# Patient Record
Sex: Male | Born: 1950 | Race: Black or African American | Hispanic: No | Marital: Married | Smoking: Former smoker
Health system: Southern US, Community
[De-identification: ages and names within clinical notes are randomized; demographics above are authoritative.]

## PROBLEM LIST (undated history)

## (undated) DIAGNOSIS — M869 Osteomyelitis, unspecified: Secondary | ICD-10-CM

## (undated) DIAGNOSIS — E11621 Type 2 diabetes mellitus with foot ulcer: Secondary | ICD-10-CM

## (undated) DIAGNOSIS — L089 Local infection of the skin and subcutaneous tissue, unspecified: Secondary | ICD-10-CM

## (undated) DIAGNOSIS — H409 Unspecified glaucoma: Secondary | ICD-10-CM

## (undated) DIAGNOSIS — I959 Hypotension, unspecified: Secondary | ICD-10-CM

## (undated) DIAGNOSIS — R519 Headache, unspecified: Secondary | ICD-10-CM

## (undated) DIAGNOSIS — E1142 Type 2 diabetes mellitus with diabetic polyneuropathy: Secondary | ICD-10-CM

## (undated) DIAGNOSIS — L97509 Non-pressure chronic ulcer of other part of unspecified foot with unspecified severity: Secondary | ICD-10-CM

## (undated) DIAGNOSIS — H269 Unspecified cataract: Secondary | ICD-10-CM

## (undated) DIAGNOSIS — I10 Essential (primary) hypertension: Secondary | ICD-10-CM

## (undated) DIAGNOSIS — I429 Cardiomyopathy, unspecified: Secondary | ICD-10-CM

## (undated) DIAGNOSIS — R251 Tremor, unspecified: Secondary | ICD-10-CM

## (undated) DIAGNOSIS — R51 Headache: Secondary | ICD-10-CM

## (undated) DIAGNOSIS — N186 End stage renal disease: Secondary | ICD-10-CM

## (undated) DIAGNOSIS — E11628 Type 2 diabetes mellitus with other skin complications: Secondary | ICD-10-CM

## (undated) DIAGNOSIS — I739 Peripheral vascular disease, unspecified: Secondary | ICD-10-CM

## (undated) HISTORY — DX: Peripheral vascular disease, unspecified: I73.9

## (undated) HISTORY — PX: OTHER SURGICAL HISTORY: SHX169

## (undated) HISTORY — PX: CHOLECYSTECTOMY: SHX55

## (undated) HISTORY — PX: EYE SURGERY: SHX253

## (undated) HISTORY — DX: Non-pressure chronic ulcer of other part of unspecified foot with unspecified severity: L97.509

## (undated) HISTORY — DX: Type 2 diabetes mellitus with diabetic polyneuropathy: E11.42

## (undated) HISTORY — DX: Tremor, unspecified: R25.1

## (undated) HISTORY — DX: Type 2 diabetes mellitus with foot ulcer: E11.621

## (undated) HISTORY — DX: End stage renal disease: N18.6

## (undated) HISTORY — DX: Cardiomyopathy, unspecified: I42.9

## (undated) HISTORY — PX: TONSILLECTOMY: SUR1361

---

## 1998-11-27 ENCOUNTER — Encounter: Admission: RE | Admit: 1998-11-27 | Discharge: 1999-02-25 | Payer: Self-pay | Admitting: Family Medicine

## 1999-12-08 ENCOUNTER — Encounter: Admission: RE | Admit: 1999-12-08 | Discharge: 2000-03-07 | Payer: Self-pay | Admitting: Orthopedic Surgery

## 2002-07-31 ENCOUNTER — Encounter (HOSPITAL_BASED_OUTPATIENT_CLINIC_OR_DEPARTMENT_OTHER): Admission: RE | Admit: 2002-07-31 | Discharge: 2002-10-29 | Payer: Self-pay | Admitting: Internal Medicine

## 2002-08-02 ENCOUNTER — Encounter (HOSPITAL_BASED_OUTPATIENT_CLINIC_OR_DEPARTMENT_OTHER): Payer: Self-pay | Admitting: Internal Medicine

## 2002-08-02 ENCOUNTER — Encounter: Admission: RE | Admit: 2002-08-02 | Discharge: 2002-08-02 | Payer: Self-pay | Admitting: Internal Medicine

## 2002-08-10 ENCOUNTER — Ambulatory Visit (HOSPITAL_COMMUNITY): Admission: RE | Admit: 2002-08-10 | Discharge: 2002-08-10 | Payer: Self-pay | Admitting: Internal Medicine

## 2002-08-10 ENCOUNTER — Encounter (HOSPITAL_BASED_OUTPATIENT_CLINIC_OR_DEPARTMENT_OTHER): Payer: Self-pay | Admitting: Internal Medicine

## 2004-05-27 ENCOUNTER — Ambulatory Visit (HOSPITAL_COMMUNITY): Admission: RE | Admit: 2004-05-27 | Discharge: 2004-05-27 | Payer: Self-pay | Admitting: Family Medicine

## 2004-08-05 ENCOUNTER — Encounter (HOSPITAL_BASED_OUTPATIENT_CLINIC_OR_DEPARTMENT_OTHER): Admission: RE | Admit: 2004-08-05 | Discharge: 2004-09-30 | Payer: Self-pay | Admitting: Internal Medicine

## 2004-08-05 ENCOUNTER — Encounter: Admission: RE | Admit: 2004-08-05 | Discharge: 2004-08-05 | Payer: Self-pay | Admitting: Internal Medicine

## 2004-10-28 ENCOUNTER — Encounter (HOSPITAL_BASED_OUTPATIENT_CLINIC_OR_DEPARTMENT_OTHER): Admission: RE | Admit: 2004-10-28 | Discharge: 2004-11-30 | Payer: Self-pay | Admitting: Internal Medicine

## 2005-01-20 ENCOUNTER — Encounter (HOSPITAL_BASED_OUTPATIENT_CLINIC_OR_DEPARTMENT_OTHER): Admission: RE | Admit: 2005-01-20 | Discharge: 2005-03-26 | Payer: Self-pay | Admitting: Internal Medicine

## 2005-11-25 ENCOUNTER — Encounter (HOSPITAL_BASED_OUTPATIENT_CLINIC_OR_DEPARTMENT_OTHER): Admission: RE | Admit: 2005-11-25 | Discharge: 2006-02-21 | Payer: Self-pay | Admitting: Surgery

## 2006-02-23 ENCOUNTER — Encounter (HOSPITAL_BASED_OUTPATIENT_CLINIC_OR_DEPARTMENT_OTHER): Admission: RE | Admit: 2006-02-23 | Discharge: 2006-03-01 | Payer: Self-pay | Admitting: Surgery

## 2008-05-24 ENCOUNTER — Ambulatory Visit (HOSPITAL_COMMUNITY): Admission: RE | Admit: 2008-05-24 | Discharge: 2008-05-24 | Payer: Self-pay | Admitting: Family Medicine

## 2008-06-24 ENCOUNTER — Encounter (INDEPENDENT_AMBULATORY_CARE_PROVIDER_SITE_OTHER): Payer: Self-pay | Admitting: General Surgery

## 2008-06-24 ENCOUNTER — Ambulatory Visit (HOSPITAL_COMMUNITY): Admission: RE | Admit: 2008-06-24 | Discharge: 2008-06-24 | Payer: Self-pay | Admitting: General Surgery

## 2010-08-11 ENCOUNTER — Encounter (HOSPITAL_BASED_OUTPATIENT_CLINIC_OR_DEPARTMENT_OTHER): Admission: RE | Admit: 2010-08-11 | Discharge: 2010-09-08 | Payer: Self-pay | Admitting: General Surgery

## 2010-08-12 ENCOUNTER — Ambulatory Visit (HOSPITAL_COMMUNITY): Admission: RE | Admit: 2010-08-12 | Discharge: 2010-08-12 | Payer: Self-pay | Admitting: General Surgery

## 2011-05-11 NOTE — Consult Note (Signed)
NAME:  [name], [name]               ACCOUNT NO.:  [id number]   MEDICAL RECORD NO.:  [id number]          PATIENT TYPE:  OUT   LOCATION:  XRAY                         FACILITY:  [facility]   PHYSICIAN:  Mare Loan, MD      DATE OF BIRTH:  [birth year]   DATE OF CONSULTATION:  05/24/2008  DATE OF DISCHARGE:                                 CONSULTATION   REASON FOR CONSULTATION:  Cholelithiasis, cholecystitis.   [name] [name] is a 60 year old male whom I am asked to see by [place].  The PA is Westly Pam.  [name] [name] apparently began having  abdominal pain last Saturday.  The pain was intermittent in nature,  mostly was located beneath his right scapula.  The pain did radiate  around to his right upper quadrant and epigastric region.  He believed  this to be a muscle strain and ignored this.  He then continued to have  worsening pain and over the past couple of days the pain intensified to  where he went to his physician for further examination.  He had had  associated nausea and vomiting and chills.  No scleral icterus and no  skin changes.  He has had no diarrhea.  He  has had some constipation  and difficulty passing flatus.  He has a history of gallstones in the  past and had seen Dr. Lennie Hummer.  Apparently had a repeat ultrasound  done today with gallbladder wall thickening, no pericholecystic fluid,  no ductal dilatation.  He had labs done with his primary care physician  but I do not have these available to me.   PAST MEDICAL HISTORY:  Significant for hypertension, diabetes, mild  anemia.   MEDICATIONS:  Include Actos as well as Prinivil, Januvia and Pepcid.   He does smoke tobacco and rarely has alcohol intake.   SURGICAL HISTORY:  None.   REVIEW OF SYSTEMS:  No cardiac disease.  No pulmonary disease.  No renal  disease.  He does have diabetes, for which he has seen an  endocrinologist in the past.   PHYSICAL EXAMINATION:  GENERAL:  He is a pleasant male, appears in  no  acute distress.  HEENT:  Extraocular muscles are intact.  Anicteric.  CHEST:  Clear to auscultation bilaterally.  CARDIOVASCULAR:  Shows a regular rate and rhythm.  ABDOMEN:  He is mildly distended.  He is somewhat tender in the right  upper quadrant but no peritoneal signs.  EXTREMITIES:  No deformity or edema noted.  SKIN:  I notice no rashes, no skin lesions.   ASSESSMENT/PLAN:  Cholelithiasis and likely cholecystitis.  I discussed  with [name] [name] that I could either admit him to the hospital with IV  fluids, IV antibiotics and pain control or we could try outpatient  management with narcotics and antibiotics orally.  He currently is in no  acute distress and would like to try conservative management first.  I  have given him a prescription for Cipro as well as Percocet, instructed  him if he does worsen and does not improve he should come  back to the  hospital.  My goal is to let some of the inflammation calm down.  Since  it has been greater than 72 hours since the onset of abdominal pain, I  think the likelihood of having to convert to an open procedure would be  more likely and I would like to give him a better chance at a  laparoscopic procedure, but he  understands if he does worsen or has more severe symptoms that we may  have to go ahead and perform a cholecystectomy, and therefore he will be  in touch with me if things worsen over the weekend, or he will call my  office to schedule an appointment as an outpatient Monday.      Mare Loan, MD  Electronically Signed     ALA/MEDQ  D:  05/24/2008  T:  05/24/2008  Job:  [id number]   cc:   [facility]

## 2011-05-11 NOTE — Op Note (Signed)
NAME:  [name], [name]               ACCOUNT NO.:  [id number]   MEDICAL RECORD NO.:  [id number]          PATIENT TYPE:  AMB   LOCATION:  DAY                          FACILITY:  [facility]   PHYSICIAN:  Mare Loan, MD      DATE OF BIRTH:  1951-11-07   DATE OF PROCEDURE:  06/24/2008  DATE OF DISCHARGE:                               OPERATIVE REPORT   PREOPERATIVE DIAGNOSES:  Chronic cholecystitis.  Cholelithiasis.   POSTOPERATIVE DIAGNOSES:  Chronic cholecystitis.  Cholelithiasis.   SURGEON:  Mare Loan, M.D.   ASSISTANT:  None.   ANESTHESIA:  General endotracheal anesthesia.   PROCEDURE:  Laparoscopic cholecystectomy.   SPECIMENS:  Gallbladder.   COMPLICATIONS:  None.   DRAINS:  No drains were placed.   INDICATIONS FOR PROCEDURE:  [name] is a 60 year old male whom I had  seen in May due to the acute onset of abdominal pain.  He had  cholecystitis, but I felt at that time he had missed his operative  window.  I placed him on oral antibiotics and scheduled him for a later  date to reduce the chance of an open procedure.  An informed consent was  obtained prior to the procedure.   DESCRIPTION OF PROCEDURE:  [name] was identified in the preoperative  holding area and received 2 grams of Mefoxin and was taken to the  operating room.  Once in the operating room, he was placed in the supine  position.  After the administration of general endotracheal anesthesia,  his abdomen was prepped and draped in the usual sterile fashion.  A time  out in the procedure indicated the patient was to have the procedure  performed and a supraumbilical incision was placed.  A supraumbilical  incision was placed.  The Veress needle was placed into the abdominal  cavity to obtain a pneumoperitoneum.  After establishment of the  pneumoperitoneum, I placed an 11 mm trocar using Opti-View.  There was  no evidence of injury upon placement of the Veress needle or the trocar.  I then placed three  additional 5 mm trocars, one in the epigastric  region, two in the right side of the abdomen under visualization with  the camera.  The gallbladder was visualized in its normal anatomic  position in the gallbladder fossa.  The fundus of the gallbladder was  retracted up towards the head of the patient.  I retracted the  infundibulum away from the liver bed.  Using Maryland forceps and hook  electrocautery in the left lower quadrant, I dissected the peritoneum at  the infundibulum.  I was able to obtain a critical view of the cystic  duct, cystic artery and liver bed.  Three clips were placed proximally  on the cystic duct, one distally.  It was transected with laparoscopic  scissors.  I also clipped and divided the cystic artery.  I then removed  the remaining peritoneal attachments with the help of electrocautery.  A  Ray-Tec was introduced into the abdominal cavity and gently swept away  the mild ooze at the liver bed.  Once the  gallbladder was removed, it  was placed in an Endo-Catch bag and removed from the abdomen.  I  inspected the liver bed.  A minimal amount of oozing was easily  controlled with electrocautery.  The Ray-Tec was then removed from the  abdomen.  In the infraumbilical incision the fascia was closed with #0  Vicryl suture.  The skin was closed with #4-0 Monocryl.  A DermaMend  dressing.   The patient was then extubated and transported to the post-anesthesia  unit in stable condition.  He will likely be discharged home later  today.  He will get Percocet #45 for pain.  He is instructed no heavy  lifting of greater than 20 pounds for approximately five weeks.  He is  to follow up with me in approximately two to three weeks' time.      Mare Loan, MD  Electronically Signed     ALA/MEDQ  D:  06/24/2008  T:  06/24/2008  Job:  [id number]   cc:   [facility]

## 2011-05-14 NOTE — Consult Note (Signed)
NAME:  [name], [name]                         ACCOUNT NO.:  [id number]   MEDICAL RECORD NO.:  [id number]                   PATIENT TYPE:  REC   LOCATION:  FOOT                                 FACILITY:  [facility]   PHYSICIAN:  Orlando Penner. Sevier, M.D.              DATE OF BIRTH:  08-06-1951   DATE OF CONSULTATION:  08/02/2002  DATE OF DISCHARGE:                            ENDOCRINOLOGY CONSULTATION   HISTORY:  This 60 year old white male has a history of approximately 10  years of type 2 diabetes, treated initially with Micronase then Glucophage  (which was poorly tolerated), and eventually at present only Actos.  Apparently his degree of control is dismal, with recent hemoglobin A1C 11.9  on July 20, 2002.   With that background history, the patient has had some callous formation on  his feet for some period of time, but this has not ever been particularly  problematic.  He has noted that his feet have markedly decreased sensation.  In July 2003 he apparently stumped his left great toe on a chair in the  bedroom, and subsequently came to find out that in fact the toe had been  fractured.  There was some apparent dorsal laceration at that time as well;  which became secondary to the fact that he required antibiotic treatment  over a period of time.  Subsequent to that he has had some deformity of the  toe and a plantar callous on that toe has gotten progressively worse.   He is here now for a general evaluation and advice.   PAST MEDICAL HISTORY:  1. Hypertension.  2. History of mild anemia.  3. Diabetes.   ALLERGIES:  PENICILLIN.   MEDICATIONS:  Lisinopril, Actos.   PHYSICAL EXAMINATION:  Examination today is limited to the distal lower  extremities.  His feet are grossly free of edema, and showed bilaterally  some early hammer toe configuration of the second through the fourth digits.  The hallux on the left is somewhat deformed, secondary to the previous  trauma -- being  slightly in a valgus position and lacking mobility in a  phalangeal joint.  The skin temperatures of the feet are normal on the  right, but approximately 3-4 degrees higher at the corresponding position  throughout the left foot; which is thought to be abnormal.  This foot feels  warm as well.  All pulses are palpable and quite adequate.  Monofilament  testing shows an absence of effective sensation over the toes and metatarsal  head bilaterally.   In the plantar aspect of the mid arch of the left foot are some discolored  and somewhat hardened areas, a bit flaky, that would appear to be indicative  of some degree of superficial fungus infection.  There is callous formation  at the tip of the right second toe at the right fifth and first metatarsal  head's plantar aspects.  There is major callous formation  at the tip of the  left hallux and underlying the proximal phalanx of the left hallux.  There  is an addition plantar callous formation at the first and fifth metatarsal  heads on the left.  Finally, on the dorsal lateral aspect at the base of the  fifth toe on the left, overlying the metatarsal phalangeal joint is again  callous formation.   DISPOSITION:  1. The patient is given general instruction regarding foot care, foot wear     and diabetes by video, with nursing potential reinforcement.  2. The patient is particularly advised that it is critical that he gets his     diabetes under better control, both to allow management of his current     foot problems and also to lessen his tendency toward problems in the     future.  3. I additionally explained to the patient that the warmth in his left foot     is a matter of considerable concern, this concern being he may well have     some persistent osteomyelitis from his bout of infection following his     January trauma; and/or that he may have the beginnings of a Charcot     process.  Certainly the osteomyelitis is somewhat more  suspect.  4. The callouses and all of their formation locations are sharply repaired     without incident.  5. The patient is given a recommendation to use Lamisil cream on the area of     fungal infection and the arch of the left foot.  6. The patient is given prescription for custom molded inserts; added with     recommendation that a mild exterior rocker be added in to prevent future     toe tip callouses and association of risks of progressive in her toes.  7. Finally, the patient is sent for x-ray of the left foot today.  If this     is unrevealing, a three-phase bone scan will be done.  8. Return visit is given to the patient for one month.                                               Orlando Penner. London Pepper, M.D.    RES/MEDQ  D:  08/02/2002  T:  08/06/2002  Job:  [id number]   cc:   Azalia Bilis

## 2011-08-10 ENCOUNTER — Inpatient Hospital Stay (HOSPITAL_COMMUNITY): Payer: Self-pay

## 2011-08-10 ENCOUNTER — Inpatient Hospital Stay (HOSPITAL_COMMUNITY)
Admission: AD | Admit: 2011-08-10 | Discharge: 2011-08-16 | DRG: 617 | Disposition: A | Discharge status: Home / Self Care | Payer: Self-pay | Source: Ambulatory Visit | Attending: Internal Medicine | Admitting: Internal Medicine

## 2011-08-10 DIAGNOSIS — I96 Gangrene, not elsewhere classified: Secondary | ICD-10-CM | POA: Diagnosis present

## 2011-08-10 DIAGNOSIS — J4489 Other specified chronic obstructive pulmonary disease: Secondary | ICD-10-CM | POA: Diagnosis present

## 2011-08-10 DIAGNOSIS — J449 Chronic obstructive pulmonary disease, unspecified: Secondary | ICD-10-CM | POA: Diagnosis present

## 2011-08-10 DIAGNOSIS — M869 Osteomyelitis, unspecified: Secondary | ICD-10-CM | POA: Diagnosis present

## 2011-08-10 DIAGNOSIS — E46 Unspecified protein-calorie malnutrition: Secondary | ICD-10-CM | POA: Diagnosis present

## 2011-08-10 DIAGNOSIS — L97509 Non-pressure chronic ulcer of other part of unspecified foot with unspecified severity: Secondary | ICD-10-CM | POA: Diagnosis present

## 2011-08-10 DIAGNOSIS — M7020 Olecranon bursitis, unspecified elbow: Secondary | ICD-10-CM | POA: Diagnosis present

## 2011-08-10 DIAGNOSIS — I10 Essential (primary) hypertension: Secondary | ICD-10-CM | POA: Diagnosis present

## 2011-08-10 DIAGNOSIS — L02619 Cutaneous abscess of unspecified foot: Secondary | ICD-10-CM | POA: Diagnosis present

## 2011-08-10 DIAGNOSIS — E1169 Type 2 diabetes mellitus with other specified complication: Principal | ICD-10-CM | POA: Diagnosis present

## 2011-08-10 DIAGNOSIS — E1142 Type 2 diabetes mellitus with diabetic polyneuropathy: Secondary | ICD-10-CM | POA: Diagnosis present

## 2011-08-10 DIAGNOSIS — IMO0002 Reserved for concepts with insufficient information to code with codable children: Principal | ICD-10-CM | POA: Diagnosis present

## 2011-08-10 DIAGNOSIS — M9080 Osteopathy in diseases classified elsewhere, unspecified site: Secondary | ICD-10-CM | POA: Diagnosis present

## 2011-08-10 DIAGNOSIS — E1149 Type 2 diabetes mellitus with other diabetic neurological complication: Secondary | ICD-10-CM | POA: Diagnosis present

## 2011-08-10 DIAGNOSIS — L03039 Cellulitis of unspecified toe: Secondary | ICD-10-CM | POA: Diagnosis present

## 2011-08-10 DIAGNOSIS — Z794 Long term (current) use of insulin: Secondary | ICD-10-CM

## 2011-08-10 DIAGNOSIS — D638 Anemia in other chronic diseases classified elsewhere: Secondary | ICD-10-CM | POA: Diagnosis present

## 2011-08-10 LAB — CBC
HCT: 31.7 % — ABNORMAL LOW (ref 39.0–52.0)
Hemoglobin: 10.6 g/dL — ABNORMAL LOW (ref 13.0–17.0)
MCH: 28.4 pg (ref 26.0–34.0)
MCHC: 33.4 g/dL (ref 30.0–36.0)
MCV: 85 fL (ref 78.0–100.0)

## 2011-08-10 LAB — RAPID URINE DRUG SCREEN, HOSP PERFORMED
Amphetamines: NOT DETECTED
Barbiturates: NOT DETECTED
Benzodiazepines: NOT DETECTED
Tetrahydrocannabinol: NOT DETECTED

## 2011-08-10 LAB — URINALYSIS, ROUTINE W REFLEX MICROSCOPIC
Ketones, ur: NEGATIVE mg/dL
Leukocytes, UA: NEGATIVE
Nitrite: NEGATIVE
pH: 5.5 (ref 5.0–8.0)

## 2011-08-10 LAB — LIPID PANEL
HDL: 29 mg/dL — ABNORMAL LOW (ref >39)
LDL Cholesterol: 106 mg/dL — ABNORMAL HIGH (ref 0–99)

## 2011-08-10 LAB — COMPREHENSIVE METABOLIC PANEL
ALT: 20 U/L (ref 0–53)
Alkaline Phosphatase: 74 U/L (ref 39–117)
CO2: 27 mEq/L (ref 19–32)
Chloride: 98 mEq/L (ref 96–112)
GFR calc Af Amer: >60 mL/min (ref >60)
GFR calc non Af Amer: 53 mL/min — ABNORMAL LOW (ref >60)
Glucose, Bld: 259 mg/dL — ABNORMAL HIGH (ref 70–99)
Potassium: 3.7 mEq/L (ref 3.5–5.1)
Sodium: 133 mEq/L — ABNORMAL LOW (ref 135–145)
Total Protein: 8.1 g/dL (ref 6.0–8.3)

## 2011-08-10 LAB — URINE MICROSCOPIC-ADD ON

## 2011-08-10 LAB — GLUCOSE, CAPILLARY: Glucose-Capillary: 179 mg/dL — ABNORMAL HIGH (ref 70–99)

## 2011-08-11 DIAGNOSIS — M869 Osteomyelitis, unspecified: Secondary | ICD-10-CM

## 2011-08-11 LAB — GLUCOSE, CAPILLARY
Glucose-Capillary: 203 mg/dL — ABNORMAL HIGH (ref 70–99)
Glucose-Capillary: 178 mg/dL — ABNORMAL HIGH (ref 70–99)

## 2011-08-11 LAB — DIFFERENTIAL
Basophils Relative: 0 % (ref 0–1)
Eosinophils Relative: 9 % — ABNORMAL HIGH (ref 0–5)
Lymphocytes Relative: 23 % (ref 12–46)
Monocytes Relative: 14 % — ABNORMAL HIGH (ref 3–12)
Neutro Abs: 4.8 10*3/uL (ref 1.7–7.7)

## 2011-08-11 LAB — CBC
Hemoglobin: 9.7 g/dL — ABNORMAL LOW (ref 13.0–17.0)
MCH: 28.2 pg (ref 26.0–34.0)
MCV: 84.9 fL (ref 78.0–100.0)
RBC: 3.44 MIL/uL — ABNORMAL LOW (ref 4.22–5.81)
WBC: 9 10*3/uL (ref 4.0–10.5)

## 2011-08-11 LAB — IRON AND TIBC
Iron: 19 ug/dL — ABNORMAL LOW (ref 42–135)
TIBC: 181 ug/dL — ABNORMAL LOW (ref 215–435)

## 2011-08-11 LAB — TSH: TSH: 1.184 u[IU]/mL (ref 0.350–4.500)

## 2011-08-11 LAB — BASIC METABOLIC PANEL
CO2: 27 mEq/L (ref 19–32)
Chloride: 102 mEq/L (ref 96–112)
Glucose, Bld: 146 mg/dL — ABNORMAL HIGH (ref 70–99)
Potassium: 3.6 mEq/L (ref 3.5–5.1)
Sodium: 136 mEq/L (ref 135–145)

## 2011-08-11 LAB — VITAMIN B12: Vitamin B-12: 997 pg/mL — ABNORMAL HIGH (ref 211–911)

## 2011-08-11 LAB — FOLATE: Folate: 16 ng/mL

## 2011-08-11 LAB — HEMOGLOBIN A1C: Mean Plasma Glucose: 260 mg/dL — ABNORMAL HIGH (ref <117)

## 2011-08-11 MED ORDER — GADOBENATE DIMEGLUMINE 529 MG/ML IV SOLN
15.0000 mL | Freq: Once | INTRAVENOUS | Status: AC
  Administered 2011-08-10: 15 mL via INTRAVENOUS

## 2011-08-12 DIAGNOSIS — M869 Osteomyelitis, unspecified: Secondary | ICD-10-CM

## 2011-08-12 DIAGNOSIS — M79609 Pain in unspecified limb: Secondary | ICD-10-CM

## 2011-08-12 DIAGNOSIS — Z01810 Encounter for preprocedural cardiovascular examination: Secondary | ICD-10-CM

## 2011-08-12 LAB — GLUCOSE, CAPILLARY: Glucose-Capillary: 150 mg/dL — ABNORMAL HIGH (ref 70–99)

## 2011-08-12 LAB — CBC
HCT: 30.8 % — ABNORMAL LOW (ref 39.0–52.0)
Hemoglobin: 10.1 g/dL — ABNORMAL LOW (ref 13.0–17.0)
MCH: 28 pg (ref 26.0–34.0)
MCV: 85.3 fL (ref 78.0–100.0)
Platelets: 272 10*3/uL (ref 150–400)
RBC: 3.27 MIL/uL — ABNORMAL LOW (ref 4.22–5.81)
RBC: 3.61 MIL/uL — ABNORMAL LOW (ref 4.22–5.81)
RDW: 13.9 % (ref 11.5–15.5)
WBC: 7.3 10*3/uL (ref 4.0–10.5)

## 2011-08-12 LAB — BASIC METABOLIC PANEL
CO2: 28 mEq/L (ref 19–32)
Calcium: 8.8 mg/dL (ref 8.4–10.5)
Calcium: 9.5 mg/dL (ref 8.4–10.5)
Creatinine, Ser: 1.07 mg/dL (ref 0.50–1.35)
GFR calc Af Amer: >60 mL/min (ref >60)
GFR calc non Af Amer: >60 mL/min (ref >60)
Glucose, Bld: 122 mg/dL — ABNORMAL HIGH (ref 70–99)
Sodium: 140 mEq/L (ref 135–145)

## 2011-08-12 LAB — VANCOMYCIN, TROUGH: Vancomycin Tr: 18.8 ug/mL (ref 10.0–20.0)

## 2011-08-13 ENCOUNTER — Inpatient Hospital Stay (HOSPITAL_COMMUNITY): Payer: Self-pay

## 2011-08-13 LAB — GLUCOSE, CAPILLARY
Glucose-Capillary: 120 mg/dL — ABNORMAL HIGH (ref 70–99)
Glucose-Capillary: 114 mg/dL — ABNORMAL HIGH (ref 70–99)

## 2011-08-13 NOTE — H&P (Signed)
NAMEMOBEEN, KUTZNER NO.:  [id number]  MEDICAL RECORD NO.:  [id number]  LOCATION:  [id number]                         FACILITY:  [facility]  PHYSICIAN:  Oren Binet, MD    DATE OF BIRTH:  [birth year]  DATE OF ADMISSION:  08/10/2011 DATE OF DISCHARGE:                             HISTORY & PHYSICAL   PRIMARY CARE PRACTITIONER:  Azalia Bilis, M.D.  PRIMARY ORTHOPEDIC DOCTOR:  [name], M.D.  CHIEF COMPLAINT:  Sent from Dr. [name]' office for left fourth toe osteomyelitis along with uncontrolled hypertension.  HISTORY OF PRESENT ILLNESS:  Mr. Latter is a very pleasant 60 year old black male with a past medical history of diabetes and hypertension who unfortunately has been noncompliant to his medications because of financial issues presented to the [facility] with the above- noted complaints.  Per the history obtained around 2-3 weeks ago, the patient hit his left foot and sustained a minor trauma.  There, are over the past few weeks, he has noticed that the foot has become more swollen and over the past few days, it has started to smell with a foul-smelling odor along with a blackish discoloration.  He presented to his primary care practitioner's office few days ago and was then referred to Dr. [name]' office for further evaluation.  In Dr. [name]' office the patient was found to have a blood pressure with the systolic in the [id number] and hence referred to the Medical Service for further optimization of his medical issues, so that the patient could undergo eventual amputation.  The patient also does give a history of subjective fever. However, does not complain of headache, nausea, vomiting, diarrhea, or abdominal pain.  ALLERGIES:  The patient is apparently allergic to PENICILLIN for which he gets hives and swells up.  PAST MEDICAL HISTORY:  Hypertension and diabetes.  PAST SURGICAL HISTORY: 1. Cholecystectomy. 2. Hysterectomy. 3. Right leg  grafting.  HOME MEDICATIONS:  The patient has not been taking any of his blood pressure medications or diabetic medications for more than a year now.  FAMILY HISTORY:  He has diabetes both in his mother and father.  SOCIAL HISTORY:  The patient lives with his wife and denies any toxic habits.  He used to smoke, but quit in the 1980s.  REVIEW OF SYSTEMS:  A detailed review of 12 systems was done and these are negative except for the ones noted in the HPI.  PHYSICAL EXAMINATION:  GENERAL:  Lying in bed, does not appear to be in any respiratory distress.  He is awake and alert, talking in full sentences.  Speech is clear. VITAL SIGNS:  Temperature of 99.6, heart rate of 104, respiration of 20, blood pressure of 194/76, and a pulse ox of 100% on room air. HEENT:  Atraumatic, normocephalic.  Pupils are equally reactive to light and accommodation. CHEST:  Bilaterally clear. CARDIOVASCULAR:  Heart sounds are regular.  No murmurs heard. ABDOMEN:  Soft, nontender, nondistended. EXTREMITIES:  No edema in his bilateral lower extremity.  However, his left fourth toe has purulent smell, has blackish discoloration.  It is mildly tender as well. NEUROLOGIC:  The patient is awake and alert and does not appear to  have any focal neurological deficits.  LABORATORY DATA: 1. CBC shows a WBC of 11.7, hemoglobin of 10.6, hematocrit of 31.7,     MCV of 85, and a platelet count of 263. 2. INR is 1.18. 3. Chemistry shows a sodium of 133, potassium of 3.7, chloride of 98,     bicarb of 27, glucose of 259, BUN of 18, creatinine of 1.37, and a     calcium of 9.1. 4. LFT shows a total bilirubin of 0.3, alkaline phosphatase of 74, AST     of 21, ALT of 20, total protein of 8.1, and an albumin of 2.8. 5. ESR is 106.  RADIOLOGICAL STUDIES:  MRI of the left foot is currently pending.  ASSESSMENT: 1. Likely left foot ulceration and early gangrenous changes with     likely osteomyelitis. 2. Uncontrolled  diabetes. 3. Uncontrolled hypertension. 4. Anemia, possibly secondary to chronic disease. 5. Protein-calorie malnutrition given a low albumin levels.  PLAN: 1. The patient will be admitted to a regular medical bed. 2. We will empirically start him on IV vancomycin, ciprofloxacin, and     Flagyl. 3. The patient will currently undergo MRI of his leg as ordered by his     primary orthopedic surgeon. 4. In regards to his hypertension, we will go ahead and start him on     lisinopril and use hydralazine on a p.r.n. basis. 5. We will go ahead and get HbA1c fasting lipid profile as well. 6. Further plan will depend as the patient's clinical course evolves. 7. In regards to his diabetes, we will start him on low-dose of Lantus     and continue his sliding scale insulin as already ordered. 8. We will check his HbA1c and decide whether he needs long term     insulin coverage or could be safely discharged on oral hypoglycemic     agents. 9. Orthopedic followup will probably be obtained tomorrow. 10.Per my conversation with Dr. [name], he tentatively plan to     amputate the toe sometime on Friday. 11.DVT prophylaxis with Lovenox for now.  Total time spent for admission 45 minutes.     Oren Binet, MD     SG/MEDQ  D:  08/10/2011  T:  08/10/2011  Job:  [id number]  cc:   Azalia Bilis, M.D. [name], M.D.  Electronically Signed by Oren Binet  on 08/13/2011 02:33:11 PM

## 2011-08-14 LAB — BASIC METABOLIC PANEL
BUN: 10 mg/dL (ref 6–23)
CO2: 25 mEq/L (ref 19–32)
Calcium: 9.1 mg/dL (ref 8.4–10.5)
Creatinine, Ser: 1.23 mg/dL (ref 0.50–1.35)
Glucose, Bld: 117 mg/dL — ABNORMAL HIGH (ref 70–99)

## 2011-08-14 LAB — GLUCOSE, CAPILLARY: Glucose-Capillary: 117 mg/dL — ABNORMAL HIGH (ref 70–99)

## 2011-08-14 LAB — CBC
Hemoglobin: 9.6 g/dL — ABNORMAL LOW (ref 13.0–17.0)
MCH: 27.8 pg (ref 26.0–34.0)
MCV: 85.2 fL (ref 78.0–100.0)
RBC: 3.45 MIL/uL — ABNORMAL LOW (ref 4.22–5.81)

## 2011-08-15 LAB — GLUCOSE, CAPILLARY
Glucose-Capillary: 173 mg/dL — ABNORMAL HIGH (ref 70–99)
Glucose-Capillary: 190 mg/dL — ABNORMAL HIGH (ref 70–99)

## 2011-08-16 LAB — CBC
HCT: 29 % — ABNORMAL LOW (ref 39.0–52.0)
Hemoglobin: 9.3 g/dL — ABNORMAL LOW (ref 13.0–17.0)
MCH: 27.6 pg (ref 26.0–34.0)
MCHC: 32.1 g/dL (ref 30.0–36.0)
RBC: 3.37 MIL/uL — ABNORMAL LOW (ref 4.22–5.81)

## 2011-08-16 LAB — GLUCOSE, CAPILLARY: Glucose-Capillary: 98 mg/dL (ref 70–99)

## 2011-08-16 LAB — BASIC METABOLIC PANEL
BUN: 18 mg/dL (ref 6–23)
CO2: 27 mEq/L (ref 19–32)
GFR calc non Af Amer: 49 mL/min — ABNORMAL LOW (ref >60)
Glucose, Bld: 129 mg/dL — ABNORMAL HIGH (ref 70–99)
Potassium: 4.1 mEq/L (ref 3.5–5.1)

## 2011-08-18 LAB — TISSUE CULTURE

## 2011-08-18 NOTE — Discharge Summary (Signed)
[name], [name] NO.:  [id number]  MEDICAL RECORD NO.:  [id number]  LOCATION:  [id number]                         FACILITY:  [facility]  PHYSICIAN:  Sherryl Manges, M.D.  DATE OF BIRTH:  [birth year]  DATE OF ADMISSION:  08/10/2011 DATE OF DISCHARGE:  08/16/2011                              DISCHARGE SUMMARY   PRIMARY MD:  Azalia Bilis, MD  PRIMARY ORTHOPEDIC SURGEON:  [name], MD  DISCHARGE DIAGNOSES: 1. Gangrene/osteomyelitis fourth toe of left foot, status post amputation     on August 13, 2011. 2. Uncontrolled hypertension. 3. Type 2 diabetes mellitus, insulin-requiring. 4. Anemia of chronic disease.  DISCHARGE MEDICATIONS: 1. Amlodipine 10 mg p.o. daily. 2. Clonidine 0.2 mg p.o. t.i.d. 3. Ciprofloxacin 500 mg p.o. b.i.d. for 28 days. 4. Hydrocodone/APAP (5/325) one p.o. p.r.n. q.4 hourly for pain, a     total of 6 pills have been dispensed. 5. NovoLog insulin per sliding scale as follows CBG 70-120, no     insulin; CBG 121-150, 2 units; CBG 151-200, 3 units; CBG 201 to     250, 5 units; CBG 251-300, 18 units; CBG 301-350, 11 units; CBG 351-     400, 15 units. 6. Lisinopril 20 mg p.o. daily. 7. Metronidazole 500 mg p.o. t.i.d. for 28 days. 8. NovoLog (70/30) 10 units subcutaneously b.i.d. at breakfast time     and dinner time. 9. Doxycycline 100 mg p.o. b.i.d. for 28 days.  PROCEDURES: 1. Chest x-ray on August 13, 2011, this showed no acute abnormalities. 2. X-ray, left foot on August 13, 2011, this showed bony     demineralization, prior osteonecrosis of the second metatarsal     head, old healed fracture proximal phalanx with grade 2, no     definite acute abnormalities. 3. MRI, left foot on August 11, 2011, this showed diffuse forefoot     cellulitis with suspicion of soft tissue emphysema in the fourth     toe, soft tissue abscess demonstrated there was no edema and     enhancement within the fourth metatarsal head and adjacent  proximal     phalangeal base, left foot osteomyelitis.  No other suspicious     findings. 4. X-ray right hand on August 13, 2011, this showed no acute osseous     findings.  CONSULTATIONS: 1. [name], MD, orthopedic surgeon. [place], MD, Infectious Disease specialist.  ADMISSION HISTORY:  As in H and P notes of August 10, 2011, dictated by Dr. Oren Binet.  However, in brief, this is a 60 year old male, with known history of type 2 diabetes mellitus, hypertension, noncompliant to medication secondary to financial issues, who had sustained minor trauma to his left foot approximately 2-3 weeks ago, followed by progressive swelling, emanation of a foul odor, along with persistent discoloration. He presented to primary MD's office a few days prior to hospitalization, was referred to Dr. [name]' office for further evaluation.  While in Dr. [name]' office, i.e., orthopedic surgeon, he was found to have a systolic blood pressure in the 190s.  He was therefore referred for admission to Medical Service for stabilization, prior to surgical management of his toe and was  admitted for further evaluation, investigation, and management.  CLINICAL COURSE: 1. Gangrene of fourth toe left foot/osteomyelitis.  The patient     presented as described above. Diagnosis was confirmed with imaging     studies, and orthopedic consultation was kindly provided by Dr. [name],     who carried out amputation of the metatarsophalangeal joints fourth     toe left foot, as well as the excision of the metatarsal head,     fourth toe left foot, on August 16, 0000000, in an uncomplicated     procedure.  The patient was commenced on broad-spectrum antibiotic     therapy.  ID consultation was kindly provided by Dr. Rhina Brackett     Dam.  The patient was therefore managed with a combination of     Flagyl, vancomycin, and quinolone.  However, by August 16, 2011,     clinical condition had  significantly improved.  Antibiotic therapy     has therefore been rationalized to monotherapy with doxycycline,     ciprofloxacin, and Flagyl for further 28 days of antibiotic     therapy.  2. Hypertension.  This was severe and uncontrolled at the time of     presentation.  The patient needed multiple antihypertensive     medications, however, we were able to adequately control his blood pressure.      Because of financial issues, we are attempting to use a cost-effective     combination of antihypertensives.  3. Type 2 diabetes mellitus.  This is now insulin-requiring.  The     patient was managed with sliding scale insulin coverage, scheduled     medium acting insulin, as well as appropriate diet.  4. Anemia of chronic disease.  The patient presented with a hemoglobin     of 9.7, as well as MCV of 84.9.  This is deemed to be anemia of     chronic disease and has remained stable. As a matter of fact as of     August 16, 2011, hemoglobin was 9.3.  DISPOSITION:  The patient on August 16, 2011, was deemed clinically stable.  He has been cleared by orthopedic surgeon for discharge and as described above, antibiotic therapy has been rationalized by Infectious Disease specialist, Dr. Roderic Scarce.  The patient was therefore discharged accordingly.  ACTIVITY:  As tolerated.  Recommended to increase activity slowly.  DIET:  Heart-healthy/carbohydrate modified.  FOLLOWUP INSTRUCTIONS:  The patient will follow up with Dr. [name], orthopedic surgeon in 8 days, telephone number [phone], as well as Dr. Rhina Brackett Dam, Infectious Disease specialist, telephone number [phone] in 4 weeks.  He is instructed to call for an appointment and has verbalized understanding.  The patient will likely continue to follow up with his primary MD, Dr. Azalia Bilis, however, he tells me that because of insurance issues, this appointment will only be able to be scheduled in January 2013. Attempts  were therefore being made by the care manager and clinical social worker to schedule an earlier appointment for the patient.     Sherryl Manges, M.D.     CO/MEDQ  D:  08/16/2011  T:  08/17/2011  Job:  [id number]  cc:   Azalia Bilis, M.D. [name], MD [name], M.D.  Electronically Signed by Sherryl Manges M.D. on 08/18/2011 11:14:22 PM

## 2011-09-02 NOTE — Consult Note (Signed)
[name], [name]               ACCOUNT NO.:  [id number]  MEDICAL RECORD NO.:  [id number]  LOCATION:  [id number]                         FACILITY:  [place]  PHYSICIAN:  [name], M.D.   DATE OF BIRTH:  Feb 07, 1951  DATE OF CONSULTATION:  08/10/2011 DATE OF DISCHARGE:                                CONSULTATION   REASON FOR CONSULTATION:  Evaluation of left foot infection.  PRIMARY CARE PHYSICIAN:  Azalia Bilis, MD, [facility]  BRIEF HISTORY:  [name] is a pleasant 60 year old black male who has a past medical history of type 2 diabetes mellitus and hypertension.  He unfortunately was out of work and was noncompliant due to his medications and financial situation and did not take any of his hypertensive or diabetes medication for quite sometime.  The patient was seen by Dr. Maury Dus yesterday in his office and was felt to be in need for orthopedic evaluation due to an obvious infection of his left fourth toe.  He was put on p.o. doxycycline.  The patient came by Dr. [name] office for evaluation and it was felt that he had due to his significant medical issues and infection in his left foot, needed admission to the hospital and medical care related to his diabetes and hypertension.  The patient was admitted to [facility] on August 10, 2011.  We were asked to do an official consultation on him.  The patient states that about 2-3 weeks ago, he hit his left foot and sustained minor trauma to his fourth toe area and over the past few weeks he noticed that the foot became more swollen.  Past few days, it started draining with a blackish discoloration of the fourth toe and a foul-smelling discharge was noted.  He does not have significant pain in his foot but does have some redness and swelling.  He denies headache, nausea, vomiting, diarrhea, or abdominal pain.  ALLERGIES:  The patient is allergic to PENICILLIN which he gets hives and swells past.  PAST  MEDICAL HISTORY:  Positive for hypertension and type 2 diabetes.  PAST SURGICAL HISTORY:  Positive for cholecystectomy and right leg grafting.  CURRENT HOME MEDICATIONS:  He is not taking any medication currently and he has not been taking his hypertensive medicines or diabetes medications for more than a year.  FAMILY HISTORY:  Positive for diabetes in his mother and father.  SOCIAL HISTORY:  He lives with his wife.  Denies drug abuse.  He had a history of smoking but quit in 1980s.  REVIEW OF SYSTEMS:  Negative except as noted in the HPI.  PHYSICAL EXAMINATION:  GENERAL:  This is a well-developed, well- nourished 60 year old male who is in no acute distress.  He is alert and oriented x3.  His speech is clear. VITAL SIGNS:  His temperature is 99.6, his pulse is 104, his respirations 20, blood pressure is 194/76, and pulse ox is 100% on room air. HEAD:  Normocephalic and atraumatic. EYES:  EOMs were intact. LUNGS:  Clear to auscultation in bilateral lung fields. HEART:  Regular rate and rhythm without murmurs, rubs, or gallops. ABDOMEN:  Soft and nontender. EXTREMITIES:  His lower extremities  show no calf swelling or tenderness. His left forefoot has some redness and swelling and his left fourth toe has some blackish discoloration.  It is tender and has obvious draining purulence.  DIAGNOSES: 1. Osteomyelitis, left fourth toe with left foot infection and     cellulitis. 2. Uncontrolled diabetes. 3. Uncontrolled hypertension.  PLAN:  Medical care per [facility].  Empirically go ahead and start him on IV vancomycin, Cipro, and Flagyl.  An MRI has been ordered of the left foot to evaluate him for osteomyelitis and a foot abscess. He most likely will need at least fourth toe amputation, may be more depending upon results of his MRI.  We will follow him carefully during this hospitalization and we appreciate the opportunity to assist in his care.  We also appreciate  the [facility] medical care.     Gary Fleet, P.A.   ______________________________ [name], M.D.    JB/MEDQ  D:  08/10/2011  T:  08/11/2011  Job:  [id number]  cc:   Azalia Bilis, M.D. Robert A. Alyson Ingles, M.D.  Electronically Signed by Gary Fleet P.A. on 08/11/2011 05:00:06 PM Electronically Signed by Dorna Leitz M.D. on 09/02/2011 08:27:47 AM

## 2011-09-02 NOTE — Op Note (Signed)
NAME:  [name], [name] NO.:  [id number]  MEDICAL RECORD NO.:  [id number]  LOCATION:  [id number]                         FACILITY:  [place]  PHYSICIAN:  [name], M.D.   DATE OF BIRTH:  1951-11-12  DATE OF PROCEDURE:  08/13/2011 DATE OF DISCHARGE:                              OPERATIVE REPORT   PREOPERATIVE DIAGNOSES: 1. Osteomyelitis of the fourth toe. 2. Osteomyelitis of the metatarsal head of fourth.  POSTOPERATIVE DIAGNOSES: 1. Osteomyelitis of the fourth toe. 2. Osteomyelitis of the metatarsal head of fourth.  PROCEDURE: 1. Amputation of the metatarsophalangeal joint, fourth toe, left foot. 2. Excision of metatarsal head, fourth toe, left foot.  SURGEON:  [name], MD  ASSISTANT: 1. Gary Fleet, PA 2. Nicholes Calamity, PA student.  ANESTHESIA:  MAC with ankle block.  BRIEF HISTORY:  [name] is a 60 year old male who has a long history of having had diabetes and high blood pressure.  He just stopped taking medicine because he just could not get a hold of it and began having blood sugars out of control, blood pressure is out of control, eventually he was diagnosed with a fourth metatarsal osteomyelitis and infection.  He was admitted to the Hospitalist Service and put on IV antibiotics.  This helped with the cellulitis over his foot, but he had continued persisted infection in the toe and so he was taken to the operating room for amputation and other as needed.  PROCEDURE IN DETAIL:  The patient was taken to the operating room, and after adequate anesthesia was obtained with general anesthetic, the patient was placed supine on the operating table.  The left foot was then prepped and draped in usual sterile fashion.  After the leg was elevated for 3 minutes, blood pressure tourniquet was inflated to 300 mmHg.  Following this, a curved incision was made what we appeared to be the distal extent of the decent skin over the phalanx and then we  kind of tried to go as far as we could on decent skin in the web space.  Once we did this, we dissected back, removed the toe, and sent that to Pathology for culture.  We then took a rongeur out and rongeured out the metatarsal head back to the clear area of normal-appearing bone.  Once that was done, we pulled the flexor and extensor tendons down and cut those and at this point, irrigated the wound thoroughly, let the tourniquet down, all bleeders controlled with electrocautery.  We then closed the wound with interrupted nylon suture.  We did have a little bit of difficulty getting the wound closed on that most medial side of the toe just because there was not enough skin from what was decent skin left in that area.  Once we got it closed, sterile compressive dressing was applied, and the patient was taken to recovery room and was noted to be in satisfactory condition.  Estimated blood loss for this procedure was less 25 mL.     [name], M.D.     Corliss Skains  D:  08/13/2011  T:  08/13/2011  Job:  [id number]  Electronically Signed by Dorna Leitz M.D. on 09/02/2011  08:27:49 AM

## 2011-09-19 NOTE — Consult Note (Signed)
[name], [name] NO.:  [id number]  MEDICAL RECORD NO.:  [id number]  LOCATION:  5035                         FACILITY:  [place]  PHYSICIAN:  [name], MD  DATE OF BIRTH:  1951-05-03  DATE OF CONSULTATION: DATE OF DISCHARGE:                                CONSULTATION   REQUESTING PHYSICIAN:  [name], MD.  REASON FOR INFECTIOUS DISEASE CONSULTATION:  The patient with necrotic gangrenous fourth toe with osteomyelitis.  DISCUSSION:  For details, please see the handwritten note in the paper chart as written by my visiting physician assistant  .  I have examined the electronic and paper medical record including history of present illness, medical history, past surgical history, family history, allergies, medications, 12-point review of systems.  I have examined the patient and I agree with the assessment and plan as noted in his note with the following addendum:  Briefly, this is a 60 year old African American male with history of poorly controlled diabetes and hypertension secondary to his having no insurance, and who sustained injury to his left foot, which he did not fully appreciate probably due to his diabetic neuropathy.  In any case, this progressed and he developed an area of necrosis, which is worsened now and is now foul- smelling.  He also did develop increased edema and swelling, erythema around the foot and go extending into the leg.  He was evaluated by Dr. [name] and admitted to the hospitalist service where he was placed on empiric antibiotics for coverage of his necrotic toe.  He had a MRI performed, which showed soft-tissue emphysema in the fourth toe with edema enhancement of the fourth metatarsal head adjacent to the proximal phalangeal base, suspicious for osteomyelitis.  He has amputation planned for later in the week.  Currently, he is having his blood pressure optimized and he has also been given as mentioned  antibiotics. His labs were also pertinent for high sed rate above 100.  We are asked to assist in management and workup of this patient for osteomyelitis. Of note, the patient also has developed olecranon bursitis in the right side.  This site is not warm, erythematous, or tender, but it is a bit intriguing that this developed in the recent last two weeks, which is around the same time period that he developed the severe infection of his fourth toe.  IMPRESSION AND RECOMMENDATIONS:  This is a 60 year old gentleman with diabetes, hypertension, neuropathy, who has developed a necrotic fourth toe, also with olecranon bursitis. 1. Necrotic toe infection:  Certainly under ideal circumstances, we     would like to have had an amputation of the toe with the toe being     sent for culture, bone culture specifically, so we could see what     pathogens were responsible.  He likely has a polymicrobial     infection, and certainly I would want him to be covering for the     usual suspects including methicillin-resistant Staphylococcus     aureus, methicillin-sensitive Staphylococcus aureus streptococcal     species, as well as anaerobes, and certainly as well as Pseudomonas     given his diabetes and given the presence  of an open gangrenous     wound.  I agree with the Flagyl and ciprofloxacin and these can be     changed to oral antibiotics as they are very highly bioavailable.     I clearly agree to continue the IV vancomycin.  I will place the     patient in contact precautions given the concern for methicillin-     resistant Staphylococcus aureus .  We will follow up his course and     see what happens with his amputation.  I would send the toe bone     for culture still to see if it might give [place] form of therapy,     although it may not yield any organisms on the subsequent culture.     I would treat this patient for at least a month with antibiotics.     The ciprofloxacin and Flagyl are very  bioavailable and certainly     can be taken in an oral form.  If we go with an all oral regimen,     we would need to change the vancomycin to something such as Bactrim     or doxycycline.  Alternatively, we could continue with IV therapy     for the vancomycin, but this would require charity care and     certainly if we could go down that route. The patient currently,     however, voices preference for oral therapy. 2. Olecranon bursitis.  Certainly, this site is not erythematous or     tender.  It does bother me that it occurred in the same timeframe     that the infection emerged.  Certainly, I would worry about     undiagnosed bacteremia and the patient did not have blood cultures     on admission.  For now, I will continue to observe and then watch     this site.  Certainly, if it  becomes more painful or red, I would     have an aspirate performed for cell count and differential most     importantly, as well as culture, although the culture unlikely     yield any organisms on antibiotics he currently is on. 3. Screening.  I would screen him for HIV.  Thank you for this infectious disease consultation.     [name], MD     CV/MEDQ  D:  08/11/2011  T:  08/12/2011  Job:  [id number]  cc:   [name], MD [name], M.D.  Electronically Signed by Rhina Brackett DAM MD on 09/19/2011 09:54:37 PM

## 2011-09-23 LAB — BASIC METABOLIC PANEL
BUN: 17
Calcium: 9.1
Creatinine, Ser: 1.3
GFR calc non Af Amer: 57 — ABNORMAL LOW
Glucose, Bld: 172 — ABNORMAL HIGH
Potassium: 4

## 2011-09-23 LAB — HEMOGLOBIN AND HEMATOCRIT, BLOOD: Hemoglobin: 12 — ABNORMAL LOW

## 2012-01-28 ENCOUNTER — Emergency Department (HOSPITAL_COMMUNITY): Payer: Self-pay

## 2012-01-28 ENCOUNTER — Other Ambulatory Visit: Payer: Self-pay

## 2012-01-28 ENCOUNTER — Encounter (HOSPITAL_COMMUNITY)
Admission: EM | Disposition: A | Discharge status: Home Health | Payer: Self-pay | Source: Home / Self Care | Attending: Internal Medicine

## 2012-01-28 ENCOUNTER — Inpatient Hospital Stay (HOSPITAL_COMMUNITY)
Admission: EM | Admit: 2012-01-28 | Discharge: 2012-02-04 | DRG: 580 | Disposition: A | Discharge status: Home Health | Payer: Self-pay | Attending: Internal Medicine | Admitting: Internal Medicine

## 2012-01-28 ENCOUNTER — Encounter (HOSPITAL_COMMUNITY): Payer: Self-pay | Admitting: Anesthesiology

## 2012-01-28 ENCOUNTER — Emergency Department (HOSPITAL_COMMUNITY): Payer: Self-pay | Admitting: Anesthesiology

## 2012-01-28 ENCOUNTER — Encounter (HOSPITAL_COMMUNITY): Payer: Self-pay | Admitting: Adult Health

## 2012-01-28 DIAGNOSIS — N289 Disorder of kidney and ureter, unspecified: Secondary | ICD-10-CM

## 2012-01-28 DIAGNOSIS — S98139A Complete traumatic amputation of one unspecified lesser toe, initial encounter: Secondary | ICD-10-CM

## 2012-01-28 DIAGNOSIS — E119 Type 2 diabetes mellitus without complications: Secondary | ICD-10-CM | POA: Diagnosis present

## 2012-01-28 DIAGNOSIS — L02519 Cutaneous abscess of unspecified hand: Principal | ICD-10-CM

## 2012-01-28 DIAGNOSIS — J329 Chronic sinusitis, unspecified: Secondary | ICD-10-CM | POA: Diagnosis present

## 2012-01-28 DIAGNOSIS — Z87891 Personal history of nicotine dependence: Secondary | ICD-10-CM

## 2012-01-28 DIAGNOSIS — J320 Chronic maxillary sinusitis: Secondary | ICD-10-CM | POA: Diagnosis present

## 2012-01-28 DIAGNOSIS — I10 Essential (primary) hypertension: Secondary | ICD-10-CM | POA: Diagnosis present

## 2012-01-28 DIAGNOSIS — Z794 Long term (current) use of insulin: Secondary | ICD-10-CM

## 2012-01-28 DIAGNOSIS — D649 Anemia, unspecified: Secondary | ICD-10-CM | POA: Diagnosis present

## 2012-01-28 DIAGNOSIS — D72829 Elevated white blood cell count, unspecified: Secondary | ICD-10-CM | POA: Diagnosis present

## 2012-01-28 DIAGNOSIS — M009 Pyogenic arthritis, unspecified: Secondary | ICD-10-CM | POA: Diagnosis present

## 2012-01-28 DIAGNOSIS — J322 Chronic ethmoidal sinusitis: Secondary | ICD-10-CM | POA: Diagnosis present

## 2012-01-28 DIAGNOSIS — N179 Acute kidney failure, unspecified: Secondary | ICD-10-CM | POA: Diagnosis present

## 2012-01-28 DIAGNOSIS — L03119 Cellulitis of unspecified part of limb: Secondary | ICD-10-CM

## 2012-01-28 DIAGNOSIS — L03019 Cellulitis of unspecified finger: Secondary | ICD-10-CM | POA: Diagnosis present

## 2012-01-28 DIAGNOSIS — Z7982 Long term (current) use of aspirin: Secondary | ICD-10-CM

## 2012-01-28 DIAGNOSIS — Z880 Allergy status to penicillin: Secondary | ICD-10-CM

## 2012-01-28 DIAGNOSIS — M6580 Other synovitis and tenosynovitis, unspecified site: Secondary | ICD-10-CM | POA: Diagnosis present

## 2012-01-28 DIAGNOSIS — E871 Hypo-osmolality and hyponatremia: Secondary | ICD-10-CM | POA: Diagnosis present

## 2012-01-28 HISTORY — DX: Osteomyelitis, unspecified: M86.9

## 2012-01-28 HISTORY — DX: Essential (primary) hypertension: I10

## 2012-01-28 HISTORY — PX: I&D EXTREMITY: SHX5045

## 2012-01-28 LAB — COMPREHENSIVE METABOLIC PANEL
ALT: 66 U/L — ABNORMAL HIGH (ref 0–53)
AST: 73 U/L — ABNORMAL HIGH (ref 0–37)
CO2: 21 mEq/L (ref 19–32)
Calcium: 8.5 mg/dL (ref 8.4–10.5)
Chloride: 96 mEq/L (ref 96–112)
Creatinine, Ser: 2.04 mg/dL — ABNORMAL HIGH (ref 0.50–1.35)
GFR calc Af Amer: 39 mL/min — ABNORMAL LOW (ref >90)
GFR calc non Af Amer: 34 mL/min — ABNORMAL LOW (ref >90)
Glucose, Bld: 158 mg/dL — ABNORMAL HIGH (ref 70–99)
Sodium: 129 mEq/L — ABNORMAL LOW (ref 135–145)
Total Bilirubin: 0.3 mg/dL (ref 0.3–1.2)

## 2012-01-28 LAB — GLUCOSE, CAPILLARY
Glucose-Capillary: 183 mg/dL — ABNORMAL HIGH (ref 70–99)
Glucose-Capillary: 190 mg/dL — ABNORMAL HIGH (ref 70–99)

## 2012-01-28 LAB — BASIC METABOLIC PANEL
BUN: 29 mg/dL — ABNORMAL HIGH (ref 6–23)
CO2: 25 mEq/L (ref 19–32)
Calcium: 9.2 mg/dL (ref 8.4–10.5)
Chloride: 92 mEq/L — ABNORMAL LOW (ref 96–112)
Creatinine, Ser: 2.03 mg/dL — ABNORMAL HIGH (ref 0.50–1.35)
GFR calc Af Amer: 39 mL/min — ABNORMAL LOW (ref >90)
GFR calc non Af Amer: 34 mL/min — ABNORMAL LOW (ref >90)
Glucose, Bld: 177 mg/dL — ABNORMAL HIGH (ref 70–99)
Potassium: 3.5 mEq/L (ref 3.5–5.1)
Sodium: 128 mEq/L — ABNORMAL LOW (ref 135–145)

## 2012-01-28 LAB — CBC
HCT: 31.8 % — ABNORMAL LOW (ref 39.0–52.0)
HCT: 27.8 % — ABNORMAL LOW (ref 39.0–52.0)
Hemoglobin: 10.7 g/dL — ABNORMAL LOW (ref 13.0–17.0)
Hemoglobin: 9.2 g/dL — ABNORMAL LOW (ref 13.0–17.0)
MCH: 27.6 pg (ref 26.0–34.0)
MCHC: 33.6 g/dL (ref 30.0–36.0)
MCHC: 33.1 g/dL (ref 30.0–36.0)
MCV: 82.2 fL (ref 78.0–100.0)
MCV: 83.2 fL (ref 78.0–100.0)
Platelets: 238 10*3/uL (ref 150–400)
RBC: 3.87 MIL/uL — ABNORMAL LOW (ref 4.22–5.81)
RDW: 14.5 % (ref 11.5–15.5)
RDW: 14.8 % (ref 11.5–15.5)
WBC: 17.4 10*3/uL — ABNORMAL HIGH (ref 4.0–10.5)

## 2012-01-28 SURGERY — IRRIGATION AND DEBRIDEMENT EXTREMITY
Anesthesia: General | Site: Hand | Laterality: Left | Wound class: Dirty or Infected

## 2012-01-28 MED ORDER — MIDAZOLAM HCL 5 MG/5ML IJ SOLN
INTRAMUSCULAR | Status: DC | PRN
  Administered 2012-01-28: 1 mg via INTRAVENOUS

## 2012-01-28 MED ORDER — CLINDAMYCIN PHOSPHATE 600 MG/50ML IV SOLN
INTRAVENOUS | Status: DC | PRN
  Administered 2012-01-28: 600 mg via INTRAVENOUS

## 2012-01-28 MED ORDER — FENTANYL CITRATE 0.05 MG/ML IJ SOLN: 25.0000 ug | INTRAMUSCULAR | Status: DC | PRN

## 2012-01-28 MED ORDER — OXYCODONE HCL 5 MG PO TABS
5.0000 mg | ORAL_TABLET | ORAL | Status: DC | PRN
  Administered 2012-02-03 – 2012-02-04 (×4): 10 mg via ORAL
  Filled 2012-01-28 (×4): qty 2

## 2012-01-28 MED ORDER — SODIUM CHLORIDE 0.9 % IR SOLN
Status: DC | PRN
  Administered 2012-01-28: 20:00:00

## 2012-01-28 MED ORDER — INSULIN ASPART 100 UNIT/ML ~~LOC~~ SOLN
0.0000 [IU] | SUBCUTANEOUS | Status: DC
  Administered 2012-01-28 – 2012-01-29 (×4): 3 [IU] via SUBCUTANEOUS
  Administered 2012-01-29: 8 [IU] via SUBCUTANEOUS
  Administered 2012-01-29: 5 [IU] via SUBCUTANEOUS
  Administered 2012-01-30 – 2012-01-31 (×9): 3 [IU] via SUBCUTANEOUS
  Administered 2012-01-31: 5 [IU] via SUBCUTANEOUS
  Administered 2012-01-31: 2 [IU] via SUBCUTANEOUS
  Administered 2012-02-01: 3 [IU] via SUBCUTANEOUS
  Administered 2012-02-01 – 2012-02-02 (×2): 2 [IU] via SUBCUTANEOUS
  Administered 2012-02-02 (×2): 5 [IU] via SUBCUTANEOUS
  Filled 2012-01-28: qty 3
  Filled 2012-01-28: qty 1

## 2012-01-28 MED ORDER — MORPHINE SULFATE 2 MG/ML IJ SOLN
1.0000 mg | INTRAMUSCULAR | Status: DC | PRN
  Administered 2012-01-29 – 2012-01-30 (×2): 1 mg via INTRAVENOUS
  Filled 2012-01-28 (×2): qty 1

## 2012-01-28 MED ORDER — HYDROMORPHONE HCL PF 1 MG/ML IJ SOLN
1.0000 mg | Freq: Once | INTRAMUSCULAR | Status: AC
  Administered 2012-01-28: 1 mg via INTRAVENOUS
  Filled 2012-01-28: qty 1

## 2012-01-28 MED ORDER — DOCUSATE SODIUM 100 MG PO CAPS
100.0000 mg | ORAL_CAPSULE | Freq: Two times a day (BID) | ORAL | Status: DC
  Administered 2012-01-29 – 2012-02-04 (×10): 100 mg via ORAL
  Filled 2012-01-28 (×15): qty 1

## 2012-01-28 MED ORDER — GENTAMICIN SULFATE 40 MG/ML IJ SOLN
120.0000 mg | Freq: Once | INTRAVENOUS | Status: AC
  Administered 2012-01-28: 120 mg via INTRAVENOUS
  Filled 2012-01-28: qty 3

## 2012-01-28 MED ORDER — PROMETHAZINE HCL 25 MG/ML IJ SOLN: 12.5000 mg | Freq: Four times a day (QID) | INTRAMUSCULAR | Status: DC | PRN

## 2012-01-28 MED ORDER — PROPOFOL 10 MG/ML IV EMUL
INTRAVENOUS | Status: DC | PRN
  Administered 2012-01-28: 200 mg via INTRAVENOUS

## 2012-01-28 MED ORDER — LIDOCAINE HCL (CARDIAC) 20 MG/ML IV SOLN
INTRAVENOUS | Status: DC | PRN
  Administered 2012-01-28: 100 mg via INTRAVENOUS

## 2012-01-28 MED ORDER — EPHEDRINE SULFATE 50 MG/ML IJ SOLN
INTRAMUSCULAR | Status: DC | PRN
  Administered 2012-01-28 (×2): 5 mg via INTRAVENOUS

## 2012-01-28 MED ORDER — ONDANSETRON HCL 4 MG PO TABS: 4.0000 mg | ORAL_TABLET | Freq: Four times a day (QID) | ORAL | Status: DC | PRN

## 2012-01-28 MED ORDER — LEVOFLOXACIN IN D5W 500 MG/100ML IV SOLN
500.0000 mg | INTRAVENOUS | Status: DC
  Filled 2012-01-28: qty 100

## 2012-01-28 MED ORDER — VITAMIN C 500 MG PO TABS
1000.0000 mg | ORAL_TABLET | Freq: Every day | ORAL | Status: DC
  Administered 2012-01-29 – 2012-02-04 (×6): 1000 mg via ORAL
  Filled 2012-01-28 (×8): qty 2

## 2012-01-28 MED ORDER — METHOCARBAMOL 100 MG/ML IJ SOLN
500.0000 mg | Freq: Four times a day (QID) | INTRAVENOUS | Status: DC | PRN
  Administered 2012-02-01: 500 mg via INTRAVENOUS
  Filled 2012-01-28 (×2): qty 5

## 2012-01-28 MED ORDER — METHOCARBAMOL 500 MG PO TABS
500.0000 mg | ORAL_TABLET | Freq: Four times a day (QID) | ORAL | Status: DC | PRN
  Administered 2012-02-03: 500 mg via ORAL
  Filled 2012-01-28 (×2): qty 1

## 2012-01-28 MED ORDER — SODIUM CHLORIDE 0.9 % IV BOLUS (SEPSIS)
1000.0000 mL | Freq: Once | INTRAVENOUS | Status: AC
  Administered 2012-01-28: 1000 mL via INTRAVENOUS

## 2012-01-28 MED ORDER — ONDANSETRON HCL 4 MG/2ML IJ SOLN
4.0000 mg | Freq: Once | INTRAMUSCULAR | Status: AC
  Administered 2012-01-28: 4 mg via INTRAVENOUS
  Filled 2012-01-28: qty 2

## 2012-01-28 MED ORDER — CLINDAMYCIN PHOSPHATE 900 MG/50ML IV SOLN
900.0000 mg | Freq: Once | INTRAVENOUS | Status: AC
  Administered 2012-01-28: 900 mg via INTRAVENOUS
  Filled 2012-01-28: qty 50

## 2012-01-28 MED ORDER — LACTATED RINGERS IV SOLN: INTRAVENOUS | Status: DC

## 2012-01-28 MED ORDER — PROMETHAZINE HCL 25 MG/ML IJ SOLN: 6.2500 mg | INTRAMUSCULAR | Status: DC | PRN

## 2012-01-28 MED ORDER — FENTANYL CITRATE 0.05 MG/ML IJ SOLN
INTRAMUSCULAR | Status: DC | PRN
  Administered 2012-01-28: 25 ug via INTRAVENOUS
  Administered 2012-01-28: 50 ug via INTRAVENOUS

## 2012-01-28 MED ORDER — LACTATED RINGERS IV SOLN
INTRAVENOUS | Status: DC
  Administered 2012-01-28: 23:00:00 via INTRAVENOUS

## 2012-01-28 MED ORDER — HYDROMORPHONE HCL PF 1 MG/ML IJ SOLN
INTRAMUSCULAR | Status: AC
  Administered 2012-01-28: 1 mg
  Filled 2012-01-28: qty 1

## 2012-01-28 MED ORDER — ALPRAZOLAM 0.5 MG PO TABS: 0.5000 mg | ORAL_TABLET | Freq: Four times a day (QID) | ORAL | Status: DC | PRN

## 2012-01-28 MED ORDER — BUPIVACAINE HCL (PF) 0.25 % IJ SOLN
INTRAMUSCULAR | Status: AC
  Filled 2012-01-28: qty 30

## 2012-01-28 MED ORDER — ONDANSETRON HCL 4 MG/2ML IJ SOLN: 4.0000 mg | Freq: Four times a day (QID) | INTRAMUSCULAR | Status: DC | PRN

## 2012-01-28 MED ORDER — LACTATED RINGERS IV SOLN
INTRAVENOUS | Status: DC | PRN
  Administered 2012-01-28: 18:00:00 via INTRAVENOUS

## 2012-01-28 MED ORDER — CLINDAMYCIN PHOSPHATE 600 MG/50ML IV SOLN
600.0000 mg | Freq: Three times a day (TID) | INTRAVENOUS | Status: DC
  Filled 2012-01-28 (×3): qty 50

## 2012-01-28 MED ORDER — CLINDAMYCIN PHOSPHATE 600 MG/50ML IV SOLN
INTRAVENOUS | Status: AC
  Filled 2012-01-28: qty 50

## 2012-01-28 MED ORDER — PROMETHAZINE HCL 25 MG RE SUPP: 12.5000 mg | Freq: Four times a day (QID) | RECTAL | Status: DC | PRN

## 2012-01-28 MED ORDER — FAMOTIDINE 20 MG PO TABS
20.0000 mg | ORAL_TABLET | Freq: Two times a day (BID) | ORAL | Status: DC | PRN
  Filled 2012-01-28: qty 1

## 2012-01-28 MED ORDER — DIAZEPAM 5 MG/ML IJ SOLN
5.0000 mg | Freq: Once | INTRAMUSCULAR | Status: AC
  Administered 2012-01-28: 5 mg via INTRAVENOUS
  Filled 2012-01-28: qty 2

## 2012-01-28 SURGICAL SUPPLY — 36 items
BAG ZIPLOCK 12X15 (MISCELLANEOUS) ×2 IMPLANT
BANDAGE ELASTIC 4 VELCRO ST LF (GAUZE/BANDAGES/DRESSINGS) ×2 IMPLANT
BANDAGE GAUZE ELAST BULKY 4 IN (GAUZE/BANDAGES/DRESSINGS) ×2 IMPLANT
BLADE SURG SZ10 CARB STEEL (BLADE) ×2 IMPLANT
CLOTH BEACON ORANGE TIMEOUT ST (SAFETY) ×2 IMPLANT
CUFF TOURN SGL QUICK 18 (TOURNIQUET CUFF) ×2 IMPLANT
DRAIN PENROSE 18X1/2 LTX STRL (DRAIN) ×2 IMPLANT
DRAPE SURG 17X11 SM STRL (DRAPES) ×2 IMPLANT
DRESSING XEROFORM 5X9 (GAUZE/BANDAGES/DRESSINGS) ×2 IMPLANT
DRSG ADAPTIC 3X8 NADH LF (GAUZE/BANDAGES/DRESSINGS) ×2 IMPLANT
DRSG PAD ABDOMINAL 8X10 ST (GAUZE/BANDAGES/DRESSINGS) ×2 IMPLANT
ELECT REM PT RETURN 9FT ADLT (ELECTROSURGICAL) ×2
ELECTRODE REM PT RTRN 9FT ADLT (ELECTROSURGICAL) ×1 IMPLANT
GAUZE KERLIX 2  STERILE LF (GAUZE/BANDAGES/DRESSINGS) ×2 IMPLANT
GAUZE XEROFORM 1X8 LF (GAUZE/BANDAGES/DRESSINGS) ×2 IMPLANT
GLOVE BIO SURGEON STRL SZ8 (GLOVE) IMPLANT
GOWN STRL REIN XL XLG (GOWN DISPOSABLE) ×2 IMPLANT
KIT BASIN OR (CUSTOM PROCEDURE TRAY) ×2 IMPLANT
MANIFOLD NEPTUNE II (INSTRUMENTS) ×2 IMPLANT
PACK LOWER EXTREMITY WL (CUSTOM PROCEDURE TRAY) ×2 IMPLANT
PAD CAST 4YDX4 CTTN HI CHSV (CAST SUPPLIES) ×1 IMPLANT
PADDING CAST COTTON 4X4 STRL (CAST SUPPLIES) ×1
PADDING WEBRIL 4 STERILE (GAUZE/BANDAGES/DRESSINGS) ×2 IMPLANT
POSITIONER SURGICAL ARM (MISCELLANEOUS) ×2 IMPLANT
SOL PREP POV-IOD 16OZ 10% (MISCELLANEOUS) ×2 IMPLANT
SOL PREP PROV IODINE SCRUB 4OZ (MISCELLANEOUS) ×2 IMPLANT
SPLINT PLASTER X FASTSET 4X15 (CAST SUPPLIES) ×2 IMPLANT
SPONGE GAUZE 4X4 12PLY (GAUZE/BANDAGES/DRESSINGS) ×2 IMPLANT
SUT PROLENE 3 0 PS 2 (SUTURE) ×2 IMPLANT
SUT VIC AB 1 CT1 27 (SUTURE) ×1
SUT VIC AB 1 CT1 27XBRD ANTBC (SUTURE) ×1 IMPLANT
SUT VIC AB 2-0 CT1 27 (SUTURE) ×1
SUT VIC AB 2-0 CT1 27XBRD (SUTURE) ×1 IMPLANT
SYR 20CC LL (SYRINGE) ×2 IMPLANT
SYR CONTROL 10ML LL (SYRINGE) ×2 IMPLANT
TOWEL OR 17X26 10 PK STRL BLUE (TOWEL DISPOSABLE) ×2 IMPLANT

## 2012-01-28 NOTE — ED Notes (Signed)
Pt. For surgery with Dr. Amedeo Plenty after obtaining EKG and chest X-RAY

## 2012-01-28 NOTE — ED Provider Notes (Signed)
History    61 year old male with left arm pain. Atraumatic. Gradual onset about a week ago. Began in his hand and has since progressed to include his forearm and elbow. Erythema and swelling. Was seen as an outpatient and started on doxycycline a few days ago. Has been taking but symptoms have progressed. Subjective fever. Patient is diabetic. Denies significant pain anywhere else. No vomiting.  CSN: [id number]  Arrival date & time 01/28/12  1026   First MD Initiated Contact with Patient 01/28/12 1036      Chief Complaint  Patient presents with  . Blood Sugar Problem  . Arm Swelling    (Consider location/radiation/quality/duration/timing/severity/associated sxs/prior treatment) HPI  Past Medical History  Diagnosis Date  . Diabetes mellitus     History reviewed. No pertinent past surgical history.  History reviewed. No pertinent family history.  History  Substance Use Topics  . Smoking status: Never Smoker   . Smokeless tobacco: Not on file  . Alcohol Use: No      Review of Systems   Review of symptoms negative unless otherwise noted in HPI.   Allergies  Penicillins  Home Medications   Current Outpatient Rx  Name Route Sig Dispense Refill  . DOXYCYCLINE HYCLATE 100 MG PO TABS Oral Take 100 mg by mouth 2 (two) times daily. Started 01/25/12; 10 day course of therapy not yet completed.    Marland Kitchen HYDROCODONE-ACETAMINOPHEN 5-500 MG PO TABS Oral Take 1 tablet by mouth every 6 (six) hours as needed. For pain.    . INSULIN ISOPHANE & REGULAR (70-30) 100 UNIT/ML [place] SUSP Subcutaneous Inject 15 Units into the skin 2 (two) times daily with a meal.    . LISINOPRIL 20 MG PO TABS Oral Take 20 mg by mouth daily.    Marland Kitchen VITAMIN C 500 MG PO TABS Oral Take 1,000 mg by mouth daily.      BP 154/74  Pulse 88  Temp(Src) 100.2 F (37.9 C) (Oral)  Resp 16  SpO2 100%  Physical Exam  Nursing note and vitals reviewed. Constitutional: He appears well-developed and well-nourished. No  distress.  HENT:  Head: Normocephalic and atraumatic.  Eyes: Conjunctivae are normal. Right eye exhibits no discharge. Left eye exhibits no discharge.  Neck: Neck supple.  Cardiovascular: Normal rate, regular rhythm and normal heart sounds.  Exam reveals no gallop and no friction rub.   No murmur heard. Pulmonary/Chest: Effort normal and breath sounds normal. No respiratory distress.  Abdominal: Soft. He exhibits no distension. There is no tenderness.  Musculoskeletal: He exhibits no edema and no tenderness.       Left hand and forearm markedly swollen. Very tender to palpation. Pain with range of motion of the hand and wrist. There is no crepitus. No bullae. Increased warmth mild erythema. Patient still has a ring on his ring finger. No evidence of neurovascular injury distal to this.   Neurological: He is alert.  Skin: Skin is warm and dry.  Psychiatric: He has a normal mood and affect. His behavior is normal. Thought content normal.    ED Course  Procedures (including critical care time)  Labs Reviewed  CBC - Abnormal; Notable for the following:    WBC 17.4 (*)    RBC 3.87 (*)    Hemoglobin 10.7 (*)    HCT 31.8 (*)    All other components within normal limits  BASIC METABOLIC PANEL - Abnormal; Notable for the following:    Sodium 128 (*)    Chloride 92 (*)  Glucose, Bld 177 (*)    BUN 29 (*)    Creatinine, Ser 2.03 (*)    GFR calc non Af Amer 34 (*)    GFR calc Af Amer 39 (*)    All other components within normal limits  GLUCOSE, CAPILLARY - Abnormal; Notable for the following:    Glucose-Capillary 183 (*)    All other components within normal limits  CULTURE, BLOOD (ROUTINE X 2)  CULTURE, BLOOD (ROUTINE X 2)   Dg Eye Foreign Body  01/28/2012  *RADIOLOGY REPORT*  Clinical Data: Metal working/exposure; clearance prior to MRI  ORBITS FOR FOREIGN BODY - 2 VIEW  Comparison:  None.  Findings:  There is no evidence of metallic foreign body within the orbits.  No significant  bone abnormality identified.  Opacification of the left maxillary sinus and bilateral ethmoid sinuses is seen, consistent with sinusitis.  IMPRESSION: 1.  No evidence of metallic foreign body within the orbits. 2.  Left maxillary and bilateral ethmoid sinus disease incidentally noted.  Original Report Authenticated By: Marlaine Hind, M.D.   [name]  01/28/2012  *RADIOLOGY REPORT*  Clinical Data: Diffuse hand pain, swelling and erythema.  Possible insect bite 5 days ago.  Question infection.  MRI OF THE LEFT HAND WITHOUT CONTRAST  Technique:  Multiplanar, multisequence MR imaging was performed. No intravenous contrast was administered.  Comparison: Radiographs 01/28/2012.  Findings: There is severe diffuse soft tissue swelling and edema throughout the hand.  Ill-defined fluid is present dorsally, measuring up to 1.8 cm in thickness.  This primarily lies superficial to the extensor tendons.  Proximal extent at the wrist is not imaged.  Distally, this extends into the proximal digits. Contrast was not administered, limiting assessment for focal fluid collections.  There is moderate fluid within the flexor and extensor tendon sheaths.  The wrist is incompletely imaged.  There is prominent fluid within the midcarpal compartment, deep to the extensor tendons. There is diffuse edema throughout the intrinsic hand muscles.  There is no evidence of bone destruction within the metacarpals, phalanges or distal carpal row.  All of the fingers are flexed, somewhat limiting distal phalangeal assessment.  IMPRESSION:  1.  Markedly abnormal study with extensive soft tissue edema and ill-defined fluid throughout the hand, especially dorsally. Findings are highly concerning for cellulitis, myofasciitis and associated tenosynovitis.  No discrete abscess is seen without contrast. 2.  The wrist is incompletely imaged.  Prominent fluid in the mid carpal compartment could reflect a reactive effusion although intra-  articular infection cannot be excluded. 3.  No evidence of osteomyelitis.  These results were called by telephone on 01/28/2012  at  1440 hours to  Dr. Wilson Singer, who verbally acknowledged these results.  Original Report Authenticated By: Vivia Ewing, M.D.   Dg Hand Complete Left  01/28/2012  *RADIOLOGY REPORT*  Clinical Data: Hand pain and swelling.  Insect bite/envenomation.  LEFT HAND - COMPLETE 3+ VIEW  Comparison: None.  Findings: Marked soft tissue swelling is present involving the entire hand.  No destructive osseous lesions are identified. Fingers are flexed.  No radiopaque foreign body is identified. Mild enthesopathy is present involving the dorsal ulnar aspect of the proximal phalanx of the small finger.  IMPRESSION: Marked soft tissue swelling of the hand without acute osseous abnormality.  Original Report Authenticated By: Dereck Ligas, M.D.     1. Abscess of hand   2. Diabetes mellitus   3. Renal insufficiency   4. Cellulitis and abscess of  hand   5. HTN (hypertension)   6. ARF (acute renal failure)   7. Hyponatremia   8. Normocytic anemia   9. Sinusitis       MDM  61 year old male with left hand and wrist pain. Imaging studies concerning for cellulitis, myofasciitis and associated tenosynovitis. Case was discussed with Dr. Phillip Heal in, hand surgery prior to MRI. Will see pt in ED.        Virgel Manifold, MD 02/09/12 [phone]

## 2012-01-28 NOTE — H&P (Signed)
Hospital Admission Note Date: 01/28/2012  Patient name: [name] Medical record number: [id number] Date of birth: 05/02/1951 Age: 61 y.o. Gender: male PCP: Hendricks Limes, MD, MD  Attending physician: Charlynne Cousins, MD Emergency Contact: Avrian Baliles (wife) [phone] Code Status:  Full  Chief Complaint: Left hand swelling and pain  History of Present Illness: [name] is an 61 y.o. male with a PMH of DM who presented to the hospital with a 4 day history of worsening right hand swelling.  The patient saw his PCP 3 days ago and was put on doxycycline and despite the antibiotics, the swelling has worsened and has become increasingly painful.  No obvious injury or bites, but felt a "sting" on the day he began to have symptoms.  He also has had fever and chills.  He has already been evaluated by Dr. Roseanne Kaufman who plans to take him to surgery for an I&D of left hand abscess/cellulitis.  We are asked to admit due to his underlying medical problems including DM, HTN and acute renal insufficiency.  Takes daily ASA.    Past Medical History Past Medical History  Diagnosis Date  . Diabetes mellitus   . HTN (hypertension)   . Osteomyelitis     Past Surgical History Past Surgical History  Procedure Date  . 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 Cholecystectomy   . Tonsillectomy     Meds: Prior to Admission medications   Medication Sig Start Date End Date Taking? Authorizing Provider  doxycycline (VIBRA-TABS) 100 MG tablet Take 100 mg by mouth 2 (two) times daily. Started 01/25/12; 10 day course of therapy not yet completed.   Yes Historical Provider, MD  HYDROcodone-acetaminophen (VICODIN) 5-500 MG per tablet Take 1 tablet by mouth every 6 (six) hours as needed. For pain.   Yes Historical Provider, MD  insulin NPH-insulin regular (NOVOLIN 70/30) (70-30) 100 UNIT/ML injection Inject 15 Units into the skin 2 (two) times daily with a meal.   Yes Historical Provider, MD    lisinopril (PRINIVIL,ZESTRIL) 20 MG tablet Take 20 mg by mouth daily.   Yes Historical Provider, MD  vitamin C (ASCORBIC ACID) 500 MG tablet Take 1,000 mg by mouth daily.   Yes Historical Provider, MD    Allergies: Penicillins  Social History: History   Social History  . Marital Status: Married    Spouse Name: [name]    Number of Children: 3  . Years of Education: 16   Occupational History  . Maintenance Technician    Social History Main Topics  . Smoking status: Former Smoker -- 0.5 packs/day for 4 years    Types: Cigarettes  . Smokeless tobacco: Never Used  . Alcohol Use: No  . Drug Use: No  . Sexually Active: Not on file   Other Topics Concern  . Not on file   Social History Narrative  . No narrative on file    Family History:  Family History  Problem Relation Age of Onset  . Heart disease Mother   . Diabetes Father   . Diabetes Brother   . Diabetes Sister   . Diabetes Brother   . Diabetes Brother     Review of Systems: Constitutional: + fever and chills;  Appetite diminished; No significant weight loss.  HEENT: No blurry vision or diplopia, no pharyngitis or dysphagia, + sinus congestion with recent URI virus CV: No chest pain or h/o arrhythmia, some subjective palpiations.  Resp: + mild SOB, + cough with white/tan sputum.  GI: No current N/V, no diarrhea, no melena or hematochezia.  GU: No dysuria or hematuria.  MSK: no myalgias/arthralgias.  Neuro:  + sinus headache, no focal neurological deficits.  Psych: No depression or anxiety.  Endo: No thyroid disease, +DM.  Skin: No rashes or lesions.  Heme: No anemia or blood dyscrasia   Physical Exam: Blood pressure 148/73, pulse 81, temperature 99 F (37.2 C), temperature source Oral, resp. rate 16, weight 102.059 kg (225 lb), SpO2 94.00%. BP 148/73  Pulse 81  Temp(Src) 99 F (37.2 C) (Oral)  Resp 16  Wt 102.059 kg (225 lb)  SpO2 94%  General Appearance:    Alert, cooperative, no distress, appears stated age   Head:    Normocephalic, without obvious abnormality, atraumatic  Eyes:    PERRL, conjunctiva/corneas clear, EOM's intact  Ears:    Normal TM's and external ear canals, both ears  Nose:   Nares normal, septum midline, mucosa normal, no drainage    or sinus tenderness  Throat:   Lips, mucosa, and tongue normal; teeth and gums normal  Neck:   Supple, symmetrical, trachea midline, no adenopathy;       thyroid:  No enlargement/tenderness/nodules; no carotid   bruit or JVD  Back:     Symmetric, no curvature, ROM normal, no CVA tenderness  Lungs:     Clear to auscultation bilaterally, respirations unlabored  Chest wall:    No tenderness or deformity  Heart:    Regular rate and rhythm, S1 and S2 normal, no murmur, rub   or gallop  Abdomen:     Soft, non-tender, bowel sounds active all four quadrants,    no masses, no organomegaly  Extremities:   Left hand markedly swollen and tender.  Other extremities WNL.  Pulses:   2+ and symmetric all extremities  Skin:   Skin color, texture, turgor normal, no rashes or lesions  Neurologic:   Non-focal   Lab results: Basic Metabolic Panel:  Lab [id number] 1110  NA 128*  K 3.5  CL 92*  CO2 25  GLUCOSE 177*  BUN 29*  CREATININE 2.03*  CALCIUM 9.2  MG --  PHOS --   GFR CrCl is unknown because there is no height on file for the current visit.   CBC:  Lab 01/28/12 1110  WBC 17.4*  NEUTROABS --  HGB 10.7*  HCT 31.8*  MCV 82.2  PLT 238   CBG:  Lab 01/28/12 1442 01/28/12 1051  GLUCAP 190* 183*    Imaging results:  Dg Eye Foreign Body  01/28/2012  *RADIOLOGY REPORT*  Clinical Data: Metal working/exposure; clearance prior to MRI  ORBITS FOR FOREIGN BODY - 2 VIEW  Comparison:  None.  Findings:  There is no evidence of metallic foreign body within the orbits.  No significant bone abnormality identified.  Opacification of the left maxillary sinus and bilateral ethmoid sinuses is seen, consistent with sinusitis.  IMPRESSION: 1.  No evidence of  metallic foreign body within the orbits. 2.  Left maxillary and bilateral ethmoid sinus disease incidentally noted.  Original Report Authenticated By: Marlaine Hind, M.D.   Dg Chest 2 View  01/28/2012  *RADIOLOGY REPORT*  Clinical Data: Preoperative exam for left arm surgery.  Diabetes and hypertension.  CHEST - 2 VIEW  Comparison: 08/12/2010  Findings: The heart size is stable and at the upper limits of normal.  Both lungs are clear.  No evidence of pleural effusion. No mass or lymphadenopathy identified.  Mild thoracic dextroscoliosis remains stable.  IMPRESSION: No active cardiopulmonary disease.  Original Report Authenticated By: Marlaine Hind, M.D.   [name]  01/28/2012  *RADIOLOGY REPORT*  Clinical Data: Diffuse hand pain, swelling and erythema.  Possible insect bite 5 days ago.  Question infection.  MRI OF THE LEFT HAND WITHOUT CONTRAST  Technique:  Multiplanar, multisequence MR imaging was performed. No intravenous contrast was administered.  Comparison: Radiographs 01/28/2012.  Findings: There is severe diffuse soft tissue swelling and edema throughout the hand.  Ill-defined fluid is present dorsally, measuring up to 1.8 cm in thickness.  This primarily lies superficial to the extensor tendons.  Proximal extent at the wrist is not imaged.  Distally, this extends into the proximal digits. Contrast was not administered, limiting assessment for focal fluid collections.  There is moderate fluid within the flexor and extensor tendon sheaths.  The wrist is incompletely imaged.  There is prominent fluid within the midcarpal compartment, deep to the extensor tendons. There is diffuse edema throughout the intrinsic hand muscles.  There is no evidence of bone destruction within the metacarpals, phalanges or distal carpal row.  All of the fingers are flexed, somewhat limiting distal phalangeal assessment.  IMPRESSION:  1.  Markedly abnormal study with extensive soft tissue edema and ill-defined  fluid throughout the hand, especially dorsally. Findings are highly concerning for cellulitis, myofasciitis and associated tenosynovitis.  No discrete abscess is seen without contrast. 2.  The wrist is incompletely imaged.  Prominent fluid in the mid carpal compartment could reflect a reactive effusion although intra- articular infection cannot be excluded. 3.  No evidence of osteomyelitis.  These results were called by telephone on 01/28/2012  at  1440 hours to  Dr. Wilson Singer, who verbally acknowledged these results.  Original Report Authenticated By: Vivia Ewing, M.D.   Dg Hand Complete Left  01/28/2012  *RADIOLOGY REPORT*  Clinical Data: Hand pain and swelling.  Insect bite/envenomation.  LEFT HAND - COMPLETE 3+ VIEW  Comparison: None.  Findings: Marked soft tissue swelling is present involving the entire hand.  No destructive osseous lesions are identified. Fingers are flexed.  No radiopaque foreign body is identified. Mild enthesopathy is present involving the dorsal ulnar aspect of the proximal phalanx of the small finger.  IMPRESSION: Marked soft tissue swelling of the hand without acute osseous abnormality.  Original Report Authenticated By: Dereck Ligas, M.D.    Assessment & Plan: Principal Problem:  *Cellulitis and abscess of hand The patient will be admitted and placed on broad-spectrum antibiotic therapy with vancomycin and Zosyn. The orthopedic surgeon has already evaluated the patient plans to take him for surgical debridement today. Hopefully, cultures will be sent. Antibiotics can be narrowed based on culture and sensitivity data. Active Problems:  Diabetes mellitus Unclear if the patient is well-controlled. We'll check a hemoglobin A1c and, while n.p.o., place him on sliding scale insulin, moderate scale.  HTN (hypertension) We will hold the patient's ACE inhibitor given his acute renal failure gently hydrate him. Monitor the patient's blood pressure closely and resume  antihypertensive therapy when indicated.  ARF (acute renal failure) The patient's baseline creatinine is 1.07-1.47, as measured on several occasions back in August. 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 We will hold his ACE inhibitor until his renal function is back to baseline.  Hyponatremia Likely related to dehydration. We will hydrate him with normal saline.  Normocytic anemia Likely a reflection of chronic kidney disease. Would not work this up further unless indicated.  Sinusitis  Patient has had a recent upper respiratory infection and incidental finding of sinusitis based on imaging done in the emergency department. He is being placed on antibiotics for his cellulitis which will cover any concurrent sinusitis.  Prophylaxis: We'll hold Lovenox since the patient is going for surgery and use PAS hoses for DVT prophylaxis. Lovenox can be initiated on 01/29/2012 If deemed appropriate  Time Spent On Admission: One hour  Aliyyah Riese 01/28/2012, 5:44 PM Pager [phone]

## 2012-01-28 NOTE — ED Notes (Signed)
Patient transported to MRI 

## 2012-01-28 NOTE — ED Notes (Signed)
MD at bedside. 

## 2012-01-28 NOTE — Anesthesia Preprocedure Evaluation (Addendum)
Anesthesia Evaluation  Patient identified by MRN, date of birth, ID band Patient awake    Reviewed: Allergy & Precautions, H&P , NPO status , Patient's Chart, lab work & pertinent test results  Airway Mallampati: II TM Distance: >3 FB Neck ROM: full    Dental  (+) Chipped, Poor Dentition and Missing Many missing teeth, mostly in back.  Most of front teeth chipped.:   Pulmonary neg pulmonary ROS,  clear to auscultation  Pulmonary exam normal       Cardiovascular Exercise Tolerance: Good neg cardio ROS regular Normal    Neuro/Psych Negative Neurological ROS  Negative Psych ROS   GI/Hepatic negative GI ROS, Neg liver ROS,   Endo/Other  Diabetes mellitus-, Well Controlled, Type 2, Insulin Dependent  Renal/GU negative Renal ROS  Genitourinary negative   Musculoskeletal   Abdominal   Peds  Hematology negative hematology ROS (+)   Anesthesia Other Findings   Reproductive/Obstetrics negative OB ROS                          Anesthesia Physical Anesthesia Plan  ASA: III  Anesthesia Plan: General   Post-op Pain Management:    Induction: Intravenous  Airway Management Planned: LMA  Additional Equipment:   Intra-op Plan:   Post-operative Plan:   Informed Consent: I have reviewed the patients History and Physical, chart, labs and discussed the procedure including the risks, benefits and alternatives for the proposed anesthesia with the patient or authorized representative who has indicated his/her understanding and acceptance.   Dental Advisory Given  Plan Discussed with: CRNA and Surgeon  Anesthesia Plan Comments:         Anesthesia Quick Evaluation

## 2012-01-28 NOTE — Brief Op Note (Signed)
01/28/2012  8:33 PM  PATIENT:  [name]  61 y.o. male  PRE-OPERATIVE DIAGNOSIS:  infected left hand  POST-OPERATIVE DIAGNOSIS:  infected left hand  PROCEDURE:  Procedure(s): #1 irrigation and debridement abscess left hand dorsal aspect #2 extensive extensor tendon tenosynovectomy/radical in nature including the second through fifth dorsal compartments #3 arthrotomy/ synovectomy radiocarpal and midcarpal joints left wrist noted abscess #4 posterior interosseous nerve neurectomy #5 open carpal tunnel release #6 radical flexor tenosynovectomy left wrist #7 irrigation and debridement deep abscess mid palm space #8 irrigation and debridement flexor digitorum profundus and superficialis to the small finger #9 irrigation and debridement flexor digitorum profundus and superficialis to the ring finger  SURGEON:  Surgeon(s): Paulene Floor, MD  PHYSICIAN ASSISTANT:   ASSISTANTS: none   ANESTHESIA:   general  EBL:     BLOOD ADMINISTERED:none  DRAINS: Penrose drain in the Dorsal and a volar wrist and hand   LOCAL MEDICATIONS USED:  NONE  SPECIMEN:  Excision  DISPOSITION OF SPECIMEN:  N/A  COUNTS:  YES  TOURNIQUET:   Total Tourniquet Time Documented: Upper Arm (Left) - 56 minutes  DICTATION: .Other Dictation: Dictation Number [id number]  PLAN OF CARE: Admit to inpatient   PATIENT DISPOSITION:  PACU - hemodynamically stable.   Delay start of Pharmacological VTE agent (>24hrs) due to surgical blood loss or risk of bleeding:  {YES/NO/NOT APPLICABLE:20182

## 2012-01-28 NOTE — Addendum Note (Signed)
Addendum  created 01/28/12 2206 by Peyton Najjar, MD   Modules edited:Notes Section

## 2012-01-28 NOTE — Preoperative (Signed)
Beta Blockers   Reason not to administer Beta Blockers:Not Applicable 

## 2012-01-28 NOTE — ED Notes (Signed)
Left arm erythema and edema began Monday. Left arm edematous from elbow to finger tips, able to wiggle digits unable to make fist.

## 2012-01-28 NOTE — ED Provider Notes (Signed)
  Physical Exam  BP 148/73  Pulse 81  Temp(Src) 99 F (37.2 C) (Oral)  Resp 16  Wt 225 lb (102.059 kg)  SpO2 94%  Physical Exam  ED Course  Procedures  MDM Dr. Amedeo Plenty has seen the patient and is planning on taking him to the operating room for drainage of abscess. Spoken to Dr. Rockne Menghini with dry hospitalist who will see the patient and admit for the hyperglycemia, DM management, renal insuff and hyponatremia.  Rashidah [place],[facility]       Saddie Benders. Payden Bonus, MD 01/28/12 1704

## 2012-01-28 NOTE — Anesthesia Postprocedure Evaluation (Signed)
  Anesthesia Post-op Note  Patient: [name]  Procedure(s) Performed:  IRRIGATION AND DEBRIDEMENT EXTREMITY - irrigation and drainage left hand  Patient Location: PACU  Anesthesia Type: General  Level of Consciousness: awake, alert , oriented and patient cooperative  Airway and Oxygen Therapy: Patient Spontanous Breathing and Patient connected to nasal cannula oxygen  Post-op Pain: none  Post-op Assessment: Post-op Vital signs reviewed, Patient's Cardiovascular Status Stable, Respiratory Function Stable, No signs of Nausea or vomiting and Pain level controlled  Post-op Vital Signs: Reviewed and stable  Complications: No apparent anesthesia complications

## 2012-01-28 NOTE — Consult Note (Signed)
Reason for Consult: left hand pain Referring Physician: Emergency room physician  [name] is an 61 y.o. male.  HPI: Patient is 61 years of age. He is an insulin-dependent diabetic. He began having pain Monday in his left hand. He saw Dr. Shirline [name] Tuesday. He was placed on antibiotics in the form of doxycycline. His pain and problems continue to worsen. He presented to the emergency room today with a swollen hand and high white blood cell count as well as ascending cellulitis/lymphangitis. He has an MRI showing a high degree of fluid in the extensor sheath and in the midcarpal joint of his wrist. He has pain with any movement of the wrist or extensor apparatus. He has a history of hypertension and insulin-dependent diabetes. He denies any bites trauma or unusual exposures. He is here today with his wife. His condition has been steadily worsening. At present time he cannot move his hand. I have reviewed this at length. I should note he has lost a toe do 2 complications with diabetes in the past.  Past Medical History  Diagnosis Date  . Diabetes mellitus     History reviewed. No pertinent past surgical history.  History reviewed. No pertinent family history.  Social History:  reports that he has never smoked. He does not have any smokeless tobacco history on file. He reports that he does not drink alcohol or use illicit drugs.  Allergies:  Allergies  Allergen Reactions  . Penicillins Hives and Swelling    Medications: I have reviewed the patient's current medications.  Results for orders placed during the hospital encounter of 01/28/12 (from the past 48 hour(s))  GLUCOSE, CAPILLARY     Status: Abnormal   Collection Time   01/28/12 10:51 AM      Component Value Range Comment   Glucose-Capillary 183 (*) 70 - 99 (mg/dL)    Comment 1 Notify RN      Comment 2 Call MD NNP PA CNM      Comment 3 Documented in Chart     CBC     Status: Abnormal   Collection Time   01/28/12 11:10 AM        Component Value Range Comment   WBC 17.4 (*) 4.0 - 10.5 (K/uL)    RBC 3.87 (*) 4.22 - 5.81 (MIL/uL)    Hemoglobin 10.7 (*) 13.0 - 17.0 (g/dL)    HCT 31.8 (*) 39.0 - 52.0 (%)    MCV 82.2  78.0 - 100.0 (fL)    MCH 27.6  26.0 - 34.0 (pg)    MCHC 33.6  30.0 - 36.0 (g/dL)    RDW 14.5  11.5 - 15.5 (%)    Platelets 238  150 - 400 (K/uL)   BASIC METABOLIC PANEL     Status: Abnormal   Collection Time   01/28/12 11:10 AM      Component Value Range Comment   Sodium 128 (*) 135 - 145 (mEq/L)    Potassium 3.5  3.5 - 5.1 (mEq/L)    Chloride 92 (*) 96 - 112 (mEq/L)    CO2 25  19 - 32 (mEq/L)    Glucose, Bld 177 (*) 70 - 99 (mg/dL)    BUN 29 (*) 6 - 23 (mg/dL)    Creatinine, Ser 2.03 (*) 0.50 - 1.35 (mg/dL)    Calcium 9.2  8.4 - 10.5 (mg/dL)    GFR calc non Af Amer 34 (*) >90 (mL/min)    GFR calc Af Amer 39 (*) >90 (mL/min)  GLUCOSE, CAPILLARY     Status: Abnormal   Collection Time   01/28/12  2:42 PM      Component Value Range Comment   Glucose-Capillary 190 (*) 70 - 99 (mg/dL)    Comment 1 Notify RN      Comment 2 Documented in Chart       Dg Eye Foreign Body  01/28/2012  *RADIOLOGY REPORT*  Clinical Data: Metal working/exposure; clearance prior to MRI  ORBITS FOR FOREIGN BODY - 2 VIEW  Comparison:  None.  Findings:  There is no evidence of metallic foreign body within the orbits.  No significant bone abnormality identified.  Opacification of the left maxillary sinus and bilateral ethmoid sinuses is seen, consistent with sinusitis.  IMPRESSION: 1.  No evidence of metallic foreign body within the orbits. 2.  Left maxillary and bilateral ethmoid sinus disease incidentally noted.  Original Report Authenticated By: Marlaine Hind, M.D.   [name]  01/28/2012  *RADIOLOGY REPORT*  Clinical Data: Diffuse hand pain, swelling and erythema.  Possible insect bite 5 days ago.  Question infection.  MRI OF THE LEFT HAND WITHOUT CONTRAST  Technique:  Multiplanar, multisequence MR imaging was  performed. No intravenous contrast was administered.  Comparison: Radiographs 01/28/2012.  Findings: There is severe diffuse soft tissue swelling and edema throughout the hand.  Ill-defined fluid is present dorsally, measuring up to 1.8 cm in thickness.  This primarily lies superficial to the extensor tendons.  Proximal extent at the wrist is not imaged.  Distally, this extends into the proximal digits. Contrast was not administered, limiting assessment for focal fluid collections.  There is moderate fluid within the flexor and extensor tendon sheaths.  The wrist is incompletely imaged.  There is prominent fluid within the midcarpal compartment, deep to the extensor tendons. There is diffuse edema throughout the intrinsic hand muscles.  There is no evidence of bone destruction within the metacarpals, phalanges or distal carpal row.  All of the fingers are flexed, somewhat limiting distal phalangeal assessment.  IMPRESSION:  1.  Markedly abnormal study with extensive soft tissue edema and ill-defined fluid throughout the hand, especially dorsally. Findings are highly concerning for cellulitis, myofasciitis and associated tenosynovitis.  No discrete abscess is seen without contrast. 2.  The wrist is incompletely imaged.  Prominent fluid in the mid carpal compartment could reflect a reactive effusion although intra- articular infection cannot be excluded. 3.  No evidence of osteomyelitis.  These results were called by telephone on 01/28/2012  at  1440 hours to  Dr. Wilson Singer, who verbally acknowledged these results.  Original Report Authenticated By: Vivia Ewing, M.D.   Dg Hand Complete Left  01/28/2012  *RADIOLOGY REPORT*  Clinical Data: Hand pain and swelling.  Insect bite/envenomation.  LEFT HAND - COMPLETE 3+ VIEW  Comparison: None.  Findings: Marked soft tissue swelling is present involving the entire hand.  No destructive osseous lesions are identified. Fingers are flexed.  No radiopaque foreign body is  identified. Mild enthesopathy is present involving the dorsal ulnar aspect of the proximal phalanx of the small finger.  IMPRESSION: Marked soft tissue swelling of the hand without acute osseous abnormality.  Original Report Authenticated By: Dereck Ligas, M.D.    ROS Blood pressure 148/73, pulse 81, temperature 99 F (37.2 C), temperature source Oral, resp. rate 16, weight 102.059 kg (225 lb), SpO2 94.00%. Physical Exam physical dam reveals a black male alert and oriented in no acute distress. His left hand is swollen and tender  he has a standing saline rest and lymphangitis. He has pain with passive motion about the wrist suggestive of a septic wrist. He has a pain with any extensor movement. His mid palm hyperthenar and thenar space are stable on the palmar aspect of his hand. He does not exhibit any Kanavel's signs. He is quite tender and painful. His elbow and shoulder are nontender on the left upper extremity. His right upper extremity is neurovascularly intact without abnormality. His lower extremity examination is reviewed without obvious abscess or ascending cellulitis. Abdomen is nontender. He is and noted to have a clear chest without wheeze.   Assessment/Plan: Four-day history of pain swelling and worsening infectious parameters left hand. MRI is suggestive of abscess/infectious tenosynovitis about the extensor apparatus and wrist joint. Given his objective exam and the MRI which correlates with a high degree of fluid in the wrist as well as his worsening and high white count I would recommend a surgical irrigation and debridement. I discussed this with him his family at length. Our goal is to decompress the area involved and plan for a washout. I recommend IV antibiotics. I recommend medicine consultation given his diabetes and renal  parameters. Dr. Wilson Singer of the emergency room department has I discussed this with medicine.  We are planning surgery for your upper extremity. The risk and  benefits of surgery include risk of bleeding infection anesthesia damage to normal structures and failure of the surgery to accomplish its intended goals of relieving symptoms and restoring function with this in mind we'll going to proceed. I have specifically discussed with the patient the pre-and postoperative regime and the does and don'ts and risk and benefits in great detail. Risk and benefits of surgery also include risk of dystrophy chronic nerve pain failure of the healing process to go onto completion and other inherent risks of surgery The relavent the pathophysiology of the disease/injury process, as well as the alternatives for treatment and postoperative course of action has been discussed in great detail with the patient who desires to proceed.  We will do everything in our power to help you (the patient) restore function to the upper extremity. Is a pleasure to see this patient today. We'll proceed accordingly.  Paulene Floor 01/28/2012, 4:36 PM

## 2012-01-28 NOTE — Progress Notes (Addendum)
ANTIBIOTIC CONSULT NOTE - INITIAL  Pharmacy Consult for Primaxin & Vancomycin Indication: Left hand cellulitis with abscess  Allergies  Allergen Reactions  . Penicillins Hives and Swelling    Patient Measurements: Height: 6\' 2"  (188 cm) Weight: 225 lb (102.059 kg) IBW/kg (Calculated) : 82.2    Vital Signs: Temp: 100.1 F (37.8 C) (02/01 2154) BP: 135/67 mmHg (02/01 2154) Pulse Rate: 91  (02/01 2154) Intake/Output from previous day:   Intake/Output from this shift: Total I/O In: 1300 [I.V.:1300] Out: 10 [Blood:10]  Labs:  Basename 01/28/12 1110  WBC 17.4*  HGB 10.7*  PLT 238  LABCREA --  CREATININE 2.03*   Estimated Creatinine Clearance: 49.4 ml/min (by C-G formula based on Cr of 2.03). No results found for this basename: VANCOTROUGH:2,VANCOPEAK:2,VANCORANDOM:2,GENTTROUGH:2,GENTPEAK:2,GENTRANDOM:2,TOBRATROUGH:2,TOBRAPEAK:2,TOBRARND:2,AMIKACINPEAK:2,AMIKACINTROU:2,AMIKACIN:2, in the last 72 hours   Microbiology: No results found for this or any previous visit (from the past 720 hour(s)).  Medical History: Past Medical History  Diagnosis Date  . Diabetes mellitus   . HTN (hypertension)   . Osteomyelitis     Medications:  Scheduled:    . clindamycin (CLEOCIN) IV  900 mg Intravenous Once  . diazepam  5 mg Intravenous Once  . docusate sodium  100 mg Oral BID  . gentamicin  120 mg Intravenous Once  . HYDROmorphone      .  HYDROmorphone (DILAUDID) injection  1 mg Intravenous Once  .  HYDROmorphone (DILAUDID) injection  1 mg Intravenous Once  . insulin aspart  0-15 Units Subcutaneous Q4H  . ondansetron (ZOFRAN) IV  4 mg Intravenous Once  . sodium chloride  1,000 mL Intravenous Once  . vitamin C  1,000 mg Oral Daily   Infusions:    . lactated ringers    . DISCONTD: lactated ringers    . DISCONTD: lactated ringers     Assessment: 61 yr male with left hand infection - cellulitis with abscess.  S/P I&D of wound today.  Patient received Clindamycin 900mg  IV  @ 11:46 along with Gentamycin 120mg  @ 12:40. Clindamycin 600mg  IV x 1 given @ 19:30.  Patient also noted to have sinusitis.  CrCl (n) = 40 ml/min.  IV Vancomycin and Primaxin to be initiated post-op for left hand cellulitis with abscess  Goal of Therapy:   Vancomycin trough level 10-15 mcg/ml  Plan:  1.  Primaxin 250mg  IV q6h 2.  Vancomycin 1500mg  IV q24h 3.  Watch renal function and check vancomycin trough level as needed.  Tyleek Smick, Toribio Harbour 01/28/2012,10:52 PM

## 2012-01-28 NOTE — Anesthesia Postprocedure Evaluation (Signed)
  Anesthesia Post-op Note  Patient: [name]  Procedure(s) Performed:  IRRIGATION AND DEBRIDEMENT EXTREMITY - irrigation and drainage left hand  Patient Location: PACU   Anesthesia Type: General  Level of Consciousness: awake and alert   Airway and Oxygen Therapy: Patient Spontanous Breathing  Post-op Pain: mild  Post-op Assessment: Post-op Vital signs reviewed, Patient's Cardiovascular Status Stable, Respiratory Function Stable, Patent Airway and No signs of Nausea or vomiting  Post-op Vital Signs: stable  Complications: No apparent anesthesia complications

## 2012-01-28 NOTE — Transfer of Care (Signed)
Immediate Anesthesia Transfer of Care Note  Patient: [name]  Procedure(s) Performed:  IRRIGATION AND DEBRIDEMENT EXTREMITY - irrigation and drainage left hand  Patient Location: PACU  Anesthesia Type: General  Level of Consciousness: awake, alert , oriented and patient cooperative  Airway & Oxygen Therapy: Patient Spontanous Breathing and Patient connected to face mask oxygen  Post-op Assessment: Report given to PACU RN and Post -op Vital signs reviewed and stable  Post vital signs: Reviewed and stable  Complications: No apparent anesthesia complications

## 2012-01-28 NOTE — ED Notes (Signed)
Consent signed by patient.  Med. Given for pain per MD verbal order.

## 2012-01-29 LAB — RETICULOCYTES: Retic Count, Absolute: 24.8 10*3/uL (ref 19.0–186.0)

## 2012-01-29 LAB — GLUCOSE, CAPILLARY
Glucose-Capillary: 149 mg/dL — ABNORMAL HIGH (ref 70–99)
Glucose-Capillary: 257 mg/dL — ABNORMAL HIGH (ref 70–99)
Glucose-Capillary: 195 mg/dL — ABNORMAL HIGH (ref 70–99)

## 2012-01-29 LAB — FERRITIN: Ferritin: 707 ng/mL — ABNORMAL HIGH (ref 22–322)

## 2012-01-29 LAB — HEMOGLOBIN A1C
Hgb A1c MFr Bld: 7.4 % — ABNORMAL HIGH (ref <5.7)
Mean Plasma Glucose: 166 mg/dL — ABNORMAL HIGH (ref <117)

## 2012-01-29 LAB — CBC
HCT: 28.1 % — ABNORMAL LOW (ref 39.0–52.0)
Hemoglobin: 9.2 g/dL — ABNORMAL LOW (ref 13.0–17.0)
MCH: 27.1 pg (ref 26.0–34.0)
MCV: 82.9 fL (ref 78.0–100.0)
Platelets: 235 10*3/uL (ref 150–400)
RBC: 3.39 MIL/uL — ABNORMAL LOW (ref 4.22–5.81)
WBC: 15.3 10*3/uL — ABNORMAL HIGH (ref 4.0–10.5)

## 2012-01-29 LAB — URINALYSIS, ROUTINE W REFLEX MICROSCOPIC
Glucose, UA: 250 mg/dL — AB
Leukocytes, UA: NEGATIVE
Protein, ur: >300 mg/dL — AB
Specific Gravity, Urine: 1.027 (ref 1.005–1.030)
pH: 6 (ref 5.0–8.0)

## 2012-01-29 LAB — COMPREHENSIVE METABOLIC PANEL
AST: 100 U/L — ABNORMAL HIGH (ref 0–37)
Albumin: 2 g/dL — ABNORMAL LOW (ref 3.5–5.2)
BUN: 27 mg/dL — ABNORMAL HIGH (ref 6–23)
Calcium: 8.3 mg/dL — ABNORMAL LOW (ref 8.4–10.5)
Chloride: 95 mEq/L — ABNORMAL LOW (ref 96–112)
Creatinine, Ser: 1.98 mg/dL — ABNORMAL HIGH (ref 0.50–1.35)
Total Protein: 6.9 g/dL (ref 6.0–8.3)

## 2012-01-29 LAB — DIFFERENTIAL
Basophils Absolute: 0 10*3/uL (ref 0.0–0.1)
Basophils Relative: 0 % (ref 0–1)
Eosinophils Absolute: 0.2 10*3/uL (ref 0.0–0.7)
Lymphs Abs: 1.5 10*3/uL (ref 0.7–4.0)
Neutro Abs: 12.6 10*3/uL — ABNORMAL HIGH (ref 1.7–7.7)

## 2012-01-29 LAB — URINE MICROSCOPIC-ADD ON

## 2012-01-29 LAB — IRON AND TIBC
Iron: <10 ug/dL — ABNORMAL LOW (ref 42–135)
UIBC: 177 ug/dL (ref 125–400)

## 2012-01-29 LAB — VITAMIN B12: Vitamin B-12: 613 pg/mL (ref 211–911)

## 2012-01-29 LAB — FOLATE: Folate: 16.3 ng/mL

## 2012-01-29 MED ORDER — VANCOMYCIN HCL 1000 MG IV SOLR
1500.0000 mg | INTRAVENOUS | Status: DC
  Administered 2012-01-29 – 2012-01-31 (×3): 1500 mg via INTRAVENOUS
  Filled 2012-01-29 (×5): qty 1500

## 2012-01-29 MED ORDER — ACETAMINOPHEN 650 MG RE SUPP: 650.0000 mg | Freq: Four times a day (QID) | RECTAL | Status: DC | PRN

## 2012-01-29 MED ORDER — OXYCODONE HCL 5 MG PO TABS: 5.0000 mg | ORAL_TABLET | ORAL | Status: DC | PRN

## 2012-01-29 MED ORDER — ONDANSETRON HCL 4 MG PO TABS: 4.0000 mg | ORAL_TABLET | Freq: Four times a day (QID) | ORAL | Status: DC | PRN

## 2012-01-29 MED ORDER — POTASSIUM CHLORIDE IN NACL 20-0.9 MEQ/L-% IV SOLN
INTRAVENOUS | Status: DC
  Administered 2012-01-29 – 2012-01-30 (×3): via INTRAVENOUS
  Filled 2012-01-29 (×7): qty 1000

## 2012-01-29 MED ORDER — AMLODIPINE BESYLATE 5 MG PO TABS
5.0000 mg | ORAL_TABLET | Freq: Every day | ORAL | Status: DC
  Administered 2012-01-29 – 2012-01-30 (×2): 5 mg via ORAL
  Filled 2012-01-29 (×2): qty 1

## 2012-01-29 MED ORDER — HYDROMORPHONE HCL PF 1 MG/ML IJ SOLN
0.5000 mg | INTRAMUSCULAR | Status: DC | PRN
  Administered 2012-01-29 – 2012-02-01 (×4): 0.5 mg via INTRAVENOUS
  Administered 2012-02-01: 0.25 mg via INTRAVENOUS
  Administered 2012-02-01 – 2012-02-03 (×9): 0.5 mg via INTRAVENOUS
  Filled 2012-01-29 (×11): qty 1

## 2012-01-29 MED ORDER — INSULIN GLARGINE 100 UNIT/ML ~~LOC~~ SOLN
15.0000 [IU] | Freq: Every day | SUBCUTANEOUS | Status: DC
  Administered 2012-01-29 – 2012-02-03 (×6): 15 [IU] via SUBCUTANEOUS
  Filled 2012-01-29: qty 3

## 2012-01-29 MED ORDER — SODIUM CHLORIDE 0.9 % IV SOLN
250.0000 mg | Freq: Four times a day (QID) | INTRAVENOUS | Status: DC
  Administered 2012-01-29 – 2012-01-31 (×10): 250 mg via INTRAVENOUS
  Filled 2012-01-29 (×14): qty 250

## 2012-01-29 MED ORDER — ONDANSETRON HCL 4 MG/2ML IJ SOLN
4.0000 mg | Freq: Four times a day (QID) | INTRAMUSCULAR | Status: DC | PRN
  Administered 2012-01-29: 4 mg via INTRAVENOUS
  Filled 2012-01-29: qty 2

## 2012-01-29 MED ORDER — POLYETHYLENE GLYCOL 3350 17 G PO PACK
17.0000 g | PACK | Freq: Every day | ORAL | Status: DC | PRN
  Filled 2012-01-29: qty 1

## 2012-01-29 MED ORDER — ACETAMINOPHEN 325 MG PO TABS
650.0000 mg | ORAL_TABLET | Freq: Four times a day (QID) | ORAL | Status: DC | PRN
  Administered 2012-01-29 – 2012-01-30 (×4): 650 mg via ORAL
  Filled 2012-01-29 (×4): qty 2

## 2012-01-29 NOTE — Progress Notes (Signed)
Pt has a temp of 102.7 F, HR 89, R 18 BP 171/78 and O2 sat of 98.  Gave 650 mg of acetaminophen as ordered for fever and notified MD.  Pt resting comfortably and  A & O x3.  [name] [facility] 01/29/2012 6:27 AM

## 2012-01-29 NOTE — Plan of Care (Signed)
Problem: Phase I Progression Outcomes Goal: OOB as tolerated unless otherwise ordered Outcome: Completed/Met Date Met:  01/29/12 OOB with [place], OTR/L [phone] 01/29/2012

## 2012-01-29 NOTE — Progress Notes (Signed)
On call informed of patients temp of 101.6, tylenol given. Also informed of difficulty obtaining CBG, we have attempted 8 times by finger stick with no results. On call suggested warm packs, which we will do, and continue to attempt finger sticks.

## 2012-01-29 NOTE — Evaluation (Signed)
Occupational Therapy Evaluation Patient Details Name: [name] MRN: [id number] DOB: [birth year] Today's Date: 01/29/2012 8:58-9:48  Problem List:  Patient Active Problem List  Diagnoses  . Diabetes mellitus  . Cellulitis and abscess of hand  . HTN (hypertension)  . ARF (acute renal failure)  . Hyponatremia  . Normocytic anemia  . Sinusitis    Past Medical History:  Past Medical History  Diagnosis Date  . Diabetes mellitus   . HTN (hypertension)   . Osteomyelitis    Past Surgical History:  Past Surgical History  Procedure Date  . 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 Cholecystectomy   . Tonsillectomy     OT Assessment/Plan/Recommendation OT Assessment Clinical Impression Statement: This 61 yo s/p sepsis of LUE with extensive I & D as well as compartmental releases presents to acute OT with problems below thus affecting pt's PLOF at I with BADLs/IADLs. WIll continue to benefit from acute OT with follow OT at OP as MD deems necessary. Willl follow. OT Recommendation/Assessment: Patient will need skilled OT in the acute care venue OT Problem List: Decreased range of motion;Impaired balance (sitting and/or standing);Impaired UE functional use;Increased edema;Pain Barriers to Discharge: None OT Therapy Diagnosis : Acute pain OT Plan OT Frequency: Min 3X/week OT Treatment/Interventions: Self-care/ADL training;Manual therapy;Therapeutic activities;Patient/family education;Balance training OT Recommendation Follow Up Recommendations: Outpatient OT (for hand/arm if Dr. Amedeo Plenty orders) Equipment Recommended: None recommended by OT Individuals Consulted Consulted and Agree with Results and Recommendations: Patient OT Goals Acute Rehab OT Goals OT Goal Formulation: With patient Time For Goal Achievement: 2 weeks ADL Goals Pt Will Perform Grooming: with modified independence;Unsupported;Standing at sink ADL Goal: Grooming - Progress: Goal set today Pt Will Perform Upper Body  Bathing: with min assist;Standing at sink;Unsupported ADL Goal: Upper Body Bathing - Progress: Goal set today Pt Will Perform Lower Body Bathing: with set-up;with supervision;Unsupported;Other (comment) (sit to stand at sink) ADL Goal: Lower Body Bathing - Progress: Goal set today Pt Will Perform Upper Body Dressing: with set-up;with supervision;Sit to stand from bed;Unsupported;Sit to stand from chair ADL Goal: Upper Body Dressing - Progress: Goal set today Pt Will Perform Lower Body Dressing: with set-up;with supervision;Sit to stand from bed;Sit to stand from chair;Unsupported ADL Goal: Lower Body Dressing - Progress: Goal set today Pt Will Transfer to Toilet: with modified independence;Ambulation;Regular height toilet ADL Goal: Toilet Transfer - Progress: Goal set today Pt Will Perform Toileting - Clothing Manipulation: with modified independence;Standing ADL Goal: Toileting - Clothing Manipulation - Progress: Goal set today Arm Goals Pt Will Perform AROM: Independently;Left upper extremity;1 set;10 reps;Other (comment) (shoulder, wrist, digits LUE---5 times a day)  OT Evaluation Precautions/Restrictions  Precautions Required Braces or Orthoses: No Restrictions Weight Bearing Restrictions: Yes LUE Weight Bearing: Non weight bearing Other Position/Activity Restrictions: keep elevated above heart on pillows or mission sling.  Pt said he would like to try the mission sling since the pillows tend to shift around quite a bit--RN ([place]) made aware. Prior Functioning Home Living Lives With: Spouse Receives Help From: Family (if needed someone can be there prn) Type of Home: House Home Layout: Two level Alternate Level Stairs-Rails: Left Alternate Level Stairs-Number of Steps: 10 Home Access: Stairs to enter Entrance Stairs-Rails: None Entrance Stairs-Number of Steps: 2 Bathroom Shower/Tub: Child psychotherapist Equipment: None Prior  Function Level of Independence: Independent with basic ADLs;Independent with homemaking with ambulation;Independent with gait;Independent with transfers Driving: Yes Vocation: Full time employment ADL ADL Eating/Feeding: Performed;Set up Eating/Feeding Details (indicate cue type  and reason): due to cannot use LUE functionally Where Assessed - Eating/Feeding: Bed level Grooming: Simulated;Set up Grooming Details (indicate cue type and reason): Due to decreased use of LUE Where Assessed - Grooming: Sitting, bed Upper Body Bathing: Simulated;Minimal assistance Upper Body Bathing Details (indicate cue type and reason): Due to decreased use of LUE Where Assessed - Upper Body Bathing: Unsupported;Sit to stand from bed Lower Body Bathing: Simulated;Minimal assistance Lower Body Bathing Details (indicate cue type and reason): Due to decreased use of LUE Where Assessed - Lower Body Bathing: Sit to stand from bed;Supported (supported for sit to stand and standing) Upper Body Dressing: Simulated;Moderate assistance Upper Body Dressing Details (indicate cue type and reason): Due to decreased use of LUE Where Assessed - Upper Body Dressing: Sitting, bed;Unsupported Lower Body Dressing: Performed;Maximal assistance;Other (comment) (socks) Lower Body Dressing Details (indicate cue type and reason): Due to decreased use of LUE Where Assessed - Lower Body Dressing: Supported;Sit to stand from bed (support for sit to stand and standing) Toilet Transfer: Simulated;Minimal assistance;Other (comment) (Bed around to recliner) Toilet Transfer Method: Ambulating Toileting - Clothing Manipulation: Minimal assistance Toileting - Clothing Manipulation Details (indicate cue type and reason): Due to decreased use of LUE Where Assessed - Toileting Clothing Manipulation: Standing Toileting - Hygiene: Simulated;Independent Where Assessed - Toileting Hygiene: Standing Tub/Shower Transfer: Not assessed Tub/Shower  Transfer Method: Not assessed Equipment Used:  (pushed IV pole) Ambulation Related to ADLs: min A pushing IV pole Vision/Perception  Vision - History Baseline Vision: No visual deficits Patient Visual Report: No change from baseline Cognition Cognition Arousal/Alertness: Awake/alert Overall Cognitive Status: Appears within functional limits for tasks assessed Orientation Level: Oriented X4 Sensation/Coordination Sensation Light Touch: Appears Intact Coordination Gross Motor Movements are Fluid and Coordinated: No Fine Motor Movements are Fluid and Coordinated: No Coordination and Movement Description: LUE gross coordination impaired due to dressing on arm. Limited movement of digits LUE due to dressing and overall decreased AROM---PROM is more, but still not full range within contraints of bandaging without pain Extremity Assessment RUE Assessment RUE Assessment: Within Functional Limits LUE Assessment LUE Assessment: Exceptions to [facility] LUE AROM (degrees) LUE Overall AROM Comments: Pt AAROM for shoulder full;  AAROM elbow full extension only about 90 degrees of flexion; Digit 2-5 minimal AROM, can achieve increased PROM however noted pain with 3rd digit and it is swollen compared to the other digits. Mobility  Bed Mobility Bed Mobility: Yes Supine to Sit: 6: Modified independent (Device/Increase time);With rails;HOB elevated (Comment degrees) (40 degrees) Sitting - Scoot to Edge of Bed: 6: Modified independent (Device/Increase time);With rail Transfers Transfers: Yes Sit to Stand: 4: Min assist;With upper extremity assist;From chair/3-in-1;Other (comment) (Using RUE only) Stand to Sit: 5: Supervision;With upper extremity assist;With armrests;To chair/3-in-1;Other (comment) (cues to reach back with RUE for arm rest of chair) Exercises General Exercises - Upper Extremity Shoulder Flexion: AAROM;10 reps;Left;Other (comment) (encouraged pt to do this throughout the day) Shoulder  Extension: AAROM;Left;10 reps;Other (comment) (encouraged pt to do this throughout the day) Elbow Flexion: AAROM;Left;10 reps;Other (comment) (encouraged pt to do this throughout the day) Elbow Extension: AAROM;Left;10 reps;Other (comment) (encouraged pt to do this throughout the day) Digit Composite Flexion: AROM;Left;10 reps;Other (comment) (encouraged pt to do this throughout the day) Composite Extension: AROM;Left;10 reps;Other (comment) (encouraged pt to do this throughout the day) Other Exercises Other Exercises: Explained to patient the importance of keeping his LUE elevated above the heart when he is in the bed or chair. End of Session OT - End of Session  Activity Tolerance: Patient tolerated treatment well Patient left: in chair;with call bell in reach;Other (comment) (LUE propped above heart) Nurse Communication: Mobility status for transfers;Mobility status for ambulation (let tech know) General Behavior During Session: [facility] for tasks performed Cognition: [facility] for tasks performed   Almon Register [id number]       01/29/2012, 10:16 AM

## 2012-01-29 NOTE — Anesthesia Preprocedure Evaluation (Addendum)
Anesthesia Evaluation  Patient identified by MRN, date of birth, ID band Patient awake    Reviewed: Allergy & Precautions, H&P , NPO status , Patient's Chart, lab work & pertinent test results  Airway Mallampati: II TM Distance: >3 FB Neck ROM: full    Dental  (+) Missing and Chipped All front teeth chipped and many back teeth missing:   Pulmonary neg pulmonary ROS,  clear to auscultation  Pulmonary exam normal       Cardiovascular Exercise Tolerance: Good hypertension, On Medications neg cardio ROS regular Normal    Neuro/Psych Negative Neurological ROS  Negative Psych ROS   GI/Hepatic negative GI ROS, Neg liver ROS,   Endo/Other  Negative Endocrine ROSDiabetes mellitus-, Well Controlled, Type 2, Insulin Dependent  Renal/GU negative Renal ROS  Genitourinary negative   Musculoskeletal   Abdominal   Peds  Hematology negative hematology ROS (+)   Anesthesia Other Findings   Reproductive/Obstetrics negative OB ROS                          Anesthesia Physical Anesthesia Plan  ASA: III  Anesthesia Plan: General   Post-op Pain Management:    Induction: Intravenous  Airway Management Planned: LMA  Additional Equipment:   Intra-op Plan:   Post-operative Plan:   Informed Consent: I have reviewed the patients History and Physical, chart, labs and discussed the procedure including the risks, benefits and alternatives for the proposed anesthesia with the patient or authorized representative who has indicated his/her understanding and acceptance.   Dental Advisory Given  Plan Discussed with: CRNA and Surgeon  Anesthesia Plan Comments:         Anesthesia Quick Evaluation

## 2012-01-29 NOTE — Progress Notes (Signed)
Subjective: 1 Day Post-Op Procedure(s) (LRB): IRRIGATION AND DEBRIDEMENT EXTREMITY (Left) The patient is awake today and doing fairly well he has postoperative pain about the hand as expected but overall is improved compared to his preoperative status. He does state he feels slightly feverish but overall improved from yesterday denies a nozzle vomiting shortness of breath chest pain the    Objective: Vital signs in last 24 hours: Temp:  [99 F (37.2 C)-102.7 F (39.3 C)] 102.7 F (39.3 C) (02/02 0600) Pulse Rate:  [89-108] 89  (02/02 0600) Resp:  [16-24] 18  (02/02 0600) BP: (135-188)/(67-96) 171/78 mmHg (02/02 0600) SpO2:  [93 %-100 %] 93 % (02/02 0938)  Intake/Output from previous day: 02/01 0701 - 02/02 0700 In: 2300 [I.V.:2300] Out: 10 [Blood:10] Intake/Output this shift: Total I/O In: 480 [P.O.:480] Out: 300 [Urine:300]   Basename 01/29/12 0310 01/28/12 2326 01/28/12 1110  HGB 9.2* 9.2* 10.7*    Basename 01/29/12 0310 01/28/12 2326  WBC 15.3* 15.4*  RBC 3.39* 3.34*  HCT 28.1* 27.8*  PLT 235 237    Basename 01/29/12 0310 01/28/12 2326  NA 128* 129*  K 3.6 3.6  CL 95* 96  CO2 22 21  BUN 27* 28*  CREATININE 1.98* 2.04*  GLUCOSE 181* 158*  CALCIUM 8.3* 8.5   No results found for this basename: LABPT:2,INR:2 in the last 72 hours  Patient is alert and oriented x3 in no acute distress  Head is atraumatic normocephalic  Chest has equal bilateral expansions respirations are non-labored  Left upper extremity shows that his splint is clean dry and intact. Digital range of motion is tender we have demonstrated to him active and passive range of motion. He has mild swelling of the digits present. Neurovascular he is intact.  Assessment/Plan: 1 Day Post-Op Procedure(s) (LRB): IRRIGATION AND DEBRIDEMENT EXTREMITY (Left) Diabetes mellitus Cellulitis and abscess of hand HTN (hypertension) ARF (acute renal failure) Hyponatremia Normocytic anemia Sinusitis  We have  discussed with the patient the severity of his infectious findings intraoperatively.  Plan is to proceed to the operating room tomorrow for a repeat irrigation and debridement. We will await his final cultures to determine the appropriate antibiotics however we will continue his current antibiotic regime at this time. Patient needs to diligently work on active and passive range of motion of the digits to prevent stiffening and increased swelling. We discussed this with him at length today, all questions were encouraged and answered. Once his soft tissues allow we will proceed with daily dressing changes and wound care.  Sheenah Dimitroff L 01/29/2012, 2:02 PM

## 2012-01-29 NOTE — Op Note (Signed)
[name], [name] NO.:  [id number]  MEDICAL RECORD NO.:  [id number]  LOCATION:  52                         FACILITY:  [facility]  PHYSICIAN:  [name], M.D.DATE OF BIRTH:  1951/12/25  DATE OF PROCEDURE: DATE OF DISCHARGE:                              OPERATIVE REPORT   PREOPERATIVE DIAGNOSIS:  Sepsis left wrist and hand in multiple areas including the wrist joint, flexor and extensor surfaces/apparatus.  POSTOPERATIVE DIAGNOSES: 1. Significant purulence in the carpal canal, midpalmar space and     along the flexor sheath involving the ring and small finger as well     as the flexor apparatus about the wrist.  Severe septic midcarpal     and radiocarpal wrist joints requiring synovectomy, arthrotomy. 2. Extensor tendon purulent tenosynovitis about the wrist and hand.  SURGICAL PROCEDURES PERFORMED: 1. Irrigation and debridement abscess left dorsal wrist deep in     nature. 2. Extensive extensor tendon tenosynovectomy radical in nature     including the 2nd through 5th dorsal compartments, left wrist. 3. Arthrotomy, synovectomy radiocarpal and midcarpal joints extensive     in nature secondary septic wrist findings. 4. Posterior interosseous nerve neurectomy, left wrist. 5. Open carpal tunnel release volar aspect, left wrist. 6. Radical flexor tenosynovectomy, left wrist. 7. Irrigation and debridement midpalmar space abscess, left wrist. 8. Irrigation and debridement flexor digitorum profundus and     superficialis with extensive tenosynovectomy about the small     finger. 9. Irrigation and debridement flexor digitorum profundus and flexor     digitorum superficialis to the ring finger with extensive     tenosynovectomy.  SURGEON:  [name], M.D.  ASSISTANT:  None.  COMPLICATIONS:  None.  ANESTHESIA:  General.  TOURNIQUET TIME:  Less than an hour.  INDICATIONS FOR THE PROCEDURE:  This patient is a pleasant male who presents with  the above-mentioned diagnoses.  He has had high white count.  He has had progressive infection over the last 5 days and worsening symptoms.  He was seen by [name], placed on antibiotics Tuesday in the form of doxycycline.  He presented to the emergency room with severe pain.  He was evaluated.  He has high white blood cell count, high blood sugars, and early findings of acute renal failure with high BUN and creatinine.  MRI suggests massive sepsis in the wrist according to my read.  I have interpreted the MRI and discussed with the family, the patient, and the hospitalist his findings.  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  I recommended proceeding to the operative arena ASAP.  He was consented and brought to the operative suite understanding and accepting the risks and benefits of surgery including risk of infection, bleeding, anesthesia, damage to normal structures, and failure of surgery to accomplish its intended goals of relieving symptoms and restoring function.  OPERATIVE PROCEDURE:  The patient seen by myself and [name], the anesthesiologist.  Following this, he was taken to the operative arena and underwent a speed induction of general anesthesia.  Permit was signed, arm marked, pre and postop check list complete, and time-out was called.  Once in the operative arena, he was prepped, draped in usual sterile fashion.  Betadine scrub and paint about the left wrist.  Initially, I embarked with an incision about the dorsal aspect of the hand.  Once I performed a 1 inch incision, I dissected down and opened up the 4th and 5th dorsal compartments.  Fluid egressed from this of a purulent nature.  I performed an extensor tenosynovectomy in this region.  Once this was completed, I made a second incision overlying the wrist joint.   The patient had a deep abscess removed from just underneath the extensor apparatus and this was then tracked down to the wrist joint.  At this time, I opened the wrist joint.  The capsule about the midcarpal joint was opened up and the patient had purulence under pressure, which was cultured for aerobic and anaerobic cultures as well as fungal and atypical mycobacterial cultures.  This was rather exuberant and very impressive.  Once this was done, I then made a separate incision in the radiocarpal joint.  I took care to avoid injury to the scapholunate interosseous ligament, but made sure that both the radiocarpal, midcarpal, and the CMC joints of the 2nd and 3rd CMC regions underwent an I and D with arthrotomy, synovectomy.  Once this was done, I performed a posterior interosseous nerve neurectomy.  During the course of this, I performed an extensor tenosynovectomy of the EPL, ECRB, ECRL, EDC to the index through small fingers, and EDM to the small finger.  The patient had an exuberant amount of purulence.  This was incredibly impressive.  Thus the patient underwent I and D of the deep abscess in the hand, followed by I and D of septic wrist joint including radiocarpal, midcarpal, and CMC joints.  The patient underwent radical extensor tenosynovectomy as well.  The patient also underwent posterior interosseous nerve neurectomy.  Once this was completed, I then placed 3 L of saline through this area. This was done with cysto tubing and I made sure he was irrigated copiously.  Following this, I had continued concerns due to his presentation about the volar apparatus and thus made a small incision just proximal to the distal wrist crease.  I opened this area up and immediately encountered purulence in the flexor tendon sheath.  This was unfortunate, but certainly very impressive.  At this time, I performed an extension of the incision and following this due to the purulence under  pressure extended the incision into the palm region and performed an open carpal tunnel release.  The carpal tunnel was released without difficulty.  The transverse carpal ligament was released in its entirety.  This was done with 4.0 loupe magnification and the median nerve was carefully protected and all pressure was removed from this.  This allowed for the egression of significant purulence.  Following this, I then performed a radical tenosynovectomy of left wrist.  FDP, FDS, and the flexor pollicis longus underwent I and D with removal of tenosynovium.  There was a large amount of thick purulence and this was cultured as well from the volar aspect.  Following this, I made a transverse incision in the area about the distal palmar crease, dissected down, and performed the flexor digitorum profundus and flexor digitorum superficialis tenosynovectomy to the ring finger, followed by flexor digitorum superficialis and flexor digitorum profundus tenosynovectomy to the small finger secondary to purulence noted on the  MRI scan.  Following this, I very gently dissected into the midpalmar space, decompressed this as well.  The patient had an extensive operation.  He had 4 separate incisions and open carpal tunnel release, both extensor and flexor tenosynovectomies, which were extensive as well as an open carpal tunnel release and I and D of the small and ring finger, FDP, and FDS.  The radical tenosynovectomy was accomplished about the wrist and forearm and through a separate incision from the ring and small finger, FDP and FDS tenosynovectomy.  This was unfortunately a very pervasive infection for the patient.  I placed Penrose drains to all 4 by operative sites and I placed 3 L of warm saline to the volar wounds.  A total of 7-8 L in total was used to irrigate the wound.  I left the wounds completely open to drain and we will plan to return to the operative arena Sunday.  We will monitor  his white count and his condition very closely.  Given all issues as well as general medical condition, I would give him a guarded prognosis at this point.  We are going to do everything in our power to try to give him the best upper extremity possible; however, this is a rather severe infection with impressive findings both dorsally and volarly, which makes it even more concerning for [place].  These notes have been discussed and all questions have been encouraged and answered.  Should any problems arise, we will be available immediately.     [name], M.D.     [facility]  D:  01/28/2012  T:  01/29/2012  Job:  [id number]  cc:   Marchia Bond, M.D. Fax: [phone]  Vivia Ewing, M.D. Fax: [phone]

## 2012-01-29 NOTE — Progress Notes (Signed)
Subjective:  -Pain controlled.    Antibiotics: Vanc: 2.1.2013>> Imipenem; 2.1.2013>> Objective: Filed Vitals:   01/28/12 2130 01/28/12 2154 01/28/12 2222 01/29/12 0600  BP: 161/74 135/67  171/78  Pulse: 95 91  89  Temp: 99.8 F (37.7 C) 100.1 F (37.8 C)  102.7 F (39.3 C)  TempSrc:    Oral  Resp: 22 20  18   Height:   6\' 2"  (1.88 m)   Weight:      SpO2: 98% 97%  98%   Weight change:   Intake/Output Summary (Last 24 hours) at 01/29/12 0753 Last data filed at 01/28/12 2130  Gross per 24 hour  Intake   2300 ml  Output     10 ml  Net   2290 ml    General: Alert, awake, oriented x3, in no acute distress.  HEENT: No bruits, no goiter.  Heart: Regular rate and rhythm, without murmurs, rubs, gallops.  Lungs: good air movement CTA B/L. Abdomen: Soft, nontender, nondistended, positive bowel sounds.  Neuro: able to move finger of left hand. But there is still pain with passive movement. Good vascular filling.   Lab Results:  Basename 01/29/12 0310 01/28/12 2326 01/28/12 1110  NA 128* 129* 128*  K 3.6 3.6 3.5  CL 95* 96 92*  CO2 22 21 25   GLUCOSE 181* 158* 177*  BUN 27* 28* 29*  CREATININE 1.98* 2.04* 2.03*  CALCIUM 8.3* 8.5 9.2  MG -- -- --  PHOS -- -- --    Basename 01/29/12 0310 01/28/12 2326  AST 100* 73*  ALT 79* 66*  ALKPHOS 95 76  BILITOT 0.3 0.3  PROT 6.9 7.0  ALBUMIN 2.0* 2.0*    Basename 01/29/12 0310 01/28/12 2326  WBC 15.3* 15.4*  NEUTROABS -- 12.6*  HGB 9.2* 9.2*  HCT 28.1* 27.8*  MCV 82.9 83.2  PLT 235 237    Basename 01/28/12 1110  HGBA1C 7.4*   Micro Results: Recent Results (from the past 240 hour(s))  CULTURE, ROUTINE-ABSCESS     Status: Normal (Preliminary result)   Collection Time   01/28/12  7:16 PM      Component Value Range Status Comment   Specimen Description HAND   Final    Special Requests NONE   Final    Gram Stain PENDING   Incomplete    Culture NO GROWTH   Final    Report Status PENDING   Incomplete   CULTURE,  ROUTINE-ABSCESS     Status: Normal (Preliminary result)   Collection Time   01/28/12  7:20 PM      Component Value Range Status Comment   Specimen Description HAND   Final    Special Requests NONE   Final    Gram Stain     Final    Value: RARE WBC PRESENT,BOTH PMN AND MONONUCLEAR     NO SQUAMOUS EPITHELIAL CELLS SEEN     NO ORGANISMS SEEN   Culture NO GROWTH   Final    Report Status PENDING   Incomplete   ANAEROBIC CULTURE     Status: Normal (Preliminary result)   Collection Time   01/28/12  7:20 PM      Component Value Range Status Comment   Specimen Description HAND   Final    Special Requests NONE   Final    Gram Stain     Final    Value: RARE WBC PRESENT,BOTH PMN AND MONONUCLEAR     NO SQUAMOUS EPITHELIAL CELLS SEEN     NO ORGANISMS  SEEN   Culture PENDING   Incomplete    Report Status PENDING   Incomplete   CULTURE, ROUTINE-ABSCESS     Status: Normal (Preliminary result)   Collection Time   01/28/12  7:22 PM      Component Value Range Status Comment   Specimen Description WRIST   Final    Special Requests NONE   Final    Gram Stain PENDING   Incomplete    Culture NO GROWTH   Final    Report Status PENDING   Incomplete     Studies/Results: Dg Eye Foreign Body  01/28/2012  *RADIOLOGY REPORT*  Clinical Data: Metal working/exposure; clearance prior to MRI  ORBITS FOR FOREIGN BODY - 2 VIEW  Comparison:  None.  Findings:  There is no evidence of metallic foreign body within the orbits.  No significant bone abnormality identified.  Opacification of the left maxillary sinus and bilateral ethmoid sinuses is seen, consistent with sinusitis.  IMPRESSION: 1.  No evidence of metallic foreign body within the orbits. 2.  Left maxillary and bilateral ethmoid sinus disease incidentally noted.  Original Report Authenticated By: Marlaine Hind, M.D.   Dg Chest 2 View  01/28/2012  *RADIOLOGY REPORT*  Clinical Data: Preoperative exam for left arm surgery.  Diabetes and hypertension.  CHEST - 2 VIEW   Comparison: 08/12/2010  Findings: The heart size is stable and at the upper limits of normal.  Both lungs are clear.  No evidence of pleural effusion. No mass or lymphadenopathy identified.  Mild thoracic dextroscoliosis remains stable.  IMPRESSION: No active cardiopulmonary disease.  Original Report Authenticated By: Marlaine Hind, M.D.   [name]  01/28/2012  *RADIOLOGY REPORT*  Clinical Data: Diffuse hand pain, swelling and erythema.  Possible insect bite 5 days ago.  Question infection.  MRI OF THE LEFT HAND WITHOUT CONTRAST  Technique:  Multiplanar, multisequence MR imaging was performed. No intravenous contrast was administered.  Comparison: Radiographs 01/28/2012.  Findings: There is severe diffuse soft tissue swelling and edema throughout the hand.  Ill-defined fluid is present dorsally, measuring up to 1.8 cm in thickness.  This primarily lies superficial to the extensor tendons.  Proximal extent at the wrist is not imaged.  Distally, this extends into the proximal digits. Contrast was not administered, limiting assessment for focal fluid collections.  There is moderate fluid within the flexor and extensor tendon sheaths.  The wrist is incompletely imaged.  There is prominent fluid within the midcarpal compartment, deep to the extensor tendons. There is diffuse edema throughout the intrinsic hand muscles.  There is no evidence of bone destruction within the metacarpals, phalanges or distal carpal row.  All of the fingers are flexed, somewhat limiting distal phalangeal assessment.  IMPRESSION:  1.  Markedly abnormal study with extensive soft tissue edema and ill-defined fluid throughout the hand, especially dorsally. Findings are highly concerning for cellulitis, myofasciitis and associated tenosynovitis.  No discrete abscess is seen without contrast. 2.  The wrist is incompletely imaged.  Prominent fluid in the mid carpal compartment could reflect a reactive effusion although intra-  articular infection cannot be excluded. 3.  No evidence of osteomyelitis.  These results were called by telephone on 01/28/2012  at  1440 hours to  Dr. Wilson Singer, who verbally acknowledged these results.  Original Report Authenticated By: Vivia Ewing, M.D.   Dg Hand Complete Left  01/28/2012  *RADIOLOGY REPORT*  Clinical Data: Hand pain and swelling.  Insect bite/envenomation.  LEFT HAND -  COMPLETE 3+ VIEW  Comparison: None.  Findings: Marked soft tissue swelling is present involving the entire hand.  No destructive osseous lesions are identified. Fingers are flexed.  No radiopaque foreign body is identified. Mild enthesopathy is present involving the dorsal ulnar aspect of the proximal phalanx of the small finger.  IMPRESSION: Marked soft tissue swelling of the hand without acute osseous abnormality.  Original Report Authenticated By: Dereck Ligas, M.D.    Medications: I have reviewed the patient's current medications.  Assessment and plan: 1.Cellulitis and abscess of hand: -Patient on Vancomycin and Imipenem. S/p I & D cultures results pending. Patient spiked temperature. WBC at 15 unchange from yesterday.  2. Diabetes mellitus -BG well controlled with SSI. Patient now eating. At home he is on 70/30. Will start Lantus and SSI.  3. HTN (hypertension) -BP high continue to hold ACE. Due to renal function. Start norvasc.  4. ARF (acute renal failure) -Improving with IV fluids, continue to hold ACE.  5. Hyponatremia -Chloride is low most likely pre-renal continue IF fluids check a B-met in am.  6. Normocytic anemia -Hbg stable at 9.2. Will check an anemia panel.   7.Sinusitis  on broad spectrum antibiotics.     LOS: 1 day   Charlynne Cousins M.D. Pager: [phone] [facility] 01/29/2012, 7:53 AM

## 2012-01-30 ENCOUNTER — Encounter (HOSPITAL_COMMUNITY): Payer: Self-pay | Admitting: Anesthesiology

## 2012-01-30 ENCOUNTER — Encounter (HOSPITAL_COMMUNITY)
Admission: EM | Disposition: A | Discharge status: Home Health | Payer: Self-pay | Source: Home / Self Care | Attending: Internal Medicine

## 2012-01-30 ENCOUNTER — Inpatient Hospital Stay (HOSPITAL_COMMUNITY): Payer: Self-pay | Admitting: Anesthesiology

## 2012-01-30 HISTORY — PX: I&D EXTREMITY: SHX5045

## 2012-01-30 LAB — BASIC METABOLIC PANEL
CO2: 22 mEq/L (ref 19–32)
Calcium: 8.2 mg/dL — ABNORMAL LOW (ref 8.4–10.5)
GFR calc non Af Amer: 38 mL/min — ABNORMAL LOW (ref >90)
Glucose, Bld: 153 mg/dL — ABNORMAL HIGH (ref 70–99)
Potassium: 3.7 mEq/L (ref 3.5–5.1)
Sodium: 133 mEq/L — ABNORMAL LOW (ref 135–145)

## 2012-01-30 LAB — GLUCOSE, CAPILLARY
Glucose-Capillary: 231 mg/dL — ABNORMAL HIGH (ref 70–99)
Glucose-Capillary: 152 mg/dL — ABNORMAL HIGH (ref 70–99)
Glucose-Capillary: 177 mg/dL — ABNORMAL HIGH (ref 70–99)
Glucose-Capillary: 190 mg/dL — ABNORMAL HIGH (ref 70–99)

## 2012-01-30 LAB — CBC
MCH: 27.6 pg (ref 26.0–34.0)
MCHC: 33.2 g/dL (ref 30.0–36.0)
MCV: 83.2 fL (ref 78.0–100.0)
Platelets: 267 10*3/uL (ref 150–400)

## 2012-01-30 LAB — DIFFERENTIAL
Eosinophils Absolute: 0.5 10*3/uL (ref 0.0–0.7)
Lymphs Abs: 1.4 10*3/uL (ref 0.7–4.0)
Monocytes Absolute: 1.1 10*3/uL — ABNORMAL HIGH (ref 0.1–1.0)
Neutrophils Relative %: 81 % — ABNORMAL HIGH (ref 43–77)

## 2012-01-30 SURGERY — IRRIGATION AND DEBRIDEMENT EXTREMITY
Anesthesia: General | Site: Hand | Laterality: Left | Wound class: Dirty or Infected

## 2012-01-30 MED ORDER — HYDROMORPHONE HCL PF 1 MG/ML IJ SOLN
INTRAMUSCULAR | Status: AC
  Filled 2012-01-30: qty 1

## 2012-01-30 MED ORDER — AMLODIPINE BESYLATE 10 MG PO TABS
10.0000 mg | ORAL_TABLET | Freq: Every day | ORAL | Status: DC
  Administered 2012-01-31 – 2012-02-04 (×5): 10 mg via ORAL
  Filled 2012-01-30 (×5): qty 1

## 2012-01-30 MED ORDER — SODIUM CHLORIDE 0.9 % IV SOLN
INTRAVENOUS | Status: DC
  Administered 2012-01-31: 06:00:00 via INTRAVENOUS
  Administered 2012-02-01 – 2012-02-02 (×2): 20 mL/h via INTRAVENOUS

## 2012-01-30 MED ORDER — FENTANYL CITRATE 0.05 MG/ML IJ SOLN
25.0000 ug | INTRAMUSCULAR | Status: DC | PRN
  Administered 2012-01-30 (×3): 50 ug via INTRAVENOUS

## 2012-01-30 MED ORDER — LIDOCAINE HCL (CARDIAC) 20 MG/ML IV SOLN
INTRAVENOUS | Status: DC | PRN
  Administered 2012-01-30: 100 mg via INTRAVENOUS

## 2012-01-30 MED ORDER — LACTATED RINGERS IV SOLN
INTRAVENOUS | Status: DC | PRN
  Administered 2012-01-30 (×2): via INTRAVENOUS

## 2012-01-30 MED ORDER — PROMETHAZINE HCL 25 MG/ML IJ SOLN: 6.2500 mg | INTRAMUSCULAR | Status: DC | PRN

## 2012-01-30 MED ORDER — MIDAZOLAM HCL 5 MG/5ML IJ SOLN
INTRAMUSCULAR | Status: DC | PRN
  Administered 2012-01-30: 2 mg via INTRAVENOUS

## 2012-01-30 MED ORDER — EPHEDRINE SULFATE 50 MG/ML IJ SOLN
INTRAMUSCULAR | Status: DC | PRN
  Administered 2012-01-30: 10 mg via INTRAVENOUS

## 2012-01-30 MED ORDER — LACTATED RINGERS IV SOLN
INTRAVENOUS | Status: DC
Start: 2012-01-30 — End: 2012-01-30

## 2012-01-30 MED ORDER — SODIUM CHLORIDE 0.9 % IR SOLN
Status: DC | PRN
  Administered 2012-01-30: 6000 mL

## 2012-01-30 MED ORDER — LACTATED RINGERS IV SOLN: INTRAVENOUS | Status: DC

## 2012-01-30 MED ORDER — FENTANYL CITRATE 0.05 MG/ML IJ SOLN
INTRAMUSCULAR | Status: AC
  Filled 2012-01-30: qty 2

## 2012-01-30 MED ORDER — 0.9 % SODIUM CHLORIDE (POUR BTL) OPTIME
TOPICAL | Status: DC | PRN
  Administered 2012-01-30: 1000 mL

## 2012-01-30 MED ORDER — FENTANYL CITRATE 0.05 MG/ML IJ SOLN
INTRAMUSCULAR | Status: DC | PRN
  Administered 2012-01-30 (×2): 50 ug via INTRAVENOUS

## 2012-01-30 MED ORDER — PROPOFOL 10 MG/ML IV EMUL
INTRAVENOUS | Status: DC | PRN
  Administered 2012-01-30: 200 mg via INTRAVENOUS

## 2012-01-30 SURGICAL SUPPLY — 42 items
BAG ZIPLOCK 12X15 (MISCELLANEOUS) ×2 IMPLANT
BANDAGE ACE 4 STERILE (GAUZE/BANDAGES/DRESSINGS) ×4 IMPLANT
BANDAGE ELASTIC 4 VELCRO ST LF (GAUZE/BANDAGES/DRESSINGS) ×4 IMPLANT
BANDAGE GAUZE ELAST BULKY 4 IN (GAUZE/BANDAGES/DRESSINGS) ×2 IMPLANT
BLADE SURG SZ10 CARB STEEL (BLADE) IMPLANT
CLOTH BEACON ORANGE TIMEOUT ST (SAFETY) ×2 IMPLANT
CUFF TOURN SGL QUICK 18 (TOURNIQUET CUFF) ×2 IMPLANT
DRAIN PENROSE 18X1/2 LTX STRL (DRAIN) IMPLANT
DRAIN PENROSE 18X1/4 LTX STRL (WOUND CARE) ×4 IMPLANT
DRAPE SURG 17X11 SM STRL (DRAPES) ×2 IMPLANT
DRESSING XEROFORM 5X9 (GAUZE/BANDAGES/DRESSINGS) ×2 IMPLANT
DRSG EMULSION OIL 3X16 NADH (GAUZE/BANDAGES/DRESSINGS) ×4 IMPLANT
DRSG PAD ABDOMINAL 8X10 ST (GAUZE/BANDAGES/DRESSINGS) ×2 IMPLANT
ELECT REM PT RETURN 9FT ADLT (ELECTROSURGICAL) ×2
ELECTRODE REM PT RTRN 9FT ADLT (ELECTROSURGICAL) ×1 IMPLANT
GAUZE KERLIX 2  STERILE LF (GAUZE/BANDAGES/DRESSINGS) ×4 IMPLANT
GAUZE SPONGE 4X4 12PLY STRL LF (GAUZE/BANDAGES/DRESSINGS) ×2 IMPLANT
GAUZE XEROFORM 1X8 LF (GAUZE/BANDAGES/DRESSINGS) ×2 IMPLANT
GAUZE XEROFORM 5X9 LF (GAUZE/BANDAGES/DRESSINGS) ×2 IMPLANT
GLOVE BIO SURGEON STRL SZ8 (GLOVE) ×2 IMPLANT
GOWN STRL REIN XL XLG (GOWN DISPOSABLE) ×2 IMPLANT
KIT BASIN OR (CUSTOM PROCEDURE TRAY) ×2 IMPLANT
MANIFOLD NEPTUNE II (INSTRUMENTS) ×2 IMPLANT
PACK LOWER EXTREMITY WL (CUSTOM PROCEDURE TRAY) ×2 IMPLANT
PAD CAST 4YDX4 CTTN HI CHSV (CAST SUPPLIES) ×1 IMPLANT
PADDING CAST COTTON 4X4 STRL (CAST SUPPLIES) ×1
PADDING WEBRIL 4 STERILE (GAUZE/BANDAGES/DRESSINGS) ×4 IMPLANT
POSITIONER SURGICAL ARM (MISCELLANEOUS) ×2 IMPLANT
SET CYSTO W/LG BORE CLAMP LF (SET/KITS/TRAYS/PACK) ×2 IMPLANT
SOL PREP POV-IOD 16OZ 10% (MISCELLANEOUS) ×2 IMPLANT
SOL PREP PROV IODINE SCRUB 4OZ (MISCELLANEOUS) ×2 IMPLANT
SPLINT PLASTER CAST XFAST 5X30 (CAST SUPPLIES) ×1 IMPLANT
SPLINT PLASTER XFAST SET 5X30 (CAST SUPPLIES) ×1
SPONGE GAUZE 4X4 12PLY (GAUZE/BANDAGES/DRESSINGS) ×2 IMPLANT
SUT PROLENE 3 0 PS 2 (SUTURE) IMPLANT
SUT VIC AB 1 CT1 27 (SUTURE)
SUT VIC AB 1 CT1 27XBRD ANTBC (SUTURE) IMPLANT
SUT VIC AB 2-0 CT1 27 (SUTURE)
SUT VIC AB 2-0 CT1 27XBRD (SUTURE) IMPLANT
SYR 20CC LL (SYRINGE) ×2 IMPLANT
SYR CONTROL 10ML LL (SYRINGE) IMPLANT
TOWEL OR 17X26 10 PK STRL BLUE (TOWEL DISPOSABLE) ×2 IMPLANT

## 2012-01-30 NOTE — Progress Notes (Signed)
Subjective: Day of Surgery Procedure(s) (LRB): IRRIGATION AND DEBRIDEMENT EXTREMITY (Left) Patient reports pain as mild.   Patient notes he can move his fingers better. He states his pain is improved but certainly not gone. He notes no lower extremity pain complaints or right upper extremity pain complaints. He notes no nausea vomiting fever chills. He presents for repeat I&D today.  Objective: Vital signs in last 24 hours: Temp:  [99.5 F (37.5 C)-101.6 F (38.7 C)] 100.6 F (38.1 C) (02/03 0545) Pulse Rate:  [90-95] 90  (02/03 0545) Resp:  [18-19] 19  (02/03 0545) BP: (153-162)/(76-79) 162/79 mmHg (02/03 0545) SpO2:  [93 %-100 %] 95 % (02/03 0545)  Intake/Output from previous day: 02/02 0701 - 02/03 0700 In: 4069.6 [P.O.:1040; I.V.:3029.6] Out: 1200 [Urine:1200] Intake/Output this shift:     Basename 01/29/12 0310 01/28/12 2326 01/28/12 1110  HGB 9.2* 9.2* 10.7*    Basename 01/29/12 0310 01/28/12 2326  WBC 15.3* 15.4*  RBC 3.39* 3.34*  HCT 28.1* 27.8*  PLT 235 237    Basename 01/30/12 0400 01/29/12 0310  NA 133* 128*  K 3.7 3.6  CL 102 95*  CO2 22 22  BUN 26* 27*  CREATININE 1.84* 1.98*  GLUCOSE 153* 181*  CALCIUM 8.2* 8.3*   No results found for this basename: LABPT:2,INR:2 in the last 72 hours Physical exam Compartment soft. patient is improved range of motion to the fingers. There is no signs of compartment syndrome at present time. There is no signs of dystrophy. He has much improved range of motion about the index thumb ring and small finger. His middle finger is a little bit stiff but improved from his preoperative status. He has saline rest which is still present. His arm is still major concern in terms of the infection.  Abdomen is nontender chest is clear lower TIMI examination is benign without signs of infection dystrophy or neurovascular compromise.  Assessment/Plan: Day of Surgery Procedure(s) (LRB): IRRIGATION AND DEBRIDEMENT EXTREMITY  (Left) Plan:  We are planning surgery for your upper extremity. The risk and benefits of surgery include risk of bleeding infection anesthesia damage to normal structures and failure of the surgery to accomplish its intended goals of relieving symptoms and restoring function with this in mind we'll going to proceed. I have specifically discussed with the patient the pre-and postoperative regime and the does and don'ts and risk and benefits in great detail. Risk and benefits of surgery also include risk of dystrophy chronic nerve pain failure of the healing process to go onto completion and other inherent risks of surgery The relavent the pathophysiology of the disease/injury process, as well as the alternatives for treatment and postoperative course of action has been discussed in great detail with the patient who desires to proceed.  We will do everything in our power to help you (the patient) restore function to the upper extremity. Is a pleasure to see this patient today. Will obtain labs tomorrow including CBC. We will continue to await his cultures. We'll continue IV antibiotics while awaiting cultures. Patient's family understands the plan at length. Jenisis Harmsen III,Shanon Becvar M 01/30/2012, 8:04 AM

## 2012-01-30 NOTE — Preoperative (Signed)
Beta Blockers   Reason not to administer Beta Blockers:Not Applicable 

## 2012-01-30 NOTE — Transfer of Care (Signed)
Immediate Anesthesia Transfer of Care Note  Patient: [name]  Procedure(s) Performed:  IRRIGATION AND DEBRIDEMENT EXTREMITY - flexor teno synovectomy and extensor tenosynovectomy arthrosynovectomy wristjoint  Patient Location: PACU  Anesthesia Type: General  Level of Consciousness: awake and oriented  Airway & Oxygen Therapy: Patient Spontanous Breathing and Patient connected to face mask oxygen  Post-op Assessment: Report given to PACU RN and Post -op Vital signs reviewed and stable  Post vital signs: Reviewed and stable  Complications: No apparent anesthesia complications

## 2012-01-30 NOTE — Anesthesia Postprocedure Evaluation (Signed)
  Anesthesia Post-op Note  Patient: [name]  Procedure(s) Performed:  IRRIGATION AND DEBRIDEMENT EXTREMITY - flexor teno synovectomy and extensor tenosynovectomy arthrosynovectomy wristjoint  Patient Location: PACU  Anesthesia Type: General  Level of Consciousness: awake and alert   Airway and Oxygen Therapy: Patient Spontanous Breathing  Post-op Pain: mild  Post-op Assessment: Post-op Vital signs reviewed, Patient's Cardiovascular Status Stable, Respiratory Function Stable, Patent Airway and No signs of Nausea or vomiting  Post-op Vital Signs: stable  Complications: No apparent anesthesia complications

## 2012-01-30 NOTE — Progress Notes (Signed)
Occupational Therapy Treatment Patient Details Name: RONIEL MAYTON MRN: [id number] DOB: [birth year] Today's Date: 01/30/2012  OT Assessment/Plan OT Assessment/Plan Comments on Treatment Session: Pt. underwent second I&D this a.m.  PROM and AROM composite and IP blocking performed.  PIP active extension is sluggish.  Pt. instructed in HEP.   OT Plan: Discharge plan remains appropriate OT Frequency: Min 3X/week Follow Up Recommendations: Outpatient OT Equipment Recommended: None recommended by OT OT Goals    OT Treatment Precautions/Restrictions  Restrictions Weight Bearing Restrictions: Yes LUE Weight Bearing: Non weight bearing Other Position/Activity Restrictions: keep elevated above heart on pillows or mission sling.    ADL   Mobility    Exercises General Exercises - Upper Extremity Digit Composite Flexion: AROM;Left;10 reps;Other (comment) Composite Extension: AROM;Left;10 reps;Other (comment) Other Exercises Other Exercises: Performed PROM of hand, all joints within confines of splint.  Pt. with full PROM, but stiffness noted PIP digits 2,3,5.   Pt. instructed to perform PROM hourly, including IP blocking.  Pt. performed 10 - 15 reps Composit flexion and ext within splint, Pt. with difficulty achieiving full PIP extension all digits.  Pt. performed 10 reps IP blocking all joints, all digits - pt. with ~75% PIP extension Digits 2,3, and 5.  Pt. instructed to perform every 1-2 hours.  Need for elevation of Lt. UE reinforced, and pt. placed in mission sling.  End of Session OT - End of Session Equipment Utilized During Treatment: Other (comment) (mission sling) Activity Tolerance: Patient tolerated treatment well Patient left: in bed;with call bell in reach Nurse Communication: Other (comment) (Need for Lt. UE elevation) General Behavior During Session: [facility] for tasks performed Cognition: [facility] for tasks performed  Braelyn Jenson M  01/30/2012, 1:40 PM

## 2012-01-30 NOTE — Brief Op Note (Signed)
01/28/2012 - 01/30/2012  9:07 AM  PATIENT:  [name]  61 y.o. male  PRE-OPERATIVE DIAGNOSIS:  infected left hand  POST-OPERATIVE DIAGNOSIS:  infected left hand  PROCEDURE:  Procedure(s): #1 Irrigation and debridement dorsal left hand deep infection #2 radical tenosynovectomy  extensor apparatus left hand and wrist #3 arthrotomy/ synovectomy and  Irrigation/debridement radiocarpal midcarpal and CMC joints left wrist #4 irrigation and debridement mid palmar space left hand secondary to deep abscess #5 radical tenosynovectomy flexor apparatus wrist and forearm #6 manipulation under anesthesia index middle ring and small finger PIP and MCP joint as well as DIP joint   SURGEON:  Surgeon(s): Paulene Floor, MD  PHYSICIAN ASSISTANT:   ASSISTANTS: none   ANESTHESIA:   general  EBL:  Total I/O In: 1000 [I.V.:1000] Out: 50 [Blood:50]  BLOOD ADMINISTERED:none  DRAINS: Penrose drain in the Arm wrist and hand   LOCAL MEDICATIONS USED:  NONE  SPECIMEN:  No Specimen  DISPOSITION OF SPECIMEN:  N/A  COUNTS:  YES  TOURNIQUET:  * Missing tourniquet times found for documented tourniquets in log:  [id number] *  DICTATIONDP:[id number]  PLAN OF CARE: Admit to inpatient   PATIENT DISPOSITION:  PACU - hemodynamically stable.   Delay start of Pharmacological VTE agent (>24hrs) due to surgical blood loss or risk of bleeding:  {YES/NO/NOT APPLICABLE:20182

## 2012-01-30 NOTE — Anesthesia Procedure Notes (Signed)
Procedure Name: LMA Insertion Performed by: Tinsley Everman L. Patient Re-evaluated:Patient Re-evaluated prior to inductionOxygen Delivery Method: Circle System Utilized Preoxygenation: Pre-oxygenation with 100% oxygen Intubation Type: IV induction Ventilation: Mask ventilation without difficulty LMA: LMA with gastric port inserted LMA Size: 5.0 Number of attempts: 1 Placement Confirmation: breath sounds checked- equal and bilateral and positive ETCO2 Tube secured with: Tape Dental Injury: Teeth and Oropharynx as per pre-operative assessment

## 2012-01-30 NOTE — Progress Notes (Signed)
Subjective:  -Pain controlled.  -s/p debriedment 2.03.2013  Antibiotics: Vanc: 2.1.2013>> Imipenem; 2.1.2013>> Objective: Filed Vitals:   01/30/12 0930 01/30/12 1000 01/30/12 1019 01/30/12 1117  BP:   157/75 163/75  Pulse:    93  Temp: 99.6 F (37.6 C) 99.6 F (37.6 C) 100 F (37.8 C) 99.3 F (37.4 C)  TempSrc:    Oral  Resp:   15 16  Height:      Weight:      SpO2:   100% 98%   Weight change:   Intake/Output Summary (Last 24 hours) at 01/30/12 1233 Last data filed at 01/30/12 1041  Gross per 24 hour  Intake 4379.59 ml  Output    800 ml  Net 3579.59 ml    General: Alert, awake, oriented x3, in no acute distress.  HEENT: No bruits, no goiter.  Heart: Regular rate and rhythm, without murmurs, rubs, gallops.  Lungs: good air movement CTA B/L. Abdomen: Soft, nontender, nondistended, positive bowel sounds.  Neuro: able to move finger of left hand. But there is still pain with passive movement. Good vascular filling.   Lab Results:  Basename 01/30/12 0400 01/29/12 0310 01/28/12 2326 01/28/12 1110  NA 133* 128* 129* 128*  K 3.7 3.6 3.6 3.5  CL 102 95* 96 92*  CO2 22 22 21 25   GLUCOSE 153* 181* 158* 177*  BUN 26* 27* 28* 29*  CREATININE 1.84* 1.98* 2.04* 2.03*  CALCIUM 8.2* 8.3* 8.5 9.2  MG -- -- -- --  PHOS -- -- -- --    Basename 01/29/12 0310 01/28/12 2326  AST 100* 73*  ALT 79* 66*  ALKPHOS 95 76  BILITOT 0.3 0.3  PROT 6.9 7.0  ALBUMIN 2.0* 2.0*    Basename 01/30/12 0956 01/29/12 0310 01/28/12 2326  WBC 16.1* 15.3* --  NEUTROABS 13.1* -- 12.6*  HGB 8.9* 9.2* --  HCT 26.8* 28.1* --  MCV 83.2 82.9 --  PLT 267 235 --    Basename 01/28/12 1110  HGBA1C 7.4*   Micro Results: Recent Results (from the past 240 hour(s))  CULTURE, BLOOD (ROUTINE X 2)     Status: Normal (Preliminary result)   Collection Time   01/28/12 11:10 AM      Component Value Range Status Comment   Specimen Description BLOOD RIGHT FOREARM   Final    Special Requests BOTTLES  DRAWN AEROBIC AND ANAEROBIC 5CC EACH   Final    Culture  Setup Time [id number]   Final    Culture     Final    Value:        BLOOD CULTURE RECEIVED NO GROWTH TO DATE CULTURE WILL BE HELD FOR 5 DAYS BEFORE ISSUING A FINAL NEGATIVE REPORT   Report Status PENDING   Incomplete   CULTURE, BLOOD (ROUTINE X 2)     Status: Normal (Preliminary result)   Collection Time   01/28/12 11:45 AM      Component Value Range Status Comment   Specimen Description BLOOD RIGHT HAND   Final    Special Requests BOTTLES DRAWN AEROBIC AND ANAEROBIC 5CC   Final    Culture  Setup Time [id number]   Final    Culture     Final    Value:        BLOOD CULTURE RECEIVED NO GROWTH TO DATE CULTURE WILL BE HELD FOR 5 DAYS BEFORE ISSUING A FINAL NEGATIVE REPORT   Report Status PENDING   Incomplete   CULTURE, ROUTINE-ABSCESS     Status: Normal (  Preliminary result)   Collection Time   01/28/12  7:16 PM      Component Value Range Status Comment   Specimen Description HAND   Final    Special Requests NONE   Final    Gram Stain PENDING   Incomplete    Culture NO GROWTH 1 DAY   Final    Report Status PENDING   Incomplete   ANAEROBIC CULTURE     Status: Normal (Preliminary result)   Collection Time   01/28/12  7:16 PM      Component Value Range Status Comment   Specimen Description HAND   Final    Special Requests NONE   Final    Gram Stain PENDING   Incomplete    Culture     Final    Value: NO ANAEROBES ISOLATED; CULTURE IN PROGRESS FOR 5 DAYS   Report Status PENDING   Incomplete   FUNGUS CULTURE W SMEAR     Status: Normal (Preliminary result)   Collection Time   01/28/12  7:16 PM      Component Value Range Status Comment   Specimen Description HAND   Final    Special Requests NONE   Final    Fungal Smear NO YEAST OR FUNGAL ELEMENTS SEEN   Final    Culture CULTURE IN PROGRESS FOR FOUR WEEKS   Final    Report Status PENDING   Incomplete   CULTURE, ROUTINE-ABSCESS     Status: Normal (Preliminary result)   Collection Time     01/28/12  7:20 PM      Component Value Range Status Comment   Specimen Description HAND   Final    Special Requests NONE   Final    Gram Stain     Final    Value: RARE WBC PRESENT,BOTH PMN AND MONONUCLEAR     NO SQUAMOUS EPITHELIAL CELLS SEEN     NO ORGANISMS SEEN   Culture NO GROWTH 1 DAY   Final    Report Status PENDING   Incomplete   ANAEROBIC CULTURE     Status: Normal (Preliminary result)   Collection Time   01/28/12  7:20 PM      Component Value Range Status Comment   Specimen Description HAND   Final    Special Requests NONE   Final    Gram Stain     Final    Value: RARE WBC PRESENT,BOTH PMN AND MONONUCLEAR     NO SQUAMOUS EPITHELIAL CELLS SEEN     NO ORGANISMS SEEN   Culture     Final    Value: NO ANAEROBES ISOLATED; CULTURE IN PROGRESS FOR 5 DAYS   Report Status PENDING   Incomplete   FUNGUS CULTURE W SMEAR     Status: Normal (Preliminary result)   Collection Time   01/28/12  7:20 PM      Component Value Range Status Comment   Specimen Description HAND   Final    Special Requests NONE   Final    Fungal Smear NO YEAST OR FUNGAL ELEMENTS SEEN   Final    Culture CULTURE IN PROGRESS FOR FOUR WEEKS   Final    Report Status PENDING   Incomplete   CULTURE, ROUTINE-ABSCESS     Status: Normal (Preliminary result)   Collection Time   01/28/12  7:22 PM      Component Value Range Status Comment   Specimen Description WRIST   Final    Special Requests NONE   Final  Gram Stain PENDING   Incomplete    Culture NO GROWTH 1 DAY   Final    Report Status PENDING   Incomplete   ANAEROBIC CULTURE     Status: Normal (Preliminary result)   Collection Time   01/28/12  7:22 PM      Component Value Range Status Comment   Specimen Description WRIST   Final    Special Requests NONE   Final    Gram Stain PENDING   Incomplete    Culture     Final    Value: NO ANAEROBES ISOLATED; CULTURE IN PROGRESS FOR 5 DAYS   Report Status PENDING   Incomplete   FUNGUS CULTURE W SMEAR     Status: Normal  (Preliminary result)   Collection Time   01/28/12  7:22 PM      Component Value Range Status Comment   Specimen Description WRIST   Final    Special Requests NONE   Final    Fungal Smear NO YEAST OR FUNGAL ELEMENTS SEEN   Final    Culture CULTURE IN PROGRESS FOR FOUR WEEKS   Final    Report Status PENDING   Incomplete     Studies/Results: Dg Eye Foreign Body  01/28/2012  *RADIOLOGY REPORT*  Clinical Data: Metal working/exposure; clearance prior to MRI  ORBITS FOR FOREIGN BODY - 2 VIEW  Comparison:  None.  Findings:  There is no evidence of metallic foreign body within the orbits.  No significant bone abnormality identified.  Opacification of the left maxillary sinus and bilateral ethmoid sinuses is seen, consistent with sinusitis.  IMPRESSION: 1.  No evidence of metallic foreign body within the orbits. 2.  Left maxillary and bilateral ethmoid sinus disease incidentally noted.  Original Report Authenticated By: Marlaine Hind, M.D.   Dg Chest 2 View  01/28/2012  *RADIOLOGY REPORT*  Clinical Data: Preoperative exam for left arm surgery.  Diabetes and hypertension.  CHEST - 2 VIEW  Comparison: 08/12/2010  Findings: The heart size is stable and at the upper limits of normal.  Both lungs are clear.  No evidence of pleural effusion. No mass or lymphadenopathy identified.  Mild thoracic dextroscoliosis remains stable.  IMPRESSION: No active cardiopulmonary disease.  Original Report Authenticated By: Marlaine Hind, M.D.   [name]  01/28/2012  *RADIOLOGY REPORT*  Clinical Data: Diffuse hand pain, swelling and erythema.  Possible insect bite 5 days ago.  Question infection.  MRI OF THE LEFT HAND WITHOUT CONTRAST  Technique:  Multiplanar, multisequence MR imaging was performed. No intravenous contrast was administered.  Comparison: Radiographs 01/28/2012.  Findings: There is severe diffuse soft tissue swelling and edema throughout the hand.  Ill-defined fluid is present dorsally, measuring up to  1.8 cm in thickness.  This primarily lies superficial to the extensor tendons.  Proximal extent at the wrist is not imaged.  Distally, this extends into the proximal digits. Contrast was not administered, limiting assessment for focal fluid collections.  There is moderate fluid within the flexor and extensor tendon sheaths.  The wrist is incompletely imaged.  There is prominent fluid within the midcarpal compartment, deep to the extensor tendons. There is diffuse edema throughout the intrinsic hand muscles.  There is no evidence of bone destruction within the metacarpals, phalanges or distal carpal row.  All of the fingers are flexed, somewhat limiting distal phalangeal assessment.  IMPRESSION:  1.  Markedly abnormal study with extensive soft tissue edema and ill-defined fluid throughout the hand, especially dorsally.  Findings are highly concerning for cellulitis, myofasciitis and associated tenosynovitis.  No discrete abscess is seen without contrast. 2.  The wrist is incompletely imaged.  Prominent fluid in the mid carpal compartment could reflect a reactive effusion although intra- articular infection cannot be excluded. 3.  No evidence of osteomyelitis.  These results were called by telephone on 01/28/2012  at  1440 hours to  Dr. Wilson Singer, who verbally acknowledged these results.  Original Report Authenticated By: Vivia Ewing, M.D.    Medications: I have reviewed the patient's current medications.  Assessment and plan: 1.Cellulitis and abscess of hand: -Patient on Vancomycin and Imipenem. S/p I & D x 2 (2.02.2013 and 2.03.2013)cultures results pending. No further fevers.  2. Diabetes mellitus -BG well controlled with SSI. Patient now eating. At home he is on 70/30. Will start Lantus and SSI.  3. HTN (hypertension) -BP high continue to hold ACE. Due to renal function. Start norvasc.  4. ARF (acute renal failure) -Improving with IV fluids, continue to hold ACE.  5.  Hyponatremia -resolved.  6. Normocytic anemia -Hbg stable at 9.2. Will check an anemia panel.   7.Sinusitis  on broad spectrum antibiotics.     LOS: 2 days   Charlynne Cousins M.D. Pager: [phone] [facility] 01/30/2012, 12:33 PM

## 2012-01-31 LAB — GLUCOSE, CAPILLARY
Glucose-Capillary: 155 mg/dL — ABNORMAL HIGH (ref 70–99)
Glucose-Capillary: 149 mg/dL — ABNORMAL HIGH (ref 70–99)

## 2012-01-31 LAB — COMPREHENSIVE METABOLIC PANEL
Albumin: 1.9 g/dL — ABNORMAL LOW (ref 3.5–5.2)
Alkaline Phosphatase: 92 U/L (ref 39–117)
BUN: 20 mg/dL (ref 6–23)
Chloride: 101 mEq/L (ref 96–112)
Creatinine, Ser: 1.72 mg/dL — ABNORMAL HIGH (ref 0.50–1.35)
GFR calc Af Amer: 48 mL/min — ABNORMAL LOW (ref >90)
Glucose, Bld: 147 mg/dL — ABNORMAL HIGH (ref 70–99)
Potassium: 3.7 mEq/L (ref 3.5–5.1)
Total Bilirubin: 0.3 mg/dL (ref 0.3–1.2)
Total Protein: 7 g/dL (ref 6.0–8.3)

## 2012-01-31 MED ORDER — FERROUS SULFATE 325 (65 FE) MG PO TABS
325.0000 mg | ORAL_TABLET | Freq: Three times a day (TID) | ORAL | Status: DC
  Administered 2012-01-31 – 2012-02-04 (×9): 325 mg via ORAL
  Filled 2012-01-31 (×14): qty 1

## 2012-01-31 MED ORDER — SODIUM CHLORIDE 0.9 % IV SOLN
500.0000 mg | Freq: Three times a day (TID) | INTRAVENOUS | Status: DC
  Administered 2012-01-31 – 2012-02-04 (×12): 500 mg via INTRAVENOUS
  Filled 2012-01-31 (×16): qty 500

## 2012-01-31 NOTE — Progress Notes (Signed)
Patient ID: [name], male   DOB: Jul 12, 1951, 61 y.o.   MRN: [id number]  Subjective: Feeling better today, pain at wrist and hand is significantly improved, denies fevers, chills, nausea, vomiting.   Objective: Vital signs in last 24 hours: Temp:  [98.4 F (36.9 C)-100.9 F (38.3 C)] 100 F (37.8 C) (02/04 0525) Pulse Rate:  [87-97] 89  (02/04 0525) Resp:  [17-19] 19  (02/04 0525) BP: (155-167)/(72-90) 165/90 mmHg (02/04 0525) SpO2:  [93 %-100 %] 99 % (02/04 0525)  Intake/Output from previous day: 02/03 0701 - 02/04 0700 In: 3368 [P.O.:1200; I.V.:2168] Out: 900 [Urine:850; Blood:50] Intake/Output this shift: Total I/O In: 240 [P.O.:240] Out: 225 [Urine:225]   Basename 01/30/12 0956 01/29/12 0310 01/28/12 2326  HGB 8.9* 9.2* 9.2*    Basename 01/30/12 0956 01/29/12 0310  WBC 16.1* 15.3*  RBC 3.22* 3.39*  HCT 26.8* 28.1*  PLT 267 235    Basename 01/31/12 0325 01/30/12 0400  NA 131* 133*  K 3.7 3.7  CL 101 102  CO2 21 22  BUN 20 26*  CREATININE 1.72* 1.84*  GLUCOSE 147* 153*  CALCIUM 8.3* 8.2*   No results found for this basename: LABPT:2,INR:2 in the last 72 hours  A & O x 3 HEENT; atraumatic, normocephalic Chest: equal expansions present Abdomen; Nontender LUE: Splint clean and dry, digital swelling improved, ROM improved  Assessment/Plan: S/P I and D Left hand infection Diabetes mellitus  Cellulitis and abscess of hand  HTN (hypertension)  ARF (acute renal failure)  Hyponatremia  Normocytic anemia  Sinusitis   Cultures are still pending, the wound intraoperatively appeared better, we will plan for repeat I & D tomorrow and attempts at wound closure. Continue aggressive attempts at finger rom.   Long Brimage L 01/31/2012, 1:21 PM

## 2012-01-31 NOTE — Op Note (Signed)
[name], MIKAN NO.:  [id number]  MEDICAL RECORD NO.:  [id number]  LOCATION:  47                         FACILITY:  [facility]  PHYSICIAN:  Satira Anis. Versie Fleener, M.D.DATE OF BIRTH:  [birth year]  DATE OF PROCEDURE: DATE OF DISCHARGE:  10/18/2011                              OPERATIVE REPORT   PREOPERATIVE DIAGNOSES:  Abscess of left wrist, radiocarpal, midcarpal, and carpometacarpal joints with associated  purulent flexor and extensor tenosynovitis, mid palmar space infection, and dorsal hand abscess.  POSTOPERATIVE DIAGNOSES:  Abscess of left wrist, radiocarpal, midcarpal, and carpometacarpal joints with associated purulent flexor and extensor tenosynovitis, mid palmar space infection, and dorsal hand abscess.  PROCEDURES: 1. Irrigation and debridement, left hand dorsal abscess.  This was an     irrigation and debridement of excisional nature, skin and     subcutaneous tissue, tendon, and muscle. 2. Radical tenosynovectomy, extensor apparatus, left hand 2nd through     5th compartments. 3. Arthrotomy, synovectomy, and irrigation and debridement of     radiocarpal, midcarpal, and carpometacarpal joints, left wrist. 4. Irrigation and debridement, mid palmar space, left hand, secondary     to deep abscess. 5. Radical tenosynovectomy, flexor apparatus, wrist and forearm. 6. Manipulation under anesthesia, proximal interphalangeal and     metacarpophalangeal joints; index, middle, ring, and small fingers.  SURGEON:  Satira Anis. Amedeo Plenty, M.D.  ASSISTANTS:  None.  COMPLICATIONS:  None.  ANESTHESIA:  General.  TOURNIQUET TIME:  0.  INDICATIONS FOR THE PROCEDURE:  This is a 61 year old male with tremendous infection.  He has a history of diabetes and what I feel is some degree of immunocompromise to have such a horrid infection.  I have discussed with him the findings.  He has improved since his initial I and D on Friday night.  He presents Sunday morning for  repeat evaluation, debridement etc.  He understands the risks and benefits, and desires to proceed.  OPERATIVE PROCEDURE:  The patient was seen by myself and Anesthesia. Time-out was called, he was taken to the operative suite, underwent a smooth induction of general anesthesia, laid supine, appropriately padded, prepped and draped in a sterile fashion.  Betadine scrub and paint about the left upper extremity.  Body parts were well padded, final time-out called.  Pre and postop check was complete and the patient then underwent irrigation and debridement of skin, subcutaneous tissue, muscle and associated soft tissue about the dorsal hand, where a deep abscess was noted.  The characteristics were improved.  This was an excisional debridement with curette, scissor, and knife blade.  Following this, I then performed a radical tenosynovectomy of the extensor apparatus of the left hand and wrist.  He had significant re- accumulation of tenosynovium.  There is no frank thick pus, but certainly the tenosynovium once again had to be meticulously addressed with a radical tenosynovectomy.  This was done with combination of scissor tip, curette, and associated orthopedic instrument including scissors.  Following this, I then performed arthrotomy, synovectomy of the midcarpal, radiocarpal, and CMC joints.  The patient tolerated this well.  There were no complicating features.  Once this was done, I then placed 3 L of irrigation to the  dorsal apparatus, which was about the hand abscess, tendons, and wrist joint.  Penrose drains were placed through the multiple wounds on the dorsum of the hand and wrist of course.  The patient had multiple incisions of course.  Following this, attention was turned towards the flexor apparatus. Radical tenosynovectomy of the flexor apparatus was accomplished.  FDP, FDS, FPL, and tenosynovium were dressed with the tenolysis, tenosynovectomy.  The median nerve was  identified, carefully protected at all times.  The patient had no excessive reaccumulation of purulence.  He had much improved characteristics, but still had some degree of thickened tenosynovium, which had to be excised.  Following this, I then performed irrigation and debridement of the mid palmar space.  This included the skin, subcutaneous tissue, muscle, and tendon sheath tissue.  Following this, 3 L of saline were placed through the wounds with 2 Penrose drains placed through multiple incisions about the palm, forearm, and wrist.  Once this was done, I then checked refill, it looked excellent.  Following this, I then performed placement of a dressing.  Neosporin, Adaptic, nonadherent gauze, Xeroform, 4 x 4 gauze, Webril, Kerlix, and a volar fiberglass splint were applied.  He tolerated the procedure well at the soft compartments, I did perform a manipulation under anesthesia of the index, middle, ring, and small finger, PIP and MCP joints during the course of the operation, there were no complicating features.  He had some stiffness and certainly we wanted to prevent periarticular arthrofibrosis, that is the reason for manipulation under anesthesia.  Hopefully we will continue to make gains.  We will await cultures.  Plan for continued IV antibiotics and general postoperative observatory care.  The patient and his family understands all issues and all questions have been encouraged and answered.     Satira Anis. Amedeo Plenty, M.D.     [facility]  D:  01/30/2012  T:  01/31/2012  Job:  [id number]

## 2012-01-31 NOTE — Progress Notes (Signed)
Subjective:  -Pain controlled.  -s/p debriedment 2.03.2013 -On going fevers.  Antibiotics: Vanc: 2.1.2013>> Imipenem: 2.1.2013>>  CUltures: -Anaerobic 2.01.2013:  Negative till date. -Fungal culture 2.01.2013: Negative till date. -Routine culture 2.01.2013: Negative till date. Objective: Filed Vitals:   01/30/12 2005 01/30/12 2225 01/31/12 0200 01/31/12 0525  BP: 167/85   165/90  Pulse: 97   89  Temp: 98.8 F (37.1 C) 100.9 F (38.3 C) 98.4 F (36.9 C) 100 F (37.8 C)  TempSrc: Oral   Oral  Resp: 18   19  Height:      Weight:      SpO2: 93%   99%   Weight change:   Intake/Output Summary (Last 24 hours) at 01/31/12 0958 Last data filed at 01/31/12 0525  Gross per 24 hour  Intake   1200 ml  Output    850 ml  Net    350 ml    General: Alert, awake, oriented x3, in no acute distress.  HEENT: No bruits, no goiter.  Heart: Regular rate and rhythm, without murmurs, rubs, gallops.  Lungs: good air movement CTA B/L. Abdomen: Soft, nontender, nondistended, positive bowel sounds.  Neuro: able to move finger of left hand. But there is still pain with passive movement. Good vascular filling. Ext: hand dress and elevated.  Lab Results:  Basename 01/31/12 0325 01/30/12 0400 01/29/12 0310 01/28/12 2326 01/28/12 1110  NA 131* 133* 128* 129* 128*  K 3.7 3.7 3.6 3.6 3.5  CL 101 102 95* 96 92*  CO2 21 22 22 21 25   GLUCOSE 147* 153* 181* 158* 177*  BUN 20 26* 27* 28* 29*  CREATININE 1.72* 1.84* 1.98* 2.04* 2.03*  CALCIUM 8.3* 8.2* 8.3* 8.5 9.2  MG -- -- -- -- --  PHOS -- -- -- -- --    Basename 01/31/12 0325 01/29/12 0310  AST 103* 100*  ALT 119* 79*  ALKPHOS 92 95  BILITOT 0.3 0.3  PROT 7.0 6.9  ALBUMIN 1.9* 2.0*    Basename 01/30/12 0956 01/29/12 0310 01/28/12 2326  WBC 16.1* 15.3* --  NEUTROABS 13.1* -- 12.6*  HGB 8.9* 9.2* --  HCT 26.8* 28.1* --  MCV 83.2 82.9 --  PLT 267 235 --    Basename 01/28/12 1110  HGBA1C 7.4*   Micro Results: Recent Results  (from the past 240 hour(s))  CULTURE, BLOOD (ROUTINE X 2)     Status: Normal (Preliminary result)   Collection Time   01/28/12 11:10 AM      Component Value Range Status Comment   Specimen Description BLOOD RIGHT FOREARM   Final    Special Requests BOTTLES DRAWN AEROBIC AND ANAEROBIC 5CC EACH   Final    Culture  Setup Time [id number]   Final    Culture     Final    Value:        BLOOD CULTURE RECEIVED NO GROWTH TO DATE CULTURE WILL BE HELD FOR 5 DAYS BEFORE ISSUING A FINAL NEGATIVE REPORT   Report Status PENDING   Incomplete   CULTURE, BLOOD (ROUTINE X 2)     Status: Normal (Preliminary result)   Collection Time   01/28/12 11:45 AM      Component Value Range Status Comment   Specimen Description BLOOD RIGHT HAND   Final    Special Requests BOTTLES DRAWN AEROBIC AND ANAEROBIC [facility]   Final    Culture  Setup Time [id number]   Final    Culture     Final    Value:  BLOOD CULTURE RECEIVED NO GROWTH TO DATE CULTURE WILL BE HELD FOR 5 DAYS BEFORE ISSUING A FINAL NEGATIVE REPORT   Report Status PENDING   Incomplete   CULTURE, ROUTINE-ABSCESS     Status: Normal (Preliminary result)   Collection Time   01/28/12  7:16 PM      Component Value Range Status Comment   Specimen Description HAND   Final    Special Requests NONE   Final    Gram Stain PENDING   Incomplete    Culture NO GROWTH 2 DAYS   Final    Report Status PENDING   Incomplete   ANAEROBIC CULTURE     Status: Normal (Preliminary result)   Collection Time   01/28/12  7:16 PM      Component Value Range Status Comment   Specimen Description HAND   Final    Special Requests NONE   Final    Gram Stain PENDING   Incomplete    Culture     Final    Value: NO ANAEROBES ISOLATED; CULTURE IN PROGRESS FOR 5 DAYS   Report Status PENDING   Incomplete   FUNGUS CULTURE W SMEAR     Status: Normal (Preliminary result)   Collection Time   01/28/12  7:16 PM      Component Value Range Status Comment   Specimen Description HAND   Final    Special  Requests NONE   Final    Fungal Smear NO YEAST OR FUNGAL ELEMENTS SEEN   Final    Culture CULTURE IN PROGRESS FOR FOUR WEEKS   Final    Report Status PENDING   Incomplete   CULTURE, ROUTINE-ABSCESS     Status: Normal (Preliminary result)   Collection Time   01/28/12  7:20 PM      Component Value Range Status Comment   Specimen Description HAND   Final    Special Requests NONE   Final    Gram Stain     Final    Value: RARE WBC PRESENT,BOTH PMN AND MONONUCLEAR     NO SQUAMOUS EPITHELIAL CELLS SEEN     NO ORGANISMS SEEN   Culture NO GROWTH 2 DAYS   Final    Report Status PENDING   Incomplete   ANAEROBIC CULTURE     Status: Normal (Preliminary result)   Collection Time   01/28/12  7:20 PM      Component Value Range Status Comment   Specimen Description HAND   Final    Special Requests NONE   Final    Gram Stain     Final    Value: RARE WBC PRESENT,BOTH PMN AND MONONUCLEAR     NO SQUAMOUS EPITHELIAL CELLS SEEN     NO ORGANISMS SEEN   Culture     Final    Value: NO ANAEROBES ISOLATED; CULTURE IN PROGRESS FOR 5 DAYS   Report Status PENDING   Incomplete   FUNGUS CULTURE W SMEAR     Status: Normal (Preliminary result)   Collection Time   01/28/12  7:20 PM      Component Value Range Status Comment   Specimen Description HAND   Final    Special Requests NONE   Final    Fungal Smear NO YEAST OR FUNGAL ELEMENTS SEEN   Final    Culture CULTURE IN PROGRESS FOR FOUR WEEKS   Final    Report Status PENDING   Incomplete   CULTURE, ROUTINE-ABSCESS     Status: Normal (Preliminary result)  Collection Time   01/28/12  7:22 PM      Component Value Range Status Comment   Specimen Description WRIST   Final    Special Requests NONE   Final    Gram Stain PENDING   Incomplete    Culture NO GROWTH 2 DAYS   Final    Report Status PENDING   Incomplete   ANAEROBIC CULTURE     Status: Normal (Preliminary result)   Collection Time   01/28/12  7:22 PM      Component Value Range Status Comment   Specimen  Description WRIST   Final    Special Requests NONE   Final    Gram Stain PENDING   Incomplete    Culture     Final    Value: NO ANAEROBES ISOLATED; CULTURE IN PROGRESS FOR 5 DAYS   Report Status PENDING   Incomplete   FUNGUS CULTURE W SMEAR     Status: Normal (Preliminary result)   Collection Time   01/28/12  7:22 PM      Component Value Range Status Comment   Specimen Description WRIST   Final    Special Requests NONE   Final    Fungal Smear NO YEAST OR FUNGAL ELEMENTS SEEN   Final    Culture CULTURE IN PROGRESS FOR FOUR WEEKS   Final    Report Status PENDING   Incomplete     Studies/Results: No results found.  Medications: I have reviewed the patient's current medications.  Assessment and plan: 1.Cellulitis and abscess of hand: -Patient on Vancomycin and Imipenem. S/p I & D x 2 (2.02.2013 and 2.03.2013)cultures results pending. Mild temperature overnight. But overall fever curve trending down continue current fever. CBC in am.  2. Diabetes mellitus -BG well controlled with SSI. Patient now eating.   3. HTN (hypertension) -BP high continue to hold ACE. Due to renal function. Start norvasc.  4. ARF (acute renal failure) -Improving with IV fluids, continue to hold ACE.  5. Hyponatremia -resolved.  6. Normocytic anemia -Hbg stable at 9.2.  -iron <10, ferritin high as expected. Start Iron sulfate will need evaluation as an outpatient.   7.Sinusitis  on broad spectrum antibiotics.     LOS: 3 days   Charlynne Cousins M.D. Pager: [phone] [facility] 01/31/2012, 9:58 AM

## 2012-01-31 NOTE — Progress Notes (Signed)
ANTIBIOTIC CONSULT NOTE - follow up  Pharmacy Consult for Primaxin & Vancomycin Indication: Left hand cellulitis with abscess  Allergies  Allergen Reactions  . Penicillins Hives and Swelling   Patient Measurements: Height: 6\' 2"  (188 cm) Weight: 225 lb (102.059 kg) IBW/kg (Calculated) : 82.2   Vital Signs: Temp: 100 F (37.8 C) (02/04 0525) Temp src: Oral (02/04 0525) BP: 165/90 mmHg (02/04 0525) Pulse Rate: 89  (02/04 0525) Intake/Output from previous day: 02/03 0701 - 02/04 0700 In: 3368 [P.O.:1200; I.V.:2168] Out: 900 [Urine:850; Blood:50] Intake/Output from this shift: Total I/O In: 240 [P.O.:240] Out: -   Labs:  Basename 01/31/12 0325 01/30/12 0956 01/30/12 0400 01/29/12 0310 01/28/12 2326  WBC -- 16.1* -- 15.3* 15.4*  HGB -- 8.9* -- 9.2* 9.2*  PLT -- 267 -- 235 237  LABCREA -- -- -- -- --  CREATININE 1.72* -- 1.84* 1.98* --   Estimated Creatinine Clearance: 58.3 ml/min (by C-G formula based on Cr of 1.72). No results found for this basename: VANCOTROUGH:2,VANCOPEAK:2,VANCORANDOM:2,GENTTROUGH:2,GENTPEAK:2,GENTRANDOM:2,TOBRATROUGH:2,TOBRAPEAK:2,TOBRARND:2,AMIKACINPEAK:2,AMIKACINTROU:2,AMIKACIN:2, in the last 72 hours   Microbiology: Recent Results (from the past 720 hour(s))  CULTURE, BLOOD (ROUTINE X 2)     Status: Normal (Preliminary result)   Collection Time   01/28/12 11:10 AM      Component Value Range Status Comment   Specimen Description BLOOD RIGHT FOREARM   Final    Special Requests BOTTLES DRAWN AEROBIC AND ANAEROBIC 5CC EACH   Final    Culture  Setup Time [id number]   Final    Culture     Final    Value:        BLOOD CULTURE RECEIVED NO GROWTH TO DATE CULTURE WILL BE HELD FOR 5 DAYS BEFORE ISSUING A FINAL NEGATIVE REPORT   Report Status PENDING   Incomplete   CULTURE, BLOOD (ROUTINE X 2)     Status: Normal (Preliminary result)   Collection Time   01/28/12 11:45 AM      Component Value Range Status Comment   Specimen Description BLOOD RIGHT HAND    Final    Special Requests BOTTLES DRAWN AEROBIC AND ANAEROBIC 5CC   Final    Culture  Setup Time [id number]   Final    Culture     Final    Value:        BLOOD CULTURE RECEIVED NO GROWTH TO DATE CULTURE WILL BE HELD FOR 5 DAYS BEFORE ISSUING A FINAL NEGATIVE REPORT   Report Status PENDING   Incomplete   CULTURE, ROUTINE-ABSCESS     Status: Normal (Preliminary result)   Collection Time   01/28/12  7:16 PM      Component Value Range Status Comment   Specimen Description HAND   Final    Special Requests NONE   Final    Gram Stain PENDING   Incomplete    Culture NO GROWTH 2 DAYS   Final    Report Status PENDING   Incomplete   ANAEROBIC CULTURE     Status: Normal (Preliminary result)   Collection Time   01/28/12  7:16 PM      Component Value Range Status Comment   Specimen Description HAND   Final    Special Requests NONE   Final    Gram Stain PENDING   Incomplete    Culture     Final    Value: NO ANAEROBES ISOLATED; CULTURE IN PROGRESS FOR 5 DAYS   Report Status PENDING   Incomplete   FUNGUS CULTURE W SMEAR  Status: Normal (Preliminary result)   Collection Time   01/28/12  7:16 PM      Component Value Range Status Comment   Specimen Description HAND   Final    Special Requests NONE   Final    Fungal Smear NO YEAST OR FUNGAL ELEMENTS SEEN   Final    Culture CULTURE IN PROGRESS FOR FOUR WEEKS   Final    Report Status PENDING   Incomplete   CULTURE, ROUTINE-ABSCESS     Status: Normal (Preliminary result)   Collection Time   01/28/12  7:20 PM      Component Value Range Status Comment   Specimen Description HAND   Final    Special Requests NONE   Final    Gram Stain     Final    Value: RARE WBC PRESENT,BOTH PMN AND MONONUCLEAR     NO SQUAMOUS EPITHELIAL CELLS SEEN     NO ORGANISMS SEEN   Culture NO GROWTH 2 DAYS   Final    Report Status PENDING   Incomplete   ANAEROBIC CULTURE     Status: Normal (Preliminary result)   Collection Time   01/28/12  7:20 PM      Component Value Range  Status Comment   Specimen Description HAND   Final    Special Requests NONE   Final    Gram Stain     Final    Value: RARE WBC PRESENT,BOTH PMN AND MONONUCLEAR     NO SQUAMOUS EPITHELIAL CELLS SEEN     NO ORGANISMS SEEN   Culture     Final    Value: NO ANAEROBES ISOLATED; CULTURE IN PROGRESS FOR 5 DAYS   Report Status PENDING   Incomplete   FUNGUS CULTURE W SMEAR     Status: Normal (Preliminary result)   Collection Time   01/28/12  7:20 PM      Component Value Range Status Comment   Specimen Description HAND   Final    Special Requests NONE   Final    Fungal Smear NO YEAST OR FUNGAL ELEMENTS SEEN   Final    Culture CULTURE IN PROGRESS FOR FOUR WEEKS   Final    Report Status PENDING   Incomplete   CULTURE, ROUTINE-ABSCESS     Status: Normal (Preliminary result)   Collection Time   01/28/12  7:22 PM      Component Value Range Status Comment   Specimen Description WRIST   Final    Special Requests NONE   Final    Gram Stain PENDING   Incomplete    Culture NO GROWTH 2 DAYS   Final    Report Status PENDING   Incomplete   ANAEROBIC CULTURE     Status: Normal (Preliminary result)   Collection Time   01/28/12  7:22 PM      Component Value Range Status Comment   Specimen Description WRIST   Final    Special Requests NONE   Final    Gram Stain PENDING   Incomplete    Culture     Final    Value: NO ANAEROBES ISOLATED; CULTURE IN PROGRESS FOR 5 DAYS   Report Status PENDING   Incomplete   FUNGUS CULTURE W SMEAR     Status: Normal (Preliminary result)   Collection Time   01/28/12  7:22 PM      Component Value Range Status Comment   Specimen Description WRIST   Final    Special Requests NONE   Final  Fungal Smear NO YEAST OR FUNGAL ELEMENTS SEEN   Final    Culture CULTURE IN PROGRESS FOR FOUR WEEKS   Final    Report Status PENDING   Incomplete     Medical History: Past Medical History  Diagnosis Date  . Diabetes mellitus   . HTN (hypertension)   . Osteomyelitis     Medications:    Scheduled:     . amLODipine  10 mg Oral Daily  . docusate sodium  100 mg Oral BID  . fentaNYL      . ferrous sulfate  325 mg Oral TID WC  . HYDROmorphone      . imipenem-cilastatin  250 mg Intravenous Q6H  . insulin aspart  0-15 Units Subcutaneous Q4H  . insulin glargine  15 Units Subcutaneous QHS  . vancomycin  1,500 mg Intravenous Q24H  . vitamin C  1,000 mg Oral Daily  . DISCONTD: amLODipine  5 mg Oral Daily   Infusions:     . sodium chloride 125 mL/hr at 01/31/12 0627  . DISCONTD: 0.9 % NaCl with KCl 20 mEq / L 125 mL/hr at 01/30/12 0941   Assessment:  61 yr male with left hand infection - cellulitis with abscess, also sinusitis.  S/P I&D of wound 2/2 and 2/3.   Day #3 empiric Vanc & Primaxin.  Fever curve improving.  SCr cont to improve.   All cxs ngtd.  Goal of Therapy:  Vancomycin trough level 15-20 mcg/ml  Plan:   Change Primaxin to 500mg  IV q8h  Cont Vancomycin 1500mg  IV q24h  Check Vanc trough tonight. New goal 15-1mcg/ml since treating abscess.  Lolita Patella 01/31/2012,10:41 AM

## 2012-01-31 NOTE — Progress Notes (Signed)
Occupational Therapy Treatment Patient Details Name: [name] MRN: [id number] DOB: 04-19-1951 Today's Date: 01/31/2012  OT Assessment/Plan OT Assessment/Plan OT Plan: Discharge plan remains appropriate OT Frequency: Min 3X/week Follow Up Recommendations: Outpatient OT Equipment Recommended: None recommended by OT OT Goals ADL Goals Pt Will Perform Grooming: with modified independence;Unsupported;Standing at sink Pt Will Perform Upper Body Bathing: with min assist;Standing at sink;Unsupported Pt Will Perform Lower Body Bathing: with set-up;with supervision;Unsupported;Other (comment) Pt Will Perform Upper Body Dressing: with set-up;with supervision;Sit to stand from bed;Unsupported;Sit to stand from chair Pt Will Perform Lower Body Dressing: with set-up;with supervision;Sit to stand from bed;Sit to stand from chair;Unsupported Pt Will Transfer to Toilet: with modified independence;Ambulation;Regular height toilet Pt Will Perform Toileting - Clothing Manipulation: with modified independence;Standing Arm Goals Pt Will Perform AROM: Independently;Left upper extremity;1 set;10 reps;Other (comment) Arm Goal: AROM - Progress: Progressing toward goal Additional Arm Goal #2: Pt will instruct caregiver in positioning LUE for edema management Arm Goal: Additional Goal #2 - Progress: Goal set today  OT Treatment Precautions/Restrictions  Restrictions Weight Bearing Restrictions: Yes LUE Weight Bearing: Non weight bearing Other Position/Activity Restrictions: keep elevated above heart on pillows or mission sling. Demonstrated and explained positioning with pillows for home.  Explained mission sling being used as sling when walking.  Pt had strap around Waist to secure gown.  Did not want me to convert to sling like he'll wear at home today.     ADL ADL ADL Comments: Pt is R dominant:  doing as much of ADL as he can:  LUE nonfunctional assist secondary to splint.  Pt feels he can get off  commode at home:  sink on right side.  Will have intermittent help through the day Mobility    Exercises General Exercises - Upper Extremity Shoulder Flexion: AAROM Shoulder Extension: AAROM (AAROM flexion and extension) Digit Composite Flexion: AROM/PROM Composite Extension: AROM/PROM Other Exercises Other Exercises:  (PROM all digits within splint confines; wfls but stiffness; did composite and IP blocked ) Other Exercises:  (Pt's fingers did not have edema today)  End of Session OT - End of Session Activity Tolerance: Patient tolerated treatment well Patient left: in chair;with call bell in reach General Behavior During Session: [facility] for tasks performed Cognition: [facility] for tasks performed [facility], OTR/L [id number] 01/31/2012 [name]  01/31/2012, 11:47 AM

## 2012-02-01 ENCOUNTER — Inpatient Hospital Stay (HOSPITAL_COMMUNITY): Payer: Self-pay | Admitting: Anesthesiology

## 2012-02-01 ENCOUNTER — Encounter (HOSPITAL_COMMUNITY): Payer: Self-pay | Admitting: Anesthesiology

## 2012-02-01 ENCOUNTER — Encounter (HOSPITAL_COMMUNITY)
Admission: EM | Disposition: A | Discharge status: Home Health | Payer: Self-pay | Source: Home / Self Care | Attending: Internal Medicine

## 2012-02-01 ENCOUNTER — Encounter (HOSPITAL_COMMUNITY): Payer: Self-pay | Admitting: Orthopedic Surgery

## 2012-02-01 HISTORY — PX: I&D EXTREMITY: SHX5045

## 2012-02-01 LAB — GLUCOSE, CAPILLARY: Glucose-Capillary: 81 mg/dL (ref 70–99)

## 2012-02-01 LAB — CULTURE, ROUTINE-ABSCESS: Culture: NO GROWTH

## 2012-02-01 LAB — VANCOMYCIN, TROUGH: Vancomycin Tr: 12.2 ug/mL (ref 10.0–20.0)

## 2012-02-01 LAB — CBC
HCT: 26.4 % — ABNORMAL LOW (ref 39.0–52.0)
MCHC: 33.3 g/dL (ref 30.0–36.0)
MCV: 82 fL (ref 78.0–100.0)
RDW: 14.9 % (ref 11.5–15.5)

## 2012-02-01 SURGERY — IRRIGATION AND DEBRIDEMENT EXTREMITY
Anesthesia: General | Site: Hand | Laterality: Left | Wound class: Dirty or Infected

## 2012-02-01 MED ORDER — MIDAZOLAM HCL 5 MG/5ML IJ SOLN
INTRAMUSCULAR | Status: DC | PRN
  Administered 2012-02-01: 2 mg via INTRAVENOUS

## 2012-02-01 MED ORDER — SODIUM CHLORIDE 0.9 % IV SOLN
INTRAVENOUS | Status: DC | PRN
  Administered 2012-02-01: 18:00:00 via INTRAVENOUS

## 2012-02-01 MED ORDER — ACETAMINOPHEN 10 MG/ML IV SOLN
INTRAVENOUS | Status: DC | PRN
  Administered 2012-02-01: 1000 mg via INTRAVENOUS

## 2012-02-01 MED ORDER — VANCOMYCIN HCL 1000 MG IV SOLR
1750.0000 mg | INTRAVENOUS | Status: DC
  Administered 2012-02-01 – 2012-02-04 (×4): 1750 mg via INTRAVENOUS
  Filled 2012-02-01 (×5): qty 1750

## 2012-02-01 MED ORDER — HYDRALAZINE HCL 20 MG/ML IJ SOLN
5.0000 mg | Freq: Once | INTRAMUSCULAR | Status: AC
  Administered 2012-02-01: 5 mg via INTRAVENOUS
  Filled 2012-02-01: qty 1

## 2012-02-01 MED ORDER — PROPOFOL 10 MG/ML IV EMUL
INTRAVENOUS | Status: DC | PRN
  Administered 2012-02-01: 200 mg via INTRAVENOUS

## 2012-02-01 MED ORDER — FENTANYL CITRATE 0.05 MG/ML IJ SOLN
INTRAMUSCULAR | Status: DC | PRN
  Administered 2012-02-01 (×4): 50 ug via INTRAVENOUS

## 2012-02-01 MED ORDER — FUROSEMIDE 10 MG/ML IJ SOLN
20.0000 mg | Freq: Once | INTRAMUSCULAR | Status: AC
  Administered 2012-02-01: 20 mg via INTRAVENOUS
  Filled 2012-02-01: qty 2

## 2012-02-01 MED ORDER — PROMETHAZINE HCL 25 MG/ML IJ SOLN: 6.2500 mg | INTRAMUSCULAR | Status: DC | PRN

## 2012-02-01 MED ORDER — LIDOCAINE HCL 1 % IJ SOLN
INTRAMUSCULAR | Status: DC | PRN
  Administered 2012-02-01: 80 mg via INTRADERMAL

## 2012-02-01 MED ORDER — ONDANSETRON HCL 4 MG/2ML IJ SOLN
INTRAMUSCULAR | Status: DC | PRN
  Administered 2012-02-01: 4 mg via INTRAVENOUS

## 2012-02-01 MED ORDER — HYDROMORPHONE HCL PF 1 MG/ML IJ SOLN
0.2500 mg | INTRAMUSCULAR | Status: DC | PRN
  Administered 2012-02-01 (×2): 0.5 mg via INTRAVENOUS

## 2012-02-01 SURGICAL SUPPLY — 40 items
BAG ZIPLOCK 12X15 (MISCELLANEOUS) ×3 IMPLANT
BANDAGE ELASTIC 4 VELCRO ST LF (GAUZE/BANDAGES/DRESSINGS) ×3 IMPLANT
BANDAGE ELASTIC 6 VELCRO ST LF (GAUZE/BANDAGES/DRESSINGS) ×3 IMPLANT
BANDAGE GAUZE ELAST BULKY 4 IN (GAUZE/BANDAGES/DRESSINGS) ×3 IMPLANT
BLADE SURG SZ10 CARB STEEL (BLADE) ×6 IMPLANT
CLOTH BEACON ORANGE TIMEOUT ST (SAFETY) ×3 IMPLANT
CUFF TOURN SGL QUICK 18 (TOURNIQUET CUFF) ×3 IMPLANT
DRAIN PENROSE 18X1/2 LTX STRL (DRAIN) ×3 IMPLANT
DRAPE SURG 17X11 SM STRL (DRAPES) ×3 IMPLANT
DRSG EMULSION OIL 3X16 NADH (GAUZE/BANDAGES/DRESSINGS) ×3 IMPLANT
DRSG PAD ABDOMINAL 8X10 ST (GAUZE/BANDAGES/DRESSINGS) ×3 IMPLANT
ELECT REM PT RETURN 9FT ADLT (ELECTROSURGICAL) ×3
ELECTRODE REM PT RTRN 9FT ADLT (ELECTROSURGICAL) ×2 IMPLANT
GAUZE SPONGE 4X4 12PLY STRL LF (GAUZE/BANDAGES/DRESSINGS) ×6 IMPLANT
GAUZE SPONGE 4X4 16PLY XRAY LF (GAUZE/BANDAGES/DRESSINGS) ×6 IMPLANT
GAUZE XEROFORM 1X8 LF (GAUZE/BANDAGES/DRESSINGS) ×3 IMPLANT
GAUZE XEROFORM 5X9 LF (GAUZE/BANDAGES/DRESSINGS) ×3 IMPLANT
GLOVE BIO SURGEON STRL SZ8 (GLOVE) ×3 IMPLANT
GOWN STRL REIN XL XLG (GOWN DISPOSABLE) ×6 IMPLANT
IV NS IRRIG 3000ML ARTHROMATIC (IV SOLUTION) ×6 IMPLANT
KIT BASIN OR (CUSTOM PROCEDURE TRAY) ×3 IMPLANT
MANIFOLD NEPTUNE II (INSTRUMENTS) ×3 IMPLANT
NS IRRIG 1000ML POUR BTL (IV SOLUTION) IMPLANT
PACK LOWER EXTREMITY WL (CUSTOM PROCEDURE TRAY) ×3 IMPLANT
PAD CAST 4YDX4 CTTN HI CHSV (CAST SUPPLIES) ×2 IMPLANT
PADDING CAST COTTON 4X4 STRL (CAST SUPPLIES) ×1
PADDING WEBRIL 4 STERILE (GAUZE/BANDAGES/DRESSINGS) ×6 IMPLANT
POSITIONER SURGICAL ARM (MISCELLANEOUS) ×3 IMPLANT
SOL PREP POV-IOD 16OZ 10% (MISCELLANEOUS) ×3 IMPLANT
SOL PREP PROV IODINE SCRUB 4OZ (MISCELLANEOUS) ×3 IMPLANT
SPONGE GAUZE 4X4 12PLY (GAUZE/BANDAGES/DRESSINGS) ×6 IMPLANT
SUT PROLENE 3 0 PS 2 (SUTURE) ×3 IMPLANT
SUT VIC AB 1 CT1 27 (SUTURE) ×1
SUT VIC AB 1 CT1 27XBRD ANTBC (SUTURE) ×2 IMPLANT
SUT VIC AB 2-0 CT1 27 (SUTURE) ×1
SUT VIC AB 2-0 CT1 27XBRD (SUTURE) ×2 IMPLANT
SYR 20CC LL (SYRINGE) ×3 IMPLANT
SYR CONTROL 10ML LL (SYRINGE) ×3 IMPLANT
TOWEL OR 17X26 10 PK STRL BLUE (TOWEL DISPOSABLE) ×6 IMPLANT
WATER STERILE IRR 1500ML POUR (IV SOLUTION) IMPLANT

## 2012-02-01 NOTE — Transfer of Care (Signed)
Immediate Anesthesia Transfer of Care Note  Patient: [name]  Procedure(s) Performed:  IRRIGATION AND DEBRIDEMENT EXTREMITY - Left Wrist, Hand and Forearm  Patient Location: PACU  Anesthesia Type: General  Level of Consciousness: awake and alert   Airway & Oxygen Therapy: Patient Spontanous Breathing and Patient connected to face mask oxygen  Post-op Assessment: Report given to PACU RN and Post -op Vital signs reviewed and stable  Post vital signs: Reviewed and stable  Complications: No apparent anesthesia complications

## 2012-02-01 NOTE — Progress Notes (Signed)
ANTIBIOTIC CONSULT NOTE - follow up  Pharmacy Consult for Primaxin & Vancomycin Indication: Left hand cellulitis with abscess  Allergies  Allergen Reactions  . Penicillins Hives and Swelling   Patient Measurements: Height: 6\' 2"  (188 cm) Weight: 225 lb (102.059 kg) IBW/kg (Calculated) : 82.2   Vital Signs: Temp: 100.4 F (38 C) (02/04 2140) Temp src: Oral (02/04 2140) BP: 169/78 mmHg (02/04 2140) Pulse Rate: 91  (02/04 2140) Intake/Output from previous day: 02/04 0701 - 02/05 0700 In: 600 [P.O.:600] Out: 225 [Urine:225] Intake/Output from this shift:    Labs:  Basename 01/31/12 0325 01/30/12 0956 01/30/12 0400 01/29/12 0310  WBC -- 16.1* -- 15.3*  HGB -- 8.9* -- 9.2*  PLT -- 267 -- 235  LABCREA -- -- -- --  CREATININE 1.72* -- 1.84* 1.98*   Estimated Creatinine Clearance: 58.3 ml/min (by C-G formula based on Cr of 1.72).  Basename 01/31/12 2330  VANCOTROUGH 12.2  VANCOPEAK --  VANCORANDOM --  GENTTROUGH --  GENTPEAK --  GENTRANDOM --  TOBRATROUGH --  TOBRAPEAK --  TOBRARND --  AMIKACINPEAK --  AMIKACINTROU --  AMIKACIN --     Microbiology: Recent Results (from the past 720 hour(s))  CULTURE, BLOOD (ROUTINE X 2)     Status: Normal (Preliminary result)   Collection Time   01/28/12 11:10 AM      Component Value Range Status Comment   Specimen Description BLOOD RIGHT FOREARM   Final    Special Requests BOTTLES DRAWN AEROBIC AND ANAEROBIC 5CC EACH   Final    Culture  Setup Time [id number]   Final    Culture     Final    Value:        BLOOD CULTURE RECEIVED NO GROWTH TO DATE CULTURE WILL BE HELD FOR 5 DAYS BEFORE ISSUING A FINAL NEGATIVE REPORT   Report Status PENDING   Incomplete   CULTURE, BLOOD (ROUTINE X 2)     Status: Normal (Preliminary result)   Collection Time   01/28/12 11:45 AM      Component Value Range Status Comment   Specimen Description BLOOD RIGHT HAND   Final    Special Requests BOTTLES DRAWN AEROBIC AND ANAEROBIC 5CC   Final    Culture   Setup Time [id number]   Final    Culture     Final    Value:        BLOOD CULTURE RECEIVED NO GROWTH TO DATE CULTURE WILL BE HELD FOR 5 DAYS BEFORE ISSUING A FINAL NEGATIVE REPORT   Report Status PENDING   Incomplete   CULTURE, ROUTINE-ABSCESS     Status: Normal (Preliminary result)   Collection Time   01/28/12  7:16 PM      Component Value Range Status Comment   Specimen Description HAND   Final    Special Requests NONE   Final    Gram Stain PENDING   Incomplete    Culture NO GROWTH 2 DAYS   Final    Report Status PENDING   Incomplete   ANAEROBIC CULTURE     Status: Normal (Preliminary result)   Collection Time   01/28/12  7:16 PM      Component Value Range Status Comment   Specimen Description HAND   Final    Special Requests NONE   Final    Gram Stain PENDING   Incomplete    Culture     Final    Value: NO ANAEROBES ISOLATED; CULTURE IN PROGRESS FOR 5 DAYS  Report Status PENDING   Incomplete   FUNGUS CULTURE W SMEAR     Status: Normal (Preliminary result)   Collection Time   01/28/12  7:16 PM      Component Value Range Status Comment   Specimen Description HAND   Final    Special Requests NONE   Final    Fungal Smear NO YEAST OR FUNGAL ELEMENTS SEEN   Final    Culture CULTURE IN PROGRESS FOR FOUR WEEKS   Final    Report Status PENDING   Incomplete   CULTURE, ROUTINE-ABSCESS     Status: Normal (Preliminary result)   Collection Time   01/28/12  7:20 PM      Component Value Range Status Comment   Specimen Description HAND   Final    Special Requests NONE   Final    Gram Stain     Final    Value: RARE WBC PRESENT,BOTH PMN AND MONONUCLEAR     NO SQUAMOUS EPITHELIAL CELLS SEEN     NO ORGANISMS SEEN   Culture NO GROWTH 2 DAYS   Final    Report Status PENDING   Incomplete   ANAEROBIC CULTURE     Status: Normal (Preliminary result)   Collection Time   01/28/12  7:20 PM      Component Value Range Status Comment   Specimen Description HAND   Final    Special Requests NONE   Final     Gram Stain     Final    Value: RARE WBC PRESENT,BOTH PMN AND MONONUCLEAR     NO SQUAMOUS EPITHELIAL CELLS SEEN     NO ORGANISMS SEEN   Culture     Final    Value: NO ANAEROBES ISOLATED; CULTURE IN PROGRESS FOR 5 DAYS   Report Status PENDING   Incomplete   FUNGUS CULTURE W SMEAR     Status: Normal (Preliminary result)   Collection Time   01/28/12  7:20 PM      Component Value Range Status Comment   Specimen Description HAND   Final    Special Requests NONE   Final    Fungal Smear NO YEAST OR FUNGAL ELEMENTS SEEN   Final    Culture CULTURE IN PROGRESS FOR FOUR WEEKS   Final    Report Status PENDING   Incomplete   CULTURE, ROUTINE-ABSCESS     Status: Normal (Preliminary result)   Collection Time   01/28/12  7:22 PM      Component Value Range Status Comment   Specimen Description WRIST   Final    Special Requests NONE   Final    Gram Stain PENDING   Incomplete    Culture NO GROWTH 2 DAYS   Final    Report Status PENDING   Incomplete   ANAEROBIC CULTURE     Status: Normal (Preliminary result)   Collection Time   01/28/12  7:22 PM      Component Value Range Status Comment   Specimen Description WRIST   Final    Special Requests NONE   Final    Gram Stain PENDING   Incomplete    Culture     Final    Value: NO ANAEROBES ISOLATED; CULTURE IN PROGRESS FOR 5 DAYS   Report Status PENDING   Incomplete   FUNGUS CULTURE W SMEAR     Status: Normal (Preliminary result)   Collection Time   01/28/12  7:22 PM      Component Value Range Status Comment  Specimen Description WRIST   Final    Special Requests NONE   Final    Fungal Smear NO YEAST OR FUNGAL ELEMENTS SEEN   Final    Culture CULTURE IN PROGRESS FOR FOUR WEEKS   Final    Report Status PENDING   Incomplete     Medical History: Past Medical History  Diagnosis Date  . Diabetes mellitus   . HTN (hypertension)   . Osteomyelitis     Medications:  Scheduled:     . amLODipine  10 mg Oral Daily  . docusate sodium  100 mg Oral BID  .  ferrous sulfate  325 mg Oral TID WC  . imipenem-cilastatin  500 mg Intravenous Q8H  . insulin aspart  0-15 Units Subcutaneous Q4H  . insulin glargine  15 Units Subcutaneous QHS  . vancomycin  1,500 mg Intravenous Q24H  . vitamin C  1,000 mg Oral Daily  . DISCONTD: imipenem-cilastatin  250 mg Intravenous Q6H   Infusions:     . sodium chloride 75 mL/hr at 01/31/12 1132   Assessment:  61 yr male with left hand infection - cellulitis with abscess, also sinusitis.  S/P I&D of wound 2/2 and 2/3.   Day #3 empiric Vanc & Primaxin.  Fever curve improving.  SCr cont to improve.   All cxs ngtd.  VT = 12.2 mg/L after 1500 mg IV q24h @ [facility].  Goal of Therapy:  Vancomycin trough level 15-20 mcg/ml  Plan:   Change Primaxin to 500mg  IV q8h  Increase Vancomycin to 1750mg  IV q24h.  F/U SCr/Levels as needed.   Dorrene German 02/01/2012,12:54 AM

## 2012-02-01 NOTE — Preoperative (Signed)
Beta Blockers   Reason not to administer Beta Blockers:Not Applicable 

## 2012-02-01 NOTE — Brief Op Note (Signed)
01/28/2012 - 02/01/2012  6:56 PM  PATIENT:  [name]  61 y.o. male  PRE-OPERATIVE DIAGNOSIS:  Infection Left Wrist, Arm and Hand  POST-OPERATIVE DIAGNOSIS:  Infection Left Wrist, Arm and Hand  PROCEDURE:  Procedure(s): #1 irrigation and debridement radiocarpal midcarpal and CMC joint left wrist with arthrotomy and synovectomy #2 radical extensor tendon tenosynovectomy left wrist including second through fourth dorsal compartments #3 radical flexor tendon tenosynovectomy wrist and forearm #4 FDP and FDS tenosynovectomy  middle ring and small finger flexors #5 complex closure left dorsal wrist, left volar wrist and forearm. Loose closure mid-palm incision SURGEON:  Surgeon(s): Paulene Floor, MD  PHYSICIAN ASSISTANT:   ASSISTANTS: None   ANESTHESIA:   general  EBL:  Total I/O In: 0  Out: 1400 [Urine:1400]  BLOOD ADMINISTERED:none  DRAINS: Penrose drain in the Wrist and hand   LOCAL MEDICATIONS USED:  NONE  SPECIMEN:  No Specimen  DISPOSITION OF SPECIMEN:  N/A  COUNTS:  YES  TOURNIQUET:  * No tourniquets in log *  DICTATION: .Other Dictation: Dictation Number [id number]  PLAN OF CARE: Admit to inpatient   PATIENT DISPOSITION:  PACU - hemodynamically stable.   Delay start of Pharmacological VTE agent (>24hrs) due to surgical blood loss or risk of bleeding:  {YES/NO/NOT APPLICABLE:20182

## 2012-02-01 NOTE — Anesthesia Preprocedure Evaluation (Addendum)
Anesthesia Evaluation  Patient identified by MRN, date of birth, ID band Patient awake    Reviewed: Allergy & Precautions, H&P , NPO status , Patient's Chart, lab work & pertinent test results, reviewed documented beta blocker date and time   Airway Mallampati: II TM Distance: >3 FB Neck ROM: Full    Dental  (+) Dental Advisory Given   Pulmonary former smoker clear to auscultation        Cardiovascular hypertension, Pt. on medications neg cardio ROS Regular Normal Denies cardiac symptoms   Neuro/Psych Negative Neurological ROS  Negative Psych ROS   GI/Hepatic negative GI ROS, Neg liver ROS,   Endo/Other  Diabetes mellitus-, Type 2, Insulin DependentSSI peri-op  Renal/GU Renal impairment Cr 1.72  Genitourinary negative   Musculoskeletal negative musculoskeletal ROS (+)   Abdominal   Peds negative pediatric ROS (+)  Hematology Anemia , Hgb 8.8   Anesthesia Other Findings Poor dentition  Reproductive/Obstetrics negative OB ROS                           Anesthesia Physical Anesthesia Plan  ASA: III  Anesthesia Plan: General   Post-op Pain Management:    Induction: Intravenous  Airway Management Planned: LMA  Additional Equipment:   Intra-op Plan:   Post-operative Plan: Extubation in OR  Informed Consent: I have reviewed the patients History and Physical, chart, labs and discussed the procedure including the risks, benefits and alternatives for the proposed anesthesia with the patient or authorized representative who has indicated his/her understanding and acceptance.   Dental advisory given  Plan Discussed with: CRNA and Surgeon  Anesthesia Plan Comments:         Anesthesia Quick Evaluation

## 2012-02-01 NOTE — Anesthesia Postprocedure Evaluation (Signed)
  Anesthesia Post-op Note  Patient: [name]  Procedure(s) Performed:  IRRIGATION AND DEBRIDEMENT EXTREMITY - Left Wrist, Hand and Forearm  Patient Location: PACU  Anesthesia Type: General  Level of Consciousness: oriented and sedated  Airway and Oxygen Therapy: Patient Spontanous Breathing and Patient connected to nasal cannula oxygen  Post-op Pain: mild  Post-op Assessment: Post-op Vital signs reviewed, Patient's Cardiovascular Status Stable, Respiratory Function Stable and Patent Airway  Post-op Vital Signs: stable  Complications: No apparent anesthesia complications

## 2012-02-01 NOTE — Progress Notes (Signed)
Patient ID: [name], male   DOB: 21-Jun-1951, 61 y.o.   MRN: [id number] Patient presents for repeat irrigation debridement possible closure. I discussed him the do's and don'ts etc.  He is improving his white count is trending down and he is stable.  No signs of DVT infection or vascular compromise.  We'll continue aggressive approach to try to eradicate Jones infection with no guarantee approach. Full medical problems and understands the plan of attack.  Marland Kitchen.We are planning surgery for your upper extremity. The risk and benefits of surgery include risk of bleeding infection anesthesia damage to normal structures and failure of the surgery to accomplish its intended goals of relieving symptoms and restoring function with this in mind we'll going to proceed. I have specifically discussed with the patient the pre-and postoperative regime and the does and don'ts and risk and benefits in great detail. Risk and benefits of surgery also include risk of dystrophy chronic nerve pain failure of the healing process to go onto completion and other inherent risks of surgery The relavent the pathophysiology of the disease/injury process, as well as the alternatives for treatment and postoperative course of action has been discussed in great detail with the patient who desires to proceed.  We will do everything in our power to help you (the patient) restore function to the upper extremity. Is a pleasure to see this patient today.  Roseanne Kaufman MD

## 2012-02-01 NOTE — Evaluation (Addendum)
Physical Therapy Evaluation Patient Details Name: [name] MRN: [id number] DOB: 1951/10/11 Today's Date: 02/01/2012  [id number] EVI  Problem List:  Patient Active Problem List  Diagnoses  . Diabetes mellitus  . Cellulitis and abscess of hand  . HTN (hypertension)  . ARF (acute renal failure)  . Hyponatremia  . Normocytic anemia  . Sinusitis    Past Medical History:  Past Medical History  Diagnosis Date  . Diabetes mellitus   . HTN (hypertension)   . Osteomyelitis    Past Surgical History:  Past Surgical History  Procedure Date  . 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 Cholecystectomy   . Tonsillectomy   . I&d extremity 01/28/2012    Procedure: IRRIGATION AND DEBRIDEMENT EXTREMITY;  Surgeon: Paulene Floor, MD;  Location: [facility];  Service: Orthopedics;  Laterality: Left;  irrigation and drainage left hand  . I&d extremity 01/30/2012    Procedure: IRRIGATION AND DEBRIDEMENT EXTREMITY;  Surgeon: Paulene Floor, MD;  Location: [facility];  Service: Orthopedics;  Laterality: Left;  flexor teno synovectomy and extensor tenosynovectomy arthrosynovectomy wristjoint    PT Assessment/Plan/Recommendation PT Assessment Clinical Impression Statement: Pt presents s/p cellulitis with abcess of hand and I and D x2 with 3rd scheduled for this afternoon.  Pt ambulates well without assistive device.  Communicated with RN about mobility status and to have assist with IV when ambulating.  Pt requires no further PT/follow up PT.  Pt states that he continues to perform exercises on hand and is able to don sling independently around shoulder.    PT Recommendation/Assessment: Patent does not need any further PT services No Skilled PT: All education completed;Patient is modified independent with all activity/mobility;Patient is supervision for all activity/mobility PT Recommendation Follow Up Recommendations: No PT follow up Equipment Recommended: None recommended by PT PT Goals     PT  Evaluation Precautions/Restrictions  Restrictions Weight Bearing Restrictions: Yes LUE Weight Bearing: Non weight bearing Other Position/Activity Restrictions: keep elevated above heart on pillows or mission sling. Pt said he would like to try the mission sling  Prior [place]din Lives With: Spouse Receives Help From: Family Type of Home: House Home Layout: Multi-level Alternate Level Stairs-Rails: Left Alternate Level Stairs-Number of Steps: 3 flights of stairs Home Access: Stairs to enter Entrance Stairs-Rails: None Entrance Stairs-Number of Steps: 3 Home Adaptive Equipment: None Prior Function Level of Independence: Independent with basic ADLs;Independent with gait;Independent with transfers Driving: Yes Vocation: Full time employment Cognition Cognition Arousal/Alertness: Awake/alert Overall Cognitive Status: Appears within functional limits for tasks assessed Orientation Level: Oriented X4 Sensation/Coordination Sensation Light Touch: Appears Intact Coordination Gross Motor Movements are Fluid and Coordinated: Yes Extremity Assessment RLE Assessment RLE Assessment: Within Functional Limits LLE Assessment LLE Assessment: Within Functional Limits Mobility (including Balance) Bed Mobility Bed Mobility: No Transfers Transfers: Yes Sit to Stand: 6: Modified independent (Device/Increase time);From bed;With upper extremity assist (Requires slightly increased time, can don sling independentl) Stand to Sit: 6: Modified independent (Device/Increase time);With upper extremity assist;To chair/3-in-1 Ambulation/Gait Ambulation/Gait: Yes Ambulation/Gait Assistance: 5: Supervision Ambulation/Gait Assistance Details (indicate cue type and reason): Supervision for safety and assist with IV pole.   Ambulation Distance (Feet): 220 Feet Assistive device: None Gait Pattern: Within Functional Limits Gait velocity: WFL Stairs: No Wheelchair Mobility Wheelchair  Mobility: No    Exercise  End of Session PT - End of Session Activity Tolerance: Patient tolerated treatment well Patient left: in chair;with call bell in reach Nurse Communication: Mobility status for transfers;Mobility  status for ambulation General Behavior During Session: [facility] for tasks performed Cognition: [facility] for tasks performed  Page, Betha Loa 02/01/2012, 11:56 AM

## 2012-02-01 NOTE — Progress Notes (Signed)
Occupational Therapy Treatment Patient Details Name: JEFFRIE DICOLA MRN: [id number] DOB: [birth year] Today's Date: 02/01/2012  OT Assessment/Plan OT Assessment/Plan Comments on Treatment Session: Pt scheduled for another I&D 1700 OT Plan: Discharge plan remains appropriate OT Frequency: Min 3X/week Follow Up Recommendations: Outpatient OT Equipment Recommended: None recommended by OT OT Goals Acute Rehab OT Goals Time For Goal Achievement: 2 weeks ADL Goals Pt Will Perform Grooming: with modified independence;Unsupported;Standing at sink Pt Will Perform Upper Body Bathing: with min assist;Standing at sink;Unsupported Pt Will Perform Lower Body Bathing: with set-up;with supervision;Unsupported;Other (comment) Pt Will Perform Upper Body Dressing: with set-up;with supervision;Sit to stand from bed;Unsupported;Sit to stand from chair Pt Will Perform Lower Body Dressing: with set-up;with supervision;Sit to stand from bed;Sit to stand from chair;Unsupported Pt Will Transfer to Toilet: with modified independence;Ambulation;Regular height toilet Pt Will Perform Toileting - Clothing Manipulation: with modified independence;Standing Arm Goals Pt Will Perform AROM: Independently;Left upper extremity;1 set;10 reps;Other (comment) Arm Goal: AROM - Progress: Progressing toward goal Additional Arm Goal #2: Pt will instruct caregiver in positioning LUE for edema management Arm Goal: Additional Goal #2 - Progress: Progressing toward goals  OT Treatment Precautions/Restrictions   ADL ADL Ambulation Related to ADLs: pt states he is getting on off commode without grab bar ADL Comments: Pt is R dominant:  doing as much of ADL as he can:  LUE nonfunctional assist secondary to splint.  Pt feels he can get off commode at home:  sink on right side.  Will have intermittent help through the day Mobility    Exercises General Exercises - Upper Extremity Digit Composite Flexion: AROM Composite Extension:  AROM Other Exercises Other Exercises: Performed PROM of hand, all joints within confines of splint.  Pt. with full PROM, but stiffness noted PIP digits 2,3,5.   Pt. instructed to perform PROM hourly, including IP blocking.  Pt. performed 2 sets of 5 reps Composit flexion and ext within splint, Pt. with difficulty achieiving full PIP extension all digits, but this is improving. Pt has been performing exercises at least 3x/day:  Encouraged to increase to perform every 1-2 hours, doing at least composite P/AROM.   elevation of Lt. UE reinforced, and pt. placed in mission sling. (PROM all digits within splint confines; wfls but stiffness ) Other Exercises:  (pt performed all exercises:  cued to do 2 sets 5 as muscles fatique with AROM )  End of Session OT - End of Session Activity Tolerance: Patient tolerated treatment well Patient left: in chair;with call bell in reach General Behavior During Session: [facility] for tasks performed Cognition: [facility] for tasks performed [facility], OTR/L [id number] 02/01/2012 Shonn Farruggia  02/01/2012, 11:07 AM

## 2012-02-01 NOTE — Progress Notes (Signed)
Subjective:  -Pain controlled.  -s/p debriedment 2.03.2013 -On going fevers.  Antibiotics: Vanc: 2.1.2013>> Imipenem: 2.1.2013>>  CUltures: -Anaerobic 2.01.2013:  Negative till date. -Fungal culture 2.01.2013: Negative till date. -Routine culture 2.01.2013: Negative till date. Objective: Filed Vitals:   01/31/12 0525 01/31/12 1405 01/31/12 2140 02/01/12 0543  BP: 165/90 160/83 169/78 165/81  Pulse: 89 93 91 86  Temp: 100 F (37.8 C) 100 F (37.8 C) 100.4 F (38 C) 99.5 F (37.5 C)  TempSrc: Oral Oral Oral Oral  Resp: 19 16 18 18   Height:      Weight:      SpO2: 99% 98% 99% 100%   Weight change:   Intake/Output Summary (Last 24 hours) at 02/01/12 1056 Last data filed at 01/31/12 1405  Gross per 24 hour  Intake    360 ml  Output      0 ml  Net    360 ml    General: Alert, awake, oriented x3, in no acute distress.  HEENT: No bruits, no goiter.  Heart: Regular rate and rhythm, without murmurs, rubs, gallops.  Lungs: good air movement CTA B/L. Abdomen: Soft, nontender, nondistended, positive bowel sounds.  Neuro: able to move finger of left hand. But there is still pain with passive movement. Good vascular filling. Ext: hand dress and elevated. Edema +1 lower extremities B/L.  Lab Results:  Basename 01/31/12 0325 01/30/12 0400  NA 131* 133*  K 3.7 3.7  CL 101 102  CO2 21 22  GLUCOSE 147* 153*  BUN 20 26*  CREATININE 1.72* 1.84*  CALCIUM 8.3* 8.2*  MG -- --  PHOS -- --    Basename 01/31/12 0325  AST 103*  ALT 119*  ALKPHOS 92  BILITOT 0.3  PROT 7.0  ALBUMIN 1.9*    Basename 02/01/12 0322 01/30/12 0956  WBC 13.4* 16.1*  NEUTROABS -- 13.1*  HGB 8.8* 8.9*  HCT 26.4* 26.8*  MCV 82.0 83.2  PLT 368 267   No results found for this basename: HGBA1C:2 in the last 72 hours Micro Results: Recent Results (from the past 240 hour(s))  CULTURE, BLOOD (ROUTINE X 2)     Status: Normal (Preliminary result)   Collection Time   01/28/12 11:10 AM   Component Value Range Status Comment   Specimen Description BLOOD RIGHT FOREARM   Final    Special Requests BOTTLES DRAWN AEROBIC AND ANAEROBIC 5CC EACH   Final    Culture  Setup Time [id number]   Final    Culture     Final    Value:        BLOOD CULTURE RECEIVED NO GROWTH TO DATE CULTURE WILL BE HELD FOR 5 DAYS BEFORE ISSUING A FINAL NEGATIVE REPORT   Report Status PENDING   Incomplete   CULTURE, BLOOD (ROUTINE X 2)     Status: Normal (Preliminary result)   Collection Time   01/28/12 11:45 AM      Component Value Range Status Comment   Specimen Description BLOOD RIGHT HAND   Final    Special Requests BOTTLES DRAWN AEROBIC AND ANAEROBIC 5CC   Final    Culture  Setup Time [id number]   Final    Culture     Final    Value:        BLOOD CULTURE RECEIVED NO GROWTH TO DATE CULTURE WILL BE HELD FOR 5 DAYS BEFORE ISSUING A FINAL NEGATIVE REPORT   Report Status PENDING   Incomplete   CULTURE, ROUTINE-ABSCESS     Status: Normal (Preliminary  result)   Collection Time   01/28/12  7:16 PM      Component Value Range Status Comment   Specimen Description HAND   Final    Special Requests NONE   Final    Gram Stain PENDING   Incomplete    Culture NO GROWTH 3 DAYS   Final    Report Status PENDING   Incomplete   ANAEROBIC CULTURE     Status: Normal (Preliminary result)   Collection Time   01/28/12  7:16 PM      Component Value Range Status Comment   Specimen Description HAND   Final    Special Requests NONE   Final    Gram Stain PENDING   Incomplete    Culture     Final    Value: NO ANAEROBES ISOLATED; CULTURE IN PROGRESS FOR 5 DAYS   Report Status PENDING   Incomplete   FUNGUS CULTURE W SMEAR     Status: Normal (Preliminary result)   Collection Time   01/28/12  7:16 PM      Component Value Range Status Comment   Specimen Description HAND   Final    Special Requests NONE   Final    Fungal Smear NO YEAST OR FUNGAL ELEMENTS SEEN   Final    Culture CULTURE IN PROGRESS FOR FOUR WEEKS   Final     Report Status PENDING   Incomplete   CULTURE, ROUTINE-ABSCESS     Status: Normal   Collection Time   01/28/12  7:20 PM      Component Value Range Status Comment   Specimen Description HAND   Final    Special Requests NONE   Final    Gram Stain     Final    Value: RARE WBC PRESENT,BOTH PMN AND MONONUCLEAR     NO SQUAMOUS EPITHELIAL CELLS SEEN     NO ORGANISMS SEEN   Culture NO GROWTH 3 DAYS   Final    Report Status 02/01/2012 FINAL   Final   ANAEROBIC CULTURE     Status: Normal (Preliminary result)   Collection Time   01/28/12  7:20 PM      Component Value Range Status Comment   Specimen Description HAND   Final    Special Requests NONE   Final    Gram Stain     Final    Value: RARE WBC PRESENT,BOTH PMN AND MONONUCLEAR     NO SQUAMOUS EPITHELIAL CELLS SEEN     NO ORGANISMS SEEN   Culture     Final    Value: NO ANAEROBES ISOLATED; CULTURE IN PROGRESS FOR 5 DAYS   Report Status PENDING   Incomplete   FUNGUS CULTURE W SMEAR     Status: Normal (Preliminary result)   Collection Time   01/28/12  7:20 PM      Component Value Range Status Comment   Specimen Description HAND   Final    Special Requests NONE   Final    Fungal Smear NO YEAST OR FUNGAL ELEMENTS SEEN   Final    Culture CULTURE IN PROGRESS FOR FOUR WEEKS   Final    Report Status PENDING   Incomplete   CULTURE, ROUTINE-ABSCESS     Status: Normal (Preliminary result)   Collection Time   01/28/12  7:22 PM      Component Value Range Status Comment   Specimen Description WRIST   Final    Special Requests NONE   Final    Gram  Stain PENDING   Incomplete    Culture NO GROWTH 3 DAYS   Final    Report Status PENDING   Incomplete   ANAEROBIC CULTURE     Status: Normal (Preliminary result)   Collection Time   01/28/12  7:22 PM      Component Value Range Status Comment   Specimen Description WRIST   Final    Special Requests NONE   Final    Gram Stain PENDING   Incomplete    Culture     Final    Value: NO ANAEROBES ISOLATED; CULTURE IN  PROGRESS FOR 5 DAYS   Report Status PENDING   Incomplete   FUNGUS CULTURE W SMEAR     Status: Normal (Preliminary result)   Collection Time   01/28/12  7:22 PM      Component Value Range Status Comment   Specimen Description WRIST   Final    Special Requests NONE   Final    Fungal Smear NO YEAST OR FUNGAL ELEMENTS SEEN   Final    Culture CULTURE IN PROGRESS FOR FOUR WEEKS   Final    Report Status PENDING   Incomplete   MRSA PCR SCREENING     Status: Normal   Collection Time   02/01/12  7:56 AM      Component Value Range Status Comment   MRSA by PCR NEGATIVE  NEGATIVE  Final     Studies/Results: No results found.  Medications: I have reviewed the patient's current medications.  Assessment and plan: 1.Cellulitis and abscess of hand: -Patient on Vancomycin and Imipenem. S/p I & D x 2 (2.02.2013 and 2.03.2013)cultures results pending. Mild temperature overnight. But overall fever curve trending down continue current fever.  -Surgery today.  2. Diabetes mellitus -BG well controlled with SSI. Patient now eating.   3. HTN (hypertension) -BP high continue norvasc. One dose of lasix.  4. ARF (acute renal failure) -Improving with IV fluids, continue to hold ACE.  5. Hyponatremia -resolved.  6. Normocytic anemia -Hbg stable at 9.2.  -iron <10, ferritin high as expected. Start Iron sulfate will need evaluation as an outpatient.   7.Sinusitis  on broad spectrum antibiotics.    LOS: 4 days   Charlynne Cousins M.D. Pager: [phone] [facility] 02/01/2012, 10:56 AM

## 2012-02-02 LAB — GLUCOSE, CAPILLARY
Glucose-Capillary: 100 mg/dL — ABNORMAL HIGH (ref 70–99)
Glucose-Capillary: 227 mg/dL — ABNORMAL HIGH (ref 70–99)
Glucose-Capillary: 102 mg/dL — ABNORMAL HIGH (ref 70–99)
Glucose-Capillary: 120 mg/dL — ABNORMAL HIGH (ref 70–99)

## 2012-02-02 LAB — ANAEROBIC CULTURE

## 2012-02-02 MED ORDER — INSULIN ASPART 100 UNIT/ML ~~LOC~~ SOLN
0.0000 [IU] | Freq: Three times a day (TID) | SUBCUTANEOUS | Status: DC
  Administered 2012-02-03 (×2): 2 [IU] via SUBCUTANEOUS

## 2012-02-02 MED FILL — Neomycin-Bacitracin-Polymyxin Oint: CUTANEOUS | Qty: 30 | Status: AC

## 2012-02-02 NOTE — Progress Notes (Addendum)
Subjective:  -s/p debridement and closure 2.05.2013,Pain controlled. Chart reviewed.   Antibiotics: Vanc: 2.1.2013>> Imipenem: 2.1.2013>>  CUltures: -Anaerobic 2.01.2013:  Negative till date. -Fungal culture 2.01.2013: Negative till date. -Routine culture 2.01.2013: Negative till date. Objective: Filed Vitals:   02/01/12 2250 02/02/12 0001 02/02/12 0235 02/02/12 0631  BP: 160/85 162/82 160/70 154/80  Pulse:  81 87 84  Temp:  99.2 F (37.3 C) 99.6 F (37.6 C) 100 F (37.8 C)  TempSrc:  Oral Oral Oral  Resp:  18 18 18   Height:      Weight:      SpO2:  99% 99% 99%   Weight change:   Intake/Output Summary (Last 24 hours) at 02/02/12 1042 Last data filed at 02/02/12 0700  Gross per 24 hour  Intake    960 ml  Output   2000 ml  Net  -1040 ml    General: Alert, awake, oriented x3, in no acute distress.  HEENT: No bruits, no goiter.  Heart: Regular rate and rhythm, without murmurs, rubs, gallops.  Lungs: good air movement CTA B/L. Abdomen: Soft, nontender, nondistended, positive bowel sounds.  Ext: L. With and dressing clean and dry, elevated.able to move finger of left hand. But there is still pain with passive movement. Good vascular filling. Neuro: Alert and oriented x3 cranial nerves 2-12 grossly intact. Lab Results:  [facility] 01/31/12 0325  NA 131*  K 3.7  CL 101  CO2 21  GLUCOSE 147*  BUN 20  CREATININE 1.72*  CALCIUM 8.3*  MG --  PHOS --    Basename 01/31/12 0325  AST 103*  ALT 119*  ALKPHOS 92  BILITOT 0.3  PROT 7.0  ALBUMIN 1.9*    Basename 02/01/12 0322  WBC 13.4*  NEUTROABS --  HGB 8.8*  HCT 26.4*  MCV 82.0  PLT 368   No results found for this basename: HGBA1C:2 in the last 72 hours Micro Results: Recent Results (from the past 240 hour(s))  CULTURE, BLOOD (ROUTINE X 2)     Status: Normal (Preliminary result)   Collection Time   01/28/12 11:10 AM      Component Value Range Status Comment   Specimen Description BLOOD RIGHT FOREARM    Final    Special Requests BOTTLES DRAWN AEROBIC AND ANAEROBIC 5CC EACH   Final    Culture  Setup Time [id number]   Final    Culture     Final    Value:        BLOOD CULTURE RECEIVED NO GROWTH TO DATE CULTURE WILL BE HELD FOR 5 DAYS BEFORE ISSUING A FINAL NEGATIVE REPORT   Report Status PENDING   Incomplete   CULTURE, BLOOD (ROUTINE X 2)     Status: Normal (Preliminary result)   Collection Time   01/28/12 11:45 AM      Component Value Range Status Comment   Specimen Description BLOOD RIGHT HAND   Final    Special Requests BOTTLES DRAWN AEROBIC AND ANAEROBIC 5CC   Final    Culture  Setup Time [id number]   Final    Culture     Final    Value:        BLOOD CULTURE RECEIVED NO GROWTH TO DATE CULTURE WILL BE HELD FOR 5 DAYS BEFORE ISSUING A FINAL NEGATIVE REPORT   Report Status PENDING   Incomplete   CULTURE, ROUTINE-ABSCESS     Status: Normal   Collection Time   01/28/12  7:16 PM      Component Value Range Status  Comment   Specimen Description HAND   Final    Special Requests NONE   Final    Gram Stain     Final    Value: RARE WBC PRESENT, PREDOMINANTLY PMN     NO SQUAMOUS EPITHELIAL CELLS SEEN     NO ORGANISMS SEEN   Culture NO GROWTH 3 DAYS   Final    Report Status 02/01/2012 FINAL   Final   ANAEROBIC CULTURE     Status: Normal (Preliminary result)   Collection Time   01/28/12  7:16 PM      Component Value Range Status Comment   Specimen Description HAND   Final    Special Requests NONE   Final    Gram Stain PENDING   Incomplete    Culture     Final    Value: NO ANAEROBES ISOLATED; CULTURE IN PROGRESS FOR 5 DAYS   Report Status PENDING   Incomplete   FUNGUS CULTURE W SMEAR     Status: Normal (Preliminary result)   Collection Time   01/28/12  7:16 PM      Component Value Range Status Comment   Specimen Description HAND   Final    Special Requests NONE   Final    Fungal Smear NO YEAST OR FUNGAL ELEMENTS SEEN   Final    Culture CULTURE IN PROGRESS FOR FOUR WEEKS   Final    Report  Status PENDING   Incomplete   CULTURE, ROUTINE-ABSCESS     Status: Normal   Collection Time   01/28/12  7:20 PM      Component Value Range Status Comment   Specimen Description HAND   Final    Special Requests NONE   Final    Gram Stain     Final    Value: RARE WBC PRESENT,BOTH PMN AND MONONUCLEAR     NO SQUAMOUS EPITHELIAL CELLS SEEN     NO ORGANISMS SEEN   Culture NO GROWTH 3 DAYS   Final    Report Status 02/01/2012 FINAL   Final   ANAEROBIC CULTURE     Status: Normal (Preliminary result)   Collection Time   01/28/12  7:20 PM      Component Value Range Status Comment   Specimen Description HAND   Final    Special Requests NONE   Final    Gram Stain     Final    Value: RARE WBC PRESENT,BOTH PMN AND MONONUCLEAR     NO SQUAMOUS EPITHELIAL CELLS SEEN     NO ORGANISMS SEEN   Culture     Final    Value: NO ANAEROBES ISOLATED; CULTURE IN PROGRESS FOR 5 DAYS   Report Status PENDING   Incomplete   FUNGUS CULTURE W SMEAR     Status: Normal (Preliminary result)   Collection Time   01/28/12  7:20 PM      Component Value Range Status Comment   Specimen Description HAND   Final    Special Requests NONE   Final    Fungal Smear NO YEAST OR FUNGAL ELEMENTS SEEN   Final    Culture CULTURE IN PROGRESS FOR FOUR WEEKS   Final    Report Status PENDING   Incomplete   CULTURE, ROUTINE-ABSCESS     Status: Normal   Collection Time   01/28/12  7:22 PM      Component Value Range Status Comment   Specimen Description WRIST   Final    Special Requests NONE   Final  Gram Stain     Final    Value: RARE WBC PRESENT, PREDOMINANTLY PMN     NO SQUAMOUS EPITHELIAL CELLS SEEN     NO ORGANISMS SEEN   Culture NO GROWTH 3 DAYS   Final    Report Status 02/01/2012 FINAL   Final   ANAEROBIC CULTURE     Status: Normal (Preliminary result)   Collection Time   01/28/12  7:22 PM      Component Value Range Status Comment   Specimen Description WRIST   Final    Special Requests NONE   Final    Gram Stain PENDING    Incomplete    Culture     Final    Value: NO ANAEROBES ISOLATED; CULTURE IN PROGRESS FOR 5 DAYS   Report Status PENDING   Incomplete   FUNGUS CULTURE W SMEAR     Status: Normal (Preliminary result)   Collection Time   01/28/12  7:22 PM      Component Value Range Status Comment   Specimen Description WRIST   Final    Special Requests NONE   Final    Fungal Smear NO YEAST OR FUNGAL ELEMENTS SEEN   Final    Culture CULTURE IN PROGRESS FOR FOUR WEEKS   Final    Report Status PENDING   Incomplete   MRSA PCR SCREENING     Status: Normal   Collection Time   02/01/12  7:56 AM      Component Value Range Status Comment   MRSA by PCR NEGATIVE  NEGATIVE  Final   ANAEROBIC CULTURE     Status: Normal (Preliminary result)   Collection Time   02/01/12  5:30 PM      Component Value Range Status Comment   Specimen Description WRIST LEFT   Final    Special Requests NONE   Final    Gram Stain     Final    Value: FEW WBC PRESENT,BOTH PMN AND MONONUCLEAR     NO SQUAMOUS EPITHELIAL CELLS SEEN     NO ORGANISMS SEEN   Culture PENDING   Incomplete    Report Status PENDING   Incomplete   WOUND CULTURE     Status: Normal (Preliminary result)   Collection Time   02/01/12  5:30 PM      Component Value Range Status Comment   Specimen Description WRIST LEFT   Final    Special Requests NONE   Final    Gram Stain     Final    Value: NO WBC SEEN     NO SQUAMOUS EPITHELIAL CELLS SEEN     NO ORGANISMS SEEN   Culture NO GROWTH   Final    Report Status PENDING   Incomplete     Studies/Results: No results found.  Medications: I have reviewed the patient's current medications.  Assessment and plan: 1.Cellulitis and abscess of hand: -Patient on Vancomycin and Imipenem. S/p I & D x 3 (2.02.2013, 2.03.2013, 2.05.2013-with closure)cultures results pending. -Low grade fevers, but temps continuing to trend down, also WBC trending down -Continue pain management. -Appreciate orthopedic assistance.  2. Diabetes  mellitus -BG controlled continue the Lantus with SSI.  3. HTN (hypertension) -continue norvasc.   4. ARF (acute renal failure) -Follow and recheck in a.m., continue to hold ACE.  5. Hyponatremia Follow and recheck in the a.m.  6. Normocytic anemia -Hbg stable at 9.2.  -iron <10, ferritin high as expected. On Iron sulfate will need evaluation as an outpatient.  7.Sinusitis  on broad spectrum antibiotics.    LOS: 5 days   Sheila Oats M.D. Pager: [phone] [facility] 02/02/2012, 10:42 AM

## 2012-02-02 NOTE — Progress Notes (Signed)
Patient ID: [name], male   DOB: 11-Apr-1951, 61 y.o.   MRN: [id number] .Marland Kitchen Subjective: The patient is doing well this afternoon, more sore than yesterday. Denies nausea, vomiting, fevers, chills, sob.    Objective: Vital signs in last 24 hours: Temp:  [98.3 F (36.8 C)-100 F (37.8 C)] 99 F (37.2 C) (02/06 1354) Pulse Rate:  [68-96] 88  (02/06 1354) Resp:  [14-20] 19  (02/06 1354) BP: (152-194)/(70-100) 161/76 mmHg (02/06 1354) SpO2:  [95 %-100 %] 100 % (02/06 1354) FiO2 (%):  [3 %] 3 % (02/05 2033)  Intake/Output from previous day: 02/05 0701 - 02/06 0700 In: 960 [I.V.:960] Out: 2000 [Urine:1975; Blood:25] Intake/Output this shift:     Basename 02/01/12 0322  HGB 8.8*    Basename 02/01/12 0322  WBC 13.4*  RBC 3.22*  HCT 26.4*  PLT 368    Basename 01/31/12 0325  NA 131*  K 3.7  CL 101  CO2 21  BUN 20  CREATININE 1.72*  GLUCOSE 147*  CALCIUM 8.3*   Culture: No growth to date, Final.  ..The patient is alert and oriented in no acute distress the patient complains of pain in the affected upper extremity. The patient is noted to have a normal HEENT exam. Lung fields show equal chest expansion and no shortness of breath abdomen exam is nontender without distention. Lower extremity examination does not show any fracture dislocation or blood clot symptoms. Pelvis is stable neck and back are stable and nontender Splint clean and dry, digital rom limited by pain, no edema, no signs of ascending cellulitis  Assessment/Plan:  Diabetes mellitus  Cellulitis and abscess of hand  HTN (hypertension)  ARF (acute renal failure)  Hyponatremia  Normocytic anemia  Sinusitis   Plan for dressing change and wound check tomorrow, possible discharge tomorrow vs Friday. Plan to transition to PO ABX with broad spectrum coverage given diabetic status.  [name] L 02/02/2012, 7:14 PM

## 2012-02-02 NOTE — Op Note (Signed)
NAMEGEFFREY, KNOBLOCK NO.:  [id number]  MEDICAL RECORD NO.:  [id number]  LOCATION:  66                         FACILITY:  [facility]  PHYSICIAN:  Satira Anis. Klinton Candelas, M.D.DATE OF BIRTH:  [birth year]  DATE OF PROCEDURE: DATE OF DISCHARGE:                              OPERATIVE REPORT   PREOPERATIVE DIAGNOSES: 1. Status post severe sepsis, left wrist joint, left extensor and     flexor tendons about the forearm, wrist, and hand. 2. Deep abscess, wrist and hand, left upper extremity.  POSTOPERATIVE DIAGNOSES: 1. Status post severe sepsis, left wrist joint, left extensor and     flexor tendons about the forearm, wrist, and hand. 2. Deep abscess, wrist and hand, left upper extremity.  PROCEDURES: 1. Irrigation and debridement, radiocarpal, midcarpal, and CMC joint,     left wrist with arthrotomy and synovectomy, this was an excisional     debridement. 2. Radical extensor tendon tenosynovectomy, left wrist, including 2nd     through 4th dorsal compartments. 3. Radical flexor tendon tenosynovectomy, wrist and forearm about the     FDP, FDS, and FPL tendons. 4. Neurolysis median nerve, left wrist, and hand. 5. Flexor digitorum profundus and superficialis tenosynovectomy about     the middle ring and small finger flexors of the left hand. 6. Complex closure, left wrist dorsally and volarly about the wrist     and forearm. 7. Loose closure mid palm incision, left hand.  SURGEON:  Satira Anis. Amedeo Plenty, M.D.  ASSISTANT:  None.  COMPLICATION:  None.  ANESTHESIA:  General.  TOURNIQUET TIME:  Zero.  INDICATIONS FOR PROCEDURE:  This patient presents for his 3rd washout. He understands risks and benefits of surgery and desires to proceed the above-mentioned operative intervention.  I have discussed with him the relevant issues, do's and don'ts, etc.,  and all questions have been encouraged and answered.  OPERATIVE PROCEDURE:  The patient was seen by myself and  Anesthesia, taken to the operative suite, underwent a smooth induction of general anesthesia, he was laid supine, and properly padded, prepped, and draped in a sterile fashion, Betadine scrub and paint.  His Penrose drains were removed.  Following this, I then performed a sterile prep and drape. Once sterile prep and drape were accomplished, we then performed a look at the volar aspect of the arm.  The patient underwent a look the carpal canal and forearm regions first, this incision of course was left wide open previously.  I performed a radical flexor tendon tenosynovectomy. A  Penrose drain was used to gather tendons and remove thickened tenosynovium from the FDP and FDS tendons as well as the FPL, this was a radical tenosynovectomy accomplished with scissor tip, curette, and knife blade without difficulty.  Once this done, I then performed very careful and cautious neurolysis of the median nerve suite, this out of harm's way, of course, and make sure was intact without complicating feature was and all looked quite well, I was pleased with this.  Following this, I then performed a tenosynovectomy about the ring, middle, and small finger flexor digitorum profundus and flexor digitorum superficialis tendons.  This was a FDP, FDS tenosynovectomy about the fingers and palm  through a separate incision.  This was a distinct in separate portion of the procedure and was accomplished without difficulty.  The excisional technique was used with curette, scissor tip, and blade to removing the nonviable tissue or prenecrotic tissue.  Following this, I then turned attention towards the dorsal wrist joint. A dorsal wrist joint underwent arthrotomy and synovectomy of the radiocarpal, midcarpal, and CMC joints.  I performed a distinct and separate removal of devitalized prenecrotic tissue, and a synovectomy arthrotomy was accomplished.  The patient required this and all friable tissue or nonviable  tissue was removed.  Following this, I performed a radical tenosynovectomy of the extensor apparatus involving the ECRB, ECRL, 4th dorsal compartment (the EDC, EIP, and EDM tendons.  Also, I performed tenosynovectomy of the EPL.  At this time, I then placed 6 L of fluid through the wrist joint.  I did all this with tourniquet deflated, wound conditions look stable for a closure.  Following this, I performed a complex closure of the volar forearm and wrist with 3-0 Prolene far-near-near-far and interrupted sutures were used.  Following this, I then performed loose closure of the dorsal wound about the wrist.  The mid palm was closed loosely and the distal dorsal hand was left open.  Two Penrose drains, one on top, one on bottom were placed and the patient's 4 separate incisions had Penrose emanating from all of the incisions.  He tolerated this well, had good refill.  The wound looked stable for moving him on to a regime of closure.  The patient tolerated the procedure quite well without difficulty and there were no complicating features.  We will monitor his condition closely.  I did take cultures during the operative procedure.  His initial cultures have not grown anything today, which was unusual given the fact that this was a very septic wrist, this man's hand has a horrible infection in my estimation.  I have discussed this with the lab, we will await anaerobic cultures as the aerobic cultures have been negative so far.  I did send him mycobacterial cultures and we will wait these as well.  I have discussed with the patient all issues, do's and don'ts etc., and should any problems arise, I will be notified immediately.  We will continue to try to work very aggressively to bring this mentor quiescent state of affairs.     Satira Anis. Amedeo Plenty, M.D.     [facility]  D:  02/01/2012  T:  02/02/2012  Job:  [id number]

## 2012-02-02 NOTE — Progress Notes (Signed)
Pt. Resting. In no apparent distress. [place] Student NCAT/Arlinda Pricilla Handler

## 2012-02-03 LAB — DIFFERENTIAL
Basophils Absolute: 0 10*3/uL (ref 0.0–0.1)
Eosinophils Absolute: 0.3 10*3/uL (ref 0.0–0.7)
Lymphocytes Relative: 13 % (ref 12–46)
Monocytes Relative: 10 % (ref 3–12)
Neutrophils Relative %: 74 % (ref 43–77)

## 2012-02-03 LAB — CBC
MCH: 26.9 pg (ref 26.0–34.0)
Platelets: 424 10*3/uL — ABNORMAL HIGH (ref 150–400)
RBC: 2.94 MIL/uL — ABNORMAL LOW (ref 4.22–5.81)

## 2012-02-03 LAB — CULTURE, BLOOD (ROUTINE X 2)
Culture  Setup Time: 201302011937
Culture  Setup Time: 201302011937
Culture: NO GROWTH
Culture: NO GROWTH

## 2012-02-03 LAB — BASIC METABOLIC PANEL
CO2: 18 mEq/L — ABNORMAL LOW (ref 19–32)
Calcium: 8.2 mg/dL — ABNORMAL LOW (ref 8.4–10.5)
GFR calc non Af Amer: 42 mL/min — ABNORMAL LOW (ref >90)
Potassium: 3.8 mEq/L (ref 3.5–5.1)
Sodium: 135 mEq/L (ref 135–145)

## 2012-02-03 MED ORDER — CLONIDINE HCL 0.2 MG PO TABS
0.2000 mg | ORAL_TABLET | Freq: Once | ORAL | Status: AC
  Administered 2012-02-03: 0.2 mg via ORAL
  Filled 2012-02-03 (×2): qty 1

## 2012-02-03 NOTE — Progress Notes (Signed)
[name] ID: OTHO RODELA, male   DOB: [birth year], 61 y.o.   MRN: [id number] .[name] Subjective: The [name] is doing very well this afternoon. He has had significant improvement of his overall pain about the hand. He is tolerating a regular diet at this juncture. He denies any nausea vomiting fevers or chills. He states that the pain in his hand has dramatically improved.  Objective: Vital signs in last 24 hours: Temp:  [98.1 F (36.7 C)-99.2 F (37.3 C)] 99 F (37.2 C) (02/07 1404) Pulse Rate:  [80-105] 90  (02/07 1404) Resp:  [16-20] 20  (02/07 1404) BP: (150-183)/(70-87) 183/87 mmHg (02/07 1404) SpO2:  [96 %-100 %] 96 % (02/07 1404)  Intake/Output from previous day: 02/06 0701 - 02/07 0700 In: 1632.3 [P.O.:480; I.V.:1052.3; IV Piggyback:100] Out: 650 [Urine:650] Intake/Output this shift:     Basename 02/03/12 0324 02/01/12 0322  HGB 7.9* 8.8*    Basename 02/03/12 0324 02/01/12 0322  WBC 11.5* 13.4*  RBC 2.94* 3.22*  HCT 24.3* 26.4*  PLT 424* 368    Basename 02/03/12 0324  NA 135  K 3.8  CL 107  CO2 18*  BUN 21  CREATININE 1.69*  GLUCOSE 86  CALCIUM 8.2*   No results found for this basename: LABPT:2,INR:2 in the last 72 hours  .[name] KitchenPatient presents for evaluation and treatment of the of their upper extremity predicament.Evaluation of the left upper extremity shows that his splint is clean dry and intact. The dressings are removed. His skin parameters have improved drastically he has mild swelling about the wrist and palmar aspect however there are no signs of deep abscess reaccumulation of the purulence. The drains were pulled without difficulties there is no purulent drainage present. He has mild serous drainage. Neurovascularly he is intact. I have demonstrated to him diligent active range of motion and passive range of motion massage and elevation of the digits. Wet-to-dry dressings were placed on the wounds followed by bulky soft dressings and a volar splint. The  [name] denies neck back chest or of abdominal pain. The [name] notes that they have no lower extremity problems. The [name] primarily complains of the upper extremity pain noted.   Assessment/Plan: Diabetes mellitus Cellulitis and abscess of hand HTN (hypertension) ARF (acute renal failure) Hyponatremia Normocytic anemia Sinusitis Status post multiple I&D's about the left hand both the dorsal and volar aspects include aggressive extensor tenosynovectomy as well as flexor tenosynovectomy and carpal tunnel release  I have discussed with the [name] at length today, greater than 1 hours duration, his upper extremity predicament.  At this juncture given he is significantly improved in terms of his wound status and no obvious reaccumulation of infection or deep abscess is present we will plan for discharge tomorrow if cleared by medicine. I have discussed with him and demonstrated to him at length how to perform daily dressing changes to the wound. He will perform wet-to-dry dressing changes daily, he can take his dressings off for shower, he will then reapply a wet-to-dry dressing followed by a volar splint and Ace wrap as instructed. He will diligently work on active flexion and extension of the digits as well as passive range of motion and massage and improve his motion and swelling. Dressing supplies have been provided for the [name] for the weekend changes. We recommended the [name] be discharged home on Augmentin 875 mg 1 pill twice a day for 14 days, in addition oxycodone for pain and Robaxin for muscle spasm agent. The [name] was followed up at  [name] to see [name] this Monday morning at 8 am( in 4 days). The [name] will call [phone] for any questions or concerns. Should he have problems over the weekend he may call to [phone] or [phone]. [name].We recommend that you to take vitamin C 1000 mg a day to promote healing we also recommend that if you require her pain medicine that  he take a stool softener to prevent constipation as most pain medicines will have constipation side effects. We recommend either Peri-Colace or Senokot and recommend that you also consider adding MiraLAX to prevent the constipation affects from pain medicine if you are required to use them. These medicines are over the counter and maybe purchased at a local pharmacy.     Caralynn Gelber L 02/03/2012, 3:06 PM

## 2012-02-03 NOTE — Progress Notes (Signed)
Subjective:  States left hand pain much better today, asking appropriate questions regarding dressing changes when he goes home. Denies melena, also denies hematochezia.   Antibiotics: Vanc: 2.1.2013>> Imipenem: 2.1.2013>>  CUltures: -Anaerobic 2.01.2013:  Negative till date. -Fungal culture 2.01.2013: Negative till date. -Routine culture 2.01.2013: Negative till date. Objective: Filed Vitals:   02/03/12 0838 02/03/12 1158 02/03/12 1404 02/03/12 1709  BP: 164/73 167/85 183/87 166/76  Pulse: 90 105 90 93  Temp: 98.1 F (36.7 C) 98.7 F (37.1 C) 99 F (37.2 C)   TempSrc: Oral Oral Oral   Resp: 20 18 20 20   Height:      Weight:      SpO2: 97% 99% 96% 99%   Weight change:   Intake/Output Summary (Last 24 hours) at 02/03/12 1743 Last data filed at 02/03/12 1700  Gross per 24 hour  Intake   1642 ml  Output    650 ml  Net    992 ml    General: Alert, awake, oriented x3, in no acute distress.  HEENT: No bruits, no goiter.  Heart: Regular rate and rhythm, without murmurs, rubs, gallops.  Lungs: good air movement CTA B/L. Abdomen: Soft, nontender, nondistended, positive bowel sounds.  Ext: L. With and dressing clean and dry, elevated.able to move finger of left hand. But there is still pain with passive movement. Good vascular filling.  No lower extremity edema Neuro: Alert and oriented x3 cranial nerves 2-12 grossly intact. Lab Results:  [facility] 02/03/12 0324  NA 135  K 3.8  CL 107  CO2 18*  GLUCOSE 86  BUN 21  CREATININE 1.69*  CALCIUM 8.2*  MG --  PHOS --   No results found for this basename: AST:2,ALT:2,ALKPHOS:2,BILITOT:2,PROT:2,ALBUMIN:2 in the last 72 hours  Basename 02/03/12 0324 02/01/12 0322  WBC 11.5* 13.4*  NEUTROABS 8.5* --  HGB 7.9* 8.8*  HCT 24.3* 26.4*  MCV 82.7 82.0  PLT 424* 368   No results found for this basename: HGBA1C:2 in the last 72 hours Micro Results: Recent Results (from the past 240 hour(s))  CULTURE, BLOOD (ROUTINE X 2)      Status: Normal   Collection Time   01/28/12 11:10 AM      Component Value Range Status Comment   Specimen Description BLOOD RIGHT FOREARM   Final    Special Requests BOTTLES DRAWN AEROBIC AND ANAEROBIC [facility]   Final    Culture  Setup Time [id number]   Final    Culture NO GROWTH 5 DAYS   Final    Report Status 02/03/2012 FINAL   Final   CULTURE, BLOOD (ROUTINE X 2)     Status: Normal   Collection Time   01/28/12 11:45 AM      Component Value Range Status Comment   Specimen Description BLOOD RIGHT HAND   Final    Special Requests BOTTLES DRAWN AEROBIC AND ANAEROBIC 5CC   Final    Culture  Setup Time [id number]   Final    Culture NO GROWTH 5 DAYS   Final    Report Status 02/03/2012 FINAL   Final   CULTURE, ROUTINE-ABSCESS     Status: Normal   Collection Time   01/28/12  7:16 PM      Component Value Range Status Comment   Specimen Description HAND   Final    Special Requests NONE   Final    Gram Stain     Final    Value: RARE WBC PRESENT, PREDOMINANTLY PMN  NO SQUAMOUS EPITHELIAL CELLS SEEN     NO ORGANISMS SEEN   Culture NO GROWTH 3 DAYS   Final    Report Status 02/01/2012 FINAL   Final   ANAEROBIC CULTURE     Status: Normal   Collection Time   01/28/12  7:16 PM      Component Value Range Status Comment   Specimen Description HAND   Final    Special Requests NONE   Final    Gram Stain     Final    Value: RARE WBC PRESENT, PREDOMINANTLY PMN     NO SQUAMOUS EPITHELIAL CELLS SEEN     NO ORGANISMS SEEN   Culture NO ANAEROBES ISOLATED   Final    Report Status 02/02/2012 FINAL   Final   FUNGUS CULTURE W SMEAR     Status: Normal (Preliminary result)   Collection Time   01/28/12  7:16 PM      Component Value Range Status Comment   Specimen Description HAND   Final    Special Requests NONE   Final    Fungal Smear NO YEAST OR FUNGAL ELEMENTS SEEN   Final    Culture CULTURE IN PROGRESS FOR FOUR WEEKS   Final    Report Status PENDING   Incomplete   AFB CULTURE WITH SMEAR      Status: Normal (Preliminary result)   Collection Time   01/28/12  7:16 PM      Component Value Range Status Comment   Specimen Description HAND   Final    Special Requests NONE   Final    ACID FAST SMEAR NO ACID FAST BACILLI SEEN   Final    Culture     Final    Value: CULTURE WILL BE EXAMINED FOR 6 WEEKS BEFORE ISSUING A FINAL REPORT   Report Status PENDING   Incomplete   CULTURE, ROUTINE-ABSCESS     Status: Normal   Collection Time   01/28/12  7:20 PM      Component Value Range Status Comment   Specimen Description HAND   Final    Special Requests NONE   Final    Gram Stain     Final    Value: RARE WBC PRESENT,BOTH PMN AND MONONUCLEAR     NO SQUAMOUS EPITHELIAL CELLS SEEN     NO ORGANISMS SEEN   Culture NO GROWTH 3 DAYS   Final    Report Status 02/01/2012 FINAL   Final   ANAEROBIC CULTURE     Status: Normal   Collection Time   01/28/12  7:20 PM      Component Value Range Status Comment   Specimen Description HAND   Final    Special Requests NONE   Final    Gram Stain     Final    Value: RARE WBC PRESENT,BOTH PMN AND MONONUCLEAR     NO SQUAMOUS EPITHELIAL CELLS SEEN     NO ORGANISMS SEEN   Culture NO ANAEROBES ISOLATED   Final    Report Status 02/02/2012 FINAL   Final   FUNGUS CULTURE W SMEAR     Status: Normal (Preliminary result)   Collection Time   01/28/12  7:20 PM      Component Value Range Status Comment   Specimen Description HAND   Final    Special Requests NONE   Final    Fungal Smear NO YEAST OR FUNGAL ELEMENTS SEEN   Final    Culture CULTURE IN PROGRESS FOR FOUR WEEKS  Final    Report Status PENDING   Incomplete   AFB CULTURE WITH SMEAR     Status: Normal (Preliminary result)   Collection Time   01/28/12  7:20 PM      Component Value Range Status Comment   Specimen Description HAND   Final    Special Requests NONE   Final    ACID FAST SMEAR NO ACID FAST BACILLI SEEN   Final    Culture     Final    Value: CULTURE WILL BE EXAMINED FOR 6 WEEKS BEFORE ISSUING A FINAL  REPORT   Report Status PENDING   Incomplete   CULTURE, ROUTINE-ABSCESS     Status: Normal   Collection Time   01/28/12  7:22 PM      Component Value Range Status Comment   Specimen Description WRIST   Final    Special Requests NONE   Final    Gram Stain     Final    Value: RARE WBC PRESENT, PREDOMINANTLY PMN     NO SQUAMOUS EPITHELIAL CELLS SEEN     NO ORGANISMS SEEN   Culture NO GROWTH 3 DAYS   Final    Report Status 02/01/2012 FINAL   Final   ANAEROBIC CULTURE     Status: Normal   Collection Time   01/28/12  7:22 PM      Component Value Range Status Comment   Specimen Description WRIST   Final    Special Requests NONE   Final    Gram Stain     Final    Value: RARE WBC PRESENT, PREDOMINANTLY PMN     NO SQUAMOUS EPITHELIAL CELLS SEEN     NO ORGANISMS SEEN   Culture NO ANAEROBES ISOLATED   Final    Report Status 02/02/2012 FINAL   Final   FUNGUS CULTURE W SMEAR     Status: Normal (Preliminary result)   Collection Time   01/28/12  7:22 PM      Component Value Range Status Comment   Specimen Description WRIST   Final    Special Requests NONE   Final    Fungal Smear NO YEAST OR FUNGAL ELEMENTS SEEN   Final    Culture CULTURE IN PROGRESS FOR FOUR WEEKS   Final    Report Status PENDING   Incomplete   AFB CULTURE WITH SMEAR     Status: Normal (Preliminary result)   Collection Time   01/28/12  7:22 PM      Component Value Range Status Comment   Specimen Description HAND   Final    Special Requests NONE   Final    ACID FAST SMEAR NO ACID FAST BACILLI SEEN   Final    Culture     Final    Value: CULTURE WILL BE EXAMINED FOR 6 WEEKS BEFORE ISSUING A FINAL REPORT   Report Status PENDING   Incomplete   MRSA PCR SCREENING     Status: Normal   Collection Time   02/01/12  7:56 AM      Component Value Range Status Comment   MRSA by PCR NEGATIVE  NEGATIVE  Final   ANAEROBIC CULTURE     Status: Normal (Preliminary result)   Collection Time   02/01/12  5:30 PM      Component Value Range Status  Comment   Specimen Description WRIST LEFT   Final    Special Requests NONE   Final    Gram Stain     Final  Value: FEW WBC PRESENT,BOTH PMN AND MONONUCLEAR     NO SQUAMOUS EPITHELIAL CELLS SEEN     NO ORGANISMS SEEN   Culture     Final    Value: NO ANAEROBES ISOLATED; CULTURE IN PROGRESS FOR 5 DAYS   Report Status PENDING   Incomplete   WOUND CULTURE     Status: Normal (Preliminary result)   Collection Time   02/01/12  5:30 PM      Component Value Range Status Comment   Specimen Description WRIST LEFT   Final    Special Requests NONE   Final    Gram Stain     Final    Value: NO WBC SEEN     NO SQUAMOUS EPITHELIAL CELLS SEEN     NO ORGANISMS SEEN   Culture NO GROWTH 1 DAY   Final    Report Status PENDING   Incomplete     Studies/Results: No results found.  Medications: I have reviewed the patient's current medications.  Assessment and plan: 1.Cellulitis and abscess of hand: -Patient on Vancomycin and Imipenem. S/p I & D x 3 (2.02.2013, 2.03.2013, 2.05.2013-with closure)cultures results pending. -Patient afebrile, also WBC trending down and clinically better. -Continue pain management. -Appreciate orthopedic assistance, discussed patient with Ortho PA and they will see patient this afternoon and and give recommendations on oral antibiotics upon dischargeand pending appearance of his wound timing of his discharge.  2. Diabetes mellitus -BG controlled continue the Lantus with SSI.  3. HTN (hypertension), uncontrolled -continue norvasc, will add when necessary clonidine and follow.   4. ARF (acute renal failure) -Gradually improving, continue to hold ACE.  5. Hyponatremia resolved.  6. Normocytic anemia Hemoglobin drifting down-asymptomatic with no gross bleeding, continue iron supplementation outpatient followup for further eval.   7.Sinusitis  on broad spectrum antibiotics.    LOS: 6 days   Sheila Oats M.D. Pager: [phone] [facility] 02/03/2012, 5:43  PM

## 2012-02-03 NOTE — Progress Notes (Signed)
Occupational Therapy Treatment Patient Details Name: [name] MRN: [id number] DOB: 12-Jun-1951 Today's Date: 02/03/2012  OT Assessment/Plan OT Assessment/Plan OT Plan: Discharge plan remains appropriate OT Frequency: Min 3X/week Follow Up Recommendations: Outpatient OT Equipment Recommended: None recommended by OT OT Goals Acute Rehab OT Goals Time For Goal Achievement: 2 weeks ADL Goals Pt Will Perform Grooming: with modified independence;Unsupported;Standing at sink ADL Goal: Grooming - Progress: Discontinued (comment) (all adl goals discontinued:: pt doing what he can/wife helps) Pt Will Perform Upper Body Bathing: with min assist;Standing at sink;Unsupported ADL Goal: Upper Body Bathing - Progress: Discontinued (comment) Pt Will Perform Lower Body Bathing: with set-up;with supervision;Unsupported;Other (comment) ADL Goal: Lower Body Bathing - Progress: Discontinued (comment) Pt Will Perform Upper Body Dressing: with set-up;with supervision;Sit to stand from bed;Unsupported;Sit to stand from chair ADL Goal: Upper Body Dressing - Progress: Discontinued (comment) Pt Will Perform Lower Body Dressing: with set-up;with supervision;Sit to stand from bed;Sit to stand from chair;Unsupported ADL Goal: Lower Body Dressing - Progress: Discontinued (comment) Pt Will Transfer to Toilet: with modified independence;Ambulation;Regular height toilet ADL Goal: Toilet Transfer - Progress: Discontinued (comment) (Pt states he has been getting to toilet independently) Pt Will Perform Toileting - Clothing Manipulation: with modified independence;Standing ADL Goal: Toileting - Clothing Manipulation - Progress: Discontinued (comment) Arm Goals Pt Will Perform AROM: Independently;Left upper extremity;1 set;10 reps;Other (comment) Arm Goal: AROM - Progress: Progressing toward goal Additional Arm Goal #2: Pt will instruct caregiver in positioning LUE for edema management Arm Goal: Additional Goal #2 -  Progress: Met  OT Treatment Precautions/Restrictions      ADL ADL Ambulation Related to ADLs: pt states he is independent ADL Comments: wife has been helping with ADLs:  states he can use R to open containers, can lift LUE into sleeve: probably will wear loose tshirts so he doesn't ruin his shirts Mobility    Exercises General Exercises - Upper Extremity Digit Composite Flexion: AROM Composite Extension: AROM Other Exercises Other Exercises: PROM to digits, composite and IP blocked.  Extension about 75% ROM; DIPS tight in flexion Other Exercises: Pt had sx Tues, more digits slightly swollen--esp 3, 4:  he was properly positioined  End of Session OT - End of Session Activity Tolerance: Patient tolerated treatment well Patient left: in bed;with call bell in reach General Behavior During Session: [facility] for tasks performed Cognition: [facility] for tasks performed [facility], OTR/L [id number] 02/03/2012 Kais Monje  02/03/2012, 9:46 AM

## 2012-02-03 NOTE — Progress Notes (Addendum)
Physician informed of elevated B/P(164/73) collected at [phone], and informed RN of reassessment data (167/85) one hour after administraton of 1000 administration of Norvasc 10 mg. Endorsement given to primary nurse with follow up monitoring.  Gilman Buttner, SN [employer] SU/Arlinda Pricilla Handler.

## 2012-02-04 LAB — WOUND CULTURE: Culture: NO GROWTH

## 2012-02-04 LAB — GLUCOSE, CAPILLARY: Glucose-Capillary: 141 mg/dL — ABNORMAL HIGH (ref 70–99)

## 2012-02-04 MED ORDER — AMLODIPINE BESYLATE 10 MG PO TABS: 10.0000 mg | ORAL_TABLET | Freq: Every day | ORAL | Status: DC

## 2012-02-04 MED ORDER — ALPRAZOLAM 0.5 MG PO TABS: 0.5000 mg | ORAL_TABLET | Freq: Two times a day (BID) | ORAL | Status: AC | PRN

## 2012-02-04 MED ORDER — OXYCODONE HCL 5 MG PO TABS: 5.0000 mg | ORAL_TABLET | ORAL | Status: AC | PRN

## 2012-02-04 MED ORDER — ASCORBIC ACID 1000 MG PO TABS: 1000.0000 mg | ORAL_TABLET | Freq: Every day | ORAL | Status: AC

## 2012-02-04 MED ORDER — METHOCARBAMOL 500 MG PO TABS: 500.0000 mg | ORAL_TABLET | Freq: Four times a day (QID) | ORAL | Status: AC | PRN

## 2012-02-04 MED ORDER — DSS 100 MG PO CAPS: 100.0000 mg | ORAL_CAPSULE | Freq: Two times a day (BID) | ORAL | Status: AC

## 2012-02-04 MED ORDER — POLYETHYLENE GLYCOL 3350 17 G PO PACK: 17.0000 g | PACK | Freq: Every day | ORAL | Status: AC | PRN

## 2012-02-04 MED ORDER — FERROUS SULFATE 325 (65 FE) MG PO TABS: 325.0000 mg | ORAL_TABLET | Freq: Two times a day (BID) | ORAL | Status: DC

## 2012-02-04 MED ORDER — SULFAMETHOXAZOLE-TRIMETHOPRIM 800-160 MG PO TABS: 1.0000 | ORAL_TABLET | Freq: Two times a day (BID) | ORAL | Status: AC

## 2012-02-04 MED ORDER — CEPHALEXIN 500 MG PO CAPS: 500.0000 mg | ORAL_CAPSULE | Freq: Four times a day (QID) | ORAL | Status: AC

## 2012-02-04 NOTE — Progress Notes (Signed)
Talked to patient about DCP/  HHC needs for dressing change; patient lives with spouse, independent prior to admission. Patient chose [facility] for [facility]. Patient is concerned about the payment for [facility] because he does not have any insurance. Talked to Levora Dredge RN with [facility] - will talk to patient about financial arrangements. Mindi Slicker RN, BSN, MHA

## 2012-02-04 NOTE — Progress Notes (Signed)
Pt d;c home with wife. Instructions given about wound care and materials sent with patient. Pt signed d'c papers.

## 2012-02-04 NOTE — Progress Notes (Signed)
Occupational Therapy Treatment Patient Details Name: [name] MRN: [id number] DOB: November 11, 1951 Today's Date: 02/04/2012  OT Assessment/Plan OT Assessment/Plan OT Plan: Discharge plan remains appropriate OT Frequency: Min 3X/week Follow Up Recommendations: Outpatient OT Equipment Recommended: None recommended by OT OT Goals Arm Goals Pt Will Perform AROM: Independently;Left upper extremity;1 set;10 reps;Other (comment) Arm Goal: AROM - Progress: Progressing toward goal:  Pt mostly independent, wrote out program to help him organize as he was skipping all around and losing track of what he had done  OT Treatment Precautions/Restrictions      ADL ADL Ambulation Related to ADLs:  (Pt shifted between AROM/PROM/massage haphardly:  helped him organize program ) ADL Comments:  (Pt worked through [employer], composite and IP blocked).  Performed retrograde massage as 3rd and 5th digits with edema Mobility    Exercises Performed AROM, PROM, composite and with IPs blocked.  Performed retrograde massage also.  ROM is improving:  IPs tight in flexion and some PIP tightness in extension, especially 3rd digit.    End of Session   Lesle Chris, OTR/L [id number] 02/04/2012 [name]  02/04/2012, 12:30 PM

## 2012-02-04 NOTE — Discharge Summary (Signed)
Discharge Note  Name: [name] MRN: [id number] DOB: 1951/07/17 61 y.o.  Date of Admission: 01/28/2012 10:32 AM Date of Discharge: 02/04/2012 Attending Physician: Sheila Oats, MD  Discharge Diagnosis: Principal Problem:  *Cellulitis and abscess of hand Active Problems:  Diabetes mellitus  HTN (hypertension)  ARF (acute renal failure)  Hyponatremia  Normocytic anemia  Sinusitis   Discharge Medications: Medication List  As of 02/04/2012 11:48 AM   STOP taking these medications         doxycycline 100 MG tablet      HYDROcodone-acetaminophen 5-500 MG per tablet      lisinopril 20 MG tablet         TAKE these medications         ALPRAZolam 0.5 MG tablet   Commonly known as: XANAX   Take 1 tablet (0.5 mg total) by mouth 2 (two) times daily as needed for anxiety.      amLODipine 10 MG tablet   Commonly known as: NORVASC   Take 1 tablet (10 mg total) by mouth daily.      ascorbic acid 1000 MG tablet   Commonly known as: VITAMIN C   Take 1 tablet (1,000 mg total) by mouth daily.      aspirin 81 MG tablet   Take 81 mg by mouth daily.      cephALEXin 500 MG capsule   Commonly known as: KEFLEX   Take 1 capsule (500 mg total) by mouth 4 (four) times daily.      DSS 100 MG Caps   Take 100 mg by mouth 2 (two) times daily.      ferrous sulfate 325 (65 FE) MG tablet   Take 1 tablet (325 mg total) by mouth 2 (two) times daily.      insulin NPH-insulin regular (70-30) 100 UNIT/ML injection   Commonly known as: NOVOLIN 70/30   Inject 15 Units into the skin 2 (two) times daily with a meal.      methocarbamol 500 MG tablet   Commonly known as: ROBAXIN   Take 1 tablet (500 mg total) by mouth every 6 (six) hours as needed.      oxyCODONE 5 MG immediate release tablet   Commonly known as: Oxy IR/ROXICODONE   Take 1-2 tablets (5-10 mg total) by mouth every 4 (four) hours as needed.      polyethylene glycol packet   Commonly known as: MIRALAX / GLYCOLAX   Take 17  g by mouth daily as needed.      sulfamethoxazole-trimethoprim 800-160 MG per tablet   Commonly known as: BACTRIM DS,SEPTRA DS   Take 1 tablet by mouth 2 (two) times daily.            Disposition and follow-up:   [name] was discharged from [facility] in improved/stable condition.    Follow-up Appointments: Discharge Orders    Future Orders Please Complete By Expires   Diet Carb Modified      Increase activity slowly      Discharge wound care:      Comments:   As directed per orthopedics.   Home Health      Comments:   For dressing changes as directed per orthopedics.   Questions: Responses:   To provide the following care/treatments RN      Consultations:    Procedures Performed:  Dg Eye Foreign Body  01/28/2012  *RADIOLOGY REPORT*  Clinical Data: Metal working/exposure; clearance prior to MRI  ORBITS FOR  FOREIGN BODY - 2 VIEW  Comparison:  None.  Findings:  There is no evidence of metallic foreign body within the orbits.  No significant bone abnormality identified.  Opacification of the left maxillary sinus and bilateral ethmoid sinuses is seen, consistent with sinusitis.  IMPRESSION: 1.  No evidence of metallic foreign body within the orbits. 2.  Left maxillary and bilateral ethmoid sinus disease incidentally noted.  Original Report Authenticated By: Marlaine Hind, M.D.   Dg Chest 2 View  01/28/2012  *RADIOLOGY REPORT*  Clinical Data: Preoperative exam for left arm surgery.  Diabetes and hypertension.  CHEST - 2 VIEW  Comparison: 08/12/2010  Findings: The heart size is stable and at the upper limits of normal.  Both lungs are clear.  No evidence of pleural effusion. No mass or lymphadenopathy identified.  Mild thoracic dextroscoliosis remains stable.  IMPRESSION: No active cardiopulmonary disease.  Original Report Authenticated By: Marlaine Hind, M.D.   [name]  01/28/2012  *RADIOLOGY REPORT*  Clinical Data: Diffuse hand pain,  swelling and erythema.  Possible insect bite 5 days ago.  Question infection.  MRI OF THE LEFT HAND WITHOUT CONTRAST  Technique:  Multiplanar, multisequence MR imaging was performed. No intravenous contrast was administered.  Comparison: Radiographs 01/28/2012.  Findings: There is severe diffuse soft tissue swelling and edema throughout the hand.  Ill-defined fluid is present dorsally, measuring up to 1.8 cm in thickness.  This primarily lies superficial to the extensor tendons.  Proximal extent at the wrist is not imaged.  Distally, this extends into the proximal digits. Contrast was not administered, limiting assessment for focal fluid collections.  There is moderate fluid within the flexor and extensor tendon sheaths.  The wrist is incompletely imaged.  There is prominent fluid within the midcarpal compartment, deep to the extensor tendons. There is diffuse edema throughout the intrinsic hand muscles.  There is no evidence of bone destruction within the metacarpals, phalanges or distal carpal row.  All of the fingers are flexed, somewhat limiting distal phalangeal assessment.  IMPRESSION:  1.  Markedly abnormal study with extensive soft tissue edema and ill-defined fluid throughout the hand, especially dorsally. Findings are highly concerning for cellulitis, myofasciitis and associated tenosynovitis.  No discrete abscess is seen without contrast. 2.  The wrist is incompletely imaged.  Prominent fluid in the mid carpal compartment could reflect a reactive effusion although intra- articular infection cannot be excluded. 3.  No evidence of osteomyelitis.  These results were called by telephone on 01/28/2012  at  1440 hours to  Dr. Wilson Singer, who verbally acknowledged these results.  Original Report Authenticated By: Vivia Ewing, M.D.   Dg Hand Complete Left  01/28/2012  *RADIOLOGY REPORT*  Clinical Data: Hand pain and swelling.  Insect bite/envenomation.  LEFT HAND - COMPLETE 3+ VIEW  Comparison: None.   Findings: Marked soft tissue swelling is present involving the entire hand.  No destructive osseous lesions are identified. Fingers are flexed.  No radiopaque foreign body is identified. Mild enthesopathy is present involving the dorsal ulnar aspect of the proximal phalanx of the small finger.  IMPRESSION: Marked soft tissue swelling of the hand without acute osseous abnormality.  Original Report Authenticated By: Dereck Ligas, M.D.    Admission HPI [name] is an 61 y.o. male with a PMH of DM who presented to the hospital with a 4 day history of worsening right hand swelling. The patient saw his PCP 3 days ago and was put on doxycycline  and despite the antibiotics, the swelling has worsened and has become increasingly painful. No obvious injury or bites, but felt a "sting" on the day he began to have symptoms. He also has had fever and chills. He has already been evaluated by Dr. Roseanne Kaufman who plans to take him to surgery for an I&D of left hand abscess/cellulitis. We are asked to admit due to his underlying medical problems including DM, HTN and acute renal insufficiency. Takes daily ASA.   Physical exam General: Alert, awake, oriented x3, in no acute distress.  HEENT: No bruits, no goiter.  Heart: Regular rate and rhythm, without murmurs, rubs, gallops.  Lungs: good air movement CTA B/L.  Abdomen: Soft, nontender, nondistended, positive bowel sounds.  Ext: L. With and dressing clean and dry, elevated.able to move finger of left hand. But there is still pain with passive movement. Good vascular filling.  No lower extremity edema  Neuro: Alert and oriented x3 cranial nerves 2-12 grossly intact  Hospital Course by problem list: Principal Problem:  *Cellulitis and abscess of hand Active Problems:  Diabetes mellitus  HTN (hypertension)  ARF (acute renal failure)  Hyponatremia  Normocytic anemia  Sinusitis  1.Cellulitis and abscess of hand:  Upon admission blood cultures were  obtained and the patient was placed on broad-spectrum antibiotics with vancomycin and imipenem. Orthopedics was consulted and followed patient and they did I&D x3 on 2.02.2013, 2.03.2013, 2.05.2013, and on to 5 and 2013 he had closure of the wound following the I&D. All cultures blood and woundf from the debridements came back negative. The patient's leukocytosis gradually improved and is last white cell count 11.5 prior to discharge. He has been afebrile for greater than 48 hours and remained hemodynamically stable. His pain was managed with narcotics. The Orthopedics team followed up with patient on 2/7 and per their note of 2/7 discussed nd demonstrated to him at length how to perform daily dressing changes to the wound. He will perform wet-to-dry dressing changes daily, he can take his dressings off for shower, he will then reapply a wet-to-dry dressing followed by a volar splint and Ace wrap as instructed.  He will diligently work on active flexion and extension of the digits as well as passive range of motion and massage and improve his motion and swelling. Dressing supplies have been provided for the patient for the weekend changes.  Also recommended arm Augmentin for 14 days and that it was noted that he is allergic to penicillin and so they changed it to Bactrim DS and Keflex a day of for 2 weeks. in addition oxycodone for pain and Robaxin for muscle spasm .  The patient was followed up at [place] to see Dr. Amedeo Plenty this Monday morning at 8 am( in 4 days). The patient will call [phone] for any questions or concerns. Should he have problems over the weekend he may call to [phone] or [phone].  Marland Kitchen they also recommended that he takes vitamin C 1000 mg a day to promote healing, also recommended  Peri-Colace or Senokot and recommended for constipation to consider adding MiraLAX if needed to further prevent the constipation affects from pain medicine if he required them. Home health has also  been set up for per patient's request are to assist initially with his dressing changes.  2. Diabetes mellitus  He was placed on Lantus with sliding scale coverage during this hospital stay. His to continue his  70/30 upon discharge and followup with his primary care physician.  3. HTN (  hypertension), uncontrolled  Patient had been on lisinopril prior to admission, but his creatinine was elevated at 2.03 when he was admitted and so the lisinopril was at [facility] and he was placed on Norvasc for blood pressure control which is to continue upon discharge..  4. ARF (acute renal failure)  As discussed above, his arm creatinine was elevated at 2.03 on admission and his lisinopril was held, he was gently hydrated and his creatinine today prior to discharge is 1.69. His to followup with his primary care physician for continued monitoring of his renal function and referral to nephrology outpatient when clinically appropriate. 5. Hyponatremia   the impression was that it was secondary to volume depletion and resolved with hydration..  6. Normocytic anemia  The patient had no gross evidence of bleeding in the hospital, his last hemoglobin prior to discharge or some 7.9 and he was asymptomatic. He was maintained on supplemental iron and his to followup with his primary care physician for further outpatient evaluation.  7.Sinusitis   treated with the antibiotics as above.  Discharge Vitals:  BP 168/87  Pulse 79  Temp(Src) 99.4 F (37.4 C) (Oral)  Resp 18  Ht 6\' 2"  (1.88 m)  Wt 102.059 kg (225 lb)  BMI 28.89 kg/m2  SpO2 100%  Discharge Labs:  Results for orders placed during the hospital encounter of 01/28/12 (from the past 24 hour(s))  GLUCOSE, CAPILLARY     Status: Abnormal   Collection Time   02/03/12  4:33 PM      Component Value Range   Glucose-Capillary 130 (*) 70 - 99 (mg/dL)   Comment 1 Notify RN    GLUCOSE, CAPILLARY     Status: Abnormal   Collection Time   02/03/12  9:49 PM      Component  Value Range   Glucose-Capillary 141 (*) 70 - 99 (mg/dL)  GLUCOSE, CAPILLARY     Status: Abnormal   Collection Time   02/04/12  8:51 AM      Component Value Range   Glucose-Capillary 114 (*) 70 - 99 (mg/dL)   Comment 1 Notify RN      SignedSheila Oats 02/04/2012, 11:48 AM

## 2012-02-06 LAB — ANAEROBIC CULTURE

## 2012-02-08 ENCOUNTER — Ambulatory Visit: Payer: Self-pay | Attending: Orthopedic Surgery | Admitting: *Deleted

## 2012-02-08 DIAGNOSIS — IMO0001 Reserved for inherently not codable concepts without codable children: Secondary | ICD-10-CM | POA: Insufficient documentation

## 2012-02-08 DIAGNOSIS — R279 Unspecified lack of coordination: Secondary | ICD-10-CM | POA: Insufficient documentation

## 2012-02-08 DIAGNOSIS — M2560 Stiffness of unspecified joint, not elsewhere classified: Secondary | ICD-10-CM | POA: Insufficient documentation

## 2012-02-08 DIAGNOSIS — M2550 Pain in unspecified joint: Secondary | ICD-10-CM | POA: Insufficient documentation

## 2012-02-08 DIAGNOSIS — M25649 Stiffness of unspecified hand, not elsewhere classified: Secondary | ICD-10-CM | POA: Insufficient documentation

## 2012-02-16 ENCOUNTER — Ambulatory Visit: Payer: Self-pay | Admitting: Occupational Therapy

## 2012-02-17 ENCOUNTER — Ambulatory Visit: Payer: Self-pay | Admitting: Occupational Therapy

## 2012-02-18 ENCOUNTER — Encounter: Payer: Self-pay | Admitting: Occupational Therapy

## 2012-02-21 ENCOUNTER — Ambulatory Visit: Payer: Self-pay | Admitting: Occupational Therapy

## 2012-02-23 ENCOUNTER — Encounter: Payer: Self-pay | Admitting: Occupational Therapy

## 2012-02-24 ENCOUNTER — Encounter (HOSPITAL_COMMUNITY): Payer: Self-pay | Admitting: Orthopedic Surgery

## 2012-02-24 ENCOUNTER — Ambulatory Visit: Payer: Self-pay | Admitting: Occupational Therapy

## 2012-02-28 ENCOUNTER — Encounter: Payer: Self-pay | Admitting: Occupational Therapy

## 2012-03-01 ENCOUNTER — Encounter: Payer: Self-pay | Admitting: Occupational Therapy

## 2012-03-03 ENCOUNTER — Encounter: Payer: Self-pay | Admitting: Occupational Therapy

## 2012-03-03 LAB — FUNGUS CULTURE W SMEAR
Fungal Smear: NONE SEEN
Fungal Smear: NONE SEEN

## 2012-03-06 ENCOUNTER — Ambulatory Visit: Payer: Self-pay | Attending: Orthopedic Surgery | Admitting: Occupational Therapy

## 2012-03-06 DIAGNOSIS — IMO0001 Reserved for inherently not codable concepts without codable children: Secondary | ICD-10-CM | POA: Insufficient documentation

## 2012-03-06 DIAGNOSIS — M2560 Stiffness of unspecified joint, not elsewhere classified: Secondary | ICD-10-CM | POA: Insufficient documentation

## 2012-03-06 DIAGNOSIS — R279 Unspecified lack of coordination: Secondary | ICD-10-CM | POA: Insufficient documentation

## 2012-03-06 DIAGNOSIS — M2550 Pain in unspecified joint: Secondary | ICD-10-CM | POA: Insufficient documentation

## 2012-03-06 DIAGNOSIS — M25649 Stiffness of unspecified hand, not elsewhere classified: Secondary | ICD-10-CM | POA: Insufficient documentation

## 2012-03-08 ENCOUNTER — Encounter: Payer: Self-pay | Admitting: Occupational Therapy

## 2012-03-10 ENCOUNTER — Ambulatory Visit: Payer: Self-pay | Admitting: Occupational Therapy

## 2012-03-13 ENCOUNTER — Ambulatory Visit: Payer: Self-pay | Admitting: Occupational Therapy

## 2012-03-15 ENCOUNTER — Encounter: Payer: Self-pay | Admitting: Occupational Therapy

## 2012-03-17 ENCOUNTER — Ambulatory Visit: Payer: Self-pay | Admitting: Occupational Therapy

## 2012-03-17 LAB — AFB CULTURE WITH SMEAR (NOT AT ARMC): Acid Fast Smear: NONE SEEN

## 2012-03-20 ENCOUNTER — Ambulatory Visit: Payer: Self-pay | Admitting: Occupational Therapy

## 2012-03-22 ENCOUNTER — Encounter: Payer: Self-pay | Admitting: Occupational Therapy

## 2012-03-23 ENCOUNTER — Encounter: Payer: Self-pay | Admitting: Occupational Therapy

## 2012-03-27 ENCOUNTER — Ambulatory Visit: Payer: Self-pay | Attending: Orthopedic Surgery | Admitting: Occupational Therapy

## 2012-03-27 DIAGNOSIS — M2560 Stiffness of unspecified joint, not elsewhere classified: Secondary | ICD-10-CM | POA: Insufficient documentation

## 2012-03-27 DIAGNOSIS — R279 Unspecified lack of coordination: Secondary | ICD-10-CM | POA: Insufficient documentation

## 2012-03-27 DIAGNOSIS — IMO0001 Reserved for inherently not codable concepts without codable children: Secondary | ICD-10-CM | POA: Insufficient documentation

## 2012-03-27 DIAGNOSIS — M2550 Pain in unspecified joint: Secondary | ICD-10-CM | POA: Insufficient documentation

## 2012-03-27 DIAGNOSIS — M25649 Stiffness of unspecified hand, not elsewhere classified: Secondary | ICD-10-CM | POA: Insufficient documentation

## 2012-03-29 ENCOUNTER — Encounter: Payer: Self-pay | Admitting: Occupational Therapy

## 2012-03-31 ENCOUNTER — Encounter: Payer: Self-pay | Admitting: Occupational Therapy

## 2012-04-03 ENCOUNTER — Ambulatory Visit: Payer: Self-pay | Admitting: Occupational Therapy

## 2012-04-05 ENCOUNTER — Encounter: Payer: Self-pay | Admitting: Occupational Therapy

## 2012-04-07 ENCOUNTER — Ambulatory Visit: Payer: Self-pay | Admitting: Occupational Therapy

## 2014-04-18 DIAGNOSIS — E11311 Type 2 diabetes mellitus with unspecified diabetic retinopathy with macular edema: Secondary | ICD-10-CM | POA: Diagnosis not present

## 2014-04-18 DIAGNOSIS — E11359 Type 2 diabetes mellitus with proliferative diabetic retinopathy without macular edema: Secondary | ICD-10-CM | POA: Diagnosis not present

## 2014-04-18 DIAGNOSIS — H3340 Traction detachment of retina, unspecified eye: Secondary | ICD-10-CM | POA: Diagnosis not present

## 2014-04-18 DIAGNOSIS — E1139 Type 2 diabetes mellitus with other diabetic ophthalmic complication: Secondary | ICD-10-CM | POA: Diagnosis not present

## 2014-04-18 DIAGNOSIS — E11349 Type 2 diabetes mellitus with severe nonproliferative diabetic retinopathy without macular edema: Secondary | ICD-10-CM | POA: Diagnosis not present

## 2014-04-18 DIAGNOSIS — H4310 Vitreous hemorrhage, unspecified eye: Secondary | ICD-10-CM | POA: Diagnosis not present

## 2014-04-18 DIAGNOSIS — H2510 Age-related nuclear cataract, unspecified eye: Secondary | ICD-10-CM | POA: Diagnosis not present

## 2014-04-29 DIAGNOSIS — E11311 Type 2 diabetes mellitus with unspecified diabetic retinopathy with macular edema: Secondary | ICD-10-CM | POA: Diagnosis not present

## 2014-04-29 DIAGNOSIS — E11349 Type 2 diabetes mellitus with severe nonproliferative diabetic retinopathy without macular edema: Secondary | ICD-10-CM | POA: Diagnosis not present

## 2014-04-29 DIAGNOSIS — E1139 Type 2 diabetes mellitus with other diabetic ophthalmic complication: Secondary | ICD-10-CM | POA: Diagnosis not present

## 2014-04-29 DIAGNOSIS — H4310 Vitreous hemorrhage, unspecified eye: Secondary | ICD-10-CM | POA: Diagnosis not present

## 2014-04-29 DIAGNOSIS — E11359 Type 2 diabetes mellitus with proliferative diabetic retinopathy without macular edema: Secondary | ICD-10-CM | POA: Diagnosis not present

## 2014-04-29 DIAGNOSIS — H3340 Traction detachment of retina, unspecified eye: Secondary | ICD-10-CM | POA: Diagnosis not present

## 2014-04-29 DIAGNOSIS — H2510 Age-related nuclear cataract, unspecified eye: Secondary | ICD-10-CM | POA: Diagnosis not present

## 2014-05-06 DIAGNOSIS — H2510 Age-related nuclear cataract, unspecified eye: Secondary | ICD-10-CM | POA: Diagnosis not present

## 2014-05-06 DIAGNOSIS — E1139 Type 2 diabetes mellitus with other diabetic ophthalmic complication: Secondary | ICD-10-CM | POA: Diagnosis not present

## 2014-05-06 DIAGNOSIS — H4310 Vitreous hemorrhage, unspecified eye: Secondary | ICD-10-CM | POA: Diagnosis not present

## 2014-05-06 DIAGNOSIS — E11359 Type 2 diabetes mellitus with proliferative diabetic retinopathy without macular edema: Secondary | ICD-10-CM | POA: Diagnosis not present

## 2014-05-06 DIAGNOSIS — E11311 Type 2 diabetes mellitus with unspecified diabetic retinopathy with macular edema: Secondary | ICD-10-CM | POA: Diagnosis not present

## 2014-05-06 DIAGNOSIS — E11349 Type 2 diabetes mellitus with severe nonproliferative diabetic retinopathy without macular edema: Secondary | ICD-10-CM | POA: Diagnosis not present

## 2014-05-06 DIAGNOSIS — H3340 Traction detachment of retina, unspecified eye: Secondary | ICD-10-CM | POA: Diagnosis not present

## 2014-05-22 DIAGNOSIS — H3340 Traction detachment of retina, unspecified eye: Secondary | ICD-10-CM | POA: Diagnosis not present

## 2014-05-22 DIAGNOSIS — E11359 Type 2 diabetes mellitus with proliferative diabetic retinopathy without macular edema: Secondary | ICD-10-CM | POA: Diagnosis not present

## 2014-05-22 DIAGNOSIS — H4310 Vitreous hemorrhage, unspecified eye: Secondary | ICD-10-CM | POA: Diagnosis not present

## 2014-07-31 DIAGNOSIS — H2589 Other age-related cataract: Secondary | ICD-10-CM | POA: Diagnosis not present

## 2014-09-26 DIAGNOSIS — H5211 Myopia, right eye: Secondary | ICD-10-CM | POA: Diagnosis not present

## 2014-09-26 DIAGNOSIS — H52222 Regular astigmatism, left eye: Secondary | ICD-10-CM | POA: Diagnosis not present

## 2014-09-26 DIAGNOSIS — E11351 Type 2 diabetes mellitus with proliferative diabetic retinopathy with macular edema: Secondary | ICD-10-CM | POA: Diagnosis not present

## 2014-09-26 DIAGNOSIS — E10341 Type 1 diabetes mellitus with severe nonproliferative diabetic retinopathy with macular edema: Secondary | ICD-10-CM | POA: Diagnosis not present

## 2014-09-26 DIAGNOSIS — H2511 Age-related nuclear cataract, right eye: Secondary | ICD-10-CM | POA: Diagnosis not present

## 2014-11-22 ENCOUNTER — Inpatient Hospital Stay (HOSPITAL_COMMUNITY)
Admission: EM | Admit: 2014-11-22 | Discharge: 2014-11-30 | DRG: 673 | Disposition: A | Discharge status: Home / Self Care | Payer: Medicaid Other | Attending: Internal Medicine | Admitting: Internal Medicine

## 2014-11-22 ENCOUNTER — Emergency Department (HOSPITAL_COMMUNITY): Payer: Medicaid Other

## 2014-11-22 ENCOUNTER — Encounter (HOSPITAL_COMMUNITY): Payer: Self-pay | Admitting: Emergency Medicine

## 2014-11-22 DIAGNOSIS — Z794 Long term (current) use of insulin: Secondary | ICD-10-CM

## 2014-11-22 DIAGNOSIS — Z79899 Other long term (current) drug therapy: Secondary | ICD-10-CM

## 2014-11-22 DIAGNOSIS — I10 Essential (primary) hypertension: Secondary | ICD-10-CM

## 2014-11-22 DIAGNOSIS — D500 Iron deficiency anemia secondary to blood loss (chronic): Secondary | ICD-10-CM | POA: Diagnosis present

## 2014-11-22 DIAGNOSIS — I471 Supraventricular tachycardia: Secondary | ICD-10-CM | POA: Diagnosis present

## 2014-11-22 DIAGNOSIS — N189 Chronic kidney disease, unspecified: Secondary | ICD-10-CM

## 2014-11-22 DIAGNOSIS — I129 Hypertensive chronic kidney disease with stage 1 through stage 4 chronic kidney disease, or unspecified chronic kidney disease: Secondary | ICD-10-CM | POA: Diagnosis present

## 2014-11-22 DIAGNOSIS — I272 Other secondary pulmonary hypertension: Secondary | ICD-10-CM | POA: Diagnosis present

## 2014-11-22 DIAGNOSIS — R319 Hematuria, unspecified: Secondary | ICD-10-CM | POA: Diagnosis present

## 2014-11-22 DIAGNOSIS — N179 Acute kidney failure, unspecified: Secondary | ICD-10-CM | POA: Diagnosis present

## 2014-11-22 DIAGNOSIS — H538 Other visual disturbances: Secondary | ICD-10-CM | POA: Diagnosis not present

## 2014-11-22 DIAGNOSIS — N186 End stage renal disease: Secondary | ICD-10-CM | POA: Diagnosis present

## 2014-11-22 DIAGNOSIS — Z87891 Personal history of nicotine dependence: Secondary | ICD-10-CM

## 2014-11-22 DIAGNOSIS — Z9119 Patient's noncompliance with other medical treatment and regimen: Secondary | ICD-10-CM | POA: Diagnosis present

## 2014-11-22 DIAGNOSIS — E872 Acidosis: Secondary | ICD-10-CM | POA: Diagnosis present

## 2014-11-22 DIAGNOSIS — G250 Essential tremor: Secondary | ICD-10-CM | POA: Diagnosis present

## 2014-11-22 DIAGNOSIS — E871 Hypo-osmolality and hyponatremia: Secondary | ICD-10-CM

## 2014-11-22 DIAGNOSIS — R42 Dizziness and giddiness: Secondary | ICD-10-CM | POA: Diagnosis not present

## 2014-11-22 DIAGNOSIS — Z7982 Long term (current) use of aspirin: Secondary | ICD-10-CM | POA: Diagnosis not present

## 2014-11-22 DIAGNOSIS — K640 First degree hemorrhoids: Secondary | ICD-10-CM | POA: Diagnosis present

## 2014-11-22 DIAGNOSIS — K2970 Gastritis, unspecified, without bleeding: Secondary | ICD-10-CM | POA: Diagnosis present

## 2014-11-22 DIAGNOSIS — I517 Cardiomegaly: Secondary | ICD-10-CM | POA: Diagnosis not present

## 2014-11-22 DIAGNOSIS — R195 Other fecal abnormalities: Secondary | ICD-10-CM | POA: Diagnosis present

## 2014-11-22 DIAGNOSIS — E1122 Type 2 diabetes mellitus with diabetic chronic kidney disease: Secondary | ICD-10-CM

## 2014-11-22 DIAGNOSIS — Z992 Dependence on renal dialysis: Secondary | ICD-10-CM

## 2014-11-22 DIAGNOSIS — I5021 Acute systolic (congestive) heart failure: Secondary | ICD-10-CM | POA: Diagnosis present

## 2014-11-22 DIAGNOSIS — D631 Anemia in chronic kidney disease: Secondary | ICD-10-CM | POA: Diagnosis present

## 2014-11-22 DIAGNOSIS — H539 Unspecified visual disturbance: Secondary | ICD-10-CM

## 2014-11-22 DIAGNOSIS — N200 Calculus of kidney: Secondary | ICD-10-CM | POA: Diagnosis not present

## 2014-11-22 DIAGNOSIS — I43 Cardiomyopathy in diseases classified elsewhere: Secondary | ICD-10-CM | POA: Diagnosis present

## 2014-11-22 DIAGNOSIS — E1121 Type 2 diabetes mellitus with diabetic nephropathy: Secondary | ICD-10-CM | POA: Diagnosis present

## 2014-11-22 DIAGNOSIS — E875 Hyperkalemia: Secondary | ICD-10-CM | POA: Diagnosis present

## 2014-11-22 DIAGNOSIS — Z89422 Acquired absence of other left toe(s): Secondary | ICD-10-CM | POA: Diagnosis not present

## 2014-11-22 DIAGNOSIS — R079 Chest pain, unspecified: Secondary | ICD-10-CM | POA: Diagnosis not present

## 2014-11-22 DIAGNOSIS — D649 Anemia, unspecified: Secondary | ICD-10-CM

## 2014-11-22 DIAGNOSIS — L02519 Cutaneous abscess of unspecified hand: Secondary | ICD-10-CM

## 2014-11-22 DIAGNOSIS — N19 Unspecified kidney failure: Secondary | ICD-10-CM

## 2014-11-22 DIAGNOSIS — L03119 Cellulitis of unspecified part of limb: Secondary | ICD-10-CM

## 2014-11-22 DIAGNOSIS — R0602 Shortness of breath: Secondary | ICD-10-CM | POA: Diagnosis present

## 2014-11-22 DIAGNOSIS — N289 Disorder of kidney and ureter, unspecified: Secondary | ICD-10-CM | POA: Diagnosis not present

## 2014-11-22 DIAGNOSIS — I420 Dilated cardiomyopathy: Secondary | ICD-10-CM

## 2014-11-22 DIAGNOSIS — E119 Type 2 diabetes mellitus without complications: Secondary | ICD-10-CM

## 2014-11-22 LAB — URINALYSIS W MICROSCOPIC (NOT AT ARMC)
Bilirubin Urine: NEGATIVE
Glucose, UA: 100 mg/dL — AB
Ketones, ur: NEGATIVE mg/dL
Leukocytes, UA: NEGATIVE
Nitrite: NEGATIVE
Protein, ur: 100 mg/dL — AB
Specific Gravity, Urine: 1.011 (ref 1.005–1.030)
Urobilinogen, UA: 0.2 mg/dL (ref 0.0–1.0)
pH: 5.5 (ref 5.0–8.0)

## 2014-11-22 LAB — CBG MONITORING, ED
GLUCOSE-CAPILLARY: 129 mg/dL — AB (ref 70–99)
GLUCOSE-CAPILLARY: 94 mg/dL (ref 70–99)

## 2014-11-22 LAB — BASIC METABOLIC PANEL
Anion gap: 22 — ABNORMAL HIGH (ref 5–15)
Anion gap: 21 — ABNORMAL HIGH (ref 5–15)
BUN: 107 mg/dL — ABNORMAL HIGH (ref 6–23)
BUN: 108 mg/dL — ABNORMAL HIGH (ref 6–23)
CALCIUM: 8.1 mg/dL — AB (ref 8.4–10.5)
CO2: 11 mEq/L — ABNORMAL LOW (ref 19–32)
CO2: 12 mEq/L — ABNORMAL LOW (ref 19–32)
CREATININE: 13.72 mg/dL — AB (ref 0.50–1.35)
Calcium: 8.6 mg/dL (ref 8.4–10.5)
Chloride: 103 mEq/L (ref 96–112)
Chloride: 101 mEq/L (ref 96–112)
Creatinine, Ser: 13.66 mg/dL — ABNORMAL HIGH (ref 0.50–1.35)
GFR calc Af Amer: 4 mL/min — ABNORMAL LOW (ref >90)
GFR calc Af Amer: 4 mL/min — ABNORMAL LOW (ref >90)
GFR calc non Af Amer: 3 mL/min — ABNORMAL LOW (ref >90)
GFR calc non Af Amer: 3 mL/min — ABNORMAL LOW (ref >90)
Glucose, Bld: 142 mg/dL — ABNORMAL HIGH (ref 70–99)
Glucose, Bld: 103 mg/dL — ABNORMAL HIGH (ref 70–99)
Potassium: 5.5 mEq/L — ABNORMAL HIGH (ref 3.7–5.3)
Potassium: 5.3 mEq/L (ref 3.7–5.3)
Sodium: 136 mEq/L — ABNORMAL LOW (ref 137–147)
Sodium: 134 mEq/L — ABNORMAL LOW (ref 137–147)

## 2014-11-22 LAB — CBC
HCT: 16.4 % — ABNORMAL LOW (ref 39.0–52.0)
Hemoglobin: 5.3 g/dL — CL (ref 13.0–17.0)
MCH: 25.7 pg — ABNORMAL LOW (ref 26.0–34.0)
MCHC: 32.3 g/dL (ref 30.0–36.0)
MCV: 79.6 fL (ref 78.0–100.0)
Platelets: 373 10*3/uL (ref 150–400)
RBC: 2.06 MIL/uL — ABNORMAL LOW (ref 4.22–5.81)
RDW: 17.6 % — ABNORMAL HIGH (ref 11.5–15.5)
WBC: 9.1 10*3/uL (ref 4.0–10.5)

## 2014-11-22 LAB — SAVE SMEAR

## 2014-11-22 LAB — TROPONIN I
Troponin I: <0.3 ng/mL (ref <0.30)
Troponin I: <0.3 ng/mL (ref <0.30)

## 2014-11-22 LAB — I-STAT TROPONIN, ED: Troponin i, poc: 0.05 ng/mL (ref 0.00–0.08)

## 2014-11-22 LAB — IRON AND TIBC
Iron: 16 ug/dL — ABNORMAL LOW (ref 42–135)
Saturation Ratios: 6 % — ABNORMAL LOW (ref 20–55)
TIBC: 253 ug/dL (ref 215–435)
UIBC: 237 ug/dL (ref 125–400)

## 2014-11-22 LAB — RETICULOCYTES
RBC.: 1.89 MIL/uL — ABNORMAL LOW (ref 4.22–5.81)
Retic Count, Absolute: 47.3 10*3/uL (ref 19.0–186.0)
Retic Ct Pct: 2.5 % (ref 0.4–3.1)

## 2014-11-22 LAB — PRO B NATRIURETIC PEPTIDE: Pro B Natriuretic peptide (BNP): 24651 pg/mL — ABNORMAL HIGH (ref 0–125)

## 2014-11-22 LAB — PREPARE RBC (CROSSMATCH)

## 2014-11-22 LAB — FOLATE: Folate: 12.8 ng/mL

## 2014-11-22 LAB — ABO/RH: ABO/RH(D): O POS

## 2014-11-22 LAB — VITAMIN B12: Vitamin B-12: 713 pg/mL (ref 211–911)

## 2014-11-22 LAB — CREATININE, URINE, RANDOM: Creatinine, Urine: 81 mg/dL

## 2014-11-22 LAB — FERRITIN: Ferritin: 45 ng/mL (ref 22–322)

## 2014-11-22 MED ORDER — SODIUM CHLORIDE 0.9 % IV SOLN
10.0000 mL/h | Freq: Once | INTRAVENOUS | Status: AC
  Administered 2014-11-27: 08:00:00 via INTRAVENOUS

## 2014-11-22 MED ORDER — SODIUM BICARBONATE 8.4 % IV SOLN
50.0000 meq | Freq: Once | INTRAVENOUS | Status: AC
  Administered 2014-11-22: 50 meq via INTRAVENOUS
  Filled 2014-11-22: qty 50

## 2014-11-22 MED ORDER — MORPHINE SULFATE 4 MG/ML IJ SOLN: 4.0000 mg | Freq: Once | INTRAMUSCULAR | Status: DC

## 2014-11-22 MED ORDER — LABETALOL HCL 5 MG/ML IV SOLN
15.0000 mg | Freq: Once | INTRAVENOUS | Status: AC
  Administered 2014-11-22: 15 mg via INTRAVENOUS
  Filled 2014-11-22: qty 4

## 2014-11-22 NOTE — ED Notes (Signed)
Pt reports chest pain and sob for past several days. Also reports blurred vision with episodes of sob. Reports sore throat and chills for over a week. Also nausea, lightheadedness and weakness.

## 2014-11-22 NOTE — ED Notes (Signed)
Report given to Sharrie Rothman, RN  End of assignment

## 2014-11-22 NOTE — ED Notes (Signed)
Patient currently denies c/o CP  Patient states, "My chest only hurts when I get out of breath"

## 2014-11-22 NOTE — H&P (Addendum)
PCP: [name], MD    Chief Complaint:  Shortness of breath and chest pain  HPI: [name] is a 63 y.o. male   has a past medical history of Diabetes mellitus; HTN (hypertension); and Osteomyelitis.   Presented with  Progressive shortness of breath over the past 1 month. Occasional sharp chest pain, he has been having having occasional episode of dimming vision while he is short of breath. Denies any localized weakness. He has occasional tingling. He was found to have Hg of 5.3 and Cr 13 he reports some nausea, occasional dark stool but no blood in stools. He reports that lately he started to require less insulin and started to have low blood sugar.  patient reports having some tingling in one of hs fingers on the right. He also feels that his vision has been a bit dim.  He has had chronic tremor.   Hospitalist was called for admission for  Anemia and acute renal failure   Review of Systems:    Pertinent positives include:   chills, chest pain, shortness of breath at rest. dyspnea on exertion,  Constitutional:  No weight loss, night sweats, Fevers,fatigue, weight loss  HEENT:  No headaches, Difficulty swallowing,Tooth/dental problems,Sore throat,  No sneezing, itching, ear ache, nasal congestion, post nasal drip,  Cardio-vascular:  No Orthopnea, PND, anasarca, dizziness, palpitations.no Bilateral lower extremity swelling  GI:  No heartburn, indigestion, abdominal pain, nausea, vomiting, diarrhea, change in bowel habits, loss of appetite, melena, blood in stool, hematemesis Resp:  No excess mucus, no productive cough, No non-productive cough, No coughing up of blood.No change in color of mucus. No wheezing. Skin:  no rash or lesions. No jaundice GU:  no dysuria, change in color of urine, no urgency or frequency. No straining to urinate.  No flank pain.  Musculoskeletal:  No joint pain or no joint swelling. No decreased range of motion. No back pain.  Psych:  No  change in mood or affect. No depression or anxiety. No memory loss.  Neuro: no localizing neurological complaints, no tingling, no weakness, no double vision, no gait abnormality, no slurred speech, no confusion  Otherwise ROS are negative except for above, 10 systems were reviewed  Past Medical History: Past Medical History  Diagnosis Date  . Diabetes mellitus   . HTN (hypertension)   . Osteomyelitis    Past Surgical History  Procedure Laterality Date  . Left 4th toe amputation    . Cholecystectomy    . Tonsillectomy    . I&d extremity  01/28/2012    Procedure: IRRIGATION AND DEBRIDEMENT EXTREMITY;  Surgeon: Paulene Floor, MD;  Location: [facility];  Service: Orthopedics;  Laterality: Left;  irrigation and drainage left hand  . I&d extremity  01/30/2012    Procedure: IRRIGATION AND DEBRIDEMENT EXTREMITY;  Surgeon: Paulene Floor, MD;  Location: [facility];  Service: Orthopedics;  Laterality: Left;  flexor teno synovectomy and extensor tenosynovectomy arthrosynovectomy wristjoint  . I&d extremity  02/01/2012    Procedure: IRRIGATION AND DEBRIDEMENT EXTREMITY;  Surgeon: Paulene Floor, MD;  Location: [facility];  Service: Orthopedics;  Laterality: Left;  Left Wrist, Hand and Forearm     Medications: Prior to Admission medications   Medication Sig Start Date End Date Taking? Authorizing Provider  insulin NPH-insulin regular (NOVOLIN 70/30) (70-30) 100 UNIT/ML injection Inject 15 Units into the skin 2 (two) times daily with a meal.   Yes Historical Provider, MD  amLODipine (NORVASC) 10 MG tablet Take  1 tablet (10 mg total) by mouth daily. Patient not taking: Reported on 11/22/2014 02/04/12 02/03/13  Sheila Oats, MD  aspirin 81 MG tablet Take 81 mg by mouth daily.    Historical Provider, MD    Allergies:   Allergies  Allergen Reactions  . Penicillins Hives and Swelling    Social History:  Ambulatory   Independently  Lives at home With family     reports that he has quit  smoking. His smoking use included Cigarettes. He has a 2 pack-year smoking history. He has never used smokeless tobacco. He reports that he does not drink alcohol or use illicit drugs.    Family History: family history includes Diabetes in his brother, brother, brother, father, and sister; Heart disease in his mother.    Physical Exam: Patient Vitals for the past 24 hrs:  BP Temp Temp src Pulse Resp SpO2  11/22/14 2000 159/67 mmHg - - 78 - 99 %  11/22/14 1942 163/81 mmHg - - 76 20 95 %  11/22/14 1930 163/81 mmHg - - 77 - 99 %  11/22/14 1914 169/83 mmHg - - 80 - 98 %  11/22/14 1843 182/94 mmHg 98.8 F (37.1 C) Oral 88 20 99 %  11/22/14 1754 198/94 mmHg - - 88 - 99 %  11/22/14 1629 191/92 mmHg - - 87 19 98 %  11/22/14 1622 - - - 87 21 99 %  11/22/14 1520 191/90 mmHg 97.9 F (36.6 C) Oral 93 22 99 %    1. General:  in No Acute distress 2. Psychological: Alert and  Oriented 3. Head/ENT:    Dry Mucous Membranes                          Head Non traumatic, neck supple                          Normal   Dentition 4. SKIN:  decreased Skin turgor,  Skin clean Dry and intact no rash 5. Heart: Regular rate and rhythm no Murmur, Rub or gallop 6. Lungs: Clear to auscultation bilaterally, no wheezes or crackles   7. Abdomen: Soft, non-tender, Non distended 8. Lower extremities: no clubbing, cyanosis, trace edema 9. Neurologically patient has generalized fatigue but no localized strength deficits. Strength appears to be intact throughout. Cranial nerves II through XII intact. Normal sensation. Funduscopic exam performed by ER physician showed no gross abnormality, patient is tremulous which she states is chronic 10. MSK: Normal range of motion  body mass index is unknown because there is no weight on file.   Labs on Admission:   Results for orders placed or performed during the hospital encounter of 11/22/14 (from the past 24 hour(s))  CBG monitoring, ED     Status: Abnormal   Collection  Time: 11/22/14  3:07 PM  Result Value Ref Range   Glucose-Capillary 129 (H) 70 - 99 mg/dL  Basic metabolic panel     Status: Abnormal   Collection Time: 11/22/14  3:27 PM  Result Value Ref Range   Sodium 136 (L) 137 - 147 mEq/L   Potassium 5.5 (H) 3.7 - 5.3 mEq/L   Chloride 103 96 - 112 mEq/L   CO2 11 (L) 19 - 32 mEq/L   Glucose, Bld 142 (H) 70 - 99 mg/dL   BUN 107 (H) 6 - 23 mg/dL   Creatinine, Ser 13.66 (H) 0.50 - 1.35 mg/dL   Calcium  8.6 8.4 - 10.5 mg/dL   GFR calc non Af Amer 3 (L) >90 mL/min   GFR calc Af Amer 4 (L) >90 mL/min   Anion gap 22 (H) 5 - 15  CBC     Status: Abnormal   Collection Time: 11/22/14  3:27 PM  Result Value Ref Range   WBC 9.1 4.0 - 10.5 K/uL   RBC 2.06 (L) 4.22 - 5.81 MIL/uL   Hemoglobin 5.3 (LL) 13.0 - 17.0 g/dL   HCT 16.4 (L) 39.0 - 52.0 %   MCV 79.6 78.0 - 100.0 fL   MCH 25.7 (L) 26.0 - 34.0 pg   MCHC 32.3 30.0 - 36.0 g/dL   RDW 17.6 (H) 11.5 - 15.5 %   Platelets 373 150 - 400 K/uL  I-stat troponin, ED (not at [facility])     Status: None   Collection Time: 11/22/14  3:50 PM  Result Value Ref Range   Troponin i, poc 0.05 0.00 - 0.08 ng/mL   Comment 3          Vitamin B12     Status: None   Collection Time: 11/22/14  5:04 PM  Result Value Ref Range   Vitamin B-12 713 211 - 911 pg/mL  Folate     Status: None   Collection Time: 11/22/14  5:04 PM  Result Value Ref Range   Folate 12.8 ng/mL  Iron and TIBC     Status: Abnormal   Collection Time: 11/22/14  5:04 PM  Result Value Ref Range   Iron 16 (L) 42 - 135 ug/dL   TIBC 253 215 - 435 ug/dL   Saturation Ratios 6 (L) 20 - 55 %   UIBC 237 125 - 400 ug/dL  Ferritin     Status: None   Collection Time: 11/22/14  5:04 PM  Result Value Ref Range   Ferritin 45 22 - 322 ng/mL  Reticulocytes     Status: Abnormal   Collection Time: 11/22/14  5:04 PM  Result Value Ref Range   Retic Ct Pct 2.5 0.4 - 3.1 %   RBC. 1.89 (L) 4.22 - 5.81 MIL/uL   Retic Count, Manual 47.3 19.0 - 186.0 K/uL  Type and screen      Status: None (Preliminary result)   Collection Time: 11/22/14  5:04 PM  Result Value Ref Range   ABO/RH(D) O POS    Antibody Screen NEG    Sample Expiration 11/25/2014    Unit Number [id number]    Blood Component Type RED CELLS,LR    Unit division 00    Status of Unit ISSUED    Transfusion Status OK TO TRANSFUSE    Crossmatch Result Compatible    Unit Number [id number]    Blood Component Type RED CELLS,LR    Unit division 00    Status of Unit ALLOCATED    Transfusion Status OK TO TRANSFUSE    Crossmatch Result Compatible   Save smear     Status: None   Collection Time: 11/22/14  5:04 PM  Result Value Ref Range   Smear Review SMEAR STAINED AND AVAILABLE FOR REVIEW   Troponin I     Status: None   Collection Time: 11/22/14  5:04 PM  Result Value Ref Range   Troponin I <0.30 <0.30 ng/mL  Pro b natriuretic peptide     Status: Abnormal   Collection Time: 11/22/14  5:09 PM  Result Value Ref Range   Pro B Natriuretic peptide (BNP) 24651.0 (H) 0 - 125 pg/mL  Urinalysis with microscopic     Status: Abnormal   Collection Time: 11/22/14  5:40 PM  Result Value Ref Range   Color, Urine YELLOW YELLOW   APPearance CLEAR CLEAR   Specific Gravity, Urine 1.011 1.005 - 1.030   pH 5.5 5.0 - 8.0   Glucose, UA 100 (A) NEGATIVE mg/dL   Hgb urine dipstick MODERATE (A) NEGATIVE   Bilirubin Urine NEGATIVE NEGATIVE   Ketones, ur NEGATIVE NEGATIVE mg/dL   Protein, ur 100 (A) NEGATIVE mg/dL   Urobilinogen, UA 0.2 0.0 - 1.0 mg/dL   Nitrite NEGATIVE NEGATIVE   Leukocytes, UA NEGATIVE NEGATIVE   WBC, UA 0-2 <3 WBC/hpf   RBC / HPF 0-2 <3 RBC/hpf   Squamous Epithelial / LPF RARE RARE  Creatinine, urine, random     Status: None   Collection Time: 11/22/14  5:40 PM  Result Value Ref Range   Creatinine, Urine 81.0 mg/dL  Prepare RBC     Status: None   Collection Time: 11/22/14  6:17 PM  Result Value Ref Range   Order Confirmation ORDER PROCESSED BY BLOOD BANK   ABO/Rh     Status: None    Collection Time: 11/22/14  6:43 PM  Result Value Ref Range   ABO/RH(D) O POS     UA protein uria and glucose in urine  Lab Results  Component Value Date   HGBA1C 7.4* 01/28/2012    CrCl cannot be calculated (Unknown ideal weight.).  BNP (last 3 results)  Recent Labs  11/22/14 1709  PROBNP 24651.0*    Other results:  I have pearsonaly reviewed this: ECG REPORT  Rate: 97  Rhythm: SR ST&T Change: no ischemic changes   There were no vitals filed for this visit.   Cultures:    Component Value Date/Time   SDES WRIST LEFT 02/01/2012 1730   SDES WRIST LEFT 02/01/2012 1730   SPECREQUEST NONE 02/01/2012 1730   SPECREQUEST NONE 02/01/2012 1730   CULT NO ANAEROBES ISOLATED 02/01/2012 1730   CULT NO GROWTH 2 DAYS 02/01/2012 1730   REPTSTATUS 02/06/2012 FINAL 02/01/2012 1730   REPTSTATUS 02/04/2012 FINAL 02/01/2012 1730     Radiological Exams on Admission: Dg Chest 2 View (if Patient Has Fever And/or Copd)  11/22/2014   CLINICAL DATA:  Sternal chest pain and dizziness for 2 days, shortness of breath for a month, sore throat since last week, chills since this summer, personal history of hypertension, diabetes, former smoker  EXAM: CHEST  2 VIEW  COMPARISON:  01/28/2012  FINDINGS: Enlargement of cardiac silhouette with borderline vascular congestion.  Mediastinal contours normal.  Lungs clear.  No infiltrate, pleural effusion or pneumothorax.  Mild biconvex thoracolumbar scoliosis without acute osseous findings.  IMPRESSION: Enlargement of cardiac silhouette.  No acute abnormalities.   Electronically Signed   By: Lavonia Dana M.D.   On: 11/22/2014 16:09    Chart has been reviewed  Assessment/Plan  63 year old gentleman with history of diabetes, hypertension, and chronic kidney disease presents with elevated creatinine after 13 light and hemoglobin of 5.3  Present on Admission:  . Acute renal failure - likely acute on chronic due to history of diabetes and poorly controlled  hypertension due to medical noncompliance. We'll admit to [facility]. Nephrology is aware. Will order urine electrolytes. Patient will receive bicarbonate now and in 6 hours will follow potassium.  Marland Kitchen Anemia - possibly chronic blood loss anemia. Patient had been told in the past that he has severe iron deficiency anemia but has discontinued his iron  supplements. We will transfuse 2 units and follow. Obtain Hemoccult stools.  Marland Kitchen HTN (hypertension) - continue Norvasc dyspnea  - will cycle cardiac enzymes but most likely secondary to anemia Diabetes mellitus - lately has been under better control likely secondary to poor clearance of insulin we'll hold on insulin for now and continue to monitor blood sugar. Tremor - slightly essential tremor Abnormal vision - patient describes fluctuation in brightness and occasionally seeing spots. Denies curtain going down tablet changes.  this has been coming and going funduscopic exam in the ER was unremarkable. We'll need further ophthalmological evaluation, neurologically otherwise intact. Chest x-ray showing cardiomegaly will obtain 2-D echo Prophylaxis: SCD  , Protonix  CODE STATUS:  FULL CODE   Other plan as per orders.  I have spent a total of 55 min on this admission  Francene Mcerlean 11/22/2014, 9:35 PM  [facility]  Pager [phone]   after 2 AM please page floor coverage PA If 7AM-7PM, please contact the day team taking care of the patient  [email or web address]  Password [email or web address]

## 2014-11-22 NOTE — ED Notes (Signed)
Patient and pt's wife reviewing consent for blood transfusion

## 2014-11-22 NOTE — ED Notes (Signed)
Patient initially denied hx of anemia Upon further review of patient record, patient dx with anemia in 2013 was informed that he needed an iron supplement as an outpatient--patient now agrees with this

## 2014-11-22 NOTE — ED Notes (Signed)
Patient and male family member state that they would like to wait until after EDP assessment is completed for nursing staff to place PIV

## 2014-11-22 NOTE — ED Provider Notes (Signed)
CSN: [id number]     Arrival date & time 11/22/14  1448 History   First MD Initiated Contact with Patient 11/22/14 1629     Chief Complaint  Patient presents with  . Chest Pain  . Shortness of Breath     (Consider location/radiation/quality/duration/timing/severity/associated sxs/prior Treatment) HPI   63yM with progressively worsening dyspnea particularly with exertion. Worsening since "this summer." Within the past couple weeks has progressed to point where he cannot walk up a flight of steps without stopping to rest. Cold intolerance. Just cannot get warm. No fever. No cough. Presiously been told anemic. Normocytic anemia noted on previous admission over 2 years ago. Not compliant with Fe supplementation. Denies BRBPR. No melena. No blood thinners. Marked renal impairment. Hx of HTN and DM. Noncompliant with BP meds as well. Occasional LE edema which he says normally resolved with elevation of LE. Within past 3-4 days it has been markedly worse. Reports that he is urinating "a lot." Denies hematuria or dark colored urine. Denies much NSAID use aside from occasional ibuprofen for aches/pains which he estimates is once every few weeks. Sore throat which began about 1.5 weeks ago. Describes stabbing sensation which has since resolved in the past coupe days. Nauseated. No vomiting. No diarrhea. Just generally does not feel well.   Past Medical History  Diagnosis Date  . Diabetes mellitus   . HTN (hypertension)   . Osteomyelitis    Past Surgical History  Procedure Laterality Date  . Left 4th toe amputation    . Cholecystectomy    . Tonsillectomy    . I&d extremity  01/28/2012    Procedure: IRRIGATION AND DEBRIDEMENT EXTREMITY;  Surgeon: Paulene Floor, MD;  Location: [facility];  Service: Orthopedics;  Laterality: Left;  irrigation and drainage left hand  . I&d extremity  01/30/2012    Procedure: IRRIGATION AND DEBRIDEMENT EXTREMITY;  Surgeon: Paulene Floor, MD;  Location: [facility];   Service: Orthopedics;  Laterality: Left;  flexor teno synovectomy and extensor tenosynovectomy arthrosynovectomy wristjoint  . I&d extremity  02/01/2012    Procedure: IRRIGATION AND DEBRIDEMENT EXTREMITY;  Surgeon: Paulene Floor, MD;  Location: [facility];  Service: Orthopedics;  Laterality: Left;  Left Wrist, Hand and Forearm   Family History  Problem Relation Age of Onset  . Heart disease Mother   . Diabetes Father   . Diabetes Brother   . Diabetes Sister   . Diabetes Brother   . Diabetes Brother    History  Substance Use Topics  . Smoking status: Former Smoker -- 0.50 packs/day for 4 years    Types: Cigarettes  . Smokeless tobacco: Never Used  . Alcohol Use: No    Review of Systems  All systems reviewed and negative, other than as noted in HPI.   Allergies  Penicillins  Home Medications   Prior to Admission medications   Medication Sig Start Date End Date Taking? Authorizing Provider  amLODipine (NORVASC) 10 MG tablet Take 1 tablet (10 mg total) by mouth daily. 02/04/12 02/03/13  Sheila Oats, MD  aspirin 81 MG tablet Take 81 mg by mouth daily.    Historical Provider, MD  ferrous sulfate 325 (65 FE) MG tablet Take 1 tablet (325 mg total) by mouth 2 (two) times daily. 02/04/12 02/03/13  Sheila Oats, MD  insulin NPH-insulin regular (NOVOLIN 70/30) (70-30) 100 UNIT/ML injection Inject 15 Units into the skin 2 (two) times daily with a meal.    Historical Provider, MD  BP 191/90 mmHg  Pulse 87  Temp(Src) 97.9 F (36.6 C) (Oral)  Resp 21  SpO2 99% Physical Exam  Constitutional: He is oriented to person, place, and time. He appears well-developed and well-nourished. No distress.  HENT:  Head: Normocephalic and atraumatic.  Eyes: Right eye exhibits no discharge. Left eye exhibits no discharge.  Pale conjunctiva  Neck: Neck supple.  Cardiovascular: Normal rate, regular rhythm and normal heart sounds.  Exam reveals no gallop and no friction rub.   No murmur  heard. Pulmonary/Chest: Effort normal and breath sounds normal. No respiratory distress.  Abdominal: Soft. He exhibits no distension. There is no tenderness.  Musculoskeletal: He exhibits edema. He exhibits no tenderness.  Symmetric pitting LE edema  Neurological: He is alert and oriented to person, place, and time. No cranial nerve deficit. He exhibits normal muscle tone. Coordination normal.  Skin: Skin is warm and dry.  Psychiatric: He has a normal mood and affect. His behavior is normal. Thought content normal.  Nursing note and vitals reviewed.   ED Course  Procedures (including critical care time)  CRITICAL CARE Performed by: Virgel Manifold   Total critical care time: 35 minutes  Critical care time was exclusive of separately billable procedures and treating other patients. Critical care was necessary to treat or prevent imminent or life-threatening deterioration. Critical care was time spent personally by me on the following activities: development of treatment plan with patient and/or surrogate as well as nursing, discussions with consultants, evaluation of patient's response to treatment, examination of patient, obtaining history from patient or surrogate, ordering and performing treatments and interventions, ordering and review of laboratory studies, ordering and review of radiographic studies, pulse oximetry and re-evaluation of patient's condition.  Labs Review Labs Reviewed  BASIC METABOLIC PANEL - Abnormal; Notable for the following:    Sodium 136 (*)    Potassium 5.5 (*)    CO2 11 (*)    Glucose, Bld 142 (*)    BUN 107 (*)    Creatinine, Ser 13.66 (*)    GFR calc non Af Amer 3 (*)    GFR calc Af Amer 4 (*)    Anion gap 22 (*)    All other components within normal limits  CBC - Abnormal; Notable for the following:    RBC 2.06 (*)    Hemoglobin 5.3 (*)    HCT 16.4 (*)    MCH 25.7 (*)    RDW 17.6 (*)    All other components within normal limits  CBG MONITORING,  ED - Abnormal; Notable for the following:    Glucose-Capillary 129 (*)    All other components within normal limits  I-STAT TROPOININ, ED    Imaging Review Dg Chest 2 View (if Patient Has Fever And/or Copd)  11/22/2014   CLINICAL DATA:  Sternal chest pain and dizziness for 2 days, shortness of breath for a month, sore throat since last week, chills since this summer, personal history of hypertension, diabetes, former smoker  EXAM: CHEST  2 VIEW  COMPARISON:  01/28/2012  FINDINGS: Enlargement of cardiac silhouette with borderline vascular congestion.  Mediastinal contours normal.  Lungs clear.  No infiltrate, pleural effusion or pneumothorax.  Mild biconvex thoracolumbar scoliosis without acute osseous findings.  IMPRESSION: Enlargement of cardiac silhouette.  No acute abnormalities.   Electronically Signed   By: Lavonia Dana M.D.   On: 11/22/2014 16:09     EKG Interpretation   Date/Time:  Friday November 22 2014 14:59:06 EST Ventricular Rate:  97  PR Interval:  165 QRS Duration: 134 QT Interval:  395 QTC Calculation: 502 R Axis:   -13 Text Interpretation:  Sinus rhythm Left bundle branch block agree. no sig  change from old. Confirmed by Johnney Killian, MD, Jeannie Done [phone]) on 11/22/2014  3:03:47 PM      MDM   Final diagnoses:  Symptomatic anemia  Acute renal failure, unspecified acute renal failure type    63yM with symptomatic anemia and RF. Hx of normocytic anemia, but not to this degree. Noncompliant with Fe. Renal failure probably contributing to some degree as well. Reports recent sore throat starting about 1.5w and just getting better. Worsening LE edema in past couple days. PSGN? Urine looks fairly clear though. No mention of casts on UA.  Likely has some degree of chronic renal impairment, but not sure to what degree. Last labs from 2.5 years ago. Cr 1.7 then. Hx of HTN and DM. Noncompliant with BP meds. Pretty hypertensive in ED. EKG showing LVH. Cardiomegaly on CXR. Denies  significant NSAID use.   Metabolic acidosis. Mild hyperK w/o acute EKG changes. Some peripheral edema. Pt reports good urinary output though. No respiratory complaints at rest. Exertional dyspnea more than likely from degree of anemia. BNP is elevated, but sounds clear. CXR w/o overt edema. Normal oxygen sats on RA. Not distressed. Will discuss with nephrology. Will need admitted. May be more prudent to transfer to [facility].     Virgel Manifold, MD 11/22/14 2059

## 2014-11-22 NOTE — ED Notes (Signed)
MD at bedside. 

## 2014-11-22 NOTE — ED Notes (Addendum)
Patient medicated with Normodyne, see MAR Patient's wife informed of plan of care to include RBC transfusion and possible transfer to [facility] for HD tx Patient's wife is asking to speak with Dr. Wilson Singer before proceeding with plan of care--timeline for blood transfusion, when renal consult will take place and when patient will be transferred to [place] made aware

## 2014-11-22 NOTE — ED Notes (Signed)
Patient now back in room Will place PIV site

## 2014-11-22 NOTE — ED Notes (Signed)
Orders for transfusion noted BNP noted Will d/w EDP

## 2014-11-22 NOTE — ED Notes (Signed)
Pt given urinal and made aware of need for urine specimen 

## 2014-11-22 NOTE — ED Notes (Signed)
Per EDP, OK to infuse blood per policy

## 2014-11-22 NOTE — ED Notes (Signed)
Patient stated that he wanted to use bathroom prior to placing PIV access Importance of placing access explained, patient stated that he wanted to use bathroom before "being tied down to everything and an IV" Patient ambulatory with standby assistance

## 2014-11-22 NOTE — ED Notes (Signed)
Per lab, blood ready

## 2014-11-23 ENCOUNTER — Inpatient Hospital Stay (HOSPITAL_COMMUNITY): Payer: Medicaid Other

## 2014-11-23 DIAGNOSIS — I517 Cardiomegaly: Secondary | ICD-10-CM | POA: Diagnosis present

## 2014-11-23 DIAGNOSIS — E1121 Type 2 diabetes mellitus with diabetic nephropathy: Secondary | ICD-10-CM

## 2014-11-23 DIAGNOSIS — I10 Essential (primary) hypertension: Secondary | ICD-10-CM

## 2014-11-23 DIAGNOSIS — N179 Acute kidney failure, unspecified: Secondary | ICD-10-CM

## 2014-11-23 DIAGNOSIS — D500 Iron deficiency anemia secondary to blood loss (chronic): Secondary | ICD-10-CM

## 2014-11-23 DIAGNOSIS — I369 Nonrheumatic tricuspid valve disorder, unspecified: Secondary | ICD-10-CM

## 2014-11-23 DIAGNOSIS — N189 Chronic kidney disease, unspecified: Secondary | ICD-10-CM

## 2014-11-23 DIAGNOSIS — H539 Unspecified visual disturbance: Secondary | ICD-10-CM

## 2014-11-23 LAB — GLUCOSE, CAPILLARY
GLUCOSE-CAPILLARY: 90 mg/dL (ref 70–99)
GLUCOSE-CAPILLARY: 157 mg/dL — AB (ref 70–99)
Glucose-Capillary: 115 mg/dL — ABNORMAL HIGH (ref 70–99)

## 2014-11-23 LAB — ABO/RH: ABO/RH(D): O POS

## 2014-11-23 LAB — CBC
HEMATOCRIT: 23.2 % — AB (ref 39.0–52.0)
HEMOGLOBIN: 7.8 g/dL — AB (ref 13.0–17.0)
MCH: 26.5 pg (ref 26.0–34.0)
MCHC: 33.6 g/dL (ref 30.0–36.0)
MCV: 78.9 fL (ref 78.0–100.0)
Platelets: 290 10*3/uL (ref 150–400)
RBC: 2.94 MIL/uL — ABNORMAL LOW (ref 4.22–5.81)
RDW: 15.5 % (ref 11.5–15.5)
WBC: 9 10*3/uL (ref 4.0–10.5)

## 2014-11-23 LAB — ANTISTREPTOLYSIN O TITER: ASO: 75 IU/mL (ref <409)

## 2014-11-23 LAB — RENAL FUNCTION PANEL
Albumin: 3 g/dL — ABNORMAL LOW (ref 3.5–5.2)
Anion gap: 21 — ABNORMAL HIGH (ref 5–15)
BUN: 101 mg/dL — AB (ref 6–23)
CO2: 13 mEq/L — ABNORMAL LOW (ref 19–32)
Calcium: 8 mg/dL — ABNORMAL LOW (ref 8.4–10.5)
Chloride: 103 mEq/L (ref 96–112)
Creatinine, Ser: 13.04 mg/dL — ABNORMAL HIGH (ref 0.50–1.35)
GFR calc Af Amer: 4 mL/min — ABNORMAL LOW (ref >90)
GFR calc non Af Amer: 4 mL/min — ABNORMAL LOW (ref >90)
Glucose, Bld: 111 mg/dL — ABNORMAL HIGH (ref 70–99)
PHOSPHORUS: 7.6 mg/dL — AB (ref 2.3–4.6)
Potassium: 4.9 mEq/L (ref 3.7–5.3)
Sodium: 137 mEq/L (ref 137–147)

## 2014-11-23 LAB — TROPONIN I

## 2014-11-23 LAB — HEMOGLOBIN AND HEMATOCRIT, BLOOD
HCT: 19.2 % — ABNORMAL LOW (ref 39.0–52.0)
Hemoglobin: 6.2 g/dL — CL (ref 13.0–17.0)

## 2014-11-23 LAB — URINE CULTURE
Colony Count: NO GROWTH
Culture: NO GROWTH

## 2014-11-23 LAB — HEPATITIS B SURFACE ANTIBODY,QUALITATIVE: Hep B S Ab: NEGATIVE

## 2014-11-23 LAB — PREPARE RBC (CROSSMATCH)

## 2014-11-23 LAB — HEPATITIS B CORE ANTIBODY, TOTAL: Hep B Core Total Ab: NONREACTIVE

## 2014-11-23 LAB — HEPATITIS B SURFACE ANTIGEN: Hepatitis B Surface Ag: NEGATIVE

## 2014-11-23 MED ORDER — SODIUM CHLORIDE 0.9 % IV SOLN: Freq: Once | INTRAVENOUS | Status: AC

## 2014-11-23 MED ORDER — LIDOCAINE HCL 1 % IJ SOLN
INTRAMUSCULAR | Status: AC
  Filled 2014-11-23: qty 20

## 2014-11-23 MED ORDER — VANCOMYCIN HCL IN DEXTROSE 1-5 GM/200ML-% IV SOLN
1000.0000 mg | Freq: Once | INTRAVENOUS | Status: AC
  Administered 2014-11-23: 1000 mg via INTRAVENOUS
  Filled 2014-11-23: qty 200

## 2014-11-23 MED ORDER — HEPARIN SODIUM (PORCINE) 1000 UNIT/ML IJ SOLN
INTRAMUSCULAR | Status: AC
  Filled 2014-11-23: qty 1

## 2014-11-23 MED ORDER — ONDANSETRON HCL 4 MG PO TABS: 4.0000 mg | ORAL_TABLET | Freq: Four times a day (QID) | ORAL | Status: DC | PRN

## 2014-11-23 MED ORDER — INSULIN ASPART 100 UNIT/ML ~~LOC~~ SOLN
0.0000 [IU] | Freq: Every day | SUBCUTANEOUS | Status: DC
  Administered 2014-11-27: 2 [IU] via SUBCUTANEOUS

## 2014-11-23 MED ORDER — HYDROCODONE-ACETAMINOPHEN 5-325 MG PO TABS
1.0000 | ORAL_TABLET | ORAL | Status: DC | PRN
  Administered 2014-11-23: 1 via ORAL
  Filled 2014-11-23 (×2): qty 1

## 2014-11-23 MED ORDER — ACETAMINOPHEN 650 MG RE SUPP: 650.0000 mg | Freq: Four times a day (QID) | RECTAL | Status: DC | PRN

## 2014-11-23 MED ORDER — MIDAZOLAM HCL 2 MG/2ML IJ SOLN
INTRAMUSCULAR | Status: AC | PRN
  Administered 2014-11-23: 1 mg via INTRAVENOUS

## 2014-11-23 MED ORDER — DOCUSATE SODIUM 100 MG PO CAPS
100.0000 mg | ORAL_CAPSULE | Freq: Two times a day (BID) | ORAL | Status: DC
  Administered 2014-11-23 – 2014-11-30 (×11): 100 mg via ORAL
  Filled 2014-11-23 (×20): qty 1

## 2014-11-23 MED ORDER — DIPHENHYDRAMINE HCL 25 MG PO CAPS
25.0000 mg | ORAL_CAPSULE | Freq: Once | ORAL | Status: AC
  Administered 2014-11-23: 25 mg via ORAL
  Filled 2014-11-23: qty 1

## 2014-11-23 MED ORDER — AMLODIPINE BESYLATE 10 MG PO TABS
10.0000 mg | ORAL_TABLET | Freq: Every day | ORAL | Status: DC
  Administered 2014-11-23: 10 mg via ORAL
  Filled 2014-11-23 (×2): qty 1

## 2014-11-23 MED ORDER — SODIUM CHLORIDE 0.9 % IJ SOLN
3.0000 mL | Freq: Two times a day (BID) | INTRAMUSCULAR | Status: DC
  Administered 2014-11-25 – 2014-11-29 (×3): 3 mL via INTRAVENOUS

## 2014-11-23 MED ORDER — ACETAMINOPHEN 325 MG PO TABS
650.0000 mg | ORAL_TABLET | Freq: Four times a day (QID) | ORAL | Status: DC | PRN
  Administered 2014-11-24 – 2014-11-25 (×2): 650 mg via ORAL
  Filled 2014-11-23 (×2): qty 2

## 2014-11-23 MED ORDER — INSULIN ASPART 100 UNIT/ML ~~LOC~~ SOLN
0.0000 [IU] | Freq: Three times a day (TID) | SUBCUTANEOUS | Status: DC
  Administered 2014-11-24: 1 [IU] via SUBCUTANEOUS
  Administered 2014-11-26: 3 [IU] via SUBCUTANEOUS

## 2014-11-23 MED ORDER — ACETAMINOPHEN 325 MG PO TABS
650.0000 mg | ORAL_TABLET | Freq: Once | ORAL | Status: AC
  Administered 2014-11-23: 650 mg via ORAL
  Filled 2014-11-23: qty 2

## 2014-11-23 MED ORDER — ONDANSETRON HCL 4 MG/2ML IJ SOLN
4.0000 mg | Freq: Four times a day (QID) | INTRAMUSCULAR | Status: DC | PRN
Start: 2014-11-23 — End: 2014-11-30

## 2014-11-23 MED ORDER — SODIUM CHLORIDE 0.9 % IV SOLN
125.0000 mg | Freq: Every day | INTRAVENOUS | Status: AC
  Administered 2014-11-23 – 2014-11-25 (×3): 125 mg via INTRAVENOUS
  Filled 2014-11-23 (×8): qty 10

## 2014-11-23 MED ORDER — SODIUM CHLORIDE 0.9 % IV SOLN: Freq: Once | INTRAVENOUS | Status: DC

## 2014-11-23 MED ORDER — SODIUM CHLORIDE 0.9 % IJ SOLN
3.0000 mL | Freq: Two times a day (BID) | INTRAMUSCULAR | Status: DC
  Administered 2014-11-23 – 2014-11-29 (×10): 3 mL via INTRAVENOUS

## 2014-11-23 MED ORDER — FENTANYL CITRATE 0.05 MG/ML IJ SOLN
INTRAMUSCULAR | Status: AC
  Filled 2014-11-23: qty 4

## 2014-11-23 MED ORDER — SODIUM BICARBONATE 8.4 % IV SOLN
50.0000 meq | Freq: Once | INTRAVENOUS | Status: AC
  Administered 2014-11-23: 50 meq via INTRAVENOUS
  Filled 2014-11-23: qty 50

## 2014-11-23 MED ORDER — SODIUM CHLORIDE 0.9 % IJ SOLN: 3.0000 mL | INTRAMUSCULAR | Status: DC | PRN

## 2014-11-23 MED ORDER — MIDAZOLAM HCL 2 MG/2ML IJ SOLN
INTRAMUSCULAR | Status: AC
  Filled 2014-11-23: qty 4

## 2014-11-23 MED ORDER — SODIUM CHLORIDE 0.9 % IV SOLN: 250.0000 mL | INTRAVENOUS | Status: DC | PRN

## 2014-11-23 MED ORDER — GLUCERNA SHAKE PO LIQD
237.0000 mL | Freq: Two times a day (BID) | ORAL | Status: DC
  Administered 2014-11-24 – 2014-11-25 (×3): 237 mL via ORAL

## 2014-11-23 MED ORDER — SODIUM CHLORIDE 0.9 % IV SOLN
Freq: Once | INTRAVENOUS | Status: AC
  Administered 2014-11-23: 01:00:00 via INTRAVENOUS

## 2014-11-23 MED ORDER — PANTOPRAZOLE SODIUM 40 MG PO TBEC
40.0000 mg | DELAYED_RELEASE_TABLET | Freq: Every day | ORAL | Status: DC
  Administered 2014-11-23 – 2014-11-30 (×7): 40 mg via ORAL
  Filled 2014-11-23 (×8): qty 1

## 2014-11-23 MED ORDER — FENTANYL CITRATE 0.05 MG/ML IJ SOLN
INTRAMUSCULAR | Status: AC | PRN
  Administered 2014-11-23: 50 ug via INTRAVENOUS

## 2014-11-23 NOTE — Consult Note (Signed)
Requesting Physician:  Dr. Roel Cluck Reason for Consult:  Renal failure Primary Care: [facility] (Dr. Kenton Kingfisher) - but has not seen them in over 3 years HPI: The patient is a 63 y.o. year-old AAM with background of DM, HTN and CKD (was seen at [facility] "once in the past but I didn't go back because I didn't have insurance" where he was told at that time he had proteinuria and diabetic nephropathy.  Has not seen a physician in 3 years (other than a hospitalization in 2013 when creatinine was around 2), presented to the ED at [facility] with SOB, fatigue, burning in his chest, nausea.  In the ED at [facility], his creatinine was 13.7, bicarb 11, BUN 107, K 5.5 and anemic with Hb of 5.3 and iron deficient. He was transferred to [facility] for possible need for HD.  He endorses appetite loss and early AM nausea since the summer, sleep disturbance, swelling in his legs.  His breathing had become progressively worse with exertion and he was profoundly fatigued, which prompted his ED visit. He has taken no insulin since the summer, and no BP meds for "a long time". Was not checking blood pressures at home but was checking sugars and said his need for insulin went away.  He has used an occasional NSAID for headaches, but nothing consistent.  Had a brother with ESRD on dialysis in another state who is now deceased (he had sarcoidosis)  CREATININE, SER  Date/Time Value Ref Range Status  11/22/2014 10:05 PM 13.72* 0.50 - 1.35 mg/dL Final  11/22/2014 03:27 PM 13.66* 0.50 - 1.35 mg/dL Final  02/03/2012 03:24 AM 1.69* 0.50 - 1.35 mg/dL Final  01/31/2012 03:25 AM 1.72* 0.50 - 1.35 mg/dL Final  01/30/2012 04:00 AM 1.84* 0.50 - 1.35 mg/dL Final  01/29/2012 03:10 AM 1.98* 0.50 - 1.35 mg/dL Final  01/28/2012 11:26 PM 2.04* 0.50 - 1.35 mg/dL Final  01/28/2012 11:10 AM 2.03* 0.50 - 1.35 mg/dL Final  08/16/2011 06:25 AM 1.47* 0.50 - 1.35 mg/dL Final  08/14/2011 06:45 AM 1.23 0.50 - 1.35 mg/dL Final  08/12/2011 08:08 PM 1.15 0.50 - 1.35  mg/dL Final  08/12/2011 05:00 AM 1.07 0.50 - 1.35 mg/dL Final  08/11/2011 06:20 AM 1.11 0.50 - 1.35 mg/dL Final  08/10/2011 03:42 PM 1.37* 0.50 - 1.35 mg/dL Final  06/19/2008 11:15 AM 1.30  Final    Past Medical History  Diagnosis Date  . Diabetes mellitus   . HTN (hypertension)   . Osteomyelitis     Past Surgical History:  Past Surgical History  Procedure Laterality Date  . Left 4th toe amputation    . Cholecystectomy    . Tonsillectomy    . I&d extremity  01/28/2012    Procedure: IRRIGATION AND DEBRIDEMENT EXTREMITY;  Surgeon: Paulene Floor, MD;  Location: [facility];  Service: Orthopedics;  Laterality: Left;  irrigation and drainage left hand  . I&d extremity  01/30/2012    Procedure: IRRIGATION AND DEBRIDEMENT EXTREMITY;  Surgeon: Paulene Floor, MD;  Location: [facility];  Service: Orthopedics;  Laterality: Left;  flexor teno synovectomy and extensor tenosynovectomy arthrosynovectomy wristjoint  . I&d extremity  02/01/2012    Procedure: IRRIGATION AND DEBRIDEMENT EXTREMITY;  Surgeon: Paulene Floor, MD;  Location: [facility];  Service: Orthopedics;  Laterality: Left;  Left Wrist, Hand and Forearm     Family History  Problem Relation Age of Onset  . Heart disease Mother   . Diabetes Father   . Diabetes Brother   .  Diabetes Sister   . Diabetes Brother   . Diabetes Brother   Brother had ESRD and was on HD prior to his death (in another state - had sarcoid) Multiple sibs with DM.  Social History:  reports that he has quit smoking. His smoking use included Cigarettes. He has a 2 pack-year smoking history. He has never used smokeless tobacco. He reports that he does not drink alcohol or use illicit drugs. Married for 36 years. Supportive wife. 3 children.   Allergies  Allergen Reactions  . Penicillins Hives and Swelling    Home medications: Prior to Admission medications   Medication Sig Start Date End Date Taking? Authorizing Provider  insulin NPH-insulin regular  (NOVOLIN 70/30) (70-30) 100 UNIT/ML injection Inject 15 Units into the skin 2 (two) times daily with a meal.   Yes Historical Provider, MD  amLODipine (NORVASC) 10 MG tablet Take 1 tablet (10 mg total) by mouth daily. Patient not taking: Reported on 11/22/2014 02/04/12 02/03/13  Sheila Oats, MD  aspirin 81 MG tablet Take 81 mg by mouth daily.    Historical Provider, MD    Inpatient medications: . sodium chloride  10 mL/hr Intravenous Once  . amLODipine  10 mg Oral Daily  . docusate sodium  100 mg Oral BID  . insulin aspart  0-5 Units Subcutaneous QHS  . insulin aspart  0-9 Units Subcutaneous TID WC  . pantoprazole  40 mg Oral Q1200  . sodium chloride  3 mL Intravenous Q12H  . sodium chloride  3 mL Intravenous Q12H    Review of Systems Gen:  + headaches, + chills (feels cold when everyone else is warm) + sleep disturbance HEENT:  No visual change, + sore throat for a week Resp:  + DOE, cough, "burning" in his chest Cardiac:  + chest burning as above  + leg edema for a few months GI:   Denies abdominal pain.  + early AM nausea for several months with weight loss, poor po intake GU:  Denies difficulty or change in voiding.  No change in urine color.     MS:  Denies joint pain or swelling.   Derm:  Denies skin rash or itching.  No chronic skin conditions.  Neuro:   Denies cognitive issues but wife says "flatter" and less engaging of lat Psych:  Denies symptoms of depression of anxiety.  No hallucination.     Physical Exam:  BP 153/80 mmHg  Pulse 76  Temp(Src) 99.3 F (37.4 C) (Oral)  Resp 17  Ht 6\' 1"  (1.854 m)  SpO2 100% Currently undergoing second unit PRBC's Gen: Very nice soft spoken AAM NAD Skin: no rash, cyanosis Neck: + JVD but no neck bruits Chest: Crackles at luing bases Heart: regular, S1S2 No S3 1/6 murmur USB No diastolic murmur and no pericardial frictionrub. Abdomen: soft, + BS, Liver edge not felt.  No masses or tenderness Ext: 1+ pretibial edema  bilaterally Neuro: alert, Ox3. Moderate left>R hand tremor and + mild asterixus    Recent Labs Lab 11/22/14 1527 11/22/14 2205  NA 136* 134*  K 5.5* 5.3  CL 103 101  CO2 11* 12*  GLUCOSE 142* 103*  BUN 107* 108*  CREATININE 13.66* 13.72*  CALCIUM 8.6 8.1*     Recent Labs Lab 11/22/14 1527  WBC 9.1  HGB 5.3*  HCT 16.4*  MCV 79.6  PLT 373    Recent Labs Lab 11/22/14 1704 11/22/14 2205  TROPONINI <0.30 <0.30     Recent Labs Lab  11/22/14 1507 11/22/14 2147 11/23/14 0052  GLUCAP 129* 94 115*   :  Recent Labs Lab 11/22/14 1704  IRON 16*  TIBC 253  FERRITIN 45    Xrays/Other Studies: Dg Chest 2 View (if Patient Has Fever And/or Copd)  11/22/2014   CLINICAL DATA:  Sternal chest pain and dizziness for 2 days, shortness of breath for a month, sore throat since last week, chills since this summer, personal history of hypertension, diabetes, former smoker  EXAM: CHEST  2 VIEW  COMPARISON:  01/28/2012  FINDINGS: Enlargement of cardiac silhouette with borderline vascular congestion.  Mediastinal contours normal.  Lungs clear.  No infiltrate, pleural effusion or pneumothorax.  Mild biconvex thoracolumbar scoliosis without acute osseous findings.  IMPRESSION: Enlargement of cardiac silhouette.  No acute abnormalities.   Electronically Signed   By: Lavonia Dana M.D.   On: 11/22/2014 16:09   Ct Head Wo Contrast  11/23/2014   CLINICAL DATA:  63 year old male with intermittent visual changes  EXAM: CT HEAD WITHOUT CONTRAST  TECHNIQUE: Contiguous axial images were obtained from the base of the skull through the vertex without intravenous contrast.  COMPARISON:  None  FINDINGS: Negative for acute intracranial hemorrhage, acute infarction, mass, mass effect, hydrocephalus or midline shift. Gray-white differentiation is preserved throughout. Very mild atrophy. Mild periventricular and subcortical white matter hypoattenuation most consistent with the sequelae of longstanding  microvascular ischemic white matter disease. No focal soft tissue or calvarial abnormality. No focal global abnormality. The orbits are symmetric. Normal aeration of the mastoid air cells and visualized paranasal sinuses. Trace atherosclerotic calcification in the bilateral cavernous carotid arteries.  IMPRESSION: 1. No acute intracranial abnormality. 2. 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 3. Intracranial atherosclerosis.   Electronically Signed   By: Jacqulynn Cadet M.D.   On: 11/23/2014 03:08   Background 63 y.o. year-old AAM with background of DM, HTN and CKD (was seen at [facility] "once in the past but I didn't go back because I didn't have insurance" where he was told at that time he had proteinuria and diabetic nephropathy.  Has not seen a physician in 3 years, presented tot he ED at [facility] with SOB, fatigue, burning in his chest, nausea.  In the ED at [facility], his creatinine was 13.7, bicarb 11, BUN 107, K 5.5 and anemic with Hb of 5.3 and iron deficient. He was transferred to [facility] for possible need for HD. Clinical history, ROS and lab presentation all consistent with ESRD. Dialysis will be initiated.  Impression/Plan  1. Renal failure - known CKD and told in past he had diabetic nephropathy but no followup because no insurance.  Clinical presentation c/w ESRD with profound anemia, metabolic acidosis, hyperkalemia, and classic symptom complex of some duration. Will arrange for [facility] and initiation of HD. Vein mapping. VVS consult. Financial counselor will need to see (uninsured). After transfusion done, will try to have Jessie placed by IR today so patient can get his first dialysis treatment today and then a second treatment tomorrow. 2. Anemia - CKD + Fe deficiency. Currently undergoing transfusion. Replete iron. Start Aranesp. Hemoccult stools 3. CKD-MBD - check PTH and phosphorus 4. HTN - Meds/volume control 5. DM - has not used any insulin or any hypoglycemic meds  for some time. Check A1C. Reduced renal insulin clearance probably accounts for the improvement in BS's he noted in the past 6 months. 6. Dyspnea - likely related to his severe anemia and volume issues 7. H/o toe amputation in the  past   [name],  MD [facility] [phone] pager 11/23/2014, 7:55 AM

## 2014-11-23 NOTE — Progress Notes (Addendum)
PROGRESS NOTE  [name] [id number] DOB: 03/27/1951 DOA: 11/22/2014 PCP: [name], MD  Brief history 63 year old male with history of diabetes mellitus, CKD, iron deficiency anemia, hypertension presented with 1 month history of chest discomfort and shortness of breath. The patient has not seen a physician for nearly 2 years. Upon presentation, he was found to have a serum creatinine of 13.72 and hemoglobin of 5.3. The patient stated that he has only been taking his 70/30 insulin on a when necessary basis when his CBGs are greater than 150. He states that he is only been checking his CBGs once daily. The patient stated that he has been having increasing dyspnea on exertion with intermittent chest discomfort. He denied any syncope, nausea, vomiting, diarrhea. He denies any NSAID use. He denies any hematemesis, hematochezia, melena, hematuria. The patient states that he has not taken any antihypertensive medications for at least 6 months. Initial workup revealed chest x-ray that revealed some chronic interstitial changes. CT of the brain was negative. Assessment/Plan: Acute on chronic renal failure -Appreciate nephrology/Dr. Lorrene Reid -plans noted to initiate dialysis -plans noted for venous mapping -renal ultrasound Symptomatic anemia/history of iron deficiency anemia -The patient has never had endoscopy/colonoscopy -He has not taken iron for at least a year--history of iron deficiency anemia -I have performed a rectal exam--brown stool noted, Hemoccult-positive -I have consulted gastroenterology--spoke with Dr. 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 -Restart  amlodipine -Echocardiogram Dyspnea on exertion  -Certainly, this is likely from the patient's symptomatic anemia  -Echocardiogram  -presently stable on RA  Family Communication:   Wife updated at beside--total time 13min, >50% spent counseling pt and coordinating care Disposition Plan:   Home when medically stable       Procedures/Studies: Dg Chest 2 View (if Patient Has Fever And/or Copd)  11/22/2014   CLINICAL DATA:  Sternal chest pain and dizziness for 2 days, shortness of breath for a month, sore throat since last week, chills since this summer, personal history of hypertension, diabetes, former smoker  EXAM: CHEST  2 VIEW  COMPARISON:  01/28/2012  FINDINGS: Enlargement of cardiac silhouette with borderline vascular congestion.  Mediastinal contours normal.  Lungs clear.  No infiltrate, pleural effusion or pneumothorax.  Mild biconvex thoracolumbar scoliosis without acute osseous findings.  IMPRESSION: Enlargement of cardiac silhouette.  No acute abnormalities.   Electronically Signed   By: Lavonia Dana M.D.   On: 11/22/2014 16:09   Ct Head Wo Contrast  11/23/2014   CLINICAL DATA:  63 year old male with intermittent visual changes  EXAM: CT HEAD WITHOUT CONTRAST  TECHNIQUE: Contiguous axial images were obtained from the base of the skull through the vertex without intravenous contrast.  COMPARISON:  None  FINDINGS: Negative for acute intracranial hemorrhage, acute infarction, mass, mass effect, hydrocephalus or midline shift. Gray-white differentiation is preserved throughout. Very mild atrophy. Mild periventricular and subcortical white matter hypoattenuation most consistent with the sequelae of longstanding microvascular ischemic white matter disease. No focal soft tissue or calvarial abnormality. No focal global abnormality. The orbits are symmetric. Normal aeration of the mastoid air cells and visualized paranasal sinuses. Trace atherosclerotic calcification in the bilateral cavernous  carotid arteries.  IMPRESSION: 1. No acute intracranial abnormality. 2. Mild atrophy and chronic microvascular  ischemic white matter disease. 3. Intracranial atherosclerosis.   Electronically Signed   By: Jacqulynn Cadet M.D.   On: 11/23/2014 03:08         Subjective: Patient denies fevers, chills, headache, chest pain, dyspnea, nausea, vomiting, diarrhea, abdominal pain, dysuria, hematuria   Objective: Filed Vitals:   11/23/14 0111 11/23/14 0407 11/23/14 0555 11/23/14 0630  BP: 174/94 143/75 144/77 153/80  Pulse: 86 78 78 76  Temp: 98.6 F (37 C) 99.5 F (37.5 C) 98 F (36.7 C) 99.3 F (37.4 C)  TempSrc: Oral Oral Oral Oral  Resp: 18 17 17 17   Height: 6\' 1"  (1.854 m)     SpO2: 100% 97% 99% 100%    Intake/Output Summary (Last 24 hours) at 11/23/14 0836 Last data filed at 11/23/14 0555  Gross per 24 hour  Intake  837.5 ml  Output      0 ml  Net  837.5 ml   Weight change:  Exam:   General:  Pt is alert, follows commands appropriately, not in acute distress  HEENT: No icterus, No thrush,  /AT  Cardiovascular: RRR, S1/S2, no rubs, no gallops  Respiratory: Bibasilar crackles. No wheezing. Good air movement   Abdomen: Soft/+BS, non tender, non distended, no guarding  Extremities: trace edema, No lymphangitis, No petechiae, No rashes, no synovitis  Data Reviewed: Basic Metabolic Panel:  Recent Labs Lab 11/22/14 1527 11/22/14 2205  NA 136* 134*  K 5.5* 5.3  CL 103 101  CO2 11* 12*  GLUCOSE 142* 103*  BUN 107* 108*  CREATININE 13.66* 13.72*  CALCIUM 8.6 8.1*   Liver Function Tests: No results for input(s): AST, ALT, ALKPHOS, BILITOT, PROT, ALBUMIN in the last 168 hours. No results for input(s): LIPASE, AMYLASE in the last 168 hours. No results for input(s): AMMONIA in the last 168 hours. CBC:  Recent Labs Lab 11/22/14 1527  WBC 9.1  HGB 5.3*  HCT 16.4*  MCV 79.6  PLT 373   Cardiac Enzymes:  Recent Labs Lab 11/22/14 1704 11/22/14 2205   TROPONINI <0.30 <0.30   BNP: Invalid input(s): POCBNP CBG:  Recent Labs Lab 11/22/14 1507 11/22/14 2147 11/23/14 0052  GLUCAP 129* 94 115*    No results found for this or any previous visit (from the past 240 hour(s)).   Scheduled Meds: . sodium chloride  10 mL/hr Intravenous Once  . amLODipine  10 mg Oral Daily  . docusate sodium  100 mg Oral BID  . insulin aspart  0-5 Units Subcutaneous QHS  . insulin aspart  0-9 Units Subcutaneous TID WC  . pantoprazole  40 mg Oral Q1200  . sodium chloride  3 mL Intravenous Q12H  . sodium chloride  3 mL Intravenous Q12H   Continuous Infusions:    Ashaya Raftery, DO  [facility] Pager [phone]  If 7PM-7AM, please contact night-coverage [email or web address] Password [email or web address] 11/23/2014, 8:36 AM   LOS: 1 day

## 2014-11-23 NOTE — Progress Notes (Signed)
Hemodialysis- Initiated without issue. Placement confirmed prior to cath use. H&H drawn, given order parameters for transfusion per Amalia Hailey PA. Tubes sent, awaiting results. Continue to monitor patient.

## 2014-11-23 NOTE — Progress Notes (Signed)
CRITICAL VALUE ALERT  Critical value received:  hgb 6.2  Date of notification:  11/28  Time of notification:  1705  Critical value read back:yes  Nurse who received alert: Louretta Shorten  MD notified (1st page):  Alric Seton PA  Time of first page:  Order parameters for transfusion transcribed on hemodialysis orders:  Hgb>7.5 no transfusion Hgb 7-7.4 1 unit Hgb <7 2 units  MD notified (2nd page):  Time of second page:  Responding MD:  Reinaldo Meeker  Time MD responded: na  Will transfuse 2 units on HD tonight per order

## 2014-11-23 NOTE — Progress Notes (Signed)
New Admission Note:   Arrival Method: via carelink Mental Orientation:A&Ox4 Telemetry:  Assessment: Completed Skin: healing ulcers on bottom of right foot IV: NS@10  Pain: NONE Tubes: N/A Safety Measures: Safety Fall Prevention Plan has been given, discussed and signed Admission: Completed [address] Orientation: Patient has been orientated to the room, unit and staff.  Family: Wife at bedside  Orders have been reviewed and implemented. Will continue to monitor the patient. Call light has been placed within reach and bed alarm has been activated.   Dudley Major BSN, Consulting civil engineer number: [phone]

## 2014-11-23 NOTE — Consult Note (Signed)
Consult for [facility]  Reason for Consult: Anemia and Heme positive stool Referring Physician: [facility]  [name] HPI: This is a 63 year old male admitted for chest pain and SOB.  He has a history of medical noncompliance with his DM and HTN.  He states that he has had sharp chest pain and worsening SOB over the past month.  As a result of his symptoms he presented to the ER for further evaluation.  He was noted to have an HGB of 5.3 g/dL and a creatinine of 13.  His last blood work was in in 2013 reported a creatinine of 1.6, but it did fluctuate into the 2 range.  Additionally, his HGB averaged in the 8-9 range, but it was as high as 12.0 in 2009.  The urinalysis did reveal hematuria and he was noted to be heme positive with brown stool.  No prior EGD/Colonoscopy and there is no family history of colon cancer.  The patient denies any routine NSAID use.  Past Medical History  Diagnosis Date  . Diabetes mellitus   . HTN (hypertension)   . Osteomyelitis     Past Surgical History  Procedure Laterality Date  . Left 4th toe amputation    . Cholecystectomy    . Tonsillectomy    . I&d extremity  01/28/2012    Procedure: IRRIGATION AND DEBRIDEMENT EXTREMITY;  Surgeon: Paulene Floor, MD;  Location: [facility];  Service: Orthopedics;  Laterality: Left;  irrigation and drainage left hand  . I&d extremity  01/30/2012    Procedure: IRRIGATION AND DEBRIDEMENT EXTREMITY;  Surgeon: Paulene Floor, MD;  Location: [facility];  Service: Orthopedics;  Laterality: Left;  flexor teno synovectomy and extensor tenosynovectomy arthrosynovectomy wristjoint  . I&d extremity  02/01/2012    Procedure: IRRIGATION AND DEBRIDEMENT EXTREMITY;  Surgeon: Paulene Floor, MD;  Location: [facility];  Service: Orthopedics;  Laterality: Left;  Left Wrist, Hand and Forearm    Family History  Problem Relation Age of Onset  . Heart disease Mother   . Diabetes Father   . Diabetes Brother   . Diabetes Sister    . Diabetes Brother   . Diabetes Brother     Social History:  reports that he has quit smoking. His smoking use included Cigarettes. He has a 2 pack-year smoking history. He has never used smokeless tobacco. He reports that he does not drink alcohol or use illicit drugs.  Allergies:  Allergies  Allergen Reactions  . Penicillins Hives and Swelling    Medications:  Scheduled: . sodium chloride  10 mL/hr Intravenous Once  . amLODipine  10 mg Oral Daily  . docusate sodium  100 mg Oral BID  . ferric gluconate (FERRLECIT/NULECIT) IV  125 mg Intravenous Daily  . insulin aspart  0-5 Units Subcutaneous QHS  . insulin aspart  0-9 Units Subcutaneous TID WC  . pantoprazole  40 mg Oral Q1200  . sodium chloride  3 mL Intravenous Q12H  . sodium chloride  3 mL Intravenous Q12H   Continuous:   Results for orders placed or performed during the hospital encounter of 11/22/14 (from the past 24 hour(s))  CBG monitoring, ED     Status: Abnormal   Collection Time: 11/22/14  3:07 PM  Result Value Ref Range   Glucose-Capillary 129 (H) 70 - 99 mg/dL  Basic metabolic panel     Status: Abnormal   Collection Time: 11/22/14  3:27 PM  Result Value Ref Range  Sodium 136 (L) 137 - 147 mEq/L   Potassium 5.5 (H) 3.7 - 5.3 mEq/L   Chloride 103 96 - 112 mEq/L   CO2 11 (L) 19 - 32 mEq/L   Glucose, Bld 142 (H) 70 - 99 mg/dL   BUN 107 (H) 6 - 23 mg/dL   Creatinine, Ser 13.66 (H) 0.50 - 1.35 mg/dL   Calcium 8.6 8.4 - 10.5 mg/dL   GFR calc non Af Amer 3 (L) >90 mL/min   GFR calc Af Amer 4 (L) >90 mL/min   Anion gap 22 (H) 5 - 15  CBC     Status: Abnormal   Collection Time: 11/22/14  3:27 PM  Result Value Ref Range   WBC 9.1 4.0 - 10.5 K/uL   RBC 2.06 (L) 4.22 - 5.81 MIL/uL   Hemoglobin 5.3 (LL) 13.0 - 17.0 g/dL   HCT 16.4 (L) 39.0 - 52.0 %   MCV 79.6 78.0 - 100.0 fL   MCH 25.7 (L) 26.0 - 34.0 pg   MCHC 32.3 30.0 - 36.0 g/dL   RDW 17.6 (H) 11.5 - 15.5 %   Platelets 373 150 - 400 K/uL  I-stat  troponin, ED (not at [facility])     Status: None   Collection Time: 11/22/14  3:50 PM  Result Value Ref Range   Troponin i, poc 0.05 0.00 - 0.08 ng/mL   Comment 3          Antistreptolysin O titer     Status: None   Collection Time: 11/22/14  4:40 PM  Result Value Ref Range   ASO 75 <409 IU/mL  Vitamin B12     Status: None   Collection Time: 11/22/14  5:04 PM  Result Value Ref Range   Vitamin B-12 713 211 - 911 pg/mL  Folate     Status: None   Collection Time: 11/22/14  5:04 PM  Result Value Ref Range   Folate 12.8 ng/mL  Iron and TIBC     Status: Abnormal   Collection Time: 11/22/14  5:04 PM  Result Value Ref Range   Iron 16 (L) 42 - 135 ug/dL   TIBC 253 215 - 435 ug/dL   Saturation Ratios 6 (L) 20 - 55 %   UIBC 237 125 - 400 ug/dL  Ferritin     Status: None   Collection Time: 11/22/14  5:04 PM  Result Value Ref Range   Ferritin 45 22 - 322 ng/mL  Reticulocytes     Status: Abnormal   Collection Time: 11/22/14  5:04 PM  Result Value Ref Range   Retic Ct Pct 2.5 0.4 - 3.1 %   RBC. 1.89 (L) 4.22 - 5.81 MIL/uL   Retic Count, Manual 47.3 19.0 - 186.0 K/uL  Type and screen     Status: None (Preliminary result)   Collection Time: 11/22/14  5:04 PM  Result Value Ref Range   ABO/RH(D) O POS    Antibody Screen NEG    Sample Expiration 11/25/2014    Unit Number [id number]    Blood Component Type RED CELLS,LR    Unit division 00    Status of Unit ISSUED    Transfusion Status OK TO TRANSFUSE    Crossmatch Result Compatible    Unit Number [id number]    Blood Component Type RED CELLS,LR    Unit division 00    Status of Unit ALLOCATED    Transfusion Status OK TO TRANSFUSE    Crossmatch Result Compatible   Save smear  Status: None   Collection Time: 11/22/14  5:04 PM  Result Value Ref Range   Smear Review SMEAR STAINED AND AVAILABLE FOR REVIEW   Troponin I     Status: None   Collection Time: 11/22/14  5:04 PM  Result Value Ref Range   Troponin I <0.30 <0.30 ng/mL  Pro b  natriuretic peptide     Status: Abnormal   Collection Time: 11/22/14  5:09 PM  Result Value Ref Range   Pro B Natriuretic peptide (BNP) 24651.0 (H) 0 - 125 pg/mL  Urinalysis with microscopic     Status: Abnormal   Collection Time: 11/22/14  5:40 PM  Result Value Ref Range   Color, Urine YELLOW YELLOW   APPearance CLEAR CLEAR   Specific Gravity, Urine 1.011 1.005 - 1.030   pH 5.5 5.0 - 8.0   Glucose, UA 100 (A) NEGATIVE mg/dL   Hgb urine dipstick MODERATE (A) NEGATIVE   Bilirubin Urine NEGATIVE NEGATIVE   Ketones, ur NEGATIVE NEGATIVE mg/dL   Protein, ur 100 (A) NEGATIVE mg/dL   Urobilinogen, UA 0.2 0.0 - 1.0 mg/dL   Nitrite NEGATIVE NEGATIVE   Leukocytes, UA NEGATIVE NEGATIVE   WBC, UA 0-2 <3 WBC/hpf   RBC / HPF 0-2 <3 RBC/hpf   Squamous Epithelial / LPF RARE RARE  Creatinine, urine, random     Status: None   Collection Time: 11/22/14  5:40 PM  Result Value Ref Range   Creatinine, Urine 81.0 mg/dL  Prepare RBC     Status: None   Collection Time: 11/22/14  6:17 PM  Result Value Ref Range   Order Confirmation ORDER PROCESSED BY BLOOD BANK   ABO/Rh     Status: None   Collection Time: 11/22/14  6:43 PM  Result Value Ref Range   ABO/RH(D) O POS   CBG monitoring, ED     Status: None   Collection Time: 11/22/14  9:47 PM  Result Value Ref Range   Glucose-Capillary 94 70 - 99 mg/dL  Basic metabolic panel     Status: Abnormal   Collection Time: 11/22/14 10:05 PM  Result Value Ref Range   Sodium 134 (L) 137 - 147 mEq/L   Potassium 5.3 3.7 - 5.3 mEq/L   Chloride 101 96 - 112 mEq/L   CO2 12 (L) 19 - 32 mEq/L   Glucose, Bld 103 (H) 70 - 99 mg/dL   BUN 108 (H) 6 - 23 mg/dL   Creatinine, Ser 13.72 (H) 0.50 - 1.35 mg/dL   Calcium 8.1 (L) 8.4 - 10.5 mg/dL   GFR calc non Af Amer 3 (L) >90 mL/min   GFR calc Af Amer 4 (L) >90 mL/min   Anion gap 21 (H) 5 - 15  Troponin I (q 6hr x 3)     Status: None   Collection Time: 11/22/14 10:05 PM  Result Value Ref Range   Troponin I <0.30 <0.30  ng/mL  Glucose, capillary     Status: Abnormal   Collection Time: 11/23/14 12:52 AM  Result Value Ref Range   Glucose-Capillary 115 (H) 70 - 99 mg/dL  Prepare RBC     Status: None   Collection Time: 11/23/14  2:00 AM  Result Value Ref Range   Order Confirmation ORDER PROCESSED BY BLOOD BANK   Type and screen     Status: None (Preliminary result)   Collection Time: 11/23/14  3:18 AM  Result Value Ref Range   ABO/RH(D) O POS    Antibody Screen NEG  Sample Expiration 11/26/2014    Unit Number [id number]    Blood Component Type RED CELLS,LR    Unit division 00    Status of Unit ISSUED    Transfusion Status OK TO TRANSFUSE    Crossmatch Result Compatible   ABO/Rh     Status: None   Collection Time: 11/23/14  3:18 AM  Result Value Ref Range   ABO/RH(D) O POS   Troponin I (q 6hr x 3) *Canceled*     Status: None ()   Collection Time: 11/23/14  3:56 AM   Narrative   LIS Cancel (ORR/DE = Data Error)  Magnesium *Canceled*     Status: None ()   Collection Time: 11/23/14  5:00 AM   Narrative   LIS Cancel (ORR/DE = Data Error)  Phosphorus *Canceled*     Status: None ()   Collection Time: 11/23/14  5:00 AM   Narrative   LIS Cancel (ORR/DE = Data Error)  TSH *Canceled*     Status: None ()   Collection Time: 11/23/14  5:00 AM   Narrative   LIS Cancel (ORR/DE = Data Error)  Comprehensive metabolic panel *Canceled*     Status: None ()   Collection Time: 11/23/14  5:00 AM   Narrative   LIS Cancel (ORR/DE = Data Error)  CBC *Canceled*     Status: None ()   Collection Time: 11/23/14  5:00 AM   Narrative   LIS Cancel (ORR/DE = Data Error)  Troponin I (q 6hr x 3) *Canceled*     Status: None ()   Collection Time: 11/23/14  9:56 AM   Narrative   LIS Cancel (ORR/DE = Data Error)     Dg Chest 2 View (if Patient Has Fever And/or Copd)  11/22/2014   CLINICAL DATA:  Sternal chest pain and dizziness for 2 days, shortness of breath for a month, sore throat since last week, chills since  this summer, personal history of hypertension, diabetes, former smoker  EXAM: CHEST  2 VIEW  COMPARISON:  01/28/2012  FINDINGS: Enlargement of cardiac silhouette with borderline vascular congestion.  Mediastinal contours normal.  Lungs clear.  No infiltrate, pleural effusion or pneumothorax.  Mild biconvex thoracolumbar scoliosis without acute osseous findings.  IMPRESSION: Enlargement of cardiac silhouette.  No acute abnormalities.   Electronically Signed   By: Lavonia Dana M.D.   On: 11/22/2014 16:09   Ct Head Wo Contrast  11/23/2014   CLINICAL DATA:  63 year old male with intermittent visual changes  EXAM: CT HEAD WITHOUT CONTRAST  TECHNIQUE: Contiguous axial images were obtained from the base of the skull through the vertex without intravenous contrast.  COMPARISON:  None  FINDINGS: Negative for acute intracranial hemorrhage, acute infarction, mass, mass effect, hydrocephalus or midline shift. Gray-white differentiation is preserved throughout. Very mild atrophy. Mild periventricular and subcortical white matter hypoattenuation most consistent with the sequelae of longstanding microvascular ischemic white matter disease. No focal soft tissue or calvarial abnormality. No focal global abnormality. The orbits are symmetric. Normal aeration of the mastoid air cells and visualized paranasal sinuses. Trace atherosclerotic calcification in the bilateral cavernous carotid arteries.  IMPRESSION: 1. No acute intracranial abnormality. 2. 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 3. Intracranial atherosclerosis.   Electronically Signed   By: Jacqulynn Cadet M.D.   On: 11/23/2014 03:08    ROS:  As stated above in the HPI otherwise negative.  Blood pressure 153/80, pulse 76, temperature 99.3 F (37.4 C), temperature source Oral, resp. rate 17,  height 6\' 1"  (1.854 m), SpO2 100 %.    PE: Gen: Somnolent Neck: Supple, no LAD Lungs: CTA Bilaterally CV: RRR without M/G/R ABM: Soft,  NTND, +BS Ext: No C/C/E  Assessment/Plan: 1) Anemia. 2) Heme positive stool. 3) Hematuria. 4) Renal failure.   The patient will require further evaluation with an EGD/Colonoscopy, but his renal failure takes precedence.  The plan is to start him on HD as soon as possible.  Once he is metabolic stabilized an EGD/Colonoscopy can be pursued.  Plan: 1) Dialysis per Renal. 2) Follow HGB.   Nyeemah Jennette D 11/23/2014, 9:09 AM

## 2014-11-23 NOTE — Progress Notes (Signed)
INITIAL NUTRITION ASSESSMENT  DOCUMENTATION CODES Per approved criteria  -Not Applicable   INTERVENTION: Provide Glucerna Shakes BID after meals, each supplement provides 220 kcal and 10 grams of protein. Change supplement to Nepro Shakes when patient starts on HD.  Provide a snack once daily Encourage adequate PO intake  NUTRITION DIAGNOSIS: Inadequate oral intake related to poor appetite as evidenced by pt's report of 12% weight loss in 6-8 months.   Goal: Pt to meet >/= 90% of their estimated nutrition needs   Monitor:  PO intake, weight trend, labs  Reason for Assessment: Malnutrition Screening Tool, score of 3  63 y.o. male  Admitting Dx: <principal problem not specified>  ASSESSMENT: 63 year old male admitted for chest pain and SOB. He has a history of medical noncompliance with his DM and HTN. He states that he has had sharp chest pain and worsening SOB over the past month. As a result of his symptoms he presented to the ER for further evaluation.   Per MD note, the plan is to start him on HD as soon as possible. Patient estimates that he has lost 30 lbs in the past 6-8 months; he is unsure how much he weighed 6 months ago. He reports that he was losing weight despite eating well though for the past month his appetite has been poor and he has been eating 4 snacks daily. Patient ate a small breakfast yesterday PTA and was eating his first meal at time of visit.  Patient commented on his loss of muscle, especially in his arms. Pt does appear to have moderate fat wasting in his arms and moderate muscle wasting of his thighs and patellar region.  Labs: very low hemoglobin, low sodium, low GFR, high BUN and creatinine, low calcium,   Height: Ht Readings from Last 1 Encounters:  11/23/14 6\' 1"  (1.854 m)    Weight: Wt Readings from Last 1 Encounters:  01/28/12 225 lb (102.059 kg)  11/23/14 199 lb (90.5 kg) per bed scale; unsure if accurate  Ideal Body Weight: 184  lbs  % Ideal Body Weight: 108%  Wt Readings from Last 10 Encounters:  01/28/12 225 lb (102.059 kg)    Usual Body Weight: 225 lbs  % Usual Body Weight: 88%  BMI:  Body Mass Index of 26.3 kg/(m^2) (based on today's weight)  Estimated Nutritional Needs: Kcal: 2200-2500 Protein: 70-80 grams Fluid: 1.2 L restriction per MD  Skin: intact; healing scabbed right foot diabetic ulcer  Diet Order: Diet renal W/1232mL fluid restriction  EDUCATION NEEDS: -No education needs identified at this time   Intake/Output Summary (Last 24 hours) at 11/23/14 1419 Last data filed at 11/23/14 1020  Gross per 24 hour  Intake 1172.5 ml  Output      0 ml  Net 1172.5 ml    Last BM: 11/27   Labs:   Recent Labs Lab 11/22/14 1527 11/22/14 2205  NA 136* 134*  K 5.5* 5.3  CL 103 101  CO2 11* 12*  BUN 107* 108*  CREATININE 13.66* 13.72*  CALCIUM 8.6 8.1*  GLUCOSE 142* 103*    CBG (last 3)   Recent Labs  11/22/14 2147 11/23/14 0052 11/23/14 1321  GLUCAP 94 115* 90    Scheduled Meds: . sodium chloride  10 mL/hr Intravenous Once  . amLODipine  10 mg Oral Daily  . docusate sodium  100 mg Oral BID  . fentaNYL      . ferric gluconate (FERRLECIT/NULECIT) IV  125 mg Intravenous Daily  .  heparin      . insulin aspart  0-5 Units Subcutaneous QHS  . insulin aspart  0-9 Units Subcutaneous TID WC  . lidocaine      . midazolam      . pantoprazole  40 mg Oral Q1200  . sodium chloride  3 mL Intravenous Q12H  . sodium chloride  3 mL Intravenous Q12H    Continuous Infusions:   Past Medical History  Diagnosis Date  . Diabetes mellitus   . HTN (hypertension)   . Osteomyelitis     Past Surgical History  Procedure Laterality Date  . Left 4th toe amputation    . Cholecystectomy    . Tonsillectomy    . I&d extremity  01/28/2012    Procedure: IRRIGATION AND DEBRIDEMENT EXTREMITY;  Surgeon: Paulene Floor, MD;  Location: [facility];  Service: Orthopedics;  Laterality: Left;   irrigation and drainage left hand  . I&d extremity  01/30/2012    Procedure: IRRIGATION AND DEBRIDEMENT EXTREMITY;  Surgeon: Paulene Floor, MD;  Location: [facility];  Service: Orthopedics;  Laterality: Left;  flexor teno synovectomy and extensor tenosynovectomy arthrosynovectomy wristjoint  . I&d extremity  02/01/2012    Procedure: IRRIGATION AND DEBRIDEMENT EXTREMITY;  Surgeon: Paulene Floor, MD;  Location: [facility];  Service: Orthopedics;  Laterality: Left;  Left Wrist, Hand and Forearm    Pryor Ochoa RD, LDN Inpatient Clinical Dietitian Pager: [phone] After Hours Pager: [phone]

## 2014-11-23 NOTE — Progress Notes (Signed)
Echo Lab  2D Echocardiogram completed.  [place], RDCS 11/23/2014 3:01 PM

## 2014-11-24 DIAGNOSIS — Z01810 Encounter for preprocedural cardiovascular examination: Secondary | ICD-10-CM

## 2014-11-24 DIAGNOSIS — I420 Dilated cardiomyopathy: Secondary | ICD-10-CM

## 2014-11-24 LAB — TYPE AND SCREEN
ABO/RH(D): O POS
Antibody Screen: NEGATIVE
UNIT DIVISION: 0
Unit division: 0
Unit division: 0

## 2014-11-24 LAB — RENAL FUNCTION PANEL
Albumin: 2.7 g/dL — ABNORMAL LOW (ref 3.5–5.2)
Anion gap: 17 — ABNORMAL HIGH (ref 5–15)
BUN: 67 mg/dL — ABNORMAL HIGH (ref 6–23)
CHLORIDE: 101 meq/L (ref 96–112)
CO2: 18 meq/L — AB (ref 19–32)
CREATININE: 9.76 mg/dL — AB (ref 0.50–1.35)
Calcium: 7.6 mg/dL — ABNORMAL LOW (ref 8.4–10.5)
GFR calc non Af Amer: 5 mL/min — ABNORMAL LOW (ref >90)
GFR, EST AFRICAN AMERICAN: 6 mL/min — AB (ref >90)
Glucose, Bld: 97 mg/dL (ref 70–99)
Phosphorus: 4.8 mg/dL — ABNORMAL HIGH (ref 2.3–4.6)
Potassium: 4.5 mEq/L (ref 3.7–5.3)
SODIUM: 136 meq/L — AB (ref 137–147)

## 2014-11-24 LAB — GLUCOSE, CAPILLARY
GLUCOSE-CAPILLARY: 86 mg/dL (ref 70–99)
GLUCOSE-CAPILLARY: 132 mg/dL — AB (ref 70–99)
Glucose-Capillary: 124 mg/dL — ABNORMAL HIGH (ref 70–99)

## 2014-11-24 LAB — LIPID PANEL
Cholesterol: 75 mg/dL (ref 0–200)
HDL: 22 mg/dL — ABNORMAL LOW (ref >39)
LDL CALC: 43 mg/dL (ref 0–99)
Total CHOL/HDL Ratio: 3.4 RATIO
Triglycerides: 49 mg/dL (ref <150)
VLDL: 10 mg/dL (ref 0–40)

## 2014-11-24 LAB — CBC
HEMATOCRIT: 22.7 % — AB (ref 39.0–52.0)
HEMOGLOBIN: 7.5 g/dL — AB (ref 13.0–17.0)
MCH: 26.1 pg (ref 26.0–34.0)
MCHC: 33 g/dL (ref 30.0–36.0)
MCV: 79.1 fL (ref 78.0–100.0)
Platelets: 286 10*3/uL (ref 150–400)
RBC: 2.87 MIL/uL — AB (ref 4.22–5.81)
RDW: 15.8 % — ABNORMAL HIGH (ref 11.5–15.5)
WBC: 8.4 10*3/uL (ref 4.0–10.5)

## 2014-11-24 LAB — HIV ANTIBODY (ROUTINE TESTING W REFLEX): HIV 1&2 Ab, 4th Generation: NONREACTIVE

## 2014-11-24 LAB — HEMOGLOBIN A1C
Hgb A1c MFr Bld: 5.7 % — ABNORMAL HIGH (ref <5.7)
MEAN PLASMA GLUCOSE: 117 mg/dL — AB (ref <117)

## 2014-11-24 MED ORDER — LISINOPRIL 5 MG PO TABS
5.0000 mg | ORAL_TABLET | Freq: Every day | ORAL | Status: DC
  Administered 2014-11-24 – 2014-11-30 (×5): 5 mg via ORAL
  Filled 2014-11-24 (×8): qty 1

## 2014-11-24 MED ORDER — HYDRALAZINE HCL 25 MG PO TABS
25.0000 mg | ORAL_TABLET | Freq: Three times a day (TID) | ORAL | Status: DC
  Administered 2014-11-24 – 2014-11-26 (×4): 25 mg via ORAL
  Filled 2014-11-24 (×11): qty 1

## 2014-11-24 MED ORDER — KIDNEY FAILURE BOOK
Freq: Once | Status: AC
  Administered 2014-11-24: 17:00:00
  Filled 2014-11-24: qty 1

## 2014-11-24 MED ORDER — CARVEDILOL 3.125 MG PO TABS
3.1250 mg | ORAL_TABLET | Freq: Two times a day (BID) | ORAL | Status: DC
  Filled 2014-11-24 (×2): qty 1

## 2014-11-24 MED ORDER — DARBEPOETIN ALFA 25 MCG/0.42ML IJ SOSY
25.0000 ug | PREFILLED_SYRINGE | INTRAMUSCULAR | Status: DC
  Filled 2014-11-24: qty 0.42

## 2014-11-24 MED ORDER — ISOSORBIDE MONONITRATE ER 30 MG PO TB24: 30.0000 mg | ORAL_TABLET | Freq: Every day | ORAL | Status: DC

## 2014-11-24 MED ORDER — RENA-VITE PO TABS
1.0000 | ORAL_TABLET | Freq: Every day | ORAL | Status: DC
  Administered 2014-11-24 – 2014-11-29 (×6): 1 via ORAL
  Filled 2014-11-24 (×10): qty 1

## 2014-11-24 MED ORDER — CARVEDILOL 6.25 MG PO TABS
6.2500 mg | ORAL_TABLET | Freq: Two times a day (BID) | ORAL | Status: DC
  Administered 2014-11-24 – 2014-11-25 (×2): 6.25 mg via ORAL
  Filled 2014-11-24 (×6): qty 1

## 2014-11-24 MED ORDER — ISOSORBIDE MONONITRATE ER 30 MG PO TB24
30.0000 mg | ORAL_TABLET | Freq: Every day | ORAL | Status: DC
  Administered 2014-11-25 – 2014-11-30 (×4): 30 mg via ORAL
  Filled 2014-11-24 (×7): qty 1

## 2014-11-24 NOTE — Plan of Care (Signed)
Problem: Phase I Progression Outcomes Goal: OOB as tolerated unless otherwise ordered Outcome: Completed/Met Date Met:  11/24/14     

## 2014-11-24 NOTE — Procedures (Signed)
I have personally attended this patient's dialysis session.   HD #2 today Tight heparin TDC at BFR 300 3 hour treatment planned for today UF goal 1 liter - BP high - may increase goal another 0.5 kg   [name], MD [place] Pager 11/24/2014, 10:56 AM

## 2014-11-24 NOTE — Progress Notes (Signed)
Right  Upper Extremity Vein Map    Cephalic  Segment Diameter Depth Comment  1. Axilla 63mm mm   2. Mid upper arm 70mm mm   3. Above AC 5.62mm mm   4. In AC mm mm   5. Below AC 27mm mm branch  6. Mid forearm 3.53mm mm Multiple branches  7. Wrist 3.35mm mm    mm mm    mm mm    mm mm    Basilic  Segment Diameter Depth Comment  1. Axilla 7.90mm 6.62mm   2. Mid upper arm 6.25mm 5.70mm   3. Above AC 6.44mm 5.72mm   4. In [facility] 5.59mm 2.59mm branch  5. Below AC 41mm 2.96mm   6. Mid forearm 2.1mm 2.61mm   7. Wrist 1.59mm 1.70mm    mm mm    mm mm    mm mm   Left Upper Extremity Vein Map    Cephalic  Segment Diameter Depth Comment  1. Axilla 5.12mm mm   2. Mid upper arm 32mm mm branch  3. Above AC 3.16mm mm   4. In AC 4.16mm mm   5. Below AC 5.24mm mm    6. Mid forearm  4.9mm   Thickening, branch.  7. Low forearm   mm SVT noted in cephalic in low forearm. Pt states vein blew during IV infusion  8. Wrist 92mm mm branch   mm mm    mm mm    Basilic  Segment Diameter Depth Comment  1. Axilla 5.4 mm 21 mm   2. Mid upper arm 5.5 mm 23 mm   3. Above AC  mm  mm   4. In AC 6.8 mm 15 mm tortuous  5. Below AC 2.9 mm 3 mm   6. Mid forearm 2.4 mm 2.2 mm   7. Wrist 1.7 mm 1.9 mm    mm mm    mm mm    mm mm

## 2014-11-24 NOTE — Plan of Care (Signed)
Problem: Phase I Progression Outcomes Goal: Initial discharge plan identified Outcome: Completed/Met Date Met:  11/24/14 Return home with spouse, outpt hemodialysis.

## 2014-11-24 NOTE — Plan of Care (Signed)
Problem: Phase I Progression Outcomes Goal: Dyspnea controlled at rest Outcome: Completed/Met Date Met:  11/24/14

## 2014-11-24 NOTE — Progress Notes (Addendum)
PROGRESS NOTE  [name] [id number] DOB: 11-04-1951 DOA: 11/22/2014 PCP: [name], MD  Brief history 63 year old male with history of diabetes mellitus, CKD, iron deficiency anemia, hypertension presented with 1 month history of chest discomfort and shortness of breath. The patient has not seen a physician for nearly 2 years. Upon presentation, he was found to have a serum creatinine of 13.72 and hemoglobin of 5.3. The patient stated that he has only been taking his 70/30 insulin on a when necessary basis when his CBGs are greater than 150. He states that he is only been checking his CBGs once daily. The patient stated that he has been having increasing dyspnea on exertion with intermittent chest discomfort. He denies any NSAID use. He denies any hematemesis, hematochezia, melena, hematuria. The patient states that he has not taken any antihypertensive medications for at least 6 months.  Workup revealed a positive FOBT. Gastroenterology was consulted and continues to follow the patient. Echocardiogram was performed and revealed EF 20-25% with severe pulmonary artery hypertension. As a result, cardiology was consulted and feels that the patient will  ultimately need a right and left heart catheterization. nephrology was consulted, and the temporary dialysis catheter was placed and the patient was initiated on dialysis on 11/23/2014.   Assessment/Plan: Acute on chronic renal failure/New ESRD -Appreciate nephrology/Dr. Lorrene Reid -[facility] placed and HD started on 11/23/14 -plans noted for venous mapping 11/25/14 -renal ultrasound--no hydronephrosis, R-interpole calculus Symptomatic anemia/history of iron deficiency anemia -The patient has never had endoscopy/colonoscopy -He has not taken iron for at least a year--history of iron deficiency anemia -I have performed a rectal exam--brown stool noted, Hemoccult-positive -I have consulted gastroenterology - 4 units PRBC on  11/23/14 -Iron saturation 6%, ferritin 45--will need continued iron supplementation  Diabetes mellitus type 2 -previously taking 70/30, but only on prn basis; noncompliant for some time -Hemoglobin A1c--5.7 -NovoLog sliding scale Hypertension -poorly controlled -Restarted amlodipine -Echocardiogram--EF 20-25% with severe PAP (24mmHg), diffuse HK -start carvedilol and lisinoprol Cardiomyopathy/Dyspnea on exertion  -Certainly, this is partly from from the patient's symptomatic anemia  -Echocardiogram--EF 20-25% with severe PAP (56mmHg), diffuse HK -presently stable on RA -consult cardiology--spoke with Bensimhon  Family Communication: Wife updated at beside Disposition Plan: Home when medically stable      Procedures/Studies: Dg Chest 2 View (if Patient Has Fever And/or Copd)  11/22/2014   CLINICAL DATA:  Sternal chest pain and dizziness for 2 days, shortness of breath for a month, sore throat since last week, chills since this summer, personal history of hypertension, diabetes, former smoker  EXAM: CHEST  2 VIEW  COMPARISON:  01/28/2012  FINDINGS: Enlargement of cardiac silhouette with borderline vascular congestion.  Mediastinal contours normal.  Lungs clear.  No infiltrate, pleural effusion or pneumothorax.  Mild biconvex thoracolumbar scoliosis without acute osseous findings.  IMPRESSION: Enlargement of cardiac silhouette.  No acute abnormalities.   Electronically Signed   By: Lavonia Dana M.D.   On: 11/22/2014 16:09   Ct Head Wo Contrast  11/23/2014   CLINICAL DATA:  64 year old male with intermittent visual changes  EXAM: CT HEAD WITHOUT CONTRAST  TECHNIQUE: Contiguous axial images were obtained from the base of the skull through the vertex without intravenous contrast.  COMPARISON:  None  FINDINGS: Negative for acute intracranial hemorrhage, acute infarction, mass, mass effect, hydrocephalus or midline shift. Gray-white differentiation is preserved throughout. Very mild  atrophy. Mild periventricular and subcortical white matter hypoattenuation most consistent with the sequelae  of longstanding microvascular ischemic white matter disease. No focal soft tissue or calvarial abnormality. No focal global abnormality. The orbits are symmetric. Normal aeration of the mastoid air cells and visualized paranasal sinuses. Trace atherosclerotic calcification in the bilateral cavernous carotid arteries.  IMPRESSION: 1. No acute intracranial abnormality. 2. 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 3. Intracranial atherosclerosis.   Electronically Signed   By: Jacqulynn Cadet M.D.   On: 11/23/2014 03:08   US Renal  11/23/2014   CLINICAL DATA:  Renal failure  EXAM: RENAL/URINARY TRACT ULTRASOUND COMPLETE  COMPARISON:  None.  FINDINGS: Right Kidney:  Length: 10.0 cm.  5 mm interpolar calculus.  No hydronephrosis.  Left Kidney:  Length: 9.7 cm.  No mass or hydronephrosis.  Bladder:  Within normal limits.  IMPRESSION: 5 mm interpolar right renal calculus.  No hydronephrosis.   Electronically Signed   By: Julian Hy M.D.   On: 11/23/2014 12:40   Ir Fluoro Guide Cv Line Right  11/23/2014   CLINICAL DATA:  Progressive renal insufficiency, needs long-term access for hemodialysis  EXAM: TUNNELED HEMODIALYSIS CATHETER PLACEMENT WITH ULTRASOUND AND FLUOROSCOPIC GUIDANCE  TECHNIQUE: The procedure, risks, benefits, and alternatives were explained to the patient. Questions regarding the procedure were encouraged and answered. The patient understands and consents to the procedure. As antibiotic prophylaxis, vancomycin 1 g was ordered pre-procedure and administered intravenously within one hour of incision.Patency of the right IJ vein was confirmed with ultrasound with image documentation. An appropriate skin site was determined. Region was prepped using maximum barrier technique including cap and mask, sterile gown, sterile gloves, large sterile sheet, and  Chlorhexidine as cutaneous antisepsis. The region was infiltrated locally with 1% lidocaine.  Intravenous Fentanyl and Versed were administered as conscious sedation during continuous cardiorespiratory monitoring by the radiology RN, with a total moderate sedation time of 11 minutes.  Under real-time ultrasound guidance, the right IJ vein was accessed with a 21 gauge micropuncture needle; the needle tip within the vein was confirmed with ultrasound image documentation. Needle exchanged over the 018 guidewire for transitional dilator, which allowed advancement of a Benson wire into the IVC. Over this, an MPA catheter was advanced. A Hemosplit 19 hemodialysis catheter was tunneled from the right anterior chest wall approach to the right IJ dermatotomy site. The MPA catheter was exchanged over an Amplatz wire for serial vascular dilators which allow placement of a peel-away sheath, through which the catheter was advanced under intermittent fluoroscopy, positioned with its tips in the proximal and midright atrium. Spot chest radiograph confirms good catheter position. No pneumothorax. Catheter was flushed and primed per protocol. Catheter secured externally with O Prolene sutures. The right IJ dermatotomy site was closed with Dermabond.  COMPLICATIONS: COMPLICATIONS None immediate  FLUOROSCOPY TIME:  12 seconds  COMPARISON:  None  IMPRESSION: 1. Technically successful placement of tunneled right IJ hemodialysis catheter with ultrasound and fluoroscopic guidance. Ready for routine use.  ACCESS: Remains approachable for percutaneous intervention as needed.   Electronically Signed   By: Arne Cleveland M.D.   On: 11/23/2014 14:25   Ir US Guide Vasc Access Right  11/23/2014   CLINICAL DATA:  Progressive renal insufficiency, needs long-term access for hemodialysis  EXAM: TUNNELED HEMODIALYSIS CATHETER PLACEMENT WITH ULTRASOUND AND FLUOROSCOPIC GUIDANCE  TECHNIQUE: The procedure, risks, benefits, and alternatives were  explained to the patient. Questions regarding the procedure were encouraged and answered. The patient understands and consents to the procedure. As antibiotic prophylaxis, vancomycin 1 g was ordered pre-procedure and  administered intravenously within one hour of incision.Patency of the right IJ vein was confirmed with ultrasound with image documentation. An appropriate skin site was determined. Region was prepped using maximum barrier technique including cap and mask, sterile gown, sterile gloves, large sterile sheet, and Chlorhexidine as cutaneous antisepsis. The region was infiltrated locally with 1% lidocaine.  Intravenous Fentanyl and Versed were administered as conscious sedation during continuous cardiorespiratory monitoring by the radiology RN, with a total moderate sedation time of 11 minutes.  Under real-time ultrasound guidance, the right IJ vein was accessed with a 21 gauge micropuncture needle; the needle tip within the vein was confirmed with ultrasound image documentation. Needle exchanged over the 018 guidewire for transitional dilator, which allowed advancement of a Benson wire into the IVC. Over this, an MPA catheter was advanced. A Hemosplit 19 hemodialysis catheter was tunneled from the right anterior chest wall approach to the right IJ dermatotomy site. The MPA catheter was exchanged over an Amplatz wire for serial vascular dilators which allow placement of a peel-away sheath, through which the catheter was advanced under intermittent fluoroscopy, positioned with its tips in the proximal and midright atrium. Spot chest radiograph confirms good catheter position. No pneumothorax. Catheter was flushed and primed per protocol. Catheter secured externally with O Prolene sutures. The right IJ dermatotomy site was closed with Dermabond.  COMPLICATIONS: COMPLICATIONS None immediate  FLUOROSCOPY TIME:  12 seconds  COMPARISON:  None  IMPRESSION: 1. Technically successful placement of tunneled right IJ  hemodialysis catheter with ultrasound and fluoroscopic guidance. Ready for routine use.  ACCESS: Remains approachable for percutaneous intervention as needed.   Electronically Signed   By: Arne Cleveland M.D.   On: 11/23/2014 14:25         Subjective: Pt is feeling better, less nausea today.  Feeling like he has more energy, less sob.  Denies f/c/ cp, n/v/d/abd pain.  No HA or dizziness  Objective: Filed Vitals:   11/24/14 0958 11/24/14 1003 11/24/14 1030 11/24/14 1100  BP: 175/90 175/93 180/93 165/93  Pulse: 80 80 80 74  Temp: 98.1 F (36.7 C)     TempSrc: Oral     Resp: 18     Height:      Weight: 87.4 kg (192 lb 10.9 oz)     SpO2: 99%       Intake/Output Summary (Last 24 hours) at 11/24/14 1114 Last data filed at 11/24/14 0449  Gross per 24 hour  Intake   1270 ml  Output   1799 ml  Net   -529 ml   Weight change:  Exam:   General:  Pt is alert, follows commands appropriately, not in acute distress  HEENT: No icterus, No thrush, , [place]/AT  Cardiovascular: RRR, +S3  Respiratory: CTA bilaterally, no wheezing, no crackles, no rhonchi  Abdomen: Soft/+BS, non tender, non distended, no guarding  Extremities: No edema, No lymphangitis, No petechiae, No rashes, no synovitis  Data Reviewed: Basic Metabolic Panel:  Recent Labs Lab 11/22/14 1527 11/22/14 2205 11/23/14 1355 11/24/14 0407  NA 136* 134* 137 136*  K 5.5* 5.3 4.9 4.5  CL 103 101 103 101  CO2 11* 12* 13* 18*  GLUCOSE 142* 103* 111* 97  BUN 107* 108* 101* 67*  CREATININE 13.66* 13.72* 13.04* 9.76*  CALCIUM 8.6 8.1* 8.0* 7.6*  PHOS  --   --  7.6* 4.8*   Liver Function Tests:  Recent Labs Lab 11/23/14 1355 11/24/14 0407  ALBUMIN 3.0* 2.7*   No results for input(s): LIPASE,  AMYLASE in the last 168 hours. No results for input(s): AMMONIA in the last 168 hours. CBC:  Recent Labs Lab 11/22/14 1527 11/23/14 1634 11/23/14 2120 11/24/14 0407  WBC 9.1  --  9.0 8.4  HGB 5.3* 6.2* 7.8* 7.5*   HCT 16.4* 19.2* 23.2* 22.7*  MCV 79.6  --  78.9 79.1  PLT 373  --  290 286   Cardiac Enzymes:  Recent Labs Lab 11/22/14 1704 11/22/14 2205 11/23/14 1355  TROPONINI <0.30 <0.30 <0.30   BNP: Invalid input(s): POCBNP CBG:  Recent Labs Lab 11/22/14 2147 11/23/14 0052 11/23/14 1321 11/23/14 2122 11/24/14 0745  GLUCAP 94 115* 90 157* 86    Recent Results (from the past 240 hour(s))  Urine culture     Status: None   Collection Time: 11/22/14  5:40 PM  Result Value Ref Range Status   Specimen Description URINE, RANDOM  Final   Special Requests NONE  Final   Culture  Setup Time   Final    11/22/2014 21:38 Performed at [place] Performed at [facility]   Final   Culture NO GROWTH Performed at [facility]   Final   Report Status 11/23/2014 FINAL  Final     Scheduled Meds: . sodium chloride  10 mL/hr Intravenous Once  . sodium chloride   Intravenous Once  . sodium chloride   Intravenous Once  . amLODipine  10 mg Oral Daily  . carvedilol  3.125 mg Oral BID WC  . [START ON 11/25/2014] darbepoetin (ARANESP) injection - DIALYSIS  25 mcg Intravenous Q Mon-HD  . docusate sodium  100 mg Oral BID  . feeding supplement (GLUCERNA SHAKE)  237 mL Oral BID WC  . ferric gluconate (FERRLECIT/NULECIT) IV  125 mg Intravenous Daily  . insulin aspart  0-5 Units Subcutaneous QHS  . insulin aspart  0-9 Units Subcutaneous TID WC  . multivitamin  1 tablet Oral QHS  . pantoprazole  40 mg Oral Q1200  . sodium chloride  3 mL Intravenous Q12H  . sodium chloride  3 mL Intravenous Q12H   Continuous Infusions:    Rigdon Macomber, DO  [facility] Pager [phone]  If 7PM-7AM, please contact night-coverage [email or web address] Password [email or web address] 11/24/2014, 11:14 AM   LOS: 2 days

## 2014-11-24 NOTE — Plan of Care (Signed)
Problem: Phase I Progression Outcomes Goal: Hemodynamically stable Outcome: Completed/Met Date Met:  11/24/14

## 2014-11-24 NOTE — Consult Note (Signed)
CARDIOLOGY  CONSULT NOTE  Patient ID: [name], MRN: [id number], DOB/AGE: Jan 17, 1951 63 y.o. Admit date: 11/22/2014 Date of Consult: 11/24/2014  Primary Physician: [name], MD Primary Cardiologist: NEW  Chief Complaint: ly DYSFUNCTION    HPI [name] is a 63 y.o. male  ADMITTED 11/27 with 1 month of progressive SOB and chest pains, edema ; Hehas had problems for last 3-4 months.  Also with exercise lightheadedness, intermittent tachypalpitations but no syncope  No known history of heart disease  Noted in ER to have Hgb of 5.3 Cr 13; also G+stool and started on dialysis today Echo >>LVEF 20-25%; severe pulm HTN with BAE and RV dysfunction ; E/E" >20  Fhx of CAD  Used to work in IT  Past Medical History  Diagnosis Date  . Diabetes mellitus   . HTN (hypertension)   . Osteomyelitis       Surgical History:  Past Surgical History  Procedure Laterality Date  . Left 4th toe amputation    . Cholecystectomy    . Tonsillectomy    . I&d extremity  01/28/2012    Procedure: IRRIGATION AND DEBRIDEMENT EXTREMITY;  Surgeon: Paulene Floor, MD;  Location: [facility];  Service: Orthopedics;  Laterality: Left;  irrigation and drainage left hand  . I&d extremity  01/30/2012    Procedure: IRRIGATION AND DEBRIDEMENT EXTREMITY;  Surgeon: Paulene Floor, MD;  Location: [facility];  Service: Orthopedics;  Laterality: Left;  flexor teno synovectomy and extensor tenosynovectomy arthrosynovectomy wristjoint  . I&d extremity  02/01/2012    Procedure: IRRIGATION AND DEBRIDEMENT EXTREMITY;  Surgeon: Paulene Floor, MD;  Location: [facility];  Service: Orthopedics;  Laterality: Left;  Left Wrist, Hand and Forearm     Home Meds: Prior to Admission medications   Medication Sig Start Date End Date Taking? Authorizing Provider  insulin NPH-insulin regular (NOVOLIN 70/30) (70-30) 100 UNIT/ML injection Inject 15 Units into the skin 2 (two) times daily with a meal.   Yes Historical  Provider, MD  amLODipine (NORVASC) 10 MG tablet Take 1 tablet (10 mg total) by mouth daily. Patient not taking: Reported on 11/22/2014 02/04/12 02/03/13  Sheila Oats, MD  aspirin 81 MG tablet Take 81 mg by mouth daily.    Historical Provider, MD    Inpatient Medications:  . sodium chloride  10 mL/hr Intravenous Once  . sodium chloride   Intravenous Once  . sodium chloride   Intravenous Once  . amLODipine  10 mg Oral Daily  . carvedilol  3.125 mg Oral BID WC  . [START ON 11/25/2014] darbepoetin (ARANESP) injection - DIALYSIS  25 mcg Intravenous Q Mon-HD  . docusate sodium  100 mg Oral BID  . feeding supplement (GLUCERNA SHAKE)  237 mL Oral BID WC  . ferric gluconate (FERRLECIT/NULECIT) IV  125 mg Intravenous Daily  . insulin aspart  0-5 Units Subcutaneous QHS  . insulin aspart  0-9 Units Subcutaneous TID WC  . lisinopril  5 mg Oral Daily  . multivitamin  1 tablet Oral QHS  . pantoprazole  40 mg Oral Q1200  . sodium chloride  3 mL Intravenous Q12H  . sodium chloride  3 mL Intravenous Q12H    Allergies:  Allergies  Allergen Reactions  . Penicillins Hives and Swelling    History   Social History  . Marital Status: Married    Spouse Name: [name]    Number of Children: 3  . Years of Education: 55  Occupational History  . Maintenance Technician    Social History Main Topics  . Smoking status: Former Smoker -- 0.50 packs/day for 4 years    Types: Cigarettes  . Smokeless tobacco: Never Used  . Alcohol Use: No  . Drug Use: No  . Sexual Activity: Not on file   Other Topics Concern  . Not on file   Social History Narrative     Family History  Problem Relation Age of Onset  . Heart disease Mother   . Diabetes Father   . Diabetes Brother   . Diabetes Sister   . Diabetes Brother   . Diabetes Brother      ROS:  Please see the history of present illness.     All other systems reviewed and negative.    Physical Exam:   Blood pressure 171/85, pulse 76, temperature  98.1 F (36.7 C), temperature source Oral, resp. rate 18, height 6\' 1"  (1.854 m), weight 192 lb 10.9 oz (87.4 kg), SpO2 99 %. General: Well developed, well nourished male in no acute distress. Head: Normocephalic, atraumatic, sclera non-icteric, no xanthomas, nares are without discharge. EENT: normal Lymph Nodes:  none Back: without scoliosis/kyphosis, no CVA tendersness Neck: Negative for carotid bruits. JVD 8-10 +HJR Lungs: Clear bilaterally to auscultation without wheezes, rales, or rhonchi. Breathing is unlabored. Heart: RRR with S1 S2. 2/6 systolic murmur , rubs, or gallops appreciated. Abdomen: Soft, , non-distended with normoactive bowel sounds. RUQ tenderness and fullness. No rebound/guarding. No obvious abdominal masse Msk:  Strength and tone appear normal for age. Extremities: No clubbing or cyanosis. No edema.  Distal pedal pulses are 2+ and equal bilaterally. Skin: Warm and Dry Neuro: Alert and oriented X 3. CN III-XII intact Grossly normal sensory and motor function . Psych:  Responds to questions appropriately with a normal affect.      Labs: Cardiac Enzymes  Recent Labs  11/22/14 1704 11/22/14 2205 11/23/14 1355  TROPONINI <0.30 <0.30 <0.30   CBC Lab Results  Component Value Date   WBC 8.4 11/24/2014   HGB 7.5* 11/24/2014   HCT 22.7* 11/24/2014   MCV 79.1 11/24/2014   PLT 286 11/24/2014   PROTIME: No results for input(s): LABPROT, INR in the last 72 hours. Chemistry  Recent Labs Lab 11/24/14 0407  NA 136*  K 4.5  CL 101  CO2 18*  BUN 67*  CREATININE 9.76*  CALCIUM 7.6*  GLUCOSE 97   Lipids Lab Results  Component Value Date   CHOL 75 11/24/2014   HDL 22* 11/24/2014   LDLCALC 43 11/24/2014   TRIG 49 11/24/2014   BNP PRO B NATRIURETIC PEPTIDE (BNP)  Date/Time Value Ref Range Status  11/22/2014 05:09 PM 24651.0* 0 - 125 pg/mL Final   Miscellaneous No results found for: DDIMER  Radiology/Studies:  Dg Chest 2 View (if Patient Has Fever  And/or Copd)  11/22/2014   CLINICAL DATA:  Sternal chest pain and dizziness for 2 days, shortness of breath for a month, sore throat since last week, chills since this summer, personal history of hypertension, diabetes, former smoker  EXAM: CHEST  2 VIEW  COMPARISON:  01/28/2012  FINDINGS: Enlargement of cardiac silhouette with borderline vascular congestion.  Mediastinal contours normal.  Lungs clear.  No infiltrate, pleural effusion or pneumothorax.  Mild biconvex thoracolumbar scoliosis without acute osseous findings.  IMPRESSION: Enlargement of cardiac silhouette.  No acute abnormalities.   Electronically Signed   By: Lavonia Dana M.D.   On: 11/22/2014 16:09   Ct Head  Wo Contrast  11/23/2014   CLINICAL DATA:  63 year old male with intermittent visual changes  EXAM: CT HEAD WITHOUT CONTRAST  TECHNIQUE: Contiguous axial images were obtained from the base of the skull through the vertex without intravenous contrast.  COMPARISON:  None  FINDINGS: Negative for acute intracranial hemorrhage, acute infarction, mass, mass effect, hydrocephalus or midline shift. Gray-white differentiation is preserved throughout. Very mild atrophy. Mild periventricular and subcortical white matter hypoattenuation most consistent with the sequelae of longstanding microvascular ischemic white matter disease. No focal soft tissue or calvarial abnormality. No focal global abnormality. The orbits are symmetric. Normal aeration of the mastoid air cells and visualized paranasal sinuses. Trace atherosclerotic calcification in the bilateral cavernous carotid arteries.  IMPRESSION: 1. No acute intracranial abnormality. 2. 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 3. Intracranial atherosclerosis.   Electronically Signed   By: Jacqulynn Cadet M.D.   On: 11/23/2014 03:08   US Renal  11/23/2014   CLINICAL DATA:  Renal failure  EXAM: RENAL/URINARY TRACT ULTRASOUND COMPLETE  COMPARISON:  None.  FINDINGS: Right  Kidney:  Length: 10.0 cm.  5 mm interpolar calculus.  No hydronephrosis.  Left Kidney:  Length: 9.7 cm.  No mass or hydronephrosis.  Bladder:  Within normal limits.  IMPRESSION: 5 mm interpolar right renal calculus.  No hydronephrosis.   Electronically Signed   By: Julian Hy M.D.   On: 11/23/2014 12:40   Ir Fluoro Guide Cv Line Right  11/23/2014   CLINICAL DATA:  Progressive renal insufficiency, needs long-term access for hemodialysis  EXAM: TUNNELED HEMODIALYSIS CATHETER PLACEMENT WITH ULTRASOUND AND FLUOROSCOPIC GUIDANCE  TECHNIQUE: The procedure, risks, benefits, and alternatives were explained to the patient. Questions regarding the procedure were encouraged and answered. The patient understands and consents to the procedure. As antibiotic prophylaxis, vancomycin 1 g was ordered pre-procedure and administered intravenously within one hour of incision.Patency of the right IJ vein was confirmed with ultrasound with image documentation. An appropriate skin site was determined. Region was prepped using maximum barrier technique including cap and mask, sterile gown, sterile gloves, large sterile sheet, and Chlorhexidine as cutaneous antisepsis. The region was infiltrated locally with 1% lidocaine.  Intravenous Fentanyl and Versed were administered as conscious sedation during continuous cardiorespiratory monitoring by the radiology RN, with a total moderate sedation time of 11 minutes.  Under real-time ultrasound guidance, the right IJ vein was accessed with a 21 gauge micropuncture needle; the needle tip within the vein was confirmed with ultrasound image documentation. Needle exchanged over the 018 guidewire for transitional dilator, which allowed advancement of a Benson wire into the IVC. Over this, an MPA catheter was advanced. A Hemosplit 19 hemodialysis catheter was tunneled from the right anterior chest wall approach to the right IJ dermatotomy site. The MPA catheter was exchanged over an Amplatz  wire for serial vascular dilators which allow placement of a peel-away sheath, through which the catheter was advanced under intermittent fluoroscopy, positioned with its tips in the proximal and midright atrium. Spot chest radiograph confirms good catheter position. No pneumothorax. Catheter was flushed and primed per protocol. Catheter secured externally with O Prolene sutures. The right IJ dermatotomy site was closed with Dermabond.  COMPLICATIONS: COMPLICATIONS None immediate  FLUOROSCOPY TIME:  12 seconds  COMPARISON:  None  IMPRESSION: 1. Technically successful placement of tunneled right IJ hemodialysis catheter with ultrasound and fluoroscopic guidance. Ready for routine use.  ACCESS: Remains approachable for percutaneous intervention as needed.   Electronically Signed   By: Quillian Quince  Vernard Gambles M.D.   On: 11/23/2014 14:25   Ir US Guide Vasc Access Right  11/23/2014   CLINICAL DATA:  Progressive renal insufficiency, needs long-term access for hemodialysis  EXAM: TUNNELED HEMODIALYSIS CATHETER PLACEMENT WITH ULTRASOUND AND FLUOROSCOPIC GUIDANCE  TECHNIQUE: The procedure, risks, benefits, and alternatives were explained to the patient. Questions regarding the procedure were encouraged and answered. The patient understands and consents to the procedure. As antibiotic prophylaxis, vancomycin 1 g was ordered pre-procedure and administered intravenously within one hour of incision.Patency of the right IJ vein was confirmed with ultrasound with image documentation. An appropriate skin site was determined. Region was prepped using maximum barrier technique including cap and mask, sterile gown, sterile gloves, large sterile sheet, and Chlorhexidine as cutaneous antisepsis. The region was infiltrated locally with 1% lidocaine.  Intravenous Fentanyl and Versed were administered as conscious sedation during continuous cardiorespiratory monitoring by the radiology RN, with a total moderate sedation time of 11 minutes.   Under real-time ultrasound guidance, the right IJ vein was accessed with a 21 gauge micropuncture needle; the needle tip within the vein was confirmed with ultrasound image documentation. Needle exchanged over the 018 guidewire for transitional dilator, which allowed advancement of a Benson wire into the IVC. Over this, an MPA catheter was advanced. A Hemosplit 19 hemodialysis catheter was tunneled from the right anterior chest wall approach to the right IJ dermatotomy site. The MPA catheter was exchanged over an Amplatz wire for serial vascular dilators which allow placement of a peel-away sheath, through which the catheter was advanced under intermittent fluoroscopy, positioned with its tips in the proximal and midright atrium. Spot chest radiograph confirms good catheter position. No pneumothorax. Catheter was flushed and primed per protocol. Catheter secured externally with O Prolene sutures. The right IJ dermatotomy site was closed with Dermabond.  COMPLICATIONS: COMPLICATIONS None immediate  FLUOROSCOPY TIME:  12 seconds  COMPARISON:  None  IMPRESSION: 1. Technically successful placement of tunneled right IJ hemodialysis catheter with ultrasound and fluoroscopic guidance. Ready for routine use.  ACCESS: Remains approachable for percutaneous intervention as needed.   Electronically Signed   By: Arne Cleveland M.D.   On: 11/23/2014 14:25    EKG: NSR 16/14/42 IVCD  Monophasic R wave V6   Assessment and Plan:  Cardiomyopathy  Pulmonary Hypertension  ESRD  New  Anemia  With G+ stool  HTN  DM Palpitations  Pt presents withnew ESRD and need for dialysis  He is found to have G+ stool, but without hx of melena and to be profoundly anemic He is also found to have severe LV dysfunction and enlargement with concomitant Pulm HTN in the setting of volume overload and elevated E/E' consistent with elevated LVEDP  From cardiac point of view would use carvedilol and lisinopril for risk reduction and  also to lower blood pressue  Would use hydral/nitrates in preference to amlodipine He will need catheterization R and L and prob prior to discharge, and prob prior to GI bleeding evaluation unless it is felt to be significant which i think w absence of melena is unlikely  Hopefully his pulm HTN is 2/2 elevated LVEDP and will resolve I aslso worry about his palpitations with LV dysfunction.  If he were to have dignificant VT on monitor, would consider LIFEVEST utilization at the time of discharge       Virl Axe

## 2014-11-24 NOTE — Progress Notes (Signed)
Progress Note for [facility].  Subjective: Feeling much better.  Objective: Vital signs in last 24 hours: Temp:  [98.1 F (36.7 C)-100.4 F (38 C)] 100.4 F (38 C) (11/29 0448) Pulse Rate:  [74-98] 79 (11/29 0448) Resp:  [12-19] 18 (11/29 0448) BP: (147-189)/(76-100) 155/83 mmHg (11/29 0448) SpO2:  [95 %-100 %] 99 % (11/29 0448) Weight:  [86 kg (189 lb 9.5 oz)-87.4 kg (192 lb 10.9 oz)] 86 kg (189 lb 9.5 oz) (11/28 1900) Last BM Date: 11/22/14  Intake/Output from previous day: 11/28 0701 - 11/29 0700 In: 1605 [P.O.:600; Blood:1005] Out: [id number] [Urine:375] Intake/Output this shift:    General appearance: alert and no distress GI: soft, non-tender; bowel sounds normal; no masses,  no organomegaly  Lab Results:  Recent Labs  11/22/14 1527 11/23/14 1634 11/23/14 2120 11/24/14 0407  WBC 9.1  --  9.0 8.4  HGB 5.3* 6.2* 7.8* 7.5*  HCT 16.4* 19.2* 23.2* 22.7*  PLT 373  --  290 286   BMET  Recent Labs  11/22/14 2205 11/23/14 1355 11/24/14 0407  NA 134* 137 136*  K 5.3 4.9 4.5  CL 101 103 101  CO2 12* 13* 18*  GLUCOSE 103* 111* 97  BUN 108* 101* 67*  CREATININE 13.72* 13.04* 9.76*  CALCIUM 8.1* 8.0* 7.6*   LFT  Recent Labs  11/24/14 0407  ALBUMIN 2.7*   PT/INR No results for input(s): LABPROT, INR in the last 72 hours. Hepatitis Panel  Recent Labs  11/23/14 1634  HEPBSAG NEGATIVE   C-Diff No results for input(s): CDIFFTOX in the last 72 hours. Fecal Lactopherrin No results for input(s): FECLLACTOFRN in the last 72 hours.  Studies/Results: Dg Chest 2 View (if Patient Has Fever And/or Copd)  11/22/2014   CLINICAL DATA:  Sternal chest pain and dizziness for 2 days, shortness of breath for a month, sore throat since last week, chills since this summer, personal history of hypertension, diabetes, former smoker  EXAM: CHEST  2 VIEW  COMPARISON:  01/28/2012  FINDINGS: Enlargement of cardiac silhouette with borderline vascular congestion.  Mediastinal  contours normal.  Lungs clear.  No infiltrate, pleural effusion or pneumothorax.  Mild biconvex thoracolumbar scoliosis without acute osseous findings.  IMPRESSION: Enlargement of cardiac silhouette.  No acute abnormalities.   Electronically Signed   By: Lavonia Dana M.D.   On: 11/22/2014 16:09   Ct Head Wo Contrast  11/23/2014   CLINICAL DATA:  64 year old male with intermittent visual changes  EXAM: CT HEAD WITHOUT CONTRAST  TECHNIQUE: Contiguous axial images were obtained from the base of the skull through the vertex without intravenous contrast.  COMPARISON:  None  FINDINGS: Negative for acute intracranial hemorrhage, acute infarction, mass, mass effect, hydrocephalus or midline shift. Gray-white differentiation is preserved throughout. Very mild atrophy. Mild periventricular and subcortical white matter hypoattenuation most consistent with the sequelae of longstanding microvascular ischemic white matter disease. No focal soft tissue or calvarial abnormality. No focal global abnormality. The orbits are symmetric. Normal aeration of the mastoid air cells and visualized paranasal sinuses. Trace atherosclerotic calcification in the bilateral cavernous carotid arteries.  IMPRESSION: 1. No acute intracranial abnormality. 2. 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 3. Intracranial atherosclerosis.   Electronically Signed   By: Jacqulynn Cadet M.D.   On: 11/23/2014 03:08   US Renal  11/23/2014   CLINICAL DATA:  Renal failure  EXAM: RENAL/URINARY TRACT ULTRASOUND COMPLETE  COMPARISON:  None.  FINDINGS: Right Kidney:  Length: 10.0 cm.  5 mm interpolar  calculus.  No hydronephrosis.  Left Kidney:  Length: 9.7 cm.  No mass or hydronephrosis.  Bladder:  Within normal limits.  IMPRESSION: 5 mm interpolar right renal calculus.  No hydronephrosis.   Electronically Signed   By: Julian Hy M.D.   On: 11/23/2014 12:40   Ir Fluoro Guide Cv Line Right  11/23/2014   CLINICAL DATA:   Progressive renal insufficiency, needs long-term access for hemodialysis  EXAM: TUNNELED HEMODIALYSIS CATHETER PLACEMENT WITH ULTRASOUND AND FLUOROSCOPIC GUIDANCE  TECHNIQUE: The procedure, risks, benefits, and alternatives were explained to the patient. Questions regarding the procedure were encouraged and answered. The patient understands and consents to the procedure. As antibiotic prophylaxis, vancomycin 1 g was ordered pre-procedure and administered intravenously within one hour of incision.Patency of the right IJ vein was confirmed with ultrasound with image documentation. An appropriate skin site was determined. Region was prepped using maximum barrier technique including cap and mask, sterile gown, sterile gloves, large sterile sheet, and Chlorhexidine as cutaneous antisepsis. The region was infiltrated locally with 1% lidocaine.  Intravenous Fentanyl and Versed were administered as conscious sedation during continuous cardiorespiratory monitoring by the radiology RN, with a total moderate sedation time of 11 minutes.  Under real-time ultrasound guidance, the right IJ vein was accessed with a 21 gauge micropuncture needle; the needle tip within the vein was confirmed with ultrasound image documentation. Needle exchanged over the 018 guidewire for transitional dilator, which allowed advancement of a Benson wire into the IVC. Over this, an MPA catheter was advanced. A Hemosplit 19 hemodialysis catheter was tunneled from the right anterior chest wall approach to the right IJ dermatotomy site. The MPA catheter was exchanged over an Amplatz wire for serial vascular dilators which allow placement of a peel-away sheath, through which the catheter was advanced under intermittent fluoroscopy, positioned with its tips in the proximal and midright atrium. Spot chest radiograph confirms good catheter position. No pneumothorax. Catheter was flushed and primed per protocol. Catheter secured externally with O Prolene  sutures. The right IJ dermatotomy site was closed with Dermabond.  COMPLICATIONS: COMPLICATIONS None immediate  FLUOROSCOPY TIME:  12 seconds  COMPARISON:  None  IMPRESSION: 1. Technically successful placement of tunneled right IJ hemodialysis catheter with ultrasound and fluoroscopic guidance. Ready for routine use.  ACCESS: Remains approachable for percutaneous intervention as needed.   Electronically Signed   By: Arne Cleveland M.D.   On: 11/23/2014 14:25   Ir US Guide Vasc Access Right  11/23/2014   CLINICAL DATA:  Progressive renal insufficiency, needs long-term access for hemodialysis  EXAM: TUNNELED HEMODIALYSIS CATHETER PLACEMENT WITH ULTRASOUND AND FLUOROSCOPIC GUIDANCE  TECHNIQUE: The procedure, risks, benefits, and alternatives were explained to the patient. Questions regarding the procedure were encouraged and answered. The patient understands and consents to the procedure. As antibiotic prophylaxis, vancomycin 1 g was ordered pre-procedure and administered intravenously within one hour of incision.Patency of the right IJ vein was confirmed with ultrasound with image documentation. An appropriate skin site was determined. Region was prepped using maximum barrier technique including cap and mask, sterile gown, sterile gloves, large sterile sheet, and Chlorhexidine as cutaneous antisepsis. The region was infiltrated locally with 1% lidocaine.  Intravenous Fentanyl and Versed were administered as conscious sedation during continuous cardiorespiratory monitoring by the radiology RN, with a total moderate sedation time of 11 minutes.  Under real-time ultrasound guidance, the right IJ vein was accessed with a 21 gauge micropuncture needle; the needle tip within the vein was confirmed with ultrasound image documentation. Needle  exchanged over the 018 guidewire for transitional dilator, which allowed advancement of a Benson wire into the IVC. Over this, an MPA catheter was advanced. A Hemosplit 19  hemodialysis catheter was tunneled from the right anterior chest wall approach to the right IJ dermatotomy site. The MPA catheter was exchanged over an Amplatz wire for serial vascular dilators which allow placement of a peel-away sheath, through which the catheter was advanced under intermittent fluoroscopy, positioned with its tips in the proximal and midright atrium. Spot chest radiograph confirms good catheter position. No pneumothorax. Catheter was flushed and primed per protocol. Catheter secured externally with O Prolene sutures. The right IJ dermatotomy site was closed with Dermabond.  COMPLICATIONS: COMPLICATIONS None immediate  FLUOROSCOPY TIME:  12 seconds  COMPARISON:  None  IMPRESSION: 1. Technically successful placement of tunneled right IJ hemodialysis catheter with ultrasound and fluoroscopic guidance. Ready for routine use.  ACCESS: Remains approachable for percutaneous intervention as needed.   Electronically Signed   By: Arne Cleveland M.D.   On: 11/23/2014 14:25    Medications:  Scheduled: . sodium chloride  10 mL/hr Intravenous Once  . sodium chloride   Intravenous Once  . sodium chloride   Intravenous Once  . amLODipine  10 mg Oral Daily  . docusate sodium  100 mg Oral BID  . feeding supplement (GLUCERNA SHAKE)  237 mL Oral BID WC  . ferric gluconate (FERRLECIT/NULECIT) IV  125 mg Intravenous Daily  . insulin aspart  0-5 Units Subcutaneous QHS  . insulin aspart  0-9 Units Subcutaneous TID WC  . pantoprazole  40 mg Oral Q1200  . sodium chloride  3 mL Intravenous Q12H  . sodium chloride  3 mL Intravenous Q12H   Continuous:   Assessment/Plan: 1) Renal failure from HTN and DM. 2) Microcytic anemia.   His mentation is much better this AM and he feels better after hemodialysis.  He is supposed to have another treatment today.  Plan: 1) EGD/Colonoscopy in the near future. 2) Continue with dialysis. 3) [facility] to assume care in the AM.   LOS: 2 days   Brylon Brenning  D 11/24/2014, 8:44 AM

## 2014-11-24 NOTE — Plan of Care (Signed)
Problem: Phase I Progression Outcomes Goal: Education officer, museum consult (if SNF or HD pt.) Outcome: Completed/Met Date Met:  11/24/14

## 2014-11-24 NOTE — Plan of Care (Signed)
Problem: Phase I Progression Outcomes Goal: Uremic symptoms managed Outcome: Completed/Met Date Met:  11/24/14

## 2014-11-24 NOTE — Plan of Care (Signed)
Problem: Consults Goal: Nutrition Consult-if indicated Outcome: Completed/Met Date Met:  11/24/14 Dietitian consult requested for pt/family education for renal diet.     

## 2014-11-24 NOTE — Progress Notes (Signed)
[facility] Progress Note  Assessment/Plan: 1. 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 HIV neg, [place] no hydro,kidney sizes 1.7-2 cm smaller than in 2009. Clinical presentation c/w ESRD with profound anemia, metabolic acidosis, hyperkalemia, and classic symptom complex of some duration (at least 6 months). S/p [facility] 11/28 by IR.  Vein mapping ordered. VVS consult Monday AM. Financial counselor will need to see (uninsured).  Needs options education - will enlist nursing help. For second HD today and again on Monday. CLIP. 2. Anemia - Hgb up to 7.5 this am CKD + Fe deficiency.  s/p 3 PRBC Replete iron. Start Aranesp 25. Hemoccult stools 3. CKD-MBD -  PTH pending and phosphorus 4.8 Ca 7.6 4. HTN - Meds/volume control - net UF 1.4 L Saturday; goal 1 L today and Monday - gentle lowering of voluem 5. DM - has not used any insulin or any hypoglycemic meds for some time. A1C 5.7. Reduced renal insulin clearance probably accounts for the improvement in BS's he noted in the past 6 months and anorexia has likely limited intake 6. Dyspnea - likely related to his severe anemia and volume issues 7. 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 Nutrition - alb 2.7 anorexia improved - needs education - gave wife NK[facility] website information and asked nutrition to see again - saw earlier  [name], PA-C [place] [phone] 11/24/2014,8:43 AM  LOS: 2 days   I have seen and examined this patient and agree with plan and assessment in the note of MBergman PA. Pt with new ESRD. Ultrasound shows no hydro, with a 1.7-2 cm reduction in kidney sizes since 2009.   Getting 2nd HD today, and will get 3rd tomorrow. Says already feeling better re nausea and appetite.  S/p 3 unit transfusion for severe anemia. Now getting Aranesp and iron load. PTH is pending.  Start CLIP process on Monday (will be closest to [facility]). Will need  permanent access and emergency Medicaid before can be discharged to outpt HD  [name], MD [place] Pager 11/24/2014, 10:53 AM    Subjective:   Pt and wife aware pt may be ESRD. Have not had any formal education re diet/options. Pt tolerated first HD yesterday, had addition 2 units blood and feels much better. Appetite back.   Objective Filed Vitals:   11/23/14 1900 11/23/14 2125 11/24/14 0013 11/24/14 0448  BP: 165/96 164/77 151/77 155/83  Pulse: 79 81 83 79  Temp:  99.2 F (37.3 C) 99.2 F (37.3 C) 100.4 F (38 C)  TempSrc:  Oral Oral Oral  Resp: 19 18 17 18   Height:      Weight: 86 kg (189 lb 9.5 oz)     SpO2: 100% 95% 98% 99%   Physical Exam General: NAD eating breakfast Heart: RRR no rub Lungs: grossly clear Abdomen: soft NT Extremities: tr LE edema Dialysis Access: right IJ TDC (placed by IR 11/28)  Additional Objective Labs: Basic Metabolic Panel:  Recent Labs Lab 11/22/14 2205 11/23/14 1355 11/24/14 0407  NA 134* 137 136*  K 5.3 4.9 4.5  CL 101 103 101  CO2 12* 13* 18*  GLUCOSE 103* 111* 97  BUN 108* 101* 67*  CREATININE 13.72* 13.04* 9.76*  CALCIUM 8.1* 8.0* 7.6*  PHOS  --  7.6* 4.8*     Recent Labs Lab 11/23/14 1355 11/24/14 0407  ALBUMIN 3.0* 2.7*     Recent  Labs Lab 11/22/14 1527 11/23/14 1634 11/23/14 2120 11/24/14 0407  WBC 9.1  --  9.0 8.4  HGB 5.3* 6.2* 7.8* 7.5*  HCT 16.4* 19.2* 23.2* 22.7*  MCV 79.6  --  78.9 79.1  PLT 373  --  290 286   Blood Culture    Component Value Date/Time   SDES URINE, RANDOM 11/22/2014 1740   SPECREQUEST NONE 11/22/2014 1740   CULT NO GROWTH Performed at [facility]  11/22/2014 1740   REPTSTATUS 11/23/2014 FINAL 11/22/2014 1740      Recent Labs Lab 11/22/14 1704 11/22/14 2205 11/23/14 1355  TROPONINI <0.30 <0.30 <0.30   CBG:  Recent Labs Lab 11/22/14 2147 11/23/14 0052 11/23/14 1321 11/23/14 2122 11/24/14 0745  GLUCAP 94 115* 90  157* 86   Iron Studies:  Recent Labs  11/22/14 1704  IRON 16*  TIBC 253  FERRITIN 45    Studies/Results: Dg Chest 2 View (if Patient Has Fever And/or Copd)  11/22/2014   CLINICAL DATA:  Sternal chest pain and dizziness for 2 days, shortness of breath for a month, sore throat since last week, chills since this summer, personal history of hypertension, diabetes, former smoker  EXAM: CHEST  2 VIEW  COMPARISON:  01/28/2012  FINDINGS: Enlargement of cardiac silhouette with borderline vascular congestion.  Mediastinal contours normal.  Lungs clear.  No infiltrate, pleural effusion or pneumothorax.  Mild biconvex thoracolumbar scoliosis without acute osseous findings.  IMPRESSION: Enlargement of cardiac silhouette.  No acute abnormalities.   Electronically Signed   By: Lavonia Dana M.D.   On: 11/22/2014 16:09   Ct Head Wo Contrast  11/23/2014   CLINICAL DATA:  63 year old male with intermittent visual changes  EXAM: CT HEAD WITHOUT CONTRAST  TECHNIQUE: Contiguous axial images were obtained from the base of the skull through the vertex without intravenous contrast.  COMPARISON:  None  FINDINGS: Negative for acute intracranial hemorrhage, acute infarction, mass, mass effect, hydrocephalus or midline shift. Gray-white differentiation is preserved throughout. Very mild atrophy. Mild periventricular and subcortical white matter hypoattenuation most consistent with the sequelae of longstanding microvascular ischemic white matter disease. No focal soft tissue or calvarial abnormality. No focal global abnormality. The orbits are symmetric. Normal aeration of the mastoid air cells and visualized paranasal sinuses. Trace atherosclerotic calcification in the bilateral cavernous carotid arteries.  IMPRESSION: 1. No acute intracranial abnormality. 2. 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 3. Intracranial atherosclerosis.   Electronically Signed   By: Jacqulynn Cadet M.D.   On:  11/23/2014 03:08   US Renal  11/23/2014   CLINICAL DATA:  Renal failure  EXAM: RENAL/URINARY TRACT ULTRASOUND COMPLETE  COMPARISON:  None.  FINDINGS: Right Kidney:  Length: 10.0 cm.  5 mm interpolar calculus.  No hydronephrosis.  Left Kidney:  Length: 9.7 cm.  No mass or hydronephrosis.  Bladder:  Within normal limits.  IMPRESSION: 5 mm interpolar right renal calculus.  No hydronephrosis.   Electronically Signed   By: Julian Hy M.D.   On: 11/23/2014 12:40   Ir Fluoro Guide Cv Line Right  11/23/2014   CLINICAL DATA:  Progressive renal insufficiency, needs long-term access for hemodialysis  EXAM: TUNNELED HEMODIALYSIS CATHETER PLACEMENT WITH ULTRASOUND AND FLUOROSCOPIC GUIDANCE  TECHNIQUE: The procedure, risks, benefits, and alternatives were explained to the patient. Questions regarding the procedure were encouraged and answered. The patient understands and consents to the procedure. As antibiotic prophylaxis, vancomycin 1 g was ordered pre-procedure and administered intravenously within one hour of incision.Patency of  the right IJ vein was confirmed with ultrasound with image documentation. An appropriate skin site was determined. Region was prepped using maximum barrier technique including cap and mask, sterile gown, sterile gloves, large sterile sheet, and Chlorhexidine as cutaneous antisepsis. The region was infiltrated locally with 1% lidocaine.  Intravenous Fentanyl and Versed were administered as conscious sedation during continuous cardiorespiratory monitoring by the radiology RN, with a total moderate sedation time of 11 minutes.  Under real-time ultrasound guidance, the right IJ vein was accessed with a 21 gauge micropuncture needle; the needle tip within the vein was confirmed with ultrasound image documentation. Needle exchanged over the 018 guidewire for transitional dilator, which allowed advancement of a Benson wire into the IVC. Over this, an MPA catheter was advanced. A Hemosplit 19  hemodialysis catheter was tunneled from the right anterior chest wall approach to the right IJ dermatotomy site. The MPA catheter was exchanged over an Amplatz wire for serial vascular dilators which allow placement of a peel-away sheath, through which the catheter was advanced under intermittent fluoroscopy, positioned with its tips in the proximal and midright atrium. Spot chest radiograph confirms good catheter position. No pneumothorax. Catheter was flushed and primed per protocol. Catheter secured externally with O Prolene sutures. The right IJ dermatotomy site was closed with Dermabond.  COMPLICATIONS: COMPLICATIONS None immediate  FLUOROSCOPY TIME:  12 seconds  COMPARISON:  None  IMPRESSION: 1. Technically successful placement of tunneled right IJ hemodialysis catheter with ultrasound and fluoroscopic guidance. Ready for routine use.  ACCESS: Remains approachable for percutaneous intervention as needed.   Electronically Signed   By: Arne Cleveland M.D.   On: 11/23/2014 14:25   Ir US Guide Vasc Access Right  11/23/2014   CLINICAL DATA:  Progressive renal insufficiency, needs long-term access for hemodialysis  EXAM: TUNNELED HEMODIALYSIS CATHETER PLACEMENT WITH ULTRASOUND AND FLUOROSCOPIC GUIDANCE  TECHNIQUE: The procedure, risks, benefits, and alternatives were explained to the patient. Questions regarding the procedure were encouraged and answered. The patient understands and consents to the procedure. As antibiotic prophylaxis, vancomycin 1 g was ordered pre-procedure and administered intravenously within one hour of incision.Patency of the right IJ vein was confirmed with ultrasound with image documentation. An appropriate skin site was determined. Region was prepped using maximum barrier technique including cap and mask, sterile gown, sterile gloves, large sterile sheet, and Chlorhexidine as cutaneous antisepsis. The region was infiltrated locally with 1% lidocaine.  Intravenous Fentanyl and Versed  were administered as conscious sedation during continuous cardiorespiratory monitoring by the radiology RN, with a total moderate sedation time of 11 minutes.  Under real-time ultrasound guidance, the right IJ vein was accessed with a 21 gauge micropuncture needle; the needle tip within the vein was confirmed with ultrasound image documentation. Needle exchanged over the 018 guidewire for transitional dilator, which allowed advancement of a Benson wire into the IVC. Over this, an MPA catheter was advanced. A Hemosplit 19 hemodialysis catheter was tunneled from the right anterior chest wall approach to the right IJ dermatotomy site. The MPA catheter was exchanged over an Amplatz wire for serial vascular dilators which allow placement of a peel-away sheath, through which the catheter was advanced under intermittent fluoroscopy, positioned with its tips in the proximal and midright atrium. Spot chest radiograph confirms good catheter position. No pneumothorax. Catheter was flushed and primed per protocol. Catheter secured externally with O Prolene sutures. The right IJ dermatotomy site was closed with Dermabond.  COMPLICATIONS: COMPLICATIONS None immediate  FLUOROSCOPY TIME:  12 seconds  COMPARISON:  None  IMPRESSION: 1. Technically successful placement of tunneled right IJ hemodialysis catheter with ultrasound and fluoroscopic guidance. Ready for routine use.  ACCESS: Remains approachable for percutaneous intervention as needed.   Electronically Signed   By: Arne Cleveland M.D.   On: 11/23/2014 14:25   Medications:   . sodium chloride  10 mL/hr Intravenous Once  . sodium chloride   Intravenous Once  . sodium chloride   Intravenous Once  . amLODipine  10 mg Oral Daily  . [START ON 11/25/2014] darbepoetin (ARANESP) injection - DIALYSIS  25 mcg Intravenous Q Mon-HD  . docusate sodium  100 mg Oral BID  . feeding supplement (GLUCERNA SHAKE)  237 mL Oral BID WC  . ferric gluconate (FERRLECIT/NULECIT) IV  125 mg  Intravenous Daily  . insulin aspart  0-5 Units Subcutaneous QHS  . insulin aspart  0-9 Units Subcutaneous TID WC  . multivitamin  1 tablet Oral QHS  . pantoprazole  40 mg Oral Q1200  . sodium chloride  3 mL Intravenous Q12H  . sodium chloride  3 mL Intravenous Q12H

## 2014-11-25 DIAGNOSIS — R195 Other fecal abnormalities: Secondary | ICD-10-CM

## 2014-11-25 DIAGNOSIS — N186 End stage renal disease: Secondary | ICD-10-CM

## 2014-11-25 DIAGNOSIS — N179 Acute kidney failure, unspecified: Secondary | ICD-10-CM

## 2014-11-25 DIAGNOSIS — I5021 Acute systolic (congestive) heart failure: Secondary | ICD-10-CM

## 2014-11-25 DIAGNOSIS — Z992 Dependence on renal dialysis: Secondary | ICD-10-CM

## 2014-11-25 LAB — RENAL FUNCTION PANEL
Albumin: 2.7 g/dL — ABNORMAL LOW (ref 3.5–5.2)
Anion gap: 16 — ABNORMAL HIGH (ref 5–15)
BUN: 52 mg/dL — ABNORMAL HIGH (ref 6–23)
CO2: 21 mEq/L (ref 19–32)
Calcium: 7.5 mg/dL — ABNORMAL LOW (ref 8.4–10.5)
Chloride: 98 mEq/L (ref 96–112)
Creatinine, Ser: 8.08 mg/dL — ABNORMAL HIGH (ref 0.50–1.35)
GFR calc Af Amer: 7 mL/min — ABNORMAL LOW (ref >90)
GFR calc non Af Amer: 6 mL/min — ABNORMAL LOW (ref >90)
Glucose, Bld: 97 mg/dL (ref 70–99)
Phosphorus: 4.8 mg/dL — ABNORMAL HIGH (ref 2.3–4.6)
Potassium: 4.2 mEq/L (ref 3.7–5.3)
Sodium: 135 mEq/L — ABNORMAL LOW (ref 137–147)

## 2014-11-25 LAB — CBC
HCT: 23.8 % — ABNORMAL LOW (ref 39.0–52.0)
Hemoglobin: 7.8 g/dL — ABNORMAL LOW (ref 13.0–17.0)
MCH: 27 pg (ref 26.0–34.0)
MCHC: 32.8 g/dL (ref 30.0–36.0)
MCV: 82.4 fL (ref 78.0–100.0)
Platelets: 232 10*3/uL (ref 150–400)
RBC: 2.89 MIL/uL — ABNORMAL LOW (ref 4.22–5.81)
RDW: 16.3 % — ABNORMAL HIGH (ref 11.5–15.5)
WBC: 8.8 10*3/uL (ref 4.0–10.5)

## 2014-11-25 LAB — GLUCOSE, CAPILLARY
GLUCOSE-CAPILLARY: 110 mg/dL — AB (ref 70–99)
GLUCOSE-CAPILLARY: 135 mg/dL — AB (ref 70–99)
Glucose-Capillary: 86 mg/dL (ref 70–99)

## 2014-11-25 MED ORDER — PEG-KCL-NACL-NASULF-NA ASC-C 100 G PO SOLR
0.5000 | Freq: Once | ORAL | Status: DC
  Filled 2014-11-25: qty 1

## 2014-11-25 MED ORDER — BISACODYL 5 MG PO TBEC: 5.0000 mg | DELAYED_RELEASE_TABLET | Freq: Once | ORAL | Status: DC

## 2014-11-25 MED ORDER — PEG-KCL-NACL-NASULF-NA ASC-C 100 G PO SOLR: 1.0000 | Freq: Once | ORAL | Status: DC

## 2014-11-25 MED ORDER — NEPRO/CARBSTEADY PO LIQD
237.0000 mL | Freq: Two times a day (BID) | ORAL | Status: DC
  Filled 2014-11-25: qty 237

## 2014-11-25 MED ORDER — ASPIRIN 81 MG PO CHEW
81.0000 mg | CHEWABLE_TABLET | ORAL | Status: AC
  Administered 2014-11-26: 81 mg via ORAL
  Filled 2014-11-25: qty 1

## 2014-11-25 MED ORDER — SODIUM CHLORIDE 0.9 % IJ SOLN: 3.0000 mL | Freq: Two times a day (BID) | INTRAMUSCULAR | Status: DC

## 2014-11-25 MED ORDER — SODIUM CHLORIDE 0.9 % IV SOLN: 250.0000 mL | INTRAVENOUS | Status: DC | PRN

## 2014-11-25 MED ORDER — SODIUM CHLORIDE 0.9 % IJ SOLN: 3.0000 mL | INTRAMUSCULAR | Status: DC | PRN

## 2014-11-25 MED ORDER — VANCOMYCIN HCL IN DEXTROSE 1-5 GM/200ML-% IV SOLN
1000.0000 mg | INTRAVENOUS | Status: AC
  Administered 2014-11-27: 1000 mg via INTRAVENOUS
  Filled 2014-11-25: qty 200

## 2014-11-25 MED ORDER — PEG-KCL-NACL-NASULF-NA ASC-C 100 G PO SOLR: 0.5000 | Freq: Once | ORAL | Status: DC

## 2014-11-25 NOTE — Progress Notes (Signed)
SUBJECTIVE: The patient is much improved today.  No chest pain or shortness of breath, appetite improved.   He states that he is having epigastric pain after eating. No nausea, vomiting, diarrhea, BRBPR  CURRENT MEDICATIONS: . sodium chloride  10 mL/hr Intravenous Once  . sodium chloride   Intravenous Once  . sodium chloride   Intravenous Once  . bisacodyl  5 mg Oral Once  . carvedilol  6.25 mg Oral BID WC  . darbepoetin (ARANESP) injection - DIALYSIS  25 mcg Intravenous Q Mon-HD  . docusate sodium  100 mg Oral BID  . feeding supplement (GLUCERNA SHAKE)  237 mL Oral BID WC  . ferric gluconate (FERRLECIT/NULECIT) IV  125 mg Intravenous Daily  . hydrALAZINE  25 mg Oral 3 times per day  . insulin aspart  0-5 Units Subcutaneous QHS  . insulin aspart  0-9 Units Subcutaneous TID WC  . isosorbide mononitrate  30 mg Oral Daily  . lisinopril  5 mg Oral Daily  . multivitamin  1 tablet Oral QHS  . pantoprazole  40 mg Oral Q1200  . sodium chloride  3 mL Intravenous Q12H  . sodium chloride  3 mL Intravenous Q12H      OBJECTIVE: Physical Exam: Filed Vitals:   11/24/14 1733 11/24/14 2056 11/25/14 0445 11/25/14 0900  BP: 167/86 141/68 134/71 129/59  Pulse: 87 76 71 74  Temp: 98.1 F (36.7 C) 99.8 F (37.7 C) 98.7 F (37.1 C) 98.9 F (37.2 C)  TempSrc: Oral  Oral Oral  Resp: 18 17 18 20   Height:      Weight:  187 lb (84.823 kg)    SpO2: 96% 97% 99% 96%    Intake/Output Summary (Last 24 hours) at 11/25/14 1247 Last data filed at 11/25/14 0900  Gross per 24 hour  Intake    683 ml  Output   1400 ml  Net   -717 ml    Telemetry reveals sinus rhythm, occasional PVC's, short run SVT  GEN- The patient is well appearing, alert and oriented x 3 today.   Head- normocephalic, atraumatic Eyes-  Sclera clear, conjunctiva pink Ears- hearing intact Oropharynx- clear Neck- supple  Lymph- no cervical lymphadenopathy Lungs- Clear to ausculation bilaterally, normal work of  breathing Heart- Regular rate and rhythm, 2/6 SEM GI- soft, NT, ND, + BS Extremities- no clubbing, cyanosis, or edema, 2+ DP/PT pulses Skin- no rash or lesion Psych- euthymic mood, full affect Neuro- strength and sensation are intact  LABS: Basic Metabolic Panel:  Recent Labs  11/23/14 1355 11/24/14 0407  NA 137 136*  K 4.9 4.5  CL 103 101  CO2 13* 18*  GLUCOSE 111* 97  BUN 101* 67*  CREATININE 13.04* 9.76*  CALCIUM 8.0* 7.6*  PHOS 7.6* 4.8*   Liver Function Tests:  Recent Labs  11/23/14 1355 11/24/14 0407  ALBUMIN 3.0* 2.7*   CBC:  Recent Labs  11/23/14 2120 11/24/14 0407  WBC 9.0 8.4  HGB 7.8* 7.5*  HCT 23.2* 22.7*  MCV 78.9 79.1  PLT 290 286   Cardiac Enzymes:  Recent Labs  11/22/14 1704 11/22/14 2205 11/23/14 1355  TROPONINI <0.30 <0.30 <0.30   Hemoglobin A1C:  Recent Labs  11/23/14 1355  HGBA1C 5.7*   Fasting Lipid Panel:  Recent Labs  11/24/14 0407  CHOL 75  HDL 22*  LDLCALC 43  TRIG 49  CHOLHDL 3.4   Anemia Panel:  Recent Labs  11/22/14 1704  VITAMINB12 713  FOLATE 12.8  FERRITIN 45  TIBC 253  IRON 16*  RETICCTPCT 2.5    RADIOLOGY: Dg Chest 2 View (if Patient Has Fever And/or Copd) 11/22/2014   CLINICAL DATA:  Sternal chest pain and dizziness for 2 days, shortness of breath for a month, sore throat since last week, chills since this summer, personal history of hypertension, diabetes, former smoker  EXAM: CHEST  2 VIEW  COMPARISON:  01/28/2012  FINDINGS: Enlargement of cardiac silhouette with borderline vascular congestion.  Mediastinal contours normal.  Lungs clear.  No infiltrate, pleural effusion or pneumothorax.  Mild biconvex thoracolumbar scoliosis without acute osseous findings.  IMPRESSION: Enlargement of cardiac silhouette.  No acute abnormalities.   Electronically Signed   By: Lavonia Dana M.D.   On: 11/22/2014 16:09   Ct Head Wo Contrast 11/23/2014   CLINICAL DATA:  63 year old male with intermittent visual  changes  EXAM: CT HEAD WITHOUT CONTRAST  TECHNIQUE: Contiguous axial images were obtained from the base of the skull through the vertex without intravenous contrast.  COMPARISON:  None  FINDINGS: Negative for acute intracranial hemorrhage, acute infarction, mass, mass effect, hydrocephalus or midline shift. Gray-white differentiation is preserved throughout. Very mild atrophy. Mild periventricular and subcortical white matter hypoattenuation most consistent with the sequelae of longstanding microvascular ischemic white matter disease. No focal soft tissue or calvarial abnormality. No focal global abnormality. The orbits are symmetric. Normal aeration of the mastoid air cells and visualized paranasal sinuses. Trace atherosclerotic calcification in the bilateral cavernous carotid arteries.  IMPRESSION: 1. No acute intracranial abnormality. 2. 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 3. Intracranial atherosclerosis.   Electronically Signed   By: Jacqulynn Cadet M.D.   On: 11/23/2014 03:08   US Renal 11/23/2014   CLINICAL DATA:  Renal failure  EXAM: RENAL/URINARY TRACT ULTRASOUND COMPLETE  COMPARISON:  None.  FINDINGS: Right Kidney:  Length: 10.0 cm.  5 mm interpolar calculus.  No hydronephrosis.  Left Kidney:  Length: 9.7 cm.  No mass or hydronephrosis.  Bladder:  Within normal limits.  IMPRESSION: 5 mm interpolar right renal calculus.  No hydronephrosis.   Electronically Signed   By: Julian Hy M.D.   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  Cardiomyopathy Newly diagnosed this admission Continue Coreg,  lisinopril, hydralazine, imdur Will uptitrate Coreg today with non-sustained atrial tach and PVC's Will need right and left heart cath prior to discharge -   2.  SVT Likely cause of palpitations prior to admission Will uptitrate Coreg today  3.  HTN Improved today As above  4.  Anemia/DM/renal failure Per primary team, nephrology, GI  Have spoken with GI and Renal   As not actively bleeding, will anticipate cath as next step to exclude high risk CAD potentially underlying newly identified CAD in context of longstanding risk factors Pulm Pres were exceedingly high, but so was E/E' so suspect secondary  Reviewed the above with patient and family

## 2014-11-25 NOTE — Plan of Care (Signed)
Problem: Phase III Progression Outcomes Goal: Tolerating diet Outcome: Completed/Met Date Met:  11/25/14  Problem: Consults Goal: Skin Care Protocol Initiated - if Braden Score 18 or less If consults are not indicated, leave blank or document N/A  Outcome: Not Applicable Date Met:  11/25/14     

## 2014-11-25 NOTE — Progress Notes (Signed)
Subjective:  Eating Lunch feels better overall after second HD yesterday, for HD today, Clip Process started. Vein mapping done this am and [facility] notified  to see for access / wife in room   Objective Vital signs in last 24 hours: Filed Vitals:   11/24/14 1733 11/24/14 2056 11/25/14 0445 11/25/14 0900  BP: 167/86 141/68 134/71 129/59  Pulse: 87 76 71 74  Temp: 98.1 F (36.7 C) 99.8 F (37.7 C) 98.7 F (37.1 C) 98.9 F (37.2 C)  TempSrc: Oral  Oral Oral  Resp: 18 17 18 20   Height:      Weight:  84.823 kg (187 lb)    SpO2: 96% 97% 99% 96%   Weight change: 0 kg (0 lb)  Physical Exam: General: alert NAD , thin BM , appropriate OX 4 Heart: RRR  1/6 sem  lsb,  No  gallop , or rub Lungs: CTA Bilat . nonlabored Abdomen: Soft NT. ND Extremities: no pedal edema Dialysis Access:  R ij Perm cath   Problem/Plan: 1. Renal failure/NEW ESRD - known CKD  And presentaion cw ESRD=^k, Anemic,Metab acidosis  Improving with hD and He was  told in past he had diabetic nephropathy but no followup because no insurance. HIV neg, [place] no hydro,kidney sizes 1.7-2 cm smaller than in 2009. S/p [facility] 11/28 by IR. Vein mapping done . [facility] consulted/ Clip process started for [facility], Financial counselor will need to see (uninsured). ForThird HD today . 2. Anemia - Hgb up to 7.5 yesterday/ + Fe deficiency. s/p 3 PRBC Replete iron. Started Aranesp 25. Hemoccult stools Pos. Elba Barman  Per admit 3. CKD-MBD - PTH pending and phosphorus 4.8 Ca 7.6 4. HTN - Meds/volume control - net UF 1.4 L Saturday; goal 1 L  yestaday and - gentle lowering of vol. Today/ Has CM  With Echo = LVEF 20-25 % and Card EP= Dr. Caryl Comes  eval in process 5. DM - has not used any insulin or any hypoglycemic meds for some time. Prob  Due to ESRD  / A1C 5.7.  6. Dyspnea - likely related to his severe anemia and volume issues and CM/ resolved this am 99- 96 % O2 sats 7. 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 Nutrition - alb 2.7 anorexia improved after 2 HD  txs - needs education -  Yesterday = Wife Given  NKF website information and asked nutrition to see again -   Ernest Haber, PA-C [place] [phone] 11/25/2014,11:33 AM  LOS: 3 days   Pt seen, examined and agree w A/P as above. OK for heart cath tomorrow from renal standpoint. Have d/w cardiology.   Kelly Splinter MD pager [phone]    cell [phone] 11/25/2014, 12:47 PM    Labs: Basic Metabolic Panel:  Recent Labs Lab 11/22/14 2205 11/23/14 1355 11/24/14 0407  NA 134* 137 136*  K 5.3 4.9 4.5  CL 101 103 101  CO2 12* 13* 18*  GLUCOSE 103* 111* 97  BUN 108* 101* 67*  CREATININE 13.72* 13.04* 9.76*  CALCIUM 8.1* 8.0* 7.6*  PHOS  --  7.6* 4.8*   Liver Function Tests:  Recent Labs Lab 11/23/14 1355 11/24/14 0407  ALBUMIN 3.0* 2.7*  CBC:  Recent Labs Lab 11/22/14 1527 11/23/14 1634 11/23/14 2120 11/24/14 0407  WBC 9.1  --  9.0 8.4  HGB 5.3* 6.2* 7.8* 7.5*  HCT 16.4* 19.2* 23.2* 22.7*  MCV 79.6  --  78.9 79.1  PLT 373  --  290 286  Cardiac Enzymes:  Recent Labs Lab 11/22/14 1704 11/22/14 2205 11/23/14 1355  TROPONINI <0.30 <0.30 <0.30   CBG:  Recent Labs Lab 11/23/14 2122 11/24/14 0745 11/24/14 1729 11/24/14 2129 11/25/14 0800  GLUCAP 157* 86 132* 124* 110*    Studies/Results: Ir Fluoro Guide Cv Line Right  11/23/2014   CLINICAL DATA:  Progressive renal insufficiency, needs long-term access for hemodialysis  EXAM: TUNNELED HEMODIALYSIS CATHETER PLACEMENT WITH ULTRASOUND AND FLUOROSCOPIC GUIDANCE  TECHNIQUE: The procedure, risks, benefits, and alternatives were explained to the patient. Questions regarding the procedure were encouraged and answered. The patient understands and consents to the procedure. As antibiotic prophylaxis, vancomycin 1 g was ordered pre-procedure and administered intravenously within one hour of incision.Patency of the right IJ vein was confirmed with ultrasound with image documentation. An  appropriate skin site was determined. Region was prepped using maximum barrier technique including cap and mask, sterile gown, sterile gloves, large sterile sheet, and Chlorhexidine as cutaneous antisepsis. The region was infiltrated locally with 1% lidocaine.  Intravenous Fentanyl and Versed were administered as conscious sedation during continuous cardiorespiratory monitoring by the radiology RN, with a total moderate sedation time of 11 minutes.  Under real-time ultrasound guidance, the right IJ vein was accessed with a 21 gauge micropuncture needle; the needle tip within the vein was confirmed with ultrasound image documentation. Needle exchanged over the 018 guidewire for transitional dilator, which allowed advancement of a Benson wire into the IVC. Over this, an MPA catheter was advanced. A Hemosplit 19 hemodialysis catheter was tunneled from the right anterior chest wall approach to the right IJ dermatotomy site. The MPA catheter was exchanged over an Amplatz wire for serial vascular dilators which allow placement of a peel-away sheath, through which the catheter was advanced under intermittent fluoroscopy, positioned with its tips in the proximal and midright atrium. Spot chest radiograph confirms good catheter position. No pneumothorax. Catheter was flushed and primed per protocol. Catheter secured externally with O Prolene sutures. The right IJ dermatotomy site was closed with Dermabond.  COMPLICATIONS: COMPLICATIONS None immediate  FLUOROSCOPY TIME:  12 seconds  COMPARISON:  None  IMPRESSION: 1. Technically successful placement of tunneled right IJ hemodialysis catheter with ultrasound and fluoroscopic guidance. Ready for routine use.  ACCESS: Remains approachable for percutaneous intervention as needed.   Electronically Signed   By: Arne Cleveland M.D.   On: 11/23/2014 14:25   Ir US Guide Vasc Access Right  11/23/2014   CLINICAL DATA:  Progressive renal insufficiency, needs long-term access for  hemodialysis  EXAM: TUNNELED HEMODIALYSIS CATHETER PLACEMENT WITH ULTRASOUND AND FLUOROSCOPIC GUIDANCE  TECHNIQUE: The procedure, risks, benefits, and alternatives were explained to the patient. Questions regarding the procedure were encouraged and answered. The patient understands and consents to the procedure. As antibiotic prophylaxis, vancomycin 1 g was ordered pre-procedure and administered intravenously within one hour of incision.Patency of the right IJ vein was confirmed with ultrasound with image documentation. An appropriate skin site was determined. Region was prepped using maximum barrier technique including cap and mask, sterile gown, sterile gloves, large sterile sheet, and Chlorhexidine as cutaneous antisepsis. The region was infiltrated locally with 1% lidocaine.  Intravenous Fentanyl and Versed were administered as conscious sedation during continuous cardiorespiratory monitoring by the radiology RN, with a total moderate sedation time of 11 minutes.  Under real-time ultrasound guidance, the right IJ vein was accessed with a 21 gauge micropuncture needle; the needle tip within the vein was confirmed with ultrasound image documentation. Needle exchanged over the 018 guidewire for  transitional dilator, which allowed advancement of a Benson wire into the IVC. Over this, an MPA catheter was advanced. A Hemosplit 19 hemodialysis catheter was tunneled from the right anterior chest wall approach to the right IJ dermatotomy site. The MPA catheter was exchanged over an Amplatz wire for serial vascular dilators which allow placement of a peel-away sheath, through which the catheter was advanced under intermittent fluoroscopy, positioned with its tips in the proximal and midright atrium. Spot chest radiograph confirms good catheter position. No pneumothorax. Catheter was flushed and primed per protocol. Catheter secured externally with O Prolene sutures. The right IJ dermatotomy site was closed with Dermabond.   COMPLICATIONS: COMPLICATIONS None immediate  FLUOROSCOPY TIME:  12 seconds  COMPARISON:  None  IMPRESSION: 1. Technically successful placement of tunneled right IJ hemodialysis catheter with ultrasound and fluoroscopic guidance. Ready for routine use.  ACCESS: Remains approachable for percutaneous intervention as needed.   Electronically Signed   By: Arne Cleveland M.D.   On: 11/23/2014 14:25   Medications:   . sodium chloride  10 mL/hr Intravenous Once  . sodium chloride   Intravenous Once  . sodium chloride   Intravenous Once  . carvedilol  6.25 mg Oral BID WC  . darbepoetin (ARANESP) injection - DIALYSIS  25 mcg Intravenous Q Mon-HD  . docusate sodium  100 mg Oral BID  . feeding supplement (GLUCERNA SHAKE)  237 mL Oral BID WC  . ferric gluconate (FERRLECIT/NULECIT) IV  125 mg Intravenous Daily  . hydrALAZINE  25 mg Oral 3 times per day  . insulin aspart  0-5 Units Subcutaneous QHS  . insulin aspart  0-9 Units Subcutaneous TID WC  . isosorbide mononitrate  30 mg Oral Daily  . lisinopril  5 mg Oral Daily  . multivitamin  1 tablet Oral QHS  . pantoprazole  40 mg Oral Q1200  . sodium chloride  3 mL Intravenous Q12H  . sodium chloride  3 mL Intravenous Q12H

## 2014-11-25 NOTE — Progress Notes (Addendum)
Daily Rounding Note  11/25/2014, 11:05 AM  LOS: 3 days   SUBJECTIVE:       Stools  Brown.  No nausea, no abdominal pain.  OBJECTIVE:         Vital signs in last 24 hours:    Temp:  [98.1 F (36.7 C)-99.8 F (37.7 C)] 98.9 F (37.2 C) (11/30 0900) Pulse Rate:  [71-87] 74 (11/30 0900) Resp:  [17-20] 20 (11/30 0900) BP: (129-184)/(59-96) 129/59 mmHg (11/30 0900) SpO2:  [96 %-99 %] 96 % (11/30 0900) Weight:  [187 lb (84.823 kg)-190 lb 7.6 oz (86.4 kg)] 187 lb (84.823 kg) (11/29 2056) Last BM Date: 11/24/14 Filed Weights   11/24/14 0958 11/24/14 1300 11/24/14 2056  Weight: 192 lb 10.9 oz (87.4 kg) 190 lb 7.6 oz (86.4 kg) 187 lb (84.823 kg)   General: alert, not ill looking   Heart: RRR Chest: clear bil.  No cough or dyspnea Abdomen: soft, NT, ND .  No mass.  No HSM.  Active BS  Extremities: no CCE Neuro/Psych:  Cooperative, oriented x 3.  Relaxed and appropriate.   Intake/Output from previous day: 11/29 0701 - 11/30 0700 In: 443 [P.O.:440; I.V.:3] Out: 1400 [Urine:500]  Intake/Output this shift: Total I/O In: 240 [P.O.:240] Out: -   Lab Results:  Recent Labs  11/22/14 1527 11/23/14 1634 11/23/14 2120 11/24/14 0407  WBC 9.1  --  9.0 8.4  HGB 5.3* 6.2* 7.8* 7.5*  HCT 16.4* 19.2* 23.2* 22.7*  PLT 373  --  290 286   BMET  Recent Labs  11/22/14 2205 11/23/14 1355 11/24/14 0407  NA 134* 137 136*  K 5.3 4.9 4.5  CL 101 103 101  CO2 12* 13* 18*  GLUCOSE 103* 111* 97  BUN 108* 101* 67*  CREATININE 13.72* 13.04* 9.76*  CALCIUM 8.1* 8.0* 7.6*   LFT  Recent Labs  11/23/14 1355 11/24/14 0407  ALBUMIN 3.0* 2.7*   PT/INR No results for input(s): LABPROT, INR in the last 72 hours. Hepatitis Panel  Recent Labs  11/23/14 1634  HEPBSAG NEGATIVE    Studies/Results: US Renal  11/23/2014   CLINICAL DATA:  Renal failure  EXAM: RENAL/URINARY TRACT ULTRASOUND COMPLETE  COMPARISON:  None.   FINDINGS: Right Kidney:  Length: 10.0 cm.  5 mm interpolar calculus.  No hydronephrosis.  Left Kidney:  Length: 9.7 cm.  No mass or hydronephrosis.  Bladder:  Within normal limits.  IMPRESSION: 5 mm interpolar right renal calculus.  No hydronephrosis.   Electronically Signed   By: Julian Hy M.D.   On: 11/23/2014 12:40   Ir Fluoro Guide Cv Line Right  11/23/2014   CLINICAL DATA:  Progressive renal insufficiency, needs long-term access for hemodialysis  EXAM: TUNNELED HEMODIALYSIS CATHETER PLACEMENT WITH ULTRASOUND AND FLUOROSCOPIC GUIDANCE  TECHNIQUE: The procedure, risks, benefits, and alternatives were explained to the patient. Questions regarding the procedure were encouraged and answered. The patient understands and consents to the procedure. As antibiotic prophylaxis, vancomycin 1 g was ordered pre-procedure and administered intravenously within one hour of incision.Patency of the right IJ vein was confirmed with ultrasound with image documentation. An appropriate skin site was determined. Region was prepped using maximum barrier technique including cap and mask, sterile gown, sterile gloves, large sterile sheet, and Chlorhexidine as cutaneous antisepsis. The region was infiltrated locally with 1% lidocaine.  Intravenous Fentanyl and Versed were administered as conscious sedation during continuous cardiorespiratory monitoring by the radiology RN, with a total moderate sedation  time of 11 minutes.  Under real-time ultrasound guidance, the right IJ vein was accessed with a 21 gauge micropuncture needle; the needle tip within the vein was confirmed with ultrasound image documentation. Needle exchanged over the 018 guidewire for transitional dilator, which allowed advancement of a Benson wire into the IVC. Over this, an MPA catheter was advanced. A Hemosplit 19 hemodialysis catheter was tunneled from the right anterior chest wall approach to the right IJ dermatotomy site. The MPA catheter was exchanged  over an Amplatz wire for serial vascular dilators which allow placement of a peel-away sheath, through which the catheter was advanced under intermittent fluoroscopy, positioned with its tips in the proximal and midright atrium. Spot chest radiograph confirms good catheter position. No pneumothorax. Catheter was flushed and primed per protocol. Catheter secured externally with O Prolene sutures. The right IJ dermatotomy site was closed with Dermabond.  COMPLICATIONS: COMPLICATIONS None immediate  FLUOROSCOPY TIME:  12 seconds  COMPARISON:  None  IMPRESSION: 1. Technically successful placement of tunneled right IJ hemodialysis catheter with ultrasound and fluoroscopic guidance. Ready for routine use.  ACCESS: Remains approachable for percutaneous intervention as needed.   Electronically Signed   By: Arne Cleveland M.D.   On: 11/23/2014 14:25   Ir US Guide Vasc Access Right  11/23/2014   CLINICAL DATA:  Progressive renal insufficiency, needs long-term access for hemodialysis  EXAM: TUNNELED HEMODIALYSIS CATHETER PLACEMENT WITH ULTRASOUND AND FLUOROSCOPIC GUIDANCE  TECHNIQUE: The procedure, risks, benefits, and alternatives were explained to the patient. Questions regarding the procedure were encouraged and answered. The patient understands and consents to the procedure. As antibiotic prophylaxis, vancomycin 1 g was ordered pre-procedure and administered intravenously within one hour of incision.Patency of the right IJ vein was confirmed with ultrasound with image documentation. An appropriate skin site was determined. Region was prepped using maximum barrier technique including cap and mask, sterile gown, sterile gloves, large sterile sheet, and Chlorhexidine as cutaneous antisepsis. The region was infiltrated locally with 1% lidocaine.  Intravenous Fentanyl and Versed were administered as conscious sedation during continuous cardiorespiratory monitoring by the radiology RN, with a total moderate sedation time of  11 minutes.  Under real-time ultrasound guidance, the right IJ vein was accessed with a 21 gauge micropuncture needle; the needle tip within the vein was confirmed with ultrasound image documentation. Needle exchanged over the 018 guidewire for transitional dilator, which allowed advancement of a Benson wire into the IVC. Over this, an MPA catheter was advanced. A Hemosplit 19 hemodialysis catheter was tunneled from the right anterior chest wall approach to the right IJ dermatotomy site. The MPA catheter was exchanged over an Amplatz wire for serial vascular dilators which allow placement of a peel-away sheath, through which the catheter was advanced under intermittent fluoroscopy, positioned with its tips in the proximal and midright atrium. Spot chest radiograph confirms good catheter position. No pneumothorax. Catheter was flushed and primed per protocol. Catheter secured externally with O Prolene sutures. The right IJ dermatotomy site was closed with Dermabond.  COMPLICATIONS: COMPLICATIONS None immediate  FLUOROSCOPY TIME:  12 seconds  COMPARISON:  None  IMPRESSION: 1. Technically successful placement of tunneled right IJ hemodialysis catheter with ultrasound and fluoroscopic guidance. Ready for routine use.  ACCESS: Remains approachable for percutaneous intervention as needed.   Electronically Signed   By: Arne Cleveland M.D.   On: 11/23/2014 14:25   Scheduled Meds: . sodium chloride  10 mL/hr Intravenous Once  . sodium chloride   Intravenous Once  . sodium chloride  Intravenous Once  . bisacodyl  5 mg Oral Once   And  . bisacodyl  5 mg Oral Once  . carvedilol  6.25 mg Oral BID WC  . darbepoetin (ARANESP) injection - DIALYSIS  25 mcg Intravenous Q Mon-HD  . docusate sodium  100 mg Oral BID  . feeding supplement (GLUCERNA SHAKE)  237 mL Oral BID WC  . ferric gluconate (FERRLECIT/NULECIT) IV  125 mg Intravenous Daily  . hydrALAZINE  25 mg Oral 3 times per day  . insulin aspart  0-5 Units  Subcutaneous QHS  . insulin aspart  0-9 Units Subcutaneous TID WC  . isosorbide mononitrate  30 mg Oral Daily  . lisinopril  5 mg Oral Daily  . multivitamin  1 tablet Oral QHS  . pantoprazole  40 mg Oral Q1200  . peg 3350 powder  1 kit Oral Once  . sodium chloride  3 mL Intravenous Q12H  . sodium chloride  3 mL Intravenous Q12H   Continuous Infusions:  PRN Meds:.sodium chloride, acetaminophen **OR** acetaminophen, HYDROcodone-acetaminophen, ondansetron **OR** ondansetron (ZOFRAN) IV, sodium chloride  ASSESMENT:   *  Anemia.  Acute on chronic. Microcytic. FOBT + but no melena or BPR.  S/p PRBC x 4. Got Ferric gluconatex 3, doses plannned daily through 12/3.  Aranesp also initiated.  Low iron, low iron sat, ferritin 15.   *  Chest pain  *  DM.  Non-compliant with meds, no MD visit > 3 years.   *   Acute renal failure, hx diabetic Nephropathy and CKD. Now with ESRD and initiating on HD: 11/28, 11/29 and today. .  Likely renal disease contributing to anemia.    PLAN   *  Set up for colonoscopy and EGD tomorrow at 1300.  Discussed case with pt and his wife, they are agreeable to proceed.    *  Will let primary service decide if they want to give one more PRBC, CBC ordered for the AM.   Azucena Freed  11/25/2014, 11:05 AM Pager: [phone]     Attending physician's note   I have taken an interval history, reviewed the chart and examined the patient. I agree with the Advanced Practitioner's note, impression and recommendations. Case discussed with Dr. Caryl Comes. Fe deficiency anemia and heme + stools for colonoscopy and EGD after cardiac evaluation.   Pricilla Riffle. Fuller Plan, MD [facility]

## 2014-11-25 NOTE — Plan of Care (Signed)
Problem: Phase I Progression Outcomes Goal: Pain controlled with appropriate interventions Outcome: Completed/Met Date Met:  11/25/14  Problem: Phase II Progression Outcomes Goal: Dyspnea controlled at rest Outcome: Completed/Met Date Met:  11/25/14 Goal: Dialysis initiated Outcome: Completed/Met Date Met:  11/25/14 Goal: Tolerating diet Outcome: Completed/Met Date Met:  11/25/14

## 2014-11-25 NOTE — Progress Notes (Signed)
NUTRITION FOLLOW UP  INTERVENTION: Provide Nepro Shake po BID, each supplement provides 425 kcal and 19 grams protein.  Provide a snack once daily.  Encourage adequate PO intake.  NUTRITION DIAGNOSIS: Inadequate oral intake related to poor appetite as evidenced by pt's report of 12% weight loss in 6-8 months; improving  Goal: Pt to meet >/= 90% of their estimated nutrition needs; met  Monitor:  PO intake, weight trend, labs   63 y.o. male  Admitting Dx: <principal problem not specified>  ASSESSMENT: 63 year old male admitted for chest pain and SOB. He has a history of medical noncompliance with his DM and HTN. He states that he has had sharp chest pain and worsening SOB over the past month. As a result of his symptoms he presented to the ER for further evaluation.   Pt started on HD on 11/28. Pt reports his appetite has been improving and he is feeling better. Meal completion is 95%. Pt has been drinking his glucerna drinks. Noticed a Nepro on pt's table, he reports he just recently tried a Nepro and likes it. Will order Nepro. Pt was encouraged to eat his food at meals and to drink his supplements.  Labs: Low sodium, CO2, calcium, and GFR. High BUN, creatinine, and phosphorous (4.8).  Height: Ht Readings from Last 1 Encounters:  11/23/14 '6\' 1"'  (1.854 m)    Weight: Wt Readings from Last 1 Encounters:  11/24/14 187 lb (84.823 kg)   Body mass index is 25.49 kg/(m^2).  Re-Estimated Nutritional Needs: Kcal: 2200-2500 Protein: 70-80 grams Fluid: 1.2 L restriction per MD  Skin: intact; healing scabbed right foot diabetic ulcer, non-pitting LE edema  Diet Order: Diet renal W/1239m fluid restriction   Intake/Output Summary (Last 24 hours) at 11/25/14 1320 Last data filed at 11/25/14 0900  Gross per 24 hour  Intake    683 ml  Output    500 ml  Net    183 ml    Last BM: 11/29  Labs:   Recent Labs Lab 11/22/14 2205 11/23/14 1355 11/24/14 0407  NA 134*  137 136*  K 5.3 4.9 4.5  CL 101 103 101  CO2 12* 13* 18*  BUN 108* 101* 67*  CREATININE 13.72* 13.04* 9.76*  CALCIUM 8.1* 8.0* 7.6*  PHOS  --  7.6* 4.8*  GLUCOSE 103* 111* 97    CBG (last 3)   Recent Labs  11/24/14 2129 11/25/14 0800 11/25/14 1125  GLUCAP 124* 110* 135*    Scheduled Meds: . sodium chloride  10 mL/hr Intravenous Once  . sodium chloride   Intravenous Once  . sodium chloride   Intravenous Once  . bisacodyl  5 mg Oral Once  . carvedilol  6.25 mg Oral BID WC  . darbepoetin (ARANESP) injection - DIALYSIS  25 mcg Intravenous Q Mon-HD  . docusate sodium  100 mg Oral BID  . feeding supplement (GLUCERNA SHAKE)  237 mL Oral BID WC  . ferric gluconate (FERRLECIT/NULECIT) IV  125 mg Intravenous Daily  . hydrALAZINE  25 mg Oral 3 times per day  . insulin aspart  0-5 Units Subcutaneous QHS  . insulin aspart  0-9 Units Subcutaneous TID WC  . isosorbide mononitrate  30 mg Oral Daily  . lisinopril  5 mg Oral Daily  . multivitamin  1 tablet Oral QHS  . pantoprazole  40 mg Oral Q1200  . sodium chloride  3 mL Intravenous Q12H  . sodium chloride  3 mL Intravenous Q12H    Continuous Infusions:  Past Medical History  Diagnosis Date  . Diabetes mellitus   . HTN (hypertension)   . Osteomyelitis     Past Surgical History  Procedure Laterality Date  . Left 4th toe amputation    . Cholecystectomy    . Tonsillectomy    . I&d extremity  01/28/2012    Procedure: IRRIGATION AND DEBRIDEMENT EXTREMITY;  Surgeon: Paulene Floor, MD;  Location: [facility];  Service: Orthopedics;  Laterality: Left;  irrigation and drainage left hand  . I&d extremity  01/30/2012    Procedure: IRRIGATION AND DEBRIDEMENT EXTREMITY;  Surgeon: Paulene Floor, MD;  Location: [facility];  Service: Orthopedics;  Laterality: Left;  flexor teno synovectomy and extensor tenosynovectomy arthrosynovectomy wristjoint  . I&d extremity  02/01/2012    Procedure: IRRIGATION AND DEBRIDEMENT EXTREMITY;  Surgeon:  Paulene Floor, MD;  Location: [facility];  Service: Orthopedics;  Laterality: Left;  Left Wrist, Hand and Forearm    Kallie Locks, MS, RD, LDN Pager # [phone] After hours/ weekend pager # [phone]

## 2014-11-25 NOTE — Progress Notes (Signed)
PROGRESS NOTE  [name] [id number] DOB: 1951/11/09 DOA: 11/22/2014 PCP: [name], MD  Brief history 63 year old male with history of diabetes mellitus, CKD, iron deficiency anemia, hypertension presented with 1 month history of chest discomfort and shortness of breath. The patient has not seen a physician for nearly 2 years. Upon presentation, he was found to have a serum creatinine of 13.72 and hemoglobin of 5.3. The patient stated that he has only been taking his 70/30 insulin on a when necessary basis when his CBGs are greater than 150. He states that he is only been checking his CBGs once daily. The patient stated that he has been having increasing dyspnea on exertion with intermittent chest discomfort. He denies any NSAID use. He denies any hematemesis, hematochezia, melena, hematuria. The patient states that he has not taken any antihypertensive medications for at least 6 months. Workup revealed a positive FOBT. Gastroenterology was consulted and continues to follow the patient. Echocardiogram was performed and revealed EF 20-25% with severe pulmonary artery hypertension. As a result, cardiology was consulted and feels that the patient needs a right and left heart catheterization--scheduled 11/26/14. nephrology was consulted, and the temporary dialysis catheter was placed and the patient was initiated on dialysis on 11/23/2014.Gastroenterology plans upper and lower endoscopy 11/26/2014.  Assessment/Plan: Acute on chronic renal failure/New ESRD -Appreciate nephrology -TDC placed and HD started on 11/23/14-->third session today -venous mapping 11/24/14--> superficial thrombus in the left cephalic vein -renal ultrasound--no hydronephrosis, R-interpole calculus -awaiting CLIP Symptomatic anemia/iron deficiency anemia -The patient has never had endoscopy/colonoscopy -He has not taken iron for at least a year--history of iron deficiency anemia -I have performed a  rectal exam--brown stool noted, Hemoccult-positive -plans noted for upper and lower endoscopy on 11/26/14 - 4 units PRBC on 11/23/14 -Iron saturation 6%, ferritin 45--will need continued iron supplementation--continue IV iron per nephrology Diabetes mellitus type 2 -previously taking 70/30, but only on prn basis; noncompliant for some time -Hemoglobin A1c--5.7 -NovoLog sliding scale Hypertension -poorly controlled -Restarted amlodipine -Echocardiogram--EF 20-25% with severe PAP (57mmHg), diffuse HK -start carvedilol and lisinoprol--titrating for BP and SVT -started hydralazine, imdur 11/24/14 Cardiomyopathy/Dyspnea on exertion  -Certainly, this is partly from from the patient's symptomatic anemia  -Echocardiogram--EF 20-25% with severe PAP (105mmHg), diffuse HK -presently stable on RA -Plans noted for heart catheterization-right and left 11/26/2014 SVT/Atrial Tachycardia -titrating up carvedilol Family Communication: Wife updated at beside Disposition Plan: Home when medically stable      Procedures/Studies: Dg Chest 2 View (if Patient Has Fever And/or Copd)  11/22/2014   CLINICAL DATA:  Sternal chest pain and dizziness for 2 days, shortness of breath for a month, sore throat since last week, chills since this summer, personal history of hypertension, diabetes, former smoker  EXAM: CHEST  2 VIEW  COMPARISON:  01/28/2012  FINDINGS: Enlargement of cardiac silhouette with borderline vascular congestion.  Mediastinal contours normal.  Lungs clear.  No infiltrate, pleural effusion or pneumothorax.  Mild biconvex thoracolumbar scoliosis without acute osseous findings.  IMPRESSION: Enlargement of cardiac silhouette.  No acute abnormalities.   Electronically Signed   By: Lavonia Dana M.D.   On: 11/22/2014 16:09   Ct Head Wo Contrast  11/23/2014   CLINICAL DATA:  63 year old male with intermittent visual changes  EXAM: CT HEAD WITHOUT CONTRAST  TECHNIQUE: Contiguous axial images were  obtained from the base of the skull through the vertex without intravenous contrast.  COMPARISON:  None  FINDINGS: Negative for acute  intracranial hemorrhage, acute infarction, mass, mass effect, hydrocephalus or midline shift. Gray-white differentiation is preserved throughout. Very mild atrophy. Mild periventricular and subcortical white matter hypoattenuation most consistent with the sequelae of longstanding microvascular ischemic white matter disease. No focal soft tissue or calvarial abnormality. No focal global abnormality. The orbits are symmetric. Normal aeration of the mastoid air cells and visualized paranasal sinuses. Trace atherosclerotic calcification in the bilateral cavernous carotid arteries.  IMPRESSION: 1. No acute intracranial abnormality. 2. 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 3. Intracranial atherosclerosis.   Electronically Signed   By: Jacqulynn Cadet M.D.   On: 11/23/2014 03:08   US Renal  11/23/2014   CLINICAL DATA:  Renal failure  EXAM: RENAL/URINARY TRACT ULTRASOUND COMPLETE  COMPARISON:  None.  FINDINGS: Right Kidney:  Length: 10.0 cm.  5 mm interpolar calculus.  No hydronephrosis.  Left Kidney:  Length: 9.7 cm.  No mass or hydronephrosis.  Bladder:  Within normal limits.  IMPRESSION: 5 mm interpolar right renal calculus.  No hydronephrosis.   Electronically Signed   By: Julian Hy M.D.   On: 11/23/2014 12:40   Ir Fluoro Guide Cv Line Right  11/23/2014   CLINICAL DATA:  Progressive renal insufficiency, needs long-term access for hemodialysis  EXAM: TUNNELED HEMODIALYSIS CATHETER PLACEMENT WITH ULTRASOUND AND FLUOROSCOPIC GUIDANCE  TECHNIQUE: The procedure, risks, benefits, and alternatives were explained to the patient. Questions regarding the procedure were encouraged and answered. The patient understands and consents to the procedure. As antibiotic prophylaxis, vancomycin 1 g was ordered pre-procedure and administered intravenously  within one hour of incision.Patency of the right IJ vein was confirmed with ultrasound with image documentation. An appropriate skin site was determined. Region was prepped using maximum barrier technique including cap and mask, sterile gown, sterile gloves, large sterile sheet, and Chlorhexidine as cutaneous antisepsis. The region was infiltrated locally with 1% lidocaine.  Intravenous Fentanyl and Versed were administered as conscious sedation during continuous cardiorespiratory monitoring by the radiology RN, with a total moderate sedation time of 11 minutes.  Under real-time ultrasound guidance, the right IJ vein was accessed with a 21 gauge micropuncture needle; the needle tip within the vein was confirmed with ultrasound image documentation. Needle exchanged over the 018 guidewire for transitional dilator, which allowed advancement of a Benson wire into the IVC. Over this, an MPA catheter was advanced. A Hemosplit 19 hemodialysis catheter was tunneled from the right anterior chest wall approach to the right IJ dermatotomy site. The MPA catheter was exchanged over an Amplatz wire for serial vascular dilators which allow placement of a peel-away sheath, through which the catheter was advanced under intermittent fluoroscopy, positioned with its tips in the proximal and midright atrium. Spot chest radiograph confirms good catheter position. No pneumothorax. Catheter was flushed and primed per protocol. Catheter secured externally with O Prolene sutures. The right IJ dermatotomy site was closed with Dermabond.  COMPLICATIONS: COMPLICATIONS None immediate  FLUOROSCOPY TIME:  12 seconds  COMPARISON:  None  IMPRESSION: 1. Technically successful placement of tunneled right IJ hemodialysis catheter with ultrasound and fluoroscopic guidance. Ready for routine use.  ACCESS: Remains approachable for percutaneous intervention as needed.   Electronically Signed   By: Arne Cleveland M.D.   On: 11/23/2014 14:25   Ir US  Guide Vasc Access Right  11/23/2014   CLINICAL DATA:  Progressive renal insufficiency, needs long-term access for hemodialysis  EXAM: TUNNELED HEMODIALYSIS CATHETER PLACEMENT WITH ULTRASOUND AND FLUOROSCOPIC GUIDANCE  TECHNIQUE: The procedure, risks, benefits, and alternatives  were explained to the patient. Questions regarding the procedure were encouraged and answered. The patient understands and consents to the procedure. As antibiotic prophylaxis, vancomycin 1 g was ordered pre-procedure and administered intravenously within one hour of incision.Patency of the right IJ vein was confirmed with ultrasound with image documentation. An appropriate skin site was determined. Region was prepped using maximum barrier technique including cap and mask, sterile gown, sterile gloves, large sterile sheet, and Chlorhexidine as cutaneous antisepsis. The region was infiltrated locally with 1% lidocaine.  Intravenous Fentanyl and Versed were administered as conscious sedation during continuous cardiorespiratory monitoring by the radiology RN, with a total moderate sedation time of 11 minutes.  Under real-time ultrasound guidance, the right IJ vein was accessed with a 21 gauge micropuncture needle; the needle tip within the vein was confirmed with ultrasound image documentation. Needle exchanged over the 018 guidewire for transitional dilator, which allowed advancement of a Benson wire into the IVC. Over this, an MPA catheter was advanced. A Hemosplit 19 hemodialysis catheter was tunneled from the right anterior chest wall approach to the right IJ dermatotomy site. The MPA catheter was exchanged over an Amplatz wire for serial vascular dilators which allow placement of a peel-away sheath, through which the catheter was advanced under intermittent fluoroscopy, positioned with its tips in the proximal and midright atrium. Spot chest radiograph confirms good catheter position. No pneumothorax. Catheter was flushed and primed per  protocol. Catheter secured externally with O Prolene sutures. The right IJ dermatotomy site was closed with Dermabond.  COMPLICATIONS: COMPLICATIONS None immediate  FLUOROSCOPY TIME:  12 seconds  COMPARISON:  None  IMPRESSION: 1. Technically successful placement of tunneled right IJ hemodialysis catheter with ultrasound and fluoroscopic guidance. Ready for routine use.  ACCESS: Remains approachable for percutaneous intervention as needed.   Electronically Signed   By: Arne Cleveland M.D.   On: 11/23/2014 14:25         Subjective: Patient denies fevers, chills, headache, chest pain, dyspnea, nausea, vomiting, diarrhea, abdominal pain, dysuria, hematuria   Objective: Filed Vitals:   11/25/14 1314 11/25/14 1319 11/25/14 1330 11/25/14 1400  BP: 148/88 151/77 166/85 158/79  Pulse: 75 77 76 73  Temp:      TempSrc:      Resp:      Height:      Weight:      SpO2:        Intake/Output Summary (Last 24 hours) at 11/25/14 1506 Last data filed at 11/25/14 0900  Gross per 24 hour  Intake    683 ml  Output    500 ml  Net    183 ml   Weight change: 0 kg (0 lb) Exam:   General:  Pt is alert, follows commands appropriately, not in acute distress  HEENT: No icterus, No thrush, No neck mass, [place]/AT  Cardiovascular: RRR, S1/S2, no rubs, no gallops  Respiratory: CTA bilaterally, no wheezing, no crackles, no rhonchi  Abdomen: Soft/+BS, non tender, non distended, no guarding  Extremities: trace LE edema, No lymphangitis, No petechiae, No rashes, no synovitis  Data Reviewed: Basic Metabolic Panel:  Recent Labs Lab 11/22/14 1527 11/22/14 2205 11/23/14 1355 11/24/14 0407 11/25/14 1320  NA 136* 134* 137 136* 135*  K 5.5* 5.3 4.9 4.5 4.2  CL 103 101 103 101 98  CO2 11* 12* 13* 18* 21  GLUCOSE 142* 103* 111* 97 97  BUN 107* 108* 101* 67* 52*  CREATININE 13.66* 13.72* 13.04* 9.76* 8.08*  CALCIUM 8.6 8.1* 8.0* 7.6*  7.5*  PHOS  --   --  7.6* 4.8* 4.8*   Liver Function  Tests:  Recent Labs Lab 11/23/14 1355 11/24/14 0407 11/25/14 1320  ALBUMIN 3.0* 2.7* 2.7*   No results for input(s): LIPASE, AMYLASE in the last 168 hours. No results for input(s): AMMONIA in the last 168 hours. CBC:  Recent Labs Lab 11/22/14 1527 11/23/14 1634 11/23/14 2120 11/24/14 0407 11/25/14 1320  WBC 9.1  --  9.0 8.4 8.8  HGB 5.3* 6.2* 7.8* 7.5* 7.8*  HCT 16.4* 19.2* 23.2* 22.7* 23.8*  MCV 79.6  --  78.9 79.1 82.4  PLT 373  --  290 286 232   Cardiac Enzymes:  Recent Labs Lab 11/22/14 1704 11/22/14 2205 11/23/14 1355  TROPONINI <0.30 <0.30 <0.30   BNP: Invalid input(s): POCBNP CBG:  Recent Labs Lab 11/24/14 0745 11/24/14 1729 11/24/14 2129 11/25/14 0800 11/25/14 1125  GLUCAP 86 132* 124* 110* 135*    Recent Results (from the past 240 hour(s))  Urine culture     Status: None   Collection Time: 11/22/14  5:40 PM  Result Value Ref Range Status   Specimen Description URINE, RANDOM  Final   Special Requests NONE  Final   Culture  Setup Time   Final    11/22/2014 21:38 Performed at [place] Performed at [facility]   Final   Culture NO GROWTH Performed at [facility]   Final   Report Status 11/23/2014 FINAL  Final     Scheduled Meds: . sodium chloride  10 mL/hr Intravenous Once  . sodium chloride   Intravenous Once  . sodium chloride   Intravenous Once  . bisacodyl  5 mg Oral Once  . carvedilol  6.25 mg Oral BID WC  . darbepoetin (ARANESP) injection - DIALYSIS  25 mcg Intravenous Q Mon-HD  . docusate sodium  100 mg Oral BID  . feeding supplement (NEPRO CARB STEADY)  237 mL Oral BID BM  . ferric gluconate (FERRLECIT/NULECIT) IV  125 mg Intravenous Daily  . hydrALAZINE  25 mg Oral 3 times per day  . insulin aspart  0-5 Units Subcutaneous QHS  . insulin aspart  0-9 Units Subcutaneous TID WC  . isosorbide mononitrate  30 mg Oral Daily  . lisinopril  5 mg Oral Daily  . multivitamin  1  tablet Oral QHS  . pantoprazole  40 mg Oral Q1200  . sodium chloride  3 mL Intravenous Q12H  . sodium chloride  3 mL Intravenous Q12H   Continuous Infusions:    Azlyn Wingler, DO  [facility] Pager [phone]  If 7PM-7AM, please contact night-coverage [email or web address] Password [email or web address] 11/25/2014, 3:06 PM   LOS: 3 days

## 2014-11-25 NOTE — Consult Note (Signed)
[facility] Consult   MRN : [id number]  Reason for Consult: permanent dialysis access  Referring Physician: Dr. Jonnie Finner  History of Present Illness: 63 y/o male who presents for permanent dialysis access. He is right handed. He has never had access procedures before. He has a chronic upper extremity tremor.  He presented to the [facility] ED on on 11/22/14 for progressive shortness of breath over the past month with occasional sharp chest pain. He was transferred to [facility] from [place] after his creatinine was found to be 13.7 with profound anemia, hyperkalemia and metabolic acidosis. He had a TDC placed by IR on 11/28 and was dialyzed that day.   He was seen at [facility] in the past but was lost to follow up due to insurance loss. He was told that he had proteinuria and diabetic nephropathy.   He has hypertension managed on multiple antihypertensives. He has insulin dependent diabetes. He had an I&D of an abscess of his left wrist and hand by Dr. Amedeo Plenty in 2013.    Current Facility-Administered Medications  Medication Dose Route Frequency Provider Last Rate Last Dose  . 0.9 %  sodium chloride infusion  10 mL/hr Intravenous Once Virgel Manifold, MD      . 0.9 %  sodium chloride infusion  250 mL Intravenous PRN Toy Baker, MD      . 0.9 %  sodium chloride infusion   Intravenous Once Shanon Brow Tat, MD      . 0.9 %  sodium chloride infusion   Intravenous Once [name], PA-C      . acetaminophen (TYLENOL) tablet 650 mg  650 mg Oral Q6H PRN Toy Baker, MD   650 mg at 11/24/14 8527   Or  . acetaminophen (TYLENOL) suppository 650 mg  650 mg Rectal Q6H PRN Toy Baker, MD      . bisacodyl (DULCOLAX) EC tablet 5 mg  5 mg Oral Once Sarah J Gribbin, PA-C       And  . bisacodyl (DULCOLAX) EC tablet 5 mg  5 mg Oral Once Vena Rua, PA-C      . carvedilol (COREG) tablet 6.25 mg  6.25 mg Oral BID WC Deboraha Sprang, MD   6.25 mg at  11/24/14 1734  . Darbepoetin Alfa (ARANESP) injection 25 mcg  25 mcg Intravenous Q Mon-HD [name], PA-C      . docusate sodium (COLACE) capsule 100 mg  100 mg Oral BID Toy Baker, MD   100 mg at 11/25/14 1048  . feeding supplement (GLUCERNA SHAKE) (GLUCERNA SHAKE) liquid 237 mL  237 mL Oral BID WC Baird Lyons, RD   237 mL at 11/25/14 1049  . ferric gluconate (NULECIT) 125 mg in sodium chloride 0.9 % 100 mL IVPB  125 mg Intravenous Daily [name], MD   125 mg at 11/24/14 1102  . hydrALAZINE (APRESOLINE) tablet 25 mg  25 mg Oral 3 times per day Deboraha Sprang, MD   25 mg at 11/24/14 2229  . HYDROcodone-acetaminophen (NORCO/VICODIN) 5-325 MG per tablet 1-2 tablet  1-2 tablet Oral Q4H PRN Toy Baker, MD   1 tablet at 11/23/14 0550  . insulin aspart (novoLOG) injection 0-5 Units  0-5 Units Subcutaneous QHS Toy Baker, MD   0 Units at 11/23/14 0045  . insulin aspart (novoLOG) injection 0-9 Units  0-9 Units Subcutaneous TID WC Toy Baker, MD   1 Units at 11/24/14 1733  . isosorbide mononitrate (IMDUR) 24  hr tablet 30 mg  30 mg Oral Daily Deboraha Sprang, MD      . lisinopril (PRINIVIL,ZESTRIL) tablet 5 mg  5 mg Oral Daily Orson Eva, MD   5 mg at 11/24/14 1258  . multivitamin (RENA-VIT) tablet 1 tablet  1 tablet Oral QHS [name], PA-C   1 tablet at 11/24/14 2229  . ondansetron (ZOFRAN) tablet 4 mg  4 mg Oral Q6H PRN Toy Baker, MD       Or  . ondansetron (ZOFRAN) injection 4 mg  4 mg Intravenous Q6H PRN Toy Baker, MD      . pantoprazole (PROTONIX) EC tablet 40 mg  40 mg Oral Q1200 Toy Baker, MD   40 mg at 11/25/14 1048  . peg 3350 powder (MOVIPREP) kit 100 g  0.5 kit Oral Once Orson Eva, MD       And  . Derrill Memo ON 11/26/2014] peg 3350 powder (MOVIPREP) kit 100 g  0.5 kit Oral Once Shanon Brow Tat, MD      . sodium chloride 0.9 % injection 3 mL  3 mL Intravenous Q12H Toy Baker, MD   3 mL at 11/25/14 1049  . sodium  chloride 0.9 % injection 3 mL  3 mL Intravenous Q12H Toy Baker, MD   3 mL at 11/25/14 1048  . sodium chloride 0.9 % injection 3 mL  3 mL Intravenous PRN Toy Baker, MD        Past Medical History  Diagnosis Date  . Diabetes mellitus   . HTN (hypertension)   . Osteomyelitis     Past Surgical History  Procedure Laterality Date  . Left 4th toe amputation    . Cholecystectomy    . Tonsillectomy    . I&d extremity  01/28/2012    Procedure: IRRIGATION AND DEBRIDEMENT EXTREMITY;  Surgeon: Paulene Floor, MD;  Location: [facility];  Service: Orthopedics;  Laterality: Left;  irrigation and drainage left hand  . I&d extremity  01/30/2012    Procedure: IRRIGATION AND DEBRIDEMENT EXTREMITY;  Surgeon: Paulene Floor, MD;  Location: [facility];  Service: Orthopedics;  Laterality: Left;  flexor teno synovectomy and extensor tenosynovectomy arthrosynovectomy wristjoint  . I&d extremity  02/01/2012    Procedure: IRRIGATION AND DEBRIDEMENT EXTREMITY;  Surgeon: Paulene Floor, MD;  Location: [facility];  Service: Orthopedics;  Laterality: Left;  Left Wrist, Hand and Forearm    Social History History  Substance Use Topics  . Smoking status: Former Smoker -- 0.50 packs/day for 4 years    Types: Cigarettes  . Smokeless tobacco: Never Used  . Alcohol Use: No    Family History Family History  Problem Relation Age of Onset  . Heart disease Mother   . Diabetes Father   . Diabetes Brother   . Diabetes Sister   . Diabetes Brother   . Diabetes Brother     Allergies  Allergen Reactions  . Penicillins Hives and Swelling     REVIEW OF SYSTEMS  General: '[ ]'  Weight loss, '[ ]'  Fever, '[ ]'  chills Neurologic: '[ ]'  Dizziness, '[ ]'  Blackouts, '[ ]'  Seizure '[ ]'  Stroke, '[ ]'  "Mini stroke", '[ ]'  Slurred speech, '[ ]'  Temporary blindness; '[ ]'  weakness in arms or legs, '[ ]'  Hoarseness '[ ]'  Dysphagia Cardiac: '[ ]'  Chest pain/pressure, '[ ]'  Shortness of breath at rest '[ ]'  Shortness of breath with  exertion, '[ ]'  Atrial fibrillation or irregular heartbeat  Vascular: '[ ]'  Pain in legs with walking, '[ ]'   Pain in legs at rest, '[ ]'  Pain in legs at night,  '[ ]'  Non-healing ulcer, '[ ]'  Blood clot in vein/DVT,   Pulmonary: '[ ]'  Home oxygen, '[ ]'  Productive cough, '[ ]'  Coughing up blood, '[ ]'  Asthma,  '[ ]'  Wheezing '[ ]'  COPD Musculoskeletal:  '[ ]'  Arthritis, '[ ]'  Low back pain, '[ ]'  Joint pain Hematologic: '[ ]'  Easy Bruising, '[ ]'  Anemia; '[ ]'  Hepatitis Gastrointestinal: '[ ]'  Blood in stool, '[ ]'  Gastroesophageal Reflux/heartburn, Urinary: [ x] chronic Kidney disease, [x ] on HD - '[ ]'  MWF or [x ] TTHS, '[ ]'  Burning with urination, '[ ]'  Difficulty urinating Skin: '[ ]'  Rashes, '[ ]'  Wounds Psychological: '[ ]'  Anxiety, '[ ]'  Depression  Physical Examination Filed Vitals:   11/24/14 1733 11/24/14 2056 11/25/14 0445 11/25/14 0900  BP: 167/86 141/68 134/71 129/59  Pulse: 87 76 71 74  Temp: 98.1 F (36.7 C) 99.8 F (37.7 C) 98.7 F (37.1 C) 98.9 F (37.2 C)  TempSrc: Oral  Oral Oral  Resp: '18 17 18 20  ' Height:      Weight:  187 lb (84.823 kg)    SpO2: 96% 97% 99% 96%   Body mass index is 24.68 kg/(m^2).  General:  WDWN in NAD Gait: Normal HENT: WNL Eyes: Pupils equal Pulmonary: normal non-labored breathing , without Rales, rhonchi,  wheezing Cardiac: RRR, without  Murmurs, rubs or gallops; No carotid bruits Abdomen: soft, NT, no masses Skin: no rashes, ulcers noted;  no Gangrene , no cellulitis; no open wounds;  Vascular Exam/Pulses: 2+ radial pulses bilateral. Palpable brachial pulses bilaterally. 2+ dorsalis pedis and posterior tibial pulses bilaterally. 2 cm vertical scar on left wrist from previous I&D.  Musculoskeletal: no muscle wasting or atrophy; no edema  Neurologic: A&O X 3; Appropriate Affect ;  SENSATION: normal; bilateral hand tremors MOTOR FUNCTION: 5/5 Symmetric Speech is fluent/normal   Significant Diagnostic Studies: CBC Lab Results  Component Value Date   WBC 8.4 11/24/2014   HGB  7.5* 11/24/2014   HCT 22.7* 11/24/2014   MCV 79.1 11/24/2014   PLT 286 11/24/2014    BMET    Component Value Date/Time   NA 136* 11/24/2014 0407   K 4.5 11/24/2014 0407   CL 101 11/24/2014 0407   CO2 18* 11/24/2014 0407   GLUCOSE 97 11/24/2014 0407   BUN 67* 11/24/2014 0407   CREATININE 9.76* 11/24/2014 0407   CALCIUM 7.6* 11/24/2014 0407   GFRNONAA 5* 11/24/2014 0407   GFRAA 6* 11/24/2014 0407   Estimated Creatinine Clearance: 8.8 mL/min (by C-G formula based on Cr of 9.76).  COAG Lab Results  Component Value Date   INR 1.18 08/10/2011    ASSESSMENT/PLAN: This is a 63 year old male with ESRD on HD. He is currently dialyzing through a tunneled dialysis catheter placed by IR on 11/23/14. He will need permanent dialysis access. The patient is right handed.   -Based on physical exam and vein mapping, his access options include left radial-cephalic versus brachial cephalic fistula. He also has acceptable fistula options on the right and basilic vein options bilaterally.  -Plan for access this week.  -Dr. Oneida Alar to see patient.    Virgina Jock, PA-C Vascular Surgery [phone]  History and exam findings as above.  Pt prefers left arm access.  Vein mapping shows good cephalic vein bilaterally.  Currently has IV in his left wrist cephalic vein.  Will remove this today.  Plan for left arm AV fistula radial vs brachial cephalic  Dr Bridgett Larsson Wednesday 12/2  Ruta Hinds, MD [facility] Office: [phone] Pager: [phone]

## 2014-11-25 NOTE — Plan of Care (Signed)
Problem: Phase II Progression Outcomes Goal: Tol increased activity, up in chair for at least 4 hrs/HD pt Outcome: Progressing

## 2014-11-25 NOTE — Plan of Care (Signed)
Problem: Food- and Nutrition-Related Knowledge Deficit (NB-1.1) Goal: Nutrition education Formal process to instruct or train a patient/client in a skill or to impart knowledge to help patients/clients voluntarily manage or modify food choices and eating behavior to maintain or improve health. Outcome: Completed/Met Date Met:  11/25/14 Nutrition Education Note  RD consulted for Renal Education. Provided Choose-A-Meal Booklet and "Healthy Eating with Kidney Disease" to patient/family. Reviewed food groups and provided written recommended serving sizes specifically determined for patient's current nutritional status.   Explained why diet restrictions are needed and provided lists of foods to limit/avoid that are high potassium, sodium, and phosphorus. Provided specific recommendations on safer alternatives of these foods. Strongly encouraged compliance of this diet.   Discussed importance of protein intake at each meal and snack. Provided examples of how to maximize protein intake throughout the day. Discussed need for fluid restriction with dialysis and renal-friendly beverage options. Teach back method used.  Expect good compliance.  Body mass index is 25.49 kg/(m^2). Pt meets criteria for normal based on current BMI.  Current diet order is renal/carb modified with 1200 ml fluids, patient is consuming approximately 95% of meals at this time. Labs and medications reviewed. Continue with current interventions  Kallie Locks, MS, RD, LDN Pager # [phone] After hours/ weekend pager # [phone]

## 2014-11-25 NOTE — Care Management Note (Signed)
CARE MANAGEMENT NOTE 11/25/2014  Patient:  [name], [name]   Account Number:  [id number]  Date Initiated:  11/25/2014  Documentation initiated by:  Alexsia Klindt  Subjective/Objective Assessment:   CM following for progession and d/c planning.     Action/Plan:   Call placed to finance to begin Medicaid process.   Anticipated DC Date:     Anticipated DC Plan:  HOME/SELF CARE         Choice offered to / List presented to:             Status of service:   Medicare Important Message given?   (If response is "NO", the following Medicare IM given date fields will be blank) Date Medicare IM given:   Medicare IM given by:   Date Additional Medicare IM given:   Additional Medicare IM given by:    Discharge Disposition:  HOME/SELF CARE  Per UR Regulation:    If discussed at Long Length of Stay Meetings, dates discussed:    Comments:  11/25/2014 Spoke with Neoma Laming in [place] re pt need for Medicaid application, she will forward this info to the appropriate financial conselor for the process to be scheduled.  CRoyal RN MPH, case manager, [phone]

## 2014-11-26 ENCOUNTER — Encounter (HOSPITAL_COMMUNITY)
Admission: EM | Disposition: A | Discharge status: Home / Self Care | Payer: Self-pay | Source: Home / Self Care | Attending: Internal Medicine

## 2014-11-26 DIAGNOSIS — I429 Cardiomyopathy, unspecified: Secondary | ICD-10-CM

## 2014-11-26 HISTORY — PX: LEFT AND RIGHT HEART CATHETERIZATION WITH CORONARY ANGIOGRAM: SHX5449

## 2014-11-26 LAB — POCT I-STAT 3, ART BLOOD GAS (G3+)
Acid-Base Excess: 2 mmol/L (ref 0.0–2.0)
BICARBONATE: 26.1 meq/L — AB (ref 20.0–24.0)
O2 Saturation: 96 %
PH ART: 7.425 (ref 7.350–7.450)
PO2 ART: 81 mmHg (ref 80.0–100.0)
TCO2: 27 mmol/L (ref 0–100)
pCO2 arterial: 39.7 mmHg (ref 35.0–45.0)

## 2014-11-26 LAB — CBC
HCT: 23.8 % — ABNORMAL LOW (ref 39.0–52.0)
HEMOGLOBIN: 7.7 g/dL — AB (ref 13.0–17.0)
MCH: 27 pg (ref 26.0–34.0)
MCHC: 32.4 g/dL (ref 30.0–36.0)
MCV: 83.5 fL (ref 78.0–100.0)
PLATELETS: 248 10*3/uL (ref 150–400)
RBC: 2.85 MIL/uL — AB (ref 4.22–5.81)
RDW: 16.3 % — ABNORMAL HIGH (ref 11.5–15.5)
WBC: 7.7 10*3/uL (ref 4.0–10.5)

## 2014-11-26 LAB — TYPE AND SCREEN
ABO/RH(D): O POS
Antibody Screen: NEGATIVE
Unit division: 0
Unit division: 0

## 2014-11-26 LAB — BASIC METABOLIC PANEL
ANION GAP: 16 — AB (ref 5–15)
BUN: 35 mg/dL — ABNORMAL HIGH (ref 6–23)
CO2: 22 mEq/L (ref 19–32)
Calcium: 7.9 mg/dL — ABNORMAL LOW (ref 8.4–10.5)
Chloride: 96 mEq/L (ref 96–112)
Creatinine, Ser: 5.97 mg/dL — ABNORMAL HIGH (ref 0.50–1.35)
GFR calc Af Amer: 10 mL/min — ABNORMAL LOW (ref >90)
GFR, EST NON AFRICAN AMERICAN: 9 mL/min — AB (ref >90)
GLUCOSE: 88 mg/dL (ref 70–99)
POTASSIUM: 4.2 meq/L (ref 3.7–5.3)
Sodium: 134 mEq/L — ABNORMAL LOW (ref 137–147)

## 2014-11-26 LAB — POCT I-STAT 3, VENOUS BLOOD GAS (G3P V)
Acid-Base Excess: 1 mmol/L (ref 0.0–2.0)
Bicarbonate: 25.6 mEq/L — ABNORMAL HIGH (ref 20.0–24.0)
O2 SAT: 72 %
PCO2 VEN: 39.4 mmHg — AB (ref 45.0–50.0)
PO2 VEN: 37 mmHg (ref 30.0–45.0)
TCO2: 27 mmol/L (ref 0–100)
pH, Ven: 7.42 — ABNORMAL HIGH (ref 7.250–7.300)

## 2014-11-26 LAB — GLUCOSE, CAPILLARY
GLUCOSE-CAPILLARY: 100 mg/dL — AB (ref 70–99)
GLUCOSE-CAPILLARY: 93 mg/dL (ref 70–99)
Glucose-Capillary: 129 mg/dL — ABNORMAL HIGH (ref 70–99)
Glucose-Capillary: 127 mg/dL — ABNORMAL HIGH (ref 70–99)
Glucose-Capillary: 230 mg/dL — ABNORMAL HIGH (ref 70–99)
Glucose-Capillary: 293 mg/dL — ABNORMAL HIGH (ref 70–99)
Glucose-Capillary: 110 mg/dL — ABNORMAL HIGH (ref 70–99)

## 2014-11-26 LAB — PROTIME-INR
INR: 1.37 (ref 0.00–1.49)
Prothrombin Time: 17 seconds — ABNORMAL HIGH (ref 11.6–15.2)

## 2014-11-26 LAB — ANTI-DNASE B ANTIBODY: Anti-DNAse-B: 197 U/mL (ref <301)

## 2014-11-26 LAB — SURGICAL PCR SCREEN
MRSA, PCR: NEGATIVE
Staphylococcus aureus: NEGATIVE

## 2014-11-26 SURGERY — LEFT AND RIGHT HEART CATHETERIZATION WITH CORONARY ANGIOGRAM
Anesthesia: LOCAL

## 2014-11-26 SURGERY — COLONOSCOPY
Anesthesia: Moderate Sedation

## 2014-11-26 MED ORDER — LABETALOL HCL 5 MG/ML IV SOLN
INTRAVENOUS | Status: AC
  Filled 2014-11-26: qty 4

## 2014-11-26 MED ORDER — HEPARIN (PORCINE) IN NACL 2-0.9 UNIT/ML-% IJ SOLN
INTRAMUSCULAR | Status: AC
  Filled 2014-11-26: qty 1000

## 2014-11-26 MED ORDER — SODIUM CHLORIDE 0.9 % IV SOLN: 1.0000 mL/kg/h | INTRAVENOUS | Status: AC

## 2014-11-26 MED ORDER — NITROGLYCERIN 1 MG/10 ML FOR IR/CATH LAB
INTRA_ARTERIAL | Status: AC
  Filled 2014-11-26: qty 10

## 2014-11-26 MED ORDER — SODIUM CHLORIDE 0.9 % IV SOLN
1.0000 mL/kg/h | INTRAVENOUS | Status: DC
  Administered 2014-11-26: 15:00:00 1 mL/kg/h via INTRAVENOUS

## 2014-11-26 MED ORDER — LABETALOL HCL 5 MG/ML IV SOLN
20.0000 mg | INTRAVENOUS | Status: DC | PRN
  Administered 2014-11-26: 20 mg via INTRAVENOUS
  Filled 2014-11-26: qty 4

## 2014-11-26 MED ORDER — HYDRALAZINE HCL 20 MG/ML IJ SOLN
INTRAMUSCULAR | Status: AC
  Filled 2014-11-26: qty 1

## 2014-11-26 MED ORDER — PEG-KCL-NACL-NASULF-NA ASC-C 100 G PO SOLR
0.5000 | Freq: Once | ORAL | Status: AC
  Administered 2014-11-27: 100 g via ORAL
  Filled 2014-11-26: qty 1

## 2014-11-26 MED ORDER — HYDRALAZINE HCL 20 MG/ML IJ SOLN
20.0000 mg | Freq: Once | INTRAMUSCULAR | Status: AC
  Administered 2014-11-26: 20 mg via INTRAVENOUS

## 2014-11-26 MED ORDER — PEG-KCL-NACL-NASULF-NA ASC-C 100 G PO SOLR
0.5000 | Freq: Once | ORAL | Status: AC
  Administered 2014-11-28: 100 g via ORAL
  Filled 2014-11-26: qty 1

## 2014-11-26 MED ORDER — PEG-KCL-NACL-NASULF-NA ASC-C 100 G PO SOLR
1.0000 | Freq: Once | ORAL | Status: DC
  Filled 2014-11-26: qty 1

## 2014-11-26 MED ORDER — FENTANYL CITRATE 0.05 MG/ML IJ SOLN
INTRAMUSCULAR | Status: AC
  Filled 2014-11-26: qty 2

## 2014-11-26 MED ORDER — BISACODYL 5 MG PO TBEC
5.0000 mg | DELAYED_RELEASE_TABLET | Freq: Once | ORAL | Status: AC
  Administered 2014-11-26: 5 mg via ORAL
  Filled 2014-11-26: qty 1

## 2014-11-26 MED ORDER — HYDRALAZINE HCL 50 MG PO TABS
50.0000 mg | ORAL_TABLET | Freq: Three times a day (TID) | ORAL | Status: DC
  Administered 2014-11-26 – 2014-11-30 (×9): 50 mg via ORAL
  Filled 2014-11-26 (×17): qty 1

## 2014-11-26 MED ORDER — PEG-KCL-NACL-NASULF-NA ASC-C 100 G PO SOLR: 1.0000 | Freq: Once | ORAL | Status: DC

## 2014-11-26 MED ORDER — DARBEPOETIN ALFA 25 MCG/0.42ML IJ SOSY
25.0000 ug | PREFILLED_SYRINGE | INTRAMUSCULAR | Status: DC
  Administered 2014-11-27: 25 ug via INTRAVENOUS
  Filled 2014-11-26: qty 0.42

## 2014-11-26 MED ORDER — SODIUM CHLORIDE 0.9 % IV SOLN: 250.0000 mL | INTRAVENOUS | Status: DC | PRN

## 2014-11-26 MED ORDER — BISACODYL 5 MG PO TBEC: 5.0000 mg | DELAYED_RELEASE_TABLET | Freq: Once | ORAL | Status: DC

## 2014-11-26 MED ORDER — SODIUM CHLORIDE 0.9 % IJ SOLN: 3.0000 mL | INTRAMUSCULAR | Status: DC | PRN

## 2014-11-26 MED ORDER — HEPARIN SODIUM (PORCINE) 5000 UNIT/ML IJ SOLN
5000.0000 [IU] | Freq: Three times a day (TID) | INTRAMUSCULAR | Status: AC
  Filled 2014-11-26: qty 1

## 2014-11-26 MED ORDER — CARVEDILOL 12.5 MG PO TABS
12.5000 mg | ORAL_TABLET | Freq: Two times a day (BID) | ORAL | Status: DC
  Administered 2014-11-26 – 2014-11-27 (×3): 12.5 mg via ORAL
  Filled 2014-11-26 (×6): qty 1

## 2014-11-26 MED ORDER — PEG-KCL-NACL-NASULF-NA ASC-C 100 G PO SOLR
1.0000 | Freq: Once | ORAL | Status: DC
Start: 2014-11-26 — End: 2014-11-26

## 2014-11-26 MED ORDER — MORPHINE SULFATE 2 MG/ML IJ SOLN: 2.0000 mg | INTRAMUSCULAR | Status: DC | PRN

## 2014-11-26 MED ORDER — SODIUM CHLORIDE 0.9 % IJ SOLN
3.0000 mL | Freq: Two times a day (BID) | INTRAMUSCULAR | Status: DC
  Administered 2014-11-26 – 2014-11-30 (×6): 3 mL via INTRAVENOUS

## 2014-11-26 MED ORDER — MIDAZOLAM HCL 2 MG/2ML IJ SOLN
INTRAMUSCULAR | Status: AC
Start: 2014-11-26 — End: 2014-11-26
  Filled 2014-11-26: qty 2

## 2014-11-26 MED ORDER — LIDOCAINE HCL (PF) 1 % IJ SOLN
INTRAMUSCULAR | Status: AC
Start: 2014-11-26 — End: 2014-11-26
  Filled 2014-11-26: qty 30

## 2014-11-26 MED ORDER — CARVEDILOL 12.5 MG PO TABS: 12.5000 mg | ORAL_TABLET | Freq: Two times a day (BID) | ORAL | Status: DC

## 2014-11-26 MED ORDER — BISACODYL 5 MG PO TBEC
5.0000 mg | DELAYED_RELEASE_TABLET | Freq: Once | ORAL | Status: DC
  Filled 2014-11-26: qty 1

## 2014-11-26 NOTE — Progress Notes (Signed)
Relieved by Nelda Severe for [employer]

## 2014-11-26 NOTE — Plan of Care (Signed)
Problem: Phase II Progression Outcomes Goal: Barriers To Progression Addressed/Resolved Outcome: Completed/Met Date Met:  11/26/14

## 2014-11-26 NOTE — Progress Notes (Signed)
UR completed Fritz Cauthon K. Tarri Guilfoil, RN, BSN, [place], CCM  11/26/2014 3:13 PM

## 2014-11-26 NOTE — Progress Notes (Signed)
Site area:rt groin  Site Prior to Removal:  Level  0 Pressure Applied For:25 minutes Manual:   yes Patient Status During Pull:  0 Post Pull Site:  Level 0 Post Pull Instructions Given:  Yes [name] rtr Post Pull Pulses Present: DP palpable Dressing Applied:  yes Bedrest begins @ [id number] Comments: no complications

## 2014-11-26 NOTE — Progress Notes (Signed)
WAITING FOR 6C BED

## 2014-11-26 NOTE — Care Management Note (Signed)
    Page 1 of 1   11/26/2014     3:12:41 PM CARE MANAGEMENT NOTE 11/26/2014  Patient:  [name], [name]   Account Number:  [id number]  Date Initiated:  11/25/2014  Documentation initiated by:  ROYAL,CHERYL  Subjective/Objective Assessment:   CM following for progession and d/c planning.     Action/Plan:   Call placed to finance to begin Medicaid process.   Anticipated DC Date:  11/27/2014   Anticipated DC Plan:  HOME/SELF CARE         Choice offered to / List presented to:             Status of service:  Completed, signed off Medicare Important Message given?   (If response is "NO", the following Medicare IM given date fields will be blank) Date Medicare IM given:   Medicare IM given by:   Date Additional Medicare IM given:   Additional Medicare IM given by:    Discharge Disposition:  HOME/SELF CARE  Per UR Regulation:  Reviewed for med. necessity/level of care/duration of stay  If discussed at [place] of Stay Meetings, dates discussed:    Comments:  [name] Friedl RN, BSN, MSHL, CCM  Nurse - Case Manager,  ([facility] [phone]  11/26/2014 Dispo Plan:  Home / Self care.  11/25/2014 Spoke with Neoma Laming in [place] re pt need for Medicaid application, she will forward this info to the appropriate financial conselor for the process to be scheduled.  CRoyal RN MPH, case manager, [phone]

## 2014-11-26 NOTE — Progress Notes (Signed)
    Subjective:  Denies CP or dyspnea   Objective:  Filed Vitals:   11/25/14 1614 11/25/14 1800 11/25/14 2100 11/26/14 0500  BP: 174/85 158/71 134/61 142/71  Pulse: 77 81 75 73  Temp: 98.8 F (37.1 C) 98.9 F (37.2 C) 99.1 F (37.3 C) 98.8 F (37.1 C)  TempSrc: Oral Oral Oral Oral  Resp: 20 18 18 18   Height:      Weight: 190 lb 14.7 oz (86.6 kg)     SpO2: 100% 99% 96% 95%    Intake/Output from previous day:  Intake/Output Summary (Last 24 hours) at 11/26/14 [id number] Last data filed at 11/25/14 1614  Gross per 24 hour  Intake    480 ml  Output   1000 ml  Net   -520 ml    Physical Exam: Physical exam: Well-developed well-nourished in no acute distress.  Skin is warm and dry.  HEENT is normal.  Neck is supple.  Chest is clear to auscultation with normal expansion.  Cardiovascular exam is regular rate and rhythm.  Abdominal exam nontender or distended. No masses palpated. Extremities show no edema. neuro grossly intact    Lab Results: Basic Metabolic Panel:  Recent Labs  11/24/14 0407 11/25/14 1320 11/26/14 0424  NA 136* 135* 134*  K 4.5 4.2 4.2  CL 101 98 96  CO2 18* 21 22  GLUCOSE 97 97 88  BUN 67* 52* 35*  CREATININE 9.76* 8.08* 5.97*  CALCIUM 7.6* 7.5* 7.9*  PHOS 4.8* 4.8*  --    CBC:  Recent Labs  11/25/14 1320 11/26/14 0424  WBC 8.8 7.7  HGB 7.8* 7.7*  HCT 23.8* 23.8*  MCV 82.4 83.5  PLT 232 248   Cardiac Enzymes:  Recent Labs  11/23/14 1355  TROPONINI <0.30     Assessment/Plan:  1 cardiomyopathy-patient is much improved following initiation of dialysis and improved blood pressure control. Etiology of cardiomyopathy unclear but may be related to coronary disease. He has been scheduled for a right and left cardiac catheterization today. The risks and benefits were discussed and the patient agrees to proceed. Hypertension may have also contributed. Continue low-dose ACE inhibitor and carvedilol. Continue hydralazine but increase to 50  mg by mouth 3 times a day. Continue nitrates. Further titration of medications as tolerated by blood pressure and pulse. We will be careful not to advance medications too quickly particularly given initiation of dialysis. Continue aspirin and add statin. 2 end-stage renal failure-dialysis has been initiated. Following full titration of medications, cardiac catheterization/revascularization as needed we'll plan repeat echocardiogram in 3 months. Hopefully LV function will have improved with above measures. Given end-stage renal disease not clear he would be a candidate for ICD. 3 hypertension-blood pressure remains mildly increased. Increase hydralazine to 50 mg by mouth 3 times a day and follow. 4 anemia-patient was severely anemic at time of presentation. Improved with transfusion. He is heme positive and EGD/colonoscopy is to be performed following cardiac workup. There also is clearly a contribution from end-stage renal disease and decreased erythropoietin. 5 diabetes mellitus-management per primary care. 6 SVT-continue beta blocker.  [name] 11/26/2014, 8:23 AM

## 2014-11-26 NOTE — Progress Notes (Signed)
Daily Rounding Note  11/26/2014, 10:21 AM  LOS: 4 days   SUBJECTIVE:       Cardiac cath this AM.  See below for results, no CAD. Set for AV fistula surgery tomorrow.  Brown stool yesterday.    OBJECTIVE:         Vital signs in last 24 hours:    Temp:  [98.8 F (37.1 C)-99.1 F (37.3 C)] 98.8 F (37.1 C) (12/01 0500) Pulse Rate:  [6-81] 73 (12/01 0500) Resp:  [18-20] 18 (12/01 0500) BP: (134-174)/(61-88) 142/71 mmHg (12/01 0500) SpO2:  [95 %-100 %] 95 % (12/01 0500) Weight:  [190 lb 14.7 oz (86.6 kg)-193 lb 2 oz (87.6 kg)] 190 lb 14.7 oz (86.6 kg) (11/30 1614) Last BM Date: 11/25/14 Filed Weights   11/24/14 2056 11/25/14 1300 11/25/14 1614  Weight: 187 lb (84.823 kg) 193 lb 2 oz (87.6 kg) 190 lb 14.7 oz (86.6 kg)   General: pleasant, comfortable.  Seen in post cath recovery area.    Heart: RRR Chest: clear bil in front Abdomen: soft, NT, ND.  Active BS Extremities: no CCE Neuro/Psych:  Relaxed, oriented x 3.    Intake/Output from previous day: 11/30 0701 - 12/01 0700 In: 480 [P.O.:480] Out: 1000   Intake/Output this shift:    Lab Results:  Recent Labs  11/24/14 0407 11/25/14 1320 11/26/14 0424  WBC 8.4 8.8 7.7  HGB 7.5* 7.8* 7.7*  HCT 22.7* 23.8* 23.8*  PLT 286 232 248   BMET  Recent Labs  11/24/14 0407 11/25/14 1320 11/26/14 0424  NA 136* 135* 134*  K 4.5 4.2 4.2  CL 101 98 96  CO2 18* 21 22  GLUCOSE 97 97 88  BUN 67* 52* 35*  CREATININE 9.76* 8.08* 5.97*  CALCIUM 7.6* 7.5* 7.9*   LFT  Recent Labs  11/23/14 1355 11/24/14 0407 11/25/14 1320  ALBUMIN 3.0* 2.7* 2.7*   PT/INR  Recent Labs  11/26/14 0810  LABPROT 17.0*  INR 1.37   Hepatitis Panel  Recent Labs  11/23/14 1634  HEPBSAG NEGATIVE     ASSESMENT:   * Anemia. Acute on chronic. Microcytic. FOBT + but no melena or BPR. S/p PRBC x 4. Got Ferric gluconatex 3, doses plannned daily through 12/3. Aranesp also  initiated.  Low iron, low iron sat, ferritin 15.   * Chest pain.  Cardiac Troponins normal.  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 BNP 24,651. Cardiomyopathy, new diagnoses.   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   * DM. Non-compliant with meds, no MD visit > 3 years.   * Acute renal failure, hx diabetic nephropathy and CKD. Now with ESRD and initiating on HD: 11/28, 11/29, 11/30.  Renal function improved. .  Likely renal disease contributing to anemia.     PLAN   *  EGD and colonoscopy set for ~3 PM on 12/3.  Risks, benefits d/w pt and his wife (she has had previous colonoscopy herself) *  Fistula placement planned for 12/2.    [name]  11/26/2014, 10:21 AM Pager: [phone]     Attending physician's note   I have taken an interval history, reviewed  the chart and examined the patient. I agree with the Advanced Practitioner's note, impression and recommendations. Multifactorial anemia with component of Fe deficiency and heme + stool. Plan for colonoscopy/EGD on Thursday as AV fistula surgery has been planned for tomorrow.   Pricilla Riffle. Fuller Plan, MD [facility]

## 2014-11-26 NOTE — Progress Notes (Signed)
PROGRESS NOTE  [name] [id number] DOB: 1951/04/28 DOA: 11/22/2014 PCP: [name], MD   Brief history 63 year old male with history of diabetes mellitus, CKD, iron deficiency anemia, hypertension presented with 1 month history of chest discomfort and shortness of breath. The patient has not seen a physician for nearly 2 years. Upon presentation, he was found to have a serum creatinine of 13.72 and hemoglobin of 5.3. The patient stated that he has only been taking his 70/30 insulin on a when necessary basis when his CBGs are greater than 150. He states that he is only been checking his CBGs once daily. The patient stated that he has been having increasing dyspnea on exertion with intermittent chest discomfort. He denies any NSAID use. He denies any hematemesis, hematochezia, melena, hematuria. The patient states that he has not taken any antihypertensive medications for at least 6 months. Workup revealed a positive FOBT. Gastroenterology was consulted and continues to follow the patient. Echocardiogram was performed and revealed EF 20-25% with severe pulmonary artery hypertension. As a result, cardiology was consulted and feels that the patient needs a right and left heart catheterization--scheduled 11/26/14. nephrology was consulted, and the temporary dialysis catheter was placed and the patient was initiated on dialysis on 11/23/2014.Gastroenterology plans upper and lower endoscopy. Assessment/Plan: Acute on chronic renal failure/New ESRD -Appreciate nephrology -TDC placed and HD started on 11/23/14-->had 3 HD -venous mapping 11/24/14--> superficial thrombus in the left cephalic vein -renal ultrasound--no hydronephrosis, R-interpole calculus -awaiting CLIP -L-arm AVF planned 11/27/14--Dr. Bridgett Larsson Symptomatic anemia/iron deficiency anemia -The patient has never had endoscopy/colonoscopy -He has not taken iron for at least a year--history of iron deficiency anemia -I  have performed a rectal exam--brown stool noted, Hemoccult-positive -plans noted for upper and lower endoscopy on 11/27/14? - 4 units PRBC on 11/23/14-->Hgb stable since then -Iron saturation 6%, ferritin 45--will need continued iron supplementation--continue IV iron per nephrology Cardiomyopathy/Dyspnea on exertion  -Certainly, this is partly from from the patient's symptomatic anemia  -Echocardiogram--EF 20-25% with severe PAP (39mmHg), diffuse HK -presently stable on RA -appreciated cardiology followup -Plans noted for heart catheterization-right and left 11/26/2014 SVT/Atrial Tachycardia -intermitten episodes -titrating up carvedilol Diabetes mellitus type 2 -previously taking 70/30, but only on prn basis; noncompliant for some time -Hemoglobin A1c--5.7 -NovoLog sliding scale Hypertension -poorly controlled -Echocardiogram--EF 20-25% with severe PAP (10mmHg), diffuse HK -started carvedilol and lisinoprol--titrating for BP and SVT -started hydralazine, imdur 11/24/14  Family Communication: Wife updated at beside Disposition Plan: Home when medically stable   Procedures/Studies: Dg Chest 2 View (if Patient Has Fever And/or Copd)  11/22/2014   CLINICAL DATA:  Sternal chest pain and dizziness for 2 days, shortness of breath for a month, sore throat since last week, chills since this summer, personal history of hypertension, diabetes, former smoker  EXAM: CHEST  2 VIEW  COMPARISON:  01/28/2012  FINDINGS: Enlargement of cardiac silhouette with borderline vascular congestion.  Mediastinal contours normal.  Lungs clear.  No infiltrate, pleural effusion or pneumothorax.  Mild biconvex thoracolumbar scoliosis without acute osseous findings.  IMPRESSION: Enlargement of cardiac silhouette.  No acute abnormalities.   Electronically Signed   By: Lavonia Dana M.D.   On: 11/22/2014 16:09   Ct Head Wo Contrast  11/23/2014   CLINICAL DATA:  63 year old male with intermittent visual changes   EXAM: CT HEAD WITHOUT CONTRAST  TECHNIQUE: Contiguous axial images were obtained from the base of the skull through the vertex without intravenous contrast.  COMPARISON:  None  FINDINGS: Negative for acute intracranial hemorrhage, acute infarction, mass, mass effect, hydrocephalus or midline shift. Gray-white differentiation is preserved throughout. Very mild atrophy. Mild periventricular and subcortical white matter hypoattenuation most consistent with the sequelae of longstanding microvascular ischemic white matter disease. No focal soft tissue or calvarial abnormality. No focal global abnormality. The orbits are symmetric. Normal aeration of the mastoid air cells and visualized paranasal sinuses. Trace atherosclerotic calcification in the bilateral cavernous carotid arteries.  IMPRESSION: 1. No acute intracranial abnormality. 2. 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 3. Intracranial atherosclerosis.   Electronically Signed   By: Jacqulynn Cadet M.D.   On: 11/23/2014 03:08   US Renal  11/23/2014   CLINICAL DATA:  Renal failure  EXAM: RENAL/URINARY TRACT ULTRASOUND COMPLETE  COMPARISON:  None.  FINDINGS: Right Kidney:  Length: 10.0 cm.  5 mm interpolar calculus.  No hydronephrosis.  Left Kidney:  Length: 9.7 cm.  No mass or hydronephrosis.  Bladder:  Within normal limits.  IMPRESSION: 5 mm interpolar right renal calculus.  No hydronephrosis.   Electronically Signed   By: Julian Hy M.D.   On: 11/23/2014 12:40   Ir Fluoro Guide Cv Line Right  11/23/2014   CLINICAL DATA:  Progressive renal insufficiency, needs long-term access for hemodialysis  EXAM: TUNNELED HEMODIALYSIS CATHETER PLACEMENT WITH ULTRASOUND AND FLUOROSCOPIC GUIDANCE  TECHNIQUE: The procedure, risks, benefits, and alternatives were explained to the patient. Questions regarding the procedure were encouraged and answered. The patient understands and consents to the procedure. As antibiotic prophylaxis,  vancomycin 1 g was ordered pre-procedure and administered intravenously within one hour of incision.Patency of the right IJ vein was confirmed with ultrasound with image documentation. An appropriate skin site was determined. Region was prepped using maximum barrier technique including cap and mask, sterile gown, sterile gloves, large sterile sheet, and Chlorhexidine as cutaneous antisepsis. The region was infiltrated locally with 1% lidocaine.  Intravenous Fentanyl and Versed were administered as conscious sedation during continuous cardiorespiratory monitoring by the radiology RN, with a total moderate sedation time of 11 minutes.  Under real-time ultrasound guidance, the right IJ vein was accessed with a 21 gauge micropuncture needle; the needle tip within the vein was confirmed with ultrasound image documentation. Needle exchanged over the 018 guidewire for transitional dilator, which allowed advancement of a Benson wire into the IVC. Over this, an MPA catheter was advanced. A Hemosplit 19 hemodialysis catheter was tunneled from the right anterior chest wall approach to the right IJ dermatotomy site. The MPA catheter was exchanged over an Amplatz wire for serial vascular dilators which allow placement of a peel-away sheath, through which the catheter was advanced under intermittent fluoroscopy, positioned with its tips in the proximal and midright atrium. Spot chest radiograph confirms good catheter position. No pneumothorax. Catheter was flushed and primed per protocol. Catheter secured externally with O Prolene sutures. The right IJ dermatotomy site was closed with Dermabond.  COMPLICATIONS: COMPLICATIONS None immediate  FLUOROSCOPY TIME:  12 seconds  COMPARISON:  None  IMPRESSION: 1. Technically successful placement of tunneled right IJ hemodialysis catheter with ultrasound and fluoroscopic guidance. Ready for routine use.  ACCESS: Remains approachable for percutaneous intervention as needed.   Electronically  Signed   By: Arne Cleveland M.D.   On: 11/23/2014 14:25   Ir US Guide Vasc Access Right  11/23/2014   CLINICAL DATA:  Progressive renal insufficiency, needs long-term access for hemodialysis  EXAM: TUNNELED HEMODIALYSIS CATHETER PLACEMENT WITH ULTRASOUND AND FLUOROSCOPIC GUIDANCE  TECHNIQUE: The procedure, risks, benefits, and alternatives were explained to the patient. Questions regarding the procedure were encouraged and answered. The patient understands and consents to the procedure. As antibiotic prophylaxis, vancomycin 1 g was ordered pre-procedure and administered intravenously within one hour of incision.Patency of the right IJ vein was confirmed with ultrasound with image documentation. An appropriate skin site was determined. Region was prepped using maximum barrier technique including cap and mask, sterile gown, sterile gloves, large sterile sheet, and Chlorhexidine as cutaneous antisepsis. The region was infiltrated locally with 1% lidocaine.  Intravenous Fentanyl and Versed were administered as conscious sedation during continuous cardiorespiratory monitoring by the radiology RN, with a total moderate sedation time of 11 minutes.  Under real-time ultrasound guidance, the right IJ vein was accessed with a 21 gauge micropuncture needle; the needle tip within the vein was confirmed with ultrasound image documentation. Needle exchanged over the 018 guidewire for transitional dilator, which allowed advancement of a Benson wire into the IVC. Over this, an MPA catheter was advanced. A Hemosplit 19 hemodialysis catheter was tunneled from the right anterior chest wall approach to the right IJ dermatotomy site. The MPA catheter was exchanged over an Amplatz wire for serial vascular dilators which allow placement of a peel-away sheath, through which the catheter was advanced under intermittent fluoroscopy, positioned with its tips in the proximal and midright atrium. Spot chest radiograph confirms good  catheter position. No pneumothorax. Catheter was flushed and primed per protocol. Catheter secured externally with O Prolene sutures. The right IJ dermatotomy site was closed with Dermabond.  COMPLICATIONS: COMPLICATIONS None immediate  FLUOROSCOPY TIME:  12 seconds  COMPARISON:  None  IMPRESSION: 1. Technically successful placement of tunneled right IJ hemodialysis catheter with ultrasound and fluoroscopic guidance. Ready for routine use.  ACCESS: Remains approachable for percutaneous intervention as needed.   Electronically Signed   By: Arne Cleveland M.D.   On: 11/23/2014 14:25         Subjective:Patient denies fevers, chills, headache, chest pain, dyspnea, nausea, vomiting, diarrhea, abdominal pain, dysuria, hematuria   Objective: Filed Vitals:   11/25/14 1614 11/25/14 1800 11/25/14 2100 11/26/14 0500  BP: 174/85 158/71 134/61 142/71  Pulse: 77 81 75 73  Temp: 98.8 F (37.1 C) 98.9 F (37.2 C) 99.1 F (37.3 C) 98.8 F (37.1 C)  TempSrc: Oral Oral Oral Oral  Resp: 20 18 18 18   Height:      Weight: 86.6 kg (190 lb 14.7 oz)     SpO2: 100% 99% 96% 95%    Intake/Output Summary (Last 24 hours) at 11/26/14 0725 Last data filed at 11/25/14 1614  Gross per 24 hour  Intake    480 ml  Output   1000 ml  Net   -520 ml   Weight change: 0.2 kg (7.1 oz) Exam:   General:  Pt is alert, follows commands appropriately, not in acute distress  HEENT: No icterus, No thrush,  [place]/AT  Cardiovascular: RRR, S1/S2, no rubs, no gallops  Respiratory: CTA bilaterally, no wheezing, no crackles, no rhonchi  Abdomen: Soft/+BS, non tender, non distended, no guarding  Extremities: No edema, No lymphangitis, No petechiae, No rashes, no synovitis  Data Reviewed: Basic Metabolic Panel:  Recent Labs Lab 11/22/14 2205 11/23/14 1355 11/24/14 0407 11/25/14 1320 11/26/14 0424  NA 134* 137 136* 135* 134*  K 5.3 4.9 4.5 4.2 4.2  CL 101 103 101 98 96  CO2 12* 13* 18* 21 22  GLUCOSE 103* 111* 97  97 88  BUN 108* 101* 67* 52* 35*  CREATININE 13.72* 13.04* 9.76* 8.08* 5.97*  CALCIUM 8.1* 8.0* 7.6* 7.5* 7.9*  PHOS  --  7.6* 4.8* 4.8*  --    Liver Function Tests:  Recent Labs Lab 11/23/14 1355 11/24/14 0407 11/25/14 1320  ALBUMIN 3.0* 2.7* 2.7*   No results for input(s): LIPASE, AMYLASE in the last 168 hours. No results for input(s): AMMONIA in the last 168 hours. CBC:  Recent Labs Lab 11/22/14 1527 11/23/14 1634 11/23/14 2120 11/24/14 0407 11/25/14 1320 11/26/14 0424  WBC 9.1  --  9.0 8.4 8.8 7.7  HGB 5.3* 6.2* 7.8* 7.5* 7.8* 7.7*  HCT 16.4* 19.2* 23.2* 22.7* 23.8* 23.8*  MCV 79.6  --  78.9 79.1 82.4 83.5  PLT 373  --  290 286 232 248   Cardiac Enzymes:  Recent Labs Lab 11/22/14 1704 11/22/14 2205 11/23/14 1355  TROPONINI <0.30 <0.30 <0.30   BNP: Invalid input(s): POCBNP CBG:  Recent Labs Lab 11/24/14 2129 11/25/14 0800 11/25/14 1125 11/25/14 1645 11/25/14 2230  GLUCAP 124* 110* 135* 86 129*    Recent Results (from the past 240 hour(s))  Urine culture     Status: None   Collection Time: 11/22/14  5:40 PM  Result Value Ref Range Status   Specimen Description URINE, RANDOM  Final   Special Requests NONE  Final   Culture  Setup Time   Final    11/22/2014 21:38 Performed at [facility] Performed at [facility]   Final   Culture NO GROWTH Performed at [facility]   Final   Report Status 11/23/2014 FINAL  Final     Scheduled Meds: . sodium chloride  10 mL/hr Intravenous Once  . sodium chloride   Intravenous Once  . sodium chloride   Intravenous Once  . bisacodyl  5 mg Oral Once  . carvedilol  6.25 mg Oral BID WC  . darbepoetin (ARANESP) injection - DIALYSIS  25 mcg Intravenous Q Mon-HD  . docusate sodium  100 mg Oral BID  . feeding supplement (NEPRO CARB STEADY)  237 mL Oral BID BM  . ferric gluconate (FERRLECIT/NULECIT) IV  125 mg Intravenous Daily  . hydrALAZINE  25 mg Oral 3  times per day  . insulin aspart  0-5 Units Subcutaneous QHS  . insulin aspart  0-9 Units Subcutaneous TID WC  . isosorbide mononitrate  30 mg Oral Daily  . lisinopril  5 mg Oral Daily  . multivitamin  1 tablet Oral QHS  . pantoprazole  40 mg Oral Q1200  . sodium chloride  3 mL Intravenous Q12H  . sodium chloride  3 mL Intravenous Q12H  . sodium chloride  3 mL Intravenous Q12H  . [START ON 11/27/2014] vancomycin  1,000 mg Intravenous To OR   Continuous Infusions:    Rosary Filosa, DO  [facility] Pager [phone]  If 7PM-7AM, please contact night-coverage [email or web address] Password [email or web address] 11/26/2014, 7:25 AM   LOS: 4 days

## 2014-11-26 NOTE — Plan of Care (Signed)
Problem: Phase II Progression Outcomes Goal: Other Discharge Outcomes/Goals Outcome: Not Applicable Date Met:  45/91/36

## 2014-11-26 NOTE — CV Procedure (Signed)
RIGHT & LEFT HEART CATHETERIZATION REPORT  NAME:  [name]   MRN: [id number] DOB:  Dec 02, 1951   ADMIT DATE: 11/22/2014 Procedure Date: 11/26/2014  INTERVENTIONAL CARDIOLOGIST: Leonie Man, M.D., MS PRIMARY CARE PROVIDER: [name], MD PRIMARY CARDIOLOGIST: New patient to [name]   PATIENT:  [name] is a 63 year old gentleman with hypertension that is poorly controlled as well as diabetes who now is new start hemodialysis. He presented with dyspnea and chest discomfort found to be probably hypertensive with worsening renal insufficiency. Tachycardia graphic evaluation demonstrated an EF of 20-25%. He is now referred for evaluation with right nipple catheterization.   PRE-OPERATIVE DIAGNOSIS:    New diagnosis of Severe Cardiomyopathy  PROCEDURES PERFORMED:    Right & Left Heart Catheterization with Native Coronary Angiography  via Right Common Femoral Artery & Vein Access.  Left Ventriculography  PROCEDURE: The patient was brought to the [facility]nd [facility] in the fasting state and prepped and draped in the usual sterile fashion for Right groin access. Sterile technique was used including antiseptics, cap, gloves, gown, hand hygiene, mask and sheet. Skin prep: Chlorhexidine.   Consent: Risks of procedure as well as the alternatives and risks of each were explained to the (patient/caregiver). Consent for procedure obtained.   Time Out: Verified patient identification, verified procedure, site/side was marked, verified correct patient position, special equipment/implants available, medications/allergies/relevent history reviewed, required imaging and test results available. Performed.  Access:  Right Common Femoral Artery: 5 Fr Sheath - fluoroscopically guided modified Seldinger Technique  Right Common Femoral Vein: 7 Fr Sheath - Seldinger Technique.  Right Heart Catheterization: 7Fr Gordy Councilman catheter advanced under  fluoroscopy with balloon inflated to the RA, RV, then PCWP-PA for hemodynamic measurement.  Simultaneous FA & PA blood gases checked for SaO2% to calculate FICK CO/CI  Thermodilution Injections performed to calculate CO/CI  Simultaneous PCWP/LV & RV/LV pressures monitored with Angled Pigtail in LV.  Catheter removed completely out of the body with balloon deflated.  Left Heart Catheterization: 5 Fr Catheters advanced or exchanged over a J-wire; angled pigtail catheter advanced first.  LV Hemodynamics (LV Gram): Angled pigtail Left Coronary Artery Cineangiography: JL4 Catheter  Right Coronary Artery Cineangiography: JR 4 Catheter   Sheath removed in the holding area with manual pressure for hemostasis.   MEDICATIONS:   ANESTHESIA: Local Lidocaine 18 ml   SEDATION: 1 mg IV Versed, 50 mcg IV fentanyl   Omnipaque contrast 90  mL   FINDINGS:  Hemodynamics:  Findings:   SaO2%  Pressures mmHg  Mean P  mmHg  EDP  mmHg   Right Atrium   post NTG   12/11    10   Right Ventricle    73/4    14   Pulmonary Artery   72   71/26   43    PCWP   pre-NTG Post-NTG  40/48  26/29   38 22    Central Aortic   96   161/79 Post-NTG   110    Left Ventricle   pre-NTG Post-NTG  172/10  160/11    26  25    Mitral gradient    17.9/14.7                  Cardiac Output:   Cardiac Index:    Fick   11.17    5.29    Thermodilution   8.53    4.04     Coronary Anatomy: Right dominant  Left Main: Large-caliber vessel that bifurcates into the LAD and Circumflex. Angiographically normal. LAD & D1: Large-caliber wraparound vessel that bifurcates at the apex. It gives rise to a large caliber major first diagonal branch and large first septal perforator. Angiographically free of disease.  Left Circumflex: Large-caliber vessel that gives rise to a proximal large lateral OM 1 before going into the AV groove where it gives off a large left posterior lateral (LPL 1 with 2 smaller posterior lateral branches on  either side). Angiographically normal.   RCA: Large-caliber, dominant vessel that has several RV marginal branches. Bifurcates distally into a moderate to large caliber PDA because two thirds the way to the apex. There is also smaller posterior lateral branch that gives off the AV nodal artery. Angiographically normal.  PATIENT DISPOSITION:    The patient was transferred to the PACU holding area in a hemodynamicaly stable, chest pain free condition.  The patient tolerated the procedure well, and there were no complications.  The patient was stable before, during, and after the procedure.  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 Will need when necessary medications for severe hypertension.  Continue aggressive management of likely hypertensive cardiomyopathy. I've increased carvedilol dose to 12.5 mm is twice a day  Would anticipate possible partial recovery of function as the LV gram EF is notably improved over the echocardiographic EF.     Leonie Man, M.D., M.S. Interventional Cardiologist   Pager # [phone]  01/17/2013 4:44 PM

## 2014-11-26 NOTE — Interval H&P Note (Signed)
History and Physical Interval Note:  11/26/2014 9:14 AM  [name]  has presented today for surgery, with the diagnosis of NEW ONSET CHF - EF 20-25% with chest pressure.   The various methods of treatment have been discussed with the patient and family. After consideration of risks, benefits and other options for treatment, the patient has consented to  Procedure(s): LEFT AND RIGHT HEART CATHETERIZATION WITH CORONARY ANGIOGRAM (N/A) +/- PCI as a surgical intervention .  The patient's history has been reviewed, patient examined, no change in status, stable for surgery.  I have reviewed the patient's chart and labs.  Questions were answered to the patient's satisfaction.    Cath Lab Visit (complete for each Cath Lab visit)  Clinical Evaluation Leading to the Procedure:   ACS: No.  Non-ACS:    Anginal Classification: CCS III  Anti-ischemic medical therapy: Maximal Therapy (2 or more classes of medications)  Non-Invasive Test Results: High-risk stress test findings: cardiac mortality >3%/year - Echo EF 20-25%  Prior CABG: No previous CABG   Cai Flott W  AUC for R&LHC: Cardiomyopathies (Right and Left Heart Catheterization OR  Right Heart Catheterization Alone With/Without Left Ventriculography and Coronary Angiography)   Patient Information:    Known or suspected cardiomyopathy with or without heart failure  AUC Score:   A (7)   Indication:   93  For PCI: Ischemic Symptoms? CCS III (Marked limitation of ordinary activity) Anti-ischemic Medical Therapy? Maximal Medical Therapy (2 or more classes of medications) Non-invasive Test Results? High-risk stress test findings: cardiac mortality >3%/yr Prior CABG? No Previous CABG   Patient Information:   1-2V CAD, no prox LAD  A (9)  Indication: 19; Score: 9   Patient Information:   CTO of 1 vessel, no other CAD  A (8)  Indication: 29; Score: 8   Patient Information:   1V CAD with prox LAD  A (9)   Indication: 35; Score: 9   Patient Information:   2V-CAD with prox LAD  A (9)  Indication: 41; Score: 9   Patient Information:   3V-CAD without LMCA  A (9)  Indication: 47; Score: 9   Patient Information:   3V-CAD without LMCA With Abnormal LV systolic function  A (9)  Indication: 48; Score: 9   Patient Information:   LMCA-CAD  A (9)  Indication: 49; Score: 9   Patient Information:   2V-CAD with prox LAD PCI  A (7)  Indication: 62; Score: 7   Patient Information:   2V-CAD with prox LAD CABG  A (8)  Indication: 62; Score: 8   Patient Information:   3V-CAD without LMCA With Low CAD burden(i.e., 3 focal stenoses, low SYNTAX score) PCI  A (7)  Indication: 63; Score: 7   Patient Information:   3V-CAD without LMCA With Low CAD burden(i.e., 3 focal stenoses, low SYNTAX score) CABG  A (9)  Indication: 63; Score: 9   Patient Information:   3V-CAD without LMCA E06c - Intermediate-high CAD burden (i.e., multiple diffuse lesions, presence of CTO, or high SYNTAX score) PCI  U (4)  Indication: 64; Score: 4   Patient Information:   3V-CAD without LMCA E06c - Intermediate-high CAD burden (i.e., multiple diffuse lesions, presence of CTO, or high SYNTAX score) CABG  A (9)  Indication: 64; Score: 9   Patient Information:   LMCA-CAD With Isolated LMCA stenosis  PCI  U (6)  Indication: 65; Score: 6   Patient Information:   LMCA-CAD With Isolated LMCA stenosis  CABG  A (9)  Indication: 65; Score: 9   Patient Information:   LMCA-CAD Additional CAD, low CAD burden (i.e., 1- to 2-vessel additional involvement, low SYNTAX score) PCI  U (5)  Indication: 66; Score: 5   Patient Information:   LMCA-CAD Additional CAD, low CAD burden (i.e., 1- to 2-vessel additional involvement, low SYNTAX score) CABG  A (9)  Indication: 66; Score: 9   Patient Information:   LMCA-CAD Additional CAD, intermediate-high CAD burden (i.e.,  3-vessel involvement, presence of CTO, or high SYNTAX score) PCI  I (3)  Indication: 67; Score: 3   Patient Information:   LMCA-CAD Additional CAD, intermediate-high CAD burden (i.e., 3-vessel involvement, presence of CTO, or high SYNTAX score) CABG  A (9)  Indication: 67; Score: 9

## 2014-11-26 NOTE — Progress Notes (Signed)
Subjective:  No cos, eating sp earlier card cath . Tolerated  Hd yesterday #3   Objective Vital signs in last 24 hours: Filed Vitals:   11/26/14 1325 11/26/14 1340 11/26/14 1355 11/26/14 1430  BP: 145/56 139/57 149/56 141/56  Pulse: 77 78 77 76  Temp:    99.6 F (37.6 C)  TempSrc:    Oral  Resp: '19 23 21 22  ' Height:      Weight:      SpO2: 98% 98% 100% 95%   Weight change: 0.2 kg (7.1 oz) Physical Exam: General: alert NAD , thin BM , appropriate OX 4 Heart: RRR 1/6 sem lsb, No gallop , or rub Lungs: CTA Bilat . nonlabored Abdomen: Soft NT. ND Extremities: no pedal edema Dialysis Access: R ij Perm cath   Problem/Plan: 1. 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 S/P [facility] 11/28 /15 by IR.AVF  Placement in am  By [facility]/ Clip process started for [facility] kid [facility], Financial counselor will need to see (uninsured). For fourth  HD tomorrow . 2. Anemia - Hgb 5.3 admit  up to 7.7 / + Fe deficiency. s/p 3 PRBC Replete iron. Started Aranesp 25. Hemoccult stools Pos. Elba Barman Per admit with Gi  Seeing planning colonoscopy/ egd Dr. Fuller Plan 3. MBD - PTH pending and phosphorus 4.8 Ca 9.0 corrected/ no binder 4. HTN -  bp 141/ 56 on Meds/volume control with HD  - gentle lowering of vol. With new hd/ hydralazine 8m tid / lisinopril  539m q day change  to hs/ coreg 12.5 mg bid ,/ Has CM With Echo = LVEF 20-25 % and Card EP= Dr. KlCaryl Comesval in process 5. Card.- sp card cath today= normal coronary arteries,Hypertensive / Non-ischemic Cardiomyopathy with improved LVEF by LV Gram andSevere Secondary Pulmonary Hypertension with suggestion of possible mitral valve disease 6.  DM - no meds / Diet control now / A1C 5.7.  7.  Dyspnea - multifactorial= likely related to his severe anemia and volume issues and CM/ resolved this am 99- 96 % O2 sats 8.  H/o toe amputation in the past 9.   Nutrition - alb 2.7 anorexia improving  after HD txs - needs education  = Wife Given NKF website  information and asked nutrition to see -  DaErnest HaberPA-C [phone]/12/2013,3:37 PM  LOS: 4 days   Pt seen, examined and agree w A/P as above.  RoKelly SplinterD pager [phone]  cell [phone]/01/2014, 8:11 AM    Labs: Basic Metabolic Panel:  Recent Labs Lab 11/23/14 1355 11/24/14 0407 11/25/14 1320 11/26/14 0424  NA 137 136* 135* 134*  K 4.9 4.5 4.2 4.2  CL 103 101 98 96  CO2 13* 18* 21 22  GLUCOSE 111* 97 97 88  BUN 101* 67* 52* 35*  CREATININE 13.04* 9.76* 8.08* 5.97*  CALCIUM 8.0* 7.6* 7.5* 7.9*  PHOS 7.6* 4.8* 4.8*  --    Liver Function Tests:  Recent Labs Lab 11/23/14 1355 11/24/14 0407 11/25/14 1320  ALBUMIN 3.0* 2.7* 2.7*   No results for input(s): LIPASE, AMYLASE in the last 168 hours. No results for input(s): AMMONIA in the last 168 hours. CBC:  Recent Labs Lab 11/22/14 1527  11/23/14 2120 11/24/14 0407 11/25/14 1320 11/26/14 0424  WBC 9.1  --  9.0 8.4 8.8 7.7  HGB 5.3*  < > 7.8* 7.5* 7.8* 7.7*  HCT 16.4*  < > 23.2* 22.7* 23.8* 23.8*  MCV 79.6  --  78.9 79.1 82.4 83.5  PLT 373  --  290 286 232 248  < > = values in this interval not displayed. Cardiac Enzymes:  Recent Labs Lab 11/22/14 1704 11/22/14 2205 11/23/14 1355  TROPONINI <0.30 <0.30 <0.30   CBG:  Recent Labs Lab 11/25/14 1645 11/25/14 2230 11/26/14 0745 11/26/14 1048 11/26/14 1420  GLUCAP 86 129* 100* 93 127*    Studies/Results: No results found. Medications: . sodium chloride    . sodium chloride     . sodium chloride  10 mL/hr Intravenous Once  . sodium chloride   Intravenous Once  . sodium chloride   Intravenous Once  . [MAR Hold] bisacodyl  5 mg Oral Once  . bisacodyl  5 mg Oral Once  . carvedilol  12.5 mg Oral BID WC  . darbepoetin (ARANESP) injection - DIALYSIS  25 mcg Intravenous Q Mon-HD  . docusate sodium  100 mg Oral BID  . ferric gluconate (FERRLECIT/NULECIT) IV  125 mg Intravenous Daily  . heparin   5,000 Units Subcutaneous 3 times per day  . hydrALAZINE  50 mg Oral 3 times per day  . insulin aspart  0-5 Units Subcutaneous QHS  . insulin aspart  0-9 Units Subcutaneous TID WC  . isosorbide mononitrate  30 mg Oral Daily  . lisinopril  5 mg Oral Daily  . multivitamin  1 tablet Oral QHS  . pantoprazole  40 mg Oral Q1200  . peg 3350 powder  1 kit Oral Once  . sodium chloride  3 mL Intravenous Q12H  . sodium chloride  3 mL Intravenous Q12H  . sodium chloride  3 mL Intravenous Q12H  . [START ON 11/27/2014] vancomycin  1,000 mg Intravenous To OR

## 2014-11-26 NOTE — H&P (View-Only) (Signed)
    Subjective:  Denies CP or dyspnea   Objective:  Filed Vitals:   11/25/14 1614 11/25/14 1800 11/25/14 2100 11/26/14 0500  BP: 174/85 158/71 134/61 142/71  Pulse: 77 81 75 73  Temp: 98.8 F (37.1 C) 98.9 F (37.2 C) 99.1 F (37.3 C) 98.8 F (37.1 C)  TempSrc: Oral Oral Oral Oral  Resp: 20 18 18 18   Height:      Weight: 190 lb 14.7 oz (86.6 kg)     SpO2: 100% 99% 96% 95%    Intake/Output from previous day:  Intake/Output Summary (Last 24 hours) at 11/26/14 [id number] Last data filed at 11/25/14 1614  Gross per 24 hour  Intake    480 ml  Output   1000 ml  Net   -520 ml    Physical Exam: Physical exam: Well-developed well-nourished in no acute distress.  Skin is warm and dry.  HEENT is normal.  Neck is supple.  Chest is clear to auscultation with normal expansion.  Cardiovascular exam is regular rate and rhythm.  Abdominal exam nontender or distended. No masses palpated. Extremities show no edema. neuro grossly intact    Lab Results: Basic Metabolic Panel:  Recent Labs  11/24/14 0407 11/25/14 1320 11/26/14 0424  NA 136* 135* 134*  K 4.5 4.2 4.2  CL 101 98 96  CO2 18* 21 22  GLUCOSE 97 97 88  BUN 67* 52* 35*  CREATININE 9.76* 8.08* 5.97*  CALCIUM 7.6* 7.5* 7.9*  PHOS 4.8* 4.8*  --    CBC:  Recent Labs  11/25/14 1320 11/26/14 0424  WBC 8.8 7.7  HGB 7.8* 7.7*  HCT 23.8* 23.8*  MCV 82.4 83.5  PLT 232 248   Cardiac Enzymes:  Recent Labs  11/23/14 1355  TROPONINI <0.30     Assessment/Plan:  1 cardiomyopathy-patient is much improved following initiation of dialysis and improved blood pressure control. Etiology of cardiomyopathy unclear but may be related to coronary disease. He has been scheduled for a right and left cardiac catheterization today. The risks and benefits were discussed and the patient agrees to proceed. Hypertension may have also contributed. Continue low-dose ACE inhibitor and carvedilol. Continue hydralazine but increase to 50  mg by mouth 3 times a day. Continue nitrates. Further titration of medications as tolerated by blood pressure and pulse. We will be careful not to advance medications too quickly particularly given initiation of dialysis. Continue aspirin and add statin. 2 end-stage renal failure-dialysis has been initiated. Following full titration of medications, cardiac catheterization/revascularization as needed we'll plan repeat echocardiogram in 3 months. Hopefully LV function will have improved with above measures. Given end-stage renal disease not clear he would be a candidate for ICD. 3 hypertension-blood pressure remains mildly increased. Increase hydralazine to 50 mg by mouth 3 times a day and follow. 4 anemia-patient was severely anemic at time of presentation. Improved with transfusion. He is heme positive and EGD/colonoscopy is to be performed following cardiac workup. There also is clearly a contribution from end-stage renal disease and decreased erythropoietin. 5 diabetes mellitus-management per primary care. 6 SVT-continue beta blocker.  Kirk Ruths 11/26/2014, 8:23 AM

## 2014-11-26 NOTE — Progress Notes (Signed)
Pt gone down via bed to cath lab accompanied by wife and staff.

## 2014-11-26 NOTE — Progress Notes (Signed)
Pt NPO for cardiac cath left and right groin.

## 2014-11-26 NOTE — Plan of Care (Signed)
Problem: Phase II Progression Outcomes Goal: Pain controlled with appropriate interventions Outcome: Completed/Met Date Met:  11/26/14 Goal: Hemodynamically stable Outcome: Completed/Met Date Met:  11/26/14 Goal: Complications resolved/controlled Outcome: Completed/Met Date Met:  11/26/14 Goal: Tolerates diet Outcome: Completed/Met Date Met:  11/26/14 Goal: Vascular site scale level 0 - I Vascular Site Scale Level 0: No bruising/bleeding/hematoma Level I (Mild): Bruising/Ecchymosis, minimal bleeding/ooozing, palpable hematoma < 3 cm Level II (Moderate): Bleeding not affecting hemodynamic parameters, pseudoaneurysm, palpable hematoma > 3 cm Level III (Severe) Bleeding which affects hemodynamic parameters or retroperitoneal hemorrhage  Outcome: Completed/Met Date Met:  11/26/14     

## 2014-11-27 ENCOUNTER — Inpatient Hospital Stay (HOSPITAL_COMMUNITY): Payer: Medicaid Other | Admitting: Anesthesiology

## 2014-11-27 ENCOUNTER — Encounter (HOSPITAL_COMMUNITY)
Admission: EM | Disposition: A | Discharge status: Home / Self Care | Payer: Self-pay | Source: Home / Self Care | Attending: Internal Medicine

## 2014-11-27 ENCOUNTER — Encounter (HOSPITAL_COMMUNITY): Payer: Self-pay | Admitting: Anesthesiology

## 2014-11-27 DIAGNOSIS — N185 Chronic kidney disease, stage 5: Secondary | ICD-10-CM

## 2014-11-27 HISTORY — PX: AV FISTULA PLACEMENT: SHX1204

## 2014-11-27 LAB — PROTIME-INR
INR: 1.33 (ref 0.00–1.49)
Prothrombin Time: 16.7 seconds — ABNORMAL HIGH (ref 11.6–15.2)

## 2014-11-27 LAB — SODIUM, URINE, RANDOM: Sodium, Ur: 74 mEq/L

## 2014-11-27 LAB — PARATHYROID HORMONE, INTACT (NO CA): PTH: 677 pg/mL — ABNORMAL HIGH (ref 14–64)

## 2014-11-27 LAB — CBC
HCT: 24.2 % — ABNORMAL LOW (ref 39.0–52.0)
HEMATOCRIT: 25 % — AB (ref 39.0–52.0)
Hemoglobin: 7.9 g/dL — ABNORMAL LOW (ref 13.0–17.0)
Hemoglobin: 7.7 g/dL — ABNORMAL LOW (ref 13.0–17.0)
MCH: 25.9 pg — ABNORMAL LOW (ref 26.0–34.0)
MCH: 26.4 pg (ref 26.0–34.0)
MCHC: 31.6 g/dL (ref 30.0–36.0)
MCHC: 31.8 g/dL (ref 30.0–36.0)
MCV: 82 fL (ref 78.0–100.0)
MCV: 82.9 fL (ref 78.0–100.0)
PLATELETS: 256 10*3/uL (ref 150–400)
Platelets: 274 10*3/uL (ref 150–400)
RBC: 3.05 MIL/uL — ABNORMAL LOW (ref 4.22–5.81)
RBC: 2.92 MIL/uL — ABNORMAL LOW (ref 4.22–5.81)
RDW: 17 % — AB (ref 11.5–15.5)
RDW: 16.9 % — ABNORMAL HIGH (ref 11.5–15.5)
WBC: 10.1 10*3/uL (ref 4.0–10.5)
WBC: 9.7 10*3/uL (ref 4.0–10.5)

## 2014-11-27 LAB — RENAL FUNCTION PANEL
Albumin: 2.6 g/dL — ABNORMAL LOW (ref 3.5–5.2)
Anion gap: 17 — ABNORMAL HIGH (ref 5–15)
BUN: 44 mg/dL — ABNORMAL HIGH (ref 6–23)
CHLORIDE: 98 meq/L (ref 96–112)
CO2: 21 mEq/L (ref 19–32)
Calcium: 8.1 mg/dL — ABNORMAL LOW (ref 8.4–10.5)
Creatinine, Ser: 7.87 mg/dL — ABNORMAL HIGH (ref 0.50–1.35)
GFR calc Af Amer: 7 mL/min — ABNORMAL LOW (ref >90)
GFR, EST NON AFRICAN AMERICAN: 6 mL/min — AB (ref >90)
Glucose, Bld: 143 mg/dL — ABNORMAL HIGH (ref 70–99)
PHOSPHORUS: 6.3 mg/dL — AB (ref 2.3–4.6)
POTASSIUM: 4.3 meq/L (ref 3.7–5.3)
Sodium: 136 mEq/L — ABNORMAL LOW (ref 137–147)

## 2014-11-27 LAB — GLUCOSE, CAPILLARY
GLUCOSE-CAPILLARY: 109 mg/dL — AB (ref 70–99)
GLUCOSE-CAPILLARY: 113 mg/dL — AB (ref 70–99)
GLUCOSE-CAPILLARY: 117 mg/dL — AB (ref 70–99)
Glucose-Capillary: 119 mg/dL — ABNORMAL HIGH (ref 70–99)
Glucose-Capillary: 229 mg/dL — ABNORMAL HIGH (ref 70–99)

## 2014-11-27 SURGERY — ARTERIOVENOUS (AV) FISTULA CREATION
Anesthesia: Monitor Anesthesia Care | Site: Arm Upper | Laterality: Left

## 2014-11-27 MED ORDER — HEPARIN SODIUM (PORCINE) 5000 UNIT/ML IJ SOLN: 5000.0000 [IU] | Freq: Three times a day (TID) | INTRAMUSCULAR | Status: DC

## 2014-11-27 MED ORDER — PROPOFOL INFUSION 10 MG/ML OPTIME
INTRAVENOUS | Status: DC | PRN
  Administered 2014-11-27: 50 ug/kg/min via INTRAVENOUS

## 2014-11-27 MED ORDER — FENTANYL CITRATE 0.05 MG/ML IJ SOLN
INTRAMUSCULAR | Status: AC
  Filled 2014-11-27: qty 5

## 2014-11-27 MED ORDER — EPHEDRINE SULFATE 50 MG/ML IJ SOLN
INTRAMUSCULAR | Status: AC
  Filled 2014-11-27: qty 1

## 2014-11-27 MED ORDER — DEXTROSE 5 % IV SOLN
INTRAVENOUS | Status: DC | PRN
  Administered 2014-11-27: 08:00:00 via INTRAVENOUS

## 2014-11-27 MED ORDER — NEPRO/CARBSTEADY PO LIQD: 237.0000 mL | ORAL | Status: DC | PRN

## 2014-11-27 MED ORDER — BUPIVACAINE HCL (PF) 0.5 % IJ SOLN
INTRAMUSCULAR | Status: DC | PRN
  Administered 2014-11-27: 30 mL

## 2014-11-27 MED ORDER — DARBEPOETIN ALFA 25 MCG/0.42ML IJ SOSY
PREFILLED_SYRINGE | INTRAMUSCULAR | Status: AC
  Administered 2014-11-27: 25 ug via INTRAVENOUS
  Filled 2014-11-27: qty 0.42

## 2014-11-27 MED ORDER — LIDOCAINE-PRILOCAINE 2.5-2.5 % EX CREA: 1.0000 "application " | TOPICAL_CREAM | CUTANEOUS | Status: DC | PRN

## 2014-11-27 MED ORDER — LIDOCAINE-EPINEPHRINE (PF) 1 %-1:200000 IJ SOLN
INTRAMUSCULAR | Status: DC | PRN
  Administered 2014-11-27: 30 mL

## 2014-11-27 MED ORDER — PHENYLEPHRINE 40 MCG/ML (10ML) SYRINGE FOR IV PUSH (FOR BLOOD PRESSURE SUPPORT)
PREFILLED_SYRINGE | INTRAVENOUS | Status: AC
  Filled 2014-11-27: qty 10

## 2014-11-27 MED ORDER — ONDANSETRON HCL 4 MG/2ML IJ SOLN
INTRAMUSCULAR | Status: AC
  Filled 2014-11-27: qty 2

## 2014-11-27 MED ORDER — PROPOFOL 10 MG/ML IV BOLUS
INTRAVENOUS | Status: AC
  Filled 2014-11-27: qty 20

## 2014-11-27 MED ORDER — SODIUM CHLORIDE 0.9 % IV SOLN: 100.0000 mL | INTRAVENOUS | Status: DC | PRN

## 2014-11-27 MED ORDER — LIDOCAINE HCL (PF) 1 % IJ SOLN: 5.0000 mL | INTRAMUSCULAR | Status: DC | PRN

## 2014-11-27 MED ORDER — HEPARIN SODIUM (PORCINE) 1000 UNIT/ML DIALYSIS
20.0000 [IU]/kg | INTRAMUSCULAR | Status: DC | PRN
  Filled 2014-11-27: qty 2

## 2014-11-27 MED ORDER — GLYCOPYRROLATE 0.2 MG/ML IJ SOLN
INTRAMUSCULAR | Status: AC
  Filled 2014-11-27: qty 1

## 2014-11-27 MED ORDER — SUCCINYLCHOLINE CHLORIDE 20 MG/ML IJ SOLN
INTRAMUSCULAR | Status: AC
  Filled 2014-11-27: qty 1

## 2014-11-27 MED ORDER — THROMBIN 20000 UNITS EX SOLR
CUTANEOUS | Status: AC
  Filled 2014-11-27: qty 20000

## 2014-11-27 MED ORDER — LIDOCAINE HCL (CARDIAC) 20 MG/ML IV SOLN
INTRAVENOUS | Status: DC | PRN
  Administered 2014-11-27: 50 mg via INTRAVENOUS

## 2014-11-27 MED ORDER — PENTAFLUOROPROP-TETRAFLUOROETH EX AERO: 1.0000 "application " | INHALATION_SPRAY | CUTANEOUS | Status: DC | PRN

## 2014-11-27 MED ORDER — ONDANSETRON HCL 4 MG/2ML IJ SOLN
INTRAMUSCULAR | Status: DC | PRN
  Administered 2014-11-27: 4 mg via INTRAVENOUS

## 2014-11-27 MED ORDER — SURGIFOAM 100 EX MISC
CUTANEOUS | Status: DC | PRN
  Administered 2014-11-27: 10:00:00 via TOPICAL

## 2014-11-27 MED ORDER — SODIUM CHLORIDE 0.9 % IV SOLN: INTRAVENOUS | Status: DC

## 2014-11-27 MED ORDER — MIDAZOLAM HCL 2 MG/2ML IJ SOLN
INTRAMUSCULAR | Status: AC
  Filled 2014-11-27: qty 2

## 2014-11-27 MED ORDER — ALTEPLASE 2 MG IJ SOLR
2.0000 mg | Freq: Once | INTRAMUSCULAR | Status: DC | PRN
  Filled 2014-11-27: qty 2

## 2014-11-27 MED ORDER — SODIUM CHLORIDE 0.9 % IJ SOLN
INTRAMUSCULAR | Status: AC
  Filled 2014-11-27: qty 10

## 2014-11-27 MED ORDER — HEPARIN SODIUM (PORCINE) 1000 UNIT/ML DIALYSIS
1000.0000 [IU] | INTRAMUSCULAR | Status: DC | PRN
  Filled 2014-11-27: qty 1

## 2014-11-27 MED ORDER — 0.9 % SODIUM CHLORIDE (POUR BTL) OPTIME
TOPICAL | Status: DC | PRN
  Administered 2014-11-27: 1000 mL

## 2014-11-27 MED ORDER — LIDOCAINE-EPINEPHRINE (PF) 1 %-1:200000 IJ SOLN
INTRAMUSCULAR | Status: AC
  Filled 2014-11-27: qty 10

## 2014-11-27 MED ORDER — MIDAZOLAM HCL 5 MG/5ML IJ SOLN
INTRAMUSCULAR | Status: DC | PRN
  Administered 2014-11-27: 2 mg via INTRAVENOUS

## 2014-11-27 MED ORDER — HEPARIN SODIUM (PORCINE) 1000 UNIT/ML IJ SOLN
INTRAMUSCULAR | Status: AC
  Filled 2014-11-27: qty 1

## 2014-11-27 MED ORDER — LIDOCAINE HCL (CARDIAC) 20 MG/ML IV SOLN
INTRAVENOUS | Status: AC
  Filled 2014-11-27: qty 5

## 2014-11-27 MED ORDER — SODIUM CHLORIDE 0.9 % IR SOLN
Status: DC | PRN
  Administered 2014-11-27: 09:00:00

## 2014-11-27 MED ORDER — ROCURONIUM BROMIDE 50 MG/5ML IV SOLN
INTRAVENOUS | Status: AC
  Filled 2014-11-27: qty 1

## 2014-11-27 SURGICAL SUPPLY — 35 items
ARMBAND PINK RESTRICT EXTREMIT (MISCELLANEOUS) ×3 IMPLANT
CANISTER SUCTION 2500CC (MISCELLANEOUS) ×3 IMPLANT
CLIP TI MEDIUM 6 (CLIP) ×3 IMPLANT
CLIP TI WIDE RED SMALL 6 (CLIP) ×3 IMPLANT
COVER PROBE W GEL 5X96 (DRAPES) ×3 IMPLANT
COVER SURGICAL LIGHT HANDLE (MISCELLANEOUS) ×3 IMPLANT
DECANTER SPIKE VIAL GLASS SM (MISCELLANEOUS) ×3 IMPLANT
ELECT REM PT RETURN 9FT ADLT (ELECTROSURGICAL) ×3
ELECTRODE REM PT RTRN 9FT ADLT (ELECTROSURGICAL) ×1 IMPLANT
GLOVE BIO SURGEON STRL SZ 6.5 (GLOVE) ×4 IMPLANT
GLOVE BIO SURGEON STRL SZ7 (GLOVE) ×3 IMPLANT
GLOVE BIO SURGEONS STRL SZ 6.5 (GLOVE) ×2
GLOVE BIOGEL PI IND STRL 6.5 (GLOVE) ×2 IMPLANT
GLOVE BIOGEL PI IND STRL 7.0 (GLOVE) ×1 IMPLANT
GLOVE BIOGEL PI IND STRL 7.5 (GLOVE) ×1 IMPLANT
GLOVE BIOGEL PI INDICATOR 6.5 (GLOVE) ×4
GLOVE BIOGEL PI INDICATOR 7.0 (GLOVE) ×2
GLOVE BIOGEL PI INDICATOR 7.5 (GLOVE) ×2
GOWN STRL REUS W/ TWL LRG LVL3 (GOWN DISPOSABLE) ×3 IMPLANT
GOWN STRL REUS W/TWL LRG LVL3 (GOWN DISPOSABLE) ×6
KIT BASIN OR (CUSTOM PROCEDURE TRAY) ×3 IMPLANT
KIT ROOM TURNOVER OR (KITS) ×3 IMPLANT
LIQUID BAND (GAUZE/BANDAGES/DRESSINGS) ×3 IMPLANT
NS IRRIG 1000ML POUR BTL (IV SOLUTION) ×3 IMPLANT
PACK CV ACCESS (CUSTOM PROCEDURE TRAY) ×3 IMPLANT
PAD ARMBOARD 7.5X6 YLW CONV (MISCELLANEOUS) ×6 IMPLANT
PROBE PENCIL 8 MHZ STRL DISP (MISCELLANEOUS) IMPLANT
SPONGE SURGIFOAM ABS GEL 100 (HEMOSTASIS) ×3 IMPLANT
SUT MNCRL AB 4-0 PS2 18 (SUTURE) ×3 IMPLANT
SUT PROLENE 6 0 BV (SUTURE) ×3 IMPLANT
SUT PROLENE 7 0 BV 1 (SUTURE) ×3 IMPLANT
SUT VIC AB 3-0 SH 27 (SUTURE) ×2
SUT VIC AB 3-0 SH 27X BRD (SUTURE) ×1 IMPLANT
UNDERPAD 30X30 INCONTINENT (UNDERPADS AND DIAPERS) ×3 IMPLANT
WATER STERILE IRR 1000ML POUR (IV SOLUTION) ×3 IMPLANT

## 2014-11-27 NOTE — Anesthesia Procedure Notes (Signed)
Procedure Name: MAC Date/Time: 11/27/2014 8:35 AM Performed by: Jenne Campus Pre-anesthesia Checklist: Patient identified, Emergency Drugs available, Suction available, Patient being monitored and Timeout performed Patient Re-evaluated:Patient Re-evaluated prior to inductionOxygen Delivery Method: Simple face mask

## 2014-11-27 NOTE — Interval H&P Note (Signed)
[facility]  History and Physical Update  The patient was interviewed and re-examined.  The patient's previous History and Physical has been reviewed and is unchanged from Dr. Oneida Alar' consult.  There is no change in the plan of care: L RC vs BC AVF.  Risk, benefits, and alternatives to access surgery were discussed.  The patient is aware the risks include but are not limited to: bleeding, infection, steal syndrome, nerve damage, ischemic monomelic neuropathy, failure to mature, need for additional procedures, death and stroke.  The patient agrees to proceed forward with the procedure.   Adele Barthel, MD [facility] [place] Office: [phone] Pager: [phone]  11/27/2014, 8:02 AM

## 2014-11-27 NOTE — Progress Notes (Signed)
Hemodialysis- Pt tolerated tx well. Had 1 episode of cramping in lower extremeties relieved with saline bolus. Dr. Jonnie Finner order to cut treatment time x30 minutes. Tx stopped with 23 minutes remaining. Total uf 1.5L.

## 2014-11-27 NOTE — Op Note (Signed)
OPERATIVE NOTE   PROCEDURE: left brachiocephalic arteriovenous fistula placement  PRE-OPERATIVE DIAGNOSIS: end stage renal disease   POST-OPERATIVE DIAGNOSIS: same as above   SURGEON: Adele Barthel, MD  ASSISTANT(S): Silva Bandy, PAC   ANESTHESIA: local and MAC  ESTIMATED BLOOD LOSS: 50 cc  FINDING(S): 1.  Palpable thrill at end of case 2.  Palpable radial pulse at end ofcase  SPECIMEN(S):  none  INDICATIONS:   [name] is a 63 y.o. male who presents with end stage renal disease.  The patient is scheduled for left brachiocephalic arteriovenous fistula placement.  The patient is aware the risks include but are not limited to: bleeding, infection, steal syndrome, nerve damage, ischemic monomelic neuropathy, failure to mature, and need for additional procedures.  The patient is aware of the risks of the procedure and elects to proceed forward.  DESCRIPTION: After full informed written consent was obtained from the patient, the patient was brought back to the operating room and placed supine upon the operating table.  Prior to induction, the patient received IV antibiotics.   After obtaining adequate anesthesia, the patient was then prepped and draped in the standard fashion for a left arm access procedure.  I turned my attention first to identifying the patient's cephalic vein and brachial artery.  Using SonoSite guidance, the location of these vessels were marked out on the skin.   At this point, I injected local anesthetic to obtain a field block of the antecubitum.  In total, I injected about `5 mL of a 1:1 mixture of 0.5% Marcaine without epinephrine and 1% lidocaine with epinephrine.  I made a transverse incision at the level of the antecubitum and dissected through the subcutaneous tissue and fascia to gain exposure of the brachial artery.  This was noted to be 5 mm in diameter externally.  This was dissected out proximally and distally and controlled with vessel loops .  I  then dissected out the cephalic vein.  This was noted to be 4-5 mm in diameter externally.  The distal segment of the vein was ligated with a  2-0 silk, and the vein was transected.  The proximal segment was iinterrogated with serial dilators.  The vein accepted up to a 5 mm dilator without any difficulty.  I then instilled the heparinized saline into the vein and clamped it.  At this point, I reset my exposure of the brachial artery and placed the artery under tension proximally and distally.  I made an arteriotomy with a #11 blade, and then I extended the arteriotomy with a Potts scissor.  I injected heparinized saline proximal and distal to this arteriotomy.  The vein was then sewn to the artery in an end-to-side configuration with a running stitch of 7-0 Prolene.  Prior to completing this anastomosis, I allowed the vein and artery to backbleed.  There was no evidence of clot from any vessels.  I completed the anastomosis in the usual fashion and then released all vessel loops and clamps.  There was a palpable thrill in the venous outflow, and there was a palpable radial pulse.  At this point, I irrigated out the surgical wound.  There was no further active bleeding.  The subcutaneous tissue was reapproximated with a running stitch of 3-0 Vicryl.  The skin was then reapproximated with a running subcuticular stitch of 4-0 Vicryl.  The skin was then cleaned, dried, and reinforced with Dermabond.  The patient tolerated this procedure well.   COMPLICATIONS: none  CONDITION: stable  Adele Barthel, MD [facility] [place] Office: [phone] Pager: [phone]  11/27/2014, 9:49 AM

## 2014-11-27 NOTE — Procedures (Signed)
I was present at this dialysis session, have reviewed the session itself and made  appropriate changes  Kelly Splinter MD (pgr) [phone]    ([phone] 11/27/2014, 2:17 PM

## 2014-11-27 NOTE — Anesthesia Postprocedure Evaluation (Signed)
  Anesthesia Post-op Note  Patient: [name]  Procedure(s) Performed: Procedure(s): ARTERIOVENOUS (AV) FISTULA CREATION (Left)  Patient Location: PACU  Anesthesia Type:MAC  Level of Consciousness: awake and alert   Airway and Oxygen Therapy: Patient Spontanous Breathing  Post-op Pain: mild  Post-op Assessment: Post-op Vital signs reviewed, Patient's Cardiovascular Status Stable, Respiratory Function Stable, Patent Airway and No signs of Nausea or vomiting  Post-op Vital Signs: Reviewed and stable  Last Vitals:  Filed Vitals:   11/27/14 1030  BP: 136/70  Pulse: 74  Temp:   Resp: 18    Complications: No apparent anesthesia complications

## 2014-11-27 NOTE — Transfer of Care (Signed)
Immediate Anesthesia Transfer of Care Note  Patient: [name]  Procedure(s) Performed: Procedure(s): ARTERIOVENOUS (AV) FISTULA CREATION (Left)  Patient Location: PACU  Anesthesia Type:MAC  Level of Consciousness: awake, oriented and patient cooperative  Airway & Oxygen Therapy: Patient Spontanous Breathing and Patient connected to nasal cannula oxygen  Post-op Assessment: Report given to PACU RN and Post -op Vital signs reviewed and stable  Post vital signs: Reviewed  Complications: No apparent anesthesia complications

## 2014-11-27 NOTE — Progress Notes (Signed)
Subjective:  Back in room from  L UA AVF insert / no cos  Objective Vital signs in last 24 hours: Filed Vitals:   11/27/14 1024 11/27/14 1030 11/27/14 1045 11/27/14 1135  BP:  136/70 152/80 137/69  Pulse:  74 74 84  Temp:   97.3 F (36.3 C) 98.5 F (36.9 C)  TempSrc:    Oral  Resp:  _0 Height:      Weight:      SpO2: 100%  98% 98%   Weight change: -1 kg (-2 lb 3.3 oz)   Physical Exam: General: awoken from sleep NAD , thin BM , appropriate OX 4  Heart: RRR 1/6 sem lsb, No gallop , or rub Lungs: CTA Bilat . nonlabored Abdomen: Soft NT. ND Extremities: no pedal edema Dialysis Access: R ij Perm cath / L UA AVF  Problem/Plan 1. 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 S/P [facility] 11/28 /15 by IR.AVF/ Placement LUA AVF By [facility] this am/ Clip process started for OP kid center, Financial counselor will need to see (uninsured). For fourth HD today  2. Anemia - Hgb 5.3 admit up to 7.7> 7.9  + Fe deficiency. s/p  PRBC Replete iron. Started Aranesp 25. Hemoccult stools Pos. Elba Barman Per admit with Gi Seeing planning colonoscopy/ egd Dr. Fuller Plan 3. MBD - PTH pending and phosphorus 4.8 Ca 9.0 corrected/ no binder 4. HTN - bp 137/69 on Meds/volume control with HD - gentle lowering of vol. With new hd/ hydralazine 850m tid / Isosorbide 327m q d, lisinopril 50m650m day change to hs/ coreg 12.5 mg bid ,/ Has CM With Echo = LVEF 20-25 % and Card EP= Dr. KleTsosie Billing Card.- sp card cath = normal coronary arteries,Hypertensive / Non-ischemic Cardiomyopathy with improved LVEF by LV Gram andSevere Secondary Pulmonary Hypertension with suggestion of possible mitral valve disease 6. DM - no meds / Diet control now / A1C 5.7 7. Dyspnea - multifactorial= likely related to his severe anemia and volume issues and CM/ resolved  8. H/o toe amputation in the past 9. Nutrition - alb 2.7 anorexia improving after HD txs - needs education = Wife Given NKF website information and  asked nutrition to see   DavErnest HaberA-C [phone]/01/2014,12:31 PM  LOS: 5 days   Pt seen, examined and agree w A/P as above.  RobKelly Splinter pager [phone] cell [phone]/01/2014, 2:17 PM    Labs: Basic Metabolic Panel:  Recent Labs Lab 11/23/14 1355 11/24/14 0407 11/25/14 1320 11/26/14 0424  NA 137 136* 135* 134*  K 4.9 4.5 4.2 4.2  CL 103 101 98 96  CO2 13* 18* 21 22  GLUCOSE 111* 97 97 88  BUN 101* 67* 52* 35*  CREATININE 13.04* 9.76* 8.08* 5.97*  CALCIUM 8.0* 7.6* 7.5* 7.9*  PHOS 7.6* 4.8* 4.8*  --    Liver Function Tests:  Recent Labs Lab 11/23/14 1355 11/24/14 0407 11/25/14 1320  ALBUMIN 3.0* 2.7* 2.7*     Recent Labs Lab 11/23/14 2120 11/24/14 0407 11/25/14 1320 11/26/14 0424 11/27/14 0442  WBC 9.0 8.4 8.8 7.7 10.1  HGB 7.8* 7.5* 7.8* 7.7* 7.9*  HCT 23.2* 22.7* 23.8* 23.8* 25.0*  MCV 78.9 79.1 82.4 83.5 82.0  PLT 290 286 232 248 274   Cardiac Enzymes:  Recent Labs Lab 11/22/14 1704 11/22/14 2205 11/23/14 1355  TROPONINI <0.30 <0.30 <0.30   CBG:  Recent Labs Lab  11/26/14 1752 11/26/14 2202 11/27/14 0623 11/27/14 1018 11/27/14 1145  GLUCAP 230* 110* 109* 113* 117*    Studies/Results: No results found. Medications: . sodium chloride 1 mL/kg/hr (11/26/14 1445)  . sodium chloride     . sodium chloride   Intravenous Once  . sodium chloride   Intravenous Once  . bisacodyl  5 mg Oral Once  . carvedilol  12.5 mg Oral BID WC  . darbepoetin (ARANESP) injection - DIALYSIS  25 mcg Intravenous Q Wed-HD  . docusate sodium  100 mg Oral BID  . [MAR Hold] ferric gluconate (FERRLECIT/NULECIT) IV  125 mg Intravenous Daily  . hydrALAZINE  50 mg Oral 3 times per day  . insulin aspart  0-5 Units Subcutaneous QHS  . insulin aspart  0-9 Units Subcutaneous TID WC  . isosorbide mononitrate  30 mg Oral Daily  . lisinopril  5 mg Oral Daily  . multivitamin  1 tablet Oral QHS  . pantoprazole   40 mg Oral Q1200  . peg 3350 powder  0.5 kit Oral Once   And  . [START ON 11/28/2014] peg 3350 powder  0.5 kit Oral Once  . sodium chloride  3 mL Intravenous Q12H  . sodium chloride  3 mL Intravenous Q12H  . sodium chloride  3 mL Intravenous Q12H

## 2014-11-27 NOTE — Progress Notes (Addendum)
11/27/2014 4:19 PM Hemodialysis Outpatient Update; this patient has an appointment tomorrow morning with hospital financial officer to fill out paperwork for [employer]. This is very important so patient can go forward with outpatient dialysis on Friday. Thank you. Gordy Savers 4:28 PM This patient has been accepted at the [place] center on a Monday, Wednesday and Friday 2nd shift schedule. See above note. Gordy Savers

## 2014-11-27 NOTE — H&P (View-Only) (Signed)
[facility] Consult   MRN : [id number]  Reason for Consult: permanent dialysis access  Referring Physician: Dr. Jonnie Finner  History of Present Illness: 63 y/o male who presents for permanent dialysis access. He is right handed. He has never had access procedures before. He has a chronic upper extremity tremor.  He presented to the [facility] ED on on 11/22/14 for progressive shortness of breath over the past month with occasional sharp chest pain. He was transferred to [facility] from [place] after his creatinine was found to be 13.7 with profound anemia, hyperkalemia and metabolic acidosis. He had a TDC placed by IR on 11/28 and was dialyzed that day.   He was seen at [facility] in the past but was lost to follow up due to insurance loss. He was told that he had proteinuria and diabetic nephropathy.   He has hypertension managed on multiple antihypertensives. He has insulin dependent diabetes. He had an I&D of an abscess of his left wrist and hand by Dr. Amedeo Plenty in 2013.    Current Facility-Administered Medications  Medication Dose Route Frequency Provider Last Rate Last Dose  . 0.9 %  sodium chloride infusion  10 mL/hr Intravenous Once Virgel Manifold, MD      . 0.9 %  sodium chloride infusion  250 mL Intravenous PRN Toy Baker, MD      . 0.9 %  sodium chloride infusion   Intravenous Once Shanon Brow Tat, MD      . 0.9 %  sodium chloride infusion   Intravenous Once [name], PA-C      . acetaminophen (TYLENOL) tablet 650 mg  650 mg Oral Q6H PRN Toy Baker, MD   650 mg at 11/24/14 9323   Or  . acetaminophen (TYLENOL) suppository 650 mg  650 mg Rectal Q6H PRN Toy Baker, MD      . bisacodyl (DULCOLAX) EC tablet 5 mg  5 mg Oral Once Sarah J Gribbin, PA-C       And  . bisacodyl (DULCOLAX) EC tablet 5 mg  5 mg Oral Once Vena Rua, PA-C      . carvedilol (COREG) tablet 6.25 mg  6.25 mg Oral BID WC Deboraha Sprang, MD   6.25 mg at  11/24/14 1734  . Darbepoetin Alfa (ARANESP) injection 25 mcg  25 mcg Intravenous Q Mon-HD [name], PA-C      . docusate sodium (COLACE) capsule 100 mg  100 mg Oral BID Toy Baker, MD   100 mg at 11/25/14 1048  . feeding supplement (GLUCERNA SHAKE) (GLUCERNA SHAKE) liquid 237 mL  237 mL Oral BID WC Baird Lyons, RD   237 mL at 11/25/14 1049  . ferric gluconate (NULECIT) 125 mg in sodium chloride 0.9 % 100 mL IVPB  125 mg Intravenous Daily [name], MD   125 mg at 11/24/14 1102  . hydrALAZINE (APRESOLINE) tablet 25 mg  25 mg Oral 3 times per day Deboraha Sprang, MD   25 mg at 11/24/14 2229  . HYDROcodone-acetaminophen (NORCO/VICODIN) 5-325 MG per tablet 1-2 tablet  1-2 tablet Oral Q4H PRN Toy Baker, MD   1 tablet at 11/23/14 0550  . insulin aspart (novoLOG) injection 0-5 Units  0-5 Units Subcutaneous QHS Toy Baker, MD   0 Units at 11/23/14 0045  . insulin aspart (novoLOG) injection 0-9 Units  0-9 Units Subcutaneous TID WC Toy Baker, MD   1 Units at 11/24/14 1733  . isosorbide mononitrate (IMDUR) 24  hr tablet 30 mg  30 mg Oral Daily Deboraha Sprang, MD      . lisinopril (PRINIVIL,ZESTRIL) tablet 5 mg  5 mg Oral Daily Orson Eva, MD   5 mg at 11/24/14 1258  . multivitamin (RENA-VIT) tablet 1 tablet  1 tablet Oral QHS [name], PA-C   1 tablet at 11/24/14 2229  . ondansetron (ZOFRAN) tablet 4 mg  4 mg Oral Q6H PRN Toy Baker, MD       Or  . ondansetron (ZOFRAN) injection 4 mg  4 mg Intravenous Q6H PRN Toy Baker, MD      . pantoprazole (PROTONIX) EC tablet 40 mg  40 mg Oral Q1200 Toy Baker, MD   40 mg at 11/25/14 1048  . peg 3350 powder (MOVIPREP) kit 100 g  0.5 kit Oral Once Orson Eva, MD       And  . Derrill Memo ON 11/26/2014] peg 3350 powder (MOVIPREP) kit 100 g  0.5 kit Oral Once Shanon Brow Tat, MD      . sodium chloride 0.9 % injection 3 mL  3 mL Intravenous Q12H Toy Baker, MD   3 mL at 11/25/14 1049  . sodium  chloride 0.9 % injection 3 mL  3 mL Intravenous Q12H Toy Baker, MD   3 mL at 11/25/14 1048  . sodium chloride 0.9 % injection 3 mL  3 mL Intravenous PRN Toy Baker, MD        Past Medical History  Diagnosis Date  . Diabetes mellitus   . HTN (hypertension)   . Osteomyelitis     Past Surgical History  Procedure Laterality Date  . Left 4th toe amputation    . Cholecystectomy    . Tonsillectomy    . I&d extremity  01/28/2012    Procedure: IRRIGATION AND DEBRIDEMENT EXTREMITY;  Surgeon: Paulene Floor, MD;  Location: [facility];  Service: Orthopedics;  Laterality: Left;  irrigation and drainage left hand  . I&d extremity  01/30/2012    Procedure: IRRIGATION AND DEBRIDEMENT EXTREMITY;  Surgeon: Paulene Floor, MD;  Location: [facility];  Service: Orthopedics;  Laterality: Left;  flexor teno synovectomy and extensor tenosynovectomy arthrosynovectomy wristjoint  . I&d extremity  02/01/2012    Procedure: IRRIGATION AND DEBRIDEMENT EXTREMITY;  Surgeon: Paulene Floor, MD;  Location: [facility];  Service: Orthopedics;  Laterality: Left;  Left Wrist, Hand and Forearm    Social History History  Substance Use Topics  . Smoking status: Former Smoker -- 0.50 packs/day for 4 years    Types: Cigarettes  . Smokeless tobacco: Never Used  . Alcohol Use: No    Family History Family History  Problem Relation Age of Onset  . Heart disease Mother   . Diabetes Father   . Diabetes Brother   . Diabetes Sister   . Diabetes Brother   . Diabetes Brother     Allergies  Allergen Reactions  . Penicillins Hives and Swelling     REVIEW OF SYSTEMS  General: '[ ]'  Weight loss, '[ ]'  Fever, '[ ]'  chills Neurologic: '[ ]'  Dizziness, '[ ]'  Blackouts, '[ ]'  Seizure '[ ]'  Stroke, '[ ]'  "Mini stroke", '[ ]'  Slurred speech, '[ ]'  Temporary blindness; '[ ]'  weakness in arms or legs, '[ ]'  Hoarseness '[ ]'  Dysphagia Cardiac: '[ ]'  Chest pain/pressure, '[ ]'  Shortness of breath at rest '[ ]'  Shortness of breath with  exertion, '[ ]'  Atrial fibrillation or irregular heartbeat  Vascular: '[ ]'  Pain in legs with walking, '[ ]'   Pain in legs at rest, '[ ]'  Pain in legs at night,  '[ ]'  Non-healing ulcer, '[ ]'  Blood clot in vein/DVT,   Pulmonary: '[ ]'  Home oxygen, '[ ]'  Productive cough, '[ ]'  Coughing up blood, '[ ]'  Asthma,  '[ ]'  Wheezing '[ ]'  COPD Musculoskeletal:  '[ ]'  Arthritis, '[ ]'  Low back pain, '[ ]'  Joint pain Hematologic: '[ ]'  Easy Bruising, '[ ]'  Anemia; '[ ]'  Hepatitis Gastrointestinal: '[ ]'  Blood in stool, '[ ]'  Gastroesophageal Reflux/heartburn, Urinary: [ x] chronic Kidney disease, [x ] on HD - '[ ]'  MWF or [x ] TTHS, '[ ]'  Burning with urination, '[ ]'  Difficulty urinating Skin: '[ ]'  Rashes, '[ ]'  Wounds Psychological: '[ ]'  Anxiety, '[ ]'  Depression  Physical Examination Filed Vitals:   11/24/14 1733 11/24/14 2056 11/25/14 0445 11/25/14 0900  BP: 167/86 141/68 134/71 129/59  Pulse: 87 76 71 74  Temp: 98.1 F (36.7 C) 99.8 F (37.7 C) 98.7 F (37.1 C) 98.9 F (37.2 C)  TempSrc: Oral  Oral Oral  Resp: '18 17 18 20  ' Height:      Weight:  187 lb (84.823 kg)    SpO2: 96% 97% 99% 96%   Body mass index is 24.68 kg/(m^2).  General:  WDWN in NAD Gait: Normal HENT: WNL Eyes: Pupils equal Pulmonary: normal non-labored breathing , without Rales, rhonchi,  wheezing Cardiac: RRR, without  Murmurs, rubs or gallops; No carotid bruits Abdomen: soft, NT, no masses Skin: no rashes, ulcers noted;  no Gangrene , no cellulitis; no open wounds;  Vascular Exam/Pulses: 2+ radial pulses bilateral. Palpable brachial pulses bilaterally. 2+ dorsalis pedis and posterior tibial pulses bilaterally. 2 cm vertical scar on left wrist from previous I&D.  Musculoskeletal: no muscle wasting or atrophy; no edema  Neurologic: A&O X 3; Appropriate Affect ;  SENSATION: normal; bilateral hand tremors MOTOR FUNCTION: 5/5 Symmetric Speech is fluent/normal   Significant Diagnostic Studies: CBC Lab Results  Component Value Date   WBC 8.4 11/24/2014   HGB  7.5* 11/24/2014   HCT 22.7* 11/24/2014   MCV 79.1 11/24/2014   PLT 286 11/24/2014    BMET    Component Value Date/Time   NA 136* 11/24/2014 0407   K 4.5 11/24/2014 0407   CL 101 11/24/2014 0407   CO2 18* 11/24/2014 0407   GLUCOSE 97 11/24/2014 0407   BUN 67* 11/24/2014 0407   CREATININE 9.76* 11/24/2014 0407   CALCIUM 7.6* 11/24/2014 0407   GFRNONAA 5* 11/24/2014 0407   GFRAA 6* 11/24/2014 0407   Estimated Creatinine Clearance: 8.8 mL/min (by C-G formula based on Cr of 9.76).  COAG Lab Results  Component Value Date   INR 1.18 08/10/2011    ASSESSMENT/PLAN: This is a 63 year old male with ESRD on HD. He is currently dialyzing through a tunneled dialysis catheter placed by IR on 11/23/14. He will need permanent dialysis access. The patient is right handed.   -Based on physical exam and vein mapping, his access options include left radial-cephalic versus brachial cephalic fistula. He also has acceptable fistula options on the right and basilic vein options bilaterally.  -Plan for access this week.  -Dr. Oneida Alar to see patient.    Virgina Jock, PA-C Vascular Surgery [phone]  History and exam findings as above.  Pt prefers left arm access.  Vein mapping shows good cephalic vein bilaterally.  Currently has IV in his left wrist cephalic vein.  Will remove this today.  Plan for left arm AV fistula radial vs brachial cephalic  Dr Bridgett Larsson Wednesday 12/2  Ruta Hinds, MD [facility] [place] Office: [phone] Pager: [phone]

## 2014-11-27 NOTE — Anesthesia Preprocedure Evaluation (Addendum)
Anesthesia Evaluation  Patient identified by MRN, date of birth, ID band Patient awake    Reviewed: Allergy & Precautions, H&P , NPO status , Patient's Chart, lab work & pertinent test results, reviewed documented beta blocker date and time   History of Anesthesia Complications Negative for: history of anesthetic complications  Airway Mallampati: II  TM Distance: >3 FB Neck ROM: Full    Dental  (+) Dental Advisory Given, Missing, Poor Dentition,    Pulmonary former smoker,  breath sounds clear to auscultation        Cardiovascular hypertension, Pt. on medications and Pt. on home beta blockers +CHF Rhythm:Regular  Cath 11/26/14 Normal coronaries, non ischemic cardiomyopathy, EF markedly improved from ECHO 11/23/14 which showed EF 49f 20-25%.  EKG L BBB   Neuro/Psych negative neurological ROS  negative psych ROS   GI/Hepatic negative GI ROS, Neg liver ROS,   Endo/Other  diabetes, Poorly Controlled, Type 2, Insulin Dependent  Renal/GU Dialysis and ESRFRenal diseaseadmitted in RF New HD patient     Musculoskeletal   Abdominal (+)  Abdomen: soft.    Peds  Hematology  (+) anemia , Improved at 7/23.  Has received PRBC   Anesthesia Other Findings   Reproductive/Obstetrics negative OB ROS                           Anesthesia Physical Anesthesia Plan  ASA: III  Anesthesia Plan: MAC and General   Post-op Pain Management:    Induction: Intravenous  Airway Management Planned: LMA, Oral ETT and Nasal Cannula  Additional Equipment:   Intra-op Plan:   Post-operative Plan:   Informed Consent: I have reviewed the patients History and Physical, chart, labs and discussed the procedure including the risks, benefits and alternatives for the proposed anesthesia with the patient or authorized representative who has indicated his/her understanding and acceptance.     Plan Discussed with:    Anesthesia Plan Comments: (Cardiac cath much better than ECHO.  Would like MAC if possible)        Anesthesia Quick Evaluation

## 2014-11-27 NOTE — Progress Notes (Addendum)
Patient Demographics  [name], is a 63 y.o. male, DOB - May 28, 1951, GKK:[id number]  Admit date - 11/22/2014   Admitting Physician Toy Baker, MD  Outpatient Primary MD for the patient is [name], MD  LOS - 5   Chief Complaint  Patient presents with  . Chest Pain  . Shortness of Breath       Brief history  63 year old male with history of diabetes mellitus, CKD, iron deficiency anemia, hypertension presented with 1 month history of chest discomfort and shortness of breath. The patient has not seen a physician for nearly 2 years. Upon presentation, he was found to have a serum creatinine of 13.72 and hemoglobin of 5.3. The patient stated that he has only been taking his 70/30 insulin on a when necessary basis when his CBGs are greater than 150. He states that he is only been checking his CBGs once daily. The patient stated that he has been having increasing dyspnea on exertion with intermittent chest discomfort. He denies any NSAID use. He denies any hematemesis, hematochezia, melena, hematuria. The patient states that he has not taken any antihypertensive medications for at least 6 months. Workup revealed a positive FOBT. Gastroenterology was consulted and continues to follow the patient. Echocardiogram was performed and revealed EF 20-25% with severe pulmonary artery hypertension. As a result, cardiology was consulted and feels that the patient needs a right and left heart catheterization--scheduled 11/26/14. nephrology was consulted, and the temporary dialysis catheter was placed and the patient was initiated on dialysis on 11/23/2014.Gastroenterology plans upper and lower endoscopy.    Subjective:   [name] today has, No headache, No chest pain, No abdominal pain - No  Nausea, No new weakness tingling or numbness, No Cough - SOB.    Assessment & Plan    1. Acute on chronic renal failure now ESRD. Been dialyzed through a right IJ dialysis catheter placed 11/23/2014, left arm AV fistula placed by vascular surgery on 11/27/2014. Clip process likely will be done today. Likely discharge tomorrow.    2. Symptomatic anemia of chronic disease along with iron deficiency anemia. GI following likely will require upper and lower endoscopy. He status post 4 units of packed RBC transfusion this admission, H&H stable, also getting IV iron per renal.    3. 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 Cardiology following, symptomatically improved after fluid removal through dialysis, status post right and left heart cath shows normal coronaries and confirms severe pulmonary hypertension. Currently on combination of Coreg, hydralazine, Imdur, lisinopril along with dialysis for fluid removal. Continue to monitor. Further workup per cardiology were following the patient.    4. SVT/atrial tachycardia. Symptom-free cardiology following, telemetry, continue Coreg.    5. DM type II. Noncompliant with home regimen which was 7030, A1c 5.7. Currently on sliding scale.    6. Essential hypertension in poor control. Currently stable on a combination of Coreg, lisinopril, hydralazine along with Imdur. Follow closely.     Code Status: Full  Family Communication: none present  Disposition Plan: Home likely 11/28/2014 or 20 04/15/2014   Procedures    L arm AV fistula 11-27-14 Dr Mariane Baumgarten Dialysis catheter IR 11-23-14   EGD and colonoscopy pending likely 11/28/2014  TTE  - Left ventricle: The cavity size was at the upper limits of normal. Wall thickness was increased in a pattern of moderateLVH. Systolic function was severely reduced. The estimatedejection fraction was in the range of 20% to 25%. Diffusehypokinesis.  Doppler parameters are consistent with restrictivephysiology, indicative of decreased left ventricular diastolic compliance and/or increased left atrial pressure. - Left atrium: The atrium was moderately dilated. - Right ventricle: Systolic function was moderately reduced. - Right atrium: The atrium was mildly dilated. - Tricuspid valve: There was moderate-severe regurgitation. - Pulmonary arteries: Systolic pressure was severely increased. PA peak pressure: 98 mm Hg (S).  Mitral valve:  Structurally normal valve.  Leaflet separation was normal. Doppler: Transvalvular velocity was within the normal range. There was no evidence for stenosis. There was trivial regurgitation.  Peak gradient (D): 7 mm Hg.   Left and right heart cath 11/26/2014. 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 sodium chloride   Intravenous Once  . sodium chloride   Intravenous Once  . bisacodyl  5 mg Oral Once  . carvedilol  12.5 mg Oral BID WC  . darbepoetin (ARANESP) injection - DIALYSIS  25 mcg Intravenous Q Wed-HD  . docusate sodium  100 mg Oral BID  . [MAR Hold] ferric gluconate (FERRLECIT/NULECIT) IV  125 mg Intravenous Daily  . hydrALAZINE  50 mg Oral 3 times per day  . insulin aspart  0-5 Units Subcutaneous QHS  . insulin aspart  0-9 Units Subcutaneous TID WC  . isosorbide mononitrate  30 mg Oral Daily  . lisinopril  5 mg Oral Daily  . multivitamin  1 tablet Oral QHS  . pantoprazole  40 mg Oral Q1200  . peg 3350 powder  0.5 kit Oral Once   And  . [START ON 11/28/2014] peg 3350 powder  0.5 kit Oral Once  . sodium chloride  3 mL Intravenous Q12H  . sodium  chloride  3 mL Intravenous Q12H  . sodium chloride  3 mL Intravenous Q12H   Continuous Infusions: . sodium chloride 1 mL/kg/hr (11/26/14 1445)  . sodium chloride     PRN Meds:.sodium chloride, sodium chloride, sodium chloride, sodium chloride, acetaminophen **OR** acetaminophen, alteplase, feeding supplement (NEPRO CARB STEADY), heparin, heparin, HYDROcodone-acetaminophen, labetalol, lidocaine (PF), lidocaine-prilocaine, morphine injection, ondansetron **OR** ondansetron (ZOFRAN) IV, pentafluoroprop-tetrafluoroeth, sodium chloride, sodium chloride  DVT Prophylaxis   SCD due to GI bleed    Lab Results  Component Value Date   PLT 274 11/27/2014    Antibiotics     Anti-infectives    Start     Dose/Rate Route Frequency Ordered Stop   11/27/14 0000  [MAR Hold]  vancomycin (VANCOCIN) IVPB 1000 mg/200 mL premix     (MAR Hold since 11/27/14 0732)   1,000 mg200 mL/hr over 60 Minutes Intravenous To Surgery 11/25/14 1606 11/27/14 0910   11/23/14 1200  vancomycin (VANCOCIN) IVPB 1000 mg/200 mL premix     1,000 mg200 mL/hr over 60 Minutes Intravenous  Once 11/23/14 1148 11/23/14 1300          Objective:   Filed Vitals:   11/27/14 1024 11/27/14 1030 11/27/14 1045 11/27/14 1135  BP:  136/70 152/80 137/69  Pulse:  74  74 84  Temp:   97.3 F (36.3 C) 98.5 F (36.9 C)  TempSrc:    Oral  Resp:  '18 16 20  ' Height:      Weight:      SpO2: 100%  98% 98%    Wt Readings from Last 3 Encounters:  11/27/14 86.6 kg (190 lb 14.7 oz)  01/28/12 102.059 kg (225 lb)     Intake/Output Summary (Last 24 hours) at 11/27/14 1301 Last data filed at 11/27/14 0945  Gross per 24 hour  Intake    300 ml  Output      0 ml  Net    300 ml     Physical Exam  Awake Alert, Oriented X 3, No new F.N deficits, Normal affect [place].AT,PERRAL Supple Neck,No JVD, No cervical lymphadenopathy appriciated.  Symmetrical Chest wall movement, Good air movement bilaterally, CTAB RRR,No Gallops,Rubs or new Murmurs, No  Parasternal Heave +ve B.Sounds, Abd Soft, No tenderness, No organomegaly appriciated, No rebound - guarding or rigidity. No Cyanosis, Clubbing or edema, No new Rash or bruise    Right IJ dialysis catheter in place, left arm dialysis AV fistula site stable  Data Review   Micro Results Recent Results (from the past 240 hour(s))  Urine culture     Status: None   Collection Time: 11/22/14  5:40 PM  Result Value Ref Range Status   Specimen Description URINE, RANDOM  Final   Special Requests NONE  Final   Culture  Setup Time   Final    11/22/2014 21:38 Performed at [place] Performed at [facility]   Final   Culture NO GROWTH Performed at [facility]   Final   Report Status 11/23/2014 FINAL  Final  Surgical pcr screen     Status: None   Collection Time: 11/26/14  6:59 AM  Result Value Ref Range Status   MRSA, PCR NEGATIVE NEGATIVE Final   Staphylococcus aureus NEGATIVE NEGATIVE Final    Comment:        The Xpert SA Assay (FDA approved for NASAL specimens in patients over 14 years of age), is one component of a comprehensive surveillance program.  Test performance has been validated by [employer] for patients greater than or equal to 106 year old. It is not intended to diagnose infection nor to guide or monitor treatment.     Radiology Reports Dg Chest 2 View (if Patient Has Fever And/or Copd)  11/22/2014   CLINICAL DATA:  Sternal chest pain and dizziness for 2 days, shortness of breath for a month, sore throat since last week, chills since this summer, personal history of hypertension, diabetes, former smoker  EXAM: CHEST  2 VIEW  COMPARISON:  01/28/2012  FINDINGS: Enlargement of cardiac silhouette with borderline vascular congestion.  Mediastinal contours normal.  Lungs clear.  No infiltrate, pleural effusion or pneumothorax.  Mild biconvex thoracolumbar scoliosis without acute osseous findings.  IMPRESSION:  Enlargement of cardiac silhouette.  No acute abnormalities.   Electronically Signed   By: Lavonia Dana M.D.   On: 11/22/2014 16:09   Ct Head Wo Contrast  11/23/2014   CLINICAL DATA:  63 year old male with intermittent visual changes  EXAM: CT HEAD WITHOUT CONTRAST  TECHNIQUE: Contiguous axial images were obtained from the base of the skull through the vertex without intravenous contrast.  COMPARISON:  None  FINDINGS: Negative for acute intracranial hemorrhage, acute infarction, mass, mass effect, hydrocephalus or midline shift. Gray-white differentiation  is preserved throughout. Very mild atrophy. Mild periventricular and subcortical white matter hypoattenuation most consistent with the sequelae of longstanding microvascular ischemic white matter disease. No focal soft tissue or calvarial abnormality. No focal global abnormality. The orbits are symmetric. Normal aeration of the mastoid air cells and visualized paranasal sinuses. Trace atherosclerotic calcification in the bilateral cavernous carotid arteries.  IMPRESSION: 1. No acute intracranial abnormality. 2. 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 3. Intracranial atherosclerosis.   Electronically Signed   By: Jacqulynn Cadet M.D.   On: 11/23/2014 03:08   US Renal  11/23/2014   CLINICAL DATA:  Renal failure  EXAM: RENAL/URINARY TRACT ULTRASOUND COMPLETE  COMPARISON:  None.  FINDINGS: Right Kidney:  Length: 10.0 cm.  5 mm interpolar calculus.  No hydronephrosis.  Left Kidney:  Length: 9.7 cm.  No mass or hydronephrosis.  Bladder:  Within normal limits.  IMPRESSION: 5 mm interpolar right renal calculus.  No hydronephrosis.   Electronically Signed   By: Julian Hy M.D.   On: 11/23/2014 12:40      Ir US Guide Vasc Access Right  11/23/2014   CLINICAL DATA:  Progressive renal insufficiency, needs long-term access for hemodialysis  EXAM: TUNNELED HEMODIALYSIS CATHETER PLACEMENT WITH ULTRASOUND AND FLUOROSCOPIC GUIDANCE   TECHNIQUE: The procedure, risks, benefits, and alternatives were explained to the patient. Questions regarding the procedure were encouraged and answered. The patient understands and consents to the procedure. As antibiotic prophylaxis, vancomycin 1 g was ordered pre-procedure and administered intravenously within one hour of incision.Patency of the right IJ vein was confirmed with ultrasound with image documentation. An appropriate skin site was determined. Region was prepped using maximum barrier technique including cap and mask, sterile gown, sterile gloves, large sterile sheet, and Chlorhexidine as cutaneous antisepsis. The region was infiltrated locally with 1% lidocaine.  Intravenous Fentanyl and Versed were administered as conscious sedation during continuous cardiorespiratory monitoring by the radiology RN, with a total moderate sedation time of 11 minutes.  Under real-time ultrasound guidance, the right IJ vein was accessed with a 21 gauge micropuncture needle; the needle tip within the vein was confirmed with ultrasound image documentation. Needle exchanged over the 018 guidewire for transitional dilator, which allowed advancement of a Benson wire into the IVC. Over this, an MPA catheter was advanced. A Hemosplit 19 hemodialysis catheter was tunneled from the right anterior chest wall approach to the right IJ dermatotomy site. The MPA catheter was exchanged over an Amplatz wire for serial vascular dilators which allow placement of a peel-away sheath, through which the catheter was advanced under intermittent fluoroscopy, positioned with its tips in the proximal and midright atrium. Spot chest radiograph confirms good catheter position. No pneumothorax. Catheter was flushed and primed per protocol. Catheter secured externally with O Prolene sutures. The right IJ dermatotomy site was closed with Dermabond.  COMPLICATIONS: COMPLICATIONS None immediate  FLUOROSCOPY TIME:  12 seconds  COMPARISON:  None   IMPRESSION: 1. Technically successful placement of tunneled right IJ hemodialysis catheter with ultrasound and fluoroscopic guidance. Ready for routine use.  ACCESS: Remains approachable for percutaneous intervention as needed.   Electronically Signed   By: Arne Cleveland M.D.   On: 11/23/2014 14:25     CBC  Recent Labs Lab 11/23/14 2120 11/24/14 0407 11/25/14 1320 11/26/14 0424 11/27/14 0442  WBC 9.0 8.4 8.8 7.7 10.1  HGB 7.8* 7.5* 7.8* 7.7* 7.9*  HCT 23.2* 22.7* 23.8* 23.8* 25.0*  PLT 290 286 232 248 274  MCV 78.9 79.1 82.4  83.5 82.0  MCH 26.5 26.1 27.0 27.0 25.9*  MCHC 33.6 33.0 32.8 32.4 31.6  RDW 15.5 15.8* 16.3* 16.3* 17.0*    Chemistries   Recent Labs Lab 11/22/14 2205 11/23/14 1355 11/24/14 0407 11/25/14 1320 11/26/14 0424  NA 134* 137 136* 135* 134*  K 5.3 4.9 4.5 4.2 4.2  CL 101 103 101 98 96  CO2 12* 13* 18* 21 22  GLUCOSE 103* 111* 97 97 88  BUN 108* 101* 67* 52* 35*  CREATININE 13.72* 13.04* 9.76* 8.08* 5.97*  CALCIUM 8.1* 8.0* 7.6* 7.5* 7.9*   ------------------------------------------------------------------------------------------------------------------ estimated creatinine clearance is 14.3 mL/min (by C-G formula based on Cr of 5.97). ------------------------------------------------------------------------------------------------------------------ No results for input(s): HGBA1C in the last 72 hours. ------------------------------------------------------------------------------------------------------------------ No results for input(s): CHOL, HDL, LDLCALC, TRIG, CHOLHDL, LDLDIRECT in the last 72 hours. ------------------------------------------------------------------------------------------------------------------ No results for input(s): TSH, T4TOTAL, T3FREE, THYROIDAB in the last 72 hours.  Invalid input(s): FREET3 ------------------------------------------------------------------------------------------------------------------ No results for  input(s): VITAMINB12, FOLATE, FERRITIN, TIBC, IRON, RETICCTPCT in the last 72 hours.  Coagulation profile  Recent Labs Lab 11/26/14 0810 11/27/14 0442  INR 1.37 1.33    No results for input(s): DDIMER in the last 72 hours.  Cardiac Enzymes  Recent Labs Lab 11/22/14 1704 11/22/14 2205 11/23/14 1355  TROPONINI <0.30 <0.30 <0.30   ------------------------------------------------------------------------------------------------------------------ Invalid input(s): POCBNP     Time Spent in minutes   35   Moise Friday K M.D on 11/27/2014 at 1:01 PM  Between 7am to 7pm - Pager - [phone]  After 7pm go to [email or web address] - password [email or web address]  And look for the night coverage person covering for me after hours  [facility] Office  [phone]

## 2014-11-27 NOTE — Progress Notes (Signed)
Patient Profile: 63 y/o male with h/o poorly controlled HTN, DM, CKD on HD, presented with chest pain and dyspnea. 2D ehco demonstrated an Ef of 20-25%. Referred for LHC. Feels tired and still with occasional dyspnea.   Subjective: Denies chest pain. Also denies groin, low back and flank pain.   Objective: Vital signs in last 24 hours: Temp:  [98.5 F (36.9 C)-99.6 F (37.6 C)] 99 F (37.2 C) (12/02 0500) Pulse Rate:  [71-79] 71 (12/02 0500) Resp:  [0-28] 18 (12/02 0500) BP: (131-164)/(56-73) 138/67 mmHg (12/02 0500) SpO2:  [95 %-100 %] 98 % (12/02 0500) Weight:  [190 lb 14.7 oz (86.6 kg)] 190 lb 14.7 oz (86.6 kg) (12/02 0500) Last BM Date: 11/26/14  Intake/Output from previous day:   Intake/Output this shift:    Medications Current Facility-Administered Medications  Medication Dose Route Frequency Provider Last Rate Last Dose  . 0.9 %  sodium chloride infusion  10 mL/hr Intravenous Once Virgel Manifold, MD      . 0.9 %  sodium chloride infusion  250 mL Intravenous PRN Toy Baker, MD      . 0.9 %  sodium chloride infusion   Intravenous Once Shanon Brow Tat, MD      . 0.9 %  sodium chloride infusion   Intravenous Once [name], PA-C      . 0.9 %  sodium chloride infusion  1 mL/kg/hr Intravenous Continuous Leonie Man, MD 86.6 mL/hr at 11/26/14 1445 1 mL/kg/hr at 11/26/14 1445  . 0.9 %  sodium chloride infusion  250 mL Intravenous PRN Leonie Man, MD      . acetaminophen (TYLENOL) tablet 650 mg  650 mg Oral Q6H PRN Toy Baker, MD   650 mg at 11/25/14 1752   Or  . acetaminophen (TYLENOL) suppository 650 mg  650 mg Rectal Q6H PRN Toy Baker, MD      . bisacodyl (DULCOLAX) EC tablet 5 mg  5 mg Oral Once Sarah J Gribbin, PA-C      . carvedilol (COREG) tablet 12.5 mg  12.5 mg Oral BID WC Orson Eva, MD   12.5 mg at 11/26/14 1800  . Darbepoetin Alfa (ARANESP) injection 25 mcg  25 mcg Intravenous Q Wed-HD Foye Clock, PA-C      . docusate sodium  (COLACE) capsule 100 mg  100 mg Oral BID Toy Baker, MD   100 mg at 11/26/14 2133  . ferric gluconate (NULECIT) 125 mg in sodium chloride 0.9 % 100 mL IVPB  125 mg Intravenous Daily [name], MD   125 mg at 11/25/14 1753  . hydrALAZINE (APRESOLINE) tablet 50 mg  50 mg Oral 3 times per day Lelon Perla, MD   50 mg at 11/27/14 7782  . HYDROcodone-acetaminophen (NORCO/VICODIN) 5-325 MG per tablet 1-2 tablet  1-2 tablet Oral Q4H PRN Toy Baker, MD   1 tablet at 11/23/14 0550  . insulin aspart (novoLOG) injection 0-5 Units  0-5 Units Subcutaneous QHS Toy Baker, MD   0 Units at 11/23/14 0045  . insulin aspart (novoLOG) injection 0-9 Units  0-9 Units Subcutaneous TID WC Toy Baker, MD   3 Units at 11/26/14 1800  . isosorbide mononitrate (IMDUR) 24 hr tablet 30 mg  30 mg Oral Daily Deboraha Sprang, MD   30 mg at 11/25/14 1752  . labetalol (NORMODYNE,TRANDATE) injection 20 mg  20 mg Intravenous Q10 min PRN Leonie Man, MD   20 mg at 11/26/14 1106  . lisinopril (PRINIVIL,ZESTRIL) tablet 5 mg  5 mg Oral Daily Orson Eva, MD   5 mg at 11/25/14 1752  . morphine 2 MG/ML injection 2 mg  2 mg Intravenous Q1H PRN Leonie Man, MD      . multivitamin (RENA-VIT) tablet 1 tablet  1 tablet Oral QHS [name], PA-C   1 tablet at 11/26/14 2200  . ondansetron (ZOFRAN) tablet 4 mg  4 mg Oral Q6H PRN Toy Baker, MD       Or  . ondansetron (ZOFRAN) injection 4 mg  4 mg Intravenous Q6H PRN Toy Baker, MD      . pantoprazole (PROTONIX) EC tablet 40 mg  40 mg Oral Q1200 Toy Baker, MD   40 mg at 11/25/14 1048  . peg 3350 powder (MOVIPREP) kit 100 g  0.5 kit Oral Once Vena Rua, PA-C       And  . Derrill Memo ON 11/28/2014] peg 3350 powder (MOVIPREP) kit 100 g  0.5 kit Oral Once Vena Rua, PA-C      . sodium chloride 0.9 % injection 3 mL  3 mL Intravenous Q12H Toy Baker, MD   3 mL at 11/25/14 1049  . sodium chloride 0.9 % injection 3  mL  3 mL Intravenous Q12H Toy Baker, MD   3 mL at 11/25/14 2241  . sodium chloride 0.9 % injection 3 mL  3 mL Intravenous PRN Toy Baker, MD      . sodium chloride 0.9 % injection 3 mL  3 mL Intravenous Q12H Leonie Man, MD   3 mL at 11/26/14 2133  . sodium chloride 0.9 % injection 3 mL  3 mL Intravenous PRN Leonie Man, MD      . vancomycin (VANCOCIN) IVPB 1000 mg/200 mL premix  1,000 mg Intravenous To OR Ulyses Amor, PA-C        PE: General appearance: alert, cooperative and no distress Lungs: clear to auscultation bilaterally Heart: regular rate and rhythm Extremities: no LEE Pulses: 2+ and symmetric Skin: warm and dry Neurologic: Grossly normal  Lab Results:   Recent Labs  11/25/14 1320 11/26/14 0424 11/27/14 0442  WBC 8.8 7.7 10.1  HGB 7.8* 7.7* 7.9*  HCT 23.8* 23.8* 25.0*  PLT 232 248 274   BMET  Recent Labs  11/25/14 1320 11/26/14 0424  NA 135* 134*  K 4.2 4.2  CL 98 96  CO2 21 22  GLUCOSE 97 88  BUN 52* 35*  CREATININE 8.08* 5.97*  CALCIUM 7.5* 7.9*   PT/INR  Recent Labs  11/26/14 0810 11/27/14 0442  LABPROT 17.0* 16.7*  INR 1.37 1.33   Lipid Panel     Component Value Date/Time   CHOL 75 11/24/2014 0407   TRIG 49 11/24/2014 0407   HDL 22* 11/24/2014 0407   CHOLHDL 3.4 11/24/2014 0407   VLDL 10 11/24/2014 0407   LDLCALC 43 11/24/2014 0407     Studies/Results:  R/LHC 11/26/14 FINDINGS:  Hemodynamics:  Findings:   SaO2%  Pressures mmHg  Mean P  mmHg  EDP  mmHg   Right Atrium  post NTG  12/11   10   Right Ventricle   73/4   14   Pulmonary Artery  72  71/26  43    PCWP  pre-NTG Post-NTG 40/48  26/29  38 22    Central Aortic  96  161/79 Post-NTG  110    Left Ventricle  pre-NTG Post-NTG 172/10  160/_0 Mitral gradient  17.9/14.7                  Cardiac Output:   Cardiac Index:    Fick  11.17   5.29      Thermodilution  8.53   4.04     Coronary Anatomy: Right dominant  Left Main: Large-caliber vessel that bifurcates into the LAD and Circumflex. Angiographically normal. LAD & D1: Large-caliber wraparound vessel that bifurcates at the apex. It gives rise to a large caliber major first diagonal branch and large first septal perforator. Angiographically free of disease.  Left Circumflex: Large-caliber vessel that gives rise to a proximal large lateral OM 1 before going into the AV groove where it gives off a large left posterior lateral (LPL 1 with 2 smaller posterior lateral branches on either side). Angiographically normal.   RCA: Large-caliber, dominant vessel that has several RV marginal branches. Bifurcates distally into a moderate to large caliber PDA because two thirds the way to the apex. There is also smaller posterior lateral branch that gives off the AV nodal artery. Angiographically normal.  PATIENT DISPOSITION:   The patient was transferred to the PACU holding area in a hemodynamicaly stable, chest pain free condition.  The patient tolerated the procedure well, and there were no complications.  The patient was stable before, during, and after the procedure.  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 S/P LHC: Angiographically normal coronary arteries. Femoral access site stable.   2. Hypertensive/Non-ischemic cardiomyopathy: EF 20-25%. RHC suggestive of severe pulmonary HTN. Continue medical therapy. Currently Coreg, lisinopril, hydralazine and Imdur. Volume  status stable. Continue volume control through HD.   3. HTN: better controlled in the 1650Psystolic.   4. CKD: on HD M,W,F. Nephrology following  5. DM: on insulin. Managed by primary team.    LOS: 5 days   Brittainy M. SLadoris Gene12/01/2014 6:56 AM   I have personally seen and examined this patient with BLyda Jester PA-C. I agree with the assessment and plan as outlined above. Cardiac cath 11/26/14 with no angiographic evidence of CAD. His cardiomyopathy is non-ischemic. Continue medical management of his cardiomyopathy. He is going today for AV fistula today.   MCALHANY,CHRISTOPHER 11/27/2014 7:25 AM

## 2014-11-28 ENCOUNTER — Telehealth: Payer: Self-pay | Admitting: Vascular Surgery

## 2014-11-28 ENCOUNTER — Encounter (HOSPITAL_COMMUNITY)
Admission: EM | Disposition: A | Discharge status: Home / Self Care | Payer: Self-pay | Source: Home / Self Care | Attending: Internal Medicine

## 2014-11-28 ENCOUNTER — Encounter (HOSPITAL_COMMUNITY): Payer: Self-pay | Admitting: *Deleted

## 2014-11-28 HISTORY — PX: ESOPHAGOGASTRODUODENOSCOPY: SHX5428

## 2014-11-28 HISTORY — PX: COLONOSCOPY: SHX5424

## 2014-11-28 LAB — GLUCOSE, CAPILLARY
GLUCOSE-CAPILLARY: 193 mg/dL — AB (ref 70–99)
Glucose-Capillary: 117 mg/dL — ABNORMAL HIGH (ref 70–99)
Glucose-Capillary: 85 mg/dL (ref 70–99)
Glucose-Capillary: 97 mg/dL (ref 70–99)

## 2014-11-28 LAB — HEMOGLOBIN AND HEMATOCRIT, BLOOD
HCT: 24.1 % — ABNORMAL LOW (ref 39.0–52.0)
HEMOGLOBIN: 7.5 g/dL — AB (ref 13.0–17.0)

## 2014-11-28 SURGERY — COLONOSCOPY
Anesthesia: Moderate Sedation

## 2014-11-28 MED ORDER — FENTANYL CITRATE 0.05 MG/ML IJ SOLN
INTRAMUSCULAR | Status: AC
  Filled 2014-11-28: qty 2

## 2014-11-28 MED ORDER — CARVEDILOL 25 MG PO TABS
25.0000 mg | ORAL_TABLET | Freq: Two times a day (BID) | ORAL | Status: DC
  Administered 2014-11-28 – 2014-11-30 (×4): 25 mg via ORAL
  Filled 2014-11-28 (×6): qty 1

## 2014-11-28 MED ORDER — MIDAZOLAM HCL 10 MG/2ML IJ SOLN
INTRAMUSCULAR | Status: DC | PRN
Start: 2014-11-28 — End: 2014-11-28
  Administered 2014-11-28: 2 mg via INTRAVENOUS
  Administered 2014-11-28: 1 mg via INTRAVENOUS
  Administered 2014-11-28 (×2): 2 mg via INTRAVENOUS

## 2014-11-28 MED ORDER — SODIUM CHLORIDE 0.9 % IV SOLN
125.0000 mg | Freq: Every day | INTRAVENOUS | Status: AC
  Administered 2014-11-28 – 2014-11-29 (×2): 125 mg via INTRAVENOUS
  Filled 2014-11-28 (×5): qty 10

## 2014-11-28 MED ORDER — CALCIUM ACETATE 667 MG PO CAPS
1334.0000 mg | ORAL_CAPSULE | Freq: Three times a day (TID) | ORAL | Status: DC
  Administered 2014-11-28 – 2014-11-30 (×5): 1334 mg via ORAL
  Filled 2014-11-28 (×9): qty 2

## 2014-11-28 MED ORDER — FENTANYL CITRATE 0.05 MG/ML IJ SOLN
INTRAMUSCULAR | Status: DC | PRN
  Administered 2014-11-28 (×3): 25 ug via INTRAVENOUS

## 2014-11-28 MED ORDER — DARBEPOETIN ALFA 60 MCG/0.3ML IJ SOSY: 60.0000 ug | PREFILLED_SYRINGE | INTRAMUSCULAR | Status: DC

## 2014-11-28 MED ORDER — DIPHENHYDRAMINE HCL 50 MG/ML IJ SOLN
INTRAMUSCULAR | Status: AC
  Filled 2014-11-28: qty 1

## 2014-11-28 MED ORDER — BUTAMBEN-TETRACAINE-BENZOCAINE 2-2-14 % EX AERO
INHALATION_SPRAY | CUTANEOUS | Status: DC | PRN
  Administered 2014-11-28: 1 via TOPICAL

## 2014-11-28 MED ORDER — MIDAZOLAM HCL 5 MG/ML IJ SOLN
INTRAMUSCULAR | Status: AC
  Filled 2014-11-28: qty 2

## 2014-11-28 MED ORDER — DOXERCALCIFEROL 4 MCG/2ML IV SOLN
2.0000 ug | INTRAVENOUS | Status: DC
  Administered 2014-11-29: 2 ug via INTRAVENOUS
  Filled 2014-11-28: qty 2

## 2014-11-28 NOTE — Clinical Social Work Note (Signed)
CSW-Intern spoke with patient and wife [name], concerning transportation plans for dialysis after discharge. [name] plans to transport himself when is he able and feeling well. If he is unable to transport self, he stated that he would like to have family or medicaid transportation as a back up plan. His wife [name] was provided with information about medicaid transportation services. [name] was understanding and pleasant with speaking with CSW-Intern.    Gwenevere Abbot CSW-Intern

## 2014-11-28 NOTE — Progress Notes (Signed)
RN attempted X2 to show pt the hemodialysis videos and pt refused both times. Pt stated he will wait to watch them with his wife.  RN instructed pt on how to access the videos. Will continue to monitor.

## 2014-11-28 NOTE — Progress Notes (Signed)
          Daily Rounding Note  11/28/2014, 11:27 AM  LOS: 6 days   SUBJECTIVE:       Stools clear after uneventful split dose bowel prep.  Fells better  OBJECTIVE:         Vital signs in last 24 hours:    Temp:  [98.4 F (36.9 C)-100.3 F (37.9 C)] 100.3 F (37.9 C) (12/03 0540) Pulse Rate:  [79-89] 83 (12/03 0540) Resp:  [17-20] 19 (12/03 0540) BP: (131-186)/(60-87) 149/60 mmHg (12/03 0540) SpO2:  [97 %-99 %] 98 % (12/03 0540) Weight:  [190 lb 11.2 oz (86.5 kg)-194 lb 3.6 oz (88.1 kg)] 190 lb 11.2 oz (86.5 kg) (12/02 1706) Last BM Date: 11/28/14 Filed Weights   11/27/14 0500 11/27/14 1345 11/27/14 1706  Weight: 190 lb 14.7 oz (86.6 kg) 194 lb 3.6 oz (88.1 kg) 190 lb 11.2 oz (86.5 kg)   General: looks better, still not well appearing   Heart: RRR Chest: clear bil.   Abdomen: soft, NT, ND, active BS Extremities: no CCE Neuro/Psych:  Pleasant, relaxed.  Oriented x 3.   Intake/Output from previous day: 12/02 0701 - 12/03 0700 In: 1540 [P.O.:1240; I.V.:300] Out: 1741 [Urine:200; Stool:5]  Intake/Output this shift:    Lab Results:  Recent Labs  11/26/14 0424 11/27/14 0442 11/27/14 1433 11/28/14 0547  WBC 7.7 10.1 9.7  --   HGB 7.7* 7.9* 7.7* 7.5*  HCT 23.8* 25.0* 24.2* 24.1*  PLT 248 274 256  --    BMET  Recent Labs  11/25/14 1320 11/26/14 0424 11/27/14 1434  NA 135* 134* 136*  K 4.2 4.2 4.3  CL 98 96 98  CO2 21 22 21   GLUCOSE 97 88 143*  BUN 52* 35* 44*  CREATININE 8.08* 5.97* 7.87*  CALCIUM 7.5* 7.9* 8.1*   LFT  Recent Labs  11/25/14 1320 11/27/14 1434  ALBUMIN 2.7* 2.6*   PT/INR  Recent Labs  11/26/14 0810 11/27/14 0442  LABPROT 17.0* 16.7*  INR 1.37 1.33    ASSESMENT:    * Anemia. Acute on chronic. Microcytic. FOBT + but no melena or BPR. S/p PRBC x 4. Got Ferric gluconatex 3, doses plannned daily through 12/3. Aranesp also initiated.  Low iron, low iron sat, ferritin 15.     * Chest pain. Cardiac Troponins normal.  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 BNP 24,651. Cardiomyopathy, new diagnoses. EF 20 to 25%, restrictive left ventricle physiology, moderate-severe tricuspid regurge, pulmonary artery htn on echo.   * DM. Non-compliant with meds, no MD visit > 3 years.   * Acute renal failure, hx diabetic nephropathy and CKD. Now with ESRD and initiating on HD: 11/28, 11/29, 11/30. Renal function improved. .  Likely renal disease contributing to anemia.     PLAN   *  EGD/colonoscopy this afternoon.     [name]  11/28/2014, 11:27 AM Pager: [phone]    Attending physician's note   I have taken an interval history, reviewed the chart and examined the patient. I agree with the Advanced Practitioner's note, impression and recommendations.   Pricilla Riffle. Fuller Plan, MD [facility]

## 2014-11-28 NOTE — Telephone Encounter (Signed)
LM for pt re appt, dpm °

## 2014-11-28 NOTE — Progress Notes (Addendum)
  Postoperative hemodialysis access     Date of Surgery:  11/27/14 Surgeon: Bridgett Larsson  Subjective:  Pt in restroom preparing for colonoscopy-spoke to family member.  He is not complaining of any pain in his left arm or hand.  PHYSICAL EXAMINATION:  Filed Vitals:   11/28/14 0540  BP: 149/60  Pulse: 83  Temp: 100.3 F (37.9 C)  Resp: 19    Unable to assess at this time as pt is in bathroom.   ASSESSMENT/PLAN:  [name] is a 63 y.o. year old male who is s/p left brachiocephalic AVF  -per pt family member, pt is doing well and not reporting any type of steal symptoms -family member also reports the fistula was checked by renal this morning and ws patent. -will check back this afternoon to see pt -f/u with Dr. Bridgett Larsson in 6 weeks to check maturation of fistula.   Leontine Locket, PA-C [facility] [phone]   Addendum:  Returned to see pt this afternoon.  He has a thrill within the fistula.  He denies any pain or numbness in his left hand.  His grip is strong and sensation is in tact.  [name], [name] 11/28/2014 1:48 PM

## 2014-11-28 NOTE — Op Note (Addendum)
[facility] [place], [address]   COLONOSCOPY PROCEDURE REPORT  PATIENT: [name], [name]  MR#: [id number] BIRTHDATE: [birth year] , 63  yrs. old GENDER: male ENDOSCOPIST: Ladene Artist, MD, [facility] REFERRED [id number] Hospitalists PROCEDURE DATE:  11/28/2014 PROCEDURE:   Colonoscopy, diagnostic First Screening Colonoscopy - Avg.  risk and is 50 yrs.  old or older - No.  Prior Negative Screening - Now for repeat screening. N/A  History of Adenoma - Now for follow-up colonoscopy & has been > or = to 3 yrs.  N/A  Polyps Removed Today? No.  Polyps Removed Today? No.  Recommend repeat exam, <10 yrs? Polyps Removed Today? No.  Recommend repeat exam, <10 yrs? No. ASA CLASS:   Class III INDICATIONS:iron deficiency anemia and occult blood . MEDICATIONS: Fentanyl 75 mcg IV and Versed 7 mg IV DESCRIPTION OF PROCEDURE:   After the risks benefits and alternatives of the procedure were thoroughly explained, informed consent was obtained.  The digital rectal exam revealed no abnormalities of the rectum.   The Pentax Ped Colon [id number] endoscope was introduced through the anus and advanced to the cecum, which was identified by both the appendix and ileocecal valve. No adverse events experienced.   The quality of the prep was good, using MoviPrep  The instrument was then slowly withdrawn as the colon was fully examined.  COLON FINDINGS: A normal appearing cecum, ileocecal valve, and appendiceal orifice were identified.  The ascending, transverse, descending, sigmoid colon, and rectum appeared unremarkable. Retroflexed views revealed internal Grade I hemorrhoids. The time to cecum=4 minutes 00 seconds.  Withdrawal time=8 minutes 00 seconds.  The scope was withdrawn and the procedure completed. COMPLICATIONS: There were no immediate complications.  ENDOSCOPIC IMPRESSION: 1.  Normal colonoscopy 2.  Grade I internal hemorrhoids  RECOMMENDATIONS: 1.  Continue  to follow colorectal cancer screening guidelines for "routine risk" patients with a repeat colonoscopy in 10 years with Dr. Deatra Ina.  There is no need for routine, screening for FOBT (stool) testing for at least 5 years. 2.  Upper endoscopy today 3.  GI follow up with Dr. Deatra Ina prn  eSigned:  Ladene Artist, MD, [facility] 11/28/2014 3:26 PM Revised: 11/28/2014 3:26 PM

## 2014-11-28 NOTE — Telephone Encounter (Addendum)
-----   Message from Mena Goes, RN sent at 11/27/2014  9:58 AM EST ----- Regarding: Schedule   ----- Message -----    From: Conrad Leeds, MD    Sent: 11/27/2014   9:51 AM      To: [facility] [address]  DELONTAY MULANAX [id number] 1951/09/17  PROCEDURE: left brachiocephalic arteriovenous fistula placement  Asst: Silva Bandy, [facility]   Follow-up: 6 weeks  11/28/14: lm for pt re appt, dpm

## 2014-11-28 NOTE — Progress Notes (Signed)
Patient Demographics  [name], is a 63 y.o. male, DOB - [birth year], [id number]  Admit date - 11/22/2014   Admitting Physician Toy Baker, MD  Outpatient Primary MD for the patient is [name], MD  LOS - 6   Chief Complaint  Patient presents with  . Chest Pain  . Shortness of Breath       Brief history  63 year old male with history of diabetes mellitus, CKD, iron deficiency anemia, hypertension presented with 1 month history of chest discomfort and shortness of breath. The patient has not seen a physician for nearly 2 years. Upon presentation, he was found to have a serum creatinine of 13.72 and hemoglobin of 5.3. The patient stated that he has only been taking his 70/30 insulin on a when necessary basis when his CBGs are greater than 150. He states that he is only been checking his CBGs once daily. The patient stated that he has been having increasing dyspnea on exertion with intermittent chest discomfort. He denies any NSAID use. He denies any hematemesis, hematochezia, melena, hematuria. The patient states that he has not taken any antihypertensive medications for at least 6 months. Workup revealed a positive FOBT. Gastroenterology was consulted and continues to follow the patient. Echocardiogram was performed and revealed EF 20-25% with severe pulmonary artery hypertension. As a result, cardiology was consulted and feels that the patient needs a right and left heart catheterization--scheduled 11/26/14. nephrology was consulted, and the temporary dialysis catheter was placed and the patient was initiated on dialysis on 11/23/2014.Gastroenterology plans upper and lower endoscopy.    Subjective:   [name] today has, No headache, No chest pain, No abdominal pain - No  Nausea, No new weakness tingling or numbness, No Cough - SOB.    Assessment & Plan    1. Acute on chronic renal failure now ESRD. Been dialyzed through a right IJ dialysis catheter placed 11/23/2014, left arm AV fistula placed by vascular surgery on 11/27/2014. Clip process likely will be done today. Likely discharge 11/29/2014    2. Symptomatic anemia of chronic disease along with iron deficiency anemia. GI following will require upper and lower endoscopy later today. He status post 4 units of packed RBC transfusion this admission, H&H stable, also getting IV iron per renal.    3. 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 Cardiology following, symptomatically improved after fluid removal through dialysis, status post right and left heart cath shows normal coronaries and confirms severe pulmonary hypertension. Currently on combination of Coreg, hydralazine, Imdur, lisinopril along with dialysis for fluid removal. Continue to monitor. Further workup per cardiology were following the patient.    4. SVT/atrial tachycardia. Symptom-free cardiology following, telemetry, continue Coreg.    5. DM type II. Noncompliant with home regimen which was 7030, A1c 5.7. Currently on sliding scale.    6. Essential hypertension in poor control. Currently stable on a combination of Coreg, lisinopril, hydralazine along with Imdur. Follow closely.     Code Status: Full  Family Communication: Wife bedside  Disposition Plan: Home likely 11/29/2014   Procedures    L arm AV fistula 11-27-14 Dr Mariane Baumgarten Dialysis catheter IR 11-23-14   EGD and colonoscopy pending likely 11/28/2014   TTE  -  Left ventricle: The cavity size was at the upper limits of normal. Wall thickness was increased in a pattern of moderateLVH. Systolic function was severely reduced. The estimatedejection fraction was in the range of 20% to 25%. Diffusehypokinesis. Doppler  parameters are consistent with restrictivephysiology, indicative of decreased left ventricular diastolic compliance and/or increased left atrial pressure. - Left atrium: The atrium was moderately dilated. - Right ventricle: Systolic function was moderately reduced. - Right atrium: The atrium was mildly dilated. - Tricuspid valve: There was moderate-severe regurgitation. - Pulmonary arteries: Systolic pressure was severely increased. PA peak pressure: 98 mm Hg (S).  Mitral valve:  Structurally normal valve.  Leaflet separation was normal. Doppler: Transvalvular velocity was within the normal range. There was no evidence for stenosis. There was trivial regurgitation.  Peak gradient (D): 7 mm Hg.   Left and right heart cath 11/26/2014. 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 sodium chloride   Intravenous Once  . sodium chloride   Intravenous Once  . bisacodyl  5 mg Oral Once  . calcium acetate  1,334 mg Oral TID WC  . carvedilol  25 mg Oral BID WC  . [START ON 12/04/2014] darbepoetin (ARANESP) injection - DIALYSIS  60 mcg Intravenous Q Wed-HD  . docusate sodium  100 mg Oral BID  . [START ON 11/29/2014] doxercalciferol  2 mcg Intravenous Q M,W,F-HD  . hydrALAZINE  50 mg Oral 3 times per day  . insulin aspart  0-5 Units Subcutaneous QHS  . insulin aspart  0-9 Units Subcutaneous TID WC  . isosorbide mononitrate  30 mg Oral Daily  . lisinopril  5 mg Oral Daily  . multivitamin  1 tablet Oral QHS  . pantoprazole  40 mg Oral Q1200  . sodium chloride  3 mL Intravenous Q12H  . sodium chloride  3 mL Intravenous Q12H  . sodium chloride  3 mL  Intravenous Q12H   Continuous Infusions: . sodium chloride    . sodium chloride 1 mL/kg/hr (11/26/14 1445)  . sodium chloride     PRN Meds:.sodium chloride, sodium chloride, acetaminophen **OR** acetaminophen, HYDROcodone-acetaminophen, labetalol, morphine injection, ondansetron **OR** ondansetron (ZOFRAN) IV, sodium chloride, sodium chloride  DVT Prophylaxis   SCD due to GI bleed    Lab Results  Component Value Date   PLT 256 11/27/2014    Antibiotics     Anti-infectives    Start     Dose/Rate Route Frequency Ordered Stop   11/27/14 0000  [MAR Hold]  vancomycin (VANCOCIN) IVPB 1000 mg/200 mL premix     (MAR Hold since 11/27/14 0732)   1,000 mg200 mL/hr over 60 Minutes Intravenous To Surgery 11/25/14 1606 11/27/14 0910   11/23/14 1200  vancomycin (VANCOCIN) IVPB 1000 mg/200 mL premix     1,000 mg200 mL/hr over 60 Minutes Intravenous  Once 11/23/14 1148 11/23/14 1300          Objective:   Filed Vitals:   11/27/14 1706 11/27/14 1750 11/27/14 2039 11/28/14 0540  BP:  151/62 151/66 149/60  Pulse:  86 87 83  Temp:  98.7 F (37.1 C) 99.8 F (37.7 C) 100.3 F (37.9 C)  TempSrc:   Oral Oral  Resp:  17  18 19  Height:      Weight: 86.5 kg (190 lb 11.2 oz)     SpO2:  98% 97% 98%    Wt Readings from Last 3 Encounters:  11/27/14 86.5 kg (190 lb 11.2 oz)  01/28/12 102.059 kg (225 lb)     Intake/Output Summary (Last 24 hours) at 11/28/14 1119 Last data filed at 11/28/14 0658  Gross per 24 hour  Intake   1240 ml  Output   1741 ml  Net   -501 ml     Physical Exam  Awake Alert, Oriented X 3, No new F.N deficits, Normal affect [place].AT,PERRAL Supple Neck,No JVD, No cervical lymphadenopathy appriciated.  Symmetrical Chest wall movement, Good air movement bilaterally, CTAB RRR,No Gallops,Rubs or new Murmurs, No Parasternal Heave +ve B.Sounds, Abd Soft, No tenderness, No organomegaly appriciated, No rebound - guarding or rigidity. No Cyanosis, Clubbing or edema, No new  Rash or bruise    Right IJ dialysis catheter in place, left arm dialysis AV fistula site stable  Data Review   Micro Results Recent Results (from the past 240 hour(s))  Urine culture     Status: None   Collection Time: 11/22/14  5:40 PM  Result Value Ref Range Status   Specimen Description URINE, RANDOM  Final   Special Requests NONE  Final   Culture  Setup Time   Final    11/22/2014 21:38 Performed at [place] Performed at [facility]   Final   Culture NO GROWTH Performed at [facility]   Final   Report Status 11/23/2014 FINAL  Final  Surgical pcr screen     Status: None   Collection Time: 11/26/14  6:59 AM  Result Value Ref Range Status   MRSA, PCR NEGATIVE NEGATIVE Final   Staphylococcus aureus NEGATIVE NEGATIVE Final    Comment:        The Xpert SA Assay (FDA approved for NASAL specimens in patients over 34 years of age), is one component of a comprehensive surveillance program.  Test performance has been validated by [employer] for patients greater than or equal to 64 year old. It is not intended to diagnose infection nor to guide or monitor treatment.     Radiology Reports Dg Chest 2 View (if Patient Has Fever And/or Copd)  11/22/2014   CLINICAL DATA:  Sternal chest pain and dizziness for 2 days, shortness of breath for a month, sore throat since last week, chills since this summer, personal history of hypertension, diabetes, former smoker  EXAM: CHEST  2 VIEW  COMPARISON:  01/28/2012  FINDINGS: Enlargement of cardiac silhouette with borderline vascular congestion.  Mediastinal contours normal.  Lungs clear.  No infiltrate, pleural effusion or pneumothorax.  Mild biconvex thoracolumbar scoliosis without acute osseous findings.  IMPRESSION: Enlargement of cardiac silhouette.  No acute abnormalities.   Electronically Signed   By: Lavonia Dana M.D.   On: 11/22/2014 16:09   Ct Head Wo Contrast  11/23/2014    CLINICAL DATA:  63 year old male with intermittent visual changes  EXAM: CT HEAD WITHOUT CONTRAST  TECHNIQUE: Contiguous axial images were obtained from the base of the skull through the vertex without intravenous contrast.  COMPARISON:  None  FINDINGS: Negative for acute intracranial hemorrhage, acute infarction, mass, mass effect, hydrocephalus or midline shift. Gray-white differentiation is preserved throughout. Very mild atrophy. Mild periventricular and subcortical white matter hypoattenuation most consistent with the sequelae of longstanding microvascular ischemic white matter  disease. No focal soft tissue or calvarial abnormality. No focal global abnormality. The orbits are symmetric. Normal aeration of the mastoid air cells and visualized paranasal sinuses. Trace atherosclerotic calcification in the bilateral cavernous carotid arteries.  IMPRESSION: 1. No acute intracranial abnormality. 2. 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 3. Intracranial atherosclerosis.   Electronically Signed   By: Jacqulynn Cadet M.D.   On: 11/23/2014 03:08   US Renal  11/23/2014   CLINICAL DATA:  Renal failure  EXAM: RENAL/URINARY TRACT ULTRASOUND COMPLETE  COMPARISON:  None.  FINDINGS: Right Kidney:  Length: 10.0 cm.  5 mm interpolar calculus.  No hydronephrosis.  Left Kidney:  Length: 9.7 cm.  No mass or hydronephrosis.  Bladder:  Within normal limits.  IMPRESSION: 5 mm interpolar right renal calculus.  No hydronephrosis.   Electronically Signed   By: Julian Hy M.D.   On: 11/23/2014 12:40      Ir US Guide Vasc Access Right  11/23/2014   CLINICAL DATA:  Progressive renal insufficiency, needs long-term access for hemodialysis  EXAM: TUNNELED HEMODIALYSIS CATHETER PLACEMENT WITH ULTRASOUND AND FLUOROSCOPIC GUIDANCE  TECHNIQUE: The procedure, risks, benefits, and alternatives were explained to the patient. Questions regarding the procedure were encouraged and answered. The patient  understands and consents to the procedure. As antibiotic prophylaxis, vancomycin 1 g was ordered pre-procedure and administered intravenously within one hour of incision.Patency of the right IJ vein was confirmed with ultrasound with image documentation. An appropriate skin site was determined. Region was prepped using maximum barrier technique including cap and mask, sterile gown, sterile gloves, large sterile sheet, and Chlorhexidine as cutaneous antisepsis. The region was infiltrated locally with 1% lidocaine.  Intravenous Fentanyl and Versed were administered as conscious sedation during continuous cardiorespiratory monitoring by the radiology RN, with a total moderate sedation time of 11 minutes.  Under real-time ultrasound guidance, the right IJ vein was accessed with a 21 gauge micropuncture needle; the needle tip within the vein was confirmed with ultrasound image documentation. Needle exchanged over the 018 guidewire for transitional dilator, which allowed advancement of a Benson wire into the IVC. Over this, an MPA catheter was advanced. A Hemosplit 19 hemodialysis catheter was tunneled from the right anterior chest wall approach to the right IJ dermatotomy site. The MPA catheter was exchanged over an Amplatz wire for serial vascular dilators which allow placement of a peel-away sheath, through which the catheter was advanced under intermittent fluoroscopy, positioned with its tips in the proximal and midright atrium. Spot chest radiograph confirms good catheter position. No pneumothorax. Catheter was flushed and primed per protocol. Catheter secured externally with O Prolene sutures. The right IJ dermatotomy site was closed with Dermabond.  COMPLICATIONS: COMPLICATIONS None immediate  FLUOROSCOPY TIME:  12 seconds  COMPARISON:  None  IMPRESSION: 1. Technically successful placement of tunneled right IJ hemodialysis catheter with ultrasound and fluoroscopic guidance. Ready for routine use.  ACCESS: Remains  approachable for percutaneous intervention as needed.   Electronically Signed   By: Arne Cleveland M.D.   On: 11/23/2014 14:25     CBC  Recent Labs Lab 11/24/14 0407 11/25/14 1320 11/26/14 0424 11/27/14 0442 11/27/14 1433 11/28/14 0547  WBC 8.4 8.8 7.7 10.1 9.7  --   HGB 7.5* 7.8* 7.7* 7.9* 7.7* 7.5*  HCT 22.7* 23.8* 23.8* 25.0* 24.2* 24.1*  PLT 286 232 248 274 256  --   MCV 79.1 82.4 83.5 82.0 82.9  --   MCH 26.1 27.0 27.0 25.9* 26.4  --  MCHC 33.0 32.8 32.4 31.6 31.8  --   RDW 15.8* 16.3* 16.3* 17.0* 16.9*  --     Chemistries   Recent Labs Lab 11/23/14 1355 11/24/14 0407 11/25/14 1320 11/26/14 0424 11/27/14 1434  NA 137 136* 135* 134* 136*  K 4.9 4.5 4.2 4.2 4.3  CL 103 101 98 96 98  CO2 13* 18* 21 22 21   GLUCOSE 111* 97 97 88 143*  BUN 101* 67* 52* 35* 44*  CREATININE 13.04* 9.76* 8.08* 5.97* 7.87*  CALCIUM 8.0* 7.6* 7.5* 7.9* 8.1*   ------------------------------------------------------------------------------------------------------------------ estimated creatinine clearance is 10.9 mL/min (by C-G formula based on Cr of 7.87). ------------------------------------------------------------------------------------------------------------------ No results for input(s): HGBA1C in the last 72 hours. ------------------------------------------------------------------------------------------------------------------ No results for input(s): CHOL, HDL, LDLCALC, TRIG, CHOLHDL, LDLDIRECT in the last 72 hours. ------------------------------------------------------------------------------------------------------------------ No results for input(s): TSH, T4TOTAL, T3FREE, THYROIDAB in the last 72 hours.  Invalid input(s): FREET3 ------------------------------------------------------------------------------------------------------------------ No results for input(s): VITAMINB12, FOLATE, FERRITIN, TIBC, IRON, RETICCTPCT in the last 72 hours.  Coagulation profile  Recent  Labs Lab 11/26/14 0810 11/27/14 0442  INR 1.37 1.33    No results for input(s): DDIMER in the last 72 hours.  Cardiac Enzymes  Recent Labs Lab 11/22/14 1704 11/22/14 2205 11/23/14 1355  TROPONINI <0.30 <0.30 <0.30   ------------------------------------------------------------------------------------------------------------------ Invalid input(s): POCBNP     Time Spent in minutes   35   Jameek Bruntz K M.D on 11/28/2014 at 11:19 AM  Between 7am to 7pm - Pager - [phone]  After 7pm go to [email or web address] - password [email or web address]  And look for the night coverage person covering for me after hours  [facility] Office  [phone]

## 2014-11-28 NOTE — Progress Notes (Signed)
Subjective:  Npo for GI eval heme pos stool/ HD yest some cramping at end/OP schedule sec shift MWF at [facility].  Objective Vital signs in last 24 hours: Filed Vitals:   11/27/14 1706 11/27/14 1750 11/27/14 2039 11/28/14 0540  BP:  151/62 151/66 149/60  Pulse:  86 87 83  Temp:  98.7 F (37.1 C) 99.8 F (37.7 C) 100.3 F (37.9 C)  TempSrc:   Oral Oral  Resp:  17 18 19   Height:      Weight: 86.5 kg (190 lb 11.2 oz)     SpO2:  98% 97% 98%   Weight change: 1.5 kg (3 lb 4.9 oz) Physical Exam: General:NAD, Alert , appropriate OX 4  Heart: RRR 2/6 sem lsb, No gallop , or rub Lungs: CTA Bilat . nonlabored Abdomen: Soft NT. ND Extremities: no pedal edema Dialysis Access: R ij Perm cath / L UA AVF pos bruit   Problem/Plan 1. CKD 5 / NEW ESRD - improved with HD. New IJ cath and new AVF LUA 12/2 by Dr Bridgett Larsson.  Clipped  To [facility]  Financial counselor to see today for Medicaid application. For fourth HD today  2. Anemia - Hgb 5.3 admit up to 7.7> 7.9.7.5/ + Fe deficiency. s/p PRBC Replete iron. Started Aranesp 25. AND Hemoccult stools Pos.  with Gi Seeing 3. 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 HTN - bp 149/60 on Meds/volume control with HD - cramping end of hd yest appears at edw now and on meds per Card. 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 DM - no meds / Diet control now / A1C 5.7 8.. Dyspnea - multifactorial=  Now resolved.likely related to his severe anemia and volume issues and CM/ 9. H/o toe amputation in the past 10. Nutrition - alb 2.6  /anorexia resolved after HD txs - needs education = Wife Given NKF website info   Ernest Haber, PA-C [facility] Beeper [phone] 11/28/2014,8:12 AM  LOS: 6 days   Pt seen, examined and agree w A/P as above.  Kelly Splinter MD pager [phone]    cell [phone] 11/28/2014, 12:26 PM    Labs: Basic Metabolic Panel:  Recent Labs Lab 11/24/14 0407 11/25/14 1320 11/26/14 0424 11/27/14 1434  NA 136* 135* 134* 136*  K 4.5 4.2 4.2 4.3  CL 101 98 96 98  CO2 18* 21 22 21   GLUCOSE 97 97 88 143*  BUN 67* 52* 35* 44*  CREATININE 9.76* 8.08* 5.97* 7.87*  CALCIUM 7.6* 7.5* 7.9* 8.1*  PHOS 4.8* 4.8*  --  6.3*   Liver Function Tests:  Recent Labs Lab 11/24/14 0407 11/25/14 1320 11/27/14 1434  ALBUMIN 2.7* 2.7* 2.6*  CBC:  Recent Labs Lab 11/24/14 0407 11/25/14 1320 11/26/14 0424 11/27/14 0442 11/27/14 1433 11/28/14 0547  WBC 8.4 8.8 7.7 10.1 9.7  --   HGB 7.5* 7.8* 7.7* 7.9* 7.7* 7.5*  HCT 22.7* 23.8* 23.8* 25.0* 24.2* 24.1*  MCV 79.1 82.4 83.5 82.0 82.9  --   PLT 286 232 248 274 256  --  Cardiac Enzymes:  Recent Labs Lab 11/22/14 1704 11/22/14 2205 11/23/14 1355  TROPONINI <0.30 <0.30 <0.30   CBG:  Recent Labs Lab 11/27/14 0623 11/27/14 1018 11/27/14 1145 11/27/14 1751 11/27/14 2034  GLUCAP 109* 113* 117* 119* 229*    Studies/Results: No results found. Medications: . sodium chloride    . sodium chloride 1 mL/kg/hr (11/26/14 1445)  . sodium chloride     . sodium chloride   Intravenous Once  . sodium chloride   Intravenous Once  . bisacodyl  5 mg Oral Once  . carvedilol  12.5 mg Oral BID WC  . darbepoetin (ARANESP) injection - DIALYSIS  25 mcg Intravenous Q Wed-HD  . docusate sodium  100 mg Oral BID  . [MAR Hold] ferric gluconate (FERRLECIT/NULECIT) IV  125 mg Intravenous Daily  . hydrALAZINE  50 mg Oral 3 times per day  . insulin aspart  0-5 Units Subcutaneous QHS  . insulin aspart  0-9 Units Subcutaneous TID WC  .  isosorbide mononitrate  30 mg Oral Daily  . lisinopril  5 mg Oral Daily  . multivitamin  1 tablet Oral QHS  . pantoprazole  40 mg Oral Q1200  . sodium chloride  3 mL Intravenous Q12H  . sodium chloride  3 mL Intravenous Q12H  . sodium chloride  3 mL Intravenous Q12H

## 2014-11-28 NOTE — Op Note (Addendum)
[facility] [place], [address]   ENDOSCOPY PROCEDURE REPORT  PATIENT: [name], [name]  MR#: [id number] BIRTHDATE: February 15, 1951 , 63  yrs. old GENDER: male ENDOSCOPIST: Ladene Artist, MD, [facility] REFERRED BY:  [facility] PROCEDURE DATE:  11/28/2014 PROCEDURE:  EGD w/ biopsy ASA CLASS:     Class III INDICATIONS:  iron deficiency anemia and occult blood positive. MEDICATIONS: Residual sedation present TOPICAL ANESTHETIC: none DESCRIPTION OF PROCEDURE: After the risks benefits and alternatives of the procedure were thoroughly explained, informed consent was obtained.  The Pentax Gastroscope [id number] endoscope was introduced through the mouth and advanced to the second portion of the duodenum , Without limitations.  The instrument was slowly withdrawn as the mucosa was fully examined.    STOMACH: Congested, friable, non erosive, moderate gastritis was found in the gastric fundus and gastric body.  Multiple biopsies were performed.   The stomach otherwise appeared normal. ESOPHAGUS: The mucosa of the esophagus appeared normal. DUODENUM: The duodenal mucosa showed no abnormalities.  Retroflexed views revealed no abnormalities.     The scope was then withdrawn from the patient and the procedure completed.  COMPLICATIONS: There were no immediate complications.  ENDOSCOPIC IMPRESSION: 1.   Gastritis in the gastric fundus and gastric body; multiple biopsies performed 2.   The EGD otherwise appeared normal  RECOMMENDATIONS: 1.  Await pathology results 2.  Continue PPI daily and Fe replacement 3.  Follow up with Dr. Deatra Ina prn  eSigned:  Ladene Artist, MD, [facility] 11/28/2014 3:27 PM Revised: 11/28/2014 3:27 PM

## 2014-11-28 NOTE — H&P (View-Only) (Signed)
Consult for [facility]  Reason for Consult: Anemia and Heme positive stool Referring Physician: [facility]  [name] HPI: This is a 63 year old male admitted for chest pain and SOB.  He has a history of medical noncompliance with his DM and HTN.  He states that he has had sharp chest pain and worsening SOB over the past month.  As a result of his symptoms he presented to the ER for further evaluation.  He was noted to have an HGB of 5.3 g/dL and a creatinine of 13.  His last blood work was in in 2013 reported a creatinine of 1.6, but it did fluctuate into the 2 range.  Additionally, his HGB averaged in the 8-9 range, but it was as high as 12.0 in 2009.  The urinalysis did reveal hematuria and he was noted to be heme positive with brown stool.  No prior EGD/Colonoscopy and there is no family history of colon cancer.  The patient denies any routine NSAID use.  Past Medical History  Diagnosis Date  . Diabetes mellitus   . HTN (hypertension)   . Osteomyelitis     Past Surgical History  Procedure Laterality Date  . Left 4th toe amputation    . Cholecystectomy    . Tonsillectomy    . I&d extremity  01/28/2012    Procedure: IRRIGATION AND DEBRIDEMENT EXTREMITY;  Surgeon: Paulene Floor, MD;  Location: [facility];  Service: Orthopedics;  Laterality: Left;  irrigation and drainage left hand  . I&d extremity  01/30/2012    Procedure: IRRIGATION AND DEBRIDEMENT EXTREMITY;  Surgeon: Paulene Floor, MD;  Location: [facility];  Service: Orthopedics;  Laterality: Left;  flexor teno synovectomy and extensor tenosynovectomy arthrosynovectomy wristjoint  . I&d extremity  02/01/2012    Procedure: IRRIGATION AND DEBRIDEMENT EXTREMITY;  Surgeon: Paulene Floor, MD;  Location: [facility];  Service: Orthopedics;  Laterality: Left;  Left Wrist, Hand and Forearm    Family History  Problem Relation Age of Onset  . Heart disease Mother   . Diabetes Father   . Diabetes Brother   . Diabetes Sister    . Diabetes Brother   . Diabetes Brother     Social History:  reports that he has quit smoking. His smoking use included Cigarettes. He has a 2 pack-year smoking history. He has never used smokeless tobacco. He reports that he does not drink alcohol or use illicit drugs.  Allergies:  Allergies  Allergen Reactions  . Penicillins Hives and Swelling    Medications:  Scheduled: . sodium chloride  10 mL/hr Intravenous Once  . amLODipine  10 mg Oral Daily  . docusate sodium  100 mg Oral BID  . ferric gluconate (FERRLECIT/NULECIT) IV  125 mg Intravenous Daily  . insulin aspart  0-5 Units Subcutaneous QHS  . insulin aspart  0-9 Units Subcutaneous TID WC  . pantoprazole  40 mg Oral Q1200  . sodium chloride  3 mL Intravenous Q12H  . sodium chloride  3 mL Intravenous Q12H   Continuous:   Results for orders placed or performed during the hospital encounter of 11/22/14 (from the past 24 hour(s))  CBG monitoring, ED     Status: Abnormal   Collection Time: 11/22/14  3:07 PM  Result Value Ref Range   Glucose-Capillary 129 (H) 70 - 99 mg/dL  Basic metabolic panel     Status: Abnormal   Collection Time: 11/22/14  3:27 PM  Result Value Ref Range  Sodium 136 (L) 137 - 147 mEq/L   Potassium 5.5 (H) 3.7 - 5.3 mEq/L   Chloride 103 96 - 112 mEq/L   CO2 11 (L) 19 - 32 mEq/L   Glucose, Bld 142 (H) 70 - 99 mg/dL   BUN 107 (H) 6 - 23 mg/dL   Creatinine, Ser 13.66 (H) 0.50 - 1.35 mg/dL   Calcium 8.6 8.4 - 10.5 mg/dL   GFR calc non Af Amer 3 (L) >90 mL/min   GFR calc Af Amer 4 (L) >90 mL/min   Anion gap 22 (H) 5 - 15  CBC     Status: Abnormal   Collection Time: 11/22/14  3:27 PM  Result Value Ref Range   WBC 9.1 4.0 - 10.5 K/uL   RBC 2.06 (L) 4.22 - 5.81 MIL/uL   Hemoglobin 5.3 (LL) 13.0 - 17.0 g/dL   HCT 16.4 (L) 39.0 - 52.0 %   MCV 79.6 78.0 - 100.0 fL   MCH 25.7 (L) 26.0 - 34.0 pg   MCHC 32.3 30.0 - 36.0 g/dL   RDW 17.6 (H) 11.5 - 15.5 %   Platelets 373 150 - 400 K/uL  I-stat  troponin, ED (not at [facility])     Status: None   Collection Time: 11/22/14  3:50 PM  Result Value Ref Range   Troponin i, poc 0.05 0.00 - 0.08 ng/mL   Comment 3          Antistreptolysin O titer     Status: None   Collection Time: 11/22/14  4:40 PM  Result Value Ref Range   ASO 75 <409 IU/mL  Vitamin B12     Status: None   Collection Time: 11/22/14  5:04 PM  Result Value Ref Range   Vitamin B-12 713 211 - 911 pg/mL  Folate     Status: None   Collection Time: 11/22/14  5:04 PM  Result Value Ref Range   Folate 12.8 ng/mL  Iron and TIBC     Status: Abnormal   Collection Time: 11/22/14  5:04 PM  Result Value Ref Range   Iron 16 (L) 42 - 135 ug/dL   TIBC 253 215 - 435 ug/dL   Saturation Ratios 6 (L) 20 - 55 %   UIBC 237 125 - 400 ug/dL  Ferritin     Status: None   Collection Time: 11/22/14  5:04 PM  Result Value Ref Range   Ferritin 45 22 - 322 ng/mL  Reticulocytes     Status: Abnormal   Collection Time: 11/22/14  5:04 PM  Result Value Ref Range   Retic Ct Pct 2.5 0.4 - 3.1 %   RBC. 1.89 (L) 4.22 - 5.81 MIL/uL   Retic Count, Manual 47.3 19.0 - 186.0 K/uL  Type and screen     Status: None (Preliminary result)   Collection Time: 11/22/14  5:04 PM  Result Value Ref Range   ABO/RH(D) O POS    Antibody Screen NEG    Sample Expiration 11/25/2014    Unit Number [id number]    Blood Component Type RED CELLS,LR    Unit division 00    Status of Unit ISSUED    Transfusion Status OK TO TRANSFUSE    Crossmatch Result Compatible    Unit Number [id number]    Blood Component Type RED CELLS,LR    Unit division 00    Status of Unit ALLOCATED    Transfusion Status OK TO TRANSFUSE    Crossmatch Result Compatible   Save smear  Status: None   Collection Time: 11/22/14  5:04 PM  Result Value Ref Range   Smear Review SMEAR STAINED AND AVAILABLE FOR REVIEW   Troponin I     Status: None   Collection Time: 11/22/14  5:04 PM  Result Value Ref Range   Troponin I <0.30 <0.30 ng/mL  Pro b  natriuretic peptide     Status: Abnormal   Collection Time: 11/22/14  5:09 PM  Result Value Ref Range   Pro B Natriuretic peptide (BNP) 24651.0 (H) 0 - 125 pg/mL  Urinalysis with microscopic     Status: Abnormal   Collection Time: 11/22/14  5:40 PM  Result Value Ref Range   Color, Urine YELLOW YELLOW   APPearance CLEAR CLEAR   Specific Gravity, Urine 1.011 1.005 - 1.030   pH 5.5 5.0 - 8.0   Glucose, UA 100 (A) NEGATIVE mg/dL   Hgb urine dipstick MODERATE (A) NEGATIVE   Bilirubin Urine NEGATIVE NEGATIVE   Ketones, ur NEGATIVE NEGATIVE mg/dL   Protein, ur 100 (A) NEGATIVE mg/dL   Urobilinogen, UA 0.2 0.0 - 1.0 mg/dL   Nitrite NEGATIVE NEGATIVE   Leukocytes, UA NEGATIVE NEGATIVE   WBC, UA 0-2 <3 WBC/hpf   RBC / HPF 0-2 <3 RBC/hpf   Squamous Epithelial / LPF RARE RARE  Creatinine, urine, random     Status: None   Collection Time: 11/22/14  5:40 PM  Result Value Ref Range   Creatinine, Urine 81.0 mg/dL  Prepare RBC     Status: None   Collection Time: 11/22/14  6:17 PM  Result Value Ref Range   Order Confirmation ORDER PROCESSED BY BLOOD BANK   ABO/Rh     Status: None   Collection Time: 11/22/14  6:43 PM  Result Value Ref Range   ABO/RH(D) O POS   CBG monitoring, ED     Status: None   Collection Time: 11/22/14  9:47 PM  Result Value Ref Range   Glucose-Capillary 94 70 - 99 mg/dL  Basic metabolic panel     Status: Abnormal   Collection Time: 11/22/14 10:05 PM  Result Value Ref Range   Sodium 134 (L) 137 - 147 mEq/L   Potassium 5.3 3.7 - 5.3 mEq/L   Chloride 101 96 - 112 mEq/L   CO2 12 (L) 19 - 32 mEq/L   Glucose, Bld 103 (H) 70 - 99 mg/dL   BUN 108 (H) 6 - 23 mg/dL   Creatinine, Ser 13.72 (H) 0.50 - 1.35 mg/dL   Calcium 8.1 (L) 8.4 - 10.5 mg/dL   GFR calc non Af Amer 3 (L) >90 mL/min   GFR calc Af Amer 4 (L) >90 mL/min   Anion gap 21 (H) 5 - 15  Troponin I (q 6hr x 3)     Status: None   Collection Time: 11/22/14 10:05 PM  Result Value Ref Range   Troponin I <0.30 <0.30  ng/mL  Glucose, capillary     Status: Abnormal   Collection Time: 11/23/14 12:52 AM  Result Value Ref Range   Glucose-Capillary 115 (H) 70 - 99 mg/dL  Prepare RBC     Status: None   Collection Time: 11/23/14  2:00 AM  Result Value Ref Range   Order Confirmation ORDER PROCESSED BY BLOOD BANK   Type and screen     Status: None (Preliminary result)   Collection Time: 11/23/14  3:18 AM  Result Value Ref Range   ABO/RH(D) O POS    Antibody Screen NEG  Sample Expiration 11/26/2014    Unit Number [id number]    Blood Component Type RED CELLS,LR    Unit division 00    Status of Unit ISSUED    Transfusion Status OK TO TRANSFUSE    Crossmatch Result Compatible   ABO/Rh     Status: None   Collection Time: 11/23/14  3:18 AM  Result Value Ref Range   ABO/RH(D) O POS   Troponin I (q 6hr x 3) *Canceled*     Status: None ()   Collection Time: 11/23/14  3:56 AM   Narrative   LIS Cancel (ORR/DE = Data Error)  Magnesium *Canceled*     Status: None ()   Collection Time: 11/23/14  5:00 AM   Narrative   LIS Cancel (ORR/DE = Data Error)  Phosphorus *Canceled*     Status: None ()   Collection Time: 11/23/14  5:00 AM   Narrative   LIS Cancel (ORR/DE = Data Error)  TSH *Canceled*     Status: None ()   Collection Time: 11/23/14  5:00 AM   Narrative   LIS Cancel (ORR/DE = Data Error)  Comprehensive metabolic panel *Canceled*     Status: None ()   Collection Time: 11/23/14  5:00 AM   Narrative   LIS Cancel (ORR/DE = Data Error)  CBC *Canceled*     Status: None ()   Collection Time: 11/23/14  5:00 AM   Narrative   LIS Cancel (ORR/DE = Data Error)  Troponin I (q 6hr x 3) *Canceled*     Status: None ()   Collection Time: 11/23/14  9:56 AM   Narrative   LIS Cancel (ORR/DE = Data Error)     Dg Chest 2 View (if Patient Has Fever And/or Copd)  11/22/2014   CLINICAL DATA:  Sternal chest pain and dizziness for 2 days, shortness of breath for a month, sore throat since last week, chills since  this summer, personal history of hypertension, diabetes, former smoker  EXAM: CHEST  2 VIEW  COMPARISON:  01/28/2012  FINDINGS: Enlargement of cardiac silhouette with borderline vascular congestion.  Mediastinal contours normal.  Lungs clear.  No infiltrate, pleural effusion or pneumothorax.  Mild biconvex thoracolumbar scoliosis without acute osseous findings.  IMPRESSION: Enlargement of cardiac silhouette.  No acute abnormalities.   Electronically Signed   By: Lavonia Dana M.D.   On: 11/22/2014 16:09   Ct Head Wo Contrast  11/23/2014   CLINICAL DATA:  63 year old male with intermittent visual changes  EXAM: CT HEAD WITHOUT CONTRAST  TECHNIQUE: Contiguous axial images were obtained from the base of the skull through the vertex without intravenous contrast.  COMPARISON:  None  FINDINGS: Negative for acute intracranial hemorrhage, acute infarction, mass, mass effect, hydrocephalus or midline shift. Gray-white differentiation is preserved throughout. Very mild atrophy. Mild periventricular and subcortical white matter hypoattenuation most consistent with the sequelae of longstanding microvascular ischemic white matter disease. No focal soft tissue or calvarial abnormality. No focal global abnormality. The orbits are symmetric. Normal aeration of the mastoid air cells and visualized paranasal sinuses. Trace atherosclerotic calcification in the bilateral cavernous carotid arteries.  IMPRESSION: 1. No acute intracranial abnormality. 2. 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 3. Intracranial atherosclerosis.   Electronically Signed   By: Jacqulynn Cadet M.D.   On: 11/23/2014 03:08    ROS:  As stated above in the HPI otherwise negative.  Blood pressure 153/80, pulse 76, temperature 99.3 F (37.4 C), temperature source Oral, resp. rate 17,  height 6\' 1"  (1.854 m), SpO2 100 %.    PE: Gen: Somnolent Neck: Supple, no LAD Lungs: CTA Bilaterally CV: RRR without M/G/R ABM: Soft,  NTND, +BS Ext: No C/C/E  Assessment/Plan: 1) Anemia. 2) Heme positive stool. 3) Hematuria. 4) Renal failure.   The patient will require further evaluation with an EGD/Colonoscopy, but his renal failure takes precedence.  The plan is to start him on HD as soon as possible.  Once he is metabolic stabilized an EGD/Colonoscopy can be pursued.  Plan: 1) Dialysis per Renal. 2) Follow HGB.   Loveah Like D 11/23/2014, 9:09 AM

## 2014-11-28 NOTE — Interval H&P Note (Signed)
History and Physical Interval Note:  11/28/2014 2:15 PM  [name]  has presented today for surgery, with the diagnosis of anemia and heme + stool  The various methods of treatment have been discussed with the patient and family. After consideration of risks, benefits and other options for treatment, the patient has consented to  Procedure(s): COLONOSCOPY (N/A) ESOPHAGOGASTRODUODENOSCOPY (EGD) (N/A) as a surgical intervention .  The patient's history has been reviewed, patient examined, no change in status, stable for surgery.  I have reviewed the patient's chart and labs.  Questions were answered to the patient's satisfaction.     Pricilla Riffle. Fuller Plan MD

## 2014-11-28 NOTE — Telephone Encounter (Signed)
-----   Message from Mena Goes, RN sent at 11/28/2014  3:14 PM EST ----- Regarding: Schedule   ----- Message -----    From: Gabriel Earing, PA-C    Sent: 11/28/2014   1:49 PM      To: [facility] Charge Pool  S/p left brachiocephalic AVF [id number].  F/u with Dr. Bridgett Larsson in 6 weeks.  Thanks, Aldona Bar

## 2014-11-28 NOTE — Progress Notes (Signed)
    Subjective:  Denies CP or dyspnea   Objective:  Filed Vitals:   11/27/14 1706 11/27/14 1750 11/27/14 2039 11/28/14 0540  BP:  151/62 151/66 149/60  Pulse:  86 87 83  Temp:  98.7 F (37.1 C) 99.8 F (37.7 C) 100.3 F (37.9 C)  TempSrc:   Oral Oral  Resp:  17 18 19   Height:      Weight: 190 lb 11.2 oz (86.5 kg)     SpO2:  98% 97% 98%    Intake/Output from previous day:  Intake/Output Summary (Last 24 hours) at 11/28/14 0756 Last data filed at 11/28/14 [id number]  Gross per 24 hour  Intake   1540 ml  Output   1741 ml  Net   -201 ml    Physical Exam: Physical exam: Well-developed well-nourished in no acute distress.  Skin is warm and dry.  HEENT is normal.  Neck is supple.  Chest is clear to auscultation with normal expansion.  Cardiovascular exam is regular rate and rhythm.  Abdominal exam nontender or distended. No masses palpated. Extremities show no edema. neuro grossly intact    Lab Results: Basic Metabolic Panel:  Recent Labs  11/25/14 1320 11/26/14 0424 11/27/14 1434  NA 135* 134* 136*  K 4.2 4.2 4.3  CL 98 96 98  CO2 21 22 21   GLUCOSE 97 88 143*  BUN 52* 35* 44*  CREATININE 8.08* 5.97* 7.87*  CALCIUM 7.5* 7.9* 8.1*  PHOS 4.8*  --  6.3*   CBC:  Recent Labs  11/27/14 0442 11/27/14 1433 11/28/14 0547  WBC 10.1 9.7  --   HGB 7.9* 7.7* 7.5*  HCT 25.0* 24.2* 24.1*  MCV 82.0 82.9  --   PLT 274 256  --      Assessment/Plan:  1 cardiomyopathy-patient is much improved following initiation of dialysis and improved blood pressure control. Cath revealed no CAD; most likely hypertensive cardiomyopathy. Continue low-dose ACE inhibitor and increase carvedilol to 25 mg po BID. Continue hydralazine and nitrates. Further titration of medications as tolerated by blood pressure and pulse as outpatient. We will be careful not to advance medications too quickly particularly given initiation of dialysis. Continue aspirin and statin. Following full titration  of medications, plan repeat echocardiogram in 3 months. Hopefully LV function will have improved with above measures. Given end-stage renal disease not clear he would be a candidate for ICD. 2 end-stage renal failure-dialysis has been initiated.  3 hypertension-blood pressure remains mildly increased. Increase coreg to 25 mg po BID 4 anemia-patient was severely anemic at time of presentation. Improved with transfusion. He is heme positive and EGD/colonoscopy is planned. There also is clearly a contribution from end-stage renal disease and decreased erythropoietin. 5 diabetes mellitus-management per primary care. 6 SVT-continue beta blocker.  [name] 11/28/2014, 7:56 AM

## 2014-11-29 ENCOUNTER — Encounter (HOSPITAL_COMMUNITY): Payer: Self-pay | Admitting: Gastroenterology

## 2014-11-29 LAB — GLUCOSE, CAPILLARY
GLUCOSE-CAPILLARY: 160 mg/dL — AB (ref 70–99)
Glucose-Capillary: 106 mg/dL — ABNORMAL HIGH (ref 70–99)
Glucose-Capillary: 179 mg/dL — ABNORMAL HIGH (ref 70–99)

## 2014-11-29 LAB — BASIC METABOLIC PANEL
ANION GAP: 16 — AB (ref 5–15)
BUN: 41 mg/dL — ABNORMAL HIGH (ref 6–23)
CO2: 23 mEq/L (ref 19–32)
CREATININE: 8.15 mg/dL — AB (ref 0.50–1.35)
Calcium: 8.2 mg/dL — ABNORMAL LOW (ref 8.4–10.5)
Chloride: 99 mEq/L (ref 96–112)
GFR, EST AFRICAN AMERICAN: 7 mL/min — AB (ref >90)
GFR, EST NON AFRICAN AMERICAN: 6 mL/min — AB (ref >90)
Glucose, Bld: 119 mg/dL — ABNORMAL HIGH (ref 70–99)
POTASSIUM: 4 meq/L (ref 3.7–5.3)
Sodium: 138 mEq/L (ref 137–147)

## 2014-11-29 LAB — CBC
HEMATOCRIT: 20.9 % — AB (ref 39.0–52.0)
Hemoglobin: 6.7 g/dL — CL (ref 13.0–17.0)
MCH: 26.7 pg (ref 26.0–34.0)
MCHC: 32.1 g/dL (ref 30.0–36.0)
MCV: 83.3 fL (ref 78.0–100.0)
PLATELETS: 210 10*3/uL (ref 150–400)
RBC: 2.51 MIL/uL — ABNORMAL LOW (ref 4.22–5.81)
RDW: 17.5 % — AB (ref 11.5–15.5)
WBC: 7.2 10*3/uL (ref 4.0–10.5)

## 2014-11-29 LAB — HEMOGLOBIN AND HEMATOCRIT, BLOOD
HEMATOCRIT: 26.5 % — AB (ref 39.0–52.0)
Hemoglobin: 8.7 g/dL — ABNORMAL LOW (ref 13.0–17.0)

## 2014-11-29 LAB — PREPARE RBC (CROSSMATCH)

## 2014-11-29 MED ORDER — "INSULIN SYRINGE-NEEDLE U-100 25G X 1"" 1 ML MISC": Status: DC

## 2014-11-29 MED ORDER — AMLODIPINE BESYLATE 10 MG PO TABS: 10.0000 mg | ORAL_TABLET | Freq: Every day | ORAL | Status: DC

## 2014-11-29 MED ORDER — INSULIN ASPART 100 UNIT/ML ~~LOC~~ SOLN: SUBCUTANEOUS | Status: DC

## 2014-11-29 MED ORDER — ISOSORBIDE MONONITRATE ER 30 MG PO TB24: 30.0000 mg | ORAL_TABLET | Freq: Every day | ORAL | Status: DC

## 2014-11-29 MED ORDER — FREESTYLE SYSTEM KIT: 1.0000 | PACK | Freq: Three times a day (TID) | Status: DC

## 2014-11-29 MED ORDER — DOXERCALCIFEROL 4 MCG/2ML IV SOLN
INTRAVENOUS | Status: AC
  Filled 2014-11-29: qty 2

## 2014-11-29 MED ORDER — SODIUM CHLORIDE 0.9 % IV SOLN: Freq: Once | INTRAVENOUS | Status: DC

## 2014-11-29 MED ORDER — CALCIUM ACETATE 667 MG PO CAPS: 1334.0000 mg | ORAL_CAPSULE | Freq: Three times a day (TID) | ORAL | Status: DC

## 2014-11-29 MED ORDER — HYDRALAZINE HCL 50 MG PO TABS: 50.0000 mg | ORAL_TABLET | Freq: Three times a day (TID) | ORAL | Status: DC

## 2014-11-29 MED ORDER — PANTOPRAZOLE SODIUM 40 MG PO TBEC: 40.0000 mg | DELAYED_RELEASE_TABLET | Freq: Every day | ORAL | Status: DC

## 2014-11-29 MED ORDER — HYDROCODONE-ACETAMINOPHEN 5-325 MG PO TABS: 1.0000 | ORAL_TABLET | Freq: Four times a day (QID) | ORAL | Status: DC | PRN

## 2014-11-29 MED ORDER — RENA-VITE PO TABS: 1.0000 | ORAL_TABLET | Freq: Every day | ORAL | Status: DC

## 2014-11-29 MED ORDER — LISINOPRIL 5 MG PO TABS: 5.0000 mg | ORAL_TABLET | Freq: Every day | ORAL | Status: DC

## 2014-11-29 NOTE — Discharge Summary (Signed)
[name], is a 63 y.o. male  DOB Jan 11, 1951  MRN [id number].  Admission date:  11/22/2014  Admitting Physician  Toy Baker, MD  Discharge Date:  11/30/2014   Primary MD  [name], MD  Recommendations for primary care physician for things to follow:   Kindly follow glycemic control, make sure patient follows with GI and cardiology closely.   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 Diabetes mellitus   . HTN (hypertension)   . Osteomyelitis     Past Surgical History  Procedure Laterality Date  . Left 4th toe amputation    . Cholecystectomy    . Tonsillectomy    . I&d extremity  01/28/2012    Procedure: IRRIGATION AND DEBRIDEMENT EXTREMITY;  Surgeon: Paulene Floor, MD;  Location: [facility];  Service: Orthopedics;  Laterality: Left;  irrigation and drainage left hand  . I&d extremity  01/30/2012    Procedure: IRRIGATION AND DEBRIDEMENT EXTREMITY;  Surgeon: Paulene Floor, MD;  Location: [facility];  Service: Orthopedics;  Laterality: Left;  flexor teno synovectomy and extensor tenosynovectomy arthrosynovectomy wristjoint  . I&d extremity  02/01/2012    Procedure: IRRIGATION AND DEBRIDEMENT  EXTREMITY;  Surgeon: Paulene Floor, MD;  Location: [facility];  Service: Orthopedics;  Laterality: Left;  Left Wrist, Hand and Forearm  . Colonoscopy N/A 11/28/2014    Procedure: COLONOSCOPY;  Surgeon: Ladene Artist, MD;  Location: [facility];  Service: Endoscopy;  Laterality: N/A;  . Esophagogastroduodenoscopy N/A 11/28/2014    Procedure: ESOPHAGOGASTRODUODENOSCOPY (EGD);  Surgeon: Ladene Artist, MD;  Location: [facility];  Service: Endoscopy;  Laterality: N/A;  . Av fistula placement Left 11/27/2014    Procedure: ARTERIOVENOUS (AV) FISTULA CREATION;  Surgeon: Conrad , MD;  Location: [facility];  Service: Vascular;  Laterality: Left;       History of present illness and  Hospital Course:     Kindly see H&P for history of present illness and admission details, please review complete Labs, Consult reports and Test reports for all details in brief  HPI 64 year old male with history of diabetes mellitus, CKD, iron deficiency anemia, hypertension presented with 1 month history of chest discomfort and shortness of breath. The patient has not seen a physician for nearly 2 years. Upon presentation, he was found to have a serum creatinine of 13.72  and hemoglobin of 5.3. The patient stated that he has only been taking his 70/30 insulin on a when necessary basis when his CBGs are greater than 150. He states that he is only been checking his CBGs once daily. The patient stated that he has been having increasing dyspnea on exertion with intermittent chest discomfort. He denies any NSAID use. He denies any hematemesis, hematochezia, melena, hematuria. The patient states that he has not taken any antihypertensive medications for at least 6 months. Workup revealed a positive FOBT. Gastroenterology was consulted and continues to follow the patient. Echocardiogram was performed and revealed EF 20-25% with severe pulmonary artery hypertension. As a result, cardiology was consulted and feels that the patient  needs a right and left heart catheterization--scheduled 11/26/14. nephrology was consulted, and the temporary dialysis catheter was placed and the patient was initiated on dialysis on 11/23/2014.Gastroenterology plans upper and lower endoscopy.  Hospital Course    1. Acute on chronic renal failure now ESRD. Been dialyzed through a right IJ dialysis catheter placed 11/23/2014, left arm AV fistula placed by vascular surgery on 11/27/2014. Clip process done.    2. Symptomatic anemia of chronic disease along with iron deficiency anemia.  He status post 6 units of packed RBC transfusion this admission, H&H stable, also getting IV iron per renal. EGD colonoscopy done by GI showing gastritis along with some diverticulosis. Will follow with GI outpatient D/W with Dr. Fuller Plan in the morning of 11/30/2014, cleared for home discharge per GI standpoint. Will be discharged on PPI.    3. 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 Cardiology following, symptomatically improved after fluid removal through dialysis, status post right and left heart cath shows normal coronaries and confirms severe pulmonary hypertension. Currently on combination of Coreg, hydralazine, Imdur, lisinopril along with dialysis for fluid removal. Continue to monitor. Further workup per cardiology the outpatient setting he will follow with them post discharge.    4. SVT/atrial tachycardia. Symptom-free cardiology following, stable on telemetry, continue Coreg.    5. DM type II. Noncompliant with home regimen which was 7030, A1c 5.7. Now on sliding scale.    6. Essential hypertension in poor control. Currently stable on a combination of Coreg, lisinopril, hydralazine along with Imdur. Follow closely.     Code Status: Full  Family Communication: Wife bedside  Disposition Plan: Home likely 11/29/2014       Discharge Condition: Stable   Follow UP  Follow-up Information     Follow up with Hinda Lenis, MD In 6 weeks.   Specialty:  Vascular Surgery   Why:  Our office will call you to arrange an appointment    Contact information:   [place] [address] [phone]       Follow up with [name], MD. Schedule an appointment as soon as possible for a visit in 3 days.   Specialty:  Family Medicine   Contact information:   [place] [address] [phone]       Follow up with Norberto Sorenson T. Fuller Plan, MD. Schedule an appointment as soon as possible for a visit in 1 week.   Specialty:  Gastroenterology   Contact information:   [address]  [address] [phone]       Follow up with DUNHAM,CYNTHIA B, MD. Schedule an appointment as soon as possible for a visit in 1 week.   Specialty:  Nephrology   Contact information:   [place]  [address] [phone]       Follow up with Kirk Ruths, MD. Schedule an appointment as soon as possible for a visit in 1 week.   Specialty:  Cardiology   Contact information:   [address] [place] [address] [phone]         Discharge Instructions  and  Discharge Medications          Discharge Instructions    Discharge instructions    Complete by:  As directed   Follow with Primary MD [name], MD in 3-4 days   Get CBC, CMP, 2 view Chest X ray checked  by Primary MD next visit.    Activity: As tolerated with Full fall precautions use walker/cane & assistance as needed   Disposition Home     Diet: Renal,  Check your Weight same time everyday, if you gain over 2 pounds, or you develop in leg swelling, experience more shortness of breath or chest pain, call your Primary MD immediately. Follow Cardiac Low Salt Diet and 1.8 lit/day fluid restriction.   On your next visit with your primary care physician please Get Medicines reviewed and adjusted.   Please request your Prim.MD to go over all Hospital  Tests and Procedure/Radiological results at the follow up, please get all Hospital records sent to your Prim MD by signing hospital release before you go home.   If you experience worsening of your admission symptoms, develop shortness of breath, life threatening emergency, suicidal or homicidal thoughts you must seek medical attention immediately by calling 911 or calling your MD immediately  if symptoms less severe.  You Must read complete instructions/literature along with all the possible adverse reactions/side effects for all the Medicines you take and that have been prescribed to you. Take any new Medicines after you have completely understood and accpet all the possible adverse reactions/side effects.   Do not drive, operating heavy machinery, perform activities at heights, swimming or participation in water activities or provide baby sitting services if your were admitted for syncope or siezures until you have seen by Primary MD or a Neurologist and advised to do so again.  Do not drive when taking Pain medications.    Do not take more than prescribed Pain, Sleep and Anxiety Medications  Special Instructions: If you have smoked or chewed Tobacco  in the last 2 yrs please stop smoking, stop any regular Alcohol  and or any Recreational drug use.  Wear Seat belts while driving.   Please note  You were cared for by a hospitalist during your hospital stay. If you have any questions about your discharge medications or the care you received while you were in the hospital after you are discharged, you can call the unit and asked to speak with the hospitalist on call if the hospitalist that took care of you is not available. Once you are discharged, your primary care physician will handle any further medical issues. Please note that NO REFILLS for any discharge medications will be authorized once you are discharged, as it is imperative that you return to your primary care physician (or establish  a relationship with a primary care physician if you do not have one) for your aftercare needs so that they can reassess your need for medications and monitor your lab values.     Discharge patient    Complete by:  As directed      Increase activity slowly    Complete by:  As directed  Medication List    STOP taking these medications        aspirin 81 MG tablet     insulin NPH-regular Human (70-30) 100 UNIT/ML injection  Commonly known as:  NOVOLIN 70/30      TAKE these medications        amLODipine 10 MG tablet  Commonly known as:  NORVASC  Take 1 tablet (10 mg total) by mouth daily.     calcium acetate 667 MG capsule  Commonly known as:  PHOSLO  Take 2 capsules (1,334 mg total) by mouth 3 (three) times daily with meals.     glucose monitoring kit monitoring kit  1 each by Does not apply route 4 (four) times daily - after meals and at bedtime. 1 month Diabetic Testing Supplies for QAC-QHS accuchecks.Any brand OK     hydrALAZINE 50 MG tablet  Commonly known as:  APRESOLINE  Take 1 tablet (50 mg total) by mouth every 8 (eight) hours.     HYDROcodone-acetaminophen 5-325 MG per tablet  Commonly known as:  NORCO/VICODIN  Take 1-2 tablets by mouth every 6 (six) hours as needed for moderate pain.     insulin aspart 100 UNIT/ML injection  Commonly known as:  novoLOG  - Before each meal 3 times a day, 140-199 - 2 units, 200-250 - 4 units, 251-299 - 6 units,  300-349 - 8 units,  350 or above 10 units.  - Insulin PEN if approved, provide syringes and needles if needed.     Insulin Syringe-Needle U-100 25G X 1" 1 ML Misc  Any brand, for 4 times a day insulin SQ, 1 month supply.     isosorbide mononitrate 30 MG 24 hr tablet  Commonly known as:  IMDUR  Take 1 tablet (30 mg total) by mouth daily.     lisinopril 5 MG tablet  Commonly known as:  PRINIVIL,ZESTRIL  Take 1 tablet (5 mg total) by mouth daily.     multivitamin Tabs tablet  Take 1 tablet by mouth at  bedtime.     pantoprazole 40 MG tablet  Commonly known as:  PROTONIX  Take 1 tablet (40 mg total) by mouth daily at 12 noon.          Diet and Activity recommendation: See Discharge Instructions above   Consults obtained - GI, Renal, VVS   Major procedures and Radiology Reports - PLEASE review detailed and final reports for all details, in brief -     L arm AV fistula 11-27-14 Dr Mariane Baumgarten Dialysis catheter IR 11-23-14   EGD and colonoscopy   ENDOSCOPIC IMPRESSION: 1. Normal colonoscopy 2. Grade I internal hemorrhoids  RECOMMENDATIONS: 1. Continue to follow colorectal cancer screening guidelines for "routine risk" patients with a repeat colonoscopy in 10 years with Dr. Deatra Ina. There is no need for routine, screening for FOBT (stool) testing for at least 5 years. 2. Upper endoscopy today 3. GI follow up with Dr. Deatra Ina prn  eSigned: Ladene Artist, MD, [facility] 11/28/2014 3:26 PM Revised: 11/28/2014 3:26 PM    ENDOSCOPIC IMPRESSION: 1. Gastritis in the gastric fundus and gastric body; multiple biopsies performed 2. The EGD otherwise appeared normal  RECOMMENDATIONS: 1. Await pathology results 2. Continue PPI daily and Fe replacement 3. Follow up with Dr. Deatra Ina prn  eSigned: Ladene Artist, MD, [facility] 11/28/2014 3:27 PM Revised: 11/28/2014 3:27 PM    TTE  - Left ventricle: The cavity size was at the upper limits of normal. Wall thickness  was increased in a pattern of moderateLVH. Systolic function was severely reduced. The estimatedejection fraction was in the range of 20% to 25%. Diffusehypokinesis. Doppler parameters are consistent with restrictivephysiology, indicative of decreased left ventricular diastolic compliance and/or increased left atrial pressure. - Left atrium: The atrium was moderately dilated. - Right ventricle: Systolic function was moderately reduced. - Right atrium: The atrium was mildly dilated. - Tricuspid  valve: There was moderate-severe regurgitation. - Pulmonary arteries: Systolic pressure was severely increased. PA peak pressure: 98 mm Hg (S).  Mitral valve:  Structurally normal valve.  Leaflet separation was normal. Doppler: Transvalvular velocity was within the normal range. There was no evidence for stenosis. There was trivial regurgitation.  Peak gradient (D): 7 mm Hg.   Left and right heart cath 11/26/2014. Dr Ellyn Hack    Hypertensive / Non-ischemic Cardiomyopathy with improved LVEF by LV Gram  Severe Secondary Pulmonary Hypertension with suggestion of possible mitral valve disease  Likely high output with thermodilution and Fick outputs of 8.5 &11.17 respectively  Angiographically normal coronary arteries    Dg Chest 2 View (if Patient Has Fever And/or Copd)  11/22/2014   CLINICAL DATA:  Sternal chest pain and dizziness for 2 days, shortness of breath for a month, sore throat since last week, chills since this summer, personal history of hypertension, diabetes, former smoker  EXAM: CHEST  2 VIEW  COMPARISON:  01/28/2012  FINDINGS: Enlargement of cardiac silhouette with borderline vascular congestion.  Mediastinal contours normal.  Lungs clear.  No infiltrate, pleural effusion or pneumothorax.  Mild biconvex thoracolumbar scoliosis without acute osseous findings.  IMPRESSION: Enlargement of cardiac silhouette.  No acute abnormalities.   Electronically Signed   By: Lavonia Dana M.D.   On: 11/22/2014 16:09   Ct Head Wo Contrast  11/23/2014   CLINICAL DATA:  63 year old male with intermittent visual changes  EXAM: CT HEAD WITHOUT CONTRAST  TECHNIQUE: Contiguous axial images were obtained from the base of the skull through the vertex without intravenous contrast.  COMPARISON:  None  FINDINGS: Negative for acute intracranial hemorrhage, acute infarction, mass, mass effect, hydrocephalus or midline shift. Gray-white differentiation is preserved throughout. Very mild atrophy. Mild  periventricular and subcortical white matter hypoattenuation most consistent with the sequelae of longstanding microvascular ischemic white matter disease. No focal soft tissue or calvarial abnormality. No focal global abnormality. The orbits are symmetric. Normal aeration of the mastoid air cells and visualized paranasal sinuses. Trace atherosclerotic calcification in the bilateral cavernous carotid arteries.  IMPRESSION: 1. No acute intracranial abnormality. 2. 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 3. Intracranial atherosclerosis.   Electronically Signed   By: Jacqulynn Cadet M.D.   On: 11/23/2014 03:08   US Renal  11/23/2014   CLINICAL DATA:  Renal failure  EXAM: RENAL/URINARY TRACT ULTRASOUND COMPLETE  COMPARISON:  None.  FINDINGS: Right Kidney:  Length: 10.0 cm.  5 mm interpolar calculus.  No hydronephrosis.  Left Kidney:  Length: 9.7 cm.  No mass or hydronephrosis.  Bladder:  Within normal limits.  IMPRESSION: 5 mm interpolar right renal calculus.  No hydronephrosis.   Electronically Signed   By: Julian Hy M.D.   On: 11/23/2014 12:40   Ir Fluoro Guide Cv Line Right  11/23/2014   CLINICAL DATA:  Progressive renal insufficiency, needs long-term access for hemodialysis  EXAM: TUNNELED HEMODIALYSIS CATHETER PLACEMENT WITH ULTRASOUND AND FLUOROSCOPIC GUIDANCE  TECHNIQUE: The procedure, risks, benefits, and alternatives were explained to the patient. Questions regarding the procedure were encouraged and answered.  The patient understands and consents to the procedure. As antibiotic prophylaxis, vancomycin 1 g was ordered pre-procedure and administered intravenously within one hour of incision.Patency of the right IJ vein was confirmed with ultrasound with image documentation. An appropriate skin site was determined. Region was prepped using maximum barrier technique including cap and mask, sterile gown, sterile gloves, large sterile sheet, and Chlorhexidine as  cutaneous antisepsis. The region was infiltrated locally with 1% lidocaine.  Intravenous Fentanyl and Versed were administered as conscious sedation during continuous cardiorespiratory monitoring by the radiology RN, with a total moderate sedation time of 11 minutes.  Under real-time ultrasound guidance, the right IJ vein was accessed with a 21 gauge micropuncture needle; the needle tip within the vein was confirmed with ultrasound image documentation. Needle exchanged over the 018 guidewire for transitional dilator, which allowed advancement of a Benson wire into the IVC. Over this, an MPA catheter was advanced. A Hemosplit 19 hemodialysis catheter was tunneled from the right anterior chest wall approach to the right IJ dermatotomy site. The MPA catheter was exchanged over an Amplatz wire for serial vascular dilators which allow placement of a peel-away sheath, through which the catheter was advanced under intermittent fluoroscopy, positioned with its tips in the proximal and midright atrium. Spot chest radiograph confirms good catheter position. No pneumothorax. Catheter was flushed and primed per protocol. Catheter secured externally with O Prolene sutures. The right IJ dermatotomy site was closed with Dermabond.  COMPLICATIONS: COMPLICATIONS None immediate  FLUOROSCOPY TIME:  12 seconds  COMPARISON:  None  IMPRESSION: 1. Technically successful placement of tunneled right IJ hemodialysis catheter with ultrasound and fluoroscopic guidance. Ready for routine use.  ACCESS: Remains approachable for percutaneous intervention as needed.   Electronically Signed   By: Arne Cleveland M.D.   On: 11/23/2014 14:25   Ir US Guide Vasc Access Right  11/23/2014   CLINICAL DATA:  Progressive renal insufficiency, needs long-term access for hemodialysis  EXAM: TUNNELED HEMODIALYSIS CATHETER PLACEMENT WITH ULTRASOUND AND FLUOROSCOPIC GUIDANCE  TECHNIQUE: The procedure, risks, benefits, and alternatives were explained to the  patient. Questions regarding the procedure were encouraged and answered. The patient understands and consents to the procedure. As antibiotic prophylaxis, vancomycin 1 g was ordered pre-procedure and administered intravenously within one hour of incision.Patency of the right IJ vein was confirmed with ultrasound with image documentation. An appropriate skin site was determined. Region was prepped using maximum barrier technique including cap and mask, sterile gown, sterile gloves, large sterile sheet, and Chlorhexidine as cutaneous antisepsis. The region was infiltrated locally with 1% lidocaine.  Intravenous Fentanyl and Versed were administered as conscious sedation during continuous cardiorespiratory monitoring by the radiology RN, with a total moderate sedation time of 11 minutes.  Under real-time ultrasound guidance, the right IJ vein was accessed with a 21 gauge micropuncture needle; the needle tip within the vein was confirmed with ultrasound image documentation. Needle exchanged over the 018 guidewire for transitional dilator, which allowed advancement of a Benson wire into the IVC. Over this, an MPA catheter was advanced. A Hemosplit 19 hemodialysis catheter was tunneled from the right anterior chest wall approach to the right IJ dermatotomy site. The MPA catheter was exchanged over an Amplatz wire for serial vascular dilators which allow placement of a peel-away sheath, through which the catheter was advanced under intermittent fluoroscopy, positioned with its tips in the proximal and midright atrium. Spot chest radiograph confirms good catheter position. No pneumothorax. Catheter was flushed and primed per protocol. Catheter secured externally with  O Prolene sutures. The right IJ dermatotomy site was closed with Dermabond.  COMPLICATIONS: COMPLICATIONS None immediate  FLUOROSCOPY TIME:  12 seconds  COMPARISON:  None  IMPRESSION: 1. Technically successful placement of tunneled right IJ hemodialysis  catheter with ultrasound and fluoroscopic guidance. Ready for routine use.  ACCESS: Remains approachable for percutaneous intervention as needed.   Electronically Signed   By: Arne Cleveland M.D.   On: 11/23/2014 14:25    Micro Results      Recent Results (from the past 240 hour(s))  Urine culture     Status: None   Collection Time: 11/22/14  5:40 PM  Result Value Ref Range Status   Specimen Description URINE, RANDOM  Final   Special Requests NONE  Final   Culture  Setup Time   Final    11/22/2014 21:38 Performed at [place] Performed at [facility]   Final   Culture NO GROWTH Performed at [facility]   Final   Report Status 11/23/2014 FINAL  Final  Surgical pcr screen     Status: None   Collection Time: 11/26/14  6:59 AM  Result Value Ref Range Status   MRSA, PCR NEGATIVE NEGATIVE Final   Staphylococcus aureus NEGATIVE NEGATIVE Final    Comment:        The Xpert SA Assay (FDA approved for NASAL specimens in patients over 50 years of age), is one component of a comprehensive surveillance program.  Test performance has been validated by [employer] for patients greater than or equal to 52 year old. It is not intended to diagnose infection nor to guide or monitor treatment.        Today   Subjective:   [name] today has no headache,no chest abdominal pain,no new weakness tingling or numbness, feels much better wants to go home today.    Objective:   Blood pressure 122/59, pulse 67, temperature 98.6 F (37 C), temperature source Oral, resp. rate 18, height '6\' 1"'  (1.854 m), weight 89.9 kg (198 lb 3.1 oz), SpO2 99 %.   Intake/Output Summary (Last 24 hours) at 11/30/14 1053 Last data filed at 11/30/14 1001  Gross per 24 hour  Intake   1030 ml  Output    745 ml  Net    285 ml    Exam Awake Alert, Oriented x 3, No new F.N deficits, Normal affect [place].AT,PERRAL Supple Neck,No JVD, No cervical  lymphadenopathy appriciated.  Symmetrical Chest wall movement, Good air movement bilaterally, CTAB RRR,No Gallops,Rubs or new Murmurs, No Parasternal Heave +ve B.Sounds, Abd Soft, Non tender, No organomegaly appriciated, No rebound -guarding or rigidity. No Cyanosis, Clubbing or edema, No new Rash or bruise  Data Review   CBC w Diff:  Lab Results  Component Value Date   WBC 7.2 11/29/2014   HGB 8.2* 11/30/2014   HCT 25.3* 11/30/2014   PLT 210 11/29/2014   LYMPHOPCT 13 02/03/2012   MONOPCT 10 02/03/2012   EOSPCT 3 02/03/2012   BASOPCT 0 02/03/2012    CMP:  Lab Results  Component Value Date   NA 138 11/29/2014   K 4.0 11/29/2014   CL 99 11/29/2014   CO2 23 11/29/2014   BUN 41* 11/29/2014   CREATININE 8.15* 11/29/2014   PROT 7.0 01/31/2012   ALBUMIN 2.6* 11/27/2014   BILITOT 0.3 01/31/2012   ALKPHOS 92 01/31/2012   AST 103* 01/31/2012   ALT 119* 01/31/2012  .   Total Time  in preparing paper work, data evaluation and todays exam - 35 minutes  Thurnell Lose M.D on 11/30/2014 at 10:53 AM  [facility] Office  [phone]

## 2014-11-29 NOTE — Procedures (Signed)
I was present at this dialysis session, have reviewed the session itself and made  appropriate changes  Kelly Splinter MD (pgr) [phone]    ([phone] 11/29/2014, 3:44 PM

## 2014-11-29 NOTE — Care Management Note (Signed)
[place] letter prepared and explained to pt for possible d/c.  CRoyal RN MPH, case manager, [phone]

## 2014-11-29 NOTE — Progress Notes (Signed)
CRITICAL VALUE ALERT  Critical value received:  Hemoglobin 6.7 Date of notification:  11/29/2014   Time of notification: 12:33 PM   Critical value read back:Yes.    Nurse who received alert:  [name]    MD notified (1st page):  Dr. Candiss Norse  Time of first page:  1233  MD notified (2nd page):  Time of second page:  Responding MD:  Dr. Candiss Norse   Time MD responded:  *[phone]

## 2014-11-29 NOTE — Discharge Instructions (Signed)
Follow with Primary MD [name] Frees, MD in 3-4 days   Get CBC, CMP, 2 view Chest X ray checked  by Primary MD next visit.    Activity: As tolerated with Full fall precautions use walker/cane & assistance as needed   Disposition Home     Diet: Renal,  Check your Weight same time everyday, if you gain over 2 pounds, or you develop in leg swelling, experience more shortness of breath or chest pain, call your Primary MD immediately. Follow Cardiac Low Salt Diet and 1.8 lit/day fluid restriction.   On your next visit with your primary care physician please Get Medicines reviewed and adjusted.   Please request your Prim.MD to go over all Hospital Tests and Procedure/Radiological results at the follow up, please get all Hospital records sent to your Prim MD by signing hospital release before you go home.   If you experience worsening of your admission symptoms, develop shortness of breath, life threatening emergency, suicidal or homicidal thoughts you must seek medical attention immediately by calling 911 or calling your MD immediately  if symptoms less severe.  You Must read complete instructions/literature along with all the possible adverse reactions/side effects for all the Medicines you take and that have been prescribed to you. Take any new Medicines after you have completely understood and accpet all the possible adverse reactions/side effects.   Do not drive, operating heavy machinery, perform activities at heights, swimming or participation in water activities or provide baby sitting services if your were admitted for syncope or siezures until you have seen by Primary MD or a Neurologist and advised to do so again.  Do not drive when taking Pain medications.    Do not take more than prescribed Pain, Sleep and Anxiety Medications  Special Instructions: If you have smoked or chewed Tobacco  in the last 2 yrs please stop smoking, stop any regular Alcohol  and or any Recreational drug  use.  Wear Seat belts while driving.   Please note  You were cared for by a hospitalist during your hospital stay. If you have any questions about your discharge medications or the care you received while you were in the hospital after you are discharged, you can call the unit and asked to speak with the hospitalist on call if the hospitalist that took care of you is not available. Once you are discharged, your primary care physician will handle any further medical issues. Please note that NO REFILLS for any discharge medications will be authorized once you are discharged, as it is imperative that you return to your primary care physician (or establish a relationship with a primary care physician if you do not have one) for your aftercare needs so that they can reassess your need for medications and monitor your lab values.

## 2014-11-29 NOTE — Progress Notes (Signed)
[facility] Progress Note  Assessment/Plan: 1.   NEW ESRD - improved with HD. New IJ cath and new AVF LUA 12/2 by Dr Bridgett Larsson. Clipped To [facility] MWF Financial counselor to see today for Medicaid application. HD today 2. Anemia - Hgb 5.3 admit up to 7.7> 7.9.7.5/ + Fe deficiency. s/p PRBC Replete iron. Started Aranesp 25  Hemoccult stools Pos. 3. 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 HTN - bp 149/60 on Meds/volume control with HD - cramping end of recent HD; appears at edw now and on meds per Card. 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 DM - no meds / Diet control now / A1C 5.7 8.. Dyspnea - multifactorial= Now resolved.likely related to his severe anemia and volume issues and CM/ 9. H/o toe amputation in the past 10. Nutrition - alb 2.6 /anorexia resolved after HD txs - needs education = Wife Given NKF website info; continue education at dialysis after d/c 11. Elevated temp 99.6 - follow 12. Disp - anticipate d/c today if he remains stable  [name], PA-C [place] [phone] 11/29/2014,11:02 AM  LOS: 7 days   Pt seen, examined and agree w A/P as above.  Kelly Splinter MD pager [phone]    cell [phone] 11/29/2014, 3:43 PM    Subjective:   Asking how much function his kidneys have and if he is eligible for transplant  Objective Filed Vitals:   11/28/14 1540 11/28/14 1733 11/28/14 2107 11/29/14 0535  BP: 159/49 137/57 110/47 115/50   Pulse: 88 89 77 68  Temp:  98.6 F (37 C) 99.6 F (37.6 C) 99.6 F (37.6 C)  TempSrc:  Oral Oral Oral  Resp: 16 18 18 17   Height:      Weight:      SpO2: 93% 100% 97% 95%   Physical Exam General: NAD Heart: RRR 26 murmur Lungs: no rales Abdomen: soft NT Extremities: no LE edema Dialysis Access: right IJ and left upper AVF + bruit  Additional Objective Labs: Basic Metabolic Panel:  Recent Labs Lab 11/24/14 0407 11/25/14 1320 11/26/14 0424 11/27/14 1434  NA 136* 135* 134* 136*  K 4.5 4.2 4.2 4.3  CL 101 98 96 98  CO2 18* 21 22 21   GLUCOSE 97 97 88 143*  BUN 67* 52* 35* 44*  CREATININE 9.76* 8.08* 5.97* 7.87*  CALCIUM 7.6* 7.5* 7.9* 8.1*  PHOS 4.8* 4.8*  --  6.3*   Liver Function Tests:  Recent Labs Lab 11/24/14 0407 11/25/14 1320 11/27/14 1434  ALBUMIN 2.7* 2.7* 2.6*   CBC:  Recent Labs Lab 11/24/14 0407 11/25/14 1320 11/26/14 0424 11/27/14 0442 11/27/14 1433 11/28/14 0547  WBC 8.4 8.8 7.7 10.1 9.7  --   HGB 7.5* 7.8* 7.7* 7.9* 7.7* 7.5*  HCT 22.7* 23.8* 23.8* 25.0* 24.2* 24.1*  MCV 79.1 82.4 83.5 82.0 82.9  --   PLT 286 232 248 274 256  --    Blood Culture    Component Value Date/Time  SDES URINE, RANDOM 11/22/2014 1740   SPECREQUEST NONE 11/22/2014 1740   CULT NO GROWTH Performed at [facility]  11/22/2014 1740   REPTSTATUS 11/23/2014 FINAL 11/22/2014 1740    Cardiac Enzymes:  Recent Labs Lab 11/22/14 1704 11/22/14 2205 11/23/14 1355  TROPONINI <0.30 <0.30 <0.30   CBG:  Recent Labs Lab 11/28/14 0754 11/28/14 1146 11/28/14 1642 11/28/14 2103 11/29/14 0814  GLUCAP 117* 85 97 193* 106*  Medications: . sodium chloride    . sodium chloride 1 mL/kg/hr (11/26/14 1445)  . sodium chloride     . sodium chloride   Intravenous Once  . sodium chloride   Intravenous Once  . [MAR Hold] bisacodyl  5 mg Oral Once  . calcium acetate  1,334 mg Oral TID WC  . carvedilol  25 mg Oral BID WC  . [START ON 12/04/2014]  darbepoetin (ARANESP) injection - DIALYSIS  60 mcg Intravenous Q Wed-HD  . docusate sodium  100 mg Oral BID  . doxercalciferol  2 mcg Intravenous Q M,W,F-HD  . ferric gluconate (FERRLECIT/NULECIT) IV  125 mg Intravenous Daily  . hydrALAZINE  50 mg Oral 3 times per day  . insulin aspart  0-5 Units Subcutaneous QHS  . insulin aspart  0-9 Units Subcutaneous TID WC  . isosorbide mononitrate  30 mg Oral Daily  . lisinopril  5 mg Oral Daily  . multivitamin  1 tablet Oral QHS  . pantoprazole  40 mg Oral Q1200  . sodium chloride  3 mL Intravenous Q12H  . sodium chloride  3 mL Intravenous Q12H  . sodium chloride  3 mL Intravenous Q12H

## 2014-11-29 NOTE — Progress Notes (Signed)
Report given to [facility], hemodialysis nurse.

## 2014-11-30 LAB — TYPE AND SCREEN
ABO/RH(D): O POS
Antibody Screen: NEGATIVE
Unit division: 0
Unit division: 0

## 2014-11-30 LAB — HEMOGLOBIN AND HEMATOCRIT, BLOOD
HCT: 25.3 % — ABNORMAL LOW (ref 39.0–52.0)
HEMOGLOBIN: 8.2 g/dL — AB (ref 13.0–17.0)

## 2014-11-30 LAB — GLUCOSE, CAPILLARY
Glucose-Capillary: 107 mg/dL — ABNORMAL HIGH (ref 70–99)
Glucose-Capillary: 111 mg/dL — ABNORMAL HIGH (ref 70–99)

## 2014-11-30 NOTE — Plan of Care (Signed)
Problem: Phase I Progression Outcomes Goal: Pt./family verbalizes plan of care Outcome: Completed/Met Date Met:  11/30/14  Problem: Phase II Progression Outcomes Goal: Discharge plan established Outcome: Completed/Met Date Met:  11/30/14 Goal: Hemodynamically stable Outcome: Completed/Met Date Met:  11/30/14

## 2014-11-30 NOTE — Progress Notes (Signed)
AVS discharge instructions were reviewed with patient. Patient was also given prescriptions for norvasc,hydralazine,protonix, lisinopril,novolog,multivitamin, imdur,calcium acetate,hydrocodone, insulin needles, and glucose monitoring kit. Patient was also given his match letter and information for dialysis. Questions that patient had were answered. Staff will assist patient to his transportation when he's ready.

## 2014-11-30 NOTE — Plan of Care (Signed)
Problem: Discharge Progression Outcomes Goal: Discharge plan in place and appropriate Outcome: Completed/Met Date Met:  11/30/14 Goal: Outpatient F/U arrangements in place Outcome: Completed/Met Date Met:  11/30/14 Goal: Medication needs addressed/assessed Outcome: Completed/Met Date Met:  11/30/14 Goal: Pain controlled with appropriate interventions Outcome: Completed/Met Date Met:  11/30/14 Goal: Tolerating diet Outcome: Adequate for Discharge Goal: Activity appropriate for discharge plan Outcome: Adequate for Discharge  Problem: Phase II Progression Outcomes Goal: Discharge plan in place and appropriate Outcome: Completed/Met Date Met:  11/30/14 Goal: Activity appropriate for discharge plan Outcome: Adequate for Discharge

## 2014-12-05 ENCOUNTER — Encounter (HOSPITAL_COMMUNITY): Payer: Self-pay | Admitting: Cardiology

## 2014-12-06 ENCOUNTER — Encounter: Payer: Self-pay | Admitting: Gastroenterology

## 2014-12-24 ENCOUNTER — Telehealth: Payer: Self-pay | Admitting: Cardiology

## 2014-12-24 NOTE — Telephone Encounter (Signed)
Closed encounter °

## 2014-12-24 NOTE — Telephone Encounter (Signed)
Records received from [place] (Dr [name]) for appointment with Dr Stanford Breed on 01/30/15.  Records given to [facility] (medical records) for Dr Jacalyn Lefevre schedule on 01/30/15.  lp

## 2015-01-08 ENCOUNTER — Telehealth: Payer: Self-pay | Admitting: Cardiology

## 2015-01-08 NOTE — Telephone Encounter (Signed)
Closed encounter °

## 2015-01-09 ENCOUNTER — Encounter: Payer: Self-pay | Admitting: Vascular Surgery

## 2015-01-10 ENCOUNTER — Encounter: Payer: Self-pay | Admitting: Vascular Surgery

## 2015-01-10 ENCOUNTER — Ambulatory Visit (INDEPENDENT_AMBULATORY_CARE_PROVIDER_SITE_OTHER): Payer: Self-pay | Admitting: Vascular Surgery

## 2015-01-10 VITALS — BP 130/69 | HR 91 | Ht 73.0 in | Wt 188.0 lb

## 2015-01-10 DIAGNOSIS — Z992 Dependence on renal dialysis: Secondary | ICD-10-CM

## 2015-01-10 DIAGNOSIS — N186 End stage renal disease: Secondary | ICD-10-CM

## 2015-01-10 NOTE — Progress Notes (Signed)
    Postoperative Access Visit   History of Present Illness  [name] is a 64 y.o. year old male who presents for postoperative follow-up for: L BC AVF (Date: 11/27/14).  The patient's wounds are healed.  The patient notes no steal symptoms.  The patient is able to complete their activities of daily living.  The patient's current symptoms are: none.  For VQI Use Only  PRE-ADM LIVING: Home  AMB STATUS: Ambulatory  Physical Examination Filed Vitals:   01/10/15 0844  BP: 130/69  Pulse: 91    RUE: Incision is healed, skin feels warm, hand grip is 5/5, sensation in digits is intact, palpable thrill, bruit can be auscultated   Medical Decision Making  [name] is a 64 y.o. year old male who presents s/p L BC AVF.  The patient's access is ready for use.  The patient's tunneled dialysis catheter can be removed after two successful cannulations and completed dialysis treatments.  Thank you for allowing [place] to participate in this patient's care.  Adele Barthel, MD [facility] Office: [phone] Pager: [phone]  01/10/2015, 9:11 AM

## 2015-01-27 DIAGNOSIS — D631 Anemia in chronic kidney disease: Secondary | ICD-10-CM | POA: Diagnosis not present

## 2015-01-27 DIAGNOSIS — N186 End stage renal disease: Secondary | ICD-10-CM | POA: Diagnosis not present

## 2015-01-27 DIAGNOSIS — N2581 Secondary hyperparathyroidism of renal origin: Secondary | ICD-10-CM | POA: Diagnosis not present

## 2015-01-28 NOTE — Progress Notes (Signed)
HPI: follow-up congestive heart failure. Recently admitted with congestive heart failure. Found to have a hemoglobin of 5.3 and creatinine 13. Initiated on dialysis. Echocardiogram November 2015 showed an ejection fraction of 20-25%, restrictive filling, moderate to severe tricuspid regurgitation, moderately reduced RV function, severe pulmonary hypertension and biatrial enlargement. Cardiac catheterization December 2015 showed no significant coronary disease. 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 Patient was placed on medications and also underwent dialysis. Significant improvement. EGD and colonoscopy showed gastritis. Also with supraventricular tachycardia treated with beta blockade. Since discharge, patient denies dyspnea, chest pain, palpitations or syncope.  Current Outpatient Prescriptions  Medication Sig Dispense Refill  . amLODipine (NORVASC) 10 MG tablet Take 1 tablet (10 mg total) by mouth daily. 30 tablet 0  . calcium acetate (PHOSLO) 667 MG capsule Take 2 capsules (1,334 mg total) by mouth 3 (three) times daily with meals. 90 capsule 0  . glucose monitoring kit (FREESTYLE) monitoring kit 1 each by Does not apply route 4 (four) times daily - after meals and at bedtime. 1 month Diabetic Testing Supplies for QAC-QHS accuchecks.Any brand OK 1 each 1  . hydrALAZINE (APRESOLINE) 50 MG tablet Take 1 tablet (50 mg total) by mouth every 8 (eight) hours. 90 tablet 0  . HYDROcodone-acetaminophen (NORCO/VICODIN) 5-325 MG per tablet Take 1-2 tablets by mouth every 6 (six) hours as needed for moderate pain. 15 tablet 0  . insulin aspart (NOVOLOG) 100 UNIT/ML injection Before each meal 3 times a day, 140-199 - 2 units, 200-250 - 4 units, 251-299 - 6 units,  300-349 - 8 units,  350 or above 10 units. Insulin PEN if approved, provide syringes and needles if needed. 10 mL 11  . Insulin Syringe-Needle U-100 25G X 1" 1 ML MISC Any brand, for 4 times a day insulin SQ, 1 month supply. 30 each 0    . isosorbide mononitrate (IMDUR) 30 MG 24 hr tablet Take 1 tablet (30 mg total) by mouth daily. 30 tablet 0  . lisinopril (PRINIVIL,ZESTRIL) 5 MG tablet Take 1 tablet (5 mg total) by mouth daily. 30 tablet 0  . multivitamin (RENA-VIT) TABS tablet Take 1 tablet by mouth at bedtime. 30 each 0  . pantoprazole (PROTONIX) 40 MG tablet Take 1 tablet (40 mg total) by mouth daily at 12 noon. 30 tablet 2   No current facility-administered medications for this visit.    Allergies  Allergen Reactions  . Penicillins Hives and Swelling     Past Medical History  Diagnosis Date  . Diabetes mellitus   . HTN (hypertension)   . Osteomyelitis   . ESRD (end stage renal disease)   . Cardiomyopathy     Past Surgical History  Procedure Laterality Date  . Left 4th toe amputation    . Cholecystectomy    . Tonsillectomy    . I&d extremity  01/28/2012    Procedure: IRRIGATION AND DEBRIDEMENT EXTREMITY;  Surgeon: Paulene Floor, MD;  Location: [facility];  Service: Orthopedics;  Laterality: Left;  irrigation and drainage left hand  . I&d extremity  01/30/2012    Procedure: IRRIGATION AND DEBRIDEMENT EXTREMITY;  Surgeon: Paulene Floor, MD;  Location: [facility];  Service: Orthopedics;  Laterality: Left;  flexor teno synovectomy and extensor tenosynovectomy arthrosynovectomy wristjoint  . I&d extremity  02/01/2012    Procedure: IRRIGATION AND DEBRIDEMENT EXTREMITY;  Surgeon: Paulene Floor, MD;  Location: [facility];  Service: Orthopedics;  Laterality: Left;  Left Wrist, Hand and Forearm  .  Colonoscopy N/A 11/28/2014    Procedure: COLONOSCOPY;  Surgeon: Ladene Artist, MD;  Location: [facility] ENDOSCOPY;  Service: Endoscopy;  Laterality: N/A;  . Esophagogastroduodenoscopy N/A 11/28/2014    Procedure: ESOPHAGOGASTRODUODENOSCOPY (EGD);  Surgeon: Ladene Artist, MD;  Location: [facility] ENDOSCOPY;  Service: Endoscopy;  Laterality: N/A;  . Av fistula placement Left 11/27/2014    Procedure: ARTERIOVENOUS (AV) FISTULA  CREATION;  Surgeon: Conrad Dyer, MD;  Location: [place];  Service: Vascular;  Laterality: Left;  . Left and right heart catheterization with coronary angiogram N/A 11/26/2014    Procedure: LEFT AND RIGHT HEART CATHETERIZATION WITH CORONARY ANGIOGRAM;  Surgeon: Leonie Man, MD;  Location: [facility] CATH LAB;  Service: Cardiovascular;  Laterality: N/A;    History   Social History  . Marital Status: Married    Spouse Name: [name]    Number of Children: 3  . Years of Education: 16   Occupational History  . Maintenance Technician    Social History Main Topics  . Smoking status: Former Smoker -- 0.50 packs/day for 4 years    Types: Cigarettes  . Smokeless tobacco: Never Used  . Alcohol Use: No  . Drug Use: No  . Sexual Activity: Not on file   Other Topics Concern  . Not on file   Social History Narrative    Family History  Problem Relation Age of Onset  . Heart disease Mother   . Diabetes Father   . Diabetes Brother   . Diabetes Sister   . Diabetes Brother   . Diabetes Brother     ROS: no fevers or chills, productive cough, hemoptysis, dysphasia, odynophagia, melena, hematochezia, dysuria, hematuria, rash, seizure activity, orthopnea, PND, pedal edema, claudication. Remaining systems are negative.  Physical Exam:   Blood pressure 130/60, pulse 70, height '6\' 1"'  (1.854 m), weight 187 lb (84.823 kg).  General:  Well developed/well nourished in NAD Skin warm/dry Patient not depressed No peripheral clubbing Back-normal HEENT-normal/normal eyelids Neck supple/normal carotid upstroke bilaterally; no bruits; no JVD; no thyromegaly chest - CTA/ normal expansion CV - RRR/normal S1 and S2; no murmurs, rubs or gallops;  PMI nondisplaced Abdomen -NT/ND, no HSM, no mass, + bowel sounds, no bruit 2+ femoral pulses, no bruits Ext-no edema, chords, 2+ DP Neuro-grossly nonfocal

## 2015-01-29 DIAGNOSIS — D631 Anemia in chronic kidney disease: Secondary | ICD-10-CM | POA: Diagnosis not present

## 2015-01-29 DIAGNOSIS — N186 End stage renal disease: Secondary | ICD-10-CM | POA: Diagnosis not present

## 2015-01-29 DIAGNOSIS — N2581 Secondary hyperparathyroidism of renal origin: Secondary | ICD-10-CM | POA: Diagnosis not present

## 2015-01-30 ENCOUNTER — Ambulatory Visit (INDEPENDENT_AMBULATORY_CARE_PROVIDER_SITE_OTHER): Payer: Self-pay | Admitting: Cardiology

## 2015-01-30 ENCOUNTER — Telehealth: Payer: Self-pay | Admitting: Cardiology

## 2015-01-30 ENCOUNTER — Encounter: Payer: Self-pay | Admitting: Cardiology

## 2015-01-30 VITALS — BP 130/60 | HR 70 | Ht 73.0 in | Wt 187.0 lb

## 2015-01-30 DIAGNOSIS — I10 Essential (primary) hypertension: Secondary | ICD-10-CM

## 2015-01-30 DIAGNOSIS — Z992 Dependence on renal dialysis: Secondary | ICD-10-CM

## 2015-01-30 DIAGNOSIS — N186 End stage renal disease: Secondary | ICD-10-CM

## 2015-01-30 DIAGNOSIS — I420 Dilated cardiomyopathy: Secondary | ICD-10-CM

## 2015-01-30 DIAGNOSIS — I471 Supraventricular tachycardia: Secondary | ICD-10-CM | POA: Insufficient documentation

## 2015-01-30 MED ORDER — CARVEDILOL 6.25 MG PO TABS: 6.2500 mg | ORAL_TABLET | Freq: Two times a day (BID) | ORAL | Status: DC

## 2015-01-30 NOTE — Assessment & Plan Note (Signed)
Blood pressure has improved. He is not on a beta blocker. Discontinue Norvasc and begin carvedilol 6.25 mg by mouth twice a day. Increase as tolerated by pulse and blood pressure.

## 2015-01-30 NOTE — Assessment & Plan Note (Signed)
History of SVT. Resume carvedilol 6.25 mg by mouth twice a day.

## 2015-01-30 NOTE — Telephone Encounter (Signed)
Close encounter 

## 2015-01-30 NOTE — Assessment & Plan Note (Signed)
Management per nephrology.

## 2015-01-30 NOTE — Assessment & Plan Note (Signed)
Patient has a nonischemic cardiomyopathy felt most likely to be hypertensive mediated. Continue ACE inhibitor, hydralazine/nitrates. He is not on a beta blocker. I will discontinue Norvasc. Add carvedilol 6.25 mg by mouth twice a day. Titrate beta blocker to goal dose of 25 mg twice a day. Once medications are titrated and blood pressure controlled plan repeat echocardiogram 3 months later. Hopefully LV function will have improved. Given end-stage renal disease would be a poor candidate for ICD. Check TSH.

## 2015-01-30 NOTE — Patient Instructions (Signed)
Your physician recommends that you schedule a follow-up appointment in: 4 WEEKS WITH APP TO TITRATE MEDICINE  Your physician recommends that you schedule a follow-up appointment in: 3 MONTHS WITH DR CRENSHAW  STOP AMLODIPINE  START CARVEDILOL 6.25 MG ONE TABLET TWICE DAILY  PLEASE ASK FOR LAB WORK-TSH-BE DONE A DIALYSIS AND SENT TO [place]

## 2015-01-31 DIAGNOSIS — L97519 Non-pressure chronic ulcer of other part of right foot with unspecified severity: Secondary | ICD-10-CM | POA: Diagnosis not present

## 2015-01-31 DIAGNOSIS — N186 End stage renal disease: Secondary | ICD-10-CM | POA: Diagnosis not present

## 2015-01-31 DIAGNOSIS — M79671 Pain in right foot: Secondary | ICD-10-CM | POA: Diagnosis not present

## 2015-01-31 DIAGNOSIS — L97529 Non-pressure chronic ulcer of other part of left foot with unspecified severity: Secondary | ICD-10-CM | POA: Diagnosis not present

## 2015-01-31 DIAGNOSIS — M2042 Other hammer toe(s) (acquired), left foot: Secondary | ICD-10-CM | POA: Diagnosis not present

## 2015-01-31 DIAGNOSIS — N2581 Secondary hyperparathyroidism of renal origin: Secondary | ICD-10-CM | POA: Diagnosis not present

## 2015-01-31 DIAGNOSIS — M2012 Hallux valgus (acquired), left foot: Secondary | ICD-10-CM | POA: Diagnosis not present

## 2015-01-31 DIAGNOSIS — M2011 Hallux valgus (acquired), right foot: Secondary | ICD-10-CM | POA: Diagnosis not present

## 2015-01-31 DIAGNOSIS — M2041 Other hammer toe(s) (acquired), right foot: Secondary | ICD-10-CM | POA: Diagnosis not present

## 2015-01-31 DIAGNOSIS — M79672 Pain in left foot: Secondary | ICD-10-CM | POA: Diagnosis not present

## 2015-01-31 DIAGNOSIS — D631 Anemia in chronic kidney disease: Secondary | ICD-10-CM | POA: Diagnosis not present

## 2015-02-03 DIAGNOSIS — D631 Anemia in chronic kidney disease: Secondary | ICD-10-CM | POA: Diagnosis not present

## 2015-02-03 DIAGNOSIS — N2581 Secondary hyperparathyroidism of renal origin: Secondary | ICD-10-CM | POA: Diagnosis not present

## 2015-02-03 DIAGNOSIS — N186 End stage renal disease: Secondary | ICD-10-CM | POA: Diagnosis not present

## 2015-02-05 DIAGNOSIS — D631 Anemia in chronic kidney disease: Secondary | ICD-10-CM | POA: Diagnosis not present

## 2015-02-05 DIAGNOSIS — N2581 Secondary hyperparathyroidism of renal origin: Secondary | ICD-10-CM | POA: Diagnosis not present

## 2015-02-05 DIAGNOSIS — N186 End stage renal disease: Secondary | ICD-10-CM | POA: Diagnosis not present

## 2015-02-06 DIAGNOSIS — L97529 Non-pressure chronic ulcer of other part of left foot with unspecified severity: Secondary | ICD-10-CM | POA: Diagnosis not present

## 2015-02-07 ENCOUNTER — Telehealth: Payer: Self-pay | Admitting: Physician Assistant

## 2015-02-07 DIAGNOSIS — D631 Anemia in chronic kidney disease: Secondary | ICD-10-CM | POA: Diagnosis not present

## 2015-02-07 DIAGNOSIS — N186 End stage renal disease: Secondary | ICD-10-CM | POA: Diagnosis not present

## 2015-02-07 DIAGNOSIS — N2581 Secondary hyperparathyroidism of renal origin: Secondary | ICD-10-CM | POA: Diagnosis not present

## 2015-02-07 NOTE — Telephone Encounter (Signed)
Received notes from Dr Erskine Emery for appointment with [name] Fuller, PA on 02/28/15.  Records given to [employer] (medical records) for Bryan's schedule on 02/28/15.  lp

## 2015-02-10 DIAGNOSIS — N2581 Secondary hyperparathyroidism of renal origin: Secondary | ICD-10-CM | POA: Diagnosis not present

## 2015-02-10 DIAGNOSIS — N186 End stage renal disease: Secondary | ICD-10-CM | POA: Diagnosis not present

## 2015-02-10 DIAGNOSIS — D631 Anemia in chronic kidney disease: Secondary | ICD-10-CM | POA: Diagnosis not present

## 2015-02-10 NOTE — Telephone Encounter (Signed)
Closed encounter °

## 2015-02-12 DIAGNOSIS — N186 End stage renal disease: Secondary | ICD-10-CM | POA: Diagnosis not present

## 2015-02-12 DIAGNOSIS — N2581 Secondary hyperparathyroidism of renal origin: Secondary | ICD-10-CM | POA: Diagnosis not present

## 2015-02-12 DIAGNOSIS — D631 Anemia in chronic kidney disease: Secondary | ICD-10-CM | POA: Diagnosis not present

## 2015-02-13 DIAGNOSIS — L97529 Non-pressure chronic ulcer of other part of left foot with unspecified severity: Secondary | ICD-10-CM | POA: Diagnosis not present

## 2015-02-13 DIAGNOSIS — L97519 Non-pressure chronic ulcer of other part of right foot with unspecified severity: Secondary | ICD-10-CM | POA: Diagnosis not present

## 2015-02-14 DIAGNOSIS — N2581 Secondary hyperparathyroidism of renal origin: Secondary | ICD-10-CM | POA: Diagnosis not present

## 2015-02-14 DIAGNOSIS — D631 Anemia in chronic kidney disease: Secondary | ICD-10-CM | POA: Diagnosis not present

## 2015-02-14 DIAGNOSIS — N186 End stage renal disease: Secondary | ICD-10-CM | POA: Diagnosis not present

## 2015-02-17 ENCOUNTER — Telehealth: Payer: Self-pay | Admitting: Physician Assistant

## 2015-02-17 DIAGNOSIS — N2581 Secondary hyperparathyroidism of renal origin: Secondary | ICD-10-CM | POA: Diagnosis not present

## 2015-02-17 DIAGNOSIS — D631 Anemia in chronic kidney disease: Secondary | ICD-10-CM | POA: Diagnosis not present

## 2015-02-17 DIAGNOSIS — N186 End stage renal disease: Secondary | ICD-10-CM | POA: Diagnosis not present

## 2015-02-18 NOTE — Telephone Encounter (Signed)
Close encounter 

## 2015-02-19 DIAGNOSIS — N186 End stage renal disease: Secondary | ICD-10-CM | POA: Diagnosis not present

## 2015-02-19 DIAGNOSIS — N2581 Secondary hyperparathyroidism of renal origin: Secondary | ICD-10-CM | POA: Diagnosis not present

## 2015-02-19 DIAGNOSIS — D631 Anemia in chronic kidney disease: Secondary | ICD-10-CM | POA: Diagnosis not present

## 2015-02-21 DIAGNOSIS — N2581 Secondary hyperparathyroidism of renal origin: Secondary | ICD-10-CM | POA: Diagnosis not present

## 2015-02-21 DIAGNOSIS — D631 Anemia in chronic kidney disease: Secondary | ICD-10-CM | POA: Diagnosis not present

## 2015-02-21 DIAGNOSIS — N186 End stage renal disease: Secondary | ICD-10-CM | POA: Diagnosis not present

## 2015-02-24 DIAGNOSIS — N2581 Secondary hyperparathyroidism of renal origin: Secondary | ICD-10-CM | POA: Diagnosis not present

## 2015-02-24 DIAGNOSIS — N186 End stage renal disease: Secondary | ICD-10-CM | POA: Diagnosis not present

## 2015-02-24 DIAGNOSIS — D631 Anemia in chronic kidney disease: Secondary | ICD-10-CM | POA: Diagnosis not present

## 2015-02-24 DIAGNOSIS — Z992 Dependence on renal dialysis: Secondary | ICD-10-CM | POA: Diagnosis not present

## 2015-02-26 DIAGNOSIS — N186 End stage renal disease: Secondary | ICD-10-CM | POA: Diagnosis not present

## 2015-02-26 DIAGNOSIS — N2581 Secondary hyperparathyroidism of renal origin: Secondary | ICD-10-CM | POA: Diagnosis not present

## 2015-02-26 DIAGNOSIS — E1121 Type 2 diabetes mellitus with diabetic nephropathy: Secondary | ICD-10-CM | POA: Diagnosis not present

## 2015-02-26 DIAGNOSIS — Z23 Encounter for immunization: Secondary | ICD-10-CM | POA: Diagnosis not present

## 2015-02-27 DIAGNOSIS — L97529 Non-pressure chronic ulcer of other part of left foot with unspecified severity: Secondary | ICD-10-CM | POA: Diagnosis not present

## 2015-02-27 DIAGNOSIS — L97519 Non-pressure chronic ulcer of other part of right foot with unspecified severity: Secondary | ICD-10-CM | POA: Diagnosis not present

## 2015-02-28 ENCOUNTER — Ambulatory Visit: Payer: Self-pay | Admitting: Physician Assistant

## 2015-02-28 DIAGNOSIS — N2581 Secondary hyperparathyroidism of renal origin: Secondary | ICD-10-CM | POA: Diagnosis not present

## 2015-02-28 DIAGNOSIS — Z23 Encounter for immunization: Secondary | ICD-10-CM | POA: Diagnosis not present

## 2015-02-28 DIAGNOSIS — E1121 Type 2 diabetes mellitus with diabetic nephropathy: Secondary | ICD-10-CM | POA: Diagnosis not present

## 2015-02-28 DIAGNOSIS — N186 End stage renal disease: Secondary | ICD-10-CM | POA: Diagnosis not present

## 2015-03-03 DIAGNOSIS — N2581 Secondary hyperparathyroidism of renal origin: Secondary | ICD-10-CM | POA: Diagnosis not present

## 2015-03-03 DIAGNOSIS — N186 End stage renal disease: Secondary | ICD-10-CM | POA: Diagnosis not present

## 2015-03-03 DIAGNOSIS — Z23 Encounter for immunization: Secondary | ICD-10-CM | POA: Diagnosis not present

## 2015-03-03 DIAGNOSIS — E1121 Type 2 diabetes mellitus with diabetic nephropathy: Secondary | ICD-10-CM | POA: Diagnosis not present

## 2015-03-05 DIAGNOSIS — N186 End stage renal disease: Secondary | ICD-10-CM | POA: Diagnosis not present

## 2015-03-05 DIAGNOSIS — E1121 Type 2 diabetes mellitus with diabetic nephropathy: Secondary | ICD-10-CM | POA: Diagnosis not present

## 2015-03-05 DIAGNOSIS — Z23 Encounter for immunization: Secondary | ICD-10-CM | POA: Diagnosis not present

## 2015-03-05 DIAGNOSIS — N2581 Secondary hyperparathyroidism of renal origin: Secondary | ICD-10-CM | POA: Diagnosis not present

## 2015-03-07 DIAGNOSIS — Z23 Encounter for immunization: Secondary | ICD-10-CM | POA: Diagnosis not present

## 2015-03-07 DIAGNOSIS — N186 End stage renal disease: Secondary | ICD-10-CM | POA: Diagnosis not present

## 2015-03-07 DIAGNOSIS — N2581 Secondary hyperparathyroidism of renal origin: Secondary | ICD-10-CM | POA: Diagnosis not present

## 2015-03-07 DIAGNOSIS — E1121 Type 2 diabetes mellitus with diabetic nephropathy: Secondary | ICD-10-CM | POA: Diagnosis not present

## 2015-03-10 DIAGNOSIS — Z23 Encounter for immunization: Secondary | ICD-10-CM | POA: Diagnosis not present

## 2015-03-10 DIAGNOSIS — N2581 Secondary hyperparathyroidism of renal origin: Secondary | ICD-10-CM | POA: Diagnosis not present

## 2015-03-10 DIAGNOSIS — N186 End stage renal disease: Secondary | ICD-10-CM | POA: Diagnosis not present

## 2015-03-10 DIAGNOSIS — E1121 Type 2 diabetes mellitus with diabetic nephropathy: Secondary | ICD-10-CM | POA: Diagnosis not present

## 2015-03-11 ENCOUNTER — Ambulatory Visit: Payer: Self-pay | Admitting: Gastroenterology

## 2015-03-12 DIAGNOSIS — N2581 Secondary hyperparathyroidism of renal origin: Secondary | ICD-10-CM | POA: Diagnosis not present

## 2015-03-12 DIAGNOSIS — E1121 Type 2 diabetes mellitus with diabetic nephropathy: Secondary | ICD-10-CM | POA: Diagnosis not present

## 2015-03-12 DIAGNOSIS — Z23 Encounter for immunization: Secondary | ICD-10-CM | POA: Diagnosis not present

## 2015-03-12 DIAGNOSIS — N186 End stage renal disease: Secondary | ICD-10-CM | POA: Diagnosis not present

## 2015-03-13 DIAGNOSIS — N185 Chronic kidney disease, stage 5: Secondary | ICD-10-CM | POA: Diagnosis not present

## 2015-03-13 DIAGNOSIS — E1140 Type 2 diabetes mellitus with diabetic neuropathy, unspecified: Secondary | ICD-10-CM | POA: Diagnosis not present

## 2015-03-13 DIAGNOSIS — E780 Pure hypercholesterolemia: Secondary | ICD-10-CM | POA: Diagnosis not present

## 2015-03-13 DIAGNOSIS — I10 Essential (primary) hypertension: Secondary | ICD-10-CM | POA: Diagnosis not present

## 2015-03-14 ENCOUNTER — Ambulatory Visit: Payer: Self-pay | Admitting: Physician Assistant

## 2015-03-14 DIAGNOSIS — E1121 Type 2 diabetes mellitus with diabetic nephropathy: Secondary | ICD-10-CM | POA: Diagnosis not present

## 2015-03-14 DIAGNOSIS — N186 End stage renal disease: Secondary | ICD-10-CM | POA: Diagnosis not present

## 2015-03-14 DIAGNOSIS — N2581 Secondary hyperparathyroidism of renal origin: Secondary | ICD-10-CM | POA: Diagnosis not present

## 2015-03-14 DIAGNOSIS — Z23 Encounter for immunization: Secondary | ICD-10-CM | POA: Diagnosis not present

## 2015-03-17 DIAGNOSIS — Z23 Encounter for immunization: Secondary | ICD-10-CM | POA: Diagnosis not present

## 2015-03-17 DIAGNOSIS — N2581 Secondary hyperparathyroidism of renal origin: Secondary | ICD-10-CM | POA: Diagnosis not present

## 2015-03-17 DIAGNOSIS — E1121 Type 2 diabetes mellitus with diabetic nephropathy: Secondary | ICD-10-CM | POA: Diagnosis not present

## 2015-03-17 DIAGNOSIS — N186 End stage renal disease: Secondary | ICD-10-CM | POA: Diagnosis not present

## 2015-03-19 DIAGNOSIS — E1121 Type 2 diabetes mellitus with diabetic nephropathy: Secondary | ICD-10-CM | POA: Diagnosis not present

## 2015-03-19 DIAGNOSIS — Z23 Encounter for immunization: Secondary | ICD-10-CM | POA: Diagnosis not present

## 2015-03-19 DIAGNOSIS — N186 End stage renal disease: Secondary | ICD-10-CM | POA: Diagnosis not present

## 2015-03-19 DIAGNOSIS — N2581 Secondary hyperparathyroidism of renal origin: Secondary | ICD-10-CM | POA: Diagnosis not present

## 2015-03-21 DIAGNOSIS — N2581 Secondary hyperparathyroidism of renal origin: Secondary | ICD-10-CM | POA: Diagnosis not present

## 2015-03-21 DIAGNOSIS — Z23 Encounter for immunization: Secondary | ICD-10-CM | POA: Diagnosis not present

## 2015-03-21 DIAGNOSIS — N186 End stage renal disease: Secondary | ICD-10-CM | POA: Diagnosis not present

## 2015-03-21 DIAGNOSIS — E1121 Type 2 diabetes mellitus with diabetic nephropathy: Secondary | ICD-10-CM | POA: Diagnosis not present

## 2015-03-24 DIAGNOSIS — N186 End stage renal disease: Secondary | ICD-10-CM | POA: Diagnosis not present

## 2015-03-24 DIAGNOSIS — E1121 Type 2 diabetes mellitus with diabetic nephropathy: Secondary | ICD-10-CM | POA: Diagnosis not present

## 2015-03-24 DIAGNOSIS — N2581 Secondary hyperparathyroidism of renal origin: Secondary | ICD-10-CM | POA: Diagnosis not present

## 2015-03-24 DIAGNOSIS — Z23 Encounter for immunization: Secondary | ICD-10-CM | POA: Diagnosis not present

## 2015-03-26 DIAGNOSIS — N186 End stage renal disease: Secondary | ICD-10-CM | POA: Diagnosis not present

## 2015-03-26 DIAGNOSIS — E1121 Type 2 diabetes mellitus with diabetic nephropathy: Secondary | ICD-10-CM | POA: Diagnosis not present

## 2015-03-26 DIAGNOSIS — N2581 Secondary hyperparathyroidism of renal origin: Secondary | ICD-10-CM | POA: Diagnosis not present

## 2015-03-26 DIAGNOSIS — Z23 Encounter for immunization: Secondary | ICD-10-CM | POA: Diagnosis not present

## 2015-03-27 DIAGNOSIS — N186 End stage renal disease: Secondary | ICD-10-CM | POA: Diagnosis not present

## 2015-03-27 DIAGNOSIS — Z992 Dependence on renal dialysis: Secondary | ICD-10-CM | POA: Diagnosis not present

## 2015-03-27 DIAGNOSIS — Z452 Encounter for adjustment and management of vascular access device: Secondary | ICD-10-CM | POA: Diagnosis not present

## 2015-03-28 DIAGNOSIS — N2581 Secondary hyperparathyroidism of renal origin: Secondary | ICD-10-CM | POA: Diagnosis not present

## 2015-03-28 DIAGNOSIS — Z23 Encounter for immunization: Secondary | ICD-10-CM | POA: Diagnosis not present

## 2015-03-28 DIAGNOSIS — D631 Anemia in chronic kidney disease: Secondary | ICD-10-CM | POA: Diagnosis not present

## 2015-03-28 DIAGNOSIS — N186 End stage renal disease: Secondary | ICD-10-CM | POA: Diagnosis not present

## 2015-03-31 DIAGNOSIS — Z23 Encounter for immunization: Secondary | ICD-10-CM | POA: Diagnosis not present

## 2015-03-31 DIAGNOSIS — D631 Anemia in chronic kidney disease: Secondary | ICD-10-CM | POA: Diagnosis not present

## 2015-03-31 DIAGNOSIS — N2581 Secondary hyperparathyroidism of renal origin: Secondary | ICD-10-CM | POA: Diagnosis not present

## 2015-03-31 DIAGNOSIS — N186 End stage renal disease: Secondary | ICD-10-CM | POA: Diagnosis not present

## 2015-04-02 DIAGNOSIS — N186 End stage renal disease: Secondary | ICD-10-CM | POA: Diagnosis not present

## 2015-04-02 DIAGNOSIS — D631 Anemia in chronic kidney disease: Secondary | ICD-10-CM | POA: Diagnosis not present

## 2015-04-02 DIAGNOSIS — Z23 Encounter for immunization: Secondary | ICD-10-CM | POA: Diagnosis not present

## 2015-04-02 DIAGNOSIS — N2581 Secondary hyperparathyroidism of renal origin: Secondary | ICD-10-CM | POA: Diagnosis not present

## 2015-04-04 DIAGNOSIS — D631 Anemia in chronic kidney disease: Secondary | ICD-10-CM | POA: Diagnosis not present

## 2015-04-04 DIAGNOSIS — N186 End stage renal disease: Secondary | ICD-10-CM | POA: Diagnosis not present

## 2015-04-04 DIAGNOSIS — Z23 Encounter for immunization: Secondary | ICD-10-CM | POA: Diagnosis not present

## 2015-04-04 DIAGNOSIS — N2581 Secondary hyperparathyroidism of renal origin: Secondary | ICD-10-CM | POA: Diagnosis not present

## 2015-04-05 DIAGNOSIS — N186 End stage renal disease: Secondary | ICD-10-CM | POA: Diagnosis not present

## 2015-04-05 DIAGNOSIS — Z23 Encounter for immunization: Secondary | ICD-10-CM | POA: Diagnosis not present

## 2015-04-05 DIAGNOSIS — N2581 Secondary hyperparathyroidism of renal origin: Secondary | ICD-10-CM | POA: Diagnosis not present

## 2015-04-05 DIAGNOSIS — D631 Anemia in chronic kidney disease: Secondary | ICD-10-CM | POA: Diagnosis not present

## 2015-04-07 DIAGNOSIS — Z23 Encounter for immunization: Secondary | ICD-10-CM | POA: Diagnosis not present

## 2015-04-07 DIAGNOSIS — N2581 Secondary hyperparathyroidism of renal origin: Secondary | ICD-10-CM | POA: Diagnosis not present

## 2015-04-07 DIAGNOSIS — D631 Anemia in chronic kidney disease: Secondary | ICD-10-CM | POA: Diagnosis not present

## 2015-04-07 DIAGNOSIS — N186 End stage renal disease: Secondary | ICD-10-CM | POA: Diagnosis not present

## 2015-04-09 DIAGNOSIS — N186 End stage renal disease: Secondary | ICD-10-CM | POA: Diagnosis not present

## 2015-04-09 DIAGNOSIS — Z23 Encounter for immunization: Secondary | ICD-10-CM | POA: Diagnosis not present

## 2015-04-09 DIAGNOSIS — N2581 Secondary hyperparathyroidism of renal origin: Secondary | ICD-10-CM | POA: Diagnosis not present

## 2015-04-09 DIAGNOSIS — D631 Anemia in chronic kidney disease: Secondary | ICD-10-CM | POA: Diagnosis not present

## 2015-04-11 DIAGNOSIS — Z23 Encounter for immunization: Secondary | ICD-10-CM | POA: Diagnosis not present

## 2015-04-11 DIAGNOSIS — D631 Anemia in chronic kidney disease: Secondary | ICD-10-CM | POA: Diagnosis not present

## 2015-04-11 DIAGNOSIS — N2581 Secondary hyperparathyroidism of renal origin: Secondary | ICD-10-CM | POA: Diagnosis not present

## 2015-04-11 DIAGNOSIS — N186 End stage renal disease: Secondary | ICD-10-CM | POA: Diagnosis not present

## 2015-04-14 DIAGNOSIS — N186 End stage renal disease: Secondary | ICD-10-CM | POA: Diagnosis not present

## 2015-04-14 DIAGNOSIS — Z23 Encounter for immunization: Secondary | ICD-10-CM | POA: Diagnosis not present

## 2015-04-14 DIAGNOSIS — D631 Anemia in chronic kidney disease: Secondary | ICD-10-CM | POA: Diagnosis not present

## 2015-04-14 DIAGNOSIS — N2581 Secondary hyperparathyroidism of renal origin: Secondary | ICD-10-CM | POA: Diagnosis not present

## 2015-04-16 DIAGNOSIS — N2581 Secondary hyperparathyroidism of renal origin: Secondary | ICD-10-CM | POA: Diagnosis not present

## 2015-04-16 DIAGNOSIS — Z23 Encounter for immunization: Secondary | ICD-10-CM | POA: Diagnosis not present

## 2015-04-16 DIAGNOSIS — N186 End stage renal disease: Secondary | ICD-10-CM | POA: Diagnosis not present

## 2015-04-16 DIAGNOSIS — D631 Anemia in chronic kidney disease: Secondary | ICD-10-CM | POA: Diagnosis not present

## 2015-04-18 DIAGNOSIS — N186 End stage renal disease: Secondary | ICD-10-CM | POA: Diagnosis not present

## 2015-04-18 DIAGNOSIS — N2581 Secondary hyperparathyroidism of renal origin: Secondary | ICD-10-CM | POA: Diagnosis not present

## 2015-04-18 DIAGNOSIS — D631 Anemia in chronic kidney disease: Secondary | ICD-10-CM | POA: Diagnosis not present

## 2015-04-18 DIAGNOSIS — Z23 Encounter for immunization: Secondary | ICD-10-CM | POA: Diagnosis not present

## 2015-04-21 DIAGNOSIS — Z23 Encounter for immunization: Secondary | ICD-10-CM | POA: Diagnosis not present

## 2015-04-21 DIAGNOSIS — D631 Anemia in chronic kidney disease: Secondary | ICD-10-CM | POA: Diagnosis not present

## 2015-04-21 DIAGNOSIS — N2581 Secondary hyperparathyroidism of renal origin: Secondary | ICD-10-CM | POA: Diagnosis not present

## 2015-04-21 DIAGNOSIS — N186 End stage renal disease: Secondary | ICD-10-CM | POA: Diagnosis not present

## 2015-04-23 DIAGNOSIS — N2581 Secondary hyperparathyroidism of renal origin: Secondary | ICD-10-CM | POA: Diagnosis not present

## 2015-04-23 DIAGNOSIS — E1121 Type 2 diabetes mellitus with diabetic nephropathy: Secondary | ICD-10-CM | POA: Diagnosis not present

## 2015-04-23 DIAGNOSIS — Z23 Encounter for immunization: Secondary | ICD-10-CM | POA: Diagnosis not present

## 2015-04-23 DIAGNOSIS — D631 Anemia in chronic kidney disease: Secondary | ICD-10-CM | POA: Diagnosis not present

## 2015-04-23 DIAGNOSIS — N186 End stage renal disease: Secondary | ICD-10-CM | POA: Diagnosis not present

## 2015-04-25 DIAGNOSIS — D631 Anemia in chronic kidney disease: Secondary | ICD-10-CM | POA: Diagnosis not present

## 2015-04-25 DIAGNOSIS — N186 End stage renal disease: Secondary | ICD-10-CM | POA: Diagnosis not present

## 2015-04-25 DIAGNOSIS — Z23 Encounter for immunization: Secondary | ICD-10-CM | POA: Diagnosis not present

## 2015-04-25 DIAGNOSIS — N2581 Secondary hyperparathyroidism of renal origin: Secondary | ICD-10-CM | POA: Diagnosis not present

## 2015-04-26 DIAGNOSIS — Z992 Dependence on renal dialysis: Secondary | ICD-10-CM | POA: Diagnosis not present

## 2015-04-26 DIAGNOSIS — N186 End stage renal disease: Secondary | ICD-10-CM | POA: Diagnosis not present

## 2015-04-26 DIAGNOSIS — E1122 Type 2 diabetes mellitus with diabetic chronic kidney disease: Secondary | ICD-10-CM | POA: Diagnosis not present

## 2015-04-28 DIAGNOSIS — D631 Anemia in chronic kidney disease: Secondary | ICD-10-CM | POA: Diagnosis not present

## 2015-04-28 DIAGNOSIS — N2581 Secondary hyperparathyroidism of renal origin: Secondary | ICD-10-CM | POA: Diagnosis not present

## 2015-04-28 DIAGNOSIS — D509 Iron deficiency anemia, unspecified: Secondary | ICD-10-CM | POA: Diagnosis not present

## 2015-04-28 DIAGNOSIS — N186 End stage renal disease: Secondary | ICD-10-CM | POA: Diagnosis not present

## 2015-04-30 DIAGNOSIS — D631 Anemia in chronic kidney disease: Secondary | ICD-10-CM | POA: Diagnosis not present

## 2015-04-30 DIAGNOSIS — N2581 Secondary hyperparathyroidism of renal origin: Secondary | ICD-10-CM | POA: Diagnosis not present

## 2015-04-30 DIAGNOSIS — N186 End stage renal disease: Secondary | ICD-10-CM | POA: Diagnosis not present

## 2015-04-30 DIAGNOSIS — D509 Iron deficiency anemia, unspecified: Secondary | ICD-10-CM | POA: Diagnosis not present

## 2015-05-02 DIAGNOSIS — D509 Iron deficiency anemia, unspecified: Secondary | ICD-10-CM | POA: Diagnosis not present

## 2015-05-02 DIAGNOSIS — N186 End stage renal disease: Secondary | ICD-10-CM | POA: Diagnosis not present

## 2015-05-02 DIAGNOSIS — N2581 Secondary hyperparathyroidism of renal origin: Secondary | ICD-10-CM | POA: Diagnosis not present

## 2015-05-02 DIAGNOSIS — D631 Anemia in chronic kidney disease: Secondary | ICD-10-CM | POA: Diagnosis not present

## 2015-05-05 DIAGNOSIS — N186 End stage renal disease: Secondary | ICD-10-CM | POA: Diagnosis not present

## 2015-05-05 DIAGNOSIS — D509 Iron deficiency anemia, unspecified: Secondary | ICD-10-CM | POA: Diagnosis not present

## 2015-05-05 DIAGNOSIS — N2581 Secondary hyperparathyroidism of renal origin: Secondary | ICD-10-CM | POA: Diagnosis not present

## 2015-05-05 DIAGNOSIS — D631 Anemia in chronic kidney disease: Secondary | ICD-10-CM | POA: Diagnosis not present

## 2015-05-07 DIAGNOSIS — N186 End stage renal disease: Secondary | ICD-10-CM | POA: Diagnosis not present

## 2015-05-07 DIAGNOSIS — D509 Iron deficiency anemia, unspecified: Secondary | ICD-10-CM | POA: Diagnosis not present

## 2015-05-07 DIAGNOSIS — N2581 Secondary hyperparathyroidism of renal origin: Secondary | ICD-10-CM | POA: Diagnosis not present

## 2015-05-07 DIAGNOSIS — D631 Anemia in chronic kidney disease: Secondary | ICD-10-CM | POA: Diagnosis not present

## 2015-05-09 DIAGNOSIS — N2581 Secondary hyperparathyroidism of renal origin: Secondary | ICD-10-CM | POA: Diagnosis not present

## 2015-05-09 DIAGNOSIS — N186 End stage renal disease: Secondary | ICD-10-CM | POA: Diagnosis not present

## 2015-05-09 DIAGNOSIS — D509 Iron deficiency anemia, unspecified: Secondary | ICD-10-CM | POA: Diagnosis not present

## 2015-05-09 DIAGNOSIS — D631 Anemia in chronic kidney disease: Secondary | ICD-10-CM | POA: Diagnosis not present

## 2015-05-10 NOTE — Progress Notes (Signed)
HPI:  FU congestive heart failure. Previosly admitted with congestive heart failure. Found to have a hemoglobin of 5.3 and creatinine 13. Initiated on dialysis. Echocardiogram November 2015 showed an ejection fraction of 20-25%, restrictive filling, moderate to severe tricuspid regurgitation, moderately reduced RV function, severe pulmonary hypertension and biatrial enlargement. Cardiac catheterization December 2015 showed no significant coronary disease. 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 Patient was placed on medications and also underwent dialysis. Significant improvement. EGD and colonoscopy showed gastritis. Also with supraventricular tachycardia treated with beta blockade. Since last seen, the patient denies any dyspnea on exertion, orthopnea, PND, pedal edema, palpitations, syncope or chest pain. Note he has had difficulties with hypotension on dialysis days. He has had dizziness with standing and his medications have been reduced. He is also holding medications on the morning of dialysis.   Current Outpatient Prescriptions  Medication Sig Dispense Refill  . calcium acetate (PHOSLO) 667 MG capsule Take 2 capsules (1,334 mg total) by mouth 3 (three) times daily with meals. 90 capsule 0  . carvedilol (COREG) 6.25 MG tablet Take 1 tablet (6.25 mg total) by mouth 2 (two) times daily. 180 tablet 3  . glucose monitoring kit (FREESTYLE) monitoring kit 1 each by Does not apply route 4 (four) times daily - after meals and at bedtime. 1 month Diabetic Testing Supplies for QAC-QHS accuchecks.Any brand OK 1 each 1  . hydrALAZINE (APRESOLINE) 50 MG tablet Take 1 tablet (50 mg total) by mouth every 8 (eight) hours. 90 tablet 0  . HYDROcodone-acetaminophen (NORCO/VICODIN) 5-325 MG per tablet Take 1-2 tablets by mouth every 6 (six) hours as needed for moderate pain. 15 tablet 0  . insulin aspart (NOVOLOG) 100 UNIT/ML injection Before each meal 3 times a day, 140-199 - 2 units, 200-250 - 4  units, 251-299 - 6 units,  300-349 - 8 units,  350 or above 10 units. Insulin PEN if approved, provide syringes and needles if needed. 10 mL 11  . Insulin Syringe-Needle U-100 25G X 1" 1 ML MISC Any brand, for 4 times a day insulin SQ, 1 month supply. 30 each 0  . isosorbide mononitrate (IMDUR) 30 MG 24 hr tablet Take 1 tablet (30 mg total) by mouth daily. 30 tablet 0  . lisinopril (PRINIVIL,ZESTRIL) 5 MG tablet Take 1 tablet (5 mg total) by mouth daily. 30 tablet 0  . multivitamin (RENA-VIT) TABS tablet Take 1 tablet by mouth at bedtime. 30 each 0   No current facility-administered medications for this visit.     Past Medical History  Diagnosis Date  . Diabetes mellitus   . HTN (hypertension)   . Osteomyelitis   . ESRD (end stage renal disease)   . Cardiomyopathy     Past Surgical History  Procedure Laterality Date  . Left 4th toe amputation    . Cholecystectomy    . Tonsillectomy    . I&d extremity  01/28/2012    Procedure: IRRIGATION AND DEBRIDEMENT EXTREMITY;  Surgeon: Paulene Floor, MD;  Location: [facility];  Service: Orthopedics;  Laterality: Left;  irrigation and drainage left hand  . I&d extremity  01/30/2012    Procedure: IRRIGATION AND DEBRIDEMENT EXTREMITY;  Surgeon: Paulene Floor, MD;  Location: [facility];  Service: Orthopedics;  Laterality: Left;  flexor teno synovectomy and extensor tenosynovectomy arthrosynovectomy wristjoint  . I&d extremity  02/01/2012    Procedure: IRRIGATION AND DEBRIDEMENT EXTREMITY;  Surgeon: Paulene Floor, MD;  Location: [facility];  Service: Orthopedics;  Laterality: Left;  Left Wrist, Hand and Forearm  . Colonoscopy N/A 11/28/2014    Procedure: COLONOSCOPY;  Surgeon: Ladene Artist, MD;  Location: [facility] ENDOSCOPY;  Service: Endoscopy;  Laterality: N/A;  . Esophagogastroduodenoscopy N/A 11/28/2014    Procedure: ESOPHAGOGASTRODUODENOSCOPY (EGD);  Surgeon: Ladene Artist, MD;  Location: [facility] ENDOSCOPY;  Service: Endoscopy;  Laterality: N/A;  .  Av fistula placement Left 11/27/2014    Procedure: ARTERIOVENOUS (AV) FISTULA CREATION;  Surgeon: Conrad Cole, MD;  Location: [place];  Service: Vascular;  Laterality: Left;  . Left and right heart catheterization with coronary angiogram N/A 11/26/2014    Procedure: LEFT AND RIGHT HEART CATHETERIZATION WITH CORONARY ANGIOGRAM;  Surgeon: Leonie Man, MD;  Location: [facility] CATH LAB;  Service: Cardiovascular;  Laterality: N/A;    History   Social History  . Marital Status: Married    Spouse Name: [name]  . Number of Children: 3  . Years of Education: 16   Occupational History  . Maintenance Technician    Social History Main Topics  . Smoking status: Former Smoker -- 0.50 packs/day for 4 years    Types: Cigarettes  . Smokeless tobacco: Never Used  . Alcohol Use: No  . Drug Use: No  . Sexual Activity: Not on file   Other Topics Concern  . Not on file   Social History Narrative    ROS: no fevers or chills, productive cough, hemoptysis, dysphasia, odynophagia, melena, hematochezia, dysuria, hematuria, rash, seizure activity, orthopnea, PND, pedal edema, claudication. Remaining systems are negative.  Physical Exam: Well-developed well-nourished in no acute distress.  Skin is warm and dry.  HEENT is normal.  Neck is supple.  Chest is clear to auscultation with normal expansion.  Cardiovascular exam is regular rate and rhythm.  Abdominal exam nontender or distended. No masses palpated. Extremities show no edema. neuro grossly intact  Electrocardiogram shows sinus rhythm, rate 80, IVCD, nonspecific T-wave changes.

## 2015-05-12 ENCOUNTER — Ambulatory Visit (INDEPENDENT_AMBULATORY_CARE_PROVIDER_SITE_OTHER): Payer: Medicare Other | Admitting: Cardiology

## 2015-05-12 ENCOUNTER — Encounter: Payer: Self-pay | Admitting: Cardiology

## 2015-05-12 ENCOUNTER — Encounter: Payer: Self-pay | Admitting: *Deleted

## 2015-05-12 VITALS — BP 180/82 | HR 80 | Ht 73.0 in | Wt 196.5 lb

## 2015-05-12 DIAGNOSIS — I429 Cardiomyopathy, unspecified: Secondary | ICD-10-CM

## 2015-05-12 DIAGNOSIS — D631 Anemia in chronic kidney disease: Secondary | ICD-10-CM | POA: Diagnosis not present

## 2015-05-12 DIAGNOSIS — N2581 Secondary hyperparathyroidism of renal origin: Secondary | ICD-10-CM | POA: Diagnosis not present

## 2015-05-12 DIAGNOSIS — D509 Iron deficiency anemia, unspecified: Secondary | ICD-10-CM | POA: Diagnosis not present

## 2015-05-12 DIAGNOSIS — N186 End stage renal disease: Secondary | ICD-10-CM | POA: Diagnosis not present

## 2015-05-12 DIAGNOSIS — I428 Other cardiomyopathies: Secondary | ICD-10-CM

## 2015-05-12 DIAGNOSIS — I420 Dilated cardiomyopathy: Secondary | ICD-10-CM

## 2015-05-12 MED ORDER — CARVEDILOL 12.5 MG PO TABS: 12.5000 mg | ORAL_TABLET | Freq: Two times a day (BID) | ORAL | Status: DC

## 2015-05-12 NOTE — Assessment & Plan Note (Signed)
Continue beta blocker. 

## 2015-05-12 NOTE — Assessment & Plan Note (Addendum)
Patient's cardiomyopathy previously felt secondary to hypertension. His blood pressure has improved. Continue lisinopril and carvedilol. Repeat echocardiogram to see if LV function has improved now that blood pressure is better. Check TSH.

## 2015-05-12 NOTE — Assessment & Plan Note (Signed)
Patient is having difficulties with hypotension on dialysis days. His blood pressure has decreased since dialysis was initiated. It is mildly elevated today but he does follow this routinely. I have asked him to discontinue his hydralazine, isosorbide and Norvasc. Continue lisinopril. Increase carvedilol to 12.5 mg twice a day. Follow blood pressure and adjust as needed.

## 2015-05-12 NOTE — Patient Instructions (Signed)
Your physician wants you to follow-up in: [place] will receive a reminder letter in the mail two months in advance. If you don't receive a letter, please call our office to schedule the follow-up appointment.   STOP AMLODIPINE  STOP HYDRALAZINE  STOP ISOSORBIDE  INCREASE CARVEDILOL TO 12.5 MG TWICE DAILY= 2 OF THE 6.25 MG TABLETS TWICE DAILY  Your physician has requested that you have an echocardiogram. Echocardiography is a painless test that uses sound waves to create images of your heart. It provides your doctor with information about the size and shape of your heart and how well your heart's chambers and valves are working. This procedure takes approximately one hour. There are no restrictions for this procedure.

## 2015-05-14 DIAGNOSIS — N2581 Secondary hyperparathyroidism of renal origin: Secondary | ICD-10-CM | POA: Diagnosis not present

## 2015-05-14 DIAGNOSIS — N186 End stage renal disease: Secondary | ICD-10-CM | POA: Diagnosis not present

## 2015-05-14 DIAGNOSIS — I471 Supraventricular tachycardia: Secondary | ICD-10-CM | POA: Diagnosis not present

## 2015-05-14 DIAGNOSIS — D509 Iron deficiency anemia, unspecified: Secondary | ICD-10-CM | POA: Diagnosis not present

## 2015-05-14 DIAGNOSIS — D631 Anemia in chronic kidney disease: Secondary | ICD-10-CM | POA: Diagnosis not present

## 2015-05-15 ENCOUNTER — Ambulatory Visit (HOSPITAL_COMMUNITY)
Admission: RE | Admit: 2015-05-15 | Discharge: 2015-05-15 | Disposition: A | Discharge status: Home / Self Care | Payer: Medicare Other | Source: Ambulatory Visit | Attending: Cardiology | Admitting: Cardiology

## 2015-05-15 DIAGNOSIS — I509 Heart failure, unspecified: Secondary | ICD-10-CM | POA: Diagnosis not present

## 2015-05-15 DIAGNOSIS — Z794 Long term (current) use of insulin: Secondary | ICD-10-CM | POA: Diagnosis not present

## 2015-05-15 DIAGNOSIS — Z87891 Personal history of nicotine dependence: Secondary | ICD-10-CM | POA: Diagnosis not present

## 2015-05-15 DIAGNOSIS — I429 Cardiomyopathy, unspecified: Secondary | ICD-10-CM | POA: Insufficient documentation

## 2015-05-15 DIAGNOSIS — I10 Essential (primary) hypertension: Secondary | ICD-10-CM | POA: Insufficient documentation

## 2015-05-15 DIAGNOSIS — I428 Other cardiomyopathies: Secondary | ICD-10-CM

## 2015-05-15 DIAGNOSIS — E119 Type 2 diabetes mellitus without complications: Secondary | ICD-10-CM | POA: Diagnosis not present

## 2015-05-16 ENCOUNTER — Telehealth: Payer: Self-pay | Admitting: *Deleted

## 2015-05-16 DIAGNOSIS — N2581 Secondary hyperparathyroidism of renal origin: Secondary | ICD-10-CM | POA: Diagnosis not present

## 2015-05-16 DIAGNOSIS — N186 End stage renal disease: Secondary | ICD-10-CM | POA: Diagnosis not present

## 2015-05-16 DIAGNOSIS — D631 Anemia in chronic kidney disease: Secondary | ICD-10-CM | POA: Diagnosis not present

## 2015-05-16 DIAGNOSIS — D509 Iron deficiency anemia, unspecified: Secondary | ICD-10-CM | POA: Diagnosis not present

## 2015-05-16 NOTE — Telephone Encounter (Signed)
Spoke with Patient and told him to increase his carvedilol to 18.75 mg (1 1/2 tablets) BID. Pt verbally understand.

## 2015-05-19 DIAGNOSIS — N186 End stage renal disease: Secondary | ICD-10-CM | POA: Diagnosis not present

## 2015-05-19 DIAGNOSIS — D631 Anemia in chronic kidney disease: Secondary | ICD-10-CM | POA: Diagnosis not present

## 2015-05-19 DIAGNOSIS — N2581 Secondary hyperparathyroidism of renal origin: Secondary | ICD-10-CM | POA: Diagnosis not present

## 2015-05-19 DIAGNOSIS — D509 Iron deficiency anemia, unspecified: Secondary | ICD-10-CM | POA: Diagnosis not present

## 2015-05-21 ENCOUNTER — Telehealth: Payer: Self-pay | Admitting: Cardiology

## 2015-05-21 DIAGNOSIS — D631 Anemia in chronic kidney disease: Secondary | ICD-10-CM | POA: Diagnosis not present

## 2015-05-21 DIAGNOSIS — N186 End stage renal disease: Secondary | ICD-10-CM | POA: Diagnosis not present

## 2015-05-21 DIAGNOSIS — D509 Iron deficiency anemia, unspecified: Secondary | ICD-10-CM | POA: Diagnosis not present

## 2015-05-21 DIAGNOSIS — N2581 Secondary hyperparathyroidism of renal origin: Secondary | ICD-10-CM | POA: Diagnosis not present

## 2015-05-21 NOTE — Telephone Encounter (Signed)
FORWARD TO DR CRENSHAW 

## 2015-05-21 NOTE — Telephone Encounter (Signed)
Patient states that Dr. Stanford Breed was going to speak with Dr. Kenton Kingfisher regarding patient being placed on generic Viagra.  Patient is also taking Isosorbide---are there concerns regarding this?

## 2015-05-22 NOTE — Telephone Encounter (Signed)
Spoke to Odin at Dr. Kenton Kingfisher' office, informed her of Dr. Jacalyn Lefevre advice - she verbalized understanding.

## 2015-05-22 NOTE — Telephone Encounter (Signed)
imdur was DCed at last ov; patient can have viagra from cardiac standpoint but cannot take any nitrates with this; needs to get prescription from primary care [facility]

## 2015-05-23 DIAGNOSIS — N2581 Secondary hyperparathyroidism of renal origin: Secondary | ICD-10-CM | POA: Diagnosis not present

## 2015-05-23 DIAGNOSIS — N186 End stage renal disease: Secondary | ICD-10-CM | POA: Diagnosis not present

## 2015-05-23 DIAGNOSIS — D509 Iron deficiency anemia, unspecified: Secondary | ICD-10-CM | POA: Diagnosis not present

## 2015-05-23 DIAGNOSIS — D631 Anemia in chronic kidney disease: Secondary | ICD-10-CM | POA: Diagnosis not present

## 2015-05-27 DIAGNOSIS — N186 End stage renal disease: Secondary | ICD-10-CM | POA: Diagnosis not present

## 2015-05-27 DIAGNOSIS — E1122 Type 2 diabetes mellitus with diabetic chronic kidney disease: Secondary | ICD-10-CM | POA: Diagnosis not present

## 2015-05-27 DIAGNOSIS — Z992 Dependence on renal dialysis: Secondary | ICD-10-CM | POA: Diagnosis not present

## 2015-05-28 DIAGNOSIS — D631 Anemia in chronic kidney disease: Secondary | ICD-10-CM | POA: Diagnosis not present

## 2015-05-28 DIAGNOSIS — D509 Iron deficiency anemia, unspecified: Secondary | ICD-10-CM | POA: Diagnosis not present

## 2015-05-28 DIAGNOSIS — N2581 Secondary hyperparathyroidism of renal origin: Secondary | ICD-10-CM | POA: Diagnosis not present

## 2015-05-28 DIAGNOSIS — N186 End stage renal disease: Secondary | ICD-10-CM | POA: Diagnosis not present

## 2015-05-30 DIAGNOSIS — D509 Iron deficiency anemia, unspecified: Secondary | ICD-10-CM | POA: Diagnosis not present

## 2015-05-30 DIAGNOSIS — N186 End stage renal disease: Secondary | ICD-10-CM | POA: Diagnosis not present

## 2015-05-30 DIAGNOSIS — N2581 Secondary hyperparathyroidism of renal origin: Secondary | ICD-10-CM | POA: Diagnosis not present

## 2015-06-02 DIAGNOSIS — N186 End stage renal disease: Secondary | ICD-10-CM | POA: Diagnosis not present

## 2015-06-02 DIAGNOSIS — N2581 Secondary hyperparathyroidism of renal origin: Secondary | ICD-10-CM | POA: Diagnosis not present

## 2015-06-02 DIAGNOSIS — D509 Iron deficiency anemia, unspecified: Secondary | ICD-10-CM | POA: Diagnosis not present

## 2015-06-04 DIAGNOSIS — D509 Iron deficiency anemia, unspecified: Secondary | ICD-10-CM | POA: Diagnosis not present

## 2015-06-04 DIAGNOSIS — N2581 Secondary hyperparathyroidism of renal origin: Secondary | ICD-10-CM | POA: Diagnosis not present

## 2015-06-04 DIAGNOSIS — N186 End stage renal disease: Secondary | ICD-10-CM | POA: Diagnosis not present

## 2015-06-06 DIAGNOSIS — N186 End stage renal disease: Secondary | ICD-10-CM | POA: Diagnosis not present

## 2015-06-06 DIAGNOSIS — N2581 Secondary hyperparathyroidism of renal origin: Secondary | ICD-10-CM | POA: Diagnosis not present

## 2015-06-06 DIAGNOSIS — D509 Iron deficiency anemia, unspecified: Secondary | ICD-10-CM | POA: Diagnosis not present

## 2015-06-09 DIAGNOSIS — N2581 Secondary hyperparathyroidism of renal origin: Secondary | ICD-10-CM | POA: Diagnosis not present

## 2015-06-09 DIAGNOSIS — N186 End stage renal disease: Secondary | ICD-10-CM | POA: Diagnosis not present

## 2015-06-09 DIAGNOSIS — D509 Iron deficiency anemia, unspecified: Secondary | ICD-10-CM | POA: Diagnosis not present

## 2015-06-11 DIAGNOSIS — D509 Iron deficiency anemia, unspecified: Secondary | ICD-10-CM | POA: Diagnosis not present

## 2015-06-11 DIAGNOSIS — N186 End stage renal disease: Secondary | ICD-10-CM | POA: Diagnosis not present

## 2015-06-11 DIAGNOSIS — N2581 Secondary hyperparathyroidism of renal origin: Secondary | ICD-10-CM | POA: Diagnosis not present

## 2015-06-13 DIAGNOSIS — N186 End stage renal disease: Secondary | ICD-10-CM | POA: Diagnosis not present

## 2015-06-13 DIAGNOSIS — N2581 Secondary hyperparathyroidism of renal origin: Secondary | ICD-10-CM | POA: Diagnosis not present

## 2015-06-13 DIAGNOSIS — D509 Iron deficiency anemia, unspecified: Secondary | ICD-10-CM | POA: Diagnosis not present

## 2015-06-16 DIAGNOSIS — D509 Iron deficiency anemia, unspecified: Secondary | ICD-10-CM | POA: Diagnosis not present

## 2015-06-16 DIAGNOSIS — N2581 Secondary hyperparathyroidism of renal origin: Secondary | ICD-10-CM | POA: Diagnosis not present

## 2015-06-16 DIAGNOSIS — N186 End stage renal disease: Secondary | ICD-10-CM | POA: Diagnosis not present

## 2015-06-18 DIAGNOSIS — N186 End stage renal disease: Secondary | ICD-10-CM | POA: Diagnosis not present

## 2015-06-18 DIAGNOSIS — D509 Iron deficiency anemia, unspecified: Secondary | ICD-10-CM | POA: Diagnosis not present

## 2015-06-18 DIAGNOSIS — N2581 Secondary hyperparathyroidism of renal origin: Secondary | ICD-10-CM | POA: Diagnosis not present

## 2015-06-20 DIAGNOSIS — D509 Iron deficiency anemia, unspecified: Secondary | ICD-10-CM | POA: Diagnosis not present

## 2015-06-20 DIAGNOSIS — N2581 Secondary hyperparathyroidism of renal origin: Secondary | ICD-10-CM | POA: Diagnosis not present

## 2015-06-20 DIAGNOSIS — N186 End stage renal disease: Secondary | ICD-10-CM | POA: Diagnosis not present

## 2015-06-23 DIAGNOSIS — N2581 Secondary hyperparathyroidism of renal origin: Secondary | ICD-10-CM | POA: Diagnosis not present

## 2015-06-23 DIAGNOSIS — N186 End stage renal disease: Secondary | ICD-10-CM | POA: Diagnosis not present

## 2015-06-23 DIAGNOSIS — D509 Iron deficiency anemia, unspecified: Secondary | ICD-10-CM | POA: Diagnosis not present

## 2015-06-25 DIAGNOSIS — D509 Iron deficiency anemia, unspecified: Secondary | ICD-10-CM | POA: Diagnosis not present

## 2015-06-25 DIAGNOSIS — N186 End stage renal disease: Secondary | ICD-10-CM | POA: Diagnosis not present

## 2015-06-25 DIAGNOSIS — N2581 Secondary hyperparathyroidism of renal origin: Secondary | ICD-10-CM | POA: Diagnosis not present

## 2015-06-26 DIAGNOSIS — N186 End stage renal disease: Secondary | ICD-10-CM | POA: Diagnosis not present

## 2015-06-26 DIAGNOSIS — E1122 Type 2 diabetes mellitus with diabetic chronic kidney disease: Secondary | ICD-10-CM | POA: Diagnosis not present

## 2015-06-26 DIAGNOSIS — Z992 Dependence on renal dialysis: Secondary | ICD-10-CM | POA: Diagnosis not present

## 2015-06-27 DIAGNOSIS — D631 Anemia in chronic kidney disease: Secondary | ICD-10-CM | POA: Diagnosis not present

## 2015-06-27 DIAGNOSIS — D509 Iron deficiency anemia, unspecified: Secondary | ICD-10-CM | POA: Diagnosis not present

## 2015-06-27 DIAGNOSIS — E1121 Type 2 diabetes mellitus with diabetic nephropathy: Secondary | ICD-10-CM | POA: Diagnosis not present

## 2015-06-27 DIAGNOSIS — N186 End stage renal disease: Secondary | ICD-10-CM | POA: Diagnosis not present

## 2015-06-27 DIAGNOSIS — N2581 Secondary hyperparathyroidism of renal origin: Secondary | ICD-10-CM | POA: Diagnosis not present

## 2015-07-02 DIAGNOSIS — E1121 Type 2 diabetes mellitus with diabetic nephropathy: Secondary | ICD-10-CM | POA: Diagnosis not present

## 2015-07-02 DIAGNOSIS — N186 End stage renal disease: Secondary | ICD-10-CM | POA: Diagnosis not present

## 2015-07-02 DIAGNOSIS — D631 Anemia in chronic kidney disease: Secondary | ICD-10-CM | POA: Diagnosis not present

## 2015-07-02 DIAGNOSIS — D509 Iron deficiency anemia, unspecified: Secondary | ICD-10-CM | POA: Diagnosis not present

## 2015-07-02 DIAGNOSIS — N2581 Secondary hyperparathyroidism of renal origin: Secondary | ICD-10-CM | POA: Diagnosis not present

## 2015-07-04 DIAGNOSIS — D509 Iron deficiency anemia, unspecified: Secondary | ICD-10-CM | POA: Diagnosis not present

## 2015-07-04 DIAGNOSIS — D631 Anemia in chronic kidney disease: Secondary | ICD-10-CM | POA: Diagnosis not present

## 2015-07-04 DIAGNOSIS — N2581 Secondary hyperparathyroidism of renal origin: Secondary | ICD-10-CM | POA: Diagnosis not present

## 2015-07-04 DIAGNOSIS — N186 End stage renal disease: Secondary | ICD-10-CM | POA: Diagnosis not present

## 2015-07-04 DIAGNOSIS — E1121 Type 2 diabetes mellitus with diabetic nephropathy: Secondary | ICD-10-CM | POA: Diagnosis not present

## 2015-07-07 DIAGNOSIS — N2581 Secondary hyperparathyroidism of renal origin: Secondary | ICD-10-CM | POA: Diagnosis not present

## 2015-07-07 DIAGNOSIS — E1121 Type 2 diabetes mellitus with diabetic nephropathy: Secondary | ICD-10-CM | POA: Diagnosis not present

## 2015-07-07 DIAGNOSIS — D631 Anemia in chronic kidney disease: Secondary | ICD-10-CM | POA: Diagnosis not present

## 2015-07-07 DIAGNOSIS — D509 Iron deficiency anemia, unspecified: Secondary | ICD-10-CM | POA: Diagnosis not present

## 2015-07-07 DIAGNOSIS — N186 End stage renal disease: Secondary | ICD-10-CM | POA: Diagnosis not present

## 2015-07-09 DIAGNOSIS — D509 Iron deficiency anemia, unspecified: Secondary | ICD-10-CM | POA: Diagnosis not present

## 2015-07-09 DIAGNOSIS — N186 End stage renal disease: Secondary | ICD-10-CM | POA: Diagnosis not present

## 2015-07-09 DIAGNOSIS — E1121 Type 2 diabetes mellitus with diabetic nephropathy: Secondary | ICD-10-CM | POA: Diagnosis not present

## 2015-07-09 DIAGNOSIS — D631 Anemia in chronic kidney disease: Secondary | ICD-10-CM | POA: Diagnosis not present

## 2015-07-09 DIAGNOSIS — N2581 Secondary hyperparathyroidism of renal origin: Secondary | ICD-10-CM | POA: Diagnosis not present

## 2015-07-11 ENCOUNTER — Emergency Department (HOSPITAL_COMMUNITY)
Admission: EM | Admit: 2015-07-11 | Discharge: 2015-07-11 | Disposition: A | Discharge status: Home / Self Care | Payer: Medicare Other | Attending: Emergency Medicine | Admitting: Emergency Medicine

## 2015-07-11 ENCOUNTER — Encounter (HOSPITAL_COMMUNITY): Payer: Self-pay

## 2015-07-11 ENCOUNTER — Emergency Department (HOSPITAL_COMMUNITY): Payer: Medicare Other

## 2015-07-11 DIAGNOSIS — Z794 Long term (current) use of insulin: Secondary | ICD-10-CM | POA: Diagnosis not present

## 2015-07-11 DIAGNOSIS — M869 Osteomyelitis, unspecified: Secondary | ICD-10-CM | POA: Insufficient documentation

## 2015-07-11 DIAGNOSIS — S91301A Unspecified open wound, right foot, initial encounter: Secondary | ICD-10-CM | POA: Diagnosis not present

## 2015-07-11 DIAGNOSIS — N2581 Secondary hyperparathyroidism of renal origin: Secondary | ICD-10-CM | POA: Diagnosis not present

## 2015-07-11 DIAGNOSIS — N186 End stage renal disease: Secondary | ICD-10-CM | POA: Insufficient documentation

## 2015-07-11 DIAGNOSIS — E1121 Type 2 diabetes mellitus with diabetic nephropathy: Secondary | ICD-10-CM | POA: Diagnosis not present

## 2015-07-11 DIAGNOSIS — I120 Hypertensive chronic kidney disease with stage 5 chronic kidney disease or end stage renal disease: Secondary | ICD-10-CM | POA: Diagnosis not present

## 2015-07-11 DIAGNOSIS — Z880 Allergy status to penicillin: Secondary | ICD-10-CM | POA: Insufficient documentation

## 2015-07-11 DIAGNOSIS — E11621 Type 2 diabetes mellitus with foot ulcer: Secondary | ICD-10-CM | POA: Insufficient documentation

## 2015-07-11 DIAGNOSIS — E08621 Diabetes mellitus due to underlying condition with foot ulcer: Secondary | ICD-10-CM

## 2015-07-11 DIAGNOSIS — Z79899 Other long term (current) drug therapy: Secondary | ICD-10-CM | POA: Insufficient documentation

## 2015-07-11 DIAGNOSIS — L97419 Non-pressure chronic ulcer of right heel and midfoot with unspecified severity: Secondary | ICD-10-CM | POA: Insufficient documentation

## 2015-07-11 DIAGNOSIS — Z87891 Personal history of nicotine dependence: Secondary | ICD-10-CM | POA: Insufficient documentation

## 2015-07-11 DIAGNOSIS — Z992 Dependence on renal dialysis: Secondary | ICD-10-CM | POA: Insufficient documentation

## 2015-07-11 DIAGNOSIS — D631 Anemia in chronic kidney disease: Secondary | ICD-10-CM | POA: Diagnosis not present

## 2015-07-11 DIAGNOSIS — L97429 Non-pressure chronic ulcer of left heel and midfoot with unspecified severity: Secondary | ICD-10-CM | POA: Diagnosis not present

## 2015-07-11 DIAGNOSIS — S92322A Displaced fracture of second metatarsal bone, left foot, initial encounter for closed fracture: Secondary | ICD-10-CM | POA: Diagnosis not present

## 2015-07-11 DIAGNOSIS — L97529 Non-pressure chronic ulcer of other part of left foot with unspecified severity: Secondary | ICD-10-CM

## 2015-07-11 DIAGNOSIS — D509 Iron deficiency anemia, unspecified: Secondary | ICD-10-CM | POA: Diagnosis not present

## 2015-07-11 DIAGNOSIS — L97509 Non-pressure chronic ulcer of other part of unspecified foot with unspecified severity: Secondary | ICD-10-CM | POA: Diagnosis not present

## 2015-07-11 DIAGNOSIS — L97519 Non-pressure chronic ulcer of other part of right foot with unspecified severity: Secondary | ICD-10-CM

## 2015-07-11 DIAGNOSIS — S92342A Displaced fracture of fourth metatarsal bone, left foot, initial encounter for closed fracture: Secondary | ICD-10-CM | POA: Diagnosis not present

## 2015-07-11 LAB — CBC WITH DIFFERENTIAL/PLATELET
Basophils Absolute: 0 10*3/uL (ref 0.0–0.1)
Basophils Relative: 0 % (ref 0–1)
Eosinophils Absolute: 0.5 10*3/uL (ref 0.0–0.7)
Eosinophils Relative: 8 % — ABNORMAL HIGH (ref 0–5)
HCT: 39.6 % (ref 39.0–52.0)
Hemoglobin: 13.1 g/dL (ref 13.0–17.0)
LYMPHS ABS: 1.9 10*3/uL (ref 0.7–4.0)
LYMPHS PCT: 32 % (ref 12–46)
MCH: 30.9 pg (ref 26.0–34.0)
MCHC: 33.1 g/dL (ref 30.0–36.0)
MCV: 93.4 fL (ref 78.0–100.0)
MONO ABS: 1.1 10*3/uL — AB (ref 0.1–1.0)
Monocytes Relative: 18 % — ABNORMAL HIGH (ref 3–12)
NEUTROS ABS: 2.4 10*3/uL (ref 1.7–7.7)
Neutrophils Relative %: 42 % — ABNORMAL LOW (ref 43–77)
Platelets: 224 10*3/uL (ref 150–400)
RBC: 4.24 MIL/uL (ref 4.22–5.81)
RDW: 15.7 % — ABNORMAL HIGH (ref 11.5–15.5)
WBC: 5.9 10*3/uL (ref 4.0–10.5)

## 2015-07-11 LAB — BASIC METABOLIC PANEL
Anion gap: 11 (ref 5–15)
BUN: 18 mg/dL (ref 6–20)
CALCIUM: 8.7 mg/dL — AB (ref 8.9–10.3)
CHLORIDE: 95 mmol/L — AB (ref 101–111)
CO2: 31 mmol/L (ref 22–32)
Creatinine, Ser: 6.24 mg/dL — ABNORMAL HIGH (ref 0.61–1.24)
GFR calc Af Amer: 10 mL/min — ABNORMAL LOW (ref >60)
GFR calc non Af Amer: 9 mL/min — ABNORMAL LOW (ref >60)
Glucose, Bld: 127 mg/dL — ABNORMAL HIGH (ref 65–99)
POTASSIUM: 3.4 mmol/L — AB (ref 3.5–5.1)
Sodium: 137 mmol/L (ref 135–145)

## 2015-07-11 LAB — CBG MONITORING, ED: Glucose-Capillary: 131 mg/dL — ABNORMAL HIGH (ref 65–99)

## 2015-07-11 MED ORDER — POTASSIUM CHLORIDE CRYS ER 10 MEQ PO TBCR
10.0000 meq | EXTENDED_RELEASE_TABLET | Freq: Once | ORAL | Status: AC
  Administered 2015-07-11: 10 meq via ORAL
  Filled 2015-07-11: qty 1

## 2015-07-11 MED ORDER — CLINDAMYCIN HCL 150 MG PO CAPS: 150.0000 mg | ORAL_CAPSULE | Freq: Four times a day (QID) | ORAL | Status: DC

## 2015-07-11 NOTE — ED Provider Notes (Signed)
CSN: [id number]     Arrival date & time 07/11/15  1711 History   First MD Initiated Contact with Patient 07/11/15 1914     Chief Complaint  Patient presents with  . Wound Check     (Consider location/radiation/quality/duration/timing/severity/associated sxs/prior Treatment) Patient is a 64 y.o. male presenting with wound check. The history is provided by the patient. No language interpreter was used.  Wound Check Pertinent negatives include no arthralgias, chills or fever.   [name] is a 64 year old male with a history of diabetes, hypertension, osteomyelitis, ESRD (last dialyzed today) who presents for bilateral wound check on feet. He states while at dialysis he mentioned that he had not had his feet looked at and they sent him to the ED for evaluation. He states he is ambulatory with no difficulties walking. He has noticed that his feet have been discolored over the past week. He states he has no pain in the feet and cannot feel the distal portion of his feet from the middle of his metatarsal to his toes. He denies any treatment and has not seen his primary care physician in a long time. He denies any fever, chills, chest pain, shortness of breath, cough, abdominal pain, nausea, vomiting, diarrhea, leg swelling, foot pain.  Past Medical History  Diagnosis Date  . Diabetes mellitus   . HTN (hypertension)   . Osteomyelitis   . ESRD (end stage renal disease)   . Cardiomyopathy    Past Surgical History  Procedure Laterality Date  . Left 4th toe amputation    . Cholecystectomy    . Tonsillectomy    . I&d extremity  01/28/2012    Procedure: IRRIGATION AND DEBRIDEMENT EXTREMITY;  Surgeon: Paulene Floor, MD;  Location: [facility];  Service: Orthopedics;  Laterality: Left;  irrigation and drainage left hand  . I&d extremity  01/30/2012    Procedure: IRRIGATION AND DEBRIDEMENT EXTREMITY;  Surgeon: Paulene Floor, MD;  Location: [facility];  Service: Orthopedics;  Laterality: Left;  flexor  teno synovectomy and extensor tenosynovectomy arthrosynovectomy wristjoint  . I&d extremity  02/01/2012    Procedure: IRRIGATION AND DEBRIDEMENT EXTREMITY;  Surgeon: Paulene Floor, MD;  Location: [facility];  Service: Orthopedics;  Laterality: Left;  Left Wrist, Hand and Forearm  . Colonoscopy N/A 11/28/2014    Procedure: COLONOSCOPY;  Surgeon: Ladene Artist, MD;  Location: [facility] ENDOSCOPY;  Service: Endoscopy;  Laterality: N/A;  . Esophagogastroduodenoscopy N/A 11/28/2014    Procedure: ESOPHAGOGASTRODUODENOSCOPY (EGD);  Surgeon: Ladene Artist, MD;  Location: [facility] ENDOSCOPY;  Service: Endoscopy;  Laterality: N/A;  . Av fistula placement Left 11/27/2014    Procedure: ARTERIOVENOUS (AV) FISTULA CREATION;  Surgeon: Conrad Cherokee, MD;  Location: [place];  Service: Vascular;  Laterality: Left;  . Left and right heart catheterization with coronary angiogram N/A 11/26/2014    Procedure: LEFT AND RIGHT HEART CATHETERIZATION WITH CORONARY ANGIOGRAM;  Surgeon: Leonie Man, MD;  Location: [facility] CATH LAB;  Service: Cardiovascular;  Laterality: N/A;   Family History  Problem Relation Age of Onset  . Heart disease Mother   . Diabetes Father   . Diabetes Brother   . Diabetes Sister   . Diabetes Brother   . Diabetes Brother    History  Substance Use Topics  . Smoking status: Former Smoker -- 0.50 packs/day for 4 years    Types: Cigarettes  . Smokeless tobacco: Never Used  . Alcohol Use: No    Review of Systems  Constitutional:  Negative for fever and chills.  Musculoskeletal: Negative for arthralgias and gait problem.  Skin: Positive for wound.  All other systems reviewed and are negative.     Allergies  Penicillins  Home Medications   Prior to Admission medications   Medication Sig Start Date End Date Taking? Authorizing Provider  acetaminophen (TYLENOL) 325 MG tablet Take 650 mg by mouth every 6 (six) hours as needed for moderate pain.   Yes Historical Provider, MD  carvedilol (COREG) 12.5  MG tablet Take 1 tablet (12.5 mg total) by mouth 2 (two) times daily. 05/12/15  Yes Lelon Perla, MD  FOSRENOL 1000 MG chewable tablet CHEW AND SWALLOW 2 TABLETS BY MOUTH 3 TIMES A DAY WITH MEALS 05/21/15  Yes Historical Provider, MD  glucose monitoring kit (FREESTYLE) monitoring kit 1 each by Does not apply route 4 (four) times daily - after meals and at bedtime. 1 month Diabetic Testing Supplies for QAC-QHS accuchecks.Any brand OK 11/29/14  Yes Thurnell Lose, MD  insulin aspart (NOVOLOG) 100 UNIT/ML injection Before each meal 3 times a day, 140-199 - 2 units, 200-250 - 4 units, 251-299 - 6 units,  300-349 - 8 units,  350 or above 10 units. Insulin PEN if approved, provide syringes and needles if needed. 11/29/14  Yes Thurnell Lose, MD  lisinopril (PRINIVIL,ZESTRIL) 5 MG tablet Take 1 tablet (5 mg total) by mouth daily. Patient taking differently: Take 5 mg by mouth every other day.  11/29/14  Yes Thurnell Lose, MD  multivitamin (RENA-VIT) TABS tablet Take 1 tablet by mouth at bedtime. 11/29/14  Yes Thurnell Lose, MD  Vitamin D, Ergocalciferol, (DRISDOL) 50000 UNITS CAPS capsule Take 50,000 Units by mouth every 7 (seven) days. Wednesdays.   Yes Historical Provider, MD  calcium acetate (PHOSLO) 667 MG capsule Take 2 capsules (1,334 mg total) by mouth 3 (three) times daily with meals. Patient not taking: Reported on 07/11/2015 11/29/14   Thurnell Lose, MD  clindamycin (CLEOCIN) 150 MG capsule Take 1 capsule (150 mg total) by mouth every 6 (six) hours. 07/11/15   Colbi Staubs Patel-Mills, PA-C  HYDROcodone-acetaminophen (NORCO/VICODIN) 5-325 MG per tablet Take 1-2 tablets by mouth every 6 (six) hours as needed for moderate pain. Patient not taking: Reported on 07/11/2015 11/29/14   Thurnell Lose, MD  Insulin Syringe-Needle U-100 25G X 1" 1 ML MISC Any brand, for 4 times a day insulin SQ, 1 month supply. 11/29/14   Thurnell Lose, MD   BP 157/74 mmHg  Pulse 73  Temp(Src) 98.5 F (36.9 C) (Oral)   Resp 19  SpO2 98% Physical Exam  Constitutional: He is oriented to person, place, and time. He appears well-developed and well-nourished. No distress.  HENT:  Head: Normocephalic and atraumatic.  Eyes: Conjunctivae are normal.  Neck: Normal range of motion. Neck supple.  Cardiovascular: Normal rate, regular rhythm and normal heart sounds.   Pulmonary/Chest: Effort normal and breath sounds normal. No respiratory distress. He has no wheezes. He has no rales. He exhibits no tenderness.  Abdominal: Soft. There is no tenderness.  Musculoskeletal: Normal range of motion.  Good DP pulses bilaterally. Left fourth toe amputation. Left foot: 3 small ulcerations on the plantar surface of the foot with foul smelling drainage. Right foot: One small ulceration on the plantar surface of the foot without drainage.  No tenderness to palpation. No surrounding erythema or edema.  Neurological: He is alert and oriented to person, place, and time.  Skin: Skin is warm and dry.  He is not diaphoretic.  Psychiatric: He has a normal mood and affect. His behavior is normal.  Nursing note and vitals reviewed.   ED Course  Procedures (including critical care time) Labs Review Labs Reviewed  CBC WITH DIFFERENTIAL/PLATELET - Abnormal; Notable for the following:    RDW 15.7 (*)    Neutrophils Relative % 42 (*)    Monocytes Relative 18 (*)    Eosinophils Relative 8 (*)    Monocytes Absolute 1.1 (*)    All other components within normal limits  BASIC METABOLIC PANEL - Abnormal; Notable for the following:    Potassium 3.4 (*)    Chloride 95 (*)    Glucose, Bld 127 (*)    Creatinine, Ser 6.24 (*)    Calcium 8.7 (*)    GFR calc non Af Amer 9 (*)    GFR calc Af Amer 10 (*)    All other components within normal limits  CBG MONITORING, ED - Abnormal; Notable for the following:    Glucose-Capillary 131 (*)    All other components within normal limits    Imaging Review Dg Foot Complete Left  07/11/2015    CLINICAL DATA:  Wound check, history of diabetes and prior amputation  EXAM: LEFT FOOT - COMPLETE 3+ VIEW  COMPARISON:  08/12/2010  FINDINGS: There are changes consistent with amputation of the fourth digit. Fractures of the second and fourth metatarsal heads are noted of a chronic nature. Chronic changes in the first proximal phalanx are seen as well. No areas of bony erosion to suggest osteomyelitis are noted at this time.  IMPRESSION: Chronic changes without acute abnormality.   Electronically Signed   By: Inez Catalina M.D.   On: 07/11/2015 20:18   Dg Foot Complete Right  07/11/2015   CLINICAL DATA:  Multiple soft tissue wounds, history of diabetes  EXAM: RIGHT FOOT COMPLETE - 3+ VIEW  COMPARISON:  None.  FINDINGS: No acute fracture or dislocation is noted. A soft tissue wound is noted on the inferior aspect of the foot near the heads of the metatarsals. No underlying bony erosion is seen to suggest osteomyelitis. No acute focal abnormality is noted. Diffuse vascular calcifications are seen.  IMPRESSION: Soft tissue wound without bowel bony abnormality.   Electronically Signed   By: Inez Catalina M.D.   On: 07/11/2015 20:19     EKG Interpretation None      MDM   Final diagnoses:  Diabetic ulcer of both feet associated with diabetes mellitus due to underlying condition   Vitals stable. Patient is afebrile. No leukocytosis. X-ray of bilateral feet is negative for osteomyelitis or deep tissue infection. Dressing was applied by nurse. He can change the dressing daily and soak in epsom salt. I gave the patient return precautions such as fever or infection. I discussed that he should keep his wound care appointment on Thursday and he verbally agrees with the plan. I also placed the patient on clindamycin due PCN allergy.   Medications  potassium chloride (K-DUR,KLOR-CON) CR tablet 10 mEq (not administered)      Ottie Glazier, PA-C 07/11/15 2146  Orlie Dakin, MD 07/12/15 1282

## 2015-07-11 NOTE — ED Notes (Signed)
Pt was sent by his MD for wound check. Pt is a diabetic and has wounds on both of his feet that his doctor wanted to have evaluated. Ambulatory to triage room.

## 2015-07-11 NOTE — Discharge Instructions (Signed)
Diabetes and Foot Care Keep your follow-up appointment with wound care on Thursday. Diabetes may cause you to have problems because of poor blood supply (circulation) to your feet and legs. This may cause the skin on your feet to become thinner, break easier, and heal more slowly. Your skin may become dry, and the skin may peel and crack. You may also have nerve damage in your legs and feet causing decreased feeling in them. You may not notice minor injuries to your feet that could lead to infections or more serious problems. Taking care of your feet is one of the most important things you can do for yourself.  HOME CARE INSTRUCTIONS  Wear shoes at all times, even in the house. Do not go barefoot. Bare feet are easily injured.  Check your feet daily for blisters, cuts, and redness. If you cannot see the bottom of your feet, use a mirror or ask someone for help.  Wash your feet with warm water (do not use hot water) and mild soap. Then pat your feet and the areas between your toes until they are completely dry. Do not soak your feet as this can dry your skin.  Apply a moisturizing lotion or petroleum jelly (that does not contain alcohol and is unscented) to the skin on your feet and to dry, brittle toenails. Do not apply lotion between your toes.  Trim your toenails straight across. Do not dig under them or around the cuticle. File the edges of your nails with an emery board or nail file.  Do not cut corns or calluses or try to remove them with medicine.  Wear clean socks or stockings every day. Make sure they are not too tight. Do not wear knee-high stockings since they may decrease blood flow to your legs.  Wear shoes that fit properly and have enough cushioning. To break in new shoes, wear them for just a few hours a day. This prevents you from injuring your feet. Always look in your shoes before you put them on to be sure there are no objects inside.  Do not cross your legs. This may decrease  the blood flow to your feet.  If you find a minor scrape, cut, or break in the skin on your feet, keep it and the skin around it clean and dry. These areas may be cleansed with mild soap and water. Do not cleanse the area with peroxide, alcohol, or iodine.  When you remove an adhesive bandage, be sure not to damage the skin around it.  If you have a wound, look at it several times a day to make sure it is healing.  Do not use heating pads or hot water bottles. They may burn your skin. If you have lost feeling in your feet or legs, you may not know it is happening until it is too late.  Make sure your health care provider performs a complete foot exam at least annually or more often if you have foot problems. Report any cuts, sores, or bruises to your health care provider immediately. SEEK MEDICAL CARE IF:   You have an injury that is not healing.  You have cuts or breaks in the skin.  You have an ingrown nail.  You notice redness on your legs or feet.  You feel burning or tingling in your legs or feet.  You have pain or cramps in your legs and feet.  Your legs or feet are numb.  Your feet always feel cold. SEEK IMMEDIATE  MEDICAL CARE IF:   There is increasing redness, swelling, or pain in or around a wound.  There is a red line that goes up your leg.  Pus is coming from a wound.  You develop a fever or as directed by your health care provider.  You notice a bad smell coming from an ulcer or wound. Document Released: 12/10/2000 Document Revised: 08/15/2013 Document Reviewed: 05/22/2013 [facility] Patient Information 2015 [place], [place]. This information is not intended to replace advice given to you by your health care provider. Make sure you discuss any questions you have with your health care provider.

## 2015-07-11 NOTE — ED Provider Notes (Signed)
Patient with open wounds to both feet for the past several months. He reports minimal drainage from wound on great toe left foot. Denies fever. Denies pain.   Orlie Dakin, MD 07/11/15 2050

## 2015-07-11 NOTE — ED Provider Notes (Deleted)
CSN: [id number]     Arrival date & time 07/11/15  1711 History   First MD Initiated Contact with Patient 07/11/15 1914     Chief Complaint  Patient presents with  . Wound Check     (Consider location/radiation/quality/duration/timing/severity/associated sxs/prior Treatment) HPI  Past Medical History  Diagnosis Date  . Diabetes mellitus   . HTN (hypertension)   . Osteomyelitis   . ESRD (end stage renal disease)   . Cardiomyopathy    Past Surgical History  Procedure Laterality Date  . Left 4th toe amputation    . Cholecystectomy    . Tonsillectomy    . I&d extremity  01/28/2012    Procedure: IRRIGATION AND DEBRIDEMENT EXTREMITY;  Surgeon: Paulene Floor, MD;  Location: [facility];  Service: Orthopedics;  Laterality: Left;  irrigation and drainage left hand  . I&d extremity  01/30/2012    Procedure: IRRIGATION AND DEBRIDEMENT EXTREMITY;  Surgeon: Paulene Floor, MD;  Location: [facility];  Service: Orthopedics;  Laterality: Left;  flexor teno synovectomy and extensor tenosynovectomy arthrosynovectomy wristjoint  . I&d extremity  02/01/2012    Procedure: IRRIGATION AND DEBRIDEMENT EXTREMITY;  Surgeon: Paulene Floor, MD;  Location: [facility];  Service: Orthopedics;  Laterality: Left;  Left Wrist, Hand and Forearm  . Colonoscopy N/A 11/28/2014    Procedure: COLONOSCOPY;  Surgeon: Ladene Artist, MD;  Location: [facility] ENDOSCOPY;  Service: Endoscopy;  Laterality: N/A;  . Esophagogastroduodenoscopy N/A 11/28/2014    Procedure: ESOPHAGOGASTRODUODENOSCOPY (EGD);  Surgeon: Ladene Artist, MD;  Location: [facility] ENDOSCOPY;  Service: Endoscopy;  Laterality: N/A;  . Av fistula placement Left 11/27/2014    Procedure: ARTERIOVENOUS (AV) FISTULA CREATION;  Surgeon: Conrad Montegut, MD;  Location: [facility];  Service: Vascular;  Laterality: Left;  . Left and right heart catheterization with coronary angiogram N/A 11/26/2014    Procedure: LEFT AND RIGHT HEART CATHETERIZATION WITH CORONARY ANGIOGRAM;  Surgeon:  Leonie Man, MD;  Location: [facility] CATH LAB;  Service: Cardiovascular;  Laterality: N/A;   Family History  Problem Relation Age of Onset  . Heart disease Mother   . Diabetes Father   . Diabetes Brother   . Diabetes Sister   . Diabetes Brother   . Diabetes Brother    History  Substance Use Topics  . Smoking status: Former Smoker -- 0.50 packs/day for 4 years    Types: Cigarettes  . Smokeless tobacco: Never Used  . Alcohol Use: No    Review of Systems    Allergies  Penicillins  Home Medications   Prior to Admission medications   Medication Sig Start Date End Date Taking? Authorizing Provider  acetaminophen (TYLENOL) 325 MG tablet Take 650 mg by mouth every 6 (six) hours as needed for moderate pain.   Yes Historical Provider, MD  carvedilol (COREG) 12.5 MG tablet Take 1 tablet (12.5 mg total) by mouth 2 (two) times daily. 05/12/15  Yes Lelon Perla, MD  FOSRENOL 1000 MG chewable tablet CHEW AND SWALLOW 2 TABLETS BY MOUTH 3 TIMES A DAY WITH MEALS 05/21/15  Yes Historical Provider, MD  glucose monitoring kit (FREESTYLE) monitoring kit 1 each by Does not apply route 4 (four) times daily - after meals and at bedtime. 1 month Diabetic Testing Supplies for QAC-QHS accuchecks.Any brand OK 11/29/14  Yes Thurnell Lose, MD  insulin aspart (NOVOLOG) 100 UNIT/ML injection Before each meal 3 times a day, 140-199 - 2 units, 200-250 - 4 units, 251-299 - 6 units,  300-349 -  8 units,  350 or above 10 units. Insulin PEN if approved, provide syringes and needles if needed. 11/29/14  Yes Thurnell Lose, MD  lisinopril (PRINIVIL,ZESTRIL) 5 MG tablet Take 1 tablet (5 mg total) by mouth daily. Patient taking differently: Take 5 mg by mouth every other day.  11/29/14  Yes Thurnell Lose, MD  multivitamin (RENA-VIT) TABS tablet Take 1 tablet by mouth at bedtime. 11/29/14  Yes Thurnell Lose, MD  Vitamin D, Ergocalciferol, (DRISDOL) 50000 UNITS CAPS capsule Take 50,000 Units by mouth every 7 (seven)  days. Wednesdays.   Yes Historical Provider, MD  calcium acetate (PHOSLO) 667 MG capsule Take 2 capsules (1,334 mg total) by mouth 3 (three) times daily with meals. Patient not taking: Reported on 07/11/2015 11/29/14   Thurnell Lose, MD  HYDROcodone-acetaminophen (NORCO/VICODIN) 5-325 MG per tablet Take 1-2 tablets by mouth every 6 (six) hours as needed for moderate pain. Patient not taking: Reported on 07/11/2015 11/29/14   Thurnell Lose, MD  Insulin Syringe-Needle U-100 25G X 1" 1 ML MISC Any brand, for 4 times a day insulin SQ, 1 month supply. 11/29/14   Thurnell Lose, MD   BP 157/74 mmHg  Pulse 73  Temp(Src) 98.5 F (36.9 C) (Oral)  Resp 19  SpO2 98% Physical Exam  ED Course  Procedures (including critical care time) Labs Review Labs Reviewed  CBC WITH DIFFERENTIAL/PLATELET - Abnormal; Notable for the following:    RDW 15.7 (*)    Neutrophils Relative % 42 (*)    Monocytes Relative 18 (*)    Eosinophils Relative 8 (*)    Monocytes Absolute 1.1 (*)    All other components within normal limits  BASIC METABOLIC PANEL - Abnormal; Notable for the following:    Potassium 3.4 (*)    Chloride 95 (*)    Glucose, Bld 127 (*)    Creatinine, Ser 6.24 (*)    Calcium 8.7 (*)    GFR calc non Af Amer 9 (*)    GFR calc Af Amer 10 (*)    All other components within normal limits  CBG MONITORING, ED - Abnormal; Notable for the following:    Glucose-Capillary 131 (*)    All other components within normal limits    Imaging Review Dg Foot Complete Left  07/11/2015   CLINICAL DATA:  Wound check, history of diabetes and prior amputation  EXAM: LEFT FOOT - COMPLETE 3+ VIEW  COMPARISON:  08/12/2010  FINDINGS: There are changes consistent with amputation of the fourth digit. Fractures of the second and fourth metatarsal heads are noted of a chronic nature. Chronic changes in the first proximal phalanx are seen as well. No areas of bony erosion to suggest osteomyelitis are noted at this time.   IMPRESSION: Chronic changes without acute abnormality.   Electronically Signed   By: Inez Catalina M.D.   On: 07/11/2015 20:18   Dg Foot Complete Right  07/11/2015   CLINICAL DATA:  Multiple soft tissue wounds, history of diabetes  EXAM: RIGHT FOOT COMPLETE - 3+ VIEW  COMPARISON:  None.  FINDINGS: No acute fracture or dislocation is noted. A soft tissue wound is noted on the inferior aspect of the foot near the heads of the metatarsals. No underlying bony erosion is seen to suggest osteomyelitis. No acute focal abnormality is noted. Diffuse vascular calcifications are seen.  IMPRESSION: Soft tissue wound without bowel bony abnormality.   Electronically Signed   By: Inez Catalina M.D.   On: 07/11/2015  20:19     EKG Interpretation None      MDM   Final diagnoses:  None    Please delete duplicate note    Orlie Dakin, MD 07/12/15 2091

## 2015-07-11 NOTE — ED Notes (Signed)
Pt in radiology at this time. 

## 2015-07-14 DIAGNOSIS — D631 Anemia in chronic kidney disease: Secondary | ICD-10-CM | POA: Diagnosis not present

## 2015-07-14 DIAGNOSIS — N186 End stage renal disease: Secondary | ICD-10-CM | POA: Diagnosis not present

## 2015-07-14 DIAGNOSIS — E1121 Type 2 diabetes mellitus with diabetic nephropathy: Secondary | ICD-10-CM | POA: Diagnosis not present

## 2015-07-14 DIAGNOSIS — D509 Iron deficiency anemia, unspecified: Secondary | ICD-10-CM | POA: Diagnosis not present

## 2015-07-14 DIAGNOSIS — N2581 Secondary hyperparathyroidism of renal origin: Secondary | ICD-10-CM | POA: Diagnosis not present

## 2015-07-16 DIAGNOSIS — N2581 Secondary hyperparathyroidism of renal origin: Secondary | ICD-10-CM | POA: Diagnosis not present

## 2015-07-16 DIAGNOSIS — D509 Iron deficiency anemia, unspecified: Secondary | ICD-10-CM | POA: Diagnosis not present

## 2015-07-16 DIAGNOSIS — N186 End stage renal disease: Secondary | ICD-10-CM | POA: Diagnosis not present

## 2015-07-16 DIAGNOSIS — D631 Anemia in chronic kidney disease: Secondary | ICD-10-CM | POA: Diagnosis not present

## 2015-07-16 DIAGNOSIS — E1121 Type 2 diabetes mellitus with diabetic nephropathy: Secondary | ICD-10-CM | POA: Diagnosis not present

## 2015-07-17 DIAGNOSIS — L97519 Non-pressure chronic ulcer of other part of right foot with unspecified severity: Secondary | ICD-10-CM | POA: Diagnosis not present

## 2015-07-17 DIAGNOSIS — L97529 Non-pressure chronic ulcer of other part of left foot with unspecified severity: Secondary | ICD-10-CM | POA: Diagnosis not present

## 2015-07-18 DIAGNOSIS — D631 Anemia in chronic kidney disease: Secondary | ICD-10-CM | POA: Diagnosis not present

## 2015-07-18 DIAGNOSIS — E1121 Type 2 diabetes mellitus with diabetic nephropathy: Secondary | ICD-10-CM | POA: Diagnosis not present

## 2015-07-18 DIAGNOSIS — D509 Iron deficiency anemia, unspecified: Secondary | ICD-10-CM | POA: Diagnosis not present

## 2015-07-18 DIAGNOSIS — N186 End stage renal disease: Secondary | ICD-10-CM | POA: Diagnosis not present

## 2015-07-18 DIAGNOSIS — N2581 Secondary hyperparathyroidism of renal origin: Secondary | ICD-10-CM | POA: Diagnosis not present

## 2015-07-21 DIAGNOSIS — D509 Iron deficiency anemia, unspecified: Secondary | ICD-10-CM | POA: Diagnosis not present

## 2015-07-21 DIAGNOSIS — E1121 Type 2 diabetes mellitus with diabetic nephropathy: Secondary | ICD-10-CM | POA: Diagnosis not present

## 2015-07-21 DIAGNOSIS — N2581 Secondary hyperparathyroidism of renal origin: Secondary | ICD-10-CM | POA: Diagnosis not present

## 2015-07-21 DIAGNOSIS — D631 Anemia in chronic kidney disease: Secondary | ICD-10-CM | POA: Diagnosis not present

## 2015-07-21 DIAGNOSIS — N186 End stage renal disease: Secondary | ICD-10-CM | POA: Diagnosis not present

## 2015-07-23 DIAGNOSIS — N186 End stage renal disease: Secondary | ICD-10-CM | POA: Diagnosis not present

## 2015-07-23 DIAGNOSIS — D631 Anemia in chronic kidney disease: Secondary | ICD-10-CM | POA: Diagnosis not present

## 2015-07-23 DIAGNOSIS — E1121 Type 2 diabetes mellitus with diabetic nephropathy: Secondary | ICD-10-CM | POA: Diagnosis not present

## 2015-07-23 DIAGNOSIS — N2581 Secondary hyperparathyroidism of renal origin: Secondary | ICD-10-CM | POA: Diagnosis not present

## 2015-07-23 DIAGNOSIS — D509 Iron deficiency anemia, unspecified: Secondary | ICD-10-CM | POA: Diagnosis not present

## 2015-07-24 DIAGNOSIS — L97529 Non-pressure chronic ulcer of other part of left foot with unspecified severity: Secondary | ICD-10-CM | POA: Diagnosis not present

## 2015-07-24 DIAGNOSIS — L97519 Non-pressure chronic ulcer of other part of right foot with unspecified severity: Secondary | ICD-10-CM | POA: Diagnosis not present

## 2015-07-25 DIAGNOSIS — N2581 Secondary hyperparathyroidism of renal origin: Secondary | ICD-10-CM | POA: Diagnosis not present

## 2015-07-25 DIAGNOSIS — D631 Anemia in chronic kidney disease: Secondary | ICD-10-CM | POA: Diagnosis not present

## 2015-07-25 DIAGNOSIS — N186 End stage renal disease: Secondary | ICD-10-CM | POA: Diagnosis not present

## 2015-07-25 DIAGNOSIS — D509 Iron deficiency anemia, unspecified: Secondary | ICD-10-CM | POA: Diagnosis not present

## 2015-07-25 DIAGNOSIS — E1121 Type 2 diabetes mellitus with diabetic nephropathy: Secondary | ICD-10-CM | POA: Diagnosis not present

## 2015-07-27 DIAGNOSIS — E1122 Type 2 diabetes mellitus with diabetic chronic kidney disease: Secondary | ICD-10-CM | POA: Diagnosis not present

## 2015-07-27 DIAGNOSIS — Z992 Dependence on renal dialysis: Secondary | ICD-10-CM | POA: Diagnosis not present

## 2015-07-27 DIAGNOSIS — N186 End stage renal disease: Secondary | ICD-10-CM | POA: Diagnosis not present

## 2015-07-28 DIAGNOSIS — D509 Iron deficiency anemia, unspecified: Secondary | ICD-10-CM | POA: Diagnosis not present

## 2015-07-28 DIAGNOSIS — N2581 Secondary hyperparathyroidism of renal origin: Secondary | ICD-10-CM | POA: Diagnosis not present

## 2015-07-28 DIAGNOSIS — N186 End stage renal disease: Secondary | ICD-10-CM | POA: Diagnosis not present

## 2015-07-28 DIAGNOSIS — D631 Anemia in chronic kidney disease: Secondary | ICD-10-CM | POA: Diagnosis not present

## 2015-07-30 DIAGNOSIS — N186 End stage renal disease: Secondary | ICD-10-CM | POA: Diagnosis not present

## 2015-07-30 DIAGNOSIS — N2581 Secondary hyperparathyroidism of renal origin: Secondary | ICD-10-CM | POA: Diagnosis not present

## 2015-07-30 DIAGNOSIS — D509 Iron deficiency anemia, unspecified: Secondary | ICD-10-CM | POA: Diagnosis not present

## 2015-07-30 DIAGNOSIS — D631 Anemia in chronic kidney disease: Secondary | ICD-10-CM | POA: Diagnosis not present

## 2015-07-31 ENCOUNTER — Telehealth: Payer: Self-pay | Admitting: Cardiovascular Disease

## 2015-07-31 DIAGNOSIS — L97519 Non-pressure chronic ulcer of other part of right foot with unspecified severity: Secondary | ICD-10-CM | POA: Diagnosis not present

## 2015-07-31 DIAGNOSIS — L97529 Non-pressure chronic ulcer of other part of left foot with unspecified severity: Secondary | ICD-10-CM | POA: Diagnosis not present

## 2015-07-31 NOTE — Telephone Encounter (Signed)
07/30/2015 Received medical records from [facility] for upcoming appointment with Dr. Gwenlyn Found on 08/12/2015.  Records given to [facility].

## 2015-08-01 DIAGNOSIS — D631 Anemia in chronic kidney disease: Secondary | ICD-10-CM | POA: Diagnosis not present

## 2015-08-01 DIAGNOSIS — N2581 Secondary hyperparathyroidism of renal origin: Secondary | ICD-10-CM | POA: Diagnosis not present

## 2015-08-01 DIAGNOSIS — D509 Iron deficiency anemia, unspecified: Secondary | ICD-10-CM | POA: Diagnosis not present

## 2015-08-01 DIAGNOSIS — N186 End stage renal disease: Secondary | ICD-10-CM | POA: Diagnosis not present

## 2015-08-04 DIAGNOSIS — D631 Anemia in chronic kidney disease: Secondary | ICD-10-CM | POA: Diagnosis not present

## 2015-08-04 DIAGNOSIS — N186 End stage renal disease: Secondary | ICD-10-CM | POA: Diagnosis not present

## 2015-08-04 DIAGNOSIS — D509 Iron deficiency anemia, unspecified: Secondary | ICD-10-CM | POA: Diagnosis not present

## 2015-08-04 DIAGNOSIS — N2581 Secondary hyperparathyroidism of renal origin: Secondary | ICD-10-CM | POA: Diagnosis not present

## 2015-08-06 DIAGNOSIS — D509 Iron deficiency anemia, unspecified: Secondary | ICD-10-CM | POA: Diagnosis not present

## 2015-08-06 DIAGNOSIS — N186 End stage renal disease: Secondary | ICD-10-CM | POA: Diagnosis not present

## 2015-08-06 DIAGNOSIS — N2581 Secondary hyperparathyroidism of renal origin: Secondary | ICD-10-CM | POA: Diagnosis not present

## 2015-08-06 DIAGNOSIS — D631 Anemia in chronic kidney disease: Secondary | ICD-10-CM | POA: Diagnosis not present

## 2015-08-08 DIAGNOSIS — D631 Anemia in chronic kidney disease: Secondary | ICD-10-CM | POA: Diagnosis not present

## 2015-08-08 DIAGNOSIS — N186 End stage renal disease: Secondary | ICD-10-CM | POA: Diagnosis not present

## 2015-08-08 DIAGNOSIS — N2581 Secondary hyperparathyroidism of renal origin: Secondary | ICD-10-CM | POA: Diagnosis not present

## 2015-08-08 DIAGNOSIS — D509 Iron deficiency anemia, unspecified: Secondary | ICD-10-CM | POA: Diagnosis not present

## 2015-08-11 DIAGNOSIS — N186 End stage renal disease: Secondary | ICD-10-CM | POA: Diagnosis not present

## 2015-08-11 DIAGNOSIS — D509 Iron deficiency anemia, unspecified: Secondary | ICD-10-CM | POA: Diagnosis not present

## 2015-08-11 DIAGNOSIS — N2581 Secondary hyperparathyroidism of renal origin: Secondary | ICD-10-CM | POA: Diagnosis not present

## 2015-08-11 DIAGNOSIS — D631 Anemia in chronic kidney disease: Secondary | ICD-10-CM | POA: Diagnosis not present

## 2015-08-12 ENCOUNTER — Encounter: Payer: Self-pay | Admitting: Cardiovascular Disease

## 2015-08-12 ENCOUNTER — Ambulatory Visit (INDEPENDENT_AMBULATORY_CARE_PROVIDER_SITE_OTHER): Payer: Medicare Other | Admitting: Cardiovascular Disease

## 2015-08-12 VITALS — BP 94/64 | HR 80 | Ht 73.0 in | Wt 195.3 lb

## 2015-08-12 DIAGNOSIS — I70229 Atherosclerosis of native arteries of extremities with rest pain, unspecified extremity: Secondary | ICD-10-CM

## 2015-08-12 DIAGNOSIS — I998 Other disorder of circulatory system: Secondary | ICD-10-CM

## 2015-08-12 DIAGNOSIS — I739 Peripheral vascular disease, unspecified: Secondary | ICD-10-CM | POA: Diagnosis not present

## 2015-08-12 NOTE — Assessment & Plan Note (Signed)
Mr. Amstutz was referred to me by Dr. Barkley Bruns, his podiatrist, for peripheral vascular evaluation. He is a cardiology patient of Dr. Jacalyn Lefevre. He has risk factors including hypertension and diabetes and family history. He does have chronic renal insufficiency on hemodialysis for the last 8 months. Dr. Bridgett Larsson has placed multiple AV fistulas. He said nonhealing ulcers on the plantar surface of both feet at the metatarsal heads that were probably pressure related with healed over the past with offloading. He does have palpable pedal pulses. I'm going to get lower extremity Doppler studies on him to further evaluate the extent of his PAD. At this point, given his palpable pulses I doubt that his pressure/ischemic ulcers are related to large vessel disease. I'll see him back in 4-6 weeks for follow-up.

## 2015-08-12 NOTE — Patient Instructions (Addendum)
Dr Gwenlyn Found has requested that you have a lower extremity arterial duplex. This test is an ultrasound of the arteries in the legs. It looks at arterial blood flow in the legs. Allow one hour for Lower Arterial scans. There are no restrictions or special instructions.  Your physician recommends that you schedule a follow-up appointment in 4-6 weeks.

## 2015-08-12 NOTE — Progress Notes (Signed)
08/12/2015 [name] Sages Sr.   26-Feb-1951  [id number]  Primary Physician [name], MD Primary Cardiologist: Lorretta Harp MD Renae Gloss   HPI:  Mr. Horsch is a very pleasant 64 year old married African-American male father of 3 children, grandfather of a grandchild referred by Dr. Barkley Bruns, his podiatrist, for peripheral vascular evaluation. His cardiologist is Dr. Stanford Breed. He has a history of hypertension and diabetes as well as family history of heart disease although he has never had a heart attack or stroke. He denies chest pain or shortness of breath. 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 He has had non-healing wounds in both feet off and on for the last 5 or 6 years. Does have diabetic peripheral neuropathy. He is wearing surgical shoes for offloading and apparently sent some of these wounds which are currently plantar surface of his foot at pressure points/metatarsal heads, are slowly healing. He does have palpable pedal pulses.   Current Outpatient Prescriptions  Medication Sig Dispense Refill  . acetaminophen (TYLENOL) 325 MG tablet Take 650 mg by mouth every 6 (six) hours as needed for moderate pain.    . carvedilol (COREG) 12.5 MG tablet Take 1 tablet (12.5 mg total) by mouth 2 (two) times daily. 180 tablet 3  . clindamycin (CLEOCIN) 150 MG capsule Take 1 capsule (150 mg total) by mouth every 6 (six) hours. 28 capsule 0  . FOSRENOL 1000 MG chewable tablet CHEW AND SWALLOW 2 TABLETS BY MOUTH 3 TIMES A DAY WITH MEALS  11  . glucose monitoring kit (FREESTYLE) monitoring kit 1 each by Does not apply route 4 (four) times daily - after meals and at bedtime. 1 month Diabetic Testing Supplies for QAC-QHS accuchecks.Any brand OK 1 each 1  . HYDROcodone-acetaminophen (NORCO/VICODIN) 5-325 MG per tablet Take 1-2 tablets by mouth every 6 (six) hours as needed for moderate pain. 15 tablet 0  . insulin aspart (NOVOLOG)  100 UNIT/ML injection Before each meal 3 times a day, 140-199 - 2 units, 200-250 - 4 units, 251-299 - 6 units,  300-349 - 8 units,  350 or above 10 units. Insulin PEN if approved, provide syringes and needles if needed. 10 mL 11  . Insulin Syringe-Needle U-100 25G X 1" 1 ML MISC Any brand, for 4 times a day insulin SQ, 1 month supply. 30 each 0  . multivitamin (RENA-VIT) TABS tablet Take 1 tablet by mouth at bedtime. 30 each 0  . SANTYL ointment Use daily as prescribed    . SENSIPAR 30 MG tablet Take 30 mg by mouth daily. Take 1 tab daily  3  . Vitamin D, Ergocalciferol, (DRISDOL) 50000 UNITS CAPS capsule Take 50,000 Units by mouth every 7 (seven) days. Wednesdays.     No current facility-administered medications for this visit.    Allergies  Allergen Reactions  . Penicillins Hives and Swelling    Social History   Social History  . Marital Status: Married    Spouse Name: [name]  . Number of Children: 3  . Years of Education: 16   Occupational History  . Maintenance Technician    Social History Main Topics  . Smoking status: Former Smoker -- 0.50 packs/day for 4 years    Types: Cigarettes  . Smokeless tobacco: Never Used  . Alcohol Use: No  . Drug Use: No  . Sexual Activity: Not on file   Other Topics Concern  . Not on file  Social History Narrative     Review of Systems: General: negative for chills, fever, night sweats or weight changes.  Cardiovascular: negative for chest pain, dyspnea on exertion, edema, orthopnea, palpitations, paroxysmal nocturnal dyspnea or shortness of breath Dermatological: negative for rash Respiratory: negative for cough or wheezing Urologic: negative for hematuria Abdominal: negative for nausea, vomiting, diarrhea, bright red blood per rectum, melena, or hematemesis Neurologic: negative for visual changes, syncope, or dizziness All other systems reviewed and are otherwise negative except as noted above.    Blood pressure 94/64, pulse  80, height 6' 1" (1.854 m), weight 195 lb 5 oz (88.593 kg).  General appearance: alert and no distress Neck: no adenopathy, no carotid bruit, no JVD, supple, symmetrical, trachea midline and thyroid not enlarged, symmetric, no tenderness/mass/nodules Lungs: clear to auscultation bilaterally Heart: regular rate and rhythm, S1, S2 normal, no murmur, click, rub or gallop Extremities: 1+ pedal pulses bilaterally  EKG not performed today  ASSESSMENT AND PLAN:   Peripheral arterial disease Mr. Althaus was referred to me by Dr. Barkley Bruns, his podiatrist, for peripheral vascular evaluation. He is a cardiology patient of Dr. Jacalyn Lefevre. He has risk factors including hypertension and diabetes and family history. He does have chronic renal insufficiency on hemodialysis for the last 8 months. Dr. Bridgett Larsson has placed multiple AV fistulas. He said nonhealing ulcers on the plantar surface of both feet at the metatarsal heads that were probably pressure related with healed over the past with offloading. He does have palpable pedal pulses. I'm going to get lower extremity Doppler studies on him to further evaluate the extent of his PAD. At this point, given his palpable pulses I doubt that his pressure/ischemic ulcers are related to large vessel disease. I'll see him back in 4-6 weeks for follow-up.      Lorretta Harp MD FACP,FACC,FAHA, [facility] 08/12/2015 9:07 AM

## 2015-08-13 DIAGNOSIS — D631 Anemia in chronic kidney disease: Secondary | ICD-10-CM | POA: Diagnosis not present

## 2015-08-13 DIAGNOSIS — N186 End stage renal disease: Secondary | ICD-10-CM | POA: Diagnosis not present

## 2015-08-13 DIAGNOSIS — N2581 Secondary hyperparathyroidism of renal origin: Secondary | ICD-10-CM | POA: Diagnosis not present

## 2015-08-13 DIAGNOSIS — D509 Iron deficiency anemia, unspecified: Secondary | ICD-10-CM | POA: Diagnosis not present

## 2015-08-15 DIAGNOSIS — N186 End stage renal disease: Secondary | ICD-10-CM | POA: Diagnosis not present

## 2015-08-15 DIAGNOSIS — N2581 Secondary hyperparathyroidism of renal origin: Secondary | ICD-10-CM | POA: Diagnosis not present

## 2015-08-15 DIAGNOSIS — D631 Anemia in chronic kidney disease: Secondary | ICD-10-CM | POA: Diagnosis not present

## 2015-08-15 DIAGNOSIS — D509 Iron deficiency anemia, unspecified: Secondary | ICD-10-CM | POA: Diagnosis not present

## 2015-08-18 DIAGNOSIS — N2581 Secondary hyperparathyroidism of renal origin: Secondary | ICD-10-CM | POA: Diagnosis not present

## 2015-08-18 DIAGNOSIS — D509 Iron deficiency anemia, unspecified: Secondary | ICD-10-CM | POA: Diagnosis not present

## 2015-08-18 DIAGNOSIS — N186 End stage renal disease: Secondary | ICD-10-CM | POA: Diagnosis not present

## 2015-08-18 DIAGNOSIS — D631 Anemia in chronic kidney disease: Secondary | ICD-10-CM | POA: Diagnosis not present

## 2015-08-20 DIAGNOSIS — D631 Anemia in chronic kidney disease: Secondary | ICD-10-CM | POA: Diagnosis not present

## 2015-08-20 DIAGNOSIS — N186 End stage renal disease: Secondary | ICD-10-CM | POA: Diagnosis not present

## 2015-08-20 DIAGNOSIS — D509 Iron deficiency anemia, unspecified: Secondary | ICD-10-CM | POA: Diagnosis not present

## 2015-08-20 DIAGNOSIS — N2581 Secondary hyperparathyroidism of renal origin: Secondary | ICD-10-CM | POA: Diagnosis not present

## 2015-08-21 ENCOUNTER — Ambulatory Visit (HOSPITAL_COMMUNITY)
Admission: RE | Admit: 2015-08-21 | Discharge: 2015-08-21 | Disposition: A | Discharge status: Home / Self Care | Payer: Medicare Other | Source: Ambulatory Visit | Attending: Cardiology | Admitting: Cardiology

## 2015-08-21 DIAGNOSIS — I739 Peripheral vascular disease, unspecified: Secondary | ICD-10-CM | POA: Insufficient documentation

## 2015-08-21 DIAGNOSIS — I998 Other disorder of circulatory system: Secondary | ICD-10-CM | POA: Diagnosis not present

## 2015-08-21 DIAGNOSIS — I70229 Atherosclerosis of native arteries of extremities with rest pain, unspecified extremity: Secondary | ICD-10-CM

## 2015-08-21 DIAGNOSIS — L97519 Non-pressure chronic ulcer of other part of right foot with unspecified severity: Secondary | ICD-10-CM | POA: Diagnosis not present

## 2015-08-21 DIAGNOSIS — E1122 Type 2 diabetes mellitus with diabetic chronic kidney disease: Secondary | ICD-10-CM | POA: Insufficient documentation

## 2015-08-21 DIAGNOSIS — L97529 Non-pressure chronic ulcer of other part of left foot with unspecified severity: Secondary | ICD-10-CM | POA: Diagnosis not present

## 2015-08-21 DIAGNOSIS — I120 Hypertensive chronic kidney disease with stage 5 chronic kidney disease or end stage renal disease: Secondary | ICD-10-CM | POA: Diagnosis not present

## 2015-08-21 DIAGNOSIS — N186 End stage renal disease: Secondary | ICD-10-CM | POA: Diagnosis not present

## 2015-08-22 DIAGNOSIS — D631 Anemia in chronic kidney disease: Secondary | ICD-10-CM | POA: Diagnosis not present

## 2015-08-22 DIAGNOSIS — D509 Iron deficiency anemia, unspecified: Secondary | ICD-10-CM | POA: Diagnosis not present

## 2015-08-22 DIAGNOSIS — N2581 Secondary hyperparathyroidism of renal origin: Secondary | ICD-10-CM | POA: Diagnosis not present

## 2015-08-22 DIAGNOSIS — N186 End stage renal disease: Secondary | ICD-10-CM | POA: Diagnosis not present

## 2015-08-25 DIAGNOSIS — D509 Iron deficiency anemia, unspecified: Secondary | ICD-10-CM | POA: Diagnosis not present

## 2015-08-25 DIAGNOSIS — N186 End stage renal disease: Secondary | ICD-10-CM | POA: Diagnosis not present

## 2015-08-25 DIAGNOSIS — N2581 Secondary hyperparathyroidism of renal origin: Secondary | ICD-10-CM | POA: Diagnosis not present

## 2015-08-25 DIAGNOSIS — D631 Anemia in chronic kidney disease: Secondary | ICD-10-CM | POA: Diagnosis not present

## 2015-08-27 DIAGNOSIS — N2581 Secondary hyperparathyroidism of renal origin: Secondary | ICD-10-CM | POA: Diagnosis not present

## 2015-08-27 DIAGNOSIS — N186 End stage renal disease: Secondary | ICD-10-CM | POA: Diagnosis not present

## 2015-08-27 DIAGNOSIS — E1122 Type 2 diabetes mellitus with diabetic chronic kidney disease: Secondary | ICD-10-CM | POA: Diagnosis not present

## 2015-08-27 DIAGNOSIS — Z992 Dependence on renal dialysis: Secondary | ICD-10-CM | POA: Diagnosis not present

## 2015-08-27 DIAGNOSIS — D631 Anemia in chronic kidney disease: Secondary | ICD-10-CM | POA: Diagnosis not present

## 2015-08-27 DIAGNOSIS — D509 Iron deficiency anemia, unspecified: Secondary | ICD-10-CM | POA: Diagnosis not present

## 2015-08-29 DIAGNOSIS — Z23 Encounter for immunization: Secondary | ICD-10-CM | POA: Diagnosis not present

## 2015-08-29 DIAGNOSIS — E1121 Type 2 diabetes mellitus with diabetic nephropathy: Secondary | ICD-10-CM | POA: Diagnosis not present

## 2015-08-29 DIAGNOSIS — N2581 Secondary hyperparathyroidism of renal origin: Secondary | ICD-10-CM | POA: Diagnosis not present

## 2015-08-29 DIAGNOSIS — N186 End stage renal disease: Secondary | ICD-10-CM | POA: Diagnosis not present

## 2015-09-01 DIAGNOSIS — Z23 Encounter for immunization: Secondary | ICD-10-CM | POA: Diagnosis not present

## 2015-09-01 DIAGNOSIS — E1121 Type 2 diabetes mellitus with diabetic nephropathy: Secondary | ICD-10-CM | POA: Diagnosis not present

## 2015-09-01 DIAGNOSIS — N2581 Secondary hyperparathyroidism of renal origin: Secondary | ICD-10-CM | POA: Diagnosis not present

## 2015-09-01 DIAGNOSIS — N186 End stage renal disease: Secondary | ICD-10-CM | POA: Diagnosis not present

## 2015-09-03 DIAGNOSIS — Z23 Encounter for immunization: Secondary | ICD-10-CM | POA: Diagnosis not present

## 2015-09-03 DIAGNOSIS — E1121 Type 2 diabetes mellitus with diabetic nephropathy: Secondary | ICD-10-CM | POA: Diagnosis not present

## 2015-09-03 DIAGNOSIS — N2581 Secondary hyperparathyroidism of renal origin: Secondary | ICD-10-CM | POA: Diagnosis not present

## 2015-09-03 DIAGNOSIS — N186 End stage renal disease: Secondary | ICD-10-CM | POA: Diagnosis not present

## 2015-09-04 ENCOUNTER — Encounter: Payer: Self-pay | Admitting: *Deleted

## 2015-09-04 DIAGNOSIS — L97529 Non-pressure chronic ulcer of other part of left foot with unspecified severity: Secondary | ICD-10-CM | POA: Diagnosis not present

## 2015-09-04 DIAGNOSIS — T8189XA Other complications of procedures, not elsewhere classified, initial encounter: Secondary | ICD-10-CM | POA: Diagnosis not present

## 2015-09-05 DIAGNOSIS — N186 End stage renal disease: Secondary | ICD-10-CM | POA: Diagnosis not present

## 2015-09-05 DIAGNOSIS — Z23 Encounter for immunization: Secondary | ICD-10-CM | POA: Diagnosis not present

## 2015-09-05 DIAGNOSIS — E1121 Type 2 diabetes mellitus with diabetic nephropathy: Secondary | ICD-10-CM | POA: Diagnosis not present

## 2015-09-05 DIAGNOSIS — N2581 Secondary hyperparathyroidism of renal origin: Secondary | ICD-10-CM | POA: Diagnosis not present

## 2015-09-08 DIAGNOSIS — Z23 Encounter for immunization: Secondary | ICD-10-CM | POA: Diagnosis not present

## 2015-09-08 DIAGNOSIS — E1121 Type 2 diabetes mellitus with diabetic nephropathy: Secondary | ICD-10-CM | POA: Diagnosis not present

## 2015-09-08 DIAGNOSIS — N186 End stage renal disease: Secondary | ICD-10-CM | POA: Diagnosis not present

## 2015-09-08 DIAGNOSIS — N2581 Secondary hyperparathyroidism of renal origin: Secondary | ICD-10-CM | POA: Diagnosis not present

## 2015-09-10 ENCOUNTER — Other Ambulatory Visit: Payer: Self-pay | Admitting: Cardiovascular Disease

## 2015-09-10 DIAGNOSIS — E1121 Type 2 diabetes mellitus with diabetic nephropathy: Secondary | ICD-10-CM | POA: Diagnosis not present

## 2015-09-10 DIAGNOSIS — Z23 Encounter for immunization: Secondary | ICD-10-CM | POA: Diagnosis not present

## 2015-09-10 DIAGNOSIS — N186 End stage renal disease: Secondary | ICD-10-CM | POA: Diagnosis not present

## 2015-09-10 DIAGNOSIS — N2581 Secondary hyperparathyroidism of renal origin: Secondary | ICD-10-CM | POA: Diagnosis not present

## 2015-09-10 DIAGNOSIS — I739 Peripheral vascular disease, unspecified: Secondary | ICD-10-CM

## 2015-09-11 ENCOUNTER — Ambulatory Visit (HOSPITAL_COMMUNITY)
Admission: RE | Admit: 2015-09-11 | Discharge: 2015-09-11 | Disposition: A | Discharge status: Home / Self Care | Payer: Medicare Other | Source: Ambulatory Visit | Attending: Cardiology | Admitting: Cardiology

## 2015-09-11 DIAGNOSIS — I739 Peripheral vascular disease, unspecified: Secondary | ICD-10-CM

## 2015-09-11 DIAGNOSIS — E119 Type 2 diabetes mellitus without complications: Secondary | ICD-10-CM | POA: Diagnosis not present

## 2015-09-11 DIAGNOSIS — L97519 Non-pressure chronic ulcer of other part of right foot with unspecified severity: Secondary | ICD-10-CM | POA: Diagnosis not present

## 2015-09-11 DIAGNOSIS — Z87891 Personal history of nicotine dependence: Secondary | ICD-10-CM | POA: Diagnosis not present

## 2015-09-11 DIAGNOSIS — L97529 Non-pressure chronic ulcer of other part of left foot with unspecified severity: Secondary | ICD-10-CM | POA: Diagnosis not present

## 2015-09-11 DIAGNOSIS — I10 Essential (primary) hypertension: Secondary | ICD-10-CM | POA: Diagnosis not present

## 2015-09-11 DIAGNOSIS — I70203 Unspecified atherosclerosis of native arteries of extremities, bilateral legs: Secondary | ICD-10-CM | POA: Diagnosis not present

## 2015-09-12 DIAGNOSIS — N186 End stage renal disease: Secondary | ICD-10-CM | POA: Diagnosis not present

## 2015-09-12 DIAGNOSIS — N2581 Secondary hyperparathyroidism of renal origin: Secondary | ICD-10-CM | POA: Diagnosis not present

## 2015-09-12 DIAGNOSIS — Z23 Encounter for immunization: Secondary | ICD-10-CM | POA: Diagnosis not present

## 2015-09-12 DIAGNOSIS — E1121 Type 2 diabetes mellitus with diabetic nephropathy: Secondary | ICD-10-CM | POA: Diagnosis not present

## 2015-09-15 DIAGNOSIS — Z23 Encounter for immunization: Secondary | ICD-10-CM | POA: Diagnosis not present

## 2015-09-15 DIAGNOSIS — E1121 Type 2 diabetes mellitus with diabetic nephropathy: Secondary | ICD-10-CM | POA: Diagnosis not present

## 2015-09-15 DIAGNOSIS — N186 End stage renal disease: Secondary | ICD-10-CM | POA: Diagnosis not present

## 2015-09-15 DIAGNOSIS — N2581 Secondary hyperparathyroidism of renal origin: Secondary | ICD-10-CM | POA: Diagnosis not present

## 2015-09-17 DIAGNOSIS — N2581 Secondary hyperparathyroidism of renal origin: Secondary | ICD-10-CM | POA: Diagnosis not present

## 2015-09-17 DIAGNOSIS — N186 End stage renal disease: Secondary | ICD-10-CM | POA: Diagnosis not present

## 2015-09-17 DIAGNOSIS — E1121 Type 2 diabetes mellitus with diabetic nephropathy: Secondary | ICD-10-CM | POA: Diagnosis not present

## 2015-09-17 DIAGNOSIS — Z23 Encounter for immunization: Secondary | ICD-10-CM | POA: Diagnosis not present

## 2015-09-18 DIAGNOSIS — L97519 Non-pressure chronic ulcer of other part of right foot with unspecified severity: Secondary | ICD-10-CM | POA: Diagnosis not present

## 2015-09-18 DIAGNOSIS — L97529 Non-pressure chronic ulcer of other part of left foot with unspecified severity: Secondary | ICD-10-CM | POA: Diagnosis not present

## 2015-09-19 DIAGNOSIS — N2581 Secondary hyperparathyroidism of renal origin: Secondary | ICD-10-CM | POA: Diagnosis not present

## 2015-09-19 DIAGNOSIS — N186 End stage renal disease: Secondary | ICD-10-CM | POA: Diagnosis not present

## 2015-09-19 DIAGNOSIS — E1121 Type 2 diabetes mellitus with diabetic nephropathy: Secondary | ICD-10-CM | POA: Diagnosis not present

## 2015-09-19 DIAGNOSIS — Z23 Encounter for immunization: Secondary | ICD-10-CM | POA: Diagnosis not present

## 2015-09-22 DIAGNOSIS — Z23 Encounter for immunization: Secondary | ICD-10-CM | POA: Diagnosis not present

## 2015-09-22 DIAGNOSIS — E1121 Type 2 diabetes mellitus with diabetic nephropathy: Secondary | ICD-10-CM | POA: Diagnosis not present

## 2015-09-22 DIAGNOSIS — N186 End stage renal disease: Secondary | ICD-10-CM | POA: Diagnosis not present

## 2015-09-22 DIAGNOSIS — N2581 Secondary hyperparathyroidism of renal origin: Secondary | ICD-10-CM | POA: Diagnosis not present

## 2015-09-24 DIAGNOSIS — Z23 Encounter for immunization: Secondary | ICD-10-CM | POA: Diagnosis not present

## 2015-09-24 DIAGNOSIS — E1121 Type 2 diabetes mellitus with diabetic nephropathy: Secondary | ICD-10-CM | POA: Diagnosis not present

## 2015-09-24 DIAGNOSIS — N2581 Secondary hyperparathyroidism of renal origin: Secondary | ICD-10-CM | POA: Diagnosis not present

## 2015-09-24 DIAGNOSIS — N186 End stage renal disease: Secondary | ICD-10-CM | POA: Diagnosis not present

## 2015-09-26 ENCOUNTER — Telehealth: Payer: Self-pay | Admitting: Cardiovascular Disease

## 2015-09-26 DIAGNOSIS — E1122 Type 2 diabetes mellitus with diabetic chronic kidney disease: Secondary | ICD-10-CM | POA: Diagnosis not present

## 2015-09-26 DIAGNOSIS — E1121 Type 2 diabetes mellitus with diabetic nephropathy: Secondary | ICD-10-CM | POA: Diagnosis not present

## 2015-09-26 DIAGNOSIS — Z992 Dependence on renal dialysis: Secondary | ICD-10-CM | POA: Diagnosis not present

## 2015-09-26 DIAGNOSIS — N2581 Secondary hyperparathyroidism of renal origin: Secondary | ICD-10-CM | POA: Diagnosis not present

## 2015-09-26 DIAGNOSIS — Z23 Encounter for immunization: Secondary | ICD-10-CM | POA: Diagnosis not present

## 2015-09-26 DIAGNOSIS — N186 End stage renal disease: Secondary | ICD-10-CM | POA: Diagnosis not present

## 2015-09-26 NOTE — Telephone Encounter (Signed)
Pt says he received a call Tuesday or Wednesday,does not know who it was.

## 2015-09-29 DIAGNOSIS — N2581 Secondary hyperparathyroidism of renal origin: Secondary | ICD-10-CM | POA: Diagnosis not present

## 2015-09-29 DIAGNOSIS — N186 End stage renal disease: Secondary | ICD-10-CM | POA: Diagnosis not present

## 2015-09-29 DIAGNOSIS — E1121 Type 2 diabetes mellitus with diabetic nephropathy: Secondary | ICD-10-CM | POA: Diagnosis not present

## 2015-09-30 ENCOUNTER — Ambulatory Visit (INDEPENDENT_AMBULATORY_CARE_PROVIDER_SITE_OTHER): Payer: Medicare Other | Admitting: Cardiovascular Disease

## 2015-09-30 ENCOUNTER — Encounter: Payer: Self-pay | Admitting: Cardiovascular Disease

## 2015-09-30 VITALS — BP 136/80 | HR 83 | Ht 73.0 in | Wt 201.0 lb

## 2015-09-30 DIAGNOSIS — I739 Peripheral vascular disease, unspecified: Secondary | ICD-10-CM

## 2015-09-30 NOTE — Progress Notes (Signed)
[name] was referred to me by Dr. Barkley Bruns for diabetic foot ulcers. He is on offloading boots and his ulcers are healing. Does have diabetic peripheral neuropathy. His arterial Doppler studies performed in our office 09/11/15 revealed normal ABIs bilaterally with a lesion in his right dorsalis pedis and occluded left dorsalis pedis. I think he has enough circulation to heal any wounds. I think it is currently ulcers are related to small vessel disease, diabetic peripheral neuropathy and pressure. There is no indication for intervention. I'll see him back when necessary

## 2015-09-30 NOTE — Assessment & Plan Note (Signed)
[name] was referred to me by Dr. Barkley Bruns for diabetic foot ulcers. He is on offloading boots and his ulcers are healing. Does have diabetic peripheral neuropathy. His arterial Doppler studies performed in our office 09/11/15 revealed normal ABIs bilaterally with a lesion in his right dorsalis pedis and occluded left dorsalis pedis. I think he has enough circulation to heal any wounds. I think it is currently ulcers are related to small vessel disease, diabetic peripheral neuropathy and pressure. There is no indication for intervention. I'll see him back when necessary

## 2015-09-30 NOTE — Patient Instructions (Signed)
Medication Instructions:  Your physician recommends that you continue on your current medications as directed. Please refer to the Current Medication list given to you today.   Labwork: none  Testing/Procedures: none  Follow-Up: Follow up with Dr. Gwenlyn Found as needed.   Any Other Special Instructions Will Be Listed Below (If Applicable).

## 2015-10-01 DIAGNOSIS — E1121 Type 2 diabetes mellitus with diabetic nephropathy: Secondary | ICD-10-CM | POA: Diagnosis not present

## 2015-10-01 DIAGNOSIS — N2581 Secondary hyperparathyroidism of renal origin: Secondary | ICD-10-CM | POA: Diagnosis not present

## 2015-10-01 DIAGNOSIS — N186 End stage renal disease: Secondary | ICD-10-CM | POA: Diagnosis not present

## 2015-10-03 DIAGNOSIS — E1121 Type 2 diabetes mellitus with diabetic nephropathy: Secondary | ICD-10-CM | POA: Diagnosis not present

## 2015-10-03 DIAGNOSIS — N186 End stage renal disease: Secondary | ICD-10-CM | POA: Diagnosis not present

## 2015-10-03 DIAGNOSIS — N2581 Secondary hyperparathyroidism of renal origin: Secondary | ICD-10-CM | POA: Diagnosis not present

## 2015-10-06 DIAGNOSIS — L97519 Non-pressure chronic ulcer of other part of right foot with unspecified severity: Secondary | ICD-10-CM | POA: Diagnosis not present

## 2015-10-06 DIAGNOSIS — N2581 Secondary hyperparathyroidism of renal origin: Secondary | ICD-10-CM | POA: Diagnosis not present

## 2015-10-06 DIAGNOSIS — N186 End stage renal disease: Secondary | ICD-10-CM | POA: Diagnosis not present

## 2015-10-06 DIAGNOSIS — L97529 Non-pressure chronic ulcer of other part of left foot with unspecified severity: Secondary | ICD-10-CM | POA: Diagnosis not present

## 2015-10-06 DIAGNOSIS — E1121 Type 2 diabetes mellitus with diabetic nephropathy: Secondary | ICD-10-CM | POA: Diagnosis not present

## 2015-10-08 DIAGNOSIS — E1121 Type 2 diabetes mellitus with diabetic nephropathy: Secondary | ICD-10-CM | POA: Diagnosis not present

## 2015-10-08 DIAGNOSIS — N2581 Secondary hyperparathyroidism of renal origin: Secondary | ICD-10-CM | POA: Diagnosis not present

## 2015-10-08 DIAGNOSIS — N186 End stage renal disease: Secondary | ICD-10-CM | POA: Diagnosis not present

## 2015-10-10 DIAGNOSIS — N186 End stage renal disease: Secondary | ICD-10-CM | POA: Diagnosis not present

## 2015-10-10 DIAGNOSIS — E1121 Type 2 diabetes mellitus with diabetic nephropathy: Secondary | ICD-10-CM | POA: Diagnosis not present

## 2015-10-10 DIAGNOSIS — N2581 Secondary hyperparathyroidism of renal origin: Secondary | ICD-10-CM | POA: Diagnosis not present

## 2015-10-13 DIAGNOSIS — N186 End stage renal disease: Secondary | ICD-10-CM | POA: Diagnosis not present

## 2015-10-13 DIAGNOSIS — E1121 Type 2 diabetes mellitus with diabetic nephropathy: Secondary | ICD-10-CM | POA: Diagnosis not present

## 2015-10-13 DIAGNOSIS — N2581 Secondary hyperparathyroidism of renal origin: Secondary | ICD-10-CM | POA: Diagnosis not present

## 2015-10-15 DIAGNOSIS — N186 End stage renal disease: Secondary | ICD-10-CM | POA: Diagnosis not present

## 2015-10-15 DIAGNOSIS — N2581 Secondary hyperparathyroidism of renal origin: Secondary | ICD-10-CM | POA: Diagnosis not present

## 2015-10-15 DIAGNOSIS — E1121 Type 2 diabetes mellitus with diabetic nephropathy: Secondary | ICD-10-CM | POA: Diagnosis not present

## 2015-10-17 DIAGNOSIS — N2581 Secondary hyperparathyroidism of renal origin: Secondary | ICD-10-CM | POA: Diagnosis not present

## 2015-10-17 DIAGNOSIS — E1121 Type 2 diabetes mellitus with diabetic nephropathy: Secondary | ICD-10-CM | POA: Diagnosis not present

## 2015-10-17 DIAGNOSIS — N186 End stage renal disease: Secondary | ICD-10-CM | POA: Diagnosis not present

## 2015-10-20 DIAGNOSIS — E1121 Type 2 diabetes mellitus with diabetic nephropathy: Secondary | ICD-10-CM | POA: Diagnosis not present

## 2015-10-20 DIAGNOSIS — N186 End stage renal disease: Secondary | ICD-10-CM | POA: Diagnosis not present

## 2015-10-20 DIAGNOSIS — N2581 Secondary hyperparathyroidism of renal origin: Secondary | ICD-10-CM | POA: Diagnosis not present

## 2015-10-22 DIAGNOSIS — N2581 Secondary hyperparathyroidism of renal origin: Secondary | ICD-10-CM | POA: Diagnosis not present

## 2015-10-22 DIAGNOSIS — E1121 Type 2 diabetes mellitus with diabetic nephropathy: Secondary | ICD-10-CM | POA: Diagnosis not present

## 2015-10-22 DIAGNOSIS — N186 End stage renal disease: Secondary | ICD-10-CM | POA: Diagnosis not present

## 2015-10-24 DIAGNOSIS — N2581 Secondary hyperparathyroidism of renal origin: Secondary | ICD-10-CM | POA: Diagnosis not present

## 2015-10-24 DIAGNOSIS — E1121 Type 2 diabetes mellitus with diabetic nephropathy: Secondary | ICD-10-CM | POA: Diagnosis not present

## 2015-10-24 DIAGNOSIS — N186 End stage renal disease: Secondary | ICD-10-CM | POA: Diagnosis not present

## 2015-10-27 ENCOUNTER — Encounter (HOSPITAL_BASED_OUTPATIENT_CLINIC_OR_DEPARTMENT_OTHER): Payer: Medicare Other | Attending: Plastic Surgery

## 2015-10-27 DIAGNOSIS — N186 End stage renal disease: Secondary | ICD-10-CM | POA: Diagnosis not present

## 2015-10-27 DIAGNOSIS — L97422 Non-pressure chronic ulcer of left heel and midfoot with fat layer exposed: Secondary | ICD-10-CM | POA: Diagnosis not present

## 2015-10-27 DIAGNOSIS — I5020 Unspecified systolic (congestive) heart failure: Secondary | ICD-10-CM | POA: Insufficient documentation

## 2015-10-27 DIAGNOSIS — E11621 Type 2 diabetes mellitus with foot ulcer: Secondary | ICD-10-CM | POA: Insufficient documentation

## 2015-10-27 DIAGNOSIS — Z794 Long term (current) use of insulin: Secondary | ICD-10-CM | POA: Insufficient documentation

## 2015-10-27 DIAGNOSIS — Z9842 Cataract extraction status, left eye: Secondary | ICD-10-CM | POA: Diagnosis not present

## 2015-10-27 DIAGNOSIS — I429 Cardiomyopathy, unspecified: Secondary | ICD-10-CM | POA: Diagnosis not present

## 2015-10-27 DIAGNOSIS — Z89422 Acquired absence of other left toe(s): Secondary | ICD-10-CM | POA: Insufficient documentation

## 2015-10-27 DIAGNOSIS — L97412 Non-pressure chronic ulcer of right heel and midfoot with fat layer exposed: Secondary | ICD-10-CM | POA: Diagnosis not present

## 2015-10-27 DIAGNOSIS — E1151 Type 2 diabetes mellitus with diabetic peripheral angiopathy without gangrene: Secondary | ICD-10-CM | POA: Diagnosis not present

## 2015-10-27 DIAGNOSIS — E1122 Type 2 diabetes mellitus with diabetic chronic kidney disease: Secondary | ICD-10-CM | POA: Insufficient documentation

## 2015-10-27 DIAGNOSIS — N2581 Secondary hyperparathyroidism of renal origin: Secondary | ICD-10-CM | POA: Diagnosis not present

## 2015-10-27 DIAGNOSIS — M1990 Unspecified osteoarthritis, unspecified site: Secondary | ICD-10-CM | POA: Diagnosis not present

## 2015-10-27 DIAGNOSIS — I132 Hypertensive heart and chronic kidney disease with heart failure and with stage 5 chronic kidney disease, or end stage renal disease: Secondary | ICD-10-CM | POA: Diagnosis not present

## 2015-10-27 DIAGNOSIS — I471 Supraventricular tachycardia: Secondary | ICD-10-CM | POA: Diagnosis not present

## 2015-10-27 DIAGNOSIS — Z87891 Personal history of nicotine dependence: Secondary | ICD-10-CM | POA: Diagnosis not present

## 2015-10-27 DIAGNOSIS — E1121 Type 2 diabetes mellitus with diabetic nephropathy: Secondary | ICD-10-CM | POA: Diagnosis not present

## 2015-10-27 DIAGNOSIS — E1140 Type 2 diabetes mellitus with diabetic neuropathy, unspecified: Secondary | ICD-10-CM | POA: Diagnosis not present

## 2015-10-27 DIAGNOSIS — Z992 Dependence on renal dialysis: Secondary | ICD-10-CM | POA: Diagnosis not present

## 2015-10-27 LAB — GLUCOSE, CAPILLARY: Glucose-Capillary: 157 mg/dL — ABNORMAL HIGH (ref 65–99)

## 2015-10-29 DIAGNOSIS — N2581 Secondary hyperparathyroidism of renal origin: Secondary | ICD-10-CM | POA: Diagnosis not present

## 2015-10-29 DIAGNOSIS — N186 End stage renal disease: Secondary | ICD-10-CM | POA: Diagnosis not present

## 2015-10-29 DIAGNOSIS — Z23 Encounter for immunization: Secondary | ICD-10-CM | POA: Diagnosis not present

## 2015-10-30 ENCOUNTER — Ambulatory Visit (HOSPITAL_COMMUNITY)
Admission: RE | Admit: 2015-10-30 | Discharge: 2015-10-30 | Disposition: A | Discharge status: Home / Self Care | Payer: Medicare Other | Source: Ambulatory Visit | Attending: Plastic Surgery | Admitting: Plastic Surgery

## 2015-10-30 ENCOUNTER — Other Ambulatory Visit (HOSPITAL_COMMUNITY): Payer: Self-pay | Admitting: Plastic Surgery

## 2015-10-30 DIAGNOSIS — Z89422 Acquired absence of other left toe(s): Secondary | ICD-10-CM | POA: Insufficient documentation

## 2015-10-30 DIAGNOSIS — I739 Peripheral vascular disease, unspecified: Secondary | ICD-10-CM | POA: Diagnosis not present

## 2015-10-30 DIAGNOSIS — M869 Osteomyelitis, unspecified: Secondary | ICD-10-CM | POA: Insufficient documentation

## 2015-10-30 DIAGNOSIS — S91301A Unspecified open wound, right foot, initial encounter: Secondary | ICD-10-CM | POA: Diagnosis not present

## 2015-10-31 DIAGNOSIS — Z23 Encounter for immunization: Secondary | ICD-10-CM | POA: Diagnosis not present

## 2015-10-31 DIAGNOSIS — N186 End stage renal disease: Secondary | ICD-10-CM | POA: Diagnosis not present

## 2015-10-31 DIAGNOSIS — N2581 Secondary hyperparathyroidism of renal origin: Secondary | ICD-10-CM | POA: Diagnosis not present

## 2015-11-03 DIAGNOSIS — N186 End stage renal disease: Secondary | ICD-10-CM | POA: Diagnosis not present

## 2015-11-03 DIAGNOSIS — N2581 Secondary hyperparathyroidism of renal origin: Secondary | ICD-10-CM | POA: Diagnosis not present

## 2015-11-03 DIAGNOSIS — Z23 Encounter for immunization: Secondary | ICD-10-CM | POA: Diagnosis not present

## 2015-11-04 ENCOUNTER — Encounter (HOSPITAL_BASED_OUTPATIENT_CLINIC_OR_DEPARTMENT_OTHER): Payer: Medicare Other | Attending: General Surgery

## 2015-11-04 ENCOUNTER — Other Ambulatory Visit (HOSPITAL_BASED_OUTPATIENT_CLINIC_OR_DEPARTMENT_OTHER): Payer: Self-pay | Admitting: General Surgery

## 2015-11-04 DIAGNOSIS — E1136 Type 2 diabetes mellitus with diabetic cataract: Secondary | ICD-10-CM | POA: Diagnosis not present

## 2015-11-04 DIAGNOSIS — L97412 Non-pressure chronic ulcer of right heel and midfoot with fat layer exposed: Secondary | ICD-10-CM | POA: Insufficient documentation

## 2015-11-04 DIAGNOSIS — I509 Heart failure, unspecified: Secondary | ICD-10-CM | POA: Insufficient documentation

## 2015-11-04 DIAGNOSIS — E1169 Type 2 diabetes mellitus with other specified complication: Secondary | ICD-10-CM | POA: Diagnosis not present

## 2015-11-04 DIAGNOSIS — L97514 Non-pressure chronic ulcer of other part of right foot with necrosis of bone: Secondary | ICD-10-CM | POA: Diagnosis not present

## 2015-11-04 DIAGNOSIS — L97422 Non-pressure chronic ulcer of left heel and midfoot with fat layer exposed: Secondary | ICD-10-CM | POA: Diagnosis not present

## 2015-11-04 DIAGNOSIS — N186 End stage renal disease: Secondary | ICD-10-CM | POA: Diagnosis not present

## 2015-11-04 DIAGNOSIS — I132 Hypertensive heart and chronic kidney disease with heart failure and with stage 5 chronic kidney disease, or end stage renal disease: Secondary | ICD-10-CM | POA: Diagnosis not present

## 2015-11-04 DIAGNOSIS — M869 Osteomyelitis, unspecified: Secondary | ICD-10-CM | POA: Insufficient documentation

## 2015-11-04 DIAGNOSIS — Z992 Dependence on renal dialysis: Secondary | ICD-10-CM | POA: Diagnosis not present

## 2015-11-04 DIAGNOSIS — E1140 Type 2 diabetes mellitus with diabetic neuropathy, unspecified: Secondary | ICD-10-CM | POA: Diagnosis not present

## 2015-11-04 DIAGNOSIS — E1151 Type 2 diabetes mellitus with diabetic peripheral angiopathy without gangrene: Secondary | ICD-10-CM | POA: Insufficient documentation

## 2015-11-04 DIAGNOSIS — M1990 Unspecified osteoarthritis, unspecified site: Secondary | ICD-10-CM | POA: Insufficient documentation

## 2015-11-04 DIAGNOSIS — E1122 Type 2 diabetes mellitus with diabetic chronic kidney disease: Secondary | ICD-10-CM | POA: Diagnosis not present

## 2015-11-04 DIAGNOSIS — E11621 Type 2 diabetes mellitus with foot ulcer: Secondary | ICD-10-CM | POA: Insufficient documentation

## 2015-11-04 DIAGNOSIS — L84 Corns and callosities: Secondary | ICD-10-CM | POA: Diagnosis not present

## 2015-11-05 DIAGNOSIS — N2581 Secondary hyperparathyroidism of renal origin: Secondary | ICD-10-CM | POA: Diagnosis not present

## 2015-11-05 DIAGNOSIS — Z23 Encounter for immunization: Secondary | ICD-10-CM | POA: Diagnosis not present

## 2015-11-05 DIAGNOSIS — N186 End stage renal disease: Secondary | ICD-10-CM | POA: Diagnosis not present

## 2015-11-07 DIAGNOSIS — N2581 Secondary hyperparathyroidism of renal origin: Secondary | ICD-10-CM | POA: Diagnosis not present

## 2015-11-07 DIAGNOSIS — N186 End stage renal disease: Secondary | ICD-10-CM | POA: Diagnosis not present

## 2015-11-07 DIAGNOSIS — Z23 Encounter for immunization: Secondary | ICD-10-CM | POA: Diagnosis not present

## 2015-11-10 DIAGNOSIS — Z23 Encounter for immunization: Secondary | ICD-10-CM | POA: Diagnosis not present

## 2015-11-10 DIAGNOSIS — N186 End stage renal disease: Secondary | ICD-10-CM | POA: Diagnosis not present

## 2015-11-10 DIAGNOSIS — N2581 Secondary hyperparathyroidism of renal origin: Secondary | ICD-10-CM | POA: Diagnosis not present

## 2015-11-11 DIAGNOSIS — E11621 Type 2 diabetes mellitus with foot ulcer: Secondary | ICD-10-CM | POA: Diagnosis not present

## 2015-11-11 DIAGNOSIS — L97412 Non-pressure chronic ulcer of right heel and midfoot with fat layer exposed: Secondary | ICD-10-CM | POA: Diagnosis not present

## 2015-11-11 DIAGNOSIS — E1122 Type 2 diabetes mellitus with diabetic chronic kidney disease: Secondary | ICD-10-CM | POA: Diagnosis not present

## 2015-11-11 DIAGNOSIS — E1136 Type 2 diabetes mellitus with diabetic cataract: Secondary | ICD-10-CM | POA: Diagnosis not present

## 2015-11-11 DIAGNOSIS — Z992 Dependence on renal dialysis: Secondary | ICD-10-CM | POA: Diagnosis not present

## 2015-11-11 DIAGNOSIS — L97422 Non-pressure chronic ulcer of left heel and midfoot with fat layer exposed: Secondary | ICD-10-CM | POA: Diagnosis not present

## 2015-11-12 DIAGNOSIS — Z23 Encounter for immunization: Secondary | ICD-10-CM | POA: Diagnosis not present

## 2015-11-12 DIAGNOSIS — N186 End stage renal disease: Secondary | ICD-10-CM | POA: Diagnosis not present

## 2015-11-12 DIAGNOSIS — N2581 Secondary hyperparathyroidism of renal origin: Secondary | ICD-10-CM | POA: Diagnosis not present

## 2015-11-13 ENCOUNTER — Other Ambulatory Visit (HOSPITAL_COMMUNITY): Payer: Medicare Other

## 2015-11-14 DIAGNOSIS — N186 End stage renal disease: Secondary | ICD-10-CM | POA: Diagnosis not present

## 2015-11-14 DIAGNOSIS — Z23 Encounter for immunization: Secondary | ICD-10-CM | POA: Diagnosis not present

## 2015-11-14 DIAGNOSIS — N2581 Secondary hyperparathyroidism of renal origin: Secondary | ICD-10-CM | POA: Diagnosis not present

## 2015-11-17 DIAGNOSIS — N186 End stage renal disease: Secondary | ICD-10-CM | POA: Diagnosis not present

## 2015-11-17 DIAGNOSIS — N2581 Secondary hyperparathyroidism of renal origin: Secondary | ICD-10-CM | POA: Diagnosis not present

## 2015-11-17 DIAGNOSIS — Z23 Encounter for immunization: Secondary | ICD-10-CM | POA: Diagnosis not present

## 2015-11-18 ENCOUNTER — Other Ambulatory Visit (HOSPITAL_BASED_OUTPATIENT_CLINIC_OR_DEPARTMENT_OTHER): Payer: Self-pay | Admitting: General Surgery

## 2015-11-18 ENCOUNTER — Ambulatory Visit (HOSPITAL_COMMUNITY)
Admission: RE | Admit: 2015-11-18 | Discharge: 2015-11-18 | Disposition: A | Discharge status: Home / Self Care | Payer: Medicare Other | Source: Ambulatory Visit | Attending: General Surgery | Admitting: General Surgery

## 2015-11-18 DIAGNOSIS — M869 Osteomyelitis, unspecified: Secondary | ICD-10-CM

## 2015-11-18 DIAGNOSIS — L97529 Non-pressure chronic ulcer of other part of left foot with unspecified severity: Secondary | ICD-10-CM | POA: Insufficient documentation

## 2015-11-18 DIAGNOSIS — M79672 Pain in left foot: Secondary | ICD-10-CM | POA: Diagnosis not present

## 2015-11-18 DIAGNOSIS — L97412 Non-pressure chronic ulcer of right heel and midfoot with fat layer exposed: Secondary | ICD-10-CM | POA: Diagnosis not present

## 2015-11-18 DIAGNOSIS — E11621 Type 2 diabetes mellitus with foot ulcer: Secondary | ICD-10-CM | POA: Diagnosis not present

## 2015-11-18 DIAGNOSIS — L97422 Non-pressure chronic ulcer of left heel and midfoot with fat layer exposed: Secondary | ICD-10-CM | POA: Diagnosis not present

## 2015-11-18 DIAGNOSIS — E1122 Type 2 diabetes mellitus with diabetic chronic kidney disease: Secondary | ICD-10-CM | POA: Diagnosis not present

## 2015-11-18 DIAGNOSIS — E1136 Type 2 diabetes mellitus with diabetic cataract: Secondary | ICD-10-CM | POA: Diagnosis not present

## 2015-11-18 DIAGNOSIS — Z992 Dependence on renal dialysis: Secondary | ICD-10-CM | POA: Diagnosis not present

## 2015-11-18 DIAGNOSIS — L97509 Non-pressure chronic ulcer of other part of unspecified foot with unspecified severity: Secondary | ICD-10-CM | POA: Diagnosis not present

## 2015-11-18 LAB — POCT I-STAT, CHEM 8
BUN: 41 mg/dL — AB (ref 6–20)
CALCIUM ION: 1.11 mmol/L — AB (ref 1.13–1.30)
CHLORIDE: 99 mmol/L — AB (ref 101–111)
Creatinine, Ser: 9.1 mg/dL — ABNORMAL HIGH (ref 0.61–1.24)
GLUCOSE: 166 mg/dL — AB (ref 65–99)
HCT: 48 % (ref 39.0–52.0)
Hemoglobin: 16.3 g/dL (ref 13.0–17.0)
POTASSIUM: 4.3 mmol/L (ref 3.5–5.1)
Sodium: 139 mmol/L (ref 135–145)
TCO2: 29 mmol/L (ref 0–100)

## 2015-11-19 DIAGNOSIS — N186 End stage renal disease: Secondary | ICD-10-CM | POA: Diagnosis not present

## 2015-11-19 DIAGNOSIS — N2581 Secondary hyperparathyroidism of renal origin: Secondary | ICD-10-CM | POA: Diagnosis not present

## 2015-11-19 DIAGNOSIS — Z23 Encounter for immunization: Secondary | ICD-10-CM | POA: Diagnosis not present

## 2015-11-22 DIAGNOSIS — N186 End stage renal disease: Secondary | ICD-10-CM | POA: Diagnosis not present

## 2015-11-22 DIAGNOSIS — Z23 Encounter for immunization: Secondary | ICD-10-CM | POA: Diagnosis not present

## 2015-11-22 DIAGNOSIS — N2581 Secondary hyperparathyroidism of renal origin: Secondary | ICD-10-CM | POA: Diagnosis not present

## 2015-11-24 DIAGNOSIS — N186 End stage renal disease: Secondary | ICD-10-CM | POA: Diagnosis not present

## 2015-11-24 DIAGNOSIS — Z23 Encounter for immunization: Secondary | ICD-10-CM | POA: Diagnosis not present

## 2015-11-24 DIAGNOSIS — N2581 Secondary hyperparathyroidism of renal origin: Secondary | ICD-10-CM | POA: Diagnosis not present

## 2015-11-25 ENCOUNTER — Other Ambulatory Visit: Payer: Self-pay

## 2015-11-25 DIAGNOSIS — M86671 Other chronic osteomyelitis, right ankle and foot: Secondary | ICD-10-CM | POA: Diagnosis not present

## 2015-11-25 DIAGNOSIS — E1122 Type 2 diabetes mellitus with diabetic chronic kidney disease: Secondary | ICD-10-CM | POA: Diagnosis not present

## 2015-11-25 DIAGNOSIS — L97412 Non-pressure chronic ulcer of right heel and midfoot with fat layer exposed: Secondary | ICD-10-CM | POA: Diagnosis not present

## 2015-11-25 DIAGNOSIS — Z992 Dependence on renal dialysis: Secondary | ICD-10-CM | POA: Diagnosis not present

## 2015-11-25 DIAGNOSIS — E11621 Type 2 diabetes mellitus with foot ulcer: Secondary | ICD-10-CM | POA: Diagnosis not present

## 2015-11-25 DIAGNOSIS — L97514 Non-pressure chronic ulcer of other part of right foot with necrosis of bone: Secondary | ICD-10-CM | POA: Diagnosis not present

## 2015-11-25 DIAGNOSIS — E1136 Type 2 diabetes mellitus with diabetic cataract: Secondary | ICD-10-CM | POA: Diagnosis not present

## 2015-11-25 DIAGNOSIS — L97422 Non-pressure chronic ulcer of left heel and midfoot with fat layer exposed: Secondary | ICD-10-CM | POA: Diagnosis not present

## 2015-11-26 DIAGNOSIS — N186 End stage renal disease: Secondary | ICD-10-CM | POA: Diagnosis not present

## 2015-11-26 DIAGNOSIS — Z992 Dependence on renal dialysis: Secondary | ICD-10-CM | POA: Diagnosis not present

## 2015-11-26 DIAGNOSIS — E1122 Type 2 diabetes mellitus with diabetic chronic kidney disease: Secondary | ICD-10-CM | POA: Diagnosis not present

## 2015-11-26 DIAGNOSIS — Z23 Encounter for immunization: Secondary | ICD-10-CM | POA: Diagnosis not present

## 2015-11-26 DIAGNOSIS — N2581 Secondary hyperparathyroidism of renal origin: Secondary | ICD-10-CM | POA: Diagnosis not present

## 2015-11-28 DIAGNOSIS — Z23 Encounter for immunization: Secondary | ICD-10-CM | POA: Diagnosis not present

## 2015-11-28 DIAGNOSIS — N186 End stage renal disease: Secondary | ICD-10-CM | POA: Diagnosis not present

## 2015-11-28 DIAGNOSIS — N2581 Secondary hyperparathyroidism of renal origin: Secondary | ICD-10-CM | POA: Diagnosis not present

## 2015-11-28 DIAGNOSIS — E1121 Type 2 diabetes mellitus with diabetic nephropathy: Secondary | ICD-10-CM | POA: Diagnosis not present

## 2015-12-01 DIAGNOSIS — E1121 Type 2 diabetes mellitus with diabetic nephropathy: Secondary | ICD-10-CM | POA: Diagnosis not present

## 2015-12-01 DIAGNOSIS — Z23 Encounter for immunization: Secondary | ICD-10-CM | POA: Diagnosis not present

## 2015-12-01 DIAGNOSIS — N186 End stage renal disease: Secondary | ICD-10-CM | POA: Diagnosis not present

## 2015-12-01 DIAGNOSIS — N2581 Secondary hyperparathyroidism of renal origin: Secondary | ICD-10-CM | POA: Diagnosis not present

## 2015-12-02 ENCOUNTER — Encounter (HOSPITAL_BASED_OUTPATIENT_CLINIC_OR_DEPARTMENT_OTHER): Payer: Medicare Other | Attending: General Surgery

## 2015-12-02 DIAGNOSIS — L97414 Non-pressure chronic ulcer of right heel and midfoot with necrosis of bone: Secondary | ICD-10-CM | POA: Insufficient documentation

## 2015-12-02 DIAGNOSIS — Z992 Dependence on renal dialysis: Secondary | ICD-10-CM | POA: Insufficient documentation

## 2015-12-02 DIAGNOSIS — I509 Heart failure, unspecified: Secondary | ICD-10-CM | POA: Insufficient documentation

## 2015-12-02 DIAGNOSIS — M1990 Unspecified osteoarthritis, unspecified site: Secondary | ICD-10-CM | POA: Diagnosis not present

## 2015-12-02 DIAGNOSIS — E1122 Type 2 diabetes mellitus with diabetic chronic kidney disease: Secondary | ICD-10-CM | POA: Insufficient documentation

## 2015-12-02 DIAGNOSIS — L97421 Non-pressure chronic ulcer of left heel and midfoot limited to breakdown of skin: Secondary | ICD-10-CM | POA: Diagnosis not present

## 2015-12-02 DIAGNOSIS — E1136 Type 2 diabetes mellitus with diabetic cataract: Secondary | ICD-10-CM | POA: Diagnosis not present

## 2015-12-02 DIAGNOSIS — I132 Hypertensive heart and chronic kidney disease with heart failure and with stage 5 chronic kidney disease, or end stage renal disease: Secondary | ICD-10-CM | POA: Diagnosis not present

## 2015-12-02 DIAGNOSIS — E11621 Type 2 diabetes mellitus with foot ulcer: Secondary | ICD-10-CM | POA: Diagnosis not present

## 2015-12-02 DIAGNOSIS — E1140 Type 2 diabetes mellitus with diabetic neuropathy, unspecified: Secondary | ICD-10-CM | POA: Diagnosis not present

## 2015-12-02 DIAGNOSIS — N186 End stage renal disease: Secondary | ICD-10-CM | POA: Diagnosis not present

## 2015-12-02 DIAGNOSIS — M79671 Pain in right foot: Secondary | ICD-10-CM | POA: Diagnosis not present

## 2015-12-03 DIAGNOSIS — N186 End stage renal disease: Secondary | ICD-10-CM | POA: Diagnosis not present

## 2015-12-03 DIAGNOSIS — N2581 Secondary hyperparathyroidism of renal origin: Secondary | ICD-10-CM | POA: Diagnosis not present

## 2015-12-03 DIAGNOSIS — E1121 Type 2 diabetes mellitus with diabetic nephropathy: Secondary | ICD-10-CM | POA: Diagnosis not present

## 2015-12-03 DIAGNOSIS — Z23 Encounter for immunization: Secondary | ICD-10-CM | POA: Diagnosis not present

## 2015-12-05 DIAGNOSIS — N186 End stage renal disease: Secondary | ICD-10-CM | POA: Diagnosis not present

## 2015-12-05 DIAGNOSIS — Z23 Encounter for immunization: Secondary | ICD-10-CM | POA: Diagnosis not present

## 2015-12-05 DIAGNOSIS — N2581 Secondary hyperparathyroidism of renal origin: Secondary | ICD-10-CM | POA: Diagnosis not present

## 2015-12-05 DIAGNOSIS — E1121 Type 2 diabetes mellitus with diabetic nephropathy: Secondary | ICD-10-CM | POA: Diagnosis not present

## 2015-12-08 DIAGNOSIS — Z23 Encounter for immunization: Secondary | ICD-10-CM | POA: Diagnosis not present

## 2015-12-08 DIAGNOSIS — N186 End stage renal disease: Secondary | ICD-10-CM | POA: Diagnosis not present

## 2015-12-08 DIAGNOSIS — E1121 Type 2 diabetes mellitus with diabetic nephropathy: Secondary | ICD-10-CM | POA: Diagnosis not present

## 2015-12-08 DIAGNOSIS — N2581 Secondary hyperparathyroidism of renal origin: Secondary | ICD-10-CM | POA: Diagnosis not present

## 2015-12-09 ENCOUNTER — Telehealth: Payer: Self-pay | Admitting: Cardiology

## 2015-12-09 NOTE — Telephone Encounter (Signed)
Patient is needing a cardiac clearance for amputation of 5th toe on right foot, due to bone infection. Will forward to Dr. Stanford Breed, and to Dr. Gwenlyn Found since patient last was seen by him.

## 2015-12-09 NOTE — Telephone Encounter (Signed)
Pt was scheduled for surgery,it was cancelled because they wanted him to have cardiac clearance. Does he need to come in for an appointment? He saw Dr Stanford Breed last in February and Dr Gwenlyn Found in October. He is having the 5th toe on his right foot amputated,due to an infection.

## 2015-12-09 NOTE — Telephone Encounter (Signed)
Ok for procedure [employer]

## 2015-12-09 NOTE — Telephone Encounter (Signed)
Spoke with pt, aware okay for procedure. Note forwarded to dr graves at sports medicine

## 2015-12-10 DIAGNOSIS — N2581 Secondary hyperparathyroidism of renal origin: Secondary | ICD-10-CM | POA: Diagnosis not present

## 2015-12-10 DIAGNOSIS — Z23 Encounter for immunization: Secondary | ICD-10-CM | POA: Diagnosis not present

## 2015-12-10 DIAGNOSIS — N186 End stage renal disease: Secondary | ICD-10-CM | POA: Diagnosis not present

## 2015-12-10 DIAGNOSIS — E1121 Type 2 diabetes mellitus with diabetic nephropathy: Secondary | ICD-10-CM | POA: Diagnosis not present

## 2015-12-12 DIAGNOSIS — E1121 Type 2 diabetes mellitus with diabetic nephropathy: Secondary | ICD-10-CM | POA: Diagnosis not present

## 2015-12-12 DIAGNOSIS — N2581 Secondary hyperparathyroidism of renal origin: Secondary | ICD-10-CM | POA: Diagnosis not present

## 2015-12-12 DIAGNOSIS — Z23 Encounter for immunization: Secondary | ICD-10-CM | POA: Diagnosis not present

## 2015-12-12 DIAGNOSIS — N186 End stage renal disease: Secondary | ICD-10-CM | POA: Diagnosis not present

## 2015-12-15 DIAGNOSIS — M86471 Chronic osteomyelitis with draining sinus, right ankle and foot: Secondary | ICD-10-CM | POA: Diagnosis not present

## 2015-12-15 DIAGNOSIS — L97511 Non-pressure chronic ulcer of other part of right foot limited to breakdown of skin: Secondary | ICD-10-CM | POA: Diagnosis not present

## 2015-12-15 DIAGNOSIS — L98499 Non-pressure chronic ulcer of skin of other sites with unspecified severity: Secondary | ICD-10-CM | POA: Diagnosis not present

## 2015-12-15 DIAGNOSIS — L97521 Non-pressure chronic ulcer of other part of left foot limited to breakdown of skin: Secondary | ICD-10-CM | POA: Diagnosis not present

## 2015-12-15 DIAGNOSIS — M86671 Other chronic osteomyelitis, right ankle and foot: Secondary | ICD-10-CM | POA: Diagnosis not present

## 2015-12-16 DIAGNOSIS — Z23 Encounter for immunization: Secondary | ICD-10-CM | POA: Diagnosis not present

## 2015-12-16 DIAGNOSIS — N186 End stage renal disease: Secondary | ICD-10-CM | POA: Diagnosis not present

## 2015-12-16 DIAGNOSIS — E1121 Type 2 diabetes mellitus with diabetic nephropathy: Secondary | ICD-10-CM | POA: Diagnosis not present

## 2015-12-16 DIAGNOSIS — N2581 Secondary hyperparathyroidism of renal origin: Secondary | ICD-10-CM | POA: Diagnosis not present

## 2015-12-17 DIAGNOSIS — Z23 Encounter for immunization: Secondary | ICD-10-CM | POA: Diagnosis not present

## 2015-12-17 DIAGNOSIS — E1121 Type 2 diabetes mellitus with diabetic nephropathy: Secondary | ICD-10-CM | POA: Diagnosis not present

## 2015-12-17 DIAGNOSIS — N2581 Secondary hyperparathyroidism of renal origin: Secondary | ICD-10-CM | POA: Diagnosis not present

## 2015-12-17 DIAGNOSIS — N186 End stage renal disease: Secondary | ICD-10-CM | POA: Diagnosis not present

## 2015-12-19 DIAGNOSIS — N2581 Secondary hyperparathyroidism of renal origin: Secondary | ICD-10-CM | POA: Diagnosis not present

## 2015-12-19 DIAGNOSIS — N186 End stage renal disease: Secondary | ICD-10-CM | POA: Diagnosis not present

## 2015-12-19 DIAGNOSIS — E1121 Type 2 diabetes mellitus with diabetic nephropathy: Secondary | ICD-10-CM | POA: Diagnosis not present

## 2015-12-19 DIAGNOSIS — Z23 Encounter for immunization: Secondary | ICD-10-CM | POA: Diagnosis not present

## 2015-12-22 DIAGNOSIS — N186 End stage renal disease: Secondary | ICD-10-CM | POA: Diagnosis not present

## 2015-12-22 DIAGNOSIS — Z23 Encounter for immunization: Secondary | ICD-10-CM | POA: Diagnosis not present

## 2015-12-22 DIAGNOSIS — N2581 Secondary hyperparathyroidism of renal origin: Secondary | ICD-10-CM | POA: Diagnosis not present

## 2015-12-22 DIAGNOSIS — E1121 Type 2 diabetes mellitus with diabetic nephropathy: Secondary | ICD-10-CM | POA: Diagnosis not present

## 2015-12-24 DIAGNOSIS — Z23 Encounter for immunization: Secondary | ICD-10-CM | POA: Diagnosis not present

## 2015-12-24 DIAGNOSIS — N2581 Secondary hyperparathyroidism of renal origin: Secondary | ICD-10-CM | POA: Diagnosis not present

## 2015-12-24 DIAGNOSIS — E1121 Type 2 diabetes mellitus with diabetic nephropathy: Secondary | ICD-10-CM | POA: Diagnosis not present

## 2015-12-24 DIAGNOSIS — N186 End stage renal disease: Secondary | ICD-10-CM | POA: Diagnosis not present

## 2015-12-25 DIAGNOSIS — M869 Osteomyelitis, unspecified: Secondary | ICD-10-CM | POA: Diagnosis not present

## 2015-12-25 DIAGNOSIS — L89894 Pressure ulcer of other site, stage 4: Secondary | ICD-10-CM | POA: Diagnosis not present

## 2015-12-26 DIAGNOSIS — E1121 Type 2 diabetes mellitus with diabetic nephropathy: Secondary | ICD-10-CM | POA: Diagnosis not present

## 2015-12-26 DIAGNOSIS — N2581 Secondary hyperparathyroidism of renal origin: Secondary | ICD-10-CM | POA: Diagnosis not present

## 2015-12-26 DIAGNOSIS — Z23 Encounter for immunization: Secondary | ICD-10-CM | POA: Diagnosis not present

## 2015-12-26 DIAGNOSIS — N186 End stage renal disease: Secondary | ICD-10-CM | POA: Diagnosis not present

## 2015-12-27 DIAGNOSIS — Z992 Dependence on renal dialysis: Secondary | ICD-10-CM | POA: Diagnosis not present

## 2015-12-27 DIAGNOSIS — N186 End stage renal disease: Secondary | ICD-10-CM | POA: Diagnosis not present

## 2015-12-27 DIAGNOSIS — E1122 Type 2 diabetes mellitus with diabetic chronic kidney disease: Secondary | ICD-10-CM | POA: Diagnosis not present

## 2015-12-29 DIAGNOSIS — E1121 Type 2 diabetes mellitus with diabetic nephropathy: Secondary | ICD-10-CM | POA: Diagnosis not present

## 2015-12-29 DIAGNOSIS — N2581 Secondary hyperparathyroidism of renal origin: Secondary | ICD-10-CM | POA: Diagnosis not present

## 2015-12-29 DIAGNOSIS — Z23 Encounter for immunization: Secondary | ICD-10-CM | POA: Diagnosis not present

## 2015-12-29 DIAGNOSIS — N186 End stage renal disease: Secondary | ICD-10-CM | POA: Diagnosis not present

## 2015-12-30 ENCOUNTER — Encounter (HOSPITAL_BASED_OUTPATIENT_CLINIC_OR_DEPARTMENT_OTHER): Payer: Medicare Other | Attending: Internal Medicine

## 2015-12-30 DIAGNOSIS — L84 Corns and callosities: Secondary | ICD-10-CM | POA: Insufficient documentation

## 2015-12-30 DIAGNOSIS — Z992 Dependence on renal dialysis: Secondary | ICD-10-CM | POA: Diagnosis not present

## 2015-12-30 DIAGNOSIS — L97424 Non-pressure chronic ulcer of left heel and midfoot with necrosis of bone: Secondary | ICD-10-CM | POA: Diagnosis not present

## 2015-12-30 DIAGNOSIS — N185 Chronic kidney disease, stage 5: Secondary | ICD-10-CM | POA: Insufficient documentation

## 2015-12-30 DIAGNOSIS — L97529 Non-pressure chronic ulcer of other part of left foot with unspecified severity: Secondary | ICD-10-CM | POA: Diagnosis not present

## 2015-12-30 DIAGNOSIS — Z8631 Personal history of diabetic foot ulcer: Secondary | ICD-10-CM | POA: Insufficient documentation

## 2015-12-30 DIAGNOSIS — I132 Hypertensive heart and chronic kidney disease with heart failure and with stage 5 chronic kidney disease, or end stage renal disease: Secondary | ICD-10-CM | POA: Diagnosis not present

## 2015-12-30 DIAGNOSIS — L97519 Non-pressure chronic ulcer of other part of right foot with unspecified severity: Secondary | ICD-10-CM | POA: Insufficient documentation

## 2015-12-30 DIAGNOSIS — L97522 Non-pressure chronic ulcer of other part of left foot with fat layer exposed: Secondary | ICD-10-CM | POA: Insufficient documentation

## 2015-12-30 DIAGNOSIS — L97412 Non-pressure chronic ulcer of right heel and midfoot with fat layer exposed: Secondary | ICD-10-CM | POA: Diagnosis not present

## 2015-12-30 DIAGNOSIS — L97511 Non-pressure chronic ulcer of other part of right foot limited to breakdown of skin: Secondary | ICD-10-CM | POA: Diagnosis not present

## 2015-12-30 DIAGNOSIS — I509 Heart failure, unspecified: Secondary | ICD-10-CM | POA: Diagnosis not present

## 2015-12-30 DIAGNOSIS — E11621 Type 2 diabetes mellitus with foot ulcer: Secondary | ICD-10-CM | POA: Diagnosis not present

## 2015-12-30 DIAGNOSIS — L97422 Non-pressure chronic ulcer of left heel and midfoot with fat layer exposed: Secondary | ICD-10-CM | POA: Diagnosis not present

## 2015-12-31 DIAGNOSIS — N186 End stage renal disease: Secondary | ICD-10-CM | POA: Diagnosis not present

## 2015-12-31 DIAGNOSIS — Z23 Encounter for immunization: Secondary | ICD-10-CM | POA: Diagnosis not present

## 2015-12-31 DIAGNOSIS — N2581 Secondary hyperparathyroidism of renal origin: Secondary | ICD-10-CM | POA: Diagnosis not present

## 2015-12-31 DIAGNOSIS — E1121 Type 2 diabetes mellitus with diabetic nephropathy: Secondary | ICD-10-CM | POA: Diagnosis not present

## 2016-01-01 DIAGNOSIS — Z9889 Other specified postprocedural states: Secondary | ICD-10-CM | POA: Diagnosis not present

## 2016-01-02 DIAGNOSIS — N2581 Secondary hyperparathyroidism of renal origin: Secondary | ICD-10-CM | POA: Diagnosis not present

## 2016-01-02 DIAGNOSIS — E1121 Type 2 diabetes mellitus with diabetic nephropathy: Secondary | ICD-10-CM | POA: Diagnosis not present

## 2016-01-02 DIAGNOSIS — Z23 Encounter for immunization: Secondary | ICD-10-CM | POA: Diagnosis not present

## 2016-01-02 DIAGNOSIS — N186 End stage renal disease: Secondary | ICD-10-CM | POA: Diagnosis not present

## 2016-01-05 DIAGNOSIS — N186 End stage renal disease: Secondary | ICD-10-CM | POA: Diagnosis not present

## 2016-01-05 DIAGNOSIS — E1121 Type 2 diabetes mellitus with diabetic nephropathy: Secondary | ICD-10-CM | POA: Diagnosis not present

## 2016-01-05 DIAGNOSIS — Z23 Encounter for immunization: Secondary | ICD-10-CM | POA: Diagnosis not present

## 2016-01-05 DIAGNOSIS — N2581 Secondary hyperparathyroidism of renal origin: Secondary | ICD-10-CM | POA: Diagnosis not present

## 2016-01-06 DIAGNOSIS — L97522 Non-pressure chronic ulcer of other part of left foot with fat layer exposed: Secondary | ICD-10-CM | POA: Diagnosis not present

## 2016-01-06 DIAGNOSIS — L97422 Non-pressure chronic ulcer of left heel and midfoot with fat layer exposed: Secondary | ICD-10-CM | POA: Diagnosis not present

## 2016-01-06 DIAGNOSIS — I132 Hypertensive heart and chronic kidney disease with heart failure and with stage 5 chronic kidney disease, or end stage renal disease: Secondary | ICD-10-CM | POA: Diagnosis not present

## 2016-01-06 DIAGNOSIS — L97424 Non-pressure chronic ulcer of left heel and midfoot with necrosis of bone: Secondary | ICD-10-CM | POA: Diagnosis not present

## 2016-01-06 DIAGNOSIS — E11621 Type 2 diabetes mellitus with foot ulcer: Secondary | ICD-10-CM | POA: Diagnosis not present

## 2016-01-06 DIAGNOSIS — L97212 Non-pressure chronic ulcer of right calf with fat layer exposed: Secondary | ICD-10-CM | POA: Diagnosis not present

## 2016-01-06 DIAGNOSIS — L97511 Non-pressure chronic ulcer of other part of right foot limited to breakdown of skin: Secondary | ICD-10-CM | POA: Diagnosis not present

## 2016-01-06 DIAGNOSIS — L97421 Non-pressure chronic ulcer of left heel and midfoot limited to breakdown of skin: Secondary | ICD-10-CM | POA: Diagnosis not present

## 2016-01-06 DIAGNOSIS — L97529 Non-pressure chronic ulcer of other part of left foot with unspecified severity: Secondary | ICD-10-CM | POA: Diagnosis not present

## 2016-01-06 DIAGNOSIS — L97519 Non-pressure chronic ulcer of other part of right foot with unspecified severity: Secondary | ICD-10-CM | POA: Diagnosis not present

## 2016-01-07 DIAGNOSIS — N2581 Secondary hyperparathyroidism of renal origin: Secondary | ICD-10-CM | POA: Diagnosis not present

## 2016-01-07 DIAGNOSIS — E1121 Type 2 diabetes mellitus with diabetic nephropathy: Secondary | ICD-10-CM | POA: Diagnosis not present

## 2016-01-07 DIAGNOSIS — N186 End stage renal disease: Secondary | ICD-10-CM | POA: Diagnosis not present

## 2016-01-07 DIAGNOSIS — Z23 Encounter for immunization: Secondary | ICD-10-CM | POA: Diagnosis not present

## 2016-01-09 DIAGNOSIS — N186 End stage renal disease: Secondary | ICD-10-CM | POA: Diagnosis not present

## 2016-01-09 DIAGNOSIS — N2581 Secondary hyperparathyroidism of renal origin: Secondary | ICD-10-CM | POA: Diagnosis not present

## 2016-01-09 DIAGNOSIS — Z23 Encounter for immunization: Secondary | ICD-10-CM | POA: Diagnosis not present

## 2016-01-09 DIAGNOSIS — E1121 Type 2 diabetes mellitus with diabetic nephropathy: Secondary | ICD-10-CM | POA: Diagnosis not present

## 2016-01-12 DIAGNOSIS — N2581 Secondary hyperparathyroidism of renal origin: Secondary | ICD-10-CM | POA: Diagnosis not present

## 2016-01-12 DIAGNOSIS — Z23 Encounter for immunization: Secondary | ICD-10-CM | POA: Diagnosis not present

## 2016-01-12 DIAGNOSIS — N186 End stage renal disease: Secondary | ICD-10-CM | POA: Diagnosis not present

## 2016-01-12 DIAGNOSIS — E1121 Type 2 diabetes mellitus with diabetic nephropathy: Secondary | ICD-10-CM | POA: Diagnosis not present

## 2016-01-13 DIAGNOSIS — L97424 Non-pressure chronic ulcer of left heel and midfoot with necrosis of bone: Secondary | ICD-10-CM | POA: Diagnosis not present

## 2016-01-13 DIAGNOSIS — L97412 Non-pressure chronic ulcer of right heel and midfoot with fat layer exposed: Secondary | ICD-10-CM | POA: Diagnosis not present

## 2016-01-13 DIAGNOSIS — I132 Hypertensive heart and chronic kidney disease with heart failure and with stage 5 chronic kidney disease, or end stage renal disease: Secondary | ICD-10-CM | POA: Diagnosis not present

## 2016-01-13 DIAGNOSIS — L97529 Non-pressure chronic ulcer of other part of left foot with unspecified severity: Secondary | ICD-10-CM | POA: Diagnosis not present

## 2016-01-13 DIAGNOSIS — L97421 Non-pressure chronic ulcer of left heel and midfoot limited to breakdown of skin: Secondary | ICD-10-CM | POA: Diagnosis not present

## 2016-01-13 DIAGNOSIS — L97511 Non-pressure chronic ulcer of other part of right foot limited to breakdown of skin: Secondary | ICD-10-CM | POA: Diagnosis not present

## 2016-01-13 DIAGNOSIS — L97519 Non-pressure chronic ulcer of other part of right foot with unspecified severity: Secondary | ICD-10-CM | POA: Diagnosis not present

## 2016-01-13 DIAGNOSIS — E11621 Type 2 diabetes mellitus with foot ulcer: Secondary | ICD-10-CM | POA: Diagnosis not present

## 2016-01-13 DIAGNOSIS — L97522 Non-pressure chronic ulcer of other part of left foot with fat layer exposed: Secondary | ICD-10-CM | POA: Diagnosis not present

## 2016-01-14 DIAGNOSIS — E1121 Type 2 diabetes mellitus with diabetic nephropathy: Secondary | ICD-10-CM | POA: Diagnosis not present

## 2016-01-14 DIAGNOSIS — Z23 Encounter for immunization: Secondary | ICD-10-CM | POA: Diagnosis not present

## 2016-01-14 DIAGNOSIS — N186 End stage renal disease: Secondary | ICD-10-CM | POA: Diagnosis not present

## 2016-01-14 DIAGNOSIS — N2581 Secondary hyperparathyroidism of renal origin: Secondary | ICD-10-CM | POA: Diagnosis not present

## 2016-01-16 DIAGNOSIS — E1121 Type 2 diabetes mellitus with diabetic nephropathy: Secondary | ICD-10-CM | POA: Diagnosis not present

## 2016-01-16 DIAGNOSIS — Z23 Encounter for immunization: Secondary | ICD-10-CM | POA: Diagnosis not present

## 2016-01-16 DIAGNOSIS — N2581 Secondary hyperparathyroidism of renal origin: Secondary | ICD-10-CM | POA: Diagnosis not present

## 2016-01-16 DIAGNOSIS — N186 End stage renal disease: Secondary | ICD-10-CM | POA: Diagnosis not present

## 2016-01-19 DIAGNOSIS — Z23 Encounter for immunization: Secondary | ICD-10-CM | POA: Diagnosis not present

## 2016-01-19 DIAGNOSIS — N186 End stage renal disease: Secondary | ICD-10-CM | POA: Diagnosis not present

## 2016-01-19 DIAGNOSIS — E1121 Type 2 diabetes mellitus with diabetic nephropathy: Secondary | ICD-10-CM | POA: Diagnosis not present

## 2016-01-19 DIAGNOSIS — N2581 Secondary hyperparathyroidism of renal origin: Secondary | ICD-10-CM | POA: Diagnosis not present

## 2016-01-20 DIAGNOSIS — I132 Hypertensive heart and chronic kidney disease with heart failure and with stage 5 chronic kidney disease, or end stage renal disease: Secondary | ICD-10-CM | POA: Diagnosis not present

## 2016-01-20 DIAGNOSIS — E11621 Type 2 diabetes mellitus with foot ulcer: Secondary | ICD-10-CM | POA: Diagnosis not present

## 2016-01-20 DIAGNOSIS — L97529 Non-pressure chronic ulcer of other part of left foot with unspecified severity: Secondary | ICD-10-CM | POA: Diagnosis not present

## 2016-01-20 DIAGNOSIS — L97424 Non-pressure chronic ulcer of left heel and midfoot with necrosis of bone: Secondary | ICD-10-CM | POA: Diagnosis not present

## 2016-01-20 DIAGNOSIS — L97412 Non-pressure chronic ulcer of right heel and midfoot with fat layer exposed: Secondary | ICD-10-CM | POA: Diagnosis not present

## 2016-01-20 DIAGNOSIS — L97522 Non-pressure chronic ulcer of other part of left foot with fat layer exposed: Secondary | ICD-10-CM | POA: Diagnosis not present

## 2016-01-20 DIAGNOSIS — L97519 Non-pressure chronic ulcer of other part of right foot with unspecified severity: Secondary | ICD-10-CM | POA: Diagnosis not present

## 2016-01-20 DIAGNOSIS — L97511 Non-pressure chronic ulcer of other part of right foot limited to breakdown of skin: Secondary | ICD-10-CM | POA: Diagnosis not present

## 2016-01-20 DIAGNOSIS — L97421 Non-pressure chronic ulcer of left heel and midfoot limited to breakdown of skin: Secondary | ICD-10-CM | POA: Diagnosis not present

## 2016-01-21 DIAGNOSIS — N2581 Secondary hyperparathyroidism of renal origin: Secondary | ICD-10-CM | POA: Diagnosis not present

## 2016-01-21 DIAGNOSIS — E1121 Type 2 diabetes mellitus with diabetic nephropathy: Secondary | ICD-10-CM | POA: Diagnosis not present

## 2016-01-21 DIAGNOSIS — Z23 Encounter for immunization: Secondary | ICD-10-CM | POA: Diagnosis not present

## 2016-01-21 DIAGNOSIS — N186 End stage renal disease: Secondary | ICD-10-CM | POA: Diagnosis not present

## 2016-01-22 DIAGNOSIS — L97421 Non-pressure chronic ulcer of left heel and midfoot limited to breakdown of skin: Secondary | ICD-10-CM | POA: Diagnosis not present

## 2016-01-22 DIAGNOSIS — L97529 Non-pressure chronic ulcer of other part of left foot with unspecified severity: Secondary | ICD-10-CM | POA: Diagnosis not present

## 2016-01-22 DIAGNOSIS — L97522 Non-pressure chronic ulcer of other part of left foot with fat layer exposed: Secondary | ICD-10-CM | POA: Diagnosis not present

## 2016-01-22 DIAGNOSIS — L97411 Non-pressure chronic ulcer of right heel and midfoot limited to breakdown of skin: Secondary | ICD-10-CM | POA: Diagnosis not present

## 2016-01-22 DIAGNOSIS — I132 Hypertensive heart and chronic kidney disease with heart failure and with stage 5 chronic kidney disease, or end stage renal disease: Secondary | ICD-10-CM | POA: Diagnosis not present

## 2016-01-22 DIAGNOSIS — Z9889 Other specified postprocedural states: Secondary | ICD-10-CM | POA: Diagnosis not present

## 2016-01-22 DIAGNOSIS — E11621 Type 2 diabetes mellitus with foot ulcer: Secondary | ICD-10-CM | POA: Diagnosis not present

## 2016-01-22 DIAGNOSIS — L97511 Non-pressure chronic ulcer of other part of right foot limited to breakdown of skin: Secondary | ICD-10-CM | POA: Diagnosis not present

## 2016-01-22 DIAGNOSIS — L97519 Non-pressure chronic ulcer of other part of right foot with unspecified severity: Secondary | ICD-10-CM | POA: Diagnosis not present

## 2016-01-22 DIAGNOSIS — L97424 Non-pressure chronic ulcer of left heel and midfoot with necrosis of bone: Secondary | ICD-10-CM | POA: Diagnosis not present

## 2016-01-23 DIAGNOSIS — Z23 Encounter for immunization: Secondary | ICD-10-CM | POA: Diagnosis not present

## 2016-01-23 DIAGNOSIS — E1121 Type 2 diabetes mellitus with diabetic nephropathy: Secondary | ICD-10-CM | POA: Diagnosis not present

## 2016-01-23 DIAGNOSIS — N2581 Secondary hyperparathyroidism of renal origin: Secondary | ICD-10-CM | POA: Diagnosis not present

## 2016-01-23 DIAGNOSIS — N186 End stage renal disease: Secondary | ICD-10-CM | POA: Diagnosis not present

## 2016-01-26 DIAGNOSIS — E1121 Type 2 diabetes mellitus with diabetic nephropathy: Secondary | ICD-10-CM | POA: Diagnosis not present

## 2016-01-26 DIAGNOSIS — Z23 Encounter for immunization: Secondary | ICD-10-CM | POA: Diagnosis not present

## 2016-01-26 DIAGNOSIS — N186 End stage renal disease: Secondary | ICD-10-CM | POA: Diagnosis not present

## 2016-01-26 DIAGNOSIS — N2581 Secondary hyperparathyroidism of renal origin: Secondary | ICD-10-CM | POA: Diagnosis not present

## 2016-01-27 DIAGNOSIS — Z992 Dependence on renal dialysis: Secondary | ICD-10-CM | POA: Diagnosis not present

## 2016-01-27 DIAGNOSIS — L97411 Non-pressure chronic ulcer of right heel and midfoot limited to breakdown of skin: Secondary | ICD-10-CM | POA: Diagnosis not present

## 2016-01-27 DIAGNOSIS — S91331A Puncture wound without foreign body, right foot, initial encounter: Secondary | ICD-10-CM | POA: Diagnosis not present

## 2016-01-27 DIAGNOSIS — L97511 Non-pressure chronic ulcer of other part of right foot limited to breakdown of skin: Secondary | ICD-10-CM | POA: Diagnosis not present

## 2016-01-27 DIAGNOSIS — E1122 Type 2 diabetes mellitus with diabetic chronic kidney disease: Secondary | ICD-10-CM | POA: Diagnosis not present

## 2016-01-27 DIAGNOSIS — L97421 Non-pressure chronic ulcer of left heel and midfoot limited to breakdown of skin: Secondary | ICD-10-CM | POA: Diagnosis not present

## 2016-01-27 DIAGNOSIS — E11621 Type 2 diabetes mellitus with foot ulcer: Secondary | ICD-10-CM | POA: Diagnosis not present

## 2016-01-27 DIAGNOSIS — L97519 Non-pressure chronic ulcer of other part of right foot with unspecified severity: Secondary | ICD-10-CM | POA: Diagnosis not present

## 2016-01-27 DIAGNOSIS — I132 Hypertensive heart and chronic kidney disease with heart failure and with stage 5 chronic kidney disease, or end stage renal disease: Secondary | ICD-10-CM | POA: Diagnosis not present

## 2016-01-27 DIAGNOSIS — N186 End stage renal disease: Secondary | ICD-10-CM | POA: Diagnosis not present

## 2016-01-27 DIAGNOSIS — L97529 Non-pressure chronic ulcer of other part of left foot with unspecified severity: Secondary | ICD-10-CM | POA: Diagnosis not present

## 2016-01-27 DIAGNOSIS — L97422 Non-pressure chronic ulcer of left heel and midfoot with fat layer exposed: Secondary | ICD-10-CM | POA: Diagnosis not present

## 2016-01-27 DIAGNOSIS — L97522 Non-pressure chronic ulcer of other part of left foot with fat layer exposed: Secondary | ICD-10-CM | POA: Diagnosis not present

## 2016-01-28 DIAGNOSIS — N186 End stage renal disease: Secondary | ICD-10-CM | POA: Diagnosis not present

## 2016-01-28 DIAGNOSIS — E1121 Type 2 diabetes mellitus with diabetic nephropathy: Secondary | ICD-10-CM | POA: Diagnosis not present

## 2016-01-28 DIAGNOSIS — N2581 Secondary hyperparathyroidism of renal origin: Secondary | ICD-10-CM | POA: Diagnosis not present

## 2016-01-30 DIAGNOSIS — E1121 Type 2 diabetes mellitus with diabetic nephropathy: Secondary | ICD-10-CM | POA: Diagnosis not present

## 2016-01-30 DIAGNOSIS — N2581 Secondary hyperparathyroidism of renal origin: Secondary | ICD-10-CM | POA: Diagnosis not present

## 2016-01-30 DIAGNOSIS — N186 End stage renal disease: Secondary | ICD-10-CM | POA: Diagnosis not present

## 2016-02-02 DIAGNOSIS — E1121 Type 2 diabetes mellitus with diabetic nephropathy: Secondary | ICD-10-CM | POA: Diagnosis not present

## 2016-02-02 DIAGNOSIS — N2581 Secondary hyperparathyroidism of renal origin: Secondary | ICD-10-CM | POA: Diagnosis not present

## 2016-02-02 DIAGNOSIS — N186 End stage renal disease: Secondary | ICD-10-CM | POA: Diagnosis not present

## 2016-02-03 ENCOUNTER — Encounter (HOSPITAL_BASED_OUTPATIENT_CLINIC_OR_DEPARTMENT_OTHER): Payer: Medicare Other | Attending: Internal Medicine

## 2016-02-03 DIAGNOSIS — Z992 Dependence on renal dialysis: Secondary | ICD-10-CM | POA: Insufficient documentation

## 2016-02-03 DIAGNOSIS — I132 Hypertensive heart and chronic kidney disease with heart failure and with stage 5 chronic kidney disease, or end stage renal disease: Secondary | ICD-10-CM | POA: Diagnosis not present

## 2016-02-03 DIAGNOSIS — E1140 Type 2 diabetes mellitus with diabetic neuropathy, unspecified: Secondary | ICD-10-CM | POA: Insufficient documentation

## 2016-02-03 DIAGNOSIS — L97411 Non-pressure chronic ulcer of right heel and midfoot limited to breakdown of skin: Secondary | ICD-10-CM | POA: Insufficient documentation

## 2016-02-03 DIAGNOSIS — E1122 Type 2 diabetes mellitus with diabetic chronic kidney disease: Secondary | ICD-10-CM | POA: Insufficient documentation

## 2016-02-03 DIAGNOSIS — N186 End stage renal disease: Secondary | ICD-10-CM | POA: Insufficient documentation

## 2016-02-03 DIAGNOSIS — Z89421 Acquired absence of other right toe(s): Secondary | ICD-10-CM | POA: Diagnosis not present

## 2016-02-03 DIAGNOSIS — M1990 Unspecified osteoarthritis, unspecified site: Secondary | ICD-10-CM | POA: Insufficient documentation

## 2016-02-03 DIAGNOSIS — E11621 Type 2 diabetes mellitus with foot ulcer: Secondary | ICD-10-CM | POA: Diagnosis present

## 2016-02-03 DIAGNOSIS — I509 Heart failure, unspecified: Secondary | ICD-10-CM | POA: Diagnosis not present

## 2016-02-03 DIAGNOSIS — L97514 Non-pressure chronic ulcer of other part of right foot with necrosis of bone: Secondary | ICD-10-CM | POA: Diagnosis not present

## 2016-02-03 DIAGNOSIS — L97511 Non-pressure chronic ulcer of other part of right foot limited to breakdown of skin: Secondary | ICD-10-CM | POA: Diagnosis not present

## 2016-02-03 DIAGNOSIS — L97412 Non-pressure chronic ulcer of right heel and midfoot with fat layer exposed: Secondary | ICD-10-CM | POA: Diagnosis not present

## 2016-02-03 DIAGNOSIS — Z8631 Personal history of diabetic foot ulcer: Secondary | ICD-10-CM | POA: Diagnosis not present

## 2016-02-03 DIAGNOSIS — E1151 Type 2 diabetes mellitus with diabetic peripheral angiopathy without gangrene: Secondary | ICD-10-CM | POA: Insufficient documentation

## 2016-02-04 DIAGNOSIS — N186 End stage renal disease: Secondary | ICD-10-CM | POA: Diagnosis not present

## 2016-02-04 DIAGNOSIS — N2581 Secondary hyperparathyroidism of renal origin: Secondary | ICD-10-CM | POA: Diagnosis not present

## 2016-02-04 DIAGNOSIS — E1121 Type 2 diabetes mellitus with diabetic nephropathy: Secondary | ICD-10-CM | POA: Diagnosis not present

## 2016-02-06 DIAGNOSIS — N2581 Secondary hyperparathyroidism of renal origin: Secondary | ICD-10-CM | POA: Diagnosis not present

## 2016-02-06 DIAGNOSIS — N186 End stage renal disease: Secondary | ICD-10-CM | POA: Diagnosis not present

## 2016-02-06 DIAGNOSIS — E1121 Type 2 diabetes mellitus with diabetic nephropathy: Secondary | ICD-10-CM | POA: Diagnosis not present

## 2016-02-09 DIAGNOSIS — N186 End stage renal disease: Secondary | ICD-10-CM | POA: Diagnosis not present

## 2016-02-09 DIAGNOSIS — N2581 Secondary hyperparathyroidism of renal origin: Secondary | ICD-10-CM | POA: Diagnosis not present

## 2016-02-09 DIAGNOSIS — E1121 Type 2 diabetes mellitus with diabetic nephropathy: Secondary | ICD-10-CM | POA: Diagnosis not present

## 2016-02-10 DIAGNOSIS — L97511 Non-pressure chronic ulcer of other part of right foot limited to breakdown of skin: Secondary | ICD-10-CM | POA: Diagnosis not present

## 2016-02-10 DIAGNOSIS — L97411 Non-pressure chronic ulcer of right heel and midfoot limited to breakdown of skin: Secondary | ICD-10-CM | POA: Diagnosis not present

## 2016-02-10 DIAGNOSIS — Z992 Dependence on renal dialysis: Secondary | ICD-10-CM | POA: Diagnosis not present

## 2016-02-10 DIAGNOSIS — N186 End stage renal disease: Secondary | ICD-10-CM | POA: Diagnosis not present

## 2016-02-10 DIAGNOSIS — L97422 Non-pressure chronic ulcer of left heel and midfoot with fat layer exposed: Secondary | ICD-10-CM | POA: Diagnosis not present

## 2016-02-10 DIAGNOSIS — L97514 Non-pressure chronic ulcer of other part of right foot with necrosis of bone: Secondary | ICD-10-CM | POA: Diagnosis not present

## 2016-02-10 DIAGNOSIS — S91331A Puncture wound without foreign body, right foot, initial encounter: Secondary | ICD-10-CM | POA: Diagnosis not present

## 2016-02-10 DIAGNOSIS — E1122 Type 2 diabetes mellitus with diabetic chronic kidney disease: Secondary | ICD-10-CM | POA: Diagnosis not present

## 2016-02-10 DIAGNOSIS — E11621 Type 2 diabetes mellitus with foot ulcer: Secondary | ICD-10-CM | POA: Diagnosis not present

## 2016-02-10 DIAGNOSIS — L97412 Non-pressure chronic ulcer of right heel and midfoot with fat layer exposed: Secondary | ICD-10-CM | POA: Diagnosis not present

## 2016-02-11 DIAGNOSIS — E1121 Type 2 diabetes mellitus with diabetic nephropathy: Secondary | ICD-10-CM | POA: Diagnosis not present

## 2016-02-11 DIAGNOSIS — N186 End stage renal disease: Secondary | ICD-10-CM | POA: Diagnosis not present

## 2016-02-11 DIAGNOSIS — N2581 Secondary hyperparathyroidism of renal origin: Secondary | ICD-10-CM | POA: Diagnosis not present

## 2016-02-13 DIAGNOSIS — N186 End stage renal disease: Secondary | ICD-10-CM | POA: Diagnosis not present

## 2016-02-13 DIAGNOSIS — N2581 Secondary hyperparathyroidism of renal origin: Secondary | ICD-10-CM | POA: Diagnosis not present

## 2016-02-13 DIAGNOSIS — E1121 Type 2 diabetes mellitus with diabetic nephropathy: Secondary | ICD-10-CM | POA: Diagnosis not present

## 2016-02-16 DIAGNOSIS — E1121 Type 2 diabetes mellitus with diabetic nephropathy: Secondary | ICD-10-CM | POA: Diagnosis not present

## 2016-02-16 DIAGNOSIS — N2581 Secondary hyperparathyroidism of renal origin: Secondary | ICD-10-CM | POA: Diagnosis not present

## 2016-02-16 DIAGNOSIS — N186 End stage renal disease: Secondary | ICD-10-CM | POA: Diagnosis not present

## 2016-02-17 DIAGNOSIS — L97422 Non-pressure chronic ulcer of left heel and midfoot with fat layer exposed: Secondary | ICD-10-CM | POA: Diagnosis not present

## 2016-02-17 DIAGNOSIS — L97511 Non-pressure chronic ulcer of other part of right foot limited to breakdown of skin: Secondary | ICD-10-CM | POA: Diagnosis not present

## 2016-02-17 DIAGNOSIS — E11621 Type 2 diabetes mellitus with foot ulcer: Secondary | ICD-10-CM | POA: Diagnosis not present

## 2016-02-17 DIAGNOSIS — L97411 Non-pressure chronic ulcer of right heel and midfoot limited to breakdown of skin: Secondary | ICD-10-CM | POA: Diagnosis not present

## 2016-02-17 DIAGNOSIS — Z992 Dependence on renal dialysis: Secondary | ICD-10-CM | POA: Diagnosis not present

## 2016-02-17 DIAGNOSIS — S91331A Puncture wound without foreign body, right foot, initial encounter: Secondary | ICD-10-CM | POA: Diagnosis not present

## 2016-02-17 DIAGNOSIS — L97412 Non-pressure chronic ulcer of right heel and midfoot with fat layer exposed: Secondary | ICD-10-CM | POA: Diagnosis not present

## 2016-02-17 DIAGNOSIS — N186 End stage renal disease: Secondary | ICD-10-CM | POA: Diagnosis not present

## 2016-02-17 DIAGNOSIS — L97514 Non-pressure chronic ulcer of other part of right foot with necrosis of bone: Secondary | ICD-10-CM | POA: Diagnosis not present

## 2016-02-17 DIAGNOSIS — E1122 Type 2 diabetes mellitus with diabetic chronic kidney disease: Secondary | ICD-10-CM | POA: Diagnosis not present

## 2016-02-18 DIAGNOSIS — N2581 Secondary hyperparathyroidism of renal origin: Secondary | ICD-10-CM | POA: Diagnosis not present

## 2016-02-18 DIAGNOSIS — E1121 Type 2 diabetes mellitus with diabetic nephropathy: Secondary | ICD-10-CM | POA: Diagnosis not present

## 2016-02-18 DIAGNOSIS — N186 End stage renal disease: Secondary | ICD-10-CM | POA: Diagnosis not present

## 2016-02-20 DIAGNOSIS — N186 End stage renal disease: Secondary | ICD-10-CM | POA: Diagnosis not present

## 2016-02-20 DIAGNOSIS — E1121 Type 2 diabetes mellitus with diabetic nephropathy: Secondary | ICD-10-CM | POA: Diagnosis not present

## 2016-02-20 DIAGNOSIS — N2581 Secondary hyperparathyroidism of renal origin: Secondary | ICD-10-CM | POA: Diagnosis not present

## 2016-02-23 DIAGNOSIS — E1121 Type 2 diabetes mellitus with diabetic nephropathy: Secondary | ICD-10-CM | POA: Diagnosis not present

## 2016-02-23 DIAGNOSIS — N186 End stage renal disease: Secondary | ICD-10-CM | POA: Diagnosis not present

## 2016-02-23 DIAGNOSIS — N2581 Secondary hyperparathyroidism of renal origin: Secondary | ICD-10-CM | POA: Diagnosis not present

## 2016-02-24 DIAGNOSIS — Z992 Dependence on renal dialysis: Secondary | ICD-10-CM | POA: Diagnosis not present

## 2016-02-24 DIAGNOSIS — E1122 Type 2 diabetes mellitus with diabetic chronic kidney disease: Secondary | ICD-10-CM | POA: Diagnosis not present

## 2016-02-24 DIAGNOSIS — L97412 Non-pressure chronic ulcer of right heel and midfoot with fat layer exposed: Secondary | ICD-10-CM | POA: Diagnosis not present

## 2016-02-24 DIAGNOSIS — L97511 Non-pressure chronic ulcer of other part of right foot limited to breakdown of skin: Secondary | ICD-10-CM | POA: Diagnosis not present

## 2016-02-24 DIAGNOSIS — L97411 Non-pressure chronic ulcer of right heel and midfoot limited to breakdown of skin: Secondary | ICD-10-CM | POA: Diagnosis not present

## 2016-02-24 DIAGNOSIS — N186 End stage renal disease: Secondary | ICD-10-CM | POA: Diagnosis not present

## 2016-02-24 DIAGNOSIS — E11621 Type 2 diabetes mellitus with foot ulcer: Secondary | ICD-10-CM | POA: Diagnosis not present

## 2016-02-24 DIAGNOSIS — L97514 Non-pressure chronic ulcer of other part of right foot with necrosis of bone: Secondary | ICD-10-CM | POA: Diagnosis not present

## 2016-02-24 DIAGNOSIS — L97422 Non-pressure chronic ulcer of left heel and midfoot with fat layer exposed: Secondary | ICD-10-CM | POA: Diagnosis not present

## 2016-02-25 DIAGNOSIS — E1121 Type 2 diabetes mellitus with diabetic nephropathy: Secondary | ICD-10-CM | POA: Diagnosis not present

## 2016-02-25 DIAGNOSIS — S91309A Unspecified open wound, unspecified foot, initial encounter: Secondary | ICD-10-CM | POA: Diagnosis not present

## 2016-02-25 DIAGNOSIS — N2581 Secondary hyperparathyroidism of renal origin: Secondary | ICD-10-CM | POA: Diagnosis not present

## 2016-02-25 DIAGNOSIS — N186 End stage renal disease: Secondary | ICD-10-CM | POA: Diagnosis not present

## 2016-02-27 DIAGNOSIS — S91309A Unspecified open wound, unspecified foot, initial encounter: Secondary | ICD-10-CM | POA: Diagnosis not present

## 2016-02-27 DIAGNOSIS — N186 End stage renal disease: Secondary | ICD-10-CM | POA: Diagnosis not present

## 2016-02-27 DIAGNOSIS — E1121 Type 2 diabetes mellitus with diabetic nephropathy: Secondary | ICD-10-CM | POA: Diagnosis not present

## 2016-02-27 DIAGNOSIS — N2581 Secondary hyperparathyroidism of renal origin: Secondary | ICD-10-CM | POA: Diagnosis not present

## 2016-03-01 DIAGNOSIS — S91309A Unspecified open wound, unspecified foot, initial encounter: Secondary | ICD-10-CM | POA: Diagnosis not present

## 2016-03-01 DIAGNOSIS — E1121 Type 2 diabetes mellitus with diabetic nephropathy: Secondary | ICD-10-CM | POA: Diagnosis not present

## 2016-03-01 DIAGNOSIS — N186 End stage renal disease: Secondary | ICD-10-CM | POA: Diagnosis not present

## 2016-03-01 DIAGNOSIS — N2581 Secondary hyperparathyroidism of renal origin: Secondary | ICD-10-CM | POA: Diagnosis not present

## 2016-03-02 ENCOUNTER — Encounter (HOSPITAL_BASED_OUTPATIENT_CLINIC_OR_DEPARTMENT_OTHER): Payer: Medicare Other | Attending: Surgery

## 2016-03-02 DIAGNOSIS — E11621 Type 2 diabetes mellitus with foot ulcer: Secondary | ICD-10-CM | POA: Diagnosis not present

## 2016-03-02 DIAGNOSIS — I509 Heart failure, unspecified: Secondary | ICD-10-CM | POA: Diagnosis not present

## 2016-03-02 DIAGNOSIS — L97411 Non-pressure chronic ulcer of right heel and midfoot limited to breakdown of skin: Secondary | ICD-10-CM | POA: Diagnosis not present

## 2016-03-02 DIAGNOSIS — E1122 Type 2 diabetes mellitus with diabetic chronic kidney disease: Secondary | ICD-10-CM | POA: Insufficient documentation

## 2016-03-02 DIAGNOSIS — E1140 Type 2 diabetes mellitus with diabetic neuropathy, unspecified: Secondary | ICD-10-CM | POA: Insufficient documentation

## 2016-03-02 DIAGNOSIS — L97422 Non-pressure chronic ulcer of left heel and midfoot with fat layer exposed: Secondary | ICD-10-CM | POA: Insufficient documentation

## 2016-03-02 DIAGNOSIS — L97811 Non-pressure chronic ulcer of other part of right lower leg limited to breakdown of skin: Secondary | ICD-10-CM | POA: Diagnosis not present

## 2016-03-02 DIAGNOSIS — L97514 Non-pressure chronic ulcer of other part of right foot with necrosis of bone: Secondary | ICD-10-CM | POA: Diagnosis not present

## 2016-03-02 DIAGNOSIS — Z8631 Personal history of diabetic foot ulcer: Secondary | ICD-10-CM | POA: Insufficient documentation

## 2016-03-02 DIAGNOSIS — N186 End stage renal disease: Secondary | ICD-10-CM | POA: Diagnosis not present

## 2016-03-02 DIAGNOSIS — S91331A Puncture wound without foreign body, right foot, initial encounter: Secondary | ICD-10-CM | POA: Diagnosis not present

## 2016-03-02 DIAGNOSIS — I130 Hypertensive heart and chronic kidney disease with heart failure and stage 1 through stage 4 chronic kidney disease, or unspecified chronic kidney disease: Secondary | ICD-10-CM | POA: Insufficient documentation

## 2016-03-02 DIAGNOSIS — Z992 Dependence on renal dialysis: Secondary | ICD-10-CM | POA: Insufficient documentation

## 2016-03-02 DIAGNOSIS — M1990 Unspecified osteoarthritis, unspecified site: Secondary | ICD-10-CM | POA: Diagnosis not present

## 2016-03-02 DIAGNOSIS — E1136 Type 2 diabetes mellitus with diabetic cataract: Secondary | ICD-10-CM | POA: Diagnosis not present

## 2016-03-02 DIAGNOSIS — L97412 Non-pressure chronic ulcer of right heel and midfoot with fat layer exposed: Secondary | ICD-10-CM | POA: Diagnosis not present

## 2016-03-03 DIAGNOSIS — N186 End stage renal disease: Secondary | ICD-10-CM | POA: Diagnosis not present

## 2016-03-03 DIAGNOSIS — S91309A Unspecified open wound, unspecified foot, initial encounter: Secondary | ICD-10-CM | POA: Diagnosis not present

## 2016-03-03 DIAGNOSIS — N2581 Secondary hyperparathyroidism of renal origin: Secondary | ICD-10-CM | POA: Diagnosis not present

## 2016-03-03 DIAGNOSIS — E1121 Type 2 diabetes mellitus with diabetic nephropathy: Secondary | ICD-10-CM | POA: Diagnosis not present

## 2016-03-05 DIAGNOSIS — N2581 Secondary hyperparathyroidism of renal origin: Secondary | ICD-10-CM | POA: Diagnosis not present

## 2016-03-05 DIAGNOSIS — N186 End stage renal disease: Secondary | ICD-10-CM | POA: Diagnosis not present

## 2016-03-05 DIAGNOSIS — S91309A Unspecified open wound, unspecified foot, initial encounter: Secondary | ICD-10-CM | POA: Diagnosis not present

## 2016-03-05 DIAGNOSIS — E1121 Type 2 diabetes mellitus with diabetic nephropathy: Secondary | ICD-10-CM | POA: Diagnosis not present

## 2016-03-08 DIAGNOSIS — N186 End stage renal disease: Secondary | ICD-10-CM | POA: Diagnosis not present

## 2016-03-08 DIAGNOSIS — N2581 Secondary hyperparathyroidism of renal origin: Secondary | ICD-10-CM | POA: Diagnosis not present

## 2016-03-08 DIAGNOSIS — S91309A Unspecified open wound, unspecified foot, initial encounter: Secondary | ICD-10-CM | POA: Diagnosis not present

## 2016-03-08 DIAGNOSIS — E1121 Type 2 diabetes mellitus with diabetic nephropathy: Secondary | ICD-10-CM | POA: Diagnosis not present

## 2016-03-09 DIAGNOSIS — L97421 Non-pressure chronic ulcer of left heel and midfoot limited to breakdown of skin: Secondary | ICD-10-CM | POA: Diagnosis not present

## 2016-03-09 DIAGNOSIS — Z8631 Personal history of diabetic foot ulcer: Secondary | ICD-10-CM | POA: Diagnosis not present

## 2016-03-09 DIAGNOSIS — E11621 Type 2 diabetes mellitus with foot ulcer: Secondary | ICD-10-CM | POA: Diagnosis not present

## 2016-03-09 DIAGNOSIS — L97811 Non-pressure chronic ulcer of other part of right lower leg limited to breakdown of skin: Secondary | ICD-10-CM | POA: Diagnosis not present

## 2016-03-09 DIAGNOSIS — L97422 Non-pressure chronic ulcer of left heel and midfoot with fat layer exposed: Secondary | ICD-10-CM | POA: Diagnosis not present

## 2016-03-09 DIAGNOSIS — Z9889 Other specified postprocedural states: Secondary | ICD-10-CM | POA: Diagnosis not present

## 2016-03-09 DIAGNOSIS — L97411 Non-pressure chronic ulcer of right heel and midfoot limited to breakdown of skin: Secondary | ICD-10-CM | POA: Diagnosis not present

## 2016-03-09 DIAGNOSIS — E1122 Type 2 diabetes mellitus with diabetic chronic kidney disease: Secondary | ICD-10-CM | POA: Diagnosis not present

## 2016-03-09 DIAGNOSIS — Z992 Dependence on renal dialysis: Secondary | ICD-10-CM | POA: Diagnosis not present

## 2016-03-10 DIAGNOSIS — N2581 Secondary hyperparathyroidism of renal origin: Secondary | ICD-10-CM | POA: Diagnosis not present

## 2016-03-10 DIAGNOSIS — S91309A Unspecified open wound, unspecified foot, initial encounter: Secondary | ICD-10-CM | POA: Diagnosis not present

## 2016-03-10 DIAGNOSIS — N186 End stage renal disease: Secondary | ICD-10-CM | POA: Diagnosis not present

## 2016-03-10 DIAGNOSIS — E1121 Type 2 diabetes mellitus with diabetic nephropathy: Secondary | ICD-10-CM | POA: Diagnosis not present

## 2016-03-12 DIAGNOSIS — E1121 Type 2 diabetes mellitus with diabetic nephropathy: Secondary | ICD-10-CM | POA: Diagnosis not present

## 2016-03-12 DIAGNOSIS — S91309A Unspecified open wound, unspecified foot, initial encounter: Secondary | ICD-10-CM | POA: Diagnosis not present

## 2016-03-12 DIAGNOSIS — N186 End stage renal disease: Secondary | ICD-10-CM | POA: Diagnosis not present

## 2016-03-12 DIAGNOSIS — N2581 Secondary hyperparathyroidism of renal origin: Secondary | ICD-10-CM | POA: Diagnosis not present

## 2016-03-15 DIAGNOSIS — E1121 Type 2 diabetes mellitus with diabetic nephropathy: Secondary | ICD-10-CM | POA: Diagnosis not present

## 2016-03-15 DIAGNOSIS — S91309A Unspecified open wound, unspecified foot, initial encounter: Secondary | ICD-10-CM | POA: Diagnosis not present

## 2016-03-15 DIAGNOSIS — N2581 Secondary hyperparathyroidism of renal origin: Secondary | ICD-10-CM | POA: Diagnosis not present

## 2016-03-15 DIAGNOSIS — N186 End stage renal disease: Secondary | ICD-10-CM | POA: Diagnosis not present

## 2016-03-16 DIAGNOSIS — L97422 Non-pressure chronic ulcer of left heel and midfoot with fat layer exposed: Secondary | ICD-10-CM | POA: Diagnosis not present

## 2016-03-16 DIAGNOSIS — L97811 Non-pressure chronic ulcer of other part of right lower leg limited to breakdown of skin: Secondary | ICD-10-CM | POA: Diagnosis not present

## 2016-03-16 DIAGNOSIS — Z8631 Personal history of diabetic foot ulcer: Secondary | ICD-10-CM | POA: Diagnosis not present

## 2016-03-16 DIAGNOSIS — E11621 Type 2 diabetes mellitus with foot ulcer: Secondary | ICD-10-CM | POA: Diagnosis not present

## 2016-03-16 DIAGNOSIS — L97411 Non-pressure chronic ulcer of right heel and midfoot limited to breakdown of skin: Secondary | ICD-10-CM | POA: Diagnosis not present

## 2016-03-16 DIAGNOSIS — L97514 Non-pressure chronic ulcer of other part of right foot with necrosis of bone: Secondary | ICD-10-CM | POA: Diagnosis not present

## 2016-03-16 DIAGNOSIS — E1122 Type 2 diabetes mellitus with diabetic chronic kidney disease: Secondary | ICD-10-CM | POA: Diagnosis not present

## 2016-03-16 DIAGNOSIS — L97421 Non-pressure chronic ulcer of left heel and midfoot limited to breakdown of skin: Secondary | ICD-10-CM | POA: Diagnosis not present

## 2016-03-17 DIAGNOSIS — N186 End stage renal disease: Secondary | ICD-10-CM | POA: Diagnosis not present

## 2016-03-17 DIAGNOSIS — S91309A Unspecified open wound, unspecified foot, initial encounter: Secondary | ICD-10-CM | POA: Diagnosis not present

## 2016-03-17 DIAGNOSIS — E1121 Type 2 diabetes mellitus with diabetic nephropathy: Secondary | ICD-10-CM | POA: Diagnosis not present

## 2016-03-17 DIAGNOSIS — N2581 Secondary hyperparathyroidism of renal origin: Secondary | ICD-10-CM | POA: Diagnosis not present

## 2016-03-19 DIAGNOSIS — E1121 Type 2 diabetes mellitus with diabetic nephropathy: Secondary | ICD-10-CM | POA: Diagnosis not present

## 2016-03-19 DIAGNOSIS — N186 End stage renal disease: Secondary | ICD-10-CM | POA: Diagnosis not present

## 2016-03-19 DIAGNOSIS — S91309A Unspecified open wound, unspecified foot, initial encounter: Secondary | ICD-10-CM | POA: Diagnosis not present

## 2016-03-19 DIAGNOSIS — N2581 Secondary hyperparathyroidism of renal origin: Secondary | ICD-10-CM | POA: Diagnosis not present

## 2016-03-22 DIAGNOSIS — N2581 Secondary hyperparathyroidism of renal origin: Secondary | ICD-10-CM | POA: Diagnosis not present

## 2016-03-22 DIAGNOSIS — N186 End stage renal disease: Secondary | ICD-10-CM | POA: Diagnosis not present

## 2016-03-22 DIAGNOSIS — S91309A Unspecified open wound, unspecified foot, initial encounter: Secondary | ICD-10-CM | POA: Diagnosis not present

## 2016-03-22 DIAGNOSIS — E1121 Type 2 diabetes mellitus with diabetic nephropathy: Secondary | ICD-10-CM | POA: Diagnosis not present

## 2016-03-23 DIAGNOSIS — L97422 Non-pressure chronic ulcer of left heel and midfoot with fat layer exposed: Secondary | ICD-10-CM | POA: Diagnosis not present

## 2016-03-23 DIAGNOSIS — E1122 Type 2 diabetes mellitus with diabetic chronic kidney disease: Secondary | ICD-10-CM | POA: Diagnosis not present

## 2016-03-23 DIAGNOSIS — L97522 Non-pressure chronic ulcer of other part of left foot with fat layer exposed: Secondary | ICD-10-CM | POA: Diagnosis not present

## 2016-03-23 DIAGNOSIS — N185 Chronic kidney disease, stage 5: Secondary | ICD-10-CM | POA: Diagnosis not present

## 2016-03-23 DIAGNOSIS — E11621 Type 2 diabetes mellitus with foot ulcer: Secondary | ICD-10-CM | POA: Diagnosis not present

## 2016-03-23 DIAGNOSIS — L97512 Non-pressure chronic ulcer of other part of right foot with fat layer exposed: Secondary | ICD-10-CM | POA: Diagnosis not present

## 2016-03-23 DIAGNOSIS — L97411 Non-pressure chronic ulcer of right heel and midfoot limited to breakdown of skin: Secondary | ICD-10-CM | POA: Diagnosis not present

## 2016-03-23 DIAGNOSIS — Z8631 Personal history of diabetic foot ulcer: Secondary | ICD-10-CM | POA: Diagnosis not present

## 2016-03-23 DIAGNOSIS — L97811 Non-pressure chronic ulcer of other part of right lower leg limited to breakdown of skin: Secondary | ICD-10-CM | POA: Diagnosis not present

## 2016-03-23 DIAGNOSIS — L97511 Non-pressure chronic ulcer of other part of right foot limited to breakdown of skin: Secondary | ICD-10-CM | POA: Diagnosis not present

## 2016-03-24 DIAGNOSIS — N2581 Secondary hyperparathyroidism of renal origin: Secondary | ICD-10-CM | POA: Diagnosis not present

## 2016-03-24 DIAGNOSIS — S91309A Unspecified open wound, unspecified foot, initial encounter: Secondary | ICD-10-CM | POA: Diagnosis not present

## 2016-03-24 DIAGNOSIS — E1121 Type 2 diabetes mellitus with diabetic nephropathy: Secondary | ICD-10-CM | POA: Diagnosis not present

## 2016-03-24 DIAGNOSIS — N186 End stage renal disease: Secondary | ICD-10-CM | POA: Diagnosis not present

## 2016-03-25 ENCOUNTER — Ambulatory Visit (HOSPITAL_COMMUNITY)
Admission: RE | Admit: 2016-03-25 | Discharge: 2016-03-25 | Disposition: A | Discharge status: Home / Self Care | Payer: Medicare Other | Source: Ambulatory Visit | Attending: Surgery | Admitting: Surgery

## 2016-03-25 ENCOUNTER — Other Ambulatory Visit: Payer: Self-pay | Admitting: Surgery

## 2016-03-25 DIAGNOSIS — M7989 Other specified soft tissue disorders: Secondary | ICD-10-CM | POA: Diagnosis not present

## 2016-03-25 DIAGNOSIS — E11621 Type 2 diabetes mellitus with foot ulcer: Secondary | ICD-10-CM | POA: Diagnosis not present

## 2016-03-25 DIAGNOSIS — M869 Osteomyelitis, unspecified: Secondary | ICD-10-CM

## 2016-03-25 DIAGNOSIS — I739 Peripheral vascular disease, unspecified: Secondary | ICD-10-CM | POA: Diagnosis not present

## 2016-03-25 DIAGNOSIS — Z89421 Acquired absence of other right toe(s): Secondary | ICD-10-CM | POA: Diagnosis not present

## 2016-03-25 DIAGNOSIS — L97419 Non-pressure chronic ulcer of right heel and midfoot with unspecified severity: Secondary | ICD-10-CM | POA: Diagnosis not present

## 2016-03-25 DIAGNOSIS — L97519 Non-pressure chronic ulcer of other part of right foot with unspecified severity: Secondary | ICD-10-CM | POA: Diagnosis not present

## 2016-03-25 DIAGNOSIS — L97529 Non-pressure chronic ulcer of other part of left foot with unspecified severity: Secondary | ICD-10-CM | POA: Diagnosis not present

## 2016-03-26 DIAGNOSIS — E1121 Type 2 diabetes mellitus with diabetic nephropathy: Secondary | ICD-10-CM | POA: Diagnosis not present

## 2016-03-26 DIAGNOSIS — Z992 Dependence on renal dialysis: Secondary | ICD-10-CM | POA: Diagnosis not present

## 2016-03-26 DIAGNOSIS — E1122 Type 2 diabetes mellitus with diabetic chronic kidney disease: Secondary | ICD-10-CM | POA: Diagnosis not present

## 2016-03-26 DIAGNOSIS — N2581 Secondary hyperparathyroidism of renal origin: Secondary | ICD-10-CM | POA: Diagnosis not present

## 2016-03-26 DIAGNOSIS — N186 End stage renal disease: Secondary | ICD-10-CM | POA: Diagnosis not present

## 2016-03-26 DIAGNOSIS — S91309A Unspecified open wound, unspecified foot, initial encounter: Secondary | ICD-10-CM | POA: Diagnosis not present

## 2016-03-29 DIAGNOSIS — N2581 Secondary hyperparathyroidism of renal origin: Secondary | ICD-10-CM | POA: Diagnosis not present

## 2016-03-29 DIAGNOSIS — E1121 Type 2 diabetes mellitus with diabetic nephropathy: Secondary | ICD-10-CM | POA: Diagnosis not present

## 2016-03-29 DIAGNOSIS — S91309A Unspecified open wound, unspecified foot, initial encounter: Secondary | ICD-10-CM | POA: Diagnosis not present

## 2016-03-29 DIAGNOSIS — N186 End stage renal disease: Secondary | ICD-10-CM | POA: Diagnosis not present

## 2016-03-30 ENCOUNTER — Encounter (HOSPITAL_BASED_OUTPATIENT_CLINIC_OR_DEPARTMENT_OTHER): Payer: Medicare Other | Attending: Surgery

## 2016-03-30 DIAGNOSIS — L97522 Non-pressure chronic ulcer of other part of left foot with fat layer exposed: Secondary | ICD-10-CM | POA: Diagnosis not present

## 2016-03-30 DIAGNOSIS — E1140 Type 2 diabetes mellitus with diabetic neuropathy, unspecified: Secondary | ICD-10-CM | POA: Insufficient documentation

## 2016-03-30 DIAGNOSIS — E11621 Type 2 diabetes mellitus with foot ulcer: Secondary | ICD-10-CM | POA: Insufficient documentation

## 2016-03-30 DIAGNOSIS — I120 Hypertensive chronic kidney disease with stage 5 chronic kidney disease or end stage renal disease: Secondary | ICD-10-CM | POA: Diagnosis not present

## 2016-03-30 DIAGNOSIS — E1151 Type 2 diabetes mellitus with diabetic peripheral angiopathy without gangrene: Secondary | ICD-10-CM | POA: Insufficient documentation

## 2016-03-30 DIAGNOSIS — N186 End stage renal disease: Secondary | ICD-10-CM | POA: Insufficient documentation

## 2016-03-30 DIAGNOSIS — Z89422 Acquired absence of other left toe(s): Secondary | ICD-10-CM | POA: Diagnosis not present

## 2016-03-30 DIAGNOSIS — Z992 Dependence on renal dialysis: Secondary | ICD-10-CM | POA: Diagnosis not present

## 2016-03-30 DIAGNOSIS — A4901 Methicillin susceptible Staphylococcus aureus infection, unspecified site: Secondary | ICD-10-CM | POA: Insufficient documentation

## 2016-03-30 DIAGNOSIS — L97511 Non-pressure chronic ulcer of other part of right foot limited to breakdown of skin: Secondary | ICD-10-CM | POA: Insufficient documentation

## 2016-03-30 DIAGNOSIS — L89899 Pressure ulcer of other site, unspecified stage: Secondary | ICD-10-CM | POA: Insufficient documentation

## 2016-03-30 DIAGNOSIS — E1122 Type 2 diabetes mellitus with diabetic chronic kidney disease: Secondary | ICD-10-CM | POA: Insufficient documentation

## 2016-03-30 DIAGNOSIS — L84 Corns and callosities: Secondary | ICD-10-CM | POA: Diagnosis not present

## 2016-03-31 DIAGNOSIS — E1121 Type 2 diabetes mellitus with diabetic nephropathy: Secondary | ICD-10-CM | POA: Diagnosis not present

## 2016-03-31 DIAGNOSIS — S91309A Unspecified open wound, unspecified foot, initial encounter: Secondary | ICD-10-CM | POA: Diagnosis not present

## 2016-03-31 DIAGNOSIS — N186 End stage renal disease: Secondary | ICD-10-CM | POA: Diagnosis not present

## 2016-03-31 DIAGNOSIS — N2581 Secondary hyperparathyroidism of renal origin: Secondary | ICD-10-CM | POA: Diagnosis not present

## 2016-04-02 DIAGNOSIS — S91309A Unspecified open wound, unspecified foot, initial encounter: Secondary | ICD-10-CM | POA: Diagnosis not present

## 2016-04-02 DIAGNOSIS — N2581 Secondary hyperparathyroidism of renal origin: Secondary | ICD-10-CM | POA: Diagnosis not present

## 2016-04-02 DIAGNOSIS — N186 End stage renal disease: Secondary | ICD-10-CM | POA: Diagnosis not present

## 2016-04-02 DIAGNOSIS — E1121 Type 2 diabetes mellitus with diabetic nephropathy: Secondary | ICD-10-CM | POA: Diagnosis not present

## 2016-04-05 DIAGNOSIS — N2581 Secondary hyperparathyroidism of renal origin: Secondary | ICD-10-CM | POA: Diagnosis not present

## 2016-04-05 DIAGNOSIS — N186 End stage renal disease: Secondary | ICD-10-CM | POA: Diagnosis not present

## 2016-04-05 DIAGNOSIS — E1121 Type 2 diabetes mellitus with diabetic nephropathy: Secondary | ICD-10-CM | POA: Diagnosis not present

## 2016-04-05 DIAGNOSIS — S91309A Unspecified open wound, unspecified foot, initial encounter: Secondary | ICD-10-CM | POA: Diagnosis not present

## 2016-04-06 DIAGNOSIS — E1140 Type 2 diabetes mellitus with diabetic neuropathy, unspecified: Secondary | ICD-10-CM | POA: Diagnosis not present

## 2016-04-06 DIAGNOSIS — L97511 Non-pressure chronic ulcer of other part of right foot limited to breakdown of skin: Secondary | ICD-10-CM | POA: Diagnosis not present

## 2016-04-06 DIAGNOSIS — L89899 Pressure ulcer of other site, unspecified stage: Secondary | ICD-10-CM | POA: Diagnosis not present

## 2016-04-06 DIAGNOSIS — Z8631 Personal history of diabetic foot ulcer: Secondary | ICD-10-CM | POA: Diagnosis not present

## 2016-04-06 DIAGNOSIS — E11621 Type 2 diabetes mellitus with foot ulcer: Secondary | ICD-10-CM | POA: Diagnosis not present

## 2016-04-06 DIAGNOSIS — L97522 Non-pressure chronic ulcer of other part of left foot with fat layer exposed: Secondary | ICD-10-CM | POA: Diagnosis not present

## 2016-04-06 DIAGNOSIS — E1151 Type 2 diabetes mellitus with diabetic peripheral angiopathy without gangrene: Secondary | ICD-10-CM | POA: Diagnosis not present

## 2016-04-07 DIAGNOSIS — E1121 Type 2 diabetes mellitus with diabetic nephropathy: Secondary | ICD-10-CM | POA: Diagnosis not present

## 2016-04-07 DIAGNOSIS — N2581 Secondary hyperparathyroidism of renal origin: Secondary | ICD-10-CM | POA: Diagnosis not present

## 2016-04-07 DIAGNOSIS — N186 End stage renal disease: Secondary | ICD-10-CM | POA: Diagnosis not present

## 2016-04-07 DIAGNOSIS — S91309A Unspecified open wound, unspecified foot, initial encounter: Secondary | ICD-10-CM | POA: Diagnosis not present

## 2016-04-09 DIAGNOSIS — E1121 Type 2 diabetes mellitus with diabetic nephropathy: Secondary | ICD-10-CM | POA: Diagnosis not present

## 2016-04-09 DIAGNOSIS — N2581 Secondary hyperparathyroidism of renal origin: Secondary | ICD-10-CM | POA: Diagnosis not present

## 2016-04-09 DIAGNOSIS — N186 End stage renal disease: Secondary | ICD-10-CM | POA: Diagnosis not present

## 2016-04-09 DIAGNOSIS — S91309A Unspecified open wound, unspecified foot, initial encounter: Secondary | ICD-10-CM | POA: Diagnosis not present

## 2016-04-12 DIAGNOSIS — N2581 Secondary hyperparathyroidism of renal origin: Secondary | ICD-10-CM | POA: Diagnosis not present

## 2016-04-12 DIAGNOSIS — S91309A Unspecified open wound, unspecified foot, initial encounter: Secondary | ICD-10-CM | POA: Diagnosis not present

## 2016-04-12 DIAGNOSIS — E1121 Type 2 diabetes mellitus with diabetic nephropathy: Secondary | ICD-10-CM | POA: Diagnosis not present

## 2016-04-12 DIAGNOSIS — N186 End stage renal disease: Secondary | ICD-10-CM | POA: Diagnosis not present

## 2016-04-13 DIAGNOSIS — L97511 Non-pressure chronic ulcer of other part of right foot limited to breakdown of skin: Secondary | ICD-10-CM | POA: Diagnosis not present

## 2016-04-13 DIAGNOSIS — N185 Chronic kidney disease, stage 5: Secondary | ICD-10-CM | POA: Diagnosis not present

## 2016-04-13 DIAGNOSIS — E1151 Type 2 diabetes mellitus with diabetic peripheral angiopathy without gangrene: Secondary | ICD-10-CM | POA: Diagnosis not present

## 2016-04-13 DIAGNOSIS — E11621 Type 2 diabetes mellitus with foot ulcer: Secondary | ICD-10-CM | POA: Diagnosis not present

## 2016-04-13 DIAGNOSIS — L89899 Pressure ulcer of other site, unspecified stage: Secondary | ICD-10-CM | POA: Diagnosis not present

## 2016-04-13 DIAGNOSIS — E1140 Type 2 diabetes mellitus with diabetic neuropathy, unspecified: Secondary | ICD-10-CM | POA: Diagnosis not present

## 2016-04-13 DIAGNOSIS — L97522 Non-pressure chronic ulcer of other part of left foot with fat layer exposed: Secondary | ICD-10-CM | POA: Diagnosis not present

## 2016-04-14 DIAGNOSIS — N186 End stage renal disease: Secondary | ICD-10-CM | POA: Diagnosis not present

## 2016-04-14 DIAGNOSIS — S91309A Unspecified open wound, unspecified foot, initial encounter: Secondary | ICD-10-CM | POA: Diagnosis not present

## 2016-04-14 DIAGNOSIS — N2581 Secondary hyperparathyroidism of renal origin: Secondary | ICD-10-CM | POA: Diagnosis not present

## 2016-04-14 DIAGNOSIS — E1121 Type 2 diabetes mellitus with diabetic nephropathy: Secondary | ICD-10-CM | POA: Diagnosis not present

## 2016-04-16 DIAGNOSIS — S91309A Unspecified open wound, unspecified foot, initial encounter: Secondary | ICD-10-CM | POA: Diagnosis not present

## 2016-04-16 DIAGNOSIS — E1121 Type 2 diabetes mellitus with diabetic nephropathy: Secondary | ICD-10-CM | POA: Diagnosis not present

## 2016-04-16 DIAGNOSIS — N2581 Secondary hyperparathyroidism of renal origin: Secondary | ICD-10-CM | POA: Diagnosis not present

## 2016-04-16 DIAGNOSIS — N186 End stage renal disease: Secondary | ICD-10-CM | POA: Diagnosis not present

## 2016-04-19 DIAGNOSIS — N2581 Secondary hyperparathyroidism of renal origin: Secondary | ICD-10-CM | POA: Diagnosis not present

## 2016-04-19 DIAGNOSIS — E1121 Type 2 diabetes mellitus with diabetic nephropathy: Secondary | ICD-10-CM | POA: Diagnosis not present

## 2016-04-19 DIAGNOSIS — S91309A Unspecified open wound, unspecified foot, initial encounter: Secondary | ICD-10-CM | POA: Diagnosis not present

## 2016-04-19 DIAGNOSIS — N186 End stage renal disease: Secondary | ICD-10-CM | POA: Diagnosis not present

## 2016-04-20 DIAGNOSIS — E1140 Type 2 diabetes mellitus with diabetic neuropathy, unspecified: Secondary | ICD-10-CM | POA: Diagnosis not present

## 2016-04-20 DIAGNOSIS — L89899 Pressure ulcer of other site, unspecified stage: Secondary | ICD-10-CM | POA: Diagnosis not present

## 2016-04-20 DIAGNOSIS — L97511 Non-pressure chronic ulcer of other part of right foot limited to breakdown of skin: Secondary | ICD-10-CM | POA: Diagnosis not present

## 2016-04-20 DIAGNOSIS — Z992 Dependence on renal dialysis: Secondary | ICD-10-CM | POA: Diagnosis not present

## 2016-04-20 DIAGNOSIS — N185 Chronic kidney disease, stage 5: Secondary | ICD-10-CM | POA: Diagnosis not present

## 2016-04-20 DIAGNOSIS — E11621 Type 2 diabetes mellitus with foot ulcer: Secondary | ICD-10-CM | POA: Diagnosis not present

## 2016-04-20 DIAGNOSIS — L97522 Non-pressure chronic ulcer of other part of left foot with fat layer exposed: Secondary | ICD-10-CM | POA: Diagnosis not present

## 2016-04-20 DIAGNOSIS — E1151 Type 2 diabetes mellitus with diabetic peripheral angiopathy without gangrene: Secondary | ICD-10-CM | POA: Diagnosis not present

## 2016-04-21 DIAGNOSIS — N186 End stage renal disease: Secondary | ICD-10-CM | POA: Diagnosis not present

## 2016-04-21 DIAGNOSIS — N2581 Secondary hyperparathyroidism of renal origin: Secondary | ICD-10-CM | POA: Diagnosis not present

## 2016-04-21 DIAGNOSIS — E1121 Type 2 diabetes mellitus with diabetic nephropathy: Secondary | ICD-10-CM | POA: Diagnosis not present

## 2016-04-21 DIAGNOSIS — S91309A Unspecified open wound, unspecified foot, initial encounter: Secondary | ICD-10-CM | POA: Diagnosis not present

## 2016-04-23 DIAGNOSIS — N2581 Secondary hyperparathyroidism of renal origin: Secondary | ICD-10-CM | POA: Diagnosis not present

## 2016-04-23 DIAGNOSIS — E1121 Type 2 diabetes mellitus with diabetic nephropathy: Secondary | ICD-10-CM | POA: Diagnosis not present

## 2016-04-23 DIAGNOSIS — N186 End stage renal disease: Secondary | ICD-10-CM | POA: Diagnosis not present

## 2016-04-23 DIAGNOSIS — S91309A Unspecified open wound, unspecified foot, initial encounter: Secondary | ICD-10-CM | POA: Diagnosis not present

## 2016-04-25 DIAGNOSIS — Z992 Dependence on renal dialysis: Secondary | ICD-10-CM | POA: Diagnosis not present

## 2016-04-25 DIAGNOSIS — N186 End stage renal disease: Secondary | ICD-10-CM | POA: Diagnosis not present

## 2016-04-25 DIAGNOSIS — E1122 Type 2 diabetes mellitus with diabetic chronic kidney disease: Secondary | ICD-10-CM | POA: Diagnosis not present

## 2016-04-26 DIAGNOSIS — N2581 Secondary hyperparathyroidism of renal origin: Secondary | ICD-10-CM | POA: Diagnosis not present

## 2016-04-26 DIAGNOSIS — E1121 Type 2 diabetes mellitus with diabetic nephropathy: Secondary | ICD-10-CM | POA: Diagnosis not present

## 2016-04-26 DIAGNOSIS — N186 End stage renal disease: Secondary | ICD-10-CM | POA: Diagnosis not present

## 2016-04-27 ENCOUNTER — Encounter (HOSPITAL_BASED_OUTPATIENT_CLINIC_OR_DEPARTMENT_OTHER): Payer: Medicare Other | Attending: Surgery

## 2016-04-27 DIAGNOSIS — L97422 Non-pressure chronic ulcer of left heel and midfoot with fat layer exposed: Secondary | ICD-10-CM | POA: Insufficient documentation

## 2016-04-27 DIAGNOSIS — L97511 Non-pressure chronic ulcer of other part of right foot limited to breakdown of skin: Secondary | ICD-10-CM | POA: Insufficient documentation

## 2016-04-27 DIAGNOSIS — L97412 Non-pressure chronic ulcer of right heel and midfoot with fat layer exposed: Secondary | ICD-10-CM | POA: Insufficient documentation

## 2016-04-27 DIAGNOSIS — I509 Heart failure, unspecified: Secondary | ICD-10-CM | POA: Insufficient documentation

## 2016-04-27 DIAGNOSIS — N186 End stage renal disease: Secondary | ICD-10-CM | POA: Diagnosis not present

## 2016-04-27 DIAGNOSIS — I132 Hypertensive heart and chronic kidney disease with heart failure and with stage 5 chronic kidney disease, or end stage renal disease: Secondary | ICD-10-CM | POA: Insufficient documentation

## 2016-04-27 DIAGNOSIS — L97512 Non-pressure chronic ulcer of other part of right foot with fat layer exposed: Secondary | ICD-10-CM | POA: Diagnosis not present

## 2016-04-27 DIAGNOSIS — E1122 Type 2 diabetes mellitus with diabetic chronic kidney disease: Secondary | ICD-10-CM | POA: Insufficient documentation

## 2016-04-27 DIAGNOSIS — E1140 Type 2 diabetes mellitus with diabetic neuropathy, unspecified: Secondary | ICD-10-CM | POA: Diagnosis not present

## 2016-04-27 DIAGNOSIS — M1990 Unspecified osteoarthritis, unspecified site: Secondary | ICD-10-CM | POA: Diagnosis not present

## 2016-04-27 DIAGNOSIS — E11621 Type 2 diabetes mellitus with foot ulcer: Secondary | ICD-10-CM | POA: Insufficient documentation

## 2016-04-27 DIAGNOSIS — L97522 Non-pressure chronic ulcer of other part of left foot with fat layer exposed: Secondary | ICD-10-CM | POA: Diagnosis not present

## 2016-04-27 DIAGNOSIS — I110 Hypertensive heart disease with heart failure: Secondary | ICD-10-CM | POA: Insufficient documentation

## 2016-04-27 DIAGNOSIS — Z8631 Personal history of diabetic foot ulcer: Secondary | ICD-10-CM | POA: Diagnosis not present

## 2016-04-28 DIAGNOSIS — N2581 Secondary hyperparathyroidism of renal origin: Secondary | ICD-10-CM | POA: Diagnosis not present

## 2016-04-28 DIAGNOSIS — N186 End stage renal disease: Secondary | ICD-10-CM | POA: Diagnosis not present

## 2016-04-28 DIAGNOSIS — E1121 Type 2 diabetes mellitus with diabetic nephropathy: Secondary | ICD-10-CM | POA: Diagnosis not present

## 2016-04-30 DIAGNOSIS — E1121 Type 2 diabetes mellitus with diabetic nephropathy: Secondary | ICD-10-CM | POA: Diagnosis not present

## 2016-04-30 DIAGNOSIS — N2581 Secondary hyperparathyroidism of renal origin: Secondary | ICD-10-CM | POA: Diagnosis not present

## 2016-04-30 DIAGNOSIS — N186 End stage renal disease: Secondary | ICD-10-CM | POA: Diagnosis not present

## 2016-05-03 DIAGNOSIS — N186 End stage renal disease: Secondary | ICD-10-CM | POA: Diagnosis not present

## 2016-05-03 DIAGNOSIS — N2581 Secondary hyperparathyroidism of renal origin: Secondary | ICD-10-CM | POA: Diagnosis not present

## 2016-05-03 DIAGNOSIS — E1121 Type 2 diabetes mellitus with diabetic nephropathy: Secondary | ICD-10-CM | POA: Diagnosis not present

## 2016-05-04 DIAGNOSIS — E11621 Type 2 diabetes mellitus with foot ulcer: Secondary | ICD-10-CM | POA: Diagnosis not present

## 2016-05-04 DIAGNOSIS — I110 Hypertensive heart disease with heart failure: Secondary | ICD-10-CM | POA: Diagnosis not present

## 2016-05-04 DIAGNOSIS — L97512 Non-pressure chronic ulcer of other part of right foot with fat layer exposed: Secondary | ICD-10-CM | POA: Diagnosis not present

## 2016-05-04 DIAGNOSIS — L97412 Non-pressure chronic ulcer of right heel and midfoot with fat layer exposed: Secondary | ICD-10-CM | POA: Diagnosis not present

## 2016-05-04 DIAGNOSIS — N185 Chronic kidney disease, stage 5: Secondary | ICD-10-CM | POA: Diagnosis not present

## 2016-05-04 DIAGNOSIS — L97422 Non-pressure chronic ulcer of left heel and midfoot with fat layer exposed: Secondary | ICD-10-CM | POA: Diagnosis not present

## 2016-05-04 DIAGNOSIS — I509 Heart failure, unspecified: Secondary | ICD-10-CM | POA: Diagnosis not present

## 2016-05-04 DIAGNOSIS — L97511 Non-pressure chronic ulcer of other part of right foot limited to breakdown of skin: Secondary | ICD-10-CM | POA: Diagnosis not present

## 2016-05-04 DIAGNOSIS — L97522 Non-pressure chronic ulcer of other part of left foot with fat layer exposed: Secondary | ICD-10-CM | POA: Diagnosis not present

## 2016-05-05 DIAGNOSIS — N186 End stage renal disease: Secondary | ICD-10-CM | POA: Diagnosis not present

## 2016-05-05 DIAGNOSIS — E1121 Type 2 diabetes mellitus with diabetic nephropathy: Secondary | ICD-10-CM | POA: Diagnosis not present

## 2016-05-05 DIAGNOSIS — N2581 Secondary hyperparathyroidism of renal origin: Secondary | ICD-10-CM | POA: Diagnosis not present

## 2016-05-06 DIAGNOSIS — L97509 Non-pressure chronic ulcer of other part of unspecified foot with unspecified severity: Secondary | ICD-10-CM | POA: Diagnosis not present

## 2016-05-06 DIAGNOSIS — E119 Type 2 diabetes mellitus without complications: Secondary | ICD-10-CM | POA: Diagnosis not present

## 2016-05-06 DIAGNOSIS — I10 Essential (primary) hypertension: Secondary | ICD-10-CM | POA: Diagnosis not present

## 2016-05-06 DIAGNOSIS — N280 Ischemia and infarction of kidney: Secondary | ICD-10-CM | POA: Diagnosis not present

## 2016-05-06 DIAGNOSIS — Z5181 Encounter for therapeutic drug level monitoring: Secondary | ICD-10-CM | POA: Diagnosis not present

## 2016-05-06 DIAGNOSIS — N289 Disorder of kidney and ureter, unspecified: Secondary | ICD-10-CM | POA: Diagnosis not present

## 2016-05-07 DIAGNOSIS — N2581 Secondary hyperparathyroidism of renal origin: Secondary | ICD-10-CM | POA: Diagnosis not present

## 2016-05-07 DIAGNOSIS — E1121 Type 2 diabetes mellitus with diabetic nephropathy: Secondary | ICD-10-CM | POA: Diagnosis not present

## 2016-05-07 DIAGNOSIS — N186 End stage renal disease: Secondary | ICD-10-CM | POA: Diagnosis not present

## 2016-05-10 DIAGNOSIS — E1121 Type 2 diabetes mellitus with diabetic nephropathy: Secondary | ICD-10-CM | POA: Diagnosis not present

## 2016-05-10 DIAGNOSIS — N2581 Secondary hyperparathyroidism of renal origin: Secondary | ICD-10-CM | POA: Diagnosis not present

## 2016-05-10 DIAGNOSIS — N186 End stage renal disease: Secondary | ICD-10-CM | POA: Diagnosis not present

## 2016-05-11 DIAGNOSIS — L84 Corns and callosities: Secondary | ICD-10-CM | POA: Diagnosis not present

## 2016-05-11 DIAGNOSIS — E11621 Type 2 diabetes mellitus with foot ulcer: Secondary | ICD-10-CM | POA: Diagnosis not present

## 2016-05-11 DIAGNOSIS — I509 Heart failure, unspecified: Secondary | ICD-10-CM | POA: Diagnosis not present

## 2016-05-11 DIAGNOSIS — L97422 Non-pressure chronic ulcer of left heel and midfoot with fat layer exposed: Secondary | ICD-10-CM | POA: Diagnosis not present

## 2016-05-11 DIAGNOSIS — I110 Hypertensive heart disease with heart failure: Secondary | ICD-10-CM | POA: Diagnosis not present

## 2016-05-11 DIAGNOSIS — L97522 Non-pressure chronic ulcer of other part of left foot with fat layer exposed: Secondary | ICD-10-CM | POA: Diagnosis not present

## 2016-05-11 DIAGNOSIS — L97412 Non-pressure chronic ulcer of right heel and midfoot with fat layer exposed: Secondary | ICD-10-CM | POA: Diagnosis not present

## 2016-05-11 DIAGNOSIS — Z992 Dependence on renal dialysis: Secondary | ICD-10-CM | POA: Diagnosis not present

## 2016-05-11 DIAGNOSIS — L97512 Non-pressure chronic ulcer of other part of right foot with fat layer exposed: Secondary | ICD-10-CM | POA: Diagnosis not present

## 2016-05-11 DIAGNOSIS — L97511 Non-pressure chronic ulcer of other part of right foot limited to breakdown of skin: Secondary | ICD-10-CM | POA: Diagnosis not present

## 2016-05-12 DIAGNOSIS — N2581 Secondary hyperparathyroidism of renal origin: Secondary | ICD-10-CM | POA: Diagnosis not present

## 2016-05-12 DIAGNOSIS — N186 End stage renal disease: Secondary | ICD-10-CM | POA: Diagnosis not present

## 2016-05-12 DIAGNOSIS — E1121 Type 2 diabetes mellitus with diabetic nephropathy: Secondary | ICD-10-CM | POA: Diagnosis not present

## 2016-05-14 DIAGNOSIS — N186 End stage renal disease: Secondary | ICD-10-CM | POA: Diagnosis not present

## 2016-05-14 DIAGNOSIS — N2581 Secondary hyperparathyroidism of renal origin: Secondary | ICD-10-CM | POA: Diagnosis not present

## 2016-05-14 DIAGNOSIS — E1121 Type 2 diabetes mellitus with diabetic nephropathy: Secondary | ICD-10-CM | POA: Diagnosis not present

## 2016-05-17 DIAGNOSIS — N186 End stage renal disease: Secondary | ICD-10-CM | POA: Diagnosis not present

## 2016-05-17 DIAGNOSIS — E1121 Type 2 diabetes mellitus with diabetic nephropathy: Secondary | ICD-10-CM | POA: Diagnosis not present

## 2016-05-17 DIAGNOSIS — N2581 Secondary hyperparathyroidism of renal origin: Secondary | ICD-10-CM | POA: Diagnosis not present

## 2016-05-18 ENCOUNTER — Ambulatory Visit (HOSPITAL_COMMUNITY)
Admission: RE | Admit: 2016-05-18 | Discharge: 2016-05-18 | Disposition: A | Discharge status: Home / Self Care | Payer: Medicare Other | Source: Ambulatory Visit | Attending: Surgery | Admitting: Surgery

## 2016-05-18 ENCOUNTER — Other Ambulatory Visit: Payer: Self-pay | Admitting: Surgery

## 2016-05-18 DIAGNOSIS — L97521 Non-pressure chronic ulcer of other part of left foot limited to breakdown of skin: Secondary | ICD-10-CM | POA: Insufficient documentation

## 2016-05-18 DIAGNOSIS — M86171 Other acute osteomyelitis, right ankle and foot: Secondary | ICD-10-CM | POA: Diagnosis not present

## 2016-05-18 DIAGNOSIS — L97511 Non-pressure chronic ulcer of other part of right foot limited to breakdown of skin: Secondary | ICD-10-CM | POA: Diagnosis not present

## 2016-05-18 DIAGNOSIS — E11621 Type 2 diabetes mellitus with foot ulcer: Secondary | ICD-10-CM | POA: Insufficient documentation

## 2016-05-18 DIAGNOSIS — Z9889 Other specified postprocedural states: Secondary | ICD-10-CM | POA: Insufficient documentation

## 2016-05-18 DIAGNOSIS — Z89422 Acquired absence of other left toe(s): Secondary | ICD-10-CM | POA: Diagnosis not present

## 2016-05-18 DIAGNOSIS — M869 Osteomyelitis, unspecified: Secondary | ICD-10-CM

## 2016-05-18 DIAGNOSIS — I509 Heart failure, unspecified: Secondary | ICD-10-CM | POA: Diagnosis not present

## 2016-05-18 DIAGNOSIS — L97412 Non-pressure chronic ulcer of right heel and midfoot with fat layer exposed: Secondary | ICD-10-CM | POA: Diagnosis not present

## 2016-05-18 DIAGNOSIS — M86172 Other acute osteomyelitis, left ankle and foot: Secondary | ICD-10-CM | POA: Diagnosis not present

## 2016-05-18 DIAGNOSIS — L97512 Non-pressure chronic ulcer of other part of right foot with fat layer exposed: Secondary | ICD-10-CM | POA: Diagnosis not present

## 2016-05-18 DIAGNOSIS — L97422 Non-pressure chronic ulcer of left heel and midfoot with fat layer exposed: Secondary | ICD-10-CM | POA: Diagnosis not present

## 2016-05-18 DIAGNOSIS — L97522 Non-pressure chronic ulcer of other part of left foot with fat layer exposed: Secondary | ICD-10-CM | POA: Diagnosis not present

## 2016-05-18 DIAGNOSIS — I110 Hypertensive heart disease with heart failure: Secondary | ICD-10-CM | POA: Diagnosis not present

## 2016-05-19 DIAGNOSIS — E1121 Type 2 diabetes mellitus with diabetic nephropathy: Secondary | ICD-10-CM | POA: Diagnosis not present

## 2016-05-19 DIAGNOSIS — N186 End stage renal disease: Secondary | ICD-10-CM | POA: Diagnosis not present

## 2016-05-19 DIAGNOSIS — N2581 Secondary hyperparathyroidism of renal origin: Secondary | ICD-10-CM | POA: Diagnosis not present

## 2016-05-21 DIAGNOSIS — N2581 Secondary hyperparathyroidism of renal origin: Secondary | ICD-10-CM | POA: Diagnosis not present

## 2016-05-21 DIAGNOSIS — E1121 Type 2 diabetes mellitus with diabetic nephropathy: Secondary | ICD-10-CM | POA: Diagnosis not present

## 2016-05-21 DIAGNOSIS — N186 End stage renal disease: Secondary | ICD-10-CM | POA: Diagnosis not present

## 2016-05-25 DIAGNOSIS — L97422 Non-pressure chronic ulcer of left heel and midfoot with fat layer exposed: Secondary | ICD-10-CM | POA: Diagnosis not present

## 2016-05-25 DIAGNOSIS — N185 Chronic kidney disease, stage 5: Secondary | ICD-10-CM | POA: Diagnosis not present

## 2016-05-25 DIAGNOSIS — E11621 Type 2 diabetes mellitus with foot ulcer: Secondary | ICD-10-CM | POA: Diagnosis not present

## 2016-05-25 DIAGNOSIS — L97511 Non-pressure chronic ulcer of other part of right foot limited to breakdown of skin: Secondary | ICD-10-CM | POA: Diagnosis not present

## 2016-05-25 DIAGNOSIS — L97521 Non-pressure chronic ulcer of other part of left foot limited to breakdown of skin: Secondary | ICD-10-CM | POA: Diagnosis not present

## 2016-05-25 DIAGNOSIS — L97412 Non-pressure chronic ulcer of right heel and midfoot with fat layer exposed: Secondary | ICD-10-CM | POA: Diagnosis not present

## 2016-05-25 DIAGNOSIS — I110 Hypertensive heart disease with heart failure: Secondary | ICD-10-CM | POA: Diagnosis not present

## 2016-05-25 DIAGNOSIS — I509 Heart failure, unspecified: Secondary | ICD-10-CM | POA: Diagnosis not present

## 2016-05-25 DIAGNOSIS — L97512 Non-pressure chronic ulcer of other part of right foot with fat layer exposed: Secondary | ICD-10-CM | POA: Diagnosis not present

## 2016-05-26 ENCOUNTER — Other Ambulatory Visit: Payer: Self-pay | Admitting: Surgery

## 2016-05-26 DIAGNOSIS — L97519 Non-pressure chronic ulcer of other part of right foot with unspecified severity: Principal | ICD-10-CM

## 2016-05-26 DIAGNOSIS — N186 End stage renal disease: Secondary | ICD-10-CM | POA: Diagnosis not present

## 2016-05-26 DIAGNOSIS — E13621 Other specified diabetes mellitus with foot ulcer: Secondary | ICD-10-CM

## 2016-05-26 DIAGNOSIS — L97529 Non-pressure chronic ulcer of other part of left foot with unspecified severity: Principal | ICD-10-CM

## 2016-05-26 DIAGNOSIS — Z992 Dependence on renal dialysis: Secondary | ICD-10-CM | POA: Diagnosis not present

## 2016-05-26 DIAGNOSIS — E1121 Type 2 diabetes mellitus with diabetic nephropathy: Secondary | ICD-10-CM | POA: Diagnosis not present

## 2016-05-26 DIAGNOSIS — E1122 Type 2 diabetes mellitus with diabetic chronic kidney disease: Secondary | ICD-10-CM | POA: Diagnosis not present

## 2016-05-26 DIAGNOSIS — N2581 Secondary hyperparathyroidism of renal origin: Secondary | ICD-10-CM | POA: Diagnosis not present

## 2016-05-28 DIAGNOSIS — E1121 Type 2 diabetes mellitus with diabetic nephropathy: Secondary | ICD-10-CM | POA: Diagnosis not present

## 2016-05-28 DIAGNOSIS — N2581 Secondary hyperparathyroidism of renal origin: Secondary | ICD-10-CM | POA: Diagnosis not present

## 2016-05-28 DIAGNOSIS — S91309A Unspecified open wound, unspecified foot, initial encounter: Secondary | ICD-10-CM | POA: Diagnosis not present

## 2016-05-28 DIAGNOSIS — N186 End stage renal disease: Secondary | ICD-10-CM | POA: Diagnosis not present

## 2016-05-31 DIAGNOSIS — E1121 Type 2 diabetes mellitus with diabetic nephropathy: Secondary | ICD-10-CM | POA: Diagnosis not present

## 2016-05-31 DIAGNOSIS — S91309A Unspecified open wound, unspecified foot, initial encounter: Secondary | ICD-10-CM | POA: Diagnosis not present

## 2016-05-31 DIAGNOSIS — N2581 Secondary hyperparathyroidism of renal origin: Secondary | ICD-10-CM | POA: Diagnosis not present

## 2016-05-31 DIAGNOSIS — N186 End stage renal disease: Secondary | ICD-10-CM | POA: Diagnosis not present

## 2016-06-01 ENCOUNTER — Other Ambulatory Visit: Payer: Self-pay | Admitting: Surgery

## 2016-06-01 ENCOUNTER — Ambulatory Visit (HOSPITAL_COMMUNITY)
Admission: RE | Admit: 2016-06-01 | Discharge: 2016-06-01 | Disposition: A | Discharge status: Home / Self Care | Payer: Medicare Other | Source: Ambulatory Visit | Attending: Surgery | Admitting: Surgery

## 2016-06-01 ENCOUNTER — Encounter (HOSPITAL_BASED_OUTPATIENT_CLINIC_OR_DEPARTMENT_OTHER): Payer: Medicare Other | Attending: Surgery

## 2016-06-01 DIAGNOSIS — L97412 Non-pressure chronic ulcer of right heel and midfoot with fat layer exposed: Secondary | ICD-10-CM | POA: Diagnosis not present

## 2016-06-01 DIAGNOSIS — L97519 Non-pressure chronic ulcer of other part of right foot with unspecified severity: Principal | ICD-10-CM

## 2016-06-01 DIAGNOSIS — E1151 Type 2 diabetes mellitus with diabetic peripheral angiopathy without gangrene: Secondary | ICD-10-CM | POA: Diagnosis not present

## 2016-06-01 DIAGNOSIS — I509 Heart failure, unspecified: Secondary | ICD-10-CM | POA: Diagnosis not present

## 2016-06-01 DIAGNOSIS — I130 Hypertensive heart and chronic kidney disease with heart failure and stage 1 through stage 4 chronic kidney disease, or unspecified chronic kidney disease: Secondary | ICD-10-CM | POA: Diagnosis not present

## 2016-06-01 DIAGNOSIS — E1122 Type 2 diabetes mellitus with diabetic chronic kidney disease: Secondary | ICD-10-CM | POA: Insufficient documentation

## 2016-06-01 DIAGNOSIS — E1140 Type 2 diabetes mellitus with diabetic neuropathy, unspecified: Secondary | ICD-10-CM | POA: Insufficient documentation

## 2016-06-01 DIAGNOSIS — Z8631 Personal history of diabetic foot ulcer: Secondary | ICD-10-CM | POA: Diagnosis not present

## 2016-06-01 DIAGNOSIS — E11621 Type 2 diabetes mellitus with foot ulcer: Secondary | ICD-10-CM | POA: Diagnosis not present

## 2016-06-01 DIAGNOSIS — M1990 Unspecified osteoarthritis, unspecified site: Secondary | ICD-10-CM | POA: Diagnosis not present

## 2016-06-01 DIAGNOSIS — L97529 Non-pressure chronic ulcer of other part of left foot with unspecified severity: Principal | ICD-10-CM

## 2016-06-01 DIAGNOSIS — Z992 Dependence on renal dialysis: Secondary | ICD-10-CM | POA: Diagnosis not present

## 2016-06-01 DIAGNOSIS — N186 End stage renal disease: Secondary | ICD-10-CM | POA: Diagnosis not present

## 2016-06-01 DIAGNOSIS — E13621 Other specified diabetes mellitus with foot ulcer: Secondary | ICD-10-CM

## 2016-06-01 DIAGNOSIS — L97512 Non-pressure chronic ulcer of other part of right foot with fat layer exposed: Secondary | ICD-10-CM | POA: Diagnosis not present

## 2016-06-01 DIAGNOSIS — L97521 Non-pressure chronic ulcer of other part of left foot limited to breakdown of skin: Secondary | ICD-10-CM | POA: Insufficient documentation

## 2016-06-02 DIAGNOSIS — S91309A Unspecified open wound, unspecified foot, initial encounter: Secondary | ICD-10-CM | POA: Diagnosis not present

## 2016-06-02 DIAGNOSIS — E1121 Type 2 diabetes mellitus with diabetic nephropathy: Secondary | ICD-10-CM | POA: Diagnosis not present

## 2016-06-02 DIAGNOSIS — N186 End stage renal disease: Secondary | ICD-10-CM | POA: Diagnosis not present

## 2016-06-02 DIAGNOSIS — N2581 Secondary hyperparathyroidism of renal origin: Secondary | ICD-10-CM | POA: Diagnosis not present

## 2016-06-03 ENCOUNTER — Inpatient Hospital Stay (HOSPITAL_COMMUNITY)
Admission: RE | Admit: 2016-06-03 | Discharge: 2016-06-03 | Disposition: A | Discharge status: Home / Self Care | Payer: Medicare Other | Source: Ambulatory Visit | Attending: Surgery | Admitting: Surgery

## 2016-06-03 DIAGNOSIS — E13621 Other specified diabetes mellitus with foot ulcer: Secondary | ICD-10-CM

## 2016-06-03 DIAGNOSIS — L97519 Non-pressure chronic ulcer of other part of right foot with unspecified severity: Principal | ICD-10-CM

## 2016-06-03 DIAGNOSIS — L97529 Non-pressure chronic ulcer of other part of left foot with unspecified severity: Principal | ICD-10-CM

## 2016-06-04 DIAGNOSIS — N186 End stage renal disease: Secondary | ICD-10-CM | POA: Diagnosis not present

## 2016-06-04 DIAGNOSIS — E1121 Type 2 diabetes mellitus with diabetic nephropathy: Secondary | ICD-10-CM | POA: Diagnosis not present

## 2016-06-04 DIAGNOSIS — N2581 Secondary hyperparathyroidism of renal origin: Secondary | ICD-10-CM | POA: Diagnosis not present

## 2016-06-04 DIAGNOSIS — S91309A Unspecified open wound, unspecified foot, initial encounter: Secondary | ICD-10-CM | POA: Diagnosis not present

## 2016-06-07 DIAGNOSIS — E1121 Type 2 diabetes mellitus with diabetic nephropathy: Secondary | ICD-10-CM | POA: Diagnosis not present

## 2016-06-07 DIAGNOSIS — N2581 Secondary hyperparathyroidism of renal origin: Secondary | ICD-10-CM | POA: Diagnosis not present

## 2016-06-07 DIAGNOSIS — N186 End stage renal disease: Secondary | ICD-10-CM | POA: Diagnosis not present

## 2016-06-07 DIAGNOSIS — S91309A Unspecified open wound, unspecified foot, initial encounter: Secondary | ICD-10-CM | POA: Diagnosis not present

## 2016-06-08 DIAGNOSIS — L89892 Pressure ulcer of other site, stage 2: Secondary | ICD-10-CM | POA: Diagnosis not present

## 2016-06-08 DIAGNOSIS — L97412 Non-pressure chronic ulcer of right heel and midfoot with fat layer exposed: Secondary | ICD-10-CM | POA: Diagnosis not present

## 2016-06-08 DIAGNOSIS — Z992 Dependence on renal dialysis: Secondary | ICD-10-CM | POA: Diagnosis not present

## 2016-06-08 DIAGNOSIS — Z8631 Personal history of diabetic foot ulcer: Secondary | ICD-10-CM | POA: Diagnosis not present

## 2016-06-08 DIAGNOSIS — E11621 Type 2 diabetes mellitus with foot ulcer: Secondary | ICD-10-CM | POA: Diagnosis not present

## 2016-06-08 DIAGNOSIS — I130 Hypertensive heart and chronic kidney disease with heart failure and stage 1 through stage 4 chronic kidney disease, or unspecified chronic kidney disease: Secondary | ICD-10-CM | POA: Diagnosis not present

## 2016-06-08 DIAGNOSIS — L97511 Non-pressure chronic ulcer of other part of right foot limited to breakdown of skin: Secondary | ICD-10-CM | POA: Diagnosis not present

## 2016-06-08 DIAGNOSIS — L97521 Non-pressure chronic ulcer of other part of left foot limited to breakdown of skin: Secondary | ICD-10-CM | POA: Diagnosis not present

## 2016-06-08 DIAGNOSIS — I509 Heart failure, unspecified: Secondary | ICD-10-CM | POA: Diagnosis not present

## 2016-06-08 DIAGNOSIS — E1122 Type 2 diabetes mellitus with diabetic chronic kidney disease: Secondary | ICD-10-CM | POA: Diagnosis not present

## 2016-06-09 DIAGNOSIS — N186 End stage renal disease: Secondary | ICD-10-CM | POA: Diagnosis not present

## 2016-06-09 DIAGNOSIS — N2581 Secondary hyperparathyroidism of renal origin: Secondary | ICD-10-CM | POA: Diagnosis not present

## 2016-06-09 DIAGNOSIS — E1121 Type 2 diabetes mellitus with diabetic nephropathy: Secondary | ICD-10-CM | POA: Diagnosis not present

## 2016-06-09 DIAGNOSIS — S91309A Unspecified open wound, unspecified foot, initial encounter: Secondary | ICD-10-CM | POA: Diagnosis not present

## 2016-06-11 DIAGNOSIS — N2581 Secondary hyperparathyroidism of renal origin: Secondary | ICD-10-CM | POA: Diagnosis not present

## 2016-06-11 DIAGNOSIS — E1121 Type 2 diabetes mellitus with diabetic nephropathy: Secondary | ICD-10-CM | POA: Diagnosis not present

## 2016-06-11 DIAGNOSIS — S91309A Unspecified open wound, unspecified foot, initial encounter: Secondary | ICD-10-CM | POA: Diagnosis not present

## 2016-06-11 DIAGNOSIS — N186 End stage renal disease: Secondary | ICD-10-CM | POA: Diagnosis not present

## 2016-06-14 ENCOUNTER — Ambulatory Visit
Admission: RE | Admit: 2016-06-14 | Discharge: 2016-06-14 | Disposition: A | Discharge status: Home / Self Care | Payer: Medicare Other | Source: Ambulatory Visit | Attending: Surgery | Admitting: Surgery

## 2016-06-14 ENCOUNTER — Other Ambulatory Visit: Payer: Self-pay | Admitting: Surgery

## 2016-06-14 ENCOUNTER — Other Ambulatory Visit: Payer: Medicare Other

## 2016-06-14 DIAGNOSIS — L97519 Non-pressure chronic ulcer of other part of right foot with unspecified severity: Principal | ICD-10-CM

## 2016-06-14 DIAGNOSIS — L97529 Non-pressure chronic ulcer of other part of left foot with unspecified severity: Principal | ICD-10-CM

## 2016-06-14 DIAGNOSIS — E1121 Type 2 diabetes mellitus with diabetic nephropathy: Secondary | ICD-10-CM | POA: Diagnosis not present

## 2016-06-14 DIAGNOSIS — E13621 Other specified diabetes mellitus with foot ulcer: Secondary | ICD-10-CM

## 2016-06-14 DIAGNOSIS — N186 End stage renal disease: Secondary | ICD-10-CM | POA: Diagnosis not present

## 2016-06-14 DIAGNOSIS — S91309A Unspecified open wound, unspecified foot, initial encounter: Secondary | ICD-10-CM | POA: Diagnosis not present

## 2016-06-14 DIAGNOSIS — N2581 Secondary hyperparathyroidism of renal origin: Secondary | ICD-10-CM | POA: Diagnosis not present

## 2016-06-14 DIAGNOSIS — S91302A Unspecified open wound, left foot, initial encounter: Secondary | ICD-10-CM | POA: Diagnosis not present

## 2016-06-15 DIAGNOSIS — N185 Chronic kidney disease, stage 5: Secondary | ICD-10-CM | POA: Diagnosis not present

## 2016-06-15 DIAGNOSIS — L97512 Non-pressure chronic ulcer of other part of right foot with fat layer exposed: Secondary | ICD-10-CM | POA: Diagnosis not present

## 2016-06-15 DIAGNOSIS — I130 Hypertensive heart and chronic kidney disease with heart failure and stage 1 through stage 4 chronic kidney disease, or unspecified chronic kidney disease: Secondary | ICD-10-CM | POA: Diagnosis not present

## 2016-06-15 DIAGNOSIS — L97521 Non-pressure chronic ulcer of other part of left foot limited to breakdown of skin: Secondary | ICD-10-CM | POA: Diagnosis not present

## 2016-06-15 DIAGNOSIS — E11621 Type 2 diabetes mellitus with foot ulcer: Secondary | ICD-10-CM | POA: Diagnosis not present

## 2016-06-15 DIAGNOSIS — I509 Heart failure, unspecified: Secondary | ICD-10-CM | POA: Diagnosis not present

## 2016-06-15 DIAGNOSIS — E1122 Type 2 diabetes mellitus with diabetic chronic kidney disease: Secondary | ICD-10-CM | POA: Diagnosis not present

## 2016-06-15 DIAGNOSIS — L97412 Non-pressure chronic ulcer of right heel and midfoot with fat layer exposed: Secondary | ICD-10-CM | POA: Diagnosis not present

## 2016-06-15 DIAGNOSIS — L97522 Non-pressure chronic ulcer of other part of left foot with fat layer exposed: Secondary | ICD-10-CM | POA: Diagnosis not present

## 2016-06-16 DIAGNOSIS — S91309A Unspecified open wound, unspecified foot, initial encounter: Secondary | ICD-10-CM | POA: Diagnosis not present

## 2016-06-16 DIAGNOSIS — N186 End stage renal disease: Secondary | ICD-10-CM | POA: Diagnosis not present

## 2016-06-16 DIAGNOSIS — N2581 Secondary hyperparathyroidism of renal origin: Secondary | ICD-10-CM | POA: Diagnosis not present

## 2016-06-16 DIAGNOSIS — E1121 Type 2 diabetes mellitus with diabetic nephropathy: Secondary | ICD-10-CM | POA: Diagnosis not present

## 2016-06-18 DIAGNOSIS — N2581 Secondary hyperparathyroidism of renal origin: Secondary | ICD-10-CM | POA: Diagnosis not present

## 2016-06-18 DIAGNOSIS — S91309A Unspecified open wound, unspecified foot, initial encounter: Secondary | ICD-10-CM | POA: Diagnosis not present

## 2016-06-18 DIAGNOSIS — E1121 Type 2 diabetes mellitus with diabetic nephropathy: Secondary | ICD-10-CM | POA: Diagnosis not present

## 2016-06-18 DIAGNOSIS — N186 End stage renal disease: Secondary | ICD-10-CM | POA: Diagnosis not present

## 2016-06-21 DIAGNOSIS — N186 End stage renal disease: Secondary | ICD-10-CM | POA: Diagnosis not present

## 2016-06-21 DIAGNOSIS — E1121 Type 2 diabetes mellitus with diabetic nephropathy: Secondary | ICD-10-CM | POA: Diagnosis not present

## 2016-06-21 DIAGNOSIS — S91309A Unspecified open wound, unspecified foot, initial encounter: Secondary | ICD-10-CM | POA: Diagnosis not present

## 2016-06-21 DIAGNOSIS — N2581 Secondary hyperparathyroidism of renal origin: Secondary | ICD-10-CM | POA: Diagnosis not present

## 2016-06-22 DIAGNOSIS — L97412 Non-pressure chronic ulcer of right heel and midfoot with fat layer exposed: Secondary | ICD-10-CM | POA: Diagnosis not present

## 2016-06-22 DIAGNOSIS — L97512 Non-pressure chronic ulcer of other part of right foot with fat layer exposed: Secondary | ICD-10-CM | POA: Diagnosis not present

## 2016-06-22 DIAGNOSIS — L97521 Non-pressure chronic ulcer of other part of left foot limited to breakdown of skin: Secondary | ICD-10-CM | POA: Diagnosis not present

## 2016-06-22 DIAGNOSIS — I509 Heart failure, unspecified: Secondary | ICD-10-CM | POA: Diagnosis not present

## 2016-06-22 DIAGNOSIS — I130 Hypertensive heart and chronic kidney disease with heart failure and stage 1 through stage 4 chronic kidney disease, or unspecified chronic kidney disease: Secondary | ICD-10-CM | POA: Diagnosis not present

## 2016-06-22 DIAGNOSIS — E1122 Type 2 diabetes mellitus with diabetic chronic kidney disease: Secondary | ICD-10-CM | POA: Diagnosis not present

## 2016-06-22 DIAGNOSIS — L97511 Non-pressure chronic ulcer of other part of right foot limited to breakdown of skin: Secondary | ICD-10-CM | POA: Diagnosis not present

## 2016-06-22 DIAGNOSIS — E11621 Type 2 diabetes mellitus with foot ulcer: Secondary | ICD-10-CM | POA: Diagnosis not present

## 2016-06-22 LAB — GLUCOSE, CAPILLARY: Glucose-Capillary: 154 mg/dL — ABNORMAL HIGH (ref 65–99)

## 2016-06-23 DIAGNOSIS — S91309A Unspecified open wound, unspecified foot, initial encounter: Secondary | ICD-10-CM | POA: Diagnosis not present

## 2016-06-23 DIAGNOSIS — E1121 Type 2 diabetes mellitus with diabetic nephropathy: Secondary | ICD-10-CM | POA: Diagnosis not present

## 2016-06-23 DIAGNOSIS — N2581 Secondary hyperparathyroidism of renal origin: Secondary | ICD-10-CM | POA: Diagnosis not present

## 2016-06-23 DIAGNOSIS — N186 End stage renal disease: Secondary | ICD-10-CM | POA: Diagnosis not present

## 2016-06-25 DIAGNOSIS — S91309A Unspecified open wound, unspecified foot, initial encounter: Secondary | ICD-10-CM | POA: Diagnosis not present

## 2016-06-25 DIAGNOSIS — E1121 Type 2 diabetes mellitus with diabetic nephropathy: Secondary | ICD-10-CM | POA: Diagnosis not present

## 2016-06-25 DIAGNOSIS — N2581 Secondary hyperparathyroidism of renal origin: Secondary | ICD-10-CM | POA: Diagnosis not present

## 2016-06-25 DIAGNOSIS — Z992 Dependence on renal dialysis: Secondary | ICD-10-CM | POA: Diagnosis not present

## 2016-06-25 DIAGNOSIS — N186 End stage renal disease: Secondary | ICD-10-CM | POA: Diagnosis not present

## 2016-06-25 DIAGNOSIS — E1122 Type 2 diabetes mellitus with diabetic chronic kidney disease: Secondary | ICD-10-CM | POA: Diagnosis not present

## 2016-06-28 DIAGNOSIS — N2581 Secondary hyperparathyroidism of renal origin: Secondary | ICD-10-CM | POA: Diagnosis not present

## 2016-06-28 DIAGNOSIS — S91309A Unspecified open wound, unspecified foot, initial encounter: Secondary | ICD-10-CM | POA: Diagnosis not present

## 2016-06-28 DIAGNOSIS — N186 End stage renal disease: Secondary | ICD-10-CM | POA: Diagnosis not present

## 2016-06-30 ENCOUNTER — Encounter (HOSPITAL_BASED_OUTPATIENT_CLINIC_OR_DEPARTMENT_OTHER): Payer: Medicare Other | Attending: Surgery

## 2016-06-30 DIAGNOSIS — I509 Heart failure, unspecified: Secondary | ICD-10-CM | POA: Diagnosis not present

## 2016-06-30 DIAGNOSIS — Z992 Dependence on renal dialysis: Secondary | ICD-10-CM | POA: Insufficient documentation

## 2016-06-30 DIAGNOSIS — I132 Hypertensive heart and chronic kidney disease with heart failure and with stage 5 chronic kidney disease, or end stage renal disease: Secondary | ICD-10-CM | POA: Diagnosis not present

## 2016-06-30 DIAGNOSIS — N2581 Secondary hyperparathyroidism of renal origin: Secondary | ICD-10-CM | POA: Diagnosis not present

## 2016-06-30 DIAGNOSIS — L97511 Non-pressure chronic ulcer of other part of right foot limited to breakdown of skin: Secondary | ICD-10-CM | POA: Insufficient documentation

## 2016-06-30 DIAGNOSIS — Z8631 Personal history of diabetic foot ulcer: Secondary | ICD-10-CM | POA: Insufficient documentation

## 2016-06-30 DIAGNOSIS — L97521 Non-pressure chronic ulcer of other part of left foot limited to breakdown of skin: Secondary | ICD-10-CM | POA: Diagnosis not present

## 2016-06-30 DIAGNOSIS — E1151 Type 2 diabetes mellitus with diabetic peripheral angiopathy without gangrene: Secondary | ICD-10-CM | POA: Insufficient documentation

## 2016-06-30 DIAGNOSIS — E11621 Type 2 diabetes mellitus with foot ulcer: Secondary | ICD-10-CM | POA: Insufficient documentation

## 2016-06-30 DIAGNOSIS — E1140 Type 2 diabetes mellitus with diabetic neuropathy, unspecified: Secondary | ICD-10-CM | POA: Insufficient documentation

## 2016-06-30 DIAGNOSIS — N186 End stage renal disease: Secondary | ICD-10-CM | POA: Diagnosis not present

## 2016-06-30 DIAGNOSIS — E1122 Type 2 diabetes mellitus with diabetic chronic kidney disease: Secondary | ICD-10-CM | POA: Insufficient documentation

## 2016-06-30 DIAGNOSIS — S91309A Unspecified open wound, unspecified foot, initial encounter: Secondary | ICD-10-CM | POA: Diagnosis not present

## 2016-07-02 DIAGNOSIS — N186 End stage renal disease: Secondary | ICD-10-CM | POA: Diagnosis not present

## 2016-07-02 DIAGNOSIS — S91309A Unspecified open wound, unspecified foot, initial encounter: Secondary | ICD-10-CM | POA: Diagnosis not present

## 2016-07-02 DIAGNOSIS — N2581 Secondary hyperparathyroidism of renal origin: Secondary | ICD-10-CM | POA: Diagnosis not present

## 2016-07-05 DIAGNOSIS — N186 End stage renal disease: Secondary | ICD-10-CM | POA: Diagnosis not present

## 2016-07-05 DIAGNOSIS — N2581 Secondary hyperparathyroidism of renal origin: Secondary | ICD-10-CM | POA: Diagnosis not present

## 2016-07-05 DIAGNOSIS — S91309A Unspecified open wound, unspecified foot, initial encounter: Secondary | ICD-10-CM | POA: Diagnosis not present

## 2016-07-06 DIAGNOSIS — E1122 Type 2 diabetes mellitus with diabetic chronic kidney disease: Secondary | ICD-10-CM | POA: Diagnosis not present

## 2016-07-06 DIAGNOSIS — L97511 Non-pressure chronic ulcer of other part of right foot limited to breakdown of skin: Secondary | ICD-10-CM | POA: Diagnosis not present

## 2016-07-06 DIAGNOSIS — N186 End stage renal disease: Secondary | ICD-10-CM | POA: Diagnosis not present

## 2016-07-06 DIAGNOSIS — E11621 Type 2 diabetes mellitus with foot ulcer: Secondary | ICD-10-CM | POA: Diagnosis not present

## 2016-07-06 DIAGNOSIS — L97521 Non-pressure chronic ulcer of other part of left foot limited to breakdown of skin: Secondary | ICD-10-CM | POA: Diagnosis not present

## 2016-07-06 DIAGNOSIS — I132 Hypertensive heart and chronic kidney disease with heart failure and with stage 5 chronic kidney disease, or end stage renal disease: Secondary | ICD-10-CM | POA: Diagnosis not present

## 2016-07-07 DIAGNOSIS — N186 End stage renal disease: Secondary | ICD-10-CM | POA: Diagnosis not present

## 2016-07-07 DIAGNOSIS — S91302A Unspecified open wound, left foot, initial encounter: Secondary | ICD-10-CM | POA: Diagnosis not present

## 2016-07-07 DIAGNOSIS — L97519 Non-pressure chronic ulcer of other part of right foot with unspecified severity: Secondary | ICD-10-CM | POA: Diagnosis not present

## 2016-07-07 DIAGNOSIS — N2581 Secondary hyperparathyroidism of renal origin: Secondary | ICD-10-CM | POA: Diagnosis not present

## 2016-07-07 DIAGNOSIS — S91309A Unspecified open wound, unspecified foot, initial encounter: Secondary | ICD-10-CM | POA: Diagnosis not present

## 2016-07-09 DIAGNOSIS — S91309A Unspecified open wound, unspecified foot, initial encounter: Secondary | ICD-10-CM | POA: Diagnosis not present

## 2016-07-09 DIAGNOSIS — N2581 Secondary hyperparathyroidism of renal origin: Secondary | ICD-10-CM | POA: Diagnosis not present

## 2016-07-09 DIAGNOSIS — N186 End stage renal disease: Secondary | ICD-10-CM | POA: Diagnosis not present

## 2016-07-12 DIAGNOSIS — S91309A Unspecified open wound, unspecified foot, initial encounter: Secondary | ICD-10-CM | POA: Diagnosis not present

## 2016-07-12 DIAGNOSIS — N2581 Secondary hyperparathyroidism of renal origin: Secondary | ICD-10-CM | POA: Diagnosis not present

## 2016-07-12 DIAGNOSIS — N186 End stage renal disease: Secondary | ICD-10-CM | POA: Diagnosis not present

## 2016-07-13 DIAGNOSIS — L97522 Non-pressure chronic ulcer of other part of left foot with fat layer exposed: Secondary | ICD-10-CM | POA: Diagnosis not present

## 2016-07-13 DIAGNOSIS — I132 Hypertensive heart and chronic kidney disease with heart failure and with stage 5 chronic kidney disease, or end stage renal disease: Secondary | ICD-10-CM | POA: Diagnosis not present

## 2016-07-13 DIAGNOSIS — E11621 Type 2 diabetes mellitus with foot ulcer: Secondary | ICD-10-CM | POA: Diagnosis not present

## 2016-07-13 DIAGNOSIS — N186 End stage renal disease: Secondary | ICD-10-CM | POA: Diagnosis not present

## 2016-07-13 DIAGNOSIS — L97521 Non-pressure chronic ulcer of other part of left foot limited to breakdown of skin: Secondary | ICD-10-CM | POA: Diagnosis not present

## 2016-07-13 DIAGNOSIS — Z8631 Personal history of diabetic foot ulcer: Secondary | ICD-10-CM | POA: Diagnosis not present

## 2016-07-13 DIAGNOSIS — E1122 Type 2 diabetes mellitus with diabetic chronic kidney disease: Secondary | ICD-10-CM | POA: Diagnosis not present

## 2016-07-13 DIAGNOSIS — L97512 Non-pressure chronic ulcer of other part of right foot with fat layer exposed: Secondary | ICD-10-CM | POA: Diagnosis not present

## 2016-07-13 DIAGNOSIS — L97511 Non-pressure chronic ulcer of other part of right foot limited to breakdown of skin: Secondary | ICD-10-CM | POA: Diagnosis not present

## 2016-07-14 DIAGNOSIS — N186 End stage renal disease: Secondary | ICD-10-CM | POA: Diagnosis not present

## 2016-07-14 DIAGNOSIS — N2581 Secondary hyperparathyroidism of renal origin: Secondary | ICD-10-CM | POA: Diagnosis not present

## 2016-07-14 DIAGNOSIS — S91309A Unspecified open wound, unspecified foot, initial encounter: Secondary | ICD-10-CM | POA: Diagnosis not present

## 2016-07-16 DIAGNOSIS — N186 End stage renal disease: Secondary | ICD-10-CM | POA: Diagnosis not present

## 2016-07-16 DIAGNOSIS — S91309A Unspecified open wound, unspecified foot, initial encounter: Secondary | ICD-10-CM | POA: Diagnosis not present

## 2016-07-16 DIAGNOSIS — N2581 Secondary hyperparathyroidism of renal origin: Secondary | ICD-10-CM | POA: Diagnosis not present

## 2016-07-19 DIAGNOSIS — N186 End stage renal disease: Secondary | ICD-10-CM | POA: Diagnosis not present

## 2016-07-19 DIAGNOSIS — N2581 Secondary hyperparathyroidism of renal origin: Secondary | ICD-10-CM | POA: Diagnosis not present

## 2016-07-19 DIAGNOSIS — S91309A Unspecified open wound, unspecified foot, initial encounter: Secondary | ICD-10-CM | POA: Diagnosis not present

## 2016-07-20 DIAGNOSIS — L97511 Non-pressure chronic ulcer of other part of right foot limited to breakdown of skin: Secondary | ICD-10-CM | POA: Diagnosis not present

## 2016-07-20 DIAGNOSIS — N186 End stage renal disease: Secondary | ICD-10-CM | POA: Diagnosis not present

## 2016-07-20 DIAGNOSIS — I132 Hypertensive heart and chronic kidney disease with heart failure and with stage 5 chronic kidney disease, or end stage renal disease: Secondary | ICD-10-CM | POA: Diagnosis not present

## 2016-07-20 DIAGNOSIS — E11621 Type 2 diabetes mellitus with foot ulcer: Secondary | ICD-10-CM | POA: Diagnosis not present

## 2016-07-20 DIAGNOSIS — L97521 Non-pressure chronic ulcer of other part of left foot limited to breakdown of skin: Secondary | ICD-10-CM | POA: Diagnosis not present

## 2016-07-20 DIAGNOSIS — N185 Chronic kidney disease, stage 5: Secondary | ICD-10-CM | POA: Diagnosis not present

## 2016-07-20 DIAGNOSIS — E1122 Type 2 diabetes mellitus with diabetic chronic kidney disease: Secondary | ICD-10-CM | POA: Diagnosis not present

## 2016-07-20 DIAGNOSIS — Z992 Dependence on renal dialysis: Secondary | ICD-10-CM | POA: Diagnosis not present

## 2016-07-21 DIAGNOSIS — E1121 Type 2 diabetes mellitus with diabetic nephropathy: Secondary | ICD-10-CM | POA: Diagnosis not present

## 2016-07-21 DIAGNOSIS — S91309A Unspecified open wound, unspecified foot, initial encounter: Secondary | ICD-10-CM | POA: Diagnosis not present

## 2016-07-21 DIAGNOSIS — N2581 Secondary hyperparathyroidism of renal origin: Secondary | ICD-10-CM | POA: Diagnosis not present

## 2016-07-21 DIAGNOSIS — N186 End stage renal disease: Secondary | ICD-10-CM | POA: Diagnosis not present

## 2016-07-23 DIAGNOSIS — N186 End stage renal disease: Secondary | ICD-10-CM | POA: Diagnosis not present

## 2016-07-23 DIAGNOSIS — N2581 Secondary hyperparathyroidism of renal origin: Secondary | ICD-10-CM | POA: Diagnosis not present

## 2016-07-23 DIAGNOSIS — S91309A Unspecified open wound, unspecified foot, initial encounter: Secondary | ICD-10-CM | POA: Diagnosis not present

## 2016-07-26 DIAGNOSIS — N2581 Secondary hyperparathyroidism of renal origin: Secondary | ICD-10-CM | POA: Diagnosis not present

## 2016-07-26 DIAGNOSIS — S91309A Unspecified open wound, unspecified foot, initial encounter: Secondary | ICD-10-CM | POA: Diagnosis not present

## 2016-07-26 DIAGNOSIS — N186 End stage renal disease: Secondary | ICD-10-CM | POA: Diagnosis not present

## 2016-07-26 DIAGNOSIS — Z992 Dependence on renal dialysis: Secondary | ICD-10-CM | POA: Diagnosis not present

## 2016-07-26 DIAGNOSIS — E1122 Type 2 diabetes mellitus with diabetic chronic kidney disease: Secondary | ICD-10-CM | POA: Diagnosis not present

## 2016-07-27 ENCOUNTER — Encounter (HOSPITAL_BASED_OUTPATIENT_CLINIC_OR_DEPARTMENT_OTHER): Payer: Medicare Other | Attending: Surgery

## 2016-07-27 DIAGNOSIS — N186 End stage renal disease: Secondary | ICD-10-CM | POA: Diagnosis not present

## 2016-07-27 DIAGNOSIS — I132 Hypertensive heart and chronic kidney disease with heart failure and with stage 5 chronic kidney disease, or end stage renal disease: Secondary | ICD-10-CM | POA: Diagnosis not present

## 2016-07-27 DIAGNOSIS — Z992 Dependence on renal dialysis: Secondary | ICD-10-CM | POA: Insufficient documentation

## 2016-07-27 DIAGNOSIS — L97422 Non-pressure chronic ulcer of left heel and midfoot with fat layer exposed: Secondary | ICD-10-CM | POA: Insufficient documentation

## 2016-07-27 DIAGNOSIS — N185 Chronic kidney disease, stage 5: Secondary | ICD-10-CM | POA: Diagnosis not present

## 2016-07-27 DIAGNOSIS — E1122 Type 2 diabetes mellitus with diabetic chronic kidney disease: Secondary | ICD-10-CM | POA: Insufficient documentation

## 2016-07-27 DIAGNOSIS — E11621 Type 2 diabetes mellitus with foot ulcer: Secondary | ICD-10-CM | POA: Insufficient documentation

## 2016-07-27 DIAGNOSIS — M1990 Unspecified osteoarthritis, unspecified site: Secondary | ICD-10-CM | POA: Diagnosis not present

## 2016-07-27 DIAGNOSIS — E1140 Type 2 diabetes mellitus with diabetic neuropathy, unspecified: Secondary | ICD-10-CM | POA: Insufficient documentation

## 2016-07-27 DIAGNOSIS — I509 Heart failure, unspecified: Secondary | ICD-10-CM | POA: Diagnosis not present

## 2016-07-27 DIAGNOSIS — L97512 Non-pressure chronic ulcer of other part of right foot with fat layer exposed: Secondary | ICD-10-CM | POA: Diagnosis not present

## 2016-07-27 LAB — GLUCOSE, CAPILLARY: GLUCOSE-CAPILLARY: 195 mg/dL — AB (ref 65–99)

## 2016-07-28 DIAGNOSIS — N2581 Secondary hyperparathyroidism of renal origin: Secondary | ICD-10-CM | POA: Diagnosis not present

## 2016-07-28 DIAGNOSIS — E1121 Type 2 diabetes mellitus with diabetic nephropathy: Secondary | ICD-10-CM | POA: Diagnosis not present

## 2016-07-28 DIAGNOSIS — N186 End stage renal disease: Secondary | ICD-10-CM | POA: Diagnosis not present

## 2016-07-30 DIAGNOSIS — N186 End stage renal disease: Secondary | ICD-10-CM | POA: Diagnosis not present

## 2016-07-30 DIAGNOSIS — E1121 Type 2 diabetes mellitus with diabetic nephropathy: Secondary | ICD-10-CM | POA: Diagnosis not present

## 2016-07-30 DIAGNOSIS — N2581 Secondary hyperparathyroidism of renal origin: Secondary | ICD-10-CM | POA: Diagnosis not present

## 2016-08-02 DIAGNOSIS — N2581 Secondary hyperparathyroidism of renal origin: Secondary | ICD-10-CM | POA: Diagnosis not present

## 2016-08-02 DIAGNOSIS — N186 End stage renal disease: Secondary | ICD-10-CM | POA: Diagnosis not present

## 2016-08-02 DIAGNOSIS — E1121 Type 2 diabetes mellitus with diabetic nephropathy: Secondary | ICD-10-CM | POA: Diagnosis not present

## 2016-08-03 DIAGNOSIS — I132 Hypertensive heart and chronic kidney disease with heart failure and with stage 5 chronic kidney disease, or end stage renal disease: Secondary | ICD-10-CM | POA: Diagnosis not present

## 2016-08-03 DIAGNOSIS — N186 End stage renal disease: Secondary | ICD-10-CM | POA: Diagnosis not present

## 2016-08-03 DIAGNOSIS — E11621 Type 2 diabetes mellitus with foot ulcer: Secondary | ICD-10-CM | POA: Diagnosis not present

## 2016-08-03 DIAGNOSIS — L97521 Non-pressure chronic ulcer of other part of left foot limited to breakdown of skin: Secondary | ICD-10-CM | POA: Diagnosis not present

## 2016-08-03 DIAGNOSIS — L97512 Non-pressure chronic ulcer of other part of right foot with fat layer exposed: Secondary | ICD-10-CM | POA: Diagnosis not present

## 2016-08-03 DIAGNOSIS — I509 Heart failure, unspecified: Secondary | ICD-10-CM | POA: Diagnosis not present

## 2016-08-03 DIAGNOSIS — Z992 Dependence on renal dialysis: Secondary | ICD-10-CM | POA: Diagnosis not present

## 2016-08-03 DIAGNOSIS — I871 Compression of vein: Secondary | ICD-10-CM | POA: Diagnosis not present

## 2016-08-03 DIAGNOSIS — L97422 Non-pressure chronic ulcer of left heel and midfoot with fat layer exposed: Secondary | ICD-10-CM | POA: Diagnosis not present

## 2016-08-03 DIAGNOSIS — E1122 Type 2 diabetes mellitus with diabetic chronic kidney disease: Secondary | ICD-10-CM | POA: Diagnosis not present

## 2016-08-03 DIAGNOSIS — L97511 Non-pressure chronic ulcer of other part of right foot limited to breakdown of skin: Secondary | ICD-10-CM | POA: Diagnosis not present

## 2016-08-03 DIAGNOSIS — T82858D Stenosis of vascular prosthetic devices, implants and grafts, subsequent encounter: Secondary | ICD-10-CM | POA: Diagnosis not present

## 2016-08-04 DIAGNOSIS — E1121 Type 2 diabetes mellitus with diabetic nephropathy: Secondary | ICD-10-CM | POA: Diagnosis not present

## 2016-08-04 DIAGNOSIS — N186 End stage renal disease: Secondary | ICD-10-CM | POA: Diagnosis not present

## 2016-08-04 DIAGNOSIS — N2581 Secondary hyperparathyroidism of renal origin: Secondary | ICD-10-CM | POA: Diagnosis not present

## 2016-08-05 DIAGNOSIS — I10 Essential (primary) hypertension: Secondary | ICD-10-CM | POA: Diagnosis not present

## 2016-08-05 DIAGNOSIS — L97509 Non-pressure chronic ulcer of other part of unspecified foot with unspecified severity: Secondary | ICD-10-CM | POA: Diagnosis not present

## 2016-08-05 DIAGNOSIS — E119 Type 2 diabetes mellitus without complications: Secondary | ICD-10-CM | POA: Diagnosis not present

## 2016-08-05 DIAGNOSIS — N289 Disorder of kidney and ureter, unspecified: Secondary | ICD-10-CM | POA: Diagnosis not present

## 2016-08-06 DIAGNOSIS — N186 End stage renal disease: Secondary | ICD-10-CM | POA: Diagnosis not present

## 2016-08-06 DIAGNOSIS — E1121 Type 2 diabetes mellitus with diabetic nephropathy: Secondary | ICD-10-CM | POA: Diagnosis not present

## 2016-08-06 DIAGNOSIS — N2581 Secondary hyperparathyroidism of renal origin: Secondary | ICD-10-CM | POA: Diagnosis not present

## 2016-08-09 DIAGNOSIS — N2581 Secondary hyperparathyroidism of renal origin: Secondary | ICD-10-CM | POA: Diagnosis not present

## 2016-08-09 DIAGNOSIS — N186 End stage renal disease: Secondary | ICD-10-CM | POA: Diagnosis not present

## 2016-08-09 DIAGNOSIS — E1121 Type 2 diabetes mellitus with diabetic nephropathy: Secondary | ICD-10-CM | POA: Diagnosis not present

## 2016-08-10 DIAGNOSIS — E11621 Type 2 diabetes mellitus with foot ulcer: Secondary | ICD-10-CM | POA: Diagnosis not present

## 2016-08-10 DIAGNOSIS — L97512 Non-pressure chronic ulcer of other part of right foot with fat layer exposed: Secondary | ICD-10-CM | POA: Diagnosis not present

## 2016-08-10 DIAGNOSIS — E1122 Type 2 diabetes mellitus with diabetic chronic kidney disease: Secondary | ICD-10-CM | POA: Diagnosis not present

## 2016-08-10 DIAGNOSIS — I132 Hypertensive heart and chronic kidney disease with heart failure and with stage 5 chronic kidney disease, or end stage renal disease: Secondary | ICD-10-CM | POA: Diagnosis not present

## 2016-08-10 DIAGNOSIS — Z992 Dependence on renal dialysis: Secondary | ICD-10-CM | POA: Diagnosis not present

## 2016-08-10 DIAGNOSIS — I509 Heart failure, unspecified: Secondary | ICD-10-CM | POA: Diagnosis not present

## 2016-08-10 DIAGNOSIS — L97422 Non-pressure chronic ulcer of left heel and midfoot with fat layer exposed: Secondary | ICD-10-CM | POA: Diagnosis not present

## 2016-08-10 DIAGNOSIS — N185 Chronic kidney disease, stage 5: Secondary | ICD-10-CM | POA: Diagnosis not present

## 2016-08-10 DIAGNOSIS — N186 End stage renal disease: Secondary | ICD-10-CM | POA: Diagnosis not present

## 2016-08-11 DIAGNOSIS — N186 End stage renal disease: Secondary | ICD-10-CM | POA: Diagnosis not present

## 2016-08-11 DIAGNOSIS — N2581 Secondary hyperparathyroidism of renal origin: Secondary | ICD-10-CM | POA: Diagnosis not present

## 2016-08-11 DIAGNOSIS — E1121 Type 2 diabetes mellitus with diabetic nephropathy: Secondary | ICD-10-CM | POA: Diagnosis not present

## 2016-08-13 DIAGNOSIS — N2581 Secondary hyperparathyroidism of renal origin: Secondary | ICD-10-CM | POA: Diagnosis not present

## 2016-08-13 DIAGNOSIS — N186 End stage renal disease: Secondary | ICD-10-CM | POA: Diagnosis not present

## 2016-08-13 DIAGNOSIS — E1121 Type 2 diabetes mellitus with diabetic nephropathy: Secondary | ICD-10-CM | POA: Diagnosis not present

## 2016-08-16 DIAGNOSIS — E1121 Type 2 diabetes mellitus with diabetic nephropathy: Secondary | ICD-10-CM | POA: Diagnosis not present

## 2016-08-16 DIAGNOSIS — N186 End stage renal disease: Secondary | ICD-10-CM | POA: Diagnosis not present

## 2016-08-16 DIAGNOSIS — N2581 Secondary hyperparathyroidism of renal origin: Secondary | ICD-10-CM | POA: Diagnosis not present

## 2016-08-17 DIAGNOSIS — L97512 Non-pressure chronic ulcer of other part of right foot with fat layer exposed: Secondary | ICD-10-CM | POA: Diagnosis not present

## 2016-08-17 DIAGNOSIS — L97422 Non-pressure chronic ulcer of left heel and midfoot with fat layer exposed: Secondary | ICD-10-CM | POA: Diagnosis not present

## 2016-08-17 DIAGNOSIS — I509 Heart failure, unspecified: Secondary | ICD-10-CM | POA: Diagnosis not present

## 2016-08-17 DIAGNOSIS — E11621 Type 2 diabetes mellitus with foot ulcer: Secondary | ICD-10-CM | POA: Diagnosis not present

## 2016-08-17 DIAGNOSIS — N185 Chronic kidney disease, stage 5: Secondary | ICD-10-CM | POA: Diagnosis not present

## 2016-08-17 DIAGNOSIS — I132 Hypertensive heart and chronic kidney disease with heart failure and with stage 5 chronic kidney disease, or end stage renal disease: Secondary | ICD-10-CM | POA: Diagnosis not present

## 2016-08-17 DIAGNOSIS — Z992 Dependence on renal dialysis: Secondary | ICD-10-CM | POA: Diagnosis not present

## 2016-08-17 DIAGNOSIS — E1122 Type 2 diabetes mellitus with diabetic chronic kidney disease: Secondary | ICD-10-CM | POA: Diagnosis not present

## 2016-08-17 DIAGNOSIS — N186 End stage renal disease: Secondary | ICD-10-CM | POA: Diagnosis not present

## 2016-08-18 DIAGNOSIS — N2581 Secondary hyperparathyroidism of renal origin: Secondary | ICD-10-CM | POA: Diagnosis not present

## 2016-08-18 DIAGNOSIS — N186 End stage renal disease: Secondary | ICD-10-CM | POA: Diagnosis not present

## 2016-08-18 DIAGNOSIS — E1121 Type 2 diabetes mellitus with diabetic nephropathy: Secondary | ICD-10-CM | POA: Diagnosis not present

## 2016-08-20 DIAGNOSIS — N2581 Secondary hyperparathyroidism of renal origin: Secondary | ICD-10-CM | POA: Diagnosis not present

## 2016-08-20 DIAGNOSIS — E1121 Type 2 diabetes mellitus with diabetic nephropathy: Secondary | ICD-10-CM | POA: Diagnosis not present

## 2016-08-20 DIAGNOSIS — N186 End stage renal disease: Secondary | ICD-10-CM | POA: Diagnosis not present

## 2016-08-23 DIAGNOSIS — N2581 Secondary hyperparathyroidism of renal origin: Secondary | ICD-10-CM | POA: Diagnosis not present

## 2016-08-23 DIAGNOSIS — E1121 Type 2 diabetes mellitus with diabetic nephropathy: Secondary | ICD-10-CM | POA: Diagnosis not present

## 2016-08-23 DIAGNOSIS — N186 End stage renal disease: Secondary | ICD-10-CM | POA: Diagnosis not present

## 2016-08-24 DIAGNOSIS — E11621 Type 2 diabetes mellitus with foot ulcer: Secondary | ICD-10-CM | POA: Diagnosis not present

## 2016-08-24 DIAGNOSIS — I132 Hypertensive heart and chronic kidney disease with heart failure and with stage 5 chronic kidney disease, or end stage renal disease: Secondary | ICD-10-CM | POA: Diagnosis not present

## 2016-08-24 DIAGNOSIS — N186 End stage renal disease: Secondary | ICD-10-CM | POA: Diagnosis not present

## 2016-08-24 DIAGNOSIS — L97422 Non-pressure chronic ulcer of left heel and midfoot with fat layer exposed: Secondary | ICD-10-CM | POA: Diagnosis not present

## 2016-08-24 DIAGNOSIS — E1122 Type 2 diabetes mellitus with diabetic chronic kidney disease: Secondary | ICD-10-CM | POA: Diagnosis not present

## 2016-08-24 DIAGNOSIS — I509 Heart failure, unspecified: Secondary | ICD-10-CM | POA: Diagnosis not present

## 2016-08-24 DIAGNOSIS — N185 Chronic kidney disease, stage 5: Secondary | ICD-10-CM | POA: Diagnosis not present

## 2016-08-24 DIAGNOSIS — L97512 Non-pressure chronic ulcer of other part of right foot with fat layer exposed: Secondary | ICD-10-CM | POA: Diagnosis not present

## 2016-08-25 DIAGNOSIS — E1121 Type 2 diabetes mellitus with diabetic nephropathy: Secondary | ICD-10-CM | POA: Diagnosis not present

## 2016-08-25 DIAGNOSIS — N186 End stage renal disease: Secondary | ICD-10-CM | POA: Diagnosis not present

## 2016-08-25 DIAGNOSIS — N2581 Secondary hyperparathyroidism of renal origin: Secondary | ICD-10-CM | POA: Diagnosis not present

## 2016-08-26 DIAGNOSIS — Z992 Dependence on renal dialysis: Secondary | ICD-10-CM | POA: Diagnosis not present

## 2016-08-26 DIAGNOSIS — N186 End stage renal disease: Secondary | ICD-10-CM | POA: Diagnosis not present

## 2016-08-26 DIAGNOSIS — E1122 Type 2 diabetes mellitus with diabetic chronic kidney disease: Secondary | ICD-10-CM | POA: Diagnosis not present

## 2016-08-27 DIAGNOSIS — N2581 Secondary hyperparathyroidism of renal origin: Secondary | ICD-10-CM | POA: Diagnosis not present

## 2016-08-27 DIAGNOSIS — S91309A Unspecified open wound, unspecified foot, initial encounter: Secondary | ICD-10-CM | POA: Diagnosis not present

## 2016-08-27 DIAGNOSIS — Z23 Encounter for immunization: Secondary | ICD-10-CM | POA: Diagnosis not present

## 2016-08-27 DIAGNOSIS — N186 End stage renal disease: Secondary | ICD-10-CM | POA: Diagnosis not present

## 2016-08-27 DIAGNOSIS — E1121 Type 2 diabetes mellitus with diabetic nephropathy: Secondary | ICD-10-CM | POA: Diagnosis not present

## 2016-08-30 DIAGNOSIS — E1121 Type 2 diabetes mellitus with diabetic nephropathy: Secondary | ICD-10-CM | POA: Diagnosis not present

## 2016-08-30 DIAGNOSIS — N2581 Secondary hyperparathyroidism of renal origin: Secondary | ICD-10-CM | POA: Diagnosis not present

## 2016-08-30 DIAGNOSIS — Z23 Encounter for immunization: Secondary | ICD-10-CM | POA: Diagnosis not present

## 2016-08-30 DIAGNOSIS — N186 End stage renal disease: Secondary | ICD-10-CM | POA: Diagnosis not present

## 2016-08-30 DIAGNOSIS — S91309A Unspecified open wound, unspecified foot, initial encounter: Secondary | ICD-10-CM | POA: Diagnosis not present

## 2016-08-31 ENCOUNTER — Encounter (HOSPITAL_BASED_OUTPATIENT_CLINIC_OR_DEPARTMENT_OTHER): Payer: Medicare Other | Attending: Surgery

## 2016-08-31 DIAGNOSIS — M86371 Chronic multifocal osteomyelitis, right ankle and foot: Secondary | ICD-10-CM | POA: Insufficient documentation

## 2016-08-31 DIAGNOSIS — E11621 Type 2 diabetes mellitus with foot ulcer: Secondary | ICD-10-CM | POA: Diagnosis not present

## 2016-08-31 DIAGNOSIS — E1151 Type 2 diabetes mellitus with diabetic peripheral angiopathy without gangrene: Secondary | ICD-10-CM | POA: Insufficient documentation

## 2016-08-31 DIAGNOSIS — L84 Corns and callosities: Secondary | ICD-10-CM | POA: Insufficient documentation

## 2016-08-31 DIAGNOSIS — I509 Heart failure, unspecified: Secondary | ICD-10-CM | POA: Insufficient documentation

## 2016-08-31 DIAGNOSIS — N185 Chronic kidney disease, stage 5: Secondary | ICD-10-CM | POA: Diagnosis not present

## 2016-08-31 DIAGNOSIS — I132 Hypertensive heart and chronic kidney disease with heart failure and with stage 5 chronic kidney disease, or end stage renal disease: Secondary | ICD-10-CM | POA: Insufficient documentation

## 2016-08-31 DIAGNOSIS — E1122 Type 2 diabetes mellitus with diabetic chronic kidney disease: Secondary | ICD-10-CM | POA: Insufficient documentation

## 2016-08-31 DIAGNOSIS — Z992 Dependence on renal dialysis: Secondary | ICD-10-CM | POA: Insufficient documentation

## 2016-08-31 DIAGNOSIS — L97512 Non-pressure chronic ulcer of other part of right foot with fat layer exposed: Secondary | ICD-10-CM | POA: Diagnosis not present

## 2016-08-31 DIAGNOSIS — L97414 Non-pressure chronic ulcer of right heel and midfoot with necrosis of bone: Secondary | ICD-10-CM | POA: Diagnosis not present

## 2016-08-31 DIAGNOSIS — E1140 Type 2 diabetes mellitus with diabetic neuropathy, unspecified: Secondary | ICD-10-CM | POA: Diagnosis not present

## 2016-09-01 DIAGNOSIS — E1121 Type 2 diabetes mellitus with diabetic nephropathy: Secondary | ICD-10-CM | POA: Diagnosis not present

## 2016-09-01 DIAGNOSIS — N186 End stage renal disease: Secondary | ICD-10-CM | POA: Diagnosis not present

## 2016-09-01 DIAGNOSIS — S91309A Unspecified open wound, unspecified foot, initial encounter: Secondary | ICD-10-CM | POA: Diagnosis not present

## 2016-09-01 DIAGNOSIS — N2581 Secondary hyperparathyroidism of renal origin: Secondary | ICD-10-CM | POA: Diagnosis not present

## 2016-09-01 DIAGNOSIS — Z23 Encounter for immunization: Secondary | ICD-10-CM | POA: Diagnosis not present

## 2016-09-03 DIAGNOSIS — N2581 Secondary hyperparathyroidism of renal origin: Secondary | ICD-10-CM | POA: Diagnosis not present

## 2016-09-03 DIAGNOSIS — Z23 Encounter for immunization: Secondary | ICD-10-CM | POA: Diagnosis not present

## 2016-09-03 DIAGNOSIS — E1121 Type 2 diabetes mellitus with diabetic nephropathy: Secondary | ICD-10-CM | POA: Diagnosis not present

## 2016-09-03 DIAGNOSIS — N186 End stage renal disease: Secondary | ICD-10-CM | POA: Diagnosis not present

## 2016-09-03 DIAGNOSIS — S91309A Unspecified open wound, unspecified foot, initial encounter: Secondary | ICD-10-CM | POA: Diagnosis not present

## 2016-09-06 DIAGNOSIS — S91309A Unspecified open wound, unspecified foot, initial encounter: Secondary | ICD-10-CM | POA: Diagnosis not present

## 2016-09-06 DIAGNOSIS — N186 End stage renal disease: Secondary | ICD-10-CM | POA: Diagnosis not present

## 2016-09-06 DIAGNOSIS — Z23 Encounter for immunization: Secondary | ICD-10-CM | POA: Diagnosis not present

## 2016-09-06 DIAGNOSIS — N2581 Secondary hyperparathyroidism of renal origin: Secondary | ICD-10-CM | POA: Diagnosis not present

## 2016-09-06 DIAGNOSIS — E1121 Type 2 diabetes mellitus with diabetic nephropathy: Secondary | ICD-10-CM | POA: Diagnosis not present

## 2016-09-07 ENCOUNTER — Other Ambulatory Visit: Payer: Self-pay | Admitting: Surgery

## 2016-09-07 DIAGNOSIS — I132 Hypertensive heart and chronic kidney disease with heart failure and with stage 5 chronic kidney disease, or end stage renal disease: Secondary | ICD-10-CM | POA: Diagnosis not present

## 2016-09-07 DIAGNOSIS — N185 Chronic kidney disease, stage 5: Secondary | ICD-10-CM | POA: Diagnosis not present

## 2016-09-07 DIAGNOSIS — E1122 Type 2 diabetes mellitus with diabetic chronic kidney disease: Secondary | ICD-10-CM | POA: Diagnosis not present

## 2016-09-07 DIAGNOSIS — E11621 Type 2 diabetes mellitus with foot ulcer: Secondary | ICD-10-CM | POA: Diagnosis not present

## 2016-09-07 DIAGNOSIS — L97512 Non-pressure chronic ulcer of other part of right foot with fat layer exposed: Secondary | ICD-10-CM | POA: Diagnosis not present

## 2016-09-07 DIAGNOSIS — L97514 Non-pressure chronic ulcer of other part of right foot with necrosis of bone: Secondary | ICD-10-CM | POA: Diagnosis not present

## 2016-09-07 DIAGNOSIS — I509 Heart failure, unspecified: Secondary | ICD-10-CM | POA: Diagnosis not present

## 2016-09-07 DIAGNOSIS — Z992 Dependence on renal dialysis: Secondary | ICD-10-CM | POA: Diagnosis not present

## 2016-09-08 DIAGNOSIS — Z23 Encounter for immunization: Secondary | ICD-10-CM | POA: Diagnosis not present

## 2016-09-08 DIAGNOSIS — N2581 Secondary hyperparathyroidism of renal origin: Secondary | ICD-10-CM | POA: Diagnosis not present

## 2016-09-08 DIAGNOSIS — S91309A Unspecified open wound, unspecified foot, initial encounter: Secondary | ICD-10-CM | POA: Diagnosis not present

## 2016-09-08 DIAGNOSIS — N186 End stage renal disease: Secondary | ICD-10-CM | POA: Diagnosis not present

## 2016-09-08 DIAGNOSIS — E1121 Type 2 diabetes mellitus with diabetic nephropathy: Secondary | ICD-10-CM | POA: Diagnosis not present

## 2016-09-11 ENCOUNTER — Encounter (HOSPITAL_COMMUNITY): Payer: Self-pay | Admitting: Emergency Medicine

## 2016-09-11 ENCOUNTER — Emergency Department (HOSPITAL_COMMUNITY): Payer: Medicare Other

## 2016-09-11 ENCOUNTER — Inpatient Hospital Stay (HOSPITAL_COMMUNITY)
Admission: EM | Admit: 2016-09-11 | Discharge: 2016-09-14 | DRG: 638 | Disposition: A | Discharge status: Home / Self Care | Payer: Medicare Other | Attending: Family Medicine | Admitting: Family Medicine

## 2016-09-11 DIAGNOSIS — I5022 Chronic systolic (congestive) heart failure: Secondary | ICD-10-CM | POA: Diagnosis present

## 2016-09-11 DIAGNOSIS — I132 Hypertensive heart and chronic kidney disease with heart failure and with stage 5 chronic kidney disease, or end stage renal disease: Secondary | ICD-10-CM | POA: Diagnosis present

## 2016-09-11 DIAGNOSIS — I739 Peripheral vascular disease, unspecified: Secondary | ICD-10-CM | POA: Diagnosis present

## 2016-09-11 DIAGNOSIS — I248 Other forms of acute ischemic heart disease: Secondary | ICD-10-CM | POA: Diagnosis present

## 2016-09-11 DIAGNOSIS — Z87891 Personal history of nicotine dependence: Secondary | ICD-10-CM | POA: Diagnosis not present

## 2016-09-11 DIAGNOSIS — S91301A Unspecified open wound, right foot, initial encounter: Secondary | ICD-10-CM | POA: Diagnosis not present

## 2016-09-11 DIAGNOSIS — Z992 Dependence on renal dialysis: Secondary | ICD-10-CM | POA: Diagnosis not present

## 2016-09-11 DIAGNOSIS — E11621 Type 2 diabetes mellitus with foot ulcer: Secondary | ICD-10-CM | POA: Diagnosis present

## 2016-09-11 DIAGNOSIS — E1121 Type 2 diabetes mellitus with diabetic nephropathy: Secondary | ICD-10-CM | POA: Diagnosis present

## 2016-09-11 DIAGNOSIS — D72819 Decreased white blood cell count, unspecified: Secondary | ICD-10-CM | POA: Diagnosis not present

## 2016-09-11 DIAGNOSIS — Z79891 Long term (current) use of opiate analgesic: Secondary | ICD-10-CM

## 2016-09-11 DIAGNOSIS — Z79899 Other long term (current) drug therapy: Secondary | ICD-10-CM

## 2016-09-11 DIAGNOSIS — E1151 Type 2 diabetes mellitus with diabetic peripheral angiopathy without gangrene: Secondary | ICD-10-CM | POA: Diagnosis present

## 2016-09-11 DIAGNOSIS — Z8631 Personal history of diabetic foot ulcer: Secondary | ICD-10-CM

## 2016-09-11 DIAGNOSIS — E1169 Type 2 diabetes mellitus with other specified complication: Principal | ICD-10-CM | POA: Diagnosis present

## 2016-09-11 DIAGNOSIS — N2581 Secondary hyperparathyroidism of renal origin: Secondary | ICD-10-CM | POA: Diagnosis not present

## 2016-09-11 DIAGNOSIS — M86171 Other acute osteomyelitis, right ankle and foot: Secondary | ICD-10-CM | POA: Diagnosis present

## 2016-09-11 DIAGNOSIS — N186 End stage renal disease: Secondary | ICD-10-CM | POA: Diagnosis present

## 2016-09-11 DIAGNOSIS — Z89421 Acquired absence of other right toe(s): Secondary | ICD-10-CM

## 2016-09-11 DIAGNOSIS — I420 Dilated cardiomyopathy: Secondary | ICD-10-CM | POA: Diagnosis present

## 2016-09-11 DIAGNOSIS — Z794 Long term (current) use of insulin: Secondary | ICD-10-CM

## 2016-09-11 DIAGNOSIS — E1142 Type 2 diabetes mellitus with diabetic polyneuropathy: Secondary | ICD-10-CM | POA: Diagnosis present

## 2016-09-11 DIAGNOSIS — M869 Osteomyelitis, unspecified: Secondary | ICD-10-CM | POA: Insufficient documentation

## 2016-09-11 DIAGNOSIS — I120 Hypertensive chronic kidney disease with stage 5 chronic kidney disease or end stage renal disease: Secondary | ICD-10-CM | POA: Diagnosis not present

## 2016-09-11 DIAGNOSIS — E1122 Type 2 diabetes mellitus with diabetic chronic kidney disease: Secondary | ICD-10-CM | POA: Diagnosis present

## 2016-09-11 DIAGNOSIS — E1129 Type 2 diabetes mellitus with other diabetic kidney complication: Secondary | ICD-10-CM | POA: Diagnosis not present

## 2016-09-11 DIAGNOSIS — Z833 Family history of diabetes mellitus: Secondary | ICD-10-CM

## 2016-09-11 DIAGNOSIS — D631 Anemia in chronic kidney disease: Secondary | ICD-10-CM | POA: Diagnosis present

## 2016-09-11 DIAGNOSIS — Z8249 Family history of ischemic heart disease and other diseases of the circulatory system: Secondary | ICD-10-CM | POA: Diagnosis not present

## 2016-09-11 DIAGNOSIS — R509 Fever, unspecified: Secondary | ICD-10-CM | POA: Diagnosis present

## 2016-09-11 DIAGNOSIS — R079 Chest pain, unspecified: Secondary | ICD-10-CM | POA: Diagnosis not present

## 2016-09-11 DIAGNOSIS — Z23 Encounter for immunization: Secondary | ICD-10-CM | POA: Diagnosis not present

## 2016-09-11 DIAGNOSIS — Z880 Allergy status to penicillin: Secondary | ICD-10-CM

## 2016-09-11 DIAGNOSIS — L97519 Non-pressure chronic ulcer of other part of right foot with unspecified severity: Secondary | ICD-10-CM | POA: Diagnosis present

## 2016-09-11 DIAGNOSIS — E213 Hyperparathyroidism, unspecified: Secondary | ICD-10-CM | POA: Diagnosis present

## 2016-09-11 DIAGNOSIS — S91309A Unspecified open wound, unspecified foot, initial encounter: Secondary | ICD-10-CM | POA: Diagnosis not present

## 2016-09-11 LAB — URINALYSIS, ROUTINE W REFLEX MICROSCOPIC
Glucose, UA: 100 mg/dL — AB
Hgb urine dipstick: NEGATIVE
KETONES UR: NEGATIVE mg/dL
LEUKOCYTES UA: NEGATIVE
NITRITE: NEGATIVE
PROTEIN: 100 mg/dL — AB
Specific Gravity, Urine: 1.015 (ref 1.005–1.030)
pH: 8.5 — ABNORMAL HIGH (ref 5.0–8.0)

## 2016-09-11 LAB — CBC
HCT: 38.5 % — ABNORMAL LOW (ref 39.0–52.0)
HEMOGLOBIN: 12.1 g/dL — AB (ref 13.0–17.0)
MCH: 30.7 pg (ref 26.0–34.0)
MCHC: 31.4 g/dL (ref 30.0–36.0)
MCV: 97.7 fL (ref 78.0–100.0)
PLATELETS: 261 10*3/uL (ref 150–400)
RBC: 3.94 MIL/uL — AB (ref 4.22–5.81)
RDW: 14.8 % (ref 11.5–15.5)
WBC: 5.6 10*3/uL (ref 4.0–10.5)

## 2016-09-11 LAB — COMPREHENSIVE METABOLIC PANEL
ALK PHOS: 56 U/L (ref 38–126)
ALT: 13 U/L — ABNORMAL LOW (ref 17–63)
ANION GAP: 13 (ref 5–15)
AST: 18 U/L (ref 15–41)
Albumin: 3.6 g/dL (ref 3.5–5.0)
BILIRUBIN TOTAL: 0.5 mg/dL (ref 0.3–1.2)
BUN: 21 mg/dL — ABNORMAL HIGH (ref 6–20)
CALCIUM: 8.7 mg/dL — AB (ref 8.9–10.3)
CO2: 29 mmol/L (ref 22–32)
Chloride: 93 mmol/L — ABNORMAL LOW (ref 101–111)
Creatinine, Ser: 8.24 mg/dL — ABNORMAL HIGH (ref 0.61–1.24)
GFR, EST AFRICAN AMERICAN: 7 mL/min — AB (ref >60)
GFR, EST NON AFRICAN AMERICAN: 6 mL/min — AB (ref >60)
Glucose, Bld: 208 mg/dL — ABNORMAL HIGH (ref 65–99)
Potassium: 4.2 mmol/L (ref 3.5–5.1)
SODIUM: 135 mmol/L (ref 135–145)
TOTAL PROTEIN: 8.6 g/dL — AB (ref 6.5–8.1)

## 2016-09-11 LAB — GLUCOSE, CAPILLARY: GLUCOSE-CAPILLARY: 168 mg/dL — AB (ref 65–99)

## 2016-09-11 LAB — SEDIMENTATION RATE: SED RATE: 70 mm/h — AB (ref 0–16)

## 2016-09-11 LAB — CREATININE, SERUM
CREATININE: 9.49 mg/dL — AB (ref 0.61–1.24)
GFR calc Af Amer: 6 mL/min — ABNORMAL LOW (ref >60)
GFR, EST NON AFRICAN AMERICAN: 5 mL/min — AB (ref >60)

## 2016-09-11 LAB — I-STAT CHEM 8, ED
BUN: 32 mg/dL — ABNORMAL HIGH (ref 6–20)
CALCIUM ION: 0.89 mmol/L — AB (ref 1.15–1.40)
CHLORIDE: 95 mmol/L — AB (ref 101–111)
Creatinine, Ser: 8.5 mg/dL — ABNORMAL HIGH (ref 0.61–1.24)
GLUCOSE: 162 mg/dL — AB (ref 65–99)
HEMATOCRIT: 42 % (ref 39.0–52.0)
HEMOGLOBIN: 14.3 g/dL (ref 13.0–17.0)
Potassium: 6.4 mmol/L (ref 3.5–5.1)
SODIUM: 135 mmol/L (ref 135–145)
TCO2: 31 mmol/L (ref 0–100)

## 2016-09-11 LAB — I-STAT CG4 LACTIC ACID, ED
LACTIC ACID, VENOUS: 1.33 mmol/L (ref 0.5–1.9)
LACTIC ACID, VENOUS: 1.01 mmol/L (ref 0.5–1.9)

## 2016-09-11 LAB — CBC WITH DIFFERENTIAL/PLATELET
BASOS ABS: 0 10*3/uL (ref 0.0–0.1)
Basophils Relative: 0 %
EOS ABS: 0.1 10*3/uL (ref 0.0–0.7)
Eosinophils Relative: 2 %
HCT: 40.8 % (ref 39.0–52.0)
HEMOGLOBIN: 13.3 g/dL (ref 13.0–17.0)
LYMPHS PCT: 21 %
Lymphs Abs: 0.8 10*3/uL (ref 0.7–4.0)
MCH: 31.6 pg (ref 26.0–34.0)
MCHC: 32.6 g/dL (ref 30.0–36.0)
MCV: 96.9 fL (ref 78.0–100.0)
MONO ABS: 0.4 10*3/uL (ref 0.1–1.0)
Monocytes Relative: 10 %
NEUTROS ABS: 2.3 10*3/uL (ref 1.7–7.7)
NEUTROS PCT: 67 %
PLATELETS: 289 10*3/uL (ref 150–400)
RBC: 4.21 MIL/uL — ABNORMAL LOW (ref 4.22–5.81)
RDW: 15 % (ref 11.5–15.5)
WBC: 3.6 10*3/uL — ABNORMAL LOW (ref 4.0–10.5)

## 2016-09-11 LAB — URINE MICROSCOPIC-ADD ON

## 2016-09-11 LAB — I-STAT TROPONIN, ED: TROPONIN I, POC: 0.1 ng/mL — AB (ref 0.00–0.08)

## 2016-09-11 MED ORDER — ACETAMINOPHEN 325 MG PO TABS
650.0000 mg | ORAL_TABLET | Freq: Four times a day (QID) | ORAL | Status: DC | PRN
Start: 2016-09-11 — End: 2016-09-14

## 2016-09-11 MED ORDER — POLYETHYLENE GLYCOL 3350 17 G PO PACK: 17.0000 g | PACK | Freq: Every day | ORAL | Status: DC | PRN

## 2016-09-11 MED ORDER — INSULIN ASPART 100 UNIT/ML ~~LOC~~ SOLN
3.0000 [IU] | Freq: Three times a day (TID) | SUBCUTANEOUS | Status: DC
  Administered 2016-09-12 – 2016-09-14 (×2): 3 [IU] via SUBCUTANEOUS

## 2016-09-11 MED ORDER — CINACALCET HCL 30 MG PO TABS
30.0000 mg | ORAL_TABLET | Freq: Every day | ORAL | Status: DC
Start: 2016-09-12 — End: 2016-09-12
  Administered 2016-09-12: 30 mg via ORAL
  Filled 2016-09-11 (×2): qty 1

## 2016-09-11 MED ORDER — CARVEDILOL 12.5 MG PO TABS
12.5000 mg | ORAL_TABLET | Freq: Two times a day (BID) | ORAL | Status: DC
  Filled 2016-09-11: qty 1

## 2016-09-11 MED ORDER — ACETAMINOPHEN 650 MG RE SUPP: 650.0000 mg | Freq: Four times a day (QID) | RECTAL | Status: DC | PRN

## 2016-09-11 MED ORDER — SODIUM CHLORIDE 0.9 % IV SOLN: 250.0000 mL | INTRAVENOUS | Status: DC | PRN

## 2016-09-11 MED ORDER — SODIUM CHLORIDE 0.9 % IV SOLN
INTRAVENOUS | Status: AC
  Administered 2016-09-11: 23:00:00 via INTRAVENOUS

## 2016-09-11 MED ORDER — CEFEPIME HCL 2 G IJ SOLR
2.0000 g | Freq: Once | INTRAMUSCULAR | Status: AC
  Administered 2016-09-11: 2 g via INTRAVENOUS
  Filled 2016-09-11: qty 2

## 2016-09-11 MED ORDER — ACETAMINOPHEN 325 MG PO TABS: 650.0000 mg | ORAL_TABLET | Freq: Four times a day (QID) | ORAL | Status: DC | PRN

## 2016-09-11 MED ORDER — SODIUM CHLORIDE 0.9% FLUSH: 3.0000 mL | INTRAVENOUS | Status: DC | PRN

## 2016-09-11 MED ORDER — ONDANSETRON HCL 4 MG/2ML IJ SOLN
4.0000 mg | Freq: Four times a day (QID) | INTRAMUSCULAR | Status: DC | PRN
  Administered 2016-09-12: 4 mg via INTRAVENOUS
  Filled 2016-09-11: qty 2

## 2016-09-11 MED ORDER — ONDANSETRON HCL 4 MG PO TABS
4.0000 mg | ORAL_TABLET | Freq: Four times a day (QID) | ORAL | Status: DC | PRN
  Administered 2016-09-11 – 2016-09-12 (×2): 4 mg via ORAL
  Filled 2016-09-11 (×2): qty 1

## 2016-09-11 MED ORDER — ACETAMINOPHEN 500 MG PO TABS
1000.0000 mg | ORAL_TABLET | Freq: Once | ORAL | Status: AC
  Administered 2016-09-11: 1000 mg via ORAL
  Filled 2016-09-11: qty 2

## 2016-09-11 MED ORDER — HYDROCODONE-ACETAMINOPHEN 5-325 MG PO TABS
1.0000 | ORAL_TABLET | Freq: Four times a day (QID) | ORAL | Status: DC | PRN
  Administered 2016-09-11 – 2016-09-12 (×3): 2 via ORAL
  Filled 2016-09-11 (×3): qty 2

## 2016-09-11 MED ORDER — INSULIN ASPART 100 UNIT/ML ~~LOC~~ SOLN: 0.0000 [IU] | Freq: Every day | SUBCUTANEOUS | Status: DC

## 2016-09-11 MED ORDER — SODIUM CHLORIDE 0.9% FLUSH
3.0000 mL | Freq: Two times a day (BID) | INTRAVENOUS | Status: DC
  Administered 2016-09-11 – 2016-09-13 (×4): 3 mL via INTRAVENOUS

## 2016-09-11 MED ORDER — INSULIN ASPART 100 UNIT/ML ~~LOC~~ SOLN
0.0000 [IU] | Freq: Three times a day (TID) | SUBCUTANEOUS | Status: DC
  Administered 2016-09-13 (×2): 2 [IU] via SUBCUTANEOUS
  Administered 2016-09-14 (×2): 1 [IU] via SUBCUTANEOUS

## 2016-09-11 MED ORDER — HEPARIN SODIUM (PORCINE) 5000 UNIT/ML IJ SOLN
5000.0000 [IU] | Freq: Three times a day (TID) | INTRAMUSCULAR | Status: DC
  Administered 2016-09-12 – 2016-09-14 (×6): 5000 [IU] via SUBCUTANEOUS
  Filled 2016-09-11 (×6): qty 1

## 2016-09-11 MED ORDER — LANTHANUM CARBONATE 500 MG PO CHEW
1000.0000 mg | CHEWABLE_TABLET | Freq: Three times a day (TID) | ORAL | Status: DC
  Filled 2016-09-11: qty 2

## 2016-09-11 MED ORDER — VITAMIN D (ERGOCALCIFEROL) 1.25 MG (50000 UNIT) PO CAPS: 50000.0000 [IU] | ORAL_CAPSULE | ORAL | Status: DC

## 2016-09-11 MED ORDER — SODIUM CHLORIDE 0.9 % IV SOLN
1500.0000 mg | Freq: Once | INTRAVENOUS | Status: AC
  Administered 2016-09-11: 1500 mg via INTRAVENOUS
  Filled 2016-09-11: qty 1500

## 2016-09-11 NOTE — H&P (Signed)
History and Physical    [name] GUILE Sr. ZTI:[id number] DOB: 05-10-1951 DOA: 09/11/2016  PCP: [name], MD  Patient coming from: HD center  Chief Complaint: Fever and weakness  HPI: [name] KOVALESKI Sr. is a 65 y.o. male with medical history significant of diabetes mellitus with peripheral neuropathy with a chronic diabetic foot ulcer a hemoglobin A1c of 5.7 back in 2015, with multiple amputations of toes in the last years, chronic kidney disease on dialysis Monday Wednesday and Friday, iron deficiency anemia but misses dialysis on Friday as he was not feeling well, he relates that during dialysis especially during this last one he was becoming hypotensive so he was sent to the ED. He relates that he's been having a low-grade fever that started about 4-5 days prior to admission. He has been feeling generally weak and anorexic. He relates he's had a right foot ulcer for more than 6 weeks has been treated with antibiotic empirically several times lately it has had any increase in drainage he relates is not foul-smelling. He's been having trouble getting out of bed due to his weakness. He also relates some left-sided chest pain with cough that has been going on for 2 weeks.  ED Course: In the ED a basic metabolic panel was done the CBC shows mild leukopenia differential is pending, chest x-ray shows no infiltrates right foot x-ray Did not show osteomyelitis, date the ED started him empirically on IV vancomycin and cefepime.  Review of Systems: As per HPI otherwise 10 point review of systems negative.    Past Medical History:  Diagnosis Date  . Cardiomyopathy ([place])   . Diabetes mellitus   . Diabetic foot ulcer ([place])   . Diabetic peripheral neuropathy ([place])   . ESRD (end stage renal disease) ([place])   . HTN (hypertension)   . Osteomyelitis ([place])   . Peripheral vascular disease ([place])    critical limb ischemia    Past Surgical History:  Procedure Laterality Date  . AV FISTULA  PLACEMENT Left 11/27/2014   Procedure: ARTERIOVENOUS (AV) FISTULA CREATION;  Surgeon: Conrad Salesville, MD;  Location: [facility];  Service: Vascular;  Laterality: Left;  . CHOLECYSTECTOMY    . COLONOSCOPY N/A 11/28/2014   Procedure: COLONOSCOPY;  Surgeon: Ladene Artist, MD;  Location[facility] ENDOSCOPY;  Service: Endoscopy;  Laterality: N/A;  . ESOPHAGOGASTRODUODENOSCOPY N/A 11/28/2014   Procedure: ESOPHAGOGASTRODUODENOSCOPY (EGD);  Surgeon: Ladene Artist, MD;  Location: [facility] ENDOSCOPY;  Service: Endoscopy;  Laterality: N/A;  . I&D EXTREMITY  01/28/2012   Procedure: IRRIGATION AND DEBRIDEMENT EXTREMITY;  Surgeon: Paulene Floor, MD;  Location: [facility] ORS;  Service: Orthopedics;  Laterality: Left;  irrigation and drainage left hand  . I&D EXTREMITY  01/30/2012   Procedure: IRRIGATION AND DEBRIDEMENT EXTREMITY;  Surgeon: Paulene Floor, MD;  Location: [facility] ORS;  Service: Orthopedics;  Laterality: Left;  flexor teno synovectomy and extensor tenosynovectomy arthrosynovectomy wristjoint  . I&D EXTREMITY  02/01/2012   Procedure: IRRIGATION AND DEBRIDEMENT EXTREMITY;  Surgeon: Paulene Floor, MD;  Location: [facility] ORS;  Service: Orthopedics;  Laterality: Left;  Left Wrist, Hand and Forearm  . Left 4th toe amputation    . LEFT AND RIGHT HEART CATHETERIZATION WITH CORONARY ANGIOGRAM N/A 11/26/2014   Procedure: LEFT AND RIGHT HEART CATHETERIZATION WITH CORONARY ANGIOGRAM;  Surgeon: Leonie Man, MD;  Location: [facility] CATH LAB;  Service: Cardiovascular;  Laterality: N/A;  . TONSILLECTOMY       reports that he has quit smoking.  His smoking use included Cigarettes. He has a 2.00 pack-year smoking history. He has never used smokeless tobacco. He reports that he does not drink alcohol or use drugs.  Allergies  Allergen Reactions  . Penicillins Hives and Swelling    Has patient had a PCN reaction causing immediate rash, facial/tongue/throat swelling, SOB or lightheadedness with hypotension: no- ankle swelling Has patient  had a PCN reaction causing severe rash involving mucus membranes or skin necrosis: no Has patient had a PCN reaction that required hospitalization: no Has patient had a PCN reaction occurring within the last 10 years: no If all of the above answers are "NO", then may proceed with Cephalosporin use.    Family History  Problem Relation Age of Onset  . Heart disease Mother   . Diabetes Father   . Diabetes Brother   . Diabetes Sister   . Diabetes Brother   . Diabetes Brother     Prior to Admission medications   Medication Sig Start Date End Date Taking? Authorizing Provider  acetaminophen (TYLENOL) 325 MG tablet Take 650 mg by mouth every 6 (six) hours as needed for moderate pain.    Historical Provider, MD  carvedilol (COREG) 12.5 MG tablet Take 1 tablet (12.5 mg total) by mouth 2 (two) times daily. 05/12/15   Lelon Perla, MD  clindamycin (CLEOCIN) 150 MG capsule Take 1 capsule (150 mg total) by mouth every 6 (six) hours. 07/11/15   Hanna Patel-Mills, PA-C  FOSRENOL 1000 MG chewable tablet CHEW AND SWALLOW 2 TABLETS BY MOUTH 3 TIMES A DAY WITH MEALS 05/21/15   Historical Provider, MD  glucose monitoring kit (FREESTYLE) monitoring kit 1 each by Does not apply route 4 (four) times daily - after meals and at bedtime. 1 month Diabetic Testing Supplies for QAC-QHS accuchecks.Any brand OK 11/29/14   Thurnell Lose, MD  HYDROcodone-acetaminophen (NORCO/VICODIN) 5-325 MG per tablet Take 1-2 tablets by mouth every 6 (six) hours as needed for moderate pain. 11/29/14   Thurnell Lose, MD  insulin aspart (NOVOLOG) 100 UNIT/ML injection Before each meal 3 times a day, 140-199 - 2 units, 200-250 - 4 units, 251-299 - 6 units,  300-349 - 8 units,  350 or above 10 units. Insulin PEN if approved, provide syringes and needles if needed. 11/29/14   Thurnell Lose, MD  Insulin Syringe-Needle U-100 25G X 1" 1 ML MISC Any brand, for 4 times a day insulin SQ, 1 month supply. 11/29/14   Thurnell Lose, MD    multivitamin (RENA-VIT) TABS tablet Take 1 tablet by mouth at bedtime. 11/29/14   Thurnell Lose, MD  SANTYL ointment Use daily as prescribed 07/25/15   Historical Provider, MD  SENSIPAR 30 MG tablet Take 30 mg by mouth daily. Take 1 tab daily 07/15/15   Historical Provider, MD  Vitamin D, Ergocalciferol, (DRISDOL) 50000 UNITS CAPS capsule Take 50,000 Units by mouth every 7 (seven) days. Wednesdays.    Historical Provider, MD    Physical Exam: Vitals:   09/11/16 1404  BP: 100/57  Pulse: 96  Resp: 16  Temp: 100.4 F (38 C)  TempSrc: Oral  SpO2: 97%      Constitutional: NAD, calm, comfortable Vitals:   09/11/16 1404  BP: 100/57  Pulse: 96  Resp: 16  Temp: 100.4 F (38 C)  TempSrc: Oral  SpO2: 97%   Eyes: PERRL, lids and conjunctivae normal ENMT: Mucous membranes are moist. Posterior pharynx clear of any exudate or lesions.Normal dentition.  Neck: normal, supple,  no masses, no thyromegaly Respiratory: clear to auscultation bilaterally, no wheezing, no crackles. Normal respiratory effort. No accessory muscle use.  Cardiovascular: Regular rate and rhythm, no murmurs / rubs / gallops. No extremity edema. 2+ pedal pulses. No carotid bruits.  Abdomen: no tenderness, no masses palpated. No hepatosplenomegaly. Bowel sounds positive.  Musculoskeletal: no clubbing / cyanosis. No joint deformity upper and lower extremities. Good ROM, no contractures. Normal muscle tone.  Skin: He has several toes missing and contracture on his left foot but his right foot shows an ulcer that is draining pale but no fluctuation. Neurologic: CN 2-12 grossly intact. Sensation intact, DTR normal. Strength 5/5 in all 4.  Psychiatric: Normal judgment and insight. Alert and oriented x 3. Normal mood.   Labs on Admission: I have personally reviewed following labs and imaging studies  CBC:  Recent Labs Lab 09/11/16 1610  WBC 3.6*  NEUTROABS PENDING  HGB 13.3  HCT 40.8  MCV 96.9  PLT 419   Basic  Metabolic Panel:  Recent Labs Lab 09/11/16 1449  NA 135  K 4.2  CL 93*  CO2 29  GLUCOSE 208*  BUN 21*  CREATININE 8.24*  CALCIUM 8.7*   GFR: CrCl cannot be calculated (Unknown ideal weight.). Liver Function Tests:  Recent Labs Lab 09/11/16 1449  AST 18  ALT 13*  ALKPHOS 56  BILITOT 0.5  PROT 8.6*  ALBUMIN 3.6   No results for input(s): LIPASE, AMYLASE in the last 168 hours. No results for input(s): AMMONIA in the last 168 hours. Coagulation Profile: No results for input(s): INR, PROTIME in the last 168 hours. Cardiac Enzymes: No results for input(s): CKTOTAL, CKMB, CKMBINDEX, TROPONINI in the last 168 hours. BNP (last 3 results) No results for input(s): PROBNP in the last 8760 hours. HbA1C: No results for input(s): HGBA1C in the last 72 hours. CBG: No results for input(s): GLUCAP in the last 168 hours. Lipid Profile: No results for input(s): CHOL, HDL, LDLCALC, TRIG, CHOLHDL, LDLDIRECT in the last 72 hours. Thyroid Function Tests: No results for input(s): TSH, T4TOTAL, FREET4, T3FREE, THYROIDAB in the last 72 hours. Anemia Panel: No results for input(s): VITAMINB12, FOLATE, FERRITIN, TIBC, IRON, RETICCTPCT in the last 72 hours. Urine analysis:    Component Value Date/Time   COLORURINE YELLOW 11/22/2014 1740   APPEARANCEUR CLEAR 11/22/2014 1740   LABSPEC 1.011 11/22/2014 1740   PHURINE 5.5 11/22/2014 1740   GLUCOSEU 100 (A) 11/22/2014 1740   HGBUR MODERATE (A) 11/22/2014 1740   BILIRUBINUR NEGATIVE 11/22/2014 1740   KETONESUR NEGATIVE 11/22/2014 1740   PROTEINUR 100 (A) 11/22/2014 1740   UROBILINOGEN 0.2 11/22/2014 1740   NITRITE NEGATIVE 11/22/2014 1740   LEUKOCYTESUR NEGATIVE 11/22/2014 1740   Sepsis Labs: !!!!!!!!!!!!!!!!!!!!!!!!!!!!!!!!!!!!!!!!!!!! '@LABRCNTIP' (procalcitonin:4,lacticidven:4) )No results found for this or any previous visit (from the past 240 hour(s)).   Radiological Exams on Admission: Dg Chest 2 View  Result Date:  09/11/2016 CLINICAL DATA:  Left lower anterior chest pain. EXAM: CHEST  2 VIEW COMPARISON:  11/22/2014 FINDINGS: The heart size and mediastinal contours are within normal limits. There is no evidence of pulmonary edema, consolidation, pneumothorax, nodule or pleural fluid. The visualized skeletal structures are unremarkable. IMPRESSION: No active cardiopulmonary disease. Electronically Signed   By: Aletta Edouard M.D.   On: 09/11/2016 14:28   Dg Foot Complete Right  Result Date: 09/11/2016 CLINICAL DATA:  Nonhealing wound at plantar aspect of RIGHT foot near the distal first and second metatarsals, wound present for 10 months weeping clear fluid, history diabetes mellitus,  amputation of little total 1 year ago, hypertension, end-stage renal disease, cardiomyopathy EXAM: RIGHT FOOT COMPLETE - 3+ VIEW COMPARISON:  05/18/2016 FINDINGS: Osseous demineralization. Prior amputation of the fifth toe through the level of the distal fifth metatarsal. Joint spaces preserved. Toes flexed limiting assessment. No definite fracture, dislocation, or bone destruction. Extensive small vessel vascular calcification. No definite soft tissue gas identified. Mild soft tissue swelling of forefoot. IMPRESSION: Osseous demineralization with evidence of prior fifth toe amputation. No definite acute bony abnormalities. If there is persistent clinical concern for osteomyelitis, consider MR. Electronically Signed   By: Lavonia Dana M.D.   On: 09/11/2016 16:36    EKG: Independently reviewed.None  Assessment/Plan Fever of unclear source: I am concerned about a right foot osteomyelitis, his chest x-ray was negative although he has been complaining cough and shortness of breath. His lactic acid is less than 2. He has gotten 1 dose of IV vancomycin and cefepime here in the ED. Blood cultures were ordered prior to IV antibiotics. I will go ahead and get an MRI of the right foot, check an ESR. His blood pressure is borderline 100/57, I  think he is hemoconcentrated see below for further details. Start him on gentle IV fluid hydration. He's Tylenol for fevers.  Anemia due to end stage renal disease ([place]): His hemoglobin usually runs around 8-9 on the last several visits it has been elevated today in the ED is 13 I think there is a degree of hemoconcentration. We'll defer further management per renal. Recheck a CBC in the morning.  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  ESRD on dialysis [facility]) Already talk with the nephrologist, he will see him tomorrow morning. I agree as he doesn't require urgent dialysis.  Leukopenia: Unclear could be due to infection, his leukocytes have always been greater than 5k.   DVT prophylaxis: Heparin Code Status: full Family Communication: none Disposition Plan: inpatinet Consults called: nephrology Dr. Posey Pronto Admission status: inpatient   Charlynne Cousins MD [facility] Pager [phone]  If 7PM-7AM, please contact night-coverage [email or web address] Password [facility]  09/11/2016, 5:21 PM

## 2016-09-11 NOTE — ED Notes (Signed)
Sandwich given. Patient made aware urine sample is needed. Will get after done eating

## 2016-09-11 NOTE — ED Notes (Signed)
Writer notified EDP Tyrone Nine of abnormal I-stat

## 2016-09-11 NOTE — ED Provider Notes (Signed)
[facility] DEPT Provider Note   CSN: [id number] Arrival date & time: 09/11/16  1353     History   Chief Complaint Chief Complaint  Patient presents with  . Abdominal Pain  . Fever  . Fatigue    HPI KLEIN WILLCOX Sr. is a 65 y.o. male.  65 yo M with a chief complaint of fevers cough left-sided chest pain and hypertension. Patient is normally a Monday Wednesday Friday dialysis patient this Friday due to feeling so sick. He's been weak and having trouble getting out of bed. Had a still in dialysis session today for 3 hours. They're concerned about his blood pressure being low and sent into the emergency department for evaluation. Patient has had a chronic diabetic wound to his right foot that has been getting deeper according to him. They have been following this very closely and he has an MRI scheduled on Tuesday to evaluate for osteomyelitis. Patient describes a sharp shooting left-sided chest pain worse with cough. Feels that it's in his abdomen as well.   The history is provided by the patient and the spouse.  Abdominal Pain   Associated symptoms include fever. Pertinent negatives include diarrhea, vomiting, headaches, arthralgias and myalgias.  Fever   Associated symptoms include cough. Pertinent negatives include no chest pain, no diarrhea, no vomiting, no congestion and no headaches.  Illness  This is a new problem. The current episode started more than 2 days ago. The problem occurs constantly. The problem has not changed since onset.Pertinent negatives include no chest pain, no abdominal pain, no headaches and no shortness of breath. Nothing aggravates the symptoms. Nothing relieves the symptoms. He has tried nothing for the symptoms. The treatment provided no relief.    Past Medical History:  Diagnosis Date  . Cardiomyopathy ([place])   . Diabetes mellitus   . Diabetic foot ulcer ([place])   . Diabetic peripheral neuropathy ([place])   . ESRD (end stage renal disease) ([place])     . HTN (hypertension)   . Osteomyelitis ([place])   . Peripheral vascular disease ([place])    critical limb ischemia    Patient Active Problem List   Diagnosis Date Noted  . Osteomyelitis ([place]) 09/11/2016  . Fever 09/11/2016  . Peripheral arterial disease ([place]) 08/12/2015  . Paroxysmal supraventricular tachycardia ([place]) 01/30/2015  . Occult blood in stools 11/25/2014  . Acute systolic heart failure ([place]) 11/25/2014  . ESRD on dialysis ([place]) 11/25/2014  . Congestive dilated cardiomyopathy ([place]) 11/24/2014  . Visual changes 11/23/2014  . Cardiomegaly 11/23/2014  . Benign essential HTN 11/23/2014  . 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 Anemia due to chronic blood loss 11/23/2014  . Anemia 11/22/2014  . Symptomatic anemia   . Cellulitis and abscess of hand 01/28/2012  . HTN (hypertension) 01/28/2012  . Hyponatremia 01/28/2012  . Normocytic anemia 01/28/2012  . Sinusitis 01/28/2012    Past Surgical History:  Procedure Laterality Date  . AV FISTULA PLACEMENT Left 11/27/2014   Procedure: ARTERIOVENOUS (AV) FISTULA CREATION;  Surgeon: Conrad San Simeon, MD;  Location: [facility];  Service: Vascular;  Laterality: Left;  . CHOLECYSTECTOMY    . COLONOSCOPY N/A 11/28/2014   Procedure: COLONOSCOPY;  Surgeon: Ladene Artist, MD;  Location: [facility] ENDOSCOPY;  Service: Endoscopy;  Laterality: N/A;  . ESOPHAGOGASTRODUODENOSCOPY N/A 11/28/2014   Procedure: ESOPHAGOGASTRODUODENOSCOPY (EGD);  Surgeon: Ladene Artist, MD;  Location: [facility] ENDOSCOPY;  Service: Endoscopy;  Laterality: N/A;  . I&D EXTREMITY  01/28/2012   Procedure:  IRRIGATION AND DEBRIDEMENT EXTREMITY;  Surgeon: Paulene Floor, MD;  Location: [facility] ORS;  Service: Orthopedics;  Laterality: Left;  irrigation and drainage left hand  . I&D EXTREMITY  01/30/2012   Procedure: IRRIGATION AND DEBRIDEMENT EXTREMITY;  Surgeon: Paulene Floor, MD;  Location: [facility] ORS;  Service: Orthopedics;  Laterality: Left;  flexor  teno synovectomy and extensor tenosynovectomy arthrosynovectomy wristjoint  . I&D EXTREMITY  02/01/2012   Procedure: IRRIGATION AND DEBRIDEMENT EXTREMITY;  Surgeon: Paulene Floor, MD;  Location: [facility] ORS;  Service: Orthopedics;  Laterality: Left;  Left Wrist, Hand and Forearm  . Left 4th toe amputation    . LEFT AND RIGHT HEART CATHETERIZATION WITH CORONARY ANGIOGRAM N/A 11/26/2014   Procedure: LEFT AND RIGHT HEART CATHETERIZATION WITH CORONARY ANGIOGRAM;  Surgeon: Leonie Man, MD;  Location: [facility] CATH LAB;  Service: Cardiovascular;  Laterality: N/A;  . TONSILLECTOMY         Home Medications    Prior to Admission medications   Medication Sig Start Date End Date Taking? Authorizing Provider  acetaminophen (TYLENOL) 325 MG tablet Take 650 mg by mouth every 6 (six) hours as needed for moderate pain.    Historical Provider, MD  carvedilol (COREG) 12.5 MG tablet Take 1 tablet (12.5 mg total) by mouth 2 (two) times daily. 05/12/15   Lelon Perla, MD  clindamycin (CLEOCIN) 150 MG capsule Take 1 capsule (150 mg total) by mouth every 6 (six) hours. 07/11/15   Hanna Patel-Mills, PA-C  FOSRENOL 1000 MG chewable tablet CHEW AND SWALLOW 2 TABLETS BY MOUTH 3 TIMES A DAY WITH MEALS 05/21/15   Historical Provider, MD  glucose monitoring kit (FREESTYLE) monitoring kit 1 each by Does not apply route 4 (four) times daily - after meals and at bedtime. 1 month Diabetic Testing Supplies for QAC-QHS accuchecks.Any brand OK 11/29/14   Thurnell Lose, MD  HYDROcodone-acetaminophen (NORCO/VICODIN) 5-325 MG per tablet Take 1-2 tablets by mouth every 6 (six) hours as needed for moderate pain. 11/29/14   Thurnell Lose, MD  insulin aspart (NOVOLOG) 100 UNIT/ML injection Before each meal 3 times a day, 140-199 - 2 units, 200-250 - 4 units, 251-299 - 6 units,  300-349 - 8 units,  350 or above 10 units. Insulin PEN if approved, provide syringes and needles if needed. 11/29/14   Thurnell Lose, MD  Insulin  Syringe-Needle U-100 25G X 1" 1 ML MISC Any brand, for 4 times a day insulin SQ, 1 month supply. 11/29/14   Thurnell Lose, MD  multivitamin (RENA-VIT) TABS tablet Take 1 tablet by mouth at bedtime. 11/29/14   Thurnell Lose, MD  SANTYL ointment Use daily as prescribed 07/25/15   Historical Provider, MD  SENSIPAR 30 MG tablet Take 30 mg by mouth daily. Take 1 tab daily 07/15/15   Historical Provider, MD  Vitamin D, Ergocalciferol, (DRISDOL) 50000 UNITS CAPS capsule Take 50,000 Units by mouth every 7 (seven) days. Wednesdays.    Historical Provider, MD    Family History Family History  Problem Relation Age of Onset  . Heart disease Mother   . Diabetes Father   . Diabetes Brother   . Diabetes Sister   . Diabetes Brother   . Diabetes Brother     Social History Social History  Substance Use Topics  . Smoking status: Former Smoker    Packs/day: 0.50    Years: 4.00    Types: Cigarettes  . Smokeless tobacco: Never Used  . Alcohol use No  Allergies   Penicillins   Review of Systems Review of Systems  Constitutional: Positive for chills and fever.  HENT: Negative for congestion and facial swelling.   Eyes: Negative for discharge and visual disturbance.  Respiratory: Positive for cough. Negative for shortness of breath.   Cardiovascular: Negative for chest pain and palpitations.  Gastrointestinal: Negative for abdominal pain, diarrhea and vomiting.  Musculoskeletal: Negative for arthralgias and myalgias.  Skin: Negative for color change and rash.  Neurological: Positive for weakness. Negative for tremors, syncope and headaches.  Psychiatric/Behavioral: Negative for confusion and dysphoric mood.     Physical Exam Updated Vital Signs BP 100/57 (BP Location: Right Arm)   Pulse 96   Temp 100.4 F (38 C) (Oral) Comment: Took tylenol this morning.  Resp 16   SpO2 97%   Physical Exam  Constitutional: He is oriented to person, place, and time. He appears well-developed and  well-nourished.  HENT:  Head: Normocephalic and atraumatic.  Eyes: EOM are normal. Pupils are equal, round, and reactive to light.  Neck: Normal range of motion. Neck supple. No JVD present.  Cardiovascular: Normal rate and regular rhythm.  Exam reveals no gallop and no friction rub.   No murmur heard. Pulmonary/Chest: Breath sounds normal. No respiratory distress. He has no wheezes.  Abdominal: He exhibits no distension and no mass. There is no tenderness. There is no rebound and no guarding.  Musculoskeletal: Normal range of motion.  Deep wound to the plantar aspect of the right MTP. Able to palpate bone with my finger. Serous drainage. No surrounding erythema.  Neurological: He is alert and oriented to person, place, and time.  Skin: No rash noted. No pallor.  Psychiatric: He has a normal mood and affect. His behavior is normal.  Nursing note and vitals reviewed.    ED Treatments / Results  Labs (all labs ordered are listed, but only abnormal results are displayed) Labs Reviewed  COMPREHENSIVE METABOLIC PANEL - Abnormal; Notable for the following:       Result Value   Chloride 93 (*)    Glucose, Bld 208 (*)    BUN 21 (*)    Creatinine, Ser 8.24 (*)    Calcium 8.7 (*)    Total Protein 8.6 (*)    ALT 13 (*)    GFR calc non Af Amer 6 (*)    GFR calc Af Amer 7 (*)    All other components within normal limits  CBC WITH DIFFERENTIAL/PLATELET - Abnormal; Notable for the following:    WBC 3.6 (*)    RBC 4.21 (*)    All other components within normal limits  I-STAT TROPOININ, ED - Abnormal; Notable for the following:    Troponin i, poc 0.10 (*)    All other components within normal limits  URINE CULTURE  CULTURE, BLOOD (ROUTINE X 2)  CULTURE, BLOOD (ROUTINE X 2)  URINALYSIS, ROUTINE W REFLEX MICROSCOPIC (NOT AT [facility])  I-STAT CG4 LACTIC ACID, ED  I-STAT CG4 LACTIC ACID, ED  I-STAT CHEM 8, ED    EKG  EKG Interpretation None       Radiology Dg Chest 2 View  Result  Date: 09/11/2016 CLINICAL DATA:  Left lower anterior chest pain. EXAM: CHEST  2 VIEW COMPARISON:  11/22/2014 FINDINGS: The heart size and mediastinal contours are within normal limits. There is no evidence of pulmonary edema, consolidation, pneumothorax, nodule or pleural fluid. The visualized skeletal structures are unremarkable. IMPRESSION: No active cardiopulmonary disease. Electronically Signed   By: Eulas Post  Kathlene Cote M.D.   On: 09/11/2016 14:28   Dg Foot Complete Right  Result Date: 09/11/2016 CLINICAL DATA:  Nonhealing wound at plantar aspect of RIGHT foot near the distal first and second metatarsals, wound present for 10 months weeping clear fluid, history diabetes mellitus, amputation of little total 1 year ago, hypertension, end-stage renal disease, cardiomyopathy EXAM: RIGHT FOOT COMPLETE - 3+ VIEW COMPARISON:  05/18/2016 FINDINGS: Osseous demineralization. Prior amputation of the fifth toe through the level of the distal fifth metatarsal. Joint spaces preserved. Toes flexed limiting assessment. No definite fracture, dislocation, or bone destruction. Extensive small vessel vascular calcification. No definite soft tissue gas identified. Mild soft tissue swelling of forefoot. IMPRESSION: Osseous demineralization with evidence of prior fifth toe amputation. No definite acute bony abnormalities. If there is persistent clinical concern for osteomyelitis, consider MR. Electronically Signed   By: Lavonia Dana M.D.   On: 09/11/2016 16:36    Procedures Procedures (including critical care time)  Medications Ordered in ED Medications  vancomycin (VANCOCIN) 1,500 mg in sodium chloride 0.9 % 500 mL IVPB (1,500 mg Intravenous New Bag/Given 09/11/16 1639)  ceFEPIme (MAXIPIME) 2 g in dextrose 5 % 50 mL IVPB (0 g Intravenous Stopped 09/11/16 1637)     Initial Impression / Assessment and Plan / ED Course  I have reviewed the triage vital signs and the nursing notes.  Pertinent labs & imaging results that were  available during my care of the patient were reviewed by me and considered in my medical decision making (see chart for details).  Clinical Course    65 yo M with a cc of fevers, chills, and left sided chest pain.  Story initially concerning for pna, patient is on dialysis making him at high risk for HCAP.  Not hypoxic, BP soft in the 825'K systolic.  Also has worsening wound to foot that there is already concern for osteomyelitis.  Will cover with vanc, zosyn.  Trop ordered in triage is + but only mildly so, suspect demand ischemia.  Discussed with hospitalist, will admit.   The patients results and plan were reviewed and discussed.   Any x-rays performed were independently reviewed by myself.   Differential diagnosis were considered with the presenting HPI.  Medications  vancomycin (VANCOCIN) 1,500 mg in sodium chloride 0.9 % 500 mL IVPB (1,500 mg Intravenous New Bag/Given 09/11/16 1639)  ceFEPIme (MAXIPIME) 2 g in dextrose 5 % 50 mL IVPB (0 g Intravenous Stopped 09/11/16 1637)    Vitals:   09/11/16 1404  BP: 100/57  Pulse: 96  Resp: 16  Temp: 100.4 F (38 C)  TempSrc: Oral  SpO2: 97%    Final diagnoses:  Acute osteomyelitis of right foot ([place])    Admission/ observation were discussed with the admitting physician, patient and/or family and they are comfortable with the plan.    Final Clinical Impressions(s) / ED Diagnoses   Final diagnoses:  Acute osteomyelitis of right foot [facility])    New Prescriptions New Prescriptions   No medications on file     Deno Etienne, DO 09/11/16 1714

## 2016-09-11 NOTE — ED Notes (Signed)
Unable to collect labs at this time patient in xray

## 2016-09-11 NOTE — Progress Notes (Signed)
Pharmacy Antibiotic Note  [name]. is a 65 y.o. male with ESRD on HD MWF admitted on 09/11/2016 with chest and abdominal pain, fevers, and fatigue. Skipped Friday HD and had abbreviated session today d/t feeling poorly. Noted worsening DM foot wound, undergoing workup for osteomyelitis.  Pharmacy has been consulted for cefepime dosing for possible OM. Vancomycin x 1 per EDP.  Plan:  Cefepime 2g IV x 1; if receiving HD can give 2g qHD.  Otherwise recommend 1g q24  Vancomycin load given; f/u dosing once HD plans determined.  If no HD soon would check a random level in 48 hr.     Temp (24hrs), Avg:100.4 F (38 C), Min:100.4 F (38 C), Max:100.4 F (38 C)   Recent Labs Lab 09/11/16 1449 09/11/16 1500  CREATININE 8.24*  --   LATICACIDVEN  --  1.33    CrCl cannot be calculated (Unknown ideal weight.).    Allergies  Allergen Reactions  . Penicillins Hives and Swelling    Has patient had a PCN reaction causing immediate rash, facial/tongue/throat swelling, SOB or lightheadedness with hypotension: no- ankle swelling Has patient had a PCN reaction causing severe rash involving mucus membranes or skin necrosis: no Has patient had a PCN reaction that required hospitalization: no Has patient had a PCN reaction occurring within the last 10 years: no If all of the above answers are "NO", then may proceed with Cephalosporin use.    Antimicrobials this admission: 9/16 Cefepime x 1 9/16 Vancomycin x 1  Dose adjustments this admission: ---  Microbiology results: 9/16 BCx: sent  Thank you for allowing pharmacy to be a part of this patient's care.  Reuel Boom, PharmD, BCPS Pager: [phone] 09/11/2016, 4:40 PM

## 2016-09-11 NOTE — ED Triage Notes (Signed)
Pt sent here from dialysis, received dialysis today. C/o chills onset Monday, hypotension onset Wednesday, fever up to 99.6 F, nausea, sudden sharp pain to left epigastric area onset Wednesday, SOB.

## 2016-09-11 NOTE — ED Notes (Signed)
Bed: WA03 Expected date:  Expected time:  Means of arrival:  Comments: Hold for triage 2 

## 2016-09-12 LAB — RENAL FUNCTION PANEL
ALBUMIN: 2.6 g/dL — AB (ref 3.5–5.0)
Anion gap: 11 (ref 5–15)
BUN: 24 mg/dL — ABNORMAL HIGH (ref 6–20)
CALCIUM: 8 mg/dL — AB (ref 8.9–10.3)
CO2: 30 mmol/L (ref 22–32)
CREATININE: 10.11 mg/dL — AB (ref 0.61–1.24)
Chloride: 96 mmol/L — ABNORMAL LOW (ref 101–111)
GFR, EST AFRICAN AMERICAN: 5 mL/min — AB (ref >60)
GFR, EST NON AFRICAN AMERICAN: 5 mL/min — AB (ref >60)
Glucose, Bld: 148 mg/dL — ABNORMAL HIGH (ref 65–99)
Phosphorus: 5 mg/dL — ABNORMAL HIGH (ref 2.5–4.6)
Potassium: 4.5 mmol/L (ref 3.5–5.1)
SODIUM: 137 mmol/L (ref 135–145)

## 2016-09-12 LAB — CBC
HCT: 38.1 % — ABNORMAL LOW (ref 39.0–52.0)
Hemoglobin: 11.8 g/dL — ABNORMAL LOW (ref 13.0–17.0)
MCH: 30.8 pg (ref 26.0–34.0)
MCHC: 31 g/dL (ref 30.0–36.0)
MCV: 99.5 fL (ref 78.0–100.0)
PLATELETS: 287 10*3/uL (ref 150–400)
RBC: 3.83 MIL/uL — AB (ref 4.22–5.81)
RDW: 15.1 % (ref 11.5–15.5)
WBC: 5 10*3/uL (ref 4.0–10.5)

## 2016-09-12 LAB — GLUCOSE, CAPILLARY
GLUCOSE-CAPILLARY: 127 mg/dL — AB (ref 65–99)
Glucose-Capillary: 121 mg/dL — ABNORMAL HIGH (ref 65–99)
Glucose-Capillary: 137 mg/dL — ABNORMAL HIGH (ref 65–99)
Glucose-Capillary: 154 mg/dL — ABNORMAL HIGH (ref 65–99)

## 2016-09-12 LAB — HEMOGLOBIN A1C
HEMOGLOBIN A1C: 7.7 % — AB (ref 4.8–5.6)
MEAN PLASMA GLUCOSE: 174 mg/dL

## 2016-09-12 LAB — MRSA PCR SCREENING: MRSA by PCR: NEGATIVE

## 2016-09-12 MED ORDER — INFLUENZA VAC SPLIT QUAD 0.5 ML IM SUSY: 0.5000 mL | PREFILLED_SYRINGE | INTRAMUSCULAR | Status: DC | PRN

## 2016-09-12 MED ORDER — CINACALCET HCL 30 MG PO TABS: 90.0000 mg | ORAL_TABLET | Freq: Every day | ORAL | Status: DC

## 2016-09-12 MED ORDER — SUCROFERRIC OXYHYDROXIDE 500 MG PO CHEW
500.0000 mg | CHEWABLE_TABLET | Freq: Three times a day (TID) | ORAL | Status: DC
  Administered 2016-09-12 – 2016-09-14 (×6): 500 mg via ORAL
  Filled 2016-09-12 (×8): qty 1

## 2016-09-12 MED ORDER — DEXTROSE 5 % IV SOLN
2.0000 g | INTRAVENOUS | Status: DC
  Filled 2016-09-12 (×2): qty 2

## 2016-09-12 MED ORDER — CARVEDILOL 12.5 MG PO TABS: 12.5000 mg | ORAL_TABLET | Freq: Two times a day (BID) | ORAL | Status: DC

## 2016-09-12 MED ORDER — VANCOMYCIN HCL IN DEXTROSE 1-5 GM/200ML-% IV SOLN
1000.0000 mg | INTRAVENOUS | Status: DC
  Administered 2016-09-13: 1000 mg via INTRAVENOUS
  Filled 2016-09-12: qty 200

## 2016-09-12 NOTE — Consult Note (Signed)
[name] Consultation [facility]    Indication for Consultation:  Management of ESRD/hemodialysis; anemia, hypertension/volume and secondary hyperparathyroidism PCP:  HPI: [name] Sr. is a 65 y.o. 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 He has [facility] M[name]. PMH significant for HTN, DM, PVD, diabetic foot ulcers, osteomyelitis, anemia of chronic disease, SHPT.   I was called from HD center yesterday about patient presenting with low grade temperature (99.3), malaise, increased drainage from R foot and hypotension for about 3-4 days. Blood cultures were done and asked that patient come to hospital post HD for evaluation of possible sepsis. Upon arrival to ED, T-100.4 BP 100/57 HF 96 RR 16 O2 sat 97% on RA. WBC 5.6 HGB 12.1 Na 137 K+ 4.5 Co2 30 Lactic acid 1.01. Xray R foot negative for osteo, recommended MRI if concerned for osteo, CXR unremarkable.  He was started on vanc/cefepime per primary, admitted with fever/possible osteo R. Foot.   Currently patient is awake, alert, C/O nausea, no emesis, has had low grade temperature at home, chills, malaise, diarrhea, abdominal pain RUQ/LUQ, no rebound tenderness, drainage from R foot past 2 weeks. Also says he had brief episode of chest pain this AM after eating but has passed now. Denies SOB, HA, syncope, dizziness, flank pain, tarry/bloody stools, hematemesis. Has had ongoing issues with nonhealing wound on halux R foot, has been following with [facility] wound care. History of amputations of toes, has had wounds on L foot which are now healed.   Patient is very well known to me, very pleasant, usually compliant with HD Rx. Rarely has high IDWG. He has had issues with hypotension, EDW has been titrated up. Per patient report,no longer on antihypertensive meds and he's been doing better on HD.   Past Medical History:  Diagnosis Date  . Cardiomyopathy ([place])   . Diabetes mellitus   . Diabetic foot  ulcer ([name])   . Diabetic peripheral neuropathy ([name])   . ESRD (end stage renal disease) ([place])   . HTN (hypertension)   . Osteomyelitis ([name])   . Peripheral vascular disease ([name])    critical limb ischemia   Past Surgical History:  Procedure Laterality Date  . AV FISTULA PLACEMENT Left 11/27/2014   Procedure: ARTERIOVENOUS (AV) FISTULA CREATION;  Surgeon: Conrad Freedom, MD;  Location: [facility];  Service: Vascular;  Laterality: Left;  . CHOLECYSTECTOMY    . COLONOSCOPY N/A 11/28/2014   Procedure: COLONOSCOPY;  Surgeon: Ladene Artist, MD;  Location: [facility] ENDOSCOPY;  Service: Endoscopy;  Laterality: N/A;  . ESOPHAGOGASTRODUODENOSCOPY N/A 11/28/2014   Procedure: ESOPHAGOGASTRODUODENOSCOPY (EGD);  Surgeon: Ladene Artist, MD;  Location: [facility] ENDOSCOPY;  Service: Endoscopy;  Laterality: N/A;  . I&D EXTREMITY  01/28/2012   Procedure: IRRIGATION AND DEBRIDEMENT EXTREMITY;  Surgeon: Paulene Floor, MD;  Location: [facility];  Service: Orthopedics;  Laterality: Left;  irrigation and drainage left hand  . I&D EXTREMITY  01/30/2012   Procedure: IRRIGATION AND DEBRIDEMENT EXTREMITY;  Surgeon: Paulene Floor, MD;  Location: [facility];  Service: Orthopedics;  Laterality: Left;  flexor teno synovectomy and extensor tenosynovectomy arthrosynovectomy wristjoint  . I&D EXTREMITY  02/01/2012   Procedure: IRRIGATION AND DEBRIDEMENT EXTREMITY;  Surgeon: Paulene Floor, MD;  Location: [facility];  Service: Orthopedics;  Laterality: Left;  Left Wrist, Hand and Forearm  . Left 4th toe amputation    . LEFT AND RIGHT HEART CATHETERIZATION WITH CORONARY ANGIOGRAM N/A 11/26/2014  Procedure: LEFT AND RIGHT HEART CATHETERIZATION WITH CORONARY ANGIOGRAM;  Surgeon: Leonie Man, MD;  Location: [facility] CATH LAB;  Service: Cardiovascular;  Laterality: N/A;  . TONSILLECTOMY     Family History  Problem Relation Age of Onset  . Heart disease Mother   . Diabetes Father   . Diabetes Brother   . Diabetes Sister   . Diabetes Brother    . Diabetes Brother    Social History:  reports that he has quit smoking. His smoking use included Cigarettes. He has a 2.00 pack-year smoking history. He has never used smokeless tobacco. He reports that he does not drink alcohol or use drugs. Allergies  Allergen Reactions  . Penicillins Hives and Swelling    Has patient had a PCN reaction causing immediate rash, facial/tongue/throat swelling, SOB or lightheadedness with hypotension: no- ankle swelling Has patient had a PCN reaction causing severe rash involving mucus membranes or skin necrosis: no Has patient had a PCN reaction that required hospitalization: no Has patient had a PCN reaction occurring within the last 10 years: no If all of the above answers are "NO", then may proceed with Cephalosporin use.   Prior to Admission medications   Medication Sig Start Date End Date Taking? Authorizing Provider  acetaminophen (TYLENOL) 325 MG tablet Take 650 mg by mouth every 6 (six) hours as needed for moderate pain.    Historical Provider, MD  carvedilol (COREG) 12.5 MG tablet Take 1 tablet (12.5 mg total) by mouth 2 (two) times daily. 05/12/15   Lelon Perla, MD  clindamycin (CLEOCIN) 150 MG capsule Take 1 capsule (150 mg total) by mouth every 6 (six) hours. 07/11/15   Hanna Patel-Mills, PA-C  FOSRENOL 1000 MG chewable tablet CHEW AND SWALLOW 2 TABLETS BY MOUTH 3 TIMES A DAY WITH MEALS 05/21/15   Historical Provider, MD  glucose monitoring kit (FREESTYLE) monitoring kit 1 each by Does not apply route 4 (four) times daily - after meals and at bedtime. 1 month Diabetic Testing Supplies for QAC-QHS accuchecks.Any brand OK 11/29/14   Thurnell Lose, MD  HYDROcodone-acetaminophen (NORCO/VICODIN) 5-325 MG per tablet Take 1-2 tablets by mouth every 6 (six) hours as needed for moderate pain. 11/29/14   Thurnell Lose, MD  insulin aspart (NOVOLOG) 100 UNIT/ML injection Before each meal 3 times a day, 140-199 - 2 units, 200-250 - 4 units, 251-299 - 6  units,  300-349 - 8 units,  350 or above 10 units. Insulin PEN if approved, provide syringes and needles if needed. 11/29/14   Thurnell Lose, MD  Insulin Syringe-Needle U-100 25G X 1" 1 ML MISC Any brand, for 4 times a day insulin SQ, 1 month supply. 11/29/14   Thurnell Lose, MD  multivitamin (RENA-VIT) TABS tablet Take 1 tablet by mouth at bedtime. 11/29/14   Thurnell Lose, MD  mupirocin ointment (BACTROBAN) 2 % Apply 1 application topically 3 (three) times daily. Applied to foot wound 08/05/16   Historical Provider, MD  SANTYL ointment Use daily as prescribed 07/25/15   Historical Provider, MD  SENSIPAR 30 MG tablet Take 30 mg by mouth daily. Take 1 tab daily 07/15/15   Historical Provider, MD  Vitamin D, Ergocalciferol, (DRISDOL) 50000 UNITS CAPS capsule Take 50,000 Units by mouth every 7 (seven) days. Wednesdays.    Historical Provider, MD   Current Facility-Administered Medications  Medication Dose Route Frequency Provider Last Rate Last Dose  . 0.9 %  sodium chloride infusion  250 mL Intravenous PRN Bess Harvest  Ortiz, MD      . acetaminophen (TYLENOL) tablet 650 mg  650 mg Oral Q6H PRN Abraham Feliz Ortiz, MD       Or  . acetaminophen (TYLENOL) suppository 650 mg  650 mg Rectal Q6H PRN Abraham Feliz Ortiz, MD      . carvedilol (COREG) tablet 12.5 mg  12.5 mg Oral BID WC Abraham Feliz Ortiz, MD      . cinacalcet (SENSIPAR) tablet 30 mg  30 mg Oral Q breakfast Abraham Feliz Ortiz, MD   30 mg at 09/12/16 0809  . heparin injection 5,000 Units  5,000 Units Subcutaneous Q8H Abraham Feliz Ortiz, MD      . HYDROcodone-acetaminophen (NORCO/VICODIN) 5-325 MG per tablet 1-2 tablet  1-2 tablet Oral Q6H PRN Abraham Feliz Ortiz, MD   2 tablet at 09/12/16 1059  . Influenza vac split quadrivalent PF (FLUARIX) injection 0.5 mL  0.5 mL Intramuscular Prior to discharge Vijaya Akula, MD      . insulin aspart (novoLOG) injection 0-5 Units  0-5 Units Subcutaneous QHS Abraham Feliz Ortiz, MD      . insulin  aspart (novoLOG) injection 0-9 Units  0-9 Units Subcutaneous TID WC Abraham Feliz Ortiz, MD      . insulin aspart (novoLOG) injection 3 Units  3 Units Subcutaneous TID WC Abraham Feliz Ortiz, MD      . lanthanum (FOSRENOL) chewable tablet 1,000 mg  1,000 mg Oral TID WC Abraham Feliz Ortiz, MD      . ondansetron (ZOFRAN) tablet 4 mg  4 mg Oral Q6H PRN Abraham Feliz Ortiz, MD   4 mg at 09/11/16 2315   Or  . ondansetron (ZOFRAN) injection 4 mg  4 mg Intravenous Q6H PRN Abraham Feliz Ortiz, MD   4 mg at 09/12/16 1049  . polyethylene glycol (MIRALAX / GLYCOLAX) packet 17 g  17 g Oral Daily PRN Abraham Feliz Ortiz, MD      . sodium chloride flush (NS) 0.9 % injection 3 mL  3 mL Intravenous Q12H Abraham Feliz Ortiz, MD   3 mL at 09/11/16 2316  . sodium chloride flush (NS) 0.9 % injection 3 mL  3 mL Intravenous PRN [facility] Ortiz, MD      . [START ON 09/15/2016] Vitamin D (Ergocalciferol) (DRISDOL) capsule 50,000 Units  50,000 Units Oral Q Wed Abraham Feliz Ortiz, MD       Labs: Basic Metabolic Panel:  Recent Labs Lab 09/11/16 1449 09/11/16 1812 09/11/16 2223 09/12/16 0400  NA 135 135  --  137  K 4.2 6.4*  --  4.5  CL 93* 95*  --  96*  CO2 29  --   --  30  GLUCOSE 208* 162*  --  148*  BUN 21* 32*  --  24*  CREATININE 8.24* 8.50* 9.49* 10.11*  CALCIUM 8.7*  --   --  8.0*  PHOS  --   --   --  5.0*   Liver Function Tests:  Recent Labs Lab 09/11/16 1449 09/12/16 0400  AST 18  --   ALT 13*  --   ALKPHOS 56  --   BILITOT 0.5  --   PROT 8.6*  --   ALBUMIN 3.6 2.6*   CBC:  Recent Labs Lab 09/11/16 1610 09/11/16 1812 09/11/16 2223 09/12/16 0400  WBC 3.6*  --  5.6 5.0  NEUTROABS 2.3  --   --   --   HGB 13.3 14.3 12.1* 11.8*  HCT 40.8 42.0 38.5* 38.1*  MCV 96.9  --    97.7 99.5  PLT 289  --  261 287   Cardiac Enzymes: No results for input(s): CKTOTAL, CKMB, CKMBINDEX, TROPONINI in the last 168 hours. CBG:  Recent Labs Lab 09/11/16 2151 09/12/16 0806  GLUCAP 168* 127*    Iron Studies: No results for input(s): IRON, TIBC, TRANSFERRIN, FERRITIN in the last 72 hours. Studies/Results: Dg Chest 2 View  Result Date: 09/11/2016 CLINICAL DATA:  Left lower anterior chest pain. EXAM: CHEST  2 VIEW COMPARISON:  11/22/2014 FINDINGS: The heart size and mediastinal contours are within normal limits. There is no evidence of pulmonary edema, consolidation, pneumothorax, nodule or pleural fluid. The visualized skeletal structures are unremarkable. IMPRESSION: No active cardiopulmonary disease. Electronically Signed   By: Glenn  Yamagata M.D.   On: 09/11/2016 14:28   Dg Foot Complete Right  Result Date: 09/11/2016 CLINICAL DATA:  Nonhealing wound at plantar aspect of RIGHT foot near the distal first and second metatarsals, wound present for 10 months weeping clear fluid, history diabetes mellitus, amputation of little total 1 year ago, hypertension, end-stage renal disease, cardiomyopathy EXAM: RIGHT FOOT COMPLETE - 3+ VIEW COMPARISON:  05/18/2016 FINDINGS: Osseous demineralization. Prior amputation of the fifth toe through the level of the distal fifth metatarsal. Joint spaces preserved. Toes flexed limiting assessment. No definite fracture, dislocation, or bone destruction. Extensive small vessel vascular calcification. No definite soft tissue gas identified. Mild soft tissue swelling of forefoot. IMPRESSION: Osseous demineralization with evidence of prior fifth toe amputation. No definite acute bony abnormalities. If there is persistent clinical concern for osteomyelitis, consider MR. Electronically Signed   By: Mark  Boles M.D.   On: 09/11/2016 16:36    ROS: As per HPI otherwise negative.   Physical Exam: Vitals:   09/11/16 2154 09/12/16 0122 09/12/16 0444 09/12/16 1051  BP: (!) 112/45  (!) 101/48 (!) 134/57  Pulse: 86  70 81  Resp: (!) 22  (!) 21   Temp: (!) 100.5 F (38.1 C) 98.8 F (37.1 C) 98.6 F (37 C) 99.5 F (37.5 C)  TempSrc: Oral Oral Oral Oral  SpO2: 99%   100%   Weight: 95.1 kg (209 lb 10.5 oz)     Height: 6' 1" (1.854 m)        General: Well developed, well nourished, in no acute distress. Head: Normocephalic, atraumatic, sclera non-icteric, mucus membranes are moist Neck: Supple. JVD not elevated. Lungs: Clear bilaterally to auscultation without wheezes, rales, or rhonchi. Breathing is unlabored. Heart: RRR with S1 S2. 2/6 systolic M.  Abdomen: Soft, tender upper quadrants, non-distended with normoactive bowel sounds. No rebound/guarding. No obvious abdominal masses. M-S:  Strength and tone appear normal for age. Lower extremities: draining wound with white/grey drainage R halus. drsg intact. Amputated pinky toe R foot. Old healed wound L foot, amputated 4th toe L foot. No LE edema.  Neuro: Alert and oriented X 3. Moves all extremities spontaneously. Psych:  Responds to questions appropriately with a normal affect. Dialysis Access: LUA AVF + bruit  Dialysis Orders: [facility] MWF 4 hours 15 minutes 425/800 94 kg 2.0K/2.25Ca  Heparin 6000 units IV q treatment No ESA/No VDRA Labs: HGB 14.1 Ca 9.7 Phos 5.6 (09/08/16) PTH 68 (08/18/16)  Assessment/Plan: 1.  Fever/possible osteomyelitis R foot: BC pending. Started on Vanc/cefepime per primary. Xray of R. Foot No definite acute bony abnormalities. Getting MRI of R Foot.  2.  ESRD -  MWF [facility]. Had 3 hours of 4 hour 15 min treatment Saturday. Next HD tomorrow. K+ 4.5 2.0 K+   Bath.  3.  Hypertension/volume  - BP has been soft since arrival to hospital. Coreg 12.5 PO BID ordered-pt is not taking-DC. On IVF at 100cc/hr. DC now.  wt 95.1. Will attempt UFG 1-1.5 tomorrow.  4.  Anemia  - HGB 11.8. No OP ESA. None needed now.  5.  Metabolic bone disease - Ca 10.1 C Ca 11.2  Phos 5.0. Cont sensipar, no VDRA. 2.0 Ca bath. Has both auxyria and velphoro on OP med list. Will find out which is correct and order.  6.  Nutrition - Albumin 2.6. Chronic infections/wound R. Foot. Renal/Carb  mod diet. Renal vit/nepro.  7. DM: per primary.   Rita H. Brown, NP-C 09/12/2016, 11:00 AM  [name] [phone]      

## 2016-09-12 NOTE — Progress Notes (Signed)
Pharmacy Antibiotic Note  [name]. is a 65 y.o. male with ESRD on HD MWF admitted on 09/11/2016 with chest and abdominal pain, fevers, and fatigue. Skipped Friday HD and had abbreviated session on Saturdday 9/16 (3hr of 4h 37min HD session)  d/t feeling poorly. Noted worsening DM foot wound, undergoing workup for osteomyelitis.  Pharmacy consulted for cefepime and vancomycin  dosing for possible sepsis and osteomyelitis R foot. .  At [facility] ED:  Vancomycin 1500mg  IV  x 1 given on 9/16 @ 16:30  and cefepime 2gm IV x1 given 9/16 @16 :00 Plan for HD tomorrow 9/18  Plan:  Vancomycin 1g qHD qMWF Cefepime 2gm qHD qMWF Monitor pre HD vancomycin level per protocol , f/u cultures.    Height: 6\' 1"  (185.4 cm) Weight: 209 lb 10.5 oz (95.1 kg) IBW/kg (Calculated) : 79.9  Temp (24hrs), Avg:99.8 F (37.7 C), Min:98.6 F (37 C), Max:101.1 F (38.4 C)   Recent Labs Lab 09/11/16 1449 09/11/16 1500 09/11/16 1610 09/11/16 1812 09/11/16 1813 09/11/16 2223 09/12/16 0400  WBC  --   --  3.6*  --   --  5.6 5.0  CREATININE 8.24*  --   --  8.50*  --  9.49* 10.11*  LATICACIDVEN  --  1.33  --   --  1.01  --   --     Estimated Creatinine Clearance: 8.3 mL/min (by C-G formula based on SCr of 10.11 mg/dL (H)).    Allergies  Allergen Reactions  . Penicillins Hives and Swelling    Ankle swelling Has patient had a PCN reaction causing immediate rash, facial/tongue/throat swelling, SOB or lightheadedness with hypotension: yes Has patient had a PCN reaction causing severe rash involving mucus membranes or skin necrosis: no Has patient had a PCN reaction that required hospitalization: no Has patient had a PCN reaction occurring within the last 10 years: no If all of the above answers are "NO", then may proceed with Cephalosporin use.    Antimicrobials this admission: 9/16 Cefepime >> 9/16 Vancomycin>>  Dose adjustments this admission: ---  Microbiology results: 9/16 BCx: sent  Thank you  for allowing pharmacy to be a part of this patient's care. Nicole Cella, RPh Clinical Pharmacist Pager: [phone] 09/12/2016, 3:26 PM

## 2016-09-12 NOTE — Progress Notes (Signed)
New Admission Note:   Arrival Method: Via [facility] on stretcher Mental Orientation:  A & O x 4 Telemetry: Placed on Tele Box [id number] Assessment: Completed Skin:  See assessment IV:  Rt wrist NSL Pain:  Tubes:  None Safety Measures: Safety Fall Prevention Plan has been given, discussed and signed Admission: Completed [address] [place] Orientation: Patient has been orientated to the room, unit and staff.  Family:  Wife at bedside.  Orders have been reviewed and implemented. Will continue to monitor the patient. Call light has been placed within reach and bed alarm has been activated.   Earleen Reaper RN- [place], [place] Phone number: [phone]

## 2016-09-12 NOTE — Progress Notes (Signed)
PROGRESS NOTE    [name] GASPARINI Sr.  TMA:[id number] DOB: [birth year] DOA: 09/11/2016 PCP: [name], MD    Brief Narrative: [name] Sages Sr. is a 65 y.o. 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 Abnormal X RAY OF THE foot. Follow up with MRI of the right foot. Resume IV vancomycin and IV cefepime. Blood cultures done and pending.    Anemia from ESRD: Stable.   ESRD ON HD MWF. Further recommendations as per renal.   Type 2 DM: CBG (last 3)   Recent Labs  09/12/16 0806 09/12/16 1159 09/12/16 1627  GLUCAP 127* 121* 137*    Resume SSI.        DVT prophylaxis: none at bedside.  Code Status: full code. Family Communication: wife at bedside.  Disposition Plan: pending. Further evaluation.    Consultants:   None.    Procedures: MRI FOOT pending.    Antimicrobials: vancomycin and cefepime.    Subjective: abd pain this am, resolved with pain meds.   Objective: Vitals:   09/11/16 2154 09/12/16 0122 09/12/16 0444 09/12/16 1051  BP: (!) 112/45  (!) 101/48 (!) 134/57  Pulse: 86  70 81  Resp: (!) 22  (!) 21 20  Temp: (!) 100.5 F (38.1 C) 98.8 F (37.1 C) 98.6 F (37 C) 99.5 F (37.5 C)  TempSrc: Oral Oral Oral Oral  SpO2: 99%  100% 98%  Weight: 95.1 kg (209 lb 10.5 oz)     Height: 6\' 1"  (1.854 m)       Intake/Output Summary (Last 24  hours) at 09/12/16 1833 Last data filed at 09/12/16 1100  Gross per 24 hour  Intake             1535 ml  Output                0 ml  Net             1535 ml   Filed Weights   09/11/16 2154  Weight: 95.1 kg (209 lb 10.5 oz)    Examination:  General exam: Appears calm and comfortable  Respiratory system: Clear to auscultation. Respiratory effort normal. Cardiovascular system: S1 & S2 heard, RRR. No JVD, murmurs, rubs, gallops or clicks. No pedal edema. Gastrointestinal system: Abdomen is nondistended, soft and nontender. No organomegaly or masses felt. Normal bowel sounds heard. Central nervous system: Alert and oriented. No focal neurological deficits. Extremities: Right foot ulcer draining. Left foot contracture.      Data Reviewed: I have personally reviewed following labs and imaging studies  CBC:  Recent Labs Lab 09/11/16 1610 09/11/16 1812 09/11/16 2223 09/12/16 0400  WBC 3.6*  --  5.6 5.0  NEUTROABS 2.3  --   --   --   HGB 13.3 14.3 12.1* 11.8*  HCT 40.8 42.0 38.5* 38.1*  MCV 96.9  --  97.7 99.5  PLT 289  --  261 762   Basic Metabolic Panel:  Recent Labs Lab 09/11/16 1449 09/11/16 1812 09/11/16 2223 09/12/16 0400  NA 135 135  --  137  K 4.2 6.4*  --  4.5  CL 93* 95*  --  96*  CO2 29  --   --  30  GLUCOSE 208* 162*  --  148*  BUN 21* 32*  --  24*  CREATININE 8.24* 8.50* 9.49* 10.11*  CALCIUM 8.7*  --   --  8.0*  PHOS  --   --   --  5.0*   GFR: Estimated Creatinine Clearance: 8.3 mL/min (by C-G formula based on SCr of 10.11 mg/dL (H)). Liver Function Tests:  Recent Labs Lab 09/11/16 1449 09/12/16 0400  AST 18  --   ALT 13*  --   ALKPHOS 56  --   BILITOT 0.5  --   PROT 8.6*  --   ALBUMIN 3.6 2.6*   No results for input(s): LIPASE, AMYLASE in the last 168 hours. No results for input(s): AMMONIA in the last 168 hours. Coagulation Profile: No results for input(s): INR, PROTIME in the last 168 hours. Cardiac Enzymes: No results for  input(s): CKTOTAL, CKMB, CKMBINDEX, TROPONINI in the last 168 hours. BNP (last 3 results) No results for input(s): PROBNP in the last 8760 hours. HbA1C:  Recent Labs  09/11/16 2223  HGBA1C 7.7*   CBG:  Recent Labs Lab 09/11/16 2151 09/12/16 0806 09/12/16 1159 09/12/16 1627  GLUCAP 168* 127* 121* 137*   Lipid Profile: No results for input(s): CHOL, HDL, LDLCALC, TRIG, CHOLHDL, LDLDIRECT in the last 72 hours. Thyroid Function Tests: No results for input(s): TSH, T4TOTAL, FREET4, T3FREE, THYROIDAB in the last 72 hours. Anemia Panel: No results for input(s): VITAMINB12, FOLATE, FERRITIN, TIBC, IRON, RETICCTPCT in the last 72 hours. Sepsis Labs:  Recent Labs Lab 09/11/16 1500 09/11/16 1813  LATICACIDVEN 1.33 1.01    Recent Results (from the past 240 hour(s))  Culture, blood (Routine X 2)     Status: None (Preliminary result)   Collection Time: 09/11/16  2:49 PM  Result Value Ref Range Status   Specimen Description BLOOD RIGHT ANTECUBITAL  Final   Special Requests BOTTLES DRAWN AEROBIC AND ANAEROBIC 5CC  Final   Culture   Final    NO GROWTH < 24 HOURS Performed at [facility]    Report Status PENDING  Incomplete  Culture, blood (Routine X 2)     Status: None (Preliminary result)   Collection Time: 09/11/16  3:55 PM  Result Value Ref Range Status   Specimen Description BLOOD RIGHT WRIST  Final   Special Requests BOTTLES DRAWN AEROBIC AND ANAEROBIC 4CC  Final   Culture   Final    NO GROWTH < 24 HOURS Performed at [facility]    Report Status PENDING  Incomplete  MRSA PCR Screening     Status: None   Collection Time: 09/12/16  4:21 AM  Result Value Ref Range Status   MRSA by PCR NEGATIVE NEGATIVE Final    Comment:        The GeneXpert MRSA Assay (FDA approved for NASAL specimens only), is one component of a comprehensive MRSA colonization surveillance program. It is not intended to diagnose MRSA infection nor to guide or monitor treatment  for MRSA infections.          Radiology Studies: Dg Chest 2 View  Result Date: 09/11/2016 CLINICAL DATA:  Left lower anterior chest pain. EXAM:  CHEST  2 VIEW COMPARISON:  11/22/2014 FINDINGS: The heart size and mediastinal contours are within normal limits. There is no evidence of pulmonary edema, consolidation, pneumothorax, nodule or pleural fluid. The visualized skeletal structures are unremarkable. IMPRESSION: No active cardiopulmonary disease. Electronically Signed   By: Aletta Edouard M.D.   On: 09/11/2016 14:28   Dg Foot Complete Right  Result Date: 09/11/2016 CLINICAL DATA:  Nonhealing wound at plantar aspect of RIGHT foot near the distal first and second metatarsals, wound present for 10 months weeping clear fluid, history diabetes mellitus, amputation of little total 1 year ago, hypertension, end-stage renal disease, cardiomyopathy EXAM: RIGHT FOOT COMPLETE - 3+ VIEW COMPARISON:  05/18/2016 FINDINGS: Osseous demineralization. Prior amputation of the fifth toe through the level of the distal fifth metatarsal. Joint spaces preserved. Toes flexed limiting assessment. No definite fracture, dislocation, or bone destruction. Extensive small vessel vascular calcification. No definite soft tissue gas identified. Mild soft tissue swelling of forefoot. IMPRESSION: Osseous demineralization with evidence of prior fifth toe amputation. No definite acute bony abnormalities. If there is persistent clinical concern for osteomyelitis, consider MR. Electronically Signed   By: Lavonia Dana M.D.   On: 09/11/2016 16:36        Scheduled Meds: . [START ON 09/13/2016] ceFEPime (MAXIPIME) IV  2 g Intravenous Q M,W,F-HD  . [START ON 09/13/2016] cinacalcet  90 mg Oral Q supper  . heparin  5,000 Units Subcutaneous Q8H  . insulin aspart  0-5 Units Subcutaneous QHS  . insulin aspart  0-9 Units Subcutaneous TID WC  . insulin aspart  3 Units Subcutaneous TID WC  . sodium chloride flush  3 mL Intravenous Q12H   . sucroferric oxyhydroxide  500 mg Oral TID WC  . [START ON 09/13/2016] vancomycin  1,000 mg Intravenous Q M,W,F-HD  . [START ON 09/15/2016] Vitamin D (Ergocalciferol)  50,000 Units Oral Q Wed   Continuous Infusions:    LOS: 1 day    Time spent: 25 minutes.     Hosie Poisson, MD [facility] Pager [phone]  If 7PM-7AM, please contact night-coverage [email or web address] Password [employer] 09/12/2016, 6:33 PM

## 2016-09-13 ENCOUNTER — Inpatient Hospital Stay (HOSPITAL_COMMUNITY): Payer: Medicare Other

## 2016-09-13 DIAGNOSIS — M868X8 Other osteomyelitis, other site: Secondary | ICD-10-CM

## 2016-09-13 DIAGNOSIS — E1121 Type 2 diabetes mellitus with diabetic nephropathy: Secondary | ICD-10-CM

## 2016-09-13 DIAGNOSIS — N186 End stage renal disease: Secondary | ICD-10-CM

## 2016-09-13 DIAGNOSIS — Z794 Long term (current) use of insulin: Secondary | ICD-10-CM

## 2016-09-13 DIAGNOSIS — Z992 Dependence on renal dialysis: Secondary | ICD-10-CM

## 2016-09-13 DIAGNOSIS — D631 Anemia in chronic kidney disease: Secondary | ICD-10-CM

## 2016-09-13 LAB — CBC
HCT: 37.3 % — ABNORMAL LOW (ref 39.0–52.0)
Hemoglobin: 12 g/dL — ABNORMAL LOW (ref 13.0–17.0)
MCH: 31.5 pg (ref 26.0–34.0)
MCHC: 32.2 g/dL (ref 30.0–36.0)
MCV: 97.9 fL (ref 78.0–100.0)
Platelets: 284 10*3/uL (ref 150–400)
RBC: 3.81 MIL/uL — ABNORMAL LOW (ref 4.22–5.81)
RDW: 15.2 % (ref 11.5–15.5)
WBC: 5.5 10*3/uL (ref 4.0–10.5)

## 2016-09-13 LAB — RENAL FUNCTION PANEL
ALBUMIN: 2.7 g/dL — AB (ref 3.5–5.0)
Anion gap: 14 (ref 5–15)
BUN: 31 mg/dL — AB (ref 6–20)
CALCIUM: 7.9 mg/dL — AB (ref 8.9–10.3)
CO2: 22 mmol/L (ref 22–32)
CREATININE: 12.69 mg/dL — AB (ref 0.61–1.24)
Chloride: 99 mmol/L — ABNORMAL LOW (ref 101–111)
GFR calc Af Amer: 4 mL/min — ABNORMAL LOW (ref >60)
GFR, EST NON AFRICAN AMERICAN: 4 mL/min — AB (ref >60)
GLUCOSE: 124 mg/dL — AB (ref 65–99)
PHOSPHORUS: 4.9 mg/dL — AB (ref 2.5–4.6)
Potassium: 3.9 mmol/L (ref 3.5–5.1)
SODIUM: 135 mmol/L (ref 135–145)

## 2016-09-13 LAB — URINE CULTURE: Culture: NO GROWTH

## 2016-09-13 LAB — GLUCOSE, CAPILLARY
GLUCOSE-CAPILLARY: 154 mg/dL — AB (ref 65–99)
Glucose-Capillary: 154 mg/dL — ABNORMAL HIGH (ref 65–99)
Glucose-Capillary: 151 mg/dL — ABNORMAL HIGH (ref 65–99)

## 2016-09-13 MED ORDER — SODIUM CHLORIDE 0.9 % IV SOLN: 100.0000 mL | INTRAVENOUS | Status: DC | PRN

## 2016-09-13 MED ORDER — CINACALCET HCL 30 MG PO TABS
30.0000 mg | ORAL_TABLET | Freq: Every day | ORAL | Status: DC
  Administered 2016-09-13: 30 mg via ORAL
  Filled 2016-09-13: qty 1

## 2016-09-13 MED ORDER — VANCOMYCIN HCL IN DEXTROSE 1-5 GM/200ML-% IV SOLN
INTRAVENOUS | Status: AC
  Administered 2016-09-13: 1000 mg via INTRAVENOUS
  Filled 2016-09-13: qty 200

## 2016-09-13 MED ORDER — LIDOCAINE-PRILOCAINE 2.5-2.5 % EX CREA: 1.0000 "application " | TOPICAL_CREAM | CUTANEOUS | Status: DC | PRN

## 2016-09-13 MED ORDER — HEPARIN SODIUM (PORCINE) 1000 UNIT/ML DIALYSIS: 1000.0000 [IU] | INTRAMUSCULAR | Status: DC | PRN

## 2016-09-13 MED ORDER — LIDOCAINE HCL (PF) 1 % IJ SOLN: 5.0000 mL | INTRAMUSCULAR | Status: DC | PRN

## 2016-09-13 MED ORDER — RENA-VITE PO TABS
1.0000 | ORAL_TABLET | Freq: Every day | ORAL | Status: DC
  Administered 2016-09-13: 1 via ORAL
  Filled 2016-09-13: qty 1

## 2016-09-13 MED ORDER — DEXTROSE 5 % IV SOLN: 2.0000 g | INTRAVENOUS | Status: DC

## 2016-09-13 MED ORDER — DEXTROSE 5 % IV SOLN
2.0000 g | INTRAVENOUS | Status: DC
  Administered 2016-09-13: 2 g via INTRAVENOUS
  Filled 2016-09-13: qty 2

## 2016-09-13 MED ORDER — NEPRO/CARBSTEADY PO LIQD
237.0000 mL | Freq: Two times a day (BID) | ORAL | Status: DC
  Administered 2016-09-13 – 2016-09-14 (×4): 237 mL via ORAL

## 2016-09-13 MED ORDER — PENTAFLUOROPROP-TETRAFLUOROETH EX AERO: 1.0000 "application " | INHALATION_SPRAY | CUTANEOUS | Status: DC | PRN

## 2016-09-13 MED ORDER — HEPARIN SODIUM (PORCINE) 1000 UNIT/ML DIALYSIS
6000.0000 [IU] | INTRAMUSCULAR | Status: DC | PRN
  Filled 2016-09-13: qty 6

## 2016-09-13 MED ORDER — ALTEPLASE 2 MG IJ SOLR: 2.0000 mg | Freq: Once | INTRAMUSCULAR | Status: DC | PRN

## 2016-09-13 NOTE — Progress Notes (Signed)
Pharmacy Antibiotic Note  [name]. is a 65 y.o. male with ESRD on HD MWF admitted on 09/11/2016 with chest and abdominal pain, fevers, and fatigue. Skipped Friday HD and had abbreviated session on Saturdday 9/16 (3hr of 4h 82min HD session)  d/t feeling poorly. Noted worsening DM foot wound, undergoing workup for osteomyelitis.  Pharmacy consulted for ceftazidime and vancomycin  dosing for osteomyelitis right foot. .   Plan:  Vancomycin 1g qHD qMWF Ceftazidime 2gm qHD qMWF Monitor pre HD vancomycin level per protocol , f/u cultures.   Height: 6\' 1"  (185.4 cm) Weight: 208 lb 5.4 oz (94.5 kg) IBW/kg (Calculated) : 79.9  Temp (24hrs), Avg:98.9 F (37.2 C), Min:98.3 F (36.8 C), Max:99.7 F (37.6 C)   Recent Labs Lab 09/11/16 1449 09/11/16 1500 09/11/16 1610 09/11/16 1812 09/11/16 1813 09/11/16 2223 09/12/16 0400 09/13/16 0744  WBC  --   --  3.6*  --   --  5.6 5.0 5.5  CREATININE 8.24*  --   --  8.50*  --  9.49* 10.11* 12.69*  LATICACIDVEN  --  1.33  --   --  1.01  --   --   --     Estimated Creatinine Clearance: 6.6 mL/min (by C-G formula based on SCr of 12.69 mg/dL (H)).    Allergies  Allergen Reactions  . Penicillins Hives and Swelling    Ankle swelling Has patient had a PCN reaction causing immediate rash, facial/tongue/throat swelling, SOB or lightheadedness with hypotension: yes Has patient had a PCN reaction causing severe rash involving mucus membranes or skin necrosis: no Has patient had a PCN reaction that required hospitalization: no Has patient had a PCN reaction occurring within the last 10 years: no If all of the above answers are "NO", then may proceed with Cephalosporin use.   Antimicrobials this admission: 9/16 Cefepime >>9/18 9/16 Vancomycin>> 9/18 Ceftazidime>>  Microbiology results: 9/16 BCx: sent  Thank you for allowing pharmacy to be a part of this patient's care.  Georga Bora, PharmD Clinical Pharmacist Pager:  [phone] 09/13/2016 4:42 PM

## 2016-09-13 NOTE — Consult Note (Signed)
[facility]  Date of Admission:  09/11/2016  Date of Consult:  09/13/2016  Reason for Consult:  Diabetic Foot  Referring Physician: Dr. 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    Thank you so much for this interesting consult,   [name] MANGRUM Sr. is an 65 y.o. male.   HPI:  Admitted on 09/11/16 with fever and weakness.  He has a PMH of ESRD (2015) on MWF HD, DM type 2 (1990s), diabetic neuropathy, HTN, PVD, PAD, anemia of chronic disease, cardiomyopathy, and systolic heart failure.  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  Last reports taking antiobotics for his foot either in May or June of 2017.  He reports the drainage changed in his right foot about 3 weeks ago and that the wound has become deeper. Denies any foul smell from wound.  He reports no pain with wound secondary to severe DM neuropathy.  Around 4-5 days prior to admission he started having low grade temps, nausea, abdominal pain, dry cough, and  generalized weakness.  Denied change/frequency in stool (normally has some degree of diarrhea), vomiting, headaches, and myalgias.  While he was at hemodialysis on 9/16, he was taken off early secondary to hypotension and sent to the ER for further evaluation.  On admission, temp 100.4, BP 100/57, HR 96,  Sp02 97%.  WBC 5.6, sed rate 70, lactic acid 1.6, hemoglobin A1C 7.7.  CXR negative. BC x2 pending  He was started on empiric IV vancomycin and cefepime 9/16. MRI of right foot on 9/18 showed acute osteomyelitis throughout the great toe, sesamoid bones, and plantar aspect of the head of the first metatarsal; fluid in the sheath of flexor tendon of great toe worrisome for septic tenosynovitis; first MTP joint effusion worrisome for septic joint.  Orthopedic surgery consulted. ID consulted for recommendations of prolonged antibiotic therapy vs surgery moving forward.     Past Medical History:  Diagnosis Date  . Cardiomyopathy ([place])   . Diabetes mellitus   . Diabetic foot ulcer ([employer])   . Diabetic peripheral neuropathy ([place])   . ESRD (end stage renal disease) ([place])   . HTN (hypertension)   . Osteomyelitis ([place])   . Peripheral vascular disease ([place])    critical limb ischemia    Past Surgical History:  Procedure Laterality Date  . AV FISTULA PLACEMENT Left 11/27/2014   Procedure: ARTERIOVENOUS (AV) FISTULA CREATION;  Surgeon: Conrad [place], MD;  Location: [facility];  Service: Vascular;  Laterality: Left;  . CHOLECYSTECTOMY    . COLONOSCOPY N/A 11/28/2014   Procedure: COLONOSCOPY;  Surgeon: Ladene Artist, MD;  Location: [facility] ENDOSCOPY;  Service: Endoscopy;  Laterality: N/A;  . ESOPHAGOGASTRODUODENOSCOPY N/A 11/28/2014   Procedure: ESOPHAGOGASTRODUODENOSCOPY (EGD);  Surgeon: Ladene Artist, MD;  Location: [facility] ENDOSCOPY;  Service: Endoscopy;  Laterality: N/A;  .  I&D EXTREMITY  01/28/2012   Procedure: IRRIGATION AND DEBRIDEMENT EXTREMITY;  Surgeon: William M Gramig III, MD;  Location: [facility] ORS;  Service: Orthopedics;  Laterality: Left;  irrigation and drainage left hand  . I&D EXTREMITY  01/30/2012   Procedure: IRRIGATION AND DEBRIDEMENT EXTREMITY;  Surgeon: William M Gramig III, MD;  Location: [facility] ORS;  Service: Orthopedics;  Laterality: Left;  flexor teno synovectomy and extensor tenosynovectomy  arthrosynovectomy wristjoint  . I&D EXTREMITY  02/01/2012   Procedure: IRRIGATION AND DEBRIDEMENT EXTREMITY;  Surgeon: William M Gramig III, MD;  Location: [facility] ORS;  Service: Orthopedics;  Laterality: Left;  Left Wrist, Hand and Forearm  . Left 4th toe amputation    . LEFT AND RIGHT HEART CATHETERIZATION WITH CORONARY ANGIOGRAM N/A 11/26/2014   Procedure: LEFT AND RIGHT HEART CATHETERIZATION WITH CORONARY ANGIOGRAM;  Surgeon: David W Harding, MD;  Location: [facility] CATH LAB;  Service: Cardiovascular;  Laterality: N/A;  . TONSILLECTOMY       Allergies  Allergen Reactions  . Penicillins Hives and Swelling    Ankle swelling Has patient had a PCN reaction causing immediate rash, facial/tongue/throat swelling, SOB or lightheadedness with hypotension: yes Has patient had a PCN reaction causing severe rash involving mucus membranes or skin necrosis: no Has patient had a PCN reaction that required hospitalization: no Has patient had a PCN reaction occurring within the last 10 years: no If all of the above answers are "NO", then may proceed with Cephalosporin use.    Medications: I have reviewed the patient's current medications.  Abtx:  Anti-infectives    Start     Dose/Rate Route Frequency Ordered Stop   09/13/16 2000  ceFEPIme (MAXIPIME) 2 g in dextrose 5 % 50 mL IVPB     2 g 100 mL/hr over 30 Minutes Intravenous Every M-W-F (2000) 09/13/16 1009     09/13/16 1200  vancomycin (VANCOCIN) IVPB 1000 mg/200 mL premix     1,000 mg 200 mL/hr over 60 Minutes Intravenous Every M-W-F (Hemodialysis) 09/12/16 1548     09/13/16 1200  ceFEPIme (MAXIPIME) 2 g in dextrose 5 % 50 mL IVPB  Status:  Discontinued     2 g 100 mL/hr over 30 Minutes Intravenous Every M-W-F (Hemodialysis) 09/12/16 1548 09/13/16 1009   09/11/16 1615  ceFEPIme (MAXIPIME) 2 g in dextrose 5 % 50 mL IVPB     2 g 100 mL/hr over 30 Minutes Intravenous  Once 09/11/16 1602 09/11/16 1637   09/11/16 1600  vancomycin (VANCOCIN) 1,500 mg in sodium  chloride 0.9 % 500 mL IVPB     1,500 mg 250 mL/hr over 120 Minutes Intravenous  Once 09/11/16 1521 09/11/16 1923      Total days of antibiotics: 2          Social History:  reports that he has quit smoking. His smoking use included Cigarettes. He has a 2.00 pack-year smoking history. He has never used smokeless tobacco. He reports that he does not drink alcohol or use drugs.  Family History  Problem Relation Age of Onset  . Heart disease Mother   . Diabetes Father   . Diabetes Brother   . Diabetes Sister   . Diabetes Brother   . Diabetes Brother     General ROS: positive for  - chills, fatigue, malaise and nausea  Blood pressure 127/64, pulse 73, temperature 98.8 F (37.1 C), temperature source Oral, resp. rate 16, height 6' 1" (1.854 m), weight 94.5 kg (208 lb 5.4 oz), SpO2 94 %. 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 Unable to palpate pulses on R foot.   Results for orders placed or performed during the hospital encounter of 09/11/16 (from the past 48 hour(s))  Comprehensive metabolic panel     Status: Abnormal   Collection Time: 09/11/16  2:49 PM  Result Value Ref Range   Sodium 135 135 - 145 mmol/L   Potassium 4.2 3.5 - 5.1 mmol/L   Chloride 93 (L) 101 - 111 mmol/L   CO2 29 22 - 32 mmol/L   Glucose, Bld 208 (H) 65 - 99 mg/dL   BUN 21 (H) 6 - 20 mg/dL   Creatinine, Ser 8.24 (H) 0.61 - 1.24 mg/dL   Calcium 8.7 (L) 8.9 - 10.3 mg/dL   Total Protein 8.6 (H) 6.5 - 8.1 g/dL   Albumin 3.6 3.5 - 5.0 g/dL   AST 18 15 - 41 U/L   ALT 13  (L) 17 - 63 U/L   Alkaline Phosphatase 56 38 - 126 U/L   Total Bilirubin 0.5 0.3 - 1.2 mg/dL   GFR calc non Af Amer 6 (L) >60 mL/min   GFR calc Af Amer 7 (L) >60 mL/min    Comment: (NOTE) The eGFR has been calculated using the CKD EPI equation. This calculation has not been validated in all clinical situations. eGFR's persistently <60 mL/min signify possible Chronic Kidney Disease.    Anion gap 13 5 - 15  Culture, blood (Routine X 2)     Status: None (Preliminary result)   Collection Time: 09/11/16  2:49 PM  Result Value Ref Range   Specimen Description BLOOD RIGHT ANTECUBITAL    Special Requests BOTTLES DRAWN AEROBIC AND ANAEROBIC 5CC    Culture      NO GROWTH < 24 HOURS Performed at [facility]    Report Status PENDING   I-stat troponin, ED     Status: Abnormal   Collection Time: 09/11/16  2:59 PM  Result Value Ref Range   Troponin i, poc 0.10 (HH) 0.00 - 0.08 ng/mL   Comment NOTIFIED PHYSICIAN    Comment 3            Comment: Due to the release kinetics of cTnI, a negative result within the first hours of the onset of symptoms does not rule out myocardial infarction with certainty. If myocardial infarction is still suspected, repeat the test at appropriate intervals.   I-Stat CG4 Lactic Acid, ED     Status: None   Collection Time: 09/11/16  3:00 PM  Result Value Ref Range   Lactic Acid, Venous 1.33 0.5 - 1.9 mmol/L  Culture, blood (Routine X 2)     Status: None (Preliminary result)   Collection Time: 09/11/16  3:55 PM  Result Value Ref Range   Specimen Description BLOOD RIGHT WRIST    Special Requests BOTTLES DRAWN AEROBIC AND ANAEROBIC 4CC    Culture      NO GROWTH < 24 HOURS Performed at [facility]    Report Status PENDING   CBC with Differential     Status: Abnormal   Collection  Time: 09/11/16  4:10 PM  Result Value Ref Range   WBC 3.6 (L) 4.0 - 10.5 K/uL   RBC 4.21 (L) 4.22 - 5.81 MIL/uL   Hemoglobin 13.3 13.0 - 17.0 g/dL   HCT 40.8 39.0  - 52.0 %   MCV 96.9 78.0 - 100.0 fL   MCH 31.6 26.0 - 34.0 pg   MCHC 32.6 30.0 - 36.0 g/dL   RDW 15.0 11.5 - 15.5 %   Platelets 289 150 - 400 K/uL   Neutrophils Relative % 67 %   Lymphocytes Relative 21 %   Monocytes Relative 10 %   Eosinophils Relative 2 %   Basophils Relative 0 %   Neutro Abs 2.3 1.7 - 7.7 K/uL   Lymphs Abs 0.8 0.7 - 4.0 K/uL   Monocytes Absolute 0.4 0.1 - 1.0 K/uL   Eosinophils Absolute 0.1 0.0 - 0.7 K/uL   Basophils Absolute 0.0 0.0 - 0.1 K/uL   RBC Morphology POLYCHROMASIA PRESENT   I-Stat Chem 8, ED     Status: Abnormal   Collection Time: 09/11/16  6:12 PM  Result Value Ref Range   Sodium 135 135 - 145 mmol/L   Potassium 6.4 (HH) 3.5 - 5.1 mmol/L   Chloride 95 (L) 101 - 111 mmol/L   BUN 32 (H) 6 - 20 mg/dL   Creatinine, Ser 8.50 (H) 0.61 - 1.24 mg/dL   Glucose, Bld 162 (H) 65 - 99 mg/dL   Calcium, Ion 0.89 (LL) 1.15 - 1.40 mmol/L   TCO2 31 0 - 100 mmol/L   Hemoglobin 14.3 13.0 - 17.0 g/dL   HCT 42.0 39.0 - 52.0 %   Comment NOTIFIED PHYSICIAN   I-Stat CG4 Lactic Acid, ED     Status: None   Collection Time: 09/11/16  6:13 PM  Result Value Ref Range   Lactic Acid, Venous 1.01 0.5 - 1.9 mmol/L  Urinalysis, Routine w reflex microscopic     Status: Abnormal   Collection Time: 09/11/16  7:33 PM  Result Value Ref Range   Color, Urine YELLOW YELLOW   APPearance CLEAR CLEAR   Specific Gravity, Urine 1.015 1.005 - 1.030   pH 8.5 (H) 5.0 - 8.0   Glucose, UA 100 (A) NEGATIVE mg/dL   Hgb urine dipstick NEGATIVE NEGATIVE   Bilirubin Urine SMALL (A) NEGATIVE   Ketones, ur NEGATIVE NEGATIVE mg/dL   Protein, ur 100 (A) NEGATIVE mg/dL   Nitrite NEGATIVE NEGATIVE   Leukocytes, UA NEGATIVE NEGATIVE  Urine microscopic-add on     Status: Abnormal   Collection Time: 09/11/16  7:33 PM  Result Value Ref Range   Squamous Epithelial / LPF 0-5 (A) NONE SEEN   WBC, UA 0-5 0 - 5 WBC/hpf   RBC / HPF 0-5 0 - 5 RBC/hpf   Bacteria, UA RARE (A) NONE SEEN  Urine culture      Status: None   Collection Time: 09/11/16  7:34 PM  Result Value Ref Range   Specimen Description URINE, CLEAN CATCH    Special Requests NONE    Culture NO GROWTH Performed at [facility]     Report Status 09/13/2016 FINAL   Glucose, capillary     Status: Abnormal   Collection Time: 09/11/16  9:51 PM  Result Value Ref Range   Glucose-Capillary 168 (H) 65 - 99 mg/dL  CBC     Status: Abnormal   Collection Time: 09/11/16 10:23 PM  Result Value Ref Range   WBC 5.6 4.0 - 10.5 K/uL  RBC 3.94 (L) 4.22 - 5.81 MIL/uL   Hemoglobin 12.1 (L) 13.0 - 17.0 g/dL   HCT 38.5 (L) 39.0 - 52.0 %   MCV 97.7 78.0 - 100.0 fL   MCH 30.7 26.0 - 34.0 pg   MCHC 31.4 30.0 - 36.0 g/dL   RDW 14.8 11.5 - 15.5 %   Platelets 261 150 - 400 K/uL  Creatinine, serum     Status: Abnormal   Collection Time: 09/11/16 10:23 PM  Result Value Ref Range   Creatinine, Ser 9.49 (H) 0.61 - 1.24 mg/dL   GFR calc non Af Amer 5 (L) >60 mL/min   GFR calc Af Amer 6 (L) >60 mL/min    Comment: (NOTE) The eGFR has been calculated using the CKD EPI equation. This calculation has not been validated in all clinical situations. eGFR's persistently <60 mL/min signify possible Chronic Kidney Disease.   Hemoglobin A1c     Status: Abnormal   Collection Time: 09/11/16 10:23 PM  Result Value Ref Range   Hgb A1c MFr Bld 7.7 (H) 4.8 - 5.6 %    Comment: (NOTE)         Pre-diabetes: 5.7 - 6.4         Diabetes: >6.4         Glycemic control for adults with diabetes: <7.0    Mean Plasma Glucose 174 mg/dL    Comment: (NOTE) Performed At: [facility] [place], [place] [id number] Lindon Romp MD YD:[id number]   Sedimentation rate     Status: Abnormal   Collection Time: 09/11/16 10:23 PM  Result Value Ref Range   Sed Rate 70 (H) 0 - 16 mm/hr  CBC     Status: Abnormal   Collection Time: 09/12/16  4:00 AM  Result Value Ref Range   WBC 5.0 4.0 - 10.5 K/uL   RBC 3.83 (L) 4.22 - 5.81 MIL/uL    Hemoglobin 11.8 (L) 13.0 - 17.0 g/dL   HCT 38.1 (L) 39.0 - 52.0 %   MCV 99.5 78.0 - 100.0 fL   MCH 30.8 26.0 - 34.0 pg   MCHC 31.0 30.0 - 36.0 g/dL   RDW 15.1 11.5 - 15.5 %   Platelets 287 150 - 400 K/uL  Renal function panel     Status: Abnormal   Collection Time: 09/12/16  4:00 AM  Result Value Ref Range   Sodium 137 135 - 145 mmol/L   Potassium 4.5 3.5 - 5.1 mmol/L   Chloride 96 (L) 101 - 111 mmol/L   CO2 30 22 - 32 mmol/L   Glucose, Bld 148 (H) 65 - 99 mg/dL   BUN 24 (H) 6 - 20 mg/dL   Creatinine, Ser 10.11 (H) 0.61 - 1.24 mg/dL   Calcium 8.0 (L) 8.9 - 10.3 mg/dL   Phosphorus 5.0 (H) 2.5 - 4.6 mg/dL   Albumin 2.6 (L) 3.5 - 5.0 g/dL   GFR calc non Af Amer 5 (L) >60 mL/min   GFR calc Af Amer 5 (L) >60 mL/min    Comment: (NOTE) The eGFR has been calculated using the CKD EPI equation. This calculation has not been validated in all clinical situations. eGFR's persistently <60 mL/min signify possible Chronic Kidney Disease.    Anion gap 11 5 - 15  MRSA PCR Screening     Status: None   Collection Time: 09/12/16  4:21 AM  Result Value Ref Range   MRSA by PCR NEGATIVE NEGATIVE    Comment:  The GeneXpert MRSA Assay (FDA approved for NASAL specimens only), is one component of a comprehensive MRSA colonization surveillance program. It is not intended to diagnose MRSA infection nor to guide or monitor treatment for MRSA infections.   Glucose, capillary     Status: Abnormal   Collection Time: 09/12/16  8:06 AM  Result Value Ref Range   Glucose-Capillary 127 (H) 65 - 99 mg/dL  Glucose, capillary     Status: Abnormal   Collection Time: 09/12/16 11:59 AM  Result Value Ref Range   Glucose-Capillary 121 (H) 65 - 99 mg/dL  Glucose, capillary     Status: Abnormal   Collection Time: 09/12/16  4:27 PM  Result Value Ref Range   Glucose-Capillary 137 (H) 65 - 99 mg/dL  Glucose, capillary     Status: Abnormal   Collection Time: 09/12/16 10:23 PM  Result Value Ref Range    Glucose-Capillary 154 (H) 65 - 99 mg/dL  CBC     Status: Abnormal   Collection Time: 09/13/16  7:44 AM  Result Value Ref Range   WBC 5.5 4.0 - 10.5 K/uL   RBC 3.81 (L) 4.22 - 5.81 MIL/uL   Hemoglobin 12.0 (L) 13.0 - 17.0 g/dL   HCT 37.3 (L) 39.0 - 52.0 %   MCV 97.9 78.0 - 100.0 fL   MCH 31.5 26.0 - 34.0 pg   MCHC 32.2 30.0 - 36.0 g/dL   RDW 15.2 11.5 - 15.5 %   Platelets 284 150 - 400 K/uL  Renal function panel     Status: Abnormal   Collection Time: 09/13/16  7:44 AM  Result Value Ref Range   Sodium 135 135 - 145 mmol/L   Potassium 3.9 3.5 - 5.1 mmol/L   Chloride 99 (L) 101 - 111 mmol/L   CO2 22 22 - 32 mmol/L   Glucose, Bld 124 (H) 65 - 99 mg/dL   BUN 31 (H) 6 - 20 mg/dL   Creatinine, Ser 12.69 (H) 0.61 - 1.24 mg/dL   Calcium 7.9 (L) 8.9 - 10.3 mg/dL   Phosphorus 4.9 (H) 2.5 - 4.6 mg/dL   Albumin 2.7 (L) 3.5 - 5.0 g/dL   GFR calc non Af Amer 4 (L) >60 mL/min   GFR calc Af Amer 4 (L) >60 mL/min    Comment: (NOTE) The eGFR has been calculated using the CKD EPI equation. This calculation has not been validated in all clinical situations. eGFR's persistently <60 mL/min signify possible Chronic Kidney Disease.    Anion gap 14 5 - 15  Glucose, capillary     Status: Abnormal   Collection Time: 09/13/16 12:30 PM  Result Value Ref Range   Glucose-Capillary 154 (H) 65 - 99 mg/dL      Component Value Date/Time   SDES URINE, CLEAN CATCH 09/11/2016 1934   SPECREQUEST NONE 09/11/2016 1934   CULT NO GROWTH Performed at [facility]  09/11/2016 1934   REPTSTATUS 09/13/2016 FINAL 09/11/2016 1934   Dg Chest 2 View  Result Date: 09/11/2016 CLINICAL DATA:  Left lower anterior chest pain. EXAM: CHEST  2 VIEW COMPARISON:  11/22/2014 FINDINGS: The heart size and mediastinal contours are within normal limits. There is no evidence of pulmonary edema, consolidation, pneumothorax, nodule or pleural fluid. The visualized skeletal structures are unremarkable. IMPRESSION: No active  cardiopulmonary disease. Electronically Signed   By: Glenn  Yamagata M.D.   On: 09/11/2016 14:28   Mr Foot Right Wo Contrast  Result Date: 09/13/2016 CLINICAL DATA:  Nonhealing wound plantar soft tissues   near the first and second MTP joints for 10 months. Diabetic patient. Subsequent encounter. EXAM: MRI OF THE RIGHT FOREFOOT WITHOUT CONTRAST TECHNIQUE: Multiplanar, multisequence MR imaging was performed. No intravenous contrast was administered. COMPARISON:  Plain films right foot 09/11/2016. MRI right foot 06/14/2016. FINDINGS: A plantar soft tissue wound is identified at the level of the first MTP joint. Intermediate increased T2 signal is seen in proximal and distal phalanges of the great toe, the sesamoid bones of the great toes and in the plantar aspect of the head of the first metatarsal, new since the prior MRI. A new moderate first MTP joint effusion is identified. Fluid is seen in the sheath of the flexor tendon of the great toe to the level of the mid first metatarsal. Bone marrow signal is otherwise unremarkable. Amputation at the level of the distal fifth metatarsal is noted. IMPRESSION: Findings most consistent with acute osteomyelitis throughout the great toe, sesamoid bones and plantar aspect of the head of the first metatarsal. Fluid in the sheath of the flexor tendon of the great toe could be sympathetic but is worrisome for septic tenosynovitis. First MTP joint effusion worrisome for septic joint. Electronically Signed   By: Thomas  Dalessio M.D.   On: 09/13/2016 07:31   Dg Foot Complete Right  Result Date: 09/11/2016 CLINICAL DATA:  Nonhealing wound at plantar aspect of RIGHT foot near the distal first and second metatarsals, wound present for 10 months weeping clear fluid, history diabetes mellitus, amputation of little total 1 year ago, hypertension, end-stage renal disease, cardiomyopathy EXAM: RIGHT FOOT COMPLETE - 3+ VIEW COMPARISON:  05/18/2016 FINDINGS: Osseous demineralization.  Prior amputation of the fifth toe through the level of the distal fifth metatarsal. Joint spaces preserved. Toes flexed limiting assessment. No definite fracture, dislocation, or bone destruction. Extensive small vessel vascular calcification. No definite soft tissue gas identified. Mild soft tissue swelling of forefoot. IMPRESSION: Osseous demineralization with evidence of prior fifth toe amputation. No definite acute bony abnormalities. If there is persistent clinical concern for osteomyelitis, consider MR. Electronically Signed   By: Mark  Boles M.D.   On: 09/11/2016 16:36   Recent Results (from the past 240 hour(s))  Culture, blood (Routine X 2)     Status: None (Preliminary result)   Collection Time: 09/11/16  2:49 PM  Result Value Ref Range Status   Specimen Description BLOOD RIGHT ANTECUBITAL  Final   Special Requests BOTTLES DRAWN AEROBIC AND ANAEROBIC 5CC  Final   Culture   Final    NO GROWTH < 24 HOURS Performed at [facility]    Report Status PENDING  Incomplete  Culture, blood (Routine X 2)     Status: None (Preliminary result)   Collection Time: 09/11/16  3:55 PM  Result Value Ref Range Status   Specimen Description BLOOD RIGHT WRIST  Final   Special Requests BOTTLES DRAWN AEROBIC AND ANAEROBIC 4CC  Final   Culture   Final    NO GROWTH < 24 HOURS Performed at [place] [facility]    Report Status PENDING  Incomplete  Urine culture     Status: None   Collection Time: 09/11/16  7:34 PM  Result Value Ref Range Status   Specimen Description URINE, CLEAN CATCH  Final   Special Requests NONE  Final   Culture NO GROWTH Performed at [facility]   Final   Report Status 09/13/2016 FINAL  Final  MRSA PCR Screening     Status: None   Collection Time: 09/12/16    4:21 AM  Result Value Ref Range Status   MRSA by PCR NEGATIVE NEGATIVE Final    Comment:        The GeneXpert MRSA Assay (FDA approved for NASAL specimens only), is one component of a comprehensive MRSA  colonization surveillance program. It is not intended to diagnose MRSA infection nor to guide or monitor treatment for MRSA infections.       09/13/2016, 1:37 PM     LOS: 2 days    Records and images were personally review 

## 2016-09-13 NOTE — Progress Notes (Signed)
PROGRESS NOTE    [name] GUZZETTA Sr.  QJJ:[id number] DOB: [birth year] DOA: 09/11/2016 PCP: [name], MD    Brief Narrative: [name] Sages Sr. is a 65 y.o. 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   Consulted orthopedic surgeon who is recommending prolonged IV antibiotic therapy prior to considering amputation. He did however recommend dietary child to infectious disease and have them weigh in.  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 - Consulted orthopedic surgeon of note patient wants to be see by [facility] moving forward. Should infectious disease feeling inclined that patient should get amputation I will consult [facility] - Did however sphincter orthopedic surgeon who is recommending prolonged IV antibiotics. - Consulted infectious disease for recommendations regarding prolonged antibiotic therapy moving forward   Anemia from ESRD: Stable.   ESRD ON HD MWF. Further recommendations as per renal.   Type 2 DM: CBG (last 3)   Recent Labs  09/12/16 1627 09/12/16 2223 09/13/16 1230  GLUCAP 137* 154* 154*    Resume SSI.        DVT prophylaxis: none at bedside.  Code Status: full code. Family Communication: Discussed directly with patient Disposition Plan:  pending. Further evaluation.    Consultants:   Orthopedic surgery  Infectious disease    Procedures: MRI FOOT     Antimicrobials: vancomycin and cefepime.    Subjective: Patient has no new complaints. Updated him regarding MRI results  Objective: Vitals:   09/13/16 1130 09/13/16 1145 09/13/16 1203 09/13/16 1233  BP: (!) 143/70 (!) 142/67 (!) 128/44 127/64  Pulse: 76 83 78 73  Resp:   14 16  Temp:   98.7 F (37.1 C) 98.8 F (37.1 C)  TempSrc:   Oral Oral  SpO2:   98% 94%  Weight:   94.5 kg (208 lb 5.4 oz)   Height:        Intake/Output Summary (Last 24 hours) at 09/13/16 1320 Last data filed at 09/13/16 1203  Gross per 24 hour  Intake              360 ml  Output             1424 ml  Net            -1064 ml   Filed Weights   09/12/16 2223 09/13/16 0731 09/13/16 1203  Weight: 95.2 kg (209 lb 14.1 oz) 96.3 kg (212 lb 4.9 oz) 94.5 kg (208 lb 5.4 oz)    Examination:  General exam: Appears calm and comfortable , In no acute distress Respiratory system: Clear to auscultation. Respiratory effort normal. Cardiovascular system: S1 & S2 heard, RRR. No JVD, murmurs, rubs, gallops or clicks. No pedal edema. Gastrointestinal system: Abdomen is nondistended, soft and nontender. No organomegaly or masses felt. Normal bowel sounds heard. Central nervous  system: Alert and oriented. No focal neurological deficits. Extremities: Right foot ulcer draining. Left foot contracture.    Data Reviewed: I have personally reviewed following labs and imaging studies  CBC:  Recent Labs Lab 09/11/16 1610 09/11/16 1812 09/11/16 2223 09/12/16 0400 09/13/16 0744  WBC 3.6*  --  5.6 5.0 5.5  NEUTROABS 2.3  --   --   --   --   HGB 13.3 14.3 12.1* 11.8* 12.0*  HCT 40.8 42.0 38.5* 38.1* 37.3*  MCV 96.9  --  97.7 99.5 97.9  PLT 289  --  261 287 756   Basic Metabolic Panel:  Recent Labs Lab 09/11/16 1449 09/11/16 1812 09/11/16 2223 09/12/16 0400 09/13/16 0744  NA 135 135  --   137 135  K 4.2 6.4*  --  4.5 3.9  CL 93* 95*  --  96* 99*  CO2 29  --   --  30 22  GLUCOSE 208* 162*  --  148* 124*  BUN 21* 32*  --  24* 31*  CREATININE 8.24* 8.50* 9.49* 10.11* 12.69*  CALCIUM 8.7*  --   --  8.0* 7.9*  PHOS  --   --   --  5.0* 4.9*   GFR: Estimated Creatinine Clearance: 6.6 mL/min (by C-G formula based on SCr of 12.69 mg/dL (H)). Liver Function Tests:  Recent Labs Lab 09/11/16 1449 09/12/16 0400 09/13/16 0744  AST 18  --   --   ALT 13*  --   --   ALKPHOS 56  --   --   BILITOT 0.5  --   --   PROT 8.6*  --   --   ALBUMIN 3.6 2.6* 2.7*   No results for input(s): LIPASE, AMYLASE in the last 168 hours. No results for input(s): AMMONIA in the last 168 hours. Coagulation Profile: No results for input(s): INR, PROTIME in the last 168 hours. Cardiac Enzymes: No results for input(s): CKTOTAL, CKMB, CKMBINDEX, TROPONINI in the last 168 hours. BNP (last 3 results) No results for input(s): PROBNP in the last 8760 hours. HbA1C:  Recent Labs  09/11/16 2223  HGBA1C 7.7*   CBG:  Recent Labs Lab 09/12/16 0806 09/12/16 1159 09/12/16 1627 09/12/16 2223 09/13/16 1230  GLUCAP 127* 121* 137* 154* 154*   Lipid Profile: No results for input(s): CHOL, HDL, LDLCALC, TRIG, CHOLHDL, LDLDIRECT in the last 72 hours. Thyroid Function Tests: No results for input(s): TSH, T4TOTAL, FREET4, T3FREE, THYROIDAB in the last 72 hours. Anemia Panel: No results for input(s): VITAMINB12, FOLATE, FERRITIN, TIBC, IRON, RETICCTPCT in the last 72 hours. Sepsis Labs:  Recent Labs Lab 09/11/16 1500 09/11/16 1813  LATICACIDVEN 1.33 1.01    Recent Results (from the past 240 hour(s))  Culture, blood (Routine X 2)     Status: None (Preliminary result)   Collection Time: 09/11/16  2:49 PM  Result Value Ref Range Status   Specimen Description BLOOD RIGHT ANTECUBITAL  Final   Special Requests BOTTLES DRAWN AEROBIC AND ANAEROBIC 5CC  Final   Culture   Final    NO GROWTH < 24  HOURS Performed at [facility]    Report Status PENDING  Incomplete  Culture, blood (Routine X 2)     Status: None (Preliminary result)   Collection Time: 09/11/16  3:55 PM  Result Value Ref Range Status   Specimen Description BLOOD RIGHT WRIST  Final   Special Requests BOTTLES DRAWN AEROBIC AND ANAEROBIC 4CC  Final   Culture   Final  NO GROWTH < 24 HOURS Performed at [facility]    Report Status PENDING  Incomplete  Urine culture     Status: None   Collection Time: 09/11/16  7:34 PM  Result Value Ref Range Status   Specimen Description URINE, CLEAN CATCH  Final   Special Requests NONE  Final   Culture NO GROWTH Performed at [facility]   Final   Report Status 09/13/2016 FINAL  Final  MRSA PCR Screening     Status: None   Collection Time: 09/12/16  4:21 AM  Result Value Ref Range Status   MRSA by PCR NEGATIVE NEGATIVE Final    Comment:        The GeneXpert MRSA Assay (FDA approved for NASAL specimens only), is one component of a comprehensive MRSA colonization surveillance program. It is not intended to diagnose MRSA infection nor to guide or monitor treatment for MRSA infections.          Radiology Studies: Dg Chest 2 View  Result Date: 09/11/2016 CLINICAL DATA:  Left lower anterior chest pain. EXAM: CHEST  2 VIEW COMPARISON:  11/22/2014 FINDINGS: The heart size and mediastinal contours are within normal limits. There is no evidence of pulmonary edema, consolidation, pneumothorax, nodule or pleural fluid. The visualized skeletal structures are unremarkable. IMPRESSION: No active cardiopulmonary disease. Electronically Signed   By: Aletta Edouard M.D.   On: 09/11/2016 14:28   Mr Foot Right Wo Contrast  Result Date: 09/13/2016 CLINICAL DATA:  Nonhealing wound plantar soft tissues near the first and second MTP joints for 10 months. Diabetic patient. Subsequent encounter. EXAM: MRI OF THE RIGHT FOREFOOT WITHOUT CONTRAST TECHNIQUE:  Multiplanar, multisequence MR imaging was performed. No intravenous contrast was administered. COMPARISON:  Plain films right foot 09/11/2016. MRI right foot 06/14/2016. FINDINGS: A plantar soft tissue wound is identified at the level of the first MTP joint. Intermediate increased T2 signal is seen in proximal and distal phalanges of the great toe, the sesamoid bones of the great toes and in the plantar aspect of the head of the first metatarsal, new since the prior MRI. A new moderate first MTP joint effusion is identified. Fluid is seen in the sheath of the flexor tendon of the great toe to the level of the mid first metatarsal. Bone marrow signal is otherwise unremarkable. Amputation at the level of the distal fifth metatarsal is noted. IMPRESSION: Findings most consistent with acute osteomyelitis throughout the great toe, sesamoid bones and plantar aspect of the head of the first metatarsal. Fluid in the sheath of the flexor tendon of the great toe could be sympathetic but is worrisome for septic tenosynovitis. First MTP joint effusion worrisome for septic joint. Electronically Signed   By: Inge Rise M.D.   On: 09/13/2016 07:31   Dg Foot Complete Right  Result Date: 09/11/2016 CLINICAL DATA:  Nonhealing wound at plantar aspect of RIGHT foot near the distal first and second metatarsals, wound present for 10 months weeping clear fluid, history diabetes mellitus, amputation of little total 1 year ago, hypertension, end-stage renal disease, cardiomyopathy EXAM: RIGHT FOOT COMPLETE - 3+ VIEW COMPARISON:  05/18/2016 FINDINGS: Osseous demineralization. Prior amputation of the fifth toe through the level of the distal fifth metatarsal. Joint spaces preserved. Toes flexed limiting assessment. No definite fracture, dislocation, or bone destruction. Extensive small vessel vascular calcification. No definite soft tissue gas identified. Mild soft tissue swelling of forefoot. IMPRESSION: Osseous demineralization  with evidence of prior fifth toe amputation. No definite acute bony  abnormalities. If there is persistent clinical concern for osteomyelitis, consider MR. Electronically Signed   By: Lavonia Dana M.D.   On: 09/11/2016 16:36        Scheduled Meds: . ceFEPime (MAXIPIME) IV  2 g Intravenous Q M,W,F-2000  . cinacalcet  30 mg Oral Q supper  . feeding supplement (NEPRO CARB STEADY)  237 mL Oral BID BM  . heparin  5,000 Units Subcutaneous Q8H  . insulin aspart  0-5 Units Subcutaneous QHS  . insulin aspart  0-9 Units Subcutaneous TID WC  . insulin aspart  3 Units Subcutaneous TID WC  . multivitamin  1 tablet Oral QHS  . sodium chloride flush  3 mL Intravenous Q12H  . sucroferric oxyhydroxide  500 mg Oral TID WC  . vancomycin  1,000 mg Intravenous Q M,W,F-HD  . [START ON 09/15/2016] Vitamin D (Ergocalciferol)  50,000 Units Oral Q Wed   Continuous Infusions:    LOS: 2 days    Time spent:30 minutes.     Velvet Bathe, MD [facility] Pager [phone]  If 7PM-7AM, please contact night-coverage [email or web address] Password [email or web address] 09/13/2016, 1:20 PM

## 2016-09-13 NOTE — Consult Note (Addendum)
WOC consult requested for right foot wound.  MRI results indicate: Findings most consistent with acute osteomyelitis throughout the great toe, sesamoid bones and plantar aspect of the head of the first metatarsal. This complex medical condition is beyond [facility] scope of practice.  Please consult ortho team for further plan of care. Discussed with Dr Wendee Beavers. Please re-consult if further assistance is needed.  Thank-you,  Julien Girt MSN, [place], [place], [place], [place]

## 2016-09-13 NOTE — Progress Notes (Signed)
Initial Nutrition Assessment  DOCUMENTATION CODES:   Not applicable  INTERVENTION:  Continue Nepro Shake po BID, each supplement provides 425 kcal and 19 grams protein.  Encourage adequate PO intake.   NUTRITION DIAGNOSIS:   Increased nutrient needs related to wound healing, chronic illness as evidenced by estimated needs.  GOAL:   Patient will meet greater than or equal to 90% of their needs  MONITOR:   PO intake, Supplement acceptance, Labs, Weight trends, Skin, I & O's  REASON FOR ASSESSMENT:   Malnutrition Screening Tool    ASSESSMENT:   65 y.o. 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   Pt reports appetite has been improving. Meal completion 25-50%. Pt reports eating well PTA with usual consumption of at least 3 meals a day. No weight loss per weight records. Pt currently has Nepro Shake ordered. RD to continue with current orders. Pt encouraged to eat his food at meals.   Pt with no observed significant fat or muscle mass loss.   Labs and medications reviewed. Phosphorous elevated at 4.9.  Diet Order:  Diet renal/carb modified with fluid restriction Diet-HS Snack? Nothing; Room service appropriate? Yes; Fluid consistency: Thin  Skin:  Wound (see comment) (Diabetic ulcer R foot)  Last BM:  9/16  Height:   Ht Readings from Last 1 Encounters:  09/11/16 6\' 1"  (1.854 m)    Weight:   Wt Readings from Last 1 Encounters:  09/13/16 208 lb 5.4 oz (94.5 kg)    Ideal Body Weight:  83.6 kg  BMI:  Body mass index is 27.49 kg/m.  Estimated Nutritional Needs:   Kcal:  2200-2400  Protein:  115-125 grams  Fluid:  Per  MD  EDUCATION NEEDS:   No education needs identified at this time  Corrin Parker, MS, RD, LDN Pager # [phone] After hours/ weekend pager # [phone]

## 2016-09-13 NOTE — Progress Notes (Signed)
[facility] Progress Note   Dialysis Orders: [facility] MWF 4 hours 15 minutes 425/800 94 kg 2.0K/2.25Ca  Left upper AVF Heparin 6000 units IV q treatment No ESA/No VDRA Labs: HGB 14.1 Ca 9.7 Phos 5.6 (09/08/16) PTH 68 (08/18/16)  Assessment/Plan: 1. Extensive right great toe acute osteo per MRI/septic joint - BC pending - empiric abtx- fevers down- Vanc and Maxipime with HD - he is off schedule- further plans per primary 2. ESRD  TTS - off schedule dialysis on Monday - next Wed if still here then Sat back on schedule 3. Anemia - hgb 12 - no ESA 4. Secondary hyperparathyroidism - corr Ca 8.9 - not on VDRA- last iPTH was 68 - reduce sensipar to 30 - he has been taking 90 = continue binders 5. HTN/volume - CXR clear on admission - titrate EDW 6. Nutrition - renal diet and start vits/suppl 7. DM - per primary  [name], PA-C [place] [phone] 09/13/2016,8:55 AM  LOS: 2 days   Pt seen, examined and agree w A/P as above.  Kelly Splinter MD [facility] pager [phone]    cell [phone] 09/13/2016, 10:39 AM    Subjective:   Feeling better  Objective Vitals:   09/13/16 0800 09/13/16 0815 09/13/16 0830 09/13/16 0845  BP: 138/77 134/68 123/65 138/66  Pulse: 75 72 72 76  Resp:      Temp:      TempSrc:      SpO2:      Weight:      Height:       Physical Exam General: NAD  Heart: RRR Lungs: no rales Abdomen: soft NT  Extremities: tr RLE edema Dialysis Access: left upper AVF + bruit   Additional Objective Labs: Basic Metabolic Panel:  Recent Labs Lab 09/11/16 1449 09/11/16 1812 09/11/16 2223 09/12/16 0400 09/13/16 0744  NA 135 135  --  137 135  K 4.2 6.4*  --  4.5 3.9  CL 93* 95*  --  96* 99*  CO2 29  --   --  30 22  GLUCOSE 208* 162*  --  148* 124*  BUN 21* 32*  --  24* 31*  CREATININE 8.24* 8.50* 9.49* 10.11* 12.69*  CALCIUM 8.7*  --   --  8.0* 7.9*  PHOS  --   --   --   5.0* 4.9*   Liver Function Tests:  Recent Labs Lab 09/11/16 1449 09/12/16 0400 09/13/16 0744  AST 18  --   --   ALT 13*  --   --   ALKPHOS 56  --   --   BILITOT 0.5  --   --   PROT 8.6*  --   --   ALBUMIN 3.6 2.6* 2.7*   CBC:  Recent Labs Lab 09/11/16 1610  09/11/16 2223 09/12/16 0400 09/13/16 0744  WBC 3.6*  --  5.6 5.0 5.5  NEUTROABS 2.3  --   --   --   --   HGB 13.3  < > 12.1* 11.8* 12.0*  HCT 40.8  < > 38.5* 38.1* 37.3*  MCV 96.9  --  97.7 99.5 97.9  PLT 289  --  261 287 284  < > = values in this interval not displayed. Blood Culture    Component Value Date/Time   SDES BLOOD RIGHT WRIST 09/11/2016 1555   SPECREQUEST BOTTLES DRAWN AEROBIC AND ANAEROBIC 4CC 09/11/2016 1555   CULT  09/11/2016 1555    NO GROWTH <  24 HOURS Performed at [facility] PENDING 09/11/2016 1555    CBG:  Recent Labs Lab 09/11/16 2151 09/12/16 0806 09/12/16 1159 09/12/16 1627 09/12/16 2223  GLUCAP 168* 127* 121* 137* 154*   Lab Results  Component Value Date   INR 1.33 11/27/2014   INR 1.37 11/26/2014   INR 1.18 08/10/2011   Studies/Results: Dg Chest 2 View  Result Date: 09/11/2016 CLINICAL DATA:  Left lower anterior chest pain. EXAM: CHEST  2 VIEW COMPARISON:  11/22/2014 FINDINGS: The heart size and mediastinal contours are within normal limits. There is no evidence of pulmonary edema, consolidation, pneumothorax, nodule or pleural fluid. The visualized skeletal structures are unremarkable. IMPRESSION: No active cardiopulmonary disease. Electronically Signed   By: Aletta Edouard M.D.   On: 09/11/2016 14:28   Mr Foot Right Wo Contrast  Result Date: 09/13/2016 CLINICAL DATA:  Nonhealing wound plantar soft tissues near the first and second MTP joints for 10 months. Diabetic patient. Subsequent encounter. EXAM: MRI OF THE RIGHT FOREFOOT WITHOUT CONTRAST TECHNIQUE: Multiplanar, multisequence MR imaging was performed. No intravenous contrast was administered.  COMPARISON:  Plain films right foot 09/11/2016. MRI right foot 06/14/2016. FINDINGS: A plantar soft tissue wound is identified at the level of the first MTP joint. Intermediate increased T2 signal is seen in proximal and distal phalanges of the great toe, the sesamoid bones of the great toes and in the plantar aspect of the head of the first metatarsal, new since the prior MRI. A new moderate first MTP joint effusion is identified. Fluid is seen in the sheath of the flexor tendon of the great toe to the level of the mid first metatarsal. Bone marrow signal is otherwise unremarkable. Amputation at the level of the distal fifth metatarsal is noted. IMPRESSION: Findings most consistent with acute osteomyelitis throughout the great toe, sesamoid bones and plantar aspect of the head of the first metatarsal. Fluid in the sheath of the flexor tendon of the great toe could be sympathetic but is worrisome for septic tenosynovitis. First MTP joint effusion worrisome for septic joint. Electronically Signed   By: Inge Rise M.D.   On: 09/13/2016 07:31   Dg Foot Complete Right  Result Date: 09/11/2016 CLINICAL DATA:  Nonhealing wound at plantar aspect of RIGHT foot near the distal first and second metatarsals, wound present for 10 months weeping clear fluid, history diabetes mellitus, amputation of little total 1 year ago, hypertension, end-stage renal disease, cardiomyopathy EXAM: RIGHT FOOT COMPLETE - 3+ VIEW COMPARISON:  05/18/2016 FINDINGS: Osseous demineralization. Prior amputation of the fifth toe through the level of the distal fifth metatarsal. Joint spaces preserved. Toes flexed limiting assessment. No definite fracture, dislocation, or bone destruction. Extensive small vessel vascular calcification. No definite soft tissue gas identified. Mild soft tissue swelling of forefoot. IMPRESSION: Osseous demineralization with evidence of prior fifth toe amputation. No definite acute bony abnormalities. If there is  persistent clinical concern for osteomyelitis, consider MR. Electronically Signed   By: Lavonia Dana M.D.   On: 09/11/2016 16:36   Medications:   . ceFEPime (MAXIPIME) IV  2 g Intravenous Q M,W,F-HD  . cinacalcet  90 mg Oral Q supper  . heparin  5,000 Units Subcutaneous Q8H  . insulin aspart  0-5 Units Subcutaneous QHS  . insulin aspart  0-9 Units Subcutaneous TID WC  . insulin aspart  3 Units Subcutaneous TID WC  . sodium chloride flush  3 mL Intravenous Q12H  . sucroferric oxyhydroxide  500 mg Oral  TID WC  . vancomycin  1,000 mg Intravenous Q M,W,F-HD  . [START ON 09/15/2016] Vitamin D (Ergocalciferol)  50,000 Units Oral Q Wed

## 2016-09-14 ENCOUNTER — Ambulatory Visit (HOSPITAL_COMMUNITY): Admission: RE | Admit: 2016-09-14 | Payer: Medicare Other | Source: Ambulatory Visit | Admitting: Surgery

## 2016-09-14 DIAGNOSIS — R509 Fever, unspecified: Secondary | ICD-10-CM

## 2016-09-14 LAB — GLUCOSE, CAPILLARY
Glucose-Capillary: 144 mg/dL — ABNORMAL HIGH (ref 65–99)
Glucose-Capillary: 139 mg/dL — ABNORMAL HIGH (ref 65–99)

## 2016-09-14 MED ORDER — VANCOMYCIN HCL IN DEXTROSE 1-5 GM/200ML-% IV SOLN: 1000.0000 mg | INTRAVENOUS | 0 refills | Status: DC

## 2016-09-14 MED ORDER — DEXTROSE 5 % IV SOLN: 2.0000 g | INTRAVENOUS | 0 refills | Status: AC

## 2016-09-14 MED ORDER — DEXTROSE 5 % IV SOLN: 2.0000 g | INTRAVENOUS | Status: DC

## 2016-09-14 MED ORDER — CEFTAZIDIME 2 G IJ SOLR: 2.0000 g | INTRAMUSCULAR | Status: DC

## 2016-09-14 NOTE — Progress Notes (Signed)
INFECTIOUS DISEASE PROGRESS NOTE  ID: [name] Sages Sr. is a 65 y.o. male with acute osteomyelitis of right great toe, sesamoid bones, and plantar aspect head of first metatarsal.  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 Resting quietly, awakens easily.   Tmax 99.7, no labs today.  HD 9/18.  Few chills over night.  Right foot wound with some drainage today. Overall feels better.    Abtx:  Anti-infectives    Start     Dose/Rate Route Frequency Ordered Stop   09/15/16 2000  cefTAZidime (FORTAZ) 2 g in dextrose 5 % 50 mL IVPB     2 g 100 mL/hr over 30 Minutes Intravenous Every M-W-F (2000) 09/14/16 0719     09/15/16 1200  cefTAZidime (FORTAZ) 2 g in dextrose 5 % 50 mL IVPB  Status:  Discontinued     2 g 100 mL/hr over 30 Minutes Intravenous Every M-W-F (Hemodialysis) 09/13/16 1644 09/14/16 0719   09/13/16 2000  ceFEPIme (MAXIPIME) 2 g in dextrose 5 % 50 mL IVPB  Status:  Discontinued     2 g 100 mL/hr over 30 Minutes Intravenous Every M-W-F (2000) 09/13/16 1009 09/13/16 1605   09/13/16 1200  vancomycin (VANCOCIN) IVPB 1000 mg/200 mL premix     1,000 mg 200 mL/hr over 60 Minutes Intravenous Every M-W-F (Hemodialysis) 09/12/16 1548     09/13/16 1200  ceFEPIme (MAXIPIME) 2 g in dextrose 5 % 50 mL IVPB  Status:  Discontinued     2 g 100 mL/hr over 30 Minutes Intravenous Every M-W-F (Hemodialysis) 09/12/16 1548 09/13/16 1009   09/11/16 1615  ceFEPIme (MAXIPIME) 2 g in dextrose 5 % 50 mL IVPB     2 g 100 mL/hr over 30 Minutes Intravenous  Once 09/11/16 1602 09/11/16 1637   09/11/16 1600  vancomycin (VANCOCIN) 1,500 mg in sodium chloride 0.9 % 500 mL IVPB     1,500 mg 250 mL/hr over 120 Minutes Intravenous  Once 09/11/16 1521 09/11/16 1923      Medications: I have reviewed the patient's current  medications.  Objective: Vital signs in last 24 hours: Temp:  [98.7 F (37.1 C)-99.7 F (37.6 C)] 99.7 F (37.6 C) (09/19 0446) Pulse Rate:  [69-83] 69 (09/19 0446) Resp:  [14-18] 18 (09/19 0446) BP: (127-179)/(44-85) 130/53 (09/19 0446) SpO2:  [94 %-98 %] 96 % (09/19 0446) Weight:  [94.5 kg (208 lb 5.4 oz)-94.8 kg (208 lb 15.9 oz)] 94.8 kg (208 lb 15.9 oz) (09/18 2110)   General appearance: alert, cooperative and no distress Throat: lips, mucosa, and tongue normal; teeth and gums normal Resp: clear to auscultation bilaterally Cardio: regular rate and rhythm, S1, S2 normal, no murmur, click, rub or gallop and AVF LUE +B/T Extremities: wound to right plantar aspect with no odor, some pale yellow drainage.  RLE without edema/erythema today. Faint PT pulses  Lab Results  Recent Labs  09/12/16 0400 09/13/16 0744  WBC 5.0 5.5  HGB 11.8* 12.0*  HCT 38.1* 37.3*  NA 137 135  K 4.5 3.9  CL 96* 99*  CO2 30 22  BUN 24* 31*  CREATININE 10.11* 12.69*   Liver Panel  Recent Labs  09/11/16 1449 09/12/16 0400 09/13/16 0744  PROT 8.6*  --   --   ALBUMIN 3.6 2.6* 2.7*  AST  18  --   --   ALT 13*  --   --   ALKPHOS 56  --   --   BILITOT 0.5  --   --    Sedimentation Rate  Recent Labs  09/11/16 2223  ESRSEDRATE 70*   C-Reactive Protein No results for input(s): CRP in the last 72 hours.  Microbiology: Recent Results (from the past 240 hour(s))  Culture, blood (Routine X 2)     Status: None (Preliminary result)   Collection Time: 09/11/16  2:49 PM  Result Value Ref Range Status   Specimen Description BLOOD RIGHT ANTECUBITAL  Final   Special Requests BOTTLES DRAWN AEROBIC AND ANAEROBIC 5CC  Final   Culture   Final    NO GROWTH 2 DAYS Performed at [facility]    Report Status PENDING  Incomplete  Culture, blood (Routine X 2)     Status: None (Preliminary result)   Collection Time: 09/11/16  3:55 PM  Result Value Ref Range Status   Specimen Description BLOOD  RIGHT WRIST  Final   Special Requests BOTTLES DRAWN AEROBIC AND ANAEROBIC 4CC  Final   Culture   Final    NO GROWTH 2 DAYS Performed at [facility]    Report Status PENDING  Incomplete  Urine culture     Status: None   Collection Time: 09/11/16  7:34 PM  Result Value Ref Range Status   Specimen Description URINE, CLEAN CATCH  Final   Special Requests NONE  Final   Culture NO GROWTH Performed at [facility]   Final   Report Status 09/13/2016 FINAL  Final  MRSA PCR Screening     Status: None   Collection Time: 09/12/16  4:21 AM  Result Value Ref Range Status   MRSA by PCR NEGATIVE NEGATIVE Final    Comment:        The GeneXpert MRSA Assay (FDA approved for NASAL specimens only), is one component of a comprehensive MRSA colonization surveillance program. It is not intended to diagnose MRSA infection nor to guide or monitor treatment for MRSA infections.     Studies/Results: Mr Foot Right Wo Contrast  Result Date: 09/13/2016 CLINICAL DATA:  Nonhealing wound plantar soft tissues near the first and second MTP joints for 10 months. Diabetic patient. Subsequent encounter. EXAM: MRI OF THE RIGHT FOREFOOT WITHOUT CONTRAST TECHNIQUE: Multiplanar, multisequence MR imaging was performed. No intravenous contrast was administered. COMPARISON:  Plain films right foot 09/11/2016. MRI right foot 06/14/2016. FINDINGS: A plantar soft tissue wound is identified at the level of the first MTP joint. Intermediate increased T2 signal is seen in proximal and distal phalanges of the great toe, the sesamoid bones of the great toes and in the plantar aspect of the head of the first metatarsal, new since the prior MRI. A new moderate first MTP joint effusion is identified. Fluid is seen in the sheath of the flexor tendon of the great toe to the level of the mid first metatarsal. Bone marrow signal is otherwise unremarkable. Amputation at the level of the distal fifth metatarsal is noted.  IMPRESSION: Findings most consistent with acute osteomyelitis throughout the great toe, sesamoid bones and plantar aspect of the head of the first metatarsal. Fluid in the sheath of the flexor tendon of the great toe could be sympathetic but is worrisome for septic tenosynovitis. First MTP joint effusion worrisome for septic joint. Electronically Signed   By: Inge Rise M.D.   On: 09/13/2016 07:31  Assessment/Plan: Total days of antibiotics: Cefepim 9/16-9/18  Vanco 9/16 w/HD >>  Ceftazidime 9/20 w/HD >>     Impression/Recommendation:  Acute osteomyelitis of right great toe, sesamoid bones, and plantar aspect head of first metatarsal/septic MTP joint DM type 2 ESRD on HD PAD   9/16- BC x 2 ngtd Recommend abx w/HD for six weeks in hopes to avoid surgical intervention/amputation of great toe Continue current vanc w/HD per pharmacy Continue ceftazidime w/HD per pharmacy   CBC today Continue and follow up care with [facility]  Glycemic control per primary team for optimal wound healing Agree with nutritional consult  Would consider vascular and ortho eval however pt would like to go home.       Infectious Diseases (pager) [phone] [email or web address] 09/14/2016, 9:20 AM  LOS: 3 days

## 2016-09-14 NOTE — Progress Notes (Signed)
[name] Sages Sr. to be D/C'd Home per MD order.  Discussed prescriptions and follow up appointments with the patient. Prescriptions given to patient, medication list explained in detail. Pt verbalized understanding.    Medication List    STOP taking these medications   carvedilol 12.5 MG tablet Commonly known as:  COREG     TAKE these medications   acetaminophen 325 MG tablet Commonly known as:  TYLENOL Take 650 mg by mouth every 6 (six) hours as needed (pain).   AURYXIA 1 GM 210 MG(Fe) Tabs Generic drug:  Ferric Citrate Take 2-3 g by mouth See admin instructions. Take 2-3 tablets (2-3 g) by mouth 3 times daily with meals - dosage is based on phosphorus levels provided by dialysis staff.   cefTAZidime 2 g in dextrose 5 % 50 mL Inject 2 g into the vein every Monday, Wednesday, and Friday with hemodialysis. Start taking on:  09/15/2016   cinacalcet 90 MG tablet Commonly known as:  SENSIPAR Take 90 mg by mouth daily with breakfast.   CLEAR EYES OP Place 1 drop into both eyes daily as needed (red eyes).   glucose monitoring kit monitoring kit 1 each by Does not apply route 4 (four) times daily - after meals and at bedtime. 1 month Diabetic Testing Supplies for QAC-QHS accuchecks.Any brand OK   HYDROcodone-acetaminophen 5-325 MG tablet Commonly known as:  NORCO/VICODIN Take 1-2 tablets by mouth every 6 (six) hours as needed for moderate pain.   insulin aspart 100 UNIT/ML injection Commonly known as:  novoLOG Before each meal 3 times a day, 140-199 - 2 units, 200-250 - 4 units, 251-299 - 6 units,  300-349 - 8 units,  350 or above 10 units. Insulin PEN if approved, provide syringes and needles if needed. What changed:  how much to take  how to take this  when to take this  reasons to take this  additional instructions   Insulin Syringe-Needle U-100 25G X 1" 1 ML Misc Any brand, for 4 times a day insulin SQ, 1 month supply.   multivitamin Tabs tablet Take 1 tablet by  mouth at bedtime.   mupirocin ointment 2 % Commonly known as:  BACTROBAN Apply 1 application topically 2 (two) times daily as needed (rash on stomach).   vancomycin 1-5 GM/200ML-% Soln Commonly known as:  VANCOCIN Inject 200 mLs (1,000 mg total) into the vein every Monday, Wednesday, and Friday with hemodialysis. Start taking on:  09/15/2016   VITAMIN C PO Take 1 tablet by mouth daily.       Vitals:   09/14/16 0446 09/14/16 0930  BP: (!) 130/53 (!) 117/99  Pulse: 69 80  Resp: 18 17  Temp: 99.7 F (37.6 C) 99.3 F (37.4 C)    Skin clean, dry and intact without evidence of skin break down, no evidence of skin tears noted. IV catheter discontinued intact. Site without signs and symptoms of complications. Dressing and pressure applied. Pt denies pain at this time. No complaints noted.  An After Visit Summary was printed and given to the patient. Patient escorted via [place], and D/C home via private auto.  Retta Mac BSN, RN

## 2016-09-14 NOTE — Care Management Important Message (Signed)
Important Message  Patient Details  Name: [name] BAIL Sr. MRN: [id number] Date of Birth: [birth year]   Medicare Important Message Given:  Yes    Leeanne Butters 09/14/2016, 10:58 AM

## 2016-09-14 NOTE — Discharge Summary (Signed)
Physician Discharge Summary  [name] Sages Sr. NLZ:[id number] DOB: [birth year] DOA: 09/11/2016  PCP: [name], MD  Admit date: 09/11/2016 Discharge date: 09/14/2016  Time spent: > 35 minutes  Recommendations for Outpatient Follow-up:  1. Monitor WBC levels. 2. 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 ([place])   ESRD on dialysis [facility])   Fever of unclear source   Discharge Condition: stable  Diet recommendation: renal/carb modified  Filed Weights   09/13/16 0731 09/13/16 1203 09/13/16 2110  Weight: 96.3 kg (212 lb 4.9 oz) 94.5 kg (208 lb 5.4 oz) 94.8 kg (208 lb 15.9 oz)    History of present illness:  65 y.o.malewith medical history significant of diabetes mellitus with peripheral neuropathy with a chronic diabetic foot ulcer ahemoglobin A1c of 5.7 back in 2015, with 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   Consulted orthopedic surgeon who is recommending prolonged IV antibiotic therapy prior to considering amputation  Hospital Course:  Osteomyelitis of right foot - Findings most consistent with acute osteomyelitis throughout the great toe, sesamoid bones and plantar aspect of the head of the first metatarsal. - Consulted orthopedic surgeon who recommended prolonged IV antibiotics - Consulted ID who recommending vancomycin and Ceftazidime for a period of 6 weeks - Patient is also to go to wound care center for continued evaluation - Recommended patient follow-up with ID after hospital discharge   Anemia from ESRD: Stable.   ESRD ON HD MWF. Further recommendations as per renal. With  administration of IV antibiotics as listed above   Procedures:  MR right foot  Consultations:  Discussed with orthopedic surgeon  Consulted infectious disease specialist  Discharge Exam: Vitals:   09/14/16 0446 09/14/16 0930  BP: (!) 130/53 (!) 117/99  Pulse: 69 80  Resp: 18 17  Temp: 99.7 F (37.6 C) 99.3 F (37.4 C)    General: Patient in no acute distress, alert and awake Cardiovascular: Regular rate and rhythm, no murmurs rubs Respiratory: No increased work of breathing, no wheezes  Discharge Instructions   Discharge Instructions    Call MD for:  difficulty breathing, headache or visual disturbances    Complete by:  As directed    Call MD for:  temperature >100.4    Complete by:  As directed    Diet - low sodium heart healthy    Complete by:  As directed    Discharge instructions    Complete by:  As directed    Please be sure to follow up with infectious disease specialist. Also continue routine dialysis and ensure you are getting ceftazidime and vancomycin during dialysis sessions   Increase activity slowly    Complete by:  As directed      Current Discharge Medication List    START taking these medications   Details  cefTAZidime 2 g in dextrose 5 % 50 mL Inject 2 g into the vein every Monday, Wednesday, and Friday with hemodialysis. Qty: 2 g, Refills: 0    vancomycin (VANCOCIN) 1-5 GM/200ML-% SOLN Inject 200 mLs (1,000 mg total) into the vein every Monday, Wednesday, and Friday with hemodialysis. Qty: 4000 mL, Refills: 0  CONTINUE these medications which have NOT CHANGED   Details  acetaminophen (TYLENOL) 325 MG tablet Take 650 mg by mouth every 6 (six) hours as needed (pain).     Ascorbic Acid (VITAMIN C PO) Take 1 tablet by mouth daily.    cinacalcet (SENSIPAR) 90 MG tablet Take 90 mg by mouth daily with breakfast.    Ferric Citrate (AURYXIA) 1 GM 210 MG(Fe) TABS Take 2-3 g by mouth See admin instructions. Take 2-3 tablets (2-3 g) by mouth  3 times daily with meals - dosage is based on phosphorus levels provided by dialysis staff.    insulin aspart (NOVOLOG) 100 UNIT/ML injection Before each meal 3 times a day, 140-199 - 2 units, 200-250 - 4 units, 251-299 - 6 units,  300-349 - 8 units,  350 or above 10 units. Insulin PEN if approved, provide syringes and needles if needed. Qty: 10 mL, Refills: 11    mupirocin ointment (BACTROBAN) 2 % Apply 1 application topically 2 (two) times daily as needed (rash on stomach).  Refills: 0    Naphazoline HCl (CLEAR EYES OP) Place 1 drop into both eyes daily as needed (red eyes).    glucose monitoring kit (FREESTYLE) monitoring kit 1 each by Does not apply route 4 (four) times daily - after meals and at bedtime. 1 month Diabetic Testing Supplies for QAC-QHS accuchecks.Any brand OK Qty: 1 each, Refills: 1    HYDROcodone-acetaminophen (NORCO/VICODIN) 5-325 MG per tablet Take 1-2 tablets by mouth every 6 (six) hours as needed for moderate pain. Qty: 15 tablet, Refills: 0    Insulin Syringe-Needle U-100 25G X 1" 1 ML MISC Any brand, for 4 times a day insulin SQ, 1 month supply. Qty: 30 each, Refills: 0    multivitamin (RENA-VIT) TABS tablet Take 1 tablet by mouth at bedtime. Qty: 30 each, Refills: 0      STOP taking these medications     carvedilol (COREG) 12.5 MG tablet      Vitamin D, Ergocalciferol, (DRISDOL) 50000 UNITS CAPS capsule        Allergies  Allergen Reactions  . Penicillins Hives and Swelling    Ankle swelling Has patient had a PCN reaction causing immediate rash, facial/tongue/throat swelling, SOB or lightheadedness with hypotension: yes Has patient had a PCN reaction causing severe rash involving mucus membranes or skin necrosis: no Has patient had a PCN reaction that required hospitalization: no Has patient had a PCN reaction occurring within the last 10 years: no If all of the above answers are "NO", then may proceed with Cephalosporin use.      The results of  significant diagnostics from this hospitalization (including imaging, microbiology, ancillary and laboratory) are listed below for reference.    Significant Diagnostic Studies: Dg Chest 2 View  Result Date: 09/11/2016 CLINICAL DATA:  Left lower anterior chest pain. EXAM: CHEST  2 VIEW COMPARISON:  11/22/2014 FINDINGS: The heart size and mediastinal contours are within normal limits. There is no evidence of pulmonary edema, consolidation, pneumothorax, nodule or pleural fluid. The visualized skeletal structures are unremarkable. IMPRESSION: No active cardiopulmonary disease. Electronically Signed   By: Aletta Edouard M.D.   On: 09/11/2016 14:28   Mr Foot Right Wo Contrast  Result Date: 09/13/2016 CLINICAL DATA:  Nonhealing wound plantar soft tissues near the first and second MTP joints for 10 months. Diabetic patient. Subsequent encounter. EXAM: MRI OF THE RIGHT FOREFOOT WITHOUT CONTRAST TECHNIQUE: Multiplanar, multisequence MR imaging was performed. No intravenous contrast was administered. COMPARISON:  Plain films  right foot 09/11/2016. MRI right foot 06/14/2016. FINDINGS: A plantar soft tissue wound is identified at the level of the first MTP joint. Intermediate increased T2 signal is seen in proximal and distal phalanges of the great toe, the sesamoid bones of the great toes and in the plantar aspect of the head of the first metatarsal, new since the prior MRI. A new moderate first MTP joint effusion is identified. Fluid is seen in the sheath of the flexor tendon of the great toe to the level of the mid first metatarsal. Bone marrow signal is otherwise unremarkable. Amputation at the level of the distal fifth metatarsal is noted. IMPRESSION: Findings most consistent with acute osteomyelitis throughout the great toe, sesamoid bones and plantar aspect of the head of the first metatarsal. Fluid in the sheath of the flexor tendon of the great toe could be sympathetic but is worrisome for septic  tenosynovitis. First MTP joint effusion worrisome for septic joint. Electronically Signed   By: Inge Rise M.D.   On: 09/13/2016 07:31   Dg Foot Complete Right  Result Date: 09/11/2016 CLINICAL DATA:  Nonhealing wound at plantar aspect of RIGHT foot near the distal first and second metatarsals, wound present for 10 months weeping clear fluid, history diabetes mellitus, amputation of little total 1 year ago, hypertension, end-stage renal disease, cardiomyopathy EXAM: RIGHT FOOT COMPLETE - 3+ VIEW COMPARISON:  05/18/2016 FINDINGS: Osseous demineralization. Prior amputation of the fifth toe through the level of the distal fifth metatarsal. Joint spaces preserved. Toes flexed limiting assessment. No definite fracture, dislocation, or bone destruction. Extensive small vessel vascular calcification. No definite soft tissue gas identified. Mild soft tissue swelling of forefoot. IMPRESSION: Osseous demineralization with evidence of prior fifth toe amputation. No definite acute bony abnormalities. If there is persistent clinical concern for osteomyelitis, consider MR. Electronically Signed   By: Lavonia Dana M.D.   On: 09/11/2016 16:36    Microbiology: Recent Results (from the past 240 hour(s))  Culture, blood (Routine X 2)     Status: None (Preliminary result)   Collection Time: 09/11/16  2:49 PM  Result Value Ref Range Status   Specimen Description BLOOD RIGHT ANTECUBITAL  Final   Special Requests BOTTLES DRAWN AEROBIC AND ANAEROBIC 5CC  Final   Culture   Final    NO GROWTH 3 DAYS Performed at [facility]    Report Status PENDING  Incomplete  Culture, blood (Routine X 2)     Status: None (Preliminary result)   Collection Time: 09/11/16  3:55 PM  Result Value Ref Range Status   Specimen Description BLOOD RIGHT WRIST  Final   Special Requests BOTTLES DRAWN AEROBIC AND ANAEROBIC 4CC  Final   Culture   Final    NO GROWTH 3 DAYS Performed at [facility]    Report Status PENDING   Incomplete  Urine culture     Status: None   Collection Time: 09/11/16  7:34 PM  Result Value Ref Range Status   Specimen Description URINE, CLEAN CATCH  Final   Special Requests NONE  Final   Culture NO GROWTH Performed at [facility]   Final   Report Status 09/13/2016 FINAL  Final  MRSA PCR Screening     Status: None   Collection Time: 09/12/16  4:21 AM  Result Value Ref Range Status   MRSA by PCR NEGATIVE NEGATIVE Final    Comment:        The GeneXpert MRSA Assay (FDA approved for NASAL specimens only),  is one component of a comprehensive MRSA colonization surveillance program. It is not intended to diagnose MRSA infection nor to guide or monitor treatment for MRSA infections.      Labs: Basic Metabolic Panel:  Recent Labs Lab 09/11/16 1449 09/11/16 1812 09/11/16 2223 09/12/16 0400 09/13/16 0744  NA 135 135  --  137 135  K 4.2 6.4*  --  4.5 3.9  CL 93* 95*  --  96* 99*  CO2 29  --   --  30 22  GLUCOSE 208* 162*  --  148* 124*  BUN 21* 32*  --  24* 31*  CREATININE 8.24* 8.50* 9.49* 10.11* 12.69*  CALCIUM 8.7*  --   --  8.0* 7.9*  PHOS  --   --   --  5.0* 4.9*   Liver Function Tests:  Recent Labs Lab 09/11/16 1449 09/12/16 0400 09/13/16 0744  AST 18  --   --   ALT 13*  --   --   ALKPHOS 56  --   --   BILITOT 0.5  --   --   PROT 8.6*  --   --   ALBUMIN 3.6 2.6* 2.7*   No results for input(s): LIPASE, AMYLASE in the last 168 hours. No results for input(s): AMMONIA in the last 168 hours. CBC:  Recent Labs Lab 09/11/16 1610 09/11/16 1812 09/11/16 2223 09/12/16 0400 09/13/16 0744  WBC 3.6*  --  5.6 5.0 5.5  NEUTROABS 2.3  --   --   --   --   HGB 13.3 14.3 12.1* 11.8* 12.0*  HCT 40.8 42.0 38.5* 38.1* 37.3*  MCV 96.9  --  97.7 99.5 97.9  PLT 289  --  261 287 284   Cardiac Enzymes: No results for input(s): CKTOTAL, CKMB, CKMBINDEX, TROPONINI in the last 168 hours. BNP: BNP (last 3 results) No results for input(s): BNP in the last  8760 hours.  ProBNP (last 3 results) No results for input(s): PROBNP in the last 8760 hours.  CBG:  Recent Labs Lab 09/12/16 2223 09/13/16 1230 09/13/16 1718 09/13/16 2129 09/14/16 0757  GLUCAP 154* 154* 154* 151* 144*    Signed:  Velvet Bathe MD.  [facility] 09/14/2016, 2:16 PM

## 2016-09-14 NOTE — Progress Notes (Signed)
[facility] Progress Note   Subjective: "I'm feeling a little better". Talking with ID student NP at present. Drsg open R foot. No odor, pale greyish yellow drainage.  No C/Os.   Objective Vitals:   09/13/16 1233 09/13/16 2110 09/14/16 0446 09/14/16 0930  BP: 127/64 138/62 (!) 130/53 (!) 117/99  Pulse: 73 76 69 80  Resp: 16 17 18 17   Temp: 98.8 F (37.1 C) 99.2 F (37.3 C) 99.7 F (37.6 C) 99.3 F (37.4 C)  TempSrc: Oral Oral Oral Oral  SpO2: 94% 97% 96% 100%  Weight:  94.8 kg (208 lb 15.9 oz)    Height:       Physical Exam General: Pleasant, NAD Heart: S1 S2. 2/6 systolic M.  Lungs: BBS CTA Abdomen: Soft, active BS Extremities: No LE edema. 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 Wound R foot as noted above-redrsd.  Dialysis Access: LUA AVF + bruit  Dialysis Orders: [facility] MWF 4 hours 15 minutes 425/800 94 kg 2.0K/2.25Ca  Heparin 6000 units IV q treatment No ESA/No VDRA Labs: HGB 14.1 Ca 9.7 Phos 5.6 (09/08/16) PTH 68 (08/18/16)  Assessment/Plan: 1. Extensive right great toe acute osteo per MRI/septic joint - BC NG X 3 days - empiric abtx- fevers down- Vanc and Maxipime with HD -ID consulted. Tmax 99.3 WBC 5.5. Will have IV Vanc/Fortaz with HD on DC.  2. ESRD  MWF [place]. Next HD tomorrow. K+ 3.9 3. Anemia - hgb 12 - no ESA. Follow HGB  4. Secondary hyperparathyroidism -Ca 7.9 C Ca 8.94 Phos 4.9. Cont sensipar/no VDRA. 5. HTN/volume - HD 09/18 Pre wt 96.3 kg Net UF 1424 Post wt 94.5. No hypotension. Think this wt is more appropriate for pt. ^ EDW 94.5 kg on DC.  6. Nutrition - renal diet and start vits/suppl 7. DM - per primary  Rita H. Brown NP-C 09/14/2016, 10:35 AM  [facility] [phone]  Pt seen, examined and agree w A/P as above.  Kelly Splinter MD [facility] pager [phone]    cell [phone] 09/14/2016, 12:16 PM  Additional Objective Labs: Basic Metabolic Panel:  Recent  Labs Lab 09/11/16 1449 09/11/16 1812 09/11/16 2223 09/12/16 0400 09/13/16 0744  NA 135 135  --  137 135  K 4.2 6.4*  --  4.5 3.9  CL 93* 95*  --  96* 99*  CO2 29  --   --  30 22  GLUCOSE 208* 162*  --  148* 124*  BUN 21* 32*  --  24* 31*  CREATININE 8.24* 8.50* 9.49* 10.11* 12.69*  CALCIUM 8.7*  --   --  8.0* 7.9*  PHOS  --   --   --  5.0* 4.9*   Liver Function Tests:  Recent Labs Lab 09/11/16 1449 09/12/16 0400 09/13/16 0744  AST 18  --   --   ALT 13*  --   --   ALKPHOS 56  --   --   BILITOT 0.5  --   --   PROT 8.6*  --   --   ALBUMIN 3.6 2.6* 2.7*   No results for input(s): LIPASE, AMYLASE in the last 168 hours. CBC:  Recent Labs Lab 09/11/16 1610  09/11/16 2223 09/12/16 0400 09/13/16 0744  WBC 3.6*  --  5.6 5.0 5.5  NEUTROABS 2.3  --   --   --   --   HGB 13.3  < > 12.1* 11.8* 12.0*  HCT 40.8  < > 38.5* 38.1*  37.3*  MCV 96.9  --  97.7 99.5 97.9  PLT 289  --  261 287 284  < > = values in this interval not displayed. Blood Culture    Component Value Date/Time   SDES URINE, CLEAN CATCH 09/11/2016 1934   SPECREQUEST NONE 09/11/2016 1934   CULT NO GROWTH Performed at [facility]  09/11/2016 1934   REPTSTATUS 09/13/2016 FINAL 09/11/2016 1934    Cardiac Enzymes: No results for input(s): CKTOTAL, CKMB, CKMBINDEX, TROPONINI in the last 168 hours. CBG:  Recent Labs Lab 09/12/16 2223 09/13/16 1230 09/13/16 1718 09/13/16 2129 09/14/16 0757  GLUCAP 154* 154* 154* 151* 144*   Iron Studies: No results for input(s): IRON, TIBC, TRANSFERRIN, FERRITIN in the last 72 hours. @lablastinr3 @ Studies/Results: Mr Foot Right Wo Contrast  Result Date: 09/13/2016 CLINICAL DATA:  Nonhealing wound plantar soft tissues near the first and second MTP joints for 10 months. Diabetic patient. Subsequent encounter. EXAM: MRI OF THE RIGHT FOREFOOT WITHOUT CONTRAST TECHNIQUE: Multiplanar, multisequence MR imaging was performed. No intravenous contrast was administered.  COMPARISON:  Plain films right foot 09/11/2016. MRI right foot 06/14/2016. FINDINGS: A plantar soft tissue wound is identified at the level of the first MTP joint. Intermediate increased T2 signal is seen in proximal and distal phalanges of the great toe, the sesamoid bones of the great toes and in the plantar aspect of the head of the first metatarsal, new since the prior MRI. A new moderate first MTP joint effusion is identified. Fluid is seen in the sheath of the flexor tendon of the great toe to the level of the mid first metatarsal. Bone marrow signal is otherwise unremarkable. Amputation at the level of the distal fifth metatarsal is noted. IMPRESSION: Findings most consistent with acute osteomyelitis throughout the great toe, sesamoid bones and plantar aspect of the head of the first metatarsal. Fluid in the sheath of the flexor tendon of the great toe could be sympathetic but is worrisome for septic tenosynovitis. First MTP joint effusion worrisome for septic joint. Electronically Signed   By: Inge Rise M.D.   On: 09/13/2016 07:31   Medications:   . [START ON 09/15/2016] cefTAZidime (FORTAZ)  IV  2 g Intravenous Q M,W,F-2000  . cinacalcet  30 mg Oral Q supper  . feeding supplement (NEPRO CARB STEADY)  237 mL Oral BID BM  . heparin  5,000 Units Subcutaneous Q8H  . insulin aspart  0-5 Units Subcutaneous QHS  . insulin aspart  0-9 Units Subcutaneous TID WC  . insulin aspart  3 Units Subcutaneous TID WC  . multivitamin  1 tablet Oral QHS  . sodium chloride flush  3 mL Intravenous Q12H  . sucroferric oxyhydroxide  500 mg Oral TID WC  . vancomycin  1,000 mg Intravenous Q M,W,F-HD  . [START ON 09/15/2016] Vitamin D (Ergocalciferol)  50,000 Units Oral Q Wed

## 2016-09-15 DIAGNOSIS — E1121 Type 2 diabetes mellitus with diabetic nephropathy: Secondary | ICD-10-CM | POA: Diagnosis not present

## 2016-09-15 DIAGNOSIS — N2581 Secondary hyperparathyroidism of renal origin: Secondary | ICD-10-CM | POA: Diagnosis not present

## 2016-09-15 DIAGNOSIS — Z23 Encounter for immunization: Secondary | ICD-10-CM | POA: Diagnosis not present

## 2016-09-15 DIAGNOSIS — N186 End stage renal disease: Secondary | ICD-10-CM | POA: Diagnosis not present

## 2016-09-15 DIAGNOSIS — S91309A Unspecified open wound, unspecified foot, initial encounter: Secondary | ICD-10-CM | POA: Diagnosis not present

## 2016-09-16 LAB — CULTURE, BLOOD (ROUTINE X 2)
CULTURE: NO GROWTH
Culture: NO GROWTH

## 2016-09-17 DIAGNOSIS — E1121 Type 2 diabetes mellitus with diabetic nephropathy: Secondary | ICD-10-CM | POA: Diagnosis not present

## 2016-09-17 DIAGNOSIS — S91309A Unspecified open wound, unspecified foot, initial encounter: Secondary | ICD-10-CM | POA: Diagnosis not present

## 2016-09-17 DIAGNOSIS — N186 End stage renal disease: Secondary | ICD-10-CM | POA: Diagnosis not present

## 2016-09-17 DIAGNOSIS — Z23 Encounter for immunization: Secondary | ICD-10-CM | POA: Diagnosis not present

## 2016-09-17 DIAGNOSIS — N2581 Secondary hyperparathyroidism of renal origin: Secondary | ICD-10-CM | POA: Diagnosis not present

## 2016-09-20 DIAGNOSIS — N186 End stage renal disease: Secondary | ICD-10-CM | POA: Diagnosis not present

## 2016-09-20 DIAGNOSIS — Z23 Encounter for immunization: Secondary | ICD-10-CM | POA: Diagnosis not present

## 2016-09-20 DIAGNOSIS — S91309A Unspecified open wound, unspecified foot, initial encounter: Secondary | ICD-10-CM | POA: Diagnosis not present

## 2016-09-20 DIAGNOSIS — E1121 Type 2 diabetes mellitus with diabetic nephropathy: Secondary | ICD-10-CM | POA: Diagnosis not present

## 2016-09-20 DIAGNOSIS — N2581 Secondary hyperparathyroidism of renal origin: Secondary | ICD-10-CM | POA: Diagnosis not present

## 2016-09-21 DIAGNOSIS — L97512 Non-pressure chronic ulcer of other part of right foot with fat layer exposed: Secondary | ICD-10-CM | POA: Diagnosis not present

## 2016-09-21 DIAGNOSIS — I132 Hypertensive heart and chronic kidney disease with heart failure and with stage 5 chronic kidney disease, or end stage renal disease: Secondary | ICD-10-CM | POA: Diagnosis not present

## 2016-09-21 DIAGNOSIS — N185 Chronic kidney disease, stage 5: Secondary | ICD-10-CM | POA: Diagnosis not present

## 2016-09-21 DIAGNOSIS — M86371 Chronic multifocal osteomyelitis, right ankle and foot: Secondary | ICD-10-CM | POA: Diagnosis not present

## 2016-09-21 DIAGNOSIS — M86671 Other chronic osteomyelitis, right ankle and foot: Secondary | ICD-10-CM | POA: Diagnosis not present

## 2016-09-21 DIAGNOSIS — E1122 Type 2 diabetes mellitus with diabetic chronic kidney disease: Secondary | ICD-10-CM | POA: Diagnosis not present

## 2016-09-21 DIAGNOSIS — E1151 Type 2 diabetes mellitus with diabetic peripheral angiopathy without gangrene: Secondary | ICD-10-CM | POA: Diagnosis not present

## 2016-09-21 DIAGNOSIS — E11621 Type 2 diabetes mellitus with foot ulcer: Secondary | ICD-10-CM | POA: Diagnosis not present

## 2016-09-21 DIAGNOSIS — E1140 Type 2 diabetes mellitus with diabetic neuropathy, unspecified: Secondary | ICD-10-CM | POA: Diagnosis not present

## 2016-09-21 DIAGNOSIS — I509 Heart failure, unspecified: Secondary | ICD-10-CM | POA: Diagnosis not present

## 2016-09-21 DIAGNOSIS — L84 Corns and callosities: Secondary | ICD-10-CM | POA: Diagnosis not present

## 2016-09-21 DIAGNOSIS — Z992 Dependence on renal dialysis: Secondary | ICD-10-CM | POA: Diagnosis not present

## 2016-09-22 DIAGNOSIS — N2581 Secondary hyperparathyroidism of renal origin: Secondary | ICD-10-CM | POA: Diagnosis not present

## 2016-09-22 DIAGNOSIS — Z23 Encounter for immunization: Secondary | ICD-10-CM | POA: Diagnosis not present

## 2016-09-22 DIAGNOSIS — S91309A Unspecified open wound, unspecified foot, initial encounter: Secondary | ICD-10-CM | POA: Diagnosis not present

## 2016-09-22 DIAGNOSIS — N186 End stage renal disease: Secondary | ICD-10-CM | POA: Diagnosis not present

## 2016-09-22 DIAGNOSIS — E1121 Type 2 diabetes mellitus with diabetic nephropathy: Secondary | ICD-10-CM | POA: Diagnosis not present

## 2016-09-24 DIAGNOSIS — N2581 Secondary hyperparathyroidism of renal origin: Secondary | ICD-10-CM | POA: Diagnosis not present

## 2016-09-24 DIAGNOSIS — Z23 Encounter for immunization: Secondary | ICD-10-CM | POA: Diagnosis not present

## 2016-09-24 DIAGNOSIS — N186 End stage renal disease: Secondary | ICD-10-CM | POA: Diagnosis not present

## 2016-09-24 DIAGNOSIS — S91309A Unspecified open wound, unspecified foot, initial encounter: Secondary | ICD-10-CM | POA: Diagnosis not present

## 2016-09-24 DIAGNOSIS — E1121 Type 2 diabetes mellitus with diabetic nephropathy: Secondary | ICD-10-CM | POA: Diagnosis not present

## 2016-09-25 DIAGNOSIS — Z992 Dependence on renal dialysis: Secondary | ICD-10-CM | POA: Diagnosis not present

## 2016-09-25 DIAGNOSIS — E1122 Type 2 diabetes mellitus with diabetic chronic kidney disease: Secondary | ICD-10-CM | POA: Diagnosis not present

## 2016-09-25 DIAGNOSIS — N186 End stage renal disease: Secondary | ICD-10-CM | POA: Diagnosis not present

## 2016-09-27 DIAGNOSIS — N186 End stage renal disease: Secondary | ICD-10-CM | POA: Diagnosis not present

## 2016-09-27 DIAGNOSIS — S91309A Unspecified open wound, unspecified foot, initial encounter: Secondary | ICD-10-CM | POA: Diagnosis not present

## 2016-09-27 DIAGNOSIS — N2581 Secondary hyperparathyroidism of renal origin: Secondary | ICD-10-CM | POA: Diagnosis not present

## 2016-09-28 ENCOUNTER — Encounter (HOSPITAL_BASED_OUTPATIENT_CLINIC_OR_DEPARTMENT_OTHER): Payer: Medicare Other | Attending: Surgery

## 2016-09-28 DIAGNOSIS — L97512 Non-pressure chronic ulcer of other part of right foot with fat layer exposed: Secondary | ICD-10-CM | POA: Diagnosis not present

## 2016-09-28 DIAGNOSIS — E11621 Type 2 diabetes mellitus with foot ulcer: Secondary | ICD-10-CM | POA: Insufficient documentation

## 2016-09-28 DIAGNOSIS — M86371 Chronic multifocal osteomyelitis, right ankle and foot: Secondary | ICD-10-CM | POA: Insufficient documentation

## 2016-09-28 DIAGNOSIS — I132 Hypertensive heart and chronic kidney disease with heart failure and with stage 5 chronic kidney disease, or end stage renal disease: Secondary | ICD-10-CM | POA: Insufficient documentation

## 2016-09-28 DIAGNOSIS — L97521 Non-pressure chronic ulcer of other part of left foot limited to breakdown of skin: Secondary | ICD-10-CM | POA: Diagnosis not present

## 2016-09-28 DIAGNOSIS — E1169 Type 2 diabetes mellitus with other specified complication: Secondary | ICD-10-CM | POA: Insufficient documentation

## 2016-09-28 DIAGNOSIS — Z992 Dependence on renal dialysis: Secondary | ICD-10-CM | POA: Insufficient documentation

## 2016-09-28 DIAGNOSIS — L97822 Non-pressure chronic ulcer of other part of left lower leg with fat layer exposed: Secondary | ICD-10-CM | POA: Insufficient documentation

## 2016-09-28 DIAGNOSIS — N186 End stage renal disease: Secondary | ICD-10-CM | POA: Insufficient documentation

## 2016-09-28 DIAGNOSIS — I509 Heart failure, unspecified: Secondary | ICD-10-CM | POA: Diagnosis not present

## 2016-09-28 DIAGNOSIS — E1140 Type 2 diabetes mellitus with diabetic neuropathy, unspecified: Secondary | ICD-10-CM | POA: Insufficient documentation

## 2016-09-29 DIAGNOSIS — S91309A Unspecified open wound, unspecified foot, initial encounter: Secondary | ICD-10-CM | POA: Diagnosis not present

## 2016-09-29 DIAGNOSIS — N2581 Secondary hyperparathyroidism of renal origin: Secondary | ICD-10-CM | POA: Diagnosis not present

## 2016-09-29 DIAGNOSIS — N186 End stage renal disease: Secondary | ICD-10-CM | POA: Diagnosis not present

## 2016-10-01 DIAGNOSIS — N186 End stage renal disease: Secondary | ICD-10-CM | POA: Diagnosis not present

## 2016-10-01 DIAGNOSIS — S91309A Unspecified open wound, unspecified foot, initial encounter: Secondary | ICD-10-CM | POA: Diagnosis not present

## 2016-10-01 DIAGNOSIS — N2581 Secondary hyperparathyroidism of renal origin: Secondary | ICD-10-CM | POA: Diagnosis not present

## 2016-10-04 DIAGNOSIS — N186 End stage renal disease: Secondary | ICD-10-CM | POA: Diagnosis not present

## 2016-10-04 DIAGNOSIS — S91309A Unspecified open wound, unspecified foot, initial encounter: Secondary | ICD-10-CM | POA: Diagnosis not present

## 2016-10-04 DIAGNOSIS — N2581 Secondary hyperparathyroidism of renal origin: Secondary | ICD-10-CM | POA: Diagnosis not present

## 2016-10-05 DIAGNOSIS — E11621 Type 2 diabetes mellitus with foot ulcer: Secondary | ICD-10-CM | POA: Diagnosis not present

## 2016-10-05 DIAGNOSIS — E1169 Type 2 diabetes mellitus with other specified complication: Secondary | ICD-10-CM | POA: Diagnosis not present

## 2016-10-05 DIAGNOSIS — L97822 Non-pressure chronic ulcer of other part of left lower leg with fat layer exposed: Secondary | ICD-10-CM | POA: Diagnosis not present

## 2016-10-05 DIAGNOSIS — L97411 Non-pressure chronic ulcer of right heel and midfoot limited to breakdown of skin: Secondary | ICD-10-CM | POA: Diagnosis not present

## 2016-10-05 DIAGNOSIS — L97512 Non-pressure chronic ulcer of other part of right foot with fat layer exposed: Secondary | ICD-10-CM | POA: Diagnosis not present

## 2016-10-05 DIAGNOSIS — M86371 Chronic multifocal osteomyelitis, right ankle and foot: Secondary | ICD-10-CM | POA: Diagnosis not present

## 2016-10-05 DIAGNOSIS — L97521 Non-pressure chronic ulcer of other part of left foot limited to breakdown of skin: Secondary | ICD-10-CM | POA: Diagnosis not present

## 2016-10-06 DIAGNOSIS — S91309A Unspecified open wound, unspecified foot, initial encounter: Secondary | ICD-10-CM | POA: Diagnosis not present

## 2016-10-06 DIAGNOSIS — N2581 Secondary hyperparathyroidism of renal origin: Secondary | ICD-10-CM | POA: Diagnosis not present

## 2016-10-06 DIAGNOSIS — N186 End stage renal disease: Secondary | ICD-10-CM | POA: Diagnosis not present

## 2016-10-08 DIAGNOSIS — S91309A Unspecified open wound, unspecified foot, initial encounter: Secondary | ICD-10-CM | POA: Diagnosis not present

## 2016-10-08 DIAGNOSIS — N186 End stage renal disease: Secondary | ICD-10-CM | POA: Diagnosis not present

## 2016-10-08 DIAGNOSIS — N2581 Secondary hyperparathyroidism of renal origin: Secondary | ICD-10-CM | POA: Diagnosis not present

## 2016-10-11 DIAGNOSIS — S91309A Unspecified open wound, unspecified foot, initial encounter: Secondary | ICD-10-CM | POA: Diagnosis not present

## 2016-10-11 DIAGNOSIS — N2581 Secondary hyperparathyroidism of renal origin: Secondary | ICD-10-CM | POA: Diagnosis not present

## 2016-10-11 DIAGNOSIS — N186 End stage renal disease: Secondary | ICD-10-CM | POA: Diagnosis not present

## 2016-10-12 DIAGNOSIS — L97521 Non-pressure chronic ulcer of other part of left foot limited to breakdown of skin: Secondary | ICD-10-CM | POA: Diagnosis not present

## 2016-10-12 DIAGNOSIS — E1169 Type 2 diabetes mellitus with other specified complication: Secondary | ICD-10-CM | POA: Diagnosis not present

## 2016-10-12 DIAGNOSIS — N185 Chronic kidney disease, stage 5: Secondary | ICD-10-CM | POA: Diagnosis not present

## 2016-10-12 DIAGNOSIS — L97412 Non-pressure chronic ulcer of right heel and midfoot with fat layer exposed: Secondary | ICD-10-CM | POA: Diagnosis not present

## 2016-10-12 DIAGNOSIS — L97822 Non-pressure chronic ulcer of other part of left lower leg with fat layer exposed: Secondary | ICD-10-CM | POA: Diagnosis not present

## 2016-10-12 DIAGNOSIS — E11621 Type 2 diabetes mellitus with foot ulcer: Secondary | ICD-10-CM | POA: Diagnosis not present

## 2016-10-12 DIAGNOSIS — M86371 Chronic multifocal osteomyelitis, right ankle and foot: Secondary | ICD-10-CM | POA: Diagnosis not present

## 2016-10-12 DIAGNOSIS — L97512 Non-pressure chronic ulcer of other part of right foot with fat layer exposed: Secondary | ICD-10-CM | POA: Diagnosis not present

## 2016-10-13 DIAGNOSIS — N2581 Secondary hyperparathyroidism of renal origin: Secondary | ICD-10-CM | POA: Diagnosis not present

## 2016-10-13 DIAGNOSIS — S91309A Unspecified open wound, unspecified foot, initial encounter: Secondary | ICD-10-CM | POA: Diagnosis not present

## 2016-10-13 DIAGNOSIS — N186 End stage renal disease: Secondary | ICD-10-CM | POA: Diagnosis not present

## 2016-10-15 DIAGNOSIS — N2581 Secondary hyperparathyroidism of renal origin: Secondary | ICD-10-CM | POA: Diagnosis not present

## 2016-10-15 DIAGNOSIS — N186 End stage renal disease: Secondary | ICD-10-CM | POA: Diagnosis not present

## 2016-10-15 DIAGNOSIS — S91309A Unspecified open wound, unspecified foot, initial encounter: Secondary | ICD-10-CM | POA: Diagnosis not present

## 2016-10-18 DIAGNOSIS — S91309A Unspecified open wound, unspecified foot, initial encounter: Secondary | ICD-10-CM | POA: Diagnosis not present

## 2016-10-18 DIAGNOSIS — N2581 Secondary hyperparathyroidism of renal origin: Secondary | ICD-10-CM | POA: Diagnosis not present

## 2016-10-18 DIAGNOSIS — N186 End stage renal disease: Secondary | ICD-10-CM | POA: Diagnosis not present

## 2016-10-19 DIAGNOSIS — L97521 Non-pressure chronic ulcer of other part of left foot limited to breakdown of skin: Secondary | ICD-10-CM | POA: Diagnosis not present

## 2016-10-19 DIAGNOSIS — L97522 Non-pressure chronic ulcer of other part of left foot with fat layer exposed: Secondary | ICD-10-CM | POA: Diagnosis not present

## 2016-10-19 DIAGNOSIS — E1169 Type 2 diabetes mellitus with other specified complication: Secondary | ICD-10-CM | POA: Diagnosis not present

## 2016-10-19 DIAGNOSIS — E11621 Type 2 diabetes mellitus with foot ulcer: Secondary | ICD-10-CM | POA: Diagnosis not present

## 2016-10-19 DIAGNOSIS — M86371 Chronic multifocal osteomyelitis, right ankle and foot: Secondary | ICD-10-CM | POA: Diagnosis not present

## 2016-10-19 DIAGNOSIS — L97822 Non-pressure chronic ulcer of other part of left lower leg with fat layer exposed: Secondary | ICD-10-CM | POA: Diagnosis not present

## 2016-10-19 DIAGNOSIS — L97512 Non-pressure chronic ulcer of other part of right foot with fat layer exposed: Secondary | ICD-10-CM | POA: Diagnosis not present

## 2016-10-19 DIAGNOSIS — N185 Chronic kidney disease, stage 5: Secondary | ICD-10-CM | POA: Diagnosis not present

## 2016-10-20 DIAGNOSIS — S91309A Unspecified open wound, unspecified foot, initial encounter: Secondary | ICD-10-CM | POA: Diagnosis not present

## 2016-10-20 DIAGNOSIS — N2581 Secondary hyperparathyroidism of renal origin: Secondary | ICD-10-CM | POA: Diagnosis not present

## 2016-10-20 DIAGNOSIS — N186 End stage renal disease: Secondary | ICD-10-CM | POA: Diagnosis not present

## 2016-10-20 DIAGNOSIS — E1121 Type 2 diabetes mellitus with diabetic nephropathy: Secondary | ICD-10-CM | POA: Diagnosis not present

## 2016-10-21 ENCOUNTER — Ambulatory Visit (HOSPITAL_COMMUNITY)
Admission: RE | Admit: 2016-10-21 | Discharge: 2016-10-21 | Disposition: A | Discharge status: Home / Self Care | Payer: Medicare Other | Source: Ambulatory Visit | Attending: Surgery | Admitting: Surgery

## 2016-10-21 ENCOUNTER — Other Ambulatory Visit: Payer: Self-pay | Admitting: Surgery

## 2016-10-21 DIAGNOSIS — M869 Osteomyelitis, unspecified: Secondary | ICD-10-CM

## 2016-10-21 DIAGNOSIS — E11621 Type 2 diabetes mellitus with foot ulcer: Secondary | ICD-10-CM | POA: Diagnosis not present

## 2016-10-21 DIAGNOSIS — L97519 Non-pressure chronic ulcer of other part of right foot with unspecified severity: Secondary | ICD-10-CM | POA: Diagnosis not present

## 2016-10-21 DIAGNOSIS — M86371 Chronic multifocal osteomyelitis, right ankle and foot: Secondary | ICD-10-CM | POA: Insufficient documentation

## 2016-10-22 DIAGNOSIS — N186 End stage renal disease: Secondary | ICD-10-CM | POA: Diagnosis not present

## 2016-10-22 DIAGNOSIS — S91309A Unspecified open wound, unspecified foot, initial encounter: Secondary | ICD-10-CM | POA: Diagnosis not present

## 2016-10-22 DIAGNOSIS — N2581 Secondary hyperparathyroidism of renal origin: Secondary | ICD-10-CM | POA: Diagnosis not present

## 2016-10-25 DIAGNOSIS — N2581 Secondary hyperparathyroidism of renal origin: Secondary | ICD-10-CM | POA: Diagnosis not present

## 2016-10-25 DIAGNOSIS — N186 End stage renal disease: Secondary | ICD-10-CM | POA: Diagnosis not present

## 2016-10-25 DIAGNOSIS — S91309A Unspecified open wound, unspecified foot, initial encounter: Secondary | ICD-10-CM | POA: Diagnosis not present

## 2016-10-26 DIAGNOSIS — L97822 Non-pressure chronic ulcer of other part of left lower leg with fat layer exposed: Secondary | ICD-10-CM | POA: Diagnosis not present

## 2016-10-26 DIAGNOSIS — E1169 Type 2 diabetes mellitus with other specified complication: Secondary | ICD-10-CM | POA: Diagnosis not present

## 2016-10-26 DIAGNOSIS — M86371 Chronic multifocal osteomyelitis, right ankle and foot: Secondary | ICD-10-CM | POA: Diagnosis not present

## 2016-10-26 DIAGNOSIS — L97521 Non-pressure chronic ulcer of other part of left foot limited to breakdown of skin: Secondary | ICD-10-CM | POA: Diagnosis not present

## 2016-10-26 DIAGNOSIS — E1122 Type 2 diabetes mellitus with diabetic chronic kidney disease: Secondary | ICD-10-CM | POA: Diagnosis not present

## 2016-10-26 DIAGNOSIS — E11621 Type 2 diabetes mellitus with foot ulcer: Secondary | ICD-10-CM | POA: Diagnosis not present

## 2016-10-26 DIAGNOSIS — Z992 Dependence on renal dialysis: Secondary | ICD-10-CM | POA: Diagnosis not present

## 2016-10-26 DIAGNOSIS — L97512 Non-pressure chronic ulcer of other part of right foot with fat layer exposed: Secondary | ICD-10-CM | POA: Diagnosis not present

## 2016-10-26 DIAGNOSIS — N186 End stage renal disease: Secondary | ICD-10-CM | POA: Diagnosis not present

## 2016-10-26 DIAGNOSIS — L97522 Non-pressure chronic ulcer of other part of left foot with fat layer exposed: Secondary | ICD-10-CM | POA: Diagnosis not present

## 2016-10-27 DIAGNOSIS — S91309A Unspecified open wound, unspecified foot, initial encounter: Secondary | ICD-10-CM | POA: Diagnosis not present

## 2016-10-27 DIAGNOSIS — E1121 Type 2 diabetes mellitus with diabetic nephropathy: Secondary | ICD-10-CM | POA: Diagnosis not present

## 2016-10-27 DIAGNOSIS — N186 End stage renal disease: Secondary | ICD-10-CM | POA: Diagnosis not present

## 2016-10-27 DIAGNOSIS — N2581 Secondary hyperparathyroidism of renal origin: Secondary | ICD-10-CM | POA: Diagnosis not present

## 2016-10-29 DIAGNOSIS — N186 End stage renal disease: Secondary | ICD-10-CM | POA: Diagnosis not present

## 2016-10-29 DIAGNOSIS — S91309A Unspecified open wound, unspecified foot, initial encounter: Secondary | ICD-10-CM | POA: Diagnosis not present

## 2016-10-29 DIAGNOSIS — N2581 Secondary hyperparathyroidism of renal origin: Secondary | ICD-10-CM | POA: Diagnosis not present

## 2016-10-29 DIAGNOSIS — E1121 Type 2 diabetes mellitus with diabetic nephropathy: Secondary | ICD-10-CM | POA: Diagnosis not present

## 2016-11-01 DIAGNOSIS — S91309A Unspecified open wound, unspecified foot, initial encounter: Secondary | ICD-10-CM | POA: Diagnosis not present

## 2016-11-01 DIAGNOSIS — N186 End stage renal disease: Secondary | ICD-10-CM | POA: Diagnosis not present

## 2016-11-01 DIAGNOSIS — N2581 Secondary hyperparathyroidism of renal origin: Secondary | ICD-10-CM | POA: Diagnosis not present

## 2016-11-01 DIAGNOSIS — E1121 Type 2 diabetes mellitus with diabetic nephropathy: Secondary | ICD-10-CM | POA: Diagnosis not present

## 2016-11-02 ENCOUNTER — Encounter (HOSPITAL_BASED_OUTPATIENT_CLINIC_OR_DEPARTMENT_OTHER): Payer: Medicare Other | Attending: Surgery

## 2016-11-02 DIAGNOSIS — Z992 Dependence on renal dialysis: Secondary | ICD-10-CM | POA: Diagnosis not present

## 2016-11-02 DIAGNOSIS — L97521 Non-pressure chronic ulcer of other part of left foot limited to breakdown of skin: Secondary | ICD-10-CM | POA: Diagnosis not present

## 2016-11-02 DIAGNOSIS — E1140 Type 2 diabetes mellitus with diabetic neuropathy, unspecified: Secondary | ICD-10-CM | POA: Insufficient documentation

## 2016-11-02 DIAGNOSIS — I132 Hypertensive heart and chronic kidney disease with heart failure and with stage 5 chronic kidney disease, or end stage renal disease: Secondary | ICD-10-CM | POA: Insufficient documentation

## 2016-11-02 DIAGNOSIS — L97522 Non-pressure chronic ulcer of other part of left foot with fat layer exposed: Secondary | ICD-10-CM | POA: Diagnosis not present

## 2016-11-02 DIAGNOSIS — M86371 Chronic multifocal osteomyelitis, right ankle and foot: Secondary | ICD-10-CM | POA: Diagnosis not present

## 2016-11-02 DIAGNOSIS — I509 Heart failure, unspecified: Secondary | ICD-10-CM | POA: Insufficient documentation

## 2016-11-02 DIAGNOSIS — L97512 Non-pressure chronic ulcer of other part of right foot with fat layer exposed: Secondary | ICD-10-CM | POA: Insufficient documentation

## 2016-11-02 DIAGNOSIS — E11621 Type 2 diabetes mellitus with foot ulcer: Secondary | ICD-10-CM | POA: Diagnosis not present

## 2016-11-02 DIAGNOSIS — D649 Anemia, unspecified: Secondary | ICD-10-CM | POA: Diagnosis not present

## 2016-11-02 DIAGNOSIS — I739 Peripheral vascular disease, unspecified: Secondary | ICD-10-CM | POA: Insufficient documentation

## 2016-11-02 DIAGNOSIS — E1122 Type 2 diabetes mellitus with diabetic chronic kidney disease: Secondary | ICD-10-CM | POA: Diagnosis not present

## 2016-11-02 DIAGNOSIS — N185 Chronic kidney disease, stage 5: Secondary | ICD-10-CM | POA: Insufficient documentation

## 2016-11-03 DIAGNOSIS — N2581 Secondary hyperparathyroidism of renal origin: Secondary | ICD-10-CM | POA: Diagnosis not present

## 2016-11-03 DIAGNOSIS — E1121 Type 2 diabetes mellitus with diabetic nephropathy: Secondary | ICD-10-CM | POA: Diagnosis not present

## 2016-11-03 DIAGNOSIS — S91309A Unspecified open wound, unspecified foot, initial encounter: Secondary | ICD-10-CM | POA: Diagnosis not present

## 2016-11-03 DIAGNOSIS — N186 End stage renal disease: Secondary | ICD-10-CM | POA: Diagnosis not present

## 2016-11-05 DIAGNOSIS — E1122 Type 2 diabetes mellitus with diabetic chronic kidney disease: Secondary | ICD-10-CM | POA: Diagnosis not present

## 2016-11-05 DIAGNOSIS — L97522 Non-pressure chronic ulcer of other part of left foot with fat layer exposed: Secondary | ICD-10-CM | POA: Diagnosis not present

## 2016-11-05 DIAGNOSIS — N186 End stage renal disease: Secondary | ICD-10-CM | POA: Diagnosis not present

## 2016-11-05 DIAGNOSIS — M86371 Chronic multifocal osteomyelitis, right ankle and foot: Secondary | ICD-10-CM | POA: Diagnosis not present

## 2016-11-05 DIAGNOSIS — N2581 Secondary hyperparathyroidism of renal origin: Secondary | ICD-10-CM | POA: Diagnosis not present

## 2016-11-05 DIAGNOSIS — E11621 Type 2 diabetes mellitus with foot ulcer: Secondary | ICD-10-CM | POA: Diagnosis not present

## 2016-11-05 DIAGNOSIS — L97521 Non-pressure chronic ulcer of other part of left foot limited to breakdown of skin: Secondary | ICD-10-CM | POA: Diagnosis not present

## 2016-11-05 DIAGNOSIS — N185 Chronic kidney disease, stage 5: Secondary | ICD-10-CM | POA: Diagnosis not present

## 2016-11-05 DIAGNOSIS — L97512 Non-pressure chronic ulcer of other part of right foot with fat layer exposed: Secondary | ICD-10-CM | POA: Diagnosis not present

## 2016-11-05 DIAGNOSIS — E1121 Type 2 diabetes mellitus with diabetic nephropathy: Secondary | ICD-10-CM | POA: Diagnosis not present

## 2016-11-05 DIAGNOSIS — S91309A Unspecified open wound, unspecified foot, initial encounter: Secondary | ICD-10-CM | POA: Diagnosis not present

## 2016-11-05 LAB — GLUCOSE, CAPILLARY
Glucose-Capillary: 180 mg/dL — ABNORMAL HIGH (ref 65–99)
Glucose-Capillary: 177 mg/dL — ABNORMAL HIGH (ref 65–99)

## 2016-11-08 DIAGNOSIS — H6983 Other specified disorders of Eustachian tube, bilateral: Secondary | ICD-10-CM | POA: Diagnosis not present

## 2016-11-08 DIAGNOSIS — S91309A Unspecified open wound, unspecified foot, initial encounter: Secondary | ICD-10-CM | POA: Diagnosis not present

## 2016-11-08 DIAGNOSIS — E1121 Type 2 diabetes mellitus with diabetic nephropathy: Secondary | ICD-10-CM | POA: Diagnosis not present

## 2016-11-08 DIAGNOSIS — N186 End stage renal disease: Secondary | ICD-10-CM | POA: Diagnosis not present

## 2016-11-08 DIAGNOSIS — N2581 Secondary hyperparathyroidism of renal origin: Secondary | ICD-10-CM | POA: Diagnosis not present

## 2016-11-09 DIAGNOSIS — L97512 Non-pressure chronic ulcer of other part of right foot with fat layer exposed: Secondary | ICD-10-CM | POA: Diagnosis not present

## 2016-11-09 DIAGNOSIS — L97522 Non-pressure chronic ulcer of other part of left foot with fat layer exposed: Secondary | ICD-10-CM | POA: Diagnosis not present

## 2016-11-09 DIAGNOSIS — M86671 Other chronic osteomyelitis, right ankle and foot: Secondary | ICD-10-CM | POA: Diagnosis not present

## 2016-11-09 DIAGNOSIS — N185 Chronic kidney disease, stage 5: Secondary | ICD-10-CM | POA: Diagnosis not present

## 2016-11-09 DIAGNOSIS — E1122 Type 2 diabetes mellitus with diabetic chronic kidney disease: Secondary | ICD-10-CM | POA: Diagnosis not present

## 2016-11-09 DIAGNOSIS — E11621 Type 2 diabetes mellitus with foot ulcer: Secondary | ICD-10-CM | POA: Diagnosis not present

## 2016-11-09 DIAGNOSIS — L97521 Non-pressure chronic ulcer of other part of left foot limited to breakdown of skin: Secondary | ICD-10-CM | POA: Diagnosis not present

## 2016-11-09 LAB — GLUCOSE, CAPILLARY
GLUCOSE-CAPILLARY: 178 mg/dL — AB (ref 65–99)
Glucose-Capillary: 196 mg/dL — ABNORMAL HIGH (ref 65–99)

## 2016-11-10 DIAGNOSIS — N186 End stage renal disease: Secondary | ICD-10-CM | POA: Diagnosis not present

## 2016-11-10 DIAGNOSIS — M86371 Chronic multifocal osteomyelitis, right ankle and foot: Secondary | ICD-10-CM | POA: Diagnosis not present

## 2016-11-10 DIAGNOSIS — N2581 Secondary hyperparathyroidism of renal origin: Secondary | ICD-10-CM | POA: Diagnosis not present

## 2016-11-10 DIAGNOSIS — E11621 Type 2 diabetes mellitus with foot ulcer: Secondary | ICD-10-CM | POA: Diagnosis not present

## 2016-11-10 DIAGNOSIS — L97512 Non-pressure chronic ulcer of other part of right foot with fat layer exposed: Secondary | ICD-10-CM | POA: Diagnosis not present

## 2016-11-10 DIAGNOSIS — L97521 Non-pressure chronic ulcer of other part of left foot limited to breakdown of skin: Secondary | ICD-10-CM | POA: Diagnosis not present

## 2016-11-10 DIAGNOSIS — N185 Chronic kidney disease, stage 5: Secondary | ICD-10-CM | POA: Diagnosis not present

## 2016-11-10 DIAGNOSIS — S91309A Unspecified open wound, unspecified foot, initial encounter: Secondary | ICD-10-CM | POA: Diagnosis not present

## 2016-11-10 DIAGNOSIS — E1121 Type 2 diabetes mellitus with diabetic nephropathy: Secondary | ICD-10-CM | POA: Diagnosis not present

## 2016-11-10 DIAGNOSIS — L97522 Non-pressure chronic ulcer of other part of left foot with fat layer exposed: Secondary | ICD-10-CM | POA: Diagnosis not present

## 2016-11-10 DIAGNOSIS — E1122 Type 2 diabetes mellitus with diabetic chronic kidney disease: Secondary | ICD-10-CM | POA: Diagnosis not present

## 2016-11-10 LAB — GLUCOSE, CAPILLARY
GLUCOSE-CAPILLARY: 165 mg/dL — AB (ref 65–99)
GLUCOSE-CAPILLARY: 153 mg/dL — AB (ref 65–99)

## 2016-11-11 DIAGNOSIS — M86371 Chronic multifocal osteomyelitis, right ankle and foot: Secondary | ICD-10-CM | POA: Diagnosis not present

## 2016-11-11 DIAGNOSIS — N185 Chronic kidney disease, stage 5: Secondary | ICD-10-CM | POA: Diagnosis not present

## 2016-11-11 DIAGNOSIS — E11621 Type 2 diabetes mellitus with foot ulcer: Secondary | ICD-10-CM | POA: Diagnosis not present

## 2016-11-11 DIAGNOSIS — L97521 Non-pressure chronic ulcer of other part of left foot limited to breakdown of skin: Secondary | ICD-10-CM | POA: Diagnosis not present

## 2016-11-11 DIAGNOSIS — L97512 Non-pressure chronic ulcer of other part of right foot with fat layer exposed: Secondary | ICD-10-CM | POA: Diagnosis not present

## 2016-11-11 DIAGNOSIS — E1122 Type 2 diabetes mellitus with diabetic chronic kidney disease: Secondary | ICD-10-CM | POA: Diagnosis not present

## 2016-11-11 DIAGNOSIS — L97522 Non-pressure chronic ulcer of other part of left foot with fat layer exposed: Secondary | ICD-10-CM | POA: Diagnosis not present

## 2016-11-11 LAB — GLUCOSE, CAPILLARY
GLUCOSE-CAPILLARY: 150 mg/dL — AB (ref 65–99)
Glucose-Capillary: 191 mg/dL — ABNORMAL HIGH (ref 65–99)

## 2016-11-12 DIAGNOSIS — N186 End stage renal disease: Secondary | ICD-10-CM | POA: Diagnosis not present

## 2016-11-12 DIAGNOSIS — H6983 Other specified disorders of Eustachian tube, bilateral: Secondary | ICD-10-CM | POA: Diagnosis not present

## 2016-11-12 DIAGNOSIS — S91309A Unspecified open wound, unspecified foot, initial encounter: Secondary | ICD-10-CM | POA: Diagnosis not present

## 2016-11-12 DIAGNOSIS — E1121 Type 2 diabetes mellitus with diabetic nephropathy: Secondary | ICD-10-CM | POA: Diagnosis not present

## 2016-11-12 DIAGNOSIS — H6503 Acute serous otitis media, bilateral: Secondary | ICD-10-CM | POA: Diagnosis not present

## 2016-11-12 DIAGNOSIS — N2581 Secondary hyperparathyroidism of renal origin: Secondary | ICD-10-CM | POA: Diagnosis not present

## 2016-11-14 DIAGNOSIS — N2581 Secondary hyperparathyroidism of renal origin: Secondary | ICD-10-CM | POA: Diagnosis not present

## 2016-11-14 DIAGNOSIS — E1121 Type 2 diabetes mellitus with diabetic nephropathy: Secondary | ICD-10-CM | POA: Diagnosis not present

## 2016-11-14 DIAGNOSIS — N186 End stage renal disease: Secondary | ICD-10-CM | POA: Diagnosis not present

## 2016-11-14 DIAGNOSIS — S91309A Unspecified open wound, unspecified foot, initial encounter: Secondary | ICD-10-CM | POA: Diagnosis not present

## 2016-11-15 DIAGNOSIS — L97522 Non-pressure chronic ulcer of other part of left foot with fat layer exposed: Secondary | ICD-10-CM | POA: Diagnosis not present

## 2016-11-15 DIAGNOSIS — E11621 Type 2 diabetes mellitus with foot ulcer: Secondary | ICD-10-CM | POA: Diagnosis not present

## 2016-11-15 DIAGNOSIS — L97512 Non-pressure chronic ulcer of other part of right foot with fat layer exposed: Secondary | ICD-10-CM | POA: Diagnosis not present

## 2016-11-15 DIAGNOSIS — N185 Chronic kidney disease, stage 5: Secondary | ICD-10-CM | POA: Diagnosis not present

## 2016-11-15 DIAGNOSIS — E1122 Type 2 diabetes mellitus with diabetic chronic kidney disease: Secondary | ICD-10-CM | POA: Diagnosis not present

## 2016-11-15 DIAGNOSIS — M86671 Other chronic osteomyelitis, right ankle and foot: Secondary | ICD-10-CM | POA: Diagnosis not present

## 2016-11-15 DIAGNOSIS — L97521 Non-pressure chronic ulcer of other part of left foot limited to breakdown of skin: Secondary | ICD-10-CM | POA: Diagnosis not present

## 2016-11-15 LAB — GLUCOSE, CAPILLARY
GLUCOSE-CAPILLARY: 164 mg/dL — AB (ref 65–99)
Glucose-Capillary: 199 mg/dL — ABNORMAL HIGH (ref 65–99)

## 2016-11-16 DIAGNOSIS — L97522 Non-pressure chronic ulcer of other part of left foot with fat layer exposed: Secondary | ICD-10-CM | POA: Diagnosis not present

## 2016-11-16 DIAGNOSIS — E1122 Type 2 diabetes mellitus with diabetic chronic kidney disease: Secondary | ICD-10-CM | POA: Diagnosis not present

## 2016-11-16 DIAGNOSIS — M86371 Chronic multifocal osteomyelitis, right ankle and foot: Secondary | ICD-10-CM | POA: Diagnosis not present

## 2016-11-16 DIAGNOSIS — L97512 Non-pressure chronic ulcer of other part of right foot with fat layer exposed: Secondary | ICD-10-CM | POA: Diagnosis not present

## 2016-11-16 DIAGNOSIS — L97521 Non-pressure chronic ulcer of other part of left foot limited to breakdown of skin: Secondary | ICD-10-CM | POA: Diagnosis not present

## 2016-11-16 DIAGNOSIS — N185 Chronic kidney disease, stage 5: Secondary | ICD-10-CM | POA: Diagnosis not present

## 2016-11-16 DIAGNOSIS — E11621 Type 2 diabetes mellitus with foot ulcer: Secondary | ICD-10-CM | POA: Diagnosis not present

## 2016-11-16 LAB — GLUCOSE, CAPILLARY
GLUCOSE-CAPILLARY: 175 mg/dL — AB (ref 65–99)
Glucose-Capillary: 162 mg/dL — ABNORMAL HIGH (ref 65–99)

## 2016-11-17 DIAGNOSIS — L97522 Non-pressure chronic ulcer of other part of left foot with fat layer exposed: Secondary | ICD-10-CM | POA: Diagnosis not present

## 2016-11-17 DIAGNOSIS — L97521 Non-pressure chronic ulcer of other part of left foot limited to breakdown of skin: Secondary | ICD-10-CM | POA: Diagnosis not present

## 2016-11-17 DIAGNOSIS — N185 Chronic kidney disease, stage 5: Secondary | ICD-10-CM | POA: Diagnosis not present

## 2016-11-17 DIAGNOSIS — M86371 Chronic multifocal osteomyelitis, right ankle and foot: Secondary | ICD-10-CM | POA: Diagnosis not present

## 2016-11-17 DIAGNOSIS — E1121 Type 2 diabetes mellitus with diabetic nephropathy: Secondary | ICD-10-CM | POA: Diagnosis not present

## 2016-11-17 DIAGNOSIS — N186 End stage renal disease: Secondary | ICD-10-CM | POA: Diagnosis not present

## 2016-11-17 DIAGNOSIS — S91309A Unspecified open wound, unspecified foot, initial encounter: Secondary | ICD-10-CM | POA: Diagnosis not present

## 2016-11-17 DIAGNOSIS — L97512 Non-pressure chronic ulcer of other part of right foot with fat layer exposed: Secondary | ICD-10-CM | POA: Diagnosis not present

## 2016-11-17 DIAGNOSIS — E11621 Type 2 diabetes mellitus with foot ulcer: Secondary | ICD-10-CM | POA: Diagnosis not present

## 2016-11-17 DIAGNOSIS — N2581 Secondary hyperparathyroidism of renal origin: Secondary | ICD-10-CM | POA: Diagnosis not present

## 2016-11-17 DIAGNOSIS — E1122 Type 2 diabetes mellitus with diabetic chronic kidney disease: Secondary | ICD-10-CM | POA: Diagnosis not present

## 2016-11-17 LAB — GLUCOSE, CAPILLARY
Glucose-Capillary: 179 mg/dL — ABNORMAL HIGH (ref 65–99)
Glucose-Capillary: 166 mg/dL — ABNORMAL HIGH (ref 65–99)

## 2016-11-19 DIAGNOSIS — N186 End stage renal disease: Secondary | ICD-10-CM | POA: Diagnosis not present

## 2016-11-19 DIAGNOSIS — E1121 Type 2 diabetes mellitus with diabetic nephropathy: Secondary | ICD-10-CM | POA: Diagnosis not present

## 2016-11-19 DIAGNOSIS — N2581 Secondary hyperparathyroidism of renal origin: Secondary | ICD-10-CM | POA: Diagnosis not present

## 2016-11-19 DIAGNOSIS — S91309A Unspecified open wound, unspecified foot, initial encounter: Secondary | ICD-10-CM | POA: Diagnosis not present

## 2016-11-22 DIAGNOSIS — E1122 Type 2 diabetes mellitus with diabetic chronic kidney disease: Secondary | ICD-10-CM | POA: Diagnosis not present

## 2016-11-22 DIAGNOSIS — M86371 Chronic multifocal osteomyelitis, right ankle and foot: Secondary | ICD-10-CM | POA: Diagnosis not present

## 2016-11-22 DIAGNOSIS — L97512 Non-pressure chronic ulcer of other part of right foot with fat layer exposed: Secondary | ICD-10-CM | POA: Diagnosis not present

## 2016-11-22 DIAGNOSIS — M8668 Other chronic osteomyelitis, other site: Secondary | ICD-10-CM | POA: Diagnosis not present

## 2016-11-22 DIAGNOSIS — S91309A Unspecified open wound, unspecified foot, initial encounter: Secondary | ICD-10-CM | POA: Diagnosis not present

## 2016-11-22 DIAGNOSIS — L97521 Non-pressure chronic ulcer of other part of left foot limited to breakdown of skin: Secondary | ICD-10-CM | POA: Diagnosis not present

## 2016-11-22 DIAGNOSIS — E11621 Type 2 diabetes mellitus with foot ulcer: Secondary | ICD-10-CM | POA: Diagnosis not present

## 2016-11-22 DIAGNOSIS — N185 Chronic kidney disease, stage 5: Secondary | ICD-10-CM | POA: Diagnosis not present

## 2016-11-22 DIAGNOSIS — N2581 Secondary hyperparathyroidism of renal origin: Secondary | ICD-10-CM | POA: Diagnosis not present

## 2016-11-22 DIAGNOSIS — N186 End stage renal disease: Secondary | ICD-10-CM | POA: Diagnosis not present

## 2016-11-22 DIAGNOSIS — L97522 Non-pressure chronic ulcer of other part of left foot with fat layer exposed: Secondary | ICD-10-CM | POA: Diagnosis not present

## 2016-11-22 DIAGNOSIS — E1121 Type 2 diabetes mellitus with diabetic nephropathy: Secondary | ICD-10-CM | POA: Diagnosis not present

## 2016-11-22 LAB — GLUCOSE, CAPILLARY
GLUCOSE-CAPILLARY: 172 mg/dL — AB (ref 65–99)
Glucose-Capillary: 147 mg/dL — ABNORMAL HIGH (ref 65–99)

## 2016-11-23 DIAGNOSIS — L97512 Non-pressure chronic ulcer of other part of right foot with fat layer exposed: Secondary | ICD-10-CM | POA: Diagnosis not present

## 2016-11-23 DIAGNOSIS — L97521 Non-pressure chronic ulcer of other part of left foot limited to breakdown of skin: Secondary | ICD-10-CM | POA: Diagnosis not present

## 2016-11-23 DIAGNOSIS — L97522 Non-pressure chronic ulcer of other part of left foot with fat layer exposed: Secondary | ICD-10-CM | POA: Diagnosis not present

## 2016-11-23 DIAGNOSIS — M8668 Other chronic osteomyelitis, other site: Secondary | ICD-10-CM | POA: Diagnosis not present

## 2016-11-23 DIAGNOSIS — E11621 Type 2 diabetes mellitus with foot ulcer: Secondary | ICD-10-CM | POA: Diagnosis not present

## 2016-11-23 DIAGNOSIS — M86371 Chronic multifocal osteomyelitis, right ankle and foot: Secondary | ICD-10-CM | POA: Diagnosis not present

## 2016-11-23 DIAGNOSIS — N185 Chronic kidney disease, stage 5: Secondary | ICD-10-CM | POA: Diagnosis not present

## 2016-11-23 DIAGNOSIS — E1122 Type 2 diabetes mellitus with diabetic chronic kidney disease: Secondary | ICD-10-CM | POA: Diagnosis not present

## 2016-11-23 LAB — GLUCOSE, CAPILLARY
GLUCOSE-CAPILLARY: 123 mg/dL — AB (ref 65–99)
GLUCOSE-CAPILLARY: 166 mg/dL — AB (ref 65–99)
GLUCOSE-CAPILLARY: 136 mg/dL — AB (ref 65–99)

## 2016-11-24 DIAGNOSIS — E1122 Type 2 diabetes mellitus with diabetic chronic kidney disease: Secondary | ICD-10-CM | POA: Diagnosis not present

## 2016-11-24 DIAGNOSIS — N186 End stage renal disease: Secondary | ICD-10-CM | POA: Diagnosis not present

## 2016-11-24 DIAGNOSIS — S91309A Unspecified open wound, unspecified foot, initial encounter: Secondary | ICD-10-CM | POA: Diagnosis not present

## 2016-11-24 DIAGNOSIS — M8668 Other chronic osteomyelitis, other site: Secondary | ICD-10-CM | POA: Diagnosis not present

## 2016-11-24 DIAGNOSIS — L97521 Non-pressure chronic ulcer of other part of left foot limited to breakdown of skin: Secondary | ICD-10-CM | POA: Diagnosis not present

## 2016-11-24 DIAGNOSIS — L97522 Non-pressure chronic ulcer of other part of left foot with fat layer exposed: Secondary | ICD-10-CM | POA: Diagnosis not present

## 2016-11-24 DIAGNOSIS — E1121 Type 2 diabetes mellitus with diabetic nephropathy: Secondary | ICD-10-CM | POA: Diagnosis not present

## 2016-11-24 DIAGNOSIS — N185 Chronic kidney disease, stage 5: Secondary | ICD-10-CM | POA: Diagnosis not present

## 2016-11-24 DIAGNOSIS — L97512 Non-pressure chronic ulcer of other part of right foot with fat layer exposed: Secondary | ICD-10-CM | POA: Diagnosis not present

## 2016-11-24 DIAGNOSIS — N2581 Secondary hyperparathyroidism of renal origin: Secondary | ICD-10-CM | POA: Diagnosis not present

## 2016-11-24 DIAGNOSIS — M86371 Chronic multifocal osteomyelitis, right ankle and foot: Secondary | ICD-10-CM | POA: Diagnosis not present

## 2016-11-24 DIAGNOSIS — E11621 Type 2 diabetes mellitus with foot ulcer: Secondary | ICD-10-CM | POA: Diagnosis not present

## 2016-11-24 LAB — GLUCOSE, CAPILLARY
GLUCOSE-CAPILLARY: 158 mg/dL — AB (ref 65–99)
Glucose-Capillary: 181 mg/dL — ABNORMAL HIGH (ref 65–99)

## 2016-11-25 DIAGNOSIS — E11621 Type 2 diabetes mellitus with foot ulcer: Secondary | ICD-10-CM | POA: Diagnosis not present

## 2016-11-25 DIAGNOSIS — E1122 Type 2 diabetes mellitus with diabetic chronic kidney disease: Secondary | ICD-10-CM | POA: Diagnosis not present

## 2016-11-25 DIAGNOSIS — N185 Chronic kidney disease, stage 5: Secondary | ICD-10-CM | POA: Diagnosis not present

## 2016-11-25 DIAGNOSIS — M86371 Chronic multifocal osteomyelitis, right ankle and foot: Secondary | ICD-10-CM | POA: Diagnosis not present

## 2016-11-25 DIAGNOSIS — L97522 Non-pressure chronic ulcer of other part of left foot with fat layer exposed: Secondary | ICD-10-CM | POA: Diagnosis not present

## 2016-11-25 DIAGNOSIS — N186 End stage renal disease: Secondary | ICD-10-CM | POA: Diagnosis not present

## 2016-11-25 DIAGNOSIS — Z992 Dependence on renal dialysis: Secondary | ICD-10-CM | POA: Diagnosis not present

## 2016-11-25 DIAGNOSIS — M8668 Other chronic osteomyelitis, other site: Secondary | ICD-10-CM | POA: Diagnosis not present

## 2016-11-25 DIAGNOSIS — L97512 Non-pressure chronic ulcer of other part of right foot with fat layer exposed: Secondary | ICD-10-CM | POA: Diagnosis not present

## 2016-11-25 DIAGNOSIS — L97521 Non-pressure chronic ulcer of other part of left foot limited to breakdown of skin: Secondary | ICD-10-CM | POA: Diagnosis not present

## 2016-11-25 LAB — GLUCOSE, CAPILLARY
GLUCOSE-CAPILLARY: 175 mg/dL — AB (ref 65–99)
Glucose-Capillary: 191 mg/dL — ABNORMAL HIGH (ref 65–99)

## 2016-11-26 ENCOUNTER — Encounter (HOSPITAL_BASED_OUTPATIENT_CLINIC_OR_DEPARTMENT_OTHER): Payer: Medicare Other | Attending: Internal Medicine

## 2016-11-26 DIAGNOSIS — E1121 Type 2 diabetes mellitus with diabetic nephropathy: Secondary | ICD-10-CM | POA: Diagnosis not present

## 2016-11-26 DIAGNOSIS — N2581 Secondary hyperparathyroidism of renal origin: Secondary | ICD-10-CM | POA: Diagnosis not present

## 2016-11-26 DIAGNOSIS — L97511 Non-pressure chronic ulcer of other part of right foot limited to breakdown of skin: Secondary | ICD-10-CM | POA: Diagnosis not present

## 2016-11-26 DIAGNOSIS — S91309A Unspecified open wound, unspecified foot, initial encounter: Secondary | ICD-10-CM | POA: Diagnosis not present

## 2016-11-26 DIAGNOSIS — N186 End stage renal disease: Secondary | ICD-10-CM | POA: Diagnosis not present

## 2016-11-26 DIAGNOSIS — E11621 Type 2 diabetes mellitus with foot ulcer: Secondary | ICD-10-CM | POA: Insufficient documentation

## 2016-11-26 DIAGNOSIS — N185 Chronic kidney disease, stage 5: Secondary | ICD-10-CM | POA: Diagnosis not present

## 2016-11-26 DIAGNOSIS — E1122 Type 2 diabetes mellitus with diabetic chronic kidney disease: Secondary | ICD-10-CM | POA: Diagnosis not present

## 2016-11-26 DIAGNOSIS — L97521 Non-pressure chronic ulcer of other part of left foot limited to breakdown of skin: Secondary | ICD-10-CM | POA: Diagnosis not present

## 2016-11-26 DIAGNOSIS — L97512 Non-pressure chronic ulcer of other part of right foot with fat layer exposed: Secondary | ICD-10-CM | POA: Diagnosis not present

## 2016-11-26 DIAGNOSIS — Z992 Dependence on renal dialysis: Secondary | ICD-10-CM | POA: Insufficient documentation

## 2016-11-26 DIAGNOSIS — M8668 Other chronic osteomyelitis, other site: Secondary | ICD-10-CM | POA: Diagnosis not present

## 2016-11-26 DIAGNOSIS — L97522 Non-pressure chronic ulcer of other part of left foot with fat layer exposed: Secondary | ICD-10-CM | POA: Diagnosis not present

## 2016-11-26 DIAGNOSIS — I120 Hypertensive chronic kidney disease with stage 5 chronic kidney disease or end stage renal disease: Secondary | ICD-10-CM | POA: Insufficient documentation

## 2016-11-26 DIAGNOSIS — M86371 Chronic multifocal osteomyelitis, right ankle and foot: Secondary | ICD-10-CM | POA: Diagnosis not present

## 2016-11-26 DIAGNOSIS — D631 Anemia in chronic kidney disease: Secondary | ICD-10-CM | POA: Diagnosis not present

## 2016-11-26 LAB — GLUCOSE, CAPILLARY
GLUCOSE-CAPILLARY: 151 mg/dL — AB (ref 65–99)
GLUCOSE-CAPILLARY: 117 mg/dL — AB (ref 65–99)

## 2016-11-29 DIAGNOSIS — M86371 Chronic multifocal osteomyelitis, right ankle and foot: Secondary | ICD-10-CM | POA: Diagnosis not present

## 2016-11-29 DIAGNOSIS — M8668 Other chronic osteomyelitis, other site: Secondary | ICD-10-CM | POA: Diagnosis not present

## 2016-11-29 DIAGNOSIS — S91309A Unspecified open wound, unspecified foot, initial encounter: Secondary | ICD-10-CM | POA: Diagnosis not present

## 2016-11-29 DIAGNOSIS — N2581 Secondary hyperparathyroidism of renal origin: Secondary | ICD-10-CM | POA: Diagnosis not present

## 2016-11-29 DIAGNOSIS — N186 End stage renal disease: Secondary | ICD-10-CM | POA: Diagnosis not present

## 2016-11-29 DIAGNOSIS — L97512 Non-pressure chronic ulcer of other part of right foot with fat layer exposed: Secondary | ICD-10-CM | POA: Diagnosis not present

## 2016-11-29 DIAGNOSIS — L97522 Non-pressure chronic ulcer of other part of left foot with fat layer exposed: Secondary | ICD-10-CM | POA: Diagnosis not present

## 2016-11-29 DIAGNOSIS — D631 Anemia in chronic kidney disease: Secondary | ICD-10-CM | POA: Diagnosis not present

## 2016-11-29 DIAGNOSIS — L97511 Non-pressure chronic ulcer of other part of right foot limited to breakdown of skin: Secondary | ICD-10-CM | POA: Diagnosis not present

## 2016-11-29 DIAGNOSIS — L97521 Non-pressure chronic ulcer of other part of left foot limited to breakdown of skin: Secondary | ICD-10-CM | POA: Diagnosis not present

## 2016-11-29 DIAGNOSIS — E1121 Type 2 diabetes mellitus with diabetic nephropathy: Secondary | ICD-10-CM | POA: Diagnosis not present

## 2016-11-29 DIAGNOSIS — E11621 Type 2 diabetes mellitus with foot ulcer: Secondary | ICD-10-CM | POA: Diagnosis not present

## 2016-11-29 LAB — GLUCOSE, CAPILLARY
Glucose-Capillary: 193 mg/dL — ABNORMAL HIGH (ref 65–99)
Glucose-Capillary: 109 mg/dL — ABNORMAL HIGH (ref 65–99)

## 2016-11-30 ENCOUNTER — Other Ambulatory Visit: Payer: Self-pay | Admitting: Surgery

## 2016-11-30 ENCOUNTER — Ambulatory Visit (HOSPITAL_COMMUNITY)
Admission: RE | Admit: 2016-11-30 | Discharge: 2016-11-30 | Disposition: A | Discharge status: Home / Self Care | Payer: Medicare Other | Source: Ambulatory Visit | Attending: Surgery | Admitting: Surgery

## 2016-11-30 DIAGNOSIS — M8668 Other chronic osteomyelitis, other site: Secondary | ICD-10-CM | POA: Diagnosis not present

## 2016-11-30 DIAGNOSIS — E11621 Type 2 diabetes mellitus with foot ulcer: Secondary | ICD-10-CM | POA: Insufficient documentation

## 2016-11-30 DIAGNOSIS — Z992 Dependence on renal dialysis: Secondary | ICD-10-CM | POA: Diagnosis not present

## 2016-11-30 DIAGNOSIS — M869 Osteomyelitis, unspecified: Principal | ICD-10-CM

## 2016-11-30 DIAGNOSIS — M86371 Chronic multifocal osteomyelitis, right ankle and foot: Secondary | ICD-10-CM | POA: Diagnosis not present

## 2016-11-30 DIAGNOSIS — E1169 Type 2 diabetes mellitus with other specified complication: Secondary | ICD-10-CM | POA: Insufficient documentation

## 2016-11-30 DIAGNOSIS — L97511 Non-pressure chronic ulcer of other part of right foot limited to breakdown of skin: Secondary | ICD-10-CM | POA: Diagnosis not present

## 2016-11-30 DIAGNOSIS — M86671 Other chronic osteomyelitis, right ankle and foot: Secondary | ICD-10-CM | POA: Insufficient documentation

## 2016-11-30 DIAGNOSIS — L97522 Non-pressure chronic ulcer of other part of left foot with fat layer exposed: Secondary | ICD-10-CM | POA: Diagnosis not present

## 2016-11-30 DIAGNOSIS — Z89421 Acquired absence of other right toe(s): Secondary | ICD-10-CM | POA: Insufficient documentation

## 2016-11-30 DIAGNOSIS — L97519 Non-pressure chronic ulcer of other part of right foot with unspecified severity: Secondary | ICD-10-CM | POA: Insufficient documentation

## 2016-11-30 DIAGNOSIS — L97509 Non-pressure chronic ulcer of other part of unspecified foot with unspecified severity: Principal | ICD-10-CM

## 2016-11-30 DIAGNOSIS — N185 Chronic kidney disease, stage 5: Secondary | ICD-10-CM | POA: Diagnosis not present

## 2016-11-30 DIAGNOSIS — L97521 Non-pressure chronic ulcer of other part of left foot limited to breakdown of skin: Secondary | ICD-10-CM | POA: Diagnosis not present

## 2016-11-30 DIAGNOSIS — L97512 Non-pressure chronic ulcer of other part of right foot with fat layer exposed: Secondary | ICD-10-CM | POA: Diagnosis not present

## 2016-11-30 LAB — GLUCOSE, CAPILLARY
GLUCOSE-CAPILLARY: 180 mg/dL — AB (ref 65–99)
Glucose-Capillary: 149 mg/dL — ABNORMAL HIGH (ref 65–99)

## 2016-12-01 DIAGNOSIS — M8668 Other chronic osteomyelitis, other site: Secondary | ICD-10-CM | POA: Diagnosis not present

## 2016-12-01 DIAGNOSIS — L97512 Non-pressure chronic ulcer of other part of right foot with fat layer exposed: Secondary | ICD-10-CM | POA: Diagnosis not present

## 2016-12-01 DIAGNOSIS — D631 Anemia in chronic kidney disease: Secondary | ICD-10-CM | POA: Diagnosis not present

## 2016-12-01 DIAGNOSIS — M86371 Chronic multifocal osteomyelitis, right ankle and foot: Secondary | ICD-10-CM | POA: Diagnosis not present

## 2016-12-01 DIAGNOSIS — E11621 Type 2 diabetes mellitus with foot ulcer: Secondary | ICD-10-CM | POA: Diagnosis not present

## 2016-12-01 DIAGNOSIS — L97522 Non-pressure chronic ulcer of other part of left foot with fat layer exposed: Secondary | ICD-10-CM | POA: Diagnosis not present

## 2016-12-01 DIAGNOSIS — E1121 Type 2 diabetes mellitus with diabetic nephropathy: Secondary | ICD-10-CM | POA: Diagnosis not present

## 2016-12-01 DIAGNOSIS — S91309A Unspecified open wound, unspecified foot, initial encounter: Secondary | ICD-10-CM | POA: Diagnosis not present

## 2016-12-01 DIAGNOSIS — N186 End stage renal disease: Secondary | ICD-10-CM | POA: Diagnosis not present

## 2016-12-01 DIAGNOSIS — L97511 Non-pressure chronic ulcer of other part of right foot limited to breakdown of skin: Secondary | ICD-10-CM | POA: Diagnosis not present

## 2016-12-01 DIAGNOSIS — L97521 Non-pressure chronic ulcer of other part of left foot limited to breakdown of skin: Secondary | ICD-10-CM | POA: Diagnosis not present

## 2016-12-01 DIAGNOSIS — N2581 Secondary hyperparathyroidism of renal origin: Secondary | ICD-10-CM | POA: Diagnosis not present

## 2016-12-01 LAB — GLUCOSE, CAPILLARY
Glucose-Capillary: 243 mg/dL — ABNORMAL HIGH (ref 65–99)
Glucose-Capillary: 197 mg/dL — ABNORMAL HIGH (ref 65–99)

## 2016-12-02 DIAGNOSIS — L97522 Non-pressure chronic ulcer of other part of left foot with fat layer exposed: Secondary | ICD-10-CM | POA: Diagnosis not present

## 2016-12-02 DIAGNOSIS — E11621 Type 2 diabetes mellitus with foot ulcer: Secondary | ICD-10-CM | POA: Diagnosis not present

## 2016-12-02 DIAGNOSIS — L97512 Non-pressure chronic ulcer of other part of right foot with fat layer exposed: Secondary | ICD-10-CM | POA: Diagnosis not present

## 2016-12-02 DIAGNOSIS — M8668 Other chronic osteomyelitis, other site: Secondary | ICD-10-CM | POA: Diagnosis not present

## 2016-12-02 DIAGNOSIS — L97511 Non-pressure chronic ulcer of other part of right foot limited to breakdown of skin: Secondary | ICD-10-CM | POA: Diagnosis not present

## 2016-12-02 DIAGNOSIS — L97521 Non-pressure chronic ulcer of other part of left foot limited to breakdown of skin: Secondary | ICD-10-CM | POA: Diagnosis not present

## 2016-12-02 DIAGNOSIS — M86371 Chronic multifocal osteomyelitis, right ankle and foot: Secondary | ICD-10-CM | POA: Diagnosis not present

## 2016-12-02 LAB — GLUCOSE, CAPILLARY
Glucose-Capillary: 153 mg/dL — ABNORMAL HIGH (ref 65–99)
Glucose-Capillary: 152 mg/dL — ABNORMAL HIGH (ref 65–99)

## 2016-12-03 DIAGNOSIS — N186 End stage renal disease: Secondary | ICD-10-CM | POA: Diagnosis not present

## 2016-12-03 DIAGNOSIS — S91309A Unspecified open wound, unspecified foot, initial encounter: Secondary | ICD-10-CM | POA: Diagnosis not present

## 2016-12-03 DIAGNOSIS — L97512 Non-pressure chronic ulcer of other part of right foot with fat layer exposed: Secondary | ICD-10-CM | POA: Diagnosis not present

## 2016-12-03 DIAGNOSIS — D631 Anemia in chronic kidney disease: Secondary | ICD-10-CM | POA: Diagnosis not present

## 2016-12-03 DIAGNOSIS — E11621 Type 2 diabetes mellitus with foot ulcer: Secondary | ICD-10-CM | POA: Diagnosis not present

## 2016-12-03 DIAGNOSIS — M8668 Other chronic osteomyelitis, other site: Secondary | ICD-10-CM | POA: Diagnosis not present

## 2016-12-03 DIAGNOSIS — L97511 Non-pressure chronic ulcer of other part of right foot limited to breakdown of skin: Secondary | ICD-10-CM | POA: Diagnosis not present

## 2016-12-03 DIAGNOSIS — M86371 Chronic multifocal osteomyelitis, right ankle and foot: Secondary | ICD-10-CM | POA: Diagnosis not present

## 2016-12-03 DIAGNOSIS — L97522 Non-pressure chronic ulcer of other part of left foot with fat layer exposed: Secondary | ICD-10-CM | POA: Diagnosis not present

## 2016-12-03 DIAGNOSIS — N2581 Secondary hyperparathyroidism of renal origin: Secondary | ICD-10-CM | POA: Diagnosis not present

## 2016-12-03 DIAGNOSIS — E1121 Type 2 diabetes mellitus with diabetic nephropathy: Secondary | ICD-10-CM | POA: Diagnosis not present

## 2016-12-03 DIAGNOSIS — L97521 Non-pressure chronic ulcer of other part of left foot limited to breakdown of skin: Secondary | ICD-10-CM | POA: Diagnosis not present

## 2016-12-03 LAB — GLUCOSE, CAPILLARY
GLUCOSE-CAPILLARY: 127 mg/dL — AB (ref 65–99)
GLUCOSE-CAPILLARY: 144 mg/dL — AB (ref 65–99)
GLUCOSE-CAPILLARY: 154 mg/dL — AB (ref 65–99)

## 2016-12-06 DIAGNOSIS — E1121 Type 2 diabetes mellitus with diabetic nephropathy: Secondary | ICD-10-CM | POA: Diagnosis not present

## 2016-12-06 DIAGNOSIS — M86371 Chronic multifocal osteomyelitis, right ankle and foot: Secondary | ICD-10-CM | POA: Diagnosis not present

## 2016-12-06 DIAGNOSIS — L97521 Non-pressure chronic ulcer of other part of left foot limited to breakdown of skin: Secondary | ICD-10-CM | POA: Diagnosis not present

## 2016-12-06 DIAGNOSIS — N186 End stage renal disease: Secondary | ICD-10-CM | POA: Diagnosis not present

## 2016-12-06 DIAGNOSIS — N2581 Secondary hyperparathyroidism of renal origin: Secondary | ICD-10-CM | POA: Diagnosis not present

## 2016-12-06 DIAGNOSIS — L97511 Non-pressure chronic ulcer of other part of right foot limited to breakdown of skin: Secondary | ICD-10-CM | POA: Diagnosis not present

## 2016-12-06 DIAGNOSIS — E11621 Type 2 diabetes mellitus with foot ulcer: Secondary | ICD-10-CM | POA: Diagnosis not present

## 2016-12-06 DIAGNOSIS — L97522 Non-pressure chronic ulcer of other part of left foot with fat layer exposed: Secondary | ICD-10-CM | POA: Diagnosis not present

## 2016-12-06 DIAGNOSIS — D631 Anemia in chronic kidney disease: Secondary | ICD-10-CM | POA: Diagnosis not present

## 2016-12-06 DIAGNOSIS — L97512 Non-pressure chronic ulcer of other part of right foot with fat layer exposed: Secondary | ICD-10-CM | POA: Diagnosis not present

## 2016-12-06 DIAGNOSIS — S91309A Unspecified open wound, unspecified foot, initial encounter: Secondary | ICD-10-CM | POA: Diagnosis not present

## 2016-12-06 DIAGNOSIS — M8668 Other chronic osteomyelitis, other site: Secondary | ICD-10-CM | POA: Diagnosis not present

## 2016-12-06 LAB — GLUCOSE, CAPILLARY
Glucose-Capillary: 173 mg/dL — ABNORMAL HIGH (ref 65–99)
Glucose-Capillary: 126 mg/dL — ABNORMAL HIGH (ref 65–99)

## 2016-12-07 DIAGNOSIS — E11621 Type 2 diabetes mellitus with foot ulcer: Secondary | ICD-10-CM | POA: Diagnosis not present

## 2016-12-07 DIAGNOSIS — L97511 Non-pressure chronic ulcer of other part of right foot limited to breakdown of skin: Secondary | ICD-10-CM | POA: Diagnosis not present

## 2016-12-07 DIAGNOSIS — L97521 Non-pressure chronic ulcer of other part of left foot limited to breakdown of skin: Secondary | ICD-10-CM | POA: Diagnosis not present

## 2016-12-07 DIAGNOSIS — M86371 Chronic multifocal osteomyelitis, right ankle and foot: Secondary | ICD-10-CM | POA: Diagnosis not present

## 2016-12-07 DIAGNOSIS — M8668 Other chronic osteomyelitis, other site: Secondary | ICD-10-CM | POA: Diagnosis not present

## 2016-12-07 DIAGNOSIS — L97512 Non-pressure chronic ulcer of other part of right foot with fat layer exposed: Secondary | ICD-10-CM | POA: Diagnosis not present

## 2016-12-07 DIAGNOSIS — N185 Chronic kidney disease, stage 5: Secondary | ICD-10-CM | POA: Diagnosis not present

## 2016-12-07 DIAGNOSIS — L97522 Non-pressure chronic ulcer of other part of left foot with fat layer exposed: Secondary | ICD-10-CM | POA: Diagnosis not present

## 2016-12-07 LAB — GLUCOSE, CAPILLARY
GLUCOSE-CAPILLARY: 178 mg/dL — AB (ref 65–99)
GLUCOSE-CAPILLARY: 161 mg/dL — AB (ref 65–99)

## 2016-12-08 ENCOUNTER — Telehealth: Payer: Self-pay | Admitting: *Deleted

## 2016-12-08 DIAGNOSIS — E1121 Type 2 diabetes mellitus with diabetic nephropathy: Secondary | ICD-10-CM | POA: Diagnosis not present

## 2016-12-08 DIAGNOSIS — N2581 Secondary hyperparathyroidism of renal origin: Secondary | ICD-10-CM | POA: Diagnosis not present

## 2016-12-08 DIAGNOSIS — M86371 Chronic multifocal osteomyelitis, right ankle and foot: Secondary | ICD-10-CM | POA: Diagnosis not present

## 2016-12-08 DIAGNOSIS — L97521 Non-pressure chronic ulcer of other part of left foot limited to breakdown of skin: Secondary | ICD-10-CM | POA: Diagnosis not present

## 2016-12-08 DIAGNOSIS — L97511 Non-pressure chronic ulcer of other part of right foot limited to breakdown of skin: Secondary | ICD-10-CM | POA: Diagnosis not present

## 2016-12-08 DIAGNOSIS — S91309A Unspecified open wound, unspecified foot, initial encounter: Secondary | ICD-10-CM | POA: Diagnosis not present

## 2016-12-08 DIAGNOSIS — L97512 Non-pressure chronic ulcer of other part of right foot with fat layer exposed: Secondary | ICD-10-CM | POA: Diagnosis not present

## 2016-12-08 DIAGNOSIS — D631 Anemia in chronic kidney disease: Secondary | ICD-10-CM | POA: Diagnosis not present

## 2016-12-08 DIAGNOSIS — N186 End stage renal disease: Secondary | ICD-10-CM | POA: Diagnosis not present

## 2016-12-08 DIAGNOSIS — E11621 Type 2 diabetes mellitus with foot ulcer: Secondary | ICD-10-CM | POA: Diagnosis not present

## 2016-12-08 DIAGNOSIS — L97522 Non-pressure chronic ulcer of other part of left foot with fat layer exposed: Secondary | ICD-10-CM | POA: Diagnosis not present

## 2016-12-08 LAB — GLUCOSE, CAPILLARY: Glucose-Capillary: 121 mg/dL — ABNORMAL HIGH (ref 65–99)

## 2016-12-08 NOTE — Telephone Encounter (Signed)
Neta Mends RN at Dr Con Memos office left a message for Dr [name] that this patient he had a conversation with Dr Con Memos about is seen at the [facility] ph [phone] fax [phone]. She stated that IV antibiotics orders are to be called in for him there to be administered at dialysis appt. Will forward Dr [name] the information and left a message for Vaughan Basta that we received the call.

## 2016-12-09 ENCOUNTER — Telehealth: Payer: Self-pay | Admitting: Pharmacist

## 2016-12-09 DIAGNOSIS — L97511 Non-pressure chronic ulcer of other part of right foot limited to breakdown of skin: Secondary | ICD-10-CM | POA: Diagnosis not present

## 2016-12-09 DIAGNOSIS — M86371 Chronic multifocal osteomyelitis, right ankle and foot: Secondary | ICD-10-CM | POA: Diagnosis not present

## 2016-12-09 DIAGNOSIS — M8668 Other chronic osteomyelitis, other site: Secondary | ICD-10-CM | POA: Diagnosis not present

## 2016-12-09 DIAGNOSIS — L97522 Non-pressure chronic ulcer of other part of left foot with fat layer exposed: Secondary | ICD-10-CM | POA: Diagnosis not present

## 2016-12-09 DIAGNOSIS — L97521 Non-pressure chronic ulcer of other part of left foot limited to breakdown of skin: Secondary | ICD-10-CM | POA: Diagnosis not present

## 2016-12-09 DIAGNOSIS — L97512 Non-pressure chronic ulcer of other part of right foot with fat layer exposed: Secondary | ICD-10-CM | POA: Diagnosis not present

## 2016-12-09 DIAGNOSIS — E11621 Type 2 diabetes mellitus with foot ulcer: Secondary | ICD-10-CM | POA: Diagnosis not present

## 2016-12-09 LAB — GLUCOSE, CAPILLARY
GLUCOSE-CAPILLARY: 230 mg/dL — AB (ref 65–99)
Glucose-Capillary: 168 mg/dL — ABNORMAL HIGH (ref 65–99)

## 2016-12-09 NOTE — Telephone Encounter (Signed)
Called and spoke with RN at HD center.Hanley Seamen verbal orders to dose vanc and ceftaz until 01/05/17 when patient follows up with Dr. [name].  RN stated that the PA from other doctor's office already gave orders so they have already restarted abx.

## 2016-12-10 DIAGNOSIS — D631 Anemia in chronic kidney disease: Secondary | ICD-10-CM | POA: Diagnosis not present

## 2016-12-10 DIAGNOSIS — E1121 Type 2 diabetes mellitus with diabetic nephropathy: Secondary | ICD-10-CM | POA: Diagnosis not present

## 2016-12-10 DIAGNOSIS — L97511 Non-pressure chronic ulcer of other part of right foot limited to breakdown of skin: Secondary | ICD-10-CM | POA: Diagnosis not present

## 2016-12-10 DIAGNOSIS — N186 End stage renal disease: Secondary | ICD-10-CM | POA: Diagnosis not present

## 2016-12-10 DIAGNOSIS — M8668 Other chronic osteomyelitis, other site: Secondary | ICD-10-CM | POA: Diagnosis not present

## 2016-12-10 DIAGNOSIS — L97522 Non-pressure chronic ulcer of other part of left foot with fat layer exposed: Secondary | ICD-10-CM | POA: Diagnosis not present

## 2016-12-10 DIAGNOSIS — L97512 Non-pressure chronic ulcer of other part of right foot with fat layer exposed: Secondary | ICD-10-CM | POA: Diagnosis not present

## 2016-12-10 DIAGNOSIS — N2581 Secondary hyperparathyroidism of renal origin: Secondary | ICD-10-CM | POA: Diagnosis not present

## 2016-12-10 DIAGNOSIS — L97521 Non-pressure chronic ulcer of other part of left foot limited to breakdown of skin: Secondary | ICD-10-CM | POA: Diagnosis not present

## 2016-12-10 DIAGNOSIS — S91309A Unspecified open wound, unspecified foot, initial encounter: Secondary | ICD-10-CM | POA: Diagnosis not present

## 2016-12-10 DIAGNOSIS — E11621 Type 2 diabetes mellitus with foot ulcer: Secondary | ICD-10-CM | POA: Diagnosis not present

## 2016-12-10 DIAGNOSIS — M86371 Chronic multifocal osteomyelitis, right ankle and foot: Secondary | ICD-10-CM | POA: Diagnosis not present

## 2016-12-10 LAB — GLUCOSE, CAPILLARY
GLUCOSE-CAPILLARY: 180 mg/dL — AB (ref 65–99)
Glucose-Capillary: 127 mg/dL — ABNORMAL HIGH (ref 65–99)
Glucose-Capillary: 203 mg/dL — ABNORMAL HIGH (ref 65–99)

## 2016-12-13 DIAGNOSIS — E11621 Type 2 diabetes mellitus with foot ulcer: Secondary | ICD-10-CM | POA: Diagnosis not present

## 2016-12-13 DIAGNOSIS — L97522 Non-pressure chronic ulcer of other part of left foot with fat layer exposed: Secondary | ICD-10-CM | POA: Diagnosis not present

## 2016-12-13 DIAGNOSIS — M86371 Chronic multifocal osteomyelitis, right ankle and foot: Secondary | ICD-10-CM | POA: Diagnosis not present

## 2016-12-13 DIAGNOSIS — D631 Anemia in chronic kidney disease: Secondary | ICD-10-CM | POA: Diagnosis not present

## 2016-12-13 DIAGNOSIS — L97512 Non-pressure chronic ulcer of other part of right foot with fat layer exposed: Secondary | ICD-10-CM | POA: Diagnosis not present

## 2016-12-13 DIAGNOSIS — L97521 Non-pressure chronic ulcer of other part of left foot limited to breakdown of skin: Secondary | ICD-10-CM | POA: Diagnosis not present

## 2016-12-13 DIAGNOSIS — E1121 Type 2 diabetes mellitus with diabetic nephropathy: Secondary | ICD-10-CM | POA: Diagnosis not present

## 2016-12-13 DIAGNOSIS — L97511 Non-pressure chronic ulcer of other part of right foot limited to breakdown of skin: Secondary | ICD-10-CM | POA: Diagnosis not present

## 2016-12-13 DIAGNOSIS — N2581 Secondary hyperparathyroidism of renal origin: Secondary | ICD-10-CM | POA: Diagnosis not present

## 2016-12-13 DIAGNOSIS — M8668 Other chronic osteomyelitis, other site: Secondary | ICD-10-CM | POA: Diagnosis not present

## 2016-12-13 DIAGNOSIS — N186 End stage renal disease: Secondary | ICD-10-CM | POA: Diagnosis not present

## 2016-12-13 DIAGNOSIS — S91309A Unspecified open wound, unspecified foot, initial encounter: Secondary | ICD-10-CM | POA: Diagnosis not present

## 2016-12-13 LAB — GLUCOSE, CAPILLARY
Glucose-Capillary: 191 mg/dL — ABNORMAL HIGH (ref 65–99)
Glucose-Capillary: 136 mg/dL — ABNORMAL HIGH (ref 65–99)

## 2016-12-14 DIAGNOSIS — L97521 Non-pressure chronic ulcer of other part of left foot limited to breakdown of skin: Secondary | ICD-10-CM | POA: Diagnosis not present

## 2016-12-14 DIAGNOSIS — Z992 Dependence on renal dialysis: Secondary | ICD-10-CM | POA: Diagnosis not present

## 2016-12-14 DIAGNOSIS — N185 Chronic kidney disease, stage 5: Secondary | ICD-10-CM | POA: Diagnosis not present

## 2016-12-14 DIAGNOSIS — L97526 Non-pressure chronic ulcer of other part of left foot with bone involvement without evidence of necrosis: Secondary | ICD-10-CM | POA: Diagnosis not present

## 2016-12-14 DIAGNOSIS — M86371 Chronic multifocal osteomyelitis, right ankle and foot: Secondary | ICD-10-CM | POA: Diagnosis not present

## 2016-12-14 DIAGNOSIS — L97516 Non-pressure chronic ulcer of other part of right foot with bone involvement without evidence of necrosis: Secondary | ICD-10-CM | POA: Diagnosis not present

## 2016-12-14 DIAGNOSIS — L97522 Non-pressure chronic ulcer of other part of left foot with fat layer exposed: Secondary | ICD-10-CM | POA: Diagnosis not present

## 2016-12-14 DIAGNOSIS — L97511 Non-pressure chronic ulcer of other part of right foot limited to breakdown of skin: Secondary | ICD-10-CM | POA: Diagnosis not present

## 2016-12-14 DIAGNOSIS — M8668 Other chronic osteomyelitis, other site: Secondary | ICD-10-CM | POA: Diagnosis not present

## 2016-12-14 DIAGNOSIS — E11621 Type 2 diabetes mellitus with foot ulcer: Secondary | ICD-10-CM | POA: Diagnosis not present

## 2016-12-14 DIAGNOSIS — L97512 Non-pressure chronic ulcer of other part of right foot with fat layer exposed: Secondary | ICD-10-CM | POA: Diagnosis not present

## 2016-12-14 LAB — GLUCOSE, CAPILLARY
Glucose-Capillary: 173 mg/dL — ABNORMAL HIGH (ref 65–99)
Glucose-Capillary: 150 mg/dL — ABNORMAL HIGH (ref 65–99)

## 2016-12-15 DIAGNOSIS — E11621 Type 2 diabetes mellitus with foot ulcer: Secondary | ICD-10-CM | POA: Diagnosis not present

## 2016-12-15 DIAGNOSIS — M8668 Other chronic osteomyelitis, other site: Secondary | ICD-10-CM | POA: Diagnosis not present

## 2016-12-15 DIAGNOSIS — S91309A Unspecified open wound, unspecified foot, initial encounter: Secondary | ICD-10-CM | POA: Diagnosis not present

## 2016-12-15 DIAGNOSIS — L97511 Non-pressure chronic ulcer of other part of right foot limited to breakdown of skin: Secondary | ICD-10-CM | POA: Diagnosis not present

## 2016-12-15 DIAGNOSIS — L97521 Non-pressure chronic ulcer of other part of left foot limited to breakdown of skin: Secondary | ICD-10-CM | POA: Diagnosis not present

## 2016-12-15 DIAGNOSIS — N2581 Secondary hyperparathyroidism of renal origin: Secondary | ICD-10-CM | POA: Diagnosis not present

## 2016-12-15 DIAGNOSIS — L97512 Non-pressure chronic ulcer of other part of right foot with fat layer exposed: Secondary | ICD-10-CM | POA: Diagnosis not present

## 2016-12-15 DIAGNOSIS — D631 Anemia in chronic kidney disease: Secondary | ICD-10-CM | POA: Diagnosis not present

## 2016-12-15 DIAGNOSIS — L97522 Non-pressure chronic ulcer of other part of left foot with fat layer exposed: Secondary | ICD-10-CM | POA: Diagnosis not present

## 2016-12-15 DIAGNOSIS — N186 End stage renal disease: Secondary | ICD-10-CM | POA: Diagnosis not present

## 2016-12-15 DIAGNOSIS — M86371 Chronic multifocal osteomyelitis, right ankle and foot: Secondary | ICD-10-CM | POA: Diagnosis not present

## 2016-12-15 DIAGNOSIS — E1121 Type 2 diabetes mellitus with diabetic nephropathy: Secondary | ICD-10-CM | POA: Diagnosis not present

## 2016-12-15 LAB — GLUCOSE, CAPILLARY
GLUCOSE-CAPILLARY: 157 mg/dL — AB (ref 65–99)
GLUCOSE-CAPILLARY: 186 mg/dL — AB (ref 65–99)

## 2016-12-16 DIAGNOSIS — L97522 Non-pressure chronic ulcer of other part of left foot with fat layer exposed: Secondary | ICD-10-CM | POA: Diagnosis not present

## 2016-12-16 DIAGNOSIS — L97511 Non-pressure chronic ulcer of other part of right foot limited to breakdown of skin: Secondary | ICD-10-CM | POA: Diagnosis not present

## 2016-12-16 DIAGNOSIS — L97512 Non-pressure chronic ulcer of other part of right foot with fat layer exposed: Secondary | ICD-10-CM | POA: Diagnosis not present

## 2016-12-16 DIAGNOSIS — E11621 Type 2 diabetes mellitus with foot ulcer: Secondary | ICD-10-CM | POA: Diagnosis not present

## 2016-12-16 DIAGNOSIS — L97521 Non-pressure chronic ulcer of other part of left foot limited to breakdown of skin: Secondary | ICD-10-CM | POA: Diagnosis not present

## 2016-12-16 DIAGNOSIS — M8668 Other chronic osteomyelitis, other site: Secondary | ICD-10-CM | POA: Diagnosis not present

## 2016-12-16 DIAGNOSIS — M86371 Chronic multifocal osteomyelitis, right ankle and foot: Secondary | ICD-10-CM | POA: Diagnosis not present

## 2016-12-16 LAB — GLUCOSE, CAPILLARY
GLUCOSE-CAPILLARY: 152 mg/dL — AB (ref 65–99)
Glucose-Capillary: 106 mg/dL — ABNORMAL HIGH (ref 65–99)

## 2016-12-17 DIAGNOSIS — N186 End stage renal disease: Secondary | ICD-10-CM | POA: Diagnosis not present

## 2016-12-17 DIAGNOSIS — L97521 Non-pressure chronic ulcer of other part of left foot limited to breakdown of skin: Secondary | ICD-10-CM | POA: Diagnosis not present

## 2016-12-17 DIAGNOSIS — N2581 Secondary hyperparathyroidism of renal origin: Secondary | ICD-10-CM | POA: Diagnosis not present

## 2016-12-17 DIAGNOSIS — L97512 Non-pressure chronic ulcer of other part of right foot with fat layer exposed: Secondary | ICD-10-CM | POA: Diagnosis not present

## 2016-12-17 DIAGNOSIS — S91309A Unspecified open wound, unspecified foot, initial encounter: Secondary | ICD-10-CM | POA: Diagnosis not present

## 2016-12-17 DIAGNOSIS — D631 Anemia in chronic kidney disease: Secondary | ICD-10-CM | POA: Diagnosis not present

## 2016-12-17 DIAGNOSIS — L97511 Non-pressure chronic ulcer of other part of right foot limited to breakdown of skin: Secondary | ICD-10-CM | POA: Diagnosis not present

## 2016-12-17 DIAGNOSIS — E1121 Type 2 diabetes mellitus with diabetic nephropathy: Secondary | ICD-10-CM | POA: Diagnosis not present

## 2016-12-17 DIAGNOSIS — E11621 Type 2 diabetes mellitus with foot ulcer: Secondary | ICD-10-CM | POA: Diagnosis not present

## 2016-12-17 DIAGNOSIS — M8668 Other chronic osteomyelitis, other site: Secondary | ICD-10-CM | POA: Diagnosis not present

## 2016-12-17 DIAGNOSIS — L97522 Non-pressure chronic ulcer of other part of left foot with fat layer exposed: Secondary | ICD-10-CM | POA: Diagnosis not present

## 2016-12-17 DIAGNOSIS — M86371 Chronic multifocal osteomyelitis, right ankle and foot: Secondary | ICD-10-CM | POA: Diagnosis not present

## 2016-12-17 LAB — GLUCOSE, CAPILLARY
GLUCOSE-CAPILLARY: 201 mg/dL — AB (ref 65–99)
Glucose-Capillary: 193 mg/dL — ABNORMAL HIGH (ref 65–99)

## 2016-12-19 DIAGNOSIS — N2581 Secondary hyperparathyroidism of renal origin: Secondary | ICD-10-CM | POA: Diagnosis not present

## 2016-12-19 DIAGNOSIS — N186 End stage renal disease: Secondary | ICD-10-CM | POA: Diagnosis not present

## 2016-12-19 DIAGNOSIS — D631 Anemia in chronic kidney disease: Secondary | ICD-10-CM | POA: Diagnosis not present

## 2016-12-19 DIAGNOSIS — E1121 Type 2 diabetes mellitus with diabetic nephropathy: Secondary | ICD-10-CM | POA: Diagnosis not present

## 2016-12-19 DIAGNOSIS — S91309A Unspecified open wound, unspecified foot, initial encounter: Secondary | ICD-10-CM | POA: Diagnosis not present

## 2016-12-22 DIAGNOSIS — L97511 Non-pressure chronic ulcer of other part of right foot limited to breakdown of skin: Secondary | ICD-10-CM | POA: Diagnosis not present

## 2016-12-22 DIAGNOSIS — L97512 Non-pressure chronic ulcer of other part of right foot with fat layer exposed: Secondary | ICD-10-CM | POA: Diagnosis not present

## 2016-12-22 DIAGNOSIS — E11621 Type 2 diabetes mellitus with foot ulcer: Secondary | ICD-10-CM | POA: Diagnosis not present

## 2016-12-22 DIAGNOSIS — L97522 Non-pressure chronic ulcer of other part of left foot with fat layer exposed: Secondary | ICD-10-CM | POA: Diagnosis not present

## 2016-12-22 DIAGNOSIS — N186 End stage renal disease: Secondary | ICD-10-CM | POA: Diagnosis not present

## 2016-12-22 DIAGNOSIS — E1121 Type 2 diabetes mellitus with diabetic nephropathy: Secondary | ICD-10-CM | POA: Diagnosis not present

## 2016-12-22 DIAGNOSIS — S91309A Unspecified open wound, unspecified foot, initial encounter: Secondary | ICD-10-CM | POA: Diagnosis not present

## 2016-12-22 DIAGNOSIS — N2581 Secondary hyperparathyroidism of renal origin: Secondary | ICD-10-CM | POA: Diagnosis not present

## 2016-12-22 DIAGNOSIS — D631 Anemia in chronic kidney disease: Secondary | ICD-10-CM | POA: Diagnosis not present

## 2016-12-22 DIAGNOSIS — M8668 Other chronic osteomyelitis, other site: Secondary | ICD-10-CM | POA: Diagnosis not present

## 2016-12-22 DIAGNOSIS — M86371 Chronic multifocal osteomyelitis, right ankle and foot: Secondary | ICD-10-CM | POA: Diagnosis not present

## 2016-12-22 DIAGNOSIS — L97521 Non-pressure chronic ulcer of other part of left foot limited to breakdown of skin: Secondary | ICD-10-CM | POA: Diagnosis not present

## 2016-12-22 LAB — GLUCOSE, CAPILLARY
Glucose-Capillary: 162 mg/dL — ABNORMAL HIGH (ref 65–99)
Glucose-Capillary: 150 mg/dL — ABNORMAL HIGH (ref 65–99)

## 2016-12-23 DIAGNOSIS — M8668 Other chronic osteomyelitis, other site: Secondary | ICD-10-CM | POA: Diagnosis not present

## 2016-12-23 DIAGNOSIS — M86371 Chronic multifocal osteomyelitis, right ankle and foot: Secondary | ICD-10-CM | POA: Diagnosis not present

## 2016-12-23 DIAGNOSIS — L97511 Non-pressure chronic ulcer of other part of right foot limited to breakdown of skin: Secondary | ICD-10-CM | POA: Diagnosis not present

## 2016-12-23 DIAGNOSIS — L97512 Non-pressure chronic ulcer of other part of right foot with fat layer exposed: Secondary | ICD-10-CM | POA: Diagnosis not present

## 2016-12-23 DIAGNOSIS — E11621 Type 2 diabetes mellitus with foot ulcer: Secondary | ICD-10-CM | POA: Diagnosis not present

## 2016-12-23 DIAGNOSIS — L97522 Non-pressure chronic ulcer of other part of left foot with fat layer exposed: Secondary | ICD-10-CM | POA: Diagnosis not present

## 2016-12-23 DIAGNOSIS — L97516 Non-pressure chronic ulcer of other part of right foot with bone involvement without evidence of necrosis: Secondary | ICD-10-CM | POA: Diagnosis not present

## 2016-12-23 DIAGNOSIS — L97521 Non-pressure chronic ulcer of other part of left foot limited to breakdown of skin: Secondary | ICD-10-CM | POA: Diagnosis not present

## 2016-12-23 LAB — GLUCOSE, CAPILLARY
GLUCOSE-CAPILLARY: 219 mg/dL — AB (ref 65–99)
Glucose-Capillary: 91 mg/dL (ref 65–99)
Glucose-Capillary: 211 mg/dL — ABNORMAL HIGH (ref 65–99)

## 2016-12-24 DIAGNOSIS — E1121 Type 2 diabetes mellitus with diabetic nephropathy: Secondary | ICD-10-CM | POA: Diagnosis not present

## 2016-12-24 DIAGNOSIS — D631 Anemia in chronic kidney disease: Secondary | ICD-10-CM | POA: Diagnosis not present

## 2016-12-24 DIAGNOSIS — N2581 Secondary hyperparathyroidism of renal origin: Secondary | ICD-10-CM | POA: Diagnosis not present

## 2016-12-24 DIAGNOSIS — N186 End stage renal disease: Secondary | ICD-10-CM | POA: Diagnosis not present

## 2016-12-24 DIAGNOSIS — S91309A Unspecified open wound, unspecified foot, initial encounter: Secondary | ICD-10-CM | POA: Diagnosis not present

## 2016-12-26 DIAGNOSIS — E1122 Type 2 diabetes mellitus with diabetic chronic kidney disease: Secondary | ICD-10-CM | POA: Diagnosis not present

## 2016-12-26 DIAGNOSIS — Z992 Dependence on renal dialysis: Secondary | ICD-10-CM | POA: Diagnosis not present

## 2016-12-26 DIAGNOSIS — S91309A Unspecified open wound, unspecified foot, initial encounter: Secondary | ICD-10-CM | POA: Diagnosis not present

## 2016-12-26 DIAGNOSIS — N2581 Secondary hyperparathyroidism of renal origin: Secondary | ICD-10-CM | POA: Diagnosis not present

## 2016-12-26 DIAGNOSIS — D631 Anemia in chronic kidney disease: Secondary | ICD-10-CM | POA: Diagnosis not present

## 2016-12-26 DIAGNOSIS — N186 End stage renal disease: Secondary | ICD-10-CM | POA: Diagnosis not present

## 2016-12-26 DIAGNOSIS — E1121 Type 2 diabetes mellitus with diabetic nephropathy: Secondary | ICD-10-CM | POA: Diagnosis not present

## 2016-12-28 ENCOUNTER — Encounter (HOSPITAL_BASED_OUTPATIENT_CLINIC_OR_DEPARTMENT_OTHER): Payer: Medicare Other | Attending: Surgery

## 2016-12-28 DIAGNOSIS — E11621 Type 2 diabetes mellitus with foot ulcer: Secondary | ICD-10-CM | POA: Insufficient documentation

## 2016-12-28 DIAGNOSIS — E1122 Type 2 diabetes mellitus with diabetic chronic kidney disease: Secondary | ICD-10-CM | POA: Diagnosis not present

## 2016-12-28 DIAGNOSIS — M86371 Chronic multifocal osteomyelitis, right ankle and foot: Secondary | ICD-10-CM | POA: Diagnosis not present

## 2016-12-28 DIAGNOSIS — E1140 Type 2 diabetes mellitus with diabetic neuropathy, unspecified: Secondary | ICD-10-CM | POA: Insufficient documentation

## 2016-12-28 DIAGNOSIS — I1310 Hypertensive heart and chronic kidney disease without heart failure, with stage 1 through stage 4 chronic kidney disease, or unspecified chronic kidney disease: Secondary | ICD-10-CM | POA: Diagnosis not present

## 2016-12-28 DIAGNOSIS — L97522 Non-pressure chronic ulcer of other part of left foot with fat layer exposed: Secondary | ICD-10-CM | POA: Diagnosis not present

## 2016-12-28 DIAGNOSIS — Z992 Dependence on renal dialysis: Secondary | ICD-10-CM | POA: Insufficient documentation

## 2016-12-28 DIAGNOSIS — Z87891 Personal history of nicotine dependence: Secondary | ICD-10-CM | POA: Insufficient documentation

## 2016-12-28 DIAGNOSIS — L97512 Non-pressure chronic ulcer of other part of right foot with fat layer exposed: Secondary | ICD-10-CM | POA: Diagnosis not present

## 2016-12-28 DIAGNOSIS — Z89421 Acquired absence of other right toe(s): Secondary | ICD-10-CM | POA: Diagnosis not present

## 2016-12-28 DIAGNOSIS — E1169 Type 2 diabetes mellitus with other specified complication: Secondary | ICD-10-CM | POA: Diagnosis not present

## 2016-12-28 DIAGNOSIS — L97422 Non-pressure chronic ulcer of left heel and midfoot with fat layer exposed: Secondary | ICD-10-CM | POA: Insufficient documentation

## 2016-12-28 DIAGNOSIS — N186 End stage renal disease: Secondary | ICD-10-CM | POA: Insufficient documentation

## 2016-12-28 DIAGNOSIS — L97516 Non-pressure chronic ulcer of other part of right foot with bone involvement without evidence of necrosis: Secondary | ICD-10-CM | POA: Diagnosis not present

## 2016-12-28 DIAGNOSIS — N185 Chronic kidney disease, stage 5: Secondary | ICD-10-CM | POA: Diagnosis not present

## 2016-12-28 DIAGNOSIS — M8668 Other chronic osteomyelitis, other site: Secondary | ICD-10-CM | POA: Diagnosis not present

## 2016-12-28 LAB — GLUCOSE, CAPILLARY
Glucose-Capillary: 168 mg/dL — ABNORMAL HIGH (ref 65–99)
Glucose-Capillary: 149 mg/dL — ABNORMAL HIGH (ref 65–99)

## 2016-12-29 DIAGNOSIS — E1169 Type 2 diabetes mellitus with other specified complication: Secondary | ICD-10-CM | POA: Diagnosis not present

## 2016-12-29 DIAGNOSIS — M8668 Other chronic osteomyelitis, other site: Secondary | ICD-10-CM | POA: Diagnosis not present

## 2016-12-29 DIAGNOSIS — N186 End stage renal disease: Secondary | ICD-10-CM | POA: Diagnosis not present

## 2016-12-29 DIAGNOSIS — L97522 Non-pressure chronic ulcer of other part of left foot with fat layer exposed: Secondary | ICD-10-CM | POA: Diagnosis not present

## 2016-12-29 DIAGNOSIS — E1121 Type 2 diabetes mellitus with diabetic nephropathy: Secondary | ICD-10-CM | POA: Diagnosis not present

## 2016-12-29 DIAGNOSIS — L97516 Non-pressure chronic ulcer of other part of right foot with bone involvement without evidence of necrosis: Secondary | ICD-10-CM | POA: Diagnosis not present

## 2016-12-29 DIAGNOSIS — E11621 Type 2 diabetes mellitus with foot ulcer: Secondary | ICD-10-CM | POA: Diagnosis not present

## 2016-12-29 DIAGNOSIS — L97422 Non-pressure chronic ulcer of left heel and midfoot with fat layer exposed: Secondary | ICD-10-CM | POA: Diagnosis not present

## 2016-12-29 DIAGNOSIS — Z992 Dependence on renal dialysis: Secondary | ICD-10-CM | POA: Diagnosis not present

## 2016-12-29 DIAGNOSIS — N2581 Secondary hyperparathyroidism of renal origin: Secondary | ICD-10-CM | POA: Diagnosis not present

## 2016-12-29 DIAGNOSIS — S91309A Unspecified open wound, unspecified foot, initial encounter: Secondary | ICD-10-CM | POA: Diagnosis not present

## 2016-12-29 DIAGNOSIS — M86371 Chronic multifocal osteomyelitis, right ankle and foot: Secondary | ICD-10-CM | POA: Diagnosis not present

## 2016-12-29 DIAGNOSIS — L97512 Non-pressure chronic ulcer of other part of right foot with fat layer exposed: Secondary | ICD-10-CM | POA: Diagnosis not present

## 2016-12-29 LAB — GLUCOSE, CAPILLARY
GLUCOSE-CAPILLARY: 181 mg/dL — AB (ref 65–99)
Glucose-Capillary: 179 mg/dL — ABNORMAL HIGH (ref 65–99)

## 2016-12-30 DIAGNOSIS — Z992 Dependence on renal dialysis: Secondary | ICD-10-CM | POA: Diagnosis not present

## 2016-12-30 DIAGNOSIS — E1169 Type 2 diabetes mellitus with other specified complication: Secondary | ICD-10-CM | POA: Diagnosis not present

## 2016-12-30 DIAGNOSIS — M8668 Other chronic osteomyelitis, other site: Secondary | ICD-10-CM | POA: Diagnosis not present

## 2016-12-30 DIAGNOSIS — L97522 Non-pressure chronic ulcer of other part of left foot with fat layer exposed: Secondary | ICD-10-CM | POA: Diagnosis not present

## 2016-12-30 DIAGNOSIS — L97422 Non-pressure chronic ulcer of left heel and midfoot with fat layer exposed: Secondary | ICD-10-CM | POA: Diagnosis not present

## 2016-12-30 DIAGNOSIS — L97516 Non-pressure chronic ulcer of other part of right foot with bone involvement without evidence of necrosis: Secondary | ICD-10-CM | POA: Diagnosis not present

## 2016-12-30 DIAGNOSIS — L97512 Non-pressure chronic ulcer of other part of right foot with fat layer exposed: Secondary | ICD-10-CM | POA: Diagnosis not present

## 2016-12-30 DIAGNOSIS — M86371 Chronic multifocal osteomyelitis, right ankle and foot: Secondary | ICD-10-CM | POA: Diagnosis not present

## 2016-12-30 DIAGNOSIS — E11621 Type 2 diabetes mellitus with foot ulcer: Secondary | ICD-10-CM | POA: Diagnosis not present

## 2016-12-30 LAB — GLUCOSE, CAPILLARY
GLUCOSE-CAPILLARY: 152 mg/dL — AB (ref 65–99)
Glucose-Capillary: 145 mg/dL — ABNORMAL HIGH (ref 65–99)

## 2016-12-31 DIAGNOSIS — N2581 Secondary hyperparathyroidism of renal origin: Secondary | ICD-10-CM | POA: Diagnosis not present

## 2016-12-31 DIAGNOSIS — S91309A Unspecified open wound, unspecified foot, initial encounter: Secondary | ICD-10-CM | POA: Diagnosis not present

## 2016-12-31 DIAGNOSIS — E1121 Type 2 diabetes mellitus with diabetic nephropathy: Secondary | ICD-10-CM | POA: Diagnosis not present

## 2016-12-31 DIAGNOSIS — M8668 Other chronic osteomyelitis, other site: Secondary | ICD-10-CM | POA: Diagnosis not present

## 2016-12-31 DIAGNOSIS — N186 End stage renal disease: Secondary | ICD-10-CM | POA: Diagnosis not present

## 2016-12-31 DIAGNOSIS — M86371 Chronic multifocal osteomyelitis, right ankle and foot: Secondary | ICD-10-CM | POA: Diagnosis not present

## 2016-12-31 DIAGNOSIS — E1169 Type 2 diabetes mellitus with other specified complication: Secondary | ICD-10-CM | POA: Diagnosis not present

## 2016-12-31 DIAGNOSIS — L97422 Non-pressure chronic ulcer of left heel and midfoot with fat layer exposed: Secondary | ICD-10-CM | POA: Diagnosis not present

## 2016-12-31 DIAGNOSIS — L97516 Non-pressure chronic ulcer of other part of right foot with bone involvement without evidence of necrosis: Secondary | ICD-10-CM | POA: Diagnosis not present

## 2016-12-31 DIAGNOSIS — E11621 Type 2 diabetes mellitus with foot ulcer: Secondary | ICD-10-CM | POA: Diagnosis not present

## 2016-12-31 DIAGNOSIS — Z992 Dependence on renal dialysis: Secondary | ICD-10-CM | POA: Diagnosis not present

## 2016-12-31 LAB — GLUCOSE, CAPILLARY
GLUCOSE-CAPILLARY: 220 mg/dL — AB (ref 65–99)
Glucose-Capillary: 204 mg/dL — ABNORMAL HIGH (ref 65–99)

## 2017-01-03 DIAGNOSIS — M79671 Pain in right foot: Secondary | ICD-10-CM | POA: Diagnosis not present

## 2017-01-03 DIAGNOSIS — N2581 Secondary hyperparathyroidism of renal origin: Secondary | ICD-10-CM | POA: Diagnosis not present

## 2017-01-03 DIAGNOSIS — S91309A Unspecified open wound, unspecified foot, initial encounter: Secondary | ICD-10-CM | POA: Diagnosis not present

## 2017-01-03 DIAGNOSIS — N186 End stage renal disease: Secondary | ICD-10-CM | POA: Diagnosis not present

## 2017-01-03 DIAGNOSIS — E1121 Type 2 diabetes mellitus with diabetic nephropathy: Secondary | ICD-10-CM | POA: Diagnosis not present

## 2017-01-04 ENCOUNTER — Other Ambulatory Visit: Payer: Self-pay | Admitting: Surgery

## 2017-01-04 ENCOUNTER — Ambulatory Visit (HOSPITAL_COMMUNITY)
Admission: RE | Admit: 2017-01-04 | Discharge: 2017-01-04 | Disposition: A | Discharge status: Home / Self Care | Payer: Medicare Other | Source: Ambulatory Visit | Attending: Surgery | Admitting: Surgery

## 2017-01-04 DIAGNOSIS — Z992 Dependence on renal dialysis: Secondary | ICD-10-CM | POA: Diagnosis not present

## 2017-01-04 DIAGNOSIS — L97529 Non-pressure chronic ulcer of other part of left foot with unspecified severity: Secondary | ICD-10-CM | POA: Insufficient documentation

## 2017-01-04 DIAGNOSIS — E11621 Type 2 diabetes mellitus with foot ulcer: Secondary | ICD-10-CM | POA: Insufficient documentation

## 2017-01-04 DIAGNOSIS — L97522 Non-pressure chronic ulcer of other part of left foot with fat layer exposed: Secondary | ICD-10-CM

## 2017-01-04 DIAGNOSIS — L97422 Non-pressure chronic ulcer of left heel and midfoot with fat layer exposed: Secondary | ICD-10-CM | POA: Diagnosis not present

## 2017-01-04 DIAGNOSIS — M8668 Other chronic osteomyelitis, other site: Secondary | ICD-10-CM | POA: Diagnosis not present

## 2017-01-04 DIAGNOSIS — L97512 Non-pressure chronic ulcer of other part of right foot with fat layer exposed: Secondary | ICD-10-CM | POA: Diagnosis not present

## 2017-01-04 DIAGNOSIS — L97429 Non-pressure chronic ulcer of left heel and midfoot with unspecified severity: Secondary | ICD-10-CM | POA: Diagnosis not present

## 2017-01-04 DIAGNOSIS — E1169 Type 2 diabetes mellitus with other specified complication: Secondary | ICD-10-CM | POA: Diagnosis not present

## 2017-01-04 DIAGNOSIS — M86371 Chronic multifocal osteomyelitis, right ankle and foot: Secondary | ICD-10-CM | POA: Diagnosis not present

## 2017-01-04 DIAGNOSIS — L97516 Non-pressure chronic ulcer of other part of right foot with bone involvement without evidence of necrosis: Secondary | ICD-10-CM | POA: Diagnosis not present

## 2017-01-04 LAB — GLUCOSE, CAPILLARY
Glucose-Capillary: 175 mg/dL — ABNORMAL HIGH (ref 65–99)
Glucose-Capillary: 182 mg/dL — ABNORMAL HIGH (ref 65–99)

## 2017-01-05 ENCOUNTER — Encounter: Payer: Self-pay | Admitting: Infectious Diseases

## 2017-01-05 ENCOUNTER — Ambulatory Visit (INDEPENDENT_AMBULATORY_CARE_PROVIDER_SITE_OTHER): Payer: Medicare Other | Admitting: Infectious Diseases

## 2017-01-05 DIAGNOSIS — Z794 Long term (current) use of insulin: Secondary | ICD-10-CM | POA: Diagnosis not present

## 2017-01-05 DIAGNOSIS — N186 End stage renal disease: Secondary | ICD-10-CM | POA: Diagnosis not present

## 2017-01-05 DIAGNOSIS — E1121 Type 2 diabetes mellitus with diabetic nephropathy: Secondary | ICD-10-CM | POA: Diagnosis not present

## 2017-01-05 DIAGNOSIS — N2581 Secondary hyperparathyroidism of renal origin: Secondary | ICD-10-CM | POA: Diagnosis not present

## 2017-01-05 DIAGNOSIS — M86371 Chronic multifocal osteomyelitis, right ankle and foot: Secondary | ICD-10-CM

## 2017-01-05 DIAGNOSIS — S91309A Unspecified open wound, unspecified foot, initial encounter: Secondary | ICD-10-CM | POA: Diagnosis not present

## 2017-01-05 NOTE — Assessment & Plan Note (Addendum)
I am vey concerned about his R foot wounds. The ulcer on his R 2nd toe is quite deep and his 3rd toe has a thickened callus that would easily remove the distal 1/3 of his toe if this fell off.  Worsening the situation is that he has been on anbx since 12-15 without improvement.  I will have him eval by vascular surgeon as these seems to be the best hope for improvement of his wound healing.  He will call us back with the name of his previous vascular surgeon.  I confirmed his anbx with [facility]. [phone]) I believe he will need further surgery.  I would not stop his anbx til he f/u with me.

## 2017-01-05 NOTE — Progress Notes (Signed)
   Subjective:    Patient ID: [name] Sages Sr., male    DOB: Mar 18, 1951, 66 y.o.   MRN: [id number]  HPI 66 yo M with hx of DM2, ESRD, CAD, and ulcers on R first metatarsal head; L 5th and L 1st MT heads. He has had local debridement of callus.  He has previously had R 5th ray-amp.  In March he received a course of vanco/ceftaz at HD for ~ 1 month. He was then transitioned to po doxy.  In JUly 2017 he recieved Vancomycin due to increasing drainage from his wound. Cx of this was polymicrobial.  He was in hospital 08-2016 and was treated with 6 weeks of vanc/oceftaz for his foot wounds. He was rec to have vascular and surgical evals at that time however pt wished to go home.  He states these anbx healed his wounds.  He has been followed at [facility] and has been getting hyperbaric therapy.  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  His last MRI showed osteo of R foot (Sept 2017).   He saw ortho on 01-03-17 and was recommended to have R 2nd toe amputation. He also has non-healing L foot ulcer.  Today he has had hardening of his skin. Nails have fallen off. His wounds have been packed at [facility]  He called ID in December was and was restarted on vanco/ceftaz (12-10-16).  Plain film L foot 01-04-17 was (-).  States his sugars have been irregular, can't have hyperbaric O2 if his FSG < 130.  Non-smoked.  Believes he had vascular eval > 1 yr ago.   The past medical history, family history and social history were reviewed/updated in EPIC  Review of Systems  Constitutional: Negative for appetite change, chills, fever and unexpected weight change.  Gastrointestinal: Negative for constipation and diarrhea.  Skin: Positive for wound.   No problems with fistula.     Objective:   Physical Exam  Constitutional: He appears well-developed and well-nourished.  HENT:  Mouth/Throat: No  oropharyngeal exudate.  Eyes: EOM are normal. Pupils are equal, round, and reactive to light.  Neck: Neck supple.  Cardiovascular: Normal rate, regular rhythm and normal heart sounds.   Pulmonary/Chest: Effort normal and breath sounds normal.  Abdominal: Soft. Bowel sounds are normal. There is no tenderness. There is no rebound.  Musculoskeletal:       Arms:      Feet:  Lymphadenopathy:    He has no cervical adenopathy.   His wounds are non-tender.   Please see images.       Assessment & Plan:

## 2017-01-05 NOTE — Assessment & Plan Note (Signed)
Some difficulty with control, pt ascribes to HBO.

## 2017-01-06 ENCOUNTER — Telehealth: Payer: Self-pay | Admitting: *Deleted

## 2017-01-06 DIAGNOSIS — L97512 Non-pressure chronic ulcer of other part of right foot with fat layer exposed: Secondary | ICD-10-CM | POA: Diagnosis not present

## 2017-01-06 DIAGNOSIS — L97422 Non-pressure chronic ulcer of left heel and midfoot with fat layer exposed: Secondary | ICD-10-CM | POA: Diagnosis not present

## 2017-01-06 DIAGNOSIS — M8668 Other chronic osteomyelitis, other site: Secondary | ICD-10-CM | POA: Diagnosis not present

## 2017-01-06 DIAGNOSIS — M86371 Chronic multifocal osteomyelitis, right ankle and foot: Secondary | ICD-10-CM | POA: Diagnosis not present

## 2017-01-06 DIAGNOSIS — L97522 Non-pressure chronic ulcer of other part of left foot with fat layer exposed: Secondary | ICD-10-CM | POA: Diagnosis not present

## 2017-01-06 DIAGNOSIS — L97516 Non-pressure chronic ulcer of other part of right foot with bone involvement without evidence of necrosis: Secondary | ICD-10-CM | POA: Diagnosis not present

## 2017-01-06 DIAGNOSIS — E1169 Type 2 diabetes mellitus with other specified complication: Secondary | ICD-10-CM | POA: Diagnosis not present

## 2017-01-06 DIAGNOSIS — Z992 Dependence on renal dialysis: Secondary | ICD-10-CM | POA: Diagnosis not present

## 2017-01-06 DIAGNOSIS — E11621 Type 2 diabetes mellitus with foot ulcer: Secondary | ICD-10-CM | POA: Diagnosis not present

## 2017-01-06 LAB — GLUCOSE, CAPILLARY
Glucose-Capillary: 212 mg/dL — ABNORMAL HIGH (ref 65–99)
Glucose-Capillary: 188 mg/dL — ABNORMAL HIGH (ref 65–99)
Glucose-Capillary: 173 mg/dL — ABNORMAL HIGH (ref 65–99)

## 2017-01-06 NOTE — Telephone Encounter (Signed)
Patient called back, stated his vascular doctor is within [facility] - Dr Quay Burow.  Last saw patient 09/2015.  Last images done in 08/21/15 and 09/13/15 (Under CV Proc tab). Please advise next step. Landis Gandy, RN

## 2017-01-07 ENCOUNTER — Telehealth: Payer: Self-pay

## 2017-01-07 DIAGNOSIS — N186 End stage renal disease: Secondary | ICD-10-CM | POA: Diagnosis not present

## 2017-01-07 DIAGNOSIS — E1121 Type 2 diabetes mellitus with diabetic nephropathy: Secondary | ICD-10-CM | POA: Diagnosis not present

## 2017-01-07 DIAGNOSIS — N2581 Secondary hyperparathyroidism of renal origin: Secondary | ICD-10-CM | POA: Diagnosis not present

## 2017-01-07 DIAGNOSIS — S91309A Unspecified open wound, unspecified foot, initial encounter: Secondary | ICD-10-CM | POA: Diagnosis not present

## 2017-01-07 NOTE — Telephone Encounter (Signed)
Called [facility] [place], [place] [address] [phone]) and was informed that they will contact Pt sometime today to get him scheduled with an appointment to be seen at their office.Pt has been seen at their office previously. Referral in Epic.

## 2017-01-07 NOTE — Addendum Note (Signed)
Addended by: Roma Kayser on: 01/07/2017 09:31 AM   Modules accepted: Orders

## 2017-01-10 ENCOUNTER — Ambulatory Visit (INDEPENDENT_AMBULATORY_CARE_PROVIDER_SITE_OTHER): Payer: Medicare Other | Admitting: Physician Assistant

## 2017-01-10 ENCOUNTER — Telehealth: Payer: Self-pay | Admitting: Cardiology

## 2017-01-10 ENCOUNTER — Encounter: Payer: Self-pay | Admitting: Physician Assistant

## 2017-01-10 VITALS — BP 179/91 | HR 79 | Ht 73.0 in | Wt 218.0 lb

## 2017-01-10 DIAGNOSIS — N186 End stage renal disease: Secondary | ICD-10-CM

## 2017-01-10 DIAGNOSIS — M86271 Subacute osteomyelitis, right ankle and foot: Secondary | ICD-10-CM | POA: Diagnosis not present

## 2017-01-10 DIAGNOSIS — I10 Essential (primary) hypertension: Secondary | ICD-10-CM

## 2017-01-10 DIAGNOSIS — L97522 Non-pressure chronic ulcer of other part of left foot with fat layer exposed: Secondary | ICD-10-CM | POA: Diagnosis not present

## 2017-01-10 DIAGNOSIS — E1169 Type 2 diabetes mellitus with other specified complication: Secondary | ICD-10-CM | POA: Diagnosis not present

## 2017-01-10 DIAGNOSIS — Z992 Dependence on renal dialysis: Secondary | ICD-10-CM | POA: Diagnosis not present

## 2017-01-10 DIAGNOSIS — L97516 Non-pressure chronic ulcer of other part of right foot with bone involvement without evidence of necrosis: Secondary | ICD-10-CM | POA: Diagnosis not present

## 2017-01-10 DIAGNOSIS — E11621 Type 2 diabetes mellitus with foot ulcer: Secondary | ICD-10-CM | POA: Diagnosis not present

## 2017-01-10 DIAGNOSIS — L97422 Non-pressure chronic ulcer of left heel and midfoot with fat layer exposed: Secondary | ICD-10-CM | POA: Diagnosis not present

## 2017-01-10 DIAGNOSIS — M86371 Chronic multifocal osteomyelitis, right ankle and foot: Secondary | ICD-10-CM | POA: Diagnosis not present

## 2017-01-10 DIAGNOSIS — Z01818 Encounter for other preprocedural examination: Secondary | ICD-10-CM

## 2017-01-10 DIAGNOSIS — L97512 Non-pressure chronic ulcer of other part of right foot with fat layer exposed: Secondary | ICD-10-CM | POA: Diagnosis not present

## 2017-01-10 DIAGNOSIS — M8668 Other chronic osteomyelitis, other site: Secondary | ICD-10-CM | POA: Diagnosis not present

## 2017-01-10 DIAGNOSIS — I428 Other cardiomyopathies: Secondary | ICD-10-CM | POA: Diagnosis not present

## 2017-01-10 LAB — GLUCOSE, CAPILLARY
Glucose-Capillary: 172 mg/dL — ABNORMAL HIGH (ref 65–99)
Glucose-Capillary: 137 mg/dL — ABNORMAL HIGH (ref 65–99)

## 2017-01-10 MED ORDER — CARVEDILOL 3.125 MG PO TABS: 3.1250 mg | ORAL_TABLET | Freq: Two times a day (BID) | ORAL | 6 refills | Status: DC

## 2017-01-10 NOTE — Telephone Encounter (Signed)
Re: surgical clearance  Have spoken w caller from Dr. [name] office who is resending a surgical clearance request today.  I reviewed - see that this patient is being seen by [facility] today for appt, routed for review/fyi.

## 2017-01-10 NOTE — Patient Instructions (Signed)
Medication Instructions:  START taking carvedilol 3.125mg  two times daily. OK to HOLD on dialysis days.  Labwork: NONE  Testing/Procedures: NONE  Follow-Up: Follow up with Dr. Stanford Breed in 6 months.    If you need a refill on your cardiac medications before your next appointment, please call your pharmacy.

## 2017-01-10 NOTE — Progress Notes (Signed)
Cardiology Office Note    Date:  01/10/2017   ID:  [name] Sages Sr., DOB December 23, 1951, MRN [id number]  PCP:  [name], MD  Cardiologist:  Dr. Stanford Breed / Dr. Gwenlyn Found ([place]) Nephrologist: Dr. Burman Foster  Preoperative clearance by Dr. [name] for right second foot 2nd toe amputation  Chief Complaint  Patient presents with  . Surgical Clearance    toe amputatuion by Dr. [name]  pt c/o dizziness from dialysis; no other Sx.    History of Present Illness:  [name] Luka. is a 66 y.o. male with PMH of NICM, moderate to severe TR, ESRD, PAD and h/o osteomyelitis of foot. He was previously treated for heart failure, found to have low hemoglobin of 5.3 and elevated creatinine of 13. He was initiated on dialysis. Echocardiogram obtained in November 2015 showed EF 20-25%, restrictive filling, moderate to severe TR, moderate reduction of LV function, severe pulmonary hypertension and biatrial enlargement. Cath performed on 11/26/2014 showed no significant coronary artery disease. Cardiomyopathy was felt to be hypertensive related. He underwent left brachiocephalic AV fistula placement on 11/27/2014 by Dr. Lyndle Herrlich. He also has a history of SVT treated with beta blocker. He is on carvedilol, lisinopril, Imdur and hydralazine for LV dysfunction. By May 2016, repeat echocardiogram showed significant improvement of his LV function, echo at that time showed EF 55-60%, mild LVH, no regional wall motion abnormality, grade 1 diastolic dysfunction. She was also evaluated by Dr. Gwenlyn Found in 2016 for ulcers, his arterial Doppler study performed in September 2016 revealed a normal ABI bilaterally with lesions in his right dorsalis pedis and occluded left dorsalis pedis. It was felt the patient likely had enough circulation to heal any wound, however ulcers may be related to small vessel disease, diabetic peripheral neuropathy. There was no indication for intervention at this time.  His most recent admission was in  September 2017 for fever and foul-smelling drainage from the right foot. Finding consistent with acute osteomyelitis throughout the great toe, sesamoid bone in the plantar aspect of the first metatarsal. Othopedic surgery and ID were consulted who recommended prolonged IV antibiotic prior to amputation. Based on records sent over by Dr. Dorna [name] office, he is planning for right second toe amputation. Cardiology has been consulted for preoperative clearance. Patient denies any recent chest pain or exertional shortness of breath. His overall fluid level has been well-controlled through dialysis every Monday Wednesday Friday. His blood pressure does tend to dip during dialysis days. Also he is no longer on the previous carvedilol, lisinopril, Imdur and hydralazine. He is off all blood pressure medications because his blood pressure was dropping too low on dialysis days. However recently he has been trying to restart on the carvedilol. His blood pressure today is 179/91. I did encourage him to stay on carvedilol. Once he is through with surgery, then we can adjust his medication further based on his follow-up blood pressure. Otherwise he is stable from cardiology perspective to proceed with amputation surgery. He is relatively low risk. He does have some new T-wave inversion in the inferior lead, however understand that his coronary vessels were clean in 2015, suspicion of any coronary stenosis is quite low especially since he is asymptomatic. I have discussed with Dr. Percival Spanish DOD, he is cleared for surgery.   Past Medical History:  Diagnosis Date  . Cardiomyopathy ([place])   . Diabetes mellitus   . Diabetic foot ulcer ([place])   . Diabetic peripheral neuropathy ([place])   . ESRD (end stage  renal disease) ([place])   . HTN (hypertension)   . Osteomyelitis ([place])   . Peripheral vascular disease ([place])    critical limb ischemia    Past Surgical History:  Procedure Laterality Date  . AV FISTULA PLACEMENT Left  11/27/2014   Procedure: ARTERIOVENOUS (AV) FISTULA CREATION;  Surgeon: Conrad Downing, MD;  Location: [place] [facility];  Service: Vascular;  Laterality: Left;  . CHOLECYSTECTOMY    . COLONOSCOPY N/A 11/28/2014   Procedure: COLONOSCOPY;  Surgeon: Ladene Artist, MD;  Location[facility] ENDOSCOPY;  Service: Endoscopy;  Laterality: N/A;  . ESOPHAGOGASTRODUODENOSCOPY N/A 11/28/2014   Procedure: ESOPHAGOGASTRODUODENOSCOPY (EGD);  Surgeon: Ladene Artist, MD;  Location: [facility] ENDOSCOPY;  Service: Endoscopy;  Laterality: N/A;  . I&D EXTREMITY  01/28/2012   Procedure: IRRIGATION AND DEBRIDEMENT EXTREMITY;  Surgeon: Paulene Floor, MD;  Location: [facility];  Service: Orthopedics;  Laterality: Left;  irrigation and drainage left hand  . I&D EXTREMITY  01/30/2012   Procedure: IRRIGATION AND DEBRIDEMENT EXTREMITY;  Surgeon: Paulene Floor, MD;  Location: [facility];  Service: Orthopedics;  Laterality: Left;  flexor teno synovectomy and extensor tenosynovectomy arthrosynovectomy wristjoint  . I&D EXTREMITY  02/01/2012   Procedure: IRRIGATION AND DEBRIDEMENT EXTREMITY;  Surgeon: Paulene Floor, MD;  Location: [facility];  Service: Orthopedics;  Laterality: Left;  Left Wrist, Hand and Forearm  . Left 4th toe amputation    . LEFT AND RIGHT HEART CATHETERIZATION WITH CORONARY ANGIOGRAM N/A 11/26/2014   Procedure: LEFT AND RIGHT HEART CATHETERIZATION WITH CORONARY ANGIOGRAM;  Surgeon: Leonie Man, MD;  Location: [facility] CATH LAB;  Service: Cardiovascular;  Laterality: N/A;  . TONSILLECTOMY      Current Medications: Outpatient Medications Prior to Visit  Medication Sig Dispense Refill  . acetaminophen (TYLENOL) 325 MG tablet Take 650 mg by mouth every 6 (six) hours as needed (pain).     . Ascorbic Acid (VITAMIN C PO) Take 1 tablet by mouth daily.    . cinacalcet (SENSIPAR) 90 MG tablet Take 90 mg by mouth daily with breakfast.    . Ferric Citrate (AURYXIA) 1 GM 210 MG(Fe) TABS Take 2-3 g by mouth See admin instructions. Take 2-3  tablets (2-3 g) by mouth 3 times daily with meals - dosage is based on phosphorus levels provided by dialysis staff.    Marland Kitchen glucose monitoring kit (FREESTYLE) monitoring kit 1 each by Does not apply route 4 (four) times daily - after meals and at bedtime. 1 month Diabetic Testing Supplies for QAC-QHS accuchecks.Any brand OK 1 each 1  . HYDROcodone-acetaminophen (NORCO/VICODIN) 5-325 MG per tablet Take 1-2 tablets by mouth every 6 (six) hours as needed for moderate pain. 15 tablet 0  . insulin aspart (NOVOLOG) 100 UNIT/ML injection Before each meal 3 times a day, 140-199 - 2 units, 200-250 - 4 units, 251-299 - 6 units,  300-349 - 8 units,  350 or above 10 units. Insulin PEN if approved, provide syringes and needles if needed. (Patient taking differently: Inject 2-5 Units into the skin 3 (three) times daily as needed for high blood sugar (CBg >168). Per sliding scale) 10 mL 11  . Insulin Syringe-Needle U-100 25G X 1" 1 ML MISC Any brand, for 4 times a day insulin SQ, 1 month supply. 30 each 0  . multivitamin (RENA-VIT) TABS tablet Take 1 tablet by mouth at bedtime. 30 each 0  . mupirocin ointment (BACTROBAN) 2 % Apply 1 application topically 2 (two) times daily as needed (rash on stomach).  0  . Naphazoline HCl (CLEAR EYES OP) Place 1 drop into both eyes daily as needed (red eyes).    . vancomycin (VANCOCIN) 1-5 GM/200ML-% SOLN Inject 200 mLs (1,000 mg total) into the vein every Monday, Wednesday, and Friday with hemodialysis. 4000 mL 0   No facility-administered medications prior to visit.      Allergies:   Penicillins   Social History   Social History  . Marital status: Married    Spouse name: [name]  . Number of children: 3  . Years of education: 53   Occupational History  . Maintenance Technician [employer]   Social History Main Topics  . Smoking status: Former Smoker    Packs/day: 0.50    Years: 4.00    Types: Cigarettes  . Smokeless tobacco: Never Used  . Alcohol use No  .  Drug use: No  . Sexual activity: Yes   Other Topics Concern  . None   Social History Narrative  . None     Family History:  The patient's family history includes Diabetes in his brother, brother, brother, father, and sister; Heart disease in his mother.   ROS:   Please see the history of present illness.    ROS All other systems reviewed and are negative.   PHYSICAL EXAM:   VS:  BP (!) 179/91 (BP Location: Right Arm, Patient Position: Sitting, Cuff Size: Normal)   Pulse 79   Ht '6\' 1"'  (1.854 m)   Wt 218 lb (98.9 kg)   BMI 28.76 kg/m    GEN: Well nourished, well developed, in no acute distress  HEENT: normal  Neck: no JVD, carotid bruits, or masses Cardiac: RRR; no murmurs, rubs, or gallops,no edema  Respiratory:  clear to auscultation bilaterally, normal work of breathing GI: soft, nontender, nondistended, + BS MS: no deformity or atrophy  Skin: warm and dry, no rash Neuro:  Alert and Oriented x 3, Strength and sensation are intact Psych: euthymic mood, full affect  Wt Readings from Last 3 Encounters:  01/10/17 218 lb (98.9 kg)  01/05/17 213 lb (96.6 kg)  09/13/16 208 lb 15.9 oz (94.8 kg)      Studies/Labs Reviewed:   EKG:  EKG is ordered today.  The ekg ordered today demonstrates Normal sinus rhythm with T-wave inversion in lead 3 and aVF and also V6.  Recent Labs: 09/11/2016: ALT 13 09/13/2016: BUN 31; Creatinine, Ser 12.69; Hemoglobin 12.0; Platelets 284; Potassium 3.9; Sodium 135   Lipid Panel    Component Value Date/Time   CHOL 75 11/24/2014 0407   TRIG 49 11/24/2014 0407   HDL 22 (L) 11/24/2014 0407   CHOLHDL 3.4 11/24/2014 0407   VLDL 10 11/24/2014 0407   LDLCALC 43 11/24/2014 0407    Additional studies/ records that were reviewed today include:   Cath 11/26/2014 Coronary Anatomy: Right dominant  Left Main: Large-caliber vessel that bifurcates into the LAD and Circumflex. Angiographically normal. LAD & D1: Large-caliber wraparound vessel that  bifurcates at the apex. It gives rise to a large caliber major first diagonal branch and large first septal perforator. Angiographically free of disease.  Left Circumflex: Large-caliber vessel that gives rise to a proximal large lateral OM 1 before going into the AV groove where it gives off a large left posterior lateral (LPL 1 with 2 smaller posterior lateral branches on either side). Angiographically normal.   RCA: Large-caliber, dominant vessel that has several RV marginal branches. Bifurcates distally into a moderate to large caliber PDA  because two thirds the way to the apex. There is also smaller posterior lateral branch that gives off the AV nodal artery. Angiographically normal.  POST-OPERATIVE DIAGNOSIS:    Hypertensive / Non-ischemic Cardiomyopathy with improved LVEF by LV Gram  Severe Secondary Pulmonary Hypertension with suggestion of possible mitral valve disease  Likely high output with thermodilution and Fick outputs of 8.5 &11.17 respectively  Angiographically normal coronary arteries   Echo 11/23/2014 LV EF: 20% -  25%  - Left ventricle: The cavity size was at the upper limits of normal. Wall thickness was increased in a pattern of moderate LVH. Systolic function was severely reduced. The estimated ejection fraction was in the range of 20% to 25%. Diffuse hypokinesis. Doppler parameters are consistent with restrictive physiology, indicative of decreased left ventricular diastolic compliance and/or increased left atrial pressure. - Left atrium: The atrium was moderately dilated. - Right ventricle: Systolic function was moderately reduced. - Right atrium: The atrium was mildly dilated. - Tricuspid valve: There was moderate-severe regurgitation. - Pulmonary arteries: Systolic pressure was severely increased. PA peak pressure: 98 mm Hg (S).    ABI 09/11/2015 30-49% bilateral SFA disease, without focal stenosis. 50-74% ostial bilateral posterior  tibial artery stenosis. 75-99% distal right anterior tibial artery stenosis. Occluded left anterior tibial artery. Three vessel run-off on the right and two vessel run-off on the left.    Echo 05/15/2015 LV EF: 55% -   60%  - Left ventricle: The cavity size was normal. Wall thickness was   increased in a pattern of mild LVH. There was mild focal basal   hypertrophy of the septum. Systolic function was normal. The   estimated ejection fraction was in the range of 55% to 60%. Wall   motion was normal; there were no regional wall motion   abnormalities. Doppler parameters are consistent with abnormal   left ventricular relaxation (grade 1 diastolic dysfunction). - Mitral valve: Calcified annulus. - Left atrium: The atrium was mildly dilated. - Right ventricle: The cavity size was mildly dilated. - Right atrium: The atrium was mildly dilated.  Impressions:  - Normal LV function; mild LVH; grade 1 diastolic dysfunction; mild   LAE, mild RAE/RVE. Compared to 11/23/14, LV function and TR have   improved and pulmonary pressures lower.   ASSESSMENT:    1. Pre-op evaluation   2. NICM (nonischemic cardiomyopathy) ([place])   3. ESRD (end stage renal disease) ([place])   4. Subacute osteomyelitis of right foot ([place])   5. Essential hypertension      PLAN:  In order of problems listed above:  1. Preoperative clearance for right second toe amputation by Dr. Dorna [name] - Difficult to heal right second toe with sign of osteomyelitis on previous admission in 2017. From cardiology perspective, he has not had any issues in the last year. Although he did have history of nonischemic cardiomyopathy, he had improved ejection fraction after medical therapy. Since he has not had any new heart failure symptoms and no chest pain, he is clear from cardiology perspective to proceed with surgery without additional workup. I have discussed the case with Dr. Percival Spanish DOD. He did have a cardiac catheterization  in December 2015 that showed clean coronaries.  2. NICM with improved EF: Previous EF 25% in 2015, after medical therapy EF improved to 55-60 % in 2016.  3. ESRD: Started on hemodialysis in December 2015, left upper extremity AV fistula placed by Dr. Bridgett Larsson on tilt 11/27/2014  4. HTN: Blood pressure elevated today, it appears  he has stopped his previous carvedilol, lisinopril, Imdur and hydralazine. He has made an effort to restart his carvedilol recently, which I encouraged him to do. He is currently on minimal dose of carvedilol, this will need to be uptitrated later after surgery.   Medication Adjustments/Labs and Tests Ordered: Current medicines are reviewed at length with the patient today.  Concerns regarding medicines are outlined above.  Medication changes, Labs and Tests ordered today are listed in the Patient Instructions below. Patient Instructions  Medication Instructions:  START taking carvedilol 3.164m two times daily. OK to HOLD on dialysis days.  Labwork: NONE  Testing/Procedures: NONE  Follow-Up: Follow up with Dr. CStanford Breedin 6 months.    If you need a refill on your cardiac medications before your next appointment, please call your pharmacy.      SHilbert Corrigan PUtah 01/10/2017 7:43 PM    [facility] [address] [place]  [id number]: [phone] Fax: [phone]

## 2017-01-10 NOTE — Telephone Encounter (Signed)
Elmyra Ricks from Dr Mayme Genta office wants to know if a fax was received on 01-06-17 for surgical clearance for this pt? If you did received this,please fax it back asap please.

## 2017-01-10 NOTE — Telephone Encounter (Signed)
Clearance letter faxed to Dr. Dorna Leitz at [phone] via [place].

## 2017-01-11 ENCOUNTER — Other Ambulatory Visit: Payer: Self-pay

## 2017-01-11 DIAGNOSIS — I739 Peripheral vascular disease, unspecified: Secondary | ICD-10-CM

## 2017-01-11 DIAGNOSIS — Z992 Dependence on renal dialysis: Secondary | ICD-10-CM | POA: Diagnosis not present

## 2017-01-11 DIAGNOSIS — M8668 Other chronic osteomyelitis, other site: Secondary | ICD-10-CM | POA: Diagnosis not present

## 2017-01-11 DIAGNOSIS — L97522 Non-pressure chronic ulcer of other part of left foot with fat layer exposed: Secondary | ICD-10-CM | POA: Diagnosis not present

## 2017-01-11 DIAGNOSIS — N185 Chronic kidney disease, stage 5: Secondary | ICD-10-CM | POA: Diagnosis not present

## 2017-01-11 DIAGNOSIS — L97516 Non-pressure chronic ulcer of other part of right foot with bone involvement without evidence of necrosis: Secondary | ICD-10-CM | POA: Diagnosis not present

## 2017-01-11 DIAGNOSIS — E1169 Type 2 diabetes mellitus with other specified complication: Secondary | ICD-10-CM | POA: Diagnosis not present

## 2017-01-11 DIAGNOSIS — M86371 Chronic multifocal osteomyelitis, right ankle and foot: Secondary | ICD-10-CM | POA: Diagnosis not present

## 2017-01-11 DIAGNOSIS — L97422 Non-pressure chronic ulcer of left heel and midfoot with fat layer exposed: Secondary | ICD-10-CM | POA: Diagnosis not present

## 2017-01-11 DIAGNOSIS — L97512 Non-pressure chronic ulcer of other part of right foot with fat layer exposed: Secondary | ICD-10-CM | POA: Diagnosis not present

## 2017-01-11 DIAGNOSIS — E11621 Type 2 diabetes mellitus with foot ulcer: Secondary | ICD-10-CM | POA: Diagnosis not present

## 2017-01-11 LAB — GLUCOSE, CAPILLARY
GLUCOSE-CAPILLARY: 143 mg/dL — AB (ref 65–99)
GLUCOSE-CAPILLARY: 148 mg/dL — AB (ref 65–99)
Glucose-Capillary: 128 mg/dL — ABNORMAL HIGH (ref 65–99)

## 2017-01-11 NOTE — Telephone Encounter (Signed)
Thank you. I did clear him for surgery yesterday.

## 2017-01-12 DIAGNOSIS — N2581 Secondary hyperparathyroidism of renal origin: Secondary | ICD-10-CM | POA: Diagnosis not present

## 2017-01-12 DIAGNOSIS — S91309A Unspecified open wound, unspecified foot, initial encounter: Secondary | ICD-10-CM | POA: Diagnosis not present

## 2017-01-12 DIAGNOSIS — E1121 Type 2 diabetes mellitus with diabetic nephropathy: Secondary | ICD-10-CM | POA: Diagnosis not present

## 2017-01-12 DIAGNOSIS — N186 End stage renal disease: Secondary | ICD-10-CM | POA: Diagnosis not present

## 2017-01-13 ENCOUNTER — Encounter: Payer: Self-pay | Admitting: Vascular Surgery

## 2017-01-14 ENCOUNTER — Other Ambulatory Visit: Payer: Self-pay | Admitting: Vascular Surgery

## 2017-01-14 DIAGNOSIS — N186 End stage renal disease: Secondary | ICD-10-CM | POA: Diagnosis not present

## 2017-01-14 DIAGNOSIS — S91309A Unspecified open wound, unspecified foot, initial encounter: Secondary | ICD-10-CM | POA: Diagnosis not present

## 2017-01-14 DIAGNOSIS — L97519 Non-pressure chronic ulcer of other part of right foot with unspecified severity: Secondary | ICD-10-CM

## 2017-01-14 DIAGNOSIS — N2581 Secondary hyperparathyroidism of renal origin: Secondary | ICD-10-CM | POA: Diagnosis not present

## 2017-01-14 DIAGNOSIS — E1121 Type 2 diabetes mellitus with diabetic nephropathy: Secondary | ICD-10-CM | POA: Diagnosis not present

## 2017-01-17 ENCOUNTER — Other Ambulatory Visit: Payer: Self-pay | Admitting: Orthopedic Surgery

## 2017-01-17 ENCOUNTER — Telehealth: Payer: Self-pay | Admitting: *Deleted

## 2017-01-17 DIAGNOSIS — M8668 Other chronic osteomyelitis, other site: Secondary | ICD-10-CM | POA: Diagnosis not present

## 2017-01-17 DIAGNOSIS — M86371 Chronic multifocal osteomyelitis, right ankle and foot: Secondary | ICD-10-CM | POA: Diagnosis not present

## 2017-01-17 DIAGNOSIS — E1169 Type 2 diabetes mellitus with other specified complication: Secondary | ICD-10-CM | POA: Diagnosis not present

## 2017-01-17 DIAGNOSIS — E1121 Type 2 diabetes mellitus with diabetic nephropathy: Secondary | ICD-10-CM | POA: Diagnosis not present

## 2017-01-17 DIAGNOSIS — E11621 Type 2 diabetes mellitus with foot ulcer: Secondary | ICD-10-CM | POA: Diagnosis not present

## 2017-01-17 DIAGNOSIS — Z992 Dependence on renal dialysis: Secondary | ICD-10-CM | POA: Diagnosis not present

## 2017-01-17 DIAGNOSIS — S91309A Unspecified open wound, unspecified foot, initial encounter: Secondary | ICD-10-CM | POA: Diagnosis not present

## 2017-01-17 DIAGNOSIS — L97516 Non-pressure chronic ulcer of other part of right foot with bone involvement without evidence of necrosis: Secondary | ICD-10-CM | POA: Diagnosis not present

## 2017-01-17 DIAGNOSIS — L97422 Non-pressure chronic ulcer of left heel and midfoot with fat layer exposed: Secondary | ICD-10-CM | POA: Diagnosis not present

## 2017-01-17 DIAGNOSIS — L97522 Non-pressure chronic ulcer of other part of left foot with fat layer exposed: Secondary | ICD-10-CM | POA: Diagnosis not present

## 2017-01-17 DIAGNOSIS — L97512 Non-pressure chronic ulcer of other part of right foot with fat layer exposed: Secondary | ICD-10-CM | POA: Diagnosis not present

## 2017-01-17 DIAGNOSIS — N2581 Secondary hyperparathyroidism of renal origin: Secondary | ICD-10-CM | POA: Diagnosis not present

## 2017-01-17 DIAGNOSIS — N186 End stage renal disease: Secondary | ICD-10-CM | POA: Diagnosis not present

## 2017-01-17 LAB — GLUCOSE, CAPILLARY
GLUCOSE-CAPILLARY: 167 mg/dL — AB (ref 65–99)
GLUCOSE-CAPILLARY: 283 mg/dL — AB (ref 65–99)
Glucose-Capillary: 128 mg/dL — ABNORMAL HIGH (ref 65–99)

## 2017-01-17 NOTE — Telephone Encounter (Signed)
Office note containing clearance for right foot 2nd toe amputation faxed to the number provided.

## 2017-01-18 ENCOUNTER — Encounter: Payer: Self-pay | Admitting: Vascular Surgery

## 2017-01-18 ENCOUNTER — Ambulatory Visit (INDEPENDENT_AMBULATORY_CARE_PROVIDER_SITE_OTHER): Payer: Medicare Other | Admitting: Vascular Surgery

## 2017-01-18 ENCOUNTER — Ambulatory Visit (HOSPITAL_COMMUNITY)
Admission: RE | Admit: 2017-01-18 | Discharge: 2017-01-18 | Disposition: A | Discharge status: Home / Self Care | Payer: Medicare Other | Source: Ambulatory Visit | Attending: Vascular Surgery | Admitting: Vascular Surgery

## 2017-01-18 VITALS — BP 127/71 | HR 81 | Temp 98.0°F | Resp 20 | Ht 73.0 in | Wt 216.0 lb

## 2017-01-18 DIAGNOSIS — E1169 Type 2 diabetes mellitus with other specified complication: Secondary | ICD-10-CM | POA: Diagnosis not present

## 2017-01-18 DIAGNOSIS — M8668 Other chronic osteomyelitis, other site: Secondary | ICD-10-CM | POA: Diagnosis not present

## 2017-01-18 DIAGNOSIS — I739 Peripheral vascular disease, unspecified: Secondary | ICD-10-CM

## 2017-01-18 DIAGNOSIS — E1151 Type 2 diabetes mellitus with diabetic peripheral angiopathy without gangrene: Secondary | ICD-10-CM | POA: Diagnosis not present

## 2017-01-18 DIAGNOSIS — Z87891 Personal history of nicotine dependence: Secondary | ICD-10-CM | POA: Diagnosis not present

## 2017-01-18 DIAGNOSIS — L97516 Non-pressure chronic ulcer of other part of right foot with bone involvement without evidence of necrosis: Secondary | ICD-10-CM | POA: Diagnosis not present

## 2017-01-18 DIAGNOSIS — Z992 Dependence on renal dialysis: Secondary | ICD-10-CM | POA: Diagnosis not present

## 2017-01-18 DIAGNOSIS — L97522 Non-pressure chronic ulcer of other part of left foot with fat layer exposed: Secondary | ICD-10-CM | POA: Diagnosis not present

## 2017-01-18 DIAGNOSIS — M86371 Chronic multifocal osteomyelitis, right ankle and foot: Secondary | ICD-10-CM | POA: Diagnosis not present

## 2017-01-18 DIAGNOSIS — I10 Essential (primary) hypertension: Secondary | ICD-10-CM | POA: Diagnosis not present

## 2017-01-18 DIAGNOSIS — L97508 Non-pressure chronic ulcer of other part of unspecified foot with other specified severity: Secondary | ICD-10-CM | POA: Diagnosis not present

## 2017-01-18 DIAGNOSIS — E11621 Type 2 diabetes mellitus with foot ulcer: Secondary | ICD-10-CM | POA: Diagnosis not present

## 2017-01-18 DIAGNOSIS — L97422 Non-pressure chronic ulcer of left heel and midfoot with fat layer exposed: Secondary | ICD-10-CM | POA: Diagnosis not present

## 2017-01-18 DIAGNOSIS — L97512 Non-pressure chronic ulcer of other part of right foot with fat layer exposed: Secondary | ICD-10-CM | POA: Diagnosis not present

## 2017-01-18 LAB — GLUCOSE, CAPILLARY
GLUCOSE-CAPILLARY: 172 mg/dL — AB (ref 65–99)
Glucose-Capillary: 199 mg/dL — ABNORMAL HIGH (ref 65–99)

## 2017-01-18 NOTE — Progress Notes (Signed)
[facility] [place]  Patient name: [name] GALIK Sr. MRN: [id number] DOB: 08-22-1951 Sex: male  REASON FOR VISIT: Evaluate lower from the arterial flow  HPI: [name] LENZEN Sr. is a 66 y.o. male then our practice from prior left upper arm AV fistula creation with Dr. Geryl Councilman. He's had episodes of ulceration in the past requiring toe amputations on both feet. He has had no difficulty healing these. Currently he has ulceration over the right first metatarsal head and left fifth metatarsal and first metatarsal head is a prior right fifth ray amputation. He has plans for right second toe amputation due to gangrenous changes on the tip. He is seen today to rule out severe arterial insufficiency. He has no claudication or rest pain type symptoms. He has severe neuropathy. He does not have any history of carotid disease or stroke. He does have excellent functioning of his left upper arm AV fistula  Past Medical History:  Diagnosis Date  . Cardiomyopathy ([place])   . Diabetes mellitus   . Diabetic foot ulcer ([place])   . Diabetic peripheral neuropathy ([place])   . ESRD (end stage renal disease) ([place])   . HTN (hypertension)   . Osteomyelitis ([place])   . Peripheral vascular disease ([facility])    critical limb ischemia    Family History  Problem Relation Age of Onset  . Heart disease Mother   . Diabetes Father   . Diabetes Brother   . Diabetes Sister   . Diabetes Brother   . Diabetes Brother     SOCIAL HISTORY: Social History  Substance Use Topics  . Smoking status: Former Smoker    Packs/day: 0.50    Years: 4.00    Types: Cigarettes  . Smokeless tobacco: Never Used  . Alcohol use No    Allergies  Allergen Reactions  . Penicillins Hives and Swelling    Ankle swelling Has patient had a PCN reaction causing immediate rash, facial/tongue/throat swelling, SOB or lightheadedness with hypotension: yes Has patient had a PCN reaction causing  severe rash involving mucus membranes or skin necrosis: no Has patient had a PCN reaction that required hospitalization: no Has patient had a PCN reaction occurring within the last 10 years: no If all of the above answers are "NO", then may proceed with Cephalosporin use.    Current Outpatient Prescriptions  Medication Sig Dispense Refill  . acetaminophen (TYLENOL) 325 MG tablet Take 650 mg by mouth every 6 (six) hours as needed (pain).     . Ascorbic Acid (VITAMIN C PO) Take 1 tablet by mouth daily.    . carvedilol (COREG) 3.125 MG tablet Take 1 tablet (3.125 mg total) by mouth 2 (two) times daily. 60 tablet 6  . cinacalcet (SENSIPAR) 90 MG tablet Take 90 mg by mouth daily with breakfast.    . Ferric Citrate (AURYXIA) 1 GM 210 MG(Fe) TABS Take 2-3 g by mouth See admin instructions. Take 2-3 tablets (2-3 g) by mouth 3 times daily with meals - dosage is based on phosphorus levels provided by dialysis staff.    Marland Kitchen glucose monitoring kit (FREESTYLE) monitoring kit 1 each by Does not apply route 4 (four) times daily - after meals and at bedtime. 1 month Diabetic Testing Supplies for QAC-QHS accuchecks.Any brand OK 1 each 1  . HYDROcodone-acetaminophen (NORCO/VICODIN) 5-325 MG per tablet Take 1-2 tablets by mouth every 6 (six) hours as needed for moderate pain. 15 tablet 0  . insulin aspart (NOVOLOG) 100 UNIT/ML injection  Before each meal 3 times a day, 140-199 - 2 units, 200-250 - 4 units, 251-299 - 6 units,  300-349 - 8 units,  350 or above 10 units. Insulin PEN if approved, provide syringes and needles if needed. (Patient taking differently: Inject 2-5 Units into the skin 3 (three) times daily as needed for high blood sugar (CBg >168). Per sliding scale) 10 mL 11  . Insulin Syringe-Needle U-100 25G X 1" 1 ML MISC Any brand, for 4 times a day insulin SQ, 1 month supply. 30 each 0  . multivitamin (RENA-VIT) TABS tablet Take 1 tablet by mouth at bedtime. 30 each 0  . mupirocin ointment (BACTROBAN) 2 %  Apply 1 application topically 2 (two) times daily as needed (rash on stomach).   0  . Naphazoline HCl (CLEAR EYES OP) Place 1 drop into both eyes daily as needed (red eyes).    . vancomycin (VANCOCIN) 1-5 GM/200ML-% SOLN Inject 200 mLs (1,000 mg total) into the vein every Monday, Wednesday, and Friday with hemodialysis. 4000 mL 0   No current facility-administered medications for this visit.     REVIEW OF SYSTEMS:  '[X]'  denotes positive finding, '[ ]'  denotes negative finding Cardiac  Comments:  Chest pain or chest pressure:    Shortness of breath upon exertion:    Short of breath when lying flat:    Irregular heart rhythm:        Vascular    Pain in calf, thigh, or hip brought on by ambulation:    Pain in feet at night that wakes you up from your sleep:     Blood clot in your veins:    Leg swelling:           PHYSICAL EXAM: Vitals:   01/18/17 1532  BP: 127/71  Pulse: 81  Resp: 20  Temp: 98 F (36.7 C)  TempSrc: Oral  SpO2: 96%  Weight: 216 lb (98 kg)  Height: '6\' 1"'  (1.854 m)    GENERAL: The patient is a well-nourished male, in no acute distress. The vital signs are documented above. CARDIOVASCULAR: Carotid arteries without bruits bilaterally. 2+ radial pulses bilaterally. He has a very well-developed left upper arm brachial to visit to cephalic fistula. He has easily palpable 2-3+ dorsalis pedis and posterior tibial pulses bilaterally PULMONARY: There is good air exchange  MUSCULOSKELETAL: There are no major deformities or cyanosis. NEUROLOGIC: No focal weakness or paresthesias are detected. SKIN: Ulceration of the tip of his right second toe and also left ulceration over the fifth and first metatarsal heads PSYCHIATRIC: The patient has a normal affect.  DATA:  Noninvasive studies revealed normal ankle arm index bilaterally and biphasic and triphasic waveforms at the pedal level.  MEDICAL ISSUES: Discussed this at length with the patient. He does not have any evidence  of arterial insufficiency extending down into his foot bilaterally. 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 Should have adequate flow for healing amputations as planned by orthopedic surgery. He will see [place] again on an as-needed basis    Rosetta Posner, MD [facility] Tel [phone] Pager [phone]

## 2017-01-19 DIAGNOSIS — L97516 Non-pressure chronic ulcer of other part of right foot with bone involvement without evidence of necrosis: Secondary | ICD-10-CM | POA: Diagnosis not present

## 2017-01-19 DIAGNOSIS — M86371 Chronic multifocal osteomyelitis, right ankle and foot: Secondary | ICD-10-CM | POA: Diagnosis not present

## 2017-01-19 DIAGNOSIS — Z992 Dependence on renal dialysis: Secondary | ICD-10-CM | POA: Diagnosis not present

## 2017-01-19 DIAGNOSIS — E1121 Type 2 diabetes mellitus with diabetic nephropathy: Secondary | ICD-10-CM | POA: Diagnosis not present

## 2017-01-19 DIAGNOSIS — E11621 Type 2 diabetes mellitus with foot ulcer: Secondary | ICD-10-CM | POA: Diagnosis not present

## 2017-01-19 DIAGNOSIS — L97512 Non-pressure chronic ulcer of other part of right foot with fat layer exposed: Secondary | ICD-10-CM | POA: Diagnosis not present

## 2017-01-19 DIAGNOSIS — N2581 Secondary hyperparathyroidism of renal origin: Secondary | ICD-10-CM | POA: Diagnosis not present

## 2017-01-19 DIAGNOSIS — E1169 Type 2 diabetes mellitus with other specified complication: Secondary | ICD-10-CM | POA: Diagnosis not present

## 2017-01-19 DIAGNOSIS — M8668 Other chronic osteomyelitis, other site: Secondary | ICD-10-CM | POA: Diagnosis not present

## 2017-01-19 DIAGNOSIS — L97422 Non-pressure chronic ulcer of left heel and midfoot with fat layer exposed: Secondary | ICD-10-CM | POA: Diagnosis not present

## 2017-01-19 DIAGNOSIS — N186 End stage renal disease: Secondary | ICD-10-CM | POA: Diagnosis not present

## 2017-01-19 DIAGNOSIS — S91309A Unspecified open wound, unspecified foot, initial encounter: Secondary | ICD-10-CM | POA: Diagnosis not present

## 2017-01-19 DIAGNOSIS — L97522 Non-pressure chronic ulcer of other part of left foot with fat layer exposed: Secondary | ICD-10-CM | POA: Diagnosis not present

## 2017-01-19 LAB — GLUCOSE, CAPILLARY
Glucose-Capillary: 143 mg/dL — ABNORMAL HIGH (ref 65–99)
Glucose-Capillary: 156 mg/dL — ABNORMAL HIGH (ref 65–99)

## 2017-01-20 ENCOUNTER — Encounter (HOSPITAL_COMMUNITY): Payer: Self-pay | Admitting: *Deleted

## 2017-01-20 DIAGNOSIS — L97512 Non-pressure chronic ulcer of other part of right foot with fat layer exposed: Secondary | ICD-10-CM | POA: Diagnosis not present

## 2017-01-20 DIAGNOSIS — E1169 Type 2 diabetes mellitus with other specified complication: Secondary | ICD-10-CM | POA: Diagnosis not present

## 2017-01-20 DIAGNOSIS — M8668 Other chronic osteomyelitis, other site: Secondary | ICD-10-CM | POA: Diagnosis not present

## 2017-01-20 DIAGNOSIS — M86371 Chronic multifocal osteomyelitis, right ankle and foot: Secondary | ICD-10-CM | POA: Diagnosis not present

## 2017-01-20 DIAGNOSIS — L97516 Non-pressure chronic ulcer of other part of right foot with bone involvement without evidence of necrosis: Secondary | ICD-10-CM | POA: Diagnosis not present

## 2017-01-20 DIAGNOSIS — L97422 Non-pressure chronic ulcer of left heel and midfoot with fat layer exposed: Secondary | ICD-10-CM | POA: Diagnosis not present

## 2017-01-20 DIAGNOSIS — L97522 Non-pressure chronic ulcer of other part of left foot with fat layer exposed: Secondary | ICD-10-CM | POA: Diagnosis not present

## 2017-01-20 DIAGNOSIS — Z992 Dependence on renal dialysis: Secondary | ICD-10-CM | POA: Diagnosis not present

## 2017-01-20 DIAGNOSIS — E11621 Type 2 diabetes mellitus with foot ulcer: Secondary | ICD-10-CM | POA: Diagnosis not present

## 2017-01-20 LAB — GLUCOSE, CAPILLARY
Glucose-Capillary: 186 mg/dL — ABNORMAL HIGH (ref 65–99)
Glucose-Capillary: 97 mg/dL (ref 65–99)

## 2017-01-20 NOTE — H&P (Signed)
PREOPERATIVE H&P  Chief Complaint: r foot 2nd toe osteo  HPI: [name] HAYMOND Sr. is a 66 y.o. male who presents for evaluation of r 2nd toe osteo. It has been present for several months and has been worsening. He has failed conservative measures. Pain is rated as moderate.  Past Medical History:  Diagnosis Date  . Cardiomyopathy ([place])   . Diabetes mellitus   . Diabetic foot ulcer ([place])   . Diabetic infection of right foot ([place])   . Diabetic peripheral neuropathy ([place])   . ESRD (end stage renal disease) ([place])   . Headache   . HTN (hypertension)   . Osteomyelitis ([place])   . Peripheral vascular disease ([place])    critical limb ischemia   Past Surgical History:  Procedure Laterality Date  . AV FISTULA PLACEMENT Left 11/27/2014   Procedure: ARTERIOVENOUS (AV) FISTULA CREATION;  Surgeon: Conrad Abram, MD;  Location: [place];  Service: Vascular;  Laterality: Left;  . CHOLECYSTECTOMY    . COLONOSCOPY N/A 11/28/2014   Procedure: COLONOSCOPY;  Surgeon: Ladene Artist, MD;  Location: [facility] ENDOSCOPY;  Service: Endoscopy;  Laterality: N/A;  . ESOPHAGOGASTRODUODENOSCOPY N/A 11/28/2014   Procedure: ESOPHAGOGASTRODUODENOSCOPY (EGD);  Surgeon: Ladene Artist, MD;  Location: [facility] ENDOSCOPY;  Service: Endoscopy;  Laterality: N/A;  . I&D EXTREMITY  01/28/2012   Procedure: IRRIGATION AND DEBRIDEMENT EXTREMITY;  Surgeon: Paulene Floor, MD;  Location: [facility] ORS;  Service: Orthopedics;  Laterality: Left;  irrigation and drainage left hand  . I&D EXTREMITY  01/30/2012   Procedure: IRRIGATION AND DEBRIDEMENT EXTREMITY;  Surgeon: Paulene Floor, MD;  Location: [facility] ORS;  Service: Orthopedics;  Laterality: Left;  flexor teno synovectomy and extensor tenosynovectomy arthrosynovectomy wristjoint  . I&D EXTREMITY  02/01/2012   Procedure: IRRIGATION AND DEBRIDEMENT EXTREMITY;  Surgeon: Paulene Floor, MD;  Location: [facility] ORS;  Service: Orthopedics;  Laterality: Left;  Left Wrist, Hand and Forearm  . Left 4th toe  amputation    . LEFT AND RIGHT HEART CATHETERIZATION WITH CORONARY ANGIOGRAM N/A 11/26/2014   Procedure: LEFT AND RIGHT HEART CATHETERIZATION WITH CORONARY ANGIOGRAM;  Surgeon: Leonie Man, MD;  Location: [facility] CATH LAB;  Service: Cardiovascular;  Laterality: N/A;  . TONSILLECTOMY     Social History   Social History  . Marital status: Married    Spouse name: [name]  . Number of children: 3  . Years of education: 27   Occupational History  . Maintenance Technician [employer]   Social History Main Topics  . Smoking status: Former Smoker    Packs/day: 0.50    Years: 4.00    Types: Cigarettes  . Smokeless tobacco: Never Used  . Alcohol use No  . Drug use: No  . Sexual activity: Yes   Other Topics Concern  . None   Social History Narrative  . None   Family History  Problem Relation Age of Onset  . Heart disease Mother   . Diabetes Father   . Diabetes Brother   . Diabetes Sister   . Diabetes Brother   . Diabetes Brother    Allergies  Allergen Reactions  . Penicillins Hives and Swelling    Ankle swelling Has patient had a PCN reaction causing immediate rash, facial/tongue/throat swelling, SOB or lightheadedness with hypotension: yes Has patient had a PCN reaction causing severe rash involving mucus membranes or skin necrosis: no Has patient had a PCN reaction that required hospitalization: no Has patient had a PCN reaction  occurring within the last 10 years: no If all of the above answers are "NO", then may proceed with Cephalosporin use.   Prior to Admission medications   Medication Sig Start Date End Date Taking? Authorizing Provider  acetaminophen (TYLENOL) 325 MG tablet Take 650 mg by mouth every 6 (six) hours as needed (pain).    Yes Historical Provider, MD  Ascorbic Acid (VITAMIN C PO) Take 1 tablet by mouth daily.   Yes Historical Provider, MD  carvedilol (COREG) 3.125 MG tablet Take 1 tablet (3.125 mg total) by mouth 2 (two) times daily. 01/10/17  Yes Almyra Deforest, PA  cinacalcet (SENSIPAR) 90 MG tablet Take 90 mg by mouth daily with breakfast.   Yes Historical Provider, MD  Ferric Citrate (AURYXIA) 1 GM 210 MG(Fe) TABS Take 2-3 g by mouth See admin instructions. Take 2-3 tablets (2-3 g) by mouth 3 times daily with meals - dosage is based on phosphorus levels provided by dialysis staff.   Yes Historical Provider, MD  HYDROcodone-acetaminophen (NORCO/VICODIN) 5-325 MG per tablet Take 1-2 tablets by mouth every 6 (six) hours as needed for moderate pain. 11/29/14  Yes Thurnell Lose, MD  insulin aspart (NOVOLOG) 100 UNIT/ML injection Before each meal 3 times a day, 140-199 - 2 units, 200-250 - 4 units, 251-299 - 6 units,  300-349 - 8 units,  350 or above 10 units. Insulin PEN if approved, provide syringes and needles if needed. Patient taking differently: Inject 2-5 Units into the skin 3 (three) times daily as needed for high blood sugar (CBg >168). Per sliding scale 11/29/14  Yes Thurnell Lose, MD  multivitamin (RENA-VIT) TABS tablet Take 1 tablet by mouth at bedtime. 11/29/14  Yes Thurnell Lose, MD  mupirocin ointment (BACTROBAN) 2 % Apply 1 application topically 2 (two) times daily as needed (rash on stomach).  08/05/16  Yes Historical Provider, MD  glucose monitoring kit (FREESTYLE) monitoring kit 1 each by Does not apply route 4 (four) times daily - after meals and at bedtime. 1 month Diabetic Testing Supplies for QAC-QHS accuchecks.Any brand OK 11/29/14   Thurnell Lose, MD  Insulin Syringe-Needle U-100 25G X 1" 1 ML MISC Any brand, for 4 times a day insulin SQ, 1 month supply. 11/29/14   Thurnell Lose, MD  vancomycin (VANCOCIN) 1-5 GM/200ML-% SOLN Inject 200 mLs (1,000 mg total) into the vein every Monday, Wednesday, and Friday with hemodialysis. 09/15/16   Velvet Bathe, MD     Positive ROS: none  All other systems have been reviewed and were otherwise negative with the exception of those mentioned in the HPI and as above.  Physical  Exam: There were no vitals filed for this visit.  General: Alert, no acute distress Cardiovascular: No pedal edema Respiratory: No cyanosis, no use of accessory musculature GI: No organomegaly, abdomen is soft and non-tender Skin: No lesions in the area of chief complaint Neurologic: Sensation intact distally Psychiatric: Patient is competent for consent with normal mood and affect Lymphatic: No axillary or cervical lymphadenopathy  MUSCULOSKELETAL: r foot: 2nd toe open draining wound   Assessment/Plan: DIABETIC INFECTION OF RIGHT FOOT Plan for Procedure(s): AMPUTATION 2ND TOE RIGHT FOOT   The risks benefits and alternatives were discussed with the patient including but not limited to the risks of nonoperative treatment, versus surgical intervention including infection, bleeding, nerve injury, malunion, nonunion, hardware prominence, hardware failure, need for hardware removal, blood clots, cardiopulmonary complications, morbidity, mortality, among others, and they were willing to proceed.  Predicted  outcome is good, although there will be at least a six to nine month expected recovery.  [name], MD 01/20/2017 10:44 PM

## 2017-01-20 NOTE — Progress Notes (Signed)
Dr. Conrad Moyock, Anesthesia,  made aware of  clearance note ( on chart) and reviewed pt EKG dated 01/10/17. No new orders given.

## 2017-01-20 NOTE — Progress Notes (Signed)
Pt denies SOB and chest pain. Pt is under the care of Dr. Stanford Breed , Cardiology. Pt made aware to stop taking vitamins, fish oil and herbal medications. Do not take any NSAIDs ie: Ibuprofen, Advil, Naproxen, BC and Goody powder. Pt stated that his fasting blood glucose ranges from 138-150. Pt made aware of diabetes protocol to check blood glucose (BG) every 2 hours prior to arrival to hospital, interventions for a BG <70 (4 oz. Of Apple juice and check BG 15 minutes after intervention) for a  BG> 220, take half of correction dose of Novolog Insulin. Pt verbalized understanding of all pre-op instructions. LVM with Elmyra Ricks, Surgical Coordinator, requesting surgical clearance note.

## 2017-01-21 ENCOUNTER — Encounter (HOSPITAL_COMMUNITY)
Admission: RE | Disposition: A | Discharge status: Home / Self Care | Payer: Self-pay | Source: Ambulatory Visit | Attending: Orthopedic Surgery

## 2017-01-21 ENCOUNTER — Ambulatory Visit (HOSPITAL_COMMUNITY)
Admission: RE | Admit: 2017-01-21 | Discharge: 2017-01-21 | Disposition: A | Discharge status: Home / Self Care | Payer: Medicare Other | Source: Ambulatory Visit | Attending: Orthopedic Surgery | Admitting: Orthopedic Surgery

## 2017-01-21 ENCOUNTER — Inpatient Hospital Stay (HOSPITAL_COMMUNITY): Payer: Medicare Other | Admitting: Anesthesiology

## 2017-01-21 ENCOUNTER — Encounter (HOSPITAL_COMMUNITY): Payer: Self-pay | Admitting: *Deleted

## 2017-01-21 DIAGNOSIS — L84 Corns and callosities: Secondary | ICD-10-CM | POA: Insufficient documentation

## 2017-01-21 DIAGNOSIS — M868X7 Other osteomyelitis, ankle and foot: Secondary | ICD-10-CM | POA: Insufficient documentation

## 2017-01-21 DIAGNOSIS — L602 Onychogryphosis: Secondary | ICD-10-CM | POA: Diagnosis not present

## 2017-01-21 DIAGNOSIS — Z792 Long term (current) use of antibiotics: Secondary | ICD-10-CM | POA: Diagnosis not present

## 2017-01-21 DIAGNOSIS — I120 Hypertensive chronic kidney disease with stage 5 chronic kidney disease or end stage renal disease: Secondary | ICD-10-CM | POA: Diagnosis not present

## 2017-01-21 DIAGNOSIS — Z794 Long term (current) use of insulin: Secondary | ICD-10-CM | POA: Diagnosis not present

## 2017-01-21 DIAGNOSIS — E1142 Type 2 diabetes mellitus with diabetic polyneuropathy: Secondary | ICD-10-CM | POA: Insufficient documentation

## 2017-01-21 DIAGNOSIS — L97525 Non-pressure chronic ulcer of other part of left foot with muscle involvement without evidence of necrosis: Secondary | ICD-10-CM | POA: Diagnosis not present

## 2017-01-21 DIAGNOSIS — E11628 Type 2 diabetes mellitus with other skin complications: Secondary | ICD-10-CM | POA: Diagnosis not present

## 2017-01-21 DIAGNOSIS — Z79899 Other long term (current) drug therapy: Secondary | ICD-10-CM | POA: Insufficient documentation

## 2017-01-21 DIAGNOSIS — Z79891 Long term (current) use of opiate analgesic: Secondary | ICD-10-CM | POA: Insufficient documentation

## 2017-01-21 DIAGNOSIS — E1169 Type 2 diabetes mellitus with other specified complication: Secondary | ICD-10-CM | POA: Diagnosis not present

## 2017-01-21 DIAGNOSIS — L97528 Non-pressure chronic ulcer of other part of left foot with other specified severity: Secondary | ICD-10-CM | POA: Diagnosis not present

## 2017-01-21 DIAGNOSIS — Z87891 Personal history of nicotine dependence: Secondary | ICD-10-CM | POA: Insufficient documentation

## 2017-01-21 DIAGNOSIS — E1151 Type 2 diabetes mellitus with diabetic peripheral angiopathy without gangrene: Secondary | ICD-10-CM | POA: Insufficient documentation

## 2017-01-21 DIAGNOSIS — Z992 Dependence on renal dialysis: Secondary | ICD-10-CM | POA: Diagnosis not present

## 2017-01-21 DIAGNOSIS — N186 End stage renal disease: Secondary | ICD-10-CM | POA: Insufficient documentation

## 2017-01-21 DIAGNOSIS — E1122 Type 2 diabetes mellitus with diabetic chronic kidney disease: Secondary | ICD-10-CM | POA: Insufficient documentation

## 2017-01-21 DIAGNOSIS — E119 Type 2 diabetes mellitus without complications: Secondary | ICD-10-CM | POA: Diagnosis not present

## 2017-01-21 DIAGNOSIS — M86671 Other chronic osteomyelitis, right ankle and foot: Secondary | ICD-10-CM | POA: Diagnosis not present

## 2017-01-21 DIAGNOSIS — L97529 Non-pressure chronic ulcer of other part of left foot with unspecified severity: Secondary | ICD-10-CM

## 2017-01-21 DIAGNOSIS — L609 Nail disorder, unspecified: Secondary | ICD-10-CM | POA: Diagnosis not present

## 2017-01-21 DIAGNOSIS — M869 Osteomyelitis, unspecified: Secondary | ICD-10-CM | POA: Diagnosis present

## 2017-01-21 DIAGNOSIS — E11621 Type 2 diabetes mellitus with foot ulcer: Secondary | ICD-10-CM | POA: Diagnosis not present

## 2017-01-21 HISTORY — DX: Headache: R51

## 2017-01-21 HISTORY — DX: Headache, unspecified: R51.9

## 2017-01-21 HISTORY — PX: IRRIGATION AND DEBRIDEMENT FOOT: SHX6602

## 2017-01-21 HISTORY — PX: AMPUTATION TOE: SHX6595

## 2017-01-21 HISTORY — DX: Type 2 diabetes mellitus with other skin complications: L08.9

## 2017-01-21 HISTORY — DX: Local infection of the skin and subcutaneous tissue, unspecified: E11.628

## 2017-01-21 LAB — CBC
HCT: 36.6 % — ABNORMAL LOW (ref 39.0–52.0)
Hemoglobin: 11.7 g/dL — ABNORMAL LOW (ref 13.0–17.0)
MCH: 30.5 pg (ref 26.0–34.0)
MCHC: 32 g/dL (ref 30.0–36.0)
MCV: 95.6 fL (ref 78.0–100.0)
Platelets: 260 10*3/uL (ref 150–400)
RBC: 3.83 MIL/uL — ABNORMAL LOW (ref 4.22–5.81)
RDW: 14.5 % (ref 11.5–15.5)
WBC: 7.5 10*3/uL (ref 4.0–10.5)

## 2017-01-21 LAB — BASIC METABOLIC PANEL
Anion gap: 13 (ref 5–15)
BUN: 42 mg/dL — ABNORMAL HIGH (ref 6–20)
CALCIUM: 10.4 mg/dL — AB (ref 8.9–10.3)
CO2: 29 mmol/L (ref 22–32)
CREATININE: 11.03 mg/dL — AB (ref 0.61–1.24)
Chloride: 97 mmol/L — ABNORMAL LOW (ref 101–111)
GFR calc non Af Amer: 4 mL/min — ABNORMAL LOW (ref >60)
GFR, EST AFRICAN AMERICAN: 5 mL/min — AB (ref >60)
Glucose, Bld: 99 mg/dL (ref 65–99)
Potassium: 5 mmol/L (ref 3.5–5.1)
SODIUM: 139 mmol/L (ref 135–145)

## 2017-01-21 LAB — GLUCOSE, CAPILLARY
GLUCOSE-CAPILLARY: 135 mg/dL — AB (ref 65–99)
GLUCOSE-CAPILLARY: 85 mg/dL (ref 65–99)

## 2017-01-21 SURGERY — AMPUTATION, TOE
Anesthesia: Regional | Site: Foot | Laterality: Right

## 2017-01-21 MED ORDER — METOCLOPRAMIDE HCL 5 MG/ML IJ SOLN: 10.0000 mg | Freq: Once | INTRAMUSCULAR | Status: DC | PRN

## 2017-01-21 MED ORDER — LACTATED RINGERS IV SOLN: INTRAVENOUS | Status: DC

## 2017-01-21 MED ORDER — 0.9 % SODIUM CHLORIDE (POUR BTL) OPTIME
TOPICAL | Status: DC | PRN
  Administered 2017-01-21: 1000 mL

## 2017-01-21 MED ORDER — SODIUM CHLORIDE 0.9 % IV SOLN
INTRAVENOUS | Status: DC
  Administered 2017-01-21: 09:00:00 via INTRAVENOUS

## 2017-01-21 MED ORDER — CHLORHEXIDINE GLUCONATE 4 % EX LIQD: 60.0000 mL | Freq: Once | CUTANEOUS | Status: DC

## 2017-01-21 MED ORDER — HYDROCODONE-ACETAMINOPHEN 5-325 MG PO TABS: 1.0000 | ORAL_TABLET | Freq: Four times a day (QID) | ORAL | 0 refills | Status: DC | PRN

## 2017-01-21 MED ORDER — PHENYLEPHRINE HCL 10 MG/ML IJ SOLN
INTRAMUSCULAR | Status: DC | PRN
  Administered 2017-01-21 (×2): 80 ug via INTRAVENOUS

## 2017-01-21 MED ORDER — PROPOFOL 1000 MG/100ML IV EMUL
INTRAVENOUS | Status: AC
  Filled 2017-01-21: qty 100

## 2017-01-21 MED ORDER — FENTANYL CITRATE (PF) 100 MCG/2ML IJ SOLN
INTRAMUSCULAR | Status: DC | PRN
  Administered 2017-01-21 (×2): 50 ug via INTRAVENOUS

## 2017-01-21 MED ORDER — FENTANYL CITRATE (PF) 100 MCG/2ML IJ SOLN
INTRAMUSCULAR | Status: AC
  Filled 2017-01-21: qty 2

## 2017-01-21 MED ORDER — MIDAZOLAM HCL 2 MG/2ML IJ SOLN
INTRAMUSCULAR | Status: AC
  Administered 2017-01-21: 2 mg via INTRAVENOUS
  Filled 2017-01-21: qty 2

## 2017-01-21 MED ORDER — PROPOFOL 500 MG/50ML IV EMUL
INTRAVENOUS | Status: DC | PRN
  Administered 2017-01-21: 100 ug/kg/min via INTRAVENOUS

## 2017-01-21 MED ORDER — BUPIVACAINE HCL (PF) 0.5 % IJ SOLN
INTRAMUSCULAR | Status: DC | PRN
  Administered 2017-01-21: 30 mL via PERINEURAL

## 2017-01-21 MED ORDER — MIDAZOLAM HCL 2 MG/2ML IJ SOLN
INTRAMUSCULAR | Status: AC
  Filled 2017-01-21: qty 2

## 2017-01-21 MED ORDER — LIDOCAINE HCL (CARDIAC) 20 MG/ML IV SOLN
INTRAVENOUS | Status: DC | PRN
  Administered 2017-01-21: 40 mg via INTRAVENOUS

## 2017-01-21 MED ORDER — SODIUM CHLORIDE 0.9 % IV SOLN
INTRAVENOUS | Status: DC | PRN
  Administered 2017-01-21: 11:00:00 via INTRAVENOUS

## 2017-01-21 MED ORDER — PROPOFOL 10 MG/ML IV BOLUS
INTRAVENOUS | Status: AC
  Filled 2017-01-21: qty 20

## 2017-01-21 MED ORDER — VANCOMYCIN HCL IN DEXTROSE 1-5 GM/200ML-% IV SOLN
INTRAVENOUS | Status: AC
  Filled 2017-01-21: qty 200

## 2017-01-21 MED ORDER — FENTANYL CITRATE (PF) 100 MCG/2ML IJ SOLN: 25.0000 ug | INTRAMUSCULAR | Status: DC | PRN

## 2017-01-21 MED ORDER — VANCOMYCIN HCL IN DEXTROSE 1-5 GM/200ML-% IV SOLN
1000.0000 mg | INTRAVENOUS | Status: AC
  Administered 2017-01-21: 1000 mg via INTRAVENOUS

## 2017-01-21 MED ORDER — MEPERIDINE HCL 25 MG/ML IJ SOLN: 6.2500 mg | INTRAMUSCULAR | Status: DC | PRN

## 2017-01-21 MED ORDER — MIDAZOLAM HCL 2 MG/2ML IJ SOLN
2.0000 mg | Freq: Once | INTRAMUSCULAR | Status: AC
  Administered 2017-01-21: 2 mg via INTRAVENOUS
  Filled 2017-01-21: qty 2

## 2017-01-21 MED ORDER — MIDAZOLAM HCL 5 MG/5ML IJ SOLN
INTRAMUSCULAR | Status: DC | PRN
  Administered 2017-01-21: 2 mg via INTRAVENOUS

## 2017-01-21 SURGICAL SUPPLY — 54 items
BANDAGE ACE 4X5 VEL STRL LF (GAUZE/BANDAGES/DRESSINGS) IMPLANT
BANDAGE ELASTIC 3 VELCRO ST LF (GAUZE/BANDAGES/DRESSINGS) IMPLANT
BANDAGE ELASTIC 4 VELCRO ST LF (GAUZE/BANDAGES/DRESSINGS) ×8 IMPLANT
BLADE OSCILLATING /SAGITTAL (BLADE) ×4 IMPLANT
BNDG COHESIVE 1X5 TAN STRL LF (GAUZE/BANDAGES/DRESSINGS) IMPLANT
BNDG CONFORM 2 STRL LF (GAUZE/BANDAGES/DRESSINGS) IMPLANT
BNDG ESMARK 4X9 LF (GAUZE/BANDAGES/DRESSINGS) ×4 IMPLANT
BNDG GAUZE ELAST 4 BULKY (GAUZE/BANDAGES/DRESSINGS) ×12 IMPLANT
COVER SURGICAL LIGHT HANDLE (MISCELLANEOUS) ×4 IMPLANT
CUFF TOURNIQUET SINGLE 18IN (TOURNIQUET CUFF) ×4 IMPLANT
CUFF TOURNIQUET SINGLE 24IN (TOURNIQUET CUFF) IMPLANT
CUFF TOURNIQUET SINGLE 34IN LL (TOURNIQUET CUFF) IMPLANT
CUFF TOURNIQUET SINGLE 44IN (TOURNIQUET CUFF) IMPLANT
DRAPE EXTREMITY BILATERAL (DRAPES) ×4 IMPLANT
DRAPE IMP U-DRAPE 54X76 (DRAPES) ×4 IMPLANT
DRAPE U-SHAPE 47X51 STRL (DRAPES) ×12 IMPLANT
DRSG ADAPTIC 3X8 NADH LF (GAUZE/BANDAGES/DRESSINGS) ×8 IMPLANT
DRSG PAD ABDOMINAL 8X10 ST (GAUZE/BANDAGES/DRESSINGS) ×8 IMPLANT
ELECT CAUTERY BLADE 6.4 (BLADE) ×4 IMPLANT
GAUZE SPONGE 2X2 8PLY STRL LF (GAUZE/BANDAGES/DRESSINGS) IMPLANT
GAUZE SPONGE 4X4 12PLY STRL (GAUZE/BANDAGES/DRESSINGS) IMPLANT
GLOVE BIOGEL PI IND STRL 8 (GLOVE) ×4 IMPLANT
GLOVE BIOGEL PI INDICATOR 8 (GLOVE) ×4
GLOVE ECLIPSE 7.5 STRL STRAW (GLOVE) ×8 IMPLANT
GOWN STRL REUS W/ TWL LRG LVL3 (GOWN DISPOSABLE) ×4 IMPLANT
GOWN STRL REUS W/ TWL XL LVL3 (GOWN DISPOSABLE) ×4 IMPLANT
GOWN STRL REUS W/TWL LRG LVL3 (GOWN DISPOSABLE) ×4
GOWN STRL REUS W/TWL XL LVL3 (GOWN DISPOSABLE) ×4
KIT BASIN OR (CUSTOM PROCEDURE TRAY) ×4 IMPLANT
KIT ROOM TURNOVER OR (KITS) ×4 IMPLANT
MANIFOLD NEPTUNE II (INSTRUMENTS) ×4 IMPLANT
NS IRRIG 1000ML POUR BTL (IV SOLUTION) ×4 IMPLANT
PACK ORTHO EXTREMITY (CUSTOM PROCEDURE TRAY) ×4 IMPLANT
PAD ARMBOARD 7.5X6 YLW CONV (MISCELLANEOUS) ×8 IMPLANT
PAD CAST 4YDX4 CTTN HI CHSV (CAST SUPPLIES) IMPLANT
PADDING CAST COTTON 4X4 STRL (CAST SUPPLIES)
SOLUTION BETADINE 4OZ (MISCELLANEOUS) IMPLANT
SPECIMEN JAR SMALL (MISCELLANEOUS) IMPLANT
SPONGE GAUZE 2X2 STER 10/PKG (GAUZE/BANDAGES/DRESSINGS)
SPONGE GAUZE 4X4 12PLY STER LF (GAUZE/BANDAGES/DRESSINGS) ×8 IMPLANT
SPONGE LAP 18X18 X RAY DECT (DISPOSABLE) ×4 IMPLANT
SPONGE SCRUB IODOPHOR (GAUZE/BANDAGES/DRESSINGS) ×4 IMPLANT
STOCKINETTE 6  STRL (DRAPES) ×2
STOCKINETTE 6 STRL (DRAPES) ×2 IMPLANT
SUCTION FRAZIER HANDLE 10FR (MISCELLANEOUS)
SUCTION TUBE FRAZIER 10FR DISP (MISCELLANEOUS) IMPLANT
SUT ETHILON 2 0 FS 18 (SUTURE) ×8 IMPLANT
SUT ETHILON 4 0 PS 2 18 (SUTURE) IMPLANT
TOWEL OR 17X24 6PK STRL BLUE (TOWEL DISPOSABLE) ×4 IMPLANT
TOWEL OR 17X26 10 PK STRL BLUE (TOWEL DISPOSABLE) ×4 IMPLANT
TUBE CONNECTING 12'X1/4 (SUCTIONS)
TUBE CONNECTING 12X1/4 (SUCTIONS) IMPLANT
UNDERPAD 30X30 (UNDERPADS AND DIAPERS) ×8 IMPLANT
WATER STERILE IRR 1000ML POUR (IV SOLUTION) ×4 IMPLANT

## 2017-01-21 NOTE — Progress Notes (Signed)
Orthopedic Tech Progress Note Patient Details:  [name] VI Sr. 23-Jun-1951 [id number]  Ortho Devices Type of Ortho Device: Postop shoe/boot Ortho Device/Splint Location: Applied Post Op Shoe for Right and left Foot.  Pt Soild post op shoe and additional post op shoe was provided. Nurse and doctor at bedside.  Family at bedside.  (right and left Foot) Ortho Device/Splint Interventions: Application   Kristopher Oppenheim 01/21/2017, 1:36 PM

## 2017-01-21 NOTE — Anesthesia Postprocedure Evaluation (Signed)
Anesthesia Post Note  Patient: BURLEIGH BROCKMANN Sr.  Procedure(s) Performed: Procedure(s) (LRB): AMPUTATION 2ND TOE RIGHT FOOT  (Right) DEBRIDEMENT LEFT PLANTAR WOUND (Left)  Patient location during evaluation: PACU Anesthesia Type: Regional Level of consciousness: awake and alert Pain management: pain level controlled Vital Signs Assessment: post-procedure vital signs reviewed and stable Respiratory status: spontaneous breathing, nonlabored ventilation, respiratory function stable and patient connected to nasal cannula oxygen Cardiovascular status: blood pressure returned to baseline and stable Postop Assessment: no signs of nausea or vomiting Anesthetic complications: no       Last Vitals:  Vitals:   01/21/17 1232 01/21/17 1237  BP: (!) 181/84   Pulse: 79   Resp: 17   Temp:  36.6 C    Last Pain:  Vitals:   01/21/17 1237  TempSrc:   PainSc: 0-No pain                 Montez Hageman

## 2017-01-21 NOTE — Transfer of Care (Signed)
Immediate Anesthesia Transfer of Care Note  Patient: [name].  Procedure(s) Performed: Procedure(s): AMPUTATION 2ND TOE RIGHT FOOT  (Right) DEBRIDEMENT LEFT PLANTAR WOUND (Left)  Patient Location: PACU  Anesthesia Type:MAC  Level of Consciousness: awake  Airway & Oxygen Therapy: Patient Spontanous Breathing and Patient connected to nasal cannula oxygen  Post-op Assessment: Report given to RN and Post -op Vital signs reviewed and stable  Post vital signs: Reviewed and stable  Last Vitals:  Vitals:   01/21/17 0952 01/21/17 0954  BP:  (!) 164/73  Pulse: 76 74  Resp: 13 12  Temp:      Last Pain:  Vitals:   01/21/17 0813  TempSrc: Oral      Patients Stated Pain Goal: 3 (83/09/40 7680)  Complications: No apparent anesthesia complications

## 2017-01-21 NOTE — Discharge Instructions (Signed)
Elevate your feet as much as possible Call your dialysis center this afternoon to schedule dialysis tomorrow.

## 2017-01-21 NOTE — Anesthesia Procedure Notes (Signed)
Anesthesia Regional Block:  Ankle block  Pre-Anesthetic Checklist: ,, timeout performed, Correct Patient, Correct Site, Correct Laterality, Correct Procedure, Correct Position, site marked, Risks and benefits discussed,  Surgical consent,  Pre-op evaluation,  At surgeon's request and post-op pain management  Laterality: Right and Lower  Prep: Maximum Sterile Barrier Precautions used, chloraprep       Needles:  Injection technique: Single-shot  Needle Type: Echogenic Stimulator Needle     Needle Length: 10cm 10 cm Needle Gauge: 21 G    Additional Needles:  Procedures: ultrasound guided (picture in chart) Ankle block Narrative:  Start time: 01/21/2017 9:30 AM End time: 01/21/2017 9:40 AM Injection made incrementally with aspirations every 5 mL.  Performed by: Personally  Anesthesiologist: Montez Hageman  Additional Notes: Risks, benefits and alternative to block explained extensively.  Patient tolerated procedure well, without complications.

## 2017-01-21 NOTE — Anesthesia Preprocedure Evaluation (Signed)
Anesthesia Evaluation  Patient identified by MRN, date of birth, ID band Patient awake    Reviewed: Allergy & Precautions, H&P , NPO status , Patient's Chart, lab work & pertinent test results, reviewed documented beta blocker date and time   History of Anesthesia Complications Negative for: history of anesthetic complications  Airway Mallampati: II  TM Distance: >3 FB Neck ROM: Full    Dental  (+) Dental Advisory Given, Missing, Poor Dentition,    Pulmonary former smoker,    breath sounds clear to auscultation       Cardiovascular hypertension, Pt. on medications and Pt. on home beta blockers +CHF   Rhythm:Regular  Cath 11/26/14 Normal coronaries, non ischemic cardiomyopathy, EF markedly improved from ECHO 11/23/14 which showed EF 20f 20-25%.  EKG L BBB   Neuro/Psych negative neurological ROS  negative psych ROS   GI/Hepatic negative GI ROS, Neg liver ROS,   Endo/Other  diabetes, Poorly Controlled, Type 2, Insulin Dependent  Renal/GU Dialysis and ESRFRenal diseaseM W F dialysis     Musculoskeletal   Abdominal (+)  Abdomen: soft.    Peds  Hematology  (+) anemia , Improved at 7/23.  Has received PRBC   Anesthesia Other Findings   Reproductive/Obstetrics negative OB ROS                             Anesthesia Physical  Anesthesia Plan  ASA: III  Anesthesia Plan: Regional   Post-op Pain Management:    Induction:   Airway Management Planned:   Additional Equipment:   Intra-op Plan:   Post-operative Plan:   Informed Consent: I have reviewed the patients History and Physical, chart, labs and discussed the procedure including the risks, benefits and alternatives for the proposed anesthesia with the patient or authorized representative who has indicated his/her understanding and acceptance.     Plan Discussed with:   Anesthesia Plan Comments: (Ankle block)         Anesthesia Quick Evaluation

## 2017-01-22 ENCOUNTER — Encounter (HOSPITAL_COMMUNITY): Payer: Self-pay | Admitting: Orthopedic Surgery

## 2017-01-22 DIAGNOSIS — S91309A Unspecified open wound, unspecified foot, initial encounter: Secondary | ICD-10-CM | POA: Diagnosis not present

## 2017-01-22 DIAGNOSIS — N186 End stage renal disease: Secondary | ICD-10-CM | POA: Diagnosis not present

## 2017-01-22 DIAGNOSIS — N2581 Secondary hyperparathyroidism of renal origin: Secondary | ICD-10-CM | POA: Diagnosis not present

## 2017-01-22 DIAGNOSIS — E1121 Type 2 diabetes mellitus with diabetic nephropathy: Secondary | ICD-10-CM | POA: Diagnosis not present

## 2017-01-22 LAB — HEMOGLOBIN A1C
Hgb A1c MFr Bld: 7.2 % — ABNORMAL HIGH (ref 4.8–5.6)
Mean Plasma Glucose: 160 mg/dL

## 2017-01-24 DIAGNOSIS — N2581 Secondary hyperparathyroidism of renal origin: Secondary | ICD-10-CM | POA: Diagnosis not present

## 2017-01-24 DIAGNOSIS — S91309A Unspecified open wound, unspecified foot, initial encounter: Secondary | ICD-10-CM | POA: Diagnosis not present

## 2017-01-24 DIAGNOSIS — N186 End stage renal disease: Secondary | ICD-10-CM | POA: Diagnosis not present

## 2017-01-24 DIAGNOSIS — E1121 Type 2 diabetes mellitus with diabetic nephropathy: Secondary | ICD-10-CM | POA: Diagnosis not present

## 2017-01-25 DIAGNOSIS — M86371 Chronic multifocal osteomyelitis, right ankle and foot: Secondary | ICD-10-CM | POA: Diagnosis not present

## 2017-01-25 DIAGNOSIS — E1169 Type 2 diabetes mellitus with other specified complication: Secondary | ICD-10-CM | POA: Diagnosis not present

## 2017-01-25 DIAGNOSIS — L97525 Non-pressure chronic ulcer of other part of left foot with muscle involvement without evidence of necrosis: Secondary | ICD-10-CM | POA: Diagnosis not present

## 2017-01-25 DIAGNOSIS — L97422 Non-pressure chronic ulcer of left heel and midfoot with fat layer exposed: Secondary | ICD-10-CM | POA: Diagnosis not present

## 2017-01-25 DIAGNOSIS — E11621 Type 2 diabetes mellitus with foot ulcer: Secondary | ICD-10-CM | POA: Diagnosis not present

## 2017-01-25 DIAGNOSIS — L97516 Non-pressure chronic ulcer of other part of right foot with bone involvement without evidence of necrosis: Secondary | ICD-10-CM | POA: Diagnosis not present

## 2017-01-25 DIAGNOSIS — Z992 Dependence on renal dialysis: Secondary | ICD-10-CM | POA: Diagnosis not present

## 2017-01-25 LAB — GLUCOSE, CAPILLARY
GLUCOSE-CAPILLARY: 131 mg/dL — AB (ref 65–99)
GLUCOSE-CAPILLARY: 117 mg/dL — AB (ref 65–99)

## 2017-01-26 DIAGNOSIS — E1122 Type 2 diabetes mellitus with diabetic chronic kidney disease: Secondary | ICD-10-CM | POA: Diagnosis not present

## 2017-01-26 DIAGNOSIS — E1121 Type 2 diabetes mellitus with diabetic nephropathy: Secondary | ICD-10-CM | POA: Diagnosis not present

## 2017-01-26 DIAGNOSIS — M8668 Other chronic osteomyelitis, other site: Secondary | ICD-10-CM | POA: Diagnosis not present

## 2017-01-26 DIAGNOSIS — L97516 Non-pressure chronic ulcer of other part of right foot with bone involvement without evidence of necrosis: Secondary | ICD-10-CM | POA: Diagnosis not present

## 2017-01-26 DIAGNOSIS — E11621 Type 2 diabetes mellitus with foot ulcer: Secondary | ICD-10-CM | POA: Diagnosis not present

## 2017-01-26 DIAGNOSIS — S91309A Unspecified open wound, unspecified foot, initial encounter: Secondary | ICD-10-CM | POA: Diagnosis not present

## 2017-01-26 DIAGNOSIS — N186 End stage renal disease: Secondary | ICD-10-CM | POA: Diagnosis not present

## 2017-01-26 DIAGNOSIS — Z992 Dependence on renal dialysis: Secondary | ICD-10-CM | POA: Diagnosis not present

## 2017-01-26 DIAGNOSIS — E1169 Type 2 diabetes mellitus with other specified complication: Secondary | ICD-10-CM | POA: Diagnosis not present

## 2017-01-26 DIAGNOSIS — L97422 Non-pressure chronic ulcer of left heel and midfoot with fat layer exposed: Secondary | ICD-10-CM | POA: Diagnosis not present

## 2017-01-26 DIAGNOSIS — N2581 Secondary hyperparathyroidism of renal origin: Secondary | ICD-10-CM | POA: Diagnosis not present

## 2017-01-26 DIAGNOSIS — M86371 Chronic multifocal osteomyelitis, right ankle and foot: Secondary | ICD-10-CM | POA: Diagnosis not present

## 2017-01-26 LAB — GLUCOSE, CAPILLARY
GLUCOSE-CAPILLARY: 166 mg/dL — AB (ref 65–99)
GLUCOSE-CAPILLARY: 186 mg/dL — AB (ref 65–99)

## 2017-01-26 NOTE — Op Note (Signed)
NAME:  BRANDUN, PINN NO.:  [id number]  MEDICAL RECORD NO.:  [id number]  LOCATION:                               FACILITY:  [facility]  PHYSICIAN:  [name], M.D.   DATE OF BIRTH:  11-26-1951  DATE OF PROCEDURE:  01/21/2017 DATE OF DISCHARGE:                              OPERATIVE REPORT   IDENTIFICATION:  He is a 66 year old male in the Orthopedic Surgery Service.  PREOPERATIVE DIAGNOSES: 1. Severe osteomyelitis of the right second toe. 2. Complex wound with severe callus deformity and diseased tissue on     the plantar aspect of the left foot.  POSTOPERATIVE DIAGNOSES: 1. Severe osteomyelitis of the right second toe. 2. Complex wound with severe callus deformity and diseased tissue on     the plantar aspect of the left foot. 3. Grossly infected distal phalanx of the third toe of right foot. 4. Multiple severely overgrown toenails on all toes.  SURGEON:  [name], MD.  ASSISTANT:  Gary Fleet, PA.  ANESTHESIA:  General.  PRINCIPAL PROCEDURES PERFORMED: 1. Right second toe amputation at the metatarsophalangeal joint. 2. Amputation of the third distal phalanx through the mid portion of     the phalanx. 3. 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 4. Debridement of hypertrophic nails on all remaining toes, which were     7.  BRIEF HISTORY:  Mr. Kluth is a 66 year old male with a long history of complaints of osteomyelitis of bilateral feet at varying times.  He had been treated with previous amputations.  He had developed severe osteomyelitis of the right second toe.  We had evaluated in the office and set him up for surgical intervention.  It had been some time as we were seeking medical clearance, and ultimately, he was brought to the operating room for this procedure.  We talked preoperatively about addressing his left foot plantar wound because of  the severe chronic nature of the callus formation and the small central open wound, and this was something we were planning on doing.  He was brought to the operating room for these procedures.  PROCEDURE:  The patient was brought to the operating room.  After adequate anesthesia was obtained with general anesthetic, the patient was placed supine on the operating table.  Both feet were then prepped and draped in the usual sterile fashion.  Following this, the right foot was exsanguinated.  Blood pressure tourniquet was inflated to 250 mmHg.  Following this, an incision was made for MP resection of the toe, and this was taken out with the resection of extensor and flexor tendons and debridement of the remaining base.  Nice tissue was left for wound closure.  At this point, now that we could see the third toe well, the second toe was taken off it some.  It was clear that there was some pus that was already starting to form under the distal phalanx of the third toe.  At that point, I felt like it would be not smart not do a distal phalanx amputation just based on  the fact that he already had pus under a flap of skin on this area.  At this point, a racquet-style incision was made over the middle phalanx of the third toe of right foot, and this amputation was performed through the middle phalanx.  The wound was irrigated and closed with interrupted stitches.  Attention then turned to the left foot, where an excisional debridement of skin, subcutaneous tissue, muscle, and fascia, were done with a scalpel blade, leaving a fairly significant hole in the bottom of his foot that hopefully will heal with time and antibiotic therapy.  At this point, we were looking at his feet.  There were severely hypertrophic nails on all remaining toes.  At this point, we did debridement of his toenails with a bone cutter.  At this point, the left foot was irrigated thoroughly, suctioned dry, and closed with a  sterile compressive dressing.  The right foot had all bleeding controlled with electrocautery.  A sterile compressive dressing was applied over the 2 amputation sites.  At this point, the patient was taken to recovery and was noted to be in a satisfactory condition, with the estimated blood loss for the procedure being minimal.     [name], M.D.   ______________________________ [name], M.D.    Corliss Skains  D:  01/25/2017  T:  01/26/2017  Job:  [id number]

## 2017-01-26 NOTE — Op Note (Deleted)
  The note originally documented on this encounter has been moved the the encounter in which it belongs.  

## 2017-01-27 ENCOUNTER — Encounter (HOSPITAL_BASED_OUTPATIENT_CLINIC_OR_DEPARTMENT_OTHER): Payer: Medicare Other | Attending: Internal Medicine

## 2017-01-27 DIAGNOSIS — N186 End stage renal disease: Secondary | ICD-10-CM | POA: Insufficient documentation

## 2017-01-27 DIAGNOSIS — M86672 Other chronic osteomyelitis, left ankle and foot: Secondary | ICD-10-CM | POA: Insufficient documentation

## 2017-01-27 DIAGNOSIS — Z89421 Acquired absence of other right toe(s): Secondary | ICD-10-CM | POA: Diagnosis not present

## 2017-01-27 DIAGNOSIS — L97522 Non-pressure chronic ulcer of other part of left foot with fat layer exposed: Secondary | ICD-10-CM | POA: Insufficient documentation

## 2017-01-27 DIAGNOSIS — M8668 Other chronic osteomyelitis, other site: Secondary | ICD-10-CM | POA: Diagnosis not present

## 2017-01-27 DIAGNOSIS — I10 Essential (primary) hypertension: Secondary | ICD-10-CM | POA: Diagnosis not present

## 2017-01-27 DIAGNOSIS — E1169 Type 2 diabetes mellitus with other specified complication: Secondary | ICD-10-CM | POA: Diagnosis not present

## 2017-01-27 DIAGNOSIS — E1122 Type 2 diabetes mellitus with diabetic chronic kidney disease: Secondary | ICD-10-CM | POA: Insufficient documentation

## 2017-01-27 DIAGNOSIS — E1140 Type 2 diabetes mellitus with diabetic neuropathy, unspecified: Secondary | ICD-10-CM | POA: Diagnosis not present

## 2017-01-27 DIAGNOSIS — I120 Hypertensive chronic kidney disease with stage 5 chronic kidney disease or end stage renal disease: Secondary | ICD-10-CM | POA: Insufficient documentation

## 2017-01-27 DIAGNOSIS — Z992 Dependence on renal dialysis: Secondary | ICD-10-CM | POA: Insufficient documentation

## 2017-01-27 DIAGNOSIS — E11621 Type 2 diabetes mellitus with foot ulcer: Secondary | ICD-10-CM | POA: Diagnosis not present

## 2017-01-27 LAB — GLUCOSE, CAPILLARY
Glucose-Capillary: 217 mg/dL — ABNORMAL HIGH (ref 65–99)
Glucose-Capillary: 210 mg/dL — ABNORMAL HIGH (ref 65–99)

## 2017-01-28 DIAGNOSIS — S91309A Unspecified open wound, unspecified foot, initial encounter: Secondary | ICD-10-CM | POA: Diagnosis not present

## 2017-01-28 DIAGNOSIS — N186 End stage renal disease: Secondary | ICD-10-CM | POA: Diagnosis not present

## 2017-01-28 DIAGNOSIS — L97522 Non-pressure chronic ulcer of other part of left foot with fat layer exposed: Secondary | ICD-10-CM | POA: Diagnosis not present

## 2017-01-28 DIAGNOSIS — E1121 Type 2 diabetes mellitus with diabetic nephropathy: Secondary | ICD-10-CM | POA: Diagnosis not present

## 2017-01-28 DIAGNOSIS — N2581 Secondary hyperparathyroidism of renal origin: Secondary | ICD-10-CM | POA: Diagnosis not present

## 2017-01-28 DIAGNOSIS — E1122 Type 2 diabetes mellitus with diabetic chronic kidney disease: Secondary | ICD-10-CM | POA: Diagnosis not present

## 2017-01-28 DIAGNOSIS — M86672 Other chronic osteomyelitis, left ankle and foot: Secondary | ICD-10-CM | POA: Diagnosis not present

## 2017-01-28 DIAGNOSIS — D631 Anemia in chronic kidney disease: Secondary | ICD-10-CM | POA: Diagnosis not present

## 2017-01-28 DIAGNOSIS — R6883 Chills (without fever): Secondary | ICD-10-CM | POA: Diagnosis not present

## 2017-01-28 DIAGNOSIS — I10 Essential (primary) hypertension: Secondary | ICD-10-CM | POA: Diagnosis not present

## 2017-01-28 DIAGNOSIS — E11621 Type 2 diabetes mellitus with foot ulcer: Secondary | ICD-10-CM | POA: Diagnosis not present

## 2017-01-28 DIAGNOSIS — M8668 Other chronic osteomyelitis, other site: Secondary | ICD-10-CM | POA: Diagnosis not present

## 2017-01-28 DIAGNOSIS — E1169 Type 2 diabetes mellitus with other specified complication: Secondary | ICD-10-CM | POA: Diagnosis not present

## 2017-01-28 DIAGNOSIS — R509 Fever, unspecified: Secondary | ICD-10-CM | POA: Diagnosis not present

## 2017-01-28 LAB — GLUCOSE, CAPILLARY
GLUCOSE-CAPILLARY: 140 mg/dL — AB (ref 65–99)
Glucose-Capillary: 160 mg/dL — ABNORMAL HIGH (ref 65–99)

## 2017-01-31 DIAGNOSIS — R509 Fever, unspecified: Secondary | ICD-10-CM | POA: Diagnosis not present

## 2017-01-31 DIAGNOSIS — E1169 Type 2 diabetes mellitus with other specified complication: Secondary | ICD-10-CM | POA: Diagnosis not present

## 2017-01-31 DIAGNOSIS — S91309A Unspecified open wound, unspecified foot, initial encounter: Secondary | ICD-10-CM | POA: Diagnosis not present

## 2017-01-31 DIAGNOSIS — R6883 Chills (without fever): Secondary | ICD-10-CM | POA: Diagnosis not present

## 2017-01-31 DIAGNOSIS — E11621 Type 2 diabetes mellitus with foot ulcer: Secondary | ICD-10-CM | POA: Diagnosis not present

## 2017-01-31 DIAGNOSIS — E1122 Type 2 diabetes mellitus with diabetic chronic kidney disease: Secondary | ICD-10-CM | POA: Diagnosis not present

## 2017-01-31 DIAGNOSIS — M86672 Other chronic osteomyelitis, left ankle and foot: Secondary | ICD-10-CM | POA: Diagnosis not present

## 2017-01-31 DIAGNOSIS — D631 Anemia in chronic kidney disease: Secondary | ICD-10-CM | POA: Diagnosis not present

## 2017-01-31 DIAGNOSIS — N186 End stage renal disease: Secondary | ICD-10-CM | POA: Diagnosis not present

## 2017-01-31 DIAGNOSIS — N2581 Secondary hyperparathyroidism of renal origin: Secondary | ICD-10-CM | POA: Diagnosis not present

## 2017-01-31 DIAGNOSIS — I10 Essential (primary) hypertension: Secondary | ICD-10-CM | POA: Diagnosis not present

## 2017-01-31 DIAGNOSIS — L97522 Non-pressure chronic ulcer of other part of left foot with fat layer exposed: Secondary | ICD-10-CM | POA: Diagnosis not present

## 2017-01-31 DIAGNOSIS — M8668 Other chronic osteomyelitis, other site: Secondary | ICD-10-CM | POA: Diagnosis not present

## 2017-01-31 LAB — GLUCOSE, CAPILLARY
GLUCOSE-CAPILLARY: 146 mg/dL — AB (ref 65–99)
Glucose-Capillary: 151 mg/dL — ABNORMAL HIGH (ref 65–99)

## 2017-02-01 DIAGNOSIS — I10 Essential (primary) hypertension: Secondary | ICD-10-CM | POA: Diagnosis not present

## 2017-02-01 DIAGNOSIS — E11621 Type 2 diabetes mellitus with foot ulcer: Secondary | ICD-10-CM | POA: Diagnosis not present

## 2017-02-01 DIAGNOSIS — E1122 Type 2 diabetes mellitus with diabetic chronic kidney disease: Secondary | ICD-10-CM | POA: Diagnosis not present

## 2017-02-01 DIAGNOSIS — M8668 Other chronic osteomyelitis, other site: Secondary | ICD-10-CM | POA: Diagnosis not present

## 2017-02-01 DIAGNOSIS — E1169 Type 2 diabetes mellitus with other specified complication: Secondary | ICD-10-CM | POA: Diagnosis not present

## 2017-02-01 DIAGNOSIS — M86672 Other chronic osteomyelitis, left ankle and foot: Secondary | ICD-10-CM | POA: Diagnosis not present

## 2017-02-01 DIAGNOSIS — L97522 Non-pressure chronic ulcer of other part of left foot with fat layer exposed: Secondary | ICD-10-CM | POA: Diagnosis not present

## 2017-02-01 LAB — GLUCOSE, CAPILLARY
Glucose-Capillary: 140 mg/dL — ABNORMAL HIGH (ref 65–99)
Glucose-Capillary: 142 mg/dL — ABNORMAL HIGH (ref 65–99)

## 2017-02-02 DIAGNOSIS — D631 Anemia in chronic kidney disease: Secondary | ICD-10-CM | POA: Diagnosis not present

## 2017-02-02 DIAGNOSIS — R6883 Chills (without fever): Secondary | ICD-10-CM | POA: Diagnosis not present

## 2017-02-02 DIAGNOSIS — M8668 Other chronic osteomyelitis, other site: Secondary | ICD-10-CM | POA: Diagnosis not present

## 2017-02-02 DIAGNOSIS — E1122 Type 2 diabetes mellitus with diabetic chronic kidney disease: Secondary | ICD-10-CM | POA: Diagnosis not present

## 2017-02-02 DIAGNOSIS — N186 End stage renal disease: Secondary | ICD-10-CM | POA: Diagnosis not present

## 2017-02-02 DIAGNOSIS — E1169 Type 2 diabetes mellitus with other specified complication: Secondary | ICD-10-CM | POA: Diagnosis not present

## 2017-02-02 DIAGNOSIS — S91309A Unspecified open wound, unspecified foot, initial encounter: Secondary | ICD-10-CM | POA: Diagnosis not present

## 2017-02-02 DIAGNOSIS — L97522 Non-pressure chronic ulcer of other part of left foot with fat layer exposed: Secondary | ICD-10-CM | POA: Diagnosis not present

## 2017-02-02 DIAGNOSIS — I10 Essential (primary) hypertension: Secondary | ICD-10-CM | POA: Diagnosis not present

## 2017-02-02 DIAGNOSIS — R509 Fever, unspecified: Secondary | ICD-10-CM | POA: Diagnosis not present

## 2017-02-02 DIAGNOSIS — M86672 Other chronic osteomyelitis, left ankle and foot: Secondary | ICD-10-CM | POA: Diagnosis not present

## 2017-02-02 DIAGNOSIS — E11621 Type 2 diabetes mellitus with foot ulcer: Secondary | ICD-10-CM | POA: Diagnosis not present

## 2017-02-02 DIAGNOSIS — N2581 Secondary hyperparathyroidism of renal origin: Secondary | ICD-10-CM | POA: Diagnosis not present

## 2017-02-02 LAB — GLUCOSE, CAPILLARY
GLUCOSE-CAPILLARY: 170 mg/dL — AB (ref 65–99)
GLUCOSE-CAPILLARY: 140 mg/dL — AB (ref 65–99)

## 2017-02-03 ENCOUNTER — Encounter: Payer: Self-pay | Admitting: Infectious Diseases

## 2017-02-03 ENCOUNTER — Ambulatory Visit (INDEPENDENT_AMBULATORY_CARE_PROVIDER_SITE_OTHER): Payer: Medicare Other | Admitting: Infectious Diseases

## 2017-02-03 DIAGNOSIS — N186 End stage renal disease: Secondary | ICD-10-CM

## 2017-02-03 DIAGNOSIS — E1122 Type 2 diabetes mellitus with diabetic chronic kidney disease: Secondary | ICD-10-CM | POA: Diagnosis not present

## 2017-02-03 DIAGNOSIS — M86672 Other chronic osteomyelitis, left ankle and foot: Secondary | ICD-10-CM | POA: Diagnosis not present

## 2017-02-03 DIAGNOSIS — M86271 Subacute osteomyelitis, right ankle and foot: Secondary | ICD-10-CM | POA: Diagnosis not present

## 2017-02-03 DIAGNOSIS — I10 Essential (primary) hypertension: Secondary | ICD-10-CM | POA: Diagnosis not present

## 2017-02-03 DIAGNOSIS — M8668 Other chronic osteomyelitis, other site: Secondary | ICD-10-CM | POA: Diagnosis not present

## 2017-02-03 DIAGNOSIS — E11621 Type 2 diabetes mellitus with foot ulcer: Secondary | ICD-10-CM | POA: Diagnosis not present

## 2017-02-03 DIAGNOSIS — Z992 Dependence on renal dialysis: Secondary | ICD-10-CM

## 2017-02-03 DIAGNOSIS — L97522 Non-pressure chronic ulcer of other part of left foot with fat layer exposed: Secondary | ICD-10-CM | POA: Diagnosis not present

## 2017-02-03 DIAGNOSIS — M86671 Other chronic osteomyelitis, right ankle and foot: Secondary | ICD-10-CM | POA: Diagnosis not present

## 2017-02-03 DIAGNOSIS — M869 Osteomyelitis, unspecified: Secondary | ICD-10-CM | POA: Insufficient documentation

## 2017-02-03 DIAGNOSIS — E1169 Type 2 diabetes mellitus with other specified complication: Secondary | ICD-10-CM | POA: Diagnosis not present

## 2017-02-03 LAB — GLUCOSE, CAPILLARY
GLUCOSE-CAPILLARY: 182 mg/dL — AB (ref 65–99)
GLUCOSE-CAPILLARY: 155 mg/dL — AB (ref 65–99)

## 2017-02-03 NOTE — Assessment & Plan Note (Signed)
S/p amputation of 2 and 3rd digits.  Will f/u with surgery on 2-10 Will continue him on anbx .  Will confirm with HD center.

## 2017-02-03 NOTE — Progress Notes (Signed)
   Subjective:    Patient ID: [name] Sages Sr., male    DOB: [birth year], 66 y.o.   MRN: [id number]  HPI 66 yo M with hx of DM2, ESRD, CAD, and ulcers on R first metatarsal head; L 5th and L 1st MT heads. He has had local debridement of callus.  He has previously had R 5th ray-amp.  In March he received a course of vanco/ceftaz at HD for ~ 1 month. He was then transitioned to po doxy.  In July 2017 he recieved Vancomycin due to increasing drainage from his wound. Cx of this was polymicrobial.  He was in hospital 08-2016 and was treated with 6 weeks of vanc/oceftaz for his foot wounds. He was rec to have vascular and surgical evals at that time however pt wished to go home.  He states these anbx healed his wounds.  He has been followed at [facility] and has been getting hyperbaric therapy.  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  His last MRI showed osteo of R foot (Sept 2017).   He saw ortho on 01-03-17 and was recommended to have R 2nd toe amputation. He also has non-healing L foot ulcer.  Today he has had hardening of his skin. Nails have fallen off. His wounds have been packed at [facility].  He called ID in December was and was restarted on vanco/ceftaz (12-10-16).  Plain film L foot 01-04-17 was (-).  States his sugars have been irregular, can't have hyperbaric O2 if his FSG < 130.  Non-smoked.  Believes he had vascular eval > 1 yr ago.   ID visit 1-10 his anbx were continued at HD (ceftaz/vanco)  He was again seen by vascular 1-23 and he has no arterial insufficiency.   Had amputation 1-26 of R 2nd toe, 3rd toe. 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  Today he has continued wounds on his L foot. He has a new wound on the dorsum of his L foot.  He has been getting hyperbaric O2 therapy.   He has surgical f/u on 2-10.   Review of Systems  Constitutional: Negative  for appetite change, chills, fever and unexpected weight change.  Respiratory: Negative for cough and shortness of breath.   Gastrointestinal: Negative for constipation and diarrhea.  Genitourinary: Negative for difficulty urinating.  Skin: Positive for wound.  FSG 150s No problems with fistula.  No pain.     Objective:   Physical Exam  Constitutional: He appears well-developed and well-nourished.  Musculoskeletal:       Feet:             Assessment & Plan:

## 2017-02-03 NOTE — Assessment & Plan Note (Signed)
Cont to f/u with HD Will confirm his anbx with HD center.

## 2017-02-03 NOTE — Assessment & Plan Note (Addendum)
Has had wound care f/u We had long discussion re: viability of his foot.  He will discuss with wound care center regarding topicals he could use.  Await his surgical f/u.  Will call HD center and confirm his anbx.  rtc in 1 month

## 2017-02-04 DIAGNOSIS — R6883 Chills (without fever): Secondary | ICD-10-CM | POA: Diagnosis not present

## 2017-02-04 DIAGNOSIS — N2581 Secondary hyperparathyroidism of renal origin: Secondary | ICD-10-CM | POA: Diagnosis not present

## 2017-02-04 DIAGNOSIS — N186 End stage renal disease: Secondary | ICD-10-CM | POA: Diagnosis not present

## 2017-02-04 DIAGNOSIS — D631 Anemia in chronic kidney disease: Secondary | ICD-10-CM | POA: Diagnosis not present

## 2017-02-04 DIAGNOSIS — R509 Fever, unspecified: Secondary | ICD-10-CM | POA: Diagnosis not present

## 2017-02-04 DIAGNOSIS — S91309A Unspecified open wound, unspecified foot, initial encounter: Secondary | ICD-10-CM | POA: Diagnosis not present

## 2017-02-05 DIAGNOSIS — Z9889 Other specified postprocedural states: Secondary | ICD-10-CM | POA: Diagnosis not present

## 2017-02-07 DIAGNOSIS — N2581 Secondary hyperparathyroidism of renal origin: Secondary | ICD-10-CM | POA: Diagnosis not present

## 2017-02-07 DIAGNOSIS — I10 Essential (primary) hypertension: Secondary | ICD-10-CM | POA: Diagnosis not present

## 2017-02-07 DIAGNOSIS — N186 End stage renal disease: Secondary | ICD-10-CM | POA: Diagnosis not present

## 2017-02-07 DIAGNOSIS — E1169 Type 2 diabetes mellitus with other specified complication: Secondary | ICD-10-CM | POA: Diagnosis not present

## 2017-02-07 DIAGNOSIS — D631 Anemia in chronic kidney disease: Secondary | ICD-10-CM | POA: Diagnosis not present

## 2017-02-07 DIAGNOSIS — R6883 Chills (without fever): Secondary | ICD-10-CM | POA: Diagnosis not present

## 2017-02-07 DIAGNOSIS — E11621 Type 2 diabetes mellitus with foot ulcer: Secondary | ICD-10-CM | POA: Diagnosis not present

## 2017-02-07 DIAGNOSIS — E1122 Type 2 diabetes mellitus with diabetic chronic kidney disease: Secondary | ICD-10-CM | POA: Diagnosis not present

## 2017-02-07 DIAGNOSIS — S91309A Unspecified open wound, unspecified foot, initial encounter: Secondary | ICD-10-CM | POA: Diagnosis not present

## 2017-02-07 DIAGNOSIS — M86672 Other chronic osteomyelitis, left ankle and foot: Secondary | ICD-10-CM | POA: Diagnosis not present

## 2017-02-07 DIAGNOSIS — R509 Fever, unspecified: Secondary | ICD-10-CM | POA: Diagnosis not present

## 2017-02-07 DIAGNOSIS — M8668 Other chronic osteomyelitis, other site: Secondary | ICD-10-CM | POA: Diagnosis not present

## 2017-02-07 DIAGNOSIS — L97522 Non-pressure chronic ulcer of other part of left foot with fat layer exposed: Secondary | ICD-10-CM | POA: Diagnosis not present

## 2017-02-07 LAB — GLUCOSE, CAPILLARY
GLUCOSE-CAPILLARY: 157 mg/dL — AB (ref 65–99)
Glucose-Capillary: 126 mg/dL — ABNORMAL HIGH (ref 65–99)
Glucose-Capillary: 161 mg/dL — ABNORMAL HIGH (ref 65–99)

## 2017-02-08 DIAGNOSIS — L97522 Non-pressure chronic ulcer of other part of left foot with fat layer exposed: Secondary | ICD-10-CM | POA: Diagnosis not present

## 2017-02-08 DIAGNOSIS — N186 End stage renal disease: Secondary | ICD-10-CM | POA: Diagnosis not present

## 2017-02-08 DIAGNOSIS — R6883 Chills (without fever): Secondary | ICD-10-CM | POA: Diagnosis not present

## 2017-02-08 DIAGNOSIS — I10 Essential (primary) hypertension: Secondary | ICD-10-CM | POA: Diagnosis not present

## 2017-02-08 DIAGNOSIS — E1169 Type 2 diabetes mellitus with other specified complication: Secondary | ICD-10-CM | POA: Diagnosis not present

## 2017-02-08 DIAGNOSIS — N2581 Secondary hyperparathyroidism of renal origin: Secondary | ICD-10-CM | POA: Diagnosis not present

## 2017-02-08 DIAGNOSIS — E1122 Type 2 diabetes mellitus with diabetic chronic kidney disease: Secondary | ICD-10-CM | POA: Diagnosis not present

## 2017-02-08 DIAGNOSIS — S91309A Unspecified open wound, unspecified foot, initial encounter: Secondary | ICD-10-CM | POA: Diagnosis not present

## 2017-02-08 DIAGNOSIS — R509 Fever, unspecified: Secondary | ICD-10-CM | POA: Diagnosis not present

## 2017-02-08 DIAGNOSIS — E11621 Type 2 diabetes mellitus with foot ulcer: Secondary | ICD-10-CM | POA: Diagnosis not present

## 2017-02-08 DIAGNOSIS — M86672 Other chronic osteomyelitis, left ankle and foot: Secondary | ICD-10-CM | POA: Diagnosis not present

## 2017-02-08 DIAGNOSIS — D631 Anemia in chronic kidney disease: Secondary | ICD-10-CM | POA: Diagnosis not present

## 2017-02-08 DIAGNOSIS — M8668 Other chronic osteomyelitis, other site: Secondary | ICD-10-CM | POA: Diagnosis not present

## 2017-02-08 LAB — GLUCOSE, CAPILLARY
GLUCOSE-CAPILLARY: 181 mg/dL — AB (ref 65–99)
Glucose-Capillary: 121 mg/dL — ABNORMAL HIGH (ref 65–99)
Glucose-Capillary: 133 mg/dL — ABNORMAL HIGH (ref 65–99)

## 2017-02-09 DIAGNOSIS — M8668 Other chronic osteomyelitis, other site: Secondary | ICD-10-CM | POA: Diagnosis not present

## 2017-02-09 DIAGNOSIS — M86672 Other chronic osteomyelitis, left ankle and foot: Secondary | ICD-10-CM | POA: Diagnosis not present

## 2017-02-09 DIAGNOSIS — L97522 Non-pressure chronic ulcer of other part of left foot with fat layer exposed: Secondary | ICD-10-CM | POA: Diagnosis not present

## 2017-02-09 DIAGNOSIS — E11621 Type 2 diabetes mellitus with foot ulcer: Secondary | ICD-10-CM | POA: Diagnosis not present

## 2017-02-09 DIAGNOSIS — E1169 Type 2 diabetes mellitus with other specified complication: Secondary | ICD-10-CM | POA: Diagnosis not present

## 2017-02-09 DIAGNOSIS — E1122 Type 2 diabetes mellitus with diabetic chronic kidney disease: Secondary | ICD-10-CM | POA: Diagnosis not present

## 2017-02-09 DIAGNOSIS — I10 Essential (primary) hypertension: Secondary | ICD-10-CM | POA: Diagnosis not present

## 2017-02-09 LAB — GLUCOSE, CAPILLARY
GLUCOSE-CAPILLARY: 193 mg/dL — AB (ref 65–99)
GLUCOSE-CAPILLARY: 176 mg/dL — AB (ref 65–99)

## 2017-02-10 DIAGNOSIS — M8668 Other chronic osteomyelitis, other site: Secondary | ICD-10-CM | POA: Diagnosis not present

## 2017-02-10 DIAGNOSIS — M86672 Other chronic osteomyelitis, left ankle and foot: Secondary | ICD-10-CM | POA: Diagnosis not present

## 2017-02-10 DIAGNOSIS — E11621 Type 2 diabetes mellitus with foot ulcer: Secondary | ICD-10-CM | POA: Diagnosis not present

## 2017-02-10 DIAGNOSIS — E1122 Type 2 diabetes mellitus with diabetic chronic kidney disease: Secondary | ICD-10-CM | POA: Diagnosis not present

## 2017-02-10 DIAGNOSIS — L97522 Non-pressure chronic ulcer of other part of left foot with fat layer exposed: Secondary | ICD-10-CM | POA: Diagnosis not present

## 2017-02-10 DIAGNOSIS — I10 Essential (primary) hypertension: Secondary | ICD-10-CM | POA: Diagnosis not present

## 2017-02-10 DIAGNOSIS — E1169 Type 2 diabetes mellitus with other specified complication: Secondary | ICD-10-CM | POA: Diagnosis not present

## 2017-02-10 LAB — GLUCOSE, CAPILLARY
GLUCOSE-CAPILLARY: 149 mg/dL — AB (ref 65–99)
GLUCOSE-CAPILLARY: 128 mg/dL — AB (ref 65–99)

## 2017-02-11 DIAGNOSIS — L97522 Non-pressure chronic ulcer of other part of left foot with fat layer exposed: Secondary | ICD-10-CM | POA: Diagnosis not present

## 2017-02-11 DIAGNOSIS — M86672 Other chronic osteomyelitis, left ankle and foot: Secondary | ICD-10-CM | POA: Diagnosis not present

## 2017-02-11 DIAGNOSIS — I10 Essential (primary) hypertension: Secondary | ICD-10-CM | POA: Diagnosis not present

## 2017-02-11 DIAGNOSIS — N186 End stage renal disease: Secondary | ICD-10-CM | POA: Diagnosis not present

## 2017-02-11 DIAGNOSIS — D631 Anemia in chronic kidney disease: Secondary | ICD-10-CM | POA: Diagnosis not present

## 2017-02-11 DIAGNOSIS — N2581 Secondary hyperparathyroidism of renal origin: Secondary | ICD-10-CM | POA: Diagnosis not present

## 2017-02-11 DIAGNOSIS — E1169 Type 2 diabetes mellitus with other specified complication: Secondary | ICD-10-CM | POA: Diagnosis not present

## 2017-02-11 DIAGNOSIS — E1122 Type 2 diabetes mellitus with diabetic chronic kidney disease: Secondary | ICD-10-CM | POA: Diagnosis not present

## 2017-02-11 DIAGNOSIS — S91309A Unspecified open wound, unspecified foot, initial encounter: Secondary | ICD-10-CM | POA: Diagnosis not present

## 2017-02-11 DIAGNOSIS — R509 Fever, unspecified: Secondary | ICD-10-CM | POA: Diagnosis not present

## 2017-02-11 DIAGNOSIS — R6883 Chills (without fever): Secondary | ICD-10-CM | POA: Diagnosis not present

## 2017-02-11 DIAGNOSIS — E11621 Type 2 diabetes mellitus with foot ulcer: Secondary | ICD-10-CM | POA: Diagnosis not present

## 2017-02-11 DIAGNOSIS — M8668 Other chronic osteomyelitis, other site: Secondary | ICD-10-CM | POA: Diagnosis not present

## 2017-02-11 LAB — GLUCOSE, CAPILLARY
GLUCOSE-CAPILLARY: 154 mg/dL — AB (ref 65–99)
Glucose-Capillary: 157 mg/dL — ABNORMAL HIGH (ref 65–99)

## 2017-02-14 DIAGNOSIS — E1169 Type 2 diabetes mellitus with other specified complication: Secondary | ICD-10-CM | POA: Diagnosis not present

## 2017-02-14 DIAGNOSIS — R6883 Chills (without fever): Secondary | ICD-10-CM | POA: Diagnosis not present

## 2017-02-14 DIAGNOSIS — N2581 Secondary hyperparathyroidism of renal origin: Secondary | ICD-10-CM | POA: Diagnosis not present

## 2017-02-14 DIAGNOSIS — E1122 Type 2 diabetes mellitus with diabetic chronic kidney disease: Secondary | ICD-10-CM | POA: Diagnosis not present

## 2017-02-14 DIAGNOSIS — S91309A Unspecified open wound, unspecified foot, initial encounter: Secondary | ICD-10-CM | POA: Diagnosis not present

## 2017-02-14 DIAGNOSIS — D631 Anemia in chronic kidney disease: Secondary | ICD-10-CM | POA: Diagnosis not present

## 2017-02-14 DIAGNOSIS — E11621 Type 2 diabetes mellitus with foot ulcer: Secondary | ICD-10-CM | POA: Diagnosis not present

## 2017-02-14 DIAGNOSIS — L97522 Non-pressure chronic ulcer of other part of left foot with fat layer exposed: Secondary | ICD-10-CM | POA: Diagnosis not present

## 2017-02-14 DIAGNOSIS — R509 Fever, unspecified: Secondary | ICD-10-CM | POA: Diagnosis not present

## 2017-02-14 DIAGNOSIS — I10 Essential (primary) hypertension: Secondary | ICD-10-CM | POA: Diagnosis not present

## 2017-02-14 DIAGNOSIS — M86672 Other chronic osteomyelitis, left ankle and foot: Secondary | ICD-10-CM | POA: Diagnosis not present

## 2017-02-14 DIAGNOSIS — M8668 Other chronic osteomyelitis, other site: Secondary | ICD-10-CM | POA: Diagnosis not present

## 2017-02-14 DIAGNOSIS — N186 End stage renal disease: Secondary | ICD-10-CM | POA: Diagnosis not present

## 2017-02-14 LAB — GLUCOSE, CAPILLARY
Glucose-Capillary: 169 mg/dL — ABNORMAL HIGH (ref 65–99)
Glucose-Capillary: 150 mg/dL — ABNORMAL HIGH (ref 65–99)

## 2017-02-15 DIAGNOSIS — E11621 Type 2 diabetes mellitus with foot ulcer: Secondary | ICD-10-CM | POA: Diagnosis not present

## 2017-02-15 DIAGNOSIS — I10 Essential (primary) hypertension: Secondary | ICD-10-CM | POA: Diagnosis not present

## 2017-02-15 DIAGNOSIS — E1169 Type 2 diabetes mellitus with other specified complication: Secondary | ICD-10-CM | POA: Diagnosis not present

## 2017-02-15 DIAGNOSIS — M8668 Other chronic osteomyelitis, other site: Secondary | ICD-10-CM | POA: Diagnosis not present

## 2017-02-15 DIAGNOSIS — L97522 Non-pressure chronic ulcer of other part of left foot with fat layer exposed: Secondary | ICD-10-CM | POA: Diagnosis not present

## 2017-02-15 DIAGNOSIS — E1122 Type 2 diabetes mellitus with diabetic chronic kidney disease: Secondary | ICD-10-CM | POA: Diagnosis not present

## 2017-02-15 DIAGNOSIS — M86672 Other chronic osteomyelitis, left ankle and foot: Secondary | ICD-10-CM | POA: Diagnosis not present

## 2017-02-15 LAB — GLUCOSE, CAPILLARY
GLUCOSE-CAPILLARY: 132 mg/dL — AB (ref 65–99)
Glucose-Capillary: 144 mg/dL — ABNORMAL HIGH (ref 65–99)

## 2017-02-16 DIAGNOSIS — D631 Anemia in chronic kidney disease: Secondary | ICD-10-CM | POA: Diagnosis not present

## 2017-02-16 DIAGNOSIS — E1169 Type 2 diabetes mellitus with other specified complication: Secondary | ICD-10-CM | POA: Diagnosis not present

## 2017-02-16 DIAGNOSIS — M86672 Other chronic osteomyelitis, left ankle and foot: Secondary | ICD-10-CM | POA: Diagnosis not present

## 2017-02-16 DIAGNOSIS — R6883 Chills (without fever): Secondary | ICD-10-CM | POA: Diagnosis not present

## 2017-02-16 DIAGNOSIS — E1122 Type 2 diabetes mellitus with diabetic chronic kidney disease: Secondary | ICD-10-CM | POA: Diagnosis not present

## 2017-02-16 DIAGNOSIS — S91309A Unspecified open wound, unspecified foot, initial encounter: Secondary | ICD-10-CM | POA: Diagnosis not present

## 2017-02-16 DIAGNOSIS — M8668 Other chronic osteomyelitis, other site: Secondary | ICD-10-CM | POA: Diagnosis not present

## 2017-02-16 DIAGNOSIS — L97522 Non-pressure chronic ulcer of other part of left foot with fat layer exposed: Secondary | ICD-10-CM | POA: Diagnosis not present

## 2017-02-16 DIAGNOSIS — N186 End stage renal disease: Secondary | ICD-10-CM | POA: Diagnosis not present

## 2017-02-16 DIAGNOSIS — I10 Essential (primary) hypertension: Secondary | ICD-10-CM | POA: Diagnosis not present

## 2017-02-16 DIAGNOSIS — E11621 Type 2 diabetes mellitus with foot ulcer: Secondary | ICD-10-CM | POA: Diagnosis not present

## 2017-02-16 DIAGNOSIS — R509 Fever, unspecified: Secondary | ICD-10-CM | POA: Diagnosis not present

## 2017-02-16 DIAGNOSIS — N2581 Secondary hyperparathyroidism of renal origin: Secondary | ICD-10-CM | POA: Diagnosis not present

## 2017-02-16 LAB — GLUCOSE, CAPILLARY
GLUCOSE-CAPILLARY: 157 mg/dL — AB (ref 65–99)
GLUCOSE-CAPILLARY: 122 mg/dL — AB (ref 65–99)

## 2017-02-17 DIAGNOSIS — E11621 Type 2 diabetes mellitus with foot ulcer: Secondary | ICD-10-CM | POA: Diagnosis not present

## 2017-02-17 DIAGNOSIS — M8668 Other chronic osteomyelitis, other site: Secondary | ICD-10-CM | POA: Diagnosis not present

## 2017-02-17 DIAGNOSIS — I10 Essential (primary) hypertension: Secondary | ICD-10-CM | POA: Diagnosis not present

## 2017-02-17 DIAGNOSIS — E1169 Type 2 diabetes mellitus with other specified complication: Secondary | ICD-10-CM | POA: Diagnosis not present

## 2017-02-17 DIAGNOSIS — L97522 Non-pressure chronic ulcer of other part of left foot with fat layer exposed: Secondary | ICD-10-CM | POA: Diagnosis not present

## 2017-02-17 DIAGNOSIS — M86672 Other chronic osteomyelitis, left ankle and foot: Secondary | ICD-10-CM | POA: Diagnosis not present

## 2017-02-17 DIAGNOSIS — E1122 Type 2 diabetes mellitus with diabetic chronic kidney disease: Secondary | ICD-10-CM | POA: Diagnosis not present

## 2017-02-17 LAB — GLUCOSE, CAPILLARY
Glucose-Capillary: 132 mg/dL — ABNORMAL HIGH (ref 65–99)
Glucose-Capillary: 138 mg/dL — ABNORMAL HIGH (ref 65–99)

## 2017-02-21 DIAGNOSIS — L97522 Non-pressure chronic ulcer of other part of left foot with fat layer exposed: Secondary | ICD-10-CM | POA: Diagnosis not present

## 2017-02-21 DIAGNOSIS — M8668 Other chronic osteomyelitis, other site: Secondary | ICD-10-CM | POA: Diagnosis not present

## 2017-02-21 DIAGNOSIS — M86672 Other chronic osteomyelitis, left ankle and foot: Secondary | ICD-10-CM | POA: Diagnosis not present

## 2017-02-21 DIAGNOSIS — D631 Anemia in chronic kidney disease: Secondary | ICD-10-CM | POA: Diagnosis not present

## 2017-02-21 DIAGNOSIS — E1169 Type 2 diabetes mellitus with other specified complication: Secondary | ICD-10-CM | POA: Diagnosis not present

## 2017-02-21 DIAGNOSIS — E11621 Type 2 diabetes mellitus with foot ulcer: Secondary | ICD-10-CM | POA: Diagnosis not present

## 2017-02-21 DIAGNOSIS — E1122 Type 2 diabetes mellitus with diabetic chronic kidney disease: Secondary | ICD-10-CM | POA: Diagnosis not present

## 2017-02-21 DIAGNOSIS — R6883 Chills (without fever): Secondary | ICD-10-CM | POA: Diagnosis not present

## 2017-02-21 DIAGNOSIS — S91309A Unspecified open wound, unspecified foot, initial encounter: Secondary | ICD-10-CM | POA: Diagnosis not present

## 2017-02-21 DIAGNOSIS — N2581 Secondary hyperparathyroidism of renal origin: Secondary | ICD-10-CM | POA: Diagnosis not present

## 2017-02-21 DIAGNOSIS — N186 End stage renal disease: Secondary | ICD-10-CM | POA: Diagnosis not present

## 2017-02-21 DIAGNOSIS — R509 Fever, unspecified: Secondary | ICD-10-CM | POA: Diagnosis not present

## 2017-02-21 DIAGNOSIS — I10 Essential (primary) hypertension: Secondary | ICD-10-CM | POA: Diagnosis not present

## 2017-02-21 LAB — GLUCOSE, CAPILLARY
GLUCOSE-CAPILLARY: 159 mg/dL — AB (ref 65–99)
GLUCOSE-CAPILLARY: 114 mg/dL — AB (ref 65–99)

## 2017-02-22 DIAGNOSIS — E1169 Type 2 diabetes mellitus with other specified complication: Secondary | ICD-10-CM | POA: Diagnosis not present

## 2017-02-22 DIAGNOSIS — M86672 Other chronic osteomyelitis, left ankle and foot: Secondary | ICD-10-CM | POA: Diagnosis not present

## 2017-02-22 DIAGNOSIS — E11621 Type 2 diabetes mellitus with foot ulcer: Secondary | ICD-10-CM | POA: Diagnosis not present

## 2017-02-22 DIAGNOSIS — I10 Essential (primary) hypertension: Secondary | ICD-10-CM | POA: Diagnosis not present

## 2017-02-22 DIAGNOSIS — E1122 Type 2 diabetes mellitus with diabetic chronic kidney disease: Secondary | ICD-10-CM | POA: Diagnosis not present

## 2017-02-22 DIAGNOSIS — L97522 Non-pressure chronic ulcer of other part of left foot with fat layer exposed: Secondary | ICD-10-CM | POA: Diagnosis not present

## 2017-02-23 DIAGNOSIS — M8668 Other chronic osteomyelitis, other site: Secondary | ICD-10-CM | POA: Diagnosis not present

## 2017-02-23 DIAGNOSIS — Z992 Dependence on renal dialysis: Secondary | ICD-10-CM | POA: Diagnosis not present

## 2017-02-23 DIAGNOSIS — E1122 Type 2 diabetes mellitus with diabetic chronic kidney disease: Secondary | ICD-10-CM | POA: Diagnosis not present

## 2017-02-23 DIAGNOSIS — R6883 Chills (without fever): Secondary | ICD-10-CM | POA: Diagnosis not present

## 2017-02-23 DIAGNOSIS — N186 End stage renal disease: Secondary | ICD-10-CM | POA: Diagnosis not present

## 2017-02-23 DIAGNOSIS — E1169 Type 2 diabetes mellitus with other specified complication: Secondary | ICD-10-CM | POA: Diagnosis not present

## 2017-02-23 DIAGNOSIS — R509 Fever, unspecified: Secondary | ICD-10-CM | POA: Diagnosis not present

## 2017-02-23 DIAGNOSIS — N2581 Secondary hyperparathyroidism of renal origin: Secondary | ICD-10-CM | POA: Diagnosis not present

## 2017-02-23 DIAGNOSIS — M86672 Other chronic osteomyelitis, left ankle and foot: Secondary | ICD-10-CM | POA: Diagnosis not present

## 2017-02-23 DIAGNOSIS — I10 Essential (primary) hypertension: Secondary | ICD-10-CM | POA: Diagnosis not present

## 2017-02-23 DIAGNOSIS — E11621 Type 2 diabetes mellitus with foot ulcer: Secondary | ICD-10-CM | POA: Diagnosis not present

## 2017-02-23 DIAGNOSIS — L97522 Non-pressure chronic ulcer of other part of left foot with fat layer exposed: Secondary | ICD-10-CM | POA: Diagnosis not present

## 2017-02-23 DIAGNOSIS — D631 Anemia in chronic kidney disease: Secondary | ICD-10-CM | POA: Diagnosis not present

## 2017-02-23 DIAGNOSIS — S91309A Unspecified open wound, unspecified foot, initial encounter: Secondary | ICD-10-CM | POA: Diagnosis not present

## 2017-02-23 LAB — GLUCOSE, CAPILLARY
Glucose-Capillary: 141 mg/dL — ABNORMAL HIGH (ref 65–99)
Glucose-Capillary: 140 mg/dL — ABNORMAL HIGH (ref 65–99)

## 2017-02-24 ENCOUNTER — Encounter (HOSPITAL_BASED_OUTPATIENT_CLINIC_OR_DEPARTMENT_OTHER): Payer: Medicare Other | Attending: Internal Medicine

## 2017-02-24 DIAGNOSIS — Z992 Dependence on renal dialysis: Secondary | ICD-10-CM | POA: Diagnosis not present

## 2017-02-24 DIAGNOSIS — M86671 Other chronic osteomyelitis, right ankle and foot: Secondary | ICD-10-CM | POA: Insufficient documentation

## 2017-02-24 DIAGNOSIS — E11621 Type 2 diabetes mellitus with foot ulcer: Secondary | ICD-10-CM | POA: Insufficient documentation

## 2017-02-24 DIAGNOSIS — E11622 Type 2 diabetes mellitus with other skin ulcer: Secondary | ICD-10-CM | POA: Diagnosis not present

## 2017-02-24 DIAGNOSIS — E1169 Type 2 diabetes mellitus with other specified complication: Secondary | ICD-10-CM | POA: Insufficient documentation

## 2017-02-24 DIAGNOSIS — L97522 Non-pressure chronic ulcer of other part of left foot with fat layer exposed: Secondary | ICD-10-CM | POA: Diagnosis not present

## 2017-02-24 DIAGNOSIS — E1140 Type 2 diabetes mellitus with diabetic neuropathy, unspecified: Secondary | ICD-10-CM | POA: Insufficient documentation

## 2017-02-24 DIAGNOSIS — N186 End stage renal disease: Secondary | ICD-10-CM | POA: Diagnosis not present

## 2017-02-24 DIAGNOSIS — M8668 Other chronic osteomyelitis, other site: Secondary | ICD-10-CM | POA: Diagnosis not present

## 2017-02-24 DIAGNOSIS — I120 Hypertensive chronic kidney disease with stage 5 chronic kidney disease or end stage renal disease: Secondary | ICD-10-CM | POA: Insufficient documentation

## 2017-02-24 DIAGNOSIS — L97422 Non-pressure chronic ulcer of left heel and midfoot with fat layer exposed: Secondary | ICD-10-CM | POA: Diagnosis not present

## 2017-02-24 LAB — GLUCOSE, CAPILLARY
GLUCOSE-CAPILLARY: 166 mg/dL — AB (ref 65–99)
Glucose-Capillary: 164 mg/dL — ABNORMAL HIGH (ref 65–99)

## 2017-02-25 DIAGNOSIS — Z992 Dependence on renal dialysis: Secondary | ICD-10-CM | POA: Diagnosis not present

## 2017-02-25 DIAGNOSIS — D631 Anemia in chronic kidney disease: Secondary | ICD-10-CM | POA: Diagnosis not present

## 2017-02-25 DIAGNOSIS — E11622 Type 2 diabetes mellitus with other skin ulcer: Secondary | ICD-10-CM | POA: Diagnosis not present

## 2017-02-25 DIAGNOSIS — M86671 Other chronic osteomyelitis, right ankle and foot: Secondary | ICD-10-CM | POA: Diagnosis not present

## 2017-02-25 DIAGNOSIS — L97522 Non-pressure chronic ulcer of other part of left foot with fat layer exposed: Secondary | ICD-10-CM | POA: Diagnosis not present

## 2017-02-25 DIAGNOSIS — N2581 Secondary hyperparathyroidism of renal origin: Secondary | ICD-10-CM | POA: Diagnosis not present

## 2017-02-25 DIAGNOSIS — E11621 Type 2 diabetes mellitus with foot ulcer: Secondary | ICD-10-CM | POA: Diagnosis not present

## 2017-02-25 DIAGNOSIS — S91309A Unspecified open wound, unspecified foot, initial encounter: Secondary | ICD-10-CM | POA: Diagnosis not present

## 2017-02-25 DIAGNOSIS — M8668 Other chronic osteomyelitis, other site: Secondary | ICD-10-CM | POA: Diagnosis not present

## 2017-02-25 DIAGNOSIS — E1169 Type 2 diabetes mellitus with other specified complication: Secondary | ICD-10-CM | POA: Diagnosis not present

## 2017-02-25 DIAGNOSIS — L97422 Non-pressure chronic ulcer of left heel and midfoot with fat layer exposed: Secondary | ICD-10-CM | POA: Diagnosis not present

## 2017-02-25 DIAGNOSIS — N186 End stage renal disease: Secondary | ICD-10-CM | POA: Diagnosis not present

## 2017-02-25 DIAGNOSIS — E1121 Type 2 diabetes mellitus with diabetic nephropathy: Secondary | ICD-10-CM | POA: Diagnosis not present

## 2017-02-25 LAB — GLUCOSE, CAPILLARY
GLUCOSE-CAPILLARY: 129 mg/dL — AB (ref 65–99)
Glucose-Capillary: 153 mg/dL — ABNORMAL HIGH (ref 65–99)

## 2017-02-28 DIAGNOSIS — Z992 Dependence on renal dialysis: Secondary | ICD-10-CM | POA: Diagnosis not present

## 2017-02-28 DIAGNOSIS — N2581 Secondary hyperparathyroidism of renal origin: Secondary | ICD-10-CM | POA: Diagnosis not present

## 2017-02-28 DIAGNOSIS — E1169 Type 2 diabetes mellitus with other specified complication: Secondary | ICD-10-CM | POA: Diagnosis not present

## 2017-02-28 DIAGNOSIS — E11621 Type 2 diabetes mellitus with foot ulcer: Secondary | ICD-10-CM | POA: Diagnosis not present

## 2017-02-28 DIAGNOSIS — E11622 Type 2 diabetes mellitus with other skin ulcer: Secondary | ICD-10-CM | POA: Diagnosis not present

## 2017-02-28 DIAGNOSIS — L97422 Non-pressure chronic ulcer of left heel and midfoot with fat layer exposed: Secondary | ICD-10-CM | POA: Diagnosis not present

## 2017-02-28 DIAGNOSIS — D631 Anemia in chronic kidney disease: Secondary | ICD-10-CM | POA: Diagnosis not present

## 2017-02-28 DIAGNOSIS — N186 End stage renal disease: Secondary | ICD-10-CM | POA: Diagnosis not present

## 2017-02-28 DIAGNOSIS — L97522 Non-pressure chronic ulcer of other part of left foot with fat layer exposed: Secondary | ICD-10-CM | POA: Diagnosis not present

## 2017-02-28 DIAGNOSIS — E1121 Type 2 diabetes mellitus with diabetic nephropathy: Secondary | ICD-10-CM | POA: Diagnosis not present

## 2017-02-28 DIAGNOSIS — S91309A Unspecified open wound, unspecified foot, initial encounter: Secondary | ICD-10-CM | POA: Diagnosis not present

## 2017-02-28 DIAGNOSIS — M86671 Other chronic osteomyelitis, right ankle and foot: Secondary | ICD-10-CM | POA: Diagnosis not present

## 2017-02-28 DIAGNOSIS — M8668 Other chronic osteomyelitis, other site: Secondary | ICD-10-CM | POA: Diagnosis not present

## 2017-02-28 LAB — GLUCOSE, CAPILLARY
Glucose-Capillary: 151 mg/dL — ABNORMAL HIGH (ref 65–99)
Glucose-Capillary: 138 mg/dL — ABNORMAL HIGH (ref 65–99)

## 2017-03-01 DIAGNOSIS — E1169 Type 2 diabetes mellitus with other specified complication: Secondary | ICD-10-CM | POA: Diagnosis not present

## 2017-03-01 DIAGNOSIS — E11622 Type 2 diabetes mellitus with other skin ulcer: Secondary | ICD-10-CM | POA: Diagnosis not present

## 2017-03-01 DIAGNOSIS — E11621 Type 2 diabetes mellitus with foot ulcer: Secondary | ICD-10-CM | POA: Diagnosis not present

## 2017-03-01 DIAGNOSIS — L97422 Non-pressure chronic ulcer of left heel and midfoot with fat layer exposed: Secondary | ICD-10-CM | POA: Diagnosis not present

## 2017-03-01 DIAGNOSIS — M8668 Other chronic osteomyelitis, other site: Secondary | ICD-10-CM | POA: Diagnosis not present

## 2017-03-01 DIAGNOSIS — L97522 Non-pressure chronic ulcer of other part of left foot with fat layer exposed: Secondary | ICD-10-CM | POA: Diagnosis not present

## 2017-03-01 DIAGNOSIS — M86671 Other chronic osteomyelitis, right ankle and foot: Secondary | ICD-10-CM | POA: Diagnosis not present

## 2017-03-01 LAB — GLUCOSE, CAPILLARY
GLUCOSE-CAPILLARY: 187 mg/dL — AB (ref 65–99)
Glucose-Capillary: 141 mg/dL — ABNORMAL HIGH (ref 65–99)

## 2017-03-02 DIAGNOSIS — E11622 Type 2 diabetes mellitus with other skin ulcer: Secondary | ICD-10-CM | POA: Diagnosis not present

## 2017-03-02 DIAGNOSIS — E1121 Type 2 diabetes mellitus with diabetic nephropathy: Secondary | ICD-10-CM | POA: Diagnosis not present

## 2017-03-02 DIAGNOSIS — E1169 Type 2 diabetes mellitus with other specified complication: Secondary | ICD-10-CM | POA: Diagnosis not present

## 2017-03-02 DIAGNOSIS — N2581 Secondary hyperparathyroidism of renal origin: Secondary | ICD-10-CM | POA: Diagnosis not present

## 2017-03-02 DIAGNOSIS — N186 End stage renal disease: Secondary | ICD-10-CM | POA: Diagnosis not present

## 2017-03-02 DIAGNOSIS — Z992 Dependence on renal dialysis: Secondary | ICD-10-CM | POA: Diagnosis not present

## 2017-03-02 DIAGNOSIS — L97522 Non-pressure chronic ulcer of other part of left foot with fat layer exposed: Secondary | ICD-10-CM | POA: Diagnosis not present

## 2017-03-02 DIAGNOSIS — M8668 Other chronic osteomyelitis, other site: Secondary | ICD-10-CM | POA: Diagnosis not present

## 2017-03-02 DIAGNOSIS — D631 Anemia in chronic kidney disease: Secondary | ICD-10-CM | POA: Diagnosis not present

## 2017-03-02 DIAGNOSIS — M86671 Other chronic osteomyelitis, right ankle and foot: Secondary | ICD-10-CM | POA: Diagnosis not present

## 2017-03-02 DIAGNOSIS — S91309A Unspecified open wound, unspecified foot, initial encounter: Secondary | ICD-10-CM | POA: Diagnosis not present

## 2017-03-02 DIAGNOSIS — L97422 Non-pressure chronic ulcer of left heel and midfoot with fat layer exposed: Secondary | ICD-10-CM | POA: Diagnosis not present

## 2017-03-02 DIAGNOSIS — E11621 Type 2 diabetes mellitus with foot ulcer: Secondary | ICD-10-CM | POA: Diagnosis not present

## 2017-03-02 LAB — GLUCOSE, CAPILLARY
GLUCOSE-CAPILLARY: 143 mg/dL — AB (ref 65–99)
Glucose-Capillary: 142 mg/dL — ABNORMAL HIGH (ref 65–99)

## 2017-03-03 DIAGNOSIS — L97522 Non-pressure chronic ulcer of other part of left foot with fat layer exposed: Secondary | ICD-10-CM | POA: Diagnosis not present

## 2017-03-03 DIAGNOSIS — L97422 Non-pressure chronic ulcer of left heel and midfoot with fat layer exposed: Secondary | ICD-10-CM | POA: Diagnosis not present

## 2017-03-03 DIAGNOSIS — E11621 Type 2 diabetes mellitus with foot ulcer: Secondary | ICD-10-CM | POA: Diagnosis not present

## 2017-03-03 DIAGNOSIS — E11622 Type 2 diabetes mellitus with other skin ulcer: Secondary | ICD-10-CM | POA: Diagnosis not present

## 2017-03-03 DIAGNOSIS — M8668 Other chronic osteomyelitis, other site: Secondary | ICD-10-CM | POA: Diagnosis not present

## 2017-03-03 DIAGNOSIS — E1169 Type 2 diabetes mellitus with other specified complication: Secondary | ICD-10-CM | POA: Diagnosis not present

## 2017-03-03 DIAGNOSIS — M86671 Other chronic osteomyelitis, right ankle and foot: Secondary | ICD-10-CM | POA: Diagnosis not present

## 2017-03-03 LAB — GLUCOSE, CAPILLARY
GLUCOSE-CAPILLARY: 148 mg/dL — AB (ref 65–99)
GLUCOSE-CAPILLARY: 186 mg/dL — AB (ref 65–99)

## 2017-03-04 DIAGNOSIS — Z992 Dependence on renal dialysis: Secondary | ICD-10-CM | POA: Diagnosis not present

## 2017-03-04 DIAGNOSIS — E1121 Type 2 diabetes mellitus with diabetic nephropathy: Secondary | ICD-10-CM | POA: Diagnosis not present

## 2017-03-04 DIAGNOSIS — E11621 Type 2 diabetes mellitus with foot ulcer: Secondary | ICD-10-CM | POA: Diagnosis not present

## 2017-03-04 DIAGNOSIS — N186 End stage renal disease: Secondary | ICD-10-CM | POA: Diagnosis not present

## 2017-03-04 DIAGNOSIS — N2581 Secondary hyperparathyroidism of renal origin: Secondary | ICD-10-CM | POA: Diagnosis not present

## 2017-03-04 DIAGNOSIS — D631 Anemia in chronic kidney disease: Secondary | ICD-10-CM | POA: Diagnosis not present

## 2017-03-04 DIAGNOSIS — L97422 Non-pressure chronic ulcer of left heel and midfoot with fat layer exposed: Secondary | ICD-10-CM | POA: Diagnosis not present

## 2017-03-04 DIAGNOSIS — L97522 Non-pressure chronic ulcer of other part of left foot with fat layer exposed: Secondary | ICD-10-CM | POA: Diagnosis not present

## 2017-03-04 DIAGNOSIS — S91309A Unspecified open wound, unspecified foot, initial encounter: Secondary | ICD-10-CM | POA: Diagnosis not present

## 2017-03-04 DIAGNOSIS — M86671 Other chronic osteomyelitis, right ankle and foot: Secondary | ICD-10-CM | POA: Diagnosis not present

## 2017-03-04 DIAGNOSIS — E11622 Type 2 diabetes mellitus with other skin ulcer: Secondary | ICD-10-CM | POA: Diagnosis not present

## 2017-03-04 DIAGNOSIS — M8668 Other chronic osteomyelitis, other site: Secondary | ICD-10-CM | POA: Diagnosis not present

## 2017-03-04 DIAGNOSIS — E1169 Type 2 diabetes mellitus with other specified complication: Secondary | ICD-10-CM | POA: Diagnosis not present

## 2017-03-04 LAB — GLUCOSE, CAPILLARY
Glucose-Capillary: 150 mg/dL — ABNORMAL HIGH (ref 65–99)
Glucose-Capillary: 133 mg/dL — ABNORMAL HIGH (ref 65–99)

## 2017-03-07 ENCOUNTER — Encounter: Payer: Self-pay | Admitting: Infectious Diseases

## 2017-03-07 ENCOUNTER — Telehealth: Payer: Self-pay | Admitting: *Deleted

## 2017-03-07 ENCOUNTER — Ambulatory Visit (INDEPENDENT_AMBULATORY_CARE_PROVIDER_SITE_OTHER): Payer: Medicare Other | Admitting: Infectious Diseases

## 2017-03-07 DIAGNOSIS — L97529 Non-pressure chronic ulcer of other part of left foot with unspecified severity: Secondary | ICD-10-CM | POA: Diagnosis not present

## 2017-03-07 DIAGNOSIS — E1121 Type 2 diabetes mellitus with diabetic nephropathy: Secondary | ICD-10-CM | POA: Diagnosis not present

## 2017-03-07 DIAGNOSIS — E11621 Type 2 diabetes mellitus with foot ulcer: Secondary | ICD-10-CM | POA: Diagnosis present

## 2017-03-07 DIAGNOSIS — Z992 Dependence on renal dialysis: Secondary | ICD-10-CM

## 2017-03-07 DIAGNOSIS — D631 Anemia in chronic kidney disease: Secondary | ICD-10-CM | POA: Diagnosis not present

## 2017-03-07 DIAGNOSIS — N186 End stage renal disease: Secondary | ICD-10-CM

## 2017-03-07 DIAGNOSIS — M86372 Chronic multifocal osteomyelitis, left ankle and foot: Secondary | ICD-10-CM

## 2017-03-07 DIAGNOSIS — N2581 Secondary hyperparathyroidism of renal origin: Secondary | ICD-10-CM | POA: Diagnosis not present

## 2017-03-07 DIAGNOSIS — M86271 Subacute osteomyelitis, right ankle and foot: Secondary | ICD-10-CM | POA: Diagnosis not present

## 2017-03-07 DIAGNOSIS — S91309A Unspecified open wound, unspecified foot, initial encounter: Secondary | ICD-10-CM | POA: Diagnosis not present

## 2017-03-07 NOTE — Assessment & Plan Note (Signed)
Will stop his anbx [facility]).  Will see him back in 1 month to see if he is still healing well.  He completes hyperbaric O2 this week.

## 2017-03-07 NOTE — Assessment & Plan Note (Signed)
Appears to be doing well His fistula has +bruit. Clean.

## 2017-03-07 NOTE — Assessment & Plan Note (Signed)
Improved post surgery.  Will stop his anbx.

## 2017-03-07 NOTE — Telephone Encounter (Signed)
Verbal order per Dr. [name] given to Elmyra Ricks at patient's dialysis center to stop patient's IV antibiotics today. [phone])

## 2017-03-07 NOTE — Assessment & Plan Note (Signed)
States FSG have been pretty good. ~140 Needs close f/u

## 2017-03-07 NOTE — Progress Notes (Signed)
   Subjective:    Patient ID: [name] Sages Sr., male    DOB: June 10, 1951, 66 y.o.   MRN: [id number]  HPI 66 yo M with hx of DM2, ESRD, CAD, and ulcers on R first metatarsal head; L 5th and L 1st MT heads. He has had local debridement of callus.  He has previously had R 5th ray-amp.  In March he received a course of vanco/ceftaz at HD for ~ 1 month. He was then transitioned to po doxy.  In July 2017 he recieved Vancomycin due to increasing drainage from his wound. Cx of this was polymicrobial.  He was in hospital 08-2016 and was treated with 6 weeks of vanc/oceftaz for his foot wounds. He was rec to have vascular and surgical evals at that time however pt wished to go home.  He states these anbx healed his wounds.  He has been followed at [facility] and has been getting hyperbaric therapy.  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  His last MRI showed osteo of R foot (Sept 2017).   He saw ortho on 01-03-17 and was recommended to have R 2nd toe amputation. He also has non-healing L foot ulcer.  He called ID in December was and was restarted on vanco/ceftaz (12-10-16).  Plain film L foot 01-04-17 was (-).  States his sugars have been irregular, can't have hyperbaric O2 if his FSG <130.  Non-smoked.   ID visit 1-10 his anbx were continued at HD (ceftaz/vanco)  He was again seen by vascular 1-23 and he has no arterial insufficiency.   Had amputation 1-26 of R 2nd toe, partial 3rd toe. 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 At ID f/u on 2-8 he continued to have wounds wounds on his L foot. He has a new wound on the dorsum of his L foot.  He has been getting hyperbaric O2 therapy.  He was continued on his anbx via HD.   He has surgical f/u on 2-10.  He has concerns about wounds today- on his L foot they are still open and he has blistering on the dorsum of his foot. Still  having some drainage from his wounds.  Has WOC f/u tomorrow.  He has renal txp eval ongoing.   Review of Systems  Constitutional: Negative for chills and fever.  Gastrointestinal: Negative for constipation and diarrhea.  Genitourinary: Negative for difficulty urinating.  Skin: Positive for wound.  Please see HPI. 12 point ROS o/w (-)     Objective:   Physical Exam  Constitutional: He appears well-developed and well-nourished.  HENT:  Mouth/Throat: No oropharyngeal exudate.  Eyes: EOM are normal. Pupils are equal, round, and reactive to light.  Neck: Neck supple.  Cardiovascular: Normal rate, regular rhythm and normal heart sounds.   Pulmonary/Chest: Effort normal and breath sounds normal.  Abdominal: Soft. Bowel sounds are normal. There is no tenderness. There is no rebound.  Musculoskeletal:       Feet:  Lymphadenopathy:    He has no cervical adenopathy.   L foot wounds clean, no d/c. There is no heat in his foot.             Assessment & Plan:

## 2017-03-08 DIAGNOSIS — E11621 Type 2 diabetes mellitus with foot ulcer: Secondary | ICD-10-CM | POA: Diagnosis not present

## 2017-03-08 DIAGNOSIS — M8668 Other chronic osteomyelitis, other site: Secondary | ICD-10-CM | POA: Diagnosis not present

## 2017-03-08 DIAGNOSIS — E1169 Type 2 diabetes mellitus with other specified complication: Secondary | ICD-10-CM | POA: Diagnosis not present

## 2017-03-08 DIAGNOSIS — L97522 Non-pressure chronic ulcer of other part of left foot with fat layer exposed: Secondary | ICD-10-CM | POA: Diagnosis not present

## 2017-03-08 DIAGNOSIS — E11622 Type 2 diabetes mellitus with other skin ulcer: Secondary | ICD-10-CM | POA: Diagnosis not present

## 2017-03-08 DIAGNOSIS — M86671 Other chronic osteomyelitis, right ankle and foot: Secondary | ICD-10-CM | POA: Diagnosis not present

## 2017-03-08 DIAGNOSIS — L97422 Non-pressure chronic ulcer of left heel and midfoot with fat layer exposed: Secondary | ICD-10-CM | POA: Diagnosis not present

## 2017-03-08 LAB — GLUCOSE, CAPILLARY
Glucose-Capillary: 176 mg/dL — ABNORMAL HIGH (ref 65–99)
Glucose-Capillary: 154 mg/dL — ABNORMAL HIGH (ref 65–99)

## 2017-03-09 DIAGNOSIS — D631 Anemia in chronic kidney disease: Secondary | ICD-10-CM | POA: Diagnosis not present

## 2017-03-09 DIAGNOSIS — N2581 Secondary hyperparathyroidism of renal origin: Secondary | ICD-10-CM | POA: Diagnosis not present

## 2017-03-09 DIAGNOSIS — N186 End stage renal disease: Secondary | ICD-10-CM | POA: Diagnosis not present

## 2017-03-09 DIAGNOSIS — E1121 Type 2 diabetes mellitus with diabetic nephropathy: Secondary | ICD-10-CM | POA: Diagnosis not present

## 2017-03-09 DIAGNOSIS — E1169 Type 2 diabetes mellitus with other specified complication: Secondary | ICD-10-CM | POA: Diagnosis not present

## 2017-03-09 DIAGNOSIS — E11621 Type 2 diabetes mellitus with foot ulcer: Secondary | ICD-10-CM | POA: Diagnosis not present

## 2017-03-09 DIAGNOSIS — L97522 Non-pressure chronic ulcer of other part of left foot with fat layer exposed: Secondary | ICD-10-CM | POA: Diagnosis not present

## 2017-03-09 DIAGNOSIS — S91309A Unspecified open wound, unspecified foot, initial encounter: Secondary | ICD-10-CM | POA: Diagnosis not present

## 2017-03-09 DIAGNOSIS — E11622 Type 2 diabetes mellitus with other skin ulcer: Secondary | ICD-10-CM | POA: Diagnosis not present

## 2017-03-09 DIAGNOSIS — M8668 Other chronic osteomyelitis, other site: Secondary | ICD-10-CM | POA: Diagnosis not present

## 2017-03-09 DIAGNOSIS — L97422 Non-pressure chronic ulcer of left heel and midfoot with fat layer exposed: Secondary | ICD-10-CM | POA: Diagnosis not present

## 2017-03-09 DIAGNOSIS — Z992 Dependence on renal dialysis: Secondary | ICD-10-CM | POA: Diagnosis not present

## 2017-03-09 DIAGNOSIS — M86671 Other chronic osteomyelitis, right ankle and foot: Secondary | ICD-10-CM | POA: Diagnosis not present

## 2017-03-09 LAB — GLUCOSE, CAPILLARY
Glucose-Capillary: 160 mg/dL — ABNORMAL HIGH (ref 65–99)
Glucose-Capillary: 160 mg/dL — ABNORMAL HIGH (ref 65–99)

## 2017-03-10 DIAGNOSIS — M86671 Other chronic osteomyelitis, right ankle and foot: Secondary | ICD-10-CM | POA: Diagnosis not present

## 2017-03-10 DIAGNOSIS — E1169 Type 2 diabetes mellitus with other specified complication: Secondary | ICD-10-CM | POA: Diagnosis not present

## 2017-03-10 DIAGNOSIS — E11622 Type 2 diabetes mellitus with other skin ulcer: Secondary | ICD-10-CM | POA: Diagnosis not present

## 2017-03-10 DIAGNOSIS — L97522 Non-pressure chronic ulcer of other part of left foot with fat layer exposed: Secondary | ICD-10-CM | POA: Diagnosis not present

## 2017-03-10 DIAGNOSIS — L97422 Non-pressure chronic ulcer of left heel and midfoot with fat layer exposed: Secondary | ICD-10-CM | POA: Diagnosis not present

## 2017-03-10 DIAGNOSIS — M8668 Other chronic osteomyelitis, other site: Secondary | ICD-10-CM | POA: Diagnosis not present

## 2017-03-10 DIAGNOSIS — E11621 Type 2 diabetes mellitus with foot ulcer: Secondary | ICD-10-CM | POA: Diagnosis not present

## 2017-03-10 LAB — GLUCOSE, CAPILLARY
GLUCOSE-CAPILLARY: 137 mg/dL — AB (ref 65–99)
GLUCOSE-CAPILLARY: 140 mg/dL — AB (ref 65–99)

## 2017-03-11 DIAGNOSIS — E1121 Type 2 diabetes mellitus with diabetic nephropathy: Secondary | ICD-10-CM | POA: Diagnosis not present

## 2017-03-11 DIAGNOSIS — L97422 Non-pressure chronic ulcer of left heel and midfoot with fat layer exposed: Secondary | ICD-10-CM | POA: Diagnosis not present

## 2017-03-11 DIAGNOSIS — E11622 Type 2 diabetes mellitus with other skin ulcer: Secondary | ICD-10-CM | POA: Diagnosis not present

## 2017-03-11 DIAGNOSIS — N186 End stage renal disease: Secondary | ICD-10-CM | POA: Diagnosis not present

## 2017-03-11 DIAGNOSIS — S91309A Unspecified open wound, unspecified foot, initial encounter: Secondary | ICD-10-CM | POA: Diagnosis not present

## 2017-03-11 DIAGNOSIS — Z992 Dependence on renal dialysis: Secondary | ICD-10-CM | POA: Diagnosis not present

## 2017-03-11 DIAGNOSIS — E11621 Type 2 diabetes mellitus with foot ulcer: Secondary | ICD-10-CM | POA: Diagnosis not present

## 2017-03-11 DIAGNOSIS — N2581 Secondary hyperparathyroidism of renal origin: Secondary | ICD-10-CM | POA: Diagnosis not present

## 2017-03-11 DIAGNOSIS — M8668 Other chronic osteomyelitis, other site: Secondary | ICD-10-CM | POA: Diagnosis not present

## 2017-03-11 DIAGNOSIS — E1169 Type 2 diabetes mellitus with other specified complication: Secondary | ICD-10-CM | POA: Diagnosis not present

## 2017-03-11 DIAGNOSIS — L97522 Non-pressure chronic ulcer of other part of left foot with fat layer exposed: Secondary | ICD-10-CM | POA: Diagnosis not present

## 2017-03-11 DIAGNOSIS — M86671 Other chronic osteomyelitis, right ankle and foot: Secondary | ICD-10-CM | POA: Diagnosis not present

## 2017-03-11 DIAGNOSIS — D631 Anemia in chronic kidney disease: Secondary | ICD-10-CM | POA: Diagnosis not present

## 2017-03-11 LAB — GLUCOSE, CAPILLARY
Glucose-Capillary: 170 mg/dL — ABNORMAL HIGH (ref 65–99)
Glucose-Capillary: 145 mg/dL — ABNORMAL HIGH (ref 65–99)

## 2017-03-14 DIAGNOSIS — Z992 Dependence on renal dialysis: Secondary | ICD-10-CM | POA: Diagnosis not present

## 2017-03-14 DIAGNOSIS — D631 Anemia in chronic kidney disease: Secondary | ICD-10-CM | POA: Diagnosis not present

## 2017-03-14 DIAGNOSIS — S91309A Unspecified open wound, unspecified foot, initial encounter: Secondary | ICD-10-CM | POA: Diagnosis not present

## 2017-03-14 DIAGNOSIS — E1121 Type 2 diabetes mellitus with diabetic nephropathy: Secondary | ICD-10-CM | POA: Diagnosis not present

## 2017-03-14 DIAGNOSIS — N186 End stage renal disease: Secondary | ICD-10-CM | POA: Diagnosis not present

## 2017-03-14 DIAGNOSIS — N2581 Secondary hyperparathyroidism of renal origin: Secondary | ICD-10-CM | POA: Diagnosis not present

## 2017-03-15 DIAGNOSIS — L97522 Non-pressure chronic ulcer of other part of left foot with fat layer exposed: Secondary | ICD-10-CM | POA: Diagnosis not present

## 2017-03-15 DIAGNOSIS — L97422 Non-pressure chronic ulcer of left heel and midfoot with fat layer exposed: Secondary | ICD-10-CM | POA: Diagnosis not present

## 2017-03-15 DIAGNOSIS — E1169 Type 2 diabetes mellitus with other specified complication: Secondary | ICD-10-CM | POA: Diagnosis not present

## 2017-03-15 DIAGNOSIS — E11622 Type 2 diabetes mellitus with other skin ulcer: Secondary | ICD-10-CM | POA: Diagnosis not present

## 2017-03-15 DIAGNOSIS — E11621 Type 2 diabetes mellitus with foot ulcer: Secondary | ICD-10-CM | POA: Diagnosis not present

## 2017-03-15 DIAGNOSIS — M86671 Other chronic osteomyelitis, right ankle and foot: Secondary | ICD-10-CM | POA: Diagnosis not present

## 2017-03-16 DIAGNOSIS — D631 Anemia in chronic kidney disease: Secondary | ICD-10-CM | POA: Diagnosis not present

## 2017-03-16 DIAGNOSIS — Z992 Dependence on renal dialysis: Secondary | ICD-10-CM | POA: Diagnosis not present

## 2017-03-16 DIAGNOSIS — N2581 Secondary hyperparathyroidism of renal origin: Secondary | ICD-10-CM | POA: Diagnosis not present

## 2017-03-16 DIAGNOSIS — N186 End stage renal disease: Secondary | ICD-10-CM | POA: Diagnosis not present

## 2017-03-16 DIAGNOSIS — E1121 Type 2 diabetes mellitus with diabetic nephropathy: Secondary | ICD-10-CM | POA: Diagnosis not present

## 2017-03-16 DIAGNOSIS — S91309A Unspecified open wound, unspecified foot, initial encounter: Secondary | ICD-10-CM | POA: Diagnosis not present

## 2017-03-18 DIAGNOSIS — N186 End stage renal disease: Secondary | ICD-10-CM | POA: Diagnosis not present

## 2017-03-18 DIAGNOSIS — E1121 Type 2 diabetes mellitus with diabetic nephropathy: Secondary | ICD-10-CM | POA: Diagnosis not present

## 2017-03-18 DIAGNOSIS — S91309A Unspecified open wound, unspecified foot, initial encounter: Secondary | ICD-10-CM | POA: Diagnosis not present

## 2017-03-18 DIAGNOSIS — N2581 Secondary hyperparathyroidism of renal origin: Secondary | ICD-10-CM | POA: Diagnosis not present

## 2017-03-18 DIAGNOSIS — D631 Anemia in chronic kidney disease: Secondary | ICD-10-CM | POA: Diagnosis not present

## 2017-03-18 DIAGNOSIS — Z992 Dependence on renal dialysis: Secondary | ICD-10-CM | POA: Diagnosis not present

## 2017-03-21 DIAGNOSIS — E1121 Type 2 diabetes mellitus with diabetic nephropathy: Secondary | ICD-10-CM | POA: Diagnosis not present

## 2017-03-21 DIAGNOSIS — N186 End stage renal disease: Secondary | ICD-10-CM | POA: Diagnosis not present

## 2017-03-21 DIAGNOSIS — S91309A Unspecified open wound, unspecified foot, initial encounter: Secondary | ICD-10-CM | POA: Diagnosis not present

## 2017-03-21 DIAGNOSIS — D631 Anemia in chronic kidney disease: Secondary | ICD-10-CM | POA: Diagnosis not present

## 2017-03-21 DIAGNOSIS — Z992 Dependence on renal dialysis: Secondary | ICD-10-CM | POA: Diagnosis not present

## 2017-03-21 DIAGNOSIS — N2581 Secondary hyperparathyroidism of renal origin: Secondary | ICD-10-CM | POA: Diagnosis not present

## 2017-03-22 DIAGNOSIS — L97422 Non-pressure chronic ulcer of left heel and midfoot with fat layer exposed: Secondary | ICD-10-CM | POA: Diagnosis not present

## 2017-03-22 DIAGNOSIS — E11622 Type 2 diabetes mellitus with other skin ulcer: Secondary | ICD-10-CM | POA: Diagnosis not present

## 2017-03-22 DIAGNOSIS — M86671 Other chronic osteomyelitis, right ankle and foot: Secondary | ICD-10-CM | POA: Diagnosis not present

## 2017-03-22 DIAGNOSIS — L97522 Non-pressure chronic ulcer of other part of left foot with fat layer exposed: Secondary | ICD-10-CM | POA: Diagnosis not present

## 2017-03-22 DIAGNOSIS — E1169 Type 2 diabetes mellitus with other specified complication: Secondary | ICD-10-CM | POA: Diagnosis not present

## 2017-03-22 DIAGNOSIS — E11621 Type 2 diabetes mellitus with foot ulcer: Secondary | ICD-10-CM | POA: Diagnosis not present

## 2017-03-23 DIAGNOSIS — N2581 Secondary hyperparathyroidism of renal origin: Secondary | ICD-10-CM | POA: Diagnosis not present

## 2017-03-23 DIAGNOSIS — Z992 Dependence on renal dialysis: Secondary | ICD-10-CM | POA: Diagnosis not present

## 2017-03-23 DIAGNOSIS — D631 Anemia in chronic kidney disease: Secondary | ICD-10-CM | POA: Diagnosis not present

## 2017-03-23 DIAGNOSIS — E1121 Type 2 diabetes mellitus with diabetic nephropathy: Secondary | ICD-10-CM | POA: Diagnosis not present

## 2017-03-23 DIAGNOSIS — S91309A Unspecified open wound, unspecified foot, initial encounter: Secondary | ICD-10-CM | POA: Diagnosis not present

## 2017-03-23 DIAGNOSIS — N186 End stage renal disease: Secondary | ICD-10-CM | POA: Diagnosis not present

## 2017-03-25 DIAGNOSIS — D631 Anemia in chronic kidney disease: Secondary | ICD-10-CM | POA: Diagnosis not present

## 2017-03-25 DIAGNOSIS — S91309A Unspecified open wound, unspecified foot, initial encounter: Secondary | ICD-10-CM | POA: Diagnosis not present

## 2017-03-25 DIAGNOSIS — Z992 Dependence on renal dialysis: Secondary | ICD-10-CM | POA: Diagnosis not present

## 2017-03-25 DIAGNOSIS — E1121 Type 2 diabetes mellitus with diabetic nephropathy: Secondary | ICD-10-CM | POA: Diagnosis not present

## 2017-03-25 DIAGNOSIS — N2581 Secondary hyperparathyroidism of renal origin: Secondary | ICD-10-CM | POA: Diagnosis not present

## 2017-03-25 DIAGNOSIS — N186 End stage renal disease: Secondary | ICD-10-CM | POA: Diagnosis not present

## 2017-03-26 DIAGNOSIS — Z992 Dependence on renal dialysis: Secondary | ICD-10-CM | POA: Diagnosis not present

## 2017-03-26 DIAGNOSIS — E1122 Type 2 diabetes mellitus with diabetic chronic kidney disease: Secondary | ICD-10-CM | POA: Diagnosis not present

## 2017-03-26 DIAGNOSIS — N186 End stage renal disease: Secondary | ICD-10-CM | POA: Diagnosis not present

## 2017-03-28 DIAGNOSIS — D631 Anemia in chronic kidney disease: Secondary | ICD-10-CM | POA: Diagnosis not present

## 2017-03-28 DIAGNOSIS — N2581 Secondary hyperparathyroidism of renal origin: Secondary | ICD-10-CM | POA: Diagnosis not present

## 2017-03-28 DIAGNOSIS — E1121 Type 2 diabetes mellitus with diabetic nephropathy: Secondary | ICD-10-CM | POA: Diagnosis not present

## 2017-03-28 DIAGNOSIS — N186 End stage renal disease: Secondary | ICD-10-CM | POA: Diagnosis not present

## 2017-03-29 ENCOUNTER — Encounter (HOSPITAL_BASED_OUTPATIENT_CLINIC_OR_DEPARTMENT_OTHER): Payer: Medicare Other | Attending: Surgery

## 2017-03-29 DIAGNOSIS — I120 Hypertensive chronic kidney disease with stage 5 chronic kidney disease or end stage renal disease: Secondary | ICD-10-CM | POA: Diagnosis not present

## 2017-03-29 DIAGNOSIS — E1165 Type 2 diabetes mellitus with hyperglycemia: Secondary | ICD-10-CM | POA: Insufficient documentation

## 2017-03-29 DIAGNOSIS — E1122 Type 2 diabetes mellitus with diabetic chronic kidney disease: Secondary | ICD-10-CM | POA: Diagnosis not present

## 2017-03-29 DIAGNOSIS — Z992 Dependence on renal dialysis: Secondary | ICD-10-CM | POA: Insufficient documentation

## 2017-03-29 DIAGNOSIS — N186 End stage renal disease: Secondary | ICD-10-CM | POA: Insufficient documentation

## 2017-03-29 DIAGNOSIS — E1140 Type 2 diabetes mellitus with diabetic neuropathy, unspecified: Secondary | ICD-10-CM | POA: Insufficient documentation

## 2017-03-29 DIAGNOSIS — E11621 Type 2 diabetes mellitus with foot ulcer: Secondary | ICD-10-CM | POA: Insufficient documentation

## 2017-03-29 DIAGNOSIS — L97422 Non-pressure chronic ulcer of left heel and midfoot with fat layer exposed: Secondary | ICD-10-CM | POA: Diagnosis not present

## 2017-03-29 DIAGNOSIS — L97522 Non-pressure chronic ulcer of other part of left foot with fat layer exposed: Secondary | ICD-10-CM | POA: Insufficient documentation

## 2017-03-30 DIAGNOSIS — E1121 Type 2 diabetes mellitus with diabetic nephropathy: Secondary | ICD-10-CM | POA: Diagnosis not present

## 2017-03-30 DIAGNOSIS — N186 End stage renal disease: Secondary | ICD-10-CM | POA: Diagnosis not present

## 2017-03-30 DIAGNOSIS — D631 Anemia in chronic kidney disease: Secondary | ICD-10-CM | POA: Diagnosis not present

## 2017-03-30 DIAGNOSIS — N2581 Secondary hyperparathyroidism of renal origin: Secondary | ICD-10-CM | POA: Diagnosis not present

## 2017-04-01 DIAGNOSIS — E1121 Type 2 diabetes mellitus with diabetic nephropathy: Secondary | ICD-10-CM | POA: Diagnosis not present

## 2017-04-01 DIAGNOSIS — D631 Anemia in chronic kidney disease: Secondary | ICD-10-CM | POA: Diagnosis not present

## 2017-04-01 DIAGNOSIS — N186 End stage renal disease: Secondary | ICD-10-CM | POA: Diagnosis not present

## 2017-04-01 DIAGNOSIS — N2581 Secondary hyperparathyroidism of renal origin: Secondary | ICD-10-CM | POA: Diagnosis not present

## 2017-04-04 DIAGNOSIS — E1121 Type 2 diabetes mellitus with diabetic nephropathy: Secondary | ICD-10-CM | POA: Diagnosis not present

## 2017-04-04 DIAGNOSIS — N186 End stage renal disease: Secondary | ICD-10-CM | POA: Diagnosis not present

## 2017-04-04 DIAGNOSIS — D631 Anemia in chronic kidney disease: Secondary | ICD-10-CM | POA: Diagnosis not present

## 2017-04-04 DIAGNOSIS — N2581 Secondary hyperparathyroidism of renal origin: Secondary | ICD-10-CM | POA: Diagnosis not present

## 2017-04-05 DIAGNOSIS — L97522 Non-pressure chronic ulcer of other part of left foot with fat layer exposed: Secondary | ICD-10-CM | POA: Diagnosis not present

## 2017-04-05 DIAGNOSIS — N186 End stage renal disease: Secondary | ICD-10-CM | POA: Diagnosis not present

## 2017-04-05 DIAGNOSIS — I120 Hypertensive chronic kidney disease with stage 5 chronic kidney disease or end stage renal disease: Secondary | ICD-10-CM | POA: Diagnosis not present

## 2017-04-05 DIAGNOSIS — E11621 Type 2 diabetes mellitus with foot ulcer: Secondary | ICD-10-CM | POA: Diagnosis not present

## 2017-04-05 DIAGNOSIS — L97422 Non-pressure chronic ulcer of left heel and midfoot with fat layer exposed: Secondary | ICD-10-CM | POA: Diagnosis not present

## 2017-04-05 DIAGNOSIS — E1122 Type 2 diabetes mellitus with diabetic chronic kidney disease: Secondary | ICD-10-CM | POA: Diagnosis not present

## 2017-04-08 DIAGNOSIS — N186 End stage renal disease: Secondary | ICD-10-CM | POA: Diagnosis not present

## 2017-04-08 DIAGNOSIS — N2581 Secondary hyperparathyroidism of renal origin: Secondary | ICD-10-CM | POA: Diagnosis not present

## 2017-04-08 DIAGNOSIS — D631 Anemia in chronic kidney disease: Secondary | ICD-10-CM | POA: Diagnosis not present

## 2017-04-08 DIAGNOSIS — E1121 Type 2 diabetes mellitus with diabetic nephropathy: Secondary | ICD-10-CM | POA: Diagnosis not present

## 2017-04-11 DIAGNOSIS — D631 Anemia in chronic kidney disease: Secondary | ICD-10-CM | POA: Diagnosis not present

## 2017-04-11 DIAGNOSIS — N186 End stage renal disease: Secondary | ICD-10-CM | POA: Diagnosis not present

## 2017-04-11 DIAGNOSIS — N2581 Secondary hyperparathyroidism of renal origin: Secondary | ICD-10-CM | POA: Diagnosis not present

## 2017-04-11 DIAGNOSIS — E1121 Type 2 diabetes mellitus with diabetic nephropathy: Secondary | ICD-10-CM | POA: Diagnosis not present

## 2017-04-12 DIAGNOSIS — N186 End stage renal disease: Secondary | ICD-10-CM | POA: Diagnosis not present

## 2017-04-12 DIAGNOSIS — L97422 Non-pressure chronic ulcer of left heel and midfoot with fat layer exposed: Secondary | ICD-10-CM | POA: Diagnosis not present

## 2017-04-12 DIAGNOSIS — L97522 Non-pressure chronic ulcer of other part of left foot with fat layer exposed: Secondary | ICD-10-CM | POA: Diagnosis not present

## 2017-04-12 DIAGNOSIS — I120 Hypertensive chronic kidney disease with stage 5 chronic kidney disease or end stage renal disease: Secondary | ICD-10-CM | POA: Diagnosis not present

## 2017-04-12 DIAGNOSIS — E1122 Type 2 diabetes mellitus with diabetic chronic kidney disease: Secondary | ICD-10-CM | POA: Diagnosis not present

## 2017-04-12 DIAGNOSIS — E11621 Type 2 diabetes mellitus with foot ulcer: Secondary | ICD-10-CM | POA: Diagnosis not present

## 2017-04-13 DIAGNOSIS — N186 End stage renal disease: Secondary | ICD-10-CM | POA: Diagnosis not present

## 2017-04-13 DIAGNOSIS — N2581 Secondary hyperparathyroidism of renal origin: Secondary | ICD-10-CM | POA: Diagnosis not present

## 2017-04-13 DIAGNOSIS — E1121 Type 2 diabetes mellitus with diabetic nephropathy: Secondary | ICD-10-CM | POA: Diagnosis not present

## 2017-04-13 DIAGNOSIS — D631 Anemia in chronic kidney disease: Secondary | ICD-10-CM | POA: Diagnosis not present

## 2017-04-15 DIAGNOSIS — N2581 Secondary hyperparathyroidism of renal origin: Secondary | ICD-10-CM | POA: Diagnosis not present

## 2017-04-15 DIAGNOSIS — D631 Anemia in chronic kidney disease: Secondary | ICD-10-CM | POA: Diagnosis not present

## 2017-04-15 DIAGNOSIS — E1121 Type 2 diabetes mellitus with diabetic nephropathy: Secondary | ICD-10-CM | POA: Diagnosis not present

## 2017-04-15 DIAGNOSIS — N186 End stage renal disease: Secondary | ICD-10-CM | POA: Diagnosis not present

## 2017-04-18 DIAGNOSIS — D631 Anemia in chronic kidney disease: Secondary | ICD-10-CM | POA: Diagnosis not present

## 2017-04-18 DIAGNOSIS — N2581 Secondary hyperparathyroidism of renal origin: Secondary | ICD-10-CM | POA: Diagnosis not present

## 2017-04-18 DIAGNOSIS — N186 End stage renal disease: Secondary | ICD-10-CM | POA: Diagnosis not present

## 2017-04-18 DIAGNOSIS — E1121 Type 2 diabetes mellitus with diabetic nephropathy: Secondary | ICD-10-CM | POA: Diagnosis not present

## 2017-04-19 DIAGNOSIS — E11621 Type 2 diabetes mellitus with foot ulcer: Secondary | ICD-10-CM | POA: Diagnosis not present

## 2017-04-19 DIAGNOSIS — I120 Hypertensive chronic kidney disease with stage 5 chronic kidney disease or end stage renal disease: Secondary | ICD-10-CM | POA: Diagnosis not present

## 2017-04-19 DIAGNOSIS — L97522 Non-pressure chronic ulcer of other part of left foot with fat layer exposed: Secondary | ICD-10-CM | POA: Diagnosis not present

## 2017-04-19 DIAGNOSIS — L97422 Non-pressure chronic ulcer of left heel and midfoot with fat layer exposed: Secondary | ICD-10-CM | POA: Diagnosis not present

## 2017-04-19 DIAGNOSIS — E1122 Type 2 diabetes mellitus with diabetic chronic kidney disease: Secondary | ICD-10-CM | POA: Diagnosis not present

## 2017-04-19 DIAGNOSIS — N186 End stage renal disease: Secondary | ICD-10-CM | POA: Diagnosis not present

## 2017-04-20 DIAGNOSIS — N186 End stage renal disease: Secondary | ICD-10-CM | POA: Diagnosis not present

## 2017-04-20 DIAGNOSIS — E1121 Type 2 diabetes mellitus with diabetic nephropathy: Secondary | ICD-10-CM | POA: Diagnosis not present

## 2017-04-20 DIAGNOSIS — N2581 Secondary hyperparathyroidism of renal origin: Secondary | ICD-10-CM | POA: Diagnosis not present

## 2017-04-20 DIAGNOSIS — D631 Anemia in chronic kidney disease: Secondary | ICD-10-CM | POA: Diagnosis not present

## 2017-04-22 DIAGNOSIS — N2581 Secondary hyperparathyroidism of renal origin: Secondary | ICD-10-CM | POA: Diagnosis not present

## 2017-04-22 DIAGNOSIS — N186 End stage renal disease: Secondary | ICD-10-CM | POA: Diagnosis not present

## 2017-04-22 DIAGNOSIS — E1121 Type 2 diabetes mellitus with diabetic nephropathy: Secondary | ICD-10-CM | POA: Diagnosis not present

## 2017-04-22 DIAGNOSIS — D631 Anemia in chronic kidney disease: Secondary | ICD-10-CM | POA: Diagnosis not present

## 2017-04-25 DIAGNOSIS — Z992 Dependence on renal dialysis: Secondary | ICD-10-CM | POA: Diagnosis not present

## 2017-04-25 DIAGNOSIS — E1122 Type 2 diabetes mellitus with diabetic chronic kidney disease: Secondary | ICD-10-CM | POA: Diagnosis not present

## 2017-04-25 DIAGNOSIS — N2581 Secondary hyperparathyroidism of renal origin: Secondary | ICD-10-CM | POA: Diagnosis not present

## 2017-04-25 DIAGNOSIS — N186 End stage renal disease: Secondary | ICD-10-CM | POA: Diagnosis not present

## 2017-04-25 DIAGNOSIS — D631 Anemia in chronic kidney disease: Secondary | ICD-10-CM | POA: Diagnosis not present

## 2017-04-25 DIAGNOSIS — E1121 Type 2 diabetes mellitus with diabetic nephropathy: Secondary | ICD-10-CM | POA: Diagnosis not present

## 2017-04-26 ENCOUNTER — Encounter (HOSPITAL_BASED_OUTPATIENT_CLINIC_OR_DEPARTMENT_OTHER): Payer: Medicare Other | Attending: Surgery

## 2017-04-26 DIAGNOSIS — L97522 Non-pressure chronic ulcer of other part of left foot with fat layer exposed: Secondary | ICD-10-CM | POA: Diagnosis not present

## 2017-04-26 DIAGNOSIS — L97521 Non-pressure chronic ulcer of other part of left foot limited to breakdown of skin: Secondary | ICD-10-CM | POA: Insufficient documentation

## 2017-04-26 DIAGNOSIS — I132 Hypertensive heart and chronic kidney disease with heart failure and with stage 5 chronic kidney disease, or end stage renal disease: Secondary | ICD-10-CM | POA: Insufficient documentation

## 2017-04-26 DIAGNOSIS — Z992 Dependence on renal dialysis: Secondary | ICD-10-CM | POA: Diagnosis not present

## 2017-04-26 DIAGNOSIS — L97512 Non-pressure chronic ulcer of other part of right foot with fat layer exposed: Secondary | ICD-10-CM | POA: Insufficient documentation

## 2017-04-26 DIAGNOSIS — E1140 Type 2 diabetes mellitus with diabetic neuropathy, unspecified: Secondary | ICD-10-CM | POA: Diagnosis not present

## 2017-04-26 DIAGNOSIS — E1151 Type 2 diabetes mellitus with diabetic peripheral angiopathy without gangrene: Secondary | ICD-10-CM | POA: Insufficient documentation

## 2017-04-26 DIAGNOSIS — E1122 Type 2 diabetes mellitus with diabetic chronic kidney disease: Secondary | ICD-10-CM | POA: Insufficient documentation

## 2017-04-26 DIAGNOSIS — N186 End stage renal disease: Secondary | ICD-10-CM | POA: Insufficient documentation

## 2017-04-26 DIAGNOSIS — I509 Heart failure, unspecified: Secondary | ICD-10-CM | POA: Insufficient documentation

## 2017-04-26 DIAGNOSIS — E11621 Type 2 diabetes mellitus with foot ulcer: Secondary | ICD-10-CM | POA: Diagnosis not present

## 2017-04-27 DIAGNOSIS — N2581 Secondary hyperparathyroidism of renal origin: Secondary | ICD-10-CM | POA: Diagnosis not present

## 2017-04-27 DIAGNOSIS — D631 Anemia in chronic kidney disease: Secondary | ICD-10-CM | POA: Diagnosis not present

## 2017-04-27 DIAGNOSIS — N186 End stage renal disease: Secondary | ICD-10-CM | POA: Diagnosis not present

## 2017-04-29 DIAGNOSIS — D631 Anemia in chronic kidney disease: Secondary | ICD-10-CM | POA: Diagnosis not present

## 2017-04-29 DIAGNOSIS — N186 End stage renal disease: Secondary | ICD-10-CM | POA: Diagnosis not present

## 2017-04-29 DIAGNOSIS — N2581 Secondary hyperparathyroidism of renal origin: Secondary | ICD-10-CM | POA: Diagnosis not present

## 2017-05-02 DIAGNOSIS — D631 Anemia in chronic kidney disease: Secondary | ICD-10-CM | POA: Diagnosis not present

## 2017-05-02 DIAGNOSIS — N186 End stage renal disease: Secondary | ICD-10-CM | POA: Diagnosis not present

## 2017-05-02 DIAGNOSIS — N2581 Secondary hyperparathyroidism of renal origin: Secondary | ICD-10-CM | POA: Diagnosis not present

## 2017-05-03 ENCOUNTER — Ambulatory Visit (HOSPITAL_COMMUNITY)
Admission: RE | Admit: 2017-05-03 | Discharge: 2017-05-03 | Disposition: A | Discharge status: Home / Self Care | Payer: Medicare Other | Source: Ambulatory Visit | Attending: Surgery | Admitting: Surgery

## 2017-05-03 ENCOUNTER — Other Ambulatory Visit: Payer: Self-pay | Admitting: Surgery

## 2017-05-03 DIAGNOSIS — L97522 Non-pressure chronic ulcer of other part of left foot with fat layer exposed: Secondary | ICD-10-CM | POA: Diagnosis not present

## 2017-05-03 DIAGNOSIS — M86172 Other acute osteomyelitis, left ankle and foot: Secondary | ICD-10-CM | POA: Insufficient documentation

## 2017-05-03 DIAGNOSIS — Z9889 Other specified postprocedural states: Secondary | ICD-10-CM | POA: Insufficient documentation

## 2017-05-03 DIAGNOSIS — E1122 Type 2 diabetes mellitus with diabetic chronic kidney disease: Secondary | ICD-10-CM | POA: Diagnosis not present

## 2017-05-03 DIAGNOSIS — M19072 Primary osteoarthritis, left ankle and foot: Secondary | ICD-10-CM | POA: Insufficient documentation

## 2017-05-03 DIAGNOSIS — M2062 Acquired deformities of toe(s), unspecified, left foot: Secondary | ICD-10-CM | POA: Insufficient documentation

## 2017-05-03 DIAGNOSIS — L97529 Non-pressure chronic ulcer of other part of left foot with unspecified severity: Secondary | ICD-10-CM | POA: Insufficient documentation

## 2017-05-03 DIAGNOSIS — L97512 Non-pressure chronic ulcer of other part of right foot with fat layer exposed: Secondary | ICD-10-CM | POA: Diagnosis not present

## 2017-05-03 DIAGNOSIS — I700 Atherosclerosis of aorta: Secondary | ICD-10-CM | POA: Insufficient documentation

## 2017-05-03 DIAGNOSIS — S90822A Blister (nonthermal), left foot, initial encounter: Secondary | ICD-10-CM | POA: Diagnosis not present

## 2017-05-03 DIAGNOSIS — I132 Hypertensive heart and chronic kidney disease with heart failure and with stage 5 chronic kidney disease, or end stage renal disease: Secondary | ICD-10-CM | POA: Diagnosis not present

## 2017-05-03 DIAGNOSIS — E11621 Type 2 diabetes mellitus with foot ulcer: Secondary | ICD-10-CM | POA: Diagnosis not present

## 2017-05-03 DIAGNOSIS — S91302A Unspecified open wound, left foot, initial encounter: Secondary | ICD-10-CM | POA: Diagnosis not present

## 2017-05-03 DIAGNOSIS — L97521 Non-pressure chronic ulcer of other part of left foot limited to breakdown of skin: Secondary | ICD-10-CM | POA: Diagnosis not present

## 2017-05-04 DIAGNOSIS — N2581 Secondary hyperparathyroidism of renal origin: Secondary | ICD-10-CM | POA: Diagnosis not present

## 2017-05-04 DIAGNOSIS — N186 End stage renal disease: Secondary | ICD-10-CM | POA: Diagnosis not present

## 2017-05-04 DIAGNOSIS — D631 Anemia in chronic kidney disease: Secondary | ICD-10-CM | POA: Diagnosis not present

## 2017-05-06 DIAGNOSIS — N2581 Secondary hyperparathyroidism of renal origin: Secondary | ICD-10-CM | POA: Diagnosis not present

## 2017-05-06 DIAGNOSIS — N186 End stage renal disease: Secondary | ICD-10-CM | POA: Diagnosis not present

## 2017-05-06 DIAGNOSIS — D631 Anemia in chronic kidney disease: Secondary | ICD-10-CM | POA: Diagnosis not present

## 2017-05-09 DIAGNOSIS — D631 Anemia in chronic kidney disease: Secondary | ICD-10-CM | POA: Diagnosis not present

## 2017-05-09 DIAGNOSIS — N186 End stage renal disease: Secondary | ICD-10-CM | POA: Diagnosis not present

## 2017-05-09 DIAGNOSIS — N2581 Secondary hyperparathyroidism of renal origin: Secondary | ICD-10-CM | POA: Diagnosis not present

## 2017-05-10 ENCOUNTER — Other Ambulatory Visit (HOSPITAL_COMMUNITY)
Admission: RE | Admit: 2017-05-10 | Discharge: 2017-05-10 | Disposition: A | Discharge status: Home / Self Care | Payer: Medicare Other | Source: Other Acute Inpatient Hospital | Attending: Surgery | Admitting: Surgery

## 2017-05-10 DIAGNOSIS — L97522 Non-pressure chronic ulcer of other part of left foot with fat layer exposed: Secondary | ICD-10-CM | POA: Diagnosis not present

## 2017-05-10 DIAGNOSIS — E11621 Type 2 diabetes mellitus with foot ulcer: Secondary | ICD-10-CM | POA: Diagnosis not present

## 2017-05-10 DIAGNOSIS — L97521 Non-pressure chronic ulcer of other part of left foot limited to breakdown of skin: Secondary | ICD-10-CM | POA: Diagnosis not present

## 2017-05-10 DIAGNOSIS — L97512 Non-pressure chronic ulcer of other part of right foot with fat layer exposed: Secondary | ICD-10-CM | POA: Diagnosis not present

## 2017-05-10 DIAGNOSIS — I132 Hypertensive heart and chronic kidney disease with heart failure and with stage 5 chronic kidney disease, or end stage renal disease: Secondary | ICD-10-CM | POA: Diagnosis not present

## 2017-05-10 DIAGNOSIS — E1122 Type 2 diabetes mellitus with diabetic chronic kidney disease: Secondary | ICD-10-CM | POA: Diagnosis not present

## 2017-05-11 DIAGNOSIS — D631 Anemia in chronic kidney disease: Secondary | ICD-10-CM | POA: Diagnosis not present

## 2017-05-11 DIAGNOSIS — N2581 Secondary hyperparathyroidism of renal origin: Secondary | ICD-10-CM | POA: Diagnosis not present

## 2017-05-11 DIAGNOSIS — N186 End stage renal disease: Secondary | ICD-10-CM | POA: Diagnosis not present

## 2017-05-13 DIAGNOSIS — N186 End stage renal disease: Secondary | ICD-10-CM | POA: Diagnosis not present

## 2017-05-13 DIAGNOSIS — D631 Anemia in chronic kidney disease: Secondary | ICD-10-CM | POA: Diagnosis not present

## 2017-05-13 DIAGNOSIS — N2581 Secondary hyperparathyroidism of renal origin: Secondary | ICD-10-CM | POA: Diagnosis not present

## 2017-05-15 LAB — AEROBIC/ANAEROBIC CULTURE W GRAM STAIN (SURGICAL/DEEP WOUND)

## 2017-05-15 LAB — AEROBIC/ANAEROBIC CULTURE (SURGICAL/DEEP WOUND)

## 2017-05-16 ENCOUNTER — Ambulatory Visit (INDEPENDENT_AMBULATORY_CARE_PROVIDER_SITE_OTHER): Payer: Medicare Other | Admitting: Infectious Diseases

## 2017-05-16 ENCOUNTER — Encounter: Payer: Self-pay | Admitting: Infectious Diseases

## 2017-05-16 ENCOUNTER — Inpatient Hospital Stay (HOSPITAL_COMMUNITY)
Admission: EM | Admit: 2017-05-16 | Discharge: 2017-05-25 | DRG: 617 | Disposition: A | Discharge status: Home Health | Payer: Medicare Other | Attending: Family Medicine | Admitting: Family Medicine

## 2017-05-16 ENCOUNTER — Emergency Department (HOSPITAL_COMMUNITY): Payer: Medicare Other

## 2017-05-16 ENCOUNTER — Encounter (HOSPITAL_COMMUNITY): Payer: Self-pay | Admitting: Emergency Medicine

## 2017-05-16 DIAGNOSIS — I959 Hypotension, unspecified: Secondary | ICD-10-CM | POA: Diagnosis present

## 2017-05-16 DIAGNOSIS — R0989 Other specified symptoms and signs involving the circulatory and respiratory systems: Secondary | ICD-10-CM | POA: Diagnosis not present

## 2017-05-16 DIAGNOSIS — M869 Osteomyelitis, unspecified: Secondary | ICD-10-CM | POA: Diagnosis not present

## 2017-05-16 DIAGNOSIS — M868X7 Other osteomyelitis, ankle and foot: Secondary | ICD-10-CM

## 2017-05-16 DIAGNOSIS — E1121 Type 2 diabetes mellitus with diabetic nephropathy: Secondary | ICD-10-CM

## 2017-05-16 DIAGNOSIS — I2510 Atherosclerotic heart disease of native coronary artery without angina pectoris: Secondary | ICD-10-CM | POA: Diagnosis present

## 2017-05-16 DIAGNOSIS — E118 Type 2 diabetes mellitus with unspecified complications: Secondary | ICD-10-CM | POA: Diagnosis not present

## 2017-05-16 DIAGNOSIS — I10 Essential (primary) hypertension: Secondary | ICD-10-CM | POA: Diagnosis not present

## 2017-05-16 DIAGNOSIS — E11621 Type 2 diabetes mellitus with foot ulcer: Secondary | ICD-10-CM | POA: Diagnosis present

## 2017-05-16 DIAGNOSIS — E1142 Type 2 diabetes mellitus with diabetic polyneuropathy: Secondary | ICD-10-CM | POA: Diagnosis present

## 2017-05-16 DIAGNOSIS — I5022 Chronic systolic (congestive) heart failure: Secondary | ICD-10-CM | POA: Diagnosis not present

## 2017-05-16 DIAGNOSIS — M86172 Other acute osteomyelitis, left ankle and foot: Secondary | ICD-10-CM | POA: Diagnosis not present

## 2017-05-16 DIAGNOSIS — Z880 Allergy status to penicillin: Secondary | ICD-10-CM

## 2017-05-16 DIAGNOSIS — Z794 Long term (current) use of insulin: Secondary | ICD-10-CM | POA: Diagnosis not present

## 2017-05-16 DIAGNOSIS — I420 Dilated cardiomyopathy: Secondary | ICD-10-CM | POA: Diagnosis present

## 2017-05-16 DIAGNOSIS — Z87891 Personal history of nicotine dependence: Secondary | ICD-10-CM | POA: Diagnosis not present

## 2017-05-16 DIAGNOSIS — Z01810 Encounter for preprocedural cardiovascular examination: Secondary | ICD-10-CM

## 2017-05-16 DIAGNOSIS — E1122 Type 2 diabetes mellitus with diabetic chronic kidney disease: Secondary | ICD-10-CM | POA: Diagnosis not present

## 2017-05-16 DIAGNOSIS — M79672 Pain in left foot: Secondary | ICD-10-CM | POA: Diagnosis not present

## 2017-05-16 DIAGNOSIS — I471 Supraventricular tachycardia: Secondary | ICD-10-CM | POA: Diagnosis not present

## 2017-05-16 DIAGNOSIS — S98912A Complete traumatic amputation of left foot, level unspecified, initial encounter: Secondary | ICD-10-CM | POA: Diagnosis not present

## 2017-05-16 DIAGNOSIS — R509 Fever, unspecified: Secondary | ICD-10-CM | POA: Diagnosis not present

## 2017-05-16 DIAGNOSIS — E1169 Type 2 diabetes mellitus with other specified complication: Secondary | ICD-10-CM | POA: Diagnosis not present

## 2017-05-16 DIAGNOSIS — I739 Peripheral vascular disease, unspecified: Secondary | ICD-10-CM | POA: Diagnosis not present

## 2017-05-16 DIAGNOSIS — N186 End stage renal disease: Secondary | ICD-10-CM | POA: Diagnosis not present

## 2017-05-16 DIAGNOSIS — I132 Hypertensive heart and chronic kidney disease with heart failure and with stage 5 chronic kidney disease, or end stage renal disease: Secondary | ICD-10-CM | POA: Diagnosis not present

## 2017-05-16 DIAGNOSIS — D631 Anemia in chronic kidney disease: Secondary | ICD-10-CM | POA: Diagnosis present

## 2017-05-16 DIAGNOSIS — M86572 Other chronic hematogenous osteomyelitis, left ankle and foot: Secondary | ICD-10-CM | POA: Diagnosis not present

## 2017-05-16 DIAGNOSIS — Z89421 Acquired absence of other right toe(s): Secondary | ICD-10-CM | POA: Diagnosis not present

## 2017-05-16 DIAGNOSIS — Z833 Family history of diabetes mellitus: Secondary | ICD-10-CM | POA: Diagnosis not present

## 2017-05-16 DIAGNOSIS — N2581 Secondary hyperparathyroidism of renal origin: Secondary | ICD-10-CM | POA: Diagnosis present

## 2017-05-16 DIAGNOSIS — S91302A Unspecified open wound, left foot, initial encounter: Secondary | ICD-10-CM | POA: Diagnosis not present

## 2017-05-16 DIAGNOSIS — Z992 Dependence on renal dialysis: Secondary | ICD-10-CM | POA: Diagnosis not present

## 2017-05-16 DIAGNOSIS — Z89422 Acquired absence of other left toe(s): Secondary | ICD-10-CM | POA: Diagnosis not present

## 2017-05-16 DIAGNOSIS — M86672 Other chronic osteomyelitis, left ankle and foot: Secondary | ICD-10-CM | POA: Diagnosis not present

## 2017-05-16 DIAGNOSIS — Z89432 Acquired absence of left foot: Secondary | ICD-10-CM | POA: Diagnosis not present

## 2017-05-16 DIAGNOSIS — E1129 Type 2 diabetes mellitus with other diabetic kidney complication: Secondary | ICD-10-CM | POA: Diagnosis not present

## 2017-05-16 DIAGNOSIS — G250 Essential tremor: Secondary | ICD-10-CM | POA: Diagnosis present

## 2017-05-16 DIAGNOSIS — E1151 Type 2 diabetes mellitus with diabetic peripheral angiopathy without gangrene: Secondary | ICD-10-CM | POA: Diagnosis present

## 2017-05-16 DIAGNOSIS — J329 Chronic sinusitis, unspecified: Secondary | ICD-10-CM | POA: Diagnosis not present

## 2017-05-16 DIAGNOSIS — R0602 Shortness of breath: Secondary | ICD-10-CM | POA: Diagnosis not present

## 2017-05-16 DIAGNOSIS — Z79899 Other long term (current) drug therapy: Secondary | ICD-10-CM | POA: Diagnosis not present

## 2017-05-16 DIAGNOSIS — I120 Hypertensive chronic kidney disease with stage 5 chronic kidney disease or end stage renal disease: Secondary | ICD-10-CM | POA: Diagnosis not present

## 2017-05-16 DIAGNOSIS — Z8739 Personal history of other diseases of the musculoskeletal system and connective tissue: Secondary | ICD-10-CM | POA: Diagnosis not present

## 2017-05-16 DIAGNOSIS — I517 Cardiomegaly: Secondary | ICD-10-CM | POA: Diagnosis present

## 2017-05-16 DIAGNOSIS — L97529 Non-pressure chronic ulcer of other part of left foot with unspecified severity: Secondary | ICD-10-CM | POA: Diagnosis present

## 2017-05-16 LAB — CBC WITH DIFFERENTIAL/PLATELET
Basophils Absolute: 0 10*3/uL (ref 0.0–0.1)
Basophils Relative: 0 %
EOS ABS: 0.3 10*3/uL (ref 0.0–0.7)
EOS PCT: 3 %
HCT: 37.8 % — ABNORMAL LOW (ref 39.0–52.0)
Hemoglobin: 12.4 g/dL — ABNORMAL LOW (ref 13.0–17.0)
LYMPHS ABS: 2.3 10*3/uL (ref 0.7–4.0)
Lymphocytes Relative: 21 %
MCH: 30.8 pg (ref 26.0–34.0)
MCHC: 32.8 g/dL (ref 30.0–36.0)
MCV: 94 fL (ref 78.0–100.0)
MONOS PCT: 8 %
Monocytes Absolute: 0.9 10*3/uL (ref 0.1–1.0)
Neutro Abs: 7.7 10*3/uL (ref 1.7–7.7)
Neutrophils Relative %: 68 %
PLATELETS: 268 10*3/uL (ref 150–400)
RBC: 4.02 MIL/uL — ABNORMAL LOW (ref 4.22–5.81)
RDW: 15.9 % — ABNORMAL HIGH (ref 11.5–15.5)
WBC: 11.2 10*3/uL — AB (ref 4.0–10.5)

## 2017-05-16 LAB — BASIC METABOLIC PANEL
Anion gap: 15 (ref 5–15)
BUN: 67 mg/dL — AB (ref 6–20)
CO2: 25 mmol/L (ref 22–32)
CREATININE: 13.82 mg/dL — AB (ref 0.61–1.24)
Calcium: 9.3 mg/dL (ref 8.9–10.3)
Chloride: 95 mmol/L — ABNORMAL LOW (ref 101–111)
GFR calc Af Amer: 4 mL/min — ABNORMAL LOW (ref >60)
GFR, EST NON AFRICAN AMERICAN: 3 mL/min — AB (ref >60)
Glucose, Bld: 142 mg/dL — ABNORMAL HIGH (ref 65–99)
Potassium: 5 mmol/L (ref 3.5–5.1)
SODIUM: 135 mmol/L (ref 135–145)

## 2017-05-16 LAB — I-STAT CG4 LACTIC ACID, ED
LACTIC ACID, VENOUS: 1.47 mmol/L (ref 0.5–1.9)
Lactic Acid, Venous: 0.59 mmol/L (ref 0.5–1.9)

## 2017-05-16 LAB — GLUCOSE, CAPILLARY: GLUCOSE-CAPILLARY: 228 mg/dL — AB (ref 65–99)

## 2017-05-16 MED ORDER — ALTEPLASE 2 MG IJ SOLR: 2.0000 mg | Freq: Once | INTRAMUSCULAR | Status: DC | PRN

## 2017-05-16 MED ORDER — ACETAMINOPHEN 325 MG PO TABS
ORAL_TABLET | ORAL | Status: AC
  Administered 2017-05-16: 650 mg via ORAL
  Filled 2017-05-16: qty 2

## 2017-05-16 MED ORDER — VANCOMYCIN HCL 10 G IV SOLR
2000.0000 mg | Freq: Once | INTRAVENOUS | Status: DC
  Filled 2017-05-16: qty 2000

## 2017-05-16 MED ORDER — LIDOCAINE-PRILOCAINE 2.5-2.5 % EX CREA: 1.0000 "application " | TOPICAL_CREAM | CUTANEOUS | Status: DC | PRN

## 2017-05-16 MED ORDER — PENTAFLUOROPROP-TETRAFLUOROETH EX AERO: 1.0000 "application " | INHALATION_SPRAY | CUTANEOUS | Status: DC | PRN

## 2017-05-16 MED ORDER — ONDANSETRON HCL 4 MG PO TABS
4.0000 mg | ORAL_TABLET | Freq: Four times a day (QID) | ORAL | Status: DC | PRN
Start: 2017-05-16 — End: 2017-05-25

## 2017-05-16 MED ORDER — HEPARIN SODIUM (PORCINE) 5000 UNIT/ML IJ SOLN: 5000.0000 [IU] | Freq: Three times a day (TID) | INTRAMUSCULAR | Status: DC

## 2017-05-16 MED ORDER — VANCOMYCIN HCL IN DEXTROSE 1-5 GM/200ML-% IV SOLN
1000.0000 mg | Freq: Once | INTRAVENOUS | Status: AC
  Administered 2017-05-16: 1000 mg via INTRAVENOUS
  Filled 2017-05-16: qty 200

## 2017-05-16 MED ORDER — VANCOMYCIN HCL IN DEXTROSE 1-5 GM/200ML-% IV SOLN: 1000.0000 mg | Freq: Once | INTRAVENOUS | Status: DC

## 2017-05-16 MED ORDER — SODIUM CHLORIDE 0.9 % IV SOLN: 100.0000 mL | INTRAVENOUS | Status: DC | PRN

## 2017-05-16 MED ORDER — INSULIN ASPART 100 UNIT/ML ~~LOC~~ SOLN
0.0000 [IU] | Freq: Every day | SUBCUTANEOUS | Status: DC
  Administered 2017-05-16: 2 [IU] via SUBCUTANEOUS

## 2017-05-16 MED ORDER — DOXERCALCIFEROL 4 MCG/2ML IV SOLN: 6.0000 ug | INTRAVENOUS | Status: DC

## 2017-05-16 MED ORDER — HEPARIN SODIUM (PORCINE) 1000 UNIT/ML DIALYSIS
6000.0000 [IU] | INTRAMUSCULAR | Status: DC | PRN
  Administered 2017-05-16: 6000 [IU] via INTRAVENOUS_CENTRAL
  Filled 2017-05-16 (×2): qty 6

## 2017-05-16 MED ORDER — CINACALCET HCL 30 MG PO TABS: 90.0000 mg | ORAL_TABLET | Freq: Every day | ORAL | Status: DC

## 2017-05-16 MED ORDER — VANCOMYCIN HCL IN DEXTROSE 1-5 GM/200ML-% IV SOLN
1000.0000 mg | Freq: Once | INTRAVENOUS | Status: AC
Start: 2017-05-16 — End: 2017-05-16
  Administered 2017-05-16: 1000 mg via INTRAVENOUS

## 2017-05-16 MED ORDER — FERRIC CITRATE 1 GM 210 MG(FE) PO TABS: 2000.0000 mg | ORAL_TABLET | ORAL | Status: DC

## 2017-05-16 MED ORDER — CINACALCET HCL 30 MG PO TABS
120.0000 mg | ORAL_TABLET | Freq: Every day | ORAL | Status: DC
  Administered 2017-05-17 – 2017-05-19 (×3): 120 mg via ORAL
  Filled 2017-05-16 (×4): qty 4

## 2017-05-16 MED ORDER — DEXTROSE 5 % IV SOLN
2.0000 g | INTRAVENOUS | Status: DC
  Administered 2017-05-16 – 2017-05-18 (×2): 2 g via INTRAVENOUS
  Filled 2017-05-16 (×4): qty 2

## 2017-05-16 MED ORDER — VANCOMYCIN HCL IN DEXTROSE 1-5 GM/200ML-% IV SOLN
1000.0000 mg | INTRAVENOUS | Status: DC
  Administered 2017-05-18 – 2017-05-25 (×4): 1000 mg via INTRAVENOUS
  Filled 2017-05-16 (×4): qty 200

## 2017-05-16 MED ORDER — HEPARIN SODIUM (PORCINE) 1000 UNIT/ML DIALYSIS: 1000.0000 [IU] | INTRAMUSCULAR | Status: DC | PRN

## 2017-05-16 MED ORDER — DOXERCALCIFEROL 4 MCG/2ML IV SOLN
INTRAVENOUS | Status: AC
  Administered 2017-05-16: 6 ug via INTRAVENOUS
  Filled 2017-05-16: qty 4

## 2017-05-16 MED ORDER — VANCOMYCIN HCL IN DEXTROSE 1-5 GM/200ML-% IV SOLN
INTRAVENOUS | Status: AC
  Administered 2017-05-16: 1000 mg via INTRAVENOUS
  Filled 2017-05-16: qty 200

## 2017-05-16 MED ORDER — INSULIN ASPART 100 UNIT/ML ~~LOC~~ SOLN
0.0000 [IU] | Freq: Three times a day (TID) | SUBCUTANEOUS | Status: DC
  Administered 2017-05-17: 3 [IU] via SUBCUTANEOUS
  Administered 2017-05-17 – 2017-05-19 (×4): 2 [IU] via SUBCUTANEOUS
  Administered 2017-05-19 – 2017-05-20 (×2): 1 [IU] via SUBCUTANEOUS
  Administered 2017-05-21: 2 [IU] via SUBCUTANEOUS
  Administered 2017-05-21 – 2017-05-22 (×3): 1 [IU] via SUBCUTANEOUS
  Administered 2017-05-22 – 2017-05-23 (×3): 2 [IU] via SUBCUTANEOUS
  Administered 2017-05-24 (×2): 1 [IU] via SUBCUTANEOUS
  Administered 2017-05-24: 2 [IU] via SUBCUTANEOUS
  Administered 2017-05-25: 3 [IU] via SUBCUTANEOUS

## 2017-05-16 MED ORDER — DOXERCALCIFEROL 4 MCG/2ML IV SOLN
6.0000 ug | INTRAVENOUS | Status: DC
  Administered 2017-05-16 – 2017-05-25 (×5): 6 ug via INTRAVENOUS
  Filled 2017-05-16 (×4): qty 4

## 2017-05-16 MED ORDER — CARVEDILOL 3.125 MG PO TABS
3.1250 mg | ORAL_TABLET | Freq: Two times a day (BID) | ORAL | Status: DC
  Administered 2017-05-17 – 2017-05-18 (×4): 3.125 mg via ORAL
  Filled 2017-05-16 (×5): qty 1

## 2017-05-16 MED ORDER — LIDOCAINE HCL (PF) 1 % IJ SOLN: 5.0000 mL | INTRAMUSCULAR | Status: DC | PRN

## 2017-05-16 MED ORDER — ACETAMINOPHEN 325 MG PO TABS
650.0000 mg | ORAL_TABLET | Freq: Once | ORAL | Status: AC | PRN
  Administered 2017-05-16: 650 mg via ORAL

## 2017-05-16 MED ORDER — ACETAMINOPHEN 325 MG PO TABS
650.0000 mg | ORAL_TABLET | Freq: Four times a day (QID) | ORAL | Status: DC | PRN
Start: 2017-05-16 — End: 2017-05-25
  Administered 2017-05-17 – 2017-05-19 (×4): 650 mg via ORAL
  Filled 2017-05-16 (×6): qty 2

## 2017-05-16 MED ORDER — ONDANSETRON HCL 4 MG/2ML IJ SOLN: 4.0000 mg | Freq: Four times a day (QID) | INTRAMUSCULAR | Status: DC | PRN

## 2017-05-16 MED ORDER — ACETAMINOPHEN 650 MG RE SUPP: 650.0000 mg | Freq: Four times a day (QID) | RECTAL | Status: DC | PRN

## 2017-05-16 MED ORDER — HEPARIN SODIUM (PORCINE) 5000 UNIT/ML IJ SOLN: 5000.0000 [IU] | Freq: Three times a day (TID) | INTRAMUSCULAR | Status: AC

## 2017-05-16 NOTE — Assessment & Plan Note (Signed)
By his report his FSG have been failry well controlled.  Will f/u with PCP.

## 2017-05-16 NOTE — Progress Notes (Signed)
Patient ID: [name] OMARY Sr., male   DOB: 27-May-1951, 66 y.o.   MRN: [id number]   LOS: 0 days   Subjective: [name] comes in at the recommendation of ID who he saw in the office today. He had an ulceration on the lateral aspect of his left foot that has been present for about 4 months. He's been seen at the wound clinic weekly for this. It began to get worse a couple of days ago with foul odor, discharge, and some mild pain with weight bearing. X-rays showed osteo of 5th MT and orthopedic surgery was consulted.   Objective: Vital signs in last 24 hours: Temp:  [98.5 F (36.9 C)-101.2 F (38.4 C)] 101.2 F (38.4 C) (05/21 1223) Pulse Rate:  [76-83] 76 (05/21 1430) Resp:  [14-18] 14 (05/21 1430) BP: (114-154)/(64-72) 139/64 (05/21 1430) SpO2:  [95 %-98 %] 95 % (05/21 1430) Weight:  [96.2 kg (212 lb)] 96.2 kg (212 lb) (05/21 0844)     Laboratory  CBC  Recent Labs  05/16/17 1422  WBC 11.2*  HGB 12.4*  HCT 37.8*  PLT 268   BMET  Recent Labs  05/16/17 1422  NA 135  K 5.0  CL 95*  CO2 25  GLUCOSE 142*  BUN 67*  CREATININE 13.82*  CALCIUM 9.3     Physical Exam General appearance: alert and no distress  RLE No traumatic wounds, ecchymosis, or rash, Toes surgically absent  Nontender  No effusions  Knee stable to varus/ valgus and anterior/posterior stress  Sens DPN, SPN, TN absent  Motor EHL, ext, flex, evers 5/5  DP 2+, PT 2+, No significant edema   LLE No traumatic wounds, ecchymosis, or rash  Superficial ulcer on dorsum, deep ulcer on lateral aspect of foot (see pics) with odor and purulent discharge  Nontender  No effusions  Knee stable to varus/ valgus and anterior/posterior stress  Sens DPN, SPN, TN absent  Motor EHL, ext, flex, evers 5/5  DP 2+, PT 2+, No significant edema    Assessment/Plan: Left foot ulcers with underlying osteomyelitis of 5th MT -- Pt well known to Dr. [name]. Will need admission by IM and MRI. [place] for diet tonight. Dr. [name]  will see tomorrow and provide recommendations once MRI is obtained.    Lisette Abu, PA-C Orthopedic Surgery 05/16/2017

## 2017-05-16 NOTE — ED Triage Notes (Signed)
Pt sts wound to left ankle with increased drainage and pain; pt sts fever today

## 2017-05-16 NOTE — ED Notes (Signed)
Attempted report to the floor unsuccessfully.

## 2017-05-16 NOTE — H&P (Signed)
History and Physical    [name] STAFFA Sr. HYH:[id number] DOB: 10/10/1951 DOA: 05/16/2017  PCP: [name] Cowman, MD Patient coming from: home  Chief Complaint: left foot pain  HPI: [name] SHIFFLER Sr. is a pleasant 66 y.o. male with medical history significant for diabetes type 2 end-stage renal disease Monday Wednesday Friday dialysis schedule, CAD, anemia, hypertension, systolic heart failure presents to the emergency department from infectious disease clinic with the chief complaint of worsening left foot pain/increased drainage from a nonhealing foot ulcers. Initial evaluation concerning for osteomyelitis.  Information is obtained from the chart and the patient. He states he had developed ulcers on the lateral aspect of his left foot after having his fourth toe amputated. For the last 3 months he has received vancomycin and cefazolin at dialysis and was recently transitioned to doxycycline per the infectious disease clinic. Today he went to the infectious disease clinic complaining of worsening drainage and is left foot. Associated symptoms include subjective fever chills nausea one episode of vomiting. He denies any coffee ground emesis. He denies any swelling erythema of that foot. He denies any chest pain palpitation shortness of breath lower extremity edema.    ED Course: In the emergency department he is a temperature of 101.2, he is hemodynamically stable and not hypoxic. He is nontoxic appearing. He is evaluated by orthopedic surgery who recommended admission and MRI  Review of Systems: As per HPI otherwise all other systems reviewed and are negative.   Ambulatory Status: He relates independently no recent falls  Past Medical History:  Diagnosis Date  . Cardiomyopathy ([place])   . Diabetes mellitus   . Diabetic foot ulcer ([place])   . Diabetic infection of right foot ([place])   . Diabetic peripheral neuropathy ([place])   . ESRD (end stage renal disease) ([place])   . Headache   . HTN  (hypertension)   . Osteomyelitis ([place])   . Peripheral vascular disease ([place])    critical limb ischemia    Past Surgical History:  Procedure Laterality Date  . AMPUTATION TOE Right 01/21/2017   Procedure: AMPUTATION 2ND TOE RIGHT FOOT ;  Surgeon: [name] Leitz, MD;  Location: [facility];  Service: Orthopedics;  Laterality: Right;  . AV FISTULA PLACEMENT Left 11/27/2014   Procedure: ARTERIOVENOUS (AV) FISTULA CREATION;  Surgeon: [name] Hemlock, MD;  Location: [facility];  Service: Vascular;  Laterality: Left;  . CHOLECYSTECTOMY    . COLONOSCOPY N/A 11/28/2014   Procedure: COLONOSCOPY;  Surgeon: [name] Artist, MD;  Location: [facility] ENDOSCOPY;  Service: Endoscopy;  Laterality: N/A;  . ESOPHAGOGASTRODUODENOSCOPY N/A 11/28/2014   Procedure: ESOPHAGOGASTRODUODENOSCOPY (EGD);  Surgeon: [name] Artist, MD;  Location[facility] ENDOSCOPY;  Service: Endoscopy;  Laterality: N/A;  . I&D EXTREMITY  01/28/2012   Procedure: IRRIGATION AND DEBRIDEMENT EXTREMITY;  Surgeon: [name] Floor, MD;  Location: [facility] ORS;  Service: Orthopedics;  Laterality: Left;  irrigation and drainage left hand  . I&D EXTREMITY  01/30/2012   Procedure: IRRIGATION AND DEBRIDEMENT EXTREMITY;  Surgeon: [name] Floor, MD;  Location: [facility];  Service: Orthopedics;  Laterality: Left;  flexor teno synovectomy and extensor tenosynovectomy arthrosynovectomy wristjoint  . I&D EXTREMITY  02/01/2012   Procedure: IRRIGATION AND DEBRIDEMENT EXTREMITY;  Surgeon: [name] Floor, MD;  Location: [facility];  Service: Orthopedics;  Laterality: Left;  Left Wrist, Hand and Forearm  . IRRIGATION AND DEBRIDEMENT FOOT Left 01/21/2017   Procedure: DEBRIDEMENT LEFT PLANTAR WOUND;  Surgeon: [name] Leitz, MD;  Location: [place] [facility];  Service: Orthopedics;  Laterality: Left;  . Left 4th toe amputation    . LEFT AND RIGHT HEART CATHETERIZATION WITH CORONARY ANGIOGRAM N/A 11/26/2014   Procedure: LEFT AND RIGHT HEART CATHETERIZATION WITH CORONARY ANGIOGRAM;  Surgeon: [name] Man, MD;   Location: [facility] CATH LAB;  Service: Cardiovascular;  Laterality: N/A;  . TONSILLECTOMY      Social History   Social History  . Marital status: Married    Spouse name: [name]  . Number of children: 3  . Years of education: 11   Occupational History  . Maintenance Technician [employer]   Social History Main Topics  . Smoking status: Former Smoker    Packs/day: 0.50    Years: 4.00    Types: Cigarettes  . Smokeless tobacco: Never Used  . Alcohol use No  . Drug use: No  . Sexual activity: Yes   Other Topics Concern  . Not on file   Social History Narrative  . No narrative on file    Allergies  Allergen Reactions  . Penicillins Hives and Swelling    Ankle swelling Has patient had a PCN reaction causing immediate rash, facial/tongue/throat swelling, SOB or lightheadedness with hypotension: yes Has patient had a PCN reaction causing severe rash involving mucus membranes or skin necrosis: no Has patient had a PCN reaction that required hospitalization: no Has patient had a PCN reaction occurring within the last 10 years: no If all of the above answers are "NO", then may proceed with Cephalosporin use.    Family History  Problem Relation Age of Onset  . Heart disease Mother   . Diabetes Father   . Diabetes Brother   . Diabetes Sister   . Diabetes Brother   . Diabetes Brother     Prior to Admission medications   Medication Sig Start Date End Date Taking? Authorizing Provider  acetaminophen (TYLENOL) 325 MG tablet Take 650 mg by mouth every 6 (six) hours as needed (pain).     [provider]  Ascorbic Acid (VITAMIN C PO) Take 1 tablet by mouth daily.    [provider]  carvedilol (COREG) 3.125 MG tablet Take 1 tablet (3.125 mg total) by mouth 2 (two) times daily. 01/10/17   Almyra Deforest, PA  cinacalcet (SENSIPAR) 90 MG tablet Take 90 mg by mouth daily with breakfast.    [provider]  Ferric Citrate (AURYXIA) 1 GM 210 MG(Fe) TABS Take 2-3 g  by mouth See admin instructions. Take 2-3 tablets (2-3 g) by mouth 3 times daily with meals - dosage is based on phosphorus levels provided by dialysis staff.    [provider]  glucose monitoring kit (FREESTYLE) monitoring kit 1 each by Does not apply route 4 (four) times daily - after meals and at bedtime. 1 month Diabetic Testing Supplies for QAC-QHS accuchecks.Any brand OK 11/29/14   Thurnell Lose, MD  HYDROcodone-acetaminophen (NORCO/VICODIN) 5-325 MG tablet Take 1-2 tablets by mouth every 6 (six) hours as needed for moderate pain. 01/21/17   Gary Fleet, PA-C  insulin aspart (NOVOLOG) 100 UNIT/ML injection Before each meal 3 times a day, 140-199 - 2 units, 200-250 - 4 units, 251-299 - 6 units,  300-349 - 8 units,  350 or above 10 units. Insulin PEN if approved, provide syringes and needles if needed. Patient taking differently: Inject 2-5 Units into the skin 3 (three) times daily as needed for high blood sugar (CBg >168). Per sliding scale 11/29/14   Thurnell Lose, MD  Insulin Syringe-Needle U-100 25G X 1" 1 ML MISC Any brand, for 4 times a day insulin SQ, 1 month supply. 11/29/14   Thurnell Lose, MD  multivitamin (RENA-VIT) TABS tablet Take 1 tablet by mouth at bedtime. Patient not taking: Reported on 05/16/2017 11/29/14   Thurnell Lose, MD  mupirocin ointment (BACTROBAN) 2 % Apply 1 application topically 2 (two) times daily as needed (rash on stomach).  08/05/16   [provider]  vancomycin (VANCOCIN) 1-5 GM/200ML-% SOLN Inject 200 mLs (1,000 mg total) into the vein every Monday, Wednesday, and Friday with hemodialysis. Patient not taking: Reported on 05/16/2017 09/15/16   Velvet Bathe, MD    Physical Exam: Vitals:   05/16/17 1223 05/16/17 1430 05/16/17 1529  BP: 114/65 139/64   Pulse: 83 76 78  Resp: _0 Temp: (!) 101.2 F (38.4 C)    TempSrc: Oral    SpO2: 98% 95% 99%     General:  Appears calm and comfortable, sitting up in bed no acute  distress Eyes:  PERRL, EOMI, normal lids, iris ENT:  grossly normal hearing, lips & tongue, mmm Neck:  no LAD, masses or thyromegaly Cardiovascular:  RRR, no m/r/g. No LE edema.  Respiratory:  CTA bilaterally, no w/r/r. Normal respiratory effort. Abdomen:  soft, positive bowel sounds no guarding or rebounding Skin:  Left lateral foot deep open wound small amount of clear drainage bowel odor, up of left foot at base of ankle ulcer not open.  Musculoskeletal:  grossly normal tone BUE/BLE, good ROM, no bony abnormality Psychiatric:  grossly normal mood and affect, speech fluent and appropriate, AOx3 Neurologic:  CN 2-12 grossly intact, moves all extremities in coordinated fashion, sensation intact  Labs on Admission: I have personally reviewed following labs and imaging studies  CBC:  Recent Labs Lab 05/16/17 1422  WBC 11.2*  NEUTROABS 7.7  HGB 12.4*  HCT 37.8*  MCV 94.0  PLT 916   Basic Metabolic Panel:  Recent Labs Lab 05/16/17 1422  NA 135  K 5.0  CL 95*  CO2 25  GLUCOSE 142*  BUN 67*  CREATININE 13.82*  CALCIUM 9.3   GFR: Estimated Creatinine Clearance: 6.5 mL/min (A) (by C-G formula based on SCr of 13.82 mg/dL (H)). Liver Function Tests: No results for input(s): AST, ALT, ALKPHOS, BILITOT, PROT, ALBUMIN in the last 168 hours. No results for input(s): LIPASE, AMYLASE in the last 168 hours. No results for input(s): AMMONIA in the last 168 hours. Coagulation Profile: No results for input(s): INR, PROTIME in the last 168 hours. Cardiac Enzymes: No results for input(s): CKTOTAL, CKMB, CKMBINDEX, TROPONINI in the last 168 hours. BNP (last 3 results) No results for input(s): PROBNP in the last 8760 hours. HbA1C: No results for input(s): HGBA1C in the last 72 hours. CBG: No results for input(s): GLUCAP in the last 168 hours. Lipid Profile: No results for input(s): CHOL, HDL, LDLCALC, TRIG, CHOLHDL, LDLDIRECT in the last 72 hours. Thyroid Function Tests: No results  for input(s): TSH, T4TOTAL, FREET4, T3FREE, THYROIDAB in the last 72 hours. Anemia Panel: No results for input(s): VITAMINB12, FOLATE, FERRITIN, TIBC, IRON, RETICCTPCT in the last 72 hours. Urine analysis:    Component Value Date/Time   COLORURINE YELLOW 09/11/2016 1933   APPEARANCEUR CLEAR 09/11/2016 1933   LABSPEC 1.015 09/11/2016 1933   PHURINE 8.5 (H) 09/11/2016 1933   GLUCOSEU 100 (A) 09/11/2016 1933   HGBUR NEGATIVE 09/11/2016 1933   BILIRUBINUR SMALL (A) 09/11/2016 [place] NEGATIVE 09/11/2016  1933   PROTEINUR 100 (A) 09/11/2016 1933   UROBILINOGEN 0.2 11/22/2014 1740   NITRITE NEGATIVE 09/11/2016 1933   LEUKOCYTESUR NEGATIVE 09/11/2016 1933    Creatinine Clearance: Estimated Creatinine Clearance: 6.5 mL/min (A) (by C-G formula based on SCr of 13.82 mg/dL (H)).  Sepsis Labs: _0 (procalcitonin:4,lacticidven:4) ) Recent Results (from the past 240 hour(s))  Aerobic/Anaerobic Culture (surgical/deep wound)     Status: Abnormal   Collection Time: 05/10/17 11:15 AM  Result Value Ref Range Status   Specimen Description FOOT DEEP TISSUE CULTURE, LEFT LATERAL FOOT  Final   Special Requests NONE  Final   Gram Stain   Final    FEW WBC PRESENT, PREDOMINANTLY PMN FEW GRAM POSITIVE COCCI FEW GRAM NEGATIVE RODS    Culture (A)  Final    MULTIPLE ORGANISMS PRESENT, NONE PREDOMINANT MODERATE BACTEROIDES SPECIES BETA LACTAMASE POSITIVE Performed at [facility], [place] [address], [place], [place] [address]    Report Status 05/15/2017 FINAL  Final  Wound or Superficial Culture     Status: None (Preliminary result)   Collection Time: 05/16/17  2:26 PM  Result Value Ref Range Status   Specimen Description WOUND ULCER  Final   Special Requests NONE  Final   Gram Stain   Final    MODERATE WBC PRESENT, PREDOMINANTLY PMN FEW GRAM POSITIVE COCCI RARE GRAM NEGATIVE RODS    Culture PENDING  Incomplete   Report Status PENDING  Incomplete     Radiological Exams on  Admission: Dg Foot Complete Left  Result Date: 05/16/2017 CLINICAL DATA:  Left foot soft tissue wound with drainage and pain and fevers, initial encounter EXAM: LEFT FOOT - COMPLETE 3+ VIEW COMPARISON:  05/03/2017 FINDINGS: Degenerative changes at first MTP joint are noted. Soft tissue defect is again noted at the distal aspect of the fifth toe as well as laterally in the foot. There are some erosive changes identified at the base of the fifth metatarsal which are new from the prior exam consistent with osteomyelitis. Chronic changes in the heads of the second and fourth metatarsals are seen. Changes of prior fourth toe amputation are again noted. IMPRESSION: New area of lucency at the base of the fifth metatarsal. This is consistent with underlying osteomyelitis. Electronically Signed   By: Inez Catalina M.D.   On: 05/16/2017 14:14    EKG:   Assessment/Plan Principal Problem:   Osteomyelitis of left foot (HCC) Active Problems:   Anemia due to end stage renal disease (HCC)   Cardiomegaly   Benign essential HTN   Congestive dilated cardiomyopathy ([place])   ESRD on dialysis ([place])   Peripheral arterial disease ([place])   #1. Osteomyelitis of the left foot. Nonhealing left foot ulcers. History of osteomyelitis of fifth metatarsal. History of osteomyelitis on the right foot. He received antibiotics via hemodialysis for several weeks coordinated by infectious disease. Patient with mild leukocytosis max temp 101.2 lactic acid within the limits of normal. He is hemodynamically stable and not hypoxic nontoxic appearing. Evaluated by orthopedic surgery in the emergency department who recommends medicine admit an MRI. Dr. [name] will see in the morning -Admit to medical [name] -Vancomycin and ceftaz per pharmacy -Supportive therapy -MRI -Nothing by mouth past midnight -Obtain an EKG, PT/INR  #2. End-stage renal disease he is a Monday Wednesday Friday dialysis patient. He did not attend dialysis today.  Creatinine 13.8 on admission. Chart review indicates his little above his baseline. -Dialysis per nephrology today -Continue home meds -Appreciate assistance of nephrology  #3. Hypertension.  Fair control in the emergency department. Home medications include Coreg -Continue home meds -Monitor closely  #4. Diabetes. Serum glucose 142 on admission. He is on insulin at home. -Obtain a hemoglobin A1c -Sliding scale insulin for optimal control  #5. Anemia. Likely related to chronic disease. Hemoglobin stable  #6. Congestive dilated cardiomyopathy. Last echo 2016 with an EF of 45% grade 1 diastolic dysfunction mild LVH. No signs of overload.  #7. Peripheral arterial disease. Evaluated in January by Dr. Donnetta Hutching. Note indicates patient does not have any evidence of arterial insufficiency extending down into his foot bilaterally. Note indicates he clearly has microvascular disease into his toes related to his diabetes and renal failure. Note opines patient should have adequate flow for healing amputations as planned at that time by orthopedics.    DVT prophylaxis: heparin Code Status: full Family Communication: none present Disposition Plan: home  Consults called: [facility]  Admission status: inpatient    Radene Gunning MD [facility]  If 7PM-7AM, please contact night-coverage [email or web address] Password [email or web address]  05/16/2017, 4:02 PM

## 2017-05-16 NOTE — ED Provider Notes (Signed)
[facility] DEPT Provider Note   CSN: [id number] Arrival date & time: 05/16/17  1213     History   Chief Complaint Chief Complaint  Patient presents with  . Wound Check  . Fever    HPI [name] WINDT Sr. is a 66 y.o. male.  The history is provided by the patient and medical records.  Wound Check   Fever      65 year old male with history of diabetes with subsequent foot ulcers, peripheral neuropathy, end-stage renal disease on hemodialysis, hypertension, peripheral vascular disease, history of osteomyelitis of right foot, hypertension, presenting to the ED for wounds of the left foot. Patient reports he has had blisters on his left foot for quite some time now. He reports these appeared after he had his left fourth toe amputated a while back. States wounds on the foot have been waxing and waning in severity.  States he has been on several courses of antibiotics, actually just finished antibiotics in March. States he thought he was doing well. He has been attending wound care clinic every Tuesday. States over the past 5 days he has had worsening, aching pain in his left foot.   States she is also had some fever and chills. States he went to follow-up at infectious disease clinic today and was sent here for further evaluation. He usually dialyzes on Monday, Wednesday, and Friday, but he called to let clinic no he would not begin due to hospital visit.  He denies any chest pain or shortness of breath at present.  Past Medical History:  Diagnosis Date  . Cardiomyopathy ([place])   . Diabetes mellitus   . Diabetic foot ulcer ([place])   . Diabetic infection of right foot ([place])   . Diabetic peripheral neuropathy ([place])   . ESRD (end stage renal disease) ([place])   . Headache   . HTN (hypertension)   . Osteomyelitis ([place])   . Peripheral vascular disease ([place])    critical limb ischemia    Patient Active Problem List   Diagnosis Date Noted  . Osteomyelitis of right foot ([place]) 02/03/2017    . Type 2 diabetes mellitus with left diabetic foot ulcer ([place]) 01/21/2017  . Osteomyelitis of left foot ([place]) 09/11/2016  . Peripheral arterial disease ([place]) 08/12/2015  . Paroxysmal supraventricular tachycardia ([place]) 01/30/2015  . Occult blood in stools 11/25/2014  . Acute systolic heart failure ([place]) 11/25/2014  . ESRD on dialysis ([place]) 11/25/2014  . Congestive dilated cardiomyopathy ([place]) 11/24/2014  . Visual changes 11/23/2014  . Cardiomegaly 11/23/2014  . Benign essential HTN 11/23/2014  . 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 Anemia due to chronic blood loss 11/23/2014  . Anemia due to end stage renal disease ([place]) 11/22/2014  . Symptomatic anemia   . Cellulitis and abscess of hand 01/28/2012  . HTN (hypertension) 01/28/2012  . Hyponatremia 01/28/2012  . Normocytic anemia 01/28/2012  . Sinusitis 01/28/2012    Past Surgical History:  Procedure Laterality Date  . AMPUTATION TOE Right 01/21/2017   Procedure: AMPUTATION 2ND TOE RIGHT FOOT ;  Surgeon: Dorna Leitz, MD;  Location: [facility];  Service: Orthopedics;  Laterality: Right;  . AV FISTULA PLACEMENT Left 11/27/2014   Procedure: ARTERIOVENOUS (AV) FISTULA CREATION;  Surgeon: Conrad [place], MD;  Location: [facility];  Service: Vascular;  Laterality: Left;  . CHOLECYSTECTOMY    . COLONOSCOPY N/A 11/28/2014   Procedure: COLONOSCOPY;  Surgeon: Ladene Artist, MD;  Location: [facility] ENDOSCOPY;  Service: Endoscopy;  Laterality: N/A;  . ESOPHAGOGASTRODUODENOSCOPY N/A 11/28/2014   Procedure: ESOPHAGOGASTRODUODENOSCOPY (EGD);  Surgeon: Ladene Artist, MD;  Location[facility] ENDOSCOPY;  Service: Endoscopy;  Laterality: N/A;  . I&D EXTREMITY  01/28/2012   Procedure: IRRIGATION AND DEBRIDEMENT EXTREMITY;  Surgeon: Paulene Floor, MD;  Location: [facility] ORS;  Service: Orthopedics;  Laterality: Left;  irrigation and drainage left hand  . I&D EXTREMITY  01/30/2012   Procedure: IRRIGATION AND DEBRIDEMENT EXTREMITY;   Surgeon: Paulene Floor, MD;  Location: [facility] ORS;  Service: Orthopedics;  Laterality: Left;  flexor teno synovectomy and extensor tenosynovectomy arthrosynovectomy wristjoint  . I&D EXTREMITY  02/01/2012   Procedure: IRRIGATION AND DEBRIDEMENT EXTREMITY;  Surgeon: Paulene Floor, MD;  Location: [facility];  Service: Orthopedics;  Laterality: Left;  Left Wrist, Hand and Forearm  . IRRIGATION AND DEBRIDEMENT FOOT Left 01/21/2017   Procedure: DEBRIDEMENT LEFT PLANTAR WOUND;  Surgeon: Dorna Leitz, MD;  Location: [place] [place];  Service: Orthopedics;  Laterality: Left;  . Left 4th toe amputation    . LEFT AND RIGHT HEART CATHETERIZATION WITH CORONARY ANGIOGRAM N/A 11/26/2014   Procedure: LEFT AND RIGHT HEART CATHETERIZATION WITH CORONARY ANGIOGRAM;  Surgeon: Leonie Man, MD;  Location[facility] C[facility] LAB;  Service: Cardiovascular;  Laterality: N/A;  . TONSILLECTOMY         Home Medications    Prior to Admission medications   Medication Sig Start Date End Date Taking? Authorizing Provider  acetaminophen (TYLENOL) 325 MG tablet Take 650 mg by mouth every 6 (six) hours as needed (pain).     [provider]  Ascorbic Acid (VITAMIN C PO) Take 1 tablet by mouth daily.    [provider]  carvedilol (COREG) 3.125 MG tablet Take 1 tablet (3.125 mg total) by mouth 2 (two) times daily. 01/10/17   Almyra Deforest, PA  cinacalcet (SENSIPAR) 90 MG tablet Take 90 mg by mouth daily with breakfast.    [provider]  Ferric Citrate (AURYXIA) 1 GM 210 MG(Fe) TABS Take 2-3 g by mouth See admin instructions. Take 2-3 tablets (2-3 g) by mouth 3 times daily with meals - dosage is based on phosphorus levels provided by dialysis staff.    [provider]  glucose monitoring kit (FREESTYLE) monitoring kit 1 each by Does not apply route 4 (four) times daily - after meals and at bedtime. 1 month Diabetic Testing Supplies for QAC-QHS accuchecks.Any brand OK 11/29/14   Thurnell Lose, MD    HYDROcodone-acetaminophen (NORCO/VICODIN) 5-325 MG tablet Take 1-2 tablets by mouth every 6 (six) hours as needed for moderate pain. 01/21/17   Gary Fleet, PA-C  insulin aspart (NOVOLOG) 100 UNIT/ML injection Before each meal 3 times a day, 140-199 - 2 units, 200-250 - 4 units, 251-299 - 6 units,  300-349 - 8 units,  350 or above 10 units. Insulin PEN if approved, provide syringes and needles if needed. Patient taking differently: Inject 2-5 Units into the skin 3 (three) times daily as needed for high blood sugar (CBg >168). Per sliding scale 11/29/14   Thurnell Lose, MD  Insulin Syringe-Needle U-100 25G X 1" 1 ML MISC Any brand, for 4 times a day insulin SQ, 1 month supply. 11/29/14   Thurnell Lose, MD  multivitamin (RENA-VIT) TABS tablet Take 1 tablet by mouth at bedtime. Patient not taking: Reported on 05/16/2017 11/29/14   Thurnell Lose, MD  mupirocin ointment (BACTROBAN) 2 % Apply 1 application topically 2 (two) times daily as needed (rash  on stomach).  08/05/16   [provider]  vancomycin (VANCOCIN) 1-5 GM/200ML-% SOLN Inject 200 mLs (1,000 mg total) into the vein every Monday, Wednesday, and Friday with hemodialysis. Patient not taking: Reported on 05/16/2017 09/15/16   Velvet Bathe, MD    Family History Family History  Problem Relation Age of Onset  . Heart disease Mother   . Diabetes Father   . Diabetes Brother   . Diabetes Sister   . Diabetes Brother   . Diabetes Brother     Social History Social History  Substance Use Topics  . Smoking status: Former Smoker    Packs/day: 0.50    Years: 4.00    Types: Cigarettes  . Smokeless tobacco: Never Used  . Alcohol use No     Allergies   Penicillins   Review of Systems Review of Systems  Constitutional: Positive for fever.  Skin: Positive for wound.  All other systems reviewed and are negative.    Physical Exam Updated Vital Signs BP 114/65 (BP Location: Right Arm)   Pulse 83   Temp (!) 101.2  F (38.4 C) (Oral)   Resp 18   SpO2 98%   Physical Exam  Constitutional: He is oriented to person, place, and time. He appears well-developed and well-nourished.  HENT:  Head: Normocephalic and atraumatic.  Mouth/Throat: Oropharynx is clear and moist.  Eyes: Conjunctivae and EOM are normal. Pupils are equal, round, and reactive to light.  Neck: Normal range of motion.  Cardiovascular: Normal rate, regular rhythm and normal heart sounds.   Pulmonary/Chest: Effort normal and breath sounds normal.  Abdominal: Soft. Bowel sounds are normal.  Musculoskeletal: Normal range of motion.  Deep ulcerative wound of lateral left foot, there is some foul smelling, purulent drainage present; no active bleeding Superficial ulcer on dorsal foot; evidence of prior bleeding but none currently Left 4th toe has been amputated Decreased sensation of both feet at baseline  Neurological: He is alert and oriented to person, place, and time.  Skin: Skin is warm and dry.  Psychiatric: He has a normal mood and affect.  Nursing note and vitals reviewed.      ED Treatments / Results  Labs (all labs ordered are listed, but only abnormal results are displayed) Labs Reviewed  CBC WITH DIFFERENTIAL/PLATELET - Abnormal; Notable for the following:       Result Value   WBC 11.2 (*)    RBC 4.02 (*)    Hemoglobin 12.4 (*)    HCT 37.8 (*)    RDW 15.9 (*)    All other components within normal limits  BASIC METABOLIC PANEL - Abnormal; Notable for the following:    Chloride 95 (*)    Glucose, Bld 142 (*)    BUN 67 (*)    Creatinine, Ser 13.82 (*)    GFR calc non Af Amer 3 (*)    GFR calc Af Amer 4 (*)    All other components within normal limits  AEROBIC CULTURE (SUPERFICIAL SPECIMEN)  I-STAT CG4 LACTIC ACID, ED  I-STAT CG4 LACTIC ACID, ED    EKG  EKG Interpretation None       Radiology Dg Foot Complete Left  Result Date: 05/16/2017 CLINICAL DATA:  Left foot soft tissue wound with drainage and  pain and fevers, initial encounter EXAM: LEFT FOOT - COMPLETE 3+ VIEW COMPARISON:  05/03/2017 FINDINGS: Degenerative changes at first MTP joint are noted. Soft tissue defect is again noted at the distal aspect of the fifth toe as well  as laterally in the foot. There are some erosive changes identified at the base of the fifth metatarsal which are new from the prior exam consistent with osteomyelitis. Chronic changes in the heads of the second and fourth metatarsals are seen. Changes of prior fourth toe amputation are again noted. IMPRESSION: New area of lucency at the base of the fifth metatarsal. This is consistent with underlying osteomyelitis. Electronically Signed   By: Inez Catalina M.D.   On: 05/16/2017 14:14    Procedures Procedures (including critical care time)  Medications Ordered in ED Medications  acetaminophen (TYLENOL) tablet 650 mg (650 mg Oral Given 05/16/17 1227)     Initial Impression / Assessment and Plan / ED Course  I have reviewed the triage vital signs and the nursing notes.  Pertinent labs & imaging results that were available during my care of the patient were reviewed by me and considered in my medical decision making (see chart for details).  66 year old male sent here from infectious disease with concern for worsening left foot wound. Apparently this is been present for several months, waxing and waning in severity. Finish last antibiotics in March. Does have a fever here but is overall nontoxic in appearance. On exam, he has a deep, ulcerative wound of the lateral left foot with foul-smelling, purulent drainage. There is no active bleeding. Also has a superficial ulcer to the top of the left foot that does not appear infected at this time. He has decreased sensation of both feet at baseline which is unchanged.  Based on chart review, patient has been following with Dr. [name] at [place] clinic.  Most recent x-ray on 05/03/17 without evidence of osteomyelitis.  He also had  aerobic/anaerobic culture on 05/10/17 which revealed multiple organisms present.  Will re-check labs here, new culture sent.  Plan for plain films of the foot.  Will need admission.  Labwork with leukocytosis noted. Renal function appears baseline given dialysis status, K+ WNL.  Lactate normal.  X-ray of foot reveals new lucency concerning for osteomyelitis along the 5th metatarsal.  Discussed with orthopedics, they will consult.  Nephrology aware, will assist with facilitating dialysis.  Patient will be admitted to hospitalist service for ongoing care.  Final Clinical Impressions(s) / ED Diagnoses   Final diagnoses:  Other acute osteomyelitis of left foot ([place])  ESRD on hemodialysis [facility])    New Prescriptions New Prescriptions   No medications on file     Larene Pickett, PA-C 05/16/17 [place], [place], MD 05/18/17 [phone]

## 2017-05-16 NOTE — Progress Notes (Signed)
Patient arrived to unit per ED stretcher.  Reviewed treatment plan and this RN agrees.  Report received from bedside RN, Junie Panning.  Consent obtained.  Patient A & o x 4. Lung sounds clear to ausculation in all fields. No edema. Cardiac: NSR.  Prepped LUAVF with alcohol and cannulated with two 15 gauge needles.  Pulsation of blood noted.  Flushed access well with saline per protocol.  Connected and secured lines and initiated tx at 1636.  UF goal of 3800 mL and net fluid removal of 3300 mL.  Will continue to monitor.

## 2017-05-16 NOTE — Progress Notes (Signed)
Dialysis treatment completed.  3800 mL ultrafiltrated and net fluid removal 3300 mL.    Patient status unchanged. Lung sounds clear to ausculation in all fields. No edema. Cardiac: NSR.  Disconnected lines and removed needles.  Pressure held for 10 minutes and band aid/gauze dressing applied.  Report given to bedside RN, Jimmie Molly.

## 2017-05-16 NOTE — Consult Note (Signed)
[facility] Consultation Note Requesting Physician:  [facility] Primary Nephrologist: [place] ([place] [facility]) Reason for Consult:  Provision of HD and related services for ESRD patient  HPI: The patient is a 65 y.o. year-old AAM w/ESRD, DM2, HTN, NICM with sCHF. Has had issues with foot ulcers with osteo of R foot earlier this year treated with V/F/hyperbaric O2. Current admission is for ulceration lateral aspect LEFT foot with associated chills, temp today to 102, foot pain/drainage/foul odor. Plain film with new lucency base of L 5th metatarsal c/w osteo. Ortho (Dr. Graves) will be seeing tomorrow, Dr. Hatcher has recommended vanco/fortaz (pharmacy dosing). Today is usual HD day and we were asked to see for that.  Past Medical History:  Diagnosis Date  . Cardiomyopathy ([facility])   . Diabetes mellitus   . Diabetic foot ulcer ([facility])   . Diabetic infection of right foot ([facility])   . Diabetic peripheral neuropathy ([facility])   . ESRD (end stage renal disease) ([facility])   . Headache   . HTN (hypertension)   . Osteomyelitis ([facility])   . Peripheral vascular disease ([facility])    critical limb ischemia    Past Surgical History:  Procedure Laterality Date  . AMPUTATION TOE Right 01/21/2017   Procedure: AMPUTATION 2ND TOE RIGHT FOOT ;  Surgeon: John Graves, MD;  Location: [facility] OR;  Service: Orthopedics;  Laterality: Right;  . AV FISTULA PLACEMENT Left 11/27/2014   Procedure: ARTERIOVENOUS (AV) FISTULA CREATION;  Surgeon: Brian L Chen, MD;  Location: [facility] OR;  Service: Vascular;  Laterality: Left;  . CHOLECYSTECTOMY    . COLONOSCOPY N/A 11/28/2014   Procedure: COLONOSCOPY;  Surgeon: Malcolm T Stark, MD;  Location: [facility] ENDOSCOPY;  Service: Endoscopy;  Laterality: N/A;  . ESOPHAGOGASTRODUODENOSCOPY N/A 11/28/2014   Procedure: ESOPHAGOGASTRODUODENOSCOPY (EGD);  Surgeon: Malcolm T Stark, MD;  Location: [facility] ENDOSCOPY;  Service: Endoscopy;  Laterality: N/A;  . I&D EXTREMITY  01/28/2012   Procedure: IRRIGATION AND DEBRIDEMENT  EXTREMITY;  Surgeon: William M Gramig III, MD;  Location: [facility];  Service: Orthopedics;  Laterality: Left;  irrigation and drainage left hand  . I&D EXTREMITY  01/30/2012   Procedure: IRRIGATION AND DEBRIDEMENT EXTREMITY;  Surgeon: William M Gramig III, MD;  Location: [facility];  Service: Orthopedics;  Laterality: Left;  flexor teno synovectomy and extensor tenosynovectomy arthrosynovectomy wristjoint  . I&D EXTREMITY  02/01/2012   Procedure: IRRIGATION AND DEBRIDEMENT EXTREMITY;  Surgeon: William M Gramig III, MD;  Location: [facility];  Service: Orthopedics;  Laterality: Left;  Left Wrist, Hand and Forearm  . IRRIGATION AND DEBRIDEMENT FOOT Left 01/21/2017   Procedure: DEBRIDEMENT LEFT PLANTAR WOUND;  Surgeon: John Graves, MD;  Location: [facility] OR;  Service: Orthopedics;  Laterality: Left;  . Left 4th toe amputation    . LEFT AND RIGHT HEART CATHETERIZATION WITH CORONARY ANGIOGRAM N/A 11/26/2014   Procedure: LEFT AND RIGHT HEART CATHETERIZATION WITH CORONARY ANGIOGRAM;  Surgeon: David W Harding, MD;  Location: [facility] CATH LAB;  Service: Cardiovascular;  Laterality: N/A;  . TONSILLECTOMY      Family History  Problem Relation Age of Onset  . Heart disease Mother   . Diabetes Father   . Diabetes Brother   . Diabetes Sister   . Diabetes Brother   . Diabetes Brother    Social History:  reports that he has quit smoking. His smoking use included Cigarettes. He has a 2.00 pack-year smoking history. He has never used smokeless tobacco. He reports that he does not drink alcohol or use drugs.   Allergies    Allergen Reactions  . Penicillins Hives and Swelling    Ankle swelling Has patient had a PCN reaction causing immediate rash, facial/tongue/throat swelling, SOB or lightheadedness with hypotension: yes Has patient had a PCN reaction causing severe rash involving mucus membranes or skin necrosis: no Has patient had a PCN reaction that required hospitalization: no Has patient had a PCN reaction occurring within  the last 10 years: no If all of the above answers are "NO", then may proceed with Cephalosporin use.     Prior to Admission medications   Medication Sig Start Date End Date Taking? Authorizing Provider  acetaminophen (TYLENOL) 325 MG tablet Take 650 mg by mouth every 6 (six) hours as needed (pain).    Yes [provider]  Ascorbic Acid (VITAMIN C PO) Take 1 tablet by mouth daily.   Yes [provider]  carvedilol (COREG) 3.125 MG tablet Take 1 tablet (3.125 mg total) by mouth 2 (two) times daily. 01/10/17  Yes Almyra Deforest, PA  cinacalcet (SENSIPAR) 90 MG tablet Take 90 mg by mouth daily with breakfast.   Yes [provider]  Ferric Citrate (AURYXIA) 1 GM 210 MG(Fe) TABS Take 210 mg by mouth 3 (three) times daily with meals.    Yes [provider]  HYDROcodone-acetaminophen (NORCO/VICODIN) 5-325 MG tablet Take 1-2 tablets by mouth every 6 (six) hours as needed for moderate pain. 01/21/17  Yes Gary Fleet, PA-C  insulin aspart (NOVOLOG) 100 UNIT/ML injection Before each meal 3 times a day, 140-199 - 2 units, 200-250 - 4 units, 251-299 - 6 units,  300-349 - 8 units,  350 or above 10 units. Insulin PEN if approved, provide syringes and needles if needed. Patient taking differently: Inject 2-5 Units into the skin 3 (three) times daily as needed for high blood sugar (CBg >168). Per sliding scale 11/29/14  Yes Thurnell Lose, MD  multivitamin (RENA-VIT) TABS tablet Take 1 tablet by mouth at bedtime. 11/29/14  Yes Thurnell Lose, MD  mupirocin ointment (BACTROBAN) 2 % Apply 1 application topically daily as needed (rash on stomach).  08/05/16  Yes [provider]  glucose monitoring kit (FREESTYLE) monitoring kit 1 each by Does not apply route 4 (four) times daily - after meals and at bedtime. 1 month Diabetic Testing Supplies for QAC-QHS accuchecks.Any brand OK Patient not taking: Reported on 05/16/2017 11/29/14   Thurnell Lose, MD  Insulin Syringe-Needle  U-100 25G X 1" 1 ML MISC Any brand, for 4 times a day insulin SQ, 1 month supply. 11/29/14   Thurnell Lose, MD  vancomycin (VANCOCIN) 1-5 GM/200ML-% SOLN Inject 200 mLs (1,000 mg total) into the vein every Monday, Wednesday, and Friday with hemodialysis. Patient not taking: Reported on 05/16/2017 09/15/16   Velvet Bathe, MD    Inpatient medications: . carvedilol  3.125 mg Oral BID  . cinacalcet  120 mg Oral Q supper  . [START ON 05/18/2017] doxercalciferol  6 mcg Intravenous Q M,W,F-HD  . ferric citrate  1,995-3,045 mg Oral See admin instructions  . heparin  5,000 Units Subcutaneous Q8H  . insulin aspart  0-5 Units Subcutaneous QHS  . insulin aspart  0-9 Units Subcutaneous TID WC    Review of Systems See HPI  Physical Exam:  Blood pressure (!) 136/37, pulse 76, temperature 98.1 F (36.7 C), resp. rate 16, weight 95.8 kg (211 lb 3.2 oz), SpO2 99 %.  Gen: Very nice AAM, NAD. Seen in the HD unit Skin: no rash, cyanosis Neck: no JVD, no  bruits  Chest: Clear to auscultation Heart: Regular rhythm. S1S2 normal. No audible murmur. Abdomen: soft, no focal tenderness Ext: No pretibial edema. Left foot wrapped (photo reviewed from Dr. Algis Downs note). Malodorous.Absent L 4th toe.  Neuro: alert, Ox3, no focal deficit Heme/Lymph: no bruising or LAN Dialysis Access: Left upper arm AVF cannulated for HD   Recent Labs Lab 05/16/17 1422  NA 135  K 5.0  CL 95*  CO2 25  GLUCOSE 142*  BUN 67*  CREATININE 13.82*  CALCIUM 9.3    Recent Labs Lab 05/16/17 1422  WBC 11.2*  NEUTROABS 7.7  HGB 12.4*  HCT 37.8*  MCV 94.0  PLT 268    Xrays/Other Studies: Dg Foot Complete Left  Result Date: 05/16/2017 CLINICAL DATA:  Left foot soft tissue wound with drainage and pain and fevers, initial encounter EXAM: LEFT FOOT - COMPLETE 3+ VIEW COMPARISON:  05/03/2017 FINDINGS: Degenerative changes at first MTP joint are noted. Soft tissue defect is again noted at the distal aspect of the fifth toe  as well as laterally in the foot. There are some erosive changes identified at the base of the fifth metatarsal which are new from the prior exam consistent with osteomyelitis. Chronic changes in the heads of the second and fourth metatarsals are seen. Changes of prior fourth toe amputation are again noted. IMPRESSION: New area of lucency at the base of the fifth metatarsal. This is consistent with underlying osteomyelitis. Electronically Signed   By: Inez Catalina M.D.   On: 05/16/2017 14:14   Dialysis Rx MWF [place] 4.25 hours 425/800 EDW 92.5 kg 2K2.25 Ca L AVF Hectorol 4 mcg Heparin 6000 units (sensipar 60 2/day at home; bider auryxia 2 tid w/meals)  Background:  66 y.o. year-old AAM w/ESRD, DM2, HTN, NICM with sCHF. Has had issues with foot ulcers with osteo of R foot earlier this year treated with V/F/hyperbaric O2. Current admission is for ulceration lateral aspect LEFT foot with associated chills, temp today to 102, foot pain/drainage/foul odor. Plain film with new lucency base of L 5th metatarsal c/w osteo. Admitted for IV ATB's, surgical consultation by Dr. [name]  Assessment/Recommendations  1. Osteomyelitis L fifth metatarsal - Vanco/fortaz per Dr. [name], surgical consultation by Dr. [name] 2. ESRD - MWF [place]. HD today.  3. Secondary HPT - hectorol with HD. Sensipar 120/day. [name]. 2ac 4. DM - insulin per primary team 5. Anemia 2/2 ESRD - no current ESA requirement 6. HTN - usual meds 7. PAD - has had prior evaluation for large vessel ds (Dr. Donnetta Hutching) and felt adequate flow to heal amputations   [name],  MD [place] [phone] pager 05/16/2017, 4:44 PM

## 2017-05-16 NOTE — Assessment & Plan Note (Signed)
Will send him to the hospital for MRI, anbx, and urgent ortho eval.  He states he does not want to lose his foot, I suggested that this may not be possible.  Will f/u in 1 month.

## 2017-05-16 NOTE — Progress Notes (Signed)
Pharmacy Antibiotic Note  [name]. is a 66 y.o. male admitted on 05/16/2017 with osteomyelitis.  Pharmacy has been consulted for vancomycin and ceftazidime dosing. Tmax 101.2, WBC 11.2, patient has ESRD on HD-MWF. He did not get outpatient dialysis today and is on schedule to receive HD inpatient now.  Plan: Vancomycin 2g load today then 1g IV qHD-MWF Ceftazidime 2g IV qMWF after HD F/u HD schedule and tolerability, cultures     Temp (24hrs), Avg:99.9 F (37.7 C), Min:98.5 F (36.9 C), Max:101.2 F (38.4 C)   Recent Labs Lab 05/16/17 1319 05/16/17 1422 05/16/17 1550  WBC  --  11.2*  --   CREATININE  --  13.82*  --   LATICACIDVEN 1.47  --  0.59    Estimated Creatinine Clearance: 6.5 mL/min (A) (by C-G formula based on SCr of 13.82 mg/dL (H)).    Allergies  Allergen Reactions  . Penicillins Hives and Swelling    Ankle swelling Has patient had a PCN reaction causing immediate rash, facial/tongue/throat swelling, SOB or lightheadedness with hypotension: yes Has patient had a PCN reaction causing severe rash involving mucus membranes or skin necrosis: no Has patient had a PCN reaction that required hospitalization: no Has patient had a PCN reaction occurring within the last 10 years: no If all of the above answers are "NO", then may proceed with Cephalosporin use.    Antimicrobials this admission: 5/21 vanc>> 5/21 ceftazidime>>  Microbiology results: 5/15 WCx: multiple organisms 5/21 WCx: pending   Thank you for allowing pharmacy to be a part of this patient's care.   Gwenlyn Perking, PharmD PGY1 Pharmacy Resident Rx ED [phone] 05/16/2017 4:22 PM

## 2017-05-16 NOTE — Progress Notes (Signed)
Subjective:    Patient ID: [name] Sages Sr., male    DOB: Jun 18, 1951, 66 y.o.   MRN: [id number]  HPI 66 yo M with hx of DM2, ESRD, CAD, and ulcers on R first metatarsal head; L 5th and L 1st MT heads. He has had local debridement of callus.  He has previously had R 5th ray-amp.  In March he received a course of vanco/ceftaz at HD for ~ 1 month. He was then transitioned to po doxy.  In July 2017 he recieved Vancomycin due to increasing drainage from his wound. Cx of this was polymicrobial.  He was in hospital 08-2016 and was treated with 6 weeks of vanc/oceftaz for his foot wounds. He was rec to have vascular and surgical evals at that time however pt wished to go home.  He states these anbx healed his wounds.  He has been followed at [facility] and has been getting hyperbaric therapy.  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  His last MRI showed osteo of R foot (Sept 2017).   He saw ortho on 01-03-17 and was recommended to have R 2nd toe amputation. He also has non-healing L foot ulcer.  He called ID in December was and was restarted on vanco/ceftaz (12-10-16).  Plain film L foot 01-04-17 was (-).  States his sugars have been irregular, can't have hyperbaric O2 if his FSG <130.  Non-smoked.   ID visit 1-10 his anbx were continued at HD (ceftaz/vanco)  He was again seen by vascular 1-23 and he has no arterial insufficiency.   Had amputation 1-26 of R 2nd toe, partial 3rd toe. 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 At ID f/u on 2-8 he continued to have wounds wounds on his L foot. He has a new wound on the dorsum of his L foot.   He was seen on 3-12 in ID and his anbx were stopped as he was doing well.   Today he complains of worsening in one area of his foot with a separate area improving. Has had chills last couple of days. Has had significant wound d/c.  Increasing. Clear.  No prosximal erythema, has noted some soreness in his ankle.  Wt bearing is good but more painful.   Sees [facility] every Tuesday.  Last surgery visit was February. Was released. Was instructed to f/u at [facility].  Has been treating with santyl  Finished with HBO  FSG have been 117-190. HD has been going well. BP have been down at times during HD>  Review of Systems Please see HPI. 12 point ROS o/w (-)     Objective:   Physical Exam  Constitutional: He appears well-developed and well-nourished.  HENT:  Mouth/Throat: No oropharyngeal exudate.  Eyes: EOM are normal. Pupils are equal, round, and reactive to light.  Neck: Neck supple.  Cardiovascular: Normal rate, regular rhythm and normal heart sounds.   Pulmonary/Chest: Effort normal and breath sounds normal.  Abdominal: Soft. Bowel sounds are normal. There is no tenderness. There is no rebound.  Musculoskeletal: He exhibits no edema.  Lymphadenopathy:    He has no cervical adenopathy.      Laterally- deep wound with foul smell. No purulence expressed.  On top of foot there is a superficial wound that easily bleeds.  There is mild swelling of his ankle without fluctuance.  Assessment & Plan:

## 2017-05-17 ENCOUNTER — Inpatient Hospital Stay (HOSPITAL_COMMUNITY): Payer: Medicare Other

## 2017-05-17 ENCOUNTER — Other Ambulatory Visit: Payer: Self-pay | Admitting: Orthopedic Surgery

## 2017-05-17 DIAGNOSIS — I5022 Chronic systolic (congestive) heart failure: Secondary | ICD-10-CM

## 2017-05-17 DIAGNOSIS — M86172 Other acute osteomyelitis, left ankle and foot: Secondary | ICD-10-CM

## 2017-05-17 DIAGNOSIS — I132 Hypertensive heart and chronic kidney disease with heart failure and with stage 5 chronic kidney disease, or end stage renal disease: Secondary | ICD-10-CM

## 2017-05-17 DIAGNOSIS — Z833 Family history of diabetes mellitus: Secondary | ICD-10-CM

## 2017-05-17 DIAGNOSIS — Z794 Long term (current) use of insulin: Secondary | ICD-10-CM

## 2017-05-17 DIAGNOSIS — Z8249 Family history of ischemic heart disease and other diseases of the circulatory system: Secondary | ICD-10-CM

## 2017-05-17 DIAGNOSIS — E1122 Type 2 diabetes mellitus with diabetic chronic kidney disease: Secondary | ICD-10-CM

## 2017-05-17 DIAGNOSIS — E1169 Type 2 diabetes mellitus with other specified complication: Principal | ICD-10-CM

## 2017-05-17 DIAGNOSIS — Z880 Allergy status to penicillin: Secondary | ICD-10-CM

## 2017-05-17 DIAGNOSIS — Z87891 Personal history of nicotine dependence: Secondary | ICD-10-CM

## 2017-05-17 DIAGNOSIS — Z89421 Acquired absence of other right toe(s): Secondary | ICD-10-CM

## 2017-05-17 LAB — COMPREHENSIVE METABOLIC PANEL
ALK PHOS: 71 U/L (ref 38–126)
ALT: 9 U/L — AB (ref 17–63)
AST: 13 U/L — ABNORMAL LOW (ref 15–41)
Albumin: 3.2 g/dL — ABNORMAL LOW (ref 3.5–5.0)
Anion gap: 14 (ref 5–15)
BILIRUBIN TOTAL: 0.7 mg/dL (ref 0.3–1.2)
BUN: 33 mg/dL — ABNORMAL HIGH (ref 6–20)
CALCIUM: 9.5 mg/dL (ref 8.9–10.3)
CHLORIDE: 93 mmol/L — AB (ref 101–111)
CO2: 29 mmol/L (ref 22–32)
CREATININE: 9.83 mg/dL — AB (ref 0.61–1.24)
GFR, EST AFRICAN AMERICAN: 6 mL/min — AB (ref >60)
GFR, EST NON AFRICAN AMERICAN: 5 mL/min — AB (ref >60)
Glucose, Bld: 152 mg/dL — ABNORMAL HIGH (ref 65–99)
Potassium: 4.4 mmol/L (ref 3.5–5.1)
Sodium: 136 mmol/L (ref 135–145)
TOTAL PROTEIN: 8 g/dL (ref 6.5–8.1)

## 2017-05-17 LAB — PROTIME-INR
INR: 1.19
PROTHROMBIN TIME: 15.2 s (ref 11.4–15.2)

## 2017-05-17 LAB — GLUCOSE, CAPILLARY
GLUCOSE-CAPILLARY: 153 mg/dL — AB (ref 65–99)
Glucose-Capillary: 153 mg/dL — ABNORMAL HIGH (ref 65–99)
Glucose-Capillary: 187 mg/dL — ABNORMAL HIGH (ref 65–99)
Glucose-Capillary: 163 mg/dL — ABNORMAL HIGH (ref 65–99)

## 2017-05-17 LAB — CBC
HCT: 40.7 % (ref 39.0–52.0)
Hemoglobin: 12.9 g/dL — ABNORMAL LOW (ref 13.0–17.0)
MCH: 30.4 pg (ref 26.0–34.0)
MCHC: 31.7 g/dL (ref 30.0–36.0)
MCV: 96 fL (ref 78.0–100.0)
PLATELETS: 270 10*3/uL (ref 150–400)
RBC: 4.24 MIL/uL (ref 4.22–5.81)
RDW: 16.2 % — ABNORMAL HIGH (ref 11.5–15.5)
WBC: 7.2 10*3/uL (ref 4.0–10.5)

## 2017-05-17 LAB — MRSA PCR SCREENING: MRSA by PCR: NEGATIVE

## 2017-05-17 MED ORDER — FERRIC CITRATE 1 GM 210 MG(FE) PO TABS
210.0000 mg | ORAL_TABLET | Freq: Three times a day (TID) | ORAL | Status: DC
  Administered 2017-05-17 – 2017-05-25 (×19): 210 mg via ORAL
  Filled 2017-05-17 (×26): qty 1

## 2017-05-17 MED ORDER — VANCOMYCIN HCL IN DEXTROSE 1-5 GM/200ML-% IV SOLN: 1000.0000 mg | INTRAVENOUS | Status: DC

## 2017-05-17 MED ORDER — POVIDONE-IODINE 10 % EX SWAB: 2.0000 "application " | Freq: Once | CUTANEOUS | Status: DC

## 2017-05-17 MED ORDER — RENA-VITE PO TABS
1.0000 | ORAL_TABLET | Freq: Every day | ORAL | Status: DC
  Administered 2017-05-17 – 2017-05-24 (×8): 1 via ORAL
  Filled 2017-05-17 (×8): qty 1

## 2017-05-17 MED ORDER — CHLORHEXIDINE GLUCONATE 4 % EX LIQD
60.0000 mL | Freq: Once | CUTANEOUS | Status: AC
  Administered 2017-05-18: 4 via TOPICAL
  Filled 2017-05-17 (×3): qty 60

## 2017-05-17 NOTE — Progress Notes (Signed)
[facility] Progress Note   Subjective: No new c/os. Wife present. Considering options for treatment. HD yesterday felt "wiped out" after Says has decide to go ahead w/amputation - set for 11:45 tomorrow after HD  Objective Vitals:   05/16/17 2036 05/16/17 2038 05/16/17 2104 05/17/17 0526  BP: 139/77 (!) 158/77 (!) 153/71 (!) 115/56  Pulse: 94 91 94 86  Resp: (!) 21  20 18   Temp: 98.6 F (37 C)  99.6 F (37.6 C) 100 F (37.8 C)  TempSrc:   Oral Oral  SpO2:   100% 97%  Weight: 92.5 kg (203 lb 14.8 oz)  91.3 kg (201 lb 4.8 oz)   Height:   6\' 1"  (1.854 m)    Physical Exam General: WNWD male NAD  Pt having chills Heart: RRR Lungs: CTAB  Abdomen: soft NT/ND Extremities: no LE edema L foot wrapped malodorous Dialysis Access: LUE AVF +bruit    Recent Labs Lab 05/16/17 1422  NA 135  K 5.0  CL 95*  CO2 25  GLUCOSE 142*  BUN 67*  CREATININE 13.82*  CALCIUM 9.3    Recent Labs Lab 05/16/17 1422 05/17/17 0805  WBC 11.2* 7.2  NEUTROABS 7.7  --   HGB 12.4* 12.9*  HCT 37.8* 40.7  MCV 94.0 96.0  PLT 268 270       Component Value Date/Time   SDES WOUND ULCER 05/16/2017 1426   SPECREQUEST NONE 05/16/2017 1426   CULT PENDING 05/16/2017 1426   REPTSTATUS PENDING 05/16/2017 1426     Recent Labs Lab 05/16/17 2108 05/17/17 0749  GLUCAP 228* 153*   Medications: . cefTAZidime (FORTAZ)  IV Stopped (05/16/17 2337)  . vancomycin     . carvedilol  3.125 mg Oral BID  . cinacalcet  120 mg Oral Q supper  . doxercalciferol  6 mcg Intravenous Q M,W,F-HD  . ferric citrate  1,995-3,045 mg Oral See admin instructions  . insulin aspart  0-5 Units Subcutaneous QHS  . insulin aspart  0-9 Units Subcutaneous TID WC   Dialysis Orders:  MWF [facility] 4.25 hours 425/800 EDW 92.5 kg 2K2.25 Ca L AVF Hectorol 4 mcg Heparin 6000 units (sensipar 60 2/day at home; bider auryxia 2 tid w/meals)  Background:  66 y.o. year-old [name] w/ESRD, DM2, HTN, NICM with sCHF. Has  had issues with foot ulcers with osteo of R foot earlier this year treated with V/F/hyperbaric O2. Current admission is for ulceration lateral aspect LEFT foot with associated chills, temp today to 102, foot pain/drainage/foul odor. Plain film with new lucency base of L 5th metatarsal c/w osteo. Admitted for IV ATB's, surgical consultation by Dr. [name]  Assessment/Plan: 1. Osteomyelitis L fifth metatarsal -  Vanco/fortaz per Dr. [name], surgical consultation by Dr. [name]; MRI 5/22 - oseto no abscess. Plan is for surgery on 5/23  2. ESRD - MWF  2K bath 1st round tomorrow - no heparin - in anticipation of surgery 3. Secondary HPT - hectorol with HD. Sensipar 120/day. [name]. 2ac 4. DM - insulin per primary team 5. 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 HTN/Volume  - usual meds/ HD Mon net UF 3.3L post wt 91.3kg - got below edw - follow weights no change for now 7. PAD - has had prior evaluation for large vessel ds (Dr. Donnetta Hutching) and felt adequate flow to heal amputations   [name] Earthly PA-C [place] Pager [phone] 05/17/2017,9:14 AM  LOS: 1 day   I have seen and examined this  patient and agree with plan and assessment in the above note with renal recommendations/intervention highlighted. Plan for amputation after HD in AM. ATB's per ID.  Semaj Kham B,MD 05/17/2017 2:37 PM

## 2017-05-17 NOTE — Consult Note (Signed)
[place] Nurse has reviewed record and this patient has a positive xray or MRI for osteomyelitis, this is considered outside of the scope of practice for the [place] nurse, for that reason [place] Nurse will not consult.  MRI 05/16/17:Soft tissue ulcer overlying the base of the fifth metatarsal. 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 No drainable fluid collection to suggest an abscess.  Re-consult if only topical wound care needed after orthopedic or surgical evaluation [place], [place]

## 2017-05-17 NOTE — Consult Note (Signed)
Reason for Consult:open draining woundleft foot lateral aspect Referring Physician: hospitalists  [name] HYNES Sr. is an 66 y.o. male.  HPI: the patient is a 66 year old male with a long history of significant wound issues on his feet.  He is currently on renal dialysis.  He has had open wound over the lateral aspect of his foot for many months.  This is been followed in the wound Center for for many months.  He was evaluated yesterday by infectious disease with an open draining wound over the lateral aspect with foul-smelling drainage.  He is admitted to the hospitalist service for evaluation and antibiotic treatment.  We are consult for possible surgical management.  We have asked her MRI to be obtained to corroborate plain film findings of osteomyelitis of the base of the fifth metatarsal.  Past Medical History:  Diagnosis Date  . Cardiomyopathy ([place])   . Diabetes mellitus   . Diabetic foot ulcer ([place])   . Diabetic infection of right foot ([place])   . Diabetic peripheral neuropathy ([place])   . ESRD (end stage renal disease) ([place])   . Headache   . HTN (hypertension)   . Osteomyelitis ([place])   . Peripheral vascular disease ([place])    critical limb ischemia    Past Surgical History:  Procedure Laterality Date  . AMPUTATION TOE Right 01/21/2017   Procedure: AMPUTATION 2ND TOE RIGHT FOOT ;  Surgeon: Dorna Leitz, MD;  Location: [facility];  Service: Orthopedics;  Laterality: Right;  . AV FISTULA PLACEMENT Left 11/27/2014   Procedure: ARTERIOVENOUS (AV) FISTULA CREATION;  Surgeon: Conrad Bellaire, MD;  Location: [facility];  Service: Vascular;  Laterality: Left;  . CHOLECYSTECTOMY    . COLONOSCOPY N/A 11/28/2014   Procedure: COLONOSCOPY;  Surgeon: Ladene Artist, MD;  Location: [facility] ENDOSCOPY;  Service: Endoscopy;  Laterality: N/A;  . ESOPHAGOGASTRODUODENOSCOPY N/A 11/28/2014   Procedure: ESOPHAGOGASTRODUODENOSCOPY (EGD);  Surgeon: Ladene Artist, MD;  Location: [facility] ENDOSCOPY;  Service: Endoscopy;  Laterality:  N/A;  . I&D EXTREMITY  01/28/2012   Procedure: IRRIGATION AND DEBRIDEMENT EXTREMITY;  Surgeon: Paulene Floor, MD;  Location: [facility];  Service: Orthopedics;  Laterality: Left;  irrigation and drainage left hand  . I&D EXTREMITY  01/30/2012   Procedure: IRRIGATION AND DEBRIDEMENT EXTREMITY;  Surgeon: Paulene Floor, MD;  Location: [facility];  Service: Orthopedics;  Laterality: Left;  flexor teno synovectomy and extensor tenosynovectomy arthrosynovectomy wristjoint  . I&D EXTREMITY  02/01/2012   Procedure: IRRIGATION AND DEBRIDEMENT EXTREMITY;  Surgeon: Paulene Floor, MD;  Location: [facility];  Service: Orthopedics;  Laterality: Left;  Left Wrist, Hand and Forearm  . IRRIGATION AND DEBRIDEMENT FOOT Left 01/21/2017   Procedure: DEBRIDEMENT LEFT PLANTAR WOUND;  Surgeon: Dorna Leitz, MD;  Location: [facility];  Service: Orthopedics;  Laterality: Left;  . Left 4th toe amputation    . LEFT AND RIGHT HEART CATHETERIZATION WITH CORONARY ANGIOGRAM N/A 11/26/2014   Procedure: LEFT AND RIGHT HEART CATHETERIZATION WITH CORONARY ANGIOGRAM;  Surgeon: Leonie Man, MD;  Location: [facility] CATH LAB;  Service: Cardiovascular;  Laterality: N/A;  . TONSILLECTOMY      Family History  Problem Relation Age of Onset  . Heart disease Mother   . Diabetes Father   . Diabetes Brother   . Diabetes Sister   . Diabetes Brother   . Diabetes Brother     Social History:  reports that he has quit smoking. His smoking use included Cigarettes. He has a 2.00 pack-year  smoking history. He has never used smokeless tobacco. He reports that he does not drink alcohol or use drugs.  Allergies:  Allergies  Allergen Reactions  . Penicillins Hives and Swelling    Ankle swelling Has patient had a PCN reaction causing immediate rash, facial/tongue/throat swelling, SOB or lightheadedness with hypotension: yes Has patient had a PCN reaction causing severe rash involving mucus membranes or skin necrosis: no Has patient had a PCN  reaction that required hospitalization: no Has patient had a PCN reaction occurring within the last 10 years: no If all of the above answers are "NO", then may proceed with Cephalosporin use.    Medications: I have reviewed the patient's current medications.  Results for orders placed or performed during the hospital encounter of 05/16/17 (from the past 48 hour(s))  I-Stat CG4 Lactic Acid, ED     Status: None   Collection Time: 05/16/17  1:19 PM  Result Value Ref Range   Lactic Acid, Venous 1.47 0.5 - 1.9 mmol/L  CBC with Differential     Status: Abnormal   Collection Time: 05/16/17  2:22 PM  Result Value Ref Range   WBC 11.2 (H) 4.0 - 10.5 K/uL   RBC 4.02 (L) 4.22 - 5.81 MIL/uL   Hemoglobin 12.4 (L) 13.0 - 17.0 g/dL   HCT 37.8 (L) 39.0 - 52.0 %   MCV 94.0 78.0 - 100.0 fL   MCH 30.8 26.0 - 34.0 pg   MCHC 32.8 30.0 - 36.0 g/dL   RDW 15.9 (H) 11.5 - 15.5 %   Platelets 268 150 - 400 K/uL   Neutrophils Relative % 68 %   Neutro Abs 7.7 1.7 - 7.7 K/uL   Lymphocytes Relative 21 %   Lymphs Abs 2.3 0.7 - 4.0 K/uL   Monocytes Relative 8 %   Monocytes Absolute 0.9 0.1 - 1.0 K/uL   Eosinophils Relative 3 %   Eosinophils Absolute 0.3 0.0 - 0.7 K/uL   Basophils Relative 0 %   Basophils Absolute 0.0 0.0 - 0.1 K/uL  Basic metabolic panel     Status: Abnormal   Collection Time: 05/16/17  2:22 PM  Result Value Ref Range   Sodium 135 135 - 145 mmol/L   Potassium 5.0 3.5 - 5.1 mmol/L   Chloride 95 (L) 101 - 111 mmol/L   CO2 25 22 - 32 mmol/L   Glucose, Bld 142 (H) 65 - 99 mg/dL   BUN 67 (H) 6 - 20 mg/dL   Creatinine, Ser 13.82 (H) 0.61 - 1.24 mg/dL   Calcium 9.3 8.9 - 10.3 mg/dL   GFR calc non Af Amer 3 (L) >60 mL/min   GFR calc Af Amer 4 (L) >60 mL/min    Comment: (NOTE) The eGFR has been calculated using the CKD EPI equation. This calculation has not been validated in all clinical situations. eGFR's persistently <60 mL/min signify possible Chronic Kidney Disease.    Anion gap 15 5  - 15  Wound or Superficial Culture     Status: None (Preliminary result)   Collection Time: 05/16/17  2:26 PM  Result Value Ref Range   Specimen Description WOUND ULCER    Special Requests NONE    Gram Stain      MODERATE WBC PRESENT, PREDOMINANTLY PMN FEW GRAM POSITIVE COCCI RARE GRAM NEGATIVE RODS    Culture PENDING    Report Status PENDING   I-Stat CG4 Lactic Acid, ED     Status: None   Collection Time: 05/16/17  3:50 PM  Result Value Ref Range   Lactic Acid, Venous 0.59 0.5 - 1.9 mmol/L  Glucose, capillary     Status: Abnormal   Collection Time: 05/16/17  9:08 PM  Result Value Ref Range   Glucose-Capillary 228 (H) 65 - 99 mg/dL  MRSA PCR Screening     Status: None   Collection Time: 05/16/17 10:11 PM  Result Value Ref Range   MRSA by PCR NEGATIVE NEGATIVE    Comment:        The GeneXpert MRSA Assay (FDA approved for NASAL specimens only), is one component of a comprehensive MRSA colonization surveillance program. It is not intended to diagnose MRSA infection nor to guide or monitor treatment for MRSA infections.     Dg Foot Complete Left  Result Date: 05/16/2017 CLINICAL DATA:  Left foot soft tissue wound with drainage and pain and fevers, initial encounter EXAM: LEFT FOOT - COMPLETE 3+ VIEW COMPARISON:  05/03/2017 FINDINGS: Degenerative changes at first MTP joint are noted. Soft tissue defect is again noted at the distal aspect of the fifth toe as well as laterally in the foot. There are some erosive changes identified at the base of the fifth metatarsal which are new from the prior exam consistent with osteomyelitis. Chronic changes in the heads of the second and fourth metatarsals are seen. Changes of prior fourth toe amputation are again noted. IMPRESSION: New area of lucency at the base of the fifth metatarsal. This is consistent with underlying osteomyelitis. Electronically Signed   By: Inez Catalina M.D.   On: 05/16/2017 14:14    ROS  ROS: I have reviewed the  patient's review of systems thoroughly and there are no positive responses as relates to the HPI. Blood pressure (!) 115/56, pulse 86, temperature 100 F (37.8 C), temperature source Oral, resp. rate 18, height 6' 1" (1.854 m), weight 91.3 kg (201 lb 4.8 oz), SpO2 97 %. Physical Exam Well-developed well-nourished patient in no acute distress. Alert and oriented x3 HEENT:within normal limits Cardiac: Regular rate and rhythm Pulmonary: Lungs clear to auscultation Abdomen: Soft and nontender.  Normal active bowel sounds  Musculoskeletal: (left foot: Open full-thickness wound over the lateral aspect of about 2 x 3 cm.  There's foul-smelling drainage.) Assessment/Plan: 66 year old male currently on renal dialysis with long-term issues with foot wounds and most recently has been fighting an open draining wound over the lateral aspect of the left foot.  He presents at this time with foul-smelling drainage and is admitted by the hospitalists.  MRI shows unfortunately an area of significant osteomyelitis at the base of the fifth metatarsal.//I have had a long discussion with the patient today and unfortunatelyhe will likely need amputation of the base the fifth metatarsal..  He would like to discuss this further with the infectious disease team.  I will go ahead and get him set up for Ray amputation in anticipation of this being the recommendation, but certainly if they want to try long-term IV antibiotic therapy with hyperbarics we would be happy to follow him as well.   My experience with that has not been overwhelmingly favorable and the long-term. It usually has worked well in the short-term.  I have had a long discussion with the patient and his wife today about my thoughts on his treatment. He understands certainly the risk of bleeding, infection, need for further surgery  At a slight chance of death at around the time of surgery.  They want to consider their options at this point.  [name]  L 05/17/2017, 7:23 AM

## 2017-05-17 NOTE — Consult Note (Signed)
[facility]  Total days of antibiotics 2        Day 2 ceftaz        Day 2 vanco              Reason for Consult: left foot osteo    Referring Physician: lama  Principal Problem:   Osteomyelitis of left foot ([place]) Active Problems:   Anemia due to end stage renal disease (HCC)   Cardiomegaly   Benign essential HTN   Congestive dilated cardiomyopathy ([place])   ESRD on dialysis ([place])   Peripheral arterial disease ([place])   ESRD on hemodialysis ([place])   Diabetes mellitus with complication ([place])    HPI: OSA CAMPOLI Sr. is a 66 y.o. male  w/ESRD, DM2, HTN, NICM with systolic CHF. He has [name], [name], [name]  of 102. [name] xrays showed new lucency base of L 5th metatarsal c/w osteo which is consistent with MRI findings c/w osteo per my review. Dr [name] from orthopedics is recommending 5th ray amputation. Patient wanted to discuss option for management of his osteomyelitis. He is afebrile this morning. Tolerated HD yesterday. He states that his amputation to toes on the right foot has healed well since February.  I have reviewed records in [facility]. Past Medical History:  Diagnosis Date  . Cardiomyopathy ([place])   . Diabetes mellitus   . Diabetic foot ulcer ([place])   . Diabetic infection of right foot ([place])   . Diabetic peripheral neuropathy ([place])   . ESRD (end stage renal disease) ([place])   . Headache   . HTN (hypertension)   . Osteomyelitis ([place])   . Peripheral vascular disease ([place])    critical limb ischemia    Allergies:  Allergies  Allergen Reactions  .  Penicillins Hives and Swelling    Ankle swelling Has patient had a PCN reaction causing immediate rash, facial/tongue/throat swelling, SOB or lightheadedness with hypotension: yes Has patient had a PCN reaction causing severe rash involving mucus membranes or skin necrosis: no Has patient had a PCN reaction that required hospitalization: no Has patient had a PCN reaction occurring within the last 10 years: no If all of the above answers are "NO", then may proceed with Cephalosporin use.    MEDICATIONS: . carvedilol  3.125 mg Oral BID  . cinacalcet  120 mg Oral Q supper  . doxercalciferol  6 mcg Intravenous Q M,W,F-HD  . ferric citrate  1,995-3,045 mg Oral See admin instructions  . insulin aspart  0-5 Units Subcutaneous QHS  . insulin aspart  0-9 Units Subcutaneous TID WC  . multivitamin  1 tablet Oral QHS    Social History  Substance Use Topics  . Smoking status: Former Smoker    Packs/day: 0.50    Years: 4.00    Types: Cigarettes  . Smokeless tobacco: Never Used  . Alcohol use No    Family History  Problem Relation Age of Onset  . Heart disease Mother   . Diabetes Father   . Diabetes Brother   . Diabetes Sister   .  Diabetes Brother   . Diabetes Brother      Review of Systems  Constitutional: positive for fever, chills, diaphoresis, activity change, appetite change, fatigue and unexpected weight change.  HENT: Negative for congestion, sore throat, rhinorrhea, sneezing, trouble swallowing and sinus pressure.  Eyes: Negative for photophobia and visual disturbance.  Respiratory: Negative for cough, chest tightness, shortness of breath, wheezing and stridor.  Cardiovascular: Negative for chest pain, palpitations and leg swelling.  Gastrointestinal: Negative for nausea, vomiting, abdominal pain, diarrhea, constipation, blood in stool, abdominal distention and anal bleeding.  Genitourinary: Negative for dysuria, hematuria, flank pain and difficulty urinating.    Musculoskeletal: Negative for myalgias, back pain, joint swelling, arthralgias and gait problem.  Skin: positive for wounds. Neurological: Negative for dizziness, tremors, weakness and light-headedness.  Hematological: Negative for adenopathy. Does not bruise/bleed easily.  Psychiatric/Behavioral: Negative for behavioral problems, confusion, sleep disturbance, dysphoric mood, decreased concentration and agitation.    OBJECTIVE: [name]:  [98.1 F (36.7 C)-101.2 F (38.4 C)] 100 F (37.8 C) (05/22 0526) Pulse Rate:  [75-94] 86 (05/22 0526) Resp:  [14-21] 18 (05/22 0526) BP: (113-158)/(37-82) 115/56 (05/22 0526) SpO2:  [95 %-100 %] 97 % (05/22 0526) Weight:  [201 lb 4.8 oz (91.3 kg)-211 lb 3.2 oz (95.8 kg)] 201 lb 4.8 oz (91.3 kg) (05/21 2104) Physical Exam  Constitutional: He is oriented to person, place, and time. He appears well-developed and well-nourished. No distress.  HENT:  Mouth/Throat: Oropharynx is clear and moist. No oropharyngeal exudate.  Cardiovascular: Normal rate, regular rhythm and normal heart sounds. Exam reveals no gallop and no friction rub.  No murmur heard.  Pulmonary/Chest: Effort normal and breath sounds normal. No respiratory distress. He has no wheezes.  Abdominal: Soft. Bowel sounds are normal. He exhibits no distension. There is no tenderness.  Neurological: He is alert and oriented to person, place, and time.  Skin: Skin is warm and dry. No rash noted. No erythema. Left foot- Large ulcer to lateral foot, as noted in prior photos Ext: two toe amputation from right foot, and one toe amputation from right foot. No pitting edema. Psychiatric: He has a normal mood and affect. His behavior is normal.     LABS: Results for orders placed or performed during the hospital encounter of 05/16/17 (from the past 48 hour(s))  I-Stat CG4 Lactic Acid, ED     Status: None   Collection Time: 05/16/17  1:19 PM  Result Value Ref Range   Lactic Acid, Venous 1.47 0.5 - 1.9  mmol/L  CBC with Differential     Status: Abnormal   Collection Time: 05/16/17  2:22 PM  Result Value Ref Range   WBC 11.2 (H) 4.0 - 10.5 K/uL   RBC 4.02 (L) 4.22 - 5.81 MIL/uL   Hemoglobin 12.4 (L) 13.0 - 17.0 g/dL   HCT 37.8 (L) 39.0 - 52.0 %   MCV 94.0 78.0 - 100.0 fL   MCH 30.8 26.0 - 34.0 pg   MCHC 32.8 30.0 - 36.0 g/dL   RDW 15.9 (H) 11.5 - 15.5 %   Platelets 268 150 - 400 K/uL   Neutrophils Relative % 68 %   Neutro Abs 7.7 1.7 - 7.7 K/uL   Lymphocytes Relative 21 %   Lymphs Abs 2.3 0.7 - 4.0 K/uL   Monocytes Relative 8 %   Monocytes Absolute 0.9 0.1 - 1.0 K/uL   Eosinophils Relative 3 %   Eosinophils Absolute 0.3 0.0 - 0.7 K/uL   Basophils Relative 0 %   Basophils  Absolute 0.0 0.0 - 0.1 K/uL  Basic metabolic panel     Status: Abnormal   Collection Time: 05/16/17  2:22 PM  Result Value Ref Range   Sodium 135 135 - 145 mmol/L   Potassium 5.0 3.5 - 5.1 mmol/L   Chloride 95 (L) 101 - 111 mmol/L   CO2 25 22 - 32 mmol/L   Glucose, Bld 142 (H) 65 - 99 mg/dL   BUN 67 (H) 6 - 20 mg/dL   Creatinine, Ser 13.82 (H) 0.61 - 1.24 mg/dL   Calcium 9.3 8.9 - 10.3 mg/dL   GFR calc non Af Amer 3 (L) >60 mL/min   GFR calc Af Amer 4 (L) >60 mL/min    Comment: (NOTE) The eGFR has been calculated using the CKD EPI equation. This calculation has not been validated in all clinical situations. eGFR's persistently <60 mL/min signify possible Chronic Kidney Disease.    Anion gap 15 5 - 15  Wound or Superficial Culture     Status: None (Preliminary result)   Collection Time: 05/16/17  2:26 PM  Result Value Ref Range   Specimen Description WOUND ULCER    Special Requests NONE    Gram Stain      MODERATE WBC PRESENT, PREDOMINANTLY PMN FEW GRAM POSITIVE COCCI RARE GRAM NEGATIVE RODS    Culture CULTURE REINCUBATED FOR BETTER GROWTH    Report Status PENDING   I-Stat CG4 Lactic Acid, ED     Status: None   Collection Time: 05/16/17  3:50 PM  Result Value Ref Range   Lactic Acid, Venous  0.59 0.5 - 1.9 mmol/L  Glucose, capillary     Status: Abnormal   Collection Time: 05/16/17  9:08 PM  Result Value Ref Range   Glucose-Capillary 228 (H) 65 - 99 mg/dL  MRSA PCR Screening     Status: None   Collection Time: 05/16/17 10:11 PM  Result Value Ref Range   MRSA by PCR NEGATIVE NEGATIVE    Comment:        The GeneXpert MRSA Assay (FDA approved for NASAL specimens only), is one component of a comprehensive MRSA colonization surveillance program. It is not intended to diagnose MRSA infection nor to guide or monitor treatment for MRSA infections.   Glucose, capillary     Status: Abnormal   Collection Time: 05/17/17  7:49 AM  Result Value Ref Range   Glucose-Capillary 153 (H) 65 - 99 mg/dL  Comprehensive metabolic panel     Status: Abnormal   Collection Time: 05/17/17  8:05 AM  Result Value Ref Range   Sodium 136 135 - 145 mmol/L   Potassium 4.4 3.5 - 5.1 mmol/L   Chloride 93 (L) 101 - 111 mmol/L   CO2 29 22 - 32 mmol/L   Glucose, Bld 152 (H) 65 - 99 mg/dL   BUN 33 (H) 6 - 20 mg/dL   Creatinine, Ser 9.83 (H) 0.61 - 1.24 mg/dL   Calcium 9.5 8.9 - 10.3 mg/dL   Total Protein 8.0 6.5 - 8.1 g/dL   Albumin 3.2 (L) 3.5 - 5.0 g/dL   AST 13 (L) 15 - 41 U/L   ALT 9 (L) 17 - 63 U/L   Alkaline Phosphatase 71 38 - 126 U/L   Total Bilirubin 0.7 0.3 - 1.2 mg/dL   GFR calc non Af Amer 5 (L) >60 mL/min   GFR calc Af Amer 6 (L) >60 mL/min    Comment: (NOTE) The eGFR has been calculated using the CKD EPI equation. This  calculation has not been validated in all clinical situations. eGFR's persistently <60 mL/min signify possible Chronic Kidney Disease.    Anion gap 14 5 - 15  CBC     Status: Abnormal   Collection Time: 05/17/17  8:05 AM  Result Value Ref Range   WBC 7.2 4.0 - 10.5 K/uL   RBC 4.24 4.22 - 5.81 MIL/uL   Hemoglobin 12.9 (L) 13.0 - 17.0 g/dL   HCT 40.7 39.0 - 52.0 %   MCV 96.0 78.0 - 100.0 fL   MCH 30.4 26.0 - 34.0 pg   MCHC 31.7 30.0 - 36.0 g/dL   RDW 16.2 (H)  11.5 - 15.5 %   Platelets 270 150 - 400 K/uL  Protime-INR     Status: None   Collection Time: 05/17/17  8:05 AM  Result Value Ref Range   Prothrombin Time 15.2 11.4 - 15.2 seconds   INR 1.19     MICRO: 5/21 wound cx moderate diptheroids and few GNR IMAGING: Mr Foot Left Wo Contrast  Result Date: 05/17/2017 CLINICAL DATA:  Nonhealing ulcer involving the left foot. EXAM: MRI OF THE LEFT FOOT WITHOUT CONTRAST TECHNIQUE: Multiplanar, multisequence MR imaging of the left forefoot was performed. No intravenous contrast was administered. COMPARISON:  None. FINDINGS: Bones/Joint/Cartilage Soft tissue ulcer overlying the base of the fifth metatarsal. Ulcer extends to the cortex of the base of the fifth metatarsal along the lateral margin with cortical irregularity, underlying bone marrow edema and T1 hypointensity most consistent with osteomyelitis. Amputation of the fourth phalanx. No acute fracture or dislocation. Normal alignment. No joint effusion. Mild osteoarthritis of the first MTP joint. Mild osteoarthritis of the second MTP joint. Mild osteoarthritis of the navicular- medial cuneiform joint. Ligaments Collateral ligaments are intact.  Lisfranc ligament is intact. Muscles and Tendons Flexor, peroneal and extensor compartment tendons are intact. Generalized muscle atrophy. Soft tissue No fluid collection or hematoma. No soft tissue mass. Soft tissue edema along the lateral aspect of the foot overlying the fifth metatarsal consistent with cellulitis. IMPRESSION: 1. Soft tissue ulcer overlying the base of the fifth metatarsal. 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 No drainable fluid collection to suggest an abscess. Electronically Signed   By: Kathreen Devoid   On: 05/17/2017 08:15   Dg Foot Complete Left  Result Date: 05/16/2017 CLINICAL DATA:  Left foot soft tissue wound with drainage and pain and [name], initial encounter EXAM: LEFT FOOT - COMPLETE 3+ VIEW  COMPARISON:  05/03/2017 FINDINGS: Degenerative changes at first MTP joint are noted. Soft tissue defect is again noted at the distal aspect of the fifth toe as well as laterally in the foot. There are some erosive changes identified at the base of the fifth metatarsal which are new from the prior exam consistent with osteomyelitis. Chronic changes in the heads of the second and fourth metatarsals are seen. Changes of prior fourth toe amputation are again noted. IMPRESSION: New area of lucency at the base of the fifth metatarsal. This is consistent with underlying osteomyelitis. Electronically Signed   By: Inez Catalina M.D.   On: 05/16/2017 14:14    Assessment/Plan:  66yo M with ESRD on HD, who has chronic ulcer to lateral aspect of left foot in addition to signs of osteo to 5th metatarsal head currently on vancomycin and ceftaz, renally dosed.   Agree with recommendations made by dr graves to do 5th ray amputation to debulk diseased/infected bone and tissue - recommend to get tissue culture  to ensure remaining area does not have residual infection - can continue on vancomycin and ceftaz with HD - will give final recs based upon OR report and follow up cultures  [facility]ane [facility]ullio [facility] [phone]

## 2017-05-17 NOTE — Progress Notes (Signed)
[name] Sr. SJG:[id number] DOB: 1951/04/29 DOA: 05/16/2017 PCP: [name], MD   Brief HPI:    66 y.o. male with medical history significant for diabetes type 2 end-stage renal disease Monday Wednesday Friday dialysis schedule, CAD, anemia, hypertension, systolic heart failure presents to the emergency department from infectious disease clinic with the chief complaint of worsening left foot pain/increased drainage from a nonhealing foot ulcers. Initial evaluation concerning for osteomyelitis. Orthopedic surgery consulted    Subjective   Patient seen and examined, denies any pain. MRI of the foot shows osteomyelitis.   Assessment/Plan:     1. Osteomyelitis of left foot- patient has nonhealing left foot ulcer, MRA of the left foot shows osteomyelitis of left foot fifth metatarsal. Orthopedic surgery recommended  ray amputation, but patient wants to discuss with infectious disease before making the decision. We'll consult ID for further recommendations. Continue Vancomycin and Ceftazidime  with hemodialysis. 2. End-stage renal disease- patient on hemodialysis  Monday Wednesday Friday. Nephrology has been consulted 3. Hypertension-blood pressure is well controlled. Continue Coreg. 4. Diabetes mellitus- continue sliding scale insulin with NovoLog. Hemoglobin A1c is pending. Blood glucose well controlled 5. Anemia of chronic disease- hemoglobin stable at 12.9. 6. Peripheral arterial disease- seen by Dr. Donnetta Hutching in January, patient does not have an evidence of arterial insufficiency syndrome into his foot bilaterally. Note also indicated that he clearly has microvascular disease into his toes due to diabetes and renal failure. Patient should have Eric adequate flow for healing of dictations as planned at that time by orthopedics.    DVT prophylaxis: Heparin  Code Status: Full code  Family Communication: Discussed with patient's wife at bedside    Disposition Plan: Pending ID and orthopedic recommendations   Consultants:  Orthopedics  Procedures:  None   Continuous infusions . cefTAZidime (FORTAZ)  IV Stopped (05/16/17 2337)  . vancomycin        Antibiotics:   Anti-infectives    Start     Dose/Rate Route Frequency Ordered Stop   05/16/17 2200  vancomycin (VANCOCIN) IVPB 1000 mg/200 mL premix     1,000 mg 200 mL/hr over 60 Minutes Intravenous  Once 05/16/17 2002 05/16/17 2214   05/16/17 2100  vancomycin (VANCOCIN) 2,000 mg in sodium chloride 0.9 % 500 mL IVPB  Status:  Discontinued     2,000 mg 250 mL/hr over 120 Minutes Intravenous  Once 05/16/17 1809 05/16/17 2000   05/16/17 2100  vancomycin (VANCOCIN) IVPB 1000 mg/200 mL premix     1,000 mg 200 mL/hr over 60 Minutes Intravenous  Once 05/16/17 2000 05/16/17 2030   05/16/17 2000  cefTAZidime (FORTAZ) 2 g in dextrose 5 % 50 mL IVPB     2 g 100 mL/hr over 30 Minutes Intravenous Every M-W-F (2000) 05/16/17 1610     05/16/17 2000  vancomycin (VANCOCIN) IVPB 1000 mg/200 mL premix  Status:  Discontinued     1,000 mg 200 mL/hr over 60 Minutes Intravenous  Once 05/16/17 1633 05/16/17 1809   05/16/17 1615  vancomycin (VANCOCIN) IVPB 1000 mg/200 mL premix     1,000 mg 200 mL/hr over 60 Minutes Intravenous Every M-W-F (Hemodialysis) 05/16/17 1603     05/16/17 1600  vancomycin (VANCOCIN) 2,000 mg in sodium chloride 0.9 % 500 mL IVPB  Status:  Discontinued     2,000 mg 250 mL/hr over 120 Minutes Intravenous  Once 05/16/17 1603 05/16/17 1609       Objective   Vitals:   05/16/17  2038 05/16/17 2104 05/17/17 0526 05/17/17 1026  BP: (!) 158/77 (!) 153/71 (!) 115/56 (!) 120/58  Pulse: 91 94 86 79  Resp:  20 18 16   Temp:  99.6 F (37.6 C) 100 F (37.8 C) 99.1 F (37.3 C)  TempSrc:  Oral Oral Oral  SpO2:  100% 97% 100%  Weight:  91.3 kg (201 lb 4.8 oz)    Height:  6\' 1"  (1.854 m)      Intake/Output Summary (Last 24 hours) at 05/17/17 1511 Last data filed at  05/17/17 1231  Gross per 24 hour  Intake              290 ml  Output             3300 ml  Net            -3010 ml   Filed Weights   05/16/17 1628 05/16/17 2036 05/16/17 2104  Weight: 95.8 kg (211 lb 3.2 oz) 92.5 kg (203 lb 14.8 oz) 91.3 kg (201 lb 4.8 oz)     Physical Examination:   Physical Exam: Eyes: No icterus, extraocular muscles intact  Mouth: Oral mucosa is moist, no lesions on palate,  Neck: Supple, no deformities, masses, or tenderness Lungs: Normal respiratory effort, bilateral clear to auscultation, no crackles or wheezes.  Heart: Regular rate and rhythm, S1 and S2 normal, no murmurs, rubs auscultated Abdomen: BS normoactive,soft,nondistended,non-tender to palpation,no organomegaly Extremities: Left foot in an dressing Neuro : Alert and oriented to time, place and person, No focal deficits  Skin: No rashes seen on exam     Data Reviewed: I have personally reviewed following labs and imaging studies  CBG:  Recent Labs Lab 05/16/17 2108 05/17/17 0749 05/17/17 1223  GLUCAP 228* 153* 187*    CBC:  Recent Labs Lab 05/16/17 1422 05/17/17 0805  WBC 11.2* 7.2  NEUTROABS 7.7  --   HGB 12.4* 12.9*  HCT 37.8* 40.7  MCV 94.0 96.0  PLT 268 948    Basic Metabolic Panel:  Recent Labs Lab 05/16/17 1422 05/17/17 0805  NA 135 136  K 5.0 4.4  CL 95* 93*  CO2 25 29  GLUCOSE 142* 152*  BUN 67* 33*  CREATININE 13.82* 9.83*  CALCIUM 9.3 9.5    Recent Results (from the past 240 hour(s))  Aerobic/Anaerobic Culture (surgical/deep wound)     Status: Abnormal   Collection Time: 05/10/17 11:15 AM  Result Value Ref Range Status   Specimen Description FOOT DEEP TISSUE CULTURE, LEFT LATERAL FOOT  Final   Special Requests NONE  Final   Gram Stain   Final    FEW WBC PRESENT, PREDOMINANTLY PMN FEW GRAM POSITIVE COCCI FEW GRAM NEGATIVE RODS    Culture (A)  Final    MULTIPLE ORGANISMS PRESENT, NONE PREDOMINANT MODERATE BACTEROIDES SPECIES BETA LACTAMASE  POSITIVE Performed at [facility], [place] [address], [place],  [address]    Report Status 05/15/2017 FINAL  Final  Wound or Superficial Culture     Status: None (Preliminary result)   Collection Time: 05/16/17  2:26 PM  Result Value Ref Range Status   Specimen Description WOUND ULCER  Final   Special Requests NONE  Final   Gram Stain   Final    MODERATE WBC PRESENT, PREDOMINANTLY PMN FEW GRAM POSITIVE COCCI RARE GRAM NEGATIVE RODS    Culture   Final    MODERATE DIPHTHEROIDS(CORYNEBACTERIUM SPECIES) Standardized susceptibility testing for this organism is not available.    Report Status  PENDING  Incomplete  MRSA PCR Screening     Status: None   Collection Time: 05/16/17 10:11 PM  Result Value Ref Range Status   MRSA by PCR NEGATIVE NEGATIVE Final    Comment:        The GeneXpert MRSA Assay (FDA approved for NASAL specimens only), is one component of a comprehensive MRSA colonization surveillance program. It is not intended to diagnose MRSA infection nor to guide or monitor treatment for MRSA infections.      Liver Function Tests:  Recent Labs Lab 05/17/17 0805  AST 13*  ALT 9*  ALKPHOS 71  BILITOT 0.7  PROT 8.0  ALBUMIN 3.2*      Studies: Mr Foot Left Wo Contrast  Result Date: 05/17/2017 CLINICAL DATA:  Nonhealing ulcer involving the left foot. EXAM: MRI OF THE LEFT FOOT WITHOUT CONTRAST TECHNIQUE: Multiplanar, multisequence MR imaging of the left forefoot was performed. No intravenous contrast was administered. COMPARISON:  None. FINDINGS: Bones/Joint/Cartilage Soft tissue ulcer overlying the base of the fifth metatarsal. Ulcer extends to the cortex of the base of the fifth metatarsal along the lateral margin with cortical irregularity, underlying bone marrow edema and T1 hypointensity most consistent with osteomyelitis. Amputation of the fourth phalanx. No acute fracture or dislocation. Normal alignment. No joint effusion. Mild osteoarthritis of the  first MTP joint. Mild osteoarthritis of the second MTP joint. Mild osteoarthritis of the navicular- medial cuneiform joint. Ligaments Collateral ligaments are intact.  Lisfranc ligament is intact. Muscles and Tendons Flexor, peroneal and extensor compartment tendons are intact. Generalized muscle atrophy. Soft tissue No fluid collection or hematoma. No soft tissue mass. Soft tissue edema along the lateral aspect of the foot overlying the fifth metatarsal consistent with cellulitis. IMPRESSION: 1. Soft tissue ulcer overlying the base of the fifth metatarsal. 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 No drainable fluid collection to suggest an abscess. Electronically Signed   By: Kathreen Devoid   On: 05/17/2017 08:15   Dg Foot Complete Left  Result Date: 05/16/2017 CLINICAL DATA:  Left foot soft tissue wound with drainage and pain and fevers, initial encounter EXAM: LEFT FOOT - COMPLETE 3+ VIEW COMPARISON:  05/03/2017 FINDINGS: Degenerative changes at first MTP joint are noted. Soft tissue defect is again noted at the distal aspect of the fifth toe as well as laterally in the foot. There are some erosive changes identified at the base of the fifth metatarsal which are new from the prior exam consistent with osteomyelitis. Chronic changes in the heads of the second and fourth metatarsals are seen. Changes of prior fourth toe amputation are again noted. IMPRESSION: New area of lucency at the base of the fifth metatarsal. This is consistent with underlying osteomyelitis. Electronically Signed   By: Inez Catalina M.D.   On: 05/16/2017 14:14    Scheduled Meds: . carvedilol  3.125 mg Oral BID  . cinacalcet  120 mg Oral Q supper  . doxercalciferol  6 mcg Intravenous Q M,W,F-HD  . ferric citrate  1,995-3,045 mg Oral See admin instructions  . insulin aspart  0-5 Units Subcutaneous QHS  . insulin aspart  0-9 Units Subcutaneous TID WC  . multivitamin  1 tablet Oral QHS      Time  spent: 25 min  [facility] [place] Pager [phone]. If 7PM-7AM, please contact night-coverage at [email or web address], Office  [phone]  password [email or web address] 05/17/2017, 3:11 PM  LOS: 1 day

## 2017-05-18 ENCOUNTER — Inpatient Hospital Stay (HOSPITAL_COMMUNITY): Payer: Medicare Other

## 2017-05-18 ENCOUNTER — Inpatient Hospital Stay (HOSPITAL_COMMUNITY): Payer: Medicare Other | Admitting: Certified Registered Nurse Anesthetist

## 2017-05-18 ENCOUNTER — Encounter (HOSPITAL_COMMUNITY): Payer: Self-pay | Admitting: Certified Registered Nurse Anesthetist

## 2017-05-18 ENCOUNTER — Encounter (HOSPITAL_COMMUNITY)
Admission: EM | Disposition: A | Discharge status: Home Health | Payer: Self-pay | Source: Home / Self Care | Attending: Internal Medicine

## 2017-05-18 DIAGNOSIS — D631 Anemia in chronic kidney disease: Secondary | ICD-10-CM

## 2017-05-18 DIAGNOSIS — I517 Cardiomegaly: Secondary | ICD-10-CM

## 2017-05-18 HISTORY — PX: AMPUTATION TOE: SHX6595

## 2017-05-18 LAB — CBC
HEMATOCRIT: 38 % — AB (ref 39.0–52.0)
HEMOGLOBIN: 12.5 g/dL — AB (ref 13.0–17.0)
MCH: 30.6 pg (ref 26.0–34.0)
MCHC: 32.9 g/dL (ref 30.0–36.0)
MCV: 93.1 fL (ref 78.0–100.0)
Platelets: 291 10*3/uL (ref 150–400)
RBC: 4.08 MIL/uL — AB (ref 4.22–5.81)
RDW: 15.8 % — ABNORMAL HIGH (ref 11.5–15.5)
WBC: 9 10*3/uL (ref 4.0–10.5)

## 2017-05-18 LAB — GLUCOSE, CAPILLARY
Glucose-Capillary: 121 mg/dL — ABNORMAL HIGH (ref 65–99)
Glucose-Capillary: 124 mg/dL — ABNORMAL HIGH (ref 65–99)
Glucose-Capillary: 114 mg/dL — ABNORMAL HIGH (ref 65–99)
Glucose-Capillary: 141 mg/dL — ABNORMAL HIGH (ref 65–99)

## 2017-05-18 LAB — HEMOGLOBIN A1C
HEMOGLOBIN A1C: 6.7 % — AB (ref 4.8–5.6)
MEAN PLASMA GLUCOSE: 146 mg/dL

## 2017-05-18 LAB — RENAL FUNCTION PANEL
ANION GAP: 17 — AB (ref 5–15)
Albumin: 2.9 g/dL — ABNORMAL LOW (ref 3.5–5.0)
BUN: 48 mg/dL — ABNORMAL HIGH (ref 6–20)
CHLORIDE: 90 mmol/L — AB (ref 101–111)
CO2: 24 mmol/L (ref 22–32)
Calcium: 8.9 mg/dL (ref 8.9–10.3)
Creatinine, Ser: 11.94 mg/dL — ABNORMAL HIGH (ref 0.61–1.24)
GFR calc Af Amer: 4 mL/min — ABNORMAL LOW (ref >60)
GFR calc non Af Amer: 4 mL/min — ABNORMAL LOW (ref >60)
GLUCOSE: 156 mg/dL — AB (ref 65–99)
POTASSIUM: 4.5 mmol/L (ref 3.5–5.1)
Phosphorus: 6.9 mg/dL — ABNORMAL HIGH (ref 2.5–4.6)
SODIUM: 131 mmol/L — AB (ref 135–145)

## 2017-05-18 LAB — SURGICAL PCR SCREEN
MRSA, PCR: NEGATIVE
STAPHYLOCOCCUS AUREUS: NEGATIVE

## 2017-05-18 SURGERY — AMPUTATION, TOE
Anesthesia: Regional | Laterality: Left

## 2017-05-18 MED ORDER — METOPROLOL TARTRATE 5 MG/5ML IV SOLN
INTRAVENOUS | Status: AC
  Administered 2017-05-18: 0.3 mg
  Filled 2017-05-18: qty 5

## 2017-05-18 MED ORDER — LIDOCAINE 2% (20 MG/ML) 5 ML SYRINGE
INTRAMUSCULAR | Status: AC
  Filled 2017-05-18: qty 5

## 2017-05-18 MED ORDER — HYDROCODONE-ACETAMINOPHEN 5-325 MG PO TABS
1.0000 | ORAL_TABLET | Freq: Four times a day (QID) | ORAL | Status: DC | PRN
  Administered 2017-05-18 – 2017-05-19 (×2): 2 via ORAL
  Administered 2017-05-19: 1 via ORAL
  Administered 2017-05-20 – 2017-05-21 (×4): 2 via ORAL
  Filled 2017-05-18: qty 2
  Filled 2017-05-18: qty 1
  Filled 2017-05-18 (×5): qty 2

## 2017-05-18 MED ORDER — MIDAZOLAM HCL 2 MG/2ML IJ SOLN
INTRAMUSCULAR | Status: AC
Start: 2017-05-18 — End: 2017-05-18
  Filled 2017-05-18: qty 2

## 2017-05-18 MED ORDER — LIDOCAINE 2% (20 MG/ML) 5 ML SYRINGE
INTRAMUSCULAR | Status: DC | PRN
  Administered 2017-05-18: 20 mg via INTRAVENOUS

## 2017-05-18 MED ORDER — LIDOCAINE-PRILOCAINE 2.5-2.5 % EX CREA: 1.0000 "application " | TOPICAL_CREAM | CUTANEOUS | Status: DC | PRN

## 2017-05-18 MED ORDER — LIDOCAINE HCL (PF) 1 % IJ SOLN: 5.0000 mL | INTRAMUSCULAR | Status: DC | PRN

## 2017-05-18 MED ORDER — ROPIVACAINE HCL 5 MG/ML IJ SOLN
INTRAMUSCULAR | Status: DC | PRN
  Administered 2017-05-18: 40 mL via PERINEURAL

## 2017-05-18 MED ORDER — SODIUM CHLORIDE 0.9 % IV SOLN
100.0000 mL | INTRAVENOUS | Status: DC | PRN
Start: 2017-05-18 — End: 2017-05-18

## 2017-05-18 MED ORDER — HEPARIN SODIUM (PORCINE) 1000 UNIT/ML DIALYSIS: 1000.0000 [IU] | INTRAMUSCULAR | Status: DC | PRN

## 2017-05-18 MED ORDER — DOXERCALCIFEROL 4 MCG/2ML IV SOLN
INTRAVENOUS | Status: AC
  Filled 2017-05-18: qty 4

## 2017-05-18 MED ORDER — FENTANYL CITRATE (PF) 100 MCG/2ML IJ SOLN
INTRAMUSCULAR | Status: DC | PRN
  Administered 2017-05-18: 25 ug via INTRAVENOUS

## 2017-05-18 MED ORDER — ONDANSETRON HCL 4 MG/2ML IJ SOLN
INTRAMUSCULAR | Status: AC
  Filled 2017-05-18: qty 2

## 2017-05-18 MED ORDER — PROPOFOL 10 MG/ML IV BOLUS
INTRAVENOUS | Status: DC | PRN
  Administered 2017-05-18: 20 mg via INTRAVENOUS

## 2017-05-18 MED ORDER — PHENYLEPHRINE 40 MCG/ML (10ML) SYRINGE FOR IV PUSH (FOR BLOOD PRESSURE SUPPORT)
PREFILLED_SYRINGE | INTRAVENOUS | Status: DC | PRN
  Administered 2017-05-18 (×2): 120 ug via INTRAVENOUS
  Administered 2017-05-18 (×2): 80 ug via INTRAVENOUS

## 2017-05-18 MED ORDER — PENTAFLUOROPROP-TETRAFLUOROETH EX AERO: 1.0000 "application " | INHALATION_SPRAY | CUTANEOUS | Status: DC | PRN

## 2017-05-18 MED ORDER — PROPOFOL 500 MG/50ML IV EMUL
INTRAVENOUS | Status: DC | PRN
  Administered 2017-05-18: 50 ug/kg/min via INTRAVENOUS

## 2017-05-18 MED ORDER — PROPOFOL 10 MG/ML IV BOLUS
INTRAVENOUS | Status: AC
  Filled 2017-05-18: qty 20

## 2017-05-18 MED ORDER — SODIUM CHLORIDE 0.9 % IR SOLN
Status: DC | PRN
  Administered 2017-05-18: 3000 mL

## 2017-05-18 MED ORDER — 0.9 % SODIUM CHLORIDE (POUR BTL) OPTIME
TOPICAL | Status: DC | PRN
  Administered 2017-05-18: 1000 mL

## 2017-05-18 MED ORDER — VANCOMYCIN HCL IN DEXTROSE 1-5 GM/200ML-% IV SOLN
INTRAVENOUS | Status: AC
  Filled 2017-05-18: qty 200

## 2017-05-18 MED ORDER — FENTANYL CITRATE (PF) 100 MCG/2ML IJ SOLN
INTRAMUSCULAR | Status: AC
  Administered 2017-05-18: 75 ug
  Filled 2017-05-18: qty 2

## 2017-05-18 MED ORDER — FENTANYL CITRATE (PF) 100 MCG/2ML IJ SOLN: 25.0000 ug | INTRAMUSCULAR | Status: DC | PRN

## 2017-05-18 MED ORDER — SODIUM CHLORIDE 0.9 % IV SOLN: 100.0000 mL | INTRAVENOUS | Status: DC | PRN

## 2017-05-18 MED ORDER — ONDANSETRON HCL 4 MG/2ML IJ SOLN: 4.0000 mg | Freq: Once | INTRAMUSCULAR | Status: DC | PRN

## 2017-05-18 MED ORDER — FENTANYL CITRATE (PF) 250 MCG/5ML IJ SOLN
INTRAMUSCULAR | Status: AC
  Filled 2017-05-18: qty 5

## 2017-05-18 MED ORDER — SODIUM CHLORIDE 0.9 % IV SOLN: INTRAVENOUS | Status: DC

## 2017-05-18 MED ORDER — HEPARIN SODIUM (PORCINE) 1000 UNIT/ML DIALYSIS
6000.0000 [IU] | INTRAMUSCULAR | Status: DC | PRN
  Filled 2017-05-18: qty 6

## 2017-05-18 MED ORDER — PHENYLEPHRINE 40 MCG/ML (10ML) SYRINGE FOR IV PUSH (FOR BLOOD PRESSURE SUPPORT)
PREFILLED_SYRINGE | INTRAVENOUS | Status: AC
  Filled 2017-05-18: qty 10

## 2017-05-18 MED ORDER — SODIUM CHLORIDE 0.9 % IV SOLN
INTRAVENOUS | Status: DC | PRN
  Administered 2017-05-18: 13:00:00 via INTRAVENOUS

## 2017-05-18 SURGICAL SUPPLY — 56 items
BANDAGE ACE 3X5.8 VEL STRL LF (GAUZE/BANDAGES/DRESSINGS) ×3 IMPLANT
BANDAGE ACE 4X5 VEL STRL LF (GAUZE/BANDAGES/DRESSINGS) ×3 IMPLANT
BLADE OSCILLATING /SAGITTAL (BLADE) ×3 IMPLANT
BNDG COHESIVE 1X5 TAN STRL LF (GAUZE/BANDAGES/DRESSINGS) IMPLANT
BNDG CONFORM 2 STRL LF (GAUZE/BANDAGES/DRESSINGS) IMPLANT
BNDG ESMARK 4X9 LF (GAUZE/BANDAGES/DRESSINGS) ×3 IMPLANT
BNDG GAUZE ELAST 4 BULKY (GAUZE/BANDAGES/DRESSINGS) ×3 IMPLANT
BRUSH SCRUB EZ  4% CHG (MISCELLANEOUS) ×2
BRUSH SCRUB EZ 4% CHG (MISCELLANEOUS) ×1 IMPLANT
COVER SURGICAL LIGHT HANDLE (MISCELLANEOUS) ×3 IMPLANT
CUFF TOURNIQUET SINGLE 18IN (TOURNIQUET CUFF) ×3 IMPLANT
CUFF TOURNIQUET SINGLE 24IN (TOURNIQUET CUFF) IMPLANT
CUFF TOURNIQUET SINGLE 34IN LL (TOURNIQUET CUFF) IMPLANT
CUFF TOURNIQUET SINGLE 44IN (TOURNIQUET CUFF) IMPLANT
DRAIN PENROSE 1/4X12 LTX STRL (WOUND CARE) ×3 IMPLANT
DRAPE U-SHAPE 47X51 STRL (DRAPES) ×3 IMPLANT
DRSG ADAPTIC 3X8 NADH LF (GAUZE/BANDAGES/DRESSINGS) ×3 IMPLANT
DRSG PAD ABDOMINAL 8X10 ST (GAUZE/BANDAGES/DRESSINGS) ×9 IMPLANT
DURAPREP 26ML APPLICATOR (WOUND CARE) ×3 IMPLANT
GAUZE SPONGE 2X2 8PLY STRL LF (GAUZE/BANDAGES/DRESSINGS) IMPLANT
GAUZE SPONGE 4X4 12PLY STRL (GAUZE/BANDAGES/DRESSINGS) ×3 IMPLANT
GLOVE BIO SURGEON STRL SZ 6.5 (GLOVE) ×2 IMPLANT
GLOVE BIO SURGEONS STRL SZ 6.5 (GLOVE) ×1
GLOVE BIOGEL PI IND STRL 6 (GLOVE) ×1 IMPLANT
GLOVE BIOGEL PI IND STRL 8 (GLOVE) ×2 IMPLANT
GLOVE BIOGEL PI INDICATOR 6 (GLOVE) ×2
GLOVE BIOGEL PI INDICATOR 8 (GLOVE) ×4
GLOVE ECLIPSE 7.5 STRL STRAW (GLOVE) ×6 IMPLANT
GOWN STRL REUS W/ TWL LRG LVL3 (GOWN DISPOSABLE) ×2 IMPLANT
GOWN STRL REUS W/ TWL XL LVL3 (GOWN DISPOSABLE) ×2 IMPLANT
GOWN STRL REUS W/TWL LRG LVL3 (GOWN DISPOSABLE) ×4
GOWN STRL REUS W/TWL XL LVL3 (GOWN DISPOSABLE) ×4
KIT BASIN OR (CUSTOM PROCEDURE TRAY) ×3 IMPLANT
KIT ROOM TURNOVER OR (KITS) ×3 IMPLANT
MANIFOLD NEPTUNE II (INSTRUMENTS) ×3 IMPLANT
NS IRRIG 1000ML POUR BTL (IV SOLUTION) ×3 IMPLANT
PACK ORTHO EXTREMITY (CUSTOM PROCEDURE TRAY) ×3 IMPLANT
PAD ARMBOARD 7.5X6 YLW CONV (MISCELLANEOUS) ×6 IMPLANT
PAD CAST 4YDX4 CTTN HI CHSV (CAST SUPPLIES) ×1 IMPLANT
PADDING CAST ABS 4INX4YD NS (CAST SUPPLIES) ×2
PADDING CAST ABS COTTON 4X4 ST (CAST SUPPLIES) ×1 IMPLANT
PADDING CAST COTTON 4X4 STRL (CAST SUPPLIES) ×2
SOL PREP POV-IOD 4OZ 10% (MISCELLANEOUS) IMPLANT
SPECIMEN JAR SMALL (MISCELLANEOUS) IMPLANT
SPONGE GAUZE 2X2 STER 10/PKG (GAUZE/BANDAGES/DRESSINGS)
STAPLER SKIN PROX WIDE 3.9 (STAPLE) ×3 IMPLANT
SUCTION FRAZIER HANDLE 10FR (MISCELLANEOUS)
SUCTION TUBE FRAZIER 10FR DISP (MISCELLANEOUS) IMPLANT
SUT ETHILON 2 0 PSLX (SUTURE) ×6 IMPLANT
SUT ETHILON 4 0 PS 2 18 (SUTURE) ×3 IMPLANT
TOWEL OR 17X24 6PK STRL BLUE (TOWEL DISPOSABLE) ×3 IMPLANT
TOWEL OR 17X26 10 PK STRL BLUE (TOWEL DISPOSABLE) ×3 IMPLANT
TUBE CONNECTING 12'X1/4 (SUCTIONS)
TUBE CONNECTING 12X1/4 (SUCTIONS) IMPLANT
UNDERPAD 30X30 (UNDERPADS AND DIAPERS) ×3 IMPLANT
WATER STERILE IRR 1000ML POUR (IV SOLUTION) ×3 IMPLANT

## 2017-05-18 NOTE — Anesthesia Preprocedure Evaluation (Addendum)
Anesthesia Evaluation  Patient identified by MRN, date of birth, ID band Patient awake    Reviewed: Allergy & Precautions, H&P , NPO status , Patient's Chart, lab work & pertinent test results, reviewed documented beta blocker date and time   History of Anesthesia Complications Negative for: history of anesthetic complications  Airway Mallampati: II  TM Distance: >3 FB Neck ROM: Full    Dental no notable dental hx. (+) Dental Advisory Given, Missing, Poor Dentition,    Pulmonary former smoker,    Pulmonary exam normal breath sounds clear to auscultation       Cardiovascular hypertension, Pt. on medications and Pt. on home beta blockers + Peripheral Vascular Disease and +CHF  Normal cardiovascular exam Rhythm:Regular Rate:Normal  ECG: SR, LBBB, rate 76 ECHO (2016) - Normal LV function; mild LVH; grade 1 diastolic dysfunction; mild   LAE, mild RAE/RVE. Compared to 11/23/14, LV function and TR have   improved and pulmonary pressures lower.    Neuro/Psych negative neurological ROS  negative psych ROS   GI/Hepatic negative GI ROS, Neg liver ROS,   Endo/Other  diabetes, Poorly Controlled, Type 2, Insulin Dependent  Renal/GU ESRFRenal diseaseOn HD, M,W,F  negative genitourinary   Musculoskeletal negative musculoskeletal ROS (+)   Abdominal (+)  Abdomen: soft.    Peds negative pediatric ROS (+)  Hematology  (+) anemia , Improved at 7/23.  Has received PRBC   Anesthesia Other Findings   Reproductive/Obstetrics negative OB ROS                            Anesthesia Physical Anesthesia Plan  ASA: IV  Anesthesia Plan: Regional and MAC   Post-op Pain Management:    Induction: Intravenous  Airway Management Planned: Simple Face Mask  Additional Equipment:   Intra-op Plan:   Post-operative Plan: Extubation in OR  Informed Consent: I have reviewed the patients History and Physical,  chart, labs and discussed the procedure including the risks, benefits and alternatives for the proposed anesthesia with the patient or authorized representative who has indicated his/her understanding and acceptance.   Dental advisory given  Plan Discussed with: CRNA and Surgeon  Anesthesia Plan Comments:        Anesthesia Quick Evaluation

## 2017-05-18 NOTE — Progress Notes (Signed)
Subjective: Persistent drainage l foot lateral side.   Objective: Vital signs in last 24 hours: Temp:  [99.1 F (37.3 C)-100.9 F (38.3 C)] 99.3 F (37.4 C) (05/23 0648) Pulse Rate:  [74-80] 78 (05/23 0415) Resp:  [16-20] 17 (05/23 0415) BP: (91-126)/(46-58) 118/57 (05/23 0648) SpO2:  [94 %-100 %] 94 % (05/23 0415) Weight:  [89.5 kg (197 lb 5 oz)-91.6 kg (201 lb 15.1 oz)] 89.5 kg (197 lb 5 oz) (05/23 4259)  Intake/Output from previous day: 05/22 0701 - 05/23 0700 In: 420 [P.O.:420] Out: 0  Intake/Output this shift: No intake/output data recorded.   Recent Labs  05/16/17 1422 05/17/17 0805  HGB 12.4* 12.9*    Recent Labs  05/16/17 1422 05/17/17 0805  WBC 11.2* 7.2  RBC 4.02* 4.24  HCT 37.8* 40.7  PLT 268 270    Recent Labs  05/16/17 1422 05/17/17 0805  NA 135 136  K 5.0 4.4  CL 95* 93*  CO2 25 29  BUN 67* 33*  CREATININE 13.82* 9.83*  GLUCOSE 142* 152*  CALCIUM 9.3 9.5    Recent Labs  05/17/17 0805  INR 1.19    Neurologically intact Neurovascular intact Sensation intact distally Compartment soft  Assessment/Plan: l foot draining ulcer with osteomyelitis//Plan amputation l 5th ray later today after dialysis.   Sharmain Lastra L 05/18/2017, 7:29 AM

## 2017-05-18 NOTE — Progress Notes (Signed)
Orthopedic Tech Progress Note Patient Details:  [name] PUDLO Sr. 1950-12-29 [id number]  Ortho Devices Type of Ortho Device: Darco shoe Ortho Device/Splint Location: LLE Ortho Device/Splint Interventions: Ordered, Application   Braulio Bosch 05/18/2017, 6:07 PM

## 2017-05-18 NOTE — Brief Op Note (Signed)
05/16/2017 - 05/18/2017  3:17 PM  PATIENT:  [name] Sages Sr.  66 y.o. 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  PROCEDURE:  Procedure(s): 5TH RAY AMPUTATION  and EXCISION CUBOID (Left)  SURGEON:  Surgeon(s) and Role:    Dorna Leitz, MD - Primary  PHYSICIAN ASSISTANT:   ASSISTANTS: bethune   ANESTHESIA:   regional  EBL:  Total I/O In: -  Out: 1020 [Other:1000; Blood:20]  BLOOD ADMINISTERED:none  DRAINS: none   LOCAL MEDICATIONS USED:  NONE  SPECIMEN:  No Specimen  DISPOSITION OF SPECIMEN:  N/A  COUNTS:  YES  TOURNIQUET:   Total Tourniquet Time Documented: Calf (Left) - 8 minutes Total: Calf (Left) - 8 minutes   DICTATION: .Other Dictation: Dictation Number [id number]  PLAN OF CARE: Admit to inpatient   PATIENT DISPOSITION:  PACU - hemodynamically stable.   Delay start of Pharmacological VTE agent (>24hrs) due to surgical blood loss or risk of bleeding: no

## 2017-05-18 NOTE — Anesthesia Postprocedure Evaluation (Signed)
Anesthesia Post Note  Patient: [name] SCHLEIFER Sr.  Procedure(s) Performed: Procedure(s) (LRB): 5TH RAY AMPUTATION  and EXCISION CUBOID (Left)  Patient location during evaluation: PACU Anesthesia Type: Regional and MAC Level of consciousness: awake and alert Pain management: pain level controlled Vital Signs Assessment: post-procedure vital signs reviewed and stable Respiratory status: spontaneous breathing, nonlabored ventilation, respiratory function stable and patient connected to nasal cannula oxygen Cardiovascular status: stable and blood pressure returned to baseline Anesthetic complications: no       Last Vitals:  Vitals:   05/18/17 1611 05/18/17 1635  BP: (!) 89/51 (!) 94/50  Pulse: 80 80  Resp: 19 18  Temp: 37.7 C 37.8 C    Last Pain:  Vitals:   05/18/17 1635  TempSrc: Oral  PainSc:                  Tristyn Pharris P Norely Schlick

## 2017-05-18 NOTE — Progress Notes (Signed)
Pt back in the room from PACU, A&O, VSS, denies any pain, dressing to L foot dry and intact. Ortho tech paged for pt's post op shoe as ordered. Will continue to monitor.

## 2017-05-18 NOTE — Transfer of Care (Signed)
Immediate Anesthesia Transfer of Care Note  Patient: OMER PUCCINELLI Sr.  Procedure(s) Performed: Procedure(s): 5TH RAY AMPUTATION  and EXCISION CUBOID (Left)  Patient Location: PACU  Anesthesia Type:MAC combined with regional for post-op pain  Level of Consciousness: awake, alert , oriented and patient cooperative  Airway & Oxygen Therapy: Patient Spontanous Breathing and Patient connected to face mask oxygen  Post-op Assessment: Report given to RN and Post -op Vital signs reviewed and stable  Post vital signs: Reviewed and stable  Last Vitals:  Vitals:   05/18/17 1325 05/18/17 1340  BP: (!) 111/59 (!) 122/53  Pulse: 91 86  Resp: (!) 26 (!) 21  Temp:      Last Pain:  Vitals:   05/18/17 1134  TempSrc: Oral  PainSc:          Complications: No apparent anesthesia complications

## 2017-05-18 NOTE — Progress Notes (Signed)
[name] Progress Note   HD procedure note:  I have personally attended this patient's dialysis session.   Under EDW (89.5 w/EDW 92.5) with soft BP Keep volume even with HD L AVF 400 Surgery moved to 12Noon so will not need to shorten Rx  [name] Maes, MD [facility] [phone] Pager 05/18/2017, 7:29 AM  ROUNDING NOTE Surgery (ray amputation) has been pushed back some  On HD now Feels "bad" Intermittent chills and fever past 24 hours w/sweats  Objective Vitals:   05/17/17 2131 05/17/17 2208 05/18/17 0415 05/18/17 0648  BP: (!) 126/53  (!) 109/53 (!) 118/57  Pulse: 80  78   Resp: 17  17   Temp: (!) 100.9 F (38.3 C)  99.5 F (37.5 C) 99.3 F (37.4 C)  TempSrc:    Oral  SpO2: 99%  94%   Weight:  91.6 kg (201 lb 15.1 oz)  89.5 kg (197 lb 5 oz)  Height:       Physical Exam WNWD male NAD  Seen on dialysis Regular S1S2 No S3 Lungs clear Abd soft and non tender No LE edema L foot wrapped LUE AVF accessed for HD   Recent Labs Lab 05/16/17 1422 05/17/17 0805  NA 135 136  K 5.0 4.4  CL 95* 93*  CO2 25 29  GLUCOSE 142* 152*  BUN 67* 33*  CREATININE 13.82* 9.83*  CALCIUM 9.3 9.5    Recent Labs Lab 05/16/17 1422 05/17/17 0805  WBC 11.2* 7.2  NEUTROABS 7.7  --   HGB 12.4* 12.9*  HCT 37.8* 40.7  MCV 94.0 96.0  PLT 268 270       Component Value Date/Time   SDES WOUND ULCER 05/16/2017 1426   SPECREQUEST NONE 05/16/2017 1426   CULT  05/16/2017 1426    MODERATE DIPHTHEROIDS(CORYNEBACTERIUM SPECIES) Standardized susceptibility testing for this organism is not available.    REPTSTATUS PENDING 05/16/2017 1426     Recent Labs Lab 05/16/17 2108 05/17/17 0749 05/17/17 1223 05/17/17 1607 05/17/17 2202  GLUCAP 228* 153* 187* 163* 153*   Medications: . sodium chloride    . sodium chloride    . cefTAZidime (FORTAZ)  IV Stopped (05/16/17 2337)  . vancomycin    . vancomycin     . carvedilol  3.125 mg Oral BID  .  chlorhexidine  60 mL Topical Once  . cinacalcet  120 mg Oral Q supper  . doxercalciferol  6 mcg Intravenous Q M,W,F-HD  . ferric citrate  210 mg Oral TID WC  . insulin aspart  0-5 Units Subcutaneous QHS  . insulin aspart  0-9 Units Subcutaneous TID WC  . multivitamin  1 tablet Oral QHS  . povidone-iodine  2 application Topical Once   Dialysis Orders:  [facility] 4.25 hours 425/800 EDW 92.5 kg 2K2.25 Ca L AVF Hectorol 4 mcg Heparin 6000 units (sensipar 60 2/day at home; bider auryxia 2 tid w/meals)  Background:  66 y.o. year-old AAM w/ESRD, DM2, HTN, NICM with sCHF. Has had issues with foot ulcers with osteo of R foot earlier this year treated with V/F/hyperbaric O2. Current admission is for ulceration lateral aspect LEFT foot with associated chills, temp today to 102, foot pain/drainage/foul odor. Plain film with new lucency base of L 5th metatarsal c/w osteo. Admitted for IV ATB's, surgical consultation by Dr. [name]  Assessment/Plan: 1. Osteomyelitis L fifth metatarsal -  Vanco/fortaz per Dr. [name], surgical consultation by Dr. [name]; MRI 5/22 - osteo no abscess. Plan is for surgery today  5/23/bone for tissue culture/ID following 2. ESRD - MWF  2K bath No heparin. Under EDW. BP soft. Keep even 3. Secondary HPT - hectorol with HD. Sensipar 120/day. [name]. 2ac 4. DM - insulin per primary team 5. 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 HTN/Volume  - usual meds/ HD Mon net UF 3.3L post wt 91.3kg - got below edw - follow weights no change for now 7. PAD - has had prior evaluation for large vessel ds (Dr. Donnetta Hutching) and felt adequate flow to heal amputations   [name] Maes, MD [place] Pager 05/18/2017, 7:36 AM

## 2017-05-18 NOTE — Anesthesia Procedure Notes (Addendum)
Anesthesia Regional Block: Popliteal block (+ U/S guided saphenous block)   Pre-Anesthetic Checklist: ,, timeout performed, Correct Patient, Correct Site, Correct Laterality, Correct Procedure, Correct Position, site marked, Risks and benefits discussed,  Surgical consent,  Pre-op evaluation,  At surgeon's request and post-op pain management  Laterality: Left  Prep: chloraprep       Needles:  Injection technique: Single-shot  Needle Type: Echogenic Stimulator Needle     Needle Length: 9cm  Needle Gauge: 21     Additional Needles:   Procedures: ultrasound guided, nerve stimulator,,,,,,  Narrative:  Start time: 05/18/2017 1:30 PM End time: 05/18/2017 1:45 PM Injection made incrementally with aspirations every 5 mL.  Performed by: Personally  Anesthesiologist: Adele Barthel P  Additional Notes: Functioning IV was confirmed and monitors were applied.  A 27mm 21ga Arrow echogenic stimulator needle was used. Sterile prep,hand hygiene and sterile gloves were used.  Negative aspiration and negative test dose prior to incremental administration of local anesthetic. The patient tolerated the procedure well.  Ultrasound guided saphenous block performed as well

## 2017-05-18 NOTE — Progress Notes (Signed)
[facility]                                                                              Patient Demographics  [name], is a 66 y.o. male, DOB - 14-Jul-1951, DPO:[id number]  Admit date - 05/16/2017   Admitting Physician Waldemar Dickens, MD  Outpatient Primary MD for the patient is [name], MD  Outpatient specialists:   LOS - 2  days    Chief Complaint  Patient presents with  . Wound Check  . Fever       Brief summary   Patient is a 66 year old male with diabetes, ESRD, MWF, CAD, anemia, hypertension, systolic CHF presented to ED from ID clinic with worsening left foot pain and increasing drainage from a nonhealing foot ulcers. Per patient he had developed ulcers from the lateral aspect of his left foot after having his fourth toe amputated. For the last 3 months he had received vancomycin and cefazolin at dialysis and was recently transitioned to doxycycline in the ID clinic. In ED, he had a temp of 101.82F.   Assessment & Plan    Principal Problem:   Osteomyelitis of left foot (HCC) With nonhealing left foot ulcers possibly due to diabetic foot ulcers and underlying PAD - Patient has a history of osteomyelitis in his right foot. Patient received antibiotics via hemodialysis for several weeks prior to admission and was recently transitioned to oral doxycycline. - Patient started on IV vancomycin and Fortaz - MRI of the left foot showed soft tissue ulcer overlying the base of the fifth metatarsal osteomyelitis of the base of the fifth metatarsal along the lateral margin, no abscess. - Orthopedics consulted, recommended ray amputation of the fifth toe after HD today - ID following  Active Problems:   Anemia due to end stage renal disease ([place]) - H&H currently stable    Benign essential HTN - BP currently stable, continue Coreg     ESRD on dialysis [facility]) - Patient on hemodialysis MWF, nephrology consulted - Patient undergoing hemodialysis per  his schedule    Peripheral arterial disease ([place])  - vascular surgery following     Diabetes mellitus with complication ([place]) - ERX5Q 6.7, cont sliding scale insulin    Code Status: Full CODE STATUS  DVT Prophylaxis:Heparin  Family  Communication: Discussed in detail with the patient, all imaging results, lab results explained to the patient   Disposition Plan:  Time Spent in minutes   25 minutes  Procedures:  Hemodialysis   Consultants:   Orthopedics   infectious disease Nephrology  Antimicrobials:   IV vancomycin with hemodialysis  IV Fortaz with hemodialysis    Medications  Scheduled Meds: . [MAR Hold] carvedilol  3.125 mg Oral BID  . [MAR Hold] cinacalcet  120 mg Oral Q supper  . [MAR Hold] doxercalciferol  6 mcg Intravenous Q M,W,F-HD  . [MAR Hold] ferric citrate  210 mg Oral TID WC  . [MAR Hold] insulin aspart  0-5 Units Subcutaneous QHS  . [MAR Hold] insulin aspart  0-9 Units Subcutaneous TID WC  . [MAR Hold] multivitamin  1 tablet Oral QHS  . povidone-iodine  2  application Topical Once   Continuous Infusions: . sodium chloride    . [MAR Hold] cefTAZidime (FORTAZ)  IV Stopped (05/16/17 2337)  . [MAR Hold] vancomycin Stopped (05/18/17 1102)   PRN Meds:.[MAR Hold] acetaminophen **OR** [MAR Hold] acetaminophen, [MAR Hold] ondansetron **OR** [MAR Hold] ondansetron (ZOFRAN) IV   Antibiotics   Anti-infectives    Start     Dose/Rate Route Frequency Ordered Stop   05/18/17 0600  vancomycin (VANCOCIN) IVPB 1000 mg/200 mL premix  Status:  Discontinued     1,000 mg 200 mL/hr over 60 Minutes Intravenous On call to O.R. 05/17/17 2338 05/18/17 1113   05/16/17 2200  vancomycin (VANCOCIN) IVPB 1000 mg/200 mL premix     1,000 mg 200 mL/hr over 60 Minutes Intravenous  Once 05/16/17 2002 05/16/17 2214   05/16/17 2100  vancomycin (VANCOCIN) 2,000 mg in sodium chloride 0.9 % 500 mL IVPB  Status:  Discontinued     2,000 mg 250 mL/hr over 120 Minutes Intravenous  Once  05/16/17 1809 05/16/17 2000   05/16/17 2100  vancomycin (VANCOCIN) IVPB 1000 mg/200 mL premix     1,000 mg 200 mL/hr over 60 Minutes Intravenous  Once 05/16/17 2000 05/16/17 2030   05/16/17 2000  [MAR Hold]  cefTAZidime (FORTAZ) 2 g in dextrose 5 % 50 mL IVPB     (MAR Hold since 05/18/17 1251)   2 g 100 mL/hr over 30 Minutes Intravenous Every M-W-F (2000) 05/16/17 1610     05/16/17 2000  vancomycin (VANCOCIN) IVPB 1000 mg/200 mL premix  Status:  Discontinued     1,000 mg 200 mL/hr over 60 Minutes Intravenous  Once 05/16/17 1633 05/16/17 1809   05/16/17 1615  [MAR Hold]  vancomycin (VANCOCIN) IVPB 1000 mg/200 mL premix     (MAR Hold since 05/18/17 1251)   1,000 mg 200 mL/hr over 60 Minutes Intravenous Every M-W-F (Hemodialysis) 05/16/17 1603     05/16/17 1600  vancomycin (VANCOCIN) 2,000 mg in sodium chloride 0.9 % 500 mL IVPB  Status:  Discontinued     2,000 mg 250 mL/hr over 120 Minutes Intravenous  Once 05/16/17 1603 05/16/17 1609        Subjective:   [name] was seen and examined todayIn hemodialysis. Awaiting surgery today. did not sleep well, feeling bad due to fever and chills overnight. Tmax 100.4 F.   Patient denies dizziness, chest pain, shortness of breath, abdominal pain, N/V/D/C, new weakness, numbess, tingling.  Objective:   Vitals:   05/18/17 1047 05/18/17 1134 05/18/17 1325 05/18/17 1340  BP: 135/63 (!) 119/54 (!) 111/59 (!) 122/53  Pulse: 85 89 91 86  Resp:  18 (!) 26 (!) 21  Temp: (!) 100.4 F (38 C) 100.2 F (37.9 C)    TempSrc: Oral Oral    SpO2: 95% 95% 100% 97%  Weight: 90.5 kg (199 lb 8.3 oz)     Height:        Intake/Output Summary (Last 24 hours) at 05/18/17 1448 Last data filed at 05/18/17 1047  Gross per 24 hour  Intake              300 ml  Output             1000 ml  Net             -700 ml     Wt Readings from Last 3 Encounters:  05/18/17 90.5 kg (199 lb 8.3 oz)  05/16/17 96.2 kg (212 lb)  03/07/17 95.7 kg (211 lb)  Exam  General: Alert and oriented x 3, NAD  HEENT:   Neck: Supple, no JVD  Cardiovascular: S1 S2 auscultated, no rubs, murmurs or gallops. Regular rate and rhythm.  Respiratory: Clear to auscultation bilaterally, no wheezing, rales or rhonchi  Gastrointestinal: Soft, nontender, nondistended, + bowel sounds  Ext: no cyanosis clubbin. Left foot wrapped  neuro: AAOx3, Cr N's II- XII. Strength 5/5 upper and lower extremities bilaterally  Skin:  left foot wrapped  psyc: Normal affect and demeanor, alert and oriented x3    Data Reviewed:  I have personally reviewed following labs and imaging studies  Micro Results Recent Results (from the past 240 hour(s))  Aerobic/Anaerobic Culture (surgical/deep wound)     Status: Abnormal   Collection Time: 05/10/17 11:15 AM  Result Value Ref Range Status   Specimen Description FOOT DEEP TISSUE CULTURE, LEFT LATERAL FOOT  Final   Special Requests NONE  Final   Gram Stain   Final    FEW WBC PRESENT, PREDOMINANTLY PMN FEW GRAM POSITIVE COCCI FEW GRAM NEGATIVE RODS    Culture (A)  Final    MULTIPLE ORGANISMS PRESENT, NONE PREDOMINANT MODERATE BACTEROIDES SPECIES BETA LACTAMASE POSITIVE Performed at [facility], [place] [address] [place], [place] [address]    Report Status 05/15/2017 FINAL  Final  Wound or Superficial Culture     Status: None (Preliminary result)   Collection Time: 05/16/17  2:26 PM  Result Value Ref Range Status   Specimen Description WOUND ULCER  Final   Special Requests NONE  Final   Gram Stain   Final    MODERATE WBC PRESENT, PREDOMINANTLY PMN FEW GRAM POSITIVE COCCI RARE GRAM NEGATIVE RODS    Culture   Final    MODERATE DIPHTHEROIDS(CORYNEBACTERIUM SPECIES) Standardized susceptibility testing for this organism is not available. CULTURE REINCUBATED FOR BETTER GROWTH    Report Status PENDING  Incomplete  MRSA PCR Screening     Status: None   Collection Time: 05/16/17 10:11 PM  Result Value Ref  Range Status   MRSA by PCR NEGATIVE NEGATIVE Final    Comment:        The GeneXpert MRSA Assay (FDA approved for NASAL specimens only), is one component of a comprehensive MRSA colonization surveillance program. It is not intended to diagnose MRSA infection nor to guide or monitor treatment for MRSA infections.   Surgical pcr screen     Status: None   Collection Time: 05/17/17  9:57 PM  Result Value Ref Range Status   MRSA, PCR NEGATIVE NEGATIVE Final   Staphylococcus aureus NEGATIVE NEGATIVE Final    Comment:        The Xpert SA Assay (FDA approved for NASAL specimens in patients over 36 years of age), is one component of a comprehensive surveillance program.  Test performance has been validated by [facility] for patients greater than or equal to 68 year old. It is not intended to diagnose infection nor to guide or monitor treatment.     Radiology Reports Mr Foot Left Wo Contrast  Result Date: 05/17/2017 CLINICAL DATA:  Nonhealing ulcer involving the left foot. EXAM: MRI OF THE LEFT FOOT WITHOUT CONTRAST TECHNIQUE: Multiplanar, multisequence MR imaging of the left forefoot was performed. No intravenous contrast was administered. COMPARISON:  None. FINDINGS: Bones/Joint/Cartilage Soft tissue ulcer overlying the base of the fifth metatarsal. Ulcer extends to the cortex of the base of the fifth metatarsal along the lateral margin with cortical irregularity, underlying bone marrow edema and T1 hypointensity most consistent  with osteomyelitis. Amputation of the fourth phalanx. No acute fracture or dislocation. Normal alignment. No joint effusion. Mild osteoarthritis of the first MTP joint. Mild osteoarthritis of the second MTP joint. Mild osteoarthritis of the navicular- medial cuneiform joint. Ligaments Collateral ligaments are intact.  Lisfranc ligament is intact. Muscles and Tendons Flexor, peroneal and extensor compartment tendons are intact. Generalized muscle atrophy. Soft  tissue No fluid collection or hematoma. No soft tissue mass. Soft tissue edema along the lateral aspect of the foot overlying the fifth metatarsal consistent with cellulitis. IMPRESSION: 1. Soft tissue ulcer overlying the base of the fifth metatarsal. 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 No drainable fluid collection to suggest an abscess. Electronically Signed   By: Kathreen Devoid   On: 05/17/2017 08:15   Dg Chest Port 1 View  Result Date: 05/18/2017 CLINICAL DATA:  Shortness of Breath EXAM: PORTABLE CHEST 1 VIEW COMPARISON:  09/11/2016 FINDINGS: Cardiac shadow is mildly enlarged. The lungs are well aerated bilaterally. No focal infiltrate or sizable effusion is noted. No bony abnormality is noted IMPRESSION: No active disease. Electronically Signed   By: Inez Catalina M.D.   On: 05/18/2017 07:58   Dg Foot Complete Left  Result Date: 05/16/2017 CLINICAL DATA:  Left foot soft tissue wound with drainage and pain and fevers, initial encounter EXAM: LEFT FOOT - COMPLETE 3+ VIEW COMPARISON:  05/03/2017 FINDINGS: Degenerative changes at first MTP joint are noted. Soft tissue defect is again noted at the distal aspect of the fifth toe as well as laterally in the foot. There are some erosive changes identified at the base of the fifth metatarsal which are new from the prior exam consistent with osteomyelitis. Chronic changes in the heads of the second and fourth metatarsals are seen. Changes of prior fourth toe amputation are again noted. IMPRESSION: New area of lucency at the base of the fifth metatarsal. This is consistent with underlying osteomyelitis. Electronically Signed   By: Inez Catalina M.D.   On: 05/16/2017 14:14   Dg Foot Complete Left  Result Date: 05/03/2017 CLINICAL DATA:  66 year old male with chronic left foot wounds EXAM: LEFT FOOT - COMPLETE 3+ VIEW COMPARISON:  Prior radiographs of the left foot 01/04/2017 FINDINGS: Stable postsurgical changes of prior  fourth digit amputation. Sequelae of prior Freiberg's infraction at the head of the second metatarsal are similar compared to prior. No evidence of new bony refraction or due destruction to suggest acute osteomyelitis. Soft tissue irregularity present at the tip of the fifth toe, and laterally adjacent to the base of the fifth metatarsal consistent with ulcerations. Calcifications are present of the digital arteries bilaterally. Degenerative osteoarthritis present in the great toe MTP joint. IMPRESSION: 1. No convincing conventional radiographic evidence of active osteomyelitis. 2. Active soft tissue ulceration at the tip of the fifth digit, and at the lateral aspect of the foot adjacent the base of the fifth metatarsal. 3. Surgical changes of prior fourth toe amputation. 4. 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 5. Small vessel atherosclerotic vascular calcifications. 6. 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 Electronically Signed   By: Jacqulynn Cadet M.D.   On: 05/03/2017 14:23    Lab Data:  CBC:  Recent Labs Lab 05/16/17 1422 05/17/17 0805 05/18/17 0714  WBC 11.2* 7.2 9.0  NEUTROABS 7.7  --   --   HGB 12.4* 12.9* 12.5*  HCT 37.8* 40.7 38.0*  MCV 94.0 96.0 93.1  PLT 268 270 220   Basic Metabolic Panel:  Recent Labs Lab 05/16/17 1422 05/17/17 0805 05/18/17 0714  NA 135 136 131*  K 5.0 4.4 4.5  CL 95* 93* 90*  CO2 25 29 24   GLUCOSE 142* 152* 156*  BUN 67* 33* 48*  CREATININE 13.82* 9.83* 11.94*  CALCIUM 9.3 9.5 8.9  PHOS  --   --  6.9*   GFR: Estimated Creatinine Clearance: 7 mL/min (A) (by C-G formula based on SCr of 11.94 mg/dL (H)). Liver Function Tests:  Recent Labs Lab 05/17/17 0805 05/18/17 0714  AST 13*  --   ALT 9*  --   ALKPHOS 71  --   BILITOT 0.7  --   PROT 8.0  --   ALBUMIN 3.2* 2.9*   No results for input(s): LIPASE, AMYLASE in the last 168 hours. No  results for input(s): AMMONIA in the last 168 hours. Coagulation Profile:  Recent Labs Lab 05/17/17 0805  INR 1.19   Cardiac Enzymes: No results for input(s): CKTOTAL, CKMB, CKMBINDEX, TROPONINI in the last 168 hours. BNP (last 3 results) No results for input(s): PROBNP in the last 8760 hours. HbA1C:  Recent Labs  05/16/17 2020  HGBA1C 6.7*   CBG:  Recent Labs Lab 05/17/17 0749 05/17/17 1223 05/17/17 1607 05/17/17 2202 05/18/17 1130  GLUCAP 153* 187* 163* 153* 121*   Lipid Profile: No results for input(s): CHOL, HDL, LDLCALC, TRIG, CHOLHDL, LDLDIRECT in the last 72 hours. Thyroid Function Tests: No results for input(s): TSH, T4TOTAL, FREET4, T3FREE, THYROIDAB in the last 72 hours. Anemia Panel: No results for input(s): VITAMINB12, FOLATE, FERRITIN, TIBC, IRON, RETICCTPCT in the last 72 hours. Urine analysis:    Component Value Date/Time   COLORURINE YELLOW 09/11/2016 1933   APPEARANCEUR CLEAR 09/11/2016 1933   LABSPEC 1.015 09/11/2016 1933   PHURINE 8.5 (H) 09/11/2016 1933   GLUCOSEU 100 (A) 09/11/2016 1933   HGBUR NEGATIVE 09/11/2016 1933   BILIRUBINUR SMALL (A) 09/11/2016 1933   KETONESUR NEGATIVE 09/11/2016 1933   PROTEINUR 100 (A) 09/11/2016 1933   UROBILINOGEN 0.2 11/22/2014 1740   NITRITE NEGATIVE 09/11/2016 1933   LEUKOCYTESUR NEGATIVE 09/11/2016 1933     Ripudeep Rai M.D. [facility] 05/18/2017, 2:48 PM  Pager: [phone] Between 7am to 7pm - call Pager - [phone]  After 7pm go to [email or web address] - password [email or web address]  Call night coverage person covering after 7pm

## 2017-05-18 NOTE — Anesthesia Procedure Notes (Signed)
Procedure Name: MAC Date/Time: 05/18/2017 2:32 PM Performed by: Everlean Cherry A Pre-anesthesia Checklist: Patient identified, Emergency Drugs available, Suction available and Patient being monitored Patient Re-evaluated:Patient Re-evaluated prior to inductionOxygen Delivery Method: Simple face mask

## 2017-05-19 ENCOUNTER — Encounter (HOSPITAL_COMMUNITY): Payer: Self-pay | Admitting: Orthopedic Surgery

## 2017-05-19 DIAGNOSIS — I420 Dilated cardiomyopathy: Secondary | ICD-10-CM

## 2017-05-19 DIAGNOSIS — Z89432 Acquired absence of left foot: Secondary | ICD-10-CM

## 2017-05-19 LAB — AEROBIC CULTURE W GRAM STAIN (SUPERFICIAL SPECIMEN)

## 2017-05-19 LAB — GLUCOSE, CAPILLARY
GLUCOSE-CAPILLARY: 147 mg/dL — AB (ref 65–99)
Glucose-Capillary: 157 mg/dL — ABNORMAL HIGH (ref 65–99)
Glucose-Capillary: 131 mg/dL — ABNORMAL HIGH (ref 65–99)
Glucose-Capillary: 111 mg/dL — ABNORMAL HIGH (ref 65–99)

## 2017-05-19 LAB — RENAL FUNCTION PANEL
ALBUMIN: 2.9 g/dL — AB (ref 3.5–5.0)
ANION GAP: 14 (ref 5–15)
BUN: 33 mg/dL — ABNORMAL HIGH (ref 6–20)
CHLORIDE: 94 mmol/L — AB (ref 101–111)
CO2: 26 mmol/L (ref 22–32)
Calcium: 8 mg/dL — ABNORMAL LOW (ref 8.9–10.3)
Creatinine, Ser: 9.76 mg/dL — ABNORMAL HIGH (ref 0.61–1.24)
GFR calc Af Amer: 6 mL/min — ABNORMAL LOW (ref >60)
GFR, EST NON AFRICAN AMERICAN: 5 mL/min — AB (ref >60)
Glucose, Bld: 156 mg/dL — ABNORMAL HIGH (ref 65–99)
PHOSPHORUS: 4.9 mg/dL — AB (ref 2.5–4.6)
Potassium: 4.9 mmol/L (ref 3.5–5.1)
Sodium: 134 mmol/L — ABNORMAL LOW (ref 135–145)

## 2017-05-19 LAB — CBC
HCT: 39.1 % (ref 39.0–52.0)
HEMOGLOBIN: 12.3 g/dL — AB (ref 13.0–17.0)
MCH: 30.3 pg (ref 26.0–34.0)
MCHC: 31.5 g/dL (ref 30.0–36.0)
MCV: 96.3 fL (ref 78.0–100.0)
Platelets: 258 10*3/uL (ref 150–400)
RBC: 4.06 MIL/uL — AB (ref 4.22–5.81)
RDW: 16.3 % — ABNORMAL HIGH (ref 11.5–15.5)
WBC: 8.7 10*3/uL (ref 4.0–10.5)

## 2017-05-19 MED ORDER — SENNOSIDES-DOCUSATE SODIUM 8.6-50 MG PO TABS
1.0000 | ORAL_TABLET | Freq: Two times a day (BID) | ORAL | Status: DC
  Administered 2017-05-21 – 2017-05-25 (×6): 1 via ORAL
  Filled 2017-05-19 (×9): qty 1

## 2017-05-19 MED ORDER — SODIUM CHLORIDE 0.9 % IV BOLUS (SEPSIS)
500.0000 mL | Freq: Once | INTRAVENOUS | Status: AC
  Administered 2017-05-19: 500 mL via INTRAVENOUS

## 2017-05-19 MED ORDER — POLYETHYLENE GLYCOL 3350 17 G PO PACK: 17.0000 g | PACK | Freq: Every day | ORAL | Status: DC | PRN

## 2017-05-19 NOTE — Progress Notes (Signed)
[facility] Progress Note    ROUNDING NOTE S/p 5th ray amputation yesterday Fever overnight but says feels "better" than yesterday  Tired after surgery and HD   Objective Vitals:   05/18/17 1635 05/18/17 2115 05/18/17 2345 05/19/17 0548  BP: (!) 94/50 130/64  (!) 108/52  Pulse: 80 83  82  Resp: 18 20  16   Temp: 100.1 F (37.8 C)   (!) 100.5 F (38.1 C)  TempSrc: Oral     SpO2: 96% 99%  94%  Weight:   90.3 kg (199 lb)   Height:       Physical Exam WNWD male NAD  Regular S1S2 No S3 Lungs clear Abd soft and non tender No LE edema L foot wrapped [name] +bruit    Recent Labs Lab 05/17/17 0805 05/18/17 0714 05/19/17 0413  NA 136 131* 134*  K 4.4 4.5 4.9  CL 93* 90* 94*  CO2 29 24 26   GLUCOSE 152* 156* 156*  BUN 33* 48* 33*  CREATININE 9.83* 11.94* 9.76*  CALCIUM 9.5 8.9 8.0*  PHOS  --  6.9* 4.9*    Recent Labs Lab 05/16/17 1422 05/17/17 0805 05/18/17 0714 05/19/17 0413  WBC 11.2* 7.2 9.0 8.7  NEUTROABS 7.7  --   --   --   HGB 12.4* 12.9* 12.5* 12.3*  HCT 37.8* 40.7 38.0* 39.1  MCV 94.0 96.0 93.1 96.3  PLT 268 270 291 258       Component Value Date/Time   SDES WOUND ULCER 05/16/2017 1426   SPECREQUEST NONE 05/16/2017 1426   CULT  05/16/2017 1426    MODERATE DIPHTHEROIDS(CORYNEBACTERIUM SPECIES) Standardized susceptibility testing for this organism is not available. CULTURE REINCUBATED FOR BETTER GROWTH    REPTSTATUS PENDING 05/16/2017 1426    Recent Labs Lab 05/18/17 1130 05/18/17 1536 05/18/17 1637 05/18/17 2116 05/19/17 0815  GLUCAP 121* 124* 114* 141* 157*   Medications: . sodium chloride    . cefTAZidime (FORTAZ)  IV Stopped (05/18/17 2005)  . vancomycin Stopped (05/18/17 1102)   . carvedilol  3.125 mg Oral BID  . cinacalcet  120 mg Oral Q supper  . doxercalciferol  6 mcg Intravenous Q M,W,F-HD  . ferric citrate  210 mg Oral TID WC  . insulin aspart  0-5 Units Subcutaneous QHS  . insulin aspart  0-9 Units  Subcutaneous TID WC  . multivitamin  1 tablet Oral QHS   Dialysis Orders:  MWF [facility] 4.25 hours 425/800 EDW 92.5 kg 2K2.25 Ca L [name] Hectorol 4 mcg Heparin 6000 units (sensipar 60 2/day at home; bider auryxia 2 tid w/meals)  Background:  66 y.o. year-old AAM w/ESRD, DM2, HTN, NICM with sCHF. Has had issues with foot ulcers with osteo of R foot earlier this year treated with V/F/hyperbaric O2. Current admission is for ulceration lateral aspect LEFT foot with associated chills, temp today to 102, foot pain/drainage/foul odor. Plain film with new lucency base of L 5th metatarsal c/w osteo. Admitted for IV ATB's, surgical consultation by Dr. [name]  Assessment/Plan: 1. Osteomyelitis L fifth metatarsal - s/p 5th ray amp 5/23  Vanco/fortaz per Dr. [name], surgical consultation by Dr. [name]; MRI 5/22 - osteo no abscess. Janna Arch cx+ 5/21 Corynebacterium ID following 2. ESRD - MWF  2K bath No heparin. Under EDW. BP soft. Keep even 3. Secondary HPT - hectorol with HD. Sensipar 120/day. [name]. 2ac 4. DM - insulin per primary team 5. 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 HTN/Volume  - usual  meds/ net UF - now below edw post HD wt 5/23 90.5kg   7. PAD - has had prior evaluation for large vessel ds (Dr. Donnetta Hutching) and felt adequate flow to heal amputations   Ogechi Larina Earthly PA-C [place] Pager [phone] 05/19/2017,9:28 AM   I have seen and examined this patient and agree with plan and assessment in the above note with renal recommendations/intervention highlighted.   Dadrian Ballantine B,MD 05/19/2017 12:49 PM

## 2017-05-19 NOTE — Progress Notes (Signed)
Subjective: 1 Day Post-Op Procedure(s) (LRB): 5TH RAY AMPUTATION  and EXCISION CUBOID (Left) Patient reports pain as mild.    Objective: Vital signs in last 24 hours: Temp:  [98.1 F (36.7 C)-100.5 F (38.1 C)] 100.5 F (38.1 C) (05/24 0548) Pulse Rate:  [80-91] 82 (05/24 0548) Resp:  [16-27] 16 (05/24 0548) BP: (83-149)/(50-70) 108/52 (05/24 0548) SpO2:  [94 %-100 %] 94 % (05/24 0548) Weight:  [90.3 kg (199 lb)-90.5 kg (199 lb 8.3 oz)] 90.3 kg (199 lb) (05/23 2345)  Intake/Output from previous day: 05/23 0701 - 05/24 0700 In: 827 [P.O.:402; I.V.:175; IV Piggyback:250] Out: 1270 [Urine:250; Blood:20] Intake/Output this shift: No intake/output data recorded.   Recent Labs  05/16/17 1422 05/17/17 0805 05/18/17 0714 05/19/17 0413  HGB 12.4* 12.9* 12.5* 12.3*    Recent Labs  05/18/17 0714 05/19/17 0413  WBC 9.0 8.7  RBC 4.08* 4.06*  HCT 38.0* 39.1  PLT 291 258    Recent Labs  05/18/17 0714 05/19/17 0413  NA 131* 134*  K 4.5 4.9  CL 90* 94*  CO2 24 26  BUN 48* 33*  CREATININE 11.94* 9.76*  GLUCOSE 156* 156*  CALCIUM 8.9 8.0*    Recent Labs  05/17/17 0805  INR 1.19   Left foot exam: Dressing clean and dry.  No drainage.  Good capillary refill to remaining toes.  Assessment/Plan: 1 Day Post-Op Procedure(s) (LRB): 5TH RAY AMPUTATION  and EXCISION CUBOID (Left)  Plan: Continuerenal dialysis per his usual schedule. May weight-bear as toleratedon the right putting weight on his heel with a postop shoe. We will change the dressing tomorrow and pull his Penrose drain.  From an orthopaedic viewpoint he can be discharged after that.Will need to send home on oral antibiotics.   Vernon G 05/19/2017, 8:57 AM

## 2017-05-19 NOTE — Progress Notes (Signed)
Pharmacy Antibiotic Note  [name]. is a 66 y.o. male admitted on 05/16/2017 with osteomyelitis.  Pharmacy has been consulted for vancomycin and ceftazidime dosing. Patient has ESRD on HD-MWF. He is s/p 5th ray amputation on 5/23.   Plan: Vancomycin 1g IV qHD-MWF Ceftazidime 2g IV qMWF after HD F/u HD schedule and tolerability, cultures, LOT  Height: 6\' 1"  (185.4 cm) Weight: 199 lb (90.3 kg) IBW/kg (Calculated) : 79.9  Temp (24hrs), Avg:99.4 F (37.4 C), Min:98.1 F (36.7 C), Max:100.5 F (38.1 C)   Recent Labs Lab 05/16/17 1319 05/16/17 1422 05/16/17 1550 05/17/17 0805 05/18/17 0714 05/19/17 0413  WBC  --  11.2*  --  7.2 9.0 8.7  CREATININE  --  13.82*  --  9.83* 11.94* 9.76*  LATICACIDVEN 1.47  --  0.59  --   --   --     Estimated Creatinine Clearance: 8.5 mL/min (A) (by C-G formula based on SCr of 9.76 mg/dL (H)).    Allergies  Allergen Reactions  . Penicillins Hives and Swelling    Ankle swelling  PATIENT HAD A PCN REACTION WITH IMMEDIATE RASH, FACIAL/TONGUE/THROAT SWELLING, SOB, OR LIGHTHEADEDNESS WITH HYPOTENSION:  #  #  #  YES  #  #  #   Has patient had a PCN reaction causing severe rash involving mucus membranes or skin necrosis: no Has patient had a PCN reaction that required hospitalization: no Has patient had a PCN reaction occurring within the last 10 years: no If all of the above answers are "NO", then may proceed with Cephalosporin use.    Antimicrobials this admission: 5/21 vanc>> 5/21 ceftazidime>>  Microbiology results: 5/15 WCx: multiple organisms 5/21 WCx: mod diphtheroids 5/23 Tissue Cx: ?pending  Thank you for allowing pharmacy to be a part of this patient's care.   Renold Genta, PharmD, BCPS Clinical Pharmacist Phone for today - [place] - [phone] 05/19/2017 2:09 PM

## 2017-05-19 NOTE — Op Note (Signed)
NAME:  [name], [name]                    ACCOUNT NO.:  MEDICAL RECORD NO.:  [id number]  LOCATION:                                 FACILITY:  PHYSICIAN:  [name], M.D.        DATE OF BIRTH:  DATE OF PROCEDURE:  05/18/2017 DATE OF DISCHARGE:                              OPERATIVE REPORT   He is a 66 year old male on the Orthopedic Surgery Service.  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  POSTOPERATIVE DIAGNOSES: 1. Osteomyelitis of the base of the 5th metatarsal. 2. Osteomyelitis of the cuboid.  PROCEDURES: 1. A 5th ray amputation. 2. Partial excision of the cuboid.  SURGEON:  [name], M.D.  ASSISTANT:  Gary Fleet, P.A.  ANESTHESIA:  General.  BRIEF HISTORY:  [name] is a 66 year old male with a long history of complaints of foul-smelling drainage and open wound over the lateral aspect of his left foot.  He was treated conservatively for period of time and after failure of conservative care, he was taken to the operating room for a 5th ray amputation.  Preoperative MRI showed significant osteomyelitis at the base of the 5th metatarsal.  He was brought to the operating room for 5th ray amputation.  DESCRIPTION OF PROCEDURE:  The patient was brought to the operative room and after adequate anesthesia was obtained with general anesthetic, the patient was placed supine on the operating table.  Left foot prepped and draped in usual sterile fashion.  Following this, an incision was made back to good skin and the 5th ray was amputated.  There was significant necrotic tissue in this area and the cuboid unfortunately was very soft, and felt that was certainly not healthy.  We did a partial excision of the cuboid and gained some ability to close the skin with this as well. Once this was done, we used multiple far-near, near-far stitches to gain some tension of the skin edges and get that closed, and then we used wide staples  to close the remainder of the skin.  We put a Penrose drain underneath the skin to allow for some drainage as needed.  At this point, the wound was pulsatile lavaged with pulsatile lavage irrigation. Prior to closure, we let the tourniquet down right after we did the amputation just to make sure that there were no areas of significant bleeding, but did not see any.  At this point, the sterile compressive dressing was applied.  We padded him up quite well and then did a compressive dressing and wrap of his foot.  We will keep him elevated and check the wound in several days.  The estimated blood loss for the procedure was minimal.  Total tourniquet time was about 8 minutes.     [name], M.D.     Corliss Skains  D:  05/18/2017  T:  05/18/2017  Job:  [id number]

## 2017-05-19 NOTE — Evaluation (Signed)
Physical Therapy Evaluation Patient Details Name: [name] BOYS Sr. MRN: [id number] DOB: 1951-04-24 Today's Date: 05/19/2017   History of Present Illness  [name] CUMPIAN Sr. is a pleasant 66 y.o. male with PMH significant for diabetes type 2 end-stage renal disease M-W-F dialysis schedule, CAD, anemia, hypertension, systolic heart failure presents to the emergency department from infectious disease clinic with the chief complaint of worsening left foot pain/increased drainage from a nonhealing foot ulcers. Initial evaluation concerning for osteomyelitis. Pt s/p 5th ray amputation and partial excision of cuboid (05/18/17)    Clinical Impression  Pt admitted with above diagnosis. Pt currently with functional limitations due to the deficits listed below (see PT Problem List). Pt lives in [place] and will need stair training prior to discharge.  Discussed stair training and technique with pt, but was not up to attempt, nor was wife here to observe.  Pt also reporting concern over worsening L hand tremor and beginnings of R hand tremor which is causing him difficulty with things like insulin injection. He would benefit from an OT eval. Pt will benefit from skilled PT to increase their independence and safety with mobility to allow discharge to the venue listed below.  Recommend HHPT and RW for home use.    Follow Up Recommendations Home health PT;Supervision for mobility/OOB    Equipment Recommendations  Rolling walker with 5" wheels    Recommendations for Other Services OT consult     Precautions / Restrictions Restrictions Weight Bearing Restrictions: Yes LLE Weight Bearing: Weight bearing as tolerated (heel only with post-op shoe)      Mobility  Bed Mobility Overal bed mobility: Modified Independent                Transfers Overall transfer level: Needs assistance Equipment used: Rolling walker (2 wheeled) Transfers: Sit to/from Stand Sit to Stand: Min guard          General transfer comment: min/guard for safety and with cues for proper WB adherence  Ambulation/Gait Ambulation/Gait assistance: Min guard Ambulation Distance (Feet): 45 Feet Assistive device: Rolling walker (2 wheeled) Gait Pattern/deviations: Step-to pattern;Decreased stance time - left;Antalgic Gait velocity: decreased   General Gait Details: Cues for proper use of RW and for WB through heel only.  Pt reports pain increased to 6/10 with gait.  Stairs Stairs:  (discussed stairs entering with RW and possible of sideways technique for interior stairs)          Wheelchair Mobility    Modified Rankin (Stroke Patients Only)       Balance Overall balance assessment: Needs assistance   Sitting balance-Leahy Scale: Good       Standing balance-Leahy Scale: Fair                               Pertinent Vitals/Pain Pain Assessment: 0-10 Pain Score: 3  Pain Location: L foot lateral side and behind ankle Pain Descriptors / Indicators: Aching    Home Living Family/patient expects to be discharged to:: Private residence     Type of Home: House Home Access: Stairs to enter Entrance Stairs-Rails: None Technical brewer of Steps: 2 Home Layout: Multi-level Home Equipment: None      Prior Function Level of Independence: Independent               Hand Dominance   Dominant Hand: Right    Extremity/Trunk Assessment   Upper Extremity Assessment Upper Extremity Assessment: Overall  WFL for tasks assessed;LUE deficits/detail LUE Deficits / Details: tremor noted    Lower Extremity Assessment Lower Extremity Assessment: Overall WFL for tasks assessed;LLE deficits/detail LLE Deficits / Details: Wrapped due to surgery       Communication   Communication: No difficulties  Cognition Arousal/Alertness: Awake/alert Behavior During Therapy: WFL for tasks assessed/performed Overall Cognitive Status: Within Functional Limits for tasks  assessed                                 General Comments: Pt asking good questions regarding medical condition      General Comments General comments (skin integrity, edema, etc.): L foot wrapped.  Pt reports L hand tremor that is getting worse and feels it is starting in R hand.      Exercises     Assessment/Plan    PT Assessment Patient needs continued PT services  PT Problem List Decreased activity tolerance;Decreased balance;Decreased mobility;Decreased knowledge of use of DME;Decreased safety awareness;Decreased knowledge of precautions       PT Treatment Interventions DME instruction;Gait training;Stair training;Functional mobility training;Therapeutic activities;Therapeutic exercise;Balance training    PT Goals (Current goals can be found in the Care Plan section)  Acute Rehab PT Goals Patient Stated Goal: to return home PT Goal Formulation: With patient Time For Goal Achievement: 06/02/17 Potential to Achieve Goals: Good    Frequency Min 3X/week   Barriers to discharge Inaccessible home environment flight of stairs to bedroom    Co-evaluation               AM-PAC PT "6 Clicks" Daily Activity  Outcome Measure Difficulty turning over in bed (including adjusting bedclothes, sheets and blankets)?: None Difficulty moving from lying on back to sitting on the side of the bed? : None Difficulty sitting down on and standing up from a chair with arms (e.g., wheelchair, bedside commode, etc,.)?: Total Help needed moving to and from a bed to chair (including a wheelchair)?: A Little Help needed walking in hospital room?: A Little Help needed climbing 3-5 steps with a railing? : A Lot 6 Click Score: 17    End of Session Equipment Utilized During Treatment: Gait belt;Other (comment) (post op shoe) Activity Tolerance: Patient limited by fatigue;Patient limited by pain Patient left: in chair;with call bell/phone within reach Nurse Communication: Mobility  status PT Visit Diagnosis: Other abnormalities of gait and mobility (R26.89);Difficulty in walking, not elsewhere classified (R26.2);Pain Pain - Right/Left: Left Pain - part of body: Ankle and joints of foot    Time: 1315-1356 PT Time Calculation (min) (ACUTE ONLY): 41 min   Charges:   PT Evaluation $PT Eval Moderate Complexity: 1 Procedure PT Treatments $Gait Training: 23-37 mins   PT G Codes:        Dejanique Ruehl L. Tamala Julian, [place] Pager [phone] 05/19/2017   Galen Manila 05/19/2017, 2:18 PM

## 2017-05-19 NOTE — Progress Notes (Signed)
[facility]                                                                              Patient Demographics  [name], is a 66 y.o. male, DOB - 02/25/1951, KGM:[id number]  Admit date - 05/16/2017   Admitting Physician Waldemar Dickens, MD  Outpatient Primary MD for the patient is [name], MD  Outpatient specialists:   LOS - 3  days    Chief Complaint  Patient presents with  . Wound Check  . Fever       Brief summary   Patient is a 66 year old male with diabetes, ESRD, MWF, CAD, anemia, hypertension, systolic CHF presented to ED from ID clinic with worsening left foot pain and increasing drainage from a nonhealing foot ulcers. Per patient he had developed ulcers from the lateral aspect of his left foot after having his fourth toe amputated. For the last 3 months he had received vancomycin and cefazolin at dialysis and was recently transitioned to doxycycline in the ID clinic. In ED, he had a temp of 101.6F.   Assessment & Plan    Principal Problem:   Osteomyelitis of left foot (HCC) With nonhealing left foot ulcers possibly due to diabetic foot ulcers and underlying PAD - Patient has a history of osteomyelitis in his right foot. Patient received antibiotics via hemodialysis for several weeks prior to admission and was recently transitioned to oral doxycycline. - MRI of the left foot showed soft tissue ulcer overlying the base of the fifth metatarsal osteomyelitis of the base of the fifth metatarsal along the lateral margin, no abscess. - Orthopedics and ID was consulted.  - Patient underwent left fifth ray amputation and excision on 5/23, postop day #1 - Continue IV vancomycin and Fortaz with HD, follow cultures - PTOT evaluation  Active Problems:   Anemia due to end stage renal disease ([place]) - H&H currently stable, 12.3, monitor closely    Benign essential HTN - BP currently borderline hypotensive, symptomatic, will give 500cc fluid bolus and  hold Coreg today      ESRD on dialysis [facility]) - Patient on hemodialysis MWF, nephrology consulted - Patient undergoing hemodialysis per his schedule    Peripheral arterial disease ([place])  - vascular surgery following     Diabetes mellitus with complication ([place]) - UYQ0H 6.7, cont sliding scale insulin    Code Status: Full CODE STATUS  DVT Prophylaxis:Heparin  Family  Communication: Discussed in detail with the patient, all imaging results, lab results explained to the patient and patient's wife at the bedside  Disposition Plan: Possible DC home tomorrow after HD  Time Spent in minutes   25 minutes  Procedures:  Hemodialysis   Consultants:   Orthopedics   infectious disease Nephrology  Antimicrobials:   IV vancomycin with hemodialysis  IV Fortaz with hemodialysis    Medications  Scheduled Meds: . cinacalcet  120 mg Oral Q supper  . doxercalciferol  6 mcg Intravenous Q M,W,F-HD  . ferric citrate  210 mg Oral TID WC  . insulin aspart  0-5 Units Subcutaneous QHS  . insulin aspart  0-9 Units Subcutaneous TID WC  .  multivitamin  1 tablet Oral QHS   Continuous Infusions: . sodium chloride    . cefTAZidime (FORTAZ)  IV Stopped (05/18/17 2005)  . vancomycin Stopped (05/18/17 1102)   PRN Meds:.acetaminophen **OR** acetaminophen, HYDROcodone-acetaminophen, ondansetron **OR** ondansetron (ZOFRAN) IV   Antibiotics   Anti-infectives    Start     Dose/Rate Route Frequency Ordered Stop   05/18/17 0600  vancomycin (VANCOCIN) IVPB 1000 mg/200 mL premix  Status:  Discontinued     1,000 mg 200 mL/hr over 60 Minutes Intravenous On call to O.R. 05/17/17 2338 05/18/17 1113   05/16/17 2200  vancomycin (VANCOCIN) IVPB 1000 mg/200 mL premix     1,000 mg 200 mL/hr over 60 Minutes Intravenous  Once 05/16/17 2002 05/16/17 2214   05/16/17 2100  vancomycin (VANCOCIN) 2,000 mg in sodium chloride 0.9 % 500 mL IVPB  Status:  Discontinued     2,000 mg 250 mL/hr over 120 Minutes  Intravenous  Once 05/16/17 1809 05/16/17 2000   05/16/17 2100  vancomycin (VANCOCIN) IVPB 1000 mg/200 mL premix     1,000 mg 200 mL/hr over 60 Minutes Intravenous  Once 05/16/17 2000 05/16/17 2030   05/16/17 2000  cefTAZidime (FORTAZ) 2 g in dextrose 5 % 50 mL IVPB     2 g 100 mL/hr over 30 Minutes Intravenous Every M-W-F (2000) 05/16/17 1610     05/16/17 2000  vancomycin (VANCOCIN) IVPB 1000 mg/200 mL premix  Status:  Discontinued     1,000 mg 200 mL/hr over 60 Minutes Intravenous  Once 05/16/17 1633 05/16/17 1809   05/16/17 1615  vancomycin (VANCOCIN) IVPB 1000 mg/200 mL premix     1,000 mg 200 mL/hr over 60 Minutes Intravenous Every M-W-F (Hemodialysis) 05/16/17 1603     05/16/17 1600  vancomycin (VANCOCIN) 2,000 mg in sodium chloride 0.9 % 500 mL IVPB  Status:  Discontinued     2,000 mg 250 mL/hr over 120 Minutes Intravenous  Once 05/16/17 1603 05/16/17 1609        Subjective:   Zakari Bathe was seen and examined today. Feels a lot better today after the surgery. Pain is controlled. No fevers this morning. BP however is running low, feeling somewhat dizzy.  Patient denies chest pain, shortness of breath, abdominal pain, N/V, new weakness, numbess, tingling.  Objective:   Vitals:   05/18/17 2345 05/19/17 0548 05/19/17 1028 05/19/17 1040  BP:  (!) 108/52 (!) 81/45 (!) 85/44  Pulse:  82 96   Resp:  16 18   Temp:  (!) 100.5 F (38.1 C) 98.6 F (37 C)   TempSrc:   Oral   SpO2:  94% 99%   Weight: 90.3 kg (199 lb)     Height:        Intake/Output Summary (Last 24 hours) at 05/19/17 1058 Last data filed at 05/19/17 1030  Gross per 24 hour  Intake             1187 ml  Output              270 ml  Net              917 ml     Wt Readings from Last 3 Encounters:  05/18/17 90.3 kg (199 lb)  05/16/17 96.2 kg (212 lb)  03/07/17 95.7 kg (211 lb)     Exam  General: Alert and oriented 3 NAD  HEENT:   Neck: No JVD   Cardiovascular: S1 and S2 clear, no MRG, regular  rate and rhythm  Respiratory: CTA B  Gastrointestinal: Soft, NT, ND, NBS  Ext: no cyanosis clubbin. Left foot dressing intact with drain  neuro: No new deficits  Skin:  left foot dressing intact  psyc: Normal affect and demeanor, alert and oriented x3    Data Reviewed:  I have personally reviewed following labs and imaging studies  Micro Results Recent Results (from the past 240 hour(s))  Aerobic/Anaerobic Culture (surgical/deep wound)     Status: Abnormal   Collection Time: 05/10/17 11:15 AM  Result Value Ref Range Status   Specimen Description FOOT DEEP TISSUE CULTURE, LEFT LATERAL FOOT  Final   Special Requests NONE  Final   Gram Stain   Final    FEW WBC PRESENT, PREDOMINANTLY PMN FEW GRAM POSITIVE COCCI FEW GRAM NEGATIVE RODS    Culture (A)  Final    MULTIPLE ORGANISMS PRESENT, NONE PREDOMINANT MODERATE BACTEROIDES SPECIES BETA LACTAMASE POSITIVE Performed at [facility], [place] [address] [place], [place] [address]    Report Status 05/15/2017 FINAL  Final  Wound or Superficial Culture     Status: None (Preliminary result)   Collection Time: 05/16/17  2:26 PM  Result Value Ref Range Status   Specimen Description WOUND ULCER  Final   Special Requests NONE  Final   Gram Stain   Final    MODERATE WBC PRESENT, PREDOMINANTLY PMN FEW GRAM POSITIVE COCCI RARE GRAM NEGATIVE RODS    Culture   Final    MODERATE DIPHTHEROIDS(CORYNEBACTERIUM SPECIES) Standardized susceptibility testing for this organism is not available. CULTURE REINCUBATED FOR BETTER GROWTH    Report Status PENDING  Incomplete  MRSA PCR Screening     Status: None   Collection Time: 05/16/17 10:11 PM  Result Value Ref Range Status   MRSA by PCR NEGATIVE NEGATIVE Final    Comment:        The GeneXpert MRSA Assay (FDA approved for NASAL specimens only), is one component of a comprehensive MRSA colonization surveillance program. It is not intended to diagnose MRSA infection nor to guide  or monitor treatment for MRSA infections.   Surgical pcr screen     Status: None   Collection Time: 05/17/17  9:57 PM  Result Value Ref Range Status   MRSA, PCR NEGATIVE NEGATIVE Final   Staphylococcus aureus NEGATIVE NEGATIVE Final    Comment:        The Xpert SA Assay (FDA approved for NASAL specimens in patients over 29 years of age), is one component of a comprehensive surveillance program.  Test performance has been validated by [facility] for patients greater than or equal to 6 year old. It is not intended to diagnose infection nor to guide or monitor treatment.     Radiology Reports Mr Foot Left Wo Contrast  Result Date: 05/17/2017 CLINICAL DATA:  Nonhealing ulcer involving the left foot. EXAM: MRI OF THE LEFT FOOT WITHOUT CONTRAST TECHNIQUE: Multiplanar, multisequence MR imaging of the left forefoot was performed. No intravenous contrast was administered. COMPARISON:  None. FINDINGS: Bones/Joint/Cartilage Soft tissue ulcer overlying the base of the fifth metatarsal. Ulcer extends to the cortex of the base of the fifth metatarsal along the lateral margin with cortical irregularity, underlying bone marrow edema and T1 hypointensity most consistent with osteomyelitis. Amputation of the fourth phalanx. No acute fracture or dislocation. Normal alignment. No joint effusion. Mild osteoarthritis of the first MTP joint. Mild osteoarthritis of the second MTP joint. Mild osteoarthritis of the navicular- medial cuneiform joint. Ligaments Collateral ligaments are intact.  Lisfranc  ligament is intact. Muscles and Tendons Flexor, peroneal and extensor compartment tendons are intact. Generalized muscle atrophy. Soft tissue No fluid collection or hematoma. No soft tissue mass. Soft tissue edema along the lateral aspect of the foot overlying the fifth metatarsal consistent with cellulitis. IMPRESSION: 1. Soft tissue ulcer overlying the base of the fifth metatarsal. 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 No drainable fluid collection to suggest an abscess. Electronically Signed   By: Kathreen Devoid   On: 05/17/2017 08:15   Dg Chest Port 1 View  Result Date: 05/18/2017 CLINICAL DATA:  Shortness of Breath EXAM: PORTABLE CHEST 1 VIEW COMPARISON:  09/11/2016 FINDINGS: Cardiac shadow is mildly enlarged. The lungs are well aerated bilaterally. No focal infiltrate or sizable effusion is noted. No bony abnormality is noted IMPRESSION: No active disease. Electronically Signed   By: Inez Catalina M.D.   On: 05/18/2017 07:58   Dg Foot Complete Left  Result Date: 05/16/2017 CLINICAL DATA:  Left foot soft tissue wound with drainage and pain and fevers, initial encounter EXAM: LEFT FOOT - COMPLETE 3+ VIEW COMPARISON:  05/03/2017 FINDINGS: Degenerative changes at first MTP joint are noted. Soft tissue defect is again noted at the distal aspect of the fifth toe as well as laterally in the foot. There are some erosive changes identified at the base of the fifth metatarsal which are new from the prior exam consistent with osteomyelitis. Chronic changes in the heads of the second and fourth metatarsals are seen. Changes of prior fourth toe amputation are again noted. IMPRESSION: New area of lucency at the base of the fifth metatarsal. This is consistent with underlying osteomyelitis. Electronically Signed   By: Inez Catalina M.D.   On: 05/16/2017 14:14   Dg Foot Complete Left  Result Date: 05/03/2017 CLINICAL DATA:  66 year old male with chronic left foot wounds EXAM: LEFT FOOT - COMPLETE 3+ VIEW COMPARISON:  Prior radiographs of the left foot 01/04/2017 FINDINGS: Stable postsurgical changes of prior fourth digit amputation. Sequelae of prior Freiberg's infraction at the head of the second metatarsal are similar compared to prior. No evidence of new bony refraction or due destruction to suggest acute osteomyelitis. Soft tissue irregularity present at the tip of the fifth toe, and  laterally adjacent to the base of the fifth metatarsal consistent with ulcerations. Calcifications are present of the digital arteries bilaterally. Degenerative osteoarthritis present in the great toe MTP joint. IMPRESSION: 1. No convincing conventional radiographic evidence of active osteomyelitis. 2. Active soft tissue ulceration at the tip of the fifth digit, and at the lateral aspect of the foot adjacent the base of the fifth metatarsal. 3. Surgical changes of prior fourth toe amputation. 4. 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 5. Small vessel atherosclerotic vascular calcifications. 6. 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 Electronically Signed   By: Jacqulynn Cadet M.D.   On: 05/03/2017 14:23    Lab Data:  CBC:  Recent Labs Lab 05/16/17 1422 05/17/17 0805 05/18/17 0714 05/19/17 0413  WBC 11.2* 7.2 9.0 8.7  NEUTROABS 7.7  --   --   --   HGB 12.4* 12.9* 12.5* 12.3*  HCT 37.8* 40.7 38.0* 39.1  MCV 94.0 96.0 93.1 96.3  PLT 268 270 291 846   Basic Metabolic Panel:  Recent Labs Lab 05/16/17 1422 05/17/17 0805 05/18/17 0714 05/19/17 0413  NA 135 136 131* 134*  K 5.0 4.4  4.5 4.9  CL 95* 93* 90* 94*  CO2 25 29 24 26   GLUCOSE 142* 152* 156* 156*  BUN 67* 33* 48* 33*  CREATININE 13.82* 9.83* 11.94* 9.76*  CALCIUM 9.3 9.5 8.9 8.0*  PHOS  --   --  6.9* 4.9*   GFR: Estimated Creatinine Clearance: 8.5 mL/min (A) (by C-G formula based on SCr of 9.76 mg/dL (H)). Liver Function Tests:  Recent Labs Lab 05/17/17 0805 05/18/17 0714 05/19/17 0413  AST 13*  --   --   ALT 9*  --   --   ALKPHOS 71  --   --   BILITOT 0.7  --   --   PROT 8.0  --   --   ALBUMIN 3.2* 2.9* 2.9*   No results for input(s): LIPASE, AMYLASE in the last 168 hours. No results for input(s): AMMONIA in the last 168 hours. Coagulation Profile:  Recent Labs Lab 05/17/17 0805  INR 1.19   Cardiac  Enzymes: No results for input(s): CKTOTAL, CKMB, CKMBINDEX, TROPONINI in the last 168 hours. BNP (last 3 results) No results for input(s): PROBNP in the last 8760 hours. HbA1C:  Recent Labs  05/16/17 2020  HGBA1C 6.7*   CBG:  Recent Labs Lab 05/18/17 1130 05/18/17 1536 05/18/17 1637 05/18/17 2116 05/19/17 0815  GLUCAP 121* 124* 114* 141* 157*   Lipid Profile: No results for input(s): CHOL, HDL, LDLCALC, TRIG, CHOLHDL, LDLDIRECT in the last 72 hours. Thyroid Function Tests: No results for input(s): TSH, T4TOTAL, FREET4, T3FREE, THYROIDAB in the last 72 hours. Anemia Panel: No results for input(s): VITAMINB12, FOLATE, FERRITIN, TIBC, IRON, RETICCTPCT in the last 72 hours. Urine analysis:    Component Value Date/Time   COLORURINE YELLOW 09/11/2016 1933   APPEARANCEUR CLEAR 09/11/2016 1933   LABSPEC 1.015 09/11/2016 1933   PHURINE 8.5 (H) 09/11/2016 1933   GLUCOSEU 100 (A) 09/11/2016 1933   HGBUR NEGATIVE 09/11/2016 1933   BILIRUBINUR SMALL (A) 09/11/2016 1933   KETONESUR NEGATIVE 09/11/2016 1933   PROTEINUR 100 (A) 09/11/2016 1933   UROBILINOGEN 0.2 11/22/2014 1740   NITRITE NEGATIVE 09/11/2016 1933   LEUKOCYTESUR NEGATIVE 09/11/2016 1933     Adrienna Karis M.D. [facility] 05/19/2017, 10:58 AM  Pager: [phone] Between 7am to 7pm - call Pager - [phone]  After 7pm go to [email or web address] - password [email or web address]  Call night coverage person covering after 7pm

## 2017-05-19 NOTE — Progress Notes (Signed)
[facility]    Date of Admission:  05/16/2017   Total days of antibiotics 4        Day 4 ceftaz/vanco           ID: [name] Sages Sr. is a 66 y.o. male with ESRD on HD and PAD with osteomyelitis of left foot POD#1 from 5th ray amputation and partial excision of the cuboid Principal Problem:   Osteomyelitis of left foot ([place]) Active Problems:   Anemia due to end stage renal disease (HCC)   Cardiomegaly   Benign essential HTN   Congestive dilated cardiomyopathy (HCC)   ESRD on dialysis ([place])   Peripheral arterial disease ([place])   ESRD on hemodialysis ([place])   Diabetes mellitus with complication (HCC)    Subjective: Chills and episode of dizziness in setting of hypotension. He states he received fluids and feeling better  Medications:  . cinacalcet  120 mg Oral Q supper  . doxercalciferol  6 mcg Intravenous Q M,W,F-HD  . ferric citrate  210 mg Oral TID WC  . insulin aspart  0-5 Units Subcutaneous QHS  . insulin aspart  0-9 Units Subcutaneous TID WC  . multivitamin  1 tablet Oral QHS  . senna-docusate  1 tablet Oral BID    Objective: Vital signs in last 24 hours: Temp:  [98.6 F (37 C)-102.3 F (39.1 C)] 102.3 F (39.1 C) (05/24 2018) Pulse Rate:  [82-96] 83 (05/24 2018) Resp:  [16-20] 17 (05/24 2018) BP: (81-166)/(44-64) 135/58 (05/24 2018) SpO2:  [94 %-99 %] 97 % (05/24 2018) Weight:  [199 lb (90.3 kg)] 199 lb (90.3 kg) (05/23 2345)  Physical Exam  Constitutional: He is oriented to person, place, and time. He appears fatigued No distress.  HENT:  Mouth/Throat: Oropharynx is clear and moist. No oropharyngeal exudate.  Cardiovascular: Normal rate, regular rhythm and normal heart sounds. Exam reveals no gallop and no friction rub.  No murmur heard.  Pulmonary/Chest: Effort normal and breath sounds normal. No respiratory distress. He has no wheezes.  Abdominal: Soft. Bowel sounds are normal. He exhibits no distension. There is no  tenderness.  Ext: left foot is wrapped Skin: Skin is warm and dry. No rash noted. No erythema.  Psychiatric: He has a normal mood and affect. His behavior is normal.     Lab Results  Recent Labs  05/18/17 0714 05/19/17 0413  WBC 9.0 8.7  HGB 12.5* 12.3*  HCT 38.0* 39.1  NA 131* 134*  K 4.5 4.9  CL 90* 94*  CO2 24 26  BUN 48* 33*  CREATININE 11.94* 9.76*   Liver Panel  Recent Labs  05/17/17 0805 05/18/17 0714 05/19/17 0413  PROT 8.0  --   --   ALBUMIN 3.2* 2.9* 2.9*  AST 13*  --   --   ALT 9*  --   --   ALKPHOS 71  --   --   BILITOT 0.7  --   --     Microbiology: 5/21 wound cx -multiple organism Studies/Results: Dg Chest Port 1 View  Result Date: 05/18/2017 CLINICAL DATA:  Shortness of Breath EXAM: PORTABLE CHEST 1 VIEW COMPARISON:  09/11/2016 FINDINGS: Cardiac shadow is mildly enlarged. The lungs are well aerated bilaterally. No focal infiltrate or sizable effusion is noted. No bony abnormality is noted IMPRESSION: No active disease. Electronically Signed   By: Inez Catalina M.D.   On: 05/18/2017 07:58     Assessment/Plan: Osteomyelitis of left foot s/p 5th ray amputation  and partial cuboid amputation = continue on vanco and ceftaz since may have some residual affected bone (cuboid) involvement. Would recommend 4 wk IV therapy  Fever = he may declare himself later today since he is reporting chills. Will recommend to repeat blood cx  Baxter Flattery [facility] Infectious Diseases Cell: [phone] Pager: [phone]  05/19/2017, 8:24 PM

## 2017-05-20 LAB — RENAL FUNCTION PANEL
ALBUMIN: 2.8 g/dL — AB (ref 3.5–5.0)
ANION GAP: 16 — AB (ref 5–15)
BUN: 51 mg/dL — ABNORMAL HIGH (ref 6–20)
CALCIUM: 7.9 mg/dL — AB (ref 8.9–10.3)
CO2: 21 mmol/L — AB (ref 22–32)
Chloride: 95 mmol/L — ABNORMAL LOW (ref 101–111)
Creatinine, Ser: 13.21 mg/dL — ABNORMAL HIGH (ref 0.61–1.24)
GFR, EST AFRICAN AMERICAN: 4 mL/min — AB (ref >60)
GFR, EST NON AFRICAN AMERICAN: 3 mL/min — AB (ref >60)
Glucose, Bld: 113 mg/dL — ABNORMAL HIGH (ref 65–99)
PHOSPHORUS: 5.5 mg/dL — AB (ref 2.5–4.6)
Potassium: 4.4 mmol/L (ref 3.5–5.1)
SODIUM: 132 mmol/L — AB (ref 135–145)

## 2017-05-20 LAB — CBC
HCT: 37.3 % — ABNORMAL LOW (ref 39.0–52.0)
HEMOGLOBIN: 12.1 g/dL — AB (ref 13.0–17.0)
MCH: 30.3 pg (ref 26.0–34.0)
MCHC: 32.4 g/dL (ref 30.0–36.0)
MCV: 93.5 fL (ref 78.0–100.0)
PLATELETS: 251 10*3/uL (ref 150–400)
RBC: 3.99 MIL/uL — AB (ref 4.22–5.81)
RDW: 16 % — ABNORMAL HIGH (ref 11.5–15.5)
WBC: 10.3 10*3/uL (ref 4.0–10.5)

## 2017-05-20 LAB — GLUCOSE, CAPILLARY
GLUCOSE-CAPILLARY: 92 mg/dL (ref 65–99)
GLUCOSE-CAPILLARY: 134 mg/dL — AB (ref 65–99)
Glucose-Capillary: 149 mg/dL — ABNORMAL HIGH (ref 65–99)

## 2017-05-20 MED ORDER — VANCOMYCIN HCL IN DEXTROSE 1-5 GM/200ML-% IV SOLN
INTRAVENOUS | Status: AC
  Administered 2017-05-20: 1000 mg via INTRAVENOUS
  Filled 2017-05-20: qty 200

## 2017-05-20 MED ORDER — HEPARIN SODIUM (PORCINE) 1000 UNIT/ML DIALYSIS: 6000.0000 [IU] | Freq: Once | INTRAMUSCULAR | Status: DC

## 2017-05-20 MED ORDER — SODIUM CHLORIDE 0.9 % IV SOLN: 100.0000 mL | INTRAVENOUS | Status: DC | PRN

## 2017-05-20 MED ORDER — SODIUM CHLORIDE 0.9 % IV SOLN
500.0000 mg | INTRAVENOUS | Status: DC
  Administered 2017-05-21: 500 mg via INTRAVENOUS
  Filled 2017-05-20: qty 0.5

## 2017-05-20 MED ORDER — SODIUM CHLORIDE 0.9 % IV SOLN
500.0000 mg | INTRAVENOUS | Status: DC
  Filled 2017-05-20: qty 0.5

## 2017-05-20 MED ORDER — LIDOCAINE HCL (PF) 1 % IJ SOLN: 5.0000 mL | INTRAMUSCULAR | Status: DC | PRN

## 2017-05-20 MED ORDER — LIDOCAINE-PRILOCAINE 2.5-2.5 % EX CREA: 1.0000 "application " | TOPICAL_CREAM | CUTANEOUS | Status: DC | PRN

## 2017-05-20 MED ORDER — HEPARIN SODIUM (PORCINE) 1000 UNIT/ML DIALYSIS: 1000.0000 [IU] | INTRAMUSCULAR | Status: DC | PRN

## 2017-05-20 MED ORDER — DOXERCALCIFEROL 4 MCG/2ML IV SOLN
INTRAVENOUS | Status: AC
Start: 2017-05-20 — End: 2017-05-20
  Filled 2017-05-20: qty 4

## 2017-05-20 MED ORDER — SODIUM CHLORIDE 0.9 % IV SOLN
500.0000 mg | Freq: Once | INTRAVENOUS | Status: AC
  Administered 2017-05-20: 500 mg via INTRAVENOUS
  Filled 2017-05-20: qty 0.5

## 2017-05-20 MED ORDER — PENTAFLUOROPROP-TETRAFLUOROETH EX AERO: 1.0000 "application " | INHALATION_SPRAY | CUTANEOUS | Status: DC | PRN

## 2017-05-20 MED ORDER — HYDROCODONE-ACETAMINOPHEN 5-325 MG PO TABS
ORAL_TABLET | ORAL | Status: AC
  Filled 2017-05-20: qty 2

## 2017-05-20 NOTE — Progress Notes (Signed)
PT Cancellation Note  Patient Details Name: [name] MILIAN Sr. MRN: [id number] DOB: 04-10-1951   Cancelled Treatment:    Reason Eval/Treat Not Completed: Patient at procedure or test/unavailable (pt at HD. Will follow. )   Philomena Doheny 05/20/2017, 11:04 AM [phone]

## 2017-05-20 NOTE — Progress Notes (Addendum)
Subjective: 2 Days Post-Op Procedure(s) (LRB): 5TH RAY AMPUTATION  and EXCISION CUBOID (Left) Patient reports pain as mild. Complains of some burning pain around his great toe. Some chills. Some tremor in his hands.   Objective: Vital signs in last 24 hours: Temp:  [99 F (37.2 C)-102.3 F (39.1 C)] (P) 99 F (37.2 C) (05/25 0930) Pulse Rate:  [80-90] 80 (05/25 0930) Resp:  [17-18] 18 (05/25 0813) BP: (84-166)/(42-87) 84/42 (05/25 0930) SpO2:  [97 %-100 %] 100 % (05/25 0813) Weight:  [90 kg (198 lb 6.6 oz)] 90 kg (198 lb 6.6 oz) (05/25 0813)  Intake/Output from previous day: 05/24 0701 - 05/25 0700 In: 960 [P.O.:960] Out: 0  Intake/Output this shift: No intake/output data recorded.   Recent Labs  05/18/17 0714 05/19/17 0413 05/20/17 0815  HGB 12.5* 12.3* 12.1*    Recent Labs  05/19/17 0413 05/20/17 0815  WBC 8.7 10.3  RBC 4.06* 3.99*  HCT 39.1 37.3*  PLT 258 251    Recent Labs  05/19/17 0413 05/20/17 0815  NA 134* 132*  K 4.9 4.4  CL 94* 95*  CO2 26 21*  BUN 33* 51*  CREATININE 9.76* 13.21*  GLUCOSE 156* 113*  CALCIUM 8.0* 7.9*   No results for input(s): LABPT, INR in the last 72 hours. Left foot exam: Left foot dressing is changed. Penrose drain is pulled. Overall his left foot wound looks benign and skin edges are well approximated. No abnormalities of the great toe or second toe. Skin looks benign. The small scabbed area on the dorsum of the foot looks fine without sign of infection.   Assessment/Plan: 2 Days Post-Op Procedure(s) (LRB): 5TH RAY AMPUTATION  and EXCISION CUBOID (Left) Plan: Left foot dressing changed and Penrose drain pulled today. Antibiotic treatment per infectious disease. He may weight-bear as tolerated on the left foot using the postop diabetic shoe putting most of his weight on his heel. He will need to see Dr. [name] in the office in 10 days. We will leave his dressing intact until then unless it drains through. We will  sign off. Elza Rafter PA-C and Dr. Lynann Bologna are on call this weekend for the group until Tuesday. Okay for discharge from an orthopedic viewpoint anytime.  [name] 05/20/2017, 1:23 PM

## 2017-05-20 NOTE — Progress Notes (Signed)
[facility] Progress Note    ROUNDING NOTE S/p 5th ray 5/23 Continues with fevers/chills despite ATB's - chilling on HD Blood cultures pending from 5/24 PM  SEEN IN HD L AVF CANNULATED BP 222'L SYSTOLIC 2k BATH KEEPING VOLUME EVEN Pre weight 92 kg (EDW 92.5)   Objective Vitals:   05/19/17 1557 05/19/17 2018 05/20/17 0433 05/20/17 0813  BP: (!) 166/64 (!) 135/58 (!) 96/44 140/87  Pulse: 89 83 87 90  Resp: 18 17 18 18   Temp: (!) 100.8 F (38.2 C) (!) 102.3 F (39.1 C) (!) 101 F (38.3 C) 99.5 F (37.5 C)  TempSrc: Oral Oral Oral Oral  SpO2:  97% 100% 100%  Weight:      Height:       Physical Exam WNWD male NAD  Looks ill Shaking chills, marked tremor both hands  Regular S1S2 No S3 Lungs clear Abd soft and non tender No LE edema L foot wrapped LUE AVF cannulated   Recent Labs Lab 05/17/17 0805 05/18/17 0714 05/19/17 0413  NA 136 131* 134*  K 4.4 4.5 4.9  CL 93* 90* 94*  CO2 29 24 26   GLUCOSE 152* 156* 156*  BUN 33* 48* 33*  CREATININE 9.83* 11.94* 9.76*  CALCIUM 9.5 8.9 8.0*  PHOS  --  6.9* 4.9*    Recent Labs Lab 05/16/17 1422 05/17/17 0805 05/18/17 0714 05/19/17 0413 05/20/17 0815  WBC 11.2* 7.2 9.0 8.7 10.3  NEUTROABS 7.7  --   --   --   --   HGB 12.4* 12.9* 12.5* 12.3* 12.1*  HCT 37.8* 40.7 38.0* 39.1 37.3*  MCV 94.0 96.0 93.1 96.3 93.5  PLT 268 270 291 258 251    Recent Labs Lab 05/18/17 2116 05/19/17 0815 05/19/17 1244 05/19/17 1647 05/19/17 2152  GLUCAP 141* 157* 147* 131* 111*   Blood cultures: 5/24 pending  Recent Results (from the past 240 hour(s))  Aerobic/Anaerobic Culture (surgical/deep wound)     Status: Abnormal   Collection Time: 05/10/17 11:15 AM  Result Value Ref Range Status   Specimen Description FOOT DEEP TISSUE CULTURE, LEFT LATERAL FOOT  Final   Special Requests NONE  Final   Gram Stain   Final    FEW WBC PRESENT, PREDOMINANTLY PMN FEW GRAM POSITIVE COCCI FEW GRAM NEGATIVE RODS    Culture (A)  Final    MULTIPLE ORGANISMS PRESENT, NONE PREDOMINANT MODERATE BACTEROIDES SPECIES BETA LACTAMASE POSITIVE Performed at [facility], [address] [place], [place] [address]    Report Status 05/15/2017 FINAL  Final  Wound or Superficial Culture     Status: Abnormal   Collection Time: 05/16/17  2:26 PM  Result Value Ref Range Status   Specimen Description WOUND ULCER  Final   Special Requests NONE  Final   Gram Stain   Final    MODERATE WBC PRESENT, PREDOMINANTLY PMN FEW GRAM POSITIVE COCCI RARE GRAM NEGATIVE RODS    Culture MULTIPLE ORGANISMS PRESENT, NONE PREDOMINANT (A)  Final   Report Status 05/19/2017 FINAL  Final  MRSA PCR Screening     Status: None   Collection Time: 05/16/17 10:11 PM  Result Value Ref Range Status   MRSA by PCR NEGATIVE NEGATIVE Final    Comment:        The GeneXpert MRSA Assay (FDA approved for NASAL specimens only), is one component of a comprehensive MRSA colonization surveillance program. It is not intended to diagnose MRSA infection nor to guide or monitor treatment for MRSA infections.  Surgical pcr screen     Status: None   Collection Time: 05/17/17  9:57 PM  Result Value Ref Range Status   MRSA, PCR NEGATIVE NEGATIVE Final   Staphylococcus aureus NEGATIVE NEGATIVE Final    Comment:        The Xpert SA Assay (FDA approved for NASAL specimens in patients over 7 years of age), is one component of a comprehensive surveillance program.  Test performance has been validated by [facility] for patients greater than or equal to 97 year old. It is not intended to diagnose infection nor to guide or monitor treatment.    Medications: . vancomycin    . sodium chloride    . sodium chloride    . sodium chloride    . cefTAZidime (FORTAZ)  IV Stopped (05/18/17 2005)  . vancomycin Stopped (05/18/17 1102)   . doxercalciferol      . HYDROcodone-acetaminophen      . cinacalcet  120 mg Oral Q supper  . doxercalciferol   6 mcg Intravenous Q M,W,F-HD  . ferric citrate  210 mg Oral TID WC  . heparin  6,000 Units Dialysis Once in dialysis  . insulin aspart  0-5 Units Subcutaneous QHS  . insulin aspart  0-9 Units Subcutaneous TID WC  . multivitamin  1 tablet Oral QHS  . senna-docusate  1 tablet Oral BID   Dialysis Orders:  [facility] 4.25 hours 425/800 EDW 92.5 kg will be lower at discharge 2K2.25 Ca L AVF Hectorol 4 mcg Heparin 6000 units (sensipar 60 2/day at home; bider auryxia 2 tid w/meals)  Background:  66 y.o. year-old AAM w/ESRD, DM2, HTN, NICM with sCHF. Has had issues with foot ulcers with osteo of R foot earlier this year treated with V/F/hyperbaric O2. Current admission is for ulceration lateral aspect LEFT foot with associated chills, temp today to 102, foot pain/drainage/foul odor. Plain film with new lucency base of L 5th metatarsal c/w osteo. Admitted for IV ATB's. Had L 5th ray amputation 05/18/17.  Assessment/Plan: 1. Osteomyelitis L fifth metatarsal - MRI 5/22 - osteo no abscess. Janna Arch cx+ 5/21 Corynebacterium; s/p 5th ray amp 5/23  Vanco/fortaz ID following. Worrisome that pt still having shaking chills/fevers 2. ESRD - MWF  2K bath No heparin. Under EDW. BP soft. Keep even 3. Secondary HPT - hectorol with HD. Sensipar 120/day. [name]. 2ac 4. DM - insulin per primary team 5. Anemia 2/2 ESRD - Hgb 12.1 no current ESA requirement 6. HTN/Volume  - usual meds. Under EDW (pre wt today 92 kg) 7. PAD - has had prior evaluation for large vessel ds (Dr. Donnetta Hutching) and felt adequate flow to heal amputations   [name], MD [place] Pager 05/20/2017, 8:50 AM

## 2017-05-20 NOTE — Progress Notes (Addendum)
[facility]                                                                              Patient Demographics  [name], is a 66 y.o. male, DOB - [birth year], EAV:[id number]  Admit date - 05/16/2017   Admitting Physician [name] Dickens, MD  Outpatient Primary MD for the patient is [name] Cowman, MD  Outpatient specialists:   LOS - 4  days    Chief Complaint  Patient presents with  . Wound Check  . Fever       Brief summary   Patient is a 66 year old male with diabetes, ESRD, MWF, CAD, anemia, hypertension, systolic CHF presented to ED from ID clinic with worsening left foot pain and increasing drainage from a nonhealing foot ulcers. Per patient he had developed ulcers from the lateral aspect of his left foot after having his fourth toe amputated. For the last 3 months he had received vancomycin and cefazolin at dialysis and was recently transitioned to doxycycline in the ID clinic. In ED, he had a temp of 101.33F.   Assessment & Plan    Principal Problem:   Osteomyelitis of left foot (HCC) With nonhealing left foot ulcers possibly due to diabetic foot ulcers and underlying PAD - Patient has a history of osteomyelitis in his right foot. Patient received antibiotics via hemodialysis for several weeks prior to admission and was recently transitioned to oral doxycycline. - MRI of the left foot showed soft tissue ulcer overlying the base of the fifth metatarsal osteomyelitis of the base of the fifth metatarsal along the lateral margin, no abscess. - Patient underwent left fifth ray amputation and excision on 5/23, postop day # 2  - Currently on IV vancomycin and Fortaz, still spiking fevers, repeat blood cultures done on 5/24, currently negative - ID recommending 4 weeks of antibiotics, follow recommendations regarding disposition - Discussed with ID, Dr. 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 - H&H currently stable     Benign essential HTN - BP still borderline hypotensive     ESRD on dialysis [name]) - Patient on hemodialysis MWF, nephrology consulted    Peripheral arterial disease ([name])  - vascular surgery following     Diabetes mellitus with complication ([name]) - NWG9F 6.7, cont sliding scale insulin    Code Status: Full CODE STATUS  DVT Prophylaxis:Heparin  Family  Communication: Discussed in detail with the patient, all imaging results, lab results explained to the patient  Disposition Plan:   Time Spent in minutes   25 minutes  Procedures:  Hemodialysis   Consultants:   Orthopedics   infectious disease Nephrology  Antimicrobials:   IV vancomycin with hemodialysis  IV Fortaz with hemodialysis    Medications  Scheduled Meds: . cinacalcet  120 mg Oral Q supper  . doxercalciferol  6 mcg Intravenous Q M,W,F-HD  . ferric citrate  210 mg Oral TID WC  . heparin  6,000 Units Dialysis Once in dialysis  . insulin aspart  0-5 Units Subcutaneous QHS  .  insulin aspart  0-9 Units Subcutaneous TID WC  . multivitamin  1 tablet Oral QHS  . senna-docusate  1 tablet Oral BID   Continuous Infusions: . sodium chloride    . sodium chloride    . sodium chloride    . cefTAZidime (FORTAZ)  IV Stopped (05/18/17 2005)  . vancomycin 1,000 mg (05/20/17 1128)   PRN Meds:.sodium chloride, sodium chloride, acetaminophen **OR** acetaminophen, heparin, HYDROcodone-acetaminophen, lidocaine (PF), lidocaine-prilocaine, ondansetron **OR** ondansetron (ZOFRAN) IV, pentafluoroprop-tetrafluoroeth, polyethylene glycol   Antibiotics   Anti-infectives    Start     Dose/Rate Route Frequency Ordered Stop   05/18/17 0600  vancomycin (VANCOCIN) IVPB 1000 mg/200 mL premix  Status:  Discontinued     1,000 mg 200 mL/hr over 60 Minutes Intravenous On call to O.R. 05/17/17 2338 05/18/17 1113   05/16/17  2200  vancomycin (VANCOCIN) IVPB 1000 mg/200 mL premix     1,000 mg 200 mL/hr over 60 Minutes Intravenous  Once 05/16/17 2002 05/16/17 2214   05/16/17 2100  vancomycin (VANCOCIN) 2,000 mg in sodium chloride 0.9 % 500 mL IVPB  Status:  Discontinued     2,000 mg 250 mL/hr over 120 Minutes Intravenous  Once 05/16/17 1809 05/16/17 2000   05/16/17 2100  vancomycin (VANCOCIN) IVPB 1000 mg/200 mL premix     1,000 mg 200 mL/hr over 60 Minutes Intravenous  Once 05/16/17 2000 05/16/17 2030   05/16/17 2000  cefTAZidime (FORTAZ) 2 g in dextrose 5 % 50 mL IVPB     2 g 100 mL/hr over 30 Minutes Intravenous Every M-W-F (2000) 05/16/17 1610     05/16/17 2000  vancomycin (VANCOCIN) IVPB 1000 mg/200 mL premix  Status:  Discontinued     1,000 mg 200 mL/hr over 60 Minutes Intravenous  Once 05/16/17 1633 05/16/17 1809   05/16/17 1615  vancomycin (VANCOCIN) IVPB 1000 mg/200 mL premix     1,000 mg 200 mL/hr over 60 Minutes Intravenous Every M-W-F (Hemodialysis) 05/16/17 1603     05/16/17 1600  vancomycin (VANCOCIN) 2,000 mg in sodium chloride 0.9 % 500 mL IVPB  Status:  Discontinued     2,000 mg 250 mL/hr over 120 Minutes Intravenous  Once 05/16/17 1603 05/16/17 1609        Subjective:   [name] was seen and examined today. BP low, pain is currently controlled however does not feel too [name], still having chills.  Patient denies chest pain, shortness of breath, abdominal pain, N/V, new weakness, numbess, tingling.  Objective:   Vitals:   05/20/17 0818 05/20/17 0830 05/20/17 0900 05/20/17 0930  BP: (!) 125/50 (!) 120/51 (!) 113/49 (!) 84/42  Pulse: 88 88 84 80  Resp:      Temp:    (P) 99 F (37.2 C)  TempSrc:    (P) Oral  SpO2:      Weight:      Height:        Intake/Output Summary (Last 24 hours) at 05/20/17 1246 Last data filed at 05/20/17 0734  Gross per 24 hour  Intake              600 ml  Output                0 ml  Net              600 ml     Wt Readings from Last 3  Encounters:  05/20/17 90 kg (198 lb 6.6 oz)  05/16/17 96.2 kg (212 lb)  03/07/17 95.7  kg (211 lb)     Exam  General: Alert and oriented 3 NAD  HEENT:   Neck: No JVD   Cardiovascular: S1-S2 clear, RRR  Respiratory: Clear to auscultation bilaterally  Gastrointestinal: Soft, nontender, nondistended, normal bowel sounds  Ext: left foot dressing intact, drain  neuro: No new deficits  Skin:  Left foot dressing intact  psyc: Normal affect and demeanor, alert and oriented x3    Data Reviewed:  I have personally reviewed following labs and imaging studies  Micro Results Recent Results (from the past 240 hour(s))  Wound or Superficial Culture     Status: Abnormal   Collection Time: 05/16/17  2:26 PM  Result Value Ref Range Status   Specimen Description WOUND ULCER  Final   Special Requests NONE  Final   Gram Stain   Final    MODERATE WBC PRESENT, PREDOMINANTLY PMN FEW GRAM POSITIVE COCCI RARE GRAM NEGATIVE RODS    Culture MULTIPLE ORGANISMS PRESENT, NONE PREDOMINANT (A)  Final   Report Status 05/19/2017 FINAL  Final  MRSA PCR Screening     Status: None   Collection Time: 05/16/17 10:11 PM  Result Value Ref Range Status   MRSA by PCR NEGATIVE NEGATIVE Final    Comment:        The GeneXpert MRSA Assay (FDA approved for NASAL specimens only), is one component of a comprehensive MRSA colonization surveillance program. It is not intended to diagnose MRSA infection nor to guide or monitor treatment for MRSA infections.   Surgical pcr screen     Status: None   Collection Time: 05/17/17  9:57 PM  Result Value Ref Range Status   MRSA, PCR NEGATIVE NEGATIVE Final   Staphylococcus aureus NEGATIVE NEGATIVE Final    Comment:        The Xpert SA Assay (FDA approved for NASAL specimens in patients over 74 years of age), is one component of a comprehensive surveillance program.  Test performance has been validated by [facility] for patients greater than or equal to  79 year old. It is not intended to diagnose infection nor to guide or monitor treatment.     Radiology Reports Mr Foot Left Wo Contrast  Result Date: 05/17/2017 CLINICAL DATA:  Nonhealing ulcer involving the left foot. EXAM: MRI OF THE LEFT FOOT WITHOUT CONTRAST TECHNIQUE: Multiplanar, multisequence MR imaging of the left forefoot was performed. No intravenous contrast was administered. COMPARISON:  None. FINDINGS: Bones/Joint/Cartilage Soft tissue ulcer overlying the base of the fifth metatarsal. Ulcer extends to the cortex of the base of the fifth metatarsal along the lateral margin with cortical irregularity, underlying bone marrow edema and T1 hypointensity most consistent with osteomyelitis. Amputation of the fourth phalanx. No acute fracture or dislocation. Normal alignment. No joint effusion. Mild osteoarthritis of the first MTP joint. Mild osteoarthritis of the second MTP joint. Mild osteoarthritis of the navicular- medial cuneiform joint. Ligaments Collateral ligaments are intact.  Lisfranc ligament is intact. Muscles and Tendons Flexor, peroneal and extensor compartment tendons are intact. Generalized muscle atrophy. Soft tissue No fluid collection or hematoma. No soft tissue mass. Soft tissue edema along the lateral aspect of the foot overlying the fifth metatarsal consistent with cellulitis. IMPRESSION: 1. Soft tissue ulcer overlying the base of the fifth metatarsal. 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 No drainable fluid collection to suggest an abscess. Electronically Signed   By: Kathreen Devoid   On: 05/17/2017 08:15   Dg Chest Port 1 [address]  Result Date: 05/18/2017 CLINICAL DATA:  Shortness of Breath EXAM: PORTABLE CHEST 1 VIEW COMPARISON:  09/11/2016 FINDINGS: Cardiac shadow is mildly enlarged. The lungs are well aerated bilaterally. No focal infiltrate or sizable effusion is noted. No bony abnormality is noted IMPRESSION: No active disease.  Electronically Signed   By: Inez Catalina M.D.   On: 05/18/2017 07:58   Dg Foot Complete Left  Result Date: 05/16/2017 CLINICAL DATA:  Left foot soft tissue wound with drainage and pain and fevers, initial encounter EXAM: LEFT FOOT - COMPLETE 3+ VIEW COMPARISON:  05/03/2017 FINDINGS: Degenerative changes at first MTP joint are noted. Soft tissue defect is again noted at the distal aspect of the fifth toe as well as laterally in the foot. There are some erosive changes identified at the base of the fifth metatarsal which are new from the prior exam consistent with osteomyelitis. Chronic changes in the heads of the second and fourth metatarsals are seen. Changes of prior fourth toe amputation are again noted. IMPRESSION: New area of lucency at the base of the fifth metatarsal. This is consistent with underlying osteomyelitis. Electronically Signed   By: Inez Catalina M.D.   On: 05/16/2017 14:14   Dg Foot Complete Left  Result Date: 05/03/2017 CLINICAL DATA:  66 year old male with chronic left foot wounds EXAM: LEFT FOOT - COMPLETE 3+ VIEW COMPARISON:  Prior radiographs of the left foot 01/04/2017 FINDINGS: Stable postsurgical changes of prior fourth digit amputation. Sequelae of prior Freiberg's infraction at the head of the second metatarsal are similar compared to prior. No evidence of new bony refraction or due destruction to suggest acute osteomyelitis. Soft tissue irregularity present at the tip of the fifth toe, and laterally adjacent to the base of the fifth metatarsal consistent with ulcerations. Calcifications are present of the digital arteries bilaterally. Degenerative osteoarthritis present in the great toe MTP joint. IMPRESSION: 1. No convincing conventional radiographic evidence of active osteomyelitis. 2. Active soft tissue ulceration at the tip of the fifth digit, and at the lateral aspect of the foot adjacent the base of the fifth metatarsal. 3. Surgical changes of prior fourth toe amputation. 4.  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 5. Small vessel atherosclerotic vascular calcifications. 6. 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 Electronically Signed   By: Jacqulynn Cadet M.D.   On: 05/03/2017 14:23    Lab Data:  CBC:  Recent Labs Lab 05/16/17 1422 05/17/17 0805 05/18/17 0714 05/19/17 0413 05/20/17 0815  WBC 11.2* 7.2 9.0 8.7 10.3  NEUTROABS 7.7  --   --   --   --   HGB 12.4* 12.9* 12.5* 12.3* 12.1*  HCT 37.8* 40.7 38.0* 39.1 37.3*  MCV 94.0 96.0 93.1 96.3 93.5  PLT 268 270 291 258 263   Basic Metabolic Panel:  Recent Labs Lab 05/16/17 1422 05/17/17 0805 05/18/17 0714 05/19/17 0413 05/20/17 0815  NA 135 136 131* 134* 132*  K 5.0 4.4 4.5 4.9 4.4  CL 95* 93* 90* 94* 95*  CO2 25 29 24 26  21*  GLUCOSE 142* 152* 156* 156* 113*  BUN 67* 33* 48* 33* 51*  CREATININE 13.82* 9.83* 11.94* 9.76* 13.21*  CALCIUM 9.3 9.5 8.9 8.0* 7.9*  PHOS  --   --  6.9* 4.9* 5.5*   GFR: Estimated Creatinine Clearance: 6.3 mL/min (A) (by C-G formula based on SCr of 13.21 mg/dL (H)). Liver Function Tests:  Recent Labs Lab 05/17/17 0805 05/18/17  8841 05/19/17 0413 05/20/17 0815  AST 13*  --   --   --   ALT 9*  --   --   --   ALKPHOS 71  --   --   --   BILITOT 0.7  --   --   --   PROT 8.0  --   --   --   ALBUMIN 3.2* 2.9* 2.9* 2.8*   No results for input(s): LIPASE, AMYLASE in the last 168 hours. No results for input(s): AMMONIA in the last 168 hours. Coagulation Profile:  Recent Labs Lab 05/17/17 0805  INR 1.19   Cardiac Enzymes: No results for input(s): CKTOTAL, CKMB, CKMBINDEX, TROPONINI in the last 168 hours. BNP (last 3 results) No results for input(s): PROBNP in the last 8760 hours. HbA1C: No results for input(s): HGBA1C in the last 72 hours. CBG:  Recent Labs Lab 05/18/17 2116 05/19/17 0815 05/19/17 1244 05/19/17 1647 05/19/17 2152  GLUCAP 141* 157* 147*  131* 111*   Lipid Profile: No results for input(s): CHOL, HDL, LDLCALC, TRIG, CHOLHDL, LDLDIRECT in the last 72 hours. Thyroid Function Tests: No results for input(s): TSH, T4TOTAL, FREET4, T3FREE, THYROIDAB in the last 72 hours. Anemia Panel: No results for input(s): VITAMINB12, FOLATE, FERRITIN, TIBC, IRON, RETICCTPCT in the last 72 hours. Urine analysis:    Component Value Date/Time   COLORURINE YELLOW 09/11/2016 1933   APPEARANCEUR CLEAR 09/11/2016 1933   LABSPEC 1.015 09/11/2016 1933   PHURINE 8.5 (H) 09/11/2016 1933   GLUCOSEU 100 (A) 09/11/2016 1933   HGBUR NEGATIVE 09/11/2016 1933   BILIRUBINUR SMALL (A) 09/11/2016 1933   KETONESUR NEGATIVE 09/11/2016 1933   PROTEINUR 100 (A) 09/11/2016 1933   UROBILINOGEN 0.2 11/22/2014 1740   NITRITE NEGATIVE 09/11/2016 1933   LEUKOCYTESUR NEGATIVE 09/11/2016 1933     Jenesys Casseus M.D. [facility] 05/20/2017, 12:46 PM  Pager: [phone] Between 7am to 7pm - call Pager - [phone]  After 7pm go to [email or web address] - password [email or web address]  Call night coverage person covering after 7pm

## 2017-05-20 NOTE — Progress Notes (Signed)
Pts temp 102.6 oral, 650 tylenol given, MD updated via text page

## 2017-05-20 NOTE — Progress Notes (Signed)
PT Cancellation Note  Patient Details Name: [name] STREEPER Sr. MRN: [id number] DOB: 1951/03/12   Cancelled Treatment:    Reason Eval/Treat Not Completed: Pain limiting ability to participate (pt wants to wait to do PT after next pain medication dose, which is due at 2: 25. Will check back. )   Philomena Doheny 05/20/2017, 1:46 PM [phone]

## 2017-05-20 NOTE — Progress Notes (Signed)
Pharmacy Antibiotic Note  [name]. is a 66 y.o. male admitted on 05/16/2017 with osteomyelitis.  Pharmacy has been consulted for vancomycin and meropenem dosing. Previously on ceftazidime but still spiking fevers - tmax 102.3. Patient has ESRD on HD-MWF. He is s/p 5th ray amputation on 5/23.   Plan: D/c ceftazidime Vancomycin 1g IV qHD-MWF Start Meropenem 500mg  IV q24h at 1800 F/u HD schedule/tolerability, repeat BCx, pre-HD VR as indicated ID recommending treating x 4wks  Height: 6\' 1"  (185.4 cm) Weight: 198 lb 6.6 oz (90 kg) IBW/kg (Calculated) : 79.9  Temp (24hrs), Avg:100.5 F (38.1 C), Min:99 F (37.2 C), Max:102.3 F (39.1 C)   Recent Labs Lab 05/16/17 1319 05/16/17 1422 05/16/17 1550 05/17/17 0805 05/18/17 0714 05/19/17 0413 05/20/17 0815  WBC  --  11.2*  --  7.2 9.0 8.7 10.3  CREATININE  --  13.82*  --  9.83* 11.94* 9.76* 13.21*  LATICACIDVEN 1.47  --  0.59  --   --   --   --     Estimated Creatinine Clearance: 6.3 mL/min (A) (by C-G formula based on SCr of 13.21 mg/dL (H)).    Allergies  Allergen Reactions  . Penicillins Hives and Swelling    Ankle swelling  PATIENT HAD A PCN REACTION WITH IMMEDIATE RASH, FACIAL/TONGUE/THROAT SWELLING, SOB, OR LIGHTHEADEDNESS WITH HYPOTENSION:  #  #  #  YES  #  #  #   Has patient had a PCN reaction causing severe rash involving mucus membranes or skin necrosis: no Has patient had a PCN reaction that required hospitalization: no Has patient had a PCN reaction occurring within the last 10 years: no If all of the above answers are "NO", then may proceed with Cephalosporin use.    Antimicrobials this admission: 5/21 vanc>> 5/21 ceftazidime>>5/25 5/25 meropenem>>  Microbiology results: 5/15 WCx: multiple organisms 5/21 WCx: mod diphtheroids 5/23 Tissue Cx: ?pending 5/25 BCx:   Elicia Lamp, PharmD, BCPS Clinical Pharmacist Rx Phone # for today: [phone] After 3:30PM, please call Main Rx: #[id number] 05/20/2017 1:27  PM

## 2017-05-20 NOTE — Progress Notes (Signed)
Physical Therapy Treatment Patient Details Name: [name] PRUDEN Sr. MRN: [id number] DOB: December 28, 1950 Today's Date: 05/20/2017    History of Present Illness [name] DRONE Sr. is a pleasant 66 y.o. male with PMH significant for diabetes type 2 end-stage renal disease M-W-F dialysis schedule, CAD, anemia, hypertension, systolic heart failure presents to the emergency department from infectious disease clinic with the chief complaint of worsening left foot pain/increased drainage from a nonhealing foot ulcers. Initial evaluation concerning for osteomyelitis. Pt s/p 5th ray amputation and partial excision of cuboid (05/18/17)    PT Comments    Stair training with pt and wife completed. He is ready to DC home from PT standpoint.   Follow Up Recommendations  Home health PT;Supervision for mobility/OOB     Equipment Recommendations  Rolling walker with 5" wheels;3in1 (PT)    Recommendations for Other Services       Precautions / Restrictions Precautions Precautions: Fall Required Braces or Orthoses: Other Brace/Splint Other Brace/Splint: post op shoe, WB on heel Restrictions Weight Bearing Restrictions: Yes LLE Weight Bearing: Weight bearing as tolerated Other Position/Activity Restrictions: WB on heel    Mobility  Bed Mobility Overal bed mobility: Modified Independent                Transfers Overall transfer level: Needs assistance Equipment used: Rolling walker (2 wheeled) Transfers: Sit to/from [facility] Sit to Stand: Supervision Stand pivot transfers: Min assist       General transfer comment: VCs hand placement, min A for balance for pivot  Ambulation/Gait             General Gait Details: pt walked to bathroom with RW earlier today, gait deferred so pt would have energy for stair training as he is fatigued from HD this morning   Stairs Stairs: Yes   Stair Management: No rails;Backwards;Step to pattern;With walker Number of Stairs:  2 General stair comments: wife present and assisted in stabilizing RW; pt/wife demonstrated good safety and technique, good adherence to sequencing  Wheelchair Mobility    Modified Rankin (Stroke Patients Only)       Balance Overall balance assessment: Needs assistance   Sitting balance-Leahy Scale: Good       Standing balance-Leahy Scale: Fair                              Cognition Arousal/Alertness: Awake/alert Behavior During Therapy: WFL for tasks assessed/performed Overall Cognitive Status: Within Functional Limits for tasks assessed                                 General Comments: Pt asking good questions regarding medical condition      Exercises      General Comments        Pertinent Vitals/Pain Pain Score: 3  Pain Location: L foot lateral side and behind ankle Pain Descriptors / Indicators: Aching Pain Intervention(s): Limited activity within patient's tolerance;Monitored during session;Premedicated before session;Repositioned    Home Living                      Prior Function            PT Goals (current goals can now be found in the care plan section) Acute Rehab PT Goals Patient Stated Goal: to return home; likes to dance PT Goal Formulation: With patient/family Time For Goal Achievement: 06/02/17  Potential to Achieve Goals: Good Progress towards PT goals: Progressing toward goals    Frequency    Min 3X/week      PT Plan Current plan remains appropriate    Co-evaluation              AM-PAC PT "6 Clicks" Daily Activity  Outcome Measure  Difficulty turning over in bed (including adjusting bedclothes, sheets and blankets)?: None Difficulty moving from lying on back to sitting on the side of the bed? : None Difficulty sitting down on and standing up from a chair with arms (e.g., wheelchair, bedside commode, etc,.)?: A Little Help needed moving to and from a bed to chair (including a  wheelchair)?: A Little Help needed walking in hospital room?: A Little Help needed climbing 3-5 steps with a railing? : A Little 6 Click Score: 20    End of Session Equipment Utilized During Treatment: Gait belt;Other (comment) (post op shoe) Activity Tolerance: Patient limited by fatigue Patient left: in chair;with call bell/phone within reach;with family/visitor present Nurse Communication: Mobility status PT Visit Diagnosis: Other abnormalities of gait and mobility (R26.89);Difficulty in walking, not elsewhere classified (R26.2);Pain Pain - Right/Left: Left Pain - part of body: Ankle and joints of foot     Time: 1031-5945 PT Time Calculation (min) (ACUTE ONLY): 31 min  Charges:  $Therapeutic Activity: 23-37 mins                    G Codes:          Philomena Doheny 05/20/2017, 3:26 PM [phone]

## 2017-05-20 NOTE — Progress Notes (Signed)
Dr. Tana Coast paged about patient having temp of 102.6 F.

## 2017-05-20 NOTE — Progress Notes (Addendum)
    [facility] for Infectious Disease    Date of Admission:  05/16/2017   Total days of antibiotics 4        Day 5 ceftaz/vanco           ID: [name] Sages Sr. is a 66 y.o. male with ESRD on HD and PAD with osteomyelitis of left foot s/p from 5th ray amputation and partial excision of the cuboid Principal Problem:   Osteomyelitis of left foot ([place]) Active Problems:   Anemia due to end stage renal disease (HCC)   Cardiomegaly   Benign essential HTN   Congestive dilated cardiomyopathy (HCC)   ESRD on dialysis ([place])   Peripheral arterial disease ([place])   ESRD on hemodialysis ([place])   Diabetes mellitus with complication (HCC)    Subjective: Having fevers, chills, feeling poorly with temp to 102.51F. He was seen by ortho who felt that his amputation site was healing as expected. Patient had 2 episdoes of loose stool which he thinks is due to cinacalcet.  Medications:  . cinacalcet  120 mg Oral Q supper  . doxercalciferol  6 mcg Intravenous Q M,W,F-HD  . ferric citrate  210 mg Oral TID WC  . insulin aspart  0-5 Units Subcutaneous QHS  . insulin aspart  0-9 Units Subcutaneous TID WC  . multivitamin  1 tablet Oral QHS  . senna-docusate  1 tablet Oral BID    Objective: Vital signs in last 24 hours: Temp:  [98.5 F (36.9 C)-102.6 F (39.2 C)] 98.5 F (36.9 C) (05/25 2108) Pulse Rate:  [79-92] 79 (05/25 2108) Resp:  [16-18] 16 (05/25 2108) BP: (84-149)/(42-87) 97/46 (05/25 2108) SpO2:  [94 %-100 %] 98 % (05/25 2108) Weight:  [198 lb 6.6 oz (90 kg)-202 lb 9.6 oz (91.9 kg)] 202 lb 9.6 oz (91.9 kg) (05/25 2108)  gen = a xo by 3 bu fatigue appearing heent = op clear, MMM, no signs of thrush Neck = supple Cors = nl s1,s2 no g/m/r pulm = no tachypnea, no acc m use. No w/c/r abd = decrease bowel sounds Ext = left leg wrapped    Lab Results  Recent Labs  05/19/17 0413 05/20/17 0815  WBC 8.7 10.3  HGB 12.3* 12.1*  HCT 39.1 37.3*  NA 134* 132*  K 4.9 4.4  CL 94* 95*    CO2 26 21*  BUN 33* 51*  CREATININE 9.76* 13.21*   Liver Panel  Recent Labs  05/19/17 0413 05/20/17 0815  ALBUMIN 2.9* 2.8*    Microbiology: 5/21 wound cx -multiple organism Studies/Results: No results found.   Assessment/Plan: 66yo M originally admitted for fevers, worsening left foot ulcer with osteo s/p amputation but now still having ongoing fevers, concern for FUO vs. Unidentified source of infection vs. Drug fever  Osteomyelitis of left foot s/p 5th ray amputation and partial cuboid amputation = continue on vanco  since may have some residual affected bone (cuboid) involvement. Would recommend 4 wk IV therapy. We will change ceftaz to meropenem  Fever = repeated blood cx yesterday which are no growth to date at 24hr. Recommend cxr, and left foot xray to see if any other area of involvement. Will check cbc with diff to see if having any eosinophilia.  Dr Lucianne Lei dam to see him over the weekend.    Baxter Flattery [facility] for Infectious Diseases Cell: [phone] Pager: [phone]  05/20/2017, 9:20 PM

## 2017-05-21 ENCOUNTER — Inpatient Hospital Stay (HOSPITAL_COMMUNITY): Payer: Medicare Other

## 2017-05-21 LAB — GLUCOSE, CAPILLARY
GLUCOSE-CAPILLARY: 115 mg/dL — AB (ref 65–99)
GLUCOSE-CAPILLARY: 146 mg/dL — AB (ref 65–99)
Glucose-Capillary: 132 mg/dL — ABNORMAL HIGH (ref 65–99)
Glucose-Capillary: 162 mg/dL — ABNORMAL HIGH (ref 65–99)

## 2017-05-21 LAB — HEPATITIS B SURFACE ANTIGEN: HEP B S AG: NEGATIVE

## 2017-05-21 NOTE — Progress Notes (Signed)
Subjective: No new complaints   Antibiotics:  Anti-infectives    Start     Dose/Rate Route Frequency Ordered Stop   05/21/17 1800  meropenem (MERREM) 500 mg in sodium chloride 0.9 % 50 mL IVPB     500 mg 100 mL/hr over 30 Minutes Intravenous Every 24 hours 05/20/17 1334     05/20/17 1400  meropenem (MERREM) 500 mg in sodium chloride 0.9 % 50 mL IVPB  Status:  Discontinued     500 mg 100 mL/hr over 30 Minutes Intravenous Every 24 hours 05/20/17 1332 05/20/17 1334   05/20/17 1345  meropenem (MERREM) 500 mg in sodium chloride 0.9 % 50 mL IVPB     500 mg 100 mL/hr over 30 Minutes Intravenous  Once 05/20/17 1334 05/20/17 1449   05/18/17 0600  vancomycin (VANCOCIN) IVPB 1000 mg/200 mL premix  Status:  Discontinued     1,000 mg 200 mL/hr over 60 Minutes Intravenous On call to O.R. 05/17/17 2338 05/18/17 1113   05/16/17 2200  vancomycin (VANCOCIN) IVPB 1000 mg/200 mL premix     1,000 mg 200 mL/hr over 60 Minutes Intravenous  Once 05/16/17 2002 05/16/17 2214   05/16/17 2100  vancomycin (VANCOCIN) 2,000 mg in sodium chloride 0.9 % 500 mL IVPB  Status:  Discontinued     2,000 mg 250 mL/hr over 120 Minutes Intravenous  Once 05/16/17 1809 05/16/17 2000   05/16/17 2100  vancomycin (VANCOCIN) IVPB 1000 mg/200 mL premix     1,000 mg 200 mL/hr over 60 Minutes Intravenous  Once 05/16/17 2000 05/16/17 2030   05/16/17 2000  cefTAZidime (FORTAZ) 2 g in dextrose 5 % 50 mL IVPB  Status:  Discontinued     2 g 100 mL/hr over 30 Minutes Intravenous Every M-W-F (2000) 05/16/17 1610 05/20/17 1322   05/16/17 2000  vancomycin (VANCOCIN) IVPB 1000 mg/200 mL premix  Status:  Discontinued     1,000 mg 200 mL/hr over 60 Minutes Intravenous  Once 05/16/17 1633 05/16/17 1809   05/16/17 1615  vancomycin (VANCOCIN) IVPB 1000 mg/200 mL premix     1,000 mg 200 mL/hr over 60 Minutes Intravenous Every M-W-F (Hemodialysis) 05/16/17 1603     05/16/17 1600  vancomycin (VANCOCIN) 2,000 mg in sodium chloride 0.9  % 500 mL IVPB  Status:  Discontinued     2,000 mg 250 mL/hr over 120 Minutes Intravenous  Once 05/16/17 1603 05/16/17 1609      Medications: Scheduled Meds: . cinacalcet  120 mg Oral Q supper  . doxercalciferol  6 mcg Intravenous Q M,W,F-HD  . ferric citrate  210 mg Oral TID WC  . insulin aspart  0-5 Units Subcutaneous QHS  . insulin aspart  0-9 Units Subcutaneous TID WC  . multivitamin  1 tablet Oral QHS  . senna-docusate  1 tablet Oral BID   Continuous Infusions: . sodium chloride    . meropenem (MERREM) IV    . vancomycin Stopped (05/20/17 1228)   PRN Meds:.acetaminophen **OR** acetaminophen, HYDROcodone-acetaminophen, ondansetron **OR** ondansetron (ZOFRAN) IV, polyethylene glycol    Objective: Weight change:   Intake/Output Summary (Last 24 hours) at 05/21/17 1051 Last data filed at 05/21/17 1017  Gross per 24 hour  Intake              950 ml  Output               51 ml  Net  899 ml   Blood pressure (!) 113/54, pulse 81, temperature (!) 100.7 F (38.2 C), temperature source Oral, resp. rate 17, height 6\' 1"  (1.854 m), weight 202 lb 9.6 oz (91.9 kg), SpO2 95 %. Temp:  [98.5 F (36.9 C)-102.6 F (39.2 C)] 100.7 F (38.2 C) (05/26 1016) Pulse Rate:  [74-89] 81 (05/26 1016) Resp:  [16-17] 17 (05/26 1016) BP: (97-149)/(46-76) 113/54 (05/26 1016) SpO2:  [94 %-98 %] 95 % (05/26 1016) Weight:  [202 lb 9.6 oz (91.9 kg)] 202 lb 9.6 oz (91.9 kg) (05/25 2108)  Physical Exam: General: Alert and awake, oriented x3, not in any acute distress. HEENT: anicteric sclera, pupils reactive to light and accommodation, EOMI CVS regular rate, normal r,  no murmur rubs or gallops Chest: clear to auscultation bilaterally, no wheezing, rales or rhonchi Abdomen: soft nontender, nondistended, normal bowel sounds, Extremities left foot wrapped Neuro: nonfocal  CBC:  CBC Latest Ref Rng & Units 05/20/2017 05/19/2017 05/18/2017  WBC 4.0 - 10.5 K/uL 10.3 8.7 9.0  Hemoglobin  13.0 - 17.0 g/dL 12.1(L) 12.3(L) 12.5(L)  Hematocrit 39.0 - 52.0 % 37.3(L) 39.1 38.0(L)  Platelets 150 - 400 K/uL 251 258 291      BMET  Recent Labs  05/19/17 0413 05/20/17 0815  NA 134* 132*  K 4.9 4.4  CL 94* 95*  CO2 26 21*  GLUCOSE 156* 113*  BUN 33* 51*  CREATININE 9.76* 13.21*  CALCIUM 8.0* 7.9*     Liver Panel   Recent Labs  05/19/17 0413 05/20/17 0815  ALBUMIN 2.9* 2.8*       Sedimentation Rate No results for input(s): ESRSEDRATE in the last 72 hours. C-Reactive Protein No results for input(s): CRP in the last 72 hours.  Micro Results: Recent Results (from the past 720 hour(s))  Aerobic/Anaerobic Culture (surgical/deep wound)     Status: Abnormal   Collection Time: 05/10/17 11:15 AM  Result Value Ref Range Status   Specimen Description FOOT DEEP TISSUE CULTURE, LEFT LATERAL FOOT  Final   Special Requests NONE  Final   Gram Stain   Final    FEW WBC PRESENT, PREDOMINANTLY PMN FEW GRAM POSITIVE COCCI FEW GRAM NEGATIVE RODS    Culture (A)  Final    MULTIPLE ORGANISMS PRESENT, NONE PREDOMINANT MODERATE BACTEROIDES SPECIES BETA LACTAMASE POSITIVE Performed at [facility], [place] [address], [place], [place] [address]    Report Status 05/15/2017 FINAL  Final  Wound or Superficial Culture     Status: Abnormal   Collection Time: 05/16/17  2:26 PM  Result Value Ref Range Status   Specimen Description WOUND ULCER  Final   Special Requests NONE  Final   Gram Stain   Final    MODERATE WBC PRESENT, PREDOMINANTLY PMN FEW GRAM POSITIVE COCCI RARE GRAM NEGATIVE RODS    Culture MULTIPLE ORGANISMS PRESENT, NONE PREDOMINANT (A)  Final   Report Status 05/19/2017 FINAL  Final  MRSA PCR Screening     Status: None   Collection Time: 05/16/17 10:11 PM  Result Value Ref Range Status   MRSA by PCR NEGATIVE NEGATIVE Final    Comment:        The GeneXpert MRSA Assay (FDA approved for NASAL specimens only), is one component of a comprehensive MRSA  colonization surveillance program. It is not intended to diagnose MRSA infection nor to guide or monitor treatment for MRSA infections.   Surgical pcr screen     Status: None   Collection Time: 05/17/17  9:57 PM  Result Value Ref Range Status   MRSA, PCR NEGATIVE NEGATIVE Final   Staphylococcus aureus NEGATIVE NEGATIVE Final    Comment:        The Xpert SA Assay (FDA approved for NASAL specimens in patients over 10 years of age), is one component of a comprehensive surveillance program.  Test performance has been validated by [facility] for patients greater than or equal to 71 year old. It is not intended to diagnose infection nor to guide or monitor treatment.     Studies/Results: No results found.    Assessment/Plan:  INTERVAL HISTORY:  Fever curve a better but not afebrile in 24 hours yet   Principal Problem:   Osteomyelitis of left foot (HCC) Active Problems:   Anemia due to end stage renal disease (HCC)   Cardiomegaly   Benign essential HTN   Congestive dilated cardiomyopathy (HCC)   ESRD on dialysis ([place])   Peripheral arterial disease ([place])   ESRD on hemodialysis ([place])   Diabetes mellitus with complication ([place])    [name] Sages Sr. is a 66 y.o. male with  fevers, worsening left foot ulcer with osteo s/p amputation but now still having ongoing fevers, concern for FUO  #1 FUO: If his fevers do not defervesce he needs reimaging of his foot to ensure there is not an undrained abscess or residual osteomyelitis that requires further surgery. I called microbiology lab and nothing is growing from his blood cultures yet  #2 osteomyelitis involving left foot status post fifth ray amputation and partial cuboid amputation continue vancomycin, and he is currently also on meropenem though the latter is not going to be convenient for dialysis dosing. Again with fevers I worry that he has undrained infection that needs surgical attention.       LOS: 5 days    [name] 05/21/2017, 10:51 AM

## 2017-05-21 NOTE — Progress Notes (Signed)
[facility] Progress Note   Subjective:   Feeling better this am, but wonders where the fever is from.  Said ortho undressed left foot yesterday - and it is to be undressed again in a week.  Said dialysis is better "in a bed" Says sensipar is making him sick every day Continues to spike fevers and ATB's changed to vanco/meropenem   Objective Vitals:   05/20/17 1708 05/20/17 1901 05/20/17 2108 05/21/17 0433  BP: 134/64  (!) 97/46 (!) 126/55  Pulse: 82  79 74  Resp: 16  16 16   Temp: (!) 102.6 F (39.2 C) (!) 100.4 F (38 C) 98.5 F (36.9 C) 99.7 F (37.6 C)  TempSrc: Oral Oral Oral Oral  SpO2: 94%  98% 97%  Weight:   91.9 kg (202 lb 9.6 oz)   Height:       Physical Exam General: NAD today Heart: RRR Lungs: no rales Abdomen: soft NT Extremities: left foot wrapped; right 3rd toe dark Dialysis Access: left AVF + bruit  Recent Labs Lab 05/18/17 0714 05/19/17 0413 05/20/17 0815  NA 131* 134* 132*  K 4.5 4.9 4.4  CL 90* 94* 95*  CO2 24 26 21*  GLUCOSE 156* 156* 113*  BUN 48* 33* 51*  CREATININE 11.94* 9.76* 13.21*  CALCIUM 8.9 8.0* 7.9*  PHOS 6.9* 4.9* 5.5*     Recent Labs Lab 05/17/17 0805 05/18/17 0714 05/19/17 0413 05/20/17 0815  AST 13*  --   --   --   ALT 9*  --   --   --   ALKPHOS 71  --   --   --   BILITOT 0.7  --   --   --   PROT 8.0  --   --   --   ALBUMIN 3.2* 2.9* 2.9* 2.8*     Recent Labs Lab 05/16/17 1422 05/17/17 0805 05/18/17 0714 05/19/17 0413 05/20/17 0815  WBC 11.2* 7.2 9.0 8.7 10.3  NEUTROABS 7.7  --   --   --   --   HGB 12.4* 12.9* 12.5* 12.3* 12.1*  HCT 37.8* 40.7 38.0* 39.1 37.3*  MCV 94.0 96.0 93.1 96.3 93.5  PLT 268 270 291 258 251   Blood Culture    Component Value Date/Time   SDES WOUND ULCER 05/16/2017 1426   SPECREQUEST NONE 05/16/2017 1426   CULT MULTIPLE ORGANISMS PRESENT, NONE PREDOMINANT (A) 05/16/2017 1426   REPTSTATUS 05/19/2017 FINAL 05/16/2017 1426     Recent Labs Lab 05/19/17 2152  05/20/17 1304 05/20/17 1633 05/20/17 2115 05/21/17 0825  GLUCAP 111* 92 134* 149* 132*    Lab Results  Component Value Date   INR 1.19 05/17/2017   INR 1.33 11/27/2014   INR 1.37 11/26/2014   Medications: . sodium chloride    . meropenem (MERREM) IV    . vancomycin Stopped (05/20/17 1228)   . cinacalcet  120 mg Oral Q supper  . doxercalciferol  6 mcg Intravenous Q M,W,F-HD  . ferric citrate  210 mg Oral TID WC  . insulin aspart  0-5 Units Subcutaneous QHS  . insulin aspart  0-9 Units Subcutaneous TID WC  . multivitamin  1 tablet Oral QHS  . senna-docusate  1 tablet Oral BID   Dialysis Orders:  [facility] 4.25 hours 425/800 EDW 92.5 kg will be lower at discharge 2K2.25 Ca L AVF Hectorol 4 mcg Heparin 6000 units (sensipar 60 2/day at home; bider auryxia 2 tid w/meals)  Background: 66 y.o.year-old AAM w/ESRD, DM2, HTN,  NICM with sCHF. Has had issues with foot ulcers with osteo of R foot earlier this year treated with V/F/hyperbaric O2. Current admission is for ulceration lateral aspect LEFT foot with associated chills, temp today to 102, foot pain/drainage/foul odor. Plain film with new lucency base of L 5th metatarsal c/w osteo. Admitted for IV ATB's. Had L 5th ray amputation 05/18/17.  Assessment/Plan: 1. Osteomyelitis L fifth metatarsal- MRI 5/22 - osteo no abscess. Janna Arch cx+ 5/21 Corynebacterium; s/p 5th ray amp 5/23  Persistent fever spikes. Fever spike yesterday to 102.6 WBC 10.3 - Pt had repeat [facility] 5/24 pending; Fortaz changed to meropenem for broader coverage 5/25 due to fever; CXR and foot xray ordered primary - Dr. [name] on call for the weekend to see - he has followed him in the office in the past 2. ESRD- MWF  No heparin. Under EDW. BP soft. Kept even Friday - next HD Monday 3. Secondary HPT- hectorol with HD. Sensipar 120/day. [name]. 2ac 4. DM- insulin per primary team 5. Anemia 2/2 ESRD- Hgb 12.1 no current ESA requirement 6. HTN/Volume  - no BP  meds. Under EDW BP ok; suspect some of lower BP due to fever. 7. PAD - has had prior evaluation for large vessel ds (Dr. Donnetta Hutching) and felt adequate flow to heal amputations  8. Persistent fevers - work up per #1  [name], PA-C [place] [phone] 05/21/2017,9:47 AM  LOS: 5 days   I have seen and examined this patient and agree with plan and assessment in the above note with renal recommendations/intervention highlighted. Continued temp spikes. ATB's adjusted by Dr. Rai/ID. Sensipar causing a lot of nausea - hold for now.  Faiza Bansal B,MD 05/21/2017 1:00 PM

## 2017-05-21 NOTE — Progress Notes (Signed)
[facility]                                                                              Patient Demographics  [name], is a 66 y.o. male, DOB - [birth year], VVO:[id number]  Admit date - 05/16/2017   Admitting Physician Waldemar Dickens, MD  Outpatient Primary MD for the patient is [name], MD  Outpatient specialists:   LOS - 5  days    Chief Complaint  Patient presents with  . Wound Check  . Fever       Brief summary   Patient is a 66 year old male with diabetes, ESRD, MWF, CAD, anemia, hypertension, systolic CHF presented to ED from ID clinic with worsening left foot pain and increasing drainage from a nonhealing foot ulcers. Per patient he had developed ulcers from the lateral aspect of his left foot after having his fourth toe amputated. For the last 3 months he had received vancomycin and cefazolin at dialysis and was recently transitioned to doxycycline in the ID clinic. In ED, he had a temp of 101.40F.   Assessment & Plan    Principal Problem:   Osteomyelitis of left foot (HCC) With nonhealing left foot ulcers possibly due to diabetic foot ulcers and underlying PAD - Patient has a history of osteomyelitis in his right foot. Patient received antibiotics via hemodialysis for several weeks prior to admission and was recently transitioned to oral doxycycline. - MRI of the left foot showed soft tissue ulcer overlying the base of the fifth metatarsal osteomyelitis of the base of the fifth metatarsal along the lateral margin, no abscess. - Patient underwent left fifth ray amputation and excision on 5/23, postop day # 3 - Antibiotics changed to IV meropenem, continued on IV vancomycin after discussion with ID. Blood cultures still negative to date. 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 - H&H currently stable     Benign essential HTN - BP still borderline  hypotensive     ESRD on dialysis [facility]) - Patient on hemodialysis MWF, nephrology consulted    Peripheral arterial disease ([place])  - vascular surgery following     Diabetes mellitus with complication ([place]) - YIR4W 6.7, cont sliding scale insulin    Code Status: Full CODE STATUS  DVT Prophylaxis:Heparin  Family  Communication: Discussed in detail with the patient, all imaging results, lab results explained to the patient and patient's wife at the bedside  Disposition Plan:   Time Spent in minutes   25 minutes  Procedures:  Hemodialysis   PROCEDURES: 05/18/17 1. A 5th ray amputation. 2. Partial excision of the cuboid.  Consultants:   Orthopedics   infectious disease Nephrology  Antimicrobials:   IV vancomycin with hemodialysis  IV Fortaz with hemodialysis    Medications  Scheduled Meds: . doxercalciferol  6 mcg Intravenous Q M,W,F-HD  . ferric citrate  210 mg Oral TID WC  . insulin aspart  0-5 Units Subcutaneous QHS  . insulin aspart  0-9 Units Subcutaneous TID WC  . multivitamin  1 tablet Oral QHS  . senna-docusate  1 tablet Oral BID   Continuous Infusions: . sodium chloride    . meropenem (MERREM) IV    . vancomycin Stopped (05/20/17 1228)   PRN Meds:.acetaminophen **OR** acetaminophen, HYDROcodone-acetaminophen, ondansetron **OR** ondansetron (ZOFRAN) IV, polyethylene glycol   Antibiotics   Anti-infectives    Start     Dose/Rate Route Frequency Ordered Stop   05/21/17 1800  meropenem (MERREM) 500 mg in sodium chloride 0.9 % 50 mL IVPB     500 mg 100 mL/hr over 30 Minutes Intravenous Every 24 hours 05/20/17 1334     05/20/17 1400  meropenem (MERREM) 500 mg in sodium chloride 0.9 % 50 mL IVPB  Status:  Discontinued     500 mg 100 mL/hr over 30 Minutes Intravenous Every 24 hours 05/20/17 1332 05/20/17 1334   05/20/17 1345  meropenem (MERREM) 500 mg in sodium chloride 0.9 % 50 mL IVPB     500 mg 100 mL/hr over 30 Minutes Intravenous  Once 05/20/17 1334  05/20/17 1449   05/18/17 0600  vancomycin (VANCOCIN) IVPB 1000 mg/200 mL premix  Status:  Discontinued     1,000 mg 200 mL/hr over 60 Minutes Intravenous On call to O.R. 05/17/17 2338 05/18/17 1113   05/16/17 2200  vancomycin (VANCOCIN) IVPB 1000 mg/200 mL premix     1,000 mg 200 mL/hr over 60 Minutes Intravenous  Once 05/16/17 2002 05/16/17 2214   05/16/17 2100  vancomycin (VANCOCIN) 2,000 mg in sodium chloride 0.9 % 500 mL IVPB  Status:  Discontinued     2,000 mg 250 mL/hr over 120 Minutes Intravenous  Once 05/16/17 1809 05/16/17 2000   05/16/17 2100  vancomycin (VANCOCIN) IVPB 1000 mg/200 mL premix     1,000 mg 200 mL/hr over 60 Minutes Intravenous  Once 05/16/17 2000 05/16/17 2030   05/16/17 2000  cefTAZidime (FORTAZ) 2 g in dextrose 5 % 50 mL IVPB  Status:  Discontinued     2 g 100 mL/hr over 30 Minutes Intravenous Every M-W-F (2000) 05/16/17 1610 05/20/17 1322   05/16/17 2000  vancomycin (VANCOCIN) IVPB 1000 mg/200 mL premix  Status:  Discontinued     1,000 mg 200 mL/hr over 60 Minutes Intravenous  Once 05/16/17 1633 05/16/17 1809   05/16/17 1615  vancomycin (VANCOCIN) IVPB 1000 mg/200 mL premix     1,000 mg 200 mL/hr over 60 Minutes Intravenous Every M-W-F (Hemodialysis) 05/16/17 1603     05/16/17 1600  vancomycin (VANCOCIN) 2,000 mg in sodium chloride 0.9 % 500 mL IVPB  Status:  Discontinued     2,000 mg 250 mL/hr over 120 Minutes Intravenous  Once 05/16/17 1603 05/16/17 1609        Subjective:   Moshe Wenger was seen and examined today.Still spiking fevers although BP is stabilizing. Overall patient feels that he is improving. No chills.   Patient denies chest pain, shortness of breath, abdominal pain, N/V, new weakness, numbess, tingling.  Objective:   Vitals:   05/20/17 1901 05/20/17 2108 05/21/17 0433 05/21/17 1016  BP:  (!) 97/46 (!) 126/55 (!) 113/54  Pulse:  79 74 81  Resp:  16 16 17   Temp: (!) 100.4 F (38 C) 98.5 F (36.9 C) 99.7 F (37.6 C) (!) 100.7 F  (38.2 C)  TempSrc: Oral Oral Oral Oral  SpO2:  98% 97% 95%  Weight:  91.9 kg (202 lb 9.6 oz)    Height:        Intake/Output Summary (Last 24 hours) at 05/21/17 1434 Last data filed at 05/21/17 1017  Gross per 24 hour  Intake              950 ml  Output               51 ml  Net              899 ml     Wt Readings from Last 3 Encounters:  05/20/17 91.9 kg (202 lb 9.6 oz)  05/16/17 96.2 kg (212 lb)  03/07/17 95.7 kg (211 lb)     Exam  General: Alert and oriented 3 NAD  HEENT: PERRLA, EOMI  Neck: No JVD   Cardiovascular: S1 and S2 clear, RRR  Respiratory: CTA B  Gastrointestinal: Soft, NT, ND, NBS  Ext: left foot dressing intact. Right foot no issues   neuro: No new deficits  Skin:  Left foot dressing intact  psyc: Normal affect and demeanor, alert and oriented x3    Data Reviewed:  I have personally reviewed following labs and imaging studies  Micro Results Recent Results (from the past 240 hour(s))  Wound or Superficial Culture     Status: Abnormal   Collection Time: 05/16/17  2:26 PM  Result Value Ref Range Status   Specimen Description WOUND ULCER  Final   Special Requests NONE  Final   Gram Stain   Final    MODERATE WBC PRESENT, PREDOMINANTLY PMN FEW GRAM POSITIVE COCCI RARE GRAM NEGATIVE RODS    Culture MULTIPLE ORGANISMS PRESENT, NONE PREDOMINANT (A)  Final   Report Status 05/19/2017 FINAL  Final  MRSA PCR Screening     Status: None   Collection Time: 05/16/17 10:11 PM  Result Value Ref Range Status   MRSA by PCR NEGATIVE NEGATIVE Final    Comment:        The GeneXpert MRSA Assay (FDA approved for NASAL specimens only), is one component of a comprehensive MRSA colonization surveillance program. It is not intended to diagnose MRSA infection nor to guide or monitor treatment for MRSA infections.   Surgical pcr screen     Status: None   Collection Time: 05/17/17  9:57 PM  Result Value Ref Range Status   MRSA, PCR NEGATIVE NEGATIVE  Final   Staphylococcus aureus NEGATIVE NEGATIVE Final    Comment:        The Xpert SA Assay (FDA approved for NASAL specimens in patients over 2 years of age), is one component of a comprehensive surveillance program.  Test performance has been validated by [facility] for patients greater than or equal to 5 year old. It is not intended to diagnose infection nor to guide or monitor treatment.   Culture, blood (routine x 2)     Status: None (Preliminary result)   Collection Time: 05/19/17 10:10 PM  Result Value Ref Range Status   Specimen Description BLOOD RIGHT ARM  Final   Special Requests   Final    BOTTLES DRAWN AEROBIC AND ANAEROBIC Blood Culture adequate volume   Culture NO GROWTH 1 DAY  Final   Report Status PENDING  Incomplete  Culture, blood (routine x 2)     Status: None (Preliminary result)   Collection Time: 05/19/17 10:17 PM  Result Value Ref Range Status   Specimen Description BLOOD RIGHT HAND  Final   Special Requests   Final    BOTTLES DRAWN AEROBIC AND ANAEROBIC Blood Culture adequate volume   Culture NO GROWTH 1 DAY  Final   Report Status PENDING  Incomplete    Radiology Reports Dg  Chest 2 View  Result Date: 05/21/2017 CLINICAL DATA:  Peripheral vascular disease, diabetic neuropathy, fever, recent amputation LEFT foot, hypertension, cardiomyopathy EXAM: CHEST  2 VIEW COMPARISON:  05/18/2017 FINDINGS: Enlargement of cardiac silhouette. Atherosclerotic calcification aorta. Pulmonary vascular congestion. Lungs clear. No pleural effusion or pneumothorax. Bones unremarkable. IMPRESSION: Enlargement of cardiac silhouette with pulmonary vascular congestion. Aortic atherosclerosis. Electronically Signed   By: Lavonia Dana M.D.   On: 05/21/2017 12:17   Mr Foot Left Wo Contrast  Result Date: 05/17/2017 CLINICAL DATA:  Nonhealing ulcer involving the left foot. EXAM: MRI OF THE LEFT FOOT WITHOUT CONTRAST TECHNIQUE: Multiplanar, multisequence MR imaging of the left  forefoot was performed. No intravenous contrast was administered. COMPARISON:  None. FINDINGS: Bones/Joint/Cartilage Soft tissue ulcer overlying the base of the fifth metatarsal. Ulcer extends to the cortex of the base of the fifth metatarsal along the lateral margin with cortical irregularity, underlying bone marrow edema and T1 hypointensity most consistent with osteomyelitis. Amputation of the fourth phalanx. No acute fracture or dislocation. Normal alignment. No joint effusion. Mild osteoarthritis of the first MTP joint. Mild osteoarthritis of the second MTP joint. Mild osteoarthritis of the navicular- medial cuneiform joint. Ligaments Collateral ligaments are intact.  Lisfranc ligament is intact. Muscles and Tendons Flexor, peroneal and extensor compartment tendons are intact. Generalized muscle atrophy. Soft tissue No fluid collection or hematoma. No soft tissue mass. Soft tissue edema along the lateral aspect of the foot overlying the fifth metatarsal consistent with cellulitis. IMPRESSION: 1. Soft tissue ulcer overlying the base of the fifth metatarsal. 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 No drainable fluid collection to suggest an abscess. Electronically Signed   By: Kathreen Devoid   On: 05/17/2017 08:15   Dg Chest Port 1 View  Result Date: 05/18/2017 CLINICAL DATA:  Shortness of Breath EXAM: PORTABLE CHEST 1 VIEW COMPARISON:  09/11/2016 FINDINGS: Cardiac shadow is mildly enlarged. The lungs are well aerated bilaterally. No focal infiltrate or sizable effusion is noted. No bony abnormality is noted IMPRESSION: No active disease. Electronically Signed   By: Inez Catalina M.D.   On: 05/18/2017 07:58   Dg Foot Complete Left  Result Date: 05/21/2017 CLINICAL DATA:  Peripheral vascular disease, diabetic neuropathy, LEFT toe amputation, irrigation, debridement, fever, history hypertension, cardiomyopathy EXAM: LEFT FOOT - COMPLETE 3+ VIEW COMPARISON:  05/16/2017  FINDINGS: Interval amputation of the fifth ray soft tissue swelling, skin clips and overlying dressing artifacts. Prior amputation of fourth toe. Diffuse osseous demineralization. Old healed fracture of the proximal phalanx great toe. No acute fracture or dislocation. Cortical margination of the lateral base of the fourth metatarsal and the lateral margin of the cuboid appears indistinct, could be related to surgery but cannot exclude bone destruction / osteomyelitis. Scattered small vessel vascular calcifications. IMPRESSION: Interval amputation of the fifth ray. Question cortical loss at the lateral base of the fourth metatarsal at the lateral margin of the cuboid, question related to preceding surgery but unable to exclude subtle bone destruction/osteomyelitis. Electronically Signed   By: Lavonia Dana M.D.   On: 05/21/2017 12:16   Dg Foot Complete Left  Result Date: 05/16/2017 CLINICAL DATA:  Left foot soft tissue wound with drainage and pain and fevers, initial encounter EXAM: LEFT FOOT - COMPLETE 3+ VIEW COMPARISON:  05/03/2017 FINDINGS: Degenerative changes at first MTP joint are noted. Soft tissue defect is again noted at the distal aspect of the fifth toe as well as laterally in the foot. There are some erosive  changes identified at the base of the fifth metatarsal which are new from the prior exam consistent with osteomyelitis. Chronic changes in the heads of the second and fourth metatarsals are seen. Changes of prior fourth toe amputation are again noted. IMPRESSION: New area of lucency at the base of the fifth metatarsal. This is consistent with underlying osteomyelitis. Electronically Signed   By: Inez Catalina M.D.   On: 05/16/2017 14:14   Dg Foot Complete Left  Result Date: 05/03/2017 CLINICAL DATA:  66 year old male with chronic left foot wounds EXAM: LEFT FOOT - COMPLETE 3+ VIEW COMPARISON:  Prior radiographs of the left foot 01/04/2017 FINDINGS: Stable postsurgical changes of prior fourth  digit amputation. Sequelae of prior Freiberg's infraction at the head of the second metatarsal are similar compared to prior. No evidence of new bony refraction or due destruction to suggest acute osteomyelitis. Soft tissue irregularity present at the tip of the fifth toe, and laterally adjacent to the base of the fifth metatarsal consistent with ulcerations. Calcifications are present of the digital arteries bilaterally. Degenerative osteoarthritis present in the great toe MTP joint. IMPRESSION: 1. No convincing conventional radiographic evidence of active osteomyelitis. 2. Active soft tissue ulceration at the tip of the fifth digit, and at the lateral aspect of the foot adjacent the base of the fifth metatarsal. 3. Surgical changes of prior fourth toe amputation. 4. 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 5. Small vessel atherosclerotic vascular calcifications. 6. 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 Electronically Signed   By: Jacqulynn Cadet M.D.   On: 05/03/2017 14:23    Lab Data:  CBC:  Recent Labs Lab 05/16/17 1422 05/17/17 0805 05/18/17 0714 05/19/17 0413 05/20/17 0815  WBC 11.2* 7.2 9.0 8.7 10.3  NEUTROABS 7.7  --   --   --   --   HGB 12.4* 12.9* 12.5* 12.3* 12.1*  HCT 37.8* 40.7 38.0* 39.1 37.3*  MCV 94.0 96.0 93.1 96.3 93.5  PLT 268 270 291 258 242   Basic Metabolic Panel:  Recent Labs Lab 05/16/17 1422 05/17/17 0805 05/18/17 0714 05/19/17 0413 05/20/17 0815  NA 135 136 131* 134* 132*  K 5.0 4.4 4.5 4.9 4.4  CL 95* 93* 90* 94* 95*  CO2 25 29 24 26  21*  GLUCOSE 142* 152* 156* 156* 113*  BUN 67* 33* 48* 33* 51*  CREATININE 13.82* 9.83* 11.94* 9.76* 13.21*  CALCIUM 9.3 9.5 8.9 8.0* 7.9*  PHOS  --   --  6.9* 4.9* 5.5*   GFR: Estimated Creatinine Clearance: 6.3 mL/min (A) (by C-G formula based on SCr of 13.21 mg/dL (H)). Liver Function Tests:  Recent Labs Lab  05/17/17 0805 05/18/17 0714 05/19/17 0413 05/20/17 0815  AST 13*  --   --   --   ALT 9*  --   --   --   ALKPHOS 71  --   --   --   BILITOT 0.7  --   --   --   PROT 8.0  --   --   --   ALBUMIN 3.2* 2.9* 2.9* 2.8*   No results for input(s): LIPASE, AMYLASE in the last 168 hours. No results for input(s): AMMONIA in the last 168 hours. Coagulation Profile:  Recent Labs Lab 05/17/17 0805  INR 1.19   Cardiac Enzymes: No results for input(s): CKTOTAL, CKMB, CKMBINDEX, TROPONINI in the last 168 hours. BNP (last 3 results) No results for input(s):  PROBNP in the last 8760 hours. HbA1C: No results for input(s): HGBA1C in the last 72 hours. CBG:  Recent Labs Lab 05/20/17 1304 05/20/17 1633 05/20/17 2115 05/21/17 0825 05/21/17 1213  GLUCAP 92 134* 149* 132* 162*   Lipid Profile: No results for input(s): CHOL, HDL, LDLCALC, TRIG, CHOLHDL, LDLDIRECT in the last 72 hours. Thyroid Function Tests: No results for input(s): TSH, T4TOTAL, FREET4, T3FREE, THYROIDAB in the last 72 hours. Anemia Panel: No results for input(s): VITAMINB12, FOLATE, FERRITIN, TIBC, IRON, RETICCTPCT in the last 72 hours. Urine analysis:    Component Value Date/Time   COLORURINE YELLOW 09/11/2016 1933   APPEARANCEUR CLEAR 09/11/2016 1933   LABSPEC 1.015 09/11/2016 1933   PHURINE 8.5 (H) 09/11/2016 1933   GLUCOSEU 100 (A) 09/11/2016 1933   HGBUR NEGATIVE 09/11/2016 1933   BILIRUBINUR SMALL (A) 09/11/2016 1933   KETONESUR NEGATIVE 09/11/2016 1933   PROTEINUR 100 (A) 09/11/2016 1933   UROBILINOGEN 0.2 11/22/2014 1740   NITRITE NEGATIVE 09/11/2016 1933   LEUKOCYTESUR NEGATIVE 09/11/2016 1933     Makailey Hodgkin M.D. [facility] 05/21/2017, 2:34 PM  Pager: [phone] Between 7am to 7pm - call Pager - [phone]  After 7pm go to [email or web address] - password [email or web address]  Call night coverage person covering after 7pm

## 2017-05-22 DIAGNOSIS — Z89422 Acquired absence of other left toe(s): Secondary | ICD-10-CM

## 2017-05-22 DIAGNOSIS — Z8739 Personal history of other diseases of the musculoskeletal system and connective tissue: Secondary | ICD-10-CM

## 2017-05-22 DIAGNOSIS — R509 Fever, unspecified: Secondary | ICD-10-CM

## 2017-05-22 LAB — GLUCOSE, CAPILLARY
GLUCOSE-CAPILLARY: 146 mg/dL — AB (ref 65–99)
GLUCOSE-CAPILLARY: 167 mg/dL — AB (ref 65–99)
GLUCOSE-CAPILLARY: 121 mg/dL — AB (ref 65–99)
Glucose-Capillary: 156 mg/dL — ABNORMAL HIGH (ref 65–99)

## 2017-05-22 MED ORDER — PRIMIDONE 50 MG PO TABS
25.0000 mg | ORAL_TABLET | Freq: Every day | ORAL | Status: DC
  Administered 2017-05-22 – 2017-05-24 (×3): 25 mg via ORAL
  Filled 2017-05-22 (×6): qty 0.5

## 2017-05-22 MED ORDER — DEXTROSE 5 % IV SOLN
1.0000 g | Freq: Once | INTRAVENOUS | Status: AC
  Administered 2017-05-22: 1 g via INTRAVENOUS
  Filled 2017-05-22: qty 1

## 2017-05-22 MED ORDER — DEXTROSE 5 % IV SOLN
2.0000 g | INTRAVENOUS | Status: DC
  Administered 2017-05-23 – 2017-05-25 (×2): 2 g via INTRAVENOUS
  Filled 2017-05-22 (×3): qty 2

## 2017-05-22 MED ORDER — METRONIDAZOLE 500 MG PO TABS
500.0000 mg | ORAL_TABLET | Freq: Three times a day (TID) | ORAL | Status: DC
  Administered 2017-05-22 – 2017-05-25 (×10): 500 mg via ORAL
  Filled 2017-05-22 (×10): qty 1

## 2017-05-22 NOTE — Progress Notes (Signed)
[facility]                                                                              Patient Demographics  [name], is a 66 y.o. male, DOB - 1951/04/03, QQV:[id number]  Admit date - 05/16/2017   Admitting Physician Waldemar Dickens, MD  Outpatient Primary MD for the patient is [name], MD  Outpatient specialists:   LOS - 6  days    Chief Complaint  Patient presents with  . Wound Check  . Fever       Brief summary   Patient is a 66 year old male with diabetes, ESRD, MWF, CAD, anemia, hypertension, systolic CHF presented to ED from ID clinic with worsening left foot pain and increasing drainage from a nonhealing foot ulcers. Per patient he had developed ulcers from the lateral aspect of his left foot after having his fourth toe amputated. For the last 3 months he had received vancomycin and cefazolin at dialysis and was recently transitioned to doxycycline in the ID clinic. In ED, he had a temp of 101.12F.   Assessment & Plan    Principal Problem:   Osteomyelitis of left foot (HCC) With nonhealing left foot ulcers possibly due to diabetic foot ulcers and underlying PAD - Patient has a history of osteomyelitis in his right foot. Patient received antibiotics via hemodialysis for several weeks prior to admission and was recently transitioned to oral doxycycline. - MRI of the left foot showed soft tissue ulcer overlying the base of the fifth metatarsal osteomyelitis of the base of the fifth metatarsal along the lateral margin, no abscess. - Patient underwent left fifth ray amputation and excision on 5/23 - Continue IV vancomycin and meropenem. Blood cultures still negative. Follow MRI of the left foot.   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  -will start on primidone, lowest dose per his renal function, pharmacy to adjust dose  Code Status: Full CODE STATUS  DVT Prophylaxis:Heparin  Family  Communication: Discussed in detail with the patient, all imaging results, lab results explained to the patient   Disposition Plan:   Time Spent in minutes   25 minutes  Procedures:  Hemodialysis   PROCEDURES: 05/18/17 1. A 5th ray amputation. 2. Partial excision of the cuboid.  Consultants:   Orthopedics   infectious disease Nephrology  Antimicrobials:   IV vancomycin with hemodialysis  IV Fortaz with hemodialysis    Medications  Scheduled Meds: . doxercalciferol  6 mcg Intravenous Q M,W,F-HD  . ferric citrate  210 mg Oral TID WC  . insulin aspart  0-5 Units Subcutaneous QHS  . insulin aspart  0-9 Units Subcutaneous TID WC  . metroNIDAZOLE  500 mg Oral Q8H  . multivitamin  1 tablet Oral QHS  . senna-docusate  1 tablet Oral BID   Continuous Infusions: . sodium chloride    . cefTAZidime (FORTAZ)  IV    . [START ON 05/23/2017] cefTAZidime (FORTAZ)  IV    . vancomycin Stopped (05/20/17 1228)   PRN Meds:.acetaminophen **OR** acetaminophen, HYDROcodone-acetaminophen, ondansetron **OR** ondansetron (ZOFRAN) IV, polyethylene glycol   Antibiotics   Anti-infectives    Start     Dose/Rate Route Frequency Ordered Stop   05/23/17 1800  cefTAZidime (FORTAZ) 2 g in dextrose 5 % 50 mL IVPB     2 g 100 mL/hr over 30 Minutes Intravenous Every M-W-F (1800) 05/22/17 1056     05/22/17 1800  cefTAZidime (FORTAZ) 1 g in dextrose 5 % 50 mL IVPB     1 g 100 mL/hr over 30 Minutes Intravenous  Once 05/22/17 1056     05/22/17 1400  metroNIDAZOLE (FLAGYL) tablet 500 mg     500 mg Oral Every 8 hours 05/22/17 1028       05/21/17 1800  meropenem (MERREM) 500 mg in sodium chloride 0.9 % 50 mL IVPB  Status:  Discontinued     500 mg 100 mL/hr over 30 Minutes Intravenous Every 24 hours 05/20/17 1334 05/22/17 1028   05/20/17 1400  meropenem (MERREM) 500 mg in sodium chloride 0.9 % 50 mL IVPB  Status:  Discontinued     500 mg 100 mL/hr over 30 Minutes Intravenous Every 24 hours 05/20/17 1332 05/20/17 1334   05/20/17 1345  meropenem (MERREM) 500 mg in sodium chloride 0.9 % 50 mL IVPB     500 mg 100 mL/hr over 30 Minutes Intravenous  Once 05/20/17 1334 05/20/17 1449   05/18/17 0600  vancomycin (VANCOCIN) IVPB 1000 mg/200 mL premix  Status:  Discontinued     1,000 mg 200 mL/hr over 60 Minutes Intravenous On call to O.R. 05/17/17 2338 05/18/17 1113   05/16/17 2200  vancomycin (VANCOCIN) IVPB 1000 mg/200 mL premix     1,000 mg 200 mL/hr over 60 Minutes Intravenous  Once 05/16/17 2002 05/16/17 2214   05/16/17 2100  vancomycin (VANCOCIN) 2,000 mg in sodium chloride 0.9 % 500 mL IVPB  Status:  Discontinued     2,000 mg 250 mL/hr over 120 Minutes Intravenous  Once 05/16/17 1809 05/16/17 2000   05/16/17 2100  vancomycin (VANCOCIN) IVPB 1000 mg/200 mL premix     1,000 mg 200 mL/hr over 60 Minutes Intravenous  Once 05/16/17 2000 05/16/17 2030   05/16/17 2000  cefTAZidime (FORTAZ) 2 g in dextrose 5 % 50 mL IVPB  Status:  Discontinued     2 g 100 mL/hr over 30 Minutes Intravenous Every M-W-F (2000) 05/16/17 1610 05/20/17 1322   05/16/17 2000  vancomycin (VANCOCIN) IVPB 1000 mg/200 mL premix  Status:  Discontinued     1,000 mg 200 mL/hr over 60 Minutes Intravenous  Once 05/16/17 1633 05/16/17 1809   05/16/17 1615  vancomycin (VANCOCIN) IVPB 1000 mg/200 mL premix     1,000 mg 200 mL/hr over 60 Minutes Intravenous Every M-W-F (Hemodialysis) 05/16/17 1603     05/16/17 1600  vancomycin (VANCOCIN) 2,000 mg in sodium chloride 0.9 % 500 mL IVPB  Status:  Discontinued     2,000 mg 250 mL/hr over 120 Minutes Intravenous  Once  05/16/17 1603 05/16/17 1609        Subjective:   [name] was seen and examined today.Overnight spiked fever 101.60F.  BP improving. States  he needs something for his essential tremors.   Patient denies chest pain, shortness of breath, abdominal pain, N/V, new weakness, numbess, tingling.  Objective:   Vitals:   05/21/17 2028 05/21/17 2330 05/22/17 0427 05/22/17 1046  BP: (!) 145/56  131/60 (!) 144/68  Pulse: 83  71 74  Resp: 17  16 18   Temp: (!) 101.2 F (38.4 C) 99.8 F (37.7 C) 98.7 F (37.1 C) 98.8 F (37.1 C)  TempSrc: Oral Oral Oral Oral  SpO2: 100%  100% 99%  Weight: 92.4 kg (203 lb 12.8 oz)     Height:        Intake/Output Summary (Last 24 hours) at 05/22/17 1405 Last data filed at 05/22/17 0600  Gross per 24 hour  Intake              530 ml  Output                0 ml  Net              530 ml     Wt Readings from Last 3 Encounters:  05/21/17 92.4 kg (203 lb 12.8 oz)  05/16/17 96.2 kg (212 lb)  03/07/17 95.7 kg (211 lb)     Exam  General: Alert and oriented 3 NAD  HEENT: PERRLA, EOMI  Neck: No JVD   Cardiovascular:S1 and S2 clear, regular rate and rhythm  Respiratory:Clear to auscultation bilaterally  Gastrointestinal: Soft, nontender, nondistended  Ext: left foot dressing intact. Right foot no issues   neuro: No new deficits  Skin:  Left foot dressing intact  psyc: Normal affect and demeanor, alert and oriented x3    Data Reviewed:  I have personally reviewed following labs and imaging studies  Micro Results Recent Results (from the past 240 hour(s))  Wound or Superficial Culture     Status: Abnormal   Collection Time: 05/16/17  2:26 PM  Result Value Ref Range Status   Specimen Description WOUND ULCER  Final   Special Requests NONE  Final   Gram Stain   Final    MODERATE WBC PRESENT, PREDOMINANTLY PMN FEW GRAM POSITIVE COCCI RARE GRAM NEGATIVE RODS    Culture MULTIPLE ORGANISMS PRESENT, NONE PREDOMINANT (A)  Final    Report Status 05/19/2017 FINAL  Final  MRSA PCR Screening     Status: None   Collection Time: 05/16/17 10:11 PM  Result Value Ref Range Status   MRSA by PCR NEGATIVE NEGATIVE Final    Comment:        The GeneXpert MRSA Assay (FDA approved for NASAL specimens only), is one component of a comprehensive MRSA colonization surveillance program. It is not intended to diagnose MRSA infection nor to guide or monitor treatment for MRSA infections.   Surgical pcr screen     Status: None   Collection Time: 05/17/17  9:57 PM  Result Value Ref Range Status   MRSA, PCR NEGATIVE NEGATIVE Final   Staphylococcus aureus NEGATIVE NEGATIVE Final    Comment:        The Xpert SA Assay (FDA approved for NASAL specimens in patients over 81 years of age), is one component of a comprehensive surveillance program.  Test performance has been validated by [facility] for patients greater than or equal to 23 year old. It is not intended to diagnose infection nor to guide or monitor treatment.   Culture, blood (routine x 2)     Status: None (Preliminary result)   Collection Time: 05/19/17 10:10 PM  Result Value Ref Range Status   Specimen Description BLOOD RIGHT ARM  Final   Special Requests   Final    BOTTLES DRAWN AEROBIC AND ANAEROBIC Blood Culture adequate volume   Culture NO GROWTH 2 DAYS  Final   Report Status PENDING  Incomplete  Culture, blood (routine x 2)     Status: None (Preliminary result)   Collection Time: 05/19/17 10:17 PM  Result Value Ref Range Status   Specimen Description BLOOD RIGHT HAND  Final   Special Requests   Final    BOTTLES DRAWN AEROBIC AND ANAEROBIC Blood Culture adequate volume   Culture NO GROWTH 2 DAYS  Final   Report Status PENDING  Incomplete    Radiology Reports Dg Chest 2 View  Result Date: 05/21/2017 CLINICAL DATA:  Peripheral vascular disease, diabetic neuropathy, fever, recent amputation LEFT foot, hypertension, cardiomyopathy EXAM: CHEST  2 VIEW  COMPARISON:  05/18/2017 FINDINGS: Enlargement of cardiac silhouette. Atherosclerotic calcification aorta. Pulmonary vascular congestion. Lungs clear. No pleural effusion or pneumothorax. Bones unremarkable. IMPRESSION: Enlargement of cardiac silhouette with pulmonary vascular congestion. Aortic atherosclerosis. Electronically Signed   By: Lavonia Dana M.D.   On: 05/21/2017 12:17   Mr Foot Left Wo Contrast  Result Date: 05/17/2017 CLINICAL DATA:  Nonhealing ulcer involving the left foot. EXAM: MRI OF THE LEFT FOOT WITHOUT CONTRAST TECHNIQUE: Multiplanar, multisequence MR imaging of the left forefoot was performed. No intravenous contrast was administered. COMPARISON:  None. FINDINGS: Bones/Joint/Cartilage Soft tissue ulcer overlying the base of the fifth metatarsal. Ulcer extends to the cortex of the base of the fifth metatarsal along the lateral margin with cortical irregularity, underlying bone marrow edema and T1 hypointensity most consistent with osteomyelitis. Amputation of the fourth phalanx. No acute fracture or dislocation. Normal alignment. No joint effusion. Mild osteoarthritis of the first MTP joint. Mild osteoarthritis of the second MTP joint. Mild osteoarthritis of the navicular- medial cuneiform joint. Ligaments Collateral ligaments are intact.  Lisfranc ligament is intact. Muscles and Tendons Flexor, peroneal and extensor compartment tendons are intact. Generalized muscle atrophy. Soft tissue No fluid collection or hematoma. No soft tissue mass. Soft tissue edema along the lateral aspect of the foot overlying the fifth metatarsal consistent with cellulitis. IMPRESSION: 1. Soft tissue ulcer overlying the base of the fifth metatarsal. 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 No drainable fluid collection to suggest an abscess. Electronically Signed   By: Kathreen Devoid   On: 05/17/2017 08:15   Dg Chest Port 1 View  Result Date: 05/18/2017 CLINICAL DATA:  Shortness  of Breath EXAM: PORTABLE CHEST 1 VIEW COMPARISON:  09/11/2016 FINDINGS: Cardiac shadow is mildly enlarged. The lungs are well aerated bilaterally. No focal infiltrate or sizable effusion is noted. No bony abnormality is noted IMPRESSION: No active disease. Electronically Signed   By: Inez Catalina M.D.   On: 05/18/2017 07:58   Dg Foot Complete Left  Result Date: 05/21/2017 CLINICAL DATA:  Peripheral vascular disease, diabetic neuropathy, LEFT toe amputation, irrigation, debridement, fever, history hypertension, cardiomyopathy EXAM: LEFT FOOT - COMPLETE 3+ VIEW COMPARISON:  05/16/2017 FINDINGS: Interval amputation of the fifth ray soft tissue swelling, skin clips and overlying dressing artifacts. Prior amputation of fourth toe. Diffuse osseous demineralization. Old healed fracture of the proximal phalanx great toe. No acute fracture or dislocation. Cortical margination of the lateral base of the fourth metatarsal and the lateral margin of the cuboid appears indistinct, could be related to surgery but cannot exclude bone destruction / osteomyelitis.  Scattered small vessel vascular calcifications. IMPRESSION: Interval amputation of the fifth ray. Question cortical loss at the lateral base of the fourth metatarsal at the lateral margin of the cuboid, question related to preceding surgery but unable to exclude subtle bone destruction/osteomyelitis. Electronically Signed   By: Lavonia Dana M.D.   On: 05/21/2017 12:16   Dg Foot Complete Left  Result Date: 05/16/2017 CLINICAL DATA:  Left foot soft tissue wound with drainage and pain and fevers, initial encounter EXAM: LEFT FOOT - COMPLETE 3+ VIEW COMPARISON:  05/03/2017 FINDINGS: Degenerative changes at first MTP joint are noted. Soft tissue defect is again noted at the distal aspect of the fifth toe as well as laterally in the foot. There are some erosive changes identified at the base of the fifth metatarsal which are new from the prior exam consistent with  osteomyelitis. Chronic changes in the heads of the second and fourth metatarsals are seen. Changes of prior fourth toe amputation are again noted. IMPRESSION: New area of lucency at the base of the fifth metatarsal. This is consistent with underlying osteomyelitis. Electronically Signed   By: Inez Catalina M.D.   On: 05/16/2017 14:14   Dg Foot Complete Left  Result Date: 05/03/2017 CLINICAL DATA:  66 year old male with chronic left foot wounds EXAM: LEFT FOOT - COMPLETE 3+ VIEW COMPARISON:  Prior radiographs of the left foot 01/04/2017 FINDINGS: Stable postsurgical changes of prior fourth digit amputation. Sequelae of prior Freiberg's infraction at the head of the second metatarsal are similar compared to prior. No evidence of new bony refraction or due destruction to suggest acute osteomyelitis. Soft tissue irregularity present at the tip of the fifth toe, and laterally adjacent to the base of the fifth metatarsal consistent with ulcerations. Calcifications are present of the digital arteries bilaterally. Degenerative osteoarthritis present in the great toe MTP joint. IMPRESSION: 1. No convincing conventional radiographic evidence of active osteomyelitis. 2. Active soft tissue ulceration at the tip of the fifth digit, and at the lateral aspect of the foot adjacent the base of the fifth metatarsal. 3. Surgical changes of prior fourth toe amputation. 4. 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 5. Small vessel atherosclerotic vascular calcifications. 6. 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 Electronically Signed   By: Jacqulynn Cadet M.D.   On: 05/03/2017 14:23    Lab Data:  CBC:  Recent Labs Lab 05/16/17 1422 05/17/17 0805 05/18/17 0714 05/19/17 0413 05/20/17 0815  WBC 11.2* 7.2 9.0 8.7 10.3  NEUTROABS 7.7  --   --   --   --   HGB 12.4* 12.9* 12.5* 12.3* 12.1*  HCT 37.8* 40.7 38.0* 39.1 37.3*  MCV 94.0  96.0 93.1 96.3 93.5  PLT 268 270 291 258 993   Basic Metabolic Panel:  Recent Labs Lab 05/16/17 1422 05/17/17 0805 05/18/17 0714 05/19/17 0413 05/20/17 0815  NA 135 136 131* 134* 132*  K 5.0 4.4 4.5 4.9 4.4  CL 95* 93* 90* 94* 95*  CO2 25 29 24 26  21*  GLUCOSE 142* 152* 156* 156* 113*  BUN 67* 33* 48* 33* 51*  CREATININE 13.82* 9.83* 11.94* 9.76* 13.21*  CALCIUM 9.3 9.5 8.9 8.0* 7.9*  PHOS  --   --  6.9* 4.9* 5.5*   GFR: Estimated Creatinine Clearance: 6.3 mL/min (A) (by C-G formula based on SCr of 13.21 mg/dL (H)). Liver Function Tests:  Recent Labs Lab 05/17/17 0805 05/18/17 7169 05/19/17 0413 05/20/17  0815  AST 13*  --   --   --   ALT 9*  --   --   --   ALKPHOS 71  --   --   --   BILITOT 0.7  --   --   --   PROT 8.0  --   --   --   ALBUMIN 3.2* 2.9* 2.9* 2.8*   No results for input(s): LIPASE, AMYLASE in the last 168 hours. No results for input(s): AMMONIA in the last 168 hours. Coagulation Profile:  Recent Labs Lab 05/17/17 0805  INR 1.19   Cardiac Enzymes: No results for input(s): CKTOTAL, CKMB, CKMBINDEX, TROPONINI in the last 168 hours. BNP (last 3 results) No results for input(s): PROBNP in the last 8760 hours. HbA1C: No results for input(s): HGBA1C in the last 72 hours. CBG:  Recent Labs Lab 05/21/17 1213 05/21/17 1544 05/21/17 2033 05/22/17 0824 05/22/17 1248  GLUCAP 162* 115* 146* 146* 167*   Lipid Profile: No results for input(s): CHOL, HDL, LDLCALC, TRIG, CHOLHDL, LDLDIRECT in the last 72 hours. Thyroid Function Tests: No results for input(s): TSH, T4TOTAL, FREET4, T3FREE, THYROIDAB in the last 72 hours. Anemia Panel: No results for input(s): VITAMINB12, FOLATE, FERRITIN, TIBC, IRON, RETICCTPCT in the last 72 hours. Urine analysis:    Component Value Date/Time   COLORURINE YELLOW 09/11/2016 1933   APPEARANCEUR CLEAR 09/11/2016 1933   LABSPEC 1.015 09/11/2016 1933   PHURINE 8.5 (H) 09/11/2016 1933   GLUCOSEU 100 (A) 09/11/2016  1933   HGBUR NEGATIVE 09/11/2016 1933   BILIRUBINUR SMALL (A) 09/11/2016 1933   KETONESUR NEGATIVE 09/11/2016 1933   PROTEINUR 100 (A) 09/11/2016 1933   UROBILINOGEN 0.2 11/22/2014 1740   NITRITE NEGATIVE 09/11/2016 1933   LEUKOCYTESUR NEGATIVE 09/11/2016 1933     Ripudeep Rai M.D. [facility] 05/22/2017, 2:05 PM  Pager: [phone] Between 7am to 7pm - call Pager - [phone]  After 7pm go to [email or web address] - password [email or web address]  Call night coverage person covering after 7pm

## 2017-05-22 NOTE — Progress Notes (Signed)
Subjective:  He is very impatient to be discharged.  Antibiotics:  Anti-infectives    Start     Dose/Rate Route Frequency Ordered Stop   05/23/17 1800  cefTAZidime (FORTAZ) 2 g in dextrose 5 % 50 mL IVPB     2 g 100 mL/hr over 30 Minutes Intravenous Every M-W-F (1800) 05/22/17 1056     05/22/17 1800  cefTAZidime (FORTAZ) 1 g in dextrose 5 % 50 mL IVPB     1 g 100 mL/hr over 30 Minutes Intravenous  Once 05/22/17 1056     05/22/17 1400  metroNIDAZOLE (FLAGYL) tablet 500 mg     500 mg Oral Every 8 hours 05/22/17 1028     05/21/17 1800  meropenem (MERREM) 500 mg in sodium chloride 0.9 % 50 mL IVPB  Status:  Discontinued     500 mg 100 mL/hr over 30 Minutes Intravenous Every 24 hours 05/20/17 1334 05/22/17 1028   05/20/17 1400  meropenem (MERREM) 500 mg in sodium chloride 0.9 % 50 mL IVPB  Status:  Discontinued     500 mg 100 mL/hr over 30 Minutes Intravenous Every 24 hours 05/20/17 1332 05/20/17 1334   05/20/17 1345  meropenem (MERREM) 500 mg in sodium chloride 0.9 % 50 mL IVPB     500 mg 100 mL/hr over 30 Minutes Intravenous  Once 05/20/17 1334 05/20/17 1449   05/18/17 0600  vancomycin (VANCOCIN) IVPB 1000 mg/200 mL premix  Status:  Discontinued     1,000 mg 200 mL/hr over 60 Minutes Intravenous On call to O.R. 05/17/17 2338 05/18/17 1113   05/16/17 2200  vancomycin (VANCOCIN) IVPB 1000 mg/200 mL premix     1,000 mg 200 mL/hr over 60 Minutes Intravenous  Once 05/16/17 2002 05/16/17 2214   05/16/17 2100  vancomycin (VANCOCIN) 2,000 mg in sodium chloride 0.9 % 500 mL IVPB  Status:  Discontinued     2,000 mg 250 mL/hr over 120 Minutes Intravenous  Once 05/16/17 1809 05/16/17 2000   05/16/17 2100  vancomycin (VANCOCIN) IVPB 1000 mg/200 mL premix     1,000 mg 200 mL/hr over 60 Minutes Intravenous  Once 05/16/17 2000 05/16/17 2030   05/16/17 2000  cefTAZidime (FORTAZ) 2 g in dextrose 5 % 50 mL IVPB  Status:  Discontinued     2 g 100 mL/hr over 30 Minutes Intravenous Every  M-W-F (2000) 05/16/17 1610 05/20/17 1322   05/16/17 2000  vancomycin (VANCOCIN) IVPB 1000 mg/200 mL premix  Status:  Discontinued     1,000 mg 200 mL/hr over 60 Minutes Intravenous  Once 05/16/17 1633 05/16/17 1809   05/16/17 1615  vancomycin (VANCOCIN) IVPB 1000 mg/200 mL premix     1,000 mg 200 mL/hr over 60 Minutes Intravenous Every M-W-F (Hemodialysis) 05/16/17 1603     05/16/17 1600  vancomycin (VANCOCIN) 2,000 mg in sodium chloride 0.9 % 500 mL IVPB  Status:  Discontinued     2,000 mg 250 mL/hr over 120 Minutes Intravenous  Once 05/16/17 1603 05/16/17 1609      Medications: Scheduled Meds: . doxercalciferol  6 mcg Intravenous Q M,W,F-HD  . ferric citrate  210 mg Oral TID WC  . insulin aspart  0-5 Units Subcutaneous QHS  . insulin aspart  0-9 Units Subcutaneous TID WC  . metroNIDAZOLE  500 mg Oral Q8H  . multivitamin  1 tablet Oral QHS  . senna-docusate  1 tablet Oral BID   Continuous Infusions: . sodium chloride    . cefTAZidime (  FORTAZ)  IV    . [START ON 05/23/2017] cefTAZidime (FORTAZ)  IV    . vancomycin Stopped (05/20/17 1228)   PRN Meds:.acetaminophen **OR** acetaminophen, HYDROcodone-acetaminophen, ondansetron **OR** ondansetron (ZOFRAN) IV, polyethylene glycol    Objective: Weight change: 5 lb 6.2 oz (2.443 kg)  Intake/Output Summary (Last 24 hours) at 05/22/17 1407 Last data filed at 05/22/17 0600  Gross per 24 hour  Intake              530 ml  Output                0 ml  Net              530 ml   Blood pressure (!) 144/68, pulse 74, temperature 98.8 F (37.1 C), temperature source Oral, resp. rate 18, height 6\' 1"  (1.854 m), weight 203 lb 12.8 oz (92.4 kg), SpO2 99 %. Temp:  [98.7 F (37.1 C)-101.2 F (38.4 C)] 98.8 F (37.1 C) (05/27 1046) Pulse Rate:  [71-86] 74 (05/27 1046) Resp:  [16-18] 18 (05/27 1046) BP: (95-150)/(45-68) 144/68 (05/27 1046) SpO2:  [96 %-100 %] 99 % (05/27 1046) Weight:  [203 lb 12.8 oz (92.4 kg)] 203 lb 12.8 oz (92.4 kg)  (05/26 2028)  Physical Exam: General: Alert and awake, oriented x3, not in any acute distress. HEENT: anicteric sclera, pupils reactive to light and accommodation, EOMI CVS regular rate, normal r,  no murmur rubs or gallops Chest: clear to auscultation bilaterally, no wheezing, rales or rhonchi Abdomen: soft nontender, nondistended, normal bowel sounds, Extremities left foot wrapped Neuro: nonfocal  CBC:  CBC Latest Ref Rng & Units 05/20/2017 05/19/2017 05/18/2017  WBC 4.0 - 10.5 K/uL 10.3 8.7 9.0  Hemoglobin 13.0 - 17.0 g/dL 12.1(L) 12.3(L) 12.5(L)  Hematocrit 39.0 - 52.0 % 37.3(L) 39.1 38.0(L)  Platelets 150 - 400 K/uL 251 258 291      BMET  Recent Labs  05/20/17 0815  NA 132*  K 4.4  CL 95*  CO2 21*  GLUCOSE 113*  BUN 51*  CREATININE 13.21*  CALCIUM 7.9*     Liver Panel   Recent Labs  05/20/17 0815  ALBUMIN 2.8*       Sedimentation Rate No results for input(s): ESRSEDRATE in the last 72 hours. C-Reactive Protein No results for input(s): CRP in the last 72 hours.  Micro Results: Recent Results (from the past 720 hour(s))  Aerobic/Anaerobic Culture (surgical/deep wound)     Status: Abnormal   Collection Time: 05/10/17 11:15 AM  Result Value Ref Range Status   Specimen Description FOOT DEEP TISSUE CULTURE, LEFT LATERAL FOOT  Final   Special Requests NONE  Final   Gram Stain   Final    FEW WBC PRESENT, PREDOMINANTLY PMN FEW GRAM POSITIVE COCCI FEW GRAM NEGATIVE RODS    Culture (A)  Final    MULTIPLE ORGANISMS PRESENT, NONE PREDOMINANT MODERATE BACTEROIDES SPECIES BETA LACTAMASE POSITIVE Performed at [facility], [place] [address] [place], [place] [address]    Report Status 05/15/2017 FINAL  Final  Wound or Superficial Culture     Status: Abnormal   Collection Time: 05/16/17  2:26 PM  Result Value Ref Range Status   Specimen Description WOUND ULCER  Final   Special Requests NONE  Final   Gram Stain   Final    MODERATE WBC PRESENT,  PREDOMINANTLY PMN FEW GRAM POSITIVE COCCI RARE GRAM NEGATIVE RODS    Culture MULTIPLE ORGANISMS PRESENT, NONE PREDOMINANT (A)  Final  Report Status 05/19/2017 FINAL  Final  MRSA PCR Screening     Status: None   Collection Time: 05/16/17 10:11 PM  Result Value Ref Range Status   MRSA by PCR NEGATIVE NEGATIVE Final    Comment:        The GeneXpert MRSA Assay (FDA approved for NASAL specimens only), is one component of a comprehensive MRSA colonization surveillance program. It is not intended to diagnose MRSA infection nor to guide or monitor treatment for MRSA infections.   Surgical pcr screen     Status: None   Collection Time: 05/17/17  9:57 PM  Result Value Ref Range Status   MRSA, PCR NEGATIVE NEGATIVE Final   Staphylococcus aureus NEGATIVE NEGATIVE Final    Comment:        The Xpert SA Assay (FDA approved for NASAL specimens in patients over 80 years of age), is one component of a comprehensive surveillance program.  Test performance has been validated by [facility] for patients greater than or equal to 52 year old. It is not intended to diagnose infection nor to guide or monitor treatment.   Culture, blood (routine x 2)     Status: None (Preliminary result)   Collection Time: 05/19/17 10:10 PM  Result Value Ref Range Status   Specimen Description BLOOD RIGHT ARM  Final   Special Requests   Final    BOTTLES DRAWN AEROBIC AND ANAEROBIC Blood Culture adequate volume   Culture NO GROWTH 2 DAYS  Final   Report Status PENDING  Incomplete  Culture, blood (routine x 2)     Status: None (Preliminary result)   Collection Time: 05/19/17 10:17 PM  Result Value Ref Range Status   Specimen Description BLOOD RIGHT HAND  Final   Special Requests   Final    BOTTLES DRAWN AEROBIC AND ANAEROBIC Blood Culture adequate volume   Culture NO GROWTH 2 DAYS  Final   Report Status PENDING  Incomplete    Studies/Results: Dg Chest 2 View  Result Date: 05/21/2017 CLINICAL DATA:   Peripheral vascular disease, diabetic neuropathy, fever, recent amputation LEFT foot, hypertension, cardiomyopathy EXAM: CHEST  2 VIEW COMPARISON:  05/18/2017 FINDINGS: Enlargement of cardiac silhouette. Atherosclerotic calcification aorta. Pulmonary vascular congestion. Lungs clear. No pleural effusion or pneumothorax. Bones unremarkable. IMPRESSION: Enlargement of cardiac silhouette with pulmonary vascular congestion. Aortic atherosclerosis. Electronically Signed   By: Lavonia Dana M.D.   On: 05/21/2017 12:17   Dg Foot Complete Left  Result Date: 05/21/2017 CLINICAL DATA:  Peripheral vascular disease, diabetic neuropathy, LEFT toe amputation, irrigation, debridement, fever, history hypertension, cardiomyopathy EXAM: LEFT FOOT - COMPLETE 3+ VIEW COMPARISON:  05/16/2017 FINDINGS: Interval amputation of the fifth ray soft tissue swelling, skin clips and overlying dressing artifacts. Prior amputation of fourth toe. Diffuse osseous demineralization. Old healed fracture of the proximal phalanx great toe. No acute fracture or dislocation. Cortical margination of the lateral base of the fourth metatarsal and the lateral margin of the cuboid appears indistinct, could be related to surgery but cannot exclude bone destruction / osteomyelitis. Scattered small vessel vascular calcifications. IMPRESSION: Interval amputation of the fifth ray. Question cortical loss at the lateral base of the fourth metatarsal at the lateral margin of the cuboid, question related to preceding surgery but unable to exclude subtle bone destruction/osteomyelitis. Electronically Signed   By: Lavonia Dana M.D.   On: 05/21/2017 12:16      Assessment/Plan:  INTERVAL HISTORY:  Fever curve continues to be better temperature max overnight 101.2   Principal  Problem:   Osteomyelitis of left foot (HCC) Active Problems:   Anemia due to end stage renal disease (HCC)   Cardiomegaly   Benign essential HTN   Congestive dilated cardiomyopathy  (HCC)   ESRD on dialysis ([place])   Peripheral arterial disease ([place])   ESRD on hemodialysis ([place])   Diabetes mellitus with complication ([place])    [name] Sages Sr. is a 66 y.o. male with  fevers, worsening left foot ulcer with osteo s/p amputation but now still having ongoing fevers, concern for FUO  #1 FUO:   Due to fevers not completely calming down yet and patients desire to get clarity more rapidly and be DC to home I will go ahead and get MRI of his foot.   Case d/w Dr. [name] and we agree there may undoubtedly be an overcall on read given recent surgery  I will check dopplers UE and LE  #2 Osteomyelitis involving left foot status post fifth ray amputation and partial cuboid amputation continue vancomycin  Concern is for residual infection that may not just require further antibiotics but might need further surgery. Again that's reason for the MRI above. I would like to put him on a regimen that'll be easier to take with dialysis on placing him back on vancomycin was ceftaz edema but placing him on oral metronidazole so that will have some anaerobic coverage as part of this.  I spent greater than 35  minutes with the patient including greater than 50% of time in face to face counsel of the patient re his osteomyelitis, FUO  and in coordination of his care.   Dr. Baxter Flattery is back tomorrow        LOS: 6 days   [name] 05/22/2017, 2:07 PM

## 2017-05-22 NOTE — Progress Notes (Signed)
Pharmacy Antibiotic Note  [name]. is a 66 y.o. male admitted on 05/16/2017 with osteomyelitis.  Pharmacy has been consulted for vancomycin and ceftazidime dosing. Previously on ceftazidime but switched to meropenem for spiking fevers. ID changing back to ceftaz and added PO Flagyl. Patient has ESRD on HD-MWF.  Plan: D/c meropenem Ceftazidime 1g IV x1 today at 1800 Ceftazidime 2g IV qHD-MWF at 1800 Vancomycin 1g IV qHD-MWF F/u HD schedule/tolerability ID recommending treating x 4wks  Height: 6\' 1"  (185.4 cm) Weight: 203 lb 12.8 oz (92.4 kg) IBW/kg (Calculated) : 79.9  Temp (24hrs), Avg:99.7 F (37.6 C), Min:98.7 F (37.1 C), Max:101.2 F (38.4 C)   Recent Labs Lab 05/16/17 1319 05/16/17 1422 05/16/17 1550 05/17/17 0805 05/18/17 0714 05/19/17 0413 05/20/17 0815  WBC  --  11.2*  --  7.2 9.0 8.7 10.3  CREATININE  --  13.82*  --  9.83* 11.94* 9.76* 13.21*  LATICACIDVEN 1.47  --  0.59  --   --   --   --     Estimated Creatinine Clearance: 6.3 mL/min (A) (by C-G formula based on SCr of 13.21 mg/dL (H)).    Allergies  Allergen Reactions  . Penicillins Hives and Swelling    Ankle swelling  PATIENT HAD A PCN REACTION WITH IMMEDIATE RASH, FACIAL/TONGUE/THROAT SWELLING, SOB, OR LIGHTHEADEDNESS WITH HYPOTENSION:  #  #  #  YES  #  #  #   Has patient had a PCN reaction causing severe rash involving mucus membranes or skin necrosis: no Has patient had a PCN reaction that required hospitalization: no Has patient had a PCN reaction occurring within the last 10 years: no If all of the above answers are "NO", then may proceed with Cephalosporin use.    Antimicrobials this admission: 5/21 vanc>> 5/21 ceftazidime>>5/25; 5/27>> 5/25 meropenem>>5/27 5/27 Flagyl>>  Microbiology results: 5/15 WCx: multiple organisms, moderate bacteroides 5/21 WCx: multiple organisms 5/22 MRSA PCR: neg 5/24 BCx: ngtd   Gwenlyn Perking, PharmD PGY1 Pharmacy Resident [phone] 05/22/2017 11:02  AM

## 2017-05-22 NOTE — Progress Notes (Signed)
[facility] Progress Note   Subjective:    Had sweat  about 630 am - had to get out of bed.  Objective Vitals:   05/21/17 1859 05/21/17 2028 05/21/17 2330 05/22/17 0427  BP: (!) 150/54 (!) 145/56  131/60  Pulse: 86 83  71  Resp: 17 17  16   Temp: 100.2 F (37.9 C) (!) 101.2 F (38.4 C) 99.8 F (37.7 C) 98.7 F (37.1 C)  TempSrc: Oral Oral Oral Oral  SpO2: 96% 100%  100%  Weight:  92.4 kg (203 lb 12.8 oz)    Height:       Physical Exam General: NAD sitting in chair Heart: RRR Lungs: no rales Abdomen: soft NT Extremities: left foot wrapped - tr LE edema Dialysis Access:  Left AVF + bruit   Recent Labs Lab 05/18/17 0714 05/19/17 0413 05/20/17 0815  NA 131* 134* 132*  K 4.5 4.9 4.4  CL 90* 94* 95*  CO2 24 26 21*  GLUCOSE 156* 156* 113*  BUN 48* 33* 51*  CREATININE 11.94* 9.76* 13.21*  CALCIUM 8.9 8.0* 7.9*  PHOS 6.9* 4.9* 5.5*    Recent Labs Lab 05/17/17 0805 05/18/17 0714 05/19/17 0413 05/20/17 0815  AST 13*  --   --   --   ALT 9*  --   --   --   ALKPHOS 71  --   --   --   BILITOT 0.7  --   --   --   PROT 8.0  --   --   --   ALBUMIN 3.2* 2.9* 2.9* 2.8*    Recent Labs Lab 05/16/17 1422 05/17/17 0805 05/18/17 0714 05/19/17 0413 05/20/17 0815  WBC 11.2* 7.2 9.0 8.7 10.3  NEUTROABS 7.7  --   --   --   --   HGB 12.4* 12.9* 12.5* 12.3* 12.1*  HCT 37.8* 40.7 38.0* 39.1 37.3*  MCV 94.0 96.0 93.1 96.3 93.5  PLT 268 270 291 258 251   Blood Culture    Component Value Date/Time   SDES BLOOD RIGHT HAND 05/19/2017 2217   SPECREQUEST  05/19/2017 2217    BOTTLES DRAWN AEROBIC AND ANAEROBIC Blood Culture adequate volume   CULT NO GROWTH 1 DAY 05/19/2017 2217   REPTSTATUS PENDING 05/19/2017 2217     Recent Labs Lab 05/21/17 0825 05/21/17 1213 05/21/17 1544 05/21/17 2033 05/22/17 0824  GLUCAP 132* 162* 115* 146* 146*    Dg Chest 2 View  Result Date: 05/21/2017 CLINICAL DATA:  Peripheral vascular disease, diabetic neuropathy,  fever, recent amputation LEFT foot, hypertension, cardiomyopathy EXAM: CHEST  2 VIEW COMPARISON:  05/18/2017 FINDINGS: Enlargement of cardiac silhouette. Atherosclerotic calcification aorta. Pulmonary vascular congestion. Lungs clear. No pleural effusion or pneumothorax. Bones unremarkable. IMPRESSION: Enlargement of cardiac silhouette with pulmonary vascular congestion. Aortic atherosclerosis. Electronically Signed   By: [place] Dana M.D.   On: 05/21/2017 12:17   Dg Foot Complete Left  Result Date: 05/21/2017 CLINICAL DATA:  Peripheral vascular disease, diabetic neuropathy, LEFT toe amputation, irrigation, debridement, fever, history hypertension, cardiomyopathy EXAM: LEFT FOOT - COMPLETE 3+ VIEW COMPARISON:  05/16/2017 FINDINGS: Interval amputation of the fifth ray soft tissue swelling, skin clips and overlying dressing artifacts. Prior amputation of fourth toe. Diffuse osseous demineralization. Old healed fracture of the proximal phalanx great toe. No acute fracture or dislocation. Cortical margination of the lateral base of the fourth metatarsal and the lateral margin of the cuboid appears indistinct, could be related to surgery but cannot exclude bone destruction / osteomyelitis.  Scattered small vessel vascular calcifications. IMPRESSION: Interval amputation of the fifth ray. Question cortical loss at the lateral base of the fourth metatarsal at the lateral margin of the cuboid, question related to preceding surgery but unable to exclude subtle bone destruction/osteomyelitis. Electronically Signed   By: [place] Dana M.D.   On: 05/21/2017 12:16   Medications:  . sodium chloride    . meropenem (MERREM) IV Stopped (05/21/17 1855)  . vancomycin Stopped (05/20/17 1228)   . doxercalciferol  6 mcg Intravenous Q M,W,F-HD  . ferric citrate  210 mg Oral TID WC  . insulin aspart  0-5 Units Subcutaneous QHS  . insulin aspart  0-9 Units Subcutaneous TID WC  . multivitamin  1 tablet Oral QHS  . senna-docusate   1 tablet Oral BID   Dialysis Orders:  MWF [facility] 4.25 hours 425/800 EDW 92.5 kg will be lower at discharge 2K2.25 Ca L AVF Hectorol 4 mcg Heparin 6000 units (sensipar 60 2/day at home; bider auryxia 2 tid w/meals)  Background: 66 y.o.year-old AAM w/ESRD, DM2, HTN, NICM with sCHF. Has had issues with foot ulcers with osteo of R foot earlier this year treated with V/F/hyperbaric O2. Current admission for ulceration lateral aspect LEFT foot with associated chills, temps to 102, foot pain/drainage/foul odor. Plain film with lucency base of L 5th metatarsal c/w osteo. Admitted for IV ATB's. Had L 5th ray amputation 05/18/17.  Assessment/Plan: 1. Osteomyelitis L fifth metatarsal-MRI 5/22 - osteo no abscess. Janna Arch cx+ 5/21 Corynebacterium; s/p 5th ray amp 5/23Persistent fever spikes. Fever spike yesterday to 102.6 WBC 10.3 - Pt had repeat [facility] 5/24 pending; Fortaz changed to meropenem for broader coverage 5/25 due to fever;  Suspect sweats this am due to fever breaking; CXR neg infiltrate, foot xray ? Osteo - ID rec MRI if fevers persist. 2. ESRD- MWF  No heparin. Under EDW. BP up more today Kept even Friday - next HD Monday- CXR pul vasc congestion; set goal 2 L for Monday; resume tight heparin 3. Secondary HPT- hectorol with HD. Sensipar 120/day. [name]. 2ac; Ca and P  improved in controlled environment- 4. DM- insulin per primary team 5. Anemia 2/2 ESRD- Hgb 12.1no current ESA requirement 6. HTN/Volume - no BP meds. Under EDW BP ok; suspect some of lower BP due to fever. 7. PAD - has had prior evaluation for large vessel ds (Dr. Donnetta Hutching) and felt adequate flow to heal amputations  8. Persistent fevers - work up per #1  [name], PA-C [place] [phone] 05/22/2017,9:29 AM  LOS: 6 days   I have seen and examined this patient and agree with plan and assessment in the above note with renal recommendations/intervention highlighted. Persistent temp  spikes. For MRI. Vanco/meropenem. HD Monday - remove volume.   Steffan Caniglia B,MD 05/22/2017 12:45 PM

## 2017-05-23 ENCOUNTER — Inpatient Hospital Stay (HOSPITAL_COMMUNITY): Payer: Medicare Other

## 2017-05-23 LAB — CBC
HCT: 35.7 % — ABNORMAL LOW (ref 39.0–52.0)
Hemoglobin: 11.7 g/dL — ABNORMAL LOW (ref 13.0–17.0)
MCH: 30.2 pg (ref 26.0–34.0)
MCHC: 32.8 g/dL (ref 30.0–36.0)
MCV: 92.2 fL (ref 78.0–100.0)
Platelets: 355 10*3/uL (ref 150–400)
RBC: 3.87 MIL/uL — ABNORMAL LOW (ref 4.22–5.81)
RDW: 15.7 % — ABNORMAL HIGH (ref 11.5–15.5)
WBC: 9.1 10*3/uL (ref 4.0–10.5)

## 2017-05-23 LAB — RENAL FUNCTION PANEL
Albumin: 2.5 g/dL — ABNORMAL LOW (ref 3.5–5.0)
Anion gap: 16 — ABNORMAL HIGH (ref 5–15)
BUN: 61 mg/dL — ABNORMAL HIGH (ref 6–20)
CO2: 23 mmol/L (ref 22–32)
Calcium: 8.7 mg/dL — ABNORMAL LOW (ref 8.9–10.3)
Chloride: 96 mmol/L — ABNORMAL LOW (ref 101–111)
Creatinine, Ser: 14.08 mg/dL — ABNORMAL HIGH (ref 0.61–1.24)
GFR calc Af Amer: 4 mL/min — ABNORMAL LOW (ref >60)
GFR calc non Af Amer: 3 mL/min — ABNORMAL LOW (ref >60)
Glucose, Bld: 148 mg/dL — ABNORMAL HIGH (ref 65–99)
Phosphorus: 7.2 mg/dL — ABNORMAL HIGH (ref 2.5–4.6)
Potassium: 4.5 mmol/L (ref 3.5–5.1)
Sodium: 135 mmol/L (ref 135–145)

## 2017-05-23 LAB — GLUCOSE, CAPILLARY
GLUCOSE-CAPILLARY: 183 mg/dL — AB (ref 65–99)
GLUCOSE-CAPILLARY: 147 mg/dL — AB (ref 65–99)
Glucose-Capillary: 176 mg/dL — ABNORMAL HIGH (ref 65–99)

## 2017-05-23 LAB — HCV COMMENT:

## 2017-05-23 LAB — HEPATITIS C ANTIBODY (REFLEX): HCV AB: 0.2 {s_co_ratio} (ref 0.0–0.9)

## 2017-05-23 MED ORDER — HEPARIN SODIUM (PORCINE) 1000 UNIT/ML DIALYSIS: 1000.0000 [IU] | INTRAMUSCULAR | Status: DC | PRN

## 2017-05-23 MED ORDER — ALTEPLASE 2 MG IJ SOLR: 2.0000 mg | Freq: Once | INTRAMUSCULAR | Status: DC | PRN

## 2017-05-23 MED ORDER — VANCOMYCIN HCL IN DEXTROSE 1-5 GM/200ML-% IV SOLN
INTRAVENOUS | Status: AC
  Filled 2017-05-23: qty 200

## 2017-05-23 MED ORDER — SODIUM CHLORIDE 0.9 % IV SOLN: 100.0000 mL | INTRAVENOUS | Status: DC | PRN

## 2017-05-23 MED ORDER — LIDOCAINE HCL (PF) 1 % IJ SOLN
5.0000 mL | INTRAMUSCULAR | Status: DC | PRN
  Filled 2017-05-23: qty 5

## 2017-05-23 MED ORDER — DOXERCALCIFEROL 4 MCG/2ML IV SOLN
INTRAVENOUS | Status: AC
  Filled 2017-05-23: qty 4

## 2017-05-23 MED ORDER — LIDOCAINE-PRILOCAINE 2.5-2.5 % EX CREA: 1.0000 "application " | TOPICAL_CREAM | CUTANEOUS | Status: DC | PRN

## 2017-05-23 MED ORDER — HEPARIN SODIUM (PORCINE) 1000 UNIT/ML DIALYSIS: 20.0000 [IU]/kg | INTRAMUSCULAR | Status: DC | PRN

## 2017-05-23 MED ORDER — PENTAFLUOROPROP-TETRAFLUOROETH EX AERO: 1.0000 "application " | INHALATION_SPRAY | CUTANEOUS | Status: DC | PRN

## 2017-05-23 NOTE — Progress Notes (Signed)
[facility]                                                                              Patient Demographics  [name], is a 66 y.o. male, DOB - 04/25/1951, WGY:[id number]  Admit date - 05/16/2017   Admitting Physician Waldemar Dickens, MD  Outpatient Primary MD for the patient is [name], MD  Outpatient specialists:   LOS - 7  days    Chief Complaint  Patient presents with  . Wound Check  . Fever       Brief summary   Patient is a 66 year old male with diabetes, ESRD, MWF, CAD, anemia, hypertension, systolic CHF presented to ED from ID clinic with worsening left foot pain and increasing drainage from a nonhealing foot ulcers. Per patient he had developed ulcers from the lateral aspect of his left foot after having his fourth toe amputated. For the last 3 months he had received vancomycin and cefazolin at dialysis and was recently transitioned to doxycycline in the ID clinic. In ED, he had a temp of 101.18F.   Assessment & Plan    Principal Problem:   Osteomyelitis of left foot (HCC) With nonhealing left foot ulcers possibly due to diabetic foot ulcers/Diabetes mellitus and underlying PAD - Patient has a history of osteomyelitis in his right foot. Patient received antibiotics via hemodialysis for several weeks prior to admission and was recently transitioned to oral doxycycline. - MRI of the left foot showed soft tissue ulcer overlying the base of the fifth metatarsal osteomyelitis of the base of the fifth metatarsal along the lateral margin, no abscess. - Patient underwent left fifth ray amputation and excision on 5/23 - Appreciate ID recommendations, continue vancomycin, added Fortaz and Flagyl. - Blood cultures, wound cultures growing diphtheroids. - Follow MRI of the left foot. 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  - Started on primidone, per his renal function, appreciate pharmacy to adjust his dose  Code Status: Full CODE STATUS  DVT Prophylaxis:Heparin  Family  Communication: Discussed in detail with the patient, all imaging results, lab results explained to the patient   Disposition Plan:   Time Spent in minutes   25 minutes  Procedures:  Hemodialysis   PROCEDURES: 05/18/17 1. A 5th ray amputation. 2. Partial excision of the cuboid.  Consultants:   Orthopedics   infectious disease Nephrology  Antimicrobials:   IV vancomycin with hemodialysis 5/21 >>   IV Fortaz with hemodialysis  5/21 > 5/25 restarted again 5/27 >>  Meropenem 5/25 > 5/27  Flagyl 5/27 >>   Medications  Scheduled Meds: . doxercalciferol  6  mcg Intravenous Q M,W,F-HD  . ferric citrate  210 mg Oral TID WC  . insulin aspart  0-5 Units Subcutaneous QHS  . insulin aspart  0-9 Units Subcutaneous TID WC  . metroNIDAZOLE  500 mg Oral Q8H  . multivitamin  1 tablet Oral QHS  . primidone  25 mg Oral QHS  . senna-docusate  1 tablet Oral BID   Continuous Infusions: . sodium chloride    . sodium chloride    . sodium chloride    . cefTAZidime (FORTAZ)  IV    . vancomycin 1,000 mg (05/23/17 1052)   PRN Meds:.sodium chloride, sodium chloride, acetaminophen **OR** acetaminophen, alteplase, heparin, heparin, HYDROcodone-acetaminophen, lidocaine (PF), lidocaine-prilocaine, ondansetron **OR** ondansetron (ZOFRAN) IV, pentafluoroprop-tetrafluoroeth, polyethylene glycol   Antibiotics   Anti-infectives    Start     Dose/Rate Route Frequency Ordered Stop     05/23/17 1800  cefTAZidime (FORTAZ) 2 g in dextrose 5 % 50 mL IVPB     2 g 100 mL/hr over 30 Minutes Intravenous Every M-W-F (1800) 05/22/17 1056     05/23/17 0821  vancomycin (VANCOCIN) 1-5 GM/200ML-% IVPB    Comments:  Cherylann Banas   : cabinet override      05/23/17 0821 05/23/17 0944   05/22/17 1800  cefTAZidime (FORTAZ) 1 g in dextrose 5 % 50 mL IVPB     1 g 100 mL/hr over 30 Minutes Intravenous  Once 05/22/17 1056 05/22/17 1915   05/22/17 1400  metroNIDAZOLE (FLAGYL) tablet 500 mg     500 mg Oral Every 8 hours 05/22/17 1028     05/21/17 1800  meropenem (MERREM) 500 mg in sodium chloride 0.9 % 50 mL IVPB  Status:  Discontinued     500 mg 100 mL/hr over 30 Minutes Intravenous Every 24 hours 05/20/17 1334 05/22/17 1028   05/20/17 1400  meropenem (MERREM) 500 mg in sodium chloride 0.9 % 50 mL IVPB  Status:  Discontinued     500 mg 100 mL/hr over 30 Minutes Intravenous Every 24 hours 05/20/17 1332 05/20/17 1334   05/20/17 1345  meropenem (MERREM) 500 mg in sodium chloride 0.9 % 50 mL IVPB     500 mg 100 mL/hr over 30 Minutes Intravenous  Once 05/20/17 1334 05/20/17 1449   05/18/17 0600  vancomycin (VANCOCIN) IVPB 1000 mg/200 mL premix  Status:  Discontinued     1,000 mg 200 mL/hr over 60 Minutes Intravenous On call to O.R. 05/17/17 2338 05/18/17 1113   05/16/17 2200  vancomycin (VANCOCIN) IVPB 1000 mg/200 mL premix     1,000 mg 200 mL/hr over 60 Minutes Intravenous  Once 05/16/17 2002 05/16/17 2214   05/16/17 2100  vancomycin (VANCOCIN) 2,000 mg in sodium chloride 0.9 % 500 mL IVPB  Status:  Discontinued     2,000 mg 250 mL/hr over 120 Minutes Intravenous  Once 05/16/17 1809 05/16/17 2000   05/16/17 2100  vancomycin (VANCOCIN) IVPB 1000 mg/200 mL premix     1,000 mg 200 mL/hr over 60 Minutes Intravenous  Once 05/16/17 2000 05/16/17 2030   05/16/17 2000  cefTAZidime (FORTAZ) 2 g in dextrose 5 % 50 mL IVPB  Status:  Discontinued     2 g 100 mL/hr over 30 Minutes Intravenous Every  M-W-F (2000) 05/16/17 1610 05/20/17 1322   05/16/17 2000  vancomycin (VANCOCIN) IVPB 1000 mg/200 mL premix  Status:  Discontinued     1,000 mg 200 mL/hr over 60 Minutes Intravenous  Once 05/16/17 1633 05/16/17 1809   05/16/17 1615  vancomycin (VANCOCIN) IVPB 1000 mg/200 mL premix     1,000 mg 200 mL/hr over 60 Minutes Intravenous Every M-W-F (Hemodialysis) 05/16/17 1603     05/16/17 1600  vancomycin (VANCOCIN) 2,000 mg in sodium chloride 0.9 % 500 mL IVPB  Status:  Discontinued     2,000 mg 250 mL/hr over 120 Minutes Intravenous  Once 05/16/17 1603 05/16/17 1609        Subjective:   Boluwatife Flight was seen and examined today. Patient seen during hemodialysis, afebrile, feeling overall improving. Patient denies chest pain, shortness of breath, abdominal pain, N/V, new weakness, numbess, tingling.  Objective:   Vitals:   05/23/17 0930 05/23/17 1000 05/23/17 1030 05/23/17 1100  BP: (!) 144/70 115/60 110/62 (!) 106/56  Pulse: 75 74 70 72  Resp:      Temp:      TempSrc:      SpO2:      Weight:      Height:        Intake/Output Summary (Last 24 hours) at 05/23/17 1120 Last data filed at 05/23/17 0600  Gross per 24 hour  Intake              480 ml  Output                0 ml  Net              480 ml     Wt Readings from Last 3 Encounters:  05/23/17 92 kg (202 lb 13.2 oz)  05/16/17 96.2 kg (212 lb)  03/07/17 95.7 kg (211 lb)     Exam  General: Alert and oriented 3 NAD  HEENT: PERRLA, EOMI  Neck: No JVD   Cardiovascular:S1S2 clear, RRR  Respiratory: CTA B  Gastrointestinal: Soft, NT, ND, NBS  Ext: left foot dressing intact  neuro: No new deficits  Skin:  Left foot dressing intact  psyc: Normal affect and demeanor, alert and oriented x3    Data Reviewed:  I have personally reviewed following labs and imaging studies  Micro Results Recent Results (from the past 240 hour(s))  Wound or Superficial Culture     Status: Abnormal   Collection Time: 05/16/17   2:26 PM  Result Value Ref Range Status   Specimen Description WOUND ULCER  Final   Special Requests NONE  Final   Gram Stain   Final    MODERATE WBC PRESENT, PREDOMINANTLY PMN FEW GRAM POSITIVE COCCI RARE GRAM NEGATIVE RODS    Culture MULTIPLE ORGANISMS PRESENT, NONE PREDOMINANT (A)  Final   Report Status 05/19/2017 FINAL  Final  MRSA PCR Screening     Status: None   Collection Time: 05/16/17 10:11 PM  Result Value Ref Range Status   MRSA by PCR NEGATIVE NEGATIVE Final    Comment:        The GeneXpert MRSA Assay (FDA approved for NASAL specimens only), is one component of a comprehensive MRSA colonization surveillance program. It is not intended to diagnose MRSA infection nor to guide or monitor treatment for MRSA infections.   Surgical pcr screen     Status: None   Collection Time: 05/17/17  9:57 PM  Result Value Ref Range Status   MRSA, PCR NEGATIVE NEGATIVE Final   Staphylococcus aureus NEGATIVE NEGATIVE Final    Comment:        The Xpert SA Assay (FDA approved for NASAL specimens in patients over 60 years of age), is one component of a comprehensive surveillance program.  Test performance has been validated by [facility] for patients greater than or equal to 27 year old. It is not intended to diagnose infection nor to guide or monitor treatment.   Culture, blood (routine x 2)     Status: None (Preliminary result)   Collection Time: 05/19/17 10:10 PM  Result Value Ref Range Status   Specimen Description BLOOD RIGHT ARM  Final   Special Requests   Final    BOTTLES DRAWN AEROBIC AND ANAEROBIC Blood Culture adequate volume   Culture NO GROWTH 3 DAYS  Final   Report Status PENDING  Incomplete  Culture, blood (routine x 2)     Status: None (Preliminary result)   Collection Time: 05/19/17 10:17 PM  Result Value Ref Range Status   Specimen Description BLOOD RIGHT HAND  Final   Special Requests   Final    BOTTLES DRAWN AEROBIC AND ANAEROBIC Blood Culture adequate  volume   Culture NO GROWTH 3 DAYS  Final   Report Status PENDING  Incomplete    Radiology Reports Dg Chest 2 View  Result Date: 05/21/2017 CLINICAL DATA:  Peripheral vascular disease, diabetic neuropathy, fever, recent amputation LEFT foot, hypertension, cardiomyopathy EXAM: CHEST  2 VIEW COMPARISON:  05/18/2017 FINDINGS: Enlargement of cardiac silhouette. Atherosclerotic calcification aorta. Pulmonary vascular congestion. Lungs clear. No pleural effusion or pneumothorax. Bones unremarkable. IMPRESSION: Enlargement of cardiac silhouette with pulmonary vascular congestion. Aortic atherosclerosis. Electronically Signed   By: Lavonia Dana M.D.   On: 05/21/2017 12:17   Mr Foot Left Wo Contrast  Result Date: 05/17/2017 CLINICAL DATA:  Nonhealing ulcer involving the left foot. EXAM: MRI OF THE LEFT FOOT WITHOUT CONTRAST TECHNIQUE: Multiplanar, multisequence MR imaging of the left forefoot was performed. No intravenous contrast was administered. COMPARISON:  None. FINDINGS: Bones/Joint/Cartilage Soft tissue ulcer overlying the base of the fifth metatarsal. Ulcer extends to the cortex of the base of the fifth metatarsal along the lateral margin with cortical irregularity, underlying bone marrow edema and T1 hypointensity most consistent with osteomyelitis. Amputation of the fourth phalanx. No acute fracture or dislocation. Normal alignment. No joint effusion. Mild osteoarthritis of the first MTP joint. Mild osteoarthritis of the second MTP joint. Mild osteoarthritis of the navicular- medial cuneiform joint. Ligaments Collateral ligaments are intact.  Lisfranc ligament is intact. Muscles and Tendons Flexor, peroneal and extensor compartment tendons are intact. Generalized muscle atrophy. Soft tissue No fluid collection or hematoma. No soft tissue mass. Soft tissue edema along the lateral aspect of the foot overlying the fifth metatarsal consistent with cellulitis. IMPRESSION: 1. Soft tissue ulcer overlying the  base of the fifth metatarsal. 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 No drainable fluid collection to suggest an abscess. Electronically Signed   By: Kathreen Devoid   On: 05/17/2017 08:15   Dg Chest Port 1 View  Result Date: 05/18/2017 CLINICAL DATA:  Shortness of Breath EXAM: PORTABLE CHEST 1 VIEW COMPARISON:  09/11/2016 FINDINGS: Cardiac shadow is mildly enlarged. The lungs are well aerated bilaterally. No focal infiltrate or sizable effusion is noted. No bony abnormality is noted IMPRESSION: No active disease. Electronically Signed   By: Inez Catalina M.D.   On: 05/18/2017 07:58   Dg Foot Complete Left  Result Date: 05/21/2017 CLINICAL DATA:  Peripheral vascular disease, diabetic neuropathy, LEFT toe amputation, irrigation, debridement, fever, history hypertension, cardiomyopathy EXAM: LEFT FOOT - COMPLETE 3+ VIEW COMPARISON:  05/16/2017 FINDINGS: Interval amputation of the fifth ray soft tissue swelling, skin clips and overlying dressing  artifacts. Prior amputation of fourth toe. Diffuse osseous demineralization. Old healed fracture of the proximal phalanx great toe. No acute fracture or dislocation. Cortical margination of the lateral base of the fourth metatarsal and the lateral margin of the cuboid appears indistinct, could be related to surgery but cannot exclude bone destruction / osteomyelitis. Scattered small vessel vascular calcifications. IMPRESSION: Interval amputation of the fifth ray. Question cortical loss at the lateral base of the fourth metatarsal at the lateral margin of the cuboid, question related to preceding surgery but unable to exclude subtle bone destruction/osteomyelitis. Electronically Signed   By: Lavonia Dana M.D.   On: 05/21/2017 12:16   Dg Foot Complete Left  Result Date: 05/16/2017 CLINICAL DATA:  Left foot soft tissue wound with drainage and pain and fevers, initial encounter EXAM: LEFT FOOT - COMPLETE 3+ VIEW COMPARISON:  05/03/2017  FINDINGS: Degenerative changes at first MTP joint are noted. Soft tissue defect is again noted at the distal aspect of the fifth toe as well as laterally in the foot. There are some erosive changes identified at the base of the fifth metatarsal which are new from the prior exam consistent with osteomyelitis. Chronic changes in the heads of the second and fourth metatarsals are seen. Changes of prior fourth toe amputation are again noted. IMPRESSION: New area of lucency at the base of the fifth metatarsal. This is consistent with underlying osteomyelitis. Electronically Signed   By: Inez Catalina M.D.   On: 05/16/2017 14:14   Dg Foot Complete Left  Result Date: 05/03/2017 CLINICAL DATA:  66 year old male with chronic left foot wounds EXAM: LEFT FOOT - COMPLETE 3+ VIEW COMPARISON:  Prior radiographs of the left foot 01/04/2017 FINDINGS: Stable postsurgical changes of prior fourth digit amputation. Sequelae of prior Freiberg's infraction at the head of the second metatarsal are similar compared to prior. No evidence of new bony refraction or due destruction to suggest acute osteomyelitis. Soft tissue irregularity present at the tip of the fifth toe, and laterally adjacent to the base of the fifth metatarsal consistent with ulcerations. Calcifications are present of the digital arteries bilaterally. Degenerative osteoarthritis present in the great toe MTP joint. IMPRESSION: 1. No convincing conventional radiographic evidence of active osteomyelitis. 2. Active soft tissue ulceration at the tip of the fifth digit, and at the lateral aspect of the foot adjacent the base of the fifth metatarsal. 3. Surgical changes of prior fourth toe amputation. 4. 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 5. Small vessel atherosclerotic vascular calcifications. 6. 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 Electronically Signed   By: Jacqulynn Cadet M.D.   On: 05/03/2017 14:23    Lab Data:  CBC:  Recent Labs Lab 05/16/17 1422 05/17/17 0805 05/18/17 3532 05/19/17 0413 05/20/17 0815 05/23/17 0743  WBC 11.2* 7.2 9.0 8.7 10.3 9.1  NEUTROABS 7.7  --   --   --   --   --   HGB 12.4* 12.9* 12.5* 12.3* 12.1* 11.7*  HCT 37.8* 40.7 38.0* 39.1 37.3* 35.7*  MCV 94.0 96.0 93.1 96.3 93.5 92.2  PLT 268 270 291 258 251 992   Basic Metabolic Panel:  Recent Labs Lab 05/17/17 0805 05/18/17 0714 05/19/17 0413 05/20/17 0815 05/23/17 0743  NA 136 131* 134* 132* 135  K 4.4 4.5 4.9 4.4 4.5  CL 93* 90* 94* 95* 96*  CO2 29 24 26  21* 23  GLUCOSE 152* 156* 156* 113* 148*  BUN 33* 48* 33* 51* 61*  CREATININE 9.83* 11.94* 9.76* 13.21* 14.08*  CALCIUM 9.5 8.9 8.0* 7.9* 8.7*  PHOS  --  6.9* 4.9* 5.5* 7.2*   GFR: Estimated Creatinine Clearance: 5.9 mL/min (A) (by C-G formula based on SCr of 14.08 mg/dL (H)). Liver Function Tests:  Recent Labs Lab 05/17/17 0805 05/18/17 6568 05/19/17 0413 05/20/17 0815 05/23/17 0743  AST 13*  --   --   --   --   ALT 9*  --   --   --   --   ALKPHOS 71  --   --   --   --   BILITOT 0.7  --   --   --   --   PROT 8.0  --   --   --   --   ALBUMIN 3.2* 2.9* 2.9* 2.8* 2.5*   No results for input(s): LIPASE, AMYLASE in the last 168 hours. No results for input(s): AMMONIA in the last 168 hours. Coagulation Profile:  Recent Labs Lab 05/17/17 0805  INR 1.19   Cardiac Enzymes: No results for input(s): CKTOTAL, CKMB, CKMBINDEX, TROPONINI in the last 168 hours. BNP (last 3 results) No results for input(s): PROBNP in the last 8760 hours. HbA1C: No results for input(s): HGBA1C in the last 72 hours. CBG:  Recent Labs Lab 05/21/17 2033 05/22/17 0824 05/22/17 1248 05/22/17 1803 05/22/17 2217  GLUCAP 146* 146* 167* 121* 156*   Lipid Profile: No results for input(s): CHOL, HDL, LDLCALC, TRIG, CHOLHDL, LDLDIRECT in the last 72 hours. Thyroid Function Tests: No results for input(s): TSH,  T4TOTAL, FREET4, T3FREE, THYROIDAB in the last 72 hours. Anemia Panel: No results for input(s): VITAMINB12, FOLATE, FERRITIN, TIBC, IRON, RETICCTPCT in the last 72 hours. Urine analysis:    Component Value Date/Time   COLORURINE YELLOW 09/11/2016 1933   APPEARANCEUR CLEAR 09/11/2016 1933   LABSPEC 1.015 09/11/2016 1933   PHURINE 8.5 (H) 09/11/2016 1933   GLUCOSEU 100 (A) 09/11/2016 1933   HGBUR NEGATIVE 09/11/2016 1933   BILIRUBINUR SMALL (A) 09/11/2016 1933   KETONESUR NEGATIVE 09/11/2016 1933   PROTEINUR 100 (A) 09/11/2016 1933   UROBILINOGEN 0.2 11/22/2014 1740   NITRITE NEGATIVE 09/11/2016 1933   LEUKOCYTESUR NEGATIVE 09/11/2016 1933     Harrol Novello M.D. [facility] 05/23/2017, 11:20 AM  Pager: [phone] Between 7am to 7pm - call Pager - [phone]  After 7pm go to [email or web address] - password [email or web address]  Call night coverage person covering after 7pm

## 2017-05-23 NOTE — Procedures (Signed)
Tol HD.   Afebrile, No hemodynamic issue.  [name] C

## 2017-05-23 NOTE — Progress Notes (Signed)
PT Cancellation Note  Patient Details Name: [name] KUMPF Sr. MRN: [id number] DOB: November 14, 1951   Cancelled Treatment:    Reason Eval/Treat Not Completed: Patient at procedure or test/unavailable   Currently in HD;  Will follow up later today as time allows;  Otherwise, will follow up for PT tomorrow;   Thank you,  Roney Marion, [place] Pager [phone] [place] 05/23/2017, 11:19 AM

## 2017-05-24 ENCOUNTER — Inpatient Hospital Stay (HOSPITAL_COMMUNITY): Payer: Medicare Other

## 2017-05-24 DIAGNOSIS — R509 Fever, unspecified: Secondary | ICD-10-CM

## 2017-05-24 LAB — GLUCOSE, CAPILLARY
GLUCOSE-CAPILLARY: 154 mg/dL — AB (ref 65–99)
Glucose-Capillary: 133 mg/dL — ABNORMAL HIGH (ref 65–99)
Glucose-Capillary: 145 mg/dL — ABNORMAL HIGH (ref 65–99)
Glucose-Capillary: 127 mg/dL — ABNORMAL HIGH (ref 65–99)

## 2017-05-24 NOTE — Progress Notes (Signed)
Subjective: 6 Days Post-Op Procedure(s) (LRB): 5TH RAY AMPUTATION  and EXCISION CUBOID (Left) Patient reports pain as mild.  Reports sweats at night. He states he is ready to go home whenever he is cleared.  Objective: Vital signs in last 24 hours: Temp:  [98 F (36.7 C)-99 F (37.2 C)] 98.5 F (36.9 C) (05/29 0605) Pulse Rate:  [70-78] 74 (05/29 0605) Resp:  [16-20] 16 (05/29 0605) BP: (106-156)/(52-93) 156/93 (05/29 0605) SpO2:  [97 %-100 %] 100 % (05/29 0605) Weight:  [90 kg (198 lb 6.6 oz)] 90 kg (198 lb 6.6 oz) (05/28 1145)  Intake/Output from previous day: 05/28 0701 - 05/29 0700 In: 430 [P.O.:180; IV Piggyback:250] Out: 2000  Intake/Output this shift: No intake/output data recorded.   Recent Labs  05/23/17 0743  HGB 11.7*    Recent Labs  05/23/17 0743  WBC 9.1  RBC 3.87*  HCT 35.7*  PLT 355    Recent Labs  05/23/17 0743  NA 135  K 4.5  CL 96*  CO2 23  BUN 61*  CREATININE 14.08*  GLUCOSE 148*  CALCIUM 8.7*  MRI scan of left foot yesterday showed no abscesses or evidence of significant residual osteomyelitis. Postoperative changes were noted. Left foot exam: Left foot dressing is changed. His wound is benign. There is no active drainage. No significant redness. Nylon sutures and staples are intact. No evidence of abscess or obvious fluid collection.   Assessment/Plan: 6 Days Post-Op Procedure(s) (LRB): 5TH RAY AMPUTATION  and EXCISION CUBOID (Left) Plan: Left foot dressing changed. He may weight-bear as tolerated on the left putting weight on his heel wearing the postop shoe. Doubt that his left foot is a source of his recent fevers. We will sign off. Please call if needed. He will need a follow-up with Dr. [name] in the office in 10-14 days. Further treatment per hospitalists and infectious disease physicians.    [place] G 05/24/2017, 8:45 AM

## 2017-05-24 NOTE — Progress Notes (Signed)
Physical Therapy Treatment Patient Details Name: [name] BRUINS Sr. MRN: [id number] DOB: [birth year] Today's Date: 05/24/2017    History of Present Illness [name] MIYOSHI Sr. is a pleasant 66 y.o. male with PMH significant for diabetes type 2 end-stage renal disease M-W-F dialysis schedule, CAD, anemia, hypertension, systolic heart failure presents to the emergency department from infectious disease clinic with the chief complaint of worsening left foot pain/increased drainage from a nonhealing foot ulcers. Initial evaluation concerning for osteomyelitis. Pt s/p 5th ray amputation and partial excision of cuboid (05/18/17)    PT Comments    Continuing work on functional mobility and activity tolerance, with good carryover from last session's stair training; OK for dc home from PT standpoint    Follow Up Recommendations  Home health PT;Supervision for mobility/OOB     Equipment Recommendations  Rolling walker with 5" wheels;3in1 (PT)    Recommendations for Other Services       Precautions / Restrictions Precautions Precautions: Fall Other Brace/Splint: post op shoe, WB on heel Restrictions LLE Weight Bearing: Weight bearing as tolerated (with post op shoe; through heel) Other Position/Activity Restrictions: post op shoe; wb through heel    Mobility  Bed Mobility Overal bed mobility: Modified Independent                Transfers Overall transfer level: Needs assistance Equipment used: Rolling walker (2 wheeled) Transfers: Sit to/from Stand Sit to Stand: Supervision         General transfer comment: Supervision fro safety  Ambulation/Gait Ambulation/Gait assistance: Min guard;Supervision Ambulation Distance (Feet): 200 Feet Assistive device: Rolling walker (2 wheeled) Gait Pattern/deviations: Step-to pattern;Decreased stance time - left;Antalgic Gait velocity: decreased   General Gait Details: Used a shoe on R foot to help even out limb length with Darco shoe  on L; dependent on RW for balance; minguard progressing to supervision   Stairs Stairs: Yes   Stair Management: No rails;Backwards;Step to pattern;With walker Number of Stairs: 2 General stair comments: demonstrated good safety and technique, good adherence to sequencing  Wheelchair Mobility    Modified Rankin (Stroke Patients Only)       Balance     Sitting balance-Leahy Scale: Good                                      Cognition Arousal/Alertness: Awake/alert Behavior During Therapy: WFL for tasks assessed/performed Overall Cognitive Status: Within Functional Limits for tasks assessed                                        Exercises      General Comments        Pertinent Vitals/Pain Pain Assessment: No/denies pain    Home Living                      Prior Function            PT Goals (current goals can now be found in the care plan section) Acute Rehab PT Goals Patient Stated Goal: return to independence; find out what is causing fevers PT Goal Formulation: With patient/family Time For Goal Achievement: 06/02/17 Potential to Achieve Goals: Good Progress towards PT goals: Progressing toward goals    Frequency    Min 3X/week      PT Plan Current plan  remains appropriate    Co-evaluation              AM-PAC PT "6 Clicks" Daily Activity  Outcome Measure  Difficulty turning over in bed (including adjusting bedclothes, sheets and blankets)?: None Difficulty moving from lying on back to sitting on the side of the bed? : None Difficulty sitting down on and standing up from a chair with arms (e.g., wheelchair, bedside commode, etc,.)?: A Little Help needed moving to and from a bed to chair (including a wheelchair)?: None Help needed walking in hospital room?: None Help needed climbing 3-5 steps with a railing? : A Little 6 Click Score: 22    End of Session Equipment Utilized During Treatment: Gait  belt Activity Tolerance: Patient tolerated treatment well Patient left: in chair;with call bell/phone within reach Nurse Communication: Mobility status PT Visit Diagnosis: Other abnormalities of gait and mobility (R26.89);Difficulty in walking, not elsewhere classified (R26.2);Pain Pain - Right/Left: Left Pain - part of body: Ankle and joints of foot     Time: 2258-3462 PT Time Calculation (min) (ACUTE ONLY): 27 min  Charges:  $Gait Training: 23-37 mins                    G Codes:       Roney Marion, PT  Acute Rehabilitation Services Pager [phone] Office [place] 05/24/2017, 4:18 PM

## 2017-05-24 NOTE — Progress Notes (Signed)
[facility] for Infectious Disease    Date of Admission:  05/16/2017   Total days of antibiotics 9           ID: [name] Trott. is a 66 y.o. male with left foot ulcer with fevers found to have osteo s/p amputation of 5th ray with partial cuboid amputation on 5/23 who still had ongoing fevers for 3 days post op and only recently fever free since 5/27. Concern for ongoing infection Principal Problem:   Osteomyelitis of left foot (HCC) Active Problems:   Anemia due to end stage renal disease (HCC)   Cardiomegaly   Benign essential HTN   Congestive dilated cardiomyopathy (HCC)   ESRD on dialysis ([place])   Peripheral arterial disease ([place])   ESRD on hemodialysis ([place])   Diabetes mellitus with complication ([place])    Subjective: Afebrile, looking forward to going home. He had foot mri that did not show any new areas of involvement of osteo  Medications:  . doxercalciferol  6 mcg Intravenous Q M,W,F-HD  . ferric citrate  210 mg Oral TID WC  . insulin aspart  0-5 Units Subcutaneous QHS  . insulin aspart  0-9 Units Subcutaneous TID WC  . metroNIDAZOLE  500 mg Oral Q8H  . multivitamin  1 tablet Oral QHS  . primidone  25 mg Oral QHS  . senna-docusate  1 tablet Oral BID    Objective: Vital signs in last 24 hours: Temp:  [98.2 F (36.8 C)-99 F (37.2 C)] 98.2 F (36.8 C) (05/29 1100) Pulse Rate:  [71-78] 78 (05/29 1100) Resp:  [16-18] 17 (05/29 1100) BP: (117-156)/(52-93) 144/87 (05/29 1100) SpO2:  [98 %-100 %] 100 % (05/29 1100)   Did not examine  Lab Results  Recent Labs  05/23/17 0743  WBC 9.1  HGB 11.7*  HCT 35.7*  NA 135  K 4.5  CL 96*  CO2 23  BUN 61*  CREATININE 14.08*   Liver Panel  Recent Labs  05/23/17 0743  ALBUMIN 2.5*    Microbiology: revewied Studies/Results: Mr Foot Left Wo Contrast  Result Date: 05/23/2017 CLINICAL DATA:  Fever of unknown origin post fifth ray amputation. EXAM: MRI OF THE LEFT FOOT WITHOUT CONTRAST TECHNIQUE:  Multiplanar, multisequence MR imaging of the left forefoot was performed. No intravenous contrast was administered. COMPARISON:  None. FINDINGS: Bones/Joint/Cartilage Postop soft tissue change overlying new fifth ray amputation. Chronic fourth phalangeal amputation. No new findings of osteomyelitis. No abscess. Mild reactive edema across the calcaneocuboid articulation and base of fourth metatarsal. Osteoarthritic change across the first MTP and navicular- medial cuneiform. Ligaments Intact and noncontributory Muscles and Tendons No evidence of pyomyositis. Generalized muscle atrophy. The visualized flexor and extensor tendons are intact. Soft tissues No fluid collection or hematoma.  No soft tissue mass. IMPRESSION: 1. Status post left fifth ray amputation for osteomyelitis. 2. Amputated fourth phalanx. 3. No new areas of cortical bone loss, edema nor soft tissue ulceration to suggest osteomyelitis. Electronically Signed   By: Ashley Royalty M.D.   On: 05/23/2017 23:09     Assessment/Plan: Left foot osteo s/p 5th ray amputation and partial cuboid amputation - now afebrile. Still somewhat perplexing that he had high fevers for roughly 3 days after what was presumed the source of infection being removed, while on abtx. Repeat foot mri did not suggest signs c/w residual osteo.  -recommend to treat for additional 2 wk post discharge with vancomycin plus ceftaz with hemodialysis, plus provide oral metronidazole 500mg  TID during  this time period-- for "mop up" residual disease on remaining cuboid bone.  - follow up with Dr [name] in clinic for wound check  - we will have the patient follow up with Dr [name] in 3 wks.  Baxter Flattery [facility] Cell: [phone] Pager: [phone]  05/24/2017, 5:23 PM

## 2017-05-24 NOTE — Progress Notes (Signed)
[facility]                                                                              Patient Demographics  [name], is a 66 y.o. male, DOB - [birth year], HEN:[id number]  Admit date - 05/16/2017   Admitting Physician Waldemar Dickens, MD  Outpatient Primary MD for the patient is [name], MD  Outpatient specialists:   LOS - 8  days    Chief Complaint  Patient presents with  . Wound Check  . Fever       Brief summary   Patient is a 66 year old male with diabetes, ESRD, MWF, CAD, anemia, hypertension, systolic CHF presented to ED from ID clinic with worsening left foot pain and increasing drainage from a nonhealing foot ulcers. Per patient he had developed ulcers from the lateral aspect of his left foot after having his fourth toe amputated. For the last 3 months he had received vancomycin and cefazolin at dialysis and was recently transitioned to doxycycline in the ID clinic. In ED, he had a temp of 101.77F.  The patient underwent a left fifth ray amputation and excision on 5/23 however postoperatively continued to spike fevers. ID, orthopedics has been following, will follow recommendations regarding antibiotics for disposition.   Assessment & Plan    Principal Problem:   Osteomyelitis of left foot (HCC) With nonhealing left foot ulcers possibly due to diabetic foot ulcers/Diabetes mellitus and underlying PAD - Patient has a history of osteomyelitis in his right foot. Patient received antibiotics via hemodialysis for several weeks prior to admission and was recently transitioned to oral doxycycline. - MRI of the left foot showed soft tissue ulcer overlying the base of the fifth metatarsal osteomyelitis of the base of the fifth metatarsal along the lateral margin, no abscess. - Patient underwent left fifth ray amputation and excision on 5/23 - Appreciate ID recommendations, continue vancomycin, added Fortaz and Flagyl by Dr. Drucilla Schmidt on 5/27. - Blood  cultures negative to date, wound cultures growing diphtheroids. 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  - Started on primidone, per his renal function, appreciate pharmacy to adjust his dose  Code Status: Full CODE STATUS  DVT Prophylaxis:Heparin  Family  Communication: Discussed in detail with the patient, all imaging results, lab results explained to the patient   Disposition Plan: Awaiting ID recommendations regarding antibiotics  Time Spent in minutes   25 minutes  Procedures:  Hemodialysis   PROCEDURES: 05/18/17 1. A 5th ray amputation. 2. Partial excision of the cuboid.  Consultants:   Orthopedics  infectious disease Nephrology  Antimicrobials:   IV vancomycin with hemodialysis 5/21 >>   IV Fortaz with hemodialysis  5/21 > 5/25 restarted again 5/27 >>  Meropenem 5/25 > 5/27  Flagyl 5/27 >>   Medications  Scheduled Meds: . doxercalciferol  6 mcg Intravenous Q M,W,F-HD  . ferric citrate  210 mg Oral TID WC  . insulin aspart  0-5 Units Subcutaneous QHS  . insulin aspart  0-9 Units Subcutaneous TID WC  . metroNIDAZOLE  500 mg Oral Q8H  . multivitamin  1 tablet Oral QHS  . primidone  25 mg Oral QHS  . senna-docusate  1 tablet Oral BID   Continuous Infusions: . sodium chloride    . cefTAZidime (FORTAZ)  IV 2 g (05/23/17 1807)  . vancomycin 1,000 mg (05/23/17 1052)   PRN Meds:.acetaminophen **OR**  acetaminophen, HYDROcodone-acetaminophen, ondansetron **OR** ondansetron (ZOFRAN) IV, polyethylene glycol   Antibiotics   Anti-infectives    Start     Dose/Rate Route Frequency Ordered Stop   05/23/17 1800  cefTAZidime (FORTAZ) 2 g in dextrose 5 % 50 mL IVPB     2 g 100 mL/hr over 30 Minutes Intravenous Every M-W-F (1800) 05/22/17 1056     05/23/17 0821  vancomycin (VANCOCIN) 1-5 GM/200ML-% IVPB    Comments:  Cherylann Banas   : cabinet override      05/23/17 0821 05/23/17 0944   05/22/17 1800  cefTAZidime (FORTAZ) 1 g in dextrose 5 % 50 mL IVPB     1 g 100 mL/hr over 30 Minutes Intravenous  Once 05/22/17 1056 05/22/17 1915   05/22/17 1400  metroNIDAZOLE (FLAGYL) tablet 500 mg     500 mg Oral Every 8 hours 05/22/17 1028     05/21/17 1800  meropenem (MERREM) 500 mg in sodium chloride 0.9 % 50 mL IVPB  Status:  Discontinued     500 mg 100 mL/hr over 30 Minutes Intravenous Every 24 hours 05/20/17 1334 05/22/17 1028   05/20/17 1400  meropenem (MERREM) 500 mg in sodium chloride 0.9 % 50 mL IVPB  Status:  Discontinued     500 mg 100 mL/hr over 30 Minutes Intravenous Every 24 hours 05/20/17 1332 05/20/17 1334   05/20/17 1345  meropenem (MERREM) 500 mg in sodium chloride 0.9 % 50 mL IVPB     500 mg 100 mL/hr over 30 Minutes Intravenous  Once 05/20/17 1334 05/20/17 1449   05/18/17 0600  vancomycin (VANCOCIN) IVPB 1000 mg/200 mL premix  Status:  Discontinued     1,000 mg 200 mL/hr over 60 Minutes Intravenous On call to O.R. 05/17/17 2338 05/18/17 1113   05/16/17 2200  vancomycin (VANCOCIN) IVPB 1000 mg/200 mL premix     1,000 mg 200 mL/hr over 60 Minutes Intravenous  Once 05/16/17 2002 05/16/17 2214   05/16/17 2100  vancomycin (VANCOCIN) 2,000 mg in sodium chloride 0.9 % 500 mL IVPB  Status:  Discontinued     2,000 mg 250 mL/hr over 120 Minutes Intravenous  Once 05/16/17 1809 05/16/17 2000   05/16/17 2100  vancomycin (VANCOCIN) IVPB 1000 mg/200 mL premix     1,000 mg 200 mL/hr over 60  Minutes Intravenous  Once 05/16/17 2000 05/16/17 2030   05/16/17 2000  cefTAZidime (FORTAZ) 2 g in dextrose 5 % 50 mL IVPB  Status:  Discontinued     2 g 100 mL/hr over 30 Minutes Intravenous Every M-W-F (2000) 05/16/17 1610 05/20/17 1322   05/16/17 2000  vancomycin (VANCOCIN) IVPB 1000 mg/200 mL premix  Status:  Discontinued     1,000 mg 200 mL/hr over 60 Minutes Intravenous  Once 05/16/17 1633 05/16/17 1809   05/16/17 1615  vancomycin (VANCOCIN) IVPB 1000 mg/200 mL premix     1,000 mg 200 mL/hr over 60 Minutes Intravenous Every M-W-F (Hemodialysis) 05/16/17 1603     05/16/17 1600  vancomycin (VANCOCIN) 2,000 mg in sodium chloride 0.9 % 500 mL IVPB  Status:  Discontinued     2,000 mg 250 mL/hr over 120 Minutes Intravenous  Once 05/16/17 1603 05/16/17 1609        Subjective:   Raeshawn Vo was seen and examined today. No complaints per patient, feeling overall improving, has not been spiking any fevers. Afebrile this morning.  Patient denies chest pain, shortness of breath, abdominal pain, N/V, new weakness, numbess, tingling.  Objective:   Vitals:   05/23/17 1730 05/23/17 2113 05/24/17 0605 05/24/17 1100  BP: 121/80 (!) 117/52 (!) 156/93 (!) 144/87  Pulse: 71 71 74 78  Resp: 18 18 16 17   Temp: 99 F (37.2 C) 98.6 F (37 C) 98.5 F (36.9 C) 98.2 F (36.8 C)  TempSrc: Oral Oral Oral Oral  SpO2: 99% 98% 100% 100%  Weight:      Height:        Intake/Output Summary (Last 24 hours) at 05/24/17 1400 Last data filed at 05/24/17 1300  Gross per 24 hour  Intake              670 ml  Output                0 ml  Net              670 ml     Wt Readings from Last 3 Encounters:  05/23/17 90 kg (198 lb 6.6 oz)  05/16/17 96.2 kg (212 lb)  03/07/17 95.7 kg (211 lb)     Exam  General: Alert and oriented 3 NAD  HEENT: PERRLA, EOMI  Neck: No JVD   Cardiovascular:S1And S2 clear, regular rate and rhythm  Respiratory: Clear to auscultation bilaterally  Gastrointestinal:  Soft, nontender, nondistended, normal bowel sounds  Ext: left foot dressing intact  neuro: No focal neurological deficits  Skin:  Left foot dressing intact  psyc: Normal affect and demeanor, alert and oriented x3    Data Reviewed:  I have personally reviewed following labs and imaging studies  Micro Results Recent Results (from the past 240 hour(s))  Wound or Superficial Culture     Status: Abnormal   Collection Time: 05/16/17  2:26 PM  Result Value Ref Range Status   Specimen Description WOUND ULCER  Final   Special Requests NONE  Final   Gram Stain   Final    MODERATE WBC PRESENT, PREDOMINANTLY PMN FEW GRAM POSITIVE COCCI RARE GRAM NEGATIVE RODS    Culture MULTIPLE ORGANISMS PRESENT, NONE PREDOMINANT (A)  Final   Report Status 05/19/2017 FINAL  Final  MRSA PCR Screening     Status: None   Collection Time: 05/16/17 10:11 PM  Result Value Ref Range Status   MRSA by PCR NEGATIVE NEGATIVE Final    Comment:        The GeneXpert MRSA Assay (FDA approved for NASAL specimens only), is one component of a comprehensive MRSA colonization surveillance program. It is not intended to diagnose MRSA infection nor to guide or monitor treatment for MRSA infections.   Surgical pcr screen     Status: None   Collection Time: 05/17/17  9:57 PM  Result Value Ref Range Status   MRSA, PCR NEGATIVE NEGATIVE Final   Staphylococcus aureus NEGATIVE NEGATIVE Final    Comment:        The Xpert SA Assay (FDA approved for NASAL specimens in patients over 68 years of age), is one component of a comprehensive surveillance program.  Test performance has been validated by [facility] for patients greater than or equal to 7 year old. It is not intended to diagnose infection nor to guide or monitor treatment.   Culture, blood (routine x 2)     Status: None (Preliminary result)   Collection Time: 05/19/17 10:10 PM  Result Value Ref Range Status   Specimen Description BLOOD RIGHT ARM  Final     Special Requests   Final    BOTTLES DRAWN AEROBIC AND ANAEROBIC Blood Culture adequate volume   Culture NO GROWTH 3 DAYS  Final   Report Status PENDING  Incomplete  Culture, blood (routine x 2)     Status: None (Preliminary result)   Collection Time: 05/19/17 10:17 PM  Result Value Ref Range Status   Specimen Description BLOOD RIGHT HAND  Final   Special Requests   Final    BOTTLES DRAWN AEROBIC AND ANAEROBIC Blood Culture adequate volume   Culture NO GROWTH 3 DAYS  Final   Report Status PENDING  Incomplete    Radiology Reports Dg Chest 2 View  Result Date: 05/21/2017 CLINICAL DATA:  Peripheral vascular disease, diabetic neuropathy, fever, recent amputation LEFT foot, hypertension, cardiomyopathy EXAM: CHEST  2 VIEW COMPARISON:  05/18/2017 FINDINGS: Enlargement of cardiac silhouette. Atherosclerotic calcification aorta. Pulmonary vascular congestion. Lungs clear. No pleural effusion or pneumothorax. Bones unremarkable. IMPRESSION: Enlargement of cardiac silhouette with pulmonary vascular congestion. Aortic atherosclerosis. Electronically Signed   By: Lavonia Dana M.D.   On: 05/21/2017 12:17   Mr Foot Left Wo Contrast  Result Date: 05/23/2017 CLINICAL DATA:  Fever of unknown origin post fifth ray amputation. EXAM: MRI OF THE LEFT FOOT WITHOUT CONTRAST TECHNIQUE: Multiplanar, multisequence MR imaging of the left forefoot was performed. No intravenous contrast was administered. COMPARISON:  None. FINDINGS: Bones/Joint/Cartilage Postop soft tissue change overlying new fifth ray amputation. Chronic fourth phalangeal amputation. No new findings of osteomyelitis. No abscess. Mild reactive edema across the calcaneocuboid articulation and base of fourth metatarsal. Osteoarthritic change across the first MTP and navicular- medial cuneiform. Ligaments Intact and noncontributory Muscles and Tendons No evidence of pyomyositis. Generalized muscle atrophy. The visualized flexor and extensor tendons are  intact. Soft tissues No fluid collection or hematoma.  No soft tissue mass. IMPRESSION: 1. Status post left fifth ray amputation for osteomyelitis. 2. Amputated fourth phalanx. 3. No new areas of cortical bone loss, edema nor soft tissue ulceration to suggest osteomyelitis. Electronically Signed   By: Ashley Royalty M.D.   On: 05/23/2017 23:09   Mr Foot Left Wo Contrast  Result Date: 05/17/2017 CLINICAL DATA:  Nonhealing ulcer involving the left foot. EXAM: MRI OF THE LEFT FOOT WITHOUT CONTRAST TECHNIQUE: Multiplanar, multisequence MR imaging of the left forefoot was performed. No intravenous contrast was administered. COMPARISON:  None. FINDINGS: Bones/Joint/Cartilage Soft tissue ulcer overlying the base of the fifth metatarsal. Ulcer extends to the cortex of the base of the fifth metatarsal along the lateral margin with cortical irregularity, underlying bone marrow edema and T1 hypointensity most consistent with osteomyelitis. Amputation of the fourth phalanx. No acute fracture or dislocation. Normal alignment. No joint effusion. Mild osteoarthritis of the first MTP joint. Mild osteoarthritis of the second  MTP joint. Mild osteoarthritis of the navicular- medial cuneiform joint. Ligaments Collateral ligaments are intact.  Lisfranc ligament is intact. Muscles and Tendons Flexor, peroneal and extensor compartment tendons are intact. Generalized muscle atrophy. Soft tissue No fluid collection or hematoma. No soft tissue mass. Soft tissue edema along the lateral aspect of the foot overlying the fifth metatarsal consistent with cellulitis. IMPRESSION: 1. Soft tissue ulcer overlying the base of the fifth metatarsal. 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 No drainable fluid collection to suggest an abscess. Electronically Signed   By: Kathreen Devoid   On: 05/17/2017 08:15   Dg Chest Port 1 View  Result Date: 05/18/2017 CLINICAL DATA:  Shortness of Breath EXAM: PORTABLE CHEST 1 VIEW  COMPARISON:  09/11/2016 FINDINGS: Cardiac shadow is mildly enlarged. The lungs are well aerated bilaterally. No focal infiltrate or sizable effusion is noted. No bony abnormality is noted IMPRESSION: No active disease. Electronically Signed   By: Inez Catalina M.D.   On: 05/18/2017 07:58   Dg Foot Complete Left  Result Date: 05/21/2017 CLINICAL DATA:  Peripheral vascular disease, diabetic neuropathy, LEFT toe amputation, irrigation, debridement, fever, history hypertension, cardiomyopathy EXAM: LEFT FOOT - COMPLETE 3+ VIEW COMPARISON:  05/16/2017 FINDINGS: Interval amputation of the fifth ray soft tissue swelling, skin clips and overlying dressing artifacts. Prior amputation of fourth toe. Diffuse osseous demineralization. Old healed fracture of the proximal phalanx great toe. No acute fracture or dislocation. Cortical margination of the lateral base of the fourth metatarsal and the lateral margin of the cuboid appears indistinct, could be related to surgery but cannot exclude bone destruction / osteomyelitis. Scattered small vessel vascular calcifications. IMPRESSION: Interval amputation of the fifth ray. Question cortical loss at the lateral base of the fourth metatarsal at the lateral margin of the cuboid, question related to preceding surgery but unable to exclude subtle bone destruction/osteomyelitis. Electronically Signed   By: Lavonia Dana M.D.   On: 05/21/2017 12:16   Dg Foot Complete Left  Result Date: 05/16/2017 CLINICAL DATA:  Left foot soft tissue wound with drainage and pain and fevers, initial encounter EXAM: LEFT FOOT - COMPLETE 3+ VIEW COMPARISON:  05/03/2017 FINDINGS: Degenerative changes at first MTP joint are noted. Soft tissue defect is again noted at the distal aspect of the fifth toe as well as laterally in the foot. There are some erosive changes identified at the base of the fifth metatarsal which are new from the prior exam consistent with osteomyelitis. Chronic changes in the heads of  the second and fourth metatarsals are seen. Changes of prior fourth toe amputation are again noted. IMPRESSION: New area of lucency at the base of the fifth metatarsal. This is consistent with underlying osteomyelitis. Electronically Signed   By: Inez Catalina M.D.   On: 05/16/2017 14:14   Dg Foot Complete Left  Result Date: 05/03/2017 CLINICAL DATA:  66 year old male with chronic left foot wounds EXAM: LEFT FOOT - COMPLETE 3+ VIEW COMPARISON:  Prior radiographs of the left foot 01/04/2017 FINDINGS: Stable postsurgical changes of prior fourth digit amputation. Sequelae of prior Freiberg's infraction at the head of the second metatarsal are similar compared to prior. No evidence of new bony refraction or due destruction to suggest acute osteomyelitis. Soft tissue irregularity present at the tip of the fifth toe, and laterally adjacent to the base of the fifth metatarsal consistent with ulcerations. Calcifications are present of the digital arteries bilaterally. Degenerative osteoarthritis present in the great toe MTP joint. IMPRESSION: 1.  No convincing conventional radiographic evidence of active osteomyelitis. 2. Active soft tissue ulceration at the tip of the fifth digit, and at the lateral aspect of the foot adjacent the base of the fifth metatarsal. 3. Surgical changes of prior fourth toe amputation. 4. 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 5. Small vessel atherosclerotic vascular calcifications. 6. 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 Electronically Signed   By: Jacqulynn Cadet M.D.   On: 05/03/2017 14:23    Lab Data:  CBC:  Recent Labs Lab 05/18/17 0714 05/19/17 0413 05/20/17 0815 05/23/17 0743  WBC 9.0 8.7 10.3 9.1  HGB 12.5* 12.3* 12.1* 11.7*  HCT 38.0* 39.1 37.3* 35.7*  MCV 93.1 96.3 93.5 92.2  PLT 291 258 251 681   Basic Metabolic Panel:  Recent Labs Lab 05/18/17 0714 05/19/17 0413  05/20/17 0815 05/23/17 0743  NA 131* 134* 132* 135  K 4.5 4.9 4.4 4.5  CL 90* 94* 95* 96*  CO2 24 26 21* 23  GLUCOSE 156* 156* 113* 148*  BUN 48* 33* 51* 61*  CREATININE 11.94* 9.76* 13.21* 14.08*  CALCIUM 8.9 8.0* 7.9* 8.7*  PHOS 6.9* 4.9* 5.5* 7.2*   GFR: Estimated Creatinine Clearance: 5.9 mL/min (A) (by C-G formula based on SCr of 14.08 mg/dL (H)). Liver Function Tests:  Recent Labs Lab 05/18/17 0714 05/19/17 0413 05/20/17 0815 05/23/17 0743  ALBUMIN 2.9* 2.9* 2.8* 2.5*   No results for input(s): LIPASE, AMYLASE in the last 168 hours. No results for input(s): AMMONIA in the last 168 hours. Coagulation Profile: No results for input(s): INR, PROTIME in the last 168 hours. Cardiac Enzymes: No results for input(s): CKTOTAL, CKMB, CKMBINDEX, TROPONINI in the last 168 hours. BNP (last 3 results) No results for input(s): PROBNP in the last 8760 hours. HbA1C: No results for input(s): HGBA1C in the last 72 hours. CBG:  Recent Labs Lab 05/23/17 1222 05/23/17 1730 05/23/17 2111 05/24/17 0808 05/24/17 1138  GLUCAP 176* 183* 147* 133* 154*   Lipid Profile: No results for input(s): CHOL, HDL, LDLCALC, TRIG, CHOLHDL, LDLDIRECT in the last 72 hours. Thyroid Function Tests: No results for input(s): TSH, T4TOTAL, FREET4, T3FREE, THYROIDAB in the last 72 hours. Anemia Panel: No results for input(s): VITAMINB12, FOLATE, FERRITIN, TIBC, IRON, RETICCTPCT in the last 72 hours. Urine analysis:    Component Value Date/Time   COLORURINE YELLOW 09/11/2016 1933   APPEARANCEUR CLEAR 09/11/2016 1933   LABSPEC 1.015 09/11/2016 1933   PHURINE 8.5 (H) 09/11/2016 1933   GLUCOSEU 100 (A) 09/11/2016 1933   HGBUR NEGATIVE 09/11/2016 1933   BILIRUBINUR SMALL (A) 09/11/2016 1933   KETONESUR NEGATIVE 09/11/2016 1933   PROTEINUR 100 (A) 09/11/2016 1933   UROBILINOGEN 0.2 11/22/2014 1740   NITRITE NEGATIVE 09/11/2016 1933   LEUKOCYTESUR NEGATIVE 09/11/2016 1933     Aakash Hollomon  M.D. [facility] 05/24/2017, 2:00 PM  Pager: [phone] Between 7am to 7pm - call Pager - [phone]  After 7pm go to [email or web address] - password [email or web address]  Call night coverage person covering after 7pm

## 2017-05-24 NOTE — Progress Notes (Signed)
*  PRELIMINARY RESULTS* Vascular Ultrasound Bilateral upper and lower extremity venous duplex has been completed.  Preliminary findings: No evidence of deep or superficial vein thrombosis in the bilateral upper and lower extremities.  Negative for baker's cysts bilaterally. Left AVF noted.   [name] 05/24/2017, 12:24 PM

## 2017-05-24 NOTE — Evaluation (Signed)
Occupational Therapy Evaluation Patient Details Name: [name] CLUBB Sr. MRN: [id number] DOB: 1951/06/15 Today's Date: 05/24/2017    History of Present Illness [name] MODER Sr. is a pleasant 66 y.o. male with PMH significant for diabetes type 2 end-stage renal disease M-W-F dialysis schedule, CAD, anemia, hypertension, systolic heart failure presents to the emergency department from infectious disease clinic with the chief complaint of worsening left foot pain/increased drainage from a nonhealing foot ulcers. Initial evaluation concerning for osteomyelitis. 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 At baseline, he is independent with adls; he has adapted technique for drawing insulin due to tremor.  He is concerned that RUE now has slight tremor.  Reviewed stabilizing techniques and will try weighted utensils, if available.  Will follow him in acute setting with supervision level goals. Pt was min guard this am.    Follow Up Recommendations  No OT follow up    Equipment Recommendations  3 in 1 bedside commode    Recommendations for Other Services       Precautions / Restrictions Precautions Precautions: Fall Restrictions LLE Weight Bearing: Weight bearing as tolerated (with post op shoe; through heel) Other Position/Activity Restrictions: post op shoe; wb through heel      Mobility Bed Mobility               General bed mobility comments: at eob  Transfers   Equipment used: Rolling walker (2 wheeled)   Sit to Stand: Min guard         General transfer comment: initially unsteady when standing; improved over course of eval    Balance             Standing balance-Leahy Scale:  (poor to fair)                             ADL either performed or assessed with clinical judgement   ADL Overall ADL's : Needs assistance/impaired Eating/Feeding: Modified independent    Grooming: Oral care;Supervision/safety;Standing   Upper Body Bathing: Set up;Sitting   Lower Body Bathing: Min guard;Sit to/from stand   Upper Body Dressing : Set up;Sitting   Lower Body Dressing: Min guard;Sit to/from stand   Toilet Transfer: Min guard;Ambulation;Regular Toilet;Grab bars   Toileting- Clothing Manipulation and Hygiene: Min guard;Sit to/from stand         General ADL Comments: pt would benefit from 3:1 over toilet and in tub, if he is allowed to shower.  Discussed different placements of this.  He does not want tub bench as this is temporary.  Pt has made modifications for tremors; educated on weight bearing through full forearm, tucking arms against rib cage when performing tasks. Will try weighted utensils, if we have them available     Vision         Perception     Praxis      Pertinent Vitals/Pain  denies pain     Hand Dominance Right   Extremity/Trunk Assessment Upper Extremity Assessment BUE Deficits / Details: tremor noted:  Mild; pt states am is less tremor           Communication Communication Communication: No difficulties   Cognition Arousal/Alertness: Awake/alert Behavior During Therapy: WFL for tasks assessed/performed Overall Cognitive Status: Within Functional Limits for tasks assessed  General Comments       Exercises     Shoulder Instructions      Home Living Family/patient expects to be discharged to:: Private residence Living Arrangements: Spouse/significant other Available Help at Discharge: Family               Bathroom Shower/Tub: Teacher, early years/pre: Standard                Prior Functioning/Environment Level of Independence: Independent        Comments: has adapted methods with insulin due to tremor        OT Problem List: Decreased strength;Impaired balance (sitting and/or standing);Decreased knowledge of use of DME or  AE;Decreased coordination      OT Treatment/Interventions: Self-care/ADL training;DME and/or AE instruction;Patient/family education;Balance training    OT Goals(Current goals can be found in the care plan section) Acute Rehab OT Goals Patient Stated Goal: return to independence; find out what is causing fevers OT Goal Formulation: With patient Time For Goal Achievement: 05/31/17 Potential to Achieve Goals: Good ADL Goals Pt Will Transfer to Toilet: with supervision;ambulating;bedside commode Additional ADL Goal #1: pt will gather adl supplies at supervision level and complete adl without supervision/assist Additional ADL Goal #2: pt try weighted utensils and demonstrate strategies to decrease tremor during adl activities  OT Frequency: Min 2X/week   Barriers to D/C:            Co-evaluation              AM-PAC PT "6 Clicks" Daily Activity     Outcome Measure Help from another person eating meals?: None Help from another person taking care of personal grooming?: A Little Help from another person toileting, which includes using toliet, bedpan, or urinal?: A Little Help from another person bathing (including washing, rinsing, drying)?: A Little Help from another person to put on and taking off regular upper body clothing?: A Little Help from another person to put on and taking off regular lower body clothing?: A Little 6 Click Score: 19   End of Session    Activity Tolerance: Patient tolerated treatment well Patient left: in bed;with call bell/phone within reach (EOB)  OT Visit Diagnosis: Unsteadiness on feet (R26.81)                Time: 8841-6606 OT Time Calculation (min): 34 min Charges:  OT General Charges $OT Visit: 1 Procedure OT Evaluation $OT Eval Low Complexity: 1 Procedure OT Treatments $Self Care/Home Management : 8-22 mins G-Codes:     [place], OTR/L [phone] 05/24/2017  [place] 05/24/2017, 8:45 AM

## 2017-05-24 NOTE — Care Management Important Message (Signed)
Important Message  Patient Details  Name: [name]. MRN: [id number] Date of Birth: 1951-06-17   Medicare Important Message Given:  Yes    Eugen Jeansonne 05/24/2017, 2:41 PM

## 2017-05-24 NOTE — Progress Notes (Signed)
[facility] Progress Note   Subjective:    No fevers overnight Feeling better overall  No issues with HD yesterday net UF 2L  No osteo on foot MRI For Doppler study today   Objective Vitals:   05/23/17 1200 05/23/17 1730 05/23/17 2113 05/24/17 0605  BP: (!) 115/52 121/80 (!) 117/52 (!) 156/93  Pulse: 72 71 71 74  Resp:  18 18 16   Temp: 98.8 F (37.1 C) 99 F (37.2 C) 98.6 F (37 C) 98.5 F (36.9 C)  TempSrc: Oral Oral Oral Oral  SpO2: 97% 99% 98% 100%  Weight:      Height:       Physical Exam General: NAD in bed Heart: RRR Lungs: no rales Abdomen: soft NT Extremities: left foot wrapped - tr LE edema Dialysis Access:  Left AVF + bruit   Recent Labs Lab 05/19/17 0413 05/20/17 0815 05/23/17 0743  NA 134* 132* 135  K 4.9 4.4 4.5  CL 94* 95* 96*  CO2 26 21* 23  GLUCOSE 156* 113* 148*  BUN 33* 51* 61*  CREATININE 9.76* 13.21* 14.08*  CALCIUM 8.0* 7.9* 8.7*  PHOS 4.9* 5.5* 7.2*    Recent Labs Lab 05/19/17 0413 05/20/17 0815 05/23/17 0743  ALBUMIN 2.9* 2.8* 2.5*    Recent Labs Lab 05/18/17 0714 05/19/17 0413 05/20/17 0815 05/23/17 0743  WBC 9.0 8.7 10.3 9.1  HGB 12.5* 12.3* 12.1* 11.7*  HCT 38.0* 39.1 37.3* 35.7*  MCV 93.1 96.3 93.5 92.2  PLT 291 258 251 355   Blood Culture    Component Value Date/Time   SDES BLOOD RIGHT HAND 05/19/2017 2217   SPECREQUEST  05/19/2017 2217    BOTTLES DRAWN AEROBIC AND ANAEROBIC Blood Culture adequate volume   CULT NO GROWTH 3 DAYS 05/19/2017 2217   REPTSTATUS PENDING 05/19/2017 2217     Recent Labs Lab 05/22/17 2217 05/23/17 1222 05/23/17 1730 05/23/17 2111 05/24/17 0808  GLUCAP 156* 176* 183* 147* 133*    Mr Foot Left Wo Contrast  Result Date: 05/23/2017 CLINICAL DATA:  Fever of unknown origin post fifth ray amputation. EXAM: MRI OF THE LEFT FOOT WITHOUT CONTRAST TECHNIQUE: Multiplanar, multisequence MR imaging of the left forefoot was performed. No intravenous contrast was  administered. COMPARISON:  None. FINDINGS: Bones/Joint/Cartilage Postop soft tissue change overlying new fifth ray amputation. Chronic fourth phalangeal amputation. No new findings of osteomyelitis. No abscess. Mild reactive edema across the calcaneocuboid articulation and base of fourth metatarsal. Osteoarthritic change across the first MTP and navicular- medial cuneiform. Ligaments Intact and noncontributory Muscles and Tendons No evidence of pyomyositis. Generalized muscle atrophy. The visualized flexor and extensor tendons are intact. Soft tissues No fluid collection or hematoma.  No soft tissue mass. IMPRESSION: 1. Status post left fifth ray amputation for osteomyelitis. 2. Amputated fourth phalanx. 3. No new areas of cortical bone loss, edema nor soft tissue ulceration to suggest osteomyelitis. Electronically Signed   By: Ashley Royalty M.D.   On: 05/23/2017 23:09   Medications:  . sodium chloride    . cefTAZidime (FORTAZ)  IV 2 g (05/23/17 1807)  . vancomycin 1,000 mg (05/23/17 1052)   . doxercalciferol  6 mcg Intravenous Q M,W,F-HD  . ferric citrate  210 mg Oral TID WC  . insulin aspart  0-5 Units Subcutaneous QHS  . insulin aspart  0-9 Units Subcutaneous TID WC  . metroNIDAZOLE  500 mg Oral Q8H  . multivitamin  1 tablet Oral QHS  . primidone  25 mg Oral QHS  .  senna-docusate  1 tablet Oral BID   Dialysis Orders:  [facility] 4.25 hours 425/800 EDW 92.5 kg will be lower at discharge 2K2.25 Ca L AVF Hectorol 4 mcg Heparin 6000 units (sensipar 60 2/day at home; bider auryxia 2 tid w/meals)  Background: 66 y.o.year-old AAM w/ESRD, DM2, HTN, NICM with sCHF. Has had issues with foot ulcers with osteo of R foot earlier this year treated with V/F/hyperbaric O2. Current admission for ulceration lateral aspect LEFT foot with associated chills, temps to 102, foot pain/drainage/foul odor. Plain film with lucency base of L 5th metatarsal c/w osteo. Admitted for IV ATB's. Had L 5th ray  amputation 05/18/17.  Assessment/Plan: 1. Osteomyelitis L fifth metatarsal-s/p 5th ray amp 5/23Persistent fever spikes ID following - repeat MRI 5/28 no osteo Pt had repeat [facility] 5/24 ngtd On IV Vanc/Fortaz PO Flagyl For Doppler study today per ID recs  2. ESRD- MWF   resume tight heparin  3. Secondary HPT- hectorol with HD. Sensipar 120/day. [name]. 2ac; Ca and P  improved in controlled environment- 4. DM- insulin per primary team 5. 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 HTN/Volume - no BP meds. Under EDW BP ok/ Post HD wt 5/28 90 kg  7. PAD - has had prior evaluation for large vessel ds (Dr. Donnetta Hutching) and felt adequate flow to heal amputations    Lynnda Child PA-C [place] Pager [phone] 05/24/2017,9:03 AM  Renal Attending: He feels better today. Temp down.  I agree with note above. For HD MWF. Nicholad Kautzman C

## 2017-05-25 DIAGNOSIS — M86572 Other chronic hematogenous osteomyelitis, left ankle and foot: Secondary | ICD-10-CM

## 2017-05-25 LAB — CULTURE, BLOOD (ROUTINE X 2)
CULTURE: NO GROWTH
Culture: NO GROWTH
SPECIAL REQUESTS: ADEQUATE
Special Requests: ADEQUATE

## 2017-05-25 LAB — CBC
HEMATOCRIT: 36.1 % — AB (ref 39.0–52.0)
HEMOGLOBIN: 11.6 g/dL — AB (ref 13.0–17.0)
MCH: 29.9 pg (ref 26.0–34.0)
MCHC: 32.1 g/dL (ref 30.0–36.0)
MCV: 93 fL (ref 78.0–100.0)
Platelets: 389 10*3/uL (ref 150–400)
RBC: 3.88 MIL/uL — AB (ref 4.22–5.81)
RDW: 15.9 % — AB (ref 11.5–15.5)
WBC: 8.2 10*3/uL (ref 4.0–10.5)

## 2017-05-25 LAB — RENAL FUNCTION PANEL
ALBUMIN: 2.6 g/dL — AB (ref 3.5–5.0)
Anion gap: 17 — ABNORMAL HIGH (ref 5–15)
BUN: 61 mg/dL — AB (ref 6–20)
CHLORIDE: 93 mmol/L — AB (ref 101–111)
CO2: 22 mmol/L (ref 22–32)
Calcium: 9 mg/dL (ref 8.9–10.3)
Creatinine, Ser: 12.01 mg/dL — ABNORMAL HIGH (ref 0.61–1.24)
GFR, EST AFRICAN AMERICAN: 4 mL/min — AB (ref >60)
GFR, EST NON AFRICAN AMERICAN: 4 mL/min — AB (ref >60)
Glucose, Bld: 155 mg/dL — ABNORMAL HIGH (ref 65–99)
PHOSPHORUS: 7.1 mg/dL — AB (ref 2.5–4.6)
POTASSIUM: 4.4 mmol/L (ref 3.5–5.1)
Sodium: 132 mmol/L — ABNORMAL LOW (ref 135–145)

## 2017-05-25 LAB — GLUCOSE, CAPILLARY: GLUCOSE-CAPILLARY: 213 mg/dL — AB (ref 65–99)

## 2017-05-25 MED ORDER — METRONIDAZOLE 500 MG PO TABS: 500.0000 mg | ORAL_TABLET | Freq: Three times a day (TID) | ORAL | 0 refills | Status: DC

## 2017-05-25 MED ORDER — SODIUM CHLORIDE 0.9 % IV SOLN: 100.0000 mL | INTRAVENOUS | Status: DC | PRN

## 2017-05-25 MED ORDER — DOXERCALCIFEROL 4 MCG/2ML IV SOLN
INTRAVENOUS | Status: AC
  Administered 2017-05-25: 10:00:00
  Filled 2017-05-25: qty 4

## 2017-05-25 MED ORDER — LIDOCAINE HCL (PF) 1 % IJ SOLN: 5.0000 mL | INTRAMUSCULAR | Status: DC | PRN

## 2017-05-25 MED ORDER — VANCOMYCIN HCL IN DEXTROSE 1-5 GM/200ML-% IV SOLN
INTRAVENOUS | Status: AC
  Filled 2017-05-25: qty 200

## 2017-05-25 MED ORDER — PRIMIDONE 50 MG PO TABS: 25.0000 mg | ORAL_TABLET | Freq: Every day | ORAL | 0 refills | Status: DC

## 2017-05-25 MED ORDER — LIDOCAINE-PRILOCAINE 2.5-2.5 % EX CREA
1.0000 | TOPICAL_CREAM | CUTANEOUS | Status: DC | PRN
Start: 2017-05-25 — End: 2017-05-25

## 2017-05-25 MED ORDER — PENTAFLUOROPROP-TETRAFLUOROETH EX AERO: 1.0000 "application " | INHALATION_SPRAY | CUTANEOUS | Status: DC | PRN

## 2017-05-25 MED ORDER — HEPARIN SODIUM (PORCINE) 1000 UNIT/ML DIALYSIS: 1000.0000 [IU] | INTRAMUSCULAR | Status: DC | PRN

## 2017-05-25 MED ORDER — HEPARIN SODIUM (PORCINE) 1000 UNIT/ML DIALYSIS: 20.0000 [IU]/kg | INTRAMUSCULAR | Status: DC | PRN

## 2017-05-25 NOTE — Progress Notes (Signed)
[facility] for Infectious Disease    Date of Admission:  05/16/2017   Total days of antibiotics 10           ID: [name]. is a 66 y.o. male with left foot ulcer with fevers found to have osteo s/p amputation of 5th ray with partial cuboid amputation on 5/23 who still had ongoing fevers for 3 days post op and only recently fever free since 5/27. Concern for ongoing infection Principal Problem:   Osteomyelitis of left foot (HCC) Active Problems:   Anemia due to end stage renal disease (HCC)   Cardiomegaly   Benign essential HTN   Congestive dilated cardiomyopathy (HCC)   ESRD on dialysis ([place])   Peripheral arterial disease ([place])   ESRD on hemodialysis ([place])   Diabetes mellitus with complication (HCC)    Subjective: Afebrile, no diarrhea  Medications:  . doxercalciferol  6 mcg Intravenous Q M,W,F-HD  . ferric citrate  210 mg Oral TID WC  . insulin aspart  0-5 Units Subcutaneous QHS  . insulin aspart  0-9 Units Subcutaneous TID WC  . metroNIDAZOLE  500 mg Oral Q8H  . multivitamin  1 tablet Oral QHS  . primidone  25 mg Oral QHS  . senna-docusate  1 tablet Oral BID    Objective: Vital signs in last 24 hours: Temp:  [97 F (36.1 C)-98.7 F (37.1 C)] 98.4 F (36.9 C) (05/30 1138) Pulse Rate:  [70-85] 76 (05/30 1138) Resp:  [16-18] 16 (05/30 1138) BP: (91-170)/(52-86) 163/86 (05/30 1138) SpO2:  [97 %-100 %] 97 % (05/30 0730) Weight:  [199 lb 8.3 oz (90.5 kg)-203 lb 0.7 oz (92.1 kg)] 199 lb 8.3 oz (90.5 kg) (05/30 1138) Gen= axo by NAD HEENT: no signs of thrush Ext: left foot surgical site of lateral foot is c/d/i except distal incision slight swelling not approximating, otherwise the entire incision is held together with staples.  Skin = superficial blister has dried on dorsal aspect of foot  * pictures given to patient for monitoring  Lab Results  Recent Labs  05/23/17 0743 05/25/17 0745 05/25/17 0746  WBC 9.1 8.2  --   HGB 11.7* 11.6*  --     HCT 35.7* 36.1*  --   NA 135  --  132*  K 4.5  --  4.4  CL 96*  --  93*  CO2 23  --  22  BUN 61*  --  61*  CREATININE 14.08*  --  12.01*   Liver Panel  Recent Labs  05/23/17 0743 05/25/17 0746  ALBUMIN 2.5* 2.6*   Microbiology: revewied Studies/Results: Mr Foot Left Wo Contrast  Result Date: 05/23/2017 CLINICAL DATA:  Fever of unknown origin post fifth ray amputation. EXAM: MRI OF THE LEFT FOOT WITHOUT CONTRAST TECHNIQUE: Multiplanar, multisequence MR imaging of the left forefoot was performed. No intravenous contrast was administered. COMPARISON:  None. FINDINGS: Bones/Joint/Cartilage Postop soft tissue change overlying new fifth ray amputation. Chronic fourth phalangeal amputation. No new findings of osteomyelitis. No abscess. Mild reactive edema across the calcaneocuboid articulation and base of fourth metatarsal. Osteoarthritic change across the first MTP and navicular- medial cuneiform. Ligaments Intact and noncontributory Muscles and Tendons No evidence of pyomyositis. Generalized muscle atrophy. The visualized flexor and extensor tendons are intact. Soft tissues No fluid collection or hematoma.  No soft tissue mass. IMPRESSION: 1. Status post left fifth ray amputation for osteomyelitis. 2. Amputated fourth phalanx. 3. No new areas of cortical bone loss, edema nor soft  tissue ulceration to suggest osteomyelitis. Electronically Signed   By: Ashley Royalty M.D.   On: 05/23/2017 23:09     Assessment/Plan: Left foot osteo s/p 5th ray amputation and partial cuboid amputation - now afebrile. Plan is unchanged to do 2 addn more weeks from discharge with ceftaz plus vanco with HD plus oral metronidazole.   - follow up with Dr [name] in clinic for wound check  - we will have the patient follow up with Dr [name] in 3 wks.  [facility] for Infectious Diseases Cell: [phone] Pager: [phone]  05/25/2017, 2:57 PM

## 2017-05-25 NOTE — Care Management Note (Signed)
Case Management Note  Patient Details  Name: [name]. MRN: [id number] Date of Birth: 04-29-1951  Subjective/Objective:           CM following for progression and d/c planning.          Action/Plan: 05/25/2017 Met with pt and wife, pt for d/c to home today. Pt selected [facility] for [facility] services. [facility] notified. DME to be provided by [facility] and delivered to room prior to d/c.   Expected Discharge Date:  05/25/17               Expected Discharge Plan:  [place]  In-House Referral:  NA  Discharge planning Services  CM Consult  Post Acute Care Choice:  Durable Medical Equipment, Home Health Choice offered to:  Patient  DME Arranged:  Walker rolling, 3-N-1 DME Agency:  [place]:  PT [place]:  [facility] (now [facility])  Status of Service:  Completed, signed off  If discussed at [place] of Stay Meetings, dates discussed:    Additional Comments:  Adron Bene, RN 05/25/2017, 1:26 PM

## 2017-05-25 NOTE — Progress Notes (Signed)
Pt to discharge home, discharge instructions, medications and follow up appointments discussed and reviewed with pt and wife at bedside, verbalized understanding. Pt walker and 3:1 BSC delivered at bedside by [facility]. Iv dc'ed, catheter intact, site clean and dry. Pt was escorted out of the unit in wheelchair.

## 2017-05-25 NOTE — Discharge Summary (Signed)
Physician Discharge Summary  [name] Sr. ESP:[id number] DOB: [birth year] DOA: 05/16/2017  PCP: [name], MD  Admit date: 05/16/2017 Discharge date: 05/25/2017  Admitted From: Home Disposition: Home   Recommendations for Outpatient Follow-up:  1. Follow up with orthopedics, Dr. [name] as scheduled 2. Follow up with Dr. [name] in 3 weeks. 3. Continue hemodialysis as scheduled. Treat for additional 2 wk post discharge with vancomycin plus ceftaz with hemodialysis, plus provide oral metronidazole 53m TID during this time period-- for "mop up" residual disease on remaining cuboid bone. 4. Primidone started for essential tremor, follow up.   Home Health: PT Equipment/Devices: Rolling walker, 3 in 1 Discharge Condition: Stable CODE STATUS: Full Diet recommendation: Renal  Brief/Interim Summary: Patient is a 66year old male with diabetes, ESRD, MWF, CAD, anemia, hypertension, systolic CHF presented to ED from ID clinic with worsening left foot pain and increasing drainage from a nonhealing foot ulcers. Per patient he had developed ulcers from the lateral aspect of his left foot after having his fourth toe amputated. For the last 3 months he had received vancomycin and cefazolin at dialysis and was recently transitioned to doxycycline in the ID clinic. In ED, he had a temp of 101.12F.  The patient underwent a left fifth ray amputation and excision on 5/23 however postoperatively continued to spike fevers. ID recommended continuing 2 weeks of vanc/ceftazidime and flagyl.   Discharge Diagnoses:  Principal Problem:   Osteomyelitis of left foot ([facility] Active Problems:   Anemia due to end stage renal disease (HCC)   Cardiomegaly   Benign essential HTN   Congestive dilated cardiomyopathy (HCC)   ESRD on dialysis ([place]   Peripheral arterial disease ([facility]   ESRD on hemodialysis ([facility]   Diabetes mellitus with complication ([facility]  Osteomyelitis of left foot ([place] With nonhealing left  foot ulcers possibly due to diabetic foot ulcers/Diabetes mellitus and underlying PAD - Patient has a history of osteomyelitis in his right foot. Patient received antibiotics via hemodialysis for several weeks prior to admission and was recently transitioned to oral doxycycline. - MRI of the left foot showed soft tissue ulcer overlying the base of the fifth metatarsal osteomyelitis of the base of the fifth metatarsal along the lateral margin, no abscess. - Patient underwent left fifth ray amputation and excision on 5/23 - Appreciate ID recommendations, continue vancomycin, added Fortaz and Flagyl by Dr. VDrucilla Schmidton 5/27. - Blood cultures negative to date, wound cultures growing diphtheroids. - MRI of the foot showed no new areas of bone loss to suggest osteomyelitis Doppler ultrasounds of the upper or lower extremities negative  Active Problems:   Anemia due to ESRD/chronic disease - H&H currently stable     Benign essential HTN - BP stable     ESRD on dialysis ([facility] - Patient on hemodialysis MWF, nephrology following    Peripheral arterial disease ([place]  - Currently stable    Diabetes mellitus with complication ([facility] - [id number], cont sliding scale insulin    Essential tremors - Patient reported that beta blockers did not really help and also has borderline hypotension.  - Started on primidone, per his renal function, appreciate pharmacy to adjust his dose  Discharge Instructions Discharge Instructions    Discharge instructions    Complete by:  As directed    You were admitted for an infection in the left foot which has been treated with partial amputation and antibiotics. You are stable for discharge with the following recommendations:  - Continue taking home medications as you  were and ADD FLAGYL 590m every 8 hours for 2 weeks. You will also receive antibiotics with hemodialysis for 2 weeks.  - Follow up with Dr. GBerenice Primasas scheduled and with Dr HJohnnye Simaof infectious disease  in 3 weeks. If you have not heard from Dr. HAlgis Downsoffice by early next week, call their office.  - If you experience fever or pain at the site, seek medical attention sooner.   Weight bearing as tolerated    Complete by:  As directed    Laterality:  left   Extremity:  Lower   Putting weight on heel with post op shoe on.     Allergies as of 05/25/2017      Reactions   Penicillins Hives, Swelling   Ankle swelling PATIENT HAD A PCN REACTION WITH IMMEDIATE RASH, FACIAL/TONGUE/THROAT SWELLING, SOB, OR LIGHTHEADEDNESS WITH HYPOTENSION:  #  #  #  YES  #  #  #   Has patient had a PCN reaction causing severe rash involving mucus membranes or skin necrosis: no Has patient had a PCN reaction that required hospitalization: no Has patient had a PCN reaction occurring within the last 10 years: no If all of the above answers are "NO", then may proceed with Cephalosporin use.      Medication List    TAKE these medications   acetaminophen 325 MG tablet Commonly known as:  TYLENOL Take 650 mg by mouth every 6 (six) hours as needed (pain).   AURYXIA 1 GM 210 MG(Fe) tablet Generic drug:  ferric citrate Take 210 mg by mouth 3 (three) times daily with meals.   carvedilol 3.125 MG tablet Commonly known as:  COREG Take 1 tablet (3.125 mg total) by mouth 2 (two) times daily.   cinacalcet 90 MG tablet Commonly known as:  SENSIPAR Take 90 mg by mouth daily with breakfast.   glucose monitoring kit monitoring kit 1 each by Does not apply route 4 (four) times daily - after meals and at bedtime. 1 month Diabetic Testing Supplies for QAC-QHS accuchecks.Any brand OK   HYDROcodone-acetaminophen 5-325 MG tablet Commonly known as:  NORCO/VICODIN Take 1-2 tablets by mouth every 6 (six) hours as needed for moderate pain.   insulin aspart 100 UNIT/ML injection Commonly known as:  novoLOG Before each meal 3 times a day, 140-199 - 2 units, 200-250 - 4 units, 251-299 - 6 units,  300-349 - 8 units,  350 or  above 10 units. Insulin PEN if approved, provide syringes and needles if needed. What changed:  how much to take  how to take this  when to take this  reasons to take this  additional instructions   Insulin Syringe-Needle U-100 25G X 1" 1 ML Misc Any brand, for 4 times a day insulin SQ, 1 month supply.   metroNIDAZOLE 500 MG tablet Commonly known as:  FLAGYL Take 1 tablet (500 mg total) by mouth every 8 (eight) hours.   multivitamin Tabs tablet Take 1 tablet by mouth at bedtime.   mupirocin ointment 2 % Commonly known as:  BACTROBAN Apply 1 application topically daily as needed (rash on stomach).   VITAMIN C PO Take 1 tablet by mouth daily.      Follow-up Information    GDorna Leitz MD. Schedule an appointment as soon as possible for a visit in 10 day(s).   Specialty:  Orthopedic Surgery Why:  Appointment date on 05/28/2017 at 10:00am. Thank you.  Contact information: [phone]  [name], MD. Schedule an appointment as soon as possible for a visit in 2 week(s).   Specialty:  Family Medicine Contact information: [place] [address] [phone]        Campbell Riches, MD Follow up in 3 week(s).   Specialty:  Infectious Diseases Contact information: [address] [place] [address] [phone]          Allergies  Allergen Reactions  . Penicillins Hives and Swelling    Ankle swelling  PATIENT HAD A PCN REACTION WITH IMMEDIATE RASH, FACIAL/TONGUE/THROAT SWELLING, SOB, OR LIGHTHEADEDNESS WITH HYPOTENSION:  #  #  #  YES  #  #  #   Has patient had a PCN reaction causing severe rash involving mucus membranes or skin necrosis: no Has patient had a PCN reaction that required hospitalization: no Has patient had a PCN reaction occurring within the last 10 years: no If all of the above answers are "NO", then may proceed with Cephalosporin use.    Consultations:   ID Orthopedics  Procedures/Studies: Dg Chest 2 View  Result Date: 05/21/2017 CLINICAL DATA:  Peripheral vascular disease, diabetic neuropathy, fever, recent amputation LEFT foot, hypertension, cardiomyopathy EXAM: CHEST  2 VIEW COMPARISON:  05/18/2017 FINDINGS: Enlargement of cardiac silhouette. Atherosclerotic calcification aorta. Pulmonary vascular congestion. Lungs clear. No pleural effusion or pneumothorax. Bones unremarkable. IMPRESSION: Enlargement of cardiac silhouette with pulmonary vascular congestion. Aortic atherosclerosis. Electronically Signed   By: Lavonia Dana M.D.   On: 05/21/2017 12:17   Mr Foot Left Wo Contrast  Result Date: 05/23/2017 CLINICAL DATA:  Fever of unknown origin post fifth ray amputation. EXAM: MRI OF THE LEFT FOOT WITHOUT CONTRAST TECHNIQUE: Multiplanar, multisequence MR imaging of the left forefoot was performed. No intravenous contrast was administered. COMPARISON:  None. FINDINGS: Bones/Joint/Cartilage Postop soft tissue change overlying new fifth ray amputation. Chronic fourth phalangeal amputation. No new findings of osteomyelitis. No abscess. Mild reactive edema across the calcaneocuboid articulation and base of fourth metatarsal. Osteoarthritic change across the first MTP and navicular- medial cuneiform. Ligaments Intact and noncontributory Muscles and Tendons No evidence of pyomyositis. Generalized muscle atrophy. The visualized flexor and extensor tendons are intact. Soft tissues No fluid collection or hematoma.  No soft tissue mass. IMPRESSION: 1. Status post left fifth ray amputation for osteomyelitis. 2. Amputated fourth phalanx. 3. No new areas of cortical bone loss, edema nor soft tissue ulceration to suggest osteomyelitis. Electronically Signed   By: Ashley Royalty M.D.   On: 05/23/2017 23:09   Mr Foot Left Wo Contrast  Result Date: 05/17/2017 CLINICAL DATA:  Nonhealing ulcer involving the left foot. EXAM: MRI OF THE LEFT FOOT WITHOUT CONTRAST TECHNIQUE:  Multiplanar, multisequence MR imaging of the left forefoot was performed. No intravenous contrast was administered. COMPARISON:  None. FINDINGS: Bones/Joint/Cartilage Soft tissue ulcer overlying the base of the fifth metatarsal. Ulcer extends to the cortex of the base of the fifth metatarsal along the lateral margin with cortical irregularity, underlying bone marrow edema and T1 hypointensity most consistent with osteomyelitis. Amputation of the fourth phalanx. No acute fracture or dislocation. Normal alignment. No joint effusion. Mild osteoarthritis of the first MTP joint. Mild osteoarthritis of the second MTP joint. Mild osteoarthritis of the navicular- medial cuneiform joint. Ligaments Collateral ligaments are intact.  Lisfranc ligament is intact. Muscles and Tendons Flexor, peroneal and extensor compartment tendons are intact. Generalized muscle atrophy. Soft tissue No fluid collection or hematoma. No soft tissue mass. Soft tissue edema along the lateral aspect  of the foot overlying the fifth metatarsal consistent with cellulitis. IMPRESSION: 1. Soft tissue ulcer overlying the base of the fifth metatarsal. 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 No drainable fluid collection to suggest an abscess. Electronically Signed   By: Kathreen Devoid   On: 05/17/2017 08:15   Dg Chest Port 1 View  Result Date: 05/18/2017 CLINICAL DATA:  Shortness of Breath EXAM: PORTABLE CHEST 1 VIEW COMPARISON:  09/11/2016 FINDINGS: Cardiac shadow is mildly enlarged. The lungs are well aerated bilaterally. No focal infiltrate or sizable effusion is noted. No bony abnormality is noted IMPRESSION: No active disease. Electronically Signed   By: Inez Catalina M.D.   On: 05/18/2017 07:58   Dg Foot Complete Left  Result Date: 05/21/2017 CLINICAL DATA:  Peripheral vascular disease, diabetic neuropathy, LEFT toe amputation, irrigation, debridement, fever, history hypertension, cardiomyopathy EXAM: LEFT FOOT  - COMPLETE 3+ VIEW COMPARISON:  05/16/2017 FINDINGS: Interval amputation of the fifth ray soft tissue swelling, skin clips and overlying dressing artifacts. Prior amputation of fourth toe. Diffuse osseous demineralization. Old healed fracture of the proximal phalanx great toe. No acute fracture or dislocation. Cortical margination of the lateral base of the fourth metatarsal and the lateral margin of the cuboid appears indistinct, could be related to surgery but cannot exclude bone destruction / osteomyelitis. Scattered small vessel vascular calcifications. IMPRESSION: Interval amputation of the fifth ray. Question cortical loss at the lateral base of the fourth metatarsal at the lateral margin of the cuboid, question related to preceding surgery but unable to exclude subtle bone destruction/osteomyelitis. Electronically Signed   By: Lavonia Dana M.D.   On: 05/21/2017 12:16   Dg Foot Complete Left  Result Date: 05/16/2017 CLINICAL DATA:  Left foot soft tissue wound with drainage and pain and fevers, initial encounter EXAM: LEFT FOOT - COMPLETE 3+ VIEW COMPARISON:  05/03/2017 FINDINGS: Degenerative changes at first MTP joint are noted. Soft tissue defect is again noted at the distal aspect of the fifth toe as well as laterally in the foot. There are some erosive changes identified at the base of the fifth metatarsal which are new from the prior exam consistent with osteomyelitis. Chronic changes in the heads of the second and fourth metatarsals are seen. Changes of prior fourth toe amputation are again noted. IMPRESSION: New area of lucency at the base of the fifth metatarsal. This is consistent with underlying osteomyelitis. Electronically Signed   By: Inez Catalina M.D.   On: 05/16/2017 14:14   Dg Foot Complete Left  Result Date: 05/03/2017 CLINICAL DATA:  66 year old male with chronic left foot wounds EXAM: LEFT FOOT - COMPLETE 3+ VIEW COMPARISON:  Prior radiographs of the left foot 01/04/2017 FINDINGS:  Stable postsurgical changes of prior fourth digit amputation. Sequelae of prior Freiberg's infraction at the head of the second metatarsal are similar compared to prior. No evidence of new bony refraction or due destruction to suggest acute osteomyelitis. Soft tissue irregularity present at the tip of the fifth toe, and laterally adjacent to the base of the fifth metatarsal consistent with ulcerations. Calcifications are present of the digital arteries bilaterally. Degenerative osteoarthritis present in the great toe MTP joint. IMPRESSION: 1. No convincing conventional radiographic evidence of active osteomyelitis. 2. Active soft tissue ulceration at the tip of the fifth digit, and at the lateral aspect of the foot adjacent the base of the fifth metatarsal. 3. Surgical changes of prior fourth toe amputation. 4. 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 5. Small vessel atherosclerotic vascular calcifications. 6. 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 Electronically Signed   By: Jacqulynn Cadet M.D.   On: 05/03/2017 14:23    Subjective: No pain, fever, chills, or other complaints.   Discharge Exam: Vitals:   05/25/17 1040 05/25/17 1101  BP: (!) 106/55 (!) 153/74  Pulse: 73 75  Resp:    Temp:     General: Pt is alert, awake, not in acute distress Cardiovascular: RRR, S1/S2 +, no rubs, no gallops Respiratory: CTA bilaterally, no wheezing, no rhonchi Abdominal: Soft, NT, ND, bowel sounds + Extremities: Left foot surgical site of lateral foot is c/d/i except distal incision slight swelling not approximating, otherwise the entire incision is held together with staples.  Skin: Superficial blister has dried on dorsal aspect of foot  Labs: Basic Metabolic Panel:  Recent Labs Lab 05/19/17 0413 05/20/17 0815 05/23/17 0743 05/25/17 0746  NA 134* 132* 135 132*  K 4.9 4.4 4.5 4.4  CL 94* 95* 96* 93*  CO2  26 21* 23 22  GLUCOSE 156* 113* 148* 155*  BUN 33* 51* 61* 61*  CREATININE 9.76* 13.21* 14.08* 12.01*  CALCIUM 8.0* 7.9* 8.7* 9.0  PHOS 4.9* 5.5* 7.2* 7.1*   Liver Function Tests:  Recent Labs Lab 05/19/17 0413 05/20/17 0815 05/23/17 0743 05/25/17 0746  ALBUMIN 2.9* 2.8* 2.5* 2.6*   CBC:  Recent Labs Lab 05/19/17 0413 05/20/17 0815 05/23/17 0743 05/25/17 0745  WBC 8.7 10.3 9.1 8.2  HGB 12.3* 12.1* 11.7* 11.6*  HCT 39.1 37.3* 35.7* 36.1*  MCV 96.3 93.5 92.2 93.0  PLT 258 251 355 389   Microbiology Recent Results (from the past 240 hour(s))  Wound or Superficial Culture     Status: Abnormal   Collection Time: 05/16/17  2:26 PM  Result Value Ref Range Status   Specimen Description WOUND ULCER  Final   Special Requests NONE  Final   Gram Stain   Final    MODERATE WBC PRESENT, PREDOMINANTLY PMN FEW GRAM POSITIVE COCCI RARE GRAM NEGATIVE RODS    Culture MULTIPLE ORGANISMS PRESENT, NONE PREDOMINANT (A)  Final   Report Status 05/19/2017 FINAL  Final  MRSA PCR Screening     Status: None   Collection Time: 05/16/17 10:11 PM  Result Value Ref Range Status   MRSA by PCR NEGATIVE NEGATIVE Final    Comment:        The GeneXpert MRSA Assay (FDA approved for NASAL specimens only), is one component of a comprehensive MRSA colonization surveillance program. It is not intended to diagnose MRSA infection nor to guide or monitor treatment for MRSA infections.   Surgical pcr screen     Status: None   Collection Time: 05/17/17  9:57 PM  Result Value Ref Range Status   MRSA, PCR NEGATIVE NEGATIVE Final   Staphylococcus aureus NEGATIVE NEGATIVE Final    Comment:        The Xpert SA Assay (FDA approved for NASAL specimens in patients over 15 years of age), is one component of a comprehensive surveillance program.  Test performance has been validated by [facility] for patients greater than or equal to 66 year old. It is not intended to diagnose infection nor to guide  or monitor treatment.   Culture, blood (routine x 2)     Status: None (Preliminary result)   Collection Time: 05/19/17 10:10 PM  Result Value Ref Range Status   Specimen Description  BLOOD RIGHT ARM  Final   Special Requests   Final    BOTTLES DRAWN AEROBIC AND ANAEROBIC Blood Culture adequate volume   Culture NO GROWTH 4 DAYS  Final   Report Status PENDING  Incomplete  Culture, blood (routine x 2)     Status: None (Preliminary result)   Collection Time: 05/19/17 10:17 PM  Result Value Ref Range Status   Specimen Description BLOOD RIGHT HAND  Final   Special Requests   Final    BOTTLES DRAWN AEROBIC AND ANAEROBIC Blood Culture adequate volume   Culture NO GROWTH 4 DAYS  Final   Report Status PENDING  Incomplete    Time coordinating discharge: Approximately 40 minutes  Vance Gather, MD  [facility] 05/25/2017, 11:12 AM Pager [phone]

## 2017-05-25 NOTE — Progress Notes (Addendum)
This is a Procedure note: Stable with improvement clinically.  No hemodynamic issues currentl;y on dialysis. Antibiotic regimen outlined in Dr Storm Frisk note. [name]

## 2017-05-25 NOTE — Progress Notes (Signed)
Pharmacy Antibiotic Note  [name]. is a 66 y.o. male with ERSD admitted on 05/16/2017 with osteomyelitis The patient is s/p a 5th ray amputation and partial excision of the cuboid bone on 5/23. Given post-op fevers, ID is planning to treat for 2 weeks post-discharge to cover any residual infection. Pharmacy is on board for Vancomycin and Ceftazidime dosing.   Plan: 1. Continue Vancomycin 1g IV post HD-MWF 2. Continue Ceftazidime 2g IV on MWF @ 1800 3. Continue Flagyl per MD 4. If still here - will plan to check a 4hr post-HD Vancomycin random level today to ensure therapeutic 5. Will continue to follow HD schedule/duration, culture results, LOT, and antibiotic de-escalation plans   Height: 6\' 1"  (185.4 cm) Weight: 203 lb 0.7 oz (92.1 kg) (Standing Weight) IBW/kg (Calculated) : 79.9  Temp (24hrs), Avg:98.2 F (36.8 C), Min:97 F (36.1 C), Max:98.7 F (37.1 C)   Recent Labs Lab 05/19/17 0413 05/20/17 0815 05/23/17 0743 05/25/17 0745 05/25/17 0746  WBC 8.7 10.3 9.1 8.2  --   CREATININE 9.76* 13.21* 14.08*  --  12.01*    Estimated Creatinine Clearance: 6.9 mL/min (A) (by C-G formula based on SCr of 12.01 mg/dL (H)).    Allergies  Allergen Reactions  . Penicillins Hives and Swelling    Ankle swelling  PATIENT HAD A PCN REACTION WITH IMMEDIATE RASH, FACIAL/TONGUE/THROAT SWELLING, SOB, OR LIGHTHEADEDNESS WITH HYPOTENSION:  #  #  #  YES  #  #  #   Has patient had a PCN reaction causing severe rash involving mucus membranes or skin necrosis: no Has patient had a PCN reaction that required hospitalization: no Has patient had a PCN reaction occurring within the last 10 years: no If all of the above answers are "NO", then may proceed with Cephalosporin use.    Antimicrobials this admission: 5/21 vanc>> (6/13) 5/21 ceftazidime>>5/25; 5/27>> (6/13) 5/25 meropenem>>5/27 5/27 Flagyl>> (6/13)  Microbiology results: 5/15 WCx: multiple organisms, moderate bacteroides 5/21  WCx: multiple organisms 5/22 MRSA PCR: neg 5/24 BCx: ngtd  Thank you for allowing pharmacy to be a part of this patient's care.  Alycia Rossetti, PharmD, BCPS Clinical Pharmacist Pager: [phone] Clinical phone for 05/25/2017 from 7a-3:30p: [phone] If after 3:30p, please call main pharmacy at: [phone] 05/25/2017 12:01 PM

## 2017-05-26 DIAGNOSIS — E1122 Type 2 diabetes mellitus with diabetic chronic kidney disease: Secondary | ICD-10-CM | POA: Diagnosis not present

## 2017-05-26 DIAGNOSIS — Z992 Dependence on renal dialysis: Secondary | ICD-10-CM | POA: Diagnosis not present

## 2017-05-26 DIAGNOSIS — N186 End stage renal disease: Secondary | ICD-10-CM | POA: Diagnosis not present

## 2017-05-27 DIAGNOSIS — E1121 Type 2 diabetes mellitus with diabetic nephropathy: Secondary | ICD-10-CM | POA: Diagnosis not present

## 2017-05-27 DIAGNOSIS — N2581 Secondary hyperparathyroidism of renal origin: Secondary | ICD-10-CM | POA: Diagnosis not present

## 2017-05-27 DIAGNOSIS — M868X7 Other osteomyelitis, ankle and foot: Secondary | ICD-10-CM | POA: Diagnosis not present

## 2017-05-27 DIAGNOSIS — N186 End stage renal disease: Secondary | ICD-10-CM | POA: Diagnosis not present

## 2017-05-27 DIAGNOSIS — D631 Anemia in chronic kidney disease: Secondary | ICD-10-CM | POA: Diagnosis not present

## 2017-05-28 DIAGNOSIS — S91301A Unspecified open wound, right foot, initial encounter: Secondary | ICD-10-CM | POA: Diagnosis not present

## 2017-05-30 DIAGNOSIS — N186 End stage renal disease: Secondary | ICD-10-CM | POA: Diagnosis not present

## 2017-05-30 DIAGNOSIS — E1121 Type 2 diabetes mellitus with diabetic nephropathy: Secondary | ICD-10-CM | POA: Diagnosis not present

## 2017-05-30 DIAGNOSIS — D631 Anemia in chronic kidney disease: Secondary | ICD-10-CM | POA: Diagnosis not present

## 2017-05-30 DIAGNOSIS — N2581 Secondary hyperparathyroidism of renal origin: Secondary | ICD-10-CM | POA: Diagnosis not present

## 2017-05-30 DIAGNOSIS — M868X7 Other osteomyelitis, ankle and foot: Secondary | ICD-10-CM | POA: Diagnosis not present

## 2017-06-01 DIAGNOSIS — M868X7 Other osteomyelitis, ankle and foot: Secondary | ICD-10-CM | POA: Diagnosis not present

## 2017-06-01 DIAGNOSIS — N2581 Secondary hyperparathyroidism of renal origin: Secondary | ICD-10-CM | POA: Diagnosis not present

## 2017-06-01 DIAGNOSIS — E1121 Type 2 diabetes mellitus with diabetic nephropathy: Secondary | ICD-10-CM | POA: Diagnosis not present

## 2017-06-01 DIAGNOSIS — N186 End stage renal disease: Secondary | ICD-10-CM | POA: Diagnosis not present

## 2017-06-01 DIAGNOSIS — D631 Anemia in chronic kidney disease: Secondary | ICD-10-CM | POA: Diagnosis not present

## 2017-06-03 DIAGNOSIS — N186 End stage renal disease: Secondary | ICD-10-CM | POA: Diagnosis not present

## 2017-06-03 DIAGNOSIS — E1121 Type 2 diabetes mellitus with diabetic nephropathy: Secondary | ICD-10-CM | POA: Diagnosis not present

## 2017-06-03 DIAGNOSIS — N2581 Secondary hyperparathyroidism of renal origin: Secondary | ICD-10-CM | POA: Diagnosis not present

## 2017-06-03 DIAGNOSIS — M868X7 Other osteomyelitis, ankle and foot: Secondary | ICD-10-CM | POA: Diagnosis not present

## 2017-06-03 DIAGNOSIS — D631 Anemia in chronic kidney disease: Secondary | ICD-10-CM | POA: Diagnosis not present

## 2017-06-06 DIAGNOSIS — M868X7 Other osteomyelitis, ankle and foot: Secondary | ICD-10-CM | POA: Diagnosis not present

## 2017-06-06 DIAGNOSIS — N186 End stage renal disease: Secondary | ICD-10-CM | POA: Diagnosis not present

## 2017-06-06 DIAGNOSIS — D631 Anemia in chronic kidney disease: Secondary | ICD-10-CM | POA: Diagnosis not present

## 2017-06-06 DIAGNOSIS — N2581 Secondary hyperparathyroidism of renal origin: Secondary | ICD-10-CM | POA: Diagnosis not present

## 2017-06-06 DIAGNOSIS — E1121 Type 2 diabetes mellitus with diabetic nephropathy: Secondary | ICD-10-CM | POA: Diagnosis not present

## 2017-06-08 DIAGNOSIS — N186 End stage renal disease: Secondary | ICD-10-CM | POA: Diagnosis not present

## 2017-06-08 DIAGNOSIS — N2581 Secondary hyperparathyroidism of renal origin: Secondary | ICD-10-CM | POA: Diagnosis not present

## 2017-06-08 DIAGNOSIS — M868X7 Other osteomyelitis, ankle and foot: Secondary | ICD-10-CM | POA: Diagnosis not present

## 2017-06-08 DIAGNOSIS — E1121 Type 2 diabetes mellitus with diabetic nephropathy: Secondary | ICD-10-CM | POA: Diagnosis not present

## 2017-06-08 DIAGNOSIS — D631 Anemia in chronic kidney disease: Secondary | ICD-10-CM | POA: Diagnosis not present

## 2017-06-09 DIAGNOSIS — Z9889 Other specified postprocedural states: Secondary | ICD-10-CM | POA: Diagnosis not present

## 2017-06-10 DIAGNOSIS — D631 Anemia in chronic kidney disease: Secondary | ICD-10-CM | POA: Diagnosis not present

## 2017-06-10 DIAGNOSIS — E1121 Type 2 diabetes mellitus with diabetic nephropathy: Secondary | ICD-10-CM | POA: Diagnosis not present

## 2017-06-10 DIAGNOSIS — N186 End stage renal disease: Secondary | ICD-10-CM | POA: Diagnosis not present

## 2017-06-10 DIAGNOSIS — M868X7 Other osteomyelitis, ankle and foot: Secondary | ICD-10-CM | POA: Diagnosis not present

## 2017-06-10 DIAGNOSIS — N2581 Secondary hyperparathyroidism of renal origin: Secondary | ICD-10-CM | POA: Diagnosis not present

## 2017-06-13 DIAGNOSIS — N186 End stage renal disease: Secondary | ICD-10-CM | POA: Diagnosis not present

## 2017-06-13 DIAGNOSIS — N2581 Secondary hyperparathyroidism of renal origin: Secondary | ICD-10-CM | POA: Diagnosis not present

## 2017-06-13 DIAGNOSIS — E1121 Type 2 diabetes mellitus with diabetic nephropathy: Secondary | ICD-10-CM | POA: Diagnosis not present

## 2017-06-13 DIAGNOSIS — D631 Anemia in chronic kidney disease: Secondary | ICD-10-CM | POA: Diagnosis not present

## 2017-06-13 DIAGNOSIS — M868X7 Other osteomyelitis, ankle and foot: Secondary | ICD-10-CM | POA: Diagnosis not present

## 2017-06-15 ENCOUNTER — Encounter (HOSPITAL_BASED_OUTPATIENT_CLINIC_OR_DEPARTMENT_OTHER): Payer: Medicare Other | Attending: Surgery

## 2017-06-15 DIAGNOSIS — L97522 Non-pressure chronic ulcer of other part of left foot with fat layer exposed: Secondary | ICD-10-CM | POA: Diagnosis not present

## 2017-06-15 DIAGNOSIS — E11621 Type 2 diabetes mellitus with foot ulcer: Secondary | ICD-10-CM | POA: Diagnosis not present

## 2017-06-15 DIAGNOSIS — E1140 Type 2 diabetes mellitus with diabetic neuropathy, unspecified: Secondary | ICD-10-CM | POA: Diagnosis not present

## 2017-06-15 DIAGNOSIS — N186 End stage renal disease: Secondary | ICD-10-CM | POA: Diagnosis not present

## 2017-06-15 DIAGNOSIS — E1122 Type 2 diabetes mellitus with diabetic chronic kidney disease: Secondary | ICD-10-CM | POA: Insufficient documentation

## 2017-06-15 DIAGNOSIS — L89899 Pressure ulcer of other site, unspecified stage: Secondary | ICD-10-CM | POA: Diagnosis not present

## 2017-06-15 DIAGNOSIS — I120 Hypertensive chronic kidney disease with stage 5 chronic kidney disease or end stage renal disease: Secondary | ICD-10-CM | POA: Insufficient documentation

## 2017-06-15 DIAGNOSIS — Z89422 Acquired absence of other left toe(s): Secondary | ICD-10-CM | POA: Diagnosis not present

## 2017-06-15 DIAGNOSIS — L97512 Non-pressure chronic ulcer of other part of right foot with fat layer exposed: Secondary | ICD-10-CM | POA: Insufficient documentation

## 2017-06-15 DIAGNOSIS — Z992 Dependence on renal dialysis: Secondary | ICD-10-CM | POA: Insufficient documentation

## 2017-06-16 ENCOUNTER — Encounter: Payer: Self-pay | Admitting: Infectious Diseases

## 2017-06-16 ENCOUNTER — Ambulatory Visit (INDEPENDENT_AMBULATORY_CARE_PROVIDER_SITE_OTHER): Payer: Medicare Other | Admitting: Infectious Diseases

## 2017-06-16 DIAGNOSIS — M86271 Subacute osteomyelitis, right ankle and foot: Secondary | ICD-10-CM

## 2017-06-16 DIAGNOSIS — E11621 Type 2 diabetes mellitus with foot ulcer: Secondary | ICD-10-CM

## 2017-06-16 DIAGNOSIS — M86572 Other chronic hematogenous osteomyelitis, left ankle and foot: Secondary | ICD-10-CM

## 2017-06-16 DIAGNOSIS — L97529 Non-pressure chronic ulcer of other part of left foot with unspecified severity: Secondary | ICD-10-CM | POA: Diagnosis not present

## 2017-06-16 NOTE — Assessment & Plan Note (Deleted)
Will see him back in 6 weeks.  Will stop flagyl Do not feel that he needs further anbx at this point.  He will f/u with Dr [name] and [place] I have asked them (he and his wife) to call me if there are any changes to his wound- d/c, odor.

## 2017-06-16 NOTE — Assessment & Plan Note (Signed)
He will f/u with his PCP.  His FSG appear to be well controlled.

## 2017-06-16 NOTE — Assessment & Plan Note (Signed)
Will see him back in 6 weeks.  Will stop flagyl Do not feel that he needs further anbx at this point.  He will f/u with Dr [name] and Harris I have asked them (he and his wife) to call me if there are any changes to his wound- d/c, odor.

## 2017-06-16 NOTE — Assessment & Plan Note (Signed)
His wound appears to be healing well.  Appreciate WOC f/u.

## 2017-06-16 NOTE — Progress Notes (Signed)
   Subjective:    Patient ID: [name] Sages Sr., male    DOB: December 02, 1951, 66 y.o.   MRN: [id number]  HPI 66 yo M with hx of DM2, ESRD, CAD, and ulcers on R first metatarsal head; L 5th and L 1st MT heads.  He was seen in ID on 5-21 with worsening of his R 5th ray and was sent to hospital. His wound Cx was polymicrobial.  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 His BCx were negative.  He was kept on vanco/ceftaz/flagyl for 2 weeks post-op.   Today he states he still has d/c from his foot. Per pt.  Has had ortho f/u- 6-12.  Has had wound care f/u.  Completed anbx at HD, still taking flagyl.   FSG have been 109-150.    Review of Systems  Constitutional: Positive for appetite change and unexpected weight change. Negative for chills and fever.  Gastrointestinal: Negative for constipation and diarrhea.  Genitourinary: Negative for difficulty urinating.  Musculoskeletal: Negative for myalgias.  Skin: Positive for wound.  using a cane now, ability to bare wt is back to normal (cane is for balance). 0     Objective:   Physical Exam  Constitutional: He appears well-developed and well-nourished.  HENT:  Mouth/Throat: No oropharyngeal exudate.  Eyes: EOM are normal. Pupils are equal, round, and reactive to light.  Neck: Neck supple.  Cardiovascular: Normal rate, regular rhythm and normal heart sounds.   Pulmonary/Chest: Effort normal and breath sounds normal.  Abdominal: Soft. Bowel sounds are normal. There is no tenderness. There is no rebound.  Lymphadenopathy:    He has no cervical adenopathy.         His L wound has non d/c or increase in heat. There is no lfuctuance. No pain.  R foot is clean, there is msmall wound from a previous blister.     Assessment & Plan:

## 2017-06-17 DIAGNOSIS — E1121 Type 2 diabetes mellitus with diabetic nephropathy: Secondary | ICD-10-CM | POA: Diagnosis not present

## 2017-06-17 DIAGNOSIS — N2581 Secondary hyperparathyroidism of renal origin: Secondary | ICD-10-CM | POA: Diagnosis not present

## 2017-06-17 DIAGNOSIS — M868X7 Other osteomyelitis, ankle and foot: Secondary | ICD-10-CM | POA: Diagnosis not present

## 2017-06-17 DIAGNOSIS — N186 End stage renal disease: Secondary | ICD-10-CM | POA: Diagnosis not present

## 2017-06-17 DIAGNOSIS — D631 Anemia in chronic kidney disease: Secondary | ICD-10-CM | POA: Diagnosis not present

## 2017-06-20 DIAGNOSIS — N2581 Secondary hyperparathyroidism of renal origin: Secondary | ICD-10-CM | POA: Diagnosis not present

## 2017-06-20 DIAGNOSIS — M868X7 Other osteomyelitis, ankle and foot: Secondary | ICD-10-CM | POA: Diagnosis not present

## 2017-06-20 DIAGNOSIS — D631 Anemia in chronic kidney disease: Secondary | ICD-10-CM | POA: Diagnosis not present

## 2017-06-20 DIAGNOSIS — E1121 Type 2 diabetes mellitus with diabetic nephropathy: Secondary | ICD-10-CM | POA: Diagnosis not present

## 2017-06-20 DIAGNOSIS — N186 End stage renal disease: Secondary | ICD-10-CM | POA: Diagnosis not present

## 2017-06-21 DIAGNOSIS — I120 Hypertensive chronic kidney disease with stage 5 chronic kidney disease or end stage renal disease: Secondary | ICD-10-CM | POA: Diagnosis not present

## 2017-06-21 DIAGNOSIS — E1122 Type 2 diabetes mellitus with diabetic chronic kidney disease: Secondary | ICD-10-CM | POA: Diagnosis not present

## 2017-06-21 DIAGNOSIS — L97522 Non-pressure chronic ulcer of other part of left foot with fat layer exposed: Secondary | ICD-10-CM | POA: Diagnosis not present

## 2017-06-21 DIAGNOSIS — L89899 Pressure ulcer of other site, unspecified stage: Secondary | ICD-10-CM | POA: Diagnosis not present

## 2017-06-21 DIAGNOSIS — L97512 Non-pressure chronic ulcer of other part of right foot with fat layer exposed: Secondary | ICD-10-CM | POA: Diagnosis not present

## 2017-06-21 DIAGNOSIS — L97526 Non-pressure chronic ulcer of other part of left foot with bone involvement without evidence of necrosis: Secondary | ICD-10-CM | POA: Diagnosis not present

## 2017-06-21 DIAGNOSIS — E11621 Type 2 diabetes mellitus with foot ulcer: Secondary | ICD-10-CM | POA: Diagnosis not present

## 2017-06-22 DIAGNOSIS — N2581 Secondary hyperparathyroidism of renal origin: Secondary | ICD-10-CM | POA: Diagnosis not present

## 2017-06-22 DIAGNOSIS — E1121 Type 2 diabetes mellitus with diabetic nephropathy: Secondary | ICD-10-CM | POA: Diagnosis not present

## 2017-06-22 DIAGNOSIS — N186 End stage renal disease: Secondary | ICD-10-CM | POA: Diagnosis not present

## 2017-06-22 DIAGNOSIS — D631 Anemia in chronic kidney disease: Secondary | ICD-10-CM | POA: Diagnosis not present

## 2017-06-22 DIAGNOSIS — M868X7 Other osteomyelitis, ankle and foot: Secondary | ICD-10-CM | POA: Diagnosis not present

## 2017-06-23 DIAGNOSIS — Z992 Dependence on renal dialysis: Secondary | ICD-10-CM | POA: Diagnosis not present

## 2017-06-23 DIAGNOSIS — I519 Heart disease, unspecified: Secondary | ICD-10-CM | POA: Diagnosis not present

## 2017-06-23 DIAGNOSIS — E11621 Type 2 diabetes mellitus with foot ulcer: Secondary | ICD-10-CM | POA: Diagnosis not present

## 2017-06-23 DIAGNOSIS — Z9889 Other specified postprocedural states: Secondary | ICD-10-CM | POA: Diagnosis not present

## 2017-06-23 DIAGNOSIS — N186 End stage renal disease: Secondary | ICD-10-CM | POA: Diagnosis not present

## 2017-06-23 DIAGNOSIS — E1122 Type 2 diabetes mellitus with diabetic chronic kidney disease: Secondary | ICD-10-CM | POA: Diagnosis not present

## 2017-06-23 DIAGNOSIS — L97522 Non-pressure chronic ulcer of other part of left foot with fat layer exposed: Secondary | ICD-10-CM | POA: Diagnosis not present

## 2017-06-23 DIAGNOSIS — L97512 Non-pressure chronic ulcer of other part of right foot with fat layer exposed: Secondary | ICD-10-CM | POA: Diagnosis not present

## 2017-06-23 DIAGNOSIS — Z794 Long term (current) use of insulin: Secondary | ICD-10-CM | POA: Diagnosis not present

## 2017-06-24 DIAGNOSIS — M868X7 Other osteomyelitis, ankle and foot: Secondary | ICD-10-CM | POA: Diagnosis not present

## 2017-06-24 DIAGNOSIS — N186 End stage renal disease: Secondary | ICD-10-CM | POA: Diagnosis not present

## 2017-06-24 DIAGNOSIS — N2581 Secondary hyperparathyroidism of renal origin: Secondary | ICD-10-CM | POA: Diagnosis not present

## 2017-06-24 DIAGNOSIS — E1121 Type 2 diabetes mellitus with diabetic nephropathy: Secondary | ICD-10-CM | POA: Diagnosis not present

## 2017-06-24 DIAGNOSIS — D631 Anemia in chronic kidney disease: Secondary | ICD-10-CM | POA: Diagnosis not present

## 2017-06-25 DIAGNOSIS — E1122 Type 2 diabetes mellitus with diabetic chronic kidney disease: Secondary | ICD-10-CM | POA: Diagnosis not present

## 2017-06-25 DIAGNOSIS — Z992 Dependence on renal dialysis: Secondary | ICD-10-CM | POA: Diagnosis not present

## 2017-06-25 DIAGNOSIS — N186 End stage renal disease: Secondary | ICD-10-CM | POA: Diagnosis not present

## 2017-06-27 DIAGNOSIS — M868X7 Other osteomyelitis, ankle and foot: Secondary | ICD-10-CM | POA: Diagnosis not present

## 2017-06-27 DIAGNOSIS — N2581 Secondary hyperparathyroidism of renal origin: Secondary | ICD-10-CM | POA: Diagnosis not present

## 2017-06-27 DIAGNOSIS — E1121 Type 2 diabetes mellitus with diabetic nephropathy: Secondary | ICD-10-CM | POA: Diagnosis not present

## 2017-06-27 DIAGNOSIS — N186 End stage renal disease: Secondary | ICD-10-CM | POA: Diagnosis not present

## 2017-06-27 DIAGNOSIS — D631 Anemia in chronic kidney disease: Secondary | ICD-10-CM | POA: Diagnosis not present

## 2017-06-28 ENCOUNTER — Other Ambulatory Visit: Payer: Self-pay | Admitting: Certified Registered Nurse Anesthetist

## 2017-06-28 ENCOUNTER — Other Ambulatory Visit (HOSPITAL_COMMUNITY)
Admission: RE | Admit: 2017-06-28 | Discharge: 2017-06-28 | Disposition: A | Discharge status: Home / Self Care | Payer: Medicare Other | Source: Other Acute Inpatient Hospital | Attending: Surgery | Admitting: Surgery

## 2017-06-28 ENCOUNTER — Encounter (HOSPITAL_BASED_OUTPATIENT_CLINIC_OR_DEPARTMENT_OTHER): Payer: Medicare Other | Attending: Surgery

## 2017-06-28 DIAGNOSIS — E1122 Type 2 diabetes mellitus with diabetic chronic kidney disease: Secondary | ICD-10-CM | POA: Diagnosis not present

## 2017-06-28 DIAGNOSIS — N186 End stage renal disease: Secondary | ICD-10-CM | POA: Insufficient documentation

## 2017-06-28 DIAGNOSIS — E1151 Type 2 diabetes mellitus with diabetic peripheral angiopathy without gangrene: Secondary | ICD-10-CM | POA: Insufficient documentation

## 2017-06-28 DIAGNOSIS — Z992 Dependence on renal dialysis: Secondary | ICD-10-CM | POA: Diagnosis not present

## 2017-06-28 DIAGNOSIS — M86372 Chronic multifocal osteomyelitis, left ankle and foot: Secondary | ICD-10-CM | POA: Diagnosis not present

## 2017-06-28 DIAGNOSIS — L97512 Non-pressure chronic ulcer of other part of right foot with fat layer exposed: Secondary | ICD-10-CM | POA: Insufficient documentation

## 2017-06-28 DIAGNOSIS — E1140 Type 2 diabetes mellitus with diabetic neuropathy, unspecified: Secondary | ICD-10-CM | POA: Diagnosis not present

## 2017-06-28 DIAGNOSIS — I132 Hypertensive heart and chronic kidney disease with heart failure and with stage 5 chronic kidney disease, or end stage renal disease: Secondary | ICD-10-CM | POA: Diagnosis not present

## 2017-06-28 DIAGNOSIS — E11621 Type 2 diabetes mellitus with foot ulcer: Secondary | ICD-10-CM | POA: Diagnosis not present

## 2017-06-28 DIAGNOSIS — M86172 Other acute osteomyelitis, left ankle and foot: Secondary | ICD-10-CM | POA: Diagnosis not present

## 2017-06-28 DIAGNOSIS — L97522 Non-pressure chronic ulcer of other part of left foot with fat layer exposed: Secondary | ICD-10-CM | POA: Insufficient documentation

## 2017-06-28 DIAGNOSIS — I509 Heart failure, unspecified: Secondary | ICD-10-CM | POA: Diagnosis not present

## 2017-06-28 DIAGNOSIS — L97519 Non-pressure chronic ulcer of other part of right foot with unspecified severity: Secondary | ICD-10-CM | POA: Diagnosis not present

## 2017-06-28 DIAGNOSIS — L97526 Non-pressure chronic ulcer of other part of left foot with bone involvement without evidence of necrosis: Secondary | ICD-10-CM | POA: Diagnosis not present

## 2017-06-29 DIAGNOSIS — M868X7 Other osteomyelitis, ankle and foot: Secondary | ICD-10-CM | POA: Diagnosis not present

## 2017-06-29 DIAGNOSIS — N2581 Secondary hyperparathyroidism of renal origin: Secondary | ICD-10-CM | POA: Diagnosis not present

## 2017-06-29 DIAGNOSIS — N186 End stage renal disease: Secondary | ICD-10-CM | POA: Diagnosis not present

## 2017-06-29 DIAGNOSIS — E1121 Type 2 diabetes mellitus with diabetic nephropathy: Secondary | ICD-10-CM | POA: Diagnosis not present

## 2017-06-29 DIAGNOSIS — D631 Anemia in chronic kidney disease: Secondary | ICD-10-CM | POA: Diagnosis not present

## 2017-06-30 DIAGNOSIS — E1122 Type 2 diabetes mellitus with diabetic chronic kidney disease: Secondary | ICD-10-CM | POA: Diagnosis not present

## 2017-06-30 DIAGNOSIS — N186 End stage renal disease: Secondary | ICD-10-CM | POA: Diagnosis not present

## 2017-06-30 DIAGNOSIS — L97512 Non-pressure chronic ulcer of other part of right foot with fat layer exposed: Secondary | ICD-10-CM | POA: Diagnosis not present

## 2017-06-30 DIAGNOSIS — L97522 Non-pressure chronic ulcer of other part of left foot with fat layer exposed: Secondary | ICD-10-CM | POA: Diagnosis not present

## 2017-06-30 DIAGNOSIS — I519 Heart disease, unspecified: Secondary | ICD-10-CM | POA: Diagnosis not present

## 2017-06-30 DIAGNOSIS — E11621 Type 2 diabetes mellitus with foot ulcer: Secondary | ICD-10-CM | POA: Diagnosis not present

## 2017-07-01 DIAGNOSIS — D631 Anemia in chronic kidney disease: Secondary | ICD-10-CM | POA: Diagnosis not present

## 2017-07-01 DIAGNOSIS — N186 End stage renal disease: Secondary | ICD-10-CM | POA: Diagnosis not present

## 2017-07-01 DIAGNOSIS — E1121 Type 2 diabetes mellitus with diabetic nephropathy: Secondary | ICD-10-CM | POA: Diagnosis not present

## 2017-07-01 DIAGNOSIS — N2581 Secondary hyperparathyroidism of renal origin: Secondary | ICD-10-CM | POA: Diagnosis not present

## 2017-07-01 DIAGNOSIS — M868X7 Other osteomyelitis, ankle and foot: Secondary | ICD-10-CM | POA: Diagnosis not present

## 2017-07-02 DIAGNOSIS — L97522 Non-pressure chronic ulcer of other part of left foot with fat layer exposed: Secondary | ICD-10-CM | POA: Diagnosis not present

## 2017-07-02 DIAGNOSIS — I519 Heart disease, unspecified: Secondary | ICD-10-CM | POA: Diagnosis not present

## 2017-07-02 DIAGNOSIS — E1122 Type 2 diabetes mellitus with diabetic chronic kidney disease: Secondary | ICD-10-CM | POA: Diagnosis not present

## 2017-07-02 DIAGNOSIS — L97512 Non-pressure chronic ulcer of other part of right foot with fat layer exposed: Secondary | ICD-10-CM | POA: Diagnosis not present

## 2017-07-02 DIAGNOSIS — E11621 Type 2 diabetes mellitus with foot ulcer: Secondary | ICD-10-CM | POA: Diagnosis not present

## 2017-07-02 DIAGNOSIS — N186 End stage renal disease: Secondary | ICD-10-CM | POA: Diagnosis not present

## 2017-07-04 DIAGNOSIS — N186 End stage renal disease: Secondary | ICD-10-CM | POA: Diagnosis not present

## 2017-07-04 DIAGNOSIS — N2581 Secondary hyperparathyroidism of renal origin: Secondary | ICD-10-CM | POA: Diagnosis not present

## 2017-07-04 DIAGNOSIS — M868X7 Other osteomyelitis, ankle and foot: Secondary | ICD-10-CM | POA: Diagnosis not present

## 2017-07-04 DIAGNOSIS — D631 Anemia in chronic kidney disease: Secondary | ICD-10-CM | POA: Diagnosis not present

## 2017-07-04 DIAGNOSIS — E1121 Type 2 diabetes mellitus with diabetic nephropathy: Secondary | ICD-10-CM | POA: Diagnosis not present

## 2017-07-05 DIAGNOSIS — M86372 Chronic multifocal osteomyelitis, left ankle and foot: Secondary | ICD-10-CM | POA: Diagnosis not present

## 2017-07-05 DIAGNOSIS — L97522 Non-pressure chronic ulcer of other part of left foot with fat layer exposed: Secondary | ICD-10-CM | POA: Diagnosis not present

## 2017-07-05 DIAGNOSIS — L97526 Non-pressure chronic ulcer of other part of left foot with bone involvement without evidence of necrosis: Secondary | ICD-10-CM | POA: Diagnosis not present

## 2017-07-05 DIAGNOSIS — M8618 Other acute osteomyelitis, other site: Secondary | ICD-10-CM | POA: Diagnosis not present

## 2017-07-05 DIAGNOSIS — L97519 Non-pressure chronic ulcer of other part of right foot with unspecified severity: Secondary | ICD-10-CM | POA: Diagnosis not present

## 2017-07-05 DIAGNOSIS — E11621 Type 2 diabetes mellitus with foot ulcer: Secondary | ICD-10-CM | POA: Diagnosis not present

## 2017-07-05 DIAGNOSIS — E1122 Type 2 diabetes mellitus with diabetic chronic kidney disease: Secondary | ICD-10-CM | POA: Diagnosis not present

## 2017-07-05 DIAGNOSIS — L97512 Non-pressure chronic ulcer of other part of right foot with fat layer exposed: Secondary | ICD-10-CM | POA: Diagnosis not present

## 2017-07-05 DIAGNOSIS — I132 Hypertensive heart and chronic kidney disease with heart failure and with stage 5 chronic kidney disease, or end stage renal disease: Secondary | ICD-10-CM | POA: Diagnosis not present

## 2017-07-05 LAB — AEROBIC/ANAEROBIC CULTURE (SURGICAL/DEEP WOUND)

## 2017-07-05 LAB — AEROBIC/ANAEROBIC CULTURE W GRAM STAIN (SURGICAL/DEEP WOUND)

## 2017-07-06 ENCOUNTER — Telehealth: Payer: Self-pay | Admitting: *Deleted

## 2017-07-06 DIAGNOSIS — N2581 Secondary hyperparathyroidism of renal origin: Secondary | ICD-10-CM | POA: Diagnosis not present

## 2017-07-06 DIAGNOSIS — M868X7 Other osteomyelitis, ankle and foot: Secondary | ICD-10-CM | POA: Diagnosis not present

## 2017-07-06 DIAGNOSIS — D631 Anemia in chronic kidney disease: Secondary | ICD-10-CM | POA: Diagnosis not present

## 2017-07-06 DIAGNOSIS — N186 End stage renal disease: Secondary | ICD-10-CM | POA: Diagnosis not present

## 2017-07-06 DIAGNOSIS — E1121 Type 2 diabetes mellitus with diabetic nephropathy: Secondary | ICD-10-CM | POA: Diagnosis not present

## 2017-07-06 NOTE — Telephone Encounter (Signed)
Dr Dellia Nims calling for Dr [name] regarding patient's Left Foot, would like call back next week at earliest convenience.  [phone] Landis Gandy, RN

## 2017-07-07 ENCOUNTER — Other Ambulatory Visit: Payer: Self-pay | Admitting: *Deleted

## 2017-07-07 DIAGNOSIS — N186 End stage renal disease: Secondary | ICD-10-CM | POA: Diagnosis not present

## 2017-07-07 DIAGNOSIS — E1122 Type 2 diabetes mellitus with diabetic chronic kidney disease: Secondary | ICD-10-CM | POA: Diagnosis not present

## 2017-07-07 DIAGNOSIS — L97512 Non-pressure chronic ulcer of other part of right foot with fat layer exposed: Secondary | ICD-10-CM | POA: Diagnosis not present

## 2017-07-07 DIAGNOSIS — L97522 Non-pressure chronic ulcer of other part of left foot with fat layer exposed: Secondary | ICD-10-CM | POA: Diagnosis not present

## 2017-07-07 DIAGNOSIS — E11621 Type 2 diabetes mellitus with foot ulcer: Secondary | ICD-10-CM | POA: Diagnosis not present

## 2017-07-07 DIAGNOSIS — I519 Heart disease, unspecified: Secondary | ICD-10-CM | POA: Diagnosis not present

## 2017-07-07 NOTE — Telephone Encounter (Signed)
Spoke with patient.  He has dialysis at [facility] [phone] Monday/Wednesday/Friday afternoons.  RN confirmed [facility] has vancomycin and fortaz on hand, will need to call back tomorrow to speak with Gerald Stabs to relay dosing information. Please advise dose amounts.   He previously took metronidazole 500mg  DAILY, wants to make sure he is now supposed to take it TID.  Still has medication left at home after he was told to stop it.  His pharmacy preference is updated. Please enter orders.

## 2017-07-07 NOTE — Telephone Encounter (Signed)
I spoke with Dr Dellia Nims, who is covering for Dr Ardeen Garland patient. He mentioned that the wound on his foot is much worse, more exposed bone with tissue looking worse than the photos listed on hatcher's note on 6/21. Dr Con Memos has sent bone culture - that appeared polymicrobial.   Sharyn Lull, could you please re-institute the following abtx to give with HD 1) vancomycin 2) ceftaz x 4 wk plus oral metronidazole 500mg  TID. In the meantime. You will have to call patient to find out which HD center he goes to. I vaguely remember [facility] but I could be wrong.  Dr Dellia Nims is going to relay to [place] to talk to hatcher so that patient can have a game plan regarding further management of osteomyelitis. The patient previously saw Dr [name]. May need to discuss amputation.

## 2017-07-08 DIAGNOSIS — N2581 Secondary hyperparathyroidism of renal origin: Secondary | ICD-10-CM | POA: Diagnosis not present

## 2017-07-08 DIAGNOSIS — N186 End stage renal disease: Secondary | ICD-10-CM | POA: Diagnosis not present

## 2017-07-08 DIAGNOSIS — E1121 Type 2 diabetes mellitus with diabetic nephropathy: Secondary | ICD-10-CM | POA: Diagnosis not present

## 2017-07-08 DIAGNOSIS — D631 Anemia in chronic kidney disease: Secondary | ICD-10-CM | POA: Diagnosis not present

## 2017-07-08 DIAGNOSIS — M868X7 Other osteomyelitis, ankle and foot: Secondary | ICD-10-CM | POA: Diagnosis not present

## 2017-07-09 DIAGNOSIS — L97522 Non-pressure chronic ulcer of other part of left foot with fat layer exposed: Secondary | ICD-10-CM | POA: Diagnosis not present

## 2017-07-09 DIAGNOSIS — L97512 Non-pressure chronic ulcer of other part of right foot with fat layer exposed: Secondary | ICD-10-CM | POA: Diagnosis not present

## 2017-07-09 DIAGNOSIS — I519 Heart disease, unspecified: Secondary | ICD-10-CM | POA: Diagnosis not present

## 2017-07-09 DIAGNOSIS — E1122 Type 2 diabetes mellitus with diabetic chronic kidney disease: Secondary | ICD-10-CM | POA: Diagnosis not present

## 2017-07-09 DIAGNOSIS — E11621 Type 2 diabetes mellitus with foot ulcer: Secondary | ICD-10-CM | POA: Diagnosis not present

## 2017-07-09 DIAGNOSIS — N186 End stage renal disease: Secondary | ICD-10-CM | POA: Diagnosis not present

## 2017-07-11 DIAGNOSIS — D631 Anemia in chronic kidney disease: Secondary | ICD-10-CM | POA: Diagnosis not present

## 2017-07-11 DIAGNOSIS — M868X7 Other osteomyelitis, ankle and foot: Secondary | ICD-10-CM | POA: Diagnosis not present

## 2017-07-11 DIAGNOSIS — N2581 Secondary hyperparathyroidism of renal origin: Secondary | ICD-10-CM | POA: Diagnosis not present

## 2017-07-11 DIAGNOSIS — E1121 Type 2 diabetes mellitus with diabetic nephropathy: Secondary | ICD-10-CM | POA: Diagnosis not present

## 2017-07-11 DIAGNOSIS — N186 End stage renal disease: Secondary | ICD-10-CM | POA: Diagnosis not present

## 2017-07-11 NOTE — Telephone Encounter (Signed)
Please advise on orders. Thank you!

## 2017-07-11 NOTE — Telephone Encounter (Signed)
Ok. Should patient take oral metronidazole? He stated he had some left over at home he could restart.

## 2017-07-11 NOTE — Telephone Encounter (Signed)
Spoke with Dr Dellia Nims, I expect that he will need further surgery even possible amputation.  thanks

## 2017-07-12 DIAGNOSIS — L97522 Non-pressure chronic ulcer of other part of left foot with fat layer exposed: Secondary | ICD-10-CM | POA: Diagnosis not present

## 2017-07-12 DIAGNOSIS — L97512 Non-pressure chronic ulcer of other part of right foot with fat layer exposed: Secondary | ICD-10-CM | POA: Diagnosis not present

## 2017-07-12 DIAGNOSIS — I132 Hypertensive heart and chronic kidney disease with heart failure and with stage 5 chronic kidney disease, or end stage renal disease: Secondary | ICD-10-CM | POA: Diagnosis not present

## 2017-07-12 DIAGNOSIS — E1122 Type 2 diabetes mellitus with diabetic chronic kidney disease: Secondary | ICD-10-CM | POA: Diagnosis not present

## 2017-07-12 DIAGNOSIS — E11621 Type 2 diabetes mellitus with foot ulcer: Secondary | ICD-10-CM | POA: Diagnosis not present

## 2017-07-12 DIAGNOSIS — M86372 Chronic multifocal osteomyelitis, left ankle and foot: Secondary | ICD-10-CM | POA: Diagnosis not present

## 2017-07-12 DIAGNOSIS — L97524 Non-pressure chronic ulcer of other part of left foot with necrosis of bone: Secondary | ICD-10-CM | POA: Diagnosis not present

## 2017-07-13 DIAGNOSIS — E1121 Type 2 diabetes mellitus with diabetic nephropathy: Secondary | ICD-10-CM | POA: Diagnosis not present

## 2017-07-13 DIAGNOSIS — M868X7 Other osteomyelitis, ankle and foot: Secondary | ICD-10-CM | POA: Diagnosis not present

## 2017-07-13 DIAGNOSIS — N2581 Secondary hyperparathyroidism of renal origin: Secondary | ICD-10-CM | POA: Diagnosis not present

## 2017-07-13 DIAGNOSIS — D631 Anemia in chronic kidney disease: Secondary | ICD-10-CM | POA: Diagnosis not present

## 2017-07-13 DIAGNOSIS — N186 End stage renal disease: Secondary | ICD-10-CM | POA: Diagnosis not present

## 2017-07-13 NOTE — Telephone Encounter (Signed)
It is ok for him to resume his flagyl.  If he needs to have this refilled, this is ok as well thanks

## 2017-07-14 ENCOUNTER — Other Ambulatory Visit: Payer: Self-pay | Admitting: *Deleted

## 2017-07-14 DIAGNOSIS — E11621 Type 2 diabetes mellitus with foot ulcer: Secondary | ICD-10-CM

## 2017-07-14 DIAGNOSIS — L97512 Non-pressure chronic ulcer of other part of right foot with fat layer exposed: Secondary | ICD-10-CM | POA: Diagnosis not present

## 2017-07-14 DIAGNOSIS — I519 Heart disease, unspecified: Secondary | ICD-10-CM | POA: Diagnosis not present

## 2017-07-14 DIAGNOSIS — L97529 Non-pressure chronic ulcer of other part of left foot with unspecified severity: Principal | ICD-10-CM

## 2017-07-14 DIAGNOSIS — N186 End stage renal disease: Secondary | ICD-10-CM | POA: Diagnosis not present

## 2017-07-14 DIAGNOSIS — Z9889 Other specified postprocedural states: Secondary | ICD-10-CM | POA: Diagnosis not present

## 2017-07-14 DIAGNOSIS — L97522 Non-pressure chronic ulcer of other part of left foot with fat layer exposed: Secondary | ICD-10-CM | POA: Diagnosis not present

## 2017-07-14 DIAGNOSIS — E1122 Type 2 diabetes mellitus with diabetic chronic kidney disease: Secondary | ICD-10-CM | POA: Diagnosis not present

## 2017-07-14 MED ORDER — METRONIDAZOLE 500 MG PO TABS: 500.0000 mg | ORAL_TABLET | Freq: Three times a day (TID) | ORAL | 0 refills | Status: DC

## 2017-07-14 NOTE — Telephone Encounter (Signed)
Refilled previous prescription - 500 mg TID #42 no refills.

## 2017-07-14 NOTE — Telephone Encounter (Signed)
Tid  Thank you

## 2017-07-14 NOTE — Telephone Encounter (Signed)
Please advise if flagyl is once daily or TID.  Patient called today, stated his antibiotics (vancomycin/fortaz) started at dialysis Monday.

## 2017-07-15 DIAGNOSIS — N186 End stage renal disease: Secondary | ICD-10-CM | POA: Diagnosis not present

## 2017-07-15 DIAGNOSIS — E1121 Type 2 diabetes mellitus with diabetic nephropathy: Secondary | ICD-10-CM | POA: Diagnosis not present

## 2017-07-15 DIAGNOSIS — M868X7 Other osteomyelitis, ankle and foot: Secondary | ICD-10-CM | POA: Diagnosis not present

## 2017-07-15 DIAGNOSIS — N2581 Secondary hyperparathyroidism of renal origin: Secondary | ICD-10-CM | POA: Diagnosis not present

## 2017-07-15 DIAGNOSIS — D631 Anemia in chronic kidney disease: Secondary | ICD-10-CM | POA: Diagnosis not present

## 2017-07-16 DIAGNOSIS — E11621 Type 2 diabetes mellitus with foot ulcer: Secondary | ICD-10-CM | POA: Diagnosis not present

## 2017-07-16 DIAGNOSIS — I519 Heart disease, unspecified: Secondary | ICD-10-CM | POA: Diagnosis not present

## 2017-07-16 DIAGNOSIS — N186 End stage renal disease: Secondary | ICD-10-CM | POA: Diagnosis not present

## 2017-07-16 DIAGNOSIS — E1122 Type 2 diabetes mellitus with diabetic chronic kidney disease: Secondary | ICD-10-CM | POA: Diagnosis not present

## 2017-07-16 DIAGNOSIS — L97512 Non-pressure chronic ulcer of other part of right foot with fat layer exposed: Secondary | ICD-10-CM | POA: Diagnosis not present

## 2017-07-16 DIAGNOSIS — L97522 Non-pressure chronic ulcer of other part of left foot with fat layer exposed: Secondary | ICD-10-CM | POA: Diagnosis not present

## 2017-07-18 DIAGNOSIS — E1121 Type 2 diabetes mellitus with diabetic nephropathy: Secondary | ICD-10-CM | POA: Diagnosis not present

## 2017-07-18 DIAGNOSIS — M868X7 Other osteomyelitis, ankle and foot: Secondary | ICD-10-CM | POA: Diagnosis not present

## 2017-07-18 DIAGNOSIS — N2581 Secondary hyperparathyroidism of renal origin: Secondary | ICD-10-CM | POA: Diagnosis not present

## 2017-07-18 DIAGNOSIS — D631 Anemia in chronic kidney disease: Secondary | ICD-10-CM | POA: Diagnosis not present

## 2017-07-18 DIAGNOSIS — N186 End stage renal disease: Secondary | ICD-10-CM | POA: Diagnosis not present

## 2017-07-19 DIAGNOSIS — L97522 Non-pressure chronic ulcer of other part of left foot with fat layer exposed: Secondary | ICD-10-CM | POA: Diagnosis not present

## 2017-07-19 DIAGNOSIS — E11621 Type 2 diabetes mellitus with foot ulcer: Secondary | ICD-10-CM | POA: Diagnosis not present

## 2017-07-19 DIAGNOSIS — L97524 Non-pressure chronic ulcer of other part of left foot with necrosis of bone: Secondary | ICD-10-CM | POA: Diagnosis not present

## 2017-07-19 DIAGNOSIS — I132 Hypertensive heart and chronic kidney disease with heart failure and with stage 5 chronic kidney disease, or end stage renal disease: Secondary | ICD-10-CM | POA: Diagnosis not present

## 2017-07-19 DIAGNOSIS — L97512 Non-pressure chronic ulcer of other part of right foot with fat layer exposed: Secondary | ICD-10-CM | POA: Diagnosis not present

## 2017-07-19 DIAGNOSIS — E1122 Type 2 diabetes mellitus with diabetic chronic kidney disease: Secondary | ICD-10-CM | POA: Diagnosis not present

## 2017-07-19 DIAGNOSIS — M86372 Chronic multifocal osteomyelitis, left ankle and foot: Secondary | ICD-10-CM | POA: Diagnosis not present

## 2017-07-20 DIAGNOSIS — D631 Anemia in chronic kidney disease: Secondary | ICD-10-CM | POA: Diagnosis not present

## 2017-07-20 DIAGNOSIS — N2581 Secondary hyperparathyroidism of renal origin: Secondary | ICD-10-CM | POA: Diagnosis not present

## 2017-07-20 DIAGNOSIS — N186 End stage renal disease: Secondary | ICD-10-CM | POA: Diagnosis not present

## 2017-07-20 DIAGNOSIS — M868X7 Other osteomyelitis, ankle and foot: Secondary | ICD-10-CM | POA: Diagnosis not present

## 2017-07-20 DIAGNOSIS — E1121 Type 2 diabetes mellitus with diabetic nephropathy: Secondary | ICD-10-CM | POA: Diagnosis not present

## 2017-07-21 DIAGNOSIS — N186 End stage renal disease: Secondary | ICD-10-CM | POA: Diagnosis not present

## 2017-07-21 DIAGNOSIS — I519 Heart disease, unspecified: Secondary | ICD-10-CM | POA: Diagnosis not present

## 2017-07-21 DIAGNOSIS — L97522 Non-pressure chronic ulcer of other part of left foot with fat layer exposed: Secondary | ICD-10-CM | POA: Diagnosis not present

## 2017-07-21 DIAGNOSIS — L97512 Non-pressure chronic ulcer of other part of right foot with fat layer exposed: Secondary | ICD-10-CM | POA: Diagnosis not present

## 2017-07-21 DIAGNOSIS — E11621 Type 2 diabetes mellitus with foot ulcer: Secondary | ICD-10-CM | POA: Diagnosis not present

## 2017-07-21 DIAGNOSIS — E1122 Type 2 diabetes mellitus with diabetic chronic kidney disease: Secondary | ICD-10-CM | POA: Diagnosis not present

## 2017-07-22 DIAGNOSIS — N186 End stage renal disease: Secondary | ICD-10-CM | POA: Diagnosis not present

## 2017-07-22 DIAGNOSIS — E1121 Type 2 diabetes mellitus with diabetic nephropathy: Secondary | ICD-10-CM | POA: Diagnosis not present

## 2017-07-22 DIAGNOSIS — N2581 Secondary hyperparathyroidism of renal origin: Secondary | ICD-10-CM | POA: Diagnosis not present

## 2017-07-22 DIAGNOSIS — D631 Anemia in chronic kidney disease: Secondary | ICD-10-CM | POA: Diagnosis not present

## 2017-07-22 DIAGNOSIS — M868X7 Other osteomyelitis, ankle and foot: Secondary | ICD-10-CM | POA: Diagnosis not present

## 2017-07-23 DIAGNOSIS — E1122 Type 2 diabetes mellitus with diabetic chronic kidney disease: Secondary | ICD-10-CM | POA: Diagnosis not present

## 2017-07-23 DIAGNOSIS — E11621 Type 2 diabetes mellitus with foot ulcer: Secondary | ICD-10-CM | POA: Diagnosis not present

## 2017-07-23 DIAGNOSIS — N186 End stage renal disease: Secondary | ICD-10-CM | POA: Diagnosis not present

## 2017-07-23 DIAGNOSIS — L97522 Non-pressure chronic ulcer of other part of left foot with fat layer exposed: Secondary | ICD-10-CM | POA: Diagnosis not present

## 2017-07-23 DIAGNOSIS — L97512 Non-pressure chronic ulcer of other part of right foot with fat layer exposed: Secondary | ICD-10-CM | POA: Diagnosis not present

## 2017-07-23 DIAGNOSIS — I519 Heart disease, unspecified: Secondary | ICD-10-CM | POA: Diagnosis not present

## 2017-07-25 DIAGNOSIS — E1122 Type 2 diabetes mellitus with diabetic chronic kidney disease: Secondary | ICD-10-CM | POA: Diagnosis not present

## 2017-07-25 DIAGNOSIS — L97522 Non-pressure chronic ulcer of other part of left foot with fat layer exposed: Secondary | ICD-10-CM | POA: Diagnosis not present

## 2017-07-25 DIAGNOSIS — M86372 Chronic multifocal osteomyelitis, left ankle and foot: Secondary | ICD-10-CM | POA: Diagnosis not present

## 2017-07-25 DIAGNOSIS — N186 End stage renal disease: Secondary | ICD-10-CM | POA: Diagnosis not present

## 2017-07-25 DIAGNOSIS — E1121 Type 2 diabetes mellitus with diabetic nephropathy: Secondary | ICD-10-CM | POA: Diagnosis not present

## 2017-07-25 DIAGNOSIS — E11621 Type 2 diabetes mellitus with foot ulcer: Secondary | ICD-10-CM | POA: Diagnosis not present

## 2017-07-25 DIAGNOSIS — L97512 Non-pressure chronic ulcer of other part of right foot with fat layer exposed: Secondary | ICD-10-CM | POA: Diagnosis not present

## 2017-07-25 DIAGNOSIS — N2581 Secondary hyperparathyroidism of renal origin: Secondary | ICD-10-CM | POA: Diagnosis not present

## 2017-07-25 DIAGNOSIS — M868X7 Other osteomyelitis, ankle and foot: Secondary | ICD-10-CM | POA: Diagnosis not present

## 2017-07-25 DIAGNOSIS — I132 Hypertensive heart and chronic kidney disease with heart failure and with stage 5 chronic kidney disease, or end stage renal disease: Secondary | ICD-10-CM | POA: Diagnosis not present

## 2017-07-25 DIAGNOSIS — D631 Anemia in chronic kidney disease: Secondary | ICD-10-CM | POA: Diagnosis not present

## 2017-07-25 LAB — GLUCOSE, CAPILLARY
Glucose-Capillary: 159 mg/dL — ABNORMAL HIGH (ref 65–99)
Glucose-Capillary: 151 mg/dL — ABNORMAL HIGH (ref 65–99)

## 2017-07-26 DIAGNOSIS — L97524 Non-pressure chronic ulcer of other part of left foot with necrosis of bone: Secondary | ICD-10-CM | POA: Diagnosis not present

## 2017-07-26 DIAGNOSIS — N186 End stage renal disease: Secondary | ICD-10-CM | POA: Diagnosis not present

## 2017-07-26 DIAGNOSIS — L97522 Non-pressure chronic ulcer of other part of left foot with fat layer exposed: Secondary | ICD-10-CM | POA: Diagnosis not present

## 2017-07-26 DIAGNOSIS — E11621 Type 2 diabetes mellitus with foot ulcer: Secondary | ICD-10-CM | POA: Diagnosis not present

## 2017-07-26 DIAGNOSIS — I132 Hypertensive heart and chronic kidney disease with heart failure and with stage 5 chronic kidney disease, or end stage renal disease: Secondary | ICD-10-CM | POA: Diagnosis not present

## 2017-07-26 DIAGNOSIS — L97512 Non-pressure chronic ulcer of other part of right foot with fat layer exposed: Secondary | ICD-10-CM | POA: Diagnosis not present

## 2017-07-26 DIAGNOSIS — E1122 Type 2 diabetes mellitus with diabetic chronic kidney disease: Secondary | ICD-10-CM | POA: Diagnosis not present

## 2017-07-26 DIAGNOSIS — Z992 Dependence on renal dialysis: Secondary | ICD-10-CM | POA: Diagnosis not present

## 2017-07-26 DIAGNOSIS — M86372 Chronic multifocal osteomyelitis, left ankle and foot: Secondary | ICD-10-CM | POA: Diagnosis not present

## 2017-07-26 LAB — GLUCOSE, CAPILLARY
Glucose-Capillary: 193 mg/dL — ABNORMAL HIGH (ref 65–99)
Glucose-Capillary: 173 mg/dL — ABNORMAL HIGH (ref 65–99)

## 2017-07-27 ENCOUNTER — Encounter (HOSPITAL_BASED_OUTPATIENT_CLINIC_OR_DEPARTMENT_OTHER): Payer: Medicare Other | Attending: Surgery

## 2017-07-27 DIAGNOSIS — D631 Anemia in chronic kidney disease: Secondary | ICD-10-CM | POA: Diagnosis not present

## 2017-07-27 DIAGNOSIS — I132 Hypertensive heart and chronic kidney disease with heart failure and with stage 5 chronic kidney disease, or end stage renal disease: Secondary | ICD-10-CM | POA: Diagnosis not present

## 2017-07-27 DIAGNOSIS — L97522 Non-pressure chronic ulcer of other part of left foot with fat layer exposed: Secondary | ICD-10-CM | POA: Diagnosis not present

## 2017-07-27 DIAGNOSIS — E1121 Type 2 diabetes mellitus with diabetic nephropathy: Secondary | ICD-10-CM | POA: Diagnosis not present

## 2017-07-27 DIAGNOSIS — E1151 Type 2 diabetes mellitus with diabetic peripheral angiopathy without gangrene: Secondary | ICD-10-CM | POA: Insufficient documentation

## 2017-07-27 DIAGNOSIS — M86372 Chronic multifocal osteomyelitis, left ankle and foot: Secondary | ICD-10-CM | POA: Diagnosis not present

## 2017-07-27 DIAGNOSIS — E1122 Type 2 diabetes mellitus with diabetic chronic kidney disease: Secondary | ICD-10-CM | POA: Insufficient documentation

## 2017-07-27 DIAGNOSIS — N2581 Secondary hyperparathyroidism of renal origin: Secondary | ICD-10-CM | POA: Diagnosis not present

## 2017-07-27 DIAGNOSIS — N186 End stage renal disease: Secondary | ICD-10-CM | POA: Insufficient documentation

## 2017-07-27 DIAGNOSIS — E1140 Type 2 diabetes mellitus with diabetic neuropathy, unspecified: Secondary | ICD-10-CM | POA: Diagnosis not present

## 2017-07-27 DIAGNOSIS — Z992 Dependence on renal dialysis: Secondary | ICD-10-CM | POA: Insufficient documentation

## 2017-07-27 DIAGNOSIS — I509 Heart failure, unspecified: Secondary | ICD-10-CM | POA: Insufficient documentation

## 2017-07-27 DIAGNOSIS — E11621 Type 2 diabetes mellitus with foot ulcer: Secondary | ICD-10-CM | POA: Diagnosis not present

## 2017-07-27 DIAGNOSIS — M868X7 Other osteomyelitis, ankle and foot: Secondary | ICD-10-CM | POA: Diagnosis not present

## 2017-07-27 LAB — GLUCOSE, CAPILLARY
GLUCOSE-CAPILLARY: 162 mg/dL — AB (ref 65–99)
Glucose-Capillary: 156 mg/dL — ABNORMAL HIGH (ref 65–99)

## 2017-07-28 DIAGNOSIS — I519 Heart disease, unspecified: Secondary | ICD-10-CM | POA: Diagnosis not present

## 2017-07-28 DIAGNOSIS — N186 End stage renal disease: Secondary | ICD-10-CM | POA: Diagnosis not present

## 2017-07-28 DIAGNOSIS — L97512 Non-pressure chronic ulcer of other part of right foot with fat layer exposed: Secondary | ICD-10-CM | POA: Diagnosis not present

## 2017-07-28 DIAGNOSIS — E11621 Type 2 diabetes mellitus with foot ulcer: Secondary | ICD-10-CM | POA: Diagnosis not present

## 2017-07-28 DIAGNOSIS — L97522 Non-pressure chronic ulcer of other part of left foot with fat layer exposed: Secondary | ICD-10-CM | POA: Diagnosis not present

## 2017-07-28 DIAGNOSIS — I132 Hypertensive heart and chronic kidney disease with heart failure and with stage 5 chronic kidney disease, or end stage renal disease: Secondary | ICD-10-CM | POA: Diagnosis not present

## 2017-07-28 DIAGNOSIS — M86372 Chronic multifocal osteomyelitis, left ankle and foot: Secondary | ICD-10-CM | POA: Diagnosis not present

## 2017-07-28 DIAGNOSIS — E1122 Type 2 diabetes mellitus with diabetic chronic kidney disease: Secondary | ICD-10-CM | POA: Diagnosis not present

## 2017-07-28 LAB — GLUCOSE, CAPILLARY
Glucose-Capillary: 156 mg/dL — ABNORMAL HIGH (ref 65–99)
Glucose-Capillary: 148 mg/dL — ABNORMAL HIGH (ref 65–99)

## 2017-07-29 DIAGNOSIS — N2581 Secondary hyperparathyroidism of renal origin: Secondary | ICD-10-CM | POA: Diagnosis not present

## 2017-07-29 DIAGNOSIS — L97522 Non-pressure chronic ulcer of other part of left foot with fat layer exposed: Secondary | ICD-10-CM | POA: Diagnosis not present

## 2017-07-29 DIAGNOSIS — N186 End stage renal disease: Secondary | ICD-10-CM | POA: Diagnosis not present

## 2017-07-29 DIAGNOSIS — M86372 Chronic multifocal osteomyelitis, left ankle and foot: Secondary | ICD-10-CM | POA: Diagnosis not present

## 2017-07-29 DIAGNOSIS — M868X7 Other osteomyelitis, ankle and foot: Secondary | ICD-10-CM | POA: Diagnosis not present

## 2017-07-29 DIAGNOSIS — D631 Anemia in chronic kidney disease: Secondary | ICD-10-CM | POA: Diagnosis not present

## 2017-07-29 DIAGNOSIS — E11621 Type 2 diabetes mellitus with foot ulcer: Secondary | ICD-10-CM | POA: Diagnosis not present

## 2017-07-29 DIAGNOSIS — E1121 Type 2 diabetes mellitus with diabetic nephropathy: Secondary | ICD-10-CM | POA: Diagnosis not present

## 2017-07-29 DIAGNOSIS — I132 Hypertensive heart and chronic kidney disease with heart failure and with stage 5 chronic kidney disease, or end stage renal disease: Secondary | ICD-10-CM | POA: Diagnosis not present

## 2017-07-29 DIAGNOSIS — E1122 Type 2 diabetes mellitus with diabetic chronic kidney disease: Secondary | ICD-10-CM | POA: Diagnosis not present

## 2017-07-29 DIAGNOSIS — L97524 Non-pressure chronic ulcer of other part of left foot with necrosis of bone: Secondary | ICD-10-CM | POA: Diagnosis not present

## 2017-07-29 LAB — GLUCOSE, CAPILLARY
Glucose-Capillary: 173 mg/dL — ABNORMAL HIGH (ref 65–99)
Glucose-Capillary: 150 mg/dL — ABNORMAL HIGH (ref 65–99)

## 2017-07-30 DIAGNOSIS — N186 End stage renal disease: Secondary | ICD-10-CM | POA: Diagnosis not present

## 2017-07-30 DIAGNOSIS — E1122 Type 2 diabetes mellitus with diabetic chronic kidney disease: Secondary | ICD-10-CM | POA: Diagnosis not present

## 2017-07-30 DIAGNOSIS — L97522 Non-pressure chronic ulcer of other part of left foot with fat layer exposed: Secondary | ICD-10-CM | POA: Diagnosis not present

## 2017-07-30 DIAGNOSIS — E11621 Type 2 diabetes mellitus with foot ulcer: Secondary | ICD-10-CM | POA: Diagnosis not present

## 2017-07-30 DIAGNOSIS — L97512 Non-pressure chronic ulcer of other part of right foot with fat layer exposed: Secondary | ICD-10-CM | POA: Diagnosis not present

## 2017-07-30 DIAGNOSIS — I519 Heart disease, unspecified: Secondary | ICD-10-CM | POA: Diagnosis not present

## 2017-08-01 DIAGNOSIS — I132 Hypertensive heart and chronic kidney disease with heart failure and with stage 5 chronic kidney disease, or end stage renal disease: Secondary | ICD-10-CM | POA: Diagnosis not present

## 2017-08-01 DIAGNOSIS — E11621 Type 2 diabetes mellitus with foot ulcer: Secondary | ICD-10-CM | POA: Diagnosis not present

## 2017-08-01 DIAGNOSIS — D631 Anemia in chronic kidney disease: Secondary | ICD-10-CM | POA: Diagnosis not present

## 2017-08-01 DIAGNOSIS — M868X7 Other osteomyelitis, ankle and foot: Secondary | ICD-10-CM | POA: Diagnosis not present

## 2017-08-01 DIAGNOSIS — M86372 Chronic multifocal osteomyelitis, left ankle and foot: Secondary | ICD-10-CM | POA: Diagnosis not present

## 2017-08-01 DIAGNOSIS — N186 End stage renal disease: Secondary | ICD-10-CM | POA: Diagnosis not present

## 2017-08-01 DIAGNOSIS — E1121 Type 2 diabetes mellitus with diabetic nephropathy: Secondary | ICD-10-CM | POA: Diagnosis not present

## 2017-08-01 DIAGNOSIS — N2581 Secondary hyperparathyroidism of renal origin: Secondary | ICD-10-CM | POA: Diagnosis not present

## 2017-08-01 DIAGNOSIS — E1122 Type 2 diabetes mellitus with diabetic chronic kidney disease: Secondary | ICD-10-CM | POA: Diagnosis not present

## 2017-08-01 DIAGNOSIS — L97522 Non-pressure chronic ulcer of other part of left foot with fat layer exposed: Secondary | ICD-10-CM | POA: Diagnosis not present

## 2017-08-01 LAB — GLUCOSE, CAPILLARY
GLUCOSE-CAPILLARY: 183 mg/dL — AB (ref 65–99)
Glucose-Capillary: 237 mg/dL — ABNORMAL HIGH (ref 65–99)

## 2017-08-02 DIAGNOSIS — E11621 Type 2 diabetes mellitus with foot ulcer: Secondary | ICD-10-CM | POA: Diagnosis not present

## 2017-08-02 DIAGNOSIS — I132 Hypertensive heart and chronic kidney disease with heart failure and with stage 5 chronic kidney disease, or end stage renal disease: Secondary | ICD-10-CM | POA: Diagnosis not present

## 2017-08-02 DIAGNOSIS — L97522 Non-pressure chronic ulcer of other part of left foot with fat layer exposed: Secondary | ICD-10-CM | POA: Diagnosis not present

## 2017-08-02 DIAGNOSIS — L97524 Non-pressure chronic ulcer of other part of left foot with necrosis of bone: Secondary | ICD-10-CM | POA: Diagnosis not present

## 2017-08-02 DIAGNOSIS — M86372 Chronic multifocal osteomyelitis, left ankle and foot: Secondary | ICD-10-CM | POA: Diagnosis not present

## 2017-08-02 DIAGNOSIS — E1122 Type 2 diabetes mellitus with diabetic chronic kidney disease: Secondary | ICD-10-CM | POA: Diagnosis not present

## 2017-08-02 DIAGNOSIS — N186 End stage renal disease: Secondary | ICD-10-CM | POA: Diagnosis not present

## 2017-08-02 DIAGNOSIS — L97526 Non-pressure chronic ulcer of other part of left foot with bone involvement without evidence of necrosis: Secondary | ICD-10-CM | POA: Diagnosis not present

## 2017-08-02 LAB — GLUCOSE, CAPILLARY
Glucose-Capillary: 168 mg/dL — ABNORMAL HIGH (ref 65–99)
Glucose-Capillary: 158 mg/dL — ABNORMAL HIGH (ref 65–99)

## 2017-08-03 ENCOUNTER — Encounter: Payer: Self-pay | Admitting: Infectious Diseases

## 2017-08-03 ENCOUNTER — Ambulatory Visit (INDEPENDENT_AMBULATORY_CARE_PROVIDER_SITE_OTHER): Payer: Medicare Other | Admitting: Infectious Diseases

## 2017-08-03 DIAGNOSIS — E1121 Type 2 diabetes mellitus with diabetic nephropathy: Secondary | ICD-10-CM | POA: Diagnosis not present

## 2017-08-03 DIAGNOSIS — Z992 Dependence on renal dialysis: Secondary | ICD-10-CM

## 2017-08-03 DIAGNOSIS — N186 End stage renal disease: Secondary | ICD-10-CM

## 2017-08-03 DIAGNOSIS — M86572 Other chronic hematogenous osteomyelitis, left ankle and foot: Secondary | ICD-10-CM

## 2017-08-03 DIAGNOSIS — L97529 Non-pressure chronic ulcer of other part of left foot with unspecified severity: Secondary | ICD-10-CM

## 2017-08-03 DIAGNOSIS — N2581 Secondary hyperparathyroidism of renal origin: Secondary | ICD-10-CM | POA: Diagnosis not present

## 2017-08-03 DIAGNOSIS — D631 Anemia in chronic kidney disease: Secondary | ICD-10-CM | POA: Diagnosis not present

## 2017-08-03 DIAGNOSIS — E11621 Type 2 diabetes mellitus with foot ulcer: Secondary | ICD-10-CM | POA: Diagnosis not present

## 2017-08-03 DIAGNOSIS — M868X7 Other osteomyelitis, ankle and foot: Secondary | ICD-10-CM | POA: Diagnosis not present

## 2017-08-03 NOTE — Assessment & Plan Note (Signed)
His glc is fairly well controlled.  Appreciate care of his PCP.

## 2017-08-03 NOTE — Assessment & Plan Note (Signed)
Appreciate partnering with renal.

## 2017-08-03 NOTE — Assessment & Plan Note (Signed)
His wound is being well managed by [facility].  Will try to get records from HD on his anbx.  We will see him back in 6 weeks.

## 2017-08-03 NOTE — Progress Notes (Signed)
   Subjective:    Patient ID: [name] Sages Sr., male    DOB: 1951/01/25, 66 y.o.   MRN: [id number]  HPI 67 yo M with hx of DM2, ESRD, CAD, and ulcers on R first metatarsal head; L 5th and L 1st MT heads.  He was seen in ID on 5-21 with worsening of his R 5th ray and was sent to hospital. His wound Cx was polymicrobial. 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 His BCx were negative.  He was kept on vanco/ceftaz/flagyl for 2 weeks post-op.  He was seen in ID on 6-21 and was watched off anbx.  Continues to follow at [facility]. Has wound vac (not on today).  No f/c. No odor from wound. VAC is getting some d/c out.  Has been going to hyperbaric chamber also.  FSG have been "pretty good". Last A1C was 7.1%.   He was restarted on his anbx at HD. Is planned for 6 weeks (vanco/ceftaz/flagyl).  He is unclear of completion date.   Review of Systems  Constitutional: Negative for appetite change, chills, fever and unexpected weight change.  Gastrointestinal: Negative for constipation and diarrhea.  Genitourinary: Negative for difficulty urinating.  Skin: Positive for wound.  No problems with HD.      Objective:   Physical Exam  Constitutional: He appears well-developed and well-nourished.  Cardiovascular: Normal rate, regular rhythm and normal heart sounds.   Pulmonary/Chest: Effort normal and breath sounds normal.  Abdominal: Soft. Bowel sounds are normal. There is no tenderness. There is no rebound.  Skin:  Wound is clean, no d/c, no odor. Non-tender.   AVF is +bruit. Clean.             Assessment & Plan:

## 2017-08-04 DIAGNOSIS — M86372 Chronic multifocal osteomyelitis, left ankle and foot: Secondary | ICD-10-CM | POA: Diagnosis not present

## 2017-08-04 DIAGNOSIS — E11621 Type 2 diabetes mellitus with foot ulcer: Secondary | ICD-10-CM | POA: Diagnosis not present

## 2017-08-04 DIAGNOSIS — I519 Heart disease, unspecified: Secondary | ICD-10-CM | POA: Diagnosis not present

## 2017-08-04 DIAGNOSIS — E1122 Type 2 diabetes mellitus with diabetic chronic kidney disease: Secondary | ICD-10-CM | POA: Diagnosis not present

## 2017-08-04 DIAGNOSIS — L97512 Non-pressure chronic ulcer of other part of right foot with fat layer exposed: Secondary | ICD-10-CM | POA: Diagnosis not present

## 2017-08-04 DIAGNOSIS — I132 Hypertensive heart and chronic kidney disease with heart failure and with stage 5 chronic kidney disease, or end stage renal disease: Secondary | ICD-10-CM | POA: Diagnosis not present

## 2017-08-04 DIAGNOSIS — L97522 Non-pressure chronic ulcer of other part of left foot with fat layer exposed: Secondary | ICD-10-CM | POA: Diagnosis not present

## 2017-08-04 DIAGNOSIS — N186 End stage renal disease: Secondary | ICD-10-CM | POA: Diagnosis not present

## 2017-08-04 LAB — GLUCOSE, CAPILLARY
GLUCOSE-CAPILLARY: 160 mg/dL — AB (ref 65–99)
GLUCOSE-CAPILLARY: 146 mg/dL — AB (ref 65–99)

## 2017-08-05 DIAGNOSIS — E1122 Type 2 diabetes mellitus with diabetic chronic kidney disease: Secondary | ICD-10-CM | POA: Diagnosis not present

## 2017-08-05 DIAGNOSIS — D631 Anemia in chronic kidney disease: Secondary | ICD-10-CM | POA: Diagnosis not present

## 2017-08-05 DIAGNOSIS — M86372 Chronic multifocal osteomyelitis, left ankle and foot: Secondary | ICD-10-CM | POA: Diagnosis not present

## 2017-08-05 DIAGNOSIS — E1121 Type 2 diabetes mellitus with diabetic nephropathy: Secondary | ICD-10-CM | POA: Diagnosis not present

## 2017-08-05 DIAGNOSIS — L97522 Non-pressure chronic ulcer of other part of left foot with fat layer exposed: Secondary | ICD-10-CM | POA: Diagnosis not present

## 2017-08-05 DIAGNOSIS — E11621 Type 2 diabetes mellitus with foot ulcer: Secondary | ICD-10-CM | POA: Diagnosis not present

## 2017-08-05 DIAGNOSIS — L97524 Non-pressure chronic ulcer of other part of left foot with necrosis of bone: Secondary | ICD-10-CM | POA: Diagnosis not present

## 2017-08-05 DIAGNOSIS — I132 Hypertensive heart and chronic kidney disease with heart failure and with stage 5 chronic kidney disease, or end stage renal disease: Secondary | ICD-10-CM | POA: Diagnosis not present

## 2017-08-05 DIAGNOSIS — N186 End stage renal disease: Secondary | ICD-10-CM | POA: Diagnosis not present

## 2017-08-05 DIAGNOSIS — N2581 Secondary hyperparathyroidism of renal origin: Secondary | ICD-10-CM | POA: Diagnosis not present

## 2017-08-05 DIAGNOSIS — M868X7 Other osteomyelitis, ankle and foot: Secondary | ICD-10-CM | POA: Diagnosis not present

## 2017-08-05 LAB — GLUCOSE, CAPILLARY
GLUCOSE-CAPILLARY: 179 mg/dL — AB (ref 65–99)
Glucose-Capillary: 226 mg/dL — ABNORMAL HIGH (ref 65–99)

## 2017-08-06 DIAGNOSIS — L97512 Non-pressure chronic ulcer of other part of right foot with fat layer exposed: Secondary | ICD-10-CM | POA: Diagnosis not present

## 2017-08-06 DIAGNOSIS — I519 Heart disease, unspecified: Secondary | ICD-10-CM | POA: Diagnosis not present

## 2017-08-06 DIAGNOSIS — L97522 Non-pressure chronic ulcer of other part of left foot with fat layer exposed: Secondary | ICD-10-CM | POA: Diagnosis not present

## 2017-08-06 DIAGNOSIS — E1122 Type 2 diabetes mellitus with diabetic chronic kidney disease: Secondary | ICD-10-CM | POA: Diagnosis not present

## 2017-08-06 DIAGNOSIS — N186 End stage renal disease: Secondary | ICD-10-CM | POA: Diagnosis not present

## 2017-08-06 DIAGNOSIS — E11621 Type 2 diabetes mellitus with foot ulcer: Secondary | ICD-10-CM | POA: Diagnosis not present

## 2017-08-08 DIAGNOSIS — E1121 Type 2 diabetes mellitus with diabetic nephropathy: Secondary | ICD-10-CM | POA: Diagnosis not present

## 2017-08-08 DIAGNOSIS — E1122 Type 2 diabetes mellitus with diabetic chronic kidney disease: Secondary | ICD-10-CM | POA: Diagnosis not present

## 2017-08-08 DIAGNOSIS — D631 Anemia in chronic kidney disease: Secondary | ICD-10-CM | POA: Diagnosis not present

## 2017-08-08 DIAGNOSIS — M86372 Chronic multifocal osteomyelitis, left ankle and foot: Secondary | ICD-10-CM | POA: Diagnosis not present

## 2017-08-08 DIAGNOSIS — N186 End stage renal disease: Secondary | ICD-10-CM | POA: Diagnosis not present

## 2017-08-08 DIAGNOSIS — E11621 Type 2 diabetes mellitus with foot ulcer: Secondary | ICD-10-CM | POA: Diagnosis not present

## 2017-08-08 DIAGNOSIS — I132 Hypertensive heart and chronic kidney disease with heart failure and with stage 5 chronic kidney disease, or end stage renal disease: Secondary | ICD-10-CM | POA: Diagnosis not present

## 2017-08-08 DIAGNOSIS — L97524 Non-pressure chronic ulcer of other part of left foot with necrosis of bone: Secondary | ICD-10-CM | POA: Diagnosis not present

## 2017-08-08 DIAGNOSIS — N2581 Secondary hyperparathyroidism of renal origin: Secondary | ICD-10-CM | POA: Diagnosis not present

## 2017-08-08 DIAGNOSIS — L97522 Non-pressure chronic ulcer of other part of left foot with fat layer exposed: Secondary | ICD-10-CM | POA: Diagnosis not present

## 2017-08-08 DIAGNOSIS — M868X7 Other osteomyelitis, ankle and foot: Secondary | ICD-10-CM | POA: Diagnosis not present

## 2017-08-08 LAB — GLUCOSE, CAPILLARY
Glucose-Capillary: 161 mg/dL — ABNORMAL HIGH (ref 65–99)
Glucose-Capillary: 139 mg/dL — ABNORMAL HIGH (ref 65–99)

## 2017-08-09 ENCOUNTER — Telehealth: Payer: Self-pay | Admitting: *Deleted

## 2017-08-09 DIAGNOSIS — E11621 Type 2 diabetes mellitus with foot ulcer: Secondary | ICD-10-CM | POA: Diagnosis not present

## 2017-08-09 DIAGNOSIS — I132 Hypertensive heart and chronic kidney disease with heart failure and with stage 5 chronic kidney disease, or end stage renal disease: Secondary | ICD-10-CM | POA: Diagnosis not present

## 2017-08-09 DIAGNOSIS — M86372 Chronic multifocal osteomyelitis, left ankle and foot: Secondary | ICD-10-CM | POA: Diagnosis not present

## 2017-08-09 DIAGNOSIS — E1122 Type 2 diabetes mellitus with diabetic chronic kidney disease: Secondary | ICD-10-CM | POA: Diagnosis not present

## 2017-08-09 DIAGNOSIS — N186 End stage renal disease: Secondary | ICD-10-CM | POA: Diagnosis not present

## 2017-08-09 DIAGNOSIS — L97522 Non-pressure chronic ulcer of other part of left foot with fat layer exposed: Secondary | ICD-10-CM | POA: Diagnosis not present

## 2017-08-09 DIAGNOSIS — L97524 Non-pressure chronic ulcer of other part of left foot with necrosis of bone: Secondary | ICD-10-CM | POA: Diagnosis not present

## 2017-08-09 LAB — GLUCOSE, CAPILLARY
GLUCOSE-CAPILLARY: 140 mg/dL — AB (ref 65–99)
Glucose-Capillary: 182 mg/dL — ABNORMAL HIGH (ref 65–99)

## 2017-08-09 NOTE — Telephone Encounter (Signed)
Patient called for clarification. He thought he was supposed to get 6 weeks of vancomycin/fortaz via hemodialysis with oral flagyl. He states that Dialysis told him they only have orders for 13 doses, 7/16- 8/10. Please advise if patient should continue for the full 6 weeks, which would have him completing therapy 8/20. Patient uses [facility] Mon-Wed-Fri. [phone] Landis Gandy, RN

## 2017-08-10 DIAGNOSIS — L97524 Non-pressure chronic ulcer of other part of left foot with necrosis of bone: Secondary | ICD-10-CM | POA: Diagnosis not present

## 2017-08-10 DIAGNOSIS — M868X7 Other osteomyelitis, ankle and foot: Secondary | ICD-10-CM | POA: Diagnosis not present

## 2017-08-10 DIAGNOSIS — L97522 Non-pressure chronic ulcer of other part of left foot with fat layer exposed: Secondary | ICD-10-CM | POA: Diagnosis not present

## 2017-08-10 DIAGNOSIS — E1122 Type 2 diabetes mellitus with diabetic chronic kidney disease: Secondary | ICD-10-CM | POA: Diagnosis not present

## 2017-08-10 DIAGNOSIS — E1121 Type 2 diabetes mellitus with diabetic nephropathy: Secondary | ICD-10-CM | POA: Diagnosis not present

## 2017-08-10 DIAGNOSIS — N2581 Secondary hyperparathyroidism of renal origin: Secondary | ICD-10-CM | POA: Diagnosis not present

## 2017-08-10 DIAGNOSIS — I132 Hypertensive heart and chronic kidney disease with heart failure and with stage 5 chronic kidney disease, or end stage renal disease: Secondary | ICD-10-CM | POA: Diagnosis not present

## 2017-08-10 DIAGNOSIS — D631 Anemia in chronic kidney disease: Secondary | ICD-10-CM | POA: Diagnosis not present

## 2017-08-10 DIAGNOSIS — M86372 Chronic multifocal osteomyelitis, left ankle and foot: Secondary | ICD-10-CM | POA: Diagnosis not present

## 2017-08-10 DIAGNOSIS — E11621 Type 2 diabetes mellitus with foot ulcer: Secondary | ICD-10-CM | POA: Diagnosis not present

## 2017-08-10 DIAGNOSIS — N186 End stage renal disease: Secondary | ICD-10-CM | POA: Diagnosis not present

## 2017-08-10 LAB — GLUCOSE, CAPILLARY
GLUCOSE-CAPILLARY: 168 mg/dL — AB (ref 65–99)
GLUCOSE-CAPILLARY: 144 mg/dL — AB (ref 65–99)

## 2017-08-10 NOTE — Telephone Encounter (Signed)
Patient notified, grateful for your help!

## 2017-08-10 NOTE — Telephone Encounter (Signed)
His antibiotic orders did not originate with ID.  I called his Hd center and asked them to complete his anbx on 8-20 (6 weeks total) Thanks jeff

## 2017-08-11 DIAGNOSIS — N186 End stage renal disease: Secondary | ICD-10-CM | POA: Diagnosis not present

## 2017-08-11 DIAGNOSIS — M86372 Chronic multifocal osteomyelitis, left ankle and foot: Secondary | ICD-10-CM | POA: Diagnosis not present

## 2017-08-11 DIAGNOSIS — L97512 Non-pressure chronic ulcer of other part of right foot with fat layer exposed: Secondary | ICD-10-CM | POA: Diagnosis not present

## 2017-08-11 DIAGNOSIS — I519 Heart disease, unspecified: Secondary | ICD-10-CM | POA: Diagnosis not present

## 2017-08-11 DIAGNOSIS — L97522 Non-pressure chronic ulcer of other part of left foot with fat layer exposed: Secondary | ICD-10-CM | POA: Diagnosis not present

## 2017-08-11 DIAGNOSIS — L97524 Non-pressure chronic ulcer of other part of left foot with necrosis of bone: Secondary | ICD-10-CM | POA: Diagnosis not present

## 2017-08-11 DIAGNOSIS — E1122 Type 2 diabetes mellitus with diabetic chronic kidney disease: Secondary | ICD-10-CM | POA: Diagnosis not present

## 2017-08-11 DIAGNOSIS — E11621 Type 2 diabetes mellitus with foot ulcer: Secondary | ICD-10-CM | POA: Diagnosis not present

## 2017-08-11 DIAGNOSIS — I132 Hypertensive heart and chronic kidney disease with heart failure and with stage 5 chronic kidney disease, or end stage renal disease: Secondary | ICD-10-CM | POA: Diagnosis not present

## 2017-08-11 LAB — GLUCOSE, CAPILLARY
GLUCOSE-CAPILLARY: 191 mg/dL — AB (ref 65–99)
Glucose-Capillary: 149 mg/dL — ABNORMAL HIGH (ref 65–99)

## 2017-08-12 DIAGNOSIS — E11621 Type 2 diabetes mellitus with foot ulcer: Secondary | ICD-10-CM | POA: Diagnosis not present

## 2017-08-12 DIAGNOSIS — M868X7 Other osteomyelitis, ankle and foot: Secondary | ICD-10-CM | POA: Diagnosis not present

## 2017-08-12 DIAGNOSIS — L97524 Non-pressure chronic ulcer of other part of left foot with necrosis of bone: Secondary | ICD-10-CM | POA: Diagnosis not present

## 2017-08-12 DIAGNOSIS — E1122 Type 2 diabetes mellitus with diabetic chronic kidney disease: Secondary | ICD-10-CM | POA: Diagnosis not present

## 2017-08-12 DIAGNOSIS — D631 Anemia in chronic kidney disease: Secondary | ICD-10-CM | POA: Diagnosis not present

## 2017-08-12 DIAGNOSIS — E1121 Type 2 diabetes mellitus with diabetic nephropathy: Secondary | ICD-10-CM | POA: Diagnosis not present

## 2017-08-12 DIAGNOSIS — I132 Hypertensive heart and chronic kidney disease with heart failure and with stage 5 chronic kidney disease, or end stage renal disease: Secondary | ICD-10-CM | POA: Diagnosis not present

## 2017-08-12 DIAGNOSIS — N2581 Secondary hyperparathyroidism of renal origin: Secondary | ICD-10-CM | POA: Diagnosis not present

## 2017-08-12 DIAGNOSIS — M86372 Chronic multifocal osteomyelitis, left ankle and foot: Secondary | ICD-10-CM | POA: Diagnosis not present

## 2017-08-12 DIAGNOSIS — L97522 Non-pressure chronic ulcer of other part of left foot with fat layer exposed: Secondary | ICD-10-CM | POA: Diagnosis not present

## 2017-08-12 DIAGNOSIS — N186 End stage renal disease: Secondary | ICD-10-CM | POA: Diagnosis not present

## 2017-08-12 LAB — GLUCOSE, CAPILLARY
Glucose-Capillary: 149 mg/dL — ABNORMAL HIGH (ref 65–99)
Glucose-Capillary: 149 mg/dL — ABNORMAL HIGH (ref 65–99)

## 2017-08-13 DIAGNOSIS — I519 Heart disease, unspecified: Secondary | ICD-10-CM | POA: Diagnosis not present

## 2017-08-13 DIAGNOSIS — N186 End stage renal disease: Secondary | ICD-10-CM | POA: Diagnosis not present

## 2017-08-13 DIAGNOSIS — L97512 Non-pressure chronic ulcer of other part of right foot with fat layer exposed: Secondary | ICD-10-CM | POA: Diagnosis not present

## 2017-08-13 DIAGNOSIS — L97522 Non-pressure chronic ulcer of other part of left foot with fat layer exposed: Secondary | ICD-10-CM | POA: Diagnosis not present

## 2017-08-13 DIAGNOSIS — E11621 Type 2 diabetes mellitus with foot ulcer: Secondary | ICD-10-CM | POA: Diagnosis not present

## 2017-08-13 DIAGNOSIS — E1122 Type 2 diabetes mellitus with diabetic chronic kidney disease: Secondary | ICD-10-CM | POA: Diagnosis not present

## 2017-08-15 DIAGNOSIS — E11621 Type 2 diabetes mellitus with foot ulcer: Secondary | ICD-10-CM | POA: Diagnosis not present

## 2017-08-15 DIAGNOSIS — I132 Hypertensive heart and chronic kidney disease with heart failure and with stage 5 chronic kidney disease, or end stage renal disease: Secondary | ICD-10-CM | POA: Diagnosis not present

## 2017-08-15 DIAGNOSIS — N186 End stage renal disease: Secondary | ICD-10-CM | POA: Diagnosis not present

## 2017-08-15 DIAGNOSIS — N2581 Secondary hyperparathyroidism of renal origin: Secondary | ICD-10-CM | POA: Diagnosis not present

## 2017-08-15 DIAGNOSIS — L97522 Non-pressure chronic ulcer of other part of left foot with fat layer exposed: Secondary | ICD-10-CM | POA: Diagnosis not present

## 2017-08-15 DIAGNOSIS — M86372 Chronic multifocal osteomyelitis, left ankle and foot: Secondary | ICD-10-CM | POA: Diagnosis not present

## 2017-08-15 DIAGNOSIS — E1121 Type 2 diabetes mellitus with diabetic nephropathy: Secondary | ICD-10-CM | POA: Diagnosis not present

## 2017-08-15 DIAGNOSIS — L97524 Non-pressure chronic ulcer of other part of left foot with necrosis of bone: Secondary | ICD-10-CM | POA: Diagnosis not present

## 2017-08-15 DIAGNOSIS — D631 Anemia in chronic kidney disease: Secondary | ICD-10-CM | POA: Diagnosis not present

## 2017-08-15 DIAGNOSIS — E1122 Type 2 diabetes mellitus with diabetic chronic kidney disease: Secondary | ICD-10-CM | POA: Diagnosis not present

## 2017-08-15 DIAGNOSIS — M868X7 Other osteomyelitis, ankle and foot: Secondary | ICD-10-CM | POA: Diagnosis not present

## 2017-08-15 LAB — GLUCOSE, CAPILLARY
GLUCOSE-CAPILLARY: 139 mg/dL — AB (ref 65–99)
Glucose-Capillary: 179 mg/dL — ABNORMAL HIGH (ref 65–99)

## 2017-08-16 DIAGNOSIS — L97522 Non-pressure chronic ulcer of other part of left foot with fat layer exposed: Secondary | ICD-10-CM | POA: Diagnosis not present

## 2017-08-16 DIAGNOSIS — M86372 Chronic multifocal osteomyelitis, left ankle and foot: Secondary | ICD-10-CM | POA: Diagnosis not present

## 2017-08-16 DIAGNOSIS — N186 End stage renal disease: Secondary | ICD-10-CM | POA: Diagnosis not present

## 2017-08-16 DIAGNOSIS — L97524 Non-pressure chronic ulcer of other part of left foot with necrosis of bone: Secondary | ICD-10-CM | POA: Diagnosis not present

## 2017-08-16 DIAGNOSIS — I132 Hypertensive heart and chronic kidney disease with heart failure and with stage 5 chronic kidney disease, or end stage renal disease: Secondary | ICD-10-CM | POA: Diagnosis not present

## 2017-08-16 DIAGNOSIS — L97526 Non-pressure chronic ulcer of other part of left foot with bone involvement without evidence of necrosis: Secondary | ICD-10-CM | POA: Diagnosis not present

## 2017-08-16 DIAGNOSIS — E1122 Type 2 diabetes mellitus with diabetic chronic kidney disease: Secondary | ICD-10-CM | POA: Diagnosis not present

## 2017-08-16 DIAGNOSIS — E11621 Type 2 diabetes mellitus with foot ulcer: Secondary | ICD-10-CM | POA: Diagnosis not present

## 2017-08-16 LAB — GLUCOSE, CAPILLARY
GLUCOSE-CAPILLARY: 178 mg/dL — AB (ref 65–99)
GLUCOSE-CAPILLARY: 190 mg/dL — AB (ref 65–99)

## 2017-08-17 DIAGNOSIS — E1121 Type 2 diabetes mellitus with diabetic nephropathy: Secondary | ICD-10-CM | POA: Diagnosis not present

## 2017-08-17 DIAGNOSIS — N186 End stage renal disease: Secondary | ICD-10-CM | POA: Diagnosis not present

## 2017-08-17 DIAGNOSIS — D631 Anemia in chronic kidney disease: Secondary | ICD-10-CM | POA: Diagnosis not present

## 2017-08-17 DIAGNOSIS — L97522 Non-pressure chronic ulcer of other part of left foot with fat layer exposed: Secondary | ICD-10-CM | POA: Diagnosis not present

## 2017-08-17 DIAGNOSIS — E1122 Type 2 diabetes mellitus with diabetic chronic kidney disease: Secondary | ICD-10-CM | POA: Diagnosis not present

## 2017-08-17 DIAGNOSIS — I132 Hypertensive heart and chronic kidney disease with heart failure and with stage 5 chronic kidney disease, or end stage renal disease: Secondary | ICD-10-CM | POA: Diagnosis not present

## 2017-08-17 DIAGNOSIS — M86372 Chronic multifocal osteomyelitis, left ankle and foot: Secondary | ICD-10-CM | POA: Diagnosis not present

## 2017-08-17 DIAGNOSIS — M868X7 Other osteomyelitis, ankle and foot: Secondary | ICD-10-CM | POA: Diagnosis not present

## 2017-08-17 DIAGNOSIS — N2581 Secondary hyperparathyroidism of renal origin: Secondary | ICD-10-CM | POA: Diagnosis not present

## 2017-08-17 DIAGNOSIS — E11621 Type 2 diabetes mellitus with foot ulcer: Secondary | ICD-10-CM | POA: Diagnosis not present

## 2017-08-17 DIAGNOSIS — L97524 Non-pressure chronic ulcer of other part of left foot with necrosis of bone: Secondary | ICD-10-CM | POA: Diagnosis not present

## 2017-08-17 LAB — GLUCOSE, CAPILLARY
GLUCOSE-CAPILLARY: 205 mg/dL — AB (ref 65–99)
GLUCOSE-CAPILLARY: 127 mg/dL — AB (ref 65–99)

## 2017-08-18 DIAGNOSIS — L97524 Non-pressure chronic ulcer of other part of left foot with necrosis of bone: Secondary | ICD-10-CM | POA: Diagnosis not present

## 2017-08-18 DIAGNOSIS — I519 Heart disease, unspecified: Secondary | ICD-10-CM | POA: Diagnosis not present

## 2017-08-18 DIAGNOSIS — M79672 Pain in left foot: Secondary | ICD-10-CM | POA: Diagnosis not present

## 2017-08-18 DIAGNOSIS — Z9889 Other specified postprocedural states: Secondary | ICD-10-CM | POA: Diagnosis not present

## 2017-08-18 DIAGNOSIS — L97512 Non-pressure chronic ulcer of other part of right foot with fat layer exposed: Secondary | ICD-10-CM | POA: Diagnosis not present

## 2017-08-18 DIAGNOSIS — E1122 Type 2 diabetes mellitus with diabetic chronic kidney disease: Secondary | ICD-10-CM | POA: Diagnosis not present

## 2017-08-18 DIAGNOSIS — E11621 Type 2 diabetes mellitus with foot ulcer: Secondary | ICD-10-CM | POA: Diagnosis not present

## 2017-08-18 DIAGNOSIS — M86372 Chronic multifocal osteomyelitis, left ankle and foot: Secondary | ICD-10-CM | POA: Diagnosis not present

## 2017-08-18 DIAGNOSIS — L97522 Non-pressure chronic ulcer of other part of left foot with fat layer exposed: Secondary | ICD-10-CM | POA: Diagnosis not present

## 2017-08-18 DIAGNOSIS — N186 End stage renal disease: Secondary | ICD-10-CM | POA: Diagnosis not present

## 2017-08-18 DIAGNOSIS — I132 Hypertensive heart and chronic kidney disease with heart failure and with stage 5 chronic kidney disease, or end stage renal disease: Secondary | ICD-10-CM | POA: Diagnosis not present

## 2017-08-18 LAB — GLUCOSE, CAPILLARY
GLUCOSE-CAPILLARY: 218 mg/dL — AB (ref 65–99)
Glucose-Capillary: 124 mg/dL — ABNORMAL HIGH (ref 65–99)

## 2017-08-19 DIAGNOSIS — L97522 Non-pressure chronic ulcer of other part of left foot with fat layer exposed: Secondary | ICD-10-CM | POA: Diagnosis not present

## 2017-08-19 DIAGNOSIS — D631 Anemia in chronic kidney disease: Secondary | ICD-10-CM | POA: Diagnosis not present

## 2017-08-19 DIAGNOSIS — M868X7 Other osteomyelitis, ankle and foot: Secondary | ICD-10-CM | POA: Diagnosis not present

## 2017-08-19 DIAGNOSIS — E1121 Type 2 diabetes mellitus with diabetic nephropathy: Secondary | ICD-10-CM | POA: Diagnosis not present

## 2017-08-19 DIAGNOSIS — I132 Hypertensive heart and chronic kidney disease with heart failure and with stage 5 chronic kidney disease, or end stage renal disease: Secondary | ICD-10-CM | POA: Diagnosis not present

## 2017-08-19 DIAGNOSIS — L97524 Non-pressure chronic ulcer of other part of left foot with necrosis of bone: Secondary | ICD-10-CM | POA: Diagnosis not present

## 2017-08-19 DIAGNOSIS — N2581 Secondary hyperparathyroidism of renal origin: Secondary | ICD-10-CM | POA: Diagnosis not present

## 2017-08-19 DIAGNOSIS — N186 End stage renal disease: Secondary | ICD-10-CM | POA: Diagnosis not present

## 2017-08-19 DIAGNOSIS — M86372 Chronic multifocal osteomyelitis, left ankle and foot: Secondary | ICD-10-CM | POA: Diagnosis not present

## 2017-08-19 DIAGNOSIS — E1122 Type 2 diabetes mellitus with diabetic chronic kidney disease: Secondary | ICD-10-CM | POA: Diagnosis not present

## 2017-08-19 DIAGNOSIS — E11621 Type 2 diabetes mellitus with foot ulcer: Secondary | ICD-10-CM | POA: Diagnosis not present

## 2017-08-19 LAB — GLUCOSE, CAPILLARY
GLUCOSE-CAPILLARY: 171 mg/dL — AB (ref 65–99)
Glucose-Capillary: 165 mg/dL — ABNORMAL HIGH (ref 65–99)

## 2017-08-20 DIAGNOSIS — L97522 Non-pressure chronic ulcer of other part of left foot with fat layer exposed: Secondary | ICD-10-CM | POA: Diagnosis not present

## 2017-08-20 DIAGNOSIS — N186 End stage renal disease: Secondary | ICD-10-CM | POA: Diagnosis not present

## 2017-08-20 DIAGNOSIS — I519 Heart disease, unspecified: Secondary | ICD-10-CM | POA: Diagnosis not present

## 2017-08-20 DIAGNOSIS — L97512 Non-pressure chronic ulcer of other part of right foot with fat layer exposed: Secondary | ICD-10-CM | POA: Diagnosis not present

## 2017-08-20 DIAGNOSIS — E1122 Type 2 diabetes mellitus with diabetic chronic kidney disease: Secondary | ICD-10-CM | POA: Diagnosis not present

## 2017-08-20 DIAGNOSIS — E11621 Type 2 diabetes mellitus with foot ulcer: Secondary | ICD-10-CM | POA: Diagnosis not present

## 2017-08-22 DIAGNOSIS — N186 End stage renal disease: Secondary | ICD-10-CM | POA: Diagnosis not present

## 2017-08-22 DIAGNOSIS — D631 Anemia in chronic kidney disease: Secondary | ICD-10-CM | POA: Diagnosis not present

## 2017-08-22 DIAGNOSIS — Z992 Dependence on renal dialysis: Secondary | ICD-10-CM | POA: Diagnosis not present

## 2017-08-22 DIAGNOSIS — E1121 Type 2 diabetes mellitus with diabetic nephropathy: Secondary | ICD-10-CM | POA: Diagnosis not present

## 2017-08-22 DIAGNOSIS — E11621 Type 2 diabetes mellitus with foot ulcer: Secondary | ICD-10-CM | POA: Diagnosis not present

## 2017-08-22 DIAGNOSIS — M868X7 Other osteomyelitis, ankle and foot: Secondary | ICD-10-CM | POA: Diagnosis not present

## 2017-08-22 DIAGNOSIS — N2581 Secondary hyperparathyroidism of renal origin: Secondary | ICD-10-CM | POA: Diagnosis not present

## 2017-08-22 DIAGNOSIS — I132 Hypertensive heart and chronic kidney disease with heart failure and with stage 5 chronic kidney disease, or end stage renal disease: Secondary | ICD-10-CM | POA: Diagnosis not present

## 2017-08-22 DIAGNOSIS — E1122 Type 2 diabetes mellitus with diabetic chronic kidney disease: Secondary | ICD-10-CM | POA: Diagnosis not present

## 2017-08-22 DIAGNOSIS — L97522 Non-pressure chronic ulcer of other part of left foot with fat layer exposed: Secondary | ICD-10-CM | POA: Diagnosis not present

## 2017-08-22 DIAGNOSIS — L97524 Non-pressure chronic ulcer of other part of left foot with necrosis of bone: Secondary | ICD-10-CM | POA: Diagnosis not present

## 2017-08-22 DIAGNOSIS — M86372 Chronic multifocal osteomyelitis, left ankle and foot: Secondary | ICD-10-CM | POA: Diagnosis not present

## 2017-08-22 DIAGNOSIS — Z794 Long term (current) use of insulin: Secondary | ICD-10-CM | POA: Diagnosis not present

## 2017-08-22 DIAGNOSIS — I519 Heart disease, unspecified: Secondary | ICD-10-CM | POA: Diagnosis not present

## 2017-08-22 LAB — GLUCOSE, CAPILLARY
GLUCOSE-CAPILLARY: 160 mg/dL — AB (ref 65–99)
Glucose-Capillary: 111 mg/dL — ABNORMAL HIGH (ref 65–99)

## 2017-08-23 DIAGNOSIS — E11621 Type 2 diabetes mellitus with foot ulcer: Secondary | ICD-10-CM | POA: Diagnosis not present

## 2017-08-23 DIAGNOSIS — I132 Hypertensive heart and chronic kidney disease with heart failure and with stage 5 chronic kidney disease, or end stage renal disease: Secondary | ICD-10-CM | POA: Diagnosis not present

## 2017-08-23 DIAGNOSIS — E1122 Type 2 diabetes mellitus with diabetic chronic kidney disease: Secondary | ICD-10-CM | POA: Diagnosis not present

## 2017-08-23 DIAGNOSIS — L97522 Non-pressure chronic ulcer of other part of left foot with fat layer exposed: Secondary | ICD-10-CM | POA: Diagnosis not present

## 2017-08-23 DIAGNOSIS — N186 End stage renal disease: Secondary | ICD-10-CM | POA: Diagnosis not present

## 2017-08-23 DIAGNOSIS — M86372 Chronic multifocal osteomyelitis, left ankle and foot: Secondary | ICD-10-CM | POA: Diagnosis not present

## 2017-08-23 DIAGNOSIS — L97524 Non-pressure chronic ulcer of other part of left foot with necrosis of bone: Secondary | ICD-10-CM | POA: Diagnosis not present

## 2017-08-23 LAB — GLUCOSE, CAPILLARY
GLUCOSE-CAPILLARY: 129 mg/dL — AB (ref 65–99)
Glucose-Capillary: 114 mg/dL — ABNORMAL HIGH (ref 65–99)
Glucose-Capillary: 109 mg/dL — ABNORMAL HIGH (ref 65–99)
Glucose-Capillary: 160 mg/dL — ABNORMAL HIGH (ref 65–99)

## 2017-08-24 DIAGNOSIS — L97522 Non-pressure chronic ulcer of other part of left foot with fat layer exposed: Secondary | ICD-10-CM | POA: Diagnosis not present

## 2017-08-24 DIAGNOSIS — D631 Anemia in chronic kidney disease: Secondary | ICD-10-CM | POA: Diagnosis not present

## 2017-08-24 DIAGNOSIS — M868X7 Other osteomyelitis, ankle and foot: Secondary | ICD-10-CM | POA: Diagnosis not present

## 2017-08-24 DIAGNOSIS — E11621 Type 2 diabetes mellitus with foot ulcer: Secondary | ICD-10-CM | POA: Diagnosis not present

## 2017-08-24 DIAGNOSIS — N186 End stage renal disease: Secondary | ICD-10-CM | POA: Diagnosis not present

## 2017-08-24 DIAGNOSIS — M86372 Chronic multifocal osteomyelitis, left ankle and foot: Secondary | ICD-10-CM | POA: Diagnosis not present

## 2017-08-24 DIAGNOSIS — E1121 Type 2 diabetes mellitus with diabetic nephropathy: Secondary | ICD-10-CM | POA: Diagnosis not present

## 2017-08-24 DIAGNOSIS — N2581 Secondary hyperparathyroidism of renal origin: Secondary | ICD-10-CM | POA: Diagnosis not present

## 2017-08-24 DIAGNOSIS — I132 Hypertensive heart and chronic kidney disease with heart failure and with stage 5 chronic kidney disease, or end stage renal disease: Secondary | ICD-10-CM | POA: Diagnosis not present

## 2017-08-24 DIAGNOSIS — L97524 Non-pressure chronic ulcer of other part of left foot with necrosis of bone: Secondary | ICD-10-CM | POA: Diagnosis not present

## 2017-08-24 DIAGNOSIS — E1122 Type 2 diabetes mellitus with diabetic chronic kidney disease: Secondary | ICD-10-CM | POA: Diagnosis not present

## 2017-08-24 LAB — GLUCOSE, CAPILLARY
GLUCOSE-CAPILLARY: 150 mg/dL — AB (ref 65–99)
GLUCOSE-CAPILLARY: 113 mg/dL — AB (ref 65–99)

## 2017-08-25 DIAGNOSIS — M86372 Chronic multifocal osteomyelitis, left ankle and foot: Secondary | ICD-10-CM | POA: Diagnosis not present

## 2017-08-25 DIAGNOSIS — I132 Hypertensive heart and chronic kidney disease with heart failure and with stage 5 chronic kidney disease, or end stage renal disease: Secondary | ICD-10-CM | POA: Diagnosis not present

## 2017-08-25 DIAGNOSIS — I519 Heart disease, unspecified: Secondary | ICD-10-CM | POA: Diagnosis not present

## 2017-08-25 DIAGNOSIS — L97524 Non-pressure chronic ulcer of other part of left foot with necrosis of bone: Secondary | ICD-10-CM | POA: Diagnosis not present

## 2017-08-25 DIAGNOSIS — N186 End stage renal disease: Secondary | ICD-10-CM | POA: Diagnosis not present

## 2017-08-25 DIAGNOSIS — L97522 Non-pressure chronic ulcer of other part of left foot with fat layer exposed: Secondary | ICD-10-CM | POA: Diagnosis not present

## 2017-08-25 DIAGNOSIS — E1122 Type 2 diabetes mellitus with diabetic chronic kidney disease: Secondary | ICD-10-CM | POA: Diagnosis not present

## 2017-08-25 DIAGNOSIS — E11621 Type 2 diabetes mellitus with foot ulcer: Secondary | ICD-10-CM | POA: Diagnosis not present

## 2017-08-25 LAB — GLUCOSE, CAPILLARY
GLUCOSE-CAPILLARY: 167 mg/dL — AB (ref 65–99)
Glucose-Capillary: 165 mg/dL — ABNORMAL HIGH (ref 65–99)

## 2017-08-26 DIAGNOSIS — Z992 Dependence on renal dialysis: Secondary | ICD-10-CM | POA: Diagnosis not present

## 2017-08-26 DIAGNOSIS — E1122 Type 2 diabetes mellitus with diabetic chronic kidney disease: Secondary | ICD-10-CM | POA: Diagnosis not present

## 2017-08-26 DIAGNOSIS — M868X7 Other osteomyelitis, ankle and foot: Secondary | ICD-10-CM | POA: Diagnosis not present

## 2017-08-26 DIAGNOSIS — N186 End stage renal disease: Secondary | ICD-10-CM | POA: Diagnosis not present

## 2017-08-26 DIAGNOSIS — D631 Anemia in chronic kidney disease: Secondary | ICD-10-CM | POA: Diagnosis not present

## 2017-08-26 DIAGNOSIS — E1121 Type 2 diabetes mellitus with diabetic nephropathy: Secondary | ICD-10-CM | POA: Diagnosis not present

## 2017-08-26 DIAGNOSIS — N2581 Secondary hyperparathyroidism of renal origin: Secondary | ICD-10-CM | POA: Diagnosis not present

## 2017-08-27 DIAGNOSIS — L97522 Non-pressure chronic ulcer of other part of left foot with fat layer exposed: Secondary | ICD-10-CM | POA: Diagnosis not present

## 2017-08-27 DIAGNOSIS — N186 End stage renal disease: Secondary | ICD-10-CM | POA: Diagnosis not present

## 2017-08-27 DIAGNOSIS — E1122 Type 2 diabetes mellitus with diabetic chronic kidney disease: Secondary | ICD-10-CM | POA: Diagnosis not present

## 2017-08-27 DIAGNOSIS — E11621 Type 2 diabetes mellitus with foot ulcer: Secondary | ICD-10-CM | POA: Diagnosis not present

## 2017-08-27 DIAGNOSIS — I519 Heart disease, unspecified: Secondary | ICD-10-CM | POA: Diagnosis not present

## 2017-08-30 ENCOUNTER — Encounter (HOSPITAL_BASED_OUTPATIENT_CLINIC_OR_DEPARTMENT_OTHER): Payer: Medicare Other | Attending: Surgery

## 2017-08-30 DIAGNOSIS — Z89432 Acquired absence of left foot: Secondary | ICD-10-CM | POA: Diagnosis not present

## 2017-08-30 DIAGNOSIS — E11621 Type 2 diabetes mellitus with foot ulcer: Secondary | ICD-10-CM | POA: Insufficient documentation

## 2017-08-30 DIAGNOSIS — E1169 Type 2 diabetes mellitus with other specified complication: Secondary | ICD-10-CM | POA: Diagnosis not present

## 2017-08-30 DIAGNOSIS — L97524 Non-pressure chronic ulcer of other part of left foot with necrosis of bone: Secondary | ICD-10-CM | POA: Diagnosis not present

## 2017-08-30 DIAGNOSIS — L97522 Non-pressure chronic ulcer of other part of left foot with fat layer exposed: Secondary | ICD-10-CM | POA: Insufficient documentation

## 2017-08-30 DIAGNOSIS — N185 Chronic kidney disease, stage 5: Secondary | ICD-10-CM | POA: Diagnosis not present

## 2017-08-30 DIAGNOSIS — M86372 Chronic multifocal osteomyelitis, left ankle and foot: Secondary | ICD-10-CM | POA: Diagnosis not present

## 2017-08-30 DIAGNOSIS — E1122 Type 2 diabetes mellitus with diabetic chronic kidney disease: Secondary | ICD-10-CM | POA: Insufficient documentation

## 2017-08-30 DIAGNOSIS — Z992 Dependence on renal dialysis: Secondary | ICD-10-CM | POA: Insufficient documentation

## 2017-08-30 LAB — GLUCOSE, CAPILLARY
GLUCOSE-CAPILLARY: 159 mg/dL — AB (ref 65–99)
GLUCOSE-CAPILLARY: 117 mg/dL — AB (ref 65–99)

## 2017-08-31 DIAGNOSIS — E1121 Type 2 diabetes mellitus with diabetic nephropathy: Secondary | ICD-10-CM | POA: Diagnosis not present

## 2017-08-31 DIAGNOSIS — M86372 Chronic multifocal osteomyelitis, left ankle and foot: Secondary | ICD-10-CM | POA: Diagnosis not present

## 2017-08-31 DIAGNOSIS — L97522 Non-pressure chronic ulcer of other part of left foot with fat layer exposed: Secondary | ICD-10-CM | POA: Diagnosis not present

## 2017-08-31 DIAGNOSIS — N2581 Secondary hyperparathyroidism of renal origin: Secondary | ICD-10-CM | POA: Diagnosis not present

## 2017-08-31 DIAGNOSIS — E1122 Type 2 diabetes mellitus with diabetic chronic kidney disease: Secondary | ICD-10-CM | POA: Diagnosis not present

## 2017-08-31 DIAGNOSIS — N186 End stage renal disease: Secondary | ICD-10-CM | POA: Diagnosis not present

## 2017-08-31 DIAGNOSIS — E1169 Type 2 diabetes mellitus with other specified complication: Secondary | ICD-10-CM | POA: Diagnosis not present

## 2017-08-31 DIAGNOSIS — Z23 Encounter for immunization: Secondary | ICD-10-CM | POA: Diagnosis not present

## 2017-08-31 DIAGNOSIS — N185 Chronic kidney disease, stage 5: Secondary | ICD-10-CM | POA: Diagnosis not present

## 2017-08-31 DIAGNOSIS — D631 Anemia in chronic kidney disease: Secondary | ICD-10-CM | POA: Diagnosis not present

## 2017-08-31 DIAGNOSIS — E11621 Type 2 diabetes mellitus with foot ulcer: Secondary | ICD-10-CM | POA: Diagnosis not present

## 2017-08-31 LAB — GLUCOSE, CAPILLARY: GLUCOSE-CAPILLARY: 118 mg/dL — AB (ref 65–99)

## 2017-09-01 DIAGNOSIS — E1169 Type 2 diabetes mellitus with other specified complication: Secondary | ICD-10-CM | POA: Diagnosis not present

## 2017-09-01 DIAGNOSIS — L97522 Non-pressure chronic ulcer of other part of left foot with fat layer exposed: Secondary | ICD-10-CM | POA: Diagnosis not present

## 2017-09-01 DIAGNOSIS — I519 Heart disease, unspecified: Secondary | ICD-10-CM | POA: Diagnosis not present

## 2017-09-01 DIAGNOSIS — E1122 Type 2 diabetes mellitus with diabetic chronic kidney disease: Secondary | ICD-10-CM | POA: Diagnosis not present

## 2017-09-01 DIAGNOSIS — M86372 Chronic multifocal osteomyelitis, left ankle and foot: Secondary | ICD-10-CM | POA: Diagnosis not present

## 2017-09-01 DIAGNOSIS — N185 Chronic kidney disease, stage 5: Secondary | ICD-10-CM | POA: Diagnosis not present

## 2017-09-01 DIAGNOSIS — N186 End stage renal disease: Secondary | ICD-10-CM | POA: Diagnosis not present

## 2017-09-01 DIAGNOSIS — E11621 Type 2 diabetes mellitus with foot ulcer: Secondary | ICD-10-CM | POA: Diagnosis not present

## 2017-09-01 LAB — GLUCOSE, CAPILLARY
GLUCOSE-CAPILLARY: 176 mg/dL — AB (ref 65–99)
Glucose-Capillary: 178 mg/dL — ABNORMAL HIGH (ref 65–99)

## 2017-09-02 DIAGNOSIS — D631 Anemia in chronic kidney disease: Secondary | ICD-10-CM | POA: Diagnosis not present

## 2017-09-02 DIAGNOSIS — Z23 Encounter for immunization: Secondary | ICD-10-CM | POA: Diagnosis not present

## 2017-09-02 DIAGNOSIS — N186 End stage renal disease: Secondary | ICD-10-CM | POA: Diagnosis not present

## 2017-09-02 DIAGNOSIS — E1121 Type 2 diabetes mellitus with diabetic nephropathy: Secondary | ICD-10-CM | POA: Diagnosis not present

## 2017-09-02 DIAGNOSIS — N2581 Secondary hyperparathyroidism of renal origin: Secondary | ICD-10-CM | POA: Diagnosis not present

## 2017-09-03 DIAGNOSIS — E11621 Type 2 diabetes mellitus with foot ulcer: Secondary | ICD-10-CM | POA: Diagnosis not present

## 2017-09-03 DIAGNOSIS — E1122 Type 2 diabetes mellitus with diabetic chronic kidney disease: Secondary | ICD-10-CM | POA: Diagnosis not present

## 2017-09-03 DIAGNOSIS — I519 Heart disease, unspecified: Secondary | ICD-10-CM | POA: Diagnosis not present

## 2017-09-03 DIAGNOSIS — N186 End stage renal disease: Secondary | ICD-10-CM | POA: Diagnosis not present

## 2017-09-03 DIAGNOSIS — L97522 Non-pressure chronic ulcer of other part of left foot with fat layer exposed: Secondary | ICD-10-CM | POA: Diagnosis not present

## 2017-09-05 DIAGNOSIS — E1169 Type 2 diabetes mellitus with other specified complication: Secondary | ICD-10-CM | POA: Diagnosis not present

## 2017-09-05 DIAGNOSIS — E1121 Type 2 diabetes mellitus with diabetic nephropathy: Secondary | ICD-10-CM | POA: Diagnosis not present

## 2017-09-05 DIAGNOSIS — L97524 Non-pressure chronic ulcer of other part of left foot with necrosis of bone: Secondary | ICD-10-CM | POA: Diagnosis not present

## 2017-09-05 DIAGNOSIS — E11621 Type 2 diabetes mellitus with foot ulcer: Secondary | ICD-10-CM | POA: Diagnosis not present

## 2017-09-05 DIAGNOSIS — E1122 Type 2 diabetes mellitus with diabetic chronic kidney disease: Secondary | ICD-10-CM | POA: Diagnosis not present

## 2017-09-05 DIAGNOSIS — M86372 Chronic multifocal osteomyelitis, left ankle and foot: Secondary | ICD-10-CM | POA: Diagnosis not present

## 2017-09-05 DIAGNOSIS — Z23 Encounter for immunization: Secondary | ICD-10-CM | POA: Diagnosis not present

## 2017-09-05 DIAGNOSIS — N185 Chronic kidney disease, stage 5: Secondary | ICD-10-CM | POA: Diagnosis not present

## 2017-09-05 DIAGNOSIS — N186 End stage renal disease: Secondary | ICD-10-CM | POA: Diagnosis not present

## 2017-09-05 DIAGNOSIS — D631 Anemia in chronic kidney disease: Secondary | ICD-10-CM | POA: Diagnosis not present

## 2017-09-05 DIAGNOSIS — N2581 Secondary hyperparathyroidism of renal origin: Secondary | ICD-10-CM | POA: Diagnosis not present

## 2017-09-05 DIAGNOSIS — L97522 Non-pressure chronic ulcer of other part of left foot with fat layer exposed: Secondary | ICD-10-CM | POA: Diagnosis not present

## 2017-09-05 LAB — GLUCOSE, CAPILLARY
GLUCOSE-CAPILLARY: 127 mg/dL — AB (ref 65–99)
Glucose-Capillary: 118 mg/dL — ABNORMAL HIGH (ref 65–99)
Glucose-Capillary: 129 mg/dL — ABNORMAL HIGH (ref 65–99)
Glucose-Capillary: 111 mg/dL — ABNORMAL HIGH (ref 65–99)

## 2017-09-06 DIAGNOSIS — E1169 Type 2 diabetes mellitus with other specified complication: Secondary | ICD-10-CM | POA: Diagnosis not present

## 2017-09-06 DIAGNOSIS — M86372 Chronic multifocal osteomyelitis, left ankle and foot: Secondary | ICD-10-CM | POA: Diagnosis not present

## 2017-09-06 DIAGNOSIS — L97524 Non-pressure chronic ulcer of other part of left foot with necrosis of bone: Secondary | ICD-10-CM | POA: Diagnosis not present

## 2017-09-06 DIAGNOSIS — E1122 Type 2 diabetes mellitus with diabetic chronic kidney disease: Secondary | ICD-10-CM | POA: Diagnosis not present

## 2017-09-06 DIAGNOSIS — N185 Chronic kidney disease, stage 5: Secondary | ICD-10-CM | POA: Diagnosis not present

## 2017-09-06 DIAGNOSIS — E11621 Type 2 diabetes mellitus with foot ulcer: Secondary | ICD-10-CM | POA: Diagnosis not present

## 2017-09-06 DIAGNOSIS — L97522 Non-pressure chronic ulcer of other part of left foot with fat layer exposed: Secondary | ICD-10-CM | POA: Diagnosis not present

## 2017-09-06 LAB — GLUCOSE, CAPILLARY
GLUCOSE-CAPILLARY: 219 mg/dL — AB (ref 65–99)
GLUCOSE-CAPILLARY: 197 mg/dL — AB (ref 65–99)

## 2017-09-07 DIAGNOSIS — E1169 Type 2 diabetes mellitus with other specified complication: Secondary | ICD-10-CM | POA: Diagnosis not present

## 2017-09-07 DIAGNOSIS — E1122 Type 2 diabetes mellitus with diabetic chronic kidney disease: Secondary | ICD-10-CM | POA: Diagnosis not present

## 2017-09-07 DIAGNOSIS — Z23 Encounter for immunization: Secondary | ICD-10-CM | POA: Diagnosis not present

## 2017-09-07 DIAGNOSIS — N2581 Secondary hyperparathyroidism of renal origin: Secondary | ICD-10-CM | POA: Diagnosis not present

## 2017-09-07 DIAGNOSIS — E1121 Type 2 diabetes mellitus with diabetic nephropathy: Secondary | ICD-10-CM | POA: Diagnosis not present

## 2017-09-07 DIAGNOSIS — N185 Chronic kidney disease, stage 5: Secondary | ICD-10-CM | POA: Diagnosis not present

## 2017-09-07 DIAGNOSIS — N186 End stage renal disease: Secondary | ICD-10-CM | POA: Diagnosis not present

## 2017-09-07 DIAGNOSIS — L97524 Non-pressure chronic ulcer of other part of left foot with necrosis of bone: Secondary | ICD-10-CM | POA: Diagnosis not present

## 2017-09-07 DIAGNOSIS — L97522 Non-pressure chronic ulcer of other part of left foot with fat layer exposed: Secondary | ICD-10-CM | POA: Diagnosis not present

## 2017-09-07 DIAGNOSIS — D631 Anemia in chronic kidney disease: Secondary | ICD-10-CM | POA: Diagnosis not present

## 2017-09-07 DIAGNOSIS — E11621 Type 2 diabetes mellitus with foot ulcer: Secondary | ICD-10-CM | POA: Diagnosis not present

## 2017-09-07 DIAGNOSIS — M86372 Chronic multifocal osteomyelitis, left ankle and foot: Secondary | ICD-10-CM | POA: Diagnosis not present

## 2017-09-07 LAB — GLUCOSE, CAPILLARY
GLUCOSE-CAPILLARY: 151 mg/dL — AB (ref 65–99)
Glucose-Capillary: 121 mg/dL — ABNORMAL HIGH (ref 65–99)
Glucose-Capillary: 153 mg/dL — ABNORMAL HIGH (ref 65–99)

## 2017-09-08 DIAGNOSIS — L97524 Non-pressure chronic ulcer of other part of left foot with necrosis of bone: Secondary | ICD-10-CM | POA: Diagnosis not present

## 2017-09-08 DIAGNOSIS — E11621 Type 2 diabetes mellitus with foot ulcer: Secondary | ICD-10-CM | POA: Diagnosis not present

## 2017-09-08 DIAGNOSIS — L97522 Non-pressure chronic ulcer of other part of left foot with fat layer exposed: Secondary | ICD-10-CM | POA: Diagnosis not present

## 2017-09-08 DIAGNOSIS — I519 Heart disease, unspecified: Secondary | ICD-10-CM | POA: Diagnosis not present

## 2017-09-08 DIAGNOSIS — N186 End stage renal disease: Secondary | ICD-10-CM | POA: Diagnosis not present

## 2017-09-08 DIAGNOSIS — E1169 Type 2 diabetes mellitus with other specified complication: Secondary | ICD-10-CM | POA: Diagnosis not present

## 2017-09-08 DIAGNOSIS — M86372 Chronic multifocal osteomyelitis, left ankle and foot: Secondary | ICD-10-CM | POA: Diagnosis not present

## 2017-09-08 DIAGNOSIS — N185 Chronic kidney disease, stage 5: Secondary | ICD-10-CM | POA: Diagnosis not present

## 2017-09-08 DIAGNOSIS — E1122 Type 2 diabetes mellitus with diabetic chronic kidney disease: Secondary | ICD-10-CM | POA: Diagnosis not present

## 2017-09-08 LAB — GLUCOSE, CAPILLARY
Glucose-Capillary: 175 mg/dL — ABNORMAL HIGH (ref 65–99)
Glucose-Capillary: 130 mg/dL — ABNORMAL HIGH (ref 65–99)

## 2017-09-09 DIAGNOSIS — D631 Anemia in chronic kidney disease: Secondary | ICD-10-CM | POA: Diagnosis not present

## 2017-09-09 DIAGNOSIS — N2581 Secondary hyperparathyroidism of renal origin: Secondary | ICD-10-CM | POA: Diagnosis not present

## 2017-09-09 DIAGNOSIS — L97522 Non-pressure chronic ulcer of other part of left foot with fat layer exposed: Secondary | ICD-10-CM | POA: Diagnosis not present

## 2017-09-09 DIAGNOSIS — E11621 Type 2 diabetes mellitus with foot ulcer: Secondary | ICD-10-CM | POA: Diagnosis not present

## 2017-09-09 DIAGNOSIS — Z23 Encounter for immunization: Secondary | ICD-10-CM | POA: Diagnosis not present

## 2017-09-09 DIAGNOSIS — N185 Chronic kidney disease, stage 5: Secondary | ICD-10-CM | POA: Diagnosis not present

## 2017-09-09 DIAGNOSIS — L97524 Non-pressure chronic ulcer of other part of left foot with necrosis of bone: Secondary | ICD-10-CM | POA: Diagnosis not present

## 2017-09-09 DIAGNOSIS — M86372 Chronic multifocal osteomyelitis, left ankle and foot: Secondary | ICD-10-CM | POA: Diagnosis not present

## 2017-09-09 DIAGNOSIS — E1121 Type 2 diabetes mellitus with diabetic nephropathy: Secondary | ICD-10-CM | POA: Diagnosis not present

## 2017-09-09 DIAGNOSIS — E1122 Type 2 diabetes mellitus with diabetic chronic kidney disease: Secondary | ICD-10-CM | POA: Diagnosis not present

## 2017-09-09 DIAGNOSIS — N186 End stage renal disease: Secondary | ICD-10-CM | POA: Diagnosis not present

## 2017-09-09 DIAGNOSIS — E1169 Type 2 diabetes mellitus with other specified complication: Secondary | ICD-10-CM | POA: Diagnosis not present

## 2017-09-09 LAB — GLUCOSE, CAPILLARY
GLUCOSE-CAPILLARY: 138 mg/dL — AB (ref 65–99)
GLUCOSE-CAPILLARY: 142 mg/dL — AB (ref 65–99)

## 2017-09-10 DIAGNOSIS — N186 End stage renal disease: Secondary | ICD-10-CM | POA: Diagnosis not present

## 2017-09-10 DIAGNOSIS — E1122 Type 2 diabetes mellitus with diabetic chronic kidney disease: Secondary | ICD-10-CM | POA: Diagnosis not present

## 2017-09-10 DIAGNOSIS — I519 Heart disease, unspecified: Secondary | ICD-10-CM | POA: Diagnosis not present

## 2017-09-10 DIAGNOSIS — L97522 Non-pressure chronic ulcer of other part of left foot with fat layer exposed: Secondary | ICD-10-CM | POA: Diagnosis not present

## 2017-09-10 DIAGNOSIS — E11621 Type 2 diabetes mellitus with foot ulcer: Secondary | ICD-10-CM | POA: Diagnosis not present

## 2017-09-12 DIAGNOSIS — D631 Anemia in chronic kidney disease: Secondary | ICD-10-CM | POA: Diagnosis not present

## 2017-09-12 DIAGNOSIS — N2581 Secondary hyperparathyroidism of renal origin: Secondary | ICD-10-CM | POA: Diagnosis not present

## 2017-09-12 DIAGNOSIS — E1121 Type 2 diabetes mellitus with diabetic nephropathy: Secondary | ICD-10-CM | POA: Diagnosis not present

## 2017-09-12 DIAGNOSIS — Z23 Encounter for immunization: Secondary | ICD-10-CM | POA: Diagnosis not present

## 2017-09-12 DIAGNOSIS — N186 End stage renal disease: Secondary | ICD-10-CM | POA: Diagnosis not present

## 2017-09-13 DIAGNOSIS — E1169 Type 2 diabetes mellitus with other specified complication: Secondary | ICD-10-CM | POA: Diagnosis not present

## 2017-09-13 DIAGNOSIS — M86372 Chronic multifocal osteomyelitis, left ankle and foot: Secondary | ICD-10-CM | POA: Diagnosis not present

## 2017-09-13 DIAGNOSIS — N185 Chronic kidney disease, stage 5: Secondary | ICD-10-CM | POA: Diagnosis not present

## 2017-09-13 DIAGNOSIS — L97522 Non-pressure chronic ulcer of other part of left foot with fat layer exposed: Secondary | ICD-10-CM | POA: Diagnosis not present

## 2017-09-13 DIAGNOSIS — E11621 Type 2 diabetes mellitus with foot ulcer: Secondary | ICD-10-CM | POA: Diagnosis not present

## 2017-09-13 DIAGNOSIS — E1122 Type 2 diabetes mellitus with diabetic chronic kidney disease: Secondary | ICD-10-CM | POA: Diagnosis not present

## 2017-09-14 DIAGNOSIS — Z23 Encounter for immunization: Secondary | ICD-10-CM | POA: Diagnosis not present

## 2017-09-14 DIAGNOSIS — D631 Anemia in chronic kidney disease: Secondary | ICD-10-CM | POA: Diagnosis not present

## 2017-09-14 DIAGNOSIS — E1121 Type 2 diabetes mellitus with diabetic nephropathy: Secondary | ICD-10-CM | POA: Diagnosis not present

## 2017-09-14 DIAGNOSIS — N2581 Secondary hyperparathyroidism of renal origin: Secondary | ICD-10-CM | POA: Diagnosis not present

## 2017-09-14 DIAGNOSIS — N186 End stage renal disease: Secondary | ICD-10-CM | POA: Diagnosis not present

## 2017-09-16 DIAGNOSIS — Z23 Encounter for immunization: Secondary | ICD-10-CM | POA: Diagnosis not present

## 2017-09-16 DIAGNOSIS — D631 Anemia in chronic kidney disease: Secondary | ICD-10-CM | POA: Diagnosis not present

## 2017-09-16 DIAGNOSIS — N186 End stage renal disease: Secondary | ICD-10-CM | POA: Diagnosis not present

## 2017-09-16 DIAGNOSIS — N2581 Secondary hyperparathyroidism of renal origin: Secondary | ICD-10-CM | POA: Diagnosis not present

## 2017-09-16 DIAGNOSIS — E1121 Type 2 diabetes mellitus with diabetic nephropathy: Secondary | ICD-10-CM | POA: Diagnosis not present

## 2017-09-16 IMAGING — DX DG FOOT COMPLETE 3+V*L*
3 series · 3 of 3 positions shown · non-contrast
Comparison: 05/16/2017

CLINICAL DATA: Peripheral vascular disease, diabetic neuropathy,
LEFT toe amputation, irrigation, debridement, fever, history
hypertension, cardiomyopathy

EXAM:
LEFT FOOT - COMPLETE 3+ VIEW

[x foot ap left]
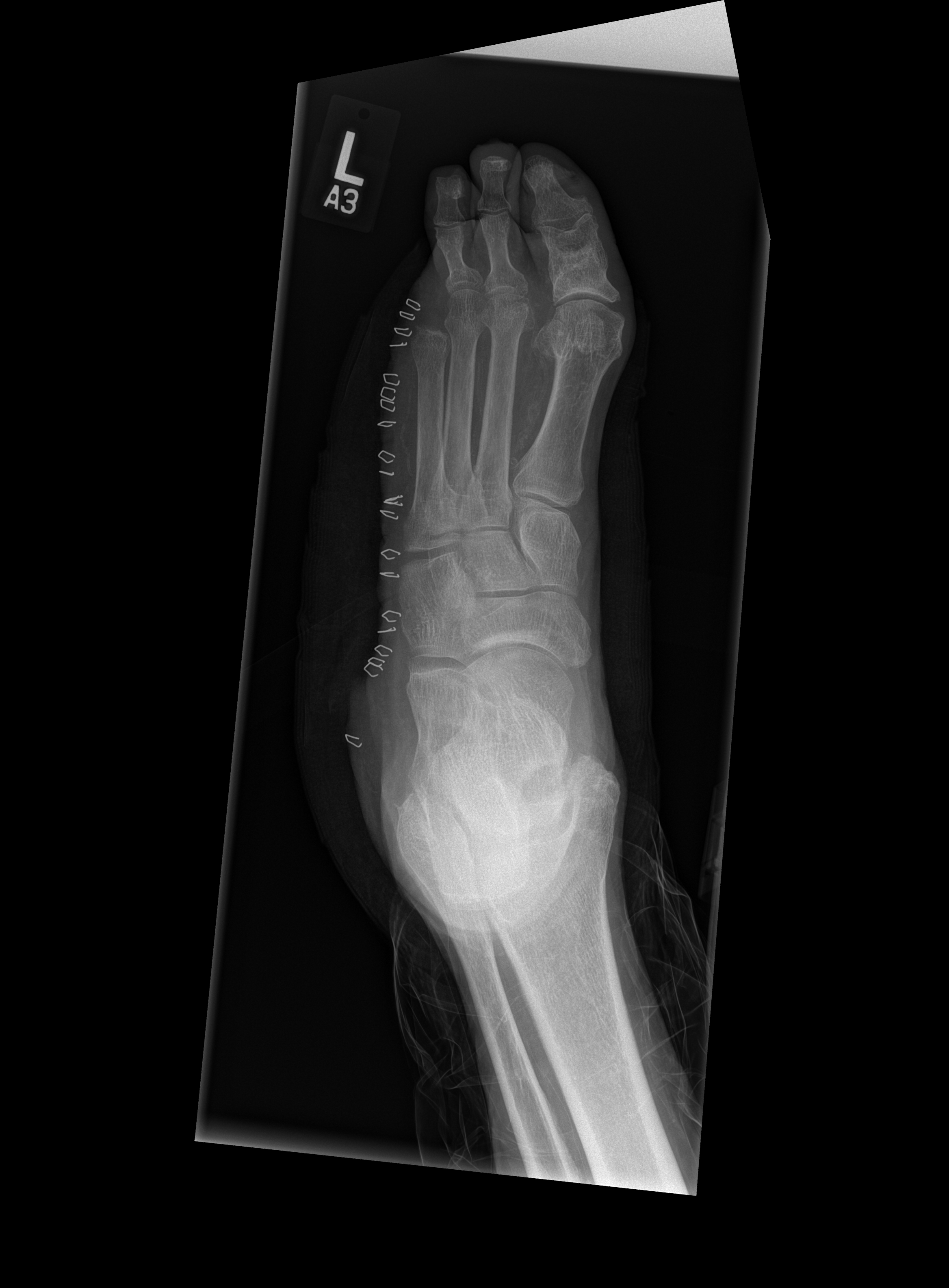

[x foot obl left]
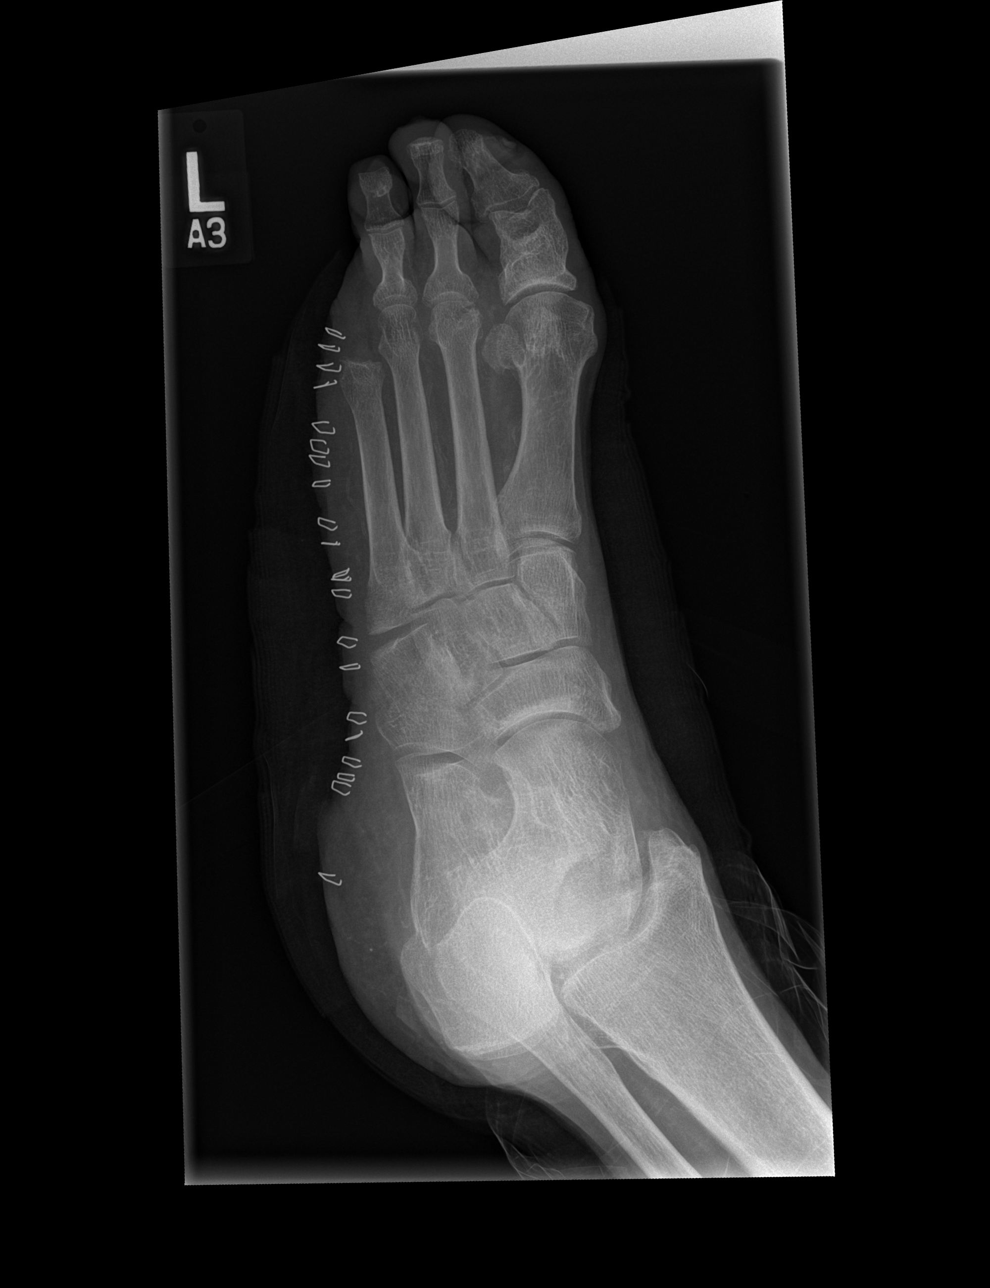

[x foot lat left]
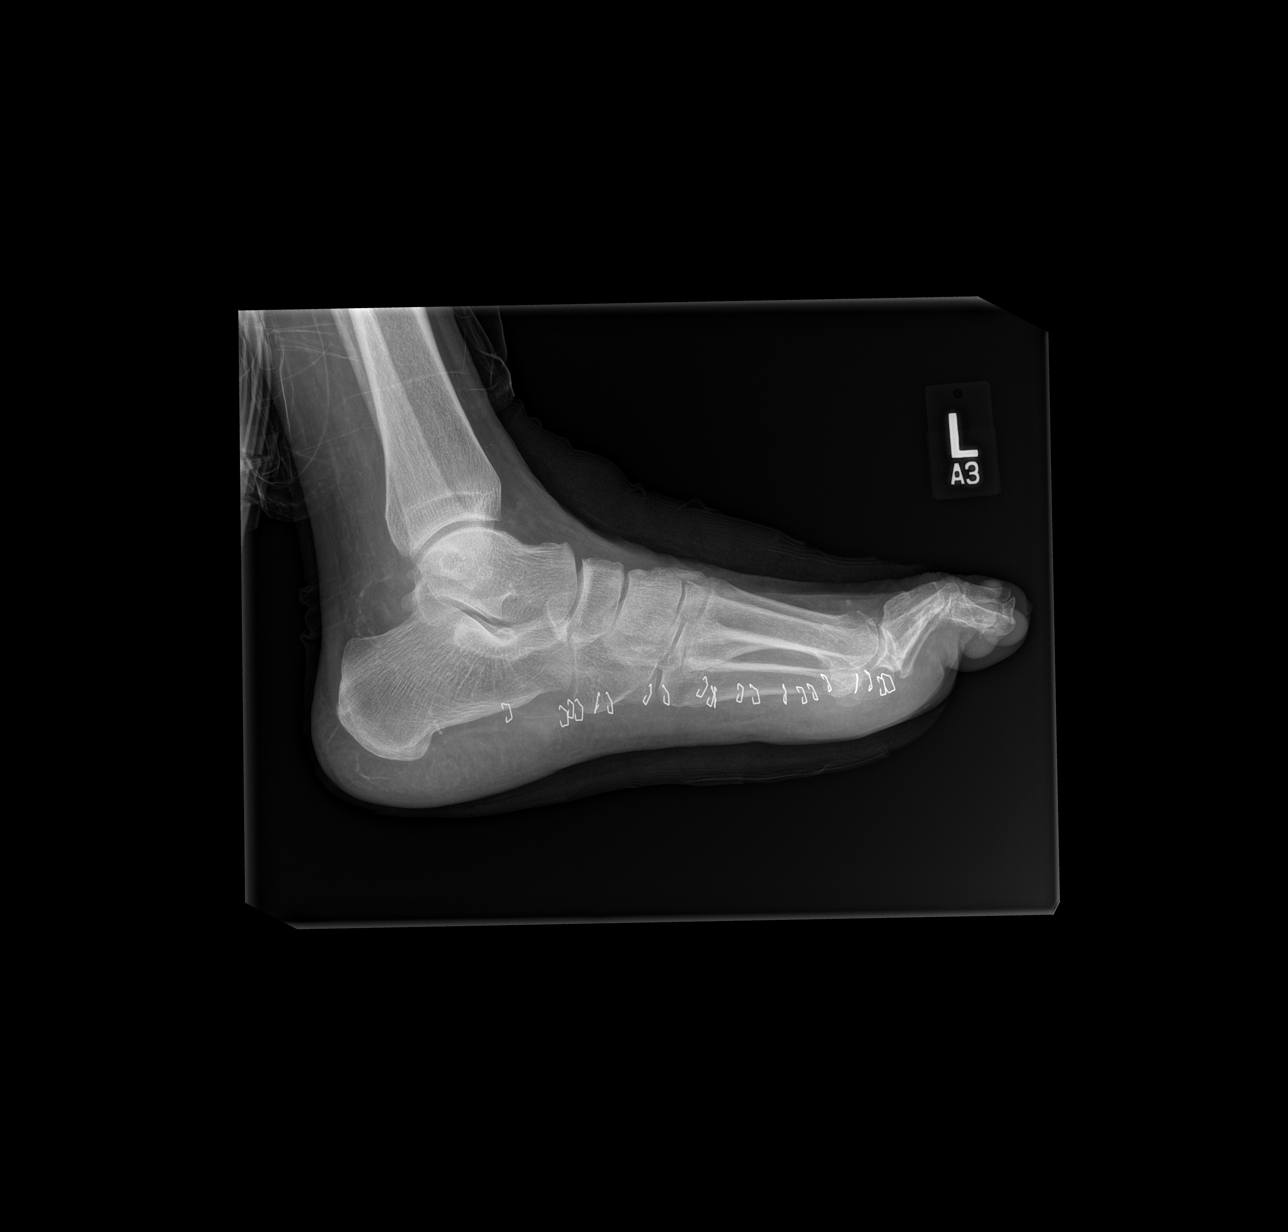

[3 of 3 positions shown; findings below may reference images not displayed]

FINDINGS: Interval amputation of the fifth ray soft tissue swelling, skin
clips and overlying dressing artifacts.

Prior amputation of fourth toe.

Diffuse osseous demineralization.

Old healed fracture of the proximal phalanx great toe.

No acute fracture or dislocation.

Cortical margination of the lateral base of the fourth metatarsal
and the lateral margin of the cuboid appears indistinct, could be
related to surgery but cannot exclude bone destruction /
osteomyelitis.

Scattered small vessel vascular calcifications.
IMPRESSION: Interval amputation of the fifth ray.

Question cortical loss at the lateral base of the fourth metatarsal
at the lateral margin of the cuboid, question related to preceding
surgery but unable to exclude subtle bone destruction/osteomyelitis.

## 2017-09-16 IMAGING — DX DG CHEST 2V
2 series · 2 of 2 positions shown · non-contrast
Comparison: 05/18/2017

CLINICAL DATA: Peripheral vascular disease, diabetic neuropathy,
fever, recent amputation LEFT foot, hypertension, cardiomyopathy

EXAM:
CHEST  2 VIEW

[x chest ap]
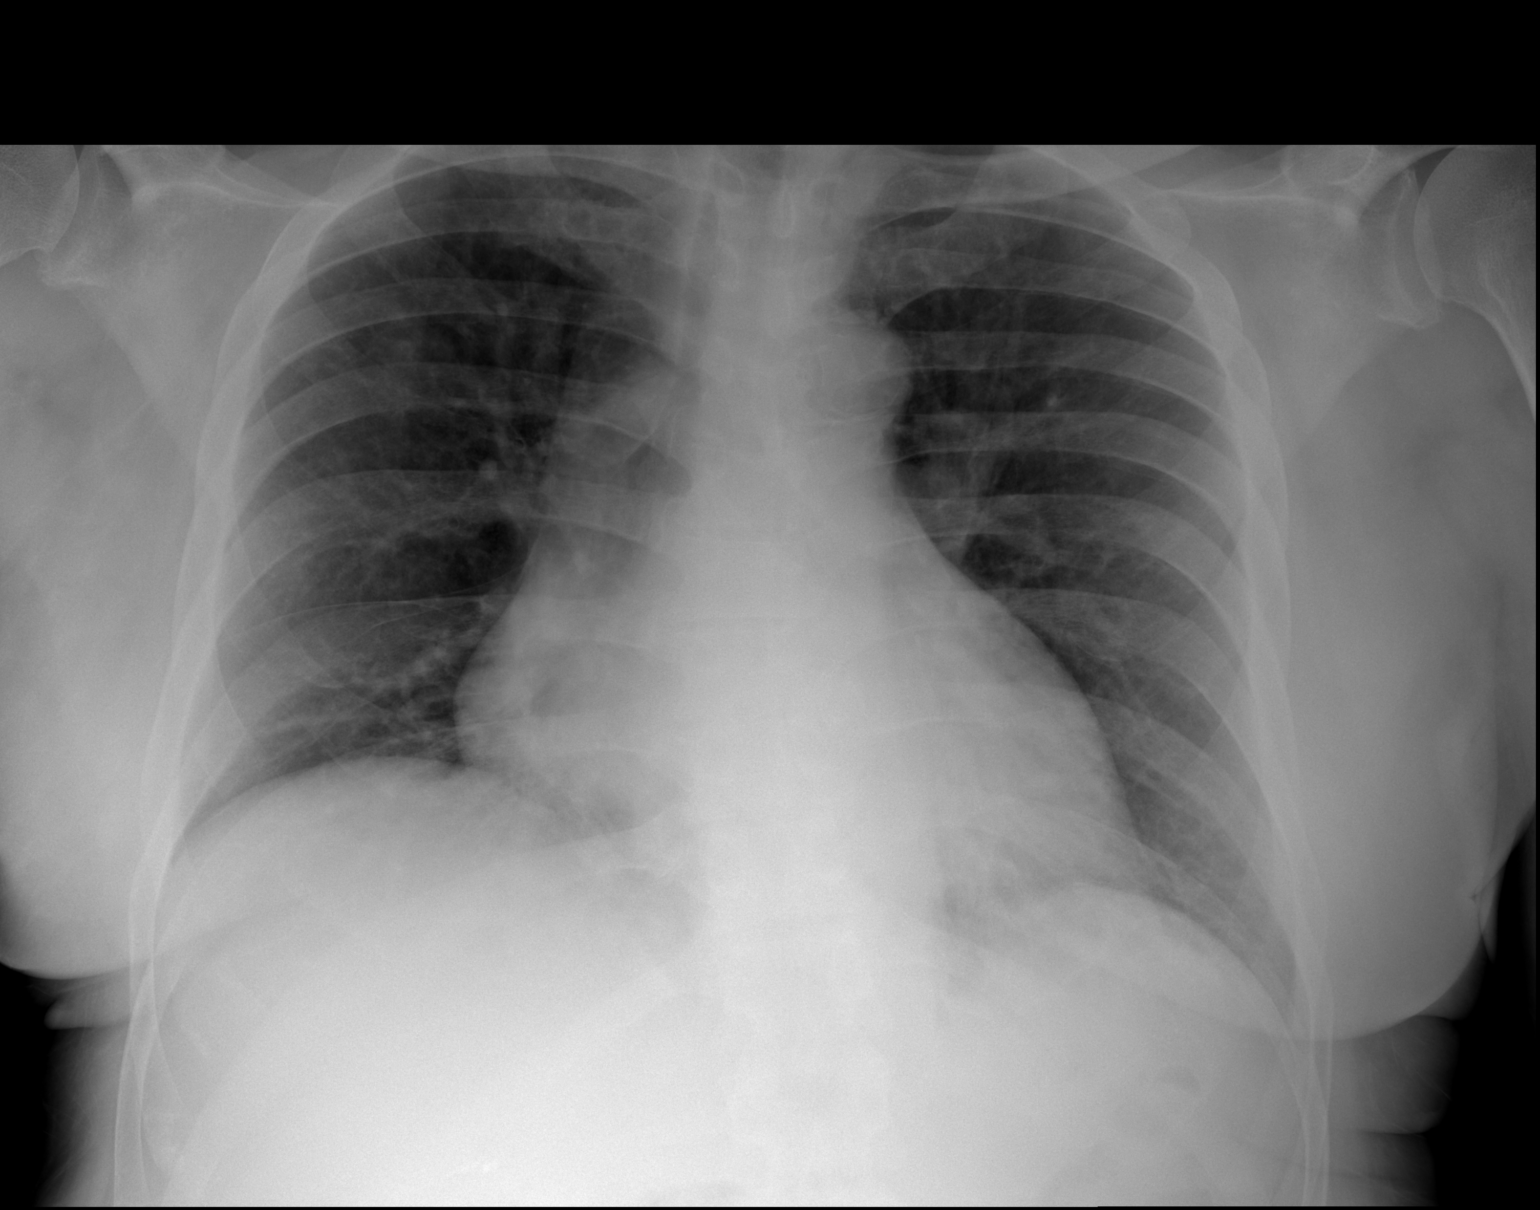

[w chest lat]
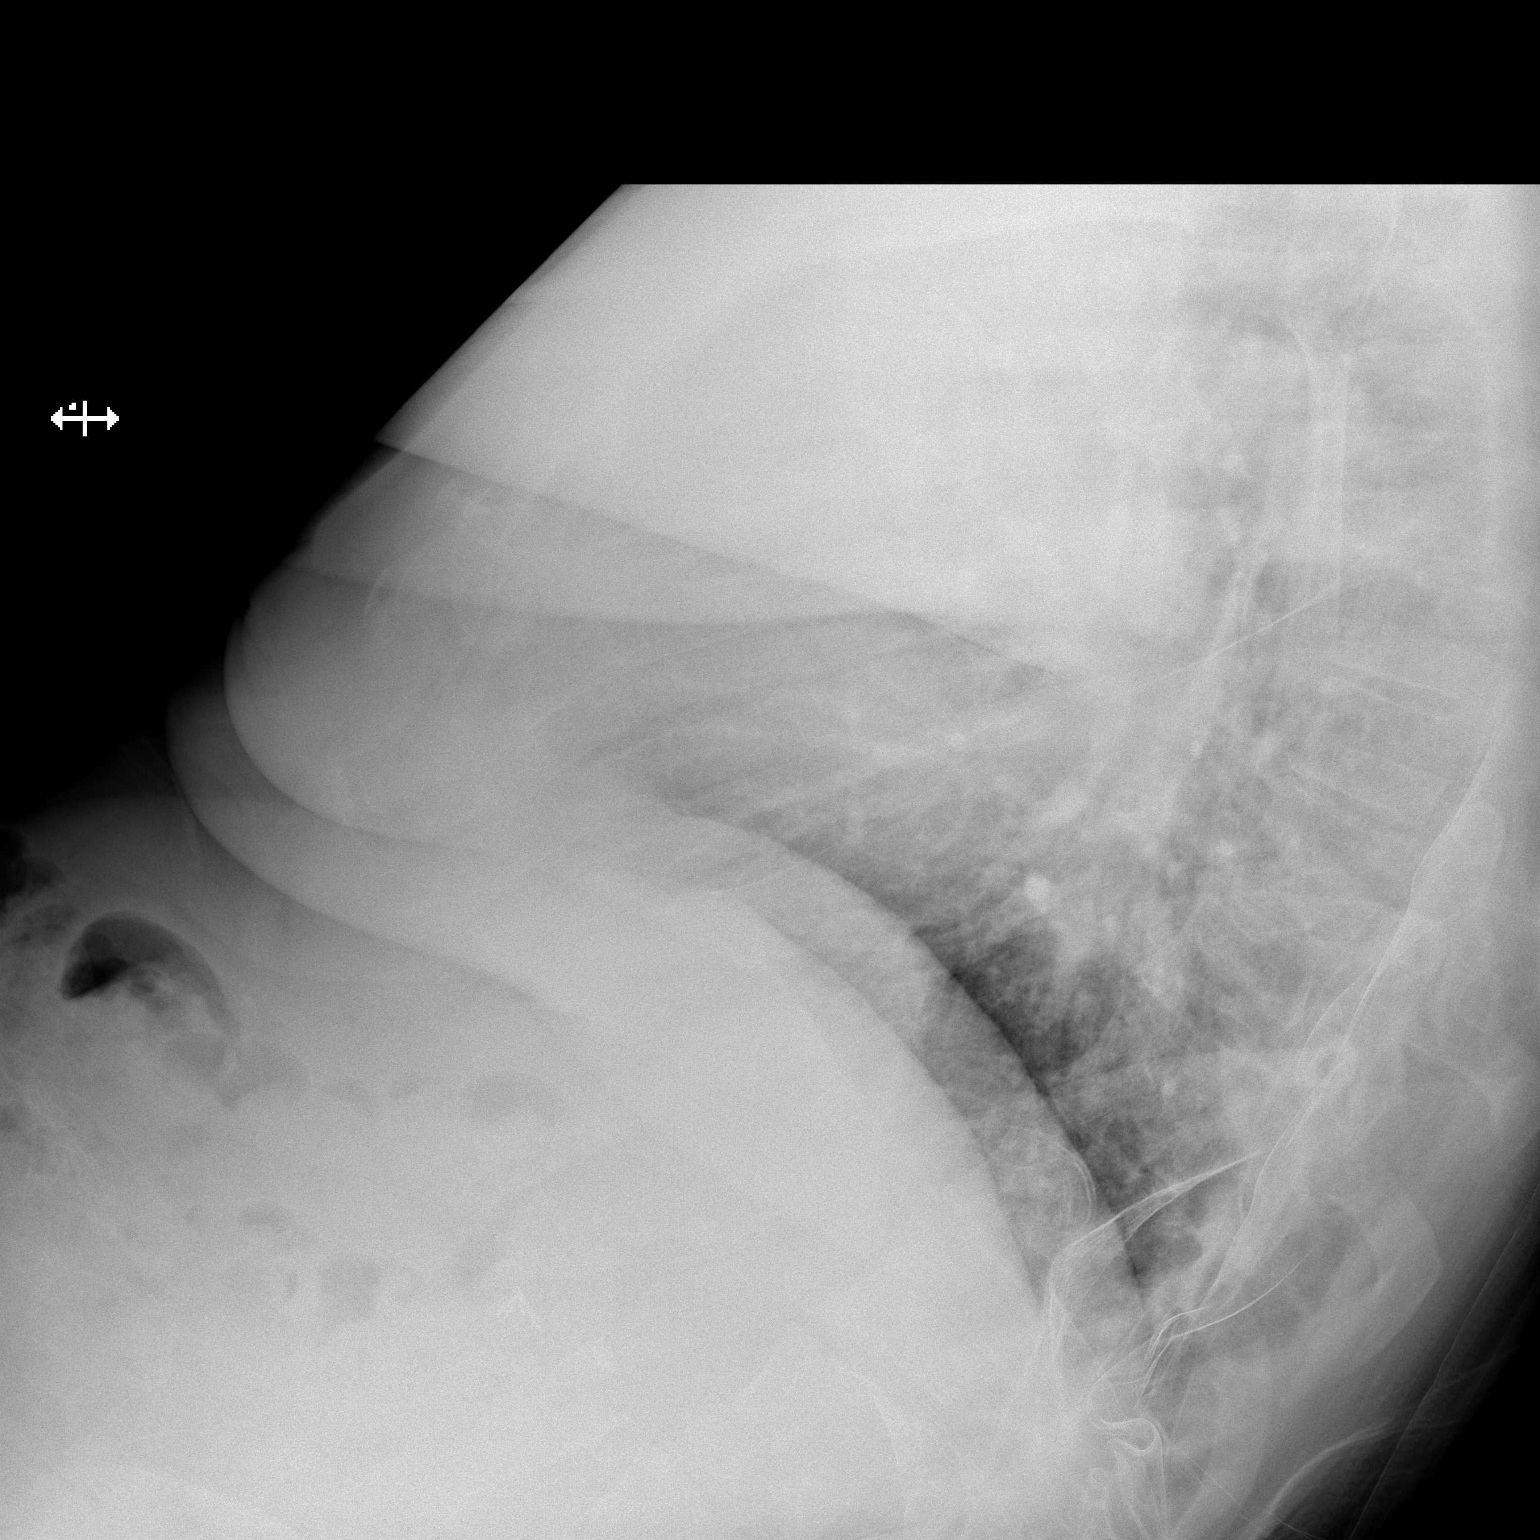

[2 of 2 positions shown; findings below may reference images not displayed]

FINDINGS: Enlargement of cardiac silhouette.

Atherosclerotic calcification aorta.

Pulmonary vascular congestion.

Lungs clear.

No pleural effusion or pneumothorax.

Bones unremarkable.
IMPRESSION: Enlargement of cardiac silhouette with pulmonary vascular
congestion.

Aortic atherosclerosis.

## 2017-09-17 DIAGNOSIS — I519 Heart disease, unspecified: Secondary | ICD-10-CM | POA: Diagnosis not present

## 2017-09-17 DIAGNOSIS — L97522 Non-pressure chronic ulcer of other part of left foot with fat layer exposed: Secondary | ICD-10-CM | POA: Diagnosis not present

## 2017-09-17 DIAGNOSIS — N186 End stage renal disease: Secondary | ICD-10-CM | POA: Diagnosis not present

## 2017-09-17 DIAGNOSIS — E11621 Type 2 diabetes mellitus with foot ulcer: Secondary | ICD-10-CM | POA: Diagnosis not present

## 2017-09-17 DIAGNOSIS — E1122 Type 2 diabetes mellitus with diabetic chronic kidney disease: Secondary | ICD-10-CM | POA: Diagnosis not present

## 2017-09-19 DIAGNOSIS — D631 Anemia in chronic kidney disease: Secondary | ICD-10-CM | POA: Diagnosis not present

## 2017-09-19 DIAGNOSIS — E1121 Type 2 diabetes mellitus with diabetic nephropathy: Secondary | ICD-10-CM | POA: Diagnosis not present

## 2017-09-19 DIAGNOSIS — N186 End stage renal disease: Secondary | ICD-10-CM | POA: Diagnosis not present

## 2017-09-19 DIAGNOSIS — N2581 Secondary hyperparathyroidism of renal origin: Secondary | ICD-10-CM | POA: Diagnosis not present

## 2017-09-19 DIAGNOSIS — Z23 Encounter for immunization: Secondary | ICD-10-CM | POA: Diagnosis not present

## 2017-09-20 DIAGNOSIS — N186 End stage renal disease: Secondary | ICD-10-CM | POA: Diagnosis not present

## 2017-09-20 DIAGNOSIS — E1121 Type 2 diabetes mellitus with diabetic nephropathy: Secondary | ICD-10-CM | POA: Diagnosis not present

## 2017-09-20 DIAGNOSIS — E1169 Type 2 diabetes mellitus with other specified complication: Secondary | ICD-10-CM | POA: Diagnosis not present

## 2017-09-20 DIAGNOSIS — M86372 Chronic multifocal osteomyelitis, left ankle and foot: Secondary | ICD-10-CM | POA: Diagnosis not present

## 2017-09-20 DIAGNOSIS — Z23 Encounter for immunization: Secondary | ICD-10-CM | POA: Diagnosis not present

## 2017-09-20 DIAGNOSIS — E11621 Type 2 diabetes mellitus with foot ulcer: Secondary | ICD-10-CM | POA: Diagnosis not present

## 2017-09-20 DIAGNOSIS — L97522 Non-pressure chronic ulcer of other part of left foot with fat layer exposed: Secondary | ICD-10-CM | POA: Diagnosis not present

## 2017-09-20 DIAGNOSIS — E1122 Type 2 diabetes mellitus with diabetic chronic kidney disease: Secondary | ICD-10-CM | POA: Diagnosis not present

## 2017-09-20 DIAGNOSIS — N2581 Secondary hyperparathyroidism of renal origin: Secondary | ICD-10-CM | POA: Diagnosis not present

## 2017-09-20 DIAGNOSIS — N185 Chronic kidney disease, stage 5: Secondary | ICD-10-CM | POA: Diagnosis not present

## 2017-09-20 DIAGNOSIS — D631 Anemia in chronic kidney disease: Secondary | ICD-10-CM | POA: Diagnosis not present

## 2017-09-21 DIAGNOSIS — Z23 Encounter for immunization: Secondary | ICD-10-CM | POA: Diagnosis not present

## 2017-09-21 DIAGNOSIS — N2581 Secondary hyperparathyroidism of renal origin: Secondary | ICD-10-CM | POA: Diagnosis not present

## 2017-09-21 DIAGNOSIS — E1121 Type 2 diabetes mellitus with diabetic nephropathy: Secondary | ICD-10-CM | POA: Diagnosis not present

## 2017-09-21 DIAGNOSIS — N186 End stage renal disease: Secondary | ICD-10-CM | POA: Diagnosis not present

## 2017-09-21 DIAGNOSIS — D631 Anemia in chronic kidney disease: Secondary | ICD-10-CM | POA: Diagnosis not present

## 2017-09-22 DIAGNOSIS — I519 Heart disease, unspecified: Secondary | ICD-10-CM | POA: Diagnosis not present

## 2017-09-22 DIAGNOSIS — E1122 Type 2 diabetes mellitus with diabetic chronic kidney disease: Secondary | ICD-10-CM | POA: Diagnosis not present

## 2017-09-22 DIAGNOSIS — N186 End stage renal disease: Secondary | ICD-10-CM | POA: Diagnosis not present

## 2017-09-22 DIAGNOSIS — L97522 Non-pressure chronic ulcer of other part of left foot with fat layer exposed: Secondary | ICD-10-CM | POA: Diagnosis not present

## 2017-09-22 DIAGNOSIS — E11621 Type 2 diabetes mellitus with foot ulcer: Secondary | ICD-10-CM | POA: Diagnosis not present

## 2017-09-23 DIAGNOSIS — N186 End stage renal disease: Secondary | ICD-10-CM | POA: Diagnosis not present

## 2017-09-23 DIAGNOSIS — N2581 Secondary hyperparathyroidism of renal origin: Secondary | ICD-10-CM | POA: Diagnosis not present

## 2017-09-23 DIAGNOSIS — D631 Anemia in chronic kidney disease: Secondary | ICD-10-CM | POA: Diagnosis not present

## 2017-09-23 DIAGNOSIS — E1121 Type 2 diabetes mellitus with diabetic nephropathy: Secondary | ICD-10-CM | POA: Diagnosis not present

## 2017-09-23 DIAGNOSIS — Z23 Encounter for immunization: Secondary | ICD-10-CM | POA: Diagnosis not present

## 2017-09-24 DIAGNOSIS — E11621 Type 2 diabetes mellitus with foot ulcer: Secondary | ICD-10-CM | POA: Diagnosis not present

## 2017-09-24 DIAGNOSIS — L97522 Non-pressure chronic ulcer of other part of left foot with fat layer exposed: Secondary | ICD-10-CM | POA: Diagnosis not present

## 2017-09-24 DIAGNOSIS — N186 End stage renal disease: Secondary | ICD-10-CM | POA: Diagnosis not present

## 2017-09-24 DIAGNOSIS — E1122 Type 2 diabetes mellitus with diabetic chronic kidney disease: Secondary | ICD-10-CM | POA: Diagnosis not present

## 2017-09-24 DIAGNOSIS — I519 Heart disease, unspecified: Secondary | ICD-10-CM | POA: Diagnosis not present

## 2017-09-25 DIAGNOSIS — Z992 Dependence on renal dialysis: Secondary | ICD-10-CM | POA: Diagnosis not present

## 2017-09-25 DIAGNOSIS — E1122 Type 2 diabetes mellitus with diabetic chronic kidney disease: Secondary | ICD-10-CM | POA: Diagnosis not present

## 2017-09-25 DIAGNOSIS — N186 End stage renal disease: Secondary | ICD-10-CM | POA: Diagnosis not present

## 2017-09-26 DIAGNOSIS — N2581 Secondary hyperparathyroidism of renal origin: Secondary | ICD-10-CM | POA: Diagnosis not present

## 2017-09-26 DIAGNOSIS — N186 End stage renal disease: Secondary | ICD-10-CM | POA: Diagnosis not present

## 2017-09-26 DIAGNOSIS — D631 Anemia in chronic kidney disease: Secondary | ICD-10-CM | POA: Diagnosis not present

## 2017-09-27 ENCOUNTER — Encounter (HOSPITAL_BASED_OUTPATIENT_CLINIC_OR_DEPARTMENT_OTHER): Payer: Medicare Other | Attending: Surgery

## 2017-09-27 DIAGNOSIS — E1151 Type 2 diabetes mellitus with diabetic peripheral angiopathy without gangrene: Secondary | ICD-10-CM | POA: Diagnosis not present

## 2017-09-27 DIAGNOSIS — M86372 Chronic multifocal osteomyelitis, left ankle and foot: Secondary | ICD-10-CM | POA: Diagnosis not present

## 2017-09-27 DIAGNOSIS — E11621 Type 2 diabetes mellitus with foot ulcer: Secondary | ICD-10-CM | POA: Insufficient documentation

## 2017-09-27 DIAGNOSIS — N186 End stage renal disease: Secondary | ICD-10-CM | POA: Insufficient documentation

## 2017-09-27 DIAGNOSIS — E1122 Type 2 diabetes mellitus with diabetic chronic kidney disease: Secondary | ICD-10-CM | POA: Diagnosis not present

## 2017-09-27 DIAGNOSIS — L97529 Non-pressure chronic ulcer of other part of left foot with unspecified severity: Secondary | ICD-10-CM | POA: Diagnosis not present

## 2017-09-27 DIAGNOSIS — I509 Heart failure, unspecified: Secondary | ICD-10-CM | POA: Insufficient documentation

## 2017-09-27 DIAGNOSIS — Z89432 Acquired absence of left foot: Secondary | ICD-10-CM | POA: Diagnosis not present

## 2017-09-27 DIAGNOSIS — Z992 Dependence on renal dialysis: Secondary | ICD-10-CM | POA: Insufficient documentation

## 2017-09-27 DIAGNOSIS — E1140 Type 2 diabetes mellitus with diabetic neuropathy, unspecified: Secondary | ICD-10-CM | POA: Insufficient documentation

## 2017-09-27 DIAGNOSIS — L97522 Non-pressure chronic ulcer of other part of left foot with fat layer exposed: Secondary | ICD-10-CM | POA: Diagnosis not present

## 2017-09-27 DIAGNOSIS — I132 Hypertensive heart and chronic kidney disease with heart failure and with stage 5 chronic kidney disease, or end stage renal disease: Secondary | ICD-10-CM | POA: Diagnosis not present

## 2017-09-28 DIAGNOSIS — N186 End stage renal disease: Secondary | ICD-10-CM | POA: Diagnosis not present

## 2017-09-28 DIAGNOSIS — N2581 Secondary hyperparathyroidism of renal origin: Secondary | ICD-10-CM | POA: Diagnosis not present

## 2017-09-28 DIAGNOSIS — D631 Anemia in chronic kidney disease: Secondary | ICD-10-CM | POA: Diagnosis not present

## 2017-09-29 DIAGNOSIS — L97522 Non-pressure chronic ulcer of other part of left foot with fat layer exposed: Secondary | ICD-10-CM | POA: Diagnosis not present

## 2017-09-29 DIAGNOSIS — N186 End stage renal disease: Secondary | ICD-10-CM | POA: Diagnosis not present

## 2017-09-29 DIAGNOSIS — I519 Heart disease, unspecified: Secondary | ICD-10-CM | POA: Diagnosis not present

## 2017-09-29 DIAGNOSIS — E11621 Type 2 diabetes mellitus with foot ulcer: Secondary | ICD-10-CM | POA: Diagnosis not present

## 2017-09-29 DIAGNOSIS — E1122 Type 2 diabetes mellitus with diabetic chronic kidney disease: Secondary | ICD-10-CM | POA: Diagnosis not present

## 2017-09-30 DIAGNOSIS — D631 Anemia in chronic kidney disease: Secondary | ICD-10-CM | POA: Diagnosis not present

## 2017-09-30 DIAGNOSIS — N186 End stage renal disease: Secondary | ICD-10-CM | POA: Diagnosis not present

## 2017-09-30 DIAGNOSIS — N2581 Secondary hyperparathyroidism of renal origin: Secondary | ICD-10-CM | POA: Diagnosis not present

## 2017-10-01 DIAGNOSIS — N186 End stage renal disease: Secondary | ICD-10-CM | POA: Diagnosis not present

## 2017-10-01 DIAGNOSIS — I519 Heart disease, unspecified: Secondary | ICD-10-CM | POA: Diagnosis not present

## 2017-10-01 DIAGNOSIS — E11621 Type 2 diabetes mellitus with foot ulcer: Secondary | ICD-10-CM | POA: Diagnosis not present

## 2017-10-01 DIAGNOSIS — E1122 Type 2 diabetes mellitus with diabetic chronic kidney disease: Secondary | ICD-10-CM | POA: Diagnosis not present

## 2017-10-01 DIAGNOSIS — L97522 Non-pressure chronic ulcer of other part of left foot with fat layer exposed: Secondary | ICD-10-CM | POA: Diagnosis not present

## 2017-10-03 DIAGNOSIS — D631 Anemia in chronic kidney disease: Secondary | ICD-10-CM | POA: Diagnosis not present

## 2017-10-03 DIAGNOSIS — N186 End stage renal disease: Secondary | ICD-10-CM | POA: Diagnosis not present

## 2017-10-03 DIAGNOSIS — N2581 Secondary hyperparathyroidism of renal origin: Secondary | ICD-10-CM | POA: Diagnosis not present

## 2017-10-04 DIAGNOSIS — L97522 Non-pressure chronic ulcer of other part of left foot with fat layer exposed: Secondary | ICD-10-CM | POA: Diagnosis not present

## 2017-10-04 DIAGNOSIS — L97529 Non-pressure chronic ulcer of other part of left foot with unspecified severity: Secondary | ICD-10-CM | POA: Diagnosis not present

## 2017-10-04 DIAGNOSIS — E1122 Type 2 diabetes mellitus with diabetic chronic kidney disease: Secondary | ICD-10-CM | POA: Diagnosis not present

## 2017-10-04 DIAGNOSIS — E11621 Type 2 diabetes mellitus with foot ulcer: Secondary | ICD-10-CM | POA: Diagnosis not present

## 2017-10-04 DIAGNOSIS — M86372 Chronic multifocal osteomyelitis, left ankle and foot: Secondary | ICD-10-CM | POA: Diagnosis not present

## 2017-10-05 ENCOUNTER — Ambulatory Visit (INDEPENDENT_AMBULATORY_CARE_PROVIDER_SITE_OTHER): Payer: Medicare Other | Admitting: Infectious Diseases

## 2017-10-05 ENCOUNTER — Encounter: Payer: Self-pay | Admitting: Infectious Diseases

## 2017-10-05 DIAGNOSIS — E1169 Type 2 diabetes mellitus with other specified complication: Secondary | ICD-10-CM

## 2017-10-05 DIAGNOSIS — M86572 Other chronic hematogenous osteomyelitis, left ankle and foot: Secondary | ICD-10-CM | POA: Diagnosis not present

## 2017-10-05 DIAGNOSIS — E1151 Type 2 diabetes mellitus with diabetic peripheral angiopathy without gangrene: Secondary | ICD-10-CM | POA: Diagnosis not present

## 2017-10-05 DIAGNOSIS — B351 Tinea unguium: Secondary | ICD-10-CM

## 2017-10-05 DIAGNOSIS — E11621 Type 2 diabetes mellitus with foot ulcer: Secondary | ICD-10-CM | POA: Diagnosis not present

## 2017-10-05 DIAGNOSIS — Z992 Dependence on renal dialysis: Secondary | ICD-10-CM

## 2017-10-05 DIAGNOSIS — N186 End stage renal disease: Secondary | ICD-10-CM

## 2017-10-05 DIAGNOSIS — D631 Anemia in chronic kidney disease: Secondary | ICD-10-CM | POA: Diagnosis not present

## 2017-10-05 DIAGNOSIS — L97529 Non-pressure chronic ulcer of other part of left foot with unspecified severity: Secondary | ICD-10-CM

## 2017-10-05 DIAGNOSIS — N2581 Secondary hyperparathyroidism of renal origin: Secondary | ICD-10-CM | POA: Diagnosis not present

## 2017-10-05 NOTE — Assessment & Plan Note (Signed)
Needs better control Will f/u with PCP

## 2017-10-05 NOTE — Assessment & Plan Note (Signed)
He continues to improve.  No further anbx at this point.  Continued f/u at [facility].  He will return to clinic prn.

## 2017-10-05 NOTE — Assessment & Plan Note (Signed)
He will f/u with HD Greatly appreciate their partnering with [place].

## 2017-10-05 NOTE — Assessment & Plan Note (Signed)
He will f/u with his podiatry provider.

## 2017-10-05 NOTE — Progress Notes (Signed)
   Subjective:    Patient ID: [name] Sages Sr., male    DOB: January 13, 1951, 66 y.o.   MRN: [id number]  HPI 66 yo M with hx of DM2, ESRD, CAD, and ulcers on R first metatarsal head; L 5th and L 1st MT heads.  He was seen in ID on 05-16-17 with worsening of his R 5th ray and was sent to hospital. His wound Cx was polymicrobial. 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 His BCx were negative.  He was kept on vanco/ceftaz/flagyl for 2 weeks post-op.  He was seen in ID on 6-21 and was watched off anbx.   He was seen at [facility], had VAC, hyperbaric O2.  Last A1C was 9.X%.   He was restarted on his anbx at HD (vanco/ceftaz/flagyl), completed 6 weeks on 08-15-17.   He continues to follow at [facility]- has VAC, getting wound dressed. TIW.  Wound is smaller.  Walking on with surgical shoe, without difficulty.  Currently off anbx.   Review of Systems  Constitutional: Negative for appetite change, chills, fever and unexpected weight change.  Respiratory: Negative for cough and shortness of breath.   Gastrointestinal: Negative for constipation and diarrhea.  Genitourinary: Negative for difficulty urinating.  Skin: Positive for wound.  Neurological: Negative for headaches.  Psychiatric/Behavioral: Negative for sleep disturbance.  no problems with fistula. Has gotten flu shot.  sched for ophtho this year.  Please see HPI. 12 point ROS o/w (-)      Objective:   Physical Exam  Constitutional: He appears well-developed and well-nourished.  HENT:  Mouth/Throat: No oropharyngeal exudate.  Eyes: Pupils are equal, round, and reactive to light. EOM are normal.  Neck: Neck supple.  Cardiovascular: Normal rate, regular rhythm and normal heart sounds.   Pulmonary/Chest: Effort normal and breath sounds normal.  Abdominal: Soft. Bowel sounds are normal. There is no tenderness. There is no rebound.  Musculoskeletal: He exhibits no edema.       Arms:       Feet:  Lymphadenopathy:    He has no cervical adenopathy.            Assessment & Plan:

## 2017-10-06 DIAGNOSIS — E11621 Type 2 diabetes mellitus with foot ulcer: Secondary | ICD-10-CM | POA: Diagnosis not present

## 2017-10-06 DIAGNOSIS — E1122 Type 2 diabetes mellitus with diabetic chronic kidney disease: Secondary | ICD-10-CM | POA: Diagnosis not present

## 2017-10-06 DIAGNOSIS — N186 End stage renal disease: Secondary | ICD-10-CM | POA: Diagnosis not present

## 2017-10-06 DIAGNOSIS — L97522 Non-pressure chronic ulcer of other part of left foot with fat layer exposed: Secondary | ICD-10-CM | POA: Diagnosis not present

## 2017-10-06 DIAGNOSIS — I519 Heart disease, unspecified: Secondary | ICD-10-CM | POA: Diagnosis not present

## 2017-10-07 DIAGNOSIS — D631 Anemia in chronic kidney disease: Secondary | ICD-10-CM | POA: Diagnosis not present

## 2017-10-07 DIAGNOSIS — N2581 Secondary hyperparathyroidism of renal origin: Secondary | ICD-10-CM | POA: Diagnosis not present

## 2017-10-07 DIAGNOSIS — N186 End stage renal disease: Secondary | ICD-10-CM | POA: Diagnosis not present

## 2017-10-08 DIAGNOSIS — E11621 Type 2 diabetes mellitus with foot ulcer: Secondary | ICD-10-CM | POA: Diagnosis not present

## 2017-10-08 DIAGNOSIS — L97522 Non-pressure chronic ulcer of other part of left foot with fat layer exposed: Secondary | ICD-10-CM | POA: Diagnosis not present

## 2017-10-08 DIAGNOSIS — E1122 Type 2 diabetes mellitus with diabetic chronic kidney disease: Secondary | ICD-10-CM | POA: Diagnosis not present

## 2017-10-08 DIAGNOSIS — N186 End stage renal disease: Secondary | ICD-10-CM | POA: Diagnosis not present

## 2017-10-08 DIAGNOSIS — I519 Heart disease, unspecified: Secondary | ICD-10-CM | POA: Diagnosis not present

## 2017-10-10 DIAGNOSIS — N2581 Secondary hyperparathyroidism of renal origin: Secondary | ICD-10-CM | POA: Diagnosis not present

## 2017-10-10 DIAGNOSIS — N186 End stage renal disease: Secondary | ICD-10-CM | POA: Diagnosis not present

## 2017-10-10 DIAGNOSIS — D631 Anemia in chronic kidney disease: Secondary | ICD-10-CM | POA: Diagnosis not present

## 2017-10-11 DIAGNOSIS — M86372 Chronic multifocal osteomyelitis, left ankle and foot: Secondary | ICD-10-CM | POA: Diagnosis not present

## 2017-10-11 DIAGNOSIS — L97529 Non-pressure chronic ulcer of other part of left foot with unspecified severity: Secondary | ICD-10-CM | POA: Diagnosis not present

## 2017-10-11 DIAGNOSIS — L97522 Non-pressure chronic ulcer of other part of left foot with fat layer exposed: Secondary | ICD-10-CM | POA: Diagnosis not present

## 2017-10-11 DIAGNOSIS — E11621 Type 2 diabetes mellitus with foot ulcer: Secondary | ICD-10-CM | POA: Diagnosis not present

## 2017-10-11 DIAGNOSIS — E1122 Type 2 diabetes mellitus with diabetic chronic kidney disease: Secondary | ICD-10-CM | POA: Diagnosis not present

## 2017-10-12 DIAGNOSIS — N186 End stage renal disease: Secondary | ICD-10-CM | POA: Diagnosis not present

## 2017-10-12 DIAGNOSIS — N2581 Secondary hyperparathyroidism of renal origin: Secondary | ICD-10-CM | POA: Diagnosis not present

## 2017-10-12 DIAGNOSIS — D631 Anemia in chronic kidney disease: Secondary | ICD-10-CM | POA: Diagnosis not present

## 2017-10-13 DIAGNOSIS — I519 Heart disease, unspecified: Secondary | ICD-10-CM | POA: Diagnosis not present

## 2017-10-13 DIAGNOSIS — E1122 Type 2 diabetes mellitus with diabetic chronic kidney disease: Secondary | ICD-10-CM | POA: Diagnosis not present

## 2017-10-13 DIAGNOSIS — L97522 Non-pressure chronic ulcer of other part of left foot with fat layer exposed: Secondary | ICD-10-CM | POA: Diagnosis not present

## 2017-10-13 DIAGNOSIS — E11621 Type 2 diabetes mellitus with foot ulcer: Secondary | ICD-10-CM | POA: Diagnosis not present

## 2017-10-13 DIAGNOSIS — N186 End stage renal disease: Secondary | ICD-10-CM | POA: Diagnosis not present

## 2017-10-14 DIAGNOSIS — N2581 Secondary hyperparathyroidism of renal origin: Secondary | ICD-10-CM | POA: Diagnosis not present

## 2017-10-14 DIAGNOSIS — N186 End stage renal disease: Secondary | ICD-10-CM | POA: Diagnosis not present

## 2017-10-14 DIAGNOSIS — D631 Anemia in chronic kidney disease: Secondary | ICD-10-CM | POA: Diagnosis not present

## 2017-10-15 DIAGNOSIS — L97522 Non-pressure chronic ulcer of other part of left foot with fat layer exposed: Secondary | ICD-10-CM | POA: Diagnosis not present

## 2017-10-15 DIAGNOSIS — I519 Heart disease, unspecified: Secondary | ICD-10-CM | POA: Diagnosis not present

## 2017-10-15 DIAGNOSIS — E1122 Type 2 diabetes mellitus with diabetic chronic kidney disease: Secondary | ICD-10-CM | POA: Diagnosis not present

## 2017-10-15 DIAGNOSIS — N186 End stage renal disease: Secondary | ICD-10-CM | POA: Diagnosis not present

## 2017-10-15 DIAGNOSIS — E11621 Type 2 diabetes mellitus with foot ulcer: Secondary | ICD-10-CM | POA: Diagnosis not present

## 2017-10-17 DIAGNOSIS — D631 Anemia in chronic kidney disease: Secondary | ICD-10-CM | POA: Diagnosis not present

## 2017-10-17 DIAGNOSIS — N186 End stage renal disease: Secondary | ICD-10-CM | POA: Diagnosis not present

## 2017-10-17 DIAGNOSIS — N2581 Secondary hyperparathyroidism of renal origin: Secondary | ICD-10-CM | POA: Diagnosis not present

## 2017-10-18 DIAGNOSIS — E1122 Type 2 diabetes mellitus with diabetic chronic kidney disease: Secondary | ICD-10-CM | POA: Diagnosis not present

## 2017-10-18 DIAGNOSIS — M86372 Chronic multifocal osteomyelitis, left ankle and foot: Secondary | ICD-10-CM | POA: Diagnosis not present

## 2017-10-18 DIAGNOSIS — L97529 Non-pressure chronic ulcer of other part of left foot with unspecified severity: Secondary | ICD-10-CM | POA: Diagnosis not present

## 2017-10-18 DIAGNOSIS — L97522 Non-pressure chronic ulcer of other part of left foot with fat layer exposed: Secondary | ICD-10-CM | POA: Diagnosis not present

## 2017-10-18 DIAGNOSIS — E11621 Type 2 diabetes mellitus with foot ulcer: Secondary | ICD-10-CM | POA: Diagnosis not present

## 2017-10-19 DIAGNOSIS — N2581 Secondary hyperparathyroidism of renal origin: Secondary | ICD-10-CM | POA: Diagnosis not present

## 2017-10-19 DIAGNOSIS — D631 Anemia in chronic kidney disease: Secondary | ICD-10-CM | POA: Diagnosis not present

## 2017-10-19 DIAGNOSIS — N186 End stage renal disease: Secondary | ICD-10-CM | POA: Diagnosis not present

## 2017-10-19 DIAGNOSIS — E1121 Type 2 diabetes mellitus with diabetic nephropathy: Secondary | ICD-10-CM | POA: Diagnosis not present

## 2017-10-20 DIAGNOSIS — I519 Heart disease, unspecified: Secondary | ICD-10-CM | POA: Diagnosis not present

## 2017-10-20 DIAGNOSIS — L97522 Non-pressure chronic ulcer of other part of left foot with fat layer exposed: Secondary | ICD-10-CM | POA: Diagnosis not present

## 2017-10-20 DIAGNOSIS — N186 End stage renal disease: Secondary | ICD-10-CM | POA: Diagnosis not present

## 2017-10-20 DIAGNOSIS — E1122 Type 2 diabetes mellitus with diabetic chronic kidney disease: Secondary | ICD-10-CM | POA: Diagnosis not present

## 2017-10-20 DIAGNOSIS — E11621 Type 2 diabetes mellitus with foot ulcer: Secondary | ICD-10-CM | POA: Diagnosis not present

## 2017-10-21 DIAGNOSIS — D631 Anemia in chronic kidney disease: Secondary | ICD-10-CM | POA: Diagnosis not present

## 2017-10-21 DIAGNOSIS — N2581 Secondary hyperparathyroidism of renal origin: Secondary | ICD-10-CM | POA: Diagnosis not present

## 2017-10-21 DIAGNOSIS — N186 End stage renal disease: Secondary | ICD-10-CM | POA: Diagnosis not present

## 2017-10-24 DIAGNOSIS — N186 End stage renal disease: Secondary | ICD-10-CM | POA: Diagnosis not present

## 2017-10-24 DIAGNOSIS — N2581 Secondary hyperparathyroidism of renal origin: Secondary | ICD-10-CM | POA: Diagnosis not present

## 2017-10-24 DIAGNOSIS — D631 Anemia in chronic kidney disease: Secondary | ICD-10-CM | POA: Diagnosis not present

## 2017-10-25 DIAGNOSIS — L97529 Non-pressure chronic ulcer of other part of left foot with unspecified severity: Secondary | ICD-10-CM | POA: Diagnosis not present

## 2017-10-25 DIAGNOSIS — E1122 Type 2 diabetes mellitus with diabetic chronic kidney disease: Secondary | ICD-10-CM | POA: Diagnosis not present

## 2017-10-25 DIAGNOSIS — E11628 Type 2 diabetes mellitus with other skin complications: Secondary | ICD-10-CM | POA: Diagnosis not present

## 2017-10-25 DIAGNOSIS — M86372 Chronic multifocal osteomyelitis, left ankle and foot: Secondary | ICD-10-CM | POA: Diagnosis not present

## 2017-10-25 DIAGNOSIS — L97522 Non-pressure chronic ulcer of other part of left foot with fat layer exposed: Secondary | ICD-10-CM | POA: Diagnosis not present

## 2017-10-25 DIAGNOSIS — E11621 Type 2 diabetes mellitus with foot ulcer: Secondary | ICD-10-CM | POA: Diagnosis not present

## 2017-10-26 DIAGNOSIS — Z992 Dependence on renal dialysis: Secondary | ICD-10-CM | POA: Diagnosis not present

## 2017-10-26 DIAGNOSIS — E1122 Type 2 diabetes mellitus with diabetic chronic kidney disease: Secondary | ICD-10-CM | POA: Diagnosis not present

## 2017-10-26 DIAGNOSIS — N2581 Secondary hyperparathyroidism of renal origin: Secondary | ICD-10-CM | POA: Diagnosis not present

## 2017-10-26 DIAGNOSIS — N186 End stage renal disease: Secondary | ICD-10-CM | POA: Diagnosis not present

## 2017-10-26 DIAGNOSIS — D631 Anemia in chronic kidney disease: Secondary | ICD-10-CM | POA: Diagnosis not present

## 2017-10-28 DIAGNOSIS — N2581 Secondary hyperparathyroidism of renal origin: Secondary | ICD-10-CM | POA: Diagnosis not present

## 2017-10-28 DIAGNOSIS — N186 End stage renal disease: Secondary | ICD-10-CM | POA: Diagnosis not present

## 2017-10-28 DIAGNOSIS — E1121 Type 2 diabetes mellitus with diabetic nephropathy: Secondary | ICD-10-CM | POA: Diagnosis not present

## 2017-10-31 DIAGNOSIS — E1121 Type 2 diabetes mellitus with diabetic nephropathy: Secondary | ICD-10-CM | POA: Diagnosis not present

## 2017-10-31 DIAGNOSIS — N2581 Secondary hyperparathyroidism of renal origin: Secondary | ICD-10-CM | POA: Diagnosis not present

## 2017-10-31 DIAGNOSIS — N186 End stage renal disease: Secondary | ICD-10-CM | POA: Diagnosis not present

## 2017-11-01 ENCOUNTER — Encounter (HOSPITAL_BASED_OUTPATIENT_CLINIC_OR_DEPARTMENT_OTHER): Payer: Medicare Other | Attending: Surgery

## 2017-11-01 DIAGNOSIS — I120 Hypertensive chronic kidney disease with stage 5 chronic kidney disease or end stage renal disease: Secondary | ICD-10-CM | POA: Insufficient documentation

## 2017-11-01 DIAGNOSIS — L97522 Non-pressure chronic ulcer of other part of left foot with fat layer exposed: Secondary | ICD-10-CM | POA: Diagnosis not present

## 2017-11-01 DIAGNOSIS — E11621 Type 2 diabetes mellitus with foot ulcer: Secondary | ICD-10-CM | POA: Diagnosis not present

## 2017-11-01 DIAGNOSIS — Z992 Dependence on renal dialysis: Secondary | ICD-10-CM | POA: Diagnosis not present

## 2017-11-01 DIAGNOSIS — E1140 Type 2 diabetes mellitus with diabetic neuropathy, unspecified: Secondary | ICD-10-CM | POA: Insufficient documentation

## 2017-11-01 DIAGNOSIS — N186 End stage renal disease: Secondary | ICD-10-CM | POA: Insufficient documentation

## 2017-11-01 DIAGNOSIS — E1122 Type 2 diabetes mellitus with diabetic chronic kidney disease: Secondary | ICD-10-CM | POA: Diagnosis not present

## 2017-11-01 DIAGNOSIS — E119 Type 2 diabetes mellitus without complications: Secondary | ICD-10-CM | POA: Diagnosis not present

## 2017-11-02 DIAGNOSIS — E1121 Type 2 diabetes mellitus with diabetic nephropathy: Secondary | ICD-10-CM | POA: Diagnosis not present

## 2017-11-02 DIAGNOSIS — N186 End stage renal disease: Secondary | ICD-10-CM | POA: Diagnosis not present

## 2017-11-02 DIAGNOSIS — N2581 Secondary hyperparathyroidism of renal origin: Secondary | ICD-10-CM | POA: Diagnosis not present

## 2017-11-03 DIAGNOSIS — L600 Ingrowing nail: Secondary | ICD-10-CM | POA: Diagnosis not present

## 2017-11-04 DIAGNOSIS — E1121 Type 2 diabetes mellitus with diabetic nephropathy: Secondary | ICD-10-CM | POA: Diagnosis not present

## 2017-11-04 DIAGNOSIS — N2581 Secondary hyperparathyroidism of renal origin: Secondary | ICD-10-CM | POA: Diagnosis not present

## 2017-11-04 DIAGNOSIS — N186 End stage renal disease: Secondary | ICD-10-CM | POA: Diagnosis not present

## 2017-11-07 DIAGNOSIS — N186 End stage renal disease: Secondary | ICD-10-CM | POA: Diagnosis not present

## 2017-11-07 DIAGNOSIS — N2581 Secondary hyperparathyroidism of renal origin: Secondary | ICD-10-CM | POA: Diagnosis not present

## 2017-11-07 DIAGNOSIS — E1121 Type 2 diabetes mellitus with diabetic nephropathy: Secondary | ICD-10-CM | POA: Diagnosis not present

## 2017-11-08 DIAGNOSIS — I120 Hypertensive chronic kidney disease with stage 5 chronic kidney disease or end stage renal disease: Secondary | ICD-10-CM | POA: Diagnosis not present

## 2017-11-08 DIAGNOSIS — N186 End stage renal disease: Secondary | ICD-10-CM | POA: Diagnosis not present

## 2017-11-08 DIAGNOSIS — L97522 Non-pressure chronic ulcer of other part of left foot with fat layer exposed: Secondary | ICD-10-CM | POA: Diagnosis not present

## 2017-11-08 DIAGNOSIS — E11621 Type 2 diabetes mellitus with foot ulcer: Secondary | ICD-10-CM | POA: Diagnosis not present

## 2017-11-08 DIAGNOSIS — E1122 Type 2 diabetes mellitus with diabetic chronic kidney disease: Secondary | ICD-10-CM | POA: Diagnosis not present

## 2017-11-08 DIAGNOSIS — Z992 Dependence on renal dialysis: Secondary | ICD-10-CM | POA: Diagnosis not present

## 2017-11-09 DIAGNOSIS — N186 End stage renal disease: Secondary | ICD-10-CM | POA: Diagnosis not present

## 2017-11-09 DIAGNOSIS — E1121 Type 2 diabetes mellitus with diabetic nephropathy: Secondary | ICD-10-CM | POA: Diagnosis not present

## 2017-11-09 DIAGNOSIS — N2581 Secondary hyperparathyroidism of renal origin: Secondary | ICD-10-CM | POA: Diagnosis not present

## 2017-11-11 DIAGNOSIS — N186 End stage renal disease: Secondary | ICD-10-CM | POA: Diagnosis not present

## 2017-11-11 DIAGNOSIS — N2581 Secondary hyperparathyroidism of renal origin: Secondary | ICD-10-CM | POA: Diagnosis not present

## 2017-11-11 DIAGNOSIS — E1121 Type 2 diabetes mellitus with diabetic nephropathy: Secondary | ICD-10-CM | POA: Diagnosis not present

## 2017-11-13 DIAGNOSIS — N186 End stage renal disease: Secondary | ICD-10-CM | POA: Diagnosis not present

## 2017-11-13 DIAGNOSIS — N2581 Secondary hyperparathyroidism of renal origin: Secondary | ICD-10-CM | POA: Diagnosis not present

## 2017-11-13 DIAGNOSIS — E1121 Type 2 diabetes mellitus with diabetic nephropathy: Secondary | ICD-10-CM | POA: Diagnosis not present

## 2017-11-15 DIAGNOSIS — N289 Disorder of kidney and ureter, unspecified: Secondary | ICD-10-CM | POA: Diagnosis not present

## 2017-11-15 DIAGNOSIS — N2581 Secondary hyperparathyroidism of renal origin: Secondary | ICD-10-CM | POA: Diagnosis not present

## 2017-11-15 DIAGNOSIS — E119 Type 2 diabetes mellitus without complications: Secondary | ICD-10-CM | POA: Diagnosis not present

## 2017-11-15 DIAGNOSIS — R251 Tremor, unspecified: Secondary | ICD-10-CM | POA: Diagnosis not present

## 2017-11-15 DIAGNOSIS — E1121 Type 2 diabetes mellitus with diabetic nephropathy: Secondary | ICD-10-CM | POA: Diagnosis not present

## 2017-11-15 DIAGNOSIS — I10 Essential (primary) hypertension: Secondary | ICD-10-CM | POA: Diagnosis not present

## 2017-11-15 DIAGNOSIS — N186 End stage renal disease: Secondary | ICD-10-CM | POA: Diagnosis not present

## 2017-11-16 DIAGNOSIS — N186 End stage renal disease: Secondary | ICD-10-CM | POA: Diagnosis not present

## 2017-11-16 DIAGNOSIS — E11621 Type 2 diabetes mellitus with foot ulcer: Secondary | ICD-10-CM | POA: Diagnosis not present

## 2017-11-16 DIAGNOSIS — Z992 Dependence on renal dialysis: Secondary | ICD-10-CM | POA: Diagnosis not present

## 2017-11-16 DIAGNOSIS — L97522 Non-pressure chronic ulcer of other part of left foot with fat layer exposed: Secondary | ICD-10-CM | POA: Diagnosis not present

## 2017-11-16 DIAGNOSIS — E1122 Type 2 diabetes mellitus with diabetic chronic kidney disease: Secondary | ICD-10-CM | POA: Diagnosis not present

## 2017-11-16 DIAGNOSIS — I120 Hypertensive chronic kidney disease with stage 5 chronic kidney disease or end stage renal disease: Secondary | ICD-10-CM | POA: Diagnosis not present

## 2017-11-18 DIAGNOSIS — N186 End stage renal disease: Secondary | ICD-10-CM | POA: Diagnosis not present

## 2017-11-18 DIAGNOSIS — N2581 Secondary hyperparathyroidism of renal origin: Secondary | ICD-10-CM | POA: Diagnosis not present

## 2017-11-18 DIAGNOSIS — E1121 Type 2 diabetes mellitus with diabetic nephropathy: Secondary | ICD-10-CM | POA: Diagnosis not present

## 2017-11-21 DIAGNOSIS — N2581 Secondary hyperparathyroidism of renal origin: Secondary | ICD-10-CM | POA: Diagnosis not present

## 2017-11-21 DIAGNOSIS — N186 End stage renal disease: Secondary | ICD-10-CM | POA: Diagnosis not present

## 2017-11-21 DIAGNOSIS — E1121 Type 2 diabetes mellitus with diabetic nephropathy: Secondary | ICD-10-CM | POA: Diagnosis not present

## 2017-11-22 DIAGNOSIS — L97522 Non-pressure chronic ulcer of other part of left foot with fat layer exposed: Secondary | ICD-10-CM | POA: Diagnosis not present

## 2017-11-22 DIAGNOSIS — N186 End stage renal disease: Secondary | ICD-10-CM | POA: Diagnosis not present

## 2017-11-22 DIAGNOSIS — I120 Hypertensive chronic kidney disease with stage 5 chronic kidney disease or end stage renal disease: Secondary | ICD-10-CM | POA: Diagnosis not present

## 2017-11-22 DIAGNOSIS — E11621 Type 2 diabetes mellitus with foot ulcer: Secondary | ICD-10-CM | POA: Diagnosis not present

## 2017-11-22 DIAGNOSIS — Z992 Dependence on renal dialysis: Secondary | ICD-10-CM | POA: Diagnosis not present

## 2017-11-22 DIAGNOSIS — E1122 Type 2 diabetes mellitus with diabetic chronic kidney disease: Secondary | ICD-10-CM | POA: Diagnosis not present

## 2017-11-23 DIAGNOSIS — N2581 Secondary hyperparathyroidism of renal origin: Secondary | ICD-10-CM | POA: Diagnosis not present

## 2017-11-23 DIAGNOSIS — N186 End stage renal disease: Secondary | ICD-10-CM | POA: Diagnosis not present

## 2017-11-23 DIAGNOSIS — E1121 Type 2 diabetes mellitus with diabetic nephropathy: Secondary | ICD-10-CM | POA: Diagnosis not present

## 2017-11-25 DIAGNOSIS — E1122 Type 2 diabetes mellitus with diabetic chronic kidney disease: Secondary | ICD-10-CM | POA: Diagnosis not present

## 2017-11-25 DIAGNOSIS — N2581 Secondary hyperparathyroidism of renal origin: Secondary | ICD-10-CM | POA: Diagnosis not present

## 2017-11-25 DIAGNOSIS — E1121 Type 2 diabetes mellitus with diabetic nephropathy: Secondary | ICD-10-CM | POA: Diagnosis not present

## 2017-11-25 DIAGNOSIS — N186 End stage renal disease: Secondary | ICD-10-CM | POA: Diagnosis not present

## 2017-11-25 DIAGNOSIS — Z992 Dependence on renal dialysis: Secondary | ICD-10-CM | POA: Diagnosis not present

## 2017-11-28 DIAGNOSIS — N2581 Secondary hyperparathyroidism of renal origin: Secondary | ICD-10-CM | POA: Diagnosis not present

## 2017-11-28 DIAGNOSIS — N186 End stage renal disease: Secondary | ICD-10-CM | POA: Diagnosis not present

## 2017-11-28 DIAGNOSIS — E1121 Type 2 diabetes mellitus with diabetic nephropathy: Secondary | ICD-10-CM | POA: Diagnosis not present

## 2017-11-29 ENCOUNTER — Encounter (HOSPITAL_BASED_OUTPATIENT_CLINIC_OR_DEPARTMENT_OTHER): Payer: Medicare Other | Attending: Surgery

## 2017-11-29 DIAGNOSIS — E11621 Type 2 diabetes mellitus with foot ulcer: Secondary | ICD-10-CM | POA: Diagnosis not present

## 2017-11-29 DIAGNOSIS — L97522 Non-pressure chronic ulcer of other part of left foot with fat layer exposed: Secondary | ICD-10-CM | POA: Insufficient documentation

## 2017-11-29 DIAGNOSIS — Z992 Dependence on renal dialysis: Secondary | ICD-10-CM | POA: Insufficient documentation

## 2017-11-29 DIAGNOSIS — E1122 Type 2 diabetes mellitus with diabetic chronic kidney disease: Secondary | ICD-10-CM | POA: Diagnosis not present

## 2017-11-29 DIAGNOSIS — I132 Hypertensive heart and chronic kidney disease with heart failure and with stage 5 chronic kidney disease, or end stage renal disease: Secondary | ICD-10-CM | POA: Insufficient documentation

## 2017-11-29 DIAGNOSIS — M86372 Chronic multifocal osteomyelitis, left ankle and foot: Secondary | ICD-10-CM | POA: Diagnosis not present

## 2017-11-29 DIAGNOSIS — N186 End stage renal disease: Secondary | ICD-10-CM | POA: Insufficient documentation

## 2017-11-29 DIAGNOSIS — I509 Heart failure, unspecified: Secondary | ICD-10-CM | POA: Insufficient documentation

## 2017-11-29 DIAGNOSIS — E1140 Type 2 diabetes mellitus with diabetic neuropathy, unspecified: Secondary | ICD-10-CM | POA: Insufficient documentation

## 2017-11-29 DIAGNOSIS — L97512 Non-pressure chronic ulcer of other part of right foot with fat layer exposed: Secondary | ICD-10-CM | POA: Diagnosis not present

## 2017-11-29 DIAGNOSIS — E1151 Type 2 diabetes mellitus with diabetic peripheral angiopathy without gangrene: Secondary | ICD-10-CM | POA: Diagnosis not present

## 2017-11-30 DIAGNOSIS — N186 End stage renal disease: Secondary | ICD-10-CM | POA: Diagnosis not present

## 2017-11-30 DIAGNOSIS — E1121 Type 2 diabetes mellitus with diabetic nephropathy: Secondary | ICD-10-CM | POA: Diagnosis not present

## 2017-11-30 DIAGNOSIS — N2581 Secondary hyperparathyroidism of renal origin: Secondary | ICD-10-CM | POA: Diagnosis not present

## 2017-12-02 DIAGNOSIS — E1121 Type 2 diabetes mellitus with diabetic nephropathy: Secondary | ICD-10-CM | POA: Diagnosis not present

## 2017-12-02 DIAGNOSIS — N2581 Secondary hyperparathyroidism of renal origin: Secondary | ICD-10-CM | POA: Diagnosis not present

## 2017-12-02 DIAGNOSIS — N186 End stage renal disease: Secondary | ICD-10-CM | POA: Diagnosis not present

## 2017-12-05 DIAGNOSIS — N186 End stage renal disease: Secondary | ICD-10-CM | POA: Diagnosis not present

## 2017-12-05 DIAGNOSIS — E1121 Type 2 diabetes mellitus with diabetic nephropathy: Secondary | ICD-10-CM | POA: Diagnosis not present

## 2017-12-05 DIAGNOSIS — N2581 Secondary hyperparathyroidism of renal origin: Secondary | ICD-10-CM | POA: Diagnosis not present

## 2017-12-06 DIAGNOSIS — L97512 Non-pressure chronic ulcer of other part of right foot with fat layer exposed: Secondary | ICD-10-CM | POA: Diagnosis not present

## 2017-12-06 DIAGNOSIS — M86372 Chronic multifocal osteomyelitis, left ankle and foot: Secondary | ICD-10-CM | POA: Diagnosis not present

## 2017-12-06 DIAGNOSIS — L97522 Non-pressure chronic ulcer of other part of left foot with fat layer exposed: Secondary | ICD-10-CM | POA: Diagnosis not present

## 2017-12-06 DIAGNOSIS — E11621 Type 2 diabetes mellitus with foot ulcer: Secondary | ICD-10-CM | POA: Diagnosis not present

## 2017-12-06 DIAGNOSIS — I132 Hypertensive heart and chronic kidney disease with heart failure and with stage 5 chronic kidney disease, or end stage renal disease: Secondary | ICD-10-CM | POA: Diagnosis not present

## 2017-12-07 DIAGNOSIS — E1121 Type 2 diabetes mellitus with diabetic nephropathy: Secondary | ICD-10-CM | POA: Diagnosis not present

## 2017-12-07 DIAGNOSIS — N186 End stage renal disease: Secondary | ICD-10-CM | POA: Diagnosis not present

## 2017-12-07 DIAGNOSIS — N2581 Secondary hyperparathyroidism of renal origin: Secondary | ICD-10-CM | POA: Diagnosis not present

## 2017-12-09 DIAGNOSIS — N2581 Secondary hyperparathyroidism of renal origin: Secondary | ICD-10-CM | POA: Diagnosis not present

## 2017-12-09 DIAGNOSIS — N186 End stage renal disease: Secondary | ICD-10-CM | POA: Diagnosis not present

## 2017-12-09 DIAGNOSIS — E1121 Type 2 diabetes mellitus with diabetic nephropathy: Secondary | ICD-10-CM | POA: Diagnosis not present

## 2017-12-12 DIAGNOSIS — N186 End stage renal disease: Secondary | ICD-10-CM | POA: Diagnosis not present

## 2017-12-12 DIAGNOSIS — E1121 Type 2 diabetes mellitus with diabetic nephropathy: Secondary | ICD-10-CM | POA: Diagnosis not present

## 2017-12-12 DIAGNOSIS — N2581 Secondary hyperparathyroidism of renal origin: Secondary | ICD-10-CM | POA: Diagnosis not present

## 2017-12-13 DIAGNOSIS — I132 Hypertensive heart and chronic kidney disease with heart failure and with stage 5 chronic kidney disease, or end stage renal disease: Secondary | ICD-10-CM | POA: Diagnosis not present

## 2017-12-13 DIAGNOSIS — L97512 Non-pressure chronic ulcer of other part of right foot with fat layer exposed: Secondary | ICD-10-CM | POA: Diagnosis not present

## 2017-12-13 DIAGNOSIS — E11621 Type 2 diabetes mellitus with foot ulcer: Secondary | ICD-10-CM | POA: Diagnosis not present

## 2017-12-13 DIAGNOSIS — M86372 Chronic multifocal osteomyelitis, left ankle and foot: Secondary | ICD-10-CM | POA: Diagnosis not present

## 2017-12-13 DIAGNOSIS — L97522 Non-pressure chronic ulcer of other part of left foot with fat layer exposed: Secondary | ICD-10-CM | POA: Diagnosis not present

## 2017-12-14 DIAGNOSIS — E1121 Type 2 diabetes mellitus with diabetic nephropathy: Secondary | ICD-10-CM | POA: Diagnosis not present

## 2017-12-14 DIAGNOSIS — N2581 Secondary hyperparathyroidism of renal origin: Secondary | ICD-10-CM | POA: Diagnosis not present

## 2017-12-14 DIAGNOSIS — N186 End stage renal disease: Secondary | ICD-10-CM | POA: Diagnosis not present

## 2017-12-16 DIAGNOSIS — N2581 Secondary hyperparathyroidism of renal origin: Secondary | ICD-10-CM | POA: Diagnosis not present

## 2017-12-16 DIAGNOSIS — N186 End stage renal disease: Secondary | ICD-10-CM | POA: Diagnosis not present

## 2017-12-16 DIAGNOSIS — E1121 Type 2 diabetes mellitus with diabetic nephropathy: Secondary | ICD-10-CM | POA: Diagnosis not present

## 2017-12-18 DIAGNOSIS — E1121 Type 2 diabetes mellitus with diabetic nephropathy: Secondary | ICD-10-CM | POA: Diagnosis not present

## 2017-12-18 DIAGNOSIS — N2581 Secondary hyperparathyroidism of renal origin: Secondary | ICD-10-CM | POA: Diagnosis not present

## 2017-12-18 DIAGNOSIS — N186 End stage renal disease: Secondary | ICD-10-CM | POA: Diagnosis not present

## 2017-12-21 DIAGNOSIS — N186 End stage renal disease: Secondary | ICD-10-CM | POA: Diagnosis not present

## 2017-12-21 DIAGNOSIS — I132 Hypertensive heart and chronic kidney disease with heart failure and with stage 5 chronic kidney disease, or end stage renal disease: Secondary | ICD-10-CM | POA: Diagnosis not present

## 2017-12-21 DIAGNOSIS — L97512 Non-pressure chronic ulcer of other part of right foot with fat layer exposed: Secondary | ICD-10-CM | POA: Diagnosis not present

## 2017-12-21 DIAGNOSIS — E11621 Type 2 diabetes mellitus with foot ulcer: Secondary | ICD-10-CM | POA: Diagnosis not present

## 2017-12-21 DIAGNOSIS — M86372 Chronic multifocal osteomyelitis, left ankle and foot: Secondary | ICD-10-CM | POA: Diagnosis not present

## 2017-12-21 DIAGNOSIS — N2581 Secondary hyperparathyroidism of renal origin: Secondary | ICD-10-CM | POA: Diagnosis not present

## 2017-12-21 DIAGNOSIS — E1121 Type 2 diabetes mellitus with diabetic nephropathy: Secondary | ICD-10-CM | POA: Diagnosis not present

## 2017-12-21 DIAGNOSIS — L97522 Non-pressure chronic ulcer of other part of left foot with fat layer exposed: Secondary | ICD-10-CM | POA: Diagnosis not present

## 2017-12-25 DIAGNOSIS — N2581 Secondary hyperparathyroidism of renal origin: Secondary | ICD-10-CM | POA: Diagnosis not present

## 2017-12-25 DIAGNOSIS — N186 End stage renal disease: Secondary | ICD-10-CM | POA: Diagnosis not present

## 2017-12-25 DIAGNOSIS — E1121 Type 2 diabetes mellitus with diabetic nephropathy: Secondary | ICD-10-CM | POA: Diagnosis not present

## 2017-12-26 DIAGNOSIS — N186 End stage renal disease: Secondary | ICD-10-CM | POA: Diagnosis not present

## 2017-12-26 DIAGNOSIS — Z992 Dependence on renal dialysis: Secondary | ICD-10-CM | POA: Diagnosis not present

## 2017-12-26 DIAGNOSIS — E1122 Type 2 diabetes mellitus with diabetic chronic kidney disease: Secondary | ICD-10-CM | POA: Diagnosis not present

## 2017-12-28 ENCOUNTER — Other Ambulatory Visit: Payer: Self-pay | Admitting: Physician Assistant

## 2017-12-28 ENCOUNTER — Other Ambulatory Visit (HOSPITAL_COMMUNITY)
Admit: 2017-12-28 | Discharge: 2017-12-28 | Disposition: A | Discharge status: Home / Self Care | Payer: Medicare Other | Source: Ambulatory Visit | Attending: Physician Assistant | Admitting: Physician Assistant

## 2017-12-28 ENCOUNTER — Encounter (HOSPITAL_BASED_OUTPATIENT_CLINIC_OR_DEPARTMENT_OTHER): Payer: Medicare Other | Attending: Physician Assistant

## 2017-12-28 DIAGNOSIS — N2581 Secondary hyperparathyroidism of renal origin: Secondary | ICD-10-CM | POA: Diagnosis not present

## 2017-12-28 DIAGNOSIS — L97522 Non-pressure chronic ulcer of other part of left foot with fat layer exposed: Secondary | ICD-10-CM | POA: Diagnosis not present

## 2017-12-28 DIAGNOSIS — I509 Heart failure, unspecified: Secondary | ICD-10-CM | POA: Diagnosis not present

## 2017-12-28 DIAGNOSIS — E1122 Type 2 diabetes mellitus with diabetic chronic kidney disease: Secondary | ICD-10-CM | POA: Insufficient documentation

## 2017-12-28 DIAGNOSIS — Z992 Dependence on renal dialysis: Secondary | ICD-10-CM | POA: Diagnosis not present

## 2017-12-28 DIAGNOSIS — E11621 Type 2 diabetes mellitus with foot ulcer: Secondary | ICD-10-CM | POA: Insufficient documentation

## 2017-12-28 DIAGNOSIS — L97524 Non-pressure chronic ulcer of other part of left foot with necrosis of bone: Secondary | ICD-10-CM | POA: Insufficient documentation

## 2017-12-28 DIAGNOSIS — L97526 Non-pressure chronic ulcer of other part of left foot with bone involvement without evidence of necrosis: Secondary | ICD-10-CM | POA: Diagnosis not present

## 2017-12-28 DIAGNOSIS — E1140 Type 2 diabetes mellitus with diabetic neuropathy, unspecified: Secondary | ICD-10-CM | POA: Insufficient documentation

## 2017-12-28 DIAGNOSIS — M869 Osteomyelitis, unspecified: Secondary | ICD-10-CM | POA: Diagnosis not present

## 2017-12-28 DIAGNOSIS — E1121 Type 2 diabetes mellitus with diabetic nephropathy: Secondary | ICD-10-CM | POA: Diagnosis not present

## 2017-12-28 DIAGNOSIS — L97509 Non-pressure chronic ulcer of other part of unspecified foot with unspecified severity: Principal | ICD-10-CM

## 2017-12-28 DIAGNOSIS — N186 End stage renal disease: Secondary | ICD-10-CM | POA: Diagnosis not present

## 2017-12-28 DIAGNOSIS — I132 Hypertensive heart and chronic kidney disease with heart failure and with stage 5 chronic kidney disease, or end stage renal disease: Secondary | ICD-10-CM | POA: Insufficient documentation

## 2017-12-28 DIAGNOSIS — L97512 Non-pressure chronic ulcer of other part of right foot with fat layer exposed: Secondary | ICD-10-CM | POA: Diagnosis not present

## 2017-12-28 DIAGNOSIS — E1169 Type 2 diabetes mellitus with other specified complication: Principal | ICD-10-CM

## 2017-12-28 DIAGNOSIS — M868X7 Other osteomyelitis, ankle and foot: Secondary | ICD-10-CM | POA: Diagnosis not present

## 2017-12-30 DIAGNOSIS — E1121 Type 2 diabetes mellitus with diabetic nephropathy: Secondary | ICD-10-CM | POA: Diagnosis not present

## 2017-12-30 DIAGNOSIS — M869 Osteomyelitis, unspecified: Secondary | ICD-10-CM | POA: Diagnosis not present

## 2017-12-30 DIAGNOSIS — N2581 Secondary hyperparathyroidism of renal origin: Secondary | ICD-10-CM | POA: Diagnosis not present

## 2017-12-30 DIAGNOSIS — M868X7 Other osteomyelitis, ankle and foot: Secondary | ICD-10-CM | POA: Diagnosis not present

## 2017-12-30 DIAGNOSIS — N186 End stage renal disease: Secondary | ICD-10-CM | POA: Diagnosis not present

## 2017-12-31 LAB — AEROBIC CULTURE  (SUPERFICIAL SPECIMEN)

## 2017-12-31 LAB — AEROBIC CULTURE W GRAM STAIN (SUPERFICIAL SPECIMEN)

## 2018-01-02 DIAGNOSIS — N186 End stage renal disease: Secondary | ICD-10-CM | POA: Diagnosis not present

## 2018-01-02 DIAGNOSIS — M869 Osteomyelitis, unspecified: Secondary | ICD-10-CM | POA: Diagnosis not present

## 2018-01-02 DIAGNOSIS — M868X7 Other osteomyelitis, ankle and foot: Secondary | ICD-10-CM | POA: Diagnosis not present

## 2018-01-02 DIAGNOSIS — N2581 Secondary hyperparathyroidism of renal origin: Secondary | ICD-10-CM | POA: Diagnosis not present

## 2018-01-02 DIAGNOSIS — E1121 Type 2 diabetes mellitus with diabetic nephropathy: Secondary | ICD-10-CM | POA: Diagnosis not present

## 2018-01-03 ENCOUNTER — Encounter (HOSPITAL_BASED_OUTPATIENT_CLINIC_OR_DEPARTMENT_OTHER): Payer: Medicare Other

## 2018-01-03 DIAGNOSIS — B9561 Methicillin susceptible Staphylococcus aureus infection as the cause of diseases classified elsewhere: Secondary | ICD-10-CM | POA: Diagnosis not present

## 2018-01-03 DIAGNOSIS — E11621 Type 2 diabetes mellitus with foot ulcer: Secondary | ICD-10-CM | POA: Diagnosis not present

## 2018-01-03 DIAGNOSIS — L97524 Non-pressure chronic ulcer of other part of left foot with necrosis of bone: Secondary | ICD-10-CM | POA: Diagnosis not present

## 2018-01-03 DIAGNOSIS — L97526 Non-pressure chronic ulcer of other part of left foot with bone involvement without evidence of necrosis: Secondary | ICD-10-CM | POA: Diagnosis not present

## 2018-01-03 DIAGNOSIS — L97512 Non-pressure chronic ulcer of other part of right foot with fat layer exposed: Secondary | ICD-10-CM | POA: Diagnosis not present

## 2018-01-03 DIAGNOSIS — I132 Hypertensive heart and chronic kidney disease with heart failure and with stage 5 chronic kidney disease, or end stage renal disease: Secondary | ICD-10-CM | POA: Diagnosis not present

## 2018-01-03 DIAGNOSIS — B951 Streptococcus, group B, as the cause of diseases classified elsewhere: Secondary | ICD-10-CM | POA: Diagnosis not present

## 2018-01-03 DIAGNOSIS — L97522 Non-pressure chronic ulcer of other part of left foot with fat layer exposed: Secondary | ICD-10-CM | POA: Diagnosis not present

## 2018-01-04 ENCOUNTER — Ambulatory Visit (HOSPITAL_COMMUNITY)
Admission: RE | Admit: 2018-01-04 | Discharge: 2018-01-04 | Disposition: A | Discharge status: Home / Self Care | Payer: Medicare Other | Source: Ambulatory Visit | Attending: Physician Assistant | Admitting: Physician Assistant

## 2018-01-04 DIAGNOSIS — M869 Osteomyelitis, unspecified: Secondary | ICD-10-CM | POA: Diagnosis not present

## 2018-01-04 DIAGNOSIS — M9272 Juvenile osteochondrosis of metatarsus, left foot: Secondary | ICD-10-CM | POA: Insufficient documentation

## 2018-01-04 DIAGNOSIS — L97509 Non-pressure chronic ulcer of other part of unspecified foot with unspecified severity: Secondary | ICD-10-CM

## 2018-01-04 DIAGNOSIS — M19072 Primary osteoarthritis, left ankle and foot: Secondary | ICD-10-CM | POA: Diagnosis not present

## 2018-01-04 DIAGNOSIS — N186 End stage renal disease: Secondary | ICD-10-CM | POA: Diagnosis not present

## 2018-01-04 DIAGNOSIS — N2581 Secondary hyperparathyroidism of renal origin: Secondary | ICD-10-CM | POA: Diagnosis not present

## 2018-01-04 DIAGNOSIS — E1121 Type 2 diabetes mellitus with diabetic nephropathy: Secondary | ICD-10-CM | POA: Diagnosis not present

## 2018-01-04 DIAGNOSIS — E1169 Type 2 diabetes mellitus with other specified complication: Secondary | ICD-10-CM

## 2018-01-04 DIAGNOSIS — E11621 Type 2 diabetes mellitus with foot ulcer: Secondary | ICD-10-CM | POA: Diagnosis not present

## 2018-01-04 DIAGNOSIS — M868X7 Other osteomyelitis, ankle and foot: Secondary | ICD-10-CM | POA: Diagnosis not present

## 2018-01-05 DIAGNOSIS — L97512 Non-pressure chronic ulcer of other part of right foot with fat layer exposed: Secondary | ICD-10-CM | POA: Diagnosis not present

## 2018-01-05 DIAGNOSIS — L97522 Non-pressure chronic ulcer of other part of left foot with fat layer exposed: Secondary | ICD-10-CM | POA: Diagnosis not present

## 2018-01-05 DIAGNOSIS — L97526 Non-pressure chronic ulcer of other part of left foot with bone involvement without evidence of necrosis: Secondary | ICD-10-CM | POA: Diagnosis not present

## 2018-01-05 DIAGNOSIS — M8668 Other chronic osteomyelitis, other site: Secondary | ICD-10-CM | POA: Diagnosis not present

## 2018-01-05 DIAGNOSIS — I132 Hypertensive heart and chronic kidney disease with heart failure and with stage 5 chronic kidney disease, or end stage renal disease: Secondary | ICD-10-CM | POA: Diagnosis not present

## 2018-01-05 DIAGNOSIS — L97524 Non-pressure chronic ulcer of other part of left foot with necrosis of bone: Secondary | ICD-10-CM | POA: Diagnosis not present

## 2018-01-05 DIAGNOSIS — E11621 Type 2 diabetes mellitus with foot ulcer: Secondary | ICD-10-CM | POA: Diagnosis not present

## 2018-01-06 DIAGNOSIS — N2581 Secondary hyperparathyroidism of renal origin: Secondary | ICD-10-CM | POA: Diagnosis not present

## 2018-01-06 DIAGNOSIS — M869 Osteomyelitis, unspecified: Secondary | ICD-10-CM | POA: Diagnosis not present

## 2018-01-06 DIAGNOSIS — M868X7 Other osteomyelitis, ankle and foot: Secondary | ICD-10-CM | POA: Diagnosis not present

## 2018-01-06 DIAGNOSIS — N186 End stage renal disease: Secondary | ICD-10-CM | POA: Diagnosis not present

## 2018-01-06 DIAGNOSIS — E1121 Type 2 diabetes mellitus with diabetic nephropathy: Secondary | ICD-10-CM | POA: Diagnosis not present

## 2018-01-09 DIAGNOSIS — M868X7 Other osteomyelitis, ankle and foot: Secondary | ICD-10-CM | POA: Diagnosis not present

## 2018-01-09 DIAGNOSIS — N186 End stage renal disease: Secondary | ICD-10-CM | POA: Diagnosis not present

## 2018-01-09 DIAGNOSIS — E1121 Type 2 diabetes mellitus with diabetic nephropathy: Secondary | ICD-10-CM | POA: Diagnosis not present

## 2018-01-09 DIAGNOSIS — N2581 Secondary hyperparathyroidism of renal origin: Secondary | ICD-10-CM | POA: Diagnosis not present

## 2018-01-09 DIAGNOSIS — M869 Osteomyelitis, unspecified: Secondary | ICD-10-CM | POA: Diagnosis not present

## 2018-01-10 ENCOUNTER — Other Ambulatory Visit (HOSPITAL_COMMUNITY)
Admission: RE | Admit: 2018-01-10 | Discharge: 2018-01-10 | Disposition: A | Discharge status: Home / Self Care | Payer: Medicare Other | Source: Other Acute Inpatient Hospital | Attending: Internal Medicine | Admitting: Internal Medicine

## 2018-01-10 DIAGNOSIS — L97512 Non-pressure chronic ulcer of other part of right foot with fat layer exposed: Secondary | ICD-10-CM | POA: Diagnosis not present

## 2018-01-10 DIAGNOSIS — I132 Hypertensive heart and chronic kidney disease with heart failure and with stage 5 chronic kidney disease, or end stage renal disease: Secondary | ICD-10-CM | POA: Diagnosis not present

## 2018-01-10 DIAGNOSIS — E11621 Type 2 diabetes mellitus with foot ulcer: Secondary | ICD-10-CM | POA: Diagnosis not present

## 2018-01-10 DIAGNOSIS — L97522 Non-pressure chronic ulcer of other part of left foot with fat layer exposed: Secondary | ICD-10-CM | POA: Diagnosis not present

## 2018-01-10 DIAGNOSIS — L97524 Non-pressure chronic ulcer of other part of left foot with necrosis of bone: Secondary | ICD-10-CM | POA: Diagnosis not present

## 2018-01-11 DIAGNOSIS — M868X7 Other osteomyelitis, ankle and foot: Secondary | ICD-10-CM | POA: Diagnosis not present

## 2018-01-11 DIAGNOSIS — N2581 Secondary hyperparathyroidism of renal origin: Secondary | ICD-10-CM | POA: Diagnosis not present

## 2018-01-11 DIAGNOSIS — N186 End stage renal disease: Secondary | ICD-10-CM | POA: Diagnosis not present

## 2018-01-11 DIAGNOSIS — E1121 Type 2 diabetes mellitus with diabetic nephropathy: Secondary | ICD-10-CM | POA: Diagnosis not present

## 2018-01-11 DIAGNOSIS — M869 Osteomyelitis, unspecified: Secondary | ICD-10-CM | POA: Diagnosis not present

## 2018-01-12 ENCOUNTER — Ambulatory Visit (HOSPITAL_COMMUNITY)
Admission: RE | Admit: 2018-01-12 | Discharge: 2018-01-12 | Disposition: A | Discharge status: Home / Self Care | Payer: Medicare Other | Source: Ambulatory Visit | Attending: Internal Medicine | Admitting: Internal Medicine

## 2018-01-12 ENCOUNTER — Ambulatory Visit (INDEPENDENT_AMBULATORY_CARE_PROVIDER_SITE_OTHER): Payer: Medicare Other | Admitting: Infectious Diseases

## 2018-01-12 ENCOUNTER — Other Ambulatory Visit: Payer: Self-pay | Admitting: Internal Medicine

## 2018-01-12 DIAGNOSIS — M86171 Other acute osteomyelitis, right ankle and foot: Secondary | ICD-10-CM | POA: Insufficient documentation

## 2018-01-12 DIAGNOSIS — M86271 Subacute osteomyelitis, right ankle and foot: Secondary | ICD-10-CM | POA: Diagnosis not present

## 2018-01-12 DIAGNOSIS — Z992 Dependence on renal dialysis: Secondary | ICD-10-CM | POA: Diagnosis not present

## 2018-01-12 DIAGNOSIS — E1121 Type 2 diabetes mellitus with diabetic nephropathy: Secondary | ICD-10-CM

## 2018-01-12 DIAGNOSIS — M86572 Other chronic hematogenous osteomyelitis, left ankle and foot: Secondary | ICD-10-CM

## 2018-01-12 DIAGNOSIS — N186 End stage renal disease: Secondary | ICD-10-CM | POA: Diagnosis not present

## 2018-01-12 NOTE — Assessment & Plan Note (Signed)
Will have him see ortho He would like to keep his feet Will restart his anbx at HD (vanco/ceftaz) Will see him back in 3 weeks.

## 2018-01-12 NOTE — Assessment & Plan Note (Addendum)
Will have him see ortho He would like to keep his feet Will restart his anbx at HD (vanco/ceftaz) Will see him back in 3 weeks.

## 2018-01-12 NOTE — Progress Notes (Signed)
   Subjective:    Patient ID: [name] BERTI Sr., male    DOB: 14-Dec-1951, 67 y.o.   MRN: [id number]  HPI 67 yo M with hx of DM2, ESRD, CAD, and ulcers on R first metatarsal head; L 5th and L 1st MT heads.  He was seen in ID on 05-16-17 with worsening of his R 5th ray and was sent to hospital. His wound Cx was polymicrobial. 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 His BCx were negative.  He was kept on vanco/ceftaz/flagyl for 2 weeks post-op.  He was seen in ID on 6-21 and was watched off anbx.   He was seen at [facility], had VAC, hyperbaric O2.  Last A1C was 9.X%.   He was restarted on his anbx at HD (vanco/ceftaz/flagyl), completed 6 weeks on 08-15-17. At his last ID visit he was watched off anbx.  He was able to start wearing shoes again in November.   Since Dec he has had slight increase in size of his L lateral foot. He has also developed an ulcer under his R 2nd MT head.  He was seen in [facility] and had MRI which showed osteo of base of his L foot. He was started on bactrim --> levaquin. He had wound Cx done which showed- MSSA and Strep agalacticae. He had some chill/sweats at that time.   States last WOC did not probe to bone on his L lateral wound.  Since his toe nail on his R 3rd toe fell off and he could probe to bone.  Cx 1-15 pending.   He has continued to f/u with Dr Arty Baumgartner.  His A1C increased in Dec.    MRI (01-14-18) IMPRESSION: 1. Interval development of abnormal signal from the bases of the third and fourth metatarsals and in the cuboid remnant consistent with osteomyelitis. Adjacent soft tissue ulceration and small abscess. 2. New arthritic changes between the navicular and medial cuneiform, at the second tarsal metatarsal joint, and new Freiberg infraction of the head of the second metatarsal.  Review of Systems  Constitutional: Negative for chills, fever and unexpected weight change.  Respiratory: Negative for shortness of  breath.   Gastrointestinal: Negative for constipation and diarrhea.  Genitourinary: Negative for difficulty urinating.  Skin: Positive for wound.  Please see HPI. All other systems reviewed and negative.  ophtho exam 01-23-18    Objective:   Physical Exam  Constitutional: He appears well-developed and well-nourished.  HENT:  Mouth/Throat: Abnormal dentition. Dental caries present. No oropharyngeal exudate.  Eyes: EOM are normal. Pupils are equal, round, and reactive to light.  Neck: Neck supple.  Cardiovascular: Normal rate, regular rhythm and normal heart sounds.  Pulmonary/Chest: Effort normal and breath sounds normal.  Abdominal: Soft. Bowel sounds are normal. There is no tenderness. There is no rebound.  Musculoskeletal: He exhibits no edema.       Feet:  Lymphadenopathy:    He has no cervical adenopathy.          Assessment & Plan:

## 2018-01-12 NOTE — Assessment & Plan Note (Signed)
He appears to be tolerating HD well.  His AVF is clean with good bruit.

## 2018-01-12 NOTE — Assessment & Plan Note (Signed)
He needs improved control I reinforced this to him.  F/u with PCP

## 2018-01-13 DIAGNOSIS — N2581 Secondary hyperparathyroidism of renal origin: Secondary | ICD-10-CM | POA: Diagnosis not present

## 2018-01-13 DIAGNOSIS — M869 Osteomyelitis, unspecified: Secondary | ICD-10-CM | POA: Diagnosis not present

## 2018-01-13 DIAGNOSIS — E1121 Type 2 diabetes mellitus with diabetic nephropathy: Secondary | ICD-10-CM | POA: Diagnosis not present

## 2018-01-13 DIAGNOSIS — M868X7 Other osteomyelitis, ankle and foot: Secondary | ICD-10-CM | POA: Diagnosis not present

## 2018-01-13 DIAGNOSIS — N186 End stage renal disease: Secondary | ICD-10-CM | POA: Diagnosis not present

## 2018-01-13 LAB — AEROBIC CULTURE W GRAM STAIN (SUPERFICIAL SPECIMEN)

## 2018-01-16 ENCOUNTER — Telehealth: Payer: Self-pay | Admitting: Family

## 2018-01-16 DIAGNOSIS — N2581 Secondary hyperparathyroidism of renal origin: Secondary | ICD-10-CM | POA: Diagnosis not present

## 2018-01-16 DIAGNOSIS — M868X7 Other osteomyelitis, ankle and foot: Secondary | ICD-10-CM | POA: Diagnosis not present

## 2018-01-16 DIAGNOSIS — N186 End stage renal disease: Secondary | ICD-10-CM | POA: Diagnosis not present

## 2018-01-16 DIAGNOSIS — E1121 Type 2 diabetes mellitus with diabetic nephropathy: Secondary | ICD-10-CM | POA: Diagnosis not present

## 2018-01-16 DIAGNOSIS — M869 Osteomyelitis, unspecified: Secondary | ICD-10-CM | POA: Diagnosis not present

## 2018-01-16 NOTE — Telephone Encounter (Signed)
Patient has been seen by Dr. [name] and told that he has osteomyelitis and would like to discuss his treatment.

## 2018-01-17 DIAGNOSIS — M79671 Pain in right foot: Secondary | ICD-10-CM | POA: Diagnosis not present

## 2018-01-17 DIAGNOSIS — L97512 Non-pressure chronic ulcer of other part of right foot with fat layer exposed: Secondary | ICD-10-CM | POA: Diagnosis not present

## 2018-01-17 DIAGNOSIS — M79672 Pain in left foot: Secondary | ICD-10-CM | POA: Diagnosis not present

## 2018-01-17 DIAGNOSIS — I132 Hypertensive heart and chronic kidney disease with heart failure and with stage 5 chronic kidney disease, or end stage renal disease: Secondary | ICD-10-CM | POA: Diagnosis not present

## 2018-01-17 DIAGNOSIS — L97522 Non-pressure chronic ulcer of other part of left foot with fat layer exposed: Secondary | ICD-10-CM | POA: Diagnosis not present

## 2018-01-17 DIAGNOSIS — E11621 Type 2 diabetes mellitus with foot ulcer: Secondary | ICD-10-CM | POA: Diagnosis not present

## 2018-01-17 DIAGNOSIS — L97524 Non-pressure chronic ulcer of other part of left foot with necrosis of bone: Secondary | ICD-10-CM | POA: Diagnosis not present

## 2018-01-17 NOTE — Telephone Encounter (Signed)
2-7 should be fine Can you ask him if he wants me to call him rather than wait?

## 2018-01-17 NOTE — Telephone Encounter (Signed)
He has a follow up visit 02/02/18, do you want him in sooner?

## 2018-01-18 DIAGNOSIS — M869 Osteomyelitis, unspecified: Secondary | ICD-10-CM | POA: Diagnosis not present

## 2018-01-18 DIAGNOSIS — N186 End stage renal disease: Secondary | ICD-10-CM | POA: Diagnosis not present

## 2018-01-18 DIAGNOSIS — N2581 Secondary hyperparathyroidism of renal origin: Secondary | ICD-10-CM | POA: Diagnosis not present

## 2018-01-18 DIAGNOSIS — M868X7 Other osteomyelitis, ankle and foot: Secondary | ICD-10-CM | POA: Diagnosis not present

## 2018-01-18 DIAGNOSIS — E1121 Type 2 diabetes mellitus with diabetic nephropathy: Secondary | ICD-10-CM | POA: Diagnosis not present

## 2018-01-19 DIAGNOSIS — H35371 Puckering of macula, right eye: Secondary | ICD-10-CM | POA: Diagnosis not present

## 2018-01-19 DIAGNOSIS — E113412 Type 2 diabetes mellitus with severe nonproliferative diabetic retinopathy with macular edema, left eye: Secondary | ICD-10-CM | POA: Diagnosis not present

## 2018-01-19 DIAGNOSIS — H33012 Retinal detachment with single break, left eye: Secondary | ICD-10-CM | POA: Diagnosis not present

## 2018-01-19 DIAGNOSIS — H25811 Combined forms of age-related cataract, right eye: Secondary | ICD-10-CM | POA: Diagnosis not present

## 2018-01-19 DIAGNOSIS — H40003 Preglaucoma, unspecified, bilateral: Secondary | ICD-10-CM | POA: Diagnosis not present

## 2018-01-19 DIAGNOSIS — E113521 Type 2 diabetes mellitus with proliferative diabetic retinopathy with traction retinal detachment involving the macula, right eye: Secondary | ICD-10-CM | POA: Diagnosis not present

## 2018-01-20 DIAGNOSIS — M869 Osteomyelitis, unspecified: Secondary | ICD-10-CM | POA: Diagnosis not present

## 2018-01-20 DIAGNOSIS — M868X7 Other osteomyelitis, ankle and foot: Secondary | ICD-10-CM | POA: Diagnosis not present

## 2018-01-20 DIAGNOSIS — N186 End stage renal disease: Secondary | ICD-10-CM | POA: Diagnosis not present

## 2018-01-20 DIAGNOSIS — E1121 Type 2 diabetes mellitus with diabetic nephropathy: Secondary | ICD-10-CM | POA: Diagnosis not present

## 2018-01-20 DIAGNOSIS — N2581 Secondary hyperparathyroidism of renal origin: Secondary | ICD-10-CM | POA: Diagnosis not present

## 2018-01-23 ENCOUNTER — Other Ambulatory Visit: Payer: Self-pay | Admitting: *Deleted

## 2018-01-23 DIAGNOSIS — E1121 Type 2 diabetes mellitus with diabetic nephropathy: Secondary | ICD-10-CM | POA: Diagnosis not present

## 2018-01-23 DIAGNOSIS — N2581 Secondary hyperparathyroidism of renal origin: Secondary | ICD-10-CM | POA: Diagnosis not present

## 2018-01-23 DIAGNOSIS — M868X7 Other osteomyelitis, ankle and foot: Secondary | ICD-10-CM | POA: Diagnosis not present

## 2018-01-23 DIAGNOSIS — N186 End stage renal disease: Secondary | ICD-10-CM | POA: Diagnosis not present

## 2018-01-23 DIAGNOSIS — M869 Osteomyelitis, unspecified: Secondary | ICD-10-CM | POA: Diagnosis not present

## 2018-01-24 ENCOUNTER — Telehealth: Payer: Self-pay | Admitting: Cardiology

## 2018-01-24 DIAGNOSIS — L97524 Non-pressure chronic ulcer of other part of left foot with necrosis of bone: Secondary | ICD-10-CM | POA: Diagnosis not present

## 2018-01-24 DIAGNOSIS — L97522 Non-pressure chronic ulcer of other part of left foot with fat layer exposed: Secondary | ICD-10-CM | POA: Diagnosis not present

## 2018-01-24 DIAGNOSIS — I132 Hypertensive heart and chronic kidney disease with heart failure and with stage 5 chronic kidney disease, or end stage renal disease: Secondary | ICD-10-CM | POA: Diagnosis not present

## 2018-01-24 DIAGNOSIS — E11621 Type 2 diabetes mellitus with foot ulcer: Secondary | ICD-10-CM | POA: Diagnosis not present

## 2018-01-24 DIAGNOSIS — L97512 Non-pressure chronic ulcer of other part of right foot with fat layer exposed: Secondary | ICD-10-CM | POA: Diagnosis not present

## 2018-01-24 NOTE — Telephone Encounter (Signed)
   Cardiologist:  Dr. Stanford Breed / Dr. Gwenlyn Found ([facility])  Chart reviewed as part of pre-operative protocol coverage. Because of [name] BRUMBACH Sr.'s past medical history and time since last visit, he/she will require a follow-up visit in order to better assess preoperative cardiovascular risk.  Last seen by Almyra Deforest 01/10/2017.  Pre-op covering staff: - Please schedule appointment and call patient to inform them. - Please contact requesting surgeon's office via preferred method (i.e, phone, fax) to inform them of need for appointment prior to surgery.  [place], [place]  01/24/2018, 1:22 PM

## 2018-01-24 NOTE — Telephone Encounter (Signed)
New message       [facility] Pre-operative Risk Assessment    Request for surgical clearance:  1. What type of surgery is being performed? 2nd and 3rd toe amputation  2. When is this surgery scheduled? 01/27/2018  3. What type of clearance is required (medical clearance vs. Pharmacy clearance to hold med vs. Both)? Both  4. Are there any medications that need to be held prior to surgery and how long?n/a  5. Practice name and name of physician performing surgery? Dr [name]  6. What is your office phone and fax number? Fax [phone] ATTN: [name]  7. Anesthesia type (None, local, MAC, general) ? general   [name] 01/24/2018, 11:41 AM  _________________________________________________________________   (provider comments below)

## 2018-01-24 NOTE — Telephone Encounter (Signed)
Patient already has appointment with PA tomorrow morning.

## 2018-01-24 NOTE — Progress Notes (Signed)
Cardiology Office Note   Date:  01/25/2018   ID:  [name] MONGER Sr., DOB [birth year], MRN [id number]  PCP:  [name], MD  Cardiologist: Dr. Stanford Breed  Chief Complaint  Patient presents with  . Follow-up    Surgical clearance.     History of Present Illness: [name]. is a 67 y.o. male who presents for preoperative cardiac evaluation, and has planned second and third toe amputation on 01/27/2018.  Patient was last seen in the office on 01/10/2017 for surgical clearance.  She has a history of nonischemic cardiamyopathy, moderate to severe TR, ESRD, PAD with history of osteomyelitis of the foot.  From a cardiac standpoint she has been previously treated for CHF.  Most recent echocardiogram in 2016 revealed a normal LV systolic function of 46-96% with mild LVH no regional wall motion abnormalities.  The patient is also followed by Dr. Alvester Chou in the setting of peripheral arterial disease.  Most recent ABIs were in 2016 and were normal.  He comes today without any complaints of chest pain, dyspnea on exertion, dizziness, or fatigue.  He has no pain in his lower extremities and feet despite multiple wounds and dressings.  The patient is on IV vancomycin and Fortaz for preoperative antibiotic therapy, and is followed by the wound clinic closely.  Is medically compliant.  Has not had any new diagnoses, or medication changes.  Past Medical History:  Diagnosis Date  . Cardiomyopathy ([place])   . Diabetes mellitus   . Diabetic foot ulcer ([place])   . Diabetic infection of right foot ([place])   . Diabetic peripheral neuropathy ([place])   . ESRD (end stage renal disease) ([place])   . Headache   . HTN (hypertension)   . Osteomyelitis ([place])   . Peripheral vascular disease ([place])    critical limb ischemia    Past Surgical History:  Procedure Laterality Date  . AMPUTATION TOE Right 01/21/2017   Procedure: AMPUTATION 2ND TOE RIGHT FOOT ;  Surgeon: Dorna Leitz, MD;  Location: [place];  Service:  Orthopedics;  Laterality: Right;  . AMPUTATION TOE Left 05/18/2017   Procedure: 5TH RAY AMPUTATION  and EXCISION CUBOID;  Surgeon: Dorna Leitz, MD;  Location: [place];  Service: Orthopedics;  Laterality: Left;  . AV FISTULA PLACEMENT Left 11/27/2014   Procedure: ARTERIOVENOUS (AV) FISTULA CREATION;  Surgeon: Conrad Leonville, MD;  Location: [place];  Service: Vascular;  Laterality: Left;  . CHOLECYSTECTOMY    . COLONOSCOPY N/A 11/28/2014   Procedure: COLONOSCOPY;  Surgeon: Ladene Artist, MD;  Location: [facility] ENDOSCOPY;  Service: Endoscopy;  Laterality: N/A;  . ESOPHAGOGASTRODUODENOSCOPY N/A 11/28/2014   Procedure: ESOPHAGOGASTRODUODENOSCOPY (EGD);  Surgeon: Ladene Artist, MD;  Location: [facility] ENDOSCOPY;  Service: Endoscopy;  Laterality: N/A;  . I&D EXTREMITY  01/28/2012   Procedure: IRRIGATION AND DEBRIDEMENT EXTREMITY;  Surgeon: Paulene Floor, MD;  [facility];  Service: Orthopedics;  Laterality: Left;  irrigation and drainage left hand  . I&D EXTREMITY  01/30/2012   Procedure: IRRIGATION AND DEBRIDEMENT EXTREMITY;  Surgeon: Paulene Floor, MD;  [facility];  Service: Orthopedics;  Laterality: Left;  flexor teno synovectomy and extensor tenosynovectomy arthrosynovectomy wristjoint  . I&D EXTREMITY  02/01/2012   Procedure: IRRIGATION AND DEBRIDEMENT EXTREMITY;  Surgeon: Paulene Floor, MD;  [facility];  Service: Orthopedics;  Laterality: Left;  Left Wrist, Hand and Forearm  . IRRIGATION AND DEBRIDEMENT FOOT Left 01/21/2017   Procedure: DEBRIDEMENT LEFT PLANTAR WOUND;  Surgeon:  Dorna Leitz, MD;  Location: [place];  Service: Orthopedics;  Laterality: Left;  . Left 4th toe amputation    . LEFT AND RIGHT HEART CATHETERIZATION WITH CORONARY ANGIOGRAM N/A 11/26/2014   Procedure: LEFT AND RIGHT HEART CATHETERIZATION WITH CORONARY ANGIOGRAM;  Surgeon: Leonie Man, MD;  Location: [facility] CATH LAB;  Service: Cardiovascular;  Laterality: N/A;  . TONSILLECTOMY       Current Outpatient  Medications  Medication Sig Dispense Refill  . acetaminophen (TYLENOL) 325 MG tablet Take 650 mg by mouth every 6 (six) hours as needed (pain).     . Ascorbic Acid (VITAMIN C PO) Take 1 tablet by mouth daily.    Marland Kitchen atorvastatin (LIPITOR) 10 MG tablet Take 10 mg by mouth daily.    . carvedilol (COREG) 3.125 MG tablet Take 1 tablet (3.125 mg total) by mouth 2 (two) times daily. 60 tablet 6  . cinacalcet (SENSIPAR) 90 MG tablet Take 90 mg by mouth daily with breakfast.    . Ferric Citrate (AURYXIA) 1 GM 210 MG(Fe) TABS Take 210 mg by mouth 3 (three) times daily with meals.     Marland Kitchen glucose monitoring kit (FREESTYLE) monitoring kit 1 each by Does not apply route 4 (four) times daily - after meals and at bedtime. 1 month Diabetic Testing Supplies for QAC-QHS accuchecks.Any brand OK 1 each 1  . HYDROcodone-acetaminophen (NORCO/VICODIN) 5-325 MG tablet Take 1-2 tablets by mouth every 6 (six) hours as needed for moderate pain. 30 tablet 0  . insulin aspart (NOVOLOG) 100 UNIT/ML injection Before each meal 3 times a day, 140-199 - 2 units, 200-250 - 4 units, 251-299 - 6 units,  300-349 - 8 units,  350 or above 10 units. Insulin PEN if approved, provide syringes and needles if needed. (Patient taking differently: Inject 2-5 Units into the skin 3 (three) times daily as needed for high blood sugar (CBg >168). Per sliding scale) 10 mL 11  . Insulin Syringe-Needle U-100 25G X 1" 1 ML MISC Any brand, for 4 times a day insulin SQ, 1 month supply. 30 each 0  . multivitamin (RENA-VIT) TABS tablet Take 1 tablet by mouth at bedtime. 30 each 0  . mupirocin ointment (BACTROBAN) 2 % Apply 1 application topically daily as needed (rash on stomach).   0  . primidone (MYSOLINE) 50 MG tablet Take 0.5 tablets (25 mg total) by mouth at bedtime. 30 tablet 0   No current facility-administered medications for this visit.     Allergies:   Penicillins    Social History:  The patient  reports that he has quit smoking. His smoking use  included cigarettes. He has a 2.00 pack-year smoking history. he has never used smokeless tobacco. He reports that he does not drink alcohol or use drugs.   Family History:  The patient's family history includes Diabetes in his brother, brother, brother, father, and sister; Heart disease in his mother.    ROS: All other systems are reviewed and negative. Unless otherwise mentioned in H&P    PHYSICAL EXAM: VS:  BP 130/78   Pulse 78   Ht '6\' 1"'  (1.854 m)   Wt 216 lb (98 kg)   BMI 28.50 kg/m  , BMI Body mass index is 28.5 kg/m. GEN: Well nourished, well developed, in no acute distress  HEENT: normal  Neck: no JVD, carotid bruits, or masses Cardiac: RRR; diastolic flow murmurs, rubs, or gallops,no edema  Respiratory:  clear to auscultation bilaterally, normal work of breathing GI: soft, nontender, nondistended, +  BS MS: no deformity or atrophy left arm AV fistula noted with good palpable thrill no evidence of infection. Bilateral feet have dressing, with second toe on left foot dry, without coloration, right foot multiple missing toes with dressing and some old blood drainage on plantar aspect.  No odor. Skin: warm and dry, no rash Neuro:  Strength and sensation are intact Psych: euthymic mood, full affect   EKG: This rhythm with left bundle branch block, heart rate of 78 bpm.  No acute T wave abnormalities noted.  Recent Labs: 05/17/2017: ALT 9 05/25/2017: BUN 61; Creatinine, Ser 12.01; Hemoglobin 11.6; Platelets 389; Potassium 4.4; Sodium 132    Lipid Panel    Component Value Date/Time   CHOL 75 11/24/2014 0407   TRIG 49 11/24/2014 0407   HDL 22 (L) 11/24/2014 0407   CHOLHDL 3.4 11/24/2014 0407   VLDL 10 11/24/2014 0407   LDLCALC 43 11/24/2014 0407      Wt Readings from Last 3 Encounters:  01/25/18 216 lb (98 kg)  01/12/18 208 lb 12.8 oz (94.7 kg)  10/05/17 194 lb (88 kg)      Other studies Reviewed: Echocardiogram 2015/05/26 Left ventricle: The cavity size was  normal. Wall thickness was   increased in a pattern of mild LVH. There was mild focal basal   hypertrophy of the septum. Systolic function was normal. The   estimated ejection fraction was in the range of 55% to 60%. Wall   motion was normal; there were no regional wall motion   abnormalities. Doppler parameters are consistent with abnormal   left ventricular relaxation (grade 1 diastolic dysfunction). - Mitral valve: Calcified annulus. - Left atrium: The atrium was mildly dilated. - Right ventricle: The cavity size was mildly dilated. - Right atrium: The atrium was mildly dilated.  ASSESSMENT AND PLAN:  1.  Preoperative evaluation: The patient is stable from a cardiovascular standpoint and we will not require any further testing.  He is of low to moderate risk to proceed with surgical amputation of toes on the right foot.  Would not recommend discontinuing any cardiac medications.  Due to diabetes he will need to hold his insulin in the morning as he will be n.p.o.  I have discussed this with the patient.  2.  Hypertension: Blood pressure is well controlled currently.  He has not had a repeat echocardiogram since 2016.  Due to LVH, severe TR, and diastolic dysfunction, will repeat within the next couple of months to evaluate his status.  I have explained to the patient he will need to be seen annually for ongoing cardiac evaluation in the setting of multiple cardiovascular risk factors to include hypertension, diabetes, and hypercholesterolemia.  He verbalizes understanding.  3.  Hypercholesterolemia: Followed by PCP.  If no new labs available on follow-up appointment will need to recheck these.  Continue rosuvastatin.   Current medicines are reviewed at length with the patient today.    Labs/ tests ordered today include: Echocardiogram Phill Myron. [place] Pugh, ANP, AACC   01/25/2018 8:37 AM    [facility] [address] [place], [place] [address] Phone: [phone]; Fax: [phone]

## 2018-01-25 ENCOUNTER — Other Ambulatory Visit: Payer: Self-pay | Admitting: Orthopedic Surgery

## 2018-01-25 ENCOUNTER — Ambulatory Visit (INDEPENDENT_AMBULATORY_CARE_PROVIDER_SITE_OTHER): Payer: Medicare Other | Admitting: Adult Health

## 2018-01-25 ENCOUNTER — Encounter: Payer: Self-pay | Admitting: Adult Health

## 2018-01-25 ENCOUNTER — Ambulatory Visit: Payer: Medicare Other | Admitting: Cardiology

## 2018-01-25 VITALS — BP 130/78 | HR 78 | Ht 73.0 in | Wt 216.0 lb

## 2018-01-25 DIAGNOSIS — N2581 Secondary hyperparathyroidism of renal origin: Secondary | ICD-10-CM | POA: Diagnosis not present

## 2018-01-25 DIAGNOSIS — M869 Osteomyelitis, unspecified: Secondary | ICD-10-CM | POA: Diagnosis not present

## 2018-01-25 DIAGNOSIS — Z01810 Encounter for preprocedural cardiovascular examination: Secondary | ICD-10-CM

## 2018-01-25 DIAGNOSIS — I5021 Acute systolic (congestive) heart failure: Secondary | ICD-10-CM

## 2018-01-25 DIAGNOSIS — I739 Peripheral vascular disease, unspecified: Secondary | ICD-10-CM | POA: Diagnosis not present

## 2018-01-25 DIAGNOSIS — N186 End stage renal disease: Secondary | ICD-10-CM | POA: Diagnosis not present

## 2018-01-25 DIAGNOSIS — R0600 Dyspnea, unspecified: Secondary | ICD-10-CM

## 2018-01-25 DIAGNOSIS — I517 Cardiomegaly: Secondary | ICD-10-CM

## 2018-01-25 DIAGNOSIS — M868X7 Other osteomyelitis, ankle and foot: Secondary | ICD-10-CM | POA: Diagnosis not present

## 2018-01-25 DIAGNOSIS — E1121 Type 2 diabetes mellitus with diabetic nephropathy: Secondary | ICD-10-CM | POA: Diagnosis not present

## 2018-01-25 NOTE — Patient Instructions (Signed)
Medication Instructions:  NO CHANGES-Your physician recommends that you continue on your current medications as directed. Please refer to the Current Medication list given to you today.  If you need a refill on your cardiac medications before your next appointment, please call your pharmacy.  Testing/Procedures: Echocardiogram - (IN 6 WEEKS -2 MONTHS)Your physician has requested that you have an echocardiogram. Echocardiography is a painless test that uses sound waves to create images of your heart. It provides your doctor with information about the size and shape of your heart and how well your heart's chambers and valves are working. This procedure takes approximately one hour. There are no restrictions for this procedure. This will be performed at our [facility] location - [address], [address].  Special Instructions: CLEARED FOR SCHEDULED SURGERY-RECOMMEND TO HOLD INSULIN BEFORE  Follow-Up: Your physician wants you to follow-up in: 90 MONTHS WITH DR Stanford Breed You should receive a reminder letter in the mail two months in advance. If you do not receive a letter, please call our office 10-2018 to schedule the 12-2018 follow-up appointment.   Thank you for choosing [name] at [facility]!!

## 2018-01-26 ENCOUNTER — Encounter (HOSPITAL_COMMUNITY): Payer: Self-pay | Admitting: *Deleted

## 2018-01-26 ENCOUNTER — Other Ambulatory Visit: Payer: Self-pay

## 2018-01-26 DIAGNOSIS — E1122 Type 2 diabetes mellitus with diabetic chronic kidney disease: Secondary | ICD-10-CM | POA: Diagnosis not present

## 2018-01-26 DIAGNOSIS — N186 End stage renal disease: Secondary | ICD-10-CM | POA: Diagnosis not present

## 2018-01-26 DIAGNOSIS — Z992 Dependence on renal dialysis: Secondary | ICD-10-CM | POA: Diagnosis not present

## 2018-01-26 NOTE — Progress Notes (Signed)
Need orders in epic.  Surgery on 01/27/18.

## 2018-01-26 NOTE — H&P (Signed)
PREOPERATIVE H&P  Chief Complaint: osteo and open wounds bilat feet  HPI: [name] CARNERO Sr. is a 68 y.o. male who presents for evaluation of osteo of bilat feet with open wounds. It has been present for many months and has been worsening. He has failed conservative measures. Pain is rated as moderate.  Past Medical History:  Diagnosis Date  . Cardiomyopathy ([place])   . Cataract    right eye  . Diabetes mellitus    type 2  . Diabetic foot ulcer ([place])   . Diabetic infection of right foot ([place])   . Diabetic peripheral neuropathy ([place])   . ESRD (end stage renal disease) ([place])    m-w-f [address]  . Glaucoma    left eye  . Headache   . HTN (hypertension)   . Osteomyelitis ([place])   . Peripheral vascular disease ([place])    critical limb ischemia   Past Surgical History:  Procedure Laterality Date  . AMPUTATION TOE Right 01/21/2017   Procedure: AMPUTATION 2ND TOE RIGHT FOOT ;  Surgeon: Dorna Leitz, MD;  Location: [place];  Service: Orthopedics;  Laterality: Right;  . AMPUTATION TOE Left 05/18/2017   Procedure: 5TH RAY AMPUTATION  and EXCISION CUBOID;  Surgeon: Dorna Leitz, MD;  Location: [place];  Service: Orthopedics;  Laterality: Left;  . AV FISTULA PLACEMENT Left 11/27/2014   Procedure: ARTERIOVENOUS (AV) FISTULA CREATION;  Surgeon: Conrad Purvis, MD;  Location: [place];  Service: Vascular;  Laterality: Left;  . CHOLECYSTECTOMY    . COLONOSCOPY N/A 11/28/2014   Procedure: COLONOSCOPY;  Surgeon: Ladene Artist, MD;  Location: [facility] ENDOSCOPY;  Service: Endoscopy;  Laterality: N/A;  . ESOPHAGOGASTRODUODENOSCOPY N/A 11/28/2014   Procedure: ESOPHAGOGASTRODUODENOSCOPY (EGD);  Surgeon: Ladene Artist, MD;  Location: [facility] ENDOSCOPY;  Service: Endoscopy;  Laterality: N/A;  . EYE SURGERY     retina and cataract left eye surgery  . I&D EXTREMITY  01/28/2012   Procedure: IRRIGATION AND DEBRIDEMENT EXTREMITY;  Surgeon: Paulene Floor, MD;  [facility];  Service:  Orthopedics;  Laterality: Left;  irrigation and drainage left hand  . I&D EXTREMITY  01/30/2012   Procedure: IRRIGATION AND DEBRIDEMENT EXTREMITY;  Surgeon: Paulene Floor, MD;  [facility];  Service: Orthopedics;  Laterality: Left;  flexor teno synovectomy and extensor tenosynovectomy arthrosynovectomy wristjoint  . I&D EXTREMITY  02/01/2012   Procedure: IRRIGATION AND DEBRIDEMENT EXTREMITY;  Surgeon: Paulene Floor, MD;  [facility];  Service: Orthopedics;  Laterality: Left;  Left Wrist, Hand and Forearm  . IRRIGATION AND DEBRIDEMENT FOOT Left 01/21/2017   Procedure: DEBRIDEMENT LEFT PLANTAR WOUND;  Surgeon: Dorna Leitz, MD;  Location: [place];  Service: Orthopedics;  Laterality: Left;  . Left 4th toe amputation    . LEFT AND RIGHT HEART CATHETERIZATION WITH CORONARY ANGIOGRAM N/A 11/26/2014   Procedure: LEFT AND RIGHT HEART CATHETERIZATION WITH CORONARY ANGIOGRAM;  Surgeon: Leonie Man, MD;  Location: [facility] CATH LAB;  Service: Cardiovascular;  Laterality: N/A;  . TONSILLECTOMY     Social History   Socioeconomic History  . Marital status: Married    Spouse name: [name]  . Number of children: 3  . Years of education: 71  . Highest education level: None  Social Needs  . Financial resource strain: None  . Food insecurity - worry: None  . Food insecurity - inability: None  . Transportation needs - medical: None  . Transportation needs - non-medical: None  Occupational History  .  Occupation: Printmaker: [employer]  Tobacco Use  . Smoking status: Former Smoker    Packs/day: 0.50    Years: 4.00    Pack years: 2.00    Types: Cigarettes  . Smokeless tobacco: Never Used  . Tobacco comment: about 40 yrs ago  Substance and Sexual Activity  . Alcohol use: No    Alcohol/week: 0.0 oz  . Drug use: No  . Sexual activity: Yes  Other Topics Concern  . None  Social History Narrative  . None   Family History  Problem Relation Age of Onset  .  Heart disease Mother   . Diabetes Father   . Diabetes Brother   . Diabetes Sister   . Diabetes Brother   . Diabetes Brother    Allergies  Allergen Reactions  . Penicillins Hives and Swelling    Ankle swelling  PATIENT HAD A PCN REACTION WITH IMMEDIATE RASH, FACIAL/TONGUE/THROAT SWELLING, SOB, OR LIGHTHEADEDNESS WITH HYPOTENSION:  #  #  #  YES  #  #  #   Has patient had a PCN reaction causing severe rash involving mucus membranes or skin necrosis: no Has patient had a PCN reaction that required hospitalization: no Has patient had a PCN reaction occurring within the last 10 years: no If all of the above answers are "NO", then may proceed with Cephalosporin use.   Prior to Admission medications   Medication Sig Start Date End Date Taking? Authorizing Provider  acetaminophen (TYLENOL) 325 MG tablet Take 650 mg by mouth every 6 (six) hours as needed (pain).    Yes [provider]  Ascorbic Acid (VITAMIN C PO) Take 1 tablet by mouth daily.   Yes [provider]  atorvastatin (LIPITOR) 20 MG tablet Take 20 mg by mouth every evening.    Yes [provider]  carvedilol (COREG) 6.25 MG tablet Take 6.25 mg by mouth every morning.   Yes [provider]  cinacalcet (SENSIPAR) 90 MG tablet Take 90 mg by mouth See admin instructions. Given at dialysis   Yes [provider]  Ferric Citrate (AURYXIA) 1 GM 210 MG(Fe) TABS Take 420 mg by mouth 3 (three) times daily with meals.    Yes [provider]  HYDROcodone-acetaminophen (NORCO/VICODIN) 5-325 MG tablet Take 1-2 tablets by mouth every 6 (six) hours as needed for moderate pain. 01/21/17  Yes Gary Fleet, PA-C  insulin NPH-regular Human (NOVOLIN 70/30) (70-30) 100 UNIT/ML injection Inject 1-5 Units into the skin 2 (two) times daily with a meal. Sliding Scale   Yes [provider]  Multiple Vitamins-Minerals (MULTIVITAMIN WITH MINERALS) tablet Take 1 tablet by mouth daily.   Yes [provider]  multivitamin (RENA-VIT) TABS tablet Take 1 tablet by mouth at bedtime. 11/29/14  Yes Thurnell Lose, MD  mupirocin ointment (BACTROBAN) 2 % Apply 1 application topically daily as needed (rash on stomach).  08/05/16  Yes [provider]  primidone (MYSOLINE) 50 MG tablet Take 0.5 tablets (25 mg total) by mouth at bedtime. Patient taking differently: Take 50 mg by mouth at bedtime.  05/25/17  Yes Patrecia Pour, MD  timolol (TIMOPTIC) 0.5 % ophthalmic solution Place 1 drop into the left eye 2 (two) times daily.   Yes [provider]  glucose monitoring kit (FREESTYLE) monitoring kit 1 each by Does not apply route 4 (four) times daily - after meals and at bedtime. 1 month Diabetic Testing Supplies for QAC-QHS accuchecks.Any brand OK 11/29/14  Thurnell Lose, MD  insulin aspart (NOVOLOG) 100 UNIT/ML injection Before each meal 3 times a day, 140-199 - 2 units, 200-250 - 4 units, 251-299 - 6 units,  300-349 - 8 units,  350 or above 10 units. Insulin PEN if approved, provide syringes and needles if needed. Patient not taking: Reported on 01/25/2018 11/29/14   Thurnell Lose, MD  Insulin Syringe-Needle U-100 25G X 1" 1 ML MISC Any brand, for 4 times a day insulin SQ, 1 month supply. 11/29/14   Thurnell Lose, MD     Positive ROS: none  All other systems have been reviewed and were otherwise negative with the exception of those mentioned in the HPI and as above.  Physical Exam: There were no vitals filed for this visit.  General: Alert, no acute distress Cardiovascular: No pedal edema Respiratory: No cyanosis, no use of accessory musculature GI: No organomegaly, abdomen is soft and non-tender Skin: No lesions in the area of chief complaint Neurologic: Sensation intact distally Psychiatric: Patient is competent for consent with normal mood and affect Lymphatic: No axillary or cervical lymphadenopathy  MUSCULOSKELETAL: r foot open wound down to bone and osteo  2nd toe l foot  Assessment/Plan: RIGHT FOOT OSTEOMYELITIS THIRD TOE AND LEFT FOOT OSTEOMYELITIS OF SECOND TOE Plan for Procedure(s): RIGHT FOOT 3RD TOE AMPUTATION WITH WOUND DEBRIDEMENT. LEFT FOOT 2ND TOE AMPUTATION  The risks benefits and alternatives were discussed with the patient including but not limited to the risks of nonoperative treatment, versus surgical intervention including infection, bleeding, nerve injury, malunion, nonunion, hardware prominence, hardware failure, need for hardware removal, blood clots, cardiopulmonary complications, morbidity, mortality, among others, and they were willing to proceed.  Predicted outcome is good, although there will be at least a six to nine month expected recovery.  [name], MD 01/26/2018 9:36 PM

## 2018-01-27 ENCOUNTER — Encounter (HOSPITAL_COMMUNITY)
Admission: RE | Disposition: A | Discharge status: Home / Self Care | Payer: Self-pay | Source: Ambulatory Visit | Attending: Orthopedic Surgery

## 2018-01-27 ENCOUNTER — Ambulatory Visit (HOSPITAL_COMMUNITY): Payer: Medicare Other

## 2018-01-27 ENCOUNTER — Ambulatory Visit (HOSPITAL_COMMUNITY): Payer: Medicare Other | Admitting: Certified Registered"

## 2018-01-27 ENCOUNTER — Ambulatory Visit (HOSPITAL_COMMUNITY)
Admission: RE | Admit: 2018-01-27 | Discharge: 2018-01-27 | Disposition: A | Discharge status: Home / Self Care | Payer: Medicare Other | Source: Ambulatory Visit | Attending: Orthopedic Surgery | Admitting: Orthopedic Surgery

## 2018-01-27 ENCOUNTER — Encounter (HOSPITAL_COMMUNITY): Payer: Self-pay | Admitting: Certified Registered"

## 2018-01-27 DIAGNOSIS — Z79899 Other long term (current) drug therapy: Secondary | ICD-10-CM | POA: Diagnosis not present

## 2018-01-27 DIAGNOSIS — B351 Tinea unguium: Secondary | ICD-10-CM | POA: Diagnosis present

## 2018-01-27 DIAGNOSIS — E11621 Type 2 diabetes mellitus with foot ulcer: Secondary | ICD-10-CM | POA: Diagnosis not present

## 2018-01-27 DIAGNOSIS — I10 Essential (primary) hypertension: Secondary | ICD-10-CM | POA: Diagnosis not present

## 2018-01-27 DIAGNOSIS — S91301A Unspecified open wound, right foot, initial encounter: Secondary | ICD-10-CM | POA: Diagnosis not present

## 2018-01-27 DIAGNOSIS — L97518 Non-pressure chronic ulcer of other part of right foot with other specified severity: Secondary | ICD-10-CM | POA: Diagnosis not present

## 2018-01-27 DIAGNOSIS — E1169 Type 2 diabetes mellitus with other specified complication: Secondary | ICD-10-CM | POA: Diagnosis not present

## 2018-01-27 DIAGNOSIS — N186 End stage renal disease: Secondary | ICD-10-CM | POA: Diagnosis not present

## 2018-01-27 DIAGNOSIS — I120 Hypertensive chronic kidney disease with stage 5 chronic kidney disease or end stage renal disease: Secondary | ICD-10-CM | POA: Diagnosis not present

## 2018-01-27 DIAGNOSIS — E1122 Type 2 diabetes mellitus with diabetic chronic kidney disease: Secondary | ICD-10-CM | POA: Insufficient documentation

## 2018-01-27 DIAGNOSIS — M869 Osteomyelitis, unspecified: Secondary | ICD-10-CM | POA: Diagnosis present

## 2018-01-27 DIAGNOSIS — E1069 Type 1 diabetes mellitus with other specified complication: Secondary | ICD-10-CM

## 2018-01-27 DIAGNOSIS — M868X7 Other osteomyelitis, ankle and foot: Secondary | ICD-10-CM | POA: Insufficient documentation

## 2018-01-27 DIAGNOSIS — Z87891 Personal history of nicotine dependence: Secondary | ICD-10-CM | POA: Diagnosis not present

## 2018-01-27 DIAGNOSIS — Z89512 Acquired absence of left leg below knee: Secondary | ICD-10-CM | POA: Diagnosis present

## 2018-01-27 DIAGNOSIS — N2581 Secondary hyperparathyroidism of renal origin: Secondary | ICD-10-CM | POA: Diagnosis not present

## 2018-01-27 DIAGNOSIS — Z880 Allergy status to penicillin: Secondary | ICD-10-CM | POA: Insufficient documentation

## 2018-01-27 DIAGNOSIS — L97528 Non-pressure chronic ulcer of other part of left foot with other specified severity: Secondary | ICD-10-CM | POA: Diagnosis not present

## 2018-01-27 DIAGNOSIS — M86671 Other chronic osteomyelitis, right ankle and foot: Secondary | ICD-10-CM | POA: Diagnosis not present

## 2018-01-27 DIAGNOSIS — E1142 Type 2 diabetes mellitus with diabetic polyneuropathy: Secondary | ICD-10-CM | POA: Diagnosis not present

## 2018-01-27 DIAGNOSIS — Z992 Dependence on renal dialysis: Secondary | ICD-10-CM | POA: Diagnosis not present

## 2018-01-27 DIAGNOSIS — R9389 Abnormal findings on diagnostic imaging of other specified body structures: Secondary | ICD-10-CM

## 2018-01-27 DIAGNOSIS — L97426 Non-pressure chronic ulcer of left heel and midfoot with bone involvement without evidence of necrosis: Secondary | ICD-10-CM | POA: Diagnosis not present

## 2018-01-27 DIAGNOSIS — Z794 Long term (current) use of insulin: Secondary | ICD-10-CM | POA: Insufficient documentation

## 2018-01-27 DIAGNOSIS — E1151 Type 2 diabetes mellitus with diabetic peripheral angiopathy without gangrene: Secondary | ICD-10-CM | POA: Diagnosis not present

## 2018-01-27 DIAGNOSIS — I471 Supraventricular tachycardia: Secondary | ICD-10-CM | POA: Diagnosis not present

## 2018-01-27 DIAGNOSIS — E1121 Type 2 diabetes mellitus with diabetic nephropathy: Secondary | ICD-10-CM | POA: Diagnosis not present

## 2018-01-27 DIAGNOSIS — M86672 Other chronic osteomyelitis, left ankle and foot: Secondary | ICD-10-CM | POA: Diagnosis not present

## 2018-01-27 HISTORY — PX: AMPUTATION TOE: SHX6595

## 2018-01-27 HISTORY — DX: Unspecified cataract: H26.9

## 2018-01-27 HISTORY — DX: Unspecified glaucoma: H40.9

## 2018-01-27 LAB — BASIC METABOLIC PANEL
Anion gap: 13 (ref 5–15)
BUN: 20 mg/dL (ref 6–20)
CHLORIDE: 94 mmol/L — AB (ref 101–111)
CO2: 31 mmol/L (ref 22–32)
CREATININE: 5.92 mg/dL — AB (ref 0.61–1.24)
Calcium: 8.6 mg/dL — ABNORMAL LOW (ref 8.9–10.3)
GFR calc Af Amer: 10 mL/min — ABNORMAL LOW (ref >60)
GFR, EST NON AFRICAN AMERICAN: 9 mL/min — AB (ref >60)
GLUCOSE: 126 mg/dL — AB (ref 65–99)
POTASSIUM: 3.9 mmol/L (ref 3.5–5.1)
SODIUM: 138 mmol/L (ref 135–145)

## 2018-01-27 LAB — CBC
HEMATOCRIT: 40.9 % (ref 39.0–52.0)
Hemoglobin: 13.3 g/dL (ref 13.0–17.0)
MCH: 30.7 pg (ref 26.0–34.0)
MCHC: 32.5 g/dL (ref 30.0–36.0)
MCV: 94.5 fL (ref 78.0–100.0)
PLATELETS: 247 10*3/uL (ref 150–400)
RBC: 4.33 MIL/uL (ref 4.22–5.81)
RDW: 14.6 % (ref 11.5–15.5)
WBC: 8 10*3/uL (ref 4.0–10.5)

## 2018-01-27 LAB — GLUCOSE, CAPILLARY
GLUCOSE-CAPILLARY: 103 mg/dL — AB (ref 65–99)
Glucose-Capillary: 101 mg/dL — ABNORMAL HIGH (ref 65–99)

## 2018-01-27 SURGERY — AMPUTATION, TOE
Anesthesia: General | Site: Foot | Laterality: Bilateral

## 2018-01-27 MED ORDER — LIDOCAINE 2% (20 MG/ML) 5 ML SYRINGE
INTRAMUSCULAR | Status: DC | PRN
  Administered 2018-01-27: 40 mg via INTRAVENOUS

## 2018-01-27 MED ORDER — CHLORHEXIDINE GLUCONATE 4 % EX LIQD: 60.0000 mL | Freq: Once | CUTANEOUS | Status: DC

## 2018-01-27 MED ORDER — FENTANYL CITRATE (PF) 250 MCG/5ML IJ SOLN
INTRAMUSCULAR | Status: AC
  Filled 2018-01-27: qty 5

## 2018-01-27 MED ORDER — PHENYLEPHRINE 40 MCG/ML (10ML) SYRINGE FOR IV PUSH (FOR BLOOD PRESSURE SUPPORT)
PREFILLED_SYRINGE | INTRAVENOUS | Status: DC | PRN
  Administered 2018-01-27 (×6): 80 ug via INTRAVENOUS

## 2018-01-27 MED ORDER — MIDAZOLAM HCL 2 MG/2ML IJ SOLN
INTRAMUSCULAR | Status: AC
  Filled 2018-01-27: qty 2

## 2018-01-27 MED ORDER — FENTANYL CITRATE (PF) 250 MCG/5ML IJ SOLN
INTRAMUSCULAR | Status: DC | PRN
  Administered 2018-01-27: 100 ug via INTRAVENOUS

## 2018-01-27 MED ORDER — PROPOFOL 10 MG/ML IV BOLUS
INTRAVENOUS | Status: AC
  Filled 2018-01-27: qty 20

## 2018-01-27 MED ORDER — VANCOMYCIN HCL IN DEXTROSE 1-5 GM/200ML-% IV SOLN: 1000.0000 mg | INTRAVENOUS | Status: DC

## 2018-01-27 MED ORDER — 0.9 % SODIUM CHLORIDE (POUR BTL) OPTIME
TOPICAL | Status: DC | PRN
  Administered 2018-01-27: 1000 mL

## 2018-01-27 MED ORDER — SODIUM CHLORIDE 0.9 % IV SOLN
INTRAVENOUS | Status: DC
  Administered 2018-01-27 (×2): via INTRAVENOUS

## 2018-01-27 MED ORDER — FENTANYL CITRATE (PF) 100 MCG/2ML IJ SOLN: 25.0000 ug | INTRAMUSCULAR | Status: DC | PRN

## 2018-01-27 MED ORDER — OXYCODONE HCL 5 MG PO TABS: 5.0000 mg | ORAL_TABLET | Freq: Once | ORAL | Status: DC | PRN

## 2018-01-27 MED ORDER — HYDROCODONE-ACETAMINOPHEN 5-325 MG PO TABS: 1.0000 | ORAL_TABLET | Freq: Four times a day (QID) | ORAL | 0 refills | Status: DC | PRN

## 2018-01-27 MED ORDER — PROPOFOL 10 MG/ML IV BOLUS
INTRAVENOUS | Status: DC | PRN
  Administered 2018-01-27: 60 mg via INTRAVENOUS
  Administered 2018-01-27 (×2): 20 mg via INTRAVENOUS

## 2018-01-27 MED ORDER — ONDANSETRON HCL 4 MG/2ML IJ SOLN
INTRAMUSCULAR | Status: DC | PRN
  Administered 2018-01-27: 4 mg via INTRAVENOUS

## 2018-01-27 MED ORDER — OXYCODONE HCL 5 MG/5ML PO SOLN
5.0000 mg | Freq: Once | ORAL | Status: DC | PRN
  Filled 2018-01-27: qty 5

## 2018-01-27 MED ORDER — CLINDAMYCIN PHOSPHATE 900 MG/50ML IV SOLN
900.0000 mg | Freq: Once | INTRAVENOUS | Status: AC
  Administered 2018-01-27: 900 mg via INTRAVENOUS
  Filled 2018-01-27: qty 50

## 2018-01-27 MED ORDER — MIDAZOLAM HCL 2 MG/2ML IJ SOLN
INTRAMUSCULAR | Status: DC | PRN
  Administered 2018-01-27 (×3): 1 mg via INTRAVENOUS

## 2018-01-27 SURGICAL SUPPLY — 27 items
BAG ZIPLOCK 12X15 (MISCELLANEOUS) ×3 IMPLANT
BANDAGE ACE 4X5 VEL STRL LF (GAUZE/BANDAGES/DRESSINGS) ×6 IMPLANT
BLADE SURG SZ10 CARB STEEL (BLADE) ×6 IMPLANT
BNDG GAUZE ELAST 4 BULKY (GAUZE/BANDAGES/DRESSINGS) ×6 IMPLANT
COVER SURGICAL LIGHT HANDLE (MISCELLANEOUS) ×3 IMPLANT
CUFF TOURN SGL QUICK 18 (TOURNIQUET CUFF) ×6 IMPLANT
DRAPE EXTREMITY BILATERAL (DRAPES) ×3 IMPLANT
DRAPE SURG 17X11 SM STRL (DRAPES) ×3 IMPLANT
ELECT REM PT RETURN 15FT ADLT (MISCELLANEOUS) ×3 IMPLANT
GAUZE SPONGE 4X4 12PLY STRL (GAUZE/BANDAGES/DRESSINGS) ×3 IMPLANT
GAUZE XEROFORM 5X9 LF (GAUZE/BANDAGES/DRESSINGS) ×6 IMPLANT
GLOVE BIO SURGEON STRL SZ8 (GLOVE) ×3 IMPLANT
GLOVE ECLIPSE 7.5 STRL STRAW (GLOVE) ×3 IMPLANT
KIT BASIN OR (CUSTOM PROCEDURE TRAY) ×3 IMPLANT
MANIFOLD NEPTUNE II (INSTRUMENTS) ×3 IMPLANT
NS IRRIG 1000ML POUR BTL (IV SOLUTION) ×3 IMPLANT
PACK ORTHO EXTREMITY (CUSTOM PROCEDURE TRAY) ×3 IMPLANT
PAD ABD 7.5X8 STRL (GAUZE/BANDAGES/DRESSINGS) ×6 IMPLANT
PAD CAST 4YDX4 CTTN HI CHSV (CAST SUPPLIES) ×1 IMPLANT
PADDING CAST COTTON 4X4 STRL (CAST SUPPLIES) ×2
POSITIONER SURGICAL ARM (MISCELLANEOUS) ×3 IMPLANT
SLEEVE STOCKINETTE LIMB 6X9 (MISCELLANEOUS) ×3 IMPLANT
SOL PREP POV-IOD 4OZ 10% (MISCELLANEOUS) ×3 IMPLANT
SOL PREP PROV IODINE SCRUB 4OZ (MISCELLANEOUS) ×3 IMPLANT
SUT ETHILON 2 0 PS N (SUTURE) ×6 IMPLANT
SUT PROLENE 3 0 PS 2 (SUTURE) ×3 IMPLANT
WATER STERILE IRR 1000ML POUR (IV SOLUTION) ×3 IMPLANT

## 2018-01-27 NOTE — Anesthesia Postprocedure Evaluation (Signed)
Anesthesia Post Note  Patient: [name] DEHN Sr.  Procedure(s) Performed: RIGHT FOOT 3RD TOE AMPUTATION WITH INCISIONAL WOUND DEBRIDEMENT. LEFT FOOT 2ND TOE AMPUTATION AND INCISIONAL WOUND DEBRIDEMENT. (Bilateral Foot)     Patient location during evaluation: PACU Anesthesia Type: General Level of consciousness: awake and alert Pain management: pain level controlled Vital Signs Assessment: post-procedure vital signs reviewed and stable Respiratory status: spontaneous breathing, nonlabored ventilation, respiratory function stable and patient connected to nasal cannula oxygen Cardiovascular status: blood pressure returned to baseline and stable Postop Assessment: no apparent nausea or vomiting Anesthetic complications: no    Last Vitals:  Vitals:   01/27/18 1346 01/27/18 1411  BP: (!) 141/69 (!) 164/80  Pulse: 73 76  Resp: 12 16  Temp: (!) 36.2 C   SpO2: 100% 96%    Last Pain:  Vitals:   01/27/18 1300  TempSrc:   PainSc: 0-No pain                 Versia Mignogna

## 2018-01-27 NOTE — Anesthesia Procedure Notes (Signed)
Procedure Name: LMA Insertion Date/Time: 01/27/2018 12:19 PM Performed by: Cynda Familia, CRNA Pre-anesthesia Checklist: Patient identified, Emergency Drugs available, Suction available and Patient being monitored Patient Re-evaluated:Patient Re-evaluated prior to induction Oxygen Delivery Method: Circle System Utilized Preoxygenation: Pre-oxygenation with 100% oxygen Induction Type: IV induction Ventilation: Mask ventilation without difficulty LMA: LMA inserted LMA Size: 4.0 Number of attempts: 1 Placement Confirmation: positive ETCO2 Tube secured with: Tape Dental Injury: Teeth and Oropharynx as per pre-operative assessment  Comments: Smooth IV induction -- LMA atrauamtic--- teeth and mouth as preop-- bilat BS--- many missing , chipped broken teeth --- all unchanged after LMA insertion

## 2018-01-27 NOTE — Discharge Instructions (Signed)
°  Post Anesthesia Home Care Instructions  Activity: Get plenty of rest for the remainder of the day. A responsible individual must stay with you for 24 hours following the procedure.  For the next 24 hours, DO NOT: -Drive a car -Paediatric nurse -Drink alcoholic beverages -Take any medication unless instructed by your physician -Make any legal decisions or sign important papers.  Meals: Start with liquid foods such as gelatin or soup. Progress to regular foods as tolerated. Avoid greasy, spicy, heavy foods. If nausea and/or vomiting occur, drink only clear liquids until the nausea and/or vomiting subsides. Call your physician if vomiting continues.  Special Instructions/Symptoms: Your throat may feel dry or sore from the anesthesia or the breathing tube placed in your throat during surgery. If this causes discomfort, gargle with warm salt water. The discomfort should disappear within 24 hours.    Elevate your feet for a few days. Ambulate weightbearing as tolerated bilaterally with postop shoes.  Put weight on your heels. Keep dressings dry.

## 2018-01-27 NOTE — Brief Op Note (Signed)
01/27/2018  12:55 PM  PATIENT:  [name] Sages Sr.  67 y.o. male  PRE-OPERATIVE DIAGNOSIS:  RIGHT FOOT OSTEOMYELITIS THIRD TOE AND LEFT FOOT OSTEOMYELITIS OF SECOND TOE  POST-OPERATIVE DIAGNOSIS:  RIGHT FOOT OSTEOMYELITIS THIRD TOE AND LEFT FOOT OSTEOMYELITIS OF SECOND TOE  PROCEDURE:  Procedure(s): RIGHT FOOT 3RD TOE AMPUTATION WITH INCISIONAL WOUND DEBRIDEMENT. LEFT FOOT 2ND TOE AMPUTATION AND INCISIONAL WOUND DEBRIDEMENT. (Bilateral)  SURGEON:  Surgeon(s) and Role:    Dorna Leitz, MD - Primary  PHYSICIAN ASSISTANT:   ASSISTANTS: bethune   ANESTHESIA:   general  EBL:  5 mL   BLOOD ADMINISTERED:none  DRAINS: none   LOCAL MEDICATIONS USED:  NONE  SPECIMEN:  No Specimen  DISPOSITION OF SPECIMEN:  N/A  COUNTS:  YES  TOURNIQUET:  * Missing tourniquet times found for documented tourniquets in log: [id number] *  DICTATION: .Other Dictation: Dictation Number [id number]  PLAN OF CARE: Discharge to home after PACU  PATIENT DISPOSITION:  PACU - hemodynamically stable.   Delay start of Pharmacological VTE agent (>24hrs) due to surgical blood loss or risk of bleeding: no

## 2018-01-27 NOTE — Anesthesia Preprocedure Evaluation (Addendum)
Anesthesia Evaluation  Patient identified by MRN, date of birth, ID band Patient awake    Reviewed: Allergy & Precautions, NPO status , Patient's Chart, lab work & pertinent test results, reviewed documented beta blocker date and time   History of Anesthesia Complications Negative for: history of anesthetic complications  Airway Mallampati: II  TM Distance: >3 FB Neck ROM: Full    Dental  (+) Poor Dentition, Chipped, Missing, Dental Advisory Given   Pulmonary neg shortness of breath, neg sleep apnea, neg COPD, neg recent URI, former smoker,    breath sounds clear to auscultation       Cardiovascular hypertension, Pt. on medications and Pt. on home beta blockers (-) angina+ Peripheral Vascular Disease  (-) Past MI and (-) CHF  Rhythm:Regular     Neuro/Psych  Headaches, neg Seizures  Neuromuscular disease negative psych ROS   GI/Hepatic negative GI ROS, Neg liver ROS,   Endo/Other  diabetes, Type 2, Insulin Dependent  Renal/GU ESRF and DialysisRenal diseaseHD this morning      Musculoskeletal   Abdominal   Peds  Hematology  (+) anemia ,   Anesthesia Other Findings   Reproductive/Obstetrics                            Anesthesia Physical Anesthesia Plan  ASA: III  Anesthesia Plan: General   Post-op Pain Management:    Induction: Intravenous  PONV Risk Score and Plan: 2 and Ondansetron and Treatment may vary due to age or medical condition  Airway Management Planned: LMA  Additional Equipment: None  Intra-op Plan:   Post-operative Plan: Extubation in OR  Informed Consent: I have reviewed the patients History and Physical, chart, labs and discussed the procedure including the risks, benefits and alternatives for the proposed anesthesia with the patient or authorized representative who has indicated his/her understanding and acceptance.   Dental advisory given  Plan Discussed with:  CRNA and Surgeon  Anesthesia Plan Comments:         Anesthesia Quick Evaluation

## 2018-01-27 NOTE — Transfer of Care (Signed)
Immediate Anesthesia Transfer of Care Note  Patient: [name] SCHUH Sr.  Procedure(s) Performed: RIGHT FOOT 3RD TOE AMPUTATION WITH INCISIONAL WOUND DEBRIDEMENT. LEFT FOOT 2ND TOE AMPUTATION AND INCISIONAL WOUND DEBRIDEMENT. (Bilateral Foot)  Patient Location: PACU  Anesthesia Type:General  Level of Consciousness: sedated  Airway & Oxygen Therapy: Patient Spontanous Breathing and Patient connected to face mask oxygen  Post-op Assessment: Report given to RN and Post -op Vital signs reviewed and stable  Post vital signs: Reviewed and stable  Last Vitals:  Vitals:   01/27/18 1032  BP: 120/70  Pulse: 90  Resp: 18  Temp: 36.9 C  SpO2: 100%    Last Pain:  Vitals:   01/27/18 1032  TempSrc: Oral         Complications: No apparent anesthesia complications

## 2018-01-27 NOTE — Anesthesia Procedure Notes (Signed)
Date/Time: 01/27/2018 12:52 PM Performed by: Cynda Familia, CRNA Oxygen Delivery Method: Simple face mask Placement Confirmation: positive ETCO2 and breath sounds checked- equal and bilateral Dental Injury: Teeth and Oropharynx as per pre-operative assessment

## 2018-01-30 DIAGNOSIS — Z992 Dependence on renal dialysis: Secondary | ICD-10-CM | POA: Diagnosis not present

## 2018-01-30 DIAGNOSIS — E1121 Type 2 diabetes mellitus with diabetic nephropathy: Secondary | ICD-10-CM | POA: Diagnosis not present

## 2018-01-30 DIAGNOSIS — N2581 Secondary hyperparathyroidism of renal origin: Secondary | ICD-10-CM | POA: Diagnosis not present

## 2018-01-30 DIAGNOSIS — N186 End stage renal disease: Secondary | ICD-10-CM | POA: Diagnosis not present

## 2018-01-30 DIAGNOSIS — M869 Osteomyelitis, unspecified: Secondary | ICD-10-CM | POA: Diagnosis not present

## 2018-01-30 DIAGNOSIS — M868X7 Other osteomyelitis, ankle and foot: Secondary | ICD-10-CM | POA: Diagnosis not present

## 2018-01-30 NOTE — Op Note (Signed)
NAME:  [name], [name]                    ACCOUNT NO.:  MEDICAL RECORD NO.:  [id number]  LOCATION:                                 FACILITY:  PHYSICIAN:  [name], M.D.        DATE OF BIRTH:  DATE OF PROCEDURE:  01/27/2018 DATE OF DISCHARGE:                              OPERATIVE REPORT   PREOPERATIVE DIAGNOSES: 1. Osteomyelitis of the right 3rd toe. 2. Complex wound on the plantar aspect of the right foot. 3. Left foot osteomyelitis of the distal phalanx of the 2nd toe. 4. Complex wound over the lateral aspect of the left foot.  POSTOPERATIVE DIAGNOSES: 1. Osteomyelitis of the right 3rd toe. 2. Complex wound on the plantar aspect of the right foot. 3. Left foot osteomyelitis of the distal phalanx of the 2nd toe. 4. Complex wound over the lateral aspect of the left foot.  PROCEDURES: 1. 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 2. Excisional debridement of plantar wound with scalpel, rongeur,     pickups, and knife. 3. Amputation of the left 2nd toe at the interphalangeal joint. 4. Excisional debridement of skin, subcutaneous tissue, and bone at     the site of a complex wound, left foot.  SURGEON:  [name], MD.  ASSISTANT:  [name] Fleet, PA.  ANESTHESIA:  General.  BRIEF HISTORY:  [name] is a 67 year old male with long history of significant complaints of bilateral foot drainage and complex wounds. He had been followed by [place] for long time.  He has been followed at the [facility] for a long time and they ultimately sent him back to [place] for evaluation and treatment for osteomyelitis which had developed in both of his feet.  We had a long discussion about treatment options.  We felt that it was most appropriate course of action to try to avoid additional need for wound clinic visits and abnormality.  He is brought to the operating room for this procedure.  DESCRIPTION OF PROCEDURE:  The patient was brought to the operative room and after  adequate anesthesia was obtained with general anesthetic, the patient was placed supine on the operating table.  Both feet were then prepped and draped in usual sterile fashion.  Following this, attention was turned to the right foot where the 3rd toe was identified and a racket-style incision was made at the level of the mid phalanx of the 3rd toe.  Dissection was carried down to the metatarsophalangeal joint. The toe was completely excised and the wound was irrigated and closed in layers.  Attention was turned to the plantar aspect of the foot where a knife, scissors, rongeur, pickups were used to debride skin, subcutaneous tissue, and fascia.  Following this, attention was turned to the left leg where an incision was made in a racket style over the middle phalanx.  The subcutaneous tissue was taken down to the level of the interphalangeal joint and the joint was removed with the distal open wound.  At this point, the wound was irrigated, suctioned dry, closed with interrupted nylon sutures.  Attention was then turned to the lateral aspect of the left foot  where a complex wound was identified. We used a combination of a scalpel, scissors, rongeur, pickups to help debride skin, subcutaneous tissue, and bone over the lateral aspect of the foot.  Once this was done, the wound was irrigated thoroughly and closed with a single nylon stitch.  Sterile compressive dressing was applied to both feet at this point, and the patient was taken to the recovery room where she was noted to be in satisfactory condition. Estimated blood loss for the procedure was minimal.     [name], M.D.     Corliss Skains  D:  01/27/2018  T:  01/28/2018  Job:  [id number]

## 2018-01-31 ENCOUNTER — Encounter (HOSPITAL_BASED_OUTPATIENT_CLINIC_OR_DEPARTMENT_OTHER): Payer: Medicare Other | Attending: Internal Medicine

## 2018-01-31 DIAGNOSIS — L97512 Non-pressure chronic ulcer of other part of right foot with fat layer exposed: Secondary | ICD-10-CM | POA: Diagnosis not present

## 2018-01-31 DIAGNOSIS — E1169 Type 2 diabetes mellitus with other specified complication: Secondary | ICD-10-CM | POA: Diagnosis not present

## 2018-01-31 DIAGNOSIS — I120 Hypertensive chronic kidney disease with stage 5 chronic kidney disease or end stage renal disease: Secondary | ICD-10-CM | POA: Diagnosis not present

## 2018-01-31 DIAGNOSIS — E1122 Type 2 diabetes mellitus with diabetic chronic kidney disease: Secondary | ICD-10-CM | POA: Diagnosis not present

## 2018-01-31 DIAGNOSIS — Z89421 Acquired absence of other right toe(s): Secondary | ICD-10-CM | POA: Insufficient documentation

## 2018-01-31 DIAGNOSIS — E11621 Type 2 diabetes mellitus with foot ulcer: Secondary | ICD-10-CM | POA: Insufficient documentation

## 2018-01-31 DIAGNOSIS — N185 Chronic kidney disease, stage 5: Secondary | ICD-10-CM | POA: Diagnosis not present

## 2018-01-31 DIAGNOSIS — L97526 Non-pressure chronic ulcer of other part of left foot with bone involvement without evidence of necrosis: Secondary | ICD-10-CM | POA: Insufficient documentation

## 2018-01-31 DIAGNOSIS — M868X7 Other osteomyelitis, ankle and foot: Secondary | ICD-10-CM | POA: Diagnosis not present

## 2018-01-31 DIAGNOSIS — E1140 Type 2 diabetes mellitus with diabetic neuropathy, unspecified: Secondary | ICD-10-CM | POA: Insufficient documentation

## 2018-01-31 DIAGNOSIS — Z89422 Acquired absence of other left toe(s): Secondary | ICD-10-CM | POA: Diagnosis not present

## 2018-01-31 DIAGNOSIS — Z992 Dependence on renal dialysis: Secondary | ICD-10-CM | POA: Insufficient documentation

## 2018-01-31 DIAGNOSIS — L97522 Non-pressure chronic ulcer of other part of left foot with fat layer exposed: Secondary | ICD-10-CM | POA: Diagnosis not present

## 2018-02-01 DIAGNOSIS — Z992 Dependence on renal dialysis: Secondary | ICD-10-CM | POA: Diagnosis not present

## 2018-02-01 DIAGNOSIS — N2581 Secondary hyperparathyroidism of renal origin: Secondary | ICD-10-CM | POA: Diagnosis not present

## 2018-02-01 DIAGNOSIS — E1121 Type 2 diabetes mellitus with diabetic nephropathy: Secondary | ICD-10-CM | POA: Diagnosis not present

## 2018-02-01 DIAGNOSIS — M869 Osteomyelitis, unspecified: Secondary | ICD-10-CM | POA: Diagnosis not present

## 2018-02-01 DIAGNOSIS — N186 End stage renal disease: Secondary | ICD-10-CM | POA: Diagnosis not present

## 2018-02-01 DIAGNOSIS — M868X7 Other osteomyelitis, ankle and foot: Secondary | ICD-10-CM | POA: Diagnosis not present

## 2018-02-02 ENCOUNTER — Encounter: Payer: Self-pay | Admitting: Infectious Diseases

## 2018-02-02 ENCOUNTER — Telehealth: Payer: Self-pay

## 2018-02-02 ENCOUNTER — Ambulatory Visit (INDEPENDENT_AMBULATORY_CARE_PROVIDER_SITE_OTHER): Payer: Medicare Other | Admitting: Infectious Diseases

## 2018-02-02 DIAGNOSIS — N186 End stage renal disease: Secondary | ICD-10-CM | POA: Diagnosis not present

## 2018-02-02 DIAGNOSIS — Z992 Dependence on renal dialysis: Secondary | ICD-10-CM | POA: Diagnosis not present

## 2018-02-02 DIAGNOSIS — E1121 Type 2 diabetes mellitus with diabetic nephropathy: Secondary | ICD-10-CM

## 2018-02-02 DIAGNOSIS — M869 Osteomyelitis, unspecified: Secondary | ICD-10-CM | POA: Diagnosis not present

## 2018-02-02 DIAGNOSIS — M86572 Other chronic hematogenous osteomyelitis, left ankle and foot: Secondary | ICD-10-CM

## 2018-02-02 DIAGNOSIS — M86271 Subacute osteomyelitis, right ankle and foot: Secondary | ICD-10-CM | POA: Diagnosis not present

## 2018-02-02 NOTE — Assessment & Plan Note (Signed)
will continue his anbx for 6 weeks post op Will see him back in 4 weeks.

## 2018-02-02 NOTE — Progress Notes (Signed)
   Subjective:    Patient ID: [name] BISE Sr., male    DOB: 03-10-1951, 67 y.o.   MRN: [id number]  HPI 67 yo M with hx of DM2, ESRD, CAD, and ulcers on R first metatarsal head; L 5th and L 1st MT heads.  He was seen in [facility] on 5-21-18with worsening of his R 5th ray and was sent to hospital. His wound Cx was polymicrobial. He underwent R 5th ray amputation.   He was restarted on his anbx at [facility] (vanco/ceftaz/flagyl), completed 6 weeks on 08-15-17. 2018He was able to start wearing shoes again in November.   Since Dec he has had slight increase in size of his L lateral foot. He has also developed an ulcer under his R 2nd MT head. He was seen in [facility] and had MRI which showed osteo of base of his L foot. He was started on bactrim --> levaquin. He had wound Cx done which showed- MSSA and Strep agalacticae. Since his toe nail on his R 3rd toe fell off and he could probe to bone.   MRI (01-14-18) IMPRESSION: 1. Interval development of abnormal signal from the bases of the third and fourth metatarsals and in the cuboid remnant consistent with osteomyelitis. Adjacent soft tissue ulceration and small abscess. 2. New arthritic changes between the navicular and medial cuneiform, at the second tarsal metatarsal joint, and new [place] infraction of the head of the second metatarsal.   On 2-1 he underwent  1. 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 2. Excisional debridement of plantar wound. 3. Amputation of the left 2nd toe at the interphalangeal joint. 4. Excisional debridement of skin, subcutaneous tissue, and bone at     the site of a complex wound, left foot. by [name].   Feels like his wounds have been healing well.  He has continued on his anbx at [facility].  No f/c. Can ambulate well.  Had [facility] f/u on 2-5.  He has continued to f/u with [name] for his ESRD.  His FSG have been "pretty steady" 87-140.  Review of Systems  Constitutional: Negative for appetite  change, chills, fever and unexpected weight change.  Gastrointestinal: Negative for constipation and diarrhea.  Genitourinary: Negative for difficulty urinating.  Neurological: Positive for numbness.  Please see HPI. All other systems reviewed and negative.     Objective:   Physical Exam  Constitutional: He appears well-developed and well-nourished.  HENT:  Mouth/Throat: Abnormal dentition. Dental caries present. No oropharyngeal exudate.  Eyes: EOM are normal. Pupils are equal, round, and reactive to light.  Neck: Neck supple.  Cardiovascular: Normal rate, regular rhythm and normal heart sounds.  Pulmonary/Chest: Effort normal and breath sounds normal.  Abdominal: Soft. Bowel sounds are normal. There is no tenderness. There is no rebound.  Lymphadenopathy:    He has no cervical adenopathy.       His wounds are clean. Non-tender, no d/c.  His lateral L wound is small, less than 31mm.  His toe wounds are clean.  His R planter wound is clean.        Assessment & Plan:

## 2018-02-02 NOTE — Assessment & Plan Note (Addendum)
will continue his anbx for 6 weeks post op Will see him back in 4 weeks.

## 2018-02-02 NOTE — Assessment & Plan Note (Signed)
He will continue to f/u with Dr Ambrose Pancoast

## 2018-02-02 NOTE — Telephone Encounter (Signed)
Per Dr. [name] I called [facility] to inform them that the pt would continue his antibiotics for 6 more weeks. I spoke with Levada Dy, RN who was able to take my message before ending the call. [place]

## 2018-02-02 NOTE — Assessment & Plan Note (Signed)
wil lcontinue his anbx for 6 weeks post op Will see him back in 4 weeks.

## 2018-02-02 NOTE — Assessment & Plan Note (Signed)
He appears to be well controlled by his hx.

## 2018-02-03 DIAGNOSIS — N2581 Secondary hyperparathyroidism of renal origin: Secondary | ICD-10-CM | POA: Diagnosis not present

## 2018-02-03 DIAGNOSIS — M869 Osteomyelitis, unspecified: Secondary | ICD-10-CM | POA: Diagnosis not present

## 2018-02-03 DIAGNOSIS — E1121 Type 2 diabetes mellitus with diabetic nephropathy: Secondary | ICD-10-CM | POA: Diagnosis not present

## 2018-02-03 DIAGNOSIS — M868X7 Other osteomyelitis, ankle and foot: Secondary | ICD-10-CM | POA: Diagnosis not present

## 2018-02-03 DIAGNOSIS — N186 End stage renal disease: Secondary | ICD-10-CM | POA: Diagnosis not present

## 2018-02-03 DIAGNOSIS — Z992 Dependence on renal dialysis: Secondary | ICD-10-CM | POA: Diagnosis not present

## 2018-02-06 DIAGNOSIS — Z992 Dependence on renal dialysis: Secondary | ICD-10-CM | POA: Diagnosis not present

## 2018-02-06 DIAGNOSIS — M868X7 Other osteomyelitis, ankle and foot: Secondary | ICD-10-CM | POA: Diagnosis not present

## 2018-02-06 DIAGNOSIS — M869 Osteomyelitis, unspecified: Secondary | ICD-10-CM | POA: Diagnosis not present

## 2018-02-06 DIAGNOSIS — E1121 Type 2 diabetes mellitus with diabetic nephropathy: Secondary | ICD-10-CM | POA: Diagnosis not present

## 2018-02-06 DIAGNOSIS — N2581 Secondary hyperparathyroidism of renal origin: Secondary | ICD-10-CM | POA: Diagnosis not present

## 2018-02-06 DIAGNOSIS — N186 End stage renal disease: Secondary | ICD-10-CM | POA: Diagnosis not present

## 2018-02-07 DIAGNOSIS — E11621 Type 2 diabetes mellitus with foot ulcer: Secondary | ICD-10-CM | POA: Diagnosis not present

## 2018-02-08 DIAGNOSIS — N2581 Secondary hyperparathyroidism of renal origin: Secondary | ICD-10-CM | POA: Diagnosis not present

## 2018-02-08 DIAGNOSIS — M869 Osteomyelitis, unspecified: Secondary | ICD-10-CM | POA: Diagnosis not present

## 2018-02-08 DIAGNOSIS — N186 End stage renal disease: Secondary | ICD-10-CM | POA: Diagnosis not present

## 2018-02-08 DIAGNOSIS — M868X7 Other osteomyelitis, ankle and foot: Secondary | ICD-10-CM | POA: Diagnosis not present

## 2018-02-08 DIAGNOSIS — E1121 Type 2 diabetes mellitus with diabetic nephropathy: Secondary | ICD-10-CM | POA: Diagnosis not present

## 2018-02-08 DIAGNOSIS — Z992 Dependence on renal dialysis: Secondary | ICD-10-CM | POA: Diagnosis not present

## 2018-02-09 DIAGNOSIS — L97526 Non-pressure chronic ulcer of other part of left foot with bone involvement without evidence of necrosis: Secondary | ICD-10-CM | POA: Diagnosis not present

## 2018-02-09 DIAGNOSIS — E1169 Type 2 diabetes mellitus with other specified complication: Secondary | ICD-10-CM | POA: Diagnosis not present

## 2018-02-09 DIAGNOSIS — I120 Hypertensive chronic kidney disease with stage 5 chronic kidney disease or end stage renal disease: Secondary | ICD-10-CM | POA: Diagnosis not present

## 2018-02-09 DIAGNOSIS — M8668 Other chronic osteomyelitis, other site: Secondary | ICD-10-CM | POA: Diagnosis not present

## 2018-02-09 DIAGNOSIS — E11621 Type 2 diabetes mellitus with foot ulcer: Secondary | ICD-10-CM | POA: Diagnosis not present

## 2018-02-09 DIAGNOSIS — L97512 Non-pressure chronic ulcer of other part of right foot with fat layer exposed: Secondary | ICD-10-CM | POA: Diagnosis not present

## 2018-02-09 DIAGNOSIS — M868X7 Other osteomyelitis, ankle and foot: Secondary | ICD-10-CM | POA: Diagnosis not present

## 2018-02-10 DIAGNOSIS — N186 End stage renal disease: Secondary | ICD-10-CM | POA: Diagnosis not present

## 2018-02-10 DIAGNOSIS — M869 Osteomyelitis, unspecified: Secondary | ICD-10-CM | POA: Diagnosis not present

## 2018-02-10 DIAGNOSIS — Z992 Dependence on renal dialysis: Secondary | ICD-10-CM | POA: Diagnosis not present

## 2018-02-10 DIAGNOSIS — M868X7 Other osteomyelitis, ankle and foot: Secondary | ICD-10-CM | POA: Diagnosis not present

## 2018-02-10 DIAGNOSIS — N2581 Secondary hyperparathyroidism of renal origin: Secondary | ICD-10-CM | POA: Diagnosis not present

## 2018-02-10 DIAGNOSIS — E1121 Type 2 diabetes mellitus with diabetic nephropathy: Secondary | ICD-10-CM | POA: Diagnosis not present

## 2018-02-13 DIAGNOSIS — E1121 Type 2 diabetes mellitus with diabetic nephropathy: Secondary | ICD-10-CM | POA: Diagnosis not present

## 2018-02-13 DIAGNOSIS — N2581 Secondary hyperparathyroidism of renal origin: Secondary | ICD-10-CM | POA: Diagnosis not present

## 2018-02-13 DIAGNOSIS — N186 End stage renal disease: Secondary | ICD-10-CM | POA: Diagnosis not present

## 2018-02-13 DIAGNOSIS — M868X7 Other osteomyelitis, ankle and foot: Secondary | ICD-10-CM | POA: Diagnosis not present

## 2018-02-13 DIAGNOSIS — Z9889 Other specified postprocedural states: Secondary | ICD-10-CM | POA: Diagnosis not present

## 2018-02-13 DIAGNOSIS — Z992 Dependence on renal dialysis: Secondary | ICD-10-CM | POA: Diagnosis not present

## 2018-02-13 DIAGNOSIS — M869 Osteomyelitis, unspecified: Secondary | ICD-10-CM | POA: Diagnosis not present

## 2018-02-14 DIAGNOSIS — E1169 Type 2 diabetes mellitus with other specified complication: Secondary | ICD-10-CM | POA: Diagnosis not present

## 2018-02-14 DIAGNOSIS — L97522 Non-pressure chronic ulcer of other part of left foot with fat layer exposed: Secondary | ICD-10-CM | POA: Diagnosis not present

## 2018-02-14 DIAGNOSIS — E11621 Type 2 diabetes mellitus with foot ulcer: Secondary | ICD-10-CM | POA: Diagnosis not present

## 2018-02-14 DIAGNOSIS — M868X7 Other osteomyelitis, ankle and foot: Secondary | ICD-10-CM | POA: Diagnosis not present

## 2018-02-14 DIAGNOSIS — I120 Hypertensive chronic kidney disease with stage 5 chronic kidney disease or end stage renal disease: Secondary | ICD-10-CM | POA: Diagnosis not present

## 2018-02-14 DIAGNOSIS — L97526 Non-pressure chronic ulcer of other part of left foot with bone involvement without evidence of necrosis: Secondary | ICD-10-CM | POA: Diagnosis not present

## 2018-02-14 DIAGNOSIS — L97512 Non-pressure chronic ulcer of other part of right foot with fat layer exposed: Secondary | ICD-10-CM | POA: Diagnosis not present

## 2018-02-15 DIAGNOSIS — E1121 Type 2 diabetes mellitus with diabetic nephropathy: Secondary | ICD-10-CM | POA: Diagnosis not present

## 2018-02-15 DIAGNOSIS — Z992 Dependence on renal dialysis: Secondary | ICD-10-CM | POA: Diagnosis not present

## 2018-02-15 DIAGNOSIS — M868X7 Other osteomyelitis, ankle and foot: Secondary | ICD-10-CM | POA: Diagnosis not present

## 2018-02-15 DIAGNOSIS — N2581 Secondary hyperparathyroidism of renal origin: Secondary | ICD-10-CM | POA: Diagnosis not present

## 2018-02-15 DIAGNOSIS — N186 End stage renal disease: Secondary | ICD-10-CM | POA: Diagnosis not present

## 2018-02-15 DIAGNOSIS — M869 Osteomyelitis, unspecified: Secondary | ICD-10-CM | POA: Diagnosis not present

## 2018-02-17 DIAGNOSIS — N186 End stage renal disease: Secondary | ICD-10-CM | POA: Diagnosis not present

## 2018-02-17 DIAGNOSIS — Z992 Dependence on renal dialysis: Secondary | ICD-10-CM | POA: Diagnosis not present

## 2018-02-17 DIAGNOSIS — E1121 Type 2 diabetes mellitus with diabetic nephropathy: Secondary | ICD-10-CM | POA: Diagnosis not present

## 2018-02-17 DIAGNOSIS — N2581 Secondary hyperparathyroidism of renal origin: Secondary | ICD-10-CM | POA: Diagnosis not present

## 2018-02-17 DIAGNOSIS — M869 Osteomyelitis, unspecified: Secondary | ICD-10-CM | POA: Diagnosis not present

## 2018-02-17 DIAGNOSIS — M868X7 Other osteomyelitis, ankle and foot: Secondary | ICD-10-CM | POA: Diagnosis not present

## 2018-02-20 DIAGNOSIS — Z992 Dependence on renal dialysis: Secondary | ICD-10-CM | POA: Diagnosis not present

## 2018-02-20 DIAGNOSIS — M868X7 Other osteomyelitis, ankle and foot: Secondary | ICD-10-CM | POA: Diagnosis not present

## 2018-02-20 DIAGNOSIS — E1121 Type 2 diabetes mellitus with diabetic nephropathy: Secondary | ICD-10-CM | POA: Diagnosis not present

## 2018-02-20 DIAGNOSIS — N186 End stage renal disease: Secondary | ICD-10-CM | POA: Diagnosis not present

## 2018-02-20 DIAGNOSIS — N2581 Secondary hyperparathyroidism of renal origin: Secondary | ICD-10-CM | POA: Diagnosis not present

## 2018-02-20 DIAGNOSIS — M869 Osteomyelitis, unspecified: Secondary | ICD-10-CM | POA: Diagnosis not present

## 2018-02-21 DIAGNOSIS — E1169 Type 2 diabetes mellitus with other specified complication: Secondary | ICD-10-CM | POA: Diagnosis not present

## 2018-02-21 DIAGNOSIS — I120 Hypertensive chronic kidney disease with stage 5 chronic kidney disease or end stage renal disease: Secondary | ICD-10-CM | POA: Diagnosis not present

## 2018-02-21 DIAGNOSIS — E11621 Type 2 diabetes mellitus with foot ulcer: Secondary | ICD-10-CM | POA: Diagnosis not present

## 2018-02-21 DIAGNOSIS — L97512 Non-pressure chronic ulcer of other part of right foot with fat layer exposed: Secondary | ICD-10-CM | POA: Diagnosis not present

## 2018-02-21 DIAGNOSIS — L97526 Non-pressure chronic ulcer of other part of left foot with bone involvement without evidence of necrosis: Secondary | ICD-10-CM | POA: Diagnosis not present

## 2018-02-21 DIAGNOSIS — M8668 Other chronic osteomyelitis, other site: Secondary | ICD-10-CM | POA: Diagnosis not present

## 2018-02-21 DIAGNOSIS — M868X7 Other osteomyelitis, ankle and foot: Secondary | ICD-10-CM | POA: Diagnosis not present

## 2018-02-21 DIAGNOSIS — L97522 Non-pressure chronic ulcer of other part of left foot with fat layer exposed: Secondary | ICD-10-CM | POA: Diagnosis not present

## 2018-02-22 DIAGNOSIS — N2581 Secondary hyperparathyroidism of renal origin: Secondary | ICD-10-CM | POA: Diagnosis not present

## 2018-02-22 DIAGNOSIS — N186 End stage renal disease: Secondary | ICD-10-CM | POA: Diagnosis not present

## 2018-02-22 DIAGNOSIS — M869 Osteomyelitis, unspecified: Secondary | ICD-10-CM | POA: Diagnosis not present

## 2018-02-22 DIAGNOSIS — M868X7 Other osteomyelitis, ankle and foot: Secondary | ICD-10-CM | POA: Diagnosis not present

## 2018-02-22 DIAGNOSIS — Z992 Dependence on renal dialysis: Secondary | ICD-10-CM | POA: Diagnosis not present

## 2018-02-22 DIAGNOSIS — E1121 Type 2 diabetes mellitus with diabetic nephropathy: Secondary | ICD-10-CM | POA: Diagnosis not present

## 2018-02-24 DIAGNOSIS — M868X7 Other osteomyelitis, ankle and foot: Secondary | ICD-10-CM | POA: Diagnosis not present

## 2018-02-24 DIAGNOSIS — I959 Hypotension, unspecified: Secondary | ICD-10-CM | POA: Diagnosis not present

## 2018-02-24 DIAGNOSIS — Z992 Dependence on renal dialysis: Secondary | ICD-10-CM | POA: Diagnosis not present

## 2018-02-24 DIAGNOSIS — E1122 Type 2 diabetes mellitus with diabetic chronic kidney disease: Secondary | ICD-10-CM | POA: Diagnosis not present

## 2018-02-24 DIAGNOSIS — N2581 Secondary hyperparathyroidism of renal origin: Secondary | ICD-10-CM | POA: Diagnosis not present

## 2018-02-24 DIAGNOSIS — E1121 Type 2 diabetes mellitus with diabetic nephropathy: Secondary | ICD-10-CM | POA: Diagnosis not present

## 2018-02-24 DIAGNOSIS — N186 End stage renal disease: Secondary | ICD-10-CM | POA: Diagnosis not present

## 2018-02-27 DIAGNOSIS — N2581 Secondary hyperparathyroidism of renal origin: Secondary | ICD-10-CM | POA: Diagnosis not present

## 2018-02-27 DIAGNOSIS — N186 End stage renal disease: Secondary | ICD-10-CM | POA: Diagnosis not present

## 2018-02-27 DIAGNOSIS — M868X7 Other osteomyelitis, ankle and foot: Secondary | ICD-10-CM | POA: Diagnosis not present

## 2018-02-27 DIAGNOSIS — Z992 Dependence on renal dialysis: Secondary | ICD-10-CM | POA: Diagnosis not present

## 2018-02-27 DIAGNOSIS — I959 Hypotension, unspecified: Secondary | ICD-10-CM | POA: Diagnosis not present

## 2018-02-27 DIAGNOSIS — E1121 Type 2 diabetes mellitus with diabetic nephropathy: Secondary | ICD-10-CM | POA: Diagnosis not present

## 2018-02-28 ENCOUNTER — Encounter (HOSPITAL_BASED_OUTPATIENT_CLINIC_OR_DEPARTMENT_OTHER): Payer: Medicare Other | Attending: Internal Medicine

## 2018-02-28 DIAGNOSIS — L97526 Non-pressure chronic ulcer of other part of left foot with bone involvement without evidence of necrosis: Secondary | ICD-10-CM | POA: Insufficient documentation

## 2018-02-28 DIAGNOSIS — L97512 Non-pressure chronic ulcer of other part of right foot with fat layer exposed: Secondary | ICD-10-CM | POA: Diagnosis not present

## 2018-02-28 DIAGNOSIS — L97522 Non-pressure chronic ulcer of other part of left foot with fat layer exposed: Secondary | ICD-10-CM | POA: Diagnosis not present

## 2018-02-28 DIAGNOSIS — I509 Heart failure, unspecified: Secondary | ICD-10-CM | POA: Insufficient documentation

## 2018-02-28 DIAGNOSIS — E1122 Type 2 diabetes mellitus with diabetic chronic kidney disease: Secondary | ICD-10-CM | POA: Insufficient documentation

## 2018-02-28 DIAGNOSIS — I130 Hypertensive heart and chronic kidney disease with heart failure and stage 1 through stage 4 chronic kidney disease, or unspecified chronic kidney disease: Secondary | ICD-10-CM | POA: Insufficient documentation

## 2018-02-28 DIAGNOSIS — E1140 Type 2 diabetes mellitus with diabetic neuropathy, unspecified: Secondary | ICD-10-CM | POA: Insufficient documentation

## 2018-02-28 DIAGNOSIS — E11621 Type 2 diabetes mellitus with foot ulcer: Secondary | ICD-10-CM | POA: Insufficient documentation

## 2018-02-28 DIAGNOSIS — L97429 Non-pressure chronic ulcer of left heel and midfoot with unspecified severity: Secondary | ICD-10-CM | POA: Insufficient documentation

## 2018-02-28 DIAGNOSIS — Z992 Dependence on renal dialysis: Secondary | ICD-10-CM | POA: Insufficient documentation

## 2018-02-28 DIAGNOSIS — N184 Chronic kidney disease, stage 4 (severe): Secondary | ICD-10-CM | POA: Insufficient documentation

## 2018-03-01 DIAGNOSIS — M868X7 Other osteomyelitis, ankle and foot: Secondary | ICD-10-CM | POA: Diagnosis not present

## 2018-03-01 DIAGNOSIS — Z992 Dependence on renal dialysis: Secondary | ICD-10-CM | POA: Diagnosis not present

## 2018-03-01 DIAGNOSIS — N2581 Secondary hyperparathyroidism of renal origin: Secondary | ICD-10-CM | POA: Diagnosis not present

## 2018-03-01 DIAGNOSIS — N186 End stage renal disease: Secondary | ICD-10-CM | POA: Diagnosis not present

## 2018-03-01 DIAGNOSIS — I959 Hypotension, unspecified: Secondary | ICD-10-CM | POA: Diagnosis not present

## 2018-03-01 DIAGNOSIS — E1121 Type 2 diabetes mellitus with diabetic nephropathy: Secondary | ICD-10-CM | POA: Diagnosis not present

## 2018-03-02 ENCOUNTER — Ambulatory Visit (HOSPITAL_COMMUNITY): Payer: Medicare Other | Attending: Cardiology

## 2018-03-02 ENCOUNTER — Other Ambulatory Visit: Payer: Self-pay

## 2018-03-02 DIAGNOSIS — I5030 Unspecified diastolic (congestive) heart failure: Secondary | ICD-10-CM | POA: Diagnosis not present

## 2018-03-02 DIAGNOSIS — I5021 Acute systolic (congestive) heart failure: Secondary | ICD-10-CM | POA: Diagnosis not present

## 2018-03-02 DIAGNOSIS — R0600 Dyspnea, unspecified: Secondary | ICD-10-CM | POA: Diagnosis not present

## 2018-03-02 DIAGNOSIS — I420 Dilated cardiomyopathy: Secondary | ICD-10-CM | POA: Diagnosis not present

## 2018-03-02 DIAGNOSIS — I517 Cardiomegaly: Secondary | ICD-10-CM | POA: Diagnosis not present

## 2018-03-02 DIAGNOSIS — I739 Peripheral vascular disease, unspecified: Secondary | ICD-10-CM | POA: Diagnosis not present

## 2018-03-03 DIAGNOSIS — N186 End stage renal disease: Secondary | ICD-10-CM | POA: Diagnosis not present

## 2018-03-03 DIAGNOSIS — M868X7 Other osteomyelitis, ankle and foot: Secondary | ICD-10-CM | POA: Diagnosis not present

## 2018-03-03 DIAGNOSIS — E1121 Type 2 diabetes mellitus with diabetic nephropathy: Secondary | ICD-10-CM | POA: Diagnosis not present

## 2018-03-03 DIAGNOSIS — N2581 Secondary hyperparathyroidism of renal origin: Secondary | ICD-10-CM | POA: Diagnosis not present

## 2018-03-03 DIAGNOSIS — Z992 Dependence on renal dialysis: Secondary | ICD-10-CM | POA: Diagnosis not present

## 2018-03-03 DIAGNOSIS — I959 Hypotension, unspecified: Secondary | ICD-10-CM | POA: Diagnosis not present

## 2018-03-06 DIAGNOSIS — I959 Hypotension, unspecified: Secondary | ICD-10-CM | POA: Diagnosis not present

## 2018-03-06 DIAGNOSIS — E1121 Type 2 diabetes mellitus with diabetic nephropathy: Secondary | ICD-10-CM | POA: Diagnosis not present

## 2018-03-06 DIAGNOSIS — Z992 Dependence on renal dialysis: Secondary | ICD-10-CM | POA: Diagnosis not present

## 2018-03-06 DIAGNOSIS — N186 End stage renal disease: Secondary | ICD-10-CM | POA: Diagnosis not present

## 2018-03-06 DIAGNOSIS — M868X7 Other osteomyelitis, ankle and foot: Secondary | ICD-10-CM | POA: Diagnosis not present

## 2018-03-06 DIAGNOSIS — N2581 Secondary hyperparathyroidism of renal origin: Secondary | ICD-10-CM | POA: Diagnosis not present

## 2018-03-07 ENCOUNTER — Other Ambulatory Visit (HOSPITAL_COMMUNITY)
Admission: RE | Admit: 2018-03-07 | Discharge: 2018-03-07 | Disposition: A | Discharge status: Home / Self Care | Payer: Medicare Other | Source: Other Acute Inpatient Hospital | Attending: Internal Medicine | Admitting: Internal Medicine

## 2018-03-07 DIAGNOSIS — E11621 Type 2 diabetes mellitus with foot ulcer: Secondary | ICD-10-CM | POA: Diagnosis not present

## 2018-03-07 DIAGNOSIS — I509 Heart failure, unspecified: Secondary | ICD-10-CM | POA: Diagnosis not present

## 2018-03-07 DIAGNOSIS — L97526 Non-pressure chronic ulcer of other part of left foot with bone involvement without evidence of necrosis: Secondary | ICD-10-CM | POA: Diagnosis not present

## 2018-03-07 DIAGNOSIS — E1122 Type 2 diabetes mellitus with diabetic chronic kidney disease: Secondary | ICD-10-CM | POA: Diagnosis not present

## 2018-03-07 DIAGNOSIS — Z992 Dependence on renal dialysis: Secondary | ICD-10-CM | POA: Diagnosis not present

## 2018-03-07 DIAGNOSIS — L97522 Non-pressure chronic ulcer of other part of left foot with fat layer exposed: Secondary | ICD-10-CM | POA: Diagnosis not present

## 2018-03-07 DIAGNOSIS — I130 Hypertensive heart and chronic kidney disease with heart failure and stage 1 through stage 4 chronic kidney disease, or unspecified chronic kidney disease: Secondary | ICD-10-CM | POA: Diagnosis not present

## 2018-03-07 DIAGNOSIS — L97429 Non-pressure chronic ulcer of left heel and midfoot with unspecified severity: Secondary | ICD-10-CM | POA: Diagnosis not present

## 2018-03-07 DIAGNOSIS — N184 Chronic kidney disease, stage 4 (severe): Secondary | ICD-10-CM | POA: Diagnosis not present

## 2018-03-07 DIAGNOSIS — L97512 Non-pressure chronic ulcer of other part of right foot with fat layer exposed: Secondary | ICD-10-CM | POA: Diagnosis not present

## 2018-03-07 DIAGNOSIS — E1140 Type 2 diabetes mellitus with diabetic neuropathy, unspecified: Secondary | ICD-10-CM | POA: Diagnosis not present

## 2018-03-08 DIAGNOSIS — Z992 Dependence on renal dialysis: Secondary | ICD-10-CM | POA: Diagnosis not present

## 2018-03-08 DIAGNOSIS — I959 Hypotension, unspecified: Secondary | ICD-10-CM | POA: Diagnosis not present

## 2018-03-08 DIAGNOSIS — N2581 Secondary hyperparathyroidism of renal origin: Secondary | ICD-10-CM | POA: Diagnosis not present

## 2018-03-08 DIAGNOSIS — N186 End stage renal disease: Secondary | ICD-10-CM | POA: Diagnosis not present

## 2018-03-08 DIAGNOSIS — E1121 Type 2 diabetes mellitus with diabetic nephropathy: Secondary | ICD-10-CM | POA: Diagnosis not present

## 2018-03-08 DIAGNOSIS — M868X7 Other osteomyelitis, ankle and foot: Secondary | ICD-10-CM | POA: Diagnosis not present

## 2018-03-09 DIAGNOSIS — Z9889 Other specified postprocedural states: Secondary | ICD-10-CM | POA: Diagnosis not present

## 2018-03-10 DIAGNOSIS — E1121 Type 2 diabetes mellitus with diabetic nephropathy: Secondary | ICD-10-CM | POA: Diagnosis not present

## 2018-03-10 DIAGNOSIS — N2581 Secondary hyperparathyroidism of renal origin: Secondary | ICD-10-CM | POA: Diagnosis not present

## 2018-03-10 DIAGNOSIS — N186 End stage renal disease: Secondary | ICD-10-CM | POA: Diagnosis not present

## 2018-03-10 DIAGNOSIS — I959 Hypotension, unspecified: Secondary | ICD-10-CM | POA: Diagnosis not present

## 2018-03-10 DIAGNOSIS — Z992 Dependence on renal dialysis: Secondary | ICD-10-CM | POA: Diagnosis not present

## 2018-03-10 DIAGNOSIS — M868X7 Other osteomyelitis, ankle and foot: Secondary | ICD-10-CM | POA: Diagnosis not present

## 2018-03-10 LAB — AEROBIC CULTURE W GRAM STAIN (SUPERFICIAL SPECIMEN)

## 2018-03-10 LAB — AEROBIC CULTURE  (SUPERFICIAL SPECIMEN): CULTURE: NO GROWTH

## 2018-03-13 DIAGNOSIS — M868X7 Other osteomyelitis, ankle and foot: Secondary | ICD-10-CM | POA: Diagnosis not present

## 2018-03-13 DIAGNOSIS — Z992 Dependence on renal dialysis: Secondary | ICD-10-CM | POA: Diagnosis not present

## 2018-03-13 DIAGNOSIS — N2581 Secondary hyperparathyroidism of renal origin: Secondary | ICD-10-CM | POA: Diagnosis not present

## 2018-03-13 DIAGNOSIS — I959 Hypotension, unspecified: Secondary | ICD-10-CM | POA: Diagnosis not present

## 2018-03-13 DIAGNOSIS — E1121 Type 2 diabetes mellitus with diabetic nephropathy: Secondary | ICD-10-CM | POA: Diagnosis not present

## 2018-03-13 DIAGNOSIS — N186 End stage renal disease: Secondary | ICD-10-CM | POA: Diagnosis not present

## 2018-03-14 DIAGNOSIS — L97522 Non-pressure chronic ulcer of other part of left foot with fat layer exposed: Secondary | ICD-10-CM | POA: Diagnosis not present

## 2018-03-14 DIAGNOSIS — I130 Hypertensive heart and chronic kidney disease with heart failure and stage 1 through stage 4 chronic kidney disease, or unspecified chronic kidney disease: Secondary | ICD-10-CM | POA: Diagnosis not present

## 2018-03-14 DIAGNOSIS — E11621 Type 2 diabetes mellitus with foot ulcer: Secondary | ICD-10-CM | POA: Diagnosis not present

## 2018-03-14 DIAGNOSIS — L97512 Non-pressure chronic ulcer of other part of right foot with fat layer exposed: Secondary | ICD-10-CM | POA: Diagnosis not present

## 2018-03-14 DIAGNOSIS — L97429 Non-pressure chronic ulcer of left heel and midfoot with unspecified severity: Secondary | ICD-10-CM | POA: Diagnosis not present

## 2018-03-14 DIAGNOSIS — L97526 Non-pressure chronic ulcer of other part of left foot with bone involvement without evidence of necrosis: Secondary | ICD-10-CM | POA: Diagnosis not present

## 2018-03-14 DIAGNOSIS — E1122 Type 2 diabetes mellitus with diabetic chronic kidney disease: Secondary | ICD-10-CM | POA: Diagnosis not present

## 2018-03-15 DIAGNOSIS — I959 Hypotension, unspecified: Secondary | ICD-10-CM | POA: Diagnosis not present

## 2018-03-15 DIAGNOSIS — M868X7 Other osteomyelitis, ankle and foot: Secondary | ICD-10-CM | POA: Diagnosis not present

## 2018-03-15 DIAGNOSIS — N2581 Secondary hyperparathyroidism of renal origin: Secondary | ICD-10-CM | POA: Diagnosis not present

## 2018-03-15 DIAGNOSIS — E1121 Type 2 diabetes mellitus with diabetic nephropathy: Secondary | ICD-10-CM | POA: Diagnosis not present

## 2018-03-15 DIAGNOSIS — N186 End stage renal disease: Secondary | ICD-10-CM | POA: Diagnosis not present

## 2018-03-15 DIAGNOSIS — Z992 Dependence on renal dialysis: Secondary | ICD-10-CM | POA: Diagnosis not present

## 2018-03-17 DIAGNOSIS — M868X7 Other osteomyelitis, ankle and foot: Secondary | ICD-10-CM | POA: Diagnosis not present

## 2018-03-17 DIAGNOSIS — E1121 Type 2 diabetes mellitus with diabetic nephropathy: Secondary | ICD-10-CM | POA: Diagnosis not present

## 2018-03-17 DIAGNOSIS — I959 Hypotension, unspecified: Secondary | ICD-10-CM | POA: Diagnosis not present

## 2018-03-17 DIAGNOSIS — N2581 Secondary hyperparathyroidism of renal origin: Secondary | ICD-10-CM | POA: Diagnosis not present

## 2018-03-17 DIAGNOSIS — Z992 Dependence on renal dialysis: Secondary | ICD-10-CM | POA: Diagnosis not present

## 2018-03-17 DIAGNOSIS — N186 End stage renal disease: Secondary | ICD-10-CM | POA: Diagnosis not present

## 2018-03-20 DIAGNOSIS — N186 End stage renal disease: Secondary | ICD-10-CM | POA: Diagnosis not present

## 2018-03-20 DIAGNOSIS — I871 Compression of vein: Secondary | ICD-10-CM | POA: Diagnosis not present

## 2018-03-20 DIAGNOSIS — N2581 Secondary hyperparathyroidism of renal origin: Secondary | ICD-10-CM | POA: Diagnosis not present

## 2018-03-20 DIAGNOSIS — Z992 Dependence on renal dialysis: Secondary | ICD-10-CM | POA: Diagnosis not present

## 2018-03-20 DIAGNOSIS — E1121 Type 2 diabetes mellitus with diabetic nephropathy: Secondary | ICD-10-CM | POA: Diagnosis not present

## 2018-03-20 DIAGNOSIS — M868X7 Other osteomyelitis, ankle and foot: Secondary | ICD-10-CM | POA: Diagnosis not present

## 2018-03-20 DIAGNOSIS — I959 Hypotension, unspecified: Secondary | ICD-10-CM | POA: Diagnosis not present

## 2018-03-21 ENCOUNTER — Other Ambulatory Visit (HOSPITAL_COMMUNITY): Payer: Self-pay | Admitting: Orthopedic Surgery

## 2018-03-21 ENCOUNTER — Ambulatory Visit (HOSPITAL_COMMUNITY)
Admission: RE | Admit: 2018-03-21 | Discharge: 2018-03-21 | Disposition: A | Discharge status: Home / Self Care | Payer: Medicare Other | Source: Ambulatory Visit | Attending: Vascular Surgery | Admitting: Vascular Surgery

## 2018-03-21 DIAGNOSIS — I771 Stricture of artery: Secondary | ICD-10-CM

## 2018-03-21 DIAGNOSIS — L97429 Non-pressure chronic ulcer of left heel and midfoot with unspecified severity: Secondary | ICD-10-CM | POA: Diagnosis not present

## 2018-03-21 DIAGNOSIS — I10 Essential (primary) hypertension: Secondary | ICD-10-CM | POA: Diagnosis not present

## 2018-03-21 DIAGNOSIS — E1122 Type 2 diabetes mellitus with diabetic chronic kidney disease: Secondary | ICD-10-CM | POA: Diagnosis not present

## 2018-03-21 DIAGNOSIS — E11621 Type 2 diabetes mellitus with foot ulcer: Secondary | ICD-10-CM | POA: Diagnosis not present

## 2018-03-21 DIAGNOSIS — Z794 Long term (current) use of insulin: Secondary | ICD-10-CM | POA: Insufficient documentation

## 2018-03-21 DIAGNOSIS — L97512 Non-pressure chronic ulcer of other part of right foot with fat layer exposed: Secondary | ICD-10-CM | POA: Diagnosis not present

## 2018-03-21 DIAGNOSIS — I130 Hypertensive heart and chronic kidney disease with heart failure and stage 1 through stage 4 chronic kidney disease, or unspecified chronic kidney disease: Secondary | ICD-10-CM | POA: Diagnosis not present

## 2018-03-21 DIAGNOSIS — Z87891 Personal history of nicotine dependence: Secondary | ICD-10-CM | POA: Insufficient documentation

## 2018-03-21 DIAGNOSIS — L97522 Non-pressure chronic ulcer of other part of left foot with fat layer exposed: Secondary | ICD-10-CM | POA: Diagnosis not present

## 2018-03-21 DIAGNOSIS — L97529 Non-pressure chronic ulcer of other part of left foot with unspecified severity: Secondary | ICD-10-CM | POA: Diagnosis not present

## 2018-03-21 DIAGNOSIS — L97526 Non-pressure chronic ulcer of other part of left foot with bone involvement without evidence of necrosis: Secondary | ICD-10-CM | POA: Diagnosis not present

## 2018-03-22 DIAGNOSIS — I959 Hypotension, unspecified: Secondary | ICD-10-CM | POA: Diagnosis not present

## 2018-03-22 DIAGNOSIS — N186 End stage renal disease: Secondary | ICD-10-CM | POA: Diagnosis not present

## 2018-03-22 DIAGNOSIS — Z992 Dependence on renal dialysis: Secondary | ICD-10-CM | POA: Diagnosis not present

## 2018-03-22 DIAGNOSIS — M868X7 Other osteomyelitis, ankle and foot: Secondary | ICD-10-CM | POA: Diagnosis not present

## 2018-03-22 DIAGNOSIS — E1121 Type 2 diabetes mellitus with diabetic nephropathy: Secondary | ICD-10-CM | POA: Diagnosis not present

## 2018-03-22 DIAGNOSIS — N2581 Secondary hyperparathyroidism of renal origin: Secondary | ICD-10-CM | POA: Diagnosis not present

## 2018-03-24 DIAGNOSIS — I959 Hypotension, unspecified: Secondary | ICD-10-CM | POA: Diagnosis not present

## 2018-03-24 DIAGNOSIS — N186 End stage renal disease: Secondary | ICD-10-CM | POA: Diagnosis not present

## 2018-03-24 DIAGNOSIS — N2581 Secondary hyperparathyroidism of renal origin: Secondary | ICD-10-CM | POA: Diagnosis not present

## 2018-03-24 DIAGNOSIS — Z992 Dependence on renal dialysis: Secondary | ICD-10-CM | POA: Diagnosis not present

## 2018-03-24 DIAGNOSIS — E1121 Type 2 diabetes mellitus with diabetic nephropathy: Secondary | ICD-10-CM | POA: Diagnosis not present

## 2018-03-24 DIAGNOSIS — M868X7 Other osteomyelitis, ankle and foot: Secondary | ICD-10-CM | POA: Diagnosis not present

## 2018-03-27 DIAGNOSIS — E1121 Type 2 diabetes mellitus with diabetic nephropathy: Secondary | ICD-10-CM | POA: Diagnosis not present

## 2018-03-27 DIAGNOSIS — E1122 Type 2 diabetes mellitus with diabetic chronic kidney disease: Secondary | ICD-10-CM | POA: Diagnosis not present

## 2018-03-27 DIAGNOSIS — L97522 Non-pressure chronic ulcer of other part of left foot with fat layer exposed: Secondary | ICD-10-CM | POA: Diagnosis not present

## 2018-03-27 DIAGNOSIS — N2581 Secondary hyperparathyroidism of renal origin: Secondary | ICD-10-CM | POA: Diagnosis not present

## 2018-03-27 DIAGNOSIS — N186 End stage renal disease: Secondary | ICD-10-CM | POA: Diagnosis not present

## 2018-03-27 DIAGNOSIS — M86572 Other chronic hematogenous osteomyelitis, left ankle and foot: Secondary | ICD-10-CM | POA: Diagnosis not present

## 2018-03-27 DIAGNOSIS — E11621 Type 2 diabetes mellitus with foot ulcer: Secondary | ICD-10-CM | POA: Diagnosis not present

## 2018-03-27 DIAGNOSIS — I132 Hypertensive heart and chronic kidney disease with heart failure and with stage 5 chronic kidney disease, or end stage renal disease: Secondary | ICD-10-CM | POA: Diagnosis not present

## 2018-03-27 DIAGNOSIS — M86271 Subacute osteomyelitis, right ankle and foot: Secondary | ICD-10-CM | POA: Diagnosis not present

## 2018-03-27 DIAGNOSIS — I509 Heart failure, unspecified: Secondary | ICD-10-CM | POA: Diagnosis not present

## 2018-03-27 DIAGNOSIS — L97512 Non-pressure chronic ulcer of other part of right foot with fat layer exposed: Secondary | ICD-10-CM | POA: Diagnosis not present

## 2018-03-27 DIAGNOSIS — L97422 Non-pressure chronic ulcer of left heel and midfoot with fat layer exposed: Secondary | ICD-10-CM | POA: Diagnosis not present

## 2018-03-27 DIAGNOSIS — L97516 Non-pressure chronic ulcer of other part of right foot with bone involvement without evidence of necrosis: Secondary | ICD-10-CM | POA: Diagnosis not present

## 2018-03-27 DIAGNOSIS — Z992 Dependence on renal dialysis: Secondary | ICD-10-CM | POA: Diagnosis not present

## 2018-03-27 DIAGNOSIS — M868X7 Other osteomyelitis, ankle and foot: Secondary | ICD-10-CM | POA: Diagnosis not present

## 2018-03-28 ENCOUNTER — Ambulatory Visit (INDEPENDENT_AMBULATORY_CARE_PROVIDER_SITE_OTHER): Payer: Medicare Other | Admitting: Infectious Diseases

## 2018-03-28 ENCOUNTER — Encounter: Payer: Self-pay | Admitting: Infectious Diseases

## 2018-03-28 ENCOUNTER — Encounter (HOSPITAL_BASED_OUTPATIENT_CLINIC_OR_DEPARTMENT_OTHER): Payer: Medicare Other | Attending: Internal Medicine

## 2018-03-28 DIAGNOSIS — L97422 Non-pressure chronic ulcer of left heel and midfoot with fat layer exposed: Secondary | ICD-10-CM | POA: Diagnosis not present

## 2018-03-28 DIAGNOSIS — L97516 Non-pressure chronic ulcer of other part of right foot with bone involvement without evidence of necrosis: Secondary | ICD-10-CM | POA: Diagnosis not present

## 2018-03-28 DIAGNOSIS — E1140 Type 2 diabetes mellitus with diabetic neuropathy, unspecified: Secondary | ICD-10-CM | POA: Insufficient documentation

## 2018-03-28 DIAGNOSIS — I509 Heart failure, unspecified: Secondary | ICD-10-CM | POA: Diagnosis not present

## 2018-03-28 DIAGNOSIS — I132 Hypertensive heart and chronic kidney disease with heart failure and with stage 5 chronic kidney disease, or end stage renal disease: Secondary | ICD-10-CM | POA: Insufficient documentation

## 2018-03-28 DIAGNOSIS — E1121 Type 2 diabetes mellitus with diabetic nephropathy: Secondary | ICD-10-CM

## 2018-03-28 DIAGNOSIS — E1136 Type 2 diabetes mellitus with diabetic cataract: Secondary | ICD-10-CM | POA: Insufficient documentation

## 2018-03-28 DIAGNOSIS — N186 End stage renal disease: Secondary | ICD-10-CM | POA: Insufficient documentation

## 2018-03-28 DIAGNOSIS — Z992 Dependence on renal dialysis: Secondary | ICD-10-CM | POA: Diagnosis not present

## 2018-03-28 DIAGNOSIS — M86271 Subacute osteomyelitis, right ankle and foot: Secondary | ICD-10-CM | POA: Diagnosis not present

## 2018-03-28 DIAGNOSIS — E1151 Type 2 diabetes mellitus with diabetic peripheral angiopathy without gangrene: Secondary | ICD-10-CM | POA: Diagnosis not present

## 2018-03-28 DIAGNOSIS — E1122 Type 2 diabetes mellitus with diabetic chronic kidney disease: Secondary | ICD-10-CM | POA: Insufficient documentation

## 2018-03-28 DIAGNOSIS — L97512 Non-pressure chronic ulcer of other part of right foot with fat layer exposed: Secondary | ICD-10-CM | POA: Diagnosis not present

## 2018-03-28 DIAGNOSIS — E11621 Type 2 diabetes mellitus with foot ulcer: Secondary | ICD-10-CM | POA: Insufficient documentation

## 2018-03-28 DIAGNOSIS — L97522 Non-pressure chronic ulcer of other part of left foot with fat layer exposed: Secondary | ICD-10-CM | POA: Insufficient documentation

## 2018-03-28 DIAGNOSIS — M86572 Other chronic hematogenous osteomyelitis, left ankle and foot: Secondary | ICD-10-CM | POA: Diagnosis not present

## 2018-03-28 NOTE — Assessment & Plan Note (Signed)
He will finnish his anbx on 4-11.  He appears to be doing well- no d/c or odor from his wounds.  Greatly appreciate his ortho and [facility] f/u He needs continued local care.  He will rtc in 3 months for f/u.

## 2018-03-28 NOTE — Progress Notes (Signed)
   Subjective:    Patient ID: [name] MISURACA Sr., male    DOB: [birth year], 67 y.o.   MRN: [id number]  HPI 67yo M with hx of DM2, ESRD, CAD, and ulcers on R first metatarsal head; L 5th and L 1st MT heads.  He was seen in [facility] on 5-21-18with worsening of his R 5th ray and was sent to hospital. His wound Cx was polymicrobial. He underwent R 5th ray amputation.    On 01-27-18 he underwent (by Dr [name])  1. 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 2. Excisional debridement of plantar wound. 3. Amputation of the left 2nd toe at the interphalangeal joint. 4. Excisional debridement of skin, subcutaneous tissue, and bone at the site of a complex wound, left foot.   His FSG have been good, 97-121. 115 currently from his app.   He had [facility] f/u 02-02-18 and was continued on IV anbx with [facility] for 6 weeks (end date 4- and 4-10) Continues to f/u with [facility].  Today is day 49 of rx (starting from 02-02-18) of rx.   Has had f/u with Dr Marval Regal.   Review of Systems  Constitutional: Negative for chills, fever and unexpected weight change.  Gastrointestinal: Negative for constipation and diarrhea.  anuric Please see HPI. All other systems reviewed and negative.     Objective:   Physical Exam  Constitutional: He is oriented to person, place, and time. He appears well-developed and well-nourished.  HENT:  Mouth/Throat: No oropharyngeal exudate.  Eyes: Pupils are equal, round, and reactive to light. EOM are normal.  Neck: Neck supple.  Cardiovascular: Normal rate, regular rhythm and normal heart sounds.  Pulmonary/Chest: Effort normal and breath sounds normal.  Abdominal: Soft. Bowel sounds are normal. There is no tenderness. There is no rebound.  Musculoskeletal:  Please see photos.  He has dense callus around L lateral foot wound. There is some tunneling within this wound. His 2nd toe stump has adherent skin flap. His 3rd toe has dense callus at tip.  There is no d/c or odor  from any of his wounds.   Lymphadenopathy:    He has no cervical adenopathy.  Neurological: He is alert and oriented to person, place, and time.  Psychiatric: He has a normal mood and affect. Judgment normal.           Assessment & Plan:

## 2018-03-28 NOTE — Assessment & Plan Note (Signed)
He appears to be doing well Monitoring regularly.  Will f/u with pcp

## 2018-03-28 NOTE — Assessment & Plan Note (Signed)
Appreciate Dr Elissa Hefty f/u.  He has +bruit over his fistula.  He is doing well

## 2018-03-29 DIAGNOSIS — E1121 Type 2 diabetes mellitus with diabetic nephropathy: Secondary | ICD-10-CM | POA: Diagnosis not present

## 2018-03-29 DIAGNOSIS — N2581 Secondary hyperparathyroidism of renal origin: Secondary | ICD-10-CM | POA: Diagnosis not present

## 2018-03-29 DIAGNOSIS — M868X7 Other osteomyelitis, ankle and foot: Secondary | ICD-10-CM | POA: Diagnosis not present

## 2018-03-29 DIAGNOSIS — N186 End stage renal disease: Secondary | ICD-10-CM | POA: Diagnosis not present

## 2018-03-29 DIAGNOSIS — Z992 Dependence on renal dialysis: Secondary | ICD-10-CM | POA: Diagnosis not present

## 2018-03-31 DIAGNOSIS — M868X7 Other osteomyelitis, ankle and foot: Secondary | ICD-10-CM | POA: Diagnosis not present

## 2018-03-31 DIAGNOSIS — E1121 Type 2 diabetes mellitus with diabetic nephropathy: Secondary | ICD-10-CM | POA: Diagnosis not present

## 2018-03-31 DIAGNOSIS — N2581 Secondary hyperparathyroidism of renal origin: Secondary | ICD-10-CM | POA: Diagnosis not present

## 2018-03-31 DIAGNOSIS — N186 End stage renal disease: Secondary | ICD-10-CM | POA: Diagnosis not present

## 2018-03-31 DIAGNOSIS — Z992 Dependence on renal dialysis: Secondary | ICD-10-CM | POA: Diagnosis not present

## 2018-04-03 DIAGNOSIS — E1121 Type 2 diabetes mellitus with diabetic nephropathy: Secondary | ICD-10-CM | POA: Diagnosis not present

## 2018-04-03 DIAGNOSIS — M868X7 Other osteomyelitis, ankle and foot: Secondary | ICD-10-CM | POA: Diagnosis not present

## 2018-04-03 DIAGNOSIS — N186 End stage renal disease: Secondary | ICD-10-CM | POA: Diagnosis not present

## 2018-04-03 DIAGNOSIS — N2581 Secondary hyperparathyroidism of renal origin: Secondary | ICD-10-CM | POA: Diagnosis not present

## 2018-04-03 DIAGNOSIS — Z992 Dependence on renal dialysis: Secondary | ICD-10-CM | POA: Diagnosis not present

## 2018-04-04 DIAGNOSIS — I132 Hypertensive heart and chronic kidney disease with heart failure and with stage 5 chronic kidney disease, or end stage renal disease: Secondary | ICD-10-CM | POA: Diagnosis not present

## 2018-04-04 DIAGNOSIS — L97422 Non-pressure chronic ulcer of left heel and midfoot with fat layer exposed: Secondary | ICD-10-CM | POA: Diagnosis not present

## 2018-04-04 DIAGNOSIS — E11621 Type 2 diabetes mellitus with foot ulcer: Secondary | ICD-10-CM | POA: Diagnosis not present

## 2018-04-04 DIAGNOSIS — L97522 Non-pressure chronic ulcer of other part of left foot with fat layer exposed: Secondary | ICD-10-CM | POA: Diagnosis not present

## 2018-04-04 DIAGNOSIS — I509 Heart failure, unspecified: Secondary | ICD-10-CM | POA: Diagnosis not present

## 2018-04-04 DIAGNOSIS — L97516 Non-pressure chronic ulcer of other part of right foot with bone involvement without evidence of necrosis: Secondary | ICD-10-CM | POA: Diagnosis not present

## 2018-04-05 DIAGNOSIS — E1121 Type 2 diabetes mellitus with diabetic nephropathy: Secondary | ICD-10-CM | POA: Diagnosis not present

## 2018-04-05 DIAGNOSIS — N2581 Secondary hyperparathyroidism of renal origin: Secondary | ICD-10-CM | POA: Diagnosis not present

## 2018-04-05 DIAGNOSIS — N186 End stage renal disease: Secondary | ICD-10-CM | POA: Diagnosis not present

## 2018-04-05 DIAGNOSIS — Z992 Dependence on renal dialysis: Secondary | ICD-10-CM | POA: Diagnosis not present

## 2018-04-05 DIAGNOSIS — M868X7 Other osteomyelitis, ankle and foot: Secondary | ICD-10-CM | POA: Diagnosis not present

## 2018-04-07 DIAGNOSIS — N186 End stage renal disease: Secondary | ICD-10-CM | POA: Diagnosis not present

## 2018-04-07 DIAGNOSIS — M868X7 Other osteomyelitis, ankle and foot: Secondary | ICD-10-CM | POA: Diagnosis not present

## 2018-04-07 DIAGNOSIS — E1121 Type 2 diabetes mellitus with diabetic nephropathy: Secondary | ICD-10-CM | POA: Diagnosis not present

## 2018-04-07 DIAGNOSIS — Z992 Dependence on renal dialysis: Secondary | ICD-10-CM | POA: Diagnosis not present

## 2018-04-07 DIAGNOSIS — N2581 Secondary hyperparathyroidism of renal origin: Secondary | ICD-10-CM | POA: Diagnosis not present

## 2018-04-10 DIAGNOSIS — N2581 Secondary hyperparathyroidism of renal origin: Secondary | ICD-10-CM | POA: Diagnosis not present

## 2018-04-10 DIAGNOSIS — Z992 Dependence on renal dialysis: Secondary | ICD-10-CM | POA: Diagnosis not present

## 2018-04-10 DIAGNOSIS — N186 End stage renal disease: Secondary | ICD-10-CM | POA: Diagnosis not present

## 2018-04-10 DIAGNOSIS — E1121 Type 2 diabetes mellitus with diabetic nephropathy: Secondary | ICD-10-CM | POA: Diagnosis not present

## 2018-04-10 DIAGNOSIS — M868X7 Other osteomyelitis, ankle and foot: Secondary | ICD-10-CM | POA: Diagnosis not present

## 2018-04-11 ENCOUNTER — Other Ambulatory Visit (HOSPITAL_BASED_OUTPATIENT_CLINIC_OR_DEPARTMENT_OTHER): Payer: Self-pay | Admitting: Internal Medicine

## 2018-04-11 DIAGNOSIS — L97522 Non-pressure chronic ulcer of other part of left foot with fat layer exposed: Secondary | ICD-10-CM | POA: Diagnosis not present

## 2018-04-11 DIAGNOSIS — E1169 Type 2 diabetes mellitus with other specified complication: Principal | ICD-10-CM

## 2018-04-11 DIAGNOSIS — L97509 Non-pressure chronic ulcer of other part of unspecified foot with unspecified severity: Principal | ICD-10-CM

## 2018-04-11 DIAGNOSIS — L97516 Non-pressure chronic ulcer of other part of right foot with bone involvement without evidence of necrosis: Secondary | ICD-10-CM | POA: Diagnosis not present

## 2018-04-11 DIAGNOSIS — I509 Heart failure, unspecified: Secondary | ICD-10-CM | POA: Diagnosis not present

## 2018-04-11 DIAGNOSIS — M869 Osteomyelitis, unspecified: Principal | ICD-10-CM

## 2018-04-11 DIAGNOSIS — I132 Hypertensive heart and chronic kidney disease with heart failure and with stage 5 chronic kidney disease, or end stage renal disease: Secondary | ICD-10-CM | POA: Diagnosis not present

## 2018-04-11 DIAGNOSIS — E11621 Type 2 diabetes mellitus with foot ulcer: Secondary | ICD-10-CM | POA: Diagnosis not present

## 2018-04-11 DIAGNOSIS — L97422 Non-pressure chronic ulcer of left heel and midfoot with fat layer exposed: Secondary | ICD-10-CM | POA: Diagnosis not present

## 2018-04-12 DIAGNOSIS — M868X7 Other osteomyelitis, ankle and foot: Secondary | ICD-10-CM | POA: Diagnosis not present

## 2018-04-12 DIAGNOSIS — Z992 Dependence on renal dialysis: Secondary | ICD-10-CM | POA: Diagnosis not present

## 2018-04-12 DIAGNOSIS — N186 End stage renal disease: Secondary | ICD-10-CM | POA: Diagnosis not present

## 2018-04-12 DIAGNOSIS — N2581 Secondary hyperparathyroidism of renal origin: Secondary | ICD-10-CM | POA: Diagnosis not present

## 2018-04-12 DIAGNOSIS — E1121 Type 2 diabetes mellitus with diabetic nephropathy: Secondary | ICD-10-CM | POA: Diagnosis not present

## 2018-04-13 ENCOUNTER — Encounter: Payer: Medicare Other | Admitting: Surgery

## 2018-04-14 DIAGNOSIS — N2581 Secondary hyperparathyroidism of renal origin: Secondary | ICD-10-CM | POA: Diagnosis not present

## 2018-04-14 DIAGNOSIS — E1121 Type 2 diabetes mellitus with diabetic nephropathy: Secondary | ICD-10-CM | POA: Diagnosis not present

## 2018-04-14 DIAGNOSIS — N186 End stage renal disease: Secondary | ICD-10-CM | POA: Diagnosis not present

## 2018-04-14 DIAGNOSIS — Z992 Dependence on renal dialysis: Secondary | ICD-10-CM | POA: Diagnosis not present

## 2018-04-14 DIAGNOSIS — M868X7 Other osteomyelitis, ankle and foot: Secondary | ICD-10-CM | POA: Diagnosis not present

## 2018-04-17 DIAGNOSIS — N186 End stage renal disease: Secondary | ICD-10-CM | POA: Diagnosis not present

## 2018-04-17 DIAGNOSIS — N2581 Secondary hyperparathyroidism of renal origin: Secondary | ICD-10-CM | POA: Diagnosis not present

## 2018-04-17 DIAGNOSIS — M868X7 Other osteomyelitis, ankle and foot: Secondary | ICD-10-CM | POA: Diagnosis not present

## 2018-04-17 DIAGNOSIS — Z992 Dependence on renal dialysis: Secondary | ICD-10-CM | POA: Diagnosis not present

## 2018-04-17 DIAGNOSIS — E1121 Type 2 diabetes mellitus with diabetic nephropathy: Secondary | ICD-10-CM | POA: Diagnosis not present

## 2018-04-18 ENCOUNTER — Other Ambulatory Visit (HOSPITAL_COMMUNITY)
Admit: 2018-04-18 | Discharge: 2018-04-18 | Disposition: A | Discharge status: Home / Self Care | Payer: Medicare Other | Source: Ambulatory Visit | Attending: Internal Medicine | Admitting: Internal Medicine

## 2018-04-18 DIAGNOSIS — E11621 Type 2 diabetes mellitus with foot ulcer: Secondary | ICD-10-CM | POA: Insufficient documentation

## 2018-04-18 DIAGNOSIS — L97522 Non-pressure chronic ulcer of other part of left foot with fat layer exposed: Secondary | ICD-10-CM | POA: Diagnosis not present

## 2018-04-18 DIAGNOSIS — I509 Heart failure, unspecified: Secondary | ICD-10-CM | POA: Diagnosis not present

## 2018-04-18 DIAGNOSIS — L03116 Cellulitis of left lower limb: Secondary | ICD-10-CM | POA: Diagnosis not present

## 2018-04-18 DIAGNOSIS — I132 Hypertensive heart and chronic kidney disease with heart failure and with stage 5 chronic kidney disease, or end stage renal disease: Secondary | ICD-10-CM | POA: Diagnosis not present

## 2018-04-18 DIAGNOSIS — L97516 Non-pressure chronic ulcer of other part of right foot with bone involvement without evidence of necrosis: Secondary | ICD-10-CM | POA: Diagnosis not present

## 2018-04-18 DIAGNOSIS — L97422 Non-pressure chronic ulcer of left heel and midfoot with fat layer exposed: Secondary | ICD-10-CM | POA: Diagnosis not present

## 2018-04-19 DIAGNOSIS — N2581 Secondary hyperparathyroidism of renal origin: Secondary | ICD-10-CM | POA: Diagnosis not present

## 2018-04-19 DIAGNOSIS — N186 End stage renal disease: Secondary | ICD-10-CM | POA: Diagnosis not present

## 2018-04-19 DIAGNOSIS — M868X7 Other osteomyelitis, ankle and foot: Secondary | ICD-10-CM | POA: Diagnosis not present

## 2018-04-19 DIAGNOSIS — Z992 Dependence on renal dialysis: Secondary | ICD-10-CM | POA: Diagnosis not present

## 2018-04-19 DIAGNOSIS — E1121 Type 2 diabetes mellitus with diabetic nephropathy: Secondary | ICD-10-CM | POA: Diagnosis not present

## 2018-04-20 ENCOUNTER — Ambulatory Visit (HOSPITAL_COMMUNITY)
Admission: RE | Admit: 2018-04-20 | Discharge: 2018-04-20 | Disposition: A | Discharge status: Home / Self Care | Payer: Medicare Other | Source: Ambulatory Visit | Attending: Internal Medicine | Admitting: Internal Medicine

## 2018-04-20 DIAGNOSIS — L97419 Non-pressure chronic ulcer of right heel and midfoot with unspecified severity: Secondary | ICD-10-CM | POA: Insufficient documentation

## 2018-04-20 DIAGNOSIS — E1169 Type 2 diabetes mellitus with other specified complication: Secondary | ICD-10-CM

## 2018-04-20 DIAGNOSIS — L97509 Non-pressure chronic ulcer of other part of unspecified foot with unspecified severity: Secondary | ICD-10-CM

## 2018-04-20 DIAGNOSIS — M19071 Primary osteoarthritis, right ankle and foot: Secondary | ICD-10-CM | POA: Insufficient documentation

## 2018-04-20 DIAGNOSIS — Z89421 Acquired absence of other right toe(s): Secondary | ICD-10-CM | POA: Diagnosis not present

## 2018-04-20 DIAGNOSIS — M869 Osteomyelitis, unspecified: Secondary | ICD-10-CM

## 2018-04-20 DIAGNOSIS — E11621 Type 2 diabetes mellitus with foot ulcer: Secondary | ICD-10-CM | POA: Insufficient documentation

## 2018-04-21 DIAGNOSIS — M868X7 Other osteomyelitis, ankle and foot: Secondary | ICD-10-CM | POA: Diagnosis not present

## 2018-04-21 DIAGNOSIS — N186 End stage renal disease: Secondary | ICD-10-CM | POA: Diagnosis not present

## 2018-04-21 DIAGNOSIS — Z992 Dependence on renal dialysis: Secondary | ICD-10-CM | POA: Diagnosis not present

## 2018-04-21 DIAGNOSIS — E1121 Type 2 diabetes mellitus with diabetic nephropathy: Secondary | ICD-10-CM | POA: Diagnosis not present

## 2018-04-21 DIAGNOSIS — N2581 Secondary hyperparathyroidism of renal origin: Secondary | ICD-10-CM | POA: Diagnosis not present

## 2018-04-21 LAB — AEROBIC CULTURE  (SUPERFICIAL SPECIMEN)

## 2018-04-21 LAB — AEROBIC CULTURE W GRAM STAIN (SUPERFICIAL SPECIMEN)

## 2018-04-24 DIAGNOSIS — E1121 Type 2 diabetes mellitus with diabetic nephropathy: Secondary | ICD-10-CM | POA: Diagnosis not present

## 2018-04-24 DIAGNOSIS — N186 End stage renal disease: Secondary | ICD-10-CM | POA: Diagnosis not present

## 2018-04-24 DIAGNOSIS — Z992 Dependence on renal dialysis: Secondary | ICD-10-CM | POA: Diagnosis not present

## 2018-04-24 DIAGNOSIS — N2581 Secondary hyperparathyroidism of renal origin: Secondary | ICD-10-CM | POA: Diagnosis not present

## 2018-04-24 DIAGNOSIS — M868X7 Other osteomyelitis, ankle and foot: Secondary | ICD-10-CM | POA: Diagnosis not present

## 2018-04-25 DIAGNOSIS — L97522 Non-pressure chronic ulcer of other part of left foot with fat layer exposed: Secondary | ICD-10-CM | POA: Diagnosis not present

## 2018-04-25 DIAGNOSIS — E11621 Type 2 diabetes mellitus with foot ulcer: Secondary | ICD-10-CM | POA: Diagnosis not present

## 2018-04-25 DIAGNOSIS — I132 Hypertensive heart and chronic kidney disease with heart failure and with stage 5 chronic kidney disease, or end stage renal disease: Secondary | ICD-10-CM | POA: Diagnosis not present

## 2018-04-25 DIAGNOSIS — L97512 Non-pressure chronic ulcer of other part of right foot with fat layer exposed: Secondary | ICD-10-CM | POA: Diagnosis not present

## 2018-04-25 DIAGNOSIS — L97516 Non-pressure chronic ulcer of other part of right foot with bone involvement without evidence of necrosis: Secondary | ICD-10-CM | POA: Diagnosis not present

## 2018-04-25 DIAGNOSIS — L97422 Non-pressure chronic ulcer of left heel and midfoot with fat layer exposed: Secondary | ICD-10-CM | POA: Diagnosis not present

## 2018-04-25 DIAGNOSIS — I509 Heart failure, unspecified: Secondary | ICD-10-CM | POA: Diagnosis not present

## 2018-04-26 DIAGNOSIS — N186 End stage renal disease: Secondary | ICD-10-CM | POA: Diagnosis not present

## 2018-04-26 DIAGNOSIS — E1122 Type 2 diabetes mellitus with diabetic chronic kidney disease: Secondary | ICD-10-CM | POA: Diagnosis not present

## 2018-04-26 DIAGNOSIS — Z992 Dependence on renal dialysis: Secondary | ICD-10-CM | POA: Diagnosis not present

## 2018-04-27 ENCOUNTER — Other Ambulatory Visit: Payer: Self-pay

## 2018-04-27 ENCOUNTER — Ambulatory Visit (INDEPENDENT_AMBULATORY_CARE_PROVIDER_SITE_OTHER): Payer: Medicare Other | Admitting: Vascular Surgery

## 2018-04-27 ENCOUNTER — Encounter: Payer: Self-pay | Admitting: Vascular Surgery

## 2018-04-27 VITALS — BP 119/68 | HR 73 | Temp 98.7°F | Resp 20 | Ht 73.0 in | Wt 216.0 lb

## 2018-04-27 DIAGNOSIS — I739 Peripheral vascular disease, unspecified: Secondary | ICD-10-CM | POA: Diagnosis not present

## 2018-04-27 NOTE — Progress Notes (Signed)
Patient name: [name]. MRN: [id number] DOB: Sep 28, 1951 Sex: male   REASON FOR CONSULT:    Peripheral vascular disease.  The consult is requested by Dr. Dorna Leitz.  HPI:   [name]. is a pleasant 67 y.o. male who was referred with nonhealing wounds of both feet and peripheral vascular disease.  This patient has had chronic wounds on both feet.  On the right side he developed wounds on the plantar aspect of the foot and toes in December 2018.  On the left side he had wounds beginning in May 2018.  These wounds have never completely healed.  I have reviewed the records that was sent from the referring office.  On 01/27/2018, he underwent right third toe amputation and excisional debridement of a wound on the plantar aspect of the foot.  Also amputation of the left second toe.  The patient was seen on 02/27/2018 and still has some ulceration along the lateral aspect of the left foot.  He had evidence of peripheral vascular disease and ABIs were ordered.  These were abnormal and he was sent for vascular consultation.  Patient is ambulatory but denies any significant claudication.  He denies any rest pain.  He said very aggressive treatment for his wounds including hyperbaric oxygen treatments in the past.  According to the patient, his most recent MRI showed no evidence of osteomyelitis of the right foot.  This was on 04/20/2018.  He dialyzes on Monday Wednesdays and Fridays.  Past Medical History:  Diagnosis Date  . Cardiomyopathy ([place])   . Cataract    right eye  . Diabetes mellitus    type 2  . Diabetic foot ulcer ([place])   . Diabetic infection of right foot ([place])   . Diabetic peripheral neuropathy ([place])   . ESRD (end stage renal disease) ([place])    m-w-f [facility] [address]  . Glaucoma    left eye  . Headache   . HTN (hypertension)   . Osteomyelitis ([place])   . Peripheral vascular disease ([facility])    critical limb ischemia    Family History    Problem Relation Age of Onset  . Heart disease Mother   . Diabetes Father   . Diabetes Brother   . Diabetes Sister   . Diabetes Brother   . Diabetes Brother     SOCIAL HISTORY: Social History   Socioeconomic History  . Marital status: Married    Spouse name: [name]  . Number of children: 3  . Years of education: 5  . Highest education level: Not on file  Occupational History  . Occupation: Printmaker: [employer]  Social Needs  . Financial resource strain: Not on file  . Food insecurity:    Worry: Not on file    Inability: Not on file  . Transportation needs:    Medical: Not on file    Non-medical: Not on file  Tobacco Use  . Smoking status: Former Smoker    Packs/day: 0.50    Years: 4.00    Pack years: 2.00    Types: Cigarettes  . Smokeless tobacco: Never Used  . Tobacco comment: about 40 yrs ago  Substance and Sexual Activity  . Alcohol use: No    Alcohol/week: 0.0 oz  . Drug use: No  . Sexual activity: Yes  Lifestyle  . Physical activity:    Days per week: Not on file    Minutes per session:  Not on file  . Stress: Not on file  Relationships  . Social connections:    Talks on phone: Not on file    Gets together: Not on file    Attends religious service: Not on file    Active member of club or organization: Not on file    Attends meetings of clubs or organizations: Not on file    Relationship status: Not on file  . Intimate partner violence:    Fear of current or ex partner: Not on file    Emotionally abused: Not on file    Physically abused: Not on file    Forced sexual activity: Not on file  Other Topics Concern  . Not on file  Social History Narrative  . Not on file    Allergies  Allergen Reactions  . Penicillins Hives and Swelling    Ankle swelling  PATIENT HAD A PCN REACTION WITH IMMEDIATE RASH, FACIAL/TONGUE/THROAT SWELLING, SOB, OR LIGHTHEADEDNESS WITH HYPOTENSION:  #  #  #  YES  #  #  #   Has patient had a  PCN reaction causing severe rash involving mucus membranes or skin necrosis: no Has patient had a PCN reaction that required hospitalization: no Has patient had a PCN reaction occurring within the last 10 years: no If all of the above answers are "NO", then may proceed with Cephalosporin use.    Current Outpatient Medications  Medication Sig Dispense Refill  . acetaminophen (TYLENOL) 325 MG tablet Take 650 mg by mouth every 6 (six) hours as needed (pain).     . Ascorbic Acid (VITAMIN C PO) Take 1 tablet by mouth daily.    Marland Kitchen atorvastatin (LIPITOR) 20 MG tablet Take 20 mg by mouth every evening.     . carvedilol (COREG) 6.25 MG tablet Take 6.25 mg by mouth every morning.    . cinacalcet (SENSIPAR) 90 MG tablet Take 90 mg by mouth See admin instructions. Given at dialysis    . ferric citrate (AURYXIA) 1 GM 210 MG(Fe) tablet Take 420 mg by mouth 3 (three) times daily with meals.     Marland Kitchen glucose monitoring kit (FREESTYLE) monitoring kit 1 each by Does not apply route 4 (four) times daily - after meals and at bedtime. 1 month Diabetic Testing Supplies for QAC-QHS accuchecks.Any brand OK 1 each 1  . HYDROcodone-acetaminophen (NORCO/VICODIN) 5-325 MG tablet Take 1 tablet by mouth every 6 (six) hours as needed for moderate pain. 30 tablet 0  . insulin aspart (NOVOLOG) 100 UNIT/ML injection Before each meal 3 times a day, 140-199 - 2 units, 200-250 - 4 units, 251-299 - 6 units,  300-349 - 8 units,  350 or above 10 units. Insulin PEN if approved, provide syringes and needles if needed. 10 mL 11  . insulin NPH-regular Human (NOVOLIN 70/30) (70-30) 100 UNIT/ML injection Inject 1-5 Units into the skin 2 (two) times daily with a meal. Sliding Scale    . Insulin Syringe-Needle U-100 25G X 1" 1 ML MISC Any brand, for 4 times a day insulin SQ, 1 month supply. 30 each 0  . Multiple Vitamins-Minerals (MULTIVITAMIN WITH MINERALS) tablet Take 1 tablet by mouth daily.    . multivitamin (RENA-VIT) TABS tablet Take 1  tablet by mouth at bedtime. 30 each 0  . mupirocin ointment (BACTROBAN) 2 % Apply 1 application topically daily as needed (rash on stomach).   0  . primidone (MYSOLINE) 50 MG tablet Take 0.5 tablets (25 mg total) by mouth at bedtime. (Patient  taking differently: Take 50 mg by mouth at bedtime. ) 30 tablet 0  . timolol (TIMOPTIC) 0.5 % ophthalmic solution Place 1 drop into the left eye 2 (two) times daily.     No current facility-administered medications for this visit.     REVIEW OF SYSTEMS:  _0  denotes positive finding, _1  denotes negative finding Cardiac  Comments:  Chest pain or chest pressure:    Shortness of breath upon exertion: x   Short of breath when lying flat:    Irregular heart rhythm:        Vascular    Pain in calf, thigh, or hip brought on by ambulation: x   Pain in feet at night that wakes you up from your sleep:     Blood clot in your veins:    Leg swelling:         Pulmonary    Oxygen at home:    Productive cough:     Wheezing:         Neurologic    Sudden weakness in arms or legs:     Sudden numbness in arms or legs:     Sudden onset of difficulty speaking or slurred speech:    Temporary loss of vision in one eye:     Problems with dizziness:         Gastrointestinal    Blood in stool:     Vomited blood:         Genitourinary    Burning when urinating:     Blood in urine:        Psychiatric    Major depression:         Hematologic    Bleeding problems:    Problems with blood clotting too easily:        Skin    Rashes or ulcers:        Constitutional    Fever or chills:     PHYSICAL EXAM:   Vitals:   04/27/18 1528  BP: 119/68  Pulse: 73  Resp: 20  Temp: 98.7 F (37.1 C)  TempSrc: Oral  SpO2: 96%  Weight: 216 lb (98 kg)  Height: _2  (1.854 m)   GENERAL: The patient is a well-nourished male, in no acute distress. The vital signs are documented above. CARDIAC: There is a regular rate and rhythm.  VASCULAR: I do not detect  carotid bruits. On the right side he has a palpable femoral pulse.  I cannot palpate popliteal or pedal pulses. On the left side, he has a palpable femoral pulse, popliteal pulse and posterior tibial pulse.  I cannot palpate a dorsalis pedis pulse. He has no significant lower extremity swelling. PULMONARY: There is good air exchange bilaterally without wheezing or rales. ABDOMEN: Soft and non-tender with normal pitched bowel sounds.  I cannot palpate an abdominal aortic aneurysm. MUSCULOSKELETAL: He has had amputations of the second fourth and fifth toes in the right foot and a partial amputation of the tip of the right third toe.  On the left side he has had amputations of the second fourth and fifth toes. NEUROLOGIC: No focal weakness or paresthesias are detected. SKIN: On the right foot he has a darkened area over the lateral aspect of the foot which appears to be full-thickness and measures 2 cm x 1 cm.  There is a 1 cm x 1.3 cm wound on the plantar aspect of the foot. On the left foot there is a wound on the plantar aspect  of the foot laterally that measures 1 cm x 1 cm.  On the lateral aspect of the foot there is a wound that is dry that measures 2 cm x 3 cm. PSYCHIATRIC: The patient has a normal affect.  DATA:    ARTERIAL DOPPLER STUDY: I have reviewed the arterial Doppler study that was done on 03/21/2018.  On the left side there is a monophasic anterior tibial and posterior tibial signal.  ABI is 100% although this may be falsely elevated.  Toe pressure on the right is 45 mmHg.  On the right side, there is a monophasic anterior tibial signal with a triphasic posterior tibial signal ABI is 100%.  Toe pressure on the right is 57 mmHg.  ECHO: I reviewed his echo from 03/02/2018.  This showed severe focal basal and moderate concentric hypertrophy of the remaining myocardium.  Systolic function was normal.  Estimated ejection fraction was 55 to 60%.  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  He is undergone aggressive treatment including hyperbaric oxygen therapy.  He has evidence of infrainguinal arterial occlusive disease bilaterally.  I think without revascularization, given his history of infrainguinal disease, diabetes, and renal failure he is at high risk for limb loss.  I think the right side is more concerning given that his toe pressure is 45 mmHg there and he has monophasic signals in the dorsalis pedis and posterior tibial positions.  Thus I think I would favor addressing the right side first if possible.  On the left side he has a toe pressure of 57 mmHg which suggest adequate circulation for healing.  He does have a palpable posterior tibial pulse.  Regardless he still has 2 nonhealing wounds on the left and I think we should study the left side also for future reference.  He dialyzes on Monday Wednesdays and Fridays and we will have to arrange his arteriogram on a Tuesday or Thursday. I have reviewed with the patient the indications for arteriography. In addition, I have reviewed the potential complications of arteriography including but not limited to: Bleeding, arterial injury, arterial thrombosis, dye action, renal insufficiency, or other unpredictable medical problems. I have explained to the patient that if we find disease amenable to angioplasty we could potentially address this at the same time. I have discussed the potential complications of angioplasty and stenting, including but not limited to: Bleeding, arterial thrombosis, arterial injury, dissection, or the need for surgical intervention.  If he is not a candidate for endovascular approach and will require bypass, I think he will require preoperative cardiac clearance and also vein mapping.  We will make further recommendations pending the results of his arteriogram.   Deitra Mayo [facility] [phone]

## 2018-04-27 NOTE — H&P (View-Only) (Signed)
Patient name: [name] BRACCO Sr. MRN: [id number] DOB: Sep 28, 1951 Sex: male   REASON FOR CONSULT:    Peripheral vascular disease.  The consult is requested by Dr. Dorna Leitz.  HPI:   [name] RAFTER Sr. is a pleasant 67 y.o. male who was referred with nonhealing wounds of both feet and peripheral vascular disease.  This patient has had chronic wounds on both feet.  On the right side he developed wounds on the plantar aspect of the foot and toes in December 2018.  On the left side he had wounds beginning in May 2018.  These wounds have never completely healed.  I have reviewed the records that was sent from the referring office.  On 01/27/2018, he underwent right third toe amputation and excisional debridement of a wound on the plantar aspect of the foot.  Also amputation of the left second toe.  The patient was seen on 02/27/2018 and still has some ulceration along the lateral aspect of the left foot.  He had evidence of peripheral vascular disease and ABIs were ordered.  These were abnormal and he was sent for vascular consultation.  Patient is ambulatory but denies any significant claudication.  He denies any rest pain.  He said very aggressive treatment for his wounds including hyperbaric oxygen treatments in the past.  According to the patient, his most recent MRI showed no evidence of osteomyelitis of the right foot.  This was on 04/20/2018.  He dialyzes on Monday Wednesdays and Fridays.  Past Medical History:  Diagnosis Date  . Cardiomyopathy ([place])   . Cataract    right eye  . Diabetes mellitus    type 2  . Diabetic foot ulcer ([place])   . Diabetic infection of right foot ([place])   . Diabetic peripheral neuropathy ([place])   . ESRD (end stage renal disease) ([place])    m-w-f [facility] [address]  . Glaucoma    left eye  . Headache   . HTN (hypertension)   . Osteomyelitis ([place])   . Peripheral vascular disease ([facility])    critical limb ischemia    Family History    Problem Relation Age of Onset  . Heart disease Mother   . Diabetes Father   . Diabetes Brother   . Diabetes Sister   . Diabetes Brother   . Diabetes Brother     SOCIAL HISTORY: Social History   Socioeconomic History  . Marital status: Married    Spouse name: [name]  . Number of children: 3  . Years of education: 5  . Highest education level: Not on file  Occupational History  . Occupation: Printmaker: [employer]  Social Needs  . Financial resource strain: Not on file  . Food insecurity:    Worry: Not on file    Inability: Not on file  . Transportation needs:    Medical: Not on file    Non-medical: Not on file  Tobacco Use  . Smoking status: Former Smoker    Packs/day: 0.50    Years: 4.00    Pack years: 2.00    Types: Cigarettes  . Smokeless tobacco: Never Used  . Tobacco comment: about 40 yrs ago  Substance and Sexual Activity  . Alcohol use: No    Alcohol/week: 0.0 oz  . Drug use: No  . Sexual activity: Yes  Lifestyle  . Physical activity:    Days per week: Not on file    Minutes per session:  Not on file  . Stress: Not on file  Relationships  . Social connections:    Talks on phone: Not on file    Gets together: Not on file    Attends religious service: Not on file    Active member of club or organization: Not on file    Attends meetings of clubs or organizations: Not on file    Relationship status: Not on file  . Intimate partner violence:    Fear of current or ex partner: Not on file    Emotionally abused: Not on file    Physically abused: Not on file    Forced sexual activity: Not on file  Other Topics Concern  . Not on file  Social History Narrative  . Not on file    Allergies  Allergen Reactions  . Penicillins Hives and Swelling    Ankle swelling  PATIENT HAD A PCN REACTION WITH IMMEDIATE RASH, FACIAL/TONGUE/THROAT SWELLING, SOB, OR LIGHTHEADEDNESS WITH HYPOTENSION:  #  #  #  YES  #  #  #   Has patient had a  PCN reaction causing severe rash involving mucus membranes or skin necrosis: no Has patient had a PCN reaction that required hospitalization: no Has patient had a PCN reaction occurring within the last 10 years: no If all of the above answers are "NO", then may proceed with Cephalosporin use.    Current Outpatient Medications  Medication Sig Dispense Refill  . acetaminophen (TYLENOL) 325 MG tablet Take 650 mg by mouth every 6 (six) hours as needed (pain).     . Ascorbic Acid (VITAMIN C PO) Take 1 tablet by mouth daily.    Marland Kitchen atorvastatin (LIPITOR) 20 MG tablet Take 20 mg by mouth every evening.     . carvedilol (COREG) 6.25 MG tablet Take 6.25 mg by mouth every morning.    . cinacalcet (SENSIPAR) 90 MG tablet Take 90 mg by mouth See admin instructions. Given at dialysis    . ferric citrate (AURYXIA) 1 GM 210 MG(Fe) tablet Take 420 mg by mouth 3 (three) times daily with meals.     Marland Kitchen glucose monitoring kit (FREESTYLE) monitoring kit 1 each by Does not apply route 4 (four) times daily - after meals and at bedtime. 1 month Diabetic Testing Supplies for QAC-QHS accuchecks.Any brand OK 1 each 1  . HYDROcodone-acetaminophen (NORCO/VICODIN) 5-325 MG tablet Take 1 tablet by mouth every 6 (six) hours as needed for moderate pain. 30 tablet 0  . insulin aspart (NOVOLOG) 100 UNIT/ML injection Before each meal 3 times a day, 140-199 - 2 units, 200-250 - 4 units, 251-299 - 6 units,  300-349 - 8 units,  350 or above 10 units. Insulin PEN if approved, provide syringes and needles if needed. 10 mL 11  . insulin NPH-regular Human (NOVOLIN 70/30) (70-30) 100 UNIT/ML injection Inject 1-5 Units into the skin 2 (two) times daily with a meal. Sliding Scale    . Insulin Syringe-Needle U-100 25G X 1" 1 ML MISC Any brand, for 4 times a day insulin SQ, 1 month supply. 30 each 0  . Multiple Vitamins-Minerals (MULTIVITAMIN WITH MINERALS) tablet Take 1 tablet by mouth daily.    . multivitamin (RENA-VIT) TABS tablet Take 1  tablet by mouth at bedtime. 30 each 0  . mupirocin ointment (BACTROBAN) 2 % Apply 1 application topically daily as needed (rash on stomach).   0  . primidone (MYSOLINE) 50 MG tablet Take 0.5 tablets (25 mg total) by mouth at bedtime. (Patient  taking differently: Take 50 mg by mouth at bedtime. ) 30 tablet 0  . timolol (TIMOPTIC) 0.5 % ophthalmic solution Place 1 drop into the left eye 2 (two) times daily.     No current facility-administered medications for this visit.     REVIEW OF SYSTEMS:  _0  denotes positive finding, _1  denotes negative finding Cardiac  Comments:  Chest pain or chest pressure:    Shortness of breath upon exertion: x   Short of breath when lying flat:    Irregular heart rhythm:        Vascular    Pain in calf, thigh, or hip brought on by ambulation: x   Pain in feet at night that wakes you up from your sleep:     Blood clot in your veins:    Leg swelling:         Pulmonary    Oxygen at home:    Productive cough:     Wheezing:         Neurologic    Sudden weakness in arms or legs:     Sudden numbness in arms or legs:     Sudden onset of difficulty speaking or slurred speech:    Temporary loss of vision in one eye:     Problems with dizziness:         Gastrointestinal    Blood in stool:     Vomited blood:         Genitourinary    Burning when urinating:     Blood in urine:        Psychiatric    Major depression:         Hematologic    Bleeding problems:    Problems with blood clotting too easily:        Skin    Rashes or ulcers:        Constitutional    Fever or chills:     PHYSICAL EXAM:   Vitals:   04/27/18 1528  BP: 119/68  Pulse: 73  Resp: 20  Temp: 98.7 F (37.1 C)  TempSrc: Oral  SpO2: 96%  Weight: 216 lb (98 kg)  Height: _2  (1.854 m)   GENERAL: The patient is a well-nourished male, in no acute distress. The vital signs are documented above. CARDIAC: There is a regular rate and rhythm.  VASCULAR: I do not detect  carotid bruits. On the right side he has a palpable femoral pulse.  I cannot palpate popliteal or pedal pulses. On the left side, he has a palpable femoral pulse, popliteal pulse and posterior tibial pulse.  I cannot palpate a dorsalis pedis pulse. He has no significant lower extremity swelling. PULMONARY: There is good air exchange bilaterally without wheezing or rales. ABDOMEN: Soft and non-tender with normal pitched bowel sounds.  I cannot palpate an abdominal aortic aneurysm. MUSCULOSKELETAL: He has had amputations of the second fourth and fifth toes in the right foot and a partial amputation of the tip of the right third toe.  On the left side he has had amputations of the second fourth and fifth toes. NEUROLOGIC: No focal weakness or paresthesias are detected. SKIN: On the right foot he has a darkened area over the lateral aspect of the foot which appears to be full-thickness and measures 2 cm x 1 cm.  There is a 1 cm x 1.3 cm wound on the plantar aspect of the foot. On the left foot there is a wound on the plantar aspect  of the foot laterally that measures 1 cm x 1 cm.  On the lateral aspect of the foot there is a wound that is dry that measures 2 cm x 3 cm. PSYCHIATRIC: The patient has a normal affect.  DATA:    ARTERIAL DOPPLER STUDY: I have reviewed the arterial Doppler study that was done on 03/21/2018.  On the left side there is a monophasic anterior tibial and posterior tibial signal.  ABI is 100% although this may be falsely elevated.  Toe pressure on the right is 45 mmHg.  On the right side, there is a monophasic anterior tibial signal with a triphasic posterior tibial signal ABI is 100%.  Toe pressure on the right is 57 mmHg.  ECHO: I reviewed his echo from 03/02/2018.  This showed severe focal basal and moderate concentric hypertrophy of the remaining myocardium.  Systolic function was normal.  Estimated ejection fraction was 55 to 60%.  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  He is undergone aggressive treatment including hyperbaric oxygen therapy.  He has evidence of infrainguinal arterial occlusive disease bilaterally.  I think without revascularization, given his history of infrainguinal disease, diabetes, and renal failure he is at high risk for limb loss.  I think the right side is more concerning given that his toe pressure is 45 mmHg there and he has monophasic signals in the dorsalis pedis and posterior tibial positions.  Thus I think I would favor addressing the right side first if possible.  On the left side he has a toe pressure of 57 mmHg which suggest adequate circulation for healing.  He does have a palpable posterior tibial pulse.  Regardless he still has 2 nonhealing wounds on the left and I think we should study the left side also for future reference.  He dialyzes on Monday Wednesdays and Fridays and we will have to arrange his arteriogram on a Tuesday or Thursday. I have reviewed with the patient the indications for arteriography. In addition, I have reviewed the potential complications of arteriography including but not limited to: Bleeding, arterial injury, arterial thrombosis, dye action, renal insufficiency, or other unpredictable medical problems. I have explained to the patient that if we find disease amenable to angioplasty we could potentially address this at the same time. I have discussed the potential complications of angioplasty and stenting, including but not limited to: Bleeding, arterial thrombosis, arterial injury, dissection, or the need for surgical intervention.  If he is not a candidate for endovascular approach and will require bypass, I think he will require preoperative cardiac clearance and also vein mapping.  We will make further recommendations pending the results of his arteriogram.   Deitra Mayo [facility] [phone]

## 2018-04-27 NOTE — H&P (View-Only) (Signed)
Patient name: [name] BRACCO Sr. MRN: [id number] DOB: Sep 28, 1951 Sex: male   REASON FOR CONSULT:    Peripheral vascular disease.  The consult is requested by Dr. Dorna Leitz.  HPI:   [name] RAFTER Sr. is a pleasant 67 y.o. male who was referred with nonhealing wounds of both feet and peripheral vascular disease.  This patient has had chronic wounds on both feet.  On the right side he developed wounds on the plantar aspect of the foot and toes in December 2018.  On the left side he had wounds beginning in May 2018.  These wounds have never completely healed.  I have reviewed the records that was sent from the referring office.  On 01/27/2018, he underwent right third toe amputation and excisional debridement of a wound on the plantar aspect of the foot.  Also amputation of the left second toe.  The patient was seen on 02/27/2018 and still has some ulceration along the lateral aspect of the left foot.  He had evidence of peripheral vascular disease and ABIs were ordered.  These were abnormal and he was sent for vascular consultation.  Patient is ambulatory but denies any significant claudication.  He denies any rest pain.  He said very aggressive treatment for his wounds including hyperbaric oxygen treatments in the past.  According to the patient, his most recent MRI showed no evidence of osteomyelitis of the right foot.  This was on 04/20/2018.  He dialyzes on Monday Wednesdays and Fridays.  Past Medical History:  Diagnosis Date  . Cardiomyopathy ([place])   . Cataract    right eye  . Diabetes mellitus    type 2  . Diabetic foot ulcer ([place])   . Diabetic infection of right foot ([place])   . Diabetic peripheral neuropathy ([place])   . ESRD (end stage renal disease) ([place])    m-w-f [facility] [address]  . Glaucoma    left eye  . Headache   . HTN (hypertension)   . Osteomyelitis ([place])   . Peripheral vascular disease ([facility])    critical limb ischemia    Family History    Problem Relation Age of Onset  . Heart disease Mother   . Diabetes Father   . Diabetes Brother   . Diabetes Sister   . Diabetes Brother   . Diabetes Brother     SOCIAL HISTORY: Social History   Socioeconomic History  . Marital status: Married    Spouse name: [name]  . Number of children: 3  . Years of education: 5  . Highest education level: Not on file  Occupational History  . Occupation: Printmaker: [employer]  Social Needs  . Financial resource strain: Not on file  . Food insecurity:    Worry: Not on file    Inability: Not on file  . Transportation needs:    Medical: Not on file    Non-medical: Not on file  Tobacco Use  . Smoking status: Former Smoker    Packs/day: 0.50    Years: 4.00    Pack years: 2.00    Types: Cigarettes  . Smokeless tobacco: Never Used  . Tobacco comment: about 40 yrs ago  Substance and Sexual Activity  . Alcohol use: No    Alcohol/week: 0.0 oz  . Drug use: No  . Sexual activity: Yes  Lifestyle  . Physical activity:    Days per week: Not on file    Minutes per session:  Not on file  . Stress: Not on file  Relationships  . Social connections:    Talks on phone: Not on file    Gets together: Not on file    Attends religious service: Not on file    Active member of club or organization: Not on file    Attends meetings of clubs or organizations: Not on file    Relationship status: Not on file  . Intimate partner violence:    Fear of current or ex partner: Not on file    Emotionally abused: Not on file    Physically abused: Not on file    Forced sexual activity: Not on file  Other Topics Concern  . Not on file  Social History Narrative  . Not on file    Allergies  Allergen Reactions  . Penicillins Hives and Swelling    Ankle swelling  PATIENT HAD A PCN REACTION WITH IMMEDIATE RASH, FACIAL/TONGUE/THROAT SWELLING, SOB, OR LIGHTHEADEDNESS WITH HYPOTENSION:  #  #  #  YES  #  #  #   Has patient had a  PCN reaction causing severe rash involving mucus membranes or skin necrosis: no Has patient had a PCN reaction that required hospitalization: no Has patient had a PCN reaction occurring within the last 10 years: no If all of the above answers are "NO", then may proceed with Cephalosporin use.    Current Outpatient Medications  Medication Sig Dispense Refill  . acetaminophen (TYLENOL) 325 MG tablet Take 650 mg by mouth every 6 (six) hours as needed (pain).     . Ascorbic Acid (VITAMIN C PO) Take 1 tablet by mouth daily.    Marland Kitchen atorvastatin (LIPITOR) 20 MG tablet Take 20 mg by mouth every evening.     . carvedilol (COREG) 6.25 MG tablet Take 6.25 mg by mouth every morning.    . cinacalcet (SENSIPAR) 90 MG tablet Take 90 mg by mouth See admin instructions. Given at dialysis    . ferric citrate (AURYXIA) 1 GM 210 MG(Fe) tablet Take 420 mg by mouth 3 (three) times daily with meals.     Marland Kitchen glucose monitoring kit (FREESTYLE) monitoring kit 1 each by Does not apply route 4 (four) times daily - after meals and at bedtime. 1 month Diabetic Testing Supplies for QAC-QHS accuchecks.Any brand OK 1 each 1  . HYDROcodone-acetaminophen (NORCO/VICODIN) 5-325 MG tablet Take 1 tablet by mouth every 6 (six) hours as needed for moderate pain. 30 tablet 0  . insulin aspart (NOVOLOG) 100 UNIT/ML injection Before each meal 3 times a day, 140-199 - 2 units, 200-250 - 4 units, 251-299 - 6 units,  300-349 - 8 units,  350 or above 10 units. Insulin PEN if approved, provide syringes and needles if needed. 10 mL 11  . insulin NPH-regular Human (NOVOLIN 70/30) (70-30) 100 UNIT/ML injection Inject 1-5 Units into the skin 2 (two) times daily with a meal. Sliding Scale    . Insulin Syringe-Needle U-100 25G X 1" 1 ML MISC Any brand, for 4 times a day insulin SQ, 1 month supply. 30 each 0  . Multiple Vitamins-Minerals (MULTIVITAMIN WITH MINERALS) tablet Take 1 tablet by mouth daily.    . multivitamin (RENA-VIT) TABS tablet Take 1  tablet by mouth at bedtime. 30 each 0  . mupirocin ointment (BACTROBAN) 2 % Apply 1 application topically daily as needed (rash on stomach).   0  . primidone (MYSOLINE) 50 MG tablet Take 0.5 tablets (25 mg total) by mouth at bedtime. (Patient  taking differently: Take 50 mg by mouth at bedtime. ) 30 tablet 0  . timolol (TIMOPTIC) 0.5 % ophthalmic solution Place 1 drop into the left eye 2 (two) times daily.     No current facility-administered medications for this visit.     REVIEW OF SYSTEMS:  _0  denotes positive finding, _1  denotes negative finding Cardiac  Comments:  Chest pain or chest pressure:    Shortness of breath upon exertion: x   Short of breath when lying flat:    Irregular heart rhythm:        Vascular    Pain in calf, thigh, or hip brought on by ambulation: x   Pain in feet at night that wakes you up from your sleep:     Blood clot in your veins:    Leg swelling:         Pulmonary    Oxygen at home:    Productive cough:     Wheezing:         Neurologic    Sudden weakness in arms or legs:     Sudden numbness in arms or legs:     Sudden onset of difficulty speaking or slurred speech:    Temporary loss of vision in one eye:     Problems with dizziness:         Gastrointestinal    Blood in stool:     Vomited blood:         Genitourinary    Burning when urinating:     Blood in urine:        Psychiatric    Major depression:         Hematologic    Bleeding problems:    Problems with blood clotting too easily:        Skin    Rashes or ulcers:        Constitutional    Fever or chills:     PHYSICAL EXAM:   Vitals:   04/27/18 1528  BP: 119/68  Pulse: 73  Resp: 20  Temp: 98.7 F (37.1 C)  TempSrc: Oral  SpO2: 96%  Weight: 216 lb (98 kg)  Height: _2  (1.854 m)   GENERAL: The patient is a well-nourished male, in no acute distress. The vital signs are documented above. CARDIAC: There is a regular rate and rhythm.  VASCULAR: I do not detect  carotid bruits. On the right side he has a palpable femoral pulse.  I cannot palpate popliteal or pedal pulses. On the left side, he has a palpable femoral pulse, popliteal pulse and posterior tibial pulse.  I cannot palpate a dorsalis pedis pulse. He has no significant lower extremity swelling. PULMONARY: There is good air exchange bilaterally without wheezing or rales. ABDOMEN: Soft and non-tender with normal pitched bowel sounds.  I cannot palpate an abdominal aortic aneurysm. MUSCULOSKELETAL: He has had amputations of the second fourth and fifth toes in the right foot and a partial amputation of the tip of the right third toe.  On the left side he has had amputations of the second fourth and fifth toes. NEUROLOGIC: No focal weakness or paresthesias are detected. SKIN: On the right foot he has a darkened area over the lateral aspect of the foot which appears to be full-thickness and measures 2 cm x 1 cm.  There is a 1 cm x 1.3 cm wound on the plantar aspect of the foot. On the left foot there is a wound on the plantar aspect  of the foot laterally that measures 1 cm x 1 cm.  On the lateral aspect of the foot there is a wound that is dry that measures 2 cm x 3 cm. PSYCHIATRIC: The patient has a normal affect.  DATA:    ARTERIAL DOPPLER STUDY: I have reviewed the arterial Doppler study that was done on 03/21/2018.  On the left side there is a monophasic anterior tibial and posterior tibial signal.  ABI is 100% although this may be falsely elevated.  Toe pressure on the right is 45 mmHg.  On the right side, there is a monophasic anterior tibial signal with a triphasic posterior tibial signal ABI is 100%.  Toe pressure on the right is 57 mmHg.  ECHO: I reviewed his echo from 03/02/2018.  This showed severe focal basal and moderate concentric hypertrophy of the remaining myocardium.  Systolic function was normal.  Estimated ejection fraction was 55 to 60%.  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  He is undergone aggressive treatment including hyperbaric oxygen therapy.  He has evidence of infrainguinal arterial occlusive disease bilaterally.  I think without revascularization, given his history of infrainguinal disease, diabetes, and renal failure he is at high risk for limb loss.  I think the right side is more concerning given that his toe pressure is 45 mmHg there and he has monophasic signals in the dorsalis pedis and posterior tibial positions.  Thus I think I would favor addressing the right side first if possible.  On the left side he has a toe pressure of 57 mmHg which suggest adequate circulation for healing.  He does have a palpable posterior tibial pulse.  Regardless he still has 2 nonhealing wounds on the left and I think we should study the left side also for future reference.  He dialyzes on Monday Wednesdays and Fridays and we will have to arrange his arteriogram on a Tuesday or Thursday. I have reviewed with the patient the indications for arteriography. In addition, I have reviewed the potential complications of arteriography including but not limited to: Bleeding, arterial injury, arterial thrombosis, dye action, renal insufficiency, or other unpredictable medical problems. I have explained to the patient that if we find disease amenable to angioplasty we could potentially address this at the same time. I have discussed the potential complications of angioplasty and stenting, including but not limited to: Bleeding, arterial thrombosis, arterial injury, dissection, or the need for surgical intervention.  If he is not a candidate for endovascular approach and will require bypass, I think he will require preoperative cardiac clearance and also vein mapping.  We will make further recommendations pending the results of his arteriogram.   Deitra Mayo [facility] [phone]

## 2018-04-28 DIAGNOSIS — D509 Iron deficiency anemia, unspecified: Secondary | ICD-10-CM | POA: Diagnosis not present

## 2018-04-28 DIAGNOSIS — Z992 Dependence on renal dialysis: Secondary | ICD-10-CM | POA: Diagnosis not present

## 2018-04-28 DIAGNOSIS — N186 End stage renal disease: Secondary | ICD-10-CM | POA: Diagnosis not present

## 2018-04-28 DIAGNOSIS — N2581 Secondary hyperparathyroidism of renal origin: Secondary | ICD-10-CM | POA: Diagnosis not present

## 2018-04-28 DIAGNOSIS — M869 Osteomyelitis, unspecified: Secondary | ICD-10-CM | POA: Diagnosis not present

## 2018-04-28 DIAGNOSIS — E1121 Type 2 diabetes mellitus with diabetic nephropathy: Secondary | ICD-10-CM | POA: Diagnosis not present

## 2018-05-01 DIAGNOSIS — E1121 Type 2 diabetes mellitus with diabetic nephropathy: Secondary | ICD-10-CM | POA: Diagnosis not present

## 2018-05-01 DIAGNOSIS — N186 End stage renal disease: Secondary | ICD-10-CM | POA: Diagnosis not present

## 2018-05-01 DIAGNOSIS — N2581 Secondary hyperparathyroidism of renal origin: Secondary | ICD-10-CM | POA: Diagnosis not present

## 2018-05-01 DIAGNOSIS — Z992 Dependence on renal dialysis: Secondary | ICD-10-CM | POA: Diagnosis not present

## 2018-05-01 DIAGNOSIS — M869 Osteomyelitis, unspecified: Secondary | ICD-10-CM | POA: Diagnosis not present

## 2018-05-01 DIAGNOSIS — D509 Iron deficiency anemia, unspecified: Secondary | ICD-10-CM | POA: Diagnosis not present

## 2018-05-02 ENCOUNTER — Other Ambulatory Visit: Payer: Self-pay | Admitting: *Deleted

## 2018-05-02 ENCOUNTER — Encounter (HOSPITAL_BASED_OUTPATIENT_CLINIC_OR_DEPARTMENT_OTHER): Payer: Medicare Other | Attending: Internal Medicine

## 2018-05-02 DIAGNOSIS — N186 End stage renal disease: Secondary | ICD-10-CM | POA: Insufficient documentation

## 2018-05-02 DIAGNOSIS — Z992 Dependence on renal dialysis: Secondary | ICD-10-CM | POA: Diagnosis not present

## 2018-05-02 DIAGNOSIS — E11621 Type 2 diabetes mellitus with foot ulcer: Secondary | ICD-10-CM | POA: Diagnosis not present

## 2018-05-02 DIAGNOSIS — E1140 Type 2 diabetes mellitus with diabetic neuropathy, unspecified: Secondary | ICD-10-CM | POA: Insufficient documentation

## 2018-05-02 DIAGNOSIS — L97422 Non-pressure chronic ulcer of left heel and midfoot with fat layer exposed: Secondary | ICD-10-CM | POA: Insufficient documentation

## 2018-05-02 DIAGNOSIS — L97512 Non-pressure chronic ulcer of other part of right foot with fat layer exposed: Secondary | ICD-10-CM | POA: Insufficient documentation

## 2018-05-02 DIAGNOSIS — I132 Hypertensive heart and chronic kidney disease with heart failure and with stage 5 chronic kidney disease, or end stage renal disease: Secondary | ICD-10-CM | POA: Diagnosis not present

## 2018-05-02 DIAGNOSIS — L97522 Non-pressure chronic ulcer of other part of left foot with fat layer exposed: Secondary | ICD-10-CM | POA: Insufficient documentation

## 2018-05-02 DIAGNOSIS — I509 Heart failure, unspecified: Secondary | ICD-10-CM | POA: Insufficient documentation

## 2018-05-02 DIAGNOSIS — E1122 Type 2 diabetes mellitus with diabetic chronic kidney disease: Secondary | ICD-10-CM | POA: Diagnosis not present

## 2018-05-03 DIAGNOSIS — E1121 Type 2 diabetes mellitus with diabetic nephropathy: Secondary | ICD-10-CM | POA: Diagnosis not present

## 2018-05-03 DIAGNOSIS — Z992 Dependence on renal dialysis: Secondary | ICD-10-CM | POA: Diagnosis not present

## 2018-05-03 DIAGNOSIS — N186 End stage renal disease: Secondary | ICD-10-CM | POA: Diagnosis not present

## 2018-05-03 DIAGNOSIS — N2581 Secondary hyperparathyroidism of renal origin: Secondary | ICD-10-CM | POA: Diagnosis not present

## 2018-05-03 DIAGNOSIS — D509 Iron deficiency anemia, unspecified: Secondary | ICD-10-CM | POA: Diagnosis not present

## 2018-05-03 DIAGNOSIS — M869 Osteomyelitis, unspecified: Secondary | ICD-10-CM | POA: Diagnosis not present

## 2018-05-04 ENCOUNTER — Ambulatory Visit (HOSPITAL_COMMUNITY)
Admission: RE | Admit: 2018-05-04 | Discharge: 2018-05-04 | Disposition: A | Discharge status: Home / Self Care | Payer: Medicare Other | Source: Ambulatory Visit | Attending: Vascular Surgery | Admitting: Vascular Surgery

## 2018-05-04 ENCOUNTER — Ambulatory Visit (HOSPITAL_COMMUNITY)
Admission: RE | Disposition: A | Discharge status: Home / Self Care | Payer: Self-pay | Source: Ambulatory Visit | Attending: Vascular Surgery

## 2018-05-04 DIAGNOSIS — E1142 Type 2 diabetes mellitus with diabetic polyneuropathy: Secondary | ICD-10-CM | POA: Insufficient documentation

## 2018-05-04 DIAGNOSIS — Z992 Dependence on renal dialysis: Secondary | ICD-10-CM | POA: Insufficient documentation

## 2018-05-04 DIAGNOSIS — I120 Hypertensive chronic kidney disease with stage 5 chronic kidney disease or end stage renal disease: Secondary | ICD-10-CM | POA: Insufficient documentation

## 2018-05-04 DIAGNOSIS — E1122 Type 2 diabetes mellitus with diabetic chronic kidney disease: Secondary | ICD-10-CM | POA: Diagnosis not present

## 2018-05-04 DIAGNOSIS — E1151 Type 2 diabetes mellitus with diabetic peripheral angiopathy without gangrene: Secondary | ICD-10-CM | POA: Diagnosis not present

## 2018-05-04 DIAGNOSIS — Z89421 Acquired absence of other right toe(s): Secondary | ICD-10-CM | POA: Diagnosis not present

## 2018-05-04 DIAGNOSIS — N186 End stage renal disease: Secondary | ICD-10-CM | POA: Diagnosis not present

## 2018-05-04 DIAGNOSIS — I70245 Atherosclerosis of native arteries of left leg with ulceration of other part of foot: Secondary | ICD-10-CM | POA: Diagnosis not present

## 2018-05-04 DIAGNOSIS — L97529 Non-pressure chronic ulcer of other part of left foot with unspecified severity: Secondary | ICD-10-CM | POA: Insufficient documentation

## 2018-05-04 DIAGNOSIS — Z794 Long term (current) use of insulin: Secondary | ICD-10-CM | POA: Insufficient documentation

## 2018-05-04 DIAGNOSIS — E11621 Type 2 diabetes mellitus with foot ulcer: Secondary | ICD-10-CM | POA: Insufficient documentation

## 2018-05-04 DIAGNOSIS — Z87891 Personal history of nicotine dependence: Secondary | ICD-10-CM | POA: Insufficient documentation

## 2018-05-04 DIAGNOSIS — H409 Unspecified glaucoma: Secondary | ICD-10-CM | POA: Diagnosis not present

## 2018-05-04 DIAGNOSIS — Z79899 Other long term (current) drug therapy: Secondary | ICD-10-CM | POA: Diagnosis not present

## 2018-05-04 DIAGNOSIS — I70235 Atherosclerosis of native arteries of right leg with ulceration of other part of foot: Secondary | ICD-10-CM | POA: Diagnosis not present

## 2018-05-04 DIAGNOSIS — I429 Cardiomyopathy, unspecified: Secondary | ICD-10-CM | POA: Insufficient documentation

## 2018-05-04 DIAGNOSIS — Z89512 Acquired absence of left leg below knee: Secondary | ICD-10-CM | POA: Diagnosis present

## 2018-05-04 DIAGNOSIS — Z880 Allergy status to penicillin: Secondary | ICD-10-CM | POA: Diagnosis not present

## 2018-05-04 DIAGNOSIS — Z8249 Family history of ischemic heart disease and other diseases of the circulatory system: Secondary | ICD-10-CM | POA: Diagnosis not present

## 2018-05-04 DIAGNOSIS — L97519 Non-pressure chronic ulcer of other part of right foot with unspecified severity: Secondary | ICD-10-CM | POA: Diagnosis not present

## 2018-05-04 DIAGNOSIS — Z833 Family history of diabetes mellitus: Secondary | ICD-10-CM | POA: Insufficient documentation

## 2018-05-04 HISTORY — PX: LOWER EXTREMITY ANGIOGRAPHY: CATH118251

## 2018-05-04 HISTORY — PX: ABDOMINAL AORTOGRAM: CATH118222

## 2018-05-04 LAB — POCT I-STAT, CHEM 8
BUN: 31 mg/dL — ABNORMAL HIGH (ref 6–20)
CREATININE: 8.2 mg/dL — AB (ref 0.61–1.24)
Calcium, Ion: 0.89 mmol/L — CL (ref 1.15–1.40)
Chloride: 95 mmol/L — ABNORMAL LOW (ref 101–111)
GLUCOSE: 135 mg/dL — AB (ref 65–99)
HEMATOCRIT: 41 % (ref 39.0–52.0)
Hemoglobin: 13.9 g/dL (ref 13.0–17.0)
Potassium: 4.5 mmol/L (ref 3.5–5.1)
Sodium: 137 mmol/L (ref 135–145)
TCO2: 30 mmol/L (ref 22–32)

## 2018-05-04 LAB — GLUCOSE, CAPILLARY: GLUCOSE-CAPILLARY: 108 mg/dL — AB (ref 65–99)

## 2018-05-04 SURGERY — ABDOMINAL AORTOGRAM
Anesthesia: LOCAL

## 2018-05-04 MED ORDER — SODIUM CHLORIDE 0.9% FLUSH: 3.0000 mL | INTRAVENOUS | Status: DC | PRN

## 2018-05-04 MED ORDER — SODIUM CHLORIDE 0.9 % IV SOLN: 250.0000 mL | INTRAVENOUS | Status: DC | PRN

## 2018-05-04 MED ORDER — MIDAZOLAM HCL 2 MG/2ML IJ SOLN
INTRAMUSCULAR | Status: DC | PRN
  Administered 2018-05-04: 1 mg via INTRAVENOUS

## 2018-05-04 MED ORDER — LIDOCAINE HCL (PF) 1 % IJ SOLN
INTRAMUSCULAR | Status: AC
  Filled 2018-05-04: qty 30

## 2018-05-04 MED ORDER — FENTANYL CITRATE (PF) 100 MCG/2ML IJ SOLN
INTRAMUSCULAR | Status: DC | PRN
  Administered 2018-05-04: 50 ug via INTRAVENOUS

## 2018-05-04 MED ORDER — LABETALOL HCL 5 MG/ML IV SOLN: 10.0000 mg | INTRAVENOUS | Status: DC | PRN

## 2018-05-04 MED ORDER — MORPHINE SULFATE (PF) 10 MG/ML IV SOLN: 2.0000 mg | INTRAVENOUS | Status: DC | PRN

## 2018-05-04 MED ORDER — SODIUM CHLORIDE 0.9% FLUSH: 3.0000 mL | Freq: Two times a day (BID) | INTRAVENOUS | Status: DC

## 2018-05-04 MED ORDER — IODIXANOL 320 MG/ML IV SOLN
INTRAVENOUS | Status: DC | PRN
  Administered 2018-05-04: 115 mL via INTRAVENOUS

## 2018-05-04 MED ORDER — MIDAZOLAM HCL 2 MG/2ML IJ SOLN
INTRAMUSCULAR | Status: AC
  Filled 2018-05-04: qty 2

## 2018-05-04 MED ORDER — NITROGLYCERIN 1 MG/10 ML FOR IR/CATH LAB: INTRA_ARTERIAL | Status: DC | PRN

## 2018-05-04 MED ORDER — HYDRALAZINE HCL 20 MG/ML IJ SOLN: 5.0000 mg | INTRAMUSCULAR | Status: DC | PRN

## 2018-05-04 MED ORDER — LIDOCAINE HCL (PF) 1 % IJ SOLN
INTRAMUSCULAR | Status: DC | PRN
  Administered 2018-05-04: 15 mL

## 2018-05-04 MED ORDER — ACETAMINOPHEN 325 MG PO TABS: 650.0000 mg | ORAL_TABLET | ORAL | Status: DC | PRN

## 2018-05-04 MED ORDER — OXYCODONE HCL 5 MG PO TABS: 5.0000 mg | ORAL_TABLET | ORAL | Status: DC | PRN

## 2018-05-04 MED ORDER — HEPARIN (PORCINE) IN NACL 1000-0.9 UT/500ML-% IV SOLN
INTRAVENOUS | Status: AC
  Filled 2018-05-04: qty 1000

## 2018-05-04 MED ORDER — ONDANSETRON HCL 4 MG/2ML IJ SOLN
4.0000 mg | Freq: Four times a day (QID) | INTRAMUSCULAR | Status: DC | PRN
Start: 2018-05-04 — End: 2018-05-04

## 2018-05-04 MED ORDER — FENTANYL CITRATE (PF) 100 MCG/2ML IJ SOLN
INTRAMUSCULAR | Status: AC
  Filled 2018-05-04: qty 2

## 2018-05-04 MED ORDER — HEPARIN (PORCINE) IN NACL 2-0.9 UNITS/ML
INTRAMUSCULAR | Status: AC | PRN
  Administered 2018-05-04 (×2): 500 mL

## 2018-05-04 SURGICAL SUPPLY — 9 items
CATH OMNI FLUSH 5F 65CM (CATHETERS) ×3 IMPLANT
COVER PRB 48X5XTLSCP FOLD TPE (BAG) ×2 IMPLANT
COVER PROBE 5X48 (BAG) ×1
KIT PV (KITS) ×3 IMPLANT
SHEATH PINNACLE 5F 10CM (SHEATH) ×3 IMPLANT
SYR MEDRAD MARK V 150ML (SYRINGE) ×3 IMPLANT
TRANSDUCER W/STOPCOCK (MISCELLANEOUS) ×3 IMPLANT
TRAY PV CATH (CUSTOM PROCEDURE TRAY) ×3 IMPLANT
WIRE BENTSON .035X145CM (WIRE) ×3 IMPLANT

## 2018-05-04 NOTE — Op Note (Signed)
OPERATIVE NOTE   PROCEDURE: 1.  left common femoral artery cannulation under ultrasound guidance 2.  Placement of catheter in aorta 3.  Aortogram 4.  Conscious sedation for 27 min 5.  Second order arterial selection 6.  right leg runoff via catheter 7.  left leg runoff via sheath  PRE-OPERATIVE DIAGNOSIS: bilateral foot ulcers  POST-OPERATIVE DIAGNOSIS: same as above   SURGEON: Adele Barthel, MD  ANESTHESIA: conscious sedation  ESTIMATED BLOOD LOSS: 50 cc  CONTRAST: 115 cc  FINDING(S):  Aorta: patent  Superior mesenteric artery: patent Celiac artery: not visualized   Right Left  RA Miniscule, tapers distally, no nephrogram Patent, no nephrogram  CIA Patent Patent  EIA Patent Patent  IIA Patent Patent  CFA Patent Patent  SFA Patent Patent  PFA Patent Patent  Pop Patent Patent  Trif Patent Patent, focal sub-total occlusion in tibioperoneal trunk  AT Patent proximally, occludes shortly after take off Patent proximally, occludes shortly after take off    Pero Patent, non-dominant runoff Patent, non-dominant runoff  PT Patent, dominant runoff Patent, dominant runoff   SPECIMEN(S):  none  INDICATIONS:   [name] ADORNO Sr. is a 67 y.o. male who presents with bilateral foot ulcer.  The patient presents for: aortogram, bilateral leg runoff and possible right leg intervention per Dr. Scot Dock.  I discussed with the patient the nature of angiographic procedures, especially the limited patencies of any endovascular intervention.  I discussed with the patient the nature of angiographic procedures, especially the limited patencies of any endovascular intervention.  The patient is aware of that the risks of an angiographic procedure include but are not limited to: bleeding, infection, access site complications, renal failure, embolization, rupture of vessel, dissection, arteriovenous fistula, possible need for emergent surgical intervention, possible need for surgical procedures to  treat the patient's pathology, anaphylactic reaction to contrast, and stroke and death.  The patient is aware of the risks and agrees to proceed.    DESCRIPTION: After full informed consent was obtained from the patient, the patient was brought back to the angiography suite.  The patient was placed supine upon the angiography table and connected to cardiopulmonary monitoring equipment.  The patient was then given conscious sedation, the amounts of which are documented in the patient's chart.  The patient was prepped and drape in the standard fashion for an angiographic procedure.  At this point, attention was turned to the left groin.  Under ultrasound guidance,  The subcutaneous tissue surrounding the left common femoral artery was anesthesized with 1% lidocaine with epinephrine.  Under Sonosite guidance, the patency of the artery was noted to be: patent with calcified anterior wall.  Utrasound image was permanently recorded.  The artery was then cannulated with a 18 gauge needle.  The Bentson wire was passed up into the aorta.  The needle was exchanged for a 5-Fr sheath, which was advanced over the wire into the common femoral artery.  The dilator was then removed.  The Omniflush catheter was then loaded over the wire up to the level of L1.  The catheter was connected to the power injector circuit.  After de-airring and de-clotting the circuit, a power injector aortogram was completed.  The findings are listed above.  The Bentson wire was replaced in the catheter, and using the [facility] and Omniflush catheter, the right common iliac artery was selected.  The catheter and wire were advanced into the external iliac artery.  An automated right leg runoff was completed after de-airring and de-clotting  the circuit.  The findings are as listed above.    The Bentson wire was replaced in the catheter to straighten the crook of the catheter.  Both were removed together from the sheath.  The left sheath was  aspirated and no clot was present.  The sheath was connected to the power injector circuit.  An automated left leg runoff was completed after de-airring and de-clotting the circuit.  The findings are as listed above.  The sheath was aspirated.  No clots were present and the sheath was reloaded with heparinized saline.    Based on the images, this patient needs: attempt at Left tibioperoneal trunk angioplasty via a right femoral approach.   COMPLICATIONS: none  CONDITION: stable   Adele Barthel, MD, [facility] [place] Office: [phone] Pager: [phone]  05/04/2018, 1:39 PM

## 2018-05-04 NOTE — Progress Notes (Signed)
62fr sheath aspirated and removed from  LFA by Casilda Carls R.N.  Manual pressure applied for 20 minutes. Groin level 0 tegaderm dressing applied. Bilateral dp and pt pulses present with doppler.    Bedrest begins at 13:50:00

## 2018-05-04 NOTE — Discharge Instructions (Signed)

## 2018-05-04 NOTE — Interval H&P Note (Signed)
   History and Physical Update  The patient was interviewed and re-examined.  The patient's previous History and Physical has been reviewed and is unchanged from Dr. Nicole Cella consult.  There is no change in the plan of care: aortogram, bilateral leg runoff, and possible right leg intervention.   I discussed with the patient the nature of angiographic procedures, especially the limited patencies of any endovascular intervention.    The patient is aware of that the risks of an angiographic procedure include but are not limited to: bleeding, infection, access site complications, renal failure, embolization, rupture of vessel, dissection, arteriovenous fistula, possible need for emergent surgical intervention, possible need for surgical procedures to treat the patient's pathology, anaphylactic reaction to contrast, and stroke and death.    The patient is aware of the risks and agrees to proceed.   Adele Barthel, MD, FACS [facility] [place] Office: [phone] Pager: [phone]  05/04/2018, 11:41 AM

## 2018-05-05 ENCOUNTER — Encounter (HOSPITAL_COMMUNITY): Payer: Self-pay | Admitting: Vascular Surgery

## 2018-05-05 ENCOUNTER — Telehealth: Payer: Self-pay | Admitting: *Deleted

## 2018-05-05 ENCOUNTER — Encounter: Payer: Self-pay | Admitting: *Deleted

## 2018-05-05 ENCOUNTER — Other Ambulatory Visit: Payer: Self-pay | Admitting: *Deleted

## 2018-05-05 DIAGNOSIS — N2581 Secondary hyperparathyroidism of renal origin: Secondary | ICD-10-CM | POA: Diagnosis not present

## 2018-05-05 DIAGNOSIS — D509 Iron deficiency anemia, unspecified: Secondary | ICD-10-CM | POA: Diagnosis not present

## 2018-05-05 DIAGNOSIS — M869 Osteomyelitis, unspecified: Secondary | ICD-10-CM | POA: Diagnosis not present

## 2018-05-05 DIAGNOSIS — Z992 Dependence on renal dialysis: Secondary | ICD-10-CM | POA: Diagnosis not present

## 2018-05-05 DIAGNOSIS — N186 End stage renal disease: Secondary | ICD-10-CM | POA: Diagnosis not present

## 2018-05-05 DIAGNOSIS — E1121 Type 2 diabetes mellitus with diabetic nephropathy: Secondary | ICD-10-CM | POA: Diagnosis not present

## 2018-05-05 NOTE — Telephone Encounter (Signed)
Spoke with [place] at [facility]. She will review pr-procedure instruction letter with patient today when he is in for HD.

## 2018-05-05 NOTE — Telephone Encounter (Signed)
Call to patient and confirmed he received instruction letter at Dialysis today. He has no questions.

## 2018-05-08 DIAGNOSIS — Z992 Dependence on renal dialysis: Secondary | ICD-10-CM | POA: Diagnosis not present

## 2018-05-08 DIAGNOSIS — N2581 Secondary hyperparathyroidism of renal origin: Secondary | ICD-10-CM | POA: Diagnosis not present

## 2018-05-08 DIAGNOSIS — D509 Iron deficiency anemia, unspecified: Secondary | ICD-10-CM | POA: Diagnosis not present

## 2018-05-08 DIAGNOSIS — E1121 Type 2 diabetes mellitus with diabetic nephropathy: Secondary | ICD-10-CM | POA: Diagnosis not present

## 2018-05-08 DIAGNOSIS — N186 End stage renal disease: Secondary | ICD-10-CM | POA: Diagnosis not present

## 2018-05-08 DIAGNOSIS — M869 Osteomyelitis, unspecified: Secondary | ICD-10-CM | POA: Diagnosis not present

## 2018-05-09 ENCOUNTER — Other Ambulatory Visit (HOSPITAL_COMMUNITY)
Admit: 2018-05-09 | Discharge: 2018-05-09 | Disposition: A | Discharge status: Home / Self Care | Payer: Medicare Other | Source: Ambulatory Visit | Attending: Internal Medicine | Admitting: Internal Medicine

## 2018-05-09 DIAGNOSIS — R509 Fever, unspecified: Secondary | ICD-10-CM | POA: Diagnosis not present

## 2018-05-09 DIAGNOSIS — E11621 Type 2 diabetes mellitus with foot ulcer: Secondary | ICD-10-CM | POA: Diagnosis not present

## 2018-05-09 DIAGNOSIS — L089 Local infection of the skin and subcutaneous tissue, unspecified: Secondary | ICD-10-CM | POA: Diagnosis not present

## 2018-05-09 DIAGNOSIS — L97522 Non-pressure chronic ulcer of other part of left foot with fat layer exposed: Secondary | ICD-10-CM | POA: Diagnosis not present

## 2018-05-09 DIAGNOSIS — A419 Sepsis, unspecified organism: Secondary | ICD-10-CM | POA: Diagnosis not present

## 2018-05-09 MED FILL — Heparin Sod (Porcine)-NaCl IV Soln 1000 Unit/500ML-0.9%: INTRAVENOUS | Qty: 1000 | Status: AC

## 2018-05-10 ENCOUNTER — Encounter (HOSPITAL_COMMUNITY): Payer: Self-pay

## 2018-05-10 ENCOUNTER — Emergency Department (HOSPITAL_COMMUNITY): Payer: Medicare Other

## 2018-05-10 ENCOUNTER — Inpatient Hospital Stay (HOSPITAL_COMMUNITY)
Admission: EM | Admit: 2018-05-10 | Discharge: 2018-05-14 | DRG: 853 | Disposition: A | Discharge status: Home Health | Payer: Medicare Other | Attending: Internal Medicine | Admitting: Internal Medicine

## 2018-05-10 DIAGNOSIS — Z833 Family history of diabetes mellitus: Secondary | ICD-10-CM

## 2018-05-10 DIAGNOSIS — I739 Peripheral vascular disease, unspecified: Secondary | ICD-10-CM | POA: Diagnosis present

## 2018-05-10 DIAGNOSIS — Z8249 Family history of ischemic heart disease and other diseases of the circulatory system: Secondary | ICD-10-CM | POA: Diagnosis not present

## 2018-05-10 DIAGNOSIS — B9562 Methicillin resistant Staphylococcus aureus infection as the cause of diseases classified elsewhere: Secondary | ICD-10-CM | POA: Diagnosis not present

## 2018-05-10 DIAGNOSIS — D631 Anemia in chronic kidney disease: Secondary | ICD-10-CM | POA: Diagnosis not present

## 2018-05-10 DIAGNOSIS — L089 Local infection of the skin and subcutaneous tissue, unspecified: Secondary | ICD-10-CM | POA: Diagnosis present

## 2018-05-10 DIAGNOSIS — A419 Sepsis, unspecified organism: Principal | ICD-10-CM | POA: Diagnosis present

## 2018-05-10 DIAGNOSIS — N2581 Secondary hyperparathyroidism of renal origin: Secondary | ICD-10-CM | POA: Diagnosis present

## 2018-05-10 DIAGNOSIS — I429 Cardiomyopathy, unspecified: Secondary | ICD-10-CM | POA: Diagnosis present

## 2018-05-10 DIAGNOSIS — H409 Unspecified glaucoma: Secondary | ICD-10-CM | POA: Diagnosis present

## 2018-05-10 DIAGNOSIS — E11628 Type 2 diabetes mellitus with other skin complications: Secondary | ICD-10-CM | POA: Diagnosis present

## 2018-05-10 DIAGNOSIS — L97519 Non-pressure chronic ulcer of other part of right foot with unspecified severity: Secondary | ICD-10-CM | POA: Diagnosis not present

## 2018-05-10 DIAGNOSIS — E11610 Type 2 diabetes mellitus with diabetic neuropathic arthropathy: Secondary | ICD-10-CM | POA: Diagnosis present

## 2018-05-10 DIAGNOSIS — I120 Hypertensive chronic kidney disease with stage 5 chronic kidney disease or end stage renal disease: Secondary | ICD-10-CM | POA: Diagnosis present

## 2018-05-10 DIAGNOSIS — Z89421 Acquired absence of other right toe(s): Secondary | ICD-10-CM

## 2018-05-10 DIAGNOSIS — Z87891 Personal history of nicotine dependence: Secondary | ICD-10-CM | POA: Diagnosis not present

## 2018-05-10 DIAGNOSIS — L89610 Pressure ulcer of right heel, unstageable: Secondary | ICD-10-CM | POA: Diagnosis not present

## 2018-05-10 DIAGNOSIS — M869 Osteomyelitis, unspecified: Secondary | ICD-10-CM | POA: Diagnosis not present

## 2018-05-10 DIAGNOSIS — L02612 Cutaneous abscess of left foot: Secondary | ICD-10-CM | POA: Diagnosis not present

## 2018-05-10 DIAGNOSIS — I959 Hypotension, unspecified: Secondary | ICD-10-CM | POA: Diagnosis present

## 2018-05-10 DIAGNOSIS — L97529 Non-pressure chronic ulcer of other part of left foot with unspecified severity: Secondary | ICD-10-CM | POA: Diagnosis not present

## 2018-05-10 DIAGNOSIS — I10 Essential (primary) hypertension: Secondary | ICD-10-CM

## 2018-05-10 DIAGNOSIS — E1122 Type 2 diabetes mellitus with diabetic chronic kidney disease: Secondary | ICD-10-CM | POA: Diagnosis present

## 2018-05-10 DIAGNOSIS — N186 End stage renal disease: Secondary | ICD-10-CM | POA: Diagnosis not present

## 2018-05-10 DIAGNOSIS — Z794 Long term (current) use of insulin: Secondary | ICD-10-CM | POA: Diagnosis not present

## 2018-05-10 DIAGNOSIS — R509 Fever, unspecified: Secondary | ICD-10-CM | POA: Diagnosis not present

## 2018-05-10 DIAGNOSIS — E785 Hyperlipidemia, unspecified: Secondary | ICD-10-CM | POA: Diagnosis present

## 2018-05-10 DIAGNOSIS — E1129 Type 2 diabetes mellitus with other diabetic kidney complication: Secondary | ICD-10-CM | POA: Diagnosis present

## 2018-05-10 DIAGNOSIS — E1169 Type 2 diabetes mellitus with other specified complication: Secondary | ICD-10-CM | POA: Diagnosis not present

## 2018-05-10 DIAGNOSIS — Z89422 Acquired absence of other left toe(s): Secondary | ICD-10-CM

## 2018-05-10 DIAGNOSIS — M86172 Other acute osteomyelitis, left ankle and foot: Secondary | ICD-10-CM | POA: Diagnosis not present

## 2018-05-10 DIAGNOSIS — E1151 Type 2 diabetes mellitus with diabetic peripheral angiopathy without gangrene: Secondary | ICD-10-CM | POA: Diagnosis not present

## 2018-05-10 DIAGNOSIS — E1142 Type 2 diabetes mellitus with diabetic polyneuropathy: Secondary | ICD-10-CM | POA: Diagnosis present

## 2018-05-10 DIAGNOSIS — E11621 Type 2 diabetes mellitus with foot ulcer: Secondary | ICD-10-CM | POA: Diagnosis not present

## 2018-05-10 DIAGNOSIS — R000 Tachycardia, unspecified: Secondary | ICD-10-CM | POA: Diagnosis not present

## 2018-05-10 DIAGNOSIS — Z992 Dependence on renal dialysis: Secondary | ICD-10-CM

## 2018-05-10 DIAGNOSIS — E1121 Type 2 diabetes mellitus with diabetic nephropathy: Secondary | ICD-10-CM | POA: Diagnosis not present

## 2018-05-10 DIAGNOSIS — D509 Iron deficiency anemia, unspecified: Secondary | ICD-10-CM | POA: Diagnosis not present

## 2018-05-10 LAB — COMPREHENSIVE METABOLIC PANEL
ALT: 9 U/L — ABNORMAL LOW (ref 17–63)
ANION GAP: 14 (ref 5–15)
AST: 23 U/L (ref 15–41)
Albumin: 2.7 g/dL — ABNORMAL LOW (ref 3.5–5.0)
Alkaline Phosphatase: 70 U/L (ref 38–126)
BUN: 14 mg/dL (ref 6–20)
CHLORIDE: 101 mmol/L (ref 101–111)
CO2: 25 mmol/L (ref 22–32)
CREATININE: 5.03 mg/dL — AB (ref 0.61–1.24)
Calcium: 7.3 mg/dL — ABNORMAL LOW (ref 8.9–10.3)
GFR, EST AFRICAN AMERICAN: 13 mL/min — AB (ref >60)
GFR, EST NON AFRICAN AMERICAN: 11 mL/min — AB (ref >60)
Glucose, Bld: 189 mg/dL — ABNORMAL HIGH (ref 65–99)
Potassium: 4.2 mmol/L (ref 3.5–5.1)
SODIUM: 140 mmol/L (ref 135–145)
Total Bilirubin: 1.2 mg/dL (ref 0.3–1.2)
Total Protein: 6.6 g/dL (ref 6.5–8.1)

## 2018-05-10 LAB — CBC WITH DIFFERENTIAL/PLATELET
ABS IMMATURE GRANULOCYTES: 0 10*3/uL (ref 0.0–0.1)
Basophils Absolute: 0 10*3/uL (ref 0.0–0.1)
Basophils Relative: 0 %
EOS ABS: 0.5 10*3/uL (ref 0.0–0.7)
Eosinophils Relative: 6 %
HEMATOCRIT: 37.5 % — AB (ref 39.0–52.0)
Hemoglobin: 11.9 g/dL — ABNORMAL LOW (ref 13.0–17.0)
IMMATURE GRANULOCYTES: 0 %
LYMPHS ABS: 1.2 10*3/uL (ref 0.7–4.0)
Lymphocytes Relative: 17 %
MCH: 30.4 pg (ref 26.0–34.0)
MCHC: 31.7 g/dL (ref 30.0–36.0)
MCV: 95.7 fL (ref 78.0–100.0)
Monocytes Absolute: 1 10*3/uL (ref 0.1–1.0)
Monocytes Relative: 14 %
NEUTROS ABS: 4.6 10*3/uL (ref 1.7–7.7)
NEUTROS PCT: 63 %
PLATELETS: 102 10*3/uL — AB (ref 150–400)
RBC: 3.92 MIL/uL — ABNORMAL LOW (ref 4.22–5.81)
RDW: 14.8 % (ref 11.5–15.5)
WBC: 7.3 10*3/uL (ref 4.0–10.5)

## 2018-05-10 LAB — C-REACTIVE PROTEIN: CRP: 13.8 mg/dL — ABNORMAL HIGH (ref <1.0)

## 2018-05-10 LAB — I-STAT CG4 LACTIC ACID, ED
LACTIC ACID, VENOUS: 1.2 mmol/L (ref 0.5–1.9)
LACTIC ACID, VENOUS: 1.59 mmol/L (ref 0.5–1.9)

## 2018-05-10 MED ORDER — VANCOMYCIN HCL IN DEXTROSE 1-5 GM/200ML-% IV SOLN
1000.0000 mg | Freq: Once | INTRAVENOUS | Status: DC
  Filled 2018-05-10: qty 200

## 2018-05-10 MED ORDER — HYDROXYZINE HCL 10 MG PO TABS
10.0000 mg | ORAL_TABLET | Freq: Three times a day (TID) | ORAL | Status: DC | PRN
  Administered 2018-05-13: 10 mg via ORAL
  Filled 2018-05-10 (×2): qty 1

## 2018-05-10 MED ORDER — SODIUM CHLORIDE 0.9 % IV SOLN: 2.0000 g | Freq: Once | INTRAVENOUS | Status: DC

## 2018-05-10 MED ORDER — INSULIN ASPART 100 UNIT/ML ~~LOC~~ SOLN
0.0000 [IU] | Freq: Three times a day (TID) | SUBCUTANEOUS | Status: DC
  Administered 2018-05-11: 2 [IU] via SUBCUTANEOUS
  Administered 2018-05-11: 3 [IU] via SUBCUTANEOUS
  Administered 2018-05-12 – 2018-05-13 (×3): 2 [IU] via SUBCUTANEOUS
  Administered 2018-05-13: 1 [IU] via SUBCUTANEOUS
  Administered 2018-05-13 – 2018-05-14 (×3): 2 [IU] via SUBCUTANEOUS
  Filled 2018-05-10: qty 1

## 2018-05-10 MED ORDER — HYDROCODONE-ACETAMINOPHEN 5-325 MG PO TABS: 1.0000 | ORAL_TABLET | Freq: Four times a day (QID) | ORAL | Status: DC | PRN

## 2018-05-10 MED ORDER — ZOLPIDEM TARTRATE 5 MG PO TABS
5.0000 mg | ORAL_TABLET | Freq: Every evening | ORAL | Status: DC | PRN
  Filled 2018-05-10: qty 1

## 2018-05-10 MED ORDER — ACETAMINOPHEN 325 MG PO TABS
650.0000 mg | ORAL_TABLET | Freq: Four times a day (QID) | ORAL | Status: DC | PRN
  Administered 2018-05-11 (×2): 650 mg via ORAL
  Filled 2018-05-10 (×2): qty 2

## 2018-05-10 MED ORDER — INSULIN ASPART PROT & ASPART (70-30 MIX) 100 UNIT/ML ~~LOC~~ SUSP: 1.0000 [IU] | Freq: Two times a day (BID) | SUBCUTANEOUS | Status: DC

## 2018-05-10 MED ORDER — SODIUM CHLORIDE 0.9 % IV BOLUS
250.0000 mL | Freq: Once | INTRAVENOUS | Status: AC
  Administered 2018-05-10: 250 mL via INTRAVENOUS

## 2018-05-10 MED ORDER — VANCOMYCIN HCL 10 G IV SOLR
2000.0000 mg | Freq: Once | INTRAVENOUS | Status: AC
  Administered 2018-05-10: 2000 mg via INTRAVENOUS
  Filled 2018-05-10: qty 2000

## 2018-05-10 MED ORDER — FERRIC CITRATE 1 GM 210 MG(FE) PO TABS
420.0000 mg | ORAL_TABLET | Freq: Three times a day (TID) | ORAL | Status: DC
  Administered 2018-05-11 – 2018-05-14 (×8): 420 mg via ORAL
  Filled 2018-05-10 (×12): qty 2

## 2018-05-10 MED ORDER — VITAMIN C 500 MG PO TABS: 500.0000 mg | ORAL_TABLET | ORAL | Status: DC

## 2018-05-10 MED ORDER — METRONIDAZOLE 500 MG PO TABS
500.0000 mg | ORAL_TABLET | Freq: Three times a day (TID) | ORAL | Status: DC
  Administered 2018-05-11 – 2018-05-12 (×4): 500 mg via ORAL
  Filled 2018-05-10 (×4): qty 1

## 2018-05-10 MED ORDER — HYDRALAZINE HCL 20 MG/ML IJ SOLN: 5.0000 mg | INTRAMUSCULAR | Status: DC | PRN

## 2018-05-10 MED ORDER — INSULIN ASPART 100 UNIT/ML ~~LOC~~ SOLN
0.0000 [IU] | Freq: Every day | SUBCUTANEOUS | Status: DC
  Administered 2018-05-11: 2 [IU] via SUBCUTANEOUS
  Filled 2018-05-10: qty 1

## 2018-05-10 MED ORDER — METRONIDAZOLE IN NACL 5-0.79 MG/ML-% IV SOLN: 500.0000 mg | Freq: Once | INTRAVENOUS | Status: DC

## 2018-05-10 MED ORDER — INSULIN ASPART 100 UNIT/ML ~~LOC~~ SOLN: 0.0000 [IU] | Freq: Three times a day (TID) | SUBCUTANEOUS | Status: DC

## 2018-05-10 MED ORDER — PRIMIDONE 50 MG PO TABS
50.0000 mg | ORAL_TABLET | Freq: Every day | ORAL | Status: DC
  Administered 2018-05-11 – 2018-05-13 (×4): 50 mg via ORAL
  Filled 2018-05-10 (×5): qty 1

## 2018-05-10 MED ORDER — CEFAZOLIN SODIUM-DEXTROSE 1-4 GM/50ML-% IV SOLN
1.0000 g | INTRAVENOUS | Status: DC
  Administered 2018-05-10 – 2018-05-11 (×2): 1 g via INTRAVENOUS
  Filled 2018-05-10 (×3): qty 50

## 2018-05-10 MED ORDER — MUPIROCIN 2 % EX OINT: 1.0000 "application " | TOPICAL_OINTMENT | Freq: Every day | CUTANEOUS | Status: DC | PRN

## 2018-05-10 MED ORDER — ATORVASTATIN CALCIUM 20 MG PO TABS
20.0000 mg | ORAL_TABLET | Freq: Every day | ORAL | Status: DC
  Administered 2018-05-11 – 2018-05-13 (×4): 20 mg via ORAL
  Filled 2018-05-10 (×4): qty 1

## 2018-05-10 MED ORDER — ADULT MULTIVITAMIN W/MINERALS CH
1.0000 | ORAL_TABLET | Freq: Every day | ORAL | Status: DC
  Administered 2018-05-11 – 2018-05-14 (×4): 1 via ORAL
  Filled 2018-05-10 (×4): qty 1

## 2018-05-10 MED ORDER — TIMOLOL MALEATE 0.5 % OP SOLN
1.0000 [drp] | Freq: Two times a day (BID) | OPHTHALMIC | Status: DC
  Administered 2018-05-11 – 2018-05-14 (×6): 1 [drp] via OPHTHALMIC
  Filled 2018-05-10: qty 5

## 2018-05-10 NOTE — ED Notes (Signed)
Ordered renal tray

## 2018-05-10 NOTE — ED Notes (Signed)
Pt ambulated to bathroom x1 assist.  

## 2018-05-10 NOTE — ED Triage Notes (Addendum)
Pt arrived via [facility] from dialysis center after finishing full treatment.  Pt recently seen by PCP for left foot wound and was prescribed antibiotics which the patient hasn't started.  States pus was cultured and hasn't received results from that.  [facility] recorded his fever at 101.0 today.  EMS gave 1000mg  tylenol in route.  Pt has tremors at baseline.  EMS gave 108ml NS

## 2018-05-10 NOTE — Progress Notes (Signed)
Pharmacy Antibiotic Note  [name] Rog. is a 67 y.o. male presenting with a diabetic foot infection. He was seen outpatient by his PCP yesterday and prescribed antibiotics, but never started them. A culture from this visit is now growing S. aureus (susceptibilities pending). Pharmacy has been consulted for vancomycin and cefazolin dosing. Noted PCN allergy, but patient has tolerated cefepime and ceftazidime in the past. Patient has ESRD on HD MWF.  Plan: Vancomycin 2g IV x1, followed by vancomycin 1000mg  IV qHD - f/u HD schedule to order maintenance doses Cefazolin 1g IV q24h F/u C&S, clinical status, HD schedule, de-escalation, LOT, vancomycin levels as indicated  Height: 6\' 1"  (185.4 cm) Weight: 210 lb (95.3 kg) IBW/kg (Calculated) : 79.9  No data recorded.  Recent Labs  Lab 05/04/18 1140  CREATININE 8.20*    Estimated Creatinine Clearance: 10 mL/min (A) (by C-G formula based on SCr of 8.2 mg/dL (H)).    Allergies  Allergen Reactions  . Penicillins Hives, Swelling and Other (See Comments)    Ankle swelling  PATIENT HAD A PCN REACTION WITH IMMEDIATE RASH, FACIAL/TONGUE/THROAT SWELLING, SOB, OR LIGHTHEADEDNESS WITH HYPOTENSION:  #  #  #  YES  #  #  #   Has patient had a PCN reaction causing severe rash involving mucus membranes or skin necrosis: no Has patient had a PCN reaction that required hospitalization: no Has patient had a PCN reaction occurring within the last 10 years: no If all of the above answers are "NO", then may proceed with Cephalosporin use.   Antimicrobials this admission: Vanc 5/15 >> Cefazolin 5/15 >>  Dose adjustments this admission: None  Microbiology results: 5/14 wound cx: S. Aureus 5/15 BCx: pending  Thank you for allowing pharmacy to be a part of this patient's care.  Mila Merry Gerarda Fraction, PharmD PGY1 Pharmacy Resident Pager: [phone] 05/10/2018 6:21 PM

## 2018-05-10 NOTE — H&P (Addendum)
History and Physical    [name] [name]. HYI:[id number] DOB: Jun 29, 1951 DOA: 05/10/2018  Referring MD/NP/PA:   PCP: [name], MD   Patient coming from:  The patient is coming from home.  At baseline, pt is independent for most of ADL.  Chief Complaint: left foot infection and fever, chills  HPI: [name]. is a 67 y.o. male with medical history significant of hypertension, hyperlipidemia, diabetes mellitus, ESRD-HD (MWF), PVD, s/p of amputation of multiple toes, anemia, who presents with left foot infection and fever and chills.  Pt states that has chronic foot ulcer/wound, currently one ulcer in the lateral side of left foot, 2 small ulcer in the right foot bottom. He has been treated in wound care clinic. He was recently seen at his primary care provider for a left foot wound and prescribed antibiotics, but he has not picked up the antibiotics yet. Today, he went to dialysis and could finished full course of dialysis, but found to have acute chills and fever of 101.  Patient does not have any tenderness in the feet.  He denies chest pain, shortness of breath, cough, nausea, vomiting, diarrhea, abdominal pain, symptoms of UTI. Pt states that Dr. Bridgett Larsson of [facility] plans to do angiogram for his legs due to PAD tomorrow. Patient had hypotension with blood pressure 81/54. He was given 500 cc of NS in dialysis center and 250 cc normal saline in ED, his blood pressure improved to 162/83.   ED Course: pt was found to have WBC 7.3, lactic acid 1.20, 1.59, potassium 4.2, bicarbonate 25, creatinine 5.03, BUN 14, temperature 101.3, heart rate in 90s, tachypnea, oxygen satting 90 to 97% on room air, negative chest x-ray.  X-ray of left foot showed no osteomyelitis.  Patient is admitted to telemetry bed to as inpt.   Review of Systems:   General: has fevers, chills, no body weight gain, has fatigue HEENT: no blurry vision, hearing changes or sore throat Respiratory: no dyspnea, coughing,  wheezing CV: no chest pain, no palpitations GI: no nausea, vomiting, abdominal pain, diarrhea, constipation GU: no dysuria, burning on urination, increased urinary frequency, hematuria  Ext: no leg edema Neuro: no unilateral weakness, numbness, or tingling, no vision change or hearing loss Skin: has ulcers in both feet. MSK: No muscle spasm, no deformity, no limitation of range of movement in spin Heme: No easy bruising.  Travel history: No recent long distant travel.  Allergy:  Allergies  Allergen Reactions  . Penicillins Hives, Swelling and Other (See Comments)    Ankle swelling  PATIENT HAD A PCN REACTION WITH IMMEDIATE RASH, FACIAL/TONGUE/THROAT SWELLING, SOB, OR LIGHTHEADEDNESS WITH HYPOTENSION:  #  #  #  YES  #  #  #   Has patient had a PCN reaction causing severe rash involving mucus membranes or skin necrosis: no Has patient had a PCN reaction that required hospitalization: no Has patient had a PCN reaction occurring within the last 10 years: no If all of the above answers are "NO", then may proceed with Cephalosporin use.    Past Medical History:  Diagnosis Date  . Cardiomyopathy ([place])   . Cataract    right eye  . Diabetes mellitus    type 2  . Diabetic foot ulcer ([place])   . Diabetic infection of right foot ([place])   . Diabetic peripheral neuropathy ([place])   . ESRD (end stage renal disease) ([place])    m-w-f [facility] [address]  . Glaucoma    left  eye  . Headache   . HTN (hypertension)   . Osteomyelitis ([place])   . Peripheral vascular disease ([place])    critical limb ischemia    Past Surgical History:  Procedure Laterality Date  . ABDOMINAL AORTOGRAM N/A 05/04/2018   Procedure: ABDOMINAL AORTOGRAM;  Surgeon: Conrad [place], MD;  Location: [facility];  Service: Cardiovascular;  Laterality: N/A;  . AMPUTATION TOE Right 01/21/2017   Procedure: AMPUTATION 2ND TOE RIGHT FOOT ;  Surgeon: Dorna Leitz, MD;  Location: [facility];  Service: Orthopedics;   Laterality: Right;  . AMPUTATION TOE Left 05/18/2017   Procedure: 5TH RAY AMPUTATION  and EXCISION CUBOID;  Surgeon: Dorna Leitz, MD;  Location: [facility];  Service: Orthopedics;  Laterality: Left;  . AMPUTATION TOE Bilateral 01/27/2018   Procedure: RIGHT FOOT 3RD TOE AMPUTATION WITH INCISIONAL WOUND DEBRIDEMENT. LEFT FOOT 2ND TOE AMPUTATION AND INCISIONAL WOUND DEBRIDEMENT.;  Surgeon: Dorna Leitz, MD;  Location: [facility];  Service: Orthopedics;  Laterality: Bilateral;  . AV FISTULA PLACEMENT Left 11/27/2014   Procedure: ARTERIOVENOUS (AV) FISTULA CREATION;  Surgeon: Conrad Alpha, MD;  Location: [facility];  Service: Vascular;  Laterality: Left;  . CHOLECYSTECTOMY    . COLONOSCOPY N/A 11/28/2014   Procedure: COLONOSCOPY;  Surgeon: Ladene Artist, MD;  Location: [facility] ENDOSCOPY;  Service: Endoscopy;  Laterality: N/A;  . ESOPHAGOGASTRODUODENOSCOPY N/A 11/28/2014   Procedure: ESOPHAGOGASTRODUODENOSCOPY (EGD);  Surgeon: Ladene Artist, MD;  Location: [facility] ENDOSCOPY;  Service: Endoscopy;  Laterality: N/A;  . EYE SURGERY     retina and cataract left eye surgery  . I&D EXTREMITY  01/28/2012   Procedure: IRRIGATION AND DEBRIDEMENT EXTREMITY;  Surgeon: Paulene Floor, MD;  Location: [facility];  Service: Orthopedics;  Laterality: Left;  irrigation and drainage left hand  . I&D EXTREMITY  01/30/2012   Procedure: IRRIGATION AND DEBRIDEMENT EXTREMITY;  Surgeon: Paulene Floor, MD;  Location: [facility];  Service: Orthopedics;  Laterality: Left;  flexor teno synovectomy and extensor tenosynovectomy arthrosynovectomy wristjoint  . I&D EXTREMITY  02/01/2012   Procedure: IRRIGATION AND DEBRIDEMENT EXTREMITY;  Surgeon: Paulene Floor, MD;  Location: [facility];  Service: Orthopedics;  Laterality: Left;  Left Wrist, Hand and Forearm  . IRRIGATION AND DEBRIDEMENT FOOT Left 01/21/2017   Procedure: DEBRIDEMENT LEFT PLANTAR WOUND;  Surgeon: Dorna Leitz, MD;  Location: [place];  Service: Orthopedics;  Laterality: Left;  . Left 4th toe  amputation    . LEFT AND RIGHT HEART CATHETERIZATION WITH CORONARY ANGIOGRAM N/A 11/26/2014   Procedure: LEFT AND RIGHT HEART CATHETERIZATION WITH CORONARY ANGIOGRAM;  Surgeon: Leonie Man, MD;  Location: [facility] CATH LAB;  Service: Cardiovascular;  Laterality: N/A;  . LOWER EXTREMITY ANGIOGRAPHY Bilateral 05/04/2018   Procedure: Lower Extremity Angiography;  Surgeon: Conrad , MD;  Location: [facility];  Service: Cardiovascular;  Laterality: Bilateral;  . TONSILLECTOMY      Social History:  reports that he has quit smoking. His smoking use included cigarettes. He has a 2.00 pack-year smoking history. He has never used smokeless tobacco. He reports that he does not drink alcohol or use drugs.  Family History:  Family History  Problem Relation Age of Onset  . Heart disease Mother   . Diabetes Father   . Diabetes Brother   . Diabetes Sister   . Diabetes Brother   . Diabetes Brother      Prior to Admission medications   Medication Sig Start Date End Date Taking? Authorizing Provider  acetaminophen (TYLENOL) 325 MG tablet Take 650 mg by mouth every 6 (six) hours as needed (pain).    Yes [provider]  atorvastatin (LIPITOR) 20 MG tablet Take 20 mg by mouth at bedtime.    Yes [provider]  ferric citrate (AURYXIA) 1 GM 210 MG(Fe) tablet Take 420 mg by mouth 3 (three) times daily with meals.    Yes [provider]  ibuprofen (ADVIL,MOTRIN) 200 MG tablet Take 400 mg by mouth as needed for moderate pain.   Yes [provider]  insulin NPH-regular Human (NOVOLIN 70/30) (70-30) 100 UNIT/ML injection Inject 1-5 Units into the skin 2 (two) times daily with a meal. Sliding Scale   Yes [provider]  Multiple Vitamins-Minerals (MULTIVITAMIN WITH MINERALS) tablet Take 1 tablet by mouth daily.   Yes [provider]  mupirocin ointment (BACTROBAN) 2 % Apply 1 application topically daily as needed (rash on stomach).  08/05/16  Yes  [provider]  primidone (MYSOLINE) 50 MG tablet Take 0.5 tablets (25 mg total) by mouth at bedtime. Patient taking differently: Take 50 mg by mouth at bedtime.  05/25/17  Yes Patrecia Pour, MD  timolol (TIMOPTIC) 0.5 % ophthalmic solution Place 1 drop into the left eye 2 (two) times daily.   Yes [provider]  vitamin C (ASCORBIC ACID) 500 MG tablet Take 500 mg by mouth 2 (two) times a week.   Yes [provider]  glucose monitoring kit (FREESTYLE) monitoring kit 1 each by Does not apply route 4 (four) times daily - after meals and at bedtime. 1 month Diabetic Testing Supplies for QAC-QHS accuchecks.Any brand OK Patient not taking: Reported on 05/01/2018 11/29/14   Thurnell Lose, MD  HYDROcodone-acetaminophen (NORCO/VICODIN) 5-325 MG tablet Take 1 tablet by mouth every 6 (six) hours as needed for moderate pain. Patient not taking: Reported on 05/10/2018 01/27/18   Gary Fleet, PA-C  Insulin Syringe-Needle U-100 25G X 1" 1 ML MISC Any brand, for 4 times a day insulin SQ, 1 month supply. Patient not taking: Reported on 05/01/2018 11/29/14   Thurnell Lose, MD    Physical Exam: Vitals:   05/10/18 2247 05/10/18 2248 05/10/18 2303 05/10/18 2330  BP: (!) 162/83   (!) 161/84  Pulse:  (!) 107  (!) 115  Resp:  14  17  Temp:   (!) 100.4 F (38 C)   TempSrc:   Oral   SpO2:  97%  100%  Weight:      Height:       General: Not in acute distress HEENT:       Eyes: PERRL, EOMI, no scleral icterus.       ENT: No discharge from the ears and nose, no pharynx injection, no tonsillar enlargement.        Neck: No JVD, no bruit, no mass felt. Heme: No neck lymph node enlargement. Cardiac: S1/S2, RRR, No murmurs, No gallops or rubs. Respiratory: No rales, wheezing, rhonchi or rubs. GI: Soft, nondistended, nontender, no rebound pain, no organomegaly, BS present. GU: No hematuria Ext: No pitting leg edema bilaterally. Per EDP, DP/PT pulse are detectable by doppler  bilaterally. 4th and 5th toe are missing in left foot; 2nd toe half missing; 3rd, 4th and 5th toes in right foot missing.  Musculoskeletal: No joint deformities, No joint redness or warmth, no limitation of ROM in spin. Skin: has a chronic ulcer in the lateral side of left foot, has a new area with swelling and fluctuance  on the lateral side of left foot. There are two small ulcers in the bottom of right foot which is dry.  Neuro: Alert, oriented X3, cranial nerves II-XII grossly intact, moves all extremities normally. Psych: Patient is not psychotic, no suicidal or hemocidal ideation.  Labs on Admission: I have personally reviewed following labs and imaging studies  CBC: Recent Labs  Lab 05/04/18 1140 05/10/18 1834  WBC  --  7.3  NEUTROABS  --  4.6  HGB 13.9 11.9*  HCT 41.0 37.5*  MCV  --  95.7  PLT  --  940*   Basic Metabolic Panel: Recent Labs  Lab 05/04/18 1140 05/10/18 1834  NA 137 140  K 4.5 4.2  CL 95* 101  CO2  --  25  GLUCOSE 135* 189*  BUN 31* 14  CREATININE 8.20* 5.03*  CALCIUM  --  7.3*   GFR: Estimated Creatinine Clearance: 16.3 mL/min (A) (by C-G formula based on SCr of 5.03 mg/dL (H)). Liver Function Tests: Recent Labs  Lab 05/10/18 1834  AST 23  ALT 9*  ALKPHOS 70  BILITOT 1.2  PROT 6.6  ALBUMIN 2.7*   No results for input(s): LIPASE, AMYLASE in the last 168 hours. No results for input(s): AMMONIA in the last 168 hours. Coagulation Profile: No results for input(s): INR, PROTIME in the last 168 hours. Cardiac Enzymes: No results for input(s): CKTOTAL, CKMB, CKMBINDEX, TROPONINI in the last 168 hours. BNP (last 3 results) No results for input(s): PROBNP in the last 8760 hours. HbA1C: No results for input(s): HGBA1C in the last 72 hours. CBG: Recent Labs  Lab 05/04/18 1326  GLUCAP 108*   Lipid Profile: No results for input(s): CHOL, HDL, LDLCALC, TRIG, CHOLHDL, LDLDIRECT in the last 72 hours. Thyroid Function Tests: No results for  input(s): TSH, T4TOTAL, FREET4, T3FREE, THYROIDAB in the last 72 hours. Anemia Panel: No results for input(s): VITAMINB12, FOLATE, FERRITIN, TIBC, IRON, RETICCTPCT in the last 72 hours. Urine analysis:    Component Value Date/Time   COLORURINE YELLOW 09/11/2016 1933   APPEARANCEUR CLEAR 09/11/2016 1933   LABSPEC 1.015 09/11/2016 1933   PHURINE 8.5 (H) 09/11/2016 1933   GLUCOSEU 100 (A) 09/11/2016 [place] NEGATIVE 09/11/2016 1933   BILIRUBINUR SMALL (A) 09/11/2016 1933   KETONESUR NEGATIVE 09/11/2016 1933   PROTEINUR 100 (A) 09/11/2016 1933   UROBILINOGEN 0.2 11/22/2014 1740   NITRITE NEGATIVE 09/11/2016 1933   LEUKOCYTESUR NEGATIVE 09/11/2016 1933   Sepsis Labs: _0 (procalcitonin:4,lacticidven:4) ) Recent Results (from the past 240 hour(s))  Aerobic Culture (superficial specimen)     Status: None (Preliminary result)   Collection Time: 05/09/18  1:05 PM  Result Value Ref Range Status   Specimen Description   Final    FOOT LEFT Performed at [facility], [place] [address] [place], [place] [address]    Special Requests   Final    NONE Performed at [facility], [place] [address], [place], [place] [address]    Gram Stain   Final    RARE WBC PRESENT, PREDOMINANTLY PMN FEW GRAM POSITIVE COCCI    Culture   Final    FEW STAPHYLOCOCCUS AUREUS SUSCEPTIBILITIES TO FOLLOW Performed at [facility], [place] [address], [place], [place] [address]    Report Status PENDING  Incomplete  Blood Culture (routine x 2)     Status: None (Preliminary result)   Collection Time: 05/10/18  6:36 PM  Result Value Ref Range Status   Specimen Description BLOOD SITE  NOT SPECIFIED  Final   Special Requests   Final    BOTTLES DRAWN AEROBIC ONLY Blood Culture adequate volume   Culture PENDING  Incomplete   Report Status PENDING  Incomplete     Radiological Exams on Admission: Dg Chest Port 1 View  Result Date: 05/10/2018 CLINICAL DATA:  Sepsis  EXAM: PORTABLE CHEST 1 VIEW COMPARISON:  01/27/2018 FINDINGS: The heart size and mediastinal contours are within normal limits. Both lungs are clear. Mild aortic atherosclerosis. The visualized skeletal structures are unremarkable. IMPRESSION: No active disease. Electronically Signed   By: Donavan Foil M.D.   On: 05/10/2018 19:20   Dg Foot Complete Left  Result Date: 05/10/2018 CLINICAL DATA:  Foot wounds.  Evaluate for osteomyelitis. EXAM: LEFT FOOT - COMPLETE 3+ VIEW COMPARISON:  Foot MRI 01/04/2018 FINDINGS: Status post fifth ray resection. Prior fourth digit amputation. Partial amputations of the second and third digits. There is advanced erosive changes at the base of the fourth metatarsal. Irregularity is seen along the more medial Lisfranc joint with cystic type space at the base of the second metatarsal. Prominent erosion of the cuboid on the oblique view. Lateral midfoot ulcers. No tracking soft tissue gas. Osteopenia. IMPRESSION: Known midfoot osteomyelitis, reference foot MRI 01/04/2018. There is associated prominent erosion of the fourth metatarsal base and cuboid. Electronically Signed   By: Monte Fantasia M.D.   On: 05/10/2018 21:48     EKG: Independently reviewed.  Not done in ED, will get one.   Assessment/Plan Principal Problem:   Diabetic foot infection ([place]) Active Problems:   Anemia due to end stage renal disease (HCC)   Benign essential HTN   Peripheral arterial disease (HCC)   Osteomyelitis of left foot (HCC)   ESRD on hemodialysis (HCC)   Type II diabetes mellitus with renal manifestations (HCC)   Sepsis ([place])   HLD (hyperlipidemia)   Sepsis due to diabetic foot infection, osteomyelitis of left foot: Patient meets criteria for sepsis with fever, tachycardia, tachypnea.  Patient also had hypotension, which is likley partially due to fluid removal from the dialysis.  Blood pressure responded to IV fluid, becomes hypertensive after giving total of 750 cc normal saline.   Currently hemodynamically stable.  - will admit to tele bed as inpt - Empiric antimicrobial treatment with vancomycin, ancef and flagyl - PRN hydroxyzine for nausea, prn Percocet for pain - Blood cultures x 2  - ESR and CRP - wound care consult - will get Procalcitonin and trend lactic acid levels per sepsis protocol. - IVF: 750 cc L of NS bolus-->prn bolus  - NS 50 cc/h - Please consult ortho in AM  Anemia due to end stage renal disease ([place]): Hemoglobin stable, 11.9 on admission -F/u by CBC  Benign essential HTN: Patient is not taking medications at home.  Initially hypotensive, currently blood pressure 162/83.  -IV hydralazine as needed  Peripheral arterial disease ([place]):  -[facility], Dr. Bridgett Larsson is planing to do angiogram tomorrow  ESRD on hemodialysis (MWF): potassium 4.2, bicarbonate 25, creatinine 5.03, BUN 14 -please call renal in AM  Type II diabetes mellitus with renal manifestations and PVD: Last A1c 6.7 on 5/21, well controled. Patient is taking 70/30 insulin and SSI at home -SSI  HLD (hyperlipidemia); -lipitor  DVT ppx: SCD Code Status: Full code Family Communication:    Yes, patient's wife at bed side Disposition Plan:  Anticipate discharge back to previous home environment Consults called:  none Admission status: Inpatient/tele     Date of Service 05/10/2018  Ivor Costa [facility] Pager [phone]  If 7PM-7AM, please contact night-coverage [email or web address] Password [email or web address] 05/10/2018, 11:59 PM

## 2018-05-10 NOTE — ED Provider Notes (Signed)
[facility] EMERGENCY DEPARTMENT Provider Note   CSN: [id number] Arrival date & time: 05/10/18  1726     History   Chief Complaint Chief Complaint  Patient presents with  . Wound Infection  . Code Sepsis    HPI [name]. is a 67 y.o. male patient presents today from dialysis for evaluation of fever.  He finished his full treatment.  He was recently seen at his primary care provider for a left foot wound and prescribed antibiotics, patient has not picked up the antibiotics are seen it.  Dialysis recorded his temperature at 100.1 today.  EMS gave him 100 mg of Tylenol in route and 500 cc of fluid as his pressures were soft.  HPI  Past Medical History:  Diagnosis Date  . Cardiomyopathy ([place])   . Cataract    right eye  . Diabetes mellitus    type 2  . Diabetic foot ulcer ([place])   . Diabetic infection of right foot ([place])   . Diabetic peripheral neuropathy ([place])   . ESRD (end stage renal disease) ([place])    m-w-f [facility] [address]  . Glaucoma    left eye  . Headache   . HTN (hypertension)   . Osteomyelitis ([place])   . Peripheral vascular disease ([place])    critical limb ischemia    Patient Active Problem List   Diagnosis Date Noted  . Diabetic foot infection ([place]) 05/10/2018  . Type II diabetes mellitus with renal manifestations ([place]) 05/10/2018  . Sepsis ([place]) 05/10/2018  . HLD (hyperlipidemia) 05/10/2018  . Diabetic ulcer of foot associated with diabetes mellitus due to underlying condition, with necrosis of muscle ([place]) 01/27/2018  . Onychomycosis of multiple toenails with type 1 diabetes mellitus ([place]) 01/27/2018  . Onychomycosis of multiple toenails with type 2 diabetes mellitus and peripheral angiopathy ([place]) 10/05/2017  . ESRD on hemodialysis ([place])   . Diabetes mellitus with complication ([place])   . Osteomyelitis of right foot ([place]) 02/03/2017  . Type 2 diabetes mellitus with left diabetic foot ulcer ([place]) 01/21/2017  .  Osteomyelitis of left foot ([place]) 09/11/2016  . Peripheral arterial disease ([place]) 08/12/2015  . Paroxysmal supraventricular tachycardia ([place]) 01/30/2015  . Occult blood in stools 11/25/2014  . Acute systolic heart failure ([place]) 11/25/2014  . Congestive dilated cardiomyopathy ([place]) 11/24/2014  . Visual changes 11/23/2014  . Cardiomegaly 11/23/2014  . Benign essential HTN 11/23/2014  . 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 Anemia due to chronic blood loss 11/23/2014  . Anemia due to end stage renal disease ([place]) 11/22/2014  . Symptomatic anemia   . Cellulitis and abscess of hand 01/28/2012  . HTN (hypertension) 01/28/2012  . Hyponatremia 01/28/2012  . Normocytic anemia 01/28/2012  . Sinusitis 01/28/2012    Past Surgical History:  Procedure Laterality Date  . ABDOMINAL AORTOGRAM N/A 05/04/2018   Procedure: ABDOMINAL AORTOGRAM;  Surgeon: Conrad [place], MD;  Location: [facility];  Service: Cardiovascular;  Laterality: N/A;  . AMPUTATION TOE Right 01/21/2017   Procedure: AMPUTATION 2ND TOE RIGHT FOOT ;  Surgeon: Dorna Leitz, MD;  Location: [facility];  Service: Orthopedics;  Laterality: Right;  . AMPUTATION TOE Left 05/18/2017   Procedure: 5TH RAY AMPUTATION  and EXCISION CUBOID;  Surgeon: Dorna Leitz, MD;  Location: [facility];  Service: Orthopedics;  Laterality: Left;  . AMPUTATION TOE Bilateral 01/27/2018   Procedure: RIGHT FOOT 3RD TOE AMPUTATION WITH INCISIONAL WOUND DEBRIDEMENT. LEFT FOOT 2ND TOE  AMPUTATION AND INCISIONAL WOUND DEBRIDEMENT.;  Surgeon: Dorna Leitz, MD;  Location: [facility];  Service: Orthopedics;  Laterality: Bilateral;  . AV FISTULA PLACEMENT Left 11/27/2014   Procedure: ARTERIOVENOUS (AV) FISTULA CREATION;  Surgeon: Conrad [place], MD;  Location: [facility];  Service: Vascular;  Laterality: Left;  . CHOLECYSTECTOMY    . COLONOSCOPY N/A 11/28/2014   Procedure: COLONOSCOPY;  Surgeon: Ladene Artist, MD;  Location: [facility] ENDOSCOPY;  Service:  Endoscopy;  Laterality: N/A;  . ESOPHAGOGASTRODUODENOSCOPY N/A 11/28/2014   Procedure: ESOPHAGOGASTRODUODENOSCOPY (EGD);  Surgeon: Ladene Artist, MD;  Location: [facility] ENDOSCOPY;  Service: Endoscopy;  Laterality: N/A;  . EYE SURGERY     retina and cataract left eye surgery  . I&D EXTREMITY  01/28/2012   Procedure: IRRIGATION AND DEBRIDEMENT EXTREMITY;  Surgeon: Paulene Floor, MD;  Location: [facility];  Service: Orthopedics;  Laterality: Left;  irrigation and drainage left hand  . I&D EXTREMITY  01/30/2012   Procedure: IRRIGATION AND DEBRIDEMENT EXTREMITY;  Surgeon: Paulene Floor, MD;  Location: [facility];  Service: Orthopedics;  Laterality: Left;  flexor teno synovectomy and extensor tenosynovectomy arthrosynovectomy wristjoint  . I&D EXTREMITY  02/01/2012   Procedure: IRRIGATION AND DEBRIDEMENT EXTREMITY;  Surgeon: Paulene Floor, MD;  Location: [facility];  Service: Orthopedics;  Laterality: Left;  Left Wrist, Hand and Forearm  . IRRIGATION AND DEBRIDEMENT FOOT Left 01/21/2017   Procedure: DEBRIDEMENT LEFT PLANTAR WOUND;  Surgeon: Dorna Leitz, MD;  Location: [facility];  Service: Orthopedics;  Laterality: Left;  . Left 4th toe amputation    . LEFT AND RIGHT HEART CATHETERIZATION WITH CORONARY ANGIOGRAM N/A 11/26/2014   Procedure: LEFT AND RIGHT HEART CATHETERIZATION WITH CORONARY ANGIOGRAM;  Surgeon: Leonie Man, MD;  Location: [facility] CATH LAB;  Service: Cardiovascular;  Laterality: N/A;  . LOWER EXTREMITY ANGIOGRAPHY Bilateral 05/04/2018   Procedure: Lower Extremity Angiography;  Surgeon: Conrad [place], MD;  Location: [facility] CV LAB;  Service: Cardiovascular;  Laterality: Bilateral;  . TONSILLECTOMY          Home Medications    Prior to Admission medications   Medication Sig Start Date End Date Taking? Authorizing Provider  acetaminophen (TYLENOL) 325 MG tablet Take 650 mg by mouth every 6 (six) hours as needed (pain).    Yes [provider]  atorvastatin (LIPITOR) 20 MG tablet  Take 20 mg by mouth at bedtime.    Yes [provider]  ferric citrate (AURYXIA) 1 GM 210 MG(Fe) tablet Take 420 mg by mouth 3 (three) times daily with meals.    Yes [provider]  ibuprofen (ADVIL,MOTRIN) 200 MG tablet Take 400 mg by mouth as needed for moderate pain.   Yes [provider]  insulin NPH-regular Human (NOVOLIN 70/30) (70-30) 100 UNIT/ML injection Inject 1-5 Units into the skin 2 (two) times daily with a meal. Sliding Scale   Yes [provider]  Multiple Vitamins-Minerals (MULTIVITAMIN WITH MINERALS) tablet Take 1 tablet by mouth daily.   Yes [provider]  mupirocin ointment (BACTROBAN) 2 % Apply 1 application topically daily as needed (rash on stomach).  08/05/16  Yes [provider]  primidone (MYSOLINE) 50 MG tablet Take 0.5 tablets (25 mg total) by mouth at bedtime. Patient taking differently: Take 50 mg by mouth at bedtime.  05/25/17  Yes Patrecia Pour, MD  timolol (TIMOPTIC) 0.5 % ophthalmic solution Place 1 drop into the left eye 2 (two) times daily.   Yes [provider]  vitamin C (ASCORBIC ACID) 500 MG tablet Take 500 mg by mouth 2 (two) times a week.   Yes [provider]  glucose monitoring kit (FREESTYLE) monitoring kit 1 each by Does not apply route 4 (four) times daily - after meals and at bedtime. 1 month Diabetic Testing Supplies for QAC-QHS accuchecks.Any brand OK Patient not taking: Reported on 05/01/2018 11/29/14   Thurnell Lose, MD  HYDROcodone-acetaminophen (NORCO/VICODIN) 5-325 MG tablet Take 1 tablet by mouth every 6 (six) hours as needed for moderate pain. Patient not taking: Reported on 05/10/2018 01/27/18   Gary Fleet, PA-C  Insulin Syringe-Needle U-100 25G X 1" 1 ML MISC Any brand, for 4 times a day insulin SQ, 1 month supply. Patient not taking: Reported on 05/01/2018 11/29/14   Thurnell Lose, MD    Family History Family History  Problem Relation Age of Onset  . Heart  disease Mother   . Diabetes Father   . Diabetes Brother   . Diabetes Sister   . Diabetes Brother   . Diabetes Brother     Social History Social History   Tobacco Use  . Smoking status: Former Smoker    Packs/day: 0.50    Years: 4.00    Pack years: 2.00    Types: Cigarettes  . Smokeless tobacco: Never Used  . Tobacco comment: about 40 yrs ago  Substance Use Topics  . Alcohol use: No    Alcohol/week: 0.0 oz  . Drug use: No     Allergies   Penicillins   Review of Systems Review of Systems  Constitutional: Positive for fever. Negative for chills.       Generally not feeling well  HENT: Negative for ear pain and sore throat.   Eyes: Negative for pain and visual disturbance.  Respiratory: Negative for cough and shortness of breath.   Cardiovascular: Negative for chest pain and palpitations.  Gastrointestinal: Negative for abdominal pain and vomiting.  Genitourinary: Negative for flank pain.  Musculoskeletal: Negative for arthralgias and back pain.  Skin: Positive for wound. Negative for color change and rash.  Neurological: Negative for seizures and syncope.  All other systems reviewed and are negative.    Physical Exam Updated Vital Signs BP (!) 162/83   Pulse (!) 107   Temp (!) 100.4 F (38 C) (Oral)   Resp 14   Ht _0  (1.854 m)   Wt 95.3 kg (210 lb)   SpO2 97%   BMI 27.71 kg/m   Physical Exam  Constitutional: He is oriented to person, place, and time. He appears well-developed and well-nourished.  HENT:  Head: Normocephalic and atraumatic.  Eyes: Conjunctivae are normal.  Neck: Neck supple.  Cardiovascular: Normal rate and regular rhythm.  No murmur heard. Bilateral DP/PT pulses found with Doppler.  Pulmonary/Chest: Effort normal and breath sounds normal. No respiratory distress.  Abdominal: Soft. There is no tenderness.  Musculoskeletal: He exhibits no edema.  Neurological: He is alert and oriented to person, place, and time. A sensory deficit  (Bilaterally decreased sensation in bilateral lower extremities, not new. ) is present.  Skin: Skin is warm and dry.  There are multiple chronic appearing ulcers on patient's feet.  There is an area of swelling and fluctuance on the lateral left hind foot.  Please see clinical images.   Psychiatric: He has a normal mood and affect.  Nursing note and vitals reviewed.          ED Treatments / Results  Labs (all labs ordered are  listed, but only abnormal results are displayed) Labs Reviewed  COMPREHENSIVE METABOLIC PANEL - Abnormal; Notable for the following components:      Result Value   Glucose, Bld 189 (*)    Creatinine, Ser 5.03 (*)    Calcium 7.3 (*)    Albumin 2.7 (*)    ALT 9 (*)    GFR calc non Af Amer 11 (*)    GFR calc Af Amer 13 (*)    All other components within normal limits  CBC WITH DIFFERENTIAL/PLATELET - Abnormal; Notable for the following components:   RBC 3.92 (*)    Hemoglobin 11.9 (*)    HCT 37.5 (*)    Platelets 102 (*)    All other components within normal limits  C-REACTIVE PROTEIN - Abnormal; Notable for the following components:   CRP 13.8 (*)    All other components within normal limits  CULTURE, BLOOD (ROUTINE X 2)  CULTURE, BLOOD (ROUTINE X 2)  URINALYSIS, ROUTINE W REFLEX MICROSCOPIC  SEDIMENTATION RATE  LACTIC ACID, PLASMA  PROCALCITONIN  PROTIME-INR  APTT  BASIC METABOLIC PANEL  CBC  I-STAT CG4 LACTIC ACID, ED  I-STAT CG4 LACTIC ACID, ED    EKG None  Radiology Dg Chest Port 1 View  Result Date: 05/10/2018 CLINICAL DATA:  Sepsis EXAM: PORTABLE CHEST 1 VIEW COMPARISON:  01/27/2018 FINDINGS: The heart size and mediastinal contours are within normal limits. Both lungs are clear. Mild aortic atherosclerosis. The visualized skeletal structures are unremarkable. IMPRESSION: No active disease. Electronically Signed   By: Donavan Foil M.D.   On: 05/10/2018 19:20   Dg Foot Complete Left  Result Date: 05/10/2018 CLINICAL DATA:  Foot  wounds.  Evaluate for osteomyelitis. EXAM: LEFT FOOT - COMPLETE 3+ VIEW COMPARISON:  Foot MRI 01/04/2018 FINDINGS: Status post fifth ray resection. Prior fourth digit amputation. Partial amputations of the second and third digits. There is advanced erosive changes at the base of the fourth metatarsal. Irregularity is seen along the more medial Lisfranc joint with cystic type space at the base of the second metatarsal. Prominent erosion of the cuboid on the oblique view. Lateral midfoot ulcers. No tracking soft tissue gas. Osteopenia. IMPRESSION: Known midfoot osteomyelitis, reference foot MRI 01/04/2018. There is associated prominent erosion of the fourth metatarsal base and cuboid. Electronically Signed   By: Monte Fantasia M.D.   On: 05/10/2018 21:48    Procedures .Marland KitchenIncision and Drainage Date/Time: 05/10/2018 7:16 PM Performed by: Lorin Glass, PA-C Authorized by: Lorin Glass, PA-C   Consent:    Consent obtained:  Verbal   Consent given by:  Patient   Risks discussed:  Bleeding, incomplete drainage, pain and infection (Prolonged healing, poor or non healing wounds, )   Alternatives discussed:  No treatment, alternative treatment and referral (Drainage by surgery) Location:    Type:  Abscess   Size:  4x5   Location: Left lateral foot. Anesthesia (see MAR for exact dosages):    Anesthesia method:  None (Patient has poor sensation in feet.  Did not feel any sharp pain. Lidocaine offered, declined. ) Procedure type:    Complexity:  Complex Procedure details:    Needle aspiration: yes     Needle size:  18 G   Incision types:  Stab incision   Incision depth:  Subcutaneous   Scalpel blade:  11   Wound management:  Probed and deloculated, irrigated with saline and extensive cleaning   Drainage:  Bloody   Drainage amount:  Scant   Packing materials:  None Post-procedure details:    Patient tolerance of procedure:  Tolerated well, no immediate complications   CRITICAL  CARE Performed by: Wyn Quaker Total critical care time: 38 minutes Critical care time was exclusive of separately billable procedures and treating other patients. Critical care was necessary to treat or prevent imminent or life-threatening deterioration. Critical care was time spent personally by me on the following activities: development of treatment plan with patient and/or surrogate as well as nursing, discussions with consultants, evaluation of patient's response to treatment, examination of patient, obtaining history from patient or surrogate, ordering and performing treatments and interventions, ordering and review of laboratory studies, ordering and review of radiographic studies, pulse oximetry and re-evaluation of patient's condition.  Sepsis requiring I and D, Multiple antibiotics, mild hypotension in the 80s.     Medications Ordered in ED Medications  ceFAZolin (ANCEF) IVPB 1 g/50 mL premix (0 g Intravenous Stopped 05/10/18 1917)  HYDROcodone-acetaminophen (NORCO/VICODIN) 5-325 MG per tablet 1 tablet (has no administration in time range)  acetaminophen (TYLENOL) tablet 650 mg (has no administration in time range)  atorvastatin (LIPITOR) tablet 20 mg (has no administration in time range)  ferric citrate (AURYXIA) tablet 420 mg (has no administration in time range)  insulin aspart protamine- aspart (NOVOLOG MIX 70/30) injection 1 Units (has no administration in time range)  multivitamin with minerals tablet 1 tablet (has no administration in time range)  mupirocin ointment (BACTROBAN) 2 % 1 application (has no administration in time range)  primidone (MYSOLINE) tablet 50 mg (has no administration in time range)  timolol (TIMOPTIC) 0.5 % ophthalmic solution 1 drop (has no administration in time range)  vitamin C (ASCORBIC ACID) tablet 500 mg (has no administration in time range)  hydrALAZINE (APRESOLINE) injection 5 mg (has no administration in time range)  hydrOXYzine  (ATARAX/VISTARIL) tablet 10 mg (has no administration in time range)  zolpidem (AMBIEN) tablet 5 mg (has no administration in time range)  insulin aspart (novoLOG) injection 0-9 Units (has no administration in time range)  metroNIDAZOLE (FLAGYL) tablet 250 mg (has no administration in time range)  sodium chloride 0.9 % bolus 250 mL (0 mLs Intravenous Stopped 05/10/18 1914)  vancomycin (VANCOCIN) 2,000 mg in sodium chloride 0.9 % 500 mL IVPB (0 mg Intravenous Stopped 05/10/18 2112)     Initial Impression / Assessment and Plan / ED Course  I have reviewed the triage vital signs and the nursing notes.  Pertinent labs & imaging results that were available during my care of the patient were reviewed by me and considered in my medical decision making (see chart for details).  Clinical Course as of May 10 2330  Wed May 10, 2018  1940 Sepsis reassessment completed.    [EH]  2145 Spoke with Dr. Blaine Hamper who will admit the patient.    [EH]    Clinical Course User Index [EH] Lorin Glass, PA-C   Patient presents today for concern of a foot infection.  He was febrile and tachycardic at dialysis and received 1 g of Tylenol prior to arrival.  He has a long-standing history of nonhealing diabetic foot wounds.  Given diabetic foot wounds, fever recorded at dialysis along with tachycardia en route, code sepsis was called.  Wounds on his feet were examined, concern for fluid collection based on fluctuant area on the left lateral foot.  Verbal consent was obtained from patient, after which I&D was performed to this area.  After consultation with pharmacy patient was started on antibiotics.  White count  not significantly elevated, mild thrombocytopenia with platelets of 102.  Other labs appear consistent with his usual baseline.  Blood cultures were obtained.  He was given 500 mL of fluid in route by EMS, and his pressures remain in the mid 80s.  I gave him an additional 250 mL's normal saline which resulted in  improved blood pressure and patient started to feel better.  Chest x-ray was obtained, no acute abnormalities.  X-ray ordered.  I spoke with hospitalist who agreed to admit patient.  Pulses are able to be dopplered in bilateral lower extremities, I do not feel like he requires a vascular consultation tonight.  This patient was seen as a shared visit with Dr. Jeanell Sparrow who evaluated the patient.   Final Clinical Impressions(s) / ED Diagnoses   Final diagnoses:  Sepsis, due to unspecified organism [facility])    ED Discharge Orders    None       Ollen Gross 05/10/18 2336    Pattricia Boss, MD 05/11/18 2222

## 2018-05-11 ENCOUNTER — Ambulatory Visit (HOSPITAL_COMMUNITY): Admission: RE | Admit: 2018-05-11 | Payer: Medicare Other | Source: Ambulatory Visit | Admitting: Vascular Surgery

## 2018-05-11 ENCOUNTER — Encounter (HOSPITAL_COMMUNITY)
Admission: EM | Disposition: A | Discharge status: Home Health | Payer: Self-pay | Source: Home / Self Care | Attending: Family Medicine

## 2018-05-11 ENCOUNTER — Other Ambulatory Visit: Payer: Self-pay

## 2018-05-11 DIAGNOSIS — L97529 Non-pressure chronic ulcer of other part of left foot with unspecified severity: Secondary | ICD-10-CM

## 2018-05-11 DIAGNOSIS — L97519 Non-pressure chronic ulcer of other part of right foot with unspecified severity: Secondary | ICD-10-CM

## 2018-05-11 DIAGNOSIS — E1169 Type 2 diabetes mellitus with other specified complication: Secondary | ICD-10-CM

## 2018-05-11 DIAGNOSIS — Z8249 Family history of ischemic heart disease and other diseases of the circulatory system: Secondary | ICD-10-CM

## 2018-05-11 DIAGNOSIS — M869 Osteomyelitis, unspecified: Secondary | ICD-10-CM

## 2018-05-11 DIAGNOSIS — Z992 Dependence on renal dialysis: Secondary | ICD-10-CM

## 2018-05-11 DIAGNOSIS — B9562 Methicillin resistant Staphylococcus aureus infection as the cause of diseases classified elsewhere: Secondary | ICD-10-CM

## 2018-05-11 DIAGNOSIS — I120 Hypertensive chronic kidney disease with stage 5 chronic kidney disease or end stage renal disease: Secondary | ICD-10-CM

## 2018-05-11 DIAGNOSIS — E1122 Type 2 diabetes mellitus with diabetic chronic kidney disease: Secondary | ICD-10-CM

## 2018-05-11 DIAGNOSIS — Z87891 Personal history of nicotine dependence: Secondary | ICD-10-CM

## 2018-05-11 DIAGNOSIS — Z833 Family history of diabetes mellitus: Secondary | ICD-10-CM

## 2018-05-11 DIAGNOSIS — Z880 Allergy status to penicillin: Secondary | ICD-10-CM

## 2018-05-11 DIAGNOSIS — Z89429 Acquired absence of other toe(s), unspecified side: Secondary | ICD-10-CM

## 2018-05-11 DIAGNOSIS — E11621 Type 2 diabetes mellitus with foot ulcer: Secondary | ICD-10-CM

## 2018-05-11 DIAGNOSIS — I739 Peripheral vascular disease, unspecified: Secondary | ICD-10-CM

## 2018-05-11 DIAGNOSIS — N186 End stage renal disease: Secondary | ICD-10-CM

## 2018-05-11 DIAGNOSIS — E1151 Type 2 diabetes mellitus with diabetic peripheral angiopathy without gangrene: Secondary | ICD-10-CM

## 2018-05-11 DIAGNOSIS — E1142 Type 2 diabetes mellitus with diabetic polyneuropathy: Secondary | ICD-10-CM

## 2018-05-11 HISTORY — PX: PERIPHERAL VASCULAR INTERVENTION: CATH118257

## 2018-05-11 HISTORY — PX: LOWER EXTREMITY ANGIOGRAPHY: CATH118251

## 2018-05-11 HISTORY — PX: PERIPHERAL VASCULAR BALLOON ANGIOPLASTY: CATH118281

## 2018-05-11 LAB — GLUCOSE, CAPILLARY
GLUCOSE-CAPILLARY: 180 mg/dL — AB (ref 65–99)
Glucose-Capillary: 142 mg/dL — ABNORMAL HIGH (ref 65–99)
Glucose-Capillary: 140 mg/dL — ABNORMAL HIGH (ref 65–99)

## 2018-05-11 LAB — URINALYSIS, ROUTINE W REFLEX MICROSCOPIC
Bilirubin Urine: NEGATIVE
Glucose, UA: 50 mg/dL — AB
HGB URINE DIPSTICK: NEGATIVE
Ketones, ur: NEGATIVE mg/dL
Nitrite: NEGATIVE
PROTEIN: 100 mg/dL — AB
SPECIFIC GRAVITY, URINE: 1.017 (ref 1.005–1.030)
pH: 8 (ref 5.0–8.0)

## 2018-05-11 LAB — POCT ACTIVATED CLOTTING TIME: ACTIVATED CLOTTING TIME: 180 s

## 2018-05-11 LAB — BASIC METABOLIC PANEL
ANION GAP: 14 (ref 5–15)
BUN: 21 mg/dL — ABNORMAL HIGH (ref 6–20)
CHLORIDE: 95 mmol/L — AB (ref 101–111)
CO2: 28 mmol/L (ref 22–32)
Calcium: 7.8 mg/dL — ABNORMAL LOW (ref 8.9–10.3)
Creatinine, Ser: 7.67 mg/dL — ABNORMAL HIGH (ref 0.61–1.24)
GFR calc non Af Amer: 7 mL/min — ABNORMAL LOW (ref >60)
GFR, EST AFRICAN AMERICAN: 8 mL/min — AB (ref >60)
GLUCOSE: 178 mg/dL — AB (ref 65–99)
POTASSIUM: 4 mmol/L (ref 3.5–5.1)
Sodium: 137 mmol/L (ref 135–145)

## 2018-05-11 LAB — APTT: APTT: 32 s (ref 24–36)

## 2018-05-11 LAB — CBC
HEMATOCRIT: 37.6 % — AB (ref 39.0–52.0)
HEMOGLOBIN: 11.8 g/dL — AB (ref 13.0–17.0)
MCH: 30.5 pg (ref 26.0–34.0)
MCHC: 31.4 g/dL (ref 30.0–36.0)
MCV: 97.2 fL (ref 78.0–100.0)
Platelets: 187 10*3/uL (ref 150–400)
RBC: 3.87 MIL/uL — AB (ref 4.22–5.81)
RDW: 14.9 % (ref 11.5–15.5)
WBC: 7 10*3/uL (ref 4.0–10.5)

## 2018-05-11 LAB — PROTIME-INR
INR: 1.15
Prothrombin Time: 14.6 seconds (ref 11.4–15.2)

## 2018-05-11 LAB — CBG MONITORING, ED
GLUCOSE-CAPILLARY: 165 mg/dL — AB (ref 65–99)
Glucose-Capillary: 218 mg/dL — ABNORMAL HIGH (ref 65–99)

## 2018-05-11 LAB — PROCALCITONIN: PROCALCITONIN: 0.5 ng/mL

## 2018-05-11 LAB — LACTIC ACID, PLASMA: LACTIC ACID, VENOUS: 1.4 mmol/L (ref 0.5–1.9)

## 2018-05-11 LAB — SEDIMENTATION RATE: SED RATE: 57 mm/h — AB (ref 0–16)

## 2018-05-11 SURGERY — LOWER EXTREMITY ANGIOGRAPHY
Anesthesia: LOCAL | Laterality: Left

## 2018-05-11 MED ORDER — LABETALOL HCL 5 MG/ML IV SOLN: 10.0000 mg | INTRAVENOUS | Status: DC | PRN

## 2018-05-11 MED ORDER — OXYCODONE HCL 5 MG PO TABS: 5.0000 mg | ORAL_TABLET | ORAL | Status: DC | PRN

## 2018-05-11 MED ORDER — LIDOCAINE HCL (PF) 1 % IJ SOLN
INTRAMUSCULAR | Status: AC
  Filled 2018-05-11: qty 30

## 2018-05-11 MED ORDER — HEPARIN SODIUM (PORCINE) 5000 UNIT/ML IJ SOLN
5000.0000 [IU] | Freq: Three times a day (TID) | INTRAMUSCULAR | Status: DC
  Administered 2018-05-11 – 2018-05-14 (×9): 5000 [IU] via SUBCUTANEOUS
  Filled 2018-05-11 (×7): qty 1

## 2018-05-11 MED ORDER — CALCITRIOL 1 MCG/ML IV SOLN
3.0000 ug | INTRAVENOUS | Status: DC
  Administered 2018-05-12: 3 ug via INTRAVENOUS

## 2018-05-11 MED ORDER — CLOPIDOGREL BISULFATE 75 MG PO TABS
300.0000 mg | ORAL_TABLET | Freq: Once | ORAL | Status: AC
  Administered 2018-05-11: 300 mg via ORAL

## 2018-05-11 MED ORDER — SODIUM CHLORIDE 0.9 % IV SOLN
250.0000 mL | INTRAVENOUS | Status: DC | PRN
Start: 2018-05-11 — End: 2018-05-14

## 2018-05-11 MED ORDER — SODIUM CHLORIDE 0.9% FLUSH
3.0000 mL | Freq: Two times a day (BID) | INTRAVENOUS | Status: DC
  Administered 2018-05-11 – 2018-05-13 (×4): 3 mL via INTRAVENOUS

## 2018-05-11 MED ORDER — LIDOCAINE HCL (PF) 1 % IJ SOLN
INTRAMUSCULAR | Status: DC | PRN
  Administered 2018-05-11: 18 mL

## 2018-05-11 MED ORDER — IODIXANOL 320 MG/ML IV SOLN
INTRAVENOUS | Status: DC | PRN
  Administered 2018-05-11: 150 mL via INTRA_ARTERIAL

## 2018-05-11 MED ORDER — HEPARIN (PORCINE) IN NACL 2-0.9 UNITS/ML
INTRAMUSCULAR | Status: AC | PRN
  Administered 2018-05-11: 500 mL

## 2018-05-11 MED ORDER — SODIUM CHLORIDE 0.9% FLUSH: 3.0000 mL | INTRAVENOUS | Status: DC | PRN

## 2018-05-11 MED ORDER — CLOPIDOGREL BISULFATE 75 MG PO TABS
75.0000 mg | ORAL_TABLET | Freq: Every day | ORAL | Status: DC
  Administered 2018-05-12 – 2018-05-14 (×3): 75 mg via ORAL
  Filled 2018-05-11 (×3): qty 1

## 2018-05-11 MED ORDER — HEPARIN SODIUM (PORCINE) 1000 UNIT/ML IJ SOLN
INTRAMUSCULAR | Status: DC | PRN
  Administered 2018-05-11: 10000 [IU] via INTRAVENOUS

## 2018-05-11 MED ORDER — SODIUM CHLORIDE 0.9 % IV BOLUS
250.0000 mL | Freq: Once | INTRAVENOUS | Status: AC
  Administered 2018-05-11: 250 mL via INTRAVENOUS

## 2018-05-11 MED ORDER — SODIUM CHLORIDE 0.9 % IV SOLN
INTRAVENOUS | Status: DC
  Administered 2018-05-11: 04:00:00 via INTRAVENOUS

## 2018-05-11 MED ORDER — ONDANSETRON HCL 4 MG/2ML IJ SOLN: 4.0000 mg | Freq: Four times a day (QID) | INTRAMUSCULAR | Status: DC | PRN

## 2018-05-11 MED ORDER — CLOPIDOGREL BISULFATE 300 MG PO TABS
ORAL_TABLET | ORAL | Status: AC
  Filled 2018-05-11: qty 1

## 2018-05-11 MED ORDER — IBUPROFEN 200 MG PO TABS
600.0000 mg | ORAL_TABLET | Freq: Once | ORAL | Status: AC
  Administered 2018-05-11: 600 mg via ORAL
  Filled 2018-05-11: qty 1

## 2018-05-11 MED ORDER — HEPARIN (PORCINE) IN NACL 1000-0.9 UT/500ML-% IV SOLN
INTRAVENOUS | Status: AC
  Filled 2018-05-11: qty 1000

## 2018-05-11 MED ORDER — HEPARIN SODIUM (PORCINE) 1000 UNIT/ML IJ SOLN
INTRAMUSCULAR | Status: AC
  Filled 2018-05-11: qty 1

## 2018-05-11 SURGICAL SUPPLY — 28 items
BALLN LUTONIX DCB 4X60X130 (BALLOONS) ×2
BALLN STERLING OTW 3X40X150 (BALLOONS) ×2
BALLN STERLING OTW 4X40X135 (BALLOONS) ×2
BALLOON LUTONIX DCB 4X60X130 (BALLOONS) ×1 IMPLANT
BALLOON STERLING OTW 3X40X150 (BALLOONS) ×1 IMPLANT
BALLOON STERLING OTW 4X40X135 (BALLOONS) ×1 IMPLANT
CATH OMNI FLUSH 5F 65CM (CATHETERS) ×2 IMPLANT
CATH QUICKCROSS .035X135CM (MICROCATHETER) ×2 IMPLANT
CATH STRAIGHT 5FR 65CM (CATHETERS) ×2 IMPLANT
COVER PRB 48X5XTLSCP FOLD TPE (BAG) ×1 IMPLANT
COVER PROBE 5X48 (BAG) ×1
DEVICE CONTINUOUS FLUSH (MISCELLANEOUS) ×2 IMPLANT
DEVICE TORQUE .025-.038 (MISCELLANEOUS) ×2 IMPLANT
KIT ENCORE 26 ADVANTAGE (KITS) ×4 IMPLANT
KIT MICROPUNCTURE NIT STIFF (SHEATH) ×2 IMPLANT
KIT PV (KITS) ×2 IMPLANT
SHEATH AVANTI 11CM 7FR (SHEATH) ×2 IMPLANT
SHEATH FLEX ANSEL ANG 6F 45CM (SHEATH) ×2 IMPLANT
SHEATH PINNACLE 5F 10CM (SHEATH) ×2 IMPLANT
STENT SYNERGY DES 4X16 (Permanent Stent) ×2 IMPLANT
SYR MEDRAD MARK V 150ML (SYRINGE) ×2 IMPLANT
TRANSDUCER W/STOPCOCK (MISCELLANEOUS) ×2 IMPLANT
TRAY PV CATH (CUSTOM PROCEDURE TRAY) ×2 IMPLANT
WIRE AQUATRAK .035X260 ANG (WIRE) ×2 IMPLANT
WIRE BENTSON .035X145CM (WIRE) ×2 IMPLANT
WIRE G V18X300CM (WIRE) ×2 IMPLANT
WIRE ROSEN-J .035X260CM (WIRE) ×2 IMPLANT
WIRE SPARTACORE .014X300CM (WIRE) ×2 IMPLANT

## 2018-05-11 NOTE — ED Notes (Signed)
Checked patient cbg it was 165 notified RN of blood sugar patient is resting with call bell in reach 

## 2018-05-11 NOTE — ED Notes (Signed)
PT transported to cath lab.

## 2018-05-11 NOTE — Interval H&P Note (Signed)
   History and Physical Update  The patient was interviewed and re-examined.  The patient's previous History and Physical has been reviewed and is unchanged from Dr. Nicole Cella consult except for interval B leg runoff.  Today the plan is L leg runoff and possible tibioperoneal trunk intervention.   I discussed with the patient the nature of angiographic procedures, especially the limited patencies of any endovascular intervention.    The patient is aware of that the risks of an angiographic procedure include but are not limited to: bleeding, infection, access site complications, renal failure, embolization, rupture of vessel, dissection, arteriovenous fistula, possible need for emergent surgical intervention, possible need for surgical procedures to treat the patient's pathology, anaphylactic reaction to contrast, and stroke and death.    The patient is aware of the risks and agrees to proceed.   Adele Barthel, MD, FACS [facility] Office: [phone] Pager: [phone]  05/11/2018, 11:14 AM

## 2018-05-11 NOTE — ED Triage Notes (Signed)
PT reported he was going to surgery this AM and did not want meds or food . Pt reported the PA was in room and reported he was going to have a veinogram today.

## 2018-05-11 NOTE — Consult Note (Signed)
Reason for Consult:Foot ulcers Referring Physician: [name] is an 67 y.o. male.  HPI: [name] was sent to the ED from HD 2/2 high fevers. He was thought to be septic from his foot which has chronic ulcerations. He is currently an active pt of Dr. Berenice [name] and the wound care center. He notes that the left foot ulcer started to get bad again on Tuesday. No change in symptoms. He underwent I&D of left heel with evacuation of purulence.  Past Medical History:  Diagnosis Date  . Cardiomyopathy ([place])   . Cataract    right eye  . Diabetes mellitus    type 2  . Diabetic foot ulcer ([place])   . Diabetic infection of right foot ([place])   . Diabetic peripheral neuropathy ([place])   . ESRD (end stage renal disease) ([place])    m-w-f [facility] [address]  . Glaucoma    left eye  . Headache   . HTN (hypertension)   . Osteomyelitis ([place])   . Peripheral vascular disease ([place])    critical limb ischemia    Past Surgical History:  Procedure Laterality Date  . ABDOMINAL AORTOGRAM N/A 05/04/2018   Procedure: ABDOMINAL AORTOGRAM;  Surgeon: Conrad Whitwell, MD;  Location: [facility];  Service: Cardiovascular;  Laterality: N/A;  . AMPUTATION TOE Right 01/21/2017   Procedure: AMPUTATION 2ND TOE RIGHT FOOT ;  Surgeon: Dorna Leitz, MD;  Location: [facility];  Service: Orthopedics;  Laterality: Right;  . AMPUTATION TOE Left 05/18/2017   Procedure: 5TH RAY AMPUTATION  and EXCISION CUBOID;  Surgeon: Dorna Leitz, MD;  Location: [facility];  Service: Orthopedics;  Laterality: Left;  . AMPUTATION TOE Bilateral 01/27/2018   Procedure: RIGHT FOOT 3RD TOE AMPUTATION WITH INCISIONAL WOUND DEBRIDEMENT. LEFT FOOT 2ND TOE AMPUTATION AND INCISIONAL WOUND DEBRIDEMENT.;  Surgeon: Dorna Leitz, MD;  Location: [facility];  Service: Orthopedics;  Laterality: Bilateral;  . AV FISTULA PLACEMENT Left 11/27/2014   Procedure: ARTERIOVENOUS (AV) FISTULA CREATION;  Surgeon: Conrad Mentone, MD;  Location: [facility];   Service: Vascular;  Laterality: Left;  . CHOLECYSTECTOMY    . COLONOSCOPY N/A 11/28/2014   Procedure: COLONOSCOPY;  Surgeon: Ladene Artist, MD;  Location: [facility];  Service: Endoscopy;  Laterality: N/A;  . ESOPHAGOGASTRODUODENOSCOPY N/A 11/28/2014   Procedure: ESOPHAGOGASTRODUODENOSCOPY (EGD);  Surgeon: Ladene Artist, MD;  Location: [facility] ENDOSCOPY;  Service: Endoscopy;  Laterality: N/A;  . EYE SURGERY     retina and cataract left eye surgery  . I&D EXTREMITY  01/28/2012   Procedure: IRRIGATION AND DEBRIDEMENT EXTREMITY;  Surgeon: Paulene Floor, MD;  Location: [facility];  Service: Orthopedics;  Laterality: Left;  irrigation and drainage left hand  . I&D EXTREMITY  01/30/2012   Procedure: IRRIGATION AND DEBRIDEMENT EXTREMITY;  Surgeon: Paulene Floor, MD;  [facility]: [facility];  Service: Orthopedics;  Laterality: Left;  flexor teno synovectomy and extensor tenosynovectomy arthrosynovectomy wristjoint  . I&D EXTREMITY  02/01/2012   Procedure: IRRIGATION AND DEBRIDEMENT EXTREMITY;  Surgeon: Paulene Floor, MD;  [facility];  Service: Orthopedics;  Laterality: Left;  Left Wrist, Hand and Forearm  . IRRIGATION AND DEBRIDEMENT FOOT Left 01/21/2017   Procedure: DEBRIDEMENT LEFT PLANTAR WOUND;  Surgeon: Dorna Leitz, MD;  Location: [facility];  Service: Orthopedics;  Laterality: Left;  . Left 4th toe amputation    . LEFT AND RIGHT HEART CATHETERIZATION WITH CORONARY ANGIOGRAM N/A 11/26/2014   Procedure: LEFT AND RIGHT HEART CATHETERIZATION  WITH CORONARY ANGIOGRAM;  Surgeon: Leonie Man, MD;  Location: [facility] C[facility] LAB;  Service: Cardiovascular;  Laterality: N/A;  . LOWER EXTREMITY ANGIOGRAPHY Bilateral 05/04/2018   Procedure: Lower Extremity Angiography;  Surgeon: Conrad , MD;  Location: [facility];  Service: Cardiovascular;  Laterality: Bilateral;  . TONSILLECTOMY      Family History  Problem Relation Age of Onset  . Heart disease Mother   . Diabetes Father   . Diabetes Brother    . Diabetes Sister   . Diabetes Brother   . Diabetes Brother     Social History:  reports that he has quit smoking. His smoking use included cigarettes. He has a 2.00 pack-year smoking history. He has never used smokeless tobacco. He reports that he does not drink alcohol or use drugs.  Allergies:  Allergies  Allergen Reactions  . Penicillins Hives, Swelling and Other (See Comments)    Ankle swelling  PATIENT HAD A PCN REACTION WITH IMMEDIATE RASH, FACIAL/TONGUE/THROAT SWELLING, SOB, OR LIGHTHEADEDNESS WITH HYPOTENSION:  #  #  #  YES  #  #  #   Has patient had a PCN reaction causing severe rash involving mucus membranes or skin necrosis: no Has patient had a PCN reaction that required hospitalization: no Has patient had a PCN reaction occurring within the last 10 years: no If all of the above answers are "NO", then may proceed with Cephalosporin use.    Medications: I have reviewed the patient's current medications.  Results for orders placed or performed during the hospital encounter of 05/10/18 (from the past 48 hour(s))  Comprehensive metabolic panel     Status: Abnormal   Collection Time: 05/10/18  6:34 PM  Result Value Ref Range   Sodium 140 135 - 145 mmol/L   Potassium 4.2 3.5 - 5.1 mmol/L   Chloride 101 101 - 111 mmol/L   CO2 25 22 - 32 mmol/L   Glucose, Bld 189 (H) 65 - 99 mg/dL   BUN 14 6 - 20 mg/dL   Creatinine, Ser 5.03 (H) 0.61 - 1.24 mg/dL   Calcium 7.3 (L) 8.9 - 10.3 mg/dL   Total Protein 6.6 6.5 - 8.1 g/dL   Albumin 2.7 (L) 3.5 - 5.0 g/dL   AST 23 15 - 41 U/L   ALT 9 (L) 17 - 63 U/L   Alkaline Phosphatase 70 38 - 126 U/L   Total Bilirubin 1.2 0.3 - 1.2 mg/dL   GFR calc non Af Amer 11 (L) >60 mL/min   GFR calc Af Amer 13 (L) >60 mL/min    Comment: (NOTE) The eGFR has been calculated using the CKD EPI equation. This calculation has not been validated in all clinical situations. eGFR's persistently <60 mL/min signify possible Chronic Kidney Disease.     Anion gap 14 5 - 15    Comment: Performed at [facility] [address] [place], [place] [address]  CBC WITH DIFFERENTIAL     Status: Abnormal   Collection Time: 05/10/18  6:34 PM  Result Value Ref Range   WBC 7.3 4.0 - 10.5 K/uL   RBC 3.92 (L) 4.22 - 5.81 MIL/uL   Hemoglobin 11.9 (L) 13.0 - 17.0 g/dL   HCT 37.5 (L) 39.0 - 52.0 %   MCV 95.7 78.0 - 100.0 fL   MCH 30.4 26.0 - 34.0 pg   MCHC 31.7 30.0 - 36.0 g/dL   RDW 14.8 11.5 - 15.5 %   Platelets 102 (L) 150 - 400  K/uL    Comment: REPEATED TO VERIFY SPECIMEN CHECKED FOR CLOTS PLATELET COUNT CONFIRMED BY SMEAR    Neutrophils Relative % 63 %   Neutro Abs 4.6 1.7 - 7.7 K/uL   Lymphocytes Relative 17 %   Lymphs Abs 1.2 0.7 - 4.0 K/uL   Monocytes Relative 14 %   Monocytes Absolute 1.0 0.1 - 1.0 K/uL   Eosinophils Relative 6 %   Eosinophils Absolute 0.5 0.0 - 0.7 K/uL   Basophils Relative 0 %   Basophils Absolute 0.0 0.0 - 0.1 K/uL   Immature Granulocytes 0 %   Abs Immature Granulocytes 0.0 0.0 - 0.1 K/uL    Comment: Performed at [facility] [address], [place], [place] [address]  C-reactive protein     Status: Abnormal   Collection Time: 05/10/18  6:34 PM  Result Value Ref Range   CRP 13.8 (H) <1.0 mg/dL    Comment: Performed at [facility] [address] [place], [place] [address]  Blood Culture (routine x 2)     Status: None (Preliminary result)   Collection Time: 05/10/18  6:36 PM  Result Value Ref Range   Specimen Description BLOOD SITE NOT SPECIFIED    Special Requests      BOTTLES DRAWN AEROBIC ONLY Blood Culture adequate volume   Culture PENDING    Report Status PENDING   I-Stat CG4 Lactic Acid, ED  (not at  [facility])     Status: None   Collection Time: 05/10/18  6:41 PM  Result Value Ref Range   Lactic Acid, Venous 1.20 0.5 - 1.9 mmol/L  I-Stat CG4 Lactic Acid, ED  (not at  [facility])     Status: None   Collection Time: 05/10/18  8:35 PM  Result Value Ref Range   Lactic Acid, Venous 1.59 0.5  - 1.9 mmol/L  Sedimentation rate     Status: Abnormal   Collection Time: 05/10/18 11:32 PM  Result Value Ref Range   Sed Rate 57 (H) 0 - 16 mm/hr    Comment: Performed at [facility] [address], [place], [place] [address]  Lactic acid, plasma     Status: None   Collection Time: 05/10/18 11:32 PM  Result Value Ref Range   Lactic Acid, Venous 1.4 0.5 - 1.9 mmol/L    Comment: Performed at [facility] [address] [place], [place] [address]  Procalcitonin     Status: None   Collection Time: 05/10/18 11:32 PM  Result Value Ref Range   Procalcitonin 0.50 ng/mL    Comment:        Interpretation: PCT (Procalcitonin) <= 0.5 ng/mL: Systemic infection (sepsis) is not likely. Local bacterial infection is possible. (NOTE)       Sepsis PCT Algorithm           Lower Respiratory Tract                                      Infection PCT Algorithm    ----------------------------     ----------------------------         PCT < 0.25 ng/mL                PCT < 0.10 ng/mL         Strongly encourage             Strongly discourage   discontinuation of antibiotics  initiation of antibiotics    ----------------------------     -----------------------------       PCT 0.25 - 0.50 ng/mL            PCT 0.10 - 0.25 ng/mL               OR       >80% decrease in PCT            Discourage initiation of                                            antibiotics      Encourage discontinuation           of antibiotics    ----------------------------     -----------------------------         PCT >= 0.50 ng/mL              PCT 0.26 - 0.50 ng/mL               AND        <80% decrease in PCT             Encourage initiation of                                             antibiotics       Encourage continuation           of antibiotics    ----------------------------     -----------------------------        PCT >= 0.50 ng/mL                  PCT > 0.50 ng/mL               AND         increase  in PCT                  Strongly encourage                                      initiation of antibiotics    Strongly encourage escalation           of antibiotics                                     -----------------------------                                           PCT <= 0.25 ng/mL                                                 OR                                        >  80% decrease in PCT                                     Discontinue / Do not initiate                                             antibiotics Performed at [facility], [place] [address], [place], [place] [address]   Protime-INR     Status: None   Collection Time: 05/10/18 11:32 PM  Result Value Ref Range   Prothrombin Time 14.6 11.4 - 15.2 seconds   INR 1.15     Comment: Performed at [facility] [address], [place], [place] [address]  APTT     Status: None   Collection Time: 05/10/18 11:32 PM  Result Value Ref Range   aPTT 32 24 - 36 seconds    Comment: Performed at [facility] [address], [place], [place] [address]  CBG monitoring, ED     Status: Abnormal   Collection Time: 05/11/18 12:03 AM  Result Value Ref Range   Glucose-Capillary 218 (H) 65 - 99 mg/dL  Basic metabolic panel     Status: Abnormal   Collection Time: 05/11/18  6:16 AM  Result Value Ref Range   Sodium 137 135 - 145 mmol/L   Potassium 4.0 3.5 - 5.1 mmol/L   Chloride 95 (L) 101 - 111 mmol/L   CO2 28 22 - 32 mmol/L   Glucose, Bld 178 (H) 65 - 99 mg/dL   BUN 21 (H) 6 - 20 mg/dL   Creatinine, Ser 7.67 (H) 0.61 - 1.24 mg/dL    Comment: DELTA CHECK NOTED   Calcium 7.8 (L) 8.9 - 10.3 mg/dL   GFR calc non Af Amer 7 (L) >60 mL/min   GFR calc Af Amer 8 (L) >60 mL/min    Comment: (NOTE) The eGFR has been calculated using the CKD EPI equation. This calculation has not been validated in all clinical situations. eGFR's persistently <60 mL/min signify possible Chronic Kidney Disease.    Anion gap 14 5 - 15     Comment: Performed at [facility] [address], [place], [place] [address]  CBC     Status: Abnormal   Collection Time: 05/11/18  6:16 AM  Result Value Ref Range   WBC 7.0 4.0 - 10.5 K/uL   RBC 3.87 (L) 4.22 - 5.81 MIL/uL   Hemoglobin 11.8 (L) 13.0 - 17.0 g/dL   HCT 37.6 (L) 39.0 - 52.0 %   MCV 97.2 78.0 - 100.0 fL   MCH 30.5 26.0 - 34.0 pg   MCHC 31.4 30.0 - 36.0 g/dL   RDW 14.9 11.5 - 15.5 %   Platelets 187 150 - 400 K/uL    Comment: Performed at [facility], [place] [address] [place], [place] [address]  CBG monitoring, ED     Status: Abnormal   Collection Time: 05/11/18  8:25 AM  Result Value Ref Range   Glucose-Capillary 165 (H) 65 - 99 mg/dL    Dg Chest Port 1 View  Result Date: 05/10/2018 CLINICAL DATA:  Sepsis EXAM: PORTABLE CHEST 1 VIEW COMPARISON:  01/27/2018 FINDINGS: The heart size and mediastinal contours are within normal limits. Both lungs are clear. Mild aortic  atherosclerosis. The visualized skeletal structures are unremarkable. IMPRESSION: No active disease. Electronically Signed   By: Donavan Foil M.D.   On: 05/10/2018 19:20   Dg Foot Complete Left  Result Date: 05/10/2018 CLINICAL DATA:  Foot wounds.  Evaluate for osteomyelitis. EXAM: LEFT FOOT - COMPLETE 3+ VIEW COMPARISON:  Foot MRI 01/04/2018 FINDINGS: Status post fifth ray resection. Prior fourth digit amputation. Partial amputations of the second and third digits. There is advanced erosive changes at the base of the fourth metatarsal. Irregularity is seen along the more medial Lisfranc joint with cystic type space at the base of the second metatarsal. Prominent erosion of the cuboid on the oblique view. Lateral midfoot ulcers. No tracking soft tissue gas. Osteopenia. IMPRESSION: Known midfoot osteomyelitis, reference foot MRI 01/04/2018. There is associated prominent erosion of the fourth metatarsal base and cuboid. Electronically Signed   By: Monte Fantasia M.D.   On: 05/10/2018 21:48    Review  of Systems  Constitutional: Positive for fever. Negative for weight loss.  HENT: Negative for ear discharge, ear pain, hearing loss and tinnitus.   Eyes: Negative for blurred vision, double vision, photophobia and pain.  Respiratory: Negative for cough, sputum production and shortness of breath.   Cardiovascular: Negative for chest pain.  Gastrointestinal: Negative for abdominal pain, nausea and vomiting.  Genitourinary: Negative for dysuria, flank pain, frequency and urgency.  Musculoskeletal: Negative for back pain, falls, joint pain, myalgias and neck pain.  Neurological: Negative for dizziness, tingling, sensory change, focal weakness, loss of consciousness and headaches.  Endo/Heme/Allergies: Does not bruise/bleed easily.  Psychiatric/Behavioral: Negative for depression, memory loss and substance abuse. The patient is not nervous/anxious.    Blood pressure 135/71, pulse 75, temperature 99.5 F (37.5 C), temperature source Oral, resp. rate (!) 9, height _0  (1.854 m), weight 95.3 kg (210 lb), SpO2 98 %. Physical Exam  Constitutional: He appears well-developed and well-nourished. No distress.  HENT:  Head: Normocephalic and atraumatic.  Eyes: Conjunctivae are normal. Right eye exhibits no discharge. Left eye exhibits no discharge. No scleral icterus.  Neck: Normal range of motion.  Cardiovascular: Normal rate and regular rhythm.  Respiratory: Effort normal. No respiratory distress.  Musculoskeletal:  RLE S/p multiple amputations, chronic ulcerations noted (see pics)  Nontender  No knee or ankle effusion  Knee stable to varus/ valgus and anterior/posterior stress  Sens DPN, SPN, TN paresthetic  Motor EHL, ext, flex, evers 5/5  DP 2+, PT 2+, No significant edema  LLE S/p multiple amputations, chronic ulcerations noted (see pics), I&D site lateral heel open without expressible purulence  No knee or ankle effusion  Knee stable to varus/ valgus and anterior/posterior stress  Sens  DPN, SPN, TN paresthetic  Motor EHL, ext, flex, evers 5/5  DP 2+, PT 2+, No significant edema  Neurological: He is alert.  Skin: Skin is warm and dry. He is not diaphoretic.  Psychiatric: He has a normal mood and affect. His behavior is normal.        Assessment/Plan: Chronic foot ulcerations with underlying known osteo on left -- No acute orthopedic intervention planned. Agree with IV abx and transition to oral when directed by cultures or empirically. Continue treatment at wound center as planned. Await vascular study, may change treatment plan if impaired but then would be directed by [facility]. F/u with Dr. Berenice [name] as OP as scheduled once discharged. Inpatient wound care directed by [facility] RN.     Lisette Abu, PA-C Orthopedic Surgery [phone] 05/11/2018, 10:05 AM

## 2018-05-11 NOTE — Plan of Care (Signed)
Care plans reviewed and patient is progressing.  

## 2018-05-11 NOTE — Consult Note (Signed)
[facility]  Total days of antibiotics 1               Reason for Consult: diabetic foot. osteo    Referring Physician: [email or web address]-[email or web address]  Principal Problem:   Diabetic foot infection ([place]) Active Problems:   Anemia due to end stage renal disease (HCC)   Benign essential HTN   Peripheral arterial disease (HCC)   Osteomyelitis of left foot (HCC)   ESRD on hemodialysis (HCC)   Type II diabetes mellitus with renal manifestations (HCC)   Sepsis (HCC)   HLD (hyperlipidemia)    HPI: [name] NUNN Sr. is a 67 y.o. male with HTN, DM, ESRD on HD, PVD and hx of diabetic foot ulcers/osteo c/b peripheral neuropathy/idabetic insensate s/p previous toe amputations admitted for fever, chills, worsening wounds to left foot. He is also followed by Dr [name] in [facility], Dr Bridgett Larsson from vascular surgery. Patient had recently finished long course of IV vanco and ceftaz through HD and oral abtx up until 6 wk ago. Continued to do wound care management of chronic bilateral diabetic foot ulcers. This past week had purulence expressed from lateral area of left foot and prescribed oral cipro but unclear if he started taking it. Followed by chills tthis week but when he was seen in HD he had fevers and referred to ED for further evaluation. On admit had temp 101, HR in 90s. But hypotensive where he responded to IVF boluses. He was started on vanco, cefazolin, metronidazole and evaluated by Dr Bridgett Larsson where he under left leg vascular assessment with angioplasty of left TP trunck plus stent placement and angioplasty of PTA.  He had recent MRI of RIGHT FOOT that did not suggest osteo  Past Medical History:  Diagnosis Date  . Cardiomyopathy ([place])   . Cataract    right eye  . Diabetes mellitus    type 2  . Diabetic foot ulcer ([place])   . Diabetic infection of right foot ([place])   . Diabetic peripheral neuropathy ([place])   . ESRD (end stage renal disease) ([place])    m-w-f [facility]  [address]  . Glaucoma    left eye  . Headache   . HTN (hypertension)   . Osteomyelitis ([place])   . Peripheral vascular disease ([place])    critical limb ischemia    Allergies:  Allergies  Allergen Reactions  . Penicillins Hives, Swelling and Other (See Comments)    Ankle swelling  PATIENT HAD A PCN REACTION WITH IMMEDIATE RASH, FACIAL/TONGUE/THROAT SWELLING, SOB, OR LIGHTHEADEDNESS WITH HYPOTENSION:  #  #  #  YES  #  #  #   Has patient had a PCN reaction causing severe rash involving mucus membranes or skin necrosis: no Has patient had a PCN reaction that required hospitalization: no Has patient had a PCN reaction occurring within the last 10 years: no If all of the above answers are "NO", then may proceed with Cephalosporin use.     MEDICATIONS: . atorvastatin  20 mg Oral QHS  . [START ON 05/12/2018] clopidogrel  75 mg Oral Q breakfast  . ferric citrate  420 mg Oral TID WC  . insulin aspart  0-5 Units Subcutaneous QHS  . insulin aspart  0-9 Units Subcutaneous TID WC  . metroNIDAZOLE  500 mg Oral Q8H  . multivitamin with minerals  1 tablet Oral Daily  . primidone  50 mg Oral QHS  . timolol  1 drop Left Eye BID  .  vitamin C  500 mg Oral Once per day on Mon Thu    Social History   Tobacco Use  . Smoking status: Former Smoker    Packs/day: 0.50    Years: 4.00    Pack years: 2.00    Types: Cigarettes  . Smokeless tobacco: Never Used  . Tobacco comment: about 40 yrs ago  Substance Use Topics  . Alcohol use: No    Alcohol/week: 0.0 oz  . Drug use: No    Family History  Problem Relation Age of Onset  . Heart disease Mother   . Diabetes Father   . Diabetes Brother   . Diabetes Sister   . Diabetes Brother   . Diabetes Brother     Review of Systems -   Constitutional: + for fever, chills, but no diaphoresis, activity change, appetite change, fatigue and unexpected weight change.  HENT: Negative for congestion, sore throat, rhinorrhea, sneezing, trouble  swallowing and sinus pressure.  Eyes: Negative for photophobia and visual disturbance.  Respiratory: Negative for cough, chest tightness, shortness of breath, wheezing and stridor.  Cardiovascular: Negative for chest pain, palpitations and leg swelling.  Gastrointestinal: Negative for nausea, vomiting, abdominal pain, diarrhea, constipation, blood in stool, abdominal distention and anal bleeding.  Genitourinary: Negative for dysuria, hematuria, flank pain and difficulty urinating.  Musculoskeletal: Negative for myalgias, back pain, joint swelling, arthralgias and gait problem.  Skin: +wounds Neurological: Negative for dizziness, tremors, weakness and light-headedness.  Hematological: Negative for adenopathy. Does not bruise/bleed easily.  Psychiatric/Behavioral: Negative for behavioral problems, confusion, sleep disturbance, dysphoric mood, decreased concentration and agitation.      OBJECTIVE: Temp:  [98.4 F (36.9 C)-102.9 F (39.4 C)] 98.4 F (36.9 C) (05/16 1555) Pulse Rate:  [70-115] 81 (05/16 1555) Resp:  [0-134] 18 (05/16 1555) BP: (81-164)/(47-101) 143/54 (05/16 1520) SpO2:  [90 %-100 %] 98 % (05/16 1555) Weight:  [210 lb (95.3 kg)] 210 lb (95.3 kg) (05/15 1742) Physical Exam  Constitutional: He is oriented to person, place, and time. He appears well-developed and well-nourished. No distress.  HENT:  Mouth/Throat: Oropharynx is clear and moist. No oropharyngeal exudate.  Cardiovascular: Normal rate, regular rhythm and normal heart sounds. Exam reveals no gallop and no friction rub.  No murmur heard.  Pulmonary/Chest: Effort normal and breath sounds normal. No respiratory distress. He has no wheezes.  Abdominal: Soft. Bowel sounds are normal. He exhibits no distension. There is no tenderness.  Lymphadenopathy:  He has no cervical adenopathy.  Neurological: He is alert and oriented to person, place, and time. Decreased sensation to feet bilaterally Skin: left foot has heavy  callous to plantar and lateral aspect. Fluctuance to lateral heel region no purulence expressed. Plantar and 2nd lateral decub ulcer with yellowish drainage on dressing. Right foot has plantar ulcer at ball of foot Psychiatric: He has a normal mood and affect. His behavior is normal.     LABS: Results for orders placed or performed during the hospital encounter of 05/10/18 (from the past 48 hour(s))  Comprehensive metabolic panel     Status: Abnormal   Collection Time: 05/10/18  6:34 PM  Result Value Ref Range   Sodium 140 135 - 145 mmol/L   Potassium 4.2 3.5 - 5.1 mmol/L   Chloride 101 101 - 111 mmol/L   CO2 25 22 - 32 mmol/L   Glucose, Bld 189 (H) 65 - 99 mg/dL   BUN 14 6 - 20 mg/dL   Creatinine, Ser 5.03 (H) 0.61 - 1.24 mg/dL  Calcium 7.3 (L) 8.9 - 10.3 mg/dL   Total Protein 6.6 6.5 - 8.1 g/dL   Albumin 2.7 (L) 3.5 - 5.0 g/dL   AST 23 15 - 41 U/L   ALT 9 (L) 17 - 63 U/L   Alkaline Phosphatase 70 38 - 126 U/L   Total Bilirubin 1.2 0.3 - 1.2 mg/dL   GFR calc non Af Amer 11 (L) >60 mL/min   GFR calc Af Amer 13 (L) >60 mL/min    Comment: (NOTE) The eGFR has been calculated using the CKD EPI equation. This calculation has not been validated in all clinical situations. eGFR's persistently <60 mL/min signify possible Chronic Kidney Disease.    Anion gap 14 5 - 15    Comment: Performed at [facility] [address] [place], [place] [address]  CBC WITH DIFFERENTIAL     Status: Abnormal   Collection Time: 05/10/18  6:34 PM  Result Value Ref Range   WBC 7.3 4.0 - 10.5 K/uL   RBC 3.92 (L) 4.22 - 5.81 MIL/uL   Hemoglobin 11.9 (L) 13.0 - 17.0 g/dL   HCT 37.5 (L) 39.0 - 52.0 %   MCV 95.7 78.0 - 100.0 fL   MCH 30.4 26.0 - 34.0 pg   MCHC 31.7 30.0 - 36.0 g/dL   RDW 14.8 11.5 - 15.5 %   Platelets 102 (L) 150 - 400 K/uL    Comment: REPEATED TO VERIFY SPECIMEN CHECKED FOR CLOTS PLATELET COUNT CONFIRMED BY SMEAR    Neutrophils Relative % 63 %   Neutro Abs 4.6 1.7 - 7.7 K/uL    Lymphocytes Relative 17 %   Lymphs Abs 1.2 0.7 - 4.0 K/uL   Monocytes Relative 14 %   Monocytes Absolute 1.0 0.1 - 1.0 K/uL   Eosinophils Relative 6 %   Eosinophils Absolute 0.5 0.0 - 0.7 K/uL   Basophils Relative 0 %   Basophils Absolute 0.0 0.0 - 0.1 K/uL   Immature Granulocytes 0 %   Abs Immature Granulocytes 0.0 0.0 - 0.1 K/uL    Comment: Performed at [facility], [address], [place], [place] [address]  C-reactive protein     Status: Abnormal   Collection Time: 05/10/18  6:34 PM  Result Value Ref Range   CRP 13.8 (H) <1.0 mg/dL    Comment: Performed at [facility] [address], [place], [place] [address]  Blood Culture (routine x 2)     Status: None (Preliminary result)   Collection Time: 05/10/18  6:36 PM  Result Value Ref Range   Specimen Description BLOOD SITE NOT SPECIFIED    Special Requests      BOTTLES DRAWN AEROBIC ONLY Blood Culture adequate volume   Culture PENDING    Report Status PENDING   I-Stat CG4 Lactic Acid, ED  (not at  [facility])     Status: None   Collection Time: 05/10/18  6:41 PM  Result Value Ref Range   Lactic Acid, Venous 1.20 0.5 - 1.9 mmol/L  Blood Culture (routine x 2)     Status: None (Preliminary result)   Collection Time: 05/10/18  6:42 PM  Result Value Ref Range   Specimen Description BLOOD SITE NOT SPECIFIED    Special Requests      BOTTLES DRAWN AEROBIC ONLY Blood Culture adequate volume   Culture      NO GROWTH < 24 HOURS Performed at [facility], [place] [address] [place], [place] [address]    Report  Status PENDING   I-Stat CG4 Lactic Acid, ED  (not at  [facility])     Status: None   Collection Time: 05/10/18  8:35 PM  Result Value Ref Range   Lactic Acid, Venous 1.59 0.5 - 1.9 mmol/L  Sedimentation rate     Status: Abnormal   Collection Time: 05/10/18 11:32 PM  Result Value Ref Range   Sed Rate 57 (H) 0 - 16 mm/hr    Comment: Performed at [facility] [address], [place], [place] [address]  Lactic  acid, plasma     Status: None   Collection Time: 05/10/18 11:32 PM  Result Value Ref Range   Lactic Acid, Venous 1.4 0.5 - 1.9 mmol/L    Comment: Performed at [facility] [address] [place], [place] [address]  Procalcitonin     Status: None   Collection Time: 05/10/18 11:32 PM  Result Value Ref Range   Procalcitonin 0.50 ng/mL    Comment:        Interpretation: PCT (Procalcitonin) <= 0.5 ng/mL: Systemic infection (sepsis) is not likely. Local bacterial infection is possible. (NOTE)       Sepsis PCT Algorithm           Lower Respiratory Tract                                      Infection PCT Algorithm    ----------------------------     ----------------------------         PCT < 0.25 ng/mL                PCT < 0.10 ng/mL         Strongly encourage             Strongly discourage   discontinuation of antibiotics    initiation of antibiotics    ----------------------------     -----------------------------       PCT 0.25 - 0.50 ng/mL            PCT 0.10 - 0.25 ng/mL               OR       >80% decrease in PCT            Discourage initiation of                                            antibiotics      Encourage discontinuation           of antibiotics    ----------------------------     -----------------------------         PCT >= 0.50 ng/mL              PCT 0.26 - 0.50 ng/mL               AND        <80% decrease in PCT             Encourage initiation of                                             antibiotics  Encourage continuation           of antibiotics    ----------------------------     -----------------------------        PCT >= 0.50 ng/mL                  PCT > 0.50 ng/mL               AND         increase in PCT                  Strongly encourage                                      initiation of antibiotics    Strongly encourage escalation           of antibiotics                                     -----------------------------                                            PCT <= 0.25 ng/mL                                                 OR                                        > 80% decrease in PCT                                     Discontinue / Do not initiate                                             antibiotics Performed at [facility], [address], [place], [place] [address]   Protime-INR     Status: None   Collection Time: 05/10/18 11:32 PM  Result Value Ref Range   Prothrombin Time 14.6 11.4 - 15.2 seconds   INR 1.15     Comment: Performed at [facility] [address], [place], [place] [address]  APTT     Status: None   Collection Time: 05/10/18 11:32 PM  Result Value Ref Range   aPTT 32 24 - 36 seconds    Comment: Performed at [facility] [address], [place], [place] [address]  CBG monitoring, ED     Status: Abnormal   Collection Time: 05/11/18 12:03 AM  Result Value Ref Range   Glucose-Capillary 218 (H) 65 - 99 mg/dL  Basic metabolic panel     Status: Abnormal   Collection Time: 05/11/18  6:16 AM  Result Value Ref Range   Sodium 137 135 - 145 mmol/L   Potassium 4.0 3.5 - 5.1  mmol/L   Chloride 95 (L) 101 - 111 mmol/L   CO2 28 22 - 32 mmol/L   Glucose, Bld 178 (H) 65 - 99 mg/dL   BUN 21 (H) 6 - 20 mg/dL   Creatinine, Ser 7.67 (H) 0.61 - 1.24 mg/dL    Comment: DELTA CHECK NOTED   Calcium 7.8 (L) 8.9 - 10.3 mg/dL   GFR calc non Af Amer 7 (L) >60 mL/min   GFR calc Af Amer 8 (L) >60 mL/min    Comment: (NOTE) The eGFR has been calculated using the CKD EPI equation. This calculation has not been validated in all clinical situations. eGFR's persistently <60 mL/min signify possible Chronic Kidney Disease.    Anion gap 14 5 - 15    Comment: Performed at [facility] [address] [place], [place] [address]  CBC     Status: Abnormal   Collection Time: 05/11/18  6:16 AM  Result Value Ref Range   WBC 7.0 4.0 - 10.5 K/uL   RBC 3.87 (L) 4.22 - 5.81 MIL/uL   Hemoglobin  11.8 (L) 13.0 - 17.0 g/dL   HCT 37.6 (L) 39.0 - 52.0 %   MCV 97.2 78.0 - 100.0 fL   MCH 30.5 26.0 - 34.0 pg   MCHC 31.4 30.0 - 36.0 g/dL   RDW 14.9 11.5 - 15.5 %   Platelets 187 150 - 400 K/uL    Comment: Performed at [facility], [place] [address], [place], [place] [address]  CBG monitoring, ED     Status: Abnormal   Collection Time: 05/11/18  8:25 AM  Result Value Ref Range   Glucose-Capillary 165 (H) 65 - 99 mg/dL  Glucose, capillary     Status: Abnormal   Collection Time: 05/11/18  2:49 PM  Result Value Ref Range   Glucose-Capillary 142 (H) 65 - 99 mg/dL  POCT Activated clotting time     Status: None   Collection Time: 05/11/18  2:49 PM  Result Value Ref Range   Activated Clotting Time 180 seconds    MICRO: 5/14wound cx with MRSA (s tetra R bactrim) IMAGING: Dg Chest Port 1 View  Result Date: 05/10/2018 CLINICAL DATA:  Sepsis EXAM: PORTABLE CHEST 1 VIEW COMPARISON:  01/27/2018 FINDINGS: The heart size and mediastinal contours are within normal limits. Both lungs are clear. Mild aortic atherosclerosis. The visualized skeletal structures are unremarkable. IMPRESSION: No active disease. Electronically Signed   By: Donavan Foil M.D.   On: 05/10/2018 19:20   Dg Foot Complete Left  Result Date: 05/10/2018 CLINICAL DATA:  Foot wounds.  Evaluate for osteomyelitis. EXAM: LEFT FOOT - COMPLETE 3+ VIEW COMPARISON:  Foot MRI 01/04/2018 FINDINGS: Status post fifth ray resection. Prior fourth digit amputation. Partial amputations of the second and third digits. There is advanced erosive changes at the base of the fourth metatarsal. Irregularity is seen along the more medial Lisfranc joint with cystic type space at the base of the second metatarsal. Prominent erosion of the cuboid on the oblique view. Lateral midfoot ulcers. No tracking soft tissue gas. Osteopenia. IMPRESSION: Known midfoot osteomyelitis, reference foot MRI 01/04/2018. There is associated prominent erosion of the fourth  metatarsal base and cuboid. Electronically Signed   By: Monte Fantasia M.D.   On: 05/10/2018 21:48    Assessment/Plan:  40NU M with DM, DFU/osteo hx, ESRD on HD admitted for fevers, recent simple bedside I x D of left foot abscess. cx showing MRSA  - continue on vancomycin post HD -  can stop cefazolin and metronidazole - recommend to coordinate LEFT foot MRI to see if needs further debridement - plan for minimum of 2 wk of IV tx but may need to extend further based on MRI results - will follow his blood cx   ESRD = will renally dose his vancomycin  DFU= continue with wound care recs  [facility] [phone]

## 2018-05-11 NOTE — Op Note (Signed)
OPERATIVE NOTE   PROCEDURE: 1.  right common femoral artery cannulation under ultrasound guidance 2.  Third order arterial selection 3.  Left leg runoff off 4.  Bland angioplasty of left tibioperoneal trunk (3 mm x 40 mm) 5.  Drug coated angioplasty of left tibioperoneal trunk (Lutonix 4 mm x 60 mm) 6.  Drug eluting stent placement in left tibioperoneal trunk (Synergy 4 mm x 40 mm) 7.  Bland angioplasty of posterior tibial artery x 2 (3 mm x 40 mm)  PRE-OPERATIVE DIAGNOSIS: bilateral foot wounds  POST-OPERATIVE DIAGNOSIS: same as above   SURGEON: Adele Barthel, MD  ANESTHESIA: conscious sedation  ESTIMATED BLOOD LOSS: 50 cc  CONTRAST: 150 cc  FINDING(S):  Sub-total occlusion left tibioperoneal trunk: serial angioplasty resulted in flow limiting dissection, resolved with placement   Patent left posterior tibial artery: sub-total occlusion in distal 1/3 (50% residual after angioplasty, no dissection), serial sub-total occlusions in distal segment (residual stenosis difficult to determine due to residual spasm, no dissection)  Patent left peroneal artery: 50-75% proximal stenosis, patent otherwise  Proximal anterior tibial artery which occludes shortly after take off.  Distally dorsalis pedis artery reconstitutes via collaterals from posterior tibial artery and peroneal artery  SPECIMEN(S):  none  INDICATIONS:   [name]. is a 67 y.o. male who presents with bilateral foot ulcer.  Prior angiography demonstrated a sub-total occlusion of left tibioperoneal trunk compromising the only two runoff vessels to the left foot.  The patient presents for: left leg runoff and possible tibial artery intervention.  I discussed with the patient the nature of angiographic procedures, especially the limited patencies of any endovascular intervention.  The patient is aware of that the risks of an angiographic procedure include but are not limited to: bleeding, infection, access site  complications, renal failure, embolization, rupture of vessel, dissection, possible need for emergent surgical intervention, possible need for surgical procedures to treat the patient's pathology, and stroke and death.  The patient is aware of the risks and agrees to proceed.  DESCRIPTION: After full informed consent was obtained from the patient, the patient was brought back to the angiography suite.  The patient was placed supine upon the angiography table and connected to cardiopulmonary monitoring equipment.  The patient was then given conscious sedation, the amounts of which are documented in the patient's chart.  A circulating radiologic technician maintained continuous monitoring of the patient's cardiopulmonary status.  Additionally, the control room radiologic technician provided backup monitoring throughout the procedure.  The patient was prepped and drape in the standard fashion for an angiographic procedure.  At this point, attention was turned to the right groin.  Under ultrasound guidance, the subcutaneous tissue surrounding the right common femoral artery was anesthesized with 1% lidocaine with epinephrine.  Under Sonosite guidance, the patency of the artery was noted to be: widely patent without calcification.  Ultrasound image was permanently recorded.  The artery was then cannulated with a micropuncture needle.  The microwire was advanced into the iliac arterial system.  The needle was exchanged for a microsheath, which was loaded into the common femoral artery over the wire.  The microwire was exchanged for a Bentson wire which was advanced into the aorta.  The microsheath was then exchanged for a 5-Fr sheath which was loaded into the common femoral artery.  The Omniflush catheter was then loaded over the wire up to the level of L3.  Using a Bentson wire and Omniflush catheter, the left common iliac artery was selected.  The wire advanced into the internal iliac artery, so I exchanged the  wire for a Glidewire, which I advanced into the left common femoral artery.  The catheter was exchanged for a straight catheter.  I steered the wire into the superficial femoral artery and placed the catheter into the left superficial femoral artery.  I exchanged the wire for a Rosen wire.    The patient's right femoral sheath was exchanged for a 6-Fr Ansel sheath, which was lodged in the left superficial femoral artery.  The dilator was removed.  The patient was given 10000 units of Heparin intravenously, which was a therapeutic bolus.    Using a Quickcross catheter and Rosen wire, I traversed the entire left superficial femoral artery.  I removed the wire and did a hand injection to image the below-the-knee popliteal artery and proximal tibial arteries.  Using a V-18 wire, I selected the tibioperoneal trunk and advanced the catheter into the posterior tibial artery.  I placed the V-18 in the mid-segment posterior tibial artery.  I removed the catheter and then centered a 3 mm x 40 mm bland angioplasty on the tibioperoneal trunk.  This was inflated to 6 atm for 1 minute.  I then obtained a drug coated 4 mm x 60 mm Lutonix balloon and inflated the balloon with 5 mm extension into the posterior tibial artery at 6 atm for 2 minutes.  I deflated and removed the balloon.  Hand injection demonstrated a small dissection.  I placed a bland 4 mm x 40 mm angioplasty balloon at the dissection site and inflated the balloon at 3 atm for 3 minutes.  The balloon was deflated and removed.  Hand injection demonstrated a flow limiting dissection.  I selected a Synergy 4 mm x 40 mm drug eluting stent and centered it on the dissected tibioperoneal trunk.  This was deployed at 11 atm for 1 minute.  The stent delivery balloon was deflated and removed.  Hand injection demonstrated resolution of the dissection and stenosis.  Flow into the posterior tibial artery and peroneal artery looked improved.  I then started imaging the rest  of the tibial arteries with hand injections.  A sub-total occlusion in the posterior tibial artery was imaged.  I centered a 3 mm x 40 mm bland angioplasty balloon and inflated it at 6 atm for 1 minute.  The balloon was deflated and removed.  There was ~50% residual stenosis.  I did not think it was safe to repeat angioplasty due to concerns with dissection.  On further imaging more distally, two serial sub-total occlusions above the level of the ankle were discovered.  I centered the 3 mm x 40 mm balloon on this segment.  The balloon was inflated to 6 atm for 2 minutes.  I deflated the balloon and removed it.  Completion imaging demonstrated more rapid perfusion into the foot but there appeared to be some spasm in the distal posterior tibial artery.  To avoid risk of dissection, I elected to avoid repeat angioplasty.  The final runoff status is as documented above.  I removed all wires and pulled the right sheath into the right external iliac artery.  There was some ooze around the sheath, so a Bentson wire was placed into the iliac arterial segment.  The right sheath was exchanged for a 7-Fr sheath.  The sheath was aspirated.  No clots were present and the sheath was reloaded with heparinized saline.     COMPLICATIONS: none  CONDITION: stable  Adele Barthel, MD, FACS [facility] Office: [phone] Pager: [phone]  05/11/2018, 2:42 PM

## 2018-05-11 NOTE — ED Triage Notes (Signed)
No information or orders for veinogram .

## 2018-05-11 NOTE — Progress Notes (Signed)
PROGRESS NOTE    Patient: [name] CALLANDER Sr.     PCP: [name], MD                    DOB: 12/20/1951            DOA: 05/10/2018 ONG:[id number]             DOS: 05/11/2018, 11:11 AM   LOS: 1 day   Date of Service: The patient was seen and examined on 05/11/2018  Subjective:  Patient was seen and examined this morning, stable no acute distress, afebrile, still blood pressure is in lower side.  He denies any shortness of breath chest pain.  He has not had a fever this morning. Stating he is n.p.o. as he is anticipating for questionable angiogram study by Dr. Bridgett Larsson.  We have not seen any note or orders. Otherwise he is stable both feet are unwrapped, wounds are examined. ------------------------------------------------------------------------------------------------------------------------------------  Brief Narrative:  [name] Sages Sr. is a 67 y.o. male with medical history significant of hypertension, hyperlipidemia, diabetes mellitus, ESRD-HD (MWF), PVD, s/p of amputation of multiple toes, anemia, who presents with left foot infection and fever and chills.   He has a chronic foot ulcer/wound, currently one ulcer in the lateral side of left foot, 2 small ulcer in the right foot bottom.  Pt states that Dr. Bridgett Larsson of [facility] plans to do angiogram for his legs due to PAD tomorrow. Patient had hypotension with blood pressure 81/54. He was given 500 cc of NS in dialysis center and 250 cc normal saline in ED, his blood pressure improved to 162/83.  In ED he  was found to have WBC 7.3, lactic acid 1.20, 1.59, potassium 4.2, bicarbonate 25, creatinine 5.03, BUN 14, temperature 101.3, heart rate in 90s, tachypnea, oxygen satting 90 to 97% on room air, negative chest x-ray.  X-ray of left foot showed no osteomyelitis.    Principal Problem:   Diabetic foot infection ([place]) Active Problems:   Anemia due to end stage renal disease (HCC)   Benign essential HTN   Peripheral arterial disease (HCC)  Osteomyelitis of left foot (HCC)   ESRD on hemodialysis (HCC)   Type II diabetes mellitus with renal manifestations (HCC)   Sepsis (HCC)   HLD (hyperlipidemia)   Assessment & Plan:   Sepsis due to diabetic foot infection, osteomyelitis of left foot:  On admission patient met admission criteria of sepsis with fever, tachycardia, tachypnea, hypotensive. -Patient received total of 750 mL of normal saline, will continue normal saline at 50 mL an hour, blood pressure still soft, will continue to monitor -He is a hemodialysis patient, will avoid volume overload -Temp this morning 99.5, T-max 102.9, WBC 7, lactic acid 1.4, procalcitonin 0.5 -We will follow with blood cultures -We will continue IV antibiotics of vancomycin/Ancef/Flagyl -Record indicates patient had ended up prolonged IV antibiotics on April 11 -Infectious disease team Dr. Graylon Good has been consulted -On-call orthopedic team has been consulted.  Appreciate their input   Anemia due to end stage renal disease [facility]):  Hemoglobin stable, 11.9 on admission -F/u by CBC  Benign essential HTN:  -Patient is mildly hypotensive, will monitor closely -Patient is not on any home medication for blood pressure  -IV hydralazine as needed  Peripheral arterial disease ([place]):  -[facility], Dr. Bridgett Larsson is planing to do angiogram   ESRD on hemodialysis (MWF):  -We will consult nephrology  Type II diabetes mellitus with renal manifestations and PVD:  -Last A1c 6.7  on 5/21, well controled. Patient is taking 70/30 insulin and SSI at home -SSI  HLD (hyperlipidemia); -lipitor  DVT ppx: SCD Code Status: Full code Family Communication:    Yes, patient's wife at bed side Disposition Plan:  Anticipate discharge back to previous home environment Consults called:  none Admission status: Inpatient/tele      Consultants:   ID  Ortho  Nephro  Procedures:    Per ortho--  Antimicrobials:  Anti-infectives (From admission, onward)    Start     Dose/Rate Route Frequency Ordered Stop   05/10/18 2359  metroNIDAZOLE (FLAGYL) tablet 500 mg     500 mg Oral Every 8 hours 05/10/18 2246     05/10/18 1900  vancomycin (VANCOCIN) 2,000 mg in sodium chloride 0.9 % 500 mL IVPB     2,000 mg 250 mL/hr over 120 Minutes Intravenous  Once 05/10/18 1834 05/10/18 2112   05/10/18 1900  ceFAZolin (ANCEF) IVPB 1 g/50 mL premix     1 g 100 mL/hr over 30 Minutes Intravenous Every 24 hours 05/10/18 1834     05/10/18 1815  aztreonam (AZACTAM) 2 g in sodium chloride 0.9 % 100 mL IVPB  Status:  Discontinued     2 g 200 mL/hr over 30 Minutes Intravenous  Once 05/10/18 1809 05/10/18 1825   05/10/18 1815  metroNIDAZOLE (FLAGYL) IVPB 500 mg  Status:  Discontinued     500 mg 100 mL/hr over 60 Minutes Intravenous  Once 05/10/18 1809 05/10/18 1825   05/10/18 1815  vancomycin (VANCOCIN) IVPB 1000 mg/200 mL premix  Status:  Discontinued     1,000 mg 200 mL/hr over 60 Minutes Intravenous  Once 05/10/18 1809 05/10/18 1829      Objective: Vitals:   05/11/18 0700 05/11/18 0730 05/11/18 0800 05/11/18 0900  BP: 93/60 (!) 103/50 (!) 102/56 135/71  Pulse: 70 71 74 75  Resp:  (!) 9    Temp:      TempSrc:      SpO2: 98% 97% 97% 98%  Weight:      Height:        Intake/Output Summary (Last 24 hours) at 05/11/2018 1111 Last data filed at 05/11/2018 0503 Gross per 24 hour  Intake 1300.07 ml  Output -  Net 1300.07 ml   Filed Weights   05/10/18 1742  Weight: 95.3 kg (210 lb)    Examination:  General exam: Appears calm and comfortable  Psychiatry: Judgement and insight appear normal. Mood & affect appropriate. HEENT: WNLs Respiratory system: Clear to auscultation. Respiratory effort normal. Cardiovascular system: S1 & S2 heard, RRR. No JVD, murmurs, rubs, gallops or clicks. No pedal edema. Gastrointestinal system: Abd. nondistended, soft and nontender. No organomegaly or masses felt. Normal bowel sounds heard. Central nervous system: Alert and  oriented. No focal neurological deficits. Extremities: Symmetric 5 x 5 power.  Bilateral feet, toes amputated with except external fourth and fifth on the lef.  Right third, fourth and fifth is missing. Skin: No rashes, bilateral acute on chronic foot ulcers with previous toes amputated,.  Left lateral side and sole of the foot swelling, fluctuant, minimal discharge, 2 small ulcers on the bottom of the right foot, dry no drainage.   Data Reviewed: I have personally reviewed following labs and imaging studies  CBC: Recent Labs  Lab 05/04/18 1140 05/10/18 1834 05/11/18 0616  WBC  --  7.3 7.0  NEUTROABS  --  4.6  --   HGB 13.9 11.9* 11.8*  HCT 41.0 37.5* 37.6*  MCV  --  95.7 97.2  PLT  --  102* 665   Basic Metabolic Panel: Recent Labs  Lab 05/04/18 1140 05/10/18 1834 05/11/18 0616  NA 137 140 137  K 4.5 4.2 4.0  CL 95* 101 95*  CO2  --  25 28  GLUCOSE 135* 189* 178*  BUN 31* 14 21*  CREATININE 8.20* 5.03* 7.67*  CALCIUM  --  7.3* 7.8*   GFR: Estimated Creatinine Clearance: 10.7 mL/min (A) (by C-G formula based on SCr of 7.67 mg/dL (H)). Liver Function Tests: Recent Labs  Lab 05/10/18 1834  AST 23  ALT 9*  ALKPHOS 70  BILITOT 1.2  PROT 6.6  ALBUMIN 2.7*    Recent Labs  Lab 05/10/18 2332  INR 1.15   Cardiac Enzymes: No results for input(s): CKTOTAL, CKMB, CKMBINDEX, TROPONINI in the last 168 hours. BNP (last 3 results) No results for input(s): PROBNP in the last 8760 hours. HbA1C: No results for input(s): HGBA1C in the last 72 hours. CBG: Recent Labs  Lab 05/04/18 1326 05/11/18 0003 05/11/18 0825  GLUCAP 108* 218* 165*   LSepsis Labs: Recent Labs  Lab 05/10/18 1841 05/10/18 2035 05/10/18 2332  PROCALCITON  --   --  0.50  LATICACIDVEN 1.20 1.59 1.4    Recent Results (from the past 240 hour(s))  Aerobic Culture (superficial specimen)     Status: None (Preliminary result)   Collection Time: 05/09/18  1:05 PM  Result Value Ref Range Status    Specimen Description   Final    FOOT LEFT Performed at [facility], [place] [address] [place], [place] [address]    Special Requests   Final    NONE Performed at [facility], [place] [address], [place], [place] [address]    Gram Stain   Final    RARE WBC PRESENT, PREDOMINANTLY PMN FEW GRAM POSITIVE COCCI    Culture   Final    FEW STAPHYLOCOCCUS AUREUS SUSCEPTIBILITIES TO FOLLOW Performed at [facility], [place] [address], [place], [place] [address]    Report Status PENDING  Incomplete  Blood Culture (routine x 2)     Status: None (Preliminary result)   Collection Time: 05/10/18  6:36 PM  Result Value Ref Range Status   Specimen Description BLOOD SITE NOT SPECIFIED  Final   Special Requests   Final    BOTTLES DRAWN AEROBIC ONLY Blood Culture adequate volume   Culture PENDING  Incomplete   Report Status PENDING  Incomplete      Radiology Studies: Dg Chest Port 1 View  Result Date: 05/10/2018 CLINICAL DATA:  Sepsis EXAM: PORTABLE CHEST 1 VIEW COMPARISON:  01/27/2018 FINDINGS: The heart size and mediastinal contours are within normal limits. Both lungs are clear. Mild aortic atherosclerosis. The visualized skeletal structures are unremarkable. IMPRESSION: No active disease. Electronically Signed   By: Donavan Foil M.D.   On: 05/10/2018 19:20   Dg Foot Complete Left  Result Date: 05/10/2018 CLINICAL DATA:  Foot wounds.  Evaluate for osteomyelitis. EXAM: LEFT FOOT - COMPLETE 3+ VIEW COMPARISON:  Foot MRI 01/04/2018 FINDINGS: Status post fifth ray resection. Prior fourth digit amputation. Partial amputations of the second and third digits. There is advanced erosive changes at the base of the fourth metatarsal. Irregularity is seen along the more medial Lisfranc joint with cystic type space at the base of the second metatarsal. Prominent erosion of the cuboid on the oblique view. Lateral midfoot ulcers. No tracking soft tissue gas. Osteopenia.  IMPRESSION: Known midfoot osteomyelitis,  reference foot MRI 01/04/2018. There is associated prominent erosion of the fourth metatarsal base and cuboid. Electronically Signed   By: Monte Fantasia M.D.   On: 05/10/2018 21:48    Scheduled Meds: . atorvastatin  20 mg Oral QHS  . ferric citrate  420 mg Oral TID WC  . insulin aspart  0-5 Units Subcutaneous QHS  . insulin aspart  0-9 Units Subcutaneous TID WC  . metroNIDAZOLE  500 mg Oral Q8H  . multivitamin with minerals  1 tablet Oral Daily  . primidone  50 mg Oral QHS  . timolol  1 drop Left Eye BID  . vitamin C  500 mg Oral Once per day on Mon Thu   Continuous Infusions: . sodium chloride 50 mL/hr at 05/11/18 0402  .  ceFAZolin (ANCEF) IV Stopped (05/10/18 1917)    Time spent: >25 minutes  Deatra James, MD [facility],  Pager [phone]  If 7PM-7AM, please contact night-coverage [email or web address]   Password [email or web address]  05/11/2018, 11:11 AM

## 2018-05-11 NOTE — Consult Note (Signed)
[facility] Nurse wound consult note Reason for Consult:bilateral foot ulcers, full thickness Wound type: arterial insufficiency Pressure Injury POA: NA Measurement:Right lateral foot just has callous. Right plantar surface of ball of foot 1cm x .75cm x 0.1cm and 0.5cm x 0.5cm x 0.1cm open areas with dark red wound beds, scant serosanguinous drainage, no odor noted. The left foot has a 0.2cm x 1cm x 0.1cm wound on plantar surface at base of 5th MT. Has red wound bed with scant serosanguinous drainage, no odor. The left lateral foot has a 1.6cm x 1.4cm x 0.1 cm wound with 80% pale pink wound bed and 20% yellow. This wound has had a drainage of abscess in ED and drainage site dry, no bleeding noted.  Wound bed:see above Drainage (amount, consistency, odor) see above Periwound: intact Dressing procedure/placement/frequency: I have provided nurses with orders for Xeroform to wounds while awaiting further testing of Peripheral Vascular Catheterization today. If no other procedures performed continue with Xeroform.  Patient goes to Wound Clinic weekly on Tuesday for debridements. Patient is a diabetic, his nutritional status is borderline at best. Patient could benefit from a Nutritional Consult for nutritional supplements to assist in wound healing., please order if you agree. We will not follow, but will remain available to this patient, to nursing, and the medical and/or surgical teams.  Please re-consult if we need to assist further.  Fara Olden, RN-C, WTA-C, [place] [facility]

## 2018-05-11 NOTE — ED Notes (Signed)
Pt to cath lab at this time

## 2018-05-11 NOTE — Progress Notes (Addendum)
Site area: Right groin a 6 french arterial sheath was removed  Site Prior to Removal:  Level 0  Pressure Applied For 25 MINUTES    Bedrest Beginning at 1520pm  Manual:   Yes.    Patient Status During Pull:  stable  Post Pull Groin Site:  Level 0  Post Pull Instructions Given:  Yes.    Post Pull Pulses Present:  Yes.    Dressing Applied:  Yes.    Comments:  VS stable

## 2018-05-12 ENCOUNTER — Telehealth: Payer: Self-pay | Admitting: Vascular Surgery

## 2018-05-12 ENCOUNTER — Encounter (HOSPITAL_COMMUNITY): Payer: Self-pay | Admitting: Vascular Surgery

## 2018-05-12 ENCOUNTER — Inpatient Hospital Stay (HOSPITAL_COMMUNITY): Payer: Medicare Other

## 2018-05-12 LAB — AEROBIC CULTURE W GRAM STAIN (SUPERFICIAL SPECIMEN)

## 2018-05-12 LAB — CBC
HCT: 35.3 % — ABNORMAL LOW (ref 39.0–52.0)
Hemoglobin: 11.2 g/dL — ABNORMAL LOW (ref 13.0–17.0)
MCH: 30.7 pg (ref 26.0–34.0)
MCHC: 31.7 g/dL (ref 30.0–36.0)
MCV: 96.7 fL (ref 78.0–100.0)
PLATELETS: 185 10*3/uL (ref 150–400)
RBC: 3.65 MIL/uL — AB (ref 4.22–5.81)
RDW: 14.9 % (ref 11.5–15.5)
WBC: 7.5 10*3/uL (ref 4.0–10.5)

## 2018-05-12 LAB — BASIC METABOLIC PANEL
Anion gap: 14 (ref 5–15)
BUN: 33 mg/dL — AB (ref 6–20)
CHLORIDE: 98 mmol/L — AB (ref 101–111)
CO2: 27 mmol/L (ref 22–32)
CREATININE: 10.19 mg/dL — AB (ref 0.61–1.24)
Calcium: 7.8 mg/dL — ABNORMAL LOW (ref 8.9–10.3)
GFR, EST AFRICAN AMERICAN: 5 mL/min — AB (ref >60)
GFR, EST NON AFRICAN AMERICAN: 5 mL/min — AB (ref >60)
Glucose, Bld: 135 mg/dL — ABNORMAL HIGH (ref 65–99)
Potassium: 4.3 mmol/L (ref 3.5–5.1)
SODIUM: 139 mmol/L (ref 135–145)

## 2018-05-12 LAB — GLUCOSE, CAPILLARY
Glucose-Capillary: 118 mg/dL — ABNORMAL HIGH (ref 65–99)
Glucose-Capillary: 113 mg/dL — ABNORMAL HIGH (ref 65–99)
Glucose-Capillary: 161 mg/dL — ABNORMAL HIGH (ref 65–99)
Glucose-Capillary: 169 mg/dL — ABNORMAL HIGH (ref 65–99)
Glucose-Capillary: 143 mg/dL — ABNORMAL HIGH (ref 65–99)

## 2018-05-12 LAB — AEROBIC CULTURE  (SUPERFICIAL SPECIMEN)

## 2018-05-12 LAB — POCT ACTIVATED CLOTTING TIME
ACTIVATED CLOTTING TIME: 373 s
Activated Clotting Time: 213 seconds

## 2018-05-12 MED ORDER — VANCOMYCIN HCL IN DEXTROSE 1-5 GM/200ML-% IV SOLN
INTRAVENOUS | Status: AC
  Administered 2018-05-12: 1000 mg via INTRAVENOUS
  Filled 2018-05-12: qty 200

## 2018-05-12 MED ORDER — VANCOMYCIN HCL IN DEXTROSE 1-5 GM/200ML-% IV SOLN
1000.0000 mg | INTRAVENOUS | Status: DC
  Administered 2018-05-12: 1000 mg via INTRAVENOUS

## 2018-05-12 MED ORDER — CALCITRIOL 1 MCG/ML IV SOLN
INTRAVENOUS | Status: AC
  Administered 2018-05-12: 3 ug via INTRAVENOUS
  Filled 2018-05-12: qty 4

## 2018-05-12 MED ORDER — PENTAFLUOROPROP-TETRAFLUOROETH EX AERO
INHALATION_SPRAY | CUTANEOUS | Status: AC
  Filled 2018-05-12: qty 103.5

## 2018-05-12 MED ORDER — CALCITRIOL 1 MCG/ML IV SOLN
INTRAVENOUS | Status: AC
  Filled 2018-05-12: qty 3

## 2018-05-12 NOTE — Plan of Care (Signed)
  Problem: Education: Goal: Knowledge of General Education information will improve Outcome: Progressing   Problem: Health Behavior/Discharge Planning: Goal: Ability to manage health-related needs will improve Outcome: Progressing   Problem: Clinical Measurements: Goal: Ability to maintain clinical measurements within normal limits will improve Outcome: Progressing Goal: Will remain free from infection Outcome: Progressing Goal: Diagnostic test results will improve Outcome: Progressing Goal: Respiratory complications will improve Outcome: Progressing Goal: Cardiovascular complication will be avoided Outcome: Progressing   Problem: Activity: Goal: Risk for activity intolerance will decrease Outcome: Progressing   Problem: Coping: Goal: Level of anxiety will decrease Outcome: Progressing   Problem: Elimination: Goal: Will not experience complications related to urinary retention Outcome: Progressing   Problem: Pain Managment: Goal: General experience of comfort will improve Outcome: Progressing   Problem: Safety: Goal: Ability to remain free from injury will improve Outcome: Progressing   Problem: Skin Integrity: Goal: Risk for impaired skin integrity will decrease Outcome: Progressing

## 2018-05-12 NOTE — Telephone Encounter (Signed)
Sched appt 06/07/18 at 8:45. Spoke to pt to inform them of appt.

## 2018-05-12 NOTE — Procedures (Signed)
   I was present at this dialysis session, have reviewed the session itself and made  appropriate changes Kelly Splinter MD [place] pager [phone]   05/12/2018, 12:40 PM

## 2018-05-12 NOTE — Progress Notes (Signed)
[facility] for Infectious Disease    Date of Admission:  05/10/2018   Total days of antibiotics 3        Day 3vanco           ID: [name] Sages Sr. is a 67 y.o. male with  DM, ESRD on HD, PAD with chronic bilateral diabetic foot wounds, previously treated for osteomyelitis Principal Problem:   Diabetic foot infection ([place]) Active Problems:   Anemia due to end stage renal disease ([place])   Benign essential HTN   Peripheral arterial disease ([place])   Osteomyelitis of left foot ([place])   ESRD on hemodialysis ([place])   Type II diabetes mellitus with renal manifestations ([place])   Sepsis ([place])   HLD (hyperlipidemia)    Subjective: Afebrile. Tolerated angioplasty. Fatigue from HD session today. Had MRI of foot  Medications:  . atorvastatin  20 mg Oral QHS  . calcitRIOL  3 mcg Intravenous Q M,W,F-HD  . clopidogrel  75 mg Oral Q breakfast  . ferric citrate  420 mg Oral TID WC  . heparin  5,000 Units Subcutaneous Q8H  . insulin aspart  0-5 Units Subcutaneous QHS  . insulin aspart  0-9 Units Subcutaneous TID WC  . multivitamin with minerals  1 tablet Oral Daily  . primidone  50 mg Oral QHS  . sodium chloride flush  3 mL Intravenous Q12H  . timolol  1 drop Left Eye BID  . vitamin C  500 mg Oral Once per day on Mon Thu    Objective: Vital signs in last 24 hours: Temp:  [98.3 F (36.8 C)-99.7 F (37.6 C)] (P) 98.3 F (36.8 C) (05/17 1130) Pulse Rate:  [79-92] (P) 88 (05/17 1130) Resp:  [15-24] (P) 22 (05/17 1130) BP: (92-143)/(58-74) (P) 130/62 (05/17 1130) SpO2:  [96 %-100 %] (P) 98 % (05/17 1130) Weight:  [206 lb 9.1 oz (93.7 kg)-212 lb 4.9 oz (96.3 kg)] (P) 206 lb 9.1 oz (93.7 kg) (05/17 1206) Physical Exam  Constitutional: He is oriented to person, place, and time. He appears well-developed and well-nourished. No distress.  HENT:  Mouth/Throat: Oropharynx is clear and moist. No oropharyngeal exudate.  Cardiovascular: Normal rate, regular rhythm and normal heart sounds.  Exam reveals no gallop and no friction rub.  No murmur heard.  Pulmonary/Chest: Effort normal and breath sounds normal. No respiratory distress. He has no wheezes.  Abdominal: Soft. Bowel sounds are normal. He exhibits no distension. There is no tenderness.  Lymphadenopathy:  He has no cervical adenopathy.  Neurological: He is alert and oriented to person, place, and time.  Skin: Skin is warm and dry. No rash noted. No erythema. Feet wrapped bilaterally Psychiatric: He has a normal mood and affect. His behavior is normal.     Lab Results Recent Labs    05/11/18 0616 05/12/18 0424  WBC 7.0 7.5  HGB 11.8* 11.2*  HCT 37.6* 35.3*  NA 137 139  K 4.0 4.3  CL 95* 98*  CO2 28 27  BUN 21* 33*  CREATININE 7.67* 10.19*   Liver Panel Recent Labs    05/10/18 1834  PROT 6.6  ALBUMIN 2.7*  AST 23  ALT 9*  ALKPHOS 70  BILITOT 1.2   Sedimentation Rate Recent Labs    05/10/18 2332  ESRSEDRATE 57*   C-Reactive Protein Recent Labs    05/10/18 1834  CRP 13.8*    Microbiology: L foot wound MRSA  Blood cx NGTD 5/15 Studies/Results: Dg Chest [facility]  Result Date: 05/10/2018 CLINICAL DATA:  Sepsis EXAM: PORTABLE CHEST 1 VIEW COMPARISON:  01/27/2018 FINDINGS: The heart size and mediastinal contours are within normal limits. Both lungs are clear. Mild aortic atherosclerosis. The visualized skeletal structures are unremarkable. IMPRESSION: No active disease. Electronically Signed   By: Donavan Foil M.D.   On: 05/10/2018 19:20   Dg Foot Complete Left  Result Date: 05/10/2018 CLINICAL DATA:  Foot wounds.  Evaluate for osteomyelitis. EXAM: LEFT FOOT - COMPLETE 3+ VIEW COMPARISON:  Foot MRI 01/04/2018 FINDINGS: Status post fifth ray resection. Prior fourth digit amputation. Partial amputations of the second and third digits. There is advanced erosive changes at the base of the fourth metatarsal. Irregularity is seen along the more medial Lisfranc joint with cystic type space at the  base of the second metatarsal. Prominent erosion of the cuboid on the oblique view. Lateral midfoot ulcers. No tracking soft tissue gas. Osteopenia. IMPRESSION: Known midfoot osteomyelitis, reference foot MRI 01/04/2018. There is associated prominent erosion of the fourth metatarsal base and cuboid. Electronically Signed   By: Monte Fantasia M.D.   On: 05/10/2018 21:48   4/29 MRI of right foot no osteo Jan 2019 MRI of left foot - +osteo  Assessment/Plan: MRSA Left Foot soft tissue abscess= continue to treat with IV vancomycin post HD. If his MRI is unchanged from January, plan to just treat for 14 days with IV vanco post HD. If it appears that it is worsened, would retreat with vancomycin x 6 wks.  PAD= angioplasty of left vascular should also facilitate healing  Will arrange for follow up in ID clinic in 2-4 wk with Dr hatcher  [facility] for Infectious Diseases Cell: [phone] Pager: [phone]  05/12/2018, 4:31 PM

## 2018-05-12 NOTE — Progress Notes (Signed)
   VASCULAR SURGERY ASSESSMENT & PLAN:   1 Day Post-Op s/p: Angioplasty and stenting of left tibioperoneal trunk yesterday and angioplasty of the left posterior tibial artery.  From a vascular standpoint I think he should have adequate circulation on the left to heal his wounds or whatever surgery he might require.  His arteriogram last week showed reasonable perfusion on the right with no options for revascularization that would significantly impact his distal perfusion.  I will arrange for a follow-up visit in 3 months in our office to follow his vascular status.  Orthopedics is managing his foot wounds.  Vascular surgery will be available as needed.  SUBJECTIVE:   No specific complaints.  PHYSICAL EXAM:   Vitals:   05/12/18 1030 05/12/18 1100 05/12/18 1130 05/12/18 1206  BP: 114/65 (!) (P) 108/57 (P) 130/62   Pulse: 89 (P) 87 (P) 88   Resp:   (!) (P) 22   Temp:   (P) 98.3 F (36.8 C)   TempSrc:   (P) Oral   SpO2:   (P) 98%   Weight:    (P) 206 lb 9.1 oz (93.7 kg)  Height:       I examined the wounds on both feet and they appear to be gradually improving.  LABS:   Lab Results  Component Value Date   WBC 7.5 05/12/2018   HGB 11.2 (L) 05/12/2018   HCT 35.3 (L) 05/12/2018   MCV 96.7 05/12/2018   PLT 185 05/12/2018   Lab Results  Component Value Date   CREATININE 10.19 (H) 05/12/2018   Lab Results  Component Value Date   INR 1.15 05/10/2018   CBG (last 3)  Recent Labs    05/12/18 0613 05/12/18 1221 05/12/18 1331  GLUCAP 118* 113* 161*    PROBLEM LIST:    Principal Problem:   Diabetic foot infection ([place]) Active Problems:   Anemia due to end stage renal disease ([place])   Benign essential HTN   Peripheral arterial disease ([place])   Osteomyelitis of left foot (H[facility])   ESRD on hemodialysis (H[facility])   Type II diabetes mellitus with renal manifestations (H[facility])   Sepsis (H[facility])   HLD (hyperlipidemia)   CURRENT MEDS:   . atorvastatin  20 mg Oral QHS  . calcitRIOL   3 mcg Intravenous Q M,W,F-HD  . clopidogrel  75 mg Oral Q breakfast  . ferric citrate  420 mg Oral TID WC  . heparin  5,000 Units Subcutaneous Q8H  . insulin aspart  0-5 Units Subcutaneous QHS  . insulin aspart  0-9 Units Subcutaneous TID WC  . multivitamin with minerals  1 tablet Oral Daily  . primidone  50 mg Oral QHS  . sodium chloride flush  3 mL Intravenous Q12H  . timolol  1 drop Left Eye BID  . vitamin C  500 mg Oral Once per day on Mon Thu    Deitra Mayo Beeper: [phone] Office: [phone] 05/12/2018

## 2018-05-12 NOTE — Telephone Encounter (Signed)
-----   Message from Penni Homans, RN sent at 05/11/2018  5:00 PM EDT ----- Regarding: Appointment   ----- Message ----- From: Conrad Erma, MD Sent: 05/11/2018   3:04 PM To: [address]  JESSIE SCHRIEBER Sr. [id number] 1951/11/24   PROCEDURE: 1.  right common femoral artery cannulation under ultrasound guidance 2.  Third order arterial selection 3.  Left leg runoff off 4.  Bland angioplasty of left tibioperoneal trunk (3 mm x 40 mm) 5.  Drug coated angioplasty of left tibioperoneal trunk (Lutonix 4 mm x 60 mm) 6.  Drug eluting stent placement in left tibioperoneal trunk (Synergy 4 mm x 40 mm) 7.  Bland angioplasty of posterior tibial artery x 2 (3 mm x 40 mm)  Follow-up: 4 weeks in office with Dr. Scot Dock

## 2018-05-12 NOTE — Progress Notes (Signed)
PROGRESS NOTE    Patient: [name].     PCP: [name], MD                    DOB: [birth year]            DOA: 05/10/2018 OEU:[id number]             DOS: 05/12/2018, 3:13 PM   LOS: 2 days   Date of Service: The patient was seen and examined on 05/12/2018  Subjective:   Day Post-Op day #1 s/p: Angioplasty and stenting of left tibioperoneal trunk yesterday and angioplasty of the left posterior tibial artery.  Patient was seen and examined in hemodialysis, also vancomycin infusion.  Currently he is afebrile, normotensive. No issues overnight. He tolerated procedure yesterday.  No complication. He was started on Plavix.  Was explained to the patient that we will pursue with MRI of his feet today. He expressed understanding and agreement to the plan.   ------------------------------------------------------------------------------------------------------------------------------------  Brief Narrative:  [name]. is a 67 y.o. male with medical history significant of hypertension, hyperlipidemia, diabetes mellitus, ESRD-HD (MWF), PVD, s/p of amputation of multiple toes, anemia, who presents with left foot infection and fever and chills.   He has a chronic foot ulcer/wound, currently one ulcer in the lateral side of left foot, 2 small ulcer in the right foot bottom.  Pt states that Dr. Bridgett Larsson of [facility] plans to do angiogram for his legs due to PAD tomorrow. Patient had hypotension with blood pressure 81/54. He was given 500 cc of NS in dialysis center and 250 cc normal saline in ED, his blood pressure improved to 162/83.  In ED he  was found to have WBC 7.3, lactic acid 1.20, 1.59, potassium 4.2, bicarbonate 25, creatinine 5.03, BUN 14, temperature 101.3, heart rate in 90s, tachypnea, oxygen satting 90 to 97% on room air, negative chest x-ray.  X-ray of left foot showed no osteomyelitis.    Principal Problem:   Diabetic foot infection ([place]) Active Problems:   Anemia  due to end stage renal disease (HCC)   Benign essential HTN   Peripheral arterial disease (HCC)   Osteomyelitis of left foot (HCC)   ESRD on hemodialysis (HCC)   Type II diabetes mellitus with renal manifestations (HCC)   Sepsis (HCC)   HLD (hyperlipidemia)   Assessment & Plan:   Sepsis due to diabetic foot infection, osteomyelitis of left foot:  On admission patient met admission criteria of sepsis with fever, tachycardia, tachypnea, hypotensive. -Patient received total of 750 mL of normal saline, will continue normal saline at 50 mL an hour, blood pressure still soft, will continue to monitor -He is a hemodialysis patient, will avoid volume overload -On admission T-max 102.9, WBC 7, lactic acid 1.4, procalcitonin 0.5 -Follow-up with blood and wound cultures, negative to date  (history of recent I&D and culture positive for MRSA)  -Record indicates patient had ended up prolonged IV antibiotics on April 11 -Infectious disease team Dr. [place] Flattery has been consulted -He has DC'd Ancef and Flagyl, continue IV vancomycin for now.,  -Recommending minimum of 2 weeks of IV treatment for now Recommending MRI of lower extremities -On-call orthopedic team following recommending no intervention at this time. -We will pursue an MRI of lower extremities   Anemia due to end stage renal disease [facility]):  Hemoglobin stable, 11.9 on admission -F/u by CBC  Benign essential HTN:  -Patient is mildly hypotensive, will monitor closely -Patient is not on  any home medication for blood pressure  -IV hydralazine as needed  Peripheral arterial disease ([place]):  -Day Post Op #1 s/p: Angioplasty and stenting of left tibioperoneal trunk yesterday and angioplasty of the left posterior tibial artery. -Vascular Dr. Doren Custard following -Added Plavix   ESRD on hemodialysis (MWF):  -We will consult nephrology  Type II diabetes mellitus with renal manifestations and PVD:  -Last A1c 6.7 on 5/21, well controled.  Patient is taking 70/30 insulin and SSI at home -SSI  HLD (hyperlipidemia); -lipitor  DVT ppx: SCD Code Status: Full code Family Communication:    Yes, patient's wife at bed side Disposition Plan:  Anticipate discharge back to previous home environment Consults called:  none Admission status: Inpatient/tele      Consultants:   ID  Ortho  Nephro  Procedures:    Per ortho--  Antimicrobials:  Anti-infectives (From admission, onward)   Start     Dose/Rate Route Frequency Ordered Stop   05/12/18 1200  vancomycin (VANCOCIN) IVPB 1000 mg/200 mL premix     1,000 mg 200 mL/hr over 60 Minutes Intravenous Every M-W-F (Hemodialysis) 05/12/18 0805     05/10/18 2359  metroNIDAZOLE (FLAGYL) tablet 500 mg  Status:  Discontinued     500 mg Oral Every 8 hours 05/10/18 2246 05/12/18 0803   05/10/18 1900  vancomycin (VANCOCIN) 2,000 mg in sodium chloride 0.9 % 500 mL IVPB     2,000 mg 250 mL/hr over 120 Minutes Intravenous  Once 05/10/18 1834 05/10/18 2112   05/10/18 1900  ceFAZolin (ANCEF) IVPB 1 g/50 mL premix  Status:  Discontinued     1 g 100 mL/hr over 30 Minutes Intravenous Every 24 hours 05/10/18 1834 05/12/18 0803   05/10/18 1815  aztreonam (AZACTAM) 2 g in sodium chloride 0.9 % 100 mL IVPB  Status:  Discontinued     2 g 200 mL/hr over 30 Minutes Intravenous  Once 05/10/18 1809 05/10/18 1825   05/10/18 1815  metroNIDAZOLE (FLAGYL) IVPB 500 mg  Status:  Discontinued     500 mg 100 mL/hr over 60 Minutes Intravenous  Once 05/10/18 1809 05/10/18 1825   05/10/18 1815  vancomycin (VANCOCIN) IVPB 1000 mg/200 mL premix  Status:  Discontinued     1,000 mg 200 mL/hr over 60 Minutes Intravenous  Once 05/10/18 1809 05/10/18 1829      Objective: Intake/Output Summary (Last 24 hours) at 05/12/2018 1513 Last data filed at 05/12/2018 1230 Gross per 24 hour  Intake 1403.33 ml  Output 2601 ml  Net -1197.67 ml   Filed Weights   05/10/18 1742 05/12/18 0715 05/12/18 1206  Weight: 95.3  kg (210 lb) 96.3 kg (212 lb 4.9 oz) (P) 93.7 kg (206 lb 9.1 oz)   BP (P) 130/62 (BP Location: Right Arm)   Pulse (P) 88   Temp (P) 98.3 F (36.8 C) (Oral)   Resp (!) (P) 22   Ht _0  (1.854 m)   Wt (P) 93.7 kg (206 lb 9.1 oz) Comment: stood to scale   SpO2 (P) 98%   BMI (P) 27.25 kg/m    Physical Exam  Constitution:  Alert, cooperative, no distress,  Psychiatric: Normal and stable mood and affect, cognition intact,   HEENT: Normocephalic, PERRL, otherwise with in Normal limits  Cardio vascular:  S1/S2, RRR, No murmure, No Rubs or Gallops  Chest/pulmonary: Clear to auscultation bilaterally, respirations unlabored  Chest symmetric Abdomen: Soft, non-tender, non-distended, bowel sounds,no masses, no organomegaly Muscular skeletal: Limited exam - in bed, able to move  all 4 extremities, Normal strength,  Neuro: CNII-XII intact. , normal motor and sensation, reflexes intact  Extremities: No changes - Symmetric 5 x 5 power.  Bilateral feet, toes amputated with except external fourth and fifth on the lef.  Right third, fourth and fifth is missing. Skin: No changes - No rashes, bilateral acute on chronic foot ulcers with previous toes amputated,.  Left lateral side and sole of the foot swelling, fluctuant, minimal discharge, 2 small ulcers on the bottom of the right foot, dry no drainage.   Data Reviewed: I have personally reviewed following labs and imaging studies  CBC: Recent Labs  Lab 05/10/18 1834 05/11/18 0616 05/12/18 0424  WBC 7.3 7.0 7.5  NEUTROABS 4.6  --   --   HGB 11.9* 11.8* 11.2*  HCT 37.5* 37.6* 35.3*  MCV 95.7 97.2 96.7  PLT 102* 187 158   Basic Metabolic Panel: Recent Labs  Lab 05/10/18 1834 05/11/18 0616 05/12/18 0424  NA 140 137 139  K 4.2 4.0 4.3  CL 101 95* 98*  CO2 _0 GLUCOSE 189* 178* 135*  BUN 14 21* 33*  CREATININE 5.03* 7.67* 10.19*  CALCIUM 7.3* 7.8* 7.8*   GFR: Estimated Creatinine Clearance: 8.7 mL/min (A) (by C-G formula based on  SCr of 10.19 mg/dL (H)). Liver Function Tests: Recent Labs  Lab 05/10/18 1834  AST 23  ALT 9*  ALKPHOS 70  BILITOT 1.2  PROT 6.6  ALBUMIN 2.7*    Recent Labs  Lab 05/10/18 2332  INR 1.15   Cardiac Enzymes: No results for input(s): CKTOTAL, CKMB, CKMBINDEX, TROPONINI in the last 168 hours. BNP (last 3 results) No results for input(s): PROBNP in the last 8760 hours. HbA1C: No results for input(s): HGBA1C in the last 72 hours. CBG: Recent Labs  Lab 05/11/18 1605 05/11/18 2131 05/12/18 0613 05/12/18 1221 05/12/18 1331  GLUCAP 180* 140* 118* 113* 161*   LSepsis Labs: Recent Labs  Lab 05/10/18 1841 05/10/18 2035 05/10/18 2332  PROCALCITON  --   --  0.50  LATICACIDVEN 1.20 1.59 1.4    Recent Results (from the past 240 hour(s))  Aerobic Culture (superficial specimen)     Status: None   Collection Time: 05/09/18  1:05 PM  Result Value Ref Range Status   Specimen Description   Final    FOOT LEFT Performed at [facility], [place] [address] [place], [place] [address]    Special Requests   Final    NONE Performed at [facility], [place] [address], [place], [place] [address]    Gram Stain   Final    RARE WBC PRESENT, PREDOMINANTLY PMN FEW GRAM POSITIVE COCCI Performed at [facility], [place] [address], [place], [place] [address]    Culture FEW METHICILLIN RESISTANT STAPHYLOCOCCUS AUREUS  Final   Report Status 05/12/2018 FINAL  Final   Organism ID, Bacteria METHICILLIN RESISTANT STAPHYLOCOCCUS AUREUS  Final      Susceptibility   Methicillin resistant staphylococcus aureus - MIC*    CIPROFLOXACIN >=8 RESISTANT Resistant     ERYTHROMYCIN >=8 RESISTANT Resistant     GENTAMICIN <=0.5 SENSITIVE Sensitive     OXACILLIN >=4 RESISTANT Resistant     TETRACYCLINE <=1 SENSITIVE Sensitive     VANCOMYCIN <=0.5 SENSITIVE Sensitive     TRIMETH/SULFA >=320 RESISTANT Resistant     CLINDAMYCIN <=0.25 SENSITIVE Sensitive     RIFAMPIN  <=0.5 SENSITIVE Sensitive     Inducible Clindamycin NEGATIVE Sensitive     *  FEW METHICILLIN RESISTANT STAPHYLOCOCCUS AUREUS  Blood Culture (routine x 2)     Status: None (Preliminary result)   Collection Time: 05/10/18  6:36 PM  Result Value Ref Range Status   Specimen Description BLOOD SITE NOT SPECIFIED  Final   Special Requests   Final    BOTTLES DRAWN AEROBIC ONLY Blood Culture adequate volume   Culture PENDING  Incomplete   Report Status PENDING  Incomplete  Blood Culture (routine x 2)     Status: None (Preliminary result)   Collection Time: 05/10/18  6:42 PM  Result Value Ref Range Status   Specimen Description BLOOD SITE NOT SPECIFIED  Final   Special Requests   Final    BOTTLES DRAWN AEROBIC ONLY Blood Culture adequate volume   Culture   Final    NO GROWTH 2 DAYS Performed at [facility], [address], [place], [place] [address]    Report Status PENDING  Incomplete      Radiology Studies: Dg Chest Port 1 View  Result Date: 05/10/2018 CLINICAL DATA:  Sepsis EXAM: PORTABLE CHEST 1 VIEW COMPARISON:  01/27/2018 FINDINGS: The heart size and mediastinal contours are within normal limits. Both lungs are clear. Mild aortic atherosclerosis. The visualized skeletal structures are unremarkable. IMPRESSION: No active disease. Electronically Signed   By: Donavan Foil M.D.   On: 05/10/2018 19:20   Dg Foot Complete Left  Result Date: 05/10/2018 CLINICAL DATA:  Foot wounds.  Evaluate for osteomyelitis. EXAM: LEFT FOOT - COMPLETE 3+ VIEW COMPARISON:  Foot MRI 01/04/2018 FINDINGS: Status post fifth ray resection. Prior fourth digit amputation. Partial amputations of the second and third digits. There is advanced erosive changes at the base of the fourth metatarsal. Irregularity is seen along the more medial Lisfranc joint with cystic type space at the base of the second metatarsal. Prominent erosion of the cuboid on the oblique view. Lateral midfoot ulcers. No tracking soft tissue  gas. Osteopenia. IMPRESSION: Known midfoot osteomyelitis, reference foot MRI 01/04/2018. There is associated prominent erosion of the fourth metatarsal base and cuboid. Electronically Signed   By: Monte Fantasia M.D.   On: 05/10/2018 21:48    Scheduled Meds: . atorvastatin  20 mg Oral QHS  . calcitRIOL  3 mcg Intravenous Q M,W,F-HD  . clopidogrel  75 mg Oral Q breakfast  . ferric citrate  420 mg Oral TID WC  . heparin  5,000 Units Subcutaneous Q8H  . insulin aspart  0-5 Units Subcutaneous QHS  . insulin aspart  0-9 Units Subcutaneous TID WC  . multivitamin with minerals  1 tablet Oral Daily  . primidone  50 mg Oral QHS  . sodium chloride flush  3 mL Intravenous Q12H  . timolol  1 drop Left Eye BID  . vitamin C  500 mg Oral Once per day on Mon Thu   Continuous Infusions: . sodium chloride 50 mL/hr at 05/11/18 0402  . sodium chloride    . vancomycin 1,000 mg (05/12/18 1105)    Time spent: >25 minutes  Deatra James, MD [facility],  Pager [phone]  If 7PM-7AM, please contact night-coverage [email or web address]   Password [facility]  05/12/2018, 3:13 PM

## 2018-05-12 NOTE — Progress Notes (Signed)
   Daily Progress Note   Assessment/Planning:   POD #1 s/p DES TPT, PTA PT   No complications from endovascular procedure  Wound care per Ortho  Dr. Scot Dock will come by today to check on patient   Subjective  - 1 Day Post-Op   No events overnight, no complaints   Objective   Vitals:   05/11/18 1520 05/11/18 1555 05/11/18 2134 05/12/18 0611  BP: (!) 143/54  134/73 113/62  Pulse: 76 81 92 81  Resp: 14 18 (!) 24 18  Temp:  98.4 F (36.9 C) 99.7 F (37.6 C) 98.9 F (37.2 C)  TempSrc:  Oral Oral Oral  SpO2: 99% 98% 100% 96%  Weight:      Height:         Intake/Output Summary (Last 24 hours) at 05/12/2018 0714 Last data filed at 05/11/2018 2248 Gross per 24 hour  Intake 1163.33 ml  Output 101 ml  Net 1062.33 ml    VASC R groin: no hematoma, no echymosis, no bruit    Laboratory   CBC CBC Latest Ref Rng & Units 05/12/2018 05/11/2018 05/10/2018  WBC 4.0 - 10.5 K/uL 7.5 7.0 7.3  Hemoglobin 13.0 - 17.0 g/dL 11.2(L) 11.8(L) 11.9(L)  Hematocrit 39.0 - 52.0 % 35.3(L) 37.6(L) 37.5(L)  Platelets 150 - 400 K/uL 185 187 102(L)    BMET    Component Value Date/Time   NA 139 05/12/2018 0424   K 4.3 05/12/2018 0424   CL 98 (L) 05/12/2018 0424   CO2 27 05/12/2018 0424   GLUCOSE 135 (H) 05/12/2018 0424   BUN 33 (H) 05/12/2018 0424   CREATININE 10.19 (H) 05/12/2018 0424   CALCIUM 7.8 (L) 05/12/2018 0424   GFRNONAA 5 (L) 05/12/2018 0424   GFRAA 5 (L) 05/12/2018 0424     Adele Barthel, MD, FACS [facility] Office: [phone] Pager: [phone]  05/12/2018, 7:14 AM

## 2018-05-12 NOTE — Consult Note (Signed)
Renal Service Consult Note [facility]  [name] Sr. 05/12/2018 [place] Requesting Physician:  Dr Roger Shelter  Reason for Consult:  ESRD pt with  HPI: The patient is a 67 y.o. year-old with hist of esrd on hd diabetes on insulin hypertension pad hist osteomyelitis with toe amps glaucoma and cardiomyopathy presented with painful ulcers with drainage on the left foot.  He had been getting iv abx and vasc surgery took him yesterday for angioplasty and stenting of the left leg below the knee.  Seen by infect disease also. Getting iv vancomycin and cx's + for mrsa.  Asked to see for dialysis.   Patient w/o complaints today denies any sob chest pain abd pain nausea fever or vomiting.    ROS  denies CP  no joint pain   no HA  no blurry vision  no rash  no diarrhea  no nausea/ vomiting    Past Medical History  Past Medical History:  Diagnosis Date  . Cardiomyopathy ([place])   . Cataract    right eye  . Diabetes mellitus    type 2  . Diabetic foot ulcer ([place])   . Diabetic infection of right foot ([place])   . Diabetic peripheral neuropathy ([place])   . ESRD (end stage renal disease) ([place])    m-w-f [facility] [address]  . Glaucoma    left eye  . Headache   . HTN (hypertension)   . Osteomyelitis ([place])   . Peripheral vascular disease ([place])    critical limb ischemia   Past Surgical History  Past Surgical History:  Procedure Laterality Date  . ABDOMINAL AORTOGRAM N/A 05/04/2018   Procedure: ABDOMINAL AORTOGRAM;  Surgeon: Conrad Altamont, MD;  Location: [place] [facility];  Service: Cardiovascular;  Laterality: N/A;  . AMPUTATION TOE Right 01/21/2017   Procedure: AMPUTATION 2ND TOE RIGHT FOOT ;  Surgeon: Dorna Leitz, MD;  Location: [place];  Service: Orthopedics;  Laterality: Right;  . AMPUTATION TOE Left 05/18/2017   Procedure: 5TH RAY AMPUTATION  and EXCISION CUBOID;  Surgeon: Dorna Leitz, MD;  Location: [place];  Service: Orthopedics;  Laterality:  Left;  . AMPUTATION TOE Bilateral 01/27/2018   Procedure: RIGHT FOOT 3RD TOE AMPUTATION WITH INCISIONAL WOUND DEBRIDEMENT. LEFT FOOT 2ND TOE AMPUTATION AND INCISIONAL WOUND DEBRIDEMENT.;  Surgeon: Dorna Leitz, MD;  Location: [facility];  Service: Orthopedics;  Laterality: Bilateral;  . AV FISTULA PLACEMENT Left 11/27/2014   Procedure: ARTERIOVENOUS (AV) FISTULA CREATION;  Surgeon: Conrad Bent, MD;  Location: [place];  Service: Vascular;  Laterality: Left;  . CHOLECYSTECTOMY    . COLONOSCOPY N/A 11/28/2014   Procedure: COLONOSCOPY;  Surgeon: Ladene Artist, MD;  Location: [facility];  Service: Endoscopy;  Laterality: N/A;  . ESOPHAGOGASTRODUODENOSCOPY N/A 11/28/2014   Procedure: ESOPHAGOGASTRODUODENOSCOPY (EGD);  Surgeon: Ladene Artist, MD;  Location: [facility] ENDOSCOPY;  Service: Endoscopy;  Laterality: N/A;  . EYE SURGERY     retina and cataract left eye surgery  . I&D EXTREMITY  01/28/2012   Procedure: IRRIGATION AND DEBRIDEMENT EXTREMITY;  Surgeon: Paulene Floor, MD;  Location: [facility];  Service: Orthopedics;  Laterality: Left;  irrigation and drainage left hand  . I&D EXTREMITY  01/30/2012   Procedure: IRRIGATION AND DEBRIDEMENT EXTREMITY;  Surgeon: Paulene Floor, MD;  Location: [facility];  Service: Orthopedics;  Laterality: Left;  flexor teno synovectomy and extensor tenosynovectomy arthrosynovectomy wristjoint  . I&D EXTREMITY  02/01/2012   Procedure: IRRIGATION AND DEBRIDEMENT EXTREMITY;  Surgeon: Paulene Floor, MD;  [facility];  Service: Orthopedics;  Laterality: Left;  Left Wrist, Hand and Forearm  . IRRIGATION AND DEBRIDEMENT FOOT Left 01/21/2017   Procedure: DEBRIDEMENT LEFT PLANTAR WOUND;  Surgeon: Dorna Leitz, MD;  Location: [place];  Service: Orthopedics;  Laterality: Left;  . Left 4th toe amputation    . LEFT AND RIGHT HEART CATHETERIZATION WITH CORONARY ANGIOGRAM N/A 11/26/2014   Procedure: LEFT AND RIGHT HEART CATHETERIZATION WITH CORONARY ANGIOGRAM;  Surgeon: Leonie Man, MD;  Location: [facility] LAB;  Service: Cardiovascular;  Laterality: N/A;  . LOWER EXTREMITY ANGIOGRAPHY Bilateral 05/04/2018   Procedure: Lower Extremity Angiography;  Surgeon: Conrad [employer], MD;  Location: [place] [facility];  Service: Cardiovascular;  Laterality: Bilateral;  . LOWER EXTREMITY ANGIOGRAPHY Left 05/11/2018   Procedure: LOWER EXTREMITY ANGIOGRAPHY;  Surgeon: Conrad Roberts, MD;  Location: [place] [facility];  Service: Cardiovascular;  Laterality: Left;  . PERIPHERAL VASCULAR BALLOON ANGIOPLASTY Left 05/11/2018   Procedure: PERIPHERAL VASCULAR BALLOON ANGIOPLASTY;  Surgeon: Conrad Winnett, MD;  Location: [place] [facility];  Service: Cardiovascular;  Laterality: Left;  Posterior tibial  . PERIPHERAL VASCULAR INTERVENTION Left 05/11/2018   Procedure: PERIPHERAL VASCULAR INTERVENTION;  Surgeon: Conrad , MD;  Location: [place] [facility];  Service: Cardiovascular;  Laterality: Left;  tibioperoneal trunk  . TONSILLECTOMY     Family History  Family History  Problem Relation Age of Onset  . Heart disease Mother   . Diabetes Father   . Diabetes Brother   . Diabetes Sister   . Diabetes Brother   . Diabetes Brother    Social History  reports that he has quit smoking. His smoking use included cigarettes. He has a 2.00 pack-year smoking history. He has never used smokeless tobacco. He reports that he does not drink alcohol or use drugs. Allergies  Allergies  Allergen Reactions  . Penicillins Hives, Swelling and Other (See Comments)    Ankle swelling  PATIENT HAD A PCN REACTION WITH IMMEDIATE RASH, FACIAL/TONGUE/THROAT SWELLING, SOB, OR LIGHTHEADEDNESS WITH HYPOTENSION:  #  #  #  YES  #  #  #   Has patient had a PCN reaction causing severe rash involving mucus membranes or skin necrosis: no Has patient had a PCN reaction that required hospitalization: no Has patient had a PCN reaction occurring within the last 10 years: no If all of the above answers are "NO", then may  proceed with Cephalosporin use.   Home medications Prior to Admission medications   Medication Sig Start Date End Date Taking? Authorizing Provider  acetaminophen (TYLENOL) 325 MG tablet Take 650 mg by mouth every 6 (six) hours as needed (pain).    Yes [provider]  atorvastatin (LIPITOR) 20 MG tablet Take 20 mg by mouth at bedtime.    Yes [provider]  ferric citrate (AURYXIA) 1 GM 210 MG(Fe) tablet Take 420 mg by mouth 3 (three) times daily with meals.    Yes [provider]  ibuprofen (ADVIL,MOTRIN) 200 MG tablet Take 400 mg by mouth as needed for moderate pain.   Yes [provider]  insulin NPH-regular Human (NOVOLIN 70/30) (70-30) 100 UNIT/ML injection Inject 1-5 Units into the skin 2 (two) times daily with a meal. Sliding Scale   Yes [provider]  Multiple Vitamins-Minerals (MULTIVITAMIN WITH MINERALS) tablet Take 1 tablet by mouth daily.   Yes [provider]  mupirocin ointment (BACTROBAN) 2 % Apply 1 application  topically daily as needed (rash on stomach).  08/05/16  Yes [provider]  primidone (MYSOLINE) 50 MG tablet Take 0.5 tablets (25 mg total) by mouth at bedtime. Patient taking differently: Take 50 mg by mouth at bedtime.  05/25/17  Yes Patrecia Pour, MD  timolol (TIMOPTIC) 0.5 % ophthalmic solution Place 1 drop into the left eye 2 (two) times daily.   Yes [provider]  vitamin C (ASCORBIC ACID) 500 MG tablet Take 500 mg by mouth 2 (two) times a week.   Yes [provider]  glucose monitoring kit (FREESTYLE) monitoring kit 1 each by Does not apply route 4 (four) times daily - after meals and at bedtime. 1 month Diabetic Testing Supplies for QAC-QHS accuchecks.Any brand OK Patient not taking: Reported on 05/01/2018 11/29/14   Thurnell Lose, MD  HYDROcodone-acetaminophen (NORCO/VICODIN) 5-325 MG tablet Take 1 tablet by mouth every 6 (six) hours as needed for moderate pain. Patient not  taking: Reported on 05/10/2018 01/27/18   Gary Fleet, PA-C  Insulin Syringe-Needle U-100 25G X 1" 1 ML MISC Any brand, for 4 times a day insulin SQ, 1 month supply. Patient not taking: Reported on 05/01/2018 11/29/14   Thurnell Lose, MD   Liver Function Tests Recent Labs  Lab 05/10/18 1834  AST 23  ALT 9*  ALKPHOS 70  BILITOT 1.2  PROT 6.6  ALBUMIN 2.7*   No results for input(s): LIPASE, AMYLASE in the last 168 hours. CBC Recent Labs  Lab 05/10/18 1834 05/11/18 0616 05/12/18 0424  WBC 7.3 7.0 7.5  NEUTROABS 4.6  --   --   HGB 11.9* 11.8* 11.2*  HCT 37.5* 37.6* 35.3*  MCV 95.7 97.2 96.7  PLT 102* 187 048   Basic Metabolic Panel Recent Labs  Lab 05/10/18 1834 05/11/18 0616 05/12/18 0424  NA 140 137 139  K 4.2 4.0 4.3  CL 101 95* 98*  CO2 _0 GLUCOSE 189* 178* 135*  BUN 14 21* 33*  CREATININE 5.03* 7.67* 10.19*  CALCIUM 7.3* 7.8* 7.8*   Iron/TIBC/Ferritin/ %Sat    Component Value Date/Time   IRON 16 (L) 11/22/2014 1704   TIBC 253 11/22/2014 1704   FERRITIN 45 11/22/2014 1704   IRONPCTSAT 6 (L) 11/22/2014 1704    Vitals:   05/12/18 1030 05/12/18 1100 05/12/18 1130 05/12/18 1206  BP: 114/65 (!) (P) 108/57 (P) 130/62   Pulse: 89 (P) 87 (P) 88   Resp:   (!) (P) 22   Temp:   (P) 98.3 F (36.8 C)   TempSrc:   (P) Oral   SpO2:   (P) 98%   Weight:    (P) 93.7 kg (206 lb 9.1 oz)  Height:       Exam Gen alert, on hd no distress No rash, cyanosis or gangrene Sclera anicteric, throat clear  No jvd or bruits Chest clear bilat RRR no rub or gallop Abd soft ntnd no mass or ascites +bs GU normal male MS no joint effusions or deformity Ext both feet bandaged, no sig LE edema Neuro is alert, Ox 3 , nf LUA AVF+bruit    home meds:  - novolin 70/30 bid per ssi  - lipitor/ auryxia 420 tid ac/ prn advil/ mvi/ norco every 6h prn/ vitamins/ prns  - primidone 50 mg qhs daily     Dialysis: mwf   4h 78mn   500/800  94kg  2/2.25 bath  Heparin 3800 vs  6000 (both in system)  LUA AVF  -  hectorol 3ug tiw  - parsabiv 7.28m tiw iv w hd     Impression: 1  Infected diabetic foot ulcers - of left foot getting iv vanc per ID and has had pta/ stenting of artery on the left leg below the knee 2  esrd on hd mwf 3  Volume is stable at dry wt no excess on exam 4  diab mellitus 2 - on 70/30 insulin at home 5  Hypertension - bp's low to normal not on any bp meds at home 6  Anemia ckd - hb 11's no esa needed now   Plan - dialysis this am underway  RKelly SplinterMD CNewell Rubbermaidpager [phone]  05/12/2018, 12:25 PM

## 2018-05-12 NOTE — Progress Notes (Signed)
Pharmacy Antibiotic Note  [name]. is a 67 y.o. male presenting with a diabetic foot infection. He was seen outpatient by his PCP yesterday and prescribed antibiotics, but never started them. A culture from this visit is now growing MRSA. Pharmacy is on board for Vancomycin dosing.    The patient is ESRD-MWF and is on schedule and receiving HD this AM - will schedule maintenance doses accordingly.   Plan: Vancomycin 1g IV post HD-MWF Will continue to follow HD schedule/duration, culture results, LOT, and antibiotic de-escalation plans   Height: 6\' 1"  (185.4 cm) Weight: 210 lb (95.3 kg) IBW/kg (Calculated) : 79.9  Temp (24hrs), Avg:99 F (37.2 C), Min:98.4 F (36.9 C), Max:99.7 F (37.6 C)  Recent Labs  Lab 05/10/18 1834 05/10/18 1841 05/10/18 2035 05/10/18 2332 05/11/18 0616 05/12/18 0424  WBC 7.3  --   --   --  7.0 7.5  CREATININE 5.03*  --   --   --  7.67* 10.19*  LATICACIDVEN  --  1.20 1.59 1.4  --   --     Estimated Creatinine Clearance: 8.1 mL/min (A) (by C-G formula based on SCr of 10.19 mg/dL (H)).    Allergies  Allergen Reactions  . Penicillins Hives, Swelling and Other (See Comments)    Ankle swelling  PATIENT HAD A PCN REACTION WITH IMMEDIATE RASH, FACIAL/TONGUE/THROAT SWELLING, SOB, OR LIGHTHEADEDNESS WITH HYPOTENSION:  #  #  #  YES  #  #  #   Has patient had a PCN reaction causing severe rash involving mucus membranes or skin necrosis: no Has patient had a PCN reaction that required hospitalization: no Has patient had a PCN reaction occurring within the last 10 years: no If all of the above answers are "NO", then may proceed with Cephalosporin use.   Antimicrobials this admission: Vanc 5/15 >> Cefazolin 5/15 >> 5/17 Flagyl 5/16 >> 5/17  Dose adjustments this admission: None  Microbiology results: 5/14 wound cx: S. Aureus 5/15 BCx: ngtd  Thank you for allowing pharmacy to be a part of this patient's care.  Alycia Rossetti, PharmD,  BCPS Clinical Pharmacist Pager: [phone] Clinical phone for 05/12/2018 from 7a-3:30p: [phone] If after 3:30p, please call main pharmacy at: [phone] 05/12/2018 8:15 AM

## 2018-05-13 DIAGNOSIS — L089 Local infection of the skin and subcutaneous tissue, unspecified: Secondary | ICD-10-CM

## 2018-05-13 DIAGNOSIS — E11628 Type 2 diabetes mellitus with other skin complications: Secondary | ICD-10-CM

## 2018-05-13 LAB — GLUCOSE, CAPILLARY
GLUCOSE-CAPILLARY: 136 mg/dL — AB (ref 65–99)
Glucose-Capillary: 157 mg/dL — ABNORMAL HIGH (ref 65–99)
Glucose-Capillary: 168 mg/dL — ABNORMAL HIGH (ref 65–99)

## 2018-05-13 MED ORDER — DOXERCALCIFEROL 4 MCG/2ML IV SOLN: 3.0000 ug | INTRAVENOUS | Status: DC

## 2018-05-13 MED ORDER — POLYETHYLENE GLYCOL 3350 17 GM/SCOOP PO POWD
1.0000 | Freq: Once | ORAL | Status: AC
  Administered 2018-05-13: 255 g via ORAL
  Filled 2018-05-13: qty 255

## 2018-05-13 NOTE — Progress Notes (Addendum)
[facility] Progress Note   Dialysis Orders: mwf   4h 54min   500/800  94kg  2/2.25 bath  Heparin 3800 vs 6000 (both in system)  LUA AVF  - hectorol 3ug tiw  - parsabiv 7.5mg  tiw iv w hd  Assessment/Plan: 1. Infected diabetic food ulcers -  s/p PTA/stent of LLE artery - IV Vanc MRI 5/17 osteo 5th toe, no right foot osteo BC pending - no growth so far - we can continue Vanc at dialysis after d/c 2. ESRD -MWF - next HD Monday 3. Anemia - hgb 11.2 -no on ESA yet 4. Secondary hyperparathyroidism - parsabiv not avail here - continue hectorol 5. HTN/volume - net UF 2.5 post wt 93.7 5/17 - no BP meds 6. Nutrition -renal carb mod/vits 7. DM-  Per primary  [name], PA-C [facility] Beeper [phone] 05/13/2018,9:49 AM  LOS: 3 days   Pt seen, examined and agree w A/P as above.  Kelly Splinter MD [facility] pager [phone]   05/13/2018, 12:32 PM    Subjective:   No c/o  Objective Vitals:   05/12/18 1206 05/12/18 1813 05/12/18 2109 05/13/18 0619  BP:  115/61 121/67 110/64  Pulse:  76 73 73  Resp:   17 13  Temp:  99 F (37.2 C) 99 F (37.2 C)   TempSrc:  Oral Oral   SpO2:  100% 95% 95%  Weight: 93.7 kg (206 lb 9.1 oz)     Height:       Physical Exam General: thin male - NAD Heart: RRR Lungs: no rales Abdomen: soft NT Extremities: no LE edema, bilateral feet wrapped Dialysis Access: left AVF + bruit   Additional Objective Labs: Basic Metabolic Panel: Recent Labs  Lab 05/10/18 1834 05/11/18 0616 05/12/18 0424  NA 140 137 139  K 4.2 4.0 4.3  CL 101 95* 98*  CO2 25 28 27   GLUCOSE 189* 178* 135*  BUN 14 21* 33*  CREATININE 5.03* 7.67* 10.19*  CALCIUM 7.3* 7.8* 7.8*   Liver Function Tests: Recent Labs  Lab 05/10/18 1834  AST 23  ALT 9*  ALKPHOS 70  BILITOT 1.2  PROT 6.6  ALBUMIN 2.7*   No results for input(s): LIPASE, AMYLASE in the last 168 hours. CBC: Recent Labs  Lab 05/10/18 1834  05/11/18 0616 05/12/18 0424  WBC 7.3 7.0 7.5  NEUTROABS 4.6  --   --   HGB 11.9* 11.8* 11.2*  HCT 37.5* 37.6* 35.3*  MCV 95.7 97.2 96.7  PLT 102* 187 185   Blood Culture    Component Value Date/Time   SDES BLOOD SITE NOT SPECIFIED 05/10/2018 1842   SPECREQUEST  05/10/2018 1842    BOTTLES DRAWN AEROBIC ONLY Blood Culture adequate volume   CULT  05/10/2018 1842    NO GROWTH 2 DAYS Performed at [facility], [place] [address], [place], [place] [address]    REPTSTATUS PENDING 05/10/2018 1842    Cardiac Enzymes: No results for input(s): CKTOTAL, CKMB, CKMBINDEX, TROPONINI in the last 168 hours. CBG: Recent Labs  Lab 05/12/18 1221 05/12/18 1331 05/12/18 1713 05/12/18 2108 05/13/18 0617  GLUCAP 113* 161* 169* 143* 157*   Iron Studies: No results for input(s): IRON, TIBC, TRANSFERRIN, FERRITIN in the last 72 hours. Lab Results  Component Value Date   INR 1.15 05/10/2018   INR 1.19 05/17/2017   INR 1.33 11/27/2014   Studies/Results: Mr Foot Right Wo Contrast  Result Date: 05/12/2018 CLINICAL DATA:  Soft tissue  ulcerations of the plantar aspect of the right foot EXAM: MRI OF THE RIGHT FOREFOOT WITHOUT CONTRAST TECHNIQUE: Multiplanar, multisequence MR imaging of the right forefoot was performed. No intravenous contrast was administered. COMPARISON:  MRI dated 04/20/2018 and radiographs dated 01/12/2018 FINDINGS: Bones/Joint/Cartilage Multiple prior amputations including the second, fourth and fifth toes and distal third toe and the distal fifth metatarsal. Osteoarthritis of the first MTP joint. No joint effusions.  No visible osteomyelitis. Soft tissues Superficial soft tissue ulceration at the plantar aspect of the ball of the foot at the level of the head of second metatarsal, slightly larger than on the prior study with increased edema in the subcutaneous fat adjacent to the ulcer consistent with focal cellulitis. No underlying abscess or osteomyelitis. IMPRESSION: 1. Slight  progression of plantar soft tissue ulceration on the ball of the foot adjacent to the second metatarsal head with adjacent cellulitis. 2. No evidence of osteomyelitis. Electronically Signed   By: Lorriane Shire M.D.   On: 05/12/2018 17:35   Mr Foot Left Wo Contrast  Result Date: 05/12/2018 CLINICAL DATA:  Soft tissue ulcerations of the lateral aspect of the foot. Foot swelling. EXAM: MRI OF THE LEFT FOOT WITHOUT CONTRAST TECHNIQUE: Multiplanar, multisequence MR imaging of the left foot was performed. No intravenous contrast was administered. COMPARISON:  Radiographs dated 05/10/2018 and MRIs dated 01/04/2018 and 05/15/2017 FINDINGS: Bones/Joint/Cartilage There is destruction abnormal edema in the base of the fifth metatarsal as well as in the distal and lateral aspects of the cuboid. There are new bony erosions involving the plantar aspect of the base of the third metatarsal as well as at the second TMT joint and at articulation of the navicular with the medial cuneiform. There joint effusions at those sites as well as at the fourth TMT joint. Soft tissues There are soft tissue ulcerations that extend to the eroded base of the fifth metatarsal to the lateral aspect of the eroded cuboid. No discrete soft tissue abscesses. IMPRESSION: 1. 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 2. Stable erosive changes of the base of the second metatarsal. 3. 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 Electronically Signed   By: Lorriane Shire M.D.   On: 05/12/2018 17:28   Medications: . sodium chloride    . vancomycin 1,000 mg (05/12/18 1105)   . atorvastatin  20 mg Oral QHS  . calcitRIOL  3 mcg Intravenous Q M,W,F-HD  . clopidogrel  75 mg Oral Q breakfast  . ferric citrate  420 mg Oral TID WC  . heparin  5,000 Units Subcutaneous Q8H  . insulin aspart  0-5 Units Subcutaneous QHS  . insulin aspart  0-9 Units Subcutaneous TID WC  . multivitamin with  minerals  1 tablet Oral Daily  . primidone  50 mg Oral QHS  . sodium chloride flush  3 mL Intravenous Q12H  . timolol  1 drop Left Eye BID  . vitamin C  500 mg Oral Once per day on Mon Thu

## 2018-05-13 NOTE — Progress Notes (Addendum)
PROGRESS NOTE    [name] NICHOLL Sr.  YKZ:[id number] DOB: [birth year] DOA: 05/10/2018 PCP: [name], MD   Brief Narrative: [name] Sages Kempis a 66 y.o.malewith medical history significant ofhypertension, hyperlipidemia, diabetes mellitus, ESRD-HD (MWF), PVD,s/p of amputation of multiple toes,anemia, who presents with left foot infection and fever and chills. He has a chronic foot ulcer/wound, currently one ulcer in the lateral side of left foot, 2 small ulcer in the right foot bottom.  Pt stated that Dr. Bridgett Larsson of [facility] was planning to do angiogram for his legs due to PAD . Patient had hypotension with blood pressure 81/54. He was given 500 cc of NS indialysiscenter and250 cc normal saline inED, his blood pressure improved to 162/83. In ED he  was found to haveWBC 7.3, lactic acid 1.20, 1.59, potassium 4.2, bicarbonate 25, creatinine 5.03, BUN 14, temperature 101.3, heart rate in 90s, tachypnea, oxygen satting 90 to 97% on room air, negative chest x-ray. X-ray of left foot showed no osteomyelitis. Orthopedics, infectious disease, vascular surgery, nephrology consulted after admission.  MRI of the left foot shows progressive osteomyelitis and bone destruction of left cuboid and fifth metatarsal.  ID recommended to continue IV vancomycin post hemodialysis for total of 6 weeks.  He was also seen by orthopedics during this admission and recommended to follow-up as an outpatient as there is no plan for further intervention.  He was also evaluated by vascular surgery and he underwent angioplasty and stenting of left tibial peroneal trunk and angioplasty of the left posterior tibial artery.  He will follow-up with vascular surgery as an outpatient in 3 months.  Patient also underwent dialysis as per nephrology during his hospitalization.   Assessment & Plan:   Principal Problem:   Diabetic foot infection ([place]) Active Problems:   Anemia due to end stage renal disease (HCC)   Benign  essential HTN   Peripheral arterial disease (HCC)   Osteomyelitis of left foot (HCC)   ESRD on hemodialysis (HCC)   Type II diabetes mellitus with renal manifestations (HCC)   Sepsis ([place])   HLD (hyperlipidemia)   Sepsis due to diabetic foot infection, osteomyelitis of left foot: -On admission patient met admission criteria of sepsis with fever, tachycardia, tachypnea, hypotensive. -On admission T-max 102.9, WBC 7, lactic acid 1.4, procalcitonin 0.5 -Blood and wound cultures, negative to date -Will continue vancomycin for total of 6 weeks PHD.  He will follow-up with ID as an outpatient -Record indicates patient had ended up prolonged IV antibiotics on April 11 -Infectious disease team Dr. Baxter Flattery was following -On-call orthopedic team following recommending no intervention at this time. - MRI of lower extremities as above  Anemia due to end stage renal disease [facility]): Hemoglobin stable, 11.9 on admission  Benign essential HTN: -Patient was mildly hypotensive, today his blood pressure is stable -Patient is not on any home medication for blood pressure  -IV hydralazine as needed  Peripheral arterial disease ([place]): -S/p:Angioplasty and stenting of left tibioperoneal trunk  and angioplasty of the left posterior tibial artery. -Vascular surgery was following -Added Plavix -He would follow-up with vascular surgery as an outpatient.  ESRD on hemodialysis (MWF): -Undergoing dialysis as per nephrology  Type II diabetes mellitus with renal manifestationsand PVD: -Last A1c6.7 on 5/21,well controled. Patient is taking70/30 insulin and SSIat home -SSI to be continued here  HLD (hyperlipidemia); -Continue lipitor  Will request for physical therapy evaluation today.  Plan is to discharge to home likely with home health tomorrow.   DVT prophylaxis: SCD  Code Status: Full Family Communication: None present at bedside Disposition Plan: Likely home  tomorrow   Consultants: Vascular surgery, orthopedics, ID, nephrology  Procedures: Angioplasty and stenting  Antimicrobials: Vancomycin since 5/15  Subjective: Patient seen and examined the bedside this morning.  Remains comfortable.  Denies any complaints at present.  His blood pressure stable today.Willing  to go home. Objective: Vitals:   05/12/18 1206 05/12/18 1813 05/12/18 2109 05/13/18 0619  BP:  115/61 121/67 110/64  Pulse:  76 73 73  Resp:   17 13  Temp:  99 F (37.2 C) 99 F (37.2 C)   TempSrc:  Oral Oral   SpO2:  100% 95% 95%  Weight: 93.7 kg (206 lb 9.1 oz)     Height:        Intake/Output Summary (Last 24 hours) at 05/13/2018 1059 Last data filed at 05/13/2018 0300 Gross per 24 hour  Intake 240 ml  Output 2500 ml  Net -2260 ml   Filed Weights   05/10/18 1742 05/12/18 0715 05/12/18 1206  Weight: 95.3 kg (210 lb) 96.3 kg (212 lb 4.9 oz) 93.7 kg (206 lb 9.1 oz)    Examination:  General exam: Appears calm and comfortable ,Not in distress,average built HEENT:PERRL,Oral mucosa moist, Ear/Nose normal on gross exam Respiratory system: Bilateral equal air entry, normal vesicular breath sounds, no wheezes or crackles  Cardiovascular system: S1 & S2 heard, RRR. No JVD, murmurs, rubs, gallops or clicks. No pedal edema. Gastrointestinal system: Abdomen is nondistended, soft and nontender. No organomegaly or masses felt. Normal bowel sounds heard. Central nervous system: Alert and oriented. No focal neurological deficits. Extremities: No edema, no clubbing ,no cyanosis, distal peripheral pulses palpable. Skin: No rashes, lesions or ulcers,no icterus ,no pallor MSK: Normal muscle bulk,tone ,power Psychiatry: Judgement and insight appear normal. Mood & affect appropriate.     Data Reviewed: I have personally reviewed following labs and imaging studies  CBC: Recent Labs  Lab 05/10/18 1834 05/11/18 0616 05/12/18 0424  WBC 7.3 7.0 7.5  NEUTROABS 4.6  --   --    HGB 11.9* 11.8* 11.2*  HCT 37.5* 37.6* 35.3*  MCV 95.7 97.2 96.7  PLT 102* 187 209   Basic Metabolic Panel: Recent Labs  Lab 05/10/18 1834 05/11/18 0616 05/12/18 0424  NA 140 137 139  K 4.2 4.0 4.3  CL 101 95* 98*  CO2 _0 GLUCOSE 189* 178* 135*  BUN 14 21* 33*  CREATININE 5.03* 7.67* 10.19*  CALCIUM 7.3* 7.8* 7.8*   GFR: Estimated Creatinine Clearance: 8.1 mL/min (A) (by C-G formula based on SCr of 10.19 mg/dL (H)). Liver Function Tests: Recent Labs  Lab 05/10/18 1834  AST 23  ALT 9*  ALKPHOS 70  BILITOT 1.2  PROT 6.6  ALBUMIN 2.7*   No results for input(s): LIPASE, AMYLASE in the last 168 hours. No results for input(s): AMMONIA in the last 168 hours. Coagulation Profile: Recent Labs  Lab 05/10/18 2332  INR 1.15   Cardiac Enzymes: No results for input(s): CKTOTAL, CKMB, CKMBINDEX, TROPONINI in the last 168 hours. BNP (last 3 results) No results for input(s): PROBNP in the last 8760 hours. HbA1C: No results for input(s): HGBA1C in the last 72 hours. CBG: Recent Labs  Lab 05/12/18 1221 05/12/18 1331 05/12/18 1713 05/12/18 2108 05/13/18 0617  GLUCAP 113* 161* 169* 143* 157*   Lipid Profile: No results for input(s): CHOL, HDL, LDLCALC, TRIG, CHOLHDL, LDLDIRECT in the last 72 hours. Thyroid Function Tests: No results for input(s):  TSH, T4TOTAL, FREET4, T3FREE, THYROIDAB in the last 72 hours. Anemia Panel: No results for input(s): VITAMINB12, FOLATE, FERRITIN, TIBC, IRON, RETICCTPCT in the last 72 hours. Sepsis Labs: Recent Labs  Lab 05/10/18 1841 05/10/18 2035 05/10/18 2332  PROCALCITON  --   --  0.50  LATICACIDVEN 1.20 1.59 1.4    Recent Results (from the past 240 hour(s))  Aerobic Culture (superficial specimen)     Status: None   Collection Time: 05/09/18  1:05 PM  Result Value Ref Range Status   Specimen Description   Final    FOOT LEFT Performed at [facility] [address], [place], [place] [address]     Special Requests   Final    NONE Performed at [facility], [place] [address] [place], [place] [address]    Gram Stain   Final    RARE WBC PRESENT, PREDOMINANTLY PMN FEW GRAM POSITIVE COCCI Performed at [facility], [place] [address], [place], [place] [address]    Culture FEW METHICILLIN RESISTANT STAPHYLOCOCCUS AUREUS  Final   Report Status 05/12/2018 FINAL  Final   Organism ID, Bacteria METHICILLIN RESISTANT STAPHYLOCOCCUS AUREUS  Final      Susceptibility   Methicillin resistant staphylococcus aureus - MIC*    CIPROFLOXACIN >=8 RESISTANT Resistant     ERYTHROMYCIN >=8 RESISTANT Resistant     GENTAMICIN <=0.5 SENSITIVE Sensitive     OXACILLIN >=4 RESISTANT Resistant     TETRACYCLINE <=1 SENSITIVE Sensitive     VANCOMYCIN <=0.5 SENSITIVE Sensitive     TRIMETH/SULFA >=320 RESISTANT Resistant     CLINDAMYCIN <=0.25 SENSITIVE Sensitive     RIFAMPIN <=0.5 SENSITIVE Sensitive     Inducible Clindamycin NEGATIVE Sensitive     * FEW METHICILLIN RESISTANT STAPHYLOCOCCUS AUREUS  Blood Culture (routine x 2)     Status: None (Preliminary result)   Collection Time: 05/10/18  6:36 PM  Result Value Ref Range Status   Specimen Description BLOOD SITE NOT SPECIFIED  Final   Special Requests   Final    BOTTLES DRAWN AEROBIC ONLY Blood Culture adequate volume   Culture   Final    NO GROWTH 2 DAYS Performed at [facility], [address], [place], [place] [address]    Report Status PENDING  Incomplete  Blood Culture (routine x 2)     Status: None (Preliminary result)   Collection Time: 05/10/18  6:42 PM  Result Value Ref Range Status   Specimen Description BLOOD SITE NOT SPECIFIED  Final   Special Requests   Final    BOTTLES DRAWN AEROBIC ONLY Blood Culture adequate volume   Culture   Final    NO GROWTH 2 DAYS Performed at [facility], [address], [place], [place] [address]    Report Status PENDING  Incomplete         Radiology Studies: Mr Foot  Right Wo Contrast  Result Date: 05/12/2018 CLINICAL DATA:  Soft tissue ulcerations of the plantar aspect of the right foot EXAM: MRI OF THE RIGHT FOREFOOT WITHOUT CONTRAST TECHNIQUE: Multiplanar, multisequence MR imaging of the right forefoot was performed. No intravenous contrast was administered. COMPARISON:  MRI dated 04/20/2018 and radiographs dated 01/12/2018 FINDINGS: Bones/Joint/Cartilage Multiple prior amputations including the second, fourth and fifth toes and distal third toe and the distal fifth metatarsal. Osteoarthritis of the first MTP joint. No joint effusions.  No visible osteomyelitis. Soft tissues Superficial soft tissue ulceration at the plantar aspect of the ball of  the foot at the level of the head of second metatarsal, slightly larger than on the prior study with increased edema in the subcutaneous fat adjacent to the ulcer consistent with focal cellulitis. No underlying abscess or osteomyelitis. IMPRESSION: 1. Slight progression of plantar soft tissue ulceration on the ball of the foot adjacent to the second metatarsal head with adjacent cellulitis. 2. No evidence of osteomyelitis. Electronically Signed   By: Lorriane Shire M.D.   On: 05/12/2018 17:35   Mr Foot Left Wo Contrast  Result Date: 05/12/2018 CLINICAL DATA:  Soft tissue ulcerations of the lateral aspect of the foot. Foot swelling. EXAM: MRI OF THE LEFT FOOT WITHOUT CONTRAST TECHNIQUE: Multiplanar, multisequence MR imaging of the left foot was performed. No intravenous contrast was administered. COMPARISON:  Radiographs dated 05/10/2018 and MRIs dated 01/04/2018 and 05/15/2017 FINDINGS: Bones/Joint/Cartilage There is destruction abnormal edema in the base of the fifth metatarsal as well as in the distal and lateral aspects of the cuboid. There are new bony erosions involving the plantar aspect of the base of the third metatarsal as well as at the second TMT joint and at articulation of the navicular with the medial cuneiform.  There joint effusions at those sites as well as at the fourth TMT joint. Soft tissues There are soft tissue ulcerations that extend to the eroded base of the fifth metatarsal to the lateral aspect of the eroded cuboid. No discrete soft tissue abscesses. IMPRESSION: 1. 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 2. Stable erosive changes of the base of the second metatarsal. 3. 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 Electronically Signed   By: Lorriane Shire M.D.   On: 05/12/2018 17:28        Scheduled Meds: . atorvastatin  20 mg Oral QHS  . clopidogrel  75 mg Oral Q breakfast  . [START ON 05/15/2018] doxercalciferol  3 mcg Intravenous Q M,W,F-HD  . ferric citrate  420 mg Oral TID WC  . heparin  5,000 Units Subcutaneous Q8H  . insulin aspart  0-5 Units Subcutaneous QHS  . insulin aspart  0-9 Units Subcutaneous TID WC  . multivitamin with minerals  1 tablet Oral Daily  . primidone  50 mg Oral QHS  . sodium chloride flush  3 mL Intravenous Q12H  . timolol  1 drop Left Eye BID  . vitamin C  500 mg Oral Once per day on Mon Thu   Continuous Infusions: . sodium chloride    . vancomycin 1,000 mg (05/12/18 1105)     LOS: 3 days    Time spent: 35 mins.More than 50% of that time was spent in counseling and/or coordination of care.      Shelly Coss, MD [facility] Pager [phone]  If 7PM-7AM, please contact night-coverage [email or web address] Password [email or web address] 05/13/2018, 10:59 AM

## 2018-05-13 NOTE — Progress Notes (Signed)
PHARMACY CONSULT NOTE FOR:  OUTPATIENT  PARENTERAL ANTIBIOTIC THERAPY (OPAT) NOTE:  (This will be given on HD days at the HD center )  Indication: Osteomyelitis Regimen: Vancomycin 1gm IV every MWF with HD End date:  6/26 (if starting from initial dose here on 5/15).  Patient to get Vancomycin with HD and will not need OPAT orders pended.   Rober Minion, PharmD., MS Clinical Pharmacist Pager:  [phone] Thank you for allowing pharmacy to be part of this patients care team. 05/13/2018, 12:14 PM

## 2018-05-14 LAB — BASIC METABOLIC PANEL
Anion gap: 15 (ref 5–15)
BUN: 45 mg/dL — AB (ref 6–20)
CHLORIDE: 93 mmol/L — AB (ref 101–111)
CO2: 26 mmol/L (ref 22–32)
Calcium: 8.3 mg/dL — ABNORMAL LOW (ref 8.9–10.3)
Creatinine, Ser: 10.27 mg/dL — ABNORMAL HIGH (ref 0.61–1.24)
GFR calc Af Amer: 5 mL/min — ABNORMAL LOW (ref >60)
GFR calc non Af Amer: 5 mL/min — ABNORMAL LOW (ref >60)
Glucose, Bld: 198 mg/dL — ABNORMAL HIGH (ref 65–99)
Potassium: 4.1 mmol/L (ref 3.5–5.1)
SODIUM: 134 mmol/L — AB (ref 135–145)

## 2018-05-14 LAB — GLUCOSE, CAPILLARY
GLUCOSE-CAPILLARY: 197 mg/dL — AB (ref 65–99)
GLUCOSE-CAPILLARY: 182 mg/dL — AB (ref 65–99)
GLUCOSE-CAPILLARY: 133 mg/dL — AB (ref 65–99)

## 2018-05-14 MED ORDER — CLOPIDOGREL BISULFATE 75 MG PO TABS: 75.0000 mg | ORAL_TABLET | Freq: Every day | ORAL | 0 refills | Status: DC

## 2018-05-14 MED ORDER — VANCOMYCIN HCL IN DEXTROSE 1-5 GM/200ML-% IV SOLN: 1000.0000 mg | INTRAVENOUS | 0 refills | Status: AC

## 2018-05-14 NOTE — Progress Notes (Addendum)
[facility] Progress Note   Dialysis Orders: mwf   4h 42min   500/800  94kg  2/2.25 bath  Heparin 3800 vs 6000 (both in system)  LUA AVF  - hectorol 3ug tiw  - parsabiv 7.5mg  tiw iv w hd  Assessment/Plan: 1. Infected diabetic food ulcers MRSA +-  s/p PTA/stent of LLE artery with good results per [facility] - IV Vanc MRI 5/17 osteo 5th toe, no right foot osteo BC 5/15pending - no growth so far - we can continue Vanc at dialysis after d/c- 6 week total with ID f/u 2-4 weeks  2. ESRD -MWF - next HD Monday first round if not d/c today K 4.1 3. Anemia - hgb 11.2 -no on ESA yet 4. Secondary hyperparathyroidism - parsabiv not avail here - continue hectorol 5. HTN/volume - net UF 2.5 post wt 93.7 5/17 - no BP meds 6. Nutrition -renal carb mod/vits 7. DM-  Per primary 8. Dispo - probable dc today  [name], PA-C [place] [phone] 05/14/2018,8:34 AM  LOS: 4 days   Pt seen, examined and agree w A/P as above.  Kelly Splinter MD [employer] pager [phone]   05/14/2018, 3:41 PM    Subjective:   No c/o. Would like to go home  Objective Vitals:   05/13/18 1116 05/13/18 1605 05/13/18 1928 05/14/18 0424  BP: 121/70 134/79 134/74 133/70  Pulse: 73 71    Resp: 17 15 13 20   Temp: 98.7 F (37.1 C) 98.2 F (36.8 C) 98.1 F (36.7 C) 98.8 F (37.1 C)  TempSrc: Oral Oral Oral Oral  SpO2: 98% 97% 100% 94%  Weight:      Height:       Physical Exam General: thin male - NAD Heart: RRR Lungs: no rales Abdomen: soft NT Extremities: no LE edema, bilateral feet wrapped Dialysis Access: left AVF + bruit   Additional Objective Labs: Basic Metabolic Panel: Recent Labs  Lab 05/11/18 0616 05/12/18 0424 05/14/18 0417  NA 137 139 134*  K 4.0 4.3 4.1  CL 95* 98* 93*  CO2 28 27 26   GLUCOSE 178* 135* 198*  BUN 21* 33* 45*  CREATININE 7.67* 10.19* 10.27*  CALCIUM 7.8* 7.8* 8.3*   Liver Function Tests: Recent Labs  Lab  05/10/18 1834  AST 23  ALT 9*  ALKPHOS 70  BILITOT 1.2  PROT 6.6  ALBUMIN 2.7*   No results for input(s): LIPASE, AMYLASE in the last 168 hours. CBC: Recent Labs  Lab 05/10/18 1834 05/11/18 0616 05/12/18 0424  WBC 7.3 7.0 7.5  NEUTROABS 4.6  --   --   HGB 11.9* 11.8* 11.2*  HCT 37.5* 37.6* 35.3*  MCV 95.7 97.2 96.7  PLT 102* 187 185   Blood Culture    Component Value Date/Time   SDES BLOOD SITE NOT SPECIFIED 05/10/2018 1842   SPECREQUEST  05/10/2018 1842    BOTTLES DRAWN AEROBIC ONLY Blood Culture adequate volume   CULT  05/10/2018 1842    NO GROWTH 3 DAYS Performed at [facility], [place] [address], [place],  [address]    REPTSTATUS PENDING 05/10/2018 1842    Cardiac Enzymes: No results for input(s): CKTOTAL, CKMB, CKMBINDEX, TROPONINI in the last 168 hours. CBG: Recent Labs  Lab 05/12/18 2108 05/13/18 0617 05/13/18 1114 05/13/18 1603 05/14/18 0625  GLUCAP 143* 157* 168* 136* 197*   Iron Studies: No results for input(s): IRON, TIBC, TRANSFERRIN, FERRITIN in the last 72 hours. Lab Results  Component Value Date   INR 1.15 05/10/2018   INR 1.19 05/17/2017   INR 1.33 11/27/2014   Studies/Results: Mr Foot Right Wo Contrast  Result Date: 05/12/2018 CLINICAL DATA:  Soft tissue ulcerations of the plantar aspect of the right foot EXAM: MRI OF THE RIGHT FOREFOOT WITHOUT CONTRAST TECHNIQUE: Multiplanar, multisequence MR imaging of the right forefoot was performed. No intravenous contrast was administered. COMPARISON:  MRI dated 04/20/2018 and radiographs dated 01/12/2018 FINDINGS: Bones/Joint/Cartilage Multiple prior amputations including the second, fourth and fifth toes and distal third toe and the distal fifth metatarsal. Osteoarthritis of the first MTP joint. No joint effusions.  No visible osteomyelitis. Soft tissues Superficial soft tissue ulceration at the plantar aspect of the ball of the foot at the level of the head of second metatarsal, slightly  larger than on the prior study with increased edema in the subcutaneous fat adjacent to the ulcer consistent with focal cellulitis. No underlying abscess or osteomyelitis. IMPRESSION: 1. Slight progression of plantar soft tissue ulceration on the ball of the foot adjacent to the second metatarsal head with adjacent cellulitis. 2. No evidence of osteomyelitis. Electronically Signed   By: Lorriane Shire M.D.   On: 05/12/2018 17:35   Mr Foot Left Wo Contrast  Result Date: 05/12/2018 CLINICAL DATA:  Soft tissue ulcerations of the lateral aspect of the foot. Foot swelling. EXAM: MRI OF THE LEFT FOOT WITHOUT CONTRAST TECHNIQUE: Multiplanar, multisequence MR imaging of the left foot was performed. No intravenous contrast was administered. COMPARISON:  Radiographs dated 05/10/2018 and MRIs dated 01/04/2018 and 05/15/2017 FINDINGS: Bones/Joint/Cartilage There is destruction abnormal edema in the base of the fifth metatarsal as well as in the distal and lateral aspects of the cuboid. There are new bony erosions involving the plantar aspect of the base of the third metatarsal as well as at the second TMT joint and at articulation of the navicular with the medial cuneiform. There joint effusions at those sites as well as at the fourth TMT joint. Soft tissues There are soft tissue ulcerations that extend to the eroded base of the fifth metatarsal to the lateral aspect of the eroded cuboid. No discrete soft tissue abscesses. IMPRESSION: 1. 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 2. Stable erosive changes of the base of the second metatarsal. 3. 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 Electronically Signed   By: Lorriane Shire M.D.   On: 05/12/2018 17:28   Medications: . sodium chloride    . vancomycin 1,000 mg (05/12/18 1105)   . atorvastatin  20 mg Oral QHS  . clopidogrel  75 mg Oral Q breakfast  . [START ON 05/15/2018] doxercalciferol  3 mcg  Intravenous Q M,W,F-HD  . ferric citrate  420 mg Oral TID WC  . heparin  5,000 Units Subcutaneous Q8H  . insulin aspart  0-5 Units Subcutaneous QHS  . insulin aspart  0-9 Units Subcutaneous TID WC  . multivitamin with minerals  1 tablet Oral Daily  . primidone  50 mg Oral QHS  . sodium chloride flush  3 mL Intravenous Q12H  . timolol  1 drop Left Eye BID  . vitamin C  500 mg Oral Once per day on Mon Thu

## 2018-05-14 NOTE — Evaluation (Signed)
Physical Therapy Evaluation Patient Details Name: [name]. MRN: [id number] DOB: 12/04/1951 Today's Date: 05/14/2018   History of Present Illness    67 y.o.malewith medical history significant ofhypertension, hyperlipidemia, diabetes mellitus, ESRD-HD (MWF), PVD,s/p of amputation of multiple toes,anemia, who presents with left foot infection and fever and chills. He has a chronic foot ulcer/wound, currently one ulcer in the lateral side of left foot, 2 small ulcer in the right foot bottom. X-ray of left foot showed no osteomyelitis. Orthopedics, infectious disease, vascular surgery, nephrology consulted after admission.  MRI of the left foot shows progressive osteomyelitis and bone destruction of left cuboid and fifth metatarsal.  ID recommended to continue IV vancomycin post hemodialysis for total of 6 weeks.  He was also seen by orthopedics during this admission and recommended to follow-up as an outpatient as there is no plan for further intervention.  He was also evaluated by vascular surgery and he underwent angioplasty and stenting of left tibial peroneal trunk and angioplasty of the left posterior tibial artery.  He will follow-up with vascular surgery as an outpatient in 3 months.  Patient also underwent dialysis as per nephrology during his hospitalization.    Clinical Impression  Pt presents at/near baseline functional level for mobility, able to ambulate and perform tasks unassisted and without concern.  Taught basic strengthening exercises, and pt could benefit from OPPT follow up for support and progression as he desires to be more active and independent.  No further PT needs acutely.    Follow Up Recommendations No PT follow up(could benefit from OPPT for strengthening in near future)    Equipment Recommendations  None recommended by PT    Recommendations for Other Services       Precautions / Restrictions Precautions Precautions: Fall Precaution Comments: mild  risk due to decr sensation in feet Restrictions Weight Bearing Restrictions: No      Mobility  Bed Mobility Overal bed mobility: Independent                Transfers Overall transfer level: Independent                  Ambulation/Gait Ambulation/Gait assistance: Independent Ambulation Distance (Feet): 25 Feet   Gait Pattern/deviations: Step-through pattern     General Gait Details: describes feeling "wobbly" at baseline but no over LOB or instability noted in room  Stairs Stairs: Yes Stairs assistance: Modified independent (Device/Increase time) Stair Management: (discussed method and pt confident in ability)   General stair comments: uses rails after HD due to weakness  Wheelchair Mobility    Modified Rankin (Stroke Patients Only)       Balance Overall balance assessment: No apparent balance deficits (not formally assessed)                                           Pertinent Vitals/Pain Pain Assessment: No/denies pain    Home Living Family/patient expects to be discharged to:: Private residence Living Arrangements: Spouse/significant other Available Help at Discharge: Family Type of Home: House Home Access: Stairs to enter Entrance Stairs-Rails: None Entrance Stairs-Number of Steps: 2 Home Layout: Multi-level Home Equipment: Environmental consultant - 2 wheels;Cane - single point Additional Comments: rarely uses device for ambulation    Prior Function Level of Independence: Independent               Hand Dominance   Dominant Hand:  Right    Extremity/Trunk Assessment   Upper Extremity Assessment Upper Extremity Assessment: Overall WFL for tasks assessed    Lower Extremity Assessment Lower Extremity Assessment: Overall WFL for tasks assessed    Cervical / Trunk Assessment Cervical / Trunk Assessment: Normal  Communication   Communication: No difficulties  Cognition Arousal/Alertness: Awake/alert Behavior During Therapy:  WFL for tasks assessed/performed Overall Cognitive Status: Within Functional Limits for tasks assessed                                        General Comments      Exercises     Assessment/Plan    PT Assessment Patent does not need any further PT services  PT Problem List         PT Treatment Interventions      PT Goals (Current goals can be found in the Care Plan section)  Acute Rehab PT Goals PT Goal Formulation: All assessment and education complete, DC therapy    Frequency     Barriers to discharge        Co-evaluation               AM-PAC PT "6 Clicks" Daily Activity  Outcome Measure Difficulty turning over in bed (including adjusting bedclothes, sheets and blankets)?: None Difficulty moving from lying on back to sitting on the side of the bed? : None Difficulty sitting down on and standing up from a chair with arms (e.g., wheelchair, bedside commode, etc,.)?: None Help needed moving to and from a bed to chair (including a wheelchair)?: None Help needed walking in hospital room?: None Help needed climbing 3-5 steps with a railing? : A Little 6 Click Score: 23    End of Session   Activity Tolerance: Patient tolerated treatment well Patient left: in bed;with call bell/phone within reach Nurse Communication: Mobility status PT Visit Diagnosis: Difficulty in walking, not elsewhere classified (R26.2)    Time: 7673-4193 PT Time Calculation (min) (ACUTE ONLY): 29 min   Charges:   PT Evaluation $PT Eval Low Complexity: 1 Low PT Treatments $Therapeutic Exercise: 8-22 mins $Therapeutic Activity: 8-22 mins   PT G Codes:       Kearney Hard, PT, DPT, [facility] Board Certified Geriatric Clinical Specialist  Herbie Drape 05/14/2018, 67, 12:46 PM

## 2018-05-14 NOTE — Discharge Summary (Signed)
Physician Discharge Summary  [name] Sages Sr. CBS:[id number] DOB: 1951/11/04 DOA: 05/10/2018  PCP: [name], MD  Admit date: 05/10/2018 Discharge date: 05/14/2018  Admitted From: Home Disposition:  Home  Discharge Condition:Stable CODE STATUS:FULL Diet recommendation: Heart Healthy  Brief/Interim Summary:  [name] Kempis a 67 y.o.malewith medical history significant ofhypertension, hyperlipidemia, diabetes mellitus, ESRD-HD (MWF), PVD,s/p of amputation of multiple toes,anemia, who presents with left foot infection and fever and chills. He has a chronic foot ulcer/wound, currently one ulcer in the lateral side of left foot, 2 small ulcer in the right foot bottom.  Pt stated that Dr. Bridgett Larsson of [facility] was planning to do angiogram for his legs due to PAD . Patient had hypotension with blood pressure 81/54. He was given 500 cc of NS indialysiscenter and250 cc normal saline inED, his blood pressure improved to 162/83. In ED he was found to haveWBC 7.3, lactic acid 1.20, 1.59, potassium 4.2, bicarbonate 25, creatinine 5.03, BUN 14, temperature 101.3, heart rate in 90s, tachypnea, oxygen satting 90 to 97% on room air, negative chest x-ray. X-ray of left foot showed no osteomyelitis. Orthopedics, infectious disease, vascular surgery, nephrology consulted after admission.  MRI of the left foot shows progressive osteomyelitis and bone destruction of left cuboid and fifth metatarsal.  ID recommended to continue IV vancomycin post hemodialysis for total of 6 weeks,last dose will be on  on 06/21/18.  He was also seen by orthopedics during this admission and recommended to follow-up as an outpatient as there is no plan for further intervention.  He was also evaluated by vascular surgery and he underwent angioplasty and stenting of left tibial peroneal trunk and angioplasty of the left posterior tibial artery.  He will follow-up with vascular surgery as an outpatient in 3 months.  Patient  also underwent dialysis as per nephrology during his hospitalization. Patient is stable for discharge home today.  Following problems were addressed during his hospitalization:   Sepsis due to diabetic foot infection, osteomyelitis of left foot: -On admission patient met admission criteria of sepsis with fever, tachycardia, tachypnea, hypotensive. -On admissionT-max 102.9, WBC 7, lactic acid 1.4, procalcitonin 0.5 -Blood and wound cultures, negative to date -Will continue vancomycin for total of 6 weeks PHD.  He will follow-up with ID as an outpatient -Record indicates patient had ended up prolonged IV antibiotics on April 11 -Infectious disease team Dr. Baxter Flattery was following -On-call orthopedic teamfollowing recommending no intervention at this time. - MRI of lower extremities as above  Anemia due to end stage renal disease [facility]): Hemoglobin stable, 11.9 on admission  Benign essential HTN: -Patient was mildly hypotensive, today his blood pressure is stable -Patient is not on any home medication for blood pressure   Peripheral arterial disease ([place]): -S/p:Angioplasty and stenting of left tibioperoneal trunk  and angioplasty of the left posterior tibial artery. -Vascular surgery was following -Added Plavix -He would follow-up with vascular surgery as an outpatient.  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 Patient is taking70/30 insulin and SSIat home  HLD (hyperlipidemia); -Continue lipitor  Physical therapy him today with no recommendations.   Discharge Diagnoses:  Principal Problem:   Diabetic foot infection ([place]) Active Problems:   Anemia due to end stage renal disease (HCC)   Benign essential HTN   Peripheral arterial disease (HCC)   Osteomyelitis of left foot (HCC)   ESRD on hemodialysis (HCC)   Type II diabetes  mellitus with renal manifestations ([place])   Sepsis ([place])   HLD (hyperlipidemia)    Discharge Instructions  Discharge Instructions    Diet - low sodium heart healthy   Complete by:  As directed    Discharge instructions   Complete by:  As directed    1) Take Vancomycin 1 g during hemodialysis till 06/21/18. 2) Follow-up with your orthopedics physician as an outpatient in 2 weeks. 3) Follow up with infectious disease in 2 weeks.  Name and number of the provider has been attached. 4) Follow up with your PCP in a week. 5) You will be arranged follow-up with vascular surgery as an outpatient.   Increase activity slowly   Complete by:  As directed      Allergies as of 05/14/2018      Reactions   Penicillins Hives, Swelling, Other (See Comments)   Ankle swelling PATIENT HAD A PCN REACTION WITH IMMEDIATE RASH, FACIAL/TONGUE/THROAT SWELLING, SOB, OR LIGHTHEADEDNESS WITH HYPOTENSION:  #  #  #  YES  #  #  #   Has patient had a PCN reaction causing severe rash involving mucus membranes or skin necrosis: no Has patient had a PCN reaction that required hospitalization: no Has patient had a PCN reaction occurring within the last 10 years: no If all of the above answers are "NO", then may proceed with Cephalosporin use.      Medication List    TAKE these medications   acetaminophen 325 MG tablet Commonly known as:  TYLENOL Take 650 mg by mouth every 6 (six) hours as needed (pain).   AURYXIA 1 GM 210 MG(Fe) tablet Generic drug:  ferric citrate Take 420 mg by mouth 3 (three) times daily with meals.   clopidogrel 75 MG tablet Commonly known as:  PLAVIX Take 1 tablet (75 mg total) by mouth daily with breakfast. Start taking on:  05/15/2018   glucose monitoring kit monitoring kit 1 each by Does not apply route 4 (four) times daily - after meals and at bedtime. 1 month Diabetic Testing Supplies for QAC-QHS accuchecks.Any brand OK   HYDROcodone-acetaminophen 5-325 MG tablet Commonly known  as:  NORCO/VICODIN Take 1 tablet by mouth every 6 (six) hours as needed for moderate pain.   ibuprofen 200 MG tablet Commonly known as:  ADVIL,MOTRIN Take 400 mg by mouth as needed for moderate pain.   insulin NPH-regular Human (70-30) 100 UNIT/ML injection Commonly known as:  NOVOLIN 70/30 Inject 1-5 Units into the skin 2 (two) times daily with a meal. Sliding Scale   Insulin Syringe-Needle U-100 25G X 1" 1 ML Misc Any brand, for 4 times a day insulin SQ, 1 month supply.   LIPITOR 20 MG tablet Generic drug:  atorvastatin Take 20 mg by mouth at bedtime.   multivitamin with minerals tablet Take 1 tablet by mouth daily.   mupirocin ointment 2 % Commonly known as:  BACTROBAN Apply 1 application topically daily as needed (rash on stomach).   primidone 50 MG tablet Commonly known as:  MYSOLINE Take 0.5 tablets (25 mg total) by mouth at bedtime. What changed:  how much to take   timolol 0.5 % ophthalmic solution Commonly known as:  TIMOPTIC Place 1 drop into the left eye 2 (two) times daily.   vancomycin 1-5 GM/200ML-% Soln Commonly known as:  VANCOCIN Inject 200 mLs (1,000 mg total) into the vein every Monday, Wednesday, and Friday with hemodialysis for 17 doses. Start taking on:  05/15/2018   vitamin C 500 MG tablet  Commonly known as:  ASCORBIC ACID Take 500 mg by mouth 2 (two) times a week.      Follow-up Information    [name], MD. Schedule an appointment as soon as possible for a visit in 1 week(s).   Specialty:  Family Medicine Contact information: [place] [address] [phone]        Dorna Leitz, MD. Schedule an appointment as soon as possible for a visit in 2 week(s).   Specialty:  Orthopedic Surgery Contact information: [place] [address] [phone]        Campbell Riches, MD. Schedule an appointment as soon as possible for a visit in 2 week(s).   Specialty:  Infectious Diseases Contact  information: [address] [place] [address] [phone]          Allergies  Allergen Reactions  . Penicillins Hives, Swelling and Other (See Comments)    Ankle swelling  PATIENT HAD A PCN REACTION WITH IMMEDIATE RASH, FACIAL/TONGUE/THROAT SWELLING, SOB, OR LIGHTHEADEDNESS WITH HYPOTENSION:  #  #  #  YES  #  #  #   Has patient had a PCN reaction causing severe rash involving mucus membranes or skin necrosis: no Has patient had a PCN reaction that required hospitalization: no Has patient had a PCN reaction occurring within the last 10 years: no If all of the above answers are "NO", then may proceed with Cephalosporin use.    Consultations:  Vascular surgery, orthopedics, ID, nephrology   Procedures/Studies: Mr Foot Right Wo Contrast  Result Date: 05/12/2018 CLINICAL DATA:  Soft tissue ulcerations of the plantar aspect of the right foot EXAM: MRI OF THE RIGHT FOREFOOT WITHOUT CONTRAST TECHNIQUE: Multiplanar, multisequence MR imaging of the right forefoot was performed. No intravenous contrast was administered. COMPARISON:  MRI dated 04/20/2018 and radiographs dated 01/12/2018 FINDINGS: Bones/Joint/Cartilage Multiple prior amputations including the second, fourth and fifth toes and distal third toe and the distal fifth metatarsal. Osteoarthritis of the first MTP joint. No joint effusions.  No visible osteomyelitis. Soft tissues Superficial soft tissue ulceration at the plantar aspect of the ball of the foot at the level of the head of second metatarsal, slightly larger than on the prior study with increased edema in the subcutaneous fat adjacent to the ulcer consistent with focal cellulitis. No underlying abscess or osteomyelitis. IMPRESSION: 1. Slight progression of plantar soft tissue ulceration on the ball of the foot adjacent to the second metatarsal head with adjacent cellulitis. 2. No evidence of osteomyelitis. Electronically Signed   By: Lorriane Shire M.D.   On:  05/12/2018 17:35   Mr Foot Right Wo Contrast  Result Date: 04/21/2018 CLINICAL DATA:  Diabetic patient with a skin ulceration on the plantar surface of the foot near the third metatarsal. The patient is status post amputation of the right third toe 01/27/2018. EXAM: MRI OF THE RIGHT FOREFOOT WITHOUT CONTRAST TECHNIQUE: Multiplanar, multisequence MR imaging of the right forefoot was performed. No intravenous contrast was administered. COMPARISON:  Plain films right foot 01/12/2018 and 10/21/2016. MRI right foot 09/13/2016. FINDINGS: Bones/Joint/Cartilage As seen on the most recent plain films, the patient is status post amputation of the second toe, third toe at the level of the PIP joint, and fifth toe and distal fifth metatarsal. The fourth toe has been amputated since the most recent plain films. There is no bone marrow edema to suggest osteomyelitis. First MTP osteoarthritis is seen. Ligaments Intact. Muscles and Tendons No intramuscular fluid collection  or inflammatory change. Intrinsic musculature the foot is atrophied. Soft tissues Skin wound is seen on the plantar surface of the foot at the level of the second metatarsal head. No underlying abscess. IMPRESSION: Skin wound on the plantar surface of the foot at the level of the second metatarsal head without osteomyelitis or abscess. Status post amputation of the second and fourth toes, third toe at the level of the PIP joint and fifth toe and distal fifth metatarsal. First MTP osteoarthritis. Electronically Signed   By: Inge Rise M.D.   On: 04/21/2018 08:43   Mr Foot Left Wo Contrast  Result Date: 05/12/2018 CLINICAL DATA:  Soft tissue ulcerations of the lateral aspect of the foot. Foot swelling. EXAM: MRI OF THE LEFT FOOT WITHOUT CONTRAST TECHNIQUE: Multiplanar, multisequence MR imaging of the left foot was performed. No intravenous contrast was administered. COMPARISON:  Radiographs dated 05/10/2018 and MRIs dated 01/04/2018 and 05/15/2017  FINDINGS: Bones/Joint/Cartilage There is destruction abnormal edema in the base of the fifth metatarsal as well as in the distal and lateral aspects of the cuboid. There are new bony erosions involving the plantar aspect of the base of the third metatarsal as well as at the second TMT joint and at articulation of the navicular with the medial cuneiform. There joint effusions at those sites as well as at the fourth TMT joint. Soft tissues There are soft tissue ulcerations that extend to the eroded base of the fifth metatarsal to the lateral aspect of the eroded cuboid. No discrete soft tissue abscesses. IMPRESSION: 1. 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 2. Stable erosive changes of the base of the second metatarsal. 3. 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 Electronically Signed   By: Lorriane Shire M.D.   On: 05/12/2018 17:28   Dg Chest Port 1 View  Result Date: 05/10/2018 CLINICAL DATA:  Sepsis EXAM: PORTABLE CHEST 1 VIEW COMPARISON:  01/27/2018 FINDINGS: The heart size and mediastinal contours are within normal limits. Both lungs are clear. Mild aortic atherosclerosis. The visualized skeletal structures are unremarkable. IMPRESSION: No active disease. Electronically Signed   By: Donavan Foil M.D.   On: 05/10/2018 19:20   Dg Foot Complete Left  Result Date: 05/10/2018 CLINICAL DATA:  Foot wounds.  Evaluate for osteomyelitis. EXAM: LEFT FOOT - COMPLETE 3+ VIEW COMPARISON:  Foot MRI 01/04/2018 FINDINGS: Status post fifth ray resection. Prior fourth digit amputation. Partial amputations of the second and third digits. There is advanced erosive changes at the base of the fourth metatarsal. Irregularity is seen along the more medial Lisfranc joint with cystic type space at the base of the second metatarsal. Prominent erosion of the cuboid on the oblique view. Lateral midfoot ulcers. No tracking soft tissue gas. Osteopenia. IMPRESSION: Known  midfoot osteomyelitis, reference foot MRI 01/04/2018. There is associated prominent erosion of the fourth metatarsal base and cuboid. Electronically Signed   By: Monte Fantasia M.D.   On: 05/10/2018 21:48       Subjective: Patient seen and examined at bedside this morning.  Remains comfortable.  Stable for discharge home today.  Discharge Exam: Vitals:   05/14/18 0424 05/14/18 1114  BP: 133/70   Pulse:  72  Resp: 20 19  Temp: 98.8 F (37.1 C) 98.2 F (36.8 C)  SpO2: 94% 99%   Vitals:   05/13/18 1605 05/13/18 1928 05/14/18 0424 05/14/18 1114  BP: 134/79 134/74 133/70   Pulse: 71   72  Resp: 15 13  20 19  Temp: 98.2 F (36.8 C) 98.1 F (36.7 C) 98.8 F (37.1 C) 98.2 F (36.8 C)  TempSrc: Oral Oral Oral Oral  SpO2: 97% 100% 94% 99%  Weight:      Height:        General: Pt is alert, awake, not in acute distress Cardiovascular: RRR, S1/S2 +, no rubs, no gallops Respiratory: CTA bilaterally, no wheezing, no rhonchi Abdominal: Soft, NT, ND, bowel sounds + Extremities: no edema, no cyanosis, bilateral foot ulcers wrapped with dressings    The results of significant diagnostics from this hospitalization (including imaging, microbiology, ancillary and laboratory) are listed below for reference.     Microbiology: Recent Results (from the past 240 hour(s))  Aerobic Culture (superficial specimen)     Status: None   Collection Time: 05/09/18  1:05 PM  Result Value Ref Range Status   Specimen Description   Final    FOOT LEFT Performed at [facility] [address], [place], [place] [address]    Special Requests   Final    NONE Performed at [facility], [place] [address] [place], [place] [address]    Gram Stain   Final    RARE WBC PRESENT, PREDOMINANTLY PMN FEW GRAM POSITIVE COCCI Performed at [facility], [place] [address] [place], [place] [address]    Culture FEW METHICILLIN RESISTANT STAPHYLOCOCCUS AUREUS  Final    Report Status 05/12/2018 FINAL  Final   Organism ID, Bacteria METHICILLIN RESISTANT STAPHYLOCOCCUS AUREUS  Final      Susceptibility   Methicillin resistant staphylococcus aureus - MIC*    CIPROFLOXACIN >=8 RESISTANT Resistant     ERYTHROMYCIN >=8 RESISTANT Resistant     GENTAMICIN <=0.5 SENSITIVE Sensitive     OXACILLIN >=4 RESISTANT Resistant     TETRACYCLINE <=1 SENSITIVE Sensitive     VANCOMYCIN <=0.5 SENSITIVE Sensitive     TRIMETH/SULFA >=320 RESISTANT Resistant     CLINDAMYCIN <=0.25 SENSITIVE Sensitive     RIFAMPIN <=0.5 SENSITIVE Sensitive     Inducible Clindamycin NEGATIVE Sensitive     * FEW METHICILLIN RESISTANT STAPHYLOCOCCUS AUREUS  Blood Culture (routine x 2)     Status: None (Preliminary result)   Collection Time: 05/10/18  6:36 PM  Result Value Ref Range Status   Specimen Description BLOOD SITE NOT SPECIFIED  Final   Special Requests   Final    BOTTLES DRAWN AEROBIC ONLY Blood Culture adequate volume   Culture   Final    NO GROWTH 4 DAYS Performed at [facility], [address], [place], [place] [address]    Report Status PENDING  Incomplete  Blood Culture (routine x 2)     Status: None (Preliminary result)   Collection Time: 05/10/18  6:42 PM  Result Value Ref Range Status   Specimen Description BLOOD SITE NOT SPECIFIED  Final   Special Requests   Final    BOTTLES DRAWN AEROBIC ONLY Blood Culture adequate volume   Culture   Final    NO GROWTH 4 DAYS Performed at [facility], [address] [place],  [address]    Report Status PENDING  Incomplete     Labs: BNP (last 3 results) No results for input(s): BNP in the last 8760 hours. Basic Metabolic Panel: Recent Labs  Lab 05/10/18 1834 05/11/18 0616 05/12/18 0424 05/14/18 0417  NA 140 137 139 134*  K 4.2 4.0 4.3 4.1  CL 101 95* 98* 93*  CO2 25  '28 27 26  ' GLUCOSE 189* 178* 135* 198*  BUN 14 21* 33* 45*  CREATININE 5.03* 7.67* 10.19* 10.27*  CALCIUM 7.3* 7.8* 7.8* 8.3*   Liver  Function Tests: Recent Labs  Lab 05/10/18 1834  AST 23  ALT 9*  ALKPHOS 70  BILITOT 1.2  PROT 6.6  ALBUMIN 2.7*   No results for input(s): LIPASE, AMYLASE in the last 168 hours. No results for input(s): AMMONIA in the last 168 hours. CBC: Recent Labs  Lab 05/10/18 1834 05/11/18 0616 05/12/18 0424  WBC 7.3 7.0 7.5  NEUTROABS 4.6  --   --   HGB 11.9* 11.8* 11.2*  HCT 37.5* 37.6* 35.3*  MCV 95.7 97.2 96.7  PLT 102* 187 185   Cardiac Enzymes: No results for input(s): CKTOTAL, CKMB, CKMBINDEX, TROPONINI in the last 168 hours. BNP: Invalid input(s): POCBNP CBG: Recent Labs  Lab 05/13/18 0617 05/13/18 1114 05/13/18 1603 05/14/18 0625 05/14/18 1108  GLUCAP 157* 168* 136* 197* 182*   D-Dimer No results for input(s): DDIMER in the last 72 hours. Hgb A1c No results for input(s): HGBA1C in the last 72 hours. Lipid Profile No results for input(s): CHOL, HDL, LDLCALC, TRIG, CHOLHDL, LDLDIRECT in the last 72 hours. Thyroid function studies No results for input(s): TSH, T4TOTAL, T3FREE, THYROIDAB in the last 72 hours.  Invalid input(s): FREET3 Anemia work up No results for input(s): VITAMINB12, FOLATE, FERRITIN, TIBC, IRON, RETICCTPCT in the last 72 hours. Urinalysis    Component Value Date/Time   COLORURINE YELLOW 05/10/2018 [place] 05/10/2018 1734   LABSPEC 1.017 05/10/2018 1734   PHURINE 8.0 05/10/2018 1734   GLUCOSEU 50 (A) 05/10/2018 1734   HGBUR NEGATIVE 05/10/2018 1734   BILIRUBINUR NEGATIVE 05/10/2018 1734   KETONESUR NEGATIVE 05/10/2018 1734   PROTEINUR 100 (A) 05/10/2018 1734   UROBILINOGEN 0.2 11/22/2014 1740   NITRITE NEGATIVE 05/10/2018 1734   LEUKOCYTESUR TRACE (A) 05/10/2018 1734   Sepsis Labs Invalid input(s): PROCALCITONIN,  WBC,  LACTICIDVEN Microbiology Recent Results (from the past 240 hour(s))  Aerobic Culture (superficial specimen)     Status: None   Collection Time: 05/09/18  1:05 PM  Result Value Ref Range Status    Specimen Description   Final    FOOT LEFT Performed at [facility], [place] [address] [place], [place] [address]    Special Requests   Final    NONE Performed at [facility], [place] [address], [place], [place] [address]    Gram Stain   Final    RARE WBC PRESENT, PREDOMINANTLY PMN FEW GRAM POSITIVE COCCI Performed at [facility], [name] [address] [place],  [address]    Culture FEW METHICILLIN RESISTANT STAPHYLOCOCCUS AUREUS  Final   Report Status 05/12/2018 FINAL  Final   Organism ID, Bacteria METHICILLIN RESISTANT STAPHYLOCOCCUS AUREUS  Final      Susceptibility   Methicillin resistant staphylococcus aureus - MIC*    CIPROFLOXACIN >=8 RESISTANT Resistant     ERYTHROMYCIN >=8 RESISTANT Resistant     GENTAMICIN <=0.5 SENSITIVE Sensitive     OXACILLIN >=4 RESISTANT Resistant     TETRACYCLINE <=1 SENSITIVE Sensitive     VANCOMYCIN <=0.5 SENSITIVE Sensitive     TRIMETH/SULFA >=320 RESISTANT Resistant     CLINDAMYCIN <=0.25 SENSITIVE Sensitive     RIFAMPIN <=0.5 SENSITIVE Sensitive     Inducible Clindamycin NEGATIVE Sensitive     * FEW METHICILLIN RESISTANT STAPHYLOCOCCUS AUREUS  Blood Culture (routine x 2)  Status: None (Preliminary result)   Collection Time: 05/10/18  6:36 PM  Result Value Ref Range Status   Specimen Description BLOOD SITE NOT SPECIFIED  Final   Special Requests   Final    BOTTLES DRAWN AEROBIC ONLY Blood Culture adequate volume   Culture   Final    NO GROWTH 4 DAYS Performed at [facility], [address] [place], [place] [address]    Report Status PENDING  Incomplete  Blood Culture (routine x 2)     Status: None (Preliminary result)   Collection Time: 05/10/18  6:42 PM  Result Value Ref Range Status   Specimen Description BLOOD SITE NOT SPECIFIED  Final   Special Requests   Final    BOTTLES DRAWN AEROBIC ONLY Blood Culture adequate volume   Culture   Final    NO GROWTH 4 DAYS Performed at [facility], [address], [place], [place] [address]    Report Status PENDING  Incomplete    Please note: You were cared for by a hospitalist during your hospital stay. Once you are discharged, your primary care physician will handle any further medical issues. Please note that NO REFILLS for any discharge medications will be authorized once you are discharged, as it is imperative that you return to your primary care physician (or establish a relationship with a primary care physician if you do not have one) for your post hospital discharge needs so that they can reassess your need for medications and monitor your lab values.    Time coordinating discharge: 40 minutes  SIGNED:   Shelly Coss, MD  [facility] 05/14/2018, 1:04 PM Pager [phone]  If 7PM-7AM, please contact night-coverage [email or web address] Password [email or web address]

## 2018-05-14 NOTE — Care Management Note (Signed)
Case Management Note Marvetta Gibbons RN, BSN Unit 4E-Case Manager [phone]  Patient Details  Name: [name] FRERKING Sr. MRN: [id number] Date of Birth: Apr 27, 1951  Subjective/Objective:   Pt admitted with DM foot infection/osteomylitis                 Action/Plan: PTA Pt lived at home- referral for HHRN/aide needs- per renal note plan for 6 wks of IV abx with HD- spoke with pt at bedside for further [facility] needs- per pt he is followed at the wound care center for wound care needs and plans to f/u with orthopedic team for further interventions. Pt has had HH in the past with [facility] and per pt had a [employer] wound vac at one point in time. Pt would prefer to have referral to [facility] placed and then see what Wound Care center recommends for home wound care- [facility] to [facility]   Expected Discharge Date:  05/14/18               Expected Discharge Plan:  [place]  In-House Referral:     Discharge planning Services  CM Consult  Post Acute Care Choice:  Home Health Choice offered to:  Patient  DME Arranged:    DME Agency:     HH Arranged:  RN [place] Agency:  [facility] (formerly [employer])  Status of Service:  Completed, signed off  If discussed at [employer], dates discussed:    Discharge Disposition: home/home health   Additional Comments:  Dawayne Patricia, RN 05/14/2018, 1:43 PM

## 2018-05-15 DIAGNOSIS — N186 End stage renal disease: Secondary | ICD-10-CM | POA: Diagnosis not present

## 2018-05-15 DIAGNOSIS — N2581 Secondary hyperparathyroidism of renal origin: Secondary | ICD-10-CM | POA: Diagnosis not present

## 2018-05-15 DIAGNOSIS — E1121 Type 2 diabetes mellitus with diabetic nephropathy: Secondary | ICD-10-CM | POA: Diagnosis not present

## 2018-05-15 DIAGNOSIS — D509 Iron deficiency anemia, unspecified: Secondary | ICD-10-CM | POA: Diagnosis not present

## 2018-05-15 DIAGNOSIS — M869 Osteomyelitis, unspecified: Secondary | ICD-10-CM | POA: Diagnosis not present

## 2018-05-15 DIAGNOSIS — Z992 Dependence on renal dialysis: Secondary | ICD-10-CM | POA: Diagnosis not present

## 2018-05-15 LAB — CULTURE, BLOOD (ROUTINE X 2)
CULTURE: NO GROWTH
Culture: NO GROWTH
SPECIAL REQUESTS: ADEQUATE
SPECIAL REQUESTS: ADEQUATE

## 2018-05-15 LAB — GLUCOSE, CAPILLARY: Glucose-Capillary: 130 mg/dL — ABNORMAL HIGH (ref 65–99)

## 2018-05-15 NOTE — Consult Note (Signed)
            [facility] CM Primary Care Navigator  05/15/2018  [name] Sr. August 22, 1951 [id number]   Attempttoseepatient at the bedsideto identify possible discharge needs buthe wasalreadydischarged.   Per chart review, patient was admitted with diabetic foot infection/ osteomyelitis.  Patientwas discharged homewith home health services over the weekend.  Patient hasdischarge instruction to follow-up withprimary care providerin 1 week; infectious disease and orthopedic surgery in 2 weeks.   For additional questions please contact:  Edwena Felty A. Chrsitopher Wik, BSN, RN-BC [facility] PRIMARY CARE Navigator Cell: [phone]

## 2018-05-16 ENCOUNTER — Other Ambulatory Visit: Payer: Self-pay | Admitting: *Deleted

## 2018-05-16 DIAGNOSIS — E11621 Type 2 diabetes mellitus with foot ulcer: Secondary | ICD-10-CM | POA: Diagnosis not present

## 2018-05-16 DIAGNOSIS — L97422 Non-pressure chronic ulcer of left heel and midfoot with fat layer exposed: Secondary | ICD-10-CM | POA: Diagnosis not present

## 2018-05-16 DIAGNOSIS — N186 End stage renal disease: Secondary | ICD-10-CM | POA: Diagnosis not present

## 2018-05-16 DIAGNOSIS — I509 Heart failure, unspecified: Secondary | ICD-10-CM | POA: Diagnosis not present

## 2018-05-16 DIAGNOSIS — L97512 Non-pressure chronic ulcer of other part of right foot with fat layer exposed: Secondary | ICD-10-CM | POA: Diagnosis not present

## 2018-05-16 DIAGNOSIS — I132 Hypertensive heart and chronic kidney disease with heart failure and with stage 5 chronic kidney disease, or end stage renal disease: Secondary | ICD-10-CM | POA: Diagnosis not present

## 2018-05-16 DIAGNOSIS — E1122 Type 2 diabetes mellitus with diabetic chronic kidney disease: Secondary | ICD-10-CM | POA: Diagnosis not present

## 2018-05-16 DIAGNOSIS — E1140 Type 2 diabetes mellitus with diabetic neuropathy, unspecified: Secondary | ICD-10-CM | POA: Diagnosis not present

## 2018-05-16 DIAGNOSIS — L97522 Non-pressure chronic ulcer of other part of left foot with fat layer exposed: Secondary | ICD-10-CM | POA: Diagnosis not present

## 2018-05-16 DIAGNOSIS — Z992 Dependence on renal dialysis: Secondary | ICD-10-CM | POA: Diagnosis not present

## 2018-05-16 MED ORDER — CLOPIDOGREL BISULFATE 75 MG PO TABS: 75.0000 mg | ORAL_TABLET | Freq: Every day | ORAL | 11 refills | Status: DC

## 2018-05-17 ENCOUNTER — Encounter: Payer: Self-pay | Admitting: Infectious Diseases

## 2018-05-17 DIAGNOSIS — M869 Osteomyelitis, unspecified: Secondary | ICD-10-CM | POA: Diagnosis not present

## 2018-05-17 DIAGNOSIS — E1121 Type 2 diabetes mellitus with diabetic nephropathy: Secondary | ICD-10-CM | POA: Diagnosis not present

## 2018-05-17 DIAGNOSIS — D509 Iron deficiency anemia, unspecified: Secondary | ICD-10-CM | POA: Diagnosis not present

## 2018-05-17 DIAGNOSIS — N2581 Secondary hyperparathyroidism of renal origin: Secondary | ICD-10-CM | POA: Diagnosis not present

## 2018-05-17 DIAGNOSIS — N186 End stage renal disease: Secondary | ICD-10-CM | POA: Diagnosis not present

## 2018-05-17 DIAGNOSIS — Z992 Dependence on renal dialysis: Secondary | ICD-10-CM | POA: Diagnosis not present

## 2018-05-19 DIAGNOSIS — N2581 Secondary hyperparathyroidism of renal origin: Secondary | ICD-10-CM | POA: Diagnosis not present

## 2018-05-19 DIAGNOSIS — M869 Osteomyelitis, unspecified: Secondary | ICD-10-CM | POA: Diagnosis not present

## 2018-05-19 DIAGNOSIS — N186 End stage renal disease: Secondary | ICD-10-CM | POA: Diagnosis not present

## 2018-05-19 DIAGNOSIS — Z992 Dependence on renal dialysis: Secondary | ICD-10-CM | POA: Diagnosis not present

## 2018-05-19 DIAGNOSIS — D509 Iron deficiency anemia, unspecified: Secondary | ICD-10-CM | POA: Diagnosis not present

## 2018-05-19 DIAGNOSIS — E1121 Type 2 diabetes mellitus with diabetic nephropathy: Secondary | ICD-10-CM | POA: Diagnosis not present

## 2018-05-22 DIAGNOSIS — N2581 Secondary hyperparathyroidism of renal origin: Secondary | ICD-10-CM | POA: Diagnosis not present

## 2018-05-22 DIAGNOSIS — E1121 Type 2 diabetes mellitus with diabetic nephropathy: Secondary | ICD-10-CM | POA: Diagnosis not present

## 2018-05-22 DIAGNOSIS — Z992 Dependence on renal dialysis: Secondary | ICD-10-CM | POA: Diagnosis not present

## 2018-05-22 DIAGNOSIS — D509 Iron deficiency anemia, unspecified: Secondary | ICD-10-CM | POA: Diagnosis not present

## 2018-05-22 DIAGNOSIS — N186 End stage renal disease: Secondary | ICD-10-CM | POA: Diagnosis not present

## 2018-05-22 DIAGNOSIS — M869 Osteomyelitis, unspecified: Secondary | ICD-10-CM | POA: Diagnosis not present

## 2018-05-23 DIAGNOSIS — N186 End stage renal disease: Secondary | ICD-10-CM | POA: Diagnosis not present

## 2018-05-23 DIAGNOSIS — I132 Hypertensive heart and chronic kidney disease with heart failure and with stage 5 chronic kidney disease, or end stage renal disease: Secondary | ICD-10-CM | POA: Diagnosis not present

## 2018-05-23 DIAGNOSIS — L97522 Non-pressure chronic ulcer of other part of left foot with fat layer exposed: Secondary | ICD-10-CM | POA: Diagnosis not present

## 2018-05-23 DIAGNOSIS — E11621 Type 2 diabetes mellitus with foot ulcer: Secondary | ICD-10-CM | POA: Diagnosis not present

## 2018-05-23 DIAGNOSIS — L97512 Non-pressure chronic ulcer of other part of right foot with fat layer exposed: Secondary | ICD-10-CM | POA: Diagnosis not present

## 2018-05-23 DIAGNOSIS — L97422 Non-pressure chronic ulcer of left heel and midfoot with fat layer exposed: Secondary | ICD-10-CM | POA: Diagnosis not present

## 2018-05-24 DIAGNOSIS — N2581 Secondary hyperparathyroidism of renal origin: Secondary | ICD-10-CM | POA: Diagnosis not present

## 2018-05-24 DIAGNOSIS — N186 End stage renal disease: Secondary | ICD-10-CM | POA: Diagnosis not present

## 2018-05-24 DIAGNOSIS — D509 Iron deficiency anemia, unspecified: Secondary | ICD-10-CM | POA: Diagnosis not present

## 2018-05-24 DIAGNOSIS — Z992 Dependence on renal dialysis: Secondary | ICD-10-CM | POA: Diagnosis not present

## 2018-05-24 DIAGNOSIS — M869 Osteomyelitis, unspecified: Secondary | ICD-10-CM | POA: Diagnosis not present

## 2018-05-24 DIAGNOSIS — E1121 Type 2 diabetes mellitus with diabetic nephropathy: Secondary | ICD-10-CM | POA: Diagnosis not present

## 2018-05-25 IMAGING — DX DG CHEST 2V
2 series · 2 of 2 positions shown · non-contrast
Comparison: 05/21/2017

CLINICAL DATA: Proper evaluation for toe amputation, former smoker,
hypertension, diabetes mellitus

EXAM:
CHEST  2 VIEW

[chest pa]
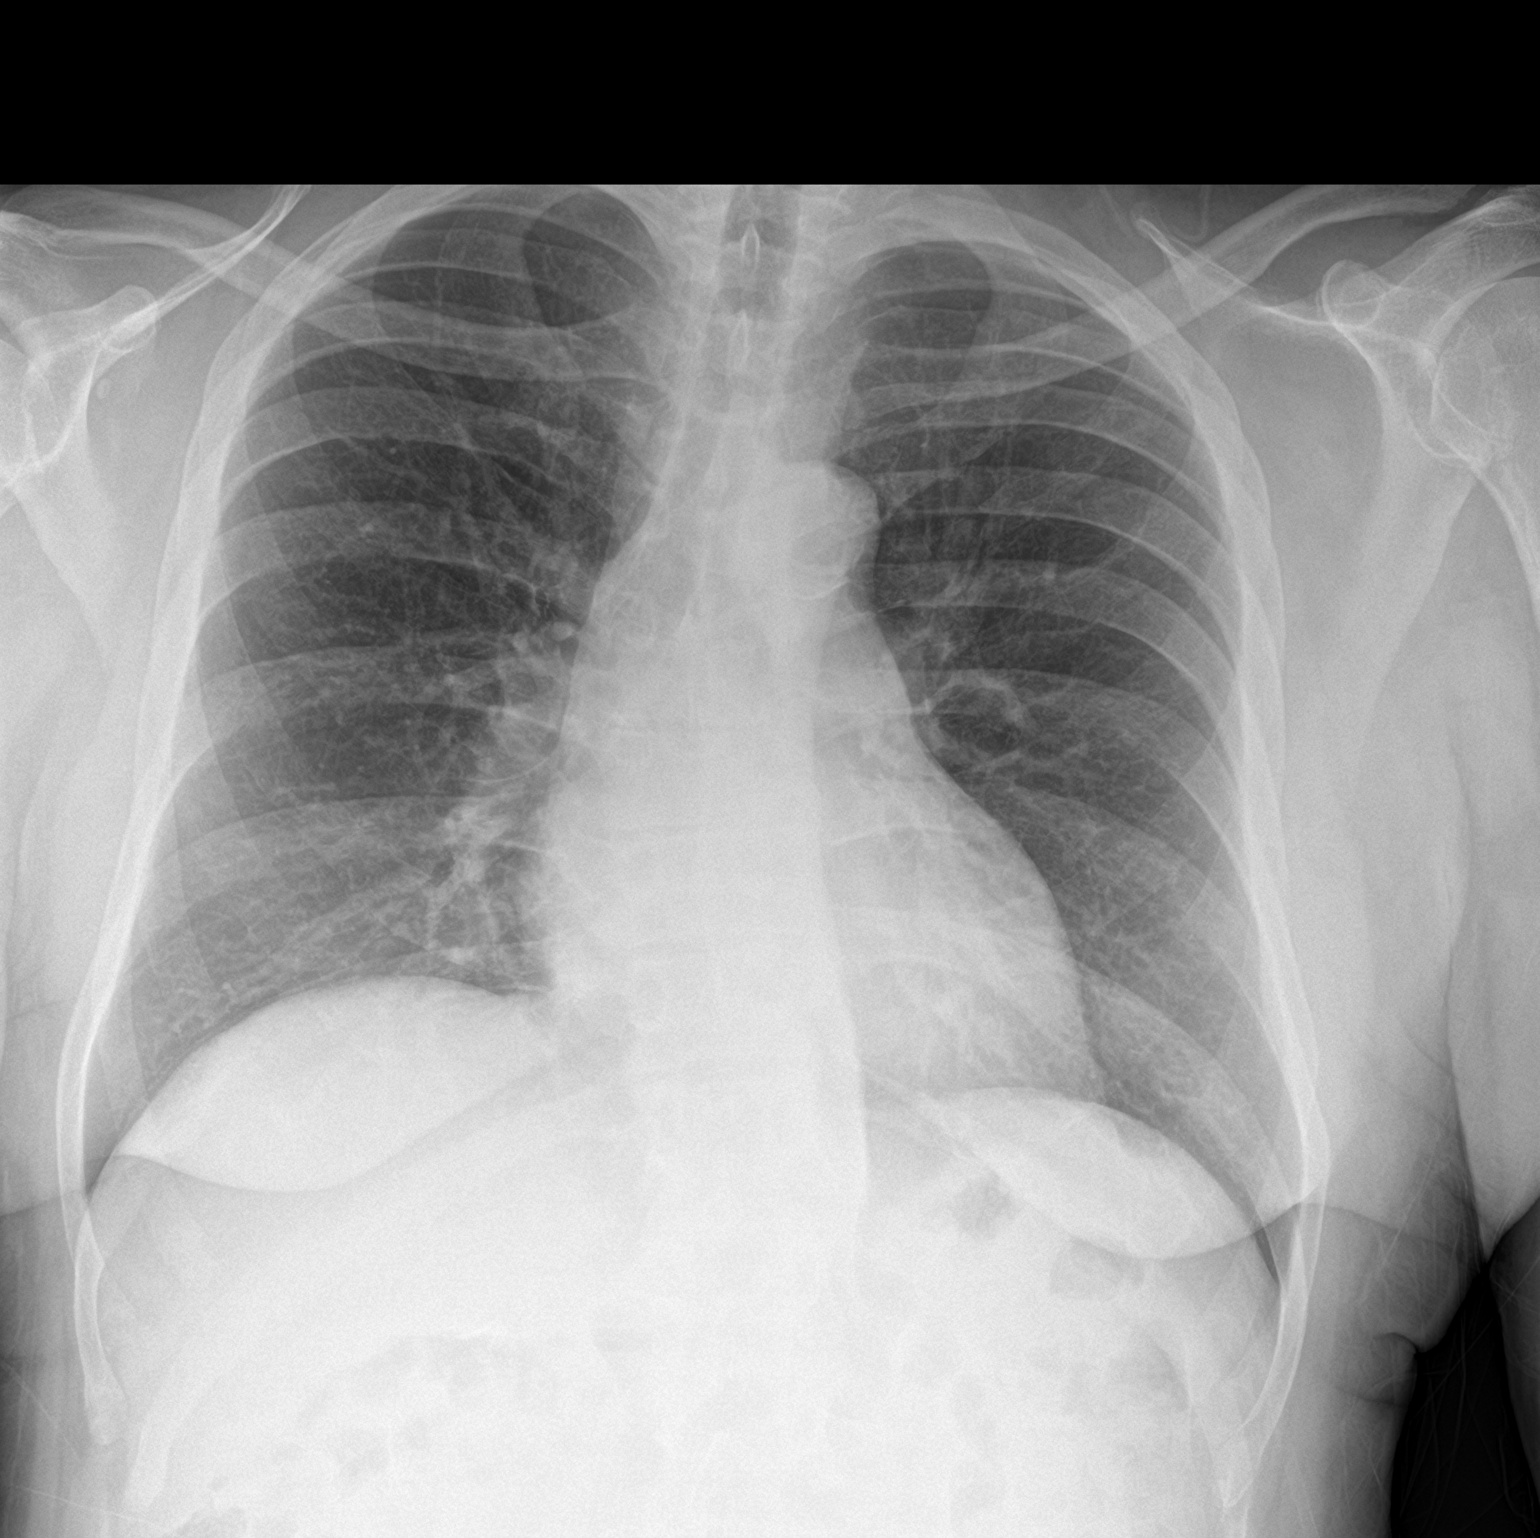

[chest lat]
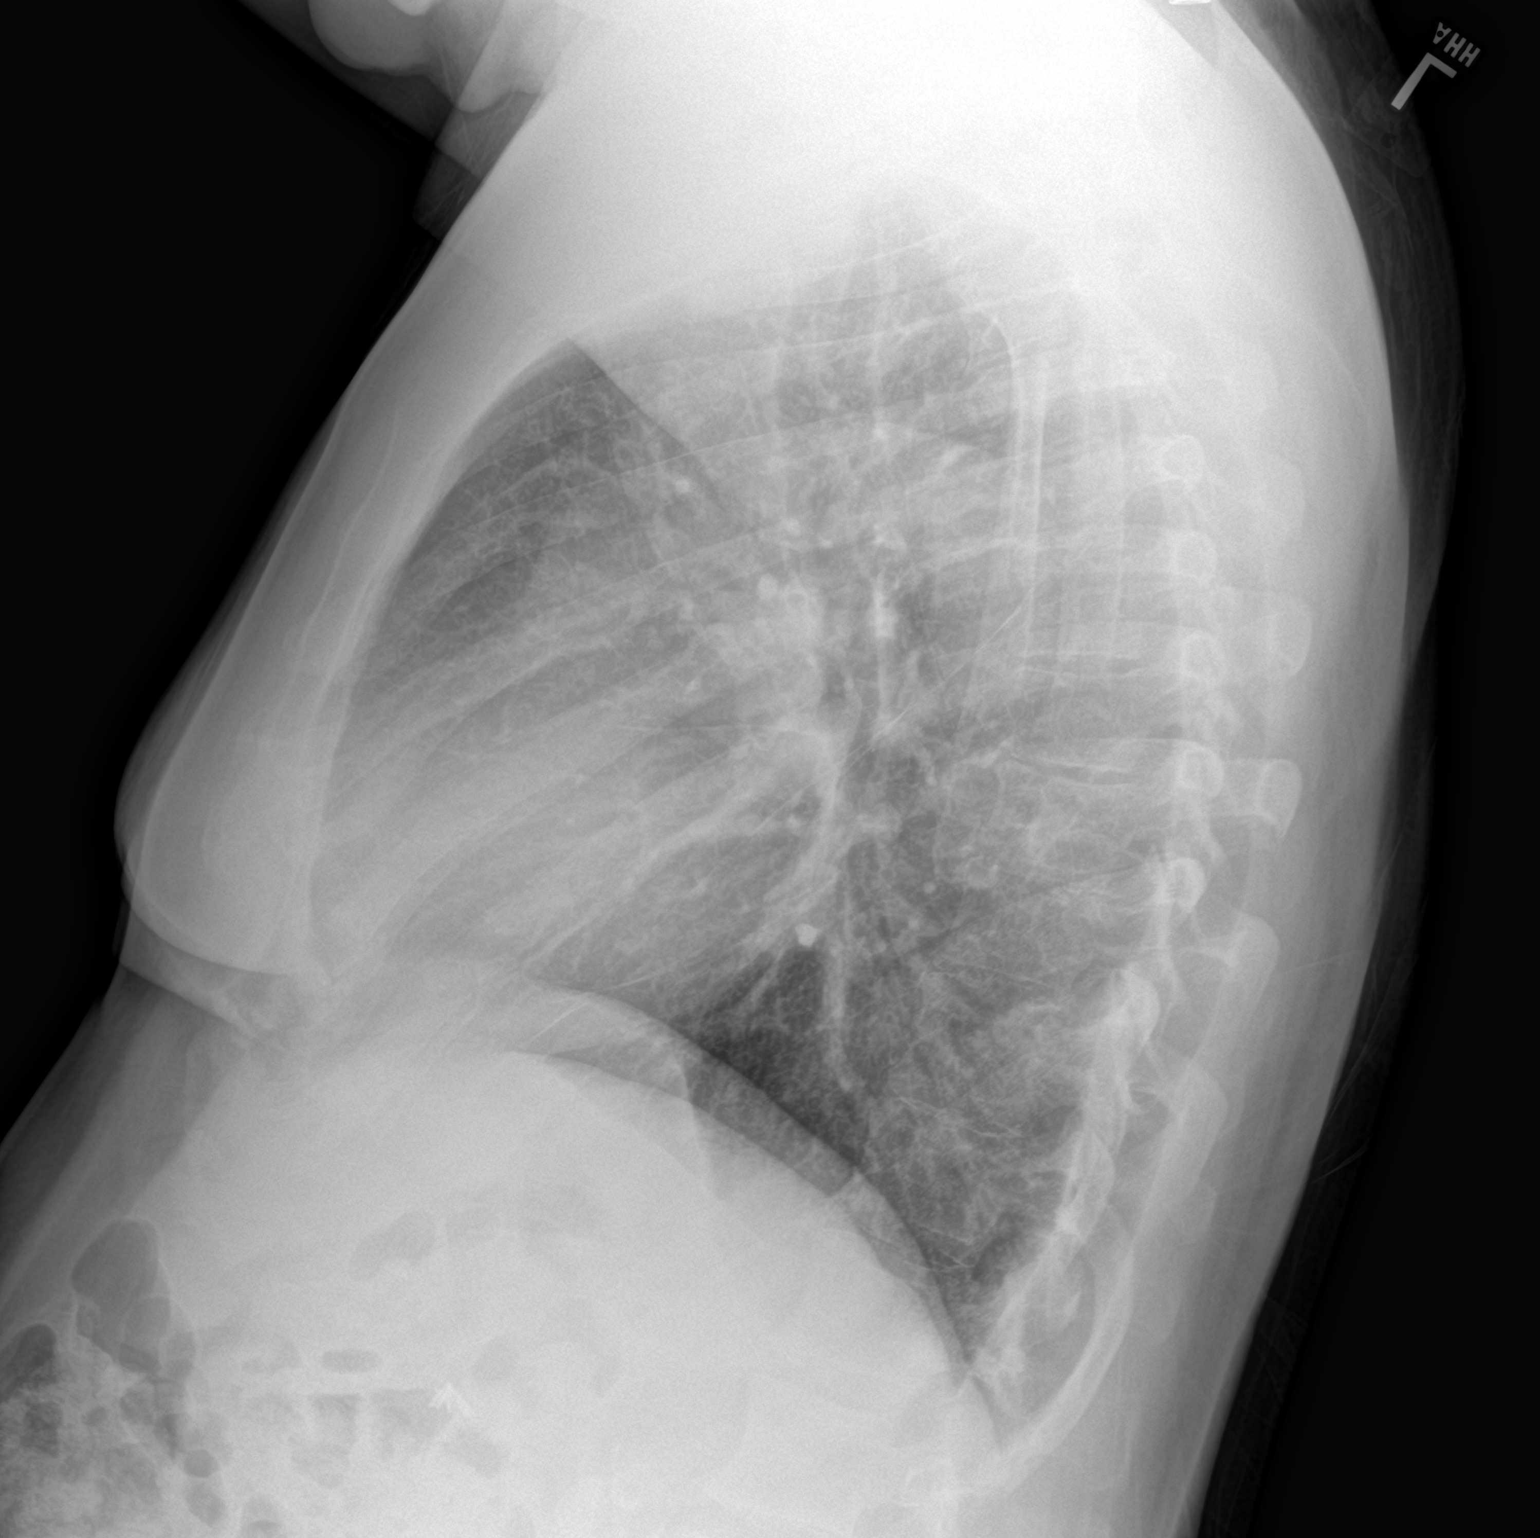

[2 of 2 positions shown; findings below may reference images not displayed]

FINDINGS: Normal heart size, mediastinal contours, and pulmonary vascularity.

Atherosclerotic calcification aorta.

Lungs clear.

No pulmonary infiltrate, pleural effusion or pneumothorax.

Thoracolumbar scoliosis.
IMPRESSION: No acute abnormalities.

## 2018-05-26 ENCOUNTER — Encounter: Payer: Self-pay | Admitting: Infectious Diseases

## 2018-05-26 DIAGNOSIS — M869 Osteomyelitis, unspecified: Secondary | ICD-10-CM | POA: Diagnosis not present

## 2018-05-26 DIAGNOSIS — Z992 Dependence on renal dialysis: Secondary | ICD-10-CM | POA: Diagnosis not present

## 2018-05-26 DIAGNOSIS — N2581 Secondary hyperparathyroidism of renal origin: Secondary | ICD-10-CM | POA: Diagnosis not present

## 2018-05-26 DIAGNOSIS — N186 End stage renal disease: Secondary | ICD-10-CM | POA: Diagnosis not present

## 2018-05-26 DIAGNOSIS — D509 Iron deficiency anemia, unspecified: Secondary | ICD-10-CM | POA: Diagnosis not present

## 2018-05-26 DIAGNOSIS — E1121 Type 2 diabetes mellitus with diabetic nephropathy: Secondary | ICD-10-CM | POA: Diagnosis not present

## 2018-05-27 DIAGNOSIS — N186 End stage renal disease: Secondary | ICD-10-CM | POA: Diagnosis not present

## 2018-05-27 DIAGNOSIS — Z992 Dependence on renal dialysis: Secondary | ICD-10-CM | POA: Diagnosis not present

## 2018-05-27 DIAGNOSIS — E1122 Type 2 diabetes mellitus with diabetic chronic kidney disease: Secondary | ICD-10-CM | POA: Diagnosis not present

## 2018-05-29 DIAGNOSIS — E1121 Type 2 diabetes mellitus with diabetic nephropathy: Secondary | ICD-10-CM | POA: Diagnosis not present

## 2018-05-29 DIAGNOSIS — N186 End stage renal disease: Secondary | ICD-10-CM | POA: Diagnosis not present

## 2018-05-29 DIAGNOSIS — M869 Osteomyelitis, unspecified: Secondary | ICD-10-CM | POA: Diagnosis not present

## 2018-05-29 DIAGNOSIS — N2581 Secondary hyperparathyroidism of renal origin: Secondary | ICD-10-CM | POA: Diagnosis not present

## 2018-05-29 DIAGNOSIS — Z992 Dependence on renal dialysis: Secondary | ICD-10-CM | POA: Diagnosis not present

## 2018-05-30 ENCOUNTER — Encounter (HOSPITAL_BASED_OUTPATIENT_CLINIC_OR_DEPARTMENT_OTHER): Payer: Medicare Other | Attending: Internal Medicine

## 2018-05-30 DIAGNOSIS — I509 Heart failure, unspecified: Secondary | ICD-10-CM | POA: Insufficient documentation

## 2018-05-30 DIAGNOSIS — E11621 Type 2 diabetes mellitus with foot ulcer: Secondary | ICD-10-CM | POA: Insufficient documentation

## 2018-05-30 DIAGNOSIS — N185 Chronic kidney disease, stage 5: Secondary | ICD-10-CM | POA: Insufficient documentation

## 2018-05-30 DIAGNOSIS — L97525 Non-pressure chronic ulcer of other part of left foot with muscle involvement without evidence of necrosis: Secondary | ICD-10-CM | POA: Diagnosis not present

## 2018-05-30 DIAGNOSIS — E1140 Type 2 diabetes mellitus with diabetic neuropathy, unspecified: Secondary | ICD-10-CM | POA: Insufficient documentation

## 2018-05-30 DIAGNOSIS — I1311 Hypertensive heart and chronic kidney disease without heart failure, with stage 5 chronic kidney disease, or end stage renal disease: Secondary | ICD-10-CM | POA: Diagnosis not present

## 2018-05-30 DIAGNOSIS — L97422 Non-pressure chronic ulcer of left heel and midfoot with fat layer exposed: Secondary | ICD-10-CM | POA: Insufficient documentation

## 2018-05-30 DIAGNOSIS — E1122 Type 2 diabetes mellitus with diabetic chronic kidney disease: Secondary | ICD-10-CM | POA: Insufficient documentation

## 2018-05-30 DIAGNOSIS — L97512 Non-pressure chronic ulcer of other part of right foot with fat layer exposed: Secondary | ICD-10-CM | POA: Diagnosis not present

## 2018-05-30 DIAGNOSIS — L97522 Non-pressure chronic ulcer of other part of left foot with fat layer exposed: Secondary | ICD-10-CM | POA: Diagnosis not present

## 2018-05-30 DIAGNOSIS — L02612 Cutaneous abscess of left foot: Secondary | ICD-10-CM | POA: Diagnosis not present

## 2018-05-30 DIAGNOSIS — Z992 Dependence on renal dialysis: Secondary | ICD-10-CM | POA: Insufficient documentation

## 2018-05-30 DIAGNOSIS — B9562 Methicillin resistant Staphylococcus aureus infection as the cause of diseases classified elsewhere: Secondary | ICD-10-CM | POA: Insufficient documentation

## 2018-05-31 ENCOUNTER — Encounter: Payer: Self-pay | Admitting: Infectious Diseases

## 2018-05-31 DIAGNOSIS — M869 Osteomyelitis, unspecified: Secondary | ICD-10-CM | POA: Diagnosis not present

## 2018-05-31 DIAGNOSIS — N2581 Secondary hyperparathyroidism of renal origin: Secondary | ICD-10-CM | POA: Diagnosis not present

## 2018-05-31 DIAGNOSIS — Z992 Dependence on renal dialysis: Secondary | ICD-10-CM | POA: Diagnosis not present

## 2018-05-31 DIAGNOSIS — E1121 Type 2 diabetes mellitus with diabetic nephropathy: Secondary | ICD-10-CM | POA: Diagnosis not present

## 2018-05-31 DIAGNOSIS — E11621 Type 2 diabetes mellitus with foot ulcer: Secondary | ICD-10-CM | POA: Diagnosis not present

## 2018-05-31 DIAGNOSIS — N186 End stage renal disease: Secondary | ICD-10-CM | POA: Diagnosis not present

## 2018-06-01 DIAGNOSIS — N289 Disorder of kidney and ureter, unspecified: Secondary | ICD-10-CM | POA: Diagnosis not present

## 2018-06-01 DIAGNOSIS — E119 Type 2 diabetes mellitus without complications: Secondary | ICD-10-CM | POA: Diagnosis not present

## 2018-06-01 DIAGNOSIS — N529 Male erectile dysfunction, unspecified: Secondary | ICD-10-CM | POA: Diagnosis not present

## 2018-06-01 DIAGNOSIS — A419 Sepsis, unspecified organism: Secondary | ICD-10-CM | POA: Diagnosis not present

## 2018-06-02 ENCOUNTER — Ambulatory Visit (INDEPENDENT_AMBULATORY_CARE_PROVIDER_SITE_OTHER): Payer: Medicare Other | Admitting: Infectious Diseases

## 2018-06-02 ENCOUNTER — Telehealth: Payer: Self-pay | Admitting: Pharmacist

## 2018-06-02 DIAGNOSIS — N186 End stage renal disease: Secondary | ICD-10-CM | POA: Diagnosis not present

## 2018-06-02 DIAGNOSIS — I739 Peripheral vascular disease, unspecified: Secondary | ICD-10-CM

## 2018-06-02 DIAGNOSIS — M86172 Other acute osteomyelitis, left ankle and foot: Secondary | ICD-10-CM

## 2018-06-02 DIAGNOSIS — L97529 Non-pressure chronic ulcer of other part of left foot with unspecified severity: Secondary | ICD-10-CM

## 2018-06-02 DIAGNOSIS — Z992 Dependence on renal dialysis: Secondary | ICD-10-CM

## 2018-06-02 DIAGNOSIS — M869 Osteomyelitis, unspecified: Secondary | ICD-10-CM | POA: Diagnosis not present

## 2018-06-02 DIAGNOSIS — E1121 Type 2 diabetes mellitus with diabetic nephropathy: Secondary | ICD-10-CM | POA: Diagnosis not present

## 2018-06-02 DIAGNOSIS — E11621 Type 2 diabetes mellitus with foot ulcer: Secondary | ICD-10-CM

## 2018-06-02 DIAGNOSIS — N2581 Secondary hyperparathyroidism of renal origin: Secondary | ICD-10-CM | POA: Diagnosis not present

## 2018-06-02 MED ORDER — DOXYCYCLINE HYCLATE 100 MG PO TABS
100.0000 mg | ORAL_TABLET | Freq: Two times a day (BID) | ORAL | 1 refills | Status: DC
Start: 2018-06-22 — End: 2018-06-20

## 2018-06-02 NOTE — Assessment & Plan Note (Signed)
Appears to be doing well for now.  Will start doxy when he finishes vanco.  Will see him back in 1 month.  Will try to get his labs from HD

## 2018-06-02 NOTE — Progress Notes (Signed)
   Subjective:    Patient ID: PILOT PRINDLE Sr., male    DOB: [birth year], 67 y.o.   MRN: [id number]  HPI 67yo M with hx of DM2, ESRD, CAD, and ulcers on R first metatarsal head; L 5th and L 1st MT heads.  He was seen in ID on 5-21-18with worsening of his R 5th ray and was sent to hospital. His wound Cx was polymicrobial. He underwent R 5th ray amputation.    On 01-27-18 he underwent(by Dr [name])  1. 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 2. Excisional debridement of plantar wound. 3. Amputation of the left 2nd toe at the interphalangeal joint. 4. Excisional debridement of skin, subcutaneous tissue, and bone at the site of a complex wound, left foot.  He had ID f/u 02-02-18 and was continued on IV anbx with HD for 6 weeks (end date 04-06-18) He had ID f/u on 03-28-18 and was felt to be doing well.   He returned to hospital on 5-15 with sepsis, from his L foot. States he had "pus pockets" that the ED MD lanced. 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 Wound Cx MRSA (Vanco MIC 0.5, S-Doxy, R- bactrim). He was seen by ID and continued on vancomycin post d/c (end date 06-21-18). He had ortho f/u (no plans for further intervention) and vascular who did a PTCA and stent L tibial artery.    He has continued to f/u at [facility]. He is planning to get a cast placed on his foot.  Had ortho f/u, FP f/u.  FSG have been "pretty good". Last A1C 7.3% (05-2018). No f/c.  Relays that his "blood count is 14" from charge nurse at HD. Query if this is vanco level.  Has dental work scheduled.   Review of Systems  Constitutional: Negative for appetite change, chills and fever.  Gastrointestinal: Negative for constipation and diarrhea.  Skin: Positive for wound.  Please see HPI. All other systems reviewed and negative.      Objective:   Physical Exam  Constitutional: He is oriented to person, place, and time. He appears well-developed and well-nourished.    HENT:  Mouth/Throat: Abnormal dentition. No oropharyngeal exudate.  Eyes: Pupils are equal, round, and reactive to light. EOM are normal.  Neck: Normal range of motion. Neck supple.  Cardiovascular: Normal rate, regular rhythm and normal heart sounds.  Pulses:      Posterior tibial pulses are 3+ on the left side.  Pulmonary/Chest: Effort normal and breath sounds normal.  Abdominal: Soft. Bowel sounds are normal. He exhibits no distension. There is no tenderness.  Musculoskeletal: Normal range of motion.       Feet:  Lymphadenopathy:    He has no cervical adenopathy.  Neurological: He is alert and oriented to person, place, and time.  Skin:                Assessment & Plan:

## 2018-06-02 NOTE — Assessment & Plan Note (Addendum)
Appreciate renal f/u AVF is well fxn, clean.  Prev on TXP list at [facility].

## 2018-06-02 NOTE — Assessment & Plan Note (Signed)
He has good pulse in L PT Hopefully wounds will heal.

## 2018-06-02 NOTE — Telephone Encounter (Signed)
Called and spoke to nurse at [facility] and she will fax me patient's labs from 5/22 until today. Will put in Dr. Algis Downs box.

## 2018-06-02 NOTE — Assessment & Plan Note (Signed)
Appreciate PCP f/u.  Needs a little better control.

## 2018-06-03 NOTE — Telephone Encounter (Signed)
Thanks

## 2018-06-05 DIAGNOSIS — E1121 Type 2 diabetes mellitus with diabetic nephropathy: Secondary | ICD-10-CM | POA: Diagnosis not present

## 2018-06-05 DIAGNOSIS — N186 End stage renal disease: Secondary | ICD-10-CM | POA: Diagnosis not present

## 2018-06-05 DIAGNOSIS — Z992 Dependence on renal dialysis: Secondary | ICD-10-CM | POA: Diagnosis not present

## 2018-06-05 DIAGNOSIS — N2581 Secondary hyperparathyroidism of renal origin: Secondary | ICD-10-CM | POA: Diagnosis not present

## 2018-06-05 DIAGNOSIS — M869 Osteomyelitis, unspecified: Secondary | ICD-10-CM | POA: Diagnosis not present

## 2018-06-06 ENCOUNTER — Other Ambulatory Visit (HOSPITAL_COMMUNITY)
Admission: RE | Admit: 2018-06-06 | Discharge: 2018-06-06 | Disposition: A | Discharge status: Home / Self Care | Payer: Medicare Other | Source: Other Acute Inpatient Hospital | Attending: Internal Medicine | Admitting: Internal Medicine

## 2018-06-06 DIAGNOSIS — E11621 Type 2 diabetes mellitus with foot ulcer: Secondary | ICD-10-CM | POA: Diagnosis not present

## 2018-06-06 DIAGNOSIS — L97523 Non-pressure chronic ulcer of other part of left foot with necrosis of muscle: Secondary | ICD-10-CM | POA: Diagnosis not present

## 2018-06-06 DIAGNOSIS — L97522 Non-pressure chronic ulcer of other part of left foot with fat layer exposed: Secondary | ICD-10-CM | POA: Diagnosis not present

## 2018-06-06 DIAGNOSIS — L97422 Non-pressure chronic ulcer of left heel and midfoot with fat layer exposed: Secondary | ICD-10-CM | POA: Diagnosis not present

## 2018-06-06 DIAGNOSIS — L97525 Non-pressure chronic ulcer of other part of left foot with muscle involvement without evidence of necrosis: Secondary | ICD-10-CM | POA: Diagnosis not present

## 2018-06-06 DIAGNOSIS — L97512 Non-pressure chronic ulcer of other part of right foot with fat layer exposed: Secondary | ICD-10-CM | POA: Diagnosis not present

## 2018-06-06 DIAGNOSIS — L02612 Cutaneous abscess of left foot: Secondary | ICD-10-CM | POA: Diagnosis not present

## 2018-06-07 ENCOUNTER — Encounter: Payer: Self-pay | Admitting: Vascular Surgery

## 2018-06-07 ENCOUNTER — Ambulatory Visit (INDEPENDENT_AMBULATORY_CARE_PROVIDER_SITE_OTHER): Payer: Medicare Other | Admitting: Vascular Surgery

## 2018-06-07 ENCOUNTER — Other Ambulatory Visit: Payer: Self-pay

## 2018-06-07 VITALS — BP 171/90 | HR 83 | Temp 97.8°F | Resp 20 | Ht 73.0 in | Wt 216.3 lb

## 2018-06-07 DIAGNOSIS — N186 End stage renal disease: Secondary | ICD-10-CM | POA: Diagnosis not present

## 2018-06-07 DIAGNOSIS — I739 Peripheral vascular disease, unspecified: Secondary | ICD-10-CM

## 2018-06-07 DIAGNOSIS — M869 Osteomyelitis, unspecified: Secondary | ICD-10-CM | POA: Diagnosis not present

## 2018-06-07 DIAGNOSIS — E11621 Type 2 diabetes mellitus with foot ulcer: Secondary | ICD-10-CM | POA: Diagnosis not present

## 2018-06-07 DIAGNOSIS — E1121 Type 2 diabetes mellitus with diabetic nephropathy: Secondary | ICD-10-CM | POA: Diagnosis not present

## 2018-06-07 DIAGNOSIS — N2581 Secondary hyperparathyroidism of renal origin: Secondary | ICD-10-CM | POA: Diagnosis not present

## 2018-06-07 DIAGNOSIS — L97508 Non-pressure chronic ulcer of other part of unspecified foot with other specified severity: Secondary | ICD-10-CM | POA: Diagnosis not present

## 2018-06-07 DIAGNOSIS — Z992 Dependence on renal dialysis: Secondary | ICD-10-CM | POA: Diagnosis not present

## 2018-06-07 NOTE — Progress Notes (Signed)
Patient name: [name]. MRN: [id number] DOB: 11-Sep-1951 Sex: male  REASON FOR VISIT:   Follow-up after revascularization.  HPI:   [name]. is a pleasant 67 y.o. male who underwent angioplasty and stenting of the left tibial peroneal trunk and posterior tibial artery angioplasty by Dr. Adele Barthel on 05/11/2018.  This was for a nonhealing wound on the left foot which is followed by the wound care center.  The patient states that the wounds on the right foot are improving.  He did have some drainage from the wound on the left foot where he has a callus.  These wounds are followed at the wound care center.  From a vascular standpoint I do not get any clear-cut history of claudication or rest pain.  He is not a smoker.  He is on Plavix and is on a statin.  Current Outpatient Medications  Medication Sig Dispense Refill  . acetaminophen (TYLENOL) 325 MG tablet Take 650 mg by mouth every 6 (six) hours as needed (pain).     Marland Kitchen atorvastatin (LIPITOR) 20 MG tablet Take 20 mg by mouth at bedtime.     . clopidogrel (PLAVIX) 75 MG tablet Take 1 tablet (75 mg total) by mouth daily. 30 tablet 11  . [START ON 06/22/2018] doxycycline (VIBRA-TABS) 100 MG tablet Take 1 tablet (100 mg total) by mouth 2 (two) times daily. 180 tablet 1  . ferric citrate (AURYXIA) 1 GM 210 MG(Fe) tablet Take 420 mg by mouth 3 (three) times daily with meals.     Marland Kitchen glucose monitoring kit (FREESTYLE) monitoring kit 1 each by Does not apply route 4 (four) times daily - after meals and at bedtime. 1 month Diabetic Testing Supplies for QAC-QHS accuchecks.Any brand OK 1 each 1  . HYDROcodone-acetaminophen (NORCO/VICODIN) 5-325 MG tablet Take 1 tablet by mouth every 6 (six) hours as needed for moderate pain. 30 tablet 0  . ibuprofen (ADVIL,MOTRIN) 200 MG tablet Take 400 mg by mouth as needed for moderate pain.    Marland Kitchen insulin NPH-regular Human (NOVOLIN 70/30) (70-30) 100 UNIT/ML injection Inject 1-5 Units into the skin 2  (two) times daily with a meal. Sliding Scale    . Insulin Syringe-Needle U-100 25G X 1" 1 ML MISC Any brand, for 4 times a day insulin SQ, 1 month supply. 30 each 0  . Multiple Vitamins-Minerals (MULTIVITAMIN WITH MINERALS) tablet Take 1 tablet by mouth daily.    . mupirocin ointment (BACTROBAN) 2 % Apply 1 application topically daily as needed (rash on stomach).   0  . primidone (MYSOLINE) 50 MG tablet Take 0.5 tablets (25 mg total) by mouth at bedtime. 30 tablet 0  . timolol (TIMOPTIC) 0.5 % ophthalmic solution Place 1 drop into the left eye 2 (two) times daily.    . vancomycin (VANCOCIN) 1-5 GM/200ML-% SOLN Inject 200 mLs (1,000 mg total) into the vein every Monday, Wednesday, and Friday with hemodialysis for 17 doses. 3400 mL 0  . vitamin C (ASCORBIC ACID) 500 MG tablet Take 500 mg by mouth 2 (two) times a week.     No current facility-administered medications for this visit.     REVIEW OF SYSTEMS:  '[X]'  denotes positive finding, '[ ]'  denotes negative finding Cardiac  Comments:  Chest pain or chest pressure:    Shortness of breath upon exertion:    Short of breath when lying flat:    Irregular heart rhythm:    Constitutional    Fever or chills:  PHYSICAL EXAM:   Vitals:   06/07/18 0839 06/07/18 0841  BP: (!) 176/91 (!) 171/90  Pulse: 83   Resp: 20   Temp: 97.8 F (36.6 C)   TempSrc: Oral   SpO2: 99%   Weight: 216 lb 4.8 oz (98.1 kg)   Height: '6\' 1"'  (1.854 m)     GENERAL: The patient is a well-nourished male, in no acute distress. The vital signs are documented above. CARDIOVASCULAR: There is a regular rate and rhythm. PULMONARY: There is good air exchange bilaterally without wheezing or rales. VASCULAR: On the left side, he has a biphasic posterior tibial signal and biphasic anterior tibial signal. On the right side he has a biphasic posterior tibial signal and a monophasic anterior tibial signal.  DATA:   No new data.  MEDICAL ISSUES:   STATUS POST TIBIAL  ANGIOPLASTY AND STENTING: The patient is undergone successful angioplasty and stenting of the left tibial peroneal trunk by Dr. Adele Barthel.  He has excellent blood flow in the left foot and I think the circulation is as good as we can get it.  He is on Plavix and is on a statin.  He is not a smoker.  His wounds are followed in the wound care center.  I have ordered a duplex of his left lower extremity and ABIs in 3 months and I will see him back at that time.  Of note, when he had his arteriogram the right leg was not studied.  If he has problems with healing the right leg he would need arteriography to assess the right side.  HYPERTENSION: The patient's initial blood pressure today was elevated. We repeated this and this was still elevated. We have encouraged the patient to follow up with their primary care physician for management of their blood pressure.   Deitra Mayo [facility] [phone]

## 2018-06-09 DIAGNOSIS — N2581 Secondary hyperparathyroidism of renal origin: Secondary | ICD-10-CM | POA: Diagnosis not present

## 2018-06-09 DIAGNOSIS — Z992 Dependence on renal dialysis: Secondary | ICD-10-CM | POA: Diagnosis not present

## 2018-06-09 DIAGNOSIS — E1121 Type 2 diabetes mellitus with diabetic nephropathy: Secondary | ICD-10-CM | POA: Diagnosis not present

## 2018-06-09 DIAGNOSIS — M869 Osteomyelitis, unspecified: Secondary | ICD-10-CM | POA: Diagnosis not present

## 2018-06-09 DIAGNOSIS — N186 End stage renal disease: Secondary | ICD-10-CM | POA: Diagnosis not present

## 2018-06-09 LAB — AEROBIC CULTURE  (SUPERFICIAL SPECIMEN)

## 2018-06-09 LAB — AEROBIC CULTURE W GRAM STAIN (SUPERFICIAL SPECIMEN)

## 2018-06-12 DIAGNOSIS — N2581 Secondary hyperparathyroidism of renal origin: Secondary | ICD-10-CM | POA: Diagnosis not present

## 2018-06-12 DIAGNOSIS — M869 Osteomyelitis, unspecified: Secondary | ICD-10-CM | POA: Diagnosis not present

## 2018-06-12 DIAGNOSIS — Z992 Dependence on renal dialysis: Secondary | ICD-10-CM | POA: Diagnosis not present

## 2018-06-12 DIAGNOSIS — E1121 Type 2 diabetes mellitus with diabetic nephropathy: Secondary | ICD-10-CM | POA: Diagnosis not present

## 2018-06-12 DIAGNOSIS — N186 End stage renal disease: Secondary | ICD-10-CM | POA: Diagnosis not present

## 2018-06-13 DIAGNOSIS — L97525 Non-pressure chronic ulcer of other part of left foot with muscle involvement without evidence of necrosis: Secondary | ICD-10-CM | POA: Diagnosis not present

## 2018-06-13 DIAGNOSIS — L97422 Non-pressure chronic ulcer of left heel and midfoot with fat layer exposed: Secondary | ICD-10-CM | POA: Diagnosis not present

## 2018-06-13 DIAGNOSIS — L97512 Non-pressure chronic ulcer of other part of right foot with fat layer exposed: Secondary | ICD-10-CM | POA: Diagnosis not present

## 2018-06-13 DIAGNOSIS — L02612 Cutaneous abscess of left foot: Secondary | ICD-10-CM | POA: Diagnosis not present

## 2018-06-13 DIAGNOSIS — L97522 Non-pressure chronic ulcer of other part of left foot with fat layer exposed: Secondary | ICD-10-CM | POA: Diagnosis not present

## 2018-06-13 DIAGNOSIS — E11621 Type 2 diabetes mellitus with foot ulcer: Secondary | ICD-10-CM | POA: Diagnosis not present

## 2018-06-14 DIAGNOSIS — M869 Osteomyelitis, unspecified: Secondary | ICD-10-CM | POA: Diagnosis not present

## 2018-06-14 DIAGNOSIS — N186 End stage renal disease: Secondary | ICD-10-CM | POA: Diagnosis not present

## 2018-06-14 DIAGNOSIS — E1121 Type 2 diabetes mellitus with diabetic nephropathy: Secondary | ICD-10-CM | POA: Diagnosis not present

## 2018-06-14 DIAGNOSIS — N2581 Secondary hyperparathyroidism of renal origin: Secondary | ICD-10-CM | POA: Diagnosis not present

## 2018-06-14 DIAGNOSIS — Z992 Dependence on renal dialysis: Secondary | ICD-10-CM | POA: Diagnosis not present

## 2018-06-16 DIAGNOSIS — E1121 Type 2 diabetes mellitus with diabetic nephropathy: Secondary | ICD-10-CM | POA: Diagnosis not present

## 2018-06-16 DIAGNOSIS — M869 Osteomyelitis, unspecified: Secondary | ICD-10-CM | POA: Diagnosis not present

## 2018-06-16 DIAGNOSIS — Z992 Dependence on renal dialysis: Secondary | ICD-10-CM | POA: Diagnosis not present

## 2018-06-16 DIAGNOSIS — N186 End stage renal disease: Secondary | ICD-10-CM | POA: Diagnosis not present

## 2018-06-16 DIAGNOSIS — N2581 Secondary hyperparathyroidism of renal origin: Secondary | ICD-10-CM | POA: Diagnosis not present

## 2018-06-19 DIAGNOSIS — N186 End stage renal disease: Secondary | ICD-10-CM | POA: Diagnosis not present

## 2018-06-19 DIAGNOSIS — E1121 Type 2 diabetes mellitus with diabetic nephropathy: Secondary | ICD-10-CM | POA: Diagnosis not present

## 2018-06-19 DIAGNOSIS — N2581 Secondary hyperparathyroidism of renal origin: Secondary | ICD-10-CM | POA: Diagnosis not present

## 2018-06-19 DIAGNOSIS — Z992 Dependence on renal dialysis: Secondary | ICD-10-CM | POA: Diagnosis not present

## 2018-06-19 DIAGNOSIS — M869 Osteomyelitis, unspecified: Secondary | ICD-10-CM | POA: Diagnosis not present

## 2018-06-20 ENCOUNTER — Ambulatory Visit: Payer: Medicare Other | Admitting: Infectious Diseases

## 2018-06-20 ENCOUNTER — Telehealth: Payer: Self-pay | Admitting: *Deleted

## 2018-06-20 ENCOUNTER — Other Ambulatory Visit: Payer: Self-pay | Admitting: Behavioral Health

## 2018-06-20 DIAGNOSIS — L97522 Non-pressure chronic ulcer of other part of left foot with fat layer exposed: Secondary | ICD-10-CM | POA: Diagnosis not present

## 2018-06-20 DIAGNOSIS — L97512 Non-pressure chronic ulcer of other part of right foot with fat layer exposed: Secondary | ICD-10-CM | POA: Diagnosis not present

## 2018-06-20 DIAGNOSIS — E11621 Type 2 diabetes mellitus with foot ulcer: Secondary | ICD-10-CM | POA: Diagnosis not present

## 2018-06-20 DIAGNOSIS — L02612 Cutaneous abscess of left foot: Secondary | ICD-10-CM | POA: Diagnosis not present

## 2018-06-20 DIAGNOSIS — L97526 Non-pressure chronic ulcer of other part of left foot with bone involvement without evidence of necrosis: Secondary | ICD-10-CM | POA: Diagnosis not present

## 2018-06-20 DIAGNOSIS — L97422 Non-pressure chronic ulcer of left heel and midfoot with fat layer exposed: Secondary | ICD-10-CM | POA: Diagnosis not present

## 2018-06-20 DIAGNOSIS — L97525 Non-pressure chronic ulcer of other part of left foot with muscle involvement without evidence of necrosis: Secondary | ICD-10-CM | POA: Diagnosis not present

## 2018-06-20 DIAGNOSIS — M86172 Other acute osteomyelitis, left ankle and foot: Secondary | ICD-10-CM

## 2018-06-20 MED ORDER — DOXYCYCLINE HYCLATE 100 MG PO TABS: 100.0000 mg | ORAL_TABLET | Freq: Two times a day (BID) | ORAL | 6 refills | Status: DC

## 2018-06-20 NOTE — Telephone Encounter (Signed)
Patient called to advise that the Doxy he was prescribed to start after IV medication is going to cost him $135 and he can not afford to get it and wants to know if there is something different he can get. Advised will call him back once I speak with the provider.

## 2018-06-20 NOTE — Telephone Encounter (Signed)
Can you ask pharm if we can get this here, should be on $4 list.... thanks

## 2018-06-21 ENCOUNTER — Other Ambulatory Visit: Payer: Self-pay | Admitting: Infectious Diseases

## 2018-06-21 DIAGNOSIS — Z992 Dependence on renal dialysis: Secondary | ICD-10-CM | POA: Diagnosis not present

## 2018-06-21 DIAGNOSIS — N2581 Secondary hyperparathyroidism of renal origin: Secondary | ICD-10-CM | POA: Diagnosis not present

## 2018-06-21 DIAGNOSIS — N186 End stage renal disease: Secondary | ICD-10-CM | POA: Diagnosis not present

## 2018-06-21 DIAGNOSIS — M869 Osteomyelitis, unspecified: Secondary | ICD-10-CM | POA: Diagnosis not present

## 2018-06-21 DIAGNOSIS — E1121 Type 2 diabetes mellitus with diabetic nephropathy: Secondary | ICD-10-CM | POA: Diagnosis not present

## 2018-06-21 DIAGNOSIS — M86172 Other acute osteomyelitis, left ankle and foot: Secondary | ICD-10-CM

## 2018-06-21 MED ORDER — DOXYCYCLINE MONOHYDRATE 100 MG PO CAPS: 100.0000 mg | ORAL_CAPSULE | Freq: Two times a day (BID) | ORAL | 1 refills | Status: DC

## 2018-06-21 NOTE — Telephone Encounter (Signed)
Patient called stating his pharmacy told him that Doxycycline Monohydrate would be cheaper and to ask Dr. [name] if this was a possibility or a cheaper alternative.  Original rx was written for a 90 day supply and patient states may be able to afford a 30 day supply at the moment but wanted to see if there was a cheaper medication first.  Pricilla Riffle RN

## 2018-06-21 NOTE — Telephone Encounter (Signed)
Called patient and informed him of medication change.  He verbalized understanding.  Verified his last date of Vancomycin was today at his Dialysis treatment. Pricilla Riffle RN

## 2018-06-21 NOTE — Telephone Encounter (Signed)
rx changed to doxy monohydrate

## 2018-06-24 DIAGNOSIS — Z992 Dependence on renal dialysis: Secondary | ICD-10-CM | POA: Diagnosis not present

## 2018-06-24 DIAGNOSIS — N186 End stage renal disease: Secondary | ICD-10-CM | POA: Diagnosis not present

## 2018-06-24 DIAGNOSIS — N2581 Secondary hyperparathyroidism of renal origin: Secondary | ICD-10-CM | POA: Diagnosis not present

## 2018-06-24 DIAGNOSIS — M869 Osteomyelitis, unspecified: Secondary | ICD-10-CM | POA: Diagnosis not present

## 2018-06-24 DIAGNOSIS — E1121 Type 2 diabetes mellitus with diabetic nephropathy: Secondary | ICD-10-CM | POA: Diagnosis not present

## 2018-06-26 DIAGNOSIS — E1121 Type 2 diabetes mellitus with diabetic nephropathy: Secondary | ICD-10-CM | POA: Diagnosis not present

## 2018-06-26 DIAGNOSIS — M868X7 Other osteomyelitis, ankle and foot: Secondary | ICD-10-CM | POA: Diagnosis not present

## 2018-06-26 DIAGNOSIS — I132 Hypertensive heart and chronic kidney disease with heart failure and with stage 5 chronic kidney disease, or end stage renal disease: Secondary | ICD-10-CM | POA: Diagnosis not present

## 2018-06-26 DIAGNOSIS — D509 Iron deficiency anemia, unspecified: Secondary | ICD-10-CM | POA: Diagnosis not present

## 2018-06-26 DIAGNOSIS — M869 Osteomyelitis, unspecified: Secondary | ICD-10-CM | POA: Diagnosis not present

## 2018-06-26 DIAGNOSIS — N185 Chronic kidney disease, stage 5: Secondary | ICD-10-CM | POA: Diagnosis not present

## 2018-06-26 DIAGNOSIS — N186 End stage renal disease: Secondary | ICD-10-CM | POA: Diagnosis not present

## 2018-06-26 DIAGNOSIS — E1122 Type 2 diabetes mellitus with diabetic chronic kidney disease: Secondary | ICD-10-CM | POA: Diagnosis not present

## 2018-06-26 DIAGNOSIS — Z992 Dependence on renal dialysis: Secondary | ICD-10-CM | POA: Diagnosis not present

## 2018-06-26 DIAGNOSIS — N2581 Secondary hyperparathyroidism of renal origin: Secondary | ICD-10-CM | POA: Diagnosis not present

## 2018-06-26 DIAGNOSIS — L97526 Non-pressure chronic ulcer of other part of left foot with bone involvement without evidence of necrosis: Secondary | ICD-10-CM | POA: Diagnosis not present

## 2018-06-26 DIAGNOSIS — E11621 Type 2 diabetes mellitus with foot ulcer: Secondary | ICD-10-CM | POA: Diagnosis not present

## 2018-06-26 DIAGNOSIS — L97512 Non-pressure chronic ulcer of other part of right foot with fat layer exposed: Secondary | ICD-10-CM | POA: Diagnosis not present

## 2018-06-27 ENCOUNTER — Encounter (HOSPITAL_BASED_OUTPATIENT_CLINIC_OR_DEPARTMENT_OTHER): Payer: Medicare Other | Attending: Internal Medicine

## 2018-06-27 DIAGNOSIS — I132 Hypertensive heart and chronic kidney disease with heart failure and with stage 5 chronic kidney disease, or end stage renal disease: Secondary | ICD-10-CM | POA: Diagnosis not present

## 2018-06-27 DIAGNOSIS — B9689 Other specified bacterial agents as the cause of diseases classified elsewhere: Secondary | ICD-10-CM | POA: Diagnosis not present

## 2018-06-27 DIAGNOSIS — E1140 Type 2 diabetes mellitus with diabetic neuropathy, unspecified: Secondary | ICD-10-CM | POA: Insufficient documentation

## 2018-06-27 DIAGNOSIS — N185 Chronic kidney disease, stage 5: Secondary | ICD-10-CM | POA: Insufficient documentation

## 2018-06-27 DIAGNOSIS — E1169 Type 2 diabetes mellitus with other specified complication: Secondary | ICD-10-CM | POA: Insufficient documentation

## 2018-06-27 DIAGNOSIS — E1122 Type 2 diabetes mellitus with diabetic chronic kidney disease: Secondary | ICD-10-CM | POA: Diagnosis not present

## 2018-06-27 DIAGNOSIS — B961 Klebsiella pneumoniae [K. pneumoniae] as the cause of diseases classified elsewhere: Secondary | ICD-10-CM | POA: Diagnosis not present

## 2018-06-27 DIAGNOSIS — L97526 Non-pressure chronic ulcer of other part of left foot with bone involvement without evidence of necrosis: Secondary | ICD-10-CM | POA: Insufficient documentation

## 2018-06-27 DIAGNOSIS — L97512 Non-pressure chronic ulcer of other part of right foot with fat layer exposed: Secondary | ICD-10-CM | POA: Insufficient documentation

## 2018-06-27 DIAGNOSIS — I509 Heart failure, unspecified: Secondary | ICD-10-CM | POA: Insufficient documentation

## 2018-06-27 DIAGNOSIS — E1151 Type 2 diabetes mellitus with diabetic peripheral angiopathy without gangrene: Secondary | ICD-10-CM | POA: Insufficient documentation

## 2018-06-27 DIAGNOSIS — Z992 Dependence on renal dialysis: Secondary | ICD-10-CM | POA: Diagnosis not present

## 2018-06-27 DIAGNOSIS — L84 Corns and callosities: Secondary | ICD-10-CM | POA: Diagnosis not present

## 2018-06-27 DIAGNOSIS — M86372 Chronic multifocal osteomyelitis, left ankle and foot: Secondary | ICD-10-CM | POA: Diagnosis not present

## 2018-06-27 DIAGNOSIS — E11621 Type 2 diabetes mellitus with foot ulcer: Secondary | ICD-10-CM | POA: Diagnosis not present

## 2018-06-28 ENCOUNTER — Encounter: Payer: Self-pay | Admitting: Infectious Diseases

## 2018-06-28 DIAGNOSIS — Z992 Dependence on renal dialysis: Secondary | ICD-10-CM | POA: Diagnosis not present

## 2018-06-28 DIAGNOSIS — N2581 Secondary hyperparathyroidism of renal origin: Secondary | ICD-10-CM | POA: Diagnosis not present

## 2018-06-28 DIAGNOSIS — M868X7 Other osteomyelitis, ankle and foot: Secondary | ICD-10-CM | POA: Diagnosis not present

## 2018-06-28 DIAGNOSIS — N186 End stage renal disease: Secondary | ICD-10-CM | POA: Diagnosis not present

## 2018-06-28 DIAGNOSIS — E1121 Type 2 diabetes mellitus with diabetic nephropathy: Secondary | ICD-10-CM | POA: Diagnosis not present

## 2018-06-28 DIAGNOSIS — D509 Iron deficiency anemia, unspecified: Secondary | ICD-10-CM | POA: Diagnosis not present

## 2018-06-30 DIAGNOSIS — E1121 Type 2 diabetes mellitus with diabetic nephropathy: Secondary | ICD-10-CM | POA: Diagnosis not present

## 2018-06-30 DIAGNOSIS — N2581 Secondary hyperparathyroidism of renal origin: Secondary | ICD-10-CM | POA: Diagnosis not present

## 2018-06-30 DIAGNOSIS — Z992 Dependence on renal dialysis: Secondary | ICD-10-CM | POA: Diagnosis not present

## 2018-06-30 DIAGNOSIS — D509 Iron deficiency anemia, unspecified: Secondary | ICD-10-CM | POA: Diagnosis not present

## 2018-06-30 DIAGNOSIS — N186 End stage renal disease: Secondary | ICD-10-CM | POA: Diagnosis not present

## 2018-06-30 DIAGNOSIS — M868X7 Other osteomyelitis, ankle and foot: Secondary | ICD-10-CM | POA: Diagnosis not present

## 2018-07-03 ENCOUNTER — Telehealth: Payer: Self-pay | Admitting: *Deleted

## 2018-07-03 ENCOUNTER — Ambulatory Visit: Payer: Medicare Other | Admitting: Infectious Diseases

## 2018-07-03 ENCOUNTER — Encounter: Payer: Self-pay | Admitting: Infectious Diseases

## 2018-07-03 DIAGNOSIS — M868X7 Other osteomyelitis, ankle and foot: Secondary | ICD-10-CM | POA: Diagnosis not present

## 2018-07-03 DIAGNOSIS — N186 End stage renal disease: Secondary | ICD-10-CM | POA: Diagnosis not present

## 2018-07-03 DIAGNOSIS — Z992 Dependence on renal dialysis: Secondary | ICD-10-CM | POA: Diagnosis not present

## 2018-07-03 DIAGNOSIS — D509 Iron deficiency anemia, unspecified: Secondary | ICD-10-CM | POA: Diagnosis not present

## 2018-07-03 DIAGNOSIS — E1121 Type 2 diabetes mellitus with diabetic nephropathy: Secondary | ICD-10-CM | POA: Diagnosis not present

## 2018-07-03 DIAGNOSIS — M869 Osteomyelitis, unspecified: Secondary | ICD-10-CM | POA: Diagnosis not present

## 2018-07-03 DIAGNOSIS — N2581 Secondary hyperparathyroidism of renal origin: Secondary | ICD-10-CM | POA: Diagnosis not present

## 2018-07-03 NOTE — Telephone Encounter (Signed)
Patient unable to keep appointment with Dr [name] today. He is seeing wound care tomorrow 7/9, may have his wounds casted - unable to be seen at [facility] before this happens, as he also has dialysis today.  Per Dr [name], requested office notes/pictures from visit tomorrow to be sent to Dr Algis Downs attention.  Per Dr [name], sent lab orders to [facility] for CBC, CMP, ESR, CRP to be drawn today, faxed to [phone].  Scheduled patient to follow up with Dr [name] Thursday 8/1 at 10. He will continue doxycycline until then. Landis Gandy, RN

## 2018-07-04 DIAGNOSIS — E11621 Type 2 diabetes mellitus with foot ulcer: Secondary | ICD-10-CM | POA: Diagnosis not present

## 2018-07-04 DIAGNOSIS — N185 Chronic kidney disease, stage 5: Secondary | ICD-10-CM | POA: Diagnosis not present

## 2018-07-04 DIAGNOSIS — I132 Hypertensive heart and chronic kidney disease with heart failure and with stage 5 chronic kidney disease, or end stage renal disease: Secondary | ICD-10-CM | POA: Diagnosis not present

## 2018-07-04 DIAGNOSIS — L97526 Non-pressure chronic ulcer of other part of left foot with bone involvement without evidence of necrosis: Secondary | ICD-10-CM | POA: Diagnosis not present

## 2018-07-04 DIAGNOSIS — L97512 Non-pressure chronic ulcer of other part of right foot with fat layer exposed: Secondary | ICD-10-CM | POA: Diagnosis not present

## 2018-07-04 DIAGNOSIS — E1122 Type 2 diabetes mellitus with diabetic chronic kidney disease: Secondary | ICD-10-CM | POA: Diagnosis not present

## 2018-07-05 ENCOUNTER — Encounter: Payer: Self-pay | Admitting: Infectious Diseases

## 2018-07-05 DIAGNOSIS — N2581 Secondary hyperparathyroidism of renal origin: Secondary | ICD-10-CM | POA: Diagnosis not present

## 2018-07-05 DIAGNOSIS — D509 Iron deficiency anemia, unspecified: Secondary | ICD-10-CM | POA: Diagnosis not present

## 2018-07-05 DIAGNOSIS — N186 End stage renal disease: Secondary | ICD-10-CM | POA: Diagnosis not present

## 2018-07-05 DIAGNOSIS — E1121 Type 2 diabetes mellitus with diabetic nephropathy: Secondary | ICD-10-CM | POA: Diagnosis not present

## 2018-07-05 DIAGNOSIS — M868X7 Other osteomyelitis, ankle and foot: Secondary | ICD-10-CM | POA: Diagnosis not present

## 2018-07-05 DIAGNOSIS — Z992 Dependence on renal dialysis: Secondary | ICD-10-CM | POA: Diagnosis not present

## 2018-07-07 DIAGNOSIS — E1121 Type 2 diabetes mellitus with diabetic nephropathy: Secondary | ICD-10-CM | POA: Diagnosis not present

## 2018-07-07 DIAGNOSIS — D509 Iron deficiency anemia, unspecified: Secondary | ICD-10-CM | POA: Diagnosis not present

## 2018-07-07 DIAGNOSIS — N2581 Secondary hyperparathyroidism of renal origin: Secondary | ICD-10-CM | POA: Diagnosis not present

## 2018-07-07 DIAGNOSIS — M868X7 Other osteomyelitis, ankle and foot: Secondary | ICD-10-CM | POA: Diagnosis not present

## 2018-07-07 DIAGNOSIS — Z992 Dependence on renal dialysis: Secondary | ICD-10-CM | POA: Diagnosis not present

## 2018-07-07 DIAGNOSIS — N186 End stage renal disease: Secondary | ICD-10-CM | POA: Diagnosis not present

## 2018-07-10 DIAGNOSIS — E1121 Type 2 diabetes mellitus with diabetic nephropathy: Secondary | ICD-10-CM | POA: Diagnosis not present

## 2018-07-10 DIAGNOSIS — N2581 Secondary hyperparathyroidism of renal origin: Secondary | ICD-10-CM | POA: Diagnosis not present

## 2018-07-10 DIAGNOSIS — D509 Iron deficiency anemia, unspecified: Secondary | ICD-10-CM | POA: Diagnosis not present

## 2018-07-10 DIAGNOSIS — Z992 Dependence on renal dialysis: Secondary | ICD-10-CM | POA: Diagnosis not present

## 2018-07-10 DIAGNOSIS — M868X7 Other osteomyelitis, ankle and foot: Secondary | ICD-10-CM | POA: Diagnosis not present

## 2018-07-10 DIAGNOSIS — N186 End stage renal disease: Secondary | ICD-10-CM | POA: Diagnosis not present

## 2018-07-11 DIAGNOSIS — L97512 Non-pressure chronic ulcer of other part of right foot with fat layer exposed: Secondary | ICD-10-CM | POA: Diagnosis not present

## 2018-07-11 DIAGNOSIS — L97522 Non-pressure chronic ulcer of other part of left foot with fat layer exposed: Secondary | ICD-10-CM | POA: Diagnosis not present

## 2018-07-11 DIAGNOSIS — I132 Hypertensive heart and chronic kidney disease with heart failure and with stage 5 chronic kidney disease, or end stage renal disease: Secondary | ICD-10-CM | POA: Diagnosis not present

## 2018-07-11 DIAGNOSIS — L97526 Non-pressure chronic ulcer of other part of left foot with bone involvement without evidence of necrosis: Secondary | ICD-10-CM | POA: Diagnosis not present

## 2018-07-11 DIAGNOSIS — E1122 Type 2 diabetes mellitus with diabetic chronic kidney disease: Secondary | ICD-10-CM | POA: Diagnosis not present

## 2018-07-11 DIAGNOSIS — N185 Chronic kidney disease, stage 5: Secondary | ICD-10-CM | POA: Diagnosis not present

## 2018-07-11 DIAGNOSIS — E11621 Type 2 diabetes mellitus with foot ulcer: Secondary | ICD-10-CM | POA: Diagnosis not present

## 2018-07-12 ENCOUNTER — Encounter: Payer: Self-pay | Admitting: Infectious Diseases

## 2018-07-12 DIAGNOSIS — M868X7 Other osteomyelitis, ankle and foot: Secondary | ICD-10-CM | POA: Diagnosis not present

## 2018-07-12 DIAGNOSIS — D509 Iron deficiency anemia, unspecified: Secondary | ICD-10-CM | POA: Diagnosis not present

## 2018-07-12 DIAGNOSIS — Z992 Dependence on renal dialysis: Secondary | ICD-10-CM | POA: Diagnosis not present

## 2018-07-12 DIAGNOSIS — E1121 Type 2 diabetes mellitus with diabetic nephropathy: Secondary | ICD-10-CM | POA: Diagnosis not present

## 2018-07-12 DIAGNOSIS — N2581 Secondary hyperparathyroidism of renal origin: Secondary | ICD-10-CM | POA: Diagnosis not present

## 2018-07-12 DIAGNOSIS — N186 End stage renal disease: Secondary | ICD-10-CM | POA: Diagnosis not present

## 2018-07-14 DIAGNOSIS — N2581 Secondary hyperparathyroidism of renal origin: Secondary | ICD-10-CM | POA: Diagnosis not present

## 2018-07-14 DIAGNOSIS — M868X7 Other osteomyelitis, ankle and foot: Secondary | ICD-10-CM | POA: Diagnosis not present

## 2018-07-14 DIAGNOSIS — D509 Iron deficiency anemia, unspecified: Secondary | ICD-10-CM | POA: Diagnosis not present

## 2018-07-14 DIAGNOSIS — Z992 Dependence on renal dialysis: Secondary | ICD-10-CM | POA: Diagnosis not present

## 2018-07-14 DIAGNOSIS — N186 End stage renal disease: Secondary | ICD-10-CM | POA: Diagnosis not present

## 2018-07-14 DIAGNOSIS — E1121 Type 2 diabetes mellitus with diabetic nephropathy: Secondary | ICD-10-CM | POA: Diagnosis not present

## 2018-07-17 DIAGNOSIS — N186 End stage renal disease: Secondary | ICD-10-CM | POA: Diagnosis not present

## 2018-07-17 DIAGNOSIS — M868X7 Other osteomyelitis, ankle and foot: Secondary | ICD-10-CM | POA: Diagnosis not present

## 2018-07-17 DIAGNOSIS — Z992 Dependence on renal dialysis: Secondary | ICD-10-CM | POA: Diagnosis not present

## 2018-07-17 DIAGNOSIS — D509 Iron deficiency anemia, unspecified: Secondary | ICD-10-CM | POA: Diagnosis not present

## 2018-07-17 DIAGNOSIS — N2581 Secondary hyperparathyroidism of renal origin: Secondary | ICD-10-CM | POA: Diagnosis not present

## 2018-07-17 DIAGNOSIS — E1121 Type 2 diabetes mellitus with diabetic nephropathy: Secondary | ICD-10-CM | POA: Diagnosis not present

## 2018-07-18 ENCOUNTER — Other Ambulatory Visit (HOSPITAL_COMMUNITY)
Admission: RE | Admit: 2018-07-18 | Discharge: 2018-07-18 | Disposition: A | Discharge status: Home / Self Care | Payer: Medicare Other | Source: Other Acute Inpatient Hospital | Attending: Internal Medicine | Admitting: Internal Medicine

## 2018-07-18 ENCOUNTER — Other Ambulatory Visit (HOSPITAL_BASED_OUTPATIENT_CLINIC_OR_DEPARTMENT_OTHER): Payer: Self-pay | Admitting: Internal Medicine

## 2018-07-18 DIAGNOSIS — L97512 Non-pressure chronic ulcer of other part of right foot with fat layer exposed: Secondary | ICD-10-CM | POA: Diagnosis not present

## 2018-07-18 DIAGNOSIS — E11621 Type 2 diabetes mellitus with foot ulcer: Secondary | ICD-10-CM | POA: Diagnosis not present

## 2018-07-18 DIAGNOSIS — I132 Hypertensive heart and chronic kidney disease with heart failure and with stage 5 chronic kidney disease, or end stage renal disease: Secondary | ICD-10-CM | POA: Diagnosis not present

## 2018-07-18 DIAGNOSIS — L97524 Non-pressure chronic ulcer of other part of left foot with necrosis of bone: Secondary | ICD-10-CM | POA: Diagnosis not present

## 2018-07-18 DIAGNOSIS — N185 Chronic kidney disease, stage 5: Secondary | ICD-10-CM | POA: Diagnosis not present

## 2018-07-18 DIAGNOSIS — L97522 Non-pressure chronic ulcer of other part of left foot with fat layer exposed: Secondary | ICD-10-CM | POA: Diagnosis not present

## 2018-07-18 DIAGNOSIS — M868X7 Other osteomyelitis, ankle and foot: Secondary | ICD-10-CM | POA: Diagnosis not present

## 2018-07-18 DIAGNOSIS — L97526 Non-pressure chronic ulcer of other part of left foot with bone involvement without evidence of necrosis: Secondary | ICD-10-CM | POA: Diagnosis not present

## 2018-07-18 DIAGNOSIS — E1122 Type 2 diabetes mellitus with diabetic chronic kidney disease: Secondary | ICD-10-CM | POA: Diagnosis not present

## 2018-07-19 ENCOUNTER — Encounter: Payer: Self-pay | Admitting: Infectious Diseases

## 2018-07-19 DIAGNOSIS — E1121 Type 2 diabetes mellitus with diabetic nephropathy: Secondary | ICD-10-CM | POA: Diagnosis not present

## 2018-07-19 DIAGNOSIS — M868X7 Other osteomyelitis, ankle and foot: Secondary | ICD-10-CM | POA: Diagnosis not present

## 2018-07-19 DIAGNOSIS — Z992 Dependence on renal dialysis: Secondary | ICD-10-CM | POA: Diagnosis not present

## 2018-07-19 DIAGNOSIS — N186 End stage renal disease: Secondary | ICD-10-CM | POA: Diagnosis not present

## 2018-07-19 DIAGNOSIS — N2581 Secondary hyperparathyroidism of renal origin: Secondary | ICD-10-CM | POA: Diagnosis not present

## 2018-07-19 DIAGNOSIS — D509 Iron deficiency anemia, unspecified: Secondary | ICD-10-CM | POA: Diagnosis not present

## 2018-07-21 DIAGNOSIS — N186 End stage renal disease: Secondary | ICD-10-CM | POA: Diagnosis not present

## 2018-07-21 DIAGNOSIS — D509 Iron deficiency anemia, unspecified: Secondary | ICD-10-CM | POA: Diagnosis not present

## 2018-07-21 DIAGNOSIS — E1121 Type 2 diabetes mellitus with diabetic nephropathy: Secondary | ICD-10-CM | POA: Diagnosis not present

## 2018-07-21 DIAGNOSIS — M868X7 Other osteomyelitis, ankle and foot: Secondary | ICD-10-CM | POA: Diagnosis not present

## 2018-07-21 DIAGNOSIS — N2581 Secondary hyperparathyroidism of renal origin: Secondary | ICD-10-CM | POA: Diagnosis not present

## 2018-07-21 DIAGNOSIS — Z992 Dependence on renal dialysis: Secondary | ICD-10-CM | POA: Diagnosis not present

## 2018-07-22 LAB — AEROBIC CULTURE W GRAM STAIN (SUPERFICIAL SPECIMEN)

## 2018-07-22 LAB — AEROBIC CULTURE  (SUPERFICIAL SPECIMEN)

## 2018-07-24 ENCOUNTER — Other Ambulatory Visit: Payer: Self-pay | Admitting: Infectious Diseases

## 2018-07-24 ENCOUNTER — Telehealth: Payer: Self-pay | Admitting: *Deleted

## 2018-07-24 DIAGNOSIS — N2581 Secondary hyperparathyroidism of renal origin: Secondary | ICD-10-CM | POA: Diagnosis not present

## 2018-07-24 DIAGNOSIS — Z992 Dependence on renal dialysis: Secondary | ICD-10-CM | POA: Diagnosis not present

## 2018-07-24 DIAGNOSIS — N186 End stage renal disease: Secondary | ICD-10-CM | POA: Diagnosis not present

## 2018-07-24 DIAGNOSIS — M868X7 Other osteomyelitis, ankle and foot: Secondary | ICD-10-CM | POA: Diagnosis not present

## 2018-07-24 DIAGNOSIS — M86671 Other chronic osteomyelitis, right ankle and foot: Secondary | ICD-10-CM

## 2018-07-24 DIAGNOSIS — D509 Iron deficiency anemia, unspecified: Secondary | ICD-10-CM | POA: Diagnosis not present

## 2018-07-24 DIAGNOSIS — E1121 Type 2 diabetes mellitus with diabetic nephropathy: Secondary | ICD-10-CM | POA: Diagnosis not present

## 2018-07-24 DIAGNOSIS — M869 Osteomyelitis, unspecified: Secondary | ICD-10-CM

## 2018-07-24 MED ORDER — CIPROFLOXACIN HCL 500 MG PO TABS
500.0000 mg | ORAL_TABLET | Freq: Every day | ORAL | 1 refills | Status: DC
Start: 2018-07-24 — End: 2018-07-27

## 2018-07-24 MED ORDER — CIPROFLOXACIN HCL 500 MG PO TABS: 500.0000 mg | ORAL_TABLET | Freq: Every day | ORAL | 1 refills | Status: DC

## 2018-07-24 NOTE — Telephone Encounter (Signed)
New rx entered for pt thanks

## 2018-07-24 NOTE — Telephone Encounter (Signed)
Relayed information to patient, asked him to stop doxycycline and start cipro 500mg  1 tablet daily. prescription resent electronically to [facility] on [address]. Landis Gandy, RN

## 2018-07-24 NOTE — Addendum Note (Signed)
Addended by: Landis Gandy on: 07/24/2018 04:45 PM   Modules accepted: Orders

## 2018-07-24 NOTE — Telephone Encounter (Signed)
Patient called, stated he had new cultures/sensitivity obtained, was told by Dr Dellia Nims that the antibiotic Dr [name] sent in previously will not work, asked if there was something else to take? He sees Dr [name] 8/1. Landis Gandy, RN

## 2018-07-25 DIAGNOSIS — I132 Hypertensive heart and chronic kidney disease with heart failure and with stage 5 chronic kidney disease, or end stage renal disease: Secondary | ICD-10-CM | POA: Diagnosis not present

## 2018-07-25 DIAGNOSIS — M86672 Other chronic osteomyelitis, left ankle and foot: Secondary | ICD-10-CM | POA: Diagnosis not present

## 2018-07-25 DIAGNOSIS — L97524 Non-pressure chronic ulcer of other part of left foot with necrosis of bone: Secondary | ICD-10-CM | POA: Diagnosis not present

## 2018-07-25 DIAGNOSIS — N185 Chronic kidney disease, stage 5: Secondary | ICD-10-CM | POA: Diagnosis not present

## 2018-07-25 DIAGNOSIS — L97526 Non-pressure chronic ulcer of other part of left foot with bone involvement without evidence of necrosis: Secondary | ICD-10-CM | POA: Diagnosis not present

## 2018-07-25 DIAGNOSIS — E1122 Type 2 diabetes mellitus with diabetic chronic kidney disease: Secondary | ICD-10-CM | POA: Diagnosis not present

## 2018-07-25 DIAGNOSIS — L97512 Non-pressure chronic ulcer of other part of right foot with fat layer exposed: Secondary | ICD-10-CM | POA: Diagnosis not present

## 2018-07-25 DIAGNOSIS — E11621 Type 2 diabetes mellitus with foot ulcer: Secondary | ICD-10-CM | POA: Diagnosis not present

## 2018-07-26 ENCOUNTER — Encounter: Payer: Self-pay | Admitting: Infectious Diseases

## 2018-07-26 DIAGNOSIS — M868X7 Other osteomyelitis, ankle and foot: Secondary | ICD-10-CM | POA: Diagnosis not present

## 2018-07-26 DIAGNOSIS — D509 Iron deficiency anemia, unspecified: Secondary | ICD-10-CM | POA: Diagnosis not present

## 2018-07-26 DIAGNOSIS — Z992 Dependence on renal dialysis: Secondary | ICD-10-CM | POA: Diagnosis not present

## 2018-07-26 DIAGNOSIS — E1121 Type 2 diabetes mellitus with diabetic nephropathy: Secondary | ICD-10-CM | POA: Diagnosis not present

## 2018-07-26 DIAGNOSIS — N186 End stage renal disease: Secondary | ICD-10-CM | POA: Diagnosis not present

## 2018-07-26 DIAGNOSIS — N2581 Secondary hyperparathyroidism of renal origin: Secondary | ICD-10-CM | POA: Diagnosis not present

## 2018-07-27 ENCOUNTER — Ambulatory Visit (INDEPENDENT_AMBULATORY_CARE_PROVIDER_SITE_OTHER): Payer: Medicare Other | Admitting: Infectious Diseases

## 2018-07-27 ENCOUNTER — Encounter: Payer: Self-pay | Admitting: Infectious Diseases

## 2018-07-27 ENCOUNTER — Telehealth: Payer: Self-pay

## 2018-07-27 DIAGNOSIS — N2581 Secondary hyperparathyroidism of renal origin: Secondary | ICD-10-CM | POA: Diagnosis not present

## 2018-07-27 DIAGNOSIS — Z992 Dependence on renal dialysis: Secondary | ICD-10-CM

## 2018-07-27 DIAGNOSIS — E1121 Type 2 diabetes mellitus with diabetic nephropathy: Secondary | ICD-10-CM | POA: Diagnosis not present

## 2018-07-27 DIAGNOSIS — M86172 Other acute osteomyelitis, left ankle and foot: Secondary | ICD-10-CM

## 2018-07-27 DIAGNOSIS — E1122 Type 2 diabetes mellitus with diabetic chronic kidney disease: Secondary | ICD-10-CM | POA: Diagnosis not present

## 2018-07-27 DIAGNOSIS — D509 Iron deficiency anemia, unspecified: Secondary | ICD-10-CM | POA: Diagnosis not present

## 2018-07-27 DIAGNOSIS — N186 End stage renal disease: Secondary | ICD-10-CM | POA: Diagnosis not present

## 2018-07-27 NOTE — Assessment & Plan Note (Addendum)
No problems with fistula.  Appreciate f/u with renal.

## 2018-07-27 NOTE — Telephone Encounter (Signed)
Called [facility] back after multiple attempts to speak with a nurse regarding starting pt back on antibiotics. Called [phone] to see if an alternative number could be given. Was able to speak to a representative of [facility] and informed them that the direct number listed for the facility has been busy since this morning and would need to speak to a nurse to inform them about recent changes in mediation for pt. Representative sent an e-mail to clinical manager to have someone reach out to our office within 24 hours of this call to take my message.  [place]

## 2018-07-27 NOTE — Progress Notes (Signed)
   Subjective:    Patient ID: [name] HINEMAN Sr., male    DOB: 1951-07-06, 67 y.o.   MRN: [id number]  HPI 67yo M with hx of DM2, ESRD, CAD, and ulcers on R first metatarsal head; L 5th and L 1st MT heads.  He was seen in ID on 5-21-18with worsening of his R 5th ray and was sent to hospital. His wound Cx was polymicrobial. He underwent R 5th ray amputation.   On 2-1-19he underwent(by Dr [name]) 1. 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 2. Excisional debridement of plantar wound. 3. Amputation of the left 2nd toe at the interphalangeal joint. 4. Excisional debridement of skin, subcutaneous tissue, and bone at the site of a complex wound, left foot.  He had ID f/u 02-02-18 and was continued on IV anbx with HD for 6 weeks (end date 04-06-18) He had ID f/u on 03-28-18 and was felt to be doing well.   He returned to hospital on 5-15 with sepsis, from his L foot. States he had "pus pockets" that the ED MD lanced. 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 Wound Cx MRSA (Vanco MIC 0.5, S-Doxy, R- bactrim). He was seen by ID and continued on vancomycin post d/c (end date 06-21-18). He had ortho f/u (no plans for further intervention) and vascular who did a PTCA and stent L tibial artery.    FSG have been "pretty good". Last A1C 7.3% (05-2018).  He was seen in [facility] and had bone bx that showed osteo in his L foot, necrotic/edematous soft tissue.  He had bone Cx that showed Enterobacter cloacae (R- ancef) and Klebsiella oxytoca (R-amp).  Has been having stinging pain in his foot. States that he has exposed bone underneath Ca+ alginate that is in wound.   Numbness in his L ankle. Has improved.   No problems with HD.   Review of Systems  Constitutional: Negative for chills and fever.  Gastrointestinal: Negative for constipation and diarrhea.  Genitourinary: Negative for difficulty urinating.  Please see HPI. All other systems reviewed and  negative.     Objective:   Physical Exam  Constitutional: He appears well-developed and well-nourished. No distress.     L foot (2nd photo) shows no d/c from wound. His alginate is partially removed. Clean. Non-tender, no increase in heat.  R foot (1st photo) shows a clean wound, no d/c, no odor, no tenderness.        Assessment & Plan:

## 2018-07-27 NOTE — Telephone Encounter (Signed)
Per DR. [name] called pt's dialysis center at [phone] for pt to begin antibiotics same day as his dialysis. Was unable to reach office nor leave a vm attempted alternative numbers as well with no luck in reaching someone to take a verbal order. Will attempt to call them at a later time. Antibiotics: Ceftazidime   Aundria Rud, CMA

## 2018-07-27 NOTE — Assessment & Plan Note (Addendum)
will give him 6 weeks of ceftaz with HD.  Will stop his Cipro (started after call to ID office).  He goes to [facility].  Will ask them to check his ESR and CRP.  He is worried about needing amputation, as am I.  He may need VAC, will defer to his surgery/WOC f/u.  Will see him back in 1 month 

## 2018-07-27 NOTE — Assessment & Plan Note (Signed)
Last A1C 7% per pt.  will continue encourage good control

## 2018-07-28 ENCOUNTER — Other Ambulatory Visit: Payer: Self-pay

## 2018-07-28 DIAGNOSIS — N186 End stage renal disease: Secondary | ICD-10-CM | POA: Diagnosis not present

## 2018-07-28 DIAGNOSIS — N2581 Secondary hyperparathyroidism of renal origin: Secondary | ICD-10-CM | POA: Diagnosis not present

## 2018-07-28 DIAGNOSIS — Z112 Encounter for screening for other bacterial diseases: Secondary | ICD-10-CM | POA: Diagnosis not present

## 2018-07-28 DIAGNOSIS — D631 Anemia in chronic kidney disease: Secondary | ICD-10-CM | POA: Diagnosis not present

## 2018-07-28 DIAGNOSIS — Z992 Dependence on renal dialysis: Secondary | ICD-10-CM | POA: Diagnosis not present

## 2018-07-28 DIAGNOSIS — D509 Iron deficiency anemia, unspecified: Secondary | ICD-10-CM | POA: Diagnosis not present

## 2018-07-28 DIAGNOSIS — E1121 Type 2 diabetes mellitus with diabetic nephropathy: Secondary | ICD-10-CM | POA: Diagnosis not present

## 2018-07-28 DIAGNOSIS — M86172 Other acute osteomyelitis, left ankle and foot: Secondary | ICD-10-CM | POA: Diagnosis not present

## 2018-07-28 NOTE — Telephone Encounter (Signed)
Called Angie Back at [facility] to give verbal order to begin antibiotics for pt while at dialysis. Angie was able to take a verbal order for pt to begin Ceftaz 1 gm confirmed by Dr. Tommy Medal. Also asked for pt to get SR and CRP per chart for MD as well. Angie stated since pt has began dialysis she will have pt begin antibiotics on Monday during his next session. Will update Dr. [name] of this call. Questions and orders can be called into [phone] to Angie. [place]

## 2018-07-28 NOTE — Telephone Encounter (Signed)
Spoke with Angie at [facility] stated phones have been down and is the reason we unable to reach her. Angie stated MD would need to send orders to their office for pt to begin antibiotics. Will ask MD to fill out an order form to send to [facility] so we can call it in, We can reach Angie at [phone] once orders have been sent to [place].  Angie needs: Dosage of antibiotic, duration of antibiotics, any labs that need to be drawn Aundria Rud, [place]

## 2018-07-31 DIAGNOSIS — M86172 Other acute osteomyelitis, left ankle and foot: Secondary | ICD-10-CM | POA: Diagnosis not present

## 2018-07-31 DIAGNOSIS — D509 Iron deficiency anemia, unspecified: Secondary | ICD-10-CM | POA: Diagnosis not present

## 2018-07-31 DIAGNOSIS — N186 End stage renal disease: Secondary | ICD-10-CM | POA: Diagnosis not present

## 2018-07-31 DIAGNOSIS — Z992 Dependence on renal dialysis: Secondary | ICD-10-CM | POA: Diagnosis not present

## 2018-07-31 DIAGNOSIS — N2581 Secondary hyperparathyroidism of renal origin: Secondary | ICD-10-CM | POA: Diagnosis not present

## 2018-07-31 DIAGNOSIS — E1121 Type 2 diabetes mellitus with diabetic nephropathy: Secondary | ICD-10-CM | POA: Diagnosis not present

## 2018-08-01 ENCOUNTER — Encounter (HOSPITAL_BASED_OUTPATIENT_CLINIC_OR_DEPARTMENT_OTHER): Payer: Medicare Other | Attending: Internal Medicine

## 2018-08-01 DIAGNOSIS — Z992 Dependence on renal dialysis: Secondary | ICD-10-CM | POA: Diagnosis not present

## 2018-08-01 DIAGNOSIS — L84 Corns and callosities: Secondary | ICD-10-CM | POA: Insufficient documentation

## 2018-08-01 DIAGNOSIS — E1140 Type 2 diabetes mellitus with diabetic neuropathy, unspecified: Secondary | ICD-10-CM | POA: Insufficient documentation

## 2018-08-01 DIAGNOSIS — N185 Chronic kidney disease, stage 5: Secondary | ICD-10-CM | POA: Diagnosis not present

## 2018-08-01 DIAGNOSIS — L97522 Non-pressure chronic ulcer of other part of left foot with fat layer exposed: Secondary | ICD-10-CM | POA: Insufficient documentation

## 2018-08-01 DIAGNOSIS — E11621 Type 2 diabetes mellitus with foot ulcer: Secondary | ICD-10-CM | POA: Insufficient documentation

## 2018-08-01 DIAGNOSIS — Z794 Long term (current) use of insulin: Secondary | ICD-10-CM | POA: Insufficient documentation

## 2018-08-01 DIAGNOSIS — E1122 Type 2 diabetes mellitus with diabetic chronic kidney disease: Secondary | ICD-10-CM | POA: Insufficient documentation

## 2018-08-01 DIAGNOSIS — L97512 Non-pressure chronic ulcer of other part of right foot with fat layer exposed: Secondary | ICD-10-CM | POA: Insufficient documentation

## 2018-08-01 DIAGNOSIS — L97526 Non-pressure chronic ulcer of other part of left foot with bone involvement without evidence of necrosis: Secondary | ICD-10-CM | POA: Diagnosis not present

## 2018-08-01 DIAGNOSIS — Z87891 Personal history of nicotine dependence: Secondary | ICD-10-CM | POA: Insufficient documentation

## 2018-08-01 DIAGNOSIS — M86372 Chronic multifocal osteomyelitis, left ankle and foot: Secondary | ICD-10-CM | POA: Insufficient documentation

## 2018-08-01 DIAGNOSIS — Z89421 Acquired absence of other right toe(s): Secondary | ICD-10-CM | POA: Diagnosis not present

## 2018-08-01 DIAGNOSIS — Z89422 Acquired absence of other left toe(s): Secondary | ICD-10-CM | POA: Diagnosis not present

## 2018-08-01 DIAGNOSIS — L97524 Non-pressure chronic ulcer of other part of left foot with necrosis of bone: Secondary | ICD-10-CM | POA: Diagnosis not present

## 2018-08-01 DIAGNOSIS — I429 Cardiomyopathy, unspecified: Secondary | ICD-10-CM | POA: Diagnosis not present

## 2018-08-02 ENCOUNTER — Encounter: Payer: Self-pay | Admitting: Infectious Diseases

## 2018-08-02 DIAGNOSIS — N2581 Secondary hyperparathyroidism of renal origin: Secondary | ICD-10-CM | POA: Diagnosis not present

## 2018-08-02 DIAGNOSIS — D509 Iron deficiency anemia, unspecified: Secondary | ICD-10-CM | POA: Diagnosis not present

## 2018-08-02 DIAGNOSIS — E1121 Type 2 diabetes mellitus with diabetic nephropathy: Secondary | ICD-10-CM | POA: Diagnosis not present

## 2018-08-02 DIAGNOSIS — M86172 Other acute osteomyelitis, left ankle and foot: Secondary | ICD-10-CM | POA: Diagnosis not present

## 2018-08-02 DIAGNOSIS — Z992 Dependence on renal dialysis: Secondary | ICD-10-CM | POA: Diagnosis not present

## 2018-08-02 DIAGNOSIS — N186 End stage renal disease: Secondary | ICD-10-CM | POA: Diagnosis not present

## 2018-08-04 DIAGNOSIS — D509 Iron deficiency anemia, unspecified: Secondary | ICD-10-CM | POA: Diagnosis not present

## 2018-08-04 DIAGNOSIS — N186 End stage renal disease: Secondary | ICD-10-CM | POA: Diagnosis not present

## 2018-08-04 DIAGNOSIS — E1121 Type 2 diabetes mellitus with diabetic nephropathy: Secondary | ICD-10-CM | POA: Diagnosis not present

## 2018-08-04 DIAGNOSIS — M86172 Other acute osteomyelitis, left ankle and foot: Secondary | ICD-10-CM | POA: Diagnosis not present

## 2018-08-04 DIAGNOSIS — Z992 Dependence on renal dialysis: Secondary | ICD-10-CM | POA: Diagnosis not present

## 2018-08-04 DIAGNOSIS — N2581 Secondary hyperparathyroidism of renal origin: Secondary | ICD-10-CM | POA: Diagnosis not present

## 2018-08-07 DIAGNOSIS — D509 Iron deficiency anemia, unspecified: Secondary | ICD-10-CM | POA: Diagnosis not present

## 2018-08-07 DIAGNOSIS — M86172 Other acute osteomyelitis, left ankle and foot: Secondary | ICD-10-CM | POA: Diagnosis not present

## 2018-08-07 DIAGNOSIS — Z992 Dependence on renal dialysis: Secondary | ICD-10-CM | POA: Diagnosis not present

## 2018-08-07 DIAGNOSIS — E1121 Type 2 diabetes mellitus with diabetic nephropathy: Secondary | ICD-10-CM | POA: Diagnosis not present

## 2018-08-07 DIAGNOSIS — N2581 Secondary hyperparathyroidism of renal origin: Secondary | ICD-10-CM | POA: Diagnosis not present

## 2018-08-07 DIAGNOSIS — N186 End stage renal disease: Secondary | ICD-10-CM | POA: Diagnosis not present

## 2018-08-09 DIAGNOSIS — E1121 Type 2 diabetes mellitus with diabetic nephropathy: Secondary | ICD-10-CM | POA: Diagnosis not present

## 2018-08-09 DIAGNOSIS — D509 Iron deficiency anemia, unspecified: Secondary | ICD-10-CM | POA: Diagnosis not present

## 2018-08-09 DIAGNOSIS — Z992 Dependence on renal dialysis: Secondary | ICD-10-CM | POA: Diagnosis not present

## 2018-08-09 DIAGNOSIS — N186 End stage renal disease: Secondary | ICD-10-CM | POA: Diagnosis not present

## 2018-08-09 DIAGNOSIS — M86172 Other acute osteomyelitis, left ankle and foot: Secondary | ICD-10-CM | POA: Diagnosis not present

## 2018-08-09 DIAGNOSIS — N2581 Secondary hyperparathyroidism of renal origin: Secondary | ICD-10-CM | POA: Diagnosis not present

## 2018-08-10 DIAGNOSIS — E1122 Type 2 diabetes mellitus with diabetic chronic kidney disease: Secondary | ICD-10-CM | POA: Diagnosis not present

## 2018-08-10 DIAGNOSIS — E11621 Type 2 diabetes mellitus with foot ulcer: Secondary | ICD-10-CM | POA: Diagnosis not present

## 2018-08-10 DIAGNOSIS — N185 Chronic kidney disease, stage 5: Secondary | ICD-10-CM | POA: Diagnosis not present

## 2018-08-10 DIAGNOSIS — L97526 Non-pressure chronic ulcer of other part of left foot with bone involvement without evidence of necrosis: Secondary | ICD-10-CM | POA: Diagnosis not present

## 2018-08-10 DIAGNOSIS — L97512 Non-pressure chronic ulcer of other part of right foot with fat layer exposed: Secondary | ICD-10-CM | POA: Diagnosis not present

## 2018-08-10 DIAGNOSIS — L97522 Non-pressure chronic ulcer of other part of left foot with fat layer exposed: Secondary | ICD-10-CM | POA: Diagnosis not present

## 2018-08-11 DIAGNOSIS — E1121 Type 2 diabetes mellitus with diabetic nephropathy: Secondary | ICD-10-CM | POA: Diagnosis not present

## 2018-08-11 DIAGNOSIS — D509 Iron deficiency anemia, unspecified: Secondary | ICD-10-CM | POA: Diagnosis not present

## 2018-08-11 DIAGNOSIS — M86172 Other acute osteomyelitis, left ankle and foot: Secondary | ICD-10-CM | POA: Diagnosis not present

## 2018-08-11 DIAGNOSIS — N2581 Secondary hyperparathyroidism of renal origin: Secondary | ICD-10-CM | POA: Diagnosis not present

## 2018-08-11 DIAGNOSIS — N186 End stage renal disease: Secondary | ICD-10-CM | POA: Diagnosis not present

## 2018-08-11 DIAGNOSIS — Z992 Dependence on renal dialysis: Secondary | ICD-10-CM | POA: Diagnosis not present

## 2018-08-14 DIAGNOSIS — M86172 Other acute osteomyelitis, left ankle and foot: Secondary | ICD-10-CM | POA: Diagnosis not present

## 2018-08-14 DIAGNOSIS — N186 End stage renal disease: Secondary | ICD-10-CM | POA: Diagnosis not present

## 2018-08-14 DIAGNOSIS — E1121 Type 2 diabetes mellitus with diabetic nephropathy: Secondary | ICD-10-CM | POA: Diagnosis not present

## 2018-08-14 DIAGNOSIS — D509 Iron deficiency anemia, unspecified: Secondary | ICD-10-CM | POA: Diagnosis not present

## 2018-08-14 DIAGNOSIS — N2581 Secondary hyperparathyroidism of renal origin: Secondary | ICD-10-CM | POA: Diagnosis not present

## 2018-08-14 DIAGNOSIS — Z992 Dependence on renal dialysis: Secondary | ICD-10-CM | POA: Diagnosis not present

## 2018-08-16 DIAGNOSIS — N186 End stage renal disease: Secondary | ICD-10-CM | POA: Diagnosis not present

## 2018-08-16 DIAGNOSIS — D509 Iron deficiency anemia, unspecified: Secondary | ICD-10-CM | POA: Diagnosis not present

## 2018-08-16 DIAGNOSIS — M86172 Other acute osteomyelitis, left ankle and foot: Secondary | ICD-10-CM | POA: Diagnosis not present

## 2018-08-16 DIAGNOSIS — N2581 Secondary hyperparathyroidism of renal origin: Secondary | ICD-10-CM | POA: Diagnosis not present

## 2018-08-16 DIAGNOSIS — Z992 Dependence on renal dialysis: Secondary | ICD-10-CM | POA: Diagnosis not present

## 2018-08-16 DIAGNOSIS — E1121 Type 2 diabetes mellitus with diabetic nephropathy: Secondary | ICD-10-CM | POA: Diagnosis not present

## 2018-08-17 ENCOUNTER — Other Ambulatory Visit (HOSPITAL_COMMUNITY)
Admission: RE | Admit: 2018-08-17 | Discharge: 2018-08-17 | Disposition: A | Discharge status: Home / Self Care | Payer: Medicare Other | Source: Other Acute Inpatient Hospital | Attending: Internal Medicine | Admitting: Internal Medicine

## 2018-08-17 DIAGNOSIS — L97522 Non-pressure chronic ulcer of other part of left foot with fat layer exposed: Secondary | ICD-10-CM | POA: Diagnosis not present

## 2018-08-17 DIAGNOSIS — E11621 Type 2 diabetes mellitus with foot ulcer: Secondary | ICD-10-CM | POA: Insufficient documentation

## 2018-08-17 DIAGNOSIS — N185 Chronic kidney disease, stage 5: Secondary | ICD-10-CM | POA: Diagnosis not present

## 2018-08-17 DIAGNOSIS — L97526 Non-pressure chronic ulcer of other part of left foot with bone involvement without evidence of necrosis: Secondary | ICD-10-CM | POA: Diagnosis not present

## 2018-08-17 DIAGNOSIS — M7989 Other specified soft tissue disorders: Secondary | ICD-10-CM | POA: Diagnosis not present

## 2018-08-17 DIAGNOSIS — E1122 Type 2 diabetes mellitus with diabetic chronic kidney disease: Secondary | ICD-10-CM | POA: Diagnosis not present

## 2018-08-17 DIAGNOSIS — L97512 Non-pressure chronic ulcer of other part of right foot with fat layer exposed: Secondary | ICD-10-CM | POA: Diagnosis not present

## 2018-08-18 DIAGNOSIS — N186 End stage renal disease: Secondary | ICD-10-CM | POA: Diagnosis not present

## 2018-08-18 DIAGNOSIS — D509 Iron deficiency anemia, unspecified: Secondary | ICD-10-CM | POA: Diagnosis not present

## 2018-08-18 DIAGNOSIS — E1121 Type 2 diabetes mellitus with diabetic nephropathy: Secondary | ICD-10-CM | POA: Diagnosis not present

## 2018-08-18 DIAGNOSIS — M86172 Other acute osteomyelitis, left ankle and foot: Secondary | ICD-10-CM | POA: Diagnosis not present

## 2018-08-18 DIAGNOSIS — Z992 Dependence on renal dialysis: Secondary | ICD-10-CM | POA: Diagnosis not present

## 2018-08-18 DIAGNOSIS — N2581 Secondary hyperparathyroidism of renal origin: Secondary | ICD-10-CM | POA: Diagnosis not present

## 2018-08-21 DIAGNOSIS — N2581 Secondary hyperparathyroidism of renal origin: Secondary | ICD-10-CM | POA: Diagnosis not present

## 2018-08-21 DIAGNOSIS — E1121 Type 2 diabetes mellitus with diabetic nephropathy: Secondary | ICD-10-CM | POA: Diagnosis not present

## 2018-08-21 DIAGNOSIS — D509 Iron deficiency anemia, unspecified: Secondary | ICD-10-CM | POA: Diagnosis not present

## 2018-08-21 DIAGNOSIS — Z992 Dependence on renal dialysis: Secondary | ICD-10-CM | POA: Diagnosis not present

## 2018-08-21 DIAGNOSIS — M86172 Other acute osteomyelitis, left ankle and foot: Secondary | ICD-10-CM | POA: Diagnosis not present

## 2018-08-21 DIAGNOSIS — Z112 Encounter for screening for other bacterial diseases: Secondary | ICD-10-CM | POA: Diagnosis not present

## 2018-08-21 DIAGNOSIS — N186 End stage renal disease: Secondary | ICD-10-CM | POA: Diagnosis not present

## 2018-08-21 LAB — AEROBIC CULTURE W GRAM STAIN (SUPERFICIAL SPECIMEN): Culture: NO GROWTH

## 2018-08-21 LAB — AEROBIC CULTURE  (SUPERFICIAL SPECIMEN)

## 2018-08-23 DIAGNOSIS — Z992 Dependence on renal dialysis: Secondary | ICD-10-CM | POA: Diagnosis not present

## 2018-08-23 DIAGNOSIS — N2581 Secondary hyperparathyroidism of renal origin: Secondary | ICD-10-CM | POA: Diagnosis not present

## 2018-08-23 DIAGNOSIS — E1121 Type 2 diabetes mellitus with diabetic nephropathy: Secondary | ICD-10-CM | POA: Diagnosis not present

## 2018-08-23 DIAGNOSIS — N186 End stage renal disease: Secondary | ICD-10-CM | POA: Diagnosis not present

## 2018-08-23 DIAGNOSIS — D509 Iron deficiency anemia, unspecified: Secondary | ICD-10-CM | POA: Diagnosis not present

## 2018-08-23 DIAGNOSIS — M86172 Other acute osteomyelitis, left ankle and foot: Secondary | ICD-10-CM | POA: Diagnosis not present

## 2018-08-24 DIAGNOSIS — L97526 Non-pressure chronic ulcer of other part of left foot with bone involvement without evidence of necrosis: Secondary | ICD-10-CM | POA: Diagnosis not present

## 2018-08-24 DIAGNOSIS — E11621 Type 2 diabetes mellitus with foot ulcer: Secondary | ICD-10-CM | POA: Diagnosis not present

## 2018-08-24 DIAGNOSIS — E1122 Type 2 diabetes mellitus with diabetic chronic kidney disease: Secondary | ICD-10-CM | POA: Diagnosis not present

## 2018-08-24 DIAGNOSIS — S91302A Unspecified open wound, left foot, initial encounter: Secondary | ICD-10-CM | POA: Diagnosis not present

## 2018-08-24 DIAGNOSIS — L97522 Non-pressure chronic ulcer of other part of left foot with fat layer exposed: Secondary | ICD-10-CM | POA: Diagnosis not present

## 2018-08-24 DIAGNOSIS — L97512 Non-pressure chronic ulcer of other part of right foot with fat layer exposed: Secondary | ICD-10-CM | POA: Diagnosis not present

## 2018-08-24 DIAGNOSIS — N185 Chronic kidney disease, stage 5: Secondary | ICD-10-CM | POA: Diagnosis not present

## 2018-08-25 ENCOUNTER — Other Ambulatory Visit (HOSPITAL_BASED_OUTPATIENT_CLINIC_OR_DEPARTMENT_OTHER): Payer: Self-pay | Admitting: Internal Medicine

## 2018-08-25 DIAGNOSIS — N2581 Secondary hyperparathyroidism of renal origin: Secondary | ICD-10-CM | POA: Diagnosis not present

## 2018-08-25 DIAGNOSIS — N186 End stage renal disease: Secondary | ICD-10-CM | POA: Diagnosis not present

## 2018-08-25 DIAGNOSIS — M86172 Other acute osteomyelitis, left ankle and foot: Secondary | ICD-10-CM | POA: Diagnosis not present

## 2018-08-25 DIAGNOSIS — D509 Iron deficiency anemia, unspecified: Secondary | ICD-10-CM | POA: Diagnosis not present

## 2018-08-25 DIAGNOSIS — E1121 Type 2 diabetes mellitus with diabetic nephropathy: Secondary | ICD-10-CM | POA: Diagnosis not present

## 2018-08-25 DIAGNOSIS — Z992 Dependence on renal dialysis: Secondary | ICD-10-CM | POA: Diagnosis not present

## 2018-08-25 DIAGNOSIS — E08621 Diabetes mellitus due to underlying condition with foot ulcer: Secondary | ICD-10-CM

## 2018-08-25 DIAGNOSIS — L97501 Non-pressure chronic ulcer of other part of unspecified foot limited to breakdown of skin: Principal | ICD-10-CM

## 2018-08-27 DIAGNOSIS — Z992 Dependence on renal dialysis: Secondary | ICD-10-CM | POA: Diagnosis not present

## 2018-08-27 DIAGNOSIS — E1122 Type 2 diabetes mellitus with diabetic chronic kidney disease: Secondary | ICD-10-CM | POA: Diagnosis not present

## 2018-08-27 DIAGNOSIS — N186 End stage renal disease: Secondary | ICD-10-CM | POA: Diagnosis not present

## 2018-08-28 DIAGNOSIS — M86172 Other acute osteomyelitis, left ankle and foot: Secondary | ICD-10-CM | POA: Diagnosis not present

## 2018-08-28 DIAGNOSIS — E1121 Type 2 diabetes mellitus with diabetic nephropathy: Secondary | ICD-10-CM | POA: Diagnosis not present

## 2018-08-28 DIAGNOSIS — Z23 Encounter for immunization: Secondary | ICD-10-CM | POA: Diagnosis not present

## 2018-08-28 DIAGNOSIS — N186 End stage renal disease: Secondary | ICD-10-CM | POA: Diagnosis not present

## 2018-08-28 DIAGNOSIS — N2581 Secondary hyperparathyroidism of renal origin: Secondary | ICD-10-CM | POA: Diagnosis not present

## 2018-08-28 DIAGNOSIS — Z992 Dependence on renal dialysis: Secondary | ICD-10-CM | POA: Diagnosis not present

## 2018-08-28 DIAGNOSIS — D509 Iron deficiency anemia, unspecified: Secondary | ICD-10-CM | POA: Diagnosis not present

## 2018-08-29 ENCOUNTER — Ambulatory Visit (INDEPENDENT_AMBULATORY_CARE_PROVIDER_SITE_OTHER): Payer: Medicare Other | Admitting: Infectious Diseases

## 2018-08-29 ENCOUNTER — Encounter: Payer: Self-pay | Admitting: Infectious Diseases

## 2018-08-29 DIAGNOSIS — L97529 Non-pressure chronic ulcer of other part of left foot with unspecified severity: Secondary | ICD-10-CM | POA: Diagnosis not present

## 2018-08-29 DIAGNOSIS — E11621 Type 2 diabetes mellitus with foot ulcer: Secondary | ICD-10-CM

## 2018-08-29 DIAGNOSIS — N186 End stage renal disease: Secondary | ICD-10-CM | POA: Diagnosis not present

## 2018-08-29 DIAGNOSIS — Z992 Dependence on renal dialysis: Secondary | ICD-10-CM | POA: Diagnosis not present

## 2018-08-29 DIAGNOSIS — M86172 Other acute osteomyelitis, left ankle and foot: Secondary | ICD-10-CM | POA: Diagnosis not present

## 2018-08-29 NOTE — Assessment & Plan Note (Signed)
Appreciate the excellent care of his nephrologist.  Will cc note to them.

## 2018-08-29 NOTE — Progress Notes (Signed)
   Subjective:    Patient ID: [name] MONCUS Sr., male    DOB: 1951/10/21, 67 y.o.   MRN: [id number]  HPI 67yo M with hx of DM2, ESRD, CAD, and ulcers on R first metatarsal head; L 5th and L 1st MT heads.  He was seen in ID on 5-21-18with worsening of his R 5th ray and was sent to hospital. His wound Cx was polymicrobial.  On 2-1-19he underwent(by Dr [name]) 1. 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 2. Excisional debridement of plantar wound. 3. Amputation of the left 2nd toe at the interphalangeal joint. 4. Excisional debridement of skin, subcutaneous tissue, and bone at the site of a complex wound, left foot.  He had ID f/u 02-02-18 and was continued on IV anbx with HD for 6 weeks (end date 04-06-18) He had ID f/u on 03-28-18 and was felt to be doing well.   He returned to hospital on 5-15 with sepsis, from his L foot. States he had "pus pockets" that the ED MD lanced. 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 Wound Cx MRSA (Vanco MIC 0.5, S-Doxy, R- bactrim). He was seen by ID and continued on vancomycin post d/c (end date 06-21-18). He had ortho f/u (no plans for further intervention) and vascular who did a PTCA and stent L tibial artery.   Last A1C 7.3% (05-2018).  He was seen in [facility] and had bone bx that showed osteo in his L foot, necrotic/edematous soft tissue.  He had bone Cx that showed Enterobacter cloacae (R- ancef) and Klebsiella oxytoca (R-amp).  He was seen in ID on 8-1 and started on ceftaz with HD for 6 weeks.  He was put into solid cast on his R foot for 4 weeks, planned for ? Has it changed every week.  His L foot previous wound has been healing but now has new wound over his previous wound site where he had VAC. This developed a new abscess with tunnel. He had a Cx done which was (-).  Worried wound on his L foot is not healing, draining internally.  He is to have f/u MRI on 08-31-18.  States his Leonides Schanz has completed  on 8-27.   States BP was > 200 on 8-31. Normal since.   Review of Systems  Constitutional: Negative for chills and fever.  Respiratory: Negative for shortness of breath.   Cardiovascular: Negative for chest pain.  Gastrointestinal: Negative for constipation and diarrhea.  Skin: Positive for wound.  Neurological: Negative for headaches.  has SOB post HD.      Objective:   Physical Exam  Constitutional: He is oriented to person, place, and time. He appears well-developed and well-nourished.  Eyes: EOM are normal.  Cardiovascular: Normal rate, regular rhythm and normal heart sounds.  Pulmonary/Chest: Effort normal and breath sounds normal.  Abdominal: Soft. Bowel sounds are normal. He exhibits no distension. There is no tenderness.  Neurological: He is alert and oriented to person, place, and time.  Skin:     Psychiatric: He has a normal mood and affect.         Assessment & Plan:

## 2018-08-29 NOTE — Assessment & Plan Note (Signed)
Will extend his ceftaz for 2 more weeks.  Will see him back in ~4 weeks I share his concern that there is tunneling beneath the two wounds. Removing the callus between them could create a very large wound.

## 2018-08-29 NOTE — Assessment & Plan Note (Signed)
States his last A1C was 6% Appears to be well controlled.

## 2018-08-30 DIAGNOSIS — Z992 Dependence on renal dialysis: Secondary | ICD-10-CM | POA: Diagnosis not present

## 2018-08-30 DIAGNOSIS — D509 Iron deficiency anemia, unspecified: Secondary | ICD-10-CM | POA: Diagnosis not present

## 2018-08-30 DIAGNOSIS — N2581 Secondary hyperparathyroidism of renal origin: Secondary | ICD-10-CM | POA: Diagnosis not present

## 2018-08-30 DIAGNOSIS — M86172 Other acute osteomyelitis, left ankle and foot: Secondary | ICD-10-CM | POA: Diagnosis not present

## 2018-08-30 DIAGNOSIS — E1121 Type 2 diabetes mellitus with diabetic nephropathy: Secondary | ICD-10-CM | POA: Diagnosis not present

## 2018-08-30 DIAGNOSIS — N186 End stage renal disease: Secondary | ICD-10-CM | POA: Diagnosis not present

## 2018-08-31 ENCOUNTER — Encounter (HOSPITAL_BASED_OUTPATIENT_CLINIC_OR_DEPARTMENT_OTHER): Payer: Medicare Other | Attending: Internal Medicine

## 2018-08-31 ENCOUNTER — Ambulatory Visit (HOSPITAL_COMMUNITY)
Admission: RE | Admit: 2018-08-31 | Discharge: 2018-08-31 | Disposition: A | Discharge status: Home / Self Care | Payer: Medicare Other | Source: Ambulatory Visit | Attending: Internal Medicine | Admitting: Internal Medicine

## 2018-08-31 DIAGNOSIS — E11621 Type 2 diabetes mellitus with foot ulcer: Secondary | ICD-10-CM | POA: Diagnosis not present

## 2018-08-31 DIAGNOSIS — L97512 Non-pressure chronic ulcer of other part of right foot with fat layer exposed: Secondary | ICD-10-CM | POA: Diagnosis not present

## 2018-08-31 DIAGNOSIS — L97501 Non-pressure chronic ulcer of other part of unspecified foot limited to breakdown of skin: Secondary | ICD-10-CM

## 2018-08-31 DIAGNOSIS — I110 Hypertensive heart disease with heart failure: Secondary | ICD-10-CM | POA: Insufficient documentation

## 2018-08-31 DIAGNOSIS — I130 Hypertensive heart and chronic kidney disease with heart failure and stage 1 through stage 4 chronic kidney disease, or unspecified chronic kidney disease: Secondary | ICD-10-CM | POA: Insufficient documentation

## 2018-08-31 DIAGNOSIS — M868X7 Other osteomyelitis, ankle and foot: Secondary | ICD-10-CM | POA: Insufficient documentation

## 2018-08-31 DIAGNOSIS — I509 Heart failure, unspecified: Secondary | ICD-10-CM | POA: Diagnosis not present

## 2018-08-31 DIAGNOSIS — E1122 Type 2 diabetes mellitus with diabetic chronic kidney disease: Secondary | ICD-10-CM | POA: Diagnosis not present

## 2018-08-31 DIAGNOSIS — B9562 Methicillin resistant Staphylococcus aureus infection as the cause of diseases classified elsewhere: Secondary | ICD-10-CM | POA: Insufficient documentation

## 2018-08-31 DIAGNOSIS — M86372 Chronic multifocal osteomyelitis, left ankle and foot: Secondary | ICD-10-CM | POA: Diagnosis not present

## 2018-08-31 DIAGNOSIS — N185 Chronic kidney disease, stage 5: Secondary | ICD-10-CM | POA: Diagnosis not present

## 2018-08-31 DIAGNOSIS — E1169 Type 2 diabetes mellitus with other specified complication: Secondary | ICD-10-CM | POA: Diagnosis not present

## 2018-08-31 DIAGNOSIS — S91302A Unspecified open wound, left foot, initial encounter: Secondary | ICD-10-CM | POA: Diagnosis not present

## 2018-08-31 DIAGNOSIS — L089 Local infection of the skin and subcutaneous tissue, unspecified: Secondary | ICD-10-CM | POA: Diagnosis not present

## 2018-08-31 DIAGNOSIS — L97522 Non-pressure chronic ulcer of other part of left foot with fat layer exposed: Secondary | ICD-10-CM | POA: Diagnosis not present

## 2018-08-31 DIAGNOSIS — M19072 Primary osteoarthritis, left ankle and foot: Secondary | ICD-10-CM | POA: Insufficient documentation

## 2018-08-31 DIAGNOSIS — Z992 Dependence on renal dialysis: Secondary | ICD-10-CM | POA: Insufficient documentation

## 2018-08-31 DIAGNOSIS — E1140 Type 2 diabetes mellitus with diabetic neuropathy, unspecified: Secondary | ICD-10-CM | POA: Diagnosis not present

## 2018-08-31 DIAGNOSIS — E08621 Diabetes mellitus due to underlying condition with foot ulcer: Secondary | ICD-10-CM

## 2018-09-01 DIAGNOSIS — Z992 Dependence on renal dialysis: Secondary | ICD-10-CM | POA: Diagnosis not present

## 2018-09-01 DIAGNOSIS — E1121 Type 2 diabetes mellitus with diabetic nephropathy: Secondary | ICD-10-CM | POA: Diagnosis not present

## 2018-09-01 DIAGNOSIS — D509 Iron deficiency anemia, unspecified: Secondary | ICD-10-CM | POA: Diagnosis not present

## 2018-09-01 DIAGNOSIS — N186 End stage renal disease: Secondary | ICD-10-CM | POA: Diagnosis not present

## 2018-09-01 DIAGNOSIS — M86172 Other acute osteomyelitis, left ankle and foot: Secondary | ICD-10-CM | POA: Diagnosis not present

## 2018-09-01 DIAGNOSIS — N2581 Secondary hyperparathyroidism of renal origin: Secondary | ICD-10-CM | POA: Diagnosis not present

## 2018-09-04 DIAGNOSIS — N2581 Secondary hyperparathyroidism of renal origin: Secondary | ICD-10-CM | POA: Diagnosis not present

## 2018-09-04 DIAGNOSIS — D509 Iron deficiency anemia, unspecified: Secondary | ICD-10-CM | POA: Diagnosis not present

## 2018-09-04 DIAGNOSIS — E1121 Type 2 diabetes mellitus with diabetic nephropathy: Secondary | ICD-10-CM | POA: Diagnosis not present

## 2018-09-04 DIAGNOSIS — N186 End stage renal disease: Secondary | ICD-10-CM | POA: Diagnosis not present

## 2018-09-04 DIAGNOSIS — M86172 Other acute osteomyelitis, left ankle and foot: Secondary | ICD-10-CM | POA: Diagnosis not present

## 2018-09-04 DIAGNOSIS — Z992 Dependence on renal dialysis: Secondary | ICD-10-CM | POA: Diagnosis not present

## 2018-09-05 DIAGNOSIS — E1165 Type 2 diabetes mellitus with hyperglycemia: Secondary | ICD-10-CM | POA: Diagnosis not present

## 2018-09-05 DIAGNOSIS — E119 Type 2 diabetes mellitus without complications: Secondary | ICD-10-CM | POA: Diagnosis not present

## 2018-09-05 DIAGNOSIS — I10 Essential (primary) hypertension: Secondary | ICD-10-CM | POA: Diagnosis not present

## 2018-09-05 DIAGNOSIS — I951 Orthostatic hypotension: Secondary | ICD-10-CM | POA: Diagnosis not present

## 2018-09-06 ENCOUNTER — Ambulatory Visit (HOSPITAL_COMMUNITY): Payer: Medicare Other | Attending: Vascular Surgery

## 2018-09-06 DIAGNOSIS — N2581 Secondary hyperparathyroidism of renal origin: Secondary | ICD-10-CM | POA: Diagnosis not present

## 2018-09-06 DIAGNOSIS — D509 Iron deficiency anemia, unspecified: Secondary | ICD-10-CM | POA: Diagnosis not present

## 2018-09-06 DIAGNOSIS — E1121 Type 2 diabetes mellitus with diabetic nephropathy: Secondary | ICD-10-CM | POA: Diagnosis not present

## 2018-09-06 DIAGNOSIS — M86172 Other acute osteomyelitis, left ankle and foot: Secondary | ICD-10-CM | POA: Diagnosis not present

## 2018-09-06 DIAGNOSIS — Z992 Dependence on renal dialysis: Secondary | ICD-10-CM | POA: Diagnosis not present

## 2018-09-06 DIAGNOSIS — N186 End stage renal disease: Secondary | ICD-10-CM | POA: Diagnosis not present

## 2018-09-07 ENCOUNTER — Other Ambulatory Visit (HOSPITAL_COMMUNITY)
Admission: RE | Admit: 2018-09-07 | Discharge: 2018-09-07 | Disposition: A | Discharge status: Home / Self Care | Payer: Medicare Other | Source: Other Acute Inpatient Hospital | Attending: Internal Medicine | Admitting: Internal Medicine

## 2018-09-07 DIAGNOSIS — E11621 Type 2 diabetes mellitus with foot ulcer: Secondary | ICD-10-CM | POA: Insufficient documentation

## 2018-09-07 DIAGNOSIS — B9562 Methicillin resistant Staphylococcus aureus infection as the cause of diseases classified elsewhere: Secondary | ICD-10-CM | POA: Diagnosis not present

## 2018-09-07 DIAGNOSIS — E1169 Type 2 diabetes mellitus with other specified complication: Secondary | ICD-10-CM | POA: Diagnosis not present

## 2018-09-07 DIAGNOSIS — L97512 Non-pressure chronic ulcer of other part of right foot with fat layer exposed: Secondary | ICD-10-CM | POA: Diagnosis not present

## 2018-09-07 DIAGNOSIS — M86372 Chronic multifocal osteomyelitis, left ankle and foot: Secondary | ICD-10-CM | POA: Diagnosis not present

## 2018-09-07 DIAGNOSIS — L97522 Non-pressure chronic ulcer of other part of left foot with fat layer exposed: Secondary | ICD-10-CM | POA: Diagnosis not present

## 2018-09-08 DIAGNOSIS — E1121 Type 2 diabetes mellitus with diabetic nephropathy: Secondary | ICD-10-CM | POA: Diagnosis not present

## 2018-09-08 DIAGNOSIS — Z992 Dependence on renal dialysis: Secondary | ICD-10-CM | POA: Diagnosis not present

## 2018-09-08 DIAGNOSIS — D509 Iron deficiency anemia, unspecified: Secondary | ICD-10-CM | POA: Diagnosis not present

## 2018-09-08 DIAGNOSIS — M86172 Other acute osteomyelitis, left ankle and foot: Secondary | ICD-10-CM | POA: Diagnosis not present

## 2018-09-08 DIAGNOSIS — N2581 Secondary hyperparathyroidism of renal origin: Secondary | ICD-10-CM | POA: Diagnosis not present

## 2018-09-08 DIAGNOSIS — N186 End stage renal disease: Secondary | ICD-10-CM | POA: Diagnosis not present

## 2018-09-11 DIAGNOSIS — D509 Iron deficiency anemia, unspecified: Secondary | ICD-10-CM | POA: Diagnosis not present

## 2018-09-11 DIAGNOSIS — N186 End stage renal disease: Secondary | ICD-10-CM | POA: Diagnosis not present

## 2018-09-11 DIAGNOSIS — E1121 Type 2 diabetes mellitus with diabetic nephropathy: Secondary | ICD-10-CM | POA: Diagnosis not present

## 2018-09-11 DIAGNOSIS — M86172 Other acute osteomyelitis, left ankle and foot: Secondary | ICD-10-CM | POA: Diagnosis not present

## 2018-09-11 DIAGNOSIS — Z992 Dependence on renal dialysis: Secondary | ICD-10-CM | POA: Diagnosis not present

## 2018-09-11 DIAGNOSIS — N2581 Secondary hyperparathyroidism of renal origin: Secondary | ICD-10-CM | POA: Diagnosis not present

## 2018-09-11 LAB — AEROBIC CULTURE  (SUPERFICIAL SPECIMEN)

## 2018-09-11 LAB — AEROBIC CULTURE W GRAM STAIN (SUPERFICIAL SPECIMEN)

## 2018-09-13 ENCOUNTER — Encounter: Payer: Self-pay | Admitting: Vascular Surgery

## 2018-09-13 ENCOUNTER — Ambulatory Visit (HOSPITAL_COMMUNITY): Payer: Medicare Other

## 2018-09-13 ENCOUNTER — Ambulatory Visit: Payer: Medicare Other | Admitting: Vascular Surgery

## 2018-09-13 DIAGNOSIS — N186 End stage renal disease: Secondary | ICD-10-CM | POA: Diagnosis not present

## 2018-09-13 DIAGNOSIS — N2581 Secondary hyperparathyroidism of renal origin: Secondary | ICD-10-CM | POA: Diagnosis not present

## 2018-09-13 DIAGNOSIS — Z992 Dependence on renal dialysis: Secondary | ICD-10-CM | POA: Diagnosis not present

## 2018-09-13 DIAGNOSIS — E1121 Type 2 diabetes mellitus with diabetic nephropathy: Secondary | ICD-10-CM | POA: Diagnosis not present

## 2018-09-13 DIAGNOSIS — M86172 Other acute osteomyelitis, left ankle and foot: Secondary | ICD-10-CM | POA: Diagnosis not present

## 2018-09-13 DIAGNOSIS — D509 Iron deficiency anemia, unspecified: Secondary | ICD-10-CM | POA: Diagnosis not present

## 2018-09-14 DIAGNOSIS — I951 Orthostatic hypotension: Secondary | ICD-10-CM | POA: Diagnosis not present

## 2018-09-14 DIAGNOSIS — E119 Type 2 diabetes mellitus without complications: Secondary | ICD-10-CM | POA: Diagnosis not present

## 2018-09-14 DIAGNOSIS — M86372 Chronic multifocal osteomyelitis, left ankle and foot: Secondary | ICD-10-CM | POA: Diagnosis not present

## 2018-09-14 DIAGNOSIS — L97522 Non-pressure chronic ulcer of other part of left foot with fat layer exposed: Secondary | ICD-10-CM | POA: Diagnosis not present

## 2018-09-14 DIAGNOSIS — Z0000 Encounter for general adult medical examination without abnormal findings: Secondary | ICD-10-CM | POA: Diagnosis not present

## 2018-09-14 DIAGNOSIS — L97512 Non-pressure chronic ulcer of other part of right foot with fat layer exposed: Secondary | ICD-10-CM | POA: Diagnosis not present

## 2018-09-14 DIAGNOSIS — R42 Dizziness and giddiness: Secondary | ICD-10-CM | POA: Diagnosis not present

## 2018-09-14 DIAGNOSIS — I10 Essential (primary) hypertension: Secondary | ICD-10-CM | POA: Diagnosis not present

## 2018-09-14 DIAGNOSIS — B9562 Methicillin resistant Staphylococcus aureus infection as the cause of diseases classified elsewhere: Secondary | ICD-10-CM | POA: Diagnosis not present

## 2018-09-14 DIAGNOSIS — E11621 Type 2 diabetes mellitus with foot ulcer: Secondary | ICD-10-CM | POA: Diagnosis not present

## 2018-09-14 DIAGNOSIS — E1169 Type 2 diabetes mellitus with other specified complication: Secondary | ICD-10-CM | POA: Diagnosis not present

## 2018-09-14 DIAGNOSIS — M86672 Other chronic osteomyelitis, left ankle and foot: Secondary | ICD-10-CM | POA: Diagnosis not present

## 2018-09-15 DIAGNOSIS — M86172 Other acute osteomyelitis, left ankle and foot: Secondary | ICD-10-CM | POA: Diagnosis not present

## 2018-09-15 DIAGNOSIS — Z992 Dependence on renal dialysis: Secondary | ICD-10-CM | POA: Diagnosis not present

## 2018-09-15 DIAGNOSIS — N186 End stage renal disease: Secondary | ICD-10-CM | POA: Diagnosis not present

## 2018-09-15 DIAGNOSIS — E1121 Type 2 diabetes mellitus with diabetic nephropathy: Secondary | ICD-10-CM | POA: Diagnosis not present

## 2018-09-15 DIAGNOSIS — D509 Iron deficiency anemia, unspecified: Secondary | ICD-10-CM | POA: Diagnosis not present

## 2018-09-15 DIAGNOSIS — N2581 Secondary hyperparathyroidism of renal origin: Secondary | ICD-10-CM | POA: Diagnosis not present

## 2018-09-18 DIAGNOSIS — E1121 Type 2 diabetes mellitus with diabetic nephropathy: Secondary | ICD-10-CM | POA: Diagnosis not present

## 2018-09-18 DIAGNOSIS — M86172 Other acute osteomyelitis, left ankle and foot: Secondary | ICD-10-CM | POA: Diagnosis not present

## 2018-09-18 DIAGNOSIS — Z992 Dependence on renal dialysis: Secondary | ICD-10-CM | POA: Diagnosis not present

## 2018-09-18 DIAGNOSIS — D509 Iron deficiency anemia, unspecified: Secondary | ICD-10-CM | POA: Diagnosis not present

## 2018-09-18 DIAGNOSIS — N186 End stage renal disease: Secondary | ICD-10-CM | POA: Diagnosis not present

## 2018-09-18 DIAGNOSIS — N2581 Secondary hyperparathyroidism of renal origin: Secondary | ICD-10-CM | POA: Diagnosis not present

## 2018-09-20 DIAGNOSIS — Z992 Dependence on renal dialysis: Secondary | ICD-10-CM | POA: Diagnosis not present

## 2018-09-20 DIAGNOSIS — N186 End stage renal disease: Secondary | ICD-10-CM | POA: Diagnosis not present

## 2018-09-20 DIAGNOSIS — D509 Iron deficiency anemia, unspecified: Secondary | ICD-10-CM | POA: Diagnosis not present

## 2018-09-20 DIAGNOSIS — M86172 Other acute osteomyelitis, left ankle and foot: Secondary | ICD-10-CM | POA: Diagnosis not present

## 2018-09-20 DIAGNOSIS — N2581 Secondary hyperparathyroidism of renal origin: Secondary | ICD-10-CM | POA: Diagnosis not present

## 2018-09-20 DIAGNOSIS — E1121 Type 2 diabetes mellitus with diabetic nephropathy: Secondary | ICD-10-CM | POA: Diagnosis not present

## 2018-09-21 DIAGNOSIS — M86372 Chronic multifocal osteomyelitis, left ankle and foot: Secondary | ICD-10-CM | POA: Diagnosis not present

## 2018-09-21 DIAGNOSIS — E1169 Type 2 diabetes mellitus with other specified complication: Secondary | ICD-10-CM | POA: Diagnosis not present

## 2018-09-21 DIAGNOSIS — E11621 Type 2 diabetes mellitus with foot ulcer: Secondary | ICD-10-CM | POA: Diagnosis not present

## 2018-09-21 DIAGNOSIS — L97522 Non-pressure chronic ulcer of other part of left foot with fat layer exposed: Secondary | ICD-10-CM | POA: Diagnosis not present

## 2018-09-21 DIAGNOSIS — B9562 Methicillin resistant Staphylococcus aureus infection as the cause of diseases classified elsewhere: Secondary | ICD-10-CM | POA: Diagnosis not present

## 2018-09-21 DIAGNOSIS — L97512 Non-pressure chronic ulcer of other part of right foot with fat layer exposed: Secondary | ICD-10-CM | POA: Diagnosis not present

## 2018-09-22 DIAGNOSIS — N2581 Secondary hyperparathyroidism of renal origin: Secondary | ICD-10-CM | POA: Diagnosis not present

## 2018-09-22 DIAGNOSIS — Z992 Dependence on renal dialysis: Secondary | ICD-10-CM | POA: Diagnosis not present

## 2018-09-22 DIAGNOSIS — M86172 Other acute osteomyelitis, left ankle and foot: Secondary | ICD-10-CM | POA: Diagnosis not present

## 2018-09-22 DIAGNOSIS — D509 Iron deficiency anemia, unspecified: Secondary | ICD-10-CM | POA: Diagnosis not present

## 2018-09-22 DIAGNOSIS — N186 End stage renal disease: Secondary | ICD-10-CM | POA: Diagnosis not present

## 2018-09-22 DIAGNOSIS — E1121 Type 2 diabetes mellitus with diabetic nephropathy: Secondary | ICD-10-CM | POA: Diagnosis not present

## 2018-09-25 DIAGNOSIS — E1121 Type 2 diabetes mellitus with diabetic nephropathy: Secondary | ICD-10-CM | POA: Diagnosis not present

## 2018-09-25 DIAGNOSIS — N186 End stage renal disease: Secondary | ICD-10-CM | POA: Diagnosis not present

## 2018-09-25 DIAGNOSIS — M86172 Other acute osteomyelitis, left ankle and foot: Secondary | ICD-10-CM | POA: Diagnosis not present

## 2018-09-25 DIAGNOSIS — Z992 Dependence on renal dialysis: Secondary | ICD-10-CM | POA: Diagnosis not present

## 2018-09-25 DIAGNOSIS — D509 Iron deficiency anemia, unspecified: Secondary | ICD-10-CM | POA: Diagnosis not present

## 2018-09-25 DIAGNOSIS — N2581 Secondary hyperparathyroidism of renal origin: Secondary | ICD-10-CM | POA: Diagnosis not present

## 2018-09-26 DIAGNOSIS — E1122 Type 2 diabetes mellitus with diabetic chronic kidney disease: Secondary | ICD-10-CM | POA: Diagnosis not present

## 2018-09-26 DIAGNOSIS — N186 End stage renal disease: Secondary | ICD-10-CM | POA: Diagnosis not present

## 2018-09-26 DIAGNOSIS — Z992 Dependence on renal dialysis: Secondary | ICD-10-CM | POA: Diagnosis not present

## 2018-09-27 DIAGNOSIS — N2581 Secondary hyperparathyroidism of renal origin: Secondary | ICD-10-CM | POA: Diagnosis not present

## 2018-09-27 DIAGNOSIS — E1121 Type 2 diabetes mellitus with diabetic nephropathy: Secondary | ICD-10-CM | POA: Diagnosis not present

## 2018-09-27 DIAGNOSIS — Z992 Dependence on renal dialysis: Secondary | ICD-10-CM | POA: Diagnosis not present

## 2018-09-27 DIAGNOSIS — D509 Iron deficiency anemia, unspecified: Secondary | ICD-10-CM | POA: Diagnosis not present

## 2018-09-27 DIAGNOSIS — M86172 Other acute osteomyelitis, left ankle and foot: Secondary | ICD-10-CM | POA: Diagnosis not present

## 2018-09-27 DIAGNOSIS — N186 End stage renal disease: Secondary | ICD-10-CM | POA: Diagnosis not present

## 2018-09-27 DIAGNOSIS — Z23 Encounter for immunization: Secondary | ICD-10-CM | POA: Diagnosis not present

## 2018-09-28 DIAGNOSIS — M79672 Pain in left foot: Secondary | ICD-10-CM | POA: Diagnosis not present

## 2018-09-28 DIAGNOSIS — M79671 Pain in right foot: Secondary | ICD-10-CM | POA: Diagnosis not present

## 2018-09-29 ENCOUNTER — Encounter (HOSPITAL_BASED_OUTPATIENT_CLINIC_OR_DEPARTMENT_OTHER): Payer: Medicare Other | Attending: Internal Medicine

## 2018-09-29 DIAGNOSIS — E1169 Type 2 diabetes mellitus with other specified complication: Secondary | ICD-10-CM | POA: Diagnosis not present

## 2018-09-29 DIAGNOSIS — L97512 Non-pressure chronic ulcer of other part of right foot with fat layer exposed: Secondary | ICD-10-CM | POA: Diagnosis not present

## 2018-09-29 DIAGNOSIS — L84 Corns and callosities: Secondary | ICD-10-CM | POA: Insufficient documentation

## 2018-09-29 DIAGNOSIS — E1140 Type 2 diabetes mellitus with diabetic neuropathy, unspecified: Secondary | ICD-10-CM | POA: Diagnosis not present

## 2018-09-29 DIAGNOSIS — E11621 Type 2 diabetes mellitus with foot ulcer: Secondary | ICD-10-CM | POA: Insufficient documentation

## 2018-09-29 DIAGNOSIS — Z23 Encounter for immunization: Secondary | ICD-10-CM | POA: Diagnosis not present

## 2018-09-29 DIAGNOSIS — M86372 Chronic multifocal osteomyelitis, left ankle and foot: Secondary | ICD-10-CM | POA: Diagnosis not present

## 2018-09-29 DIAGNOSIS — N185 Chronic kidney disease, stage 5: Secondary | ICD-10-CM | POA: Insufficient documentation

## 2018-09-29 DIAGNOSIS — D509 Iron deficiency anemia, unspecified: Secondary | ICD-10-CM | POA: Diagnosis not present

## 2018-09-29 DIAGNOSIS — L97522 Non-pressure chronic ulcer of other part of left foot with fat layer exposed: Secondary | ICD-10-CM | POA: Insufficient documentation

## 2018-09-29 DIAGNOSIS — E1151 Type 2 diabetes mellitus with diabetic peripheral angiopathy without gangrene: Secondary | ICD-10-CM | POA: Insufficient documentation

## 2018-09-29 DIAGNOSIS — E13621 Other specified diabetes mellitus with foot ulcer: Secondary | ICD-10-CM | POA: Diagnosis present

## 2018-09-29 DIAGNOSIS — E1122 Type 2 diabetes mellitus with diabetic chronic kidney disease: Secondary | ICD-10-CM | POA: Diagnosis not present

## 2018-09-29 DIAGNOSIS — N186 End stage renal disease: Secondary | ICD-10-CM | POA: Diagnosis not present

## 2018-09-29 DIAGNOSIS — I130 Hypertensive heart and chronic kidney disease with heart failure and stage 1 through stage 4 chronic kidney disease, or unspecified chronic kidney disease: Secondary | ICD-10-CM | POA: Diagnosis not present

## 2018-09-29 DIAGNOSIS — E1121 Type 2 diabetes mellitus with diabetic nephropathy: Secondary | ICD-10-CM | POA: Diagnosis not present

## 2018-09-29 DIAGNOSIS — I509 Heart failure, unspecified: Secondary | ICD-10-CM | POA: Insufficient documentation

## 2018-09-29 DIAGNOSIS — Z992 Dependence on renal dialysis: Secondary | ICD-10-CM | POA: Diagnosis not present

## 2018-09-29 DIAGNOSIS — N2581 Secondary hyperparathyroidism of renal origin: Secondary | ICD-10-CM | POA: Diagnosis not present

## 2018-10-02 DIAGNOSIS — Z23 Encounter for immunization: Secondary | ICD-10-CM | POA: Diagnosis not present

## 2018-10-02 DIAGNOSIS — E1121 Type 2 diabetes mellitus with diabetic nephropathy: Secondary | ICD-10-CM | POA: Diagnosis not present

## 2018-10-02 DIAGNOSIS — N186 End stage renal disease: Secondary | ICD-10-CM | POA: Diagnosis not present

## 2018-10-02 DIAGNOSIS — Z992 Dependence on renal dialysis: Secondary | ICD-10-CM | POA: Diagnosis not present

## 2018-10-02 DIAGNOSIS — D509 Iron deficiency anemia, unspecified: Secondary | ICD-10-CM | POA: Diagnosis not present

## 2018-10-02 DIAGNOSIS — N2581 Secondary hyperparathyroidism of renal origin: Secondary | ICD-10-CM | POA: Diagnosis not present

## 2018-10-04 DIAGNOSIS — Z992 Dependence on renal dialysis: Secondary | ICD-10-CM | POA: Diagnosis not present

## 2018-10-04 DIAGNOSIS — M86172 Other acute osteomyelitis, left ankle and foot: Secondary | ICD-10-CM | POA: Diagnosis not present

## 2018-10-04 DIAGNOSIS — E1121 Type 2 diabetes mellitus with diabetic nephropathy: Secondary | ICD-10-CM | POA: Diagnosis not present

## 2018-10-04 DIAGNOSIS — N186 End stage renal disease: Secondary | ICD-10-CM | POA: Diagnosis not present

## 2018-10-04 DIAGNOSIS — Z23 Encounter for immunization: Secondary | ICD-10-CM | POA: Diagnosis not present

## 2018-10-04 DIAGNOSIS — D509 Iron deficiency anemia, unspecified: Secondary | ICD-10-CM | POA: Diagnosis not present

## 2018-10-04 DIAGNOSIS — N2581 Secondary hyperparathyroidism of renal origin: Secondary | ICD-10-CM | POA: Diagnosis not present

## 2018-10-05 DIAGNOSIS — M86372 Chronic multifocal osteomyelitis, left ankle and foot: Secondary | ICD-10-CM | POA: Diagnosis not present

## 2018-10-05 DIAGNOSIS — E1122 Type 2 diabetes mellitus with diabetic chronic kidney disease: Secondary | ICD-10-CM | POA: Diagnosis not present

## 2018-10-05 DIAGNOSIS — E11621 Type 2 diabetes mellitus with foot ulcer: Secondary | ICD-10-CM | POA: Diagnosis not present

## 2018-10-05 DIAGNOSIS — S91302A Unspecified open wound, left foot, initial encounter: Secondary | ICD-10-CM | POA: Diagnosis not present

## 2018-10-05 DIAGNOSIS — L97512 Non-pressure chronic ulcer of other part of right foot with fat layer exposed: Secondary | ICD-10-CM | POA: Diagnosis not present

## 2018-10-05 DIAGNOSIS — E1169 Type 2 diabetes mellitus with other specified complication: Secondary | ICD-10-CM | POA: Diagnosis not present

## 2018-10-05 DIAGNOSIS — L089 Local infection of the skin and subcutaneous tissue, unspecified: Secondary | ICD-10-CM | POA: Diagnosis not present

## 2018-10-05 DIAGNOSIS — L97522 Non-pressure chronic ulcer of other part of left foot with fat layer exposed: Secondary | ICD-10-CM | POA: Diagnosis not present

## 2018-10-06 DIAGNOSIS — Z23 Encounter for immunization: Secondary | ICD-10-CM | POA: Diagnosis not present

## 2018-10-06 DIAGNOSIS — Z992 Dependence on renal dialysis: Secondary | ICD-10-CM | POA: Diagnosis not present

## 2018-10-06 DIAGNOSIS — E1121 Type 2 diabetes mellitus with diabetic nephropathy: Secondary | ICD-10-CM | POA: Diagnosis not present

## 2018-10-06 DIAGNOSIS — N186 End stage renal disease: Secondary | ICD-10-CM | POA: Diagnosis not present

## 2018-10-06 DIAGNOSIS — D509 Iron deficiency anemia, unspecified: Secondary | ICD-10-CM | POA: Diagnosis not present

## 2018-10-06 DIAGNOSIS — N2581 Secondary hyperparathyroidism of renal origin: Secondary | ICD-10-CM | POA: Diagnosis not present

## 2018-10-09 DIAGNOSIS — D509 Iron deficiency anemia, unspecified: Secondary | ICD-10-CM | POA: Diagnosis not present

## 2018-10-09 DIAGNOSIS — Z992 Dependence on renal dialysis: Secondary | ICD-10-CM | POA: Diagnosis not present

## 2018-10-09 DIAGNOSIS — N186 End stage renal disease: Secondary | ICD-10-CM | POA: Diagnosis not present

## 2018-10-09 DIAGNOSIS — N2581 Secondary hyperparathyroidism of renal origin: Secondary | ICD-10-CM | POA: Diagnosis not present

## 2018-10-09 DIAGNOSIS — Z23 Encounter for immunization: Secondary | ICD-10-CM | POA: Diagnosis not present

## 2018-10-09 DIAGNOSIS — E1121 Type 2 diabetes mellitus with diabetic nephropathy: Secondary | ICD-10-CM | POA: Diagnosis not present

## 2018-10-11 DIAGNOSIS — Z992 Dependence on renal dialysis: Secondary | ICD-10-CM | POA: Diagnosis not present

## 2018-10-11 DIAGNOSIS — N2581 Secondary hyperparathyroidism of renal origin: Secondary | ICD-10-CM | POA: Diagnosis not present

## 2018-10-11 DIAGNOSIS — D509 Iron deficiency anemia, unspecified: Secondary | ICD-10-CM | POA: Diagnosis not present

## 2018-10-11 DIAGNOSIS — N186 End stage renal disease: Secondary | ICD-10-CM | POA: Diagnosis not present

## 2018-10-11 DIAGNOSIS — M86172 Other acute osteomyelitis, left ankle and foot: Secondary | ICD-10-CM | POA: Diagnosis not present

## 2018-10-11 DIAGNOSIS — E1121 Type 2 diabetes mellitus with diabetic nephropathy: Secondary | ICD-10-CM | POA: Diagnosis not present

## 2018-10-11 DIAGNOSIS — Z23 Encounter for immunization: Secondary | ICD-10-CM | POA: Diagnosis not present

## 2018-10-12 DIAGNOSIS — L97522 Non-pressure chronic ulcer of other part of left foot with fat layer exposed: Secondary | ICD-10-CM | POA: Diagnosis not present

## 2018-10-12 DIAGNOSIS — E1122 Type 2 diabetes mellitus with diabetic chronic kidney disease: Secondary | ICD-10-CM | POA: Diagnosis not present

## 2018-10-12 DIAGNOSIS — L97512 Non-pressure chronic ulcer of other part of right foot with fat layer exposed: Secondary | ICD-10-CM | POA: Diagnosis not present

## 2018-10-12 DIAGNOSIS — E11621 Type 2 diabetes mellitus with foot ulcer: Secondary | ICD-10-CM | POA: Diagnosis not present

## 2018-10-12 DIAGNOSIS — E1169 Type 2 diabetes mellitus with other specified complication: Secondary | ICD-10-CM | POA: Diagnosis not present

## 2018-10-12 DIAGNOSIS — M86372 Chronic multifocal osteomyelitis, left ankle and foot: Secondary | ICD-10-CM | POA: Diagnosis not present

## 2018-10-13 DIAGNOSIS — Z992 Dependence on renal dialysis: Secondary | ICD-10-CM | POA: Diagnosis not present

## 2018-10-13 DIAGNOSIS — N2581 Secondary hyperparathyroidism of renal origin: Secondary | ICD-10-CM | POA: Diagnosis not present

## 2018-10-13 DIAGNOSIS — N186 End stage renal disease: Secondary | ICD-10-CM | POA: Diagnosis not present

## 2018-10-13 DIAGNOSIS — E1121 Type 2 diabetes mellitus with diabetic nephropathy: Secondary | ICD-10-CM | POA: Diagnosis not present

## 2018-10-13 DIAGNOSIS — D509 Iron deficiency anemia, unspecified: Secondary | ICD-10-CM | POA: Diagnosis not present

## 2018-10-13 DIAGNOSIS — Z23 Encounter for immunization: Secondary | ICD-10-CM | POA: Diagnosis not present

## 2018-10-16 DIAGNOSIS — Z992 Dependence on renal dialysis: Secondary | ICD-10-CM | POA: Diagnosis not present

## 2018-10-16 DIAGNOSIS — N186 End stage renal disease: Secondary | ICD-10-CM | POA: Diagnosis not present

## 2018-10-16 DIAGNOSIS — E1121 Type 2 diabetes mellitus with diabetic nephropathy: Secondary | ICD-10-CM | POA: Diagnosis not present

## 2018-10-16 DIAGNOSIS — Z23 Encounter for immunization: Secondary | ICD-10-CM | POA: Diagnosis not present

## 2018-10-16 DIAGNOSIS — N2581 Secondary hyperparathyroidism of renal origin: Secondary | ICD-10-CM | POA: Diagnosis not present

## 2018-10-16 DIAGNOSIS — D509 Iron deficiency anemia, unspecified: Secondary | ICD-10-CM | POA: Diagnosis not present

## 2018-10-17 ENCOUNTER — Encounter: Payer: Self-pay | Admitting: Infectious Diseases

## 2018-10-17 ENCOUNTER — Ambulatory Visit (INDEPENDENT_AMBULATORY_CARE_PROVIDER_SITE_OTHER): Payer: Medicare Other | Admitting: Infectious Diseases

## 2018-10-17 DIAGNOSIS — M86172 Other acute osteomyelitis, left ankle and foot: Secondary | ICD-10-CM

## 2018-10-17 MED ORDER — CIPROFLOXACIN HCL 500 MG PO TABS: 500.0000 mg | ORAL_TABLET | Freq: Every day | ORAL | 1 refills | Status: DC

## 2018-10-17 NOTE — Assessment & Plan Note (Addendum)
We are continuing to try and save his L foot. Will give him 6 weeks of po cipro and see him back at that time.  Asked him to sapce 2h after his other meds.  Appreciate f/u at [facility] and ortho.  His R foot cast will come off in the intervening period.

## 2018-10-17 NOTE — Progress Notes (Signed)
Subjective:    Patient ID: [name] CRABLE Sr., male    DOB: 1950-12-29, 67 y.o.   MRN: [id number]  HPI yo M with hx of DM2, ESRD, CAD, and ulcers on R first metatarsal head; L 5th and L 1st MT heads.  He was seen in ID on 5-21-18with worsening of his R 5th ray and was sent to hospital. His wound Cx was polymicrobial.  On 2-1-19he underwent(by Dr [name]) 1. 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 2. Excisional debridement of plantar wound. 3. Amputation of the left 2nd toe at the interphalangeal joint. 4. Excisional debridement of skin, subcutaneous tissue, and bone at the site of a complex wound, left foot.  He had ID f/u 02-02-18 and was continued on IV anbx with HD for 6 weeks (end date 04-06-18) He had ID f/u on 03-28-18 and was felt to be doing well.   He returned to hospital on 5-15 with sepsis, from his L foot. States he had "pus pockets" that the ED MD lanced. 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 Wound Cx MRSA (Vanco MIC 0.5, S-Doxy, R- bactrim). He was seen by ID and continued on vancomycin post d/c (end date 06-21-18). He had ortho f/u (no plans for further intervention) and vascular who did a PTCA and stent L tibial artery.   Last A1C 6% by pt report (08-2018).  He was seen in [facility] and had bone bx that showed osteo in his L foot, necrotic/edematous soft tissue.  He had bone Cx that showed Enterobacter cloacae (R- ancef) and Klebsiella oxytoca (R-amp).  He was seen in ID on 8-1 and started on ceftaz with HD for 6 weeks.  He was put into solid cast on his R foot for 4 weeks, planned for ? Has it changed every week.  His L foot previous wound has been healing but now has new wound over his previous wound site where he had VAC. This developed a new abscess with tunnel. He had a Cx done which was (-).  Worried wound on his L foot is not healing, draining internally.  He is to have f/u MRI on 08-31-18.  States his Leonides Schanz has  completed on 8-27.  He was seen in ID on 08-29-18 and his ceftaz was continued for 2 more weeks.   He was recently on linezolid after a superfical Cx grew MRSA (R- doxy) at [facility]. He has completed his IV anbx. He has had f/u with Ortho who recommended he be on suppressive anbx (otherwise BKA). He states his last films showed osteo in his 3rd and 4th metatarsals.  He has had some d/c from his wound on his R foot. Has been d/c'ing.  He will have the cast removed from his R foot 10-19-18. He will then go back to a regular shoe on the R.   He has noted BP drops, was started on midodrine by his renal MD. Had episode of syncope one day after riding in car for 30 minutes.   Review of Systems  Constitutional: Negative for chills and fever.  Gastrointestinal: Negative for constipation and diarrhea.  Neurological: Positive for syncope and light-headedness.  Please see HPI. All other systems reviewed and negative.      Objective:   Physical Exam  Constitutional: He appears well-developed and well-nourished.  HENT:  Mouth/Throat: Abnormal dentition. No oropharyngeal exudate.  Eyes: EOM are normal.  Cardiovascular: Normal rate, regular rhythm and normal heart sounds.  Pulmonary/Chest: Effort  normal and breath sounds normal.  Abdominal: Soft. Bowel sounds are normal. He exhibits no distension. There is no tenderness.  Musculoskeletal:       Feet:  Feet:  Left Foot:  Skin Integrity: Positive for ulcer.    Media Information   Document Information   Photos    10/17/2018 14:08  Attached To:  Office Visit on 10/17/18 with Campbell Riches, MD  Source Information   Wolfe Camarena, Doroteo Bradford, MD  [facility]         Assessment & Plan:

## 2018-10-18 DIAGNOSIS — M86172 Other acute osteomyelitis, left ankle and foot: Secondary | ICD-10-CM | POA: Diagnosis not present

## 2018-10-18 DIAGNOSIS — E1121 Type 2 diabetes mellitus with diabetic nephropathy: Secondary | ICD-10-CM | POA: Diagnosis not present

## 2018-10-18 DIAGNOSIS — Z23 Encounter for immunization: Secondary | ICD-10-CM | POA: Diagnosis not present

## 2018-10-18 DIAGNOSIS — N2581 Secondary hyperparathyroidism of renal origin: Secondary | ICD-10-CM | POA: Diagnosis not present

## 2018-10-18 DIAGNOSIS — N186 End stage renal disease: Secondary | ICD-10-CM | POA: Diagnosis not present

## 2018-10-18 DIAGNOSIS — D509 Iron deficiency anemia, unspecified: Secondary | ICD-10-CM | POA: Diagnosis not present

## 2018-10-18 DIAGNOSIS — Z992 Dependence on renal dialysis: Secondary | ICD-10-CM | POA: Diagnosis not present

## 2018-10-19 DIAGNOSIS — L97522 Non-pressure chronic ulcer of other part of left foot with fat layer exposed: Secondary | ICD-10-CM | POA: Diagnosis not present

## 2018-10-19 DIAGNOSIS — L97512 Non-pressure chronic ulcer of other part of right foot with fat layer exposed: Secondary | ICD-10-CM | POA: Diagnosis not present

## 2018-10-19 DIAGNOSIS — E11621 Type 2 diabetes mellitus with foot ulcer: Secondary | ICD-10-CM | POA: Diagnosis not present

## 2018-10-19 DIAGNOSIS — E1169 Type 2 diabetes mellitus with other specified complication: Secondary | ICD-10-CM | POA: Diagnosis not present

## 2018-10-19 DIAGNOSIS — E1122 Type 2 diabetes mellitus with diabetic chronic kidney disease: Secondary | ICD-10-CM | POA: Diagnosis not present

## 2018-10-19 DIAGNOSIS — M86372 Chronic multifocal osteomyelitis, left ankle and foot: Secondary | ICD-10-CM | POA: Diagnosis not present

## 2018-10-20 DIAGNOSIS — Z23 Encounter for immunization: Secondary | ICD-10-CM | POA: Diagnosis not present

## 2018-10-20 DIAGNOSIS — Z992 Dependence on renal dialysis: Secondary | ICD-10-CM | POA: Diagnosis not present

## 2018-10-20 DIAGNOSIS — E1121 Type 2 diabetes mellitus with diabetic nephropathy: Secondary | ICD-10-CM | POA: Diagnosis not present

## 2018-10-20 DIAGNOSIS — D509 Iron deficiency anemia, unspecified: Secondary | ICD-10-CM | POA: Diagnosis not present

## 2018-10-20 DIAGNOSIS — N186 End stage renal disease: Secondary | ICD-10-CM | POA: Diagnosis not present

## 2018-10-20 DIAGNOSIS — N2581 Secondary hyperparathyroidism of renal origin: Secondary | ICD-10-CM | POA: Diagnosis not present

## 2018-10-23 DIAGNOSIS — E1121 Type 2 diabetes mellitus with diabetic nephropathy: Secondary | ICD-10-CM | POA: Diagnosis not present

## 2018-10-23 DIAGNOSIS — N2581 Secondary hyperparathyroidism of renal origin: Secondary | ICD-10-CM | POA: Diagnosis not present

## 2018-10-23 DIAGNOSIS — N186 End stage renal disease: Secondary | ICD-10-CM | POA: Diagnosis not present

## 2018-10-23 DIAGNOSIS — D509 Iron deficiency anemia, unspecified: Secondary | ICD-10-CM | POA: Diagnosis not present

## 2018-10-23 DIAGNOSIS — Z23 Encounter for immunization: Secondary | ICD-10-CM | POA: Diagnosis not present

## 2018-10-23 DIAGNOSIS — Z992 Dependence on renal dialysis: Secondary | ICD-10-CM | POA: Diagnosis not present

## 2018-10-26 ENCOUNTER — Other Ambulatory Visit (HOSPITAL_COMMUNITY)
Admission: RE | Admit: 2018-10-26 | Discharge: 2018-10-26 | Disposition: A | Discharge status: Home / Self Care | Payer: Medicare Other | Source: Other Acute Inpatient Hospital | Attending: Internal Medicine | Admitting: Internal Medicine

## 2018-10-26 DIAGNOSIS — I10 Essential (primary) hypertension: Secondary | ICD-10-CM | POA: Diagnosis not present

## 2018-10-26 DIAGNOSIS — E1122 Type 2 diabetes mellitus with diabetic chronic kidney disease: Secondary | ICD-10-CM | POA: Diagnosis not present

## 2018-10-26 DIAGNOSIS — G250 Essential tremor: Secondary | ICD-10-CM | POA: Diagnosis not present

## 2018-10-26 DIAGNOSIS — L97422 Non-pressure chronic ulcer of left heel and midfoot with fat layer exposed: Secondary | ICD-10-CM | POA: Diagnosis not present

## 2018-10-26 DIAGNOSIS — I951 Orthostatic hypotension: Secondary | ICD-10-CM | POA: Diagnosis not present

## 2018-10-26 DIAGNOSIS — E1169 Type 2 diabetes mellitus with other specified complication: Secondary | ICD-10-CM | POA: Diagnosis not present

## 2018-10-26 DIAGNOSIS — L97524 Non-pressure chronic ulcer of other part of left foot with necrosis of bone: Secondary | ICD-10-CM | POA: Diagnosis not present

## 2018-10-26 DIAGNOSIS — E119 Type 2 diabetes mellitus without complications: Secondary | ICD-10-CM | POA: Diagnosis not present

## 2018-10-26 DIAGNOSIS — L97512 Non-pressure chronic ulcer of other part of right foot with fat layer exposed: Secondary | ICD-10-CM | POA: Diagnosis not present

## 2018-10-26 DIAGNOSIS — M86372 Chronic multifocal osteomyelitis, left ankle and foot: Secondary | ICD-10-CM | POA: Diagnosis not present

## 2018-10-26 DIAGNOSIS — L97522 Non-pressure chronic ulcer of other part of left foot with fat layer exposed: Secondary | ICD-10-CM | POA: Diagnosis not present

## 2018-10-26 DIAGNOSIS — E11621 Type 2 diabetes mellitus with foot ulcer: Secondary | ICD-10-CM | POA: Diagnosis not present

## 2018-10-26 LAB — GLUCOSE, CAPILLARY: Glucose-Capillary: 185 mg/dL — ABNORMAL HIGH (ref 70–99)

## 2018-10-27 DIAGNOSIS — Z992 Dependence on renal dialysis: Secondary | ICD-10-CM | POA: Diagnosis not present

## 2018-10-27 DIAGNOSIS — N2581 Secondary hyperparathyroidism of renal origin: Secondary | ICD-10-CM | POA: Diagnosis not present

## 2018-10-27 DIAGNOSIS — M86172 Other acute osteomyelitis, left ankle and foot: Secondary | ICD-10-CM | POA: Diagnosis not present

## 2018-10-27 DIAGNOSIS — M868X7 Other osteomyelitis, ankle and foot: Secondary | ICD-10-CM | POA: Diagnosis not present

## 2018-10-27 DIAGNOSIS — E1122 Type 2 diabetes mellitus with diabetic chronic kidney disease: Secondary | ICD-10-CM | POA: Diagnosis not present

## 2018-10-27 DIAGNOSIS — N186 End stage renal disease: Secondary | ICD-10-CM | POA: Diagnosis not present

## 2018-10-30 DIAGNOSIS — N2581 Secondary hyperparathyroidism of renal origin: Secondary | ICD-10-CM | POA: Diagnosis not present

## 2018-10-30 DIAGNOSIS — N186 End stage renal disease: Secondary | ICD-10-CM | POA: Diagnosis not present

## 2018-10-30 DIAGNOSIS — M86172 Other acute osteomyelitis, left ankle and foot: Secondary | ICD-10-CM | POA: Diagnosis not present

## 2018-10-30 DIAGNOSIS — M868X7 Other osteomyelitis, ankle and foot: Secondary | ICD-10-CM | POA: Diagnosis not present

## 2018-10-30 LAB — AEROBIC CULTURE W GRAM STAIN (SUPERFICIAL SPECIMEN)

## 2018-10-30 LAB — AEROBIC CULTURE  (SUPERFICIAL SPECIMEN)

## 2018-10-31 ENCOUNTER — Ambulatory Visit: Payer: Medicare Other | Admitting: Infectious Diseases

## 2018-10-31 ENCOUNTER — Encounter: Payer: Self-pay | Admitting: Cardiology

## 2018-10-31 ENCOUNTER — Ambulatory Visit (INDEPENDENT_AMBULATORY_CARE_PROVIDER_SITE_OTHER): Payer: Medicare Other | Admitting: Cardiology

## 2018-10-31 DIAGNOSIS — I420 Dilated cardiomyopathy: Secondary | ICD-10-CM | POA: Diagnosis not present

## 2018-10-31 DIAGNOSIS — Z0389 Encounter for observation for other suspected diseases and conditions ruled out: Secondary | ICD-10-CM | POA: Diagnosis not present

## 2018-10-31 DIAGNOSIS — I739 Peripheral vascular disease, unspecified: Secondary | ICD-10-CM | POA: Diagnosis not present

## 2018-10-31 DIAGNOSIS — Z794 Long term (current) use of insulin: Secondary | ICD-10-CM

## 2018-10-31 DIAGNOSIS — IMO0001 Reserved for inherently not codable concepts without codable children: Secondary | ICD-10-CM

## 2018-10-31 DIAGNOSIS — R55 Syncope and collapse: Secondary | ICD-10-CM

## 2018-10-31 DIAGNOSIS — N186 End stage renal disease: Secondary | ICD-10-CM | POA: Diagnosis not present

## 2018-10-31 DIAGNOSIS — Z992 Dependence on renal dialysis: Secondary | ICD-10-CM | POA: Diagnosis not present

## 2018-10-31 DIAGNOSIS — I10 Essential (primary) hypertension: Secondary | ICD-10-CM | POA: Diagnosis not present

## 2018-10-31 DIAGNOSIS — E1121 Type 2 diabetes mellitus with diabetic nephropathy: Secondary | ICD-10-CM | POA: Diagnosis not present

## 2018-10-31 MED ORDER — MIDODRINE HCL 10 MG PO TABS: 10.0000 mg | ORAL_TABLET | Freq: Three times a day (TID) | ORAL | 6 refills | Status: DC

## 2018-10-31 NOTE — Assessment & Plan Note (Addendum)
Pt has had episodes of orthostatic syncope and collapse.  He has been placed on Midodrine and dose increased.  Reviewed with dr Debara Pickett in the office today. 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 Chronic foot infection and possible sepsis may be contributing.

## 2018-10-31 NOTE — Patient Instructions (Addendum)
Medication Instructions:  INCREASE Midodrine 10mg  take 1 tablet 3 times a day If you need a refill on your cardiac medications before your next appointment, please call your pharmacy.   Lab work: None  If you have labs (blood work) drawn today and your tests are completely normal, you will receive your results only by: Marland Kitchen MyChart Message (if you have MyChart) OR . A paper copy in the mail If you have any lab test that is abnormal or we need to change your treatment, we will call you to review the results.  Testing/Procedures: None   Follow-Up: At [facility], you and your health needs are our priority.  As part of our continuing mission to provide you with exceptional heart care, we have created designated Provider Care Teams.  These Care Teams include your primary Cardiologist (physician) and Advanced Practice Providers (APPs -  Physician Assistants and Nurse Practitioners) who all work together to provide you with the care you need, when you need it. . Your physician recommends that you schedule a follow-up appointment ON A AS NEEDED BASIS.  Any Other Special Instructions Will Be Listed Below (If Applicable).

## 2018-10-31 NOTE — Progress Notes (Signed)
10/31/2018 [name] Sages Sr.   Jul 06, 1951  [id number]  Primary Physician [name], MD Primary Cardiologist: Dr Stanford Breed  HPI: [name] is a 67 year old male followed by Dr. Stanford Breed ([place] 2016) with multiple medical problems.  He has end-stage renal disease and is on dialysis.  He has insulin-dependent diabetes.  He has a history of HTN but recently has been hypotensive.   He has treated dyslipidemia.  He had a past history of a nonischemic cardiomyopathy in 2016 (normal coronaries in Nov 2016) although in March 2019 his echocardiogram showed his EF was 50 to 55% with basilar hypertrophy and moderate LVH.    He has significant peripheral vascular disease and has had nonhealing ulcers on both feet. He has been followed by Dr [name] and at the wound center.  In May 2019 he underwent left tibioperoneal trunk angioplasty by Dr. Donzetta Matters.  He has followed by Dr. Doren Custard in June and was doing better as far as his foot wounds.  Apparently his left foot wound was healing but is now taken another turn for the worst.  He saw Dr. [name] with the infectious disease service 10/17/18.  At this point he will be given a round of antibiotics as an effort to save his left foot.    He is referred to [place] today by Dr Marval Regal for episodes of orthostatic syncope and near syncope. Initially these episodes were post dialysis but now it happens on non dialysis days as well.  He can tell he is going out and has only collapsed once.  He denies any tachycardia.    Current Outpatient Medications  Medication Sig Dispense Refill  . acetaminophen (TYLENOL) 325 MG tablet Take 650 mg by mouth every 6 (six) hours as needed (pain).     Marland Kitchen atorvastatin (LIPITOR) 20 MG tablet Take 20 mg by mouth at bedtime.     . ciprofloxacin (CIPRO) 500 MG tablet Take 1 tablet (500 mg total) by mouth daily. 42 tablet 1  . clopidogrel (PLAVIX) 75 MG tablet Take 1 tablet (75 mg total) by mouth daily. 30 tablet 11  . ferric citrate (AURYXIA) 1  GM 210 MG(Fe) tablet Take 420 mg by mouth 3 (three) times daily with meals.     Marland Kitchen glucose monitoring kit (FREESTYLE) monitoring kit 1 each by Does not apply route 4 (four) times daily - after meals and at bedtime. 1 month Diabetic Testing Supplies for QAC-QHS accuchecks.Any brand OK 1 each 1  . HYDROcodone-acetaminophen (NORCO/VICODIN) 5-325 MG tablet Take 1 tablet by mouth every 6 (six) hours as needed for moderate pain. 30 tablet 0  . ibuprofen (ADVIL,MOTRIN) 200 MG tablet Take 400 mg by mouth as needed for moderate pain.    Marland Kitchen insulin NPH-regular Human (NOVOLIN 70/30) (70-30) 100 UNIT/ML injection Inject 1-5 Units into the skin 2 (two) times daily with a meal. Sliding Scale    . Insulin Syringe-Needle U-100 25G X 1" 1 ML MISC Any brand, for 4 times a day insulin SQ, 1 month supply. 30 each 0  . midodrine (PROAMATINE) 10 MG tablet Take 1 tablet (10 mg total) by mouth 3 (three) times daily with meals. 90 tablet 6  . Multiple Vitamins-Minerals (MULTIVITAMIN WITH MINERALS) tablet Take 1 tablet by mouth daily.    . mupirocin ointment (BACTROBAN) 2 % Apply 1 application topically daily as needed (rash on stomach).   0  . primidone (MYSOLINE) 50 MG tablet Take 0.5 tablets (25 mg total) by mouth at bedtime. 30 tablet 0  .  timolol (TIMOPTIC) 0.5 % ophthalmic solution Place 1 drop into the left eye 2 (two) times daily.    . vitamin C (ASCORBIC ACID) 500 MG tablet Take 500 mg by mouth 2 (two) times a week.     No current facility-administered medications for this visit.     Allergies  Allergen Reactions  . Penicillins Hives, Swelling and Other (See Comments)    Ankle swelling  PATIENT HAD A PCN REACTION WITH IMMEDIATE RASH, FACIAL/TONGUE/THROAT SWELLING, SOB, OR LIGHTHEADEDNESS WITH HYPOTENSION:  #  #  #  YES  #  #  #   Has patient had a PCN reaction causing severe rash involving mucus membranes or skin necrosis: no Has patient had a PCN reaction that required hospitalization: no Has patient had a PCN  reaction occurring within the last 10 years: no If all of the above answers are "NO", then may proceed with Cephalosporin use.    Past Medical History:  Diagnosis Date  . Cardiomyopathy ([place])   . Cataract    right eye  . Diabetes mellitus    type 2  . Diabetic foot ulcer ([place])   . Diabetic infection of right foot ([place])   . Diabetic peripheral neuropathy ([place])   . ESRD (end stage renal disease) ([place])    m-w-f [facility] [address]  . Glaucoma    left eye  . Headache   . HTN (hypertension)   . Osteomyelitis ([place])   . Peripheral vascular disease ([place])    critical limb ischemia    Social History   Socioeconomic History  . Marital status: Married    Spouse name: [name]  . Number of children: 3  . Years of education: 51  . Highest education level: Not on file  Occupational History  . Occupation: Printmaker: [employer]  Social Needs  . Financial resource strain: Not on file  . Food insecurity:    Worry: Not on file    Inability: Not on file  . Transportation needs:    Medical: Not on file    Non-medical: Not on file  Tobacco Use  . Smoking status: Former Smoker    Packs/day: 0.50    Years: 4.00    Pack years: 2.00    Types: Cigarettes  . Smokeless tobacco: Never Used  . Tobacco comment: about 40 yrs ago  Substance and Sexual Activity  . Alcohol use: No    Alcohol/week: 0.0 standard drinks  . Drug use: No  . Sexual activity: Yes  Lifestyle  . Physical activity:    Days per week: Not on file    Minutes per session: Not on file  . Stress: Not on file  Relationships  . Social connections:    Talks on phone: Not on file    Gets together: Not on file    Attends religious service: Not on file    Active member of club or organization: Not on file    Attends meetings of clubs or organizations: Not on file    Relationship status: Not on file  . Intimate partner violence:    Fear of current or ex partner: Not on file     Emotionally abused: Not on file    Physically abused: Not on file    Forced sexual activity: Not on file  Other Topics Concern  . Not on file  Social History Narrative  . Not on file     Family History  Problem Relation Age  of Onset  . Heart disease Mother   . Diabetes Father   . Diabetes Brother   . Diabetes Sister   . Diabetes Brother   . Diabetes Brother      Review of Systems: General: negative for chills, fever, night sweats or weight changes.  Cardiovascular: negative for chest pain, dyspnea on exertion, edema, orthopnea, palpitations, paroxysmal nocturnal dyspnea or shortness of breath Dermatological: negative for rash Respiratory: negative for cough or wheezing Urologic: negative for hematuria Abdominal: negative for nausea, vomiting, diarrhea, bright red blood per rectum, melena, or hematemesis Neurologic: negative for visual changes All other systems reviewed and are otherwise negative except as noted above.    Blood pressure 108/64, pulse (!) 55, resp. rate 16, height '6\' 1"'  (1.854 m), weight 213 lb 13.5 oz (97 kg), SpO2 98 %.  General appearance: alert, cooperative and no distress Neck: no carotid bruit and no JVD Lungs: clear to auscultation bilaterally Heart: regular rate and rhythm Neurologic: Grossly normal  EKG NSR, incomplete LBBB, QTc 509  ASSESSMENT AND PLAN:   Congestive dilated cardiomyopathy ([place]) Echo in 2015 showed an EF of 20-25%- His most recent echo March 2019 showed his EF to be 55-60% with severe bailar hypertrophy and moderate hypertrophy in the remaining myocardium. 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  He has been placed on Midodrine and dose increased.  Reviewed with dr Debara Pickett in the office today. 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 Chronic foot infection and possible sepsis may be contributing.    PLAN  Reviewed with our pharmacist and with Dr Debara Pickett. For now will plan to increase Midodrine to 10 mg TID. We could consider Northera if this doesn't work but its cost may be prohibitive.  We'll see him back if this adjustment is unsuccessful.   Kerin Ransom PA-C 10/31/2018 3:36 PM

## 2018-10-31 NOTE — Assessment & Plan Note (Signed)
Echo in 2015 showed an EF of 20-25%- His most recent echo March 2019 showed his EF to be 55-60% with severe bailar hypertrophy and moderate hypertrophy in the remaining myocardium. Grade 1 DD

## 2018-10-31 NOTE — Assessment & Plan Note (Signed)
Cath Nov 2015

## 2018-10-31 NOTE — Assessment & Plan Note (Signed)
ESRD on HD- Dr Marval Regal follows

## 2018-10-31 NOTE — Assessment & Plan Note (Addendum)
Tibial -peroneal trunk angioplasty May 2019- Dr Donzetta Matters Bilateral foot wounds- followed at the wound center

## 2018-10-31 NOTE — Assessment & Plan Note (Signed)
H/O HTN- now hypotensive requiring Midodrine

## 2018-10-31 NOTE — Assessment & Plan Note (Signed)
IDDM with nephropathy and neuropathy

## 2018-11-01 DIAGNOSIS — N2581 Secondary hyperparathyroidism of renal origin: Secondary | ICD-10-CM | POA: Diagnosis not present

## 2018-11-01 DIAGNOSIS — M86172 Other acute osteomyelitis, left ankle and foot: Secondary | ICD-10-CM | POA: Diagnosis not present

## 2018-11-01 DIAGNOSIS — N186 End stage renal disease: Secondary | ICD-10-CM | POA: Diagnosis not present

## 2018-11-01 DIAGNOSIS — M868X7 Other osteomyelitis, ankle and foot: Secondary | ICD-10-CM | POA: Diagnosis not present

## 2018-11-02 ENCOUNTER — Encounter (HOSPITAL_BASED_OUTPATIENT_CLINIC_OR_DEPARTMENT_OTHER): Payer: Medicare Other | Attending: Internal Medicine

## 2018-11-02 DIAGNOSIS — E1169 Type 2 diabetes mellitus with other specified complication: Secondary | ICD-10-CM | POA: Insufficient documentation

## 2018-11-02 DIAGNOSIS — E1140 Type 2 diabetes mellitus with diabetic neuropathy, unspecified: Secondary | ICD-10-CM | POA: Insufficient documentation

## 2018-11-02 DIAGNOSIS — I120 Hypertensive chronic kidney disease with stage 5 chronic kidney disease or end stage renal disease: Secondary | ICD-10-CM | POA: Diagnosis not present

## 2018-11-02 DIAGNOSIS — L97522 Non-pressure chronic ulcer of other part of left foot with fat layer exposed: Secondary | ICD-10-CM | POA: Insufficient documentation

## 2018-11-02 DIAGNOSIS — L97422 Non-pressure chronic ulcer of left heel and midfoot with fat layer exposed: Secondary | ICD-10-CM | POA: Diagnosis not present

## 2018-11-02 DIAGNOSIS — E11621 Type 2 diabetes mellitus with foot ulcer: Secondary | ICD-10-CM | POA: Insufficient documentation

## 2018-11-02 DIAGNOSIS — L97512 Non-pressure chronic ulcer of other part of right foot with fat layer exposed: Secondary | ICD-10-CM | POA: Insufficient documentation

## 2018-11-02 DIAGNOSIS — Z992 Dependence on renal dialysis: Secondary | ICD-10-CM | POA: Diagnosis not present

## 2018-11-02 DIAGNOSIS — E1122 Type 2 diabetes mellitus with diabetic chronic kidney disease: Secondary | ICD-10-CM | POA: Diagnosis not present

## 2018-11-02 DIAGNOSIS — B9562 Methicillin resistant Staphylococcus aureus infection as the cause of diseases classified elsewhere: Secondary | ICD-10-CM | POA: Diagnosis not present

## 2018-11-02 DIAGNOSIS — N185 Chronic kidney disease, stage 5: Secondary | ICD-10-CM | POA: Insufficient documentation

## 2018-11-02 DIAGNOSIS — M86372 Chronic multifocal osteomyelitis, left ankle and foot: Secondary | ICD-10-CM | POA: Diagnosis not present

## 2018-11-02 DIAGNOSIS — R42 Dizziness and giddiness: Secondary | ICD-10-CM | POA: Diagnosis not present

## 2018-11-03 DIAGNOSIS — M86172 Other acute osteomyelitis, left ankle and foot: Secondary | ICD-10-CM | POA: Diagnosis not present

## 2018-11-03 DIAGNOSIS — N2581 Secondary hyperparathyroidism of renal origin: Secondary | ICD-10-CM | POA: Diagnosis not present

## 2018-11-03 DIAGNOSIS — N186 End stage renal disease: Secondary | ICD-10-CM | POA: Diagnosis not present

## 2018-11-03 DIAGNOSIS — M868X7 Other osteomyelitis, ankle and foot: Secondary | ICD-10-CM | POA: Diagnosis not present

## 2018-11-06 DIAGNOSIS — N186 End stage renal disease: Secondary | ICD-10-CM | POA: Diagnosis not present

## 2018-11-06 DIAGNOSIS — M86172 Other acute osteomyelitis, left ankle and foot: Secondary | ICD-10-CM | POA: Diagnosis not present

## 2018-11-06 DIAGNOSIS — N2581 Secondary hyperparathyroidism of renal origin: Secondary | ICD-10-CM | POA: Diagnosis not present

## 2018-11-06 DIAGNOSIS — M868X7 Other osteomyelitis, ankle and foot: Secondary | ICD-10-CM | POA: Diagnosis not present

## 2018-11-08 ENCOUNTER — Ambulatory Visit (INDEPENDENT_AMBULATORY_CARE_PROVIDER_SITE_OTHER): Payer: Medicare Other | Admitting: Vascular Surgery

## 2018-11-08 ENCOUNTER — Ambulatory Visit (INDEPENDENT_AMBULATORY_CARE_PROVIDER_SITE_OTHER)
Admission: RE | Admit: 2018-11-08 | Discharge: 2018-11-08 | Disposition: A | Discharge status: Home / Self Care | Payer: Medicare Other | Source: Ambulatory Visit | Attending: Vascular Surgery | Admitting: Vascular Surgery

## 2018-11-08 ENCOUNTER — Encounter: Payer: Self-pay | Admitting: Vascular Surgery

## 2018-11-08 ENCOUNTER — Other Ambulatory Visit: Payer: Self-pay

## 2018-11-08 ENCOUNTER — Ambulatory Visit (HOSPITAL_COMMUNITY)
Admission: RE | Admit: 2018-11-08 | Discharge: 2018-11-08 | Disposition: A | Discharge status: Home / Self Care | Payer: Medicare Other | Source: Ambulatory Visit | Attending: Vascular Surgery | Admitting: Vascular Surgery

## 2018-11-08 VITALS — BP 132/72 | HR 88 | Temp 98.9°F | Resp 16 | Ht 73.0 in | Wt 219.0 lb

## 2018-11-08 DIAGNOSIS — N2581 Secondary hyperparathyroidism of renal origin: Secondary | ICD-10-CM | POA: Diagnosis not present

## 2018-11-08 DIAGNOSIS — I739 Peripheral vascular disease, unspecified: Secondary | ICD-10-CM | POA: Diagnosis not present

## 2018-11-08 DIAGNOSIS — N186 End stage renal disease: Secondary | ICD-10-CM | POA: Diagnosis not present

## 2018-11-08 DIAGNOSIS — M868X7 Other osteomyelitis, ankle and foot: Secondary | ICD-10-CM | POA: Diagnosis not present

## 2018-11-08 DIAGNOSIS — M86172 Other acute osteomyelitis, left ankle and foot: Secondary | ICD-10-CM | POA: Diagnosis not present

## 2018-11-08 NOTE — Progress Notes (Signed)
Patient name: [name] WINTERTON Sr. MRN: [id number] DOB: 1951-06-11 Sex: male  REASON FOR VISIT:   Follow-up of peripheral vascular disease.  HPI:   [name] TORPEY Sr. is a pleasant 67 y.o. male who underwent angioplasty and stenting of the left tibial peroneal trunk and posterior tibial artery by Dr. Adele Barthel on 05/11/2018.  This was for a nonhealing wound of the left foot.  I last saw the patient on 06/07/2018.  At that time there was excellent blood flow in the left foot and I felt that the circulation was as good as we can get it.  He is not a smoker.  His wounds were followed in the wound care center.  I ordered a duplex of his left lower extremity and ABIs in 3 months and he returns for that visit.  Since I saw him last he continues to be followed at the wound care center at [facility].  He has a cast on the right foot which is changed once a week.  This is to offload pressure from the wound on the right foot.  He has osteomyelitis in the left foot and according to the patient this wound is not improving.  He has a dressing on and would not allow [place] to remove this today.  He denies claudication or rest pain.  He is not a smoker.  He is on Plavix which she has been on for 6 months now.  He is also on a statin.  Past Medical History:  Diagnosis Date  . Cardiomyopathy ([place])   . Cataract    right eye  . Diabetes mellitus    type 2  . Diabetic foot ulcer ([place])   . Diabetic infection of right foot ([place])   . Diabetic peripheral neuropathy ([place])   . ESRD (end stage renal disease) ([place])    m-w-f [facility] [place] [address]  . Glaucoma    left eye  . Headache   . HTN (hypertension)   . Osteomyelitis ([place])   . Peripheral vascular disease (HCC)    critical limb ischemia    Family History  Problem Relation Age of Onset  . Heart disease Mother   . Diabetes Father   . Diabetes Brother   . Diabetes Sister   . Diabetes Brother   . Diabetes Brother     SOCIAL  HISTORY: Social History   Tobacco Use  . Smoking status: Former Smoker    Packs/day: 0.50    Years: 4.00    Pack years: 2.00    Types: Cigarettes  . Smokeless tobacco: Never Used  . Tobacco comment: about 40 yrs ago  Substance Use Topics  . Alcohol use: No    Alcohol/week: 0.0 standard drinks    Allergies  Allergen Reactions  . Penicillins Hives, Swelling and Other (See Comments)    Ankle swelling  PATIENT HAD A PCN REACTION WITH IMMEDIATE RASH, FACIAL/TONGUE/THROAT SWELLING, SOB, OR LIGHTHEADEDNESS WITH HYPOTENSION:  #  #  #  YES  #  #  #   Has patient had a PCN reaction causing severe rash involving mucus membranes or skin necrosis: no Has patient had a PCN reaction that required hospitalization: no Has patient had a PCN reaction occurring within the last 10 years: no If all of the above answers are "NO", then may proceed with Cephalosporin use.    Current Outpatient Medications  Medication Sig Dispense Refill  . acetaminophen (TYLENOL) 325 MG tablet Take 650 mg by mouth every  6 (six) hours as needed (pain).     Marland Kitchen atorvastatin (LIPITOR) 20 MG tablet Take 20 mg by mouth at bedtime.     . ciprofloxacin (CIPRO) 500 MG tablet Take 1 tablet (500 mg total) by mouth daily. 42 tablet 1  . clopidogrel (PLAVIX) 75 MG tablet Take 1 tablet (75 mg total) by mouth daily. 30 tablet 11  . ferric citrate (AURYXIA) 1 GM 210 MG(Fe) tablet Take 420 mg by mouth 3 (three) times daily with meals.     Marland Kitchen glucose monitoring kit (FREESTYLE) monitoring kit 1 each by Does not apply route 4 (four) times daily - after meals and at bedtime. 1 month Diabetic Testing Supplies for QAC-QHS accuchecks.Any brand OK 1 each 1  . HYDROcodone-acetaminophen (NORCO/VICODIN) 5-325 MG tablet Take 1 tablet by mouth every 6 (six) hours as needed for moderate pain. 30 tablet 0  . ibuprofen (ADVIL,MOTRIN) 200 MG tablet Take 400 mg by mouth as needed for moderate pain.    Marland Kitchen insulin NPH-regular Human (NOVOLIN 70/30) (70-30) 100  UNIT/ML injection Inject 1-5 Units into the skin 2 (two) times daily with a meal. Sliding Scale    . Insulin Syringe-Needle U-100 25G X 1" 1 ML MISC Any brand, for 4 times a day insulin SQ, 1 month supply. 30 each 0  . midodrine (PROAMATINE) 10 MG tablet Take 1 tablet (10 mg total) by mouth 3 (three) times daily with meals. 90 tablet 6  . Multiple Vitamins-Minerals (MULTIVITAMIN WITH MINERALS) tablet Take 1 tablet by mouth daily.    . mupirocin ointment (BACTROBAN) 2 % Apply 1 application topically daily as needed (rash on stomach).   0  . primidone (MYSOLINE) 50 MG tablet Take 0.5 tablets (25 mg total) by mouth at bedtime. 30 tablet 0  . timolol (TIMOPTIC) 0.5 % ophthalmic solution Place 1 drop into the left eye 2 (two) times daily.    . vitamin C (ASCORBIC ACID) 500 MG tablet Take 500 mg by mouth 2 (two) times a week.     No current facility-administered medications for this visit.     REVIEW OF SYSTEMS:  '[X]'  denotes positive finding, '[ ]'  denotes negative finding Cardiac  Comments:  Chest pain or chest pressure:    Shortness of breath upon exertion:    Short of breath when lying flat:    Irregular heart rhythm:        Vascular    Pain in calf, thigh, or hip brought on by ambulation:    Pain in feet at night that wakes you up from your sleep:     Blood clot in your veins:    Leg swelling:         Pulmonary    Oxygen at home:    Productive cough:     Wheezing:         Neurologic    Sudden weakness in arms or legs:     Sudden numbness in arms or legs:     Sudden onset of difficulty speaking or slurred speech:    Temporary loss of vision in one eye:     Problems with dizziness:         Gastrointestinal    Blood in stool:     Vomited blood:         Genitourinary    Burning when urinating:     Blood in urine:        Psychiatric    Major depression:  Hematologic    Bleeding problems:    Problems with blood clotting too easily:        Skin    Rashes or ulcers:         Constitutional    Fever or chills:     PHYSICAL EXAM:   Vitals:   11/08/18 0941  BP: 132/72  Pulse: 88  Resp: 16  Temp: 98.9 F (37.2 C)  TempSrc: Oral  SpO2: 98%  Weight: 219 lb (99.3 kg)  Height: '6\' 1"'  (1.854 m)    GENERAL: The patient is a well-nourished male, in no acute distress. The vital signs are documented above. CARDIAC: There is a regular rate and rhythm.  VASCULAR: I do not detect carotid bruits. He has palpable femoral and popliteal pulses bilaterally.  I cannot assess his pedal pulses because of the dressings on his feet. PULMONARY: There is good air exchange bilaterally without wheezing or rales. NEUROLOGIC: No focal weakness or paresthesias are detected. SKIN: I am unable to assess because of the dressings on his feet.  He did not want these removed. PSYCHIATRIC: The patient has a normal affect.  DATA:    DUPLEX LEFT LOWER EXTREMITY: I have independently interpreted his duplex of the left lower extremity.  This shows that the arteries are patent from the common femoral artery through the posterior tibial artery on the left.  The peroneal artery is occluded.  The anterior tibial artery and dorsalis pedis arteries are patent.  There are no areas of significantly elevated velocities throughout the left lower extremity.  ARTERIAL DOPPLER STUDY: I have independently interpreted his arterial Doppler study today.  On the right side he has bandages and refused to have these dressings removed so we were unable to perform ABIs on the right side.  On the left side he has a biphasic posterior tibial signal and dorsalis pedis signal.  ABI today is 100%.    MEDICAL ISSUES:   STATUS POST LEFT TIBIAL ANGIOPLASTY: This patient underwent successful tibial angioplasty of the left lower extremity in May 2019 by Dr. Adele Barthel.  Based on his duplex scan today and arterial Doppler study has excellent flow in the left side.  Unfortunately the wound according to the patient  has not been improving secondary to osteomyelitis.  He could potentially require amputation and he is followed by Dr. Dorna Leitz.  He is on Plavix which she is been on for 6 months.  I would continue this for another 6 months if possible.  If then if this needs to be stopped for any procedures then certainly that would be fine.  I will probably convert him to aspirin from Plavix when I see him back in 6 months.  Of ordered follow-up studies at that time.  He knows to call sooner if he has problems.  Deitra Mayo [facility] [phone]

## 2018-11-09 ENCOUNTER — Other Ambulatory Visit (HOSPITAL_COMMUNITY)
Admission: RE | Admit: 2018-11-09 | Discharge: 2018-11-09 | Disposition: A | Discharge status: Home / Self Care | Payer: Medicare Other | Source: Other Acute Inpatient Hospital | Attending: Internal Medicine | Admitting: Internal Medicine

## 2018-11-09 DIAGNOSIS — E11621 Type 2 diabetes mellitus with foot ulcer: Secondary | ICD-10-CM | POA: Diagnosis not present

## 2018-11-09 DIAGNOSIS — L97512 Non-pressure chronic ulcer of other part of right foot with fat layer exposed: Secondary | ICD-10-CM | POA: Insufficient documentation

## 2018-11-09 DIAGNOSIS — M86372 Chronic multifocal osteomyelitis, left ankle and foot: Secondary | ICD-10-CM | POA: Diagnosis not present

## 2018-11-09 DIAGNOSIS — L97522 Non-pressure chronic ulcer of other part of left foot with fat layer exposed: Secondary | ICD-10-CM | POA: Diagnosis not present

## 2018-11-09 DIAGNOSIS — L97422 Non-pressure chronic ulcer of left heel and midfoot with fat layer exposed: Secondary | ICD-10-CM | POA: Diagnosis not present

## 2018-11-09 DIAGNOSIS — E1169 Type 2 diabetes mellitus with other specified complication: Secondary | ICD-10-CM | POA: Diagnosis not present

## 2018-11-10 DIAGNOSIS — M86172 Other acute osteomyelitis, left ankle and foot: Secondary | ICD-10-CM | POA: Diagnosis not present

## 2018-11-10 DIAGNOSIS — N186 End stage renal disease: Secondary | ICD-10-CM | POA: Diagnosis not present

## 2018-11-10 DIAGNOSIS — M868X7 Other osteomyelitis, ankle and foot: Secondary | ICD-10-CM | POA: Diagnosis not present

## 2018-11-10 DIAGNOSIS — N2581 Secondary hyperparathyroidism of renal origin: Secondary | ICD-10-CM | POA: Diagnosis not present

## 2018-11-12 LAB — AEROBIC CULTURE  (SUPERFICIAL SPECIMEN)

## 2018-11-12 LAB — AEROBIC CULTURE W GRAM STAIN (SUPERFICIAL SPECIMEN)

## 2018-11-14 DIAGNOSIS — N2581 Secondary hyperparathyroidism of renal origin: Secondary | ICD-10-CM | POA: Diagnosis not present

## 2018-11-14 DIAGNOSIS — M868X7 Other osteomyelitis, ankle and foot: Secondary | ICD-10-CM | POA: Diagnosis not present

## 2018-11-14 DIAGNOSIS — M86172 Other acute osteomyelitis, left ankle and foot: Secondary | ICD-10-CM | POA: Diagnosis not present

## 2018-11-14 DIAGNOSIS — N186 End stage renal disease: Secondary | ICD-10-CM | POA: Diagnosis not present

## 2018-11-15 DIAGNOSIS — M868X7 Other osteomyelitis, ankle and foot: Secondary | ICD-10-CM | POA: Diagnosis not present

## 2018-11-15 DIAGNOSIS — M869 Osteomyelitis, unspecified: Secondary | ICD-10-CM | POA: Diagnosis not present

## 2018-11-15 DIAGNOSIS — M86172 Other acute osteomyelitis, left ankle and foot: Secondary | ICD-10-CM | POA: Diagnosis not present

## 2018-11-15 DIAGNOSIS — N186 End stage renal disease: Secondary | ICD-10-CM | POA: Diagnosis not present

## 2018-11-15 DIAGNOSIS — N2581 Secondary hyperparathyroidism of renal origin: Secondary | ICD-10-CM | POA: Diagnosis not present

## 2018-11-16 DIAGNOSIS — L97522 Non-pressure chronic ulcer of other part of left foot with fat layer exposed: Secondary | ICD-10-CM | POA: Diagnosis not present

## 2018-11-16 DIAGNOSIS — E11621 Type 2 diabetes mellitus with foot ulcer: Secondary | ICD-10-CM | POA: Diagnosis not present

## 2018-11-16 DIAGNOSIS — L97512 Non-pressure chronic ulcer of other part of right foot with fat layer exposed: Secondary | ICD-10-CM | POA: Diagnosis not present

## 2018-11-16 DIAGNOSIS — M86672 Other chronic osteomyelitis, left ankle and foot: Secondary | ICD-10-CM | POA: Diagnosis not present

## 2018-11-17 ENCOUNTER — Inpatient Hospital Stay (HOSPITAL_COMMUNITY)
Admission: EM | Admit: 2018-11-17 | Discharge: 2018-11-22 | DRG: 853 | Disposition: A | Discharge status: Rehabilitation Facility | Payer: Medicare Other | Attending: Internal Medicine | Admitting: Internal Medicine

## 2018-11-17 ENCOUNTER — Encounter (HOSPITAL_COMMUNITY): Payer: Self-pay | Admitting: Emergency Medicine

## 2018-11-17 ENCOUNTER — Emergency Department (HOSPITAL_COMMUNITY): Payer: Medicare Other

## 2018-11-17 DIAGNOSIS — A419 Sepsis, unspecified organism: Secondary | ICD-10-CM | POA: Diagnosis not present

## 2018-11-17 DIAGNOSIS — Z89421 Acquired absence of other right toe(s): Secondary | ICD-10-CM

## 2018-11-17 DIAGNOSIS — R7881 Bacteremia: Secondary | ICD-10-CM

## 2018-11-17 DIAGNOSIS — E08621 Diabetes mellitus due to underlying condition with foot ulcer: Secondary | ICD-10-CM

## 2018-11-17 DIAGNOSIS — E11621 Type 2 diabetes mellitus with foot ulcer: Secondary | ICD-10-CM | POA: Diagnosis present

## 2018-11-17 DIAGNOSIS — Z794 Long term (current) use of insulin: Secondary | ICD-10-CM | POA: Diagnosis not present

## 2018-11-17 DIAGNOSIS — Z79899 Other long term (current) drug therapy: Secondary | ICD-10-CM

## 2018-11-17 DIAGNOSIS — I70202 Unspecified atherosclerosis of native arteries of extremities, left leg: Secondary | ICD-10-CM | POA: Diagnosis not present

## 2018-11-17 DIAGNOSIS — Z992 Dependence on renal dialysis: Secondary | ICD-10-CM | POA: Diagnosis not present

## 2018-11-17 DIAGNOSIS — R030 Elevated blood-pressure reading, without diagnosis of hypertension: Secondary | ICD-10-CM | POA: Diagnosis not present

## 2018-11-17 DIAGNOSIS — L97423 Non-pressure chronic ulcer of left heel and midfoot with necrosis of muscle: Secondary | ICD-10-CM

## 2018-11-17 DIAGNOSIS — E1122 Type 2 diabetes mellitus with diabetic chronic kidney disease: Secondary | ICD-10-CM | POA: Diagnosis present

## 2018-11-17 DIAGNOSIS — I120 Hypertensive chronic kidney disease with stage 5 chronic kidney disease or end stage renal disease: Secondary | ICD-10-CM | POA: Diagnosis present

## 2018-11-17 DIAGNOSIS — Z9842 Cataract extraction status, left eye: Secondary | ICD-10-CM | POA: Diagnosis not present

## 2018-11-17 DIAGNOSIS — Z4781 Encounter for orthopedic aftercare following surgical amputation: Secondary | ICD-10-CM | POA: Diagnosis present

## 2018-11-17 DIAGNOSIS — M868X7 Other osteomyelitis, ankle and foot: Secondary | ICD-10-CM | POA: Diagnosis not present

## 2018-11-17 DIAGNOSIS — Z6829 Body mass index (BMI) 29.0-29.9, adult: Secondary | ICD-10-CM | POA: Diagnosis not present

## 2018-11-17 DIAGNOSIS — E1169 Type 2 diabetes mellitus with other specified complication: Secondary | ICD-10-CM | POA: Diagnosis present

## 2018-11-17 DIAGNOSIS — L97923 Non-pressure chronic ulcer of unspecified part of left lower leg with necrosis of muscle: Secondary | ICD-10-CM | POA: Diagnosis present

## 2018-11-17 DIAGNOSIS — I10 Essential (primary) hypertension: Secondary | ICD-10-CM | POA: Diagnosis present

## 2018-11-17 DIAGNOSIS — M86672 Other chronic osteomyelitis, left ankle and foot: Secondary | ICD-10-CM | POA: Diagnosis present

## 2018-11-17 DIAGNOSIS — Z95820 Peripheral vascular angioplasty status with implants and grafts: Secondary | ICD-10-CM

## 2018-11-17 DIAGNOSIS — H409 Unspecified glaucoma: Secondary | ICD-10-CM | POA: Diagnosis present

## 2018-11-17 DIAGNOSIS — D631 Anemia in chronic kidney disease: Secondary | ICD-10-CM | POA: Diagnosis present

## 2018-11-17 DIAGNOSIS — E119 Type 2 diabetes mellitus without complications: Secondary | ICD-10-CM | POA: Diagnosis not present

## 2018-11-17 DIAGNOSIS — Z7902 Long term (current) use of antithrombotics/antiplatelets: Secondary | ICD-10-CM | POA: Diagnosis not present

## 2018-11-17 DIAGNOSIS — S88112A Complete traumatic amputation at level between knee and ankle, left lower leg, initial encounter: Secondary | ICD-10-CM | POA: Diagnosis not present

## 2018-11-17 DIAGNOSIS — D500 Iron deficiency anemia secondary to blood loss (chronic): Secondary | ICD-10-CM | POA: Diagnosis present

## 2018-11-17 DIAGNOSIS — N2581 Secondary hyperparathyroidism of renal origin: Secondary | ICD-10-CM | POA: Diagnosis present

## 2018-11-17 DIAGNOSIS — E8889 Other specified metabolic disorders: Secondary | ICD-10-CM | POA: Diagnosis present

## 2018-11-17 DIAGNOSIS — E1151 Type 2 diabetes mellitus with diabetic peripheral angiopathy without gangrene: Secondary | ICD-10-CM | POA: Diagnosis present

## 2018-11-17 DIAGNOSIS — Z9049 Acquired absence of other specified parts of digestive tract: Secondary | ICD-10-CM | POA: Diagnosis not present

## 2018-11-17 DIAGNOSIS — I428 Other cardiomyopathies: Secondary | ICD-10-CM | POA: Diagnosis present

## 2018-11-17 DIAGNOSIS — R0602 Shortness of breath: Secondary | ICD-10-CM | POA: Diagnosis not present

## 2018-11-17 DIAGNOSIS — M869 Osteomyelitis, unspecified: Secondary | ICD-10-CM

## 2018-11-17 DIAGNOSIS — Z87891 Personal history of nicotine dependence: Secondary | ICD-10-CM | POA: Diagnosis not present

## 2018-11-17 DIAGNOSIS — K5903 Drug induced constipation: Secondary | ICD-10-CM | POA: Diagnosis not present

## 2018-11-17 DIAGNOSIS — M86472 Chronic osteomyelitis with draining sinus, left ankle and foot: Secondary | ICD-10-CM | POA: Diagnosis not present

## 2018-11-17 DIAGNOSIS — A4102 Sepsis due to Methicillin resistant Staphylococcus aureus: Secondary | ICD-10-CM | POA: Diagnosis present

## 2018-11-17 DIAGNOSIS — D72829 Elevated white blood cell count, unspecified: Secondary | ICD-10-CM | POA: Diagnosis not present

## 2018-11-17 DIAGNOSIS — E46 Unspecified protein-calorie malnutrition: Secondary | ICD-10-CM | POA: Diagnosis present

## 2018-11-17 DIAGNOSIS — N186 End stage renal disease: Secondary | ICD-10-CM | POA: Diagnosis present

## 2018-11-17 DIAGNOSIS — L97529 Non-pressure chronic ulcer of other part of left foot with unspecified severity: Secondary | ICD-10-CM

## 2018-11-17 DIAGNOSIS — I739 Peripheral vascular disease, unspecified: Secondary | ICD-10-CM | POA: Diagnosis not present

## 2018-11-17 DIAGNOSIS — Z89512 Acquired absence of left leg below knee: Secondary | ICD-10-CM | POA: Diagnosis not present

## 2018-11-17 DIAGNOSIS — E1142 Type 2 diabetes mellitus with diabetic polyneuropathy: Secondary | ICD-10-CM | POA: Diagnosis present

## 2018-11-17 DIAGNOSIS — L97419 Non-pressure chronic ulcer of right heel and midfoot with unspecified severity: Secondary | ICD-10-CM | POA: Diagnosis present

## 2018-11-17 DIAGNOSIS — R52 Pain, unspecified: Secondary | ICD-10-CM | POA: Diagnosis not present

## 2018-11-17 DIAGNOSIS — Z720 Tobacco use: Secondary | ICD-10-CM | POA: Diagnosis not present

## 2018-11-17 DIAGNOSIS — M86172 Other acute osteomyelitis, left ankle and foot: Secondary | ICD-10-CM

## 2018-11-17 DIAGNOSIS — G8918 Other acute postprocedural pain: Secondary | ICD-10-CM

## 2018-11-17 DIAGNOSIS — L03116 Cellulitis of left lower limb: Secondary | ICD-10-CM | POA: Diagnosis present

## 2018-11-17 DIAGNOSIS — R531 Weakness: Secondary | ICD-10-CM | POA: Diagnosis not present

## 2018-11-17 DIAGNOSIS — A4902 Methicillin resistant Staphylococcus aureus infection, unspecified site: Secondary | ICD-10-CM | POA: Diagnosis not present

## 2018-11-17 DIAGNOSIS — S91301A Unspecified open wound, right foot, initial encounter: Secondary | ICD-10-CM | POA: Diagnosis not present

## 2018-11-17 DIAGNOSIS — Z89429 Acquired absence of other toe(s), unspecified side: Secondary | ICD-10-CM | POA: Diagnosis not present

## 2018-11-17 DIAGNOSIS — Z791 Long term (current) use of non-steroidal anti-inflammatories (NSAID): Secondary | ICD-10-CM | POA: Diagnosis not present

## 2018-11-17 DIAGNOSIS — D638 Anemia in other chronic diseases classified elsewhere: Secondary | ICD-10-CM | POA: Diagnosis not present

## 2018-11-17 DIAGNOSIS — K029 Dental caries, unspecified: Secondary | ICD-10-CM | POA: Diagnosis not present

## 2018-11-17 DIAGNOSIS — L84 Corns and callosities: Secondary | ICD-10-CM | POA: Diagnosis not present

## 2018-11-17 DIAGNOSIS — B958 Unspecified staphylococcus as the cause of diseases classified elsewhere: Secondary | ICD-10-CM | POA: Diagnosis not present

## 2018-11-17 DIAGNOSIS — M62838 Other muscle spasm: Secondary | ICD-10-CM | POA: Diagnosis not present

## 2018-11-17 DIAGNOSIS — L97523 Non-pressure chronic ulcer of other part of left foot with necrosis of muscle: Secondary | ICD-10-CM | POA: Diagnosis not present

## 2018-11-17 DIAGNOSIS — Z833 Family history of diabetes mellitus: Secondary | ICD-10-CM | POA: Diagnosis not present

## 2018-11-17 DIAGNOSIS — Z880 Allergy status to penicillin: Secondary | ICD-10-CM | POA: Diagnosis not present

## 2018-11-17 DIAGNOSIS — E871 Hypo-osmolality and hyponatremia: Secondary | ICD-10-CM | POA: Diagnosis not present

## 2018-11-17 DIAGNOSIS — Z23 Encounter for immunization: Secondary | ICD-10-CM | POA: Diagnosis present

## 2018-11-17 DIAGNOSIS — I959 Hypotension, unspecified: Secondary | ICD-10-CM | POA: Diagnosis not present

## 2018-11-17 DIAGNOSIS — Z8249 Family history of ischemic heart disease and other diseases of the circulatory system: Secondary | ICD-10-CM | POA: Diagnosis not present

## 2018-11-17 DIAGNOSIS — B9561 Methicillin susceptible Staphylococcus aureus infection as the cause of diseases classified elsewhere: Secondary | ICD-10-CM | POA: Diagnosis not present

## 2018-11-17 LAB — COMPREHENSIVE METABOLIC PANEL
ALT: 37 U/L (ref 0–44)
AST: 49 U/L — ABNORMAL HIGH (ref 15–41)
Albumin: 2.8 g/dL — ABNORMAL LOW (ref 3.5–5.0)
Alkaline Phosphatase: 54 U/L (ref 38–126)
Anion gap: 17 — ABNORMAL HIGH (ref 5–15)
BILIRUBIN TOTAL: 0.9 mg/dL (ref 0.3–1.2)
BUN: 23 mg/dL (ref 8–23)
CHLORIDE: 92 mmol/L — AB (ref 98–111)
CO2: 27 mmol/L (ref 22–32)
CREATININE: 8.28 mg/dL — AB (ref 0.61–1.24)
Calcium: 8.5 mg/dL — ABNORMAL LOW (ref 8.9–10.3)
GFR, EST AFRICAN AMERICAN: 7 mL/min — AB (ref >60)
GFR, EST NON AFRICAN AMERICAN: 6 mL/min — AB (ref >60)
Glucose, Bld: 139 mg/dL — ABNORMAL HIGH (ref 70–99)
POTASSIUM: 3.5 mmol/L (ref 3.5–5.1)
Sodium: 136 mmol/L (ref 135–145)
TOTAL PROTEIN: 8.4 g/dL — AB (ref 6.5–8.1)

## 2018-11-17 LAB — CBC WITH DIFFERENTIAL/PLATELET
Abs Immature Granulocytes: 0.05 10*3/uL (ref 0.00–0.07)
BASOS ABS: 0 10*3/uL (ref 0.0–0.1)
Basophils Relative: 0 %
EOS PCT: 3 %
Eosinophils Absolute: 0.3 10*3/uL (ref 0.0–0.5)
HCT: 36.3 % — ABNORMAL LOW (ref 39.0–52.0)
HEMOGLOBIN: 11.3 g/dL — AB (ref 13.0–17.0)
Immature Granulocytes: 1 %
LYMPHS PCT: 16 %
Lymphs Abs: 1.5 10*3/uL (ref 0.7–4.0)
MCH: 28.3 pg (ref 26.0–34.0)
MCHC: 31.1 g/dL (ref 30.0–36.0)
MCV: 90.8 fL (ref 80.0–100.0)
MONO ABS: 1.2 10*3/uL — AB (ref 0.1–1.0)
Monocytes Relative: 12 %
NEUTROS ABS: 6.4 10*3/uL (ref 1.7–7.7)
NRBC: 0 % (ref 0.0–0.2)
Neutrophils Relative %: 68 %
Platelets: 278 10*3/uL (ref 150–400)
RBC: 4 MIL/uL — AB (ref 4.22–5.81)
RDW: 16.2 % — ABNORMAL HIGH (ref 11.5–15.5)
WBC: 9.4 10*3/uL (ref 4.0–10.5)

## 2018-11-17 LAB — I-STAT CG4 LACTIC ACID, ED
LACTIC ACID, VENOUS: 2.01 mmol/L — AB (ref 0.5–1.9)
Lactic Acid, Venous: 1.35 mmol/L (ref 0.5–1.9)

## 2018-11-17 LAB — GLUCOSE, CAPILLARY
GLUCOSE-CAPILLARY: 211 mg/dL — AB (ref 70–99)
Glucose-Capillary: 218 mg/dL — ABNORMAL HIGH (ref 70–99)

## 2018-11-17 LAB — VANCOMYCIN, RANDOM: Vancomycin Rm: 19

## 2018-11-17 MED ORDER — INSULIN ASPART PROT & ASPART (70-30 MIX) 100 UNIT/ML ~~LOC~~ SUSP
5.0000 [IU] | Freq: Two times a day (BID) | SUBCUTANEOUS | Status: DC
  Administered 2018-11-18 – 2018-11-22 (×7): 5 [IU] via SUBCUTANEOUS
  Filled 2018-11-17: qty 10

## 2018-11-17 MED ORDER — INSULIN ASPART PROT & ASPART (70-30 MIX) 100 UNIT/ML ~~LOC~~ SUSP: 5.0000 [IU] | Freq: Three times a day (TID) | SUBCUTANEOUS | Status: DC

## 2018-11-17 MED ORDER — PRIMIDONE 50 MG PO TABS
50.0000 mg | ORAL_TABLET | Freq: Every day | ORAL | Status: DC
  Administered 2018-11-17 – 2018-11-21 (×5): 50 mg via ORAL
  Filled 2018-11-17 (×5): qty 1

## 2018-11-17 MED ORDER — CLOPIDOGREL BISULFATE 75 MG PO TABS
75.0000 mg | ORAL_TABLET | Freq: Every day | ORAL | Status: DC
  Administered 2018-11-18: 75 mg via ORAL
  Filled 2018-11-17: qty 1

## 2018-11-17 MED ORDER — SODIUM CHLORIDE 0.9 % IV SOLN
2.0000 g | Freq: Once | INTRAVENOUS | Status: AC
  Administered 2018-11-17: 2 g via INTRAVENOUS
  Filled 2018-11-17: qty 2

## 2018-11-17 MED ORDER — MIDODRINE HCL 5 MG PO TABS
10.0000 mg | ORAL_TABLET | Freq: Two times a day (BID) | ORAL | Status: DC
  Administered 2018-11-18 – 2018-11-19 (×3): 10 mg via ORAL
  Filled 2018-11-17 (×2): qty 2

## 2018-11-17 MED ORDER — ADULT MULTIVITAMIN W/MINERALS CH
1.0000 | ORAL_TABLET | Freq: Every day | ORAL | Status: DC
  Administered 2018-11-18 – 2018-11-20 (×3): 1 via ORAL
  Filled 2018-11-17 (×3): qty 1

## 2018-11-17 MED ORDER — ACETAMINOPHEN 325 MG PO TABS
650.0000 mg | ORAL_TABLET | Freq: Once | ORAL | Status: AC
  Administered 2018-11-17: 650 mg via ORAL
  Filled 2018-11-17: qty 2

## 2018-11-17 MED ORDER — FERRIC CITRATE 1 GM 210 MG(FE) PO TABS
630.0000 mg | ORAL_TABLET | Freq: Three times a day (TID) | ORAL | Status: DC
  Administered 2018-11-18 – 2018-11-19 (×4): 630 mg via ORAL
  Filled 2018-11-17 (×8): qty 3

## 2018-11-17 MED ORDER — ACETAMINOPHEN 325 MG PO TABS
650.0000 mg | ORAL_TABLET | Freq: Four times a day (QID) | ORAL | Status: DC | PRN
  Administered 2018-11-18 – 2018-11-21 (×3): 650 mg via ORAL
  Filled 2018-11-17 (×2): qty 2

## 2018-11-17 MED ORDER — SODIUM CHLORIDE 0.9 % IV SOLN: 2.0000 g | Freq: Three times a day (TID) | INTRAVENOUS | Status: DC

## 2018-11-17 MED ORDER — SODIUM CHLORIDE 0.9 % IV SOLN
1.0000 g | INTRAVENOUS | Status: DC
  Administered 2018-11-18: 1 g via INTRAVENOUS
  Filled 2018-11-17 (×2): qty 1

## 2018-11-17 MED ORDER — MUPIROCIN 2 % EX OINT: 1.0000 "application " | TOPICAL_OINTMENT | Freq: Every day | CUTANEOUS | Status: DC | PRN

## 2018-11-17 MED ORDER — HEPARIN SODIUM (PORCINE) 5000 UNIT/ML IJ SOLN
5000.0000 [IU] | Freq: Three times a day (TID) | INTRAMUSCULAR | Status: DC
  Administered 2018-11-17 – 2018-11-20 (×6): 5000 [IU] via SUBCUTANEOUS
  Filled 2018-11-17 (×4): qty 1

## 2018-11-17 MED ORDER — SODIUM CHLORIDE 0.9 % IV BOLUS: 500.0000 mL | Freq: Once | INTRAVENOUS | Status: DC

## 2018-11-17 MED ORDER — INSULIN ASPART 100 UNIT/ML ~~LOC~~ SOLN
0.0000 [IU] | Freq: Three times a day (TID) | SUBCUTANEOUS | Status: DC
  Administered 2018-11-18: 1 [IU] via SUBCUTANEOUS
  Administered 2018-11-18 – 2018-11-20 (×3): 2 [IU] via SUBCUTANEOUS
  Administered 2018-11-20: 1 [IU] via SUBCUTANEOUS
  Administered 2018-11-21: 2 [IU] via SUBCUTANEOUS
  Administered 2018-11-21: 3 [IU] via SUBCUTANEOUS
  Administered 2018-11-22: 2 [IU] via SUBCUTANEOUS

## 2018-11-17 MED ORDER — TIMOLOL MALEATE 0.5 % OP SOLN
1.0000 [drp] | Freq: Two times a day (BID) | OPHTHALMIC | Status: DC
  Administered 2018-11-17 – 2018-11-22 (×10): 1 [drp] via OPHTHALMIC
  Filled 2018-11-17: qty 5

## 2018-11-17 MED ORDER — ATORVASTATIN CALCIUM 40 MG PO TABS
40.0000 mg | ORAL_TABLET | Freq: Every day | ORAL | Status: DC
  Administered 2018-11-17 – 2018-11-21 (×5): 40 mg via ORAL
  Filled 2018-11-17 (×5): qty 1

## 2018-11-17 MED ORDER — VANCOMYCIN VARIABLE DOSE PER UNSTABLE RENAL FUNCTION (PHARMACIST DOSING): Status: DC

## 2018-11-17 MED ORDER — SODIUM CHLORIDE 0.9 % IV BOLUS
1000.0000 mL | Freq: Once | INTRAVENOUS | Status: AC
  Administered 2018-11-17: 1000 mL via INTRAVENOUS

## 2018-11-17 NOTE — ED Notes (Signed)
Pt states he does not make very much urine, urinal given to pt and will let staff know when he is able to provide a sample.

## 2018-11-17 NOTE — H&P (Addendum)
History and Physical  [name] LAFORTE Sr. UGQ:[id number] DOB: 30-Nov-1951 DOA: 11/17/2018  Referring physician: Luan Moore. [place] PCP: [name], MD  Outpatient Specialists: Renal Patient coming from: Home & is able to ambulate yes  Chief Complaint:  Left foot pain with ulcer and fever  HPI: [name]. is a 67 y.o. male with medical history significant for 67 year old male with past medical history of diabetes end-stage renal disease on hemodialysis Monday Wednesday on Friday type 2 diabetes mellitus hypertension cardiomyopathy severe peripheral vascular disease multiple toe amputations bilaterally.  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  Today while getting his hemodialysis he was noted to have fever and was referred to the emergency department for further evaluation.  He was started on vancomycin which he usually gets with dialysis.  He denies any chest pain shortness of breath nausea vomiting cough sputum production none diarrhea vomiting or abdominal pain.  ED Course: Sepsis was initiated he was started on vancomycin and Maxipime IV fluid and Tylenol given  Review of Systems: Positive chills and fever, generalized weakness left foot pain.  Left foot leg swelling, ulcer both feet    Pt denies any nausea or vomiting.  Review of systems are otherwise negative   Past Medical History:  Diagnosis Date  . Cardiomyopathy ([place])   . Cataract    right eye  . Diabetes mellitus    type 2  . Diabetic foot ulcer ([place])   . Diabetic infection of right foot ([place])   . Diabetic  peripheral neuropathy ([place])   . ESRD (end stage renal disease) ([place])    m-w-f [facility] [place]  . Glaucoma    left eye  . Headache   . HTN (hypertension)   . Osteomyelitis ([place])   . Peripheral vascular disease ([place])    critical limb ischemia   Past Surgical History:  Procedure Laterality Date  . ABDOMINAL AORTOGRAM N/A 05/04/2018   Procedure: ABDOMINAL AORTOGRAM;  Surgeon: Conrad Kingsbury, MD;  Location: [facility];  Service: Cardiovascular;  Laterality: N/A;  . AMPUTATION TOE Right 01/21/2017   Procedure: AMPUTATION 2ND TOE RIGHT FOOT ;  Surgeon: Dorna Leitz, MD;  Location: [place];  Service: Orthopedics;  Laterality: Right;  . AMPUTATION TOE Left 05/18/2017   Procedure: 5TH RAY AMPUTATION  and EXCISION CUBOID;  Surgeon: Dorna Leitz, MD;  Location: [facility];  Service: Orthopedics;  Laterality: Left;  . AMPUTATION TOE Bilateral 01/27/2018   Procedure: RIGHT FOOT 3RD TOE AMPUTATION WITH INCISIONAL WOUND DEBRIDEMENT. LEFT FOOT 2ND TOE AMPUTATION AND INCISIONAL WOUND DEBRIDEMENT.;  Surgeon: Dorna Leitz, MD;  Location: [facility];  Service: Orthopedics;  Laterality: Bilateral;  . AV FISTULA PLACEMENT Left 11/27/2014   Procedure: ARTERIOVENOUS (AV) FISTULA CREATION;  Surgeon: Conrad McDonald, MD;  Location: [facility];  Service: Vascular;  Laterality: Left;  . CHOLECYSTECTOMY    . COLONOSCOPY N/A 11/28/2014   Procedure: COLONOSCOPY;  Surgeon: Ladene Artist, MD;  Location: [facility] ENDOSCOPY;  Service: Endoscopy;  Laterality: N/A;  . ESOPHAGOGASTRODUODENOSCOPY N/A 11/28/2014   Procedure: ESOPHAGOGASTRODUODENOSCOPY (EGD);  Surgeon: Ladene Artist, MD;  Location: [facility] ENDOSCOPY;  Service: Endoscopy;  Laterality: N/A;  . EYE SURGERY     retina and cataract left eye surgery  . I&D EXTREMITY  01/28/2012   Procedure: IRRIGATION AND DEBRIDEMENT EXTREMITY;  Surgeon: Paulene Floor, MD;  [facility]: [facility];  Service: Orthopedics;  Laterality: Left;  irrigation and drainage left hand  . I&D EXTREMITY   01/30/2012   Procedure: IRRIGATION AND DEBRIDEMENT EXTREMITY;  Surgeon: Paulene Floor, MD;  Location: [facility];  Service: Orthopedics;  Laterality: Left;  flexor teno synovectomy and extensor tenosynovectomy arthrosynovectomy wristjoint  . I&D EXTREMITY  02/01/2012   Procedure: IRRIGATION AND DEBRIDEMENT EXTREMITY;  Surgeon: Paulene Floor, MD;  [facility]: [facility];  Service: Orthopedics;  Laterality: Left;  Left Wrist, Hand and Forearm  . IRRIGATION AND DEBRIDEMENT FOOT Left 01/21/2017   Procedure: DEBRIDEMENT LEFT PLANTAR WOUND;  Surgeon: Dorna Leitz, MD;  Location: [place];  Service: Orthopedics;  Laterality: Left;  . Left 4th toe amputation    . LEFT AND RIGHT HEART CATHETERIZATION WITH CORONARY ANGIOGRAM N/A 11/26/2014   Procedure: LEFT AND RIGHT HEART CATHETERIZATION WITH CORONARY ANGIOGRAM;  Surgeon: Leonie Man, MD;  Location: [facility] LAB;  Service: Cardiovascular;  Laterality: N/A;  . LOWER EXTREMITY ANGIOGRAPHY Bilateral 05/04/2018   Procedure: Lower Extremity Angiography;  Surgeon: Conrad [place], MD;  Location: [place] [facility];  Service: Cardiovascular;  Laterality: Bilateral;  . LOWER EXTREMITY ANGIOGRAPHY Left 05/11/2018   Procedure: LOWER EXTREMITY ANGIOGRAPHY;  Surgeon: Conrad [place], MD;  Location: [facility];  Service: Cardiovascular;  Laterality: Left;  . PERIPHERAL VASCULAR BALLOON ANGIOPLASTY Left 05/11/2018   Procedure: PERIPHERAL VASCULAR BALLOON ANGIOPLASTY;  Surgeon: Conrad [place], MD;  Location: [place] [facility];  Service: Cardiovascular;  Laterality: Left;  Posterior tibial  . PERIPHERAL VASCULAR INTERVENTION Left 05/11/2018   Procedure: PERIPHERAL VASCULAR INTERVENTION;  Surgeon: Conrad Erskine, MD;  Location: [place] [facility];  Service: Cardiovascular;  Laterality: Left;  tibioperoneal trunk  . TONSILLECTOMY      Social History:  reports that he has quit smoking. His smoking use included cigarettes. He has a 2.00 pack-year smoking history. He has never  used smokeless tobacco. He reports that he does not drink alcohol or use drugs.   Allergies  Allergen Reactions  . Penicillins Hives, Swelling and Other (See Comments)    Ankle swelling Has patient had a PCN reaction causing immediate rash, facial/tongue/throat swelling, SOB or lightheadedness with hypotension: Yes Has patient had a PCN reaction causing severe rash involving mucus membranes or skin necrosis: No Has patient had a PCN reaction that required hospitalization: No Has patient had a PCN reaction occurring within the last 10 years: No If all of the above answers are "NO", then may proceed with Cephalosporin use.    Family History  Problem Relation Age of Onset  . Heart disease Mother   . Diabetes Father   . Diabetes Brother   . Diabetes Sister   . Diabetes Brother   . Diabetes Brother       Prior to Admission medications   Medication Sig Start Date End Date Taking? Authorizing Provider  acetaminophen (TYLENOL) 325 MG tablet Take 650 mg by mouth every 6 (six) hours as needed (pain).    Yes [provider]  atorvastatin (LIPITOR) 40 MG tablet Take 40 mg by mouth at bedtime.    Yes [provider]  ciprofloxacin (CIPRO) 500 MG  tablet Take 1 tablet (500 mg total) by mouth daily. 10/17/18  Yes Campbell Riches, MD  clopidogrel (PLAVIX) 75 MG tablet Take 1 tablet (75 mg total) by mouth daily. Patient taking differently: Take 75 mg by mouth at bedtime.  05/16/18  Yes Conrad Cuming, MD  ferric citrate (AURYXIA) 1 GM 210 MG(Fe) tablet Take 630 mg by mouth 3 (three) times daily with meals.    Yes [provider]  ibuprofen (ADVIL,MOTRIN) 200 MG tablet Take 400 mg by mouth daily as needed for headache (pain).    Yes [provider]  insulin NPH-regular Human (NOVOLIN 70/30) (70-30) 100 UNIT/ML injection Inject 5-6 Units into the skin 2 (two) times daily with a meal. Sliding Scale - based on CBG   Yes [provider]  midodrine  (PROAMATINE) 5 MG tablet Take 10 mg by mouth 2 (two) times daily with a meal. 10/28/18  Yes [provider]  Multiple Vitamins-Minerals (MULTIVITAMIN WITH MINERALS) tablet Take 1 tablet by mouth daily with supper.    Yes [provider]  mupirocin ointment (BACTROBAN) 2 % Apply 1 application topically daily as needed (rassh).  08/05/16  Yes [provider]  primidone (MYSOLINE) 50 MG tablet Take 0.5 tablets (25 mg total) by mouth at bedtime. Patient taking differently: Take 50 mg by mouth at bedtime.  05/25/17  Yes Patrecia Pour, MD  timolol (TIMOPTIC) 0.5 % ophthalmic solution Place 1 drop into the left eye 2 (two) times daily.   Yes [provider]  glucose monitoring kit (FREESTYLE) monitoring kit 1 each by Does not apply route 4 (four) times daily - after meals and at bedtime. 1 month Diabetic Testing Supplies for QAC-QHS accuchecks.Any brand OK 11/29/14   Thurnell Lose, MD  HYDROcodone-acetaminophen (NORCO/VICODIN) 5-325 MG tablet Take 1 tablet by mouth every 6 (six) hours as needed for moderate pain. Patient not taking: Reported on 11/17/2018 01/27/18   Gary Fleet, PA-C  Insulin Syringe-Needle U-100 25G X 1" 1 ML MISC Any brand, for 4 times a day insulin SQ, 1 month supply. 11/29/14   Thurnell Lose, MD  midodrine (PROAMATINE) 10 MG tablet Take 1 tablet (10 mg total) by mouth 3 (three) times daily with meals. Patient not taking: Reported on 11/17/2018 10/31/18   Erlene Quan, PA-C    Physical Exam: BP 134/64   Pulse 81   Temp (!) 100.6 F (38.1 C) (Rectal)   Resp (!) 24   Ht 6' 1" (1.854 m)   Wt 97.5 kg   SpO2 94%   BMI 28.37 kg/m   Exam:  . General: 67 y.o. year-old male well developed well nourished in no acute distress.  Alert and oriented x3. . Cardiovascular: Regular rate and rhythm with no rubs or gallops.  No thyromegaly or JVD noted.   Marland Kitchen Respiratory: Clear to auscultation with no wheezes or rales. Good inspiratory  effort. . Abdomen: Soft nontender nondistended with normal bowel sounds x4 quadrants. . Musculoskeletal: Edema of the left foot.  He moves all 4 extremities.  Pulses hardly palpable in both lower extremities.  He has multiple missing toes on the left foot he has amputated second fourth and fifth toes.  On the right he has amputation of second to the fifth toe. . Skin: Bilateral foot ulcers chronic with redness and swelling particularly of the left foot the ulcer on the right foot is located at the second toe foot pad and also on the left is at the lateral  border of the left foot there is 2 ulcers 1 measuring about 3 cm . Psychiatry: Mood is appropriate for condition and setting           Labs on Admission:  Basic Metabolic Panel: Recent Labs  Lab 11/17/18 1346  NA 136  K 3.5  CL 92*  CO2 27  GLUCOSE 139*  BUN 23  CREATININE 8.28*  CALCIUM 8.5*   Liver Function Tests: Recent Labs  Lab 11/17/18 1346  AST 49*  ALT 37  ALKPHOS 54  BILITOT 0.9  PROT 8.4*  ALBUMIN 2.8*   No results for input(s): LIPASE, AMYLASE in the last 168 hours. No results for input(s): AMMONIA in the last 168 hours. CBC: Recent Labs  Lab 11/17/18 1346  WBC 9.4  NEUTROABS 6.4  HGB 11.3*  HCT 36.3*  MCV 90.8  PLT 278   Cardiac Enzymes: No results for input(s): CKTOTAL, CKMB, CKMBINDEX, TROPONINI in the last 168 hours.  BNP (last 3 results) No results for input(s): BNP in the last 8760 hours.  ProBNP (last 3 results) No results for input(s): PROBNP in the last 8760 hours.  CBG: No results for input(s): GLUCAP in the last 168 hours.  Radiological Exams on Admission: Dg Chest Port 1 View  Result Date: 11/17/2018 CLINICAL DATA:  67 year old male with shortness of breath, weakness and sepsis EXAM: PORTABLE CHEST 1 VIEW COMPARISON:  Prior chest x-ray 05/10/2018 FINDINGS: The lungs are clear and negative for focal airspace consolidation, pulmonary edema or suspicious pulmonary nodule. No pleural  effusion or pneumothorax. Cardiac and mediastinal contours are within normal limits. Atherosclerotic plaque present in the transverse aorta. No acute fracture or lytic or blastic osseous lesions. The visualized upper abdominal bowel gas pattern is unremarkable. IMPRESSION: No acute cardiopulmonary process. Aortic Atherosclerosis (ICD10-170.0) Electronically Signed   By: Jacqulynn Cadet M.D.   On: 11/17/2018 14:08    EKG: Independently reviewed.  Assessment/Plan Present on Admission: . Type 2 diabetes mellitus with left diabetic foot ulcer ([place]) . Diabetic ulcer of foot associated with diabetes mellitus due to underlying condition, with necrosis of muscle ([place]) . (Resolved) Osteomyelitis of toe of right foot ([place]) . HTN (hypertension) . Osteomyelitis of left foot ([place]) . Anemia due to chronic blood loss . Sepsis ([place])  Principal Problem:   Sepsis ([place]) Active Problems:   HTN (hypertension)   Anemia due to chronic blood loss   Osteomyelitis of left foot ([facility])   Type 2 diabetes mellitus with left diabetic foot ulcer ([place])   ESRD on hemodialysis ([place])   Diabetic ulcer of foot associated with diabetes mellitus due to underlying condition, with necrosis of muscle ([place])  1.  Sepsis due to unspecified organism we will continue vancomycin and next Maxipen IV 2.  Cellulitis of left lower extremity continue Maxipen and vancomycin 3.  Osteomyelitis of the left foot 4.  Leukocytosis we will monitor 5.  End-stage renal disease on hemodialysis Monday Wednesday and Friday 6.  Protein malnutrition with albumin of 2.8.  Regular protein diet increase protein diet 7.  Type 2 diabetes mellitus which is stable improved control continue per home medication and sliding scale coverage  Severity of Illness: The appropriate patient status for this patient is INPATIENT. Inpatient status is judged to be reasonable and necessary in order to provide the required intensity of service to ensure the patient's  safety. The patient's presenting symptoms, physical exam findings, and initial radiographic and laboratory data in the context of their chronic comorbidities is felt to place  them at high risk for further clinical deterioration. Furthermore, it is not anticipated that the patient will be medically stable for discharge from the hospital within 2 midnights of admission. The following factors support the patient status of inpatient.   " The patient's presenting symptoms include fever tachycardia critical limb ischemia. " The worrisome physical exam findings include bilateral foot ulcer osteomyelitis. " The initial radiographic and laboratory data are worrisome because of elevated lactic acid and leukocytosis. " The chronic co-morbidities include diabetes.   * I certify that at the point of admission it is my clinical judgment that the patient will require inpatient hospital care spanning beyond 2 midnights from the point of admission due to high intensity of service, high risk for further deterioration and high frequency of surveillance required.*    DVT prophylaxis: Subacute heparin  Code Status: Full  Family Communication: None at bedside  Disposition Plan: Home when stable  Consults called: None  Admission status: Inpatient    [name] MD [facility] Pager [phone]  If 7PM-7AM, please contact night-coverage [email or web address] Password [facility]  11/17/2018, 6:12 PM

## 2018-11-17 NOTE — ED Triage Notes (Signed)
Pt from dialysis, only got 30 minutes of dialysis, ghx of mrsa to left foot, staph to right foot and has been taking abx at home since Tuesday for it, running fever per dialyis

## 2018-11-17 NOTE — Progress Notes (Addendum)
Pharmacy Antibiotic Note  [name]. is a 67 y.o. male admitted on 11/17/2018 with sepsis secondary to cellulitis.  Pharmacy has been consulted for cefepime and vancomycin dosing. Patient noted to having penicillin allergy, but has tolerated cephalosporins in the past.  Currently, febrile (Tmax 100.6) and WBC wnl. Of note, patient has history of ESRD receiving dialysis qMWF.   Patient was started on antibiotics Tuesday this week for foot infection. Today, patient received ~30 minutes of dialysis with vancomycin 1 gm infusion PTA. Upon arrival to ED, vancomycin infusion was already paused with approximately half the bag remaining. Vancomycin random level this evening was 19 mcg/mL.   Will hold off scheduling maintenance vancomycin dosing until dialysis plans have been determined.    Plan: Cefepime 2 gm IV once, then 1 gm IV q24h F/u HD plans for vancomycin scheduling F/u cultures and LOT   Height: 6\' 1"  (185.4 cm) Weight: 215 lb (97.5 kg) IBW/kg (Calculated) : 79.9  Temp (24hrs), Avg:100.6 F (38.1 C), Min:100.6 F (38.1 C), Max:100.6 F (38.1 C)  Recent Labs  Lab 11/17/18 1346 11/17/18 1406  WBC 9.4  --   LATICACIDVEN  --  2.01*    CrCl cannot be calculated (Patient's most recent lab result is older than the maximum 21 days allowed.).    Allergies  Allergen Reactions  . Penicillins Hives, Swelling and Other (See Comments)    Ankle swelling  PATIENT HAD A PCN REACTION WITH IMMEDIATE RASH, FACIAL/TONGUE/THROAT SWELLING, SOB, OR LIGHTHEADEDNESS WITH HYPOTENSION:  #  #  #  YES  #  #  #   Has patient had a PCN reaction causing severe rash involving mucus membranes or skin necrosis: no Has patient had a PCN reaction that required hospitalization: no Has patient had a PCN reaction occurring within the last 10 years: no If all of the above answers are "NO", then may proceed with Cephalosporin use.    Antimicrobials this admission: Cefepime 11/22>>  Microbiology  results: 11/22 BCx:  Thank you for allowing pharmacy to be a part of this patient's care.  Willia Craze, Pharmacy Student

## 2018-11-17 NOTE — ED Provider Notes (Signed)
[facility] EMERGENCY DEPARTMENT Provider Note   CSN: [id number] Arrival date & time: 11/17/18  1329     History   Chief Complaint No chief complaint on file.   HPI [name] KRISTIANSEN Sr. is a 67 y.o. male.  HPI 67 year old male, diabetic, ESRD on dialysis MWF, presents with a fever.  He also notes generalized weakness for the last 2 days.  He states he has been followed by wound care for chronic wounds to bilateral feet.  He has been previously diagnosed with osteomyelitis of the left foot.  He recently had a wound culture which showed staph and he was started on vancomycin on Tuesday.  He states however he is having redness and edema of the left foot and ankle yesterday.  Today while getting dialysis they noted that he had a fever and symptoms to the ED for further evaluation.  Patient is currently receiving a dose of vancomycin.  He denies any chest pain, shortness breath, nausea, vomiting, cough, sputum production.  Past Medical History:  Diagnosis Date  . Cardiomyopathy ([place])   . Cataract    right eye  . Diabetes mellitus    type 2  . Diabetic foot ulcer ([place])   . Diabetic infection of right foot ([place])   . Diabetic peripheral neuropathy ([place])   . ESRD (end stage renal disease) ([place])    m-w-f [facility] [address]  . Glaucoma    left eye  . Headache   . HTN (hypertension)   . Osteomyelitis ([place])   . Peripheral vascular disease ([place])    critical limb ischemia    Patient Active Problem List   Diagnosis Date Noted  . Normal coronary arteries 10/31/2018  . Syncope, non cardiac 10/31/2018  . Sepsis ([place]) 05/10/2018  . HLD (hyperlipidemia) 05/10/2018  . Diabetic ulcer of foot associated with diabetes mellitus due to underlying condition, with necrosis of muscle ([place]) 01/27/2018  . Onychomycosis of multiple toenails with type 1 diabetes mellitus ([place]) 01/27/2018  . Onychomycosis of multiple toenails with type 2 diabetes mellitus and  peripheral angiopathy ([place]) 10/05/2017  . ESRD on hemodialysis ([place])   . Osteomyelitis of right foot ([place]) 02/03/2017  . Type 2 diabetes mellitus with left diabetic foot ulcer ([place]) 01/21/2017  . Osteomyelitis of left foot ([place]) 09/11/2016  . Peripheral arterial disease ([place]) 08/12/2015  . Paroxysmal supraventricular tachycardia ([place]) 01/30/2015  . Occult blood in stools 11/25/2014  . Acute systolic heart failure ([place]) 11/25/2014  . Congestive dilated cardiomyopathy ([place]) 11/24/2014  . Visual changes 11/23/2014  . Cardiomegaly 11/23/2014  . Benign essential HTN 11/23/2014  . 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 Anemia due to chronic blood loss 11/23/2014  . Anemia due to end stage renal disease ([place]) 11/22/2014  . Symptomatic anemia   . Cellulitis and abscess of hand 01/28/2012  . HTN (hypertension) 01/28/2012  . Hyponatremia 01/28/2012  . Normocytic anemia 01/28/2012  . Sinusitis 01/28/2012    Past Surgical History:  Procedure Laterality Date  . ABDOMINAL AORTOGRAM N/A 05/04/2018   Procedure: ABDOMINAL AORTOGRAM;  Surgeon: Conrad [place], MD;  Location: [facility];  Service: Cardiovascular;  Laterality: N/A;  . AMPUTATION TOE Right 01/21/2017   Procedure: AMPUTATION 2ND TOE RIGHT FOOT ;  Surgeon: Dorna Leitz, MD;  Location: [facility];  Service: Orthopedics;  Laterality: Right;  . AMPUTATION TOE Left 05/18/2017   Procedure: 5TH RAY AMPUTATION  and EXCISION CUBOID;  Surgeon: Dorna Leitz, MD;  Location: [facility];  Service: Orthopedics;  Laterality: Left;  . AMPUTATION TOE Bilateral 01/27/2018   Procedure: RIGHT FOOT 3RD TOE AMPUTATION WITH INCISIONAL WOUND DEBRIDEMENT. LEFT FOOT 2ND TOE AMPUTATION AND INCISIONAL WOUND DEBRIDEMENT.;  Surgeon: Dorna Leitz, MD;  Location: [facility];  Service: Orthopedics;  Laterality: Bilateral;  . AV FISTULA PLACEMENT Left 11/27/2014   Procedure: ARTERIOVENOUS (AV) FISTULA CREATION;  Surgeon: Conrad Ravalli, MD;   Location: [facility];  Service: Vascular;  Laterality: Left;  . CHOLECYSTECTOMY    . COLONOSCOPY N/A 11/28/2014   Procedure: COLONOSCOPY;  Surgeon: Ladene Artist, MD;  Location: [facility] ENDOSCOPY;  Service: Endoscopy;  Laterality: N/A;  . ESOPHAGOGASTRODUODENOSCOPY N/A 11/28/2014   Procedure: ESOPHAGOGASTRODUODENOSCOPY (EGD);  Surgeon: Ladene Artist, MD;  Location: [facility] ENDOSCOPY;  Service: Endoscopy;  Laterality: N/A;  . EYE SURGERY     retina and cataract left eye surgery  . I&D EXTREMITY  01/28/2012   Procedure: IRRIGATION AND DEBRIDEMENT EXTREMITY;  Surgeon: Paulene Floor, MD;  Location: [facility];  Service: Orthopedics;  Laterality: Left;  irrigation and drainage left hand  . I&D EXTREMITY  01/30/2012   Procedure: IRRIGATION AND DEBRIDEMENT EXTREMITY;  Surgeon: Paulene Floor, MD;  Location: [facility];  Service: Orthopedics;  Laterality: Left;  flexor teno synovectomy and extensor tenosynovectomy arthrosynovectomy wristjoint  . I&D EXTREMITY  02/01/2012   Procedure: IRRIGATION AND DEBRIDEMENT EXTREMITY;  Surgeon: Paulene Floor, MD;  Location: [facility];  Service: Orthopedics;  Laterality: Left;  Left Wrist, Hand and Forearm  . IRRIGATION AND DEBRIDEMENT FOOT Left 01/21/2017   Procedure: DEBRIDEMENT LEFT PLANTAR WOUND;  Surgeon: Dorna Leitz, MD;  Location: [facility];  Service: Orthopedics;  Laterality: Left;  . Left 4th toe amputation    . LEFT AND RIGHT HEART CATHETERIZATION WITH CORONARY ANGIOGRAM N/A 11/26/2014   Procedure: LEFT AND RIGHT HEART CATHETERIZATION WITH CORONARY ANGIOGRAM;  Surgeon: Leonie Man, MD;  Location: [facility] CATH LAB;  Service: Cardiovascular;  Laterality: N/A;  . LOWER EXTREMITY ANGIOGRAPHY Bilateral 05/04/2018   Procedure: Lower Extremity Angiography;  Surgeon: Conrad [place], MD;  Location: [facility];  Service: Cardiovascular;  Laterality: Bilateral;  . LOWER EXTREMITY ANGIOGRAPHY Left 05/11/2018   Procedure: LOWER EXTREMITY ANGIOGRAPHY;  Surgeon: Conrad [place], MD;   Location: [facility];  Service: Cardiovascular;  Laterality: Left;  . PERIPHERAL VASCULAR BALLOON ANGIOPLASTY Left 05/11/2018   Procedure: PERIPHERAL VASCULAR BALLOON ANGIOPLASTY;  Surgeon: Conrad [place], MD;  Location: [facility];  Service: Cardiovascular;  Laterality: Left;  Posterior tibial  . PERIPHERAL VASCULAR INTERVENTION Left 05/11/2018   Procedure: PERIPHERAL VASCULAR INTERVENTION;  Surgeon: Conrad [place], MD;  Location: [facility];  Service: Cardiovascular;  Laterality: Left;  tibioperoneal trunk  . TONSILLECTOMY          Home Medications    Prior to Admission medications   Medication Sig Start Date End Date Taking? Authorizing Provider  acetaminophen (TYLENOL) 325 MG tablet Take 650 mg by mouth every 6 (six) hours as needed (pain).     [provider]  atorvastatin (LIPITOR) 20 MG tablet Take 20 mg by mouth at bedtime.     [provider]  ciprofloxacin (CIPRO) 500 MG tablet Take 1 tablet (500 mg total) by mouth daily. 10/17/18   Campbell Riches, MD  clopidogrel (PLAVIX) 75 MG tablet Take 1 tablet (75 mg total) by mouth daily. 05/16/18   Conrad [place], MD  ferric citrate [name]) 1  GM 210 MG(Fe) tablet Take 420 mg by mouth 3 (three) times daily with meals.     [provider]  glucose monitoring kit (FREESTYLE) monitoring kit 1 each by Does not apply route 4 (four) times daily - after meals and at bedtime. 1 month Diabetic Testing Supplies for QAC-QHS accuchecks.Any brand OK 11/29/14   Thurnell Lose, MD  HYDROcodone-acetaminophen (NORCO/VICODIN) 5-325 MG tablet Take 1 tablet by mouth every 6 (six) hours as needed for moderate pain. 01/27/18   Gary Fleet, PA-C  ibuprofen (ADVIL,MOTRIN) 200 MG tablet Take 400 mg by mouth as needed for moderate pain.    [provider]  insulin NPH-regular Human (NOVOLIN 70/30) (70-30) 100 UNIT/ML injection Inject 1-5 Units into the skin 2 (two) times daily with a meal. Sliding Scale     [provider]  Insulin Syringe-Needle U-100 25G X 1" 1 ML MISC Any brand, for 4 times a day insulin SQ, 1 month supply. 11/29/14   Thurnell Lose, MD  midodrine (PROAMATINE) 10 MG tablet Take 1 tablet (10 mg total) by mouth 3 (three) times daily with meals. 10/31/18   Erlene Quan, PA-C  Multiple Vitamins-Minerals (MULTIVITAMIN WITH MINERALS) tablet Take 1 tablet by mouth daily.    [provider]  mupirocin ointment (BACTROBAN) 2 % Apply 1 application topically daily as needed (rash on stomach).  08/05/16   [provider]  primidone (MYSOLINE) 50 MG tablet Take 0.5 tablets (25 mg total) by mouth at bedtime. 05/25/17   Patrecia Pour, MD  timolol (TIMOPTIC) 0.5 % ophthalmic solution Place 1 drop into the left eye 2 (two) times daily.    [provider]  vitamin C (ASCORBIC ACID) 500 MG tablet Take 500 mg by mouth 2 (two) times a week.    [provider]    Family History Family History  Problem Relation Age of Onset  . Heart disease Mother   . Diabetes Father   . Diabetes Brother   . Diabetes Sister   . Diabetes Brother   . Diabetes Brother     Social History Social History   Tobacco Use  . Smoking status: Former Smoker    Packs/day: 0.50    Years: 4.00    Pack years: 2.00    Types: Cigarettes  . Smokeless tobacco: Never Used  . Tobacco comment: about 40 yrs ago  Substance Use Topics  . Alcohol use: No    Alcohol/week: 0.0 standard drinks  . Drug use: No     Allergies   Penicillins   Review of Systems Review of Systems  Constitutional: Positive for chills and fever.  HENT: Negative for rhinorrhea and sore throat.   Eyes: Negative for visual disturbance.  Respiratory: Negative for cough and shortness of breath.   Cardiovascular: Positive for leg swelling (left leg). Negative for chest pain.  Gastrointestinal: Negative for abdominal pain, diarrhea, nausea and vomiting.  Genitourinary: Negative for dysuria, frequency and  urgency.  Musculoskeletal: Negative for joint swelling and neck pain.  Skin: Positive for wound (BL feet). Negative for rash.  Neurological: Positive for weakness (generalized). Negative for syncope and numbness.  All other systems reviewed and are negative.    Physical Exam Updated Vital Signs BP (!) 176/74   Pulse (!) 101   Temp (!) 100.6 F (38.1 C) (Rectal)   Resp (!) 22   Ht _0  (1.854 m)   Wt 97.5 kg   SpO2 99%   BMI 28.37 kg/m   Physical  Exam  Constitutional: He is oriented to person, place, and time. He appears well-developed and well-nourished.  HENT:  Head: Normocephalic and atraumatic.  Eyes: Conjunctivae and EOM are normal.  Neck: Neck supple.  Cardiovascular: Normal rate, regular rhythm and normal heart sounds.  No murmur heard. Pulmonary/Chest: Effort normal and breath sounds normal. No respiratory distress. He has no wheezes. He has no rales.  Abdominal: Soft. Bowel sounds are normal. He exhibits no distension. There is no tenderness.  Musculoskeletal: Normal range of motion. He exhibits no tenderness or deformity.  Feet:  Right Foot:  Skin Integrity: Positive for ulcer (to the plantar 2nd/3rd MTP).  Left Foot:  Skin Integrity: Positive for ulcer (to the plantar and lateral 4th/5th MTP), erythema and warmth.  Neurological: He is alert and oriented to person, place, and time.  Skin: No rash noted. There is erythema (and edema to the left lower foot/ankle).  Psychiatric: He has a normal mood and affect. His behavior is normal.  Nursing note and vitals reviewed.    ED Treatments / Results  Labs (all labs ordered are listed, but only abnormal results are displayed) Labs Reviewed  COMPREHENSIVE METABOLIC PANEL - Abnormal; Notable for the following components:      Result Value   Chloride 92 (*)    Glucose, Bld 139 (*)    Creatinine, Ser 8.28 (*)    Calcium 8.5 (*)    Total Protein 8.4 (*)    Albumin 2.8 (*)    AST 49 (*)    GFR calc non Af Amer 6  (*)    GFR calc Af Amer 7 (*)    Anion gap 17 (*)    All other components within normal limits  CBC WITH DIFFERENTIAL/PLATELET - Abnormal; Notable for the following components:   RBC 4.00 (*)    Hemoglobin 11.3 (*)    HCT 36.3 (*)    RDW 16.2 (*)    Monocytes Absolute 1.2 (*)    All other components within normal limits  I-STAT CG4 LACTIC ACID, ED - Abnormal; Notable for the following components:   Lactic Acid, Venous 2.01 (*)    All other components within normal limits  CULTURE, BLOOD (ROUTINE X 2)  CULTURE, BLOOD (ROUTINE X 2)  URINALYSIS, ROUTINE W REFLEX MICROSCOPIC  VANCOMYCIN, RANDOM  I-STAT CG4 LACTIC ACID, ED    EKG EKG Interpretation  Date/Time:  Friday November 17 2018 13:44:23 EST Ventricular Rate:  98 PR Interval:    QRS Duration: 126 QT Interval:  429 QTC Calculation: 548 R Axis:   -14 Text Interpretation:  Sinus rhythm Left bundle branch block No significant change since last tracing Confirmed by Isla Pence [phone]) on 11/17/2018 1:47:08 PM   Radiology Dg Chest Port 1 View  Result Date: 11/17/2018 CLINICAL DATA:  67 year old male with shortness of breath, weakness and sepsis EXAM: PORTABLE CHEST 1 VIEW COMPARISON:  Prior chest x-ray 05/10/2018 FINDINGS: The lungs are clear and negative for focal airspace consolidation, pulmonary edema or suspicious pulmonary nodule. No pleural effusion or pneumothorax. Cardiac and mediastinal contours are within normal limits. Atherosclerotic plaque present in the transverse aorta. No acute fracture or lytic or blastic osseous lesions. The visualized upper abdominal bowel gas pattern is unremarkable. IMPRESSION: No acute cardiopulmonary process. Aortic Atherosclerosis (ICD10-170.0) Electronically Signed   By: Jacqulynn Cadet M.D.   On: 11/17/2018 14:08    Procedures .Critical Care Performed by: Etter Sjogren, PA-C Authorized by: Etter Sjogren, PA-C   Critical care provider statement:  Critical care  time (minutes):  45   Critical care time was exclusive of:  Separately billable procedures and treating other patients   Critical care was necessary to treat or prevent imminent or life-threatening deterioration of the following conditions:  Cardiac failure, circulatory failure and sepsis   Critical care was time spent personally by me on the following activities:  Discussions with consultants, evaluation of patient's response to treatment, examination of patient, ordering and performing treatments and interventions, ordering and review of laboratory studies, ordering and review of radiographic studies, pulse oximetry, re-evaluation of patient's condition, obtaining history from patient or surrogate and review of old charts   (including critical care time)  Medications Ordered in ED Medications  acetaminophen (TYLENOL) tablet 650 mg (has no administration in time range)  sodium chloride 0.9 % bolus 500 mL (has no administration in time range)  ceFEPIme (MAXIPIME) 2 g in sodium chloride 0.9 % 100 mL IVPB (0 g Intravenous Stopped 11/17/18 1510)     Initial Impression / Assessment and Plan / ED Course  I have reviewed the triage vital signs and the nursing notes.  Pertinent labs & imaging results that were available during my care of the patient were reviewed by me and considered in my medical decision making (see chart for details).     Presented febrile.  He has elevated lactic.  He has known osteomyelitis on the left lower extremity with increased swelling and erythema concerning for cellulitis.  Code sepsis initiated.  Discussed case with the pharmacist who recommended adding Cefepime and continue vancomycin.  Fluids and Tylenol ordered.  Discussed with the hospitalist who agrees with admission.    CRITICAL CARE Critical care was necessary to treat or prevent imminent or life-threatening deterioration. Critical care was time spent personally by me on the following activities: development  of treatment plan with patient and/or surrogate as well as nursing, discussions with consultants, evaluation of patient's response to treatment, examination of patient, obtaining history from patient or surrogate, ordering and performing treatments and interventions, ordering and review of laboratory studies, ordering and review of radiographic studies, pulse oximetry and re-evaluation of patient's condition.   Final Clinical Impressions(s) / ED Diagnoses   Final diagnoses:  Sepsis, due to unspecified organism, unspecified whether acute organ dysfunction present [facility])  Cellulitis of left lower extremity  Osteomyelitis of left foot, unspecified type [facility])    ED Discharge Orders    None       Rachel Moulds 11/17/18 [place], Julie, MD 11/20/18 [phone]

## 2018-11-17 NOTE — ED Notes (Signed)
Pt given sandwich and diet ginger ale. 

## 2018-11-18 ENCOUNTER — Other Ambulatory Visit: Payer: Self-pay

## 2018-11-18 DIAGNOSIS — Z992 Dependence on renal dialysis: Secondary | ICD-10-CM

## 2018-11-18 DIAGNOSIS — R7881 Bacteremia: Secondary | ICD-10-CM

## 2018-11-18 DIAGNOSIS — Z8619 Personal history of other infectious and parasitic diseases: Secondary | ICD-10-CM

## 2018-11-18 DIAGNOSIS — E1169 Type 2 diabetes mellitus with other specified complication: Secondary | ICD-10-CM

## 2018-11-18 DIAGNOSIS — L97523 Non-pressure chronic ulcer of other part of left foot with necrosis of muscle: Secondary | ICD-10-CM

## 2018-11-18 DIAGNOSIS — M869 Osteomyelitis, unspecified: Secondary | ICD-10-CM

## 2018-11-18 DIAGNOSIS — Z8614 Personal history of Methicillin resistant Staphylococcus aureus infection: Secondary | ICD-10-CM

## 2018-11-18 DIAGNOSIS — Z89429 Acquired absence of other toe(s), unspecified side: Secondary | ICD-10-CM

## 2018-11-18 DIAGNOSIS — E11621 Type 2 diabetes mellitus with foot ulcer: Secondary | ICD-10-CM

## 2018-11-18 DIAGNOSIS — Z87891 Personal history of nicotine dependence: Secondary | ICD-10-CM

## 2018-11-18 DIAGNOSIS — B9561 Methicillin susceptible Staphylococcus aureus infection as the cause of diseases classified elsewhere: Secondary | ICD-10-CM

## 2018-11-18 DIAGNOSIS — K029 Dental caries, unspecified: Secondary | ICD-10-CM

## 2018-11-18 DIAGNOSIS — A419 Sepsis, unspecified organism: Secondary | ICD-10-CM

## 2018-11-18 DIAGNOSIS — N186 End stage renal disease: Secondary | ICD-10-CM

## 2018-11-18 DIAGNOSIS — L84 Corns and callosities: Secondary | ICD-10-CM

## 2018-11-18 DIAGNOSIS — E1122 Type 2 diabetes mellitus with diabetic chronic kidney disease: Secondary | ICD-10-CM

## 2018-11-18 LAB — CBC
HCT: 30.7 % — ABNORMAL LOW (ref 39.0–52.0)
HEMOGLOBIN: 9.6 g/dL — AB (ref 13.0–17.0)
MCH: 28.2 pg (ref 26.0–34.0)
MCHC: 31.3 g/dL (ref 30.0–36.0)
MCV: 90 fL (ref 80.0–100.0)
PLATELETS: 260 10*3/uL (ref 150–400)
RBC: 3.41 MIL/uL — ABNORMAL LOW (ref 4.22–5.81)
RDW: 16.2 % — AB (ref 11.5–15.5)
WBC: 6.2 10*3/uL (ref 4.0–10.5)
nRBC: 0 % (ref 0.0–0.2)

## 2018-11-18 LAB — BASIC METABOLIC PANEL
Anion gap: 11 (ref 5–15)
BUN: 30 mg/dL — AB (ref 8–23)
CALCIUM: 7.7 mg/dL — AB (ref 8.9–10.3)
CO2: 30 mmol/L (ref 22–32)
Chloride: 93 mmol/L — ABNORMAL LOW (ref 98–111)
Creatinine, Ser: 9.95 mg/dL — ABNORMAL HIGH (ref 0.61–1.24)
GFR calc Af Amer: 5 mL/min — ABNORMAL LOW (ref >60)
GFR, EST NON AFRICAN AMERICAN: 5 mL/min — AB (ref >60)
Glucose, Bld: 164 mg/dL — ABNORMAL HIGH (ref 70–99)
Potassium: 3.9 mmol/L (ref 3.5–5.1)
SODIUM: 134 mmol/L — AB (ref 135–145)

## 2018-11-18 LAB — GLUCOSE, CAPILLARY
GLUCOSE-CAPILLARY: 156 mg/dL — AB (ref 70–99)
GLUCOSE-CAPILLARY: 139 mg/dL — AB (ref 70–99)
GLUCOSE-CAPILLARY: 118 mg/dL — AB (ref 70–99)
Glucose-Capillary: 156 mg/dL — ABNORMAL HIGH (ref 70–99)

## 2018-11-18 LAB — APTT: APTT: 44 s — AB (ref 24–36)

## 2018-11-18 LAB — HIV ANTIBODY (ROUTINE TESTING W REFLEX): HIV Screen 4th Generation wRfx: NONREACTIVE

## 2018-11-18 MED ORDER — ACETAMINOPHEN 325 MG PO TABS
325.0000 mg | ORAL_TABLET | Freq: Once | ORAL | Status: AC
  Administered 2018-11-18: 325 mg via ORAL
  Filled 2018-11-18: qty 1

## 2018-11-18 MED ORDER — VANCOMYCIN HCL IN DEXTROSE 1-5 GM/200ML-% IV SOLN
1000.0000 mg | Freq: Once | INTRAVENOUS | Status: AC
  Administered 2018-11-18: 1000 mg via INTRAVENOUS

## 2018-11-18 MED ORDER — VANCOMYCIN HCL IN DEXTROSE 1-5 GM/200ML-% IV SOLN
INTRAVENOUS | Status: AC
  Filled 2018-11-18: qty 200

## 2018-11-18 MED ORDER — DOXERCALCIFEROL 4 MCG/2ML IV SOLN
3.0000 ug | INTRAVENOUS | Status: DC
  Administered 2018-11-19: 3 ug via INTRAVENOUS
  Filled 2018-11-18 (×2): qty 2

## 2018-11-18 NOTE — Consult Note (Addendum)
[facility] Renal Consultation Note    Indication for Consultation:  Management of ESRD/hemodialysis; anemia, hypertension/volume and secondary hyperparathyroidism   HPI: [name] HAGEDORN Sr. is a 67 y.o. male with ESRD on HD MWF [facility]. PMH also significant for HTN, NICM, DMT2,diabetic neuropathy, PAD, chronic bilateral foot wounds x 3 years with hx of osteomyelitis, L toe amputations. Followed by wound care and ID. Have been trying to manage wounds in outpatient setting. Was started on 6 weeks of PO cipro per ID on 10/22.   Fevers/chills beginning starting 4-5 days ago. He had blood cultures drawn at his outpatient dialysis center on 11/19 which were positive for MRSA. IV Vancomycin also started 11/19. He was unable to complete dialysis yesterday with fever/rigors during treatment and was sent to ED for further evaluation.  Admitted with concern for sepsis. VS/Labs stable this am. On Vanc/cefepime. ID/Ortho consult pending.   Seen and examined at bedside. Alert, feels weak. Denies CP, SOB, N,V,D.   Past Medical History:  Diagnosis Date  . Cardiomyopathy ([place])   . Cataract    right eye  . Diabetes mellitus    type 2  . Diabetic foot ulcer ([place])   . Diabetic infection of right foot ([place])   . Diabetic peripheral neuropathy ([place])   . ESRD (end stage renal disease) ([place])    m-w-f [address]  . Glaucoma    left eye  . Headache   . HTN (hypertension)   . Osteomyelitis ([place])   . Peripheral vascular disease ([place])    critical limb ischemia   Past Surgical History:  Procedure Laterality Date  . ABDOMINAL AORTOGRAM N/A 05/04/2018   Procedure: ABDOMINAL AORTOGRAM;  Surgeon: Conrad [place], MD;  Location: [place] [facility];  Service: Cardiovascular;  Laterality: N/A;  . AMPUTATION TOE Right 01/21/2017   Procedure: AMPUTATION 2ND TOE RIGHT FOOT ;  Surgeon: Dorna Leitz, MD;  Location: [place];  Service: Orthopedics;  Laterality:  Right;  . AMPUTATION TOE Left 05/18/2017   Procedure: 5TH RAY AMPUTATION  and EXCISION CUBOID;  Surgeon: Dorna Leitz, MD;  Location: [place];  Service: Orthopedics;  Laterality: Left;  . AMPUTATION TOE Bilateral 01/27/2018   Procedure: RIGHT FOOT 3RD TOE AMPUTATION WITH INCISIONAL WOUND DEBRIDEMENT. LEFT FOOT 2ND TOE AMPUTATION AND INCISIONAL WOUND DEBRIDEMENT.;  Surgeon: Dorna Leitz, MD;  Location: [facility];  Service: Orthopedics;  Laterality: Bilateral;  . AV FISTULA PLACEMENT Left 11/27/2014   Procedure: ARTERIOVENOUS (AV) FISTULA CREATION;  Surgeon: Conrad Wilmington, MD;  Location: [place];  Service: Vascular;  Laterality: Left;  . CHOLECYSTECTOMY    . COLONOSCOPY N/A 11/28/2014   Procedure: COLONOSCOPY;  Surgeon: Ladene Artist, MD;  Location: [facility] ENDOSCOPY;  Service: Endoscopy;  Laterality: N/A;  . ESOPHAGOGASTRODUODENOSCOPY N/A 11/28/2014   Procedure: ESOPHAGOGASTRODUODENOSCOPY (EGD);  Surgeon: Ladene Artist, MD;  Location: [facility] ENDOSCOPY;  Service: Endoscopy;  Laterality: N/A;  . EYE SURGERY     retina and cataract left eye surgery  . I&D EXTREMITY  01/28/2012   Procedure: IRRIGATION AND DEBRIDEMENT EXTREMITY;  Surgeon: Paulene Floor, MD;  Location: [facility];  Service: Orthopedics;  Laterality: Left;  irrigation and drainage left hand  . I&D EXTREMITY  01/30/2012   Procedure: IRRIGATION AND DEBRIDEMENT EXTREMITY;  Surgeon: Paulene Floor, MD;  Location: [facility];  Service: Orthopedics;  Laterality: Left;  flexor teno synovectomy and extensor tenosynovectomy arthrosynovectomy wristjoint  . I&D EXTREMITY  02/01/2012   Procedure: IRRIGATION  AND DEBRIDEMENT EXTREMITY;  Surgeon: Paulene Floor, MD;  Location: [facility];  Service: Orthopedics;  Laterality: Left;  Left Wrist, Hand and Forearm  . IRRIGATION AND DEBRIDEMENT FOOT Left 01/21/2017   Procedure: DEBRIDEMENT LEFT PLANTAR WOUND;  Surgeon: Dorna Leitz, MD;  Location: [place];  Service: Orthopedics;  Laterality: Left;  . Left 4th toe amputation     . LEFT AND RIGHT HEART CATHETERIZATION WITH CORONARY ANGIOGRAM N/A 11/26/2014   Procedure: LEFT AND RIGHT HEART CATHETERIZATION WITH CORONARY ANGIOGRAM;  Surgeon: Leonie Man, MD;  Location: [facility] CATH LAB;  Service: Cardiovascular;  Laterality: N/A;  . LOWER EXTREMITY ANGIOGRAPHY Bilateral 05/04/2018   Procedure: Lower Extremity Angiography;  Surgeon: Conrad [place], MD;  Location: [place] [facility];  Service: Cardiovascular;  Laterality: Bilateral;  . LOWER EXTREMITY ANGIOGRAPHY Left 05/11/2018   Procedure: LOWER EXTREMITY ANGIOGRAPHY;  Surgeon: Conrad Ruma, MD;  Location: [place] [facility];  Service: Cardiovascular;  Laterality: Left;  . PERIPHERAL VASCULAR BALLOON ANGIOPLASTY Left 05/11/2018   Procedure: PERIPHERAL VASCULAR BALLOON ANGIOPLASTY;  Surgeon: Conrad March ARB, MD;  Location: [place] [facility];  Service: Cardiovascular;  Laterality: Left;  Posterior tibial  . PERIPHERAL VASCULAR INTERVENTION Left 05/11/2018   Procedure: PERIPHERAL VASCULAR INTERVENTION;  Surgeon: Conrad [place], MD;  Location: [place] [facility];  Service: Cardiovascular;  Laterality: Left;  tibioperoneal trunk  . TONSILLECTOMY     Family History  Problem Relation Age of Onset  . Heart disease Mother   . Diabetes Father   . Diabetes Brother   . Diabetes Sister   . Diabetes Brother   . Diabetes Brother    Social History:  reports that he has quit smoking. His smoking use included cigarettes. He has a 2.00 pack-year smoking history. He has never used smokeless tobacco. He reports that he does not drink alcohol or use drugs. Allergies  Allergen Reactions  . Penicillins Hives, Swelling and Other (See Comments)    Ankle swelling Has patient had a PCN reaction causing immediate rash, facial/tongue/throat swelling, SOB or lightheadedness with hypotension: Yes Has patient had a PCN reaction causing severe rash involving mucus membranes or skin necrosis: No Has patient had a PCN reaction that required hospitalization:  No Has patient had a PCN reaction occurring within the last 10 years: No If all of the above answers are "NO", then may proceed with Cephalosporin use.   Prior to Admission medications   Medication Sig Start Date End Date Taking? Authorizing Provider  acetaminophen (TYLENOL) 325 MG tablet Take 650 mg by mouth every 6 (six) hours as needed (pain).    Yes [provider]  atorvastatin (LIPITOR) 40 MG tablet Take 40 mg by mouth at bedtime.    Yes [provider]  ciprofloxacin (CIPRO) 500 MG tablet Take 1 tablet (500 mg total) by mouth daily. 10/17/18  Yes Campbell Riches, MD  clopidogrel (PLAVIX) 75 MG tablet Take 1 tablet (75 mg total) by mouth daily. Patient taking differently: Take 75 mg by mouth at bedtime.  05/16/18  Yes Conrad [place], MD  ferric citrate (AURYXIA) 1 GM 210 MG(Fe) tablet Take 630 mg by mouth 3 (three) times daily with meals.    Yes [provider]  ibuprofen (ADVIL,MOTRIN) 200 MG tablet Take 400 mg by mouth daily as needed for headache (pain).    Yes [provider]  insulin NPH-regular Human (NOVOLIN 70/30) (70-30) 100 UNIT/ML injection Inject 5-6 Units into the skin 2 (two) times daily  with a meal. Sliding Scale - based on CBG   Yes [provider]  midodrine (PROAMATINE) 5 MG tablet Take 10 mg by mouth 2 (two) times daily with a meal. 10/28/18  Yes [provider]  Multiple Vitamins-Minerals (MULTIVITAMIN WITH MINERALS) tablet Take 1 tablet by mouth daily with supper.    Yes [provider]  mupirocin ointment (BACTROBAN) 2 % Apply 1 application topically daily as needed (rassh).  08/05/16  Yes [provider]  primidone (MYSOLINE) 50 MG tablet Take 0.5 tablets (25 mg total) by mouth at bedtime. Patient taking differently: Take 50 mg by mouth at bedtime.  05/25/17  Yes Patrecia Pour, MD  timolol (TIMOPTIC) 0.5 % ophthalmic solution Place 1 drop into the left eye 2 (two) times daily.   Yes [provider]  glucose monitoring kit (FREESTYLE) monitoring kit 1 each by Does not apply route 4 (four) times daily - after meals and at bedtime. 1 month Diabetic Testing Supplies for QAC-QHS accuchecks.Any brand OK 11/29/14   Thurnell Lose, MD  HYDROcodone-acetaminophen (NORCO/VICODIN) 5-325 MG tablet Take 1 tablet by mouth every 6 (six) hours as needed for moderate pain. Patient not taking: Reported on 11/17/2018 01/27/18   Gary Fleet, PA-C  Insulin Syringe-Needle U-100 25G X 1" 1 ML MISC Any brand, for 4 times a day insulin SQ, 1 month supply. 11/29/14   Thurnell Lose, MD  midodrine (PROAMATINE) 10 MG tablet Take 1 tablet (10 mg total) by mouth 3 (three) times daily with meals. Patient not taking: Reported on 11/17/2018 10/31/18   Erlene Quan, PA-C   Current Facility-Administered Medications  Medication Dose Route Frequency Provider Last Rate Last Dose  . acetaminophen (TYLENOL) tablet 650 mg  650 mg Oral Q6H PRN [name], MD      . atorvastatin (LIPITOR) tablet 40 mg  40 mg Oral QHS [name], MD   40 mg at 11/17/18 2153  . ceFEPIme (MAXIPIME) 1 g in sodium chloride 0.9 % 100 mL IVPB  1 g Intravenous Q24H Willia Craze, Student-PharmD      . clopidogrel (PLAVIX) tablet 75 mg  75 mg Oral Daily [name], MD   75 mg at 11/18/18 0943  . ferric citrate (AURYXIA) tablet 630 mg  630 mg Oral TID WC [name], MD   630 mg at 11/18/18 0824  . heparin injection 5,000 Units  5,000 Units Subcutaneous Q8H [name], MD   5,000 Units at 11/18/18 [phone]  . insulin aspart (novoLOG) injection 0-9 Units  0-9 Units Subcutaneous TID WC [name], MD   2 Units at 11/18/18 0825  . insulin aspart protamine- aspart (NOVOLOG MIX 70/30) injection 5 Units  5 Units Subcutaneous BID WC [name], MD   5 Units at 11/18/18 0826  . midodrine (PROAMATINE) tablet 10 mg  10 mg Oral BID WC [name], MD   10 mg at 11/18/18 0824  . multivitamin with minerals  tablet 1 tablet  1 tablet Oral Q supper [name], MD      . mupirocin ointment (BACTROBAN) 2 % 1 application  1 application Topical Daily PRN [name], MD      . primidone (MYSOLINE) tablet 50 mg  50 mg Oral QHS [name], MD   50 mg at 11/17/18 2153  . timolol (TIMOPTIC) 0.5 % ophthalmic solution 1 drop  1 drop Left Eye BID [name], MD   1 drop at 11/18/18 0943  . vancomycin variable dose per unstable  renal function (pharmacist dosing)   Does not apply See admin instructions Rolla Flatten, RPH        ROS: As per HPI otherwise negative.  Physical Exam: Vitals:   11/17/18 1839 11/17/18 2217 11/18/18 0414 11/18/18 0806  BP: 128/60 132/70 123/78 138/64  Pulse: 79 75 76 76  Resp: _0 Temp: 99.9 F (37.7 C) 99.5 F (37.5 C) 99.5 F (37.5 C) 99.8 F (37.7 C)  TempSrc: Oral Oral Oral Oral  SpO2: 96% 98% 98% 97%  Weight:      Height:         General: Chronically ill appearing NAD  Head: NCAT sclera not icteric MMM Neck: Supple. No JVD  Lungs: CTA bilaterally without wheezes, rales, or rhonchi.  Heart: RRR with S1 S2 Abdomen: soft NT + BS Lower extremities: bilat heel wounds bandaged L foot ischemic changes s/p toe amps. Trace LE edema  Neuro: A & O  X 3. Moves all extremities spontaneously. Psych:  Responds to questions appropriately with a normal affect. Dialysis Access:LUE AVF +bruit   Labs: Basic Metabolic Panel: Recent Labs  Lab 11/17/18 1346 11/18/18 0622  NA 136 134*  K 3.5 3.9  CL 92* 93*  CO2 27 30  GLUCOSE 139* 164*  BUN 23 30*  CREATININE 8.28* 9.95*  CALCIUM 8.5* 7.7*   Liver Function Tests: Recent Labs  Lab 11/17/18 1346  AST 49*  ALT 37  ALKPHOS 54  BILITOT 0.9  PROT 8.4*  ALBUMIN 2.8*   No results for input(s): LIPASE, AMYLASE in the last 168 hours. No results for input(s): AMMONIA in the last 168 hours. CBC: Recent Labs  Lab 11/17/18 1346 11/18/18 0622  WBC 9.4 6.2  NEUTROABS 6.4  --   HGB  11.3* 9.6*  HCT 36.3* 30.7*  MCV 90.8 90.0  PLT 278 260   Cardiac Enzymes: No results for input(s): CKTOTAL, CKMB, CKMBINDEX, TROPONINI in the last 168 hours. CBG: Recent Labs  Lab 11/17/18 1830 11/17/18 2217 11/18/18 0807 11/18/18 1204  GLUCAP 218* 211* 156* 156*   Iron Studies: No results for input(s): IRON, TIBC, TRANSFERRIN, FERRITIN in the last 72 hours. Studies/Results: Dg Chest Port 1 View  Result Date: 11/17/2018 CLINICAL DATA:  67 year old male with shortness of breath, weakness and sepsis EXAM: PORTABLE CHEST 1 VIEW COMPARISON:  Prior chest x-ray 05/10/2018 FINDINGS: The lungs are clear and negative for focal airspace consolidation, pulmonary edema or suspicious pulmonary nodule. No pleural effusion or pneumothorax. Cardiac and mediastinal contours are within normal limits. Atherosclerotic plaque present in the transverse aorta. No acute fracture or lytic or blastic osseous lesions. The visualized upper abdominal bowel gas pattern is unremarkable. IMPRESSION: No acute cardiopulmonary process. Aortic Atherosclerosis (ICD10-170.0) Electronically Signed   By: Jacqulynn Cadet M.D.   On: 11/17/2018 14:08    Dialysis Orders:  [facility] MWF 4.25h BFR 500 EDW 95.5kg 2K/2.25Ca  Prof 2 AVF No Heparin -Hectorol 3 mcg IV TIW -Parsabiv 7.5 mg IV TIW   Assessment/Plan: 1. MRSA bacteremia - most likely 2/2 Chronic bilateral foot wounds Hx osteomyelitis of L foot. . Blood cultures drawn at outpatient kidney center 11/19 +MRSA. Repeat cultures pending here. ID following. On vanc.  2. ESRD -  MWF. Unable to complete HD Friday. Short HD today then back on schedule for HD tomorrow. Holiday schedule this week.  3. Hypertension/volume  - BP stable. No gross volume overload on exam. UF to EDW as tolerated.  4. Anemia  - hgb 9.6. Follow  trends. Not currently on ESA  5. Metabolic bone disease -  Continue Hectorol/Auryxia binder. No Parsabiv here.  6. Nutrition - Renal diet/vitamins 7. NICM EF  55-60% in 02/2018  8. PAD - s/p L tibial angioplasty/stenting 04/2018.  Followed Dr. Scot Dock.  On Plavix 9. DM T2   Lynnda Child PA-C [facility] Pager [phone] 11/18/2018, 12:18 PM   Pt seen, examined and agree w A/P as above. ESRD pt w/ draining chronic foot wound and new outpt blood cx's + for MRSA, admitted for IV abx and further w/u.  Stable vol and lytes, plan HD as above.  Kelly Splinter MD [employer] pager [phone]   11/18/2018, 1:45 PM

## 2018-11-18 NOTE — Procedures (Signed)
   I was present at this dialysis session, have reviewed the session itself and made  appropriate changes Kelly Splinter MD [place] pager [phone]   11/18/2018, 5:07 PM

## 2018-11-18 NOTE — Progress Notes (Signed)
PROGRESS NOTE                                                                                                                                                                                                             Patient Demographics:    [name], is a 67 y.o. male, DOB - 01-Jul-1951, IWP:[id number]  Admit date - 11/17/2018   Admitting Physician [name], MD  Outpatient Primary MD for the patient is [name], MD  LOS - 1   No chief complaint on file.      Brief Narrative   67 y.o. male  with past medical history of diabetes end-stage renal disease on hemodialysis Monday Wednesday on Friday type 2 diabetes mellitus hypertension cardiomyopathy severe peripheral vascular disease multiple toe amputations bilaterally.  Bilateral foot ulcer, sent by hemodialysis centers for positive blood cultures for Staphylococcus, patient is with known chronic osteomyelitis, chronic left lower extremity wound.    Subjective:    [name] today has, No headache, No chest pain, No abdominal pain -reported generalized weakness  Assessment  & Plan :    Principal Problem:   Sepsis ([place]) Active Problems:   HTN (hypertension)   Anemia due to chronic blood loss   Osteomyelitis of left foot ([place])   Type 2 diabetes mellitus with left diabetic foot ulcer ([place])   ESRD on hemodialysis ([place])   Diabetic ulcer of foot associated with diabetes mellitus due to underlying condition, with necrosis of muscle ([place])  MRSA bacteremia -He had blood culture positive for staph at hemodialysis center 11/14/2018, admitted for further work-up, following repeat blood cultures 11/17/2018. -Most likely source infected left foot, ID input appreciated, will obtain MRI of both feet, patient will need TEE, continue with IV vancomycin.  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  Followed Dr. Scot Dock.  On Plavix  Diabetes mellitus type 2 -Continue with insulin sliding scale  Anemia Of chronic kidney disease -Hemoglobin 9.6 ,continue to monitor closely       Code Status : Full  Family Communication  : none at bedside  Disposition Plan  :  pending further work up  Consults  :  Renal/ID  Procedures  : none  DVT Prophylaxis  :  Decherd heaprin  Lab Results  Component Value Date   PLT 260 11/18/2018    Antibiotics  :    Anti-infectives (From admission, onward)   Start     Dose/Rate Route Frequency Ordered Stop   11/18/18 2000  ceFEPIme (MAXIPIME) 1 g in sodium chloride 0.9 % 100 mL IVPB     1 g 200 mL/hr over 30 Minutes Intravenous Every 24 hours 11/17/18 1808     11/17/18 2200  ceFEPIme (MAXIPIME) 2 g in sodium chloride 0.9 % 100 mL IVPB  Status:  Discontinued     2 g 200 mL/hr over 30 Minutes Intravenous Every 8 hours 11/17/18 1827 11/17/18 1829   11/17/18 1826  vancomycin variable dose per unstable renal function (pharmacist dosing)      Does not apply See admin instructions 11/17/18 1826     11/17/18 1400  ceFEPIme (MAXIPIME) 2 g in sodium chloride 0.9 % 100 mL IVPB     2 g 200 mL/hr over 30 Minutes Intravenous  Once 11/17/18 1353 11/17/18 1510        Objective:   Vitals:   11/17/18 1839 11/17/18 2217 11/18/18 0414 11/18/18 0806  BP: 128/60 132/70 123/78 138/64  Pulse: 79 75 76 76  Resp: 19 16 18 18   Temp: 99.9 F (37.7 C) 99.5 F (37.5 C) 99.5 F (37.5 C) 99.8 F (37.7 C)  TempSrc: Oral Oral Oral Oral  SpO2: 96% 98% 98% 97%  Weight:      Height:        Wt Readings from Last 3 Encounters:  11/17/18 97.5 kg  11/08/18 99.3 kg  10/31/18 97 kg     Intake/Output Summary (Last 24 hours) at 11/18/2018 1348 Last data filed at  11/18/2018 0900 Gross per 24 hour  Intake 702 ml  Output -  Net 702 ml     Physical Exam  Awake Alert, Oriented X 3, No new F.N deficits, Normal affect Symmetrical Chest wall movement, Good air movement bilaterally, CTAB RRR,No Gallops,Rubs or new Murmurs, No Parasternal Heave +ve B.Sounds, Abd Soft, No tenderness, No rebound - guarding or rigidity. Left lower extremity with lateral wound, foul-smelling odor,    Data Review:    CBC Recent Labs  Lab 11/17/18 1346 11/18/18 0622  WBC 9.4 6.2  HGB 11.3* 9.6*  HCT 36.3* 30.7*  PLT 278 260  MCV 90.8 90.0  MCH 28.3 28.2  MCHC 31.1 31.3  RDW 16.2* 16.2*  LYMPHSABS 1.5  --   MONOABS 1.2*  --   EOSABS 0.3  --   BASOSABS 0.0  --     Chemistries  Recent Labs  Lab 11/17/18 1346 11/18/18 0622  NA 136 134*  K 3.5 3.9  CL 92* 93*  CO2 27 30  GLUCOSE 139* 164*  BUN 23 30*  CREATININE 8.28* 9.95*  CALCIUM 8.5* 7.7*  AST 49*  --   ALT 37  --   ALKPHOS 54  --   BILITOT 0.9  --    ------------------------------------------------------------------------------------------------------------------ No results for input(s): CHOL, HDL, LDLCALC, TRIG, CHOLHDL, LDLDIRECT in the last 72 hours.  Lab Results  Component Value Date   HGBA1C 6.7 (H) 05/16/2017   ------------------------------------------------------------------------------------------------------------------ No results for input(s): TSH, T4TOTAL, T3FREE, THYROIDAB in the last 72 hours.  Invalid input(s): FREET3 ------------------------------------------------------------------------------------------------------------------ No results for input(s): VITAMINB12, FOLATE, FERRITIN, TIBC, IRON, RETICCTPCT in the last 72 hours.  Coagulation profile No results for input(s): INR, PROTIME in the last 168 hours.  No results for input(s): DDIMER in the last 72 hours.  Cardiac Enzymes No results for input(s): CKMB, TROPONINI, MYOGLOBIN in the last 168 hours.  Invalid  input(s): CK ------------------------------------------------------------------------------------------------------------------ No results found for: BNP  Inpatient Medications  Scheduled Meds: . atorvastatin  40 mg Oral QHS  . clopidogrel  75 mg Oral Daily  . [START ON 11/20/2018] doxercalciferol  3 mcg Intravenous Q M,W,F-HD  . ferric citrate  630 mg Oral TID WC  . heparin  5,000 Units Subcutaneous Q8H  . insulin aspart  0-9 Units Subcutaneous TID WC  . insulin aspart protamine- aspart  5 Units Subcutaneous BID WC  . midodrine  10 mg Oral BID WC  . multivitamin with minerals  1 tablet Oral Q supper  . primidone  50 mg Oral QHS  . timolol  1 drop Left Eye BID  . vancomycin variable dose per unstable renal function (pharmacist dosing)   Does not apply See admin instructions   Continuous Infusions: . ceFEPime (MAXIPIME) IV     PRN Meds:.acetaminophen, mupirocin ointment  Micro Results Recent Results (from the past 240 hour(s))  Aerobic Culture (superficial specimen)     Status: None   Collection Time: 11/09/18 12:00 PM  Result Value Ref Range Status   Specimen Description   Final    FOOT RIGHT PLANTAR Performed at [facility] [address], [place], [place] [address]    Special Requests   Final    NONE Performed at [facility], [place] [address], [place], [place] [address]    Gram Stain   Final    MODERATE WBC PRESENT, PREDOMINANTLY PMN FEW GRAM POSITIVE COCCI Performed at [facility], [place] [address] [place], [place] [address]    Culture   Final    ABUNDANT METHICILLIN RESISTANT STAPHYLOCOCCUS AUREUS   Report Status 11/12/2018 FINAL  Final   Organism ID, Bacteria METHICILLIN RESISTANT STAPHYLOCOCCUS AUREUS  Final      Susceptibility   Methicillin resistant staphylococcus aureus - MIC*    CIPROFLOXACIN >=8 RESISTANT Resistant     ERYTHROMYCIN >=8 RESISTANT Resistant     GENTAMICIN <=0.5 SENSITIVE Sensitive     OXACILLIN  >=4 RESISTANT Resistant     TETRACYCLINE >=16 RESISTANT Resistant     VANCOMYCIN 1 SENSITIVE Sensitive     TRIMETH/SULFA >=320 RESISTANT Resistant     CLINDAMYCIN >=8 RESISTANT Resistant     RIFAMPIN <=0.5 SENSITIVE Sensitive     Inducible Clindamycin NEGATIVE Sensitive     * ABUNDANT METHICILLIN RESISTANT STAPHYLOCOCCUS AUREUS    Radiology Reports Dg Chest Port 1 View  Result Date: 11/17/2018 CLINICAL DATA:  67 year old male with shortness of breath, weakness and sepsis EXAM: PORTABLE CHEST 1 VIEW COMPARISON:  Prior chest x-ray 05/10/2018 FINDINGS: The lungs are clear and negative for focal airspace consolidation, pulmonary edema or suspicious pulmonary nodule. No pleural effusion or pneumothorax. Cardiac and mediastinal contours are within normal limits. Atherosclerotic plaque present in the transverse aorta. No acute fracture or lytic or blastic osseous lesions. The visualized upper abdominal bowel gas pattern is unremarkable. IMPRESSION: No acute cardiopulmonary process. Aortic Atherosclerosis (ICD10-170.0) Electronically Signed   By: Jacqulynn Cadet M.D.   On: 11/17/2018 14:08   Vas [place] Burnard Bunting With/wo Tbi  Result Date: 11/08/2018 LOWER EXTREMITY DOPPLER STUDY Indications: Peripheral artery disease. High Risk Factors: Hypertension, insulin dependent diabetes mellitus, Diabetes,  past history of smoking.  Vascular               Left AVF Interventions:         Angioplasty and stent of left tibioperoneal trunk                        05/11/2018. Limitations: Patient refused bandage wrappings of the right lower extremity to              be removed. Unable to perform right ABIs. Performing Technologist: Ronal Fear RVS, RCS  Examination Guidelines: A complete evaluation includes at minimum, Doppler waveform signals and systolic blood pressure reading at the level of bilateral brachial, anterior tibial, and posterior tibial arteries, when vessel segments are accessible. Bilateral  testing is considered an integral part of a complete examination. Photoelectric Plethysmograph (PPG) waveforms and toe systolic pressure readings are included as required and additional duplex testing as needed. Limited examinations for reoccurring indications may be performed as noted.  ABI Findings: +--------+------------------+-----+--------+--------+ Right   Rt Pressure (mmHg)IndexWaveformComment  +--------+------------------+-----+--------+--------+ [id number]                                     +--------+------------------+-----+--------+--------+ +----+------------------+-----+--------+-------+ LeftLt Pressure (mmHg)IndexWaveformComment +----+------------------+-----+--------+-------+ PTA 173               1.10 biphasic        +----+------------------+-----+--------+-------+ DP  138               0.88 biphasic        +----+------------------+-----+--------+-------+ +-------+-----------+-----------+------------+------------+ ABI/TBIToday's ABIToday's TBIPrevious ABIPrevious TBI +-------+-----------+-----------+------------+------------+ Right                        1.43        0.41         +-------+-----------+-----------+------------+------------+ Left   1.10       0.88       1.21        0.32         +-------+-----------+-----------+------------+------------+ Arterial wall calcification precludes accurate ankle pressures and ABIs. Circumferential wall calcification observed on 2D imaging.  Summary: Left: Although ankle brachial indices are within normal limits (0.95-1.29), arterial Doppler waveforms at the ankle suggest some component of arterial occlusive disease.  *See table(s) above for measurements and observations.  Electronically signed by Deitra Mayo MD on 11/08/2018 at 10:00:28 AM.   Final    Vas [place] Lower Extremity Arterial Duplex  Result Date: 11/08/2018 LOWER EXTREMITY ARTERIAL DUPLEX STUDY Indications: Peripheral artery disease. High  Risk Factors: Hypertension, insulin dependent diabetes mellitus, Diabetes.  Vascular               Left AVF Interventions:         Angioplasty and stent of left tibioperoneal trunk                        05/11/2018. Current ABI:           R=not performed, L=1.10 Performing Technologist: Ronal Fear RVS, RCS  Examination Guidelines: A complete evaluation includes B-mode imaging, spectral Doppler, color Doppler, and power Doppler as needed of all accessible portions of each vessel. Bilateral testing is considered an integral part of a complete examination. Limited examinations for reoccurring indications may be performed as noted.  Left Duplex Findings: +-----------+--------+-----+--------+--------+--------+  PSV cm/sRatioStenosisWaveformComments +-----------+--------+-----+--------+--------+--------+ CFA Distal 130                  biphasic         +-----------+--------+-----+--------+--------+--------+ DFA        99                   biphasic         +-----------+--------+-----+--------+--------+--------+ SFA Prox   122                  biphasic         +-----------+--------+-----+--------+--------+--------+ SFA Mid    149                  biphasic         +-----------+--------+-----+--------+--------+--------+ SFA Distal 105                  biphasic         +-----------+--------+-----+--------+--------+--------+ POP Prox   129                  biphasic         +-----------+--------+-----+--------+--------+--------+ POP Distal 151                  biphasic         +-----------+--------+-----+--------+--------+--------+ ATA Prox   80                   biphasic         +-----------+--------+-----+--------+--------+--------+ ATA Mid    64                   biphasic         +-----------+--------+-----+--------+--------+--------+ PTA Prox   166                  biphasic          +-----------+--------+-----+--------+--------+--------+ PTA Mid    156                  biphasic         +-----------+--------+-----+--------+--------+--------+ PTA Distal 144                  biphasic         +-----------+--------+-----+--------+--------+--------+ PERO Prox                       absent           +-----------+--------+-----+--------+--------+--------+ PERO Mid                        absent           +-----------+--------+-----+--------+--------+--------+ PERO Distal                     absent           +-----------+--------+-----+--------+--------+--------+ DP         58                   biphasic         +-----------+--------+-----+--------+--------+--------+  Summary: Left: Patent left lower extremity arterial system and left tibioperoneal artery stent without evidence of hemodynamically significant stenosis. Circumferential wall calcification is observed throughout the left lower extremity.  See table(s) above for measurements and observations. Electronically signed by Deitra Mayo MD on 11/08/2018 at 9:42:03 AM.    Final      Phillips Climes M.D on 11/18/2018 at 1:48  PM  Between 7am to 7pm - Pager - [phone]  After 7pm go to [email or web address] - password [facility]  [facility] -  Office  [phone]

## 2018-11-18 NOTE — Consult Note (Signed)
[facility] for Infectious Disease    Date of Admission:  11/17/2018   Total days of antibiotics: 1 vanco               Reason for Consult: Staph bacteremia, Ostoemyelitis of L foot.    Referring Provider:  [name]   Assessment: Staph bacteremia Osteomyelitis of L foot ESRD DM2  Plan: 1. Await his repeat BCx from 11-22 2. Need his BCx from HD 3. Continue vanco 4. He will need TEE 5. His fistula appears clean 6. HD f/u 7. Would have ortho see him  8. Needs MRI of his L foot as well as R 9. Diabetic control  Thank you so much for this interesting consult,  Principal Problem:   Sepsis ([place]) Active Problems:   HTN (hypertension)   Anemia due to chronic blood loss   Osteomyelitis of left foot (HCC)   Type 2 diabetes mellitus with left diabetic foot ulcer ([place])   ESRD on hemodialysis ([place])   Diabetic ulcer of foot associated with diabetes mellitus due to underlying condition, with necrosis of muscle ([place])   . atorvastatin  40 mg Oral QHS  . clopidogrel  75 mg Oral Daily  . ferric citrate  630 mg Oral TID WC  . heparin  5,000 Units Subcutaneous Q8H  . insulin aspart  0-9 Units Subcutaneous TID WC  . insulin aspart protamine- aspart  5 Units Subcutaneous BID WC  . midodrine  10 mg Oral BID WC  . multivitamin with minerals  1 tablet Oral Q supper  . primidone  50 mg Oral QHS  . timolol  1 drop Left Eye BID  . vancomycin variable dose per unstable renal function (pharmacist dosing)   Does not apply See admin instructions    HPI: [name]. is a 67 y.o. male with long hx of R and L foot wound problems. He has been offered amputation several times. He has had toe amputations on several occasions in the past. He has been followed at [facility].  His prior Cx have been MRSA, Enterobacter, Klebsiella.  He states that he has had fatigue and fevers for the last 2 weeks. He has been taking tylenol to control this. He was started on vanco at HD on 11-19.    Yesterday at HD he was given tylenol however during HD he had fever and was sent to hospital.    Review of Systems: Review of Systems  Constitutional: Positive for fever and malaise/fatigue.  Respiratory: Negative for shortness of breath.   Gastrointestinal: Negative for constipation and diarrhea.  no problems with HD fistula.  He has not noted wound odor.  Please see HPI. All other systems reviewed and negative.   Past Medical History:  Diagnosis Date  . Cardiomyopathy ([place])   . Cataract    right eye  . Diabetes mellitus    type 2  . Diabetic foot ulcer ([place])   . Diabetic infection of right foot ([place])   . Diabetic peripheral neuropathy ([place])   . ESRD (end stage renal disease) ([place])    m-w-f [address]  . Glaucoma    left eye  . Headache   . HTN (hypertension)   . Osteomyelitis ([place])   . Peripheral vascular disease ([place])    critical limb ischemia    Social History   Tobacco Use  . Smoking status: Former Smoker    Packs/day: 0.50    Years: 4.00  Pack years: 2.00    Types: Cigarettes  . Smokeless tobacco: Never Used  . Tobacco comment: about 40 yrs ago  Substance Use Topics  . Alcohol use: No    Alcohol/week: 0.0 standard drinks  . Drug use: No    Family History  Problem Relation Age of Onset  . Heart disease Mother   . Diabetes Father   . Diabetes Brother   . Diabetes Sister   . Diabetes Brother   . Diabetes Brother      Medications:  Scheduled: . atorvastatin  40 mg Oral QHS  . clopidogrel  75 mg Oral Daily  . ferric citrate  630 mg Oral TID WC  . heparin  5,000 Units Subcutaneous Q8H  . insulin aspart  0-9 Units Subcutaneous TID WC  . insulin aspart protamine- aspart  5 Units Subcutaneous BID WC  . midodrine  10 mg Oral BID WC  . multivitamin with minerals  1 tablet Oral Q supper  . primidone  50 mg Oral QHS  . timolol  1 drop Left Eye BID  . vancomycin variable dose per unstable renal function (pharmacist  dosing)   Does not apply See admin instructions    Abtx:  Anti-infectives (From admission, onward)   Start     Dose/Rate Route Frequency Ordered Stop   11/18/18 2000  ceFEPIme (MAXIPIME) 1 g in sodium chloride 0.9 % 100 mL IVPB     1 g 200 mL/hr over 30 Minutes Intravenous Every 24 hours 11/17/18 1808     11/17/18 2200  ceFEPIme (MAXIPIME) 2 g in sodium chloride 0.9 % 100 mL IVPB  Status:  Discontinued     2 g 200 mL/hr over 30 Minutes Intravenous Every 8 hours 11/17/18 1827 11/17/18 1829   11/17/18 1826  vancomycin variable dose per unstable renal function (pharmacist dosing)      Does not apply See admin instructions 11/17/18 1826     11/17/18 1400  ceFEPIme (MAXIPIME) 2 g in sodium chloride 0.9 % 100 mL IVPB     2 g 200 mL/hr over 30 Minutes Intravenous  Once 11/17/18 1353 11/17/18 1510        OBJECTIVE: Blood pressure 138/64, pulse 76, temperature 99.8 F (37.7 C), temperature source Oral, resp. rate 18, height '6\' 1"'  (1.854 m), weight 97.5 kg, SpO2 97 %.  Physical Exam  Constitutional: He appears well-developed and well-nourished.  HENT:  Mouth/Throat: Abnormal dentition. Dental caries present. No oropharyngeal exudate.  Eyes: Pupils are equal, round, and reactive to light. EOM are normal.  Neck: Normal range of motion. Neck supple.  Cardiovascular: Normal rate, regular rhythm and normal heart sounds.  Pulmonary/Chest: Effort normal and breath sounds normal.  Abdominal: Soft. Bowel sounds are normal. He exhibits no distension. There is no tenderness.  Musculoskeletal:       Feet:  Lymphadenopathy:    He has no cervical adenopathy.    Lab Results Results for orders placed or performed during the hospital encounter of 11/17/18 (from the past 48 hour(s))  Comprehensive metabolic panel     Status: Abnormal   Collection Time: 11/17/18  1:46 PM  Result Value Ref Range   Sodium 136 135 - 145 mmol/L   Potassium 3.5 3.5 - 5.1 mmol/L   Chloride 92 (L) 98 - 111 mmol/L   CO2  27 22 - 32 mmol/L   Glucose, Bld 139 (H) 70 - 99 mg/dL   BUN 23 8 - 23 mg/dL   Creatinine, Ser 8.28 (H) 0.61 -  1.24 mg/dL   Calcium 8.5 (L) 8.9 - 10.3 mg/dL   Total Protein 8.4 (H) 6.5 - 8.1 g/dL   Albumin 2.8 (L) 3.5 - 5.0 g/dL   AST 49 (H) 15 - 41 U/L   ALT 37 0 - 44 U/L   Alkaline Phosphatase 54 38 - 126 U/L   Total Bilirubin 0.9 0.3 - 1.2 mg/dL   GFR calc non Af Amer 6 (L) >60 mL/min   GFR calc Af Amer 7 (L) >60 mL/min    Comment: (NOTE) The eGFR has been calculated using the CKD EPI equation. This calculation has not been validated in all clinical situations. eGFR's persistently <60 mL/min signify possible Chronic Kidney Disease.    Anion gap 17 (H) 5 - 15    Comment: Performed at [facility], [place] [address] [place], [place] [address]  CBC WITH DIFFERENTIAL     Status: Abnormal   Collection Time: 11/17/18  1:46 PM  Result Value Ref Range   WBC 9.4 4.0 - 10.5 K/uL   RBC 4.00 (L) 4.22 - 5.81 MIL/uL   Hemoglobin 11.3 (L) 13.0 - 17.0 g/dL   HCT 36.3 (L) 39.0 - 52.0 %   MCV 90.8 80.0 - 100.0 fL   MCH 28.3 26.0 - 34.0 pg   MCHC 31.1 30.0 - 36.0 g/dL   RDW 16.2 (H) 11.5 - 15.5 %   Platelets 278 150 - 400 K/uL   nRBC 0.0 0.0 - 0.2 %   Neutrophils Relative % 68 %   Neutro Abs 6.4 1.7 - 7.7 K/uL   Lymphocytes Relative 16 %   Lymphs Abs 1.5 0.7 - 4.0 K/uL   Monocytes Relative 12 %   Monocytes Absolute 1.2 (H) 0.1 - 1.0 K/uL   Eosinophils Relative 3 %   Eosinophils Absolute 0.3 0.0 - 0.5 K/uL   Basophils Relative 0 %   Basophils Absolute 0.0 0.0 - 0.1 K/uL   Immature Granulocytes 1 %   Abs Immature Granulocytes 0.05 0.00 - 0.07 K/uL    Comment: Performed at [facility], [place] [address] [place], [place] [address]  I-Stat CG4 Lactic Acid, ED     Status: Abnormal   Collection Time: 11/17/18  2:06 PM  Result Value Ref Range   Lactic Acid, Venous 2.01 (HH) 0.5 - 1.9 mmol/L   Comment NOTIFIED PHYSICIAN   Vancomycin, random     Status: None   Collection Time:  11/17/18  3:37 PM  Result Value Ref Range   Vancomycin Rm 19     Comment:        Random Vancomycin therapeutic range is dependent on dosage and time of specimen collection. A peak range is 20.0-40.0 ug/mL A trough range is 5.0-15.0 ug/mL        Performed at [facility] [address], [place],  [address]   I-Stat CG4 Lactic Acid, ED     Status: None   Collection Time: 11/17/18  3:55 PM  Result Value Ref Range   Lactic Acid, Venous 1.35 0.5 - 1.9 mmol/L  Glucose, capillary     Status: Abnormal   Collection Time: 11/17/18  6:30 PM  Result Value Ref Range   Glucose-Capillary 218 (H) 70 - 99 mg/dL  Glucose, capillary     Status: Abnormal   Collection Time: 11/17/18 10:17 PM  Result Value Ref Range   Glucose-Capillary 211 (H) 70 - 99 mg/dL  Basic metabolic panel     Status: Abnormal  Collection Time: 11/18/18  6:22 AM  Result Value Ref Range   Sodium 134 (L) 135 - 145 mmol/L   Potassium 3.9 3.5 - 5.1 mmol/L   Chloride 93 (L) 98 - 111 mmol/L   CO2 30 22 - 32 mmol/L   Glucose, Bld 164 (H) 70 - 99 mg/dL   BUN 30 (H) 8 - 23 mg/dL   Creatinine, Ser 9.95 (H) 0.61 - 1.24 mg/dL   Calcium 7.7 (L) 8.9 - 10.3 mg/dL   GFR calc non Af Amer 5 (L) >60 mL/min   GFR calc Af Amer 5 (L) >60 mL/min    Comment: (NOTE) The eGFR has been calculated using the CKD EPI equation. This calculation has not been validated in all clinical situations. eGFR's persistently <60 mL/min signify possible Chronic Kidney Disease.    Anion gap 11 5 - 15    Comment: Performed at [facility] [address] [place], [place] [address]  CBC     Status: Abnormal   Collection Time: 11/18/18  6:22 AM  Result Value Ref Range   WBC 6.2 4.0 - 10.5 K/uL   RBC 3.41 (L) 4.22 - 5.81 MIL/uL   Hemoglobin 9.6 (L) 13.0 - 17.0 g/dL   HCT 30.7 (L) 39.0 - 52.0 %   MCV 90.0 80.0 - 100.0 fL   MCH 28.2 26.0 - 34.0 pg   MCHC 31.3 30.0 - 36.0 g/dL   RDW 16.2 (H) 11.5 - 15.5 %   Platelets 260 150 - 400 K/uL    nRBC 0.0 0.0 - 0.2 %    Comment: Performed at [facility], [place] [address], [place], [place] [address]  APTT     Status: Abnormal   Collection Time: 11/18/18  6:22 AM  Result Value Ref Range   aPTT 44 (H) 24 - 36 seconds    Comment:        IF BASELINE aPTT IS ELEVATED, SUGGEST PATIENT RISK ASSESSMENT BE USED TO DETERMINE APPROPRIATE ANTICOAGULANT THERAPY. Performed at [facility], [place] [address] [place], [place] [address]   Glucose, capillary     Status: Abnormal   Collection Time: 11/18/18  8:07 AM  Result Value Ref Range   Glucose-Capillary 156 (H) 70 - 99 mg/dL      Component Value Date/Time   SDES  11/09/2018 1200    FOOT RIGHT PLANTAR Performed at [facility], [place] [address], [place], [place] [address]    SPECREQUEST  11/09/2018 1200    NONE Performed at [facility], [place] [address] [place], [place] [address]    CULT  11/09/2018 1200    ABUNDANT METHICILLIN RESISTANT STAPHYLOCOCCUS AUREUS   REPTSTATUS 11/12/2018 FINAL 11/09/2018 1200   Dg Chest Port 1 View  Result Date: 11/17/2018 CLINICAL DATA:  66 year old male with shortness of breath, weakness and sepsis EXAM: PORTABLE CHEST 1 VIEW COMPARISON:  Prior chest x-ray 05/10/2018 FINDINGS: The lungs are clear and negative for focal airspace consolidation, pulmonary edema or suspicious pulmonary nodule. No pleural effusion or pneumothorax. Cardiac and mediastinal contours are within normal limits. Atherosclerotic plaque present in the transverse aorta. No acute fracture or lytic or blastic osseous lesions. The visualized upper abdominal bowel gas pattern is unremarkable. IMPRESSION: No acute cardiopulmonary process. Aortic Atherosclerosis (ICD10-170.0) Electronically Signed   By: Jacqulynn Cadet M.D.   On: 11/17/2018 14:08   Recent Results (from the past 240 hour(s))  Aerobic Culture (superficial specimen)     Status: None  Collection Time: 11/09/18 12:00 PM  Result  Value Ref Range Status   Specimen Description   Final    FOOT RIGHT PLANTAR Performed at [facility] [address] [place], [place] [address]    Special Requests   Final    NONE Performed at [facility], [place] [address] [place], [place] [address]    Gram Stain   Final    MODERATE WBC PRESENT, PREDOMINANTLY PMN FEW GRAM POSITIVE COCCI Performed at [facility], [place] [address] [place], [place] [address]    Culture   Final    ABUNDANT METHICILLIN RESISTANT STAPHYLOCOCCUS AUREUS   Report Status 11/12/2018 FINAL  Final   Organism ID, Bacteria METHICILLIN RESISTANT STAPHYLOCOCCUS AUREUS  Final      Susceptibility   Methicillin resistant staphylococcus aureus - MIC*    CIPROFLOXACIN >=8 RESISTANT Resistant     ERYTHROMYCIN >=8 RESISTANT Resistant     GENTAMICIN <=0.5 SENSITIVE Sensitive     OXACILLIN >=4 RESISTANT Resistant     TETRACYCLINE >=16 RESISTANT Resistant     VANCOMYCIN 1 SENSITIVE Sensitive     TRIMETH/SULFA >=320 RESISTANT Resistant     CLINDAMYCIN >=8 RESISTANT Resistant     RIFAMPIN <=0.5 SENSITIVE Sensitive     Inducible Clindamycin NEGATIVE Sensitive     * ABUNDANT METHICILLIN RESISTANT STAPHYLOCOCCUS AUREUS    Microbiology: Recent Results (from the past 240 hour(s))  Aerobic Culture (superficial specimen)     Status: None   Collection Time: 11/09/18 12:00 PM  Result Value Ref Range Status   Specimen Description   Final    FOOT RIGHT PLANTAR Performed at [facility] [address] [place], [place] [address]    Special Requests   Final    NONE Performed at [facility], [place] [address], [place], [place] [address]    Gram Stain   Final    MODERATE WBC PRESENT, PREDOMINANTLY PMN FEW GRAM POSITIVE COCCI Performed at [facility], [place] [address], [place], [place] [address]    Culture   Final    ABUNDANT METHICILLIN RESISTANT STAPHYLOCOCCUS AUREUS   Report Status  11/12/2018 FINAL  Final   Organism ID, Bacteria METHICILLIN RESISTANT STAPHYLOCOCCUS AUREUS  Final      Susceptibility   Methicillin resistant staphylococcus aureus - MIC*    CIPROFLOXACIN >=8 RESISTANT Resistant     ERYTHROMYCIN >=8 RESISTANT Resistant     GENTAMICIN <=0.5 SENSITIVE Sensitive     OXACILLIN >=4 RESISTANT Resistant     TETRACYCLINE >=16 RESISTANT Resistant     VANCOMYCIN 1 SENSITIVE Sensitive     TRIMETH/SULFA >=320 RESISTANT Resistant     CLINDAMYCIN >=8 RESISTANT Resistant     RIFAMPIN <=0.5 SENSITIVE Sensitive     Inducible Clindamycin NEGATIVE Sensitive     * ABUNDANT METHICILLIN RESISTANT STAPHYLOCOCCUS AUREUS    Radiographs and labs were personally reviewed by me.   Bobby Rumpf, MD [facility] for Infectious Disease [facility] [phone] 11/18/2018, 9:53 AM        [facility] Staphylococcus aureus bacteremia   Staphylococcus aureus bacteremia (SAB) is associated with a high rate of complications and mortality.  Specific aspects of clinical management are critical to optimizing the outcome of patients with SAB.  Therefore, the [facility] Antimicrobial Management Team [facility]) has initiated an intervention aimed at improving the management of SAB at [facility].  To do so, Infectious Diseases physicians are providing an  evidence-based consult for the management of all patients with SAB.     Yes No Comments  Perform follow-up blood cultures (even if the patient is afebrile) to ensure clearance of bacteremia '[x]'  '[]'    Remove vascular catheter and obtain follow-up blood cultures after the removal of the catheter '[]'  '[]'    Perform echocardiography to evaluate for endocarditis (transthoracic ECHO is 40-50% sensitive, TEE is > 90% sensitive) '[x]'  '[]'  Please keep in mind, that neither test can definitively EXCLUDE endocarditis, and that should clinical suspicion remain high for endocarditis the patient should then  still be treated with an "endocarditis" duration of therapy = 6 weeks  Consult electrophysiologist to evaluate implanted cardiac device (pacemaker, ICD) '[]'  '[]'    Ensure source control '[]'  '[]'  Have all abscesses been drained effectively? Have deep seeded infections (septic joints or osteomyelitis) had appropriate surgical debridement?  Investigate for "metastatic" sites of infection '[x]'  '[]'  Does the patient have ANY symptom or physical exam finding that would suggest a deeper infection (back or neck pain that may be suggestive of vertebral osteomyelitis or epidural abscess, muscle pain that could be a symptom of pyomyositis)?  Keep in mind that for deep seeded infections MRI imaging with contrast is preferred rather than other often insensitive tests such as plain x-rays, especially early in a patient's presentation.  Change antibiotic therapy to __________________ '[]'  '[]'  Beta-lactam antibiotics are preferred for MSSA due to higher cure rates.   If on Vancomycin, goal trough should be 15 - 20 mcg/mL  Estimated duration of IV antibiotic therapy:   '[]'  '[]'  Consult case management for probably prolonged outpatient IV antibiotic therapy

## 2018-11-18 NOTE — Progress Notes (Signed)
Pharmacy Antibiotic Note  [name]. is a 67 y.o. male admitted on 11/17/2018 with sepsis secondary to cellulitis.  Pharmacy has been consulted for cefepime and vancomycin dosing. Patient noted to having penicillin allergy, but has tolerated cephalosporins in the past. Patient was started on antibiotics Tuesday this week for foot infection.  On 11/17/18 admit date, patient received ~30 minutes of dialysis with vancomycin 1 gm infusion PTA. Upon arrival to ED, vancomycin infusion was already paused with approximately half the bag remaining. Vancomycin random level this evening was 19 mcg/mL.   Currently, afebrile (Tmax 100.6), WBC 9.4>6.2k Of note, patient has history of ESRD receiving dialysis qMWF.   Patient is in HD now , x3 hr , BFR 400    Plan: Give Vancomycin 1g today after HD today 11/23. F/u HD plans for vancomycin scheduling for future doses.  Cefepime 1 gm IV q24h for now. F/u regular HD schedule.  F/u cultures and LOT   Height: 6\' 1"  (185.4 cm) Weight: 213 lb 10 oz (96.9 kg) IBW/kg (Calculated) : 79.9  Temp (24hrs), Avg:99.4 F (37.4 C), Min:98.3 F (36.8 C), Max:99.9 F (37.7 C)  Recent Labs  Lab 11/17/18 1346 11/17/18 1406 11/17/18 1537 11/17/18 1555 11/18/18 0622  WBC 9.4  --   --   --  6.2  CREATININE 8.28*  --   --   --  9.95*  LATICACIDVEN  --  2.01*  --  1.35  --   VANCORANDOM  --   --  19  --   --     Estimated Creatinine Clearance: 8.8 mL/min (A) (by C-G formula based on SCr of 9.95 mg/dL (H)).    Allergies  Allergen Reactions  . Penicillins Hives, Swelling and Other (See Comments)    Ankle swelling Has patient had a PCN reaction causing immediate rash, facial/tongue/throat swelling, SOB or lightheadedness with hypotension: Yes Has patient had a PCN reaction causing severe rash involving mucus membranes or skin necrosis: No Has patient had a PCN reaction that required hospitalization: No Has patient had a PCN reaction occurring within the last  10 years: No If all of the above answers are "NO", then may proceed with Cephalosporin use.    Antimicrobials this admission: Cefepime 11/22>>  Microbiology results: 11/22 BCx: ngtd   Thank you for allowing pharmacy to be a part of this patient's care.  Nicole Cella, RPh Clinical Pharmacist Please check AMION for all [place] phone numbers After 10:00 PM, call [place] [phone] 11/18/2018  3:09 PM

## 2018-11-18 NOTE — Progress Notes (Signed)
At 1733 gave tylenol 650mg , because patient had a temp of 101.6. Rechecked patient's temp 1945, and temp was 101.9. MD made aware, and wanted me to give another tylenol 350mg  once. Will recheck in 30 to 45 minutes.  Farley Ly RN

## 2018-11-18 NOTE — Progress Notes (Signed)
Patient well-known to Dr. [name], who has debrided his feet and amputated toes in the past for osteomyelitis.  Patient now hospitalized for infection flare with systemic sx, thought related to LE chronic infected wounds.   Has been reluctant in past to consider further surgical tx, since the best surgical option at the time was considered to be BKA. B foot MRI pending. I have spoken with Dr. [name] and he will see patient tomorrow and determine whether to proceed surgically on Monday.  [facility]

## 2018-11-19 ENCOUNTER — Inpatient Hospital Stay (HOSPITAL_COMMUNITY): Payer: Medicare Other

## 2018-11-19 DIAGNOSIS — B958 Unspecified staphylococcus as the cause of diseases classified elsewhere: Secondary | ICD-10-CM

## 2018-11-19 LAB — CBC
HEMATOCRIT: 32.5 % — AB (ref 39.0–52.0)
Hemoglobin: 10.2 g/dL — ABNORMAL LOW (ref 13.0–17.0)
MCH: 28.3 pg (ref 26.0–34.0)
MCHC: 31.4 g/dL (ref 30.0–36.0)
MCV: 90.3 fL (ref 80.0–100.0)
Platelets: 245 10*3/uL (ref 150–400)
RBC: 3.6 MIL/uL — ABNORMAL LOW (ref 4.22–5.81)
RDW: 16.2 % — AB (ref 11.5–15.5)
WBC: 7.7 10*3/uL (ref 4.0–10.5)
nRBC: 0 % (ref 0.0–0.2)

## 2018-11-19 LAB — RENAL FUNCTION PANEL
Albumin: 2.2 g/dL — ABNORMAL LOW (ref 3.5–5.0)
Anion gap: 9 (ref 5–15)
BUN: 23 mg/dL (ref 8–23)
CHLORIDE: 98 mmol/L (ref 98–111)
CO2: 29 mmol/L (ref 22–32)
CREATININE: 7.42 mg/dL — AB (ref 0.61–1.24)
Calcium: 8 mg/dL — ABNORMAL LOW (ref 8.9–10.3)
GFR, EST AFRICAN AMERICAN: 8 mL/min — AB (ref >60)
GFR, EST NON AFRICAN AMERICAN: 7 mL/min — AB (ref >60)
Glucose, Bld: 189 mg/dL — ABNORMAL HIGH (ref 70–99)
PHOSPHORUS: 3.1 mg/dL (ref 2.5–4.6)
Potassium: 4 mmol/L (ref 3.5–5.1)
Sodium: 136 mmol/L (ref 135–145)

## 2018-11-19 LAB — GLUCOSE, CAPILLARY
GLUCOSE-CAPILLARY: 107 mg/dL — AB (ref 70–99)
GLUCOSE-CAPILLARY: 176 mg/dL — AB (ref 70–99)

## 2018-11-19 MED ORDER — PENTAFLUOROPROP-TETRAFLUOROETH EX AERO: 1.0000 "application " | INHALATION_SPRAY | CUTANEOUS | Status: DC | PRN

## 2018-11-19 MED ORDER — SODIUM CHLORIDE 0.9 % IV SOLN
100.0000 mL | INTRAVENOUS | Status: DC | PRN
  Administered 2018-11-20: 15:00:00 via INTRAVENOUS

## 2018-11-19 MED ORDER — SODIUM CHLORIDE 0.9 % IV SOLN: 100.0000 mL | INTRAVENOUS | Status: DC | PRN

## 2018-11-19 MED ORDER — LIDOCAINE HCL (PF) 1 % IJ SOLN: 5.0000 mL | INTRAMUSCULAR | Status: DC | PRN

## 2018-11-19 MED ORDER — MIDODRINE HCL 5 MG PO TABS
ORAL_TABLET | ORAL | Status: AC
  Filled 2018-11-19: qty 2

## 2018-11-19 MED ORDER — ACETAMINOPHEN 325 MG PO TABS
ORAL_TABLET | ORAL | Status: AC
  Filled 2018-11-19: qty 2

## 2018-11-19 MED ORDER — DOXERCALCIFEROL 4 MCG/2ML IV SOLN
INTRAVENOUS | Status: AC
  Administered 2018-11-19: 3 ug via INTRAVENOUS
  Filled 2018-11-19: qty 2

## 2018-11-19 MED ORDER — LIDOCAINE-PRILOCAINE 2.5-2.5 % EX CREA: 1.0000 "application " | TOPICAL_CREAM | CUTANEOUS | Status: DC | PRN

## 2018-11-19 MED ORDER — VANCOMYCIN HCL IN DEXTROSE 1-5 GM/200ML-% IV SOLN
1000.0000 mg | Freq: Once | INTRAVENOUS | Status: AC
  Administered 2018-11-19: 1000 mg via INTRAVENOUS
  Filled 2018-11-19: qty 200

## 2018-11-19 MED ORDER — HEPARIN SODIUM (PORCINE) 1000 UNIT/ML DIALYSIS: 1000.0000 [IU] | INTRAMUSCULAR | Status: DC | PRN

## 2018-11-19 NOTE — Progress Notes (Signed)
INFECTIOUS DISEASE PROGRESS NOTE  ID: [name] Sages Sr. is a 67 y.o. male with  Principal Problem:   Sepsis ([place]) Active Problems:   HTN (hypertension)   Anemia due to chronic blood loss   Osteomyelitis of left foot ([place])   Type 2 diabetes mellitus with left diabetic foot ulcer ([place])   ESRD on hemodialysis ([place])   Diabetic ulcer of foot associated with diabetes mellitus due to underlying condition, with necrosis of muscle ([facility])  Subjective: Resting quietly in HD  Abtx:  Anti-infectives (From admission, onward)   Start     Dose/Rate Route Frequency Ordered Stop   11/18/18 2000  ceFEPIme (MAXIPIME) 1 g in sodium chloride 0.9 % 100 mL IVPB     1 g 200 mL/hr over 30 Minutes Intravenous Every 24 hours 11/17/18 1808     11/18/18 1530  vancomycin (VANCOCIN) IVPB 1000 mg/200 mL premix     1,000 mg 200 mL/hr over 60 Minutes Intravenous  Once 11/18/18 1459 11/18/18 1816   11/17/18 2200  ceFEPIme (MAXIPIME) 2 g in sodium chloride 0.9 % 100 mL IVPB  Status:  Discontinued     2 g 200 mL/hr over 30 Minutes Intravenous Every 8 hours 11/17/18 1827 11/17/18 1829   11/17/18 1826  vancomycin variable dose per unstable renal function (pharmacist dosing)      Does not apply See admin instructions 11/17/18 1826     11/17/18 1400  ceFEPIme (MAXIPIME) 2 g in sodium chloride 0.9 % 100 mL IVPB     2 g 200 mL/hr over 30 Minutes Intravenous  Once 11/17/18 1353 11/17/18 1510      Medications:  Scheduled: . atorvastatin  40 mg Oral QHS  . clopidogrel  75 mg Oral Daily  . [START ON 11/20/2018] doxercalciferol  3 mcg Intravenous Q M,W,F-HD  . ferric citrate  630 mg Oral TID WC  . heparin  5,000 Units Subcutaneous Q8H  . insulin aspart  0-9 Units Subcutaneous TID WC  . insulin aspart protamine- aspart  5 Units Subcutaneous BID WC  . midodrine  10 mg Oral BID WC  . multivitamin with minerals  1 tablet Oral Q supper  . primidone  50 mg Oral QHS  . timolol  1 drop Left Eye BID  . vancomycin  variable dose per unstable renal function (pharmacist dosing)   Does not apply See admin instructions    Objective: Vital signs in last 24 hours: Temp:  [98.3 F (36.8 C)-101.9 F (38.8 C)] 99 F (37.2 C) (11/24 0655) Pulse Rate:  [70-102] 73 (11/24 1000) Resp:  [18-19] 18 (11/24 0655) BP: (135-180)/(64-98) 161/79 (11/24 1000) SpO2:  [91 %-98 %] 94 % (11/24 0655) Weight:  [94.9 kg-96.9 kg] 95.2 kg (11/24 0655)   General appearance: no distress Resp: clear to auscultation bilaterally Cardio: regular rate and rhythm GI: normal findings: bowel sounds normal and soft, non-tender Extremities: feet are dressed (socks), odor present.   Lab Results Recent Labs    11/18/18 0622 11/19/18 0556  WBC 6.2 7.7  HGB 9.6* 10.2*  HCT 30.7* 32.5*  NA 134* 136  K 3.9 4.0  CL 93* 98  CO2 30 29  BUN 30* 23  CREATININE 9.95* 7.42*   Liver Panel Recent Labs    11/17/18 1346 11/19/18 0556  PROT 8.4*  --   ALBUMIN 2.8* 2.2*  AST 49*  --   ALT 37  --   ALKPHOS 54  --   BILITOT 0.9  --  Sedimentation Rate No results for input(s): ESRSEDRATE in the last 72 hours. C-Reactive Protein No results for input(s): CRP in the last 72 hours.  Microbiology: Recent Results (from the past 240 hour(s))  Aerobic Culture (superficial specimen)     Status: None   Collection Time: 11/09/18 12:00 PM  Result Value Ref Range Status   Specimen Description   Final    FOOT RIGHT PLANTAR Performed at [facility] [address] [place], [place] [address]    Special Requests   Final    NONE Performed at [facility], [place] [address], [place], [place] [address]    Gram Stain   Final    MODERATE WBC PRESENT, PREDOMINANTLY PMN FEW GRAM POSITIVE COCCI Performed at [facility], [place] [address], [place], [place] [address]    Culture   Final    ABUNDANT METHICILLIN RESISTANT STAPHYLOCOCCUS AUREUS   Report Status 11/12/2018 FINAL  Final   Organism ID,  Bacteria METHICILLIN RESISTANT STAPHYLOCOCCUS AUREUS  Final      Susceptibility   Methicillin resistant staphylococcus aureus - MIC*    CIPROFLOXACIN >=8 RESISTANT Resistant     ERYTHROMYCIN >=8 RESISTANT Resistant     GENTAMICIN <=0.5 SENSITIVE Sensitive     OXACILLIN >=4 RESISTANT Resistant     TETRACYCLINE >=16 RESISTANT Resistant     VANCOMYCIN 1 SENSITIVE Sensitive     TRIMETH/SULFA >=320 RESISTANT Resistant     CLINDAMYCIN >=8 RESISTANT Resistant     RIFAMPIN <=0.5 SENSITIVE Sensitive     Inducible Clindamycin NEGATIVE Sensitive     * ABUNDANT METHICILLIN RESISTANT STAPHYLOCOCCUS AUREUS  Blood Culture (routine x 2)     Status: None (Preliminary result)   Collection Time: 11/17/18  1:46 PM  Result Value Ref Range Status   Specimen Description BLOOD RIGHT ANTECUBITAL  Final   Special Requests   Final    BOTTLES DRAWN AEROBIC AND ANAEROBIC Blood Culture adequate volume   Culture   Final    NO GROWTH 1 DAY Performed at [facility], [address] [place], [place] [address]    Report Status PENDING  Incomplete  Blood Culture (routine x 2)     Status: None (Preliminary result)   Collection Time: 11/17/18  1:48 PM  Result Value Ref Range Status   Specimen Description BLOOD RIGHT HAND  Final   Special Requests   Final    BOTTLES DRAWN AEROBIC AND ANAEROBIC Blood Culture adequate volume   Culture   Final    NO GROWTH 1 DAY Performed at [facility], [place] [address], [place], [place] [address]    Report Status PENDING  Incomplete    Studies/Results: Dg Chest Port 1 View  Result Date: 11/17/2018 CLINICAL DATA:  67 year old male with shortness of breath, weakness and sepsis EXAM: PORTABLE CHEST 1 VIEW COMPARISON:  Prior chest x-ray 05/10/2018 FINDINGS: The lungs are clear and negative for focal airspace consolidation, pulmonary edema or suspicious pulmonary nodule. No pleural effusion or pneumothorax. Cardiac and mediastinal contours are within normal limits.  Atherosclerotic plaque present in the transverse aorta. No acute fracture or lytic or blastic osseous lesions. The visualized upper abdominal bowel gas pattern is unremarkable. IMPRESSION: No acute cardiopulmonary process. Aortic Atherosclerosis (ICD10-170.0) Electronically Signed   By: Jacqulynn Cadet M.D.   On: 11/17/2018 14:08     Assessment/Plan: Staph bacteremia Osteomyelitis of L foot ESRD DM2  Total days of antibiotics: 2 vanco  Temps appear better today He needs  MRI of his feet and most likely amputation of his L foot (if he will agree).  Continue vanco Await repeat BCx 11-22 (ngtd) Will need TEE         Bobby Rumpf MD, FACP Infectious Diseases (pager) [phone] [email or web address] 11/19/2018, 10:12 AM  LOS: 2 days

## 2018-11-19 NOTE — Progress Notes (Signed)
Pharmacy Antibiotic Note  [name]. is a 67 y.o. male admitted on 11/17/2018 with sepsis secondary to MRSA bacteremia (from outside HD cultures) and osteomyelitis. Patient has ESRD on HD MWF. Pharmacy has been consulted for cefepime and vancomycin dosing.  Patient was started on antibiotics outpatient on 11/19 for osteomyelitis of L foot. On 11/17/18, patient received ~30 minutes of dialysis with vancomycin 1 gm infusion PTA. Upon arrival to ED, vancomycin infusion was already paused with approximately half the bag remaining.  Today, patient remains afebrile and WBC is within normal limits. Given outside HD cultures are positive for MRSA, will stop cefepime per ID. Follow-up blood cultures are in process. Patient tolerated full HD session today (holiday schedule), so will re-dose vancomycin.   Plan: Stop Cefepime Give Vancomycin 1g IV today x1. F/u HD plans for vancomycin scheduling for future doses.  F/u endocarditis workup, clinical progress, repeat cultures and LOT   Height: 6\' 1"  (185.4 cm) Weight: 204 lb 5.9 oz (92.7 kg) IBW/kg (Calculated) : 79.9  Temp (24hrs), Avg:99.9 F (37.7 C), Min:99 F (37.2 C), Max:101.9 F (38.8 C)  Recent Labs  Lab 11/17/18 1346 11/17/18 1406 11/17/18 1537 11/17/18 1555 11/18/18 0622 11/19/18 0556  WBC 9.4  --   --   --  6.2 7.7  CREATININE 8.28*  --   --   --  9.95* 7.42*  LATICACIDVEN  --  2.01*  --  1.35  --   --   VANCORANDOM  --   --  19  --   --   --     Estimated Creatinine Clearance: 10.9 mL/min (A) (by C-G formula based on SCr of 7.42 mg/dL (H)).    Allergies  Allergen Reactions  . Penicillins Hives, Swelling and Other (See Comments)    Ankle swelling Has patient had a PCN reaction causing immediate rash, facial/tongue/throat swelling, SOB or lightheadedness with hypotension: Yes Has patient had a PCN reaction causing severe rash involving mucus membranes or skin necrosis: No Has patient had a PCN reaction that required  hospitalization: No Has patient had a PCN reaction occurring within the last 10 years: No If all of the above answers are "NO", then may proceed with Cephalosporin use.    Antimicrobials this admission: Cefepime 11/22>>11/24 Vancomycin 11/19>>  Microbiology results: 11/19 Outside HD BCx: MRSA 11/22 BCx: ngtd   Thank you for allowing pharmacy to be a part of this patient's care.  Jackson Latino, PharmD PGY1 Pharmacy Resident Phone [phone] 11/19/2018     2:48 PM

## 2018-11-19 NOTE — Progress Notes (Addendum)
[facility] Progress Note   Subjective:  Seen in HD unit. UF goal increased 2.5L No new complaints.   Objective Vitals:   11/19/18 0830 11/19/18 0900 11/19/18 0930 11/19/18 1000  BP: (!) 175/88 (!) 180/86 (!) 165/80 (!) 161/79  Pulse: 71 72 76 73  Resp:      Temp:      TempSrc:      SpO2:      Weight:      Height:       Physical Exam General: Chronically ill appearing NAD  Neck: Supple. No JVD  Lungs: CTA bilaterally without wheezes, rales, or rhonchi.  Heart: RRR with S1 S2 Abdomen: soft NT + BS Lower extremities: bilat heel wounds bandaged L foot ischemic changes s/p toe amps. Trace LE edema  Dialysis Access:LUE AVF +bruit   Dialysis Orders:  [facility] MWF 4.25h BFR 500 EDW 95.5kg 2K/2.25Ca  Prof 2 AVF No Heparin -Hectorol 3 mcg IV TIW -Parsabiv 7.5 mg IV TIW   Assessment/Plan: 1. MRSA bacteremia - most likely 2/2 Chronic bilateral foot wounds Hx osteomyelitis of L foot. . Blood cultures drawn at outpatient kidney center on 11/19 +MRSA. Repeat cultures pending here. On vanc/cefepime. ID/Ortho following. For foot MRI today.  2. ESRD -  MWF. Unable to complete HD Friday d/t fevers. Short HD yesterday. Back on schedule for HD today. Holiday schedule this week.  3. Hypertension/volume  - BP elevated this am. On midodrine as outpatient. Monitor. No gross volume on exam. UF to EDW as tolerated. Challenging UF goal today.  4. Anemia  - hgb 10.2 Follow trends. Not currently on ESA  5. Metabolic bone disease -  Continue Hectorol/Auryxia binder. No Parsabiv here.  6. Nutrition - Renal diet/vitamins 7. NICM EF 55-60% in 02/2018  8. PAD - s/p L tibial angioplasty/stenting 04/2018.  Followed Dr. Scot Dock.  On Plavix 9. DM T2   Lynnda Child PA-C [facility] Pager [phone] 11/19/2018,10:15 AM  LOS: 2 days   Pt seen, examined and agree w A/P as above.  Kelly Splinter MD [facility] pager [phone]   11/19/2018, 2:02  PM    Additional Objective Labs: Basic Metabolic Panel: Recent Labs  Lab 11/17/18 1346 11/18/18 0622 11/19/18 0556  NA 136 134* 136  K 3.5 3.9 4.0  CL 92* 93* 98  CO2 27 30 29   GLUCOSE 139* 164* 189*  BUN 23 30* 23  CREATININE 8.28* 9.95* 7.42*  CALCIUM 8.5* 7.7* 8.0*  PHOS  --   --  3.1   CBC: Recent Labs  Lab 11/17/18 1346 11/18/18 0622 11/19/18 0556  WBC 9.4 6.2 7.7  NEUTROABS 6.4  --   --   HGB 11.3* 9.6* 10.2*  HCT 36.3* 30.7* 32.5*  MCV 90.8 90.0 90.3  PLT 278 260 245   Blood Culture    Component Value Date/Time   SDES BLOOD RIGHT HAND 11/17/2018 1348   SPECREQUEST  11/17/2018 1348    BOTTLES DRAWN AEROBIC AND ANAEROBIC Blood Culture adequate volume   CULT  11/17/2018 1348    NO GROWTH 1 DAY Performed at [facility], [place] [address] [place], [place] [address]    REPTSTATUS PENDING 11/17/2018 1348    Cardiac Enzymes: No results for input(s): CKTOTAL, CKMB, CKMBINDEX, TROPONINI in the last 168 hours. CBG: Recent Labs  Lab 11/17/18 2217 11/18/18 0807 11/18/18 1204 11/18/18 1722 11/18/18 2048  GLUCAP 211* 156* 156* 139* 118*   Iron Studies: No results for input(s): IRON, TIBC,  TRANSFERRIN, FERRITIN in the last 72 hours. Lab Results  Component Value Date   INR 1.15 05/10/2018   INR 1.19 05/17/2017   INR 1.33 11/27/2014   Medications: . sodium chloride    . sodium chloride    . ceFEPime (MAXIPIME) IV 1 g (11/18/18 2022)   . atorvastatin  40 mg Oral QHS  . clopidogrel  75 mg Oral Daily  . [START ON 11/20/2018] doxercalciferol  3 mcg Intravenous Q M,W,F-HD  . ferric citrate  630 mg Oral TID WC  . heparin  5,000 Units Subcutaneous Q8H  . insulin aspart  0-9 Units Subcutaneous TID WC  . insulin aspart protamine- aspart  5 Units Subcutaneous BID WC  . midodrine  10 mg Oral BID WC  . multivitamin with minerals  1 tablet Oral Q supper  . primidone  50 mg Oral QHS  . timolol  1 drop Left Eye BID  . vancomycin variable dose per unstable  renal function (pharmacist dosing)   Does not apply See admin instructions

## 2018-11-19 NOTE — Progress Notes (Signed)
PROGRESS NOTE                                                                                                                                                                                                             Patient Demographics:    [name], is a 67 y.o. male, DOB - 11-Sep-1951, KVQ:[id number]  Admit date - 11/17/2018   Admitting Physician [name], MD  Outpatient Primary MD for the patient is [name], MD  LOS - 2   No chief complaint on file.      Brief Narrative   67 y.o. male  with past medical history of diabetes end-stage renal disease on hemodialysis Monday Wednesday on Friday type 2 diabetes mellitus hypertension cardiomyopathy severe peripheral vascular disease multiple toe amputations bilaterally.  Bilateral foot ulcer, sent by hemodialysis centers for positive blood cultures for Smrsa with known chronic LEFT  osteomyelitis, chronic left lower extremity wound.    Subjective:    [name] today has, No headache, No chest pain, No abdominal pain , fever 101.9 yesterday evening  Assessment  & Plan :    Principal Problem:   Sepsis ([place]) Active Problems:   HTN (hypertension)   Anemia due to chronic blood loss   Osteomyelitis of left foot ([facility])   Type 2 diabetes mellitus with left diabetic foot ulcer ([place])   ESRD on hemodialysis ([place])   Diabetic ulcer of foot associated with diabetes mellitus due to underlying condition, with necrosis of muscle ([place])  MRSA bacteremia - He had blood culture positive for staph at hemodialysis center 11/14/2018, admitted for further work-up, following repeat blood cultures 11/17/2018. -Most likely in the setting of chronic left foot osteomyelitis, infected foot, MRI of bilateral feet remains pending. -[place] repeat blood cultures 11/17/2018, so far is negative -We will need TEE per the recommendation, and 2D echo currently pending cardiology response -Continue with IV  vancomycin per ID recommendation -Orthopedic has been consulted, Dr. [name] to evaluate today.   Left foot infection/chronic osteomyelitis -Please see above discussion  ESRD -Renal consulted , HD per renal  Hypertension -Does appear patient on Midodrin, blood pressure uncontrolled, will discontinue Midodrin for now  PAD  - s/p L tibial angioplasty/stenting 04/2018.  Followed Dr. Scot Dock.  On Plavix, I will hold starting today as he will need surgery  Diabetes mellitus type 2 -  Continue with insulin sliding scale, CBG overall acceptable, check A1c in a.m.  Anemia Of chronic kidney disease -Hemoglobin 9.6 ,continue to monitor closely       Code Status : Full  Family Communication  : none at bedside  Disposition Plan  : pending further work up  Consults  :  Renal/ID  Procedures  : none  DVT Prophylaxis  :  [place] heaprin  Lab Results  Component Value Date   PLT 245 11/19/2018    Antibiotics  :    Anti-infectives (From admission, onward)   Start     Dose/Rate Route Frequency Ordered Stop   11/18/18 2000  ceFEPIme (MAXIPIME) 1 g in sodium chloride 0.9 % 100 mL IVPB     1 g 200 mL/hr over 30 Minutes Intravenous Every 24 hours 11/17/18 1808     11/18/18 1530  vancomycin (VANCOCIN) IVPB 1000 mg/200 mL premix     1,000 mg 200 mL/hr over 60 Minutes Intravenous  Once 11/18/18 1459 11/18/18 1816   11/17/18 2200  ceFEPIme (MAXIPIME) 2 g in sodium chloride 0.9 % 100 mL IVPB  Status:  Discontinued     2 g 200 mL/hr over 30 Minutes Intravenous Every 8 hours 11/17/18 1827 11/17/18 1829   11/17/18 1826  vancomycin variable dose per unstable renal function (pharmacist dosing)      Does not apply See admin instructions 11/17/18 1826     11/17/18 1400  ceFEPIme (MAXIPIME) 2 g in sodium chloride 0.9 % 100 mL IVPB     2 g 200 mL/hr over 30 Minutes Intravenous  Once 11/17/18 1353 11/17/18 1510        Objective:   Vitals:   11/19/18 1000 11/19/18 1030 11/19/18 1100 11/19/18 1110    BP: (!) 161/79 (!) 169/79 (!) 159/87 (!) 154/75  Pulse: 73 72 72 75  Resp:    18  Temp:    99 F (37.2 C)  TempSrc:    Oral  SpO2:    97%  Weight:    92.7 kg  Height:        Wt Readings from Last 3 Encounters:  11/19/18 92.7 kg  11/08/18 99.3 kg  10/31/18 97 kg     Intake/Output Summary (Last 24 hours) at 11/19/2018 1155 Last data filed at 11/19/2018 1100 Gross per 24 hour  Intake 420 ml  Output 1500 ml  Net -1080 ml     Physical Exam  Awake Alert, Oriented X 3, No new F.N deficits, Normal affect Symmetrical Chest wall movement, Good air movement bilaterally, CTAB RRR,No Gallops,Rubs or new Murmurs, No Parasternal Heave +ve B.Sounds, Abd Soft, No tenderness, No rebound - guarding or rigidity. Left lower extremity with lateral wound, foul-smelling odor,    Data Review:    CBC Recent Labs  Lab 11/17/18 1346 11/18/18 0622 11/19/18 0556  WBC 9.4 6.2 7.7  HGB 11.3* 9.6* 10.2*  HCT 36.3* 30.7* 32.5*  PLT 278 260 245  MCV 90.8 90.0 90.3  MCH 28.3 28.2 28.3  MCHC 31.1 31.3 31.4  RDW 16.2* 16.2* 16.2*  LYMPHSABS 1.5  --   --   MONOABS 1.2*  --   --   EOSABS 0.3  --   --   BASOSABS 0.0  --   --     Chemistries  Recent Labs  Lab 11/17/18 1346 11/18/18 0622 11/19/18 0556  NA 136 134* 136  K 3.5 3.9 4.0  CL 92* 93* 98  CO2 27 30 29   GLUCOSE 139* 164*  189*  BUN 23 30* 23  CREATININE 8.28* 9.95* 7.42*  CALCIUM 8.5* 7.7* 8.0*  AST 49*  --   --   ALT 37  --   --   ALKPHOS 54  --   --   BILITOT 0.9  --   --    ------------------------------------------------------------------------------------------------------------------ No results for input(s): CHOL, HDL, LDLCALC, TRIG, CHOLHDL, LDLDIRECT in the last 72 hours.  Lab Results  Component Value Date   HGBA1C 6.7 (H) 05/16/2017   ------------------------------------------------------------------------------------------------------------------ No results for input(s): TSH, T4TOTAL, T3FREE, THYROIDAB  in the last 72 hours.  Invalid input(s): FREET3 ------------------------------------------------------------------------------------------------------------------ No results for input(s): VITAMINB12, FOLATE, FERRITIN, TIBC, IRON, RETICCTPCT in the last 72 hours.  Coagulation profile No results for input(s): INR, PROTIME in the last 168 hours.  No results for input(s): DDIMER in the last 72 hours.  Cardiac Enzymes No results for input(s): CKMB, TROPONINI, MYOGLOBIN in the last 168 hours.  Invalid input(s): CK ------------------------------------------------------------------------------------------------------------------ No results found for: BNP  Inpatient Medications  Scheduled Meds: . atorvastatin  40 mg Oral QHS  . clopidogrel  75 mg Oral Daily  . [START ON 11/20/2018] doxercalciferol  3 mcg Intravenous Q M,W,F-HD  . ferric citrate  630 mg Oral TID WC  . heparin  5,000 Units Subcutaneous Q8H  . insulin aspart  0-9 Units Subcutaneous TID WC  . insulin aspart protamine- aspart  5 Units Subcutaneous BID WC  . midodrine  10 mg Oral BID WC  . multivitamin with minerals  1 tablet Oral Q supper  . primidone  50 mg Oral QHS  . timolol  1 drop Left Eye BID  . vancomycin variable dose per unstable renal function (pharmacist dosing)   Does not apply See admin instructions   Continuous Infusions: . sodium chloride    . sodium chloride    . ceFEPime (MAXIPIME) IV 1 g (11/18/18 2022)   PRN Meds:.sodium chloride, sodium chloride, acetaminophen, heparin, lidocaine (PF), lidocaine-prilocaine, mupirocin ointment, pentafluoroprop-tetrafluoroeth  Micro Results Recent Results (from the past 240 hour(s))  Aerobic Culture (superficial specimen)     Status: None   Collection Time: 11/09/18 12:00 PM  Result Value Ref Range Status   Specimen Description   Final    FOOT RIGHT PLANTAR Performed at [facility] [address], [place], [place] [address]    Special  Requests   Final    NONE Performed at [facility], [place] [address] [place], [place] [address]    Gram Stain   Final    MODERATE WBC PRESENT, PREDOMINANTLY PMN FEW GRAM POSITIVE COCCI Performed at [facility], [place] [address], [place], [place] [address]    Culture   Final    ABUNDANT METHICILLIN RESISTANT STAPHYLOCOCCUS AUREUS   Report Status 11/12/2018 FINAL  Final   Organism ID, Bacteria METHICILLIN RESISTANT STAPHYLOCOCCUS AUREUS  Final      Susceptibility   Methicillin resistant staphylococcus aureus - MIC*    CIPROFLOXACIN >=8 RESISTANT Resistant     ERYTHROMYCIN >=8 RESISTANT Resistant     GENTAMICIN <=0.5 SENSITIVE Sensitive     OXACILLIN >=4 RESISTANT Resistant     TETRACYCLINE >=16 RESISTANT Resistant     VANCOMYCIN 1 SENSITIVE Sensitive     TRIMETH/SULFA >=320 RESISTANT Resistant     CLINDAMYCIN >=8 RESISTANT Resistant     RIFAMPIN <=0.5 SENSITIVE Sensitive     Inducible Clindamycin NEGATIVE Sensitive     * ABUNDANT METHICILLIN RESISTANT STAPHYLOCOCCUS AUREUS  Blood Culture (routine x 2)  Status: None (Preliminary result)   Collection Time: 11/17/18  1:46 PM  Result Value Ref Range Status   Specimen Description BLOOD RIGHT ANTECUBITAL  Final   Special Requests   Final    BOTTLES DRAWN AEROBIC AND ANAEROBIC Blood Culture adequate volume   Culture   Final    NO GROWTH 1 DAY Performed at [facility], [address], [place], [place] [address]    Report Status PENDING  Incomplete  Blood Culture (routine x 2)     Status: None (Preliminary result)   Collection Time: 11/17/18  1:48 PM  Result Value Ref Range Status   Specimen Description BLOOD RIGHT HAND  Final   Special Requests   Final    BOTTLES DRAWN AEROBIC AND ANAEROBIC Blood Culture adequate volume   Culture   Final    NO GROWTH 1 DAY Performed at [facility], [place] [address], [place],  [address]    Report Status PENDING  Incomplete    Radiology Reports Dg  Chest Port 1 View  Result Date: 11/17/2018 CLINICAL DATA:  67 year old male with shortness of breath, weakness and sepsis EXAM: PORTABLE CHEST 1 VIEW COMPARISON:  Prior chest x-ray 05/10/2018 FINDINGS: The lungs are clear and negative for focal airspace consolidation, pulmonary edema or suspicious pulmonary nodule. No pleural effusion or pneumothorax. Cardiac and mediastinal contours are within normal limits. Atherosclerotic plaque present in the transverse aorta. No acute fracture or lytic or blastic osseous lesions. The visualized upper abdominal bowel gas pattern is unremarkable. IMPRESSION: No acute cardiopulmonary process. Aortic Atherosclerosis (ICD10-170.0) Electronically Signed   By: Jacqulynn Cadet M.D.   On: 11/17/2018 14:08   Vas [place] Burnard Bunting With/wo Tbi  Result Date: 11/08/2018 LOWER EXTREMITY DOPPLER STUDY Indications: Peripheral artery disease. High Risk Factors: Hypertension, insulin dependent diabetes mellitus, Diabetes,                    past history of smoking.  Vascular               Left AVF Interventions:         Angioplasty and stent of left tibioperoneal trunk                        05/11/2018. Limitations: Patient refused bandage wrappings of the right lower extremity to              be removed. Unable to perform right ABIs. Performing Technologist: Ronal Fear RVS, RCS  Examination Guidelines: A complete evaluation includes at minimum, Doppler waveform signals and systolic blood pressure reading at the level of bilateral brachial, anterior tibial, and posterior tibial arteries, when vessel segments are accessible. Bilateral testing is considered an integral part of a complete examination. Photoelectric Plethysmograph (PPG) waveforms and toe systolic pressure readings are included as required and additional duplex testing as needed. Limited examinations for reoccurring indications may be performed as noted.  ABI Findings: +--------+------------------+-----+--------+--------+ Right    Rt Pressure (mmHg)IndexWaveformComment  +--------+------------------+-----+--------+--------+ [id number]                                     +--------+------------------+-----+--------+--------+ +----+------------------+-----+--------+-------+ LeftLt Pressure (mmHg)IndexWaveformComment +----+------------------+-----+--------+-------+ PTA 173               1.10 biphasic        +----+------------------+-----+--------+-------+ DP  138  0.88 biphasic        +----+------------------+-----+--------+-------+ +-------+-----------+-----------+------------+------------+ ABI/TBIToday's ABIToday's TBIPrevious ABIPrevious TBI +-------+-----------+-----------+------------+------------+ Right                        1.43        0.41         +-------+-----------+-----------+------------+------------+ Left   1.10       0.88       1.21        0.32         +-------+-----------+-----------+------------+------------+ Arterial wall calcification precludes accurate ankle pressures and ABIs. Circumferential wall calcification observed on 2D imaging.  Summary: Left: Although ankle brachial indices are within normal limits (0.95-1.29), arterial Doppler waveforms at the ankle suggest some component of arterial occlusive disease.  *See table(s) above for measurements and observations.  Electronically signed by Deitra Mayo MD on 11/08/2018 at 10:00:28 AM.   Final    Vas [place] Lower Extremity Arterial Duplex  Result Date: 11/08/2018 LOWER EXTREMITY ARTERIAL DUPLEX STUDY Indications: Peripheral artery disease. High Risk Factors: Hypertension, insulin dependent diabetes mellitus, Diabetes.  Vascular               Left AVF Interventions:         Angioplasty and stent of left tibioperoneal trunk                        05/11/2018. Current ABI:           R=not performed, L=1.10 Performing Technologist: Ronal Fear RVS, RCS  Examination Guidelines: A complete evaluation includes  B-mode imaging, spectral Doppler, color Doppler, and power Doppler as needed of all accessible portions of each vessel. Bilateral testing is considered an integral part of a complete examination. Limited examinations for reoccurring indications may be performed as noted.  Left Duplex Findings: +-----------+--------+-----+--------+--------+--------+            PSV cm/sRatioStenosisWaveformComments +-----------+--------+-----+--------+--------+--------+ CFA Distal 130                  biphasic         +-----------+--------+-----+--------+--------+--------+ DFA        99                   biphasic         +-----------+--------+-----+--------+--------+--------+ SFA Prox   122                  biphasic         +-----------+--------+-----+--------+--------+--------+ SFA Mid    149                  biphasic         +-----------+--------+-----+--------+--------+--------+ SFA Distal 105                  biphasic         +-----------+--------+-----+--------+--------+--------+ POP Prox   129                  biphasic         +-----------+--------+-----+--------+--------+--------+ POP Distal 151                  biphasic         +-----------+--------+-----+--------+--------+--------+ ATA Prox   80                   biphasic         +-----------+--------+-----+--------+--------+--------+ ATA Mid    64  biphasic         +-----------+--------+-----+--------+--------+--------+ PTA Prox   166                  biphasic         +-----------+--------+-----+--------+--------+--------+ PTA Mid    156                  biphasic         +-----------+--------+-----+--------+--------+--------+ PTA Distal 144                  biphasic         +-----------+--------+-----+--------+--------+--------+ PERO Prox                       absent           +-----------+--------+-----+--------+--------+--------+ PERO Mid                         absent           +-----------+--------+-----+--------+--------+--------+ PERO Distal                     absent           +-----------+--------+-----+--------+--------+--------+ DP         58                   biphasic         +-----------+--------+-----+--------+--------+--------+  Summary: Left: Patent left lower extremity arterial system and left tibioperoneal artery stent without evidence of hemodynamically significant stenosis. Circumferential wall calcification is observed throughout the left lower extremity.  See table(s) above for measurements and observations. Electronically signed by Deitra Mayo MD on 11/08/2018 at 9:42:03 AM.    Final      Phillips Climes M.D on 11/19/2018 at 11:55 AM  Between 7am to 7pm - Pager - [phone]  After 7pm go to [email or web address] - password [email or web address]  [facility] -  Office  [phone]

## 2018-11-19 NOTE — Progress Notes (Signed)
Patient ID: [name] LAUR Sr., male   DOB: 16-Nov-1951, 67 y.o.   MRN: [id number]  The patient is well known to me and has been admitted with MRSA sepsis.  MRI shows extensive osteomyelitis of the left foot with involvement of the cuboid and cuneiform worse.  I do not believe there is limited surgery that can adequately address the osteomyelitis and given the fact that the patient has become septic from this problem I think the below-knee amputation is the most reasonable course of action.  I have reviewed the MRI and have spoken directly to the patient but have not had an opportunity to examine him.  He has been examined by my partner Dr. Grandville Silos.  We will plan on proceeding with left below-knee amputation tomorrow and I will discuss the procedure in more detail with the patient tomorrow.

## 2018-11-19 NOTE — Progress Notes (Signed)
Patient in dialysis.

## 2018-11-20 ENCOUNTER — Inpatient Hospital Stay (HOSPITAL_COMMUNITY): Payer: Medicare Other | Admitting: Anesthesiology

## 2018-11-20 ENCOUNTER — Encounter (HOSPITAL_COMMUNITY): Payer: Self-pay | Admitting: Certified Registered Nurse Anesthetist

## 2018-11-20 ENCOUNTER — Inpatient Hospital Stay (HOSPITAL_COMMUNITY): Payer: Medicare Other

## 2018-11-20 ENCOUNTER — Encounter (HOSPITAL_COMMUNITY)
Admission: EM | Disposition: A | Discharge status: Rehabilitation Facility | Payer: Self-pay | Source: Home / Self Care | Attending: Internal Medicine

## 2018-11-20 DIAGNOSIS — R7881 Bacteremia: Secondary | ICD-10-CM

## 2018-11-20 DIAGNOSIS — A4102 Sepsis due to Methicillin resistant Staphylococcus aureus: Principal | ICD-10-CM

## 2018-11-20 HISTORY — PX: AMPUTATION: SHX166

## 2018-11-20 HISTORY — PX: TEE WITHOUT CARDIOVERSION: SHX5443

## 2018-11-20 LAB — POCT I-STAT 4, (NA,K, GLUC, HGB,HCT)
GLUCOSE: 109 mg/dL — AB (ref 70–99)
HEMATOCRIT: 35 % — AB (ref 39.0–52.0)
Hemoglobin: 11.9 g/dL — ABNORMAL LOW (ref 13.0–17.0)
POTASSIUM: 4.6 mmol/L (ref 3.5–5.1)
Sodium: 137 mmol/L (ref 135–145)

## 2018-11-20 LAB — URINALYSIS, ROUTINE W REFLEX MICROSCOPIC
BILIRUBIN URINE: NEGATIVE
Glucose, UA: 50 mg/dL — AB
HGB URINE DIPSTICK: NEGATIVE
Ketones, ur: NEGATIVE mg/dL
LEUKOCYTES UA: NEGATIVE
NITRITE: NEGATIVE
Protein, ur: 100 mg/dL — AB
SPECIFIC GRAVITY, URINE: 1.013 (ref 1.005–1.030)
pH: 9 — ABNORMAL HIGH (ref 5.0–8.0)

## 2018-11-20 LAB — SURGICAL PCR SCREEN
MRSA, PCR: NEGATIVE
Staphylococcus aureus: POSITIVE — AB

## 2018-11-20 LAB — GLUCOSE, CAPILLARY
GLUCOSE-CAPILLARY: 130 mg/dL — AB (ref 70–99)
GLUCOSE-CAPILLARY: 133 mg/dL — AB (ref 70–99)
GLUCOSE-CAPILLARY: 130 mg/dL — AB (ref 70–99)
Glucose-Capillary: 158 mg/dL — ABNORMAL HIGH (ref 70–99)
Glucose-Capillary: 189 mg/dL — ABNORMAL HIGH (ref 70–99)

## 2018-11-20 LAB — ECHOCARDIOGRAM COMPLETE
HEIGHTINCHES: 73 in
Weight: 3269.86 oz

## 2018-11-20 LAB — VANCOMYCIN, RANDOM: Vancomycin Rm: 28

## 2018-11-20 LAB — HEMOGLOBIN A1C
Hgb A1c MFr Bld: 7.7 % — ABNORMAL HIGH (ref 4.8–5.6)
Mean Plasma Glucose: 174.29 mg/dL

## 2018-11-20 SURGERY — AMPUTATION BELOW KNEE
Anesthesia: General | Site: Leg Lower

## 2018-11-20 MED ORDER — LACTATED RINGERS IV SOLN: INTRAVENOUS | Status: DC | PRN

## 2018-11-20 MED ORDER — SODIUM CHLORIDE 0.9 % IV SOLN
INTRAVENOUS | Status: DC | PRN
  Administered 2018-11-20: 40 ug/min via INTRAVENOUS

## 2018-11-20 MED ORDER — PRO-STAT SUGAR FREE PO LIQD
30.0000 mL | Freq: Two times a day (BID) | ORAL | Status: DC
  Administered 2018-11-20 – 2018-11-22 (×4): 30 mL via ORAL
  Filled 2018-11-20 (×4): qty 30

## 2018-11-20 MED ORDER — FENTANYL CITRATE (PF) 250 MCG/5ML IJ SOLN
INTRAMUSCULAR | Status: AC
  Filled 2018-11-20: qty 5

## 2018-11-20 MED ORDER — HYDROMORPHONE HCL 1 MG/ML IJ SOLN
INTRAMUSCULAR | Status: AC
  Administered 2018-11-20: 0.5 mg via INTRAVENOUS
  Filled 2018-11-20: qty 1

## 2018-11-20 MED ORDER — HYDROMORPHONE HCL 1 MG/ML IJ SOLN
0.2500 mg | INTRAMUSCULAR | Status: DC | PRN
  Administered 2018-11-20 (×4): 0.5 mg via INTRAVENOUS

## 2018-11-20 MED ORDER — ONDANSETRON HCL 4 MG/2ML IJ SOLN
INTRAMUSCULAR | Status: DC | PRN
  Administered 2018-11-20: 4 mg via INTRAVENOUS

## 2018-11-20 MED ORDER — VANCOMYCIN HCL IN DEXTROSE 1-5 GM/200ML-% IV SOLN
INTRAVENOUS | Status: AC
  Filled 2018-11-20: qty 200

## 2018-11-20 MED ORDER — ACETAMINOPHEN 10 MG/ML IV SOLN
INTRAVENOUS | Status: AC
  Filled 2018-11-20: qty 100

## 2018-11-20 MED ORDER — NEOSTIGMINE METHYLSULFATE 10 MG/10ML IV SOLN
INTRAVENOUS | Status: DC | PRN
  Administered 2018-11-20: 3 mg via INTRAVENOUS

## 2018-11-20 MED ORDER — POVIDONE-IODINE 10 % EX SWAB: 2.0000 "application " | Freq: Once | CUTANEOUS | Status: DC

## 2018-11-20 MED ORDER — MUPIROCIN 2 % EX OINT
1.0000 "application " | TOPICAL_OINTMENT | Freq: Two times a day (BID) | CUTANEOUS | Status: DC
  Administered 2018-11-20 – 2018-11-22 (×4): 1 via NASAL
  Filled 2018-11-20 (×2): qty 22

## 2018-11-20 MED ORDER — ACETAMINOPHEN 10 MG/ML IV SOLN
1000.0000 mg | Freq: Once | INTRAVENOUS | Status: DC | PRN
  Administered 2018-11-20: 1000 mg via INTRAVENOUS

## 2018-11-20 MED ORDER — ROCURONIUM BROMIDE 10 MG/ML (PF) SYRINGE
PREFILLED_SYRINGE | INTRAVENOUS | Status: DC | PRN
  Administered 2018-11-20: 40 mg via INTRAVENOUS

## 2018-11-20 MED ORDER — OXYCODONE-ACETAMINOPHEN 5-325 MG PO TABS
1.0000 | ORAL_TABLET | ORAL | Status: DC | PRN
  Administered 2018-11-20 – 2018-11-22 (×5): 2 via ORAL
  Filled 2018-11-20 (×4): qty 2

## 2018-11-20 MED ORDER — GLYCOPYRROLATE 0.2 MG/ML IJ SOLN
INTRAMUSCULAR | Status: DC | PRN
  Administered 2018-11-20: 0.4 mg via INTRAVENOUS

## 2018-11-20 MED ORDER — LIDOCAINE 2% (20 MG/ML) 5 ML SYRINGE
INTRAMUSCULAR | Status: DC | PRN
  Administered 2018-11-20: 100 mg via INTRAVENOUS

## 2018-11-20 MED ORDER — SODIUM CHLORIDE 0.9 % IV SOLN: INTRAVENOUS | Status: DC

## 2018-11-20 MED ORDER — VANCOMYCIN HCL 1000 MG IV SOLR
INTRAVENOUS | Status: DC | PRN
  Administered 2018-11-20: 1000 mg via INTRAVENOUS

## 2018-11-20 MED ORDER — OXYCODONE-ACETAMINOPHEN 5-325 MG PO TABS
ORAL_TABLET | ORAL | Status: AC
  Filled 2018-11-20: qty 2

## 2018-11-20 MED ORDER — CHLORHEXIDINE GLUCONATE CLOTH 2 % EX PADS
6.0000 | MEDICATED_PAD | Freq: Every day | CUTANEOUS | Status: DC
  Administered 2018-11-22: 6 via TOPICAL

## 2018-11-20 MED ORDER — 0.9 % SODIUM CHLORIDE (POUR BTL) OPTIME
TOPICAL | Status: DC | PRN
  Administered 2018-11-20: 1000 mL

## 2018-11-20 MED ORDER — PROPOFOL 10 MG/ML IV BOLUS
INTRAVENOUS | Status: DC | PRN
  Administered 2018-11-20: 100 mg via INTRAVENOUS
  Administered 2018-11-20: 50 mg via INTRAVENOUS

## 2018-11-20 MED ORDER — VANCOMYCIN HCL IN DEXTROSE 1-5 GM/200ML-% IV SOLN: 1000.0000 mg | INTRAVENOUS | Status: DC

## 2018-11-20 MED ORDER — FERRIC CITRATE 1 GM 210 MG(FE) PO TABS
210.0000 mg | ORAL_TABLET | Freq: Three times a day (TID) | ORAL | Status: DC
  Administered 2018-11-20 – 2018-11-22 (×5): 210 mg via ORAL
  Filled 2018-11-20 (×8): qty 1

## 2018-11-20 MED ORDER — MEPERIDINE HCL 50 MG/ML IJ SOLN: 6.2500 mg | INTRAMUSCULAR | Status: DC | PRN

## 2018-11-20 MED ORDER — FENTANYL CITRATE (PF) 100 MCG/2ML IJ SOLN
INTRAMUSCULAR | Status: DC | PRN
  Administered 2018-11-20: 50 ug via INTRAVENOUS
  Administered 2018-11-20: 100 ug via INTRAVENOUS

## 2018-11-20 MED ORDER — PROMETHAZINE HCL 25 MG/ML IJ SOLN: 6.2500 mg | INTRAMUSCULAR | Status: DC | PRN

## 2018-11-20 MED ORDER — CHLORHEXIDINE GLUCONATE 4 % EX LIQD
60.0000 mL | Freq: Once | CUTANEOUS | Status: AC
  Administered 2018-11-20: 4 via TOPICAL
  Filled 2018-11-20: qty 60

## 2018-11-20 MED ORDER — HYDROMORPHONE HCL 1 MG/ML IJ SOLN
0.5000 mg | INTRAMUSCULAR | Status: DC | PRN
  Administered 2018-11-20 – 2018-11-22 (×5): 1 mg via INTRAVENOUS
  Filled 2018-11-20 (×5): qty 1

## 2018-11-20 MED ORDER — DEXAMETHASONE SODIUM PHOSPHATE 4 MG/ML IJ SOLN
INTRAMUSCULAR | Status: DC | PRN
  Administered 2018-11-20: 5 mg via INTRAVENOUS

## 2018-11-20 MED ORDER — HEPARIN SODIUM (PORCINE) 5000 UNIT/ML IJ SOLN
5000.0000 [IU] | Freq: Three times a day (TID) | INTRAMUSCULAR | Status: DC
  Administered 2018-11-21 – 2018-11-22 (×3): 5000 [IU] via SUBCUTANEOUS
  Filled 2018-11-20 (×3): qty 1

## 2018-11-20 MED ORDER — PROPOFOL 10 MG/ML IV BOLUS
INTRAVENOUS | Status: AC
  Filled 2018-11-20: qty 20

## 2018-11-20 SURGICAL SUPPLY — 51 items
BANDAGE ACE 6X5 VEL STRL LF (GAUZE/BANDAGES/DRESSINGS) ×4 IMPLANT
BLADE SAGITTAL (BLADE) ×2
BLADE SAW THK1.47X90X25X (BLADE) ×2 IMPLANT
BNDG GAUZE ELAST 4 BULKY (GAUZE/BANDAGES/DRESSINGS) ×4 IMPLANT
BOOTCOVER CLEANROOM LRG (PROTECTIVE WEAR) ×8 IMPLANT
COVER SURGICAL LIGHT HANDLE (MISCELLANEOUS) ×4 IMPLANT
COVER WAND RF STERILE (DRAPES) ×4 IMPLANT
CUFF TOURNIQUET SINGLE 34IN LL (TOURNIQUET CUFF) ×4 IMPLANT
CUFF TOURNIQUET SINGLE 44IN (TOURNIQUET CUFF) IMPLANT
DRAIN PENROSE 1/2X12 LTX STRL (WOUND CARE) ×4 IMPLANT
DRSG PAD ABDOMINAL 8X10 ST (GAUZE/BANDAGES/DRESSINGS) ×12 IMPLANT
DURAPREP 26ML APPLICATOR (WOUND CARE) ×4 IMPLANT
GAUZE SPONGE 4X4 12PLY STRL (GAUZE/BANDAGES/DRESSINGS) ×4 IMPLANT
GAUZE XEROFORM 5X9 LF (GAUZE/BANDAGES/DRESSINGS) ×4 IMPLANT
GLOVE BIOGEL PI IND STRL 6.5 (GLOVE) ×6 IMPLANT
GLOVE BIOGEL PI IND STRL 8 (GLOVE) ×4 IMPLANT
GLOVE BIOGEL PI INDICATOR 6.5 (GLOVE) ×6
GLOVE BIOGEL PI INDICATOR 8 (GLOVE) ×4
GLOVE ECLIPSE 6.5 STRL STRAW (GLOVE) ×4 IMPLANT
GLOVE ECLIPSE 7.5 STRL STRAW (GLOVE) ×8 IMPLANT
GLOVE SURG SS PI 6.0 STRL IVOR (GLOVE) ×8 IMPLANT
GOWN STRL REUS W/ TWL LRG LVL3 (GOWN DISPOSABLE) ×2 IMPLANT
GOWN STRL REUS W/ TWL XL LVL3 (GOWN DISPOSABLE) ×4 IMPLANT
GOWN STRL REUS W/TWL LRG LVL3 (GOWN DISPOSABLE) ×2
GOWN STRL REUS W/TWL XL LVL3 (GOWN DISPOSABLE) ×4
KIT BASIN OR (CUSTOM PROCEDURE TRAY) ×4 IMPLANT
KIT TURNOVER KIT B (KITS) ×4 IMPLANT
MANIFOLD NEPTUNE II (INSTRUMENTS) IMPLANT
NS IRRIG 1000ML POUR BTL (IV SOLUTION) ×4 IMPLANT
PACK ORTHO EXTREMITY (CUSTOM PROCEDURE TRAY) ×4 IMPLANT
PAD ARMBOARD 7.5X6 YLW CONV (MISCELLANEOUS) ×8 IMPLANT
PAD CAST 4YDX4 CTTN HI CHSV (CAST SUPPLIES) ×2 IMPLANT
PADDING CAST COTTON 4X4 STRL (CAST SUPPLIES) ×2
SOL PREP POV-IOD 4OZ 10% (MISCELLANEOUS) ×4 IMPLANT
SPONGE LAP 18X18 RF (DISPOSABLE) ×4 IMPLANT
STAPLER VISISTAT 35W (STAPLE) ×4 IMPLANT
SUT ETHILON 3 0 PS 1 (SUTURE) ×8 IMPLANT
SUT SILK 1 TIES 10X30 (SUTURE) ×4 IMPLANT
SUT SILK 2 0 SH CR/8 (SUTURE) ×4 IMPLANT
SUT SILK 2 0 TIES 10X30 (SUTURE) IMPLANT
SUT VIC AB 0 CT1 27 (SUTURE) ×4
SUT VIC AB 0 CT1 27XBRD ANBCTR (SUTURE) ×4 IMPLANT
SUT VIC AB 0 CTB1 27 (SUTURE) ×4 IMPLANT
SUT VIC AB 1 CTB1 27 (SUTURE) ×8 IMPLANT
SUT VIC AB 2-0 CT1 27 (SUTURE) ×2
SUT VIC AB 2-0 CT1 TAPERPNT 27 (SUTURE) ×2 IMPLANT
SUT VIC AB 2-0 CTB1 (SUTURE) ×4 IMPLANT
TOWEL OR 17X24 6PK STRL BLUE (TOWEL DISPOSABLE) ×4 IMPLANT
TOWEL OR 17X26 10 PK STRL BLUE (TOWEL DISPOSABLE) ×4 IMPLANT
UNDERPAD 30X30 (UNDERPADS AND DIAPERS) ×8 IMPLANT
WATER STERILE IRR 1000ML POUR (IV SOLUTION) ×4 IMPLANT

## 2018-11-20 NOTE — Progress Notes (Signed)
  Echocardiogram 2D Echocardiogram has been performed.  [name] M 11/20/2018, 10:20 AM

## 2018-11-20 NOTE — Anesthesia Procedure Notes (Signed)
Procedure Name: Intubation Date/Time: 11/20/2018 3:36 PM Performed by: Oletta Lamas, CRNA Pre-anesthesia Checklist: Patient identified, Emergency Drugs available, Suction available and Patient being monitored Patient Re-evaluated:Patient Re-evaluated prior to induction Oxygen Delivery Method: Circle System Utilized Preoxygenation: Pre-oxygenation with 100% oxygen Induction Type: IV induction Ventilation: Mask ventilation without difficulty Laryngoscope Size: Miller and 2 Grade View: Grade I Tube type: Oral Tube size: 7.5 mm Number of attempts: 2 Airway Equipment and Method: Stylet and Oral airway Placement Confirmation: ETT inserted through vocal cords under direct vision,  positive ETCO2 and breath sounds checked- equal and bilateral Secured at: 23 cm Tube secured with: Tape Dental Injury: Teeth and Oropharynx as per pre-operative assessment

## 2018-11-20 NOTE — Progress Notes (Signed)
PROGRESS NOTE                                                                                                                                                                                                             Patient Demographics:    [name], is a 67 y.o. male, DOB - July 15, 1951, [id number]  Admit date - 11/17/2018   Admitting Physician [name], MD  Outpatient Primary MD for the patient is [name], MD  LOS - 3   No chief complaint on file.      Brief Narrative   67 y.o. male  with past medical history of diabetes end-stage renal disease on hemodialysis Monday Wednesday on Friday type 2 diabetes mellitus hypertension cardiomyopathy severe peripheral vascular disease multiple toe amputations bilaterally.  Bilateral foot ulcer, sent by hemodialysis centers for positive blood cultures for MRSA with known chronic LEFT  osteomyelitis, chronic left lower extremity wound, MRI showing extensive ostium myelitis of left foot with involvement of the cuboid and cuneiform, seen by orthopedic, plan for left BKA 11/20/2018.    Subjective:    [name] today has, No headache, No chest pain, No abdominal pain , ports good appetite, no nausea or vomiting.  Assessment  & Plan :    Principal Problem:   Sepsis ([place]) Active Problems:   HTN (hypertension)   Anemia due to chronic blood loss   Osteomyelitis of left foot (HCC)   Type 2 diabetes mellitus with left diabetic foot ulcer ([place])   ESRD on hemodialysis ([place])   Diabetic ulcer of foot associated with diabetes mellitus due to underlying condition, with necrosis of muscle ([place])  MRSA bacteremia continue to infected left foot/osteomyelitis - He had blood culture positive for staph at hemodialysis center 11/14/2018, admitted for further work-up, following repeat blood cultures 11/17/2018. -Secondary to severely infected left foot with osteomyelitis, orthopedic on but greatly  appreciated, and for BKA left day . -Right foot MRI with no evidence of osteomyelitis . -2D echo with no significant valvular disease, cardiology to perform TEE . -ID input greatly appreciated, continue with vancomycin  Left foot infection/chronic osteomyelitis -Please see above discussion  ESRD -Renal consulted , HD per renal  Hypertension -Does appear patient on Midodrin, blood pressure uncontrolled, I have started Midodrin  PAD  - s/p L tibial angioplasty/stenting 04/2018.  Followed Dr.  Scot Dock.  On Plavix, currently on hold in anticipation for surgery, will discuss with Ortho when appropriate to resume .  Diabetes mellitus type 2 -Continue with insulin sliding scale, CBG overall acceptable, A1c is 7.7  Anemia Of chronic kidney disease -Hemoglobin 9.6 ,continue to monitor closely    Code Status : Full  Family Communication  : wife at bedside  Disposition Plan  : pending further work up  Consults  :  Renal/ID  Procedures  : none  DVT Prophylaxis  :   heaprin  Lab Results  Component Value Date   PLT 245 11/19/2018    Antibiotics  :    Anti-infectives (From admission, onward)   Start     Dose/Rate Route Frequency Ordered Stop   11/20/18 0600  vancomycin (VANCOCIN) IVPB 1000 mg/200 mL premix     1,000 mg 200 mL/hr over 60 Minutes Intravenous On call to O.R. 11/20/18 0110 11/21/18 0559   11/19/18 1600  vancomycin (VANCOCIN) IVPB 1000 mg/200 mL premix     1,000 mg 200 mL/hr over 60 Minutes Intravenous  Once 11/19/18 1447 11/19/18 1808   11/18/18 2000  ceFEPIme (MAXIPIME) 1 g in sodium chloride 0.9 % 100 mL IVPB  Status:  Discontinued     1 g 200 mL/hr over 30 Minutes Intravenous Every 24 hours 11/17/18 1808 11/19/18 1447   11/18/18 1530  vancomycin (VANCOCIN) IVPB 1000 mg/200 mL premix     1,000 mg 200 mL/hr over 60 Minutes Intravenous  Once 11/18/18 1459 11/18/18 1816   11/17/18 2200  ceFEPIme (MAXIPIME) 2 g in sodium chloride 0.9 % 100 mL IVPB  Status:   Discontinued     2 g 200 mL/hr over 30 Minutes Intravenous Every 8 hours 11/17/18 1827 11/17/18 1829   11/17/18 1826  vancomycin variable dose per unstable renal function (pharmacist dosing)      Does not apply See admin instructions 11/17/18 1826     11/17/18 1400  ceFEPIme (MAXIPIME) 2 g in sodium chloride 0.9 % 100 mL IVPB     2 g 200 mL/hr over 30 Minutes Intravenous  Once 11/17/18 1353 11/17/18 1510        Objective:   Vitals:   11/19/18 1700 11/19/18 2139 11/20/18 0439 11/20/18 0923  BP: (!) 147/77 133/76 136/66 139/70  Pulse: 73 72 73 76  Resp: 18 20 18 18   Temp: 98.3 F (36.8 C) 99.1 F (37.3 C) 99.3 F (37.4 C) 99.1 F (37.3 C)  TempSrc: Oral Oral Oral Oral  SpO2: 100% 97% 96% 98%  Weight:      Height:        Wt Readings from Last 3 Encounters:  11/19/18 92.7 kg  11/08/18 99.3 kg  10/31/18 97 kg     Intake/Output Summary (Last 24 hours) at 11/20/2018 1356 Last data filed at 11/20/2018 0600 Gross per 24 hour  Intake 460 ml  Output -  Net 460 ml     Physical Exam  Awake Alert, Oriented X 3, No new F.N deficits, Normal affect Symmetrical Chest wall movement, Good air movement bilaterally, CTAB RRR,No Gallops,Rubs or new Murmurs, No Parasternal Heave +ve B.Sounds, Abd Soft, No tenderness, No rebound - guarding or rigidity. Left lower extremity with lateral wound, foul-smelling odor,    Data Review:    CBC Recent Labs  Lab 11/17/18 1346 11/18/18 0622 11/19/18 0556  WBC 9.4 6.2 7.7  HGB 11.3* 9.6* 10.2*  HCT 36.3* 30.7* 32.5*  PLT 278 260 245  MCV 90.8 90.0 90.3  MCH 28.3 28.2 28.3  MCHC 31.1 31.3 31.4  RDW 16.2* 16.2* 16.2*  LYMPHSABS 1.5  --   --   MONOABS 1.2*  --   --   EOSABS 0.3  --   --   BASOSABS 0.0  --   --     Chemistries  Recent Labs  Lab 11/17/18 1346 11/18/18 0622 11/19/18 0556  NA 136 134* 136  K 3.5 3.9 4.0  CL 92* 93* 98  CO2 27 30 29   GLUCOSE 139* 164* 189*  BUN 23 30* 23  CREATININE 8.28* 9.95* 7.42*    CALCIUM 8.5* 7.7* 8.0*  AST 49*  --   --   ALT 37  --   --   ALKPHOS 54  --   --   BILITOT 0.9  --   --    ------------------------------------------------------------------------------------------------------------------ No results for input(s): CHOL, HDL, LDLCALC, TRIG, CHOLHDL, LDLDIRECT in the last 72 hours.  Lab Results  Component Value Date   HGBA1C 7.7 (H) 11/20/2018   ------------------------------------------------------------------------------------------------------------------ No results for input(s): TSH, T4TOTAL, T3FREE, THYROIDAB in the last 72 hours.  Invalid input(s): FREET3 ------------------------------------------------------------------------------------------------------------------ No results for input(s): VITAMINB12, FOLATE, FERRITIN, TIBC, IRON, RETICCTPCT in the last 72 hours.  Coagulation profile No results for input(s): INR, PROTIME in the last 168 hours.  No results for input(s): DDIMER in the last 72 hours.  Cardiac Enzymes No results for input(s): CKMB, TROPONINI, MYOGLOBIN in the last 168 hours.  Invalid input(s): CK ------------------------------------------------------------------------------------------------------------------ No results found for: BNP  Inpatient Medications  Scheduled Meds: . atorvastatin  40 mg Oral QHS  . Chlorhexidine Gluconate Cloth  6 each Topical Daily  . doxercalciferol  3 mcg Intravenous Q M,W,F-HD  . feeding supplement (PRO-STAT SUGAR FREE 64)  30 mL Oral BID  . ferric citrate  210 mg Oral TID WC  . heparin  5,000 Units Subcutaneous Q8H  . insulin aspart  0-9 Units Subcutaneous TID WC  . insulin aspart protamine- aspart  5 Units Subcutaneous BID WC  . multivitamin with minerals  1 tablet Oral Q supper  . mupirocin ointment  1 application Nasal BID  . povidone-iodine  2 application Topical Once  . primidone  50 mg Oral QHS  . timolol  1 drop Left Eye BID  . vancomycin variable dose per unstable renal  function (pharmacist dosing)   Does not apply See admin instructions   Continuous Infusions: . sodium chloride    . sodium chloride    . vancomycin     PRN Meds:.sodium chloride, sodium chloride, acetaminophen, heparin, lidocaine (PF), lidocaine-prilocaine, mupirocin ointment, pentafluoroprop-tetrafluoroeth  Micro Results Recent Results (from the past 240 hour(s))  Blood Culture (routine x 2)     Status: None (Preliminary result)   Collection Time: 11/17/18  1:46 PM  Result Value Ref Range Status   Specimen Description BLOOD RIGHT ANTECUBITAL  Final   Special Requests   Final    BOTTLES DRAWN AEROBIC AND ANAEROBIC Blood Culture adequate volume   Culture   Final    NO GROWTH 3 DAYS Performed at [facility], [place] [address] [place],  [address]    Report Status PENDING  Incomplete  Blood Culture (routine x 2)     Status: None (Preliminary result)   Collection Time: 11/17/18  1:48 PM  Result Value Ref Range Status   Specimen Description BLOOD RIGHT HAND  Final   Special Requests   Final    BOTTLES DRAWN AEROBIC AND ANAEROBIC Blood Culture adequate  volume   Culture   Final    NO GROWTH 3 DAYS Performed at [facility], [place] [address] [place], [place] [address]    Report Status PENDING  Incomplete  Surgical pcr screen     Status: Abnormal   Collection Time: 11/20/18  1:10 AM  Result Value Ref Range Status   MRSA, PCR NEGATIVE NEGATIVE Final   Staphylococcus aureus POSITIVE (A) NEGATIVE Final    Comment: RESULT CALLED TO, READ BACK BY AND VERIFIED WITH: RN T. Ellender Hose [phone] @0602  THANEY (NOTE) The Xpert SA Assay (FDA approved for NASAL specimens in patients 66 years of age and older), is one component of a comprehensive surveillance program. It is not intended to diagnose infection nor to guide or monitor treatment.     Radiology Reports Mr Foot Right Wo Contrast  Result Date: 11/19/2018 CLINICAL DATA:  Right foot wound. Bacteremia. Concern for  osteomyelitis. End-stage renal disease. EXAM: MRI OF THE RIGHT FOREFOOT WITHOUT CONTRAST TECHNIQUE: Multiplanar, multisequence MR imaging of the right forefoot was performed. No intravenous contrast was administered. COMPARISON:  Previous MRI 05/12/2018.  Radiographs 01/12/2018. FINDINGS: Bones/Joint/Cartilage Multiple prior amputations are again noted, including amputations of the 2nd and 4th toes, the distal 3rd toe and the distal 5th metatarsal. These all appear stable. There is new extensive signal abnormality traversing the base of the 3rd metatarsal, most consistent with a stress fracture. There is associated bone marrow and surrounding soft tissue edema as well as cortical thickening. The additional metatarsal bases are intact and normally aligned. There are stable moderate degenerative changes at the 1st metatarsophalangeal joint and it sesamoids. The tarsal bones appears stable. There are no significant joint effusions. Ligaments Intact Lisfranc ligament. Muscles and Tendons Mild muscular atrophy.  No significant tenosynovitis. Soft tissues As above, there is edema within the soft tissue surrounding the 3rd metatarsal base. The previously demonstrated soft tissue ulceration along the plantar aspect of the forefoot has improved. No focal fluid collection or generalized inflammation identified. IMPRESSION: 1. New abnormality involving the base of the 3rd metatarsal, most consistent with a stress fracture. 2. No evidence of recurrent osteomyelitis. 3. Previously demonstrated inflammatory changes involving the plantar forefoot have improved. No evidence of soft tissue abscess. 4. Multiple previous amputations. Chronic degenerative changes at the 1st metatarsophalangeal joint. Electronically Signed   By: Richardean Sale M.D.   On: 11/19/2018 16:49   Mr Foot Left Wo Contrast  Result Date: 11/19/2018 CLINICAL DATA:  Sepsis EXAM: MRI OF THE LEFT HINDFOOT WITHOUT CONTRAST Osteomyelitis. Bony destruction of the  base of the fourth metatarsal compatible with osteomyelitis. TECHNIQUE: Multiplanar, multisequence MR imaging of the ankle was performed. No intravenous contrast was administered. COMPARISON:  08/31/2018 FINDINGS: TENDONS Peroneal: Prominent peroneus longus tendinopathy posterior to the lateral malleolus. Poor definition of the distal peroneus longus tendon with at least partial tearing. The peroneus brevis is discontinuous as expected given that the fifth digit is absent. Posteromedial: Distal tibialis posterior tendinopathy. Anterior: Unremarkable Achilles: Unremarkable Plantar Fascia: Thickened medial band plantar fascia. LIGAMENTS Lateral: Thickened anterior inferior tibiofibular ligament. Medial: Irregularity and likely erosion along the lateral malleolar attachment of the deep tibiotalar portion of the deltoid ligament. Thickened superomedial portion of the spring ligament. Thickened Lisfranc ligament. CARTILAGE Ankle Joint: Degenerative chondral thinning. Subtalar Joints/Sinus Tarsi: Degenerative chondral thinning. Bones: Significant progression of destructive osseous edema involving the cuboid and lateral cuneiform. Significant increase in edema suspicious for osteomyelitis in the middle cuneiform. Worsening of arthropathy at the articulation between the navicular  and medial cuneiform, osteomyelitis not excluded. There is abnormal edema in the bases of the second and third metatarsals suspicious for osteomyelitis. Bony destruction of the base of the fourth metatarsal, compatible with osteomyelitis. Other: Subcutaneous edema along the lateral ankle and tracking especially in the plantar foot. Wound underneath the cuboid, likely ulceration. IMPRESSION: 1. Active osteomyelitis involving the cuboid, lateral cuneiform, and base of the fourth metatarsal. Probable osteomyelitis in the middle cuneiform and bases of the second and third metatarsals. Most of the edema at the articulation between the navicular and  medial cuneiform is likely degenerative. 2. Suspected ulceration adjacent to the cuboid. 3. Absent fifth ray with discontinuous peroneus brevis tendon. Prominent thickening and irregularity and at least partial tearing of the peroneus longus. 4. Distal tibialis posterior tendinopathy. 5. Thickened medial band of the plantar fascia, possibly from plantar fasciitis. 6. Irregularity along the lateral malleolar attachment of the deep tibiotalar portion of the deltoid ligament. Thickened superomedial portion of the spring ligament. Electronically Signed   By: Van Clines M.D.   On: 11/19/2018 21:38   Dg Chest Port 1 View  Result Date: 11/17/2018 CLINICAL DATA:  67 year old male with shortness of breath, weakness and sepsis EXAM: PORTABLE CHEST 1 VIEW COMPARISON:  Prior chest x-ray 05/10/2018 FINDINGS: The lungs are clear and negative for focal airspace consolidation, pulmonary edema or suspicious pulmonary nodule. No pleural effusion or pneumothorax. Cardiac and mediastinal contours are within normal limits. Atherosclerotic plaque present in the transverse aorta. No acute fracture or lytic or blastic osseous lesions. The visualized upper abdominal bowel gas pattern is unremarkable. IMPRESSION: No acute cardiopulmonary process. Aortic Atherosclerosis (ICD10-170.0) Electronically Signed   By: Jacqulynn Cadet M.D.   On: 11/17/2018 14:08   Vas [place] Burnard Bunting With/wo Tbi  Result Date: 11/08/2018 LOWER EXTREMITY DOPPLER STUDY Indications: Peripheral artery disease. High Risk Factors: Hypertension, insulin dependent diabetes mellitus, Diabetes,                    past history of smoking.  Vascular               Left AVF Interventions:         Angioplasty and stent of left tibioperoneal trunk                        05/11/2018. Limitations: Patient refused bandage wrappings of the right lower extremity to              be removed. Unable to perform right ABIs. Performing Technologist: Ronal Fear RVS, RCS   Examination Guidelines: A complete evaluation includes at minimum, Doppler waveform signals and systolic blood pressure reading at the level of bilateral brachial, anterior tibial, and posterior tibial arteries, when vessel segments are accessible. Bilateral testing is considered an integral part of a complete examination. Photoelectric Plethysmograph (PPG) waveforms and toe systolic pressure readings are included as required and additional duplex testing as needed. Limited examinations for reoccurring indications may be performed as noted.  ABI Findings: +--------+------------------+-----+--------+--------+ Right   Rt Pressure (mmHg)IndexWaveformComment  +--------+------------------+-----+--------+--------+ [id number]                                     +--------+------------------+-----+--------+--------+ +----+------------------+-----+--------+-------+ LeftLt Pressure (mmHg)IndexWaveformComment +----+------------------+-----+--------+-------+ PTA 173               1.10 biphasic        +----+------------------+-----+--------+-------+ DP  138  0.88 biphasic        +----+------------------+-----+--------+-------+ +-------+-----------+-----------+------------+------------+ ABI/TBIToday's ABIToday's TBIPrevious ABIPrevious TBI +-------+-----------+-----------+------------+------------+ Right                        1.43        0.41         +-------+-----------+-----------+------------+------------+ Left   1.10       0.88       1.21        0.32         +-------+-----------+-----------+------------+------------+ Arterial wall calcification precludes accurate ankle pressures and ABIs. Circumferential wall calcification observed on 2D imaging.  Summary: Left: Although ankle brachial indices are within normal limits (0.95-1.29), arterial Doppler waveforms at the ankle suggest some component of arterial occlusive disease.  *See table(s) above for measurements and  observations.  Electronically signed by Deitra Mayo MD on 11/08/2018 at 10:00:28 AM.   Final    Vas [place] Lower Extremity Arterial Duplex  Result Date: 11/08/2018 LOWER EXTREMITY ARTERIAL DUPLEX STUDY Indications: Peripheral artery disease. High Risk Factors: Hypertension, insulin dependent diabetes mellitus, Diabetes.  Vascular               Left AVF Interventions:         Angioplasty and stent of left tibioperoneal trunk                        05/11/2018. Current ABI:           R=not performed, L=1.10 Performing Technologist: Ronal Fear RVS, RCS  Examination Guidelines: A complete evaluation includes B-mode imaging, spectral Doppler, color Doppler, and power Doppler as needed of all accessible portions of each vessel. Bilateral testing is considered an integral part of a complete examination. Limited examinations for reoccurring indications may be performed as noted.  Left Duplex Findings: +-----------+--------+-----+--------+--------+--------+            PSV cm/sRatioStenosisWaveformComments +-----------+--------+-----+--------+--------+--------+ CFA Distal 130                  biphasic         +-----------+--------+-----+--------+--------+--------+ DFA        99                   biphasic         +-----------+--------+-----+--------+--------+--------+ SFA Prox   122                  biphasic         +-----------+--------+-----+--------+--------+--------+ SFA Mid    149                  biphasic         +-----------+--------+-----+--------+--------+--------+ SFA Distal 105                  biphasic         +-----------+--------+-----+--------+--------+--------+ POP Prox   129                  biphasic         +-----------+--------+-----+--------+--------+--------+ POP Distal 151                  biphasic         +-----------+--------+-----+--------+--------+--------+ ATA Prox   80                   biphasic          +-----------+--------+-----+--------+--------+--------+ ATA Mid    64  biphasic         +-----------+--------+-----+--------+--------+--------+ PTA Prox   166                  biphasic         +-----------+--------+-----+--------+--------+--------+ PTA Mid    156                  biphasic         +-----------+--------+-----+--------+--------+--------+ PTA Distal 144                  biphasic         +-----------+--------+-----+--------+--------+--------+ PERO Prox                       absent           +-----------+--------+-----+--------+--------+--------+ PERO Mid                        absent           +-----------+--------+-----+--------+--------+--------+ PERO Distal                     absent           +-----------+--------+-----+--------+--------+--------+ DP         58                   biphasic         +-----------+--------+-----+--------+--------+--------+  Summary: Left: Patent left lower extremity arterial system and left tibioperoneal artery stent without evidence of hemodynamically significant stenosis. Circumferential wall calcification is observed throughout the left lower extremity.  See table(s) above for measurements and observations. Electronically signed by Deitra Mayo MD on 11/08/2018 at 9:42:03 AM.    Final      Phillips Climes M.D on 11/20/2018 at 1:56 PM  Between 7am to 7pm - Pager - [phone]  After 7pm go to [email or web address] - password [facility]  [facility] -  Office  [phone]

## 2018-11-20 NOTE — Consult Note (Signed)
            [facility] CM Primary Care Navigator  11/20/2018  [name] Sr. 1951-07-07 [id number]   Went to see patient at the bedside to identify possible discharge needs butheis off the unit in the OR for surgery at this time. (left below the knee amputation)  Will attempt to see patient at another time whenheis available in the room.   Addendum (11/22/18):  Went back to seepatientat the bedsideto identify possible discharge needs.  Patient verbalized having fever/ chills and weakness; was sent by hemodialysis center for MRSA positive blood cultures and known chronic leftosteomyelitis and chronic right lower extremity wound, with MRI revealing extensive osteomyelitis of left foot that had resulted to this admission/ surgery. (MRSA- bacteremia secondary to left foot osteomyelitis, post left BKA- below the knee amputation)  Patient states that hisprimary care provideris Dr. [name] with [facility] (not a [facility] provider and was confirmed with their office that they are not affiliated with [facility] or [place]).   Patientreports that he will be discharging [facility] per therapy recommendation and he was encouragedto follow-up withhisprimary care provider whenhereturns back home in order to help managehishealth issues/ needs.  [facility] care management information provided for future needs that may arise.   For additional questions please contact:  Edwena Felty A. Harrietta Incorvaia, BSN, RN-BC [facility] PRIMARY CARE Navigator Cell: [phone]

## 2018-11-20 NOTE — Progress Notes (Addendum)
[facility] Progress Note   Subjective: Patient seen with wife at bedside. Seems accepting of BKA-he has struggled with this issue for quite sometime. No C/Os. 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    Objective Vitals:   11/19/18 1700 11/19/18 2139 11/20/18 0439 11/20/18 0923  BP: (!) 147/77 133/76 136/66 139/70  Pulse: 73 72 73 76  Resp: 18 20 18 18   Temp: 98.3 F (36.8 C) 99.1 F (37.3 C) 99.3 F (37.4 C) 99.1 F (37.3 C)  TempSrc: Oral Oral Oral Oral  SpO2: 100% 97% 96% 98%  Weight:      Height:       Physical Exam General: pleasant, NAD  Heart: S1.S2 RRR Lungs: CTAB A/P Abdomen: Active BS Extremities: Bilateral heel wounds. L foot with toe amps. No LE edema.  Dialysis Access: L AVF + bruit    Additional Objective Labs: Basic Metabolic Panel: Recent Labs  Lab 11/17/18 1346 11/18/18 0622 11/19/18 0556  NA 136 134* 136  K 3.5 3.9 4.0  CL 92* 93* 98  CO2 27 30 29   GLUCOSE 139* 164* 189*  BUN 23 30* 23  CREATININE 8.28* 9.95* 7.42*  CALCIUM 8.5* 7.7* 8.0*  PHOS  --   --  3.1   Liver Function Tests: Recent Labs  Lab 11/17/18 1346 11/19/18 0556  AST 49*  --   ALT 37  --   ALKPHOS 54  --   BILITOT 0.9  --   PROT 8.4*  --   ALBUMIN 2.8* 2.2*   No results for input(s): LIPASE, AMYLASE in the last 168 hours. CBC: Recent Labs  Lab 11/17/18 1346 11/18/18 0622 11/19/18 0556  WBC 9.4 6.2 7.7  NEUTROABS 6.4  --   --   HGB 11.3* 9.6* 10.2*  HCT 36.3* 30.7* 32.5*  MCV 90.8 90.0 90.3  PLT 278 260 245   Blood Culture    Component Value Date/Time   SDES BLOOD RIGHT HAND 11/17/2018 1348   SPECREQUEST  11/17/2018 1348    BOTTLES DRAWN AEROBIC AND ANAEROBIC Blood Culture adequate volume   CULT  11/17/2018 1348    NO GROWTH 3 DAYS Performed at [facility], [place] [address] [place], [place] [address]    REPTSTATUS PENDING 11/17/2018 1348    Cardiac Enzymes: No results for input(s): CKTOTAL, CKMB, CKMBINDEX, TROPONINI in the last 168  hours. CBG: Recent Labs  Lab 11/18/18 1722 11/18/18 2048 11/19/18 1155 11/19/18 1702 11/20/18 0755  GLUCAP 139* 118* 107* 176* 130*   Iron Studies: No results for input(s): IRON, TIBC, TRANSFERRIN, FERRITIN in the last 72 hours. @lablastinr3 @ Studies/Results: Mr Foot Right Wo Contrast  Result Date: 11/19/2018 CLINICAL DATA:  Right foot wound. Bacteremia. Concern for osteomyelitis. End-stage renal disease. EXAM: MRI OF THE RIGHT FOREFOOT WITHOUT CONTRAST TECHNIQUE: Multiplanar, multisequence MR imaging of the right forefoot was performed. No intravenous contrast was administered. COMPARISON:  Previous MRI 05/12/2018.  Radiographs 01/12/2018. FINDINGS: Bones/Joint/Cartilage Multiple prior amputations are again noted, including amputations of the 2nd and 4th toes, the distal 3rd toe and the distal 5th metatarsal. These all appear stable. There is new extensive signal abnormality traversing the base of the 3rd metatarsal, most consistent with a stress fracture. There is associated bone marrow and surrounding soft tissue edema as well as cortical thickening. The additional metatarsal bases are intact and normally aligned. There are stable moderate degenerative changes at the 1st metatarsophalangeal joint and it sesamoids. The tarsal bones appears stable. There are no significant joint effusions. Ligaments Intact Lisfranc  ligament. Muscles and Tendons Mild muscular atrophy.  No significant tenosynovitis. Soft tissues As above, there is edema within the soft tissue surrounding the 3rd metatarsal base. The previously demonstrated soft tissue ulceration along the plantar aspect of the forefoot has improved. No focal fluid collection or generalized inflammation identified. IMPRESSION: 1. New abnormality involving the base of the 3rd metatarsal, most consistent with a stress fracture. 2. No evidence of recurrent osteomyelitis. 3. Previously demonstrated inflammatory changes involving the plantar forefoot have  improved. No evidence of soft tissue abscess. 4. Multiple previous amputations. Chronic degenerative changes at the 1st metatarsophalangeal joint. Electronically Signed   By: Richardean Sale M.D.   On: 11/19/2018 16:49   Mr Foot Left Wo Contrast  Result Date: 11/19/2018 CLINICAL DATA:  Sepsis EXAM: MRI OF THE LEFT HINDFOOT WITHOUT CONTRAST Osteomyelitis. Bony destruction of the base of the fourth metatarsal compatible with osteomyelitis. TECHNIQUE: Multiplanar, multisequence MR imaging of the ankle was performed. No intravenous contrast was administered. COMPARISON:  08/31/2018 FINDINGS: TENDONS Peroneal: Prominent peroneus longus tendinopathy posterior to the lateral malleolus. Poor definition of the distal peroneus longus tendon with at least partial tearing. The peroneus brevis is discontinuous as expected given that the fifth digit is absent. Posteromedial: Distal tibialis posterior tendinopathy. Anterior: Unremarkable Achilles: Unremarkable Plantar Fascia: Thickened medial band plantar fascia. LIGAMENTS Lateral: Thickened anterior inferior tibiofibular ligament. Medial: Irregularity and likely erosion along the lateral malleolar attachment of the deep tibiotalar portion of the deltoid ligament. Thickened superomedial portion of the spring ligament. Thickened Lisfranc ligament. CARTILAGE Ankle Joint: Degenerative chondral thinning. Subtalar Joints/Sinus Tarsi: Degenerative chondral thinning. Bones: Significant progression of destructive osseous edema involving the cuboid and lateral cuneiform. Significant increase in edema suspicious for osteomyelitis in the middle cuneiform. Worsening of arthropathy at the articulation between the navicular and medial cuneiform, osteomyelitis not excluded. There is abnormal edema in the bases of the second and third metatarsals suspicious for osteomyelitis. Bony destruction of the base of the fourth metatarsal, compatible with osteomyelitis. Other: Subcutaneous edema along  the lateral ankle and tracking especially in the plantar foot. Wound underneath the cuboid, likely ulceration. IMPRESSION: 1. Active osteomyelitis involving the cuboid, lateral cuneiform, and base of the fourth metatarsal. Probable osteomyelitis in the middle cuneiform and bases of the second and third metatarsals. Most of the edema at the articulation between the navicular and medial cuneiform is likely degenerative. 2. Suspected ulceration adjacent to the cuboid. 3. Absent fifth ray with discontinuous peroneus brevis tendon. Prominent thickening and irregularity and at least partial tearing of the peroneus longus. 4. Distal tibialis posterior tendinopathy. 5. Thickened medial band of the plantar fascia, possibly from plantar fasciitis. 6. Irregularity along the lateral malleolar attachment of the deep tibiotalar portion of the deltoid ligament. Thickened superomedial portion of the spring ligament. Electronically Signed   By: Van Clines M.D.   On: 11/19/2018 21:38   Medications: . sodium chloride    . sodium chloride    . vancomycin     . atorvastatin  40 mg Oral QHS  . Chlorhexidine Gluconate Cloth  6 each Topical Daily  . doxercalciferol  3 mcg Intravenous Q M,W,F-HD  . ferric citrate  630 mg Oral TID WC  . heparin  5,000 Units Subcutaneous Q8H  . insulin aspart  0-9 Units Subcutaneous TID WC  . insulin aspart protamine- aspart  5 Units Subcutaneous BID WC  . multivitamin with minerals  1 tablet Oral Q supper  . mupirocin ointment  1 application Nasal BID  . povidone-iodine  2 application Topical Once  . primidone  50 mg Oral QHS  . timolol  1 drop Left Eye BID  . vancomycin variable dose per unstable renal function (pharmacist dosing)   Does not apply See admin instructions     Dialysis Orders:  [facility] MWF 4.25h 500/800 EDW 95.5kg 2K/2.25Ca UFP 2 AVF No Heparin -Hectorol 3 mcg IV TIW -Parsabiv 7.5 mg IV TIW  Assessment/Plan: 1. MRSA bacteremia - most likely 2/2 Chronic  bilateral foot wounds Hx osteomyelitis of L foot. . Blood cultures drawn at outpatient kidney center on 11/19 +MRSA. Repeat cultures pending here. On vanc/cefepime.ID/Ortho following. MRI 11/24 revealed active osteomyelitis involving cubiod, lateral cuneiform and base of 4th metarsal. Probable osteo mid cuneiform and bases of the second and third metatarsal. Seen by ortho-recommended L BKA today.  2. ESRD -MWF. Unable to complete HD Friday d/t fevers. Short HD 11/18/18. HD on holiday schedule this week. Next HD tomorrow.    3. Hypertension/volume - Better control of hypertension this AM. On midodrine as outpatient. Monitor. No gross volume on exam. HD 11/19/18 Pre wt 95.2 kg Post wt 92.7 kg. Now under OP EDW. Lower EDW on DC.  4. Anemia - hgb 10.2 Follow trends. Not currently on ESA 5. Metabolic bone disease -Continue Hectorol/Auryxia binder. Phos 3.1 decrease binder dose.  No Parsabiv here. 6. Nutrition - Albumin 2.2 in setting of osteomyelitis.Renal diet/vitamins add prostat.  7. NICM EF 55-60% in 02/2018  8. PAD - s/p L tibial angioplasty/stenting 04/2018. Followed Dr. Scot Dock. On Plavix 9. DM T2  Rita H. Brown NP-C 11/20/2018, 10:30 AM  [facility] [phone]  Pt seen, examined and agree w A/P as above.  Kelly Splinter MD [employer] pager [phone]   11/20/2018, 12:08 PM

## 2018-11-20 NOTE — Consult Note (Signed)
Reason for Consult: Grossly infected left foot Referring Physician: Hospitalist  Zaedyn Covin. is an 67 y.o. male.  HPI: Patient is a 67 year old male well-known to me who has had multiple procedures for his left foot in the past.  He has been fighting osteomyelitis and wound issues for many years.  He presented septic on Friday and is ultimately been treated with reasonable results in terms of his sepsis but continues to have drainage from the foot.  MRI showed severe levels of osteomyelitis throughout the midfoot and even into the hindfoot.  My partner went by discuss his care with him but ultimately felt the below-knee amputation would be appropriate.  I saw this morning and we discussed this at length and I think this is the right choice for him.  Past Medical History:  Diagnosis Date  . Cardiomyopathy ([place])   . Cataract    right eye  . Diabetes mellitus    type 2  . Diabetic foot ulcer ([place])   . Diabetic infection of right foot ([place])   . Diabetic peripheral neuropathy ([place])   . ESRD (end stage renal disease) ([place])    m-w-f [facility] [address]  . Glaucoma    left eye  . Headache   . HTN (hypertension)   . Osteomyelitis ([place])   . Peripheral vascular disease ([place])    critical limb ischemia    Past Surgical History:  Procedure Laterality Date  . ABDOMINAL AORTOGRAM N/A 05/04/2018   Procedure: ABDOMINAL AORTOGRAM;  Surgeon: Conrad Covington, MD;  Location: [place] [facility];  Service: Cardiovascular;  Laterality: N/A;  . AMPUTATION TOE Right 01/21/2017   Procedure: AMPUTATION 2ND TOE RIGHT FOOT ;  Surgeon: Dorna Leitz, MD;  Location: [place];  Service: Orthopedics;  Laterality: Right;  . AMPUTATION TOE Left 05/18/2017   Procedure: 5TH RAY AMPUTATION  and EXCISION CUBOID;  Surgeon: Dorna Leitz, MD;  Location: [facility];  Service: Orthopedics;  Laterality: Left;  . AMPUTATION TOE Bilateral 01/27/2018   Procedure: RIGHT FOOT 3RD TOE AMPUTATION WITH INCISIONAL WOUND  DEBRIDEMENT. LEFT FOOT 2ND TOE AMPUTATION AND INCISIONAL WOUND DEBRIDEMENT.;  Surgeon: Dorna Leitz, MD;  [facility];  Service: Orthopedics;  Laterality: Bilateral;  . AV FISTULA PLACEMENT Left 11/27/2014   Procedure: ARTERIOVENOUS (AV) FISTULA CREATION;  Surgeon: Conrad Patterson, MD;  Location: [place];  Service: Vascular;  Laterality: Left;  . CHOLECYSTECTOMY    . COLONOSCOPY N/A 11/28/2014   Procedure: COLONOSCOPY;  Surgeon: Ladene Artist, MD;  Location: [facility]OS[facility];  Service: Endoscopy;  Laterality: N/A;  . ESOPHAGOGASTRODUODENOSCOPY N/A 11/28/2014   Procedure: ESOPHAGOGASTRODUODENOSCOPY (EGD);  Surgeon: Ladene Artist, MD;  Location: [facility] ENDOSCOPY;  Service: Endoscopy;  Laterality: N/A;  . EYE SURGERY     retina and cataract left eye surgery  . I&D EXTREMITY  01/28/2012   Procedure: IRRIGATION AND DEBRIDEMENT EXTREMITY;  Surgeon: Paulene Floor, MD;  [facility];  Service: Orthopedics;  Laterality: Left;  irrigation and drainage left hand  . I&D EXTREMITY  01/30/2012   Procedure: IRRIGATION AND DEBRIDEMENT EXTREMITY;  Surgeon: Paulene Floor, MD;  [facility];  Service: Orthopedics;  Laterality: Left;  flexor teno synovectomy and extensor tenosynovectomy arthrosynovectomy wristjoint  . I&D EXTREMITY  02/01/2012   Procedure: IRRIGATION AND DEBRIDEMENT EXTREMITY;  Surgeon: Paulene Floor, MD;  [facility];  Service: Orthopedics;  Laterality: Left;  Left Wrist, Hand and Forearm  . IRRIGATION AND DEBRIDEMENT FOOT Left 01/21/2017  Procedure: DEBRIDEMENT LEFT PLANTAR WOUND;  Surgeon: Dorna Leitz, MD;  Location: [place];  Service: Orthopedics;  Laterality: Left;  . Left 4th toe amputation    . LEFT AND RIGHT HEART CATHETERIZATION WITH CORONARY ANGIOGRAM N/A 11/26/2014   Procedure: LEFT AND RIGHT HEART CATHETERIZATION WITH CORONARY ANGIOGRAM;  Surgeon: Leonie Man, MD;  Location: [facility] CATH LAB;  Service: Cardiovascular;  Laterality: N/A;  . LOWER EXTREMITY ANGIOGRAPHY  Bilateral 05/04/2018   Procedure: Lower Extremity Angiography;  Surgeon: Conrad Reedsburg, MD;  Location: [place] [facility];  Service: Cardiovascular;  Laterality: Bilateral;  . LOWER EXTREMITY ANGIOGRAPHY Left 05/11/2018   Procedure: LOWER EXTREMITY ANGIOGRAPHY;  Surgeon: Conrad Iota, MD;  Location: [place] [facility];  Service: Cardiovascular;  Laterality: Left;  . PERIPHERAL VASCULAR BALLOON ANGIOPLASTY Left 05/11/2018   Procedure: PERIPHERAL VASCULAR BALLOON ANGIOPLASTY;  Surgeon: Conrad Callaway, MD;  Location: [place] [facility];  Service: Cardiovascular;  Laterality: Left;  Posterior tibial  . PERIPHERAL VASCULAR INTERVENTION Left 05/11/2018   Procedure: PERIPHERAL VASCULAR INTERVENTION;  Surgeon: Conrad Kerrick, MD;  Location: [place] [facility];  Service: Cardiovascular;  Laterality: Left;  tibioperoneal trunk  . TONSILLECTOMY      Family History  Problem Relation Age of Onset  . Heart disease Mother   . Diabetes Father   . Diabetes Brother   . Diabetes Sister   . Diabetes Brother   . Diabetes Brother     Social History:  reports that he has quit smoking. His smoking use included cigarettes. He has a 2.00 pack-year smoking history. He has never used smokeless tobacco. He reports that he does not drink alcohol or use drugs.  Allergies:  Allergies  Allergen Reactions  . Penicillins Hives, Swelling and Other (See Comments)    Ankle swelling Has patient had a PCN reaction causing immediate rash, facial/tongue/throat swelling, SOB or lightheadedness with hypotension: Yes Has patient had a PCN reaction causing severe rash involving mucus membranes or skin necrosis: No Has patient had a PCN reaction that required hospitalization: No Has patient had a PCN reaction occurring within the last 10 years: No If all of the above answers are "NO", then may proceed with Cephalosporin use.    Medications: I have reviewed the patient's current medications.  Results for orders placed or performed  during the hospital encounter of 11/17/18 (from the past 48 hour(s))  Glucose, capillary     Status: Abnormal   Collection Time: 11/18/18 12:04 PM  Result Value Ref Range   Glucose-Capillary 156 (H) 70 - 99 mg/dL  Glucose, capillary     Status: Abnormal   Collection Time: 11/18/18  5:22 PM  Result Value Ref Range   Glucose-Capillary 139 (H) 70 - 99 mg/dL  Glucose, capillary     Status: Abnormal   Collection Time: 11/18/18  8:48 PM  Result Value Ref Range   Glucose-Capillary 118 (H) 70 - 99 mg/dL  CBC     Status: Abnormal   Collection Time: 11/19/18  5:56 AM  Result Value Ref Range   WBC 7.7 4.0 - 10.5 K/uL   RBC 3.60 (L) 4.22 - 5.81 MIL/uL   Hemoglobin 10.2 (L) 13.0 - 17.0 g/dL   HCT 32.5 (L) 39.0 - 52.0 %   MCV 90.3 80.0 - 100.0 fL   MCH 28.3 26.0 - 34.0 pg   MCHC 31.4 30.0 - 36.0 g/dL   RDW 16.2 (H) 11.5 - 15.5 %   Platelets 245 150 - 400 K/uL  nRBC 0.0 0.0 - 0.2 %    Comment: Performed at [facility], [place] [address], [place], [place] [address]  Renal function panel     Status: Abnormal   Collection Time: 11/19/18  5:56 AM  Result Value Ref Range   Sodium 136 135 - 145 mmol/L   Potassium 4.0 3.5 - 5.1 mmol/L   Chloride 98 98 - 111 mmol/L   CO2 29 22 - 32 mmol/L   Glucose, Bld 189 (H) 70 - 99 mg/dL   BUN 23 8 - 23 mg/dL   Creatinine, Ser 7.42 (H) 0.61 - 1.24 mg/dL   Calcium 8.0 (L) 8.9 - 10.3 mg/dL   Phosphorus 3.1 2.5 - 4.6 mg/dL   Albumin 2.2 (L) 3.5 - 5.0 g/dL   GFR calc non Af Amer 7 (L) >60 mL/min   GFR calc Af Amer 8 (L) >60 mL/min    Comment: (NOTE) The eGFR has been calculated using the CKD EPI equation. This calculation has not been validated in all clinical situations. eGFR's persistently <60 mL/min signify possible Chronic Kidney Disease.    Anion gap 9 5 - 15    Comment: Performed at [facility] [address], [place], [place] [address]  Glucose, capillary     Status: Abnormal   Collection Time: 11/19/18 11:55 AM  Result Value Ref  Range   Glucose-Capillary 107 (H) 70 - 99 mg/dL  Glucose, capillary     Status: Abnormal   Collection Time: 11/19/18  5:02 PM  Result Value Ref Range   Glucose-Capillary 176 (H) 70 - 99 mg/dL  Surgical pcr screen     Status: Abnormal   Collection Time: 11/20/18  1:10 AM  Result Value Ref Range   MRSA, PCR NEGATIVE NEGATIVE   Staphylococcus aureus POSITIVE (A) NEGATIVE    Comment: RESULT CALLED TO, READ BACK BY AND VERIFIED WITH: RN T. Ellender Hose [phone] '@[id number]'  THANEY (NOTE) The Xpert SA Assay (FDA approved for NASAL specimens in patients 73 years of age and older), is one component of a comprehensive surveillance program. It is not intended to diagnose infection nor to guide or monitor treatment.   Hemoglobin A1c     Status: Abnormal   Collection Time: 11/20/18  4:49 AM  Result Value Ref Range   Hgb A1c MFr Bld 7.7 (H) 4.8 - 5.6 %    Comment: (NOTE) Pre diabetes:          5.7%-6.4% Diabetes:              >6.4% Glycemic control for   <7.0% adults with diabetes    Mean Plasma Glucose 174.29 mg/dL    Comment: Performed at [facility] [address], [place], [place] [address]  Vancomycin, random     Status: None   Collection Time: 11/20/18  4:49 AM  Result Value Ref Range   Vancomycin Rm 28     Comment:        Random Vancomycin therapeutic range is dependent on dosage and time of specimen collection. A peak range is 20.0-40.0 ug/mL A trough range is 5.0-15.0 ug/mL        Performed at [facility] [address] [place], [place] [address]   Urinalysis, Routine w reflex microscopic     Status: Abnormal   Collection Time: 11/20/18  5:45 AM  Result Value Ref Range   Color, Urine YELLOW YELLOW   APPearance CLEAR CLEAR   Specific Gravity, Urine 1.013 1.005 - 1.030  pH 9.0 (H) 5.0 - 8.0   Glucose, UA 50 (A) NEGATIVE mg/dL   Hgb urine dipstick NEGATIVE NEGATIVE   Bilirubin Urine NEGATIVE NEGATIVE   Ketones, ur NEGATIVE NEGATIVE mg/dL   Protein, ur 100 (A)  NEGATIVE mg/dL   Nitrite NEGATIVE NEGATIVE   Leukocytes, UA NEGATIVE NEGATIVE   RBC / HPF 0-5 0 - 5 RBC/hpf   WBC, UA 0-5 0 - 5 WBC/hpf   Bacteria, UA RARE (A) NONE SEEN   Squamous Epithelial / LPF 0-5 0 - 5    Comment: Performed at [facility], [place] [address], [place], [place] [address]  Glucose, capillary     Status: Abnormal   Collection Time: 11/20/18  7:55 AM  Result Value Ref Range   Glucose-Capillary 130 (H) 70 - 99 mg/dL    Mr Foot Right Wo Contrast  Result Date: 11/19/2018 CLINICAL DATA:  Right foot wound. Bacteremia. Concern for osteomyelitis. End-stage renal disease. EXAM: MRI OF THE RIGHT FOREFOOT WITHOUT CONTRAST TECHNIQUE: Multiplanar, multisequence MR imaging of the right forefoot was performed. No intravenous contrast was administered. COMPARISON:  Previous MRI 05/12/2018.  Radiographs 01/12/2018. FINDINGS: Bones/Joint/Cartilage Multiple prior amputations are again noted, including amputations of the 2nd and 4th toes, the distal 3rd toe and the distal 5th metatarsal. These all appear stable. There is new extensive signal abnormality traversing the base of the 3rd metatarsal, most consistent with a stress fracture. There is associated bone marrow and surrounding soft tissue edema as well as cortical thickening. The additional metatarsal bases are intact and normally aligned. There are stable moderate degenerative changes at the 1st metatarsophalangeal joint and it sesamoids. The tarsal bones appears stable. There are no significant joint effusions. Ligaments Intact Lisfranc ligament. Muscles and Tendons Mild muscular atrophy.  No significant tenosynovitis. Soft tissues As above, there is edema within the soft tissue surrounding the 3rd metatarsal base. The previously demonstrated soft tissue ulceration along the plantar aspect of the forefoot has improved. No focal fluid collection or generalized inflammation identified. IMPRESSION: 1. New abnormality involving the base of the  3rd metatarsal, most consistent with a stress fracture. 2. No evidence of recurrent osteomyelitis. 3. Previously demonstrated inflammatory changes involving the plantar forefoot have improved. No evidence of soft tissue abscess. 4. Multiple previous amputations. Chronic degenerative changes at the 1st metatarsophalangeal joint. Electronically Signed   By: Richardean Sale M.D.   On: 11/19/2018 16:49   Mr Foot Left Wo Contrast  Result Date: 11/19/2018 CLINICAL DATA:  Sepsis EXAM: MRI OF THE LEFT HINDFOOT WITHOUT CONTRAST Osteomyelitis. Bony destruction of the base of the fourth metatarsal compatible with osteomyelitis. TECHNIQUE: Multiplanar, multisequence MR imaging of the ankle was performed. No intravenous contrast was administered. COMPARISON:  08/31/2018 FINDINGS: TENDONS Peroneal: Prominent peroneus longus tendinopathy posterior to the lateral malleolus. Poor definition of the distal peroneus longus tendon with at least partial tearing. The peroneus brevis is discontinuous as expected given that the fifth digit is absent. Posteromedial: Distal tibialis posterior tendinopathy. Anterior: Unremarkable Achilles: Unremarkable Plantar Fascia: Thickened medial band plantar fascia. LIGAMENTS Lateral: Thickened anterior inferior tibiofibular ligament. Medial: Irregularity and likely erosion along the lateral malleolar attachment of the deep tibiotalar portion of the deltoid ligament. Thickened superomedial portion of the spring ligament. Thickened Lisfranc ligament. CARTILAGE Ankle Joint: Degenerative chondral thinning. Subtalar Joints/Sinus Tarsi: Degenerative chondral thinning. Bones: Significant progression of destructive osseous edema involving the cuboid and lateral cuneiform. Significant increase in edema suspicious for osteomyelitis in the middle cuneiform. Worsening of arthropathy at the articulation  between the navicular and medial cuneiform, osteomyelitis not excluded. There is abnormal edema in the bases  of the second and third metatarsals suspicious for osteomyelitis. Bony destruction of the base of the fourth metatarsal, compatible with osteomyelitis. Other: Subcutaneous edema along the lateral ankle and tracking especially in the plantar foot. Wound underneath the cuboid, likely ulceration. IMPRESSION: 1. Active osteomyelitis involving the cuboid, lateral cuneiform, and base of the fourth metatarsal. Probable osteomyelitis in the middle cuneiform and bases of the second and third metatarsals. Most of the edema at the articulation between the navicular and medial cuneiform is likely degenerative. 2. Suspected ulceration adjacent to the cuboid. 3. Absent fifth ray with discontinuous peroneus brevis tendon. Prominent thickening and irregularity and at least partial tearing of the peroneus longus. 4. Distal tibialis posterior tendinopathy. 5. Thickened medial band of the plantar fascia, possibly from plantar fasciitis. 6. Irregularity along the lateral malleolar attachment of the deep tibiotalar portion of the deltoid ligament. Thickened superomedial portion of the spring ligament. Electronically Signed   By: Van Clines M.D.   On: 11/19/2018 21:38    ROS  ROS: I have reviewed the patient's review of systems thoroughly and there are no positive responses as relates to the HPI. Blood pressure 139/70, pulse 76, temperature 99.1 F (37.3 C), temperature source Oral, resp. rate 18, height '6\' 1"'  (1.854 m), weight 92.7 kg, SpO2 98 %. Physical Exam Well-developed well-nourished patient in no acute distress. Alert and oriented x3 HEENT:within normal limits Cardiac: Regular rate and rhythm Pulmonary: Lungs clear to auscultation Abdomen: Soft and nontender.  Normal active bowel sounds  Musculoskeletal: (Left foot: Large open draining wound on the plantar and lateral aspect of the foot.  There is pus draining out of the wound.  There is no erythema or warmth but just large open area pus  draining) Assessment/Plan: 67 year old male with a long history of wound issues and osteomyelitis of the left foot.  He has been treated for multiple episodes of IV antibiotics.  He has currently presented septic with severe osteomyelitis of the left foot and multiple areas.//With a long discussion of treatment options but at this point I think that amputation below knee is the only reasonable treatment option.  I discussed this at length with the patient and his family they understand the risks including bleeding, infection, need for further surgery, and failure of the wound to heal.  They also there is a slight chance of death and around the time of surgery.  They do wish to proceed with the surgical intervention at this point.  Will be performed sometime later today.  [name] 11/20/2018, 9:46 AM

## 2018-11-20 NOTE — Anesthesia Preprocedure Evaluation (Signed)
Anesthesia Evaluation  Patient identified by MRN, date of birth, ID band Patient awake    Reviewed: Allergy & Precautions, NPO status , Patient's Chart, lab work & pertinent test results, reviewed documented beta blocker date and time   History of Anesthesia Complications Negative for: history of anesthetic complications  Airway Mallampati: II  TM Distance: >3 FB Neck ROM: Full    Dental  (+) Poor Dentition, Chipped, Missing, Dental Advisory Given   Pulmonary neg shortness of breath, neg sleep apnea, neg COPD, neg recent URI, former smoker,    breath sounds clear to auscultation       Cardiovascular hypertension, Pt. on medications and Pt. on home beta blockers (-) angina+ Peripheral Vascular Disease  (-) Past MI and (-) CHF  Rhythm:Regular     Neuro/Psych  Headaches, neg Seizures  Neuromuscular disease negative psych ROS   GI/Hepatic negative GI ROS, Neg liver ROS,   Endo/Other  diabetes, Type 2, Insulin Dependent  Renal/GU ESRF and DialysisRenal diseaseHD this morning      Musculoskeletal   Abdominal   Peds  Hematology  (+) anemia ,   Anesthesia Other Findings Result status: Final result                             *[place]*                   *[facility]*                         [address], [place] [address]                            [phone]  ------------------------------------------------------------------- Transthoracic Echocardiography  Patient:    [name], [name] MR #:       [id number] Study Date: 11/20/2018 Gender:     M Age:        67 Height:     185.4 cm Weight:     92.7 kg BSA:        2.2 m^2 Pt. Status: Room:       5M10C   ATTENDING    [name]     [name], [name]  REFERRING    [name], [name]  PERFORMING   [name], Inpatient  SONOGRAPHER  [name], RDCS  ADMITTING    [name]  cc:  ------------------------------------------------------------------- LV EF: 60% -   65%  ------------------------------------------------------------------- Indications:      Bacteremia 790.7.  ------------------------------------------------------------------- History:   PMH:  Sepsis. Peripheral Vascular Disease. Cardiomyopathy. End Stage Renal Disease.  Risk factors: Hypertension. Diabetes mellitus.  ------------------------------------------------------------------- Study Conclusions  - Left ventricle: The cavity size was normal. There was severe   focal basal hypertrophy of the septum (17 mm). Systolic function   was normal. The estimated ejection fraction was in the range of   60% to 65%. Wall motion was normal; there were no regional wall   motion abnormalities. Doppler parameters are consistent with   abnormal left ventricular relaxation (grade 1 diastolic   dysfunction). Doppler parameters are consistent with high   ventricular filling pressure. - Aortic valve: Trileaflet; normal thickness leaflets.   Transvalvular velocity was within the normal range. There was  no   stenosis. There was no regurgitation. - Aortic root: The aortic root was normal in size. 41 mm in   approximate dimension, when indexed to BSA and compared to age,   this may be within the normal range for a male. - Mitral valve: Calcified annulus. Moderately thickened leaflets .   Transvalvular velocity was within the normal range. There was no   evidence for stenosis. There was trivial regurgitation. - Left atrium: The atrium was mildly dilated. - Right ventricle: The cavity size was normal. Wall thickness was   normal. Systolic function was normal. - Right atrium: The atrium was normal in size. - Tricuspid valve: There was trivial regurgitation. - Pulmonic     Reproductive/Obstetrics                             Anesthesia Physical  Anesthesia  Plan  ASA: III  Anesthesia Plan: General   Post-op Pain Management:    Induction: Intravenous  PONV Risk Score and Plan: 2 and Ondansetron and Treatment may vary due to age or medical condition  Airway Management Planned: Oral ETT  Additional Equipment: None  Intra-op Plan:   Post-operative Plan: Extubation in OR  Informed Consent: I have reviewed the patients History and Physical, chart, labs and discussed the procedure including the risks, benefits and alternatives for the proposed anesthesia with the patient or authorized representative who has indicated his/her understanding and acceptance.   Dental advisory given  Plan Discussed with: CRNA and Surgeon  Anesthesia Plan Comments:         Anesthesia Quick Evaluation

## 2018-11-20 NOTE — Progress Notes (Signed)
Patient returned from the O.R. Patient is alert and oriented x 4. Denies pain at this time but does report that he feels like he can "still feel his ankle". Will continue to monitor closely. Dorthey Sawyer, RN

## 2018-11-20 NOTE — Progress Notes (Signed)
    [name] has been requested to perform a transesophageal echocardiogram on [name]  for Bacteremia.  After careful review of history and examination, the risks and benefits of transesophageal echocardiogram have been explained including risks of esophageal damage, perforation (1:10,000 risk), bleeding, pharyngeal hematoma as well as other potential complications associated with conscious sedation including aspiration, arrhythmia, respiratory failure and death. Alternatives to treatment were discussed, questions were answered. Patient is willing to proceed.  TEE - Dr. Meda Coffee today in [place] @ 1400 . NPO. Meds with sips.   Leanor Kail, PA-C 11/20/2018 11:04 AM

## 2018-11-20 NOTE — Brief Op Note (Signed)
11/20/2018  10:28 PM  PATIENT:  [name] Sages Sr.  67 y.o. male  PRE-OPERATIVE DIAGNOSIS:  Osteomyelitis Left Foot  POST-OPERATIVE DIAGNOSIS:  Osteomyelitis Left Foot  PROCEDURE:  Procedure(s): AMPUTATION BELOW KNEE (Left) TRANSESOPHAGEAL ECHOCARDIOGRAM (TEE) (N/A)  SURGEON:  Surgeon(s) and Role: Panel 1:    Dorna Leitz, MD - Primary Panel 2:    * Jerline Pain, MD - Primary  PHYSICIAN ASSISTANT:   ASSISTANTS: bethune   ANESTHESIA:   general  EBL:  100 mL   BLOOD ADMINISTERED:none  DRAINS: Penrose drain in the medial and lateral amputation site   LOCAL MEDICATIONS USED:  NONE  SPECIMEN:  Source of Specimen:  l lower leg  DISPOSITION OF SPECIMEN:  PATHOLOGY  COUNTS:  YES  TOURNIQUET:   Total Tourniquet Time Documented: Thigh (Left) - 30 minutes Total: Thigh (Left) - 30 minutes   DICTATION: .Other Dictation: Dictation Number [id number]  PLAN OF CARE: Admit to inpatient   PATIENT DISPOSITION:  PACU - hemodynamically stable.   Delay start of Pharmacological VTE agent (>24hrs) due to surgical blood loss or risk of bleeding: no

## 2018-11-20 NOTE — CV Procedure (Signed)
   Transesophageal Echocardiogram  Indications: Bacteremia  Time out performed  Anesthesia - general  Findings:  Left Ventricle: EF 55%  Mitral Valve: Trivial MR  Aortic Valve: Minimal leaflet tip calcification  Tricuspid Valve: Mild TR  Left Atrium: normal, no thrombus  Impression: NO VEGETATION. Reassuring  Candee Furbish, MD

## 2018-11-20 NOTE — Progress Notes (Signed)
Report called to the O.R. Recheck of CBG = 101. Patient transferred to the O.R. Dorthey Sawyer, RN

## 2018-11-20 NOTE — Transfer of Care (Signed)
Immediate Anesthesia Transfer of Care Note  Patient: [name] FORNWALT Sr.  Procedure(s) Performed: AMPUTATION BELOW KNEE (Left Leg Lower) TRANSESOPHAGEAL ECHOCARDIOGRAM (TEE) (N/A )  Patient Location: PACU  Anesthesia Type:General  Level of Consciousness: awake, drowsy and patient cooperative  Airway & Oxygen Therapy: Patient Spontanous Breathing  Post-op Assessment: Report given to RN and Post -op Vital signs reviewed and stable  Post vital signs: Reviewed and stable  Last Vitals:  Vitals Value Taken Time  BP 128/83 11/20/2018  5:13 PM  Temp    Pulse 75 11/20/2018  5:17 PM  Resp 16 11/20/2018  5:17 PM  SpO2 98 % 11/20/2018  5:17 PM  Vitals shown include unvalidated device data.  Last Pain:  Vitals:   11/20/18 1716  TempSrc:   PainSc: (P) 8          Complications: No apparent anesthesia complications

## 2018-11-20 NOTE — Progress Notes (Signed)
Pre-op vancomycin:  Pt had a random vanc of 28 this AM. He will not need any pre-op dose of vanc. We will dc that order.  Plan  Dc pre-op vanc   Onnie Boer, PharmD, BCIDP, AAHIVP, CPP Infectious Disease Pharmacist 11/20/2018 2:41 PM

## 2018-11-21 ENCOUNTER — Encounter (HOSPITAL_COMMUNITY): Payer: Self-pay | Admitting: Orthopedic Surgery

## 2018-11-21 ENCOUNTER — Ambulatory Visit: Payer: Medicare Other | Admitting: Infectious Diseases

## 2018-11-21 DIAGNOSIS — D72829 Elevated white blood cell count, unspecified: Secondary | ICD-10-CM

## 2018-11-21 DIAGNOSIS — L97529 Non-pressure chronic ulcer of other part of left foot with unspecified severity: Secondary | ICD-10-CM

## 2018-11-21 DIAGNOSIS — M86472 Chronic osteomyelitis with draining sinus, left ankle and foot: Secondary | ICD-10-CM

## 2018-11-21 DIAGNOSIS — R7881 Bacteremia: Secondary | ICD-10-CM

## 2018-11-21 DIAGNOSIS — I10 Essential (primary) hypertension: Secondary | ICD-10-CM

## 2018-11-21 DIAGNOSIS — G8918 Other acute postprocedural pain: Secondary | ICD-10-CM

## 2018-11-21 DIAGNOSIS — Z720 Tobacco use: Secondary | ICD-10-CM

## 2018-11-21 DIAGNOSIS — E871 Hypo-osmolality and hyponatremia: Secondary | ICD-10-CM

## 2018-11-21 LAB — GLUCOSE, CAPILLARY
GLUCOSE-CAPILLARY: 188 mg/dL — AB (ref 70–99)
Glucose-Capillary: 239 mg/dL — ABNORMAL HIGH (ref 70–99)
Glucose-Capillary: 162 mg/dL — ABNORMAL HIGH (ref 70–99)
Glucose-Capillary: 195 mg/dL — ABNORMAL HIGH (ref 70–99)

## 2018-11-21 LAB — BASIC METABOLIC PANEL
ANION GAP: 10 (ref 5–15)
BUN: 36 mg/dL — ABNORMAL HIGH (ref 8–23)
CALCIUM: 8.2 mg/dL — AB (ref 8.9–10.3)
CO2: 25 mmol/L (ref 22–32)
Chloride: 97 mmol/L — ABNORMAL LOW (ref 98–111)
Creatinine, Ser: 8.13 mg/dL — ABNORMAL HIGH (ref 0.61–1.24)
GFR, EST AFRICAN AMERICAN: 7 mL/min — AB (ref >60)
GFR, EST NON AFRICAN AMERICAN: 6 mL/min — AB (ref >60)
Glucose, Bld: 255 mg/dL — ABNORMAL HIGH (ref 70–99)
Potassium: 5.1 mmol/L (ref 3.5–5.1)
SODIUM: 132 mmol/L — AB (ref 135–145)

## 2018-11-21 LAB — CBC
HCT: 34.8 % — ABNORMAL LOW (ref 39.0–52.0)
Hemoglobin: 10.6 g/dL — ABNORMAL LOW (ref 13.0–17.0)
MCH: 27.6 pg (ref 26.0–34.0)
MCHC: 30.5 g/dL (ref 30.0–36.0)
MCV: 90.6 fL (ref 80.0–100.0)
NRBC: 0 % (ref 0.0–0.2)
PLATELETS: 331 10*3/uL (ref 150–400)
RBC: 3.84 MIL/uL — AB (ref 4.22–5.81)
RDW: 16.4 % — ABNORMAL HIGH (ref 11.5–15.5)
WBC: 11.8 10*3/uL — AB (ref 4.0–10.5)

## 2018-11-21 LAB — VANCOMYCIN, RANDOM: VANCOMYCIN RM: 38

## 2018-11-21 MED ORDER — SENNOSIDES-DOCUSATE SODIUM 8.6-50 MG PO TABS
2.0000 | ORAL_TABLET | Freq: Two times a day (BID) | ORAL | Status: DC
  Administered 2018-11-21 – 2018-11-22 (×2): 2 via ORAL
  Filled 2018-11-21 (×2): qty 2

## 2018-11-21 MED ORDER — POLYETHYLENE GLYCOL 3350 17 G PO PACK: 17.0000 g | PACK | Freq: Every day | ORAL | Status: DC | PRN

## 2018-11-21 NOTE — Consult Note (Signed)
Physical Medicine and Rehabilitation Consult Reason for Consult:  Decreased functional mobility Referring Physician: [facility]   HPI: [name] VANEATON Sr. is a 67 y.o. right handed male with history of cardiomyopathy, diabetes mellitus, end-stage renal disease with hemodialysis, hypertension, tobacco abuse,peripheral vascular disease with right second and third toe amputation as well as left second toe amputation. Per chart review, patient, and wife, he lives with spouse. Used assistive device prior to admission. 3 level home with bedroom upstairs. Wife works during the day. Presented  11/17/2018 with bilateral chronic foot ulcers. Patient does attend the wound clinic. Recent wound cultures staph MRSA started on vancomycin. MRI of left foot showed extensive osteomyelitis. Limb was not felt to be salvageable. 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 Dorna Leitz. Hospital course pain management. Hemodialysis ongoing as per renal services. Therapy evaluations completed. M.D. Has requested physical medicine rehabilitation consult.   Review of Systems  HENT: Negative for hearing loss.   Eyes: Negative for blurred vision and double vision.  Respiratory: Negative for cough and shortness of breath.   Cardiovascular: Positive for leg swelling. Negative for chest pain and palpitations.  Gastrointestinal: Positive for constipation. Negative for nausea and vomiting.  Genitourinary: Negative for dysuria and hematuria.  Musculoskeletal: Positive for joint pain and myalgias.  Skin: Negative for rash.  Neurological: Positive for sensory change (Baseline left hand numbness), focal weakness and headaches.  All other systems reviewed and are negative.  Past Medical History:  Diagnosis Date  . Cardiomyopathy ([place])   . Cataract    right eye  . Diabetes mellitus    type 2  . Diabetic foot ulcer ([place])   . Diabetic infection of right foot ([place])   . Diabetic  peripheral neuropathy ([place])   . ESRD (end stage renal disease) ([place])    m-w-f [facility] [address]  . Glaucoma    left eye  . Headache   . HTN (hypertension)   . Osteomyelitis ([place])   . Peripheral vascular disease ([place])    critical limb ischemia   Past Surgical History:  Procedure Laterality Date  . ABDOMINAL AORTOGRAM N/A 05/04/2018   Procedure: ABDOMINAL AORTOGRAM;  Surgeon: Conrad West Bishop, MD;  Location: [facility];  Service: Cardiovascular;  Laterality: N/A;  . AMPUTATION TOE Right 01/21/2017   Procedure: AMPUTATION 2ND TOE RIGHT FOOT ;  Surgeon: Dorna Leitz, MD;  Location: [facility];  Service: Orthopedics;  Laterality: Right;  . AMPUTATION TOE Left 05/18/2017   Procedure: 5TH RAY AMPUTATION  and EXCISION CUBOID;  Surgeon: Dorna Leitz, MD;  Location: [facility];  Service: Orthopedics;  Laterality: Left;  . AMPUTATION TOE Bilateral 01/27/2018   Procedure: RIGHT FOOT 3RD TOE AMPUTATION WITH INCISIONAL WOUND DEBRIDEMENT. LEFT FOOT 2ND TOE AMPUTATION AND INCISIONAL WOUND DEBRIDEMENT.;  Surgeon: Dorna Leitz, MD;  Location: [facility];  Service: Orthopedics;  Laterality: Bilateral;  . AV FISTULA PLACEMENT Left 11/27/2014   Procedure: ARTERIOVENOUS (AV) FISTULA CREATION;  Surgeon: Conrad De Witt, MD;  Location: [facility];  Service: Vascular;  Laterality: Left;  . CHOLECYSTECTOMY    . COLONOSCOPY N/A 11/28/2014   Procedure: COLONOSCOPY;  Surgeon: Ladene Artist, MD;  Location: [facility];  Service: Endoscopy;  Laterality: N/A;  . ESOPHAGOGASTRODUODENOSCOPY N/A 11/28/2014   Procedure: ESOPHAGOGASTRODUODENOSCOPY (EGD);  Surgeon: Ladene Artist, MD;  Location: [facility];  Service: Endoscopy;  Laterality: N/A;  . EYE SURGERY     retina and cataract left eye surgery  .  I&D EXTREMITY  01/28/2012   Procedure: IRRIGATION AND DEBRIDEMENT EXTREMITY;  Surgeon: Paulene Floor, MD;  Location: [facility];  Service: Orthopedics;  Laterality: Left;  irrigation and drainage left hand  . I&D EXTREMITY   01/30/2012   Procedure: IRRIGATION AND DEBRIDEMENT EXTREMITY;  Surgeon: Paulene Floor, MD;  Location: [facility];  Service: Orthopedics;  Laterality: Left;  flexor teno synovectomy and extensor tenosynovectomy arthrosynovectomy wristjoint  . I&D EXTREMITY  02/01/2012   Procedure: IRRIGATION AND DEBRIDEMENT EXTREMITY;  Surgeon: Paulene Floor, MD;  Location: [facility];  Service: Orthopedics;  Laterality: Left;  Left Wrist, Hand and Forearm  . IRRIGATION AND DEBRIDEMENT FOOT Left 01/21/2017   Procedure: DEBRIDEMENT LEFT PLANTAR WOUND;  Surgeon: Dorna Leitz, MD;  Location: [place];  Service: Orthopedics;  Laterality: Left;  . Left 4th toe amputation    . LEFT AND RIGHT HEART CATHETERIZATION WITH CORONARY ANGIOGRAM N/A 11/26/2014   Procedure: LEFT AND RIGHT HEART CATHETERIZATION WITH CORONARY ANGIOGRAM;  Surgeon: Leonie Man, MD;  Location: [facility];  Service: Cardiovascular;  Laterality: N/A;  . LOWER EXTREMITY ANGIOGRAPHY Bilateral 05/04/2018   Procedure: Lower Extremity Angiography;  Surgeon: Conrad [place]field, MD;  Location: [place] [facility];  Service: Cardiovascular;  Laterality: Bilateral;  . LOWER EXTREMITY ANGIOGRAPHY Left 05/11/2018   Procedure: LOWER EXTREMITY ANGIOGRAPHY;  Surgeon: Conrad [place]town, MD;  Location: [facility];  Service: Cardiovascular;  Laterality: Left;  . PERIPHERAL VASCULAR BALLOON ANGIOPLASTY Left 05/11/2018   Procedure: PERIPHERAL VASCULAR BALLOON ANGIOPLASTY;  Surgeon: Conrad Mossyrock, MD;  Location: [facility];  Service: Cardiovascular;  Laterality: Left;  Posterior tibial  . PERIPHERAL VASCULAR INTERVENTION Left 05/11/2018   Procedure: PERIPHERAL VASCULAR INTERVENTION;  Surgeon: Conrad [place], MD;  Location: [place] [facility];  Service: Cardiovascular;  Laterality: Left;  tibioperoneal trunk  . TONSILLECTOMY     Family History  Problem Relation Age of Onset  . Heart disease Mother   . Diabetes Father   . Diabetes Brother   . Diabetes Sister   .  Diabetes Brother   . Diabetes Brother    Social History:  reports that he has quit smoking. His smoking use included cigarettes. He has a 2.00 pack-year smoking history. He has never used smokeless tobacco. He reports that he does not drink alcohol or use drugs. Allergies:  Allergies  Allergen Reactions  . Penicillins Hives, Swelling and Other (See Comments)    Ankle swelling Has patient had a PCN reaction causing immediate rash, facial/tongue/throat swelling, SOB or lightheadedness with hypotension: Yes Has patient had a PCN reaction causing severe rash involving mucus membranes or skin necrosis: No Has patient had a PCN reaction that required hospitalization: No Has patient had a PCN reaction occurring within the last 10 years: No If all of the above answers are "NO", then may proceed with Cephalosporin use.   Medications Prior to Admission  Medication Sig Dispense Refill  . acetaminophen (TYLENOL) 325 MG tablet Take 650 mg by mouth every 6 (six) hours as needed (pain).     Marland Kitchen atorvastatin (LIPITOR) 40 MG tablet Take 40 mg by mouth at bedtime.     . ciprofloxacin (CIPRO) 500 MG tablet Take 1 tablet (500 mg total) by mouth daily. 42 tablet 1  . clopidogrel (PLAVIX) 75 MG tablet Take 1 tablet (75 mg total) by mouth daily. (Patient taking differently: Take 75 mg by mouth at bedtime. ) 30 tablet 11  . ferric citrate (  AURYXIA) 1 GM 210 MG(Fe) tablet Take 630 mg by mouth 3 (three) times daily with meals.     Marland Kitchen ibuprofen (ADVIL,MOTRIN) 200 MG tablet Take 400 mg by mouth daily as needed for headache (pain).     . insulin NPH-regular Human (NOVOLIN 70/30) (70-30) 100 UNIT/ML injection Inject 5-6 Units into the skin 2 (two) times daily with a meal. Sliding Scale - based on CBG    . midodrine (PROAMATINE) 5 MG tablet Take 10 mg by mouth 2 (two) times daily with a meal.  11  . Multiple Vitamins-Minerals (MULTIVITAMIN WITH MINERALS) tablet Take 1 tablet by mouth daily with supper.     . mupirocin  ointment (BACTROBAN) 2 % Apply 1 application topically daily as needed (rassh).   0  . primidone (MYSOLINE) 50 MG tablet Take 0.5 tablets (25 mg total) by mouth at bedtime. (Patient taking differently: Take 50 mg by mouth at bedtime. ) 30 tablet 0  . timolol (TIMOPTIC) 0.5 % ophthalmic solution Place 1 drop into the left eye 2 (two) times daily.    Marland Kitchen glucose monitoring kit (FREESTYLE) monitoring kit 1 each by Does not apply route 4 (four) times daily - after meals and at bedtime. 1 month Diabetic Testing Supplies for QAC-QHS accuchecks.Any brand OK 1 each 1  . HYDROcodone-acetaminophen (NORCO/VICODIN) 5-325 MG tablet Take 1 tablet by mouth every 6 (six) hours as needed for moderate pain. (Patient not taking: Reported on 11/17/2018) 30 tablet 0  . Insulin Syringe-Needle U-100 25G X 1" 1 ML MISC Any brand, for 4 times a day insulin SQ, 1 month supply. 30 each 0  . midodrine (PROAMATINE) 10 MG tablet Take 1 tablet (10 mg total) by mouth 3 (three) times daily with meals. (Patient not taking: Reported on 11/17/2018) 90 tablet 6    Home: Home Living Living Arrangements: Spouse/significant other  Functional History:   Functional Status:  Mobility:          ADL:    Cognition: Cognition Orientation Level: Oriented X4    Blood pressure (!) 158/93, pulse 78, temperature 98.4 F (36.9 C), temperature source Oral, resp. rate 18, height '6\' 1"'  (1.854 m), weight 92.7 kg, SpO2 100 %. Physical Exam  Vitals reviewed. Constitutional: He is oriented to person, place, and time. He appears well-developed and well-nourished.  HENT:  Head: Normocephalic and atraumatic.  Poor dentition  Eyes: EOM are normal. Right eye exhibits no discharge. Left eye exhibits no discharge.  Neck: Normal range of motion. Neck supple. No thyromegaly present.  Cardiovascular: Normal rate and regular rhythm.  Respiratory: Effort normal and breath sounds normal. No respiratory distress.  GI: Soft. Bowel sounds are normal.  He exhibits no distension.  Musculoskeletal:  Left stump edema and tenderness  Neurological: He is alert and oriented to person, place, and time.  Motor: RUE: 5/5 proximal to distal RLE: 4+/5 proximal to distal LUE: 4/5 proximal to distal (baseline) LUE tremor LLE: HF 4+/5 Sensation diminished to light touch left hand  Skin:  Left BKA site is dressed appropriately tender.  Psychiatric: He has a normal mood and affect. His behavior is normal.    Results for orders placed or performed during the hospital encounter of 11/17/18 (from the past 24 hour(s))  Glucose, capillary     Status: Abnormal   Collection Time: 11/20/18  7:55 AM  Result Value Ref Range   Glucose-Capillary 130 (H) 70 - 99 mg/dL  Glucose, capillary     Status: Abnormal   Collection Time: 11/20/18  11:39 AM  Result Value Ref Range   Glucose-Capillary 133 (H) 70 - 99 mg/dL  I-STAT 4, (NA,K, GLUC, HGB,HCT)     Status: Abnormal   Collection Time: 11/20/18  3:15 PM  Result Value Ref Range   Sodium 137 135 - 145 mmol/L   Potassium 4.6 3.5 - 5.1 mmol/L   Glucose, Bld 109 (H) 70 - 99 mg/dL   HCT 35.0 (L) 39.0 - 52.0 %   Hemoglobin 11.9 (L) 13.0 - 17.0 g/dL  Glucose, capillary     Status: Abnormal   Collection Time: 11/20/18  5:14 PM  Result Value Ref Range   Glucose-Capillary 130 (H) 70 - 99 mg/dL   Comment 1 Notify RN    Comment 2 Document in Chart   Glucose, capillary     Status: Abnormal   Collection Time: 11/20/18  6:09 PM  Result Value Ref Range   Glucose-Capillary 158 (H) 70 - 99 mg/dL  Glucose, capillary     Status: Abnormal   Collection Time: 11/20/18  9:57 PM  Result Value Ref Range   Glucose-Capillary 189 (H) 70 - 99 mg/dL   Mr Foot Right Wo Contrast  Result Date: 11/19/2018 CLINICAL DATA:  Right foot wound. Bacteremia. Concern for osteomyelitis. End-stage renal disease. EXAM: MRI OF THE RIGHT FOREFOOT WITHOUT CONTRAST TECHNIQUE: Multiplanar, multisequence MR imaging of the right forefoot was performed.  No intravenous contrast was administered. COMPARISON:  Previous MRI 05/12/2018.  Radiographs 01/12/2018. FINDINGS: Bones/Joint/Cartilage Multiple prior amputations are again noted, including amputations of the 2nd and 4th toes, the distal 3rd toe and the distal 5th metatarsal. These all appear stable. There is new extensive signal abnormality traversing the base of the 3rd metatarsal, most consistent with a stress fracture. There is associated bone marrow and surrounding soft tissue edema as well as cortical thickening. The additional metatarsal bases are intact and normally aligned. There are stable moderate degenerative changes at the 1st metatarsophalangeal joint and it sesamoids. The tarsal bones appears stable. There are no significant joint effusions. Ligaments Intact Lisfranc ligament. Muscles and Tendons Mild muscular atrophy.  No significant tenosynovitis. Soft tissues As above, there is edema within the soft tissue surrounding the 3rd metatarsal base. The previously demonstrated soft tissue ulceration along the plantar aspect of the forefoot has improved. No focal fluid collection or generalized inflammation identified. IMPRESSION: 1. New abnormality involving the base of the 3rd metatarsal, most consistent with a stress fracture. 2. No evidence of recurrent osteomyelitis. 3. Previously demonstrated inflammatory changes involving the plantar forefoot have improved. No evidence of soft tissue abscess. 4. Multiple previous amputations. Chronic degenerative changes at the 1st metatarsophalangeal joint. Electronically Signed   By: Richardean Sale M.D.   On: 11/19/2018 16:49   Mr Foot Left Wo Contrast  Result Date: 11/19/2018 CLINICAL DATA:  Sepsis EXAM: MRI OF THE LEFT HINDFOOT WITHOUT CONTRAST Osteomyelitis. Bony destruction of the base of the fourth metatarsal compatible with osteomyelitis. TECHNIQUE: Multiplanar, multisequence MR imaging of the ankle was performed. No intravenous contrast was  administered. COMPARISON:  08/31/2018 FINDINGS: TENDONS Peroneal: Prominent peroneus longus tendinopathy posterior to the lateral malleolus. Poor definition of the distal peroneus longus tendon with at least partial tearing. The peroneus brevis is discontinuous as expected given that the fifth digit is absent. Posteromedial: Distal tibialis posterior tendinopathy. Anterior: Unremarkable Achilles: Unremarkable Plantar Fascia: Thickened medial band plantar fascia. LIGAMENTS Lateral: Thickened anterior inferior tibiofibular ligament. Medial: Irregularity and likely erosion along the lateral malleolar attachment of the deep tibiotalar portion of the  deltoid ligament. Thickened superomedial portion of the spring ligament. Thickened Lisfranc ligament. CARTILAGE Ankle Joint: Degenerative chondral thinning. Subtalar Joints/Sinus Tarsi: Degenerative chondral thinning. Bones: Significant progression of destructive osseous edema involving the cuboid and lateral cuneiform. Significant increase in edema suspicious for osteomyelitis in the middle cuneiform. Worsening of arthropathy at the articulation between the navicular and medial cuneiform, osteomyelitis not excluded. There is abnormal edema in the bases of the second and third metatarsals suspicious for osteomyelitis. Bony destruction of the base of the fourth metatarsal, compatible with osteomyelitis. Other: Subcutaneous edema along the lateral ankle and tracking especially in the plantar foot. Wound underneath the cuboid, likely ulceration. IMPRESSION: 1. Active osteomyelitis involving the cuboid, lateral cuneiform, and base of the fourth metatarsal. Probable osteomyelitis in the middle cuneiform and bases of the second and third metatarsals. Most of the edema at the articulation between the navicular and medial cuneiform is likely degenerative. 2. Suspected ulceration adjacent to the cuboid. 3. Absent fifth ray with discontinuous peroneus brevis tendon. Prominent  thickening and irregularity and at least partial tearing of the peroneus longus. 4. Distal tibialis posterior tendinopathy. 5. Thickened medial band of the plantar fascia, possibly from plantar fasciitis. 6. Irregularity along the lateral malleolar attachment of the deep tibiotalar portion of the deltoid ligament. Thickened superomedial portion of the spring ligament. Electronically Signed   By: Van Clines M.D.   On: 11/19/2018 21:38    Assessment/Plan: Diagnosis: Left BKA Labs independently reviewed.  Records reviewed and summated above. Clean amputation daily with soap and water Monitor incision site for signs of infection or impending skin breakdown. Staples to remain in place for 3-4 weeks Stump shrinker, for edema control  Scar mobilization massaging to prevent soft tissue adherence Stump protector during therapies Prevent flexion contractures by implementing the following:   Encourage prone lying for 20-30 mins per day BID to avoid hip flexion  Contractures if medically appropriate;  Avoid pillow under knees when patient is lying in bed in order to prevent both  knee and hip flexion contractures;  Avoid prolonged sitting Post surgical pain control with oral medication Phantom limb pain control with physical modalities including desensitization techniques (gentle self massage to the residual stump,hot packs if sensation intact, [place]) and mirror therapy, TENS. If ineffective, consider pharmacological treatment for neuropathic pain (e.g gabapentin, pregabalin, amytriptalyine, duloxetine).  When using wheelchair, patient should have knee on amputated side fully extended with board under the seat cushion. Avoid injury to contralateral side  1. Does the need for close, 24 hr/day medical supervision in concert with the patient's rehab needs make it unreasonable for this patient to be served in a less intensive setting? Yes  2. Co-Morbidities requiring supervision/potential complications:  post-op pain management (Biofeedback training with therapies to help reduce reliance on opiate pain medications, monitor pain control during therapies, and sedation at rest and titrate to maximum efficacy to ensure participation and gains in therapies), cardiomyopathy, DM (Monitor in accordance with exercise and adjust meds as necessary), end-stage renal disease (recs per Nephro), HTN (monitor and provide prns in accordance with increased physical exertion and pain), tobacco abuse (counsel), peripheral vascular disease, with right second and third toe amputation, hyponatremia (cont to monitor, treat if necessary), leukocytosis (repeat labs, cont to monitor for signs and symptoms of infection, further workup if indicated)  3. Due to safety, skin/wound care, disease management, pain management and patient education, does the patient require 24 hr/day rehab nursing? Yes 4. Does the patient require coordinated care of a physician, rehab  nurse, PT (1-2 hrs/day, 5 days/week) and OT (1-2 hrs/day, 5 days/week) to address physical and functional deficits in the context of the above medical diagnosis(es)? Yes Addressing deficits in the following areas: balance, endurance, locomotion, strength, transferring, bathing, dressing, toileting and psychosocial support 5. Can the patient actively participate in an intensive therapy program of at least 3 hrs of therapy per day at least 5 days per week? Yes 6. The potential for patient to make measurable gains while on inpatient rehab is excellent 7. Anticipated functional outcomes upon discharge from inpatient rehab are TBD  with PT, supervision and min assist with OT, n/a with SLP. 8. Estimated rehab length of stay to reach the above functional goals is: 7-10 days. 9. Anticipated D/C setting: Home 10. Anticipated post D/C treatments: HH therapy and Home excercise program 11. Overall Rehab/Functional Prognosis: good  RECOMMENDATIONS: This patient's condition is appropriate  for continued rehabilitative care in the following setting: CIR when medically appropriate Patient has agreed to participate in recommended program. Yes Note that insurance prior authorization may be required for reimbursement for recommended care.  Comment: Rehab Admissions Coordinator to follow up.   I have personally performed a face to face diagnostic evaluation, including, but not limited to relevant history and physical exam findings, of this patient and developed relevant assessment and plan.  Additionally, I have reviewed and concur with the physician assistant's documentation above.   Delice Lesch, MD, ABPMR Lavon Paganini Angiulli, PA-C 11/21/2018

## 2018-11-21 NOTE — Progress Notes (Signed)
Subjective: 1 Day Post-Op Procedure(s) (LRB): AMPUTATION BELOW KNEE (Left) TRANSESOPHAGEAL ECHOCARDIOGRAM (TEE) (N/A) Patient reports pain as moderate.    Objective: Vital signs in last 24 hours: Temp:  [97.5 F (36.4 C)-99.1 F (37.3 C)] 98.4 F (36.9 C) (11/26 0514) Pulse Rate:  [73-78] 78 (11/26 0514) Resp:  [14-19] 18 (11/26 0514) BP: (128-158)/(69-93) 158/93 (11/26 0514) SpO2:  [94 %-100 %] 100 % (11/26 0514)  Intake/Output from previous day: 11/25 0701 - 11/26 0700 In: 800 [P.O.:300; I.V.:500] Out: 100 [Blood:100] Intake/Output this shift: No intake/output data recorded.  Recent Labs    11/19/18 0556 11/20/18 1515 11/21/18 0704  HGB 10.2* 11.9* 10.6*   Recent Labs    11/19/18 0556 11/20/18 1515 11/21/18 0704  WBC 7.7  --  11.8*  RBC 3.60*  --  3.84*  HCT 32.5* 35.0* 34.8*  PLT 245  --  331   Recent Labs    11/19/18 0556 11/20/18 1515 11/21/18 0704  NA 136 137 132*  K 4.0 4.6 5.1  CL 98  --  97*  CO2 29  --  25  BUN 23  --  36*  CREATININE 7.42*  --  8.13*  GLUCOSE 189* 109* 255*  CALCIUM 8.0*  --  8.2*   No results for input(s): LABPT, INR in the last 72 hours. Left lower extremity exam: Left BKA dressing clean and dry.  Reasonable range of motion of the left knee.   Assessment/Plan: 1 Day Post-Op Procedure(s) (LRB): AMPUTATION BELOW KNEE (Left) TRANSESOPHAGEAL ECHOCARDIOGRAM (TEE) (N/A) Plan: Up with physical therapy/OT. Inpatient rehab consult. Will let medical team/ID make antibiotic recommendations if needed. We will change dressing tomorrow and pull the [place] drains.    Erlene Senters 11/21/2018, 8:26 AM

## 2018-11-21 NOTE — PMR Pre-admission (Signed)
PMR Admission Coordinator Pre-Admission Assessment  Patient: [name]. is an 67 y.o., male MRN: [id number] DOB: [birth year] Height: 6\' 1"  (185.4 cm) Weight: 101 kg              Insurance Information HMO:     PPO:      PCP:      IPA:      80/20:      OTHER: no HMO PRIMARY: Medicare a and b      Policy#: [id number]      Subscriber: pt Benefits:  Phone #: online     Name: 11/21/2018 Eff. Date: 01/27/2015     Deduct: $1364      Out of Pocket Max: NONE      Life Max: NONE CIR: 100%      SNF: 20 full days Outpatient: 80%     Co-Pay: 20% Home Health: 100%      Co-Pay: none DME: 80%     Co-Pay: 20% Providers: pt choice  SECONDARY: AARP supplement      Policy#: [id number]      Subscriber: pt  Medicaid Application Date:       Case Manager:  Disability Application Date:       Case Worker:   Emergency Contact Information Contact Information    Name Relation Home Work Mobile   Cirigliano,Debra Spouse [phone]  [phone]     Current Medical History  Patient Admitting Diagnosis: BKA  History of Present Illness: [name] PAULINO Sr. is a 68 y.o. right handed male with history of cardiomyopathy, diabetes mellitus, end-stage renal disease with hemodialysis, hypertension, tobacco abuse,peripheral vascular disease with right second and third toe amputation as well as left second toe amputation. Presented  11/17/2018 with bilateral chronic foot ulcers. Patient does attend the wound clinic. Recent wound cultures staph MRSA started on vancomycin. MRI of left foot showed extensive osteomyelitis. Limb was not felt to be salvageable. 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 Dorna Leitz. Two penrose drains discontinued on 11/27.  MRSA bacteremia with intraoperative TEE negative for endocarditis. 14 days of therapy with Vancomycin via HD through 12/04/2018. No Osteo on MRI to right plantar foot neuropathic ulcer.  Past Medical History  Past Medical  History:  Diagnosis Date  . Cardiomyopathy ([place])   . Cataract    right eye  . Diabetes mellitus    type 2  . Diabetic foot ulcer ([place])   . Diabetic infection of right foot ([place])   . Diabetic peripheral neuropathy ([place])   . ESRD (end stage renal disease) ([place])    m-w-f [facility] [address]  . Glaucoma    left eye  . Headache   . HTN (hypertension)   . Osteomyelitis ([employer])   . Peripheral vascular disease ([place])    critical limb ischemia    Family History  family history includes Diabetes in his brother, brother, brother, father, and sister; Heart disease in his mother.  Prior Rehab/Hospitalizations:  Has the patient had major surgery during 100 days prior to admission? No  Current Medications   Current Facility-Administered Medications:  .  0.9 %  sodium chloride infusion, 100 mL, Intravenous, PRN, Gary Fleet, PA-C .  0.9 %  sodium chloride infusion, 100 mL, Intravenous, PRN, Gary Fleet, PA-C .  0.9 %  sodium chloride infusion, , Intravenous, Continuous, Gary Fleet, PA-C .  acetaminophen (TYLENOL) tablet 650 mg, 650 mg, Oral, Q6H PRN, Gary Fleet, PA-C, 650 mg at 11/21/18 0457 .  atorvastatin (LIPITOR) tablet 40 mg, 40 mg, Oral, QHS, Gary Fleet, PA-C, 40 mg at 11/21/18 2330 .  Chlorhexidine Gluconate Cloth 2 % PADS 6 each, 6 each, Topical, Daily, Gary Fleet, PA-C, 6 each at 11/22/18 0810 .  doxercalciferol (HECTOROL) injection 3 mcg, 3 mcg, Intravenous, Q M,W,F-HD, Gary Fleet, PA-C, 3 mcg at 11/19/18 0931 .  feeding supplement (PRO-STAT SUGAR FREE 64) liquid 30 mL, 30 mL, Oral, BID, Gary Fleet, PA-C, 30 mL at 11/22/18 0807 .  ferric citrate (AURYXIA) tablet 210 mg, 210 mg, Oral, TID WC, Gary Fleet, PA-C, 210 mg at 11/22/18 6160 .  heparin injection 1,000 Units, 1,000 Units, Dialysis, PRN, Gary Fleet, PA-C .  heparin injection 5,000 Units, 5,000 Units, Subcutaneous, Q8H, Gary Fleet, PA-C, 5,000 Units at 11/22/18 0511 .   HYDROmorphone (DILAUDID) injection 0.5-1 mg, 0.5-1 mg, Intravenous, Q3H PRN, Gary Fleet, PA-C, 1 mg at 11/22/18 0845 .  insulin aspart (novoLOG) injection 0-9 Units, 0-9 Units, Subcutaneous, TID WC, Gary Fleet, PA-C, 2 Units at 11/22/18 7371 .  insulin aspart protamine- aspart (NOVOLOG MIX 70/30) injection 5 Units, 5 Units, Subcutaneous, BID WC, Gary Fleet, PA-C, 5 Units at 11/22/18 0626 .  lidocaine (PF) (XYLOCAINE) 1 % injection 5 mL, 5 mL, Intradermal, PRN, Gary Fleet, PA-C .  lidocaine-prilocaine (EMLA) cream 1 application, 1 application, Topical, PRN, Gary Fleet, PA-C .  multivitamin with minerals tablet 1 tablet, 1 tablet, Oral, Q supper, Gary Fleet, PA-C, 1 tablet at 11/20/18 1851 .  mupirocin ointment (BACTROBAN) 2 % 1 application, 1 application, Topical, Daily PRN, Gary Fleet, PA-C .  mupirocin ointment (BACTROBAN) 2 % 1 application, 1 application, Nasal, BID, Gary Fleet, PA-C, 1 application at 94/85/46 2703 .  oxyCODONE-acetaminophen (PERCOCET/ROXICET) 5-325 MG per tablet 1-2 tablet, 1-2 tablet, Oral, Q4H PRN, Gary Fleet, PA-C, 2 tablet at 11/22/18 0811 .  pentafluoroprop-tetrafluoroeth (GEBAUERS) aerosol 1 application, 1 application, Topical, PRN, Gary Fleet, PA-C .  polyethylene glycol (MIRALAX / GLYCOLAX) packet 17 g, 17 g, Oral, Daily PRN, [name], [name], MD .  primidone (MYSOLINE) tablet 50 mg, 50 mg, Oral, QHS, Gary Fleet, PA-C, 50 mg at 11/21/18 2330 .  senna-docusate (Senokot-S) tablet 2 tablet, 2 tablet, Oral, BID, [name], [name], MD, 2 tablet at 11/22/18 0806 .  timolol (TIMOPTIC) 0.5 % ophthalmic solution 1 drop, 1 drop, Left Eye, BID, Gary Fleet, PA-C, 1 drop at 11/22/18 0809 .  vancomycin variable dose per unstable renal function (pharmacist dosing), , Does not apply, See admin instructions, Gary Fleet, PA-C  Patients Current Diet:  Diet Order            Diet renal/carb modified with fluid restriction Diet-HS  Snack? Nothing; Fluid restriction: 1200 mL Fluid; Room service appropriate? Yes; Fluid consistency: Thin  Diet effective now              Precautions / Restrictions Precautions Precautions: Fall Restrictions Weight Bearing Restrictions: No   Has the patient had 2 or more falls or a fall with injury in the past year?No  Prior Activity Level Community (5-7x/wk): Mod I with right foot boot; drove; retired  Development worker, international aid / Paramedic Devices/Equipment: Other (Comment)(surgical shoes) Home Equipment: Environmental consultant - 2 wheels, [employer] - single point  Prior Device Use: Indicate devices/aids used by the patient prior to current illness, exacerbation or injury? cane  Prior Functional Level Prior Function Level of Independence: Independent with assistive device(s) Comments: Occasionally uses cane  Self Care: Did the patient need help bathing, dressing, using the  toilet or eating?  Independent  Indoor Mobility: Did the patient need assistance with walking from room to room (with or without device)? Independent  Stairs: Did the patient need assistance with internal or external stairs (with or without device)? Independent  Functional Cognition: Did the patient need help planning regular tasks such as shopping or remembering to take medications? Independent  Current Functional Level Cognition  Overall Cognitive Status: Within Functional Limits for tasks assessed Orientation Level: Oriented X4 General Comments: Pt pleasant and motivated     Extremity Assessment (includes Sensation/Coordination)  Upper Extremity Assessment: Generalized weakness  Lower Extremity Assessment: RLE deficits/detail, LLE deficits/detail RLE Deficits / Details: wound on plantar surface of foot. no weight bearing restrictions in orders. RLE Sensation: history of peripheral neuropathy LLE Deficits / Details: s/p BKA    ADLs  Overall ADL's : Needs assistance/impaired Eating/Feeding:  Independent Grooming: Wash/dry hands, Wash/dry face, Supervision/safety Upper Body Bathing: Set up, Sitting Lower Body Bathing: Moderate assistance, Sit to/from stand, Cueing for compensatory techniques, Cueing for safety, Cueing for sequencing Upper Body Dressing : Set up, Sitting Lower Body Dressing: Moderate assistance, Sit to/from stand, Cueing for sequencing, Cueing for safety Toilet Transfer: Moderate assistance, Stand-pivot, RW Toileting- Clothing Manipulation and Hygiene: Moderate assistance, Sit to/from stand    Mobility  Overal bed mobility: Needs Assistance Bed Mobility: Sit to Supine Sit to supine: Min assist General bed mobility comments: Pt was received sitting up in the recliner    Transfers  Overall transfer level: Needs assistance Equipment used: Rolling walker (2 wheeled) Transfers: Sit to/from Stand Sit to Stand: Min assist Stand pivot transfers: Min assist General transfer comment: VC's for hand placement on seated surface for safety. Pt was able to power-up to full stand with heavy min assist for balance support as he reached for the RW.    Ambulation / Gait / Stairs / Wheelchair Mobility  Ambulation/Gait Ambulation/Gait assistance: Min assist, +2 safety/equipment Gait Distance (Feet): 50 Feet(25', seated rest, 25') Assistive device: Rolling walker (2 wheeled) Gait Pattern/deviations: Step-to pattern, Trunk flexed General Gait Details: VC's for sequencing and general safety with the RW. Close chair follow provided due to unsteadiness and min assist provided for balance support and safety.  Gait velocity: Decreased Gait velocity interpretation: <1.31 ft/sec, indicative of household ambulator    Posture / Balance Balance Overall balance assessment: Needs assistance Sitting-balance support: Bilateral upper extremity supported Sitting balance-Leahy Scale: Good Standing balance support: Bilateral upper extremity supported, During functional activity Standing  balance-Leahy Scale: Fair    Special needs/care consideration BiPAP/CPAP n/a CPM n/a Continuous Drip IV n/a Dialysis [place] MWF; drove self unles BP issues then would have someone pick him up Life Vest n/a Oxygen n/a Special Bed n/a Trach Size n/a Wound Vac n/a Skin left BKA surgical incision with ace wrap dressing; Signed         Show:Clear all [x] Manual[x] Template[x] Copied  Added by: [x] Meryle Ready, RN  [] Hover for details [facility] Nurse wound consult note Patient evaluated in [facility] 5M10.  Male visitor in the room. Reason for Consult: "chronic wound right heel".  However, there is no wound present on the right heel.  There is a chronic wound on the plantar surface of the right foot in the metatarsal 1 - 2 area. Wound type: chronic diabetic foot ulcer Measurement: 2 cm x 2 cm Wound bed: 100% callus Drainage (amount, consistency, odor) no drainage, no odor, no induration.  The patient states he has been going weekly to the wound clinic at [facility]  for treatment to the area. Periwound: Dry but intact Dressing procedure/placement/frequency: Cleanse the wound on the plantar surface of the right foot at the metatarsal head 1 - 2 location, with saline. Place a small foam pad over the wound.  Change every 3 days and prn. Monitor the wound area(s) for worsening of condition such as: Signs/symptoms of infection,  Increase in size,  Development of or worsening of odor, Development of pain, or increased pain at the affected locations.  Notify the medical team if any of these develop.  Thank you for the consult.  Discussed plan of care with the patient and bedside nurse.  [place] nurse will not follow at this time.  Please re-consult the [place] team if needed.  Val Riles, RN, MSN, CWOCN, CNS-BC, pager [phone]         Bowel mgmt: continent LBM 11/23 Bladder mgmt: little urine Diabetic mgmt yes pta   Previous Home Environment Living Arrangements: Spouse/significant other   Lives With: Spouse Available Help at Discharge: Family, Available PRN/intermittently Type of Home: House Home Layout: (basement, main level and then upstairs second level) Alternate Level Stairs-Rails: Left Alternate Level Stairs-Number of Steps: 2 flights of 7 each Home Access: Stairs to enter Entrance Stairs-Rails: Right Entrance Stairs-Number of Steps: 2 Bathroom Shower/Tub: Optometrist: Yes How Accessible: Accessible via walker [place]: No Additional Comments: 1/2 bath main level, no bedroom on main level  Discharge Living Setting Plans for Discharge Living Setting: Patient's home, Lives with (comment)(wife) Type of Home at Discharge: House Discharge Home Layout: 1/2 bath on main level, Bed/bath upstairs, Multi-level Alternate Level Stairs-Rails: Left Alternate Level Stairs-Number of Steps: 2 flights of 7 each Discharge Home Access: Stairs to enter Entrance Stairs-Rails: Right Entrance Stairs-Number of Steps: 2 Discharge Bathroom Shower/Tub: Tub/shower unit(second level) Discharge Bathroom Toilet: Standard Discharge Bathroom Accessibility: Yes How Accessible: Accessible via walker Does the patient have any problems obtaining your medications?: No  Social/Family/Support Systems Patient Roles: Spouse Contact Information: wife, [name] Anticipated Caregiver: wife; she works days Anticipated Ambulance person Information: see above Ability/Limitations of Caregiver: wife may take some FMLA Caregiver Availability: Intermittent Discharge Plan Discussed with Primary Caregiver: Yes Is Caregiver In Agreement with Plan?: Yes Does Caregiver/Family have Issues with Lodging/Transportation while Pt is in Rehab?: No  Goals/Additional Needs Patient/Family Goal for Rehab: Mod I to supervision with PT, possible w/c level, superivsion to min OT Expected length of stay: ELOS 7 to 10 days Dietary Needs: Renal diet Special  Service Needs: Patietn sees Dr. Dellia Nims for outpt wound care weekly debridement on right foor Pt/Family Agrees to Admission and willing to participate: Yes Program Orientation Provided & Reviewed with Pt/Caregiver Including Roles  & Responsibilities: Yes  Decrease burden of Care through IP rehab admission: n/a  Possible need for SNF placement upon discharge: not anticiapted  Patient Condition: This patient's condition remains as documented in the consult dated 11/21/2018, in which the Rehabilitation Physician determined and documented that the patient's condition is appropriate for intensive rehabilitative care in an inpatient rehabilitation facility. Will admit to inpatient rehab today.  Preadmission Screen Completed By:  Cleatrice Burke, 11/22/2018 9:06 AM ______________________________________________________________________   Discussed status with Dr. Posey Pronto on 11/22/2018 at  0906 and received telephone approval for admission today.  Admission Coordinator:  Cleatrice Burke, time 7096 Date 11/22/2018

## 2018-11-21 NOTE — Progress Notes (Signed)
[facility] for Infectious Disease  Date of Admission:  11/17/2018   Total days of antibiotics 5        Day 3 vancomycin          Patient ID: [name] PETTEWAY Sr. is a 67 y.o. male with  Principal Problem:   MRSA bacteremia Active Problems:   Diabetic ulcer of foot associated with diabetes mellitus due to underlying condition, with necrosis of muscle (HCC)   Sepsis ([place])   Leukocytosis   Osteomyelitis of left foot ([place])   HTN (hypertension)   Anemia due to chronic blood loss   Type 2 diabetes mellitus with left diabetic foot ulcer ([place])   ESRD on hemodialysis (HCC)   Tobacco abuse   Post-operative pain   . atorvastatin  40 mg Oral QHS  . Chlorhexidine Gluconate Cloth  6 each Topical Daily  . doxercalciferol  3 mcg Intravenous Q M,W,F-HD  . feeding supplement (PRO-STAT SUGAR FREE 64)  30 mL Oral BID  . ferric citrate  210 mg Oral TID WC  . heparin  5,000 Units Subcutaneous Q8H  . insulin aspart  0-9 Units Subcutaneous TID WC  . insulin aspart protamine- aspart  5 Units Subcutaneous BID WC  . multivitamin with minerals  1 tablet Oral Q supper  . mupirocin ointment  1 application Nasal BID  . primidone  50 mg Oral QHS  . senna-docusate  2 tablet Oral BID  . timolol  1 drop Left Eye BID  . vancomycin variable dose per unstable renal function (pharmacist dosing)   Does not apply See admin instructions    SUBJECTIVE: Feeling fine. Eating lunch. He is asking if I have seen Dr. [name] lately. No pain.    Allergies  Allergen Reactions  . Penicillins Hives, Swelling and Other (See Comments)    Ankle swelling Has patient had a PCN reaction causing immediate rash, facial/tongue/throat swelling, SOB or lightheadedness with hypotension: Yes Has patient had a PCN reaction causing severe rash involving mucus membranes or skin necrosis: No Has patient had a PCN reaction that required hospitalization: No Has patient had a PCN reaction occurring within the last 10  years: No If all of the above answers are "NO", then may proceed with Cephalosporin use.    OBJECTIVE: Vitals:   11/20/18 1807 11/20/18 2157 11/21/18 0514 11/21/18 0920  BP: (!) 142/69 138/76 (!) 158/93 112/69  Pulse: 73 77 78 71  Resp: 19 18 18 18   Temp: 98.4 F (36.9 C) 98 F (36.7 C) 98.4 F (36.9 C) 98.1 F (36.7 C)  TempSrc: Oral Oral Oral Oral  SpO2: 94% 95% 100% 98%  Weight:      Height:       Body mass index is 26.96 kg/m.  Physical Exam  Constitutional: He is oriented to person, place, and time.  Seated in chair comfortably. Wife is present.   HENT:  Mouth/Throat: No oral lesions. Normal dentition. No dental caries. No oropharyngeal exudate.  Cardiovascular: Normal rate, regular rhythm and normal heart sounds.  No murmur heard. Pulmonary/Chest: Effort normal and breath sounds normal.  Abdominal: Soft. He exhibits no distension. There is no tenderness.  Musculoskeletal:  L BKA wrapped with ACE dressing. No drainage noted.  R Plantar ulceration noted. No drainage, pain, surrounding erythema. Scaling skin. Multiple toes surgically absent.   Lymphadenopathy:    He has no cervical adenopathy.  Neurological: He is alert and oriented to person, place, and time.  Skin:  Skin is warm and dry. No rash noted.  Nursing note and vitals reviewed.   Lab Results Lab Results  Component Value Date   WBC 11.8 (H) 11/21/2018   HGB 10.6 (L) 11/21/2018   HCT 34.8 (L) 11/21/2018   MCV 90.6 11/21/2018   PLT 331 11/21/2018    Lab Results  Component Value Date   CREATININE 8.13 (H) 11/21/2018   BUN 36 (H) 11/21/2018   NA 132 (L) 11/21/2018   K 5.1 11/21/2018   CL 97 (L) 11/21/2018   CO2 25 11/21/2018    Lab Results  Component Value Date   ALT 37 11/17/2018   AST 49 (H) 11/17/2018   ALKPHOS 54 11/17/2018   BILITOT 0.9 11/17/2018     Microbiology: Recent Results (from the past 240 hour(s))  Blood Culture (routine x 2)     Status: None (Preliminary result)    Collection Time: 11/17/18  1:46 PM  Result Value Ref Range Status   Specimen Description BLOOD RIGHT ANTECUBITAL  Final   Special Requests   Final    BOTTLES DRAWN AEROBIC AND ANAEROBIC Blood Culture adequate volume   Culture   Final    NO GROWTH 4 DAYS Performed at [facility], [place] [address], [place], [place] [address]    Report Status PENDING  Incomplete  Blood Culture (routine x 2)     Status: None (Preliminary result)   Collection Time: 11/17/18  1:48 PM  Result Value Ref Range Status   Specimen Description BLOOD RIGHT HAND  Final   Special Requests   Final    BOTTLES DRAWN AEROBIC AND ANAEROBIC Blood Culture adequate volume   Culture   Final    NO GROWTH 4 DAYS Performed at [facility], [place] [address], [place], [place] [address]    Report Status PENDING  Incomplete  Surgical pcr screen     Status: Abnormal   Collection Time: 11/20/18  1:10 AM  Result Value Ref Range Status   MRSA, PCR NEGATIVE NEGATIVE Final   Staphylococcus aureus POSITIVE (A) NEGATIVE Final    Comment: RESULT CALLED TO, READ BACK BY AND VERIFIED WITH: RN T. Ellender Hose [phone] @0602  THANEY (NOTE) The Xpert SA Assay (FDA approved for NASAL specimens in patients 44 years of age and older), is one component of a comprehensive surveillance program. It is not intended to diagnose infection nor to guide or monitor treatment.    ASSESSMENT & PLAN:  1. MRSA Bacteremia = TEE intraop was negative for endocarditis. 14 days of therapy with vancomycin via HD treamtment through 12/09/019 from his source control (amputation). He has HD graft and would benefit from clearance blood cultures following treatment - appointment made.   2. Osteomyelitis L Foot = resolved s/p definitive amputation with good margins.   3. R Plantar foot neuropathic ulcer = will need ongoing offloading/wound care, diabetes management to help heal. No Osteo on MRI.   He will return to ID Clinic on 12/17 @ 11:15 am for evaluation off  antibiotics. Available as needed.   Janene Madeira, MSN, NP-C [facility] [place] Cell: [phone] Pager: [phone]  11/21/2018  2:50 PM

## 2018-11-21 NOTE — Evaluation (Signed)
Occupational Therapy Evaluation Patient Details Name: [name] VILLAMOR Sr. MRN: [id number] DOB: 10-06-1951 Today's Date: 11/21/2018    History of Present Illness [name] Sherrard. is a 67 y.o. male with medical history significant for 67 year old male with past medical history of diabetes end-stage renal disease on hemodialysis Monday Wednesday on Friday type 2 diabetes mellitus hypertension cardiomyopathy severe peripheral vascular disease multiple toe amputations, pt is now s/p L BKA   Clinical Impression    Pt admitted with above problem list, now s/p L BKA. Pt pleasant and cooperative, very motivated to participate in therapy. Pt requires min-mod A overall currently with ADLs and transfers. Extensive education with pt and family re residual limb positioning and care, fall reduction, use of DME, and desensitization of L residual limb. Pt would be a great candidate for CIR d/t high motivation, high PLOF, and good family support, without CIR placement pt at risk for falls, reduced mobility at home, and inability to perform ADLs independently.     Follow Up Recommendations  CIR    Equipment Recommendations  (Defer to next venue, if CIR not approved- TTB)    Recommendations for Other Services Rehab consult;PT consult     Precautions / Restrictions Precautions Precautions: Fall Required Braces or Orthoses: (No limb protector in room) Restrictions Weight Bearing Restrictions: No      Mobility Bed Mobility Overal bed mobility: Needs Assistance Bed Mobility: Sit to Supine       Sit to supine: Min assist   General bed mobility comments: cueing for body mechanics and positioning   Transfers Overall transfer level: Needs assistance Equipment used: Rolling walker (2 wheeled) Transfers: Sit to/from [facility] Sit to Stand: Mod assist Stand pivot transfers: Min assist       General transfer comment: cueing for technique, RW management    Balance Overall  balance assessment: Needs assistance Sitting-balance support: Bilateral upper extremity supported Sitting balance-Leahy Scale: Good     Standing balance support: Bilateral upper extremity supported;During functional activity Standing balance-Leahy Scale: Fair                             ADL either performed or assessed with clinical judgement   ADL Overall ADL's : Needs assistance/impaired Eating/Feeding: Independent   Grooming: Wash/dry hands;Wash/dry face;Supervision/safety   Upper Body Bathing: Set up;Sitting   Lower Body Bathing: Moderate assistance;Sit to/from stand;Cueing for compensatory techniques;Cueing for safety;Cueing for sequencing   Upper Body Dressing : Set up;Sitting   Lower Body Dressing: Moderate assistance;Sit to/from stand;Cueing for sequencing;Cueing for safety   Toilet Transfer: Moderate assistance;Stand-pivot;RW   Toileting- Clothing Manipulation and Hygiene: Moderate assistance;Sit to/from stand               Vision Baseline Vision/History: Retinopathy Patient Visual Report: No change from baseline       Perception     Praxis      Pertinent Vitals/Pain Pain Assessment: 0-10 Pain Score: 6  Pain Location: L residual limb Pain Descriptors / Indicators: Aching Pain Intervention(s): Monitored during session;Repositioned     Hand Dominance Right   Extremity/Trunk Assessment Upper Extremity Assessment Upper Extremity Assessment: Generalized weakness   Lower Extremity Assessment Lower Extremity Assessment: Defer to PT evaluation   Cervical / Trunk Assessment Cervical / Trunk Assessment: Kyphotic   Communication Communication Communication: No difficulties   Cognition Arousal/Alertness: Awake/alert Behavior During Therapy: WFL for tasks assessed/performed Overall Cognitive Status: Within Functional Limits for tasks assessed  General Comments: Pt pleasant and motivated                Home Living Family/patient expects to be discharged to:: Private residence Living Arrangements: Spouse/significant other Available Help at Discharge: Family Type of Home: House Home Access: Stairs to enter Technical brewer of Steps: 2 Entrance Stairs-Rails: None Home Layout: Multi-level;Bed/bath upstairs Alternate Level Stairs-Number of Steps: 2 flights of 7 each Alternate Level Stairs-Rails: Left Bathroom Shower/Tub: Teacher, early years/pre: Standard     Home Equipment: Environmental consultant - 2 wheels;Cane - single point   Additional Comments: Occasionally uses cane      Prior Functioning/Environment Level of Independence: Independent with assistive device(s)                 OT Problem List: Decreased strength;Decreased range of motion;Decreased activity tolerance;Impaired balance (sitting and/or standing);Decreased coordination;Decreased safety awareness;Decreased knowledge of use of DME or AE;Decreased knowledge of precautions;Pain;Increased edema;Cardiopulmonary status limiting activity;Impaired sensation      OT Treatment/Interventions: Self-care/ADL training;Therapeutic exercise;Neuromuscular education;Energy conservation;DME and/or AE instruction;Balance training;Patient/family education;Therapeutic activities    OT Goals(Current goals can be found in the care plan section) Acute Rehab OT Goals Patient Stated Goal: "to get stronger and do for myself" OT Goal Formulation: With patient/family Time For Goal Achievement: 11/28/18 Potential to Achieve Goals: Good  OT Frequency: Min 3X/week   Barriers to D/C: Inaccessible home environment;Decreased caregiver support   Pt's wife works during the day, bedroom upstairs          AM-PAC OT "6 Clicks" Daily Activity     Outcome Measure Help from another person eating meals?: None Help from another person taking care of personal grooming?: None Help from another person toileting, which includes using  toliet, bedpan, or urinal?: A Lot Help from another person bathing (including washing, rinsing, drying)?: A Little Help from another person to put on and taking off regular upper body clothing?: A Little Help from another person to put on and taking off regular lower body clothing?: A Lot 6 Click Score: 18   End of Session Nurse Communication: Mobility status  Activity Tolerance: Patient tolerated treatment well Patient left: in chair;with call bell/phone within reach;with family/visitor present  OT Visit Diagnosis: Muscle weakness (generalized) (M62.81);Unsteadiness on feet (R26.81)                Time: 9811-9147 OT Time Calculation (min): 49 min Charges:  OT General Charges $OT Visit: 1 Visit OT Evaluation $OT Eval Low Complexity: 1 Low OT Treatments $Self Care/Home Management : 23-37 mins   Curtis Sites OTR/L  11/21/2018, 9:09 AM

## 2018-11-21 NOTE — Evaluation (Signed)
Physical Therapy Evaluation Patient Details Name: [name]. MRN: [id number] DOB: [birth year] Today's Date: 11/21/2018   History of Present Illness  [name]. is a 67 y.o. male with medical history significant for 67 year old male with past medical history of diabetes end-stage renal disease on hemodialysis Monday Wednesday on Friday type 2 diabetes mellitus hypertension cardiomyopathy severe peripheral vascular disease multiple toe amputations, pt is now s/p L BKA  Clinical Impression  Pt admitted with above diagnosis. Pt currently with functional limitations due to the deficits listed below (see PT Problem List). At the time of PT eval pt was able to perform transfers and ambulation with min assist (+2 for safety/eqipment management). Initiated amputee HEP and gave handout. Pt is anticipating a prosthesis eventually and was educated on optimal positioning to decrease risk of contractures. Recommending CIR admission to maximize functional independence and safety at home. Acutely, pt will benefit from skilled PT to increase their independence and safety with mobility to allow discharge to the venue listed below.       Follow Up Recommendations CIR;Supervision/Assistance - 24 hour    Equipment Recommendations  Wheelchair (measurements PT)    Recommendations for Other Services Rehab consult     Precautions / Restrictions Precautions Precautions: Fall Required Braces or Orthoses: (No limb protector in room) Restrictions Weight Bearing Restrictions: No      Mobility  Bed Mobility               General bed mobility comments: Pt was received sitting up in the recliner  Transfers Overall transfer level: Needs assistance Equipment used: Rolling walker (2 wheeled) Transfers: Sit to/from Stand Sit to Stand: Min assist         General transfer comment: VC's for hand placement on seated surface for safety. Pt was able to power-up to full stand with heavy min  assist for balance support as he reached for the RW.  Ambulation/Gait Ambulation/Gait assistance: Min assist;+2 safety/equipment Gait Distance (Feet): 50 Feet(25', seated rest, 25') Assistive device: Rolling walker (2 wheeled) Gait Pattern/deviations: Step-to pattern;Trunk flexed Gait velocity: Decreased Gait velocity interpretation: <1.31 ft/sec, indicative of household ambulator General Gait Details: VC's for sequencing and general safety with the RW. Close chair follow provided due to unsteadiness and min assist provided for balance support and safety.   Stairs            Wheelchair Mobility    Modified Rankin (Stroke Patients Only)       Balance Overall balance assessment: Needs assistance Sitting-balance support: Bilateral upper extremity supported Sitting balance-Leahy Scale: Good     Standing balance support: Bilateral upper extremity supported;During functional activity Standing balance-Leahy Scale: Fair                               Pertinent Vitals/Pain Pain Assessment: Faces Faces Pain Scale: Hurts a little bit Pain Location: L residual limb Pain Descriptors / Indicators: Operative site guarding Pain Intervention(s): Monitored during session    Home Living Family/patient expects to be discharged to:: Private residence Living Arrangements: Spouse/significant other Available Help at Discharge: Family;Available PRN/intermittently Type of Home: House Home Access: Stairs to enter Entrance Stairs-Rails: Right Entrance Stairs-Number of Steps: 2 Home Layout: Multi-level;Bed/bath upstairs Home Equipment: Walker - 2 wheels;Cane - single point Additional Comments: Occasionally uses cane    Prior Function Level of Independence: Independent with assistive device(s)         Comments: Occasionally uses cane  Hand Dominance   Dominant Hand: Right    Extremity/Trunk Assessment   Upper Extremity Assessment Upper Extremity Assessment:  Generalized weakness    Lower Extremity Assessment Lower Extremity Assessment: RLE deficits/detail;LLE deficits/detail RLE Deficits / Details: wound on plantar surface of foot. no weight bearing restrictions in orders. RLE Sensation: history of peripheral neuropathy LLE Deficits / Details: s/p BKA    Cervical / Trunk Assessment Cervical / Trunk Assessment: Normal  Communication   Communication: No difficulties  Cognition Arousal/Alertness: Awake/alert Behavior During Therapy: WFL for tasks assessed/performed Overall Cognitive Status: Within Functional Limits for tasks assessed                                 General Comments: Pt pleasant and motivated       General Comments      Exercises     Assessment/Plan    PT Assessment Patient needs continued PT services  PT Problem List Decreased strength;Decreased range of motion;Decreased activity tolerance;Decreased balance;Decreased mobility;Decreased knowledge of use of DME;Decreased safety awareness;Decreased knowledge of precautions;Pain       PT Treatment Interventions DME instruction;Gait training;Functional mobility training;Therapeutic activities;Therapeutic exercise;Neuromuscular re-education;Patient/family education;Wheelchair mobility training    PT Goals (Current goals can be found in the Care Plan section)  Acute Rehab PT Goals Patient Stated Goal: "to get stronger and do for myself" PT Goal Formulation: With patient/family Time For Goal Achievement: 12/05/18 Potential to Achieve Goals: Good    Frequency Min 3X/week   Barriers to discharge        Co-evaluation               AM-PAC PT "6 Clicks" Mobility  Outcome Measure Help needed turning from your back to your side while in a flat bed without using bedrails?: None Help needed moving from lying on your back to sitting on the side of a flat bed without using bedrails?: A Little Help needed moving to and from a bed to a chair (including  a wheelchair)?: A Little Help needed standing up from a chair using your arms (e.g., wheelchair or bedside chair)?: A Little Help needed to walk in hospital room?: A Little Help needed climbing 3-5 steps with a railing? : Total 6 Click Score: 17    End of Session Equipment Utilized During Treatment: Gait belt Activity Tolerance: Patient tolerated treatment well Patient left: in chair;with call bell/phone within reach;with family/visitor present Nurse Communication: Mobility status PT Visit Diagnosis: Unsteadiness on feet (R26.81);Pain;Difficulty in walking, not elsewhere classified (R26.2) Pain - Right/Left: Left Pain - part of body: Leg    Time: 9169-4503 PT Time Calculation (min) (ACUTE ONLY): 34 min   Charges:   PT Evaluation $PT Eval Moderate Complexity: 1 Mod PT Treatments $Gait Training: 8-22 mins        Rolinda Roan, PT, DPT Acute Rehabilitation Services Pager: [phone] Office: [phone]   Thelma Comp 11/21/2018, 1:07 PM

## 2018-11-21 NOTE — Consult Note (Signed)
[facility] Nurse wound consult note Patient evaluated in [facility] 5M10.  Male visitor in the room. Reason for Consult: "chronic wound right heel".  However, there is no wound present on the right heel.  There is a chronic wound on the plantar surface of the right foot in the metatarsal 1 - 2 area. Wound type: chronic diabetic foot ulcer Measurement: 2 cm x 2 cm Wound bed: 100% callus Drainage (amount, consistency, odor) no drainage, no odor, no induration.  The patient states he has been going weekly to the wound clinic at [facility] for treatment to the area. Periwound: Dry but intact Dressing procedure/placement/frequency: Cleanse the wound on the plantar surface of the right foot at the metatarsal head 1 - 2 location, with saline. Place a small foam pad over the wound.  Change every 3 days and prn. Monitor the wound area(s) for worsening of condition such as: Signs/symptoms of infection,  Increase in size,  Development of or worsening of odor, Development of pain, or increased pain at the affected locations.  Notify the medical team if any of these develop.  Thank you for the consult.  Discussed plan of care with the patient and bedside nurse.  [place] nurse will not follow at this time.  Please re-consult the [place] team if needed.  Val Riles, RN, MSN, CWOCN, CNS-BC, pager [phone]

## 2018-11-21 NOTE — Anesthesia Postprocedure Evaluation (Signed)
Anesthesia Post Note  Patient: [name] MIERA Sr.  Procedure(s) Performed: AMPUTATION BELOW KNEE (Left Leg Lower) TRANSESOPHAGEAL ECHOCARDIOGRAM (TEE) (N/A )     Patient location during evaluation: PACU Anesthesia Type: General Level of consciousness: sedated Pain management: pain level controlled Vital Signs Assessment: post-procedure vital signs reviewed and stable Respiratory status: spontaneous breathing Cardiovascular status: stable Postop Assessment: no apparent nausea or vomiting Anesthetic complications: no    Last Vitals:  Vitals:   11/21/18 0514 11/21/18 0920  BP: (!) 158/93 112/69  Pulse: 78 71  Resp: 18 18  Temp: 36.9 C 36.7 C  SpO2: 100% 98%    Last Pain:  Vitals:   11/21/18 0920  TempSrc: Oral  PainSc:    Pain Goal:                 Huston Foley

## 2018-11-21 NOTE — Progress Notes (Signed)
Pharmacy Antibiotic Note  [name]. is a 67 y.o. male admitted on 11/17/2018 with sepsis secondary to MRSA bacteremia (from outside HD cultures) and osteomyelitis. Patient has ESRD on HD MWF. Pharmacy has been consulted for vancomycin dosing.  Patient was started on antibiotics outpatient on 11/19 for osteomyelitis of L foot. On 11/17/18, patient received ~30 minutes of dialysis with vancomycin 1 gm infusion PTA. Upon arrival to ED, vancomycin infusion was already paused with approximately half the bag remaining.  Today, patient remains afebrile and WBC is 11.8.  Patient tolerated full HD session 11/24 (holiday schedule), Vancomycin redosed on 11/24. The Vancomycin random level was 28 on 11/25 in the AM and went to surgery in the afternoon. Vancomycin random level of 38 mcg/ml in the AM on 11/26 indicates that patient may have received vancomycin pre-op in addition to vancomycin already on board. As the goal pre-HD level is 15-25 mcg/ml, will not redose patient after HD on 11/26 and allow vancomycin to be dialyzed down to a level ~20 mcg/ml for the next HD.    Plan: Hold vancomycin after HD on 11/26 and resume after HD on Friday 11/29 F/u endocarditis workup, clinical progress, repeat cultures and LOT   Height: 6\' 1"  (185.4 cm) Weight: 204 lb 5.9 oz (92.7 kg) IBW/kg (Calculated) : 79.9  Temp (24hrs), Avg:98 F (36.7 C), Min:97.5 F (36.4 C), Max:98.4 F (36.9 C)  Recent Labs  Lab 11/17/18 1346 11/17/18 1406  11/17/18 1555 11/18/18 0622 11/19/18 0556 11/20/18 0449 11/21/18 0704 11/21/18 0854  WBC 9.4  --   --   --  6.2 7.7  --  11.8*  --   CREATININE 8.28*  --   --   --  9.95* 7.42*  --  8.13*  --   LATICACIDVEN  --  2.01*  --  1.35  --   --   --   --   --   VANCORANDOM  --   --    < >  --   --   --  28  --  38   < > = values in this interval not displayed.    Estimated Creatinine Clearance: 10 mL/min (A) (by C-G formula based on SCr of 8.13 mg/dL (H)).    Allergies   Allergen Reactions  . Penicillins Hives, Swelling and Other (See Comments)    Ankle swelling Has patient had a PCN reaction causing immediate rash, facial/tongue/throat swelling, SOB or lightheadedness with hypotension: Yes Has patient had a PCN reaction causing severe rash involving mucus membranes or skin necrosis: No Has patient had a PCN reaction that required hospitalization: No Has patient had a PCN reaction occurring within the last 10 years: No If all of the above answers are "NO", then may proceed with Cephalosporin use.    Antimicrobials this admission: Cefepime 11/22>>11/24 Vancomycin 11/19>>  Microbiology results: 11/19 Outside HD BCx: MRSA 11/22 BCx: ngtd   [name] A. Levada Dy, PharmD, [place] Pager: [phone] Please utilize Amion for appropriate phone number to reach the unit pharmacist ([place])   11/21/2018     10:10 AM

## 2018-11-21 NOTE — Progress Notes (Signed)
[facility] Progress Note   Subjective: Up in chair. New L BKA with drsg intact. Pain controlled. Discussed in-pt rehab.   Objective Vitals:   11/20/18 1807 11/20/18 2157 11/21/18 0514 11/21/18 0920  BP: (!) 142/69 138/76 (!) 158/93 112/69  Pulse: 73 77 78 71  Resp: 19 18 18 18   Temp: 98.4 F (36.9 C) 98 F (36.7 C) 98.4 F (36.9 C) 98.1 F (36.7 C)  TempSrc: Oral Oral Oral Oral  SpO2: 94% 95% 100% 98%  Weight:      Height:       Physical Exam General: pleasant, NAD  Heart: S1.S2 RRR Lungs: CTAB A/P Abdomen: Active BS Extremities: R heel wounds.L BKA drsg CDI.   Dialysis Access: L AVF + bruit  Dialysis Orders:  Additional Objective Labs: Basic Metabolic Panel: Recent Labs  Lab 11/18/18 0622 11/19/18 0556 11/20/18 1515 11/21/18 0704  NA 134* 136 137 132*  K 3.9 4.0 4.6 5.1  CL 93* 98  --  97*  CO2 30 29  --  25  GLUCOSE 164* 189* 109* 255*  BUN 30* 23  --  36*  CREATININE 9.95* 7.42*  --  8.13*  CALCIUM 7.7* 8.0*  --  8.2*  PHOS  --  3.1  --   --    Liver Function Tests: Recent Labs  Lab 11/17/18 1346 11/19/18 0556  AST 49*  --   ALT 37  --   ALKPHOS 54  --   BILITOT 0.9  --   PROT 8.4*  --   ALBUMIN 2.8* 2.2*   No results for input(s): LIPASE, AMYLASE in the last 168 hours. CBC: Recent Labs  Lab 11/17/18 1346 11/18/18 0622 11/19/18 0556 11/20/18 1515 11/21/18 0704  WBC 9.4 6.2 7.7  --  11.8*  NEUTROABS 6.4  --   --   --   --   HGB 11.3* 9.6* 10.2* 11.9* 10.6*  HCT 36.3* 30.7* 32.5* 35.0* 34.8*  MCV 90.8 90.0 90.3  --  90.6  PLT 278 260 245  --  331   Blood Culture    Component Value Date/Time   SDES BLOOD RIGHT HAND 11/17/2018 1348   SPECREQUEST  11/17/2018 1348    BOTTLES DRAWN AEROBIC AND ANAEROBIC Blood Culture adequate volume   CULT  11/17/2018 1348    NO GROWTH 4 DAYS Performed at [facility] [address] [place], [place] [address]    REPTSTATUS PENDING 11/17/2018 1348    Cardiac Enzymes: No  results for input(s): CKTOTAL, CKMB, CKMBINDEX, TROPONINI in the last 168 hours. CBG: Recent Labs  Lab 11/20/18 1139 11/20/18 1714 11/20/18 1809 11/20/18 2157 11/21/18 0724  GLUCAP 133* 130* 158* 189* 239*   Iron Studies: No results for input(s): IRON, TIBC, TRANSFERRIN, FERRITIN in the last 72 hours. @lablastinr3 @ Studies/Results: Mr Foot Right Wo Contrast  Result Date: 11/19/2018 CLINICAL DATA:  Right foot wound. Bacteremia. Concern for osteomyelitis. End-stage renal disease. EXAM: MRI OF THE RIGHT FOREFOOT WITHOUT CONTRAST TECHNIQUE: Multiplanar, multisequence MR imaging of the right forefoot was performed. No intravenous contrast was administered. COMPARISON:  Previous MRI 05/12/2018.  Radiographs 01/12/2018. FINDINGS: Bones/Joint/Cartilage Multiple prior amputations are again noted, including amputations of the 2nd and 4th toes, the distal 3rd toe and the distal 5th metatarsal. These all appear stable. There is new extensive signal abnormality traversing the base of the 3rd metatarsal, most consistent with a stress fracture. There is associated bone marrow and surrounding soft tissue edema as well as cortical thickening. The  additional metatarsal bases are intact and normally aligned. There are stable moderate degenerative changes at the 1st metatarsophalangeal joint and it sesamoids. The tarsal bones appears stable. There are no significant joint effusions. Ligaments Intact Lisfranc ligament. Muscles and Tendons Mild muscular atrophy.  No significant tenosynovitis. Soft tissues As above, there is edema within the soft tissue surrounding the 3rd metatarsal base. The previously demonstrated soft tissue ulceration along the plantar aspect of the forefoot has improved. No focal fluid collection or generalized inflammation identified. IMPRESSION: 1. New abnormality involving the base of the 3rd metatarsal, most consistent with a stress fracture. 2. No evidence of recurrent osteomyelitis. 3.  Previously demonstrated inflammatory changes involving the plantar forefoot have improved. No evidence of soft tissue abscess. 4. Multiple previous amputations. Chronic degenerative changes at the 1st metatarsophalangeal joint. Electronically Signed   By: Richardean Sale M.D.   On: 11/19/2018 16:49   Mr Foot Left Wo Contrast  Result Date: 11/19/2018 CLINICAL DATA:  Sepsis EXAM: MRI OF THE LEFT HINDFOOT WITHOUT CONTRAST Osteomyelitis. Bony destruction of the base of the fourth metatarsal compatible with osteomyelitis. TECHNIQUE: Multiplanar, multisequence MR imaging of the ankle was performed. No intravenous contrast was administered. COMPARISON:  08/31/2018 FINDINGS: TENDONS Peroneal: Prominent peroneus longus tendinopathy posterior to the lateral malleolus. Poor definition of the distal peroneus longus tendon with at least partial tearing. The peroneus brevis is discontinuous as expected given that the fifth digit is absent. Posteromedial: Distal tibialis posterior tendinopathy. Anterior: Unremarkable Achilles: Unremarkable Plantar Fascia: Thickened medial band plantar fascia. LIGAMENTS Lateral: Thickened anterior inferior tibiofibular ligament. Medial: Irregularity and likely erosion along the lateral malleolar attachment of the deep tibiotalar portion of the deltoid ligament. Thickened superomedial portion of the spring ligament. Thickened Lisfranc ligament. CARTILAGE Ankle Joint: Degenerative chondral thinning. Subtalar Joints/Sinus Tarsi: Degenerative chondral thinning. Bones: Significant progression of destructive osseous edema involving the cuboid and lateral cuneiform. Significant increase in edema suspicious for osteomyelitis in the middle cuneiform. Worsening of arthropathy at the articulation between the navicular and medial cuneiform, osteomyelitis not excluded. There is abnormal edema in the bases of the second and third metatarsals suspicious for osteomyelitis. Bony destruction of the base of the  fourth metatarsal, compatible with osteomyelitis. Other: Subcutaneous edema along the lateral ankle and tracking especially in the plantar foot. Wound underneath the cuboid, likely ulceration. IMPRESSION: 1. Active osteomyelitis involving the cuboid, lateral cuneiform, and base of the fourth metatarsal. Probable osteomyelitis in the middle cuneiform and bases of the second and third metatarsals. Most of the edema at the articulation between the navicular and medial cuneiform is likely degenerative. 2. Suspected ulceration adjacent to the cuboid. 3. Absent fifth ray with discontinuous peroneus brevis tendon. Prominent thickening and irregularity and at least partial tearing of the peroneus longus. 4. Distal tibialis posterior tendinopathy. 5. Thickened medial band of the plantar fascia, possibly from plantar fasciitis. 6. Irregularity along the lateral malleolar attachment of the deep tibiotalar portion of the deltoid ligament. Thickened superomedial portion of the spring ligament. Electronically Signed   By: Van Clines M.D.   On: 11/19/2018 21:38   Medications: . sodium chloride    . sodium chloride    . sodium chloride     . atorvastatin  40 mg Oral QHS  . Chlorhexidine Gluconate Cloth  6 each Topical Daily  . doxercalciferol  3 mcg Intravenous Q M,W,F-HD  . feeding supplement (PRO-STAT SUGAR FREE 64)  30 mL Oral BID  . ferric citrate  210 mg Oral TID WC  . heparin  5,000 Units Subcutaneous Q8H  . insulin aspart  0-9 Units Subcutaneous TID WC  . insulin aspart protamine- aspart  5 Units Subcutaneous BID WC  . multivitamin with minerals  1 tablet Oral Q supper  . mupirocin ointment  1 application Nasal BID  . primidone  50 mg Oral QHS  . senna-docusate  2 tablet Oral BID  . timolol  1 drop Left Eye BID  . vancomycin variable dose per unstable renal function (pharmacist dosing)   Does not apply See admin instructions     Dialysis Orders: [facility] MWF 4.25h 500/800 EDW 95.5kg 2K/2.25Ca  UFP 2 AVF No Heparin -Hectorol 3 mcg IV TIW -Parsabiv 7.5 mg IV TIW  Assessment/Plan: 1. MRSA bacteremia - most likely 2/2 Chronic bilateral foot wounds Hx osteomyelitis of L foot. . [facility] +[facility]. Repeat cultures pending here. On vanc/cefepime.ID/Ortho following. MRI 11/24 revealed active osteomyelitis involving cubiod, lateral cuneiform and base of 4th metarsal. Probable osteo mid cuneiform and bases of the second and third metatarsal. 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 ESRD -MWF. Unable to complete HD Friday d/t fevers. Short HD 11/18/18. HD on holiday schedule this week. Next HD today. K+ 5.1 2.0 K bath. No heparin  3. Hypertension/volume - Better control of hypertension this AM.On midodrine as outpatient. Monitor. No gross volume on exam.HD 11/19/18 Pre wt 95.2 kg Post wt 92.7 kg. Now under OP EDW. Lower EDW on DC.  4. Anemia - hgb10.6.Follow trends. Not currently on ESA 5. Metabolic bone disease -Continue Hectorol/Auryxia binder. Phos 3.1 decrease binder dose.  No Parsabiv here. 6. Nutrition - Albumin 2.2 in setting of osteomyelitis.Renal diet/vitamins add prostat.  7. NICM EF 55-60% in 02/2018  8. PAD - s/p L tibial angioplasty/stenting 04/2018. Followed Dr. Scot Dock. On Plavix 9. DM T2 10. Wounds R heel-WOC for R heel.   Rosalina Dingwall H. Eular Panek NP-C 11/21/2018, 11:19 AM  [employer] [phone]

## 2018-11-21 NOTE — Progress Notes (Signed)
PROGRESS NOTE                                                                                                                                                                                                             Patient Demographics:    [name], is a 67 y.o. male, DOB - [birth year], ZSW:[id number]  Admit date - 11/17/2018   Admitting Physician [name], MD  Outpatient Primary MD for the patient is [name], MD  LOS - 4   No chief complaint on file.      Brief Narrative   67 y.o. male  with past medical history of diabetes end-stage renal disease on hemodialysis Monday Wednesday on Friday type 2 diabetes mellitus hypertension cardiomyopathy severe peripheral vascular disease multiple toe amputations bilaterally.  Bilateral foot ulcer, sent by hemodialysis centers for positive blood cultures for MRSA with known chronic LEFT  osteomyelitis, chronic left lower extremity wound, MRI showing extensive ostium myelitis of left foot with involvement of the cuboid and cuneiform, seen by orthopedic, plan for left BKA 11/20/2018.    Subjective:    [name] today has, No headache, No chest pain, No abdominal pain , some pain in left lower extremity, but overall controlled.  Assessment  & Plan :    Principal Problem:   Sepsis ([place]) Active Problems:   HTN (hypertension)   Anemia due to chronic blood loss   Osteomyelitis of left foot (HCC)   Type 2 diabetes mellitus with left diabetic foot ulcer ([place])   ESRD on hemodialysis ([place])   Diabetic ulcer of foot associated with diabetes mellitus due to underlying condition, with necrosis of muscle (HCC)   Leukocytosis   Tobacco abuse   Post-operative pain  MRSA bacteremia continue to infected left foot/osteomyelitis - He had blood culture positive for staph at hemodialysis center 11/14/2018, admitted for further work-up, following repeat blood cultures 11/17/2018.  -Secondary to  severely infected left foot with osteomyelitis, orthopedic on but greatly appreciated, this post BKA 11/20/2018 by Dr. [name]. -Blood culture 11/17/2018 remains negative -Right foot MRI with no evidence of osteomyelitis, but has chronic wound, wound care consulted.. -2D echo with no significant valvular disease,as well TEE with no vegetation. -ID input greatly appreciated, continue with vancomycin  -CIR consulted  Left foot infection/chronic osteomyelitis -Please see above discussion  Chronic right foot wound -  Continue with wound care, MRI with no evidence of osteomyelitis  ESRD -Renal consulted , HD per renal  Hypertension -Does appear patient on Midodrin, blood pressure uncontrolled, I have started Midodrin  PAD  - s/p L tibial angioplasty/stenting 04/2018.  Followed Dr. Scot Dock.  On Plavix, currently on hold in anticipation for surgery, will discuss with Ortho when appropriate to resume .  Diabetes mellitus type 2 -Continue with insulin sliding scale, CBG overall acceptable, A1c is 7.7  Anemia Of chronic kidney disease -Hemoglobin 10.6 ,continue to monitor closely    Code Status : Full  Family Communication  : wife at bedside  Disposition Plan  : CIR consult pending  Consults  :  Renal/ID  Procedures  : Left BKA by Dr. [name] 11/20/2018, TEE 11/20/2018  DVT Prophylaxis  :  [place] heaprin  Lab Results  Component Value Date   PLT 331 11/21/2018    Antibiotics  :    Anti-infectives (From admission, onward)   Start     Dose/Rate Route Frequency Ordered Stop   11/20/18 1516  vancomycin (VANCOCIN) 1-5 GM/200ML-% IVPB    Note to Pharmacy:  Debbe Bales, [place]: cabinet override      11/20/18 1516 11/21/18 0329   11/20/18 0600  vancomycin (VANCOCIN) IVPB 1000 mg/200 mL premix  Status:  Discontinued     1,000 mg 200 mL/hr over 60 Minutes Intravenous On call to O.R. 11/20/18 0110 11/20/18 1442   11/19/18 1600  vancomycin (VANCOCIN) IVPB 1000 mg/200 mL premix     1,000  mg 200 mL/hr over 60 Minutes Intravenous  Once 11/19/18 1447 11/19/18 1808   11/18/18 2000  ceFEPIme (MAXIPIME) 1 g in sodium chloride 0.9 % 100 mL IVPB  Status:  Discontinued     1 g 200 mL/hr over 30 Minutes Intravenous Every 24 hours 11/17/18 1808 11/19/18 1447   11/18/18 1530  vancomycin (VANCOCIN) IVPB 1000 mg/200 mL premix     1,000 mg 200 mL/hr over 60 Minutes Intravenous  Once 11/18/18 1459 11/18/18 1816   11/17/18 2200  ceFEPIme (MAXIPIME) 2 g in sodium chloride 0.9 % 100 mL IVPB  Status:  Discontinued     2 g 200 mL/hr over 30 Minutes Intravenous Every 8 hours 11/17/18 1827 11/17/18 1829   11/17/18 1826  vancomycin variable dose per unstable renal function (pharmacist dosing)      Does not apply See admin instructions 11/17/18 1826     11/17/18 1400  ceFEPIme (MAXIPIME) 2 g in sodium chloride 0.9 % 100 mL IVPB     2 g 200 mL/hr over 30 Minutes Intravenous  Once 11/17/18 1353 11/17/18 1510        Objective:   Vitals:   11/20/18 1807 11/20/18 2157 11/21/18 0514 11/21/18 0920  BP: (!) 142/69 138/76 (!) 158/93 112/69  Pulse: 73 77 78 71  Resp: 19 18 18 18   Temp: 98.4 F (36.9 C) 98 F (36.7 C) 98.4 F (36.9 C) 98.1 F (36.7 C)  TempSrc: Oral Oral Oral Oral  SpO2: 94% 95% 100% 98%  Weight:      Height:        Wt Readings from Last 3 Encounters:  11/19/18 92.7 kg  11/08/18 99.3 kg  10/31/18 97 kg     Intake/Output Summary (Last 24 hours) at 11/21/2018 1226 Last data filed at 11/21/2018 0600 Gross per 24 hour  Intake 800 ml  Output 100 ml  Net 700 ml     Physical Exam  Awake Alert, Oriented X 3, No  new F.N deficits, Normal affect Symmetrical Chest wall movement, Good air movement bilaterally, CTAB RRR,No Gallops,Rubs or new Murmurs, No Parasternal Heave +ve B.Sounds, Abd Soft, No tenderness, No rebound - guarding or rigidity. Left BKA, bandaged, chronic wound on the plantar surface of the right foot     Data Review:    CBC Recent Labs  Lab  11/17/18 1346 11/18/18 0622 11/19/18 0556 11/20/18 1515 11/21/18 0704  WBC 9.4 6.2 7.7  --  11.8*  HGB 11.3* 9.6* 10.2* 11.9* 10.6*  HCT 36.3* 30.7* 32.5* 35.0* 34.8*  PLT 278 260 245  --  331  MCV 90.8 90.0 90.3  --  90.6  MCH 28.3 28.2 28.3  --  27.6  MCHC 31.1 31.3 31.4  --  30.5  RDW 16.2* 16.2* 16.2*  --  16.4*  LYMPHSABS 1.5  --   --   --   --   MONOABS 1.2*  --   --   --   --   EOSABS 0.3  --   --   --   --   BASOSABS 0.0  --   --   --   --     Chemistries  Recent Labs  Lab 11/17/18 1346 11/18/18 0622 11/19/18 0556 11/20/18 1515 11/21/18 0704  NA 136 134* 136 137 132*  K 3.5 3.9 4.0 4.6 5.1  CL 92* 93* 98  --  97*  CO2 27 30 29   --  25  GLUCOSE 139* 164* 189* 109* 255*  BUN 23 30* 23  --  36*  CREATININE 8.28* 9.95* 7.42*  --  8.13*  CALCIUM 8.5* 7.7* 8.0*  --  8.2*  AST 49*  --   --   --   --   ALT 37  --   --   --   --   ALKPHOS 54  --   --   --   --   BILITOT 0.9  --   --   --   --    ------------------------------------------------------------------------------------------------------------------ No results for input(s): CHOL, HDL, LDLCALC, TRIG, CHOLHDL, LDLDIRECT in the last 72 hours.  Lab Results  Component Value Date   HGBA1C 7.7 (H) 11/20/2018   ------------------------------------------------------------------------------------------------------------------ No results for input(s): TSH, T4TOTAL, T3FREE, THYROIDAB in the last 72 hours.  Invalid input(s): FREET3 ------------------------------------------------------------------------------------------------------------------ No results for input(s): VITAMINB12, FOLATE, FERRITIN, TIBC, IRON, RETICCTPCT in the last 72 hours.  Coagulation profile No results for input(s): INR, PROTIME in the last 168 hours.  No results for input(s): DDIMER in the last 72 hours.  Cardiac Enzymes No results for input(s): CKMB, TROPONINI, MYOGLOBIN in the last 168 hours.  Invalid input(s):  CK ------------------------------------------------------------------------------------------------------------------ No results found for: BNP  Inpatient Medications  Scheduled Meds: . atorvastatin  40 mg Oral QHS  . Chlorhexidine Gluconate Cloth  6 each Topical Daily  . doxercalciferol  3 mcg Intravenous Q M,W,F-HD  . feeding supplement (PRO-STAT SUGAR FREE 64)  30 mL Oral BID  . ferric citrate  210 mg Oral TID WC  . heparin  5,000 Units Subcutaneous Q8H  . insulin aspart  0-9 Units Subcutaneous TID WC  . insulin aspart protamine- aspart  5 Units Subcutaneous BID WC  . multivitamin with minerals  1 tablet Oral Q supper  . mupirocin ointment  1 application Nasal BID  . primidone  50 mg Oral QHS  . senna-docusate  2 tablet Oral BID  . timolol  1 drop Left Eye BID  . vancomycin variable dose per unstable  renal function (pharmacist dosing)   Does not apply See admin instructions   Continuous Infusions: . sodium chloride    . sodium chloride    . sodium chloride     PRN Meds:.sodium chloride, sodium chloride, acetaminophen, heparin, HYDROmorphone (DILAUDID) injection, lidocaine (PF), lidocaine-prilocaine, mupirocin ointment, oxyCODONE-acetaminophen, pentafluoroprop-tetrafluoroeth, polyethylene glycol  Micro Results Recent Results (from the past 240 hour(s))  Blood Culture (routine x 2)     Status: None (Preliminary result)   Collection Time: 11/17/18  1:46 PM  Result Value Ref Range Status   Specimen Description BLOOD RIGHT ANTECUBITAL  Final   Special Requests   Final    BOTTLES DRAWN AEROBIC AND ANAEROBIC Blood Culture adequate volume   Culture   Final    NO GROWTH 4 DAYS Performed at [facility], [place] [address] [place], [place] [address]    Report Status PENDING  Incomplete  Blood Culture (routine x 2)     Status: None (Preliminary result)   Collection Time: 11/17/18  1:48 PM  Result Value Ref Range Status   Specimen Description BLOOD RIGHT HAND  Final   Special  Requests   Final    BOTTLES DRAWN AEROBIC AND ANAEROBIC Blood Culture adequate volume   Culture   Final    NO GROWTH 4 DAYS Performed at [facility], [place] [address] [place], [place] [address]    Report Status PENDING  Incomplete  Surgical pcr screen     Status: Abnormal   Collection Time: 11/20/18  1:10 AM  Result Value Ref Range Status   MRSA, PCR NEGATIVE NEGATIVE Final   Staphylococcus aureus POSITIVE (A) NEGATIVE Final    Comment: RESULT CALLED TO, READ BACK BY AND VERIFIED WITH: RN T. Ellender Hose [phone] @0602  THANEY (NOTE) The Xpert SA Assay (FDA approved for NASAL specimens in patients 26 years of age and older), is one component of a comprehensive surveillance program. It is not intended to diagnose infection nor to guide or monitor treatment.     Radiology Reports Mr Foot Right Wo Contrast  Result Date: 11/19/2018 CLINICAL DATA:  Right foot wound. Bacteremia. Concern for osteomyelitis. End-stage renal disease. EXAM: MRI OF THE RIGHT FOREFOOT WITHOUT CONTRAST TECHNIQUE: Multiplanar, multisequence MR imaging of the right forefoot was performed. No intravenous contrast was administered. COMPARISON:  Previous MRI 05/12/2018.  Radiographs 01/12/2018. FINDINGS: Bones/Joint/Cartilage Multiple prior amputations are again noted, including amputations of the 2nd and 4th toes, the distal 3rd toe and the distal 5th metatarsal. These all appear stable. There is new extensive signal abnormality traversing the base of the 3rd metatarsal, most consistent with a stress fracture. There is associated bone marrow and surrounding soft tissue edema as well as cortical thickening. The additional metatarsal bases are intact and normally aligned. There are stable moderate degenerative changes at the 1st metatarsophalangeal joint and it sesamoids. The tarsal bones appears stable. There are no significant joint effusions. Ligaments Intact Lisfranc ligament. Muscles and Tendons Mild muscular atrophy.  No  significant tenosynovitis. Soft tissues As above, there is edema within the soft tissue surrounding the 3rd metatarsal base. The previously demonstrated soft tissue ulceration along the plantar aspect of the forefoot has improved. No focal fluid collection or generalized inflammation identified. IMPRESSION: 1. New abnormality involving the base of the 3rd metatarsal, most consistent with a stress fracture. 2. No evidence of recurrent osteomyelitis. 3. Previously demonstrated inflammatory changes involving the plantar forefoot have improved. No evidence of soft tissue abscess. 4. Multiple previous amputations. Chronic degenerative changes  at the 1st metatarsophalangeal joint. Electronically Signed   By: Richardean Sale M.D.   On: 11/19/2018 16:49   Mr Foot Left Wo Contrast  Result Date: 11/19/2018 CLINICAL DATA:  Sepsis EXAM: MRI OF THE LEFT HINDFOOT WITHOUT CONTRAST Osteomyelitis. Bony destruction of the base of the fourth metatarsal compatible with osteomyelitis. TECHNIQUE: Multiplanar, multisequence MR imaging of the ankle was performed. No intravenous contrast was administered. COMPARISON:  08/31/2018 FINDINGS: TENDONS Peroneal: Prominent peroneus longus tendinopathy posterior to the lateral malleolus. Poor definition of the distal peroneus longus tendon with at least partial tearing. The peroneus brevis is discontinuous as expected given that the fifth digit is absent. Posteromedial: Distal tibialis posterior tendinopathy. Anterior: Unremarkable Achilles: Unremarkable Plantar Fascia: Thickened medial band plantar fascia. LIGAMENTS Lateral: Thickened anterior inferior tibiofibular ligament. Medial: Irregularity and likely erosion along the lateral malleolar attachment of the deep tibiotalar portion of the deltoid ligament. Thickened superomedial portion of the spring ligament. Thickened Lisfranc ligament. CARTILAGE Ankle Joint: Degenerative chondral thinning. Subtalar Joints/Sinus Tarsi: Degenerative  chondral thinning. Bones: Significant progression of destructive osseous edema involving the cuboid and lateral cuneiform. Significant increase in edema suspicious for osteomyelitis in the middle cuneiform. Worsening of arthropathy at the articulation between the navicular and medial cuneiform, osteomyelitis not excluded. There is abnormal edema in the bases of the second and third metatarsals suspicious for osteomyelitis. Bony destruction of the base of the fourth metatarsal, compatible with osteomyelitis. Other: Subcutaneous edema along the lateral ankle and tracking especially in the plantar foot. Wound underneath the cuboid, likely ulceration. IMPRESSION: 1. Active osteomyelitis involving the cuboid, lateral cuneiform, and base of the fourth metatarsal. Probable osteomyelitis in the middle cuneiform and bases of the second and third metatarsals. Most of the edema at the articulation between the navicular and medial cuneiform is likely degenerative. 2. Suspected ulceration adjacent to the cuboid. 3. Absent fifth ray with discontinuous peroneus brevis tendon. Prominent thickening and irregularity and at least partial tearing of the peroneus longus. 4. Distal tibialis posterior tendinopathy. 5. Thickened medial band of the plantar fascia, possibly from plantar fasciitis. 6. Irregularity along the lateral malleolar attachment of the deep tibiotalar portion of the deltoid ligament. Thickened superomedial portion of the spring ligament. Electronically Signed   By: Van Clines M.D.   On: 11/19/2018 21:38   Dg Chest Port 1 View  Result Date: 11/17/2018 CLINICAL DATA:  67 year old male with shortness of breath, weakness and sepsis EXAM: PORTABLE CHEST 1 VIEW COMPARISON:  Prior chest x-ray 05/10/2018 FINDINGS: The lungs are clear and negative for focal airspace consolidation, pulmonary edema or suspicious pulmonary nodule. No pleural effusion or pneumothorax. Cardiac and mediastinal contours are within normal  limits. Atherosclerotic plaque present in the transverse aorta. No acute fracture or lytic or blastic osseous lesions. The visualized upper abdominal bowel gas pattern is unremarkable. IMPRESSION: No acute cardiopulmonary process. Aortic Atherosclerosis (ICD10-170.0) Electronically Signed   By: Jacqulynn Cadet M.D.   On: 11/17/2018 14:08   Vas [place] Burnard Bunting With/wo Tbi  Result Date: 11/08/2018 LOWER EXTREMITY DOPPLER STUDY Indications: Peripheral artery disease. High Risk Factors: Hypertension, insulin dependent diabetes mellitus, Diabetes,                    past history of smoking.  Vascular               Left AVF Interventions:         Angioplasty and stent of left tibioperoneal trunk  05/11/2018. Limitations: Patient refused bandage wrappings of the right lower extremity to              be removed. Unable to perform right ABIs. Performing Technologist: Ronal Fear RVS, RCS  Examination Guidelines: A complete evaluation includes at minimum, Doppler waveform signals and systolic blood pressure reading at the level of bilateral brachial, anterior tibial, and posterior tibial arteries, when vessel segments are accessible. Bilateral testing is considered an integral part of a complete examination. Photoelectric Plethysmograph (PPG) waveforms and toe systolic pressure readings are included as required and additional duplex testing as needed. Limited examinations for reoccurring indications may be performed as noted.  ABI Findings: +--------+------------------+-----+--------+--------+ Right   Rt Pressure (mmHg)IndexWaveformComment  +--------+------------------+-----+--------+--------+ [id number]                                     +--------+------------------+-----+--------+--------+ +----+------------------+-----+--------+-------+ LeftLt Pressure (mmHg)IndexWaveformComment +----+------------------+-----+--------+-------+ PTA 173               1.10 biphasic         +----+------------------+-----+--------+-------+ DP  138               0.88 biphasic        +----+------------------+-----+--------+-------+ +-------+-----------+-----------+------------+------------+ ABI/TBIToday's ABIToday's TBIPrevious ABIPrevious TBI +-------+-----------+-----------+------------+------------+ Right                        1.43        0.41         +-------+-----------+-----------+------------+------------+ Left   1.10       0.88       1.21        0.32         +-------+-----------+-----------+------------+------------+ Arterial wall calcification precludes accurate ankle pressures and ABIs. Circumferential wall calcification observed on 2D imaging.  Summary: Left: Although ankle brachial indices are within normal limits (0.95-1.29), arterial Doppler waveforms at the ankle suggest some component of arterial occlusive disease.  *See table(s) above for measurements and observations.  Electronically signed by Deitra Mayo MD on 11/08/2018 at 10:00:28 AM.   Final    Vas [place] Lower Extremity Arterial Duplex  Result Date: 11/08/2018 LOWER EXTREMITY ARTERIAL DUPLEX STUDY Indications: Peripheral artery disease. High Risk Factors: Hypertension, insulin dependent diabetes mellitus, Diabetes.  Vascular               Left AVF Interventions:         Angioplasty and stent of left tibioperoneal trunk                        05/11/2018. Current ABI:           R=not performed, L=1.10 Performing Technologist: Ronal Fear RVS, RCS  Examination Guidelines: A complete evaluation includes B-mode imaging, spectral Doppler, color Doppler, and power Doppler as needed of all accessible portions of each vessel. Bilateral testing is considered an integral part of a complete examination. Limited examinations for reoccurring indications may be performed as noted.  Left Duplex Findings: +-----------+--------+-----+--------+--------+--------+            PSV  cm/sRatioStenosisWaveformComments +-----------+--------+-----+--------+--------+--------+ CFA Distal 130                  biphasic         +-----------+--------+-----+--------+--------+--------+ DFA        99  biphasic         +-----------+--------+-----+--------+--------+--------+ SFA Prox   122                  biphasic         +-----------+--------+-----+--------+--------+--------+ SFA Mid    149                  biphasic         +-----------+--------+-----+--------+--------+--------+ SFA Distal 105                  biphasic         +-----------+--------+-----+--------+--------+--------+ POP Prox   129                  biphasic         +-----------+--------+-----+--------+--------+--------+ POP Distal 151                  biphasic         +-----------+--------+-----+--------+--------+--------+ ATA Prox   80                   biphasic         +-----------+--------+-----+--------+--------+--------+ ATA Mid    64                   biphasic         +-----------+--------+-----+--------+--------+--------+ PTA Prox   166                  biphasic         +-----------+--------+-----+--------+--------+--------+ PTA Mid    156                  biphasic         +-----------+--------+-----+--------+--------+--------+ PTA Distal 144                  biphasic         +-----------+--------+-----+--------+--------+--------+ PERO Prox                       absent           +-----------+--------+-----+--------+--------+--------+ PERO Mid                        absent           +-----------+--------+-----+--------+--------+--------+ PERO Distal                     absent           +-----------+--------+-----+--------+--------+--------+ DP         58                   biphasic         +-----------+--------+-----+--------+--------+--------+  Summary: Left: Patent left lower extremity arterial system  and left tibioperoneal artery stent without evidence of hemodynamically significant stenosis. Circumferential wall calcification is observed throughout the left lower extremity.  See table(s) above for measurements and observations. Electronically signed by Deitra Mayo MD on 11/08/2018 at 9:42:03 AM.    Final      Phillips Climes M.D on 11/21/2018 at 12:26 PM  Between 7am to 7pm - Pager - [phone]  After 7pm go to [email or web address] - password [email or web address]  [facility] -  Office  [phone]

## 2018-11-21 NOTE — Progress Notes (Signed)
Inpatient Rehabilitation Admissions Coordinator  I met with patient and his wife at bedside to discuss goals and expectations of an inpt rehab admit. They prefer an inpt rehab admit. I will follow up tomorrow and am hopeful to admit tomorrow if felt medically ready to d/c pod #2.  Danne Baxter, RN, MSN Rehab Admissions Coordinator [phone] 11/21/2018 12:44 PM

## 2018-11-21 NOTE — Op Note (Signed)
NAME: Pennino SR., MALIKAI GUT. MEDICAL RECORD VQ:[id number] ACCOUNT [id number] DATE OF BIRTH:[birth year] FACILITY: [facility] LOCATION: [facility] PHYSICIAN:Dior Dominik L. Chrishon Martino, MD  OPERATIVE REPORT  DATE OF PROCEDURE:  11/20/2018  PREOPERATIVE DIAGNOSIS:  Severe osteomyelitis, left foot, with chronic open draining wound.  POSTOPERATIVE DIAGNOSIS:  Severe osteomyelitis, left foot, with chronic open draining wound.  PROCEDURE:  Left below knee amputation.  SURGEON:  Dorna Leitz, MD  ASSISTANT:  Gaspar Skeeters, PA-C.  He was present for the entire case and assisted by retraction, bone cuts, manipulation of tissue and helping to close to minimize OR time.  BRIEF HISTORY:  The patient is a 67 year old male with a history of multiple procedures done on the left foot.  He had significant osteomyelitis, which has been treated with amputation previously as well as antibiotic therapy.  He had new onset sepsis,  and MRI showed severe level of osteomyelitis throughout the midfoot and hindfoot of the left foot with an open significant draining wound that had worsened recently.  We talked about treatment options but felt that amputation was the only reasonable  course of action.  He was brought to the operating room for this procedure.  DESCRIPTION OF PROCEDURE:  The patient was brought to the operating room.  After adequate anesthesia was obtained with general anesthetic, the patient was placed supine on the operating table.  The left leg was prepped and draped in the usual sterile  fashion.  Following this, the leg was elevated for 3 minutes and the blood pressure tourniquet inflated to 300 mmHg in the thigh.  An incision was made following the amputation protocol for ischemic leg, about 12 cm of bone below the knee.  A horizontal  cut was made followed by a long posterior flap.  We dissected down to the level of the tibia, did a periosteal release here, and then did a cutting of the tibia.  We took down the muscles  between the tibia and the fibula and then resected the fibula  approximately a centimeter higher than the tibia.  We bevelled the tibia at this point, and then took the posterior musculature off of the back of the tibia and fibula and then amputated the leg.  At this point, we bevelled the posterior part of the leg  with the tourniquet down, tied and stick tied and double tied all arterial and venous bundles, took nerves and retracted them distally significantly and cut them and then fashioned the amputation with a very nice closure of tissue over the stump, as well  as a nice 2-layer skin closure.  Skin staples were used to close the skin.  A sterile compressive dressing was applied.  Penrose drains were placed, both in the medial and lateral side, to make sure there would be no bleeding causing issues.  At this  point, the leg was dressed as outlined, and the patient was taken to recovery and noted to be in satisfactory condition.  Estimated blood loss for the procedure was 300 mL, but the final can be gotten from the anesthetic record.  LN/NUANCE  D:11/20/2018 T:11/21/2018 JOB:[id number]

## 2018-11-22 ENCOUNTER — Encounter (HOSPITAL_COMMUNITY): Payer: Self-pay

## 2018-11-22 ENCOUNTER — Other Ambulatory Visit: Payer: Self-pay

## 2018-11-22 ENCOUNTER — Inpatient Hospital Stay (HOSPITAL_COMMUNITY)
Admission: RE | Admit: 2018-11-22 | Discharge: 2018-12-02 | DRG: 559 | Disposition: A | Discharge status: Home Health | Payer: Medicare Other | Source: Intra-hospital | Attending: Physical Medicine & Rehabilitation | Admitting: Physical Medicine & Rehabilitation

## 2018-11-22 DIAGNOSIS — Z95820 Peripheral vascular angioplasty status with implants and grafts: Secondary | ICD-10-CM

## 2018-11-22 DIAGNOSIS — M62838 Other muscle spasm: Secondary | ICD-10-CM | POA: Diagnosis not present

## 2018-11-22 DIAGNOSIS — D500 Iron deficiency anemia secondary to blood loss (chronic): Secondary | ICD-10-CM

## 2018-11-22 DIAGNOSIS — Z833 Family history of diabetes mellitus: Secondary | ICD-10-CM

## 2018-11-22 DIAGNOSIS — E1142 Type 2 diabetes mellitus with diabetic polyneuropathy: Secondary | ICD-10-CM | POA: Diagnosis present

## 2018-11-22 DIAGNOSIS — Z79899 Other long term (current) drug therapy: Secondary | ICD-10-CM

## 2018-11-22 DIAGNOSIS — D638 Anemia in other chronic diseases classified elsewhere: Secondary | ICD-10-CM | POA: Diagnosis not present

## 2018-11-22 DIAGNOSIS — G8918 Other acute postprocedural pain: Secondary | ICD-10-CM | POA: Diagnosis present

## 2018-11-22 DIAGNOSIS — E8889 Other specified metabolic disorders: Secondary | ICD-10-CM | POA: Diagnosis present

## 2018-11-22 DIAGNOSIS — A4102 Sepsis due to Methicillin resistant Staphylococcus aureus: Secondary | ICD-10-CM | POA: Diagnosis present

## 2018-11-22 DIAGNOSIS — Z4781 Encounter for orthopedic aftercare following surgical amputation: Secondary | ICD-10-CM | POA: Diagnosis not present

## 2018-11-22 DIAGNOSIS — D631 Anemia in chronic kidney disease: Secondary | ICD-10-CM | POA: Diagnosis present

## 2018-11-22 DIAGNOSIS — L97519 Non-pressure chronic ulcer of other part of right foot with unspecified severity: Secondary | ICD-10-CM | POA: Diagnosis present

## 2018-11-22 DIAGNOSIS — Z791 Long term (current) use of non-steroidal anti-inflammatories (NSAID): Secondary | ICD-10-CM

## 2018-11-22 DIAGNOSIS — E11621 Type 2 diabetes mellitus with foot ulcer: Secondary | ICD-10-CM | POA: Diagnosis present

## 2018-11-22 DIAGNOSIS — Z880 Allergy status to penicillin: Secondary | ICD-10-CM

## 2018-11-22 DIAGNOSIS — Z89421 Acquired absence of other right toe(s): Secondary | ICD-10-CM | POA: Diagnosis not present

## 2018-11-22 DIAGNOSIS — R7881 Bacteremia: Secondary | ICD-10-CM | POA: Diagnosis not present

## 2018-11-22 DIAGNOSIS — Z7902 Long term (current) use of antithrombotics/antiplatelets: Secondary | ICD-10-CM | POA: Diagnosis not present

## 2018-11-22 DIAGNOSIS — L97529 Non-pressure chronic ulcer of other part of left foot with unspecified severity: Secondary | ICD-10-CM | POA: Diagnosis present

## 2018-11-22 DIAGNOSIS — Z794 Long term (current) use of insulin: Secondary | ICD-10-CM

## 2018-11-22 DIAGNOSIS — R030 Elevated blood-pressure reading, without diagnosis of hypertension: Secondary | ICD-10-CM | POA: Diagnosis not present

## 2018-11-22 DIAGNOSIS — Z9842 Cataract extraction status, left eye: Secondary | ICD-10-CM

## 2018-11-22 DIAGNOSIS — Z8614 Personal history of Methicillin resistant Staphylococcus aureus infection: Secondary | ICD-10-CM

## 2018-11-22 DIAGNOSIS — Z8249 Family history of ischemic heart disease and other diseases of the circulatory system: Secondary | ICD-10-CM | POA: Diagnosis not present

## 2018-11-22 DIAGNOSIS — N186 End stage renal disease: Secondary | ICD-10-CM | POA: Diagnosis present

## 2018-11-22 DIAGNOSIS — Z23 Encounter for immunization: Secondary | ICD-10-CM | POA: Diagnosis not present

## 2018-11-22 DIAGNOSIS — Z992 Dependence on renal dialysis: Secondary | ICD-10-CM | POA: Diagnosis not present

## 2018-11-22 DIAGNOSIS — Z89512 Acquired absence of left leg below knee: Secondary | ICD-10-CM | POA: Diagnosis not present

## 2018-11-22 DIAGNOSIS — L03116 Cellulitis of left lower limb: Secondary | ICD-10-CM

## 2018-11-22 DIAGNOSIS — I120 Hypertensive chronic kidney disease with stage 5 chronic kidney disease or end stage renal disease: Secondary | ICD-10-CM | POA: Diagnosis present

## 2018-11-22 DIAGNOSIS — Z87891 Personal history of nicotine dependence: Secondary | ICD-10-CM | POA: Diagnosis not present

## 2018-11-22 DIAGNOSIS — E1151 Type 2 diabetes mellitus with diabetic peripheral angiopathy without gangrene: Secondary | ICD-10-CM | POA: Diagnosis present

## 2018-11-22 DIAGNOSIS — E1122 Type 2 diabetes mellitus with diabetic chronic kidney disease: Secondary | ICD-10-CM | POA: Diagnosis present

## 2018-11-22 DIAGNOSIS — I739 Peripheral vascular disease, unspecified: Secondary | ICD-10-CM | POA: Diagnosis not present

## 2018-11-22 DIAGNOSIS — N2581 Secondary hyperparathyroidism of renal origin: Secondary | ICD-10-CM | POA: Diagnosis not present

## 2018-11-22 DIAGNOSIS — E119 Type 2 diabetes mellitus without complications: Secondary | ICD-10-CM | POA: Diagnosis not present

## 2018-11-22 DIAGNOSIS — D72829 Elevated white blood cell count, unspecified: Secondary | ICD-10-CM | POA: Diagnosis not present

## 2018-11-22 DIAGNOSIS — A4902 Methicillin resistant Staphylococcus aureus infection, unspecified site: Secondary | ICD-10-CM | POA: Diagnosis not present

## 2018-11-22 DIAGNOSIS — S88112A Complete traumatic amputation at level between knee and ankle, left lower leg, initial encounter: Secondary | ICD-10-CM

## 2018-11-22 DIAGNOSIS — K5903 Drug induced constipation: Secondary | ICD-10-CM | POA: Diagnosis not present

## 2018-11-22 DIAGNOSIS — I428 Other cardiomyopathies: Secondary | ICD-10-CM | POA: Diagnosis present

## 2018-11-22 DIAGNOSIS — Z9049 Acquired absence of other specified parts of digestive tract: Secondary | ICD-10-CM | POA: Diagnosis not present

## 2018-11-22 LAB — GLUCOSE, CAPILLARY
GLUCOSE-CAPILLARY: 165 mg/dL — AB (ref 70–99)
GLUCOSE-CAPILLARY: 125 mg/dL — AB (ref 70–99)
GLUCOSE-CAPILLARY: 116 mg/dL — AB (ref 70–99)
Glucose-Capillary: 101 mg/dL — ABNORMAL HIGH (ref 70–99)
Glucose-Capillary: 160 mg/dL — ABNORMAL HIGH (ref 70–99)
Glucose-Capillary: 122 mg/dL — ABNORMAL HIGH (ref 70–99)

## 2018-11-22 LAB — CULTURE, BLOOD (ROUTINE X 2)
Culture: NO GROWTH
Culture: NO GROWTH
SPECIAL REQUESTS: ADEQUATE
Special Requests: ADEQUATE

## 2018-11-22 MED ORDER — FERRIC CITRATE 1 GM 210 MG(FE) PO TABS
210.0000 mg | ORAL_TABLET | Freq: Three times a day (TID) | ORAL | Status: DC
  Administered 2018-11-22 – 2018-12-02 (×27): 210 mg via ORAL
  Filled 2018-11-22 (×31): qty 1

## 2018-11-22 MED ORDER — SODIUM CHLORIDE 0.9 % IV SOLN: 100.0000 mL | INTRAVENOUS | Status: DC | PRN

## 2018-11-22 MED ORDER — PROCHLORPERAZINE EDISYLATE 10 MG/2ML IJ SOLN: 5.0000 mg | Freq: Four times a day (QID) | INTRAMUSCULAR | Status: DC | PRN

## 2018-11-22 MED ORDER — SODIUM CHLORIDE 0.9 % IV SOLN: INTRAVENOUS | Status: DC

## 2018-11-22 MED ORDER — PENTAFLUOROPROP-TETRAFLUOROETH EX AERO: 1.0000 "application " | INHALATION_SPRAY | CUTANEOUS | Status: DC | PRN

## 2018-11-22 MED ORDER — MUPIROCIN 2 % EX OINT
1.0000 "application " | TOPICAL_OINTMENT | Freq: Two times a day (BID) | CUTANEOUS | Status: AC
  Administered 2018-11-22 – 2018-11-24 (×5): 1 via NASAL
  Filled 2018-11-22: qty 22

## 2018-11-22 MED ORDER — LIDOCAINE-PRILOCAINE 2.5-2.5 % EX CREA
1.0000 "application " | TOPICAL_CREAM | CUTANEOUS | Status: DC | PRN
  Filled 2018-11-22: qty 5

## 2018-11-22 MED ORDER — LIDOCAINE HCL (PF) 1 % IJ SOLN
5.0000 mL | INTRAMUSCULAR | Status: DC | PRN
  Filled 2018-11-22: qty 5

## 2018-11-22 MED ORDER — DIPHENHYDRAMINE HCL 12.5 MG/5ML PO ELIX: 12.5000 mg | ORAL_SOLUTION | Freq: Four times a day (QID) | ORAL | Status: DC | PRN

## 2018-11-22 MED ORDER — LIDOCAINE-PRILOCAINE 2.5-2.5 % EX CREA: 1.0000 "application " | TOPICAL_CREAM | CUTANEOUS | Status: DC | PRN

## 2018-11-22 MED ORDER — GUAIFENESIN-DM 100-10 MG/5ML PO SYRP: 5.0000 mL | ORAL_SOLUTION | Freq: Four times a day (QID) | ORAL | Status: DC | PRN

## 2018-11-22 MED ORDER — MUPIROCIN 2 % EX OINT
1.0000 "application " | TOPICAL_OINTMENT | Freq: Every day | CUTANEOUS | Status: DC | PRN
  Filled 2018-11-22: qty 22

## 2018-11-22 MED ORDER — PRO-STAT SUGAR FREE PO LIQD
30.0000 mL | Freq: Two times a day (BID) | ORAL | Status: DC
  Administered 2018-11-22 – 2018-12-02 (×20): 30 mL via ORAL
  Filled 2018-11-22 (×21): qty 30

## 2018-11-22 MED ORDER — DOXERCALCIFEROL 4 MCG/2ML IV SOLN
3.0000 ug | INTRAVENOUS | Status: DC
  Administered 2018-11-24 – 2018-12-01 (×4): 3 ug via INTRAVENOUS
  Filled 2018-11-22 (×4): qty 2

## 2018-11-22 MED ORDER — POLYETHYLENE GLYCOL 3350 17 G PO PACK
17.0000 g | PACK | Freq: Every day | ORAL | Status: DC | PRN
  Administered 2018-11-22 – 2018-11-23 (×2): 17 g via ORAL
  Filled 2018-11-22 (×2): qty 1

## 2018-11-22 MED ORDER — HEPARIN SODIUM (PORCINE) 5000 UNIT/ML IJ SOLN
5000.0000 [IU] | Freq: Three times a day (TID) | INTRAMUSCULAR | Status: DC
  Administered 2018-11-22 – 2018-12-02 (×26): 5000 [IU] via SUBCUTANEOUS
  Filled 2018-11-22 (×26): qty 1

## 2018-11-22 MED ORDER — POLYETHYLENE GLYCOL 3350 17 G PO PACK: 17.0000 g | PACK | Freq: Every day | ORAL | Status: DC | PRN

## 2018-11-22 MED ORDER — LIDOCAINE HCL (PF) 1 % IJ SOLN: 5.0000 mL | INTRAMUSCULAR | Status: DC | PRN

## 2018-11-22 MED ORDER — HEPARIN SODIUM (PORCINE) 1000 UNIT/ML DIALYSIS
1000.0000 [IU] | INTRAMUSCULAR | Status: DC | PRN
  Filled 2018-11-22: qty 1

## 2018-11-22 MED ORDER — HEPARIN SODIUM (PORCINE) 1000 UNIT/ML DIALYSIS: 1000.0000 [IU] | INTRAMUSCULAR | Status: DC | PRN

## 2018-11-22 MED ORDER — INSULIN ASPART 100 UNIT/ML ~~LOC~~ SOLN
0.0000 [IU] | Freq: Three times a day (TID) | SUBCUTANEOUS | Status: DC
  Administered 2018-11-22 – 2018-11-23 (×2): 1 [IU] via SUBCUTANEOUS
  Administered 2018-11-25: 2 [IU] via SUBCUTANEOUS
  Administered 2018-11-29: 1 [IU] via SUBCUTANEOUS
  Administered 2018-11-29: 2 [IU] via SUBCUTANEOUS
  Administered 2018-11-30: 1 [IU] via SUBCUTANEOUS
  Administered 2018-12-01: 2 [IU] via SUBCUTANEOUS
  Administered 2018-12-01 – 2018-12-02 (×2): 1 [IU] via SUBCUTANEOUS

## 2018-11-22 MED ORDER — TRAMADOL HCL 50 MG PO TABS
50.0000 mg | ORAL_TABLET | Freq: Four times a day (QID) | ORAL | Status: DC | PRN
  Administered 2018-11-23 – 2018-12-02 (×11): 50 mg via ORAL
  Filled 2018-11-22 (×12): qty 1

## 2018-11-22 MED ORDER — METHOCARBAMOL 500 MG PO TABS
500.0000 mg | ORAL_TABLET | Freq: Four times a day (QID) | ORAL | Status: DC | PRN
  Administered 2018-11-22 – 2018-11-23 (×2): 500 mg via ORAL
  Filled 2018-11-22 (×2): qty 1

## 2018-11-22 MED ORDER — TRAZODONE HCL 50 MG PO TABS
25.0000 mg | ORAL_TABLET | Freq: Every evening | ORAL | Status: DC | PRN
  Administered 2018-11-22 – 2018-12-01 (×7): 50 mg via ORAL
  Filled 2018-11-22 (×8): qty 1

## 2018-11-22 MED ORDER — SENNOSIDES-DOCUSATE SODIUM 8.6-50 MG PO TABS
2.0000 | ORAL_TABLET | Freq: Two times a day (BID) | ORAL | Status: DC
  Administered 2018-11-22 – 2018-12-02 (×16): 2 via ORAL
  Filled 2018-11-22 (×20): qty 2

## 2018-11-22 MED ORDER — INSULIN ASPART PROT & ASPART (70-30 MIX) 100 UNIT/ML ~~LOC~~ SUSP
5.0000 [IU] | Freq: Two times a day (BID) | SUBCUTANEOUS | Status: DC
  Administered 2018-11-22 – 2018-12-02 (×19): 5 [IU] via SUBCUTANEOUS
  Filled 2018-11-22 (×2): qty 10

## 2018-11-22 MED ORDER — ACETAMINOPHEN 325 MG PO TABS
325.0000 mg | ORAL_TABLET | ORAL | Status: DC | PRN
  Administered 2018-11-25 (×2): 650 mg via ORAL
  Administered 2018-11-25: 325 mg via ORAL
  Administered 2018-11-29 – 2018-12-02 (×4): 650 mg via ORAL
  Filled 2018-11-22 (×6): qty 2

## 2018-11-22 MED ORDER — OXYCODONE-ACETAMINOPHEN 5-325 MG PO TABS
1.0000 | ORAL_TABLET | ORAL | Status: DC | PRN
  Administered 2018-11-22 – 2018-11-23 (×5): 2 via ORAL
  Administered 2018-11-24: 1 via ORAL
  Filled 2018-11-22 (×5): qty 2

## 2018-11-22 MED ORDER — BISACODYL 10 MG RE SUPP: 10.0000 mg | Freq: Every day | RECTAL | Status: DC | PRN

## 2018-11-22 MED ORDER — HYDROCERIN EX CREA
TOPICAL_CREAM | Freq: Two times a day (BID) | CUTANEOUS | Status: DC
  Administered 2018-11-22 – 2018-12-02 (×20): via TOPICAL
  Filled 2018-11-22: qty 113

## 2018-11-22 MED ORDER — ADULT MULTIVITAMIN W/MINERALS CH
1.0000 | ORAL_TABLET | Freq: Every day | ORAL | Status: DC
  Administered 2018-11-22 – 2018-12-01 (×10): 1 via ORAL
  Filled 2018-11-22 (×10): qty 1

## 2018-11-22 MED ORDER — PRIMIDONE 50 MG PO TABS
50.0000 mg | ORAL_TABLET | Freq: Every day | ORAL | Status: DC
  Administered 2018-11-22 – 2018-12-01 (×10): 50 mg via ORAL
  Filled 2018-11-22 (×10): qty 1

## 2018-11-22 MED ORDER — VANCOMYCIN VARIABLE DOSE PER UNSTABLE RENAL FUNCTION (PHARMACIST DOSING): Status: DC

## 2018-11-22 MED ORDER — TIMOLOL MALEATE 0.5 % OP SOLN
1.0000 [drp] | Freq: Two times a day (BID) | OPHTHALMIC | Status: DC
  Administered 2018-11-22 – 2018-12-02 (×20): 1 [drp] via OPHTHALMIC
  Filled 2018-11-22 (×2): qty 5

## 2018-11-22 MED ORDER — PROCHLORPERAZINE 25 MG RE SUPP: 12.5000 mg | Freq: Four times a day (QID) | RECTAL | Status: DC | PRN

## 2018-11-22 MED ORDER — ATORVASTATIN CALCIUM 40 MG PO TABS
40.0000 mg | ORAL_TABLET | Freq: Every day | ORAL | Status: DC
  Administered 2018-11-22 – 2018-12-01 (×10): 40 mg via ORAL
  Filled 2018-11-22 (×10): qty 1

## 2018-11-22 MED ORDER — ALUMINUM HYDROXIDE GEL 320 MG/5ML PO SUSP
10.0000 mL | Freq: Four times a day (QID) | ORAL | Status: DC | PRN
  Filled 2018-11-22: qty 30

## 2018-11-22 MED ORDER — PROCHLORPERAZINE MALEATE 5 MG PO TABS: 5.0000 mg | ORAL_TABLET | Freq: Four times a day (QID) | ORAL | Status: DC | PRN

## 2018-11-22 NOTE — Progress Notes (Signed)
Cristina Gong, RN  Rehab Admission Coordinator  Physical Medicine and Rehabilitation  PMR Pre-admission  Signed  Date of Service:  11/21/2018 4:54 PM       Related encounter: ED to Hosp-Admission (Discharged) from 11/17/2018 in [facility]      Signed         Show:Clear all [x] Manual[x] Template[x] Copied  Added by: [x] Cristina Gong, RN  [] Hover for details PMR Admission Coordinator Pre-Admission Assessment  Patient: [name]. is an 67 y.o., male MRN: [id number] DOB: 10-05-1951 Height: 6\' 1"  (185.4 cm) Weight: 101 kg                                                                                                                                                  Insurance Information HMO:     PPO:      PCP:      IPA:      80/20:      OTHER: no HMO PRIMARY: Medicare a and b      Policy#: [id number]      Subscriber: pt Benefits:  Phone #: online     Name: 11/21/2018 Eff. Date: 01/27/2015     Deduct: $1364      Out of Pocket Max: NONE      Life Max: NONE CIR: 100%      SNF: 20 full days Outpatient: 80%     Co-Pay: 20% Home Health: 100%      Co-Pay: none DME: 80%     Co-Pay: 20% Providers: pt choice  SECONDARY: AARP supplement      Policy#: [id number]      Subscriber: pt  Medicaid Application Date:       Case Manager:  Disability Application Date:       Case Worker:   Emergency Contact Information         Contact Information    Name Relation Home Work Mobile   Mase,Debra Spouse [phone]  [phone]     Current Medical History  Patient Admitting Diagnosis: BKA  History of Present Illness: [name] Sr.is a 67 y.o.right handedmalewith history of cardiomyopathy, diabetes mellitus, end-stage renal disease with hemodialysis, hypertension, tobacco abuse,peripheral vascular disease with right second and third toe amputation as well as left second toe amputation.Presented11/22/2019 with bilateral chronic foot  ulcers. Patient does attend the wound clinic. Recent wound cultures staph MRSA started on vancomycin. MRI of left foot showed extensive osteomyelitis. Limb was not felt to be salvageable. 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 Dorna Leitz. Two penrose drains discontinued on 11/27.  MRSA bacteremia with intraoperative TEE negative for endocarditis. 14 days of therapy with Vancomycin via HD through 12/04/2018. No Osteo on MRI to right plantar foot neuropathic ulcer.  Past Medical History      Past Medical History:  Diagnosis Date  . Cardiomyopathy ([place])   . Cataract    right eye  . Diabetes mellitus    type 2  . Diabetic foot ulcer ([place])   . Diabetic infection of right foot ([place])   . Diabetic peripheral neuropathy ([place])   . ESRD (end stage renal disease) ([place])    m-w-f [facility]  . Glaucoma    left eye  . Headache   . HTN (hypertension)   . Osteomyelitis ([place])   . Peripheral vascular disease ([place])    critical limb ischemia    Family History  family history includes Diabetes in his brother, brother, brother, father, and sister; Heart disease in his mother.  Prior Rehab/Hospitalizations:  Has the patient had major surgery during 100 days prior to admission? No  Current Medications   Current Facility-Administered Medications:  .  0.9 %  sodium chloride infusion, 100 mL, Intravenous, PRN, Gary Fleet, PA-C .  0.9 %  sodium chloride infusion, 100 mL, Intravenous, PRN, Gary Fleet, PA-C .  0.9 %  sodium chloride infusion, , Intravenous, Continuous, Gary Fleet, PA-C .  acetaminophen (TYLENOL) tablet 650 mg, 650 mg, Oral, Q6H PRN, Gary Fleet, PA-C, 650 mg at 11/21/18 0457 .  atorvastatin (LIPITOR) tablet 40 mg, 40 mg, Oral, QHS, Gary Fleet, PA-C, 40 mg at 11/21/18 2330 .  Chlorhexidine Gluconate Cloth 2 % PADS 6 each, 6 each, Topical, Daily, Gary Fleet, PA-C, 6 each  at 11/22/18 0810 .  doxercalciferol (HECTOROL) injection 3 mcg, 3 mcg, Intravenous, Q M,W,F-HD, Gary Fleet, PA-C, 3 mcg at 11/19/18 0931 .  feeding supplement (PRO-STAT SUGAR FREE 64) liquid 30 mL, 30 mL, Oral, BID, Gary Fleet, PA-C, 30 mL at 11/22/18 0807 .  ferric citrate (AURYXIA) tablet 210 mg, 210 mg, Oral, TID WC, Gary Fleet, PA-C, 210 mg at 11/22/18 7829 .  heparin injection 1,000 Units, 1,000 Units, Dialysis, PRN, Gary Fleet, PA-C .  heparin injection 5,000 Units, 5,000 Units, Subcutaneous, Q8H, Gary Fleet, PA-C, 5,000 Units at 11/22/18 0511 .  HYDROmorphone (DILAUDID) injection 0.5-1 mg, 0.5-1 mg, Intravenous, Q3H PRN, Gary Fleet, PA-C, 1 mg at 11/22/18 0845 .  insulin aspart (novoLOG) injection 0-9 Units, 0-9 Units, Subcutaneous, TID WC, Gary Fleet, PA-C, 2 Units at 11/22/18 5621 .  insulin aspart protamine- aspart (NOVOLOG MIX 70/30) injection 5 Units, 5 Units, Subcutaneous, BID WC, Gary Fleet, PA-C, 5 Units at 11/22/18 3086 .  lidocaine (PF) (XYLOCAINE) 1 % injection 5 mL, 5 mL, Intradermal, PRN, Gary Fleet, PA-C .  lidocaine-prilocaine (EMLA) cream 1 application, 1 application, Topical, PRN, Gary Fleet, PA-C .  multivitamin with minerals tablet 1 tablet, 1 tablet, Oral, Q supper, Gary Fleet, PA-C, 1 tablet at 11/20/18 1851 .  mupirocin ointment (BACTROBAN) 2 % 1 application, 1 application, Topical, Daily PRN, Gary Fleet, PA-C .  mupirocin ointment (BACTROBAN) 2 % 1 application, 1 application, Nasal, BID, Gary Fleet, PA-C, 1 application at 57/84/69 6295 .  oxyCODONE-acetaminophen (PERCOCET/ROXICET) 5-325 MG per tablet 1-2 tablet, 1-2 tablet, Oral, Q4H PRN, Gary Fleet, PA-C, 2 tablet at 11/22/18 0811 .  pentafluoroprop-tetrafluoroeth (GEBAUERS) aerosol 1 application, 1 application, Topical, PRN, Gary Fleet, PA-C .  polyethylene glycol (MIRALAX / GLYCOLAX) packet 17 g, 17 g, Oral, Daily PRN, [name], [name], MD .  primidone  (MYSOLINE) tablet 50 mg, 50 mg, Oral, QHS, Gary Fleet, PA-C, 50 mg at 11/21/18 2330 .  senna-docusate (Senokot-S) tablet 2 tablet, 2 tablet, Oral, BID, [name], [name], MD, 2 tablet at 11/22/18 0806 .  timolol (TIMOPTIC) 0.5 % ophthalmic solution 1 drop, 1 drop, Left Eye, BID, Gary Fleet, PA-C, 1 drop at 11/22/18 0809 .  vancomycin variable dose per unstable renal function (pharmacist dosing), , Does not apply, See admin instructions, Gary Fleet, PA-C  Patients Current Diet:     Diet Order                  Diet renal/carb modified with fluid restriction Diet-HS Snack? Nothing; Fluid restriction: 1200 mL Fluid; Room service appropriate? Yes; Fluid consistency: Thin  Diet effective now               Precautions / Restrictions Precautions Precautions: Fall Restrictions Weight Bearing Restrictions: No   Has the patient had 2 or more falls or a fall with injury in the past year?No  Prior Activity Level Community (5-7x/wk): Mod I with right foot boot; drove; retired  Development worker, international aid / Paramedic Devices/Equipment: Other (Comment)(surgical shoes) Home Equipment: Environmental consultant - 2 wheels, [employer] - single point  Prior Device Use: Indicate devices/aids used by the patient prior to current illness, exacerbation or injury? cane  Prior Functional Level Prior Function Level of Independence: Independent with assistive device(s) Comments: Occasionally uses cane  Self Care: Did the patient need help bathing, dressing, using the toilet or eating?  Independent  Indoor Mobility: Did the patient need assistance with walking from room to room (with or without device)? Independent  Stairs: Did the patient need assistance with internal or external stairs (with or without device)? Independent  Functional Cognition: Did the patient need help planning regular tasks such as shopping or remembering to take medications? Independent  Current Functional  Level Cognition  Overall Cognitive Status: Within Functional Limits for tasks assessed Orientation Level: Oriented X4 General Comments: Pt pleasant and motivated     Extremity Assessment (includes Sensation/Coordination)  Upper Extremity Assessment: Generalized weakness  Lower Extremity Assessment: RLE deficits/detail, LLE deficits/detail RLE Deficits / Details: wound on plantar surface of foot. no weight bearing restrictions in orders. RLE Sensation: history of peripheral neuropathy LLE Deficits / Details: s/p BKA    ADLs  Overall ADL's : Needs assistance/impaired Eating/Feeding: Independent Grooming: Wash/dry hands, Wash/dry face, Supervision/safety Upper Body Bathing: Set up, Sitting Lower Body Bathing: Moderate assistance, Sit to/from stand, Cueing for compensatory techniques, Cueing for safety, Cueing for sequencing Upper Body Dressing : Set up, Sitting Lower Body Dressing: Moderate assistance, Sit to/from stand, Cueing for sequencing, Cueing for safety Toilet Transfer: Moderate assistance, Stand-pivot, RW Toileting- Clothing Manipulation and Hygiene: Moderate assistance, Sit to/from stand    Mobility  Overal bed mobility: Needs Assistance Bed Mobility: Sit to Supine Sit to supine: Min assist General bed mobility comments: Pt was received sitting up in the recliner    Transfers  Overall transfer level: Needs assistance Equipment used: Rolling walker (2 wheeled) Transfers: Sit to/from Stand Sit to Stand: Min assist Stand pivot transfers: Min assist General transfer comment: VC's for hand placement on seated surface for safety. Pt was able to power-up to full stand with heavy min assist for balance support as he reached for the RW.    Ambulation / Gait / Stairs / Wheelchair Mobility  Ambulation/Gait Ambulation/Gait assistance: Min assist, +2 safety/equipment Gait Distance (Feet): 50 Feet(25', seated rest, 25') Assistive device: Rolling walker (2 wheeled) Gait  Pattern/deviations: Step-to pattern, Trunk flexed General Gait Details: VC's for sequencing and general safety with the RW. Close chair follow provided due to unsteadiness and min assist provided for balance support and safety.  Gait velocity: Decreased Gait velocity interpretation: <1.31 ft/sec, indicative of household ambulator    Posture / Balance Balance Overall balance assessment: Needs assistance Sitting-balance support: Bilateral upper extremity supported Sitting balance-Leahy Scale: Good Standing balance support: Bilateral upper extremity supported, During functional activity Standing balance-Leahy Scale: Fair    Special needs/care consideration BiPAP/CPAP n/a CPM n/a Continuous Drip IV n/a Dialysis [facility] MWF; drove self unles BP issues then would have someone pick him up Life Vest n/a Oxygen n/a Special Bed n/a Trach Size n/a Wound Vac n/a Skin left BKA surgical incision with ace wrap dressing; Signed         Show:Clear all [x] Manual[x] Template[x] Copied  Added by: [x] Meryle Ready, RN  [] Hover for details [place] Nurse wound consult note Patient evaluated in [facility] 5M[facility]. Male visitor in the room. Reason for Consult:"chronic wound right heel". However, there is no wound present on the right heel. There is a chronic wound on the plantar surface of the right foot in the metatarsal 1 - 2 area. Wound type:chronic diabetic foot ulcer Measurement:2 cm x 2 cm Wound bed:100% callus Drainage (amount, consistency, odor)no drainage, no odor, no induration. The patient states he has been going weekly to the wound clinic at [facility] for treatment to the area. Periwound:Dry but intact Dressing procedure/placement/frequency:Cleanse the wound on the plantar surface of the right foot at the metatarsal head 1 - 2 location, with saline. Place a small foam pad over the wound. Change every 3 days and prn. Monitor the wound area(s) for worsening of condition such  as: Signs/symptoms of infection,  Increase in size,  Development of or worsening of odor, Development of pain, or increased pain at the affected locations. Notify the medical team if any of these develop.  Thank you for the consult. Discussed plan of care with the patient and bedside nurse. [place] nurse will not follow at this time. Please re-consult the [place] team if needed.  Val Riles, RN, MSN, CWOCN, CNS-BC, pager [phone]         Bowel mgmt: continent LBM 11/23 Bladder mgmt: little urine Diabetic mgmt yes pta   Previous Home Environment Living Arrangements: Spouse/significant other  Lives With: Spouse Available Help at Discharge: Family, Available PRN/intermittently Type of Home: House Home Layout: (basement, main level and then upstairs second level) Alternate Level Stairs-Rails: Left Alternate Level Stairs-Number of Steps: 2 flights of 7 each Home Access: Stairs to enter Entrance Stairs-Rails: Right Entrance Stairs-Number of Steps: 2 Bathroom Shower/Tub: Optometrist: Yes How Accessible: Accessible via walker [place]: No Additional Comments: 1/2 bath main level, no bedroom on main level  Discharge Living Setting Plans for Discharge Living Setting: Patient's home, Lives with (comment)(wife) Type of Home at Discharge: House Discharge Home Layout: 1/2 bath on main level, Bed/bath upstairs, Multi-level Alternate Level Stairs-Rails: Left Alternate Level Stairs-Number of Steps: 2 flights of 7 each Discharge Home Access: Stairs to enter Entrance Stairs-Rails: Right Entrance Stairs-Number of Steps: 2 Discharge Bathroom Shower/Tub: Tub/shower unit(second level) Discharge Bathroom Toilet: Standard Discharge Bathroom Accessibility: Yes How Accessible: Accessible via walker Does the patient have any problems obtaining your medications?: No  Social/Family/Support Systems Patient Roles:  Spouse Contact Information: wife, [name] Anticipated Caregiver: wife; she works days Anticipated Ambulance person Information: see above Ability/Limitations of Caregiver: wife may take some FMLA Caregiver Availability: Intermittent Discharge Plan Discussed with Primary Caregiver: Yes Is Caregiver In Agreement with Plan?: Yes Does Caregiver/Family have Issues with Lodging/Transportation while Pt is in Rehab?: No  Goals/Additional Needs Patient/Family Goal for Rehab: Mod I to supervision with PT, possible w/c level, superivsion to min OT Expected length of stay: ELOS 7 to 10 days Dietary Needs: Renal diet Special Service Needs: Patietn sees Dr. Dellia Nims for outpt wound care weekly debridement on right foor Pt/Family Agrees to Admission and willing to participate: Yes Program Orientation Provided & Reviewed with Pt/Caregiver Including Roles  & Responsibilities: Yes  Decrease burden of Care through IP rehab admission: n/a  Possible need for SNF placement upon discharge: not anticiapted  Patient Condition: This patient's condition remains as documented in the consult dated 11/21/2018, in which the Rehabilitation Physician determined and documented that the patient's condition is appropriate for intensive rehabilitative care in an inpatient rehabilitation facility. Will admit to inpatient rehab today.  Preadmission Screen Completed By:  Cleatrice Burke, 11/22/2018 9:06 AM ______________________________________________________________________   Discussed status with Dr. Posey Pronto on 11/22/2018 at  0906 and received telephone approval for admission today.  Admission Coordinator:  Cleatrice Burke, time 7412 Date 11/22/2018           Cosigned by: Jamse Arn, MD at 11/22/2018 11:52 AM  Revision History

## 2018-11-22 NOTE — Progress Notes (Addendum)
[facility] Progress Note   Dialysis Orders: [facility] MWF 4.25h500/800EDW 95.5kg 2K/2.25Ca UFP2 AVF No Heparin -Hectorol 3 mcg IV TIW -Parsabiv 7.5 mg IV TIW Assessment/Plan:   1. MRSA bacteremia - most likely 2/2 Chronic bilateral foot wounds (had prior multiple MRSA wound cultures in Sept/Oct)  Hx osteomyelitis of L foot. [place] Sheer at MAUQ33/35 +MRSA. Repeat [facility] 11/22 neg final On vanc- last dose 11/24/cefepime d/c . S/P L BKA 11/20/18. Intraop TEE neg for endocarditis - ID rec Vanc through 12/9 = 14 days Vanc level 38 11/26 2. ESRD -MWF. HD 11/26 net UF 2.5 L - tells me system clotted - no heparin but normally no heparin- watch trends - next HD Friday  3. BP/volume - On midodrine as outpatient. Monitor.lower EDW due to amputation and lowering volume - post wt 91.3 11/26  4. Anemia - hgb10.6.Follow trends. Not currently on ESA 5. Metabolic bone disease -Continue Hectorol/Auryxia binder. Phos 3.1 decrease binder dose.No Parsabiv here. 6. Nutrition -Albumin 2.2 in setting of osteomyelitis.Renal diet/vitamins add prostat. 7. NICM EF 55-60% in 02/2018  8. PAD - s/p L tibial angioplasty/stenting 04/2018. Followed Dr. Scot Dock. On Plavix 9. DM T2 10. Wounds R plantar neuropathic ulcer - no osteo on MRI -WOC for R heel.  11. Disp - anticipate transfer to [place], [place] [phone] 11/22/2018,9:44 AM  LOS: 5 days   Pt seen, examined and agree w A/P as above.  Kelly Splinter MD [facility] pager [phone]   11/22/2018, 11:29 AM    Subjective:   C/o some leg pain - had BKA redressed today  Objective Vitals:   11/21/18 2121 11/21/18 2249 11/22/18 0432 11/22/18 0927  BP: (!) 105/58  106/60 106/67  Pulse: 77  72 73  Resp: 18  18 18   Temp: 98.1 F (36.7 C)  98.2 F (36.8 C) 98.4 F (36.9 C)  TempSrc: Oral  Oral Oral  SpO2: 99%  100%   Weight:  101 kg    Height:       Physical  Exam General: sitting in chair - NAD Heart: RRR Lungs: no rales Abdomen: soft NT + BS Extremities: right LE no edema, left BKA in ACE wrap Dialysis Access:  Left upper AVF + bruit   Additional Objective Labs: Basic Metabolic Panel: Recent Labs  Lab 11/18/18 0622 11/19/18 0556 11/20/18 1515 11/21/18 0704  NA 134* 136 137 132*  K 3.9 4.0 4.6 5.1  CL 93* 98  --  97*  CO2 30 29  --  25  GLUCOSE 164* 189* 109* 255*  BUN 30* 23  --  36*  CREATININE 9.95* 7.42*  --  8.13*  CALCIUM 7.7* 8.0*  --  8.2*  PHOS  --  3.1  --   --    Liver Function Tests: Recent Labs  Lab 11/17/18 1346 11/19/18 0556  AST 49*  --   ALT 37  --   ALKPHOS 54  --   BILITOT 0.9  --   PROT 8.4*  --   ALBUMIN 2.8* 2.2*   No results for input(s): LIPASE, AMYLASE in the last 168 hours. CBC: Recent Labs  Lab 11/17/18 1346 11/18/18 0622 11/19/18 0556 11/20/18 1515 11/21/18 0704  WBC 9.4 6.2 7.7  --  11.8*  NEUTROABS 6.4  --   --   --   --   HGB 11.3* 9.6* 10.2* 11.9* 10.6*  HCT 36.3* 30.7* 32.5* 35.0* 34.8*  MCV 90.8 90.0 90.3  --  90.6  PLT 278 260 245  --  331   Blood Culture    Component Value Date/Time   SDES BLOOD RIGHT HAND 11/17/2018 1348   SPECREQUEST  11/17/2018 1348    BOTTLES DRAWN AEROBIC AND ANAEROBIC Blood Culture adequate volume   CULT  11/17/2018 1348    NO GROWTH 5 DAYS Performed at [facility], [place] [address], [place], [place] [address]    REPTSTATUS 11/22/2018 FINAL 11/17/2018 1348    Cardiac Enzymes: No results for input(s): CKTOTAL, CKMB, CKMBINDEX, TROPONINI in the last 168 hours. CBG: Recent Labs  Lab 11/21/18 0724 11/21/18 1115 11/21/18 1831 11/21/18 2119 11/22/18 0755  GLUCAP 239* 188* 162* 195* 160*   Iron Studies: No results for input(s): IRON, TIBC, TRANSFERRIN, FERRITIN in the last 72 hours. Lab Results  Component Value Date   INR 1.15 05/10/2018   INR 1.19 05/17/2017   INR 1.33 11/27/2014   Studies/Results: No results  found. Medications: . sodium chloride    . sodium chloride    . sodium chloride     . atorvastatin  40 mg Oral QHS  . Chlorhexidine Gluconate Cloth  6 each Topical Daily  . doxercalciferol  3 mcg Intravenous Q M,W,F-HD  . feeding supplement (PRO-STAT SUGAR FREE 64)  30 mL Oral BID  . ferric citrate  210 mg Oral TID WC  . heparin  5,000 Units Subcutaneous Q8H  . insulin aspart  0-9 Units Subcutaneous TID WC  . insulin aspart protamine- aspart  5 Units Subcutaneous BID WC  . multivitamin with minerals  1 tablet Oral Q supper  . mupirocin ointment  1 application Nasal BID  . primidone  50 mg Oral QHS  . senna-docusate  2 tablet Oral BID  . timolol  1 drop Left Eye BID  . vancomycin variable dose per unstable renal function (pharmacist dosing)   Does not apply See admin instructions

## 2018-11-22 NOTE — Progress Notes (Signed)
Subjective: 2 Days Post-Op Procedure(s) (LRB): AMPUTATION BELOW KNEE (Left) TRANSESOPHAGEAL ECHOCARDIOGRAM (TEE) (N/A) Patient reports pain as moderate.  Taking by mouth okay.  Objective: Vital signs in last 24 hours: Temp:  [98.1 F (36.7 C)-98.6 F (37 C)] 98.2 F (36.8 C) (11/27 0432) Pulse Rate:  [71-88] 72 (11/27 0432) Resp:  [16-18] 18 (11/27 0432) BP: (99-143)/(58-82) 106/60 (11/27 0432) SpO2:  [95 %-100 %] 100 % (11/27 0432) Weight:  [91.3 kg-101 kg] 101 kg (11/26 2249)  Intake/Output from previous day: 11/26 0701 - 11/27 0700 In: 360 [P.O.:360] Out: 2500  Intake/Output this shift: No intake/output data recorded.  Recent Labs    11/20/18 1515 11/21/18 0704  HGB 11.9* 10.6*   Recent Labs    11/20/18 1515 11/21/18 0704  WBC  --  11.8*  RBC  --  3.84*  HCT 35.0* 34.8*  PLT  --  331   Recent Labs    11/20/18 1515 11/21/18 0704  NA 137 132*  K 4.6 5.1  CL  --  97*  CO2  --  25  BUN  --  36*  CREATININE  --  8.13*  GLUCOSE 109* 255*  CALCIUM  --  8.2*   No results for input(s): LABPT, INR in the last 72 hours. Lower extremity exam: Intact pulses distally Dorsiflexion/Plantar flexion intact Incision: dressing C/D/I No cellulitis present Compartment soft Wound benign.  Wound edges all viable.  Penrose drains intact x2.   Assessment/Plan: 2 Days Post-Op Procedure(s) (LRB): AMPUTATION BELOW KNEE (Left) TRANSESOPHAGEAL ECHOCARDIOGRAM (TEE) (N/A)  Plan: Left BKA dressing changed and 2 Penrose drains pulled. Compressive amputation wrap applied. We will continue physical therapy.  Work on range of motion of the knee. We will need dressing changed every 3 days with a new dry dressing. To CIR when arrangements can be made.  Hopefully today.    [name] 11/22/2018, 8:35 AM

## 2018-11-22 NOTE — Discharge Summary (Signed)
Discharge Summary  [name] SPROLES Sr. HWE:[id number] DOB: [birth year]  PCP: [name], MD  Admit date: 11/17/2018 Discharge date: 11/22/2018  Time spent: 35 minutes  Recommendations for Outpatient Follow-up:  1. Continue physical therapy at inpatient rehab 2. Take your medications as prescribed 3. Fall precautions 4. Follow-up with infectious disease outpatient 5. Follow-up with orthopedic surgery outpatient  Discharge Diagnoses:  Active Hospital Problems   Diagnosis Date Noted  . MRSA bacteremia 11/21/2018  . Leukocytosis   . Tobacco abuse   . Post-operative pain   . Sepsis ([place]) 05/10/2018  . Diabetic ulcer of foot associated with diabetes mellitus due to underlying condition, with necrosis of muscle ([place]) 01/27/2018  . ESRD on hemodialysis ([place])   . Type 2 diabetes mellitus with left diabetic foot ulcer ([place]) 01/21/2017  . Osteomyelitis of left foot ([place]) 09/11/2016  . Anemia due to chronic blood loss 11/23/2014  . HTN (hypertension) 01/28/2012    Resolved Hospital Problems   Diagnosis Date Noted Date Resolved  . Osteomyelitis of toe of right foot ([place]) 01/27/2018 02/02/2018    Discharge Condition: Stable  Diet recommendation: Renal dialysis diet  Vitals:   11/22/18 0432 11/22/18 0927  BP: 106/60 106/67  Pulse: 72 73  Resp: 18 18  Temp: 98.2 F (36.8 C) 98.4 F (36.9 C)  SpO2: 100%     History of present illness:  67 y.o.male with past medical history of type II diabetes, end-stage renal disease on hemodialysis Monday Wednesday on Friday, hypertension, cardiomyopathy, severe peripheral vascular disease with multiple toe amputationsbilaterally. Bilateral foot ulcer, sent by hemodialysis center for MRSA positive blood cultures and known chronic LEFT  osteomyelitis and chronic right lower extremity wound. 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   11/22/2018: Patient seen and examined at his bedside.  He has no new complaints.  He will continue physical therapy at inpatient rehab.  On the day of discharge, the patient was hemodynamically stable.  He will need to follow-up with infectious disease and orthopedic surgery post hospitalization.  Hospital Course:  Principal Problem:   MRSA bacteremia Active Problems:   HTN (hypertension)   Anemia due to chronic blood loss   Osteomyelitis of left foot ([facility])   Type 2 diabetes mellitus with left diabetic foot ulcer ([place])   ESRD on hemodialysis ([place])   Diabetic ulcer of foot associated with diabetes mellitus due to underlying condition, with necrosis of muscle ([facility])   Sepsis ([place])   Leukocytosis   Tobacco abuse   Post-operative pain  MRSA bacteremia 2/2 left foot/osteomyelitis - He had blood culture positive for staph at hemodialysis center 11/14/2018, admitted for further work-up, following repeat blood cultures 11/17/2018 negative x 5 days..  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 [name]. -Right foot MRI with no evidence of osteomyelitis, but has chronic wound, wound care consulted. Dressing changes done by nursing. -2D echo with no significant valvular disease,as well TEE with no vegetation. -ID following and recommends return to ID clinic on 12/12/18 at 11:15 am for evaluation off antibiotics. Will need 2 weeks of antibiotics total -CIR consulted; pt will be transferred to inpt rehab.  MRSA bacteremia Per ID: TEE intraop was negative for endocarditis. 14 days of therapy with vancomycin via HD treamtment through 12/09/019 from his source control (amputation). He has HD graft and would benefit from clearance blood cultures following treatment - appointment made.  Left foot  infection/chronic osteomyelitis -Management as stated above  Chronic right foot wound -Continue with wound care, MRI with no evidence of osteomyelitis -Will need ongoing offloading/wound care, diabetes  management to help heal.  ESRD -Renal following , HD per renal  Hypertension -On Midodrin  PAD  - s/p L tibial angioplasty/stenting 04/2018. Followed Dr. Scot Dock.   Diabetes mellitus type 2 -Continue with insulin sliding scale, CBG overall acceptable, A1c is 7.7  Anemia Of chronic kidney disease -Hemoglobin 10.6 ,continue to monitor closely    Code Status : Full  Consults  :  Renal/ID/CIR  Procedures  : Left BKA by Dr. [name] 11/20/2018, TEE 11/20/2018   Discharge Exam: BP 106/67 (BP Location: Right Arm)   Pulse 73   Temp 98.4 F (36.9 C) (Oral)   Resp 18   Ht '6\' 1"'  (1.854 m)   Wt 101 kg   SpO2 100%   BMI 29.38 kg/m  . General: 67 y.o. year-old male well developed well nourished in no acute distress.  Alert and oriented x3. . Cardiovascular: Regular rate and rhythm with no rubs or gallops.  No thyromegaly or JVD noted.   Marland Kitchen Respiratory: Clear to auscultation with no wheezes or rales. Good inspiratory effort. . Abdomen: Soft nontender nondistended with normal bowel sounds x4 quadrants. . Musculoskeletal: Left BKA in surgical dressing. Chronic right plantar region of foot wound. No lower extremity edema.  Marland Kitchen Psychiatry: Mood is appropriate for condition and setting  Discharge Instructions You were cared for by a hospitalist during your hospital stay. If you have any questions about your discharge medications or the care you received while you were in the hospital after you are discharged, you can call the unit and asked to speak with the hospitalist on call if the hospitalist that took care of you is not available. Once you are discharged, your primary care physician will handle any further medical issues. Please note that NO REFILLS for any discharge medications will be authorized once you are discharged, as it is imperative that you return to your primary care physician (or establish a relationship with a primary care physician if you do not have one) for your  aftercare needs so that they can reassess your need for medications and monitor your lab values.   Allergies as of 11/22/2018      Reactions   Penicillins Hives, Swelling, Other (See Comments)   Ankle swelling Has patient had a PCN reaction causing immediate rash, facial/tongue/throat swelling, SOB or lightheadedness with hypotension: Yes Has patient had a PCN reaction causing severe rash involving mucus membranes or skin necrosis: No Has patient had a PCN reaction that required hospitalization: No Has patient had a PCN reaction occurring within the last 10 years: No If all of the above answers are "NO", then may proceed with Cephalosporin use.      Medication List    STOP taking these medications   ciprofloxacin 500 MG tablet Commonly known as:  CIPRO   HYDROcodone-acetaminophen 5-325 MG tablet Commonly known as:  NORCO/VICODIN     TAKE these medications   acetaminophen 325 MG tablet Commonly known as:  TYLENOL Take 650 mg by mouth every 6 (six) hours as needed (pain).   AURYXIA 1 GM 210 MG(Fe) tablet Generic drug:  ferric citrate Take 630 mg by mouth 3 (three) times daily with meals.   clopidogrel 75 MG tablet Commonly known as:  PLAVIX Take 1 tablet (75 mg total) by mouth daily. What changed:  when to take this  glucose monitoring kit monitoring kit 1 each by Does not apply route 4 (four) times daily - after meals and at bedtime. 1 month Diabetic Testing Supplies for QAC-QHS accuchecks.Any brand OK   ibuprofen 200 MG tablet Commonly known as:  ADVIL,MOTRIN Take 400 mg by mouth daily as needed for headache (pain).   insulin NPH-regular Human (70-30) 100 UNIT/ML injection Inject 5-6 Units into the skin 2 (two) times daily with a meal. Sliding Scale - based on CBG   Insulin Syringe-Needle U-100 25G X 1" 1 ML Misc Any brand, for 4 times a day insulin SQ, 1 month supply.   LIPITOR 40 MG tablet Generic drug:  atorvastatin Take 40 mg by mouth at bedtime.     midodrine 5 MG tablet Commonly known as:  PROAMATINE Take 10 mg by mouth 2 (two) times daily with a meal. What changed:  Another medication with the same name was removed. Continue taking this medication, and follow the directions you see here.   multivitamin with minerals tablet Take 1 tablet by mouth daily with supper.   mupirocin ointment 2 % Commonly known as:  BACTROBAN Apply 1 application topically daily as needed (rassh).   primidone 50 MG tablet Commonly known as:  MYSOLINE Take 0.5 tablets (25 mg total) by mouth at bedtime. What changed:  how much to take   timolol 0.5 % ophthalmic solution Commonly known as:  TIMOPTIC Place 1 drop into the left eye 2 (two) times daily.      Allergies  Allergen Reactions  . Penicillins Hives, Swelling and Other (See Comments)    Ankle swelling Has patient had a PCN reaction causing immediate rash, facial/tongue/throat swelling, SOB or lightheadedness with hypotension: Yes Has patient had a PCN reaction causing severe rash involving mucus membranes or skin necrosis: No Has patient had a PCN reaction that required hospitalization: No Has patient had a PCN reaction occurring within the last 10 years: No If all of the above answers are "NO", then may proceed with Cephalosporin use.   Follow-up Information    Dorna Leitz, MD. Schedule an appointment as soon as possible for a visit in 2 weeks.   Specialty:  Orthopedic Surgery Contact information: [place] [address] [place] for Infectious Disease Follow up on 12/12/2018.   Specialty:  Infectious Diseases Why:  Appointment with Colletta Maryland, NP @ 11:15 am. Kindly call to reschedule if unable to keep appointment.  Contact information: [place], [place] [id number] [place]       [name], MD. Call in 1 day(s).   Specialty:  Family Medicine Why:  please call for a  post hospital follow up appointment. Contact information: [place] [address] [phone]            The results of significant diagnostics from this hospitalization (including imaging, microbiology, ancillary and laboratory) are listed below for reference.    Significant Diagnostic Studies: Mr Foot Right Wo Contrast  Result Date: 11/19/2018 CLINICAL DATA:  Right foot wound. Bacteremia. Concern for osteomyelitis. End-stage renal disease. EXAM: MRI OF THE RIGHT FOREFOOT WITHOUT CONTRAST TECHNIQUE: Multiplanar, multisequence MR imaging of the right forefoot was performed. No intravenous contrast was administered. COMPARISON:  Previous MRI 05/12/2018.  Radiographs 01/12/2018. FINDINGS: Bones/Joint/Cartilage Multiple prior amputations are again noted, including amputations of the 2nd and 4th toes, the distal 3rd toe and the distal 5th metatarsal. These all appear stable.  There is new extensive signal abnormality traversing the base of the 3rd metatarsal, most consistent with a stress fracture. There is associated bone marrow and surrounding soft tissue edema as well as cortical thickening. The additional metatarsal bases are intact and normally aligned. There are stable moderate degenerative changes at the 1st metatarsophalangeal joint and it sesamoids. The tarsal bones appears stable. There are no significant joint effusions. Ligaments Intact Lisfranc ligament. Muscles and Tendons Mild muscular atrophy.  No significant tenosynovitis. Soft tissues As above, there is edema within the soft tissue surrounding the 3rd metatarsal base. The previously demonstrated soft tissue ulceration along the plantar aspect of the forefoot has improved. No focal fluid collection or generalized inflammation identified. IMPRESSION: 1. New abnormality involving the base of the 3rd metatarsal, most consistent with a stress fracture. 2. No evidence of recurrent osteomyelitis. 3. Previously demonstrated  inflammatory changes involving the plantar forefoot have improved. No evidence of soft tissue abscess. 4. Multiple previous amputations. Chronic degenerative changes at the 1st metatarsophalangeal joint. Electronically Signed   By: Richardean Sale M.D.   On: 11/19/2018 16:49   Mr Foot Left Wo Contrast  Result Date: 11/19/2018 CLINICAL DATA:  Sepsis EXAM: MRI OF THE LEFT HINDFOOT WITHOUT CONTRAST Osteomyelitis. Bony destruction of the base of the fourth metatarsal compatible with osteomyelitis. TECHNIQUE: Multiplanar, multisequence MR imaging of the ankle was performed. No intravenous contrast was administered. COMPARISON:  08/31/2018 FINDINGS: TENDONS Peroneal: Prominent peroneus longus tendinopathy posterior to the lateral malleolus. Poor definition of the distal peroneus longus tendon with at least partial tearing. The peroneus brevis is discontinuous as expected given that the fifth digit is absent. Posteromedial: Distal tibialis posterior tendinopathy. Anterior: Unremarkable Achilles: Unremarkable Plantar Fascia: Thickened medial band plantar fascia. LIGAMENTS Lateral: Thickened anterior inferior tibiofibular ligament. Medial: Irregularity and likely erosion along the lateral malleolar attachment of the deep tibiotalar portion of the deltoid ligament. Thickened superomedial portion of the spring ligament. Thickened Lisfranc ligament. CARTILAGE Ankle Joint: Degenerative chondral thinning. Subtalar Joints/Sinus Tarsi: Degenerative chondral thinning. Bones: Significant progression of destructive osseous edema involving the cuboid and lateral cuneiform. Significant increase in edema suspicious for osteomyelitis in the middle cuneiform. Worsening of arthropathy at the articulation between the navicular and medial cuneiform, osteomyelitis not excluded. There is abnormal edema in the bases of the second and third metatarsals suspicious for osteomyelitis. Bony destruction of the base of the fourth metatarsal,  compatible with osteomyelitis. Other: Subcutaneous edema along the lateral ankle and tracking especially in the plantar foot. Wound underneath the cuboid, likely ulceration. IMPRESSION: 1. Active osteomyelitis involving the cuboid, lateral cuneiform, and base of the fourth metatarsal. Probable osteomyelitis in the middle cuneiform and bases of the second and third metatarsals. Most of the edema at the articulation between the navicular and medial cuneiform is likely degenerative. 2. Suspected ulceration adjacent to the cuboid. 3. Absent fifth ray with discontinuous peroneus brevis tendon. Prominent thickening and irregularity and at least partial tearing of the peroneus longus. 4. Distal tibialis posterior tendinopathy. 5. Thickened medial band of the plantar fascia, possibly from plantar fasciitis. 6. Irregularity along the lateral malleolar attachment of the deep tibiotalar portion of the deltoid ligament. Thickened superomedial portion of the spring ligament. Electronically Signed   By: Van Clines M.D.   On: 11/19/2018 21:38   Dg Chest Port 1 View  Result Date: 11/17/2018 CLINICAL DATA:  67 year old male with shortness of breath, weakness and sepsis EXAM: PORTABLE CHEST 1 VIEW COMPARISON:  Prior chest x-ray 05/10/2018 FINDINGS: The lungs are clear  and negative for focal airspace consolidation, pulmonary edema or suspicious pulmonary nodule. No pleural effusion or pneumothorax. Cardiac and mediastinal contours are within normal limits. Atherosclerotic plaque present in the transverse aorta. No acute fracture or lytic or blastic osseous lesions. The visualized upper abdominal bowel gas pattern is unremarkable. IMPRESSION: No acute cardiopulmonary process. Aortic Atherosclerosis (ICD10-170.0) Electronically Signed   By: Jacqulynn Cadet M.D.   On: 11/17/2018 14:08   Vas [place] Burnard Bunting With/wo Tbi  Result Date: 11/08/2018 LOWER EXTREMITY DOPPLER STUDY Indications: Peripheral artery disease. High Risk  Factors: Hypertension, insulin dependent diabetes mellitus, Diabetes,                    past history of smoking.  Vascular               Left AVF Interventions:         Angioplasty and stent of left tibioperoneal trunk                        05/11/2018. Limitations: Patient refused bandage wrappings of the right lower extremity to              be removed. Unable to perform right ABIs. Performing Technologist: Ronal Fear RVS, RCS  Examination Guidelines: A complete evaluation includes at minimum, Doppler waveform signals and systolic blood pressure reading at the level of bilateral brachial, anterior tibial, and posterior tibial arteries, when vessel segments are accessible. Bilateral testing is considered an integral part of a complete examination. Photoelectric Plethysmograph (PPG) waveforms and toe systolic pressure readings are included as required and additional duplex testing as needed. Limited examinations for reoccurring indications may be performed as noted.  ABI Findings: +--------+------------------+-----+--------+--------+ Right   Rt Pressure (mmHg)IndexWaveformComment  +--------+------------------+-----+--------+--------+ [id number]                                     +--------+------------------+-----+--------+--------+ +----+------------------+-----+--------+-------+ LeftLt Pressure (mmHg)IndexWaveformComment +----+------------------+-----+--------+-------+ PTA 173               1.10 biphasic        +----+------------------+-----+--------+-------+ DP  138               0.88 biphasic        +----+------------------+-----+--------+-------+ +-------+-----------+-----------+------------+------------+ ABI/TBIToday's ABIToday's TBIPrevious ABIPrevious TBI +-------+-----------+-----------+------------+------------+ Right                        1.43        0.41         +-------+-----------+-----------+------------+------------+ Left   1.10       0.88        1.21        0.32         +-------+-----------+-----------+------------+------------+ Arterial wall calcification precludes accurate ankle pressures and ABIs. Circumferential wall calcification observed on 2D imaging.  Summary: Left: Although ankle brachial indices are within normal limits (0.95-1.29), arterial Doppler waveforms at the ankle suggest some component of arterial occlusive disease.  *See table(s) above for measurements and observations.  Electronically signed by Deitra Mayo MD on 11/08/2018 at 10:00:28 AM.   Final    Vas [place] Lower Extremity Arterial Duplex  Result Date: 11/08/2018 LOWER EXTREMITY ARTERIAL DUPLEX STUDY Indications: Peripheral artery disease. High Risk Factors: Hypertension, insulin dependent diabetes mellitus, Diabetes.  Vascular               Left AVF Interventions:  Angioplasty and stent of left tibioperoneal trunk                        05/11/2018. Current ABI:           R=not performed, L=1.10 Performing Technologist: Ronal Fear RVS, RCS  Examination Guidelines: A complete evaluation includes B-mode imaging, spectral Doppler, color Doppler, and power Doppler as needed of all accessible portions of each vessel. Bilateral testing is considered an integral part of a complete examination. Limited examinations for reoccurring indications may be performed as noted.  Left Duplex Findings: +-----------+--------+-----+--------+--------+--------+            PSV cm/sRatioStenosisWaveformComments +-----------+--------+-----+--------+--------+--------+ CFA Distal 130                  biphasic         +-----------+--------+-----+--------+--------+--------+ DFA        99                   biphasic         +-----------+--------+-----+--------+--------+--------+ SFA Prox   122                  biphasic         +-----------+--------+-----+--------+--------+--------+ SFA Mid    149                  biphasic          +-----------+--------+-----+--------+--------+--------+ SFA Distal 105                  biphasic         +-----------+--------+-----+--------+--------+--------+ POP Prox   129                  biphasic         +-----------+--------+-----+--------+--------+--------+ POP Distal 151                  biphasic         +-----------+--------+-----+--------+--------+--------+ ATA Prox   80                   biphasic         +-----------+--------+-----+--------+--------+--------+ ATA Mid    64                   biphasic         +-----------+--------+-----+--------+--------+--------+ PTA Prox   166                  biphasic         +-----------+--------+-----+--------+--------+--------+ PTA Mid    156                  biphasic         +-----------+--------+-----+--------+--------+--------+ PTA Distal 144                  biphasic         +-----------+--------+-----+--------+--------+--------+ PERO Prox                       absent           +-----------+--------+-----+--------+--------+--------+ PERO Mid                        absent           +-----------+--------+-----+--------+--------+--------+ PERO Distal                     absent           +-----------+--------+-----+--------+--------+--------+  DP         58                   biphasic         +-----------+--------+-----+--------+--------+--------+  Summary: Left: Patent left lower extremity arterial system and left tibioperoneal artery stent without evidence of hemodynamically significant stenosis. Circumferential wall calcification is observed throughout the left lower extremity.  See table(s) above for measurements and observations. Electronically signed by Deitra Mayo MD on 11/08/2018 at 9:42:03 AM.    Final     Microbiology: Recent Results (from the past 240 hour(s))  Blood Culture (routine x 2)     Status: None   Collection Time: 11/17/18  1:46 PM  Result Value Ref  Range Status   Specimen Description BLOOD RIGHT ANTECUBITAL  Final   Special Requests   Final    BOTTLES DRAWN AEROBIC AND ANAEROBIC Blood Culture adequate volume   Culture   Final    NO GROWTH 5 DAYS Performed at [facility], [address], [place], [place] [address]    Report Status 11/22/2018 FINAL  Final  Blood Culture (routine x 2)     Status: None   Collection Time: 11/17/18  1:48 PM  Result Value Ref Range Status   Specimen Description BLOOD RIGHT HAND  Final   Special Requests   Final    BOTTLES DRAWN AEROBIC AND ANAEROBIC Blood Culture adequate volume   Culture   Final    NO GROWTH 5 DAYS Performed at [facility], [place] [address] [place], [place] [address]    Report Status 11/22/2018 FINAL  Final  Surgical pcr screen     Status: Abnormal   Collection Time: 11/20/18  1:10 AM  Result Value Ref Range Status   MRSA, PCR NEGATIVE NEGATIVE Final   Staphylococcus aureus POSITIVE (A) NEGATIVE Final    Comment: RESULT CALLED TO, READ BACK BY AND VERIFIED WITH: RN T. Ellender Hose [phone] '@[id number]'  THANEY (NOTE) The Xpert SA Assay (FDA approved for NASAL specimens in patients 26 years of age and older), is one component of a comprehensive surveillance program. It is not intended to diagnose infection nor to guide or monitor treatment.      Labs: Basic Metabolic Panel: Recent Labs  Lab 11/17/18 1346 11/18/18 0622 11/19/18 0556 11/20/18 1515 11/21/18 0704  NA 136 134* 136 137 132*  K 3.5 3.9 4.0 4.6 5.1  CL 92* 93* 98  --  97*  CO2 '27 30 29  ' --  25  GLUCOSE 139* 164* 189* 109* 255*  BUN 23 30* 23  --  36*  CREATININE 8.28* 9.95* 7.42*  --  8.13*  CALCIUM 8.5* 7.7* 8.0*  --  8.2*  PHOS  --   --  3.1  --   --    Liver Function Tests: Recent Labs  Lab 11/17/18 1346 11/19/18 0556  AST 49*  --   ALT 37  --   ALKPHOS 54  --   BILITOT 0.9  --   PROT 8.4*  --   ALBUMIN 2.8* 2.2*   No results for input(s): LIPASE, AMYLASE in the last 168 hours. No results for  input(s): AMMONIA in the last 168 hours. CBC: Recent Labs  Lab 11/17/18 1346 11/18/18 0622 11/19/18 0556 11/20/18 1515 11/21/18 0704  WBC 9.4 6.2 7.7  --  11.8*  NEUTROABS 6.4  --   --   --   --   HGB 11.3* 9.6* 10.2* 11.9* 10.6*  HCT 36.3* 30.7* 32.5* 35.0* 34.8*  MCV 90.8 90.0 90.3  --  90.6  PLT 278 260 245  --  331   Cardiac Enzymes: No results for input(s): CKTOTAL, CKMB, CKMBINDEX, TROPONINI in the last 168 hours. BNP: BNP (last 3 results) No results for input(s): BNP in the last 8760 hours.  ProBNP (last 3 results) No results for input(s): PROBNP in the last 8760 hours.  CBG: Recent Labs  Lab 11/21/18 0724 11/21/18 1115 11/21/18 1831 11/21/18 2119 11/22/18 0755  GLUCAP 239* 188* 162* 195* 160*       Signed:  Kayleen Memos, MD [facility] 11/22/2018, 11:43 AM

## 2018-11-22 NOTE — IPOC Note (Signed)
Overall Plan of Care [facility]) Patient Details Name: [name] SUESS Sr. MRN: [id number] DOB: 07/25/1951  Admitting Diagnosis: Left BKA  Hospital Problems: Active Problems:   Unilateral complete BKA, left, initial encounter ([place])   Postoperative pain   Type 2 diabetes mellitus with peripheral neuropathy (HCC)   Bacteremia   Anemia of chronic disease   S/P unilateral BKA (below knee amputation), left (HCC)   Muscle spasms of both lower extremities   [redacted] weeks gestation of pregnancy   Diabetes mellitus type 2 in nonobese ([place])   Drug induced constipation   Elevated blood pressure reading     Functional Problem List: Nursing Skin Integrity, Endurance, Medication Management, Motor, Pain  PT Balance, Endurance, Motor, Pain, Sensory  OT Balance, Endurance, Pain, Safety  SLP    TR         Basic ADL's: OT Grooming, Dressing, Bathing, Toileting     Advanced  ADL's: OT       Transfers: PT Bed Mobility, Bed to Chair, Car, Patent attorney, Agricultural engineer: PT Ambulation, Emergency planning/management officer, Stairs     Additional Impairments: OT None  SLP        TR      Anticipated Outcomes Item Anticipated Outcome  Self Feeding    Swallowing      Basic self-care  Mod I  Toileting  Mod I   Bathroom Transfers Supervision  Bowel/Bladder  managed mod I   Transfers  mod I  Locomotion  mod I w/c, supervision gait  Communication     Cognition     Pain  less than 4   Safety/Judgment  mod I    Therapy Plan: PT Intensity: Minimum of 1-2 x/day ,45 to 90 minutes PT Frequency: 5 out of 7 days PT Duration Estimated Length of Stay: 7-10 days OT Intensity: Minimum of 1-2 x/day, 45 to 90 minutes OT Frequency: 5 out of 7 days OT Duration/Estimated Length of Stay: 7-10 days      Team Interventions: Nursing Interventions Pain Management, Medication Management, Skin Care/Wound Management, Disease Management/Prevention, Psychosocial Support  PT interventions  Ambulation/gait training, Discharge planning, Functional mobility training, Therapeutic Activities, Balance/vestibular training, Neuromuscular re-education, Therapeutic Exercise, Wheelchair propulsion/positioning, DME/adaptive equipment instruction, Pain management, Splinting/orthotics, UE/LE Strength taining/ROM, Academic librarian, Barrister's clerk education, IT trainer, UE/LE Coordination activities  OT Interventions Training and development officer, Academic librarian, Discharge planning, Functional mobility training, Pain management, Patient/family education, Psychosocial support, Self Care/advanced ADL retraining, Skin care/wound managment, Therapeutic Activities, Therapeutic Exercise, UE/LE Strength taining/ROM, UE/LE Coordination activities, Wheelchair propulsion/positioning  SLP Interventions    TR Interventions    SW/CM Interventions Discharge Planning, Psychosocial Support, Patient/Family Education   Barriers to Discharge MD  Medical stability, Wound care, Hemodialysis and Weight bearing restrictions  Nursing Wound Care, Hemodialysis    PT Inaccessible home environment, Decreased caregiver support    OT      SLP      SW       Team Discharge Planning: Destination: PT-Home ,OT- Home , SLP-  Projected Follow-up: PT-Home health PT, OT-  Home health OT, SLP-  Projected Equipment Needs: PT-Wheelchair (measurements), Wheelchair cushion (measurements), OT- To be determined, SLP-  Equipment Details: PT- , OT-Likely 3-in-1 BSC, tub transfer bench Patient/family involved in discharge planning: PT- Patient,  OT-Patient, SLP-   MD ELOS: 5-8 days. Medical Rehab Prognosis:  Good Assessment: 67 year old male with history of T2DM, diabetic peripheral neuropathy with chronic bilateral diabetic foot ulcers, ESRD, HTN, PVD, osteomyelitis left foot positive for MRSA  being treated with vancomycin.  He was admitted on 11/17/2018 with malaise, fevers and left foot pain due to sepsis from staph  bacteremia.  Patient has refused amputation multiple times in the past and MRI of foot done revealing active osteomyelitis with edema.  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 [name].  ID following for input and recommended 14 total days of antibiotic therapy through 12/04/2018 as source of infection has been amputated.  Patient with resulting functional deficits with mobility, transfers, endurance, self-care.  We will set goals for Mod I with PT/OT.  See Team Conference Notes for weekly updates to the plan of care

## 2018-11-22 NOTE — Progress Notes (Signed)
Patient transferred to [place] rehab. AVS printed out and given to patient. Patient belongings with patient. Family at bedside.   Farley Ly RN

## 2018-11-22 NOTE — Progress Notes (Addendum)
Inpatient Rehabilitation Admissions Coordinator  I have a bed available to admit pt to inpt rehab today. I contacted Dr. Nevada Crane and will arrange d/c to CIR today. I will notify RN and SW and make the arrangements to admit today. I have notified Caryl Pina, in hemodialysis of planned inpt rehab admit.   Danne Baxter, RN, MSN Rehab Admissions Coordinator [phone] 11/22/2018 9:09 AM '

## 2018-11-22 NOTE — H&P (Signed)
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 is a 67 year old male with history of T2DM, diabetic peripheral neuropathy with chronic bilateral diabetic foot ulcers, ESRD, HTN, PVD, osteomyelitis left foot positive for MRSA being treated with vancomycin.  History taken from chart review, patient, and family.  He was admitted on 11/17/2018 with malaise, fevers and left foot pain due to sepsis from staph bacteremia.  Patient has refused amputation multiple times in the past and MRI of foot done revealing active osteomyelitis with edema.  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 [name].  ID following for input and recommended 14 total days of antibiotic therapy through 12/04/2018 as source of infection has been amputated.  Therapy initiated and patient noted to have functional deficits that was CIR recommended for follow-up therapy.   Review of Systems  Musculoskeletal: Positive for joint pain and myalgias.  Neurological: Positive for tremors (Left upper extremity baseline), sensory change (Baseline left upper extremity) and focal weakness (Left upper extremity baseline). Negative for speech change.  All other systems reviewed and are negative.     Past Medical History:  Diagnosis Date  . Cardiomyopathy ([place])   . Cataract    right eye  . Diabetes mellitus    type 2  . Diabetic foot ulcer ([place])   . Diabetic infection of right foot ([place])   . Diabetic peripheral neuropathy ([place])   . ESRD (end stage renal disease) ([place])    m-w-f [facility] [address]  . Glaucoma    left eye  . Headache   . HTN (hypertension)   . Osteomyelitis ([place])   . Peripheral vascular disease ([place])    critical limb ischemia    Past Surgical History:  Procedure Laterality Date  . ABDOMINAL AORTOGRAM N/A 05/04/2018   Procedure: ABDOMINAL AORTOGRAM;  Surgeon: Conrad [place], MD;  Location: [facility];  Service: Cardiovascular;  Laterality: N/A;  . AMPUTATION Left 11/20/2018   Procedure: AMPUTATION BELOW KNEE;  Surgeon: Dorna Leitz, MD;  Location: [facility];  Service: Orthopedics;  Laterality: Left;  . AMPUTATION TOE Right 01/21/2017   Procedure: AMPUTATION 2ND TOE RIGHT FOOT ;  Surgeon: Dorna Leitz, MD;  Location: [facility];  Service: Orthopedics;  Laterality: Right;  . AMPUTATION TOE Left 05/18/2017   Procedure: 5TH RAY AMPUTATION  and EXCISION CUBOID;  Surgeon: Dorna Leitz, MD;  Location: [facility];  Service: Orthopedics;  Laterality: Left;  . AMPUTATION TOE Bilateral 01/27/2018   Procedure: RIGHT FOOT 3RD TOE AMPUTATION WITH INCISIONAL WOUND DEBRIDEMENT. LEFT FOOT 2ND TOE AMPUTATION AND INCISIONAL WOUND DEBRIDEMENT.;  Surgeon: Dorna Leitz, MD;  [facility];  Service: Orthopedics;  Laterality: Bilateral;  . AV FISTULA PLACEMENT Left 11/27/2014   Procedure: ARTERIOVENOUS (AV) FISTULA CREATION;  Surgeon: Conrad Grenelefe, MD;  Location: [facility];  Service: Vascular;  Laterality: Left;  . CHOLECYSTECTOMY    . COLONOSCOPY N/A 11/28/2014   Procedure: COLONOSCOPY;  Surgeon: Ladene Artist, MD;  Location: [facility];  Service: Endoscopy;  Laterality: N/A;  . ESOPHAGOGASTRODUODENOSCOPY N/A 11/28/2014   Procedure: ESOPHAGOGASTRODUODENOSCOPY (EGD);  Surgeon: Ladene Artist, MD;  Location: [facility] ENDOSCOPY;  Service: Endoscopy;  Laterality: N/A;  . EYE SURGERY     retina and cataract left eye surgery  . I&D EXTREMITY  01/28/2012   Procedure: IRRIGATION AND DEBRIDEMENT EXTREMITY;  Surgeon: Paulene Floor, MD;  [facility];  Service: Orthopedics;  Laterality: Left;  irrigation and drainage left hand  . I&D EXTREMITY  01/30/2012   Procedure: IRRIGATION AND DEBRIDEMENT EXTREMITY;  Surgeon: Paulene Floor, MD;  [facility];  Service: Orthopedics;  Laterality: Left;  flexor teno synovectomy and extensor tenosynovectomy arthrosynovectomy wristjoint  . I&D EXTREMITY  02/01/2012   Procedure: IRRIGATION  AND DEBRIDEMENT EXTREMITY;  Surgeon: Paulene Floor, MD;  [facility];  Service: Orthopedics;  Laterality: Left;  Left Wrist, Hand and Forearm  . IRRIGATION AND DEBRIDEMENT FOOT Left 01/21/2017   Procedure: DEBRIDEMENT LEFT PLANTAR WOUND;  Surgeon: Dorna Leitz, MD;  Location: [place];  Service: Orthopedics;  Laterality: Left;  . Left 4th toe amputation    . LEFT AND RIGHT HEART CATHETERIZATION WITH CORONARY ANGIOGRAM N/A 11/26/2014   Procedure: LEFT AND RIGHT HEART CATHETERIZATION WITH CORONARY ANGIOGRAM;  Surgeon: Leonie Man, MD;  [facility];  Service: Cardiovascular;  Laterality: N/A;  . LOWER EXTREMITY ANGIOGRAPHY Bilateral 05/04/2018   Procedure: Lower Extremity Angiography;  Surgeon: Conrad [place], MD;  Location: [place] [facility];  Service: Cardiovascular;  Laterality: Bilateral;  . LOWER EXTREMITY ANGIOGRAPHY Left 05/11/2018   Procedure: LOWER EXTREMITY ANGIOGRAPHY;  Surgeon: Conrad Strawberry, MD;  Location: [place] [facility];  Service: Cardiovascular;  Laterality: Left;  . PERIPHERAL VASCULAR BALLOON ANGIOPLASTY Left 05/11/2018   Procedure: PERIPHERAL VASCULAR BALLOON ANGIOPLASTY;  Surgeon: Conrad [place], MD;  Location: [place] [facility];  Service: Cardiovascular;  Laterality: Left;  Posterior tibial  . PERIPHERAL VASCULAR INTERVENTION Left 05/11/2018   Procedure: PERIPHERAL VASCULAR INTERVENTION;  Surgeon: Conrad Amber, MD;  Location: [place] [facility];  Service: Cardiovascular;  Laterality: Left;  tibioperoneal trunk  . TEE WITHOUT CARDIOVERSION N/A 11/20/2018   Procedure: TRANSESOPHAGEAL ECHOCARDIOGRAM (TEE);  Surgeon: Jerline Pain, MD;  Location: [facility];  Service: Cardiovascular;  Laterality: N/A;  . TONSILLECTOMY      Family History  Problem Relation Age of Onset  . Heart disease Mother   . Diabetes Father   . Diabetes Brother   . Diabetes Sister   . Diabetes Brother   . Diabetes Brother     Social History: Married.  Independent prior to admission.  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  He reports that he has quit smoking. His smoking use included cigarettes. He has a 2.00 pack-year smoking history. He has never used smokeless tobacco.   He drinks bourbon once a month.  He does not use illicit drugs.   Allergies  Allergen Reactions  . Penicillins Hives, Swelling and Other (See Comments)    Ankle swelling Has patient had a PCN reaction causing immediate rash, facial/tongue/throat swelling, SOB or lightheadedness with hypotension: Yes Has patient had a PCN reaction causing severe rash involving mucus membranes or skin necrosis: No Has patient had a PCN reaction that required hospitalization: No Has patient had a PCN reaction occurring within the last 10 years: No If all of the above answers are "NO", then may proceed with Cephalosporin use.    Medications Prior to Admission  Medication Sig Dispense Refill  . acetaminophen (TYLENOL) 325 MG tablet Take 650 mg by mouth every 6 (six) hours as needed (pain).     Marland Kitchen atorvastatin (LIPITOR) 40 MG tablet Take 40 mg by mouth at bedtime.     . clopidogrel (PLAVIX) 75 MG tablet Take 1 tablet (75 mg total) by mouth daily. (Patient taking differently:  Take 75 mg by mouth at bedtime. ) 30 tablet 11  . ferric citrate (AURYXIA) 1 GM 210 MG(Fe) tablet Take 630 mg by mouth 3 (three) times daily with meals.     Marland Kitchen glucose monitoring kit (FREESTYLE) monitoring kit 1 each by Does not apply route 4 (four) times daily - after meals and at bedtime. 1 month Diabetic Testing Supplies for QAC-QHS accuchecks.Any brand OK 1 each 1  . ibuprofen (ADVIL,MOTRIN) 200 MG tablet Take 400 mg by mouth daily as needed for headache (pain).     . insulin NPH-regular Human (NOVOLIN 70/30) (70-30) 100 UNIT/ML injection Inject 5-6 Units into the skin 2 (two) times daily with a meal. Sliding Scale - based on CBG    . Insulin Syringe-Needle U-100 25G X 1" 1 ML MISC Any brand, for 4 times a day insulin SQ,  1 month supply. 30 each 0  . midodrine (PROAMATINE) 5 MG tablet Take 10 mg by mouth 2 (two) times daily with a meal.  11  . Multiple Vitamins-Minerals (MULTIVITAMIN WITH MINERALS) tablet Take 1 tablet by mouth daily with supper.     . mupirocin ointment (BACTROBAN) 2 % Apply 1 application topically daily as needed (rassh).   0  . primidone (MYSOLINE) 50 MG tablet Take 0.5 tablets (25 mg total) by mouth at bedtime. (Patient taking differently: Take 50 mg by mouth at bedtime. ) 30 tablet 0  . timolol (TIMOPTIC) 0.5 % ophthalmic solution Place 1 drop into the left eye 2 (two) times daily.      Drug Regimen Review  Drug regimen was reviewed and remains appropriate with no significant issues identified  Home: Home Living Family/patient expects to be discharged to:: Private residence Living Arrangements: Spouse/significant other Available Help at Discharge: Family, Available PRN/intermittently Type of Home: House Home Access: Stairs to enter Technical brewer of Steps: 2 Entrance Stairs-Rails: Right Home Layout: (basement, main level and then upstairs second level) Alternate Level Stairs-Number of Steps: 2 flights of 7 each Alternate Level Stairs-Rails: Left Bathroom Shower/Tub: Chiropodist: Standard Bathroom Accessibility: Yes Home Equipment: Environmental consultant - 2 wheels, Cane - single point Additional Comments: 1/2 bath main level, no bedroom on main level  Lives With: Spouse   Functional History: Prior Function Level of Independence: Independent with assistive device(s) Comments: Occasionally uses cane  Functional Status:  Mobility: Bed Mobility Overal bed mobility: Needs Assistance Bed Mobility: Sit to Supine Sit to supine: Min assist General bed mobility comments: Pt was received sitting up in the recliner Transfers Overall transfer level: Needs assistance Equipment used: Rolling walker (2 wheeled) Transfers: Sit to/from Stand Sit to Stand: Min assist Stand  pivot transfers: Min assist General transfer comment: VC's for hand placement on seated surface for safety. Pt was able to power-up to full stand with heavy min assist for balance support as he reached for the RW. Ambulation/Gait Ambulation/Gait assistance: Min assist, +2 safety/equipment Gait Distance (Feet): 50 Feet(25', seated rest, 25') Assistive device: Rolling walker (2 wheeled) Gait Pattern/deviations: Step-to pattern, Trunk flexed General Gait Details: VC's for sequencing and general safety with the RW. Close chair follow provided due to unsteadiness and min assist provided for balance support and safety.  Gait velocity: Decreased Gait velocity interpretation: <1.31 ft/sec, indicative of household ambulator    ADL: ADL Overall ADL's : Needs assistance/impaired Eating/Feeding: Independent Grooming: Wash/dry hands, Wash/dry face, Supervision/safety Upper Body Bathing: Set up, Sitting Lower Body Bathing: Moderate assistance, Sit to/from stand, Cueing for compensatory techniques, Cueing for safety, Cueing  for sequencing Upper Body Dressing : Set up, Sitting Lower Body Dressing: Moderate assistance, Sit to/from stand, Cueing for sequencing, Cueing for safety Toilet Transfer: Moderate assistance, Stand-pivot, RW Toileting- Clothing Manipulation and Hygiene: Moderate assistance, Sit to/from stand  Cognition: Cognition Overall Cognitive Status: Within Functional Limits for tasks assessed Orientation Level: Oriented X4 Cognition Arousal/Alertness: Awake/alert Behavior During Therapy: WFL for tasks assessed/performed Overall Cognitive Status: Within Functional Limits for tasks assessed General Comments: Pt pleasant and motivated    Blood pressure 106/67, pulse 73, temperature 98.4 F (36.9 C), temperature source Oral, resp. rate 18, height _0  (1.854 m), weight 101 kg, SpO2 100 %. Physical Exam  Constitutional: He is oriented to person, place, and time. He appears well-developed  and well-nourished.  HENT:  Head: Normocephalic and atraumatic.  Poor dentition  Eyes: EOM are normal. Right eye exhibits no discharge. Left eye exhibits no discharge.  Neck: Normal range of motion. Neck supple.  Cardiovascular: Normal rate and regular rhythm.  Respiratory: Effort normal and breath sounds normal.  GI: Soft. Bowel sounds are normal.  Musculoskeletal:  Left stump edema tenderness Right lower extremity amputation of digits  Neurological: He is alert and oriented to person, place, and time.  Motor: RUE: 5/5 proximal to distal RLE: 4+/5 proximal to distal LUE: 4/5 proximal to distal (baseline) LUE tremor LLE: HF 4-4+/5 (pain inhibition) Sensation diminished to light touch left hand  Skin:  Left stump with dressing C/D/I Right plantar foot ulcer  Psychiatric: He has a normal mood and affect. His behavior is normal.    Results for orders placed or performed during the hospital encounter of 11/17/18 (from the past 48 hour(s))  Glucose, capillary     Status: Abnormal   Collection Time: 11/20/18  6:09 PM  Result Value Ref Range   Glucose-Capillary 158 (H) 70 - 99 mg/dL  Glucose, capillary     Status: Abnormal   Collection Time: 11/20/18  9:57 PM  Result Value Ref Range   Glucose-Capillary 189 (H) 70 - 99 mg/dL  CBC     Status: Abnormal   Collection Time: 11/21/18  7:04 AM  Result Value Ref Range   WBC 11.8 (H) 4.0 - 10.5 K/uL   RBC 3.84 (L) 4.22 - 5.81 MIL/uL   Hemoglobin 10.6 (L) 13.0 - 17.0 g/dL   HCT 34.8 (L) 39.0 - 52.0 %   MCV 90.6 80.0 - 100.0 fL   MCH 27.6 26.0 - 34.0 pg   MCHC 30.5 30.0 - 36.0 g/dL   RDW 16.4 (H) 11.5 - 15.5 %   Platelets 331 150 - 400 K/uL   nRBC 0.0 0.0 - 0.2 %    Comment: Performed at [facility], [address] [place], [place] [address]  Basic metabolic panel     Status: Abnormal   Collection Time: 11/21/18  7:04 AM  Result Value Ref Range   Sodium 132 (L) 135 - 145 mmol/L   Potassium 5.1 3.5 - 5.1 mmol/L   Chloride 97  (L) 98 - 111 mmol/L   CO2 25 22 - 32 mmol/L   Glucose, Bld 255 (H) 70 - 99 mg/dL   BUN 36 (H) 8 - 23 mg/dL   Creatinine, Ser 8.13 (H) 0.61 - 1.24 mg/dL   Calcium 8.2 (L) 8.9 - 10.3 mg/dL   GFR calc non Af Amer 6 (L) >60 mL/min   GFR calc Af Amer 7 (L) >60 mL/min   Anion gap 10 5 - 15    Comment: Performed  at [facility], [place] [address], [place], [place] [address]  Glucose, capillary     Status: Abnormal   Collection Time: 11/21/18  7:24 AM  Result Value Ref Range   Glucose-Capillary 239 (H) 70 - 99 mg/dL  Vancomycin, random     Status: None   Collection Time: 11/21/18  8:54 AM  Result Value Ref Range   Vancomycin Rm 38     Comment:        Random Vancomycin therapeutic range is dependent on dosage and time of specimen collection. A peak range is 20.0-40.0 ug/mL A trough range is 5.0-15.0 ug/mL        Performed at [facility] [address] [place], [place] [address]   Glucose, capillary     Status: Abnormal   Collection Time: 11/21/18 11:15 AM  Result Value Ref Range   Glucose-Capillary 188 (H) 70 - 99 mg/dL  Glucose, capillary     Status: Abnormal   Collection Time: 11/21/18  6:31 PM  Result Value Ref Range   Glucose-Capillary 162 (H) 70 - 99 mg/dL  Glucose, capillary     Status: Abnormal   Collection Time: 11/21/18  9:19 PM  Result Value Ref Range   Glucose-Capillary 195 (H) 70 - 99 mg/dL  Glucose, capillary     Status: Abnormal   Collection Time: 11/22/18  7:55 AM  Result Value Ref Range   Glucose-Capillary 160 (H) 70 - 99 mg/dL   Comment 1 Document in Chart   Glucose, capillary     Status: Abnormal   Collection Time: 11/22/18 11:40 AM  Result Value Ref Range   Glucose-Capillary 122 (H) 70 - 99 mg/dL   No results found.     Medical Problem List and Plan: 1.  Deficits with mobility, transfers, endurance, self-care secondary to left BKA. 2.  DVT Prophylaxis/Anticoagulation: Pharmaceutical: Heparin 3. Pain Management: DC IV Dilaudid.  Continue  oxycodone as needed with tramadol in between. 4. Mood: LCSW to follow for evaluation and support 5. Neuropsych: This patient is capable of making decisions on his own behalf. 6. Skin/Wound Care: Routine pressure relief measures.  Will order [facility] for right foot.  Maintain adequate nutrition and hydration status.   7. Fluids/Electrolytes/Nutrition: Strict I's and O's.  Carb modified/ renal diet with 1200 cc fluid restriction. 8.  T2DM: Monitor blood sugars AC at bedtime.  Continue Lantus and titrate as indicated for tighter control 9.  ESRD: Continue hemodialysis MWF at end of day to help with tolerance of activity.  Metrodin on hold. 10.  Sepsis due to MRSA bacteremia: Vancomycin with hemodialysis through 12/9 11.  Anemia of chronic disease: Monitor H&H with hemodialysis for trend.. 12  Diabetic peripheral neuropathy with right plantar neuropathic ulcer: Monitor for now and educated on pressure relief measures.        Post Admission Physician Evaluation: 1. Preadmission assessment reviewed and changes made below. 2. Functional deficits secondary  to left BKA with history of right lower extremity digit amputations. 3. Patient is admitted to receive collaborative, interdisciplinary care between the physiatrist, rehab nursing staff, and therapy team. 4. Patient's level of medical complexity and substantial therapy needs in context of that medical necessity cannot be provided at a lesser intensity of care such as a SNF. 5. Patient has experienced substantial functional loss from his/her baseline which was documented above under the "Functional History" and "Functional Status" headings.  Judging by the patient's diagnosis, physical exam, and functional history, the patient has potential for  functional progress which will result in measurable gains while on inpatient rehab.  These gains will be of substantial and practical use upon discharge  in facilitating mobility and self-care at the household  level. 6. Physiatrist will provide 24 hour management of medical needs as well as oversight of the therapy plan/treatment and provide guidance as appropriate regarding the interaction of the two. 7. 24 hour rehab nursing will assist with safety, skin/wound care, disease management, pain management and patient education  and help integrate therapy concepts, techniques,education, etc. 8. PT will assess and treat for/with: Lower extremity strength, range of motion, stamina, balance, functional mobility, safety, adaptive techniques and equipment, wound care, coping skills, pain control, education. Goals are: Supervision/mod I. 9. OT will assess and treat for/with: ADL's, functional mobility, safety, upper extremity strength, adaptive techniques and equipment, wound mgt, ego support, and community reintegration.   Goals are: Supervision/mod I. Therapy may not proceed with showering this patient. 10. Case Management and Social Worker will assess and treat for psychological issues and discharge planning. 11. Team conference will be held weekly to assess progress toward goals and to determine barriers to discharge. 12. Patient will receive at least 3 hours of therapy per day at least 5 days per week. 13. ELOS: 4-7 days.       14. Prognosis:  good  I have personally performed a face to face diagnostic evaluation, including, but not limited to relevant history and physical exam findings, of this patient and developed relevant assessment and plan.  Additionally, I have reviewed and concur with the physician assistant's documentation above.  The patient's status has not changed. The original post admission physician evaluation remains appropriate, and any changes from the pre-admission screening or documentation from the acute chart are noted above.    Delice Lesch, MD, ABPMR Bary Leriche, PA-C 11/22/2018

## 2018-11-22 NOTE — Progress Notes (Signed)
Jamse Arn, MD    Jamse Arn, MD  Physician  Physical Medicine and Rehabilitation      Consult Note  Signed     Date of Service:  11/21/2018  6:11 AM         Related encounter: ED to Hosp-Admission (Discharged) from 11/17/2018 in [facility]             Signed          Expand All Collapse All            Expand widget buttonCollapse widget button    Show:Clear all   ManualTemplateCopied  Added by:     Angiulli, Lavon Paganini, PA-C  Jamse Arn, MD   Hover for detailscustomization button                                                                                                                                                                                                untitled image              Physical Medicine and Rehabilitation Consult  Reason for Consult:  Decreased functional mobility  Referring Physician: [facility]        HPI: LORIS WINROW Sr. is a 67 y.o. right handed male with history of cardiomyopathy, diabetes mellitus, end-stage renal disease with hemodialysis, hypertension, tobacco abuse,peripheral vascular disease with right second and third toe amputation as well as left second toe amputation. Per chart review, patient, and wife, he lives with spouse. Used assistive device prior to admission. 3 level home with bedroom upstairs. Wife works during the day. Presented  11/17/2018 with bilateral chronic foot ulcers. Patient does attend the wound clinic. Recent wound cultures staph MRSA started on vancomycin. MRI of left foot showed extensive osteomyelitis. Limb was not felt to be salvageable. 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 Dorna Leitz. Hospital course pain management. Hemodialysis ongoing as per renal services. Therapy evaluations completed. M.D. Has requested physical medicine rehabilitation consult.        Review of Systems   HENT: Negative for hearing loss.    Eyes: Negative for blurred vision and double vision.   Respiratory: Negative for cough and shortness of breath.    Cardiovascular: Positive for leg swelling. Negative for chest pain and palpitations.   Gastrointestinal: Positive for constipation. Negative for nausea and vomiting.   Genitourinary: Negative for dysuria and hematuria.   Musculoskeletal: Positive for joint pain and  myalgias.   Skin: Negative for rash.   Neurological: Positive for sensory change (Baseline left hand numbness), focal weakness and headaches.   All other systems reviewed and are negative.          Past Medical History:    Diagnosis   Date    .   Cardiomyopathy ([place])        .   Cataract            right eye    .   Diabetes mellitus            type 2    .   Diabetic foot ulcer ([place])        .   Diabetic infection of right foot ([place])        .   Diabetic peripheral neuropathy ([place])        .   ESRD (end stage renal disease) ([place])            m-w-f [address]    .   Glaucoma            left eye    .   Headache        .   HTN (hypertension)        .   Osteomyelitis ([place])        .   Peripheral vascular disease ([place])            critical limb ischemia             Past Surgical History:    Procedure   Laterality   Date    .   ABDOMINAL AORTOGRAM   N/A   05/04/2018        Procedure: ABDOMINAL AORTOGRAM;  Surgeon: Conrad [place], MD;  Location: [place] [facility];  Service: Cardiovascular;  Laterality: N/A;    .   AMPUTATION TOE   Right   01/21/2017        Procedure: AMPUTATION 2ND TOE  RIGHT FOOT ;  Surgeon: Dorna Leitz, MD;  Location: [place];  Service: Orthopedics;  Laterality: Right;    .   AMPUTATION TOE   Left   05/18/2017        Procedure: 5TH RAY AMPUTATION  and EXCISION CUBOID;  Surgeon: Dorna Leitz, MD;  Location: [place];  Service: Orthopedics;  Laterality: Left;    .   AMPUTATION TOE   Bilateral   01/27/2018        Procedure: RIGHT FOOT 3RD TOE AMPUTATION WITH INCISIONAL WOUND DEBRIDEMENT. LEFT FOOT 2ND TOE AMPUTATION AND INCISIONAL WOUND DEBRIDEMENT.;  Surgeon: Dorna Leitz, MD;  Location: [facility];  Service: Orthopedics;  Laterality: Bilateral;    .   AV FISTULA PLACEMENT   Left   11/27/2014        Procedure: ARTERIOVENOUS (AV) FISTULA CREATION;  Surgeon: Conrad Montmorenci, MD;  Location: [place];  Service: Vascular;  Laterality: Left;    .   CHOLECYSTECTOMY            .   COLONOSCOPY   N/A   11/28/2014        Procedure: COLONOSCOPY;  Surgeon: Ladene Artist, MD;  Location: [facility]OS[facility];  Service: Endoscopy;  Laterality: N/A;    .   ESOPHAGOGASTRODUODENOSCOPY   N/A   11/28/2014        Procedure: ESOPHAGOGASTRODUODENOSCOPY (EGD);  Surgeon: Ladene Artist, MD;  Location: [facility] ENDOSCOPY;  Service: Endoscopy;  Laterality: N/A;    .  EYE SURGERY                retina and cataract left eye surgery    .   I&D EXTREMITY       01/28/2012        Procedure: IRRIGATION AND DEBRIDEMENT EXTREMITY;  Surgeon: Paulene Floor, MD;  [facility]: [facility];  Service: Orthopedics;  Laterality: Left;  irrigation and drainage left hand    .   I&D EXTREMITY       01/30/2012        Procedure: IRRIGATION AND DEBRIDEMENT EXTREMITY;  Surgeon: Paulene Floor, MD;  Location: [facility];  Service: Orthopedics;  Laterality: Left;  flexor teno synovectomy and extensor tenosynovectomy arthrosynovectomy wristjoint    .   I&D EXTREMITY       02/01/2012        Procedure: IRRIGATION AND DEBRIDEMENT EXTREMITY;   Surgeon: Paulene Floor, MD;  [facility]: [facility];  Service: Orthopedics;  Laterality: Left;  Left Wrist, Hand and Forearm    .   IRRIGATION AND DEBRIDEMENT FOOT   Left   01/21/2017        Procedure: DEBRIDEMENT LEFT PLANTAR WOUND;  Surgeon: Dorna Leitz, MD;  Location: [place];  Service: Orthopedics;  Laterality: Left;    .   Left 4th toe amputation            .   LEFT AND RIGHT HEART CATHETERIZATION WITH CORONARY ANGIOGRAM   N/A   11/26/2014        Procedure: LEFT AND RIGHT HEART CATHETERIZATION WITH CORONARY ANGIOGRAM;  Surgeon: Leonie Man, MD;  [facility];  Service: Cardiovascular;  Laterality: N/A;    .   LOWER EXTREMITY ANGIOGRAPHY   Bilateral   05/04/2018        Procedure: Lower Extremity Angiography;  Surgeon: Conrad [place], MD;  Location: [place] [facility];  Service: Cardiovascular;  Laterality: Bilateral;    .   LOWER EXTREMITY ANGIOGRAPHY   Left   05/11/2018        Procedure: LOWER EXTREMITY ANGIOGRAPHY;  Surgeon: Conrad [place], MD;  Location: [place] [facility];  Service: Cardiovascular;  Laterality: Left;    .   PERIPHERAL VASCULAR BALLOON ANGIOPLASTY   Left   05/11/2018        Procedure: PERIPHERAL VASCULAR BALLOON ANGIOPLASTY;  Surgeon: Conrad [place], MD;  Location: [facility];  Service: Cardiovascular;  Laterality: Left;  Posterior tibial    .   PERIPHERAL VASCULAR INTERVENTION   Left   05/11/2018        Procedure: PERIPHERAL VASCULAR INTERVENTION;  Surgeon: Conrad [place], MD;  Location: [place] [facility];  Service: Cardiovascular;  Laterality: Left;  tibioperoneal trunk    .   TONSILLECTOMY                     Family History    Problem   Relation   Age of Onset    .   Heart disease   Mother        .   Diabetes   Father        .   Diabetes   Brother        .   Diabetes   Sister        .   Diabetes   Brother        .    Diabetes   Brother  Social History:  reports that he has quit smoking. His smoking use included cigarettes. He has a 2.00 pack-year smoking history. He has never used smokeless tobacco. He reports that he does not drink alcohol or use drugs.  Allergies:         Allergies    Allergen   Reactions    .   Penicillins   Hives, Swelling and Other (See Comments)            Ankle swelling  Has patient had a PCN reaction causing immediate rash, facial/tongue/throat swelling, SOB or lightheadedness with hypotension: Yes  Has patient had a PCN reaction causing severe rash involving mucus membranes or skin necrosis: No  Has patient had a PCN reaction that required hospitalization: No  Has patient had a PCN reaction occurring within the last 10 years: No  If all of the above answers are "NO", then may proceed with Cephalosporin use.              Medications Prior to Admission    Medication   Sig   Dispense   Refill    .   acetaminophen (TYLENOL) 325 MG tablet   Take 650 mg by mouth every 6 (six) hours as needed (pain).             Marland Kitchen   atorvastatin (LIPITOR) 40 MG tablet   Take 40 mg by mouth at bedtime.             .   ciprofloxacin (CIPRO) 500 MG tablet   Take 1 tablet (500 mg total) by mouth daily.   42 tablet   1    .   clopidogrel (PLAVIX) 75 MG tablet   Take 1 tablet (75 mg total) by mouth daily. (Patient taking differently: Take 75 mg by mouth at bedtime. )   30 tablet   11    .   ferric citrate (AURYXIA) 1 GM 210 MG(Fe) tablet   Take 630 mg by mouth 3 (three) times daily with meals.             Marland Kitchen   ibuprofen (ADVIL,MOTRIN) 200 MG tablet   Take 400 mg by mouth daily as needed for headache (pain).             .   insulin NPH-regular Human (NOVOLIN 70/30) (70-30) 100 UNIT/ML injection   Inject 5-6 Units into the skin 2 (two) times daily with a meal. Sliding Scale - based on CBG             .   midodrine (PROAMATINE) 5 MG tablet   Take 10 mg by mouth 2 (two) times daily with a meal.       11    .   Multiple Vitamins-Minerals (MULTIVITAMIN WITH MINERALS) tablet   Take 1 tablet by mouth daily with supper.             .   mupirocin ointment (BACTROBAN) 2 %   Apply 1 application topically daily as needed (rassh).        0    .   primidone (MYSOLINE) 50 MG tablet   Take 0.5 tablets (25 mg total) by mouth at bedtime. (Patient taking differently: Take 50 mg by mouth at bedtime. )   30 tablet   0    .   timolol (TIMOPTIC) 0.5 % ophthalmic solution   Place 1 drop into the left eye 2 (two) times daily.            Marland Kitchen  glucose monitoring kit (FREESTYLE) monitoring kit   1 each by Does not apply route 4 (four) times daily - after meals and at bedtime. 1 month Diabetic Testing Supplies for QAC-QHS accuchecks.Any brand OK   1 each   1    .   HYDROcodone-acetaminophen (NORCO/VICODIN) 5-325 MG tablet   Take 1 tablet by mouth every 6 (six) hours as needed for moderate pain. (Patient not taking: Reported on 11/17/2018)   30 tablet   0    .   Insulin Syringe-Needle U-100 25G X 1" 1 ML MISC   Any brand, for 4 times a day insulin SQ, 1 month supply.   30 each   0    .   midodrine (PROAMATINE) 10 MG tablet   Take 1 tablet (10 mg total) by mouth 3 (three) times daily with meals. (Patient not taking: Reported on 11/17/2018)   90 tablet   6          Home:  Home Living  Living Arrangements: Spouse/significant other   Functional History:    Functional Status:   Mobility:             ADL:       Cognition:  Cognition  Orientation Level: Oriented X4       Blood pressure (!) 158/93, pulse 78, temperature 98.4 F (36.9 C), temperature source Oral, resp. rate 18, height _0  (1.854 m), weight 92.7 kg, SpO2 100 %.  Physical Exam   Vitals reviewed.  Constitutional: He is  oriented to person, place, and time. He appears well-developed and well-nourished.   HENT:   Head: Normocephalic and atraumatic.  Poor dentition   Eyes: EOM are normal. Right eye exhibits no discharge. Left eye exhibits no discharge.   Neck: Normal range of motion. Neck supple. No thyromegaly present.   Cardiovascular: Normal rate and regular rhythm.   Respiratory: Effort normal and breath sounds normal. No respiratory distress.   GI: Soft. Bowel sounds are normal. He exhibits no distension.  Musculoskeletal:  Left stump edema and tenderness  Neurological: He is alert and oriented to person, place, and time.  Motor: RUE: 5/5 proximal to distal RLE: 4+/5 proximal to distal LUE: 4/5 proximal to distal (baseline) LUE tremor LLE: HF 4+/5 Sensation diminished to light touch left hand   Skin:  Left BKA site is dressed appropriately tender.  Psychiatric: He has a normal mood and affect. His behavior is normal.         Lab Results Last 24 Hours  Imaging Results (Last 48 hours)                              Assessment/Plan:  Diagnosis: Left BKA  Labs independently reviewed.  Records reviewed and summated above.  Clean amputation daily with soap and water  Monitor incision site for signs of infection or impending skin breakdown.  Staples to remain in place for 3-4 weeks  Stump shrinker, for edema control   Scar mobilization massaging to prevent soft  tissue adherence Stump protector during therapies  Prevent flexion contractures by implementing the following:               Encourage prone lying for 20-30 mins per day BID to avoid hip flexion       Contractures if medically appropriate;              Avoid pillow under knees when patient is lying in bed in order to prevent both        knee and hip flexion contractures;              Avoid prolonged sitting  Post surgical pain control with oral medication  Phantom limb pain control with physical modalities including desensitization techniques (gentle self massage to the residual stump,hot packs if sensation intact, [place]) and mirror therapy, TENS. If ineffective, consider pharmacological treatment for neuropathic pain (e.g gabapentin, pregabalin, amytriptalyine, duloxetine).   When using wheelchair, patient should have knee on amputated side fully extended with board under the seat cushion.  Avoid injury to contralateral side     1.Does the need for close, 24 hr/day medical supervision in concert with the patient's rehab needs make it unreasonable for this patient to be served in a less intensive setting? Yes    2.Co-Morbidities requiring supervision/potential complications: post-op pain management (Biofeedback training with therapies to help reduce reliance on opiate pain medications, monitor pain control during therapies, and sedation at rest and titrate to maximum efficacy to ensure participation and gains in therapies), cardiomyopathy, DM (Monitor in accordance with exercise and adjust meds as necessary), end-stage renal disease (recs per Nephro), HTN (monitor and provide prns in accordance with increased physical exertion and pain), tobacco abuse (counsel), peripheral vascular disease, with right second and third toe amputation, hyponatremia (cont to monitor, treat if necessary), leukocytosis (repeat labs, cont to monitor for signs and symptoms of infection, further workup if indicated)     3.Due to safety, skin/wound care, disease management, pain management and patient education, does the patient require 24 hr/day rehab nursing? Yes   4.Does the patient require coordinated care of a physician, rehab nurse, PT (1-2 hrs/day, 5 days/week) and OT (1-2 hrs/day, 5 days/week) to address physical and functional deficits in the context of the above medical diagnosis(es)? Yes Addressing deficits in the following areas: balance, endurance, locomotion, strength, transferring, bathing, dressing, toileting and psychosocial support   5.Can the patient actively participate in an intensive therapy program of at least 3 hrs of therapy per day at least 5 days per week? Yes   6.The potential for patient to make measurable gains while on inpatient rehab is excellent   7.Anticipated functional outcomes upon discharge from inpatient rehab are TBD  with PT, supervision and min assist with OT, n/a with SLP.   8.Estimated rehab length of stay to reach the above functional goals is: 7-10 days.   9.Anticipated D/C setting: Home   10.Anticipated post D/C treatments: HH therapy and Home  excercise program   11.Overall Rehab/Functional Prognosis: good      RECOMMENDATIONS:  This patient's condition is appropriate for continued rehabilitative care in the following setting: CIR when medically appropriate  Patient has agreed to participate in recommended program. Yes  Note that insurance prior authorization may be required for reimbursement for recommended care.     Comment: Rehab Admissions Coordinator to follow up.        I have personally performed a face to face diagnostic evaluation, including, but not limited to relevant history and physical exam findings, of this patient and developed relevant assessment and plan.  Additionally, I have reviewed and concur with the physician assistant's documentation above.      Delice Lesch, MD, ABPMR  Lavon Paganini Angiulli,  PA-C  11/21/2018                Revision History                                        Routing History

## 2018-11-22 NOTE — Discharge Instructions (Addendum)
Bacteremia Bacteremia is the presence of bacteria in the blood. When bacteria enter the bloodstream, they can cause a life-threatening reaction called sepsis, which is a medical emergency. Bacteremia can spread to other parts of the body, including the heart, joints, and brain. What are the causes? This condition is caused by bacteria that get into the blood. Bacteria can enter the blood:  From a skin infection or a cut on your skin.  During an episode of pneumonia.  From an infection in your stomach or intestine (gastrointestinal infection).  From an infection in your bladder or urinary system (urinary tract infection).  During a dental or medical procedure.  After you brush your teeth so hard that your gums bleed.  When a bacterial infection in another part of the body spreads to the blood.  Through a dirty needle.  What increases the risk? This condition is more likely to develop in:  Children.  Elderly adults.  People who have a long-lasting (chronic) disease or medical condition.  People who have an artificial joint or heart valve.  People who have heart valve disease.  People who have a tube, such as a catheter or IV tube, that has been inserted for a medical treatment.  People who have a weak body defense system (immune system).  People who use IV drugs.  What are the signs or symptoms? Symptoms of this condition include:  Fever.  Chills.  A racing heart.  Shortness of breath.  Dizziness.  Weakness.  Confusion.  Nausea or vomiting.  Diarrhea.  In some cases, there are no symptoms. Bacteremia that has spread to the other parts of the body may cause symptoms in those areas. How is this diagnosed? This condition may be diagnosed with a physical exam and tests, such as:  A complete blood count (CBC). This test looks for signs of infection.  Blood cultures. These look for bacteria in your blood.  Tests of any tubes that you may have inserted  into your body, such as an IV tube or urinary catheter. These tests look for a source of infection.  Urine tests including urine cultures. These look for bacteria in the urine that could be a source of infection.  Imaging tests, such as an X-ray, CT scan, MRI, or heart ultrasound. These look for a source of infection in other parts of the body, such as the lungs, heart valves, or joints.  How is this treated? This condition may be treated with:  Antibiotic medicines given through an IV infusion. Depending on the source of infection, antibiotics may be needed for several weeks. At first, an antibiotic may be given to kill most types of blood bacteria. If your test results show that a certain kind of bacteria is causing problems, the antibiotic may be changed to kill only the bacteria that are causing problems.  Antibiotics taken by mouth.  IV fluids to support the body as you fight the infection.  Removing any catheter or device that could be a source of infection.  Blood pressure and breathing support, if you have sepsis.  Surgery to control the source or spread of infection, such as: ? Removing an infected implanted device. ? Removing infected tissue or an abscess.  This condition is usually treated at a hospital. If you are treated at home, you may need to come back for medicines, blood tests, and evaluation. This is important. Follow these instructions at home:  Take over-the-counter and prescription medicines only as told by your health care  provider.  If you were prescribed an antibiotic, take it as told by your health care provider. Do not stop taking the antibiotic even if you start to feel better.  Rest until your condition is under control.  Drink enough fluid to keep your urine clear or pale yellow.  Do not smoke. If you need help quitting, ask your health care provider.  Keep all follow-up visits as told by your health care provider. This is important. How is this  prevented?  Get the vaccinations that your health care provider recommends.  Clean and cover any scrapes or cuts.  Take good care of your skin. This includes regular bathing and moisturizing.  Wash your hands often.  Practice good oral hygiene. Brush your teeth two times a day and floss regularly. Get help right away if:  You have pain.  You have a fever.  You have trouble breathing.  Your skin becomes blotchy, pale, or clammy.  You develop confusion, dizziness, or weakness.  You develop diarrhea.  You develop any new symptoms after treatment. Summary  Bacteremia is the presence of bacteria in the blood. When bacteria enter the bloodstream, they can cause a life- threatening reaction called sepsis.  Children and elderly adults are at increased risk of bacteremia. Other risk factors include having a long-lasting (chronic) disease or a weak immune system, having an artificial joint or heart valve, having heart valve disease, having tubes that were inserted in the body for medical treatment, or using IV drugs.  Some symptoms of bacteremia include fever, chills, shortness of breath, confusion, nausea or vomiting, and diarrhea.  Tests may be done to diagnose a source of infection that led to bacteremia. These tests may include blood tests, urine tests, and imaging tests.  Bacteremia is usually treated with antibiotics, usually in a hospital. This information is not intended to replace advice given to you by your health care provider. Make sure you discuss any questions you have with your health care provider. Document Released: 09/26/2006 Document Revised: 11/09/2016 Document Reviewed: 11/09/2016 Elsevier Interactive Patient Education  2018 [employer].   Bacteremia Bacteremia is the presence of bacteria in the blood. When bacteria enter the bloodstream, they can cause a life-threatening reaction called sepsis, which is a medical emergency. Bacteremia can spread to other parts  of the body, including the heart, joints, and brain. What are the causes? This condition is caused by bacteria that get into the blood. Bacteria can enter the blood:  From a skin infection or a cut on your skin.  During an episode of pneumonia.  From an infection in your stomach or intestine (gastrointestinal infection).  From an infection in your bladder or urinary system (urinary tract infection).  During a dental or medical procedure.  After you brush your teeth so hard that your gums bleed.  When a bacterial infection in another part of the body spreads to the blood.  Through a dirty needle.  What increases the risk? This condition is more likely to develop in:  Children.  Elderly adults.  People who have a long-lasting (chronic) disease or medical condition.  People who have an artificial joint or heart valve.  People who have heart valve disease.  People who have a tube, such as a catheter or IV tube, that has been inserted for a medical treatment.  People who have a weak body defense system (immune system).  People who use IV drugs.  What are the signs or symptoms? Symptoms of this condition include:  Fever.  Chills.  A racing heart.  Shortness of breath.  Dizziness.  Weakness.  Confusion.  Nausea or vomiting.  Diarrhea.  In some cases, there are no symptoms. Bacteremia that has spread to the other parts of the body may cause symptoms in those areas. How is this diagnosed? This condition may be diagnosed with a physical exam and tests, such as:  A complete blood count (CBC). This test looks for signs of infection.  Blood cultures. These look for bacteria in your blood.  Tests of any tubes that you may have inserted into your body, such as an IV tube or urinary catheter. These tests look for a source of infection.  Urine tests including urine cultures. These look for bacteria in the urine that could be a source of infection.  Imaging  tests, such as an X-ray, CT scan, MRI, or heart ultrasound. These look for a source of infection in other parts of the body, such as the lungs, heart valves, or joints.  How is this treated? This condition may be treated with:  Antibiotic medicines given through an IV infusion. Depending on the source of infection, antibiotics may be needed for several weeks. At first, an antibiotic may be given to kill most types of blood bacteria. If your test results show that a certain kind of bacteria is causing problems, the antibiotic may be changed to kill only the bacteria that are causing problems.  Antibiotics taken by mouth.  IV fluids to support the body as you fight the infection.  Removing any catheter or device that could be a source of infection.  Blood pressure and breathing support, if you have sepsis.  Surgery to control the source or spread of infection, such as: ? Removing an infected implanted device. ? Removing infected tissue or an abscess.  This condition is usually treated at a hospital. If you are treated at home, you may need to come back for medicines, blood tests, and evaluation. This is important. Follow these instructions at home:  Take over-the-counter and prescription medicines only as told by your health care provider.  If you were prescribed an antibiotic, take it as told by your health care provider. Do not stop taking the antibiotic even if you start to feel better.  Rest until your condition is under control.  Drink enough fluid to keep your urine clear or pale yellow.  Do not smoke. If you need help quitting, ask your health care provider.  Keep all follow-up visits as told by your health care provider. This is important. How is this prevented?  Get the vaccinations that your health care provider recommends.  Clean and cover any scrapes or cuts.  Take good care of your skin. This includes regular bathing and moisturizing.  Wash your hands  often.  Practice good oral hygiene. Brush your teeth two times a day and floss regularly. Get help right away if:  You have pain.  You have a fever.  You have trouble breathing.  Your skin becomes blotchy, pale, or clammy.  You develop confusion, dizziness, or weakness.  You develop diarrhea.  You develop any new symptoms after treatment. Summary  Bacteremia is the presence of bacteria in the blood. When bacteria enter the bloodstream, they can cause a life- threatening reaction called sepsis.  Children and elderly adults are at increased risk of bacteremia. Other risk factors include having a long-lasting (chronic) disease or a weak immune system, having an artificial joint or heart valve, having heart valve  disease, having tubes that were inserted in the body for medical treatment, or using IV drugs.  Some symptoms of bacteremia include fever, chills, shortness of breath, confusion, nausea or vomiting, and diarrhea.  Tests may be done to diagnose a source of infection that led to bacteremia. These tests may include blood tests, urine tests, and imaging tests.  Bacteremia is usually treated with antibiotics, usually in a hospital. This information is not intended to replace advice given to you by your health care provider. Make sure you discuss any questions you have with your health care provider. Document Released: 09/26/2006 Document Revised: 11/09/2016 Document Reviewed: 11/09/2016 Elsevier Interactive Patient Education  2018 [employer]. Bone and Joint Infections, Adult Bone infections (osteomyelitis) and joint infections (septic arthritis) occur when bacteria or other germs get inside a bone or a joint. This can happen if you have an infection in another part of your body that spreads through your blood. Germs from your skin or from outside of your body can also cause this type of infection if you have a wound or a broken bone (fracture) that breaks the skin. Anyone can get  a bone infection or joint infection. You may be more likely to get this type of infection if you have a condition, such as diabetes, that lowers your ability to fight infection or increases your chances of getting an infection. Bone and joint infections can cause damage, and they can spread to other areas of your body. They need to be treated quickly. What are the causes? Most bone and joint infections are caused by bacteria. They can also be caused by other germs, such as viruses and funguses. What increases the risk? This condition is more likely to develop in:  People who recently had surgery, especially bone or joint surgery.  People who have a long-term (chronic) disease, such as: ? HIV (human immunodeficiency virus). ? Diabetes. ? Rheumatoid arthritis. ? Sickle cell anemia.  Elderly people.  People who take medicines that block or weaken the bodys defense system (immune system).  People who have a condition that reduces their blood flow.  People who are on kidney dialysis.  People who have an artificial joint.  People who have had a joint or bone repaired with plates or screws (surgical hardware).  People who use or abuse IV drugs.  People who have had trauma, such as stepping on a nail.  What are the signs or symptoms? Symptoms vary depending on the type and location of your infection. Common symptoms of bone and joint infections include:  Fever and chills.  Redness and warmth.  Swelling.  Pain and stiffness.  Drainage of fluid or pus near the infection.  Weight loss and fatigue.  Decreased ability to use a hand or foot.  How is this diagnosed? This condition may be diagnosed based on symptoms, medical history, a physical exam, and diagnostic tests. Tests can help to identify the cause of the infection. You may have various tests, such as:  A sample of tissue, fluid, or blood taken to be examined under a microscope.  A procedure to remove fluid from the  infected joint with a needle (joint aspiration) for testing in a lab.  Pus or discharge swabbed from a wound for testing to identify germs and to determine what type of medicine will kill them (culture and sensitivity).  Blood tests to look for evidence of infection and inflammation (biomarkers).  Imaging studies to determine how severe the bone or joint infection is. These  may include: ? X-rays. ? CT scan. ? MRI. ? Bone scan.  How is this treated? Treatment depends on the cause and type of infection. Antibiotic medicines are usually the first treatment for a bone or joint infection. Treatment with antibiotics may include:  Getting IV antibiotics. This may be done in a hospital at first. You may have to continue IV antibiotics at home for several weeks. You may also have to take antibiotics by mouth for several weeks after that.  Taking more than one kind of antibiotic. Treatment may start with a type of antibiotic that works against many different bacteria (broad spectrumantibiotics). IV antibiotics may be changed if tests show that another type may work better.  Other treatments may include:  Draining fluid from the joint by placing a needle into it (aspiration).  Surgery to remove: ? Dead or dying tissue from a bone or joint. ? An infected artificial joint. ? Infected plates or screws that were used to repair a broken bone.  Follow these instructions at home:  Take medicines only as directed by your health care provider.  Take your antibiotic medicine as directed by your health care provider. Finish the antibiotic even if you start to feel better.  Follow instructions from your health care provider about how to take IV antibiotics at home.  Ask your health care provider if you have any restrictions on your activities.  Keep all follow-up visits as directed by your health care provider. This is important. Contact a health care provider if:  You have a fever or  chills.  You have redness, warmth, pain, or swelling that returns after treatment. Get help right away if:  You have rapid breathing or you have trouble breathing.  You have chest pain.  You cannot drink fluids or make urine.  The affected arm or leg swells, changes color, or turns blue. This information is not intended to replace advice given to you by your health care provider. Make sure you discuss any questions you have with your health care provider. Document Released: 12/13/2005 Document Revised: 05/20/2016 Document Reviewed: 12/11/2014 Elsevier Interactive Patient Education  [employer].

## 2018-11-22 NOTE — Progress Notes (Signed)
Pt arrives via bed to 4W21 with wife and grandson, oriented to room and call bell system and rehab process, pt and family verb understanding, continue poc.

## 2018-11-23 DIAGNOSIS — M62838 Other muscle spasm: Secondary | ICD-10-CM

## 2018-11-23 DIAGNOSIS — Z89512 Acquired absence of left leg below knee: Secondary | ICD-10-CM

## 2018-11-23 DIAGNOSIS — Z3A30 30 weeks gestation of pregnancy: Secondary | ICD-10-CM | POA: Insufficient documentation

## 2018-11-23 DIAGNOSIS — K5903 Drug induced constipation: Secondary | ICD-10-CM

## 2018-11-23 DIAGNOSIS — E119 Type 2 diabetes mellitus without complications: Secondary | ICD-10-CM | POA: Insufficient documentation

## 2018-11-23 LAB — GLUCOSE, CAPILLARY
Glucose-Capillary: 142 mg/dL — ABNORMAL HIGH (ref 70–99)
Glucose-Capillary: 88 mg/dL (ref 70–99)
Glucose-Capillary: 107 mg/dL — ABNORMAL HIGH (ref 70–99)
Glucose-Capillary: 128 mg/dL — ABNORMAL HIGH (ref 70–99)

## 2018-11-23 MED ORDER — CHLORHEXIDINE GLUCONATE CLOTH 2 % EX PADS
6.0000 | MEDICATED_PAD | Freq: Every day | CUTANEOUS | Status: DC
  Administered 2018-11-24: 6 via TOPICAL

## 2018-11-23 MED ORDER — POLYETHYLENE GLYCOL 3350 17 G PO PACK
17.0000 g | PACK | Freq: Every day | ORAL | Status: DC
  Administered 2018-11-28: 17 g via ORAL
  Filled 2018-11-23 (×9): qty 1

## 2018-11-23 MED ORDER — PNEUMOCOCCAL VAC POLYVALENT 25 MCG/0.5ML IJ INJ
0.5000 mL | INJECTION | INTRAMUSCULAR | Status: DC
  Filled 2018-11-23: qty 0.5

## 2018-11-23 MED ORDER — METHOCARBAMOL 500 MG PO TABS
500.0000 mg | ORAL_TABLET | Freq: Four times a day (QID) | ORAL | Status: DC
  Administered 2018-11-23 (×4): 500 mg via ORAL
  Filled 2018-11-23 (×4): qty 1

## 2018-11-23 NOTE — Progress Notes (Addendum)
[facility] PHYSICAL MEDICINE & REHABILITATION PROGRESS NOTE  Subjective/Complaints: Patient seen sitting up in bed this morning.  He states he did not sleep well overnight due to what appears to be muscle spasms.  He also notes constipation.  ROS: + Constipation, muscle spasms.  Denies CP, shortness of breath, nausea, vomiting, diarrhea.  Objective: Vital Signs: Blood pressure 133/81, pulse 77, temperature 98.7 F (37.1 C), resp. rate 18, weight 92.7 kg, SpO2 98 %. No results found. Recent Labs    11/20/18 1515 11/21/18 0704  WBC  --  11.8*  HGB 11.9* 10.6*  HCT 35.0* 34.8*  PLT  --  331   Recent Labs    11/20/18 1515 11/21/18 0704  NA 137 132*  K 4.6 5.1  CL  --  97*  CO2  --  25  GLUCOSE 109* 255*  BUN  --  36*  CREATININE  --  8.13*  CALCIUM  --  8.2*    Physical Exam: BP 133/81 (BP Location: Right Leg)   Pulse 77   Temp 98.7 F (37.1 C)   Resp 18   Wt 92.7 kg   SpO2 98%   BMI 26.96 kg/m  Constitutional: No distress . Vital signs reviewed. HENT: Normocephalic.  Atraumatic. Eyes: EOMI. No discharge. Cardiovascular: RRR. No JVD. Respiratory: CTA Bilaterally. Normal effort. GI: BS +. Non-distended. Musculoskeletal:  Left stump edema tenderness Right lower extremity amputation of digits  Neurological: He is alert and oriented Motor: RUE: 5/5 proximal to distal RLE: 4+/5 proximal to distal LUE: 4/5 proximal to distal (baseline) LUE tremor LLE: HF 4-4+/5 (pain inhibition) Sensation diminished to light touch left hand  (baseline) Skin:  Left stump with dressing C/D/I Right plantar foot ulcer  with dressing C/D/I Psychiatric: He has a normal mood and affect. His behavior is normal.   Assessment/Plan: 1. 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  Physiatrist is providing close team supervision and 24 hour  management of active medical problems listed below.  Physiatrist and rehab team continue to assess barriers to discharge/monitor patient progress toward functional and medical goals  Care Tool:  Bathing              Bathing assist Assist Level: Moderate Assistance - Patient 50 - 74%     Upper Body Dressing/Undressing Upper body dressing Upper body dressing/undressing activity did not occur (including orthotics): N/A What is the patient wearing?: Hospital gown only    Upper body assist Assist Level: Minimal Assistance - Patient > 75%    Lower Body Dressing/Undressing Lower body dressing      What is the patient wearing?: Hospital gown only     Lower body assist Assist for lower body dressing: Minimal Assistance - Patient > 75%     Toileting Toileting Toileting Activity did not occur Landscape architect and hygiene only): Safety/medical concerns  Toileting assist Assist for toileting: Minimal Assistance - Patient > 75%     Transfers Chair/bed transfer  Transfers assist  Chair/bed transfer activity did not occur: Safety/medical concerns  Chair/bed transfer assist level: Minimal Assistance - Patient > 75%     Locomotion Ambulation   Ambulation assist              Walk 10 feet activity   Assist           Walk 50 feet activity   Assist  Walk 150 feet activity   Assist           Walk 10 feet on uneven surface  activity   Assist           Wheelchair     Assist               Wheelchair 50 feet with 2 turns activity    Assist            Wheelchair 150 feet activity     Assist            Medical Problem List and Plan: 1.  Deficits with mobility, transfers, endurance, self-care secondary to left BKA.  Begin CIR tomorrow 2.  DVT Prophylaxis/Anticoagulation: Pharmaceutical: Heparin 3. Pain Management: DCed IV Dilaudid.  Continue oxycodone as needed with tramadol in between.  Robaxin  scheduled on 11/28 4. Mood: LCSW to follow for evaluation and support 5. Neuropsych: This patient is capable of making decisions on his own behalf. 6. Skin/Wound Care: Routine pressure relief measures.  Ordered PRAFO for right foot.  Maintain adequate nutrition and hydration status. 7. Fluids/Electrolytes/Nutrition: Strict I's and O's.  Carb modified/ renal diet with 1200 cc fluid restriction. 8.  T2DM: Monitor blood sugars AC at bedtime.  Continue Lantus and titrate as indicated for tighter control  Monitor with increased mobility 9.  ESRD: Continue hemodialysis MWF at end of day to help with tolerance of activity.  Metrodin on hold. 10.  Sepsis due to MRSA bacteremia: Vancomycin with hemodialysis through 12/9 11.  Anemia of chronic disease: Monitor H&H with hemodialysis for trend.  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 13.  Drug-induced constipation  Bowel regimen increased on 11/28  LOS: 1 days A FACE TO FACE EVALUATION WAS PERFORMED  Rowynn Mcweeney Lorie Phenix 11/23/2018, 8:50 AM

## 2018-11-23 NOTE — Plan of Care (Signed)
  Problem: Consults Goal: RH LIMB LOSS PATIENT EDUCATION Description Description: See Patient Education module for eduction specifics. Outcome: Progressing Goal: Skin Care Protocol Initiated - if Braden Score 18 or less Description If consults are not indicated, leave blank or document N/A Outcome: Progressing Goal: Nutrition Consult-if indicated Outcome: Progressing   Problem: RH BOWEL ELIMINATION Goal: RH STG MANAGE BOWEL WITH ASSISTANCE Description STG Manage Bowel with min  Assistance.  Outcome: Progressing   Problem: RH SKIN INTEGRITY Goal: RH STG SKIN FREE OF INFECTION/BREAKDOWN Description Patient will have infection managed with antibiotics at discharge.  Outcome: Progressing Goal: RH STG MAINTAIN SKIN INTEGRITY WITH ASSISTANCE Description STG Maintain Skin Integrity With min Assistance.  Outcome: Progressing Goal: RH STG ABLE TO PERFORM INCISION/WOUND CARE W/ASSISTANCE Description STG Able To Perform Incision/Wound Care With mod Assistance.  Outcome: Progressing   Problem: RH SAFETY Goal: RH STG ADHERE TO SAFETY PRECAUTIONS W/ASSISTANCE/DEVICE Description STG Adhere to Safety Precautions With  Min Assistance/Device.  Outcome: Progressing   Problem: RH PAIN MANAGEMENT Goal: RH STG PAIN MANAGED AT OR BELOW PT'S PAIN GOAL Description Less than 4 on 0-10 scale  Outcome: Progressing   Problem: RH KNOWLEDGE DEFICIT LIMB LOSS Goal: RH STG INCREASE KNOWLEDGE OF SELF CARE AFTER LIMB LOSS Description Patient will be able to verbalize how to manage care for self at discharge.  Outcome: Progressing

## 2018-11-23 NOTE — Plan of Care (Signed)
  Problem: Consults Goal: RH LIMB LOSS PATIENT EDUCATION Description Description: See Patient Education module for eduction specifics. 11/23/2018 1331 by Etheleen Nicks, RN Outcome: Progressing 11/23/2018 1330 by Etheleen Nicks, RN Outcome: Progressing 11/23/2018 1330 by Etheleen Nicks, RN Outcome: Progressing Goal: Skin Care Protocol Initiated - if Braden Score 18 or less Description If consults are not indicated, leave blank or document N/A 11/23/2018 1331 by Etheleen Nicks, RN Outcome: Progressing 11/23/2018 1330 by Etheleen Nicks, RN Outcome: Progressing 11/23/2018 1330 by Etheleen Nicks, RN Outcome: Progressing Goal: Nutrition Consult-if indicated 11/23/2018 1331 by Etheleen Nicks, RN Outcome: Progressing 11/23/2018 1330 by Etheleen Nicks, RN Outcome: Progressing 11/23/2018 1330 by Etheleen Nicks, RN Outcome: Progressing   Problem: RH BOWEL ELIMINATION Goal: RH STG MANAGE BOWEL WITH ASSISTANCE Description STG Manage Bowel with min  Assistance.  11/23/2018 1331 by Etheleen Nicks, RN Outcome: Not Progressing 11/23/2018 1330 by Etheleen Nicks, RN Outcome: Progressing 11/23/2018 1330 by Etheleen Nicks, RN Outcome: Progressing   Problem: RH SKIN INTEGRITY Goal: RH STG SKIN FREE OF INFECTION/BREAKDOWN Description Patient will have infection managed with antibiotics at discharge.  11/23/2018 1331 by Etheleen Nicks, RN Outcome: Progressing 11/23/2018 1330 by Etheleen Nicks, RN Outcome: Progressing 11/23/2018 1330 by Etheleen Nicks, RN Outcome: Progressing Goal: RH STG MAINTAIN SKIN INTEGRITY WITH ASSISTANCE Description STG Maintain Skin Integrity With min Assistance.  11/23/2018 1331 by Etheleen Nicks, RN Outcome: Progressing 11/23/2018 1330 by Etheleen Nicks, RN Outcome: Progressing 11/23/2018 1330 by Etheleen Nicks, RN Outcome: Progressing Goal: RH STG ABLE TO PERFORM INCISION/WOUND CARE W/ASSISTANCE Description STG Able To Perform Incision/Wound Care With mod Assistance.  11/23/2018 1331 by  Etheleen Nicks, RN Outcome: Not Progressing 11/23/2018 1330 by Etheleen Nicks, RN Outcome: Progressing 11/23/2018 1330 by Etheleen Nicks, RN Outcome: Progressing   Problem: RH SAFETY Goal: RH STG ADHERE TO SAFETY PRECAUTIONS W/ASSISTANCE/DEVICE Description STG Adhere to Safety Precautions With  Min Assistance/Device.  11/23/2018 1331 by Etheleen Nicks, RN Outcome: Progressing 11/23/2018 1330 by Etheleen Nicks, RN Outcome: Progressing 11/23/2018 1330 by Etheleen Nicks, RN Outcome: Progressing   Problem: RH PAIN MANAGEMENT Goal: RH STG PAIN MANAGED AT OR BELOW PT'S PAIN GOAL Description Less than 4 on 0-10 scale  11/23/2018 1331 by Etheleen Nicks, RN Outcome: Not Progressing 11/23/2018 1330 by Etheleen Nicks, RN Outcome: Progressing 11/23/2018 1330 by Etheleen Nicks, RN Outcome: Progressing   Problem: RH KNOWLEDGE DEFICIT LIMB LOSS Goal: RH STG INCREASE KNOWLEDGE OF SELF CARE AFTER LIMB LOSS Description Patient will be able to verbalize how to manage care for self at discharge.  11/23/2018 1331 by Etheleen Nicks, RN Outcome: Progressing 11/23/2018 1330 by Etheleen Nicks, RN Outcome: Progressing 11/23/2018 1330 by Etheleen Nicks, RN Outcome: Progressing    Pt dependent for incision care, pt no bm for 4 days, pt having difficulty with pain control due to muscle spasms/muscle jerking.

## 2018-11-23 NOTE — Evaluation (Signed)
Occupational Therapy Assessment and Plan  Patient Details  Name: KYRO JOSWICK Sr. MRN: [id number] Date of Birth: 13-Jun-1951  OT Diagnosis: muscle weakness (generalized) and L BKA Rehab Potential: Rehab Potential (ACUTE ONLY): Excellent ELOS: 7-10 days   Today's Date: 11/24/2018  Session 1 OT Individual Time: 4650-3546 OT Individual Time Calculation (min): 70 min     Session 2 OT Individual Time: 1130-1200 OT Individual Time Calculation (min): 30 min     Problem List:  Patient Active Problem List   Diagnosis Date Noted  . Elevated blood pressure reading   . S/P unilateral BKA (below knee amputation), left ([place])   . Muscle spasms of both lower extremities   . [redacted] weeks gestation of pregnancy   . Diabetes mellitus type 2 in nonobese (HCC)   . Drug induced constipation   . Unilateral complete BKA, left, initial encounter ([place]) 11/22/2018  . Postoperative pain   . Type 2 diabetes mellitus with peripheral neuropathy (HCC)   . Bacteremia   . Anemia of chronic disease   . MRSA bacteremia 11/21/2018  . Leukocytosis   . Tobacco abuse   . Post-operative pain   . Normal coronary arteries 10/31/2018  . Syncope, non cardiac 10/31/2018  . Sepsis ([place]) 05/10/2018  . HLD (hyperlipidemia) 05/10/2018  . Diabetic ulcer of foot associated with diabetes mellitus due to underlying condition, with necrosis of muscle ([place]) 01/27/2018  . Onychomycosis of multiple toenails with type 1 diabetes mellitus ([place]) 01/27/2018  . Onychomycosis of multiple toenails with type 2 diabetes mellitus and peripheral angiopathy ([place]) 10/05/2017  . ESRD on hemodialysis ([place])   . Osteomyelitis of right foot ([place]) 02/03/2017  . Type 2 diabetes mellitus with left diabetic foot ulcer ([place]) 01/21/2017  . Osteomyelitis of left foot ([place]) 09/11/2016  . Peripheral arterial disease ([place]) 08/12/2015  . Paroxysmal supraventricular tachycardia ([place]) 01/30/2015  . Occult blood in stools 11/25/2014  . Acute systolic heart  failure ([place]) 11/25/2014  . Congestive dilated cardiomyopathy ([place]) 11/24/2014  . Visual changes 11/23/2014  . Cardiomegaly 11/23/2014  . Benign essential HTN 11/23/2014  . 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 Anemia due to chronic blood loss 11/23/2014  . Anemia due to end stage renal disease ([place]) 11/22/2014  . Symptomatic anemia   . Cellulitis and abscess of hand 01/28/2012  . HTN (hypertension) 01/28/2012  . Hyponatremia 01/28/2012  . Normocytic anemia 01/28/2012  . Sinusitis 01/28/2012    Past Medical History:  Past Medical History:  Diagnosis Date  . Cardiomyopathy ([place])   . Cataract    right eye  . Diabetes mellitus    type 2  . Diabetic foot ulcer ([place])   . Diabetic infection of right foot ([place])   . Diabetic peripheral neuropathy ([place])   . ESRD (end stage renal disease) ([place])    m-w-f [employer] [address]  . Glaucoma    left eye  . Headache   . HTN (hypertension)   . Osteomyelitis ([place])   . Peripheral vascular disease ([place])    critical limb ischemia   Past Surgical History:  Past Surgical History:  Procedure Laterality Date  . ABDOMINAL AORTOGRAM N/A 05/04/2018   Procedure: ABDOMINAL AORTOGRAM;  Surgeon: Conrad [place], MD;  Location: [facility];  Service: Cardiovascular;  Laterality: N/A;  . AMPUTATION Left 11/20/2018   Procedure: AMPUTATION BELOW KNEE;  Surgeon: Dorna Leitz, MD;  Location: [facility];  Service: Orthopedics;  Laterality: Left;  . AMPUTATION TOE  Right 01/21/2017   Procedure: AMPUTATION 2ND TOE RIGHT FOOT ;  Surgeon: Dorna Leitz, MD;  Location: [place];  Service: Orthopedics;  Laterality: Right;  . AMPUTATION TOE Left 05/18/2017   Procedure: 5TH RAY AMPUTATION  and EXCISION CUBOID;  Surgeon: Dorna Leitz, MD;  Location: [facility];  Service: Orthopedics;  Laterality: Left;  . AMPUTATION TOE Bilateral 01/27/2018   Procedure: RIGHT FOOT 3RD TOE AMPUTATION WITH INCISIONAL WOUND DEBRIDEMENT. LEFT  FOOT 2ND TOE AMPUTATION AND INCISIONAL WOUND DEBRIDEMENT.;  Surgeon: Dorna Leitz, MD;  [facility]: [facility];  Service: Orthopedics;  Laterality: Bilateral;  . AV FISTULA PLACEMENT Left 11/27/2014   Procedure: ARTERIOVENOUS (AV) FISTULA CREATION;  Surgeon: Conrad Antoine, MD;  Location: [facility];  Service: Vascular;  Laterality: Left;  . CHOLECYSTECTOMY    . COLONOSCOPY N/A 11/28/2014   Procedure: COLONOSCOPY;  Surgeon: Ladene Artist, MD;  Location: [facility];  Service: Endoscopy;  Laterality: N/A;  . ESOPHAGOGASTRODUODENOSCOPY N/A 11/28/2014   Procedure: ESOPHAGOGASTRODUODENOSCOPY (EGD);  Surgeon: Ladene Artist, MD;  Location: [facility] ENDOSCOPY;  Service: Endoscopy;  Laterality: N/A;  . EYE SURGERY     retina and cataract left eye surgery  . I&D EXTREMITY  01/28/2012   Procedure: IRRIGATION AND DEBRIDEMENT EXTREMITY;  Surgeon: Paulene Floor, MD;  [facility];  Service: Orthopedics;  Laterality: Left;  irrigation and drainage left hand  . I&D EXTREMITY  01/30/2012   Procedure: IRRIGATION AND DEBRIDEMENT EXTREMITY;  Surgeon: Paulene Floor, MD;  [facility];  Service: Orthopedics;  Laterality: Left;  flexor teno synovectomy and extensor tenosynovectomy arthrosynovectomy wristjoint  . I&D EXTREMITY  02/01/2012   Procedure: IRRIGATION AND DEBRIDEMENT EXTREMITY;  Surgeon: Paulene Floor, MD;  [facility];  Service: Orthopedics;  Laterality: Left;  Left Wrist, Hand and Forearm  . IRRIGATION AND DEBRIDEMENT FOOT Left 01/21/2017   Procedure: DEBRIDEMENT LEFT PLANTAR WOUND;  Surgeon: Dorna Leitz, MD;  Location: [place];  Service: Orthopedics;  Laterality: Left;  . Left 4th toe amputation    . LEFT AND RIGHT HEART CATHETERIZATION WITH CORONARY ANGIOGRAM N/A 11/26/2014   Procedure: LEFT AND RIGHT HEART CATHETERIZATION WITH CORONARY ANGIOGRAM;  Surgeon: Leonie Man, MD;  Location: [facility];  Service: Cardiovascular;  Laterality: N/A;  . LOWER EXTREMITY ANGIOGRAPHY Bilateral 05/04/2018    Procedure: Lower Extremity Angiography;  Surgeon: Conrad [place], MD;  Location: [place] [facility];  Service: Cardiovascular;  Laterality: Bilateral;  . LOWER EXTREMITY ANGIOGRAPHY Left 05/11/2018   Procedure: LOWER EXTREMITY ANGIOGRAPHY;  Surgeon: Conrad [place], MD;  Location: [place] [facility];  Service: Cardiovascular;  Laterality: Left;  . PERIPHERAL VASCULAR BALLOON ANGIOPLASTY Left 05/11/2018   Procedure: PERIPHERAL VASCULAR BALLOON ANGIOPLASTY;  Surgeon: Conrad Flora, MD;  Location: [place] [facility];  Service: Cardiovascular;  Laterality: Left;  Posterior tibial  . PERIPHERAL VASCULAR INTERVENTION Left 05/11/2018   Procedure: PERIPHERAL VASCULAR INTERVENTION;  Surgeon: Conrad , MD;  Location: [place] [facility];  Service: Cardiovascular;  Laterality: Left;  tibioperoneal trunk  . TEE WITHOUT CARDIOVERSION N/A 11/20/2018   Procedure: TRANSESOPHAGEAL ECHOCARDIOGRAM (TEE);  Surgeon: Jerline Pain, MD;  [facility];  Service: Cardiovascular;  Laterality: N/A;  . TONSILLECTOMY      Assessment & Plan Clinical Impression: Patient is a 67 y.o. year old male with recent admission to the hospital on 11/17/2018 with malaise, fevers and left foot pain due to sepsis from staph bacteremia. Patient has refused amputation multiple times in  the past and MRI of foot done revealing active osteomyelitis with edema. He was agreeable to undergo left BKA on 11/20/2018. Patient transferred to CIR on 11/22/2018.    Patient currently requires min with basic self-care skills secondary to muscle weakness and decreased sitting balance, decreased standing balance and decreased balance strategies.  Prior to hospitalization, patient could complete BADL with independent .  Patient will benefit from skilled intervention to increase independence with basic self-care skills prior to discharge home with care partner.  Anticipate patient will require intermittent supervision and follow up home health.  OT - End of  Session Endurance Deficit: Yes Endurance Deficit Description: Rest breaks within BADL tasks OT Assessment Rehab Potential (ACUTE ONLY): Excellent OT Patient demonstrates impairments in the following area(s): Balance;Endurance;Pain;Safety OT Basic ADL's Functional Problem(s): Grooming;Dressing;Bathing;Toileting OT Transfers Functional Problem(s): Tub/Shower;Toilet OT Additional Impairment(s): None OT Plan OT Intensity: Minimum of 1-2 x/day, 45 to 90 minutes OT Frequency: 5 out of 7 days OT Duration/Estimated Length of Stay: 7-10 days OT Treatment/Interventions: Balance/vestibular training;Community reintegration;Discharge planning;Functional mobility training;Pain management;Patient/family education;Psychosocial support;Self Care/advanced ADL retraining;Skin care/wound managment;Therapeutic Activities;Therapeutic Exercise;UE/LE Strength taining/ROM;UE/LE Coordination activities;Wheelchair propulsion/positioning OT Basic Self-Care Anticipated Outcome(s): Mod I OT Toileting Anticipated Outcome(s): Mod I OT Bathroom Transfers Anticipated Outcome(s): Supervision OT Recommendation Patient destination: Home Follow Up Recommendations: Home health OT Equipment Recommended: To be determined Equipment Details: Likely 3-in-1 BSC, tub transfer bench   Skilled Therapeutic Intervention Session 1 Initial eval completed with treatment provided to address functional transfers, improved sit<>stand, transfers, and adapted bathing/dressing skills. Pt on bed pan upon OT arrival. Pt rolled for bed pan removal after successful BM. OT assistance for peri-care s/p BM. Pt came to sitting EOB with supervision. Squat-pivot to wc with min A. Bathing/dressig completed with education provided for leaning method to wash buttocks and pull up pants. Completed 2 sit<>stands with min A at the sink, then provided min A for balance when reaching outside base of support. Educated pt on limb positioning and care, OT goals, and POC.  Pt left seated in wc at end of session with safety belt on and needs met.   Session 2 Pt greeted sitting in wc finishing lunch and agreeable to OT. OT reviewed wc parts and had pt maneuver wc to EOB. Squat-pivot wc>bed w/ min A. Min A to return to supine. Pt completed L BKA therapeutic exercises in supine and prone, per handout given by PT. Educated pt on goal to completed 10 reps of each exercises 3x/day.  Pt left semi-reclined in bed with needs much and bed alarm on.  OT Evaluation Precautions/Restrictions  Precautions Precautions: Fall Precaution Comments: L BKA Required Braces or Orthoses: (No limb protector in room) Restrictions Weight Bearing Restrictions: No LLE Weight Bearing: Non weight bearing Pain Pain Assessment Pain Scale: 0-10 Pain Score: 0-No pain Faces Pain Scale: No hurt Home Living/Prior Functioning Home Living Family/patient expects to be discharged to:: Private residence Living Arrangements: Spouse/significant other Available Help at Discharge: Family, Available PRN/intermittently(Wife works, daughter will be there when first get home) Type of Home: House Home Access: Stairs to enter Technical brewer of Steps: 2 Entrance Stairs-Rails: Right Home Layout: (basement, main level and then upstairs second level) Alternate Level Stairs-Number of Steps: 2 flights of 7 each Alternate Level Stairs-Rails: Left Bathroom Shower/Tub: Chiropodist: Standard Bathroom Accessibility: Yes Additional Comments: 1/2 bath main level, no bedroom on main level  Lives With: Spouse IADL History Leisure and Hobbies: Enjoys watching movies and going to the movies with his wife.  IADL Comments: Works as a Geneticist, molecular nights and Sundays Prior Function Level of Independence: Independent with basic ADLs, Independent with homemaking with ambulation Driving: Yes Comments: Occasionally uses cane Vision Baseline Vision/History: Retinopathy Patient Visual Report: No  change from baseline Perception  Perception: Within Functional Limits Praxis Praxis: Intact Cognition Overall Cognitive Status: Within Functional Limits for tasks assessed Arousal/Alertness: Awake/alert Orientation Level: Person;Place;Situation Person: Oriented Place: Oriented Situation: Oriented Year: 2019 Month: November Day of Week: Correct Memory: Appears intact Immediate Memory Recall: Sock;Blue;Bed Memory Recall: Sock;Blue;Bed Memory Recall Sock: Without Cue Memory Recall Blue: Without Cue Memory Recall Bed: Without Cue Safety/Judgment: Appears intact Sensation Sensation Light Touch: Impaired Detail Central sensation comments: Nueropothy in R LE Coordination Gross Motor Movements are Fluid and Coordinated: No Fine Motor Movements are Fluid and Coordinated: Yes Mobility  Bed Mobility Bed Mobility: Supine to Sit Supine to Sit: Supervision/Verbal cueing  Balance Balance Balance Assessed: Yes Static Standing Balance Static Standing - Balance Support: Bilateral upper extremity supported Static Standing - Level of Assistance: 4: Min assist Dynamic Standing Balance Dynamic Standing - Balance Support: During functional activity Dynamic Standing - Level of Assistance: 4: Min assist Extremity/Trunk Assessment RUE Assessment RUE Assessment: Within Functional Limits General Strength Comments: general weakness LUE Assessment LUE Assessment: Within Functional Limits General Strength Comments: general weakness   Refer to Care Plan for Long Term Goals  Recommendations for other services: None   Discharge Criteria: Patient will be discharged from OT if patient refuses treatment 3 consecutive times without medical reason, if treatment goals not met, if there is a change in medical status, if patient makes no progress towards goals or if patient is discharged from hospital.  The above assessment, treatment plan, treatment alternatives and goals were discussed and mutually  agreed upon: by patient  Valma Cava 11/24/2018, 12:26 PM

## 2018-11-23 NOTE — Progress Notes (Signed)
Inpatient Rehabilitation  Patient information reviewed and entered into eRehab system by Ita Fritzsche M. Moses Odoherty, M.A., CCC/SLP, PPS Coordinator.  Information including medical coding, functional ability and quality indicators will be reviewed and updated through discharge.    Per nursing patient was given "Data Collection Information Summary" for Patients in Inpatient Rehabilitation Facilities with attached "Privacy Act Statement-Health Care Records" upon admission.   

## 2018-11-23 NOTE — Progress Notes (Signed)
[facility] Progress Note   Dialysis Orders: [facility] MWF  4h 8min 500/800  95.5kg   2/2.25 bath P2   AVF  Hep none -Hectorol 3 mcg IV TIW -Parsabiv 7.5 mg IV TIW  Assessment/Plan: 1. MRSA bacteremia - due to chronic bilateral foot wounds (had prior multiple MRSA wound cultures in Sept/Oct)  Hx osteomyelitis of L foot. . Pre-admit OP HD blood cx's 11/19 grew +MRSA. Repeat [facility] 11/22 neg final On vanc- last dose 11/24/cefepime d/c . S/P L BKA 11/20/18. Intraop TEE neg for endocarditis - ID rec Vanc through 12/9 = 14 days Vanc level 38 11/26 2. ESRD -MWF. HD Friday.  3. BP/volume - On midodrine as outpatient. Monitor.lower EDW due to amputation and lowering volume - post wt 91.3 11/26  4. Anemia - hgb10.6.Follow trends. Not currently on ESA 5. Metabolic bone disease -Continue Hectorol/Auryxia binder. Phos 3.1 decrease binder dose.No Parsabiv here. 6. Nutrition -Albumin 2.2 in setting of osteomyelitis.Renal diet/vitamins add prostat. 7. NICM EF 55-60% in 02/2018  8. PAD - s/p L tibial angioplasty/stenting 04/2018. Followed Dr. Scot Dock. On Plavix 9. DM T2 10. Wounds R plantar neuropathic ulcer - no osteo on MRI -WOC for R heel.  11. Disp - anticipate transfer to CIR today   Kelly Splinter MD [place] pager [phone]   11/23/2018, 1:07 PM    Subjective:   No c./o today  Objective Vitals:   11/22/18 1918 11/23/18 0108 11/23/18 0426 11/23/18 0440  BP: 109/64   133/81  Pulse: 77   77  Resp: 19   18  Temp: 98.5 F (36.9 C)   98.7 F (37.1 C)  TempSrc: Oral     SpO2: 99%   98%  Weight:  92.7 kg 92.7 kg    Physical Exam General: up in bed eating lunch Heart: RRR Lungs: no rales Abdomen: soft NT + BS Extremities: right LE no edema, left BKA in ACE wrap Dialysis Access:  Left upper AVF + bruit   Additional Objective Labs: Basic Metabolic Panel: Recent Labs  Lab 11/18/18 0622 11/19/18 0556 11/20/18 1515 11/21/18 0704   NA 134* 136 137 132*  K 3.9 4.0 4.6 5.1  CL 93* 98  --  97*  CO2 30 29  --  25  GLUCOSE 164* 189* 109* 255*  BUN 30* 23  --  36*  CREATININE 9.95* 7.42*  --  8.13*  CALCIUM 7.7* 8.0*  --  8.2*  PHOS  --  3.1  --   --    Liver Function Tests: Recent Labs  Lab 11/17/18 1346 11/19/18 0556  AST 49*  --   ALT 37  --   ALKPHOS 54  --   BILITOT 0.9  --   PROT 8.4*  --   ALBUMIN 2.8* 2.2*   No results for input(s): LIPASE, AMYLASE in the last 168 hours. CBC: Recent Labs  Lab 11/17/18 1346 11/18/18 0622 11/19/18 0556 11/20/18 1515 11/21/18 0704  WBC 9.4 6.2 7.7  --  11.8*  NEUTROABS 6.4  --   --   --   --   HGB 11.3* 9.6* 10.2* 11.9* 10.6*  HCT 36.3* 30.7* 32.5* 35.0* 34.8*  MCV 90.8 90.0 90.3  --  90.6  PLT 278 260 245  --  331   Blood Culture    Component Value Date/Time   SDES BLOOD RIGHT HAND 11/17/2018 1348   SPECREQUEST  11/17/2018 1348    BOTTLES DRAWN AEROBIC AND ANAEROBIC Blood Culture adequate volume  CULT  11/17/2018 1348    NO GROWTH 5 DAYS Performed at [facility], [place] [address], [place], [place] [address]    REPTSTATUS 11/22/2018 FINAL 11/17/2018 1348    Cardiac Enzymes: No results for input(s): CKTOTAL, CKMB, CKMBINDEX, TROPONINI in the last 168 hours. CBG: Recent Labs  Lab 11/22/18 1140 11/22/18 1628 11/22/18 2129 11/23/18 0629 11/23/18 1130  GLUCAP 122* 125* 116* 142* 88   Iron Studies: No results for input(s): IRON, TIBC, TRANSFERRIN, FERRITIN in the last 72 hours. Lab Results  Component Value Date   INR 1.15 05/10/2018   INR 1.19 05/17/2017   INR 1.33 11/27/2014   Studies/Results: No results found. Medications: . sodium chloride    . sodium chloride     . atorvastatin  40 mg Oral QHS  . [START ON 11/24/2018] doxercalciferol  3 mcg Intravenous Q M,W,F-HD  . feeding supplement (PRO-STAT SUGAR FREE 64)  30 mL Oral BID  . ferric citrate  210 mg Oral TID WC  . heparin  5,000 Units Subcutaneous Q8H  . hydrocerin   Topical  BID  . insulin aspart  0-9 Units Subcutaneous TID WC  . insulin aspart protamine- aspart  5 Units Subcutaneous BID WC  . methocarbamol  500 mg Oral QID  . multivitamin with minerals  1 tablet Oral Q supper  . mupirocin ointment  1 application Nasal BID  . [START ON 11/24/2018] polyethylene glycol  17 g Oral Daily  . primidone  50 mg Oral QHS  . senna-docusate  2 tablet Oral BID  . timolol  1 drop Left Eye BID  . vancomycin variable dose per unstable renal function (pharmacist dosing)   Does not apply See admin instructions

## 2018-11-24 ENCOUNTER — Inpatient Hospital Stay (HOSPITAL_COMMUNITY): Payer: Medicare Other | Admitting: Occupational Therapy

## 2018-11-24 ENCOUNTER — Inpatient Hospital Stay (HOSPITAL_COMMUNITY): Payer: Medicare Other | Admitting: Physical Therapy

## 2018-11-24 DIAGNOSIS — R030 Elevated blood-pressure reading, without diagnosis of hypertension: Secondary | ICD-10-CM

## 2018-11-24 DIAGNOSIS — D72829 Elevated white blood cell count, unspecified: Secondary | ICD-10-CM

## 2018-11-24 LAB — RENAL FUNCTION PANEL
Albumin: 2.5 g/dL — ABNORMAL LOW (ref 3.5–5.0)
Anion gap: 14 (ref 5–15)
BUN: 67 mg/dL — AB (ref 8–23)
CO2: 23 mmol/L (ref 22–32)
Calcium: 7.8 mg/dL — ABNORMAL LOW (ref 8.9–10.3)
Chloride: 91 mmol/L — ABNORMAL LOW (ref 98–111)
Creatinine, Ser: 10.51 mg/dL — ABNORMAL HIGH (ref 0.61–1.24)
GFR calc Af Amer: 5 mL/min — ABNORMAL LOW (ref >60)
GFR, EST NON AFRICAN AMERICAN: 5 mL/min — AB (ref >60)
Glucose, Bld: 115 mg/dL — ABNORMAL HIGH (ref 70–99)
Phosphorus: 4.4 mg/dL (ref 2.5–4.6)
Potassium: 4.5 mmol/L (ref 3.5–5.1)
Sodium: 128 mmol/L — ABNORMAL LOW (ref 135–145)

## 2018-11-24 LAB — GLUCOSE, CAPILLARY
Glucose-Capillary: 115 mg/dL — ABNORMAL HIGH (ref 70–99)
Glucose-Capillary: 101 mg/dL — ABNORMAL HIGH (ref 70–99)
Glucose-Capillary: 108 mg/dL — ABNORMAL HIGH (ref 70–99)
Glucose-Capillary: 135 mg/dL — ABNORMAL HIGH (ref 70–99)

## 2018-11-24 LAB — CBC
HCT: 27.8 % — ABNORMAL LOW (ref 39.0–52.0)
Hemoglobin: 8.2 g/dL — ABNORMAL LOW (ref 13.0–17.0)
MCH: 27.7 pg (ref 26.0–34.0)
MCHC: 29.5 g/dL — ABNORMAL LOW (ref 30.0–36.0)
MCV: 93.9 fL (ref 80.0–100.0)
NRBC: 0 % (ref 0.0–0.2)
Platelets: 410 10*3/uL — ABNORMAL HIGH (ref 150–400)
RBC: 2.96 MIL/uL — ABNORMAL LOW (ref 4.22–5.81)
RDW: 17.1 % — AB (ref 11.5–15.5)
WBC: 12.3 10*3/uL — ABNORMAL HIGH (ref 4.0–10.5)

## 2018-11-24 LAB — VANCOMYCIN, RANDOM: Vancomycin Rm: 26

## 2018-11-24 MED ORDER — METHOCARBAMOL 500 MG PO TABS
1000.0000 mg | ORAL_TABLET | Freq: Four times a day (QID) | ORAL | Status: DC
  Administered 2018-11-24 – 2018-12-02 (×32): 1000 mg via ORAL
  Filled 2018-11-24 (×33): qty 2

## 2018-11-24 MED ORDER — OXYCODONE-ACETAMINOPHEN 5-325 MG PO TABS
ORAL_TABLET | ORAL | Status: AC
  Filled 2018-11-24: qty 1

## 2018-11-24 MED ORDER — DOXERCALCIFEROL 4 MCG/2ML IV SOLN
INTRAVENOUS | Status: AC
  Administered 2018-11-24: 3 ug via INTRAVENOUS
  Filled 2018-11-24: qty 2

## 2018-11-24 MED ORDER — OXYCODONE-ACETAMINOPHEN 5-325 MG PO TABS
ORAL_TABLET | ORAL | Status: AC
  Administered 2018-11-24: 1 via ORAL
  Filled 2018-11-24: qty 1

## 2018-11-24 MED ORDER — VANCOMYCIN HCL IN DEXTROSE 1-5 GM/200ML-% IV SOLN
1000.0000 mg | INTRAVENOUS | Status: DC
  Administered 2018-11-24 – 2018-12-01 (×4): 1000 mg via INTRAVENOUS
  Filled 2018-11-24 (×4): qty 200

## 2018-11-24 NOTE — Plan of Care (Signed)
  Problem: RH Balance Goal: LTG: Patient will maintain dynamic sitting balance (OT) Description LTG:  Patient will maintain dynamic sitting balance with assistance during activities of daily living (OT) Flowsheets (Taken 11/24/2018 0950) LTG: Pt will maintain dynamic sitting balance during ADLs with: Independent Goal: LTG Patient will maintain dynamic standing with ADLs (OT) Description LTG:  Patient will maintain dynamic standing balance with assist during activities of daily living (OT)  Flowsheets (Taken 11/24/2018 0950) LTG: Pt will maintain dynamic standing balance during ADLs with: Supervision/Verbal cueing   Problem: Sit to Stand Goal: LTG:  Patient will perform sit to stand in prep for activites of daily living with assistance level (OT) Description LTG:  Patient will perform sit to stand in prep for activites of daily living with assistance level (OT) Flowsheets (Taken 11/24/2018 0950) LTG: PT will perform sit to stand in prep for activites of daily living with assistance level: Supervision/Verbal cueing   Problem: RH Grooming Goal: LTG Patient will perform grooming w/assist,cues/equip (OT) Description LTG: Patient will perform grooming with assist, with/without cues using equipment (OT) Flowsheets (Taken 11/24/2018 0950) LTG: Pt will perform grooming with assistance level of: Independent   Problem: RH Bathing Goal: LTG Patient will bathe all body parts with assist levels (OT) Description LTG: Patient will bathe all body parts with assist levels (OT) Flowsheets (Taken 11/24/2018 0950) LTG: Pt will perform bathing with assistance level/cueing: Independent with assistive device    Problem: RH Dressing Goal: LTG Patient will perform upper body dressing (OT) Description LTG Patient will perform upper body dressing with assist, with/without cues (OT). Flowsheets (Taken 11/24/2018 0950) LTG: Pt will perform upper body dressing with assistance level of: Independent Goal: LTG  Patient will perform lower body dressing w/assist (OT) Description LTG: Patient will perform lower body dressing with assist, with/without cues in positioning using equipment (OT) Flowsheets (Taken 11/24/2018 0950) LTG: Pt will perform lower body dressing with assistance level of: Independent   Problem: RH Toileting Goal: LTG Patient will perform toileting task (3/3 steps) with assistance level (OT) Description LTG: Patient will perform toileting task (3/3 steps) with assistance level (OT)  Flowsheets (Taken 11/24/2018 0950) LTG: Pt will perform toileting task (3/3 steps) with assistance level : Independent   Problem: RH Toilet Transfers Goal: LTG Patient will perform toilet transfers w/assist (OT) Description LTG: Patient will perform toilet transfers with assist, with/without cues using equipment (OT) Flowsheets (Taken 11/24/2018 0950) LTG: Pt will perform toilet transfers with assistance level of: Supervision/Verbal cueing   Problem: RH Tub/Shower Transfers Goal: LTG Patient will perform tub/shower transfers w/assist (OT) Description LTG: Patient will perform tub/shower transfers with assist, with/without cues using equipment (OT) Flowsheets (Taken 11/24/2018 0950) LTG: Pt will perform tub/shower stall transfers with assistance level of: Supervision/Verbal cueing

## 2018-11-24 NOTE — Progress Notes (Signed)
PHYSICAL MEDICINE & REHABILITATION PROGRESS NOTE  Subjective/Complaints: Patient seen sitting up in bed this AM.  He states he slept slightly better overnight, but continues to have muscle spasms.  He notes he had a BM.   ROS: +Muscle spasms.  Denies CP, shortness of breath, nausea, vomiting, diarrhea.  Objective: Vital Signs: Blood pressure (!) 141/52, pulse 79, temperature 98 F (36.7 C), temperature source Oral, resp. rate 18, height 5\' 11"  (1.803 m), weight 93.1 kg, SpO2 100 %. No results found. No results for input(s): WBC, HGB, HCT, PLT in the last 72 hours. No results for input(s): NA, K, CL, CO2, GLUCOSE, BUN, CREATININE, CALCIUM in the last 72 hours.  Physical Exam: BP (!) 141/52 (BP Location: Right Arm)   Pulse 79   Temp 98 F (36.7 C) (Oral)   Resp 18   Ht 5\' 11"  (1.803 m)   Wt 93.1 kg   SpO2 100%   BMI 28.63 kg/m  Constitutional: No distress . Vital signs reviewed. HENT: Normocephalic.  Atraumatic. Eyes: EOMI. No discharge. Cardiovascular: RRR. No JVD. Respiratory: CTA bilaterally. Normal effort. GI: BS +. Non-distended. Musculoskeletal:  Left stump edema tenderness, stable Right lower extremity amputation of digits  Neurological: He is alert and oriented Motor: RUE: 5/5 proximal to distal RLE: 4+/5 proximal to distal LUE: 4/5 proximal to distal (baseline), stable LUE tremor LLE: HF 4/5 (pain inhibition), stable Sensation diminished to light touch left hand  (baseline) Skin:  Left stump with incision c/d/i, blistering on posterior flap Open abrasion over left patella Right plantar foot ulcer  with dressing C/D/I Psychiatric: He has a normal mood and affect. His behavior is normal.   Assessment/Plan: 1. 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  Physiatrist is providing close team supervision and 24  hour management of active medical problems listed below.  Physiatrist and rehab team continue to assess barriers to discharge/monitor patient progress toward functional and medical goals  Care Tool:  Bathing              Bathing assist Assist Level: Moderate Assistance - Patient 50 - 74%     Upper Body Dressing/Undressing Upper body dressing Upper body dressing/undressing activity did not occur (including orthotics): N/A What is the patient wearing?: Hospital gown only    Upper body assist Assist Level: Moderate Assistance - Patient 50 - 74%    Lower Body Dressing/Undressing Lower body dressing      What is the patient wearing?: Hospital gown only     Lower body assist Assist for lower body dressing: Minimal Assistance - Patient > 75%     Toileting Toileting Toileting Activity did not occur (Clothing management and hygiene only): Safety/medical concerns  Toileting assist Assist for toileting: Moderate Assistance - Patient 50 - 74%     Transfers Chair/bed transfer  Transfers assist  Chair/bed transfer activity did not occur: Safety/medical concerns  Chair/bed transfer assist level: Minimal Assistance - Patient > 75%     Locomotion Ambulation   Ambulation assist              Walk 10 feet activity   Assist           Walk 50 feet activity   Assist           Walk 150 feet activity   Assist  Walk 10 feet on uneven surface  activity   Assist           Wheelchair     Assist               Wheelchair 50 feet with 2 turns activity    Assist            Wheelchair 150 feet activity     Assist            Medical Problem List and Plan: 1.  Deficits with mobility, transfers, endurance, self-care secondary to left BKA.  Begin CIR  2.  DVT Prophylaxis/Anticoagulation: Pharmaceutical: Heparin 3. Pain Management: DCed IV Dilaudid.  Continue oxycodone as needed with tramadol in  between.  Robaxin scheduled on 11/28, increased on 11/29 4. Mood: LCSW to follow for evaluation and support 5. Neuropsych: This patient is capable of making decisions on his own behalf. 6. Skin/Wound Care: Routine pressure relief measures.  Ordered PRAFO for right foot.  Maintain adequate nutrition and hydration status. 7. Fluids/Electrolytes/Nutrition: Strict I's and O's.  Carb modified/ renal diet with 1200 cc fluid restriction. 8.  T2DM: Monitor blood sugars AC at bedtime.  Continue Lantus and titrate as indicated for tighter control  Monitor with increased mobility 9.  ESRD: Continue hemodialysis MWF at end of day to help with tolerance of activity.  Metrodin on hold. 10.  Sepsis due to MRSA bacteremia: Vancomycin with hemodialysis through 12/9 - level WNL on 11/29 11.  Anemia of chronic disease: Monitor H&H with hemodialysis for trend.  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 13.  Drug-induced constipation  Bowel regimen increased on 11/28  Improving 14. Elevated blood pressure  Monitor with increased mobility 15. Leukocytosis  WBCs 11.8 on 11/26  Afebrile  Cont to monitor  LOS: 2 days A FACE TO FACE EVALUATION WAS PERFORMED  Damiano Stamper Lorie Phenix 11/24/2018, 8:06 AM

## 2018-11-24 NOTE — Progress Notes (Signed)
[facility] Individual Statement of Services  Patient Name:  [name] NOREN Sr.  Date:  11/24/2018  [facility].  Our goal is to provide you with an individualized program based on your diagnosis and situation, designed to meet your specific needs.  With this comprehensive rehabilitation program, you will be expected to participate in at least 3 hours of rehabilitation therapies Monday-Friday, with modified therapy programming on the weekends.  Your rehabilitation program will include the following services:  Physical Therapy (PT), Occupational Therapy (OT), 24 hour per day rehabilitation nursing, Neuropsychology, Case Management (Social Worker), Rehabilitation Medicine, Nutrition Services and Pharmacy Services  Weekly team conferences will be held on Tuesdays to discuss your progress.  Your Social Worker will talk with you frequently to get your input and to update you on team discussions.  Team conferences with you and your family in attendance may also be held.  Expected length of stay:  7 to 10 days  Overall anticipated outcome:  Supervision  Depending on your progress and recovery, your program may change. Your Social Worker will coordinate services and will keep you informed of any changes. Your Social Worker's name and contact numbers are listed  below.  The following services may also be recommended but are not provided by the [facility] will be made to provide these services after discharge if needed.  Arrangements include referral to agencies that provide these services.  Your insurance has been verified to be:  Medicare and [place] Your primary doctor is:  Dr. [name]  Pertinent information will be shared with your doctor and your insurance company.  Social Worker:  Alfonse Alpers, LCSW   [phone] or [phone]  Information discussed with and copy given to patient by: Trey Sailors, 11/24/2018, 5:04 PM

## 2018-11-24 NOTE — Procedures (Signed)
   I was present at this dialysis session, have reviewed the session itself and made  appropriate changes Kelly Splinter MD [place] pager [phone]   11/24/2018, 2:51 PM

## 2018-11-24 NOTE — Evaluation (Signed)
Physical Therapy Assessment and Plan  Patient Details  Name: [name] INNES Sr. MRN: [id number] Date of Birth: 04-06-1951  PT Diagnosis: Difficulty walking, Impaired sensation and Muscle weakness Rehab Potential: Good ELOS: 7-10 days   Today's Date: 11/24/2018 PT Individual Time: 1914-7829 PT Individual Time Calculation (min): 70 min    Problem List:  Patient Active Problem List   Diagnosis Date Noted  . Elevated blood pressure reading   . S/P unilateral BKA (below knee amputation), left ([place])   . Muscle spasms of both lower extremities   . [redacted] weeks gestation of pregnancy   . Diabetes mellitus type 2 in nonobese (HCC)   . Drug induced constipation   . Unilateral complete BKA, left, initial encounter ([place]) 11/22/2018  . Postoperative pain   . Type 2 diabetes mellitus with peripheral neuropathy (HCC)   . Bacteremia   . Anemia of chronic disease   . MRSA bacteremia 11/21/2018  . Leukocytosis   . Tobacco abuse   . Post-operative pain   . Normal coronary arteries 10/31/2018  . Syncope, non cardiac 10/31/2018  . Sepsis ([place]) 05/10/2018  . HLD (hyperlipidemia) 05/10/2018  . Diabetic ulcer of foot associated with diabetes mellitus due to underlying condition, with necrosis of muscle ([place]) 01/27/2018  . Onychomycosis of multiple toenails with type 1 diabetes mellitus ([place]) 01/27/2018  . Onychomycosis of multiple toenails with type 2 diabetes mellitus and peripheral angiopathy ([place]) 10/05/2017  . ESRD on hemodialysis ([place])   . Osteomyelitis of right foot ([place]) 02/03/2017  . Type 2 diabetes mellitus with left diabetic foot ulcer ([place]) 01/21/2017  . Osteomyelitis of left foot ([place]) 09/11/2016  . Peripheral arterial disease ([place]) 08/12/2015  . Paroxysmal supraventricular tachycardia ([place]) 01/30/2015  . Occult blood in stools 11/25/2014  . Acute systolic heart failure ([place]) 11/25/2014  . Congestive dilated cardiomyopathy ([place]) 11/24/2014  . Visual changes 11/23/2014  .  Cardiomegaly 11/23/2014  . Benign essential HTN 11/23/2014  . 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 Anemia due to chronic blood loss 11/23/2014  . Anemia due to end stage renal disease ([place]) 11/22/2014  . Symptomatic anemia   . Cellulitis and abscess of hand 01/28/2012  . HTN (hypertension) 01/28/2012  . Hyponatremia 01/28/2012  . Normocytic anemia 01/28/2012  . Sinusitis 01/28/2012    Past Medical History:  Past Medical History:  Diagnosis Date  . Cardiomyopathy ([place])   . Cataract    right eye  . Diabetes mellitus    type 2  . Diabetic foot ulcer ([place])   . Diabetic infection of right foot ([place])   . Diabetic peripheral neuropathy ([place])   . ESRD (end stage renal disease) ([place])    m-w-f [facility] [place] [address]  . Glaucoma    left eye  . Headache   . HTN (hypertension)   . Osteomyelitis ([place])   . Peripheral vascular disease ([place])    critical limb ischemia   Past Surgical History:  Past Surgical History:  Procedure Laterality Date  . ABDOMINAL AORTOGRAM N/A 05/04/2018   Procedure: ABDOMINAL AORTOGRAM;  Surgeon: Conrad Trevorton, MD;  Location: [facility];  Service: Cardiovascular;  Laterality: N/A;  . AMPUTATION Left 11/20/2018   Procedure: AMPUTATION BELOW KNEE;  Surgeon: Dorna Leitz, MD;  Location: [facility];  Service: Orthopedics;  Laterality: Left;  . AMPUTATION TOE Right 01/21/2017   Procedure: AMPUTATION 2ND TOE RIGHT FOOT ;  Surgeon: Dorna Leitz, MD;  Location: [facility];  Service: Orthopedics;  Laterality: Right;  . AMPUTATION TOE Left 05/18/2017   Procedure: 5TH RAY AMPUTATION  and EXCISION CUBOID;  Surgeon: Dorna Leitz, MD;  Location: [facility];  Service: Orthopedics;  Laterality: Left;  . AMPUTATION TOE Bilateral 01/27/2018   Procedure: RIGHT FOOT 3RD TOE AMPUTATION WITH INCISIONAL WOUND DEBRIDEMENT. LEFT FOOT 2ND TOE AMPUTATION AND INCISIONAL WOUND DEBRIDEMENT.;  Surgeon: Dorna Leitz, MD;  Location: [facility];   Service: Orthopedics;  Laterality: Bilateral;  . AV FISTULA PLACEMENT Left 11/27/2014   Procedure: ARTERIOVENOUS (AV) FISTULA CREATION;  Surgeon: Conrad [place], MD;  Location: [facility];  Service: Vascular;  Laterality: Left;  . CHOLECYSTECTOMY    . COLONOSCOPY N/A 11/28/2014   Procedure: COLONOSCOPY;  Surgeon: Ladene Artist, MD;  Location: [facility]OS[facility];  Service: Endoscopy;  Laterality: N/A;  . ESOPHAGOGASTRODUODENOSCOPY N/A 11/28/2014   Procedure: ESOPHAGOGASTRODUODENOSCOPY (EGD);  Surgeon: Ladene Artist, MD;  Location: [facility] ENDOSCOPY;  Service: Endoscopy;  Laterality: N/A;  . EYE SURGERY     retina and cataract left eye surgery  . I&D EXTREMITY  01/28/2012   Procedure: IRRIGATION AND DEBRIDEMENT EXTREMITY;  Surgeon: Paulene Floor, MD;  [facility];  Service: Orthopedics;  Laterality: Left;  irrigation and drainage left hand  . I&D EXTREMITY  01/30/2012   Procedure: IRRIGATION AND DEBRIDEMENT EXTREMITY;  Surgeon: Paulene Floor, MD;  Location: [facility];  Service: Orthopedics;  Laterality: Left;  flexor teno synovectomy and extensor tenosynovectomy arthrosynovectomy wristjoint  . I&D EXTREMITY  02/01/2012   Procedure: IRRIGATION AND DEBRIDEMENT EXTREMITY;  Surgeon: Paulene Floor, MD;  [facility];  Service: Orthopedics;  Laterality: Left;  Left Wrist, Hand and Forearm  . IRRIGATION AND DEBRIDEMENT FOOT Left 01/21/2017   Procedure: DEBRIDEMENT LEFT PLANTAR WOUND;  Surgeon: Dorna Leitz, MD;  Location: [facility];  Service: Orthopedics;  Laterality: Left;  . Left 4th toe amputation    . LEFT AND RIGHT HEART CATHETERIZATION WITH CORONARY ANGIOGRAM N/A 11/26/2014   Procedure: LEFT AND RIGHT HEART CATHETERIZATION WITH CORONARY ANGIOGRAM;  Surgeon: Leonie Man, MD;  Location: [facility] CATH LAB;  Service: Cardiovascular;  Laterality: N/A;  . LOWER EXTREMITY ANGIOGRAPHY Bilateral 05/04/2018   Procedure: Lower Extremity Angiography;  Surgeon: Conrad [place]ill, MD;  Location: [facility];   Service: Cardiovascular;  Laterality: Bilateral;  . LOWER EXTREMITY ANGIOGRAPHY Left 05/11/2018   Procedure: LOWER EXTREMITY ANGIOGRAPHY;  Surgeon: Conrad [place], MD;  Location: [facility];  Service: Cardiovascular;  Laterality: Left;  . PERIPHERAL VASCULAR BALLOON ANGIOPLASTY Left 05/11/2018   Procedure: PERIPHERAL VASCULAR BALLOON ANGIOPLASTY;  Surgeon: Conrad [place], MD;  Location: [facility];  Service: Cardiovascular;  Laterality: Left;  Posterior tibial  . PERIPHERAL VASCULAR INTERVENTION Left 05/11/2018   Procedure: PERIPHERAL VASCULAR INTERVENTION;  Surgeon: Conrad [place], MD;  Location: [facility];  Service: Cardiovascular;  Laterality: Left;  tibioperoneal trunk  . TEE WITHOUT CARDIOVERSION N/A 11/20/2018   Procedure: TRANSESOPHAGEAL ECHOCARDIOGRAM (TEE);  Surgeon: Jerline Pain, MD;  Location: [facility] OR;  Service: Cardiovascular;  Laterality: N/A;  . TONSILLECTOMY      Assessment & Plan Clinical Impression: Patient is a 67 y.o. year old male with recent admission to the hospital on 11/17/2018 with malaise, fevers and left foot pain due to sepsis from staph bacteremia.  Patient has refused amputation multiple times in the past and MRI of foot done revealing active osteomyelitis with edema.  He was agreeable to undergo left BKA on 11/20/2018 by  Dr. [name].  Patient transferred to CIR on 11/22/2018 .   Patient currently requires min with mobility secondary to muscle weakness and decreased standing balance and decreased balance strategies.  Prior to hospitalization, patient was independent  with mobility and lived with Spouse in a House home.  Home access is 2Stairs to enter.  Patient will benefit from skilled PT intervention to maximize safe functional mobility, minimize fall risk and decrease caregiver burden for planned discharge home with intermittent assist.  Anticipate patient will benefit from follow up [facility] at discharge.  PT - End of Session Activity Tolerance:  Tolerates 30+ min activity with multiple rests Endurance Deficit: Yes Endurance Deficit Description: Rest breaks within BADL tasks PT Assessment Rehab Potential (ACUTE/IP ONLY): Good PT Barriers to Discharge: Inaccessible home environment;Decreased caregiver support PT Patient demonstrates impairments in the following area(s): Balance;Endurance;Motor;Pain;Sensory PT Transfers Functional Problem(s): Bed Mobility;Bed to Chair;Car;Furniture PT Locomotion Functional Problem(s): Ambulation;Wheelchair Mobility;Stairs PT Plan PT Intensity: Minimum of 1-2 x/day ,45 to 90 minutes PT Frequency: 5 out of 7 days PT Duration Estimated Length of Stay: 7-10 days PT Treatment/Interventions: Ambulation/gait training;Discharge planning;Functional mobility training;Therapeutic Activities;Balance/vestibular training;Neuromuscular re-education;Therapeutic Exercise;Wheelchair propulsion/positioning;DME/adaptive equipment instruction;Pain management;Splinting/orthotics;UE/LE Strength taining/ROM;Community reintegration;Patient/family education;Stair training;UE/LE Coordination activities PT Transfers Anticipated Outcome(s): mod I PT Locomotion Anticipated Outcome(s): mod I w/c, supervision gait PT Recommendation Recommendations for Other Services: Neuropsych consult Follow Up Recommendations: Home health PT Patient destination: Home Equipment Recommended: Wheelchair (measurements);Wheelchair cushion (measurements)  Skilled Therapeutic Intervention Pt participated in skilled PT eval and was educated on PT POC and goals.  Pt performs squat pivot transfers with min A, sit <> stand with mod A with RW.  Gait with RW with mod A x 20' with w/c follow.  Simulated car transfer with min A from w/c.  Pt educated on desensitization and residual limb positioning as well as HEP for LE strengthening.  W/c management with supervision x 50', limited by fatigue, increased time for turns due to motor planning.  Pt educated on w/c  parts management with teach back method. Pt left in room with nursing present, needs at hand.  PT Evaluation Precautions/Restrictions Precautions Precautions: Fall Precaution Comments: L BKA Required Braces or Orthoses: (No limb protector in room) Restrictions Weight Bearing Restrictions: No LLE Weight Bearing: Non weight bearing Pain Pain Assessment Pain Scale: 0-10 Pain Score: 0-No pain Faces Pain Scale: Hurts a little bit Pain Type: Acute pain Pain Location: Leg Pain Orientation: Left Pain Descriptors / Indicators: Aching Pain Onset: On-going Pain Intervention(s): Repositioned;Rest Home Living/Prior Functioning Home Living Available Help at Discharge: Family;Available PRN/intermittently Type of Home: House Home Access: Stairs to enter [employer] of Steps: 2 Entrance Stairs-Rails: None Home Layout: Able to live on main level with bedroom/bathroom Alternate Level Stairs-Number of Steps: 2 flights of 7 each Alternate Level Stairs-Rails: Left Bathroom Shower/Tub: Chiropodist: Standard Bathroom Accessibility: Yes Additional Comments: 1/2 bath main level, no bedroom on main level  Lives With: Spouse Prior Function Level of Independence: Independent with basic ADLs;Independent with homemaking with ambulation;Independent with transfers;Independent with gait  Able to Take Stairs?: Yes Driving: Yes Vocation: Retired Comments: Occasionally uses Ambulance person: Within Advertising copywriter Praxis Praxis: Intact  Cognition Overall Cognitive Status: Within Functional Limits for tasks assessed Arousal/Alertness: Awake/alert Orientation Level: Oriented X4 Memory: Appears intact Safety/Judgment: Appears intact Sensation Sensation Light Touch: Impaired Detail Central sensation comments: Nueropothy in R LE Proprioception: Appears Intact Coordination Gross Motor Movements are Fluid and Coordinated: Yes Fine Motor  Movements are Fluid and  Coordinated: Yes Motor  Motor Motor - Skilled Clinical Observations: generalized weakness  Mobility Bed Mobility Bed Mobility: Supine to Sit Supine to Sit: Supervision/Verbal cueing Transfers Transfers: Squat Pivot Transfers;Sit to Stand Sit to Stand: Moderate Assistance - Patient 50-74% Squat Pivot Transfers: Minimal Assistance - Patient > 75% Transfer (Assistive device): Rolling walker Locomotion  Gait Ambulation: Yes Gait Assistance: Moderate Assistance - Patient 50-74% Gait Distance (Feet): 20 Feet Assistive device: Rolling walker Stairs / Additional Locomotion Stairs: No Wheelchair Mobility Wheelchair Mobility: Yes Wheelchair Assistance: Chartered loss adjuster: Both upper extremities Wheelchair Parts Management: Needs assistance Distance: 50  Trunk/Postural Assessment  Cervical Assessment Cervical Assessment: Within Functional Limits Thoracic Assessment Thoracic Assessment: Within Functional Limits Lumbar Assessment Lumbar Assessment: Within Functional Limits Postural Control Postural Control: Deficits on evaluation Righting Reactions: delayed  Balance Balance Balance Assessed: Yes Static Standing Balance Static Standing - Balance Support: Bilateral upper extremity supported Static Standing - Level of Assistance: 4: Min assist Dynamic Standing Balance Dynamic Standing - Balance Support: During functional activity Dynamic Standing - Level of Assistance: 3: Mod assist Extremity Assessment  RUE Assessment RUE Assessment: Within Functional Limits General Strength Comments: general weakness LUE Assessment LUE Assessment: Within Functional Limits General Strength Comments: general weakness RLE Assessment General Strength Comments: grossly 4-/5 LLE Assessment General Strength Comments: grossly 3/5    Refer to Care Plan for Long Term Goals  Recommendations for other services: Neuropsych  Discharge Criteria:  Patient will be discharged from PT if patient refuses treatment 3 consecutive times without medical reason, if treatment goals not met, if there is a change in medical status, if patient makes no progress towards goals or if patient is discharged from hospital.  The above assessment, treatment plan, treatment alternatives and goals were discussed and mutually agreed upon: by patient  Bettye Sitton 11/24/2018, 11:28 AM

## 2018-11-24 NOTE — Progress Notes (Signed)
Pharmacy Antibiotic Note  [name]. is a 67 y.o. male admitted on 11/22/2018 with sepsis secondary to MRSA bacteremia (from outside HD cultures) and osteomyelitis. Patient has ESRD on HD MWF. Pharmacy has been consulted for vancomycin dosing.  Today, his vancomycin random level is 26 mcg/mL, which is within the therapeutic range. We will resume Vancomycin after his dialysis session today.   Plan: Vancomycin 1g IV after each HD session (MWF) Follow for HD tolerance and schedule   Height: 5\' 11"  (180.3 cm) Weight: 205 lb 4 oz (93.1 kg) IBW/kg (Calculated) : 75.3  Temp (24hrs), Avg:98.1 F (36.7 C), Min:98 F (36.7 C), Max:98.4 F (36.9 C)  Recent Labs  Lab 11/17/18 1346 11/17/18 1406  11/17/18 1555 11/18/18 0622 11/19/18 0556  11/21/18 0704 11/21/18 0854 11/24/18 0614  WBC 9.4  --   --   --  6.2 7.7  --  11.8*  --   --   CREATININE 8.28*  --   --   --  9.95* 7.42*  --  8.13*  --   --   LATICACIDVEN  --  2.01*  --  1.35  --   --   --   --   --   --   VANCORANDOM  --   --    < >  --   --   --    < >  --  38 26   < > = values in this interval not displayed.    Estimated Creatinine Clearance: 10.3 mL/min (A) (by C-G formula based on SCr of 8.13 mg/dL (H)).    Allergies  Allergen Reactions  . Penicillins Hives, Swelling and Other (See Comments)    Ankle swelling Has patient had a PCN reaction causing immediate rash, facial/tongue/throat swelling, SOB or lightheadedness with hypotension: Yes Has patient had a PCN reaction causing severe rash involving mucus membranes or skin necrosis: No Has patient had a PCN reaction that required hospitalization: No Has patient had a PCN reaction occurring within the last 10 years: No If all of the above answers are "NO", then may proceed with Cephalosporin use.    Antimicrobials this admission: Cefepime 11/22>>11/24 Vancomycin 11/19>>  Microbiology results: 11/19 Outside HD BCx: MRSA 11/22 BCx: ngtd   Legrand Como,  Pharm.D., BCPS, BCIDP Clinical Pharmacist Phone: [phone] Please check AMION for all [place] numbers 11/24/2018, 8:25 AM

## 2018-11-25 ENCOUNTER — Inpatient Hospital Stay (HOSPITAL_COMMUNITY): Payer: Medicare Other

## 2018-11-25 ENCOUNTER — Inpatient Hospital Stay (HOSPITAL_COMMUNITY): Payer: Medicare Other | Admitting: Occupational Therapy

## 2018-11-25 ENCOUNTER — Inpatient Hospital Stay (HOSPITAL_COMMUNITY): Payer: Medicare Other | Admitting: Physical Therapy

## 2018-11-25 DIAGNOSIS — E119 Type 2 diabetes mellitus without complications: Secondary | ICD-10-CM

## 2018-11-25 DIAGNOSIS — R030 Elevated blood-pressure reading, without diagnosis of hypertension: Secondary | ICD-10-CM

## 2018-11-25 DIAGNOSIS — Z89512 Acquired absence of left leg below knee: Secondary | ICD-10-CM

## 2018-11-25 LAB — GLUCOSE, CAPILLARY
Glucose-Capillary: 98 mg/dL (ref 70–99)
Glucose-Capillary: 151 mg/dL — ABNORMAL HIGH (ref 70–99)
Glucose-Capillary: 86 mg/dL (ref 70–99)

## 2018-11-25 MED ORDER — CHLORHEXIDINE GLUCONATE CLOTH 2 % EX PADS: 6.0000 | MEDICATED_PAD | Freq: Every day | CUTANEOUS | Status: DC

## 2018-11-25 MED ORDER — DARBEPOETIN ALFA 25 MCG/0.42ML IJ SOSY
25.0000 ug | PREFILLED_SYRINGE | INTRAMUSCULAR | Status: DC
  Administered 2018-11-27: 25 ug via INTRAVENOUS
  Filled 2018-11-25: qty 0.42

## 2018-11-25 MED ORDER — PNEUMOCOCCAL VAC POLYVALENT 25 MCG/0.5ML IJ INJ
0.5000 mL | INJECTION | INTRAMUSCULAR | Status: AC
  Administered 2018-11-26: 0.5 mL via INTRAMUSCULAR

## 2018-11-25 NOTE — Progress Notes (Addendum)
[facility] Progress Note   Dialysis Orders: [facility] MWF  4h 29min 500/800-14 gauge needles  95.5kg   2/2.25 bath P2   AVF  Hep none -Hectorol 3 mcg IV TIW -Parsabiv 7.5 mg IV TIW  Assessment/Plan: 1. MRSA bacteremia - due to chronic bilateral foot wounds (had prior multiple MRSA wound cultures in Sept/Oct)  Hx osteomyelitis of L foot. . Pre-admit OP HD blood cx's 11/19 grew +MRSA. Repeat [facility] 11/22 neg final On vanc- last dose 11/24/cefepime d/c .S/P L BKA 11/20/18. Intraop TEE neg for endocarditis - ID rec Vanc through 12/9 = 14 days Transferred to rehab 11/27 2. ESRD -MWF. HD - next HD Monday - orders written 3. BP/volume - On midodrine as outpatient. Monitor.lower EDW due to amputation and lowering volume - post wt 91.3 11/26 net UF 2 L Friday with post wt 90.9 - will have lower edw for d/c then 94.4 this am ?? True weight  4. Anemia - hgb10.6> 8.2 11/29  - check Fe studies with HD Monday and resume ESA at the lowest dose - has only had one dose Mircera 50 in 2019 at his outpt HD unit  5. Metabolic bone disease -Continue Hectorol/Auryxia binder. Phos 3.1 decrease binder dose.No Parsabiv here. 6. Nutrition -Albumin 2.5  in setting of osteomyelitis.Renal diet/vitamins add prostat. 7. NICM EF 55-60% in 02/2018  8. PAD - s/p L tibial angioplasty/stenting 04/2018. Followed Dr. Scot Dock. On Plavix 9. DM T2 10. Wounds R plantar neuropathic ulcer - no osteo on MRI -WOC for R heel.   [name], PA-C [place] 11/25/2018,11:19 AM  LOS: 3 days   Pt seen, examined and agree w A/P as above.  Kelly Splinter MD [employer] pager [phone]   11/25/2018, 12:48 PM    Subjective:   Many questions about MRSA status - explained f/u blood cultures were negative  Objective Vitals:   11/24/18 1752 11/24/18 2041 11/25/18 0500 11/25/18 0522  BP: (!) 115/57 (!) 113/59  123/65  Pulse: 90 79  78  Resp: 18 18  19    Temp: 99.3 F (37.4 C) 98.6 F (37 C)  99.4 F (37.4 C)  TempSrc:  Tympanic    SpO2: 100% 100%  97%  Weight:   94.4 kg 94.4 kg  Height:       Physical Exam General: NAD  Heart: RRR Lungs: no rales Abdomen: soft NT Extremities: right LE dressing on plantar ulcer no edema left BKA with dressing Dialysis Access: left AVF + bruit   Additional Objective Labs: Basic Metabolic Panel: Recent Labs  Lab 11/19/18 0556 11/20/18 1515 11/21/18 0704 11/24/18 1321  NA 136 137 132* 128*  K 4.0 4.6 5.1 4.5  CL 98  --  97* 91*  CO2 29  --  25 23  GLUCOSE 189* 109* 255* 115*  BUN 23  --  36* 67*  CREATININE 7.42*  --  8.13* 10.51*  CALCIUM 8.0*  --  8.2* 7.8*  PHOS 3.1  --   --  4.4   Liver Function Tests: Recent Labs  Lab 11/19/18 0556 11/24/18 1321  ALBUMIN 2.2* 2.5*   No results for input(s): LIPASE, AMYLASE in the last 168 hours. CBC: Recent Labs  Lab 11/19/18 0556 11/20/18 1515 11/21/18 0704 11/24/18 1321  WBC 7.7  --  11.8* 12.3*  HGB 10.2* 11.9* 10.6* 8.2*  HCT 32.5* 35.0* 34.8* 27.8*  MCV 90.3  --  90.6 93.9  PLT 245  --  331 410*  Blood Culture    Component Value Date/Time   SDES BLOOD RIGHT HAND 11/17/2018 1348   SPECREQUEST  11/17/2018 1348    BOTTLES DRAWN AEROBIC AND ANAEROBIC Blood Culture adequate volume   CULT  11/17/2018 1348    NO GROWTH 5 DAYS Performed at [facility], [place] [address] [place], [place] [address]    REPTSTATUS 11/22/2018 FINAL 11/17/2018 1348    Cardiac Enzymes: No results for input(s): CKTOTAL, CKMB, CKMBINDEX, TROPONINI in the last 168 hours. CBG: Recent Labs  Lab 11/24/18 0618 11/24/18 1129 11/24/18 1813 11/24/18 2044 11/25/18 0731  GLUCAP 115* 101* 108* 135* 98   Iron Studies: No results for input(s): IRON, TIBC, TRANSFERRIN, FERRITIN in the last 72 hours. Lab Results  Component Value Date   INR 1.15 05/10/2018   INR 1.19 05/17/2017   INR 1.33 11/27/2014   Studies/Results: No results  found. Medications: . sodium chloride    . sodium chloride    . vancomycin Stopped (11/24/18 1643)   . atorvastatin  40 mg Oral QHS  . Chlorhexidine Gluconate Cloth  6 each Topical Q0600  . doxercalciferol  3 mcg Intravenous Q M,W,F-HD  . feeding supplement (PRO-STAT SUGAR FREE 64)  30 mL Oral BID  . ferric citrate  210 mg Oral TID WC  . heparin  5,000 Units Subcutaneous Q8H  . hydrocerin   Topical BID  . insulin aspart  0-9 Units Subcutaneous TID WC  . insulin aspart protamine- aspart  5 Units Subcutaneous BID WC  . methocarbamol  1,000 mg Oral QID  . multivitamin with minerals  1 tablet Oral Q supper  . pneumococcal 23 valent vaccine  0.5 mL Intramuscular Tomorrow-1000  . polyethylene glycol  17 g Oral Daily  . primidone  50 mg Oral QHS  . senna-docusate  2 tablet Oral BID  . timolol  1 drop Left Eye BID

## 2018-11-25 NOTE — Progress Notes (Signed)
Physical Therapy Session Note  Patient Details  Name: [name] Sr. MRN: [id number] Date of Birth: [birth year]  Today's Date: 11/25/2018 PT Individual Time: 1000-1100 PT Individual Time Calculation (min): 60 min   Short Term Goals: Week 1:  PT Short Term Goal 1 (Week 1): = LTG  Skilled Therapeutic Interventions/Progress Updates:    Pt received seated in bed, agreeable to PT. Pt reports 4/10 pain in L residual limb, receives pain medication from RN at beginning of therapy session. Supine to sitting EOB with Supervision. Squat pivot transfer bed to w/c with CGA. Manual w/c propulsion 2 x 150 ft with BUE and Supervision with increased time needed to complete. Session focus on safe w/c setup for transfers w/c to/from mat table with mod cueing for management of w/c parts and CGA for squat pivot transfer. Seated BLE therex x 10 reps. Switched pt's w/c out for more appropriately configured one and set up with residual limb support for LLE. Sit to stand 3 x 5 reps from elevated mat table gradually decreasing in height to RW with CGA to min A. Assisted pt back to bed at end of therapy session, CGA for squat pivot transfer, Supervision for sit to supine. Pt left seated in bed with needs in reach and bed alarm in place at end of therapy session.  Therapy Documentation Precautions:  Precautions Precautions: Fall Precaution Comments: L BKA Required Braces or Orthoses: (No limb protector in room) Restrictions Weight Bearing Restrictions: No LLE Weight Bearing: Non weight bearing   Therapy/Group: Individual Therapy  Excell Seltzer, PT, DPT  11/25/2018, 12:15 PM

## 2018-11-25 NOTE — Progress Notes (Signed)
Patient ID: [name] WADHWA Sr., male   DOB: 02-16-1951, 67 y.o.   MRN: [id number]   [name] NANNA Sr. is a 67 y.o. male who is admitted for CIR with deficits with mobility transfers self-care and endurance secondary to a recent BKA.  Past Medical History:  Diagnosis Date  . Cardiomyopathy ([place])   . Cataract    right eye  . Diabetes mellitus    type 2  . Diabetic foot ulcer ([place])   . Diabetic infection of right foot ([place])   . Diabetic peripheral neuropathy ([place])   . ESRD (end stage renal disease) ([place])    m-w-f [facility] [place] [address]  . Glaucoma    left eye  . Headache   . HTN (hypertension)   . Osteomyelitis ([place])   . Peripheral vascular disease ([place])    critical limb ischemia     Subjective: No new complaints. No new problems.  Pain reasonably well-controlled  Objective: Vital signs in last 24 hours: Temp:  [98 F (36.7 C)-99.4 F (37.4 C)] 99.4 F (37.4 C) (11/30 0522) Pulse Rate:  [74-90] 78 (11/30 0522) Resp:  [18-19] 19 (11/30 0522) BP: (112-144)/(47-72) 123/65 (11/30 0522) SpO2:  [97 %-100 %] 97 % (11/30 0522) Weight:  [90.9 kg-94.4 kg] 94.4 kg (11/30 0522) Weight change: 1.3 kg Last BM Date: 11/24/18  Intake/Output from previous day: 11/29 0701 - 11/30 0700 In: 100 [P.O.:100] Out: 2000  Last cbgs: CBG (last 3)  Recent Labs    11/24/18 1813 11/24/18 2044 11/25/18 0731  GLUCAP 108* 135* 98   Lab Results  Component Value Date   HGBA1C 7.7 (H) 11/20/2018    Patient Vitals for the past 24 hrs:  BP Temp Temp src Pulse Resp SpO2 Weight  11/25/18 0522 123/65 99.4 F (37.4 C) - 78 19 97 % 94.4 kg  11/25/18 0500 - - - - - - 94.4 kg  11/24/18 2041 (!) 113/59 98.6 F (37 C) Tympanic 79 18 100 % -  11/24/18 1752 (!) 115/57 99.3 F (37.4 C) - 90 18 100 % -  11/24/18 1702 129/64 98 F (36.7 C) Oral 88 18 100 % 90.9 kg  11/24/18 1700 (!) 119/59 - - 89 - - -  11/24/18 1630 121/62 - - 84 - - -  11/24/18 1600 112/61 - - 87 - - -   11/24/18 1530 (!) 117/58 - - 88 - - -  11/24/18 1500 124/62 - - 85 - - -  11/24/18 1430 128/64 - - 82 - - -  11/24/18 1400 (!) 134/47 - - 79 - - -  11/24/18 1330 139/61 - - 78 - - -  11/24/18 1300 (!) 144/71 - - 77 - - -  11/24/18 1252 (!) 144/71 - - 74 - - -  11/24/18 1247 132/70 - - 75 - - -  11/24/18 1237 (!) 141/72 98.3 F (36.8 C) Oral 76 18 100 % 94.4 kg      Physical Exam General: No apparent distress   HEENT: Unremarkable Lungs: Normal effort. Lungs clear to auscultation, no crackles or wheezes. Cardiovascular: Regular rate and rhythm, no edema Abdomen: S/NT/ND; BS(+) Extremities.  Amputation of digits right lower extremity Neurological: No new neurological deficits Wounds: Status post left BKA with bandage in place Skin: clear right plantar foot ulcer with dressing; healing abrasion over left knee Mental state: Alert, oriented, cooperative    Lab Results: BMET    Component Value Date/Time   NA 128 (L) 11/24/2018 1321  K 4.5 11/24/2018 1321   CL 91 (L) 11/24/2018 1321   CO2 23 11/24/2018 1321   GLUCOSE 115 (H) 11/24/2018 1321   BUN 67 (H) 11/24/2018 1321   CREATININE 10.51 (H) 11/24/2018 1321   CALCIUM 7.8 (L) 11/24/2018 1321   GFRNONAA 5 (L) 11/24/2018 1321   GFRAA 5 (L) 11/24/2018 1321   CBC    Component Value Date/Time   WBC 12.3 (H) 11/24/2018 1321   RBC 2.96 (L) 11/24/2018 1321   HGB 8.2 (L) 11/24/2018 1321   HCT 27.8 (L) 11/24/2018 1321   PLT 410 (H) 11/24/2018 1321   MCV 93.9 11/24/2018 1321   MCH 27.7 11/24/2018 1321   MCHC 29.5 (L) 11/24/2018 1321   RDW 17.1 (H) 11/24/2018 1321   LYMPHSABS 1.5 11/17/2018 1346   MONOABS 1.2 (H) 11/17/2018 1346   EOSABS 0.3 11/17/2018 1346   BASOSABS 0.0 11/17/2018 1346    Medications: I have reviewed the patient's current medications.  Assessment/Plan:  Functional deficits following left BKA.  Continue CIR DVT prophylaxis.  Continue heparin T2 DM continue Lantus.  Fasting blood sugar 98 ESRD.   Continue MWF dialysis Hypertension.  Well controlled.  We will continue present regimen and continue to monitor    Length of stay, days: 3  Marletta Lor , MD 11/25/2018, 10:07 AM

## 2018-11-25 NOTE — Progress Notes (Signed)
Social Work Assessment and Plan  Patient Details  Name: [name] LAMA Sr. MRN: [id number] Date of Birth: 07/26/1951  Today's Date: 11/24/2018  Problem List:  Patient Active Problem List   Diagnosis Date Noted  . Elevated blood pressure reading   . S/P unilateral BKA (below knee amputation), left ([place])   . Muscle spasms of both lower extremities   . [redacted] weeks gestation of pregnancy   . Diabetes mellitus type 2 in nonobese (HCC)   . Drug induced constipation   . Unilateral complete BKA, left, initial encounter ([place]) 11/22/2018  . Postoperative pain   . Type 2 diabetes mellitus with peripheral neuropathy (HCC)   . Bacteremia   . Anemia of chronic disease   . MRSA bacteremia 11/21/2018  . Leukocytosis   . Tobacco abuse   . Post-operative pain   . Normal coronary arteries 10/31/2018  . Syncope, non cardiac 10/31/2018  . Sepsis ([place]) 05/10/2018  . HLD (hyperlipidemia) 05/10/2018  . Diabetic ulcer of foot associated with diabetes mellitus due to underlying condition, with necrosis of muscle ([place]) 01/27/2018  . Onychomycosis of multiple toenails with type 1 diabetes mellitus ([place]) 01/27/2018  . Onychomycosis of multiple toenails with type 2 diabetes mellitus and peripheral angiopathy ([place]) 10/05/2017  . ESRD on hemodialysis ([place])   . Osteomyelitis of right foot ([place]) 02/03/2017  . Type 2 diabetes mellitus with left diabetic foot ulcer ([place]) 01/21/2017  . Osteomyelitis of left foot ([place]) 09/11/2016  . Peripheral arterial disease ([place]) 08/12/2015  . Paroxysmal supraventricular tachycardia ([place]) 01/30/2015  . Occult blood in stools 11/25/2014  . Acute systolic heart failure ([place]) 11/25/2014  . Congestive dilated cardiomyopathy ([place]) 11/24/2014  . Visual changes 11/23/2014  . Cardiomegaly 11/23/2014  . Benign essential HTN 11/23/2014  . 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 Anemia due to chronic blood loss 11/23/2014  . 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 Symptomatic anemia   . Cellulitis and abscess of hand 01/28/2012  . HTN (hypertension) 01/28/2012  . Hyponatremia 01/28/2012  . Normocytic anemia 01/28/2012  . Sinusitis 01/28/2012   Past Medical History:  Past Medical History:  Diagnosis Date  . Cardiomyopathy ([place])   . Cataract    right eye  . Diabetes mellitus    type 2  . Diabetic foot ulcer ([place])   . Diabetic infection of right foot ([place])   . Diabetic peripheral neuropathy ([place])   . ESRD (end stage renal disease) ([place])    m-w-f [facility] [place] [address]  . Glaucoma    left eye  . Headache   . HTN (hypertension)   . Osteomyelitis ([place])   . Peripheral vascular disease ([place])    critical limb ischemia   Past Surgical History:  Past Surgical History:  Procedure Laterality Date  . ABDOMINAL AORTOGRAM N/A 05/04/2018   Procedure: ABDOMINAL AORTOGRAM;  Surgeon: Conrad Appling, MD;  Location: [facility];  Service: Cardiovascular;  Laterality: N/A;  . AMPUTATION Left 11/20/2018   Procedure: AMPUTATION BELOW KNEE;  Surgeon: Dorna Leitz, MD;  Location: [facility];  Service: Orthopedics;  Laterality: Left;  . AMPUTATION TOE Right 01/21/2017   Procedure: AMPUTATION 2ND TOE RIGHT FOOT ;  Surgeon: Dorna Leitz, MD;  Location: [facility];  Service: Orthopedics;  Laterality: Right;  . AMPUTATION TOE Left 05/18/2017   Procedure: 5TH RAY AMPUTATION  and EXCISION CUBOID;  Surgeon: Dorna Leitz, MD;  Location: [facility];  Service: Orthopedics;  Laterality: Left;  . AMPUTATION TOE Bilateral 01/27/2018   Procedure: RIGHT FOOT 3RD TOE AMPUTATION WITH INCISIONAL WOUND DEBRIDEMENT. LEFT FOOT 2ND TOE AMPUTATION AND INCISIONAL WOUND DEBRIDEMENT.;  Surgeon: Dorna Leitz, MD;  Location: [facility];  Service: Orthopedics;  Laterality: Bilateral;  . AV FISTULA PLACEMENT Left 11/27/2014   Procedure: ARTERIOVENOUS (AV) FISTULA CREATION;  Surgeon: Conrad Pirtleville, MD;  Location: [facility];  Service: Vascular;  Laterality:  Left;  . CHOLECYSTECTOMY    . COLONOSCOPY N/A 11/28/2014   Procedure: COLONOSCOPY;  Surgeon: Ladene Artist, MD;  Location: [facility];  Service: Endoscopy;  Laterality: N/A;  . ESOPHAGOGASTRODUODENOSCOPY N/A 11/28/2014   Procedure: ESOPHAGOGASTRODUODENOSCOPY (EGD);  Surgeon: Ladene Artist, MD;  Location: [facility] ENDOSCOPY;  Service: Endoscopy;  Laterality: N/A;  . EYE SURGERY     retina and cataract left eye surgery  . I&D EXTREMITY  01/28/2012   Procedure: IRRIGATION AND DEBRIDEMENT EXTREMITY;  Surgeon: Paulene Floor, MD;  Location: [facility];  Service: Orthopedics;  Laterality: Left;  irrigation and drainage left hand  . I&D EXTREMITY  01/30/2012   Procedure: IRRIGATION AND DEBRIDEMENT EXTREMITY;  Surgeon: Paulene Floor, MD;  Location: [facility];  Service: Orthopedics;  Laterality: Left;  flexor teno synovectomy and extensor tenosynovectomy arthrosynovectomy wristjoint  . I&D EXTREMITY  02/01/2012   Procedure: IRRIGATION AND DEBRIDEMENT EXTREMITY;  Surgeon: Paulene Floor, MD;  [facility]: [facility];  Service: Orthopedics;  Laterality: Left;  Left Wrist, Hand and Forearm  . IRRIGATION AND DEBRIDEMENT FOOT Left 01/21/2017   Procedure: DEBRIDEMENT LEFT PLANTAR WOUND;  Surgeon: Dorna Leitz, MD;  Location: [facility];  Service: Orthopedics;  Laterality: Left;  . Left 4th toe amputation    . LEFT AND RIGHT HEART CATHETERIZATION WITH CORONARY ANGIOGRAM N/A 11/26/2014   Procedure: LEFT AND RIGHT HEART CATHETERIZATION WITH CORONARY ANGIOGRAM;  Surgeon: Leonie Man, MD;  Location: [facility] LAB;  Service: Cardiovascular;  Laterality: N/A;  . LOWER EXTREMITY ANGIOGRAPHY Bilateral 05/04/2018   Procedure: Lower Extremity Angiography;  Surgeon: Conrad Kinmundy, MD;  Location: [facility];  Service: Cardiovascular;  Laterality: Bilateral;  . LOWER EXTREMITY ANGIOGRAPHY Left 05/11/2018   Procedure: LOWER EXTREMITY ANGIOGRAPHY;  Surgeon: Conrad Mills, MD;  Location: [facility];  Service:  Cardiovascular;  Laterality: Left;  . PERIPHERAL VASCULAR BALLOON ANGIOPLASTY Left 05/11/2018   Procedure: PERIPHERAL VASCULAR BALLOON ANGIOPLASTY;  Surgeon: Conrad Palmetto, MD;  Location: [facility];  Service: Cardiovascular;  Laterality: Left;  Posterior tibial  . PERIPHERAL VASCULAR INTERVENTION Left 05/11/2018   Procedure: PERIPHERAL VASCULAR INTERVENTION;  Surgeon: Conrad [place], MD;  Location: [facility];  Service: Cardiovascular;  Laterality: Left;  tibioperoneal trunk  . TEE WITHOUT CARDIOVERSION N/A 11/20/2018   Procedure: TRANSESOPHAGEAL ECHOCARDIOGRAM (TEE);  Surgeon: Jerline Pain, MD;  Location: [facility];  Service: Cardiovascular;  Laterality: N/A;  . TONSILLECTOMY     Social History:  reports that he has quit smoking. His smoking use included cigarettes. He has a 2.00 pack-year smoking history. He has never used smokeless tobacco. He reports that he does not drink alcohol or use drugs.  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   Abuse/Neglect Abuse/Neglect Assessment Can Be Completed: Yes Physical Abuse: Denies Verbal Abuse: Denies Sexual Abuse: Denies Exploitation of patient/patient's resources: Denies Self-Neglect: Denies  Emotional Status Pt's affect, behavior  and adjustment status: Pt reports feeling well emotionally and physically, at least better than he has been. Recent Psychosocial Issues: Pt also has dialysis Monday, Wedenesday, Friday Psychiatric History: none reported Substance Abuse History: none reported  Patient / Family Perceptions, Expectations & Goals Pt/Family understanding of illness & functional limitations: Pt reports a good understanding of his condition/limitations.  CSW has not met pt's family yet. Premorbid pt/family roles/activities: Pt and his wife enjoy going to see movies together, especially true stories.  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 Anticipated changes in roles/activities/participation: Pt hopes to resume activities as he is able. Pt/family expectations/goals: Pt wants "to learn how to walk on one leg."  [employer]: None Premorbid Home Care/DME Agencies: Other (Comment)(Pt has had home health in the past, but cannot remember agency.) Transportation available at discharge: family Resource referrals recommended: Neuropsychology, Support group (specify)(Amputee Support Group)  Discharge Planning Living Arrangements: Spouse/significant other Support Systems: Children, Spouse/significant other, Other relatives, Friends/neighbors, Church/faith community Type of Residence: Private residence Insurance Resources: [employer], Multimedia programmer (specify)(AARP supplement) Financial Resources: Radio broadcast assistant Screen Referred: No Living Expenses: Medical laboratory scientific officer Management: Patient, Spouse Does the patient have any problems obtaining your medications?: No Home Management: Pt and wife shared these responsibilities. Patient/Family Preliminary Plans: Pt's wife will probably take some time off to be with pt at home and other family members will help with pt when wife is working. Social Work Anticipated Follow Up Needs: HH/OP, Support Group Expected length of stay: ELOS 7 to 10  days  Clinical Impression CSW met with pt to introduce self and role of CSW, as well as to complete assessment.  Pt reports feeling better then he has recently.  He is ready for CIR and motivated to work hard.  He wants to get home and to be safe there.  Pt goes to dialysis 3 times a week.  He has good friends and family who will assist as needed.  Pt had HH after a surgery, but cannot remember the agency.  Pt saved his rolling walker and BSC from surgery.   Pt has no needs/concerns/questions at this time.  CSW will refer pt to Neuropsychologist for coping.  CSW will continue to follow and assist as needed.  Hector Venne, Silvestre Mesi 11/24/2018, 5:02 PM

## 2018-11-25 NOTE — Progress Notes (Signed)
Occupational Therapy Session Note  Patient Details  Name: VINCENT STREATER Sr. MRN: [id number] Date of Birth: [birth year]  Today's Date: 11/25/2018 OT Individual Time: 4734-0370 OT Individual Time Calculation (min): 75 min    Skilled Therapeutic Interventions/Progress Updates: patient participation in skilled OT as follows:   Supine in bed to w/c transfer=CGA to scoot to his left.   Bathing in w/c at sink=setup for UB; close S for lateral leans to pericleanse.   He stated he was too nervous to stand at sink to [place] today.   UB dressing=setup (wore gown...had no other clothes);  Patient elected not to wash feet.     After session he did ask to practice sit to stand x 2.   He completed with contact guard assist.   He groomed seated in w/c at sink.  He was left seated in his w/c at the end of session with call bell and phone within reach.     Therapy Documentation Precautions:  Precautions Precautions: Fall Precaution Comments: L BKA Required Braces or Orthoses: (No limb protector in room) Restrictions Weight Bearing Restrictions: No LLE Weight Bearing: Non weight bearing  Pain: Pain Assessment Pain Score: 0-No pain   Therapy/Group: Individual Therapy  Alfredia Ferguson [facility] 11/25/2018, 3:02 PM

## 2018-11-25 NOTE — Progress Notes (Signed)
Occupational Therapy Session Note  Patient Details  Name: [name] EVERETT Sr. MRN: [id number] Date of Birth: [birth year]  Today's Date: 11/25/2018 OT Individual Time: 2876-8115 OT Individual Time Calculation (min): 57 min    Short Term Goals: Week 1:  OT Short Term Goal 1 (Week 1): LTG=STG 2/2 ELOS  Skilled Therapeutic Interventions/Progress Updates:    1:1. Pt greeted in bed with wife present. Pt with no c/o pian. Pt completes squat pivot transfer with CGA and VC for w/c parts management. Pt educated on residual limb care, use of inspection mirror (issued) and figure 8 wrapping for shaping and compression. Pt completes figure 8 wrapping on residual limb with mod VC for technique. Pt instructed to practice again when RN changes dressing. Pt uses inspection mirror to look at blistered skin on end of residual limb (RN aware). Pt completes stand pivot transfer with min A w/c<>EOM and hops laterally with RW EOM with CGA. Eixted session with pt seated in w/c with wife present supervising and call light in reach  Therapy Documentation Precautions:  Precautions Precautions: Fall Precaution Comments: L BKA Required Braces or Orthoses: (No limb protector in room) Restrictions Weight Bearing Restrictions: No LLE Weight Bearing: Non weight bearing General:    Therapy/Group: Individual Therapy  Tonny Branch 11/25/2018, 2:29 PM

## 2018-11-26 ENCOUNTER — Inpatient Hospital Stay (HOSPITAL_COMMUNITY): Payer: Medicare Other

## 2018-11-26 ENCOUNTER — Inpatient Hospital Stay (HOSPITAL_COMMUNITY): Payer: Medicare Other | Admitting: Occupational Therapy

## 2018-11-26 DIAGNOSIS — Z992 Dependence on renal dialysis: Secondary | ICD-10-CM | POA: Diagnosis not present

## 2018-11-26 DIAGNOSIS — E1122 Type 2 diabetes mellitus with diabetic chronic kidney disease: Secondary | ICD-10-CM | POA: Diagnosis not present

## 2018-11-26 DIAGNOSIS — N186 End stage renal disease: Secondary | ICD-10-CM | POA: Diagnosis not present

## 2018-11-26 LAB — GLUCOSE, CAPILLARY
Glucose-Capillary: 96 mg/dL (ref 70–99)
Glucose-Capillary: 94 mg/dL (ref 70–99)
Glucose-Capillary: 109 mg/dL — ABNORMAL HIGH (ref 70–99)
Glucose-Capillary: 99 mg/dL (ref 70–99)

## 2018-11-26 NOTE — Progress Notes (Signed)
Physical Therapy Session Note  Patient Details  Name: [name] VELDHUIZEN Sr. MRN: [id number] Date of Birth: 1951/08/22  Today's Date: 11/26/2018 PT Individual Time: 0900-0959 PT Individual Time Calculation (min): 59 min   Short Term Goals: Week 1:  PT Short Term Goal 1 (Week 1): = LTG  Skilled Therapeutic Interventions/Progress Updates:    Pt supine in bed upon PT arrival, agreeable to therapy tx and denies pain. Pt propelled w/c from room>gym with supervision using B UEs x 150 ft. Pt performed squat pivot transfer from w/c>mat with CGA and verbal cues for techniques/set up. Pt transferred sitting<>supine this session with supervision. Pt performed therex on therapist mat this session including 2 x 10 of the following for strengthening: supine SLR, sidelying hip abduction, sidelying hip extension, prone hip extension. Therapist re wrapped pt's distal limb with ace wrap and educated pt on purpose including edema control and limb shaping. Therapist discussed with pt about getting black shrinker sock to take the place of ace wrap. Pt transferred back to prone on elbows for hip flexor stretch, maintained prone position x 4 minutes. Pt transferred to long sitting position with supervision and performed B LE hamstring stretch 2 x 1 minute, educated on importance of performing these stretches at home to preserve ROM. Pt seated edge of mat worked on Geologist, engineering therapy this session for management of residual limb phantom pain and phantom sensations, therapist providing on education for use of this technique. Pt transferred to w/c squat pivot CGA and propelled back to room with supervision. CGA transfer to bed, left supine with needs in reach and chair alarm set.   Therapy Documentation Precautions:  Precautions Precautions: Fall Precaution Comments: L BKA Required Braces or Orthoses: (No limb protector in room) Restrictions Weight Bearing Restrictions: Yes LLE Weight Bearing: Non weight  bearing    Therapy/Group: Individual Therapy  Netta Corrigan, PT, DPT 11/26/2018, 7:53 AM

## 2018-11-26 NOTE — Progress Notes (Signed)
Occupational Therapy Session Note  Patient Details  Name: [name] STUTSMAN Sr. MRN: [id number] Date of Birth: 16-Aug-1951  Today's Date: 11/26/2018 OT Group Time: 1101-1201 OT Group Time Calculation (min): 60 min  Short Term Goals: Week 1:  OT Short Term Goal 1 (Week 1): LTG=STG 2/2 ELOS  Skilled Therapeutic Interventions/Progress Updates:    Pt engaged in therapeutic w/c level dance group focusing on patient choice, UE/LE strengthening, salience, activity tolerance, and social participation. Pt was guided through various dance-based exercises involving UEs/LEs and trunk. All music was selected by group members. Emphasis placed on UE strengthening and endurance. Pt actively participated throughout session, requested songs, and conversed with neighbor frequently on Rt side (who was also an amputee pt). At end of session he was escorted back to room, set up for lunch, and left with all needs within reach.    Therapy Documentation Precautions:  Precautions Precautions: Fall Precaution Comments: L BKA Required Braces or Orthoses: (No limb protector in room) Restrictions Weight Bearing Restrictions: Yes LLE Weight Bearing: Non weight bearing Pain: No s/s pain during session  Pain Assessment Pain Scale: 0-10 Pain Score: 0-No pain ADL:     Therapy/Group: Group Therapy  Caine Barfield A Lakeith Careaga 11/26/2018, 12:50 PM

## 2018-11-26 NOTE — Progress Notes (Signed)
Patient ID: [name] ROPP Sr., male   DOB: 04/02/1951, 67 y.o.   MRN: [id number]   [name] TANZI Sr. is a 67 y.o. male who is admitted for CIR with functional deficits in mobility and self-care related to a recent BKA  Past Medical History:  Diagnosis Date  . Cardiomyopathy ([place])   . Cataract    right eye  . Diabetes mellitus    type 2  . Diabetic foot ulcer ([place])   . Diabetic infection of right foot ([place])   . Diabetic peripheral neuropathy ([place])   . ESRD (end stage renal disease) ([place])    m-w-f [facility] [place] [address]  . Glaucoma    left eye  . Headache   . HTN (hypertension)   . Osteomyelitis ([place])   . Peripheral vascular disease ([place])    critical limb ischemia     Subjective: No new complaints. No new problems. Slept well.  Spasm involving left leg much improved  Objective: Vital signs in last 24 hours: Temp:  [98.7 F (37.1 C)-98.9 F (37.2 C)] 98.9 F (37.2 C) (12/01 0535) Pulse Rate:  [72-91] 72 (12/01 0535) Resp:  [16-18] 18 (12/01 0535) BP: (127-140)/(67-70) 140/70 (12/01 0535) SpO2:  [96 %-100 %] 100 % (12/01 0535) Weight:  [94.1 kg] 94.1 kg (12/01 0535) Weight change: -0.3 kg Last BM Date: 11/24/18  Intake/Output from previous day: 11/30 0701 - 12/01 0700 In: 720 [P.O.:720] Out: -  Last cbgs: CBG (last 3)  Recent Labs    11/25/18 1632 11/25/18 2131 11/26/18 0653  GLUCAP 151* 86 94   Patient Vitals for the past 24 hrs:  BP Temp Pulse Resp SpO2 Weight  11/26/18 0535 140/70 98.9 F (37.2 C) 72 18 100 % 94.1 kg  11/25/18 2005 135/67 98.8 F (37.1 C) 77 16 99 % -  11/25/18 1504 127/67 98.7 F (37.1 C) 91 16 96 % -     Physical Exam General: No apparent distress   HEENT: Clear  Lungs: Normal effort. Lungs clear to auscultation, no crackles or wheezes. Cardiovascular: Regular rate and rhythm, no edema Abdomen: S/NT/ND; BS(+) Musculoskeletal:  unchanged Neurological: No new neurological deficits Wounds: Status post left  BKA Skin: clear;  dressing right foot Mental state: Alert, oriented, cooperative    Lab Results: BMET    Component Value Date/Time   NA 128 (L) 11/24/2018 1321   K 4.5 11/24/2018 1321   CL 91 (L) 11/24/2018 1321   CO2 23 11/24/2018 1321   GLUCOSE 115 (H) 11/24/2018 1321   BUN 67 (H) 11/24/2018 1321   CREATININE 10.51 (H) 11/24/2018 1321   CALCIUM 7.8 (L) 11/24/2018 1321   GFRNONAA 5 (L) 11/24/2018 1321   GFRAA 5 (L) 11/24/2018 1321   CBC    Component Value Date/Time   WBC 12.3 (H) 11/24/2018 1321   RBC 2.96 (L) 11/24/2018 1321   HGB 8.2 (L) 11/24/2018 1321   HCT 27.8 (L) 11/24/2018 1321   PLT 410 (H) 11/24/2018 1321   MCV 93.9 11/24/2018 1321   MCH 27.7 11/24/2018 1321   MCHC 29.5 (L) 11/24/2018 1321   RDW 17.1 (H) 11/24/2018 1321   LYMPHSABS 1.5 11/17/2018 1346   MONOABS 1.2 (H) 11/17/2018 1346   EOSABS 0.3 11/17/2018 1346   BASOSABS 0.0 11/17/2018 1346     Medications: I have reviewed the patient's current medications.  Assessment/Plan:  Functional deficits following left BKA.  Continue CIR End-stage renal disease.  Continue MWF HD T2DM.  Remains tightly controlled DVT prophylaxis.  Continue heparin Essential hypertension well-controlled  Length of stay, days: 4  Marletta Lor , MD 11/26/2018, 9:59 AM

## 2018-11-26 NOTE — Progress Notes (Signed)
Occupational Therapy Session Note  Patient Details  Name: [name] STRAUS Sr. MRN: [id number] Date of Birth: 1951/04/28  Today's Date: 11/26/2018 OT Individual Time: 3968-8648 OT Individual Time Calculation (min): 75 min    Short Term Goals: Week 1:  OT Short Term Goal 1 (Week 1): LTG=STG 2/2 ELOS  Skilled Therapeutic Interventions/Progress Updates:    1:1. Pt received seated in bed with no c/o pain. Pt agreeable to bathe and dress this morning. Supine>sit>squat pivot transfer to w/c with Supervision. Pt completes bathing and dressing seated for all bathing except peri/buttock cleansing in standing with MIN A for balance. Pt directs OT to pick out preferred clothing and dons UB clothing with set up and LB clothing with steady A to advance pants past hips. Pt completes grooming in w/c at sink with set up. Pt completes standing bean bag toss with CGA up to min A for anterior LOB with RW to recover. Pt reaches inmin-mod ranges outside BOS to obtain beanbag and toss at target. Pt requesting to complete UBE on level 1.5 8 min with VC for maintaining 35-40 rotations per min. Exited session with pt seated in bed, call light in reach, exit alarm and all needs met.   Therapy Documentation Precautions:  Precautions Precautions: Fall Precaution Comments: L BKA Required Braces or Orthoses: (No limb protector in room) Restrictions Weight Bearing Restrictions: Yes LLE Weight Bearing: Non weight bearing General:   Vital Signs: Therapy Vitals Temp: 98.9 F (37.2 C) Pulse Rate: 72 Resp: 18 BP: 140/70 Patient Position (if appropriate): Lying Oxygen Therapy SpO2: 100 % O2 Device: Room Air Pain:   ADL:   Vision   Perception    Praxis   Exercises:   Other Treatments:     Therapy/Group: Individual Therapy  Tonny Branch 11/26/2018, 7:11 AM

## 2018-11-26 NOTE — Progress Notes (Signed)
Occupational Therapy Session Note  Patient Details  Name: [name] KAGAWA Sr. MRN: [id number] Date of Birth: [birth year]  Today's Date: 11/26/2018 OT Individual Time: 1520-1620 OT Individual Time Calculation (min): 60 min    Short Term Goals: Week 1:  OT Short Term Goal 1 (Week 1): LTG=STG 2/2 ELOS  Skilled Therapeutic Interventions/Progress Updates:    1:1. Pt with no c/o pain. Pt asking questions about access to home and renting ramp. Provided pt with information on rise v run of ramp and referred pt to discuss with CSW about options for renting v purchasing ramp. Pt verbalizes understanding. Pt propels w/c to/from all tx destinations. Pt reporting w/c will not be able to fit into bathroom. Pt educated on use of tbu bench to access shower at home. Pt able to hop with MIN A and RW w/c>TTB ~5 feet and transfer onto TTB. Pt educated on hand held shower head use, curtain adaptations and leaning laterally to wash buttocks when bathing shower level for safety/energy conservation. Pt completes 1x15 UB therex for BUE strengthening with 5# dowel rod: wrist flex/ext, elbow flex/ext, horizontal ab/aduct, chest press, shoulder press. Exited session with pt seated in bed with call light in reach and all needs met.  Therapy Documentation Precautions:  Precautions Precautions: Fall Precaution Comments: L BKA Required Braces or Orthoses: (No limb protector in room) Restrictions Weight Bearing Restrictions: Yes LLE Weight Bearing: Non weight bearing   Therapy/Group: Individual Therapy  Tonny Branch 11/26/2018, 4:30 PM

## 2018-11-27 ENCOUNTER — Inpatient Hospital Stay (HOSPITAL_COMMUNITY): Payer: Medicare Other | Admitting: Physical Therapy

## 2018-11-27 ENCOUNTER — Inpatient Hospital Stay (HOSPITAL_COMMUNITY): Payer: Medicare Other

## 2018-11-27 ENCOUNTER — Encounter (HOSPITAL_COMMUNITY): Payer: Medicare Other | Admitting: Psychology

## 2018-11-27 ENCOUNTER — Inpatient Hospital Stay (HOSPITAL_COMMUNITY): Payer: Medicare Other | Admitting: Occupational Therapy

## 2018-11-27 DIAGNOSIS — I739 Peripheral vascular disease, unspecified: Secondary | ICD-10-CM

## 2018-11-27 LAB — RENAL FUNCTION PANEL
Albumin: 2.5 g/dL — ABNORMAL LOW (ref 3.5–5.0)
Anion gap: 16 — ABNORMAL HIGH (ref 5–15)
BUN: 61 mg/dL — ABNORMAL HIGH (ref 8–23)
CO2: 23 mmol/L (ref 22–32)
Calcium: 7.9 mg/dL — ABNORMAL LOW (ref 8.9–10.3)
Chloride: 90 mmol/L — ABNORMAL LOW (ref 98–111)
Creatinine, Ser: 11.05 mg/dL — ABNORMAL HIGH (ref 0.61–1.24)
GFR calc Af Amer: 5 mL/min — ABNORMAL LOW (ref >60)
GFR calc non Af Amer: 4 mL/min — ABNORMAL LOW (ref >60)
Glucose, Bld: 95 mg/dL (ref 70–99)
Phosphorus: 4 mg/dL (ref 2.5–4.6)
Potassium: 4.4 mmol/L (ref 3.5–5.1)
Sodium: 129 mmol/L — ABNORMAL LOW (ref 135–145)

## 2018-11-27 LAB — CBC
HCT: 26.4 % — ABNORMAL LOW (ref 39.0–52.0)
Hemoglobin: 8.2 g/dL — ABNORMAL LOW (ref 13.0–17.0)
MCH: 28.2 pg (ref 26.0–34.0)
MCHC: 31.1 g/dL (ref 30.0–36.0)
MCV: 90.7 fL (ref 80.0–100.0)
Platelets: 456 10*3/uL — ABNORMAL HIGH (ref 150–400)
RBC: 2.91 MIL/uL — ABNORMAL LOW (ref 4.22–5.81)
RDW: 17 % — ABNORMAL HIGH (ref 11.5–15.5)
WBC: 10.7 10*3/uL — ABNORMAL HIGH (ref 4.0–10.5)
nRBC: 0 % (ref 0.0–0.2)

## 2018-11-27 LAB — GLUCOSE, CAPILLARY
Glucose-Capillary: 115 mg/dL — ABNORMAL HIGH (ref 70–99)
Glucose-Capillary: 79 mg/dL (ref 70–99)
Glucose-Capillary: 78 mg/dL (ref 70–99)
Glucose-Capillary: 101 mg/dL — ABNORMAL HIGH (ref 70–99)
Glucose-Capillary: 105 mg/dL — ABNORMAL HIGH (ref 70–99)

## 2018-11-27 LAB — IRON AND TIBC
Iron: 35 ug/dL — ABNORMAL LOW (ref 45–182)
Saturation Ratios: 20 % (ref 17.9–39.5)
TIBC: 172 ug/dL — ABNORMAL LOW (ref 250–450)
UIBC: 137 ug/dL

## 2018-11-27 LAB — FERRITIN: Ferritin: 833 ng/mL — ABNORMAL HIGH (ref 24–336)

## 2018-11-27 LAB — VANCOMYCIN, RANDOM: Vancomycin Rm: 23

## 2018-11-27 MED ORDER — LIDOCAINE-PRILOCAINE 2.5-2.5 % EX CREA: 1.0000 "application " | TOPICAL_CREAM | CUTANEOUS | Status: DC | PRN

## 2018-11-27 MED ORDER — TRAMADOL HCL 50 MG PO TABS
ORAL_TABLET | ORAL | Status: AC
  Filled 2018-11-27: qty 1

## 2018-11-27 MED ORDER — VANCOMYCIN HCL IN DEXTROSE 1-5 GM/200ML-% IV SOLN
INTRAVENOUS | Status: AC
  Administered 2018-11-27: 1000 mg via INTRAVENOUS
  Filled 2018-11-27: qty 200

## 2018-11-27 MED ORDER — PENTAFLUOROPROP-TETRAFLUOROETH EX AERO: 1.0000 "application " | INHALATION_SPRAY | CUTANEOUS | Status: DC | PRN

## 2018-11-27 MED ORDER — SODIUM CHLORIDE 0.9 % IV SOLN: 100.0000 mL | INTRAVENOUS | Status: DC | PRN

## 2018-11-27 MED ORDER — LIDOCAINE HCL (PF) 1 % IJ SOLN: 5.0000 mL | INTRAMUSCULAR | Status: DC | PRN

## 2018-11-27 MED ORDER — DOXERCALCIFEROL 4 MCG/2ML IV SOLN
INTRAVENOUS | Status: AC
  Administered 2018-11-27: 3 ug via INTRAVENOUS
  Filled 2018-11-27: qty 2

## 2018-11-27 MED ORDER — DARBEPOETIN ALFA 25 MCG/0.42ML IJ SOSY
PREFILLED_SYRINGE | INTRAMUSCULAR | Status: AC
  Administered 2018-11-27: 25 ug via INTRAVENOUS
  Filled 2018-11-27: qty 0.42

## 2018-11-27 NOTE — Progress Notes (Signed)
Physical Therapy Session Note  Patient Details  Name: [name] CAPRARO Sr. MRN: [id number] Date of Birth: [birth year]  Today's Date: 11/27/2018 PT Individual Time:0800  - 0900, 60 min     Short Term Goals: Week 1:  PT Short Term Goal 1 (Week 1): = LTG      Skilled Therapeutic Interventions/Progress Updates:    pt sitting up in bed, finishing breakfast.  ACE wrap intact and snug L residual limb.  PT educated pt about compression to decrease edema and shape distal end of residual limb, as well as rubbing/tapping to desensitize and decrease phantom pain/sensation.  Bed mobility to sit EOB with supervision, HOB raised.  Pt donned R shoe sitting EOB, but needed assist to tie.  Squat pivot bed> wc to L, min assist.  Pt attached bil leg rests with min cues and min assist.  Stand pivot using wall bar for toilet transfer for continent B and B. Pt completed 3/3 toileting tasks with min guard assist.  Seated strengthening exs: 20 x 1 each L short arc quad knee extension with isometric hold at end range, bil adductor squeezes.  Kinetron at level 30 and with resistance from PT, with pt seated in w/c, x 25 cycles targeting gluteal muscles, x 25 cycles  X 2  targeting quadruceps muscles.   W/c propulsion throughout unit with distant supervision, cues for efficiency and turns.  PT adjusted L footrest to accomodate pt's leg length.  Therapeutic activity in standing at table, x 2 min 2 seconds while assembling mini PVC puzzle per picture.  Mod cues for correct choices of pieces initally.  After approx 1 minute, pt's R knee flexed intermittently, with cues or extra time to extend.  No buckling to cause LOB.    Pt left resting in w/c iwht needs at hand.  Therapy Documentation Precautions:  Precautions Precautions: Fall Precaution Comments: L BKA Required Braces or Orthoses: (No limb protector in room) Restrictions Weight Bearing Restrictions: Yes LLE Weight Bearing: Non weight bearing   Pain: Pain  Assessment Pain Scale: 0-10 Pain Score: 0      Therapy/Group: Individual Therapy  Ettel Albergo 11/27/2018, 7:49 AM

## 2018-11-27 NOTE — Progress Notes (Signed)
Pharmacy Antibiotic Note  [name]. is a 67 y.o. male admitted on 11/22/2018 with sepsis secondary to MRSA bacteremia (from outside HD cultures) and osteomyelitis. Patient has ESRD on HD MWF. Pharmacy has been consulted for vancomycin dosing.  Today, his vancomycin random level is 23 mcg/mL, which is within the therapeutic range.   Plan: Vancomycin 1g IV after each HD session (MWF) Follow for HD tolerance and schedule   Height: 5\' 11"  (180.3 cm) Weight: 207 lb 3.7 oz (94 kg) IBW/kg (Calculated) : 75.3  Temp (24hrs), Avg:98.6 F (37 C), Min:98.4 F (36.9 C), Max:98.8 F (37.1 C)  Recent Labs  Lab 11/21/18 0704  11/24/18 0614 11/24/18 1321 11/27/18 0609  WBC 11.8*  --   --  12.3*  --   CREATININE 8.13*  --   --  10.51*  --   VANCORANDOM  --    < > 26  --  23   < > = values in this interval not displayed.    Estimated Creatinine Clearance: 8 mL/min (A) (by C-G formula based on SCr of 10.51 mg/dL (H)).    Allergies  Allergen Reactions  . Penicillins Hives, Swelling and Other (See Comments)    Ankle swelling Has patient had a PCN reaction causing immediate rash, facial/tongue/throat swelling, SOB or lightheadedness with hypotension: Yes Has patient had a PCN reaction causing severe rash involving mucus membranes or skin necrosis: No Has patient had a PCN reaction that required hospitalization: No Has patient had a PCN reaction occurring within the last 10 years: No If all of the above answers are "NO", then may proceed with Cephalosporin use.    Antimicrobials this admission: Cefepime 11/22>>11/24 Vancomycin 11/19>>  Microbiology results: 11/19 Outside HD BCx: MRSA 11/22 BCx: ngtd   Levester Fresh, PharmD, BCPS, BCCCP Clinical Pharmacist [phone]  Please check AMION for all [facility] numbers  11/27/2018 7:38 AM

## 2018-11-27 NOTE — Progress Notes (Signed)
Physical Therapy Session Note  Patient Details  Name: [name]. MRN: [id number] Date of Birth: [birth year]  Today's Date: 11/27/2018 PT Individual Time: 1000-1055 PT Individual Time Calculation (min): 55 min   Short Term Goals: Week 1:  PT Short Term Goal 1 (Week 1): = LTG  Skilled Therapeutic Interventions/Progress Updates:    pt performs squat pivot transfers throughout session with supervision. W/c mobility and w/c parts management with supervision in controlled and home environments including safe set up for transfers.  Prone on elbows x 5 minutes while performing 2 x 12 HS curl and 2 x 12 hip extension.  sidelying and supine therex for LE strengthening and ROM all 2 x 12.  Seated abdominal strengthening with diagonals and trunk rotation all 3 x 12 with 2kg medicine ball.  Pt left in bed with needs at hand, alarm set.  Therapy Documentation Precautions:  Precautions Precautions: Fall Precaution Comments: L BKA Required Braces or Orthoses: (No limb protector in room) Restrictions Weight Bearing Restrictions: Yes LLE Weight Bearing: Non weight bearing Pain: Pt c/o residual limb "throbbing" with activity, eases with self massage.   Therapy/Group: Individual Therapy  DONAWERTH,KAREN 11/27/2018, 10:55 AM

## 2018-11-27 NOTE — Progress Notes (Signed)
[facility] PHYSICAL MEDICINE & REHABILITATION PROGRESS NOTE  Subjective/Complaints: Pt has mild left leg pain but says it's controlled. Has concerns about stump shape/dressing  ROS: Patient denies fever, rash, sore throat, blurred vision, nausea, vomiting, diarrhea, cough, shortness of breath or chest pain,  headache, or mood change.    Objective: Vital Signs: Blood pressure 131/60, pulse 75, temperature 98.7 F (37.1 C), resp. rate 18, height 5\' 11"  (1.803 m), weight 94 kg, SpO2 97 %. No results found. Recent Labs    11/24/18 1321  WBC 12.3*  HGB 8.2*  HCT 27.8*  PLT 410*   Recent Labs    11/24/18 1321  NA 128*  K 4.5  CL 91*  CO2 23  GLUCOSE 115*  BUN 67*  CREATININE 10.51*  CALCIUM 7.8*    Physical Exam: BP 131/60 (BP Location: Right Arm)   Pulse 75   Temp 98.7 F (37.1 C)   Resp 18   Ht 5\' 11"  (1.803 m)   Wt 94 kg   SpO2 97%   BMI 28.90 kg/m  Constitutional: No distress . Vital signs reviewed. HEENT: EOMI, oral membranes moist Neck: supple Cardiovascular: RRR without murmur. No JVD    Respiratory: CTA Bilaterally without wheezes or rales. Normal effort    GI: BS +, non-tender, non-distended  Musculoskeletal:  Left stump edema tenderness, stable Right lower extremity amputation of digits  Neurological: He is alert and oriented Motor: RUE: 5/5 proximal to distal RLE: 4+/5 proximal to distal LUE: 4/5 proximal to distal (baseline), stable LUE tremor LLE: HF 4/5 (pain inhibition), KE 3/5 Sensation diminished to light touch left hand  (baseline) Skin:  Left stump with incision c/d/i, 2-3 blisters remain on posterior flap Open abrasion over left patella, stump bulbous shaped Right plantar foot ulcer  with dressing C/D/I--stable Psychiatric: pleasant  Assessment/Plan: 1. 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  Physiatrist is providing close team supervision and 24 hour management of active medical problems listed below.  Physiatrist and rehab team continue to assess barriers to discharge/monitor patient progress toward functional and medical goals  Care Tool:  Bathing    Body parts bathed by patient: Right arm, Left arm     Body parts n/a: Left lower leg   Bathing assist Assist Level: Minimal Assistance - Patient > 75%     Upper Body Dressing/Undressing Upper body dressing Upper body dressing/undressing activity did not occur (including orthotics): N/A What is the patient wearing?: Pull over shirt    Upper body assist Assist Level: Set up assist    Lower Body Dressing/Undressing Lower body dressing      What is the patient wearing?: Pants, Underwear/pull up     Lower body assist Assist for lower body dressing: Minimal Assistance - Patient > 75%     Toileting Toileting Toileting Activity did not occur (Clothing management and hygiene only): Safety/medical concerns  Toileting assist Assist for toileting: Moderate Assistance - Patient 50 - 74%     Transfers Chair/bed transfer  Transfers assist  Chair/bed transfer activity did not occur: Safety/medical concerns  Chair/bed transfer assist level: Minimal Assistance - Patient > 75%     Locomotion Ambulation   Ambulation assist      Assist level: Moderate Assistance - Patient 50 - 74% Assistive device: Walker-rolling Max distance: 20   Walk 10 feet activity   Assist  Assist level: Moderate Assistance - Patient - 50 - 74% Assistive device: Walker-rolling   Walk 50 feet activity   Assist Walk 50 feet with 2 turns activity did not occur: Safety/medical concerns         Walk 150 feet activity   Assist Walk 150 feet activity did not occur: Safety/medical concerns         Walk 10 feet on uneven surface  activity   Assist Walk 10 feet on uneven surfaces activity did not occur:  Safety/medical concerns         Wheelchair     Assist Will patient use wheelchair at discharge?: Yes Type of Wheelchair: Manual    Wheelchair assist level: Supervision/Verbal cueing Max wheelchair distance: 150'    Wheelchair 50 feet with 2 turns activity    Assist        Assist Level: Supervision/Verbal cueing   Wheelchair 150 feet activity     Assist Wheelchair 150 feet activity did not occur: Safety/medical concerns   Assist Level: Supervision/Verbal cueing      Medical Problem List and Plan: 1.  Deficits with mobility, transfers, endurance, self-care secondary to left BKA.  -Continue CIR therapies including PT, OT  2.  DVT Prophylaxis/Anticoagulation: Pharmaceutical: Heparin 3. Pain Management:   Continue oxycodone as needed with tramadol in between.  Robaxin scheduled on 11/28, increased on 11/29  -reasonable control at present 4. Mood: LCSW to follow for evaluation and support 5. Neuropsych: This patient is capable of making decisions on his own behalf. 6. Skin/Wound Care: Routine pressure relief measures.  Ordered PRAFO for right foot.     -reviewed left stump dressings with patient/nurse  -continue ACE with gradient wrap until blisters better 7. Fluids/Electrolytes/Nutrition: Strict I's and O's.  Carb modified/ renal diet with 1200 cc fluid restriction. 8.  T2DM: Monitor blood sugars AC at bedtime.  Continue Lantus and titrate as indicated for tighter control  Monitor with increased mobility 9.  ESRD: Continue hemodialysis MWF at end of day to help with tolerance of activity.  Metrodin on hold. 10.  Sepsis due to MRSA bacteremia: Vancomycin with hemodialysis through 12/9 - level WNL on 12/1 11.  Anemia of chronic disease: Monitor H&H with hemodialysis for trend.  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 13.  Drug-induced constipation  Bowel  regimen increased on 11/28  BM 12/1 14. Elevated blood pressure  Monitor with increased mobility 15. Leukocytosis  WBCs 11.8 on 11/26  Afebrile  Cont to monitor  LOS: 5 days A FACE TO FACE EVALUATION WAS PERFORMED  Meredith Staggers 11/27/2018, 9:01 AM

## 2018-11-27 NOTE — Progress Notes (Signed)
Occupational Therapy Session Note  Patient Details  Name: [name] Sr. MRN: [id number] Date of Birth: [birth year]  Today's Date: 11/27/2018 OT Individual Time: 6195-0932 OT Individual Time Calculation (min): 55 min   Short Term Goals: Week 1:  OT Short Term Goal 1 (Week 1): LTG=STG 2/2 ELOS  Skilled Therapeutic Interventions/Progress Updates:    Pt greeted seated in wc and agreeable to OT treatment session. Pt has questions regarding dc and discussed OT goals and POC with pt. Pt completed UB bathing from wc level at the sink. Discussed need to sponge bathe upon d/c until pt is able to get up flight of stairs to bedroom and bathroom, pt verbalized understanding. UB strengthening and endurance with wc propulsion to therapy gym without rest breaks.  Worked on sit<>stand, standing balance and standing endurance with connect four activity standing at raised table. Pt tolerated 3 mins standing at longest bout. Pt reached max fatigue after 3 bouts of standing for 2-3 mins each. Reviewed wc parts with pt on how to swing leg rests on and off. Pt propelled wc back to room and left seated in wc with needs met.   Therapy Documentation Precautions:  Precautions Precautions: Fall Precaution Comments: L BKA Required Braces or Orthoses: (No limb protector in room) Restrictions Weight Bearing Restrictions: Yes LLE Weight Bearing: Non weight bearing Pain: Pain Assessment Pain Scale: 0-10 Pain Score: 3  Pain Type: Surgical pain;Acute pain Pain Location: Leg Pain Orientation: Left Pain Descriptors / Indicators: Aching;Throbbing Pain Frequency: Intermittent Pain Onset: On-going Patients Stated Pain Goal: 0 Pain Intervention(s): Rest;Relaxation   Other Treatments:     Therapy/Group: Individual Therapy  Valma Cava 11/27/2018, 10:07 AM

## 2018-11-27 NOTE — Procedures (Signed)
Patient seen on Hemodialysis. QB 450, UF goal 3.5L Tolerating HD without problems.  Elmarie Shiley MD [facility]. Office # [phone] Pager # [phone] 3:03 PM

## 2018-11-27 NOTE — Progress Notes (Signed)
[facility] Progress Note   Subjective:  Seen in room, feeling ok today. No CP/dyspnea. Says R foot wound and L stump healing well.  Objective Vitals:   11/26/18 1434 11/26/18 1936 11/27/18 0607 11/27/18 0608  BP: 139/70 128/65 131/60   Pulse: 77 73 75   Resp: 12 18 18    Temp: 98.4 F (36.9 C) 98.8 F (37.1 C) 98.7 F (37.1 C)   TempSrc:      SpO2: 95% 100% 97%   Weight:    94 kg  Height:       Physical Exam General: Well appearing man, NAD Heart: RRR; 2/6 murmur Lungs: CTAB Extremities: L BKA stump bandaged (C/D/I), R leg without edema Dialysis Access: AVF  Additional Objective Labs: Basic Metabolic Panel: Recent Labs  Lab 11/20/18 1515 11/21/18 0704 11/24/18 1321  NA 137 132* 128*  K 4.6 5.1 4.5  CL  --  97* 91*  CO2  --  25 23  GLUCOSE 109* 255* 115*  BUN  --  36* 67*  CREATININE  --  8.13* 10.51*  CALCIUM  --  8.2* 7.8*  PHOS  --   --  4.4   Liver Function Tests: Recent Labs  Lab 11/24/18 1321  ALBUMIN 2.5*   CBC: Recent Labs  Lab 11/20/18 1515 11/21/18 0704 11/24/18 1321  WBC  --  11.8* 12.3*  HGB 11.9* 10.6* 8.2*  HCT 35.0* 34.8* 27.8*  MCV  --  90.6 93.9  PLT  --  331 410*   Medications: . sodium chloride    . sodium chloride    . vancomycin Stopped (11/24/18 1643)   . atorvastatin  40 mg Oral QHS  . Chlorhexidine Gluconate Cloth  6 each Topical Q0600  . darbepoetin (ARANESP) injection - DIALYSIS  25 mcg Intravenous Q Mon-HD  . doxercalciferol  3 mcg Intravenous Q M,W,F-HD  . feeding supplement (PRO-STAT SUGAR FREE 64)  30 mL Oral BID  . ferric citrate  210 mg Oral TID WC  . heparin  5,000 Units Subcutaneous Q8H  . hydrocerin   Topical BID  . insulin aspart  0-9 Units Subcutaneous TID WC  . insulin aspart protamine- aspart  5 Units Subcutaneous BID WC  . methocarbamol  1,000 mg Oral QID  . multivitamin with minerals  1 tablet Oral Q supper  . polyethylene glycol  17 g Oral Daily  . primidone  50 mg Oral QHS  .  senna-docusate  2 tablet Oral BID  . timolol  1 drop Left Eye BID    Dialysis Orders: [facility] MWF  4h 35min500/800-14 gauge needles 95.5kg 2/2.25 bathP2 AVF Hep none -Hectorol 3 mcg IV TIW -Parsabiv 7.5 mg IV TIW  Assessment/Plan: 1. MRSA bacteremia: D/t chronic bilateral foot wounds (had prior multiple MRSA wound cultures in Sept/Oct) Hx osteomyelitis of L foot. BCx 11/19 +MRSA, repeat BCx 11/22 negative. 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 On IV Vancomycin through 12/04/18. 2. ESRD: Continue HD per usual MWF schedule, for HD today. 3. BP/volume: BP chronically low, on midodrine as outpatient. Will need lower EDW on d/c (s/p amputation). 4. Anemia: Hgb trending down. Typically with ^ Hgb. Aranesp 7mcg weekly starting today. 5. MBD-CKD: CorrCa/Phos ok.Continue Hectorol/Auryxia binder. No Parsabiv here. 6. Nutrition: Alb low, continue supplements. 7. NICM EF 55-60% in 02/2018  8. PAD (Hx L tibial angioplasty/stenting 04/2018):Followed Dr. Scot Dock. On Plavix 9. Type 2 DM 10. R foot wound: No osteo on MRI -- getting wound care and IV abx, as  above.   Veneta Penton, PA-C 11/27/2018, 12:01 PM  [facility] Pager: [phone]

## 2018-11-28 ENCOUNTER — Inpatient Hospital Stay (HOSPITAL_COMMUNITY): Payer: Medicare Other | Admitting: Physical Therapy

## 2018-11-28 ENCOUNTER — Inpatient Hospital Stay (HOSPITAL_COMMUNITY): Payer: Medicare Other | Admitting: Occupational Therapy

## 2018-11-28 LAB — GLUCOSE, CAPILLARY
GLUCOSE-CAPILLARY: 78 mg/dL (ref 70–99)
Glucose-Capillary: 93 mg/dL (ref 70–99)
Glucose-Capillary: 84 mg/dL (ref 70–99)
Glucose-Capillary: 137 mg/dL — ABNORMAL HIGH (ref 70–99)

## 2018-11-28 NOTE — Consult Note (Signed)
Neuropsychological Consultation   Patient:   [name].   DOB:   [birth year]  MR Number:  [id number]  Location:  [facility] [id number] [place] [address] Dept: [facility]: [phone]           Date of Service:   11/27/2018  Start Time:   11 AM End Time:   12 PM  Provider/Observer:  Ilean Skill, Psy.D.       Clinical Neuropsychologist       Billing Code/Service: 35573 4 Units  Chief Complaint:    Deantae Shackleton, Sr. is a 67 year old male with a history of type 2 diabetes, diabetic peripheral neuropathy with chronic bilateral diabetic foot ulcers, end-stage renal disease, hypertension, PVD, osteomyelitis left foot positive for MRSA.  The patient was admitted on 11/17/2018 with malaise, fever and left foot pain due to sepsis from staph bacteremia.  Patient was quite resistant and refused amputation prior times until recently when an MRI of the foot revealed active osteomyelitis with edema.  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 [name].  The patient was recommended to have 14 total days of antibiotic therapy.  The patient has been trying to cope with the loss of his lower left leg and adjusting to some of the realities.  Reason for Service:  The patient was referred for neuropsychological consultation due to adjustment and coping issues following lower left leg amputation.  Below is the HPI for the current admission.  HPI: Jaymon Dudek. Koning, Sr. is a 67 year old male with history of T2DM, diabetic peripheral neuropathy with chronic bilateral diabetic foot ulcers, ESRD, HTN, PVD, osteomyelitis left foot positive for MRSA being treated with vancomycin.  History taken from chart review, patient, and family.  He was admitted on 11/17/2018 with malaise, fevers and left foot pain due to sepsis from staph bacteremia.  Patient has refused amputation multiple times in  the past and MRI of foot done revealing active osteomyelitis with edema.  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 [name].  ID following for input and recommended 14 total days of antibiotic therapy through 12/04/2018 as source of infection has been amputated.  Therapy initiated and patient noted to have functional deficits that was CIR recommended for follow-up therapy.  Current Status:  The patient reports that initially he was having a lot of difficulty coping with and dealing with the fact that he was going to have to have his leg amputated.  He had struggled for some time to try to preserve the foot itself.  However, the patient is trying to adjust to having the amputation and worried about his other foot down the road.  The patient denies any significant symptoms of depression but is having some adjustment and coping issues.  Behavioral Observation: TARRON KROLAK Sr.  presents as a 67 y.o.-year-old Right African American Male who appeared his stated age. his dress was Appropriate and he was Well Groomed and his manners were Appropriate to the situation.  his participation was indicative of Appropriate and Attentive behaviors.  There were any physical disabilities noted.  he displayed an appropriate level of cooperation and motivation.     Interactions:    Active Appropriate and Attentive  Attention:   within normal limits and attention span and concentration were age appropriate  Memory:   within normal limits; recent and remote memory intact  Visuo-spatial:  not  examined  Speech (Volume):  normal  Speech:   normal; normal  Thought Process:  Coherent and Relevant  Though Content:  WNL; not suicidal and not homicidal  Orientation:   person, place, time/date and situation  Judgment:   Good  Planning:   Good  Affect:    Depressed  Mood:    Dysphoric  Insight:   Fair  Intelligence:   normal  Medical History:   Past Medical History:  Diagnosis Date  .  Cardiomyopathy ([place])   . Cataract    right eye  . Diabetes mellitus    type 2  . Diabetic foot ulcer ([place])   . Diabetic infection of right foot ([place])   . Diabetic peripheral neuropathy ([place])   . ESRD (end stage renal disease) ([place])    m-w-f [facility] [address]  . Glaucoma    left eye  . Headache   . HTN (hypertension)   . Osteomyelitis ([place])   . Peripheral vascular disease ([place])    critical limb ischemia  Psychiatric History:  The patient denies any significant prior psychiatric illness.  The patient denies any significant symptoms of depression or anxiety today although he is having some adjustment coping issues due to the amputation of his foot.  The patient reports that he is beginning to adapt to this realization and adjusting as each day goes by.  Family Med/Psych History:  Family History  Problem Relation Age of Onset  . Heart disease Mother   . Diabetes Father   . Diabetes Brother   . Diabetes Sister   . Diabetes Brother   . Diabetes Brother     Risk of Suicide/Violence: low the patient denies any suicidal or homicidal ideation.  Impression/DX:  Hardie Pulley, Sr. is a 67 year old male with a history of type 2 diabetes, diabetic peripheral neuropathy with chronic bilateral diabetic foot ulcers, end-stage renal disease, hypertension, PVD, osteomyelitis left foot positive for MRSA.  The patient was admitted on 11/17/2018 with malaise, fever and left foot pain due to sepsis from staph bacteremia.  Patient was quite resistant and refused amputation prior times until recently when an MRI of the foot revealed active osteomyelitis with edema.  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 [name].  The patient was recommended to have 14 total days of antibiotic therapy.  The patient has been trying to cope with the loss of his lower left leg and adjusting to some of the realities.  The patient reports that initially he was having a lot of difficulty  coping with and dealing with the fact that he was going to have to have his leg amputated.  He had struggled for some time to try to preserve the foot itself.  However, the patient is trying to adjust to having the amputation and worried about his other foot down the road.  The patient denies any significant symptoms of depression but is having some adjustment and coping issues.    Disposition/Plan:  I will follow-up with the patient next week if needed.  However, I do expect him to begin to adjust better and we did work on adjustment and coping issues today during the session.          Electronically Signed   _______________________ Ilean Skill, Psy.D.

## 2018-11-28 NOTE — Progress Notes (Addendum)
Physical Therapy Session Note  Patient Details  Name: [name] PEDLEY Sr. MRN: [id number] Date of Birth: 1951-11-14  Today's Date: 11/28/2018 PT Individual Time: 0915-1010 and 0601-5615 PT Individual Time Calculation (min): 55 min and 23 min   Short Term Goals: Week 1:  PT Short Term Goal 1 (Week 1): = LTG  Skilled Therapeutic Interventions/Progress Updates:    pt performs squat pivot transfers with supervision throughout session.  W/c mobility and parts management with supervision.  Gait with RW 3 x 10' with CGA for gait into bathroom at home.  Side stepping with RW as pt states he will have to side step in bathroom. Pt performs with CGA.  Supine, sidelying and prone therex all 2 x 12 for bilat hip and knee strengthening. Pt states he still has not found a way to have a ramp installed and states he has 2 stairs to enter, his wife is to bring photos of his entry.  Pt left in recliner with needs at hand.  Session 2: Pt with no c/o pain.  Pt performs supine abdominal strengthening with trunk rotations, diagonals, mini crunches, bilat SLR for lower abdominals.  Pt left in bed with needs at hand, RN present.  Therapy Documentation Precautions:  Precautions Precautions: Fall Precaution Comments: L BKA Required Braces or Orthoses: (No limb protector in room) Restrictions Weight Bearing Restrictions: Yes LLE Weight Bearing: Non weight bearing Pain: Pain Assessment Pain Scale: 0-10 Pain Score: 0-No pain    Therapy/Group: Individual Therapy  DONAWERTH,KAREN 11/28/2018, 10:13 AM

## 2018-11-28 NOTE — Progress Notes (Signed)
  [facility] Progress Note   Subjective:  Seen in room, continues to improve. No CP, dyspnea. Dialysis went fine yesterday except developed leg pain at end of treatment (pain meds dialyzed out likely).  Objective Vitals:   11/27/18 1736 11/27/18 1938 11/28/18 0452 11/28/18 0539  BP: 138/65 125/67 (!) 141/77   Pulse: 87 85 79   Resp: 16 18 20    Temp: 98.5 F (36.9 C) 98.2 F (36.8 C) 98.6 F (37 C)   TempSrc: Oral  Oral   SpO2: 97% 95% 99%   Weight: 91.6 kg   92 kg  Height:       Physical Exam General: Well appearing man, NAD Heart: RRR; 2/6 murmur Lungs: CTAB Extremities: L BKA stump bandaged (C/D/I), R leg without edema Dialysis Access: AVF  Additional Objective Labs: Basic Metabolic Panel: Recent Labs  Lab 11/24/18 1321 11/27/18 1355  NA 128* 129*  K 4.5 4.4  CL 91* 90*  CO2 23 23  GLUCOSE 115* 95  BUN 67* 61*  CREATININE 10.51* 11.05*  CALCIUM 7.8* 7.9*  PHOS 4.4 4.0   Liver Function Tests: Recent Labs  Lab 11/24/18 1321 11/27/18 1355  ALBUMIN 2.5* 2.5*   CBC: Recent Labs  Lab 11/24/18 1321 11/27/18 1354  WBC 12.3* 10.7*  HGB 8.2* 8.2*  HCT 27.8* 26.4*  MCV 93.9 90.7  PLT 410* 456*   Iron Studies:  Recent Labs    11/27/18 1354  IRON 35*  TIBC 172*  FERRITIN 833*   Medications: . vancomycin Stopped (11/27/18 1834)   . atorvastatin  40 mg Oral QHS  . Chlorhexidine Gluconate Cloth  6 each Topical Q0600  . darbepoetin (ARANESP) injection - DIALYSIS  25 mcg Intravenous Q Mon-HD  . doxercalciferol  3 mcg Intravenous Q M,W,F-HD  . feeding supplement (PRO-STAT SUGAR FREE 64)  30 mL Oral BID  . ferric citrate  210 mg Oral TID WC  . heparin  5,000 Units Subcutaneous Q8H  . hydrocerin   Topical BID  . insulin aspart  0-9 Units Subcutaneous TID WC  . insulin aspart protamine- aspart  5 Units Subcutaneous BID WC  . methocarbamol  1,000 mg Oral QID  . multivitamin with minerals  1 tablet Oral Q supper  . polyethylene glycol  17  g Oral Daily  . primidone  50 mg Oral QHS  . senna-docusate  2 tablet Oral BID  . timolol  1 drop Left Eye BID    Dialysis Orders: [facility] MWF  4h 58min500/800-14 gauge needles95.5kg 2/2.25 bathP2 AVF Hep none -Hectorol 3 mcg IV TIW -Parsabiv 7.5 mg IV TIW  Assessment/Plan: 1. MRSA bacteremia: D/t chronic bilateral foot wounds (had prior multiple MRSA wound cultures in Sept/Oct) Hx osteomyelitis of L foot. BCx 11/19 +MRSA, repeat BCx 11/22 negative. 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 On IV Vancomycin through 12/04/18. 2. ESRD: Continue HD per usual MWF schedule, next 12/4. 3. BP/volume: BP chronically low, on midodrine as outpatient. Will need lower EDW on d/c (s/p amputation). 4. Anemia: Hgb 8.2Typically with ^ Hgb. Aranesp 70mcg weekly started 12/2. 5. MBD-CKD: CorrCa/Phos ok.Continue Hectorol/Auryxia binder. No Parsabiv here. 6. Nutrition: Alb low, continue supplements. 7. NICM EF 55-60% in 02/2018  8. PAD (Hx L tibial angioplasty/stenting 04/2018):Followed Dr. Scot Dock. On Plavix 9. Type 2 DM 10. R foot wound: No osteo on MRI -- getting wound care and IV abx, as above.   Veneta Penton, PA-C 11/28/2018, 10:09 AM  [facility] Pager: [phone]

## 2018-11-28 NOTE — Progress Notes (Signed)
Occupational Therapy Session Note  Patient Details  Name: [name] VERSTRAETE Sr. MRN: [id number] Date of Birth: 1951/09/06  Today's Date: 11/28/2018 OT Individual Time: 1130-1200 OT Individual Time Calculation (min): 30 min    Short Term Goals: Week 1:  OT Short Term Goal 1 (Week 1): LTG=STG 2/2 ELOS  Skilled Therapeutic Interventions/Progress Updates:    Pt seen this session to practice bathroom transfers and prep supplies and set up for a shower later today.  Pt stated he will be using a tub bench at home so a bench was obtained for his room.  Demonstrated to pt how the L limb and and R arm with IV can be covered.  RN stated R foot can be left uncovered for the shower.  Pt worked on sit to stand from Psychologist, occupational to [employer] with min A and then ambulated to bathroom to transfer to shower bench.  Because he is avoiding putting wt on the ball of the foot he puts extra force through heel.  Each "hop" sounds as though he is putting extra force on his R foot. Recommended he only use RW to transfer from BR door into shower versus all the way from his bed. Pt stood with bar from bench with min A.  Coming out of bathroom, pt ambulated to doorway and then used W.c to move to bed.  Stand pivot to bed with S.  Pt worked on L knee AROM with isometric holds.  Pt resting in bed with alarm set and all needs met.  Therapy Documentation Precautions:  Precautions Precautions: Fall Precaution Comments: L BKA Required Braces or Orthoses: (No limb protector in room) Restrictions Weight Bearing Restrictions: Yes LLE Weight Bearing: Non weight bearing    Vital Signs: Therapy Vitals Temp: 98.6 F (37 C) Temp Source: Oral Pulse Rate: 79 Resp: 20 BP: (!) 141/77 Patient Position (if appropriate): Sitting Oxygen Therapy SpO2: 99 % O2 Device: Room Air Pain: Pain Assessment Pain Scale: 0-10 Pain Score: 3  Pain Type: Surgical pain Pain Location: Leg Pain Orientation: Left Pain Descriptors / Indicators:  Aching Pain Frequency: Constant Pain Onset: On-going Patients Stated Pain Goal: 0 Pain Intervention(s): Medication (See eMAR)        Therapy/Group: Individual Therapy  [place] 11/28/2018, 8:24 AM

## 2018-11-28 NOTE — Progress Notes (Signed)
Foam dressing removed from left upper knee area, pt states he had a blister there that popped on Friday or Saturday, unsure of the date, states he told the nurse but she said we have documented blisters, however, the blisters that had been documented previously were only the blisters on the distal end of the left stump.  The  blisters that are on the stump which have grown bigger and are now round and swollen areas that appear to be filled with fluid, 2 areas are about 3 cm long and 1cm wide, one area is 0.5 cm in diameter.  Wound to left upper knee area 4cm wide, 3 cm long, beefy red, per Dr Naaman Plummer apply foam dressing and do not remove it. Leave it in place.

## 2018-11-28 NOTE — Progress Notes (Signed)
Occupational Therapy Session Note  Patient Details  Name: [name] Sr. MRN: [id number] Date of Birth: 01/06/1951  Today's Date: 11/28/2018 OT Individual Time: 1240-1335 OT Individual Time Calculation (min): 55 min  and Today's Date: 11/28/2018 OT Missed Time: 20 Minutes Missed Time Reason: Nursing care  Short Term Goals: Week 1:  OT Short Term Goal 1 (Week 1): LTG=STG 2/2 ELOS  Skilled Therapeutic Interventions/Progress Updates:    Pt greeted semi-reclined in bed and agreeable to OT treatment session. Pts L residual limb covered for shower as well as R UE IV. Squat-pivot bed>w/c>tub bench with CGA. Educated pt on lateral leans to doff pants and wash buttocks. Pt demonstrated understanding. Bathing completed with overall supervision/CGA. Squat-pivot out of shower in similar fashion. Dressing completed wc level at the sink with overall min A for LB dressing. Difficulty with lateral leans in wc to successfully pull up pants requiring min A from OT. Pt transferred back to bed at end of session for nursing to re-dress L residual limb as pt has blisters on the bottom of stump.  Therapy Documentation Precautions:  Precautions Precautions: Fall Precaution Comments: L BKA Required Braces or Orthoses: (No limb protector in room) Restrictions Weight Bearing Restrictions: Yes LLE Weight Bearing: Non weight bearing General: General OT Amount of Missed Time: 20 Minutes Pain: Pain Assessment Pain Score: 0-No pain  Therapy/Group: Individual Therapy  Valma Cava 11/28/2018, 2:55 PM

## 2018-11-28 NOTE — Progress Notes (Signed)
[facility] PHYSICAL MEDICINE & REHABILITATION PROGRESS NOTE  Subjective/Complaints: Having left stump pain at times but medication is working to control. Has questions about discharge, equipment, home, etc.   ROS: Patient denies fever, rash, sore throat, blurred vision, nausea, vomiting, diarrhea, cough, shortness of breath or chest pain, joint or back pain, headache, or mood change.    Objective: Vital Signs: Blood pressure (!) 141/77, pulse 79, temperature 98.6 F (37 C), temperature source Oral, resp. rate 20, height 5\' 11"  (1.803 m), weight 92 kg, SpO2 99 %. No results found. Recent Labs    11/27/18 1354  WBC 10.7*  HGB 8.2*  HCT 26.4*  PLT 456*   Recent Labs    11/27/18 1355  NA 129*  K 4.4  CL 90*  CO2 23  GLUCOSE 95  BUN 61*  CREATININE 11.05*  CALCIUM 7.9*    Physical Exam: BP (!) 141/77 (BP Location: Right Leg)   Pulse 79   Temp 98.6 F (37 C) (Oral)   Resp 20   Ht 5\' 11"  (1.803 m)   Wt 92 kg   SpO2 99%   BMI 28.29 kg/m  Constitutional: No distress . Vital signs reviewed. HEENT: EOMI, oral membranes moist Neck: supple Cardiovascular: RRR without murmur. No JVD    Respiratory: CTA Bilaterally without wheezes or rales. Normal effort    GI: BS +, non-tender, non-distended  Musculoskeletal:  Left stump edema improved with better ACE wrap Right lower extremity amputation of digits  Neurological: He is alert and oriented Motor: RUE: 5/5 proximal to distal RLE: 4+/5 proximal to distal LUE: 4/5 proximal to distal (baseline), stable LUE tremor LLE: HF 4/5 (pain inhibition), KE 3/5 Sensation diminished to light touch left hand  (baseline) Skin:  Left stump with incision c/d/i, 2-3 blisters remain on posterior flap Open abrasion over left patella, all stable Right plantar foot ulcer  with dressing C/D/I--no change Psychiatric: pleasant  Assessment/Plan: 1. Functional deficits secondary to left BKA as well as right foot digit amputations which require  3+ hours per day of interdisciplinary therapy in a comprehensive inpatient rehab setting.  Physiatrist is providing close team supervision and 24 hour management of active medical problems listed below.  Physiatrist and rehab team continue to assess barriers to discharge/monitor patient progress toward functional and medical goals  Care Tool:  Bathing    Body parts bathed by patient: Right arm, Left arm     Body parts n/a: Left lower leg   Bathing assist Assist Level: Minimal Assistance - Patient > 75%     Upper Body Dressing/Undressing Upper body dressing Upper body dressing/undressing activity did not occur (including orthotics): N/A What is the patient wearing?: Pull over shirt    Upper body assist Assist Level: Set up assist    Lower Body Dressing/Undressing Lower body dressing      What is the patient wearing?: Pants, Underwear/pull up     Lower body assist Assist for lower body dressing: Minimal Assistance - Patient > 75%     Toileting Toileting Toileting Activity did not occur (Clothing management and hygiene only): N/A (no void or bm)  Toileting assist Assist for toileting: Moderate Assistance - Patient 50 - 74%     Transfers Chair/bed transfer  Transfers assist  Chair/bed transfer activity did not occur: Safety/medical concerns  Chair/bed transfer assist level: Minimal Assistance - Patient > 75%     Locomotion Ambulation   Ambulation assist      Assist level: Moderate Assistance - Patient 50 -  74% Assistive device: Walker-rolling Max distance: 20   Walk 10 feet activity   Assist     Assist level: Moderate Assistance - Patient - 50 - 74% Assistive device: Walker-rolling   Walk 50 feet activity   Assist Walk 50 feet with 2 turns activity did not occur: Safety/medical concerns         Walk 150 feet activity   Assist Walk 150 feet activity did not occur: Safety/medical concerns         Walk 10 feet on uneven surface   activity   Assist Walk 10 feet on uneven surfaces activity did not occur: Safety/medical concerns         Wheelchair     Assist Will patient use wheelchair at discharge?: Yes Type of Wheelchair: Manual    Wheelchair assist level: Supervision/Verbal cueing Max wheelchair distance: 150'    Wheelchair 50 feet with 2 turns activity    Assist        Assist Level: Supervision/Verbal cueing   Wheelchair 150 feet activity     Assist Wheelchair 150 feet activity did not occur: Safety/medical concerns   Assist Level: Supervision/Verbal cueing      Medical Problem List and Plan: 1.  Deficits with mobility, transfers, endurance, self-care secondary to left BKA.  -Interdisciplinary Team Conference today     -discussed prosthetic process with patient.  2.  DVT Prophylaxis/Anticoagulation: Pharmaceutical: Heparin 3. Pain Management:   Continue oxycodone as needed with tramadol in between.  Robaxin scheduled on 11/28, increased on 11/29  -reasonable control at present 4. Mood: LCSW to follow for evaluation and support 5. Neuropsych: This patient is capable of making decisions on his own behalf. 6. Skin/Wound Care: Routine pressure relief measures.  Ordered PRAFO for right foot.     -reviewed left stump dressings with patient/nurse  -continue ACE with gradient wrap until blisters better 7. Fluids/Electrolytes/Nutrition: Strict I's and O's.  Carb modified/ renal diet with 1200 cc fluid restriction. 8.  T2DM: Monitor blood sugars AC at bedtime.  Continue Lantus and titrate as indicated for tighter control  Monitor with increased mobility 9.  ESRD: Continue hemodialysis MWF at end of day to help with tolerance of activity.  Metrodin on hold. 10.  Sepsis due to MRSA bacteremia: Vancomycin with hemodialysis through 12/9 - level WNL on 12/1 11.  Anemia of chronic disease: Monitor H&H with hemodialysis for trend.  Labs with HD  12  Diabetic peripheral neuropathy with  right plantar neuropathic ulcer: Monitor for now and educated on pressure relief measures. 13.  Drug-induced constipation  Bowel regimen increased on 11/28  BM 12/1 14. Elevated blood pressure  Controlled 12/3 15. Leukocytosis  WBCs 11.8 on 11/26--->10/7 12/2  Afebrile     LOS: 6 days A FACE TO FACE EVALUATION WAS PERFORMED  Meredith Staggers 11/28/2018, 9:04 AM

## 2018-11-29 ENCOUNTER — Inpatient Hospital Stay (HOSPITAL_COMMUNITY): Payer: Medicare Other

## 2018-11-29 LAB — RENAL FUNCTION PANEL
ANION GAP: 16 — AB (ref 5–15)
Albumin: 2.6 g/dL — ABNORMAL LOW (ref 3.5–5.0)
BUN: 56 mg/dL — ABNORMAL HIGH (ref 8–23)
CO2: 26 mmol/L (ref 22–32)
Calcium: 8.5 mg/dL — ABNORMAL LOW (ref 8.9–10.3)
Chloride: 90 mmol/L — ABNORMAL LOW (ref 98–111)
Creatinine, Ser: 9.36 mg/dL — ABNORMAL HIGH (ref 0.61–1.24)
GFR calc Af Amer: 6 mL/min — ABNORMAL LOW (ref >60)
GFR calc non Af Amer: 5 mL/min — ABNORMAL LOW (ref >60)
Glucose, Bld: 105 mg/dL — ABNORMAL HIGH (ref 70–99)
POTASSIUM: 4.7 mmol/L (ref 3.5–5.1)
Phosphorus: 3.5 mg/dL (ref 2.5–4.6)
Sodium: 132 mmol/L — ABNORMAL LOW (ref 135–145)

## 2018-11-29 LAB — CBC
HCT: 27.9 % — ABNORMAL LOW (ref 39.0–52.0)
Hemoglobin: 8.4 g/dL — ABNORMAL LOW (ref 13.0–17.0)
MCH: 27.6 pg (ref 26.0–34.0)
MCHC: 30.1 g/dL (ref 30.0–36.0)
MCV: 91.8 fL (ref 80.0–100.0)
Platelets: 437 10*3/uL — ABNORMAL HIGH (ref 150–400)
RBC: 3.04 MIL/uL — ABNORMAL LOW (ref 4.22–5.81)
RDW: 17.1 % — ABNORMAL HIGH (ref 11.5–15.5)
WBC: 10.3 10*3/uL (ref 4.0–10.5)
nRBC: 0 % (ref 0.0–0.2)

## 2018-11-29 LAB — GLUCOSE, CAPILLARY
Glucose-Capillary: 131 mg/dL — ABNORMAL HIGH (ref 70–99)
Glucose-Capillary: 163 mg/dL — ABNORMAL HIGH (ref 70–99)
Glucose-Capillary: 87 mg/dL (ref 70–99)

## 2018-11-29 MED ORDER — DOXERCALCIFEROL 4 MCG/2ML IV SOLN
INTRAVENOUS | Status: AC
  Administered 2018-11-29: 3 ug via INTRAVENOUS
  Filled 2018-11-29: qty 2

## 2018-11-29 MED ORDER — VANCOMYCIN HCL IN DEXTROSE 1-5 GM/200ML-% IV SOLN
INTRAVENOUS | Status: AC
  Administered 2018-11-29: 1000 mg via INTRAVENOUS
  Filled 2018-11-29: qty 200

## 2018-11-29 MED ORDER — ACETAMINOPHEN 325 MG PO TABS
ORAL_TABLET | ORAL | Status: AC
  Filled 2018-11-29: qty 2

## 2018-11-29 NOTE — Progress Notes (Signed)
Occupational Therapy Session Note  Patient Details  Name: [name] SLOWEY Sr. MRN: [id number] Date of Birth: 10/02/1951  Today's Date: 11/29/2018 OT Individual Time: 7290-2111 OT Individual Time Calculation (min): 40 min    Short Term Goals: Week 1:  OT Short Term Goal 1 (Week 1): LTG=STG 2/2 ELOS  Skilled Therapeutic Interventions/Progress Updates:  Pt received sitting up in w/c with no c/o pain. Utilized motivational interviewing techniques to discuss self-identity, adjustment to L BKA, and self-efficacy. Provided edu re importance of social support and engagement in IADLs/lesiure activities of choice. Pt engaged in conversation throughout and thankful for education. Pt propelled w/c down to therapy gym and completed stand pivot to mat with min guard. Pt stood with RW and release unilateral UE to hold weight and complete forward functional reaching. Min A provided for balance throughout- with increased in postural sway when releasing L UE from RW. Cueing provided for knee and hip extension in standing and in sitting. Pt passed off to OT in gym.   Therapy Documentation Precautions:  Precautions Precautions: Fall Precaution Comments: L BKA Required Braces or Orthoses: (No limb protector in room) Restrictions Weight Bearing Restrictions: Yes LLE Weight Bearing: Non weight bearing    Pain: Pain Assessment Pain Scale: 0-10 Pain Score: 0-No pain Faces Pain Scale: No hurt   Therapy/Group: Individual Therapy  Curtis Sites 11/29/2018, 11:30 AM

## 2018-11-29 NOTE — Progress Notes (Signed)
Social Work Patient ID: [name] Sages Sr., male   DOB: Jun 06, 1951, 67 y.o.   MRN: [id number]   CSW met with pt 11-28-18 to update him on team conference discussion and to give him targeted d/c date of 12-02-18.  Pt will not be able to have ramp built by then is not sure he can afford a ramp.  CSW and PT are working with pt on options and CSW is trying to look into community resources, although it is unlikely that something can happen by this Saturday.  CSW looking into portable or premade ramps and how much these would be and investigating funding for pt.  CSW will continue to work on this and other d/c plans for pt.  Therapists would like for family to come in for family education prior to pt's d/c.  CSW will work on this, as well.

## 2018-11-29 NOTE — Progress Notes (Signed)
  [facility] Progress Note   Subjective:  Only concern is that he may not have anyone be able to stay at home with him for a few weeks, told to disc with rehab providers. Denies CP, dyspnea. Leg pain ok.   Objective Vitals:   11/28/18 0539 11/28/18 1452 11/28/18 1954 11/29/18 0325  BP:  (!) 135/57 (!) 124/54 140/71  Pulse:  75 78 82  Resp:  18 18 18   Temp:  98.4 F (36.9 C) 98.3 F (36.8 C) 98 F (36.7 C)  TempSrc:   Oral Oral  SpO2:  93% 99% 98%  Weight: 92 kg   92.2 kg  Height:       Physical Exam General:Well appearing man, NAD Heart:RRR; 2/6 murmur Lungs:CTAB Extremities:L BKA stump bandaged (C/D/I), R leg without edema Dialysis Access:AVF  Additional Objective Labs: Basic Metabolic Panel: Recent Labs  Lab 11/24/18 1321 11/27/18 1355  NA 128* 129*  K 4.5 4.4  CL 91* 90*  CO2 23 23  GLUCOSE 115* 95  BUN 67* 61*  CREATININE 10.51* 11.05*  CALCIUM 7.8* 7.9*  PHOS 4.4 4.0   Liver Function Tests: Recent Labs  Lab 11/24/18 1321 11/27/18 1355  ALBUMIN 2.5* 2.5*   CBC: Recent Labs  Lab 11/24/18 1321 11/27/18 1354  WBC 12.3* 10.7*  HGB 8.2* 8.2*  HCT 27.8* 26.4*  MCV 93.9 90.7  PLT 410* 456*   Iron Studies:  Recent Labs    11/27/18 1354  IRON 35*  TIBC 172*  FERRITIN 833*   Medications: . vancomycin Stopped (11/27/18 1834)   . atorvastatin  40 mg Oral QHS  . Chlorhexidine Gluconate Cloth  6 each Topical Q0600  . darbepoetin (ARANESP) injection - DIALYSIS  25 mcg Intravenous Q Mon-HD  . doxercalciferol  3 mcg Intravenous Q M,W,F-HD  . feeding supplement (PRO-STAT SUGAR FREE 64)  30 mL Oral BID  . ferric citrate  210 mg Oral TID WC  . heparin  5,000 Units Subcutaneous Q8H  . hydrocerin   Topical BID  . insulin aspart  0-9 Units Subcutaneous TID WC  . insulin aspart protamine- aspart  5 Units Subcutaneous BID WC  . methocarbamol  1,000 mg Oral QID  . multivitamin with minerals  1 tablet Oral Q supper  . polyethylene  glycol  17 g Oral Daily  . primidone  50 mg Oral QHS  . senna-docusate  2 tablet Oral BID  . timolol  1 drop Left Eye BID    Dialysis Orders: [facility] MWF  4h 73min500/800-14 gauge needles95.5kg 2/2.25 bathP2 AVF Hep none -Hectorol 3 mcg IV TIW -Parsabiv 7.5 mg IV TIW  Assessment/Plan: 1. MRSA bacteremia: D/tchronic bilateral foot wounds (had prior multiple MRSA wound cultures in Sept/Oct) Hx osteomyelitis of L foot. BCx11/19 +MRSA, repeat BCx 11/22 negative. 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 On IV Vancomycin through12/9/19. 2. ESRD: Continue HD per usual MWF schedule, next later today. 3. BP/volume: BP chronically low, on midodrine as outpatient. Will need lower EDW on d/c (s/p amputation). 4. Anemia: Hgb 8.2. Typically with ^ Hgb. Aranesp 81mcg weekly started 12/2. 5. MBD-CKD: CorrCa/Phos ok.Continue Hectorol/Auryxia binder. No Parsabiv here. 6. Nutrition: Alb low, continue supplements. 7. NICM EF 55-60% in 02/2018  8. PAD(HxL tibial angioplasty/stenting 04/2018):Followed Dr. Scot Dock. On Plavix 9. Type 2 DM 10. Rfoot wound: No osteo on MRI -- getting wound care and IV abx, as above.  Veneta Penton, PA-C 11/29/2018, 9:40 AM  [employer] Pager: [phone]

## 2018-11-29 NOTE — Plan of Care (Signed)
  Problem: Consults Goal: RH LIMB LOSS PATIENT EDUCATION Description Description: See Patient Education module for eduction specifics. Outcome: Progressing Goal: Skin Care Protocol Initiated - if Braden Score 18 or less Description If consults are not indicated, leave blank or document N/A Outcome: Progressing Goal: Nutrition Consult-if indicated Outcome: Progressing   Problem: RH BOWEL ELIMINATION Goal: RH STG MANAGE BOWEL WITH ASSISTANCE Description STG Manage Bowel with min  Assistance.  Outcome: Progressing   Problem: RH SKIN INTEGRITY Goal: RH STG SKIN FREE OF INFECTION/BREAKDOWN Description Patient will have infection managed with antibiotics at discharge.  Outcome: Progressing Goal: RH STG MAINTAIN SKIN INTEGRITY WITH ASSISTANCE Description STG Maintain Skin Integrity With min Assistance.  Outcome: Progressing Goal: RH STG ABLE TO PERFORM INCISION/WOUND CARE W/ASSISTANCE Description STG Able To Perform Incision/Wound Care With mod Assistance.  Outcome: Progressing   Problem: RH SAFETY Goal: RH STG ADHERE TO SAFETY PRECAUTIONS W/ASSISTANCE/DEVICE Description STG Adhere to Safety Precautions With  Min Assistance/Device.  Outcome: Progressing   Problem: RH PAIN MANAGEMENT Goal: RH STG PAIN MANAGED AT OR BELOW PT'S PAIN GOAL Description Less than 4 on 0-10 scale  Outcome: Progressing   Problem: RH KNOWLEDGE DEFICIT LIMB LOSS Goal: RH STG INCREASE KNOWLEDGE OF SELF CARE AFTER LIMB LOSS Description Patient will be able to verbalize how to manage care for self at discharge.  Outcome: Progressing   Problem: Consults Goal: RH LIMB LOSS PATIENT EDUCATION Description Description: See Patient Education module for eduction specifics. Outcome: Progressing Goal: Skin Care Protocol Initiated - if Braden Score 18 or less Description If consults are not indicated, leave blank or document N/A Outcome: Progressing Goal: Nutrition Consult-if indicated Outcome:  Progressing   Problem: RH BOWEL ELIMINATION Goal: RH STG MANAGE BOWEL WITH ASSISTANCE Description STG Manage Bowel with min  Assistance.  Outcome: Progressing   Problem: RH SKIN INTEGRITY Goal: RH STG SKIN FREE OF INFECTION/BREAKDOWN Description Patient will have infection managed with antibiotics at discharge.  Outcome: Progressing Goal: RH STG MAINTAIN SKIN INTEGRITY WITH ASSISTANCE Description STG Maintain Skin Integrity With min Assistance.  Outcome: Progressing Goal: RH STG ABLE TO PERFORM INCISION/WOUND CARE W/ASSISTANCE Description STG Able To Perform Incision/Wound Care With mod Assistance.  Outcome: Progressing   Problem: RH SAFETY Goal: RH STG ADHERE TO SAFETY PRECAUTIONS W/ASSISTANCE/DEVICE Description STG Adhere to Safety Precautions With  Min Assistance/Device.  Outcome: Progressing   Problem: RH PAIN MANAGEMENT Goal: RH STG PAIN MANAGED AT OR BELOW PT'S PAIN GOAL Description Less than 4 on 0-10 scale  Outcome: Progressing   Problem: RH KNOWLEDGE DEFICIT LIMB LOSS Goal: RH STG INCREASE KNOWLEDGE OF SELF CARE AFTER LIMB LOSS Description Patient will be able to verbalize how to manage care for self at discharge.  Outcome: Progressing

## 2018-11-29 NOTE — Progress Notes (Signed)
Occupational Therapy Session Note  Patient Details  Name: [name]. MRN: [id number] Date of Birth: Nov 13, 1951  Today's Date: 11/29/2018 OT Individual Time: 0800-0830 OT Individual Time Calculation (min): 30 min   And [phone] 60 min   Short Term Goals: Week 1:  OT Short Term Goal 1 (Week 1): LTG=STG 2/2 ELOS  Skilled Therapeutic Interventions/Progress Updates:    Session1: (30 min)1;1. Pt dons pants EOB with set up leaning laterally for clothing management. Pt transfers into w/c via lat scoot with supervision. Pt grooms at sink with set up. Pt discusses that wife/daughter might not be able to provide 24/7 supervision at d/c. Discuss with pt use of BSC outside of bathroom for safety when home alone using lateral leans/squat pivot transfer to [facility] from w/c. This may decrease risk of falling as pt would have to ambualte into bathroom with RW since w/c will not fit into bathroom door. Exited session with NT present in room changing sheets and pt seated in w/c waiting for next session  Session 2 (60 min): direct hand off in tx gym from previous OT. Pt completes 3x10 "punches" slow and controlled to OT hands as targets as if boxing while in long sitting for knee extension/core strengthening and UE endurance. Pt propels w/c back to room with supervision. OT sets up [facility] against wall and pt set up w.c for transfer with 1 VC for hand placement. Pt does not require VC on transfer back to w/c. To simulate toileting at home on [facility] in living room if alone, pt educated on managing clothing using lateral leans and is able to execute simulated toileting with supervision level. Pt would benefit from continued practice for speed and efficiency. Pt educated on use of urinal when at home alone to decrease need to transfer while alone. Pt verbalizes understanding. Pt washes at sink with set up for UB bathing and dressing. Pt elects to change LB clothing after session. Pt completes w/c propulsion to/from gift  shop with 1 rest break for community level distances. Pt manuevers around tight spaces in between displays in gift shop with VC for removing LE rest to improve steering with LE. Pt albe to find way back to room with no VC. Exited session with pt seated in bed, call ligh tin reach and exit alarm on  Therapy Documentation Precautions:  Precautions Precautions: Fall Precaution Comments: L BKA Required Braces or Orthoses: (No limb protector in room) Restrictions Weight Bearing Restrictions: (P) Yes LLE Weight Bearing: Non weight bearing General:   Vital Signs:   Pain: Pain Assessment Pain Scale: 0-10 Pain Score: 0-No pain Faces Pain Scale: No hurt ADL:   Vision   Perception    Praxis   Exercises:   Other Treatments:     Therapy/Group: Individual Therapy  Tonny Branch 11/29/2018, 8:21 AM

## 2018-11-29 NOTE — Consult Note (Signed)
   [facility] CM Inpatient Consult   11/29/2018  ONIX JUMPER Sr. Oct 07, 1951 [id number]   Received call from inpatient rehab LCSW Sonia Baller) for [place] Management services. Specifically assistance for community resources for a ramp. Discussed that patient is eligible for [facility] Care Management due to being a patient of [facility].  Currently Mr. Granquist is off the unit in HD.  Discussed that writer will follow up at later time to discuss [place] [facility] follow up.   Marthenia Rolling, MSN-Ed, RN,BSN [facility] Liaison [phone]

## 2018-11-29 NOTE — Procedures (Signed)
Patient seen on Hemodialysis. QB 450, UF goal 3L No complaints.   Elmarie Shiley MD [facility]. Office # [phone] Pager # [phone] 1:28 PM

## 2018-11-29 NOTE — Progress Notes (Signed)
Physical Therapy Session Note  Patient Details  Name: [name] HARR Sr. MRN: [id number] Date of Birth: 13-Mar-1951  Today's Date: 11/29/2018 PT Individual Time: 1045-1205 PT Individual Time Calculation (min): 80 min   Short Term Goals: Week 1:  PT Short Term Goal 1 (Week 1): = LTG     Skilled Therapeutic Interventions/Progress Updates:   Pt resting in bed.  He sat up EOB with supervision.  Pt reported that he will not have a ramp by the time he discharges.  He has a photo of the entrance of his home; 2 steps to a small landing, a step up to door, then a threshold.  No rails.  He is awaiting a total height measurement of these steps from his neighbor.  Pt does not have an order for shrinker sock; he stated that he has 2 blisters on residual limb, one superior to patella which drains slightly and one, closed at end of residual limb.  ACE wrap appropriatly snug and intact on residual limb.  Stand pivot bed> w/c to L with RW, min guard assist.  W/c propulsion wearing gloves, with supervision on unit.  Supine exercises 15 x 1: modified abdominal crunch, R unilateral bridges; R/L side lying 15 x 1 for L/R hip abdcution with flexed knees and hips.  Prone exercises 15 x 1 L isolated hip extension with flexed knee, L terminal knee extension with isometric hold at end range, 2 x 15 R ankle pumps with foot hanging off of edge of mat.  PT demonstrated use of bil UEs on RW to elevate upper body in order to clear R foot.  Pt with difficulty doing this forward /backward x 1 step each.  Up/down (2) 3" high steps bil rails, backwards, with mod asssit to clear foot.  Pt realized that a ramp is a necessity; he stated that he is exhausted when he returns from HD 3x/week and really would have difficulty entering the 4 steps at his house. PT and pt discussed possibility of his family bumping him up the steps in a w/c to enter house.  This is not feasible as it would require 2 persons, and his wife works daily.    PT and pt consulted with Sonia Baller, [place] regarding need for ramp.  She needs the total ht measurement before she can pursue charitible options.  Pt is going to call his neighbor again and request measurement.  Pt left resting in w/c with needs at hand, set up for lunch.      Therapy Documentation Precautions:  Precautions Precautions: Fall Precaution Comments: L BKA Required Braces or Orthoses: (No limb protector in room) Restrictions Weight Bearing Restrictions: No LLE Weight Bearing: Non weight bearing   Pain: Pain Assessment Pain Scale: 0-10 Pain Score: 0-No pain    Therapy/Group: Individual Therapy  Jabriel Vanduyne 11/29/2018, 12:18 PM

## 2018-11-29 NOTE — Progress Notes (Signed)
PHYSICAL MEDICINE & REHABILITATION PROGRESS NOTE  Subjective/Complaints: No new issues. Leg pain tolerable. Has concerns about set up at home mostly  ROS: Patient denies fever, rash, sore throat, blurred vision, nausea, vomiting, diarrhea, cough, shortness of breath or chest pain, joint or back pain, headache, or mood change.   Objective: Vital Signs: Blood pressure 140/71, pulse 82, temperature 98 F (36.7 C), temperature source Oral, resp. rate 18, height 5\' 11"  (1.803 m), weight 92.2 kg, SpO2 98 %. No results found. Recent Labs    11/27/18 1354  WBC 10.7*  HGB 8.2*  HCT 26.4*  PLT 456*   Recent Labs    11/27/18 1355  NA 129*  K 4.4  CL 90*  CO2 23  GLUCOSE 95  BUN 61*  CREATININE 11.05*  CALCIUM 7.9*    Physical Exam: BP 140/71 (BP Location: Right Arm)   Pulse 82   Temp 98 F (36.7 C) (Oral)   Resp 18   Ht 5\' 11"  (1.803 m)   Wt 92.2 kg   SpO2 98%   BMI 28.35 kg/m  Constitutional: No distress . Vital signs reviewed. HEENT: EOMI, oral membranes moist Neck: supple Cardiovascular: RRR without murmur. No JVD    Respiratory: CTA Bilaterally without wheezes or rales. Normal effort    GI: BS +, non-tender, non-distended  Musculoskeletal:  Left stump wrapped, edema decreasing.  Right lower extremity amputation of digits  Neurological: He is alert and oriented Motor: RUE: 5/5 proximal to distal RLE: 4+/5 proximal to distal LUE: 4/5 proximal to distal (baseline), stable LUE tremor LLE: HF 4/5 (pain inhibition), KE 3/5 Sensation diminished to light touch left hand  (baseline) Skin:  Left stump with incision c/d/i, 2-3 blisters stable on posterior flap Open abrasion over left patella,   stable Right plantar foot ulcer  with dressing C/D/I--no change Psychiatric: cooperative  Assessment/Plan: 1. Functional deficits secondary to left BKA as well as right foot digit amputations which require 3+ hours per day of interdisciplinary therapy in a  comprehensive inpatient rehab setting.  Physiatrist is providing close team supervision and 24 hour management of active medical problems listed below.  Physiatrist and rehab team continue to assess barriers to discharge/monitor patient progress toward functional and medical goals  Care Tool:  Bathing    Body parts bathed by patient: Right arm, Left arm     Body parts n/a: Left lower leg   Bathing assist Assist Level: Minimal Assistance - Patient > 75%     Upper Body Dressing/Undressing Upper body dressing Upper body dressing/undressing activity did not occur (including orthotics): N/A What is the patient wearing?: Pull over shirt    Upper body assist Assist Level: Set up assist    Lower Body Dressing/Undressing Lower body dressing      What is the patient wearing?: Pants, Underwear/pull up     Lower body assist Assist for lower body dressing: Minimal Assistance - Patient > 75%     Toileting Toileting Toileting Activity did not occur (Clothing management and hygiene only): N/A (no void or bm)  Toileting assist Assist for toileting: Moderate Assistance - Patient 50 - 74%     Transfers Chair/bed transfer  Transfers assist  Chair/bed transfer activity did not occur: Safety/medical concerns  Chair/bed transfer assist level: Minimal Assistance - Patient > 75%     Locomotion Ambulation   Ambulation assist      Assist level: Moderate Assistance - Patient 50 - 74% Assistive device: Walker-rolling Max distance: 20   Walk  10 feet activity   Assist     Assist level: Moderate Assistance - Patient - 50 - 74% Assistive device: Walker-rolling   Walk 50 feet activity   Assist Walk 50 feet with 2 turns activity did not occur: Safety/medical concerns         Walk 150 feet activity   Assist Walk 150 feet activity did not occur: Safety/medical concerns         Walk 10 feet on uneven surface  activity   Assist Walk 10 feet on uneven surfaces  activity did not occur: Safety/medical concerns         Wheelchair     Assist Will patient use wheelchair at discharge?: Yes Type of Wheelchair: Manual    Wheelchair assist level: Supervision/Verbal cueing Max wheelchair distance: 150'    Wheelchair 50 feet with 2 turns activity    Assist        Assist Level: Supervision/Verbal cueing   Wheelchair 150 feet activity     Assist Wheelchair 150 feet activity did not occur: Safety/medical concerns   Assist Level: Supervision/Verbal cueing      Medical Problem List and Plan: 1.  Deficits with mobility, transfers, endurance, self-care secondary to left BKA.  -Continue CIR therapies including PT, OT     -[facility] spoke to pt about pros process yesterday.  2.  DVT Prophylaxis/Anticoagulation: Pharmaceutical: Heparin 3. Pain Management:   Continue oxycodone as needed with tramadol in between.  Robaxin scheduled on 11/28, increased on 11/29  -reasonable control at present 4. Mood: LCSW to follow for evaluation and support 5. Neuropsych: This patient is capable of making decisions on his own behalf. 6. Skin/Wound Care: Routine pressure relief measures.  Ordered PRAFO for right foot.     -reviewed left stump dressings with patient/nurse  -continue ACE with gradient wrap until blisters better 7. Fluids/Electrolytes/Nutrition: Strict I's and O's.  Carb modified/ renal diet with 1200 cc fluid restriction. 8.  T2DM: Monitor blood sugars AC at bedtime.  Continue Lantus and titrate as indicated for tighter control  Controlled 12/5 9.  ESRD: Continue hemodialysis MWF at end of day to help with tolerance of activity.  Metrodin on hold. 10.  Sepsis due to MRSA bacteremia: Vancomycin with hemodialysis through 12/9 - level WNL on 12/1 11.  Anemia of chronic disease: Monitor H&H with hemodialysis for trend.  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 13.  Drug-induced constipation  Bowel regimen increased on 11/28  BM 12/1 14. Elevated blood pressure  Controlled 12/3 15. Leukocytosis  WBCs 11.8 on 11/26--->10/7 12/2  Afebrile     LOS: 7 days A FACE TO FACE EVALUATION WAS PERFORMED  Meredith Staggers 11/29/2018, 9:31 AM

## 2018-11-29 NOTE — Patient Care Conference (Signed)
Inpatient RehabilitationTeam Conference and Plan of Care Update Date: 11/28/2018   Time: 2:30 PM    Patient Name: [name] SENTERS Sr.      Medical Record Number: [id number]  Date of Birth: [birth year] Sex: Male         Room/Bed: 4W21C/4W21C-01 Payor Info: Payor: MEDICARE / Plan: MEDICARE PART A AND B / Product Type: *No Product type* /    Admitting Diagnosis: bka  Admit Date/Time:  11/22/2018  3:21 PM Admission Comments: No comment available   Primary Diagnosis:  <principal problem not specified> Principal Problem: <principal problem not specified>  Patient Active Problem List   Diagnosis Date Noted  . Elevated blood pressure reading   . S/P unilateral BKA (below knee amputation), left ([place])   . Muscle spasms of both lower extremities   . [redacted] weeks gestation of pregnancy   . Diabetes mellitus type 2 in nonobese (HCC)   . Drug induced constipation   . Unilateral complete BKA, left, initial encounter ([place]) 11/22/2018  . Postoperative pain   . Type 2 diabetes mellitus with peripheral neuropathy ([facility])   . Bacteremia   . Anemia of chronic disease   . MRSA bacteremia 11/21/2018  . Leukocytosis   . Tobacco abuse   . Post-operative pain   . Normal coronary arteries 10/31/2018  . Syncope, non cardiac 10/31/2018  . Sepsis ([place]) 05/10/2018  . HLD (hyperlipidemia) 05/10/2018  . Diabetic ulcer of foot associated with diabetes mellitus due to underlying condition, with necrosis of muscle ([place]) 01/27/2018  . Onychomycosis of multiple toenails with type 1 diabetes mellitus ([place]) 01/27/2018  . Onychomycosis of multiple toenails with type 2 diabetes mellitus and peripheral angiopathy ([place]) 10/05/2017  . ESRD on hemodialysis ([place])   . Osteomyelitis of right foot ([place]) 02/03/2017  . Type 2 diabetes mellitus with left diabetic foot ulcer ([place]) 01/21/2017  . Osteomyelitis of left foot ([place]) 09/11/2016  . Peripheral arterial disease ([place]) 08/12/2015  . Paroxysmal supraventricular tachycardia  ([place]) 01/30/2015  . Occult blood in stools 11/25/2014  . Acute systolic heart failure ([place]) 11/25/2014  . Congestive dilated cardiomyopathy ([place]) 11/24/2014  . Visual changes 11/23/2014  . Cardiomegaly 11/23/2014  . Benign essential HTN 11/23/2014  . 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 Anemia due to chronic blood loss 11/23/2014  . Anemia due to end stage renal disease ([place]) 11/22/2014  . Symptomatic anemia   . Cellulitis and abscess of hand 01/28/2012  . HTN (hypertension) 01/28/2012  . Hyponatremia 01/28/2012  . Normocytic anemia 01/28/2012  . Sinusitis 01/28/2012    Expected Discharge Date: Expected Discharge Date: 12/02/18  Team Members Present: Physician leading conference: Dr. Alger Simons Social Worker Present: Alfonse Alpers, LCSW Nurse Present: Dorthula Nettles, RN;Karen Lovena Neighbours, RN PT Present: Roderic Ovens, PT OT Present: Cherylynn Ridges, OT SLP Present: Weston Anna, SLP PPS Coordinator present : Daiva Nakayama, RN, CRRN;Melissa Gertie Fey     Current Status/Progress Goal Weekly Team Focus  Medical   Left below-knee amputation.  Patient on hemodialysis.  Diabetic as well.  Wound care issues ongoing.  Pre-prosthetic education  Maximize healing and edema control of stump.  Maximize mobility  Wound care, blood pressure and diabetes management, hemodialysis after therapies   Bowel/Bladder   Continent of bowel/bladder; LBM 11/28/19  remain continent of bowel/bladder  Assess bowel/bladder function q shift and as needed   Swallow/Nutrition/ Hydration             ADL's   Min A overall  Supervision/mod I  modified bathing/dressing, dc planning, pt/family education, strengthening, endurance   Mobility   min A overall  supervision overall  pt/family education, activity tolerance, short distance gait   Communication             Safety/Cognition/ Behavioral Observations            Pain   c/o pain with activity, ultram q 6 hrs  prn  <2  Assess and treat pain q shift and as needed   Skin   Surgical incision to left leg (BKA) with staples, minimal drainage,vaseline gauze, abd, kerlix, ace bandage daily drsg change,  right foot diabetic ulcer to plantar surface behind great toe, cleanse daily, eucerin cream, foam  maintain skin integrity with min assist  Assess skin q shift and as needed    Rehab Goals Patient on target to meet rehab goals: Yes Rehab Goals Revised: none *See Care Plan and progress notes for long and short-term goals.     Barriers to Discharge  Current Status/Progress Possible Resolutions Date Resolved   Physician             See medical problem list in chart      Nursing                  PT                    OT                  SLP                SW                Discharge Planning/Teaching Needs:  Pt plans to return to his home with his wife or dtr to provide supervision level of care.  Family education prior to pt's d/c.   Team Discussion:  Pt with L BKA and wound on right foot.  Pt is motivated to work hard in therapy and to get his prosthesis as soon as he is able.  Pt's pain level is okay and he is asking MD appropriate questions.  RN to continue to place pink foam to burst blister on his right knee.  Pt is supervision with transfers and contact guard for short distance gait.  He will need to hop into BR and doesn't have a ramp to enter his home.  Pt is min A to supervision for ADLs and does great with his self care with overall S goals for both PT and OT.  Revisions to Treatment Plan:  none    Continued Need for Acute Rehabilitation Level of Care: The patient requires daily medical management by a physician with specialized training in physical medicine and rehabilitation for the following conditions: Daily direction of a multidisciplinary physical rehabilitation program to ensure safe treatment while eliciting the highest outcome that is of practical value to the patient.:  Yes Daily medical management of patient stability for increased activity during participation in an intensive rehabilitation regime.: Yes Daily analysis of laboratory values and/or radiology reports with any subsequent need for medication adjustment of medical intervention for : Post surgical problems;Renal problems;Wound care problems   I attest that I was present, lead the team conference, and concur with the assessment and plan of the team.   Wyolene Weimann, Silvestre Mesi 11/29/2018, 11:52 AM

## 2018-11-29 NOTE — Plan of Care (Signed)
  Problem: Consults Goal: RH LIMB LOSS PATIENT EDUCATION Description Description: See Patient Education module for eduction specifics. Outcome: Progressing Goal: Skin Care Protocol Initiated - if Braden Score 18 or less Description If consults are not indicated, leave blank or document N/A Outcome: Progressing Goal: Nutrition Consult-if indicated Outcome: Progressing   Problem: RH BOWEL ELIMINATION Goal: RH STG MANAGE BOWEL WITH ASSISTANCE Description STG Manage Bowel with min  Assistance.  Outcome: Progressing   Problem: RH SKIN INTEGRITY Goal: RH STG SKIN FREE OF INFECTION/BREAKDOWN Description Patient will have infection managed with antibiotics at discharge.  Outcome: Progressing Goal: RH STG MAINTAIN SKIN INTEGRITY WITH ASSISTANCE Description STG Maintain Skin Integrity With min Assistance.  Outcome: Progressing Goal: RH STG ABLE TO PERFORM INCISION/WOUND CARE W/ASSISTANCE Description STG Able To Perform Incision/Wound Care With mod Assistance.  Outcome: Progressing   Problem: RH SAFETY Goal: RH STG ADHERE TO SAFETY PRECAUTIONS W/ASSISTANCE/DEVICE Description STG Adhere to Safety Precautions With  Min Assistance/Device.  Outcome: Progressing   Problem: RH PAIN MANAGEMENT Goal: RH STG PAIN MANAGED AT OR BELOW PT'S PAIN GOAL Description Less than 4 on 0-10 scale  Outcome: Progressing   Problem: RH KNOWLEDGE DEFICIT LIMB LOSS Goal: RH STG INCREASE KNOWLEDGE OF SELF CARE AFTER LIMB LOSS Description Patient will be able to verbalize how to manage care for self at discharge.  Outcome: Progressing

## 2018-11-30 ENCOUNTER — Encounter (HOSPITAL_COMMUNITY): Payer: Medicare Other | Admitting: Occupational Therapy

## 2018-11-30 ENCOUNTER — Inpatient Hospital Stay (HOSPITAL_COMMUNITY): Payer: Medicare Other | Admitting: Physical Therapy

## 2018-11-30 ENCOUNTER — Inpatient Hospital Stay (HOSPITAL_COMMUNITY): Payer: Medicare Other | Admitting: Occupational Therapy

## 2018-11-30 LAB — GLUCOSE, CAPILLARY
GLUCOSE-CAPILLARY: 93 mg/dL (ref 70–99)
Glucose-Capillary: 143 mg/dL — ABNORMAL HIGH (ref 70–99)
Glucose-Capillary: 55 mg/dL — ABNORMAL LOW (ref 70–99)
Glucose-Capillary: 104 mg/dL — ABNORMAL HIGH (ref 70–99)
Glucose-Capillary: 105 mg/dL — ABNORMAL HIGH (ref 70–99)

## 2018-11-30 NOTE — Progress Notes (Signed)
Patient refused safety seat belt during shift. Nurse to follow up with therapy to determine if patient is safe without it.

## 2018-11-30 NOTE — Progress Notes (Signed)
Occupational Therapy Session Note  Patient Details  Name: [name] REZNIK Sr. MRN: [id number] Date of Birth: 15-Jul-1951  Today's Date: 11/30/2018  Session 1 OT Individual Time: 7561-2548 OT Individual Time Calculation (min): 57 min   Session 2 OT Individual Time: 1130-1200 OT Individual Time Calculation (min): 30 min    Short Term Goals: Week 1:  OT Short Term Goal 1 (Week 1): LTG=STG 2/2 ELOS  Skilled Therapeutic Interventions/Progress Updates:    Session 1 OT treatment session focused on pt/family education and self-care. Pt greeted with spouse and daughter present for family ed. Pt propelled wc to therapy apartment and practiced ambulating into bathroom w/ RW and assistance from spouse. Practiced tub bench transfer and dicussed home bathroom set-up and modifications for safe BADL participation. Pt will be limited to sponge baths initially, but hopeful to eventually get upstairs to full bath. Pt propelled wc back to room and completed bathing/dressing from wc level with overall supervision and increased time. Pt left seated in wc with spouse present and needs met in preparation for next therapy session.   Session 2 Pt greeted seated in wc and agreeable to OT treatment session. Nurse tech took sugar prior to leaving room and blood glucose down in the 50's but asymptomatic. Brought pt orange juice and grahm crackers/peanut butter. Nurse tech to return to check sugar after session. Pt propelled wc to day room and OT provided pt with handout for residual limb wrapping. Had pt practice on himself and reviewed importance of edema control. Pt returned to room and left seated in wc with lunch tray and nursing notified.   Therapy Documentation Precautions:  Precautions Precautions: Fall Precaution Comments: L BKA Required Braces or Orthoses: (No limb protector in room) Restrictions Weight Bearing Restrictions: No LLE Weight Bearing: Non weight bearing Pain: Pain Assessment Pain Scale:  0-10 Pain Score: 0-No pain Faces Pain Scale: No hurt Pain Type: Acute pain Pain Location: Leg Pain Orientation: Left Pain Descriptors / Indicators: Aching Pain Frequency: Intermittent Pain Onset: On-going Patients Stated Pain Goal: 0   Other Treatments:    Therapy/Group: Individual Therapy  Valma Cava 11/30/2018, 12:28 PM

## 2018-11-30 NOTE — Progress Notes (Signed)
[facility] PHYSICAL MEDICINE & REHABILITATION PROGRESS NOTE  Subjective/Complaints: Feeling well. "blister broke"  ROS: Patient denies fever, rash, sore throat, blurred vision, nausea, vomiting, diarrhea, cough, shortness of breath or chest pain, joint or back pain, headache, or mood change.   Objective: Vital Signs: Blood pressure 134/62, pulse 73, temperature 98.8 F (37.1 C), temperature source Oral, resp. rate 18, height 5\' 11"  (1.803 m), weight 92.8 kg, SpO2 99 %. No results found. Recent Labs    11/27/18 1354 11/29/18 1245  WBC 10.7* 10.3  HGB 8.2* 8.4*  HCT 26.4* 27.9*  PLT 456* 437*   Recent Labs    11/27/18 1355 11/29/18 1245  NA 129* 132*  K 4.4 4.7  CL 90* 90*  CO2 23 26  GLUCOSE 95 105*  BUN 61* 56*  CREATININE 11.05* 9.36*  CALCIUM 7.9* 8.5*    Physical Exam: BP 134/62 (BP Location: Right Arm)   Pulse 73   Temp 98.8 F (37.1 C) (Oral)   Resp 18   Ht 5\' 11"  (1.803 m)   Wt 92.8 kg   SpO2 99%   BMI 28.53 kg/m  Constitutional: No distress . Vital signs reviewed. HEENT: EOMI, oral membranes moist Neck: supple Cardiovascular: RRR without murmur. No JVD    Respiratory: CTA Bilaterally without wheezes or rales. Normal effort    GI: BS +, non-tender, non-distended  Musculoskeletal:  Left stump with less edema, some blisters opened. Warm, viable Right lower extremity amputation of digits  Neurological: He is alert and oriented Motor: RUE: 5/5 proximal to distal RLE: 4+/5 proximal to distal LUE: 4/5 proximal to distal LUE tremor LLE: HF 4/5,  KE 3/5 Sensation diminished to light touch left hand  --no change Skin:  Left stump with incision c/d/i, 2-3 blisters stable on posterior flap Open abrasion over left patella,    Right plantar foot ulcer  with dressing C/D/I--no changes Psychiatric: cooperative  Assessment/Plan: 1. Functional deficits secondary to left BKA as well as right foot digit amputations which require 3+ hours per day of  interdisciplinary therapy in a comprehensive inpatient rehab setting.  Physiatrist is providing close team supervision and 24 hour management of active medical problems listed below.  Physiatrist and rehab team continue to assess barriers to discharge/monitor patient progress toward functional and medical goals  Care Tool:  Bathing    Body parts bathed by patient: Right arm, Left arm     Body parts n/a: Left lower leg   Bathing assist Assist Level: Minimal Assistance - Patient > 75%     Upper Body Dressing/Undressing Upper body dressing Upper body dressing/undressing activity did not occur (including orthotics): N/A What is the patient wearing?: Pull over shirt    Upper body assist Assist Level: Set up assist    Lower Body Dressing/Undressing Lower body dressing      What is the patient wearing?: Pants, Underwear/pull up     Lower body assist Assist for lower body dressing: Minimal Assistance - Patient > 75%     Toileting Toileting Toileting Activity did not occur (Clothing management and hygiene only): N/A (no void or bm)  Toileting assist Assist for toileting: Contact Guard/Touching assist     Transfers Chair/bed transfer  Transfers assist  Chair/bed transfer activity did not occur: Safety/medical concerns  Chair/bed transfer assist level: Independent with assistive device     Locomotion Ambulation   Ambulation assist      Assist level: Supervision/Verbal cueing Assistive device: Walker-rolling Max distance: 20   Walk 10 feet activity  Assist     Assist level: Supervision/Verbal cueing Assistive device: Walker-rolling   Walk 50 feet activity   Assist Walk 50 feet with 2 turns activity did not occur: Safety/medical concerns         Walk 150 feet activity   Assist Walk 150 feet activity did not occur: Safety/medical concerns         Walk 10 feet on uneven surface  activity   Assist Walk 10 feet on uneven surfaces activity  did not occur: Safety/medical concerns         Wheelchair     Assist Will patient use wheelchair at discharge?: Yes Type of Wheelchair: Manual    Wheelchair assist level: Independent Max wheelchair distance: 150    Wheelchair 50 feet with 2 turns activity    Assist        Assist Level: Independent   Wheelchair 150 feet activity     Assist Wheelchair 150 feet activity did not occur: Safety/medical concerns   Assist Level: Independent      Medical Problem List and Plan: 1.  Deficits with mobility, transfers, endurance, self-care secondary to left BKA.  -Continue CIR therapies including PT, OT     -provided education re: stump/wound today  2.  DVT Prophylaxis/Anticoagulation: Pharmaceutical: Heparin 3. Pain Management:   Continue oxycodone as needed with tramadol in between.  Robaxin scheduled on 11/28, increased on 11/29  -controlled 4. Mood: LCSW to follow for evaluation and support 5. Neuropsych: This patient is capable of making decisions on his own behalf. 6. Skin/Wound Care: Routine pressure relief measures.  Ordered PRAFO for right foot.     -reviewed left stump dressings with patient/nurse  -continue ACE with gradient wrap until blisters better 7. Fluids/Electrolytes/Nutrition: Strict I's and O's.  Carb modified/ renal diet with 1200 cc fluid restriction. 8.  T2DM: Monitor blood sugars AC at bedtime.  Continue Lantus and titrate as indicated for tighter control  Controlled 12/5 9.  ESRD: Continue hemodialysis MWF at end of day to help with tolerance of activity.  Metrodin on hold. 10.  Sepsis due to MRSA bacteremia: Vancomycin with hemodialysis   11.  Anemia of chronic disease: Monitor H&H with hemodialysis for trend.  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  13.  Drug-induced constipation  Bowel regimen increased on 11/28  BM 12/1 14.  Elevated blood pressure  Controlled 12/3 15. Leukocytosis  WBCs 11.8 on 11/26--->10/7 12/2  Afebrile     LOS: 8 days A FACE TO FACE EVALUATION WAS PERFORMED  Meredith Staggers 11/30/2018, 11:03 AM

## 2018-11-30 NOTE — Progress Notes (Signed)
  [facility] Progress Note   Subjective:  Seen during PT. No c/o today. No CP/dyspnea. Sounds like plan remains for d/c on Saturday.   Objective Vitals:   11/29/18 2004 11/30/18 0549 11/30/18 0708 11/30/18 1350  BP: 110/61 134/62  137/62  Pulse: 82 73  79  Resp: 16 18  16   Temp: 99.1 F (37.3 C) 98.8 F (37.1 C)  98.2 F (36.8 C)  TempSrc:  Oral    SpO2: 96% 99%    Weight:   92.8 kg   Height:       Physical Exam General:Well appearing man, NAD Heart:RRR; 2/6 murmur Lungs:CTAB Extremities:L BKA stump bandaged (C/D/I), R leg without edema Dialysis Access:AVF  Additional Objective Labs: Basic Metabolic Panel: Recent Labs  Lab 11/24/18 1321 11/27/18 1355 11/29/18 1245  NA 128* 129* 132*  K 4.5 4.4 4.7  CL 91* 90* 90*  CO2 23 23 26   GLUCOSE 115* 95 105*  BUN 67* 61* 56*  CREATININE 10.51* 11.05* 9.36*  CALCIUM 7.8* 7.9* 8.5*  PHOS 4.4 4.0 3.5   Liver Function Tests: Recent Labs  Lab 11/24/18 1321 11/27/18 1355 11/29/18 1245  ALBUMIN 2.5* 2.5* 2.6*   CBC: Recent Labs  Lab 11/24/18 1321 11/27/18 1354 11/29/18 1245  WBC 12.3* 10.7* 10.3  HGB 8.2* 8.2* 8.4*  HCT 27.8* 26.4* 27.9*  MCV 93.9 90.7 91.8  PLT 410* 456* 437*   CBG: Recent Labs  Lab 11/29/18 1713 11/29/18 2119 11/30/18 0631 11/30/18 1134 11/30/18 1209  GLUCAP 163* 87 143* 55* 93   Medications: . vancomycin 1,000 mg (11/29/18 1610)   . atorvastatin  40 mg Oral QHS  . Chlorhexidine Gluconate Cloth  6 each Topical Q0600  . darbepoetin (ARANESP) injection - DIALYSIS  25 mcg Intravenous Q Mon-HD  . doxercalciferol  3 mcg Intravenous Q M,W,F-HD  . feeding supplement (PRO-STAT SUGAR FREE 64)  30 mL Oral BID  . ferric citrate  210 mg Oral TID WC  . heparin  5,000 Units Subcutaneous Q8H  . hydrocerin   Topical BID  . insulin aspart  0-9 Units Subcutaneous TID WC  . insulin aspart protamine- aspart  5 Units Subcutaneous BID WC  . methocarbamol  1,000 mg Oral QID  .  multivitamin with minerals  1 tablet Oral Q supper  . polyethylene glycol  17 g Oral Daily  . primidone  50 mg Oral QHS  . senna-docusate  2 tablet Oral BID  . timolol  1 drop Left Eye BID    Dialysis Orders: [facility] MWF  4h 79min500/800-14 gauge needles95.5kg 2/2.25 bathP2 AVF Hep none -Hectorol 3 mcg IV TIW -Parsabiv 7.5 mg IV TIW  Assessment/Plan: 1. MRSA bacteremia: D/tchronic bilateral foot wounds (had prior multiple MRSA wound cultures in Sept/Oct) Hx osteomyelitis of L foot. BCx11/19 +MRSA, repeat BCx 11/22 negative. 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 On IV Vancomycin through12/9/19. 2. ESRD: Continue HD per usual MWF schedule,next 12/6. 3. BP/volume: BP chronically low, on midodrine as outpatient. Will need lower EDW on d/c (s/p amputation). 4. Anemia: Hgb8.4. Typically with ^ Hgb. Aranesp 79mcg weekly started 12/2. 5. MBD-CKD: CorrCa/Phos ok.Continue Hectorol/Auryxia binder. No Parsabiv here. 6. Nutrition: Alb low, continue supplements. 7. NICM EF 55-60% in 02/2018  8. PAD(HxL tibial angioplasty/stenting 04/2018):Followed Dr. Scot Dock. On Plavix 9. Type 2 DM 10. Rfoot wound: No osteo on MRI -- getting wound care and IV abx, as above.  Veneta Penton, PA-C 11/30/2018, 2:47 PM  [facility] Pager: [phone]

## 2018-11-30 NOTE — Progress Notes (Addendum)
Physical Therapy Session Note  Patient Details  Name: [name] GOMES Sr. MRN: [id number] Date of Birth: [birth year]  Today's Date: 11/30/2018 PT Individual Time: 3149-7026 and 3785-8850 PT Individual Time Calculation (min): 54 min and 39 min   Short Term Goals: Week 1:  PT Short Term Goal 1 (Week 1): = LTG  Skilled Therapeutic Interventions/Progress Updates:    session focused on pt/family education with pt's wife. Pt/wife were educated on and performed gait forward and side stepping with RW with supervision.  Pt able to perform furniture transfers, car transfers and bed transfers all with mod I.  Mod I for w/c mobility and parts management.  Pt/wife educated on kitchen and household mobility,safe set up and energy conservation.  Both express understanding.  Pt provided with HEP and verbalizes understanding of all exercises.  Pt/wife feel comfortable to d/c home at this level of care.  Session 2: no c/o pain.  Session focused on therex for strength and ROM for residual limb. Pt performs sidelying, supine and prone therex for strengthening and ROM for residual limb with use of HEP handout and supervision cuing.  Pt able to perform all therex without c/o pain. Pt performs all transfers with mod I. Pt left in bed with alarm set, needs at hand.  Therapy Documentation Precautions:  Precautions Precautions: Fall Precaution Comments: L BKA Required Braces or Orthoses: (No limb protector in room) Restrictions Weight Bearing Restrictions: No LLE Weight Bearing: Non weight bearing Pain: No c/o pain   Therapy/Group: Individual Therapy  [name] [name] 11/30/2018, 10:25 AM

## 2018-11-30 NOTE — Progress Notes (Signed)
Pharmacy Antibiotic Note  [name]. is a 67 y.o. male admitted on 11/22/2018 with sepsis secondary to MRSA bacteremia (from outside HD cultures) and osteomyelitis. Patient has ESRD on HD MWF. Pharmacy has been consulted for vancomycin dosing.  He is s/p BKA. Vanc plan is in place to be stopped on 12/9. His vanc level has been therapeutic.   Plan: Vancomycin 1g IV after each HD session (MWF) until 12/9 Rx sign off    Height: 5\' 11"  (180.3 cm) Weight: 204 lb 9.4 oz (92.8 kg) IBW/kg (Calculated) : 75.3  Temp (24hrs), Avg:98.6 F (37 C), Min:98.1 F (36.7 C), Max:99.1 F (37.3 C)  Recent Labs  Lab 11/24/18 0614 11/24/18 1321 11/27/18 0609 11/27/18 1354 11/27/18 1355 11/29/18 1245  WBC  --  12.3*  --  10.7*  --  10.3  CREATININE  --  10.51*  --   --  11.05* 9.36*  VANCORANDOM 26  --  23  --   --   --     Estimated Creatinine Clearance: 8.9 mL/min (A) (by C-G formula based on SCr of 9.36 mg/dL (H)).    Allergies  Allergen Reactions  . Penicillins Hives, Swelling and Other (See Comments)    Ankle swelling Has patient had a PCN reaction causing immediate rash, facial/tongue/throat swelling, SOB or lightheadedness with hypotension: Yes Has patient had a PCN reaction causing severe rash involving mucus membranes or skin necrosis: No Has patient had a PCN reaction that required hospitalization: No Has patient had a PCN reaction occurring within the last 10 years: No If all of the above answers are "NO", then may proceed with Cephalosporin use.    Antimicrobials this admission: Cefepime 11/22>>11/24 Vancomycin 11/19>>12/9  Microbiology results: 11/19 Outside HD BCx: MRSA 11/22 BCx: ngtd   Onnie Boer, PharmD, BCIDP, AAHIVP, CPP Infectious Disease Pharmacist 11/30/2018 9:36 AM

## 2018-12-01 ENCOUNTER — Inpatient Hospital Stay (HOSPITAL_COMMUNITY): Payer: Medicare Other | Admitting: Occupational Therapy

## 2018-12-01 ENCOUNTER — Inpatient Hospital Stay (HOSPITAL_COMMUNITY): Payer: Medicare Other | Admitting: Physical Therapy

## 2018-12-01 LAB — GLUCOSE, CAPILLARY
GLUCOSE-CAPILLARY: 148 mg/dL — AB (ref 70–99)
GLUCOSE-CAPILLARY: 178 mg/dL — AB (ref 70–99)
Glucose-Capillary: 154 mg/dL — ABNORMAL HIGH (ref 70–99)
Glucose-Capillary: 68 mg/dL — ABNORMAL LOW (ref 70–99)

## 2018-12-01 MED ORDER — METHOCARBAMOL 750 MG PO TABS: 750.0000 mg | ORAL_TABLET | Freq: Four times a day (QID) | ORAL | 0 refills | Status: DC

## 2018-12-01 MED ORDER — HYDROCERIN EX CREA: 1.0000 "application " | TOPICAL_CREAM | Freq: Two times a day (BID) | CUTANEOUS | 0 refills | Status: DC

## 2018-12-01 MED ORDER — VANCOMYCIN HCL IN DEXTROSE 1-5 GM/200ML-% IV SOLN: 1000.0000 mg | INTRAVENOUS | Status: DC

## 2018-12-01 MED ORDER — VANCOMYCIN HCL IN DEXTROSE 1-5 GM/200ML-% IV SOLN: 1000.0000 mg | INTRAVENOUS | Status: AC

## 2018-12-01 MED ORDER — PRIMIDONE 50 MG PO TABS: 50.0000 mg | ORAL_TABLET | Freq: Every day | ORAL | 0 refills | Status: DC

## 2018-12-01 MED ORDER — DOXERCALCIFEROL 4 MCG/2ML IV SOLN
INTRAVENOUS | Status: AC
  Administered 2018-12-01: 3 ug via INTRAVENOUS
  Filled 2018-12-01: qty 2

## 2018-12-01 MED ORDER — TRAMADOL HCL 50 MG PO TABS: 50.0000 mg | ORAL_TABLET | Freq: Four times a day (QID) | ORAL | 0 refills | Status: DC | PRN

## 2018-12-01 MED ORDER — FREESTYLE LIBRE 14 DAY SENSOR MISC: 1.0000 | 0 refills | Status: DC

## 2018-12-01 MED ORDER — VANCOMYCIN HCL IN DEXTROSE 1-5 GM/200ML-% IV SOLN
INTRAVENOUS | Status: AC
  Administered 2018-12-01: 1000 mg via INTRAVENOUS
  Filled 2018-12-01: qty 200

## 2018-12-01 MED ORDER — POLYETHYLENE GLYCOL 3350 17 G PO PACK: 17.0000 g | PACK | Freq: Every day | ORAL | 0 refills | Status: DC

## 2018-12-01 MED ORDER — SENNOSIDES-DOCUSATE SODIUM 8.6-50 MG PO TABS: 2.0000 | ORAL_TABLET | Freq: Two times a day (BID) | ORAL | 0 refills | Status: DC

## 2018-12-01 NOTE — Plan of Care (Signed)
  Problem: Consults Goal: RH LIMB LOSS PATIENT EDUCATION Description Description: See Patient Education module for eduction specifics. Outcome: Progressing Goal: Skin Care Protocol Initiated - if Braden Score 18 or less Description If consults are not indicated, leave blank or document N/A Outcome: Progressing Goal: Nutrition Consult-if indicated Outcome: Progressing   Problem: RH BOWEL ELIMINATION Goal: RH STG MANAGE BOWEL WITH ASSISTANCE Description STG Manage Bowel with min  Assistance.  Outcome: Progressing   Problem: RH SKIN INTEGRITY Goal: RH STG SKIN FREE OF INFECTION/BREAKDOWN Description Patient will have infection managed with antibiotics at discharge.  Outcome: Progressing Goal: RH STG MAINTAIN SKIN INTEGRITY WITH ASSISTANCE Description STG Maintain Skin Integrity With min Assistance.  Outcome: Progressing Goal: RH STG ABLE TO PERFORM INCISION/WOUND CARE W/ASSISTANCE Description STG Able To Perform Incision/Wound Care With mod Assistance.  Outcome: Progressing   Problem: RH SAFETY Goal: RH STG ADHERE TO SAFETY PRECAUTIONS W/ASSISTANCE/DEVICE Description STG Adhere to Safety Precautions With  Min Assistance/Device.  Outcome: Progressing   Problem: RH PAIN MANAGEMENT Goal: RH STG PAIN MANAGED AT OR BELOW PT'S PAIN GOAL Description Less than 4 on 0-10 scale  Outcome: Progressing   Problem: RH KNOWLEDGE DEFICIT LIMB LOSS Goal: RH STG INCREASE KNOWLEDGE OF SELF CARE AFTER LIMB LOSS Description Patient will be able to verbalize how to manage care for self at discharge.  Outcome: Progressing

## 2018-12-01 NOTE — Progress Notes (Signed)
Patient ID: [name] PUROHIT Sr., male   DOB: [birth year], 67 y.o.   MRN: [id number]  [facility] Progress Note   Assessment/ Plan:   1.  Chronic bilateral foot wounds with MRSA bacteremia: With left foot osteomyelitis now status post left below-knee amputation on 11/20/2018 and progressing well with inpatient rehabilitation-plans noted for discharge home tomorrow.  On antibiotics for right foot wound with background history of PAD. 2.ESRD: Continue hemodialysis on a Monday/Wednesday/Friday schedule with dialysis later this afternoon 3. Anemia: Low but stable hemoglobin/hematocrit without overt loss.  On ESA. 4. CKD-MBD: With calcium and phosphorus levels currently at goal on [place].  Continue Hectorol for PTH suppression and resume Parsabiv at dialysis. 5. Nutrition: Low albumin secondary to acute phase reaction with recent infection/surgery-continue ONS 6. Hypertension: Blood pressure acceptable and euvolemic volume status on physical exam-monitor with hemodialysis.  Subjective:   Reports to be feeling fair and looking forward to going home tomorrow as they work on installing a ramp at his house.   Objective:   BP (!) 145/68 (BP Location: Right Arm)   Pulse 75   Temp 98.4 F (36.9 C) (Oral)   Resp 17   Ht 5\' 11"  (1.803 m)   Wt 93.3 kg   SpO2 98%   BMI 28.69 kg/m   Physical Exam: Gen: Comfortably sitting up in wheelchair, just back from therapy CVS: Pulse regular rhythm, normal rate, S1 and S2 normal Resp: Clear to auscultation, no rales/rhonchi Abd: Soft, flat, nontender Ext: Right leg without edema, left BKA stump pain clean/dry dressing  Labs: BMET Recent Labs  Lab 11/24/18 1321 11/27/18 1355 11/29/18 1245  NA 128* 129* 132*  K 4.5 4.4 4.7  CL 91* 90* 90*  CO2 23 23 26   GLUCOSE 115* 95 105*  BUN 67* 61* 56*  CREATININE 10.51* 11.05* 9.36*  CALCIUM 7.8* 7.9* 8.5*  PHOS 4.4 4.0 3.5   CBC Recent Labs  Lab 11/24/18 1321 11/27/18 1354 11/29/18 1245   WBC 12.3* 10.7* 10.3  HGB 8.2* 8.2* 8.4*  HCT 27.8* 26.4* 27.9*  MCV 93.9 90.7 91.8  PLT 410* 456* 437*   Medications:    . atorvastatin  40 mg Oral QHS  . Chlorhexidine Gluconate Cloth  6 each Topical Q0600  . darbepoetin (ARANESP) injection - DIALYSIS  25 mcg Intravenous Q Mon-HD  . doxercalciferol  3 mcg Intravenous Q M,W,F-HD  . feeding supplement (PRO-STAT SUGAR FREE 64)  30 mL Oral BID  . ferric citrate  210 mg Oral TID WC  . heparin  5,000 Units Subcutaneous Q8H  . hydrocerin   Topical BID  . insulin aspart  0-9 Units Subcutaneous TID WC  . insulin aspart protamine- aspart  5 Units Subcutaneous BID WC  . methocarbamol  1,000 mg Oral QID  . multivitamin with minerals  1 tablet Oral Q supper  . polyethylene glycol  17 g Oral Daily  . primidone  50 mg Oral QHS  . senna-docusate  2 tablet Oral BID  . timolol  1 drop Left Eye BID   Elmarie Shiley, MD 12/01/2018, 10:43 AM

## 2018-12-01 NOTE — Progress Notes (Signed)
Physical Therapy Discharge Summary  Patient Details  Name: [name] NEESON Sr. MRN: [id number] Date of Birth: 1951/10/08  Today's Date: 12/01/2018 PT Individual Time: 8921-1941 PT Individual Time Calculation (min): 70 min   Pt performs all w/c mobility, transfers and bed mobility with mod I.  W/c training up/down various ramps with initial supervision, progressing to mod I. Pt performs HEP for prone, supine and sidelying therex with pt able to perform with min cuing, all 2 x 12.  Pt performs gait with RW and supervision 25' x 2.  nustep x 5 minutes level 5 for UE/LE strength and endurance. Pt states he feels prepared for d/c home tomorrow.  Patient has met 6 of 6 long term goals due to improved activity tolerance, improved balance, increased strength, decreased pain and ability to compensate for deficits.  Patient to discharge at a wheelchair level Modified Independent.   Patient's care partner is independent to provide the necessary supervision assistance at discharge.  Reasons goals not met: n/a  Recommendation:  Patient will benefit from ongoing skilled PT services in home health setting to continue to advance safe functional mobility, address ongoing impairments in strength, gait, balance, and minimize fall risk.  Equipment: w/c  Reasons for discharge: treatment goals met and discharge from hospital  Patient/family agrees with progress made and goals achieved: Yes  PT Discharge Precautions/Restrictions Precautions Precautions: Fall Precaution Comments: L BKA Restrictions Weight Bearing Restrictions: No LLE Weight Bearing: Non weight bearing Pain Pain Assessment Faces Pain Scale: No hurt  Cognition Overall Cognitive Status: Within Functional Limits for tasks assessed Arousal/Alertness: Awake/alert Orientation Level: Oriented X4;Oriented to person;Oriented to time;Oriented to situation;Oriented to place Safety/Judgment: Appears intact Sensation Sensation Central  sensation comments: Nueropothy in R LE Proprioception: Appears Intact Coordination Gross Motor Movements are Fluid and Coordinated: Yes Fine Motor Movements are Fluid and Coordinated: Yes Motor  Motor Motor - Skilled Clinical Observations: generalized weakness Motor - Discharge Observations: improving strength  Mobility Bed Mobility Supine to Sit: Independent Transfers Sit to Stand: Supervision/Verbal cueing Squat Pivot Transfers: Independent with assistive device Locomotion  Gait Ambulation: Yes Gait Assistance: Supervision/Verbal cueing Gait Distance (Feet): 25 Feet Assistive device: Rolling walker Stairs / Additional Locomotion Stairs: No Product manager Assistance: Independent with assistive device Wheelchair Propulsion: Both upper extremities Wheelchair Parts Management: Independent Distance: 200  Trunk/Postural Assessment  Cervical Assessment Cervical Assessment: Within Functional Limits Thoracic Assessment Thoracic Assessment: Within Functional Limits Lumbar Assessment Lumbar Assessment: Within Functional Limits Postural Control Postural Control: Within Functional Limits  Balance Static Standing Balance Static Standing - Level of Assistance: 5: Stand by assistance Dynamic Standing Balance Dynamic Standing - Level of Assistance: 5: Stand by assistance Extremity Assessment      RLE Assessment General Strength Comments: grossly 4/5 LLE Assessment General Strength Comments: grossly 4/5    Daksha Koone 12/01/2018, 12:50 PM

## 2018-12-01 NOTE — Discharge Summary (Signed)
Physician Discharge Summary  Patient ID: [name] HIJAZI Sr. MRN: [id number] DOB/AGE: 1951/02/26 67 y.o.  Admit date: 11/22/2018 Discharge date: 12/02/2018  Discharge Diagnoses:  Principal Problem:   S/P unilateral BKA (below knee amputation), left (HCC) Active Problems:   ESRD on hemodialysis (HCC)   Postoperative pain   Type 2 diabetes mellitus with peripheral neuropathy (HCC)   Bacteremia   Anemia of chronic disease   Muscle spasms of both lower extremities   Drug induced constipation  .bmet Discharged Condition: stable  Significant Diagnostic Studies: N/A   Labs:  Basic Metabolic Panel: BMP Latest Ref Rng & Units 11/29/2018 11/27/2018 11/24/2018  Glucose 70 - 99 mg/dL 105(H) 95 115(H)  BUN 8 - 23 mg/dL 56(H) 61(H) 67(H)  Creatinine 0.61 - 1.24 mg/dL 9.36(H) 11.05(H) 10.51(H)  Sodium 135 - 145 mmol/L 132(L) 129(L) 128(L)  Potassium 3.5 - 5.1 mmol/L 4.7 4.4 4.5  Chloride 98 - 111 mmol/L 90(L) 90(L) 91(L)  CO2 22 - 32 mmol/L '26 23 23  ' Calcium 8.9 - 10.3 mg/dL 8.5(L) 7.9(L) 7.8(L)    CBC: CBC Latest Ref Rng & Units 11/29/2018 11/27/2018 11/24/2018  WBC 4.0 - 10.5 K/uL 10.3 10.7(H) 12.3(H)  Hemoglobin 13.0 - 17.0 g/dL 8.4(L) 8.2(L) 8.2(L)  Hematocrit 39.0 - 52.0 % 27.9(L) 26.4(L) 27.8(L)  Platelets 150 - 400 K/uL 437(H) 456(H) 410(H)    CBG: Recent Labs  Lab 12/01/18 0548 12/01/18 1150 12/01/18 1853 12/01/18 2138 12/02/18 0600  GLUCAP 154* 68* 148* 178* 125*    Brief HPI:   [name] Sages Sr. 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  He was admitted on 11/17/2018 with malaise, fevers and left foot pain due to sepsis from staph bacteremia.  Patient had refused amputation multiple times in the past and MRI of foot done revealing active osteomyelitis with edema.  He was agreeable to undergo left BKA on  11/20/2018 by Dr. [name].  ID recommended 14 total days of antibiotic regimen through 12/9 as source of infection had been amputated.  Therapy initiated and revealing  functional deficits.  CIR was recommended for follow-up therapy.   Hospital Course: [name] KIRKENDALL Sr. was admitted to rehab 11/22/2018 for inpatient therapies to consist of PT and OT at least three hours five days a week. Past admission physiatrist, therapy team and rehab RN have worked together to provide customized collaborative inpatient rehab.  He was maintained on vancomycin during his stay and was tolerating this without side effects.  Reactive leukocytosis has resolved.  He is to continue antibiotics through 12/9 to complete 14-day antibiotic regimen.  Left BKA site has been monitored for healing.  He was noted to have blistering on posterior flap that did rupture but no signs of ischemia noted.  He also developed blister about his knee that ruptured and h has been covered by foam dressing.  Area on posterior flap covered with Vaseline gauze as well as dry dressing and patient instructed to change dressing daily.  Protein supplements were added to help promote wound healing.  Blood pressures have been monitored on twice daily basis and have been reasonably controlled.  No hypertension noted and midodrine has been on hold.  Dialysis has been ongoing on MWF at the end of the day to help tolerate therapy sessions.  Pain has been controlled with prn use of ultram.   Bowel program has been  augmented to help manage constipation diabetes has been monitored with ac/hs CBG checks and blood sugars have been reasonably controlled.  He is to continue monitor blood sugars AC at bedtime and resume on home dose insulin regimen.  He has made steady progress during his stay and is currently modified independent at wheelchair level.  He will continue to receive further follow-up home health PT, OT and RN by [employer] [facility] home health.   Rehab course: During  patient's stay in rehab team conference was held discuss patient's progress, set goals and discuss barriers to discharge. At admission, patient required min assist with ADL task and mobility. He  has had improvement in activity tolerance, balance, postural control as well as ability to compensate for deficits.  He is able to complete ADL tasks with supervision to modified independent level.  He is modified independent for squat pivot transfers and requires supervision for sit to stand transfers.  He is able to ablate 25 feet with rolling walker and supervision with cues.  Family education was completed regarding all aspects of safety and mobility   Disposition:  Home  Diet: Renal diet/Carb Modified  Special Instructions: 1. Monitor BS ac/hs. 2. Keep incision clean and dry. Contact MD if you develop any problems with your incision/wound--redness, swelling, increase in pain, drainage or if you develop fever or chills.   Discharge Instructions    AMB Referral to [place] Management   Complete by:  As directed    Please assign to [facility] for transition of care and [facility] social worker for community resources regarding ramp assistance and follow up on SCAT application status. Please see hospital liaison notes from 12/6 for further details. Goes to HD on Monday, Wednesdays, Fridays. Eligible for [facility] due to going to [employer]. [facility] written consent obtained. Please call with questions. Multiple co morbidities. Unplanned risk of readmission score of 26% (high). Thanks. Marthenia Rolling, MSN-Ed, [facility] [phone]   Reason for consult:  Please assign to [place] and [facility] social worker.   Diagnoses of:  Diabetes   Expected date of contact:  1-3 days (reserved for hospital discharges)   Ambulatory referral to Physical Medicine Rehab   Complete by:  As directed    1-2 weeks transitional care appt   Ambulatory referral to Physical Medicine Rehab    Complete by:  As directed    1-2 weeks transitional care appt/needs TT appt     Allergies as of 12/02/2018      Reactions   Penicillins Hives, Swelling, Other (See Comments)   Ankle swelling Has patient had a PCN reaction causing immediate rash, facial/tongue/throat swelling, SOB or lightheadedness with hypotension: Yes Has patient had a PCN reaction causing severe rash involving mucus membranes or skin necrosis: No Has patient had a PCN reaction that required hospitalization: No Has patient had a PCN reaction occurring within the last 10 years: No If all of the above answers are "NO", then may proceed with Cephalosporin use.      Medication List    STOP taking these medications   clopidogrel 75 MG tablet Commonly known as:  PLAVIX   ibuprofen 200 MG tablet Commonly known as:  ADVIL,MOTRIN   midodrine 5 MG tablet Commonly known as:  PROAMATINE     TAKE these medications   acetaminophen 325 MG tablet Commonly known as:  TYLENOL Take 650 mg by mouth every 6 (six) hours as needed (pain).   AURYXIA 1 GM 210 MG(Fe) tablet  Generic drug:  ferric citrate Take 630 mg by mouth 3 (three) times daily with meals.   FREESTYLE LIBRE 14 DAY SENSOR Misc 1 Cartridge by Does not apply route every 14 (fourteen) days.   glucose monitoring kit monitoring kit 1 each by Does not apply route 4 (four) times daily - after meals and at bedtime. 1 month Diabetic Testing Supplies for QAC-QHS accuchecks.Any brand OK   hydrocerin Crea Apply 1 application topically 2 (two) times daily. To right leg and   insulin NPH-regular Human (70-30) 100 UNIT/ML injection Inject 5-6 Units into the skin 2 (two) times daily with a meal. Sliding Scale - based on CBG   Insulin Syringe-Needle U-100 25G X 1" 1 ML Misc Any brand, for 4 times a day insulin SQ, 1 month supply.   LIPITOR 40 MG tablet Generic drug:  atorvastatin Take 40 mg by mouth at bedtime.   methocarbamol 750 MG tablet Commonly known as:   ROBAXIN Take 1 tablet (750 mg total) by mouth 4 (four) times daily. Notes to patient:  For muscle spasms--wean this over next few days as tolerated.    multivitamin with minerals tablet Take 1 tablet by mouth daily with supper.   mupirocin ointment 2 % Commonly known as:  BACTROBAN Apply 1 application topically daily as needed (rassh).   polyethylene glycol packet Commonly known as:  MIRALAX / GLYCOLAX Take 17 g by mouth daily. Notes to patient:  For constipation--can use as needed   primidone 50 MG tablet Commonly known as:  MYSOLINE Take 1 tablet (50 mg total) by mouth at bedtime.   senna-docusate 8.6-50 MG tablet Commonly known as:  Senokot-S Take 2 tablets by mouth 2 (two) times daily.   timolol 0.5 % ophthalmic solution Commonly known as:  TIMOPTIC Place 1 drop into the left eye 2 (two) times daily.   traMADol 50 MG tablet--Rx # 28 pills. Commonly known as:  ULTRAM Take 1 tablet (50 mg total) by mouth every 6 (six) hours as needed for severe pain.     ASK your doctor about these medications   vancomycin 1-5 GM/200ML-% Soln Commonly known as:  VANCOCIN Inject 200 mLs (1,000 mg total) into the vein every Monday, Wednesday, and Friday with hemodialysis for 1 day. End date 12/04/18 Ask about: Should I take this medication?      Follow-up Information    Meredith Staggers, MD Follow up.   Specialty:  Physical Medicine and Rehabilitation Why:  office will call you for follow up appointment Contact information: [address] [place] [address] [phone]        Dorna Leitz, MD. Call.   Specialty:  Orthopedic Surgery Why:  for follow up appointment Contact information: [place] [address] [phone]        [name], MD. Go on 12/12/2018.   Specialty:  Family Medicine Why:  @ 4:15 PM Contact information: [place] [address] [phone]           Signed: Bary Leriche 12/05/2018, 11:03  PM

## 2018-12-01 NOTE — Progress Notes (Signed)
[place] PHYSICAL MEDICINE & REHABILITATION PROGRESS NOTE  Subjective/Complaints: Up eating breakfast. No new complaints. Happy about going home tomorrow  ROS: Patient denies fever, rash, sore throat, blurred vision, nausea, vomiting, diarrhea, cough, shortness of breath or chest pain, joint or back pain, headache, or mood change.   Objective: Vital Signs: Blood pressure (!) 145/68, pulse 75, temperature 98.4 F (36.9 C), temperature source Oral, resp. rate 17, height 5\' 11"  (1.803 m), weight 93.3 kg, SpO2 98 %. No results found. Recent Labs    11/29/18 1245  WBC 10.3  HGB 8.4*  HCT 27.9*  PLT 437*   Recent Labs    11/29/18 1245  NA 132*  K 4.7  CL 90*  CO2 26  GLUCOSE 105*  BUN 56*  CREATININE 9.36*  CALCIUM 8.5*    Physical Exam: BP (!) 145/68 (BP Location: Right Arm)   Pulse 75   Temp 98.4 F (36.9 C) (Oral)   Resp 17   Ht 5\' 11"  (1.803 m)   Wt 93.3 kg   SpO2 98%   BMI 28.69 kg/m  Constitutional: No distress . Vital signs reviewed. HEENT: EOMI, oral membranes moist Neck: supple Cardiovascular: RRR without murmur. No JVD    Respiratory: CTA Bilaterally without wheezes or rales. Normal effort    GI: BS +, non-tender, non-distended  Musculoskeletal:    Right lower extremity amputation of digits  Neurological: He is alert and oriented Motor: RUE: 5/5 proximal to distal RLE: 4+/5 proximal to distal LUE: 4/5 proximal to distal LUE tremor LLE: HF 4/5,  KE 3/5 Sensation diminished to light touch left hand  -stable Skin:  Left stump incision CDI. Blisters broken on posterior flap, some streaking. No drainage, don't see any necrotic areas.  Open abrasion over left patella clean/pink,    Right plantar foot ulcer  with dressing C/D/I-stable Psychiatric: cooperative  Assessment/Plan: 1. Functional deficits secondary to left BKA as well as right foot digit amputations which require 3+ hours per day of interdisciplinary therapy in a comprehensive inpatient  rehab setting.  Physiatrist is providing close team supervision and 24 hour management of active medical problems listed below.  Physiatrist and rehab team continue to assess barriers to discharge/monitor patient progress toward functional and medical goals  Care Tool:  Bathing    Body parts bathed by patient: Right arm, Chest, Left arm, Abdomen, Front perineal area, Buttocks, Left upper leg, Right lower leg, Right upper leg, Face     Body parts n/a: Left lower leg   Bathing assist Assist Level: Supervision/Verbal cueing     Upper Body Dressing/Undressing Upper body dressing Upper body dressing/undressing activity did not occur (including orthotics): N/A What is the patient wearing?: Pull over shirt    Upper body assist Assist Level: Independent    Lower Body Dressing/Undressing Lower body dressing      What is the patient wearing?: Underwear/pull up, Pants     Lower body assist Assist for lower body dressing: Supervision/Verbal cueing     Toileting Toileting Toileting Activity did not occur (Clothing management and hygiene only): N/A (no void or bm)  Toileting assist Assist for toileting: Independent     Transfers Chair/bed transfer  Transfers assist  Chair/bed transfer activity did not occur: Safety/medical concerns  Chair/bed transfer assist level: Independent with assistive device     Locomotion Ambulation   Ambulation assist      Assist level: Supervision/Verbal cueing Assistive device: Walker-rolling Max distance: 20   Walk 10 feet activity   Assist  Assist level: Supervision/Verbal cueing Assistive device: Walker-rolling   Walk 50 feet activity   Assist Walk 50 feet with 2 turns activity did not occur: Safety/medical concerns         Walk 150 feet activity   Assist Walk 150 feet activity did not occur: Safety/medical concerns         Walk 10 feet on uneven surface  activity   Assist Walk 10 feet on uneven surfaces  activity did not occur: Safety/medical concerns         Wheelchair     Assist Will patient use wheelchair at discharge?: Yes Type of Wheelchair: Manual    Wheelchair assist level: Independent Max wheelchair distance: 150    Wheelchair 50 feet with 2 turns activity    Assist        Assist Level: Independent   Wheelchair 150 feet activity     Assist Wheelchair 150 feet activity did not occur: Safety/medical concerns   Assist Level: Independent      Medical Problem List and Plan: 1.  Deficits with mobility, transfers, endurance, self-care secondary to left BKA.  -Continue CIR therapies including PT, OT     -re-wrapped stump, provided ed.   -ELOS 12/7  -Patient to see Rehab MD/provider in the office for transitional care encounter in 1-2 weeks.  2.  DVT Prophylaxis/Anticoagulation: Pharmaceutical: Heparin 3. Pain Management:   Continue oxycodone as needed with tramadol in between.  Robaxin scheduled on 11/28, increased on 11/29  -controlled 4. Mood: LCSW to follow for evaluation and support 5. Neuropsych: This patient is capable of making decisions on his own behalf. 6. Skin/Wound Care: Routine pressure relief measures.  Ordered PRAFO for right foot.     -reviewed left stump dressings with patient again  -continue ACE with gradient wrap until blisters better 7. Fluids/Electrolytes/Nutrition: Strict I's and O's.  Carb modified/ renal diet with 1200 cc fluid restriction. 8.  T2DM: Monitor blood sugars AC at bedtime.  Continue Lantus and titrate as indicated for tighter control  Controlled 12/6 9.  ESRD: Continue hemodialysis MWF at end of day to help with tolerance of activity.  Metrodin on hold. 10.  Sepsis due to MRSA bacteremia: Vancomycin with hemodialysis  ---abx thru 12/9 11.  Anemia of chronic disease: Monitor H&H with hemodialysis for trend.  Labs with HD  12  Diabetic peripheral neuropathy with right plantar neuropathic ulcer: Monitor for now and  educated on pressure relief measures.  13.  Drug-induced constipation  Bowel regimen increased on 11/28  BM 12/1 14. Elevated blood pressure  Controlled 12/6 15. Leukocytosis  WBCs 11.8 on 11/26--->10/7 12/2  Afebrile     LOS: 9 days A FACE TO FACE EVALUATION WAS PERFORMED  Meredith Staggers 12/01/2018, 11:13 AM

## 2018-12-01 NOTE — Consult Note (Addendum)
   [facility] CM Inpatient Consult   12/01/2018  TREYVEN LAFAUCI Sr. 02/06/1951 [id number]    [facility] Care Management hospital liaison follow up.  Went to bedside to speak with Mr. Korver about [place] Management program engagement. He is agreeable and [facility] Care Management written consent obtained. [facility] folder provided.  Mr. Markos endorses that he lives with his wife. States he is having a Air traffic controller" ramp installed today at his house. However, it is only temporary. He is agreeable to [place] worker follow up for community resources related to ramp and follow up on SCAT application. Mr. Sleeth endorses that his wife has started SCAT application process.  Confirmed Mr. Teale's Primary Care Provider is Dr. Ronnald Ramp with [facility]. Dr. Ronnald Ramp is not a [facility] provider. However, Mr. Glace goes to [employer] which makes him eligible for [place] Management services.   Confirmed best contact number for Mr. Lavell as [phone].  Mr. Bendorf is s/p left BKA. He has a history of HTN, DM, ESRD, HD on M,W,F.  Will make referral to [place] and [facility] social worker.   Made inpatient rehab LCSW aware [facility] to follow post discharge.    Marthenia Rolling, MSN-Ed, RN,BSN [facility] Liaison [phone]

## 2018-12-01 NOTE — Progress Notes (Signed)
Occupational Therapy Discharge Summary  Patient Details  Name: [name] BEEHLER Sr. MRN: [id number] Date of Birth: December 14, 1951  Patient has met 10 of 10 long term goals due to improved activity tolerance, improved balance, postural control and ability to compensate for deficits.  Patient to discharge at overall Mod I/ Supervision level.  Patient's care partner is independent to provide the necessary physical assistance at discharge for higher levl iADL tasks.   Reasons goals not met: n/a  Recommendation:  Patient will benefit from ongoing skilled OT services in home health setting to continue to advance functional skills in the area of BADL.  Equipment: tub transfer bench, 3-in-1 BSC, RW, wheelchair  Reasons for discharge: treatment goals met and discharge from hospital  Patient/family agrees with progress made and goals achieved: Yes  OT Discharge Precautions/Restrictions  Precautions Precautions: Fall Precaution Comments: L BKA Required Braces or Orthoses: (No limb protector in room) Restrictions Weight Bearing Restrictions: No LLE Weight Bearing: Non weight bearing Pain Pain Assessment Faces Pain Scale: No hurt ADL ADL Eating: Independent Grooming: Independent Lower Body Bathing: Supervision/safety Upper Body Dressing: Independent Lower Body Dressing: Independent Toileting: Independent Toilet Transfer: Modified independent Toilet Transfer Method: Squat pivot(supervision ambulating) Tub/Shower Transfer: Distant supervision Tub/Shower Transfer Method: Squat pivot(supervision ambulating) Perception  Perception: Within Functional Limits Praxis Praxis: Intact Cognition Overall Cognitive Status: Within Functional Limits for tasks assessed Arousal/Alertness: Awake/alert Orientation Level: (Simultaneous filing. User may not have seen previous data.) Safety/Judgment: Appears intact Sensation Sensation Central sensation comments: Nueropothy in R LE Proprioception:  Appears Intact Coordination Gross Motor Movements are Fluid and Coordinated: Yes Fine Motor Movements are Fluid and Coordinated: Yes Motor  Motor Motor - Skilled Clinical Observations: generalized weakness Motor - Discharge Observations: improved strength since eval Mobility  Bed Mobility Bed Mobility: Supine to Sit Supine to Sit: Independent Transfers Sit to Stand: Supervision/Verbal cueing  Trunk/Postural Assessment  Cervical Assessment Cervical Assessment: Within Functional Limits Thoracic Assessment Thoracic Assessment: Within Functional Limits Lumbar Assessment Lumbar Assessment: Within Functional Limits Postural Control Postural Control: Within Functional Limits  Balance Balance Balance Assessed: Yes Static Standing Balance Static Standing - Balance Support: Bilateral upper extremity supported Static Standing - Level of Assistance: 5: Stand by assistance Dynamic Standing Balance Dynamic Standing - Balance Support: During functional activity Dynamic Standing - Level of Assistance: 5: Stand by assistance Extremity/Trunk Assessment RUE Assessment RUE Assessment: Within Functional Limits LUE Assessment LUE Assessment: Within Functional Limits   Daneen Schick Nick Armel 12/01/2018, 3:03 PM

## 2018-12-01 NOTE — Progress Notes (Signed)
Occupational Therapy Session Note  Patient Details  Name: [name] HOHMANN Sr. MRN: [id number] Date of Birth: 25-Dec-1951  Today's Date: 12/01/2018 OT Individual Time: 1000-1030 OT Individual Time Calculation (min): 30 min    Short Term Goals: Week 1:  OT Short Term Goal 1 (Week 1): LTG=STG 2/2 ELOS  Skilled Therapeutic Interventions/Progress Updates:    1:1 Focus on setup and performance of transfers - squat pivot. Pt able to perform w/c setup and transfer mod I to/ from mat, bed, and couch. Pt also demonstrated correct technique   of wrapping residual limb. Encouraged to wrap over the knee to continue to promote knee extension.   Therapy Documentation Precautions:  Precautions Precautions: Fall Precaution Comments: L BKA Required Braces or Orthoses: (No limb protector in room) Restrictions Weight Bearing Restrictions: No LLE Weight Bearing: Non weight bearing Pain: No pain in session   Therapy/Group: Individual Therapy  Willeen Cass [facility] 12/01/2018, 12:13 PM

## 2018-12-01 NOTE — Procedures (Signed)
Patient seen on Hemodialysis. BP (!) 145/68 (BP Location: Right Arm)   Pulse 75   Temp 98.4 F (36.9 C) (Oral)   Resp 17   Ht 5\' 11"  (1.803 m)   Wt 93.3 kg   SpO2 98%   BMI 28.69 kg/m   QB 400, UF goal 1.5L Tolerating treatment without complaints at this time.   Elmarie Shiley MD [facility]. Office # [phone] Pager # [phone] 1:58 PM

## 2018-12-01 NOTE — Progress Notes (Signed)
Occupational Therapy Session Note  Patient Details  Name: [name] LEDIN Sr. MRN: [id number] Date of Birth: [birth year]  Today's Date: 12/01/2018 OT Individual Time: 9476-5465 OT Individual Time Calculation (min): 57 min   Short Term Goals: Week 1:  OT Short Term Goal 1 (Week 1): LTG=STG 2/2 ELOS  Skilled Therapeutic Interventions/Progress Updates:    Pt greeted asleep in bed upon OT arrival, easy to wake and ready for OT. Pt had not eaten breakfast but requested to eat after showering. OT applied water proof dressing to L residual limb and IV site. Pt then ambulated short distance from doorway of bathroom to tub bench using RW and close supervision-simulated for home environment. Bathing completed with set-up A and leaning method to wash buttocks. Dressing completed sit<>stand from wc level with overall Mod I and supervision for standing balance when pulling up pants. Pt left seated in wc at end of session with breakfast tray.   Therapy Documentation Precautions:  Precautions Precautions: Fall Precaution Comments: L BKA Required Braces or Orthoses: (No limb protector in room) Restrictions Weight Bearing Restrictions: No LLE Weight Bearing: Non weight bearing Pain: Pain Assessment Pain Scale: 0-10 Pain Score: 2  Pain Type: Acute pain Pain Location: Leg Pain Orientation: Left Pain Descriptors / Indicators: Aching Pain Frequency: Intermittent Pain Onset: On-going Patients Stated Pain Goal: 0 Pain Intervention(s): Repositioned  Therapy/Group: Individual Therapy  Valma Cava 12/01/2018, 8:06 AM

## 2018-12-01 NOTE — Plan of Care (Signed)
  Problem: Consults Goal: RH LIMB LOSS PATIENT EDUCATION Description Description: See Patient Education module for eduction specifics. 12/01/2018 1520 by Ellison Carwin A, LPN Outcome: Progressing 12/01/2018 1518 by Ellison Carwin A, LPN Outcome: Progressing Goal: Skin Care Protocol Initiated - if Braden Score 18 or less Description If consults are not indicated, leave blank or document N/A 12/01/2018 1520 by Ellison Carwin A, LPN Outcome: Progressing 12/01/2018 1518 by Ellison Carwin A, LPN Outcome: Progressing Goal: Nutrition Consult-if indicated 12/01/2018 1520 by Ellison Carwin A, LPN Outcome: Progressing 12/01/2018 1518 by Ellison Carwin A, LPN Outcome: Progressing   Problem: RH BOWEL ELIMINATION Goal: RH STG MANAGE BOWEL WITH ASSISTANCE Description STG Manage Bowel with min  Assistance.  12/01/2018 1520 by Adria Devon, LPN Outcome: Progressing 12/01/2018 1518 by Ellison Carwin A, LPN Outcome: Progressing   Problem: RH SKIN INTEGRITY Goal: RH STG SKIN FREE OF INFECTION/BREAKDOWN Description Patient will have infection managed with antibiotics at discharge.  12/01/2018 1520 by Adria Devon, LPN Outcome: Progressing 12/01/2018 1518 by Adria Devon, LPN Outcome: Progressing Goal: RH STG MAINTAIN SKIN INTEGRITY WITH ASSISTANCE Description STG Maintain Skin Integrity With min Assistance.  12/01/2018 1520 by Adria Devon, LPN Outcome: Progressing 12/01/2018 1518 by Adria Devon, LPN Outcome: Progressing Goal: RH STG ABLE TO PERFORM INCISION/WOUND CARE W/ASSISTANCE Description STG Able To Perform Incision/Wound Care With mod Assistance.  12/01/2018 1520 by Adria Devon, LPN Outcome: Progressing 12/01/2018 1518 by Adria Devon, LPN Outcome: Progressing   Problem: RH SAFETY Goal: RH STG ADHERE TO SAFETY PRECAUTIONS W/ASSISTANCE/DEVICE Description STG Adhere to Safety Precautions With  Min Assistance/Device.  12/01/2018  1520 by Ellison Carwin A, LPN Outcome: Progressing 12/01/2018 1518 by Ellison Carwin A, LPN Outcome: Progressing   Problem: RH PAIN MANAGEMENT Goal: RH STG PAIN MANAGED AT OR BELOW PT'S PAIN GOAL Description Less than 4 on 0-10 scale  12/01/2018 1520 by Ellison Carwin A, LPN Outcome: Progressing 12/01/2018 1518 by Ellison Carwin A, LPN Outcome: Progressing   Problem: RH KNOWLEDGE DEFICIT LIMB LOSS Goal: RH STG INCREASE KNOWLEDGE OF SELF CARE AFTER LIMB LOSS Description Patient will be able to verbalize how to manage care for self at discharge.  12/01/2018 1520 by Adria Devon, LPN Outcome: Progressing 12/01/2018 1518 by Adria Devon, LPN Outcome: Progressing

## 2018-12-01 NOTE — Progress Notes (Signed)
Occupational Therapy Session Note  Patient Details  Name: [name]. MRN: [id number] Date of Birth: [birth year]  Today's Date: 12/01/2018 OT Individual Time: 0832-0900 OT Individual Time Calculation (min): 28 min    Short Term Goals: Week 1:  OT Short Term Goal 1 (Week 1): LTG=STG 2/2 ELOS  Skilled Therapeutic Interventions/Progress Updates:    Treatment session with focus on education regarding care for residual limb.  Reiterated use of inspection mirror to inspect residual limb.  MD arrived during session and unwrapped leg, allowing pt to utilize mirror for further practice with inspection.  Pt asking questions about wound healing and timeline for prosthesis. Educated on residual limb wrapping and importance of knee extension at rest.    Therapy Documentation Precautions:  Precautions Precautions: Fall Precaution Comments: L BKA Required Braces or Orthoses: (No limb protector in room) Restrictions Weight Bearing Restrictions: No LLE Weight Bearing: Non weight bearing Pain: Pain Assessment Pain Scale: 0-10 Pain Score: 1  Pain Type: Acute pain Pain Location: Leg Pain Orientation: Left Pain Descriptors / Indicators: Aching Pain Frequency: Intermittent Pain Onset: Other (Comment)(wound care to left bka ) Patients Stated Pain Goal: 0 Pain Intervention(s): Medication (See eMAR);Distraction Multiple Pain Sites: No   Therapy/Group: Individual Therapy  Simonne Come 12/01/2018, 12:47 PM

## 2018-12-02 LAB — GLUCOSE, CAPILLARY: GLUCOSE-CAPILLARY: 125 mg/dL — AB (ref 70–99)

## 2018-12-02 NOTE — Progress Notes (Signed)
[facility] PHYSICAL MEDICINE & REHABILITATION PROGRESS NOTE  Subjective/Complaints: Some incisonal stump pain, no phantom pain, dressing not changed today  ROS: Patient deniesCP, SOB N/V/D   Objective: Vital Signs: Blood pressure (!) 151/78, pulse 82, temperature 98.4 F (36.9 C), resp. rate 17, height 5\' 11"  (1.803 m), weight 90.9 kg, SpO2 96 %. No results found. Recent Labs    11/29/18 1245  WBC 10.3  HGB 8.4*  HCT 27.9*  PLT 437*   Recent Labs    11/29/18 1245  NA 132*  K 4.7  CL 90*  CO2 26  GLUCOSE 105*  BUN 56*  CREATININE 9.36*  CALCIUM 8.5*    Physical Exam: BP (!) 151/78 (BP Location: Right Arm)   Pulse 82   Temp 98.4 F (36.9 C)   Resp 17   Ht 5\' 11"  (1.803 m)   Wt 90.9 kg   SpO2 96%   BMI 27.95 kg/m  Constitutional: No distress . Vital signs reviewed. HEENT: EOMI, oral membranes moist Neck: supple   Left stump incision CDI. Blisters broken on posterior flap, some streaking. No drainage, don't see any necrotic areas.       Psychiatric: cooperative  Assessment/Plan: 1. Functional deficits secondary to left BKA  Stable for D/C today F/u PCP in 3-4 weeks F/u PM&R 2 weeks See ortho Dr [name] 1 wk, pt will call See D/C summary See D/C instructions Care Tool:  Bathing    Body parts bathed by patient: Right arm, Chest, Left arm, Abdomen, Front perineal area, Buttocks, Left upper leg, Right lower leg, Right upper leg, Face     Body parts n/a: Left lower leg   Bathing assist Assist Level: Supervision/Verbal cueing     Upper Body Dressing/Undressing Upper body dressing Upper body dressing/undressing activity did not occur (including orthotics): N/A What is the patient wearing?: Pull over shirt    Upper body assist Assist Level: Independent    Lower Body Dressing/Undressing Lower body dressing      What is the patient wearing?: Underwear/pull up, Pants     Lower body assist Assist for lower body dressing: Supervision/Verbal  cueing     Toileting Toileting Toileting Activity did not occur (Clothing management and hygiene only): N/A (no void or bm)  Toileting assist Assist for toileting: Independent     Transfers Chair/bed transfer  Transfers assist  Chair/bed transfer activity did not occur: Safety/medical concerns  Chair/bed transfer assist level: Independent with assistive device     Locomotion Ambulation   Ambulation assist      Assist level: Supervision/Verbal cueing Assistive device: Walker-rolling Max distance: 25   Walk 10 feet activity   Assist     Assist level: Supervision/Verbal cueing Assistive device: Walker-rolling   Walk 50 feet activity   Assist Walk 50 feet with 2 turns activity did not occur: Safety/medical concerns         Walk 150 feet activity   Assist Walk 150 feet activity did not occur: Safety/medical concerns         Walk 10 feet on uneven surface  activity   Assist Walk 10 feet on uneven surfaces activity did not occur: Safety/medical concerns         Wheelchair     Assist Will patient use wheelchair at discharge?: Yes Type of Wheelchair: Manual    Wheelchair assist level: Independent Max wheelchair distance: 150    Wheelchair 50 feet with 2 turns activity    Assist        Assist  Level: Independent   Wheelchair 150 feet activity     Assist Wheelchair 150 feet activity did not occur: Safety/medical concerns   Assist Level: Independent      Medical Problem List and Plan: 1.  Deficits with mobility, transfers, endurance, self-care secondary to left BKA.  -Continue CIR therapies including PT, OT     -re-wrapped stump, provided ed.   May d/c today   -Patient to see Rehab MD/provider in the office for transitional care encounter in 1-2 weeks.  2.  DVT Prophylaxis/Anticoagulation: Pharmaceutical: Heparin 3. Pain Management:   Continue oxycodone as needed with tramadol in between.  Robaxin scheduled on 11/28,  increased on 11/29  -controlled 4. Mood: LCSW to follow for evaluation and support 5. Neuropsych: This patient is capable of making decisions on his own behalf. 6. Skin/Wound Care: Routine pressure relief measures.  Ordered PRAFO for right foot.     -reviewed left stump dressings with patient again  -continue ACE with gradient wrap until blisters better 7. Fluids/Electrolytes/Nutrition: Strict I's and O's.  Carb modified/ renal diet with 1200 cc fluid restriction. 8.  T2DM: Monitor blood sugars AC at bedtime.  Continue Lantus and titrate as indicated for tighter control  Controlled 12/6 9.  ESRD: Continue hemodialysis MWF at end of day to help with tolerance of activity.  Metrodin on hold. 10.  Sepsis due to MRSA bacteremia: Vancomycin with hemodialysis  ---abx thru 12/9 11.  Anemia of chronic disease: Monitor H&H with hemodialysis for trend.  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  13.  Drug-induced constipation  Bowel regimen increased on 11/28  BM 12/1 14. Elevated blood pressure  Controlled 12/7 15. Leukocytosis-improved   WBCs 11.8 on 11/26--->10.3 12/2  Afebrile     LOS: 10 days A FACE TO FACE EVALUATION WAS PERFORMED  Luanna Salk  12/02/2018, 10:09 AM

## 2018-12-02 NOTE — Progress Notes (Signed)
Pt d/c home, teaching for limb wrap

## 2018-12-02 NOTE — Progress Notes (Signed)
Pt and wife at bedside. Provided d/c instructions. Informed of medications, dressing changes, and follow up appts. Also informed of cost associated with Libre insulin device. Pt informed that has insurance to cover.  Informed pt that PA confirmed would not cover but if has another method of payment that is fine. Wife and pt verbalized an understanding of all information provided. Pt d/c home

## 2018-12-02 NOTE — Plan of Care (Signed)
Pt d/c home with family.

## 2018-12-04 ENCOUNTER — Other Ambulatory Visit: Payer: Self-pay

## 2018-12-04 DIAGNOSIS — N2581 Secondary hyperparathyroidism of renal origin: Secondary | ICD-10-CM | POA: Diagnosis not present

## 2018-12-04 DIAGNOSIS — M869 Osteomyelitis, unspecified: Secondary | ICD-10-CM | POA: Diagnosis not present

## 2018-12-04 DIAGNOSIS — M86172 Other acute osteomyelitis, left ankle and foot: Secondary | ICD-10-CM | POA: Diagnosis not present

## 2018-12-04 DIAGNOSIS — M868X7 Other osteomyelitis, ankle and foot: Secondary | ICD-10-CM | POA: Diagnosis not present

## 2018-12-04 DIAGNOSIS — D631 Anemia in chronic kidney disease: Secondary | ICD-10-CM | POA: Diagnosis not present

## 2018-12-04 DIAGNOSIS — Z992 Dependence on renal dialysis: Secondary | ICD-10-CM | POA: Diagnosis not present

## 2018-12-04 DIAGNOSIS — E1121 Type 2 diabetes mellitus with diabetic nephropathy: Secondary | ICD-10-CM | POA: Diagnosis not present

## 2018-12-04 DIAGNOSIS — N186 End stage renal disease: Secondary | ICD-10-CM | POA: Diagnosis not present

## 2018-12-04 DIAGNOSIS — D509 Iron deficiency anemia, unspecified: Secondary | ICD-10-CM | POA: Diagnosis not present

## 2018-12-04 NOTE — Patient Outreach (Signed)
[facility]) Care Management  12/04/2018  JAEVIAN SHEAN Sr. February 02, 1951 [id number]  Return call from the patient to this BSW, HIPAA identifiers confirmed. BSW introduced self to the patient and discussed the patients recent referral to [place] work for [employer] needs including a wheelchair ramp and transportation. The patient is unable to recall conversation with [facility] hospital liaison while inpatient last week. The patient acknowledges the above community resource needs but denies the need for BSW involvement.  The patient indicates he was contacted earlier today by "Becky at the [facility] in [place]". The patient reports this church has a Therapist, sports which builds ramps for [employer]. The patient is able to confirm he will receive a ramp from this community resource. The patient also denies the need for BSW to assist with a SCAT application as his social worker at the Dialysis center is assisting with this need.  BSW to perform a discipline closure as the patient denies the need for this BSW's involvement.  Daneen Schick, BSW, CDP [facility] [phone]

## 2018-12-04 NOTE — Discharge Instructions (Signed)
Inpatient Rehab Discharge Instructions  SHIV SHUEY Sr. Discharge date and time: 12/02/18   Activities/Precautions/ Functional Status: Activity: no lifting, driving, or strenuous exercise for till cleared by MD Diet: diabetic diet and renal diet Wound Care: Wash with soap and water. Pat dry--keep clean and dry. Contact MD if you develop any problems with your incision/wound--redness, swelling, increase in pain, drainage or if you develop fever or chills.   Functional status:  ___ No restrictions     ___ Walk up steps independently _X__ 24/7 supervision/assistance   ___ Walk up steps with assistance ___ Intermittent supervision/assistance  ___ Bathe/dress independently ___ Walk with walker     ___ Bathe/dress with assistance ___ Walk Independently    ___ Shower independently ___ Walk with assistance    _X__ Shower with assistance _X__ No alcohol     ___ Return to work/school ________   COMMUNITY REFERRALS UPON DISCHARGE:   Home Health:   PT     OT     RN    Agency:  [facility] Phone:  [phone] Medical Equipment/Items Ordered:  20"x18" lightweight wheelchair with left amputee support pad with basic cushion; tub transfer bench  Agency/Supplier:  [employer]       Phone:  [phone]  GENERAL COMMUNITY RESOURCES FOR PATIENT/FAMILY: Support Groups:  Amputee Support Group of [place]                              Meets every second Thursday of each month at 7-8:30pm                              [facility] [place] Conference Room                              For information, call [phone]   Special Instructions: 1. Check blood sugars before meals and at bedtime. Follow up with primary care for further adjustment    My questions have been answered and I understand these instructions. I will adhere to these goals and the provided educational materials after my discharge from the  hospital.  Patient/Caregiver Signature _______________________________ Date __________  Clinician Signature _______________________________________ Date __________  Please bring this form and your medication list with you to all your follow-up doctor's appointments.

## 2018-12-04 NOTE — Patient Outreach (Signed)
[facility]) Care Management  12/04/2018  [name] MEIRING Sr. [birth year] [id number]  BSW attempted to contact the patient on today's date to conduct a community resource consult. Unfortunately, today's call was unsuccessful. BSW left the patient a HIPAA compliant voice message requesting a return call.   Plan: BSW will mail the patient an unsuccessful outreach letter. BSW will attempt the patient again within the next four business days.  Daneen Schick, BSW, CDP [facility] [phone]

## 2018-12-04 NOTE — Progress Notes (Signed)
Social Work Discharge Note  The overall goal for the admission was met for:   Discharge location: Yes - home with wife and dtr  Length of Stay: Yes - 10 days  Discharge activity level: Yes - modified independent/supervision  Home/community participation: Yes  Services provided included: MD, RD, PT, OT, RN, Pharmacy, Neuropsych and SW  Financial Services: Medicare and Private Insurance: [place]  Follow-up services arranged: Home Health: PT/OT/RN, DME: 20"x18" lightweight wheelchair with left amputee support pad and basic cushion; tub transfer bench and Patient/Family request agency HH: [facility], DME: [facility]  Comments (or additional information): Pt's family came for family education and feel prepared to take care of pt at home.  They acknowledge our recommendation for supervision and will try to work this out.  Pt has a loaner ramp from [employer] and CSW called multiple community agencies to build permanent ramp.  Church in [place] will build pt a ramp when they are able to schedule it.  CSW also set pt up with [facility] for resources.    Patient/Family verbalized understanding of follow-up arrangements: Yes  Individual responsible for coordination of the follow-up plan: pt, his wife, and his dtr  Confirmed correct DME delivered: Trey Sailors 12/04/2018    Luca Dyar, Silvestre Mesi

## 2018-12-05 ENCOUNTER — Other Ambulatory Visit: Payer: Self-pay

## 2018-12-05 DIAGNOSIS — H409 Unspecified glaucoma: Secondary | ICD-10-CM | POA: Diagnosis not present

## 2018-12-05 DIAGNOSIS — E1142 Type 2 diabetes mellitus with diabetic polyneuropathy: Secondary | ICD-10-CM | POA: Diagnosis not present

## 2018-12-05 DIAGNOSIS — E1151 Type 2 diabetes mellitus with diabetic peripheral angiopathy without gangrene: Secondary | ICD-10-CM | POA: Diagnosis not present

## 2018-12-05 DIAGNOSIS — Z720 Tobacco use: Secondary | ICD-10-CM | POA: Diagnosis not present

## 2018-12-05 DIAGNOSIS — E1122 Type 2 diabetes mellitus with diabetic chronic kidney disease: Secondary | ICD-10-CM | POA: Diagnosis not present

## 2018-12-05 DIAGNOSIS — N186 End stage renal disease: Secondary | ICD-10-CM | POA: Diagnosis not present

## 2018-12-05 DIAGNOSIS — Z89512 Acquired absence of left leg below knee: Secondary | ICD-10-CM | POA: Diagnosis not present

## 2018-12-05 DIAGNOSIS — Z992 Dependence on renal dialysis: Secondary | ICD-10-CM | POA: Diagnosis not present

## 2018-12-05 DIAGNOSIS — L03116 Cellulitis of left lower limb: Secondary | ICD-10-CM

## 2018-12-05 DIAGNOSIS — I429 Cardiomyopathy, unspecified: Secondary | ICD-10-CM | POA: Diagnosis not present

## 2018-12-05 DIAGNOSIS — L97411 Non-pressure chronic ulcer of right heel and midfoot limited to breakdown of skin: Secondary | ICD-10-CM | POA: Diagnosis not present

## 2018-12-05 DIAGNOSIS — E11621 Type 2 diabetes mellitus with foot ulcer: Secondary | ICD-10-CM | POA: Diagnosis not present

## 2018-12-05 DIAGNOSIS — D631 Anemia in chronic kidney disease: Secondary | ICD-10-CM | POA: Diagnosis not present

## 2018-12-05 DIAGNOSIS — E1136 Type 2 diabetes mellitus with diabetic cataract: Secondary | ICD-10-CM | POA: Diagnosis not present

## 2018-12-05 DIAGNOSIS — I120 Hypertensive chronic kidney disease with stage 5 chronic kidney disease or end stage renal disease: Secondary | ICD-10-CM | POA: Diagnosis not present

## 2018-12-05 DIAGNOSIS — Z4781 Encounter for orthopedic aftercare following surgical amputation: Secondary | ICD-10-CM | POA: Diagnosis not present

## 2018-12-05 NOTE — Patient Outreach (Addendum)
[facility]) Care Management  12/05/2018  [name] Sr. 1951-06-18 [id number]    Referral received from hospital liaison due to member recently admitted on 11-17-2018 and discharged from hospital on 11-22-2018 to Rehab. Discharged from [facility] on 12-02-2018. Member with high risk for readmission. Member with multiple co morbidities. 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  Called member's preferred number and HIPPA Compliance voice mail message left. Called member's home number and left message with member's wife [name] requesting for member to return call when available.  Unsuccessful Letter previously sent via SW.  Will follow up with member within 3-4 business days.  Benjamine Mola "ANN" Josiah Lobo, RN-BSN  [facility] Care Management  Community Care Management Coordinator  [phone] [email or web address]

## 2018-12-06 DIAGNOSIS — N2581 Secondary hyperparathyroidism of renal origin: Secondary | ICD-10-CM | POA: Diagnosis not present

## 2018-12-06 DIAGNOSIS — N186 End stage renal disease: Secondary | ICD-10-CM | POA: Diagnosis not present

## 2018-12-06 DIAGNOSIS — D509 Iron deficiency anemia, unspecified: Secondary | ICD-10-CM | POA: Diagnosis not present

## 2018-12-06 DIAGNOSIS — D631 Anemia in chronic kidney disease: Secondary | ICD-10-CM | POA: Diagnosis not present

## 2018-12-06 DIAGNOSIS — M869 Osteomyelitis, unspecified: Secondary | ICD-10-CM | POA: Diagnosis not present

## 2018-12-06 DIAGNOSIS — M868X7 Other osteomyelitis, ankle and foot: Secondary | ICD-10-CM | POA: Diagnosis not present

## 2018-12-06 DIAGNOSIS — M86172 Other acute osteomyelitis, left ankle and foot: Secondary | ICD-10-CM | POA: Diagnosis not present

## 2018-12-07 ENCOUNTER — Other Ambulatory Visit: Payer: Self-pay

## 2018-12-07 ENCOUNTER — Encounter (HOSPITAL_BASED_OUTPATIENT_CLINIC_OR_DEPARTMENT_OTHER): Payer: Medicare Other | Attending: Internal Medicine

## 2018-12-07 ENCOUNTER — Ambulatory Visit: Payer: Medicare Other

## 2018-12-07 DIAGNOSIS — Z89512 Acquired absence of left leg below knee: Secondary | ICD-10-CM | POA: Insufficient documentation

## 2018-12-07 DIAGNOSIS — E1140 Type 2 diabetes mellitus with diabetic neuropathy, unspecified: Secondary | ICD-10-CM | POA: Insufficient documentation

## 2018-12-07 DIAGNOSIS — N185 Chronic kidney disease, stage 5: Secondary | ICD-10-CM | POA: Diagnosis not present

## 2018-12-07 DIAGNOSIS — E1122 Type 2 diabetes mellitus with diabetic chronic kidney disease: Secondary | ICD-10-CM | POA: Diagnosis not present

## 2018-12-07 DIAGNOSIS — I120 Hypertensive chronic kidney disease with stage 5 chronic kidney disease or end stage renal disease: Secondary | ICD-10-CM | POA: Insufficient documentation

## 2018-12-07 DIAGNOSIS — Z9889 Other specified postprocedural states: Secondary | ICD-10-CM | POA: Diagnosis not present

## 2018-12-07 DIAGNOSIS — L97519 Non-pressure chronic ulcer of other part of right foot with unspecified severity: Secondary | ICD-10-CM | POA: Diagnosis not present

## 2018-12-07 DIAGNOSIS — Z8631 Personal history of diabetic foot ulcer: Secondary | ICD-10-CM | POA: Insufficient documentation

## 2018-12-07 DIAGNOSIS — Z09 Encounter for follow-up examination after completed treatment for conditions other than malignant neoplasm: Secondary | ICD-10-CM | POA: Diagnosis not present

## 2018-12-07 NOTE — Patient Outreach (Signed)
[facility]) Care Management  12/07/2018  EGOR FULLILOVE Sr. 15-Dec-1951 [id number]    Member returned call and left voicemail message.  Returned call to member today and HIPPA Compliance voice mail message left and then member returned call again.  Spoke to member on phone and benefits of [facility] were explained to member. Member verbalized acceptance with [facility] program. Member was able to provide answers to questions asked; however, stated that he was in process of preparing for another appointment, therefore, time was limited and TOC Assessment was not completed.   During TOC Assessment, member questioned CM reasoning for assessment questions and stated he has home health assistance and questioned the need for [facility] Program. Member requested further written information concerning [facility]. Member verbalized agreement with CM visiting at [place] on Monday approximately 1300. Member states HD times MWF 1200-1730. Will assess readiness for engagement at that time.  Benjamine Mola "ANN" Josiah Lobo, RN-BSN  [facility] Care Management  Community Care Management Coordinator  [phone] [email or web address]

## 2018-12-08 DIAGNOSIS — N186 End stage renal disease: Secondary | ICD-10-CM | POA: Diagnosis not present

## 2018-12-08 DIAGNOSIS — D509 Iron deficiency anemia, unspecified: Secondary | ICD-10-CM | POA: Diagnosis not present

## 2018-12-08 DIAGNOSIS — N2581 Secondary hyperparathyroidism of renal origin: Secondary | ICD-10-CM | POA: Diagnosis not present

## 2018-12-08 DIAGNOSIS — M868X7 Other osteomyelitis, ankle and foot: Secondary | ICD-10-CM | POA: Diagnosis not present

## 2018-12-08 DIAGNOSIS — M86172 Other acute osteomyelitis, left ankle and foot: Secondary | ICD-10-CM | POA: Diagnosis not present

## 2018-12-08 DIAGNOSIS — D631 Anemia in chronic kidney disease: Secondary | ICD-10-CM | POA: Diagnosis not present

## 2018-12-11 ENCOUNTER — Other Ambulatory Visit: Payer: Self-pay

## 2018-12-11 DIAGNOSIS — M868X7 Other osteomyelitis, ankle and foot: Secondary | ICD-10-CM | POA: Diagnosis not present

## 2018-12-11 DIAGNOSIS — M86172 Other acute osteomyelitis, left ankle and foot: Secondary | ICD-10-CM | POA: Diagnosis not present

## 2018-12-11 DIAGNOSIS — D631 Anemia in chronic kidney disease: Secondary | ICD-10-CM | POA: Diagnosis not present

## 2018-12-11 DIAGNOSIS — N2581 Secondary hyperparathyroidism of renal origin: Secondary | ICD-10-CM | POA: Diagnosis not present

## 2018-12-11 DIAGNOSIS — D509 Iron deficiency anemia, unspecified: Secondary | ICD-10-CM | POA: Diagnosis not present

## 2018-12-11 DIAGNOSIS — N186 End stage renal disease: Secondary | ICD-10-CM | POA: Diagnosis not present

## 2018-12-11 NOTE — Patient Outreach (Signed)
[facility]) Care Management  12/11/2018  [name] Sr. April 17, 1951 [id number]  Visited member at [place] today as scheduled with the goal of completing the Initial Assessment. Provided information to member concerning [facility] Management Program and member Benefits as requested via member.   After explanation of benefits, member denied need for [facility] Care Management. Member stated that he has transportation arranged via [facility]; therefore, no need for transportation assistance, has nurse visits via [place]; therefore, no need for [place], only takes 2 pills and Insulin; therefore, no need for Pharmacy assistance, A1C is 6.9 and states A1C has been less than 7 for many years, has spousal assistance with ADLs and IADLs.  Member continued to state that most of his hospital related admissions has been due to a wound that worsened, leading to osteomyelitis and resulted in amputations of toes and now BKA. Member again denied need for assistance.  Member encouraged to call back if he decides later that he would like to have [facility] Management assistance. Contact information and [facility] Care Management folder provided to member.  Will Close Case and will notify PCP of Case Closure.  Benjamine Mola "ANN" Josiah Lobo, RN-BSN  [facility] Care Management  Community Care Management Coordinator  [phone] [email or web address]

## 2018-12-11 NOTE — Progress Notes (Signed)
Patient: [name]  DOB: 1951/06/18 MRN: [id number] PCP: [name], MD  Referring Provider: HFU   Patient Active Problem List   Diagnosis Date Noted  . MRSA bacteremia 11/21/2018    Priority: High  . S/P BKA (below knee amputation), left ([place]) 01/27/2018    Priority: High  . S/P unilateral BKA (below knee amputation), left ([place])   . Muscle spasms of both lower extremities   . Diabetes mellitus type 2 in nonobese (HCC)   . Drug induced constipation   . Unilateral complete BKA, left, initial encounter ([place]) 11/22/2018  . Postoperative pain   . Type 2 diabetes mellitus with peripheral neuropathy (HCC)   . Anemia of chronic disease   . Tobacco abuse   . Post-operative pain   . Normal coronary arteries 10/31/2018  . Syncope, non cardiac 10/31/2018  . HLD (hyperlipidemia) 05/10/2018  . Onychomycosis of multiple toenails with type 1 diabetes mellitus ([place]) 01/27/2018  . Onychomycosis of multiple toenails with type 2 diabetes mellitus and peripheral angiopathy ([place]) 10/05/2017  . ESRD on hemodialysis ([place])   . Osteomyelitis of right foot ([place]) 02/03/2017  . Type 2 diabetes mellitus with left diabetic foot ulcer ([place]) 01/21/2017  . Peripheral arterial disease ([place]) 08/12/2015  . Paroxysmal supraventricular tachycardia ([place]) 01/30/2015  . Occult blood in stools 11/25/2014  . Acute systolic heart failure ([place]) 11/25/2014  . Congestive dilated cardiomyopathy ([place]) 11/24/2014  . Visual changes 11/23/2014  . Cardiomegaly 11/23/2014  . Benign essential HTN 11/23/2014  . 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 Anemia due to chronic blood loss 11/23/2014  . Anemia due to end stage renal disease ([place]) 11/22/2014  . Symptomatic anemia   . HTN (hypertension) 01/28/2012  . Hyponatremia 01/28/2012  . Normocytic anemia 01/28/2012  . Sinusitis 01/28/2012     Subjective:  CC:  Hospital follow up. Accompanied by his wife today.  No complaints. Pleased that his incision is healing well from L BKA and reports that R plantar ulcer has healed.   HPI:  [name] is a 67 y.o. male with pmhx significant for ESRD on HD (MWF) via AVF, diabetes, hypertension, diabetic foot ulcer. Recent hospitalization in November 2019 with MRSA bacteremia in the setting of osteomyelitis of the left foot requiring amputation. He received TEE intraop and negative findings for IE. He was treated with 14 days of vancomycin therapy with hemodialysis through 12/04/18. He has AVF in place and functioning well.   Since discharge home he has recovered from surgery very well. Also with offloading and NWB his right foot plantar ulcer has closed and wound care released him. Denies any fevers/chills/sweats. Feels back to normal.   Review of Systems  Constitutional: Negative for chills, fever, malaise/fatigue and weight loss.  HENT: Negative for sore throat.        No dental problems  Respiratory: Negative for cough and sputum production.   Cardiovascular: Negative for chest pain and leg swelling.  Gastrointestinal: Negative for abdominal pain, diarrhea and vomiting.  Genitourinary: Negative for dysuria and flank pain.  Musculoskeletal: Negative for joint pain, myalgias and neck pain.  Skin: Negative for rash.  Neurological: Negative for dizziness, tingling and headaches.  Psychiatric/Behavioral: Negative for depression and substance abuse. The patient is not nervous/anxious and does not have insomnia.     Past Medical History:  Diagnosis Date  . Cardiomyopathy ([place])   . Cataract    right eye  . Diabetes mellitus  type 2  . Diabetic foot ulcer ([place])   . Diabetic infection of right foot ([place])   . Diabetic peripheral neuropathy ([place])   . ESRD (end stage renal disease) ([place])    m-w-f [address]  . Glaucoma    left eye  . Headache   . HTN (hypertension)   . Osteomyelitis ([place])   . Peripheral vascular disease ([place])     critical limb ischemia    Outpatient Medications Prior to Visit  Medication Sig Dispense Refill  . acetaminophen (TYLENOL) 325 MG tablet Take 650 mg by mouth every 6 (six) hours as needed (pain).     Marland Kitchen atorvastatin (LIPITOR) 40 MG tablet Take 40 mg by mouth at bedtime.     . ferric citrate (AURYXIA) 1 GM 210 MG(Fe) tablet Take 630 mg by mouth 3 (three) times daily with meals.     . insulin NPH-regular Human (NOVOLIN 70/30) (70-30) 100 UNIT/ML injection Inject 5-6 Units into the skin 2 (two) times daily with a meal. Sliding Scale - based on CBG    . Insulin Syringe-Needle U-100 25G X 1" 1 ML MISC Any brand, for 4 times a day insulin SQ, 1 month supply. 30 each 0  . Multiple Vitamins-Minerals (MULTIVITAMIN WITH MINERALS) tablet Take 1 tablet by mouth daily with supper.     . mupirocin ointment (BACTROBAN) 2 % Apply 1 application topically daily as needed (rassh).   0  . primidone (MYSOLINE) 50 MG tablet Take 1 tablet (50 mg total) by mouth at bedtime. 30 tablet 0  . timolol (TIMOPTIC) 0.5 % ophthalmic solution Place 1 drop into the left eye 2 (two) times daily.    . Continuous Blood Gluc Sensor (FREESTYLE LIBRE 14 DAY SENSOR) MISC 1 Cartridge by Does not apply route every 14 (fourteen) days. (Patient not taking: Reported on 12/07/2018) 6 each 0  . glucose monitoring kit (FREESTYLE) monitoring kit 1 each by Does not apply route 4 (four) times daily - after meals and at bedtime. 1 month Diabetic Testing Supplies for QAC-QHS accuchecks.Any brand OK (Patient not taking: Reported on 12/07/2018) 1 each 1  . hydrocerin (EUCERIN) CREA Apply 1 application topically 2 (two) times daily. To right leg and (Patient not taking: Reported on 12/12/2018) 454 g 0  . methocarbamol (ROBAXIN) 750 MG tablet Take 1 tablet (750 mg total) by mouth 4 (four) times daily. (Patient not taking: Reported on 12/07/2018) 120 tablet 0  . polyethylene glycol (MIRALAX / GLYCOLAX) packet Take 17 g by mouth daily. (Patient not  taking: Reported on 12/07/2018) 30 each 0  . senna-docusate (SENOKOT-S) 8.6-50 MG tablet Take 2 tablets by mouth 2 (two) times daily. (Patient not taking: Reported on 12/07/2018) 120 tablet 0  . traMADol (ULTRAM) 50 MG tablet Take 1 tablet (50 mg total) by mouth every 6 (six) hours as needed for severe pain. (Patient not taking: Reported on 12/07/2018) 28 tablet 0   No facility-administered medications prior to visit.      Allergies  Allergen Reactions  . Penicillins Hives, Swelling and Other (See Comments)    Ankle swelling Has patient had a PCN reaction causing immediate rash, facial/tongue/throat swelling, SOB or lightheadedness with hypotension: Yes Has patient had a PCN reaction causing severe rash involving mucus membranes or skin necrosis: No Has patient had a PCN reaction that required hospitalization: No Has patient had a PCN reaction occurring within the last 10 years: No If all of the above answers are "NO", then may proceed with Cephalosporin  use.    Social History   Tobacco Use  . Smoking status: Former Smoker    Packs/day: 0.50    Years: 4.00    Pack years: 2.00    Types: Cigarettes  . Smokeless tobacco: Never Used  . Tobacco comment: about 40 yrs ago  Substance Use Topics  . Alcohol use: No    Alcohol/week: 0.0 standard drinks  . Drug use: No    Family History  Problem Relation Age of Onset  . Heart disease Mother   . Diabetes Father   . Diabetes Brother   . Diabetes Sister   . Diabetes Brother   . Diabetes Brother     Objective:   Vitals:   12/12/18 1129  BP: 120/71  Pulse: 87  Temp: 98.2 F (36.8 C)  TempSrc: Oral  Weight: 199 lb 8.3 oz (90.5 kg)   Body mass index is 27.83 kg/m.  Physical Exam Constitutional:      Appearance: He is well-developed.     Comments: Seated comfortably in wheelchair during visit.   HENT:     Mouth/Throat:     Dentition: Normal dentition. No dental abscesses.  Eyes:     General: No scleral  icterus. Cardiovascular:     Rate and Rhythm: Normal rate and regular rhythm.     Heart sounds: Normal heart sounds.  Pulmonary:     Effort: Pulmonary effort is normal.     Breath sounds: Normal breath sounds.  Abdominal:     General: There is no distension.     Palpations: Abdomen is soft.     Tenderness: There is no abdominal tenderness.  Lymphadenopathy:     Cervical: No cervical adenopathy.  Skin:    General: Skin is warm and dry.     Findings: No rash.     Comments: L BKA with stump shrinker in place. Had follow up with Dr. Sharol Given and reports this is well healed.  R plantar wound @ MTP #2/3 healed over; large flaking skin and callous in place.   Neurological:     Mental Status: He is alert and oriented to person, place, and time.  Psychiatric:        Judgment: Judgment normal.     Comments: In good spirits today and engaged in care discussion.      Lab Results: Lab Results  Component Value Date   WBC 10.3 11/29/2018   HGB 8.4 (L) 11/29/2018   HCT 27.9 (L) 11/29/2018   MCV 91.8 11/29/2018   PLT 437 (H) 11/29/2018    Lab Results  Component Value Date   CREATININE 9.36 (H) 11/29/2018   BUN 56 (H) 11/29/2018   NA 132 (L) 11/29/2018   K 4.7 11/29/2018   CL 90 (L) 11/29/2018   CO2 26 11/29/2018    Lab Results  Component Value Date   ALT 37 11/17/2018   AST 49 (H) 11/17/2018   ALKPHOS 54 11/17/2018   BILITOT 0.9 11/17/2018     Assessment & Plan:   Problem List Items Addressed This Visit      High   S/P BKA (below knee amputation), left ([place])    Healing well. Definitive source control with high margins and was primary cause of his bacteremia recently. Continue care with orthopedic team.       MRSA bacteremia    He has completed 14 days of vancomycin following his HD sessions for bacteremia related to DFU/osteomyelitis of the L foot. Now with source control and no signs or  symptoms of relapsing bacteremia today. He looks very well and no complaints/symptoms  that would suggest seeding of previous infection. No further need for antibiotics at this time as he has had good cure.          Follow up with orthopedics and primary care/nephrology team. Encouraged him to continue with padding his R foot ulcer especially with new prosthetic as he will likely have altered pressure on the stable foot at first.   Janene Madeira, MSN, NP-C [facility] Pager: [phone] Office: [phone]  12/16/18  9:54 PM

## 2018-12-12 ENCOUNTER — Ambulatory Visit (INDEPENDENT_AMBULATORY_CARE_PROVIDER_SITE_OTHER): Payer: Medicare Other | Admitting: Infectious Diseases

## 2018-12-12 DIAGNOSIS — R7881 Bacteremia: Secondary | ICD-10-CM | POA: Diagnosis not present

## 2018-12-12 DIAGNOSIS — E1122 Type 2 diabetes mellitus with diabetic chronic kidney disease: Secondary | ICD-10-CM | POA: Diagnosis not present

## 2018-12-12 DIAGNOSIS — I10 Essential (primary) hypertension: Secondary | ICD-10-CM | POA: Diagnosis not present

## 2018-12-12 DIAGNOSIS — Z89512 Acquired absence of left leg below knee: Secondary | ICD-10-CM

## 2018-12-12 DIAGNOSIS — E119 Type 2 diabetes mellitus without complications: Secondary | ICD-10-CM | POA: Diagnosis not present

## 2018-12-12 DIAGNOSIS — Z4781 Encounter for orthopedic aftercare following surgical amputation: Secondary | ICD-10-CM | POA: Diagnosis not present

## 2018-12-12 DIAGNOSIS — G250 Essential tremor: Secondary | ICD-10-CM | POA: Diagnosis not present

## 2018-12-12 DIAGNOSIS — I120 Hypertensive chronic kidney disease with stage 5 chronic kidney disease or end stage renal disease: Secondary | ICD-10-CM | POA: Diagnosis not present

## 2018-12-12 DIAGNOSIS — E1142 Type 2 diabetes mellitus with diabetic polyneuropathy: Secondary | ICD-10-CM | POA: Diagnosis not present

## 2018-12-12 DIAGNOSIS — E11621 Type 2 diabetes mellitus with foot ulcer: Secondary | ICD-10-CM | POA: Diagnosis not present

## 2018-12-12 DIAGNOSIS — L97411 Non-pressure chronic ulcer of right heel and midfoot limited to breakdown of skin: Secondary | ICD-10-CM | POA: Diagnosis not present

## 2018-12-12 NOTE — Patient Instructions (Signed)
You look wonderful today and I am confident we cured your infection.   No need for further antibiotics at this time. I am so happy to hear your foot ulcer has healed over also.   Happy to see you back if you need and will send Dr. [name] your regards.

## 2018-12-13 ENCOUNTER — Inpatient Hospital Stay: Payer: Medicare Other | Admitting: Infectious Diseases

## 2018-12-13 DIAGNOSIS — D509 Iron deficiency anemia, unspecified: Secondary | ICD-10-CM | POA: Diagnosis not present

## 2018-12-13 DIAGNOSIS — N186 End stage renal disease: Secondary | ICD-10-CM | POA: Diagnosis not present

## 2018-12-13 DIAGNOSIS — M86172 Other acute osteomyelitis, left ankle and foot: Secondary | ICD-10-CM | POA: Diagnosis not present

## 2018-12-13 DIAGNOSIS — N2581 Secondary hyperparathyroidism of renal origin: Secondary | ICD-10-CM | POA: Diagnosis not present

## 2018-12-13 DIAGNOSIS — Z112 Encounter for screening for other bacterial diseases: Secondary | ICD-10-CM | POA: Diagnosis not present

## 2018-12-13 DIAGNOSIS — M868X7 Other osteomyelitis, ankle and foot: Secondary | ICD-10-CM | POA: Diagnosis not present

## 2018-12-13 DIAGNOSIS — D631 Anemia in chronic kidney disease: Secondary | ICD-10-CM | POA: Diagnosis not present

## 2018-12-14 DIAGNOSIS — L97411 Non-pressure chronic ulcer of right heel and midfoot limited to breakdown of skin: Secondary | ICD-10-CM | POA: Diagnosis not present

## 2018-12-14 DIAGNOSIS — E1142 Type 2 diabetes mellitus with diabetic polyneuropathy: Secondary | ICD-10-CM | POA: Diagnosis not present

## 2018-12-14 DIAGNOSIS — I120 Hypertensive chronic kidney disease with stage 5 chronic kidney disease or end stage renal disease: Secondary | ICD-10-CM | POA: Diagnosis not present

## 2018-12-14 DIAGNOSIS — Z9889 Other specified postprocedural states: Secondary | ICD-10-CM | POA: Diagnosis not present

## 2018-12-14 DIAGNOSIS — E1122 Type 2 diabetes mellitus with diabetic chronic kidney disease: Secondary | ICD-10-CM | POA: Diagnosis not present

## 2018-12-14 DIAGNOSIS — E11621 Type 2 diabetes mellitus with foot ulcer: Secondary | ICD-10-CM | POA: Diagnosis not present

## 2018-12-14 DIAGNOSIS — Z4781 Encounter for orthopedic aftercare following surgical amputation: Secondary | ICD-10-CM | POA: Diagnosis not present

## 2018-12-14 DIAGNOSIS — M79672 Pain in left foot: Secondary | ICD-10-CM | POA: Diagnosis not present

## 2018-12-15 DIAGNOSIS — D631 Anemia in chronic kidney disease: Secondary | ICD-10-CM | POA: Diagnosis not present

## 2018-12-15 DIAGNOSIS — N2581 Secondary hyperparathyroidism of renal origin: Secondary | ICD-10-CM | POA: Diagnosis not present

## 2018-12-15 DIAGNOSIS — N186 End stage renal disease: Secondary | ICD-10-CM | POA: Diagnosis not present

## 2018-12-15 DIAGNOSIS — M86172 Other acute osteomyelitis, left ankle and foot: Secondary | ICD-10-CM | POA: Diagnosis not present

## 2018-12-15 DIAGNOSIS — M868X7 Other osteomyelitis, ankle and foot: Secondary | ICD-10-CM | POA: Diagnosis not present

## 2018-12-15 DIAGNOSIS — D509 Iron deficiency anemia, unspecified: Secondary | ICD-10-CM | POA: Diagnosis not present

## 2018-12-16 NOTE — Assessment & Plan Note (Signed)
He has completed 14 days of vancomycin following his HD sessions for bacteremia related to DFU/osteomyelitis of the L foot. Now with source control and no signs or symptoms of relapsing bacteremia today. He looks very well and no complaints/symptoms that would suggest seeding of previous infection. No further need for antibiotics at this time as he has had good cure.

## 2018-12-16 NOTE — Assessment & Plan Note (Signed)
Healing well. Definitive source control with high margins and was primary cause of his bacteremia recently. Continue care with orthopedic team.

## 2018-12-17 DIAGNOSIS — N186 End stage renal disease: Secondary | ICD-10-CM | POA: Diagnosis not present

## 2018-12-17 DIAGNOSIS — M86172 Other acute osteomyelitis, left ankle and foot: Secondary | ICD-10-CM | POA: Diagnosis not present

## 2018-12-17 DIAGNOSIS — M868X7 Other osteomyelitis, ankle and foot: Secondary | ICD-10-CM | POA: Diagnosis not present

## 2018-12-17 DIAGNOSIS — D509 Iron deficiency anemia, unspecified: Secondary | ICD-10-CM | POA: Diagnosis not present

## 2018-12-17 DIAGNOSIS — D631 Anemia in chronic kidney disease: Secondary | ICD-10-CM | POA: Diagnosis not present

## 2018-12-17 DIAGNOSIS — N2581 Secondary hyperparathyroidism of renal origin: Secondary | ICD-10-CM | POA: Diagnosis not present

## 2018-12-18 ENCOUNTER — Telehealth: Payer: Self-pay | Admitting: *Deleted

## 2018-12-18 NOTE — Telephone Encounter (Signed)
[name] from [facility] called for 2wk4 POC.  Approval given.

## 2018-12-19 DIAGNOSIS — E1142 Type 2 diabetes mellitus with diabetic polyneuropathy: Secondary | ICD-10-CM | POA: Diagnosis not present

## 2018-12-19 DIAGNOSIS — I120 Hypertensive chronic kidney disease with stage 5 chronic kidney disease or end stage renal disease: Secondary | ICD-10-CM | POA: Diagnosis not present

## 2018-12-19 DIAGNOSIS — M86172 Other acute osteomyelitis, left ankle and foot: Secondary | ICD-10-CM | POA: Diagnosis not present

## 2018-12-19 DIAGNOSIS — L97411 Non-pressure chronic ulcer of right heel and midfoot limited to breakdown of skin: Secondary | ICD-10-CM | POA: Diagnosis not present

## 2018-12-19 DIAGNOSIS — Z4781 Encounter for orthopedic aftercare following surgical amputation: Secondary | ICD-10-CM | POA: Diagnosis not present

## 2018-12-19 DIAGNOSIS — N186 End stage renal disease: Secondary | ICD-10-CM | POA: Diagnosis not present

## 2018-12-19 DIAGNOSIS — E1122 Type 2 diabetes mellitus with diabetic chronic kidney disease: Secondary | ICD-10-CM | POA: Diagnosis not present

## 2018-12-19 DIAGNOSIS — D509 Iron deficiency anemia, unspecified: Secondary | ICD-10-CM | POA: Diagnosis not present

## 2018-12-19 DIAGNOSIS — N2581 Secondary hyperparathyroidism of renal origin: Secondary | ICD-10-CM | POA: Diagnosis not present

## 2018-12-19 DIAGNOSIS — M868X7 Other osteomyelitis, ankle and foot: Secondary | ICD-10-CM | POA: Diagnosis not present

## 2018-12-19 DIAGNOSIS — E11621 Type 2 diabetes mellitus with foot ulcer: Secondary | ICD-10-CM | POA: Diagnosis not present

## 2018-12-19 DIAGNOSIS — D631 Anemia in chronic kidney disease: Secondary | ICD-10-CM | POA: Diagnosis not present

## 2018-12-21 DIAGNOSIS — Z4781 Encounter for orthopedic aftercare following surgical amputation: Secondary | ICD-10-CM | POA: Diagnosis not present

## 2018-12-21 DIAGNOSIS — E1142 Type 2 diabetes mellitus with diabetic polyneuropathy: Secondary | ICD-10-CM | POA: Diagnosis not present

## 2018-12-21 DIAGNOSIS — I120 Hypertensive chronic kidney disease with stage 5 chronic kidney disease or end stage renal disease: Secondary | ICD-10-CM | POA: Diagnosis not present

## 2018-12-21 DIAGNOSIS — L97411 Non-pressure chronic ulcer of right heel and midfoot limited to breakdown of skin: Secondary | ICD-10-CM | POA: Diagnosis not present

## 2018-12-21 DIAGNOSIS — E11621 Type 2 diabetes mellitus with foot ulcer: Secondary | ICD-10-CM | POA: Diagnosis not present

## 2018-12-21 DIAGNOSIS — E1122 Type 2 diabetes mellitus with diabetic chronic kidney disease: Secondary | ICD-10-CM | POA: Diagnosis not present

## 2018-12-22 DIAGNOSIS — N186 End stage renal disease: Secondary | ICD-10-CM | POA: Diagnosis not present

## 2018-12-22 DIAGNOSIS — D631 Anemia in chronic kidney disease: Secondary | ICD-10-CM | POA: Diagnosis not present

## 2018-12-22 DIAGNOSIS — M868X7 Other osteomyelitis, ankle and foot: Secondary | ICD-10-CM | POA: Diagnosis not present

## 2018-12-22 DIAGNOSIS — D509 Iron deficiency anemia, unspecified: Secondary | ICD-10-CM | POA: Diagnosis not present

## 2018-12-22 DIAGNOSIS — M86172 Other acute osteomyelitis, left ankle and foot: Secondary | ICD-10-CM | POA: Diagnosis not present

## 2018-12-22 DIAGNOSIS — N2581 Secondary hyperparathyroidism of renal origin: Secondary | ICD-10-CM | POA: Diagnosis not present

## 2018-12-24 DIAGNOSIS — N186 End stage renal disease: Secondary | ICD-10-CM | POA: Diagnosis not present

## 2018-12-24 DIAGNOSIS — D631 Anemia in chronic kidney disease: Secondary | ICD-10-CM | POA: Diagnosis not present

## 2018-12-24 DIAGNOSIS — M86172 Other acute osteomyelitis, left ankle and foot: Secondary | ICD-10-CM | POA: Diagnosis not present

## 2018-12-24 DIAGNOSIS — D509 Iron deficiency anemia, unspecified: Secondary | ICD-10-CM | POA: Diagnosis not present

## 2018-12-24 DIAGNOSIS — N2581 Secondary hyperparathyroidism of renal origin: Secondary | ICD-10-CM | POA: Diagnosis not present

## 2018-12-24 DIAGNOSIS — M868X7 Other osteomyelitis, ankle and foot: Secondary | ICD-10-CM | POA: Diagnosis not present

## 2018-12-25 DIAGNOSIS — Z4781 Encounter for orthopedic aftercare following surgical amputation: Secondary | ICD-10-CM | POA: Diagnosis not present

## 2018-12-25 DIAGNOSIS — E1122 Type 2 diabetes mellitus with diabetic chronic kidney disease: Secondary | ICD-10-CM | POA: Diagnosis not present

## 2018-12-25 DIAGNOSIS — L97411 Non-pressure chronic ulcer of right heel and midfoot limited to breakdown of skin: Secondary | ICD-10-CM | POA: Diagnosis not present

## 2018-12-25 DIAGNOSIS — E11621 Type 2 diabetes mellitus with foot ulcer: Secondary | ICD-10-CM | POA: Diagnosis not present

## 2018-12-25 DIAGNOSIS — I120 Hypertensive chronic kidney disease with stage 5 chronic kidney disease or end stage renal disease: Secondary | ICD-10-CM | POA: Diagnosis not present

## 2018-12-25 DIAGNOSIS — E1142 Type 2 diabetes mellitus with diabetic polyneuropathy: Secondary | ICD-10-CM | POA: Diagnosis not present

## 2018-12-26 ENCOUNTER — Telehealth: Payer: Self-pay

## 2018-12-26 DIAGNOSIS — M86172 Other acute osteomyelitis, left ankle and foot: Secondary | ICD-10-CM | POA: Diagnosis not present

## 2018-12-26 DIAGNOSIS — N2581 Secondary hyperparathyroidism of renal origin: Secondary | ICD-10-CM | POA: Diagnosis not present

## 2018-12-26 DIAGNOSIS — D631 Anemia in chronic kidney disease: Secondary | ICD-10-CM | POA: Diagnosis not present

## 2018-12-26 DIAGNOSIS — D509 Iron deficiency anemia, unspecified: Secondary | ICD-10-CM | POA: Diagnosis not present

## 2018-12-26 DIAGNOSIS — M868X7 Other osteomyelitis, ankle and foot: Secondary | ICD-10-CM | POA: Diagnosis not present

## 2018-12-26 DIAGNOSIS — N186 End stage renal disease: Secondary | ICD-10-CM | POA: Diagnosis not present

## 2018-12-26 NOTE — Telephone Encounter (Signed)
Dwyane Luo called stating patient fell yesterday coming in from dialysis and opened up stub. It looks much better today.

## 2018-12-27 DIAGNOSIS — N186 End stage renal disease: Secondary | ICD-10-CM | POA: Diagnosis not present

## 2018-12-27 DIAGNOSIS — E1122 Type 2 diabetes mellitus with diabetic chronic kidney disease: Secondary | ICD-10-CM | POA: Diagnosis not present

## 2018-12-27 DIAGNOSIS — Z992 Dependence on renal dialysis: Secondary | ICD-10-CM | POA: Diagnosis not present

## 2018-12-28 DIAGNOSIS — E1122 Type 2 diabetes mellitus with diabetic chronic kidney disease: Secondary | ICD-10-CM | POA: Diagnosis not present

## 2018-12-28 DIAGNOSIS — Z4781 Encounter for orthopedic aftercare following surgical amputation: Secondary | ICD-10-CM | POA: Diagnosis not present

## 2018-12-28 DIAGNOSIS — L97411 Non-pressure chronic ulcer of right heel and midfoot limited to breakdown of skin: Secondary | ICD-10-CM | POA: Diagnosis not present

## 2018-12-28 DIAGNOSIS — E11621 Type 2 diabetes mellitus with foot ulcer: Secondary | ICD-10-CM | POA: Diagnosis not present

## 2018-12-28 DIAGNOSIS — I120 Hypertensive chronic kidney disease with stage 5 chronic kidney disease or end stage renal disease: Secondary | ICD-10-CM | POA: Diagnosis not present

## 2018-12-28 DIAGNOSIS — E1142 Type 2 diabetes mellitus with diabetic polyneuropathy: Secondary | ICD-10-CM | POA: Diagnosis not present

## 2018-12-29 DIAGNOSIS — D509 Iron deficiency anemia, unspecified: Secondary | ICD-10-CM | POA: Diagnosis not present

## 2018-12-29 DIAGNOSIS — N186 End stage renal disease: Secondary | ICD-10-CM | POA: Diagnosis not present

## 2018-12-29 DIAGNOSIS — D631 Anemia in chronic kidney disease: Secondary | ICD-10-CM | POA: Diagnosis not present

## 2018-12-29 DIAGNOSIS — N2581 Secondary hyperparathyroidism of renal origin: Secondary | ICD-10-CM | POA: Diagnosis not present

## 2018-12-29 DIAGNOSIS — E1121 Type 2 diabetes mellitus with diabetic nephropathy: Secondary | ICD-10-CM | POA: Diagnosis not present

## 2018-12-29 DIAGNOSIS — Z992 Dependence on renal dialysis: Secondary | ICD-10-CM | POA: Diagnosis not present

## 2018-12-31 ENCOUNTER — Encounter (HOSPITAL_COMMUNITY): Payer: Self-pay | Admitting: Emergency Medicine

## 2018-12-31 ENCOUNTER — Emergency Department (HOSPITAL_COMMUNITY)
Admission: EM | Admit: 2018-12-31 | Discharge: 2018-12-31 | Disposition: A | Discharge status: Home / Self Care | Payer: Medicare Other | Attending: Emergency Medicine | Admitting: Emergency Medicine

## 2018-12-31 DIAGNOSIS — E1122 Type 2 diabetes mellitus with diabetic chronic kidney disease: Secondary | ICD-10-CM | POA: Diagnosis not present

## 2018-12-31 DIAGNOSIS — Z89512 Acquired absence of left leg below knee: Secondary | ICD-10-CM | POA: Insufficient documentation

## 2018-12-31 DIAGNOSIS — I5022 Chronic systolic (congestive) heart failure: Secondary | ICD-10-CM | POA: Insufficient documentation

## 2018-12-31 DIAGNOSIS — E785 Hyperlipidemia, unspecified: Secondary | ICD-10-CM | POA: Insufficient documentation

## 2018-12-31 DIAGNOSIS — Z794 Long term (current) use of insulin: Secondary | ICD-10-CM | POA: Diagnosis not present

## 2018-12-31 DIAGNOSIS — I132 Hypertensive heart and chronic kidney disease with heart failure and with stage 5 chronic kidney disease, or end stage renal disease: Secondary | ICD-10-CM | POA: Insufficient documentation

## 2018-12-31 DIAGNOSIS — N186 End stage renal disease: Secondary | ICD-10-CM | POA: Insufficient documentation

## 2018-12-31 DIAGNOSIS — T8130XA Disruption of wound, unspecified, initial encounter: Secondary | ICD-10-CM

## 2018-12-31 DIAGNOSIS — Y829 Unspecified medical devices associated with adverse incidents: Secondary | ICD-10-CM | POA: Insufficient documentation

## 2018-12-31 DIAGNOSIS — T8781 Dehiscence of amputation stump: Secondary | ICD-10-CM | POA: Insufficient documentation

## 2018-12-31 DIAGNOSIS — Z992 Dependence on renal dialysis: Secondary | ICD-10-CM | POA: Insufficient documentation

## 2018-12-31 DIAGNOSIS — Z79899 Other long term (current) drug therapy: Secondary | ICD-10-CM | POA: Diagnosis not present

## 2018-12-31 DIAGNOSIS — Z87891 Personal history of nicotine dependence: Secondary | ICD-10-CM | POA: Insufficient documentation

## 2018-12-31 NOTE — ED Provider Notes (Signed)
[facility] EMERGENCY DEPARTMENT Provider Note   CSN: [id number] Arrival date & time: 12/31/18  1642     History   Chief Complaint No chief complaint on file.   HPI [name] CHENIER Sr. is a 68 y.o. male.  The history is provided by the patient and medical records. No language interpreter was used.   [name] Sages Sr. is a 68 y.o. male who presents to the Emergency Department complaining of wound to his stump on the left lower extremity.  Patient states that he had a mechanical fall yesterday and hit the bottom of his stump.  His surgical incision site opened up.  Yesterday, the opening was quite small.  They did dressing changes as usual and felt as if the bleeding was controlled.  This morning, the area was opened more than last night.  He felt as if the wound was even more so open this afternoon, prompting him to seek emergency care.  Denies any other injuries from the fall.  He did not hit his head or pass out.  No nausea or vomiting.  Other than dressing changes, no other medications/treatments prior to arrival for symptoms.  He follows with orthopedics, Dr. [name] and has an appointment coming up on Thursday.   Past Medical History:  Diagnosis Date  . Cardiomyopathy ([place])   . Cataract    right eye  . Diabetes mellitus    type 2  . Diabetic foot ulcer ([place])   . Diabetic infection of right foot ([place])   . Diabetic peripheral neuropathy ([place])   . ESRD (end stage renal disease) ([name])    m-w-f [facility] [place]  . Glaucoma    left eye  . Headache   . HTN (hypertension)   . Osteomyelitis ([place])   . Peripheral vascular disease ([place])    critical limb ischemia    Patient Active Problem List   Diagnosis Date Noted  . S/P unilateral BKA (below knee amputation), left ([place])   . Muscle spasms of both lower extremities   . Diabetes mellitus type 2 in nonobese (HCC)   . Drug induced constipation   . Unilateral complete BKA, left, initial  encounter ([place]) 11/22/2018  . Postoperative pain   . Type 2 diabetes mellitus with peripheral neuropathy (HCC)   . Anemia of chronic disease   . MRSA bacteremia 11/21/2018  . Tobacco abuse   . Post-operative pain   . Normal coronary arteries 10/31/2018  . Syncope, non cardiac 10/31/2018  . HLD (hyperlipidemia) 05/10/2018  . S/P BKA (below knee amputation), left ([place]) 01/27/2018  . Onychomycosis of multiple toenails with type 1 diabetes mellitus ([place]) 01/27/2018  . Onychomycosis of multiple toenails with type 2 diabetes mellitus and peripheral angiopathy ([place]) 10/05/2017  . ESRD on hemodialysis ([place])   . Osteomyelitis of right foot ([place]) 02/03/2017  . Type 2 diabetes mellitus with left diabetic foot ulcer ([place]) 01/21/2017  . Peripheral arterial disease ([place]) 08/12/2015  . Paroxysmal supraventricular tachycardia ([place]) 01/30/2015  . Occult blood in stools 11/25/2014  . Acute systolic heart failure ([place]) 11/25/2014  . Congestive dilated cardiomyopathy ([place]) 11/24/2014  . Visual changes 11/23/2014  . Cardiomegaly 11/23/2014  . Benign essential HTN 11/23/2014  . 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 Anemia due to chronic blood loss 11/23/2014  . Anemia due to end stage renal disease ([place]) 11/22/2014  . Symptomatic anemia   . HTN (hypertension) 01/28/2012  . Hyponatremia 01/28/2012  .  Normocytic anemia 01/28/2012  . Sinusitis 01/28/2012    Past Surgical History:  Procedure Laterality Date  . ABDOMINAL AORTOGRAM N/A 05/04/2018   Procedure: ABDOMINAL AORTOGRAM;  Surgeon: Conrad Beards [place], MD;  Location: [facility];  Service: Cardiovascular;  Laterality: N/A;  . AMPUTATION Left 11/20/2018   Procedure: AMPUTATION BELOW KNEE;  Surgeon: Dorna Leitz, MD;  Location: [facility];  Service: Orthopedics;  Laterality: Left;  . AMPUTATION TOE Right 01/21/2017   Procedure: AMPUTATION 2ND TOE RIGHT FOOT ;  Surgeon: Dorna Leitz, MD;  Location: [facility];   Service: Orthopedics;  Laterality: Right;  . AMPUTATION TOE Left 05/18/2017   Procedure: 5TH RAY AMPUTATION  and EXCISION CUBOID;  Surgeon: Dorna Leitz, MD;  Location: [facility];  Service: Orthopedics;  Laterality: Left;  . AMPUTATION TOE Bilateral 01/27/2018   Procedure: RIGHT FOOT 3RD TOE AMPUTATION WITH INCISIONAL WOUND DEBRIDEMENT. LEFT FOOT 2ND TOE AMPUTATION AND INCISIONAL WOUND DEBRIDEMENT.;  Surgeon: Dorna Leitz, MD;  Location: [facility];  Service: Orthopedics;  Laterality: Bilateral;  . AV FISTULA PLACEMENT Left 11/27/2014   Procedure: ARTERIOVENOUS (AV) FISTULA CREATION;  Surgeon: Conrad Montverde, MD;  Location: [facility];  Service: Vascular;  Laterality: Left;  . CHOLECYSTECTOMY    . COLONOSCOPY N/A 11/28/2014   Procedure: COLONOSCOPY;  Surgeon: Ladene Artist, MD;  Location: [facility] ENDOSCOPY;  Service: Endoscopy;  Laterality: N/A;  . ESOPHAGOGASTRODUODENOSCOPY N/A 11/28/2014   Procedure: ESOPHAGOGASTRODUODENOSCOPY (EGD);  Surgeon: Ladene Artist, MD;  Location[facility]OSCOPY;  Service: Endoscopy;  Laterality: N/A;  . EYE SURGERY     retina and cataract left eye surgery  . I&D EXTREMITY  01/28/2012   Procedure: IRRIGATION AND DEBRIDEMENT EXTREMITY;  Surgeon: Paulene Floor, MD;  Location: [facility];  Service: Orthopedics;  Laterality: Left;  irrigation and drainage left hand  . I&D EXTREMITY  01/30/2012   Procedure: IRRIGATION AND DEBRIDEMENT EXTREMITY;  Surgeon: Paulene Floor, MD;  Location: [facility];  Service: Orthopedics;  Laterality: Left;  flexor teno synovectomy and extensor tenosynovectomy arthrosynovectomy wristjoint  . I&D EXTREMITY  02/01/2012   Procedure: IRRIGATION AND DEBRIDEMENT EXTREMITY;  Surgeon: Paulene Floor, MD;  Location: [facility];  Service: Orthopedics;  Laterality: Left;  Left Wrist, Hand and Forearm  . IRRIGATION AND DEBRIDEMENT FOOT Left 01/21/2017   Procedure: DEBRIDEMENT LEFT PLANTAR WOUND;  Surgeon: Dorna Leitz, MD;  Location: [facility];  Service: Orthopedics;  Laterality:  Left;  . Left 4th toe amputation    . LEFT AND RIGHT HEART CATHETERIZATION WITH CORONARY ANGIOGRAM N/A 11/26/2014   Procedure: LEFT AND RIGHT HEART CATHETERIZATION WITH CORONARY ANGIOGRAM;  Surgeon: Leonie Man, MD;  Location: [facility] CATH LAB;  Service: Cardiovascular;  Laterality: N/A;  . LOWER EXTREMITY ANGIOGRAPHY Bilateral 05/04/2018   Procedure: Lower Extremity Angiography;  Surgeon: Conrad [place], MD;  Location: [facility];  Service: Cardiovascular;  Laterality: Bilateral;  . LOWER EXTREMITY ANGIOGRAPHY Left 05/11/2018   Procedure: LOWER EXTREMITY ANGIOGRAPHY;  Surgeon: Conrad Cobre, MD;  Location: [facility];  Service: Cardiovascular;  Laterality: Left;  . PERIPHERAL VASCULAR BALLOON ANGIOPLASTY Left 05/11/2018   Procedure: PERIPHERAL VASCULAR BALLOON ANGIOPLASTY;  Surgeon: Conrad Burr, MD;  Location: [facility];  Service: Cardiovascular;  Laterality: Left;  Posterior tibial  . PERIPHERAL VASCULAR INTERVENTION Left 05/11/2018   Procedure: PERIPHERAL VASCULAR INTERVENTION;  Surgeon: Conrad Hymera, MD;  Location: [facility];  Service: Cardiovascular;  Laterality: Left;  tibioperoneal trunk  .  TEE WITHOUT CARDIOVERSION N/A 11/20/2018   Procedure: TRANSESOPHAGEAL ECHOCARDIOGRAM (TEE);  Surgeon: Jerline Pain, MD;  Location: [facility] OR;  Service: Cardiovascular;  Laterality: N/A;  . TONSILLECTOMY          Home Medications    Prior to Admission medications   Medication Sig Start Date End Date Taking? Authorizing Provider  acetaminophen (TYLENOL) 325 MG tablet Take 650 mg by mouth every 6 (six) hours as needed (pain).     [provider]  atorvastatin (LIPITOR) 40 MG tablet Take 40 mg by mouth at bedtime.     [provider]  Continuous Blood Gluc Sensor (FREESTYLE LIBRE 14 DAY SENSOR) MISC 1 Cartridge by Does not apply route every 14 (fourteen) days. Patient not taking: Reported on 12/07/2018 12/01/18   Love, Ivan Anchors, PA-C  ferric citrate  (AURYXIA) 1 GM 210 MG(Fe) tablet Take 630 mg by mouth 3 (three) times daily with meals.     [provider]  glucose monitoring kit (FREESTYLE) monitoring kit 1 each by Does not apply route 4 (four) times daily - after meals and at bedtime. 1 month Diabetic Testing Supplies for QAC-QHS accuchecks.Any brand OK Patient not taking: Reported on 12/07/2018 11/29/14   Thurnell Lose, MD  hydrocerin (EUCERIN) CREA Apply 1 application topically 2 (two) times daily. To right leg and Patient not taking: Reported on 12/12/2018 12/01/18   Love, Ivan Anchors, PA-C  insulin NPH-regular Human (NOVOLIN 70/30) (70-30) 100 UNIT/ML injection Inject 5-6 Units into the skin 2 (two) times daily with a meal. Sliding Scale - based on CBG    [provider]  Insulin Syringe-Needle U-100 25G X 1" 1 ML MISC Any brand, for 4 times a day insulin SQ, 1 month supply. 11/29/14   Thurnell Lose, MD  methocarbamol (ROBAXIN) 750 MG tablet Take 1 tablet (750 mg total) by mouth 4 (four) times daily. Patient not taking: Reported on 12/07/2018 12/01/18   Love, Ivan Anchors, PA-C  Multiple Vitamins-Minerals (MULTIVITAMIN WITH MINERALS) tablet Take 1 tablet by mouth daily with supper.     [provider]  mupirocin ointment (BACTROBAN) 2 % Apply 1 application topically daily as needed (rassh).  08/05/16   [provider]  polyethylene glycol (MIRALAX / GLYCOLAX) packet Take 17 g by mouth daily. Patient not taking: Reported on 12/07/2018 12/01/18   Love, Ivan Anchors, PA-C  primidone (MYSOLINE) 50 MG tablet Take 1 tablet (50 mg total) by mouth at bedtime. 12/01/18   Love, Ivan Anchors, PA-C  senna-docusate (SENOKOT-S) 8.6-50 MG tablet Take 2 tablets by mouth 2 (two) times daily. Patient not taking: Reported on 12/07/2018 12/01/18   Love, Ivan Anchors, PA-C  timolol (TIMOPTIC) 0.5 % ophthalmic solution Place 1 drop into the left eye 2 (two) times daily.    [provider]  traMADol (ULTRAM) 50 MG tablet Take 1 tablet  (50 mg total) by mouth every 6 (six) hours as needed for severe pain. Patient not taking: Reported on 12/07/2018 12/01/18   Love, Ivan Anchors, PA-C    Family History Family History  Problem Relation Age of Onset  . Heart disease Mother   . Diabetes Father   . Diabetes Brother   . Diabetes Sister   . Diabetes Brother   . Diabetes Brother     Social History Social History   Tobacco Use  . Smoking status: Former Smoker    Packs/day: 0.50    Years: 4.00    Pack years: 2.00    Types:  Cigarettes  . Smokeless tobacco: Never Used  . Tobacco comment: about 40 yrs ago  Substance Use Topics  . Alcohol use: No    Alcohol/week: 0.0 standard drinks  . Drug use: No     Allergies   Penicillins   Review of Systems Review of Systems  Constitutional: Negative for fever.  Skin: Positive for wound.  All other systems reviewed and are negative.    Physical Exam Updated Vital Signs BP (!) 149/73 (BP Location: Right Arm)   Pulse 71   Temp 98.3 F (36.8 C) (Oral)   Resp 18   SpO2 98%   Physical Exam Vitals signs and nursing note reviewed.  Constitutional:      General: He is not in acute distress.    Appearance: He is well-developed.  HENT:     Head: Normocephalic and atraumatic.  Cardiovascular:     Rate and Rhythm: Normal rate and regular rhythm.     Heart sounds: Normal heart sounds. No murmur.  Pulmonary:     Effort: Pulmonary effort is normal. No respiratory distress.     Breath sounds: Normal breath sounds.  Abdominal:     General: There is no distension.     Palpations: Abdomen is soft.     Tenderness: There is no abdominal tenderness.  Skin:    General: Skin is warm and dry.     Comments: See image below. No warmth or surrounding erythema appreciated.   Neurological:     Mental Status: He is alert and oriented to person, place, and time.        ED Treatments / Results  Labs (all labs ordered are listed, but only abnormal results are displayed) Labs  Reviewed - No data to display  EKG None  Radiology No results found.  Procedures Procedures (including critical care time)  Medications Ordered in ED Medications - No data to display   Initial Impression / Assessment and Plan / ED Course  I have reviewed the triage vital signs and the nursing notes.  Pertinent labs & imaging results that were available during my care of the patient were reviewed by me and considered in my medical decision making (see chart for details).    [name] LITZINGER Sr. is a 68 y.o. male who presents to ED for evaluation of wound dehiscence from surgical site s/p BKA the end of November.  He had a mechanical fall and hit he stump, causing his surgical incision wound open.  There are no signs of infection on exam.  He is afebrile and hemodynamically stable.  He has an appointment with his orthopedist this week.  Recommended he follow-up with them as directed, preferably call to schedule an earlier appointment if at all possible.  I have put in a referral to wound care clinic as well.  We discussed home wound care instructions with patient and wife at bedside.  Reasons to return to ER/signs of infection were discussed.  All questions answered.  Patient discussed with Dr. Johnney Killian who agrees with treatment plan.     Final Clinical Impressions(s) / ED Diagnoses   Final diagnoses:  Wound dehiscence    ED Discharge Orders         Ordered    Ambulatory referral to Wound Clinic    Comments:  Wound dehiscence to stump s/p BKA requiring specialty wound care.   12/31/18 [place], Ozella Almond, PA-C 12/31/18 1751    Charlesetta Shanks,  MD 12/31/18 2205

## 2018-12-31 NOTE — ED Triage Notes (Signed)
Patient arrived from home with wife related to a fall from yesterday. PT's spouse states that he had an amputation on November 25th, 2019; he fell yesterday and the surgical site appears to be "opening"

## 2018-12-31 NOTE — Discharge Instructions (Signed)
It was my pleasure taking care of you today!   As we discussed, I would like you to call the orthopedic doctor's office in the morning to try to move your appointment with him up. Keep wound clean with dressing changes twice daily. I have placed a referral to the wound care clinic. Hopefully someone will reach out to you this week regarding an appointment.   Monitor your wound closely for signs of infection such as redness around the wound, drainage from the wound or fever.  Return to the emergency department if you experience these symptoms, new or worsening symptoms develop or you have any additional concerns.

## 2019-01-01 ENCOUNTER — Telehealth: Payer: Self-pay | Admitting: *Deleted

## 2019-01-01 DIAGNOSIS — E1121 Type 2 diabetes mellitus with diabetic nephropathy: Secondary | ICD-10-CM | POA: Diagnosis not present

## 2019-01-01 DIAGNOSIS — D631 Anemia in chronic kidney disease: Secondary | ICD-10-CM | POA: Diagnosis not present

## 2019-01-01 DIAGNOSIS — N2581 Secondary hyperparathyroidism of renal origin: Secondary | ICD-10-CM | POA: Diagnosis not present

## 2019-01-01 DIAGNOSIS — N186 End stage renal disease: Secondary | ICD-10-CM | POA: Diagnosis not present

## 2019-01-01 DIAGNOSIS — Z992 Dependence on renal dialysis: Secondary | ICD-10-CM | POA: Diagnosis not present

## 2019-01-01 DIAGNOSIS — Z9889 Other specified postprocedural states: Secondary | ICD-10-CM | POA: Diagnosis not present

## 2019-01-01 DIAGNOSIS — D509 Iron deficiency anemia, unspecified: Secondary | ICD-10-CM | POA: Diagnosis not present

## 2019-01-01 NOTE — Telephone Encounter (Signed)
[name] from [facility] [employer] called to report Case had a missed visit 12/19/18 but they have been seeing him regularly.  They are required to report missed visits.

## 2019-01-02 DIAGNOSIS — Z4781 Encounter for orthopedic aftercare following surgical amputation: Secondary | ICD-10-CM | POA: Diagnosis not present

## 2019-01-02 DIAGNOSIS — E11621 Type 2 diabetes mellitus with foot ulcer: Secondary | ICD-10-CM | POA: Diagnosis not present

## 2019-01-02 DIAGNOSIS — L97411 Non-pressure chronic ulcer of right heel and midfoot limited to breakdown of skin: Secondary | ICD-10-CM | POA: Diagnosis not present

## 2019-01-02 DIAGNOSIS — E1142 Type 2 diabetes mellitus with diabetic polyneuropathy: Secondary | ICD-10-CM | POA: Diagnosis not present

## 2019-01-02 DIAGNOSIS — E1122 Type 2 diabetes mellitus with diabetic chronic kidney disease: Secondary | ICD-10-CM | POA: Diagnosis not present

## 2019-01-02 DIAGNOSIS — I120 Hypertensive chronic kidney disease with stage 5 chronic kidney disease or end stage renal disease: Secondary | ICD-10-CM | POA: Diagnosis not present

## 2019-01-03 DIAGNOSIS — N2581 Secondary hyperparathyroidism of renal origin: Secondary | ICD-10-CM | POA: Diagnosis not present

## 2019-01-03 DIAGNOSIS — N186 End stage renal disease: Secondary | ICD-10-CM | POA: Diagnosis not present

## 2019-01-03 DIAGNOSIS — E1121 Type 2 diabetes mellitus with diabetic nephropathy: Secondary | ICD-10-CM | POA: Diagnosis not present

## 2019-01-03 DIAGNOSIS — D509 Iron deficiency anemia, unspecified: Secondary | ICD-10-CM | POA: Diagnosis not present

## 2019-01-03 DIAGNOSIS — D631 Anemia in chronic kidney disease: Secondary | ICD-10-CM | POA: Diagnosis not present

## 2019-01-03 DIAGNOSIS — Z992 Dependence on renal dialysis: Secondary | ICD-10-CM | POA: Diagnosis not present

## 2019-01-04 DIAGNOSIS — Z4781 Encounter for orthopedic aftercare following surgical amputation: Secondary | ICD-10-CM | POA: Diagnosis not present

## 2019-01-04 DIAGNOSIS — E1122 Type 2 diabetes mellitus with diabetic chronic kidney disease: Secondary | ICD-10-CM | POA: Diagnosis not present

## 2019-01-04 DIAGNOSIS — L97411 Non-pressure chronic ulcer of right heel and midfoot limited to breakdown of skin: Secondary | ICD-10-CM | POA: Diagnosis not present

## 2019-01-04 DIAGNOSIS — I120 Hypertensive chronic kidney disease with stage 5 chronic kidney disease or end stage renal disease: Secondary | ICD-10-CM | POA: Diagnosis not present

## 2019-01-04 DIAGNOSIS — E11621 Type 2 diabetes mellitus with foot ulcer: Secondary | ICD-10-CM | POA: Diagnosis not present

## 2019-01-04 DIAGNOSIS — E1142 Type 2 diabetes mellitus with diabetic polyneuropathy: Secondary | ICD-10-CM | POA: Diagnosis not present

## 2019-01-05 DIAGNOSIS — Z992 Dependence on renal dialysis: Secondary | ICD-10-CM | POA: Diagnosis not present

## 2019-01-05 DIAGNOSIS — D509 Iron deficiency anemia, unspecified: Secondary | ICD-10-CM | POA: Diagnosis not present

## 2019-01-05 DIAGNOSIS — N186 End stage renal disease: Secondary | ICD-10-CM | POA: Diagnosis not present

## 2019-01-05 DIAGNOSIS — N2581 Secondary hyperparathyroidism of renal origin: Secondary | ICD-10-CM | POA: Diagnosis not present

## 2019-01-05 DIAGNOSIS — L97411 Non-pressure chronic ulcer of right heel and midfoot limited to breakdown of skin: Secondary | ICD-10-CM | POA: Diagnosis not present

## 2019-01-05 DIAGNOSIS — I120 Hypertensive chronic kidney disease with stage 5 chronic kidney disease or end stage renal disease: Secondary | ICD-10-CM | POA: Diagnosis not present

## 2019-01-05 DIAGNOSIS — D631 Anemia in chronic kidney disease: Secondary | ICD-10-CM | POA: Diagnosis not present

## 2019-01-05 DIAGNOSIS — E1142 Type 2 diabetes mellitus with diabetic polyneuropathy: Secondary | ICD-10-CM | POA: Diagnosis not present

## 2019-01-05 DIAGNOSIS — E1122 Type 2 diabetes mellitus with diabetic chronic kidney disease: Secondary | ICD-10-CM | POA: Diagnosis not present

## 2019-01-05 DIAGNOSIS — Z4781 Encounter for orthopedic aftercare following surgical amputation: Secondary | ICD-10-CM | POA: Diagnosis not present

## 2019-01-05 DIAGNOSIS — E11621 Type 2 diabetes mellitus with foot ulcer: Secondary | ICD-10-CM | POA: Diagnosis not present

## 2019-01-05 DIAGNOSIS — E1121 Type 2 diabetes mellitus with diabetic nephropathy: Secondary | ICD-10-CM | POA: Diagnosis not present

## 2019-01-08 ENCOUNTER — Other Ambulatory Visit: Payer: Self-pay

## 2019-01-08 ENCOUNTER — Encounter: Payer: Self-pay | Admitting: Physical Medicine & Rehabilitation

## 2019-01-08 ENCOUNTER — Encounter: Payer: Medicare Other | Attending: Physical Medicine & Rehabilitation | Admitting: Physical Medicine & Rehabilitation

## 2019-01-08 VITALS — BP 137/76 | HR 74 | Wt 199.5 lb

## 2019-01-08 DIAGNOSIS — Z992 Dependence on renal dialysis: Secondary | ICD-10-CM | POA: Diagnosis not present

## 2019-01-08 DIAGNOSIS — I739 Peripheral vascular disease, unspecified: Secondary | ICD-10-CM

## 2019-01-08 DIAGNOSIS — N186 End stage renal disease: Secondary | ICD-10-CM | POA: Diagnosis not present

## 2019-01-08 DIAGNOSIS — Z89512 Acquired absence of left leg below knee: Secondary | ICD-10-CM | POA: Diagnosis not present

## 2019-01-08 DIAGNOSIS — N2581 Secondary hyperparathyroidism of renal origin: Secondary | ICD-10-CM | POA: Diagnosis not present

## 2019-01-08 DIAGNOSIS — E1121 Type 2 diabetes mellitus with diabetic nephropathy: Secondary | ICD-10-CM | POA: Diagnosis not present

## 2019-01-08 DIAGNOSIS — D631 Anemia in chronic kidney disease: Secondary | ICD-10-CM | POA: Diagnosis not present

## 2019-01-08 DIAGNOSIS — D509 Iron deficiency anemia, unspecified: Secondary | ICD-10-CM | POA: Diagnosis not present

## 2019-01-08 NOTE — Progress Notes (Signed)
Subjective:    Patient ID: [name] LOVERN Sr., male    DOB: 1951-08-22, 68 y.o.   MRN: [id number]  HPI   Mr. Klingel is here in follow up of his left BKA. He fell on the leg on 12/31/18 and re-opened the wound. He has seen in the ED and has followed up with ortho. He's doing a wet-dry dressing and home health RN is following. Wound is pink according to patient.  Other than some initial increase in the pain the day he fell, his pain is been under reasonable control.  He is no longer using pain medication or muscle relaxants.  Prior to the fall he had been doing fairly well.  Apparently [employer] is now following him for his prosthetic needs.  Pain Inventory Average Pain 1 Pain Right Now 0 My pain is intermittent and aching  In the last 24 hours, has pain interfered with the following? General activity 0 Relation with others 0 Enjoyment of life 0 What TIME of day is your pain at its worst? night Sleep (in general) Good  Pain is worse with: sitting Pain improves with: medication Relief from Meds: 7  Mobility use a walker how many minutes can you walk? 4 ability to climb steps?  no do you drive?  no use a wheelchair Do you have any goals in this area?  yes  Function not employed: date last employed 11/2014 retired I need assistance with the following:  toileting and household duties  Neuro/Psych numbness tremor trouble walking  Prior Studies Any changes since last visit?  no  Physicians involved in your care [place]   Family History  Problem Relation Age of Onset  . Heart disease Mother   . Diabetes Father   . Diabetes Brother   . Diabetes Sister   . Diabetes Brother   . Diabetes Brother    Social History   Socioeconomic History  . Marital status: Married    Spouse name: [name]  . Number of children: 3  . Years of education: 73  . Highest education level: Not on file  Occupational History  . Occupation: Primary school teacher: [employer]  Social Needs  . Financial resource strain: Not on file  . Food insecurity:    Worry: Not on file    Inability: Not on file  . Transportation needs:    Medical: Not on file    Non-medical: Not on file  Tobacco Use  . Smoking status: Former Smoker    Packs/day: 0.50    Years: 4.00    Pack years: 2.00    Types: Cigarettes  . Smokeless tobacco: Never Used  . Tobacco comment: about 40 yrs ago  Substance and Sexual Activity  . Alcohol use: No    Alcohol/week: 0.0 standard drinks  . Drug use: No  . Sexual activity: Yes  Lifestyle  . Physical activity:    Days per week: Not on file    Minutes per session: Not on file  . Stress: Not on file  Relationships  . Social connections:    Talks on phone: Not on file    Gets together: Not on file    Attends religious service: Not on file    Active member of club or organization: Not on file    Attends meetings of clubs or organizations: Not on file    Relationship status: Not on file  Other Topics Concern  . Not on  file  Social History Narrative  . Not on file   Past Surgical History:  Procedure Laterality Date  . ABDOMINAL AORTOGRAM N/A 05/04/2018   Procedure: ABDOMINAL AORTOGRAM;  Surgeon: Conrad [place], MD;  Location: [place] [facility];  Service: Cardiovascular;  Laterality: N/A;  . AMPUTATION Left 11/20/2018   Procedure: AMPUTATION BELOW KNEE;  Surgeon: Dorna Leitz, MD;  Location: [place];  Service: Orthopedics;  Laterality: Left;  . AMPUTATION TOE Right 01/21/2017   Procedure: AMPUTATION 2ND TOE RIGHT FOOT ;  Surgeon: Dorna Leitz, MD;  Location: [place];  Service: Orthopedics;  Laterality: Right;  . AMPUTATION TOE Left 05/18/2017   Procedure: 5TH RAY AMPUTATION  and EXCISION CUBOID;  Surgeon: Dorna Leitz, MD;  Location: [place];  Service: Orthopedics;  Laterality: Left;  . AMPUTATION TOE Bilateral 01/27/2018   Procedure: RIGHT FOOT 3RD TOE AMPUTATION WITH INCISIONAL WOUND DEBRIDEMENT. LEFT FOOT  2ND TOE AMPUTATION AND INCISIONAL WOUND DEBRIDEMENT.;  Surgeon: Dorna Leitz, MD;  Location: [facility];  Service: Orthopedics;  Laterality: Bilateral;  . AV FISTULA PLACEMENT Left 11/27/2014   Procedure: ARTERIOVENOUS (AV) FISTULA CREATION;  Surgeon: Conrad [place], MD;  Location: [place];  Service: Vascular;  Laterality: Left;  . CHOLECYSTECTOMY    . COLONOSCOPY N/A 11/28/2014   Procedure: COLONOSCOPY;  Surgeon: Ladene Artist, MD;  [facility]: [facility] ENDOS[facility];  Service: Endoscopy;  Laterality: N/A;  . ESOPHAGOGASTRODUODENOSCOPY N/A 11/28/2014   Procedure: ESOPHAGOGASTRODUODENOSCOPY (EGD);  Surgeon: Ladene Artist, MD;  Location: [facility] ENDOSCOPY;  Service: Endoscopy;  Laterality: N/A;  . EYE SURGERY     retina and cataract left eye surgery  . I&D EXTREMITY  01/28/2012   Procedure: IRRIGATION AND DEBRIDEMENT EXTREMITY;  Surgeon: Paulene Floor, MD;  Location: [facility];  Service: Orthopedics;  Laterality: Left;  irrigation and drainage left hand  . I&D EXTREMITY  01/30/2012   Procedure: IRRIGATION AND DEBRIDEMENT EXTREMITY;  Surgeon: Paulene Floor, MD;  Location: [facility];  Service: Orthopedics;  Laterality: Left;  flexor teno synovectomy and extensor tenosynovectomy arthrosynovectomy wristjoint  . I&D EXTREMITY  02/01/2012   Procedure: IRRIGATION AND DEBRIDEMENT EXTREMITY;  Surgeon: Paulene Floor, MD;  [facility];  Service: Orthopedics;  Laterality: Left;  Left Wrist, Hand and Forearm  . IRRIGATION AND DEBRIDEMENT FOOT Left 01/21/2017   Procedure: DEBRIDEMENT LEFT PLANTAR WOUND;  Surgeon: Dorna Leitz, MD;  Location: [place];  Service: Orthopedics;  Laterality: Left;  . Left 4th toe amputation    . LEFT AND RIGHT HEART CATHETERIZATION WITH CORONARY ANGIOGRAM N/A 11/26/2014   Procedure: LEFT AND RIGHT HEART CATHETERIZATION WITH CORONARY ANGIOGRAM;  Surgeon: Leonie Man, MD;  Location: [facility] C[facility] LAB;  Service: Cardiovascular;  Laterality: N/A;  . LOWER EXTREMITY ANGIOGRAPHY Bilateral 05/04/2018    Procedure: Lower Extremity Angiography;  Surgeon: Conrad [place], MD;  Location: [place] [facility];  Service: Cardiovascular;  Laterality: Bilateral;  . LOWER EXTREMITY ANGIOGRAPHY Left 05/11/2018   Procedure: LOWER EXTREMITY ANGIOGRAPHY;  Surgeon: Conrad [place], MD;  Location: [place] [facility];  Service: Cardiovascular;  Laterality: Left;  . PERIPHERAL VASCULAR BALLOON ANGIOPLASTY Left 05/11/2018   Procedure: PERIPHERAL VASCULAR BALLOON ANGIOPLASTY;  Surgeon: Conrad [place], MD;  Location: [place] [facility];  Service: Cardiovascular;  Laterality: Left;  Posterior tibial  . PERIPHERAL VASCULAR INTERVENTION Left 05/11/2018   Procedure: PERIPHERAL VASCULAR INTERVENTION;  Surgeon: Conrad [place], MD;  Location: [place] [facility];  Service: Cardiovascular;  Laterality: Left;  tibioperoneal trunk  .  TEE WITHOUT CARDIOVERSION N/A 11/20/2018   Procedure: TRANSESOPHAGEAL ECHOCARDIOGRAM (TEE);  Surgeon: Jerline Pain, MD;  Location[facility];  Service: Cardiovascular;  Laterality: N/A;  . TONSILLECTOMY     Past Medical History:  Diagnosis Date  . Cardiomyopathy ([place])   . Cataract    right eye  . Diabetes mellitus    type 2  . Diabetic foot ulcer ([place])   . Diabetic infection of right foot ([place])   . Diabetic peripheral neuropathy ([place])   . ESRD (end stage renal disease) ([place])    m-w-f [facility] [place] [address]  . Glaucoma    left eye  . Headache   . HTN (hypertension)   . Osteomyelitis ([place])   . Peripheral vascular disease (HCC)    critical limb ischemia   BP 137/76   Pulse 74   Wt 199 lb 8.3 oz (90.5 kg)   SpO2 98%   BMI 27.83 kg/m   Opioid Risk Score:   Fall Risk Score:  `1  Depression screen PHQ 2/9  Depression screen [facility] 2/9 01/08/2019 10/17/2018 08/29/2018 07/27/2018 03/28/2018 02/02/2018 10/05/2017  Decreased Interest 0 0 0 0 0 0 0  Down, Depressed, Hopeless 0 0 0 0 0 0 0  PHQ - 2 Score 0 0 0 0 0 0 0    Review of Systems  Constitutional: Negative.   HENT: Negative.   Negative for congestion.   Eyes: Negative.   Respiratory: Negative.   Cardiovascular: Negative.   Gastrointestinal: Negative.   Endocrine: Negative.   Genitourinary: Negative.   Musculoskeletal: Negative.   Skin: Negative.   Allergic/Immunologic: Negative.   Neurological: Negative.   Hematological: Negative.   Psychiatric/Behavioral: Negative.   All other systems reviewed and are negative.      Objective:   Physical Exam   General: Alert and oriented x 3, No apparent distress HEENT: Head is normocephalic, atraumatic, PERRLA, EOMI, sclera anicteric, oral mucosa pink and moist, dentition intact, ext ear canals clear,  Neck: Supple without JVD or lymphadenopathy Heart: Reg rate and rhythm. No murmurs rubs or gallops Chest: CTA bilaterally without wheezes, rales, or rhonchi; no distress Abdomen: Soft, non-tender, non-distended, bowel sounds positive. Extremities: No clubbing, cyanosis, or edema. Pulses are 2+ Skin:  2cm x 7cm open wound with slight fibronecrotic tissue and eschar at periphery. generally pink granulation in center.  Neuro: Pt is cognitively appropriate with normal insight, memory, and awareness. Cranial nerves 2-12 are intact. Sensory exam is normal. Reflexes are 2+ in all 4's. Fine motor coordination is intact. No tremors. Motor function is grossly 5/5.  Musculoskeletal: Full ROM, No pain with AROM or PROM in the neck, trunk, or extremities. Posture appropriate. Left BKA is well shaped Psych: Pt's affect is appropriate. Pt is cooperative        Assessment & Plan:  1. Deficits with mobility, transfers, endurance, self-care secondary to left BKA.             -Continue CIR therapies including PT, OT     -follow up with ortho this week  -continue wet to dry and compression to wound. Re-wrapped leg in office today  -prosthesis per [employer]---probably 4-6 weeks away at this point   2. Pain mgt:  -well controlled at present   3. DM per primary  Fifteen minutes of  face to face patient care time were spent during this visit. All questions were encouraged and answered.  Follow up prn

## 2019-01-08 NOTE — Patient Instructions (Signed)
PLEASE FEEL FREE TO CALL OUR OFFICE WITH ANY PROBLEMS OR QUESTIONS ([phone])      

## 2019-01-09 DIAGNOSIS — Z9889 Other specified postprocedural states: Secondary | ICD-10-CM | POA: Diagnosis not present

## 2019-01-09 DIAGNOSIS — E1122 Type 2 diabetes mellitus with diabetic chronic kidney disease: Secondary | ICD-10-CM | POA: Diagnosis not present

## 2019-01-09 DIAGNOSIS — E1142 Type 2 diabetes mellitus with diabetic polyneuropathy: Secondary | ICD-10-CM | POA: Diagnosis not present

## 2019-01-09 DIAGNOSIS — L97411 Non-pressure chronic ulcer of right heel and midfoot limited to breakdown of skin: Secondary | ICD-10-CM | POA: Diagnosis not present

## 2019-01-09 DIAGNOSIS — I120 Hypertensive chronic kidney disease with stage 5 chronic kidney disease or end stage renal disease: Secondary | ICD-10-CM | POA: Diagnosis not present

## 2019-01-09 DIAGNOSIS — E11621 Type 2 diabetes mellitus with foot ulcer: Secondary | ICD-10-CM | POA: Diagnosis not present

## 2019-01-09 DIAGNOSIS — Z4781 Encounter for orthopedic aftercare following surgical amputation: Secondary | ICD-10-CM | POA: Diagnosis not present

## 2019-01-10 ENCOUNTER — Encounter (HOSPITAL_BASED_OUTPATIENT_CLINIC_OR_DEPARTMENT_OTHER): Payer: Medicare Other | Attending: Physician Assistant

## 2019-01-10 ENCOUNTER — Encounter (HOSPITAL_BASED_OUTPATIENT_CLINIC_OR_DEPARTMENT_OTHER): Payer: Self-pay

## 2019-01-10 DIAGNOSIS — E1140 Type 2 diabetes mellitus with diabetic neuropathy, unspecified: Secondary | ICD-10-CM | POA: Diagnosis not present

## 2019-01-10 DIAGNOSIS — L97822 Non-pressure chronic ulcer of other part of left lower leg with fat layer exposed: Secondary | ICD-10-CM | POA: Diagnosis not present

## 2019-01-10 DIAGNOSIS — D509 Iron deficiency anemia, unspecified: Secondary | ICD-10-CM | POA: Diagnosis not present

## 2019-01-10 DIAGNOSIS — T8781 Dehiscence of amputation stump: Secondary | ICD-10-CM | POA: Insufficient documentation

## 2019-01-10 DIAGNOSIS — D631 Anemia in chronic kidney disease: Secondary | ICD-10-CM | POA: Diagnosis not present

## 2019-01-10 DIAGNOSIS — Z794 Long term (current) use of insulin: Secondary | ICD-10-CM | POA: Insufficient documentation

## 2019-01-10 DIAGNOSIS — Y835 Amputation of limb(s) as the cause of abnormal reaction of the patient, or of later complication, without mention of misadventure at the time of the procedure: Secondary | ICD-10-CM | POA: Diagnosis not present

## 2019-01-10 DIAGNOSIS — I120 Hypertensive chronic kidney disease with stage 5 chronic kidney disease or end stage renal disease: Secondary | ICD-10-CM | POA: Diagnosis not present

## 2019-01-10 DIAGNOSIS — Z992 Dependence on renal dialysis: Secondary | ICD-10-CM | POA: Insufficient documentation

## 2019-01-10 DIAGNOSIS — Z89512 Acquired absence of left leg below knee: Secondary | ICD-10-CM | POA: Insufficient documentation

## 2019-01-10 DIAGNOSIS — N2581 Secondary hyperparathyroidism of renal origin: Secondary | ICD-10-CM | POA: Diagnosis not present

## 2019-01-10 DIAGNOSIS — N186 End stage renal disease: Secondary | ICD-10-CM | POA: Diagnosis not present

## 2019-01-10 DIAGNOSIS — E1122 Type 2 diabetes mellitus with diabetic chronic kidney disease: Secondary | ICD-10-CM | POA: Diagnosis not present

## 2019-01-10 DIAGNOSIS — E1121 Type 2 diabetes mellitus with diabetic nephropathy: Secondary | ICD-10-CM | POA: Diagnosis not present

## 2019-01-10 DIAGNOSIS — T8131XA Disruption of external operation (surgical) wound, not elsewhere classified, initial encounter: Secondary | ICD-10-CM | POA: Diagnosis not present

## 2019-01-11 DIAGNOSIS — E1122 Type 2 diabetes mellitus with diabetic chronic kidney disease: Secondary | ICD-10-CM | POA: Diagnosis not present

## 2019-01-11 DIAGNOSIS — E1142 Type 2 diabetes mellitus with diabetic polyneuropathy: Secondary | ICD-10-CM | POA: Diagnosis not present

## 2019-01-11 DIAGNOSIS — E11621 Type 2 diabetes mellitus with foot ulcer: Secondary | ICD-10-CM | POA: Diagnosis not present

## 2019-01-11 DIAGNOSIS — L97411 Non-pressure chronic ulcer of right heel and midfoot limited to breakdown of skin: Secondary | ICD-10-CM | POA: Diagnosis not present

## 2019-01-11 DIAGNOSIS — I120 Hypertensive chronic kidney disease with stage 5 chronic kidney disease or end stage renal disease: Secondary | ICD-10-CM | POA: Diagnosis not present

## 2019-01-11 DIAGNOSIS — Z4781 Encounter for orthopedic aftercare following surgical amputation: Secondary | ICD-10-CM | POA: Diagnosis not present

## 2019-01-12 DIAGNOSIS — N186 End stage renal disease: Secondary | ICD-10-CM | POA: Diagnosis not present

## 2019-01-12 DIAGNOSIS — E1121 Type 2 diabetes mellitus with diabetic nephropathy: Secondary | ICD-10-CM | POA: Diagnosis not present

## 2019-01-12 DIAGNOSIS — N2581 Secondary hyperparathyroidism of renal origin: Secondary | ICD-10-CM | POA: Diagnosis not present

## 2019-01-12 DIAGNOSIS — D631 Anemia in chronic kidney disease: Secondary | ICD-10-CM | POA: Diagnosis not present

## 2019-01-12 DIAGNOSIS — D509 Iron deficiency anemia, unspecified: Secondary | ICD-10-CM | POA: Diagnosis not present

## 2019-01-12 DIAGNOSIS — Z992 Dependence on renal dialysis: Secondary | ICD-10-CM | POA: Diagnosis not present

## 2019-01-13 DIAGNOSIS — E1122 Type 2 diabetes mellitus with diabetic chronic kidney disease: Secondary | ICD-10-CM | POA: Diagnosis not present

## 2019-01-13 DIAGNOSIS — E1142 Type 2 diabetes mellitus with diabetic polyneuropathy: Secondary | ICD-10-CM | POA: Diagnosis not present

## 2019-01-13 DIAGNOSIS — E11621 Type 2 diabetes mellitus with foot ulcer: Secondary | ICD-10-CM | POA: Diagnosis not present

## 2019-01-13 DIAGNOSIS — L97411 Non-pressure chronic ulcer of right heel and midfoot limited to breakdown of skin: Secondary | ICD-10-CM | POA: Diagnosis not present

## 2019-01-13 DIAGNOSIS — Z4781 Encounter for orthopedic aftercare following surgical amputation: Secondary | ICD-10-CM | POA: Diagnosis not present

## 2019-01-13 DIAGNOSIS — I120 Hypertensive chronic kidney disease with stage 5 chronic kidney disease or end stage renal disease: Secondary | ICD-10-CM | POA: Diagnosis not present

## 2019-01-15 DIAGNOSIS — E1122 Type 2 diabetes mellitus with diabetic chronic kidney disease: Secondary | ICD-10-CM | POA: Diagnosis not present

## 2019-01-15 DIAGNOSIS — D509 Iron deficiency anemia, unspecified: Secondary | ICD-10-CM | POA: Diagnosis not present

## 2019-01-15 DIAGNOSIS — N186 End stage renal disease: Secondary | ICD-10-CM | POA: Diagnosis not present

## 2019-01-15 DIAGNOSIS — D631 Anemia in chronic kidney disease: Secondary | ICD-10-CM | POA: Diagnosis not present

## 2019-01-15 DIAGNOSIS — Z992 Dependence on renal dialysis: Secondary | ICD-10-CM | POA: Diagnosis not present

## 2019-01-15 DIAGNOSIS — E11621 Type 2 diabetes mellitus with foot ulcer: Secondary | ICD-10-CM | POA: Diagnosis not present

## 2019-01-15 DIAGNOSIS — E1142 Type 2 diabetes mellitus with diabetic polyneuropathy: Secondary | ICD-10-CM | POA: Diagnosis not present

## 2019-01-15 DIAGNOSIS — N2581 Secondary hyperparathyroidism of renal origin: Secondary | ICD-10-CM | POA: Diagnosis not present

## 2019-01-15 DIAGNOSIS — L97411 Non-pressure chronic ulcer of right heel and midfoot limited to breakdown of skin: Secondary | ICD-10-CM | POA: Diagnosis not present

## 2019-01-15 DIAGNOSIS — Z4781 Encounter for orthopedic aftercare following surgical amputation: Secondary | ICD-10-CM | POA: Diagnosis not present

## 2019-01-15 DIAGNOSIS — E1121 Type 2 diabetes mellitus with diabetic nephropathy: Secondary | ICD-10-CM | POA: Diagnosis not present

## 2019-01-15 DIAGNOSIS — I120 Hypertensive chronic kidney disease with stage 5 chronic kidney disease or end stage renal disease: Secondary | ICD-10-CM | POA: Diagnosis not present

## 2019-01-16 DIAGNOSIS — N186 End stage renal disease: Secondary | ICD-10-CM | POA: Diagnosis not present

## 2019-01-16 DIAGNOSIS — S91301A Unspecified open wound, right foot, initial encounter: Secondary | ICD-10-CM | POA: Diagnosis not present

## 2019-01-16 DIAGNOSIS — E1122 Type 2 diabetes mellitus with diabetic chronic kidney disease: Secondary | ICD-10-CM | POA: Diagnosis not present

## 2019-01-16 DIAGNOSIS — T8131XA Disruption of external operation (surgical) wound, not elsewhere classified, initial encounter: Secondary | ICD-10-CM | POA: Diagnosis not present

## 2019-01-16 DIAGNOSIS — Z992 Dependence on renal dialysis: Secondary | ICD-10-CM | POA: Diagnosis not present

## 2019-01-16 DIAGNOSIS — Z794 Long term (current) use of insulin: Secondary | ICD-10-CM | POA: Diagnosis not present

## 2019-01-16 DIAGNOSIS — T8781 Dehiscence of amputation stump: Secondary | ICD-10-CM | POA: Diagnosis not present

## 2019-01-16 DIAGNOSIS — I120 Hypertensive chronic kidney disease with stage 5 chronic kidney disease or end stage renal disease: Secondary | ICD-10-CM | POA: Diagnosis not present

## 2019-01-17 DIAGNOSIS — Z992 Dependence on renal dialysis: Secondary | ICD-10-CM | POA: Diagnosis not present

## 2019-01-17 DIAGNOSIS — N2581 Secondary hyperparathyroidism of renal origin: Secondary | ICD-10-CM | POA: Diagnosis not present

## 2019-01-17 DIAGNOSIS — E1121 Type 2 diabetes mellitus with diabetic nephropathy: Secondary | ICD-10-CM | POA: Diagnosis not present

## 2019-01-17 DIAGNOSIS — D631 Anemia in chronic kidney disease: Secondary | ICD-10-CM | POA: Diagnosis not present

## 2019-01-17 DIAGNOSIS — D509 Iron deficiency anemia, unspecified: Secondary | ICD-10-CM | POA: Diagnosis not present

## 2019-01-17 DIAGNOSIS — N186 End stage renal disease: Secondary | ICD-10-CM | POA: Diagnosis not present

## 2019-01-18 DIAGNOSIS — E1122 Type 2 diabetes mellitus with diabetic chronic kidney disease: Secondary | ICD-10-CM | POA: Diagnosis not present

## 2019-01-18 DIAGNOSIS — L97411 Non-pressure chronic ulcer of right heel and midfoot limited to breakdown of skin: Secondary | ICD-10-CM | POA: Diagnosis not present

## 2019-01-18 DIAGNOSIS — I120 Hypertensive chronic kidney disease with stage 5 chronic kidney disease or end stage renal disease: Secondary | ICD-10-CM | POA: Diagnosis not present

## 2019-01-18 DIAGNOSIS — Z4781 Encounter for orthopedic aftercare following surgical amputation: Secondary | ICD-10-CM | POA: Diagnosis not present

## 2019-01-18 DIAGNOSIS — E11621 Type 2 diabetes mellitus with foot ulcer: Secondary | ICD-10-CM | POA: Diagnosis not present

## 2019-01-18 DIAGNOSIS — E1142 Type 2 diabetes mellitus with diabetic polyneuropathy: Secondary | ICD-10-CM | POA: Diagnosis not present

## 2019-01-19 DIAGNOSIS — N2581 Secondary hyperparathyroidism of renal origin: Secondary | ICD-10-CM | POA: Diagnosis not present

## 2019-01-19 DIAGNOSIS — D631 Anemia in chronic kidney disease: Secondary | ICD-10-CM | POA: Diagnosis not present

## 2019-01-19 DIAGNOSIS — E1121 Type 2 diabetes mellitus with diabetic nephropathy: Secondary | ICD-10-CM | POA: Diagnosis not present

## 2019-01-19 DIAGNOSIS — N186 End stage renal disease: Secondary | ICD-10-CM | POA: Diagnosis not present

## 2019-01-19 DIAGNOSIS — Z992 Dependence on renal dialysis: Secondary | ICD-10-CM | POA: Diagnosis not present

## 2019-01-19 DIAGNOSIS — D509 Iron deficiency anemia, unspecified: Secondary | ICD-10-CM | POA: Diagnosis not present

## 2019-01-22 DIAGNOSIS — N2581 Secondary hyperparathyroidism of renal origin: Secondary | ICD-10-CM | POA: Diagnosis not present

## 2019-01-22 DIAGNOSIS — N186 End stage renal disease: Secondary | ICD-10-CM | POA: Diagnosis not present

## 2019-01-22 DIAGNOSIS — D631 Anemia in chronic kidney disease: Secondary | ICD-10-CM | POA: Diagnosis not present

## 2019-01-22 DIAGNOSIS — D509 Iron deficiency anemia, unspecified: Secondary | ICD-10-CM | POA: Diagnosis not present

## 2019-01-22 DIAGNOSIS — Z992 Dependence on renal dialysis: Secondary | ICD-10-CM | POA: Diagnosis not present

## 2019-01-22 DIAGNOSIS — E1121 Type 2 diabetes mellitus with diabetic nephropathy: Secondary | ICD-10-CM | POA: Diagnosis not present

## 2019-01-23 DIAGNOSIS — I120 Hypertensive chronic kidney disease with stage 5 chronic kidney disease or end stage renal disease: Secondary | ICD-10-CM | POA: Diagnosis not present

## 2019-01-23 DIAGNOSIS — T8781 Dehiscence of amputation stump: Secondary | ICD-10-CM | POA: Diagnosis not present

## 2019-01-23 DIAGNOSIS — Z992 Dependence on renal dialysis: Secondary | ICD-10-CM | POA: Diagnosis not present

## 2019-01-23 DIAGNOSIS — N186 End stage renal disease: Secondary | ICD-10-CM | POA: Diagnosis not present

## 2019-01-23 DIAGNOSIS — T8131XA Disruption of external operation (surgical) wound, not elsewhere classified, initial encounter: Secondary | ICD-10-CM | POA: Diagnosis not present

## 2019-01-23 DIAGNOSIS — Z794 Long term (current) use of insulin: Secondary | ICD-10-CM | POA: Diagnosis not present

## 2019-01-23 DIAGNOSIS — E1122 Type 2 diabetes mellitus with diabetic chronic kidney disease: Secondary | ICD-10-CM | POA: Diagnosis not present

## 2019-01-24 DIAGNOSIS — E1121 Type 2 diabetes mellitus with diabetic nephropathy: Secondary | ICD-10-CM | POA: Diagnosis not present

## 2019-01-24 DIAGNOSIS — Z992 Dependence on renal dialysis: Secondary | ICD-10-CM | POA: Diagnosis not present

## 2019-01-24 DIAGNOSIS — N186 End stage renal disease: Secondary | ICD-10-CM | POA: Diagnosis not present

## 2019-01-24 DIAGNOSIS — D509 Iron deficiency anemia, unspecified: Secondary | ICD-10-CM | POA: Diagnosis not present

## 2019-01-24 DIAGNOSIS — N2581 Secondary hyperparathyroidism of renal origin: Secondary | ICD-10-CM | POA: Diagnosis not present

## 2019-01-24 DIAGNOSIS — D631 Anemia in chronic kidney disease: Secondary | ICD-10-CM | POA: Diagnosis not present

## 2019-01-25 DIAGNOSIS — I120 Hypertensive chronic kidney disease with stage 5 chronic kidney disease or end stage renal disease: Secondary | ICD-10-CM | POA: Diagnosis not present

## 2019-01-25 DIAGNOSIS — Z4781 Encounter for orthopedic aftercare following surgical amputation: Secondary | ICD-10-CM | POA: Diagnosis not present

## 2019-01-25 DIAGNOSIS — E1122 Type 2 diabetes mellitus with diabetic chronic kidney disease: Secondary | ICD-10-CM | POA: Diagnosis not present

## 2019-01-25 DIAGNOSIS — E1142 Type 2 diabetes mellitus with diabetic polyneuropathy: Secondary | ICD-10-CM | POA: Diagnosis not present

## 2019-01-25 DIAGNOSIS — E11621 Type 2 diabetes mellitus with foot ulcer: Secondary | ICD-10-CM | POA: Diagnosis not present

## 2019-01-25 DIAGNOSIS — L97411 Non-pressure chronic ulcer of right heel and midfoot limited to breakdown of skin: Secondary | ICD-10-CM | POA: Diagnosis not present

## 2019-01-26 DIAGNOSIS — Z992 Dependence on renal dialysis: Secondary | ICD-10-CM | POA: Diagnosis not present

## 2019-01-26 DIAGNOSIS — N2581 Secondary hyperparathyroidism of renal origin: Secondary | ICD-10-CM | POA: Diagnosis not present

## 2019-01-26 DIAGNOSIS — D509 Iron deficiency anemia, unspecified: Secondary | ICD-10-CM | POA: Diagnosis not present

## 2019-01-26 DIAGNOSIS — E1121 Type 2 diabetes mellitus with diabetic nephropathy: Secondary | ICD-10-CM | POA: Diagnosis not present

## 2019-01-26 DIAGNOSIS — D631 Anemia in chronic kidney disease: Secondary | ICD-10-CM | POA: Diagnosis not present

## 2019-01-26 DIAGNOSIS — N186 End stage renal disease: Secondary | ICD-10-CM | POA: Diagnosis not present

## 2019-01-27 DIAGNOSIS — N186 End stage renal disease: Secondary | ICD-10-CM | POA: Diagnosis not present

## 2019-01-27 DIAGNOSIS — E1122 Type 2 diabetes mellitus with diabetic chronic kidney disease: Secondary | ICD-10-CM | POA: Diagnosis not present

## 2019-01-27 DIAGNOSIS — Z992 Dependence on renal dialysis: Secondary | ICD-10-CM | POA: Diagnosis not present

## 2019-01-29 DIAGNOSIS — Z992 Dependence on renal dialysis: Secondary | ICD-10-CM | POA: Diagnosis not present

## 2019-01-29 DIAGNOSIS — N186 End stage renal disease: Secondary | ICD-10-CM | POA: Diagnosis not present

## 2019-01-29 DIAGNOSIS — E1121 Type 2 diabetes mellitus with diabetic nephropathy: Secondary | ICD-10-CM | POA: Diagnosis not present

## 2019-01-29 DIAGNOSIS — N2581 Secondary hyperparathyroidism of renal origin: Secondary | ICD-10-CM | POA: Diagnosis not present

## 2019-01-30 ENCOUNTER — Encounter (HOSPITAL_BASED_OUTPATIENT_CLINIC_OR_DEPARTMENT_OTHER): Payer: Medicare Other | Attending: Internal Medicine

## 2019-01-30 DIAGNOSIS — E1140 Type 2 diabetes mellitus with diabetic neuropathy, unspecified: Secondary | ICD-10-CM | POA: Diagnosis not present

## 2019-01-30 DIAGNOSIS — L97822 Non-pressure chronic ulcer of other part of left lower leg with fat layer exposed: Secondary | ICD-10-CM | POA: Diagnosis not present

## 2019-01-30 DIAGNOSIS — E1151 Type 2 diabetes mellitus with diabetic peripheral angiopathy without gangrene: Secondary | ICD-10-CM | POA: Diagnosis not present

## 2019-01-30 DIAGNOSIS — E11622 Type 2 diabetes mellitus with other skin ulcer: Secondary | ICD-10-CM | POA: Diagnosis not present

## 2019-01-30 DIAGNOSIS — N185 Chronic kidney disease, stage 5: Secondary | ICD-10-CM | POA: Diagnosis not present

## 2019-01-30 DIAGNOSIS — Z89512 Acquired absence of left leg below knee: Secondary | ICD-10-CM | POA: Diagnosis not present

## 2019-01-30 DIAGNOSIS — I132 Hypertensive heart and chronic kidney disease with heart failure and with stage 5 chronic kidney disease, or end stage renal disease: Secondary | ICD-10-CM | POA: Insufficient documentation

## 2019-01-30 DIAGNOSIS — E1122 Type 2 diabetes mellitus with diabetic chronic kidney disease: Secondary | ICD-10-CM | POA: Insufficient documentation

## 2019-01-30 DIAGNOSIS — I509 Heart failure, unspecified: Secondary | ICD-10-CM | POA: Insufficient documentation

## 2019-01-31 DIAGNOSIS — N2581 Secondary hyperparathyroidism of renal origin: Secondary | ICD-10-CM | POA: Diagnosis not present

## 2019-01-31 DIAGNOSIS — N186 End stage renal disease: Secondary | ICD-10-CM | POA: Diagnosis not present

## 2019-01-31 DIAGNOSIS — E1121 Type 2 diabetes mellitus with diabetic nephropathy: Secondary | ICD-10-CM | POA: Diagnosis not present

## 2019-01-31 DIAGNOSIS — Z992 Dependence on renal dialysis: Secondary | ICD-10-CM | POA: Diagnosis not present

## 2019-02-01 DIAGNOSIS — I120 Hypertensive chronic kidney disease with stage 5 chronic kidney disease or end stage renal disease: Secondary | ICD-10-CM | POA: Diagnosis not present

## 2019-02-01 DIAGNOSIS — E11621 Type 2 diabetes mellitus with foot ulcer: Secondary | ICD-10-CM | POA: Diagnosis not present

## 2019-02-01 DIAGNOSIS — E1122 Type 2 diabetes mellitus with diabetic chronic kidney disease: Secondary | ICD-10-CM | POA: Diagnosis not present

## 2019-02-01 DIAGNOSIS — L97411 Non-pressure chronic ulcer of right heel and midfoot limited to breakdown of skin: Secondary | ICD-10-CM | POA: Diagnosis not present

## 2019-02-01 DIAGNOSIS — Z4781 Encounter for orthopedic aftercare following surgical amputation: Secondary | ICD-10-CM | POA: Diagnosis not present

## 2019-02-01 DIAGNOSIS — E1142 Type 2 diabetes mellitus with diabetic polyneuropathy: Secondary | ICD-10-CM | POA: Diagnosis not present

## 2019-02-02 ENCOUNTER — Telehealth: Payer: Self-pay | Admitting: *Deleted

## 2019-02-02 DIAGNOSIS — N186 End stage renal disease: Secondary | ICD-10-CM | POA: Diagnosis not present

## 2019-02-02 DIAGNOSIS — Z992 Dependence on renal dialysis: Secondary | ICD-10-CM | POA: Diagnosis not present

## 2019-02-02 DIAGNOSIS — E1121 Type 2 diabetes mellitus with diabetic nephropathy: Secondary | ICD-10-CM | POA: Diagnosis not present

## 2019-02-02 DIAGNOSIS — N2581 Secondary hyperparathyroidism of renal origin: Secondary | ICD-10-CM | POA: Diagnosis not present

## 2019-02-02 NOTE — Telephone Encounter (Signed)
Mickel Baas called for [facility] requesting 1wk6.  Approval given.

## 2019-02-03 DIAGNOSIS — Z48812 Encounter for surgical aftercare following surgery on the circulatory system: Secondary | ICD-10-CM | POA: Diagnosis not present

## 2019-02-03 DIAGNOSIS — D631 Anemia in chronic kidney disease: Secondary | ICD-10-CM | POA: Diagnosis not present

## 2019-02-03 DIAGNOSIS — I429 Cardiomyopathy, unspecified: Secondary | ICD-10-CM | POA: Diagnosis not present

## 2019-02-03 DIAGNOSIS — I120 Hypertensive chronic kidney disease with stage 5 chronic kidney disease or end stage renal disease: Secondary | ICD-10-CM | POA: Diagnosis not present

## 2019-02-03 DIAGNOSIS — E1136 Type 2 diabetes mellitus with diabetic cataract: Secondary | ICD-10-CM | POA: Diagnosis not present

## 2019-02-03 DIAGNOSIS — Z992 Dependence on renal dialysis: Secondary | ICD-10-CM | POA: Diagnosis not present

## 2019-02-03 DIAGNOSIS — Z959 Presence of cardiac and vascular implant and graft, unspecified: Secondary | ICD-10-CM | POA: Diagnosis not present

## 2019-02-03 DIAGNOSIS — Z89512 Acquired absence of left leg below knee: Secondary | ICD-10-CM | POA: Diagnosis not present

## 2019-02-03 DIAGNOSIS — E1142 Type 2 diabetes mellitus with diabetic polyneuropathy: Secondary | ICD-10-CM | POA: Diagnosis not present

## 2019-02-03 DIAGNOSIS — H409 Unspecified glaucoma: Secondary | ICD-10-CM | POA: Diagnosis not present

## 2019-02-03 DIAGNOSIS — Z4801 Encounter for change or removal of surgical wound dressing: Secondary | ICD-10-CM | POA: Diagnosis not present

## 2019-02-03 DIAGNOSIS — N186 End stage renal disease: Secondary | ICD-10-CM | POA: Diagnosis not present

## 2019-02-03 DIAGNOSIS — F17200 Nicotine dependence, unspecified, uncomplicated: Secondary | ICD-10-CM | POA: Diagnosis not present

## 2019-02-03 DIAGNOSIS — E1151 Type 2 diabetes mellitus with diabetic peripheral angiopathy without gangrene: Secondary | ICD-10-CM | POA: Diagnosis not present

## 2019-02-03 DIAGNOSIS — E1122 Type 2 diabetes mellitus with diabetic chronic kidney disease: Secondary | ICD-10-CM | POA: Diagnosis not present

## 2019-02-05 DIAGNOSIS — N186 End stage renal disease: Secondary | ICD-10-CM | POA: Diagnosis not present

## 2019-02-05 DIAGNOSIS — E1121 Type 2 diabetes mellitus with diabetic nephropathy: Secondary | ICD-10-CM | POA: Diagnosis not present

## 2019-02-05 DIAGNOSIS — N2581 Secondary hyperparathyroidism of renal origin: Secondary | ICD-10-CM | POA: Diagnosis not present

## 2019-02-05 DIAGNOSIS — Z992 Dependence on renal dialysis: Secondary | ICD-10-CM | POA: Diagnosis not present

## 2019-02-06 DIAGNOSIS — I509 Heart failure, unspecified: Secondary | ICD-10-CM | POA: Diagnosis not present

## 2019-02-06 DIAGNOSIS — E1122 Type 2 diabetes mellitus with diabetic chronic kidney disease: Secondary | ICD-10-CM | POA: Diagnosis not present

## 2019-02-06 DIAGNOSIS — L97822 Non-pressure chronic ulcer of other part of left lower leg with fat layer exposed: Secondary | ICD-10-CM | POA: Diagnosis not present

## 2019-02-06 DIAGNOSIS — I132 Hypertensive heart and chronic kidney disease with heart failure and with stage 5 chronic kidney disease, or end stage renal disease: Secondary | ICD-10-CM | POA: Diagnosis not present

## 2019-02-06 DIAGNOSIS — T8131XA Disruption of external operation (surgical) wound, not elsewhere classified, initial encounter: Secondary | ICD-10-CM | POA: Diagnosis not present

## 2019-02-06 DIAGNOSIS — E11622 Type 2 diabetes mellitus with other skin ulcer: Secondary | ICD-10-CM | POA: Diagnosis not present

## 2019-02-06 DIAGNOSIS — N185 Chronic kidney disease, stage 5: Secondary | ICD-10-CM | POA: Diagnosis not present

## 2019-02-07 DIAGNOSIS — E1121 Type 2 diabetes mellitus with diabetic nephropathy: Secondary | ICD-10-CM | POA: Diagnosis not present

## 2019-02-07 DIAGNOSIS — N2581 Secondary hyperparathyroidism of renal origin: Secondary | ICD-10-CM | POA: Diagnosis not present

## 2019-02-07 DIAGNOSIS — N186 End stage renal disease: Secondary | ICD-10-CM | POA: Diagnosis not present

## 2019-02-07 DIAGNOSIS — Z992 Dependence on renal dialysis: Secondary | ICD-10-CM | POA: Diagnosis not present

## 2019-02-08 DIAGNOSIS — E1142 Type 2 diabetes mellitus with diabetic polyneuropathy: Secondary | ICD-10-CM | POA: Diagnosis not present

## 2019-02-08 DIAGNOSIS — Z48812 Encounter for surgical aftercare following surgery on the circulatory system: Secondary | ICD-10-CM | POA: Diagnosis not present

## 2019-02-08 DIAGNOSIS — E1122 Type 2 diabetes mellitus with diabetic chronic kidney disease: Secondary | ICD-10-CM | POA: Diagnosis not present

## 2019-02-08 DIAGNOSIS — I120 Hypertensive chronic kidney disease with stage 5 chronic kidney disease or end stage renal disease: Secondary | ICD-10-CM | POA: Diagnosis not present

## 2019-02-08 DIAGNOSIS — Z4801 Encounter for change or removal of surgical wound dressing: Secondary | ICD-10-CM | POA: Diagnosis not present

## 2019-02-08 DIAGNOSIS — E1151 Type 2 diabetes mellitus with diabetic peripheral angiopathy without gangrene: Secondary | ICD-10-CM | POA: Diagnosis not present

## 2019-02-09 DIAGNOSIS — N2581 Secondary hyperparathyroidism of renal origin: Secondary | ICD-10-CM | POA: Diagnosis not present

## 2019-02-09 DIAGNOSIS — E1121 Type 2 diabetes mellitus with diabetic nephropathy: Secondary | ICD-10-CM | POA: Diagnosis not present

## 2019-02-09 DIAGNOSIS — N186 End stage renal disease: Secondary | ICD-10-CM | POA: Diagnosis not present

## 2019-02-09 DIAGNOSIS — Z992 Dependence on renal dialysis: Secondary | ICD-10-CM | POA: Diagnosis not present

## 2019-02-12 DIAGNOSIS — Z992 Dependence on renal dialysis: Secondary | ICD-10-CM | POA: Diagnosis not present

## 2019-02-12 DIAGNOSIS — N186 End stage renal disease: Secondary | ICD-10-CM | POA: Diagnosis not present

## 2019-02-12 DIAGNOSIS — E1121 Type 2 diabetes mellitus with diabetic nephropathy: Secondary | ICD-10-CM | POA: Diagnosis not present

## 2019-02-12 DIAGNOSIS — N2581 Secondary hyperparathyroidism of renal origin: Secondary | ICD-10-CM | POA: Diagnosis not present

## 2019-02-13 DIAGNOSIS — E11622 Type 2 diabetes mellitus with other skin ulcer: Secondary | ICD-10-CM | POA: Diagnosis not present

## 2019-02-13 DIAGNOSIS — L97822 Non-pressure chronic ulcer of other part of left lower leg with fat layer exposed: Secondary | ICD-10-CM | POA: Diagnosis not present

## 2019-02-13 DIAGNOSIS — G250 Essential tremor: Secondary | ICD-10-CM | POA: Diagnosis not present

## 2019-02-13 DIAGNOSIS — N185 Chronic kidney disease, stage 5: Secondary | ICD-10-CM | POA: Diagnosis not present

## 2019-02-13 DIAGNOSIS — I509 Heart failure, unspecified: Secondary | ICD-10-CM | POA: Diagnosis not present

## 2019-02-13 DIAGNOSIS — I10 Essential (primary) hypertension: Secondary | ICD-10-CM | POA: Diagnosis not present

## 2019-02-13 DIAGNOSIS — E1122 Type 2 diabetes mellitus with diabetic chronic kidney disease: Secondary | ICD-10-CM | POA: Diagnosis not present

## 2019-02-13 DIAGNOSIS — I132 Hypertensive heart and chronic kidney disease with heart failure and with stage 5 chronic kidney disease, or end stage renal disease: Secondary | ICD-10-CM | POA: Diagnosis not present

## 2019-02-13 DIAGNOSIS — E119 Type 2 diabetes mellitus without complications: Secondary | ICD-10-CM | POA: Diagnosis not present

## 2019-02-14 DIAGNOSIS — E1121 Type 2 diabetes mellitus with diabetic nephropathy: Secondary | ICD-10-CM | POA: Diagnosis not present

## 2019-02-14 DIAGNOSIS — N186 End stage renal disease: Secondary | ICD-10-CM | POA: Diagnosis not present

## 2019-02-14 DIAGNOSIS — Z992 Dependence on renal dialysis: Secondary | ICD-10-CM | POA: Diagnosis not present

## 2019-02-14 DIAGNOSIS — N2581 Secondary hyperparathyroidism of renal origin: Secondary | ICD-10-CM | POA: Diagnosis not present

## 2019-02-16 DIAGNOSIS — N2581 Secondary hyperparathyroidism of renal origin: Secondary | ICD-10-CM | POA: Diagnosis not present

## 2019-02-16 DIAGNOSIS — N186 End stage renal disease: Secondary | ICD-10-CM | POA: Diagnosis not present

## 2019-02-16 DIAGNOSIS — E1121 Type 2 diabetes mellitus with diabetic nephropathy: Secondary | ICD-10-CM | POA: Diagnosis not present

## 2019-02-16 DIAGNOSIS — Z992 Dependence on renal dialysis: Secondary | ICD-10-CM | POA: Diagnosis not present

## 2019-02-19 DIAGNOSIS — E1121 Type 2 diabetes mellitus with diabetic nephropathy: Secondary | ICD-10-CM | POA: Diagnosis not present

## 2019-02-19 DIAGNOSIS — N186 End stage renal disease: Secondary | ICD-10-CM | POA: Diagnosis not present

## 2019-02-19 DIAGNOSIS — N2581 Secondary hyperparathyroidism of renal origin: Secondary | ICD-10-CM | POA: Diagnosis not present

## 2019-02-19 DIAGNOSIS — Z992 Dependence on renal dialysis: Secondary | ICD-10-CM | POA: Diagnosis not present

## 2019-02-20 DIAGNOSIS — E1122 Type 2 diabetes mellitus with diabetic chronic kidney disease: Secondary | ICD-10-CM | POA: Diagnosis not present

## 2019-02-20 DIAGNOSIS — E11622 Type 2 diabetes mellitus with other skin ulcer: Secondary | ICD-10-CM | POA: Diagnosis not present

## 2019-02-20 DIAGNOSIS — T8131XA Disruption of external operation (surgical) wound, not elsewhere classified, initial encounter: Secondary | ICD-10-CM | POA: Diagnosis not present

## 2019-02-20 DIAGNOSIS — L97822 Non-pressure chronic ulcer of other part of left lower leg with fat layer exposed: Secondary | ICD-10-CM | POA: Diagnosis not present

## 2019-02-20 DIAGNOSIS — I132 Hypertensive heart and chronic kidney disease with heart failure and with stage 5 chronic kidney disease, or end stage renal disease: Secondary | ICD-10-CM | POA: Diagnosis not present

## 2019-02-20 DIAGNOSIS — I509 Heart failure, unspecified: Secondary | ICD-10-CM | POA: Diagnosis not present

## 2019-02-20 DIAGNOSIS — N185 Chronic kidney disease, stage 5: Secondary | ICD-10-CM | POA: Diagnosis not present

## 2019-02-21 DIAGNOSIS — Z992 Dependence on renal dialysis: Secondary | ICD-10-CM | POA: Diagnosis not present

## 2019-02-21 DIAGNOSIS — N186 End stage renal disease: Secondary | ICD-10-CM | POA: Diagnosis not present

## 2019-02-21 DIAGNOSIS — E1121 Type 2 diabetes mellitus with diabetic nephropathy: Secondary | ICD-10-CM | POA: Diagnosis not present

## 2019-02-21 DIAGNOSIS — N2581 Secondary hyperparathyroidism of renal origin: Secondary | ICD-10-CM | POA: Diagnosis not present

## 2019-02-23 DIAGNOSIS — N186 End stage renal disease: Secondary | ICD-10-CM | POA: Diagnosis not present

## 2019-02-23 DIAGNOSIS — N2581 Secondary hyperparathyroidism of renal origin: Secondary | ICD-10-CM | POA: Diagnosis not present

## 2019-02-23 DIAGNOSIS — E1121 Type 2 diabetes mellitus with diabetic nephropathy: Secondary | ICD-10-CM | POA: Diagnosis not present

## 2019-02-23 DIAGNOSIS — Z992 Dependence on renal dialysis: Secondary | ICD-10-CM | POA: Diagnosis not present

## 2019-02-25 DIAGNOSIS — N186 End stage renal disease: Secondary | ICD-10-CM | POA: Diagnosis not present

## 2019-02-25 DIAGNOSIS — E1122 Type 2 diabetes mellitus with diabetic chronic kidney disease: Secondary | ICD-10-CM | POA: Diagnosis not present

## 2019-02-25 DIAGNOSIS — Z992 Dependence on renal dialysis: Secondary | ICD-10-CM | POA: Diagnosis not present

## 2019-02-26 DIAGNOSIS — N2581 Secondary hyperparathyroidism of renal origin: Secondary | ICD-10-CM | POA: Diagnosis not present

## 2019-02-26 DIAGNOSIS — E1121 Type 2 diabetes mellitus with diabetic nephropathy: Secondary | ICD-10-CM | POA: Diagnosis not present

## 2019-02-26 DIAGNOSIS — Z992 Dependence on renal dialysis: Secondary | ICD-10-CM | POA: Diagnosis not present

## 2019-02-26 DIAGNOSIS — N186 End stage renal disease: Secondary | ICD-10-CM | POA: Diagnosis not present

## 2019-02-27 ENCOUNTER — Encounter (HOSPITAL_BASED_OUTPATIENT_CLINIC_OR_DEPARTMENT_OTHER): Payer: Medicare Other | Attending: Internal Medicine

## 2019-02-27 DIAGNOSIS — Z89512 Acquired absence of left leg below knee: Secondary | ICD-10-CM | POA: Diagnosis not present

## 2019-02-27 DIAGNOSIS — L97822 Non-pressure chronic ulcer of other part of left lower leg with fat layer exposed: Secondary | ICD-10-CM | POA: Diagnosis not present

## 2019-02-27 DIAGNOSIS — E1151 Type 2 diabetes mellitus with diabetic peripheral angiopathy without gangrene: Secondary | ICD-10-CM | POA: Insufficient documentation

## 2019-02-27 DIAGNOSIS — N185 Chronic kidney disease, stage 5: Secondary | ICD-10-CM | POA: Insufficient documentation

## 2019-02-27 DIAGNOSIS — E1122 Type 2 diabetes mellitus with diabetic chronic kidney disease: Secondary | ICD-10-CM | POA: Insufficient documentation

## 2019-02-27 DIAGNOSIS — I132 Hypertensive heart and chronic kidney disease with heart failure and with stage 5 chronic kidney disease, or end stage renal disease: Secondary | ICD-10-CM | POA: Insufficient documentation

## 2019-02-27 DIAGNOSIS — E1140 Type 2 diabetes mellitus with diabetic neuropathy, unspecified: Secondary | ICD-10-CM | POA: Insufficient documentation

## 2019-02-27 DIAGNOSIS — E11622 Type 2 diabetes mellitus with other skin ulcer: Secondary | ICD-10-CM | POA: Diagnosis not present

## 2019-02-27 DIAGNOSIS — I509 Heart failure, unspecified: Secondary | ICD-10-CM | POA: Diagnosis not present

## 2019-02-28 DIAGNOSIS — N2581 Secondary hyperparathyroidism of renal origin: Secondary | ICD-10-CM | POA: Diagnosis not present

## 2019-02-28 DIAGNOSIS — N186 End stage renal disease: Secondary | ICD-10-CM | POA: Diagnosis not present

## 2019-02-28 DIAGNOSIS — E1121 Type 2 diabetes mellitus with diabetic nephropathy: Secondary | ICD-10-CM | POA: Diagnosis not present

## 2019-02-28 DIAGNOSIS — Z992 Dependence on renal dialysis: Secondary | ICD-10-CM | POA: Diagnosis not present

## 2019-03-01 DIAGNOSIS — I120 Hypertensive chronic kidney disease with stage 5 chronic kidney disease or end stage renal disease: Secondary | ICD-10-CM | POA: Diagnosis not present

## 2019-03-01 DIAGNOSIS — E1142 Type 2 diabetes mellitus with diabetic polyneuropathy: Secondary | ICD-10-CM | POA: Diagnosis not present

## 2019-03-01 DIAGNOSIS — Z4801 Encounter for change or removal of surgical wound dressing: Secondary | ICD-10-CM | POA: Diagnosis not present

## 2019-03-01 DIAGNOSIS — Z48812 Encounter for surgical aftercare following surgery on the circulatory system: Secondary | ICD-10-CM | POA: Diagnosis not present

## 2019-03-01 DIAGNOSIS — E1151 Type 2 diabetes mellitus with diabetic peripheral angiopathy without gangrene: Secondary | ICD-10-CM | POA: Diagnosis not present

## 2019-03-01 DIAGNOSIS — E1122 Type 2 diabetes mellitus with diabetic chronic kidney disease: Secondary | ICD-10-CM | POA: Diagnosis not present

## 2019-03-02 DIAGNOSIS — N186 End stage renal disease: Secondary | ICD-10-CM | POA: Diagnosis not present

## 2019-03-02 DIAGNOSIS — E1121 Type 2 diabetes mellitus with diabetic nephropathy: Secondary | ICD-10-CM | POA: Diagnosis not present

## 2019-03-02 DIAGNOSIS — Z992 Dependence on renal dialysis: Secondary | ICD-10-CM | POA: Diagnosis not present

## 2019-03-02 DIAGNOSIS — N2581 Secondary hyperparathyroidism of renal origin: Secondary | ICD-10-CM | POA: Diagnosis not present

## 2019-03-05 DIAGNOSIS — N2581 Secondary hyperparathyroidism of renal origin: Secondary | ICD-10-CM | POA: Diagnosis not present

## 2019-03-05 DIAGNOSIS — E1142 Type 2 diabetes mellitus with diabetic polyneuropathy: Secondary | ICD-10-CM | POA: Diagnosis not present

## 2019-03-05 DIAGNOSIS — Z4801 Encounter for change or removal of surgical wound dressing: Secondary | ICD-10-CM | POA: Diagnosis not present

## 2019-03-05 DIAGNOSIS — D631 Anemia in chronic kidney disease: Secondary | ICD-10-CM | POA: Diagnosis not present

## 2019-03-05 DIAGNOSIS — Z959 Presence of cardiac and vascular implant and graft, unspecified: Secondary | ICD-10-CM | POA: Diagnosis not present

## 2019-03-05 DIAGNOSIS — H409 Unspecified glaucoma: Secondary | ICD-10-CM | POA: Diagnosis not present

## 2019-03-05 DIAGNOSIS — I120 Hypertensive chronic kidney disease with stage 5 chronic kidney disease or end stage renal disease: Secondary | ICD-10-CM | POA: Diagnosis not present

## 2019-03-05 DIAGNOSIS — E1136 Type 2 diabetes mellitus with diabetic cataract: Secondary | ICD-10-CM | POA: Diagnosis not present

## 2019-03-05 DIAGNOSIS — Z89512 Acquired absence of left leg below knee: Secondary | ICD-10-CM | POA: Diagnosis not present

## 2019-03-05 DIAGNOSIS — N186 End stage renal disease: Secondary | ICD-10-CM | POA: Diagnosis not present

## 2019-03-05 DIAGNOSIS — E1122 Type 2 diabetes mellitus with diabetic chronic kidney disease: Secondary | ICD-10-CM | POA: Diagnosis not present

## 2019-03-05 DIAGNOSIS — Z992 Dependence on renal dialysis: Secondary | ICD-10-CM | POA: Diagnosis not present

## 2019-03-05 DIAGNOSIS — Z48812 Encounter for surgical aftercare following surgery on the circulatory system: Secondary | ICD-10-CM | POA: Diagnosis not present

## 2019-03-05 DIAGNOSIS — E1151 Type 2 diabetes mellitus with diabetic peripheral angiopathy without gangrene: Secondary | ICD-10-CM | POA: Diagnosis not present

## 2019-03-05 DIAGNOSIS — I429 Cardiomyopathy, unspecified: Secondary | ICD-10-CM | POA: Diagnosis not present

## 2019-03-05 DIAGNOSIS — F17200 Nicotine dependence, unspecified, uncomplicated: Secondary | ICD-10-CM | POA: Diagnosis not present

## 2019-03-05 DIAGNOSIS — E1121 Type 2 diabetes mellitus with diabetic nephropathy: Secondary | ICD-10-CM | POA: Diagnosis not present

## 2019-03-06 DIAGNOSIS — I132 Hypertensive heart and chronic kidney disease with heart failure and with stage 5 chronic kidney disease, or end stage renal disease: Secondary | ICD-10-CM | POA: Diagnosis not present

## 2019-03-06 DIAGNOSIS — E1122 Type 2 diabetes mellitus with diabetic chronic kidney disease: Secondary | ICD-10-CM | POA: Diagnosis not present

## 2019-03-06 DIAGNOSIS — T8131XA Disruption of external operation (surgical) wound, not elsewhere classified, initial encounter: Secondary | ICD-10-CM | POA: Diagnosis not present

## 2019-03-06 DIAGNOSIS — I509 Heart failure, unspecified: Secondary | ICD-10-CM | POA: Diagnosis not present

## 2019-03-06 DIAGNOSIS — N185 Chronic kidney disease, stage 5: Secondary | ICD-10-CM | POA: Diagnosis not present

## 2019-03-06 DIAGNOSIS — L97822 Non-pressure chronic ulcer of other part of left lower leg with fat layer exposed: Secondary | ICD-10-CM | POA: Diagnosis not present

## 2019-03-06 DIAGNOSIS — E11622 Type 2 diabetes mellitus with other skin ulcer: Secondary | ICD-10-CM | POA: Diagnosis not present

## 2019-03-07 DIAGNOSIS — Z992 Dependence on renal dialysis: Secondary | ICD-10-CM | POA: Diagnosis not present

## 2019-03-07 DIAGNOSIS — N2581 Secondary hyperparathyroidism of renal origin: Secondary | ICD-10-CM | POA: Diagnosis not present

## 2019-03-07 DIAGNOSIS — N186 End stage renal disease: Secondary | ICD-10-CM | POA: Diagnosis not present

## 2019-03-07 DIAGNOSIS — E1121 Type 2 diabetes mellitus with diabetic nephropathy: Secondary | ICD-10-CM | POA: Diagnosis not present

## 2019-03-09 DIAGNOSIS — E1121 Type 2 diabetes mellitus with diabetic nephropathy: Secondary | ICD-10-CM | POA: Diagnosis not present

## 2019-03-09 DIAGNOSIS — Z992 Dependence on renal dialysis: Secondary | ICD-10-CM | POA: Diagnosis not present

## 2019-03-09 DIAGNOSIS — N2581 Secondary hyperparathyroidism of renal origin: Secondary | ICD-10-CM | POA: Diagnosis not present

## 2019-03-09 DIAGNOSIS — N186 End stage renal disease: Secondary | ICD-10-CM | POA: Diagnosis not present

## 2019-03-13 DIAGNOSIS — Z992 Dependence on renal dialysis: Secondary | ICD-10-CM | POA: Diagnosis not present

## 2019-03-13 DIAGNOSIS — L97822 Non-pressure chronic ulcer of other part of left lower leg with fat layer exposed: Secondary | ICD-10-CM | POA: Diagnosis not present

## 2019-03-13 DIAGNOSIS — I509 Heart failure, unspecified: Secondary | ICD-10-CM | POA: Diagnosis not present

## 2019-03-13 DIAGNOSIS — I132 Hypertensive heart and chronic kidney disease with heart failure and with stage 5 chronic kidney disease, or end stage renal disease: Secondary | ICD-10-CM | POA: Diagnosis not present

## 2019-03-13 DIAGNOSIS — N186 End stage renal disease: Secondary | ICD-10-CM | POA: Diagnosis not present

## 2019-03-13 DIAGNOSIS — E11622 Type 2 diabetes mellitus with other skin ulcer: Secondary | ICD-10-CM | POA: Diagnosis not present

## 2019-03-13 DIAGNOSIS — N2581 Secondary hyperparathyroidism of renal origin: Secondary | ICD-10-CM | POA: Diagnosis not present

## 2019-03-13 DIAGNOSIS — E1121 Type 2 diabetes mellitus with diabetic nephropathy: Secondary | ICD-10-CM | POA: Diagnosis not present

## 2019-03-13 DIAGNOSIS — N185 Chronic kidney disease, stage 5: Secondary | ICD-10-CM | POA: Diagnosis not present

## 2019-03-13 DIAGNOSIS — E1122 Type 2 diabetes mellitus with diabetic chronic kidney disease: Secondary | ICD-10-CM | POA: Diagnosis not present

## 2019-03-14 DIAGNOSIS — E1121 Type 2 diabetes mellitus with diabetic nephropathy: Secondary | ICD-10-CM | POA: Diagnosis not present

## 2019-03-14 DIAGNOSIS — N2581 Secondary hyperparathyroidism of renal origin: Secondary | ICD-10-CM | POA: Diagnosis not present

## 2019-03-14 DIAGNOSIS — Z992 Dependence on renal dialysis: Secondary | ICD-10-CM | POA: Diagnosis not present

## 2019-03-14 DIAGNOSIS — N186 End stage renal disease: Secondary | ICD-10-CM | POA: Diagnosis not present

## 2019-03-15 DIAGNOSIS — I120 Hypertensive chronic kidney disease with stage 5 chronic kidney disease or end stage renal disease: Secondary | ICD-10-CM | POA: Diagnosis not present

## 2019-03-15 DIAGNOSIS — E1151 Type 2 diabetes mellitus with diabetic peripheral angiopathy without gangrene: Secondary | ICD-10-CM | POA: Diagnosis not present

## 2019-03-15 DIAGNOSIS — Z48812 Encounter for surgical aftercare following surgery on the circulatory system: Secondary | ICD-10-CM | POA: Diagnosis not present

## 2019-03-15 DIAGNOSIS — E1142 Type 2 diabetes mellitus with diabetic polyneuropathy: Secondary | ICD-10-CM | POA: Diagnosis not present

## 2019-03-15 DIAGNOSIS — Z4801 Encounter for change or removal of surgical wound dressing: Secondary | ICD-10-CM | POA: Diagnosis not present

## 2019-03-15 DIAGNOSIS — E1122 Type 2 diabetes mellitus with diabetic chronic kidney disease: Secondary | ICD-10-CM | POA: Diagnosis not present

## 2019-03-16 DIAGNOSIS — N186 End stage renal disease: Secondary | ICD-10-CM | POA: Diagnosis not present

## 2019-03-16 DIAGNOSIS — N2581 Secondary hyperparathyroidism of renal origin: Secondary | ICD-10-CM | POA: Diagnosis not present

## 2019-03-16 DIAGNOSIS — Z992 Dependence on renal dialysis: Secondary | ICD-10-CM | POA: Diagnosis not present

## 2019-03-16 DIAGNOSIS — E1121 Type 2 diabetes mellitus with diabetic nephropathy: Secondary | ICD-10-CM | POA: Diagnosis not present

## 2019-03-19 DIAGNOSIS — E1121 Type 2 diabetes mellitus with diabetic nephropathy: Secondary | ICD-10-CM | POA: Diagnosis not present

## 2019-03-19 DIAGNOSIS — N186 End stage renal disease: Secondary | ICD-10-CM | POA: Diagnosis not present

## 2019-03-19 DIAGNOSIS — N2581 Secondary hyperparathyroidism of renal origin: Secondary | ICD-10-CM | POA: Diagnosis not present

## 2019-03-19 DIAGNOSIS — Z992 Dependence on renal dialysis: Secondary | ICD-10-CM | POA: Diagnosis not present

## 2019-03-20 DIAGNOSIS — I10 Essential (primary) hypertension: Secondary | ICD-10-CM | POA: Diagnosis not present

## 2019-03-20 DIAGNOSIS — G250 Essential tremor: Secondary | ICD-10-CM | POA: Diagnosis not present

## 2019-03-20 DIAGNOSIS — E11622 Type 2 diabetes mellitus with other skin ulcer: Secondary | ICD-10-CM | POA: Diagnosis not present

## 2019-03-20 DIAGNOSIS — I509 Heart failure, unspecified: Secondary | ICD-10-CM | POA: Diagnosis not present

## 2019-03-20 DIAGNOSIS — N185 Chronic kidney disease, stage 5: Secondary | ICD-10-CM | POA: Diagnosis not present

## 2019-03-20 DIAGNOSIS — E1122 Type 2 diabetes mellitus with diabetic chronic kidney disease: Secondary | ICD-10-CM | POA: Diagnosis not present

## 2019-03-20 DIAGNOSIS — L97822 Non-pressure chronic ulcer of other part of left lower leg with fat layer exposed: Secondary | ICD-10-CM | POA: Diagnosis not present

## 2019-03-20 DIAGNOSIS — T8131XA Disruption of external operation (surgical) wound, not elsewhere classified, initial encounter: Secondary | ICD-10-CM | POA: Diagnosis not present

## 2019-03-20 DIAGNOSIS — I132 Hypertensive heart and chronic kidney disease with heart failure and with stage 5 chronic kidney disease, or end stage renal disease: Secondary | ICD-10-CM | POA: Diagnosis not present

## 2019-03-20 DIAGNOSIS — E119 Type 2 diabetes mellitus without complications: Secondary | ICD-10-CM | POA: Diagnosis not present

## 2019-03-21 DIAGNOSIS — Z992 Dependence on renal dialysis: Secondary | ICD-10-CM | POA: Diagnosis not present

## 2019-03-21 DIAGNOSIS — E1121 Type 2 diabetes mellitus with diabetic nephropathy: Secondary | ICD-10-CM | POA: Diagnosis not present

## 2019-03-21 DIAGNOSIS — N186 End stage renal disease: Secondary | ICD-10-CM | POA: Diagnosis not present

## 2019-03-21 DIAGNOSIS — N2581 Secondary hyperparathyroidism of renal origin: Secondary | ICD-10-CM | POA: Diagnosis not present

## 2019-03-23 DIAGNOSIS — N2581 Secondary hyperparathyroidism of renal origin: Secondary | ICD-10-CM | POA: Diagnosis not present

## 2019-03-23 DIAGNOSIS — Z992 Dependence on renal dialysis: Secondary | ICD-10-CM | POA: Diagnosis not present

## 2019-03-23 DIAGNOSIS — N186 End stage renal disease: Secondary | ICD-10-CM | POA: Diagnosis not present

## 2019-03-23 DIAGNOSIS — E1121 Type 2 diabetes mellitus with diabetic nephropathy: Secondary | ICD-10-CM | POA: Diagnosis not present

## 2019-03-26 DIAGNOSIS — E1121 Type 2 diabetes mellitus with diabetic nephropathy: Secondary | ICD-10-CM | POA: Diagnosis not present

## 2019-03-26 DIAGNOSIS — N186 End stage renal disease: Secondary | ICD-10-CM | POA: Diagnosis not present

## 2019-03-26 DIAGNOSIS — Z992 Dependence on renal dialysis: Secondary | ICD-10-CM | POA: Diagnosis not present

## 2019-03-26 DIAGNOSIS — N2581 Secondary hyperparathyroidism of renal origin: Secondary | ICD-10-CM | POA: Diagnosis not present

## 2019-03-27 DIAGNOSIS — I132 Hypertensive heart and chronic kidney disease with heart failure and with stage 5 chronic kidney disease, or end stage renal disease: Secondary | ICD-10-CM | POA: Diagnosis not present

## 2019-03-27 DIAGNOSIS — I509 Heart failure, unspecified: Secondary | ICD-10-CM | POA: Diagnosis not present

## 2019-03-27 DIAGNOSIS — E1122 Type 2 diabetes mellitus with diabetic chronic kidney disease: Secondary | ICD-10-CM | POA: Diagnosis not present

## 2019-03-27 DIAGNOSIS — N185 Chronic kidney disease, stage 5: Secondary | ICD-10-CM | POA: Diagnosis not present

## 2019-03-27 DIAGNOSIS — E11622 Type 2 diabetes mellitus with other skin ulcer: Secondary | ICD-10-CM | POA: Diagnosis not present

## 2019-03-27 DIAGNOSIS — L97822 Non-pressure chronic ulcer of other part of left lower leg with fat layer exposed: Secondary | ICD-10-CM | POA: Diagnosis not present

## 2019-03-28 DIAGNOSIS — N2581 Secondary hyperparathyroidism of renal origin: Secondary | ICD-10-CM | POA: Diagnosis not present

## 2019-03-28 DIAGNOSIS — E875 Hyperkalemia: Secondary | ICD-10-CM | POA: Diagnosis not present

## 2019-03-28 DIAGNOSIS — Z992 Dependence on renal dialysis: Secondary | ICD-10-CM | POA: Diagnosis not present

## 2019-03-28 DIAGNOSIS — E1122 Type 2 diabetes mellitus with diabetic chronic kidney disease: Secondary | ICD-10-CM | POA: Diagnosis not present

## 2019-03-28 DIAGNOSIS — E1121 Type 2 diabetes mellitus with diabetic nephropathy: Secondary | ICD-10-CM | POA: Diagnosis not present

## 2019-03-28 DIAGNOSIS — N186 End stage renal disease: Secondary | ICD-10-CM | POA: Diagnosis not present

## 2019-03-30 DIAGNOSIS — N186 End stage renal disease: Secondary | ICD-10-CM | POA: Diagnosis not present

## 2019-03-30 DIAGNOSIS — E875 Hyperkalemia: Secondary | ICD-10-CM | POA: Diagnosis not present

## 2019-03-30 DIAGNOSIS — N2581 Secondary hyperparathyroidism of renal origin: Secondary | ICD-10-CM | POA: Diagnosis not present

## 2019-03-30 DIAGNOSIS — Z992 Dependence on renal dialysis: Secondary | ICD-10-CM | POA: Diagnosis not present

## 2019-03-30 DIAGNOSIS — E1121 Type 2 diabetes mellitus with diabetic nephropathy: Secondary | ICD-10-CM | POA: Diagnosis not present

## 2019-04-02 DIAGNOSIS — N2581 Secondary hyperparathyroidism of renal origin: Secondary | ICD-10-CM | POA: Diagnosis not present

## 2019-04-02 DIAGNOSIS — N186 End stage renal disease: Secondary | ICD-10-CM | POA: Diagnosis not present

## 2019-04-02 DIAGNOSIS — E875 Hyperkalemia: Secondary | ICD-10-CM | POA: Diagnosis not present

## 2019-04-02 DIAGNOSIS — E1121 Type 2 diabetes mellitus with diabetic nephropathy: Secondary | ICD-10-CM | POA: Diagnosis not present

## 2019-04-02 DIAGNOSIS — Z992 Dependence on renal dialysis: Secondary | ICD-10-CM | POA: Diagnosis not present

## 2019-04-03 ENCOUNTER — Encounter (HOSPITAL_BASED_OUTPATIENT_CLINIC_OR_DEPARTMENT_OTHER): Payer: Medicare Other | Attending: Internal Medicine

## 2019-04-03 DIAGNOSIS — T8189XA Other complications of procedures, not elsewhere classified, initial encounter: Secondary | ICD-10-CM | POA: Diagnosis not present

## 2019-04-03 DIAGNOSIS — Z09 Encounter for follow-up examination after completed treatment for conditions other than malignant neoplasm: Secondary | ICD-10-CM | POA: Insufficient documentation

## 2019-04-03 DIAGNOSIS — I130 Hypertensive heart and chronic kidney disease with heart failure and stage 1 through stage 4 chronic kidney disease, or unspecified chronic kidney disease: Secondary | ICD-10-CM | POA: Diagnosis not present

## 2019-04-03 DIAGNOSIS — Z872 Personal history of diseases of the skin and subcutaneous tissue: Secondary | ICD-10-CM | POA: Insufficient documentation

## 2019-04-03 DIAGNOSIS — E1122 Type 2 diabetes mellitus with diabetic chronic kidney disease: Secondary | ICD-10-CM | POA: Diagnosis not present

## 2019-04-03 DIAGNOSIS — N189 Chronic kidney disease, unspecified: Secondary | ICD-10-CM | POA: Insufficient documentation

## 2019-04-03 DIAGNOSIS — Z89512 Acquired absence of left leg below knee: Secondary | ICD-10-CM | POA: Diagnosis not present

## 2019-04-03 DIAGNOSIS — E1140 Type 2 diabetes mellitus with diabetic neuropathy, unspecified: Secondary | ICD-10-CM | POA: Diagnosis not present

## 2019-04-04 DIAGNOSIS — Z992 Dependence on renal dialysis: Secondary | ICD-10-CM | POA: Diagnosis not present

## 2019-04-04 DIAGNOSIS — N186 End stage renal disease: Secondary | ICD-10-CM | POA: Diagnosis not present

## 2019-04-04 DIAGNOSIS — E1121 Type 2 diabetes mellitus with diabetic nephropathy: Secondary | ICD-10-CM | POA: Diagnosis not present

## 2019-04-04 DIAGNOSIS — E875 Hyperkalemia: Secondary | ICD-10-CM | POA: Diagnosis not present

## 2019-04-04 DIAGNOSIS — N2581 Secondary hyperparathyroidism of renal origin: Secondary | ICD-10-CM | POA: Diagnosis not present

## 2019-04-06 DIAGNOSIS — N186 End stage renal disease: Secondary | ICD-10-CM | POA: Diagnosis not present

## 2019-04-06 DIAGNOSIS — Z992 Dependence on renal dialysis: Secondary | ICD-10-CM | POA: Diagnosis not present

## 2019-04-06 DIAGNOSIS — N2581 Secondary hyperparathyroidism of renal origin: Secondary | ICD-10-CM | POA: Diagnosis not present

## 2019-04-06 DIAGNOSIS — E1121 Type 2 diabetes mellitus with diabetic nephropathy: Secondary | ICD-10-CM | POA: Diagnosis not present

## 2019-04-06 DIAGNOSIS — E875 Hyperkalemia: Secondary | ICD-10-CM | POA: Diagnosis not present

## 2019-04-09 DIAGNOSIS — E1121 Type 2 diabetes mellitus with diabetic nephropathy: Secondary | ICD-10-CM | POA: Diagnosis not present

## 2019-04-09 DIAGNOSIS — E875 Hyperkalemia: Secondary | ICD-10-CM | POA: Diagnosis not present

## 2019-04-09 DIAGNOSIS — N2581 Secondary hyperparathyroidism of renal origin: Secondary | ICD-10-CM | POA: Diagnosis not present

## 2019-04-09 DIAGNOSIS — Z992 Dependence on renal dialysis: Secondary | ICD-10-CM | POA: Diagnosis not present

## 2019-04-09 DIAGNOSIS — N186 End stage renal disease: Secondary | ICD-10-CM | POA: Diagnosis not present

## 2019-04-11 DIAGNOSIS — N186 End stage renal disease: Secondary | ICD-10-CM | POA: Diagnosis not present

## 2019-04-11 DIAGNOSIS — E875 Hyperkalemia: Secondary | ICD-10-CM | POA: Diagnosis not present

## 2019-04-11 DIAGNOSIS — N2581 Secondary hyperparathyroidism of renal origin: Secondary | ICD-10-CM | POA: Diagnosis not present

## 2019-04-11 DIAGNOSIS — E1121 Type 2 diabetes mellitus with diabetic nephropathy: Secondary | ICD-10-CM | POA: Diagnosis not present

## 2019-04-11 DIAGNOSIS — Z992 Dependence on renal dialysis: Secondary | ICD-10-CM | POA: Diagnosis not present

## 2019-04-13 DIAGNOSIS — E875 Hyperkalemia: Secondary | ICD-10-CM | POA: Diagnosis not present

## 2019-04-13 DIAGNOSIS — N2581 Secondary hyperparathyroidism of renal origin: Secondary | ICD-10-CM | POA: Diagnosis not present

## 2019-04-13 DIAGNOSIS — E1121 Type 2 diabetes mellitus with diabetic nephropathy: Secondary | ICD-10-CM | POA: Diagnosis not present

## 2019-04-13 DIAGNOSIS — Z992 Dependence on renal dialysis: Secondary | ICD-10-CM | POA: Diagnosis not present

## 2019-04-13 DIAGNOSIS — N186 End stage renal disease: Secondary | ICD-10-CM | POA: Diagnosis not present

## 2019-04-16 DIAGNOSIS — Z992 Dependence on renal dialysis: Secondary | ICD-10-CM | POA: Diagnosis not present

## 2019-04-16 DIAGNOSIS — E1121 Type 2 diabetes mellitus with diabetic nephropathy: Secondary | ICD-10-CM | POA: Diagnosis not present

## 2019-04-16 DIAGNOSIS — N186 End stage renal disease: Secondary | ICD-10-CM | POA: Diagnosis not present

## 2019-04-16 DIAGNOSIS — N2581 Secondary hyperparathyroidism of renal origin: Secondary | ICD-10-CM | POA: Diagnosis not present

## 2019-04-16 DIAGNOSIS — E875 Hyperkalemia: Secondary | ICD-10-CM | POA: Diagnosis not present

## 2019-04-18 DIAGNOSIS — N2581 Secondary hyperparathyroidism of renal origin: Secondary | ICD-10-CM | POA: Diagnosis not present

## 2019-04-18 DIAGNOSIS — Z992 Dependence on renal dialysis: Secondary | ICD-10-CM | POA: Diagnosis not present

## 2019-04-18 DIAGNOSIS — N186 End stage renal disease: Secondary | ICD-10-CM | POA: Diagnosis not present

## 2019-04-18 DIAGNOSIS — E1121 Type 2 diabetes mellitus with diabetic nephropathy: Secondary | ICD-10-CM | POA: Diagnosis not present

## 2019-04-18 DIAGNOSIS — E875 Hyperkalemia: Secondary | ICD-10-CM | POA: Diagnosis not present

## 2019-04-19 DIAGNOSIS — L989 Disorder of the skin and subcutaneous tissue, unspecified: Secondary | ICD-10-CM | POA: Diagnosis not present

## 2019-04-19 DIAGNOSIS — M79645 Pain in left finger(s): Secondary | ICD-10-CM | POA: Diagnosis not present

## 2019-04-20 DIAGNOSIS — E1121 Type 2 diabetes mellitus with diabetic nephropathy: Secondary | ICD-10-CM | POA: Diagnosis not present

## 2019-04-20 DIAGNOSIS — N2581 Secondary hyperparathyroidism of renal origin: Secondary | ICD-10-CM | POA: Diagnosis not present

## 2019-04-20 DIAGNOSIS — Z992 Dependence on renal dialysis: Secondary | ICD-10-CM | POA: Diagnosis not present

## 2019-04-20 DIAGNOSIS — N186 End stage renal disease: Secondary | ICD-10-CM | POA: Diagnosis not present

## 2019-04-20 DIAGNOSIS — E875 Hyperkalemia: Secondary | ICD-10-CM | POA: Diagnosis not present

## 2019-04-23 DIAGNOSIS — E875 Hyperkalemia: Secondary | ICD-10-CM | POA: Diagnosis not present

## 2019-04-23 DIAGNOSIS — E1121 Type 2 diabetes mellitus with diabetic nephropathy: Secondary | ICD-10-CM | POA: Diagnosis not present

## 2019-04-23 DIAGNOSIS — Z992 Dependence on renal dialysis: Secondary | ICD-10-CM | POA: Diagnosis not present

## 2019-04-23 DIAGNOSIS — N186 End stage renal disease: Secondary | ICD-10-CM | POA: Diagnosis not present

## 2019-04-23 DIAGNOSIS — N2581 Secondary hyperparathyroidism of renal origin: Secondary | ICD-10-CM | POA: Diagnosis not present

## 2019-04-24 DIAGNOSIS — M79644 Pain in right finger(s): Secondary | ICD-10-CM | POA: Diagnosis not present

## 2019-04-24 DIAGNOSIS — M79645 Pain in left finger(s): Secondary | ICD-10-CM | POA: Diagnosis not present

## 2019-04-25 DIAGNOSIS — E875 Hyperkalemia: Secondary | ICD-10-CM | POA: Diagnosis not present

## 2019-04-25 DIAGNOSIS — E1121 Type 2 diabetes mellitus with diabetic nephropathy: Secondary | ICD-10-CM | POA: Diagnosis not present

## 2019-04-25 DIAGNOSIS — N186 End stage renal disease: Secondary | ICD-10-CM | POA: Diagnosis not present

## 2019-04-25 DIAGNOSIS — Z992 Dependence on renal dialysis: Secondary | ICD-10-CM | POA: Diagnosis not present

## 2019-04-25 DIAGNOSIS — N2581 Secondary hyperparathyroidism of renal origin: Secondary | ICD-10-CM | POA: Diagnosis not present

## 2019-04-27 DIAGNOSIS — E1122 Type 2 diabetes mellitus with diabetic chronic kidney disease: Secondary | ICD-10-CM | POA: Diagnosis not present

## 2019-04-27 DIAGNOSIS — N186 End stage renal disease: Secondary | ICD-10-CM | POA: Diagnosis not present

## 2019-04-27 DIAGNOSIS — E1121 Type 2 diabetes mellitus with diabetic nephropathy: Secondary | ICD-10-CM | POA: Diagnosis not present

## 2019-04-27 DIAGNOSIS — Z992 Dependence on renal dialysis: Secondary | ICD-10-CM | POA: Diagnosis not present

## 2019-04-27 DIAGNOSIS — N2581 Secondary hyperparathyroidism of renal origin: Secondary | ICD-10-CM | POA: Diagnosis not present

## 2019-04-30 DIAGNOSIS — N186 End stage renal disease: Secondary | ICD-10-CM | POA: Diagnosis not present

## 2019-04-30 DIAGNOSIS — N2581 Secondary hyperparathyroidism of renal origin: Secondary | ICD-10-CM | POA: Diagnosis not present

## 2019-04-30 DIAGNOSIS — E1121 Type 2 diabetes mellitus with diabetic nephropathy: Secondary | ICD-10-CM | POA: Diagnosis not present

## 2019-04-30 DIAGNOSIS — Z992 Dependence on renal dialysis: Secondary | ICD-10-CM | POA: Diagnosis not present

## 2019-05-02 DIAGNOSIS — N186 End stage renal disease: Secondary | ICD-10-CM | POA: Diagnosis not present

## 2019-05-02 DIAGNOSIS — Z992 Dependence on renal dialysis: Secondary | ICD-10-CM | POA: Diagnosis not present

## 2019-05-02 DIAGNOSIS — E1121 Type 2 diabetes mellitus with diabetic nephropathy: Secondary | ICD-10-CM | POA: Diagnosis not present

## 2019-05-02 DIAGNOSIS — N2581 Secondary hyperparathyroidism of renal origin: Secondary | ICD-10-CM | POA: Diagnosis not present

## 2019-05-04 DIAGNOSIS — N186 End stage renal disease: Secondary | ICD-10-CM | POA: Diagnosis not present

## 2019-05-04 DIAGNOSIS — E1121 Type 2 diabetes mellitus with diabetic nephropathy: Secondary | ICD-10-CM | POA: Diagnosis not present

## 2019-05-04 DIAGNOSIS — N2581 Secondary hyperparathyroidism of renal origin: Secondary | ICD-10-CM | POA: Diagnosis not present

## 2019-05-04 DIAGNOSIS — Z992 Dependence on renal dialysis: Secondary | ICD-10-CM | POA: Diagnosis not present

## 2019-05-07 DIAGNOSIS — E1121 Type 2 diabetes mellitus with diabetic nephropathy: Secondary | ICD-10-CM | POA: Diagnosis not present

## 2019-05-07 DIAGNOSIS — Z992 Dependence on renal dialysis: Secondary | ICD-10-CM | POA: Diagnosis not present

## 2019-05-07 DIAGNOSIS — N2581 Secondary hyperparathyroidism of renal origin: Secondary | ICD-10-CM | POA: Diagnosis not present

## 2019-05-07 DIAGNOSIS — N186 End stage renal disease: Secondary | ICD-10-CM | POA: Diagnosis not present

## 2019-05-09 DIAGNOSIS — E1121 Type 2 diabetes mellitus with diabetic nephropathy: Secondary | ICD-10-CM | POA: Diagnosis not present

## 2019-05-09 DIAGNOSIS — N2581 Secondary hyperparathyroidism of renal origin: Secondary | ICD-10-CM | POA: Diagnosis not present

## 2019-05-09 DIAGNOSIS — Z992 Dependence on renal dialysis: Secondary | ICD-10-CM | POA: Diagnosis not present

## 2019-05-09 DIAGNOSIS — N186 End stage renal disease: Secondary | ICD-10-CM | POA: Diagnosis not present

## 2019-05-11 DIAGNOSIS — E1121 Type 2 diabetes mellitus with diabetic nephropathy: Secondary | ICD-10-CM | POA: Diagnosis not present

## 2019-05-11 DIAGNOSIS — N2581 Secondary hyperparathyroidism of renal origin: Secondary | ICD-10-CM | POA: Diagnosis not present

## 2019-05-11 DIAGNOSIS — Z992 Dependence on renal dialysis: Secondary | ICD-10-CM | POA: Diagnosis not present

## 2019-05-11 DIAGNOSIS — N186 End stage renal disease: Secondary | ICD-10-CM | POA: Diagnosis not present

## 2019-05-14 DIAGNOSIS — E1121 Type 2 diabetes mellitus with diabetic nephropathy: Secondary | ICD-10-CM | POA: Diagnosis not present

## 2019-05-14 DIAGNOSIS — Z992 Dependence on renal dialysis: Secondary | ICD-10-CM | POA: Diagnosis not present

## 2019-05-14 DIAGNOSIS — N186 End stage renal disease: Secondary | ICD-10-CM | POA: Diagnosis not present

## 2019-05-14 DIAGNOSIS — N2581 Secondary hyperparathyroidism of renal origin: Secondary | ICD-10-CM | POA: Diagnosis not present

## 2019-05-16 DIAGNOSIS — Z992 Dependence on renal dialysis: Secondary | ICD-10-CM | POA: Diagnosis not present

## 2019-05-16 DIAGNOSIS — N186 End stage renal disease: Secondary | ICD-10-CM | POA: Diagnosis not present

## 2019-05-16 DIAGNOSIS — N2581 Secondary hyperparathyroidism of renal origin: Secondary | ICD-10-CM | POA: Diagnosis not present

## 2019-05-16 DIAGNOSIS — E1121 Type 2 diabetes mellitus with diabetic nephropathy: Secondary | ICD-10-CM | POA: Diagnosis not present

## 2019-05-17 DIAGNOSIS — N186 End stage renal disease: Secondary | ICD-10-CM | POA: Diagnosis not present

## 2019-05-17 DIAGNOSIS — E1122 Type 2 diabetes mellitus with diabetic chronic kidney disease: Secondary | ICD-10-CM | POA: Diagnosis not present

## 2019-05-17 DIAGNOSIS — M79644 Pain in right finger(s): Secondary | ICD-10-CM | POA: Diagnosis not present

## 2019-05-17 DIAGNOSIS — M79645 Pain in left finger(s): Secondary | ICD-10-CM | POA: Diagnosis not present

## 2019-05-17 DIAGNOSIS — Z4781 Encounter for orthopedic aftercare following surgical amputation: Secondary | ICD-10-CM | POA: Diagnosis not present

## 2019-05-17 DIAGNOSIS — Z794 Long term (current) use of insulin: Secondary | ICD-10-CM | POA: Diagnosis not present

## 2019-05-17 DIAGNOSIS — Z89512 Acquired absence of left leg below knee: Secondary | ICD-10-CM | POA: Diagnosis not present

## 2019-05-17 DIAGNOSIS — Z992 Dependence on renal dialysis: Secondary | ICD-10-CM | POA: Diagnosis not present

## 2019-05-18 DIAGNOSIS — N2581 Secondary hyperparathyroidism of renal origin: Secondary | ICD-10-CM | POA: Diagnosis not present

## 2019-05-18 DIAGNOSIS — N186 End stage renal disease: Secondary | ICD-10-CM | POA: Diagnosis not present

## 2019-05-18 DIAGNOSIS — Z992 Dependence on renal dialysis: Secondary | ICD-10-CM | POA: Diagnosis not present

## 2019-05-18 DIAGNOSIS — E1121 Type 2 diabetes mellitus with diabetic nephropathy: Secondary | ICD-10-CM | POA: Diagnosis not present

## 2019-05-21 DIAGNOSIS — N2581 Secondary hyperparathyroidism of renal origin: Secondary | ICD-10-CM | POA: Diagnosis not present

## 2019-05-21 DIAGNOSIS — E1121 Type 2 diabetes mellitus with diabetic nephropathy: Secondary | ICD-10-CM | POA: Diagnosis not present

## 2019-05-21 DIAGNOSIS — Z992 Dependence on renal dialysis: Secondary | ICD-10-CM | POA: Diagnosis not present

## 2019-05-21 DIAGNOSIS — N186 End stage renal disease: Secondary | ICD-10-CM | POA: Diagnosis not present

## 2019-05-22 DIAGNOSIS — Z4781 Encounter for orthopedic aftercare following surgical amputation: Secondary | ICD-10-CM | POA: Diagnosis not present

## 2019-05-22 DIAGNOSIS — Z89512 Acquired absence of left leg below knee: Secondary | ICD-10-CM | POA: Diagnosis not present

## 2019-05-22 DIAGNOSIS — M79644 Pain in right finger(s): Secondary | ICD-10-CM | POA: Diagnosis not present

## 2019-05-22 DIAGNOSIS — M79645 Pain in left finger(s): Secondary | ICD-10-CM | POA: Diagnosis not present

## 2019-05-22 DIAGNOSIS — N186 End stage renal disease: Secondary | ICD-10-CM | POA: Diagnosis not present

## 2019-05-22 DIAGNOSIS — E1122 Type 2 diabetes mellitus with diabetic chronic kidney disease: Secondary | ICD-10-CM | POA: Diagnosis not present

## 2019-05-23 DIAGNOSIS — Z992 Dependence on renal dialysis: Secondary | ICD-10-CM | POA: Diagnosis not present

## 2019-05-23 DIAGNOSIS — E1121 Type 2 diabetes mellitus with diabetic nephropathy: Secondary | ICD-10-CM | POA: Diagnosis not present

## 2019-05-23 DIAGNOSIS — N2581 Secondary hyperparathyroidism of renal origin: Secondary | ICD-10-CM | POA: Diagnosis not present

## 2019-05-23 DIAGNOSIS — N186 End stage renal disease: Secondary | ICD-10-CM | POA: Diagnosis not present

## 2019-05-24 DIAGNOSIS — E1122 Type 2 diabetes mellitus with diabetic chronic kidney disease: Secondary | ICD-10-CM | POA: Diagnosis not present

## 2019-05-24 DIAGNOSIS — M79644 Pain in right finger(s): Secondary | ICD-10-CM | POA: Diagnosis not present

## 2019-05-24 DIAGNOSIS — Z4781 Encounter for orthopedic aftercare following surgical amputation: Secondary | ICD-10-CM | POA: Diagnosis not present

## 2019-05-24 DIAGNOSIS — N186 End stage renal disease: Secondary | ICD-10-CM | POA: Diagnosis not present

## 2019-05-24 DIAGNOSIS — M79645 Pain in left finger(s): Secondary | ICD-10-CM | POA: Diagnosis not present

## 2019-05-24 DIAGNOSIS — Z89512 Acquired absence of left leg below knee: Secondary | ICD-10-CM | POA: Diagnosis not present

## 2019-05-25 DIAGNOSIS — E1121 Type 2 diabetes mellitus with diabetic nephropathy: Secondary | ICD-10-CM | POA: Diagnosis not present

## 2019-05-25 DIAGNOSIS — N186 End stage renal disease: Secondary | ICD-10-CM | POA: Diagnosis not present

## 2019-05-25 DIAGNOSIS — Z992 Dependence on renal dialysis: Secondary | ICD-10-CM | POA: Diagnosis not present

## 2019-05-25 DIAGNOSIS — N2581 Secondary hyperparathyroidism of renal origin: Secondary | ICD-10-CM | POA: Diagnosis not present

## 2019-05-28 DIAGNOSIS — E1122 Type 2 diabetes mellitus with diabetic chronic kidney disease: Secondary | ICD-10-CM | POA: Diagnosis not present

## 2019-05-28 DIAGNOSIS — E1121 Type 2 diabetes mellitus with diabetic nephropathy: Secondary | ICD-10-CM | POA: Diagnosis not present

## 2019-05-28 DIAGNOSIS — Z992 Dependence on renal dialysis: Secondary | ICD-10-CM | POA: Diagnosis not present

## 2019-05-28 DIAGNOSIS — N2581 Secondary hyperparathyroidism of renal origin: Secondary | ICD-10-CM | POA: Diagnosis not present

## 2019-05-28 DIAGNOSIS — N186 End stage renal disease: Secondary | ICD-10-CM | POA: Diagnosis not present

## 2019-05-29 DIAGNOSIS — E119 Type 2 diabetes mellitus without complications: Secondary | ICD-10-CM | POA: Diagnosis not present

## 2019-05-29 DIAGNOSIS — G250 Essential tremor: Secondary | ICD-10-CM | POA: Diagnosis not present

## 2019-05-29 DIAGNOSIS — Z1159 Encounter for screening for other viral diseases: Secondary | ICD-10-CM | POA: Diagnosis not present

## 2019-05-29 DIAGNOSIS — M79645 Pain in left finger(s): Secondary | ICD-10-CM | POA: Diagnosis not present

## 2019-05-29 DIAGNOSIS — L989 Disorder of the skin and subcutaneous tissue, unspecified: Secondary | ICD-10-CM | POA: Diagnosis not present

## 2019-05-30 DIAGNOSIS — Z992 Dependence on renal dialysis: Secondary | ICD-10-CM | POA: Diagnosis not present

## 2019-05-30 DIAGNOSIS — N2581 Secondary hyperparathyroidism of renal origin: Secondary | ICD-10-CM | POA: Diagnosis not present

## 2019-05-30 DIAGNOSIS — E1121 Type 2 diabetes mellitus with diabetic nephropathy: Secondary | ICD-10-CM | POA: Diagnosis not present

## 2019-05-30 DIAGNOSIS — N186 End stage renal disease: Secondary | ICD-10-CM | POA: Diagnosis not present

## 2019-06-01 DIAGNOSIS — Z992 Dependence on renal dialysis: Secondary | ICD-10-CM | POA: Diagnosis not present

## 2019-06-01 DIAGNOSIS — E1121 Type 2 diabetes mellitus with diabetic nephropathy: Secondary | ICD-10-CM | POA: Diagnosis not present

## 2019-06-01 DIAGNOSIS — N2581 Secondary hyperparathyroidism of renal origin: Secondary | ICD-10-CM | POA: Diagnosis not present

## 2019-06-01 DIAGNOSIS — N186 End stage renal disease: Secondary | ICD-10-CM | POA: Diagnosis not present

## 2019-06-04 DIAGNOSIS — E1121 Type 2 diabetes mellitus with diabetic nephropathy: Secondary | ICD-10-CM | POA: Diagnosis not present

## 2019-06-04 DIAGNOSIS — N186 End stage renal disease: Secondary | ICD-10-CM | POA: Diagnosis not present

## 2019-06-04 DIAGNOSIS — N2581 Secondary hyperparathyroidism of renal origin: Secondary | ICD-10-CM | POA: Diagnosis not present

## 2019-06-04 DIAGNOSIS — Z992 Dependence on renal dialysis: Secondary | ICD-10-CM | POA: Diagnosis not present

## 2019-06-05 DIAGNOSIS — Z89512 Acquired absence of left leg below knee: Secondary | ICD-10-CM | POA: Diagnosis not present

## 2019-06-05 DIAGNOSIS — M79644 Pain in right finger(s): Secondary | ICD-10-CM | POA: Diagnosis not present

## 2019-06-05 DIAGNOSIS — Z4781 Encounter for orthopedic aftercare following surgical amputation: Secondary | ICD-10-CM | POA: Diagnosis not present

## 2019-06-05 DIAGNOSIS — M79645 Pain in left finger(s): Secondary | ICD-10-CM | POA: Diagnosis not present

## 2019-06-05 DIAGNOSIS — N186 End stage renal disease: Secondary | ICD-10-CM | POA: Diagnosis not present

## 2019-06-05 DIAGNOSIS — E1122 Type 2 diabetes mellitus with diabetic chronic kidney disease: Secondary | ICD-10-CM | POA: Diagnosis not present

## 2019-06-06 ENCOUNTER — Encounter: Payer: Self-pay | Admitting: *Deleted

## 2019-06-06 ENCOUNTER — Other Ambulatory Visit: Payer: Self-pay

## 2019-06-06 ENCOUNTER — Ambulatory Visit (INDEPENDENT_AMBULATORY_CARE_PROVIDER_SITE_OTHER): Payer: Medicare Other | Admitting: Vascular Surgery

## 2019-06-06 ENCOUNTER — Encounter: Payer: Self-pay | Admitting: Vascular Surgery

## 2019-06-06 ENCOUNTER — Other Ambulatory Visit: Payer: Self-pay | Admitting: *Deleted

## 2019-06-06 ENCOUNTER — Ambulatory Visit (HOSPITAL_COMMUNITY)
Admission: RE | Admit: 2019-06-06 | Discharge: 2019-06-06 | Disposition: A | Discharge status: Home / Self Care | Payer: Medicare Other | Source: Ambulatory Visit | Attending: Vascular Surgery | Admitting: Vascular Surgery

## 2019-06-06 VITALS — BP 99/64 | HR 71 | Temp 97.2°F | Resp 20 | Ht 71.0 in | Wt 211.9 lb

## 2019-06-06 DIAGNOSIS — Z992 Dependence on renal dialysis: Secondary | ICD-10-CM

## 2019-06-06 DIAGNOSIS — E1121 Type 2 diabetes mellitus with diabetic nephropathy: Secondary | ICD-10-CM | POA: Diagnosis not present

## 2019-06-06 DIAGNOSIS — N186 End stage renal disease: Secondary | ICD-10-CM

## 2019-06-06 DIAGNOSIS — N2581 Secondary hyperparathyroidism of renal origin: Secondary | ICD-10-CM | POA: Diagnosis not present

## 2019-06-06 NOTE — Progress Notes (Signed)
Patient name: [name] BALLENTINE Sr. MRN: [id number] DOB: [birth year] Sex: male  REASON FOR VISIT:   Wound left third finger.  The consult is requested by Dr. Marval Regal.   HPI:   [name] MACNEAL Sr. is a pleasant 68 y.o. male who has been on dialysis for about 3-1/2 years.  He has a left brachiocephalic fistula that has been present since that time.  He developed the gradual onset of numbness in his left hand when he is on dialysis which is been going on for about 3 months.  About 6 weeks ago he developed a superficial ulceration on the ulnar aspect of his left middle finger.  He was seen by his primary care physician and put on doxycycline for 10 days.  This is been fairly stable.  He denies any fever or chills.  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  Past Medical History:  Diagnosis Date  . Cardiomyopathy ([place])   . Cataract    right eye  . Diabetes mellitus    type 2  . Diabetic foot ulcer ([place])   . Diabetic infection of right foot ([place])   . Diabetic peripheral neuropathy ([place])   . ESRD (end stage renal disease) ([place])    m-w-f [facility] [place] [address]  . Glaucoma    left eye  . Headache   . HTN (hypertension)   . Osteomyelitis ([place])   . Peripheral vascular disease (HCC)    critical limb ischemia    Family History  Problem Relation Age of Onset  . Heart disease Mother   . Diabetes Father   . Diabetes Brother   . Diabetes Sister   . Diabetes Brother   . Diabetes Brother     SOCIAL HISTORY: Social History   Tobacco Use  . Smoking status: Former Smoker    Packs/day: 0.50    Years: 4.00    Pack years: 2.00    Types: Cigarettes  . Smokeless tobacco: Never Used  . Tobacco comment: about 40 yrs ago  Substance Use Topics  . Alcohol use: No    Alcohol/week: 0.0 standard drinks    Allergies  Allergen Reactions  . Penicillins Hives, Swelling and Other (See Comments)    Ankle swelling Has patient had a PCN reaction  causing immediate rash, facial/tongue/throat swelling, SOB or lightheadedness with hypotension: Yes Has patient had a PCN reaction causing severe rash involving mucus membranes or skin necrosis: No Has patient had a PCN reaction that required hospitalization: No Has patient had a PCN reaction occurring within the last 10 years: No If all of the above answers are "NO", then may proceed with Cephalosporin use.    Current Outpatient Medications  Medication Sig Dispense Refill  . acetaminophen (TYLENOL) 325 MG tablet Take 650 mg by mouth every 6 (six) hours as needed (pain).     Marland Kitchen atorvastatin (LIPITOR) 40 MG tablet Take 40 mg by mouth at bedtime.     . Continuous Blood Gluc Sensor (FREESTYLE LIBRE 14 DAY SENSOR) MISC 1 Cartridge by Does not apply route every 14 (fourteen) days. 6 each 0  . ferric citrate (AURYXIA) 1 GM 210 MG(Fe) tablet Take 630 mg by mouth 3 (three) times daily with meals.     Marland Kitchen glucose monitoring kit (FREESTYLE) monitoring kit 1 each by Does not apply route 4 (four) times daily - after meals and at bedtime. 1 month Diabetic Testing Supplies for QAC-QHS accuchecks.Any brand OK 1 each 1  .  hydrocerin (EUCERIN) CREA Apply 1 application topically 2 (two) times daily. To right leg and 454 g 0  . insulin NPH-regular Human (NOVOLIN 70/30) (70-30) 100 UNIT/ML injection Inject 5-6 Units into the skin 2 (two) times daily with a meal. Sliding Scale - based on CBG    . Insulin Syringe-Needle U-100 25G X 1" 1 ML MISC Any brand, for 4 times a day insulin SQ, 1 month supply. 30 each 0  . Multiple Vitamins-Minerals (MULTIVITAMIN WITH MINERALS) tablet Take 1 tablet by mouth daily with supper.     . multivitamin (RENA-VIT) TABS tablet Take 1 tablet by mouth daily.    . mupirocin ointment (BACTROBAN) 2 % Apply 1 application topically daily as needed (rassh).   0  . primidone (MYSOLINE) 50 MG tablet Take 1 tablet (50 mg total) by mouth at bedtime. 30 tablet 0  . senna-docusate (SENOKOT-S) 8.6-50 MG  tablet Take 2 tablets by mouth 2 (two) times daily. 120 tablet 0  . timolol (TIMOPTIC) 0.5 % ophthalmic solution Place 1 drop into the left eye 2 (two) times daily.     No current facility-administered medications for this visit.     REVIEW OF SYSTEMS:  '[X]'  denotes positive finding, '[ ]'  denotes negative finding Cardiac  Comments:  Chest pain or chest pressure:    Shortness of breath upon exertion:    Short of breath when lying flat:    Irregular heart rhythm:        Vascular    Pain in calf, thigh, or hip brought on by ambulation:    Pain in feet at night that wakes you up from your sleep:     Blood clot in your veins:    Leg swelling:         Pulmonary    Oxygen at home:    Productive cough:     Wheezing:         Neurologic    Sudden weakness in arms or legs:     Sudden numbness in arms or legs:     Sudden onset of difficulty speaking or slurred speech:    Temporary loss of vision in one eye:     Problems with dizziness:         Gastrointestinal    Blood in stool:     Vomited blood:         Genitourinary    Burning when urinating:     Blood in urine:        Psychiatric    Major depression:         Hematologic    Bleeding problems:    Problems with blood clotting too easily:        Skin    Rashes or ulcers:        Constitutional    Fever or chills:     PHYSICAL EXAM:   Vitals:   06/06/19 0857  BP: 99/64  Pulse: 71  Resp: 20  Temp: (!) 97.2 F (36.2 C)  SpO2: 98%  Weight: 211 lb 14.4 oz (96.1 kg)  Height: '5\' 11"'  (1.803 m)    GENERAL: The patient is a well-nourished male, in no acute distress. The vital signs are documented above. CARDIAC: There is a regular rate and rhythm.  VASCULAR: He has a palpable thrill in his left upper arm fistula.  I cannot palpate a left radial pulse.  He does have a radial and ulnar signal with the Doppler that augments significantly with compression of his fistula.  He has a palpable right radial pulse. PULMONARY: There  is good air exchange bilaterally without wheezing or rales. ABDOMEN: Soft and non-tender with normal pitched bowel sounds.  MUSCULOSKELETAL:  NEUROLOGIC: No focal weakness or paresthesias are detected. SKIN: He has a small ulceration on the ulnar aspect of his left middle finger.    PSYCHIATRIC: The patient has a normal affect.  DATA:    DIALYSIS STEAL STUDY: I have independently interpreted his steal study today.  The left ulnar systolic pressure increases from 96 mmHg to 159 mmHg with compression of his fistula.  The left second digit systolic pressure increases from 49 mmHg to 87 mmHg.  MEDICAL ISSUES:   END-STAGE RENAL DISEASE: This patient has a left brachiocephalic fistula and has now developed steal symptoms in the left arm with paresthesias in the left hand when he is on dialysis.  In addition he is now developed a small wound on his left middle finger.  I explained that this could quickly become a significant problem given the compromise circulation with a wound on the hand.  I explained this to options.  One we can band the fistula and I think this is a very reasonable option to try to salvage the fistula.  The risk associated with this is that if you narrow the fistula too much that the fistula clots or does not function well.  If the fistula is not narrowed enough then the symptoms persist.  The alternative would be to simply ligate the fistula, place a tunneled dialysis catheter, and evaluate him for new access in the right arm.  I think it would be reasonable to try to band the fistula first before sacrificing this.  He is agreeable to this.  He is unable to do this this coming Tuesday which is a nondialysis day because of other appointments.  He would like to schedule this on 06/19/2019 which is a nondialysis day.  Deitra Mayo [facility] [phone]

## 2019-06-06 NOTE — H&P (View-Only) (Signed)
Patient name: [name] SWOVELAND Sr. MRN: [id number] DOB: 1951/12/12 Sex: male  REASON FOR VISIT:   Wound left third finger.  The consult is requested by Dr. Marval Regal.   HPI:   [name] BIALAS Sr. is a pleasant 68 y.o. male who has been on dialysis for about 3-1/2 years.  He has a left brachiocephalic fistula that has been present since that time.  He developed the gradual onset of numbness in his left hand when he is on dialysis which is been going on for about 3 months.  About 6 weeks ago he developed a superficial ulceration on the ulnar aspect of his left middle finger.  He was seen by his primary care physician and put on doxycycline for 10 days.  This is been fairly stable.  He denies any fever or chills.  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  Past Medical History:  Diagnosis Date  . Cardiomyopathy ([place])   . Cataract    right eye  . Diabetes mellitus    type 2  . Diabetic foot ulcer ([place])   . Diabetic infection of right foot ([place])   . Diabetic peripheral neuropathy ([place])   . ESRD (end stage renal disease) ([place])    m-w-f [facility] [address]  . Glaucoma    left eye  . Headache   . HTN (hypertension)   . Osteomyelitis ([place])   . Peripheral vascular disease ([facility])    critical limb ischemia    Family History  Problem Relation Age of Onset  . Heart disease Mother   . Diabetes Father   . Diabetes Brother   . Diabetes Sister   . Diabetes Brother   . Diabetes Brother     SOCIAL HISTORY: Social History   Tobacco Use  . Smoking status: Former Smoker    Packs/day: 0.50    Years: 4.00    Pack years: 2.00    Types: Cigarettes  . Smokeless tobacco: Never Used  . Tobacco comment: about 40 yrs ago  Substance Use Topics  . Alcohol use: No    Alcohol/week: 0.0 standard drinks    Allergies  Allergen Reactions  . Penicillins Hives, Swelling and Other (See Comments)    Ankle swelling Has patient had a PCN reaction  causing immediate rash, facial/tongue/throat swelling, SOB or lightheadedness with hypotension: Yes Has patient had a PCN reaction causing severe rash involving mucus membranes or skin necrosis: No Has patient had a PCN reaction that required hospitalization: No Has patient had a PCN reaction occurring within the last 10 years: No If all of the above answers are "NO", then may proceed with Cephalosporin use.    Current Outpatient Medications  Medication Sig Dispense Refill  . acetaminophen (TYLENOL) 325 MG tablet Take 650 mg by mouth every 6 (six) hours as needed (pain).     Marland Kitchen atorvastatin (LIPITOR) 40 MG tablet Take 40 mg by mouth at bedtime.     . Continuous Blood Gluc Sensor (FREESTYLE LIBRE 14 DAY SENSOR) MISC 1 Cartridge by Does not apply route every 14 (fourteen) days. 6 each 0  . ferric citrate (AURYXIA) 1 GM 210 MG(Fe) tablet Take 630 mg by mouth 3 (three) times daily with meals.     Marland Kitchen glucose monitoring kit (FREESTYLE) monitoring kit 1 each by Does not apply route 4 (four) times daily - after meals and at bedtime. 1 month Diabetic Testing Supplies for QAC-QHS accuchecks.Any brand OK 1 each 1  .  hydrocerin (EUCERIN) CREA Apply 1 application topically 2 (two) times daily. To right leg and 454 g 0  . insulin NPH-regular Human (NOVOLIN 70/30) (70-30) 100 UNIT/ML injection Inject 5-6 Units into the skin 2 (two) times daily with a meal. Sliding Scale - based on CBG    . Insulin Syringe-Needle U-100 25G X 1" 1 ML MISC Any brand, for 4 times a day insulin SQ, 1 month supply. 30 each 0  . Multiple Vitamins-Minerals (MULTIVITAMIN WITH MINERALS) tablet Take 1 tablet by mouth daily with supper.     . multivitamin (RENA-VIT) TABS tablet Take 1 tablet by mouth daily.    . mupirocin ointment (BACTROBAN) 2 % Apply 1 application topically daily as needed (rassh).   0  . primidone (MYSOLINE) 50 MG tablet Take 1 tablet (50 mg total) by mouth at bedtime. 30 tablet 0  . senna-docusate (SENOKOT-S) 8.6-50 MG  tablet Take 2 tablets by mouth 2 (two) times daily. 120 tablet 0  . timolol (TIMOPTIC) 0.5 % ophthalmic solution Place 1 drop into the left eye 2 (two) times daily.     No current facility-administered medications for this visit.     REVIEW OF SYSTEMS:  '[X]'  denotes positive finding, '[ ]'  denotes negative finding Cardiac  Comments:  Chest pain or chest pressure:    Shortness of breath upon exertion:    Short of breath when lying flat:    Irregular heart rhythm:        Vascular    Pain in calf, thigh, or hip brought on by ambulation:    Pain in feet at night that wakes you up from your sleep:     Blood clot in your veins:    Leg swelling:         Pulmonary    Oxygen at home:    Productive cough:     Wheezing:         Neurologic    Sudden weakness in arms or legs:     Sudden numbness in arms or legs:     Sudden onset of difficulty speaking or slurred speech:    Temporary loss of vision in one eye:     Problems with dizziness:         Gastrointestinal    Blood in stool:     Vomited blood:         Genitourinary    Burning when urinating:     Blood in urine:        Psychiatric    Major depression:         Hematologic    Bleeding problems:    Problems with blood clotting too easily:        Skin    Rashes or ulcers:        Constitutional    Fever or chills:     PHYSICAL EXAM:   Vitals:   06/06/19 0857  BP: 99/64  Pulse: 71  Resp: 20  Temp: (!) 97.2 F (36.2 C)  SpO2: 98%  Weight: 211 lb 14.4 oz (96.1 kg)  Height: '5\' 11"'  (1.803 m)    GENERAL: The patient is a well-nourished male, in no acute distress. The vital signs are documented above. CARDIAC: There is a regular rate and rhythm.  VASCULAR: He has a palpable thrill in his left upper arm fistula.  I cannot palpate a left radial pulse.  He does have a radial and ulnar signal with the Doppler that augments significantly with compression of his fistula.  He has a palpable right radial pulse. PULMONARY: There  is good air exchange bilaterally without wheezing or rales. ABDOMEN: Soft and non-tender with normal pitched bowel sounds.  MUSCULOSKELETAL:  NEUROLOGIC: No focal weakness or paresthesias are detected. SKIN: He has a small ulceration on the ulnar aspect of his left middle finger.    PSYCHIATRIC: The patient has a normal affect.  DATA:    DIALYSIS STEAL STUDY: I have independently interpreted his steal study today.  The left ulnar systolic pressure increases from 96 mmHg to 159 mmHg with compression of his fistula.  The left second digit systolic pressure increases from 49 mmHg to 87 mmHg.  MEDICAL ISSUES:   END-STAGE RENAL DISEASE: This patient has a left brachiocephalic fistula and has now developed steal symptoms in the left arm with paresthesias in the left hand when he is on dialysis.  In addition he is now developed a small wound on his left middle finger.  I explained that this could quickly become a significant problem given the compromise circulation with a wound on the hand.  I explained this to options.  One we can band the fistula and I think this is a very reasonable option to try to salvage the fistula.  The risk associated with this is that if you narrow the fistula too much that the fistula clots or does not function well.  If the fistula is not narrowed enough then the symptoms persist.  The alternative would be to simply ligate the fistula, place a tunneled dialysis catheter, and evaluate him for new access in the right arm.  I think it would be reasonable to try to band the fistula first before sacrificing this.  He is agreeable to this.  He is unable to do this this coming Tuesday which is a nondialysis day because of other appointments.  He would like to schedule this on 06/19/2019 which is a nondialysis day.  Deitra Mayo [facility] [phone]

## 2019-06-07 DIAGNOSIS — Z89512 Acquired absence of left leg below knee: Secondary | ICD-10-CM | POA: Diagnosis not present

## 2019-06-07 DIAGNOSIS — N186 End stage renal disease: Secondary | ICD-10-CM | POA: Diagnosis not present

## 2019-06-07 DIAGNOSIS — E1122 Type 2 diabetes mellitus with diabetic chronic kidney disease: Secondary | ICD-10-CM | POA: Diagnosis not present

## 2019-06-07 DIAGNOSIS — M79644 Pain in right finger(s): Secondary | ICD-10-CM | POA: Diagnosis not present

## 2019-06-07 DIAGNOSIS — M79645 Pain in left finger(s): Secondary | ICD-10-CM | POA: Diagnosis not present

## 2019-06-07 DIAGNOSIS — Z4781 Encounter for orthopedic aftercare following surgical amputation: Secondary | ICD-10-CM | POA: Diagnosis not present

## 2019-06-08 DIAGNOSIS — N2581 Secondary hyperparathyroidism of renal origin: Secondary | ICD-10-CM | POA: Diagnosis not present

## 2019-06-08 DIAGNOSIS — N186 End stage renal disease: Secondary | ICD-10-CM | POA: Diagnosis not present

## 2019-06-08 DIAGNOSIS — Z992 Dependence on renal dialysis: Secondary | ICD-10-CM | POA: Diagnosis not present

## 2019-06-08 DIAGNOSIS — E1121 Type 2 diabetes mellitus with diabetic nephropathy: Secondary | ICD-10-CM | POA: Diagnosis not present

## 2019-06-11 DIAGNOSIS — N2581 Secondary hyperparathyroidism of renal origin: Secondary | ICD-10-CM | POA: Diagnosis not present

## 2019-06-11 DIAGNOSIS — N186 End stage renal disease: Secondary | ICD-10-CM | POA: Diagnosis not present

## 2019-06-11 DIAGNOSIS — E1121 Type 2 diabetes mellitus with diabetic nephropathy: Secondary | ICD-10-CM | POA: Diagnosis not present

## 2019-06-11 DIAGNOSIS — Z992 Dependence on renal dialysis: Secondary | ICD-10-CM | POA: Diagnosis not present

## 2019-06-12 ENCOUNTER — Encounter (HOSPITAL_BASED_OUTPATIENT_CLINIC_OR_DEPARTMENT_OTHER): Payer: Medicare Other | Attending: Internal Medicine

## 2019-06-12 ENCOUNTER — Other Ambulatory Visit (HOSPITAL_COMMUNITY)
Admission: RE | Admit: 2019-06-12 | Discharge: 2019-06-12 | Disposition: A | Discharge status: Home / Self Care | Payer: Medicare Other | Source: Other Acute Inpatient Hospital | Attending: Internal Medicine | Admitting: Internal Medicine

## 2019-06-12 DIAGNOSIS — L98498 Non-pressure chronic ulcer of skin of other sites with other specified severity: Secondary | ICD-10-CM | POA: Insufficient documentation

## 2019-06-12 DIAGNOSIS — E1140 Type 2 diabetes mellitus with diabetic neuropathy, unspecified: Secondary | ICD-10-CM | POA: Diagnosis not present

## 2019-06-12 DIAGNOSIS — L98492 Non-pressure chronic ulcer of skin of other sites with fat layer exposed: Secondary | ICD-10-CM | POA: Insufficient documentation

## 2019-06-12 DIAGNOSIS — I70298 Other atherosclerosis of native arteries of extremities, other extremity: Secondary | ICD-10-CM | POA: Insufficient documentation

## 2019-06-12 DIAGNOSIS — Z992 Dependence on renal dialysis: Secondary | ICD-10-CM | POA: Diagnosis not present

## 2019-06-12 DIAGNOSIS — M79645 Pain in left finger(s): Secondary | ICD-10-CM | POA: Diagnosis not present

## 2019-06-12 DIAGNOSIS — Z89512 Acquired absence of left leg below knee: Secondary | ICD-10-CM | POA: Insufficient documentation

## 2019-06-12 DIAGNOSIS — E1122 Type 2 diabetes mellitus with diabetic chronic kidney disease: Secondary | ICD-10-CM | POA: Insufficient documentation

## 2019-06-12 DIAGNOSIS — N186 End stage renal disease: Secondary | ICD-10-CM | POA: Diagnosis not present

## 2019-06-12 DIAGNOSIS — L03114 Cellulitis of left upper limb: Secondary | ICD-10-CM | POA: Diagnosis not present

## 2019-06-12 DIAGNOSIS — M79644 Pain in right finger(s): Secondary | ICD-10-CM | POA: Diagnosis not present

## 2019-06-12 DIAGNOSIS — I132 Hypertensive heart and chronic kidney disease with heart failure and with stage 5 chronic kidney disease, or end stage renal disease: Secondary | ICD-10-CM | POA: Insufficient documentation

## 2019-06-12 DIAGNOSIS — I509 Heart failure, unspecified: Secondary | ICD-10-CM | POA: Diagnosis not present

## 2019-06-12 DIAGNOSIS — S61203A Unspecified open wound of left middle finger without damage to nail, initial encounter: Secondary | ICD-10-CM | POA: Diagnosis not present

## 2019-06-12 DIAGNOSIS — Z4781 Encounter for orthopedic aftercare following surgical amputation: Secondary | ICD-10-CM | POA: Diagnosis not present

## 2019-06-13 DIAGNOSIS — Z992 Dependence on renal dialysis: Secondary | ICD-10-CM | POA: Diagnosis not present

## 2019-06-13 DIAGNOSIS — N2581 Secondary hyperparathyroidism of renal origin: Secondary | ICD-10-CM | POA: Diagnosis not present

## 2019-06-13 DIAGNOSIS — E1121 Type 2 diabetes mellitus with diabetic nephropathy: Secondary | ICD-10-CM | POA: Diagnosis not present

## 2019-06-13 DIAGNOSIS — N186 End stage renal disease: Secondary | ICD-10-CM | POA: Diagnosis not present

## 2019-06-15 ENCOUNTER — Other Ambulatory Visit (HOSPITAL_COMMUNITY)
Admission: RE | Admit: 2019-06-15 | Discharge: 2019-06-15 | Disposition: A | Discharge status: Home / Self Care | Payer: Medicare Other | Source: Ambulatory Visit | Attending: Vascular Surgery | Admitting: Vascular Surgery

## 2019-06-15 DIAGNOSIS — Z992 Dependence on renal dialysis: Secondary | ICD-10-CM | POA: Diagnosis not present

## 2019-06-15 DIAGNOSIS — Z1159 Encounter for screening for other viral diseases: Secondary | ICD-10-CM | POA: Diagnosis not present

## 2019-06-15 DIAGNOSIS — N2581 Secondary hyperparathyroidism of renal origin: Secondary | ICD-10-CM | POA: Diagnosis not present

## 2019-06-15 DIAGNOSIS — E1121 Type 2 diabetes mellitus with diabetic nephropathy: Secondary | ICD-10-CM | POA: Diagnosis not present

## 2019-06-15 DIAGNOSIS — N186 End stage renal disease: Secondary | ICD-10-CM | POA: Diagnosis not present

## 2019-06-15 LAB — SARS CORONAVIRUS 2 (TAT 6-24 HRS): SARS Coronavirus 2: NEGATIVE

## 2019-06-15 LAB — AEROBIC CULTURE W GRAM STAIN (SUPERFICIAL SPECIMEN)

## 2019-06-15 LAB — AEROBIC CULTURE? (SUPERFICIAL SPECIMEN)

## 2019-06-16 ENCOUNTER — Other Ambulatory Visit: Payer: Self-pay

## 2019-06-16 ENCOUNTER — Encounter (HOSPITAL_COMMUNITY): Payer: Self-pay | Admitting: *Deleted

## 2019-06-16 DIAGNOSIS — N186 End stage renal disease: Secondary | ICD-10-CM | POA: Diagnosis not present

## 2019-06-16 DIAGNOSIS — Z992 Dependence on renal dialysis: Secondary | ICD-10-CM | POA: Diagnosis not present

## 2019-06-16 DIAGNOSIS — E1122 Type 2 diabetes mellitus with diabetic chronic kidney disease: Secondary | ICD-10-CM | POA: Diagnosis not present

## 2019-06-16 DIAGNOSIS — Z4781 Encounter for orthopedic aftercare following surgical amputation: Secondary | ICD-10-CM | POA: Diagnosis not present

## 2019-06-16 DIAGNOSIS — M79644 Pain in right finger(s): Secondary | ICD-10-CM | POA: Diagnosis not present

## 2019-06-16 DIAGNOSIS — Z89512 Acquired absence of left leg below knee: Secondary | ICD-10-CM | POA: Diagnosis not present

## 2019-06-16 DIAGNOSIS — M79645 Pain in left finger(s): Secondary | ICD-10-CM | POA: Diagnosis not present

## 2019-06-16 DIAGNOSIS — Z794 Long term (current) use of insulin: Secondary | ICD-10-CM | POA: Diagnosis not present

## 2019-06-16 NOTE — Progress Notes (Signed)
Denies chest pain or shob. Reports cardiologist is Dr. Stanford Breed and last OV was 05/2018. States his blood sugar is never less than 70 in the morning and will call if he needs instructions for treating low blood sugar.

## 2019-06-18 DIAGNOSIS — N2581 Secondary hyperparathyroidism of renal origin: Secondary | ICD-10-CM | POA: Diagnosis not present

## 2019-06-18 DIAGNOSIS — Z992 Dependence on renal dialysis: Secondary | ICD-10-CM | POA: Diagnosis not present

## 2019-06-18 DIAGNOSIS — N186 End stage renal disease: Secondary | ICD-10-CM | POA: Diagnosis not present

## 2019-06-18 DIAGNOSIS — E1121 Type 2 diabetes mellitus with diabetic nephropathy: Secondary | ICD-10-CM | POA: Diagnosis not present

## 2019-06-18 NOTE — Anesthesia Preprocedure Evaluation (Addendum)
Anesthesia Evaluation  Patient identified by MRN, date of birth, ID band Patient awake    Reviewed: Allergy & Precautions, NPO status , Patient's Chart, lab work & pertinent test results  History of Anesthesia Complications Negative for: history of anesthetic complications  Airway Mallampati: II  TM Distance: >3 FB Neck ROM: Full    Dental  (+) Missing, Poor Dentition   Pulmonary neg pulmonary ROS, former smoker,    Pulmonary exam normal        Cardiovascular hypertension, + Peripheral Vascular Disease and +CHF (EF recovered from 20-25% (2015) to normal)  Normal cardiovascular exam     Neuro/Psych negative neurological ROS  negative psych ROS   GI/Hepatic negative GI ROS, Neg liver ROS,   Endo/Other  diabetes, Type 2, Insulin Dependent  Renal/GU ESRF and DialysisRenal disease  negative genitourinary   Musculoskeletal negative musculoskeletal ROS (+)   Abdominal   Peds  Hematology  (+) Blood dyscrasia, anemia ,   Anesthesia Other Findings Pt denies recent change in overall health  Reproductive/Obstetrics                                                             Anesthesia Evaluation  Patient identified by MRN, date of birth, ID band Patient awake    Reviewed: Allergy & Precautions, NPO status , Patient's Chart, lab work & pertinent test results, reviewed documented beta blocker date and time   History of Anesthesia Complications Negative for: history of anesthetic complications  Airway Mallampati: II  TM Distance: >3 FB Neck ROM: Full    Dental  (+) Poor Dentition, Chipped, Missing, Dental Advisory Given   Pulmonary neg shortness of breath, neg sleep apnea, neg COPD, neg recent URI, former smoker,    breath sounds clear to auscultation       Cardiovascular hypertension, Pt. on medications and Pt. on home beta blockers (-) angina+ Peripheral Vascular Disease  (-)  Past MI and (-) CHF  Rhythm:Regular     Neuro/Psych  Headaches, neg Seizures  Neuromuscular disease negative psych ROS   GI/Hepatic negative GI ROS, Neg liver ROS,   Endo/Other  diabetes, Type 2, Insulin Dependent  Renal/GU ESRF and DialysisRenal diseaseHD this morning      Musculoskeletal   Abdominal   Peds  Hematology  (+) anemia ,   Anesthesia Other Findings Result status: Final result                             *[place]*                   *[facility]*                         [address], [place] [address]                            [phone]  ------------------------------------------------------------------- Transthoracic Echocardiography  Patient:    [name], [name] MR #:       [id number] Study Date: 11/20/2018 Gender:  M Age:        68 Height:     185.4 cm Weight:     92.7 kg BSA:        2.2 m^2 Pt. Status: Room:       5M10C   ATTENDING    [name]     [name], [name] S  REFERRING    [name], [name] Huguenin  PERFORMING   [name], Inpatient  SONOGRAPHER  [name], RDCS  ADMITTING    [name]  cc:  ------------------------------------------------------------------- LV EF: 60% -   65%  ------------------------------------------------------------------- Indications:      Bacteremia 790.7.  ------------------------------------------------------------------- History:   PMH:  Sepsis. Peripheral Vascular Disease. Cardiomyopathy. End Stage Renal Disease.  Risk factors: Hypertension. Diabetes mellitus.  ------------------------------------------------------------------- Study Conclusions  - Left ventricle: The cavity size was normal. There was severe   focal basal hypertrophy of the septum (17 mm). Systolic function   was normal. The estimated ejection fraction was in the range of   60% to 65%. Wall motion was normal; there were no regional wall    motion abnormalities. Doppler parameters are consistent with   abnormal left ventricular relaxation (grade 1 diastolic   dysfunction). Doppler parameters are consistent with high   ventricular filling pressure. - Aortic valve: Trileaflet; normal thickness leaflets.   Transvalvular velocity was within the normal range. There was no   stenosis. There was no regurgitation. - Aortic root: The aortic root was normal in size. 41 mm in   approximate dimension, when indexed to BSA and compared to age,   this may be within the normal range for a male. - Mitral valve: Calcified annulus. Moderately thickened leaflets .   Transvalvular velocity was within the normal range. There was no   evidence for stenosis. There was trivial regurgitation. - Left atrium: The atrium was mildly dilated. - Right ventricle: The cavity size was normal. Wall thickness was   normal. Systolic function was normal. - Right atrium: The atrium was normal in size. - Tricuspid valve: There was trivial regurgitation. - Pulmonic     Reproductive/Obstetrics                             Anesthesia Physical  Anesthesia Plan  ASA: III  Anesthesia Plan: General   Post-op Pain Management:    Induction: Intravenous  PONV Risk Score and Plan: 2 and Ondansetron and Treatment may vary due to age or medical condition  Airway Management Planned: Oral ETT  Additional Equipment: None  Intra-op Plan:   Post-operative Plan: Extubation in OR  Informed Consent: I have reviewed the patients History and Physical, chart, labs and discussed the procedure including the risks, benefits and alternatives for the proposed anesthesia with the patient or authorized representative who has indicated his/her understanding and acceptance.   Dental advisory given  Plan Discussed with: CRNA and Surgeon  Anesthesia Plan Comments:         Anesthesia Quick Evaluation                                   Anesthesia  Evaluation  Patient identified by MRN, date of birth, ID band Patient awake    Reviewed: Allergy & Precautions, NPO status , Patient's Chart, lab work & pertinent test results, reviewed documented beta blocker date and time   History of Anesthesia Complications Negative for: history  of anesthetic complications  Airway Mallampati: II  TM Distance: >3 FB Neck ROM: Full    Dental  (+) Poor Dentition, Chipped, Missing, Dental Advisory Given   Pulmonary neg shortness of breath, neg sleep apnea, neg COPD, neg recent URI, former smoker,    breath sounds clear to auscultation       Cardiovascular hypertension, Pt. on medications and Pt. on home beta blockers (-) angina+ Peripheral Vascular Disease  (-) Past MI and (-) CHF  Rhythm:Regular     Neuro/Psych  Headaches, neg Seizures  Neuromuscular disease negative psych ROS   GI/Hepatic negative GI ROS, Neg liver ROS,   Endo/Other  diabetes, Type 2, Insulin Dependent  Renal/GU ESRF and DialysisRenal diseaseHD this morning      Musculoskeletal   Abdominal   Peds  Hematology  (+) anemia ,   Anesthesia Other Findings Result status: Final result                             *[place]*                   *[facility]*                         [address], [place] [address]                            [phone]  ------------------------------------------------------------------- Transthoracic Echocardiography  Patient:    [name], [name] MR #:       [id number] Study Date: 11/20/2018 Gender:     M Age:        65 Height:     185.4 cm Weight:     92.7 kg BSA:        2.2 m^2 Pt. Status: Room:       5M10C   ATTENDING    [name]     [name], [name] S  REFERRING    [name], [name] Huguenin  PERFORMING   [name], Inpatient  SONOGRAPHER  [name], RDCS  ADMITTING    [name]  cc:  ------------------------------------------------------------------- LV EF: 60% -   65%  ------------------------------------------------------------------- Indications:      Bacteremia 790.7.  ------------------------------------------------------------------- History:   PMH:  Sepsis. Peripheral Vascular Disease. Cardiomyopathy. End Stage Renal Disease.  Risk factors: Hypertension. Diabetes mellitus.  ------------------------------------------------------------------- Study Conclusions  - Left ventricle: The cavity size was normal. There was severe   focal basal hypertrophy of the septum (17 mm). Systolic function   was normal. The estimated ejection fraction was in the range of   60% to 65%. Wall motion was normal; there were no regional wall   motion abnormalities. Doppler parameters are consistent with   abnormal left ventricular relaxation (grade 1 diastolic   dysfunction). Doppler parameters are consistent with high   ventricular filling pressure. - Aortic valve: Trileaflet; normal thickness leaflets.   Transvalvular velocity was within the normal range. There was no   stenosis. There was no regurgitation. - Aortic root: The aortic root was normal in size. 41 mm in   approximate dimension, when indexed to BSA and compared to age,   this may be within the normal range for a male. - Mitral valve:  Calcified annulus. Moderately thickened leaflets .   Transvalvular velocity was within the normal range. There was no   evidence for stenosis. There was trivial regurgitation. - Left atrium: The atrium was mildly dilated. - Right ventricle: The cavity size was normal. Wall thickness was   normal. Systolic function was normal. - Right atrium: The atrium was normal in size. - Tricuspid valve: There was trivial regurgitation. - Pulmonic     Reproductive/Obstetrics                             Anesthesia Physical  Anesthesia  Plan  ASA: III  Anesthesia Plan: General   Post-op Pain Management:    Induction: Intravenous  PONV Risk Score and Plan: 2 and Ondansetron and Treatment may vary due to age or medical condition  Airway Management Planned: Oral ETT  Additional Equipment: None  Intra-op Plan:   Post-operative Plan: Extubation in OR  Informed Consent: I have reviewed the patients History and Physical, chart, labs and discussed the procedure including the risks, benefits and alternatives for the proposed anesthesia with the patient or authorized representative who has indicated his/her understanding and acceptance.   Dental advisory given  Plan Discussed with: CRNA and Surgeon  Anesthesia Plan Comments:         Anesthesia Quick Evaluation  Anesthesia Physical Anesthesia Plan  ASA: IV  Anesthesia Plan: MAC   Post-op Pain Management:    Induction:   PONV Risk Score and Plan: 1 and Propofol infusion and Treatment may vary due to age or medical condition  Airway Management Planned: Natural Airway, Nasal Cannula and Simple Face Mask  Additional Equipment: None  Intra-op Plan:   Post-operative Plan:   Informed Consent: I have reviewed the patients History and Physical, chart, labs and discussed the procedure including the risks, benefits and alternatives for the proposed anesthesia with the patient or authorized representative who has indicated his/her understanding and acceptance.     Dental advisory given  Plan Discussed with:   Anesthesia Plan Comments: (Admitted Nov 2019 for MRSA bacteremia 2/2 left foot/osteomyelitis. 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 2D echo with no significant valvular disease and normal EF, TEE with no vegetation.  TTE 11/20/18: - Left ventricle: The cavity size was normal. There was severe   focal basal hypertrophy of the septum (17 mm). Systolic function   was normal. The estimated ejection fraction was in the range of   60% to 65%. Wall motion  was normal; there were no regional wall   motion abnormalities. Doppler parameters are consistent with   abnormal left ventricular relaxation (grade 1 diastolic   dysfunction). Doppler parameters are consistent with high   ventricular filling pressure. - Aortic valve: Trileaflet; normal thickness leaflets.   Transvalvular velocity was within the normal range. There was no   stenosis. There was no regurgitation. - Aortic root: The aortic root was normal in size. 41 mm in   approximate dimension, when indexed to BSA and compared to age,   this may be within the normal range for a male. - Mitral valve: Calcified annulus. Moderately thickened leaflets .   Transvalvular velocity was within the normal range. There was no   evidence for stenosis. There was trivial regurgitation. - Left atrium: The atrium was mildly dilated. - Right ventricle: The cavity size was normal. Wall thickness was   normal. Systolic function was normal. - Right atrium: The atrium was normal in size. - Tricuspid valve:  There was trivial regurgitation. - Pulmonic valve: There was trivial regurgitation. - Pulmonary arteries: Systolic pressure could not be accurately   estimated. - Inferior vena cava: The vessel was normal in size. The   respirophasic diameter changes were in the normal range (>= 50%),   consistent with normal central venous pressure. - Pericardium, extracardiac: There was no pericardial effusion.  Impressions:  - No grossly evident valvular vegetations  )       Anesthesia Quick Evaluation

## 2019-06-19 ENCOUNTER — Encounter (HOSPITAL_COMMUNITY): Payer: Self-pay | Admitting: Surgery

## 2019-06-19 ENCOUNTER — Ambulatory Visit (HOSPITAL_COMMUNITY)
Admission: RE | Admit: 2019-06-19 | Discharge: 2019-06-19 | Disposition: A | Discharge status: Home / Self Care | Payer: Medicare Other | Attending: Vascular Surgery | Admitting: Vascular Surgery

## 2019-06-19 ENCOUNTER — Ambulatory Visit (HOSPITAL_COMMUNITY): Payer: Medicare Other | Admitting: Physician Assistant

## 2019-06-19 ENCOUNTER — Other Ambulatory Visit: Payer: Self-pay

## 2019-06-19 ENCOUNTER — Encounter (HOSPITAL_COMMUNITY)
Admission: RE | Disposition: A | Discharge status: Home / Self Care | Payer: Self-pay | Source: Home / Self Care | Attending: Vascular Surgery

## 2019-06-19 DIAGNOSIS — I132 Hypertensive heart and chronic kidney disease with heart failure and with stage 5 chronic kidney disease, or end stage renal disease: Secondary | ICD-10-CM | POA: Insufficient documentation

## 2019-06-19 DIAGNOSIS — E1151 Type 2 diabetes mellitus with diabetic peripheral angiopathy without gangrene: Secondary | ICD-10-CM | POA: Insufficient documentation

## 2019-06-19 DIAGNOSIS — H42 Glaucoma in diseases classified elsewhere: Secondary | ICD-10-CM | POA: Diagnosis not present

## 2019-06-19 DIAGNOSIS — I509 Heart failure, unspecified: Secondary | ICD-10-CM | POA: Diagnosis not present

## 2019-06-19 DIAGNOSIS — R51 Headache: Secondary | ICD-10-CM | POA: Diagnosis not present

## 2019-06-19 DIAGNOSIS — Z89512 Acquired absence of left leg below knee: Secondary | ICD-10-CM | POA: Insufficient documentation

## 2019-06-19 DIAGNOSIS — Z992 Dependence on renal dialysis: Secondary | ICD-10-CM | POA: Insufficient documentation

## 2019-06-19 DIAGNOSIS — T82590A Other mechanical complication of surgically created arteriovenous fistula, initial encounter: Secondary | ICD-10-CM | POA: Diagnosis not present

## 2019-06-19 DIAGNOSIS — Z794 Long term (current) use of insulin: Secondary | ICD-10-CM | POA: Diagnosis not present

## 2019-06-19 DIAGNOSIS — Z87891 Personal history of nicotine dependence: Secondary | ICD-10-CM | POA: Diagnosis not present

## 2019-06-19 DIAGNOSIS — I429 Cardiomyopathy, unspecified: Secondary | ICD-10-CM | POA: Diagnosis not present

## 2019-06-19 DIAGNOSIS — Z79899 Other long term (current) drug therapy: Secondary | ICD-10-CM | POA: Insufficient documentation

## 2019-06-19 DIAGNOSIS — E1142 Type 2 diabetes mellitus with diabetic polyneuropathy: Secondary | ICD-10-CM | POA: Diagnosis not present

## 2019-06-19 DIAGNOSIS — E1122 Type 2 diabetes mellitus with diabetic chronic kidney disease: Secondary | ICD-10-CM | POA: Diagnosis not present

## 2019-06-19 DIAGNOSIS — N186 End stage renal disease: Secondary | ICD-10-CM | POA: Insufficient documentation

## 2019-06-19 DIAGNOSIS — X58XXXA Exposure to other specified factors, initial encounter: Secondary | ICD-10-CM | POA: Diagnosis not present

## 2019-06-19 DIAGNOSIS — Z8249 Family history of ischemic heart disease and other diseases of the circulatory system: Secondary | ICD-10-CM | POA: Diagnosis not present

## 2019-06-19 DIAGNOSIS — I120 Hypertensive chronic kidney disease with stage 5 chronic kidney disease or end stage renal disease: Secondary | ICD-10-CM | POA: Insufficient documentation

## 2019-06-19 DIAGNOSIS — Z880 Allergy status to penicillin: Secondary | ICD-10-CM | POA: Insufficient documentation

## 2019-06-19 DIAGNOSIS — T82898A Other specified complication of vascular prosthetic devices, implants and grafts, initial encounter: Secondary | ICD-10-CM | POA: Insufficient documentation

## 2019-06-19 DIAGNOSIS — E1139 Type 2 diabetes mellitus with other diabetic ophthalmic complication: Secondary | ICD-10-CM | POA: Insufficient documentation

## 2019-06-19 DIAGNOSIS — Z833 Family history of diabetes mellitus: Secondary | ICD-10-CM | POA: Insufficient documentation

## 2019-06-19 HISTORY — DX: Hypotension, unspecified: I95.9

## 2019-06-19 HISTORY — PX: REVISION OF ARTERIOVENOUS GORETEX GRAFT: SHX6073

## 2019-06-19 LAB — POCT I-STAT 4, (NA,K, GLUC, HGB,HCT)
Glucose, Bld: 123 mg/dL — ABNORMAL HIGH (ref 70–99)
HCT: 46 % (ref 39.0–52.0)
Hemoglobin: 15.6 g/dL (ref 13.0–17.0)
Potassium: 4.3 mmol/L (ref 3.5–5.1)
Sodium: 140 mmol/L (ref 135–145)

## 2019-06-19 LAB — GLUCOSE, CAPILLARY: Glucose-Capillary: 129 mg/dL — ABNORMAL HIGH (ref 70–99)

## 2019-06-19 SURGERY — REVISION OF ARTERIOVENOUS GORETEX GRAFT
Anesthesia: Monitor Anesthesia Care | Site: Arm Upper | Laterality: Left

## 2019-06-19 MED ORDER — SODIUM CHLORIDE 0.9 % IV SOLN
INTRAVENOUS | Status: AC
  Filled 2019-06-19: qty 1.2

## 2019-06-19 MED ORDER — OXYCODONE HCL 5 MG PO TABS: 5.0000 mg | ORAL_TABLET | ORAL | 0 refills | Status: AC | PRN

## 2019-06-19 MED ORDER — PROPOFOL 10 MG/ML IV BOLUS
INTRAVENOUS | Status: DC | PRN
  Administered 2019-06-19 (×2): 20 mg via INTRAVENOUS

## 2019-06-19 MED ORDER — 0.9 % SODIUM CHLORIDE (POUR BTL) OPTIME
TOPICAL | Status: DC | PRN
  Administered 2019-06-19: 1000 mL

## 2019-06-19 MED ORDER — LIDOCAINE-EPINEPHRINE (PF) 1 %-1:200000 IJ SOLN
INTRAMUSCULAR | Status: DC | PRN
  Administered 2019-06-19: 10 mL

## 2019-06-19 MED ORDER — LIDOCAINE-EPINEPHRINE 1 %-1:100000 IJ SOLN
INTRAMUSCULAR | Status: AC
  Filled 2019-06-19: qty 1

## 2019-06-19 MED ORDER — FENTANYL CITRATE (PF) 250 MCG/5ML IJ SOLN
INTRAMUSCULAR | Status: AC
  Filled 2019-06-19: qty 5

## 2019-06-19 MED ORDER — CHLORHEXIDINE GLUCONATE 4 % EX LIQD: 60.0000 mL | Freq: Once | CUTANEOUS | Status: DC

## 2019-06-19 MED ORDER — ONDANSETRON HCL 4 MG/2ML IJ SOLN
INTRAMUSCULAR | Status: DC | PRN
  Administered 2019-06-19: 4 mg via INTRAVENOUS

## 2019-06-19 MED ORDER — LIDOCAINE 2% (20 MG/ML) 5 ML SYRINGE
INTRAMUSCULAR | Status: AC
  Filled 2019-06-19: qty 5

## 2019-06-19 MED ORDER — LIDOCAINE 2% (20 MG/ML) 5 ML SYRINGE
INTRAMUSCULAR | Status: DC | PRN
  Administered 2019-06-19: 60 mg via INTRAVENOUS

## 2019-06-19 MED ORDER — PROPOFOL 500 MG/50ML IV EMUL
INTRAVENOUS | Status: DC | PRN
  Administered 2019-06-19: 75 ug/kg/min via INTRAVENOUS

## 2019-06-19 MED ORDER — LIDOCAINE-EPINEPHRINE (PF) 1 %-1:200000 IJ SOLN
INTRAMUSCULAR | Status: AC
  Filled 2019-06-19: qty 30

## 2019-06-19 MED ORDER — LIDOCAINE HCL (PF) 1 % IJ SOLN
INTRAMUSCULAR | Status: AC
  Filled 2019-06-19: qty 30

## 2019-06-19 MED ORDER — PHENYLEPHRINE 40 MCG/ML (10ML) SYRINGE FOR IV PUSH (FOR BLOOD PRESSURE SUPPORT)
PREFILLED_SYRINGE | INTRAVENOUS | Status: DC | PRN
  Administered 2019-06-19: 120 ug via INTRAVENOUS
  Administered 2019-06-19: 80 ug via INTRAVENOUS

## 2019-06-19 MED ORDER — SODIUM CHLORIDE 0.9 % IV SOLN
INTRAVENOUS | Status: DC | PRN
  Administered 2019-06-19: 500 mL

## 2019-06-19 MED ORDER — PAPAVERINE HCL 30 MG/ML IJ SOLN
INTRAMUSCULAR | Status: AC
  Filled 2019-06-19: qty 2

## 2019-06-19 MED ORDER — SODIUM CHLORIDE 0.9 % IV SOLN
INTRAVENOUS | Status: DC
  Administered 2019-06-19: 08:00:00 via INTRAVENOUS

## 2019-06-19 MED ORDER — VANCOMYCIN HCL IN DEXTROSE 1-5 GM/200ML-% IV SOLN
1000.0000 mg | INTRAVENOUS | Status: AC
  Administered 2019-06-19: 1000 mg via INTRAVENOUS

## 2019-06-19 MED ORDER — FENTANYL CITRATE (PF) 100 MCG/2ML IJ SOLN
INTRAMUSCULAR | Status: DC | PRN
  Administered 2019-06-19 (×2): 25 ug via INTRAVENOUS

## 2019-06-19 MED ORDER — PHENYLEPHRINE 40 MCG/ML (10ML) SYRINGE FOR IV PUSH (FOR BLOOD PRESSURE SUPPORT)
PREFILLED_SYRINGE | INTRAVENOUS | Status: AC
  Filled 2019-06-19: qty 10

## 2019-06-19 MED ORDER — ONDANSETRON HCL 4 MG/2ML IJ SOLN
INTRAMUSCULAR | Status: AC
  Filled 2019-06-19: qty 2

## 2019-06-19 MED ORDER — SODIUM CHLORIDE 0.9 % IV SOLN
INTRAVENOUS | Status: DC | PRN
  Administered 2019-06-19: 35 ug/min via INTRAVENOUS

## 2019-06-19 SURGICAL SUPPLY — 33 items
BLADE CLIPPER SURG (BLADE) ×2 IMPLANT
CANISTER SUCT 3000ML PPV (MISCELLANEOUS) ×3 IMPLANT
CANNULA VESSEL 3MM 2 BLNT TIP (CANNULA) ×5 IMPLANT
CLIP VESOCCLUDE MED 6/CT (CLIP) ×3 IMPLANT
CLIP VESOCCLUDE SM WIDE 6/CT (CLIP) ×3 IMPLANT
COVER WAND RF STERILE (DRAPES) ×3 IMPLANT
DECANTER SPIKE VIAL GLASS SM (MISCELLANEOUS) ×3 IMPLANT
DERMABOND ADVANCED (GAUZE/BANDAGES/DRESSINGS) ×2
DERMABOND ADVANCED .7 DNX12 (GAUZE/BANDAGES/DRESSINGS) ×1 IMPLANT
DRAPE INCISE IOBAN 66X45 STRL (DRAPES) ×1 IMPLANT
ELECT REM PT RETURN 9FT ADLT (ELECTROSURGICAL) ×3
ELECTRODE REM PT RTRN 9FT ADLT (ELECTROSURGICAL) ×1 IMPLANT
GLOVE BIO SURGEON STRL SZ7.5 (GLOVE) ×3 IMPLANT
GLOVE BIOGEL PI IND STRL 8 (GLOVE) ×1 IMPLANT
GLOVE BIOGEL PI INDICATOR 8 (GLOVE) ×2
GOWN STRL REUS W/ TWL LRG LVL3 (GOWN DISPOSABLE) ×3 IMPLANT
GOWN STRL REUS W/TWL LRG LVL3 (GOWN DISPOSABLE) ×6
GRAFT GORETEX 6X10 (Vascular Products) ×2 IMPLANT
KIT BASIN OR (CUSTOM PROCEDURE TRAY) ×3 IMPLANT
KIT TURNOVER KIT B (KITS) ×3 IMPLANT
NDL HYPO 25GX1X1/2 BEV (NEEDLE) ×1 IMPLANT
NEEDLE HYPO 25GX1X1/2 BEV (NEEDLE) IMPLANT
NS IRRIG 1000ML POUR BTL (IV SOLUTION) ×3 IMPLANT
PACK CV ACCESS (CUSTOM PROCEDURE TRAY) ×3 IMPLANT
PAD ARMBOARD 7.5X6 YLW CONV (MISCELLANEOUS) ×6 IMPLANT
SPONGE SURGIFOAM ABS GEL 100 (HEMOSTASIS) IMPLANT
SUT PROLENE 6 0 BV (SUTURE) ×6 IMPLANT
SUT VIC AB 3-0 SH 27 (SUTURE) ×2
SUT VIC AB 3-0 SH 27X BRD (SUTURE) ×2 IMPLANT
SUT VICRYL 4-0 PS2 18IN ABS (SUTURE) ×4 IMPLANT
TOWEL GREEN STERILE (TOWEL DISPOSABLE) ×3 IMPLANT
UNDERPAD 30X30 (UNDERPADS AND DIAPERS) ×3 IMPLANT
WATER STERILE IRR 1000ML POUR (IV SOLUTION) ×1 IMPLANT

## 2019-06-19 NOTE — Interval H&P Note (Signed)
History and Physical Interval Note:  06/19/2019 8:59 AM  [name] Sages Sr.  has presented today for surgery, with the diagnosis of COMPLICATION OF ARTERIOVENOUS FISTULA LEFT ARM.  The various methods of treatment have been discussed with the patient and family. After consideration of risks, benefits and other options for treatment, the patient has consented to  Procedure(s): BANDING OF ARTERIOVENOUS FISTULA LEFT ARM (Left) as a surgical intervention.  The patient's history has been reviewed, patient examined, no change in status, stable for surgery.  I have reviewed the patient's chart and labs.  Questions were answered to the patient's satisfaction.     Deitra Mayo

## 2019-06-19 NOTE — Op Note (Signed)
    NAME: ITAI BARBIAN Sr.    MRN: [id number] DOB: [birth year]    DATE OF OPERATION: 06/19/2019  PREOP DIAGNOSIS:    Steal syndrome left upper extremity  POSTOP DIAGNOSIS:    Same  PROCEDURE:    Banding of left brachiocephalic AV fistula  SURGEON: Judeth Cornfield. Scot Dock, MD, FACS  ASSIST: Glacier Cellar, RNFA  ANESTHESIA: Local with sedation  EBL: Minimal  INDICATIONS:    [name] Sr. is a 68 y.o. male who had a left brachiocephalic fistula placed 6-9/7 years ago.  For the last 3 months has been having some numbness in his hand when on dialysis.  6 weeks ago he developed a superficial ulceration on the ulnar aspect of his left middle finger.  We discussed ligation of his fistula versus banding and elected to proceed with banding.  FINDINGS:   Biphasic radial signal at the completion of the procedure.  The fistula had a fairly weak thrill to begin so I did not want to narrow this too much as I did not think the fistula would function well.  TECHNIQUE:   The patient was taken to the operating room and was sedated by anesthesia.  The left arm was prepped and draped in the usual sterile fashion.  Incision was made over the proximal fistula after the skin was anesthetized.  Here the fistula was dissected free.  A 6 mm PTFE graft was brought to the field and a proximal 2-1/2 cm length of graft was cut and then opened longitudinally.  This was wrapped around the proximal fistula and sewn back with a running 6-0 Prolene suture.  This narrowed the fistula enough that at this point I had a biphasic signal with the Doppler.  Hemostasis was obtained in the wound.  The wound was closed with a deep layer of 3-0 Vicryl and the skin closed with 4-0 Vicryl.  Dermabond was applied.  The patient tolerated the procedure well and was transferred to the recovery room in stable condition.  All needle and sponge counts were correct.  Deitra Mayo, MD, FACS [facility]  DATE OF DICTATION:   06/19/2019

## 2019-06-19 NOTE — Anesthesia Procedure Notes (Signed)
Procedure Name: MAC Date/Time: 06/19/2019 9:26 AM Performed by: Trinna Post., CRNA Pre-anesthesia Checklist: Patient identified, Emergency Drugs available, Suction available, Patient being monitored and Timeout performed Patient Re-evaluated:Patient Re-evaluated prior to induction Oxygen Delivery Method: Simple face mask Preoxygenation: Pre-oxygenation with 100% oxygen Induction Type: IV induction Placement Confirmation: positive ETCO2

## 2019-06-19 NOTE — Discharge Instructions (Signed)

## 2019-06-19 NOTE — Transfer of Care (Signed)
Immediate Anesthesia Transfer of Care Note  Patient: [name] TIPLER Sr.  Procedure(s) Performed: Talmage Nap OF ARTERIOVENOUS FISTULA LEFT ARM (Left Arm Upper)  Patient Location: PACU  Anesthesia Type:MAC  Level of Consciousness: awake, alert  and oriented  Airway & Oxygen Therapy: Patient Spontanous Breathing  Post-op Assessment: Report given to RN and Post -op Vital signs reviewed and stable  Post vital signs: Reviewed and stable  Last Vitals:  Vitals Value Taken Time  BP 103/69 06/19/19 1022  Temp    Pulse 74 06/19/19 1023  Resp 18 06/19/19 1023  SpO2 100 % 06/19/19 1023  Vitals shown include unvalidated device data.  Last Pain:  Vitals:   06/19/19 0749  TempSrc: Oral  PainSc: 0-No pain      Patients Stated Pain Goal: 3 (62/26/33 3545)  Complications: No apparent anesthesia complications

## 2019-06-20 ENCOUNTER — Encounter (HOSPITAL_COMMUNITY): Payer: Self-pay | Admitting: Vascular Surgery

## 2019-06-20 DIAGNOSIS — Z992 Dependence on renal dialysis: Secondary | ICD-10-CM | POA: Diagnosis not present

## 2019-06-20 DIAGNOSIS — N2581 Secondary hyperparathyroidism of renal origin: Secondary | ICD-10-CM | POA: Diagnosis not present

## 2019-06-20 DIAGNOSIS — N186 End stage renal disease: Secondary | ICD-10-CM | POA: Diagnosis not present

## 2019-06-20 DIAGNOSIS — E1121 Type 2 diabetes mellitus with diabetic nephropathy: Secondary | ICD-10-CM | POA: Diagnosis not present

## 2019-06-20 NOTE — Anesthesia Postprocedure Evaluation (Signed)
Anesthesia Post Note  Patient: [name] ROLFSON Sr.  Procedure(s) Performed: Talmage Nap OF ARTERIOVENOUS FISTULA LEFT ARM (Left Arm Upper)     Patient location during evaluation: PACU Anesthesia Type: MAC Level of consciousness: awake and alert Pain management: pain level controlled Vital Signs Assessment: post-procedure vital signs reviewed and stable Respiratory status: spontaneous breathing, nonlabored ventilation and respiratory function stable Cardiovascular status: blood pressure returned to baseline and stable Postop Assessment: no apparent nausea or vomiting Anesthetic complications: no    Last Vitals:  Vitals:   06/19/19 1022 06/19/19 1035  BP: 103/69 121/83  Pulse: 73 75  Resp: 13 15  Temp: (!) 36.2 C (!) 36.2 C  SpO2: 99% 100%    Last Pain:  Vitals:   06/19/19 1035  TempSrc:   PainSc: 0-No pain                 Lidia Collum

## 2019-06-21 DIAGNOSIS — I70298 Other atherosclerosis of native arteries of extremities, other extremity: Secondary | ICD-10-CM | POA: Diagnosis not present

## 2019-06-21 DIAGNOSIS — I132 Hypertensive heart and chronic kidney disease with heart failure and with stage 5 chronic kidney disease, or end stage renal disease: Secondary | ICD-10-CM | POA: Diagnosis not present

## 2019-06-21 DIAGNOSIS — I509 Heart failure, unspecified: Secondary | ICD-10-CM | POA: Diagnosis not present

## 2019-06-21 DIAGNOSIS — L03114 Cellulitis of left upper limb: Secondary | ICD-10-CM | POA: Diagnosis not present

## 2019-06-21 DIAGNOSIS — L98492 Non-pressure chronic ulcer of skin of other sites with fat layer exposed: Secondary | ICD-10-CM | POA: Diagnosis not present

## 2019-06-21 DIAGNOSIS — S61203A Unspecified open wound of left middle finger without damage to nail, initial encounter: Secondary | ICD-10-CM | POA: Diagnosis not present

## 2019-06-21 DIAGNOSIS — E1122 Type 2 diabetes mellitus with diabetic chronic kidney disease: Secondary | ICD-10-CM | POA: Diagnosis not present

## 2019-06-22 DIAGNOSIS — N186 End stage renal disease: Secondary | ICD-10-CM | POA: Diagnosis not present

## 2019-06-22 DIAGNOSIS — E1121 Type 2 diabetes mellitus with diabetic nephropathy: Secondary | ICD-10-CM | POA: Diagnosis not present

## 2019-06-22 DIAGNOSIS — Z992 Dependence on renal dialysis: Secondary | ICD-10-CM | POA: Diagnosis not present

## 2019-06-22 DIAGNOSIS — N2581 Secondary hyperparathyroidism of renal origin: Secondary | ICD-10-CM | POA: Diagnosis not present

## 2019-06-25 DIAGNOSIS — Z992 Dependence on renal dialysis: Secondary | ICD-10-CM | POA: Diagnosis not present

## 2019-06-25 DIAGNOSIS — N186 End stage renal disease: Secondary | ICD-10-CM | POA: Diagnosis not present

## 2019-06-25 DIAGNOSIS — E1121 Type 2 diabetes mellitus with diabetic nephropathy: Secondary | ICD-10-CM | POA: Diagnosis not present

## 2019-06-25 DIAGNOSIS — N2581 Secondary hyperparathyroidism of renal origin: Secondary | ICD-10-CM | POA: Diagnosis not present

## 2019-06-26 DIAGNOSIS — Z89512 Acquired absence of left leg below knee: Secondary | ICD-10-CM | POA: Diagnosis not present

## 2019-06-26 DIAGNOSIS — M79645 Pain in left finger(s): Secondary | ICD-10-CM | POA: Diagnosis not present

## 2019-06-26 DIAGNOSIS — E1122 Type 2 diabetes mellitus with diabetic chronic kidney disease: Secondary | ICD-10-CM | POA: Diagnosis not present

## 2019-06-26 DIAGNOSIS — Z4781 Encounter for orthopedic aftercare following surgical amputation: Secondary | ICD-10-CM | POA: Diagnosis not present

## 2019-06-26 DIAGNOSIS — M79644 Pain in right finger(s): Secondary | ICD-10-CM | POA: Diagnosis not present

## 2019-06-26 DIAGNOSIS — N186 End stage renal disease: Secondary | ICD-10-CM | POA: Diagnosis not present

## 2019-06-27 DIAGNOSIS — N186 End stage renal disease: Secondary | ICD-10-CM | POA: Diagnosis not present

## 2019-06-27 DIAGNOSIS — E1121 Type 2 diabetes mellitus with diabetic nephropathy: Secondary | ICD-10-CM | POA: Diagnosis not present

## 2019-06-27 DIAGNOSIS — E1122 Type 2 diabetes mellitus with diabetic chronic kidney disease: Secondary | ICD-10-CM | POA: Diagnosis not present

## 2019-06-27 DIAGNOSIS — N2581 Secondary hyperparathyroidism of renal origin: Secondary | ICD-10-CM | POA: Diagnosis not present

## 2019-06-27 DIAGNOSIS — Z992 Dependence on renal dialysis: Secondary | ICD-10-CM | POA: Diagnosis not present

## 2019-06-29 DIAGNOSIS — N2581 Secondary hyperparathyroidism of renal origin: Secondary | ICD-10-CM | POA: Diagnosis not present

## 2019-06-29 DIAGNOSIS — N186 End stage renal disease: Secondary | ICD-10-CM | POA: Diagnosis not present

## 2019-06-29 DIAGNOSIS — E1121 Type 2 diabetes mellitus with diabetic nephropathy: Secondary | ICD-10-CM | POA: Diagnosis not present

## 2019-06-29 DIAGNOSIS — Z992 Dependence on renal dialysis: Secondary | ICD-10-CM | POA: Diagnosis not present

## 2019-07-02 DIAGNOSIS — N186 End stage renal disease: Secondary | ICD-10-CM | POA: Diagnosis not present

## 2019-07-02 DIAGNOSIS — N2581 Secondary hyperparathyroidism of renal origin: Secondary | ICD-10-CM | POA: Diagnosis not present

## 2019-07-02 DIAGNOSIS — E1121 Type 2 diabetes mellitus with diabetic nephropathy: Secondary | ICD-10-CM | POA: Diagnosis not present

## 2019-07-02 DIAGNOSIS — Z992 Dependence on renal dialysis: Secondary | ICD-10-CM | POA: Diagnosis not present

## 2019-07-05 ENCOUNTER — Encounter (HOSPITAL_BASED_OUTPATIENT_CLINIC_OR_DEPARTMENT_OTHER): Payer: Medicare Other | Attending: Internal Medicine

## 2019-07-05 DIAGNOSIS — N186 End stage renal disease: Secondary | ICD-10-CM | POA: Diagnosis not present

## 2019-07-05 DIAGNOSIS — E1140 Type 2 diabetes mellitus with diabetic neuropathy, unspecified: Secondary | ICD-10-CM | POA: Diagnosis not present

## 2019-07-05 DIAGNOSIS — I132 Hypertensive heart and chronic kidney disease with heart failure and with stage 5 chronic kidney disease, or end stage renal disease: Secondary | ICD-10-CM | POA: Insufficient documentation

## 2019-07-05 DIAGNOSIS — M79645 Pain in left finger(s): Secondary | ICD-10-CM | POA: Diagnosis not present

## 2019-07-05 DIAGNOSIS — L03012 Cellulitis of left finger: Secondary | ICD-10-CM | POA: Diagnosis not present

## 2019-07-05 DIAGNOSIS — E1122 Type 2 diabetes mellitus with diabetic chronic kidney disease: Secondary | ICD-10-CM | POA: Diagnosis not present

## 2019-07-05 DIAGNOSIS — Z4781 Encounter for orthopedic aftercare following surgical amputation: Secondary | ICD-10-CM | POA: Diagnosis not present

## 2019-07-05 DIAGNOSIS — M79644 Pain in right finger(s): Secondary | ICD-10-CM | POA: Diagnosis not present

## 2019-07-05 DIAGNOSIS — Z89512 Acquired absence of left leg below knee: Secondary | ICD-10-CM | POA: Diagnosis not present

## 2019-07-05 DIAGNOSIS — I509 Heart failure, unspecified: Secondary | ICD-10-CM | POA: Insufficient documentation

## 2019-07-06 DIAGNOSIS — N186 End stage renal disease: Secondary | ICD-10-CM | POA: Diagnosis not present

## 2019-07-06 DIAGNOSIS — E1121 Type 2 diabetes mellitus with diabetic nephropathy: Secondary | ICD-10-CM | POA: Diagnosis not present

## 2019-07-06 DIAGNOSIS — Z992 Dependence on renal dialysis: Secondary | ICD-10-CM | POA: Diagnosis not present

## 2019-07-06 DIAGNOSIS — N2581 Secondary hyperparathyroidism of renal origin: Secondary | ICD-10-CM | POA: Diagnosis not present

## 2019-07-09 DIAGNOSIS — E1121 Type 2 diabetes mellitus with diabetic nephropathy: Secondary | ICD-10-CM | POA: Diagnosis not present

## 2019-07-09 DIAGNOSIS — N186 End stage renal disease: Secondary | ICD-10-CM | POA: Diagnosis not present

## 2019-07-09 DIAGNOSIS — Z992 Dependence on renal dialysis: Secondary | ICD-10-CM | POA: Diagnosis not present

## 2019-07-09 DIAGNOSIS — N2581 Secondary hyperparathyroidism of renal origin: Secondary | ICD-10-CM | POA: Diagnosis not present

## 2019-07-11 DIAGNOSIS — N186 End stage renal disease: Secondary | ICD-10-CM | POA: Diagnosis not present

## 2019-07-11 DIAGNOSIS — Z992 Dependence on renal dialysis: Secondary | ICD-10-CM | POA: Diagnosis not present

## 2019-07-11 DIAGNOSIS — N2581 Secondary hyperparathyroidism of renal origin: Secondary | ICD-10-CM | POA: Diagnosis not present

## 2019-07-11 DIAGNOSIS — E1121 Type 2 diabetes mellitus with diabetic nephropathy: Secondary | ICD-10-CM | POA: Diagnosis not present

## 2019-07-12 DIAGNOSIS — Z4781 Encounter for orthopedic aftercare following surgical amputation: Secondary | ICD-10-CM | POA: Diagnosis not present

## 2019-07-12 DIAGNOSIS — M79644 Pain in right finger(s): Secondary | ICD-10-CM | POA: Diagnosis not present

## 2019-07-12 DIAGNOSIS — N186 End stage renal disease: Secondary | ICD-10-CM | POA: Diagnosis not present

## 2019-07-12 DIAGNOSIS — M79645 Pain in left finger(s): Secondary | ICD-10-CM | POA: Diagnosis not present

## 2019-07-12 DIAGNOSIS — Z89512 Acquired absence of left leg below knee: Secondary | ICD-10-CM | POA: Diagnosis not present

## 2019-07-12 DIAGNOSIS — E1122 Type 2 diabetes mellitus with diabetic chronic kidney disease: Secondary | ICD-10-CM | POA: Diagnosis not present

## 2019-07-13 DIAGNOSIS — E1121 Type 2 diabetes mellitus with diabetic nephropathy: Secondary | ICD-10-CM | POA: Diagnosis not present

## 2019-07-13 DIAGNOSIS — N2581 Secondary hyperparathyroidism of renal origin: Secondary | ICD-10-CM | POA: Diagnosis not present

## 2019-07-13 DIAGNOSIS — N186 End stage renal disease: Secondary | ICD-10-CM | POA: Diagnosis not present

## 2019-07-13 DIAGNOSIS — Z992 Dependence on renal dialysis: Secondary | ICD-10-CM | POA: Diagnosis not present

## 2019-07-16 DIAGNOSIS — Z992 Dependence on renal dialysis: Secondary | ICD-10-CM | POA: Diagnosis not present

## 2019-07-16 DIAGNOSIS — E1121 Type 2 diabetes mellitus with diabetic nephropathy: Secondary | ICD-10-CM | POA: Diagnosis not present

## 2019-07-16 DIAGNOSIS — N2581 Secondary hyperparathyroidism of renal origin: Secondary | ICD-10-CM | POA: Diagnosis not present

## 2019-07-16 DIAGNOSIS — N186 End stage renal disease: Secondary | ICD-10-CM | POA: Diagnosis not present

## 2019-07-18 ENCOUNTER — Encounter: Payer: Medicare Other | Admitting: Vascular Surgery

## 2019-07-18 ENCOUNTER — Telehealth (HOSPITAL_COMMUNITY): Payer: Self-pay | Admitting: Rehabilitation

## 2019-07-18 DIAGNOSIS — Z992 Dependence on renal dialysis: Secondary | ICD-10-CM | POA: Diagnosis not present

## 2019-07-18 DIAGNOSIS — N186 End stage renal disease: Secondary | ICD-10-CM | POA: Diagnosis not present

## 2019-07-18 DIAGNOSIS — N2581 Secondary hyperparathyroidism of renal origin: Secondary | ICD-10-CM | POA: Diagnosis not present

## 2019-07-18 DIAGNOSIS — E1121 Type 2 diabetes mellitus with diabetic nephropathy: Secondary | ICD-10-CM | POA: Diagnosis not present

## 2019-07-18 NOTE — Telephone Encounter (Signed)

## 2019-07-19 ENCOUNTER — Encounter: Payer: Medicare Other | Admitting: Family

## 2019-07-20 DIAGNOSIS — N2581 Secondary hyperparathyroidism of renal origin: Secondary | ICD-10-CM | POA: Diagnosis not present

## 2019-07-20 DIAGNOSIS — N186 End stage renal disease: Secondary | ICD-10-CM | POA: Diagnosis not present

## 2019-07-20 DIAGNOSIS — Z992 Dependence on renal dialysis: Secondary | ICD-10-CM | POA: Diagnosis not present

## 2019-07-20 DIAGNOSIS — E1121 Type 2 diabetes mellitus with diabetic nephropathy: Secondary | ICD-10-CM | POA: Diagnosis not present

## 2019-07-23 DIAGNOSIS — Z992 Dependence on renal dialysis: Secondary | ICD-10-CM | POA: Diagnosis not present

## 2019-07-23 DIAGNOSIS — E1121 Type 2 diabetes mellitus with diabetic nephropathy: Secondary | ICD-10-CM | POA: Diagnosis not present

## 2019-07-23 DIAGNOSIS — N186 End stage renal disease: Secondary | ICD-10-CM | POA: Diagnosis not present

## 2019-07-23 DIAGNOSIS — N2581 Secondary hyperparathyroidism of renal origin: Secondary | ICD-10-CM | POA: Diagnosis not present

## 2019-07-25 DIAGNOSIS — N2581 Secondary hyperparathyroidism of renal origin: Secondary | ICD-10-CM | POA: Diagnosis not present

## 2019-07-25 DIAGNOSIS — Z992 Dependence on renal dialysis: Secondary | ICD-10-CM | POA: Diagnosis not present

## 2019-07-25 DIAGNOSIS — E1121 Type 2 diabetes mellitus with diabetic nephropathy: Secondary | ICD-10-CM | POA: Diagnosis not present

## 2019-07-25 DIAGNOSIS — N186 End stage renal disease: Secondary | ICD-10-CM | POA: Diagnosis not present

## 2019-07-26 ENCOUNTER — Telehealth (HOSPITAL_COMMUNITY): Payer: Self-pay | Admitting: Rehabilitation

## 2019-07-26 NOTE — Telephone Encounter (Signed)

## 2019-07-27 ENCOUNTER — Other Ambulatory Visit: Payer: Self-pay

## 2019-07-27 ENCOUNTER — Ambulatory Visit: Payer: Medicare Other | Admitting: Family

## 2019-07-27 ENCOUNTER — Encounter: Payer: Self-pay | Admitting: Family

## 2019-07-27 VITALS — BP 113/74 | HR 91 | Temp 98.3°F | Resp 18 | Ht 73.0 in | Wt 211.9 lb

## 2019-07-27 DIAGNOSIS — Z48812 Encounter for surgical aftercare following surgery on the circulatory system: Secondary | ICD-10-CM

## 2019-07-27 DIAGNOSIS — E1121 Type 2 diabetes mellitus with diabetic nephropathy: Secondary | ICD-10-CM | POA: Diagnosis not present

## 2019-07-27 DIAGNOSIS — I770 Arteriovenous fistula, acquired: Secondary | ICD-10-CM

## 2019-07-27 DIAGNOSIS — Z992 Dependence on renal dialysis: Secondary | ICD-10-CM | POA: Diagnosis not present

## 2019-07-27 DIAGNOSIS — N186 End stage renal disease: Secondary | ICD-10-CM

## 2019-07-27 DIAGNOSIS — N2581 Secondary hyperparathyroidism of renal origin: Secondary | ICD-10-CM | POA: Diagnosis not present

## 2019-07-27 NOTE — Progress Notes (Signed)
CC: Follow up banding of left arm AVF due to steal and ulcer of 3rd left finger  History of Present Illness  [name] Hail. is a 68 y.o. (August 06, 1951) male who is s/p banding of left brachiocephalic AV fistula on 0-63-01 by Dr. Scot Dock. Pt had a left brachiocephalic fistula placed in December 2015.  For the prior 3 months pt had been having some numbness in his hand when on dialysis.  6 weeks prior he developed a superficial ulceration on the ulnar aspect of his left middle finger.  He returns today for post operative evaluation.   Ulceration left middle finger is healing. Left AC incision is well healed.  He states that the numbness in his left hand has decreased to only occasional numbness in his left 4th and 5th fingers, and that his left hand feels much better.   He dialyzes [facility] left upper arm AVF at [facility] on [address], [place].  He states his left hand was bitten by a spider a few years ago, and he has some contractions in his left 4th and 5th fingers since this, had several procedures on his left hand due to the injury sustained from the spider bite.    Past Medical History:  Diagnosis Date  . Cardiomyopathy ([place])   . Cataract    right eye  . Diabetes mellitus    type 2  . Diabetic foot ulcer ([place])   . Diabetic infection of right foot ([place])   . Diabetic peripheral neuropathy ([place])   . ESRD (end stage renal disease) ([place])    m-w-f [facility] [address]  . Glaucoma    left eye  . Headache   . HTN (hypertension)    reports not needed medication for 2 years  . Hypotension   . Osteomyelitis ([place])   . Peripheral vascular disease ([place])    critical limb ischemia    Social History Social History   Tobacco Use  . Smoking status: Former Smoker    Packs/day: 0.50    Years: 4.00    Pack years: 2.00    Types: Cigarettes  . Smokeless tobacco: Never Used  . Tobacco comment: about 40 yrs ago  Substance Use Topics  .  Alcohol use: No    Alcohol/week: 0.0 standard drinks  . Drug use: No    Family History Family History  Problem Relation Age of Onset  . Heart disease Mother   . Diabetes Father   . Diabetes Brother   . Diabetes Sister   . Diabetes Brother   . Diabetes Brother     Surgical History Past Surgical History:  Procedure Laterality Date  . ABDOMINAL AORTOGRAM N/A 05/04/2018   Procedure: ABDOMINAL AORTOGRAM;  Surgeon: Conrad Hornersville, MD;  Location: [facility];  Service: Cardiovascular;  Laterality: N/A;  . AMPUTATION Left 11/20/2018   Procedure: AMPUTATION BELOW KNEE;  Surgeon: Dorna Leitz, MD;  Location: [place];  Service: Orthopedics;  Laterality: Left;  . AMPUTATION TOE Right 01/21/2017   Procedure: AMPUTATION 2ND TOE RIGHT FOOT ;  Surgeon: Dorna Leitz, MD;  Location: [place];  Service: Orthopedics;  Laterality: Right;  . AMPUTATION TOE Left 05/18/2017   Procedure: 5TH RAY AMPUTATION  and EXCISION CUBOID;  Surgeon: Dorna Leitz, MD;  Location: [place];  Service: Orthopedics;  Laterality: Left;  . AMPUTATION TOE Bilateral 01/27/2018   Procedure: RIGHT FOOT 3RD TOE AMPUTATION WITH INCISIONAL WOUND DEBRIDEMENT. LEFT FOOT 2ND TOE AMPUTATION AND INCISIONAL WOUND DEBRIDEMENT.;  Surgeon: Dorna Leitz, MD;  Location: [facility];  Service: Orthopedics;  Laterality: Bilateral;  . AV FISTULA PLACEMENT Left 11/27/2014   Procedure: ARTERIOVENOUS (AV) FISTULA CREATION;  Surgeon: Conrad [place], MD;  Location: [facility] [place];  Service: Vascular;  Laterality: Left;  . CHOLECYSTECTOMY    . COLONOSCOPY N/A 11/28/2014   Procedure: COLONOSCOPY;  Surgeon: Ladene Artist, MD;  Location: [facility];  Service: Endoscopy;  Laterality: N/A;  . ESOPHAGOGASTRODUODENOSCOPY N/A 11/28/2014   Procedure: ESOPHAGOGASTRODUODENOSCOPY (EGD);  Surgeon: Ladene Artist, MD;  Location: [facility];  Service: Endoscopy;  Laterality: N/A;  . EYE SURGERY     retina and cataract left eye surgery  . I&D EXTREMITY  01/28/2012   Procedure:  IRRIGATION AND DEBRIDEMENT EXTREMITY;  Surgeon: Paulene Floor, MD;  Location: [facility];  Service: Orthopedics;  Laterality: Left;  irrigation and drainage left hand  . I&D EXTREMITY  01/30/2012   Procedure: IRRIGATION AND DEBRIDEMENT EXTREMITY;  Surgeon: Paulene Floor, MD;  Location: [facility];  Service: Orthopedics;  Laterality: Left;  flexor teno synovectomy and extensor tenosynovectomy arthrosynovectomy wristjoint  . I&D EXTREMITY  02/01/2012   Procedure: IRRIGATION AND DEBRIDEMENT EXTREMITY;  Surgeon: Paulene Floor, MD;  Location: [facility];  Service: Orthopedics;  Laterality: Left;  Left Wrist, Hand and Forearm  . IRRIGATION AND DEBRIDEMENT FOOT Left 01/21/2017   Procedure: DEBRIDEMENT LEFT PLANTAR WOUND;  Surgeon: Dorna Leitz, MD;  Location: [place];  Service: Orthopedics;  Laterality: Left;  . Left 4th toe amputation    . LEFT AND RIGHT HEART CATHETERIZATION WITH CORONARY ANGIOGRAM N/A 11/26/2014   Procedure: LEFT AND RIGHT HEART CATHETERIZATION WITH CORONARY ANGIOGRAM;  Surgeon: Leonie Man, MD;  Location: [facility];  Service: Cardiovascular;  Laterality: N/A;  . LOWER EXTREMITY ANGIOGRAPHY Bilateral 05/04/2018   Procedure: Lower Extremity Angiography;  Surgeon: Conrad Rosston, MD;  Location: [facility];  Service: Cardiovascular;  Laterality: Bilateral;  . LOWER EXTREMITY ANGIOGRAPHY Left 05/11/2018   Procedure: LOWER EXTREMITY ANGIOGRAPHY;  Surgeon: Conrad Casmalia, MD;  Location: [facility] [facility];  Service: Cardiovascular;  Laterality: Left;  . PERIPHERAL VASCULAR BALLOON ANGIOPLASTY Left 05/11/2018   Procedure: PERIPHERAL VASCULAR BALLOON ANGIOPLASTY;  Surgeon: Conrad Montpelier, MD;  Location: [place] [facility];  Service: Cardiovascular;  Laterality: Left;  Posterior tibial  . PERIPHERAL VASCULAR INTERVENTION Left 05/11/2018   Procedure: PERIPHERAL VASCULAR INTERVENTION;  Surgeon: Conrad Fairland, MD;  Location: [facility];  Service: Cardiovascular;  Laterality: Left;   tibioperoneal trunk  . REVISION OF ARTERIOVENOUS GORETEX GRAFT Left 06/19/2019   Procedure: BANDING OF ARTERIOVENOUS FISTULA LEFT ARM;  Surgeon: Angelia Mould, MD;  Location: [place];  Service: Vascular;  Laterality: Left;  . TEE WITHOUT CARDIOVERSION N/A 11/20/2018   Procedure: TRANSESOPHAGEAL ECHOCARDIOGRAM (TEE);  Surgeon: Jerline Pain, MD;  Location[facility];  Service: Cardiovascular;  Laterality: N/A;  . TONSILLECTOMY      Allergies  Allergen Reactions  . Penicillins Hives, Swelling and Other (See Comments)    Ankle swelling Has patient had a PCN reaction causing immediate rash, facial/tongue/throat swelling, SOB or lightheadedness with hypotension: Yes Has patient had a PCN reaction causing severe rash involving mucus membranes or skin necrosis: No Has patient had a PCN reaction that required hospitalization: No Has patient had a PCN reaction occurring within the last 10 years: No If all of the above answers are "NO", then may proceed with Cephalosporin use.    Current  Outpatient Medications  Medication Sig Dispense Refill  . acetaminophen (TYLENOL) 325 MG tablet Take 650 mg by mouth every 6 (six) hours as needed (pain).     Marland Kitchen atorvastatin (LIPITOR) 40 MG tablet Take 40 mg by mouth at bedtime.     . Continuous Blood Gluc Sensor (FREESTYLE LIBRE 14 DAY SENSOR) MISC 1 Cartridge by Does not apply route every 14 (fourteen) days. 6 each 0  . doxycycline (VIBRA-TABS) 100 MG tablet Take 100 mg by mouth 2 (two) times daily.    . ferric citrate (AURYXIA) 1 GM 210 MG(Fe) tablet Take 420 mg by mouth 3 (three) times daily with meals.     Marland Kitchen glucose monitoring kit (FREESTYLE) monitoring kit 1 each by Does not apply route 4 (four) times daily - after meals and at bedtime. 1 month Diabetic Testing Supplies for QAC-QHS accuchecks.Any brand OK 1 each 1  . hydrocerin (EUCERIN) CREA Apply 1 application topically 2 (two) times daily. To right leg and (Patient taking differently: Apply 1 application  topically daily. To right leg and) 454 g 0  . insulin NPH-regular Human (NOVOLIN 70/30) (70-30) 100 UNIT/ML injection Inject 5-11 Units into the skin 3 (three) times daily before meals. Sliding Scale - based on CBG    . Insulin Syringe-Needle U-100 25G X 1" 1 ML MISC Any brand, for 4 times a day insulin SQ, 1 month supply. 30 each 0  . multivitamin (RENA-VIT) TABS tablet Take 1 tablet by mouth daily.    . mupirocin ointment (BACTROBAN) 2 % Apply 1 application topically daily as needed (rassh).   0  . oxyCODONE (ROXICODONE) 5 MG immediate release tablet Take 1 tablet (5 mg total) by mouth every 4 (four) hours as needed. 10 tablet 0  . primidone (MYSOLINE) 50 MG tablet Take 1 tablet (50 mg total) by mouth at bedtime. 30 tablet 0  . timolol (TIMOPTIC) 0.5 % ophthalmic solution Place 1 drop into the left eye every morning.      No current facility-administered medications for this visit.      REVIEW OF SYSTEMS: see HPI for pertinent positives and negatives    PHYSICAL EXAMINATION:  Vitals:   07/27/19 0852  BP: 113/74  Pulse: 91  Resp: 18  Temp: 98.3 F (36.8 C)  TempSrc: Temporal  SpO2: 98%  Weight: 211 lb 14.4 oz (96.1 kg)  Height: _0  (1.854 m)   Body mass index is 27.96 kg/m.  General: The patient appears his stated age.   HEENT:  No gross abnormalities Pulmonary: Respirations are non-labored Musculoskeletal: There are no major deformities.  Left 4th and 5th fingers with mild contractions Neurologic: No focal weakness or paresthesias are detected. Tremor of left hand. Skin: There no rashes noted. See photo below  Healing left middle finger ulceration  Psychiatric: The patient has normal affect. Cardiovascular: There is a regular rate and rhythm   Medical Decision Making  [name] Holness. is a 68 y.o. male who is s/p banding of left brachiocephalic AV fistula on 6-81-15 by Dr. Scot Dock. Pt had a left brachiocephalic fistula placed in December 2015.  For the prior 3  months pt had been having some numbness in his hand when on dialysis.  6 weeks prior he developed a superficial ulceration on the ulnar aspect of his left middle finger.   Left middle finger ulceration is healing well.  Left hand steal symptoms are mostly resolved.   He states that he is scheduled for a fistulagram on 07-31-19 at  [facility] as he is not able to be adequately dialyzed. On exam today there is no palpable thrill or audible bruit at the area the needles are inserted, but there is a good thrill and bruit at the distal portion of the AVF, at the [facility] area.  Incision at the [facility] area is well healed.   Follow up with [place] as needed.   Clemon Chambers, RN, MSN, FNP-C [facility] Office: [phone]  07/27/2019, 8:57 AM  Clinic MD: Donzetta Matters

## 2019-07-28 DIAGNOSIS — E1122 Type 2 diabetes mellitus with diabetic chronic kidney disease: Secondary | ICD-10-CM | POA: Diagnosis not present

## 2019-07-28 DIAGNOSIS — Z992 Dependence on renal dialysis: Secondary | ICD-10-CM | POA: Diagnosis not present

## 2019-07-28 DIAGNOSIS — N186 End stage renal disease: Secondary | ICD-10-CM | POA: Diagnosis not present

## 2019-07-31 DIAGNOSIS — I871 Compression of vein: Secondary | ICD-10-CM | POA: Diagnosis not present

## 2019-07-31 DIAGNOSIS — Z992 Dependence on renal dialysis: Secondary | ICD-10-CM | POA: Diagnosis not present

## 2019-07-31 DIAGNOSIS — N186 End stage renal disease: Secondary | ICD-10-CM | POA: Diagnosis not present

## 2019-07-31 DIAGNOSIS — T82858A Stenosis of vascular prosthetic devices, implants and grafts, initial encounter: Secondary | ICD-10-CM | POA: Diagnosis not present

## 2019-07-31 DIAGNOSIS — N2581 Secondary hyperparathyroidism of renal origin: Secondary | ICD-10-CM | POA: Diagnosis not present

## 2019-08-01 DIAGNOSIS — N2581 Secondary hyperparathyroidism of renal origin: Secondary | ICD-10-CM | POA: Diagnosis not present

## 2019-08-01 DIAGNOSIS — Z992 Dependence on renal dialysis: Secondary | ICD-10-CM | POA: Diagnosis not present

## 2019-08-01 DIAGNOSIS — N186 End stage renal disease: Secondary | ICD-10-CM | POA: Diagnosis not present

## 2019-08-02 ENCOUNTER — Encounter (HOSPITAL_BASED_OUTPATIENT_CLINIC_OR_DEPARTMENT_OTHER): Payer: Self-pay

## 2019-08-02 ENCOUNTER — Other Ambulatory Visit: Payer: Self-pay

## 2019-08-02 ENCOUNTER — Encounter (HOSPITAL_BASED_OUTPATIENT_CLINIC_OR_DEPARTMENT_OTHER): Payer: Medicare Other | Attending: Internal Medicine

## 2019-08-02 DIAGNOSIS — E1122 Type 2 diabetes mellitus with diabetic chronic kidney disease: Secondary | ICD-10-CM | POA: Diagnosis not present

## 2019-08-02 DIAGNOSIS — I132 Hypertensive heart and chronic kidney disease with heart failure and with stage 5 chronic kidney disease, or end stage renal disease: Secondary | ICD-10-CM | POA: Diagnosis not present

## 2019-08-02 DIAGNOSIS — Z89512 Acquired absence of left leg below knee: Secondary | ICD-10-CM | POA: Diagnosis not present

## 2019-08-02 DIAGNOSIS — I70209 Unspecified atherosclerosis of native arteries of extremities, unspecified extremity: Secondary | ICD-10-CM | POA: Diagnosis not present

## 2019-08-02 DIAGNOSIS — E11621 Type 2 diabetes mellitus with foot ulcer: Secondary | ICD-10-CM | POA: Diagnosis not present

## 2019-08-02 DIAGNOSIS — N186 End stage renal disease: Secondary | ICD-10-CM | POA: Diagnosis not present

## 2019-08-02 DIAGNOSIS — E1140 Type 2 diabetes mellitus with diabetic neuropathy, unspecified: Secondary | ICD-10-CM | POA: Diagnosis not present

## 2019-08-02 DIAGNOSIS — L97512 Non-pressure chronic ulcer of other part of right foot with fat layer exposed: Secondary | ICD-10-CM | POA: Diagnosis not present

## 2019-08-02 DIAGNOSIS — L84 Corns and callosities: Secondary | ICD-10-CM | POA: Diagnosis not present

## 2019-08-02 DIAGNOSIS — I509 Heart failure, unspecified: Secondary | ICD-10-CM | POA: Diagnosis not present

## 2019-08-02 DIAGNOSIS — E1151 Type 2 diabetes mellitus with diabetic peripheral angiopathy without gangrene: Secondary | ICD-10-CM | POA: Diagnosis not present

## 2019-08-03 DIAGNOSIS — Z992 Dependence on renal dialysis: Secondary | ICD-10-CM | POA: Diagnosis not present

## 2019-08-03 DIAGNOSIS — N2581 Secondary hyperparathyroidism of renal origin: Secondary | ICD-10-CM | POA: Diagnosis not present

## 2019-08-03 DIAGNOSIS — N186 End stage renal disease: Secondary | ICD-10-CM | POA: Diagnosis not present

## 2019-08-06 DIAGNOSIS — N186 End stage renal disease: Secondary | ICD-10-CM | POA: Diagnosis not present

## 2019-08-06 DIAGNOSIS — N2581 Secondary hyperparathyroidism of renal origin: Secondary | ICD-10-CM | POA: Diagnosis not present

## 2019-08-06 DIAGNOSIS — Z992 Dependence on renal dialysis: Secondary | ICD-10-CM | POA: Diagnosis not present

## 2019-08-08 DIAGNOSIS — N186 End stage renal disease: Secondary | ICD-10-CM | POA: Diagnosis not present

## 2019-08-08 DIAGNOSIS — N2581 Secondary hyperparathyroidism of renal origin: Secondary | ICD-10-CM | POA: Diagnosis not present

## 2019-08-08 DIAGNOSIS — Z992 Dependence on renal dialysis: Secondary | ICD-10-CM | POA: Diagnosis not present

## 2019-08-09 DIAGNOSIS — L84 Corns and callosities: Secondary | ICD-10-CM | POA: Diagnosis not present

## 2019-08-09 DIAGNOSIS — I70209 Unspecified atherosclerosis of native arteries of extremities, unspecified extremity: Secondary | ICD-10-CM | POA: Diagnosis not present

## 2019-08-09 DIAGNOSIS — L97512 Non-pressure chronic ulcer of other part of right foot with fat layer exposed: Secondary | ICD-10-CM | POA: Diagnosis not present

## 2019-08-09 DIAGNOSIS — E1151 Type 2 diabetes mellitus with diabetic peripheral angiopathy without gangrene: Secondary | ICD-10-CM | POA: Diagnosis not present

## 2019-08-09 DIAGNOSIS — E11621 Type 2 diabetes mellitus with foot ulcer: Secondary | ICD-10-CM | POA: Diagnosis not present

## 2019-08-09 DIAGNOSIS — I132 Hypertensive heart and chronic kidney disease with heart failure and with stage 5 chronic kidney disease, or end stage renal disease: Secondary | ICD-10-CM | POA: Diagnosis not present

## 2019-08-10 DIAGNOSIS — N2581 Secondary hyperparathyroidism of renal origin: Secondary | ICD-10-CM | POA: Diagnosis not present

## 2019-08-10 DIAGNOSIS — N186 End stage renal disease: Secondary | ICD-10-CM | POA: Diagnosis not present

## 2019-08-10 DIAGNOSIS — Z992 Dependence on renal dialysis: Secondary | ICD-10-CM | POA: Diagnosis not present

## 2019-08-13 DIAGNOSIS — Z992 Dependence on renal dialysis: Secondary | ICD-10-CM | POA: Diagnosis not present

## 2019-08-13 DIAGNOSIS — N186 End stage renal disease: Secondary | ICD-10-CM | POA: Diagnosis not present

## 2019-08-13 DIAGNOSIS — N2581 Secondary hyperparathyroidism of renal origin: Secondary | ICD-10-CM | POA: Diagnosis not present

## 2019-08-15 DIAGNOSIS — Z992 Dependence on renal dialysis: Secondary | ICD-10-CM | POA: Diagnosis not present

## 2019-08-15 DIAGNOSIS — N186 End stage renal disease: Secondary | ICD-10-CM | POA: Diagnosis not present

## 2019-08-15 DIAGNOSIS — N2581 Secondary hyperparathyroidism of renal origin: Secondary | ICD-10-CM | POA: Diagnosis not present

## 2019-08-16 DIAGNOSIS — E11621 Type 2 diabetes mellitus with foot ulcer: Secondary | ICD-10-CM | POA: Diagnosis not present

## 2019-08-16 DIAGNOSIS — I70209 Unspecified atherosclerosis of native arteries of extremities, unspecified extremity: Secondary | ICD-10-CM | POA: Diagnosis not present

## 2019-08-16 DIAGNOSIS — I132 Hypertensive heart and chronic kidney disease with heart failure and with stage 5 chronic kidney disease, or end stage renal disease: Secondary | ICD-10-CM | POA: Diagnosis not present

## 2019-08-16 DIAGNOSIS — E1151 Type 2 diabetes mellitus with diabetic peripheral angiopathy without gangrene: Secondary | ICD-10-CM | POA: Diagnosis not present

## 2019-08-16 DIAGNOSIS — L84 Corns and callosities: Secondary | ICD-10-CM | POA: Diagnosis not present

## 2019-08-16 DIAGNOSIS — L97512 Non-pressure chronic ulcer of other part of right foot with fat layer exposed: Secondary | ICD-10-CM | POA: Diagnosis not present

## 2019-08-17 DIAGNOSIS — N2581 Secondary hyperparathyroidism of renal origin: Secondary | ICD-10-CM | POA: Diagnosis not present

## 2019-08-17 DIAGNOSIS — Z992 Dependence on renal dialysis: Secondary | ICD-10-CM | POA: Diagnosis not present

## 2019-08-17 DIAGNOSIS — N186 End stage renal disease: Secondary | ICD-10-CM | POA: Diagnosis not present

## 2019-08-20 DIAGNOSIS — Z992 Dependence on renal dialysis: Secondary | ICD-10-CM | POA: Diagnosis not present

## 2019-08-20 DIAGNOSIS — N2581 Secondary hyperparathyroidism of renal origin: Secondary | ICD-10-CM | POA: Diagnosis not present

## 2019-08-20 DIAGNOSIS — N186 End stage renal disease: Secondary | ICD-10-CM | POA: Diagnosis not present

## 2019-08-22 DIAGNOSIS — Z992 Dependence on renal dialysis: Secondary | ICD-10-CM | POA: Diagnosis not present

## 2019-08-22 DIAGNOSIS — N186 End stage renal disease: Secondary | ICD-10-CM | POA: Diagnosis not present

## 2019-08-22 DIAGNOSIS — N2581 Secondary hyperparathyroidism of renal origin: Secondary | ICD-10-CM | POA: Diagnosis not present

## 2019-08-23 DIAGNOSIS — I70209 Unspecified atherosclerosis of native arteries of extremities, unspecified extremity: Secondary | ICD-10-CM | POA: Diagnosis not present

## 2019-08-23 DIAGNOSIS — L84 Corns and callosities: Secondary | ICD-10-CM | POA: Diagnosis not present

## 2019-08-23 DIAGNOSIS — E1151 Type 2 diabetes mellitus with diabetic peripheral angiopathy without gangrene: Secondary | ICD-10-CM | POA: Diagnosis not present

## 2019-08-23 DIAGNOSIS — E11621 Type 2 diabetes mellitus with foot ulcer: Secondary | ICD-10-CM | POA: Diagnosis not present

## 2019-08-23 DIAGNOSIS — L97512 Non-pressure chronic ulcer of other part of right foot with fat layer exposed: Secondary | ICD-10-CM | POA: Diagnosis not present

## 2019-08-23 DIAGNOSIS — I132 Hypertensive heart and chronic kidney disease with heart failure and with stage 5 chronic kidney disease, or end stage renal disease: Secondary | ICD-10-CM | POA: Diagnosis not present

## 2019-08-24 DIAGNOSIS — N2581 Secondary hyperparathyroidism of renal origin: Secondary | ICD-10-CM | POA: Diagnosis not present

## 2019-08-24 DIAGNOSIS — N186 End stage renal disease: Secondary | ICD-10-CM | POA: Diagnosis not present

## 2019-08-24 DIAGNOSIS — Z992 Dependence on renal dialysis: Secondary | ICD-10-CM | POA: Diagnosis not present

## 2019-08-27 DIAGNOSIS — N2581 Secondary hyperparathyroidism of renal origin: Secondary | ICD-10-CM | POA: Diagnosis not present

## 2019-08-27 DIAGNOSIS — Z992 Dependence on renal dialysis: Secondary | ICD-10-CM | POA: Diagnosis not present

## 2019-08-27 DIAGNOSIS — N186 End stage renal disease: Secondary | ICD-10-CM | POA: Diagnosis not present

## 2019-08-28 DIAGNOSIS — Z992 Dependence on renal dialysis: Secondary | ICD-10-CM | POA: Diagnosis not present

## 2019-08-28 DIAGNOSIS — E1122 Type 2 diabetes mellitus with diabetic chronic kidney disease: Secondary | ICD-10-CM | POA: Diagnosis not present

## 2019-08-28 DIAGNOSIS — N186 End stage renal disease: Secondary | ICD-10-CM | POA: Diagnosis not present

## 2019-08-29 DIAGNOSIS — Z992 Dependence on renal dialysis: Secondary | ICD-10-CM | POA: Diagnosis not present

## 2019-08-29 DIAGNOSIS — N2581 Secondary hyperparathyroidism of renal origin: Secondary | ICD-10-CM | POA: Diagnosis not present

## 2019-08-29 DIAGNOSIS — N186 End stage renal disease: Secondary | ICD-10-CM | POA: Diagnosis not present

## 2019-08-29 DIAGNOSIS — Z23 Encounter for immunization: Secondary | ICD-10-CM | POA: Diagnosis not present

## 2019-08-30 ENCOUNTER — Encounter (HOSPITAL_BASED_OUTPATIENT_CLINIC_OR_DEPARTMENT_OTHER): Payer: Medicare Other | Attending: Internal Medicine

## 2019-08-30 ENCOUNTER — Other Ambulatory Visit (HOSPITAL_COMMUNITY)
Admission: RE | Admit: 2019-08-30 | Discharge: 2019-08-30 | Disposition: A | Discharge status: Home / Self Care | Payer: Medicare Other | Source: Other Acute Inpatient Hospital | Attending: Internal Medicine | Admitting: Internal Medicine

## 2019-08-30 DIAGNOSIS — I70298 Other atherosclerosis of native arteries of extremities, other extremity: Secondary | ICD-10-CM | POA: Diagnosis not present

## 2019-08-30 DIAGNOSIS — I509 Heart failure, unspecified: Secondary | ICD-10-CM | POA: Diagnosis not present

## 2019-08-30 DIAGNOSIS — E1140 Type 2 diabetes mellitus with diabetic neuropathy, unspecified: Secondary | ICD-10-CM | POA: Diagnosis not present

## 2019-08-30 DIAGNOSIS — N186 End stage renal disease: Secondary | ICD-10-CM | POA: Diagnosis not present

## 2019-08-30 DIAGNOSIS — E11621 Type 2 diabetes mellitus with foot ulcer: Secondary | ICD-10-CM | POA: Insufficient documentation

## 2019-08-30 DIAGNOSIS — E1122 Type 2 diabetes mellitus with diabetic chronic kidney disease: Secondary | ICD-10-CM | POA: Insufficient documentation

## 2019-08-30 DIAGNOSIS — I132 Hypertensive heart and chronic kidney disease with heart failure and with stage 5 chronic kidney disease, or end stage renal disease: Secondary | ICD-10-CM | POA: Insufficient documentation

## 2019-08-30 DIAGNOSIS — L97812 Non-pressure chronic ulcer of other part of right lower leg with fat layer exposed: Secondary | ICD-10-CM | POA: Diagnosis not present

## 2019-08-30 DIAGNOSIS — L97512 Non-pressure chronic ulcer of other part of right foot with fat layer exposed: Secondary | ICD-10-CM | POA: Diagnosis not present

## 2019-08-31 ENCOUNTER — Other Ambulatory Visit: Payer: Self-pay

## 2019-08-31 DIAGNOSIS — Z992 Dependence on renal dialysis: Secondary | ICD-10-CM | POA: Diagnosis not present

## 2019-08-31 DIAGNOSIS — N2581 Secondary hyperparathyroidism of renal origin: Secondary | ICD-10-CM | POA: Diagnosis not present

## 2019-08-31 DIAGNOSIS — Z23 Encounter for immunization: Secondary | ICD-10-CM | POA: Diagnosis not present

## 2019-08-31 DIAGNOSIS — N186 End stage renal disease: Secondary | ICD-10-CM | POA: Diagnosis not present

## 2019-09-01 LAB — AEROBIC CULTURE W GRAM STAIN (SUPERFICIAL SPECIMEN)

## 2019-09-03 DIAGNOSIS — Z23 Encounter for immunization: Secondary | ICD-10-CM | POA: Diagnosis not present

## 2019-09-03 DIAGNOSIS — N2581 Secondary hyperparathyroidism of renal origin: Secondary | ICD-10-CM | POA: Diagnosis not present

## 2019-09-03 DIAGNOSIS — N186 End stage renal disease: Secondary | ICD-10-CM | POA: Diagnosis not present

## 2019-09-03 DIAGNOSIS — Z992 Dependence on renal dialysis: Secondary | ICD-10-CM | POA: Diagnosis not present

## 2019-09-04 ENCOUNTER — Ambulatory Visit (HOSPITAL_COMMUNITY)
Admission: RE | Admit: 2019-09-04 | Discharge: 2019-09-04 | Disposition: A | Discharge status: Home / Self Care | Payer: Medicare Other | Source: Ambulatory Visit | Attending: Internal Medicine | Admitting: Internal Medicine

## 2019-09-04 ENCOUNTER — Other Ambulatory Visit: Payer: Self-pay

## 2019-09-04 ENCOUNTER — Other Ambulatory Visit (HOSPITAL_COMMUNITY): Payer: Self-pay | Admitting: Internal Medicine

## 2019-09-04 DIAGNOSIS — L97509 Non-pressure chronic ulcer of other part of unspecified foot with unspecified severity: Secondary | ICD-10-CM | POA: Insufficient documentation

## 2019-09-04 DIAGNOSIS — E11621 Type 2 diabetes mellitus with foot ulcer: Secondary | ICD-10-CM

## 2019-09-04 DIAGNOSIS — E1169 Type 2 diabetes mellitus with other specified complication: Secondary | ICD-10-CM | POA: Diagnosis not present

## 2019-09-04 DIAGNOSIS — L97519 Non-pressure chronic ulcer of other part of right foot with unspecified severity: Secondary | ICD-10-CM | POA: Diagnosis not present

## 2019-09-04 DIAGNOSIS — M869 Osteomyelitis, unspecified: Secondary | ICD-10-CM | POA: Diagnosis not present

## 2019-09-05 DIAGNOSIS — N186 End stage renal disease: Secondary | ICD-10-CM | POA: Diagnosis not present

## 2019-09-05 DIAGNOSIS — Z23 Encounter for immunization: Secondary | ICD-10-CM | POA: Diagnosis not present

## 2019-09-05 DIAGNOSIS — Z992 Dependence on renal dialysis: Secondary | ICD-10-CM | POA: Diagnosis not present

## 2019-09-05 DIAGNOSIS — N2581 Secondary hyperparathyroidism of renal origin: Secondary | ICD-10-CM | POA: Diagnosis not present

## 2019-09-07 DIAGNOSIS — N186 End stage renal disease: Secondary | ICD-10-CM | POA: Diagnosis not present

## 2019-09-07 DIAGNOSIS — Z23 Encounter for immunization: Secondary | ICD-10-CM | POA: Diagnosis not present

## 2019-09-07 DIAGNOSIS — N2581 Secondary hyperparathyroidism of renal origin: Secondary | ICD-10-CM | POA: Diagnosis not present

## 2019-09-07 DIAGNOSIS — Z992 Dependence on renal dialysis: Secondary | ICD-10-CM | POA: Diagnosis not present

## 2019-09-10 DIAGNOSIS — N2581 Secondary hyperparathyroidism of renal origin: Secondary | ICD-10-CM | POA: Diagnosis not present

## 2019-09-10 DIAGNOSIS — Z992 Dependence on renal dialysis: Secondary | ICD-10-CM | POA: Diagnosis not present

## 2019-09-10 DIAGNOSIS — N186 End stage renal disease: Secondary | ICD-10-CM | POA: Diagnosis not present

## 2019-09-10 DIAGNOSIS — Z23 Encounter for immunization: Secondary | ICD-10-CM | POA: Diagnosis not present

## 2019-09-11 DIAGNOSIS — I70298 Other atherosclerosis of native arteries of extremities, other extremity: Secondary | ICD-10-CM | POA: Diagnosis not present

## 2019-09-11 DIAGNOSIS — L97512 Non-pressure chronic ulcer of other part of right foot with fat layer exposed: Secondary | ICD-10-CM | POA: Diagnosis not present

## 2019-09-11 DIAGNOSIS — E1122 Type 2 diabetes mellitus with diabetic chronic kidney disease: Secondary | ICD-10-CM | POA: Diagnosis not present

## 2019-09-11 DIAGNOSIS — L97812 Non-pressure chronic ulcer of other part of right lower leg with fat layer exposed: Secondary | ICD-10-CM | POA: Diagnosis not present

## 2019-09-11 DIAGNOSIS — I132 Hypertensive heart and chronic kidney disease with heart failure and with stage 5 chronic kidney disease, or end stage renal disease: Secondary | ICD-10-CM | POA: Diagnosis not present

## 2019-09-11 DIAGNOSIS — E11621 Type 2 diabetes mellitus with foot ulcer: Secondary | ICD-10-CM | POA: Diagnosis not present

## 2019-09-11 DIAGNOSIS — N186 End stage renal disease: Secondary | ICD-10-CM | POA: Diagnosis not present

## 2019-09-12 DIAGNOSIS — N2581 Secondary hyperparathyroidism of renal origin: Secondary | ICD-10-CM | POA: Diagnosis not present

## 2019-09-12 DIAGNOSIS — Z23 Encounter for immunization: Secondary | ICD-10-CM | POA: Diagnosis not present

## 2019-09-12 DIAGNOSIS — Z992 Dependence on renal dialysis: Secondary | ICD-10-CM | POA: Diagnosis not present

## 2019-09-12 DIAGNOSIS — N186 End stage renal disease: Secondary | ICD-10-CM | POA: Diagnosis not present

## 2019-09-14 DIAGNOSIS — Z23 Encounter for immunization: Secondary | ICD-10-CM | POA: Diagnosis not present

## 2019-09-14 DIAGNOSIS — N186 End stage renal disease: Secondary | ICD-10-CM | POA: Diagnosis not present

## 2019-09-14 DIAGNOSIS — N2581 Secondary hyperparathyroidism of renal origin: Secondary | ICD-10-CM | POA: Diagnosis not present

## 2019-09-14 DIAGNOSIS — Z992 Dependence on renal dialysis: Secondary | ICD-10-CM | POA: Diagnosis not present

## 2019-09-17 DIAGNOSIS — N2581 Secondary hyperparathyroidism of renal origin: Secondary | ICD-10-CM | POA: Diagnosis not present

## 2019-09-17 DIAGNOSIS — Z23 Encounter for immunization: Secondary | ICD-10-CM | POA: Diagnosis not present

## 2019-09-17 DIAGNOSIS — Z992 Dependence on renal dialysis: Secondary | ICD-10-CM | POA: Diagnosis not present

## 2019-09-17 DIAGNOSIS — N186 End stage renal disease: Secondary | ICD-10-CM | POA: Diagnosis not present

## 2019-09-19 DIAGNOSIS — N186 End stage renal disease: Secondary | ICD-10-CM | POA: Diagnosis not present

## 2019-09-19 DIAGNOSIS — Z992 Dependence on renal dialysis: Secondary | ICD-10-CM | POA: Diagnosis not present

## 2019-09-19 DIAGNOSIS — N2581 Secondary hyperparathyroidism of renal origin: Secondary | ICD-10-CM | POA: Diagnosis not present

## 2019-09-19 DIAGNOSIS — Z23 Encounter for immunization: Secondary | ICD-10-CM | POA: Diagnosis not present

## 2019-09-20 DIAGNOSIS — I132 Hypertensive heart and chronic kidney disease with heart failure and with stage 5 chronic kidney disease, or end stage renal disease: Secondary | ICD-10-CM | POA: Diagnosis not present

## 2019-09-20 DIAGNOSIS — I70298 Other atherosclerosis of native arteries of extremities, other extremity: Secondary | ICD-10-CM | POA: Diagnosis not present

## 2019-09-20 DIAGNOSIS — E1122 Type 2 diabetes mellitus with diabetic chronic kidney disease: Secondary | ICD-10-CM | POA: Diagnosis not present

## 2019-09-20 DIAGNOSIS — E11621 Type 2 diabetes mellitus with foot ulcer: Secondary | ICD-10-CM | POA: Diagnosis not present

## 2019-09-20 DIAGNOSIS — L97512 Non-pressure chronic ulcer of other part of right foot with fat layer exposed: Secondary | ICD-10-CM | POA: Diagnosis not present

## 2019-09-20 DIAGNOSIS — N186 End stage renal disease: Secondary | ICD-10-CM | POA: Diagnosis not present

## 2019-09-20 DIAGNOSIS — L97812 Non-pressure chronic ulcer of other part of right lower leg with fat layer exposed: Secondary | ICD-10-CM | POA: Diagnosis not present

## 2019-09-22 DIAGNOSIS — Z992 Dependence on renal dialysis: Secondary | ICD-10-CM | POA: Diagnosis not present

## 2019-09-22 DIAGNOSIS — N186 End stage renal disease: Secondary | ICD-10-CM | POA: Diagnosis not present

## 2019-09-22 DIAGNOSIS — N2581 Secondary hyperparathyroidism of renal origin: Secondary | ICD-10-CM | POA: Diagnosis not present

## 2019-09-24 ENCOUNTER — Other Ambulatory Visit: Payer: Self-pay | Admitting: Internal Medicine

## 2019-09-24 DIAGNOSIS — E11621 Type 2 diabetes mellitus with foot ulcer: Secondary | ICD-10-CM

## 2019-09-24 DIAGNOSIS — Z992 Dependence on renal dialysis: Secondary | ICD-10-CM | POA: Diagnosis not present

## 2019-09-24 DIAGNOSIS — N186 End stage renal disease: Secondary | ICD-10-CM | POA: Diagnosis not present

## 2019-09-24 DIAGNOSIS — L97519 Non-pressure chronic ulcer of other part of right foot with unspecified severity: Secondary | ICD-10-CM

## 2019-09-24 DIAGNOSIS — Z23 Encounter for immunization: Secondary | ICD-10-CM | POA: Diagnosis not present

## 2019-09-24 DIAGNOSIS — N2581 Secondary hyperparathyroidism of renal origin: Secondary | ICD-10-CM | POA: Diagnosis not present

## 2019-09-26 DIAGNOSIS — Z992 Dependence on renal dialysis: Secondary | ICD-10-CM | POA: Diagnosis not present

## 2019-09-26 DIAGNOSIS — N186 End stage renal disease: Secondary | ICD-10-CM | POA: Diagnosis not present

## 2019-09-26 DIAGNOSIS — N2581 Secondary hyperparathyroidism of renal origin: Secondary | ICD-10-CM | POA: Diagnosis not present

## 2019-09-26 DIAGNOSIS — Z23 Encounter for immunization: Secondary | ICD-10-CM | POA: Diagnosis not present

## 2019-09-27 ENCOUNTER — Encounter (HOSPITAL_BASED_OUTPATIENT_CLINIC_OR_DEPARTMENT_OTHER): Payer: Medicare Other | Attending: Internal Medicine | Admitting: Internal Medicine

## 2019-09-27 ENCOUNTER — Other Ambulatory Visit: Payer: Self-pay

## 2019-09-27 DIAGNOSIS — I509 Heart failure, unspecified: Secondary | ICD-10-CM | POA: Diagnosis not present

## 2019-09-27 DIAGNOSIS — I70208 Unspecified atherosclerosis of native arteries of extremities, other extremity: Secondary | ICD-10-CM | POA: Insufficient documentation

## 2019-09-27 DIAGNOSIS — Z992 Dependence on renal dialysis: Secondary | ICD-10-CM | POA: Insufficient documentation

## 2019-09-27 DIAGNOSIS — E1151 Type 2 diabetes mellitus with diabetic peripheral angiopathy without gangrene: Secondary | ICD-10-CM | POA: Diagnosis not present

## 2019-09-27 DIAGNOSIS — E11621 Type 2 diabetes mellitus with foot ulcer: Secondary | ICD-10-CM | POA: Diagnosis not present

## 2019-09-27 DIAGNOSIS — N186 End stage renal disease: Secondary | ICD-10-CM | POA: Insufficient documentation

## 2019-09-27 DIAGNOSIS — L97812 Non-pressure chronic ulcer of other part of right lower leg with fat layer exposed: Secondary | ICD-10-CM | POA: Diagnosis not present

## 2019-09-27 DIAGNOSIS — I132 Hypertensive heart and chronic kidney disease with heart failure and with stage 5 chronic kidney disease, or end stage renal disease: Secondary | ICD-10-CM | POA: Insufficient documentation

## 2019-09-27 DIAGNOSIS — E1122 Type 2 diabetes mellitus with diabetic chronic kidney disease: Secondary | ICD-10-CM | POA: Diagnosis not present

## 2019-09-27 DIAGNOSIS — Z89421 Acquired absence of other right toe(s): Secondary | ICD-10-CM | POA: Insufficient documentation

## 2019-09-27 DIAGNOSIS — Z993 Dependence on wheelchair: Secondary | ICD-10-CM | POA: Insufficient documentation

## 2019-09-27 DIAGNOSIS — N2581 Secondary hyperparathyroidism of renal origin: Secondary | ICD-10-CM | POA: Diagnosis not present

## 2019-09-27 DIAGNOSIS — L97512 Non-pressure chronic ulcer of other part of right foot with fat layer exposed: Secondary | ICD-10-CM | POA: Diagnosis not present

## 2019-09-27 NOTE — Progress Notes (Signed)
TYRAN, HUSER ([id number]) Visit Report for 09/27/2019 Debridement Details Patient Name: Date of Service: [name], [name] 09/27/2019 3:00 PM Medical Record [id number] Patient Account Number: [id number] Date of Birth/Sex: June 02, 1951 (67 y.o. M) Treating RN: Deon Pilling Primary Care Provider: [name] Other Clinician: Referring Provider: Treating Provider/Extender:Sae Handrich, Birdena Crandall in Treatment: 15 Debridement Performed for Wound #41 Right Metatarsal head second Assessment: Performed By: Physician Ricard Dillon., MD Debridement Type: Debridement Severity of Tissue Pre Fat layer exposed Debridement: Level of Consciousness (Pre- Awake and Alert procedure): Pre-procedure Verification/Time Out Taken: Yes - 16:05 Start Time: 16:06 Pain Control: Lidocaine 4% Topical Solution Total Area Debrided (L x W): 1 (cm) x 1.2 (cm) = 1.2 (cm) Tissue and other material Viable, Non-Viable, Callus, Subcutaneous, Skin: Dermis , Fibrin/Exudate debrided: Level: Skin/Subcutaneous Tissue Debridement Description: Excisional Instrument: Curette Bleeding: Minimum Hemostasis Achieved: Pressure End Time: 16:08 Procedural Pain: 0 Post Procedural Pain: 0 Response to Treatment: Procedure was tolerated well Level of Consciousness Awake and Alert (Post-procedure): Post Debridement Measurements of Total Wound Length: (cm) 1 Width: (cm) 1.2 Depth: (cm) 0.7 Volume: (cm) 0.66 Character of Wound/Ulcer Post Improved Debridement: Severity of Tissue Post Debridement: Fat layer exposed Post Procedure Diagnosis Same as Pre-procedure Electronic Signature(s) Signed: 09/27/2019 6:13:48 PM By: Linton Ham MD Signed: 09/27/2019 6:31:45 PM By: Deon Pilling Entered By: Linton Ham on 09/27/2019 17:22:05 -------------------------------------------------------------------------------- HPI Details Patient Name: Date of Service: [name], [name] 09/27/2019 3:00  PM Medical Record [id number] Patient Account Number: [id number] Date of Birth/Sex: Treating RN: 26-May-1951 (68 y.o. [name] Primary Care Provider: [name] Other Clinician: Referring Provider: Treating Provider/Extender:Stevan Eberwein, Jeronimo Greaves, Buena Irish in Treatment: 15 History of Present Illness Location: Patient presents with a wound to bilateral feet. Quality: Patient reports experiencing essentially no pain. Severity: Mildly severe wound with no evidence of infection Duration: Patient has had the wound for greater than 2 weeks prior to presenting for treatment HPI Description: The patient is a pleasant 68 yrs old bm here for evaluation of ulcers on the plantar aspect of both feet. 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 He has a left arm graft for access. He has been trying to stay off his feet for weeks but does not seem to have any improvement in the wound. He has been seeing someone at the foot center and was referred to the Wound Care center for further evaluation. 12/30/15 the patient has 3 wounds one over the right first metatarsal head and 2 on the left foot at the left fifth and lleft first metatarsal head. All of these look relatively similar. The one over the left fifth probe to bone I could not prove that any of the others did. I note his MRI in November that did not show osteomyelitis. His peripheral pulses seem robust. All of these underwent surgical debridement to remove callus nonviable skin and subcutaneous tissue 01/06/16; the patient had his sutures removed from the right fifth ray amputation. There may be a small open part of this superiorly but otherwise the incision looks good. Areas over his right first, left first and left fifth metatarsal head all underwent surgical debridement as a varying degree of callus, skin and nonviable subcutaneous tissue. The area that is most worrisome is the  right fifth metatarsal head which has a wound probes precariously close to bone. There is no purulent drainage or erythema 01/13/16; I'm not exactly sure of the status of the right fifth  ray amputation site however he follows with Dr. [name] later this week. The area over his right first plantar metatarsal head, left first and fifth plantar metatarsal head are all in the same status. Thick circumferential callus, nonviable subcutaneous tissue. Culture of the left fifth did not culture last week 01/20/16; all of the patient's wounds appear and roughly the same state although his amputation site on the right lateral foot looks better. His wounds over the right first, left first and left fifth metatarsal heads all underwent difficult surgical debridement removing circumferential callus nonviable skin and subcutaneous tissue. There is no overt evidence of infection in these areas. MRI at the end of December of the left foot did not show osteomyelitis, right foot showed osteomyelitis of the right fifth digit he is now status post amputation. 01/27/16 the patient's wounds over his plantar first and fifth metatarsal heads on the left all appear better having been started on a total contact cast last week. They were debridement of circumferential callus and nonviable subcutaneous tissue as was the wound over the first metatarsal head. His surgical incision on the right also had some light surface debridement done. 03/23/2016 -- the patient was doing really well and most of his wounds had almost completely healed but now he came back today with a history of having a discharge from the area of his right foot on the plantar aspect and also between his first and second toe. He also has had some discharge from the left foot. Addendum: I spoke to the PA [name] at the dialysis center whose fax number is [phone]. We discussed the infection the patient has and she will put the patient on vancomycin  and Fortaz until the final culture report is back. We will fax this as soon as available. 03/30/2016 -- left foot x-ray IMPRESSION:No definitive osteomyelitis noted. X-ray of the right foot -- IMPRESSION: 1. Soft tissue swelling. Prior amputation right fifth digit. No acute or focal bony abnormality identified. If osteomyelitis remains a clinical concern, MRI can be obtained. 2. Peripheral vascular disease. His culture reports have grown an MSSA -- and we will fax this report to his hemodialysis center. He was called in a prescription of oral doxycycline but I have told her not to fill this in as he is already on IV antibiotics. 04/06/2016 -- a few days ago, I spoke to the hemodialysis center nurse who had stopped the IV antibiotics and he was given a prescription for doxycycline 100 mg by mouth twice a day for a week and he is on this at the present time. 05/11/2016 -- he has recently seen his PCP this week and his hemoglobin A1c was 7. He is working on his paperwork to get his orthotic shoes. 05/25/2016 -- -- x-ray of the left foot IMPRESSION:No acute bony abnormality. No radiographic changes of acute osteomyelitis. No change since prior study. X-ray of the right foot -- IMPRESSION: Postsurgical changes are seen involving the fifth toe. No evidence of acute osteomyelitis. he developed a large blister on the medial part of his right foot and this opened out and drain fluid. 06/01/2016 -- he has his MRI to be done this afternoon. 06/15/2016 -- MRI of the left forefoot without contrast shows 2 separate regions of cutaneous and subcutaneous edema and possible ulceration and blistering along the ball of the foot. No obvious osteomyelitis identified. MRI of the right foot showed cutaneous and subcutaneous thickening plantar to the first digit sesamoid with an ulcer crater but no  underlying osteomyelitis is identified. 06/22/2016 -- the right foot plantar ulcer has been draining a lot of  seropurulent material for the last few days. 06/30/2016 -- spoke to the dialysis center and I believe I spoke to [place] a PA at the center who discussed with me and agreed to putting Legrand Como on vancomycin until his cultures arrive. On review of his culture report no WBCs were seen or no organisms were seen and the culture was reincubated for better growth. The final report is back and there were no predominant growth including Streptococcus or Staphylococcus. Clinically though he has a lot of drainage from both wounds a lot of undermining and there is further blebs on the left foot towards the interspace between his first and second toe. 08/10/2016 -- the culture from the right foot showed normal skin flora and there was no Staphylococcus aureus was group A streptococcus isolated. 09/21/2016 -- -- MRI of the right foot was done on 09/13/2016 - IMPRESSION: Findings most consistent with acute osteomyelitis throughout the great toe, sesamoid bones and plantar aspect of the head of the first metatarsal. Fluid in the sheath of the flexor tendon of the great toe could be sympathetic but is worrisome for septic tenosynovitis. First MTP joint effusion worrisome for septic joint. He was admitted to the hospital on 09/11/2016 and treated for a fever with vancomycin and cefepime. He was seen by Dr. Bobby Rumpf of infectious disease who recommended 6 weeks treatment with vancomycin and ceftazidime with his hemodialysis and to continue to see as in the wound clinic. Vascular consult was pending. The patient was discharged home on 09/14/2016 and he would continue with IV antibiotics for 6 weeks. 09/28/16 wound appears reasonably healthy. Continuing with total contact cast 10/05/16 wound is smaller and looking healthy. Continue with total contact cast. He continues on IV antibiotics 10/12/2016 -- he has developed a new wound on the dorsal aspect of his left big toe and this is a superficial injury with no  surrounding cellulitis. He has completed 30 days of IV antibiotics and is now ready to start his hyperbaric oxygen therapy as per his insurance company's recommendation. 10/19/2016 -- after the cast was removed on the right side he has got good resolution of his ulceration on the right plantar foot. he has a new wound on the left plantar foot in the region of his fourth metatarsal and this will need sharp debridement. 10/26/2016 -- Xray of the right foot complete: IMPRESSION: Changes consistent with osteomyelitis involving the head of the first metatarsal and base of the first proximal phalanx. The sesamoid bones are also likely involved given their positioning. 11/02/2016 -- there still awaiting insurance clearance for his hyperbaric oxygen therapy and hopefully he will begin treatment soon. 11/09/2016 -- started with hyperbaric oxygen therapy and had some barotrauma to the right ear and this was seen by ENT who was prescribed Afrin drops and would probably continue with HBO and he is scheduled for myringotomy tubes on Friday 11/16/2016 -- he had his myringotomy tubes placed on Friday and has been doing much better after that with some fluid draining out after hyperbaric treatment today. The pain was much better. 11/30/2016 -- over the last 2 days he noticed a swelling and change of color of his right second toe and this had been draining minimal fluid. 12/07/2016 -- x-ray of the right foot -- IMPRESSION:1. Progressive ulceration at the distal aspect of the second digit with significant soft tissue swelling and osseous changes in the distal  phalanx compatible with osteomyelitis. 2. Chronic osteomyelitis at the first MTP joint. 3. Ulcerations at the second and third toes as well without definite osseous changes. Osteomyelitis is not excluded. 4. Fifth digit amputation. On 12/01/2016 I spoke to Dr. Bobby Rumpf, the infectious disease specialist, who kindly agreed to treat this with IV  antibiotics and he would call in the order to the dialysis center and this has been discussed in detail with the patient who will make the appropriate arrangements. The patient will also book an appointment as soon as possible to see Dr. [name] in the office. Of note the patient has not been on antibiotics this entire week as the dialysis center did not receive any orders from Dr. [name]. I got in touch with Dr. [name] who tells me the patient has an appointment to see him this coming Wednesday. 12/14/2016 -- the patient is on ceftazidime and vancomycin during his dialysis, and I understand this was put on by his nephrologist Dr. Raliegh Ip possibly after speaking with infectious disease Dr. [name] 12/23/16; patient was on my schedule today for a wound evaluation as he had difficulties with his schedule earlier this week. I note that he is on vancomycin and ceftazidine at dialysis. In spite of this he arrives today with a new wound on the base of the right second toe this easily probes to bone. The known wound at the tip of the second toe With this area. He also has a superficial area on the medial aspect of the third toe on the side of the DIP. This does not appear to have much depth. The area on the plantar left foot is a deep area but did not probe the bone 12/28/16 -- he has brought in some lab work and the most recent labs done showed a hemoglobin of 12.1 hematocrit of 36.3, neutrophils of 51% WBC count of 5.6, BN of 53, albumin of 4.1 globulin of 3.7, vancomycin 13 g per mL. 01/04/2017 -- he saw Dr. [name] who has recommended a amputation of the right second toe and he is awaiting this date. He sees Dr. Bobby Rumpf of infectious disease tomorrow. The bone culture taken on 12/28/2016 had no growth in 2 days. 01/11/2017 -- was seen by Dr. Bobby Rumpf regarding the management and has recommended a eval by vascular surgeons. He recommended to continue the antibiotics during his  hemodialysis. Xray of the left foot -- IMPRESSION: No acute fracture, dislocation, or osseous erosion identified. 01/18/2017 -- he has his vascular workup later today and he is going to have his right foot second toe amputation this coming Friday by Dr. [name]. We have also put in for a another 30 treatments with hyperbaric oxygen therapy 01/25/2017 -- I have reviewed Dr. Donnetta Hutching is vascular report from last week where he reviewed him and thought his vascular function was good enough to heal his amputation site and no further tests were recommended. He also had his orthopedic related to surgery which is still pending the notes and we will review these next week. 02/01/2017 -- the operative remote of Dr. 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 02/08/2017 --he was seen by Dr. [name] on 02/03/2017 -- patient  is on ceftaz and Vanco. after a thorough review he had recommended to continue with antibiotics during hemodialysis. The patient was seen by Dr. [name], but I do not find any follow-up note on the electronic medical record. he has removed the dressing over the right foot to remove the sutures and asked him to see him back in a month's time 02/22/2017 -- patient has been febrile and has been having symptoms of the upper respiratory tract infection but has not been checked for the flu and it's been over 4 days now. He says he is feeling better today. Some drainage between his left first and second toe and this needed to be looked at 03/08/2017 -- was seen by Dr. Bobby Rumpf on 03/07/2017 after review he will stop his antibiotics and see  him back in a month to see if he is feeling well. 03/15/2017 -- he has completed his course of hyperbaric oxygen therapy and is doing fine with his health otherwise. 03/22/2017 -- his nutritionist at the dialysis center has recommended a protein supplement to help build his collagen and we will prescribe this for him when he has the details 05/03/2017 -- he recently noticed the area on the plantar aspect of his fifth metatarsal head which opened out and had minimal drainage. 05/10/2017 -- -- x-ray of the left foot -- IMPRESSION:1. No convincing conventional radiographic evidence of active osteomyelitis.2. Active soft tissue ulceration at the tip of the fifth digit, and at the lateral aspect of the foot adjacent the base of the fifth metatarsal. 3. Surgical changes of prior fourth toe amputation. 4. 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 5. Small vessel atherosclerotic vascular calcifications. 6. 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 ======= Readmission after 5 weeks: 06/15/2017 -- the patient returns after 5 weeks having had an MRI done on 05/17/2017 MRI of the left foot without contrast showed soft tissue ulcer overlying the base of the fifth metatarsal. 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 No drainable fluid collection to suggest an abscess. He was in hospital between 05/16/2017 and 05/25/2017 -- and on discharge was asked to follow-up with Dr. [name] and Dr. [name]. He was treated 2 weeks post discharge with vancomycin plus ceftriaxone with hemodialysis and oral metronidazole 500 mg 3 times a day. 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 last hemoglobin A1c was 6.7% He had a postoperative MR of the foot on 05/23/2017 -- which showed no new areas of cortical bone loss,  edema or soft tissue ulceration to suggest osteomyelitis. the patient has completed his course of IV antibiotics during dialysis and is to see the infectious disease doctor tomorrow. Dr. [name] had seen him after suture removal and asked him to keep the wound with a dry dressing 06/21/2017 -- he was seen by Dr. Bobby Rumpf of infectious disease on 06/16/2017 -- he stopped his Flagyl and will see him back in 6 weeks. He was asked to follow-up with Dr. [name] and with the wound clinic clinic. 07/05/17; bone biopsy from last time showed acute osteomyelitis. This is from the reminiscent in the left fifth metatarsal. Culture result is apparently still pending [holding for anaerobe}. From my understanding in this case this is been a progressive necrotic wound which is deteriorated markedly over the last 3 weeks since he returned here. He now has  a large area of exposed bone which was biopsied and cultured last week. Dr. Graylon Good has put him on vancomycin and Fortaz during his hemodialysis and Flagyl orally. He is to see Dr. [name] next week 07/19/2017 -- the patient was reviewed by Dr. [name] of orthopedics who reviewed the case in detail and agreed with the plan to continue with IV antibiotics, aggressive wound care and hyperbaric oxygen therapy. He would see him back in 3 weeks' time 08/09/2017 -- saw Dr. Bobby Rumpf on 08/03/2017 -he was restarted on his antibiotics for 6 weeks which included vanco, ceftaz and Flagyl. he recommended continuing his antibiotics for 6 weeks and reevaluate his completion date. He would continue with wound care and hyperbaric oxygen therapy. 08/16/2017 -- I understand he will be completing his 6 weeks of antibiotics sometime later this week. 09/19/2017 -- he has been without his wound VAC for the last week due to lack of supplies. He has been packing his wound with silver alginate. 10/04/2017 -- he has an appointment with Dr. [name] tomorrow and hence we will not  apply the wound VAC after his dressing changes today. 10/11/2017 -- he was seen by Dr. Bobby Rumpf on 10/05/2017, noted that the patient was currently off antibiotics, and after thorough review he recommended he follow-up with the wound care as per plan and no further antibiotics at this point. 11/29/17; patient is arrived for the wound on his left lateral foot.Marland Kitchen He is completed IV antibiotics and 2 rounds of hyperbaric oxygen for treatment of underlying osteomyelitis. He arrives today with a surface on most of the wound area however our intake nurse noted drainage from the superior aspect. This was brought to my attention. 12/13/2017 -- the right plantar foot had a large bleb and once the bleb was opened out a large callus and subcutaneous debris was removed and he has a plantar ulcer near the fourth metatarsal head 12/28/17 on evaluation today patient appears to be doing acutely worse in regard to his left foot. The wound which has been appearing to do better as now open up more deeply there is bone palpable at the base of the wound unfortunately. He tells me that this feels "like it did when he had osteomyelitis previously" he also noted that his second toe on the left foot appears to be doing worse and is swollen there does appear to be some fluid collected underneath. His right foot plantar ulcer appears to be doing somewhat better at this point and there really is no complication at the site currently. No fevers, chills, nausea, or vomiting noted at this time. Patient states that he normally has no pain at this site however. Today he is having significant pain. 01/03/18; culture done last week showed methicillin sensitive staph aureus and group B strep. He was on Septra however he arrives today with a fever of 101. He has a functional dialysis shunt in his left arm but has had no pain here. No cough. He still makes some urine no dysuria he did have abdominal pain nausea and diarrhea over  the weekend but is not had any diarrhea since yesterday. Otherwise he has no specific complaints 01/05/18; the patient returns today in follow-up for his presentation from 1/8. At that point he was febrile. I gave him some Levaquin adjusted for his dialysis status. He tells me the fever broke that night and it is not likely that this was actually a wound infection. He is gone on to have an MRI of the  left foot; this showed interval development of an abnormal signal from the base of the third and fourth metatarsal and in the cuboid reminiscent consistent with osteomyelitis. There is no mention of the left second toe I think which was a concern when it was ordered. The patient is taking his Levaquin 01/10/18; the patient has completed his antibiotics today. The area on the left lateral foot is smaller but it still probes easily to bone. He has underlying osteomyelitis here and sees Dr. Megan Salon of infectious disease this week The area on the right third plantar metatarsal head still requires debridement not much change in dimensions He has a new wound on the medial tip of his right third toe again this probes to bone. He only noticed this 2 days ago 01/17/18; the patient saw Dr. [name] last week and he is back on Vanco and Fortaz at dialysis. 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 We are previously seeing him for a wound also on the base of the fourth med head on the right. X- ray of this area did not show osteomyelitis in relation to a new wound on the tip of his right third toe. Again this probes to bone. Finally his second toe on the left which didn't really show anything on the MRI at least no report appears to have a separated cutaneous area which I think is going to come right off the tip of his toe and leave exposed bone. Whether this is infectious or ischemic I am not  clear Culture I did from the third toe last week which was his new wound was negative. Plain x-ray on the right foot did not show osteomyelitis in the toes 01/24/18; the patient is going went to see Dr. [name]. He is going to have a amputation of the left second and right third toe although paradoxically the left second toe looks better than last week. We have treating a deep probing wound on the left lateral foot and the area over the third metatarsal head on the right foot. 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 Also a debridement including bone of the left lateral foot and a closure which is still sutured. This is a bit surprising. Also apparent debridement of the base of the right third toe wound. 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 I'm not sure exactly where we are in the frame of things here. The wound on the left lateral foot is still sutured also the amputation sites of the left second and right third toe. He is been using Xeroform to the sutured areas silver alginate to the right foot 02/14/18; he continues on Rocephin at dialysis for osteomyelitis. I still don't have a good sense of where we are in the treatment duration. The wounds on the left lateral foot had the sutures removed and this clearly still probes deeply. We debrided the area with a #5 curet. We'll use silver alginate to the wound the area over the right third met head also required debridement of callus skin and subcutaneous tissue and necrotic debris over the wound surface. This tunnels superiorly but I did not unroofed  this today. The areas on the tip of his toes have a crusted surface eschar I did not debridement this either. 02/21/18; he continues on Rocephin at dialysis for  osteomyelitis. I don't have a good sense of where we are and the treatment duration. The wounds on the left lateral foot is callused over on presentation but requires debridement. The area on the right third med head plantar aspect actually is measuring smaller 02/28/18;patient is on Vanco and Fortaz at dialysis, I'm not sure why I thought this was Rocephin above unless it is been changed. I don't have a good sense of time frame here. Using silver collagen to the area on the left lateral foot and right third med head plantar foot 03/07/18; patient is completing bank and Fortaz at dialysis soon. He is using Silver collagen to the area on the left lateral foot and the right third metatarsal head. He has a smaller superficial wound distal to the left lateral foot wound. 03/14/18-he is here in follow-up evaluation for multiple ulcerations to his bilateral feet. He presents with a new ulceration to the plantar aspect of the left foot underneath the blister, this was deroofed to reveal a partial thickness ulcer. He is voicing no complaints or concerns, tolerated dialysis yesterday. We will continue with same treatment plan and he will follow-up next week 03/21/18; the patient has a area on the lateral left foot which still has a small probing area. The overall surface area of the wound is better. He presented with a new ulceration on the left foot plantar fifth metatarsal head last week. The area on the right third metatarsal head appears smaller.using silver alginate to all wound areas. The patient is changing the dressing himself. He is using a Darco forefoot offloading are on the right and a healing sandal on the left 03/28/18; original wound left lateral foot. Still nowhere close to looking like a heeling surface secondary wound on the left lateral foot at the base of his question fifth metatarsal which was new last week Right third metatarsal head. We have been using silver alginate all wounds 04/04/18;  the original wound on the left lateral foot again heavy callus surrounding thick nonviable subcutaneous tissue all requiring debridement. The new wound from 2 weeks ago just near this at the base of the fifth metatarsal head still looks about the same Unfortunately there is been deterioration in the third medical head wound on the right which now probes to bone. I must say this was a superficial wound at one point in time and I really don't have a good frame of reference year. I'll have to go back to look through records about what we know about the right foot. 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 He is also been to see Dr. [name]. Somewhere in here as somebody is ordered a VASCULAR evaluation which was done on 03/22/18. On the right is anterior tibial artery was monophasic triphasic at the posterior tibial artery. On the left anterior tibial artery monophasic posterior tibial artery monophasic. ABI in the right was 1.43 on the left 1.21. TBIs on the right at 0.41 and on the left at 0.32. A vascular consult was recommended and I think has been arranged. 04/11/18; Mr. Zuercher has not had an MRI of the right foot since 2017. Recent x-ray of the right foot done in January and February was negative. However he has had a  major deterioration in the wound over the third met head. He is completed antibiotics last week at dialysis [place]. I think he is going to see vascular in follow-up. The areas on the left lateral foot and left plantar fifth metatarsal head both look satisfactory. I debrided both of these areas although the tissue here looks good. The area on the left lateral foot once probe to bone it certainly does not do that now 04/18/18; MRI of the right foot is on Thursday. He has what looks to be serosanguineous purulent drainage coming out of the wound over the right third metatarsal head  today. He completed antibiotics bank and Fortaz 2 weeks ago at dialysis. He has a vascular follow-up with regards to his arterial insufficiency although I don't exactly see when that is booked. The areas on the left foot including the lateral left foot and the plantar left fifth metatarsal head look about the same. 04/25/18-He is here in follow-up evaluation for bilateral foot ulcers. MRI obtained was negative for osteomyelitis. Wound culture was negative. We will continue with same treatment plan he'll follow-up next week 05/02/18; the right plantar foot wound over the third metatarsal head actually looks better than when I last saw this. His MRI was negative for osteomyelitis wound culture was negative. On the left plantar foot both wounds on the plantar fifth metatarsal head and on the lateral foot both are covered in a very hard circumferential callus. 05/09/18; right plantar foot wound over the third metatarsal head stable from last week. Left plantar foot wound over the fifth metatarsal head also stable but with callus around both wound areas The area on the left lateral foot had thick callus over a surface and I had some thoughts about leaving this intact however it felt boggy. We've been using silver alginate all wounds 05/16/18; since the patient was last here he was hospitalized from 5/15 through 5/19. 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 This grew MRSA. He was placed on vancomycin.MRI of the left foot suggested "progressive" osteomyelitis and bone destruction of the cuboid and the base of the fifth metatarsal. Stable erosive changes of the base of the second metatarsal. I'm wondering if they're aware that he had surgery and debridement in the area of the underlying bone previously by Dr. [name]. In any case, He was also revascularized with an angioplasty and stenting of the  left tibial peroneal trunk and angioplasty of the left posterior tibial artery. He is on vancomycin at dialysis. He has a 2 week follow-up with infectious disease. He has vascular surgery follow-up. He was started on Plavix. 05/23/18; the patient's wound on the lateral left foot at the level of the fifth metatarsal head and the plantar wound on the plantar fifth metatarsal head both look better. The area on the right third metatarsal head still has depth and undermining. We've been using silver alginate. He is on vancomycin. He has been revascularized on the left 05/30/18; he continues on vancomycin at dialysis. Revascularized on the left. The area on the left lateral foot is just about closed. Unfortunately the area over the plantar fifth metatarsal head undermining superiorly as does the area over the right third metatarsal head. Both of these significantly deteriorated from last week. He has appointments next week with infectious disease and orthopedics I delayed putting on a cast on the left until those appointments which  therefore we made we'll bring him in on Friday the 14th with the idea of a cast on the left foot 06/06/18; he continues on vancomycin at dialysis. Revascularized on the left. He arrives today with a ballotable swelling just above the area on the left lateral foot where his previous wound was. He has no pain but he is reasonably insensate. The difficult areas on the plantar left fifth metatarsal head looks stable whereas the area on the right third plantar metatarsal head about the same as last week there is undermining here although I did not unroofed this today. He required a considerable debridement of the swelling on the left lateral foot area and I unroofed an abscess with a copious amount of brown purulent material which I obtained for culture. I don't believe during his recent hospitalization he had any further imaging although I need to review this. Previous cultures  from this area done in this clinic showed MRSA, I would be surprised if this is not what this is currently even though he is on vancomycin. 06/13/18; he continues on vancomycin at dialysis however he finishes this on Sunday and then graduates the doxycycline previously prescribed by Dr. [name]. The abscess site on the lateral foot that I unroofed last time grew moderate amounts of methicillin-resistant staph aureus that is both vancomycin and tetracycline sensitive so we should be okay from that regard. He arrives today in clinic with a connection between the abscess site and the area on the lateral foot. We've been using silver alginate to all his wound areas. The right third metatarsal head wound is measuring smaller. Using silver alginate on both wound areas 06/20/18; transitioning to doxycycline prescribed by Dr. [name]. The abscess site on the lateral foot that I unroofed 2 weeks ago grew MRSA. That area has largely closed down although the lateral part of his wound on the fifth metatarsal head still probes to bone. I suspect all of this was connected. On the right third metatarsal head smaller looking wound but surrounded by nonviable tissue that once again requires debridement using silver alginate on both areas 06/27/18; on doxycycline 100 twice a day. The abscess on the lateral foot has closed down. Although he still has the wound on the left fifth metatarsal head that extends towards the lateral part of the foot. The most lateral part of this wound probes to bone Right third metatarsal head still a deep probing wound with undermining. Nevertheless I elected not to debridement this this week If everything is copious static next week on the left I'm going to attempt a total contact cast 07/04/18; he is tolerating his doxycycline. The abscess on the left lateral foot is closed down although there is still a deep wound here that probes to bone. We will use silver collagen under the total  contact cast Silver collagen to the deep wound over the right third metatarsal head is well 07/11/18; he is tolerating doxycycline. The left lateral fifth plantar metatarsal head still probes deeply but I could not probe any bone. We've been using silver collagen and this will be the second week under the total contact cast Silver collagen to deep wound over the right third metatarsal head as well 07/18/18; he is still taking doxycycline. The left lateral fifth plantar metatarsal this week probes deeply with a large amount of exposed bone. Quite a deterioration. He required extensive debridement. Specimens of bone for pathology and CNS obtained Also considerable debridement on the right third metatarsal head 07/25/18; bone for  pathology last week from the lateral left foot showed osteomyelitis. CandS of the bone Enterobacter. And Klebsiella. He is now on ciprofloxacin and the doxycycline is stopped [Dr. [name] of infectious disease] area He has an appointment with Dr. [name] tomorrow I'm going to leave the total contact cast off for this week. May wish to try to reapply that next week or the week after depending on the wound bed looks. I'm not sure if there is an operative option here, previously is followed with Dr. [name] 08/01/18 on evaluation today patient presents for reevaluation. He has been seen by infectious disease, Dr. [name], and he has placed the patient back on Ceftaz currently to be given to him at dialysis three times a week. 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 Fortunately there does not appear to be the evidence of infection at this point in time. Overall the patient has been tolerating the dressing changes without complication. We are no longer due lives in the cast of left foot secondary to the infection obviously. 08/10/18 on evaluation today patient appears to be doing rather well all things considered  in regard to his left plantar foot ulcer. He does have a significant callous on the lateral portion of his left foot he wonders if I can help clean that away to some degree today. Fortunately he is having no evidence of infection at this time which is good news. No fevers chills noted. With that being said his right plantar foot actually does have a significant callous buildup around the wound opening this seems to not be doing as well as I would like at this point. I do believe that he would benefit from sharp debridement at this site. Subsequently I think a total contact cast would be helpful for the right foot as well. 08/17/18; the patient arrived today with a total contact cast on the right foot. This actually looks quite good. He had gone to see Dr. Megan Salon of infectious disease about the osteomyelitis on the left foot. I think he is on IV Fortaz at dialysis although I am not exactly sure of the rationale for the Fortaz at the time of this dictation. He also arrived in today with a swelling on the lateral left foot this is the site of his original wounds in fact when I first saw this man when he was under Dr. Ardeen Garland care I think this was the site of where the wound was located. It was not particularly tender however using a small scalpel I opened it to [place] some moderate amount of purulent drainage. 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 Last MRI was in May that showed progressive osteomyelitis and bone destruction of the cuboid and base of the fifth metatarsal. Last week he had an abscess over this same area this was removed. Culture was negative. We are continuing with a total contact cast to the area on the third metatarsal head on the right and making some good progress here. The patient asked me again about renal transplant. He is not on the list because  of the open wounds. 08/31/18; apparently the patient had an interruption in the [place] but he is now back on this at dialysis. Apparently there was a misunderstanding and Dr. Crissie Figures orders. Due to have the MRI of the  left foot tonight. The area on the right foot continues to have callus thick skin and subcutaneous tissue around the wound edge that requires constant debridement however the wound is smaller we are using a total contact cast in this area. Alginate all wounds 09/07/18; without much surprise the MRI of his left foot showed osteomyelitis in the reminiscent of his fourth metatarsal but also the third metatarsal. She arrives in clinic today with the right foot and a total contact cast. There was purulent drainage coming out of the wound which I have cultured. Marked deterioration here with undermining widely around the wound orifice. Using pickups and a scalpel I remove callus and subcutaneous tissue from a substantial new opening. In a similar fashion the area on the left lateral foot that was blistered last week I have opened this and remove skin and subcutaneous tissue from this area to expose a obvious new wound. I think there is extension and communication between all of this on the left foot. The patient is on Fortaz at dialysis. Culture done of the right foot. We are clearly not making progress here. We have made him an appointment with Dr. [name] of orthopedics. I think an amputation of the left leg may be discussed. I don't think there is anything that can be done with foot salvage. 09/14/18; culture from the right foot last time showed a very resistant MRSA. This is resistant to doxycycline. I'm going to try to get linezolid at least for a week. He does not have a appointment with Dr. [name] yet. 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 I'll send a message to Dr. [name] of infectious disease. I want the patient to see  Dr. [name] to go over the pros and cons of an amputation which I think will be a BKA. The patient had a question about whether this is curative or not. I told him that I thought it would be although spread of staph aureus infection is not unheard of. MRI of the right foot was done at the end of April 2019 did not show osteomyelitis. The patient last saw Dr. [name] on 9/3. I'll send Dr. [name] a message. I would really like him to weigh the pros and cons of a BKA on the left otherwise he'll probably need IV antibiotics and perhaps hyperbaric oxygen again. He has not had a good response to this in the past. His MRI earlier this month showed progressive damage in the remnants of the fourth and third metatarsal 09/21/2018; sees Dr. [name] of orthopedics next week to discuss a left BKA in response to the osteomyelitis in the left foot. Using silver alginate to all wound areas. He is completing the Zyvox I did put in for him after last culture showed MRSA 09/29/2018; patient saw Dr. [name] of orthopedics discussed the osteomyelitis in the left foot third and fourth metatarsals. He did not recommend urgent surgery but certainly stated the only surgical option would be a BKA. He is communicating with Dr. [name]. The problem here is the instability of the areas on his foot which constantly generate draining abscesses. I will communicate with Dr. [name] about this. He has an appointment on 10/23 10/05/2018; patient sees Dr. [name] on 10/23. I have sent him a secure message not ordered any additional antibiotics for now. Patient continues to have 2 open areas on the left plantar foot at the fifth metatarsal head and the lateral aspect of  the foot. These have not changed all that much. The area on the right third met head still has thick callus and surrounding subcutaneous tissue we have been using silver alginate 10/12/2018; patient sees Dr. [name] or colleague on 10/23. 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 The area on the right plantar foot is just about closed. The superior wound on the left fifth metatarsal head is callused over but I am not sure if this is closed. The area below it is about the same. Considerable amount of callus on the lateral foot. We have been using silver alginate to the wounds 10/19/2018; the patient saw Dr. [name] on 10/22. He wishes to try and continue to save the left foot. He has been given 6 weeks of oral ciprofloxacin. We continue to put a total contact cast on the right foot. The area over the fifth metatarsal head on the left is callused over/perhaps healed but I did not remove the callus to find out. He still has the wound on the left lateral midfoot requiring debridement. We have been using silver alginate to all wound areas 10/26/2018 On the left foot our intake nurse noted some purulent drainage from the inferior wound [currently the only one that is not callused over] On the right foot even though he is in total contact cast a considerable amount of thick black callus and surface eschar. On this side we have been using silver alginate under a total contact cast. he remains on ciprofloxacin as prescribed by Dr. [name] 11/02/2018; culture from last week which is done of the probing area on the left midfoot wound showed MRSA "few". I am going to need to contact Dr. [name] which I will do today I think he is going to need IV antibiotics again. The area on the left foot which was callused on the side is clearly separating today all of this was removed a copious amounts of callus and necrotic subcutaneous tissue. The area on the right plantar foot actually remained healthy looking with a healthy granulated base. We are using silver alginate to all wound areas 11/09/2018; unfortunately neither 1 of the patient's wounds areas looks at all satisfactory. On the left he has considerable  necrotic debris from the plantar wound laterally over the foot. I removed copious amounts of material including callus skin and subcutaneous tissue. On the right foot he arrives with undermining and frankly purulent drainage. Specimen obtained for culture and debridement of the callused skin and subcutaneous tissue from around the circumference. Because of this I cannot put him back in a total contact cast but to be truthful we are really unfortunately not making a lot of progress I did put in a secure text message to Dr. [name] wondering about the MRSA on the left foot. He suggested vancomycin although this is not started. I was left wondering if he expected me to send this into dialysis. Has we have more purulence on the right side wait for that result before calling dialysis 11/16/2018; I called dialysis earlier this week to get the vancomycin started. I think he got the first dose on Tuesday. The patient tells me that he fell earlier this week twisting his left foot and ankle and he is swelling. He continues to not look well. He had blood cultures done at dialysis on Tuesday he is not been informed of the results. 12/07/2018; the patient was admitted to hospital from 11/22/2018 through 12/02/2018.  He underwent a left BKA. Apparently had staph sepsis. In the meantime being off the right foot this is closed over. He follows up with Dr. 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 Subsequent to this he actually had multiple falls in fact he tells me to following which calls the wound to be his apparently. He has been seeing Dr. [name] and his physician assistant in the interim. They actually did want him to come back for reevaluation to see if there's anything we can do for me wound care perspective the help this area to heal more appropriately. The patient states that he does not have  a tremendous amount of pain which is good news. No fevers, chills, nausea, or vomiting noted at this time. We have gotten approval from Dr. [name] office had [place] for [place] to treat the patient for his stop wound. This is due to the fact that the patient was in the 90 day postop global. 1/21; the patient does not have an open wound on the right foot although he does have some pressure areas that will need to be padded when he is transferring. The dehisced surgical wound looks clean although there is some undermining of 1.5 cm superiorly. 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 This appears healthy but there is undermining. TheraSkin #1 2/11; TheraSkin #2 2/25; TheraSkin #3. Wound is measuring smaller 3/10; TheraSkin #4 wound is measuring smaller 3/24; TheraSkin #5 wound is measuring smaller and looks healthy. 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 He has no open area on the right foot although he does have some callused area over the original third metatarsal head wound. The third metatarsal is also subluxed on the right foot. I have warned him today that he cannot consider wearing a prosthesis for at least a month but he can go for measurements. He is going to need to keep the right foot padded is much as possible in his diabetic shoe indefinitely READMISSION 06/12/2019 [name] is a type II diabetic on dialysis. He has been in this clinic multiple times with wounds on his bilateral lower extremities. 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 He also has a history of wounds on his right foot but tells me that everything is going well here. He actually came in here with his prosthesis walking with a cane. We were all really quite gratified to see this He states over the  last several weeks he has had a painful area at the tip of the left third finger. His dialysis shunt in his is in the left upper arm and this particularly hurts during dialysis when his fingers get numb and there is a lot of pain in the tip of his left third finger. I believe he is seen vascular surgery. He had a Doppler done to evaluate for a dialysis steal syndrome. He is going for a banding procedure 1 week from today towards the left AV fistula. I think if that is unsuccessful they will be looking at creating a new shunt. He had a course of doxycycline when he which he finished about a week ago. He has been using Bactroban to the  finger 6/25; the patient had his banding procedure and things seem to be going better. He is not having pain in his hand at dialysis. The area on the tip of his finger seems about closed. He did however have a paronychia I the last time I saw him. I have been advising him to use topical Bactroban washing the finger. Culture I did last time showed a few staph epidermidis which I was willing to dismiss as superficial skin contaminant. He arrives today with the finger wound looking better however the paronychia a seems worse 7/9; 2-week follow-up. He does not have an open wound on the tip of his third left finger. I thought he had an initial ischemic wound on the tip of the finger as well as a paronychia a medially. All of this appears to be closed. 8/6-Patient returns after 3 weeks for follow-up and has a right second met head plantar callus with a significant area of maceration hiding an ulcer ABI repeated today on right - 1.26 8/13-Patient returns at 1 week, the right second met head plantar wound appears to be slightly better, it is surrounded by the callus area we are using silver alginate 8/20; the patient came back to clinic 2 weeks ago with a wound on the right second metatarsal head. He apparently told me he developed callus in this area but also went away from  his custom shoes to a pair of running shoes. Using silver alginate. 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 There might be much we can do to offload this although the patient tells me he has a wheelchair that he is using to try and stay off the wound is much as possible 8/27; right second metatarsal head. Still small punched out area with thick callus and subcutaneous tissue around the wound. We have been using silver collagen. This is always been an issue with this man's wound especially on this foot 9/3; right second met head. Still the same punched-out area with thick callus and subcutaneous tissue around the wound removing the circumference demonstrates repetitively undermining area. I have removed all the subcutaneous tissue associated with this. We have been using silver collagen 9/15; right second metatarsal head. Same punched-out thick callus and subcutaneous tissue around the wound area. Still requiring debridement we have been using silver collagen I changed back to silver alginate X-ray last time showed no evidence of osteomyelitis. Culture showed Klebsiella and he was started and Keflex apparently just 4 days ago 9/24; right second metatarsal head. He is completed the antibiotics I gave him for 10 days. Same punched-out thick callus and subcutaneous tissue around the area. Still requiring debridement. We have been using silver alginate. The area itself looks swollen and this could be just [place] that comes on this area with walking on a prosthesis on the left however I think he needs an MRI and I am going to order that today. There is not an option to offload this further 10/1; right second plantar metatarsal head. MRI booked for October 6. Generally looking better today using silver alginate Electronic Signature(s) Signed: 09/27/2019 6:13:48 PM By: Linton Ham MD Entered By: Linton Ham on 09/27/2019  17:22:52 -------------------------------------------------------------------------------- Physical Exam Details Patient Name: Date of Service: [name], [name] 09/27/2019 3:00 PM Medical Record [id number] Patient Account Number: [id number] Date of Birth/Sex: Treating RN: [birth year] (68 y.o. [name] Primary Care Provider: [name] Other Clinician: Referring Provider: Treating Provider/Extender:Kaidynce Pfister, Jeronimo Greaves, Buena Irish in Treatment:  15 Constitutional Sitting or standing Blood Pressure is within target range for patient.. Pulse regular and within target range for patient.Marland Kitchen Respirations regular, non-labored and within target range.. Temperature is normal and within the target range for the patient.Marland Kitchen Appears in no distress. Notes Wound exam; better today. Less callus. Still requiring debridement with a #3 curette of callus subcutaneous tissue and debris over the wound surface. Hemostasis with direct pressure. There is no undermining or tunneling and in general things look somewaht Electronic Signature(s) Signed: 09/27/2019 6:13:48 PM By: Linton Ham MD Entered By: Linton Ham on 09/27/2019 17:23:40 -------------------------------------------------------------------------------- Physician Orders Details Patient Name: Date of Service: [name], [name] 09/27/2019 3:00 PM Medical Record [id number] Patient Account Number: [id number] Date of Birth/Sex: Treating RN: [birth year] (68 y.o. 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 Dressing Change Frequency Wound #41 Right Metatarsal head second Change Dressing every other day. Skin Barriers/Peri-Wound Care Barrier cream - as needed for maceration, wetness, or redness. Wound Cleansing May shower and wash wound with soap and water. Primary Wound Dressing Wound #41 Right Metatarsal head second Calcium Alginate with Silver Secondary Dressing Wound #41 Right Metatarsal head second Kerlix/Rolled Gauze Dry Gauze Other: - felt to periwound and place one below wound. lightly wrap coban to hold dressing in place. felt / pad wound Edema Control Avoid standing for long periods of time Elevate legs to the level of the heart or above for 30 minutes daily and/or when sitting, a frequency of: - throughout the day. Off-Loading Other: - felt to periwound and place one below wound. Additional Orders / Instructions Other: - minimize walking and standing on foot to aid in wound healing and callus buildup. ****Patient to ensure to remove alginate silver from wound bed, cover with gauzes, before MRI and during. Once complete redress wound with silver alginate.**** Electronic Signature(s) Signed: 09/27/2019 6:13:48 PM By: Linton Ham MD Signed: 09/27/2019 6:31:45 PM By: Deon Pilling Entered By: Deon Pilling on 09/27/2019 16:06:19 -------------------------------------------------------------------------------- Problem List Details Patient Name: Date of Service: [name], [name] 09/27/2019 3:00 PM Medical Record [id number] Patient Account Number: [id number] Date of Birth/Sex: Treating RN: 1951-05-31 (68 y.o. 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, 06/12/2019 No Yes other extremity Inactive Problems ICD-10 Code Description Active Date Inactive Date L03.114 Cellulitis of left upper limb 06/12/2019 06/12/2019 L98.498 Non-pressure chronic ulcer of skin of other sites with other 06/12/2019 06/12/2019 specified severity S61.209D Unspecified open wound of unspecified finger without damage 06/12/2019 06/12/2019 to nail, subsequent encounter Resolved Problems Electronic Signature(s) Signed: 09/27/2019 6:13:48 PM By: Linton Ham MD Entered By: Linton Ham on 09/27/2019 17:21:42 -------------------------------------------------------------------------------- Progress Note Details Patient Name: Date of Service: [name] 09/27/2019 3:00 PM Medical Record [id number] Patient Account Number: [id number] Date of Birth/Sex: Treating RN: Feb 12, 1951 (68 y.o. M)  Deon Pilling Primary Care Provider: [name] Other Clinician: Referring Provider: Treating Provider/Extender:Abbi Mancini, Jeronimo Greaves, Buena Irish in Treatment: 15 Subjective History of Present Illness (HPI) The following HPI elements were documented for the patient's wound: Location: Patient presents with a wound to bilateral feet. Quality: Patient reports experiencing essentially no pain. Severity: Mildly severe wound with no evidence of infection Duration: Patient has had the wound for greater than 2 weeks prior to presenting for treatment The patient is a pleasant 68 yrs old bm here for evaluation of ulcers on the plantar aspect of both feet. 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 He has a left arm graft for access. He has been trying to stay off his feet for weeks but does not seem to have any improvement in  the wound. He has been seeing someone at the foot center and was referred to the Wound Care center for further evaluation. 12/30/15 the patient has 3 wounds one over the right first metatarsal head and 2 on the left foot at the left fifth and lleft first metatarsal head. All of these look relatively similar. The one over the left fifth probe to bone I could not prove that any of the others did. I note his MRI in November that did not show osteomyelitis. His peripheral pulses seem robust. All of these underwent surgical debridement to remove callus nonviable skin and subcutaneous tissue 01/06/16; the patient had his sutures removed from the right fifth ray amputation. There may be a small open part of this superiorly but otherwise the incision looks good. Areas over his right first, left first and left fifth metatarsal head all underwent surgical debridement as a varying degree of callus, skin and nonviable subcutaneous tissue. The area that is most worrisome is the right fifth metatarsal head which has a wound probes precariously close to bone. There is no purulent drainage or erythema 01/13/16; I'm not exactly sure of the status of the right fifth ray amputation site however he follows with Dr. [name] later this week. The area over his right first plantar metatarsal head, left first and fifth plantar metatarsal head are all in the same status. Thick circumferential callus, nonviable subcutaneous tissue. Culture of the left fifth did not culture last week 01/20/16; all of the patient's wounds appear and roughly the same state although his amputation site on the right lateral foot looks better. His wounds over the right first, left first and left fifth metatarsal heads all underwent difficult surgical debridement removing circumferential callus nonviable skin and subcutaneous tissue. There is no overt evidence of infection in these areas. MRI at the end of December of the left foot did not show  osteomyelitis, right foot showed osteomyelitis of the right fifth digit he is now status post amputation. 01/27/16 the patient's wounds over his plantar first and fifth metatarsal heads on the left all appear better having been started on a total contact cast last week. They were debridement of circumferential callus and nonviable subcutaneous tissue as was the wound over the first metatarsal head. His surgical incision on the right also had some light surface debridement done. 03/23/2016 -- the patient was doing really well and most of his wounds had almost completely healed but now he came back today with a history of having a discharge from the area of his right foot on the plantar aspect and also between his first and second toe. He also has had some discharge from the  left foot. Addendum: I spoke to the PA [name] at the dialysis center whose fax number is [phone]. We discussed the infection the patient has and she will put the patient on vancomycin and Fortaz until the final culture report is back. We will fax this as soon as available. 03/30/2016 -- left foot x-ray IMPRESSION:No definitive osteomyelitis noted. X-ray of the right foot -- IMPRESSION: 1. Soft tissue swelling. Prior amputation right fifth digit. No acute or focal bony abnormality identified. If osteomyelitis remains a clinical concern, MRI can be obtained. 2. Peripheral vascular disease. His culture reports have grown an MSSA -- and we will fax this report to his hemodialysis center. He was called in a prescription of oral doxycycline but I have told her not to fill this in as he is already on IV antibiotics. 04/06/2016 -- a few days ago, I spoke to the hemodialysis center nurse who had stopped the IV antibiotics and he was given a prescription for doxycycline 100 mg by mouth twice a day for a week and he is on this at the present time. 05/11/2016 -- he has recently seen his PCP this week and his hemoglobin A1c  was 7. He is working on his paperwork to get his orthotic shoes. 05/25/2016 -- -- x-ray of the left foot IMPRESSION:No acute bony abnormality. No radiographic changes of acute osteomyelitis. No change since prior study. X-ray of the right foot -- IMPRESSION: Postsurgical changes are seen involving the fifth toe. No evidence of acute osteomyelitis. he developed a large blister on the medial part of his right foot and this opened out and drain fluid. 06/01/2016 -- he has his MRI to be done this afternoon. 06/15/2016 -- MRI of the left forefoot without contrast shows 2 separate regions of cutaneous and subcutaneous edema and possible ulceration and blistering along the ball of the foot. No obvious osteomyelitis identified. MRI of the right foot showed cutaneous and subcutaneous thickening plantar to the first digit sesamoid with an ulcer crater but no underlying osteomyelitis is identified. 06/22/2016 -- the right foot plantar ulcer has been draining a lot of seropurulent material for the last few days. 06/30/2016 -- spoke to the dialysis center and I believe I spoke to [place] a PA at the center who discussed with me and agreed to putting Legrand Como on vancomycin until his cultures arrive. On review of his culture report no WBCs were seen or no organisms were seen and the culture was reincubated for better growth. The final report is back and there were no predominant growth including Streptococcus or Staphylococcus. Clinically though he has a lot of drainage from both wounds a lot of undermining and there is further blebs on the left foot towards the interspace between his first and second toe. 08/10/2016 -- the culture from the right foot showed normal skin flora and there was no Staphylococcus aureus was group A streptococcus isolated. 09/21/2016 -- -- MRI of the right foot was done on 09/13/2016 - IMPRESSION: Findings most consistent with acute osteomyelitis throughout the great toe, sesamoid  bones and plantar aspect of the head of the first metatarsal. Fluid in the sheath of the flexor tendon of the great toe could be sympathetic but is worrisome for septic tenosynovitis. First MTP joint effusion worrisome for septic joint. He was admitted to the hospital on 09/11/2016 and treated for a fever with vancomycin and cefepime. He was seen by Dr. Bobby Rumpf of infectious disease who recommended 6 weeks treatment with vancomycin and ceftazidime with  his hemodialysis and to continue to see as in the wound clinic. Vascular consult was pending. The patient was discharged home on 09/14/2016 and he would continue with IV antibiotics for 6 weeks. 09/28/16 wound appears reasonably healthy. Continuing with total contact cast 10/05/16 wound is smaller and looking healthy. Continue with total contact cast. He continues on IV antibiotics 10/12/2016 -- he has developed a new wound on the dorsal aspect of his left big toe and this is a superficial injury with no surrounding cellulitis. He has completed 30 days of IV antibiotics and is now ready to start his hyperbaric oxygen therapy as per his insurance company's recommendation. 10/19/2016 -- after the cast was removed on the right side he has got good resolution of his ulceration on the right plantar foot. he has a new wound on the left plantar foot in the region of his fourth metatarsal and this will need sharp debridement. 10/26/2016 -- Xray of the right foot complete: IMPRESSION: Changes consistent with osteomyelitis involving the head of the first metatarsal and base of the first proximal phalanx. The sesamoid bones are also likely involved given their positioning. 11/02/2016 -- there still awaiting insurance clearance for his hyperbaric oxygen therapy and hopefully he will begin treatment soon. 11/09/2016 -- started with hyperbaric oxygen therapy and had some barotrauma to the right ear and this was seen by ENT who was prescribed Afrin  drops and would probably continue with HBO and he is scheduled for myringotomy tubes on Friday 11/16/2016 -- he had his myringotomy tubes placed on Friday and has been doing much better after that with some fluid draining out after hyperbaric treatment today. The pain was much better. 11/30/2016 -- over the last 2 days he noticed a swelling and change of color of his right second toe and this had been draining minimal fluid. 12/07/2016 -- x-ray of the right foot -- IMPRESSION:1. Progressive ulceration at the distal aspect of the second digit with significant soft tissue swelling and osseous changes in the distal phalanx compatible with osteomyelitis. 2. Chronic osteomyelitis at the first MTP joint. 3. Ulcerations at the second and third toes as well without definite osseous changes. Osteomyelitis is not excluded. 4. Fifth digit amputation. On 12/01/2016 oo I spoke to Dr. Bobby Rumpf, the infectious disease specialist, who kindly agreed to treat this with IV antibiotics and he would call in the order to the dialysis center and this has been discussed in detail with the patient who will make the appropriate arrangements. The patient will also book an appointment as soon as possible to see Dr. [name] in the office. Of note the patient has not been on antibiotics this entire week as the dialysis center did not receive any orders from Dr. [name]. I got in touch with Dr. [name] who tells me the patient has an appointment to see him this coming Wednesday. 12/14/2016 -- the patient is on ceftazidime and vancomycin during his dialysis, and I understand this was put on by his nephrologist Dr. Raliegh Ip possibly after speaking with infectious disease Dr. [name] 12/23/16; patient was on my schedule today for a wound evaluation as he had difficulties with his schedule earlier this week. I note that he is on vancomycin and ceftazidine at dialysis. In spite of this he arrives today with a new wound on the  base of the right second toe this easily probes to bone. The known wound at the tip of the second toe With this area. He also has a superficial area on  the medial aspect of the third toe on the side of the DIP. This does not appear to have much depth. The area on the plantar left foot is a deep area but did not probe the bone 12/28/16 -- he has brought in some lab work and the most recent labs done showed a hemoglobin of 12.1 hematocrit of 36.3, neutrophils of 51% WBC count of 5.6, BN of 53, albumin of 4.1 globulin of 3.7, vancomycin 13 g per mL. 01/04/2017 -- he saw Dr. [name] who has recommended a amputation of the right second toe and he is awaiting this date. He sees Dr. Bobby Rumpf of infectious disease tomorrow. The bone culture taken on 12/28/2016 had no growth in 2 days. 01/11/2017 -- was seen by Dr. Bobby Rumpf regarding the management and has recommended a eval by vascular surgeons. He recommended to continue the antibiotics during his hemodialysis. Xray of the left foot -- IMPRESSION: No acute fracture, dislocation, or osseous erosion identified. 01/18/2017 -- he has his vascular workup later today and he is going to have his right foot second toe amputation this coming Friday by Dr. [name]. We have also put in for a another 30 treatments with hyperbaric oxygen therapy 01/25/2017 -- I have reviewed Dr. Donnetta Hutching is vascular report from last week where he reviewed him and thought his vascular function was good enough to heal his amputation site and no further tests were recommended. He also had his orthopedic related to surgery which is still pending the notes and we will review these next week. 02/01/2017 -- the operative remote of Dr. 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 02/08/2017 --he was seen by Dr. [name] on 02/03/2017 -- patient is on [place]. after a thorough review he had recommended to continue with antibiotics during hemodialysis. The patient was seen by Dr. [name], but I do not find any follow-up note on the electronic medical record. he has removed the dressing over the right foot to remove the sutures and asked him to see him back in a month's time 02/22/2017 -- patient has been febrile and has been having symptoms of the upper respiratory tract infection but has not been checked for the flu and it's been over 4 days now. He says he is feeling better today. Some drainage between his left first and second toe and this needed to be looked at 03/08/2017 -- was seen by Dr. Bobby Rumpf on 03/07/2017 after review he will stop his antibiotics and see him back in a month to see if he is feeling well. 03/15/2017 -- he has completed his course of hyperbaric oxygen therapy and is doing fine with his health otherwise. 03/22/2017 -- his nutritionist at the dialysis center has recommended a protein supplement to help build his collagen and we will prescribe this for him when he has the details 05/03/2017 -- he recently noticed the area on the plantar aspect of his fifth metatarsal head which opened out and had minimal drainage. 05/10/2017 -- -- x-ray of the left foot -- IMPRESSION:1. No convincing conventional radiographic evidence of active osteomyelitis.2. Active soft tissue ulceration at the tip of the fifth digit, and at the lateral aspect  of the foot adjacent the base of the fifth metatarsal. 3. Surgical changes of prior fourth toe amputation. 4. 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 5. Small vessel atherosclerotic  vascular calcifications. 6. 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 ======= Readmission after 5 weeks: 06/15/2017 -- the patient returns after 5 weeks having had an MRI done on 05/17/2017 MRI of the left foot without contrast showed soft tissue ulcer overlying the base of the fifth metatarsal. 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 No drainable fluid collection to suggest an abscess. He was in hospital between 05/16/2017 and 05/25/2017 -- and on discharge was asked to follow-up with Dr. [name] and Dr. [name]. He was treated 2 weeks post discharge with vancomycin plus ceftriaxone with hemodialysis and oral metronidazole 500 mg 3 times a day. 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 last hemoglobin A1c was 6.7% He had a postoperative MR of the foot on 05/23/2017 -- which showed no new areas of cortical bone loss, edema or soft tissue ulceration to suggest osteomyelitis. the patient has completed his course of IV antibiotics during dialysis and is to see the infectious disease doctor tomorrow. Dr. [name] had seen him after suture removal and asked him to keep the wound with a dry dressing 06/21/2017 -- he was seen by Dr. Bobby Rumpf of infectious disease on 06/16/2017 -- he stopped his Flagyl and will see him back in 6 weeks. He was asked to follow-up with Dr. [name] and with the wound clinic clinic. 07/05/17; bone biopsy from last time showed acute osteomyelitis. This is from the reminiscent in the left fifth metatarsal. Culture result is apparently still pending [holding for anaerobe}. From my understanding in this case this is been a progressive necrotic wound which is deteriorated markedly over the last 3 weeks since he returned here. He now has a large area of exposed bone which was biopsied and cultured last week. Dr. Graylon Good has put him on vancomycin  and Fortaz during his hemodialysis and Flagyl orally. He is to see Dr. [name] next week 07/19/2017 -- the patient was reviewed by Dr. [name] of orthopedics who reviewed the case in detail and agreed with the plan to continue with IV antibiotics, aggressive wound care and hyperbaric oxygen therapy. He would see him back in 3 weeks' time 08/09/2017 -- saw Dr. Bobby Rumpf on 08/03/2017 -he was restarted on his antibiotics for 6 weeks which included vanco, ceftaz and Flagyl. he recommended continuing his antibiotics for 6 weeks and reevaluate his completion date. He would continue with wound care and hyperbaric oxygen therapy. 08/16/2017 -- I understand he will be completing his 6 weeks of antibiotics sometime later this week. 09/19/2017 -- he has been without his wound VAC for the last week due to lack of supplies. He has been packing his wound with silver alginate. 10/04/2017 -- he has an appointment with Dr. [name] tomorrow and hence we will not apply the wound VAC after his dressing changes today. 10/11/2017 -- he was seen by Dr. Bobby Rumpf on 10/05/2017, noted that the patient was currently off antibiotics, and after thorough review he recommended he follow-up with the wound care as per plan and no further antibiotics at this point. 11/29/17; patient is arrived for the wound on his left lateral foot.Marland Kitchen He is completed IV antibiotics and 2 rounds  of hyperbaric oxygen for treatment of underlying osteomyelitis. He arrives today with a surface on most of the wound area however our intake nurse noted drainage from the superior aspect. This was brought to my attention. 12/13/2017 -- the right plantar foot had a large bleb and once the bleb was opened out a large callus and subcutaneous debris was removed and he has a plantar ulcer near the fourth metatarsal head 12/28/17 on evaluation today patient appears to be doing acutely worse in regard to his left foot. The wound which has been appearing  to do better as now open up more deeply there is bone palpable at the base of the wound unfortunately. He tells me that this feels "like it did when he had osteomyelitis previously" he also noted that his second toe on the left foot appears to be doing worse and is swollen there does appear to be some fluid collected underneath. His right foot plantar ulcer appears to be doing somewhat better at this point and there really is no complication at the site currently. No fevers, chills, nausea, or vomiting noted at this time. Patient states that he normally has no pain at this site however. Today he is having significant pain. 01/03/18; culture done last week showed methicillin sensitive staph aureus and group B strep. He was on Septra however he arrives today with a fever of 101. He has a functional dialysis shunt in his left arm but has had no pain here. No cough. He still makes some urine no dysuria he did have abdominal pain nausea and diarrhea over the weekend but is not had any diarrhea since yesterday. Otherwise he has no specific complaints 01/05/18; the patient returns today in follow-up for his presentation from 1/8. At that point he was febrile. I gave him some Levaquin adjusted for his dialysis status. He tells me the fever broke that night and it is not likely that this was actually a wound infection. He is gone on to have an MRI of the left foot; this showed interval development of an abnormal signal from the base of the third and fourth metatarsal and in the cuboid reminiscent consistent with osteomyelitis. There is no mention of the left second toe I think which was a concern when it was ordered. The patient is taking his Levaquin 01/10/18; the patient has completed his antibiotics today. The area on the left lateral foot is smaller but it still probes easily to bone. He has underlying osteomyelitis here and sees Dr. Megan Salon of infectious disease this week ooThe area on the right third  plantar metatarsal head still requires debridement not much change in dimensions [facility] has a new wound on the medial tip of his right third toe again this probes to bone. He only noticed this 2 days ago 01/17/18; the patient saw Dr. [name] last week and he is back on Vanco and Fortaz at dialysis. 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 We are previously seeing him for a wound also on the base of the fourth med head on the right. X- ray of this area did not show osteomyelitis in relation to a new wound on the tip of his right third toe. Again this probes to bone. Finally his second toe on the left which didn't really show anything on the MRI at least no report appears to have a separated cutaneous area  which I think is going to come right off the tip of his toe and leave exposed bone. Whether this is infectious or ischemic I am not clear Culture I did from the third toe last week which was his new wound was negative. Plain x-ray on the right foot did not show osteomyelitis in the toes 01/24/18; the patient is going went to see Dr. [name]. He is going to have a amputation of the left second and right third toe although paradoxically the left second toe looks better than last week. We have treating a deep probing wound on the left lateral foot and the area over the third metatarsal head on the right foot. 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 Also a debridement including bone of the left lateral foot and a closure which is still sutured. This is a bit surprising. Also apparent debridement of the base of the right third toe wound. Bit difficult to tell what he is been doing but I think it is Xeroform to the amputation sites and the left lateral foot and silver alginate to the right plantar foot 02/09/18; on Rocephin at dialysis for osteomyelitis in the  left lateral foot. I'm not sure exactly where we are in the frame of things here. The wound on the left lateral foot is still sutured also the amputation sites of the left second and right third toe. He is been using Xeroform to the sutured areas silver alginate to the right foot 02/14/18; he continues on Rocephin at dialysis for osteomyelitis. I still don't have a good sense of where we are in the treatment duration. The wounds on the left lateral foot had the sutures removed and this clearly still probes deeply. We debrided the area with a #5 curet. We'll use silver alginate to the wound oothe area over the right third met head also required debridement of callus skin and subcutaneous tissue and necrotic debris over the wound surface. This tunnels superiorly but I did not unroofed this today. ooThe areas on the tip of his toes have a crusted surface eschar I did not debridement this either. 02/21/18; he continues on Rocephin at dialysis for osteomyelitis. I don't have a good sense of where we are and the treatment duration. The wounds on the left lateral foot is callused over on presentation but requires debridement. The area on the right third med head plantar aspect actually is measuring smaller 02/28/18;patient is on Vanco and Fortaz at dialysis, I'm not sure why I thought this was Rocephin above unless it is been changed. I don't have a good sense of time frame here. Using silver collagen to the area on the left lateral foot and right third med head plantar foot 03/07/18; patient is completing bank and Fortaz at dialysis soon. He is using Silver collagen to the area on the left lateral foot and the right third metatarsal head. He has a smaller superficial wound distal to the left lateral foot wound. 03/14/18-he is here in follow-up evaluation for multiple ulcerations to his bilateral feet. He presents with a new ulceration to the plantar aspect of the left foot underneath the blister, this was  deroofed to reveal a partial thickness ulcer. He is voicing no complaints or concerns, tolerated dialysis yesterday. We will continue with same treatment plan and he will follow-up next week 03/21/18; the patient has a area on the lateral left foot which still has a small probing area. The overall surface area of the wound  is better. He presented with a new ulceration on the left foot plantar fifth metatarsal head last week. The area on the right third metatarsal head appears smaller.using silver alginate to all wound areas. The patient is changing the dressing himself. He is using a Darco forefoot offloading are on the right and a healing sandal on the left 03/28/18; original wound left lateral foot. Still nowhere close to looking like a heeling surface oosecondary wound on the left lateral foot at the base of his question fifth metatarsal which was new last week ooRight third metatarsal head. ooWe have been using silver alginate all wounds 04/04/18; the original wound on the left lateral foot again heavy callus surrounding thick nonviable subcutaneous tissue all requiring debridement. The new wound from 2 weeks ago just near this at the base of the fifth metatarsal head still looks about the same ooUnfortunately there is been deterioration in the third medical head wound on the right which now probes to bone. I must say this was a superficial wound at one point in time and I really don't have a good frame of reference year. I'll have to go back to look through records about what we know about the right foot. 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 He is also been to see Dr. [name]. Somewhere in here as somebody is ordered a VASCULAR evaluation which was done on 03/22/18. On the right is anterior tibial artery was monophasic triphasic at the posterior tibial artery. On the left anterior tibial  artery monophasic posterior tibial artery monophasic. ABI in the right was 1.43 on the left 1.21. TBIs on the right at 0.41 and on the left at 0.32. A vascular consult was recommended and I think has been arranged. 04/11/18; [name] has not had an MRI of the right foot since 2017. Recent x-ray of the right foot done in January and February was negative. However he has had a major deterioration in the wound over the third met head. He is completed antibiotics last week at dialysis [place]. I think he is going to see vascular in follow-up. The areas on the left lateral foot and left plantar fifth metatarsal head both look satisfactory. I debrided both of these areas although the tissue here looks good. The area on the left lateral foot once probe to bone it certainly does not do that now 04/18/18; MRI of the right foot is on Thursday. He has what looks to be serosanguineous purulent drainage coming out of the wound over the right third metatarsal head today. He completed antibiotics bank and Fortaz 2 weeks ago at dialysis. He has a vascular follow-up with regards to his arterial insufficiency although I don't exactly see when that is booked. The areas on the left foot including the lateral left foot and the plantar left fifth metatarsal head look about the same. 04/25/18-He is here in follow-up evaluation for bilateral foot ulcers. MRI obtained was negative for osteomyelitis. Wound culture was negative. We will continue with same treatment plan he'll follow-up next week 05/02/18; the right plantar foot wound over the third metatarsal head actually looks better than when I last saw this. His MRI was negative for osteomyelitis wound culture was negative. ooOn the left plantar foot both wounds on the plantar fifth metatarsal head and on the lateral foot both are covered in a very hard circumferential callus. 05/09/18; right plantar foot wound over  the third metatarsal head stable from last  week. ooLeft plantar foot wound over the fifth metatarsal head also stable but with callus around both wound areas ooThe area on the left lateral foot had thick callus over a surface and I had some thoughts about leaving this intact however it felt boggy. ooWe've been using silver alginate all wounds 05/16/18; since the patient was last here he was hospitalized from 5/15 through 5/19. 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 This grew MRSA. He was placed on vancomycin.MRI of the left foot suggested "progressive" osteomyelitis and bone destruction of the cuboid and the base of the fifth metatarsal. Stable erosive changes of the base of the second metatarsal. I'm wondering if they're aware that he had surgery and debridement in the area of the underlying bone previously by Dr. [name]. In any case, He was also revascularized with an angioplasty and stenting of the left tibial peroneal trunk and angioplasty of the left posterior tibial artery. He is on vancomycin at dialysis. He has a 2 week follow-up with infectious disease. He has vascular surgery follow-up. He was started on Plavix. 05/23/18; the patient's wound on the lateral left foot at the level of the fifth metatarsal head and the plantar wound on the plantar fifth metatarsal head both look better. The area on the right third metatarsal head still has depth and undermining. We've been using silver alginate. He is on vancomycin. He has been revascularized on the left 05/30/18; he continues on vancomycin at dialysis. Revascularized on the left. The area on the left lateral foot is just about closed. Unfortunately the area over the plantar fifth metatarsal head undermining superiorly as does the area over the right third metatarsal head. Both of these significantly deteriorated from last week. He has appointments next week with infectious  disease and orthopedics I delayed putting on a cast on the left until those appointments which therefore we made we'll bring him in on Friday the 14th with the idea of a cast on the left foot 06/06/18; he continues on vancomycin at dialysis. Revascularized on the left. He arrives today with a ballotable swelling just above the area on the left lateral foot where his previous wound was. He has no pain but he is reasonably insensate. The difficult areas on the plantar left fifth metatarsal head looks stable whereas the area on the right third plantar metatarsal head about the same as last week there is undermining here although I did not unroofed this today. He required a considerable debridement of the swelling on the left lateral foot area and I unroofed an abscess with a copious amount of brown purulent material which I obtained for culture. I don't believe during his recent hospitalization he had any further imaging although I need to review this. Previous cultures from this area done in this clinic showed MRSA, I would be surprised if this is not what this is currently even though he is on vancomycin. 06/13/18; he continues on vancomycin at dialysis however he finishes this on Sunday and then graduates the doxycycline previously prescribed by Dr. [name]. The abscess site on the lateral foot that I unroofed last time grew moderate amounts of methicillin-resistant staph aureus that is both vancomycin and tetracycline sensitive so we should be okay from that regard. He arrives today in clinic with a connection between the abscess site and the area on the  lateral foot. We've been using silver alginate to all his wound areas. The right third metatarsal head wound is measuring smaller. Using silver alginate on both wound areas 06/20/18; transitioning to doxycycline prescribed by Dr. [name]. The abscess site on the lateral foot that I unroofed 2 weeks ago grew MRSA. That area has largely closed down  although the lateral part of his wound on the fifth metatarsal head still probes to bone. I suspect all of this was connected. ooOn the right third metatarsal head smaller looking wound but surrounded by nonviable tissue that once again requires debridement using silver alginate on both areas 06/27/18; on doxycycline 100 twice a day. The abscess on the lateral foot has closed down. Although he still has the wound on the left fifth metatarsal head that extends towards the lateral part of the foot. The most lateral part of this wound probes to bone ooRight third metatarsal head still a deep probing wound with undermining. Nevertheless I elected not to debridement this this week ooIf everything is copious static next week on the left I'm going to attempt a total contact cast 07/04/18; he is tolerating his doxycycline. The abscess on the left lateral foot is closed down although there is still a deep wound here that probes to bone. We will use silver collagen under the total contact cast Silver collagen to the deep wound over the right third metatarsal head is well 07/11/18; he is tolerating doxycycline. The left lateral fifth plantar metatarsal head still probes deeply but I could not probe any bone. We've been using silver collagen and this will be the second week under the total contact cast ooSilver collagen to deep wound over the right third metatarsal head as well 07/18/18; he is still taking doxycycline. The left lateral fifth plantar metatarsal this week probes deeply with a large amount of exposed bone. Quite a deterioration. He required extensive debridement. Specimens of bone for pathology and CNS obtained ooAlso considerable debridement on the right third metatarsal head 07/25/18; bone for pathology last week from the lateral left foot showed osteomyelitis. CandS of the bone Enterobacter. And Klebsiella. He is now on ciprofloxacin and the doxycycline is stopped [Dr. [name] of infectious  disease] area He has an appointment with Dr. [name] tomorrow I'm going to leave the total contact cast off for this week. May wish to try to reapply that next week or the week after depending on the wound bed looks. I'm not sure if there is an operative option here, previously is followed with Dr. [name] 08/01/18 on evaluation today patient presents for reevaluation. He has been seen by infectious disease, Dr. [name], and he has placed the patient back on Ceftaz currently to be given to him at dialysis three times a week. 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 Fortunately there does not appear to be the evidence of infection at this point in time. Overall the patient has been tolerating the dressing changes without complication. We are no longer due lives in the cast of left foot secondary to the infection obviously. 08/10/18 on evaluation today patient appears to be doing rather well all things considered in regard to his left plantar foot ulcer. He does have a significant callous on the lateral portion of his left foot he wonders if I can help clean that away to some degree today. Fortunately he is having no evidence of infection at this time which is good news.  No fevers chills noted. With that being said his right plantar foot actually does have a significant callous buildup around the wound opening this seems to not be doing as well as I would like at this point. I do believe that he would benefit from sharp debridement at this site. Subsequently I think a total contact cast would be helpful for the right foot as well. 08/17/18; the patient arrived today with a total contact cast on the right foot. This actually looks quite good. He had gone to see Dr. Megan Salon of infectious disease about the osteomyelitis on the left foot. I think he is on IV Fortaz at dialysis although I am not exactly sure of the rationale for the  Fortaz at the time of this dictation. He also arrived in today with a swelling on the lateral left foot this is the site of his original wounds in fact when I first saw this man when he was under Dr. Ardeen Garland care I think this was the site of where the wound was located. It was not particularly tender however using a small scalpel I opened it to [place] some moderate amount of purulent drainage. 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 Last MRI was in May that showed progressive osteomyelitis and bone destruction of the cuboid and base of the fifth metatarsal. Last week he had an abscess over this same area this was removed. Culture was negative. ooWe are continuing with a total contact cast to the area on the third metatarsal head on the right and making some good progress here. The patient asked me again about renal transplant. He is not on the list because of the open wounds. 08/31/18; apparently the patient had an interruption in the [place] but he is now back on this at dialysis. Apparently there was a misunderstanding and Dr. Crissie Figures orders. Due to have the MRI of the left foot tonight. ooThe area on the right foot continues to have callus thick skin and subcutaneous tissue around the wound edge that requires constant debridement however the wound is smaller we are using a total contact cast in this area. Alginate all wounds 09/07/18; without much surprise the MRI of his left foot showed osteomyelitis in the reminiscent of his fourth metatarsal but also the third metatarsal. She arrives in clinic today with the right foot and a total contact cast. There was purulent drainage coming out of the wound which I have cultured. Marked deterioration here with undermining widely around the wound orifice. Using pickups and a scalpel I remove callus and subcutaneous tissue from  a substantial new opening. ooIn a similar fashion the area on the left lateral foot that was blistered last week I have opened this and remove skin and subcutaneous tissue from this area to expose a obvious new wound. I think there is extension and communication between all of this on the left foot. ooThe patient is on Fortaz at dialysis. Culture done of the right foot. We are clearly not making progress here. ooWe have made him an appointment with Dr. [name] of orthopedics. I think an amputation of the left leg may be discussed. I don't think there is anything that can be done with foot salvage. 09/14/18; culture from the right foot last time showed a very resistant MRSA. This is resistant to doxycycline. I'm going to try to get linezolid at least for  a week. He does not have a appointment with Dr. [name] yet. 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 I'll send a message to Dr. [name] of infectious disease. I want the patient to see Dr. [name] to go over the pros and cons of an amputation which I think will be a BKA. The patient had a question about whether this is curative or not. I told him that I thought it would be although spread of staph aureus infection is not unheard of. MRI of the right foot was done at the end of April 2019 did not show osteomyelitis. The patient last saw Dr. [name] on 9/3. I'll send Dr. [name] a message. I would really like him to weigh the pros and cons of a BKA on the left otherwise he'll probably need IV antibiotics and perhaps hyperbaric oxygen again. He has not had a good response to this in the past. His MRI earlier this month showed progressive damage in the remnants of the fourth and third metatarsal 09/21/2018; sees Dr. [name] of orthopedics next week to discuss a left BKA in response to the osteomyelitis in the left foot. Using silver alginate to all wound areas. He is completing the Zyvox I did  put in for him after last culture showed MRSA 09/29/2018; patient saw Dr. [name] of orthopedics discussed the osteomyelitis in the left foot third and fourth metatarsals. He did not recommend urgent surgery but certainly stated the only surgical option would be a BKA. He is communicating with Dr. [name]. The problem here is the instability of the areas on his foot which constantly generate draining abscesses. I will communicate with Dr. [name] about this. He has an appointment on 10/23 10/05/2018; patient sees Dr. [name] on 10/23. I have sent him a secure message not ordered any additional antibiotics for now. Patient continues to have 2 open areas on the left plantar foot at the fifth metatarsal head and the lateral aspect of the foot. These have not changed all that much. The area on the right third met head still has thick callus and surrounding subcutaneous tissue we have been using silver alginate 10/12/2018; patient sees Dr. [name] or colleague on 10/23. 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 The area on the right plantar foot is just about closed. The superior wound on the left fifth metatarsal head is callused over but I am not sure if this is closed. The area below it is about the same. Considerable amount of callus on the lateral foot. We have been using silver alginate to the wounds 10/19/2018; the patient saw Dr. [name] on 10/22. He wishes to try and continue to save the left foot. He has been given 6 weeks of oral ciprofloxacin. We continue to put a total contact cast on the right foot. The area over the fifth metatarsal head on the left is callused over/perhaps healed but I did not remove the callus to find out. He still has the wound on the left lateral midfoot requiring debridement. We have been using silver alginate to all wound areas 10/26/2018 ooOn the left foot our intake nurse noted some purulent  drainage from the inferior wound [currently the only one that is not callused over] ooOn the right foot even though he is in total contact cast a considerable amount of thick black callus and surface eschar. On this side we have  been using silver alginate under a total contact cast. oohe remains on ciprofloxacin as prescribed by Dr. [name] 11/02/2018; culture from last week which is done of the probing area on the left midfoot wound showed MRSA "few". I am going to need to contact Dr. [name] which I will do today I think he is going to need IV antibiotics again. The area on the left foot which was callused on the side is clearly separating today all of this was removed a copious amounts of callus and necrotic subcutaneous tissue. ooThe area on the right plantar foot actually remained healthy looking with a healthy granulated base. ooWe are using silver alginate to all wound areas 11/09/2018; unfortunately neither 1 of the patient's wounds areas looks at all satisfactory. On the left he has considerable necrotic debris from the plantar wound laterally over the foot. I removed copious amounts of material including callus skin and subcutaneous tissue. On the right foot he arrives with undermining and frankly purulent drainage. Specimen obtained for culture and debridement of the callused skin and subcutaneous tissue from around the circumference. Because of this I cannot put him back in a total contact cast but to be truthful we are really unfortunately not making a lot of progress I did put in a secure text message to Dr. [name] wondering about the MRSA on the left foot. He suggested vancomycin although this is not started. I was left wondering if he expected me to send this into dialysis. Has we have more purulence on the right side wait for that result before calling dialysis 11/16/2018; I called dialysis earlier this week to get the vancomycin started. I think he got the first dose  on Tuesday. The patient tells me that he fell earlier this week twisting his left foot and ankle and he is swelling. He continues to not look well. He had blood cultures done at dialysis on Tuesday he is not been informed of the results. 12/07/2018; the patient was admitted to hospital from 11/22/2018 through 12/02/2018. He underwent a left BKA. Apparently had staph sepsis. In the meantime being off the right foot this is closed over. He follows up with Dr. 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 Subsequent to this he actually had multiple falls in fact he tells me to following which calls the wound to be his apparently. He has been seeing Dr. [name] and his physician assistant in the interim. They actually did want him to come back for reevaluation to see if there's anything we can do for me wound care perspective the help this area to heal more appropriately. The patient states that he does not have a tremendous amount of pain which is good news. No fevers, chills, nausea, or vomiting noted at this time. We have gotten approval from Dr. [name] office had [place] for [place] to treat the patient for his stop wound. This is due to the fact that the patient was in the 90 day postop global. 1/21; the patient does not have an open wound on the right foot although he does have some pressure areas that will need to be padded when he is transferring. The dehisced surgical wound looks clean although there is some undermining of 1.5 cm superiorly. 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 This appears healthy but there is undermining. TheraSkin #  1 2/11; TheraSkin #2 2/25; TheraSkin #3. Wound is measuring smaller 3/10; TheraSkin #4 wound is measuring smaller 3/24; TheraSkin #5 wound is measuring smaller and  looks healthy. 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 He has no open area on the right foot although he does have some callused area over the original third metatarsal head wound. The third metatarsal is also subluxed on the right foot. I have warned him today that he cannot consider wearing a prosthesis for at least a month but he can go for measurements. He is going to need to keep the right foot padded is much as possible in his diabetic shoe indefinitely READMISSION 06/12/2019 [name] is a type II diabetic on dialysis. He has been in this clinic multiple times with wounds on his bilateral lower extremities. 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 He also has a history of wounds on his right foot but tells me that everything is going well here. He actually came in here with his prosthesis walking with a cane. We were all really quite gratified to see this He states over the last several weeks he has had a painful area at the tip of the left third finger. His dialysis shunt in his is in the left upper arm and this particularly hurts during dialysis when his fingers get numb and there is a lot of pain in the tip of his left third finger. I believe he is seen vascular surgery. He had a Doppler done to evaluate for a dialysis steal syndrome. He is going for a banding procedure 1 week from today towards the left AV fistula. I think if that is unsuccessful they will be looking at creating a new shunt. He had a course of doxycycline when he which he finished about a week ago. He has been using Bactroban to the finger 6/25; the patient had his banding procedure and things seem to be going better. He is not having pain in his hand at dialysis. The area on the tip of his finger seems about closed. He did however have a paronychia I the last time I saw him. I  have been advising him to use topical Bactroban washing the finger. Culture I did last time showed a few staph epidermidis which I was willing to dismiss as superficial skin contaminant. He arrives today with the finger wound looking better however the paronychia a seems worse 7/9; 2-week follow-up. He does not have an open wound on the tip of his third left finger. I thought he had an initial ischemic wound on the tip of the finger as well as a paronychia a medially. All of this appears to be closed. 8/6-Patient returns after 3 weeks for follow-up and has a right second met head plantar callus with a significant area of maceration hiding an ulcer ABI repeated today on right - 1.26 8/13-Patient returns at 1 week, the right second met head plantar wound appears to be slightly better, it is surrounded by the callus area we are using silver alginate 8/20; the patient came back to clinic 2 weeks ago with a wound on the right second metatarsal head. He apparently told me he developed callus in this area but also went away from his custom shoes to a pair of running shoes. Using silver alginate. He of course has the  left BKA and prosthesis on the left side. There might be much we can do to offload this although the patient tells me he has a wheelchair that he is using to try and stay off the wound is much as possible 8/27; right second metatarsal head. Still small punched out area with thick callus and subcutaneous tissue around the wound. We have been using silver collagen. This is always been an issue with this man's wound especially on this foot 9/3; right second met head. Still the same punched-out area with thick callus and subcutaneous tissue around the wound removing the circumference demonstrates repetitively undermining area. I have removed all the subcutaneous tissue associated with this. We have been using silver collagen 9/15; right second metatarsal head. Same punched-out thick callus and  subcutaneous tissue around the wound area. Still requiring debridement we have been using silver collagen I changed back to silver alginate X-ray last time showed no evidence of osteomyelitis. Culture showed Klebsiella and he was started and Keflex apparently just 4 days ago 9/24; right second metatarsal head. He is completed the antibiotics I gave him for 10 days. Same punched-out thick callus and subcutaneous tissue around the area. Still requiring debridement. We have been using silver alginate. The area itself looks swollen and this could be just [place] that comes on this area with walking on a prosthesis on the left however I think he needs an MRI and I am going to order that today. There is not an option to offload this further 10/1; right second plantar metatarsal head. MRI booked for October 6. Generally looking better today using silver alginate Objective Constitutional Sitting or standing Blood Pressure is within target range for patient.. Pulse regular and within target range for patient.Marland Kitchen Respirations regular, non-labored and within target range.. Temperature is normal and within the target range for the patient.Marland Kitchen Appears in no distress. Vitals Time Taken: 3:44 PM, Height: 73 in, Weight: 202 lbs, BMI: 26.6, Temperature: 98.2 F, Pulse: 86 bpm, Respiratory Rate: 17 breaths/min, Blood Pressure: 108/71 mmHg, Capillary Blood Glucose: 134 mg/dl. General Notes: last CBG was 134 on 09/26/2019 per patient General Notes: Wound exam; better today. Less callus. Still requiring debridement with a #3 curette of callus subcutaneous tissue and debris over the wound surface. Hemostasis with direct pressure. There is no undermining or tunneling and in general things look somewaht Integumentary (Hair, Skin) Wound #41 status is Open. Original cause of wound was Gradually Appeared. The wound is located on the Right Metatarsal head second. The wound measures 1cm length x 1.2cm width x 0.7cm depth;  0.942cm^2 area and 0.66cm^3 volume. There is Fat Layer (Subcutaneous Tissue) Exposed exposed. There is no tunneling or undermining noted. There is a medium amount of serosanguineous drainage noted. The wound margin is well defined and not attached to the wound base. There is small (1-33%) pink, pale granulation within the wound bed. There is a large (67-100%) amount of necrotic tissue within the wound bed including Adherent Slough. 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 There was a Excisional Skin/Subcutaneous Tissue Debridement with a total area of 1.2 sq cm performed by Ricard Dillon., MD. With the following instrument(s):  Curette to remove Viable and Non-Viable tissue/material. Material removed includes Callus, Subcutaneous Tissue, Skin: Dermis, and Fibrin/Exudate after achieving pain control using Lidocaine 4% Topical Solution. A time out was conducted at 16:05, prior to the start of the procedure. A Minimum amount of bleeding was controlled with Pressure. The procedure was tolerated well with a pain level of 0 throughout and a pain level of 0 following the procedure. Post Debridement Measurements: 1cm length x 1.2cm width x 0.7cm depth; 0.66cm^3 volume. Character of Wound/Ulcer Post Debridement is improved. Severity of Tissue Post Debridement is: Fat layer exposed. Post procedure Diagnosis Wound #41: Same as Pre-Procedure Plan Follow-up Appointments: Return Appointment in 1 week. Dressing Change Frequency: Wound #41 Right Metatarsal head second: Change Dressing every other day. Skin  Barriers/Peri-Wound Care: Barrier cream - as needed for maceration, wetness, or redness. Wound Cleansing: May shower and wash wound with soap and water. Primary Wound Dressing: Wound #41 Right Metatarsal head second: Calcium Alginate with Silver Secondary Dressing: Wound #41 Right Metatarsal head second: Kerlix/Rolled Gauze Dry Gauze Other: - felt to periwound and place one below wound. lightly wrap coban to hold dressing in place. felt / pad wound Edema Control: Avoid standing for long periods of time Elevate legs to the level of the heart or above for 30 minutes daily and/or when sitting, a frequency of: - throughout the day. Off-Loading: Other: - felt to periwound and place one below wound. Additional Orders / Instructions: Other: - minimize walking and standing on foot to aid in wound healing and callus buildup. ****Patient to ensure to remove alginate silver from wound bed, cover with gauzes, before MRI and during. Once complete redress wound with silver alginate.**** 1. I am continuing with the silver alginate 2. MRI in October 6 3. There is not an aggressive option for offloading. I asked him to stay off his foot is much as he can which he understands Electronic Signature(s) Signed: 09/27/2019 6:13:48 PM By: Linton Ham MD Entered By: Linton Ham on 09/27/2019 17:24:33 -------------------------------------------------------------------------------- SuperBill Details Patient Name: Date of Service: [name], [name] 09/27/2019 Medical Record [id number] Patient Account Number: [id number] Date of Birth/Sex: Treating RN: [birth year] (68 y.o. [name], Meta.Reding Primary Care Provider: [name] Other Clinician: Referring Provider: Treating Provider/Extender:Orian Figueira, Jeronimo Greaves, Buena Irish in Treatment: 15 Diagnosis Coding ICD-10 Codes Code Description E11.621 Type 2 diabetes mellitus with foot ulcer L97.512 Non-pressure chronic ulcer of other part of  right foot with fat layer exposed I70.298 Other atherosclerosis of native arteries of extremities, other extremity Facility Procedures The patient participates with Medicare or their insurance follows the Medicare Facility Guidelines: CPT4 Code Description Modifier Quantity [id number] - DEB SUBQ TISSUE 20 SQ CM/< 1 ICD-10 Diagnosis Description E11.621 Type 2 diabetes mellitus with  foot ulcer L97.512 Non-pressure chronic ulcer of other part of right foot with fat layer exposed Physician Procedures CPT4 Code Description: [id number] - WC PHYS SUBQ TISS 20 SQ CM ICD-10 Diagnosis Description E11.621 Type 2 diabetes mellitus with foot ulcer L97.512 Non-pressure chronic ulcer of other part of right foot wi Modifier: th fat layer e Quantity: 1 xposed Electronic Signature(s) Signed: 09/27/2019 6:13:48 PM By: Linton Ham MD Entered By: Linton Ham on 09/27/2019 17:24:49

## 2019-09-28 DIAGNOSIS — N2581 Secondary hyperparathyroidism of renal origin: Secondary | ICD-10-CM | POA: Diagnosis not present

## 2019-09-28 DIAGNOSIS — N186 End stage renal disease: Secondary | ICD-10-CM | POA: Diagnosis not present

## 2019-09-28 DIAGNOSIS — Z992 Dependence on renal dialysis: Secondary | ICD-10-CM | POA: Diagnosis not present

## 2019-10-01 DIAGNOSIS — N2581 Secondary hyperparathyroidism of renal origin: Secondary | ICD-10-CM | POA: Diagnosis not present

## 2019-10-01 DIAGNOSIS — Z992 Dependence on renal dialysis: Secondary | ICD-10-CM | POA: Diagnosis not present

## 2019-10-01 DIAGNOSIS — N186 End stage renal disease: Secondary | ICD-10-CM | POA: Diagnosis not present

## 2019-10-02 ENCOUNTER — Ambulatory Visit (HOSPITAL_COMMUNITY)
Admission: RE | Admit: 2019-10-02 | Discharge: 2019-10-02 | Disposition: A | Discharge status: Home / Self Care | Payer: Medicare Other | Source: Ambulatory Visit | Attending: Internal Medicine | Admitting: Internal Medicine

## 2019-10-02 ENCOUNTER — Other Ambulatory Visit: Payer: Self-pay | Admitting: Internal Medicine

## 2019-10-02 ENCOUNTER — Other Ambulatory Visit: Payer: Self-pay

## 2019-10-02 DIAGNOSIS — E11621 Type 2 diabetes mellitus with foot ulcer: Secondary | ICD-10-CM

## 2019-10-02 DIAGNOSIS — L97519 Non-pressure chronic ulcer of other part of right foot with unspecified severity: Secondary | ICD-10-CM

## 2019-10-03 DIAGNOSIS — E1121 Type 2 diabetes mellitus with diabetic nephropathy: Secondary | ICD-10-CM | POA: Diagnosis not present

## 2019-10-03 DIAGNOSIS — N186 End stage renal disease: Secondary | ICD-10-CM | POA: Diagnosis not present

## 2019-10-03 DIAGNOSIS — Z992 Dependence on renal dialysis: Secondary | ICD-10-CM | POA: Diagnosis not present

## 2019-10-03 DIAGNOSIS — N2581 Secondary hyperparathyroidism of renal origin: Secondary | ICD-10-CM | POA: Diagnosis not present

## 2019-10-04 ENCOUNTER — Encounter (HOSPITAL_BASED_OUTPATIENT_CLINIC_OR_DEPARTMENT_OTHER): Payer: Medicare Other | Admitting: Internal Medicine

## 2019-10-04 ENCOUNTER — Other Ambulatory Visit: Payer: Self-pay

## 2019-10-04 DIAGNOSIS — E11621 Type 2 diabetes mellitus with foot ulcer: Secondary | ICD-10-CM | POA: Diagnosis not present

## 2019-10-04 DIAGNOSIS — I132 Hypertensive heart and chronic kidney disease with heart failure and with stage 5 chronic kidney disease, or end stage renal disease: Secondary | ICD-10-CM | POA: Diagnosis not present

## 2019-10-04 DIAGNOSIS — E1151 Type 2 diabetes mellitus with diabetic peripheral angiopathy without gangrene: Secondary | ICD-10-CM | POA: Diagnosis not present

## 2019-10-04 DIAGNOSIS — I70208 Unspecified atherosclerosis of native arteries of extremities, other extremity: Secondary | ICD-10-CM | POA: Diagnosis not present

## 2019-10-04 DIAGNOSIS — L97512 Non-pressure chronic ulcer of other part of right foot with fat layer exposed: Secondary | ICD-10-CM | POA: Diagnosis not present

## 2019-10-04 DIAGNOSIS — E1122 Type 2 diabetes mellitus with diabetic chronic kidney disease: Secondary | ICD-10-CM | POA: Diagnosis not present

## 2019-10-04 DIAGNOSIS — L97812 Non-pressure chronic ulcer of other part of right lower leg with fat layer exposed: Secondary | ICD-10-CM | POA: Diagnosis not present
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
## 2019-10-04 NOTE — Progress Notes (Addendum)
ADHAM, JOHNSON ([id number]) Visit Report for 10/04/2019 Debridement Details Patient Name: Date of Service: [name], [name] 10/04/2019 1:00 PM Medical Record [name]:[id number] Patient Account Number: [id number] Date of Birth/Sex: [birth year] (68 y.o. M) Treating RN: Deon Pilling Primary Care Provider: [name] Other Clinician: Referring Provider: Treating Provider/Extender:Robson, Birdena Crandall in Treatment: 16 Debridement Performed for Wound #41 Right Metatarsal head second Assessment: Performed By: Physician Ricard Dillon., MD Debridement Type: Debridement Severity of Tissue Pre Limited to breakdown of skin Debridement: Level of Consciousness (Pre- Awake and Alert procedure): Pre-procedure Verification/Time Out Taken: Yes - 13:58 Start Time: 13:59 Pain Control: Lidocaine 4% Topical Solution Total Area Debrided (L x W): 0.2 (cm) x 0.2 (cm) = 0.04 (cm) Tissue and other material Viable, Non-Viable, Callus, Subcutaneous, Skin: Dermis , Fibrin/Exudate debrided: Level: Skin/Subcutaneous Tissue Debridement Description: Excisional Instrument: Curette Bleeding: Minimum Hemostasis Achieved: Pressure End Time: 14:02 Procedural Pain: 0 Post Procedural Pain: 0 Response to Treatment: Procedure was tolerated well Level of Consciousness Awake and Alert (Post-procedure): Post Debridement Measurements of Total Wound Length: (cm) 0.4 Width: (cm) 0.4 Depth: (cm) 0.5 Volume: (cm) 0.063 Character of Wound/Ulcer Post Improved Debridement: Severity of Tissue Post Debridement: Limited to breakdown of skin Post Procedure Diagnosis Same as Pre-procedure Electronic Signature(s) Signed: 10/04/2019 5:49:31 PM By: Linton Ham MD Signed: 10/04/2019 6:06:48 PM By: Deon Pilling Entered By: Linton Ham on 10/04/2019 14:53:00 -------------------------------------------------------------------------------- HPI Details Patient Name: Date of Service: [name], [name] 10/04/2019 1:00 PM Medical Record [id number] Patient Account Number: [id number] Date of Birth/Sex: Treating RN: November 24, 1951 (68 y.o. [name] Primary Care Provider: [name] Other Clinician: Referring Provider: Treating Provider/Extender:Robson, Jeronimo Greaves, Buena Irish in Treatment: 16 History of Present Illness Location: Patient presents with a wound to bilateral feet. Quality: Patient reports experiencing essentially no pain. Severity: Mildly severe wound with no evidence of infection Duration: Patient has had the wound for greater than 2 weeks prior to presenting for treatment HPI Description: The patient is a pleasant 68 yrs old bm here for evaluation of ulcers on the plantar aspect of both feet. 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 He has a left arm graft for access. He has been trying to stay off his feet for weeks but does not seem to have any improvement in the wound. He has been seeing someone at the foot center and was referred to the Wound Care center for further evaluation. 12/30/15 the patient has 3 wounds one over the right first metatarsal head and 2 on the left foot at the left fifth and lleft first metatarsal head. All of these look relatively similar. The one over the left fifth probe to bone I could not prove that any of the others did. I note his MRI in November that did not show osteomyelitis. His peripheral pulses seem robust. All of these underwent surgical debridement to remove callus nonviable skin and subcutaneous tissue 01/06/16; the patient had his sutures removed from the right fifth ray amputation. There may be a small open part of this superiorly but otherwise the incision looks good. Areas over his right first, left first and left fifth metatarsal head all underwent surgical debridement as a varying degree of callus, skin and nonviable subcutaneous tissue. The area that  is most worrisome is the right fifth metatarsal head which has a wound probes precariously close to bone. There is no purulent drainage or erythema 01/13/16; I'm not exactly sure of the status  of the right fifth ray amputation site however he follows with Dr. [name] later this week. The area over his right first plantar metatarsal head, left first and fifth plantar metatarsal head are all in the same status. Thick circumferential callus, nonviable subcutaneous tissue. Culture of the left fifth did not culture last week 01/20/16; all of the patient's wounds appear and roughly the same state although his amputation site on the right lateral foot looks better. His wounds over the right first, left first and left fifth metatarsal heads all underwent difficult surgical debridement removing circumferential callus nonviable skin and subcutaneous tissue. There is no overt evidence of infection in these areas. MRI at the end of December of the left foot did not show osteomyelitis, right foot showed osteomyelitis of the right fifth digit he is now status post amputation. 01/27/16 the patient's wounds over his plantar first and fifth metatarsal heads on the left all appear better having been started on a total contact cast last week. They were debridement of circumferential callus and nonviable subcutaneous tissue as was the wound over the first metatarsal head. His surgical incision on the right also had some light surface debridement done. 03/23/2016 -- the patient was doing really well and most of his wounds had almost completely healed but now he came back today with a history of having a discharge from the area of his right foot on the plantar aspect and also between his first and second toe. He also has had some discharge from the left foot. Addendum: I spoke to the PA [name] at the dialysis center whose fax number is [phone]. We discussed the infection the patient has and she will put  the patient on vancomycin and Fortaz until the final culture report is back. We will fax this as soon as available. 03/30/2016 -- left foot x-ray IMPRESSION:No definitive osteomyelitis noted. X-ray of the right foot -- IMPRESSION: 1. Soft tissue swelling. Prior amputation right fifth digit. No acute or focal bony abnormality identified. If osteomyelitis remains a clinical concern, MRI can be obtained. 2. Peripheral vascular disease. His culture reports have grown an MSSA -- and we will fax this report to his hemodialysis center. He was called in a prescription of oral doxycycline but I have told her not to fill this in as he is already on IV antibiotics. 04/06/2016 -- a few days ago, I spoke to the hemodialysis center nurse who had stopped the IV antibiotics and he was given a prescription for doxycycline 100 mg by mouth twice a day for a week and he is on this at the present time. 05/11/2016 -- he has recently seen his PCP this week and his hemoglobin A1c was 7. He is working on his paperwork to get his orthotic shoes. 05/25/2016 -- -- x-ray of the left foot IMPRESSION:No acute bony abnormality. No radiographic changes of acute osteomyelitis. No change since prior study. X-ray of the right foot -- IMPRESSION: Postsurgical changes are seen involving the fifth toe. No evidence of acute osteomyelitis. he developed a large blister on the medial part of his right foot and this opened out and drain fluid. 06/01/2016 -- he has his MRI to be done this afternoon. 06/15/2016 -- MRI of the left forefoot without contrast shows 2 separate regions of cutaneous and subcutaneous edema and possible ulceration and blistering along the ball of the foot. No obvious osteomyelitis identified. MRI of the right foot showed cutaneous and subcutaneous thickening plantar to the first digit sesamoid with an  ulcer crater but no underlying osteomyelitis is identified. 06/22/2016 -- the right foot plantar ulcer has been  draining a lot of seropurulent material for the last few days. 06/30/2016 -- spoke to the dialysis center and I believe I spoke to [place] a PA at the center who discussed with me and agreed to putting Legrand Como on vancomycin until his cultures arrive. On review of his culture report no WBCs were seen or no organisms were seen and the culture was reincubated for better growth. The final report is back and there were no predominant growth including Streptococcus or Staphylococcus. Clinically though he has a lot of drainage from both wounds a lot of undermining and there is further blebs on the left foot towards the interspace between his first and second toe. 08/10/2016 -- the culture from the right foot showed normal skin flora and there was no Staphylococcus aureus was group A streptococcus isolated. 09/21/2016 -- -- MRI of the right foot was done on 09/13/2016 - IMPRESSION: Findings most consistent with acute osteomyelitis throughout the great toe, sesamoid bones and plantar aspect of the head of the first metatarsal. Fluid in the sheath of the flexor tendon of the great toe could be sympathetic but is worrisome for septic tenosynovitis. First MTP joint effusion worrisome for septic joint. He was admitted to the hospital on 09/11/2016 and treated for a fever with vancomycin and cefepime. He was seen by Dr. Bobby Rumpf of infectious disease who recommended 6 weeks treatment with vancomycin and ceftazidime with his hemodialysis and to continue to see as in the wound clinic. Vascular consult was pending. The patient was discharged home on 09/14/2016 and he would continue with IV antibiotics for 6 weeks. 09/28/16 wound appears reasonably healthy. Continuing with total contact cast 10/05/16 wound is smaller and looking healthy. Continue with total contact cast. He continues on IV antibiotics 10/12/2016 -- he has developed a new wound on the dorsal aspect of his left big toe and this is a  superficial injury with no surrounding cellulitis. He has completed 30 days of IV antibiotics and is now ready to start his hyperbaric oxygen therapy as per his insurance company's recommendation. 10/19/2016 -- after the cast was removed on the right side he has got good resolution of his ulceration on the right plantar foot. he has a new wound on the left plantar foot in the region of his fourth metatarsal and this will need sharp debridement. 10/26/2016 -- Xray of the right foot complete: IMPRESSION: Changes consistent with osteomyelitis involving the head of the first metatarsal and base of the first proximal phalanx. The sesamoid bones are also likely involved given their positioning. 11/02/2016 -- there still awaiting insurance clearance for his hyperbaric oxygen therapy and hopefully he will begin treatment soon. 11/09/2016 -- started with hyperbaric oxygen therapy and had some barotrauma to the right ear and this was seen by ENT who was prescribed Afrin drops and would probably continue with HBO and he is scheduled for myringotomy tubes on Friday 11/16/2016 -- he had his myringotomy tubes placed on Friday and has been doing much better after that with some fluid draining out after hyperbaric treatment today. The pain was much better. 11/30/2016 -- over the last 2 days he noticed a swelling and change of color of his right second toe and this had been draining minimal fluid. 12/07/2016 -- x-ray of the right foot -- IMPRESSION:1. Progressive ulceration at the distal aspect of the second digit with significant soft tissue swelling and osseous  changes in the distal phalanx compatible with osteomyelitis. 2. Chronic osteomyelitis at the first MTP joint. 3. Ulcerations at the second and third toes as well without definite osseous changes. Osteomyelitis is not excluded. 4. Fifth digit amputation. On 12/01/2016 I spoke to Dr. Bobby Rumpf, the infectious disease specialist, who kindly agreed  to treat this with IV antibiotics and he would call in the order to the dialysis center and this has been discussed in detail with the patient who will make the appropriate arrangements. The patient will also book an appointment as soon as possible to see Dr. [name] in the office. Of note the patient has not been on antibiotics this entire week as the dialysis center did not receive any orders from Dr. [name]. I got in touch with Dr. [name] who tells me the patient has an appointment to see him this coming Wednesday. 12/14/2016 -- the patient is on ceftazidime and vancomycin during his dialysis, and I understand this was put on by his nephrologist Dr. Raliegh Ip possibly after speaking with infectious disease Dr. [name] 12/23/16; patient was on my schedule today for a wound evaluation as he had difficulties with his schedule earlier this week. I note that he is on vancomycin and ceftazidine at dialysis. In spite of this he arrives today with a new wound on the base of the right second toe this easily probes to bone. The known wound at the tip of the second toe With this area. He also has a superficial area on the medial aspect of the third toe on the side of the DIP. This does not appear to have much depth. The area on the plantar left foot is a deep area but did not probe the bone 12/28/16 -- he has brought in some lab work and the most recent labs done showed a hemoglobin of 12.1 hematocrit of 36.3, neutrophils of 51% WBC count of 5.6, BN of 53, albumin of 4.1 globulin of 3.7, vancomycin 13 g per mL. 01/04/2017 -- he saw Dr. [name] who has recommended a amputation of the right second toe and he is awaiting this date. He sees Dr. Bobby Rumpf of infectious disease tomorrow. The bone culture taken on 12/28/2016 had no growth in 2 days. 01/11/2017 -- was seen by Dr. Bobby Rumpf regarding the management and has recommended a eval by vascular surgeons. He recommended to continue the antibiotics  during his hemodialysis. Xray of the left foot -- IMPRESSION: No acute fracture, dislocation, or osseous erosion identified. 01/18/2017 -- he has his vascular workup later today and he is going to have his right foot second toe amputation this coming Friday by Dr. [name]. We have also put in for a another 30 treatments with hyperbaric oxygen therapy 01/25/2017 -- I have reviewed Dr. Donnetta Hutching is vascular report from last week where he reviewed him and thought his vascular function was good enough to heal his amputation site and no further tests were recommended. He also had his orthopedic related to surgery which is still pending the notes and we will review these next week. 02/01/2017 -- the operative remote of Dr. 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 02/08/2017 --he was seen by Dr. [name]  on 02/03/2017 -- patient is on ceftaz and Vanco. after a thorough review he had recommended to continue with antibiotics during hemodialysis. The patient was seen by Dr. [name], but I do not find any follow-up note on the electronic medical record. he has removed the dressing over the right foot to remove the sutures and asked him to see him back in a month's time 02/22/2017 -- patient has been febrile and has been having symptoms of the upper respiratory tract infection but has not been checked for the flu and it's been over 4 days now. He says he is feeling better today. Some drainage between his left first and second toe and this needed to be looked at 03/08/2017 -- was seen by Dr. Bobby Rumpf on 03/07/2017 after review he will stop his antibiotics  and see him back in a month to see if he is feeling well. 03/15/2017 -- he has completed his course of hyperbaric oxygen therapy and is doing fine with his health otherwise. 03/22/2017 -- his nutritionist at the dialysis center has recommended a protein supplement to help build his collagen and we will prescribe this for him when he has the details 05/03/2017 -- he recently noticed the area on the plantar aspect of his fifth metatarsal head which opened out and had minimal drainage. 05/10/2017 -- -- x-ray of the left foot -- IMPRESSION:1. No convincing conventional radiographic evidence of active osteomyelitis.2. Active soft tissue ulceration at the tip of the fifth digit, and at the lateral aspect of the foot adjacent the base of the fifth metatarsal. 3. Surgical changes of prior fourth toe amputation. 4. 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 5. Small vessel atherosclerotic vascular calcifications. 6. 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 ======= Readmission after 5 weeks: 06/15/2017 -- the patient returns after 5 weeks having had an MRI done on 05/17/2017 MRI of the left foot without contrast showed soft tissue ulcer overlying the base of the fifth metatarsal. 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 No drainable fluid collection to suggest an abscess. He was in hospital between 05/16/2017 and 05/25/2017 -- and on discharge was asked to follow-up with Dr. [name] and Dr. [name]. He was treated 2 weeks post discharge with vancomycin plus ceftriaxone with hemodialysis and oral metronidazole 500 mg 3 times a day. 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 last hemoglobin A1c was 6.7% He had a postoperative MR of the foot on 05/23/2017 -- which showed no new areas of cortical bone  loss, edema or soft tissue ulceration to suggest osteomyelitis. the patient has completed his course of IV antibiotics during dialysis and is to see the infectious disease doctor tomorrow. Dr. [name] had seen him after suture removal and asked him to keep the wound with a dry dressing 06/21/2017 -- he was seen by Dr. Bobby Rumpf of infectious disease on 06/16/2017 -- he stopped his Flagyl and will see him back in 6 weeks. He was asked to follow-up with Dr. [name] and with the wound clinic clinic. 07/05/17; bone biopsy from last time showed acute osteomyelitis. This is from the reminiscent in the left fifth metatarsal. Culture result is apparently still pending [holding for anaerobe}. From my understanding in this case this is been a progressive necrotic wound which is deteriorated markedly over the last 3 weeks since he returned  here. He now has a large area of exposed bone which was biopsied and cultured last week. Dr. Graylon Good has put him on vancomycin and Fortaz during his hemodialysis and Flagyl orally. He is to see Dr. [name] next week 07/19/2017 -- the patient was reviewed by Dr. [name] of orthopedics who reviewed the case in detail and agreed with the plan to continue with IV antibiotics, aggressive wound care and hyperbaric oxygen therapy. He would see him back in 3 weeks' time 08/09/2017 -- saw Dr. Bobby Rumpf on 08/03/2017 -he was restarted on his antibiotics for 6 weeks which included vanco, ceftaz and Flagyl. he recommended continuing his antibiotics for 6 weeks and reevaluate his completion date. He would continue with wound care and hyperbaric oxygen therapy. 08/16/2017 -- I understand he will be completing his 6 weeks of antibiotics sometime later this week. 09/19/2017 -- he has been without his wound VAC for the last week due to lack of supplies. He has been packing his wound with silver alginate. 10/04/2017 -- he has an appointment with Dr. [name] tomorrow and hence we  will not apply the wound VAC after his dressing changes today. 10/11/2017 -- he was seen by Dr. Bobby Rumpf on 10/05/2017, noted that the patient was currently off antibiotics, and after thorough review he recommended he follow-up with the wound care as per plan and no further antibiotics at this point. 11/29/17; patient is arrived for the wound on his left lateral foot.Marland Kitchen He is completed IV antibiotics and 2 rounds of hyperbaric oxygen for treatment of underlying osteomyelitis. He arrives today with a surface on most of the wound area however our intake nurse noted drainage from the superior aspect. This was brought to my attention. 12/13/2017 -- the right plantar foot had a large bleb and once the bleb was opened out a large callus and subcutaneous debris was removed and he has a plantar ulcer near the fourth metatarsal head 12/28/17 on evaluation today patient appears to be doing acutely worse in regard to his left foot. The wound which has been appearing to do better as now open up more deeply there is bone palpable at the base of the wound unfortunately. He tells me that this feels "like it did when he had osteomyelitis previously" he also noted that his second toe on the left foot appears to be doing worse and is swollen there does appear to be some fluid collected underneath. His right foot plantar ulcer appears to be doing somewhat better at this point and there really is no complication at the site currently. No fevers, chills, nausea, or vomiting noted at this time. Patient states that he normally has no pain at this site however. Today he is having significant pain. 01/03/18; culture done last week showed methicillin sensitive staph aureus and group B strep. He was on Septra however he arrives today with a fever of 101. He has a functional dialysis shunt in his left arm but has had no pain here. No cough. He still makes some urine no dysuria he did have abdominal pain nausea and diarrhea  over the weekend but is not had any diarrhea since yesterday. Otherwise he has no specific complaints 01/05/18; the patient returns today in follow-up for his presentation from 1/8. At that point he was febrile. I gave him some Levaquin adjusted for his dialysis status. He tells me the fever broke that night and it is not likely that this was actually a wound infection. He is gone on to have  an MRI of the left foot; this showed interval development of an abnormal signal from the base of the third and fourth metatarsal and in the cuboid reminiscent consistent with osteomyelitis. There is no mention of the left second toe I think which was a concern when it was ordered. The patient is taking his Levaquin 01/10/18; the patient has completed his antibiotics today. The area on the left lateral foot is smaller but it still probes easily to bone. He has underlying osteomyelitis here and sees Dr. Megan Salon of infectious disease this week The area on the right third plantar metatarsal head still requires debridement not much change in dimensions He has a new wound on the medial tip of his right third toe again this probes to bone. He only noticed this 2 days ago 01/17/18; the patient saw Dr. [name] last week and he is back on Vanco and Fortaz at dialysis. 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 We are previously seeing him for a wound also on the base of the fourth med head on the right. X- ray of this area did not show osteomyelitis in relation to a new wound on the tip of his right third toe. Again this probes to bone. Finally his second toe on the left which didn't really show anything on the MRI at least no report appears to have a separated cutaneous area which I think is going to come right off the tip of his toe and leave exposed bone. Whether this is infectious or ischemic I am not  clear Culture I did from the third toe last week which was his new wound was negative. Plain x-ray on the right foot did not show osteomyelitis in the toes 01/24/18; the patient is going went to see Dr. [name]. He is going to have a amputation of the left second and right third toe although paradoxically the left second toe looks better than last week. We have treating a deep probing wound on the left lateral foot and the area over the third metatarsal head on the right foot. 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 Also a debridement including bone of the left lateral foot and a closure which is still sutured. This is a bit surprising. Also apparent debridement of the base of the right third toe wound. 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 I'm not sure exactly where we are in the frame of things here. The wound on the left lateral foot is still sutured also the amputation sites of the left second and right third toe. He is been using Xeroform to the sutured areas silver alginate to the right foot 02/14/18; he continues on Rocephin at dialysis for osteomyelitis. I still don't have a good sense of where we are in the treatment duration. The wounds on the left lateral foot had the sutures removed and this clearly still probes deeply. We debrided the area with a #5 curet. We'll use silver alginate to the wound the area over the right third met head also required debridement of callus skin and subcutaneous tissue and necrotic debris over the wound surface. This tunnels superiorly but  I did not unroofed this today. The areas on the tip of his toes have a crusted surface eschar I did not debridement this either. 02/21/18; he continues on Rocephin at dialysis for  osteomyelitis. I don't have a good sense of where we are and the treatment duration. The wounds on the left lateral foot is callused over on presentation but requires debridement. The area on the right third med head plantar aspect actually is measuring smaller 02/28/18;patient is on Vanco and Fortaz at dialysis, I'm not sure why I thought this was Rocephin above unless it is been changed. I don't have a good sense of time frame here. Using silver collagen to the area on the left lateral foot and right third med head plantar foot 03/07/18; patient is completing bank and Fortaz at dialysis soon. He is using Silver collagen to the area on the left lateral foot and the right third metatarsal head. He has a smaller superficial wound distal to the left lateral foot wound. 03/14/18-he is here in follow-up evaluation for multiple ulcerations to his bilateral feet. He presents with a new ulceration to the plantar aspect of the left foot underneath the blister, this was deroofed to reveal a partial thickness ulcer. He is voicing no complaints or concerns, tolerated dialysis yesterday. We will continue with same treatment plan and he will follow-up next week 03/21/18; the patient has a area on the lateral left foot which still has a small probing area. The overall surface area of the wound is better. He presented with a new ulceration on the left foot plantar fifth metatarsal head last week. The area on the right third metatarsal head appears smaller.using silver alginate to all wound areas. The patient is changing the dressing himself. He is using a Darco forefoot offloading are on the right and a healing sandal on the left 03/28/18; original wound left lateral foot. Still nowhere close to looking like a heeling surface secondary wound on the left lateral foot at the base of his question fifth metatarsal which was new last week Right third metatarsal head. We have been using silver alginate all wounds 04/04/18;  the original wound on the left lateral foot again heavy callus surrounding thick nonviable subcutaneous tissue all requiring debridement. The new wound from 2 weeks ago just near this at the base of the fifth metatarsal head still looks about the same Unfortunately there is been deterioration in the third medical head wound on the right which now probes to bone. I must say this was a superficial wound at one point in time and I really don't have a good frame of reference year. I'll have to go back to look through records about what we know about the right foot. 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 He is also been to see Dr. [name]. Somewhere in here as somebody is ordered a VASCULAR evaluation which was done on 03/22/18. On the right is anterior tibial artery was monophasic triphasic at the posterior tibial artery. On the left anterior tibial artery monophasic posterior tibial artery monophasic. ABI in the right was 1.43 on the left 1.21. TBIs on the right at 0.41 and on the left at 0.32. A vascular consult was recommended and I think has been arranged. 04/11/18; Mr. Maloof has not had an MRI of the right foot since 2017. Recent x-ray of the right foot done in January and February was negative. However  he has had a major deterioration in the wound over the third met head. He is completed antibiotics last week at dialysis [place]. I think he is going to see vascular in follow-up. The areas on the left lateral foot and left plantar fifth metatarsal head both look satisfactory. I debrided both of these areas although the tissue here looks good. The area on the left lateral foot once probe to bone it certainly does not do that now 04/18/18; MRI of the right foot is on Thursday. He has what looks to be serosanguineous purulent drainage coming out of the wound over the right third metatarsal head  today. He completed antibiotics bank and Fortaz 2 weeks ago at dialysis. He has a vascular follow-up with regards to his arterial insufficiency although I don't exactly see when that is booked. The areas on the left foot including the lateral left foot and the plantar left fifth metatarsal head look about the same. 04/25/18-He is here in follow-up evaluation for bilateral foot ulcers. MRI obtained was negative for osteomyelitis. Wound culture was negative. We will continue with same treatment plan he'll follow-up next week 05/02/18; the right plantar foot wound over the third metatarsal head actually looks better than when I last saw this. His MRI was negative for osteomyelitis wound culture was negative. On the left plantar foot both wounds on the plantar fifth metatarsal head and on the lateral foot both are covered in a very hard circumferential callus. 05/09/18; right plantar foot wound over the third metatarsal head stable from last week. Left plantar foot wound over the fifth metatarsal head also stable but with callus around both wound areas The area on the left lateral foot had thick callus over a surface and I had some thoughts about leaving this intact however it felt boggy. We've been using silver alginate all wounds 05/16/18; since the patient was last here he was hospitalized from 5/15 through 5/19. 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 This grew MRSA. He was placed on vancomycin.MRI of the left foot suggested "progressive" osteomyelitis and bone destruction of the cuboid and the base of the fifth metatarsal. Stable erosive changes of the base of the second metatarsal. I'm wondering if they're aware that he had surgery and debridement in the area of the underlying bone previously by Dr. [name]. In any case, He was also revascularized with an angioplasty and stenting of the  left tibial peroneal trunk and angioplasty of the left posterior tibial artery. He is on vancomycin at dialysis. He has a 2 week follow-up with infectious disease. He has vascular surgery follow-up. He was started on Plavix. 05/23/18; the patient's wound on the lateral left foot at the level of the fifth metatarsal head and the plantar wound on the plantar fifth metatarsal head both look better. The area on the right third metatarsal head still has depth and undermining. We've been using silver alginate. He is on vancomycin. He has been revascularized on the left 05/30/18; he continues on vancomycin at dialysis. Revascularized on the left. The area on the left lateral foot is just about closed. Unfortunately the area over the plantar fifth metatarsal head undermining superiorly as does the area over the right third metatarsal head. Both of these significantly deteriorated from last week. He has appointments next week with infectious disease and orthopedics I delayed putting on a cast on the left  until those appointments which therefore we made we'll bring him in on Friday the 14th with the idea of a cast on the left foot 06/06/18; he continues on vancomycin at dialysis. Revascularized on the left. He arrives today with a ballotable swelling just above the area on the left lateral foot where his previous wound was. He has no pain but he is reasonably insensate. The difficult areas on the plantar left fifth metatarsal head looks stable whereas the area on the right third plantar metatarsal head about the same as last week there is undermining here although I did not unroofed this today. He required a considerable debridement of the swelling on the left lateral foot area and I unroofed an abscess with a copious amount of brown purulent material which I obtained for culture. I don't believe during his recent hospitalization he had any further imaging although I need to review this. Previous cultures  from this area done in this clinic showed MRSA, I would be surprised if this is not what this is currently even though he is on vancomycin. 06/13/18; he continues on vancomycin at dialysis however he finishes this on Sunday and then graduates the doxycycline previously prescribed by Dr. [name]. The abscess site on the lateral foot that I unroofed last time grew moderate amounts of methicillin-resistant staph aureus that is both vancomycin and tetracycline sensitive so we should be okay from that regard. He arrives today in clinic with a connection between the abscess site and the area on the lateral foot. We've been using silver alginate to all his wound areas. The right third metatarsal head wound is measuring smaller. Using silver alginate on both wound areas 06/20/18; transitioning to doxycycline prescribed by Dr. [name]. The abscess site on the lateral foot that I unroofed 2 weeks ago grew MRSA. That area has largely closed down although the lateral part of his wound on the fifth metatarsal head still probes to bone. I suspect all of this was connected. On the right third metatarsal head smaller looking wound but surrounded by nonviable tissue that once again requires debridement using silver alginate on both areas 06/27/18; on doxycycline 100 twice a day. The abscess on the lateral foot has closed down. Although he still has the wound on the left fifth metatarsal head that extends towards the lateral part of the foot. The most lateral part of this wound probes to bone Right third metatarsal head still a deep probing wound with undermining. Nevertheless I elected not to debridement this this week If everything is copious static next week on the left I'm going to attempt a total contact cast 07/04/18; he is tolerating his doxycycline. The abscess on the left lateral foot is closed down although there is still a deep wound here that probes to bone. We will use silver collagen under the total  contact cast Silver collagen to the deep wound over the right third metatarsal head is well 07/11/18; he is tolerating doxycycline. The left lateral fifth plantar metatarsal head still probes deeply but I could not probe any bone. We've been using silver collagen and this will be the second week under the total contact cast Silver collagen to deep wound over the right third metatarsal head as well 07/18/18; he is still taking doxycycline. The left lateral fifth plantar metatarsal this week probes deeply with a large amount of exposed bone. Quite a deterioration. He required extensive debridement. Specimens of bone for pathology and CNS obtained Also considerable debridement on the right third metatarsal  head 07/25/18; bone for pathology last week from the lateral left foot showed osteomyelitis. CandS of the bone Enterobacter. And Klebsiella. He is now on ciprofloxacin and the doxycycline is stopped [Dr. [name] of infectious disease] area He has an appointment with Dr. [name] tomorrow I'm going to leave the total contact cast off for this week. May wish to try to reapply that next week or the week after depending on the wound bed looks. I'm not sure if there is an operative option here, previously is followed with Dr. [name] 08/01/18 on evaluation today patient presents for reevaluation. He has been seen by infectious disease, Dr. [name], and he has placed the patient back on Ceftaz currently to be given to him at dialysis three times a week. 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 Fortunately there does not appear to be the evidence of infection at this point in time. Overall the patient has been tolerating the dressing changes without complication. We are no longer due lives in the cast of left foot secondary to the infection obviously. 08/10/18 on evaluation today patient appears to be doing rather well all things considered  in regard to his left plantar foot ulcer. He does have a significant callous on the lateral portion of his left foot he wonders if I can help clean that away to some degree today. Fortunately he is having no evidence of infection at this time which is good news. No fevers chills noted. With that being said his right plantar foot actually does have a significant callous buildup around the wound opening this seems to not be doing as well as I would like at this point. I do believe that he would benefit from sharp debridement at this site. Subsequently I think a total contact cast would be helpful for the right foot as well. 08/17/18; the patient arrived today with a total contact cast on the right foot. This actually looks quite good. He had gone to see Dr. Megan Salon of infectious disease about the osteomyelitis on the left foot. I think he is on IV Fortaz at dialysis although I am not exactly sure of the rationale for the Fortaz at the time of this dictation. He also arrived in today with a swelling on the lateral left foot this is the site of his original wounds in fact when I first saw this man when he was under Dr. Ardeen Garland care I think this was the site of where the wound was located. It was not particularly tender however using a small scalpel I opened it to [place] some moderate amount of purulent drainage. 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 Last MRI was in May that showed progressive osteomyelitis and bone destruction of the cuboid and base of the fifth metatarsal. Last week he had an abscess over this same area this was removed. Culture was negative. We are continuing with a total contact cast to the area on the third metatarsal head on the right and making some good progress here. The patient asked me again about renal transplant. He is not on the list because  of the open wounds. 08/31/18; apparently the patient had an interruption in the [place] but he is now back on this at dialysis. Apparently there was a misunderstanding and Dr. Crissie Figures orders. Due to have  the MRI of the left foot tonight. The area on the right foot continues to have callus thick skin and subcutaneous tissue around the wound edge that requires constant debridement however the wound is smaller we are using a total contact cast in this area. Alginate all wounds 09/07/18; without much surprise the MRI of his left foot showed osteomyelitis in the reminiscent of his fourth metatarsal but also the third metatarsal. She arrives in clinic today with the right foot and a total contact cast. There was purulent drainage coming out of the wound which I have cultured. Marked deterioration here with undermining widely around the wound orifice. Using pickups and a scalpel I remove callus and subcutaneous tissue from a substantial new opening. In a similar fashion the area on the left lateral foot that was blistered last week I have opened this and remove skin and subcutaneous tissue from this area to expose a obvious new wound. I think there is extension and communication between all of this on the left foot. The patient is on Fortaz at dialysis. Culture done of the right foot. We are clearly not making progress here. We have made him an appointment with Dr. [name] of orthopedics. I think an amputation of the left leg may be discussed. I don't think there is anything that can be done with foot salvage. 09/14/18; culture from the right foot last time showed a very resistant MRSA. This is resistant to doxycycline. I'm going to try to get linezolid at least for a week. He does not have a appointment with Dr. [name] yet. 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 I'll send a message to Dr. [name] of infectious disease. I want the patient to see  Dr. [name] to go over the pros and cons of an amputation which I think will be a BKA. The patient had a question about whether this is curative or not. I told him that I thought it would be although spread of staph aureus infection is not unheard of. MRI of the right foot was done at the end of April 2019 did not show osteomyelitis. The patient last saw Dr. [name] on 9/3. I'll send Dr. [name] a message. I would really like him to weigh the pros and cons of a BKA on the left otherwise he'll probably need IV antibiotics and perhaps hyperbaric oxygen again. He has not had a good response to this in the past. His MRI earlier this month showed progressive damage in the remnants of the fourth and third metatarsal 09/21/2018; sees Dr. [name] of orthopedics next week to discuss a left BKA in response to the osteomyelitis in the left foot. Using silver alginate to all wound areas. He is completing the Zyvox I did put in for him after last culture showed MRSA 09/29/2018; patient saw Dr. [name] of orthopedics discussed the osteomyelitis in the left foot third and fourth metatarsals. He did not recommend urgent surgery but certainly stated the only surgical option would be a BKA. He is communicating with Dr. [name]. The problem here is the instability of the areas on his foot which constantly generate draining abscesses. I will communicate with Dr. [name] about this. He has an appointment on 10/23 10/05/2018; patient sees Dr. [name] on 10/23. I have sent him a secure message not ordered any additional antibiotics for now. Patient continues to have 2 open areas on the left plantar foot at the fifth metatarsal head and  the lateral aspect of the foot. These have not changed all that much. The area on the right third met head still has thick callus and surrounding subcutaneous tissue we have been using silver alginate 10/12/2018; patient sees Dr. [name] or colleague on 10/23. 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 The area on the right plantar foot is just about closed. The superior wound on the left fifth metatarsal head is callused over but I am not sure if this is closed. The area below it is about the same. Considerable amount of callus on the lateral foot. We have been using silver alginate to the wounds 10/19/2018; the patient saw Dr. [name] on 10/22. He wishes to try and continue to save the left foot. He has been given 6 weeks of oral ciprofloxacin. We continue to put a total contact cast on the right foot. The area over the fifth metatarsal head on the left is callused over/perhaps healed but I did not remove the callus to find out. He still has the wound on the left lateral midfoot requiring debridement. We have been using silver alginate to all wound areas 10/26/2018 On the left foot our intake nurse noted some purulent drainage from the inferior wound [currently the only one that is not callused over] On the right foot even though he is in total contact cast a considerable amount of thick black callus and surface eschar. On this side we have been using silver alginate under a total contact cast. he remains on ciprofloxacin as prescribed by Dr. [name] 11/02/2018; culture from last week which is done of the probing area on the left midfoot wound showed MRSA "few". I am going to need to contact Dr. [name] which I will do today I think he is going to need IV antibiotics again. The area on the left foot which was callused on the side is clearly separating today all of this was removed a copious amounts of callus and necrotic subcutaneous tissue. The area on the right plantar foot actually remained healthy looking with a healthy granulated base. We are using silver alginate to all wound areas 11/09/2018; unfortunately neither 1 of the patient's wounds areas looks at all satisfactory. On the left he has considerable  necrotic debris from the plantar wound laterally over the foot. I removed copious amounts of material including callus skin and subcutaneous tissue. On the right foot he arrives with undermining and frankly purulent drainage. Specimen obtained for culture and debridement of the callused skin and subcutaneous tissue from around the circumference. Because of this I cannot put him back in a total contact cast but to be truthful we are really unfortunately not making a lot of progress I did put in a secure text message to Dr. [name] wondering about the MRSA on the left foot. He suggested vancomycin although this is not started. I was left wondering if he expected me to send this into dialysis. Has we have more purulence on the right side wait for that result before calling dialysis 11/16/2018; I called dialysis earlier this week to get the vancomycin started. I think he got the first dose on Tuesday. The patient tells me that he fell earlier this week twisting his left foot and ankle and he is swelling. He continues to not look well. He had blood cultures done at dialysis on Tuesday he is not been informed of the results. 12/07/2018; the patient was admitted to hospital  from 11/22/2018 through 12/02/2018. He underwent a left BKA. Apparently had staph sepsis. In the meantime being off the right foot this is closed over. He follows up with Dr. 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 Subsequent to this he actually had multiple falls in fact he tells me to following which calls the wound to be his apparently. He has been seeing Dr. [name] and his physician assistant in the interim. They actually did want him to come back for reevaluation to see if there's anything we can do for me wound care perspective the help this area to heal more appropriately. The patient states that he does not have  a tremendous amount of pain which is good news. No fevers, chills, nausea, or vomiting noted at this time. We have gotten approval from Dr. [name] office had [facility] for [place] to treat the patient for his stop wound. This is due to the fact that the patient was in the 90 day postop global. 1/21; the patient does not have an open wound on the right foot although he does have some pressure areas that will need to be padded when he is transferring. The dehisced surgical wound looks clean although there is some undermining of 1.5 cm superiorly. 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 This appears healthy but there is undermining. TheraSkin #1 2/11; TheraSkin #2 2/25; TheraSkin #3. Wound is measuring smaller 3/10; TheraSkin #4 wound is measuring smaller 3/24; TheraSkin #5 wound is measuring smaller and looks healthy. 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 He has no open area on the right foot although he does have some callused area over the original third metatarsal head wound. The third metatarsal is also subluxed on the right foot. I have warned him today that he cannot consider wearing a prosthesis for at least a month but he can go for measurements. He is going to need to keep the right foot padded is much as possible in his diabetic shoe indefinitely READMISSION 06/12/2019 [name] is a type II diabetic on dialysis. He has been in this clinic multiple times with wounds on his bilateral lower extremities. 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 He also has a history of wounds on his right foot but tells me that everything is going well here. He actually came in here with his prosthesis walking with a cane. We were all really quite gratified to see this He states over the  last several weeks he has had a painful area at the tip of the left third finger. His dialysis shunt in his is in the left upper arm and this particularly hurts during dialysis when his fingers get numb and there is a lot of pain in the tip of his left third finger. I believe he is seen vascular surgery. He had a Doppler done to evaluate for a dialysis steal syndrome. He is going for a banding procedure 1 week from today towards the left AV fistula. I think if that is unsuccessful they will be looking at creating a new shunt. He had a course of doxycycline when he which he finished about a week ago. He has been  using Bactroban to the finger 6/25; the patient had his banding procedure and things seem to be going better. He is not having pain in his hand at dialysis. The area on the tip of his finger seems about closed. He did however have a paronychia I the last time I saw him. I have been advising him to use topical Bactroban washing the finger. Culture I did last time showed a few staph epidermidis which I was willing to dismiss as superficial skin contaminant. He arrives today with the finger wound looking better however the paronychia a seems worse 7/9; 2-week follow-up. He does not have an open wound on the tip of his third left finger. I thought he had an initial ischemic wound on the tip of the finger as well as a paronychia a medially. All of this appears to be closed. 8/6-Patient returns after 3 weeks for follow-up and has a right second met head plantar callus with a significant area of maceration hiding an ulcer ABI repeated today on right - 1.26 8/13-Patient returns at 1 week, the right second met head plantar wound appears to be slightly better, it is surrounded by the callus area we are using silver alginate 8/20; the patient came back to clinic 2 weeks ago with a wound on the right second metatarsal head. He apparently told me he developed callus in this area but also went away from  his custom shoes to a pair of running shoes. Using silver alginate. 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 There might be much we can do to offload this although the patient tells me he has a wheelchair that he is using to try and stay off the wound is much as possible 8/27; right second metatarsal head. Still small punched out area with thick callus and subcutaneous tissue around the wound. We have been using silver collagen. This is always been an issue with this man's wound especially on this foot 9/3; right second met head. Still the same punched-out area with thick callus and subcutaneous tissue around the wound removing the circumference demonstrates repetitively undermining area. I have removed all the subcutaneous tissue associated with this. We have been using silver collagen 9/15; right second metatarsal head. Same punched-out thick callus and subcutaneous tissue around the wound area. Still requiring debridement we have been using silver collagen I changed back to silver alginate X-ray last time showed no evidence of osteomyelitis. Culture showed Klebsiella and he was started and Keflex apparently just 4 days ago 9/24; right second metatarsal head. He is completed the antibiotics I gave him for 10 days. Same punched-out thick callus and subcutaneous tissue around the area. Still requiring debridement. We have been using silver alginate. The area itself looks swollen and this could be just [place] that comes on this area with walking on a prosthesis on the left however I think he needs an MRI and I am going to order that today. There is not an option to offload this further 10/1; right second plantar metatarsal head. MRI booked for October 6. Generally looking better today using silver alginate 10/8; right second plantar metatarsal head. MRI that was done on 10/6 was negative for osteomyelitis noted prior amputations of the second and fourth toes at the MTP.  Prior amputation of the third toe to the base of the middle phalanx. Prior amputation of the fifth toe to the head of the metatarsal. They also noted a partially non-united stress fracture at the  base of the third metatarsal. He does not recall about hearing about this. He did not have any pain but then again he has reduced sensation from diabetic neuropathy Electronic Signature(s) Signed: 10/04/2019 5:49:31 PM By: Linton Ham MD Entered By: Linton Ham on 10/04/2019 14:46:18 -------------------------------------------------------------------------------- Physical Exam Details Patient Name: Date of Service: [name], [name] 10/04/2019 1:00 PM Medical Record [id number] Patient Account Number: [id number] Date of Birth/Sex: Treating RN: April 04, 1951 (68 y.o. [name] Primary Care Provider: [name] Other Clinician: Referring Provider: Treating Provider/Extender:Robson, Jeronimo Greaves, Buena Irish in Treatment: 16 Constitutional Patient is hypotensive. However he appears well. Pulse regular and within target range for patient.Marland Kitchen Respirations regular, non-labored and within target range.. Temperature is normal and within the target range for the patient.Marland Kitchen Appears in no distress. Notes Wound exam; somewhat better today. Still callus subcutaneous tissue removed with a #3 curette there is slight undermining laterally. Electronic Signature(s) Signed: 10/04/2019 5:49:31 PM By: Linton Ham MD Entered By: Linton Ham on 10/04/2019 14:49:47 -------------------------------------------------------------------------------- Physician Orders Details Patient Name: Date of Service: ANTIONIO, NEGRON 10/04/2019 1:00 PM Medical Record [id number] Patient Account Number: [id number] Date of Birth/Sex: Treating RN: [birth year] (68 y.o. 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 Dressing Change Frequency Wound #41 Right Metatarsal head second Change Dressing every other day. Skin Barriers/Peri-Wound Care Barrier cream - as needed for maceration, wetness, or redness. Wound Cleansing May shower and wash wound with soap and water. Primary Wound Dressing Wound #41 Right Metatarsal head second Calcium Alginate with Silver Secondary Dressing Wound #41 Right Metatarsal head second Kerlix/Rolled Gauze Dry Gauze Other: - felt to periwound and place one below wound. lightly wrap coban to hold dressing in place. felt / pad wound Edema Control Avoid standing for long periods of time Elevate legs to the level of the heart or above for 30 minutes daily and/or when sitting, a frequency of: - throughout the day. Off-Loading Other: - felt to periwound and place one below wound. Additional Orders / Instructions Other: - minimize walking and standing on foot to aid in wound healing and callus buildup. Electronic Signature(s) Signed: 10/04/2019 5:49:31 PM By: Linton Ham MD Signed: 10/04/2019 6:06:48 PM By: Deon Pilling Entered By: Deon Pilling on 10/04/2019 14:04:04 -------------------------------------------------------------------------------- Problem List Details Patient Name: Date of Service: [place]OR[place], BARIA 10/04/2019 1:00 PM Medical Record [id number] Patient Account Number: [id number] Date of Birth/Sex: Treating RN: Apr 03, 1951 (68 y.o. 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, 06/12/2019 No Yes other extremity Inactive Problems ICD-10 Code Description Active Date Inactive Date L03.114 Cellulitis of left upper limb 06/12/2019 06/12/2019 L98.498 Non-pressure chronic ulcer of skin of other sites with other 06/12/2019 06/12/2019 specified severity S61.209D Unspecified open wound of unspecified finger without damage 06/12/2019 06/12/2019 to nail, subsequent encounter Resolved Problems  Electronic Signature(s) Signed: 10/04/2019 5:49:31 PM By: Linton Ham MD Entered By: Linton Ham on 10/04/2019 14:45:05 -------------------------------------------------------------------------------- Progress Note Details Patient Name: Date of Service: ROHIN, KREJCI 10/04/2019 1:00 PM Medical Record [id number] Patient Account Number: [id number] Date of Birth/Sex: Treating RN: 1951/06/11 (68 y.o. [name] Primary Care Provider: [name] Other Clinician: Referring Provider: Treating Provider/Extender:Robson, Jeronimo Greaves, Buena Irish in Treatment: 16 Subjective History of Present Illness (HPI) The following HPI elements were documented for the patient's wound: Location: Patient presents with a wound to bilateral feet. Quality: Patient reports experiencing essentially no pain. Severity: Mildly severe wound with no evidence of infection Duration: Patient has had the wound for greater than 2 weeks prior to presenting for treatment The patient is a pleasant 68 yrs old bm here for evaluation of ulcers on the plantar aspect of both feet. 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 He has a left arm graft for access. He has been trying to stay off his feet for weeks but does not seem to have any improvement in the wound. He has been seeing someone at the foot center and was referred to the Wound Care center for further evaluation. 12/30/15 the patient has 3 wounds one over the right first metatarsal head and 2 on the left foot at the left fifth and lleft first metatarsal head. All of these look relatively similar. The one over the left fifth probe to bone I could not prove that any of the others did. I note his MRI in November that did not show osteomyelitis. His peripheral pulses seem robust. All of these underwent surgical debridement to remove callus nonviable skin and subcutaneous tissue 01/06/16; the patient had his sutures removed from the right fifth ray amputation. There may be a small open part of this superiorly but otherwise the incision looks good. Areas over his right first, left first and left fifth metatarsal head all underwent surgical debridement as a varying degree of callus, skin and nonviable subcutaneous tissue. The area that is most worrisome is the right fifth metatarsal head which has a wound probes precariously close to bone. There is no purulent drainage or erythema 01/13/16; I'm not exactly sure of the status of the right fifth ray amputation site however he follows with Dr. [name] later this week. The area over his right first plantar metatarsal head, left first and fifth plantar metatarsal head are all in the same status. Thick circumferential callus, nonviable subcutaneous tissue. Culture of the left fifth did not culture last week 01/20/16; all of the patient's wounds appear and roughly the same state although his amputation site on the right lateral foot looks better. His wounds over the right first, left first and left fifth metatarsal heads all underwent difficult surgical debridement removing  circumferential callus nonviable skin and subcutaneous tissue. There is no overt evidence of infection in these areas. MRI at the end of December of the left foot did not show osteomyelitis, right foot showed osteomyelitis of the right fifth digit he is now status post amputation. 01/27/16 the patient's wounds over his plantar first and fifth metatarsal heads on the left all appear better having been started on a total contact cast last week. They were debridement of circumferential callus and nonviable subcutaneous tissue as was the wound over the first metatarsal head. His surgical incision on the right also had some light surface debridement done. 03/23/2016 -- the patient was doing really well and  most of his wounds had almost completely healed but now he came back today with a history of having a discharge from the area of his right foot on the plantar aspect and also between his first and second toe. He also has had some discharge from the left foot. Addendum: I spoke to the PA [name] at the dialysis center whose fax number is [phone]. We discussed the infection the patient has and she will put the patient on vancomycin and Fortaz until the final culture report is back. We will fax this as soon as available. 03/30/2016 -- left foot x-ray IMPRESSION:No definitive osteomyelitis noted. X-ray of the right foot -- IMPRESSION: 1. Soft tissue swelling. Prior amputation right fifth digit. No acute or focal bony abnormality identified. If osteomyelitis remains a clinical concern, MRI can be obtained. 2. Peripheral vascular disease. His culture reports have grown an MSSA -- and we will fax this report to his hemodialysis center. He was called in a prescription of oral doxycycline but I have told her not to fill this in as he is already on IV antibiotics. 04/06/2016 -- a few days ago, I spoke to the hemodialysis center nurse who had stopped the IV antibiotics and he was given a  prescription for doxycycline 100 mg by mouth twice a day for a week and he is on this at the present time. 05/11/2016 -- he has recently seen his PCP this week and his hemoglobin A1c was 7. He is working on his paperwork to get his orthotic shoes. 05/25/2016 -- -- x-ray of the left foot IMPRESSION:No acute bony abnormality. No radiographic changes of acute osteomyelitis. No change since prior study. X-ray of the right foot -- IMPRESSION: Postsurgical changes are seen involving the fifth toe. No evidence of acute osteomyelitis. he developed a large blister on the medial part of his right foot and this opened out and drain fluid. 06/01/2016 -- he has his MRI to be done this afternoon. 06/15/2016 -- MRI of the left forefoot without contrast shows 2 separate regions of cutaneous and subcutaneous edema and possible ulceration and blistering along the ball of the foot. No obvious osteomyelitis identified. MRI of the right foot showed cutaneous and subcutaneous thickening plantar to the first digit sesamoid with an ulcer crater but no underlying osteomyelitis is identified. 06/22/2016 -- the right foot plantar ulcer has been draining a lot of seropurulent material for the last few days. 06/30/2016 -- spoke to the dialysis center and I believe I spoke to [place] a PA at the center who discussed with me and agreed to putting Legrand Como on vancomycin until his cultures arrive. On review of his culture report no WBCs were seen or no organisms were seen and the culture was reincubated for better growth. The final report is back and there were no predominant growth including Streptococcus or Staphylococcus. Clinically though he has a lot of drainage from both wounds a lot of undermining and there is further blebs on the left foot towards the interspace between his first and second toe. 08/10/2016 -- the culture from the right foot showed normal skin flora and there was no Staphylococcus aureus was group A  streptococcus isolated. 09/21/2016 -- -- MRI of the right foot was done on 09/13/2016 - IMPRESSION: Findings most consistent with acute osteomyelitis throughout the great toe, sesamoid bones and plantar aspect of the head of the first metatarsal. Fluid in the sheath of the flexor tendon of the great toe could be sympathetic but is worrisome  for septic tenosynovitis. First MTP joint effusion worrisome for septic joint. He was admitted to the hospital on 09/11/2016 and treated for a fever with vancomycin and cefepime. He was seen by Dr. Bobby Rumpf of infectious disease who recommended 6 weeks treatment with vancomycin and ceftazidime with his hemodialysis and to continue to see as in the wound clinic. Vascular consult was pending. The patient was discharged home on 09/14/2016 and he would continue with IV antibiotics for 6 weeks. 09/28/16 wound appears reasonably healthy. Continuing with total contact cast 10/05/16 wound is smaller and looking healthy. Continue with total contact cast. He continues on IV antibiotics 10/12/2016 -- he has developed a new wound on the dorsal aspect of his left big toe and this is a superficial injury with no surrounding cellulitis. He has completed 30 days of IV antibiotics and is now ready to start his hyperbaric oxygen therapy as per his insurance company's recommendation. 10/19/2016 -- after the cast was removed on the right side he has got good resolution of his ulceration on the right plantar foot. he has a new wound on the left plantar foot in the region of his fourth metatarsal and this will need sharp debridement. 10/26/2016 -- Xray of the right foot complete: IMPRESSION: Changes consistent with osteomyelitis involving the head of the first metatarsal and base of the first proximal phalanx. The sesamoid bones are also likely involved given their positioning. 11/02/2016 -- there still awaiting insurance clearance for his hyperbaric oxygen therapy and  hopefully he will begin treatment soon. 11/09/2016 -- started with hyperbaric oxygen therapy and had some barotrauma to the right ear and this was seen by ENT who was prescribed Afrin drops and would probably continue with HBO and he is scheduled for myringotomy tubes on Friday 11/16/2016 -- he had his myringotomy tubes placed on Friday and has been doing much better after that with some fluid draining out after hyperbaric treatment today. The pain was much better. 11/30/2016 -- over the last 2 days he noticed a swelling and change of color of his right second toe and this had been draining minimal fluid. 12/07/2016 -- x-ray of the right foot -- IMPRESSION:1. Progressive ulceration at the distal aspect of the second digit with significant soft tissue swelling and osseous changes in the distal phalanx compatible with osteomyelitis. 2. Chronic osteomyelitis at the first MTP joint. 3. Ulcerations at the second and third toes as well without definite osseous changes. Osteomyelitis is not excluded. 4. Fifth digit amputation. On 12/01/2016 oo I spoke to Dr. Bobby Rumpf, the infectious disease specialist, who kindly agreed to treat this with IV antibiotics and he would call in the order to the dialysis center and this has been discussed in detail with the patient who will make the appropriate arrangements. The patient will also book an appointment as soon as possible to see Dr. [name] in the office. Of note the patient has not been on antibiotics this entire week as the dialysis center did not receive any orders from Dr. [name]. I got in touch with Dr. [name] who tells me the patient has an appointment to see him this coming Wednesday. 12/14/2016 -- the patient is on ceftazidime and vancomycin during his dialysis, and I understand this was put on by his nephrologist Dr. Raliegh Ip possibly after speaking with infectious disease Dr. [name] 12/23/16; patient was on my schedule today for a wound  evaluation as he had difficulties with his schedule earlier this week. I note that he is on vancomycin  and ceftazidine at dialysis. In spite of this he arrives today with a new wound on the base of the right second toe this easily probes to bone. The known wound at the tip of the second toe With this area. He also has a superficial area on the medial aspect of the third toe on the side of the DIP. This does not appear to have much depth. The area on the plantar left foot is a deep area but did not probe the bone 12/28/16 -- he has brought in some lab work and the most recent labs done showed a hemoglobin of 12.1 hematocrit of 36.3, neutrophils of 51% WBC count of 5.6, BN of 53, albumin of 4.1 globulin of 3.7, vancomycin 13 g per mL. 01/04/2017 -- he saw Dr. [name] who has recommended a amputation of the right second toe and he is awaiting this date. He sees Dr. Bobby Rumpf of infectious disease tomorrow. The bone culture taken on 12/28/2016 had no growth in 2 days. 01/11/2017 -- was seen by Dr. Bobby Rumpf regarding the management and has recommended a eval by vascular surgeons. He recommended to continue the antibiotics during his hemodialysis. Xray of the left foot -- IMPRESSION: No acute fracture, dislocation, or osseous erosion identified. 01/18/2017 -- he has his vascular workup later today and he is going to have his right foot second toe amputation this coming Friday by Dr. [name]. We have also put in for a another 30 treatments with hyperbaric oxygen therapy 01/25/2017 -- I have reviewed Dr. Donnetta Hutching is vascular report from last week where he reviewed him and thought his vascular function was good enough to heal his amputation site and no further tests were recommended. He also had his orthopedic related to surgery which is still pending the notes and we will review these next week. 02/01/2017 -- the operative remote of Dr. 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 02/08/2017 --he was seen by Dr. [name] on 02/03/2017 -- patient is on [place]. after a thorough review he had recommended to continue with antibiotics during hemodialysis. The patient was seen by Dr. [name], but I do not find any follow-up note on the electronic medical record. he has removed the dressing over the right foot to remove the sutures and asked him to see him back in a month's time 02/22/2017 -- patient has been febrile and has been having symptoms of the upper respiratory tract infection but has not been checked for the flu and it's been over 4 days now. He says he is feeling better today. Some drainage between his left first and second toe and this needed to be looked at 03/08/2017 -- was seen by Dr. Bobby Rumpf on 03/07/2017 after review he will stop his antibiotics and see him back in a month to see if he is feeling well. 03/15/2017 -- he has completed his course of hyperbaric oxygen therapy and is doing fine with his health otherwise. 03/22/2017 -- his nutritionist at the dialysis center has recommended a protein supplement to help build his collagen and we will prescribe this for him when he has the details 05/03/2017 -- he recently noticed the area on the  plantar aspect of his fifth metatarsal head which opened out and had minimal drainage. 05/10/2017 -- -- x-ray of the left foot -- IMPRESSION:1. No convincing conventional radiographic evidence of active osteomyelitis.2. Active soft tissue ulceration at the tip of the fifth digit, and at the lateral aspect of the foot adjacent the base of the fifth metatarsal. 3. Surgical  changes of prior fourth toe amputation. 4. 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 5. Small vessel atherosclerotic vascular calcifications. 6. 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 ======= Readmission after 5 weeks: 06/15/2017 -- the patient returns after 5 weeks having had an MRI done on 05/17/2017 MRI of the left foot without contrast showed soft tissue ulcer overlying the base of the fifth metatarsal. 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 No drainable fluid collection to suggest an abscess. He was in hospital between 05/16/2017 and 05/25/2017 -- and on discharge was asked to follow-up with Dr. [name] and Dr. [name]. He was treated 2 weeks post discharge with vancomycin plus ceftriaxone with hemodialysis and oral metronidazole 500 mg 3 times a day. 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 last hemoglobin A1c was 6.7% He had a postoperative MR of the foot on 05/23/2017 -- which showed no new areas of cortical bone loss, edema or soft tissue ulceration to suggest osteomyelitis. the patient has completed his course of IV antibiotics during dialysis and is to see the infectious disease doctor tomorrow. Dr. [name] had seen him after suture removal and asked him to keep the wound with a dry dressing 06/21/2017 -- he was seen by Dr. Bobby Rumpf of infectious disease on 06/16/2017 -- he stopped his Flagyl and will see him back in 6 weeks. He was asked to follow-up with Dr. [name] and with the wound clinic clinic. 07/05/17; bone biopsy from last time showed acute osteomyelitis. This is from the reminiscent in the left fifth metatarsal. Culture result is apparently still pending [holding for anaerobe}. From my understanding in this case this is been a progressive necrotic wound which  is deteriorated markedly over the last 3 weeks since he returned here. He now has a large area of exposed bone which was biopsied and cultured last week. Dr. Graylon Good has put him on vancomycin and Fortaz during his hemodialysis and Flagyl orally. He is to see Dr. [name] next week 07/19/2017 -- the patient was reviewed by Dr. [name] of orthopedics who reviewed the case in detail and agreed with the plan to continue with IV antibiotics, aggressive wound care and hyperbaric oxygen therapy. He would see him back in 3 weeks' time 08/09/2017 -- saw Dr. Bobby Rumpf on 08/03/2017 -he was restarted on his antibiotics for 6 weeks which included vanco, ceftaz and Flagyl. he recommended continuing his antibiotics for 6 weeks and reevaluate his completion date. He would continue with wound care and hyperbaric oxygen therapy. 08/16/2017 -- I understand he will be completing his 6 weeks of antibiotics sometime later this week. 09/19/2017 -- he has been without his wound VAC for the last week due to lack of supplies. He has been packing his wound with silver alginate. 10/04/2017 -- he has an appointment with Dr. [name] tomorrow and hence we will not apply the wound VAC after his dressing changes today. 10/11/2017 -- he was seen by Dr. Bobby Rumpf on 10/05/2017, noted that  the patient was currently off antibiotics, and after thorough review he recommended he follow-up with the wound care as per plan and no further antibiotics at this point. 11/29/17; patient is arrived for the wound on his left lateral foot.Marland Kitchen He is completed IV antibiotics and 2 rounds of hyperbaric oxygen for treatment of underlying osteomyelitis. He arrives today with a surface on most of the wound area however our intake nurse noted drainage from the superior aspect. This was brought to my attention. 12/13/2017 -- the right plantar foot had a large bleb and once the bleb was opened out a large callus and subcutaneous debris was removed  and he has a plantar ulcer near the fourth metatarsal head 12/28/17 on evaluation today patient appears to be doing acutely worse in regard to his left foot. The wound which has been appearing to do better as now open up more deeply there is bone palpable at the base of the wound unfortunately. He tells me that this feels "like it did when he had osteomyelitis previously" he also noted that his second toe on the left foot appears to be doing worse and is swollen there does appear to be some fluid collected underneath. His right foot plantar ulcer appears to be doing somewhat better at this point and there really is no complication at the site currently. No fevers, chills, nausea, or vomiting noted at this time. Patient states that he normally has no pain at this site however. Today he is having significant pain. 01/03/18; culture done last week showed methicillin sensitive staph aureus and group B strep. He was on Septra however he arrives today with a fever of 101. He has a functional dialysis shunt in his left arm but has had no pain here. No cough. He still makes some urine no dysuria he did have abdominal pain nausea and diarrhea over the weekend but is not had any diarrhea since yesterday. Otherwise he has no specific complaints 01/05/18; the patient returns today in follow-up for his presentation from 1/8. At that point he was febrile. I gave him some Levaquin adjusted for his dialysis status. He tells me the fever broke that night and it is not likely that this was actually a wound infection. He is gone on to have an MRI of the left foot; this showed interval development of an abnormal signal from the base of the third and fourth metatarsal and in the cuboid reminiscent consistent with osteomyelitis. There is no mention of the left second toe I think which was a concern when it was ordered. The patient is taking his Levaquin 01/10/18; the patient has completed his antibiotics today. The area on  the left lateral foot is smaller but it still probes easily to bone. He has underlying osteomyelitis here and sees Dr. Megan Salon of infectious disease this week ooThe area on the right third plantar metatarsal head still requires debridement not much change in dimensions [facility] has a new wound on the medial tip of his right third toe again this probes to bone. He only noticed this 2 days ago 01/17/18; the patient saw Dr. [name] last week and he is back on Vanco and Fortaz at dialysis. 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 We are previously seeing him for a wound also on the base of the fourth med head on the right. X- ray of this area did  not show osteomyelitis in relation to a new wound on the tip of his right third toe. Again this probes to bone. Finally his second toe on the left which didn't really show anything on the MRI at least no report appears to have a separated cutaneous area which I think is going to come right off the tip of his toe and leave exposed bone. Whether this is infectious or ischemic I am not clear Culture I did from the third toe last week which was his new wound was negative. Plain x-ray on the right foot did not show osteomyelitis in the toes 01/24/18; the patient is going went to see Dr. [name]. He is going to have a amputation of the left second and right third toe although paradoxically the left second toe looks better than last week. We have treating a deep probing wound on the left lateral foot and the area over the third metatarsal head on the right foot. 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 Also a debridement including bone of the left lateral foot and a closure which is still sutured. This is a bit surprising. Also apparent debridement of the base of the right third toe wound. Bit difficult to tell what he is  been doing but I think it is Xeroform to the amputation sites and the left lateral foot and silver alginate to the right plantar foot 02/09/18; on Rocephin at dialysis for osteomyelitis in the left lateral foot. I'm not sure exactly where we are in the frame of things here. The wound on the left lateral foot is still sutured also the amputation sites of the left second and right third toe. He is been using Xeroform to the sutured areas silver alginate to the right foot 02/14/18; he continues on Rocephin at dialysis for osteomyelitis. I still don't have a good sense of where we are in the treatment duration. The wounds on the left lateral foot had the sutures removed and this clearly still probes deeply. We debrided the area with a #5 curet. We'll use silver alginate to the wound oothe area over the right third met head also required debridement of callus skin and subcutaneous tissue and necrotic debris over the wound surface. This tunnels superiorly but I did not unroofed this today. ooThe areas on the tip of his toes have a crusted surface eschar I did not debridement this either. 02/21/18; he continues on Rocephin at dialysis for osteomyelitis. I don't have a good sense of where we are and the treatment duration. The wounds on the left lateral foot is callused over on presentation but requires debridement. The area on the right third med head plantar aspect actually is measuring smaller 02/28/18;patient is on Vanco and Fortaz at dialysis, I'm not sure why I thought this was Rocephin above unless it is been changed. I don't have a good sense of time frame here. Using silver collagen to the area on the left lateral foot and right third med head plantar foot 03/07/18; patient is completing bank and Fortaz at dialysis soon. He is using Silver collagen to the area on the left lateral foot and the right third metatarsal head. He has a smaller superficial wound distal to the left lateral  foot wound. 03/14/18-he is here in follow-up evaluation for multiple ulcerations to his bilateral feet. He presents with a new ulceration to the plantar aspect of the left foot underneath the blister, this was deroofed to reveal a partial thickness ulcer.  He is voicing no complaints or concerns, tolerated dialysis yesterday. We will continue with same treatment plan and he will follow-up next week 03/21/18; the patient has a area on the lateral left foot which still has a small probing area. The overall surface area of the wound is better. He presented with a new ulceration on the left foot plantar fifth metatarsal head last week. The area on the right third metatarsal head appears smaller.using silver alginate to all wound areas. The patient is changing the dressing himself. He is using a Darco forefoot offloading are on the right and a healing sandal on the left 03/28/18; original wound left lateral foot. Still nowhere close to looking like a heeling surface oosecondary wound on the left lateral foot at the base of his question fifth metatarsal which was new last week ooRight third metatarsal head. ooWe have been using silver alginate all wounds 04/04/18; the original wound on the left lateral foot again heavy callus surrounding thick nonviable subcutaneous tissue all requiring debridement. The new wound from 2 weeks ago just near this at the base of the fifth metatarsal head still looks about the same ooUnfortunately there is been deterioration in the third medical head wound on the right which now probes to bone. I must say this was a superficial wound at one point in time and I really don't have a good frame of reference year. I'll have to go back to look through records about what we know about the right foot. 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 He is also been to see Dr. [name].  Somewhere in here as somebody is ordered a VASCULAR evaluation which was done on 03/22/18. On the right is anterior tibial artery was monophasic triphasic at the posterior tibial artery. On the left anterior tibial artery monophasic posterior tibial artery monophasic. ABI in the right was 1.43 on the left 1.21. TBIs on the right at 0.41 and on the left at 0.32. A vascular consult was recommended and I think has been arranged. 04/11/18; [name] has not had an MRI of the right foot since 2017. Recent x-ray of the right foot done in January and February was negative. However he has had a major deterioration in the wound over the third met head. He is completed antibiotics last week at dialysis [place]. I think he is going to see vascular in follow-up. The areas on the left lateral foot and left plantar fifth metatarsal head both look satisfactory. I debrided both of these areas although the tissue here looks good. The area on the left lateral foot once probe to bone it certainly does not do that now 04/18/18; MRI of the right foot is on Thursday. He has what looks to be serosanguineous purulent drainage coming out of the wound over the right third metatarsal head today. He completed antibiotics bank and Fortaz 2 weeks ago at dialysis. He has a vascular follow-up with regards to his arterial insufficiency although I don't exactly see when that is booked. The areas on the left foot including the lateral left foot and the plantar left fifth metatarsal head look about the same. 04/25/18-He is here in follow-up evaluation for bilateral foot ulcers. MRI obtained was negative for osteomyelitis. Wound culture was negative. We will continue with same treatment plan he'll follow-up next week 05/02/18; the right plantar foot wound over the third metatarsal head actually looks better than  when I last saw this. His MRI was negative for osteomyelitis wound culture was negative. ooOn the left plantar foot  both wounds on the plantar fifth metatarsal head and on the lateral foot both are covered in a very hard circumferential callus. 05/09/18; right plantar foot wound over the third metatarsal head stable from last week. ooLeft plantar foot wound over the fifth metatarsal head also stable but with callus around both wound areas ooThe area on the left lateral foot had thick callus over a surface and I had some thoughts about leaving this intact however it felt boggy. ooWe've been using silver alginate all wounds 05/16/18; since the patient was last here he was hospitalized from 5/15 through 5/19. 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 This grew MRSA. He was placed on vancomycin.MRI of the left foot suggested "progressive" osteomyelitis and bone destruction of the cuboid and the base of the fifth metatarsal. Stable erosive changes of the base of the second metatarsal. I'm wondering if they're aware that he had surgery and debridement in the area of the underlying bone previously by Dr. [name]. In any case, He was also revascularized with an angioplasty and stenting of the left tibial peroneal trunk and angioplasty of the left posterior tibial artery. He is on vancomycin at dialysis. He has a 2 week follow-up with infectious disease. He has vascular surgery follow-up. He was started on Plavix. 05/23/18; the patient's wound on the lateral left foot at the level of the fifth metatarsal head and the plantar wound on the plantar fifth metatarsal head both look better. The area on the right third metatarsal head still has depth and undermining. We've been using silver alginate. He is on vancomycin. He has been revascularized on the left 05/30/18; he continues on vancomycin at dialysis. Revascularized on the left. The area on the left lateral foot is just about closed. Unfortunately the area over the  plantar fifth metatarsal head undermining superiorly as does the area over the right third metatarsal head. Both of these significantly deteriorated from last week. He has appointments next week with infectious disease and orthopedics I delayed putting on a cast on the left until those appointments which therefore we made we'll bring him in on Friday the 14th with the idea of a cast on the left foot 06/06/18; he continues on vancomycin at dialysis. Revascularized on the left. He arrives today with a ballotable swelling just above the area on the left lateral foot where his previous wound was. He has no pain but he is reasonably insensate. The difficult areas on the plantar left fifth metatarsal head looks stable whereas the area on the right third plantar metatarsal head about the same as last week there is undermining here although I did not unroofed this today. He required a considerable debridement of the swelling on the left lateral foot area and I unroofed an abscess with a copious amount of brown purulent material which I obtained for culture. I don't believe during his recent hospitalization he had any further imaging although I need to review this. Previous cultures from this area done in this clinic showed MRSA, I would be surprised if this is not what this is currently even though he is on vancomycin. 06/13/18; he continues on vancomycin at dialysis however he finishes this on Sunday and then graduates the doxycycline previously prescribed by Dr. [name]. The abscess site  on the lateral foot that I unroofed last time grew moderate amounts of methicillin-resistant staph aureus that is both vancomycin and tetracycline sensitive so we should be okay from that regard. He arrives today in clinic with a connection between the abscess site and the area on the lateral foot. We've been using silver alginate to all his wound areas. The right third metatarsal head wound is measuring smaller. Using  silver alginate on both wound areas 06/20/18; transitioning to doxycycline prescribed by Dr. [name]. The abscess site on the lateral foot that I unroofed 2 weeks ago grew MRSA. That area has largely closed down although the lateral part of his wound on the fifth metatarsal head still probes to bone. I suspect all of this was connected. ooOn the right third metatarsal head smaller looking wound but surrounded by nonviable tissue that once again requires debridement using silver alginate on both areas 06/27/18; on doxycycline 100 twice a day. The abscess on the lateral foot has closed down. Although he still has the wound on the left fifth metatarsal head that extends towards the lateral part of the foot. The most lateral part of this wound probes to bone ooRight third metatarsal head still a deep probing wound with undermining. Nevertheless I elected not to debridement this this week ooIf everything is copious static next week on the left I'm going to attempt a total contact cast 07/04/18; he is tolerating his doxycycline. The abscess on the left lateral foot is closed down although there is still a deep wound here that probes to bone. We will use silver collagen under the total contact cast Silver collagen to the deep wound over the right third metatarsal head is well 07/11/18; he is tolerating doxycycline. The left lateral fifth plantar metatarsal head still probes deeply but I could not probe any bone. We've been using silver collagen and this will be the second week under the total contact cast ooSilver collagen to deep wound over the right third metatarsal head as well 07/18/18; he is still taking doxycycline. The left lateral fifth plantar metatarsal this week probes deeply with a large amount of exposed bone. Quite a deterioration. He required extensive debridement. Specimens of bone for pathology and CNS obtained ooAlso considerable debridement on the right third metatarsal head 07/25/18;  bone for pathology last week from the lateral left foot showed osteomyelitis. CandS of the bone Enterobacter. And Klebsiella. He is now on ciprofloxacin and the doxycycline is stopped [Dr. [name] of infectious disease] area He has an appointment with Dr. [name] tomorrow I'm going to leave the total contact cast off for this week. May wish to try to reapply that next week or the week after depending on the wound bed looks. I'm not sure if there is an operative option here, previously is followed with Dr. [name] 08/01/18 on evaluation today patient presents for reevaluation. He has been seen by infectious disease, Dr. [name], and he has placed the patient back on Ceftaz currently to be given to him at dialysis three times a week. 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 Fortunately there does not appear to be the evidence of infection at this point in time. Overall the patient has been tolerating the dressing changes without complication. We are no longer due lives in the cast of left foot secondary to the infection obviously. 08/10/18 on evaluation today patient appears to be doing rather well all things considered in regard  to his left plantar foot ulcer. He does have a significant callous on the lateral portion of his left foot he wonders if I can help clean that away to some degree today. Fortunately he is having no evidence of infection at this time which is good news. No fevers chills noted. With that being said his right plantar foot actually does have a significant callous buildup around the wound opening this seems to not be doing as well as I would like at this point. I do believe that he would benefit from sharp debridement at this site. Subsequently I think a total contact cast would be helpful for the right foot as well. 08/17/18; the patient arrived today with a total contact cast on the right foot. This actually  looks quite good. He had gone to see Dr. Megan Salon of infectious disease about the osteomyelitis on the left foot. I think he is on IV Fortaz at dialysis although I am not exactly sure of the rationale for the Fortaz at the time of this dictation. He also arrived in today with a swelling on the lateral left foot this is the site of his original wounds in fact when I first saw this man when he was under Dr. Ardeen Garland care I think this was the site of where the wound was located. It was not particularly tender however using a small scalpel I opened it to Justin some moderate amount of purulent drainage. 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 Last MRI was in May that showed progressive osteomyelitis and bone destruction of the cuboid and base of the fifth metatarsal. Last week he had an abscess over this same area this was removed. Culture was negative. ooWe are continuing with a total contact cast to the area on the third metatarsal head on the right and making some good progress here. The patient asked me again about renal transplant. He is not on the list because of the open wounds. 08/31/18; apparently the patient had an interruption in the [place] but he is now back on this at dialysis. Apparently there was a misunderstanding and Dr. Crissie Figures orders. Due to have the MRI of the left foot tonight. ooThe area on the right foot continues to have callus thick skin and subcutaneous tissue around the wound edge that requires constant debridement however the wound is smaller we are using a total contact cast in this area. Alginate all wounds 09/07/18; without much surprise the MRI of his left foot showed osteomyelitis in the reminiscent of his fourth metatarsal but also the third metatarsal. She arrives in clinic today with the right foot and a total contact cast. There was  purulent drainage coming out of the wound which I have cultured. Marked deterioration here with undermining widely around the wound orifice. Using pickups and a scalpel I remove callus and subcutaneous tissue from a substantial new opening. ooIn a similar fashion the area on the left lateral foot that was blistered last week I have opened this and remove skin and subcutaneous tissue from this area to expose a obvious new wound. I think there is extension and communication between all of this on the left foot. ooThe patient is on Fortaz at dialysis. Culture done of the right foot. We are clearly not making progress here. ooWe have made him an appointment with Dr. [name] of orthopedics. I think an amputation  of the left leg may be discussed. I don't think there is anything that can be done with foot salvage. 09/14/18; culture from the right foot last time showed a very resistant MRSA. This is resistant to doxycycline. I'm going to try to get linezolid at least for a week. He does not have a appointment with Dr. [name] yet. 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 I'll send a message to Dr. [name] of infectious disease. I want the patient to see Dr. [name] to go over the pros and cons of an amputation which I think will be a BKA. The patient had a question about whether this is curative or not. I told him that I thought it would be although spread of staph aureus infection is not unheard of. MRI of the right foot was done at the end of April 2019 did not show osteomyelitis. The patient last saw Dr. [name] on 9/3. I'll send Dr. [name] a message. I would really like him to weigh the pros and cons of a BKA on the left otherwise he'll probably need IV antibiotics and perhaps hyperbaric oxygen again. He has not had a good response to this in the past. His MRI earlier this month showed progressive damage in the remnants of the fourth and  third metatarsal 09/21/2018; sees Dr. [name] of orthopedics next week to discuss a left BKA in response to the osteomyelitis in the left foot. Using silver alginate to all wound areas. He is completing the Zyvox I did put in for him after last culture showed MRSA 09/29/2018; patient saw Dr. [name] of orthopedics discussed the osteomyelitis in the left foot third and fourth metatarsals. He did not recommend urgent surgery but certainly stated the only surgical option would be a BKA. He is communicating with Dr. [name]. The problem here is the instability of the areas on his foot which constantly generate draining abscesses. I will communicate with Dr. [name] about this. He has an appointment on 10/23 10/05/2018; patient sees Dr. [name] on 10/23. I have sent him a secure message not ordered any additional antibiotics for now. Patient continues to have 2 open areas on the left plantar foot at the fifth metatarsal head and the lateral aspect of the foot. These have not changed all that much. The area on the right third met head still has thick callus and surrounding subcutaneous tissue we have been using silver alginate 10/12/2018; patient sees Dr. [name] or colleague on 10/23. 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 The area on the right plantar foot is just about closed. The superior wound on the left fifth metatarsal head is callused over but I am not sure if this is closed. The area below it is about the same. Considerable amount of callus on the lateral foot. We have been using silver alginate to the wounds 10/19/2018; the patient saw Dr. [name] on 10/22. He wishes to try and continue to save the left foot. He has been given 6 weeks of oral ciprofloxacin. We continue to put a total contact cast on the right foot. The area over the fifth metatarsal head on the left is callused over/perhaps healed but I did not remove the callus  to find out. He still has the wound on the left lateral midfoot requiring debridement. We have been using silver alginate to all wound areas 10/26/2018 ooOn the left  foot our intake nurse noted some purulent drainage from the inferior wound [currently the only one that is not callused over] ooOn the right foot even though he is in total contact cast a considerable amount of thick black callus and surface eschar. On this side we have been using silver alginate under a total contact cast. oohe remains on ciprofloxacin as prescribed by Dr. [name] 11/02/2018; culture from last week which is done of the probing area on the left midfoot wound showed MRSA "few". I am going to need to contact Dr. [name] which I will do today I think he is going to need IV antibiotics again. The area on the left foot which was callused on the side is clearly separating today all of this was removed a copious amounts of callus and necrotic subcutaneous tissue. ooThe area on the right plantar foot actually remained healthy looking with a healthy granulated base. ooWe are using silver alginate to all wound areas 11/09/2018; unfortunately neither 1 of the patient's wounds areas looks at all satisfactory. On the left he has considerable necrotic debris from the plantar wound laterally over the foot. I removed copious amounts of material including callus skin and subcutaneous tissue. On the right foot he arrives with undermining and frankly purulent drainage. Specimen obtained for culture and debridement of the callused skin and subcutaneous tissue from around the circumference. Because of this I cannot put him back in a total contact cast but to be truthful we are really unfortunately not making a lot of progress I did put in a secure text message to Dr. [name] wondering about the MRSA on the left foot. He suggested vancomycin although this is not started. I was left wondering if he expected me to send this into  dialysis. Has we have more purulence on the right side wait for that result before calling dialysis 11/16/2018; I called dialysis earlier this week to get the vancomycin started. I think he got the first dose on Tuesday. The patient tells me that he fell earlier this week twisting his left foot and ankle and he is swelling. He continues to not look well. He had blood cultures done at dialysis on Tuesday he is not been informed of the results. 12/07/2018; the patient was admitted to hospital from 11/22/2018 through 12/02/2018. He underwent a left BKA. Apparently had staph sepsis. In the meantime being off the right foot this is closed over. He follows up with Dr. 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 Subsequent to this he actually had multiple falls in fact he tells me to following which calls the wound to be his apparently. He has been seeing Dr. [name] and his physician assistant in the interim. They actually did want him to come back for reevaluation to see if there's anything we can do for me wound care perspective the help this area to heal more appropriately. The patient states that he does not have a tremendous amount of pain which is good news. No fevers, chills, nausea, or vomiting noted at this time. We have gotten approval from Dr. [name] office had [place] for [place] to treat the patient for his stop wound. This is due to the fact that the patient was in the 90 day postop global. 1/21; the patient does not have an open wound on the right foot although he does have some pressure areas that will need to be  padded when he is transferring. The dehisced surgical wound looks clean although there is some undermining of 1.5 cm superiorly. 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 This appears healthy  but there is undermining. TheraSkin #1 2/11; TheraSkin #2 2/25; TheraSkin #3. Wound is measuring smaller 3/10; TheraSkin #4 wound is measuring smaller 3/24; TheraSkin #5 wound is measuring smaller and looks healthy. 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 He has no open area on the right foot although he does have some callused area over the original third metatarsal head wound. The third metatarsal is also subluxed on the right foot. I have warned him today that he cannot consider wearing a prosthesis for at least a month but he can go for measurements. He is going to need to keep the right foot padded is much as possible in his diabetic shoe indefinitely READMISSION 06/12/2019 [name] is a type II diabetic on dialysis. He has been in this clinic multiple times with wounds on his bilateral lower extremities. 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 He also has a history of wounds on his right foot but tells me that everything is going well here. He actually came in here with his prosthesis walking with a cane. We were all really quite gratified to see this He states over the last several weeks he has had a painful area at the tip of the left third finger. His dialysis shunt in his is in the left upper arm and this particularly hurts during dialysis when his fingers get numb and there is a lot of pain in the tip of his left third finger. I believe he is seen vascular surgery. He had a Doppler done to evaluate for a dialysis steal syndrome. He is going for a banding procedure 1 week from today towards the left AV fistula. I think if that is unsuccessful they will be looking at creating a new shunt. He had a course of doxycycline when he which he finished about a week ago. He has been using Bactroban to the finger 6/25; the patient had his banding  procedure and things seem to be going better. He is not having pain in his hand at dialysis. The area on the tip of his finger seems about closed. He did however have a paronychia I the last time I saw him. I have been advising him to use topical Bactroban washing the finger. Culture I did last time showed a few staph epidermidis which I was willing to dismiss as superficial skin contaminant. He arrives today with the finger wound looking better however the paronychia a seems worse 7/9; 2-week follow-up. He does not have an open wound on the tip of his third left finger. I thought he had an initial ischemic wound on the tip of the finger as well as a paronychia a medially. All of this appears to be closed. 8/6-Patient returns after 3 weeks for follow-up and has a right second met head plantar callus with a significant area of maceration hiding an ulcer ABI repeated today on right - 1.26 8/13-Patient returns at 1 week, the right second met head plantar wound appears to be slightly better, it is surrounded by the callus area we are using silver alginate 8/20; the patient  came back to clinic 2 weeks ago with a wound on the right second metatarsal head. He apparently told me he developed callus in this area but also went away from his custom shoes to a pair of running shoes. Using silver alginate. 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 There might be much we can do to offload this although the patient tells me he has a wheelchair that he is using to try and stay off the wound is much as possible 8/27; right second metatarsal head. Still small punched out area with thick callus and subcutaneous tissue around the wound. We have been using silver collagen. This is always been an issue with this man's wound especially on this foot 9/3; right second met head. Still the same punched-out area with thick callus and subcutaneous tissue around the wound removing the circumference  demonstrates repetitively undermining area. I have removed all the subcutaneous tissue associated with this. We have been using silver collagen 9/15; right second metatarsal head. Same punched-out thick callus and subcutaneous tissue around the wound area. Still requiring debridement we have been using silver collagen I changed back to silver alginate X-ray last time showed no evidence of osteomyelitis. Culture showed Klebsiella and he was started and Keflex apparently just 4 days ago 9/24; right second metatarsal head. He is completed the antibiotics I gave him for 10 days. Same punched-out thick callus and subcutaneous tissue around the area. Still requiring debridement. We have been using silver alginate. The area itself looks swollen and this could be just [place] that comes on this area with walking on a prosthesis on the left however I think he needs an MRI and I am going to order that today. There is not an option to offload this further 10/1; right second plantar metatarsal head. MRI booked for October 6. Generally looking better today using silver alginate 10/8; right second plantar metatarsal head. MRI that was done on 10/6 was negative for osteomyelitis noted prior amputations of the second and fourth toes at the [facility]. Prior amputation of the third toe to the base of the middle phalanx. Prior amputation of the fifth toe to the head of the metatarsal. 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 He does not recall about hearing about this. He did not have any pain but then again he has reduced sensation from diabetic neuropathy Objective Constitutional Patient is hypotensive. However he appears well. Pulse regular and within target range for patient.Marland Kitchen Respirations regular, non-labored and within target range.. Temperature is normal and within the target range for the patient.Marland Kitchen Appears in no distress. Vitals Time Taken: 1:23 PM, Height: 73  in, Weight: 202 lbs, BMI: 26.6, Temperature: 98.4 F, Pulse: 85 bpm, Respiratory Rate: 17 breaths/min, Blood Pressure: 94/60 mmHg, Capillary Blood Glucose: 131 mg/dl. General Notes: patient reported CBG was 131 General Notes: Wound exam; somewhat better today. Still callus subcutaneous tissue removed with a #3 curette there is slight undermining laterally. Integumentary (Hair, Skin) Wound #41 status is Open. Original cause of wound was Gradually Appeared. The wound is located on the Right Metatarsal head second. The wound measures 0.4cm length x 0.4cm width x 0.5cm depth; 0.126cm^2 area and 0.063cm^3 volume. There is Fat Layer (Subcutaneous Tissue) Exposed exposed. There is no tunneling or undermining noted. There is a medium amount of serosanguineous drainage noted. The wound margin is well defined and not attached to the wound base. There is medium (34-66%)  pink, pale granulation within the wound bed. There is a medium (34-66%) amount of necrotic tissue within the wound bed including Adherent Slough. 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 There was a Excisional Skin/Subcutaneous Tissue Debridement with a total area of 0.04 sq cm performed by Ricard Dillon., MD. With the following instrument(s): Curette to remove Viable and Non-Viable tissue/material. Material removed includes Callus, Subcutaneous Tissue, Skin: Dermis, and Fibrin/Exudate after achieving pain control using Lidocaine 4% Topical Solution. A time out was conducted at 13:58, prior to the start of the procedure. A  Minimum amount of bleeding was controlled with Pressure. The procedure was tolerated well with a pain level of 0 throughout and a pain level of 0 following the procedure. Post Debridement Measurements: 0.4cm length x 0.4cm width x 0.5cm depth; 0.063cm^3 volume. Character of Wound/Ulcer Post Debridement is improved. Severity of Tissue Post Debridement is: Limited to breakdown of skin. Post procedure Diagnosis Wound #41: Same as Pre-Procedure Plan Follow-up Appointments: Return Appointment in 1 week. Dressing Change Frequency: Wound #41 Right Metatarsal head second: Change Dressing every other day. Skin Barriers/Peri-Wound Care: Barrier cream - as needed for maceration, wetness, or redness. Wound Cleansing: May shower and wash wound with soap and water. Primary Wound Dressing: Wound #41 Right Metatarsal head second: Calcium Alginate with Silver Secondary Dressing: Wound #41 Right Metatarsal head second: Kerlix/Rolled Gauze Dry Gauze Other: - felt to periwound and place one below wound. lightly wrap coban to hold dressing in place. felt / pad wound Edema Control: Avoid standing for long periods of time Elevate legs to the level of the heart or above for 30 minutes daily and/or when sitting, a frequency of: - throughout the day. Off-Loading: Other: - felt to periwound and place one below wound. Additional Orders / Instructions: Other: - minimize walking and standing on foot to aid in wound healing and callus buildup. 1. I am continuing with silver alginate 2. The patient does not have osteomyelitis I think this is all an offloading issue. Somewhat better today Electronic Signature(s) Signed: 10/08/2019 5:50:55 PM By: Linton Ham MD Signed: 10/09/2019 5:52:09 PM By: Levan Hurst RN, BSN Previous Signature: 10/04/2019 5:49:31 PM Version By: Linton Ham MD Entered By: Levan Hurst on 10/08/2019  08:55:20 -------------------------------------------------------------------------------- [facility] Details Patient Name: Date of Service: [name], [name] 10/04/2019 Medical Record [id number] Patient Account Number: [id number] Date of Birth/Sex: Treating RN: [birth year] (68 y.o. [name], [name] Primary Care Provider: [name] Other Clinician: Referring Provider: Treating Provider/Extender:Robson, Jeronimo Greaves, Buena Irish in Treatment: 16 Diagnosis Coding ICD-10 Codes Code Description E11.621 Type 2 diabetes mellitus with foot ulcer L97.512 Non-pressure chronic ulcer of other part of right foot with fat layer exposed I70.298 Other atherosclerosis of native arteries of extremities, other extremity Facility Procedures The patient participates with Medicare or their insurance follows the Medicare Facility Guidelines: CPT4 Code Description Modifier Quantity [id number] 11042 - DEB SUBQ TISSUE 20 SQ CM/< 1 ICD-10 Diagnosis Description L97.512 Non-pressure chronic ulcer of  other part of right foot with fat layer exposed Physician Procedures CPT4 Code Description: [id number] - WC PHYS SUBQ TISS 20 SQ CM ICD-10 Diagnosis Description L97.512 Non-pressure chronic ulcer  of other part of right foot with Modifier: fat layer e Quantity: 1 xposed Electronic Signature(s) Signed: 10/04/2019 5:49:31 PM By: Linton Ham MD Entered By: Linton Ham on 10/04/2019 14:53:33
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
## 2019-10-05 DIAGNOSIS — Z992 Dependence on renal dialysis: Secondary | ICD-10-CM | POA: Diagnosis not present

## 2019-10-05 DIAGNOSIS — N2581 Secondary hyperparathyroidism of renal origin: Secondary | ICD-10-CM | POA: Diagnosis not present

## 2019-10-05 DIAGNOSIS — N186 End stage renal disease: Secondary | ICD-10-CM | POA: Diagnosis not present
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
## 2019-10-08 DIAGNOSIS — N2581 Secondary hyperparathyroidism of renal origin: Secondary | ICD-10-CM | POA: Diagnosis not present

## 2019-10-08 DIAGNOSIS — Z992 Dependence on renal dialysis: Secondary | ICD-10-CM | POA: Diagnosis not present

## 2019-10-08 DIAGNOSIS — N186 End stage renal disease: Secondary | ICD-10-CM | POA: Diagnosis not present

## 2019-10-10 DIAGNOSIS — N2581 Secondary hyperparathyroidism of renal origin: Secondary | ICD-10-CM | POA: Diagnosis not present

## 2019-10-10 DIAGNOSIS — N186 End stage renal disease: Secondary | ICD-10-CM | POA: Diagnosis not present

## 2019-10-10 DIAGNOSIS — Z992 Dependence on renal dialysis: Secondary | ICD-10-CM | POA: Diagnosis not present

## 2019-10-11 ENCOUNTER — Encounter (HOSPITAL_BASED_OUTPATIENT_CLINIC_OR_DEPARTMENT_OTHER): Payer: Medicare Other | Admitting: Internal Medicine

## 2019-10-11 ENCOUNTER — Other Ambulatory Visit: Payer: Self-pay

## 2019-10-11 DIAGNOSIS — E11621 Type 2 diabetes mellitus with foot ulcer: Secondary | ICD-10-CM | POA: Diagnosis not present

## 2019-10-11 DIAGNOSIS — L97812 Non-pressure chronic ulcer of other part of right lower leg with fat layer exposed: Secondary | ICD-10-CM | POA: Diagnosis not present

## 2019-10-11 DIAGNOSIS — E1151 Type 2 diabetes mellitus with diabetic peripheral angiopathy without gangrene: Secondary | ICD-10-CM | POA: Diagnosis not present

## 2019-10-11 DIAGNOSIS — L97512 Non-pressure chronic ulcer of other part of right foot with fat layer exposed: Secondary | ICD-10-CM | POA: Diagnosis not present

## 2019-10-11 DIAGNOSIS — I70208 Unspecified atherosclerosis of native arteries of extremities, other extremity: Secondary | ICD-10-CM | POA: Diagnosis not present

## 2019-10-11 DIAGNOSIS — E1122 Type 2 diabetes mellitus with diabetic chronic kidney disease: Secondary | ICD-10-CM | POA: Diagnosis not present

## 2019-10-11 DIAGNOSIS — I132 Hypertensive heart and chronic kidney disease with heart failure and with stage 5 chronic kidney disease, or end stage renal disease: Secondary | ICD-10-CM | POA: Diagnosis not present

## 2019-10-12 DIAGNOSIS — Z992 Dependence on renal dialysis: Secondary | ICD-10-CM | POA: Diagnosis not present

## 2019-10-12 DIAGNOSIS — N186 End stage renal disease: Secondary | ICD-10-CM | POA: Diagnosis not present

## 2019-10-12 DIAGNOSIS — N2581 Secondary hyperparathyroidism of renal origin: Secondary | ICD-10-CM | POA: Diagnosis not present

## 2019-10-12 NOTE — Progress Notes (Signed)
[name], [name] ([id number]) Visit Report for 10/11/2019 Debridement Details Patient Name: Date of Service: [name], [name] 10/11/2019 1:45 PM Medical Record [id number] Patient Account Number: [id number] Date of Birth/Sex: 1951-03-12 (68 y.o. M) Treating RN: Levan Hurst Primary Care Provider: [name] Other Clinician: Referring Provider: Treating Provider/Extender:Robson, Birdena Crandall in Treatment: 17 Debridement Performed for Wound #41 Right Metatarsal head second Assessment: Performed By: Physician Ricard Dillon., MD Debridement Type: Debridement Severity of Tissue Pre Fat layer exposed Debridement: Level of Consciousness (Pre- Awake and Alert procedure): Pre-procedure Verification/Time Out Taken: Yes - 15:05 Start Time: 15:05 Total Area Debrided (L x W): 0.2 (cm) x 0.3 (cm) = 0.06 (cm) Tissue and other material Viable, Non-Viable, Callus, Subcutaneous debrided: Level: Skin/Subcutaneous Tissue Debridement Description: Excisional Instrument: Curette Bleeding: Minimum Hemostasis Achieved: Pressure End Time: 15:07 Procedural Pain: 0 Post Procedural Pain: 0 Response to Treatment: Procedure was tolerated well Level of Consciousness Awake and Alert (Post-procedure): Post Debridement Measurements of Total Wound Length: (cm) 0.2 Width: (cm) 0.3 Depth: (cm) 0.9 Volume: (cm) 0.042 Character of Wound/Ulcer Post Improved Debridement: Severity of Tissue Post Debridement: Fat layer exposed Post Procedure Diagnosis Same as Pre-procedure Electronic Signature(s) Signed: 10/11/2019 5:35:18 PM By: Linton Ham MD Signed: 10/12/2019 6:00:55 PM By: Levan Hurst RN, BSN Entered By: Linton Ham on 10/11/2019 15:35:36 -------------------------------------------------------------------------------- HPI Details Patient Name: Date of Service: [name], [name] 10/11/2019 1:45 PM Medical Record [id number] Patient Account Number:  [id number] Date of Birth/Sex: Treating RN: November 17, 1951 (68 y.o. [name] Primary Care Provider: [name] Other Clinician: Referring Provider: Treating Provider/Extender:Robson, Jeronimo Greaves, Buena Irish in Treatment: 17 History of Present Illness Location: Patient presents with a wound to bilateral feet. Quality: Patient reports experiencing essentially no pain. Severity: Mildly severe wound with no evidence of infection Duration: Patient has had the wound for greater than 2 weeks prior to presenting for treatment HPI Description: The patient is a pleasant 68 yrs old bm here for evaluation of ulcers on the plantar aspect of both feet. 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 He has a left arm graft for access. He has been trying to stay off his feet for weeks but does not seem to have any improvement in the wound. He has been seeing someone at the foot center and was referred to the Wound Care center for further evaluation. 12/30/15 the patient has 3 wounds one over the right first metatarsal head and 2 on the left foot at the left fifth and lleft first metatarsal head. All of these look relatively similar. The one over the left fifth probe to bone I could not prove that any of the others did. I note his MRI in November that did not show osteomyelitis. His peripheral pulses seem robust. All of these underwent surgical debridement to remove callus nonviable skin and subcutaneous tissue 01/06/16; the patient had his sutures removed from the right fifth ray amputation. There may be a small open part of this superiorly but otherwise the incision looks good. Areas over his right first, left first and left fifth metatarsal head all underwent surgical debridement as a varying degree of callus, skin and nonviable subcutaneous tissue. The area that is most worrisome is the right fifth metatarsal head which has a wound probes  precariously close to bone. There is no purulent drainage or erythema 01/13/16; I'm not exactly sure of the status of the right fifth ray amputation site however he follows with Dr.  Graves later this week. The area over his right first plantar metatarsal head, left first and fifth plantar metatarsal head are all in the same status. Thick circumferential callus, nonviable subcutaneous tissue. Culture of the left fifth did not culture last week 01/20/16; all of the patient's wounds appear and roughly the same state although his amputation site on the right lateral foot looks better. His wounds over the right first, left first and left fifth metatarsal heads all underwent difficult surgical debridement removing circumferential callus nonviable skin and subcutaneous tissue. There is no overt evidence of infection in these areas. MRI at the end of December of the left foot did not show osteomyelitis, right foot showed osteomyelitis of the right fifth digit he is now status post amputation. 01/27/16 the patient's wounds over his plantar first and fifth metatarsal heads on the left all appear better having been started on a total contact cast last week. They were debridement of circumferential callus and nonviable subcutaneous tissue as was the wound over the first metatarsal head. His surgical incision on the right also had some light surface debridement done. 03/23/2016 -- the patient was doing really well and most of his wounds had almost completely healed but now he came back today with a history of having a discharge from the area of his right foot on the plantar aspect and also between his first and second toe. He also has had some discharge from the left foot. Addendum: I spoke to the PA [name] at the dialysis center whose fax number is [phone]. We discussed the infection the patient has and she will put the patient on vancomycin and Fortaz until the final culture report is back. We  will fax this as soon as available. 03/30/2016 -- left foot x-ray IMPRESSION:No definitive osteomyelitis noted. X-ray of the right foot -- IMPRESSION: 1. Soft tissue swelling. Prior amputation right fifth digit. No acute or focal bony abnormality identified. If osteomyelitis remains a clinical concern, MRI can be obtained. 2. Peripheral vascular disease. His culture reports have grown an MSSA -- and we will fax this report to his hemodialysis center. He was called in a prescription of oral doxycycline but I have told her not to fill this in as he is already on IV antibiotics. 04/06/2016 -- a few days ago, I spoke to the hemodialysis center nurse who had stopped the IV antibiotics and he was given a prescription for doxycycline 100 mg by mouth twice a day for a week and he is on this at the present time. 05/11/2016 -- he has recently seen his PCP this week and his hemoglobin A1c was 7. He is working on his paperwork to get his orthotic shoes. 05/25/2016 -- -- x-ray of the left foot IMPRESSION:No acute bony abnormality. No radiographic changes of acute osteomyelitis. No change since prior study. X-ray of the right foot -- IMPRESSION: Postsurgical changes are seen involving the fifth toe. No evidence of acute osteomyelitis. he developed a large blister on the medial part of his right foot and this opened out and drain fluid. 06/01/2016 -- he has his MRI to be done this afternoon. 06/15/2016 -- MRI of the left forefoot without contrast shows 2 separate regions of cutaneous and subcutaneous edema and possible ulceration and blistering along the ball of the foot. No obvious osteomyelitis identified. MRI of the right foot showed cutaneous and subcutaneous thickening plantar to the first digit sesamoid with an ulcer crater but no underlying osteomyelitis is identified. 06/22/2016 -- the right  foot plantar ulcer has been draining a lot of seropurulent material for the last few days. 06/30/2016 -- spoke  to the dialysis center and I believe I spoke to [place] a PA at the center who discussed with me and agreed to putting [name] on vancomycin until his cultures arrive. On review of his culture report no WBCs were seen or no organisms were seen and the culture was reincubated for better growth. The final report is back and there were no predominant growth including Streptococcus or Staphylococcus. Clinically though he has a lot of drainage from both wounds a lot of undermining and there is further blebs on the left foot towards the interspace between his first and second toe. 08/10/2016 -- the culture from the right foot showed normal skin flora and there was no Staphylococcus aureus was group A streptococcus isolated. 09/21/2016 -- -- MRI of the right foot was done on 09/13/2016 - IMPRESSION: Findings most consistent with acute osteomyelitis throughout the great toe, sesamoid bones and plantar aspect of the head of the first metatarsal. Fluid in the sheath of the flexor tendon of the great toe could be sympathetic but is worrisome for septic tenosynovitis. First MTP joint effusion worrisome for septic joint. He was admitted to the hospital on 09/11/2016 and treated for a fever with vancomycin and cefepime. He was seen by Dr. Bobby Rumpf of infectious disease who recommended 6 weeks treatment with vancomycin and ceftazidime with his hemodialysis and to continue to see as in the wound clinic. Vascular consult was pending. The patient was discharged home on 09/14/2016 and he would continue with IV antibiotics for 6 weeks. 09/28/16 wound appears reasonably healthy. Continuing with total contact cast 10/05/16 wound is smaller and looking healthy. Continue with total contact cast. He continues on IV antibiotics 10/12/2016 -- he has developed a new wound on the dorsal aspect of his left big toe and this is a superficial injury with no surrounding cellulitis. He has completed 30 days of IV  antibiotics and is now ready to start his hyperbaric oxygen therapy as per his insurance company's recommendation. 10/19/2016 -- after the cast was removed on the right side he has got good resolution of his ulceration on the right plantar foot. he has a new wound on the left plantar foot in the region of his fourth metatarsal and this will need sharp debridement. 10/26/2016 -- Xray of the right foot complete: IMPRESSION: Changes consistent with osteomyelitis involving the head of the first metatarsal and base of the first proximal phalanx. The sesamoid bones are also likely involved given their positioning. 11/02/2016 -- there still awaiting insurance clearance for his hyperbaric oxygen therapy and hopefully he will begin treatment soon. 11/09/2016 -- started with hyperbaric oxygen therapy and had some barotrauma to the right ear and this was seen by ENT who was prescribed Afrin drops and would probably continue with HBO and he is scheduled for myringotomy tubes on Friday 11/16/2016 -- he had his myringotomy tubes placed on Friday and has been doing much better after that with some fluid draining out after hyperbaric treatment today. The pain was much better. 11/30/2016 -- over the last 2 days he noticed a swelling and change of color of his right second toe and this had been draining minimal fluid. 12/07/2016 -- x-ray of the right foot -- IMPRESSION:1. Progressive ulceration at the distal aspect of the second digit with significant soft tissue swelling and osseous changes in the distal phalanx compatible with osteomyelitis. 2. Chronic osteomyelitis at  the first MTP joint. 3. Ulcerations at the second and third toes as well without definite osseous changes. Osteomyelitis is not excluded. 4. Fifth digit amputation. On 12/01/2016 I spoke to Dr. Bobby Rumpf, the infectious disease specialist, who kindly agreed to treat this with IV antibiotics and he would call in the order to the dialysis  center and this has been discussed in detail with the patient who will make the appropriate arrangements. The patient will also book an appointment as soon as possible to see Dr. [name] in the office. Of note the patient has not been on antibiotics this entire week as the dialysis center did not receive any orders from Dr. [name]. I got in touch with Dr. [name] who tells me the patient has an appointment to see him this coming Wednesday. 12/14/2016 -- the patient is on ceftazidime and vancomycin during his dialysis, and I understand this was put on by his nephrologist Dr. Raliegh Ip possibly after speaking with infectious disease Dr. [name] 12/23/16; patient was on my schedule today for a wound evaluation as he had difficulties with his schedule earlier this week. I note that he is on vancomycin and ceftazidine at dialysis. In spite of this he arrives today with a new wound on the base of the right second toe this easily probes to bone. The known wound at the tip of the second toe With this area. He also has a superficial area on the medial aspect of the third toe on the side of the DIP. This does not appear to have much depth. The area on the plantar left foot is a deep area but did not probe the bone 12/28/16 -- he has brought in some lab work and the most recent labs done showed a hemoglobin of 12.1 hematocrit of 36.3, neutrophils of 51% WBC count of 5.6, BN of 53, albumin of 4.1 globulin of 3.7, vancomycin 13 g per mL. 01/04/2017 -- he saw Dr. [name] who has recommended a amputation of the right second toe and he is awaiting this date. He sees Dr. Bobby Rumpf of infectious disease tomorrow. The bone culture taken on 12/28/2016 had no growth in 2 days. 01/11/2017 -- was seen by Dr. Bobby Rumpf regarding the management and has recommended a eval by vascular surgeons. He recommended to continue the antibiotics during his hemodialysis. Xray of the left foot -- IMPRESSION: No acute fracture,  dislocation, or osseous erosion identified. 01/18/2017 -- he has his vascular workup later today and he is going to have his right foot second toe amputation this coming Friday by Dr. [name]. We have also put in for a another 30 treatments with hyperbaric oxygen therapy 01/25/2017 -- I have reviewed Dr. Donnetta Hutching is vascular report from last week where he reviewed him and thought his vascular function was good enough to heal his amputation site and no further tests were recommended. He also had his orthopedic related to surgery which is still pending the notes and we will review these next week. 02/01/2017 -- the operative remote of Dr. 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 02/08/2017 --he was seen by Dr. [name] on 02/03/2017 -- patient is on ceftaz and Vanco. after a thorough  review he had recommended to continue with antibiotics during hemodialysis. The patient was seen by Dr. [name], but I do not find any follow-up note on the electronic medical record. he has removed the dressing over the right foot to remove the sutures and asked him to see him back in a month's time 02/22/2017 -- patient has been febrile and has been having symptoms of the upper respiratory tract infection but has not been checked for the flu and it's been over 4 days now. He says he is feeling better today. Some drainage between his left first and second toe and this needed to be looked at 03/08/2017 -- was seen by Dr. Bobby Rumpf on 03/07/2017 after review he will stop his antibiotics and see him back in a month to see if he is feeling well. 03/15/2017 -- he has  completed his course of hyperbaric oxygen therapy and is doing fine with his health otherwise. 03/22/2017 -- his nutritionist at the dialysis center has recommended a protein supplement to help build his collagen and we will prescribe this for him when he has the details 05/03/2017 -- he recently noticed the area on the plantar aspect of his fifth metatarsal head which opened out and had minimal drainage. 05/10/2017 -- -- x-ray of the left foot -- IMPRESSION:1. No convincing conventional radiographic evidence of active osteomyelitis.2. Active soft tissue ulceration at the tip of the fifth digit, and at the lateral aspect of the foot adjacent the base of the fifth metatarsal. 3. Surgical changes of prior fourth toe amputation. 4. 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 5. Small vessel atherosclerotic vascular calcifications. 6. 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 ======= Readmission after 5 weeks: 06/15/2017 -- the patient returns after 5 weeks having had an MRI done on 05/17/2017 MRI of the left foot without contrast showed soft tissue ulcer overlying the base of the fifth metatarsal. 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 No drainable fluid collection to suggest an abscess. He was in hospital between 05/16/2017 and 05/25/2017 -- and on discharge was asked to follow-up with Dr. [name] and Dr. [name]. He was treated 2 weeks post discharge with vancomycin plus ceftriaxone with hemodialysis and oral metronidazole 500 mg 3 times a day. 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 last hemoglobin A1c was 6.7% He had a postoperative MR of the foot on 05/23/2017 -- which showed no new areas of cortical bone loss, edema or soft tissue ulceration to suggest osteomyelitis. the patient has  completed his course of IV antibiotics during dialysis and is to see the infectious disease doctor tomorrow. Dr. [name] had seen him after suture removal and asked him to keep the wound with a dry dressing 06/21/2017 -- he was seen by Dr. Bobby Rumpf of infectious disease on 06/16/2017 -- he stopped his Flagyl and will see him back in 6 weeks. He was asked to follow-up with Dr. [name] and with the wound clinic clinic. 07/05/17; bone biopsy from last time showed acute osteomyelitis. This is from the reminiscent in the left fifth metatarsal. Culture result is apparently still pending [holding for anaerobe}. From my understanding in this case this is been a progressive necrotic wound which is deteriorated markedly over the last 3 weeks since he returned here. He now has a large area of exposed bone which was  biopsied and cultured last week. Dr. Graylon Good has put him on vancomycin and Fortaz during his hemodialysis and Flagyl orally. He is to see Dr. [name] next week 07/19/2017 -- the patient was reviewed by Dr. [name] of orthopedics who reviewed the case in detail and agreed with the plan to continue with IV antibiotics, aggressive wound care and hyperbaric oxygen therapy. He would see him back in 3 weeks' time 08/09/2017 -- saw Dr. Bobby Rumpf on 08/03/2017 -he was restarted on his antibiotics for 6 weeks which included vanco, ceftaz and Flagyl. he recommended continuing his antibiotics for 6 weeks and reevaluate his completion date. He would continue with wound care and hyperbaric oxygen therapy. 08/16/2017 -- I understand he will be completing his 6 weeks of antibiotics sometime later this week. 09/19/2017 -- he has been without his wound VAC for the last week due to lack of supplies. He has been packing his wound with silver alginate. 10/04/2017 -- he has an appointment with Dr. [name] tomorrow and hence we will not apply the wound VAC after his dressing changes today. 10/11/2017 -- he  was seen by Dr. Bobby Rumpf on 10/05/2017, noted that the patient was currently off antibiotics, and after thorough review he recommended he follow-up with the wound care as per plan and no further antibiotics at this point. 11/29/17; patient is arrived for the wound on his left lateral foot.Marland Kitchen He is completed IV antibiotics and 2 rounds of hyperbaric oxygen for treatment of underlying osteomyelitis. He arrives today with a surface on most of the wound area however our intake nurse noted drainage from the superior aspect. This was brought to my attention. 12/13/2017 -- the right plantar foot had a large bleb and once the bleb was opened out a large callus and subcutaneous debris was removed and he has a plantar ulcer near the fourth metatarsal head 12/28/17 on evaluation today patient appears to be doing acutely worse in regard to his left foot. The wound which has been appearing to do better as now open up more deeply there is bone palpable at the base of the wound unfortunately. He tells me that this feels "like it did when he had osteomyelitis previously" he also noted that his second toe on the left foot appears to be doing worse and is swollen there does appear to be some fluid collected underneath. His right foot plantar ulcer appears to be doing somewhat better at this point and there really is no complication at the site currently. No fevers, chills, nausea, or vomiting noted at this time. Patient states that he normally has no pain at this site however. Today he is having significant pain. 01/03/18; culture done last week showed methicillin sensitive staph aureus and group B strep. He was on Septra however he arrives today with a fever of 101. He has a functional dialysis shunt in his left arm but has had no pain here. No cough. He still makes some urine no dysuria he did have abdominal pain nausea and diarrhea over the weekend but is not had any diarrhea since yesterday. Otherwise he has no  specific complaints 01/05/18; the patient returns today in follow-up for his presentation from 1/8. At that point he was febrile. I gave him some Levaquin adjusted for his dialysis status. He tells me the fever broke that night and it is not likely that this was actually a wound infection. He is gone on to have an MRI of the left foot; this showed interval development of an  abnormal signal from the base of the third and fourth metatarsal and in the cuboid reminiscent consistent with osteomyelitis. There is no mention of the left second toe I think which was a concern when it was ordered. The patient is taking his Levaquin 01/10/18; the patient has completed his antibiotics today. The area on the left lateral foot is smaller but it still probes easily to bone. He has underlying osteomyelitis here and sees Dr. Megan Salon of infectious disease this week The area on the right third plantar metatarsal head still requires debridement not much change in dimensions He has a new wound on the medial tip of his right third toe again this probes to bone. He only noticed this 2 days ago 01/17/18; the patient saw Dr. [name] last week and he is back on Vanco and Fortaz at dialysis. 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 We are previously seeing him for a wound also on the base of the fourth med head on the right. X- ray of this area did not show osteomyelitis in relation to a new wound on the tip of his right third toe. Again this probes to bone. Finally his second toe on the left which didn't really show anything on the MRI at least no report appears to have a separated cutaneous area which I think is going to come right off the tip of his toe and leave exposed bone. Whether this is infectious or ischemic I am not clear Culture I did from the third toe last week which was his new wound was negative.  Plain x-ray on the right foot did not show osteomyelitis in the toes 01/24/18; the patient is going went to see Dr. [name]. He is going to have a amputation of the left second and right third toe although paradoxically the left second toe looks better than last week. We have treating a deep probing wound on the left lateral foot and the area over the third metatarsal head on the right foot. 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 Also a debridement including bone of the left lateral foot and a closure which is still sutured. This is a bit surprising. Also apparent debridement of the base of the right third toe wound. 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 I'm not sure exactly where we are in the frame of things here. The wound on the left lateral foot is still sutured also the amputation sites of the left second and right third toe. He is been using Xeroform to the sutured areas silver alginate to the right foot 02/14/18; he continues on Rocephin at dialysis for osteomyelitis. I still don't have a good sense of where we are in the treatment duration. The wounds on the left lateral foot had the sutures removed and this clearly still probes deeply. We debrided the area with a #5 curet. We'll use silver alginate to the wound the area over the right third met head also required debridement of callus skin and subcutaneous tissue and necrotic debris over the wound surface. This tunnels superiorly but I did not unroofed this today. The areas on the tip of  his toes have a crusted surface eschar I did not debridement this either. 02/21/18; he continues on Rocephin at dialysis for osteomyelitis. I don't have a good sense of where we are and the treatment duration. The wounds  on the left lateral foot is callused over on presentation but requires debridement. The area on the right third med head plantar aspect actually is measuring smaller 02/28/18;patient is on Vanco and Fortaz at dialysis, I'm not sure why I thought this was Rocephin above unless it is been changed. I don't have a good sense of time frame here. Using silver collagen to the area on the left lateral foot and right third med head plantar foot 03/07/18; patient is completing bank and Fortaz at dialysis soon. He is using Silver collagen to the area on the left lateral foot and the right third metatarsal head. He has a smaller superficial wound distal to the left lateral foot wound. 03/14/18-he is here in follow-up evaluation for multiple ulcerations to his bilateral feet. He presents with a new ulceration to the plantar aspect of the left foot underneath the blister, this was deroofed to reveal a partial thickness ulcer. He is voicing no complaints or concerns, tolerated dialysis yesterday. We will continue with same treatment plan and he will follow-up next week 03/21/18; the patient has a area on the lateral left foot which still has a small probing area. The overall surface area of the wound is better. He presented with a new ulceration on the left foot plantar fifth metatarsal head last week. The area on the right third metatarsal head appears smaller.using silver alginate to all wound areas. The patient is changing the dressing himself. He is using a [place] forefoot offloading are on the right and a healing sandal on the left 03/28/18; original wound left lateral foot. Still nowhere close to looking like a heeling surface secondary wound on the left lateral foot at the base of his question fifth metatarsal which was new last week Right third metatarsal head. We have been using silver alginate all wounds 04/04/18; the original wound on the left lateral foot again heavy callus surrounding thick nonviable  subcutaneous tissue all requiring debridement. The new wound from 2 weeks ago just near this at the base of the fifth metatarsal head still looks about the same Unfortunately there is been deterioration in the third medical head wound on the right which now probes to bone. I must say this was a superficial wound at one point in time and I really don't have a good frame of reference year. I'll have to go back to look through records about what we know about the right foot. 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 He is also been to see Dr. [name]. Somewhere in here as somebody is ordered a VASCULAR evaluation which was done on 03/22/18. On the right is anterior tibial artery was monophasic triphasic at the posterior tibial artery. On the left anterior tibial artery monophasic posterior tibial artery monophasic. ABI in the right was 1.43 on the left 1.21. TBIs on the right at 0.41 and on the left at 0.32. A vascular consult was recommended and I think has been arranged. 04/11/18; Mr. Cammack has not had an MRI of the right foot since 2017. Recent x-ray of the right foot done in January and February was negative. However he has had a major deterioration in the wound over the third  met head. He is completed antibiotics last week at dialysis [place]. I think he is going to see vascular in follow-up. The areas on the left lateral foot and left plantar fifth metatarsal head both look satisfactory. I debrided both of these areas although the tissue here looks good. The area on the left lateral foot once probe to bone it certainly does not do that now 04/18/18; MRI of the right foot is on Thursday. He has what looks to be serosanguineous purulent drainage coming out of the wound over the right third metatarsal head today. He completed antibiotics bank and Fortaz 2 weeks ago at dialysis. He has a  vascular follow-up with regards to his arterial insufficiency although I don't exactly see when that is booked. The areas on the left foot including the lateral left foot and the plantar left fifth metatarsal head look about the same. 04/25/18-He is here in follow-up evaluation for bilateral foot ulcers. MRI obtained was negative for osteomyelitis. Wound culture was negative. We will continue with same treatment plan he'll follow-up next week 05/02/18; the right plantar foot wound over the third metatarsal head actually looks better than when I last saw this. His MRI was negative for osteomyelitis wound culture was negative. On the left plantar foot both wounds on the plantar fifth metatarsal head and on the lateral foot both are covered in a very hard circumferential callus. 05/09/18; right plantar foot wound over the third metatarsal head stable from last week. Left plantar foot wound over the fifth metatarsal head also stable but with callus around both wound areas The area on the left lateral foot had thick callus over a surface and I had some thoughts about leaving this intact however it felt boggy. We've been using silver alginate all wounds 05/16/18; since the patient was last here he was hospitalized from 5/15 through 5/19. 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 This grew MRSA. He was placed on vancomycin.MRI of the left foot suggested "progressive" osteomyelitis and bone destruction of the cuboid and the base of the fifth metatarsal. Stable erosive changes of the base of the second metatarsal. I'm wondering if they're aware that he had surgery and debridement in the area of the underlying bone previously by Dr. [name]. In any case, He was also revascularized with an angioplasty and stenting of the left tibial peroneal trunk and angioplasty of the left posterior tibial artery. He is on  vancomycin at dialysis. He has a 2 week follow-up with infectious disease. He has vascular surgery follow-up. He was started on Plavix. 05/23/18; the patient's wound on the lateral left foot at the level of the fifth metatarsal head and the plantar wound on the plantar fifth metatarsal head both look better. The area on the right third metatarsal head still has depth and undermining. We've been using silver alginate. He is on vancomycin. He has been revascularized on the left 05/30/18; he continues on vancomycin at dialysis. Revascularized on the left. The area on the left lateral foot is just about closed. Unfortunately the area over the plantar fifth metatarsal head undermining superiorly as does the area over the right third metatarsal head. Both of these significantly deteriorated from last week. He has appointments next week with infectious disease and orthopedics I delayed putting on a cast on the left until those appointments which therefore we made we'll bring him in on  Friday the 14th with the idea of a cast on the left foot 06/06/18; he continues on vancomycin at dialysis. Revascularized on the left. He arrives today with a ballotable swelling just above the area on the left lateral foot where his previous wound was. He has no pain but he is reasonably insensate. The difficult areas on the plantar left fifth metatarsal head looks stable whereas the area on the right third plantar metatarsal head about the same as last week there is undermining here although I did not unroofed this today. He required a considerable debridement of the swelling on the left lateral foot area and I unroofed an abscess with a copious amount of brown purulent material which I obtained for culture. I don't believe during his recent hospitalization he had any further imaging although I need to review this. Previous cultures from this area done in this clinic showed MRSA, I would be surprised if this is not what this is  currently even though he is on vancomycin. 06/13/18; he continues on vancomycin at dialysis however he finishes this on Sunday and then graduates the doxycycline previously prescribed by Dr. [name]. The abscess site on the lateral foot that I unroofed last time grew moderate amounts of methicillin-resistant staph aureus that is both vancomycin and tetracycline sensitive so we should be okay from that regard. He arrives today in clinic with a connection between the abscess site and the area on the lateral foot. We've been using silver alginate to all his wound areas. The right third metatarsal head wound is measuring smaller. Using silver alginate on both wound areas 06/20/18; transitioning to doxycycline prescribed by Dr. [name]. The abscess site on the lateral foot that I unroofed 2 weeks ago grew MRSA. That area has largely closed down although the lateral part of his wound on the fifth metatarsal head still probes to bone. I suspect all of this was connected. On the right third metatarsal head smaller looking wound but surrounded by nonviable tissue that once again requires debridement using silver alginate on both areas 06/27/18; on doxycycline 100 twice a day. The abscess on the lateral foot has closed down. Although he still has the wound on the left fifth metatarsal head that extends towards the lateral part of the foot. The most lateral part of this wound probes to bone Right third metatarsal head still a deep probing wound with undermining. Nevertheless I elected not to debridement this this week If everything is copious static next week on the left I'm going to attempt a total contact cast 07/04/18; he is tolerating his doxycycline. The abscess on the left lateral foot is closed down although there is still a deep wound here that probes to bone. We will use silver collagen under the total contact cast Silver collagen to the deep wound over the right third metatarsal head is well 07/11/18;  he is tolerating doxycycline. The left lateral fifth plantar metatarsal head still probes deeply but I could not probe any bone. We've been using silver collagen and this will be the second week under the total contact cast Silver collagen to deep wound over the right third metatarsal head as well 07/18/18; he is still taking doxycycline. The left lateral fifth plantar metatarsal this week probes deeply with a large amount of exposed bone. Quite a deterioration. He required extensive debridement. Specimens of bone for pathology and CNS obtained Also considerable debridement on the right third metatarsal head 07/25/18; bone for pathology last week from the lateral left foot  showed osteomyelitis. CandS of the bone Enterobacter. And Klebsiella. He is now on ciprofloxacin and the doxycycline is stopped [Dr. [name] of infectious disease] area He has an appointment with Dr. [name] tomorrow I'm going to leave the total contact cast off for this week. May wish to try to reapply that next week or the week after depending on the wound bed looks. I'm not sure if there is an operative option here, previously is followed with Dr. [name] 08/01/18 on evaluation today patient presents for reevaluation. He has been seen by infectious disease, Dr. [name], and he has placed the patient back on Ceftaz currently to be given to him at dialysis three times a week. 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 Fortunately there does not appear to be the evidence of infection at this point in time. Overall the patient has been tolerating the dressing changes without complication. We are no longer due lives in the cast of left foot secondary to the infection obviously. 08/10/18 on evaluation today patient appears to be doing rather well all things considered in regard to his left plantar foot ulcer. He does have a significant callous on the lateral portion  of his left foot he wonders if I can help clean that away to some degree today. Fortunately he is having no evidence of infection at this time which is good news. No fevers chills noted. With that being said his right plantar foot actually does have a significant callous buildup around the wound opening this seems to not be doing as well as I would like at this point. I do believe that he would benefit from sharp debridement at this site. Subsequently I think a total contact cast would be helpful for the right foot as well. 08/17/18; the patient arrived today with a total contact cast on the right foot. This actually looks quite good. He had gone to see Dr. Megan Salon of infectious disease about the osteomyelitis on the left foot. I think he is on IV Fortaz at dialysis although I am not exactly sure of the rationale for the Fortaz at the time of this dictation. He also arrived in today with a swelling on the lateral left foot this is the site of his original wounds in fact when I first saw this man when he was under Dr. Ardeen Garland care I think this was the site of where the wound was located. It was not particularly tender however using a small scalpel I opened it to [place] some moderate amount of purulent drainage. 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 Last MRI was in May that showed progressive osteomyelitis and bone destruction of the cuboid and base of the fifth metatarsal. Last week he had an abscess over this same area this was removed. Culture was negative. We are continuing with a total contact cast to the area on the third metatarsal head on the right and making some good progress here. The patient asked me again about renal transplant. He is not on the list because of the open wounds. 08/31/18; apparently the patient had an interruption in the [place] but he is now  back on this at dialysis. Apparently there was a misunderstanding and Dr. Crissie Figures orders. Due to have the MRI of the left foot tonight. The area on the right  foot continues to have callus thick skin and subcutaneous tissue around the wound edge that requires constant debridement however the wound is smaller we are using a total contact cast in this area. Alginate all wounds 09/07/18; without much surprise the MRI of his left foot showed osteomyelitis in the reminiscent of his fourth metatarsal but also the third metatarsal. She arrives in clinic today with the right foot and a total contact cast. There was purulent drainage coming out of the wound which I have cultured. Marked deterioration here with undermining widely around the wound orifice. Using pickups and a scalpel I remove callus and subcutaneous tissue from a substantial new opening. In a similar fashion the area on the left lateral foot that was blistered last week I have opened this and remove skin and subcutaneous tissue from this area to expose a obvious new wound. I think there is extension and communication between all of this on the left foot. The patient is on Fortaz at dialysis. Culture done of the right foot. We are clearly not making progress here. We have made him an appointment with Dr. [name] of orthopedics. I think an amputation of the left leg may be discussed. I don't think there is anything that can be done with foot salvage. 09/14/18; culture from the right foot last time showed a very resistant MRSA. This is resistant to doxycycline. I'm going to try to get linezolid at least for a week. He does not have a appointment with Dr. [name] yet. 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 I'll send a message to Dr. [name] of infectious disease. I want the patient to see Dr. [name] to go over the pros and cons of an amputation which I think will be a BKA. The patient  had a question about whether this is curative or not. I told him that I thought it would be although spread of staph aureus infection is not unheard of. MRI of the right foot was done at the end of April 2019 did not show osteomyelitis. The patient last saw Dr. [name] on 9/3. I'll send Dr. [name] a message. I would really like him to weigh the pros and cons of a BKA on the left otherwise he'll probably need IV antibiotics and perhaps hyperbaric oxygen again. He has not had a good response to this in the past. His MRI earlier this month showed progressive damage in the remnants of the fourth and third metatarsal 09/21/2018; sees Dr. [name] of orthopedics next week to discuss a left BKA in response to the osteomyelitis in the left foot. Using silver alginate to all wound areas. He is completing the Zyvox I did put in for him after last culture showed MRSA 09/29/2018; patient saw Dr. [name] of orthopedics discussed the osteomyelitis in the left foot third and fourth metatarsals. He did not recommend urgent surgery but certainly stated the only surgical option would be a BKA. He is communicating with Dr. [name]. The problem here is the instability of the areas on his foot which constantly generate draining abscesses. I will communicate with Dr. [name] about this. He has an appointment on 10/23 10/05/2018; patient sees Dr. [name] on 10/23. I have sent him a secure message not ordered any additional antibiotics for now. Patient continues to have 2 open areas on the left plantar foot at the fifth metatarsal head and the lateral aspect of the foot. These have not changed all that  much. The area on the right third met head still has thick callus and surrounding subcutaneous tissue we have been using silver alginate 10/12/2018; patient sees Dr. [name] or colleague on 10/23. 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 The area on the right  plantar foot is just about closed. The superior wound on the left fifth metatarsal head is callused over but I am not sure if this is closed. The area below it is about the same. Considerable amount of callus on the lateral foot. We have been using silver alginate to the wounds 10/19/2018; the patient saw Dr. [name] on 10/22. He wishes to try and continue to save the left foot. He has been given 6 weeks of oral ciprofloxacin. We continue to put a total contact cast on the right foot. The area over the fifth metatarsal head on the left is callused over/perhaps healed but I did not remove the callus to find out. He still has the wound on the left lateral midfoot requiring debridement. We have been using silver alginate to all wound areas 10/26/2018 On the left foot our intake nurse noted some purulent drainage from the inferior wound [currently the only one that is not callused over] On the right foot even though he is in total contact cast a considerable amount of thick black callus and surface eschar. On this side we have been using silver alginate under a total contact cast. he remains on ciprofloxacin as prescribed by Dr. [name] 11/02/2018; culture from last week which is done of the probing area on the left midfoot wound showed MRSA "few". I am going to need to contact Dr. [name] which I will do today I think he is going to need IV antibiotics again. The area on the left foot which was callused on the side is clearly separating today all of this was removed a copious amounts of callus and necrotic subcutaneous tissue. The area on the right plantar foot actually remained healthy looking with a healthy granulated base. We are using silver alginate to all wound areas 11/09/2018; unfortunately neither 1 of the patient's wounds areas looks at all satisfactory. On the left he has considerable necrotic debris from the plantar wound laterally over the foot. I removed copious amounts of  material including callus skin and subcutaneous tissue. On the right foot he arrives with undermining and frankly purulent drainage. Specimen obtained for culture and debridement of the callused skin and subcutaneous tissue from around the circumference. Because of this I cannot put him back in a total contact cast but to be truthful we are really unfortunately not making a lot of progress I did put in a secure text message to Dr. [name] wondering about the MRSA on the left foot. He suggested vancomycin although this is not started. I was left wondering if he expected me to send this into dialysis. Has we have more purulence on the right side wait for that result before calling dialysis 11/16/2018; I called dialysis earlier this week to get the vancomycin started. I think he got the first dose on Tuesday. The patient tells me that he fell earlier this week twisting his left foot and ankle and he is swelling. He continues to not look well. He had blood cultures done at dialysis on Tuesday he is not been informed of the results. 12/07/2018; the patient was admitted to hospital from 11/22/2018 through 12/02/2018. 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 In the meantime being off the right foot this is closed over. He follows up with Dr. 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 Subsequent to this he actually had multiple falls in fact he tells me to following which calls the wound to be his apparently. He has been seeing Dr. [name] and his physician assistant in the interim. They actually did want him to come back for reevaluation to see if there's anything we can do for me wound care perspective the help this area to heal more appropriately. The patient states that he does not have a tremendous amount of pain which is good news. No fevers, chills, nausea, or vomiting noted  at this time. We have gotten approval from Dr. [name] office had [place] for [place] to treat the patient for his stop wound. This is due to the fact that the patient was in the 90 day postop global. 1/21; the patient does not have an open wound on the right foot although he does have some pressure areas that will need to be padded when he is transferring. The dehisced surgical wound looks clean although there is some undermining of 1.5 cm superiorly. 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 This appears healthy but there is undermining. TheraSkin #1 2/11; TheraSkin #2 2/25; TheraSkin #3. Wound is measuring smaller 3/10; TheraSkin #4 wound is measuring smaller 3/24; TheraSkin #5 wound is measuring smaller and looks healthy. 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 He has no open area on the right foot although he does have some callused area over the original third metatarsal head wound. The third metatarsal is also subluxed on the right foot. I have warned him today that he cannot consider wearing a prosthesis for at least a month but he can go for measurements. He is going to need to keep the right foot padded is much as possible in his diabetic shoe indefinitely READMISSION 06/12/2019 [name] is a type II diabetic on dialysis. He has been in this clinic multiple times with wounds on his bilateral lower extremities. 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 He also has a history of wounds on his right foot but tells me that everything is going well here. He actually came in here with his prosthesis walking with a cane. We were all really quite gratified to see this He states over the last several weeks he has had a painful area at the tip of the left third finger. His dialysis  shunt in his is in the left upper arm and this particularly hurts during dialysis when his fingers get numb and there is a lot of pain in the tip of his left third finger. I believe he is seen vascular surgery. He had a Doppler done to evaluate for a dialysis steal syndrome. He is going for a banding procedure 1 week from today towards the left AV fistula. I think if that is unsuccessful they will be looking at creating a new shunt. He had a course of doxycycline when he which he finished about a week ago. He has been using Bactroban to the finger 6/25; the patient had his banding procedure  and things seem to be going better. He is not having pain in his hand at dialysis. The area on the tip of his finger seems about closed. He did however have a paronychia I the last time I saw him. I have been advising him to use topical Bactroban washing the finger. Culture I did last time showed a few staph epidermidis which I was willing to dismiss as superficial skin contaminant. He arrives today with the finger wound looking better however the paronychia a seems worse 7/9; 2-week follow-up. He does not have an open wound on the tip of his third left finger. I thought he had an initial ischemic wound on the tip of the finger as well as a paronychia a medially. All of this appears to be closed. 8/6-Patient returns after 3 weeks for follow-up and has a right second met head plantar callus with a significant area of maceration hiding an ulcer ABI repeated today on right - 1.26 8/13-Patient returns at 1 week, the right second met head plantar wound appears to be slightly better, it is surrounded by the callus area we are using silver alginate 8/20; the patient came back to clinic 2 weeks ago with a wound on the right second metatarsal head. He apparently told me he developed callus in this area but also went away from his custom shoes to a pair of running shoes. Using silver alginate. He of course has the left  BKA and prosthesis on the left side. There might be much we can do to offload this although the patient tells me he has a wheelchair that he is using to try and stay off the wound is much as possible 8/27; right second metatarsal head. Still small punched out area with thick callus and subcutaneous tissue around the wound. We have been using silver collagen. This is always been an issue with this man's wound especially on this foot 9/3; right second met head. Still the same punched-out area with thick callus and subcutaneous tissue around the wound removing the circumference demonstrates repetitively undermining area. I have removed all the subcutaneous tissue associated with this. We have been using silver collagen 9/15; right second metatarsal head. Same punched-out thick callus and subcutaneous tissue around the wound area. Still requiring debridement we have been using silver collagen I changed back to silver alginate X-ray last time showed no evidence of osteomyelitis. Culture showed Klebsiella and he was started and Keflex apparently just 4 days ago 9/24; right second metatarsal head. He is completed the antibiotics I gave him for 10 days. Same punched-out thick callus and subcutaneous tissue around the area. Still requiring debridement. We have been using silver alginate. The area itself looks swollen and this could be just [place] that comes on this area with walking on a prosthesis on the left however I think he needs an MRI and I am going to order that today. There is not an option to offload this further 10/1; right second plantar metatarsal head. MRI booked for October 6. Generally looking better today using silver alginate 10/8; right second plantar metatarsal head. MRI that was done on 10/6 was negative for osteomyelitis noted prior amputations of the second and fourth toes at the MTP. Prior amputation of the third toe to the base of the middle phalanx. Prior amputation of the  fifth toe to the head of the metatarsal. 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 He does not recall about hearing about  this. He did not have any pain but then again he has reduced sensation from diabetic neuropathy 10/15; right second plantar metatarsal head. Still in the same condition callus nonviable subcutaneous tissue which cleans up nicely with debridement but reforms by the next week. The patient tells me that he is offloading this is much as he can including taking his wheelchair to dialysis. We have been using silver alginate, changed to silver collagen today Electronic Signature(s) Signed: 10/11/2019 5:35:18 PM By: Linton Ham MD Entered By: Linton Ham on 10/11/2019 15:36:26 -------------------------------------------------------------------------------- Physical Exam Details Patient Name: Date of Service: [name], [name] 10/11/2019 1:45 PM Medical Record [id number] Patient Account Number: [id number] Date of Birth/Sex: Treating RN: [birth year] (68 y.o. [name] Primary Care Provider: [name] Other Clinician: Referring Provider: Treating Provider/Extender:Robson, Jeronimo Greaves, Buena Irish in Treatment: 17 Constitutional Sitting or standing Blood Pressure is within target range for patient.. Pulse regular and within target range for patient.Marland Kitchen Respirations regular, non-labored and within target range.. Temperature is normal and within the target range for the patient.Marland Kitchen Appears in no distress. Notes Wound exam; extensive debridement using a #5 curette removing callus subcutaneous tissue. Once again I am able to get to a clean surface albeit with some depth there is no evidence of infection. Electronic Signature(s) Signed: 10/11/2019 5:35:18 PM By: Linton Ham MD Entered By: Linton Ham on 10/11/2019  15:37:22 -------------------------------------------------------------------------------- Physician Orders Details Patient Name: Date of Service: [name], [name] 10/11/2019 1:45 PM Medical Record [id number] Patient Account Number: [id number] Date of Birth/Sex: Treating RN: 26-Apr-1951 (68 y.o. 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 Dressing Change Frequency Wound #41 Right Metatarsal head second Change Dressing every other day. Skin Barriers/Peri-Wound Care Barrier cream - as needed for maceration, wetness, or redness. Wound Cleansing May shower and wash wound with soap and water. Primary Wound Dressing Wound #41 Right Metatarsal head second Silver Collagen - moisten with hydrogel ([place] jelly) or saline Secondary Dressing Wound #41 Right Metatarsal head second Kerlix/Rolled Gauze Dry Gauze Other: - felt to periwound and place one below wound. lightly wrap coban to hold dressing in place. felt / pad wound Edema Control Avoid standing for long periods of time Elevate legs to the level of the heart or above for 30 minutes daily and/or when sitting, a frequency of: - throughout the day. Off-Loading Other: - felt to periwound and place one below wound. Additional Orders / Instructions Other: - minimize walking and standing on foot to aid in wound healing and callus buildup. Electronic Signature(s) Signed: 10/11/2019 5:35:18 PM By: Linton Ham MD Signed: 10/12/2019 6:00:55 PM By: Levan Hurst RN,  BSN Entered By: Levan Hurst on 10/11/2019 15:08:01 -------------------------------------------------------------------------------- Problem List Details Patient Name: Date of Service: [name], [name] 10/11/2019 1:45 PM Medical Record [id number] Patient Account Number: [id number] Date of Birth/Sex: Treating RN: [birth year] (68 y.o. 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, 06/12/2019 No Yes other extremity Inactive Problems ICD-10 Code Description Active Date Inactive Date L03.114 Cellulitis of left upper limb 06/12/2019 06/12/2019 L98.498 Non-pressure chronic ulcer of skin of other sites with other 06/12/2019 06/12/2019 specified severity S61.209D Unspecified open wound of unspecified finger without damage 06/12/2019 06/12/2019 to nail, subsequent encounter Resolved Problems Electronic Signature(s) Signed: 10/11/2019 5:35:18 PM By: Linton Ham MD Entered By: Linton Ham on 10/11/2019 15:35:14 -------------------------------------------------------------------------------- Progress Note Details Patient Name: Date of Service: [name], [name] 10/11/2019 1:45 PM Medical Record [id number] Patient Account Number: [id number] Date of Birth/Sex: Treating RN: 1951/06/14 (68 y.o. [name] Primary Care Provider: [name] Other Clinician: Referring Provider: Treating Provider/Extender:Robson, Jeronimo Greaves, Buena Irish in Treatment: 17 Subjective History of Present Illness (HPI) The following HPI  elements were documented for the patient's wound: Location: Patient presents with a wound to bilateral feet. Quality: Patient reports experiencing essentially no pain. Severity: Mildly severe wound with no evidence of infection Duration: Patient has had the wound for greater than 2 weeks prior to presenting for treatment The patient is a pleasant 68 yrs old bm here for evaluation of ulcers on the plantar aspect of both feet. 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 He has a left arm graft for access. He has been trying to stay off his feet for weeks but does not seem to have any improvement in the wound. He has been seeing someone at the foot center and was referred to the Wound Care center for further evaluation. 12/30/15 the patient has 3 wounds one over the right first metatarsal head and 2 on the left foot at the left fifth and lleft first metatarsal head. All of these look relatively similar. The one over the left fifth probe to bone I could not prove that any of the others did. I note his MRI in November that did not show osteomyelitis. His peripheral pulses seem robust. All of these underwent surgical debridement to remove callus nonviable skin and subcutaneous tissue 01/06/16; the patient had his sutures removed from the right fifth ray amputation. There may be a small open part of this superiorly but otherwise the incision looks good. Areas over his right first, left first and left fifth metatarsal head all underwent surgical debridement as a varying degree of callus, skin and nonviable subcutaneous tissue. The area that is most worrisome is the right fifth metatarsal head which has a wound probes precariously close to bone. There is no purulent drainage or erythema 01/13/16; I'm not exactly sure of the status of the right fifth ray amputation site however he follows with Dr. [name] later this week. The area over his right first plantar  metatarsal head, left first and fifth plantar metatarsal head are all in the same status. Thick circumferential callus, nonviable subcutaneous tissue. Culture of the left fifth did not culture last week 01/20/16; all of the patient's wounds appear and roughly the same state although his amputation site on the right lateral foot looks better. His wounds over the right first, left first and left fifth metatarsal heads all underwent difficult surgical debridement removing circumferential callus nonviable skin and subcutaneous tissue. There is no overt evidence of infection in these areas. MRI at the end of December of the left foot did not show osteomyelitis, right foot showed osteomyelitis of the right fifth digit he is now  status post amputation. 01/27/16 the patient's wounds over his plantar first and fifth metatarsal heads on the left all appear better having been started on a total contact cast last week. They were debridement of circumferential callus and nonviable subcutaneous tissue as was the wound over the first metatarsal head. His surgical incision on the right also had some light surface debridement done. 03/23/2016 -- the patient was doing really well and most of his wounds had almost completely healed but now he came back today with a history of having a discharge from the area of his right foot on the plantar aspect and also between his first and second toe. He also has had some discharge from the left foot. Addendum: I spoke to the PA [name] at the dialysis center whose fax number is [phone]. We discussed the infection the patient has and she will put the patient on vancomycin and Fortaz until the final culture report is back. We will fax this as soon as available. 03/30/2016 -- left foot x-ray IMPRESSION:No definitive osteomyelitis noted. X-ray of the right foot -- IMPRESSION: 1. Soft tissue swelling. Prior amputation right fifth digit. No acute or focal bony  abnormality identified. If osteomyelitis remains a clinical concern, MRI can be obtained. 2. Peripheral vascular disease. His culture reports have grown an MSSA -- and we will fax this report to his hemodialysis center. He was called in a prescription of oral doxycycline but I have told her not to fill this in as he is already on IV antibiotics. 04/06/2016 -- a few days ago, I spoke to the hemodialysis center nurse who had stopped the IV antibiotics and he was given a prescription for doxycycline 100 mg by mouth twice a day for a week and he is on this at the present time. 05/11/2016 -- he has recently seen his PCP this week and his hemoglobin A1c was 7. He is working on his paperwork to get his orthotic shoes. 05/25/2016 -- -- x-ray of the left foot IMPRESSION:No acute bony abnormality. No radiographic changes of acute osteomyelitis. No change since prior study. X-ray of the right foot -- IMPRESSION: Postsurgical changes are seen involving the fifth toe. No evidence of acute osteomyelitis. he developed a large blister on the medial part of his right foot and this opened out and drain fluid. 06/01/2016 -- he has his MRI to be done this afternoon. 06/15/2016 -- MRI of the left forefoot without contrast shows 2 separate regions of cutaneous and subcutaneous edema and possible ulceration and blistering along the ball of the foot. No obvious osteomyelitis identified. MRI of the right foot showed cutaneous and subcutaneous thickening plantar to the first digit sesamoid with an ulcer crater but no underlying osteomyelitis is identified. 06/22/2016 -- the right foot plantar ulcer has been draining a lot of seropurulent material for the last few days. 06/30/2016 -- spoke to the dialysis center and I believe I spoke to [place] a PA at the center who discussed with me and agreed to putting [name] on vancomycin until his cultures arrive. On review of his culture report no WBCs were seen or no organisms  were seen and the culture was reincubated for better growth. The final report is back and there were no predominant growth including Streptococcus or Staphylococcus. Clinically though he has a lot of drainage from both wounds a lot of undermining and there is further blebs on the left foot towards the interspace between his first and second toe. 08/10/2016 -- the culture from  the right foot showed normal skin flora and there was no Staphylococcus aureus was group A streptococcus isolated. 09/21/2016 -- -- MRI of the right foot was done on 09/13/2016 - IMPRESSION: Findings most consistent with acute osteomyelitis throughout the great toe, sesamoid bones and plantar aspect of the head of the first metatarsal. Fluid in the sheath of the flexor tendon of the great toe could be sympathetic but is worrisome for septic tenosynovitis. First MTP joint effusion worrisome for septic joint. He was admitted to the hospital on 09/11/2016 and treated for a fever with vancomycin and cefepime. He was seen by Dr. Bobby Rumpf of infectious disease who recommended 6 weeks treatment with vancomycin and ceftazidime with his hemodialysis and to continue to see as in the wound clinic. Vascular consult was pending. The patient was discharged home on 09/14/2016 and he would continue with IV antibiotics for 6 weeks. 09/28/16 wound appears reasonably healthy. Continuing with total contact cast 10/05/16 wound is smaller and looking healthy. Continue with total contact cast. He continues on IV antibiotics 10/12/2016 -- he has developed a new wound on the dorsal aspect of his left big toe and this is a superficial injury with no surrounding cellulitis. He has completed 30 days of IV antibiotics and is now ready to start his hyperbaric oxygen therapy as per his insurance company's recommendation. 10/19/2016 -- after the cast was removed on the right side he has got good resolution of his ulceration on the right plantar  foot. he has a new wound on the left plantar foot in the region of his fourth metatarsal and this will need sharp debridement. 10/26/2016 -- Xray of the right foot complete: IMPRESSION: Changes consistent with osteomyelitis involving the head of the first metatarsal and base of the first proximal phalanx. The sesamoid bones are also likely involved given their positioning. 11/02/2016 -- there still awaiting insurance clearance for his hyperbaric oxygen therapy and hopefully he will begin treatment soon. 11/09/2016 -- started with hyperbaric oxygen therapy and had some barotrauma to the right ear and this was seen by ENT who was prescribed Afrin drops and would probably continue with HBO and he is scheduled for myringotomy tubes on Friday 11/16/2016 -- he had his myringotomy tubes placed on Friday and has been doing much better after that with some fluid draining out after hyperbaric treatment today. The pain was much better. 11/30/2016 -- over the last 2 days he noticed a swelling and change of color of his right second toe and this had been draining minimal fluid. 12/07/2016 -- x-ray of the right foot -- IMPRESSION:1. Progressive ulceration at the distal aspect of the second digit with significant soft tissue swelling and osseous changes in the distal phalanx compatible with osteomyelitis. 2. Chronic osteomyelitis at the first MTP joint. 3. Ulcerations at the second and third toes as well without definite osseous changes. Osteomyelitis is not excluded. 4. Fifth digit amputation. On 12/01/2016 oo I spoke to Dr. Bobby Rumpf, the infectious disease specialist, who kindly agreed to treat this with IV antibiotics and he would call in the order to the dialysis center and this has been discussed in detail with the patient who will make the appropriate arrangements. The patient will also book an appointment as soon as possible to see Dr. [name] in the office. Of note the patient has not been on  antibiotics this entire week as the dialysis center did not receive any orders from Dr. [name]. I got in touch with Dr. [name] who tells  me the patient has an appointment to see him this coming Wednesday. 12/14/2016 -- the patient is on ceftazidime and vancomycin during his dialysis, and I understand this was put on by his nephrologist Dr. Raliegh Ip possibly after speaking with infectious disease Dr. [name] 12/23/16; patient was on my schedule today for a wound evaluation as he had difficulties with his schedule earlier this week. I note that he is on vancomycin and ceftazidine at dialysis. In spite of this he arrives today with a new wound on the base of the right second toe this easily probes to bone. The known wound at the tip of the second toe With this area. He also has a superficial area on the medial aspect of the third toe on the side of the DIP. This does not appear to have much depth. The area on the plantar left foot is a deep area but did not probe the bone 12/28/16 -- he has brought in some lab work and the most recent labs done showed a hemoglobin of 12.1 hematocrit of 36.3, neutrophils of 51% WBC count of 5.6, BN of 53, albumin of 4.1 globulin of 3.7, vancomycin 13 g per mL. 01/04/2017 -- he saw Dr. [name] who has recommended a amputation of the right second toe and he is awaiting this date. He sees Dr. Bobby Rumpf of infectious disease tomorrow. The bone culture taken on 12/28/2016 had no growth in 2 days. 01/11/2017 -- was seen by Dr. Bobby Rumpf regarding the management and has recommended a eval by vascular surgeons. He recommended to continue the antibiotics during his hemodialysis. Xray of the left foot -- IMPRESSION: No acute fracture, dislocation, or osseous erosion identified. 01/18/2017 -- he has his vascular workup later today and he is going to have his right foot second toe amputation this coming Friday by Dr. [name]. We have also put in for a another 30 treatments  with hyperbaric oxygen therapy 01/25/2017 -- I have reviewed Dr. Donnetta Hutching is vascular report from last week where he reviewed him and thought his vascular function was good enough to heal his amputation site and no further tests were recommended. He also had his orthopedic related to surgery which is still pending the notes and we will review these next week. 02/01/2017 -- the operative remote of Dr. 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 02/08/2017 --he was seen by Dr. [name] on 02/03/2017 -- patient is on [place]. after a thorough review he had recommended to continue with antibiotics during hemodialysis. The patient was seen by Dr. [name], but I do not find any follow-up note on the electronic medical record. he has removed the dressing over the right foot to remove the sutures and asked him to see him back in a month's time 02/22/2017 -- patient has been febrile and has been having symptoms of the upper respiratory tract infection but has not been checked for the flu and it's been over 4 days now. He says he is feeling better today. Some drainage between his left first and second toe and this needed to be looked at 03/08/2017 -- was seen by Dr. Bobby Rumpf on 03/07/2017 after review he will stop his antibiotics  and see him back in a month to see if he is feeling well. 03/15/2017 -- he has completed his course of hyperbaric oxygen therapy and is doing fine with his health otherwise. 03/22/2017 -- his nutritionist at the dialysis center has recommended a protein supplement to help build his collagen and we will prescribe this  for him when he has the details 05/03/2017 -- he recently noticed the area on the plantar aspect of his fifth metatarsal head which opened out and had minimal drainage. 05/10/2017 -- -- x-ray of the left foot -- IMPRESSION:1. No convincing conventional radiographic evidence of active osteomyelitis.2. Active soft tissue ulceration at the tip of the fifth digit, and at the lateral aspect of the foot adjacent the base of the fifth metatarsal. 3. Surgical changes of prior fourth toe amputation. 4. 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 5. Small vessel atherosclerotic vascular calcifications. 6. 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 ======= Readmission after 5 weeks: 06/15/2017 -- the patient returns after 5 weeks having had an MRI done on 05/17/2017 MRI of the left foot without contrast showed soft tissue ulcer overlying the base of the fifth metatarsal. 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 No drainable fluid collection to suggest an abscess. He was in hospital between 05/16/2017 and 05/25/2017 -- and on discharge was asked to follow-up with Dr. [name] and Dr. [name]. He was treated 2 weeks post discharge with vancomycin plus ceftriaxone with hemodialysis and oral metronidazole 500 mg 3 times a day. 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 last hemoglobin A1c was 6.7% He had a postoperative MR of the foot on 05/23/2017 -- which showed no new areas of cortical bone loss, edema or soft tissue ulceration to suggest osteomyelitis. the patient has completed his course of IV antibiotics during dialysis and is to see the infectious disease doctor tomorrow. Dr. [name] had seen him after suture removal and asked him to keep the wound with a dry dressing 06/21/2017 -- he was seen by Dr.  Bobby Rumpf of infectious disease on 06/16/2017 -- he stopped his Flagyl and will see him back in 6 weeks. He was asked to follow-up with Dr. [name] and with the wound clinic clinic. 07/05/17; bone biopsy from last time showed acute osteomyelitis. This is from the reminiscent in the left fifth metatarsal. Culture result is apparently still pending [holding for anaerobe}. From my understanding in this case this is been a progressive necrotic wound which is deteriorated markedly over the last 3 weeks since he returned here. He now has a large area of exposed bone which was biopsied and cultured last week. Dr. Graylon Good has put him on vancomycin and Fortaz during his hemodialysis and Flagyl orally. He is to see Dr. [name] next week 07/19/2017 -- the patient was reviewed by Dr. [name] of orthopedics who reviewed the case in detail and agreed with the plan to continue with IV antibiotics, aggressive wound care and hyperbaric oxygen therapy. He would see him back in 3 weeks' time 08/09/2017 -- saw Dr. Bobby Rumpf on 08/03/2017 -he was restarted on his antibiotics for 6 weeks which included vanco, ceftaz and Flagyl. he recommended continuing his antibiotics for 6 weeks and reevaluate his completion date. He would continue with wound care and hyperbaric oxygen therapy. 08/16/2017 -- I understand he will be completing his  6 weeks of antibiotics sometime later this week. 09/19/2017 -- he has been without his wound VAC for the last week due to lack of supplies. He has been packing his wound with silver alginate. 10/04/2017 -- he has an appointment with Dr. [name] tomorrow and hence we will not apply the wound VAC after his dressing changes today. 10/11/2017 -- he was seen by Dr. Bobby Rumpf on 10/05/2017, noted that the patient was currently off antibiotics, and after thorough review he recommended he follow-up with the wound care as per plan and no further antibiotics at this point. 11/29/17;  patient is arrived for the wound on his left lateral foot.Marland Kitchen He is completed IV antibiotics and 2 rounds of hyperbaric oxygen for treatment of underlying osteomyelitis. He arrives today with a surface on most of the wound area however our intake nurse noted drainage from the superior aspect. This was brought to my attention. 12/13/2017 -- the right plantar foot had a large bleb and once the bleb was opened out a large callus and subcutaneous debris was removed and he has a plantar ulcer near the fourth metatarsal head 12/28/17 on evaluation today patient appears to be doing acutely worse in regard to his left foot. The wound which has been appearing to do better as now open up more deeply there is bone palpable at the base of the wound unfortunately. He tells me that this feels "like it did when he had osteomyelitis previously" he also noted that his second toe on the left foot appears to be doing worse and is swollen there does appear to be some fluid collected underneath. His right foot plantar ulcer appears to be doing somewhat better at this point and there really is no complication at the site currently. No fevers, chills, nausea, or vomiting noted at this time. Patient states that he normally has no pain at this site however. Today he is having significant pain. 01/03/18; culture done last week showed methicillin sensitive staph aureus and group B strep. He was on Septra however he arrives today with a fever of 101. He has a functional dialysis shunt in his left arm but has had no pain here. No cough. He still makes some urine no dysuria he did have abdominal pain nausea and diarrhea over the weekend but is not had any diarrhea since yesterday. Otherwise he has no specific complaints 01/05/18; the patient returns today in follow-up for his presentation from 1/8. At that point he was febrile. I gave him some Levaquin adjusted for his dialysis status. He tells me the fever broke that night and it is  not likely that this was actually a wound infection. He is gone on to have an MRI of the left foot; this showed interval development of an abnormal signal from the base of the third and fourth metatarsal and in the cuboid reminiscent consistent with osteomyelitis. There is no mention of the left second toe I think which was a concern when it was ordered. The patient is taking his Levaquin 01/10/18; the patient has completed his antibiotics today. The area on the left lateral foot is smaller but it still probes easily to bone. He has underlying osteomyelitis here and sees Dr. Megan Salon of infectious disease this week ooThe area on the right third plantar metatarsal head still requires debridement not much change in dimensions [facility] has a new wound on the medial tip of his right third toe again this probes to bone. He only noticed this 2 days ago 01/17/18;  the patient saw Dr. [name] last week and he is back on Vanco and Fortaz at dialysis. 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 We are previously seeing him for a wound also on the base of the fourth med head on the right. X- ray of this area did not show osteomyelitis in relation to a new wound on the tip of his right third toe. Again this probes to bone. Finally his second toe on the left which didn't really show anything on the MRI at least no report appears to have a separated cutaneous area which I think is going to come right off the tip of his toe and leave exposed bone. Whether this is infectious or ischemic I am not clear Culture I did from the third toe last week which was his new wound was negative. Plain x-ray on the right foot did not show osteomyelitis in the toes 01/24/18; the patient is going went to see Dr. [name]. He is going to have a amputation of the left second and right third toe although paradoxically the left  second toe looks better than last week. We have treating a deep probing wound on the left lateral foot and the area over the third metatarsal head on the right foot. 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 Also a debridement including bone of the left lateral foot and a closure which is still sutured. This is a bit surprising. Also apparent debridement of the base of the right third toe wound. 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 I'm not sure exactly where we are in the frame of things here. The wound on the left lateral foot is still sutured also the amputation sites of the left second and right third toe. He is been using Xeroform to the sutured areas silver alginate to the right foot 02/14/18; he continues on Rocephin at dialysis for osteomyelitis. I still don't have a good sense of where we are in the treatment duration. The wounds on the left lateral foot had the sutures removed and this clearly still probes deeply. We debrided the area with a #5 curet. We'll use silver alginate to the wound oothe area over the right third met head also required debridement of callus skin and subcutaneous tissue and necrotic debris over the wound surface. This tunnels superiorly but I did not unroofed this today. ooThe areas on the tip of his toes have a crusted surface eschar I did not debridement this either. 02/21/18; he continues on Rocephin at dialysis for osteomyelitis. I don't have a good sense of where we are and the treatment duration. The wounds on the left lateral foot is callused over on presentation but requires debridement. The area on the right third med head plantar aspect actually is measuring smaller 02/28/18;patient is on Vanco and Fortaz at dialysis, I'm  not sure why I thought this was Rocephin above unless it is been changed. I don't have a good sense of time frame here. Using silver collagen to the area on the left lateral foot and right third med head plantar foot 03/07/18; patient is completing bank and Fortaz at dialysis  soon. He is using Silver collagen to the area on the left lateral foot and the right third metatarsal head. He has a smaller superficial wound distal to the left lateral foot wound. 03/14/18-he is here in follow-up evaluation for multiple ulcerations to his bilateral feet. He presents with a new ulceration to the plantar aspect of the left foot underneath the blister, this was deroofed to reveal a partial thickness ulcer. He is voicing no complaints or concerns, tolerated dialysis yesterday. We will continue with same treatment plan and he will follow-up next week 03/21/18; the patient has a area on the lateral left foot which still has a small probing area. The overall surface area of the wound is better. He presented with a new ulceration on the left foot plantar fifth metatarsal head last week. The area on the right third metatarsal head appears smaller.using silver alginate to all wound areas. The patient is changing the dressing himself. He is using a Darco forefoot offloading are on the right and a healing sandal on the left 03/28/18; original wound left lateral foot. Still nowhere close to looking like a heeling surface oosecondary wound on the left lateral foot at the base of his question fifth metatarsal which was new last week ooRight third metatarsal head. ooWe have been using silver alginate all wounds 04/04/18; the original wound on the left lateral foot again heavy callus surrounding thick nonviable subcutaneous tissue all requiring debridement. The new wound from 2 weeks ago just near this at the base of the fifth metatarsal head still looks about the same ooUnfortunately there is been deterioration in the  third medical head wound on the right which now probes to bone. I must say this was a superficial wound at one point in time and I really don't have a good frame of reference year. I'll have to go back to look through records about what we know about the right foot. 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 He is also been to see Dr. [name]. Somewhere in here as somebody is ordered a VASCULAR evaluation which was done on 03/22/18. On the right is anterior tibial artery was monophasic triphasic at the posterior tibial artery. On the left anterior tibial artery monophasic posterior tibial artery monophasic. ABI in the right was 1.43 on the left 1.21. TBIs on the right at 0.41 and on the left at 0.32. A vascular consult was recommended and I think has been arranged. 04/11/18; Mr. Schult has not had an MRI of the right foot since 2017. Recent x-ray of the right foot done in January and February was negative. However he has had a major deterioration in the wound over the third met head. He is completed antibiotics last week at dialysis [place]. I think he is going to see vascular in follow-up. The areas on the left lateral foot and left plantar fifth metatarsal head both look satisfactory. I debrided both of these areas although the tissue here looks good. The area on the left lateral foot once probe to bone it certainly does not do that now 04/18/18; MRI of the right foot is on Thursday. He has what looks to be serosanguineous purulent drainage coming out of the wound over the right third metatarsal head today. He completed antibiotics bank and Fortaz 2 weeks ago at dialysis. He has a vascular follow-up with regards to his arterial insufficiency although I don't exactly see  when that is booked. The areas on the left foot including the lateral left foot and the plantar left fifth metatarsal head  look about the same. 04/25/18-He is here in follow-up evaluation for bilateral foot ulcers. MRI obtained was negative for osteomyelitis. Wound culture was negative. We will continue with same treatment plan he'll follow-up next week 05/02/18; the right plantar foot wound over the third metatarsal head actually looks better than when I last saw this. His MRI was negative for osteomyelitis wound culture was negative. ooOn the left plantar foot both wounds on the plantar fifth metatarsal head and on the lateral foot both are covered in a very hard circumferential callus. 05/09/18; right plantar foot wound over the third metatarsal head stable from last week. ooLeft plantar foot wound over the fifth metatarsal head also stable but with callus around both wound areas ooThe area on the left lateral foot had thick callus over a surface and I had some thoughts about leaving this intact however it felt boggy. ooWe've been using silver alginate all wounds 05/16/18; since the patient was last here he was hospitalized from 5/15 through 5/19. 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 This grew MRSA. He was placed on vancomycin.MRI of the left foot suggested "progressive" osteomyelitis and bone destruction of the cuboid and the base of the fifth metatarsal. Stable erosive changes of the base of the second metatarsal. I'm wondering if they're aware that he had surgery and debridement in the area of the underlying bone previously by Dr. [name]. In any case, He was also revascularized with an angioplasty and stenting of the left tibial peroneal trunk and angioplasty of the left posterior tibial artery. He is on vancomycin at dialysis. He has a 2 week follow-up with infectious disease. He has vascular surgery follow-up. He was started on Plavix. 05/23/18; the patient's wound on the lateral left foot at the  level of the fifth metatarsal head and the plantar wound on the plantar fifth metatarsal head both look better. The area on the right third metatarsal head still has depth and undermining. We've been using silver alginate. He is on vancomycin. He has been revascularized on the left 05/30/18; he continues on vancomycin at dialysis. Revascularized on the left. The area on the left lateral foot is just about closed. Unfortunately the area over the plantar fifth metatarsal head undermining superiorly as does the area over the right third metatarsal head. Both of these significantly deteriorated from last week. He has appointments next week with infectious disease and orthopedics I delayed putting on a cast on the left until those appointments which therefore we made we'll bring him in on Friday the 14th with the idea of a cast on the left foot 06/06/18; he continues on vancomycin at dialysis. Revascularized on the left. He arrives today with a ballotable swelling just above the area on the left lateral foot where his previous wound was. He has no pain but he is reasonably insensate. The difficult areas on the plantar left fifth metatarsal head looks stable whereas the area on the right third plantar metatarsal head about the same as last week there is undermining here although I did not unroofed this today. He required a considerable debridement of the swelling on the left lateral foot area and I unroofed an abscess with a copious amount of brown purulent material which I obtained for culture. I  don't believe during his recent hospitalization he had any further imaging although I need to review this. Previous cultures from this area done in this clinic showed MRSA, I would be surprised if this is not what this is currently even though he is on vancomycin. 06/13/18; he continues on vancomycin at dialysis however he finishes this on Sunday and then graduates the doxycycline previously prescribed by Dr.  [name]. The abscess site on the lateral foot that I unroofed last time grew moderate amounts of methicillin-resistant staph aureus that is both vancomycin and tetracycline sensitive so we should be okay from that regard. He arrives today in clinic with a connection between the abscess site and the area on the lateral foot. We've been using silver alginate to all his wound areas. The right third metatarsal head wound is measuring smaller. Using silver alginate on both wound areas 06/20/18; transitioning to doxycycline prescribed by Dr. [name]. The abscess site on the lateral foot that I unroofed 2 weeks ago grew MRSA. That area has largely closed down although the lateral part of his wound on the fifth metatarsal head still probes to bone. I suspect all of this was connected. ooOn the right third metatarsal head smaller looking wound but surrounded by nonviable tissue that once again requires debridement using silver alginate on both areas 06/27/18; on doxycycline 100 twice a day. The abscess on the lateral foot has closed down. Although he still has the wound on the left fifth metatarsal head that extends towards the lateral part of the foot. The most lateral part of this wound probes to bone ooRight third metatarsal head still a deep probing wound with undermining. Nevertheless I elected not to debridement this this week ooIf everything is copious static next week on the left I'm going to attempt a total contact cast 07/04/18; he is tolerating his doxycycline. The abscess on the left lateral foot is closed down although there is still a deep wound here that probes to bone. We will use silver collagen under the total contact cast Silver collagen to the deep wound over the right third metatarsal head is well 07/11/18; he is tolerating doxycycline. The left lateral fifth plantar metatarsal head still probes deeply but I could not probe any bone. We've been using silver collagen and this will be the  second week under the total contact cast ooSilver collagen to deep wound over the right third metatarsal head as well 07/18/18; he is still taking doxycycline. The left lateral fifth plantar metatarsal this week probes deeply with a large amount of exposed bone. Quite a deterioration. He required extensive debridement. Specimens of bone for pathology and CNS obtained ooAlso considerable debridement on the right third metatarsal head 07/25/18; bone for pathology last week from the lateral left foot showed osteomyelitis. CandS of the bone Enterobacter. And Klebsiella. He is now on ciprofloxacin and the doxycycline is stopped [Dr. [name] of infectious disease] area He has an appointment with Dr. [name] tomorrow I'm going to leave the total contact cast off for this week. May wish to try to reapply that next week or the week after depending on the wound bed looks. I'm not sure if there is an operative option here, previously is followed with Dr. [name] 08/01/18 on evaluation today patient presents for reevaluation. He has been seen by infectious disease, Dr. [name], and he has placed the patient back on Ceftaz currently to be given to him at dialysis three times a week. Subsequently the patient has a wound on the  right foot and is the left foot where he currently has osteomyelitis. Fortunately there does not appear to be the evidence of infection at this point in time. Overall the patient has been tolerating the dressing changes without complication. We are no longer due lives in the cast of left foot secondary to the infection obviously. 08/10/18 on evaluation today patient appears to be doing rather well all things considered in regard to his left plantar foot ulcer. He does have a significant callous on the lateral portion of his left foot he wonders if I can help clean that away to some degree today. Fortunately he is having no evidence of infection at this time which is good news. No fevers  chills noted. With that being said his right plantar foot actually does have a significant callous buildup around the wound opening this seems to not be doing as well as I would like at this point. I do believe that he would benefit from sharp debridement at this site. Subsequently I think a total contact cast would be helpful for the right foot as well. 08/17/18; the patient arrived today with a total contact cast on the right foot. This actually looks quite good. He had gone to see Dr. Megan Salon of infectious disease about the osteomyelitis on the left foot. I think he is on IV Fortaz at dialysis although I am not exactly sure of the rationale for the Fortaz at the time of this dictation. He also arrived in today with a swelling on the lateral left foot this is the site of his original wounds in fact when I first saw this man when he was under Dr. Ardeen Garland care I think this was the site of where the wound was located. It was not particularly tender however using a small scalpel I opened it to [place] some moderate amount of purulent drainage. 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 Last MRI was in May that showed progressive osteomyelitis and bone destruction of the cuboid and base of the fifth metatarsal. Last week he had an abscess over this same area this was removed. Culture was negative. ooWe are continuing with a total contact cast to the area on the third metatarsal head on the right and making some good progress here. The patient asked me again about renal transplant. He is not on the list because of the open wounds. 08/31/18; apparently the patient had an interruption in the [place] but he is now back on this at dialysis. Apparently there was a misunderstanding and Dr. Crissie Figures orders. Due to have the MRI of the left foot tonight. ooThe area on the right foot  continues to have callus thick skin and subcutaneous tissue around the wound edge that requires constant debridement however the wound is smaller we are using a total contact cast in this area. Alginate all wounds 09/07/18; without much surprise the MRI of his left foot showed osteomyelitis in the reminiscent of his fourth metatarsal but also the third metatarsal. She arrives in clinic today with the right foot and a total contact cast. There was purulent drainage coming out of the wound which I have cultured. Marked deterioration here with undermining widely around the wound orifice. Using pickups and a scalpel I remove callus and subcutaneous tissue from a substantial new opening. ooIn a similar fashion the area on the left lateral foot that was  blistered last week I have opened this and remove skin and subcutaneous tissue from this area to expose a obvious new wound. I think there is extension and communication between all of this on the left foot. ooThe patient is on [place] at dialysis. Culture done of the right foot. We are clearly not making progress here. ooWe have made him an appointment with Dr. [name] of orthopedics. I think an amputation of the left leg may be discussed. I don't think there is anything that can be done with foot salvage. 09/14/18; culture from the right foot last time showed a very resistant MRSA. This is resistant to doxycycline. I'm going to try to get linezolid at least for a week. He does not have a appointment with Dr. [name] yet. 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 I'll send a message to Dr. [name] of infectious disease. I want the patient to see Dr. [name] to go over the pros and cons of an amputation which I think will be a BKA. The patient had a question about whether this is curative or not. I told him that I thought it would be although spread of staph aureus infection is not unheard of. MRI  of the right foot was done at the end of April 2019 did not show osteomyelitis. The patient last saw Dr. [name] on 9/3. I'll send Dr. [name] a message. I would really like him to weigh the pros and cons of a BKA on the left otherwise he'll probably need IV antibiotics and perhaps hyperbaric oxygen again. He has not had a good response to this in the past. His MRI earlier this month showed progressive damage in the remnants of the fourth and third metatarsal 09/21/2018; sees Dr. [name] of orthopedics next week to discuss a left BKA in response to the osteomyelitis in the left foot. Using silver alginate to all wound areas. He is completing the Zyvox I did put in for him after last culture showed MRSA 09/29/2018; patient saw Dr. [name] of orthopedics discussed the osteomyelitis in the left foot third and fourth metatarsals. He did not recommend urgent surgery but certainly stated the only surgical option would be a BKA. He is communicating with Dr. [name]. The problem here is the instability of the areas on his foot which constantly generate draining abscesses. I will communicate with Dr. [name] about this. He has an appointment on 10/23 10/05/2018; patient sees Dr. [name] on 10/23. I have sent him a secure message not ordered any additional antibiotics for now. Patient continues to have 2 open areas on the left plantar foot at the fifth metatarsal head and the lateral aspect of the foot. These have not changed all that much. The area on the right third met head still has thick callus and surrounding subcutaneous tissue we have been using silver alginate 10/12/2018; patient sees Dr. [name] or colleague on 10/23. 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 The area on the right plantar foot is just about closed. The superior wound on the left fifth metatarsal head is callused over but I am not sure if this is closed. The area below  it is about the same. Considerable amount of callus on the lateral foot. We have been using silver alginate to the wounds 10/19/2018; the patient saw Dr. [name] on 10/22. He wishes to try and continue to save the left  foot. He has been given 6 weeks of oral ciprofloxacin. We continue to put a total contact cast on the right foot. The area over the fifth metatarsal head on the left is callused over/perhaps healed but I did not remove the callus to find out. He still has the wound on the left lateral midfoot requiring debridement. We have been using silver alginate to all wound areas 10/26/2018 ooOn the left foot our intake nurse noted some purulent drainage from the inferior wound [currently the only one that is not callused over] ooOn the right foot even though he is in total contact cast a considerable amount of thick black callus and surface eschar. On this side we have been using silver alginate under a total contact cast. oohe remains on ciprofloxacin as prescribed by Dr. [name] 11/02/2018; culture from last week which is done of the probing area on the left midfoot wound showed MRSA "few". I am going to need to contact Dr. [name] which I will do today I think he is going to need IV antibiotics again. The area on the left foot which was callused on the side is clearly separating today all of this was removed a copious amounts of callus and necrotic subcutaneous tissue. ooThe area on the right plantar foot actually remained healthy looking with a healthy granulated base. ooWe are using silver alginate to all wound areas 11/09/2018; unfortunately neither 1 of the patient's wounds areas looks at all satisfactory. On the left he has considerable necrotic debris from the plantar wound laterally over the foot. I removed copious amounts of material including callus skin and subcutaneous tissue. On the right foot he arrives with undermining and frankly purulent drainage. Specimen obtained  for culture and debridement of the callused skin and subcutaneous tissue from around the circumference. Because of this I cannot put him back in a total contact cast but to be truthful we are really unfortunately not making a lot of progress I did put in a secure text message to Dr. [name] wondering about the MRSA on the left foot. He suggested vancomycin although this is not started. I was left wondering if he expected me to send this into dialysis. Has we have more purulence on the right side wait for that result before calling dialysis 11/16/2018; I called dialysis earlier this week to get the vancomycin started. I think he got the first dose on Tuesday. The patient tells me that he fell earlier this week twisting his left foot and ankle and he is swelling. He continues to not look well. He had blood cultures done at dialysis on Tuesday he is not been informed of the results. 12/07/2018; the patient was admitted to hospital from 11/22/2018 through 12/02/2018. He underwent a left BKA. Apparently had staph sepsis. In the meantime being off the right foot this is closed over. He follows up with Dr. 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 Subsequent to this he actually had multiple falls in fact he tells me to following which calls the wound to be his apparently. He has been seeing Dr. [name] and his physician assistant in the interim. They actually did want him to come back for reevaluation to see if there's anything we can do for me wound care perspective the help this area to heal more appropriately. The patient states that he does not have a tremendous amount of pain which is good news.  No fevers, chills, nausea, or vomiting noted at this time. We have gotten approval from Dr. [name] office had [place] for [place] to treat the patient for his stop wound. This is due to  the fact that the patient was in the 90 day postop global. 1/21; the patient does not have an open wound on the right foot although he does have some pressure areas that will need to be padded when he is transferring. The dehisced surgical wound looks clean although there is some undermining of 1.5 cm superiorly. 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 This appears healthy but there is undermining. TheraSkin #1 2/11; TheraSkin #2 2/25; TheraSkin #3. Wound is measuring smaller 3/10; TheraSkin #4 wound is measuring smaller 3/24; TheraSkin #5 wound is measuring smaller and looks healthy. 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 He has no open area on the right foot although he does have some callused area over the original third metatarsal head wound. The third metatarsal is also subluxed on the right foot. I have warned him today that he cannot consider wearing a prosthesis for at least a month but he can go for measurements. He is going to need to keep the right foot padded is much as possible in his diabetic shoe indefinitely READMISSION 06/12/2019 Mr. Minus is a type II diabetic on dialysis. He has been in this clinic multiple times with wounds on his bilateral lower extremities. 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 He also has a history of wounds on his right foot but tells me that everything is going well here. He actually came in here with his prosthesis walking with a cane. We were all really quite gratified to see this He states over the last several weeks he has had a painful area at the tip of the left third finger. His dialysis shunt in his is in the left upper arm and this particularly hurts during dialysis when his fingers get numb and there is a lot of pain in the tip  of his left third finger. I believe he is seen vascular surgery. He had a Doppler done to evaluate for a dialysis steal syndrome. He is going for a banding procedure 1 week from today towards the left AV fistula. I think if that is unsuccessful they will be looking at creating a new shunt. He had a course of doxycycline when he which he finished about a week ago. He has been using Bactroban to the finger 6/25; the patient had his banding procedure and things seem to be going better. He is not having pain in his hand at dialysis. The area on the tip of his finger seems about closed. He did however have a paronychia I the last time I saw him. I have been advising him to use topical Bactroban washing the finger. Culture I did last time showed a few staph epidermidis which I was willing to dismiss as superficial skin contaminant. He arrives today with the finger wound looking better however the paronychia a seems worse 7/9; 2-week follow-up. He does not have an open wound on the tip of his third left finger. I thought he had an initial ischemic wound on the tip of the finger as well as a paronychia a  medially. All of this appears to be closed. 8/6-Patient returns after 3 weeks for follow-up and has a right second met head plantar callus with a significant area of maceration hiding an ulcer ABI repeated today on right - 1.26 8/13-Patient returns at 1 week, the right second met head plantar wound appears to be slightly better, it is surrounded by the callus area we are using silver alginate 8/20; the patient came back to clinic 2 weeks ago with a wound on the right second metatarsal head. He apparently told me he developed callus in this area but also went away from his custom shoes to a pair of running shoes. Using silver alginate. 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 There might be much we can do to offload this although the patient tells me he has a wheelchair that he is  using to try and stay off the wound is much as possible 8/27; right second metatarsal head. Still small punched out area with thick callus and subcutaneous tissue around the wound. We have been using silver collagen. This is always been an issue with this man's wound especially on this foot 9/3; right second met head. Still the same punched-out area with thick callus and subcutaneous tissue around the wound removing the circumference demonstrates repetitively undermining area. I have removed all the subcutaneous tissue associated with this. We have been using silver collagen 9/15; right second metatarsal head. Same punched-out thick callus and subcutaneous tissue around the wound area. Still requiring debridement we have been using silver collagen I changed back to silver alginate X-ray last time showed no evidence of osteomyelitis. Culture showed Klebsiella and he was started and Keflex apparently just 4 days ago 9/24; right second metatarsal head. He is completed the antibiotics I gave him for 10 days. Same punched-out thick callus and subcutaneous tissue around the area. Still requiring debridement. We have been using silver alginate. The area itself looks swollen and this could be just [place] that comes on this area with walking on a prosthesis on the left however I think he needs an MRI and I am going to order that today. There is not an option to offload this further 10/1; right second plantar metatarsal head. MRI booked for October 6. Generally looking better today using silver alginate 10/8; right second plantar metatarsal head. MRI that was done on 10/6 was negative for osteomyelitis noted prior amputations of the second and fourth toes at the [facility]. Prior amputation of the third toe to the base of the middle phalanx. Prior amputation of the fifth toe to the head of the metatarsal. 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 He does not  recall about hearing about this. He did not have any pain but then again he has reduced sensation from diabetic neuropathy 10/15; right second plantar metatarsal head. Still in the same condition callus nonviable subcutaneous tissue which cleans up nicely with debridement but reforms by the next week. The patient tells me that he is offloading this is much as he can including taking his wheelchair to dialysis. We have been using silver alginate, changed to silver collagen today Objective Constitutional Sitting or standing Blood Pressure is within target range for patient.. Pulse regular and within target range for patient.Marland Kitchen Respirations regular, non-labored and within target range.. Temperature is normal and within the target range for the patient.Marland Kitchen Appears in no distress. Vitals Time Taken: 2:35 PM, Height: 73 in,  Weight: 202 lbs, BMI: 26.6, Temperature: 98.3 F, Pulse: 85 bpm, Respiratory Rate: 18 breaths/min, Blood Pressure: 101/86 mmHg. General Notes: Wound exam; extensive debridement using a #5 curette removing callus subcutaneous tissue. Once again I am able to get to a clean surface albeit with some depth there is no evidence of infection. Integumentary (Hair, Skin) Wound #41 status is Open. Original cause of wound was Gradually Appeared. The wound is located on the Right Metatarsal head second. The wound measures 0.2cm length x 0.3cm width x 0.9cm depth; 0.047cm^2 area and 0.042cm^3 volume. There is Fat Layer (Subcutaneous Tissue) Exposed exposed. There is no tunneling noted, however, there is undermining starting at 12:00 and ending at 12:00 with a maximum distance of 0.8cm. There is a medium amount of serosanguineous drainage noted. The wound margin is well defined and not attached to the wound base. There is medium (34-66%) pink, pale granulation within the wound bed. There is a medium (34-66%) amount of necrotic tissue within the wound bed including Adherent  Slough. 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 There was a Excisional Skin/Subcutaneous Tissue Debridement with a total area of 0.06 sq cm performed by Ricard Dillon., MD. With the following instrument(s): Curette to remove Viable and Non-Viable tissue/material. Material removed includes Callus and Subcutaneous Tissue and. No specimens were taken. A time out was conducted at 15:05, prior to the start of the procedure. A Minimum amount of bleeding was controlled with Pressure. The procedure was tolerated well with a pain level of 0 throughout and a pain level of 0 following the procedure. Post Debridement Measurements: 0.2cm length x 0.3cm width x 0.9cm depth; 0.042cm^3 volume. Character of Wound/Ulcer Post Debridement is improved. Severity of Tissue Post Debridement is: Fat layer exposed. Post procedure Diagnosis Wound #41: Same as Pre-Procedure Plan Follow-up Appointments: Return Appointment in 1 week. Dressing Change Frequency: Wound #41 Right Metatarsal head second: Change Dressing every other day. Skin Barriers/Peri-Wound Care: Barrier cream - as needed for maceration, wetness, or redness. Wound Cleansing: May shower and wash wound with soap and water. Primary Wound Dressing: Wound #41 Right Metatarsal head second: Silver Collagen - moisten with hydrogel ([place] jelly) or saline Secondary Dressing: Wound #41 Right Metatarsal head second: Kerlix/Rolled Gauze Dry Gauze Other: - felt to periwound and place one below wound. lightly wrap coban to hold dressing in place. felt /  pad wound Edema Control: Avoid standing for long periods of time Elevate legs to the level of the heart or above for 30 minutes daily and/or when sitting, a frequency of: - throughout the day. Off-Loading: Other: - felt to periwound and place one below wound. Additional Orders / Instructions: Other: - minimize walking and standing on foot to aid in wound healing and callus buildup. 1. Change the primary dressing to silver collagen 2. Emphasized him above offloading this is best he can. He is familiar with his. Electronic Signature(s) Signed: 10/11/2019 5:35:18 PM By: Linton Ham MD Entered By: Linton Ham on 10/11/2019 15:37:55 -------------------------------------------------------------------------------- SuperBill Details Patient Name: Date of Service: USBALDO, [name] 10/11/2019 Medical Record [id number] Patient Account Number: [id number] Date of Birth/Sex: Treating RN: April 19, 1951 (68 y.o. [name] Primary  Care Provider: [name] Other Clinician: Referring Provider: Treating Provider/Extender:Robson, Jeronimo Greaves, Buena Irish in Treatment: 17 Diagnosis Coding ICD-10 Codes Code Description E11.621 Type 2 diabetes mellitus with foot ulcer L97.512 Non-pressure chronic ulcer of other part of right foot with fat layer exposed I70.298 Other atherosclerosis of native arteries of extremities, other extremity Facility Procedures The patient participates with Medicare or their insurance follows the Medicare Facility Guidelines: CPT4 Code Description Modifier Quantity [id number] 11042 - DEB SUBQ TISSUE 20 SQ CM/< 1 ICD-10 Diagnosis Description L97.512 Non-pressure chronic ulcer of  other part of right foot with fat layer exposed Physician Procedures CPT4 Code Description: [id number] - WC PHYS SUBQ TISS 20 SQ CM ICD-10 Diagnosis Description L97.512 Non-pressure chronic ulcer of other part of right foot with Modifier: fat layer e Quantity: 1  xposed Electronic Signature(s) Signed: 10/11/2019 5:35:18 PM By: Linton Ham MD Entered By: Linton Ham on 10/11/2019 15:38:17

## 2019-10-15 DIAGNOSIS — N186 End stage renal disease: Secondary | ICD-10-CM | POA: Diagnosis not present

## 2019-10-15 DIAGNOSIS — Z992 Dependence on renal dialysis: Secondary | ICD-10-CM | POA: Diagnosis not present

## 2019-10-15 DIAGNOSIS — N2581 Secondary hyperparathyroidism of renal origin: Secondary | ICD-10-CM | POA: Diagnosis not present

## 2019-10-17 DIAGNOSIS — N2581 Secondary hyperparathyroidism of renal origin: Secondary | ICD-10-CM | POA: Diagnosis not present

## 2019-10-17 DIAGNOSIS — Z992 Dependence on renal dialysis: Secondary | ICD-10-CM | POA: Diagnosis not present

## 2019-10-17 DIAGNOSIS — N186 End stage renal disease: Secondary | ICD-10-CM | POA: Diagnosis not present

## 2019-10-18 ENCOUNTER — Encounter (HOSPITAL_BASED_OUTPATIENT_CLINIC_OR_DEPARTMENT_OTHER): Payer: Medicare Other | Admitting: Internal Medicine

## 2019-10-18 ENCOUNTER — Other Ambulatory Visit: Payer: Self-pay

## 2019-10-18 DIAGNOSIS — R5383 Other fatigue: Secondary | ICD-10-CM | POA: Diagnosis not present

## 2019-10-18 DIAGNOSIS — M79645 Pain in left finger(s): Secondary | ICD-10-CM | POA: Diagnosis not present

## 2019-10-18 DIAGNOSIS — I70208 Unspecified atherosclerosis of native arteries of extremities, other extremity: Secondary | ICD-10-CM | POA: Diagnosis not present

## 2019-10-18 DIAGNOSIS — E119 Type 2 diabetes mellitus without complications: Secondary | ICD-10-CM | POA: Diagnosis not present

## 2019-10-18 DIAGNOSIS — Z0000 Encounter for general adult medical examination without abnormal findings: Secondary | ICD-10-CM | POA: Diagnosis not present

## 2019-10-18 DIAGNOSIS — E1151 Type 2 diabetes mellitus with diabetic peripheral angiopathy without gangrene: Secondary | ICD-10-CM | POA: Diagnosis not present

## 2019-10-18 DIAGNOSIS — L989 Disorder of the skin and subcutaneous tissue, unspecified: Secondary | ICD-10-CM | POA: Diagnosis not present

## 2019-10-18 DIAGNOSIS — L97812 Non-pressure chronic ulcer of other part of right lower leg with fat layer exposed: Secondary | ICD-10-CM | POA: Diagnosis not present

## 2019-10-18 DIAGNOSIS — R0602 Shortness of breath: Secondary | ICD-10-CM | POA: Diagnosis not present

## 2019-10-18 DIAGNOSIS — E559 Vitamin D deficiency, unspecified: Secondary | ICD-10-CM | POA: Diagnosis not present

## 2019-10-18 DIAGNOSIS — I132 Hypertensive heart and chronic kidney disease with heart failure and with stage 5 chronic kidney disease, or end stage renal disease: Secondary | ICD-10-CM | POA: Diagnosis not present

## 2019-10-18 DIAGNOSIS — E1122 Type 2 diabetes mellitus with diabetic chronic kidney disease: Secondary | ICD-10-CM | POA: Diagnosis not present

## 2019-10-18 DIAGNOSIS — Z1159 Encounter for screening for other viral diseases: Secondary | ICD-10-CM | POA: Diagnosis not present

## 2019-10-18 DIAGNOSIS — L97512 Non-pressure chronic ulcer of other part of right foot with fat layer exposed: Secondary | ICD-10-CM | POA: Diagnosis not present

## 2019-10-18 DIAGNOSIS — Z125 Encounter for screening for malignant neoplasm of prostate: Secondary | ICD-10-CM | POA: Diagnosis not present

## 2019-10-18 DIAGNOSIS — G250 Essential tremor: Secondary | ICD-10-CM | POA: Diagnosis not present

## 2019-10-18 DIAGNOSIS — E11621 Type 2 diabetes mellitus with foot ulcer: Secondary | ICD-10-CM | POA: Diagnosis not present

## 2019-10-18 NOTE — Progress Notes (Signed)
DHEERAJ, HAIL ([id number]) Visit Report for 10/18/2019 Debridement Details Patient Name: Date of Service: TELESFORO, BROSNAHAN 10/18/2019 3:00 PM Medical Record [id number] Patient Account Number: [id number] Date of Birth/Sex: 1951-03-05 (68 y.o. M) Treating RN: Deon Pilling Primary Care Provider: [name] Other Clinician: Referring Provider: Treating Provider/Extender:Olivea Sonnen, Birdena Crandall in Treatment: 18 Debridement Performed for Wound #41 Right Metatarsal head second Assessment: Performed By: Physician Ricard Dillon., MD Debridement Type: Debridement Severity of Tissue Pre Fat layer exposed Debridement: Level of Consciousness (Pre- Awake and Alert procedure): Pre-procedure Verification/Time Out Taken: Yes - 16:00 Start Time: 16:01 Pain Control: Lidocaine 4% Topical Solution Total Area Debrided (L x W): 0.5 (cm) x 0.5 (cm) = 0.25 (cm) Tissue and other material Non-Viable, Callus, Skin: Dermis debrided: Level: Skin/Dermis Debridement Description: Selective/Open Wound Instrument: Curette Bleeding: Minimum Hemostasis Achieved: Pressure End Time: 16:06 Procedural Pain: 0 Post Procedural Pain: 0 Response to Treatment: Procedure was tolerated well Level of Consciousness Awake and Alert (Post-procedure): Post Debridement Measurements of Total Wound Length: (cm) 0.3 Width: (cm) 0.3 Depth: (cm) 0.3 Volume: (cm) 0.021 Character of Wound/Ulcer Post Improved Debridement: Severity of Tissue Post Debridement: Fat layer exposed Post Procedure Diagnosis Same as Pre-procedure Electronic Signature(s) Signed: 10/18/2019 6:29:04 PM By: Linton Ham MD Signed: 10/18/2019 6:35:09 PM By: Deon Pilling Entered By: Linton Ham on 10/18/2019 16:43:55 -------------------------------------------------------------------------------- HPI Details Patient Name: Date of Service: FAOLAN, [place] 10/18/2019 3:00 PM Medical Record [id number]  Patient Account Number: [id number] Date of Birth/Sex: Treating RN: 1951-04-30 (68 y.o. [name] Primary Care Provider: [name] Other Clinician: Referring Provider: Treating Provider/Extender:Neenah Canter, Jeronimo Greaves, Buena Irish in Treatment: 18 History of Present Illness Location: Patient presents with a wound to bilateral feet. Quality: Patient reports experiencing essentially no pain. Severity: Mildly severe wound with no evidence of infection Duration: Patient has had the wound for greater than 2 weeks prior to presenting for treatment HPI Description: The patient is a pleasant 68 yrs old bm here for evaluation of ulcers on the plantar aspect of both feet. 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 He has a left arm graft for access. He has been trying to stay off his feet for weeks but does not seem to have any improvement in the wound. He has been seeing someone at the foot center and was referred to the Wound Care center for further evaluation. 12/30/15 the patient has 3 wounds one over the right first metatarsal head and 2 on the left foot at the left fifth and lleft first metatarsal head. All of these look relatively similar. The one over the left fifth probe to bone I could not prove that any of the others did. I note his MRI in November that did not show osteomyelitis. His peripheral pulses seem robust. All of these underwent surgical debridement to remove callus nonviable skin and subcutaneous tissue 01/06/16; the patient had his sutures removed from the right fifth ray amputation. There may be a small open part of this superiorly but otherwise the incision looks good. Areas over his right first, left first and left fifth metatarsal head all underwent surgical debridement as a varying degree of callus, skin and nonviable subcutaneous tissue. The area that is most worrisome is the right fifth metatarsal head which has  a wound probes precariously close to bone. There is no purulent drainage or erythema 01/13/16; I'm not exactly sure of the status of the right fifth ray amputation site however  he follows with Dr. [name] later this week. The area over his right first plantar metatarsal head, left first and fifth plantar metatarsal head are all in the same status. Thick circumferential callus, nonviable subcutaneous tissue. Culture of the left fifth did not culture last week 01/20/16; all of the patient's wounds appear and roughly the same state although his amputation site on the right lateral foot looks better. His wounds over the right first, left first and left fifth metatarsal heads all underwent difficult surgical debridement removing circumferential callus nonviable skin and subcutaneous tissue. There is no overt evidence of infection in these areas. MRI at the end of December of the left foot did not show osteomyelitis, right foot showed osteomyelitis of the right fifth digit he is now status post amputation. 01/27/16 the patient's wounds over his plantar first and fifth metatarsal heads on the left all appear better having been started on a total contact cast last week. They were debridement of circumferential callus and nonviable subcutaneous tissue as was the wound over the first metatarsal head. His surgical incision on the right also had some light surface debridement done. 03/23/2016 -- the patient was doing really well and most of his wounds had almost completely healed but now he came back today with a history of having a discharge from the area of his right foot on the plantar aspect and also between his first and second toe. He also has had some discharge from the left foot. Addendum: I spoke to the PA [name] at the dialysis center whose fax number is [phone]. We discussed the infection the patient has and she will put the patient on vancomycin and Fortaz until the final  culture report is back. We will fax this as soon as available. 03/30/2016 -- left foot x-ray IMPRESSION:No definitive osteomyelitis noted. X-ray of the right foot -- IMPRESSION: 1. Soft tissue swelling. Prior amputation right fifth digit. No acute or focal bony abnormality identified. If osteomyelitis remains a clinical concern, MRI can be obtained. 2. Peripheral vascular disease. His culture reports have grown an MSSA -- and we will fax this report to his hemodialysis center. He was called in a prescription of oral doxycycline but I have told her not to fill this in as he is already on IV antibiotics. 04/06/2016 -- a few days ago, I spoke to the hemodialysis center nurse who had stopped the IV antibiotics and he was given a prescription for doxycycline 100 mg by mouth twice a day for a week and he is on this at the present time. 05/11/2016 -- he has recently seen his PCP this week and his hemoglobin A1c was 7. He is working on his paperwork to get his orthotic shoes. 05/25/2016 -- -- x-ray of the left foot IMPRESSION:No acute bony abnormality. No radiographic changes of acute osteomyelitis. No change since prior study. X-ray of the right foot -- IMPRESSION: Postsurgical changes are seen involving the fifth toe. No evidence of acute osteomyelitis. he developed a large blister on the medial part of his right foot and this opened out and drain fluid. 06/01/2016 -- he has his MRI to be done this afternoon. 06/15/2016 -- MRI of the left forefoot without contrast shows 2 separate regions of cutaneous and subcutaneous edema and possible ulceration and blistering along the ball of the foot. No obvious osteomyelitis identified. MRI of the right foot showed cutaneous and subcutaneous thickening plantar to the first digit sesamoid with an ulcer crater but no underlying osteomyelitis is identified.  06/22/2016 -- the right foot plantar ulcer has been draining a lot of seropurulent material for the last  few days. 06/30/2016 -- spoke to the dialysis center and I believe I spoke to [place] a PA at the center who discussed with me and agreed to putting Legrand Como on vancomycin until his cultures arrive. On review of his culture report no WBCs were seen or no organisms were seen and the culture was reincubated for better growth. The final report is back and there were no predominant growth including Streptococcus or Staphylococcus. Clinically though he has a lot of drainage from both wounds a lot of undermining and there is further blebs on the left foot towards the interspace between his first and second toe. 08/10/2016 -- the culture from the right foot showed normal skin flora and there was no Staphylococcus aureus was group A streptococcus isolated. 09/21/2016 -- -- MRI of the right foot was done on 09/13/2016 - IMPRESSION: Findings most consistent with acute osteomyelitis throughout the great toe, sesamoid bones and plantar aspect of the head of the first metatarsal. Fluid in the sheath of the flexor tendon of the great toe could be sympathetic but is worrisome for septic tenosynovitis. First MTP joint effusion worrisome for septic joint. He was admitted to the hospital on 09/11/2016 and treated for a fever with vancomycin and cefepime. He was seen by Dr. Bobby Rumpf of infectious disease who recommended 6 weeks treatment with vancomycin and ceftazidime with his hemodialysis and to continue to see as in the wound clinic. Vascular consult was pending. The patient was discharged home on 09/14/2016 and he would continue with IV antibiotics for 6 weeks. 09/28/16 wound appears reasonably healthy. Continuing with total contact cast 10/05/16 wound is smaller and looking healthy. Continue with total contact cast. He continues on IV antibiotics 10/12/2016 -- he has developed a new wound on the dorsal aspect of his left big toe and this is a superficial injury with no surrounding cellulitis. He has  completed 30 days of IV antibiotics and is now ready to start his hyperbaric oxygen therapy as per his insurance company's recommendation. 10/19/2016 -- after the cast was removed on the right side he has got good resolution of his ulceration on the right plantar foot. he has a new wound on the left plantar foot in the region of his fourth metatarsal and this will need sharp debridement. 10/26/2016 -- Xray of the right foot complete: IMPRESSION: Changes consistent with osteomyelitis involving the head of the first metatarsal and base of the first proximal phalanx. The sesamoid bones are also likely involved given their positioning. 11/02/2016 -- there still awaiting insurance clearance for his hyperbaric oxygen therapy and hopefully he will begin treatment soon. 11/09/2016 -- started with hyperbaric oxygen therapy and had some barotrauma to the right ear and this was seen by ENT who was prescribed Afrin drops and would probably continue with HBO and he is scheduled for myringotomy tubes on Friday 11/16/2016 -- he had his myringotomy tubes placed on Friday and has been doing much better after that with some fluid draining out after hyperbaric treatment today. The pain was much better. 11/30/2016 -- over the last 2 days he noticed a swelling and change of color of his right second toe and this had been draining minimal fluid. 12/07/2016 -- x-ray of the right foot -- IMPRESSION:1. Progressive ulceration at the distal aspect of the second digit with significant soft tissue swelling and osseous changes in the distal phalanx compatible with osteomyelitis.  2. Chronic osteomyelitis at the first MTP joint. 3. Ulcerations at the second and third toes as well without definite osseous changes. Osteomyelitis is not excluded. 4. Fifth digit amputation. On 12/01/2016 I spoke to Dr. Bobby Rumpf, the infectious disease specialist, who kindly agreed to treat this with IV antibiotics and he would call in the  order to the dialysis center and this has been discussed in detail with the patient who will make the appropriate arrangements. The patient will also book an appointment as soon as possible to see Dr. [name] in the office. Of note the patient has not been on antibiotics this entire week as the dialysis center did not receive any orders from Dr. [name]. I got in touch with Dr. [name] who tells me the patient has an appointment to see him this coming Wednesday. 12/14/2016 -- the patient is on ceftazidime and vancomycin during his dialysis, and I understand this was put on by his nephrologist Dr. Raliegh Ip possibly after speaking with infectious disease Dr. [name] 12/23/16; patient was on my schedule today for a wound evaluation as he had difficulties with his schedule earlier this week. I note that he is on vancomycin and ceftazidine at dialysis. In spite of this he arrives today with a new wound on the base of the right second toe this easily probes to bone. The known wound at the tip of the second toe With this area. He also has a superficial area on the medial aspect of the third toe on the side of the DIP. This does not appear to have much depth. The area on the plantar left foot is a deep area but did not probe the bone 12/28/16 -- he has brought in some lab work and the most recent labs done showed a hemoglobin of 12.1 hematocrit of 36.3, neutrophils of 51% WBC count of 5.6, BN of 53, albumin of 4.1 globulin of 3.7, vancomycin 13 g per mL. 01/04/2017 -- he saw Dr. [name] who has recommended a amputation of the right second toe and he is awaiting this date. He sees Dr. Bobby Rumpf of infectious disease tomorrow. The bone culture taken on 12/28/2016 had no growth in 2 days. 01/11/2017 -- was seen by Dr. Bobby Rumpf regarding the management and has recommended a eval by vascular surgeons. He recommended to continue the antibiotics during his hemodialysis. Xray of the left foot -- IMPRESSION:  No acute fracture, dislocation, or osseous erosion identified. 01/18/2017 -- he has his vascular workup later today and he is going to have his right foot second toe amputation this coming Friday by Dr. [name]. We have also put in for a another 30 treatments with hyperbaric oxygen therapy 01/25/2017 -- I have reviewed Dr. Donnetta Hutching is vascular report from last week where he reviewed him and thought his vascular function was good enough to heal his amputation site and no further tests were recommended. He also had his orthopedic related to surgery which is still pending the notes and we will review these next week. 02/01/2017 -- the operative remote of Dr. 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 02/08/2017 --he was seen by Dr. [name] on 02/03/2017 -- patient is on ceftaz and  Vanco. after a thorough review he had recommended to continue with antibiotics during hemodialysis. The patient was seen by Dr. [name], but I do not find any follow-up note on the electronic medical record. he has removed the dressing over the right foot to remove the sutures and asked him to see him back in a month's time 02/22/2017 -- patient has been febrile and has been having symptoms of the upper respiratory tract infection but has not been checked for the flu and it's been over 4 days now. He says he is feeling better today. Some drainage between his left first and second toe and this needed to be looked at 03/08/2017 -- was seen by Dr. Bobby Rumpf on 03/07/2017 after review he will stop his antibiotics and see him back in a month to see if he is feeling  well. 03/15/2017 -- he has completed his course of hyperbaric oxygen therapy and is doing fine with his health otherwise. 03/22/2017 -- his nutritionist at the dialysis center has recommended a protein supplement to help build his collagen and we will prescribe this for him when he has the details 05/03/2017 -- he recently noticed the area on the plantar aspect of his fifth metatarsal head which opened out and had minimal drainage. 05/10/2017 -- -- x-ray of the left foot -- IMPRESSION:1. No convincing conventional radiographic evidence of active osteomyelitis.2. Active soft tissue ulceration at the tip of the fifth digit, and at the lateral aspect of the foot adjacent the base of the fifth metatarsal. 3. Surgical changes of prior fourth toe amputation. 4. 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 5. Small vessel atherosclerotic vascular calcifications. 6. 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 ======= Readmission after 5 weeks: 06/15/2017 -- the patient returns after 5 weeks having had an MRI done on 05/17/2017 MRI of the left foot without contrast showed soft tissue ulcer overlying the base of the fifth metatarsal. 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 No drainable fluid collection to suggest an abscess. He was in hospital between 05/16/2017 and 05/25/2017 -- and on discharge was asked to follow-up with Dr. [name] and Dr. [name]. He was treated 2 weeks post discharge with vancomycin plus ceftriaxone with hemodialysis and oral metronidazole 500 mg 3 times a day. 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 last hemoglobin A1c was 6.7% He had a postoperative MR of the foot on 05/23/2017 -- which showed no new areas of cortical bone loss, edema or soft tissue ulceration to suggest  osteomyelitis. the patient has completed his course of IV antibiotics during dialysis and is to see the infectious disease doctor tomorrow. Dr. [name] had seen him after suture removal and asked him to keep the wound with a dry dressing 06/21/2017 -- he was seen by Dr. Bobby Rumpf of infectious disease on 06/16/2017 -- he stopped his Flagyl and will see him back in 6 weeks. He was asked to follow-up with Dr. [name] and with the wound clinic clinic. 07/05/17; bone biopsy from last time showed acute osteomyelitis. This is from the reminiscent in the left fifth metatarsal. Culture result is apparently still pending [holding for anaerobe}. From my understanding in this case this is been a progressive necrotic wound which is deteriorated markedly over the last 3 weeks since he returned here. He now has a large area of  exposed bone which was biopsied and cultured last week. Dr. Graylon Good has put him on vancomycin and Fortaz during his hemodialysis and Flagyl orally. He is to see Dr. [name] next week 07/19/2017 -- the patient was reviewed by Dr. [name] of orthopedics who reviewed the case in detail and agreed with the plan to continue with IV antibiotics, aggressive wound care and hyperbaric oxygen therapy. He would see him back in 3 weeks' time 08/09/2017 -- saw Dr. Bobby Rumpf on 08/03/2017 -he was restarted on his antibiotics for 6 weeks which included vanco, ceftaz and Flagyl. he recommended continuing his antibiotics for 6 weeks and reevaluate his completion date. He would continue with wound care and hyperbaric oxygen therapy. 08/16/2017 -- I understand he will be completing his 6 weeks of antibiotics sometime later this week. 09/19/2017 -- he has been without his wound VAC for the last week due to lack of supplies. He has been packing his wound with silver alginate. 10/04/2017 -- he has an appointment with Dr. [name] tomorrow and hence we will not apply the wound VAC after his dressing  changes today. 10/11/2017 -- he was seen by Dr. Bobby Rumpf on 10/05/2017, noted that the patient was currently off antibiotics, and after thorough review he recommended he follow-up with the wound care as per plan and no further antibiotics at this point. 11/29/17; patient is arrived for the wound on his left lateral foot.Marland Kitchen He is completed IV antibiotics and 2 rounds of hyperbaric oxygen for treatment of underlying osteomyelitis. He arrives today with a surface on most of the wound area however our intake nurse noted drainage from the superior aspect. This was brought to my attention. 12/13/2017 -- the right plantar foot had a large bleb and once the bleb was opened out a large callus and subcutaneous debris was removed and he has a plantar ulcer near the fourth metatarsal head 12/28/17 on evaluation today patient appears to be doing acutely worse in regard to his left foot. The wound which has been appearing to do better as now open up more deeply there is bone palpable at the base of the wound unfortunately. He tells me that this feels "like it did when he had osteomyelitis previously" he also noted that his second toe on the left foot appears to be doing worse and is swollen there does appear to be some fluid collected underneath. His right foot plantar ulcer appears to be doing somewhat better at this point and there really is no complication at the site currently. No fevers, chills, nausea, or vomiting noted at this time. Patient states that he normally has no pain at this site however. Today he is having significant pain. 01/03/18; culture done last week showed methicillin sensitive staph aureus and group B strep. He was on Septra however he arrives today with a fever of 101. He has a functional dialysis shunt in his left arm but has had no pain here. No cough. He still makes some urine no dysuria he did have abdominal pain nausea and diarrhea over the weekend but is not had any diarrhea  since yesterday. Otherwise he has no specific complaints 01/05/18; the patient returns today in follow-up for his presentation from 1/8. At that point he was febrile. I gave him some Levaquin adjusted for his dialysis status. He tells me the fever broke that night and it is not likely that this was actually a wound infection. He is gone on to have an MRI of the left foot; this showed  interval development of an abnormal signal from the base of the third and fourth metatarsal and in the cuboid reminiscent consistent with osteomyelitis. There is no mention of the left second toe I think which was a concern when it was ordered. The patient is taking his Levaquin 01/10/18; the patient has completed his antibiotics today. The area on the left lateral foot is smaller but it still probes easily to bone. He has underlying osteomyelitis here and sees Dr. Megan Salon of infectious disease this week The area on the right third plantar metatarsal head still requires debridement not much change in dimensions He has a new wound on the medial tip of his right third toe again this probes to bone. He only noticed this 2 days ago 01/17/18; the patient saw Dr. [name] last week and he is back on Vanco and Fortaz at dialysis. 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 We are previously seeing him for a wound also on the base of the fourth med head on the right. X- ray of this area did not show osteomyelitis in relation to a new wound on the tip of his right third toe. Again this probes to bone. Finally his second toe on the left which didn't really show anything on the MRI at least no report appears to have a separated cutaneous area which I think is going to come right off the tip of his toe and leave exposed bone. Whether this is infectious or ischemic I am not clear Culture I did from the third toe last week  which was his new wound was negative. Plain x-ray on the right foot did not show osteomyelitis in the toes 01/24/18; the patient is going went to see Dr. [name]. He is going to have a amputation of the left second and right third toe although paradoxically the left second toe looks better than last week. We have treating a deep probing wound on the left lateral foot and the area over the third metatarsal head on the right foot. 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 Also a debridement including bone of the left lateral foot and a closure which is still sutured. This is a bit surprising. Also apparent debridement of the base of the right third toe wound. 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 I'm not sure exactly where we are in the frame of things here. The wound on the left lateral foot is still sutured also the amputation sites of the left second and right third toe. He is been using Xeroform to the sutured areas silver alginate to the right foot 02/14/18; he continues on Rocephin at dialysis for osteomyelitis. I still don't have a good sense of where we are in the treatment duration. The wounds on the left lateral foot had the sutures removed and this clearly still probes deeply. We debrided the area with a #5 curet. We'll use silver alginate to the wound the area over the right third met head also required debridement of callus skin and subcutaneous tissue and necrotic debris over the wound surface. This tunnels superiorly but I did not unroofed this today. The areas  on the tip of his toes have a crusted surface eschar I did not debridement this either. 02/21/18; he continues on Rocephin at dialysis for osteomyelitis. I don't have a good sense of where we are  and the treatment duration. The wounds on the left lateral foot is callused over on presentation but requires debridement. The area on the right third med head plantar aspect actually is measuring smaller 02/28/18;patient is on Vanco and Fortaz at dialysis, I'm not sure why I thought this was Rocephin above unless it is been changed. I don't have a good sense of time frame here. Using silver collagen to the area on the left lateral foot and right third med head plantar foot 03/07/18; patient is completing bank and Fortaz at dialysis soon. He is using Silver collagen to the area on the left lateral foot and the right third metatarsal head. He has a smaller superficial wound distal to the left lateral foot wound. 03/14/18-he is here in follow-up evaluation for multiple ulcerations to his bilateral feet. He presents with a new ulceration to the plantar aspect of the left foot underneath the blister, this was deroofed to reveal a partial thickness ulcer. He is voicing no complaints or concerns, tolerated dialysis yesterday. We will continue with same treatment plan and he will follow-up next week 03/21/18; the patient has a area on the lateral left foot which still has a small probing area. The overall surface area of the wound is better. He presented with a new ulceration on the left foot plantar fifth metatarsal head last week. The area on the right third metatarsal head appears smaller.using silver alginate to all wound areas. The patient is changing the dressing himself. He is using a Darco forefoot offloading are on the right and a healing sandal on the left 03/28/18; original wound left lateral foot. Still nowhere close to looking like a heeling surface secondary wound on the left lateral foot at the base of his question fifth metatarsal which was new last week Right third metatarsal head. We have been using silver alginate all wounds 04/04/18; the original wound on the left lateral foot again heavy  callus surrounding thick nonviable subcutaneous tissue all requiring debridement. The new wound from 2 weeks ago just near this at the base of the fifth metatarsal head still looks about the same Unfortunately there is been deterioration in the third medical head wound on the right which now probes to bone. I must say this was a superficial wound at one point in time and I really don't have a good frame of reference year. I'll have to go back to look through records about what we know about the right foot. 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 He is also been to see Dr. [name]. Somewhere in here as somebody is ordered a VASCULAR evaluation which was done on 03/22/18. On the right is anterior tibial artery was monophasic triphasic at the posterior tibial artery. On the left anterior tibial artery monophasic posterior tibial artery monophasic. ABI in the right was 1.43 on the left 1.21. TBIs on the right at 0.41 and on the left at 0.32. A vascular consult was recommended and I think has been arranged. 04/11/18; Mr. Ofarrell has not had an MRI of the right foot since 2017. Recent x-ray of the right foot done in January and February was negative. However he has had a major deterioration in the  wound over the third met head. He is completed antibiotics last week at dialysis [place]. I think he is going to see vascular in follow-up. The areas on the left lateral foot and left plantar fifth metatarsal head both look satisfactory. I debrided both of these areas although the tissue here looks good. The area on the left lateral foot once probe to bone it certainly does not do that now 04/18/18; MRI of the right foot is on Thursday. He has what looks to be serosanguineous purulent drainage coming out of the wound over the right third metatarsal head today. He completed antibiotics bank and Fortaz 2 weeks  ago at dialysis. He has a vascular follow-up with regards to his arterial insufficiency although I don't exactly see when that is booked. The areas on the left foot including the lateral left foot and the plantar left fifth metatarsal head look about the same. 04/25/18-He is here in follow-up evaluation for bilateral foot ulcers. MRI obtained was negative for osteomyelitis. Wound culture was negative. We will continue with same treatment plan he'll follow-up next week 05/02/18; the right plantar foot wound over the third metatarsal head actually looks better than when I last saw this. His MRI was negative for osteomyelitis wound culture was negative. On the left plantar foot both wounds on the plantar fifth metatarsal head and on the lateral foot both are covered in a very hard circumferential callus. 05/09/18; right plantar foot wound over the third metatarsal head stable from last week. Left plantar foot wound over the fifth metatarsal head also stable but with callus around both wound areas The area on the left lateral foot had thick callus over a surface and I had some thoughts about leaving this intact however it felt boggy. We've been using silver alginate all wounds 05/16/18; since the patient was last here he was hospitalized from 5/15 through 5/19. 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 This grew MRSA. He was placed on vancomycin.MRI of the left foot suggested "progressive" osteomyelitis and bone destruction of the cuboid and the base of the fifth metatarsal. Stable erosive changes of the base of the second metatarsal. I'm wondering if they're aware that he had surgery and debridement in the area of the underlying bone previously by Dr. [name]. In any case, He was also revascularized with an angioplasty and stenting of the left tibial peroneal trunk and angioplasty of the left  posterior tibial artery. He is on vancomycin at dialysis. He has a 2 week follow-up with infectious disease. He has vascular surgery follow-up. He was started on Plavix. 05/23/18; the patient's wound on the lateral left foot at the level of the fifth metatarsal head and the plantar wound on the plantar fifth metatarsal head both look better. The area on the right third metatarsal head still has depth and undermining. We've been using silver alginate. He is on vancomycin. He has been revascularized on the left 05/30/18; he continues on vancomycin at dialysis. Revascularized on the left. The area on the left lateral foot is just about closed. Unfortunately the area over the plantar fifth metatarsal head undermining superiorly as does the area over the right third metatarsal head. Both of these significantly deteriorated from last week. He has appointments next week with infectious disease and orthopedics I delayed putting on a cast on the left until those appointments which therefore we made we'll  bring him in on Friday the 14th with the idea of a cast on the left foot 06/06/18; he continues on vancomycin at dialysis. Revascularized on the left. He arrives today with a ballotable swelling just above the area on the left lateral foot where his previous wound was. He has no pain but he is reasonably insensate. The difficult areas on the plantar left fifth metatarsal head looks stable whereas the area on the right third plantar metatarsal head about the same as last week there is undermining here although I did not unroofed this today. He required a considerable debridement of the swelling on the left lateral foot area and I unroofed an abscess with a copious amount of brown purulent material which I obtained for culture. I don't believe during his recent hospitalization he had any further imaging although I need to review this. Previous cultures from this area done in this clinic showed MRSA, I would be  surprised if this is not what this is currently even though he is on vancomycin. 06/13/18; he continues on vancomycin at dialysis however he finishes this on Sunday and then graduates the doxycycline previously prescribed by Dr. [name]. The abscess site on the lateral foot that I unroofed last time grew moderate amounts of methicillin-resistant staph aureus that is both vancomycin and tetracycline sensitive so we should be okay from that regard. He arrives today in clinic with a connection between the abscess site and the area on the lateral foot. We've been using silver alginate to all his wound areas. The right third metatarsal head wound is measuring smaller. Using silver alginate on both wound areas 06/20/18; transitioning to doxycycline prescribed by Dr. [name]. The abscess site on the lateral foot that I unroofed 2 weeks ago grew MRSA. That area has largely closed down although the lateral part of his wound on the fifth metatarsal head still probes to bone. I suspect all of this was connected. On the right third metatarsal head smaller looking wound but surrounded by nonviable tissue that once again requires debridement using silver alginate on both areas 06/27/18; on doxycycline 100 twice a day. The abscess on the lateral foot has closed down. Although he still has the wound on the left fifth metatarsal head that extends towards the lateral part of the foot. The most lateral part of this wound probes to bone Right third metatarsal head still a deep probing wound with undermining. Nevertheless I elected not to debridement this this week If everything is copious static next week on the left I'm going to attempt a total contact cast 07/04/18; he is tolerating his doxycycline. The abscess on the left lateral foot is closed down although there is still a deep wound here that probes to bone. We will use silver collagen under the total contact cast Silver collagen to the deep wound over the right  third metatarsal head is well 07/11/18; he is tolerating doxycycline. The left lateral fifth plantar metatarsal head still probes deeply but I could not probe any bone. We've been using silver collagen and this will be the second week under the total contact cast Silver collagen to deep wound over the right third metatarsal head as well 07/18/18; he is still taking doxycycline. The left lateral fifth plantar metatarsal this week probes deeply with a large amount of exposed bone. Quite a deterioration. He required extensive debridement. Specimens of bone for pathology and CNS obtained Also considerable debridement on the right third metatarsal head 07/25/18; bone for pathology last week from  the lateral left foot showed osteomyelitis. CandS of the bone Enterobacter. And Klebsiella. He is now on ciprofloxacin and the doxycycline is stopped [Dr. [name] of infectious disease] area He has an appointment with Dr. [name] tomorrow I'm going to leave the total contact cast off for this week. May wish to try to reapply that next week or the week after depending on the wound bed looks. I'm not sure if there is an operative option here, previously is followed with Dr. [name] 08/01/18 on evaluation today patient presents for reevaluation. He has been seen by infectious disease, Dr. [name], and he has placed the patient back on Ceftaz currently to be given to him at dialysis three times a week. 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 Fortunately there does not appear to be the evidence of infection at this point in time. Overall the patient has been tolerating the dressing changes without complication. We are no longer due lives in the cast of left foot secondary to the infection obviously. 08/10/18 on evaluation today patient appears to be doing rather well all things considered in regard to his left plantar foot ulcer. He does have a  significant callous on the lateral portion of his left foot he wonders if I can help clean that away to some degree today. Fortunately he is having no evidence of infection at this time which is good news. No fevers chills noted. With that being said his right plantar foot actually does have a significant callous buildup around the wound opening this seems to not be doing as well as I would like at this point. I do believe that he would benefit from sharp debridement at this site. Subsequently I think a total contact cast would be helpful for the right foot as well. 08/17/18; the patient arrived today with a total contact cast on the right foot. This actually looks quite good. He had gone to see Dr. Megan Salon of infectious disease about the osteomyelitis on the left foot. I think he is on IV Fortaz at dialysis although I am not exactly sure of the rationale for the Fortaz at the time of this dictation. He also arrived in today with a swelling on the lateral left foot this is the site of his original wounds in fact when I first saw this man when he was under Dr. Ardeen Garland care I think this was the site of where the wound was located. It was not particularly tender however using a small scalpel I opened it to [place] some moderate amount of purulent drainage. 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 Last MRI was in May that showed progressive osteomyelitis and bone destruction of the cuboid and base of the fifth metatarsal. Last week he had an abscess over this same area this was removed. Culture was negative. We are continuing with a total contact cast to the area on the third metatarsal head on the right and making some good progress here. The patient asked me again about renal transplant. He is not on the list because of the open wounds. 08/31/18; apparently the patient had  an interruption in the [place] but he is now back on this at dialysis. Apparently there was a misunderstanding and Dr. Crissie Figures orders. Due to have the MRI of the left foot tonight. The  area on the right foot continues to have callus thick skin and subcutaneous tissue around the wound edge that requires constant debridement however the wound is smaller we are using a total contact cast in this area. Alginate all wounds 09/07/18; without much surprise the MRI of his left foot showed osteomyelitis in the reminiscent of his fourth metatarsal but also the third metatarsal. She arrives in clinic today with the right foot and a total contact cast. There was purulent drainage coming out of the wound which I have cultured. Marked deterioration here with undermining widely around the wound orifice. Using pickups and a scalpel I remove callus and subcutaneous tissue from a substantial new opening. In a similar fashion the area on the left lateral foot that was blistered last week I have opened this and remove skin and subcutaneous tissue from this area to expose a obvious new wound. I think there is extension and communication between all of this on the left foot. The patient is on Fortaz at dialysis. Culture done of the right foot. We are clearly not making progress here. We have made him an appointment with Dr. [name] of orthopedics. I think an amputation of the left leg may be discussed. I don't think there is anything that can be done with foot salvage. 09/14/18; culture from the right foot last time showed a very resistant MRSA. This is resistant to doxycycline. I'm going to try to get linezolid at least for a week. He does not have a appointment with Dr. [name] yet. 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 I'll send a message to Dr. [name] of infectious disease. I want the patient to see Dr. [name] to go over the pros and cons of an  amputation which I think will be a BKA. The patient had a question about whether this is curative or not. I told him that I thought it would be although spread of staph aureus infection is not unheard of. MRI of the right foot was done at the end of April 2019 did not show osteomyelitis. The patient last saw Dr. [name] on 9/3. I'll send Dr. [name] a message. I would really like him to weigh the pros and cons of a BKA on the left otherwise he'll probably need IV antibiotics and perhaps hyperbaric oxygen again. He has not had a good response to this in the past. His MRI earlier this month showed progressive damage in the remnants of the fourth and third metatarsal 09/21/2018; sees Dr. [name] of orthopedics next week to discuss a left BKA in response to the osteomyelitis in the left foot. Using silver alginate to all wound areas. He is completing the Zyvox I did put in for him after last culture showed MRSA 09/29/2018; patient saw Dr. [name] of orthopedics discussed the osteomyelitis in the left foot third and fourth metatarsals. He did not recommend urgent surgery but certainly stated the only surgical option would be a BKA. He is communicating with Dr. [name]. The problem here is the instability of the areas on his foot which constantly generate draining abscesses. I will communicate with Dr. [name] about this. He has an appointment on 10/23 10/05/2018; patient sees Dr. [name] on 10/23. I have sent him a secure message not ordered any additional antibiotics for now. Patient continues to have 2 open areas on the left plantar foot at the fifth metatarsal head and the lateral aspect of the foot. These have  not changed all that much. The area on the right third met head still has thick callus and surrounding subcutaneous tissue we have been using silver alginate 10/12/2018; patient sees Dr. [name] or colleague on 10/23. 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 The area on the right plantar foot is just about closed. The superior wound on the left fifth metatarsal head is callused over but I am not sure if this is closed. The area below it is about the same. Considerable amount of callus on the lateral foot. We have been using silver alginate to the wounds 10/19/2018; the patient saw Dr. [name] on 10/22. He wishes to try and continue to save the left foot. He has been given 6 weeks of oral ciprofloxacin. We continue to put a total contact cast on the right foot. The area over the fifth metatarsal head on the left is callused over/perhaps healed but I did not remove the callus to find out. He still has the wound on the left lateral midfoot requiring debridement. We have been using silver alginate to all wound areas 10/26/2018 On the left foot our intake nurse noted some purulent drainage from the inferior wound [currently the only one that is not callused over] On the right foot even though he is in total contact cast a considerable amount of thick black callus and surface eschar. On this side we have been using silver alginate under a total contact cast. he remains on ciprofloxacin as prescribed by Dr. [name] 11/02/2018; culture from last week which is done of the probing area on the left midfoot wound showed MRSA "few". I am going to need to contact Dr. [name] which I will do today I think he is going to need IV antibiotics again. The area on the left foot which was callused on the side is clearly separating today all of this was removed a copious amounts of callus and necrotic subcutaneous tissue. The area on the right plantar foot actually remained healthy looking with a healthy granulated base. We are using silver alginate to all wound areas 11/09/2018; unfortunately neither 1 of the patient's wounds areas looks at all satisfactory. On the left he has considerable necrotic debris from the plantar wound laterally over  the foot. I removed copious amounts of material including callus skin and subcutaneous tissue. On the right foot he arrives with undermining and frankly purulent drainage. Specimen obtained for culture and debridement of the callused skin and subcutaneous tissue from around the circumference. Because of this I cannot put him back in a total contact cast but to be truthful we are really unfortunately not making a lot of progress I did put in a secure text message to Dr. [name] wondering about the MRSA on the left foot. He suggested vancomycin although this is not started. I was left wondering if he expected me to send this into dialysis. Has we have more purulence on the right side wait for that result before calling dialysis 11/16/2018; I called dialysis earlier this week to get the vancomycin started. I think he got the first dose on Tuesday. The patient tells me that he fell earlier this week twisting his left foot and ankle and he is swelling. He continues to not look well. He had blood cultures done at dialysis on Tuesday he is not been informed of the results. 12/07/2018; the patient was admitted to hospital from 11/22/2018 through 12/02/2018. He underwent a left  BKA. Apparently had staph sepsis. In the meantime being off the right foot this is closed over. He follows up with Dr. 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 Subsequent to this he actually had multiple falls in fact he tells me to following which calls the wound to be his apparently. He has been seeing Dr. [name] and his physician assistant in the interim. They actually did want him to come back for reevaluation to see if there's anything we can do for me wound care perspective the help this area to heal more appropriately. The patient states that he does not have a tremendous amount of pain which is good news. No  fevers, chills, nausea, or vomiting noted at this time. We have gotten approval from Dr. [name] office had [place] for [place] to treat the patient for his stop wound. This is due to the fact that the patient was in the 90 day postop global. 1/21; the patient does not have an open wound on the right foot although he does have some pressure areas that will need to be padded when he is transferring. The dehisced surgical wound looks clean although there is some undermining of 1.5 cm superiorly. 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 This appears healthy but there is undermining. TheraSkin #1 2/11; TheraSkin #2 2/25; TheraSkin #3. Wound is measuring smaller 3/10; TheraSkin #4 wound is measuring smaller 3/24; TheraSkin #5 wound is measuring smaller and looks healthy. 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 He has no open area on the right foot although he does have some callused area over the original third metatarsal head wound. The third metatarsal is also subluxed on the right foot. I have warned him today that he cannot consider wearing a prosthesis for at least a month but he can go for measurements. He is going to need to keep the right foot padded is much as possible in his diabetic shoe indefinitely READMISSION 06/12/2019 Mr. Underdown is a type II diabetic on dialysis. He has been in this clinic multiple times with wounds on his bilateral lower extremities. 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 He also has a history of wounds on his right foot but tells me that everything is going well here. He actually came in here with his prosthesis walking with a cane. We were all really quite gratified to see this He states over the last several weeks he has had a painful area at the  tip of the left third finger. His dialysis shunt in his is in the left upper arm and this particularly hurts during dialysis when his fingers get numb and there is a lot of pain in the tip of his left third finger. I believe he is seen vascular surgery. He had a Doppler done to evaluate for a dialysis steal syndrome. He is going for a banding procedure 1 week from today towards the left AV fistula. I think if that is unsuccessful they will be looking at creating a new shunt. He had a course of doxycycline when he which he finished about a week ago. He has been using Bactroban to the finger 6/25; the patient  had his banding procedure and things seem to be going better. He is not having pain in his hand at dialysis. The area on the tip of his finger seems about closed. He did however have a paronychia I the last time I saw him. I have been advising him to use topical Bactroban washing the finger. Culture I did last time showed a few staph epidermidis which I was willing to dismiss as superficial skin contaminant. He arrives today with the finger wound looking better however the paronychia a seems worse 7/9; 2-week follow-up. He does not have an open wound on the tip of his third left finger. I thought he had an initial ischemic wound on the tip of the finger as well as a paronychia a medially. All of this appears to be closed. 8/6-Patient returns after 3 weeks for follow-up and has a right second met head plantar callus with a significant area of maceration hiding an ulcer ABI repeated today on right - 1.26 8/13-Patient returns at 1 week, the right second met head plantar wound appears to be slightly better, it is surrounded by the callus area we are using silver alginate 8/20; the patient came back to clinic 2 weeks ago with a wound on the right second metatarsal head. He apparently told me he developed callus in this area but also went away from his custom shoes to a pair of running shoes. Using  silver alginate. 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 There might be much we can do to offload this although the patient tells me he has a wheelchair that he is using to try and stay off the wound is much as possible 8/27; right second metatarsal head. Still small punched out area with thick callus and subcutaneous tissue around the wound. We have been using silver collagen. This is always been an issue with this man's wound especially on this foot 9/3; right second met head. Still the same punched-out area with thick callus and subcutaneous tissue around the wound removing the circumference demonstrates repetitively undermining area. I have removed all the subcutaneous tissue associated with this. We have been using silver collagen 9/15; right second metatarsal head. Same punched-out thick callus and subcutaneous tissue around the wound area. Still requiring debridement we have been using silver collagen I changed back to silver alginate X-ray last time showed no evidence of osteomyelitis. Culture showed Klebsiella and he was started and Keflex apparently just 4 days ago 9/24; right second metatarsal head. He is completed the antibiotics I gave him for 10 days. Same punched-out thick callus and subcutaneous tissue around the area. Still requiring debridement. We have been using silver alginate. The area itself looks swollen and this could be just [place] that comes on this area with walking on a prosthesis on the left however I think he needs an MRI and I am going to order that today. There is not an option to offload this further 10/1; right second plantar metatarsal head. MRI booked for October 6. Generally looking better today using silver alginate 10/8; right second plantar metatarsal head. MRI that was done on 10/6 was negative for osteomyelitis noted prior amputations of the second and fourth toes at the MTP. Prior amputation of the third toe to the base of the  middle phalanx. Prior amputation of the fifth toe to the head of the metatarsal. 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 He does not  recall about hearing about this. He did not have any pain but then again he has reduced sensation from diabetic neuropathy 10/15; right second plantar metatarsal head. Still in the same condition callus nonviable subcutaneous tissue which cleans up nicely with debridement but reforms by the next week. The patient tells me that he is offloading this is much as he can including taking his wheelchair to dialysis. We have been using silver alginate, changed to silver collagen today 10/22; right second plantar metatarsal head. Wound measures smaller but still thick callus around this wound. Still requiring debridement Electronic Signature(s) Signed: 10/18/2019 6:29:04 PM By: Linton Ham MD Entered By: Linton Ham on 10/18/2019 16:44:25 -------------------------------------------------------------------------------- Physical Exam Details Patient Name: Date of Service: [place], [place] 10/18/2019 3:00 PM Medical Record [id number] Patient Account Number: [id number] Date of Birth/Sex: Treating RN: 12-02-1951 (68 y.o. 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 Pulse regular and within target range for patient.Marland Kitchen Respirations regular, non-labored and within target range.. Temperature is normal and within the target range for the patient.Marland Kitchen Appears in no distress. Notes Wound exam; debridement of callus and subcutaneous tissue around the wound margin this looks smaller and healthier. Clean base post debridement. Electronic Signature(s) Signed: 10/18/2019 6:29:04 PM By: Linton Ham  MD Entered By: Linton Ham on 10/18/2019 16:46:40 -------------------------------------------------------------------------------- Physician Orders Details Patient Name: Date of Service: TYMIER, LINDHOLM 10/18/2019 3:00 PM Medical Record [id number] Patient Account Number: [id number] Date of Birth/Sex: Treating RN: 1951/08/22 (68 y.o. 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 - Thursday Nurse Visit: - next week. Dressing Change Frequency Wound #41 Right Metatarsal head second Change Dressing every other day. Skin Barriers/Peri-Wound Care Barrier cream - as needed for maceration, wetness, or redness. Wound Cleansing May shower and wash wound with soap and water. Primary Wound Dressing Wound #41 Right Metatarsal head second Silver Collagen - moisten with hydrogel ([place] jelly) or saline Secondary Dressing Wound #41 Right Metatarsal head second Kerlix/Rolled Gauze Dry Gauze Other: - felt to periwound and place one below wound. lightly wrap coban to hold dressing in place. felt / pad wound Edema Control Avoid standing for long periods of time Elevate legs to the level of the heart or above for 30 minutes daily and/or when sitting, a frequency of: - throughout the day. Off-Loading Other: - felt to periwound and place one below wound. Additional Orders / Instructions Other: - minimize walking and standing on foot to aid in wound healing and callus buildup. Electronic Signature(s) Signed: 10/18/2019  6:29:04 PM By: Linton Ham MD Signed: 10/18/2019 6:35:09 PM By: Deon Pilling Entered By: Deon Pilling on 10/18/2019 16:08:12 -------------------------------------------------------------------------------- Problem List Details Patient Name: Date of Service: RAYN, ENDERSON 10/18/2019 3:00 PM Medical Record [id number] Patient Account Number: [id number] Date of Birth/Sex: Treating RN: Feb 03, 1951 (68 y.o. 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, 06/12/2019 No Yes other extremity Inactive Problems ICD-10 Code Description Active Date Inactive Date L03.114 Cellulitis of left upper limb 06/12/2019 06/12/2019 L98.498 Non-pressure chronic ulcer of skin of other sites with other 06/12/2019 06/12/2019 specified severity S61.209D Unspecified open wound of unspecified finger without damage 06/12/2019 06/12/2019 to nail, subsequent encounter Resolved Problems Electronic Signature(s) Signed: 10/18/2019 6:29:04 PM By: Linton Ham MD Entered By: Linton Ham on 10/18/2019 16:42:31 -------------------------------------------------------------------------------- Progress Note Details Patient Name: Date of Service: HEZZIE, KARIM 10/18/2019 3:00 PM Medical Record [id number] Patient Account Number: [id number] Date of Birth/Sex: Treating RN: [birth year] (68 y.o. [name] Primary Care Provider: [name] Other Clinician: Referring Provider: Treating Provider/Extender:Miarose Lippert, Jeronimo Greaves, Buena Irish in  Treatment: 18 Subjective History of Present Illness (HPI) The following HPI elements were documented for the patient's wound: Location: Patient presents with a wound to bilateral feet. Quality: Patient reports experiencing essentially no pain. Severity: Mildly severe wound with no evidence of infection Duration: Patient has had the wound for greater than 2 weeks prior to presenting for treatment The patient is a pleasant 68 yrs old bm here for evaluation of ulcers on the plantar aspect of both feet. 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 He has a left arm graft for access. He has been trying to stay off his feet for weeks but does not seem to have any improvement in the wound. He has been seeing someone at the foot center and was referred to the Wound Care center for further evaluation. 12/30/15 the patient has 3 wounds one over the right first metatarsal head and 2 on the left foot at the left fifth and lleft first metatarsal head. All of these look relatively similar. The one over the left fifth probe to bone I could not prove that any of the others did. I note his MRI in November that did not show osteomyelitis. His peripheral pulses seem robust. All of these underwent surgical debridement to remove callus nonviable skin and subcutaneous tissue 01/06/16; the patient had his sutures removed from the right fifth ray amputation. There may be a small open part of this superiorly but otherwise the incision looks good. Areas over his right first, left first and left fifth metatarsal head all underwent surgical debridement as a varying degree of callus, skin and nonviable subcutaneous tissue. The area that is most worrisome is the right fifth metatarsal head which has a wound probes precariously close to bone. There is no purulent drainage or erythema 01/13/16; I'm not exactly sure of the status of the right fifth ray amputation site however he  follows with Dr. [name] later this week. The area over his right first plantar metatarsal head, left first and fifth plantar metatarsal head are all in the same status. Thick circumferential callus, nonviable subcutaneous tissue. Culture of the left fifth did not culture last week 01/20/16; all of the patient's wounds appear and roughly the same state although his amputation site on the right lateral foot looks better. His wounds over the right first, left first and left fifth metatarsal heads all underwent difficult surgical debridement removing circumferential callus nonviable skin and subcutaneous tissue. There is no overt evidence of infection in these areas. MRI at the end of December of the left  foot did not show osteomyelitis, right foot showed osteomyelitis of the right fifth digit he is now status post amputation. 01/27/16 the patient's wounds over his plantar first and fifth metatarsal heads on the left all appear better having been started on a total contact cast last week. They were debridement of circumferential callus and nonviable subcutaneous tissue as was the wound over the first metatarsal head. His surgical incision on the right also had some light surface debridement done. 03/23/2016 -- the patient was doing really well and most of his wounds had almost completely healed but now he came back today with a history of having a discharge from the area of his right foot on the plantar aspect and also between his first and second toe. He also has had some discharge from the left foot. Addendum: I spoke to the PA [name] at the dialysis center whose fax number is [phone]. We discussed the infection the patient has and she will put the patient on vancomycin and Fortaz until the final culture report is back. We will fax this as soon as available. 03/30/2016 -- left foot x-ray IMPRESSION:No definitive osteomyelitis noted. X-ray of the right foot -- IMPRESSION: 1. Soft  tissue swelling. Prior amputation right fifth digit. No acute or focal bony abnormality identified. If osteomyelitis remains a clinical concern, MRI can be obtained. 2. Peripheral vascular disease. His culture reports have grown an MSSA -- and we will fax this report to his hemodialysis center. He was called in a prescription of oral doxycycline but I have told her not to fill this in as he is already on IV antibiotics. 04/06/2016 -- a few days ago, I spoke to the hemodialysis center nurse who had stopped the IV antibiotics and he was given a prescription for doxycycline 100 mg by mouth twice a day for a week and he is on this at the present time. 05/11/2016 -- he has recently seen his PCP this week and his hemoglobin A1c was 7. He is working on his paperwork to get his orthotic shoes. 05/25/2016 -- -- x-ray of the left foot IMPRESSION:No acute bony abnormality. No radiographic changes of acute osteomyelitis. No change since prior study. X-ray of the right foot -- IMPRESSION: Postsurgical changes are seen involving the fifth toe. No evidence of acute osteomyelitis. he developed a large blister on the medial part of his right foot and this opened out and drain fluid. 06/01/2016 -- he has his MRI to be done this afternoon. 06/15/2016 -- MRI of the left forefoot without contrast shows 2 separate regions of cutaneous and subcutaneous edema and possible ulceration and blistering along the ball of the foot. No obvious osteomyelitis identified. MRI of the right foot showed cutaneous and subcutaneous thickening plantar to the first digit sesamoid with an ulcer crater but no underlying osteomyelitis is identified. 06/22/2016 -- the right foot plantar ulcer has been draining a lot of seropurulent material for the last few days. 06/30/2016 -- spoke to the dialysis center and I believe I spoke to [place] a PA at the center who discussed with me and agreed to putting Legrand Como on vancomycin until his cultures  arrive. On review of his culture report no WBCs were seen or no organisms were seen and the culture was reincubated for better growth. The final report is back and there were no predominant growth including Streptococcus or Staphylococcus. Clinically though he has a lot of drainage from both wounds a lot of undermining and there is further blebs on  the left foot towards the interspace between his first and second toe. 08/10/2016 -- the culture from the right foot showed normal skin flora and there was no Staphylococcus aureus was group A streptococcus isolated. 09/21/2016 -- -- MRI of the right foot was done on 09/13/2016 - IMPRESSION: Findings most consistent with acute osteomyelitis throughout the great toe, sesamoid bones and plantar aspect of the head of the first metatarsal. Fluid in the sheath of the flexor tendon of the great toe could be sympathetic but is worrisome for septic tenosynovitis. First MTP joint effusion worrisome for septic joint. He was admitted to the hospital on 09/11/2016 and treated for a fever with vancomycin and cefepime. He was seen by Dr. Bobby Rumpf of infectious disease who recommended 6 weeks treatment with vancomycin and ceftazidime with his hemodialysis and to continue to see as in the wound clinic. Vascular consult was pending. The patient was discharged home on 09/14/2016 and he would continue with IV antibiotics for 6 weeks. 09/28/16 wound appears reasonably healthy. Continuing with total contact cast 10/05/16 wound is smaller and looking healthy. Continue with total contact cast. He continues on IV antibiotics 10/12/2016 -- he has developed a new wound on the dorsal aspect of his left big toe and this is a superficial injury with no surrounding cellulitis. He has completed 30 days of IV antibiotics and is now ready to start his hyperbaric oxygen therapy as per his insurance company's recommendation. 10/19/2016 -- after the cast was removed on the right  side he has got good resolution of his ulceration on the right plantar foot. he has a new wound on the left plantar foot in the region of his fourth metatarsal and this will need sharp debridement. 10/26/2016 -- Xray of the right foot complete: IMPRESSION: Changes consistent with osteomyelitis involving the head of the first metatarsal and base of the first proximal phalanx. The sesamoid bones are also likely involved given their positioning. 11/02/2016 -- there still awaiting insurance clearance for his hyperbaric oxygen therapy and hopefully he will begin treatment soon. 11/09/2016 -- started with hyperbaric oxygen therapy and had some barotrauma to the right ear and this was seen by ENT who was prescribed Afrin drops and would probably continue with HBO and he is scheduled for myringotomy tubes on Friday 11/16/2016 -- he had his myringotomy tubes placed on Friday and has been doing much better after that with some fluid draining out after hyperbaric treatment today. The pain was much better. 11/30/2016 -- over the last 2 days he noticed a swelling and change of color of his right second toe and this had been draining minimal fluid. 12/07/2016 -- x-ray of the right foot -- IMPRESSION:1. Progressive ulceration at the distal aspect of the second digit with significant soft tissue swelling and osseous changes in the distal phalanx compatible with osteomyelitis. 2. Chronic osteomyelitis at the first MTP joint. 3. Ulcerations at the second and third toes as well without definite osseous changes. Osteomyelitis is not excluded. 4. Fifth digit amputation. On 12/01/2016 oo I spoke to Dr. Bobby Rumpf, the infectious disease specialist, who kindly agreed to treat this with IV antibiotics and he would call in the order to the dialysis center and this has been discussed in detail with the patient who will make the appropriate arrangements. The patient will also book an appointment as soon as  possible to see Dr. [name] in the office. Of note the patient has not been on antibiotics this entire week as the dialysis center  did not receive any orders from Dr. [name]. I got in touch with Dr. [name] who tells me the patient has an appointment to see him this coming Wednesday. 12/14/2016 -- the patient is on ceftazidime and vancomycin during his dialysis, and I understand this was put on by his nephrologist Dr. Raliegh Ip possibly after speaking with infectious disease Dr. [name] 12/23/16; patient was on my schedule today for a wound evaluation as he had difficulties with his schedule earlier this week. I note that he is on vancomycin and ceftazidine at dialysis. In spite of this he arrives today with a new wound on the base of the right second toe this easily probes to bone. The known wound at the tip of the second toe With this area. He also has a superficial area on the medial aspect of the third toe on the side of the DIP. This does not appear to have much depth. The area on the plantar left foot is a deep area but did not probe the bone 12/28/16 -- he has brought in some lab work and the most recent labs done showed a hemoglobin of 12.1 hematocrit of 36.3, neutrophils of 51% WBC count of 5.6, BN of 53, albumin of 4.1 globulin of 3.7, vancomycin 13 g per mL. 01/04/2017 -- he saw Dr. [name] who has recommended a amputation of the right second toe and he is awaiting this date. He sees Dr. Bobby Rumpf of infectious disease tomorrow. The bone culture taken on 12/28/2016 had no growth in 2 days. 01/11/2017 -- was seen by Dr. Bobby Rumpf regarding the management and has recommended a eval by vascular surgeons. He recommended to continue the antibiotics during his hemodialysis. Xray of the left foot -- IMPRESSION: No acute fracture, dislocation, or osseous erosion identified. 01/18/2017 -- he has his vascular workup later today and he is going to have his right foot second toe  amputation this coming Friday by Dr. [name]. We have also put in for a another 30 treatments with hyperbaric oxygen therapy 01/25/2017 -- I have reviewed Dr. Donnetta Hutching is vascular report from last week where he reviewed him and thought his vascular function was good enough to heal his amputation site and no further tests were recommended. He also had his orthopedic related to surgery which is still pending the notes and we will review these next week. 02/01/2017 -- the operative remote of Dr. 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 02/08/2017 --he was seen by Dr. [name] on 02/03/2017 -- patient is on [place]. after a thorough review he had recommended to continue with antibiotics during hemodialysis. The patient was seen by Dr. [name], but I do not find any follow-up note on the electronic medical record. he has removed the dressing over the right foot to remove the sutures and asked him to see him back in a month's time 02/22/2017 -- patient has been febrile and has been having symptoms of the upper respiratory tract infection but has not been checked for the flu and it's been over 4 days now. He says he is feeling better today. Some drainage between his left first and second toe and this needed to be looked at  03/08/2017 -- was seen by Dr. Bobby Rumpf on 03/07/2017 after review he will stop his antibiotics and see him back in a month to see if he is feeling well. 03/15/2017 -- he has completed his course of hyperbaric oxygen therapy and is doing fine with his health otherwise. 03/22/2017 -- his nutritionist at the dialysis center  has recommended a protein supplement to help build his collagen and we will prescribe this for him when he has the details 05/03/2017 -- he recently noticed the area on the plantar aspect of his fifth metatarsal head which opened out and had minimal drainage. 05/10/2017 -- -- x-ray of the left foot -- IMPRESSION:1. No convincing conventional radiographic evidence of active osteomyelitis.2. Active soft tissue ulceration at the tip of the fifth digit, and at the lateral aspect of the foot adjacent the base of the fifth metatarsal. 3. Surgical changes of prior fourth toe amputation. 4. 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 5. Small vessel atherosclerotic vascular calcifications. 6. 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 ======= Readmission after 5 weeks: 06/15/2017 -- the patient returns after 5 weeks having had an MRI done on 05/17/2017 MRI of the left foot without contrast showed soft tissue ulcer overlying the base of the fifth metatarsal. 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 No drainable fluid collection to suggest an abscess. He was in hospital between 05/16/2017 and 05/25/2017 -- and on discharge was asked to follow-up with Dr. [name] and Dr. [name]. He was treated 2 weeks post discharge with vancomycin plus ceftriaxone with hemodialysis and oral metronidazole 500 mg 3 times a day. 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 last hemoglobin A1c was 6.7% He had a postoperative MR of the foot on 05/23/2017 -- which showed no new areas of cortical bone loss, edema or soft tissue ulceration to suggest osteomyelitis. the patient has completed his course of IV antibiotics during dialysis and is to see the infectious disease doctor tomorrow. Dr. [name] had seen him after suture  removal and asked him to keep the wound with a dry dressing 06/21/2017 -- he was seen by Dr. Bobby Rumpf of infectious disease on 06/16/2017 -- he stopped his Flagyl and will see him back in 6 weeks. He was asked to follow-up with Dr. [name] and with the wound clinic clinic. 07/05/17; bone biopsy from last time showed acute osteomyelitis. This is from the reminiscent in the left fifth metatarsal. Culture result is apparently still pending [holding for anaerobe}. From my understanding in this case this is been a progressive necrotic wound which is deteriorated markedly over the last 3 weeks since he returned here. He now has a large area of exposed bone which was biopsied and cultured last week. Dr. Graylon Good has put him on vancomycin and Fortaz during his hemodialysis and Flagyl orally. He is to see Dr. [name] next week 07/19/2017 -- the patient was reviewed by Dr. [name] of orthopedics who reviewed the case in detail and agreed with the plan to continue with IV antibiotics, aggressive wound care and hyperbaric oxygen therapy. He would see him back in 3 weeks' time 08/09/2017 -- saw Dr. Bobby Rumpf on 08/03/2017 -he was restarted on his antibiotics for 6 weeks which included vanco, ceftaz and Flagyl. he recommended continuing his antibiotics for 6 weeks and reevaluate his completion date. He would  continue with wound care and hyperbaric oxygen therapy. 08/16/2017 -- I understand he will be completing his 6 weeks of antibiotics sometime later this week. 09/19/2017 -- he has been without his wound VAC for the last week due to lack of supplies. He has been packing his wound with silver alginate. 10/04/2017 -- he has an appointment with Dr. [name] tomorrow and hence we will not apply the wound VAC after his dressing changes today. 10/11/2017 -- he was seen by Dr. Bobby Rumpf on 10/05/2017, noted that the patient was currently off antibiotics, and after thorough review he recommended he  follow-up with the wound care as per plan and no further antibiotics at this point. 11/29/17; patient is arrived for the wound on his left lateral foot.Marland Kitchen He is completed IV antibiotics and 2 rounds of hyperbaric oxygen for treatment of underlying osteomyelitis. He arrives today with a surface on most of the wound area however our intake nurse noted drainage from the superior aspect. This was brought to my attention. 12/13/2017 -- the right plantar foot had a large bleb and once the bleb was opened out a large callus and subcutaneous debris was removed and he has a plantar ulcer near the fourth metatarsal head 12/28/17 on evaluation today patient appears to be doing acutely worse in regard to his left foot. The wound which has been appearing to do better as now open up more deeply there is bone palpable at the base of the wound unfortunately. He tells me that this feels "like it did when he had osteomyelitis previously" he also noted that his second toe on the left foot appears to be doing worse and is swollen there does appear to be some fluid collected underneath. His right foot plantar ulcer appears to be doing somewhat better at this point and there really is no complication at the site currently. No fevers, chills, nausea, or vomiting noted at this time. Patient states that he normally has no pain at this site however. Today he is having significant pain. 01/03/18; culture done last week showed methicillin sensitive staph aureus and group B strep. He was on Septra however he arrives today with a fever of 101. He has a functional dialysis shunt in his left arm but has had no pain here. No cough. He still makes some urine no dysuria he did have abdominal pain nausea and diarrhea over the weekend but is not had any diarrhea since yesterday. Otherwise he has no specific complaints 01/05/18; the patient returns today in follow-up for his presentation from 1/8. At that point he was febrile. I gave  him some Levaquin adjusted for his dialysis status. He tells me the fever broke that night and it is not likely that this was actually a wound infection. He is gone on to have an MRI of the left foot; this showed interval development of an abnormal signal from the base of the third and fourth metatarsal and in the cuboid reminiscent consistent with osteomyelitis. There is no mention of the left second toe I think which was a concern when it was ordered. The patient is taking his Levaquin 01/10/18; the patient has completed his antibiotics today. The area on the left lateral foot is smaller but it still probes easily to bone. He has underlying osteomyelitis here and sees Dr. Megan Salon of infectious disease this week ooThe area on the right third plantar metatarsal head still requires debridement not much change in dimensions [facility] has a new wound on the medial tip of  his right third toe again this probes to bone. He only noticed this 2 days ago 01/17/18; the patient saw Dr. [name] last week and he is back on Vanco and Fortaz at dialysis. 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 We are previously seeing him for a wound also on the base of the fourth med head on the right. X- ray of this area did not show osteomyelitis in relation to a new wound on the tip of his right third toe. Again this probes to bone. Finally his second toe on the left which didn't really show anything on the MRI at least no report appears to have a separated cutaneous area which I think is going to come right off the tip of his toe and leave exposed bone. Whether this is infectious or ischemic I am not clear Culture I did from the third toe last week which was his new wound was negative. Plain x-ray on the right foot did not show osteomyelitis in the toes 01/24/18; the patient is going went to see Dr. [name]. He is  going to have a amputation of the left second and right third toe although paradoxically the left second toe looks better than last week. We have treating a deep probing wound on the left lateral foot and the area over the third metatarsal head on the right foot. 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 Also a debridement including bone of the left lateral foot and a closure which is still sutured. This is a bit surprising. Also apparent debridement of the base of the right third toe wound. 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 I'm not sure exactly where we are in the frame of things here. The wound on the left lateral foot is still sutured also the amputation sites of the left second and right third toe. He is been using Xeroform to the sutured areas silver alginate to the right foot 02/14/18; he continues on Rocephin at dialysis for osteomyelitis. I still don't have a good sense of where we are in the treatment duration. The wounds on the left lateral foot had the sutures removed and this clearly still probes deeply. We debrided the area with a #5 curet. We'll use silver alginate to the wound oothe area over the right third met head also required debridement of callus skin and subcutaneous tissue and necrotic debris over the wound surface. This tunnels superiorly but I did not unroofed this today. ooThe areas on the tip of his toes have a crusted surface eschar I did not debridement this either. 02/21/18; he continues on Rocephin at dialysis for osteomyelitis. I don't have a good sense of where we are and the treatment duration. The wounds on the left lateral foot is callused over on presentation but requires debridement. The area on the right third med head  plantar aspect actually is measuring smaller 02/28/18;patient is on Vanco and Fortaz at dialysis, I'm not sure why I thought this was Rocephin above unless it is been changed. I don't have a good sense of time frame here. Using silver collagen to the area on the left lateral  foot and right third med head plantar foot 03/07/18; patient is completing bank and Fortaz at dialysis soon. He is using Silver collagen to the area on the left lateral foot and the right third metatarsal head. He has a smaller superficial wound distal to the left lateral foot wound. 03/14/18-he is here in follow-up evaluation for multiple ulcerations to his bilateral feet. He presents with a new ulceration to the plantar aspect of the left foot underneath the blister, this was deroofed to reveal a partial thickness ulcer. He is voicing no complaints or concerns, tolerated dialysis yesterday. We will continue with same treatment plan and he will follow-up next week 03/21/18; the patient has a area on the lateral left foot which still has a small probing area. The overall surface area of the wound is better. He presented with a new ulceration on the left foot plantar fifth metatarsal head last week. The area on the right third metatarsal head appears smaller.using silver alginate to all wound areas. The patient is changing the dressing himself. He is using a Darco forefoot offloading are on the right and a healing sandal on the left 03/28/18; original wound left lateral foot. Still nowhere close to looking like a heeling surface oosecondary wound on the left lateral foot at the base of his question fifth metatarsal which was new last week ooRight third metatarsal head. ooWe have been using silver alginate all wounds 04/04/18; the original wound on the left lateral foot again heavy callus surrounding thick nonviable subcutaneous tissue all requiring debridement. The new wound from 2 weeks ago just near this at the base of the fifth  metatarsal head still looks about the same ooUnfortunately there is been deterioration in the third medical head wound on the right which now probes to bone. I must say this was a superficial wound at one point in time and I really don't have a good frame of reference year. I'll have to go back to look through records about what we know about the right foot. 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 He is also been to see Dr. [name]. Somewhere in here as somebody is ordered a VASCULAR evaluation which was done on 03/22/18. On the right is anterior tibial artery was monophasic triphasic at the posterior tibial artery. On the left anterior tibial artery monophasic posterior tibial artery monophasic. ABI in the right was 1.43 on the left 1.21. TBIs on the right at 0.41 and on the left at 0.32. A vascular consult was recommended and I think has been arranged. 04/11/18; Mr. Blaustein has not had an MRI of the right foot since 2017. Recent x-ray of the right foot done in January and February was negative. However he has had a major deterioration in the wound over the third met head. He is completed antibiotics last week at dialysis [place]. I think he is going to see vascular in follow-up. The areas on the left lateral foot and left plantar fifth metatarsal head both look satisfactory. I debrided both of these areas although the tissue here looks good. The area on the left lateral foot once probe to bone it certainly does not do that now 04/18/18; MRI of the right foot is on Thursday. He has what looks to be serosanguineous purulent drainage coming out of the wound over the right third metatarsal head today. He completed antibiotics bank and Fortaz 2 weeks ago at  dialysis. He has a vascular follow-up with regards to his arterial insufficiency although I don't exactly see when that is booked. The areas  on the left foot including the lateral left foot and the plantar left fifth metatarsal head look about the same. 04/25/18-He is here in follow-up evaluation for bilateral foot ulcers. MRI obtained was negative for osteomyelitis. Wound culture was negative. We will continue with same treatment plan he'll follow-up next week 05/02/18; the right plantar foot wound over the third metatarsal head actually looks better than when I last saw this. His MRI was negative for osteomyelitis wound culture was negative. ooOn the left plantar foot both wounds on the plantar fifth metatarsal head and on the lateral foot both are covered in a very hard circumferential callus. 05/09/18; right plantar foot wound over the third metatarsal head stable from last week. ooLeft plantar foot wound over the fifth metatarsal head also stable but with callus around both wound areas ooThe area on the left lateral foot had thick callus over a surface and I had some thoughts about leaving this intact however it felt boggy. ooWe've been using silver alginate all wounds 05/16/18; since the patient was last here he was hospitalized from 5/15 through 5/19. 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 This grew MRSA. He was placed on vancomycin.MRI of the left foot suggested "progressive" osteomyelitis and bone destruction of the cuboid and the base of the fifth metatarsal. Stable erosive changes of the base of the second metatarsal. I'm wondering if they're aware that he had surgery and debridement in the area of the underlying bone previously by Dr. [name]. In any case, He was also revascularized with an angioplasty and stenting of the left tibial peroneal trunk and angioplasty of the left posterior tibial artery. He is on vancomycin at dialysis. He has a 2 week follow-up with infectious disease. He has vascular surgery follow-up.  He was started on Plavix. 05/23/18; the patient's wound on the lateral left foot at the level of the fifth metatarsal head and the plantar wound on the plantar fifth metatarsal head both look better. The area on the right third metatarsal head still has depth and undermining. We've been using silver alginate. He is on vancomycin. He has been revascularized on the left 05/30/18; he continues on vancomycin at dialysis. Revascularized on the left. The area on the left lateral foot is just about closed. Unfortunately the area over the plantar fifth metatarsal head undermining superiorly as does the area over the right third metatarsal head. Both of these significantly deteriorated from last week. He has appointments next week with infectious disease and orthopedics I delayed putting on a cast on the left until those appointments which therefore we made we'll bring him in on Friday the 14th with the idea of a cast on the left foot 06/06/18; he continues on vancomycin at dialysis. Revascularized on the left. He arrives today with a ballotable swelling just above the area on the left lateral foot where his previous wound was. He has no pain but he is reasonably insensate. The difficult areas on the plantar left fifth metatarsal head looks stable whereas the area on the right third plantar metatarsal head about the same as last week there is undermining here although I did not unroofed this today. He required a considerable debridement of the swelling on the left lateral foot area and I  unroofed an abscess with a copious amount of brown purulent material which I obtained for culture. I don't believe during his recent hospitalization he had any further imaging although I need to review this. Previous cultures from this area done in this clinic showed MRSA, I would be surprised if this is not what this is currently even though he is on vancomycin. 06/13/18; he continues on vancomycin at dialysis however he  finishes this on Sunday and then graduates the doxycycline previously prescribed by Dr. [name]. The abscess site on the lateral foot that I unroofed last time grew moderate amounts of methicillin-resistant staph aureus that is both vancomycin and tetracycline sensitive so we should be okay from that regard. He arrives today in clinic with a connection between the abscess site and the area on the lateral foot. We've been using silver alginate to all his wound areas. The right third metatarsal head wound is measuring smaller. Using silver alginate on both wound areas 06/20/18; transitioning to doxycycline prescribed by Dr. [name]. The abscess site on the lateral foot that I unroofed 2 weeks ago grew MRSA. That area has largely closed down although the lateral part of his wound on the fifth metatarsal head still probes to bone. I suspect all of this was connected. ooOn the right third metatarsal head smaller looking wound but surrounded by nonviable tissue that once again requires debridement using silver alginate on both areas 06/27/18; on doxycycline 100 twice a day. The abscess on the lateral foot has closed down. Although he still has the wound on the left fifth metatarsal head that extends towards the lateral part of the foot. The most lateral part of this wound probes to bone ooRight third metatarsal head still a deep probing wound with undermining. Nevertheless I elected not to debridement this this week ooIf everything is copious static next week on the left I'm going to attempt a total contact cast 07/04/18; he is tolerating his doxycycline. The abscess on the left lateral foot is closed down although there is still a deep wound here that probes to bone. We will use silver collagen under the total contact cast Silver collagen to the deep wound over the right third metatarsal head is well 07/11/18; he is tolerating doxycycline. The left lateral fifth plantar metatarsal head still probes  deeply but I could not probe any bone. We've been using silver collagen and this will be the second week under the total contact cast ooSilver collagen to deep wound over the right third metatarsal head as well 07/18/18; he is still taking doxycycline. The left lateral fifth plantar metatarsal this week probes deeply with a large amount of exposed bone. Quite a deterioration. He required extensive debridement. Specimens of bone for pathology and CNS obtained ooAlso considerable debridement on the right third metatarsal head 07/25/18; bone for pathology last week from the lateral left foot showed osteomyelitis. CandS of the bone Enterobacter. And Klebsiella. He is now on ciprofloxacin and the doxycycline is stopped [Dr. [name] of infectious disease] area He has an appointment with Dr. [name] tomorrow I'm going to leave the total contact cast off for this week. May wish to try to reapply that next week or the week after depending on the wound bed looks. I'm not sure if there is an operative option here, previously is followed with Dr. [name] 08/01/18 on evaluation today patient presents for reevaluation. He has been seen by infectious disease, Dr. [name], and he has placed the patient back on Ceftaz currently to be  given to him at dialysis three times a week. 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 Fortunately there does not appear to be the evidence of infection at this point in time. Overall the patient has been tolerating the dressing changes without complication. We are no longer due lives in the cast of left foot secondary to the infection obviously. 08/10/18 on evaluation today patient appears to be doing rather well all things considered in regard to his left plantar foot ulcer. He does have a significant callous on the lateral portion of his left foot he wonders if I can help clean that away to some degree today.  Fortunately he is having no evidence of infection at this time which is good news. No fevers chills noted. With that being said his right plantar foot actually does have a significant callous buildup around the wound opening this seems to not be doing as well as I would like at this point. I do believe that he would benefit from sharp debridement at this site. Subsequently I think a total contact cast would be helpful for the right foot as well. 08/17/18; the patient arrived today with a total contact cast on the right foot. This actually looks quite good. He had gone to see Dr. Megan Salon of infectious disease about the osteomyelitis on the left foot. I think he is on IV Fortaz at dialysis although I am not exactly sure of the rationale for the Fortaz at the time of this dictation. He also arrived in today with a swelling on the lateral left foot this is the site of his original wounds in fact when I first saw this man when he was under Dr. Ardeen Garland care I think this was the site of where the wound was located. It was not particularly tender however using a small scalpel I opened it to [place] some moderate amount of purulent drainage. 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 Last MRI was in May that showed progressive osteomyelitis and bone destruction of the cuboid and base of the fifth metatarsal. Last week he had an abscess over this same area this was removed. Culture was negative. ooWe are continuing with a total contact cast to the area on the third metatarsal head on the right and making some good progress here. The patient asked me again about renal transplant. He is not on the list because of the open wounds. 08/31/18; apparently the patient had an interruption in the [place] but he is now back on this at dialysis. Apparently there was a misunderstanding and Dr.  Crissie Figures orders. Due to have the MRI of the left foot tonight. ooThe area on the right foot continues to have callus thick skin and subcutaneous tissue around the wound edge that requires constant debridement however the wound is smaller we are using a total contact cast in this area. Alginate all wounds 09/07/18; without much surprise the MRI of his left foot showed osteomyelitis in the reminiscent of his fourth metatarsal but also the third metatarsal. She arrives in clinic today with the right foot and a total contact cast. There was purulent drainage coming out of the wound which I have cultured. Marked deterioration here with undermining widely around the wound orifice. Using pickups and a scalpel I remove callus and subcutaneous tissue from  a substantial new opening. ooIn a similar fashion the area on the left lateral foot that was blistered last week I have opened this and remove skin and subcutaneous tissue from this area to expose a obvious new wound. I think there is extension and communication between all of this on the left foot. ooThe patient is on [place] at dialysis. Culture done of the right foot. We are clearly not making progress here. ooWe have made him an appointment with Dr. [name] of orthopedics. I think an amputation of the left leg may be discussed. I don't think there is anything that can be done with foot salvage. 09/14/18; culture from the right foot last time showed a very resistant MRSA. This is resistant to doxycycline. I'm going to try to get linezolid at least for a week. He does not have a appointment with Dr. [name] yet. 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 I'll send a message to Dr. [name] of infectious disease. I want the patient to see Dr. [name] to go over the pros and cons of an amputation which I think will be a BKA. The patient had a question about whether this is curative or not. I told him  that I thought it would be although spread of staph aureus infection is not unheard of. MRI of the right foot was done at the end of April 2019 did not show osteomyelitis. The patient last saw Dr. [name] on 9/3. I'll send Dr. [name] a message. I would really like him to weigh the pros and cons of a BKA on the left otherwise he'll probably need IV antibiotics and perhaps hyperbaric oxygen again. He has not had a good response to this in the past. His MRI earlier this month showed progressive damage in the remnants of the fourth and third metatarsal 09/21/2018; sees Dr. [name] of orthopedics next week to discuss a left BKA in response to the osteomyelitis in the left foot. Using silver alginate to all wound areas. He is completing the Zyvox I did put in for him after last culture showed MRSA 09/29/2018; patient saw Dr. [name] of orthopedics discussed the osteomyelitis in the left foot third and fourth metatarsals. He did not recommend urgent surgery but certainly stated the only surgical option would be a BKA. He is communicating with Dr. [name]. The problem here is the instability of the areas on his foot which constantly generate draining abscesses. I will communicate with Dr. [name] about this. He has an appointment on 10/23 10/05/2018; patient sees Dr. [name] on 10/23. I have sent him a secure message not ordered any additional antibiotics for now. Patient continues to have 2 open areas on the left plantar foot at the fifth metatarsal head and the lateral aspect of the foot. These have not changed all that much. The area on the right third met head still has thick callus and surrounding subcutaneous tissue we have been using silver alginate 10/12/2018; patient sees Dr. [name] or colleague on 10/23. 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 The area on the right plantar foot is just about closed. The superior wound on the left  fifth metatarsal head is callused over but I am not sure if this is closed. The area below it is about the same. Considerable amount of callus on the lateral foot. We have been using silver alginate to the wounds 10/19/2018;  the patient saw Dr. [name] on 10/22. He wishes to try and continue to save the left foot. He has been given 6 weeks of oral ciprofloxacin. We continue to put a total contact cast on the right foot. The area over the fifth metatarsal head on the left is callused over/perhaps healed but I did not remove the callus to find out. He still has the wound on the left lateral midfoot requiring debridement. We have been using silver alginate to all wound areas 10/26/2018 ooOn the left foot our intake nurse noted some purulent drainage from the inferior wound [currently the only one that is not callused over] ooOn the right foot even though he is in total contact cast a considerable amount of thick black callus and surface eschar. On this side we have been using silver alginate under a total contact cast. oohe remains on ciprofloxacin as prescribed by Dr. [name] 11/02/2018; culture from last week which is done of the probing area on the left midfoot wound showed MRSA "few". I am going to need to contact Dr. [name] which I will do today I think he is going to need IV antibiotics again. The area on the left foot which was callused on the side is clearly separating today all of this was removed a copious amounts of callus and necrotic subcutaneous tissue. ooThe area on the right plantar foot actually remained healthy looking with a healthy granulated base. ooWe are using silver alginate to all wound areas 11/09/2018; unfortunately neither 1 of the patient's wounds areas looks at all satisfactory. On the left he has considerable necrotic debris from the plantar wound laterally over the foot. I removed copious amounts of material including callus skin and subcutaneous tissue. On the  right foot he arrives with undermining and frankly purulent drainage. Specimen obtained for culture and debridement of the callused skin and subcutaneous tissue from around the circumference. Because of this I cannot put him back in a total contact cast but to be truthful we are really unfortunately not making a lot of progress I did put in a secure text message to Dr. [name] wondering about the MRSA on the left foot. He suggested vancomycin although this is not started. I was left wondering if he expected me to send this into dialysis. Has we have more purulence on the right side wait for that result before calling dialysis 11/16/2018; I called dialysis earlier this week to get the vancomycin started. I think he got the first dose on Tuesday. The patient tells me that he fell earlier this week twisting his left foot and ankle and he is swelling. He continues to not look well. He had blood cultures done at dialysis on Tuesday he is not been informed of the results. 12/07/2018; the patient was admitted to hospital from 11/22/2018 through 12/02/2018. He underwent a left BKA. Apparently had staph sepsis. In the meantime being off the right foot this is closed over. He follows up with Dr. 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 Subsequent to this he actually had multiple falls in fact he tells me to following which calls the wound to be his apparently. He has been seeing Dr. [name] and his physician assistant in the interim. They actually did want him to come back for reevaluation to see if there's anything we can do for me wound care perspective the help this area to heal more appropriately.  The patient states that he does not have a tremendous amount of pain which is good news. No fevers, chills, nausea, or vomiting noted at this time. We have gotten approval from Dr. [name]  office had [facility] [facility] [place] to treat the patient for his stop wound. This is due to the fact that the patient was in the 90 day postop global. 1/21; the patient does not have an open wound on the right foot although he does have some pressure areas that will need to be padded when he is transferring. The dehisced surgical wound looks clean although there is some undermining of 1.5 cm superiorly. 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 This appears healthy but there is undermining. TheraSkin #1 2/11; TheraSkin #2 2/25; TheraSkin #3. Wound is measuring smaller 3/10; TheraSkin #4 wound is measuring smaller 3/24; TheraSkin #5 wound is measuring smaller and looks healthy. 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 He has no open area on the right foot although he does have some callused area over the original third metatarsal head wound. The third metatarsal is also subluxed on the right foot. I have warned him today that he cannot consider wearing a prosthesis for at least a month but he can go for measurements. He is going to need to keep the right foot padded is much as possible in his diabetic shoe indefinitely READMISSION 06/12/2019 Mr. Curling is a type II diabetic on dialysis. He has been in this clinic multiple times with wounds on his bilateral lower extremities. 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 He also has a history of wounds on his right foot but tells me that everything is going well here. He actually came in here with his prosthesis walking with a cane. We were all really quite gratified to see this He states over the last several weeks he has had a painful area at the tip of the left third finger. His dialysis shunt in his is in the left upper arm and this  particularly hurts during dialysis when his fingers get numb and there is a lot of pain in the tip of his left third finger. I believe he is seen vascular surgery. He had a Doppler done to evaluate for a dialysis steal syndrome. He is going for a banding procedure 1 week from today towards the left AV fistula. I think if that is unsuccessful they will be looking at creating a new shunt. He had a course of doxycycline when he which he finished about a week ago. He has been using Bactroban to the finger 6/25; the patient had his banding procedure and things seem to be going better. He is not having pain in his hand at dialysis. The area on the tip of his finger seems about closed. He did however have a paronychia I the last time I saw him. I have been advising him to use topical Bactroban washing the finger. Culture I did last time showed a few staph epidermidis which I was willing to dismiss as superficial skin contaminant. He arrives today with the finger wound looking better however the paronychia a seems worse 7/9; 2-week follow-up. He does not have an open wound on the tip of his third left finger. I thought he  had an initial ischemic wound on the tip of the finger as well as a paronychia a medially. All of this appears to be closed. 8/6-Patient returns after 3 weeks for follow-up and has a right second met head plantar callus with a significant area of maceration hiding an ulcer ABI repeated today on right - 1.26 8/13-Patient returns at 1 week, the right second met head plantar wound appears to be slightly better, it is surrounded by the callus area we are using silver alginate 8/20; the patient came back to clinic 2 weeks ago with a wound on the right second metatarsal head. He apparently told me he developed callus in this area but also went away from his custom shoes to a pair of running shoes. Using silver alginate. 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 There might  be much we can do to offload this although the patient tells me he has a wheelchair that he is using to try and stay off the wound is much as possible 8/27; right second metatarsal head. Still small punched out area with thick callus and subcutaneous tissue around the wound. We have been using silver collagen. This is always been an issue with this man's wound especially on this foot 9/3; right second met head. Still the same punched-out area with thick callus and subcutaneous tissue around the wound removing the circumference demonstrates repetitively undermining area. I have removed all the subcutaneous tissue associated with this. We have been using silver collagen 9/15; right second metatarsal head. Same punched-out thick callus and subcutaneous tissue around the wound area. Still requiring debridement we have been using silver collagen I changed back to silver alginate X-ray last time showed no evidence of osteomyelitis. Culture showed Klebsiella and he was started and Keflex apparently just 4 days ago 9/24; right second metatarsal head. He is completed the antibiotics I gave him for 10 days. Same punched-out thick callus and subcutaneous tissue around the area. Still requiring debridement. We have been using silver alginate. The area itself looks swollen and this could be just [place] that comes on this area with walking on a prosthesis on the left however I think he needs an MRI and I am going to order that today. There is not an option to offload this further 10/1; right second plantar metatarsal head. MRI booked for October 6. Generally looking better today using silver alginate 10/8; right second plantar metatarsal head. MRI that was done on 10/6 was negative for osteomyelitis noted prior amputations of the second and fourth toes at the [facility]. Prior amputation of the third toe to the base of the middle phalanx. Prior amputation of the fifth toe to the head of the metatarsal. 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 He does not recall about hearing about this. He did not have any pain but then again he has reduced sensation from diabetic neuropathy 10/15; right second plantar metatarsal head. Still in the same condition callus nonviable subcutaneous tissue which cleans up nicely with debridement but reforms by the next week. The patient tells me that he is offloading this is much as he can including taking his wheelchair to dialysis. We have been using silver alginate, changed to silver collagen today 10/22; right second plantar metatarsal head. Wound measures smaller but still thick callus around this wound. Still requiring debridement Objective Constitutional Patient is hypotensive. However he has this chronically and is on dialysis. Pulse  regular and within target range for patient.Marland Kitchen Respirations regular, non-labored and within target range.. Temperature is normal and within the target range for the patient.Marland Kitchen Appears in no distress. Vitals Time Taken: 3:15 PM, Height: 73 in, Weight: 202 lbs, BMI: 26.6, Temperature: 98.1 F, Pulse: 85 bpm, Respiratory Rate: 18 breaths/min, Blood Pressure: 90/51 mmHg. General Notes: Wound exam; debridement of callus and subcutaneous tissue around the wound margin this looks smaller and healthier. Clean base post debridement. Integumentary (Hair, Skin) Wound #41 status is Open. Original cause of wound was Gradually Appeared. The wound is located on the Right Metatarsal head second. The wound measures 0.3cm length x 0.3cm width x 0.4cm depth; 0.071cm^2 area and 0.028cm^3 volume. There is Fat Layer (Subcutaneous Tissue) Exposed exposed. There is no tunneling or undermining noted. There is a medium amount of serosanguineous drainage noted. The wound margin is well defined and not attached to the wound base. There is large (67-100%) pink, pale granulation within the wound bed. There is no  necrotic tissue within the wound bed. General Notes: callus periwound. 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 There was a Selective/Open Wound Skin/Dermis Debridement with a total area of 0.25 sq cm performed by Ricard Dillon., MD. With the following instrument(s): Curette to remove Non-Viable tissue/material. Material removed includes Callus and Skin: Dermis and after achieving pain control using Lidocaine 4% Topical Solution. A time out was conducted at 16:00, prior to the start of the procedure. A Minimum amount of bleeding was controlled with Pressure. The procedure was tolerated well with a pain level of 0 throughout and a pain level of 0 following the procedure. Post Debridement Measurements: 0.3cm length x 0.3cm width x 0.3cm depth; 0.021cm^3 volume. Character of Wound/Ulcer Post Debridement is improved. Severity of Tissue Post Debridement is: Fat layer exposed. Post procedure Diagnosis Wound #41: Same as Pre-Procedure Plan Follow-up Appointments: Return Appointment in 2 weeks. - Thursday Nurse Visit: - next week. Dressing Change Frequency: Wound #41 Right Metatarsal head second: Change Dressing every other day. Skin Barriers/Peri-Wound Care: Barrier cream - as needed for maceration, wetness, or redness. Wound Cleansing: May shower and wash wound with soap and water. Primary Wound Dressing: Wound #41 Right Metatarsal head second: Silver Collagen - moisten with hydrogel ([place] jelly) or saline Secondary Dressing: Wound #41 Right Metatarsal head second: Kerlix/Rolled Gauze Dry  Gauze Other: - felt to periwound and place one below wound. lightly wrap coban to hold dressing in place. felt / pad wound Edema Control: Avoid standing for long periods of time Elevate legs to the level of the heart or above for 30 minutes daily and/or when sitting, a frequency of: - throughout the day. Off-Loading: Other: - felt to periwound and place one below wound. Additional Orders / Instructions: Other: - minimize walking and standing on foot to aid in wound healing and callus buildup. 1. Silver collagen moistened with hydrogel 2. Curlex roll gauze with a light Coban wrap Electronic Signature(s) Signed: 10/18/2019 6:29:04 PM By: Linton Ham MD Entered By: Linton Ham on 10/18/2019 16:47:24 -------------------------------------------------------------------------------- SuperBill Details Patient Name: Date of Service: CHIDERA, THIVIERGE 10/18/2019 Medical Record [id number] Patient Account Number: [id number] Date of Birth/Sex:  Treating RN: 25-May-1951 (68 y.o. [name] Primary Care Provider: [name] Other Clinician: Referring Provider: Treating Provider/Extender:Jas Betten, Jeronimo Greaves, Buena Irish in Treatment: 18 Diagnosis Coding ICD-10 Codes Code Description E11.621 Type 2 diabetes mellitus with foot ulcer L97.512 Non-pressure chronic ulcer of other part of right foot with fat layer exposed I70.298 Other atherosclerosis of native arteries of extremities, other extremity Facility Procedures The patient participates with Medicare or their insurance follows the Medicare Facility Guidelines: CPT4 Code Description Modifier Quantity [id number] - DEBRIDE WOUND 1ST 20 SQ CM OR < 1 ICD-10 Diagnosis Description E11.621 Type 2 diabetes mellitus  with foot ulcer Physician Procedures CPT4 Code: [id number] Description: 53664 - WC PHYS DEBR WO ANESTH 20 SQ CM ICD-10 Diagnosis Description E11.621 Type 2 diabetes mellitus with foot ulcer Modifier: Quantity:  1 Electronic Signature(s) Signed: 10/18/2019 6:29:04 PM By: Linton Ham MD Entered By: Linton Ham on 10/18/2019 16:47:38

## 2019-10-19 DIAGNOSIS — N186 End stage renal disease: Secondary | ICD-10-CM | POA: Diagnosis not present

## 2019-10-19 DIAGNOSIS — N2581 Secondary hyperparathyroidism of renal origin: Secondary | ICD-10-CM | POA: Diagnosis not present

## 2019-10-19 DIAGNOSIS — Z992 Dependence on renal dialysis: Secondary | ICD-10-CM | POA: Diagnosis not present

## 2019-10-22 DIAGNOSIS — N2581 Secondary hyperparathyroidism of renal origin: Secondary | ICD-10-CM | POA: Diagnosis not present

## 2019-10-22 DIAGNOSIS — Z992 Dependence on renal dialysis: Secondary | ICD-10-CM | POA: Diagnosis not present

## 2019-10-22 DIAGNOSIS — N186 End stage renal disease: Secondary | ICD-10-CM | POA: Diagnosis not present

## 2019-10-24 DIAGNOSIS — Z992 Dependence on renal dialysis: Secondary | ICD-10-CM | POA: Diagnosis not present

## 2019-10-24 DIAGNOSIS — N186 End stage renal disease: Secondary | ICD-10-CM | POA: Diagnosis not present

## 2019-10-24 DIAGNOSIS — N2581 Secondary hyperparathyroidism of renal origin: Secondary | ICD-10-CM | POA: Diagnosis not present

## 2019-10-25 ENCOUNTER — Other Ambulatory Visit: Payer: Self-pay

## 2019-10-25 ENCOUNTER — Encounter (HOSPITAL_BASED_OUTPATIENT_CLINIC_OR_DEPARTMENT_OTHER): Payer: Medicare Other | Admitting: Internal Medicine

## 2019-10-25 DIAGNOSIS — E11621 Type 2 diabetes mellitus with foot ulcer: Secondary | ICD-10-CM | POA: Diagnosis not present

## 2019-10-25 DIAGNOSIS — I70208 Unspecified atherosclerosis of native arteries of extremities, other extremity: Secondary | ICD-10-CM | POA: Diagnosis not present

## 2019-10-25 DIAGNOSIS — G250 Essential tremor: Secondary | ICD-10-CM | POA: Diagnosis not present

## 2019-10-25 DIAGNOSIS — E1151 Type 2 diabetes mellitus with diabetic peripheral angiopathy without gangrene: Secondary | ICD-10-CM | POA: Diagnosis not present

## 2019-10-25 DIAGNOSIS — I132 Hypertensive heart and chronic kidney disease with heart failure and with stage 5 chronic kidney disease, or end stage renal disease: Secondary | ICD-10-CM | POA: Diagnosis not present

## 2019-10-25 DIAGNOSIS — E119 Type 2 diabetes mellitus without complications: Secondary | ICD-10-CM | POA: Diagnosis not present

## 2019-10-25 DIAGNOSIS — L97812 Non-pressure chronic ulcer of other part of right lower leg with fat layer exposed: Secondary | ICD-10-CM | POA: Diagnosis not present

## 2019-10-25 DIAGNOSIS — E1122 Type 2 diabetes mellitus with diabetic chronic kidney disease: Secondary | ICD-10-CM | POA: Diagnosis not present

## 2019-10-25 DIAGNOSIS — N529 Male erectile dysfunction, unspecified: Secondary | ICD-10-CM | POA: Diagnosis not present

## 2019-10-25 DIAGNOSIS — I10 Essential (primary) hypertension: Secondary | ICD-10-CM | POA: Diagnosis not present

## 2019-10-25 NOTE — Progress Notes (Signed)
CLEBURN, GREIWE ([id number]) Visit Report for 10/25/2019 Arrival Information Details Patient Name: Date of Service: LONDON, FRILOUX 10/25/2019 12:45 PM Medical Record [id number] Patient Account Number: [id number] Date of Birth/Sex: 01-Sep-1951 (68 y.o. M) Treating RN: Deon Pilling Primary Care Levern Kalka: [name] Other Clinician: Referring Amazing Cowman: Treating Thanh Pomerleau/Extender:Robson, Birdena Crandall in Treatment: 64 Visit Information History Since Last Visit Cane Added or deleted any medications: No Patient Arrived: 12:57 Any new allergies or adverse reactions: No Arrival Time: Had a fall or experienced change in No Accompanied By: self None activities of daily living that may affect Transfer Assistance: risk of falls: Patient Identification Verified: Yes Signs or symptoms of abuse/neglect since last No Secondary Verification Process Completed: Yes visito Patient Requires Transmission-Based No Hospitalized since last visit: No Precautions: Implantable device outside of the clinic excluding No Patient Has Alerts: No cellular tissue based products placed in the center since last visit: Has Dressing in Place as Prescribed: Yes Pain Present Now: No Electronic Signature(s) Signed: 10/25/2019 3:04:52 PM By: Deon Pilling Entered By: Deon Pilling on 10/25/2019 12:58:45 -------------------------------------------------------------------------------- Clinic Level of Care Assessment Details Patient Name: Date of Service: NICOLAS, ZIBELL 10/25/2019 12:45 PM Medical Record [id number] Patient Account Number: [id number] Date of Birth/Sex: 1951-02-20 (68 y.o. M) Treating RN: Deon Pilling Primary Care Dimetri Armitage: [name] Other Clinician: Referring Mitchel Delduca: Treating Tazia Illescas/Extender:Robson, Jeronimo Greaves, Buena Irish in Treatment: 26 Clinic Level of Care Assessment Items TOOL 4 Quantity Score X - Use when only an EandM is performed on  FOLLOW-UP visit 1 0 ASSESSMENTS - Nursing Assessment / Reassessment X - Reassessment of Co-morbidities (includes updates in patient status) 1 10 X - Reassessment of Adherence to Treatment Plan 1 5 ASSESSMENTS - Wound and Skin Assessment / Reassessment X - Simple Wound Assessment / Reassessment - one wound 1 5 []  - Complex Wound Assessment / Reassessment - multiple wounds 0 X - Dermatologic / Skin Assessment (not related to wound area) 1 10 ASSESSMENTS - Focused Assessment []  - Circumferential Edema Measurements - multi extremities 0 X - Nutritional Assessment / Counseling / Intervention 1 10 []  - Lower Extremity Assessment (monofilament, tuning fork, pulses) 0 []  - Peripheral Arterial Disease Assessment (using hand held doppler) 0 ASSESSMENTS - Ostomy and/or Continence Assessment and Care []  - Incontinence Assessment and Management 0 []  - Ostomy Care Assessment and Management (repouching, etc.) 0 PROCESS - Coordination of Care X - Simple Patient / Family Education for ongoing care 1 15 []  - Complex (extensive) Patient / Family Education for ongoing care 0 X - Staff obtains Programmer, systems, Records, Test Results / Process Orders 1 10 []  - Staff telephones HHA, Nursing Homes / Clarify orders / etc 0 []  - Routine Transfer to another Facility (non-emergent condition) 0 []  - Routine Hospital Admission (non-emergent condition) 0 []  - New Admissions / Biomedical engineer / Ordering NPWT, Apligraf, etc. 0 []  - Emergency Hospital Admission (emergent condition) 0 X - Simple Discharge Coordination 1 10 []  - Complex (extensive) Discharge Coordination 0 PROCESS - Special Needs []  - Pediatric / Minor Patient Management 0 []  - Isolation Patient Management 0 []  - Hearing / Language / Visual special needs 0 []  - Assessment of Community assistance (transportation, D/C planning, etc.) 0 []  - Additional assistance / Altered mentation 0 []  - Support Surface(s) Assessment (bed, cushion, seat, etc.)  0 INTERVENTIONS - Wound Cleansing / Measurement X - Simple Wound Cleansing - one wound 1 5 []  - Complex Wound Cleansing - multiple wounds 0 X - Wound Imaging (  photographs - any number of wounds) 1 5 []  - Wound Tracing (instead of photographs) 0 X - Simple Wound Measurement - one wound 1 5 []  - Complex Wound Measurement - multiple wounds 0 INTERVENTIONS - Wound Dressings X - Small Wound Dressing one or multiple wounds 1 10 []  - Medium Wound Dressing one or multiple wounds 0 []  - Large Wound Dressing one or multiple wounds 0 []  - Application of Medications - topical 0 []  - Application of Medications - injection 0 INTERVENTIONS - Miscellaneous []  - External ear exam 0 []  - Specimen Collection (cultures, biopsies, blood, body fluids, etc.) 0 []  - Specimen(s) / Culture(s) sent or taken to Lab for analysis 0 []  - Patient Transfer (multiple staff / Civil Service fast streamer / Similar devices) 0 []  - Simple Staple / Suture removal (25 or less) 0 []  - Complex Staple / Suture removal (26 or more) 0 []  - Hypo / Hyperglycemic Management (close monitor of Blood Glucose) 0 []  - Ankle / Brachial Index (ABI) - do not check if billed separately 0 X - Vital Signs 1 5 Has the patient been seen at the hospital within the last three years: Yes Total Score: 105 Level Of Care: New/Established - Level 3 Electronic Signature(s) Signed: 10/25/2019 3:04:52 PM By: Deon Pilling Entered By: Deon Pilling on 10/25/2019 13:07:30 -------------------------------------------------------------------------------- Encounter Discharge Information Details Patient Name: Date of Service: ZAKARYA, FRISCHMAN 10/25/2019 12:45 PM Medical Record [id number] Patient Account Number: [id number] Date of Birth/Sex: [birth year] (68 y.o. M) Treating RN: Deon Pilling Primary Care Mavrick Mcquigg: [name] Other Clinician: Referring Zula Hovsepian: Treating Drew Lips/Extender:Robson, Birdena Crandall in Treatment: 19 Encounter Discharge  Information Items Discharge Condition: Stable Ambulatory Status: Cane Discharge Destination: Home Transportation: Private Auto Accompanied By: self Schedule Follow-up Appointment: Yes Clinical Summary of Care: Electronic Signature(s) Signed: 10/25/2019 3:04:52 PM By: Deon Pilling Entered By: Deon Pilling on 10/25/2019 13:06:55 -------------------------------------------------------------------------------- Patient/Caregiver Education Details Patient Name: [name] 10/29/2020andnbsp12:45 Date of Service: PM Medical Record [id number] Number: Patient Account Number: [id number] Treating RN: Date of Birth/Gender: [birth year] (68 y.o. [name]) Other Clinician: Primary Care Treating Verna Czech Physician: Physician/Extender: Referring Physician: Sherlynn Stalls in Treatment: 75 Education Assessment Education Provided To: Patient Education Topics Provided Elevated Blood Sugar/ Impact on Healing: Handouts: Elevated Blood Sugars: How Do They Affect Wound Healing Methods: Explain/Verbal Responses: Reinforcements needed Electronic Signature(s) Signed: 10/25/2019 3:04:52 PM By: Deon Pilling Entered By: Deon Pilling on 10/25/2019 13:06:43 -------------------------------------------------------------------------------- Wound Assessment Details Patient Name: Date of Service: BROM, [place] 10/25/2019 12:45 PM Medical Record [id number] Patient Account Number: [id number] Date of Birth/Sex: Jan 03, 1951 (68 y.o. M) Treating RN: Deon Pilling Primary Care Aniceto Kyser: [name] Other Clinician: Referring Elisavet Buehrer: Treating Glenisha Gundry/Extender:Robson, Jeronimo Greaves, Buena Irish in Treatment: 19 Wound Status Wound Number: [id number] Primary Diabetic Wound/Ulcer of the Lower Etiology: Extremity Wound Location: Right Metatarsal head second Wound Status: Open Wounding Event: Gradually Appeared Date Acquired: 07/23/2019 Weeks Of Treatment:  12 Clustered Wound: No Wound Measurements Length: (cm) 0.3 Width: (cm) 0.3 Depth: (cm) 0.4 Area: (cm) 0.071 Volume: (cm) 0.028 Wound Description Classification: Grade 1 % Reduction in Area: 96% % Reduction in Volume: 94.7% Treatment Notes Wound #41 (Right Metatarsal head second) 1. Cleanse With Wound Cleanser 3. Primary Dressing Applied Collegen AG Hydrogel or K-Y Jelly 4. Secondary Dressing Dry Gauze Roll Gauze Other secondary dressing (specify in notes) 5. Secured With Tape 7. Footwear/Offloading device applied Felt/Foam Notes coban to secure in place. Electronic Signature(s) Signed: 10/25/2019 3:04:52 PM By: Rolin Barry,  Bobbi Entered By: Deon Pilling on 10/25/2019 12:59:04 -------------------------------------------------------------------------------- Vitals Details Patient Name: Date of Service: EDGER, FENNEWALD 10/25/2019 12:45 PM Medical Record [id number] Patient Account Number: [id number] Date of Birth/Sex: 1951/07/14 (68 y.o. M) Treating RN: Deon Pilling Primary Care Zakkary Thibault: [name] Other Clinician: Referring Bader Stubblefield: Treating Masiah Lewing/Extender:Robson, Jeronimo Greaves, Buena Irish in Treatment: 19 Vital Signs Time Taken: 12:58 Temperature (F): 98.4 Height (in): 73 Pulse (bpm): 83 Weight (lbs): 202 Respiratory Rate (breaths/min): 16 Body Mass Index (BMI): 26.6 Blood Pressure (mmHg): 98/57 Reference Range: 80 - 120 mg / dl Electronic Signature(s) Signed: 10/25/2019 3:04:52 PM By: Deon Pilling Entered By: Deon Pilling on 10/25/2019 12:59:15

## 2019-10-26 DIAGNOSIS — N2581 Secondary hyperparathyroidism of renal origin: Secondary | ICD-10-CM | POA: Diagnosis not present

## 2019-10-26 DIAGNOSIS — N186 End stage renal disease: Secondary | ICD-10-CM | POA: Diagnosis not present

## 2019-10-26 DIAGNOSIS — Z992 Dependence on renal dialysis: Secondary | ICD-10-CM | POA: Diagnosis not present

## 2019-10-28 DIAGNOSIS — N186 End stage renal disease: Secondary | ICD-10-CM | POA: Diagnosis not present

## 2019-10-28 DIAGNOSIS — E1122 Type 2 diabetes mellitus with diabetic chronic kidney disease: Secondary | ICD-10-CM | POA: Diagnosis not present

## 2019-10-28 DIAGNOSIS — Z992 Dependence on renal dialysis: Secondary | ICD-10-CM | POA: Diagnosis not present

## 2019-10-29 DIAGNOSIS — N2581 Secondary hyperparathyroidism of renal origin: Secondary | ICD-10-CM | POA: Diagnosis not present

## 2019-10-29 DIAGNOSIS — Z992 Dependence on renal dialysis: Secondary | ICD-10-CM | POA: Diagnosis not present

## 2019-10-29 DIAGNOSIS — N186 End stage renal disease: Secondary | ICD-10-CM | POA: Diagnosis not present

## 2019-10-29 DIAGNOSIS — E1121 Type 2 diabetes mellitus with diabetic nephropathy: Secondary | ICD-10-CM | POA: Diagnosis not present

## 2019-10-29 NOTE — Progress Notes (Signed)
ANEIL, ANGELINI ([id number]) Visit Report for 10/25/2019 SuperBill Details Patient Name: Date of Service: [name], [name] 10/25/2019 Medical Record [id number] Patient Account Number: [id number] Date of Birth/Sex: Treating RN: 1951/07/15 (68 y.o. 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

## 2019-10-31 DIAGNOSIS — N186 End stage renal disease: Secondary | ICD-10-CM | POA: Diagnosis not present

## 2019-10-31 DIAGNOSIS — Z992 Dependence on renal dialysis: Secondary | ICD-10-CM | POA: Diagnosis not present

## 2019-10-31 DIAGNOSIS — N2581 Secondary hyperparathyroidism of renal origin: Secondary | ICD-10-CM | POA: Diagnosis not present

## 2019-10-31 DIAGNOSIS — E1121 Type 2 diabetes mellitus with diabetic nephropathy: Secondary | ICD-10-CM | POA: Diagnosis not present

## 2019-11-01 ENCOUNTER — Encounter (HOSPITAL_BASED_OUTPATIENT_CLINIC_OR_DEPARTMENT_OTHER): Payer: Medicare Other | Attending: Internal Medicine | Admitting: Internal Medicine

## 2019-11-01 ENCOUNTER — Other Ambulatory Visit: Payer: Self-pay

## 2019-11-01 DIAGNOSIS — E1151 Type 2 diabetes mellitus with diabetic peripheral angiopathy without gangrene: Secondary | ICD-10-CM | POA: Insufficient documentation

## 2019-11-01 DIAGNOSIS — E1122 Type 2 diabetes mellitus with diabetic chronic kidney disease: Secondary | ICD-10-CM | POA: Insufficient documentation

## 2019-11-01 DIAGNOSIS — M19072 Primary osteoarthritis, left ankle and foot: Secondary | ICD-10-CM | POA: Diagnosis not present

## 2019-11-01 DIAGNOSIS — Z89421 Acquired absence of other right toe(s): Secondary | ICD-10-CM | POA: Diagnosis not present

## 2019-11-01 DIAGNOSIS — L97512 Non-pressure chronic ulcer of other part of right foot with fat layer exposed: Secondary | ICD-10-CM | POA: Diagnosis not present

## 2019-11-01 DIAGNOSIS — Z992 Dependence on renal dialysis: Secondary | ICD-10-CM | POA: Insufficient documentation

## 2019-11-01 DIAGNOSIS — E11621 Type 2 diabetes mellitus with foot ulcer: Secondary | ICD-10-CM | POA: Diagnosis not present

## 2019-11-01 DIAGNOSIS — Z89512 Acquired absence of left leg below knee: Secondary | ICD-10-CM | POA: Insufficient documentation

## 2019-11-01 DIAGNOSIS — M86679 Other chronic osteomyelitis, unspecified ankle and foot: Secondary | ICD-10-CM | POA: Diagnosis not present

## 2019-11-01 DIAGNOSIS — I70298 Other atherosclerosis of native arteries of extremities, other extremity: Secondary | ICD-10-CM | POA: Diagnosis not present

## 2019-11-01 DIAGNOSIS — L03115 Cellulitis of right lower limb: Secondary | ICD-10-CM | POA: Insufficient documentation

## 2019-11-01 DIAGNOSIS — N189 Chronic kidney disease, unspecified: Secondary | ICD-10-CM | POA: Diagnosis not present

## 2019-11-01 NOTE — Progress Notes (Signed)
BEVIN, MAYALL ([id number]) Visit Report for 11/01/2019 Debridement Details Patient Name: Date of Service: CODEE, TUTSON 11/01/2019 12:30 PM Medical Record [id number] Patient Account Number: [id number] Date of Birth/Sex: 1951-01-29 (68 y.o. M) Treating RN: Deon Pilling Primary Care Provider: [name] Other Clinician: Referring Provider: Treating Provider/Extender:Kolleen Ochsner, Jeronimo Greaves, Buena Irish in Treatment: 20 Debridement Performed for Wound #41 Right Metatarsal head second Assessment: Performed By: Physician Ricard Dillon., MD Debridement Type: Debridement Severity of Tissue Pre Fat layer exposed Debridement: Level of Consciousness (Pre- Awake and Alert procedure): Pre-procedure Verification/Time Out Taken: Yes - 13:44 Start Time: 13:44 Pain Control: Other : benzocaine, 20% Total Area Debrided (L x W): 0.5 (cm) x 0.5 (cm) = 0.25 (cm) Tissue and other material Viable, Non-Viable, Callus, Subcutaneous debrided: Level: Skin/Subcutaneous Tissue Debridement Description: Excisional Instrument: Curette Bleeding: Minimum Hemostasis Achieved: Pressure End Time: 13:45 Procedural Pain: 0 Post Procedural Pain: 0 Response to Treatment: Procedure was tolerated well Level of Consciousness Awake and Alert (Post-procedure): Post Debridement Measurements of Total Wound Length: (cm) 0.2 Width: (cm) 0.4 Depth: (cm) 0.6 Volume: (cm) 0.038 Character of Wound/Ulcer Post Improved Debridement: Severity of Tissue Post Debridement: Fat layer exposed Post Procedure Diagnosis Same as Pre-procedure Electronic Signature(s) Signed: 11/01/2019 5:30:28 PM By: Deon Pilling Signed: 11/01/2019 5:45:12 PM By: Linton Ham MD Entered By: Linton Ham on 11/01/2019 13:59:29 -------------------------------------------------------------------------------- HPI Details Patient Name: Date of Service: IMAN, OROURKE 11/01/2019 12:30 PM Medical Record [id number]  Patient Account Number: [id number] Date of Birth/Sex: Treating RN: 01/16/1951 (68 y.o. [name] Primary Care Provider: [name] Other Clinician: Referring Provider: Treating Provider/Extender:Cleburn Maiolo, Jeronimo Greaves, Buena Irish in Treatment: 20 History of Present Illness Location: Patient presents with a wound to bilateral feet. Quality: Patient reports experiencing essentially no pain. Severity: Mildly severe wound with no evidence of infection Duration: Patient has had the wound for greater than 2 weeks prior to presenting for treatment HPI Description: The patient is a pleasant 68 yrs old bm here for evaluation of ulcers on the plantar aspect of both feet. 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 He has a left arm graft for access. He has been trying to stay off his feet for weeks but does not seem to have any improvement in the wound. He has been seeing someone at the foot center and was referred to the Wound Care center for further evaluation. 12/30/15 the patient has 3 wounds one over the right first metatarsal head and 2 on the left foot at the left fifth and lleft first metatarsal head. All of these look relatively similar. The one over the left fifth probe to bone I could not prove that any of the others did. I note his MRI in November that did not show osteomyelitis. His peripheral pulses seem robust. All of these underwent surgical debridement to remove callus nonviable skin and subcutaneous tissue 01/06/16; the patient had his sutures removed from the right fifth ray amputation. There may be a small open part of this superiorly but otherwise the incision looks good. Areas over his right first, left first and left fifth metatarsal head all underwent surgical debridement as a varying degree of callus, skin and nonviable subcutaneous tissue. The area that is most worrisome is the right fifth metatarsal head which has  a wound probes precariously close to bone. There is no purulent drainage or erythema 01/13/16; I'm not exactly sure of the status of the right fifth ray amputation site however  he follows with Dr. [name] later this week. The area over his right first plantar metatarsal head, left first and fifth plantar metatarsal head are all in the same status. Thick circumferential callus, nonviable subcutaneous tissue. Culture of the left fifth did not culture last week 01/20/16; all of the patient's wounds appear and roughly the same state although his amputation site on the right lateral foot looks better. His wounds over the right first, left first and left fifth metatarsal heads all underwent difficult surgical debridement removing circumferential callus nonviable skin and subcutaneous tissue. There is no overt evidence of infection in these areas. MRI at the end of December of the left foot did not show osteomyelitis, right foot showed osteomyelitis of the right fifth digit he is now status post amputation. 01/27/16 the patient's wounds over his plantar first and fifth metatarsal heads on the left all appear better having been started on a total contact cast last week. They were debridement of circumferential callus and nonviable subcutaneous tissue as was the wound over the first metatarsal head. His surgical incision on the right also had some light surface debridement done. 03/23/2016 -- the patient was doing really well and most of his wounds had almost completely healed but now he came back today with a history of having a discharge from the area of his right foot on the plantar aspect and also between his first and second toe. He also has had some discharge from the left foot. Addendum: I spoke to the PA [name] at the dialysis center whose fax number is [phone]. We discussed the infection the patient has and she will put the patient on vancomycin and Fortaz until the final  culture report is back. We will fax this as soon as available. 03/30/2016 -- left foot x-ray IMPRESSION:No definitive osteomyelitis noted. X-ray of the right foot -- IMPRESSION: 1. Soft tissue swelling. Prior amputation right fifth digit. No acute or focal bony abnormality identified. If osteomyelitis remains a clinical concern, MRI can be obtained. 2. Peripheral vascular disease. His culture reports have grown an MSSA -- and we will fax this report to his hemodialysis center. He was called in a prescription of oral doxycycline but I have told her not to fill this in as he is already on IV antibiotics. 04/06/2016 -- a few days ago, I spoke to the hemodialysis center nurse who had stopped the IV antibiotics and he was given a prescription for doxycycline 100 mg by mouth twice a day for a week and he is on this at the present time. 05/11/2016 -- he has recently seen his PCP this week and his hemoglobin A1c was 7. He is working on his paperwork to get his orthotic shoes. 05/25/2016 -- -- x-ray of the left foot IMPRESSION:No acute bony abnormality. No radiographic changes of acute osteomyelitis. No change since prior study. X-ray of the right foot -- IMPRESSION: Postsurgical changes are seen involving the fifth toe. No evidence of acute osteomyelitis. he developed a large blister on the medial part of his right foot and this opened out and drain fluid. 06/01/2016 -- he has his MRI to be done this afternoon. 06/15/2016 -- MRI of the left forefoot without contrast shows 2 separate regions of cutaneous and subcutaneous edema and possible ulceration and blistering along the ball of the foot. No obvious osteomyelitis identified. MRI of the right foot showed cutaneous and subcutaneous thickening plantar to the first digit sesamoid with an ulcer crater but no underlying osteomyelitis is identified.  06/22/2016 -- the right foot plantar ulcer has been draining a lot of seropurulent material for the last  few days. 06/30/2016 -- spoke to the dialysis center and I believe I spoke to [place] a PA at the center who discussed with me and agreed to putting Legrand Como on vancomycin until his cultures arrive. On review of his culture report no WBCs were seen or no organisms were seen and the culture was reincubated for better growth. The final report is back and there were no predominant growth including Streptococcus or Staphylococcus. Clinically though he has a lot of drainage from both wounds a lot of undermining and there is further blebs on the left foot towards the interspace between his first and second toe. 08/10/2016 -- the culture from the right foot showed normal skin flora and there was no Staphylococcus aureus was group A streptococcus isolated. 09/21/2016 -- -- MRI of the right foot was done on 09/13/2016 - IMPRESSION: Findings most consistent with acute osteomyelitis throughout the great toe, sesamoid bones and plantar aspect of the head of the first metatarsal. Fluid in the sheath of the flexor tendon of the great toe could be sympathetic but is worrisome for septic tenosynovitis. First MTP joint effusion worrisome for septic joint. He was admitted to the hospital on 09/11/2016 and treated for a fever with vancomycin and cefepime. He was seen by Dr. Bobby Rumpf of infectious disease who recommended 6 weeks treatment with vancomycin and ceftazidime with his hemodialysis and to continue to see as in the wound clinic. Vascular consult was pending. The patient was discharged home on 09/14/2016 and he would continue with IV antibiotics for 6 weeks. 09/28/16 wound appears reasonably healthy. Continuing with total contact cast 10/05/16 wound is smaller and looking healthy. Continue with total contact cast. He continues on IV antibiotics 10/12/2016 -- he has developed a new wound on the dorsal aspect of his left big toe and this is a superficial injury with no surrounding cellulitis. He has  completed 30 days of IV antibiotics and is now ready to start his hyperbaric oxygen therapy as per his insurance company's recommendation. 10/19/2016 -- after the cast was removed on the right side he has got good resolution of his ulceration on the right plantar foot. he has a new wound on the left plantar foot in the region of his fourth metatarsal and this will need sharp debridement. 10/26/2016 -- Xray of the right foot complete: IMPRESSION: Changes consistent with osteomyelitis involving the head of the first metatarsal and base of the first proximal phalanx. The sesamoid bones are also likely involved given their positioning. 11/02/2016 -- there still awaiting insurance clearance for his hyperbaric oxygen therapy and hopefully he will begin treatment soon. 11/09/2016 -- started with hyperbaric oxygen therapy and had some barotrauma to the right ear and this was seen by ENT who was prescribed Afrin drops and would probably continue with HBO and he is scheduled for myringotomy tubes on Friday 11/16/2016 -- he had his myringotomy tubes placed on Friday and has been doing much better after that with some fluid draining out after hyperbaric treatment today. The pain was much better. 11/30/2016 -- over the last 2 days he noticed a swelling and change of color of his right second toe and this had been draining minimal fluid. 12/07/2016 -- x-ray of the right foot -- IMPRESSION:1. Progressive ulceration at the distal aspect of the second digit with significant soft tissue swelling and osseous changes in the distal phalanx compatible with osteomyelitis.  2. Chronic osteomyelitis at the first MTP joint. 3. Ulcerations at the second and third toes as well without definite osseous changes. Osteomyelitis is not excluded. 4. Fifth digit amputation. On 12/01/2016 I spoke to Dr. Bobby Rumpf, the infectious disease specialist, who kindly agreed to treat this with IV antibiotics and he would call in the  order to the dialysis center and this has been discussed in detail with the patient who will make the appropriate arrangements. The patient will also book an appointment as soon as possible to see Dr. [name] in the office. Of note the patient has not been on antibiotics this entire week as the dialysis center did not receive any orders from Dr. [name]. I got in touch with Dr. [name] who tells me the patient has an appointment to see him this coming Wednesday. 12/14/2016 -- the patient is on ceftazidime and vancomycin during his dialysis, and I understand this was put on by his nephrologist Dr. Raliegh Ip possibly after speaking with infectious disease Dr. [name] 12/23/16; patient was on my schedule today for a wound evaluation as he had difficulties with his schedule earlier this week. I note that he is on vancomycin and ceftazidine at dialysis. In spite of this he arrives today with a new wound on the base of the right second toe this easily probes to bone. The known wound at the tip of the second toe With this area. He also has a superficial area on the medial aspect of the third toe on the side of the DIP. This does not appear to have much depth. The area on the plantar left foot is a deep area but did not probe the bone 12/28/16 -- he has brought in some lab work and the most recent labs done showed a hemoglobin of 12.1 hematocrit of 36.3, neutrophils of 51% WBC count of 5.6, BN of 53, albumin of 4.1 globulin of 3.7, vancomycin 13 g per mL. 01/04/2017 -- he saw Dr. [name] who has recommended a amputation of the right second toe and he is awaiting this date. He sees Dr. Bobby Rumpf of infectious disease tomorrow. The bone culture taken on 12/28/2016 had no growth in 2 days. 01/11/2017 -- was seen by Dr. Bobby Rumpf regarding the management and has recommended a eval by vascular surgeons. He recommended to continue the antibiotics during his hemodialysis. Xray of the left foot -- IMPRESSION:  No acute fracture, dislocation, or osseous erosion identified. 01/18/2017 -- he has his vascular workup later today and he is going to have his right foot second toe amputation this coming Friday by Dr. [name]. We have also put in for a another 30 treatments with hyperbaric oxygen therapy 01/25/2017 -- I have reviewed Dr. Donnetta Hutching is vascular report from last week where he reviewed him and thought his vascular function was good enough to heal his amputation site and no further tests were recommended. He also had his orthopedic related to surgery which is still pending the notes and we will review these next week. 02/01/2017 -- the operative remote of Dr. 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 02/08/2017 --he was seen by Dr. [name] on 02/03/2017 -- patient is on ceftaz and  Vanco. after a thorough review he had recommended to continue with antibiotics during hemodialysis. The patient was seen by Dr. [name], but I do not find any follow-up note on the electronic medical record. he has removed the dressing over the right foot to remove the sutures and asked him to see him back in a month's time 02/22/2017 -- patient has been febrile and has been having symptoms of the upper respiratory tract infection but has not been checked for the flu and it's been over 4 days now. He says he is feeling better today. Some drainage between his left first and second toe and this needed to be looked at 03/08/2017 -- was seen by Dr. Bobby Rumpf on 03/07/2017 after review he will stop his antibiotics and see him back in a month to see if he is feeling  well. 03/15/2017 -- he has completed his course of hyperbaric oxygen therapy and is doing fine with his health otherwise. 03/22/2017 -- his nutritionist at the dialysis center has recommended a protein supplement to help build his collagen and we will prescribe this for him when he has the details 05/03/2017 -- he recently noticed the area on the plantar aspect of his fifth metatarsal head which opened out and had minimal drainage. 05/10/2017 -- -- x-ray of the left foot -- IMPRESSION:1. No convincing conventional radiographic evidence of active osteomyelitis.2. Active soft tissue ulceration at the tip of the fifth digit, and at the lateral aspect of the foot adjacent the base of the fifth metatarsal. 3. Surgical changes of prior fourth toe amputation. 4. 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 5. Small vessel atherosclerotic vascular calcifications. 6. 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 ======= Readmission after 5 weeks: 06/15/2017 -- the patient returns after 5 weeks having had an MRI done on 05/17/2017 MRI of the left foot without contrast showed soft tissue ulcer overlying the base of the fifth metatarsal. 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 No drainable fluid collection to suggest an abscess. He was in hospital between 05/16/2017 and 05/25/2017 -- and on discharge was asked to follow-up with Dr. [name] and Dr. [name]. He was treated 2 weeks post discharge with vancomycin plus ceftriaxone with hemodialysis and oral metronidazole 500 mg 3 times a day. 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 last hemoglobin A1c was 6.7% He had a postoperative MR of the foot on 05/23/2017 -- which showed no new areas of cortical bone loss, edema or soft tissue ulceration to suggest  osteomyelitis. the patient has completed his course of IV antibiotics during dialysis and is to see the infectious disease doctor tomorrow. Dr. [name] had seen him after suture removal and asked him to keep the wound with a dry dressing 06/21/2017 -- he was seen by Dr. Bobby Rumpf of infectious disease on 06/16/2017 -- he stopped his Flagyl and will see him back in 6 weeks. He was asked to follow-up with Dr. [name] and with the wound clinic clinic. 07/05/17; bone biopsy from last time showed acute osteomyelitis. This is from the reminiscent in the left fifth metatarsal. Culture result is apparently still pending [holding for anaerobe}. From my understanding in this case this is been a progressive necrotic wound which is deteriorated markedly over the last 3 weeks since he returned here. He now has a large area of  exposed bone which was biopsied and cultured last week. Dr. Graylon Good has put him on vancomycin and Fortaz during his hemodialysis and Flagyl orally. He is to see Dr. [name] next week 07/19/2017 -- the patient was reviewed by Dr. [name] of orthopedics who reviewed the case in detail and agreed with the plan to continue with IV antibiotics, aggressive wound care and hyperbaric oxygen therapy. He would see him back in 3 weeks' time 08/09/2017 -- saw Dr. Bobby Rumpf on 08/03/2017 -he was restarted on his antibiotics for 6 weeks which included vanco, ceftaz and Flagyl. he recommended continuing his antibiotics for 6 weeks and reevaluate his completion date. He would continue with wound care and hyperbaric oxygen therapy. 08/16/2017 -- I understand he will be completing his 6 weeks of antibiotics sometime later this week. 09/19/2017 -- he has been without his wound VAC for the last week due to lack of supplies. He has been packing his wound with silver alginate. 10/04/2017 -- he has an appointment with Dr. [name] tomorrow and hence we will not apply the wound VAC after his dressing  changes today. 10/11/2017 -- he was seen by Dr. Bobby Rumpf on 10/05/2017, noted that the patient was currently off antibiotics, and after thorough review he recommended he follow-up with the wound care as per plan and no further antibiotics at this point. 11/29/17; patient is arrived for the wound on his left lateral foot.Marland Kitchen He is completed IV antibiotics and 2 rounds of hyperbaric oxygen for treatment of underlying osteomyelitis. He arrives today with a surface on most of the wound area however our intake nurse noted drainage from the superior aspect. This was brought to my attention. 12/13/2017 -- the right plantar foot had a large bleb and once the bleb was opened out a large callus and subcutaneous debris was removed and he has a plantar ulcer near the fourth metatarsal head 12/28/17 on evaluation today patient appears to be doing acutely worse in regard to his left foot. The wound which has been appearing to do better as now open up more deeply there is bone palpable at the base of the wound unfortunately. He tells me that this feels "like it did when he had osteomyelitis previously" he also noted that his second toe on the left foot appears to be doing worse and is swollen there does appear to be some fluid collected underneath. His right foot plantar ulcer appears to be doing somewhat better at this point and there really is no complication at the site currently. No fevers, chills, nausea, or vomiting noted at this time. Patient states that he normally has no pain at this site however. Today he is having significant pain. 01/03/18; culture done last week showed methicillin sensitive staph aureus and group B strep. He was on Septra however he arrives today with a fever of 101. He has a functional dialysis shunt in his left arm but has had no pain here. No cough. He still makes some urine no dysuria he did have abdominal pain nausea and diarrhea over the weekend but is not had any diarrhea  since yesterday. Otherwise he has no specific complaints 01/05/18; the patient returns today in follow-up for his presentation from 1/8. At that point he was febrile. I gave him some Levaquin adjusted for his dialysis status. He tells me the fever broke that night and it is not likely that this was actually a wound infection. He is gone on to have an MRI of the left foot; this showed  interval development of an abnormal signal from the base of the third and fourth metatarsal and in the cuboid reminiscent consistent with osteomyelitis. There is no mention of the left second toe I think which was a concern when it was ordered. The patient is taking his Levaquin 01/10/18; the patient has completed his antibiotics today. The area on the left lateral foot is smaller but it still probes easily to bone. He has underlying osteomyelitis here and sees Dr. Megan Salon of infectious disease this week The area on the right third plantar metatarsal head still requires debridement not much change in dimensions He has a new wound on the medial tip of his right third toe again this probes to bone. He only noticed this 2 days ago 01/17/18; the patient saw Dr. [name] last week and he is back on Vanco and Fortaz at dialysis. 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 We are previously seeing him for a wound also on the base of the fourth med head on the right. X- ray of this area did not show osteomyelitis in relation to a new wound on the tip of his right third toe. Again this probes to bone. Finally his second toe on the left which didn't really show anything on the MRI at least no report appears to have a separated cutaneous area which I think is going to come right off the tip of his toe and leave exposed bone. Whether this is infectious or ischemic I am not clear Culture I did from the third toe last week  which was his new wound was negative. Plain x-ray on the right foot did not show osteomyelitis in the toes 01/24/18; the patient is going went to see Dr. [name]. He is going to have a amputation of the left second and right third toe although paradoxically the left second toe looks better than last week. We have treating a deep probing wound on the left lateral foot and the area over the third metatarsal head on the right foot. 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 Also a debridement including bone of the left lateral foot and a closure which is still sutured. This is a bit surprising. Also apparent debridement of the base of the right third toe wound. 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 I'm not sure exactly where we are in the frame of things here. The wound on the left lateral foot is still sutured also the amputation sites of the left second and right third toe. He is been using Xeroform to the sutured areas silver alginate to the right foot 02/14/18; he continues on Rocephin at dialysis for osteomyelitis. I still don't have a good sense of where we are in the treatment duration. The wounds on the left lateral foot had the sutures removed and this clearly still probes deeply. We debrided the area with a #5 curet. We'll use silver alginate to the wound the area over the right third met head also required debridement of callus skin and subcutaneous tissue and necrotic debris over the wound surface. This tunnels superiorly but I did not unroofed this today. The areas  on the tip of his toes have a crusted surface eschar I did not debridement this either. 02/21/18; he continues on Rocephin at dialysis for osteomyelitis. I don't have a good sense of where we are  and the treatment duration. The wounds on the left lateral foot is callused over on presentation but requires debridement. The area on the right third med head plantar aspect actually is measuring smaller 02/28/18;patient is on Vanco and Fortaz at dialysis, I'm not sure why I thought this was Rocephin above unless it is been changed. I don't have a good sense of time frame here. Using silver collagen to the area on the left lateral foot and right third med head plantar foot 03/07/18; patient is completing bank and Fortaz at dialysis soon. He is using Silver collagen to the area on the left lateral foot and the right third metatarsal head. He has a smaller superficial wound distal to the left lateral foot wound. 03/14/18-he is here in follow-up evaluation for multiple ulcerations to his bilateral feet. He presents with a new ulceration to the plantar aspect of the left foot underneath the blister, this was deroofed to reveal a partial thickness ulcer. He is voicing no complaints or concerns, tolerated dialysis yesterday. We will continue with same treatment plan and he will follow-up next week 03/21/18; the patient has a area on the lateral left foot which still has a small probing area. The overall surface area of the wound is better. He presented with a new ulceration on the left foot plantar fifth metatarsal head last week. The area on the right third metatarsal head appears smaller.using silver alginate to all wound areas. The patient is changing the dressing himself. He is using a [place] forefoot offloading are on the right and a healing sandal on the left 03/28/18; original wound left lateral foot. Still nowhere close to looking like a heeling surface secondary wound on the left lateral foot at the base of his question fifth metatarsal which was new last week Right third metatarsal head. We have been using silver alginate all wounds 04/04/18; the original wound on the left lateral foot again heavy  callus surrounding thick nonviable subcutaneous tissue all requiring debridement. The new wound from 2 weeks ago just near this at the base of the fifth metatarsal head still looks about the same Unfortunately there is been deterioration in the third medical head wound on the right which now probes to bone. I must say this was a superficial wound at one point in time and I really don't have a good frame of reference year. I'll have to go back to look through records about what we know about the right foot. 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 He is also been to see Dr. [name]. Somewhere in here as somebody is ordered a VASCULAR evaluation which was done on 03/22/18. On the right is anterior tibial artery was monophasic triphasic at the posterior tibial artery. On the left anterior tibial artery monophasic posterior tibial artery monophasic. ABI in the right was 1.43 on the left 1.21. TBIs on the right at 0.41 and on the left at 0.32. A vascular consult was recommended and I think has been arranged. 04/11/18; Mr. Leoni has not had an MRI of the right foot since 2017. Recent x-ray of the right foot done in January and February was negative. However he has had a major deterioration in the  wound over the third met head. He is completed antibiotics last week at dialysis [place]. I think he is going to see vascular in follow-up. The areas on the left lateral foot and left plantar fifth metatarsal head both look satisfactory. I debrided both of these areas although the tissue here looks good. The area on the left lateral foot once probe to bone it certainly does not do that now 04/18/18; MRI of the right foot is on Thursday. He has what looks to be serosanguineous purulent drainage coming out of the wound over the right third metatarsal head today. He completed antibiotics bank and Fortaz 2 weeks  ago at dialysis. He has a vascular follow-up with regards to his arterial insufficiency although I don't exactly see when that is booked. The areas on the left foot including the lateral left foot and the plantar left fifth metatarsal head look about the same. 04/25/18-He is here in follow-up evaluation for bilateral foot ulcers. MRI obtained was negative for osteomyelitis. Wound culture was negative. We will continue with same treatment plan he'll follow-up next week 05/02/18; the right plantar foot wound over the third metatarsal head actually looks better than when I last saw this. His MRI was negative for osteomyelitis wound culture was negative. On the left plantar foot both wounds on the plantar fifth metatarsal head and on the lateral foot both are covered in a very hard circumferential callus. 05/09/18; right plantar foot wound over the third metatarsal head stable from last week. Left plantar foot wound over the fifth metatarsal head also stable but with callus around both wound areas The area on the left lateral foot had thick callus over a surface and I had some thoughts about leaving this intact however it felt boggy. We've been using silver alginate all wounds 05/16/18; since the patient was last here he was hospitalized from 5/15 through 5/19. 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 This grew MRSA. He was placed on vancomycin.MRI of the left foot suggested "progressive" osteomyelitis and bone destruction of the cuboid and the base of the fifth metatarsal. Stable erosive changes of the base of the second metatarsal. I'm wondering if they're aware that he had surgery and debridement in the area of the underlying bone previously by Dr. [name]. In any case, He was also revascularized with an angioplasty and stenting of the left tibial peroneal trunk and angioplasty of the left  posterior tibial artery. He is on vancomycin at dialysis. He has a 2 week follow-up with infectious disease. He has vascular surgery follow-up. He was started on Plavix. 05/23/18; the patient's wound on the lateral left foot at the level of the fifth metatarsal head and the plantar wound on the plantar fifth metatarsal head both look better. The area on the right third metatarsal head still has depth and undermining. We've been using silver alginate. He is on vancomycin. He has been revascularized on the left 05/30/18; he continues on vancomycin at dialysis. Revascularized on the left. The area on the left lateral foot is just about closed. Unfortunately the area over the plantar fifth metatarsal head undermining superiorly as does the area over the right third metatarsal head. Both of these significantly deteriorated from last week. He has appointments next week with infectious disease and orthopedics I delayed putting on a cast on the left until those appointments which therefore we made we'll  bring him in on Friday the 14th with the idea of a cast on the left foot 06/06/18; he continues on vancomycin at dialysis. Revascularized on the left. He arrives today with a ballotable swelling just above the area on the left lateral foot where his previous wound was. He has no pain but he is reasonably insensate. The difficult areas on the plantar left fifth metatarsal head looks stable whereas the area on the right third plantar metatarsal head about the same as last week there is undermining here although I did not unroofed this today. He required a considerable debridement of the swelling on the left lateral foot area and I unroofed an abscess with a copious amount of brown purulent material which I obtained for culture. I don't believe during his recent hospitalization he had any further imaging although I need to review this. Previous cultures from this area done in this clinic showed MRSA, I would be  surprised if this is not what this is currently even though he is on vancomycin. 06/13/18; he continues on vancomycin at dialysis however he finishes this on Sunday and then graduates the doxycycline previously prescribed by Dr. [name]. The abscess site on the lateral foot that I unroofed last time grew moderate amounts of methicillin-resistant staph aureus that is both vancomycin and tetracycline sensitive so we should be okay from that regard. He arrives today in clinic with a connection between the abscess site and the area on the lateral foot. We've been using silver alginate to all his wound areas. The right third metatarsal head wound is measuring smaller. Using silver alginate on both wound areas 06/20/18; transitioning to doxycycline prescribed by Dr. [name]. The abscess site on the lateral foot that I unroofed 2 weeks ago grew MRSA. That area has largely closed down although the lateral part of his wound on the fifth metatarsal head still probes to bone. I suspect all of this was connected. On the right third metatarsal head smaller looking wound but surrounded by nonviable tissue that once again requires debridement using silver alginate on both areas 06/27/18; on doxycycline 100 twice a day. The abscess on the lateral foot has closed down. Although he still has the wound on the left fifth metatarsal head that extends towards the lateral part of the foot. The most lateral part of this wound probes to bone Right third metatarsal head still a deep probing wound with undermining. Nevertheless I elected not to debridement this this week If everything is copious static next week on the left I'm going to attempt a total contact cast 07/04/18; he is tolerating his doxycycline. The abscess on the left lateral foot is closed down although there is still a deep wound here that probes to bone. We will use silver collagen under the total contact cast Silver collagen to the deep wound over the right  third metatarsal head is well 07/11/18; he is tolerating doxycycline. The left lateral fifth plantar metatarsal head still probes deeply but I could not probe any bone. We've been using silver collagen and this will be the second week under the total contact cast Silver collagen to deep wound over the right third metatarsal head as well 07/18/18; he is still taking doxycycline. The left lateral fifth plantar metatarsal this week probes deeply with a large amount of exposed bone. Quite a deterioration. He required extensive debridement. Specimens of bone for pathology and CNS obtained Also considerable debridement on the right third metatarsal head 07/25/18; bone for pathology last week from  the lateral left foot showed osteomyelitis. CandS of the bone Enterobacter. And Klebsiella. He is now on ciprofloxacin and the doxycycline is stopped [Dr. [name] of infectious disease] area He has an appointment with Dr. [name] tomorrow I'm going to leave the total contact cast off for this week. May wish to try to reapply that next week or the week after depending on the wound bed looks. I'm not sure if there is an operative option here, previously is followed with Dr. [name] 08/01/18 on evaluation today patient presents for reevaluation. He has been seen by infectious disease, Dr. [name], and he has placed the patient back on Ceftaz currently to be given to him at dialysis three times a week. 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 Fortunately there does not appear to be the evidence of infection at this point in time. Overall the patient has been tolerating the dressing changes without complication. We are no longer due lives in the cast of left foot secondary to the infection obviously. 08/10/18 on evaluation today patient appears to be doing rather well all things considered in regard to his left plantar foot ulcer. He does have a  significant callous on the lateral portion of his left foot he wonders if I can help clean that away to some degree today. Fortunately he is having no evidence of infection at this time which is good news. No fevers chills noted. With that being said his right plantar foot actually does have a significant callous buildup around the wound opening this seems to not be doing as well as I would like at this point. I do believe that he would benefit from sharp debridement at this site. Subsequently I think a total contact cast would be helpful for the right foot as well. 08/17/18; the patient arrived today with a total contact cast on the right foot. This actually looks quite good. He had gone to see Dr. Megan Salon of infectious disease about the osteomyelitis on the left foot. I think he is on IV Fortaz at dialysis although I am not exactly sure of the rationale for the Fortaz at the time of this dictation. He also arrived in today with a swelling on the lateral left foot this is the site of his original wounds in fact when I first saw this man when he was under Dr. Ardeen Garland care I think this was the site of where the wound was located. It was not particularly tender however using a small scalpel I opened it to [place] some moderate amount of purulent drainage. 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 Last MRI was in May that showed progressive osteomyelitis and bone destruction of the cuboid and base of the fifth metatarsal. Last week he had an abscess over this same area this was removed. Culture was negative. We are continuing with a total contact cast to the area on the third metatarsal head on the right and making some good progress here. The patient asked me again about renal transplant. He is not on the list because of the open wounds. 08/31/18; apparently the patient had  an interruption in the [place] but he is now back on this at dialysis. Apparently there was a misunderstanding and Dr. Crissie Figures orders. Due to have the MRI of the left foot tonight. The  area on the right foot continues to have callus thick skin and subcutaneous tissue around the wound edge that requires constant debridement however the wound is smaller we are using a total contact cast in this area. Alginate all wounds 09/07/18; without much surprise the MRI of his left foot showed osteomyelitis in the reminiscent of his fourth metatarsal but also the third metatarsal. She arrives in clinic today with the right foot and a total contact cast. There was purulent drainage coming out of the wound which I have cultured. Marked deterioration here with undermining widely around the wound orifice. Using pickups and a scalpel I remove callus and subcutaneous tissue from a substantial new opening. In a similar fashion the area on the left lateral foot that was blistered last week I have opened this and remove skin and subcutaneous tissue from this area to expose a obvious new wound. I think there is extension and communication between all of this on the left foot. The patient is on Fortaz at dialysis. Culture done of the right foot. We are clearly not making progress here. We have made him an appointment with Dr. [name] of orthopedics. I think an amputation of the left leg may be discussed. I don't think there is anything that can be done with foot salvage. 09/14/18; culture from the right foot last time showed a very resistant MRSA. This is resistant to doxycycline. I'm going to try to get linezolid at least for a week. He does not have a appointment with Dr. [name] yet. 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 I'll send a message to Dr. [name] of infectious disease. I want the patient to see Dr. [name] to go over the pros and cons of an  amputation which I think will be a BKA. The patient had a question about whether this is curative or not. I told him that I thought it would be although spread of staph aureus infection is not unheard of. MRI of the right foot was done at the end of April 2019 did not show osteomyelitis. The patient last saw Dr. [name] on 9/3. I'll send Dr. [name] a message. I would really like him to weigh the pros and cons of a BKA on the left otherwise he'll probably need IV antibiotics and perhaps hyperbaric oxygen again. He has not had a good response to this in the past. His MRI earlier this month showed progressive damage in the remnants of the fourth and third metatarsal 09/21/2018; sees Dr. [name] of orthopedics next week to discuss a left BKA in response to the osteomyelitis in the left foot. Using silver alginate to all wound areas. He is completing the Zyvox I did put in for him after last culture showed MRSA 09/29/2018; patient saw Dr. [name] of orthopedics discussed the osteomyelitis in the left foot third and fourth metatarsals. He did not recommend urgent surgery but certainly stated the only surgical option would be a BKA. He is communicating with Dr. [name]. The problem here is the instability of the areas on his foot which constantly generate draining abscesses. I will communicate with Dr. [name] about this. He has an appointment on 10/23 10/05/2018; patient sees Dr. [name] on 10/23. I have sent him a secure message not ordered any additional antibiotics for now. Patient continues to have 2 open areas on the left plantar foot at the fifth metatarsal head and the lateral aspect of the foot. These have  not changed all that much. The area on the right third met head still has thick callus and surrounding subcutaneous tissue we have been using silver alginate 10/12/2018; patient sees Dr. [name] or colleague on 10/23. 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 The area on the right plantar foot is just about closed. The superior wound on the left fifth metatarsal head is callused over but I am not sure if this is closed. The area below it is about the same. Considerable amount of callus on the lateral foot. We have been using silver alginate to the wounds 10/19/2018; the patient saw Dr. [name] on 10/22. He wishes to try and continue to save the left foot. He has been given 6 weeks of oral ciprofloxacin. We continue to put a total contact cast on the right foot. The area over the fifth metatarsal head on the left is callused over/perhaps healed but I did not remove the callus to find out. He still has the wound on the left lateral midfoot requiring debridement. We have been using silver alginate to all wound areas 10/26/2018 On the left foot our intake nurse noted some purulent drainage from the inferior wound [currently the only one that is not callused over] On the right foot even though he is in total contact cast a considerable amount of thick black callus and surface eschar. On this side we have been using silver alginate under a total contact cast. he remains on ciprofloxacin as prescribed by Dr. [name] 11/02/2018; culture from last week which is done of the probing area on the left midfoot wound showed MRSA "few". I am going to need to contact Dr. [name] which I will do today I think he is going to need IV antibiotics again. The area on the left foot which was callused on the side is clearly separating today all of this was removed a copious amounts of callus and necrotic subcutaneous tissue. The area on the right plantar foot actually remained healthy looking with a healthy granulated base. We are using silver alginate to all wound areas 11/09/2018; unfortunately neither 1 of the patient's wounds areas looks at all satisfactory. On the left he has considerable necrotic debris from the plantar wound laterally over  the foot. I removed copious amounts of material including callus skin and subcutaneous tissue. On the right foot he arrives with undermining and frankly purulent drainage. Specimen obtained for culture and debridement of the callused skin and subcutaneous tissue from around the circumference. Because of this I cannot put him back in a total contact cast but to be truthful we are really unfortunately not making a lot of progress I did put in a secure text message to Dr. [name] wondering about the MRSA on the left foot. He suggested vancomycin although this is not started. I was left wondering if he expected me to send this into dialysis. Has we have more purulence on the right side wait for that result before calling dialysis 11/16/2018; I called dialysis earlier this week to get the vancomycin started. I think he got the first dose on Tuesday. The patient tells me that he fell earlier this week twisting his left foot and ankle and he is swelling. He continues to not look well. He had blood cultures done at dialysis on Tuesday he is not been informed of the results. 12/07/2018; the patient was admitted to hospital from 11/22/2018 through 12/02/2018. He underwent a left  BKA. Apparently had staph sepsis. In the meantime being off the right foot this is closed over. He follows up with Dr. 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 Subsequent to this he actually had multiple falls in fact he tells me to following which calls the wound to be his apparently. He has been seeing Dr. [name] and his physician assistant in the interim. They actually did want him to come back for reevaluation to see if there's anything we can do for me wound care perspective the help this area to heal more appropriately. The patient states that he does not have a tremendous amount of pain which is good news. No  fevers, chills, nausea, or vomiting noted at this time. We have gotten approval from Dr. [name] office had [place] for [place] to treat the patient for his stop wound. This is due to the fact that the patient was in the 90 day postop global. 1/21; the patient does not have an open wound on the right foot although he does have some pressure areas that will need to be padded when he is transferring. The dehisced surgical wound looks clean although there is some undermining of 1.5 cm superiorly. 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 This appears healthy but there is undermining. TheraSkin #1 2/11; TheraSkin #2 2/25; TheraSkin #3. Wound is measuring smaller 3/10; TheraSkin #4 wound is measuring smaller 3/24; TheraSkin #5 wound is measuring smaller and looks healthy. 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 He has no open area on the right foot although he does have some callused area over the original third metatarsal head wound. The third metatarsal is also subluxed on the right foot. I have warned him today that he cannot consider wearing a prosthesis for at least a month but he can go for measurements. He is going to need to keep the right foot padded is much as possible in his diabetic shoe indefinitely READMISSION 06/12/2019 Mr. Bundick is a type II diabetic on dialysis. He has been in this clinic multiple times with wounds on his bilateral lower extremities. 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 He also has a history of wounds on his right foot but tells me that everything is going well here. He actually came in here with his prosthesis walking with a cane. We were all really quite gratified to see this He states over the last several weeks he has had a painful area at the  tip of the left third finger. His dialysis shunt in his is in the left upper arm and this particularly hurts during dialysis when his fingers get numb and there is a lot of pain in the tip of his left third finger. I believe he is seen vascular surgery. He had a Doppler done to evaluate for a dialysis steal syndrome. He is going for a banding procedure 1 week from today towards the left AV fistula. I think if that is unsuccessful they will be looking at creating a new shunt. He had a course of doxycycline when he which he finished about a week ago. He has been using Bactroban to the finger 6/25; the patient  had his banding procedure and things seem to be going better. He is not having pain in his hand at dialysis. The area on the tip of his finger seems about closed. He did however have a paronychia I the last time I saw him. I have been advising him to use topical Bactroban washing the finger. Culture I did last time showed a few staph epidermidis which I was willing to dismiss as superficial skin contaminant. He arrives today with the finger wound looking better however the paronychia a seems worse 7/9; 2-week follow-up. He does not have an open wound on the tip of his third left finger. I thought he had an initial ischemic wound on the tip of the finger as well as a paronychia a medially. All of this appears to be closed. 8/6-Patient returns after 3 weeks for follow-up and has a right second met head plantar callus with a significant area of maceration hiding an ulcer ABI repeated today on right - 1.26 8/13-Patient returns at 1 week, the right second met head plantar wound appears to be slightly better, it is surrounded by the callus area we are using silver alginate 8/20; the patient came back to clinic 2 weeks ago with a wound on the right second metatarsal head. He apparently told me he developed callus in this area but also went away from his custom shoes to a pair of running shoes. Using  silver alginate. 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 There might be much we can do to offload this although the patient tells me he has a wheelchair that he is using to try and stay off the wound is much as possible 8/27; right second metatarsal head. Still small punched out area with thick callus and subcutaneous tissue around the wound. We have been using silver collagen. This is always been an issue with this man's wound especially on this foot 9/3; right second met head. Still the same punched-out area with thick callus and subcutaneous tissue around the wound removing the circumference demonstrates repetitively undermining area. I have removed all the subcutaneous tissue associated with this. We have been using silver collagen 9/15; right second metatarsal head. Same punched-out thick callus and subcutaneous tissue around the wound area. Still requiring debridement we have been using silver collagen I changed back to silver alginate X-ray last time showed no evidence of osteomyelitis. Culture showed Klebsiella and he was started and Keflex apparently just 4 days ago 9/24; right second metatarsal head. He is completed the antibiotics I gave him for 10 days. Same punched-out thick callus and subcutaneous tissue around the area. Still requiring debridement. We have been using silver alginate. The area itself looks swollen and this could be just [place] that comes on this area with walking on a prosthesis on the left however I think he needs an MRI and I am going to order that today. There is not an option to offload this further 10/1; right second plantar metatarsal head. MRI booked for October 6. Generally looking better today using silver alginate 10/8; right second plantar metatarsal head. MRI that was done on 10/6 was negative for osteomyelitis noted prior amputations of the second and fourth toes at the MTP. Prior amputation of the third toe to the base of the  middle phalanx. Prior amputation of the fifth toe to the head of the metatarsal. 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 He does not  recall about hearing about this. He did not have any pain but then again he has reduced sensation from diabetic neuropathy 10/15; right second plantar metatarsal head. Still in the same condition callus nonviable subcutaneous tissue which cleans up nicely with debridement but reforms by the next week. The patient tells me that he is offloading this is much as he can including taking his wheelchair to dialysis. We have been using silver alginate, changed to silver collagen today 10/22; right second plantar metatarsal head. Wound measures smaller but still thick callus around this wound. Still requiring debridement 11/5 right second plantar metatarsal head. Less callus around the wound circumference but still requiring debridement. There is still some undermining which I cleaned out the wound looks healthy but still considerable punched out depth with a relatively small circumference to the wound there is no palpable bone no purulent drainage Electronic Signature(s) Signed: 11/01/2019 5:45:12 PM By: Linton Ham MD Entered By: Linton Ham on 11/01/2019 14:00:11 -------------------------------------------------------------------------------- Physical Exam Details Patient Name: Date of Service: ANGELA, PLATNER 11/01/2019 12:30 PM Medical Record [id number] Patient Account Number: [id number] Date of Birth/Sex: Treating RN: [birth year] (68 y.o. [name] Primary Care Provider: [name] Other Clinician: Referring Provider: Treating Provider/Extender:Gustaf Mccarter, Jeronimo Greaves, Buena Irish in Treatment: 20 Constitutional Sitting or standing Blood Pressure is within target range for patient.. Pulse regular and within target range for patient.Marland Kitchen Respirations regular, non-labored and within target  range.. Temperature is normal and within the target range for the patient.Marland Kitchen Appears in no distress. Notes Wound exam; debridement of callus is some subcutaneous tissue. I am able to get down to a clean base of this although is depth. There is no purulence no surrounding erythema. Used a #3 [employer]) Signed: 11/01/2019 5:45:12 PM By: Linton Ham MD Entered By: Linton Ham on 11/01/2019 14:00:51 -------------------------------------------------------------------------------- Physician Orders Details Patient Name: Date of Service: DAYMEN, HASSEBROCK 11/01/2019 12:30 PM Medical Record [id number] Patient Account Number: [id number] Date of Birth/Sex: Treating RN: [birth year] (68 y.o. 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 Dressing Change Frequency Wound #41 Right Metatarsal head second Change Dressing every other day. Skin Barriers/Peri-Wound Care Barrier cream - as needed for maceration, wetness, or redness. Wound Cleansing May shower and wash wound with soap and water. Primary Wound Dressing Wound #41 Right Metatarsal head second Silver Collagen - moisten with hydrogel ([place] jelly) or saline Secondary Dressing Wound #41 Right Metatarsal head second Kerlix/Rolled Gauze Dry Gauze Other: - felt to periwound and place one below wound. lightly wrap coban to hold dressing in place. felt / pad wound Edema Control Avoid standing for long periods of time Elevate legs to the level of  the heart or above for 30 minutes daily and/or when sitting, a frequency of: - throughout the day. Off-Loading Other: - felt to periwound and place one below wound. Additional Orders / Instructions Other: - minimize walking and standing on foot to aid in wound healing and callus buildup. Electronic Signature(s) Signed: 11/01/2019 5:16:49 PM By: Kela Millin Signed: 11/01/2019 5:45:12 PM By: Linton Ham MD Entered By: Kela Millin on 11/01/2019 13:46:40 -------------------------------------------------------------------------------- Problem List Details Patient Name:  Date of Service: OTHON, GUARDIA 11/01/2019 12:30 PM Medical Record [id number] Patient Account Number: [id number] Date of Birth/Sex: Treating RN: [birth year] (68 y.o. 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, 06/12/2019 No Yes other extremity Inactive Problems ICD-10 Code Description Active Date Inactive Date L03.114 Cellulitis of left upper limb 06/12/2019 06/12/2019 L98.498 Non-pressure chronic ulcer of skin of other sites with other 06/12/2019 06/12/2019 specified severity S61.209D Unspecified open wound of unspecified finger without damage 06/12/2019 06/12/2019 to nail, subsequent encounter Resolved Problems Electronic Signature(s) Signed: 11/01/2019 5:45:12 PM By: Linton Ham MD Entered By: Linton Ham on 11/01/2019 13:58:58 -------------------------------------------------------------------------------- Progress Note Details Patient  Name: Date of Service: CLARA, SMOLEN 11/01/2019 12:30 PM Medical Record [id number] Patient Account Number: [id number] Date of Birth/Sex: Treating RN: Aug 09, 1951 (68 y.o. [name] Primary Care Provider: [name] Other Clinician: Referring Provider: Treating Provider/Extender:Tyjae Issa, Jeronimo Greaves, Buena Irish in Treatment: 20 Subjective History of Present Illness (HPI) The following HPI elements were documented for the patient's wound: Location: Patient presents with a wound to bilateral feet. Quality: Patient reports experiencing essentially no pain. Severity: Mildly severe wound with no evidence of infection Duration: Patient has had the wound for greater than 2 weeks prior to presenting for treatment The patient is a pleasant 68 yrs old bm here for evaluation of ulcers on the plantar aspect of both feet. 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 He has a left arm graft for access. He has been trying to stay off his feet for weeks but does not seem to have any improvement in the wound. He has been seeing someone at the foot center and was referred to the Wound Care center for further evaluation. 12/30/15 the patient has 3 wounds one over the right first metatarsal head and 2 on the left foot at the left fifth and lleft first metatarsal head. All of these look relatively similar. The one over the left fifth probe to bone I could not prove that any of the others did. I note his MRI in November that did not show osteomyelitis. His peripheral pulses seem robust. All of these underwent surgical debridement to remove callus nonviable skin and subcutaneous tissue 01/06/16; the patient had his sutures removed from the right fifth ray amputation. There may be a small open part of this superiorly but otherwise the incision looks good. Areas over his right first, left first and left fifth metatarsal head all underwent  surgical debridement as a varying degree of callus, skin and nonviable subcutaneous tissue. The area that is most worrisome is the right fifth metatarsal head which has a wound probes precariously close to bone. There is no purulent drainage or erythema 01/13/16; I'm not exactly sure of the status of the right fifth ray amputation site however he follows with Dr. [name] later this week. The area over his right first plantar metatarsal head, left first and fifth plantar metatarsal head are all in the same status. Thick circumferential callus, nonviable subcutaneous tissue. Culture of the left fifth did not culture last week 01/20/16; all of the patient's wounds appear and roughly the same  state although his amputation site on the right lateral foot looks better. His wounds over the right first, left first and left fifth metatarsal heads all underwent difficult surgical debridement removing circumferential callus nonviable skin and subcutaneous tissue. There is no overt evidence of infection in these areas. MRI at the end of December of the left foot did not show osteomyelitis, right foot showed osteomyelitis of the right fifth digit he is now status post amputation. 01/27/16 the patient's wounds over his plantar first and fifth metatarsal heads on the left all appear better having been started on a total contact cast last week. They were debridement of circumferential callus and nonviable subcutaneous tissue as was the wound over the first metatarsal head. His surgical incision on the right also had some light surface debridement done. 03/23/2016 -- the patient was doing really well and most of his wounds had almost completely healed but now he came back today with a history of having a discharge from the area of his right foot on the plantar aspect and also between his first and second toe. He also has had some discharge from the left foot. Addendum: I spoke to the PA [name] at the  dialysis center whose fax number is [phone]. We discussed the infection the patient has and she will put the patient on vancomycin and Fortaz until the final culture report is back. We will fax this as soon as available. 03/30/2016 -- left foot x-ray IMPRESSION:No definitive osteomyelitis noted. X-ray of the right foot -- IMPRESSION: 1. Soft tissue swelling. Prior amputation right fifth digit. No acute or focal bony abnormality identified. If osteomyelitis remains a clinical concern, MRI can be obtained. 2. Peripheral vascular disease. His culture reports have grown an MSSA -- and we will fax this report to his hemodialysis center. He was called in a prescription of oral doxycycline but I have told her not to fill this in as he is already on IV antibiotics. 04/06/2016 -- a few days ago, I spoke to the hemodialysis center nurse who had stopped the IV antibiotics and he was given a prescription for doxycycline 100 mg by mouth twice a day for a week and he is on this at the present time. 05/11/2016 -- he has recently seen his PCP this week and his hemoglobin A1c was 7. He is working on his paperwork to get his orthotic shoes. 05/25/2016 -- -- x-ray of the left foot IMPRESSION:No acute bony abnormality. No radiographic changes of acute osteomyelitis. No change since prior study. X-ray of the right foot -- IMPRESSION: Postsurgical changes are seen involving the fifth toe. No evidence of acute osteomyelitis. he developed a large blister on the medial part of his right foot and this opened out and drain fluid. 06/01/2016 -- he has his MRI to be done this afternoon. 06/15/2016 -- MRI of the left forefoot without contrast shows 2 separate regions of cutaneous and subcutaneous edema and possible ulceration and blistering along the ball of the foot. No obvious osteomyelitis identified. MRI of the right foot showed cutaneous and subcutaneous thickening plantar to the first digit sesamoid with  an ulcer crater but no underlying osteomyelitis is identified. 06/22/2016 -- the right foot plantar ulcer has been draining a lot of seropurulent material for the last few days. 06/30/2016 -- spoke to the dialysis center and I believe I spoke to Eugene a PA at the center who discussed with me and agreed to putting Legrand Como on vancomycin until his cultures arrive. On review  of his culture report no WBCs were seen or no organisms were seen and the culture was reincubated for better growth. The final report is back and there were no predominant growth including Streptococcus or Staphylococcus. Clinically though he has a lot of drainage from both wounds a lot of undermining and there is further blebs on the left foot towards the interspace between his first and second toe. 08/10/2016 -- the culture from the right foot showed normal skin flora and there was no Staphylococcus aureus was group A streptococcus isolated. 09/21/2016 -- -- MRI of the right foot was done on 09/13/2016 - IMPRESSION: Findings most consistent with acute osteomyelitis throughout the great toe, sesamoid bones and plantar aspect of the head of the first metatarsal. Fluid in the sheath of the flexor tendon of the great toe could be sympathetic but is worrisome for septic tenosynovitis. First MTP joint effusion worrisome for septic joint. He was admitted to the hospital on 09/11/2016 and treated for a fever with vancomycin and cefepime. He was seen by Dr. Bobby Rumpf of infectious disease who recommended 6 weeks treatment with vancomycin and ceftazidime with his hemodialysis and to continue to see as in the wound clinic. Vascular consult was pending. The patient was discharged home on 09/14/2016 and he would continue with IV antibiotics for 6 weeks. 09/28/16 wound appears reasonably healthy. Continuing with total contact cast 10/05/16 wound is smaller and looking healthy. Continue with total contact cast. He continues on IV  antibiotics 10/12/2016 -- he has developed a new wound on the dorsal aspect of his left big toe and this is a superficial injury with no surrounding cellulitis. He has completed 30 days of IV antibiotics and is now ready to start his hyperbaric oxygen therapy as per his insurance company's recommendation. 10/19/2016 -- after the cast was removed on the right side he has got good resolution of his ulceration on the right plantar foot. he has a new wound on the left plantar foot in the region of his fourth metatarsal and this will need sharp debridement. 10/26/2016 -- Xray of the right foot complete: IMPRESSION: Changes consistent with osteomyelitis involving the head of the first metatarsal and base of the first proximal phalanx. The sesamoid bones are also likely involved given their positioning. 11/02/2016 -- there still awaiting insurance clearance for his hyperbaric oxygen therapy and hopefully he will begin treatment soon. 11/09/2016 -- started with hyperbaric oxygen therapy and had some barotrauma to the right ear and this was seen by ENT who was prescribed Afrin drops and would probably continue with HBO and he is scheduled for myringotomy tubes on Friday 11/16/2016 -- he had his myringotomy tubes placed on Friday and has been doing much better after that with some fluid draining out after hyperbaric treatment today. The pain was much better. 11/30/2016 -- over the last 2 days he noticed a swelling and change of color of his right second toe and this had been draining minimal fluid. 12/07/2016 -- x-ray of the right foot -- IMPRESSION:1. Progressive ulceration at the distal aspect of the second digit with significant soft tissue swelling and osseous changes in the distal phalanx compatible with osteomyelitis. 2. Chronic osteomyelitis at the first MTP joint. 3. Ulcerations at the second and third toes as well without definite osseous changes. Osteomyelitis is not excluded. 4. Fifth digit  amputation. On 12/01/2016 oo I spoke to Dr. Bobby Rumpf, the infectious disease specialist, who kindly agreed to treat this with IV antibiotics and he would call  in the order to the dialysis center and this has been discussed in detail with the patient who will make the appropriate arrangements. The patient will also book an appointment as soon as possible to see Dr. [name] in the office. Of note the patient has not been on antibiotics this entire week as the dialysis center did not receive any orders from Dr. [name]. I got in touch with Dr. [name] who tells me the patient has an appointment to see him this coming Wednesday. 12/14/2016 -- the patient is on ceftazidime and vancomycin during his dialysis, and I understand this was put on by his nephrologist Dr. Raliegh Ip possibly after speaking with infectious disease Dr. [name] 12/23/16; patient was on my schedule today for a wound evaluation as he had difficulties with his schedule earlier this week. I note that he is on vancomycin and ceftazidine at dialysis. In spite of this he arrives today with a new wound on the base of the right second toe this easily probes to bone. The known wound at the tip of the second toe With this area. He also has a superficial area on the medial aspect of the third toe on the side of the DIP. This does not appear to have much depth. The area on the plantar left foot is a deep area but did not probe the bone 12/28/16 -- he has brought in some lab work and the most recent labs done showed a hemoglobin of 12.1 hematocrit of 36.3, neutrophils of 51% WBC count of 5.6, BN of 53, albumin of 4.1 globulin of 3.7, vancomycin 13 g per mL. 01/04/2017 -- he saw Dr. [name] who has recommended a amputation of the right second toe and he is awaiting this date. He sees Dr. Bobby Rumpf of infectious disease tomorrow. The bone culture taken on 12/28/2016 had no growth in 2 days. 01/11/2017 -- was seen by Dr. Bobby Rumpf  regarding the management and has recommended a eval by vascular surgeons. He recommended to continue the antibiotics during his hemodialysis. Xray of the left foot -- IMPRESSION: No acute fracture, dislocation, or osseous erosion identified. 01/18/2017 -- he has his vascular workup later today and he is going to have his right foot second toe amputation this coming Friday by Dr. [name]. We have also put in for a another 30 treatments with hyperbaric oxygen therapy 01/25/2017 -- I have reviewed Dr. Donnetta Hutching is vascular report from last week where he reviewed him and thought his vascular function was good enough to heal his amputation site and no further tests were recommended. He also had his orthopedic related to surgery which is still pending the notes and we will review these next week. 02/01/2017 -- the operative remote of Dr. 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 02/08/2017 --he was seen by Dr. [name] on 02/03/2017 -- patient is on [place]. after a thorough review he had recommended to continue with antibiotics during hemodialysis. The patient was seen by Dr. [name], but I do not find any follow-up note on the electronic medical record. he has removed the dressing over the right foot to remove the sutures and asked him to see him back in a  month's time 02/22/2017 -- patient has been febrile and has been having symptoms of the upper respiratory tract infection but has not been checked for the flu and it's been over 4 days now. He says he is feeling better today. Some drainage between his left first and second toe and this needed  to be looked at 03/08/2017 -- was seen by Dr. Bobby Rumpf on 03/07/2017 after review he will stop his antibiotics and see him back in a month to see if he is feeling well. 03/15/2017 -- he has completed his course of hyperbaric oxygen therapy and is doing fine with his health otherwise. 03/22/2017 -- his nutritionist at the dialysis center has recommended a protein supplement to help build his collagen and we will prescribe this for him when he has the details 05/03/2017 -- he recently noticed the area on the plantar aspect of his fifth metatarsal head which opened out and had minimal drainage. 05/10/2017 -- -- x-ray of the left foot -- IMPRESSION:1. No convincing conventional radiographic evidence of active osteomyelitis.2. Active soft tissue ulceration at the tip of the fifth digit, and at the lateral aspect of the foot adjacent the base of the fifth metatarsal. 3. Surgical changes of prior fourth toe amputation. 4. 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 5. Small vessel atherosclerotic vascular calcifications. 6. 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 ======= Readmission after 5 weeks: 06/15/2017 -- the patient returns after 5 weeks having had an MRI done on 05/17/2017 MRI of the left foot without contrast showed soft tissue ulcer overlying the base of the fifth metatarsal. 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 No drainable fluid collection to suggest an abscess. He was in hospital between 05/16/2017 and 05/25/2017 -- and on discharge was asked to follow-up with Dr. [name] and Dr. [name]. He was treated 2 weeks post discharge with vancomycin plus ceftriaxone with hemodialysis and oral metronidazole 500 mg 3 times a day. 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 last  hemoglobin A1c was 6.7% He had a postoperative MR of the foot on 05/23/2017 -- which showed no new areas of cortical bone loss, edema or soft tissue ulceration to suggest osteomyelitis. the patient has completed his course of IV antibiotics during dialysis and is to see the infectious disease doctor tomorrow. Dr. [name] had seen him after suture removal and asked him to keep the wound with a dry dressing 06/21/2017 -- he was seen by Dr. Bobby Rumpf of infectious disease on 06/16/2017 -- he stopped his Flagyl and will see him back in 6 weeks. He was asked to follow-up with Dr. [name] and with the wound clinic clinic. 07/05/17; bone biopsy from last time showed acute osteomyelitis. This is from the reminiscent in the left fifth metatarsal. Culture result is apparently still pending [holding for anaerobe}. From my understanding in this case this is been a progressive necrotic wound which is deteriorated markedly over the last 3 weeks since he returned here. He now has a large area of exposed bone which was biopsied and cultured last week. Dr. Graylon Good has put him on vancomycin and Fortaz during his hemodialysis and Flagyl orally. He is to see Dr. [name] next week 07/19/2017 -- the patient was reviewed by Dr. [name] of orthopedics who reviewed the case in detail and agreed with the plan to continue  with IV antibiotics, aggressive wound care and hyperbaric oxygen therapy. He would see him back in 3 weeks' time 08/09/2017 -- saw Dr. Bobby Rumpf on 08/03/2017 -he was restarted on his antibiotics for 6 weeks which included vanco, ceftaz and Flagyl. he recommended continuing his antibiotics for 6 weeks and reevaluate his completion date. He would continue with wound care and hyperbaric oxygen therapy. 08/16/2017 -- I understand he will be completing his 6 weeks of antibiotics sometime later this week. 09/19/2017 -- he has been without his wound VAC for the last week due to lack of supplies. He has  been packing his wound with silver alginate. 10/04/2017 -- he has an appointment with Dr. [name] tomorrow and hence we will not apply the wound VAC after his dressing changes today. 10/11/2017 -- he was seen by Dr. Bobby Rumpf on 10/05/2017, noted that the patient was currently off antibiotics, and after thorough review he recommended he follow-up with the wound care as per plan and no further antibiotics at this point. 11/29/17; patient is arrived for the wound on his left lateral foot.Marland Kitchen He is completed IV antibiotics and 2 rounds of hyperbaric oxygen for treatment of underlying osteomyelitis. He arrives today with a surface on most of the wound area however our intake nurse noted drainage from the superior aspect. This was brought to my attention. 12/13/2017 -- the right plantar foot had a large bleb and once the bleb was opened out a large callus and subcutaneous debris was removed and he has a plantar ulcer near the fourth metatarsal head 12/28/17 on evaluation today patient appears to be doing acutely worse in regard to his left foot. The wound which has been appearing to do better as now open up more deeply there is bone palpable at the base of the wound unfortunately. He tells me that this feels "like it did when he had osteomyelitis previously" he also noted that his second toe on the left foot appears to be doing worse and is swollen there does appear to be some fluid collected underneath. His right foot plantar ulcer appears to be doing somewhat better at this point and there really is no complication at the site currently. No fevers, chills, nausea, or vomiting noted at this time. Patient states that he normally has no pain at this site however. Today he is having significant pain. 01/03/18; culture done last week showed methicillin sensitive staph aureus and group B strep. He was on Septra however he arrives today with a fever of 101. He has a functional dialysis shunt in his left arm  but has had no pain here. No cough. He still makes some urine no dysuria he did have abdominal pain nausea and diarrhea over the weekend but is not had any diarrhea since yesterday. Otherwise he has no specific complaints 01/05/18; the patient returns today in follow-up for his presentation from 1/8. At that point he was febrile. I gave him some Levaquin adjusted for his dialysis status. He tells me the fever broke that night and it is not likely that this was actually a wound infection. He is gone on to have an MRI of the left foot; this showed interval development of an abnormal signal from the base of the third and fourth metatarsal and in the cuboid reminiscent consistent with osteomyelitis. There is no mention of the left second toe I think which was a concern when it was ordered. The patient is taking his Levaquin 01/10/18; the patient has completed his antibiotics today.  The area on the left lateral foot is smaller but it still probes easily to bone. He has underlying osteomyelitis here and sees Dr. Megan Salon of infectious disease this week ooThe area on the right third plantar metatarsal head still requires debridement not much change in dimensions [facility] has a new wound on the medial tip of his right third toe again this probes to bone. He only noticed this 2 days ago 01/17/18; the patient saw Dr. [name] last week and he is back on Vanco and Fortaz at dialysis. 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 We are previously seeing him for a wound also on the base of the fourth med head on the right. X- ray of this area did not show osteomyelitis in relation to a new wound on the tip of his right third toe. Again this probes to bone. Finally his second toe on the left which didn't really show anything on the MRI at least no report appears to have a separated cutaneous area which I  think is going to come right off the tip of his toe and leave exposed bone. Whether this is infectious or ischemic I am not clear Culture I did from the third toe last week which was his new wound was negative. Plain x-ray on the right foot did not show osteomyelitis in the toes 01/24/18; the patient is going went to see Dr. [name]. He is going to have a amputation of the left second and right third toe although paradoxically the left second toe looks better than last week. We have treating a deep probing wound on the left lateral foot and the area over the third metatarsal head on the right foot. 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 Also a debridement including bone of the left lateral foot and a closure which is still sutured. This is a bit surprising. Also apparent debridement of the base of the right third toe wound. 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 I'm not sure exactly where we are in the frame of things here. The wound on the left lateral foot is still sutured also the amputation sites of the left second and right third toe. He is been using Xeroform to the sutured areas silver alginate to the right foot 02/14/18; he continues on Rocephin at dialysis for osteomyelitis. I still don't have a good sense of where we are in the treatment duration. The wounds on the left lateral foot had the sutures removed and this clearly still probes deeply. We debrided the area with a #5 curet. We'll use silver alginate to the wound oothe area over the right third met head also required debridement of callus skin and subcutaneous tissue and necrotic debris over the wound surface. This tunnels superiorly but I did not unroofed this today. ooThe areas on the tip of his  toes have a crusted surface eschar I did not debridement this either. 02/21/18; he continues on Rocephin at dialysis for osteomyelitis. I don't have a good sense of where we are and the treatment duration. The wounds on the left lateral foot is callused over on presentation but requires debridement.  The area on the right third med head plantar aspect actually is measuring smaller 02/28/18;patient is on Vanco and Fortaz at dialysis, I'm not sure why I thought this was Rocephin above unless it is been changed. I don't have a good sense of time frame here. Using silver collagen to the area on the left lateral foot and right third med head plantar foot 03/07/18; patient is completing bank and Fortaz at dialysis soon. He is using Silver collagen to the area on the left lateral foot and the right third metatarsal head. He has a smaller superficial wound distal to the left lateral foot wound. 03/14/18-he is here in follow-up evaluation for multiple ulcerations to his bilateral feet. He presents with a new ulceration to the plantar aspect of the left foot underneath the blister, this was deroofed to reveal a partial thickness ulcer. He is voicing no complaints or concerns, tolerated dialysis yesterday. We will continue with same treatment plan and he will follow-up next week 03/21/18; the patient has a area on the lateral left foot which still has a small probing area. The overall surface area of the wound is better. He presented with a new ulceration on the left foot plantar fifth metatarsal head last week. The area on the right third metatarsal head appears smaller.using silver alginate to all wound areas. The patient is changing the dressing himself. He is using a [place] forefoot offloading are on the right and a healing sandal on the left 03/28/18; original wound left lateral foot. Still nowhere close to looking like a heeling surface oosecondary wound on the left lateral foot at the base of his question  fifth metatarsal which was new last week ooRight third metatarsal head. ooWe have been using silver alginate all wounds 04/04/18; the original wound on the left lateral foot again heavy callus surrounding thick nonviable subcutaneous tissue all requiring debridement. The new wound from 2 weeks ago just near this at the base of the fifth metatarsal head still looks about the same ooUnfortunately there is been deterioration in the third medical head wound on the right which now probes to bone. I must say this was a superficial wound at one point in time and I really don't have a good frame of reference year. I'll have to go back to look through records about what we know about the right foot. 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 He is also been to see Dr. [name]. Somewhere in here as somebody is ordered a VASCULAR evaluation which was done on 03/22/18. On the right is anterior tibial artery was monophasic triphasic at the posterior tibial artery. On the left anterior tibial artery monophasic posterior tibial artery monophasic. ABI in the right was 1.43 on the left 1.21. TBIs on the right at 0.41 and on the left at 0.32. A vascular consult was recommended and I think has been arranged. 04/11/18; Mr. Coke has not had an MRI of the right foot since 2017. Recent x-ray of the right foot done in January and February was negative. However he has had a major deterioration in the wound over the third met head. He is completed antibiotics last week at dialysis [place]. I think he is going to see vascular in follow-up. The areas on the left lateral foot and left plantar fifth metatarsal head both look satisfactory. I debrided both of these areas although the tissue here looks good. The  area on the left lateral foot once probe to bone it certainly does not do that now 04/18/18; MRI of the right foot  is on Thursday. He has what looks to be serosanguineous purulent drainage coming out of the wound over the right third metatarsal head today. He completed antibiotics bank and Fortaz 2 weeks ago at dialysis. He has a vascular follow-up with regards to his arterial insufficiency although I don't exactly see when that is booked. The areas on the left foot including the lateral left foot and the plantar left fifth metatarsal head look about the same. 04/25/18-He is here in follow-up evaluation for bilateral foot ulcers. MRI obtained was negative for osteomyelitis. Wound culture was negative. We will continue with same treatment plan he'll follow-up next week 05/02/18; the right plantar foot wound over the third metatarsal head actually looks better than when I last saw this. His MRI was negative for osteomyelitis wound culture was negative. ooOn the left plantar foot both wounds on the plantar fifth metatarsal head and on the lateral foot both are covered in a very hard circumferential callus. 05/09/18; right plantar foot wound over the third metatarsal head stable from last week. ooLeft plantar foot wound over the fifth metatarsal head also stable but with callus around both wound areas ooThe area on the left lateral foot had thick callus over a surface and I had some thoughts about leaving this intact however it felt boggy. ooWe've been using silver alginate all wounds 05/16/18; since the patient was last here he was hospitalized from 5/15 through 5/19. 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 This grew MRSA. He was placed on vancomycin.MRI of the left foot suggested "progressive" osteomyelitis and bone destruction of the cuboid and the base of the fifth metatarsal. Stable erosive changes of the base of the second metatarsal. I'm wondering if they're aware that he had surgery and debridement  in the area of the underlying bone previously by Dr. [name]. In any case, He was also revascularized with an angioplasty and stenting of the left tibial peroneal trunk and angioplasty of the left posterior tibial artery. He is on vancomycin at dialysis. He has a 2 week follow-up with infectious disease. He has vascular surgery follow-up. He was started on Plavix. 05/23/18; the patient's wound on the lateral left foot at the level of the fifth metatarsal head and the plantar wound on the plantar fifth metatarsal head both look better. The area on the right third metatarsal head still has depth and undermining. We've been using silver alginate. He is on vancomycin. He has been revascularized on the left 05/30/18; he continues on vancomycin at dialysis. Revascularized on the left. The area on the left lateral foot is just about closed. Unfortunately the area over the plantar fifth metatarsal head undermining superiorly as does the area over the right third metatarsal head. Both of these significantly deteriorated from last week. He has appointments next week with infectious disease and orthopedics I delayed putting on a cast on the left until those appointments which therefore we made we'll bring him in on Friday the 14th with the idea of a cast on the left foot 06/06/18; he continues on vancomycin at dialysis. Revascularized on the left. He arrives today with a ballotable swelling just above the area on the left lateral foot where his previous wound was. He has no pain but he is  reasonably insensate. The difficult areas on the plantar left fifth metatarsal head looks stable whereas the area on the right third plantar metatarsal head about the same as last week there is undermining here although I did not unroofed this today. He required a considerable debridement of the swelling on the left lateral foot area and I unroofed an abscess with a copious amount of brown purulent material which I obtained for  culture. I don't believe during his recent hospitalization he had any further imaging although I need to review this. Previous cultures from this area done in this clinic showed MRSA, I would be surprised if this is not what this is currently even though he is on vancomycin. 06/13/18; he continues on vancomycin at dialysis however he finishes this on Sunday and then graduates the doxycycline previously prescribed by Dr. [name]. The abscess site on the lateral foot that I unroofed last time grew moderate amounts of methicillin-resistant staph aureus that is both vancomycin and tetracycline sensitive so we should be okay from that regard. He arrives today in clinic with a connection between the abscess site and the area on the lateral foot. We've been using silver alginate to all his wound areas. The right third metatarsal head wound is measuring smaller. Using silver alginate on both wound areas 06/20/18; transitioning to doxycycline prescribed by Dr. [name]. The abscess site on the lateral foot that I unroofed 2 weeks ago grew MRSA. That area has largely closed down although the lateral part of his wound on the fifth metatarsal head still probes to bone. I suspect all of this was connected. ooOn the right third metatarsal head smaller looking wound but surrounded by nonviable tissue that once again requires debridement using silver alginate on both areas 06/27/18; on doxycycline 100 twice a day. The abscess on the lateral foot has closed down. Although he still has the wound on the left fifth metatarsal head that extends towards the lateral part of the foot. The most lateral part of this wound probes to bone ooRight third metatarsal head still a deep probing wound with undermining. Nevertheless I elected not to debridement this this week ooIf everything is copious static next week on the left I'm going to attempt a total contact cast 07/04/18; he is tolerating his doxycycline. The abscess on the  left lateral foot is closed down although there is still a deep wound here that probes to bone. We will use silver collagen under the total contact cast Silver collagen to the deep wound over the right third metatarsal head is well 07/11/18; he is tolerating doxycycline. The left lateral fifth plantar metatarsal head still probes deeply but I could not probe any bone. We've been using silver collagen and this will be the second week under the total contact cast ooSilver collagen to deep wound over the right third metatarsal head as well 07/18/18; he is still taking doxycycline. The left lateral fifth plantar metatarsal this week probes deeply with a large amount of exposed bone. Quite a deterioration. He required extensive debridement. Specimens of bone for pathology and CNS obtained ooAlso considerable debridement on the right third metatarsal head 07/25/18; bone for pathology last week from the lateral left foot showed osteomyelitis. CandS of the bone Enterobacter. And Klebsiella. He is now on ciprofloxacin and the doxycycline is stopped [Dr. [name] of infectious disease] area He has an appointment with Dr. [name] tomorrow I'm going to leave the total contact cast off for this week. May wish to try to reapply that  next week or the week after depending on the wound bed looks. I'm not sure if there is an operative option here, previously is followed with Dr. [name] 08/01/18 on evaluation today patient presents for reevaluation. He has been seen by infectious disease, Dr. [name], and he has placed the patient back on Ceftaz currently to be given to him at dialysis three times a week. 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 Fortunately there does not appear to be the evidence of infection at this point in time. Overall the patient has been tolerating the dressing changes without complication. We are no longer due lives in the  cast of left foot secondary to the infection obviously. 08/10/18 on evaluation today patient appears to be doing rather well all things considered in regard to his left plantar foot ulcer. He does have a significant callous on the lateral portion of his left foot he wonders if I can help clean that away to some degree today. Fortunately he is having no evidence of infection at this time which is good news. No fevers chills noted. With that being said his right plantar foot actually does have a significant callous buildup around the wound opening this seems to not be doing as well as I would like at this point. I do believe that he would benefit from sharp debridement at this site. Subsequently I think a total contact cast would be helpful for the right foot as well. 08/17/18; the patient arrived today with a total contact cast on the right foot. This actually looks quite good. He had gone to see Dr. Megan Salon of infectious disease about the osteomyelitis on the left foot. I think he is on IV Fortaz at dialysis although I am not exactly sure of the rationale for the Fortaz at the time of this dictation. He also arrived in today with a swelling on the lateral left foot this is the site of his original wounds in fact when I first saw this man when he was under Dr. Ardeen Garland care I think this was the site of where the wound was located. It was not particularly tender however using a small scalpel I opened it to [place] some moderate amount of purulent drainage. 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 Last MRI was in May that showed progressive osteomyelitis and bone destruction of the cuboid and base of the fifth metatarsal. Last week he had an abscess over this same area this was removed. Culture was negative. ooWe are continuing with a total contact cast to the area on the  third metatarsal head on the right and making some good progress here. The patient asked me again about renal transplant. He is not on the list because of the open wounds. 08/31/18; apparently the patient had an interruption in the [place] but he is now back on this at dialysis. Apparently there was a misunderstanding and Dr. Crissie Figures orders. Due to have the MRI of the left foot tonight. ooThe area on the right foot continues to have callus thick skin and subcutaneous tissue around the wound edge that requires constant debridement however the wound is smaller we are using a total contact cast in this area. Alginate all wounds 09/07/18; without much surprise the MRI of his left foot showed osteomyelitis in the reminiscent of  his fourth metatarsal but also the third metatarsal. She arrives in clinic today with the right foot and a total contact cast. There was purulent drainage coming out of the wound which I have cultured. Marked deterioration here with undermining widely around the wound orifice. Using pickups and a scalpel I remove callus and subcutaneous tissue from a substantial new opening. ooIn a similar fashion the area on the left lateral foot that was blistered last week I have opened this and remove skin and subcutaneous tissue from this area to expose a obvious new wound. I think there is extension and communication between all of this on the left foot. ooThe patient is on [place] at dialysis. Culture done of the right foot. We are clearly not making progress here. ooWe have made him an appointment with Dr. [name] of orthopedics. I think an amputation of the left leg may be discussed. I don't think there is anything that can be done with foot salvage. 09/14/18; culture from the right foot last time showed a very resistant MRSA. This is resistant to doxycycline. I'm going to try to get linezolid at least for a week. He does not have a appointment with Dr. [name] yet. 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 I'll send a message to Dr. [name] of infectious disease. I want the patient to see Dr. [name] to go over the pros and cons of an amputation which I think will be a BKA. The patient had a question about whether this is curative or not. I told him that I thought it would be although spread of staph aureus infection is not unheard of. MRI of the right foot was done at the end of April 2019 did not show osteomyelitis. The patient last saw Dr. [name] on 9/3. I'll send Dr. [name] a message. I would really like him to weigh the pros and cons of a BKA on the left otherwise he'll probably need IV antibiotics and perhaps hyperbaric oxygen again. He has not had a good response to this in the past. His MRI earlier this month showed progressive damage in the remnants of the fourth and third metatarsal 09/21/2018; sees Dr. [name] of orthopedics next week to discuss a left BKA in response to the osteomyelitis in the left foot. Using silver alginate to all wound areas. He is completing the Zyvox I did put in for him after last culture showed MRSA 09/29/2018; patient saw Dr. [name] of orthopedics discussed the osteomyelitis in the left foot third and fourth metatarsals. He did not recommend urgent surgery but certainly stated the only surgical option would be a BKA. He is communicating with Dr. [name]. The problem here is the instability of the areas on his foot which constantly generate draining abscesses. I will communicate with Dr. [name] about this. He has an appointment on 10/23 10/05/2018; patient sees Dr. [name] on 10/23. I have sent him a secure message not ordered any additional antibiotics for now. Patient continues to have 2 open areas on the left plantar foot at the fifth metatarsal head and the lateral aspect of the foot. These have not changed all that much. The area on the right third met head still has thick  callus and surrounding subcutaneous tissue we have been using silver alginate 10/12/2018; patient sees Dr. [name] or colleague on 10/23. 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 The  area on the right plantar foot is just about closed. The superior wound on the left fifth metatarsal head is callused over but I am not sure if this is closed. The area below it is about the same. Considerable amount of callus on the lateral foot. We have been using silver alginate to the wounds 10/19/2018; the patient saw Dr. [name] on 10/22. He wishes to try and continue to save the left foot. He has been given 6 weeks of oral ciprofloxacin. We continue to put a total contact cast on the right foot. The area over the fifth metatarsal head on the left is callused over/perhaps healed but I did not remove the callus to find out. He still has the wound on the left lateral midfoot requiring debridement. We have been using silver alginate to all wound areas 10/26/2018 ooOn the left foot our intake nurse noted some purulent drainage from the inferior wound [currently the only one that is not callused over] ooOn the right foot even though he is in total contact cast a considerable amount of thick black callus and surface eschar. On this side we have been using silver alginate under a total contact cast. oohe remains on ciprofloxacin as prescribed by Dr. [name] 11/02/2018; culture from last week which is done of the probing area on the left midfoot wound showed MRSA "few". I am going to need to contact Dr. [name] which I will do today I think he is going to need IV antibiotics again. The area on the left foot which was callused on the side is clearly separating today all of this was removed a copious amounts of callus and necrotic subcutaneous tissue. ooThe area on the right plantar foot actually remained healthy looking with a healthy granulated  base. ooWe are using silver alginate to all wound areas 11/09/2018; unfortunately neither 1 of the patient's wounds areas looks at all satisfactory. On the left he has considerable necrotic debris from the plantar wound laterally over the foot. I removed copious amounts of material including callus skin and subcutaneous tissue. On the right foot he arrives with undermining and frankly purulent drainage. Specimen obtained for culture and debridement of the callused skin and subcutaneous tissue from around the circumference. Because of this I cannot put him back in a total contact cast but to be truthful we are really unfortunately not making a lot of progress I did put in a secure text message to Dr. [name] wondering about the MRSA on the left foot. He suggested vancomycin although this is not started. I was left wondering if he expected me to send this into dialysis. Has we have more purulence on the right side wait for that result before calling dialysis 11/16/2018; I called dialysis earlier this week to get the vancomycin started. I think he got the first dose on Tuesday. The patient tells me that he fell earlier this week twisting his left foot and ankle and he is swelling. He continues to not look well. He had blood cultures done at dialysis on Tuesday he is not been informed of the results. 12/07/2018; the patient was admitted to hospital from 11/22/2018 through 12/02/2018. He underwent a left BKA. Apparently had staph sepsis. In the meantime being off the right foot this is closed over. He follows up with Dr. 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 Subsequent to this he actually had multiple falls in fact  he tells me to following which calls the wound to be his apparently. He has been seeing Dr. [name] and his physician assistant in the interim. They actually did want him  to come back for reevaluation to see if there's anything we can do for me wound care perspective the help this area to heal more appropriately. The patient states that he does not have a tremendous amount of pain which is good news. No fevers, chills, nausea, or vomiting noted at this time. We have gotten approval from Dr. [name] office had [place] for [place] to treat the patient for his stop wound. This is due to the fact that the patient was in the 90 day postop global. 1/21; the patient does not have an open wound on the right foot although he does have some pressure areas that will need to be padded when he is transferring. The dehisced surgical wound looks clean although there is some undermining of 1.5 cm superiorly. 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 This appears healthy but there is undermining. TheraSkin #1 2/11; TheraSkin #2 2/25; TheraSkin #3. Wound is measuring smaller 3/10; TheraSkin #4 wound is measuring smaller 3/24; TheraSkin #5 wound is measuring smaller and looks healthy. 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 He has no open area on the right foot although he does have some callused area over the original third metatarsal head wound. The third metatarsal is also subluxed on the right foot. I have warned him today that he cannot consider wearing a prosthesis for at least a month but he can go for measurements. He is going to need to keep the right foot padded is much as possible in his diabetic shoe indefinitely READMISSION 06/12/2019 Mr. Vandam is a type II diabetic on dialysis. He has been in this clinic multiple times with wounds on his bilateral lower extremities. 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 He also has a history of wounds on his right  foot but tells me that everything is going well here. He actually came in here with his prosthesis walking with a cane. We were all really quite gratified to see this He states over the last several weeks he has had a painful area at the tip of the left third finger. His dialysis shunt in his is in the left upper arm and this particularly hurts during dialysis when his fingers get numb and there is a lot of pain in the tip of his left third finger. I believe he is seen vascular surgery. He had a Doppler done to evaluate for a dialysis steal syndrome. He is going for a banding procedure 1 week from today towards the left AV fistula. I think if that is unsuccessful they will be looking at creating a new shunt. He had a course of doxycycline when he which he finished about a week ago. He has been using Bactroban to the finger 6/25; the patient had his banding procedure and things seem to be going better. He is not having pain in his hand at dialysis. The area on the tip of his finger seems about closed. He did however have a paronychia I the last time I saw him. I have been advising him to use topical Bactroban washing the  finger. Culture I did last time showed a few staph epidermidis which I was willing to dismiss as superficial skin contaminant. He arrives today with the finger wound looking better however the paronychia a seems worse 7/9; 2-week follow-up. He does not have an open wound on the tip of his third left finger. I thought he had an initial ischemic wound on the tip of the finger as well as a paronychia a medially. All of this appears to be closed. 8/6-Patient returns after 3 weeks for follow-up and has a right second met head plantar callus with a significant area of maceration hiding an ulcer ABI repeated today on right - 1.26 8/13-Patient returns at 1 week, the right second met head plantar wound appears to be slightly better, it is surrounded by the callus area we are using silver  alginate 8/20; the patient came back to clinic 2 weeks ago with a wound on the right second metatarsal head. He apparently told me he developed callus in this area but also went away from his custom shoes to a pair of running shoes. Using silver alginate. 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 There might be much we can do to offload this although the patient tells me he has a wheelchair that he is using to try and stay off the wound is much as possible 8/27; right second metatarsal head. Still small punched out area with thick callus and subcutaneous tissue around the wound. We have been using silver collagen. This is always been an issue with this man's wound especially on this foot 9/3; right second met head. Still the same punched-out area with thick callus and subcutaneous tissue around the wound removing the circumference demonstrates repetitively undermining area. I have removed all the subcutaneous tissue associated with this. We have been using silver collagen 9/15; right second metatarsal head. Same punched-out thick callus and subcutaneous tissue around the wound area. Still requiring debridement we have been using silver collagen I changed back to silver alginate X-ray last time showed no evidence of osteomyelitis. Culture showed Klebsiella and he was started and Keflex apparently just 4 days ago 9/24; right second metatarsal head. He is completed the antibiotics I gave him for 10 days. Same punched-out thick callus and subcutaneous tissue around the area. Still requiring debridement. We have been using silver alginate. The area itself looks swollen and this could be just [place] that comes on this area with walking on a prosthesis on the left however I think he needs an MRI and I am going to order that today. There is not an option to offload this further 10/1; right second plantar metatarsal head. MRI booked for October 6. Generally looking better today  using silver alginate 10/8; right second plantar metatarsal head. MRI that was done on 10/6 was negative for osteomyelitis noted prior amputations of the second and fourth toes at the [facility]. Prior amputation of the third toe to the base of the middle phalanx. Prior amputation of the fifth toe to the head of the metatarsal. 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 He does not recall about hearing about this. He did not have any pain but then again he has reduced sensation from diabetic neuropathy 10/15; right second plantar metatarsal head. Still in the same condition callus nonviable subcutaneous tissue which cleans up nicely with debridement but reforms by the next week. The patient tells me that he is offloading  this is much as he can including taking his wheelchair to dialysis. We have been using silver alginate, changed to silver collagen today 10/22; right second plantar metatarsal head. Wound measures smaller but still thick callus around this wound. Still requiring debridement 11/5 right second plantar metatarsal head. Less callus around the wound circumference but still requiring debridement. There is still some undermining which I cleaned out the wound looks healthy but still considerable punched out depth with a relatively small circumference to the wound there is no palpable bone no purulent drainage Objective Constitutional Sitting or standing Blood Pressure is within target range for patient.. Pulse regular and within target range for patient.Marland Kitchen Respirations regular, non-labored and within target range.. Temperature is normal and within the target range for the patient.Marland Kitchen Appears in no distress. Vitals Time Taken: 1:25 PM, Height: 73 in, Weight: 202 lbs, BMI: 26.6, Temperature: 98.1 F, Pulse: 85 bpm, Respiratory Rate: 18 breaths/min, Blood Pressure: 113/26 mmHg, Capillary Blood Glucose: 129 mg/dl. General Notes: patient reported  last CBG was 129 General Notes: Wound exam; debridement of callus is some subcutaneous tissue. I am able to get down to a clean base of this although is depth. There is no purulence no surrounding erythema. Used a #3 curette Integumentary (Hair, Skin) Wound #41 status is Open. Original cause of wound was Gradually Appeared. The wound is located on the Right Metatarsal head second. The wound measures 0.2cm length x 0.4cm width x 0.6cm depth; 0.063cm^2 area and 0.038cm^3 volume. There is Fat Layer (Subcutaneous Tissue) Exposed exposed. There is no tunneling or undermining noted. There is a small amount of serous drainage noted. The wound margin is thickened. There is large (67-100%) pale granulation within the wound bed. There is a small (1-33%) amount of necrotic tissue within the wound bed including Adherent Slough. 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 There was a Excisional Skin/Subcutaneous Tissue Debridement with a total area of 0.25 sq cm performed by Ricard Dillon., MD. With the following instrument(s): Curette to remove Viable and Non-Viable tissue/material. Material removed includes Callus and Subcutaneous Tissue and after achieving pain control using Other (benzocaine, 20%). No specimens were taken. A time out was conducted at 13:44, prior to the start of the procedure. A Minimum amount of bleeding was controlled with Pressure. The procedure was tolerated well with a pain level of 0 throughout and a pain level of 0 following the procedure. Post Debridement Measurements: 0.2cm length x  0.4cm width x 0.6cm depth; 0.038cm^3 volume. Character of Wound/Ulcer Post Debridement is improved. Severity of Tissue Post Debridement is: Fat layer exposed. Post procedure Diagnosis Wound #41: Same as Pre-Procedure Plan Follow-up Appointments: Return Appointment in 1 week. Dressing Change Frequency: Wound #41 Right Metatarsal head second: Change Dressing every other day. Skin Barriers/Peri-Wound Care: Barrier cream - as needed for maceration, wetness, or redness. Wound Cleansing: May shower and wash wound with soap and water. Primary Wound Dressing: Wound #41 Right Metatarsal head second: Silver Collagen - moisten with hydrogel ([place] jelly) or saline Secondary Dressing: Wound #41 Right Metatarsal head second: Kerlix/Rolled Gauze Dry Gauze Other: - felt to periwound and place one below wound. lightly wrap coban to  hold dressing in place. felt / pad wound Edema Control: Avoid standing for long periods of time Elevate legs to the level of the heart or above for 30 minutes daily and/or when sitting, a frequency of: - throughout the day. Off-Loading: Other: - felt to periwound and place one below wound. Additional Orders / Instructions: Other: - minimize walking and standing on foot to aid in wound healing and callus buildup. 1. Continue with silver collagen in general post debridement a better looking surface today although still some depth. 2. I still think this is an offloading issue. Might consider a skin subhowever if this does not fill in Electronic Signature(s) Signed: 11/01/2019 5:45:12 PM By: Linton Ham MD Entered By: Linton Ham on 11/01/2019 14:02:12 -------------------------------------------------------------------------------- SuperBill Details Patient Name: Date of Service: GAROLD, SHEELER 11/01/2019 Medical Record [id number] Patient Account Number: [id number] Date of Birth/Sex: Treating RN: 1951/04/07 (68 y.o. [name] Primary Care  Provider: [name] Other Clinician: Referring Provider: Treating Provider/Extender:Kian Ottaviano, Jeronimo Greaves, Buena Irish in Treatment: 20 Diagnosis Coding ICD-10 Codes Code Description E11.621 Type 2 diabetes mellitus with foot ulcer L97.512 Non-pressure chronic ulcer of other part of right foot with fat layer exposed I70.298 Other atherosclerosis of native arteries of extremities, other extremity Facility Procedures The patient participates with Medicare or their insurance follows the Medicare Facility Guidelines: CPT4 Code Description Modifier Quantity [id number] 11042 - DEB SUBQ TISSUE 20 SQ CM/< 1 ICD-10 Diagnosis Description E11.621 Type 2 diabetes mellitus with  foot ulcer Physician Procedures CPT4 Code: [id number] Description: 14840 - WC PHYS SUBQ TISS 20 SQ CM ICD-10 Diagnosis Description E11.621 Type 2 diabetes mellitus with foot ulcer Modifier: Quantity: 1 Electronic Signature(s) Signed: 11/01/2019 5:45:12 PM By: Linton Ham MD Entered By: Linton Ham on 11/01/2019 14:02:26
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
## 2019-11-02 DIAGNOSIS — Z992 Dependence on renal dialysis: Secondary | ICD-10-CM | POA: Diagnosis not present

## 2019-11-02 DIAGNOSIS — N2581 Secondary hyperparathyroidism of renal origin: Secondary | ICD-10-CM | POA: Diagnosis not present

## 2019-11-02 DIAGNOSIS — N186 End stage renal disease: Secondary | ICD-10-CM | POA: Diagnosis not present

## 2019-11-02 DIAGNOSIS — E1121 Type 2 diabetes mellitus with diabetic nephropathy: Secondary | ICD-10-CM | POA: Diagnosis not present

## 2019-11-05 DIAGNOSIS — E1121 Type 2 diabetes mellitus with diabetic nephropathy: Secondary | ICD-10-CM | POA: Diagnosis not present

## 2019-11-05 DIAGNOSIS — N186 End stage renal disease: Secondary | ICD-10-CM | POA: Diagnosis not present

## 2019-11-05 DIAGNOSIS — N2581 Secondary hyperparathyroidism of renal origin: Secondary | ICD-10-CM | POA: Diagnosis not present

## 2019-11-05 DIAGNOSIS — Z992 Dependence on renal dialysis: Secondary | ICD-10-CM | POA: Diagnosis not present

## 2019-11-07 DIAGNOSIS — N186 End stage renal disease: Secondary | ICD-10-CM | POA: Diagnosis not present

## 2019-11-07 DIAGNOSIS — E1121 Type 2 diabetes mellitus with diabetic nephropathy: Secondary | ICD-10-CM | POA: Diagnosis not present

## 2019-11-07 DIAGNOSIS — Z992 Dependence on renal dialysis: Secondary | ICD-10-CM | POA: Diagnosis not present

## 2019-11-07 DIAGNOSIS — N2581 Secondary hyperparathyroidism of renal origin: Secondary | ICD-10-CM | POA: Diagnosis not present

## 2019-11-08 ENCOUNTER — Other Ambulatory Visit: Payer: Self-pay

## 2019-11-08 ENCOUNTER — Encounter (HOSPITAL_BASED_OUTPATIENT_CLINIC_OR_DEPARTMENT_OTHER): Payer: Medicare Other | Admitting: Internal Medicine

## 2019-11-08 DIAGNOSIS — L03115 Cellulitis of right lower limb: Secondary | ICD-10-CM | POA: Diagnosis not present

## 2019-11-08 DIAGNOSIS — L97512 Non-pressure chronic ulcer of other part of right foot with fat layer exposed: Secondary | ICD-10-CM | POA: Diagnosis not present

## 2019-11-08 DIAGNOSIS — Z89512 Acquired absence of left leg below knee: Secondary | ICD-10-CM | POA: Diagnosis not present

## 2019-11-08 DIAGNOSIS — I70298 Other atherosclerosis of native arteries of extremities, other extremity: Secondary | ICD-10-CM | POA: Diagnosis not present

## 2019-11-08 DIAGNOSIS — E1122 Type 2 diabetes mellitus with diabetic chronic kidney disease: Secondary | ICD-10-CM | POA: Diagnosis not present

## 2019-11-08 DIAGNOSIS — E11621 Type 2 diabetes mellitus with foot ulcer: Secondary | ICD-10-CM | POA: Diagnosis not present

## 2019-11-08 NOTE — Progress Notes (Signed)
[name], [name] ([id number]) Visit Report for 11/08/2019 HPI Details Patient Name: Date of Service: [name], [name] 11/08/2019 12:30 PM Medical Record [id number] Patient Account Number: [id number] Date of Birth/Sex: [birth year] (68 y.o. M) Treating RN: Deon Pilling Primary Care Provider: [name] Other Clinician: Referring Provider: Treating Provider/Extender:Beverlyann Broxterman, Jeronimo Greaves, Buena Irish in Treatment: 21 History of Present Illness Location: Patient presents with a wound to bilateral feet. Quality: Patient reports experiencing essentially no pain. Severity: Mildly severe wound with no evidence of infection Duration: Patient has had the wound for greater than 2 weeks prior to presenting for treatment HPI Description: The patient is a pleasant 68 yrs old bm here for evaluation of ulcers on the plantar aspect of both feet. 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 He has a left arm graft for access. He has been trying to stay off his feet for weeks but does not seem to have any improvement in the wound. He has been seeing someone at the foot center and was referred to the Wound Care center for further evaluation. 12/30/15 the patient has 3 wounds one over the right first metatarsal head and 2 on the left foot at the left fifth and lleft first metatarsal head. All of these look relatively similar. The one over the left fifth probe to bone I could not prove that any of the others did. I note his MRI in November that did not show osteomyelitis. His peripheral pulses seem robust. All of these underwent surgical debridement to remove callus nonviable skin and subcutaneous tissue 01/06/16; the patient had his sutures removed from the right fifth ray amputation. There may be a small open part of this superiorly but otherwise the incision looks good. Areas over his right first, left first and left fifth metatarsal head all  underwent surgical debridement as a varying degree of callus, skin and nonviable subcutaneous tissue. The area that is most worrisome is the right fifth metatarsal head which has a wound probes precariously close to bone. There is no purulent drainage or erythema 01/13/16; I'm not exactly sure of the status of the right fifth ray amputation site however he follows with Dr. [name] later this week. The area over his right first plantar metatarsal head, left first and fifth plantar metatarsal head are all in the same status. Thick circumferential callus, nonviable subcutaneous tissue. Culture of the left fifth did not culture last week 01/20/16; all of the patient's wounds appear and roughly the same state although his amputation site on the right lateral foot looks better. His wounds over the right first, left first and left fifth metatarsal heads all underwent difficult surgical debridement removing circumferential callus nonviable skin and subcutaneous tissue. There is no overt evidence of infection in these areas. MRI at the end of December of the left foot did not show osteomyelitis, right foot showed osteomyelitis of the right fifth digit he is now status post amputation. 01/27/16 the patient's wounds over his plantar first and fifth metatarsal heads on the left all appear better having been started on a total contact cast last week. They were debridement of circumferential callus and nonviable subcutaneous tissue as was the wound over the first metatarsal head. His surgical incision on the right also had some light surface debridement done. 03/23/2016 -- the patient was doing really well and most of his wounds had almost completely healed but now he came back today with a history of having a discharge from  the area of his right foot on the plantar aspect and also between his first and second toe. He also has had some discharge from the left foot. Addendum: I spoke to the PA [name] at  the dialysis center whose fax number is [phone]. We discussed the infection the patient has and she will put the patient on vancomycin and Fortaz until the final culture report is back. We will fax this as soon as available. 03/30/2016 -- left foot x-ray IMPRESSION:No definitive osteomyelitis noted. X-ray of the right foot -- IMPRESSION: 1. Soft tissue swelling. Prior amputation right fifth digit. No acute or focal bony abnormality identified. If osteomyelitis remains a clinical concern, MRI can be obtained. 2. Peripheral vascular disease. His culture reports have grown an MSSA -- and we will fax this report to his hemodialysis center. He was called in a prescription of oral doxycycline but I have told her not to fill this in as he is already on IV antibiotics. 04/06/2016 -- a few days ago, I spoke to the hemodialysis center nurse who had stopped the IV antibiotics and he was given a prescription for doxycycline 100 mg by mouth twice a day for a week and he is on this at the present time. 05/11/2016 -- he has recently seen his PCP this week and his hemoglobin A1c was 7. He is working on his paperwork to get his orthotic shoes. 05/25/2016 -- -- x-ray of the left foot IMPRESSION:No acute bony abnormality. No radiographic changes of acute osteomyelitis. No change since prior study. X-ray of the right foot -- IMPRESSION: Postsurgical changes are seen involving the fifth toe. No evidence of acute osteomyelitis. he developed a large blister on the medial part of his right foot and this opened out and drain fluid. 06/01/2016 -- he has his MRI to be done this afternoon. 06/15/2016 -- MRI of the left forefoot without contrast shows 2 separate regions of cutaneous and subcutaneous edema and possible ulceration and blistering along the ball of the foot. No obvious osteomyelitis identified. MRI of the right foot showed cutaneous and subcutaneous thickening plantar to the first digit sesamoid with  an ulcer crater but no underlying osteomyelitis is identified. 06/22/2016 -- the right foot plantar ulcer has been draining a lot of seropurulent material for the last few days. 06/30/2016 -- spoke to the dialysis center and I believe I spoke to [place] a PA at the center who discussed with me and agreed to putting Legrand Como on vancomycin until his cultures arrive. On review of his culture report no WBCs were seen or no organisms were seen and the culture was reincubated for better growth. The final report is back and there were no predominant growth including Streptococcus or Staphylococcus. Clinically though he has a lot of drainage from both wounds a lot of undermining and there is further blebs on the left foot towards the interspace between his first and second toe. 08/10/2016 -- the culture from the right foot showed normal skin flora and there was no Staphylococcus aureus was group A streptococcus isolated. 09/21/2016 -- -- MRI of the right foot was done on 09/13/2016 - IMPRESSION: Findings most consistent with acute osteomyelitis throughout the great toe, sesamoid bones and plantar aspect of the head of the first metatarsal. Fluid in the sheath of the flexor tendon of the great toe could be sympathetic but is worrisome for septic tenosynovitis. First MTP joint effusion worrisome for septic joint. He was admitted to the hospital on 09/11/2016 and treated for  a fever with vancomycin and cefepime. He was seen by Dr. Bobby Rumpf of infectious disease who recommended 6 weeks treatment with vancomycin and ceftazidime with his hemodialysis and to continue to see as in the wound clinic. Vascular consult was pending. The patient was discharged home on 09/14/2016 and he would continue with IV antibiotics for 6 weeks. 09/28/16 wound appears reasonably healthy. Continuing with total contact cast 10/05/16 wound is smaller and looking healthy. Continue with total contact cast. He continues on IV  antibiotics 10/12/2016 -- he has developed a new wound on the dorsal aspect of his left big toe and this is a superficial injury with no surrounding cellulitis. He has completed 30 days of IV antibiotics and is now ready to start his hyperbaric oxygen therapy as per his insurance company's recommendation. 10/19/2016 -- after the cast was removed on the right side he has got good resolution of his ulceration on the right plantar foot. he has a new wound on the left plantar foot in the region of his fourth metatarsal and this will need sharp debridement. 10/26/2016 -- Xray of the right foot complete: IMPRESSION: Changes consistent with osteomyelitis involving the head of the first metatarsal and base of the first proximal phalanx. The sesamoid bones are also likely involved given their positioning. 11/02/2016 -- there still awaiting insurance clearance for his hyperbaric oxygen therapy and hopefully he will begin treatment soon. 11/09/2016 -- started with hyperbaric oxygen therapy and had some barotrauma to the right ear and this was seen by ENT who was prescribed Afrin drops and would probably continue with HBO and he is scheduled for myringotomy tubes on Friday 11/16/2016 -- he had his myringotomy tubes placed on Friday and has been doing much better after that with some fluid draining out after hyperbaric treatment today. The pain was much better. 11/30/2016 -- over the last 2 days he noticed a swelling and change of color of his right second toe and this had been draining minimal fluid. 12/07/2016 -- x-ray of the right foot -- IMPRESSION:1. Progressive ulceration at the distal aspect of the second digit with significant soft tissue swelling and osseous changes in the distal phalanx compatible with osteomyelitis. 2. Chronic osteomyelitis at the first MTP joint. 3. Ulcerations at the second and third toes as well without definite osseous changes. Osteomyelitis is not excluded. 4. Fifth digit  amputation. On 12/01/2016 I spoke to Dr. Bobby Rumpf, the infectious disease specialist, who kindly agreed to treat this with IV antibiotics and he would call in the order to the dialysis center and this has been discussed in detail with the patient who will make the appropriate arrangements. The patient will also book an appointment as soon as possible to see Dr. [name] in the office. Of note the patient has not been on antibiotics this entire week as the dialysis center did not receive any orders from Dr. [name]. I got in touch with Dr. [name] who tells me the patient has an appointment to see him this coming Wednesday. 12/14/2016 -- the patient is on ceftazidime and vancomycin during his dialysis, and I understand this was put on by his nephrologist Dr. Raliegh Ip possibly after speaking with infectious disease Dr. [name] 12/23/16; patient was on my schedule today for a wound evaluation as he had difficulties with his schedule earlier this week. I note that he is on vancomycin and ceftazidine at dialysis. In spite of this he arrives today with a new wound on the base of the right second toe  this easily probes to bone. The known wound at the tip of the second toe With this area. He also has a superficial area on the medial aspect of the third toe on the side of the DIP. This does not appear to have much depth. The area on the plantar left foot is a deep area but did not probe the bone 12/28/16 -- he has brought in some lab work and the most recent labs done showed a hemoglobin of 12.1 hematocrit of 36.3, neutrophils of 51% WBC count of 5.6, BN of 53, albumin of 4.1 globulin of 3.7, vancomycin 13 g per mL. 01/04/2017 -- he saw Dr. [name] who has recommended a amputation of the right second toe and he is awaiting this date. He sees Dr. Bobby Rumpf of infectious disease tomorrow. The bone culture taken on 12/28/2016 had no growth in 2 days. 01/11/2017 -- was seen by Dr. Bobby Rumpf regarding  the management and has recommended a eval by vascular surgeons. He recommended to continue the antibiotics during his hemodialysis. Xray of the left foot -- IMPRESSION: No acute fracture, dislocation, or osseous erosion identified. 01/18/2017 -- he has his vascular workup later today and he is going to have his right foot second toe amputation this coming Friday by Dr. [name]. We have also put in for a another 30 treatments with hyperbaric oxygen therapy 01/25/2017 -- I have reviewed Dr. Donnetta Hutching is vascular report from last week where he reviewed him and thought his vascular function was good enough to heal his amputation site and no further tests were recommended. He also had his orthopedic related to surgery which is still pending the notes and we will review these next week. 02/01/2017 -- the operative remote of Dr. 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 02/08/2017 --he was seen by Dr. [name] on 02/03/2017 -- patient is on [place]. after a thorough review he had recommended to continue with antibiotics during hemodialysis. The patient was seen by Dr. [name], but I do not find any follow-up note on the electronic medical record. he has removed the dressing over the right foot to remove the sutures and asked him to see him back in a month's time 02/22/2017 -- patient has been febrile and has been having symptoms of the upper respiratory tract infection but has not been checked for the flu and it's been over 4 days now. He says he is feeling better today. Some drainage between his left first and second toe and this needed to be  looked at 03/08/2017 -- was seen by Dr. Bobby Rumpf on 03/07/2017 after review he will stop his antibiotics and see him back in a month to see if he is feeling well. 03/15/2017 -- he has completed his course of hyperbaric oxygen therapy and is doing fine with his health otherwise. 03/22/2017 -- his nutritionist at the dialysis center has recommended a protein supplement to help build his collagen and we will prescribe this for him when he has the details 05/03/2017 -- he recently noticed the area on the plantar aspect of his fifth metatarsal head which opened out and had minimal drainage. 05/10/2017 -- -- x-ray of the left foot --  IMPRESSION:1. No convincing conventional radiographic evidence of active osteomyelitis.2. Active soft tissue ulceration at the tip of the fifth digit, and at the lateral aspect of the foot adjacent the base of the fifth metatarsal. 3. Surgical changes of prior fourth toe amputation. 4. 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 5. Small vessel atherosclerotic vascular calcifications. 6. 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 ======= Readmission after 5 weeks: 06/15/2017 -- the patient returns after 5 weeks having had an MRI done on 05/17/2017 MRI of the left foot without contrast showed soft tissue ulcer overlying the base of the fifth metatarsal. 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 No drainable fluid collection to suggest an abscess. He was in hospital between 05/16/2017 and 05/25/2017 -- and on discharge was asked to follow-up with Dr. [name] and Dr. [name]. He was treated 2 weeks post discharge with vancomycin plus ceftriaxone with hemodialysis and oral metronidazole 500 mg 3 times a day. 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 last hemoglobin  A1c was 6.7% He had a postoperative MR of the foot on 05/23/2017 -- which showed no new areas of cortical bone loss, edema or soft tissue ulceration to suggest osteomyelitis. the patient has completed his course of IV antibiotics during dialysis and is to see the infectious disease doctor tomorrow. Dr. [name] had seen him after suture removal and asked him to keep the wound with a dry dressing 06/21/2017 -- he was seen by Dr. Bobby Rumpf of infectious disease on 06/16/2017 -- he stopped his Flagyl and will see him back in 6 weeks. He was asked to follow-up with Dr. [name] and with the wound clinic clinic. 07/05/17; bone biopsy from last time showed acute osteomyelitis. This is from the reminiscent in the left fifth metatarsal. Culture result is apparently still pending [holding for anaerobe}. From my understanding in this case this is been a progressive necrotic wound which is deteriorated markedly over the last 3 weeks since he returned here. He now has a large area of exposed bone which was biopsied and cultured last week. Dr. Graylon Good has put him on vancomycin and Fortaz during his hemodialysis and Flagyl orally. He is to see Dr. [name] next week 07/19/2017 -- the patient was reviewed by Dr. [name] of orthopedics who reviewed the case in detail and agreed with the plan to continue with IV antibiotics, aggressive wound care and hyperbaric oxygen therapy. He would see him back in 3 weeks' time 08/09/2017 -- saw Dr. Bobby Rumpf on 08/03/2017 -he was restarted on his antibiotics for 6 weeks which included vanco, ceftaz and Flagyl. he recommended continuing his antibiotics for 6 weeks and reevaluate his completion date. He would continue with wound care and hyperbaric oxygen therapy. 08/16/2017 -- I understand he will be completing his 6 weeks of antibiotics sometime later this week. 09/19/2017 -- he has been without his wound VAC for the last week due to lack of supplies. He has been  packing his wound with silver alginate. 10/04/2017 -- he has an appointment with Dr. [name] tomorrow and hence we will not apply the wound VAC after his dressing changes today. 10/11/2017 -- he was seen by Dr. Bobby Rumpf on 10/05/2017, noted that the patient was currently off antibiotics, and after thorough review he recommended he follow-up with the wound care as per plan and no  further antibiotics at this point. 11/29/17; patient is arrived for the wound on his left lateral foot.Marland Kitchen He is completed IV antibiotics and 2 rounds of hyperbaric oxygen for treatment of underlying osteomyelitis. He arrives today with a surface on most of the wound area however our intake nurse noted drainage from the superior aspect. This was brought to my attention. 12/13/2017 -- the right plantar foot had a large bleb and once the bleb was opened out a large callus and subcutaneous debris was removed and he has a plantar ulcer near the fourth metatarsal head 12/28/17 on evaluation today patient appears to be doing acutely worse in regard to his left foot. The wound which has been appearing to do better as now open up more deeply there is bone palpable at the base of the wound unfortunately. He tells me that this feels "like it did when he had osteomyelitis previously" he also noted that his second toe on the left foot appears to be doing worse and is swollen there does appear to be some fluid collected underneath. His right foot plantar ulcer appears to be doing somewhat better at this point and there really is no complication at the site currently. No fevers, chills, nausea, or vomiting noted at this time. Patient states that he normally has no pain at this site however. Today he is having significant pain. 01/03/18; culture done last week showed methicillin sensitive staph aureus and group B strep. He was on [place] however he arrives today with a fever of 101. He has a functional dialysis shunt in his left arm but  has had no pain here. No cough. He still makes some urine no dysuria he did have abdominal pain nausea and diarrhea over the weekend but is not had any diarrhea since yesterday. Otherwise he has no specific complaints 01/05/18; the patient returns today in follow-up for his presentation from 1/8. At that point he was febrile. I gave him some Levaquin adjusted for his dialysis status. He tells me the fever broke that night and it is not likely that this was actually a wound infection. He is gone on to have an MRI of the left foot; this showed interval development of an abnormal signal from the base of the third and fourth metatarsal and in the cuboid reminiscent consistent with osteomyelitis. There is no mention of the left second toe I think which was a concern when it was ordered. The patient is taking his Levaquin 01/10/18; the patient has completed his antibiotics today. The area on the left lateral foot is smaller but it still probes easily to bone. He has underlying osteomyelitis here and sees Dr. Megan Salon of infectious disease this week The area on the right third plantar metatarsal head still requires debridement not much change in dimensions He has a new wound on the medial tip of his right third toe again this probes to bone. He only noticed this 2 days ago 01/17/18; the patient saw Dr. [name] last week and he is back on Vanco and Fortaz at dialysis. 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 We are previously seeing him for a wound also on the base of the fourth med head on the right. X- ray of this area did not show osteomyelitis in relation to a new wound on the tip of his right third toe. Again this probes to bone. Finally his  second toe on the left which didn't really show anything on the MRI at least no report appears to have a separated cutaneous area which I think is  going to come right off the tip of his toe and leave exposed bone. Whether this is infectious or ischemic I am not clear Culture I did from the third toe last week which was his new wound was negative. Plain x-ray on the right foot did not show osteomyelitis in the toes 01/24/18; the patient is going went to see Dr. [name]. He is going to have a amputation of the left second and right third toe although paradoxically the left second toe looks better than last week. We have treating a deep probing wound on the left lateral foot and the area over the third metatarsal head on the right foot. 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 Also a debridement including bone of the left lateral foot and a closure which is still sutured. This is a bit surprising. Also apparent debridement of the base of the right third toe wound. 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 I'm not sure exactly where we are in the frame of things here. The wound on the left lateral foot is still sutured also the amputation sites of the left second and right third toe. He is been using Xeroform to the sutured areas silver alginate to the right foot 02/14/18; he continues on Rocephin at dialysis for osteomyelitis. I still don't have a good sense of where we are in the treatment duration. The wounds on the left lateral foot had the sutures removed and this clearly still probes deeply. We debrided the area with a #5 curet. We'll use silver alginate to the wound the area over the right third met head also required debridement of callus skin and subcutaneous tissue and necrotic debris over the wound surface. This tunnels superiorly but I did not unroofed this today. The areas on the tip of his toes have a  crusted surface eschar I did not debridement this either. 02/21/18; he continues on Rocephin at dialysis for osteomyelitis. I don't have a good sense of where we are and the treatment duration. The wounds on the left lateral foot is callused over on presentation but requires debridement. The area on the right third med head plantar aspect actually is measuring smaller 02/28/18;patient is on Vanco and Fortaz at dialysis, I'm not sure why I thought this was Rocephin above unless it is been changed. I don't have a good sense of time frame here. Using silver collagen to the area on the left lateral foot and right third med head plantar foot 03/07/18; patient is completing bank and Fortaz at dialysis soon. He is using Silver collagen to the area on the left lateral foot and the right third metatarsal head. He has a smaller superficial wound distal to the left lateral foot wound. 03/14/18-he is here in follow-up evaluation for multiple ulcerations to his bilateral feet. He presents with a new ulceration to the plantar aspect of the left foot underneath the blister, this was deroofed to reveal a partial thickness ulcer. He is voicing no complaints or concerns, tolerated dialysis yesterday. We will continue with same treatment plan and he will follow-up next week 03/21/18;  the patient has a area on the lateral left foot which still has a small probing area. The overall surface area of the wound is better. He presented with a new ulceration on the left foot plantar fifth metatarsal head last week. The area on the right third metatarsal head appears smaller.using silver alginate to all wound areas. The patient is changing the dressing himself. He is using a [place] forefoot offloading are on the right and a healing sandal on the left 03/28/18; original wound left lateral foot. Still nowhere close to looking like a heeling surface secondary wound on the left lateral foot at the base of his question fifth metatarsal  which was new last week Right third metatarsal head. We have been using silver alginate all wounds 04/04/18; the original wound on the left lateral foot again heavy callus surrounding thick nonviable subcutaneous tissue all requiring debridement. The new wound from 2 weeks ago just near this at the base of the fifth metatarsal head still looks about the same Unfortunately there is been deterioration in the third medical head wound on the right which now probes to bone. I must say this was a superficial wound at one point in time and I really don't have a good frame of reference year. I'll have to go back to look through records about what we know about the right foot. 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 He is also been to see Dr. [name]. Somewhere in here as somebody is ordered a VASCULAR evaluation which was done on 03/22/18. On the right is anterior tibial artery was monophasic triphasic at the posterior tibial artery. On the left anterior tibial artery monophasic posterior tibial artery monophasic. ABI in the right was 1.43 on the left 1.21. TBIs on the right at 0.41 and on the left at 0.32. A vascular consult was recommended and I think has been arranged. 04/11/18; [name] has not had an MRI of the right foot since 2017. Recent x-ray of the right foot done in January and February was negative. However he has had a major deterioration in the wound over the third met head. He is completed antibiotics last week at dialysis [place]. I think he is going to see vascular in follow-up. The areas on the left lateral foot and left plantar fifth metatarsal head both look satisfactory. I debrided both of these areas although the tissue here looks good. The area on the left lateral foot once probe to bone it certainly does not do that now 04/18/18; MRI of the right foot is on Thursday. He has  what looks to be serosanguineous purulent drainage coming out of the wound over the right third metatarsal head today. He completed antibiotics bank and Fortaz 2 weeks ago at dialysis. He has a vascular follow-up with regards to his arterial insufficiency although I don't exactly see when that is booked. The areas on the left foot including the lateral left foot and the plantar left fifth metatarsal head look about the same. 04/25/18-He is here in follow-up evaluation for bilateral foot ulcers. MRI obtained was negative for osteomyelitis. Wound culture was negative. We will continue with same treatment plan he'll follow-up next week 05/02/18; the right plantar foot wound over the third metatarsal head actually looks better than when I last saw this. His MRI was negative for osteomyelitis wound culture was negative. On the left plantar foot both wounds on  the plantar fifth metatarsal head and on the lateral foot both are covered in a very hard circumferential callus. 05/09/18; right plantar foot wound over the third metatarsal head stable from last week. Left plantar foot wound over the fifth metatarsal head also stable but with callus around both wound areas The area on the left lateral foot had thick callus over a surface and I had some thoughts about leaving this intact however it felt boggy. We've been using silver alginate all wounds 05/16/18; since the patient was last here he was hospitalized from 5/15 through 5/19. 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 This grew MRSA. He was placed on vancomycin.MRI of the left foot suggested "progressive" osteomyelitis and bone destruction of the cuboid and the base of the fifth metatarsal. Stable erosive changes of the base of the second metatarsal. I'm wondering if they're aware that he had surgery and debridement in the area of the underlying bone  previously by Dr. [name]. In any case, He was also revascularized with an angioplasty and stenting of the left tibial peroneal trunk and angioplasty of the left posterior tibial artery. He is on vancomycin at dialysis. He has a 2 week follow-up with infectious disease. He has vascular surgery follow-up. He was started on Plavix. 05/23/18; the patient's wound on the lateral left foot at the level of the fifth metatarsal head and the plantar wound on the plantar fifth metatarsal head both look better. The area on the right third metatarsal head still has depth and undermining. We've been using silver alginate. He is on vancomycin. He has been revascularized on the left 05/30/18; he continues on vancomycin at dialysis. Revascularized on the left. The area on the left lateral foot is just about closed. Unfortunately the area over the plantar fifth metatarsal head undermining superiorly as does the area over the right third metatarsal head. Both of these significantly deteriorated from last week. He has appointments next week with infectious disease and orthopedics I delayed putting on a cast on the left until those appointments which therefore we made we'll bring him in on Friday the 14th with the idea of a cast on the left foot 06/06/18; he continues on vancomycin at dialysis. Revascularized on the left. He arrives today with a ballotable swelling just above the area on the left lateral foot where his previous wound was. He has no pain but he is reasonably insensate. The difficult areas on the plantar left fifth metatarsal head looks stable whereas the area on the right third plantar metatarsal head about the same as last week there is undermining here although I did not unroofed this today. He required a considerable debridement of the swelling on the left lateral foot area and I unroofed an abscess with a copious amount of brown purulent material which I obtained for culture. I don't believe during his  recent hospitalization he had any further imaging although I need to review this. Previous cultures from this area done in this clinic showed MRSA, I would be surprised if this is not what this is currently even though he is on vancomycin. 06/13/18; he continues on vancomycin at dialysis however he finishes this on Sunday and then graduates the doxycycline previously prescribed by Dr. [name]. The abscess site on the lateral foot that I unroofed last time grew moderate amounts of methicillin-resistant staph aureus that is both vancomycin and tetracycline sensitive  so we should be okay from that regard. He arrives today in clinic with a connection between the abscess site and the area on the lateral foot. We've been using silver alginate to all his wound areas. The right third metatarsal head wound is measuring smaller. Using silver alginate on both wound areas 06/20/18; transitioning to doxycycline prescribed by Dr. [name]. The abscess site on the lateral foot that I unroofed 2 weeks ago grew MRSA. That area has largely closed down although the lateral part of his wound on the fifth metatarsal head still probes to bone. I suspect all of this was connected. On the right third metatarsal head smaller looking wound but surrounded by nonviable tissue that once again requires debridement using silver alginate on both areas 06/27/18; on doxycycline 100 twice a day. The abscess on the lateral foot has closed down. Although he still has the wound on the left fifth metatarsal head that extends towards the lateral part of the foot. The most lateral part of this wound probes to bone Right third metatarsal head still a deep probing wound with undermining. Nevertheless I elected not to debridement this this week If everything is copious static next week on the left I'm going to attempt a total contact cast 07/04/18; he is tolerating his doxycycline. The abscess on the left lateral foot is closed down although  there is still a deep wound here that probes to bone. We will use silver collagen under the total contact cast Silver collagen to the deep wound over the right third metatarsal head is well 07/11/18; he is tolerating doxycycline. The left lateral fifth plantar metatarsal head still probes deeply but I could not probe any bone. We've been using silver collagen and this will be the second week under the total contact cast Silver collagen to deep wound over the right third metatarsal head as well 07/18/18; he is still taking doxycycline. The left lateral fifth plantar metatarsal this week probes deeply with a large amount of exposed bone. Quite a deterioration. He required extensive debridement. Specimens of bone for pathology and CNS obtained Also considerable debridement on the right third metatarsal head 07/25/18; bone for pathology last week from the lateral left foot showed osteomyelitis. CandS of the bone Enterobacter. And Klebsiella. He is now on ciprofloxacin and the doxycycline is stopped [Dr. [name] of infectious disease] area He has an appointment with Dr. [name] tomorrow I'm going to leave the total contact cast off for this week. May wish to try to reapply that next week or the week after depending on the wound bed looks. I'm not sure if there is an operative option here, previously is followed with Dr. [name] 08/01/18 on evaluation today patient presents for reevaluation. He has been seen by infectious disease, Dr. [name], and he has placed the patient back on Ceftaz currently to be given to him at dialysis three times a week. 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 Fortunately there does not appear to be the evidence of infection at this point in time. Overall the patient has been tolerating the dressing changes without complication. We are no longer due lives in the cast of left foot secondary to the infection  obviously. 08/10/18 on evaluation today patient appears to be doing rather well all things considered in regard to his left plantar foot ulcer. He does have a significant callous on the lateral portion of his left foot he wonders if  I can help clean that away to some degree today. Fortunately he is having no evidence of infection at this time which is good news. No fevers chills noted. With that being said his right plantar foot actually does have a significant callous buildup around the wound opening this seems to not be doing as well as I would like at this point. I do believe that he would benefit from sharp debridement at this site. Subsequently I think a total contact cast would be helpful for the right foot as well. 08/17/18; the patient arrived today with a total contact cast on the right foot. This actually looks quite good. He had gone to see Dr. Megan Salon of infectious disease about the osteomyelitis on the left foot. I think he is on IV Fortaz at dialysis although I am not exactly sure of the rationale for the Fortaz at the time of this dictation. He also arrived in today with a swelling on the lateral left foot this is the site of his original wounds in fact when I first saw this man when he was under Dr. Ardeen Garland care I think this was the site of where the wound was located. It was not particularly tender however using a small scalpel I opened it to [place] some moderate amount of purulent drainage. 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 Last MRI was in May that showed progressive osteomyelitis and bone destruction of the cuboid and base of the fifth metatarsal. Last week he had an abscess over this same area this was removed. Culture was negative. We are continuing with a total contact cast to the area on the third metatarsal head on the right and making some  good progress here. The patient asked me again about renal transplant. He is not on the list because of the open wounds. 08/31/18; apparently the patient had an interruption in the [place] but he is now back on this at dialysis. Apparently there was a misunderstanding and Dr. Crissie Figures orders. Due to have the MRI of the left foot tonight. The area on the right foot continues to have callus thick skin and subcutaneous tissue around the wound edge that requires constant debridement however the wound is smaller we are using a total contact cast in this area. Alginate all wounds 09/07/18; without much surprise the MRI of his left foot showed osteomyelitis in the reminiscent of his fourth metatarsal but also the third metatarsal. She arrives in clinic today with the right foot and a total contact cast. There was purulent drainage coming out of the wound which I have cultured. Marked deterioration here with undermining widely around the wound orifice. Using pickups and a scalpel I remove callus and subcutaneous tissue from a substantial new opening. In a similar fashion the area on the left lateral foot that was blistered last week I have opened this and remove skin and subcutaneous tissue from this area to expose a obvious new wound. I think there is extension and communication between all of this on the left foot. The patient is on Fortaz at dialysis. Culture done of the right foot. We are clearly not making progress here. We have made him an appointment with Dr. [name] of orthopedics. I think an amputation of the left leg may be discussed. I don't think there is anything that can be done with foot salvage. 09/14/18; culture from the  right foot last time showed a very resistant MRSA. This is resistant to doxycycline. I'm going to try to get linezolid at least for a week. He does not have a appointment with Dr. [name] yet. 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 I'll send a message to Dr. [name] of infectious disease. I want the patient to see Dr. [name] to go over the pros and cons of an amputation which I think will be a BKA. The patient had a question about whether this is curative or not. I told him that I thought it would be although spread of staph aureus infection is not unheard of. MRI of the right foot was done at the end of April 2019 did not show osteomyelitis. The patient last saw Dr. [name] on 9/3. I'll send Dr. [name] a message. I would really like him to weigh the pros and cons of a BKA on the left otherwise he'll probably need IV antibiotics and perhaps hyperbaric oxygen again. He has not had a good response to this in the past. His MRI earlier this month showed progressive damage in the remnants of the fourth and third metatarsal 09/21/2018; sees Dr. [name] of orthopedics next week to discuss a left BKA in response to the osteomyelitis in the left foot. Using silver alginate to all wound areas. He is completing the Zyvox I did put in for him after last culture showed MRSA 09/29/2018; patient saw Dr. [name] of orthopedics discussed the osteomyelitis in the left foot third and fourth metatarsals. He did not recommend urgent surgery but certainly stated the only surgical option would be a BKA. He is communicating with Dr. [name]. The problem here is the instability of the areas on his foot which constantly generate draining abscesses. I will communicate with Dr. [name] about this. He has an appointment on 10/23 10/05/2018; patient sees Dr. [name] on 10/23. I have sent him a secure message not ordered any additional antibiotics for now. Patient continues to have 2 open areas on the left plantar foot at the fifth metatarsal head and the lateral aspect of the foot. These have not changed all that much. The area on the right third met head still has thick callus and surrounding subcutaneous tissue we have been using  silver alginate 10/12/2018; patient sees Dr. [name] or colleague on 10/23. 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 The area on the right plantar foot is just about closed. The superior wound on the left fifth metatarsal head is callused over but I am not sure if this is closed. The area below it is about the same. Considerable amount of callus on the lateral foot. We have been using silver alginate to the wounds 10/19/2018; the patient saw Dr. [name] on 10/22. He wishes to try and continue to save the left foot. He has been given 6 weeks of oral ciprofloxacin. We continue to put a total contact cast on the right foot. The area over the fifth metatarsal head on the left is callused over/perhaps healed but I did not remove the callus to find out. He still has the wound on the left lateral midfoot requiring debridement. We have been using silver alginate to all wound areas 10/26/2018 On the left foot our intake nurse noted some purulent drainage from the inferior wound [currently the only one that is not callused over] On the right  foot even though he is in total contact cast a considerable amount of thick black callus and surface eschar. On this side we have been using silver alginate under a total contact cast. he remains on ciprofloxacin as prescribed by Dr. [name] 11/02/2018; culture from last week which is done of the probing area on the left midfoot wound showed MRSA "few". I am going to need to contact Dr. [name] which I will do today I think he is going to need IV antibiotics again. The area on the left foot which was callused on the side is clearly separating today all of this was removed a copious amounts of callus and necrotic subcutaneous tissue. The area on the right plantar foot actually remained healthy looking with a healthy granulated base. We are using silver alginate to all wound areas 11/09/2018;  unfortunately neither 1 of the patient's wounds areas looks at all satisfactory. On the left he has considerable necrotic debris from the plantar wound laterally over the foot. I removed copious amounts of material including callus skin and subcutaneous tissue. On the right foot he arrives with undermining and frankly purulent drainage. Specimen obtained for culture and debridement of the callused skin and subcutaneous tissue from around the circumference. Because of this I cannot put him back in a total contact cast but to be truthful we are really unfortunately not making a lot of progress I did put in a secure text message to Dr. [name] wondering about the MRSA on the left foot. He suggested vancomycin although this is not started. I was left wondering if he expected me to send this into dialysis. Has we have more purulence on the right side wait for that result before calling dialysis 11/16/2018; I called dialysis earlier this week to get the vancomycin started. I think he got the first dose on Tuesday. The patient tells me that he fell earlier this week twisting his left foot and ankle and he is swelling. He continues to not look well. He had blood cultures done at dialysis on Tuesday he is not been informed of the results. 12/07/2018; the patient was admitted to hospital from 11/22/2018 through 12/02/2018. He underwent a left BKA. Apparently had staph sepsis. In the meantime being off the right foot this is closed over. He follows up with Dr. 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 Subsequent to this he actually had multiple falls in fact he tells me to following which calls the wound to be his apparently. He has been seeing Dr. [name] and his physician assistant in the interim. They actually did want him to come back for reevaluation to see if there's anything we can do for  me wound care perspective the help this area to heal more appropriately. The patient states that he does not have a tremendous amount of pain which is good news. No fevers, chills, nausea, or vomiting noted at this time. We have gotten approval from Dr. [name] office had [place] for [place] to treat the patient for his stop wound. This is due to the fact that the patient was in the 90 day postop global. 1/21; the patient does not have an open wound on the right foot although he does have some pressure areas that will need to be padded when he is transferring. The dehisced surgical wound looks clean although there is some undermining of 1.5 cm superiorly. We have been  using calcium alginate 1/28; patient arrives in our clinic for review of the left BKA stump wound. This appears healthy but there is undermining. TheraSkin #1 2/11; TheraSkin #2 2/25; TheraSkin #3. Wound is measuring smaller 3/10; TheraSkin #4 wound is measuring smaller 3/24; TheraSkin #5 wound is measuring smaller and looks healthy. 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 He has no open area on the right foot although he does have some callused area over the original third metatarsal head wound. The third metatarsal is also subluxed on the right foot. I have warned him today that he cannot consider wearing a prosthesis for at least a month but he can go for measurements. He is going to need to keep the right foot padded is much as possible in his diabetic shoe indefinitely READMISSION 06/12/2019 [name] is a type II diabetic on dialysis. He has been in this clinic multiple times with wounds on his bilateral lower extremities. 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 He also has a history of wounds on his right foot but tells me that everything is going well here. He actually came in  here with his prosthesis walking with a cane. We were all really quite gratified to see this He states over the last several weeks he has had a painful area at the tip of the left third finger. His dialysis shunt in his is in the left upper arm and this particularly hurts during dialysis when his fingers get numb and there is a lot of pain in the tip of his left third finger. I believe he is seen vascular surgery. He had a Doppler done to evaluate for a dialysis steal syndrome. He is going for a banding procedure 1 week from today towards the left AV fistula. I think if that is unsuccessful they will be looking at creating a new shunt. He had a course of doxycycline when he which he finished about a week ago. He has been using Bactroban to the finger 6/25; the patient had his banding procedure and things seem to be going better. He is not having pain in his hand at dialysis. The area on the tip of his finger seems about closed. He did however have a paronychia I the last time I saw him. I have been advising him to use topical Bactroban washing the finger. Culture I did last time showed a few staph epidermidis which I was willing to dismiss as superficial skin contaminant. He arrives today with the finger wound looking better however the paronychia a seems worse 7/9; 2-week follow-up. He does not have an open wound on the tip of his third left finger. I thought he had an initial ischemic wound on the tip of the finger as well as a paronychia a medially. All of this appears to be closed. 8/6-Patient returns after 3 weeks for follow-up and has a right second met head plantar callus with a significant area of maceration hiding an ulcer ABI repeated today on right - 1.26 8/13-Patient returns at 1 week, the right second met head plantar wound appears to be slightly better, it is surrounded by the callus area we are using silver alginate 8/20; the patient came back to clinic 2 weeks ago with a wound on  the right second metatarsal head. He apparently told me he developed callus  in this area but also went away from his custom shoes to a pair of running shoes. Using silver alginate. 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 There might be much we can do to offload this although the patient tells me he has a wheelchair that he is using to try and stay off the wound is much as possible 8/27; right second metatarsal head. Still small punched out area with thick callus and subcutaneous tissue around the wound. We have been using silver collagen. This is always been an issue with this man's wound especially on this foot 9/3; right second met head. Still the same punched-out area with thick callus and subcutaneous tissue around the wound removing the circumference demonstrates repetitively undermining area. I have removed all the subcutaneous tissue associated with this. We have been using silver collagen 9/15; right second metatarsal head. Same punched-out thick callus and subcutaneous tissue around the wound area. Still requiring debridement we have been using silver collagen I changed back to silver alginate X-ray last time showed no evidence of osteomyelitis. Culture showed Klebsiella and he was started and Keflex apparently just 4 days ago 9/24; right second metatarsal head. He is completed the antibiotics I gave him for 10 days. Same punched-out thick callus and subcutaneous tissue around the area. Still requiring debridement. We have been using silver alginate. The area itself looks swollen and this could be just [place] that comes on this area with walking on a prosthesis on the left however I think he needs an MRI and I am going to order that today. There is not an option to offload this further 10/1; right second plantar metatarsal head. MRI booked for October 6. Generally looking better today using silver alginate 10/8; right second plantar metatarsal head. MRI that was  done on 10/6 was negative for osteomyelitis noted prior amputations of the second and fourth toes at the MTP. Prior amputation of the third toe to the base of the middle phalanx. Prior amputation of the fifth toe to the head of the metatarsal. 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 He does not recall about hearing about this. He did not have any pain but then again he has reduced sensation from diabetic neuropathy 10/15; right second plantar metatarsal head. Still in the same condition callus nonviable subcutaneous tissue which cleans up nicely with debridement but reforms by the next week. The patient tells me that he is offloading this is much as he can including taking his wheelchair to dialysis. We have been using silver alginate, changed to silver collagen today 10/22; right second plantar metatarsal head. Wound measures smaller but still thick callus around this wound. Still requiring debridement 11/5 right second plantar metatarsal head. Less callus around the wound circumference but still requiring debridement. There is still some undermining which I cleaned out the wound looks healthy but still considerable punched out depth with a relatively small circumference to the wound there is no palpable bone no purulent drainage 11/12; right second plantar metatarsal head. Small wound with thick callus tissue around the wound and significant undermining. We have been using silver collagen after my continuous debridements of this area Electronic Signature(s) Signed: 11/08/2019 5:47:05 PM By: Linton Ham MD Entered By: Linton Ham on 11/08/2019 14:33:23 -------------------------------------------------------------------------------- Physical Exam Details Patient Name: Date of Service: [name], [name] 11/08/2019 12:30 PM Medical Record [id number] Patient Account Number: [id number] Date of Birth/Sex: 03-04-1951 (68 y.o. M)  Treating RN: Deon Pilling Primary Care Provider: [name] Other Clinician: Referring Provider: Treating Provider/Extender:Zackry Deines, Jeronimo Greaves, Buena Irish in Treatment: 21 Constitutional Sitting or standing Blood Pressure is within target range for patient.. Pulse regular and within target range for patient.Marland Kitchen Respirations regular, non-labored and within target range.. Temperature is normal and within the target range for the patient.Marland Kitchen Appears in no distress. Notes Wound exam; no debridement I am going to try to put endoform down underneath the skin to see if we can stimulate some granulation. Continue with unroofing of the undermining at this area does not seem to have resulted in closure. There is no evidence of surrounding infection Electronic Signature(s) Signed: 11/08/2019 5:47:05 PM By: Linton Ham MD Entered By: Linton Ham on 11/08/2019 14:34:22 -------------------------------------------------------------------------------- Physician Orders Details Patient Name: Date of Service: ALBERTO, PINA 11/08/2019 12:30 PM Medical Record [id number] Patient Account Number: [id number] Date of Birth/Sex: 12-16-1951 (68 y.o. 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 Dressing Change Frequency Wound #41 Right Metatarsal head second Change Dressing every other day. Skin Barriers/Peri-Wound Care Barrier cream - as needed for maceration, wetness, or redness. Wound Cleansing May  shower and wash wound with soap and water. Primary Wound Dressing Wound #41 Right Metatarsal head second Endoform - pack into undermining as well. Secondary Dressing Wound #41 Right Metatarsal head second Kerlix/Rolled Gauze Dry Gauze Other: - felt to periwound and place one below wound. lightly wrap coban to hold dressing in place. felt / pad wound Edema Control Avoid standing for long periods of time Elevate legs to the level of the heart or above for 30 minutes daily and/or when sitting, a frequency of: - throughout the day. Off-Loading Other: - felt to periwound and place one below wound. Additional Orders / Instructions Other: - minimize walking and standing on foot to aid in wound healing and callus buildup. Electronic Signature(s) Signed: 11/08/2019 5:17:30 PM By: Deon Pilling Signed: 11/08/2019 5:47:05 PM By: Linton Ham MD Entered By: Deon Pilling on 11/08/2019 13:39:42 -------------------------------------------------------------------------------- Problem List Details Patient Name: Date of Service: [name], [name] 11/08/2019 12:30 PM Medical Record [id number] Patient Account Number: [id number] Date of Birth/Sex: May 12, 1951 (68 y.o. 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, 06/12/2019 No Yes other extremity Inactive Problems ICD-10 Code Description Active Date Inactive Date L03.114 Cellulitis of left upper limb 06/12/2019 06/12/2019 L98.498 Non-pressure chronic ulcer of skin of other sites with  other 06/12/2019 06/12/2019 specified severity S61.209D Unspecified open wound of unspecified finger without damage 06/12/2019 06/12/2019 to nail, subsequent encounter Resolved Problems Electronic Signature(s) Signed: 11/08/2019 5:47:05 PM By: Linton Ham MD Entered By: Linton Ham on 11/08/2019 14:32:24 -------------------------------------------------------------------------------- Progress Note Details Patient Name: Date of Service: [name] 11/08/2019 12:30 PM Medical Record [id number] Patient Account Number: [id number]  Date of Birth/Sex: 25-Jul-1951 (68 y.o. M) Treating RN: Deon Pilling Primary Care Provider: [name] Other Clinician: Referring Provider: Treating Provider/Extender:Abbygail [place], Jeronimo Greaves, Buena Irish in Treatment: 21 Subjective History of Present Illness (HPI) The following HPI elements were documented for the patient's wound: Location: Patient presents with a wound to bilateral feet. Quality: Patient reports experiencing essentially no pain. Severity: Mildly severe wound with no evidence of infection Duration: Patient has had the wound for greater than 2 weeks prior to presenting for treatment The patient is a pleasant 68 yrs old bm here for evaluation of ulcers on the plantar aspect of both feet. 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 He has a left arm graft for access. He has been trying to stay off his feet for weeks but does not seem to have any improvement in the wound. He has been seeing someone at the foot center and was referred to the Wound Care center for further evaluation. 12/30/15 the patient has 3 wounds one over the right first metatarsal head and 2 on the left foot at the left fifth and lleft first metatarsal head. All of these look relatively similar. The one over the left fifth probe to bone I could not prove that any of the others did. I note his MRI in November  that did not show osteomyelitis. His peripheral pulses seem robust. All of these underwent surgical debridement to remove callus nonviable skin and subcutaneous tissue 01/06/16; the patient had his sutures removed from the right fifth ray amputation. There may be a small open part of this superiorly but otherwise the incision looks good. Areas over his right first, left first and left fifth metatarsal head all underwent surgical debridement as a varying degree of callus, skin and nonviable subcutaneous tissue. The area that is most worrisome is the right fifth metatarsal head which has a wound probes precariously close to bone. There is no purulent drainage or erythema 01/13/16; I'm not exactly sure of the status of the right fifth ray amputation site however he follows with Dr. [name] later this week. The area over his right first plantar metatarsal head, left first and fifth plantar metatarsal head are all in the same status. Thick circumferential callus, nonviable subcutaneous tissue. Culture of the left fifth did not culture last week 01/20/16; all of the patient's wounds appear and roughly the same state although his amputation site on the right lateral foot looks better. His wounds over the right first, left first and left fifth metatarsal heads all underwent difficult surgical debridement removing circumferential callus nonviable skin and subcutaneous tissue. There is no overt evidence of infection in these areas. MRI at the end of December of the left foot did not show osteomyelitis, right foot showed osteomyelitis of the right fifth digit he is now status post amputation. 01/27/16 the patient's wounds over his plantar first and fifth metatarsal heads on the left all appear better having been started on a total contact cast last week. They were debridement of circumferential callus and nonviable subcutaneous tissue as was the wound over the first metatarsal head. His surgical incision on the  right also had some light surface debridement done. 03/23/2016 -- the patient was doing really well and most of his wounds had almost completely healed but now he came back today with a history of having a discharge from the area of his right foot on the plantar aspect and also between his first and second  toe. He also has had some discharge from the left foot. Addendum: I spoke to the PA [name] at the dialysis center whose fax number is [phone]. We discussed the infection the patient has and she will put the patient on vancomycin and Fortaz until the final culture report is back. We will fax this as soon as available. 03/30/2016 -- left foot x-ray IMPRESSION:No definitive osteomyelitis noted. X-ray of the right foot -- IMPRESSION: 1. Soft tissue swelling. Prior amputation right fifth digit. No acute or focal bony abnormality identified. If osteomyelitis remains a clinical concern, MRI can be obtained. 2. Peripheral vascular disease. His culture reports have grown an MSSA -- and we will fax this report to his hemodialysis center. He was called in a prescription of oral doxycycline but I have told her not to fill this in as he is already on IV antibiotics. 04/06/2016 -- a few days ago, I spoke to the hemodialysis center nurse who had stopped the IV antibiotics and he was given a prescription for doxycycline 100 mg by mouth twice a day for a week and he is on this at the present time. 05/11/2016 -- he has recently seen his PCP this week and his hemoglobin A1c was 7. He is working on his paperwork to get his orthotic shoes. 05/25/2016 -- -- x-ray of the left foot IMPRESSION:No acute bony abnormality. No radiographic changes of acute osteomyelitis. No change since prior study. X-ray of the right foot -- IMPRESSION: Postsurgical changes are seen involving the fifth toe. No evidence of acute osteomyelitis. he developed a large blister on the medial part of his right foot and this  opened out and drain fluid. 06/01/2016 -- he has his MRI to be done this afternoon. 06/15/2016 -- MRI of the left forefoot without contrast shows 2 separate regions of cutaneous and subcutaneous edema and possible ulceration and blistering along the ball of the foot. No obvious osteomyelitis identified. MRI of the right foot showed cutaneous and subcutaneous thickening plantar to the first digit sesamoid with an ulcer crater but no underlying osteomyelitis is identified. 06/22/2016 -- the right foot plantar ulcer has been draining a lot of seropurulent material for the last few days. 06/30/2016 -- spoke to the dialysis center and I believe I spoke to [place] a PA at the center who discussed with me and agreed to putting Legrand Como on vancomycin until his cultures arrive. On review of his culture report no WBCs were seen or no organisms were seen and the culture was reincubated for better growth. The final report is back and there were no predominant growth including Streptococcus or Staphylococcus. Clinically though he has a lot of drainage from both wounds a lot of undermining and there is further blebs on the left foot towards the interspace between his first and second toe. 08/10/2016 -- the culture from the right foot showed normal skin flora and there was no Staphylococcus aureus was group A streptococcus isolated. 09/21/2016 -- -- MRI of the right foot was done on 09/13/2016 - IMPRESSION: Findings most consistent with acute osteomyelitis throughout the great toe, sesamoid bones and plantar aspect of the head of the first metatarsal. Fluid in the sheath of the flexor tendon of the great toe could be sympathetic but is worrisome for septic tenosynovitis. First MTP joint effusion worrisome for septic joint. He was admitted to the hospital on 09/11/2016 and treated for a fever with vancomycin and cefepime. He was seen by Dr. Bobby Rumpf of infectious disease who recommended  6 weeks treatment  with vancomycin and ceftazidime with his hemodialysis and to continue to see as in the wound clinic. Vascular consult was pending. The patient was discharged home on 09/14/2016 and he would continue with IV antibiotics for 6 weeks. 09/28/16 wound appears reasonably healthy. Continuing with total contact cast 10/05/16 wound is smaller and looking healthy. Continue with total contact cast. He continues on IV antibiotics 10/12/2016 -- he has developed a new wound on the dorsal aspect of his left big toe and this is a superficial injury with no surrounding cellulitis. He has completed 30 days of IV antibiotics and is now ready to start his hyperbaric oxygen therapy as per his insurance company's recommendation. 10/19/2016 -- after the cast was removed on the right side he has got good resolution of his ulceration on the right plantar foot. he has a new wound on the left plantar foot in the region of his fourth metatarsal and this will need sharp debridement. 10/26/2016 -- Xray of the right foot complete: IMPRESSION: Changes consistent with osteomyelitis involving the head of the first metatarsal and base of the first proximal phalanx. The sesamoid bones are also likely involved given their positioning. 11/02/2016 -- there still awaiting insurance clearance for his hyperbaric oxygen therapy and hopefully he will begin treatment soon. 11/09/2016 -- started with hyperbaric oxygen therapy and had some barotrauma to the right ear and this was seen by ENT who was prescribed Afrin drops and would probably continue with HBO and he is scheduled for myringotomy tubes on Friday 11/16/2016 -- he had his myringotomy tubes placed on Friday and has been doing much better after that with some fluid draining out after hyperbaric treatment today. The pain was much better. 11/30/2016 -- over the last 2 days he noticed a swelling and change of color of his right second toe and this had been draining minimal  fluid. 12/07/2016 -- x-ray of the right foot -- IMPRESSION:1. Progressive ulceration at the distal aspect of the second digit with significant soft tissue swelling and osseous changes in the distal phalanx compatible with osteomyelitis. 2. Chronic osteomyelitis at the first MTP joint. 3. Ulcerations at the second and third toes as well without definite osseous changes. Osteomyelitis is not excluded. 4. Fifth digit amputation. On 12/01/2016 oo I spoke to Dr. Bobby Rumpf, the infectious disease specialist, who kindly agreed to treat this with IV antibiotics and he would call in the order to the dialysis center and this has been discussed in detail with the patient who will make the appropriate arrangements. The patient will also book an appointment as soon as possible to see Dr. [name] in the office. Of note the patient has not been on antibiotics this entire week as the dialysis center did not receive any orders from Dr. [name]. I got in touch with Dr. [name] who tells me the patient has an appointment to see him this coming Wednesday. 12/14/2016 -- the patient is on ceftazidime and vancomycin during his dialysis, and I understand this was put on by his nephrologist Dr. Raliegh Ip possibly after speaking with infectious disease Dr. [name] 12/23/16; patient was on my schedule today for a wound evaluation as he had difficulties with his schedule earlier this week. I note that he is on vancomycin and ceftazidine at dialysis. In spite of this he arrives today with a new wound on the base of the right second toe this easily probes to bone. The known wound at the tip of the second toe With this  area. He also has a superficial area on the medial aspect of the third toe on the side of the DIP. This does not appear to have much depth. The area on the plantar left foot is a deep area but did not probe the bone 12/28/16 -- he has brought in some lab work and the most recent labs done showed a hemoglobin of 12.1  hematocrit of 36.3, neutrophils of 51% WBC count of 5.6, BN of 53, albumin of 4.1 globulin of 3.7, vancomycin 13 g per mL. 01/04/2017 -- he saw Dr. [name] who has recommended a amputation of the right second toe and he is awaiting this date. He sees Dr. Bobby Rumpf of infectious disease tomorrow. The bone culture taken on 12/28/2016 had no growth in 2 days. 01/11/2017 -- was seen by Dr. Bobby Rumpf regarding the management and has recommended a eval by vascular surgeons. He recommended to continue the antibiotics during his hemodialysis. Xray of the left foot -- IMPRESSION: No acute fracture, dislocation, or osseous erosion identified. 01/18/2017 -- he has his vascular workup later today and he is going to have his right foot second toe amputation this coming Friday by Dr. [name]. We have also put in for a another 30 treatments with hyperbaric oxygen therapy 01/25/2017 -- I have reviewed Dr. Donnetta Hutching is vascular report from last week where he reviewed him and thought his vascular function was good enough to heal his amputation site and no further tests were recommended. He also had his orthopedic related to surgery which is still pending the notes and we will review these next week. 02/01/2017 -- the operative remote of Dr. 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 02/08/2017 --he was seen by Dr. [name] on 02/03/2017 -- patient is on Susan Moore. after a thorough review he had recommended to continue with antibiotics during hemodialysis. The patient was seen by Dr. [name], but I do not find any follow-up note on the electronic  medical record. he has removed the dressing over the right foot to remove the sutures and asked him to see him back in a month's time 02/22/2017 -- patient has been febrile and has been having symptoms of the upper respiratory tract infection but has not been checked for the flu and it's been over 4 days now. He says he is feeling better today. Some drainage between his left first and second toe and this needed to be looked at 03/08/2017 -- was seen by Dr. Bobby Rumpf on 03/07/2017 after review he will stop his antibiotics and see him back in a month to see if he is feeling well. 03/15/2017 -- he has completed his course of hyperbaric oxygen therapy and is doing fine with his health otherwise. 03/22/2017 -- his nutritionist at the dialysis center has recommended a protein supplement to help build his collagen and we will prescribe this for him when he has the details 05/03/2017 -- he recently noticed the area on the plantar aspect of his fifth metatarsal head which opened out and had minimal drainage. 05/10/2017 -- -- x-ray of the left foot -- IMPRESSION:1. No convincing conventional radiographic evidence of active osteomyelitis.2. Active soft tissue ulceration at the tip  of the fifth digit, and at the lateral aspect of the foot adjacent the base of the fifth metatarsal. 3. Surgical changes of prior fourth toe amputation. 4. 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 5. Small vessel atherosclerotic vascular calcifications. 6. 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 ======= Readmission after 5 weeks: 06/15/2017 -- the patient returns after 5 weeks having had an MRI done on 05/17/2017 MRI of the left foot without contrast showed soft tissue ulcer overlying the base of the fifth metatarsal. 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 No drainable fluid collection to  suggest an abscess. He was in hospital between 05/16/2017 and 05/25/2017 -- and on discharge was asked to follow-up with Dr. [name] and Dr. [name]. He was treated 2 weeks post discharge with vancomycin plus ceftriaxone with hemodialysis and oral metronidazole 500 mg 3 times a day. 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 last hemoglobin A1c was 6.7% He had a postoperative MR of the foot on 05/23/2017 -- which showed no new areas of cortical bone loss, edema or soft tissue ulceration to suggest osteomyelitis. the patient has completed his course of IV antibiotics during dialysis and is to see the infectious disease doctor tomorrow. Dr. [name] had seen him after suture removal and asked him to keep the wound with a dry dressing 06/21/2017 -- he was seen by Dr. Bobby Rumpf of infectious disease on 06/16/2017 -- he stopped his Flagyl and will see him back in 6 weeks. He was asked to follow-up with Dr. [name] and with the wound clinic clinic. 07/05/17; bone biopsy from last time showed acute osteomyelitis. This is from the reminiscent in the left fifth metatarsal. Culture result is apparently still pending [holding for anaerobe}. From my understanding in this case this is been a progressive necrotic wound which is deteriorated markedly over the last 3 weeks since he returned here. He now has a large area of exposed bone which was biopsied and cultured last week. Dr. Graylon Good has put him on vancomycin and Fortaz during his hemodialysis and Flagyl orally. He is to see Dr. [name] next week 07/19/2017 -- the patient was reviewed by Dr. [name] of orthopedics who reviewed the case in detail and agreed with the plan to continue with IV antibiotics, aggressive wound care and hyperbaric oxygen therapy. He would see him back in 3 weeks' time 08/09/2017 -- saw Dr. Bobby Rumpf on 08/03/2017 -he was restarted on his  antibiotics for 6 weeks which included vanco, ceftaz and Flagyl. he recommended continuing his antibiotics for 6 weeks and reevaluate his completion date. He would continue with wound care and hyperbaric oxygen therapy. 08/16/2017 -- I understand he will be completing his 6 weeks of antibiotics sometime later this week. 09/19/2017 -- he has been without his wound VAC for the last week due to lack of supplies. He has been packing his wound with silver alginate. 10/04/2017 -- he has an appointment with Dr. [name] tomorrow and hence we will not apply the wound VAC after his dressing changes today. 10/11/2017 -- he was seen by Dr. Bobby Rumpf on 10/05/2017, noted that the patient was currently off antibiotics, and after thorough review he recommended he follow-up with the wound care as per plan and no further antibiotics at this point. 11/29/17; patient is arrived for the wound on his left lateral  foot.. He is completed IV antibiotics and 2 rounds of hyperbaric oxygen for treatment of underlying osteomyelitis. He arrives today with a surface on most of the wound area however our intake nurse noted drainage from the superior aspect. This was brought to my attention. 12/13/2017 -- the right plantar foot had a large bleb and once the bleb was opened out a large callus and subcutaneous debris was removed and he has a plantar ulcer near the fourth metatarsal head 12/28/17 on evaluation today patient appears to be doing acutely worse in regard to his left foot. The wound which has been appearing to do better as now open up more deeply there is bone palpable at the base of the wound unfortunately. He tells me that this feels "like it did when he had osteomyelitis previously" he also noted that his second toe on the left foot appears to be doing worse and is swollen there does appear to be some fluid collected underneath. His right foot plantar ulcer appears to be doing somewhat better at this point and  there really is no complication at the site currently. No fevers, chills, nausea, or vomiting noted at this time. Patient states that he normally has no pain at this site however. Today he is having significant pain. 01/03/18; culture done last week showed methicillin sensitive staph aureus and group B strep. He was on Septra however he arrives today with a fever of 101. He has a functional dialysis shunt in his left arm but has had no pain here. No cough. He still makes some urine no dysuria he did have abdominal pain nausea and diarrhea over the weekend but is not had any diarrhea since yesterday. Otherwise he has no specific complaints 01/05/18; the patient returns today in follow-up for his presentation from 1/8. At that point he was febrile. I gave him some Levaquin adjusted for his dialysis status. He tells me the fever broke that night and it is not likely that this was actually a wound infection. He is gone on to have an MRI of the left foot; this showed interval development of an abnormal signal from the base of the third and fourth metatarsal and in the cuboid reminiscent consistent with osteomyelitis. There is no mention of the left second toe I think which was a concern when it was ordered. The patient is taking his Levaquin 01/10/18; the patient has completed his antibiotics today. The area on the left lateral foot is smaller but it still probes easily to bone. He has underlying osteomyelitis here and sees Dr. Megan Salon of infectious disease this week ooThe area on the right third plantar metatarsal head still requires debridement not much change in dimensions [facility] has a new wound on the medial tip of his right third toe again this probes to bone. He only noticed this 2 days ago 01/17/18; the patient saw Dr. [name] last week and he is back on Vanco and Fortaz at dialysis. This is related to the osteomyelitis in the base of his left lateral foot which I think is the bases of his third and  fourth metatarsals and cuboid remnant. We are previously seeing him for a wound also on the base of the fourth med head on the right. X- ray of this area did not show osteomyelitis in relation to a new wound on the tip of his right third toe. Again this probes to bone. Finally his second toe on the left which didn't really show anything on the MRI at least  no report appears to have a separated cutaneous area which I think is going to come right off the tip of his toe and leave exposed bone. Whether this is infectious or ischemic I am not clear Culture I did from the third toe last week which was his new wound was negative. Plain x-ray on the right foot did not show osteomyelitis in the toes 01/24/18; the patient is going went to see Dr. [name]. He is going to have a amputation of the left second and right third toe although paradoxically the left second toe looks better than last week. We have treating a deep probing wound on the left lateral foot and the area over the third metatarsal head on the right foot. 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 Also a debridement including bone of the left lateral foot and a closure which is still sutured. This is a bit surprising. Also apparent debridement of the base of the right third toe wound. 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 I'm not sure exactly where we are in the frame of things here. The wound on the left lateral foot is still sutured also the amputation sites of the left second and right third toe. He is been using Xeroform to the sutured areas silver alginate to the right foot 02/14/18; he continues on Rocephin at dialysis for osteomyelitis. I still don't have a good sense of where we are in the treatment  duration. The wounds on the left lateral foot had the sutures removed and this clearly still probes deeply. We debrided the area with a #5 curet. We'll use silver alginate to the wound oothe area over the right third met head also required debridement of callus skin and subcutaneous tissue and necrotic debris over the wound surface. This tunnels superiorly but I did not unroofed this today. ooThe areas on the tip of his toes have a crusted surface eschar I did not debridement this either. 02/21/18; he continues on Rocephin at dialysis for osteomyelitis. I don't have a good sense of where we are and the treatment duration. The wounds on the left lateral foot is callused over on presentation but requires debridement. The area on the right third med head plantar aspect actually is measuring smaller 02/28/18;patient is on Vanco and Fortaz at dialysis, I'm not sure why I thought this was Rocephin above unless it is been changed. I don't have a good sense of time frame here. Using silver collagen to the area on the left lateral foot and right third med head plantar foot 03/07/18; patient is completing bank and Fortaz at dialysis soon. He is using Silver collagen to the area on the left lateral foot and the right third metatarsal head. He has a smaller superficial wound distal to the left lateral foot wound. 03/14/18-he is here in follow-up evaluation for multiple ulcerations to his bilateral feet. He presents with a new ulceration to the plantar aspect of the left foot underneath the blister, this was deroofed to reveal a partial thickness ulcer. He is voicing no complaints or concerns, tolerated dialysis yesterday. We will continue with same treatment plan and he will follow-up next week 03/21/18; the patient has a area on the lateral left foot which still has a small probing  area. The overall surface area of the wound is better. He presented with a new ulceration on the left foot plantar fifth metatarsal  head last week. The area on the right third metatarsal head appears smaller.using silver alginate to all wound areas. The patient is changing the dressing himself. He is using a Darco forefoot offloading are on the right and a healing sandal on the left 03/28/18; original wound left lateral foot. Still nowhere close to looking like a heeling surface oosecondary wound on the left lateral foot at the base of his question fifth metatarsal which was new last week ooRight third metatarsal head. ooWe have been using silver alginate all wounds 04/04/18; the original wound on the left lateral foot again heavy callus surrounding thick nonviable subcutaneous tissue all requiring debridement. The new wound from 2 weeks ago just near this at the base of the fifth metatarsal head still looks about the same ooUnfortunately there is been deterioration in the third medical head wound on the right which now probes to bone. I must say this was a superficial wound at one point in time and I really don't have a good frame of reference year. I'll have to go back to look through records about what we know about the right foot. 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 He is also been to see Dr. [name]. Somewhere in here as somebody is ordered a VASCULAR evaluation which was done on 03/22/18. On the right is anterior tibial artery was monophasic triphasic at the posterior tibial artery. On the left anterior tibial artery monophasic posterior tibial artery monophasic. ABI in the right was 1.43 on the left 1.21. TBIs on the right at 0.41 and on the left at 0.32. A vascular consult was recommended and I think has been arranged. 04/11/18; [name] has not had an MRI of the right foot since 2017. Recent x-ray of the right foot done in January and February was negative. However he has had a major deterioration in the wound over  the third met head. He is completed antibiotics last week at dialysis [place]. I think he is going to see vascular in follow-up. The areas on the left lateral foot and left plantar fifth metatarsal head both look satisfactory. I debrided both of these areas although the tissue here looks good. The area on the left lateral foot once probe to bone it certainly does not do that now 04/18/18; MRI of the right foot is on Thursday. He has what looks to be serosanguineous purulent drainage coming out of the wound over the right third metatarsal head today. He completed antibiotics bank and Fortaz 2 weeks ago at dialysis. He has a vascular follow-up with regards to his arterial insufficiency although I don't exactly see when that is booked. The areas on the left foot including the lateral left foot and the plantar left fifth metatarsal head look about the same. 04/25/18-He is here in follow-up evaluation for bilateral foot ulcers. MRI obtained was negative for osteomyelitis. Wound culture was negative. We will continue with same treatment plan he'll follow-up next week 05/02/18; the right plantar foot wound over the third metatarsal head actually looks better than when I last saw this. His MRI was negative for osteomyelitis wound culture was negative. ooOn the left plantar foot both wounds on the plantar fifth metatarsal head and on the lateral foot both are covered in a very  hard circumferential callus. 05/09/18; right plantar foot wound over the third metatarsal head stable from last week. ooLeft plantar foot wound over the fifth metatarsal head also stable but with callus around both wound areas ooThe area on the left lateral foot had thick callus over a surface and I had some thoughts about leaving this intact however it felt boggy. ooWe've been using silver alginate all wounds 05/16/18; since the patient was last here he was hospitalized from 5/15 through 5/19. 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 This grew MRSA. He was placed on vancomycin.MRI of the left foot suggested "progressive" osteomyelitis and bone destruction of the cuboid and the base of the fifth metatarsal. Stable erosive changes of the base of the second metatarsal. I'm wondering if they're aware that he had surgery and debridement in the area of the underlying bone previously by Dr. [name]. In any case, He was also revascularized with an angioplasty and stenting of the left tibial peroneal trunk and angioplasty of the left posterior tibial artery. He is on vancomycin at dialysis. He has a 2 week follow-up with infectious disease. He has vascular surgery follow-up. He was started on Plavix. 05/23/18; the patient's wound on the lateral left foot at the level of the fifth metatarsal head and the plantar wound on the plantar fifth metatarsal head both look better. The area on the right third metatarsal head still has depth and undermining. We've been using silver alginate. He is on vancomycin. He has been revascularized on the left 05/30/18; he continues on vancomycin at dialysis. Revascularized on the left. The area on the left lateral foot is just about closed. Unfortunately the area over the plantar fifth metatarsal head undermining superiorly as does the area over the right third metatarsal head. Both of these significantly deteriorated from last week. He has appointments next week with infectious disease and orthopedics I delayed putting on a cast on the left until those appointments which therefore we made we'll bring him in on Friday the 14th with the idea of a cast on the left foot 06/06/18; he continues on vancomycin at dialysis. Revascularized on the left. He arrives today with a ballotable swelling just above the area on the left lateral foot where his previous wound was. He has no pain but he  is reasonably insensate. The difficult areas on the plantar left fifth metatarsal head looks stable whereas the area on the right third plantar metatarsal head about the same as last week there is undermining here although I did not unroofed this today. He required a considerable debridement of the swelling on the left lateral foot area and I unroofed an abscess with a copious amount of brown purulent material which I obtained for culture. I don't believe during his recent hospitalization he had any further imaging although I need to review this. Previous cultures from this area done in this clinic showed MRSA, I would be surprised if this is not what this is currently even though he is on vancomycin. 06/13/18; he continues on vancomycin at dialysis however he finishes this on Sunday and then graduates the doxycycline previously prescribed by Dr. [name]. The abscess site on the lateral foot that I unroofed last time grew moderate amounts of methicillin-resistant staph aureus that is both vancomycin and tetracycline sensitive so we should be okay from that regard. He arrives today in clinic with a connection  between the abscess site and the area on the lateral foot. We've been using silver alginate to all his wound areas. The right third metatarsal head wound is measuring smaller. Using silver alginate on both wound areas 06/20/18; transitioning to doxycycline prescribed by Dr. [name]. The abscess site on the lateral foot that I unroofed 2 weeks ago grew MRSA. That area has largely closed down although the lateral part of his wound on the fifth metatarsal head still probes to bone. I suspect all of this was connected. ooOn the right third metatarsal head smaller looking wound but surrounded by nonviable tissue that once again requires debridement using silver alginate on both areas 06/27/18; on doxycycline 100 twice a day. The abscess on the lateral foot has closed down. Although he still has  the wound on the left fifth metatarsal head that extends towards the lateral part of the foot. The most lateral part of this wound probes to bone ooRight third metatarsal head still a deep probing wound with undermining. Nevertheless I elected not to debridement this this week ooIf everything is copious static next week on the left I'm going to attempt a total contact cast 07/04/18; he is tolerating his doxycycline. The abscess on the left lateral foot is closed down although there is still a deep wound here that probes to bone. We will use silver collagen under the total contact cast Silver collagen to the deep wound over the right third metatarsal head is well 07/11/18; he is tolerating doxycycline. The left lateral fifth plantar metatarsal head still probes deeply but I could not probe any bone. We've been using silver collagen and this will be the second week under the total contact cast ooSilver collagen to deep wound over the right third metatarsal head as well 07/18/18; he is still taking doxycycline. The left lateral fifth plantar metatarsal this week probes deeply with a large amount of exposed bone. Quite a deterioration. He required extensive debridement. Specimens of bone for pathology and CNS obtained ooAlso considerable debridement on the right third metatarsal head 07/25/18; bone for pathology last week from the lateral left foot showed osteomyelitis. CandS of the bone Enterobacter. And Klebsiella. He is now on ciprofloxacin and the doxycycline is stopped [Dr. [name] of infectious disease] area He has an appointment with Dr. [name] tomorrow I'm going to leave the total contact cast off for this week. May wish to try to reapply that next week or the week after depending on the wound bed looks. I'm not sure if there is an operative option here, previously is followed with Dr. [name] 08/01/18 on evaluation today patient presents for reevaluation. He has been seen by infectious  disease, Dr. [name], and he has placed the patient back on Ceftaz currently to be given to him at dialysis three times a week. 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 Fortunately there does not appear to be the evidence of infection at this point in time. Overall the patient has been tolerating the dressing changes without complication. We are no longer due lives in the cast of left foot secondary to the infection obviously. 08/10/18 on evaluation today patient appears to be doing rather well all things considered in regard to his left plantar foot ulcer. He does have a significant callous on the lateral portion of his left foot he wonders if I can help clean that away to some degree today. Fortunately he is having no evidence  of infection at this time which is good news. No fevers chills noted. With that being said his right plantar foot actually does have a significant callous buildup around the wound opening this seems to not be doing as well as I would like at this point. I do believe that he would benefit from sharp debridement at this site. Subsequently I think a total contact cast would be helpful for the right foot as well. 08/17/18; the patient arrived today with a total contact cast on the right foot. This actually looks quite good. He had gone to see Dr. Megan Salon of infectious disease about the osteomyelitis on the left foot. I think he is on IV Fortaz at dialysis although I am not exactly sure of the rationale for the Fortaz at the time of this dictation. He also arrived in today with a swelling on the lateral left foot this is the site of his original wounds in fact when I first saw this man when he was under Dr. Ardeen Garland care I think this was the site of where the wound was located. It was not particularly tender however using a small scalpel I opened it to [place] some moderate amount of purulent drainage. We've  been using silver alginate to all wound areas and a total contact cast on the right foot 08/24/18; patient continues on Fortaz at dialysis for osteomyelitis left foot. Last MRI was in May that showed progressive osteomyelitis and bone destruction of the cuboid and base of the fifth metatarsal. Last week he had an abscess over this same area this was removed. Culture was negative. ooWe are continuing with a total contact cast to the area on the third metatarsal head on the right and making some good progress here. The patient asked me again about renal transplant. He is not on the list because of the open wounds. 08/31/18; apparently the patient had an interruption in the [place] but he is now back on this at dialysis. Apparently there was a misunderstanding and Dr. Crissie Figures orders. Due to have the MRI of the left foot tonight. ooThe area on the right foot continues to have callus thick skin and subcutaneous tissue around the wound edge that requires constant debridement however the wound is smaller we are using a total contact cast in this area. Alginate all wounds 09/07/18; without much surprise the MRI of his left foot showed osteomyelitis in the reminiscent of his fourth metatarsal but also the third metatarsal. She arrives in clinic today with the right foot and a total contact cast. There was purulent drainage coming out of the wound which I have cultured. Marked deterioration here with undermining widely around the wound orifice. Using pickups and a scalpel I remove callus and subcutaneous tissue from a substantial new opening. ooIn a similar fashion the area on the left lateral foot that was blistered last week I have opened this and remove skin and subcutaneous tissue from this area to expose a obvious new wound. I think there is extension and communication between all of this on the left foot. ooThe patient is on Fortaz at dialysis. Culture done of the right foot. We are clearly not  making progress here. ooWe have made him an appointment with Dr. [name] of orthopedics. I think an amputation of the left leg may be discussed. I don't think there is anything that can be done with foot salvage. 09/14/18; culture from the right foot last time showed a very resistant MRSA. This is resistant to doxycycline. I'm  going to try to get linezolid at least for a week. He does not have a appointment with Dr. [name] yet. 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 I'll send a message to Dr. [name] of infectious disease. I want the patient to see Dr. [name] to go over the pros and cons of an amputation which I think will be a BKA. The patient had a question about whether this is curative or not. I told him that I thought it would be although spread of staph aureus infection is not unheard of. MRI of the right foot was done at the end of April 2019 did not show osteomyelitis. The patient last saw Dr. [name] on 9/3. I'll send Dr. [name] a message. I would really like him to weigh the pros and cons of a BKA on the left otherwise he'll probably need IV antibiotics and perhaps hyperbaric oxygen again. He has not had a good response to this in the past. His MRI earlier this month showed progressive damage in the remnants of the fourth and third metatarsal 09/21/2018; sees Dr. [name] of orthopedics next week to discuss a left BKA in response to the osteomyelitis in the left foot. Using silver alginate to all wound areas. He is completing the Zyvox I did put in for him after last culture showed MRSA 09/29/2018; patient saw Dr. [name] of orthopedics discussed the osteomyelitis in the left foot third and fourth metatarsals. He did not recommend urgent surgery but certainly stated the only surgical option would be a BKA. He is communicating with Dr. [name]. The problem here is the instability of the areas on his foot which constantly generate  draining abscesses. I will communicate with Dr. [name] about this. He has an appointment on 10/23 10/05/2018; patient sees Dr. [name] on 10/23. I have sent him a secure message not ordered any additional antibiotics for now. Patient continues to have 2 open areas on the left plantar foot at the fifth metatarsal head and the lateral aspect of the foot. These have not changed all that much. The area on the right third met head still has thick callus and surrounding subcutaneous tissue we have been using silver alginate 10/12/2018; patient sees Dr. [name] or colleague on 10/23. 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 The area on the right plantar foot is just about closed. The superior wound on the left fifth metatarsal head is callused over but I am not sure if this is closed. The area below it is about the same. Considerable amount of callus on the lateral foot. We have been using silver alginate to the wounds 10/19/2018; the patient saw Dr. [name] on 10/22. He wishes to try and continue to save the left foot. He has been given 6 weeks of oral ciprofloxacin. We continue to put a total contact cast on the right foot. The area over the fifth metatarsal head on the left is callused over/perhaps healed but I did not remove the callus to find out. He still has the wound on the left lateral midfoot requiring debridement. We have been using silver alginate to all wound areas 10/26/2018 ooOn the left foot our intake nurse noted some purulent drainage from the inferior wound [currently the only one that is not callused over] ooOn the right foot even though he is in total contact cast a considerable amount of thick black callus  and surface eschar. On this side we have been using silver alginate under a total contact cast. oohe remains on ciprofloxacin as prescribed by Dr. [name] 11/02/2018; culture from last week which is done of the  probing area on the left midfoot wound showed MRSA "few". I am going to need to contact Dr. [name] which I will do today I think he is going to need IV antibiotics again. The area on the left foot which was callused on the side is clearly separating today all of this was removed a copious amounts of callus and necrotic subcutaneous tissue. ooThe area on the right plantar foot actually remained healthy looking with a healthy granulated base. ooWe are using silver alginate to all wound areas 11/09/2018; unfortunately neither 1 of the patient's wounds areas looks at all satisfactory. On the left he has considerable necrotic debris from the plantar wound laterally over the foot. I removed copious amounts of material including callus skin and subcutaneous tissue. On the right foot he arrives with undermining and frankly purulent drainage. Specimen obtained for culture and debridement of the callused skin and subcutaneous tissue from around the circumference. Because of this I cannot put him back in a total contact cast but to be truthful we are really unfortunately not making a lot of progress I did put in a secure text message to Dr. [name] wondering about the MRSA on the left foot. He suggested vancomycin although this is not started. I was left wondering if he expected me to send this into dialysis. Has we have more purulence on the right side wait for that result before calling dialysis 11/16/2018; I called dialysis earlier this week to get the vancomycin started. I think he got the first dose on Tuesday. The patient tells me that he fell earlier this week twisting his left foot and ankle and he is swelling. He continues to not look well. He had blood cultures done at dialysis on Tuesday he is not been informed of the results. 12/07/2018; the patient was admitted to hospital from 11/22/2018 through 12/02/2018. He underwent a left BKA. Apparently had staph sepsis. In the meantime being off the  right foot this is closed over. He follows up with Dr. 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 Subsequent to this he actually had multiple falls in fact he tells me to following which calls the wound to be his apparently. He has been seeing Dr. [name] and his physician assistant in the interim. They actually did want him to come back for reevaluation to see if there's anything we can do for me wound care perspective the help this area to heal more appropriately. The patient states that he does not have a tremendous amount of pain which is good news. No fevers, chills, nausea, or vomiting noted at this time. We have gotten approval from Dr. [name] office had [place] for [place] to treat the patient for his stop wound. This is due to the fact that the patient was in the 90 day postop global. 1/21; the patient does not have an open wound on the right foot although he does have some pressure areas that will need to be padded when he is transferring. The dehisced surgical wound looks clean although there is some undermining of 1.5 cm superiorly. We have been using calcium alginate 1/28; patient arrives in our clinic for review of the left BKA stump  wound. This appears healthy but there is undermining. TheraSkin #1 2/11; TheraSkin #2 2/25; TheraSkin #3. Wound is measuring smaller 3/10; TheraSkin #4 wound is measuring smaller 3/24; TheraSkin #5 wound is measuring smaller and looks healthy. 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 He has no open area on the right foot although he does have some callused area over the original third metatarsal head wound. The third metatarsal is also subluxed on the right foot. I have warned him today that he cannot consider wearing a prosthesis for at least a month but he can go for measurements. He is going to  need to keep the right foot padded is much as possible in his diabetic shoe indefinitely READMISSION 06/12/2019 [name] is a type II diabetic on dialysis. He has been in this clinic multiple times with wounds on his bilateral lower extremities. 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 He also has a history of wounds on his right foot but tells me that everything is going well here. He actually came in here with his prosthesis walking with a cane. We were all really quite gratified to see this He states over the last several weeks he has had a painful area at the tip of the left third finger. His dialysis shunt in his is in the left upper arm and this particularly hurts during dialysis when his fingers get numb and there is a lot of pain in the tip of his left third finger. I believe he is seen vascular surgery. He had a Doppler done to evaluate for a dialysis steal syndrome. He is going for a banding procedure 1 week from today towards the left AV fistula. I think if that is unsuccessful they will be looking at creating a new shunt. He had a course of doxycycline when he which he finished about a week ago. He has been using Bactroban to the finger 6/25; the patient had his banding procedure and things seem to be going better. He is not having pain in his hand at dialysis. The area on the tip of his finger seems about closed. He did however have a paronychia I the last time I saw him. I have been advising him to use topical Bactroban washing the finger. Culture I did last time showed a few staph epidermidis which I was willing to dismiss as superficial skin contaminant. He arrives today with the finger wound looking better however the paronychia a seems worse 7/9; 2-week follow-up. He does not have an open wound on the tip of his third left finger. I thought he had an initial ischemic wound  on the tip of the finger as well as a paronychia a medially. All of this appears to be closed. 8/6-Patient returns after 3 weeks for follow-up and has a right second met head plantar callus with a significant area of maceration hiding an ulcer ABI repeated today on right - 1.26 8/13-Patient returns at 1 week, the right second met head plantar wound appears to be slightly better, it is surrounded by the callus area we are using silver alginate 8/20; the patient came back to clinic 2 weeks ago with a wound on the right second metatarsal head. He apparently told me he developed callus in this area but also went away from his custom shoes to a pair of running  shoes. Using silver alginate. 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 There might be much we can do to offload this although the patient tells me he has a wheelchair that he is using to try and stay off the wound is much as possible 8/27; right second metatarsal head. Still small punched out area with thick callus and subcutaneous tissue around the wound. We have been using silver collagen. This is always been an issue with this man's wound especially on this foot 9/3; right second met head. Still the same punched-out area with thick callus and subcutaneous tissue around the wound removing the circumference demonstrates repetitively undermining area. I have removed all the subcutaneous tissue associated with this. We have been using silver collagen 9/15; right second metatarsal head. Same punched-out thick callus and subcutaneous tissue around the wound area. Still requiring debridement we have been using silver collagen I changed back to silver alginate X-ray last time showed no evidence of osteomyelitis. Culture showed Klebsiella and he was started and Keflex apparently just 4 days ago 9/24; right second metatarsal head. He is completed the antibiotics I gave him for 10 days. Same punched-out thick callus and  subcutaneous tissue around the area. Still requiring debridement. We have been using silver alginate. The area itself looks swollen and this could be just [place] that comes on this area with walking on a prosthesis on the left however I think he needs an MRI and I am going to order that today. There is not an option to offload this further 10/1; right second plantar metatarsal head. MRI booked for October 6. Generally looking better today using silver alginate 10/8; right second plantar metatarsal head. MRI that was done on 10/6 was negative for osteomyelitis noted prior amputations of the second and fourth toes at the [facility]. Prior amputation of the third toe to the base of the middle phalanx. Prior amputation of the fifth toe to the head of the metatarsal. 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 He does not recall about hearing about this. He did not have any pain but then again he has reduced sensation from diabetic neuropathy 10/15; right second plantar metatarsal head. Still in the same condition callus nonviable subcutaneous tissue which cleans up nicely with debridement but reforms by the next week. The patient tells me that he is offloading this is much as he can including taking his wheelchair to dialysis. We have been using silver alginate, changed to silver collagen today 10/22; right second plantar metatarsal head. Wound measures smaller but still thick callus around this wound. Still requiring debridement 11/5 right second plantar metatarsal head. Less callus around the wound circumference but still requiring debridement. There is still some undermining which I cleaned out the wound looks healthy but still considerable punched out depth with a relatively small circumference to the wound there is no palpable bone no purulent drainage 11/12; right second plantar metatarsal head. Small wound with thick callus tissue around the wound and  significant undermining. We have been using silver collagen after my continuous debridements of this area Objective Constitutional Sitting or standing Blood Pressure is within target range for patient.. Pulse regular and within target range for patient.Marland Kitchen Respirations regular, non-labored and within target range.. Temperature is normal and within the target range for the patient.Marland Kitchen Appears in no distress. Vitals Time Taken: 12:48 PM, Height: 73 in, Weight: 202 lbs, BMI: 26.6, Temperature: 98.4 F, Pulse: 80  bpm, Respiratory Rate: 18 breaths/min, Blood Pressure: 136/53 mmHg, Capillary Blood Glucose: 119 mg/dl. General Notes: Wound exam; no debridement I am going to try to put endoform down underneath the skin to see if we can stimulate some granulation. Continue with unroofing of the undermining at this area does not seem to have resulted in closure. There is no evidence of surrounding infection Integumentary (Hair, Skin) Wound #41 status is Open. Original cause of wound was Gradually Appeared. The wound is located on the Right Metatarsal head second. The wound measures 0.4cm length x 0.3cm width x 0.7cm depth; 0.094cm^2 area and 0.066cm^3 volume. There is Fat Layer (Subcutaneous Tissue) Exposed exposed. There is no tunneling noted, however, there is undermining starting at 12:00 and ending at 12:00 with a maximum distance of 0.7cm. There is a small amount of serosanguineous drainage noted. The wound margin is well defined and not attached to the wound base. There is large (67-100%) red granulation within the wound bed. There is no necrotic tissue within the wound bed. 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 Dressing Change Frequency: Wound #41  Right Metatarsal head second: Change Dressing every other day. Skin Barriers/Peri-Wound Care: Barrier cream - as needed for maceration, wetness, or redness. Wound Cleansing: May shower and wash wound with soap and water. Primary Wound Dressing: Wound #41 Right Metatarsal head second: Endoform - pack into undermining as well. Secondary Dressing: Wound #41 Right Metatarsal head second: Kerlix/Rolled Gauze Dry Gauze Other: - felt to periwound and place one below wound. lightly wrap coban to hold dressing in place. felt / pad wound Edema Control: Avoid standing for long periods of time Elevate legs to the level of the heart or above for 30 minutes daily and/or when sitting, a frequency of: - throughout the day. Off-Loading: Other: - felt to periwound and place one below wound. Additional Orders / Instructions: Other: - minimize walking and standing on foot to aid in wound healing and callus buildup. 1. I use endoform packing underneath the undermining area to see if we can stimulate some granulation. 2. No evidence of underlying infection and no options for offloading this area further Electronic Signature(s) Signed: 11/08/2019 5:47:05 PM By: Linton Ham MD Entered By: Linton Ham on 11/08/2019 14:35:10 -------------------------------------------------------------------------------- SuperBill Details Patient Name: Date of Service: [name], [name] 11/08/2019 Medical Record [id number] Patient Account Number: [id number] Date of Birth/Sex: Treating RN: [birth year] (68 y.o. [name], [name] Primary Care Provider: [name] Other Clinician: Referring Provider: Treating Provider/Extender:Piya Mesch, Jeronimo Greaves, Buena Irish in Treatment: 21 Diagnosis Coding ICD-10 Codes Code Description E11.621 Type 2 diabetes mellitus with foot ulcer L97.512 Non-pressure chronic ulcer of other part of right foot with fat layer exposed I70.298 Other atherosclerosis of native  arteries of extremities, other extremity Facility Procedures The patient participates with Medicare or their insurance follows the Medicare Facility Guidelines: CPT4 Code Description Modifier Quantity [id number] [facility] VISIT-LEV 3 EST PT 1 Physician Procedures CPT4 Code Description: [id number] - WC PHYS LEVEL 2 - EST PT ICD-10 Diagnosis Description E11.621 Type 2 diabetes mellitus with foot ulcer L97.512 Non-pressure chronic ulcer of other part of right foot I70.298 Other atherosclerosis of native arteries  of extremities Modifier: with fat layer e , other extremit Quantity: 1 xposed y Engineer, maintenance) Signed: 11/08/2019 5:47:05 PM By: Dellia Nims,  Legrand Como MD Entered By: Linton Ham on 11/08/2019 14:35:36

## 2019-11-09 DIAGNOSIS — N2581 Secondary hyperparathyroidism of renal origin: Secondary | ICD-10-CM | POA: Diagnosis not present

## 2019-11-09 DIAGNOSIS — N186 End stage renal disease: Secondary | ICD-10-CM | POA: Diagnosis not present

## 2019-11-09 DIAGNOSIS — Z992 Dependence on renal dialysis: Secondary | ICD-10-CM | POA: Diagnosis not present

## 2019-11-09 DIAGNOSIS — E1121 Type 2 diabetes mellitus with diabetic nephropathy: Secondary | ICD-10-CM | POA: Diagnosis not present

## 2019-11-12 DIAGNOSIS — Z992 Dependence on renal dialysis: Secondary | ICD-10-CM | POA: Diagnosis not present

## 2019-11-12 DIAGNOSIS — E1121 Type 2 diabetes mellitus with diabetic nephropathy: Secondary | ICD-10-CM | POA: Diagnosis not present

## 2019-11-12 DIAGNOSIS — N186 End stage renal disease: Secondary | ICD-10-CM | POA: Diagnosis not present

## 2019-11-12 DIAGNOSIS — N2581 Secondary hyperparathyroidism of renal origin: Secondary | ICD-10-CM | POA: Diagnosis not present

## 2019-11-14 DIAGNOSIS — N186 End stage renal disease: Secondary | ICD-10-CM | POA: Diagnosis not present

## 2019-11-14 DIAGNOSIS — E1121 Type 2 diabetes mellitus with diabetic nephropathy: Secondary | ICD-10-CM | POA: Diagnosis not present

## 2019-11-14 DIAGNOSIS — Z992 Dependence on renal dialysis: Secondary | ICD-10-CM | POA: Diagnosis not present

## 2019-11-14 DIAGNOSIS — N2581 Secondary hyperparathyroidism of renal origin: Secondary | ICD-10-CM | POA: Diagnosis not present

## 2019-11-15 ENCOUNTER — Encounter (HOSPITAL_BASED_OUTPATIENT_CLINIC_OR_DEPARTMENT_OTHER): Payer: Medicare Other | Admitting: Internal Medicine

## 2019-11-15 ENCOUNTER — Other Ambulatory Visit (HOSPITAL_COMMUNITY)
Admission: RE | Admit: 2019-11-15 | Discharge: 2019-11-15 | Disposition: A | Discharge status: Home / Self Care | Payer: Medicare Other | Source: Other Acute Inpatient Hospital | Attending: Internal Medicine | Admitting: Internal Medicine

## 2019-11-15 ENCOUNTER — Other Ambulatory Visit: Payer: Self-pay

## 2019-11-15 DIAGNOSIS — Z89512 Acquired absence of left leg below knee: Secondary | ICD-10-CM | POA: Diagnosis not present

## 2019-11-15 DIAGNOSIS — I70298 Other atherosclerosis of native arteries of extremities, other extremity: Secondary | ICD-10-CM | POA: Diagnosis not present

## 2019-11-15 DIAGNOSIS — E11621 Type 2 diabetes mellitus with foot ulcer: Secondary | ICD-10-CM | POA: Diagnosis not present

## 2019-11-15 DIAGNOSIS — L97512 Non-pressure chronic ulcer of other part of right foot with fat layer exposed: Secondary | ICD-10-CM | POA: Diagnosis not present

## 2019-11-15 DIAGNOSIS — L089 Local infection of the skin and subcutaneous tissue, unspecified: Secondary | ICD-10-CM | POA: Diagnosis not present

## 2019-11-15 DIAGNOSIS — E1122 Type 2 diabetes mellitus with diabetic chronic kidney disease: Secondary | ICD-10-CM | POA: Diagnosis not present

## 2019-11-15 DIAGNOSIS — L03115 Cellulitis of right lower limb: Secondary | ICD-10-CM | POA: Diagnosis not present

## 2019-11-15 NOTE — Progress Notes (Signed)
KIEN, MIRSKY ([id number]) Visit Report for 11/15/2019 Debridement Details Patient Name: Date of Service: DYLANN, GALLIER 11/15/2019 1:45 PM Medical Record [id number] Patient Account Number: [id number] Date of Birth/Sex: 1951/11/06 (68 y.o. M) Treating RN: Deon Pilling Primary Care Provider: [name] Other Clinician: Referring Provider: Treating Provider/Extender:Kelleigh Skerritt, Birdena Crandall in Treatment: 22 Debridement Performed for Wound #41 Right Metatarsal head second Assessment: Performed By: Physician Ricard Dillon., MD Debridement Type: Debridement Severity of Tissue Pre Fat layer exposed Debridement: Level of Consciousness (Pre- Awake and Alert procedure): Pre-procedure Verification/Time Out Taken: Yes - 14:50 Start Time: 14:51 Pain Control: Lidocaine 4% Topical Solution Total Area Debrided (L x W): 2 (cm) x 2 (cm) = 4 (cm) Tissue and other material Non-Viable, Callus, Slough, Subcutaneous, Skin: Dermis , Fibrin/Exudate, Slough debrided: Level: Skin/Subcutaneous Tissue Debridement Description: Excisional Instrument: Blade, Forceps Specimen: Swab, Number of Specimens Taken: 1 Bleeding: Moderate Hemostasis Achieved: Pressure End Time: 14:56 Procedural Pain: 0 Post Procedural Pain: 0 Response to Treatment: Procedure was tolerated well Level of Consciousness Awake and Alert (Post-procedure): Post Debridement Measurements of Total Wound Length: (cm) 2.2 Width: (cm) 1.9 Depth: (cm) 0.3 Volume: (cm) 0.985 Character of Wound/Ulcer Post Improved Debridement: Severity of Tissue Post Debridement: Fat layer exposed Post Procedure Diagnosis Same as Pre-procedure Electronic Signature(s) Signed: 11/15/2019 6:25:25 PM By: Linton Ham MD Signed: 11/15/2019 6:38:14 PM By: Deon Pilling Entered By: Linton Ham on 11/15/2019 15:12:31 -------------------------------------------------------------------------------- HPI Details Patient  Name: Date of Service: SAYF, KERNER 11/15/2019 1:45 PM Medical Record [id number] Patient Account Number: [id number] Date of Birth/Sex: Treating RN: [birth year] (68 y.o. [name] Primary Care Provider: [name] Other Clinician: Referring Provider: Treating Provider/Extender:Adalena Abdulla, Jeronimo Greaves, Buena Irish in Treatment: 22 History of Present Illness Location: Patient presents with a wound to bilateral feet. Quality: Patient reports experiencing essentially no pain. Severity: Mildly severe wound with no evidence of infection Duration: Patient has had the wound for greater than 2 weeks prior to presenting for treatment HPI Description: The patient is a pleasant 68 yrs old bm here for evaluation of ulcers on the plantar aspect of both feet. 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 He has a left arm graft for access. He has been trying to stay off his feet for weeks but does not seem to have any improvement in the wound. He has been seeing someone at the foot center and was referred to the Wound Care center for further evaluation. 12/30/15 the patient has 3 wounds one over the right first metatarsal head and 2 on the left foot at the left fifth and lleft first metatarsal head. All of these look relatively similar. The one over the left fifth probe to bone I could not prove that any of the others did. I note his MRI in November that did not show osteomyelitis. His peripheral pulses seem robust. All of these underwent surgical debridement to remove callus nonviable skin and subcutaneous tissue 01/06/16; the patient had his sutures removed from the right fifth ray amputation. There may be a small open part of this superiorly but otherwise the incision looks good. Areas over his right first, left first and left fifth metatarsal head all underwent surgical debridement as a varying degree of callus, skin and nonviable  subcutaneous tissue. The area that is most worrisome is the right fifth metatarsal head which has a wound probes precariously close to bone. There is no purulent drainage or erythema 01/13/16; I'm not  exactly sure of the status of the right fifth ray amputation site however he follows with Dr. [name] later this week. The area over his right first plantar metatarsal head, left first and fifth plantar metatarsal head are all in the same status. Thick circumferential callus, nonviable subcutaneous tissue. Culture of the left fifth did not culture last week 01/20/16; all of the patient's wounds appear and roughly the same state although his amputation site on the right lateral foot looks better. His wounds over the right first, left first and left fifth metatarsal heads all underwent difficult surgical debridement removing circumferential callus nonviable skin and subcutaneous tissue. There is no overt evidence of infection in these areas. MRI at the end of December of the left foot did not show osteomyelitis, right foot showed osteomyelitis of the right fifth digit he is now status post amputation. 01/27/16 the patient's wounds over his plantar first and fifth metatarsal heads on the left all appear better having been started on a total contact cast last week. They were debridement of circumferential callus and nonviable subcutaneous tissue as was the wound over the first metatarsal head. His surgical incision on the right also had some light surface debridement done. 03/23/2016 -- the patient was doing really well and most of his wounds had almost completely healed but now he came back today with a history of having a discharge from the area of his right foot on the plantar aspect and also between his first and second toe. He also has had some discharge from the left foot. Addendum: I spoke to the PA [name] at the dialysis center whose fax number is [phone]. We discussed the infection  the patient has and she will put the patient on vancomycin and Fortaz until the final culture report is back. We will fax this as soon as available. 03/30/2016 -- left foot x-ray IMPRESSION:No definitive osteomyelitis noted. X-ray of the right foot -- IMPRESSION: 1. Soft tissue swelling. Prior amputation right fifth digit. No acute or focal bony abnormality identified. If osteomyelitis remains a clinical concern, MRI can be obtained. 2. Peripheral vascular disease. His culture reports have grown an MSSA -- and we will fax this report to his hemodialysis center. He was called in a prescription of oral doxycycline but I have told her not to fill this in as he is already on IV antibiotics. 04/06/2016 -- a few days ago, I spoke to the hemodialysis center nurse who had stopped the IV antibiotics and he was given a prescription for doxycycline 100 mg by mouth twice a day for a week and he is on this at the present time. 05/11/2016 -- he has recently seen his PCP this week and his hemoglobin A1c was 7. He is working on his paperwork to get his orthotic shoes. 05/25/2016 -- -- x-ray of the left foot IMPRESSION:No acute bony abnormality. No radiographic changes of acute osteomyelitis. No change since prior study. X-ray of the right foot -- IMPRESSION: Postsurgical changes are seen involving the fifth toe. No evidence of acute osteomyelitis. he developed a large blister on the medial part of his right foot and this opened out and drain fluid. 06/01/2016 -- he has his MRI to be done this afternoon. 06/15/2016 -- MRI of the left forefoot without contrast shows 2 separate regions of cutaneous and subcutaneous edema and possible ulceration and blistering along the ball of the foot. No obvious osteomyelitis identified. MRI of the right foot showed cutaneous and subcutaneous thickening plantar to the  first digit sesamoid with an ulcer crater but no underlying osteomyelitis is identified. 06/22/2016 -- the  right foot plantar ulcer has been draining a lot of seropurulent material for the last few days. 06/30/2016 -- spoke to the dialysis center and I believe I spoke to [place] a PA at the center who discussed with me and agreed to putting Legrand Como on vancomycin until his cultures arrive. On review of his culture report no WBCs were seen or no organisms were seen and the culture was reincubated for better growth. The final report is back and there were no predominant growth including Streptococcus or Staphylococcus. Clinically though he has a lot of drainage from both wounds a lot of undermining and there is further blebs on the left foot towards the interspace between his first and second toe. 08/10/2016 -- the culture from the right foot showed normal skin flora and there was no Staphylococcus aureus was group A streptococcus isolated. 09/21/2016 -- -- MRI of the right foot was done on 09/13/2016 - IMPRESSION: Findings most consistent with acute osteomyelitis throughout the great toe, sesamoid bones and plantar aspect of the head of the first metatarsal. Fluid in the sheath of the flexor tendon of the great toe could be sympathetic but is worrisome for septic tenosynovitis. First MTP joint effusion worrisome for septic joint. He was admitted to the hospital on 09/11/2016 and treated for a fever with vancomycin and cefepime. He was seen by Dr. Bobby Rumpf of infectious disease who recommended 6 weeks treatment with vancomycin and ceftazidime with his hemodialysis and to continue to see as in the wound clinic. Vascular consult was pending. The patient was discharged home on 09/14/2016 and he would continue with IV antibiotics for 6 weeks. 09/28/16 wound appears reasonably healthy. Continuing with total contact cast 10/05/16 wound is smaller and looking healthy. Continue with total contact cast. He continues on IV antibiotics 10/12/2016 -- he has developed a new wound on the dorsal aspect of his  left big toe and this is a superficial injury with no surrounding cellulitis. He has completed 30 days of IV antibiotics and is now ready to start his hyperbaric oxygen therapy as per his insurance company's recommendation. 10/19/2016 -- after the cast was removed on the right side he has got good resolution of his ulceration on the right plantar foot. he has a new wound on the left plantar foot in the region of his fourth metatarsal and this will need sharp debridement. 10/26/2016 -- Xray of the right foot complete: IMPRESSION: Changes consistent with osteomyelitis involving the head of the first metatarsal and base of the first proximal phalanx. The sesamoid bones are also likely involved given their positioning. 11/02/2016 -- there still awaiting insurance clearance for his hyperbaric oxygen therapy and hopefully he will begin treatment soon. 11/09/2016 -- started with hyperbaric oxygen therapy and had some barotrauma to the right ear and this was seen by ENT who was prescribed Afrin drops and would probably continue with HBO and he is scheduled for myringotomy tubes on Friday 11/16/2016 -- he had his myringotomy tubes placed on Friday and has been doing much better after that with some fluid draining out after hyperbaric treatment today. The pain was much better. 11/30/2016 -- over the last 2 days he noticed a swelling and change of color of his right second toe and this had been draining minimal fluid. 12/07/2016 -- x-ray of the right foot -- IMPRESSION:1. Progressive ulceration at the distal aspect of the second digit with significant  soft tissue swelling and osseous changes in the distal phalanx compatible with osteomyelitis. 2. Chronic osteomyelitis at the first MTP joint. 3. Ulcerations at the second and third toes as well without definite osseous changes. Osteomyelitis is not excluded. 4. Fifth digit amputation. On 12/01/2016 I spoke to Dr. Bobby Rumpf, the infectious disease  specialist, who kindly agreed to treat this with IV antibiotics and he would call in the order to the dialysis center and this has been discussed in detail with the patient who will make the appropriate arrangements. The patient will also book an appointment as soon as possible to see Dr. [name] in the office. Of note the patient has not been on antibiotics this entire week as the dialysis center did not receive any orders from Dr. [name]. I got in touch with Dr. [name] who tells me the patient has an appointment to see him this coming Wednesday. 12/14/2016 -- the patient is on ceftazidime and vancomycin during his dialysis, and I understand this was put on by his nephrologist Dr. Raliegh Ip possibly after speaking with infectious disease Dr. [name] 12/23/16; patient was on my schedule today for a wound evaluation as he had difficulties with his schedule earlier this week. I note that he is on vancomycin and ceftazidine at dialysis. In spite of this he arrives today with a new wound on the base of the right second toe this easily probes to bone. The known wound at the tip of the second toe With this area. He also has a superficial area on the medial aspect of the third toe on the side of the DIP. This does not appear to have much depth. The area on the plantar left foot is a deep area but did not probe the bone 12/28/16 -- he has brought in some lab work and the most recent labs done showed a hemoglobin of 12.1 hematocrit of 36.3, neutrophils of 51% WBC count of 5.6, BN of 53, albumin of 4.1 globulin of 3.7, vancomycin 13 g per mL. 01/04/2017 -- he saw Dr. [name] who has recommended a amputation of the right second toe and he is awaiting this date. He sees Dr. Bobby Rumpf of infectious disease tomorrow. The bone culture taken on 12/28/2016 had no growth in 2 days. 01/11/2017 -- was seen by Dr. Bobby Rumpf regarding the management and has recommended a eval by vascular surgeons. He recommended to  continue the antibiotics during his hemodialysis. Xray of the left foot -- IMPRESSION: No acute fracture, dislocation, or osseous erosion identified. 01/18/2017 -- he has his vascular workup later today and he is going to have his right foot second toe amputation this coming Friday by Dr. [name]. We have also put in for a another 30 treatments with hyperbaric oxygen therapy 01/25/2017 -- I have reviewed Dr. Donnetta Hutching is vascular report from last week where he reviewed him and thought his vascular function was good enough to heal his amputation site and no further tests were recommended. He also had his orthopedic related to surgery which is still pending the notes and we will review these next week. 02/01/2017 -- the operative remote of Dr. 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 02/08/2017 --he  was seen by Dr. [name] on 02/03/2017 -- patient is on [place]. after a thorough review he had recommended to continue with antibiotics during hemodialysis. The patient was seen by Dr. [name], but I do not find any follow-up note on the electronic medical record. he has removed the dressing over the right foot to remove the sutures and asked him to see him back in a month's time 02/22/2017 -- patient has been febrile and has been having symptoms of the upper respiratory tract infection but has not been checked for the flu and it's been over 4 days now. He says he is feeling better today. Some drainage between his left first and second toe and this needed to be looked at 03/08/2017 -- was seen by Dr. Bobby Rumpf on 03/07/2017 after review he  will stop his antibiotics and see him back in a month to see if he is feeling well. 03/15/2017 -- he has completed his course of hyperbaric oxygen therapy and is doing fine with his health otherwise. 03/22/2017 -- his nutritionist at the dialysis center has recommended a protein supplement to help build his collagen and we will prescribe this for him when he has the details 05/03/2017 -- he recently noticed the area on the plantar aspect of his fifth metatarsal head which opened out and had minimal drainage. 05/10/2017 -- -- x-ray of the left foot -- IMPRESSION:1. No convincing conventional radiographic evidence of active osteomyelitis.2. Active soft tissue ulceration at the tip of the fifth digit, and at the lateral aspect of the foot adjacent the base of the fifth metatarsal. 3. Surgical changes of prior fourth toe amputation. 4. 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 5. Small vessel atherosclerotic vascular calcifications. 6. 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 ======= Readmission after 5 weeks: 06/15/2017 -- the patient returns after 5 weeks having had an MRI done on 05/17/2017 MRI of the left foot without contrast showed soft tissue ulcer overlying the base of the fifth metatarsal. 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 No drainable fluid collection to suggest an abscess. He was in hospital between 05/16/2017 and 05/25/2017 -- and on discharge was asked to follow-up with Dr. [name] and Dr. [name]. He was treated 2 weeks post discharge with vancomycin plus ceftriaxone with hemodialysis and oral metronidazole 500 mg 3 times a day. 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 last hemoglobin A1c was 6.7% He had a postoperative MR of the foot on 05/23/2017 -- which showed no new  areas of cortical bone loss, edema or soft tissue ulceration to suggest osteomyelitis. the patient has completed his course of IV antibiotics during dialysis and is to see the infectious disease doctor tomorrow. Dr. [name] had seen him after suture removal and asked him to keep the wound with a dry dressing 06/21/2017 -- he was seen by Dr. Bobby Rumpf of infectious disease on 06/16/2017 -- he stopped his Flagyl and will see him back in 6 weeks. He was asked to follow-up with Dr. [name] and with the wound clinic clinic. 07/05/17; bone biopsy from last time showed acute osteomyelitis. This is from the reminiscent in the left fifth metatarsal. Culture result is apparently still pending [holding for anaerobe}. From my understanding in this case this is been a progressive necrotic wound which is deteriorated markedly over the last  3 weeks since he returned here. He now has a large area of exposed bone which was biopsied and cultured last week. Dr. Graylon Good has put him on vancomycin and Fortaz during his hemodialysis and Flagyl orally. He is to see Dr. [name] next week 07/19/2017 -- the patient was reviewed by Dr. [name] of orthopedics who reviewed the case in detail and agreed with the plan to continue with IV antibiotics, aggressive wound care and hyperbaric oxygen therapy. He would see him back in 3 weeks' time 08/09/2017 -- saw Dr. Bobby Rumpf on 08/03/2017 -he was restarted on his antibiotics for 6 weeks which included vanco, ceftaz and Flagyl. he recommended continuing his antibiotics for 6 weeks and reevaluate his completion date. He would continue with wound care and hyperbaric oxygen therapy. 08/16/2017 -- I understand he will be completing his 6 weeks of antibiotics sometime later this week. 09/19/2017 -- he has been without his wound VAC for the last week due to lack of supplies. He has been packing his wound with silver alginate. 10/04/2017 -- he has an appointment with Dr. [name]  tomorrow and hence we will not apply the wound VAC after his dressing changes today. 10/11/2017 -- he was seen by Dr. Bobby Rumpf on 10/05/2017, noted that the patient was currently off antibiotics, and after thorough review he recommended he follow-up with the wound care as per plan and no further antibiotics at this point. 11/29/17; patient is arrived for the wound on his left lateral foot.Marland Kitchen He is completed IV antibiotics and 2 rounds of hyperbaric oxygen for treatment of underlying osteomyelitis. He arrives today with a surface on most of the wound area however our intake nurse noted drainage from the superior aspect. This was brought to my attention. 12/13/2017 -- the right plantar foot had a large bleb and once the bleb was opened out a large callus and subcutaneous debris was removed and he has a plantar ulcer near the fourth metatarsal head 12/28/17 on evaluation today patient appears to be doing acutely worse in regard to his left foot. The wound which has been appearing to do better as now open up more deeply there is bone palpable at the base of the wound unfortunately. He tells me that this feels "like it did when he had osteomyelitis previously" he also noted that his second toe on the left foot appears to be doing worse and is swollen there does appear to be some fluid collected underneath. His right foot plantar ulcer appears to be doing somewhat better at this point and there really is no complication at the site currently. No fevers, chills, nausea, or vomiting noted at this time. Patient states that he normally has no pain at this site however. Today he is having significant pain. 01/03/18; culture done last week showed methicillin sensitive staph aureus and group B strep. He was on [place] however he arrives today with a fever of 101. He has a functional dialysis shunt in his left arm but has had no pain here. No cough. He still makes some urine no dysuria he did have abdominal  pain nausea and diarrhea over the weekend but is not had any diarrhea since yesterday. Otherwise he has no specific complaints 01/05/18; the patient returns today in follow-up for his presentation from 1/8. At that point he was febrile. I gave him some Levaquin adjusted for his dialysis status. He tells me the fever broke that night and it is not likely that this was actually a wound infection. He  is gone on to have an MRI of the left foot; this showed interval development of an abnormal signal from the base of the third and fourth metatarsal and in the cuboid reminiscent consistent with osteomyelitis. There is no mention of the left second toe I think which was a concern when it was ordered. The patient is taking his Levaquin 01/10/18; the patient has completed his antibiotics today. The area on the left lateral foot is smaller but it still probes easily to bone. He has underlying osteomyelitis here and sees Dr. Megan Salon of infectious disease this week The area on the right third plantar metatarsal head still requires debridement not much change in dimensions He has a new wound on the medial tip of his right third toe again this probes to bone. He only noticed this 2 days ago 01/17/18; the patient saw Dr. [name] last week and he is back on Vanco and Fortaz at dialysis. 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 We are previously seeing him for a wound also on the base of the fourth med head on the right. X- ray of this area did not show osteomyelitis in relation to a new wound on the tip of his right third toe. Again this probes to bone. Finally his second toe on the left which didn't really show anything on the MRI at least no report appears to have a separated cutaneous area which I think is going to come right off the tip of his toe and leave exposed bone. Whether this is infectious  or ischemic I am not clear Culture I did from the third toe last week which was his new wound was negative. Plain x-ray on the right foot did not show osteomyelitis in the toes 01/24/18; the patient is going went to see Dr. [name]. He is going to have a amputation of the left second and right third toe although paradoxically the left second toe looks better than last week. We have treating a deep probing wound on the left lateral foot and the area over the third metatarsal head on the right foot. 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 Also a debridement including bone of the left lateral foot and a closure which is still sutured. This is a bit surprising. Also apparent debridement of the base of the right third toe wound. 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 I'm not sure exactly where we are in the frame of things here. The wound on the left lateral foot is still sutured also the amputation sites of the left second and right third toe. He is been using Xeroform to the sutured areas silver alginate to the right foot 02/14/18; he continues on Rocephin at dialysis for osteomyelitis. I still don't have a good sense of where we are in the treatment duration. The wounds on the left lateral foot had the sutures removed and this clearly still probes deeply. We debrided the area with a #5 curet. We'll use silver alginate to the wound the area over the right third met head also required debridement of callus skin and subcutaneous tissue and necrotic debris over the wound  surface. This tunnels superiorly but I did not unroofed this today. The areas on the tip of his toes have a crusted surface eschar I did not debridement this either. 02/21/18; he continues on Rocephin at  dialysis for osteomyelitis. I don't have a good sense of where we are and the treatment duration. The wounds on the left lateral foot is callused over on presentation but requires debridement. The area on the right third med head plantar aspect actually is measuring smaller 02/28/18;patient is on Vanco and Fortaz at dialysis, I'm not sure why I thought this was Rocephin above unless it is been changed. I don't have a good sense of time frame here. Using silver collagen to the area on the left lateral foot and right third med head plantar foot 03/07/18; patient is completing bank and Fortaz at dialysis soon. He is using Silver collagen to the area on the left lateral foot and the right third metatarsal head. He has a smaller superficial wound distal to the left lateral foot wound. 03/14/18-he is here in follow-up evaluation for multiple ulcerations to his bilateral feet. He presents with a new ulceration to the plantar aspect of the left foot underneath the blister, this was deroofed to reveal a partial thickness ulcer. He is voicing no complaints or concerns, tolerated dialysis yesterday. We will continue with same treatment plan and he will follow-up next week 03/21/18; the patient has a area on the lateral left foot which still has a small probing area. The overall surface area of the wound is better. He presented with a new ulceration on the left foot plantar fifth metatarsal head last week. The area on the right third metatarsal head appears smaller.using silver alginate to all wound areas. The patient is changing the dressing himself. He is using a Darco forefoot offloading are on the right and a healing sandal on the left 03/28/18; original wound left lateral foot. Still nowhere close to looking like a heeling surface secondary wound on the left lateral foot at the base of his question fifth metatarsal which was new last week Right third metatarsal head. We have been using silver alginate all  wounds 04/04/18; the original wound on the left lateral foot again heavy callus surrounding thick nonviable subcutaneous tissue all requiring debridement. The new wound from 2 weeks ago just near this at the base of the fifth metatarsal head still looks about the same Unfortunately there is been deterioration in the third medical head wound on the right which now probes to bone. I must say this was a superficial wound at one point in time and I really don't have a good frame of reference year. I'll have to go back to look through records about what we know about the right foot. 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 He is also been to see Dr. [name]. Somewhere in here as somebody is ordered a VASCULAR evaluation which was done on 03/22/18. On the right is anterior tibial artery was monophasic triphasic at the posterior tibial artery. On the left anterior tibial artery monophasic posterior tibial artery monophasic. ABI in the right was 1.43 on the left 1.21. TBIs on the right at 0.41 and on the left at 0.32. A vascular consult was recommended and I think has been arranged. 04/11/18; Mr. Bremer has not had an MRI of the right foot since 2017. Recent x-ray of the right foot done in January  and February was negative. However he has had a major deterioration in the wound over the third met head. He is completed antibiotics last week at dialysis [place]. I think he is going to see vascular in follow-up. The areas on the left lateral foot and left plantar fifth metatarsal head both look satisfactory. I debrided both of these areas although the tissue here looks good. The area on the left lateral foot once probe to bone it certainly does not do that now 04/18/18; MRI of the right foot is on Thursday. He has what looks to be serosanguineous purulent drainage coming out of the wound over the right third  metatarsal head today. He completed antibiotics bank and Fortaz 2 weeks ago at dialysis. He has a vascular follow-up with regards to his arterial insufficiency although I don't exactly see when that is booked. The areas on the left foot including the lateral left foot and the plantar left fifth metatarsal head look about the same. 04/25/18-He is here in follow-up evaluation for bilateral foot ulcers. MRI obtained was negative for osteomyelitis. Wound culture was negative. We will continue with same treatment plan he'll follow-up next week 05/02/18; the right plantar foot wound over the third metatarsal head actually looks better than when I last saw this. His MRI was negative for osteomyelitis wound culture was negative. On the left plantar foot both wounds on the plantar fifth metatarsal head and on the lateral foot both are covered in a very hard circumferential callus. 05/09/18; right plantar foot wound over the third metatarsal head stable from last week. Left plantar foot wound over the fifth metatarsal head also stable but with callus around both wound areas The area on the left lateral foot had thick callus over a surface and I had some thoughts about leaving this intact however it felt boggy. We've been using silver alginate all wounds 05/16/18; since the patient was last here he was hospitalized from 5/15 through 5/19. 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 This grew MRSA. He was placed on vancomycin.MRI of the left foot suggested "progressive" osteomyelitis and bone destruction of the cuboid and the base of the fifth metatarsal. Stable erosive changes of the base of the second metatarsal. I'm wondering if they're aware that he had surgery and debridement in the area of the underlying bone previously by Dr. [name]. In any case, He was also revascularized with an angioplasty and  stenting of the left tibial peroneal trunk and angioplasty of the left posterior tibial artery. He is on vancomycin at dialysis. He has a 2 week follow-up with infectious disease. He has vascular surgery follow-up. He was started on Plavix. 05/23/18; the patient's wound on the lateral left foot at the level of the fifth metatarsal head and the plantar wound on the plantar fifth metatarsal head both look better. The area on the right third metatarsal head still has depth and undermining. We've been using silver alginate. He is on vancomycin. He has been revascularized on the left 05/30/18; he continues on vancomycin at dialysis. Revascularized on the left. The area on the left lateral foot is just about closed. Unfortunately the area over the plantar fifth metatarsal head undermining superiorly as does the area over the right third metatarsal head. Both of these significantly deteriorated from last week. He has appointments next week with infectious disease and orthopedics I delayed putting on  a cast on the left until those appointments which therefore we made we'll bring him in on Friday the 14th with the idea of a cast on the left foot 06/06/18; he continues on vancomycin at dialysis. Revascularized on the left. He arrives today with a ballotable swelling just above the area on the left lateral foot where his previous wound was. He has no pain but he is reasonably insensate. The difficult areas on the plantar left fifth metatarsal head looks stable whereas the area on the right third plantar metatarsal head about the same as last week there is undermining here although I did not unroofed this today. He required a considerable debridement of the swelling on the left lateral foot area and I unroofed an abscess with a copious amount of brown purulent material which I obtained for culture. I don't believe during his recent hospitalization he had any further imaging although I need to review this. Previous  cultures from this area done in this clinic showed MRSA, I would be surprised if this is not what this is currently even though he is on vancomycin. 06/13/18; he continues on vancomycin at dialysis however he finishes this on Sunday and then graduates the doxycycline previously prescribed by Dr. [name]. The abscess site on the lateral foot that I unroofed last time grew moderate amounts of methicillin-resistant staph aureus that is both vancomycin and tetracycline sensitive so we should be okay from that regard. He arrives today in clinic with a connection between the abscess site and the area on the lateral foot. We've been using silver alginate to all his wound areas. The right third metatarsal head wound is measuring smaller. Using silver alginate on both wound areas 06/20/18; transitioning to doxycycline prescribed by Dr. [name]. The abscess site on the lateral foot that I unroofed 2 weeks ago grew MRSA. That area has largely closed down although the lateral part of his wound on the fifth metatarsal head still probes to bone. I suspect all of this was connected. On the right third metatarsal head smaller looking wound but surrounded by nonviable tissue that once again requires debridement using silver alginate on both areas 06/27/18; on doxycycline 100 twice a day. The abscess on the lateral foot has closed down. Although he still has the wound on the left fifth metatarsal head that extends towards the lateral part of the foot. The most lateral part of this wound probes to bone Right third metatarsal head still a deep probing wound with undermining. Nevertheless I elected not to debridement this this week If everything is copious static next week on the left I'm going to attempt a total contact cast 07/04/18; he is tolerating his doxycycline. The abscess on the left lateral foot is closed down although there is still a deep wound here that probes to bone. We will use silver collagen under the  total contact cast Silver collagen to the deep wound over the right third metatarsal head is well 07/11/18; he is tolerating doxycycline. The left lateral fifth plantar metatarsal head still probes deeply but I could not probe any bone. We've been using silver collagen and this will be the second week under the total contact cast Silver collagen to deep wound over the right third metatarsal head as well 07/18/18; he is still taking doxycycline. The left lateral fifth plantar metatarsal this week probes deeply with a large amount of exposed bone. Quite a deterioration. He required extensive debridement. Specimens of bone for pathology and CNS obtained Also considerable debridement  on the right third metatarsal head 07/25/18; bone for pathology last week from the lateral left foot showed osteomyelitis. CandS of the bone Enterobacter. And Klebsiella. He is now on ciprofloxacin and the doxycycline is stopped [Dr. [name] of infectious disease] area He has an appointment with Dr. [name] tomorrow I'm going to leave the total contact cast off for this week. May wish to try to reapply that next week or the week after depending on the wound bed looks. I'm not sure if there is an operative option here, previously is followed with Dr. [name] 08/01/18 on evaluation today patient presents for reevaluation. He has been seen by infectious disease, Dr. [name], and he has placed the patient back on Ceftaz currently to be given to him at dialysis three times a week. 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 Fortunately there does not appear to be the evidence of infection at this point in time. Overall the patient has been tolerating the dressing changes without complication. We are no longer due lives in the cast of left foot secondary to the infection obviously. 08/10/18 on evaluation today patient appears to be doing rather well all things  considered in regard to his left plantar foot ulcer. He does have a significant callous on the lateral portion of his left foot he wonders if I can help clean that away to some degree today. Fortunately he is having no evidence of infection at this time which is good news. No fevers chills noted. With that being said his right plantar foot actually does have a significant callous buildup around the wound opening this seems to not be doing as well as I would like at this point. I do believe that he would benefit from sharp debridement at this site. Subsequently I think a total contact cast would be helpful for the right foot as well. 08/17/18; the patient arrived today with a total contact cast on the right foot. This actually looks quite good. He had gone to see Dr. Megan Salon of infectious disease about the osteomyelitis on the left foot. I think he is on IV Fortaz at dialysis although I am not exactly sure of the rationale for the Fortaz at the time of this dictation. He also arrived in today with a swelling on the lateral left foot this is the site of his original wounds in fact when I first saw this man when he was under Dr. Ardeen Garland care I think this was the site of where the wound was located. It was not particularly tender however using a small scalpel I opened it to [place] some moderate amount of purulent drainage. 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 Last MRI was in May that showed progressive osteomyelitis and bone destruction of the cuboid and base of the fifth metatarsal. Last week he had an abscess over this same area this was removed. Culture was negative. We are continuing with a total contact cast to the area on the third metatarsal head on the right and making some good progress here. The patient asked me again about renal transplant. He is not on the  list because of the open wounds. 08/31/18; apparently the patient had an interruption in the [place] but he is now back on this at dialysis. Apparently there was a misunderstanding and Dr.  Snyder's orders. Due to have the MRI of the left foot tonight. The area on the right foot continues to have callus thick skin and subcutaneous tissue around the wound edge that requires constant debridement however the wound is smaller we are using a total contact cast in this area. Alginate all wounds 09/07/18; without much surprise the MRI of his left foot showed osteomyelitis in the reminiscent of his fourth metatarsal but also the third metatarsal. She arrives in clinic today with the right foot and a total contact cast. There was purulent drainage coming out of the wound which I have cultured. Marked deterioration here with undermining widely around the wound orifice. Using pickups and a scalpel I remove callus and subcutaneous tissue from a substantial new opening. In a similar fashion the area on the left lateral foot that was blistered last week I have opened this and remove skin and subcutaneous tissue from this area to expose a obvious new wound. I think there is extension and communication between all of this on the left foot. The patient is on Fortaz at dialysis. Culture done of the right foot. We are clearly not making progress here. We have made him an appointment with Dr. [name] of orthopedics. I think an amputation of the left leg may be discussed. I don't think there is anything that can be done with foot salvage. 09/14/18; culture from the right foot last time showed a very resistant MRSA. This is resistant to doxycycline. I'm going to try to get linezolid at least for a week. He does not have a appointment with Dr. [name] yet. 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 I'll send a message to Dr. [name] of infectious disease. I want the  patient to see Dr. [name] to go over the pros and cons of an amputation which I think will be a BKA. The patient had a question about whether this is curative or not. I told him that I thought it would be although spread of staph aureus infection is not unheard of. MRI of the right foot was done at the end of April 2019 did not show osteomyelitis. The patient last saw Dr. [name] on 9/3. I'll send Dr. [name] a message. I would really like him to weigh the pros and cons of a BKA on the left otherwise he'll probably need IV antibiotics and perhaps hyperbaric oxygen again. He has not had a good response to this in the past. His MRI earlier this month showed progressive damage in the remnants of the fourth and third metatarsal 09/21/2018; sees Dr. [name] of orthopedics next week to discuss a left BKA in response to the osteomyelitis in the left foot. Using silver alginate to all wound areas. He is completing the Zyvox I did put in for him after last culture showed MRSA 09/29/2018; patient saw Dr. [name] of orthopedics discussed the osteomyelitis in the left foot third and fourth metatarsals. He did not recommend urgent surgery but certainly stated the only surgical option would be a BKA. He is communicating with Dr. [name]. The problem here is the instability of the areas on his foot which constantly generate draining abscesses. I will communicate with Dr. [name] about this. He has an appointment on 10/23 10/05/2018; patient sees Dr. [name] on 10/23. I have sent him a secure message not ordered any additional antibiotics for now. Patient continues to have 2 open areas on the left plantar foot at  the fifth metatarsal head and the lateral aspect of the foot. These have not changed all that much. The area on the right third met head still has thick callus and surrounding subcutaneous tissue we have been using silver alginate 10/12/2018; patient sees Dr. [name] or colleague on 10/23. 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 The area on the right plantar foot is just about closed. The superior wound on the left fifth metatarsal head is callused over but I am not sure if this is closed. The area below it is about the same. Considerable amount of callus on the lateral foot. We have been using silver alginate to the wounds 10/19/2018; the patient saw Dr. [name] on 10/22. He wishes to try and continue to save the left foot. He has been given 6 weeks of oral ciprofloxacin. We continue to put a total contact cast on the right foot. The area over the fifth metatarsal head on the left is callused over/perhaps healed but I did not remove the callus to find out. He still has the wound on the left lateral midfoot requiring debridement. We have been using silver alginate to all wound areas 10/26/2018 On the left foot our intake nurse noted some purulent drainage from the inferior wound [currently the only one that is not callused over] On the right foot even though he is in total contact cast a considerable amount of thick black callus and surface eschar. On this side we have been using silver alginate under a total contact cast. he remains on ciprofloxacin as prescribed by Dr. [name] 11/02/2018; culture from last week which is done of the probing area on the left midfoot wound showed MRSA "few". I am going to need to contact Dr. [name] which I will do today I think he is going to need IV antibiotics again. The area on the left foot which was callused on the side is clearly separating today all of this was removed a copious amounts of callus and necrotic subcutaneous tissue. The area on the right plantar foot actually remained healthy looking with a healthy granulated base. We are using silver alginate to all wound areas 11/09/2018; unfortunately neither 1 of the patient's wounds areas looks at all satisfactory. On the left he  has considerable necrotic debris from the plantar wound laterally over the foot. I removed copious amounts of material including callus skin and subcutaneous tissue. On the right foot he arrives with undermining and frankly purulent drainage. Specimen obtained for culture and debridement of the callused skin and subcutaneous tissue from around the circumference. Because of this I cannot put him back in a total contact cast but to be truthful we are really unfortunately not making a lot of progress I did put in a secure text message to Dr. [name] wondering about the MRSA on the left foot. He suggested vancomycin although this is not started. I was left wondering if he expected me to send this into dialysis. Has we have more purulence on the right side wait for that result before calling dialysis 11/16/2018; I called dialysis earlier this week to get the vancomycin started. I think he got the first dose on Tuesday. The patient tells me that he fell earlier this week twisting his left foot and ankle and he is swelling. He continues to not look well. He had blood cultures done at dialysis on Tuesday he is not been informed of the results. 12/07/2018; the  patient was admitted to hospital from 11/22/2018 through 12/02/2018. He underwent a left BKA. Apparently had staph sepsis. In the meantime being off the right foot this is closed over. He follows up with Dr. 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 Subsequent to this he actually had multiple falls in fact he tells me to following which calls the wound to be his apparently. He has been seeing Dr. [name] and his physician assistant in the interim. They actually did want him to come back for reevaluation to see if there's anything we can do for me wound care perspective the help this area to heal more appropriately. The patient states that  he does not have a tremendous amount of pain which is good news. No fevers, chills, nausea, or vomiting noted at this time. We have gotten approval from Dr. [name] office had [facility] for [place] to treat the patient for his stop wound. This is due to the fact that the patient was in the 90 day postop global. 1/21; the patient does not have an open wound on the right foot although he does have some pressure areas that will need to be padded when he is transferring. The dehisced surgical wound looks clean although there is some undermining of 1.5 cm superiorly. 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 This appears healthy but there is undermining. TheraSkin #1 2/11; TheraSkin #2 2/25; TheraSkin #3. Wound is measuring smaller 3/10; TheraSkin #4 wound is measuring smaller 3/24; TheraSkin #5 wound is measuring smaller and looks healthy. 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 He has no open area on the right foot although he does have some callused area over the original third metatarsal head wound. The third metatarsal is also subluxed on the right foot. I have warned him today that he cannot consider wearing a prosthesis for at least a month but he can go for measurements. He is going to need to keep the right foot padded is much as possible in his diabetic shoe indefinitely READMISSION 06/12/2019 Mr. Attaway is a type II diabetic on dialysis. He has been in this clinic multiple times with wounds on his bilateral lower extremities. 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 He also has a history of wounds on his right foot but tells me that everything is going well here. He actually came in here with his prosthesis walking with a cane. We were all really quite gratified to see this He  states over the last several weeks he has had a painful area at the tip of the left third finger. His dialysis shunt in his is in the left upper arm and this particularly hurts during dialysis when his fingers get numb and there is a lot of pain in the tip of his left third finger. I believe he is seen vascular surgery. He had a Doppler done to evaluate for a dialysis steal syndrome. He is going for a banding procedure 1 week from today towards the left AV fistula. I think if that is unsuccessful they will be looking at creating a new shunt. He had a course of doxycycline when he which he finished about a  week ago. He has been using Bactroban to the finger 6/25; the patient had his banding procedure and things seem to be going better. He is not having pain in his hand at dialysis. The area on the tip of his finger seems about closed. He did however have a paronychia I the last time I saw him. I have been advising him to use topical Bactroban washing the finger. Culture I did last time showed a few staph epidermidis which I was willing to dismiss as superficial skin contaminant. He arrives today with the finger wound looking better however the paronychia a seems worse 7/9; 2-week follow-up. He does not have an open wound on the tip of his third left finger. I thought he had an initial ischemic wound on the tip of the finger as well as a paronychia a medially. All of this appears to be closed. 8/6-Patient returns after 3 weeks for follow-up and has a right second met head plantar callus with a significant area of maceration hiding an ulcer ABI repeated today on right - 1.26 8/13-Patient returns at 1 week, the right second met head plantar wound appears to be slightly better, it is surrounded by the callus area we are using silver alginate 8/20; the patient came back to clinic 2 weeks ago with a wound on the right second metatarsal head. He apparently told me he developed callus in this area but also  went away from his custom shoes to a pair of running shoes. Using silver alginate. 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 There might be much we can do to offload this although the patient tells me he has a wheelchair that he is using to try and stay off the wound is much as possible 8/27; right second metatarsal head. Still small punched out area with thick callus and subcutaneous tissue around the wound. We have been using silver collagen. This is always been an issue with this man's wound especially on this foot 9/3; right second met head. Still the same punched-out area with thick callus and subcutaneous tissue around the wound removing the circumference demonstrates repetitively undermining area. I have removed all the subcutaneous tissue associated with this. We have been using silver collagen 9/15; right second metatarsal head. Same punched-out thick callus and subcutaneous tissue around the wound area. Still requiring debridement we have been using silver collagen I changed back to silver alginate X-ray last time showed no evidence of osteomyelitis. Culture showed Klebsiella and he was started and Keflex apparently just 4 days ago 9/24; right second metatarsal head. He is completed the antibiotics I gave him for 10 days. Same punched-out thick callus and subcutaneous tissue around the area. Still requiring debridement. We have been using silver alginate. The area itself looks swollen and this could be just Laforce that comes on this area with walking on a prosthesis on the left however I think he needs an MRI and I am going to order that today. There is not an option to offload this further 10/1; right second plantar metatarsal head. MRI booked for October 6. Generally looking better today using silver alginate 10/8; right second plantar metatarsal head. MRI that was done on 10/6 was negative for osteomyelitis noted prior amputations of the second and fourth toes  at the MTP. Prior amputation of the third toe to the base of the middle phalanx. Prior amputation of the fifth toe to the head of the metatarsal. 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 He does not recall about hearing about this. He did not have any pain but then again he has reduced sensation from diabetic neuropathy 10/15; right second plantar metatarsal head. Still in the same condition callus nonviable subcutaneous tissue which cleans up nicely with debridement but reforms by the next week. The patient tells me that he is offloading this is much as he can including taking his wheelchair to dialysis. We have been using silver alginate, changed to silver collagen today 10/22; right second plantar metatarsal head. Wound measures smaller but still thick callus around this wound. Still requiring debridement 11/5 right second plantar metatarsal head. Less callus around the wound circumference but still requiring debridement. There is still some undermining which I cleaned out the wound looks healthy but still considerable punched out depth with a relatively small circumference to the wound there is no palpable bone no purulent drainage 11/12; right second plantar metatarsal head. Small wound with thick callus tissue around the wound and significant undermining. We have been using silver collagen after my continuous debridements of this area 11/19; patient arrives in today with purulent drainage coming out of the wound in the second third met head area on the right foot. Serosanguineous thick drainage. Specimen obtained for culture. Electronic Signature(s) Signed: 11/15/2019 6:25:25 PM By: Linton Ham MD Entered By: Linton Ham on 11/15/2019 15:13:08 -------------------------------------------------------------------------------- Physical Exam Details Patient Name: Date of Service: KAYVAN, HOEFLING 11/15/2019 1:45 PM Medical Record  [id number] Patient Account Number: [id number] Date of Birth/Sex: Treating RN: October 29, 1951 (68 y.o. [name] Primary Care Provider: [name] Other Clinician: Referring Provider: Treating Provider/Extender:Trevionne Advani, Jeronimo Greaves, Buena Irish in Treatment: 22 Constitutional Sitting or standing Blood Pressure is within target range for patient.. Pulse regular and within target range for patient.Marland Kitchen Respirations regular, non-labored and within target range.. Temperature is normal and within the target range for the patient.Marland Kitchen Appears in no distress. Notes Wound exam; extensive debridement with pickups and a #15 blade. Removed callus necrotic subcutaneous tissue down to a viable looking wound bed. Fortunately no evidence of spreading infection in the foot but certainly unroofed the abscess. Electronic Signature(s) Signed: 11/15/2019 6:25:25 PM By: Linton Ham MD Entered By: Linton Ham on 11/15/2019 15:13:53 -------------------------------------------------------------------------------- Physician Orders Details Patient Name: Date of Service: OSWELL, SAY 11/15/2019 1:45 PM Medical Record [id number] Patient Account Number: [id number] Date of Birth/Sex: Treating RN: 1951-08-04 (68 y.o. 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 - to see hoyt. Dressing Change Frequency Wound #41 Right Metatarsal head second Change Dressing every other day. Skin Barriers/Peri-Wound Care Barrier cream -  as needed for maceration, wetness, or redness. Wound Cleansing May shower and wash wound with soap and water. Primary Wound Dressing Wound #41 Right Metatarsal head second Calcium Alginate with Silver Secondary Dressing Wound #41 Right Metatarsal head second Kerlix/Rolled Gauze Dry Gauze Foam Border - patient may use bordered foam to cut into a felt to offload. Other: - felt to periwound and place one below wound. lightly wrap coban to hold dressing in place. felt / pad wound Edema Control Avoid standing for  long periods of time Elevate legs to the level of the heart or above for 30 minutes daily and/or when sitting, a frequency of: - throughout the day. Off-Loading Other: - felt to periwound and place one below wound. Additional Orders / Instructions Other: - minimize walking and standing on foot to aid in wound healing and callus buildup. Laboratory Bacteria identified in Unspecified specimen by Aerobe culture (MICRO) - culture of right foot wound. - (ICD10 E11.621 - Type 2 diabetes mellitus with foot ulcer) LOINC Code: 161-0 Convenience Name: Areobic culture-specimen not specified Patient Medications Allergies: penicillin Notifications Medication Indication Start End cephalexin right foot 11/15/2019 infection DOSE oral 500 mg capsule - 1capsule oral bid with dose after dialysis on dialysis days for 7 days Electronic Signature(s) Signed: 11/15/2019 3:11:35 PM By: Linton Ham MD Entered By: Linton Ham on 11/15/2019 15:11:33 -------------------------------------------------------------------------------- Problem List Details Patient Name: Date of Service: ASAHEL, RISDEN 11/15/2019 1:45 PM Medical Record [id number] Patient Account Number: [id number] Date of Birth/Sex: Treating RN: 18-Jun-1951 (68 y.o. 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, 06/12/2019 No Yes other extremity L03.115 Cellulitis of right lower limb 11/15/2019 No Yes Inactive Problems ICD-10 Code Description Active Date Inactive Date L03.114 Cellulitis of left upper limb 06/12/2019 06/12/2019 L98.498 Non-pressure chronic ulcer of skin of other sites with other 06/12/2019 06/12/2019 specified severity S61.209D Unspecified open wound of unspecified finger without damage 06/12/2019 06/12/2019 to nail, subsequent encounter Resolved Problems Electronic Signature(s) Signed: 11/15/2019 6:25:25 PM By: Linton Ham MD Entered By: Linton Ham on 11/15/2019 15:12:08 -------------------------------------------------------------------------------- Progress Note Details Patient Name: Date of Service: ALBIE, ARIZPE 11/15/2019 1:45 PM Medical Record [id number] Patient Account Number: [id number] Date of Birth/Sex: Treating RN: 08/09/1951 (68 y.o. [name] Primary Care Provider: [name] Other Clinician: Referring Provider: Treating Provider/Extender:Ndea Kilroy, Jeronimo Greaves, Buena Irish in Treatment: 22 Subjective History of Present Illness (HPI) The following HPI elements were documented for the patient's wound: Location: Patient presents with a wound to bilateral feet. Quality: Patient reports experiencing essentially no pain. Severity: Mildly severe wound with no evidence of infection Duration: Patient has had the wound for greater than 2 weeks prior to presenting for treatment The patient is a pleasant 68 yrs old bm here for evaluation of ulcers on the plantar aspect of both feet. He has DM, heart disease,  chronic kidney disease, long history of ulcers and is on hemodialysis. He has a left arm graft for access. He has been trying to stay off his feet for weeks but does not seem to have any improvement in the wound. He has been seeing someone at the foot center and was referred to the Wound Care center for further evaluation. 12/30/15 the patient has 3 wounds one over the right first metatarsal head and 2 on the left foot at the left fifth and lleft first metatarsal head. All of these look relatively similar. The one over the left fifth probe to bone I could not prove that any of the others did. I note his MRI in November that did not show osteomyelitis. His peripheral pulses seem robust. All of these underwent surgical debridement to remove callus nonviable skin  and subcutaneous tissue 01/06/16; the patient had his sutures removed from the right fifth ray amputation. There may be a small open part of this superiorly but otherwise the incision looks good. Areas over his right first, left first and left fifth metatarsal head all underwent surgical debridement as a varying degree of callus, skin and nonviable subcutaneous tissue. The area that is most worrisome is the right fifth metatarsal head which has a wound probes precariously close to bone. There is no purulent drainage or erythema 01/13/16; I'm not exactly sure of the status of the right fifth ray amputation site however he follows with Dr. [name] later this week. The area over his right first plantar metatarsal head, left first and fifth plantar metatarsal head are all in the same status. Thick circumferential callus, nonviable subcutaneous tissue. Culture of the left fifth did not culture last week 01/20/16; all of the patient's wounds appear and roughly the same state although his amputation site on the right lateral foot looks better. His wounds over the right first, left first and left fifth metatarsal heads all underwent difficult surgical  debridement removing circumferential callus nonviable skin and subcutaneous tissue. There is no overt evidence of infection in these areas. MRI at the end of December of the left foot did not show osteomyelitis, right foot showed osteomyelitis of the right fifth digit he is now status post amputation. 01/27/16 the patient's wounds over his plantar first and fifth metatarsal heads on the left all appear better having been started on a total contact cast last week. They were debridement of circumferential callus and nonviable subcutaneous tissue as was the wound over the first metatarsal head. His surgical incision on the right also had some light surface debridement done. 03/23/2016 -- the patient was doing really well and most of his wounds had almost completely healed but now he came back today with a history of having a discharge from the area of his right foot on the plantar aspect and also between his first and second toe. He also has had some discharge from the left foot. Addendum: I spoke to the PA [name] at the dialysis center whose fax number is [phone]. We discussed the infection the patient has and she will put the patient on vancomycin and Fortaz until the final culture report is back. We will fax this as soon as available. 03/30/2016 -- left foot x-ray IMPRESSION:No definitive osteomyelitis noted. X-ray of the right foot -- IMPRESSION: 1. Soft tissue swelling. Prior amputation right fifth digit. No acute or focal bony abnormality identified. If osteomyelitis remains a clinical concern, MRI can be obtained. 2. Peripheral vascular disease. His culture reports have grown an MSSA -- and we will fax this report to his hemodialysis center. He was called in a prescription of oral doxycycline but I have told her not to fill this in as he is already on IV antibiotics. 04/06/2016 -- a few days ago, I spoke to the hemodialysis center nurse who had stopped the IV antibiotics and  he was given a prescription for doxycycline 100 mg by mouth twice a day for a week and he is on this at the present time. 05/11/2016 -- he has recently seen his PCP this week and his hemoglobin A1c was 7. He is working on his paperwork to get his orthotic shoes. 05/25/2016 -- -- x-ray of the left foot IMPRESSION:No acute bony abnormality. No radiographic changes of acute osteomyelitis. No change since prior study. X-ray of the right foot --  IMPRESSION: Postsurgical changes are seen involving the fifth toe. No evidence of acute osteomyelitis. he developed a large blister on the medial part of his right foot and this opened out and drain fluid. 06/01/2016 -- he has his MRI to be done this afternoon. 06/15/2016 -- MRI of the left forefoot without contrast shows 2 separate regions of cutaneous and subcutaneous edema and possible ulceration and blistering along the ball of the foot. No obvious osteomyelitis identified. MRI of the right foot showed cutaneous and subcutaneous thickening plantar to the first digit sesamoid with an ulcer crater but no underlying osteomyelitis is identified. 06/22/2016 -- the right foot plantar ulcer has been draining a lot of seropurulent material for the last few days. 06/30/2016 -- spoke to the dialysis center and I believe I spoke to Ojus a PA at the center who discussed with me and agreed to putting Legrand Como on vancomycin until his cultures arrive. On review of his culture report no WBCs were seen or no organisms were seen and the culture was reincubated for better growth. The final report is back and there were no predominant growth including Streptococcus or Staphylococcus. Clinically though he has a lot of drainage from both wounds a lot of undermining and there is further blebs on the left foot towards the interspace between his first and second toe. 08/10/2016 -- the culture from the right foot showed normal skin flora and there was no Staphylococcus aureus  was group A streptococcus isolated. 09/21/2016 -- -- MRI of the right foot was done on 09/13/2016 - IMPRESSION: Findings most consistent with acute osteomyelitis throughout the great toe, sesamoid bones and plantar aspect of the head of the first metatarsal. Fluid in the sheath of the flexor tendon of the great toe could be sympathetic but is worrisome for septic tenosynovitis. First MTP joint effusion worrisome for septic joint. He was admitted to the hospital on 09/11/2016 and treated for a fever with vancomycin and cefepime. He was seen by Dr. Bobby Rumpf of infectious disease who recommended 6 weeks treatment with vancomycin and ceftazidime with his hemodialysis and to continue to see as in the wound clinic. Vascular consult was pending. The patient was discharged home on 09/14/2016 and he would continue with IV antibiotics for 6 weeks. 09/28/16 wound appears reasonably healthy. Continuing with total contact cast 10/05/16 wound is smaller and looking healthy. Continue with total contact cast. He continues on IV antibiotics 10/12/2016 -- he has developed a new wound on the dorsal aspect of his left big toe and this is a superficial injury with no surrounding cellulitis. He has completed 30 days of IV antibiotics and is now ready to start his hyperbaric oxygen therapy as per his insurance company's recommendation. 10/19/2016 -- after the cast was removed on the right side he has got good resolution of his ulceration on the right plantar foot. he has a new wound on the left plantar foot in the region of his fourth metatarsal and this will need sharp debridement. 10/26/2016 -- Xray of the right foot complete: IMPRESSION: Changes consistent with osteomyelitis involving the head of the first metatarsal and base of the first proximal phalanx. The sesamoid bones are also likely involved given their positioning. 11/02/2016 -- there still awaiting insurance clearance for his hyperbaric oxygen  therapy and hopefully he will begin treatment soon. 11/09/2016 -- started with hyperbaric oxygen therapy and had some barotrauma to the right ear and this was seen by ENT who was prescribed Afrin drops and would probably continue  with HBO and he is scheduled for myringotomy tubes on Friday 11/16/2016 -- he had his myringotomy tubes placed on Friday and has been doing much better after that with some fluid draining out after hyperbaric treatment today. The pain was much better. 11/30/2016 -- over the last 2 days he noticed a swelling and change of color of his right second toe and this had been draining minimal fluid. 12/07/2016 -- x-ray of the right foot -- IMPRESSION:1. Progressive ulceration at the distal aspect of the second digit with significant soft tissue swelling and osseous changes in the distal phalanx compatible with osteomyelitis. 2. Chronic osteomyelitis at the first MTP joint. 3. Ulcerations at the second and third toes as well without definite osseous changes. Osteomyelitis is not excluded. 4. Fifth digit amputation. On 12/01/2016 oo I spoke to Dr. Bobby Rumpf, the infectious disease specialist, who kindly agreed to treat this with IV antibiotics and he would call in the order to the dialysis center and this has been discussed in detail with the patient who will make the appropriate arrangements. The patient will also book an appointment as soon as possible to see Dr. [name] in the office. Of note the patient has not been on antibiotics this entire week as the dialysis center did not receive any orders from Dr. [name]. I got in touch with Dr. [name] who tells me the patient has an appointment to see him this coming Wednesday. 12/14/2016 -- the patient is on ceftazidime and vancomycin during his dialysis, and I understand this was put on by his nephrologist Dr. Raliegh Ip possibly after speaking with infectious disease Dr. [name] 12/23/16; patient was on my schedule today for a  wound evaluation as he had difficulties with his schedule earlier this week. I note that he is on vancomycin and ceftazidine at dialysis. In spite of this he arrives today with a new wound on the base of the right second toe this easily probes to bone. The known wound at the tip of the second toe With this area. He also has a superficial area on the medial aspect of the third toe on the side of the DIP. This does not appear to have much depth. The area on the plantar left foot is a deep area but did not probe the bone 12/28/16 -- he has brought in some lab work and the most recent labs done showed a hemoglobin of 12.1 hematocrit of 36.3, neutrophils of 51% WBC count of 5.6, BN of 53, albumin of 4.1 globulin of 3.7, vancomycin 13 g per mL. 01/04/2017 -- he saw Dr. [name] who has recommended a amputation of the right second toe and he is awaiting this date. He sees Dr. Bobby Rumpf of infectious disease tomorrow. The bone culture taken on 12/28/2016 had no growth in 2 days. 01/11/2017 -- was seen by Dr. Bobby Rumpf regarding the management and has recommended a eval by vascular surgeons. He recommended to continue the antibiotics during his hemodialysis. Xray of the left foot -- IMPRESSION: No acute fracture, dislocation, or osseous erosion identified. 01/18/2017 -- he has his vascular workup later today and he is going to have his right foot second toe amputation this coming Friday by Dr. [name]. We have also put in for a another 30 treatments with hyperbaric oxygen therapy 01/25/2017 -- I have reviewed Dr. Donnetta Hutching is vascular report from last week where he reviewed him and thought his vascular function was good enough to heal his amputation site and no further tests were recommended.  He also had his orthopedic related to surgery which is still pending the notes and we will review these next week. 02/01/2017 -- the operative remote of Dr. 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 02/08/2017 --he was seen by Dr. [name] on 02/03/2017 -- patient is on [place]. after a thorough review he had recommended to continue with antibiotics during hemodialysis. The patient was seen by Dr. [name], but I do not find any follow-up note on the electronic medical record. he has removed the dressing over the right foot to remove the sutures and asked him to see him back in a month's time 02/22/2017 -- patient has been febrile and has been having symptoms of the upper respiratory tract infection but has not been checked for the flu and it's been over 4 days now. He says he is feeling better today. Some drainage between his left first and second toe and this needed to be looked at 03/08/2017 -- was seen by Dr. Bobby Rumpf on 03/07/2017 after review he will stop his antibiotics and see him back in a month to see if he is feeling well. 03/15/2017 -- he has completed his course of hyperbaric oxygen therapy and is doing fine with his health otherwise. 03/22/2017 -- his nutritionist at the dialysis center has recommended a protein supplement to help build his collagen and we will prescribe this for him when he has the details 05/03/2017 -- he recently noticed the area on the plantar aspect of his fifth metatarsal head which opened out and had minimal drainage. 05/10/2017 -- -- x-ray of the left foot -- IMPRESSION:1. No convincing conventional radiographic evidence of active osteomyelitis.2. Active soft tissue ulceration at the tip of the fifth digit, and at the lateral aspect of the foot adjacent the base of the fifth metatarsal. 3.  Surgical changes of prior fourth toe amputation. 4. 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 5. Small vessel atherosclerotic vascular calcifications. 6. 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 ======= Readmission after 5 weeks: 06/15/2017 -- the patient returns after 5 weeks having had an MRI done on 05/17/2017 MRI of the left foot without contrast showed soft tissue ulcer overlying the base of the fifth metatarsal. 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 No drainable fluid collection to suggest an abscess. He was in hospital between 05/16/2017 and 05/25/2017 -- and on discharge was asked to follow-up with Dr. [name] and Dr. [name]. He was treated 2 weeks post discharge with vancomycin plus ceftriaxone with hemodialysis and oral metronidazole 500 mg 3 times a day. 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 last hemoglobin A1c was 6.7% He had a postoperative MR of the foot on 05/23/2017 -- which showed no new areas of cortical bone loss, edema or soft tissue ulceration to suggest osteomyelitis. the patient has completed his course of IV antibiotics during dialysis and is to see the infectious disease doctor tomorrow. Dr. [name] had seen him after suture removal and asked him to keep the wound with a  dry dressing 06/21/2017 -- he was seen by Dr. Bobby Rumpf of infectious disease on 06/16/2017 -- he stopped his Flagyl and will see him back in 6 weeks. He was asked to follow-up with Dr. [name] and with the wound clinic clinic. 07/05/17; bone biopsy from last time showed acute osteomyelitis. This is from the reminiscent in the left fifth metatarsal. Culture result is apparently still pending [holding for anaerobe}. From my understanding in this case this is been a progressive necrotic  wound which is deteriorated markedly over the last 3 weeks since he returned here. He now has a large area of exposed bone which was biopsied and cultured last week. Dr. Graylon Good has put him on vancomycin and Fortaz during his hemodialysis and Flagyl orally. He is to see Dr. [name] next week 07/19/2017 -- the patient was reviewed by Dr. [name] of orthopedics who reviewed the case in detail and agreed with the plan to continue with IV antibiotics, aggressive wound care and hyperbaric oxygen therapy. He would see him back in 3 weeks' time 08/09/2017 -- saw Dr. Bobby Rumpf on 08/03/2017 -he was restarted on his antibiotics for 6 weeks which included vanco, ceftaz and Flagyl. he recommended continuing his antibiotics for 6 weeks and reevaluate his completion date. He would continue with wound care and hyperbaric oxygen therapy. 08/16/2017 -- I understand he will be completing his 6 weeks of antibiotics sometime later this week. 09/19/2017 -- he has been without his wound VAC for the last week due to lack of supplies. He has been packing his wound with silver alginate. 10/04/2017 -- he has an appointment with Dr. [name] tomorrow and hence we will not apply the wound VAC after his dressing changes today. 10/11/2017 -- he was seen by Dr. Bobby Rumpf on 10/05/2017, noted that the patient was currently off antibiotics, and after thorough review he recommended he follow-up with the wound care as per plan and no further antibiotics at this point. 11/29/17; patient is arrived for the wound on his left lateral foot.Marland Kitchen He is completed IV antibiotics and 2 rounds of hyperbaric oxygen for treatment of underlying osteomyelitis. He arrives today with a surface on most of the wound area however our intake nurse noted drainage from the superior aspect. This was brought to my attention. 12/13/2017 -- the right plantar foot had a large bleb and once the bleb was opened out a large callus and subcutaneous debris  was removed and he has a plantar ulcer near the fourth metatarsal head 12/28/17 on evaluation today patient appears to be doing acutely worse in regard to his left foot. The wound which has been appearing to do better as now open up more deeply there is bone palpable at the base of the wound unfortunately. He tells me that this feels "like it did when he had osteomyelitis previously" he also noted that his second toe on the left foot appears to be doing worse and is swollen there does appear to be some fluid collected underneath. His right foot plantar ulcer appears to be doing somewhat better at this point and there really is no complication at the site currently. No fevers, chills, nausea, or vomiting noted at this time. Patient states that he normally has no pain at this site however. Today he is having significant pain. 01/03/18; culture done last week showed methicillin sensitive staph aureus and group B strep. He was on [place] however he arrives today with a fever of 101. He has a functional dialysis shunt in his  left arm but has had no pain here. No cough. He still makes some urine no dysuria he did have abdominal pain nausea and diarrhea over the weekend but is not had any diarrhea since yesterday. Otherwise he has no specific complaints 01/05/18; the patient returns today in follow-up for his presentation from 1/8. At that point he was febrile. I gave him some Levaquin adjusted for his dialysis status. He tells me the fever broke that night and it is not likely that this was actually a wound infection. He is gone on to have an MRI of the left foot; this showed interval development of an abnormal signal from the base of the third and fourth metatarsal and in the cuboid reminiscent consistent with osteomyelitis. There is no mention of the left second toe I think which was a concern when it was ordered. The patient is taking his Levaquin 01/10/18; the patient has completed his antibiotics today.  The area on the left lateral foot is smaller but it still probes easily to bone. He has underlying osteomyelitis here and sees Dr. Megan Salon of infectious disease this week ooThe area on the right third plantar metatarsal head still requires debridement not much change in dimensions [facility] has a new wound on the medial tip of his right third toe again this probes to bone. He only noticed this 2 days ago 01/17/18; the patient saw Dr. [name] last week and he is back on Vanco and Fortaz at dialysis. 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 We are previously seeing him for a wound also on the base of the fourth med head on the right. X- ray of this area did not show osteomyelitis in relation to a new wound on the tip of his right third toe. Again this probes to bone. Finally his second toe on the left which didn't really show anything on the MRI at least no report appears to have a separated cutaneous area which I think is going to come right off the tip of his toe and leave exposed bone. Whether this is infectious or ischemic I am not clear Culture I did from the third toe last week which was his new wound was negative. Plain x-ray on the right foot did not show osteomyelitis in the toes 01/24/18; the patient is going went to see Dr. [name]. He is going to have a amputation of the left second and right third toe although paradoxically the left second toe looks better than last week. We have treating a deep probing wound on the left lateral foot and the area over the third metatarsal head on the right foot. 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 Also a debridement including bone of the left lateral foot and a closure which is still sutured. This is a bit surprising. Also apparent debridement of the base of the right third toe wound. Bit difficult to tell  what he is been doing but I think it is Xeroform to the amputation sites and the left lateral foot and silver alginate to the right plantar foot 02/09/18; on Rocephin at dialysis for osteomyelitis in the left lateral foot. I'm not sure exactly where we are in the frame of things here. The wound on the left lateral foot is still sutured also the amputation sites of the left second and right third toe. He is  been using Xeroform to the sutured areas silver alginate to the right foot 02/14/18; he continues on Rocephin at dialysis for osteomyelitis. I still don't have a good sense of where we are in the treatment duration. The wounds on the left lateral foot had the sutures removed and this clearly still probes deeply. We debrided the area with a #5 curet. We'll use silver alginate to the wound oothe area over the right third met head also required debridement of callus skin and subcutaneous tissue and necrotic debris over the wound surface. This tunnels superiorly but I did not unroofed this today. ooThe areas on the tip of his toes have a crusted surface eschar I did not debridement this either. 02/21/18; he continues on Rocephin at dialysis for osteomyelitis. I don't have a good sense of where we are and the treatment duration. The wounds on the left lateral foot is callused over on presentation but requires debridement. The area on the right third med head plantar aspect actually is measuring smaller 02/28/18;patient is on Vanco and Fortaz at dialysis, I'm not sure why I thought this was Rocephin above unless it is been changed. I don't have a good sense of time frame here. Using silver collagen to the area on the left lateral foot and right third med head plantar foot 03/07/18; patient is completing bank and Fortaz at dialysis soon. He is using Silver collagen to the area on the left lateral foot and the right third metatarsal head. He has a smaller superficial wound distal to the left lateral  foot wound. 03/14/18-he is here in follow-up evaluation for multiple ulcerations to his bilateral feet. He presents with a new ulceration to the plantar aspect of the left foot underneath the blister, this was deroofed to reveal a partial thickness ulcer. He is voicing no complaints or concerns, tolerated dialysis yesterday. We will continue with same treatment plan and he will follow-up next week 03/21/18; the patient has a area on the lateral left foot which still has a small probing area. The overall surface area of the wound is better. He presented with a new ulceration on the left foot plantar fifth metatarsal head last week. The area on the right third metatarsal head appears smaller.using silver alginate to all wound areas. The patient is changing the dressing himself. He is using a [place] forefoot offloading are on the right and a healing sandal on the left 03/28/18; original wound left lateral foot. Still nowhere close to looking like a heeling surface oosecondary wound on the left lateral foot at the base of his question fifth metatarsal which was new last week ooRight third metatarsal head. ooWe have been using silver alginate all wounds 04/04/18; the original wound on the left lateral foot again heavy callus surrounding thick nonviable subcutaneous tissue all requiring debridement. The new wound from 2 weeks ago just near this at the base of the fifth metatarsal head still looks about the same ooUnfortunately there is been deterioration in the third medical head wound on the right which now probes to bone. I must say this was a superficial wound at one point in time and I really don't have a good frame of reference year. I'll have to go back to look through records about what we know about the right foot. 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 He is also been to see Dr. [name].  Somewhere in here as somebody  is ordered a VASCULAR evaluation which was done on 03/22/18. On the right is anterior tibial artery was monophasic triphasic at the posterior tibial artery. On the left anterior tibial artery monophasic posterior tibial artery monophasic. ABI in the right was 1.43 on the left 1.21. TBIs on the right at 0.41 and on the left at 0.32. A vascular consult was recommended and I think has been arranged. 04/11/18; Mr. Boettcher has not had an MRI of the right foot since 2017. Recent x-ray of the right foot done in January and February was negative. However he has had a major deterioration in the wound over the third met head. He is completed antibiotics last week at dialysis [place]. I think he is going to see vascular in follow-up. The areas on the left lateral foot and left plantar fifth metatarsal head both look satisfactory. I debrided both of these areas although the tissue here looks good. The area on the left lateral foot once probe to bone it certainly does not do that now 04/18/18; MRI of the right foot is on Thursday. He has what looks to be serosanguineous purulent drainage coming out of the wound over the right third metatarsal head today. He completed antibiotics bank and Fortaz 2 weeks ago at dialysis. He has a vascular follow-up with regards to his arterial insufficiency although I don't exactly see when that is booked. The areas on the left foot including the lateral left foot and the plantar left fifth metatarsal head look about the same. 04/25/18-He is here in follow-up evaluation for bilateral foot ulcers. MRI obtained was negative for osteomyelitis. Wound culture was negative. We will continue with same treatment plan he'll follow-up next week 05/02/18; the right plantar foot wound over the third metatarsal head actually looks better than when I last saw this. His MRI was negative for osteomyelitis wound culture was negative. ooOn the left plantar foot  both wounds on the plantar fifth metatarsal head and on the lateral foot both are covered in a very hard circumferential callus. 05/09/18; right plantar foot wound over the third metatarsal head stable from last week. ooLeft plantar foot wound over the fifth metatarsal head also stable but with callus around both wound areas ooThe area on the left lateral foot had thick callus over a surface and I had some thoughts about leaving this intact however it felt boggy. ooWe've been using silver alginate all wounds 05/16/18; since the patient was last here he was hospitalized from 5/15 through 5/19. 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 This grew MRSA. He was placed on vancomycin.MRI of the left foot suggested "progressive" osteomyelitis and bone destruction of the cuboid and the base of the fifth metatarsal. Stable erosive changes of the base of the second metatarsal. I'm wondering if they're aware that he had surgery and debridement in the area of the underlying bone previously by Dr. [name]. In any case, He was also revascularized with an angioplasty and stenting of the left tibial peroneal trunk and angioplasty of the left posterior tibial artery. He is on vancomycin at dialysis. He has a 2 week follow-up with infectious disease. He has vascular surgery follow-up. He was started on Plavix. 05/23/18; the patient's wound on the lateral left foot at the level of the fifth metatarsal head and the plantar wound on the plantar fifth metatarsal head both look better. The area on  the right third metatarsal head still has depth and undermining. We've been using silver alginate. He is on vancomycin. He has been revascularized on the left 05/30/18; he continues on vancomycin at dialysis. Revascularized on the left. The area on the left lateral foot is just about closed. Unfortunately the area over the  plantar fifth metatarsal head undermining superiorly as does the area over the right third metatarsal head. Both of these significantly deteriorated from last week. He has appointments next week with infectious disease and orthopedics I delayed putting on a cast on the left until those appointments which therefore we made we'll bring him in on Friday the 14th with the idea of a cast on the left foot 06/06/18; he continues on vancomycin at dialysis. Revascularized on the left. He arrives today with a ballotable swelling just above the area on the left lateral foot where his previous wound was. He has no pain but he is reasonably insensate. The difficult areas on the plantar left fifth metatarsal head looks stable whereas the area on the right third plantar metatarsal head about the same as last week there is undermining here although I did not unroofed this today. He required a considerable debridement of the swelling on the left lateral foot area and I unroofed an abscess with a copious amount of brown purulent material which I obtained for culture. I don't believe during his recent hospitalization he had any further imaging although I need to review this. Previous cultures from this area done in this clinic showed MRSA, I would be surprised if this is not what this is currently even though he is on vancomycin. 06/13/18; he continues on vancomycin at dialysis however he finishes this on Sunday and then graduates the doxycycline previously prescribed by Dr. [name]. The abscess site on the lateral foot that I unroofed last time grew moderate amounts of methicillin-resistant staph aureus that is both vancomycin and tetracycline sensitive so we should be okay from that regard. He arrives today in clinic with a connection between the abscess site and the area on the lateral foot. We've been using silver alginate to all his wound areas. The right third metatarsal head wound is measuring smaller. Using  silver alginate on both wound areas 06/20/18; transitioning to doxycycline prescribed by Dr. [name]. The abscess site on the lateral foot that I unroofed 2 weeks ago grew MRSA. That area has largely closed down although the lateral part of his wound on the fifth metatarsal head still probes to bone. I suspect all of this was connected. ooOn the right third metatarsal head smaller looking wound but surrounded by nonviable tissue that once again requires debridement using silver alginate on both areas 06/27/18; on doxycycline 100 twice a day. The abscess on the lateral foot has closed down. Although he still has the wound on the left fifth metatarsal head that extends towards the lateral part of the foot. The most lateral part of this wound probes to bone ooRight third metatarsal head still a deep probing wound with undermining. Nevertheless I elected not to debridement this this week ooIf everything is copious static next week on the left I'm going to attempt a total contact cast 07/04/18; he is tolerating his doxycycline. The abscess on the left lateral foot is closed down although there is still a deep wound here that probes to bone. We will use silver collagen under the total contact cast Silver collagen to the deep wound over the right third metatarsal head is well 07/11/18; he  is tolerating doxycycline. The left lateral fifth plantar metatarsal head still probes deeply but I could not probe any bone. We've been using silver collagen and this will be the second week under the total contact cast ooSilver collagen to deep wound over the right third metatarsal head as well 07/18/18; he is still taking doxycycline. The left lateral fifth plantar metatarsal this week probes deeply with a large amount of exposed bone. Quite a deterioration. He required extensive debridement. Specimens of bone for pathology and CNS obtained ooAlso considerable debridement on the right third metatarsal head 07/25/18;  bone for pathology last week from the lateral left foot showed osteomyelitis. CandS of the bone Enterobacter. And Klebsiella. He is now on ciprofloxacin and the doxycycline is stopped [Dr. [name] of infectious disease] area He has an appointment with Dr. [name] tomorrow I'm going to leave the total contact cast off for this week. May wish to try to reapply that next week or the week after depending on the wound bed looks. I'm not sure if there is an operative option here, previously is followed with Dr. [name] 08/01/18 on evaluation today patient presents for reevaluation. He has been seen by infectious disease, Dr. [name], and he has placed the patient back on Ceftaz currently to be given to him at dialysis three times a week. 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 Fortunately there does not appear to be the evidence of infection at this point in time. Overall the patient has been tolerating the dressing changes without complication. We are no longer due lives in the cast of left foot secondary to the infection obviously. 08/10/18 on evaluation today patient appears to be doing rather well all things considered in regard to his left plantar foot ulcer. He does have a significant callous on the lateral portion of his left foot he wonders if I can help clean that away to some degree today. Fortunately he is having no evidence of infection at this time which is good news. No fevers chills noted. With that being said his right plantar foot actually does have a significant callous buildup around the wound opening this seems to not be doing as well as I would like at this point. I do believe that he would benefit from sharp debridement at this site. Subsequently I think a total contact cast would be helpful for the right foot as well. 08/17/18; the patient arrived today with a total contact cast on the right foot. This actually  looks quite good. He had gone to see Dr. Megan Salon of infectious disease about the osteomyelitis on the left foot. I think he is on IV Fortaz at dialysis although I am not exactly sure of the rationale for the Fortaz at the time of this dictation. He also arrived in today with a swelling on the lateral left foot this is the site of his original wounds in fact when I first saw this man when he was under Dr. Ardeen Garland care I think this was the site of where the wound was located. It was not particularly tender however using a small scalpel I opened it to [place] some moderate amount of purulent drainage. 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 Last MRI was in May that showed progressive osteomyelitis and bone destruction of the cuboid and  base of the fifth metatarsal. Last week he had an abscess over this same area this was removed. Culture was negative. ooWe are continuing with a total contact cast to the area on the third metatarsal head on the right and making some good progress here. The patient asked me again about renal transplant. He is not on the list because of the open wounds. 08/31/18; apparently the patient had an interruption in the [place] but he is now back on this at dialysis. Apparently there was a misunderstanding and Dr. Crissie Figures orders. Due to have the MRI of the left foot tonight. ooThe area on the right foot continues to have callus thick skin and subcutaneous tissue around the wound edge that requires constant debridement however the wound is smaller we are using a total contact cast in this area. Alginate all wounds 09/07/18; without much surprise the MRI of his left foot showed osteomyelitis in the reminiscent of his fourth metatarsal but also the third metatarsal. She arrives in clinic today with the right foot and a total contact cast. There was  purulent drainage coming out of the wound which I have cultured. Marked deterioration here with undermining widely around the wound orifice. Using pickups and a scalpel I remove callus and subcutaneous tissue from a substantial new opening. ooIn a similar fashion the area on the left lateral foot that was blistered last week I have opened this and remove skin and subcutaneous tissue from this area to expose a obvious new wound. I think there is extension and communication between all of this on the left foot. ooThe patient is on [place] at dialysis. Culture done of the right foot. We are clearly not making progress here. ooWe have made him an appointment with Dr. [name] of orthopedics. I think an amputation of the left leg may be discussed. I don't think there is anything that can be done with foot salvage. 09/14/18; culture from the right foot last time showed a very resistant MRSA. This is resistant to doxycycline. I'm going to try to get linezolid at least for a week. He does not have a appointment with Dr. [name] yet. 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 I'll send a message to Dr. [name] of infectious disease. I want the patient to see Dr. [name] to go over the pros and cons of an amputation which I think will be a BKA. The patient had a question about whether this is curative or not. I told him that I thought it would be although spread of staph aureus infection is not unheard of. MRI of the right foot was done at the end of April 2019 did not show osteomyelitis. The patient last saw Dr. [name] on 9/3. I'll send Dr. [name] a message. I would really like him to weigh the pros and cons of a BKA on the left otherwise he'll probably need IV antibiotics and perhaps hyperbaric oxygen again. He has not had a good response to this in the past. His MRI earlier this month showed progressive damage in the remnants of the fourth and  third metatarsal 09/21/2018; sees Dr. [name] of orthopedics next week to discuss a left BKA in response to the osteomyelitis in the left foot. Using silver alginate to all wound areas. He is completing the Zyvox I did put in for him after last culture showed MRSA 09/29/2018; patient saw Dr. [name] of orthopedics discussed the osteomyelitis in the left foot  third and fourth metatarsals. He did not recommend urgent surgery but certainly stated the only surgical option would be a BKA. He is communicating with Dr. [name]. The problem here is the instability of the areas on his foot which constantly generate draining abscesses. I will communicate with Dr. [name] about this. He has an appointment on 10/23 10/05/2018; patient sees Dr. [name] on 10/23. I have sent him a secure message not ordered any additional antibiotics for now. Patient continues to have 2 open areas on the left plantar foot at the fifth metatarsal head and the lateral aspect of the foot. These have not changed all that much. The area on the right third met head still has thick callus and surrounding subcutaneous tissue we have been using silver alginate 10/12/2018; patient sees Dr. [name] or colleague on 10/23. 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 The area on the right plantar foot is just about closed. The superior wound on the left fifth metatarsal head is callused over but I am not sure if this is closed. The area below it is about the same. Considerable amount of callus on the lateral foot. We have been using silver alginate to the wounds 10/19/2018; the patient saw Dr. [name] on 10/22. He wishes to try and continue to save the left foot. He has been given 6 weeks of oral ciprofloxacin. We continue to put a total contact cast on the right foot. The area over the fifth metatarsal head on the left is callused over/perhaps healed but I did not remove the callus  to find out. He still has the wound on the left lateral midfoot requiring debridement. We have been using silver alginate to all wound areas 10/26/2018 ooOn the left foot our intake nurse noted some purulent drainage from the inferior wound [currently the only one that is not callused over] ooOn the right foot even though he is in total contact cast a considerable amount of thick black callus and surface eschar. On this side we have been using silver alginate under a total contact cast. oohe remains on ciprofloxacin as prescribed by Dr. [name] 11/02/2018; culture from last week which is done of the probing area on the left midfoot wound showed MRSA "few". I am going to need to contact Dr. [name] which I will do today I think he is going to need IV antibiotics again. The area on the left foot which was callused on the side is clearly separating today all of this was removed a copious amounts of callus and necrotic subcutaneous tissue. ooThe area on the right plantar foot actually remained healthy looking with a healthy granulated base. ooWe are using silver alginate to all wound areas 11/09/2018; unfortunately neither 1 of the patient's wounds areas looks at all satisfactory. On the left he has considerable necrotic debris from the plantar wound laterally over the foot. I removed copious amounts of material including callus skin and subcutaneous tissue. On the right foot he arrives with undermining and frankly purulent drainage. Specimen obtained for culture and debridement of the callused skin and subcutaneous tissue from around the circumference. Because of this I cannot put him back in a total contact cast but to be truthful we are really unfortunately not making a lot of progress I did put in a secure text message to Dr. [name] wondering about the MRSA on the left foot. He suggested vancomycin although this is not started. I was left wondering  if he expected me to send this into  dialysis. Has we have more purulence on the right side wait for that result before calling dialysis 11/16/2018; I called dialysis earlier this week to get the vancomycin started. I think he got the first dose on Tuesday. The patient tells me that he fell earlier this week twisting his left foot and ankle and he is swelling. He continues to not look well. He had blood cultures done at dialysis on Tuesday he is not been informed of the results. 12/07/2018; the patient was admitted to hospital from 11/22/2018 through 12/02/2018. He underwent a left BKA. Apparently had staph sepsis. In the meantime being off the right foot this is closed over. He follows up with Dr. 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 Subsequent to this he actually had multiple falls in fact he tells me to following which calls the wound to be his apparently. He has been seeing Dr. [name] and his physician assistant in the interim. They actually did want him to come back for reevaluation to see if there's anything we can do for me wound care perspective the help this area to heal more appropriately. The patient states that he does not have a tremendous amount of pain which is good news. No fevers, chills, nausea, or vomiting noted at this time. We have gotten approval from Dr. [name] office had [place] for [place] to treat the patient for his stop wound. This is due to the fact that the patient was in the 90 day postop global. 1/21; the patient does not have an open wound on the right foot although he does have some pressure areas that will need to be padded when he is transferring. The dehisced surgical wound looks clean although there is some undermining of 1.5 cm superiorly. 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 This appears healthy  but there is undermining. TheraSkin #1 2/11; TheraSkin #2 2/25; TheraSkin #3. Wound is measuring smaller 3/10; TheraSkin #4 wound is measuring smaller 3/24; TheraSkin #5 wound is measuring smaller and looks healthy. 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 He has no open area on the right foot although he does have some callused area over the original third metatarsal head wound. The third metatarsal is also subluxed on the right foot. I have warned him today that he cannot consider wearing a prosthesis for at least a month but he can go for measurements. He is going to need to keep the right foot padded is much as possible in his diabetic shoe indefinitely READMISSION 06/12/2019 Mr. Cerami is a type II diabetic on dialysis. He has been in this clinic multiple times with wounds on his bilateral lower extremities. 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 He also has a history of wounds on his right foot but tells me that everything is going well here. He actually came in here with his prosthesis walking with a cane. We were all really quite gratified to see this He states over the last several weeks he has had a painful area at the tip of the left third finger. His dialysis shunt in his is in the left  upper arm and this particularly hurts during dialysis when his fingers get numb and there is a lot of pain in the tip of his left third finger. I believe he is seen vascular surgery. He had a Doppler done to evaluate for a dialysis steal syndrome. He is going for a banding procedure 1 week from today towards the left AV fistula. I think if that is unsuccessful they will be looking at creating a new shunt. He had a course of doxycycline when he which he finished about a week ago. He has been using Bactroban to the finger 6/25; the patient had his banding  procedure and things seem to be going better. He is not having pain in his hand at dialysis. The area on the tip of his finger seems about closed. He did however have a paronychia I the last time I saw him. I have been advising him to use topical Bactroban washing the finger. Culture I did last time showed a few staph epidermidis which I was willing to dismiss as superficial skin contaminant. He arrives today with the finger wound looking better however the paronychia a seems worse 7/9; 2-week follow-up. He does not have an open wound on the tip of his third left finger. I thought he had an initial ischemic wound on the tip of the finger as well as a paronychia a medially. All of this appears to be closed. 8/6-Patient returns after 3 weeks for follow-up and has a right second met head plantar callus with a significant area of maceration hiding an ulcer ABI repeated today on right - 1.26 8/13-Patient returns at 1 week, the right second met head plantar wound appears to be slightly better, it is surrounded by the callus area we are using silver alginate 8/20; the patient came back to clinic 2 weeks ago with a wound on the right second metatarsal head. He apparently told me he developed callus in this area but also went away from his custom shoes to a pair of running shoes. Using silver alginate. 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 There might be much we can do to offload this although the patient tells me he has a wheelchair that he is using to try and stay off the wound is much as possible 8/27; right second metatarsal head. Still small punched out area with thick callus and subcutaneous tissue around the wound. We have been using silver collagen. This is always been an issue with this man's wound especially on this foot 9/3; right second met head. Still the same punched-out area with thick callus and subcutaneous tissue around the wound removing the circumference  demonstrates repetitively undermining area. I have removed all the subcutaneous tissue associated with this. We have been using silver collagen 9/15; right second metatarsal head. Same punched-out thick callus and subcutaneous tissue around the wound area. Still requiring debridement we have been using silver collagen I changed back to silver alginate X-ray last time showed no evidence of osteomyelitis. Culture showed Klebsiella and he was started and Keflex apparently just 4 days ago 9/24; right second metatarsal head. He is completed the antibiotics I gave him for 10 days. Same punched-out thick callus and subcutaneous tissue around the area. Still requiring debridement. We have been using silver alginate. The area itself looks swollen and this could be just [place] that comes on this area with walking on a prosthesis on the left however I think he needs an MRI and I  am going to order that today. There is not an option to offload this further 10/1; right second plantar metatarsal head. MRI booked for October 6. Generally looking better today using silver alginate 10/8; right second plantar metatarsal head. MRI that was done on 10/6 was negative for osteomyelitis noted prior amputations of the second and fourth toes at the [facility]. Prior amputation of the third toe to the base of the middle phalanx. Prior amputation of the fifth toe to the head of the metatarsal. 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 He does not recall about hearing about this. He did not have any pain but then again he has reduced sensation from diabetic neuropathy 10/15; right second plantar metatarsal head. Still in the same condition callus nonviable subcutaneous tissue which cleans up nicely with debridement but reforms by the next week. The patient tells me that he is offloading this is much as he can including taking his wheelchair to dialysis. We have been using silver  alginate, changed to silver collagen today 10/22; right second plantar metatarsal head. Wound measures smaller but still thick callus around this wound. Still requiring debridement 11/5 right second plantar metatarsal head. Less callus around the wound circumference but still requiring debridement. There is still some undermining which I cleaned out the wound looks healthy but still considerable punched out depth with a relatively small circumference to the wound there is no palpable bone no purulent drainage 11/12; right second plantar metatarsal head. Small wound with thick callus tissue around the wound and significant undermining. We have been using silver collagen after my continuous debridements of this area 11/19; patient arrives in today with purulent drainage coming out of the wound in the second third met head area on the right foot. Serosanguineous thick drainage. Specimen obtained for culture. Objective Constitutional Sitting or standing Blood Pressure is within target range for patient.. Pulse regular and within target range for patient.Marland Kitchen Respirations regular, non-labored and within target range.. Temperature is normal and within the target range for the patient.Marland Kitchen Appears in no distress. Vitals Time Taken: 2:30 PM, Height: 73 in, Weight: 202 lbs, BMI: 26.6, Temperature: 98.0 F, Pulse: 78 bpm, Respiratory Rate: 16 breaths/min, Blood Pressure: 127/70 mmHg, Capillary Blood Glucose: 150 mg/dl. General Notes: glucose per pt report General Notes: Wound exam; extensive debridement with pickups and a #15 blade. Removed callus necrotic subcutaneous tissue down to a viable looking wound bed. Fortunately no evidence of spreading infection in the foot but certainly unroofed the abscess. Integumentary (Hair, Skin) Wound #41 status is Open. Original cause of wound was Gradually Appeared. The wound is located on the Right Metatarsal head second. The wound measures 0.3cm length x 0.4cm width x  1.2cm depth; 0.094cm^2 area and 0.113cm^3 volume. There is Fat Layer (Subcutaneous Tissue) Exposed exposed. There is no tunneling noted, however, there is undermining starting at 12:00 and ending at 12:00 with a maximum distance of 1.5cm. There is a medium amount of purulent drainage noted. The wound margin is well defined and not attached to the wound base. There is large (67-100%) red granulation within the wound bed. There is no necrotic tissue within the wound bed. 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 There was a Excisional Skin/Subcutaneous Tissue Debridement with a total area of 4 sq cm performed by Ricard Dillon., MD. With the following instrument(s): Blade, and Forceps to remove Non-Viable tissue/material. Material removed includes Callus, Subcutaneous Tissue, Slough, Skin: Dermis, and Fibrin/Exudate after achieving pain control using Lidocaine 4% Topical Solution. 1 specimen was taken by a Swab and sent to the lab per facility protocol. A time out was conducted at 14:50, prior to the start of the procedure. A Moderate amount of bleeding was controlled with Pressure. The procedure was tolerated well with a pain level of 0 throughout and a pain level of 0 following the procedure. Post Debridement Measurements: 2.2cm length x 1.9cm width x 0.3cm depth; 0.985cm^3 volume. Character of Wound/Ulcer Post Debridement is improved. Severity of Tissue Post Debridement is: Fat layer exposed. Post procedure Diagnosis Wound #41: Same as Pre-Procedure Plan Follow-up  Appointments: Return Appointment in 1 week. - to see hoyt. Dressing Change Frequency: Wound #41 Right Metatarsal head second: Change Dressing every other day. Skin Barriers/Peri-Wound Care: Barrier cream - as needed for maceration, wetness, or redness. Wound Cleansing: May shower and wash wound with soap and water. Primary Wound Dressing: Wound #41 Right Metatarsal head second: Calcium Alginate with Silver Secondary Dressing: Wound #41 Right Metatarsal head second: Kerlix/Rolled Gauze Dry Gauze Foam Border - patient may use bordered foam to cut into a felt to offload. Other: - felt to periwound and place one below wound. lightly wrap coban to hold dressing in place. felt / pad wound Edema Control: Avoid standing for long periods of time Elevate legs to the level of the heart or above for 30 minutes daily and/or when sitting, a frequency of: - throughout the day. Off-Loading: Other: - felt to periwound and place one below wound. Additional Orders / Instructions: Other: - minimize walking and standing on foot to aid in wound healing and callus buildup. Laboratory ordered were: Areobic culture-specimen not specified - culture of right foot wound. The following medication(s) was prescribed: cephalexin oral 500 mg capsule 1capsule oral bid with dose after dialysis on dialysis days for 7 days for right foot infection starting 11/15/2019 1. Specimen for CandS obtained. Empiric cephalexin 500 twice daily with a dose after dialysis on dialysis days for 7 days. Await culture 2. Could consider IV antibiotics at dialysis if the infection is not controlled. 3. He had an MRI last month of the right foot that was negative. I was not feeling that he needed repeat imaging either plain x-rays or advanced options at this point 4. There is not a way to offload this area which is unfortunate 5. Switch the dressing to silver alginate Electronic Signature(s) Signed: 11/15/2019 6:25:25 PM By: Linton Ham MD Entered By: Linton Ham on 11/15/2019 15:15:16 -------------------------------------------------------------------------------- SuperBill Details Patient Name: Date of Service: JIHAN, RUDY 11/15/2019 Medical Record [id number] Patient Account Number: [id number] Date of Birth/Sex: Treating RN: 1951/02/05 (68 y.o. [name] Primary Care Provider: [name] Other Clinician: Referring Provider: Treating Provider/Extender:Margrett Kalb, Jeronimo Greaves, Buena Irish in Treatment: 22 Diagnosis Coding ICD-10 Codes Code Description E11.621 Type 2 diabetes mellitus with foot ulcer L97.512 Non-pressure chronic ulcer of other part of right foot with fat layer exposed I70.298 Other atherosclerosis of native arteries of extremities, other extremity Facility Procedures The patient participates with Medicare or their insurance follows the Medicare Facility Guidelines: CPT4 Code Description Modifier Quantity [id number] 11042 -  DEB SUBQ TISSUE 20 SQ CM/< 1 ICD-10 Diagnosis Description E11.621 Type 2 diabetes mellitus with  foot ulcer Physician Procedures CPT4 Code: [id number] Description: 80063 - WC PHYS SUBQ TISS 20 SQ CM ICD-10 Diagnosis Description E11.621 Type 2 diabetes mellitus with foot ulcer Modifier: Quantity: 1 Electronic Signature(s) Signed: 11/15/2019 6:25:25 PM By: Linton Ham MD Entered By: Linton Ham on 11/15/2019 15:15:30

## 2019-11-16 DIAGNOSIS — N2581 Secondary hyperparathyroidism of renal origin: Secondary | ICD-10-CM | POA: Diagnosis not present

## 2019-11-16 DIAGNOSIS — Z992 Dependence on renal dialysis: Secondary | ICD-10-CM | POA: Diagnosis not present

## 2019-11-16 DIAGNOSIS — E1121 Type 2 diabetes mellitus with diabetic nephropathy: Secondary | ICD-10-CM | POA: Diagnosis not present

## 2019-11-16 DIAGNOSIS — N186 End stage renal disease: Secondary | ICD-10-CM | POA: Diagnosis not present

## 2019-11-18 DIAGNOSIS — E1121 Type 2 diabetes mellitus with diabetic nephropathy: Secondary | ICD-10-CM | POA: Diagnosis not present

## 2019-11-18 DIAGNOSIS — N2581 Secondary hyperparathyroidism of renal origin: Secondary | ICD-10-CM | POA: Diagnosis not present

## 2019-11-18 DIAGNOSIS — Z992 Dependence on renal dialysis: Secondary | ICD-10-CM | POA: Diagnosis not present

## 2019-11-18 DIAGNOSIS — N186 End stage renal disease: Secondary | ICD-10-CM | POA: Diagnosis not present
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
## 2019-11-20 DIAGNOSIS — Z992 Dependence on renal dialysis: Secondary | ICD-10-CM | POA: Diagnosis not present

## 2019-11-20 DIAGNOSIS — N186 End stage renal disease: Secondary | ICD-10-CM | POA: Diagnosis not present

## 2019-11-20 DIAGNOSIS — N2581 Secondary hyperparathyroidism of renal origin: Secondary | ICD-10-CM | POA: Diagnosis not present

## 2019-11-20 DIAGNOSIS — E1121 Type 2 diabetes mellitus with diabetic nephropathy: Secondary | ICD-10-CM | POA: Diagnosis not present

## 2019-11-20 LAB — AEROBIC/ANAEROBIC CULTURE W GRAM STAIN (SURGICAL/DEEP WOUND)
Culture: NORMAL
Gram Stain: NONE SEEN

## 2019-11-21 ENCOUNTER — Encounter (HOSPITAL_BASED_OUTPATIENT_CLINIC_OR_DEPARTMENT_OTHER): Payer: Medicare Other | Admitting: Physician Assistant

## 2019-11-21 ENCOUNTER — Other Ambulatory Visit: Payer: Self-pay

## 2019-11-21 DIAGNOSIS — E1122 Type 2 diabetes mellitus with diabetic chronic kidney disease: Secondary | ICD-10-CM | POA: Diagnosis not present

## 2019-11-21 DIAGNOSIS — E11621 Type 2 diabetes mellitus with foot ulcer: Secondary | ICD-10-CM | POA: Diagnosis not present

## 2019-11-21 DIAGNOSIS — Z89512 Acquired absence of left leg below knee: Secondary | ICD-10-CM | POA: Diagnosis not present

## 2019-11-21 DIAGNOSIS — L03115 Cellulitis of right lower limb: Secondary | ICD-10-CM | POA: Diagnosis not present

## 2019-11-21 DIAGNOSIS — I70298 Other atherosclerosis of native arteries of extremities, other extremity: Secondary | ICD-10-CM | POA: Diagnosis not present

## 2019-11-21 DIAGNOSIS — L97512 Non-pressure chronic ulcer of other part of right foot with fat layer exposed: Secondary | ICD-10-CM | POA: Diagnosis not present

## 2019-11-21 NOTE — Progress Notes (Signed)
[name], [name] ([id number]) Visit Report for 11/21/2019 Chief Complaint Document Details Patient Name: Date of Service: BRADY, [name] 11/21/2019 9:00 AM Medical Record [id number] Patient Account Number: [id number] Date of Birth/Sex: Treating RN: 18-Feb-1951 (69 y.o. 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 M) Treating RN: Baruch Gouty Primary Care Provider: [name] Other Clinician: Referring Provider: Treating Provider/Extender:Stone III, Aundria Rud in Treatment: 23 Debridement Performed for Wound #41 Right Metatarsal head second Assessment: Performed By: Physician Worthy Keeler, PA Debridement Type: Debridement Severity of Tissue Pre Fat layer exposed Debridement: Level of Consciousness (Pre- Awake and Alert procedure): Pre-procedure Verification/Time Out Taken: Yes - 10:20 Start Time: 10:22 Pain Control: Lidocaine 4% Topical Solution Total Area Debrided (L x W): 3 (cm) x 3 (cm) = 9 (cm) Tissue and other material Viable, Non-Viable, Callus, Slough, Subcutaneous, Skin: Epidermis, Slough Viable, Non-Viable, Callus, Slough, Subcutaneous, Skin: Epidermis, Slough debrided: Level: Skin/Subcutaneous Tissue Debridement Description: Excisional Instrument: Curette Bleeding:  Minimum Hemostasis Achieved: Pressure End Time: 10:25 Procedural Pain: 0 Post Procedural Pain: 0 Response to Treatment: Procedure was tolerated well Level of Consciousness Awake and Alert (Post-procedure): Post Debridement Measurements of Total Wound Length: (cm) 0.4 Width: (cm) 0.3 Depth: (cm) 0.2 Volume: (cm) 0.019 Character of Wound/Ulcer Post Improved Debridement: Severity of Tissue Post Debridement: Fat layer exposed Post Procedure Diagnosis Same as Pre-procedure Electronic Signature(s) Signed: 11/21/2019 3:14:28 PM By: Baruch Gouty RN, BSN Signed: 11/21/2019 4:52:33 PM By: Worthy Keeler PA-C Entered By: Baruch Gouty on 11/21/2019 10:24:42 -------------------------------------------------------------------------------- HPI Details Patient Name: Date of Service: [name] Pulley B. 11/21/2019 9:00 AM Medical Record [id number] Patient Account Number: [id number] Date of Birth/Sex: Treating RN: 1951/08/26 (68 y.o. M) Primary Care Provider: [name] Other Clinician: Referring Provider: Treating Provider/Extender:Stone III, Aundria Rud in Treatment: 23 History of Present Illness Location: Patient presents with a wound to bilateral feet. Quality: Patient reports experiencing essentially no pain. Severity: Mildly severe wound with no evidence of infection Duration: Patient has had the wound for greater than 2 weeks prior to presenting for treatment HPI Description: The patient is a pleasant 68 yrs old bm here for evaluation of ulcers on the plantar aspect of both feet. 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 He has a left arm graft for access. He has been trying to stay off his feet for weeks but does not seem to have any improvement in the wound. He has been seeing someone at the foot center and was referred to the Wound Care center for further evaluation. 12/30/15 the patient has 3  wounds one over the right first metatarsal head and 2 on the left foot at the left fifth and lleft first metatarsal head. All of these look relatively similar. The one over the left fifth probe to bone I could not prove that any of the others did. I note his MRI in November that did not show osteomyelitis. His peripheral pulses seem robust. All of these underwent  surgical debridement to remove callus nonviable skin and subcutaneous tissue 01/06/16; the patient had his sutures removed from the right fifth ray amputation. There may be a small open part of this superiorly but otherwise the incision looks good. Areas over his right first, left first and left fifth metatarsal head all underwent surgical debridement as a varying degree of callus, skin and nonviable subcutaneous tissue. The area that is most worrisome is the right fifth metatarsal head which has a wound probes precariously close to bone. There is no purulent drainage or erythema 01/13/16; I'm not exactly sure of the status of the right fifth ray amputation site however he follows with Dr. [name] later this week. The area over his right first plantar metatarsal head, left first and fifth plantar metatarsal head are all in the same status. Thick circumferential callus, nonviable subcutaneous tissue. Culture of the left fifth did not culture last week 01/20/16; all of the patient's wounds appear and roughly the same state although his amputation site on the right lateral foot looks better. His wounds over the right first, left first and left fifth metatarsal heads all underwent difficult surgical debridement removing circumferential callus nonviable skin and subcutaneous tissue. There is no overt evidence of infection in these areas. MRI at the end of December of the left foot did not show osteomyelitis, right foot showed osteomyelitis of the right fifth digit he is now status post amputation. 01/27/16 the patient's wounds over his plantar first  and fifth metatarsal heads on the left all appear better having been started on a total contact cast last week. They were debridement of circumferential callus and nonviable subcutaneous tissue as was the wound over the first metatarsal head. His surgical incision on the right also had some light surface debridement done. 03/23/2016 -- the patient was doing really well and most of his wounds had almost completely healed but now he came back today with a history of having a discharge from the area of his right foot on the plantar aspect and also between his first and second toe. He also has had some discharge from the left foot. Addendum: I spoke to the PA [name] at the dialysis center whose fax number is [phone]. We discussed the infection the patient has and she will put the patient on vancomycin and Fortaz until the final culture report is back. We will fax this as soon as available. 03/30/2016 -- left foot x-ray IMPRESSION:No definitive osteomyelitis noted. X-ray of the right foot -- IMPRESSION: 1. Soft tissue swelling. Prior amputation right fifth digit. No acute or focal bony abnormality identified. If osteomyelitis remains a clinical concern, MRI can be obtained. 2. Peripheral vascular disease. His culture reports have grown an MSSA -- and we will fax this report to his hemodialysis center. He was called in a prescription of oral doxycycline but I have told her not to fill this in as he is already on IV antibiotics. 04/06/2016 -- a few days ago, I spoke to the hemodialysis center nurse who had stopped the IV antibiotics and he was given a prescription for doxycycline 100 mg by mouth twice a day for a week and he is on this at the present time. 05/11/2016 -- he has recently seen his PCP this week and his hemoglobin A1c was 7. He is working on his paperwork to get his orthotic shoes. 05/25/2016 -- -- x-ray of the left foot IMPRESSION:No acute bony abnormality. No  radiographic changes of acute osteomyelitis. No change since prior  study. X-ray of the right foot -- IMPRESSION: Postsurgical changes are seen involving the fifth toe. No evidence of acute osteomyelitis. he developed a large blister on the medial part of his right foot and this opened out and drain fluid. 06/01/2016 -- he has his MRI to be done this afternoon. 06/15/2016 -- MRI of the left forefoot without contrast shows 2 separate regions of cutaneous and subcutaneous edema and possible ulceration and blistering along the ball of the foot. No obvious osteomyelitis identified. MRI of the right foot showed cutaneous and subcutaneous thickening plantar to the first digit sesamoid with an ulcer crater but no underlying osteomyelitis is identified. 06/22/2016 -- the right foot plantar ulcer has been draining a lot of seropurulent material for the last few days. 06/30/2016 -- spoke to the dialysis center and I believe I spoke to [place] a PA at the center who discussed with me and agreed to putting Legrand Como on vancomycin until his cultures arrive. On review of his culture report no WBCs were seen or no organisms were seen and the culture was reincubated for better growth. The final report is back and there were no predominant growth including Streptococcus or Staphylococcus. Clinically though he has a lot of drainage from both wounds a lot of undermining and there is further blebs on the left foot towards the interspace between his first and second toe. 08/10/2016 -- the culture from the right foot showed normal skin flora and there was no Staphylococcus aureus was group A streptococcus isolated. 09/21/2016 -- -- MRI of the right foot was done on 09/13/2016 - IMPRESSION: Findings most consistent with acute osteomyelitis throughout the great toe, sesamoid bones and plantar aspect of the head of the first metatarsal. Fluid in the sheath of the flexor tendon of the great toe could be sympathetic but is  worrisome for septic tenosynovitis. First MTP joint effusion worrisome for septic joint. He was admitted to the hospital on 09/11/2016 and treated for a fever with vancomycin and cefepime. He was seen by Dr. Bobby Rumpf of infectious disease who recommended 6 weeks treatment with vancomycin and ceftazidime with his hemodialysis and to continue to see as in the wound clinic. Vascular consult was pending. The patient was discharged home on 09/14/2016 and he would continue with IV antibiotics for 6 weeks. 09/28/16 wound appears reasonably healthy. Continuing with total contact cast 10/05/16 wound is smaller and looking healthy. Continue with total contact cast. He continues on IV antibiotics 10/12/2016 -- he has developed a new wound on the dorsal aspect of his left big toe and this is a superficial injury with no surrounding cellulitis. He has completed 30 days of IV antibiotics and is now ready to start his hyperbaric oxygen therapy as per his insurance company's recommendation. 10/19/2016 -- after the cast was removed on the right side he has got good resolution of his ulceration on the right plantar foot. he has a new wound on the left plantar foot in the region of his fourth metatarsal and this will need sharp debridement. 10/26/2016 -- Xray of the right foot complete: IMPRESSION: Changes consistent with osteomyelitis involving the head of the first metatarsal and base of the first proximal phalanx. The sesamoid bones are also likely involved given their positioning. 11/02/2016 -- there still awaiting insurance clearance for his hyperbaric oxygen therapy and hopefully he will begin treatment soon. 11/09/2016 -- started with hyperbaric oxygen therapy and had some barotrauma to the right ear and this was seen by ENT who was prescribed  Afrin drops and would probably continue with HBO and he is scheduled for myringotomy tubes on Friday 11/16/2016 -- he had his myringotomy tubes placed on  Friday and has been doing much better after that with some fluid draining out after hyperbaric treatment today. The pain was much better. 11/30/2016 -- over the last 2 days he noticed a swelling and change of color of his right second toe and this had been draining minimal fluid. 12/07/2016 -- x-ray of the right foot -- IMPRESSION:1. Progressive ulceration at the distal aspect of the second digit with significant soft tissue swelling and osseous changes in the distal phalanx compatible with osteomyelitis. 2. Chronic osteomyelitis at the first MTP joint. 3. Ulcerations at the second and third toes as well without definite osseous changes. Osteomyelitis is not excluded. 4. Fifth digit amputation. On 12/01/2016 I spoke to Dr. Bobby Rumpf, the infectious disease specialist, who kindly agreed to treat this with IV antibiotics and he would call in the order to the dialysis center and this has been discussed in detail with the patient who will make the appropriate arrangements. The patient will also book an appointment as soon as possible to see Dr. [name] in the office. Of note the patient has not been on antibiotics this entire week as the dialysis center did not receive any orders from Dr. [name]. I got in touch with Dr. [name] who tells me the patient has an appointment to see him this coming Wednesday. 12/14/2016 -- the patient is on ceftazidime and vancomycin during his dialysis, and I understand this was put on by his nephrologist Dr. Raliegh Ip possibly after speaking with infectious disease Dr. [name] 12/23/16; patient was on my schedule today for a wound evaluation as he had difficulties with his schedule earlier this week. I note that he is on vancomycin and ceftazidine at dialysis. In spite of this he arrives today with a new wound on the base of the right second toe this easily probes to bone. The known wound at the tip of the second toe With this area. He also has a superficial area on the  medial aspect of the third toe on the side of the DIP. This does not appear to have much depth. The area on the plantar left foot is a deep area but did not probe the bone 12/28/16 -- he has brought in some lab work and the most recent labs done showed a hemoglobin of 12.1 hematocrit of 36.3, neutrophils of 51% WBC count of 5.6, BN of 53, albumin of 4.1 globulin of 3.7, vancomycin 13 g per mL. 01/04/2017 -- he saw Dr. [name] who has recommended a amputation of the right second toe and he is awaiting this date. He sees Dr. Bobby Rumpf of infectious disease tomorrow. The bone culture taken on 12/28/2016 had no growth in 2 days. 01/11/2017 -- was seen by Dr. Bobby Rumpf regarding the management and has recommended a eval by vascular surgeons. He recommended to continue the antibiotics during his hemodialysis. Xray of the left foot -- IMPRESSION: No acute fracture, dislocation, or osseous erosion identified. 01/18/2017 -- he has his vascular workup later today and he is going to have his right foot second toe amputation this coming Friday by Dr. [name]. We have also put in for a another 30 treatments with hyperbaric oxygen therapy 01/25/2017 -- I have reviewed Dr. Donnetta Hutching is vascular report from last week where he reviewed him and thought his vascular function was good enough to heal his amputation site  and no further tests were recommended. He also had his orthopedic related to surgery which is still pending the notes and we will review these next week. 02/01/2017 -- the operative remote of Dr. 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 02/08/2017 --he was seen by Dr. [name] on 02/03/2017 -- patient is on [place]. after a thorough review he had recommended to continue with antibiotics during hemodialysis. The patient was seen by Dr. [name], but I do not find any follow-up note on the electronic medical record. he has removed the dressing over the right foot to remove the sutures and asked him to see him back in a month's time 02/22/2017 -- patient has been febrile and has been having symptoms of the upper respiratory tract infection but has not been checked for the flu and it's been over 4 days now. He says he is feeling better today. Some drainage between his left first and second toe and this needed to be looked at 03/08/2017 -- was seen by Dr. Bobby Rumpf on 03/07/2017 after review he will stop his antibiotics and see him back in a month to see if he is feeling well. 03/15/2017 -- he has completed his course of hyperbaric oxygen therapy and is doing fine with his health otherwise. 03/22/2017 -- his nutritionist at the dialysis center has recommended a protein supplement to help build his collagen and we will prescribe this for him when he has the details 05/03/2017 -- he recently noticed the area on the plantar aspect of his fifth metatarsal head which opened out and had minimal drainage. 05/10/2017 -- -- x-ray of the left foot -- IMPRESSION:1. No convincing conventional radiographic evidence of active osteomyelitis.2. Active soft tissue ulceration at the tip of the fifth digit, and at the lateral aspect of the foot adjacent the base of the fifth metatarsal. 3. Surgical changes of prior fourth toe amputation. 4. 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 5. Small vessel atherosclerotic vascular calcifications. 6. 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 ======= Readmission after 5 weeks: 06/15/2017 -- the patient  returns after 5 weeks having had an MRI done on 05/17/2017 MRI of the left foot without contrast showed soft tissue ulcer overlying the base of the fifth metatarsal. 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 No drainable fluid collection to suggest an abscess. He was in hospital between 05/16/2017 and 05/25/2017 -- and on discharge was asked to follow-up with Dr. [name] and Dr. [name]. He was treated 2 weeks post discharge with vancomycin plus ceftriaxone with hemodialysis and oral metronidazole 500 mg 3 times a day. 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 last hemoglobin A1c was 6.7% He had a postoperative MR of the foot on 05/23/2017 -- which showed no new areas of cortical bone loss, edema or soft tissue ulceration to suggest osteomyelitis. the patient has completed his course of IV antibiotics during dialysis and is to see the infectious disease doctor tomorrow. Dr. [name] had seen him after suture removal and asked him  to keep the wound with a dry dressing 06/21/2017 -- he was seen by Dr. Bobby Rumpf of infectious disease on 06/16/2017 -- he stopped his Flagyl and will see him back in 6 weeks. He was asked to follow-up with Dr. [name] and with the wound clinic clinic. 07/05/17; bone biopsy from last time showed acute osteomyelitis. This is from the reminiscent in the left fifth metatarsal. Culture result is apparently still pending [holding for anaerobe}. From my understanding in this case this is been a progressive necrotic wound which is deteriorated markedly over the last 3 weeks since he returned here. He now has a large area of exposed bone which was biopsied and cultured last week. Dr. Graylon Good has put him on vancomycin and Fortaz during his hemodialysis and Flagyl orally. He is to see Dr. [name] next week 07/19/2017 -- the patient was reviewed by Dr. [name] of  orthopedics who reviewed the case in detail and agreed with the plan to continue with IV antibiotics, aggressive wound care and hyperbaric oxygen therapy. He would see him back in 3 weeks' time 08/09/2017 -- saw Dr. Bobby Rumpf on 08/03/2017 -he was restarted on his antibiotics for 6 weeks which included vanco, ceftaz and Flagyl. he recommended continuing his antibiotics for 6 weeks and reevaluate his completion date. He would continue with wound care and hyperbaric oxygen therapy. 08/16/2017 -- I understand he will be completing his 6 weeks of antibiotics sometime later this week. 09/19/2017 -- he has been without his wound VAC for the last week due to lack of supplies. He has been packing his wound with silver alginate. 10/04/2017 -- he has an appointment with Dr. [name] tomorrow and hence we will not apply the wound VAC after his dressing changes today. 10/11/2017 -- he was seen by Dr. Bobby Rumpf on 10/05/2017, noted that the patient was currently off antibiotics, and after thorough review he recommended he follow-up with the wound care as per plan and no further antibiotics at this point. 11/29/17; patient is arrived for the wound on his left lateral foot.Marland Kitchen He is completed IV antibiotics and 2 rounds of hyperbaric oxygen for treatment of underlying osteomyelitis. He arrives today with a surface on most of the wound area however our intake nurse noted drainage from the superior aspect. This was brought to my attention. 12/13/2017 -- the right plantar foot had a large bleb and once the bleb was opened out a large callus and subcutaneous debris was removed and he has a plantar ulcer near the fourth metatarsal head 12/28/17 on evaluation today patient appears to be doing acutely worse in regard to his left foot. The wound which has been appearing to do better as now open up more deeply there is bone palpable at the base of the wound unfortunately. He tells me that this feels "like it did  when he had osteomyelitis previously" he also noted that his second toe on the left foot appears to be doing worse and is swollen there does appear to be some fluid collected underneath. His right foot plantar ulcer appears to be doing somewhat better at this point and there really is no complication at the site currently. No fevers, chills, nausea, or vomiting noted at this time. Patient states that he normally has no pain at this site however. Today he is having significant pain. 01/03/18; culture done last week showed methicillin sensitive staph aureus and group B strep. He was on [place] however he arrives today with a fever of 101. He has  a functional dialysis shunt in his left arm but has had no pain here. No cough. He still makes some urine no dysuria he did have abdominal pain nausea and diarrhea over the weekend but is not had any diarrhea since yesterday. Otherwise he has no specific complaints 01/05/18; the patient returns today in follow-up for his presentation from 1/8. At that point he was febrile. I gave him some Levaquin adjusted for his dialysis status. He tells me the fever broke that night and it is not likely that this was actually a wound infection. He is gone on to have an MRI of the left foot; this showed interval development of an abnormal signal from the base of the third and fourth metatarsal and in the cuboid reminiscent consistent with osteomyelitis. There is no mention of the left second toe I think which was a concern when it was ordered. The patient is taking his Levaquin 01/10/18; the patient has completed his antibiotics today. The area on the left lateral foot is smaller but it still probes easily to bone. He has underlying osteomyelitis here and sees Dr. Megan Salon of infectious disease this week The area on the right third plantar metatarsal head still requires debridement not much change in dimensions He has a new wound on the medial tip of his right third toe again  this probes to bone. He only noticed this 2 days ago 01/17/18; the patient saw Dr. [name] last week and he is back on Vanco and Fortaz at dialysis. 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 We are previously seeing him for a wound also on the base of the fourth med head on the right. X- ray of this area did not show osteomyelitis in relation to a new wound on the tip of his right third toe. Again this probes to bone. Finally his second toe on the left which didn't really show anything on the MRI at least no report appears to have a separated cutaneous area which I think is going to come right off the tip of his toe and leave exposed bone. Whether this is infectious or ischemic I am not clear Culture I did from the third toe last week which was his new wound was negative. Plain x-ray on the right foot did not show osteomyelitis in the toes 01/24/18; the patient is going went to see Dr. [name]. He is going to have a amputation of the left second and right third toe although paradoxically the left second toe looks better than last week. We have treating a deep probing wound on the left lateral foot and the area over the third metatarsal head on the right foot. 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 Also a debridement including bone of the left lateral foot and a closure which is still sutured. This is a bit surprising. Also apparent debridement of the base of the right third toe wound. 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 I'm not sure exactly where we are in the frame of things here. The wound on the left lateral foot is still sutured also the  amputation sites of the left second  and right third toe. He is been using Xeroform to the sutured areas silver alginate to the right foot 02/14/18; he continues on Rocephin at dialysis for osteomyelitis. I still don't have a good sense of where we are in the treatment duration. The wounds on the left lateral foot had the sutures removed and this clearly still probes deeply. We debrided the area with a #5 curet. We'll use silver alginate to the wound the area over the right third met head also required debridement of callus skin and subcutaneous tissue and necrotic debris over the wound surface. This tunnels superiorly but I did not unroofed this today. The areas on the tip of his toes have a crusted surface eschar I did not debridement this either. 02/21/18; he continues on Rocephin at dialysis for osteomyelitis. I don't have a good sense of where we are and the treatment duration. The wounds on the left lateral foot is callused over on presentation but requires debridement. The area on the right third med head plantar aspect actually is measuring smaller 02/28/18;patient is on Vanco and Fortaz at dialysis, I'm not sure why I thought this was Rocephin above unless it is been changed. I don't have a good sense of time frame here. Using silver collagen to the area on the left lateral foot and right third med head plantar foot 03/07/18; patient is completing bank and Fortaz at dialysis soon. He is using Silver collagen to the area on the left lateral foot and the right third metatarsal head. He has a smaller superficial wound distal to the left lateral foot wound. 03/14/18-he is here in follow-up evaluation for multiple ulcerations to his bilateral feet. He presents with a new ulceration to the plantar aspect of the left foot underneath the blister, this was deroofed to reveal a partial thickness ulcer. He is voicing no complaints or concerns, tolerated dialysis yesterday. We will continue with same  treatment plan and he will follow-up next week 03/21/18; the patient has a area on the lateral left foot which still has a small probing area. The overall surface area of the wound is better. He presented with a new ulceration on the left foot plantar fifth metatarsal head last week. The area on the right third metatarsal head appears smaller.using silver alginate to all wound areas. The patient is changing the dressing himself. He is using a Darco forefoot offloading are on the right and a healing sandal on the left 03/28/18; original wound left lateral foot. Still nowhere close to looking like a heeling surface secondary wound on the left lateral foot at the base of his question fifth metatarsal which was new last week Right third metatarsal head. We have been using silver alginate all wounds 04/04/18; the original wound on the left lateral foot again heavy callus surrounding thick nonviable subcutaneous tissue all requiring debridement. The new wound from 2 weeks ago just near this at the base of the fifth metatarsal head still looks about the same Unfortunately there is been deterioration in the third medical head wound on the right which now probes to bone. I must say this was a superficial wound at one point in time and I really don't have a good frame of reference year. I'll have to go back to look through records about what we know about the right foot. 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 He is also been to see Dr. [name].  Somewhere in here as somebody is ordered a VASCULAR evaluation which was done on 03/22/18. On the right is anterior tibial artery was monophasic triphasic at the posterior tibial artery. On the left anterior tibial artery monophasic posterior tibial artery monophasic. ABI in the right was 1.43 on the left 1.21. TBIs on the right at 0.41 and on the left at 0.32. A vascular  consult was recommended and I think has been arranged. 04/11/18; Mr. Julia has not had an MRI of the right foot since 2017. Recent x-ray of the right foot done in January and February was negative. However he has had a major deterioration in the wound over the third met head. He is completed antibiotics last week at dialysis [place]. I think he is going to see vascular in follow-up. The areas on the left lateral foot and left plantar fifth metatarsal head both look satisfactory. I debrided both of these areas although the tissue here looks good. The area on the left lateral foot once probe to bone it certainly does not do that now 04/18/18; MRI of the right foot is on Thursday. He has what looks to be serosanguineous purulent drainage coming out of the wound over the right third metatarsal head today. He completed antibiotics bank and Fortaz 2 weeks ago at dialysis. He has a vascular follow-up with regards to his arterial insufficiency although I don't exactly see when that is booked. The areas on the left foot including the lateral left foot and the plantar left fifth metatarsal head look about the same. 04/25/18-He is here in follow-up evaluation for bilateral foot ulcers. MRI obtained was negative for osteomyelitis. Wound culture was negative. We will continue with same treatment plan he'll follow-up next week 05/02/18; the right plantar foot wound over the third metatarsal head actually looks better than when I last saw this. His MRI was negative for osteomyelitis wound culture was negative. On the left plantar foot both wounds on the plantar fifth metatarsal head and on the lateral foot both are covered in a very hard circumferential callus. 05/09/18; right plantar foot wound over the third metatarsal head stable from last week. Left plantar foot wound over the fifth metatarsal head also stable but with callus around both wound areas The area on the left lateral foot had thick callus  over a surface and I had some thoughts about leaving this intact however it felt boggy. We've been using silver alginate all wounds 05/16/18; since the patient was last here he was hospitalized from 5/15 through 5/19. 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 This grew MRSA. He was placed on vancomycin.MRI of the left foot suggested "progressive" osteomyelitis and bone destruction of the cuboid and the base of the fifth metatarsal. Stable erosive changes of the base of the second metatarsal. I'm wondering if they're aware that he had surgery and debridement in the area of the underlying bone previously by Dr. [name]. In any case, He was also revascularized with an angioplasty and stenting of the left tibial peroneal trunk and angioplasty of the left posterior tibial artery. He is on vancomycin at dialysis. He has a 2 week follow-up with infectious disease. He has vascular surgery follow-up. He was started on Plavix. 05/23/18; the patient's wound on the lateral left foot at the level of the fifth metatarsal head and the plantar wound on the plantar fifth metatarsal head  both look better. The area on the right third metatarsal head still has depth and undermining. We've been using silver alginate. He is on vancomycin. He has been revascularized on the left 05/30/18; he continues on vancomycin at dialysis. Revascularized on the left. The area on the left lateral foot is just about closed. Unfortunately the area over the plantar fifth metatarsal head undermining superiorly as does the area over the right third metatarsal head. Both of these significantly deteriorated from last week. He has appointments next week with infectious disease and orthopedics I delayed putting on a cast on the left until those appointments which therefore we made we'll bring him in on Friday the 14th with the idea of a cast on  the left foot 06/06/18; he continues on vancomycin at dialysis. Revascularized on the left. He arrives today with a ballotable swelling just above the area on the left lateral foot where his previous wound was. He has no pain but he is reasonably insensate. The difficult areas on the plantar left fifth metatarsal head looks stable whereas the area on the right third plantar metatarsal head about the same as last week there is undermining here although I did not unroofed this today. He required a considerable debridement of the swelling on the left lateral foot area and I unroofed an abscess with a copious amount of brown purulent material which I obtained for culture. I don't believe during his recent hospitalization he had any further imaging although I need to review this. Previous cultures from this area done in this clinic showed MRSA, I would be surprised if this is not what this is currently even though he is on vancomycin. 06/13/18; he continues on vancomycin at dialysis however he finishes this on Sunday and then graduates the doxycycline previously prescribed by Dr. [name]. The abscess site on the lateral foot that I unroofed last time grew moderate amounts of methicillin-resistant staph aureus that is both vancomycin and tetracycline sensitive so we should be okay from that regard. He arrives today in clinic with a connection between the abscess site and the area on the lateral foot. We've been using silver alginate to all his wound areas. The right third metatarsal head wound is measuring smaller. Using silver alginate on both wound areas 06/20/18; transitioning to doxycycline prescribed by Dr. [name]. The abscess site on the lateral foot that I unroofed 2 weeks ago grew MRSA. That area has largely closed down although the lateral part of his wound on the fifth metatarsal head still probes to bone. I suspect all of this was connected. On the right third metatarsal head smaller looking  wound but surrounded by nonviable tissue that once again requires debridement using silver alginate on both areas 06/27/18; on doxycycline 100 twice a day. The abscess on the lateral foot has closed down. Although he still has the wound on the left fifth metatarsal head that extends towards the lateral part of the foot. The most lateral part of this wound probes to bone Right third metatarsal head still a deep probing wound with undermining. Nevertheless I elected not to debridement this this week If everything is copious static next week on the left I'm going to attempt a total contact cast 07/04/18; he is tolerating his doxycycline. The abscess on the left lateral foot is closed down although there is still a deep wound here that probes to bone. We will use silver collagen under the total contact cast Silver collagen to the deep wound over the right third  metatarsal head is well 07/11/18; he is tolerating doxycycline. The left lateral fifth plantar metatarsal head still probes deeply but I could not probe any bone. We've been using silver collagen and this will be the second week under the total contact cast Silver collagen to deep wound over the right third metatarsal head as well 07/18/18; he is still taking doxycycline. The left lateral fifth plantar metatarsal this week probes deeply with a large amount of exposed bone. Quite a deterioration. He required extensive debridement. Specimens of bone for pathology and CNS obtained Also considerable debridement on the right third metatarsal head 07/25/18; bone for pathology last week from the lateral left foot showed osteomyelitis. CandS of the bone Enterobacter. And Klebsiella. He is now on ciprofloxacin and the doxycycline is stopped [Dr. [name] of infectious disease] area He has an appointment with Dr. [name] tomorrow I'm going to leave the total contact cast off for this week. May wish to try to reapply that next week or the week after depending  on the wound bed looks. I'm not sure if there is an operative option here, previously is followed with Dr. [name] 08/01/18 on evaluation today patient presents for reevaluation. He has been seen by infectious disease, Dr. [name], and he has placed the patient back on Ceftaz currently to be given to him at dialysis three times a week. 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 Fortunately there does not appear to be the evidence of infection at this point in time. Overall the patient has been tolerating the dressing changes without complication. We are no longer due lives in the cast of left foot secondary to the infection obviously. 08/10/18 on evaluation today patient appears to be doing rather well all things considered in regard to his left plantar foot ulcer. He does have a significant callous on the lateral portion of his left foot he wonders if I can help clean that away to some degree today. Fortunately he is having no evidence of infection at this time which is good news. No fevers chills noted. With that being said his right plantar foot actually does have a significant callous buildup around the wound opening this seems to not be doing as well as I would like at this point. I do believe that he would benefit from sharp debridement at this site. Subsequently I think a total contact cast would be helpful for the right foot as well. 08/17/18; the patient arrived today with a total contact cast on the right foot. This actually looks quite good. He had gone to see Dr. Megan Salon of infectious disease about the osteomyelitis on the left foot. I think he is on IV Fortaz at dialysis although I am not exactly sure of the rationale for the Fortaz at the time of this dictation. He also arrived in today with a swelling on the lateral left foot this is the site of his original wounds in fact when I first saw this man when he was under  Dr. Ardeen Garland care I think this was the site of where the wound was located. It was not particularly tender however using a small scalpel I opened it to [place] some moderate amount of purulent drainage. 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 Last MRI was in May that showed progressive osteomyelitis and  bone destruction of the cuboid and base of the fifth metatarsal. Last week he had an abscess over this same area this was removed. Culture was negative. We are continuing with a total contact cast to the area on the third metatarsal head on the right and making some good progress here. The patient asked me again about renal transplant. He is not on the list because of the open wounds. 08/31/18; apparently the patient had an interruption in the [place] but he is now back on this at dialysis. Apparently there was a misunderstanding and Dr. Crissie Figures orders. Due to have the MRI of the left foot tonight. The area on the right foot continues to have callus thick skin and subcutaneous tissue around the wound edge that requires constant debridement however the wound is smaller we are using a total contact cast in this area. Alginate all wounds 09/07/18; without much surprise the MRI of his left foot showed osteomyelitis in the reminiscent of his fourth metatarsal but also the third metatarsal. She arrives in clinic today with the right foot and a total contact cast. There was purulent drainage coming out of the wound which I have cultured. Marked deterioration here with undermining widely around the wound orifice. Using pickups and a scalpel I remove callus and subcutaneous tissue from a substantial new opening. In a similar fashion the area on the left lateral foot that was blistered last week I have opened this and remove skin and subcutaneous tissue from this area to expose a  obvious new wound. I think there is extension and communication between all of this on the left foot. The patient is on Fortaz at dialysis. Culture done of the right foot. We are clearly not making progress here. We have made him an appointment with Dr. [name] of orthopedics. I think an amputation of the left leg may be discussed. I don't think there is anything that can be done with foot salvage. 09/14/18; culture from the right foot last time showed a very resistant MRSA. This is resistant to doxycycline. I'm going to try to get linezolid at least for a week. He does not have a appointment with Dr. [name] yet. 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 I'll send a message to Dr. [name] of infectious disease. I want the patient to see Dr. [name] to go over the pros and cons of an amputation which I think will be a BKA. The patient had a question about whether this is curative or not. I told him that I thought it would be although spread of staph aureus infection is not unheard of. MRI of the right foot was done at the end of April 2019 did not show osteomyelitis. The patient last saw Dr. [name] on 9/3. I'll send Dr. [name] a message. I would really like him to weigh the pros and cons of a BKA on the left otherwise he'll probably need IV antibiotics and perhaps hyperbaric oxygen again. He has not had a good response to this in the past. His MRI earlier this month showed progressive damage in the remnants of the fourth and third metatarsal 09/21/2018; sees Dr. [name] of orthopedics next week to discuss a left BKA in response to the osteomyelitis in the left foot. Using silver alginate to all wound areas. He is completing the Zyvox I did put in for him after last culture showed MRSA 09/29/2018; patient saw Dr. [name] of orthopedics discussed the  osteomyelitis in the left foot third and fourth metatarsals. He did not recommend urgent surgery  but certainly stated the only surgical option would be a BKA. He is communicating with Dr. [name]. The problem here is the instability of the areas on his foot which constantly generate draining abscesses. I will communicate with Dr. [name] about this. He has an appointment on 10/23 10/05/2018; patient sees Dr. [name] on 10/23. I have sent him a secure message not ordered any additional antibiotics for now. Patient continues to have 2 open areas on the left plantar foot at the fifth metatarsal head and the lateral aspect of the foot. These have not changed all that much. The area on the right third met head still has thick callus and surrounding subcutaneous tissue we have been using silver alginate 10/12/2018; patient sees Dr. [name] or colleague on 10/23. 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 The area on the right plantar foot is just about closed. The superior wound on the left fifth metatarsal head is callused over but I am not sure if this is closed. The area below it is about the same. Considerable amount of callus on the lateral foot. We have been using silver alginate to the wounds 10/19/2018; the patient saw Dr. [name] on 10/22. He wishes to try and continue to save the left foot. He has been given 6 weeks of oral ciprofloxacin. We continue to put a total contact cast on the right foot. The area over the fifth metatarsal head on the left is callused over/perhaps healed but I did not remove the callus to find out. He still has the wound on the left lateral midfoot requiring debridement. We have been using silver alginate to all wound areas 10/26/2018 On the left foot our intake nurse noted some purulent drainage from the inferior wound [currently the only one that is not callused over] On the right foot even though he is in total contact cast a considerable amount of thick black callus and surface eschar. On this  side we have been using silver alginate under a total contact cast. he remains on ciprofloxacin as prescribed by Dr. [name] 11/02/2018; culture from last week which is done of the probing area on the left midfoot wound showed MRSA "few". I am going to need to contact Dr. [name] which I will do today I think he is going to need IV antibiotics again. The area on the left foot which was callused on the side is clearly separating today all of this was removed a copious amounts of callus and necrotic subcutaneous tissue. The area on the right plantar foot actually remained healthy looking with a healthy granulated base. We are using silver alginate to all wound areas 11/09/2018; unfortunately neither 1 of the patient's wounds areas looks at all satisfactory. On the left he has considerable necrotic debris from the plantar wound laterally over the foot. I removed copious amounts of material including callus skin and subcutaneous tissue. On the right foot he arrives with undermining and frankly purulent drainage. Specimen obtained for culture and debridement of the callused skin and subcutaneous tissue from around the circumference. Because of this I cannot put him back in a total contact cast but to be truthful we are really unfortunately not making a lot of progress I did put in a secure text message to Dr. [name] wondering about the MRSA on the left foot. He suggested vancomycin although this is  not started. I was left wondering if he expected me to send this into dialysis. Has we have more purulence on the right side wait for that result before calling dialysis 11/16/2018; I called dialysis earlier this week to get the vancomycin started. I think he got the first dose on Tuesday. The patient tells me that he fell earlier this week twisting his left foot and ankle and he is swelling. He continues to not look well. He had blood cultures done at dialysis on Tuesday he is not been informed of  the results. 12/07/2018; the patient was admitted to hospital from 11/22/2018 through 12/02/2018. He underwent a left BKA. Apparently had staph sepsis. In the meantime being off the right foot this is closed over. He follows up with Dr. 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 Subsequent to this he actually had multiple falls in fact he tells me to following which calls the wound to be his apparently. He has been seeing Dr. [name] and his physician assistant in the interim. They actually did want him to come back for reevaluation to see if there's anything we can do for me wound care perspective the help this area to heal more appropriately. The patient states that he does not have a tremendous amount of pain which is good news. No fevers, chills, nausea, or vomiting noted at this time. We have gotten approval from Dr. [name] office had [place] for [place] to treat the patient for his stop wound. This is due to the fact that the patient was in the 90 day postop global. 1/21; the patient does not have an open wound on the right foot although he does have some pressure areas that will need to be padded when he is transferring. The dehisced surgical wound looks clean although there is some undermining of 1.5 cm superiorly. 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 This appears healthy but there is undermining. TheraSkin #1 2/11; TheraSkin #2 2/25; TheraSkin #3. Wound is measuring smaller 3/10; TheraSkin #4 wound is measuring smaller 3/24; TheraSkin #5 wound is measuring smaller and looks healthy. 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 He has no open area on the right foot although he does have some callused area over the original third metatarsal head wound. The third  metatarsal is also subluxed on the right foot. I have warned him today that he cannot consider wearing a prosthesis for at least a month but he can go for measurements. He is going to need to keep the right foot padded is much as possible in his diabetic shoe indefinitely READMISSION 06/12/2019 Mr. Threats is a type II diabetic on dialysis. He has been in this clinic multiple times with wounds on his bilateral lower extremities. 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 He also has a history of wounds on his right foot but tells me that everything is going well here. He actually came in here with his prosthesis walking with a cane. We were all really quite gratified to see this He states over the last several weeks he has had a painful area at the tip of the left third finger. His dialysis shunt  in his is in the left upper arm and this particularly hurts during dialysis when his fingers get numb and there is a lot of pain in the tip of his left third finger. I believe he is seen vascular surgery. He had a Doppler done to evaluate for a dialysis steal syndrome. He is going for a banding procedure 1 week from today towards the left AV fistula. I think if that is unsuccessful they will be looking at creating a new shunt. He had a course of doxycycline when he which he finished about a week ago. He has been using Bactroban to the finger 6/25; the patient had his banding procedure and things seem to be going better. He is not having pain in his hand at dialysis. The area on the tip of his finger seems about closed. He did however have a paronychia I the last time I saw him. I have been advising him to use topical Bactroban washing the finger. Culture I did last time showed a few staph epidermidis which I was willing to dismiss as superficial skin contaminant. He arrives today with the finger wound  looking better however the paronychia a seems worse 7/9; 2-week follow-up. He does not have an open wound on the tip of his third left finger. I thought he had an initial ischemic wound on the tip of the finger as well as a paronychia a medially. All of this appears to be closed. 8/6-Patient returns after 3 weeks for follow-up and has a right second met head plantar callus with a significant area of maceration hiding an ulcer ABI repeated today on right - 1.26 8/13-Patient returns at 1 week, the right second met head plantar wound appears to be slightly better, it is surrounded by the callus area we are using silver alginate 8/20; the patient came back to clinic 2 weeks ago with a wound on the right second metatarsal head. He apparently told me he developed callus in this area but also went away from his custom shoes to a pair of running shoes. Using silver alginate. 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 There might be much we can do to offload this although the patient tells me he has a wheelchair that he is using to try and stay off the wound is much as possible 8/27; right second metatarsal head. Still small punched out area with thick callus and subcutaneous tissue around the wound. We have been using silver collagen. This is always been an issue with this man's wound especially on this foot 9/3; right second met head. Still the same punched-out area with thick callus and subcutaneous tissue around the wound removing the circumference demonstrates repetitively undermining area. I have removed all the subcutaneous tissue associated with this. We have been using silver collagen 9/15; right second metatarsal head. Same punched-out thick callus and subcutaneous tissue around the wound area. Still requiring debridement we have been using silver collagen I changed back to silver alginate X-ray last time showed no evidence of osteomyelitis. Culture showed Klebsiella and he  was started and Keflex apparently just 4 days ago 9/24; right second metatarsal head. He is completed the antibiotics I gave him for 10 days. Same punched-out thick callus and subcutaneous tissue around the area. Still requiring debridement. We have been using silver alginate. The area itself looks swollen and this could be just [place] that comes on this area with walking on a prosthesis on the left however I think  he needs an MRI and I am going to order that today. There is not an option to offload this further 10/1; right second plantar metatarsal head. MRI booked for October 6. Generally looking better today using silver alginate 10/8; right second plantar metatarsal head. MRI that was done on 10/6 was negative for osteomyelitis noted prior amputations of the second and fourth toes at the MTP. Prior amputation of the third toe to the base of the middle phalanx. Prior amputation of the fifth toe to the head of the metatarsal. 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 He does not recall about hearing about this. He did not have any pain but then again he has reduced sensation from diabetic neuropathy 10/15; right second plantar metatarsal head. Still in the same condition callus nonviable subcutaneous tissue which cleans up nicely with debridement but reforms by the next week. The patient tells me that he is offloading this is much as he can including taking his wheelchair to dialysis. We have been using silver alginate, changed to silver collagen today 10/22; right second plantar metatarsal head. Wound measures smaller but still thick callus around this wound. Still requiring debridement 11/5 right second plantar metatarsal head. Less callus around the wound circumference but still requiring debridement. There is still some undermining which I cleaned out the wound looks healthy but still considerable punched out depth with a relatively small  circumference to the wound there is no palpable bone no purulent drainage 11/12; right second plantar metatarsal head. Small wound with thick callus tissue around the wound and significant undermining. We have been using silver collagen after my continuous debridements of this area 11/19; patient arrives in today with purulent drainage coming out of the wound in the second third met head area on the right foot. Serosanguineous thick drainage. Specimen obtained for culture. 11/21/2019 on evaluation today patient appears to be doing well with regard to his foot ulcer. The good news is is culture came back negative for any bacteria there was no growth. The other good news is his x-ray also appears to be doing well. The foot is also measuring much better than what it was. Overall I am very pleased with how things seem to be progressing. Electronic Signature(s) Signed: 11/21/2019 4:52:33 PM By: Worthy Keeler PA-C Entered By: Worthy Keeler on 11/21/2019 10:28:40 -------------------------------------------------------------------------------- Physical Exam Details Patient Name: Date of Service: TREVEN, [name] 11/21/2019 9:00 AM Medical Record [id number] Patient Account Number: [id number] Date of Birth/Sex: Treating RN: 27-Jan-1951 (68 y.o. M) Primary Care Provider: [name] Other Clinician: Referring Provider: Treating Provider/Extender:Stone III, Aundria Rud in Treatment: 57 Constitutional Well-nourished and well-hydrated in no acute distress. Respiratory normal breathing without difficulty. Psychiatric this patient is able to make decisions and demonstrates good insight into disease process. Alert and Oriented x 3. pleasant and cooperative. Notes Patient's wound bed currently showed signs of good granulation at this time there does not appear to be evidence of active infection and I did have to perform some sharp debridement to clear away slough from the  surface of the wound but post debridement the wound bed appears to be doing much better which is good news. No fevers, chills, nausea, vomiting, or diarrhea. Electronic Signature(s) Signed: 11/21/2019 4:52:33 PM By: Worthy Keeler PA-C Entered By: Worthy Keeler on 11/21/2019 10:29:56 -------------------------------------------------------------------------------- Physician Orders Details Patient Name: Date of Service: KEYANTE, DURIO 11/21/2019 9:00 AM Medical Record [id number] Patient Account  Number: [id number] Date of Birth/Sex: Treating RN: [birth year] (68 y.o. 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 - Thursday Dressing Change Frequency Wound #41 Right Metatarsal head second Change Dressing every other day. Skin Barriers/Peri-Wound Care Barrier cream - as needed for maceration, wetness, or redness. Wound Cleansing May shower and wash wound with soap and water. Primary Wound Dressing Wound #41 Right Metatarsal head second Calcium Alginate with Silver Secondary Dressing Wound #41 Right Metatarsal head second Kerlix/Rolled Gauze Dry Gauze Foam Border - patient may use bordered foam to cut into a felt to offload. Other: - felt to periwound and place one below wound. lightly wrap coban to hold dressing in place. felt / pad wound Edema Control Avoid standing for long periods of time Elevate legs to the level of the heart or above for 30 minutes daily and/or  when sitting, a frequency of: - throughout the day. Off-Loading Other: - felt to periwound and place one below wound. Additional Orders / Instructions Other: - minimize walking and standing on foot to aid in wound healing and callus buildup. Electronic Signature(s) Signed: 11/21/2019 3:14:28 PM By: Baruch Gouty RN, BSN Signed: 11/21/2019 4:52:33 PM By: Worthy Keeler PA-C Entered By: Baruch Gouty on 11/21/2019 10:26:06 -------------------------------------------------------------------------------- Problem List Details Patient Name: Date of Service: AMITAI, DELAUGHTER 11/21/2019 9:00 AM Medical Record [id number] Patient Account Number: [id number] Date of Birth/Sex: Treating RN: 04-Oct-1951 (68 y.o. 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, 06/12/2019 No Yes other extremity L03.115 Cellulitis of right lower limb 11/15/2019 No Yes Inactive Problems ICD-10 Code Description Active Date Inactive Date L03.114 Cellulitis of left upper limb 06/12/2019 06/12/2019 L98.498 Non-pressure chronic ulcer of skin of other sites with other 06/12/2019 06/12/2019 specified severity S61.209D Unspecified open wound of unspecified finger without damage 06/12/2019 06/12/2019 to nail, subsequent encounter Resolved Problems Electronic Signature(s) Signed: 11/21/2019 4:52:33 PM By: Worthy Keeler PA-C Entered By: Worthy Keeler on 11/21/2019 09:45:53 -------------------------------------------------------------------------------- Progress Note Details Patient  Name: Date of Service: [name], [name] 11/21/2019 9:00 AM Medical Record [id number] Patient Account Number: [id number] Date of Birth/Sex: Treating RN: 06/22/1951 (68 y.o. M) Primary Care Provider: [name] Other Clinician: Referring Provider: Treating Provider/Extender:Stone III, Aundria Rud in Treatment: 23 Subjective Chief Complaint Information obtained from Patient Left BKA ulcer 06/12/2019; patient is here for review of wound on the tip of the left third finger History of Present Illness (HPI) The following HPI elements were documented for the patient's wound: Location: Patient presents with a wound to bilateral feet. Quality: Patient reports experiencing essentially no pain. Severity: Mildly severe wound with no evidence of infection Duration: Patient has had the wound for greater than 2 weeks prior to presenting for treatment The patient is a pleasant 68  yrs old bm here for evaluation of ulcers on the plantar aspect of both feet. 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 He has a left arm graft for access. He has been trying to stay off his feet for weeks but does not seem to have any improvement in the wound. He has been seeing someone at the foot center and was referred to the Wound Care center for further evaluation. 12/30/15 the patient has 3 wounds one over the right first metatarsal head and 2 on the left foot at the left fifth and lleft first metatarsal head. All of these look relatively similar. The one over the left fifth probe to bone I could not prove that any of the others did. I note his MRI in November that did not show osteomyelitis. His peripheral pulses seem robust. All of these underwent surgical debridement to remove callus nonviable skin and subcutaneous tissue 01/06/16; the patient had his sutures removed from the right fifth ray amputation. There may be a small open part of this  superiorly but otherwise the incision looks good. Areas over his right first, left first and left fifth metatarsal head all underwent surgical debridement as a varying degree of callus, skin and nonviable subcutaneous tissue. The area that is most worrisome is the right fifth metatarsal head which has a wound probes precariously close to bone. There is no purulent drainage or erythema 01/13/16; I'm not exactly sure of the status of the right fifth ray amputation site however he follows with Dr. [name] later this week. The area over his right first plantar metatarsal head, left first and fifth plantar metatarsal head are all in the same status. Thick circumferential callus, nonviable subcutaneous tissue. Culture of the left fifth did not culture last week 01/20/16; all of the patient's wounds appear and roughly the same state although his amputation site on the right lateral foot looks better. His wounds over the right first, left first and left fifth metatarsal heads all underwent difficult surgical debridement removing circumferential callus nonviable skin and subcutaneous tissue. There is no overt evidence of infection in these areas. MRI at the end of December of the left foot did not show osteomyelitis, right foot showed osteomyelitis of the right fifth digit he is now status post amputation. 01/27/16 the patient's wounds over his plantar first and fifth metatarsal heads on the left all appear better having been started on a total contact cast last week. They were debridement of circumferential callus and nonviable subcutaneous tissue as was the wound over the first metatarsal head. His surgical incision on the right also had some light surface debridement done. 03/23/2016 -- the patient was doing really well and most of his wounds had almost completely healed but now he came back today with a history of having a discharge from the area of his right foot on the plantar aspect and also between his  first and second toe. He also has had some discharge from the left foot. Addendum: I spoke to the PA [name] at the dialysis center whose fax number is [phone]. We discussed the infection the patient has and she will put the patient on vancomycin and Fortaz until the final culture report is back. We will fax this as soon as available. 03/30/2016 -- left foot x-ray IMPRESSION:No definitive osteomyelitis noted. X-ray of the right foot -- IMPRESSION: 1. Soft tissue swelling. Prior amputation right fifth digit. No acute or focal bony abnormality  identified. If osteomyelitis remains a clinical concern, MRI can be obtained. 2. Peripheral vascular disease. His culture reports have grown an MSSA -- and we will fax this report to his hemodialysis center. He was called in a prescription of oral doxycycline but I have told her not to fill this in as he is already on IV antibiotics. 04/06/2016 -- a few days ago, I spoke to the hemodialysis center nurse who had stopped the IV antibiotics and he was given a prescription for doxycycline 100 mg by mouth twice a day for a week and he is on this at the present time. 05/11/2016 -- he has recently seen his PCP this week and his hemoglobin A1c was 7. He is working on his paperwork to get his orthotic shoes. 05/25/2016 -- -- x-ray of the left foot IMPRESSION:No acute bony abnormality. No radiographic changes of acute osteomyelitis. No change since prior study. X-ray of the right foot -- IMPRESSION: Postsurgical changes are seen involving the fifth toe. No evidence of acute osteomyelitis. he developed a large blister on the medial part of his right foot and this opened out and drain fluid. 06/01/2016 -- he has his MRI to be done this afternoon. 06/15/2016 -- MRI of the left forefoot without contrast shows 2 separate regions of cutaneous and subcutaneous edema and possible ulceration and blistering along the ball of the foot. No obvious osteomyelitis  identified. MRI of the right foot showed cutaneous and subcutaneous thickening plantar to the first digit sesamoid with an ulcer crater but no underlying osteomyelitis is identified. 06/22/2016 -- the right foot plantar ulcer has been draining a lot of seropurulent material for the last few days. 06/30/2016 -- spoke to the dialysis center and I believe I spoke to Darwin a PA at the center who discussed with me and agreed to putting Legrand Como on vancomycin until his cultures arrive. On review of his culture report no WBCs were seen or no organisms were seen and the culture was reincubated for better growth. The final report is back and there were no predominant growth including Streptococcus or Staphylococcus. Clinically though he has a lot of drainage from both wounds a lot of undermining and there is further blebs on the left foot towards the interspace between his first and second toe. 08/10/2016 -- the culture from the right foot showed normal skin flora and there was no Staphylococcus aureus was group A streptococcus isolated. 09/21/2016 -- -- MRI of the right foot was done on 09/13/2016 - IMPRESSION: Findings most consistent with acute osteomyelitis throughout the great toe, sesamoid bones and plantar aspect of the head of the first metatarsal. Fluid in the sheath of the flexor tendon of the great toe could be sympathetic but is worrisome for septic tenosynovitis. First MTP joint effusion worrisome for septic joint. He was admitted to the hospital on 09/11/2016 and treated for a fever with vancomycin and cefepime. He was seen by Dr. Bobby Rumpf of infectious disease who recommended 6 weeks treatment with vancomycin and ceftazidime with his hemodialysis and to continue to see as in the wound clinic. Vascular consult was pending. The patient was discharged home on 09/14/2016 and he would continue with IV antibiotics for 6 weeks. 09/28/16 wound appears reasonably healthy. Continuing with  total contact cast 10/05/16 wound is smaller and looking healthy. Continue with total contact cast. He continues on IV antibiotics 10/12/2016 -- he has developed a new wound on the dorsal aspect of his left big toe and this is a superficial injury  with no surrounding cellulitis. He has completed 30 days of IV antibiotics and is now ready to start his hyperbaric oxygen therapy as per his insurance company's recommendation. 10/19/2016 -- after the cast was removed on the right side he has got good resolution of his ulceration on the right plantar foot. he has a new wound on the left plantar foot in the region of his fourth metatarsal and this will need sharp debridement. 10/26/2016 -- Xray of the right foot complete: IMPRESSION: Changes consistent with osteomyelitis involving the head of the first metatarsal and base of the first proximal phalanx. The sesamoid bones are also likely involved given their positioning. 11/02/2016 -- there still awaiting insurance clearance for his hyperbaric oxygen therapy and hopefully he will begin treatment soon. 11/09/2016 -- started with hyperbaric oxygen therapy and had some barotrauma to the right ear and this was seen by ENT who was prescribed Afrin drops and would probably continue with HBO and he is scheduled for myringotomy tubes on Friday 11/16/2016 -- he had his myringotomy tubes placed on Friday and has been doing much better after that with some fluid draining out after hyperbaric treatment today. The pain was much better. 11/30/2016 -- over the last 2 days he noticed a swelling and change of color of his right second toe and this had been draining minimal fluid. 12/07/2016 -- x-ray of the right foot -- IMPRESSION:1. Progressive ulceration at the distal aspect of the second digit with significant soft tissue swelling and osseous changes in the distal phalanx compatible with osteomyelitis. 2. Chronic osteomyelitis at the first MTP joint. 3.  Ulcerations at the second and third toes as well without definite osseous changes. Osteomyelitis is not excluded. 4. Fifth digit amputation. On 12/01/2016 oo I spoke to Dr. Bobby Rumpf, the infectious disease specialist, who kindly agreed to treat this with IV antibiotics and he would call in the order to the dialysis center and this has been discussed in detail with the patient who will make the appropriate arrangements. The patient will also book an appointment as soon as possible to see Dr. [name] in the office. Of note the patient has not been on antibiotics this entire week as the dialysis center did not receive any orders from Dr. [name]. I got in touch with Dr. [name] who tells me the patient has an appointment to see him this coming Wednesday. 12/14/2016 -- the patient is on ceftazidime and vancomycin during his dialysis, and I understand this was put on by his nephrologist Dr. Raliegh Ip possibly after speaking with infectious disease Dr. [name] 12/23/16; patient was on my schedule today for a wound evaluation as he had difficulties with his schedule earlier this week. I note that he is on vancomycin and ceftazidine at dialysis. In spite of this he arrives today with a new wound on the base of the right second toe this easily probes to bone. The known wound at the tip of the second toe With this area. He also has a superficial area on the medial aspect of the third toe on the side of the DIP. This does not appear to have much depth. The area on the plantar left foot is a deep area but did not probe the bone 12/28/16 -- he has brought in some lab work and the most recent labs done showed a hemoglobin of 12.1 hematocrit of 36.3, neutrophils of 51% WBC count of 5.6, BN of 53, albumin of 4.1 globulin of 3.7, vancomycin 13 g per mL. 01/04/2017 --  he saw Dr. [name] who has recommended a amputation of the right second toe and he is awaiting this date. He sees Dr. Bobby Rumpf of infectious  disease tomorrow. The bone culture taken on 12/28/2016 had no growth in 2 days. 01/11/2017 -- was seen by Dr. Bobby Rumpf regarding the management and has recommended a eval by vascular surgeons. He recommended to continue the antibiotics during his hemodialysis. Xray of the left foot -- IMPRESSION: No acute fracture, dislocation, or osseous erosion identified. 01/18/2017 -- he has his vascular workup later today and he is going to have his right foot second toe amputation this coming Friday by Dr. [name]. We have also put in for a another 30 treatments with hyperbaric oxygen therapy 01/25/2017 -- I have reviewed Dr. Donnetta Hutching is vascular report from last week where he reviewed him and thought his vascular function was good enough to heal his amputation site and no further tests were recommended. He also had his orthopedic related to surgery which is still pending the notes and we will review these next week. 02/01/2017 -- the operative remote of Dr. 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 02/08/2017 --he was seen by Dr. [name] on 02/03/2017 -- patient is on Pearl. after a thorough review he had recommended to continue with antibiotics during hemodialysis. The patient was seen by Dr. [name], but I do not find any follow-up note on the electronic medical record. he has removed the dressing over the right foot to remove the sutures and asked him to see him back in a month's time 02/22/2017 -- patient has been febrile and has been having symptoms of the upper respiratory tract infection but has not been checked for the flu and it's  been over 4 days now. He says he is feeling better today. Some drainage between his left first and second toe and this needed to be looked at 03/08/2017 -- was seen by Dr. Bobby Rumpf on 03/07/2017 after review he will stop his antibiotics and see him back in a month to see if he is feeling well. 03/15/2017 -- he has completed his course of hyperbaric oxygen therapy and is doing fine with his health otherwise. 03/22/2017 -- his nutritionist at the dialysis center has recommended a protein supplement to help build his collagen and we will prescribe this for him when he has the details 05/03/2017 -- he recently noticed the area on the plantar aspect of his fifth metatarsal head which opened out and had minimal drainage. 05/10/2017 -- -- x-ray of the left foot -- IMPRESSION:1. No convincing conventional radiographic evidence of active osteomyelitis.2. Active soft tissue ulceration at the tip of the fifth digit, and at the lateral aspect of the foot adjacent the base of the fifth metatarsal. 3. Surgical changes of prior fourth toe amputation. 4. 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 5. Small vessel atherosclerotic vascular calcifications. 6. 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 ======= Readmission after 5 weeks: 06/15/2017 -- the patient returns after 5 weeks having had an MRI done on 05/17/2017 MRI of the left foot without contrast showed soft tissue ulcer  overlying the base of the fifth metatarsal. 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 No drainable fluid collection to suggest an abscess. He was in hospital between 05/16/2017 and 05/25/2017 -- and on discharge was asked to follow-up with Dr. [name] and Dr. [name]. He was treated 2 weeks post discharge with vancomycin plus ceftriaxone with hemodialysis and oral metronidazole 500 mg 3 times a day. The  patient underwent a left fifth ray amputation and excision on 5/23, but continued to have postoperative spikes of fever. last hemoglobin A1c was 6.7% He had a postoperative MR of the foot on 05/23/2017 -- which showed no new areas of cortical bone loss, edema or soft tissue ulceration to suggest osteomyelitis. the patient has completed his course of IV antibiotics during dialysis and is to see the infectious disease doctor tomorrow. Dr. [name] had seen him after suture removal and asked him to keep the wound with a dry dressing 06/21/2017 -- he was seen by Dr. Bobby Rumpf of infectious disease on 06/16/2017 -- he stopped his Flagyl and will see him back in 6 weeks. He was asked to follow-up with Dr. [name] and with the wound clinic clinic. 07/05/17; bone biopsy from last time showed acute osteomyelitis. This is from the reminiscent in the left fifth metatarsal. Culture result is apparently still pending [holding for anaerobe}. From my understanding in this case this is been a progressive necrotic wound which is deteriorated markedly over the last 3 weeks since he returned here. He now has a large area of exposed bone which was biopsied and cultured last week. Dr. Graylon Good has put him on vancomycin and Fortaz during his hemodialysis and Flagyl orally. He is to see Dr. [name] next week 07/19/2017 -- the patient was reviewed by Dr. [name] of orthopedics who reviewed the case in detail and agreed with the plan to continue with IV antibiotics, aggressive wound care and hyperbaric oxygen therapy. He would see him back in 3 weeks' time 08/09/2017 -- saw Dr. Bobby Rumpf on 08/03/2017 -he was restarted on his antibiotics for 6 weeks which included vanco, ceftaz and Flagyl. he recommended continuing his antibiotics for 6 weeks and reevaluate his completion date. He would continue with wound care and hyperbaric oxygen therapy. 08/16/2017 -- I understand he will be completing his 6 weeks of antibiotics  sometime later this week. 09/19/2017 -- he has been without his wound VAC for the last week due to lack of supplies. He has been packing his wound with silver alginate. 10/04/2017 -- he has an appointment with Dr. [name] tomorrow and hence we will not apply the wound VAC after his dressing changes today. 10/11/2017 -- he was seen by Dr. Bobby Rumpf on 10/05/2017, noted that the patient was currently off antibiotics, and after thorough review he recommended he follow-up with the wound care as per plan and no further antibiotics at this point. 11/29/17; patient is arrived for the wound on his left lateral foot.Marland Kitchen He is completed IV antibiotics and 2 rounds of hyperbaric oxygen for treatment of underlying osteomyelitis. He arrives today with a surface on most of the wound area however our intake nurse noted drainage from the superior aspect. This was brought to my attention. 12/13/2017 -- the right plantar foot had a large bleb and once the bleb was opened out a large callus and subcutaneous debris was removed and he has a plantar ulcer near the fourth metatarsal head 12/28/17 on evaluation today patient appears to be doing acutely worse  in regard to his left foot. The wound which has been appearing to do better as now open up more deeply there is bone palpable at the base of the wound unfortunately. He tells me that this feels "like it did when he had osteomyelitis previously" he also noted that his second toe on the left foot appears to be doing worse and is swollen there does appear to be some fluid collected underneath. His right foot plantar ulcer appears to be doing somewhat better at this point and there really is no complication at the site currently. No fevers, chills, nausea, or vomiting noted at this time. Patient states that he normally has no pain at this site however. Today he is having significant pain. 01/03/18; culture done last week showed methicillin sensitive staph aureus and  group B strep. He was on Septra however he arrives today with a fever of 101. He has a functional dialysis shunt in his left arm but has had no pain here. No cough. He still makes some urine no dysuria he did have abdominal pain nausea and diarrhea over the weekend but is not had any diarrhea since yesterday. Otherwise he has no specific complaints 01/05/18; the patient returns today in follow-up for his presentation from 1/8. At that point he was febrile. I gave him some Levaquin adjusted for his dialysis status. He tells me the fever broke that night and it is not likely that this was actually a wound infection. He is gone on to have an MRI of the left foot; this showed interval development of an abnormal signal from the base of the third and fourth metatarsal and in the cuboid reminiscent consistent with osteomyelitis. There is no mention of the left second toe I think which was a concern when it was ordered. The patient is taking his Levaquin 01/10/18; the patient has completed his antibiotics today. The area on the left lateral foot is smaller but it still probes easily to bone. He has underlying osteomyelitis here and sees Dr. Megan Salon of infectious disease this week ooThe area on the right third plantar metatarsal head still requires debridement not much change in dimensions [facility] has a new wound on the medial tip of his right third toe again this probes to bone. He only noticed this 2 days ago 01/17/18; the patient saw Dr. [name] last week and he is back on Vanco and Fortaz at dialysis. 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 We are previously seeing him for a wound also on the base of the fourth med head on the right. X- ray of this area did not show osteomyelitis in relation to a new wound on the tip of his right third toe. Again this probes to bone. Finally his second toe  on the left which didn't really show anything on the MRI at least no report appears to have a separated cutaneous area which I think is going to come right off the tip of his toe and leave exposed bone. Whether this is infectious or ischemic I am not clear Culture I did from the third toe last week which was his new wound was negative. Plain x-ray on the right foot did not show osteomyelitis in the toes 01/24/18; the patient is going went to see Dr. [name]. He is going to have a amputation of the left second and right third toe although  paradoxically the left second toe looks better than last week. We have treating a deep probing wound on the left lateral foot and the area over the third metatarsal head on the right foot. 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 Also a debridement including bone of the left lateral foot and a closure which is still sutured. This is a bit surprising. Also apparent debridement of the base of the right third toe wound. 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 I'm not sure exactly where we are in the frame of things here. The wound on the left lateral foot is still sutured also the amputation sites of the left second and right third toe. He is been using Xeroform to the sutured areas silver alginate to the right foot 02/14/18; he continues on Rocephin at dialysis for osteomyelitis. I still don't have a good sense of where we are in the treatment duration. The wounds on the left lateral foot had the sutures removed and this clearly still probes deeply. We debrided the area with a #5 curet. We'll use silver alginate to the wound oothe area over the right third met head also required debridement of callus skin and subcutaneous tissue  and necrotic debris over the wound surface. This tunnels superiorly but I did not unroofed this today. ooThe areas on the tip of his toes have a crusted surface eschar I did not debridement this either. 02/21/18; he continues on Rocephin at dialysis for osteomyelitis. I don't have a good sense of where we are and the treatment duration. The wounds on the left lateral foot is callused over on presentation but requires debridement. The area on the right third med head plantar aspect actually is measuring smaller 02/28/18;patient is on Vanco and Fortaz at dialysis, I'm not sure why I thought this was Rocephin above unless it is been changed. I don't have a good sense of time frame here. Using silver collagen to the area on the left lateral foot and right third med head plantar foot 03/07/18; patient is completing bank and Fortaz at dialysis soon. He is using Silver collagen to the area on the left lateral foot and the right third metatarsal head. He has a smaller superficial wound distal to the left lateral foot wound. 03/14/18-he is here in follow-up evaluation for multiple ulcerations to his bilateral feet. He presents with a new ulceration to the plantar aspect of the left foot underneath the blister, this was deroofed to reveal a partial thickness ulcer. He is voicing no complaints or concerns, tolerated dialysis yesterday. We will continue with same treatment plan and he will follow-up next week 03/21/18; the patient has a area on the lateral left foot which still has a small probing area. The overall surface area of the wound is better. He presented with a new ulceration on the left foot plantar fifth metatarsal head last week. The area on the right third metatarsal head appears smaller.using silver alginate to all wound areas. The patient is changing the dressing himself. He is using a Darco forefoot offloading are on the right and a healing sandal on the left 03/28/18; original wound left lateral  foot. Still nowhere close to looking like a heeling surface oosecondary wound on the left lateral foot at the  base of his question fifth metatarsal which was new last week ooRight third metatarsal head. ooWe have been using silver alginate all wounds 04/04/18; the original wound on the left lateral foot again heavy callus surrounding thick nonviable subcutaneous tissue all requiring debridement. The new wound from 2 weeks ago just near this at the base of the fifth metatarsal head still looks about the same ooUnfortunately there is been deterioration in the third medical head wound on the right which now probes to bone. I must say this was a superficial wound at one point in time and I really don't have a good frame of reference year. I'll have to go back to look through records about what we know about the right foot. 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 He is also been to see Dr. [name]. Somewhere in here as somebody is ordered a VASCULAR evaluation which was done on 03/22/18. On the right is anterior tibial artery was monophasic triphasic at the posterior tibial artery. On the left anterior tibial artery monophasic posterior tibial artery monophasic. ABI in the right was 1.43 on the left 1.21. TBIs on the right at 0.41 and on the left at 0.32. A vascular consult was recommended and I think has been arranged. 04/11/18; Mr. Tiberio has not had an MRI of the right foot since 2017. Recent x-ray of the right foot done in January and February was negative. However he has had a major deterioration in the wound over the third met head. He is completed antibiotics last week at dialysis [place]. I think he is going to see vascular in follow-up. The areas on the left lateral foot and left plantar fifth metatarsal head both look satisfactory. I debrided both of these areas although the tissue  here looks good. The area on the left lateral foot once probe to bone it certainly does not do that now 04/18/18; MRI of the right foot is on Thursday. He has what looks to be serosanguineous purulent drainage coming out of the wound over the right third metatarsal head today. He completed antibiotics bank and Fortaz 2 weeks ago at dialysis. He has a vascular follow-up with regards to his arterial insufficiency although I don't exactly see when that is booked. The areas on the left foot including the lateral left foot and the plantar left fifth metatarsal head look about the same. 04/25/18-He is here in follow-up evaluation for bilateral foot ulcers. MRI obtained was negative for osteomyelitis. Wound culture was negative. We will continue with same treatment plan he'll follow-up next week 05/02/18; the right plantar foot wound over the third metatarsal head actually looks better than when I last saw this. His MRI was negative for osteomyelitis wound culture was negative. ooOn the left plantar foot both wounds on the plantar fifth metatarsal head and on the lateral foot both are covered in a very hard circumferential callus. 05/09/18; right plantar foot wound over the third metatarsal head stable from last week. ooLeft plantar foot wound over the fifth metatarsal head also stable but with callus around both wound areas ooThe area on the left lateral foot had thick callus over a surface and I had some thoughts about leaving this intact however it felt boggy. ooWe've been using silver alginate all wounds 05/16/18; since the patient was last here he was hospitalized from 5/15 through 5/19. 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 This grew MRSA. He was placed on vancomycin.MRI of the left foot suggested "progressive" osteomyelitis and bone destruction of the cuboid and the base of the fifth  metatarsal. Stable erosive changes of the base of the second metatarsal. I'm wondering if they're aware that he had surgery and debridement in the area of the underlying bone previously by Dr. [name]. In any case, He was also revascularized with an angioplasty and stenting of the left tibial peroneal trunk and angioplasty of the left posterior tibial artery. He is on vancomycin at dialysis. He has a 2 week follow-up with infectious disease. He has vascular surgery follow-up. He was started on Plavix. 05/23/18; the patient's wound on the lateral left foot at the level of the fifth metatarsal head and the plantar wound on the plantar fifth metatarsal head both look better. The area on the right third metatarsal head still has depth and undermining. We've been using silver alginate. He is on vancomycin. He has been revascularized on the left 05/30/18; he continues on vancomycin at dialysis. Revascularized on the left. The area on the left lateral foot is just about closed. Unfortunately the area over the plantar fifth metatarsal head undermining superiorly as does the area over the right third metatarsal head. Both of these significantly deteriorated from last week. He has appointments next week with infectious disease and orthopedics I delayed putting on a cast on the left until those appointments which therefore we made we'll bring him in on Friday the 14th with the idea of a cast on the left foot 06/06/18; he continues on vancomycin at dialysis. Revascularized on the left. He arrives today with a ballotable swelling just above the area on the left lateral foot where his previous wound was. He has no pain but he is reasonably insensate. The difficult areas on the plantar left fifth metatarsal head looks stable whereas the area on the right third plantar metatarsal head about the same as last week there is undermining here although I did not unroofed this today. He required a considerable debridement of  the swelling on the left lateral foot area and I unroofed an abscess with a copious amount of brown purulent material which I obtained for culture. I don't believe during his recent hospitalization he had any further imaging although I need to review this. Previous cultures from this area done in this clinic showed MRSA, I would be surprised if this is not what this is currently even though he is on vancomycin. 06/13/18; he continues on vancomycin at dialysis however he finishes this on Sunday and then graduates the doxycycline previously prescribed by Dr. [name]. The abscess site on the lateral foot that I unroofed last time grew moderate amounts of methicillin-resistant staph aureus that is both vancomycin and tetracycline sensitive so we should be okay from that regard. He arrives today in clinic with a connection between the abscess site and the area on the lateral foot. We've been using silver alginate to all his wound areas. The right third metatarsal head wound is measuring smaller. Using silver alginate on both wound areas 06/20/18; transitioning to doxycycline prescribed by Dr. [name]. The abscess site on the lateral foot that I unroofed 2 weeks ago grew MRSA. That area has largely closed down although the lateral part of his wound on the fifth metatarsal head still probes to bone. I suspect all of this was connected. ooOn the right  third metatarsal head smaller looking wound but surrounded by nonviable tissue that once again requires debridement using silver alginate on both areas 06/27/18; on doxycycline 100 twice a day. The abscess on the lateral foot has closed down. Although he still has the wound on the left fifth metatarsal head that extends towards the lateral part of the foot. The most lateral part of this wound probes to bone ooRight third metatarsal head still a deep probing wound with undermining. Nevertheless I elected not to debridement this this week ooIf everything is  copious static next week on the left I'm going to attempt a total contact cast 07/04/18; he is tolerating his doxycycline. The abscess on the left lateral foot is closed down although there is still a deep wound here that probes to bone. We will use silver collagen under the total contact cast Silver collagen to the deep wound over the right third metatarsal head is well 07/11/18; he is tolerating doxycycline. The left lateral fifth plantar metatarsal head still probes deeply but I could not probe any bone. We've been using silver collagen and this will be the second week under the total contact cast ooSilver collagen to deep wound over the right third metatarsal head as well 07/18/18; he is still taking doxycycline. The left lateral fifth plantar metatarsal this week probes deeply with a large amount of exposed bone. Quite a deterioration. He required extensive debridement. Specimens of bone for pathology and CNS obtained ooAlso considerable debridement on the right third metatarsal head 07/25/18; bone for pathology last week from the lateral left foot showed osteomyelitis. CandS of the bone Enterobacter. And Klebsiella. He is now on ciprofloxacin and the doxycycline is stopped [Dr. [name] of infectious disease] area He has an appointment with Dr. [name] tomorrow I'm going to leave the total contact cast off for this week. May wish to try to reapply that next week or the week after depending on the wound bed looks. I'm not sure if there is an operative option here, previously is followed with Dr. [name] 08/01/18 on evaluation today patient presents for reevaluation. He has been seen by infectious disease, Dr. [name], and he has placed the patient back on Ceftaz currently to be given to him at dialysis three times a week. 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 Fortunately there does not appear to be the evidence of infection  at this point in time. Overall the patient has been tolerating the dressing changes without complication. We are no longer due lives in the cast of left foot secondary to the infection obviously. 08/10/18 on evaluation today patient appears to be doing rather well all things considered in regard to his left plantar foot ulcer. He does have a significant callous on the lateral portion of his left foot he wonders if I can help clean that away to some degree today. Fortunately he is having no evidence of infection at this time which is good news. No fevers chills noted. With that being said his right plantar foot actually does have a significant callous buildup around the wound opening this seems to not be doing as well as I would like at this point. I do believe that he would benefit from sharp debridement at this site. Subsequently I think a total contact cast would be helpful for the right foot as well. 08/17/18; the patient arrived today with a total contact cast on the right foot. This actually  looks quite good. He had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

## 2019-11-23 DIAGNOSIS — E1121 Type 2 diabetes mellitus with diabetic nephropathy: Secondary | ICD-10-CM | POA: Diagnosis not present

## 2019-11-23 DIAGNOSIS — Z992 Dependence on renal dialysis: Secondary | ICD-10-CM | POA: Diagnosis not present

## 2019-11-23 DIAGNOSIS — N2581 Secondary hyperparathyroidism of renal origin: Secondary | ICD-10-CM | POA: Diagnosis not present

## 2019-11-23 DIAGNOSIS — N186 End stage renal disease: Secondary | ICD-10-CM | POA: Diagnosis not present

## 2019-11-26 DIAGNOSIS — N186 End stage renal disease: Secondary | ICD-10-CM | POA: Diagnosis not present

## 2019-11-26 DIAGNOSIS — E1121 Type 2 diabetes mellitus with diabetic nephropathy: Secondary | ICD-10-CM | POA: Diagnosis not present

## 2019-11-26 DIAGNOSIS — Z992 Dependence on renal dialysis: Secondary | ICD-10-CM | POA: Diagnosis not present

## 2019-11-26 DIAGNOSIS — N2581 Secondary hyperparathyroidism of renal origin: Secondary | ICD-10-CM | POA: Diagnosis not present

## 2019-11-29 ENCOUNTER — Encounter (HOSPITAL_BASED_OUTPATIENT_CLINIC_OR_DEPARTMENT_OTHER): Payer: Medicare Other | Admitting: Internal Medicine
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
## 2019-12-04 NOTE — Progress Notes (Signed)
STERLIN, [name] ([id number]) Visit Report for 09/11/2019 Debridement Details Patient Name: Date of Service: [name], [name] 09/11/2019 10:15 AM Medical Record [id number] Patient Account Number: [id number] Date of Birth/Sex: 1951/11/03 (67 y.o. M) Treating RN: Primary Care Provider: [name] Other Clinician: Referring Provider: Treating Provider/Extender:Starletta Houchin, Birdena Crandall in Treatment: 13 Debridement Performed for Wound #41 Right Metatarsal head second Assessment: Performed By: Physician Ricard Dillon., MD Debridement Type: Debridement Severity of Tissue Pre Fat layer exposed Debridement: Level of Consciousness (Pre- Awake and Alert procedure): Pre-procedure Verification/Time Out Taken: Yes - 11:45 Start Time: 11:45 Pain Control: Lidocaine 5% topical ointment Total Area Debrided (L x W): 0.3 (cm) x 0.3 (cm) = 0.09 (cm) Tissue and other material Viable, Non-Viable, Callus, Slough, Subcutaneous, Slough debrided: Level: Skin/Subcutaneous Tissue Debridement Description: Excisional Instrument: Curette Bleeding: Minimum Hemostasis Achieved: Pressure End Time: 11:48 Procedural Pain: 0 Post Procedural Pain: 0 Response to Treatment: Procedure was tolerated well Level of Consciousness Awake and Alert (Post-procedure): Post Debridement Measurements of Total Wound Length: (cm) 0.3 Width: (cm) 0.3 Depth: (cm) 0.8 Volume: (cm) 0.057 Character of Wound/Ulcer Post Improved Debridement: Severity of Tissue Post Debridement: Fat layer exposed Post Procedure Diagnosis Same as Pre-procedure Electronic Signature(s) Signed: 09/11/2019 6:19:19 PM By: Linton Ham MD Entered By: Linton Ham on 09/11/2019 12:51:30 -------------------------------------------------------------------------------- HPI Details Patient Name: Date of Service: [name] 09/11/2019 10:15 AM Medical Record [id number] Patient Account Number:  [id number] Date of Birth/Sex: Treating RN: Aug 25, 1951 (68 y.o. M) Primary Care Provider: [name] Other Clinician: Referring Provider: Treating Provider/Extender:Xylon Croom, Jeronimo Greaves, Buena Irish in Treatment: 13 History of Present Illness Location: Patient presents with a wound to bilateral feet. Quality: Patient reports experiencing essentially no pain. Severity: Mildly severe wound with no evidence of infection Duration: Patient has had the wound for greater than 2 weeks prior to presenting for treatment HPI Description: The patient is a pleasant 68 yrs old bm here for evaluation of ulcers on the plantar aspect of both feet. 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 He has a left arm graft for access. He has been trying to stay off his feet for weeks but does not seem to have any improvement in the wound. He has been seeing someone at the foot center and was referred to the Wound Care center for further evaluation. 12/30/15 the patient has 3 wounds one over the right first metatarsal head and 2 on the left foot at the left fifth and lleft first metatarsal head. All of these look relatively similar. The one over the left fifth probe to bone I could not prove that any of the others did. I note his MRI in November that did not show osteomyelitis. His peripheral pulses seem robust. All of these underwent surgical debridement to remove callus nonviable skin and subcutaneous tissue 01/06/16; the patient had his sutures removed from the right fifth ray amputation. There may be a small open part of this superiorly but otherwise the incision looks good. Areas over his right first, left first and left fifth metatarsal head all underwent surgical debridement as a varying degree of callus, skin and nonviable subcutaneous tissue. The area that is most worrisome is the right fifth metatarsal head which has a wound probes precariously close to  bone. There is no purulent drainage or erythema 01/13/16; I'm not exactly sure of the status of the right fifth ray amputation site however he follows with Dr. [name] later this  week. The area over his right first plantar metatarsal head, left first and fifth plantar metatarsal head are all in the same status. Thick circumferential callus, nonviable subcutaneous tissue. Culture of the left fifth did not culture last week 01/20/16; all of the patient's wounds appear and roughly the same state although his amputation site on the right lateral foot looks better. His wounds over the right first, left first and left fifth metatarsal heads all underwent difficult surgical debridement removing circumferential callus nonviable skin and subcutaneous tissue. There is no overt evidence of infection in these areas. MRI at the end of December of the left foot did not show osteomyelitis, right foot showed osteomyelitis of the right fifth digit he is now status post amputation. 01/27/16 the patient's wounds over his plantar first and fifth metatarsal heads on the left all appear better having been started on a total contact cast last week. They were debridement of circumferential callus and nonviable subcutaneous tissue as was the wound over the first metatarsal head. His surgical incision on the right also had some light surface debridement done. 03/23/2016 -- the patient was doing really well and most of his wounds had almost completely healed but now he came back today with a history of having a discharge from the area of his right foot on the plantar aspect and also between his first and second toe. He also has had some discharge from the left foot. Addendum: I spoke to the PA [name] at the dialysis center whose fax number is [phone]. We discussed the infection the patient has and she will put the patient on vancomycin and Fortaz until the final culture report is back. We will fax this as soon  as available. 03/30/2016 -- left foot x-ray IMPRESSION:No definitive osteomyelitis noted. X-ray of the right foot -- IMPRESSION: 1. Soft tissue swelling. Prior amputation right fifth digit. No acute or focal bony abnormality identified. If osteomyelitis remains a clinical concern, MRI can be obtained. 2. Peripheral vascular disease. His culture reports have grown an MSSA -- and we will fax this report to his hemodialysis center. He was called in a prescription of oral doxycycline but I have told her not to fill this in as he is already on IV antibiotics. 04/06/2016 -- a few days ago, I spoke to the hemodialysis center nurse who had stopped the IV antibiotics and he was given a prescription for doxycycline 100 mg by mouth twice a day for a week and he is on this at the present time. 05/11/2016 -- he has recently seen his PCP this week and his hemoglobin A1c was 7. He is working on his paperwork to get his orthotic shoes. 05/25/2016 -- -- x-ray of the left foot IMPRESSION:No acute bony abnormality. No radiographic changes of acute osteomyelitis. No change since prior study. X-ray of the right foot -- IMPRESSION: Postsurgical changes are seen involving the fifth toe. No evidence of acute osteomyelitis. he developed a large blister on the medial part of his right foot and this opened out and drain fluid. 06/01/2016 -- he has his MRI to be done this afternoon. 06/15/2016 -- MRI of the left forefoot without contrast shows 2 separate regions of cutaneous and subcutaneous edema and possible ulceration and blistering along the ball of the foot. No obvious osteomyelitis identified. MRI of the right foot showed cutaneous and subcutaneous thickening plantar to the first digit sesamoid with an ulcer crater but no underlying osteomyelitis is identified. 06/22/2016 -- the right foot plantar ulcer  has been draining a lot of seropurulent material for the last few days. 06/30/2016 -- spoke to the dialysis  center and I believe I spoke to Thomson a PA at the center who discussed with me and agreed to putting Legrand Como on vancomycin until his cultures arrive. On review of his culture report no WBCs were seen or no organisms were seen and the culture was reincubated for better growth. The final report is back and there were no predominant growth including Streptococcus or Staphylococcus. Clinically though he has a lot of drainage from both wounds a lot of undermining and there is further blebs on the left foot towards the interspace between his first and second toe. 08/10/2016 -- the culture from the right foot showed normal skin flora and there was no Staphylococcus aureus was group A streptococcus isolated. 09/21/2016 -- -- MRI of the right foot was done on 09/13/2016 - IMPRESSION: Findings most consistent with acute osteomyelitis throughout the great toe, sesamoid bones and plantar aspect of the head of the first metatarsal. Fluid in the sheath of the flexor tendon of the great toe could be sympathetic but is worrisome for septic tenosynovitis. First MTP joint effusion worrisome for septic joint. He was admitted to the hospital on 09/11/2016 and treated for a fever with vancomycin and cefepime. He was seen by Dr. Bobby Rumpf of infectious disease who recommended 6 weeks treatment with vancomycin and ceftazidime with his hemodialysis and to continue to see as in the wound clinic. Vascular consult was pending. The patient was discharged home on 09/14/2016 and he would continue with IV antibiotics for 6 weeks. 09/28/16 wound appears reasonably healthy. Continuing with total contact cast 10/05/16 wound is smaller and looking healthy. Continue with total contact cast. He continues on IV antibiotics 10/12/2016 -- he has developed a new wound on the dorsal aspect of his left big toe and this is a superficial injury with no surrounding cellulitis. He has completed 30 days of IV antibiotics and is now  ready to start his hyperbaric oxygen therapy as per his insurance company's recommendation. 10/19/2016 -- after the cast was removed on the right side he has got good resolution of his ulceration on the right plantar foot. he has a new wound on the left plantar foot in the region of his fourth metatarsal and this will need sharp debridement. 10/26/2016 -- Xray of the right foot complete: IMPRESSION: Changes consistent with osteomyelitis involving the head of the first metatarsal and base of the first proximal phalanx. The sesamoid bones are also likely involved given their positioning. 11/02/2016 -- there still awaiting insurance clearance for his hyperbaric oxygen therapy and hopefully he will begin treatment soon. 11/09/2016 -- started with hyperbaric oxygen therapy and had some barotrauma to the right ear and this was seen by ENT who was prescribed Afrin drops and would probably continue with HBO and he is scheduled for myringotomy tubes on Friday 11/16/2016 -- he had his myringotomy tubes placed on Friday and has been doing much better after that with some fluid draining out after hyperbaric treatment today. The pain was much better. 11/30/2016 -- over the last 2 days he noticed a swelling and change of color of his right second toe and this had been draining minimal fluid. 12/07/2016 -- x-ray of the right foot -- IMPRESSION:1. Progressive ulceration at the distal aspect of the second digit with significant soft tissue swelling and osseous changes in the distal phalanx compatible with osteomyelitis. 2. Chronic osteomyelitis at the first MTP  joint. 3. Ulcerations at the second and third toes as well without definite osseous changes. Osteomyelitis is not excluded. 4. Fifth digit amputation. On 12/01/2016 I spoke to Dr. Bobby Rumpf, the infectious disease specialist, who kindly agreed to treat this with IV antibiotics and he would call in the order to the dialysis center and this has been  discussed in detail with the patient who will make the appropriate arrangements. The patient will also book an appointment as soon as possible to see Dr. [name] in the office. Of note the patient has not been on antibiotics this entire week as the dialysis center did not receive any orders from Dr. [name]. I got in touch with Dr. [name] who tells me the patient has an appointment to see him this coming Wednesday. 12/14/2016 -- the patient is on ceftazidime and vancomycin during his dialysis, and I understand this was put on by his nephrologist Dr. Raliegh Ip possibly after speaking with infectious disease Dr. [name] 12/23/16; patient was on my schedule today for a wound evaluation as he had difficulties with his schedule earlier this week. I note that he is on vancomycin and ceftazidine at dialysis. In spite of this he arrives today with a new wound on the base of the right second toe this easily probes to bone. The known wound at the tip of the second toe With this area. He also has a superficial area on the medial aspect of the third toe on the side of the DIP. This does not appear to have much depth. The area on the plantar left foot is a deep area but did not probe the bone 12/28/16 -- he has brought in some lab work and the most recent labs done showed a hemoglobin of 12.1 hematocrit of 36.3, neutrophils of 51% WBC count of 5.6, BN of 53, albumin of 4.1 globulin of 3.7, vancomycin 13 g per mL. 01/04/2017 -- he saw Dr. [name] who has recommended a amputation of the right second toe and he is awaiting this date. He sees Dr. Bobby Rumpf of infectious disease tomorrow. The bone culture taken on 12/28/2016 had no growth in 2 days. 01/11/2017 -- was seen by Dr. Bobby Rumpf regarding the management and has recommended a eval by vascular surgeons. He recommended to continue the antibiotics during his hemodialysis. Xray of the left foot -- IMPRESSION: No acute fracture, dislocation, or osseous  erosion identified. 01/18/2017 -- he has his vascular workup later today and he is going to have his right foot second toe amputation this coming Friday by Dr. [name]. We have also put in for a another 30 treatments with hyperbaric oxygen therapy 01/25/2017 -- I have reviewed Dr. Donnetta Hutching is vascular report from last week where he reviewed him and thought his vascular function was good enough to heal his amputation site and no further tests were recommended. He also had his orthopedic related to surgery which is still pending the notes and we will review these next week. 02/01/2017 -- the operative remote of Dr. 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 02/08/2017 --he was seen by Dr. [name] on 02/03/2017 -- patient is on [place]. after a thorough review he had  recommended to continue with antibiotics during hemodialysis. The patient was seen by Dr. [name], but I do not find any follow-up note on the electronic medical record. he has removed the dressing over the right foot to remove the sutures and asked him to see him back in a month's time 02/22/2017 -- patient has been febrile and has been having symptoms of the upper respiratory tract infection but has not been checked for the flu and it's been over 4 days now. He says he is feeling better today. Some drainage between his left first and second toe and this needed to be looked at 03/08/2017 -- was seen by Dr. Bobby Rumpf on 03/07/2017 after review he will stop his antibiotics and see him back in a month to see if he is feeling well. 03/15/2017 -- he has completed his course of  hyperbaric oxygen therapy and is doing fine with his health otherwise. 03/22/2017 -- his nutritionist at the dialysis center has recommended a protein supplement to help build his collagen and we will prescribe this for him when he has the details 05/03/2017 -- he recently noticed the area on the plantar aspect of his fifth metatarsal head which opened out and had minimal drainage. 05/10/2017 -- -- x-ray of the left foot -- IMPRESSION:1. No convincing conventional radiographic evidence of active osteomyelitis.2. Active soft tissue ulceration at the tip of the fifth digit, and at the lateral aspect of the foot adjacent the base of the fifth metatarsal. 3. Surgical changes of prior fourth toe amputation. 4. 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 5. Small vessel atherosclerotic vascular calcifications. 6. 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 ======= Readmission after 5 weeks: 06/15/2017 -- the patient returns after 5 weeks having had an MRI done on 05/17/2017 MRI of the left foot without contrast showed soft tissue ulcer overlying the base of the fifth metatarsal. 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 No drainable fluid collection to suggest an abscess. He was in hospital between 05/16/2017 and 05/25/2017 -- and on discharge was asked to follow-up with Dr. [name] and Dr. [name]. He was treated 2 weeks post discharge with vancomycin plus ceftriaxone with hemodialysis and oral metronidazole 500 mg 3 times a day. 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 last hemoglobin A1c was 6.7% He had a postoperative MR of the foot on 05/23/2017 -- which showed no new areas of cortical bone loss, edema or soft tissue ulceration to suggest osteomyelitis. the patient has completed his course of  IV antibiotics during dialysis and is to see the infectious disease doctor tomorrow. Dr. [name] had seen him after suture removal and asked him to keep the wound with a dry dressing 06/21/2017 -- he was seen by Dr. Bobby Rumpf of infectious disease on 06/16/2017 -- he stopped his Flagyl and will see him back in 6 weeks. He was asked to follow-up with Dr. [name] and with the wound clinic clinic. 07/05/17; bone biopsy from last time showed acute osteomyelitis. This is from the reminiscent in the left fifth metatarsal. Culture result is apparently still pending [holding for anaerobe}. From my understanding in this case this is been a progressive necrotic wound which is deteriorated markedly over the last 3 weeks since he returned here. He now has a large area of exposed bone which was biopsied and cultured  last week. Dr. Graylon Good has put him on vancomycin and Fortaz during his hemodialysis and Flagyl orally. He is to see Dr. [name] next week 07/19/2017 -- the patient was reviewed by Dr. [name] of orthopedics who reviewed the case in detail and agreed with the plan to continue with IV antibiotics, aggressive wound care and hyperbaric oxygen therapy. He would see him back in 3 weeks' time 08/09/2017 -- saw Dr. Bobby Rumpf on 08/03/2017 -he was restarted on his antibiotics for 6 weeks which included vanco, ceftaz and Flagyl. he recommended continuing his antibiotics for 6 weeks and reevaluate his completion date. He would continue with wound care and hyperbaric oxygen therapy. 08/16/2017 -- I understand he will be completing his 6 weeks of antibiotics sometime later this week. 09/19/2017 -- he has been without his wound VAC for the last week due to lack of supplies. He has been packing his wound with silver alginate. 10/04/2017 -- he has an appointment with Dr. [name] tomorrow and hence we will not apply the wound VAC after his dressing changes today. 10/11/2017 -- he was seen by Dr. Bobby Rumpf on 10/05/2017, noted that the patient was currently off antibiotics, and after thorough review he recommended he follow-up with the wound care as per plan and no further antibiotics at this point. 11/29/17; patient is arrived for the wound on his left lateral foot.Marland Kitchen He is completed IV antibiotics and 2 rounds of hyperbaric oxygen for treatment of underlying osteomyelitis. He arrives today with a surface on most of the wound area however our intake nurse noted drainage from the superior aspect. This was brought to my attention. 12/13/2017 -- the right plantar foot had a large bleb and once the bleb was opened out a large callus and subcutaneous debris was removed and he has a plantar ulcer near the fourth metatarsal head 12/28/17 on evaluation today patient appears to be doing acutely worse in regard to his left foot. The wound which has been appearing to do better as now open up more deeply there is bone palpable at the base of the wound unfortunately. He tells me that this feels "like it did when he had osteomyelitis previously" he also noted that his second toe on the left foot appears to be doing worse and is swollen there does appear to be some fluid collected underneath. His right foot plantar ulcer appears to be doing somewhat better at this point and there really is no complication at the site currently. No fevers, chills, nausea, or vomiting noted at this time. Patient states that he normally has no pain at this site however. Today he is having significant pain. 01/03/18; culture done last week showed methicillin sensitive staph aureus and group B strep. He was on Septra however he arrives today with a fever of 101. He has a functional dialysis shunt in his left arm but has had no pain here. No cough. He still makes some urine no dysuria he did have abdominal pain nausea and diarrhea over the weekend but is not had any diarrhea since yesterday. Otherwise he has no specific  complaints 01/05/18; the patient returns today in follow-up for his presentation from 1/8. At that point he was febrile. I gave him some Levaquin adjusted for his dialysis status. He tells me the fever broke that night and it is not likely that this was actually a wound infection. He is gone on to have an MRI of the left foot; this showed interval development of an abnormal signal from  the base of the third and fourth metatarsal and in the cuboid reminiscent consistent with osteomyelitis. There is no mention of the left second toe I think which was a concern when it was ordered. The patient is taking his Levaquin 01/10/18; the patient has completed his antibiotics today. The area on the left lateral foot is smaller but it still probes easily to bone. He has underlying osteomyelitis here and sees Dr. Megan Salon of infectious disease this week The area on the right third plantar metatarsal head still requires debridement not much change in dimensions He has a new wound on the medial tip of his right third toe again this probes to bone. He only noticed this 2 days ago 01/17/18; the patient saw Dr. [name] last week and he is back on Vanco and Fortaz at dialysis. 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 We are previously seeing him for a wound also on the base of the fourth med head on the right. X- ray of this area did not show osteomyelitis in relation to a new wound on the tip of his right third toe. Again this probes to bone. Finally his second toe on the left which didn't really show anything on the MRI at least no report appears to have a separated cutaneous area which I think is going to come right off the tip of his toe and leave exposed bone. Whether this is infectious or ischemic I am not clear Culture I did from the third toe last week which was his new wound was negative. Plain  x-ray on the right foot did not show osteomyelitis in the toes 01/24/18; the patient is going went to see Dr. [name]. He is going to have a amputation of the left second and right third toe although paradoxically the left second toe looks better than last week. We have treating a deep probing wound on the left lateral foot and the area over the third metatarsal head on the right foot. 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 Also a debridement including bone of the left lateral foot and a closure which is still sutured. This is a bit surprising. Also apparent debridement of the base of the right third toe wound. 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 I'm not sure exactly where we are in the frame of things here. The wound on the left lateral foot is still sutured also the amputation sites of the left second and right third toe. He is been using Xeroform to the sutured areas silver alginate to the right foot 02/14/18; he continues on Rocephin at dialysis for osteomyelitis. I still don't have a good sense of where we are in the treatment duration. The wounds on the left lateral foot had the sutures removed and this clearly still probes deeply. We debrided the area with a #5 curet. We'll use silver alginate to the wound the area over the right third met head also required debridement of callus skin and subcutaneous tissue and necrotic debris over the wound surface. This tunnels superiorly but I did not unroofed this today. The areas on the tip of his toes have  a crusted surface eschar I did not debridement this either. 02/21/18; he continues on Rocephin at dialysis for osteomyelitis. I don't have a good sense of where we are and the treatment duration. The wounds on the  left lateral foot is callused over on presentation but requires debridement. The area on the right third med head plantar aspect actually is measuring smaller 02/28/18;patient is on Vanco and Fortaz at dialysis, I'm not sure why I thought this was Rocephin above unless it is been changed. I don't have a good sense of time frame here. Using silver collagen to the area on the left lateral foot and right third med head plantar foot 03/07/18; patient is completing bank and Fortaz at dialysis soon. He is using Silver collagen to the area on the left lateral foot and the right third metatarsal head. He has a smaller superficial wound distal to the left lateral foot wound. 03/14/18-he is here in follow-up evaluation for multiple ulcerations to his bilateral feet. He presents with a new ulceration to the plantar aspect of the left foot underneath the blister, this was deroofed to reveal a partial thickness ulcer. He is voicing no complaints or concerns, tolerated dialysis yesterday. We will continue with same treatment plan and he will follow-up next week 03/21/18; the patient has a area on the lateral left foot which still has a small probing area. The overall surface area of the wound is better. He presented with a new ulceration on the left foot plantar fifth metatarsal head last week. The area on the right third metatarsal head appears smaller.using silver alginate to all wound areas. The patient is changing the dressing himself. He is using a Darco forefoot offloading are on the right and a healing sandal on the left 03/28/18; original wound left lateral foot. Still nowhere close to looking like a heeling surface secondary wound on the left lateral foot at the base of his question fifth metatarsal which was new last week Right third metatarsal head. We have been using silver alginate all wounds 04/04/18; the original wound on the left lateral foot again heavy callus surrounding thick nonviable subcutaneous  tissue all requiring debridement. The new wound from 2 weeks ago just near this at the base of the fifth metatarsal head still looks about the same Unfortunately there is been deterioration in the third medical head wound on the right which now probes to bone. I must say this was a superficial wound at one point in time and I really don't have a good frame of reference year. I'll have to go back to look through records about what we know about the right foot. 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 He is also been to see Dr. [name]. Somewhere in here as somebody is ordered a VASCULAR evaluation which was done on 03/22/18. On the right is anterior tibial artery was monophasic triphasic at the posterior tibial artery. On the left anterior tibial artery monophasic posterior tibial artery monophasic. ABI in the right was 1.43 on the left 1.21. TBIs on the right at 0.41 and on the left at 0.32. A vascular consult was recommended and I think has been arranged. 04/11/18; Mr. Hedden has not had an MRI of the right foot since 2017. Recent x-ray of the right foot done in January and February was negative. However he has had a major deterioration in the wound over the third met head. He  is completed antibiotics last week at dialysis [place]. I think he is going to see vascular in follow-up. The areas on the left lateral foot and left plantar fifth metatarsal head both look satisfactory. I debrided both of these areas although the tissue here looks good. The area on the left lateral foot once probe to bone it certainly does not do that now 04/18/18; MRI of the right foot is on Thursday. He has what looks to be serosanguineous purulent drainage coming out of the wound over the right third metatarsal head today. He completed antibiotics bank and Fortaz 2 weeks ago at dialysis. He has a vascular follow-up  with regards to his arterial insufficiency although I don't exactly see when that is booked. The areas on the left foot including the lateral left foot and the plantar left fifth metatarsal head look about the same. 04/25/18-He is here in follow-up evaluation for bilateral foot ulcers. MRI obtained was negative for osteomyelitis. Wound culture was negative. We will continue with same treatment plan he'll follow-up next week 05/02/18; the right plantar foot wound over the third metatarsal head actually looks better than when I last saw this. His MRI was negative for osteomyelitis wound culture was negative. On the left plantar foot both wounds on the plantar fifth metatarsal head and on the lateral foot both are covered in a very hard circumferential callus. 05/09/18; right plantar foot wound over the third metatarsal head stable from last week. Left plantar foot wound over the fifth metatarsal head also stable but with callus around both wound areas The area on the left lateral foot had thick callus over a surface and I had some thoughts about leaving this intact however it felt boggy. We've been using silver alginate all wounds 05/16/18; since the patient was last here he was hospitalized from 5/15 through 5/19. 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 This grew MRSA. He was placed on vancomycin.MRI of the left foot suggested "progressive" osteomyelitis and bone destruction of the cuboid and the base of the fifth metatarsal. Stable erosive changes of the base of the second metatarsal. I'm wondering if they're aware that he had surgery and debridement in the area of the underlying bone previously by Dr. [name]. In any case, He was also revascularized with an angioplasty and stenting of the left tibial peroneal trunk and angioplasty of the left posterior tibial artery. He is on vancomycin at  dialysis. He has a 2 week follow-up with infectious disease. He has vascular surgery follow-up. He was started on Plavix. 05/23/18; the patient's wound on the lateral left foot at the level of the fifth metatarsal head and the plantar wound on the plantar fifth metatarsal head both look better. The area on the right third metatarsal head still has depth and undermining. We've been using silver alginate. He is on vancomycin. He has been revascularized on the left 05/30/18; he continues on vancomycin at dialysis. Revascularized on the left. The area on the left lateral foot is just about closed. Unfortunately the area over the plantar fifth metatarsal head undermining superiorly as does the area over the right third metatarsal head. Both of these significantly deteriorated from last week. He has appointments next week with infectious disease and orthopedics I delayed putting on a cast on the left until those appointments which therefore we made we'll bring him in on Friday the 14th  with the idea of a cast on the left foot 06/06/18; he continues on vancomycin at dialysis. Revascularized on the left. He arrives today with a ballotable swelling just above the area on the left lateral foot where his previous wound was. He has no pain but he is reasonably insensate. The difficult areas on the plantar left fifth metatarsal head looks stable whereas the area on the right third plantar metatarsal head about the same as last week there is undermining here although I did not unroofed this today. He required a considerable debridement of the swelling on the left lateral foot area and I unroofed an abscess with a copious amount of brown purulent material which I obtained for culture. I don't believe during his recent hospitalization he had any further imaging although I need to review this. Previous cultures from this area done in this clinic showed MRSA, I would be surprised if this is not what this is currently  even though he is on vancomycin. 06/13/18; he continues on vancomycin at dialysis however he finishes this on Sunday and then graduates the doxycycline previously prescribed by Dr. [name]. The abscess site on the lateral foot that I unroofed last time grew moderate amounts of methicillin-resistant staph aureus that is both vancomycin and tetracycline sensitive so we should be okay from that regard. He arrives today in clinic with a connection between the abscess site and the area on the lateral foot. We've been using silver alginate to all his wound areas. The right third metatarsal head wound is measuring smaller. Using silver alginate on both wound areas 06/20/18; transitioning to doxycycline prescribed by Dr. [name]. The abscess site on the lateral foot that I unroofed 2 weeks ago grew MRSA. That area has largely closed down although the lateral part of his wound on the fifth metatarsal head still probes to bone. I suspect all of this was connected. On the right third metatarsal head smaller looking wound but surrounded by nonviable tissue that once again requires debridement using silver alginate on both areas 06/27/18; on doxycycline 100 twice a day. The abscess on the lateral foot has closed down. Although he still has the wound on the left fifth metatarsal head that extends towards the lateral part of the foot. The most lateral part of this wound probes to bone Right third metatarsal head still a deep probing wound with undermining. Nevertheless I elected not to debridement this this week If everything is copious static next week on the left I'm going to attempt a total contact cast 07/04/18; he is tolerating his doxycycline. The abscess on the left lateral foot is closed down although there is still a deep wound here that probes to bone. We will use silver collagen under the total contact cast Silver collagen to the deep wound over the right third metatarsal head is well 07/11/18; he is  tolerating doxycycline. The left lateral fifth plantar metatarsal head still probes deeply but I could not probe any bone. We've been using silver collagen and this will be the second week under the total contact cast Silver collagen to deep wound over the right third metatarsal head as well 07/18/18; he is still taking doxycycline. The left lateral fifth plantar metatarsal this week probes deeply with a large amount of exposed bone. Quite a deterioration. He required extensive debridement. Specimens of bone for pathology and CNS obtained Also considerable debridement on the right third metatarsal head 07/25/18; bone for pathology last week from the lateral left foot showed osteomyelitis. CandS  of the bone Enterobacter. And Klebsiella. He is now on ciprofloxacin and the doxycycline is stopped [Dr. [name] of infectious disease] area He has an appointment with Dr. [name] tomorrow I'm going to leave the total contact cast off for this week. May wish to try to reapply that next week or the week after depending on the wound bed looks. I'm not sure if there is an operative option here, previously is followed with Dr. [name] 08/01/18 on evaluation today patient presents for reevaluation. He has been seen by infectious disease, Dr. [name], and he has placed the patient back on Ceftaz currently to be given to him at dialysis three times a week. 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 Fortunately there does not appear to be the evidence of infection at this point in time. Overall the patient has been tolerating the dressing changes without complication. We are no longer due lives in the cast of left foot secondary to the infection obviously. 08/10/18 on evaluation today patient appears to be doing rather well all things considered in regard to his left plantar foot ulcer. He does have a significant callous on the lateral portion of his  left foot he wonders if I can help clean that away to some degree today. Fortunately he is having no evidence of infection at this time which is good news. No fevers chills noted. With that being said his right plantar foot actually does have a significant callous buildup around the wound opening this seems to not be doing as well as I would like at this point. I do believe that he would benefit from sharp debridement at this site. Subsequently I think a total contact cast would be helpful for the right foot as well. 08/17/18; the patient arrived today with a total contact cast on the right foot. This actually looks quite good. He had gone to see Dr. Megan Salon of infectious disease about the osteomyelitis on the left foot. I think he is on IV Fortaz at dialysis although I am not exactly sure of the rationale for the Fortaz at the time of this dictation. He also arrived in today with a swelling on the lateral left foot this is the site of his original wounds in fact when I first saw this man when he was under Dr. Ardeen Garland care I think this was the site of where the wound was located. It was not particularly tender however using a small scalpel I opened it to [place] some moderate amount of purulent drainage. 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 Last MRI was in May that showed progressive osteomyelitis and bone destruction of the cuboid and base of the fifth metatarsal. Last week he had an abscess over this same area this was removed. Culture was negative. We are continuing with a total contact cast to the area on the third metatarsal head on the right and making some good progress here. The patient asked me again about renal transplant. He is not on the list because of the open wounds. 08/31/18; apparently the patient had an interruption in the [place] but he is now back on  this at dialysis. Apparently there was a misunderstanding and Dr. Crissie Figures orders. Due to have the MRI of the left foot tonight. The area on the right foot continues to  have callus thick skin and subcutaneous tissue around the wound edge that requires constant debridement however the wound is smaller we are using a total contact cast in this area. Alginate all wounds 09/07/18; without much surprise the MRI of his left foot showed osteomyelitis in the reminiscent of his fourth metatarsal but also the third metatarsal. She arrives in clinic today with the right foot and a total contact cast. There was purulent drainage coming out of the wound which I have cultured. Marked deterioration here with undermining widely around the wound orifice. Using pickups and a scalpel I remove callus and subcutaneous tissue from a substantial new opening. In a similar fashion the area on the left lateral foot that was blistered last week I have opened this and remove skin and subcutaneous tissue from this area to expose a obvious new wound. I think there is extension and communication between all of this on the left foot. The patient is on Fortaz at dialysis. Culture done of the right foot. We are clearly not making progress here. We have made him an appointment with Dr. [name] of orthopedics. I think an amputation of the left leg may be discussed. I don't think there is anything that can be done with foot salvage. 09/14/18; culture from the right foot last time showed a very resistant MRSA. This is resistant to doxycycline. I'm going to try to get linezolid at least for a week. He does not have a appointment with Dr. [name] yet. 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 I'll send a message to Dr. [name] of infectious disease. I want the patient to see Dr. [name] to go over the pros and cons of an amputation which I think will be a BKA. The patient had a  question about whether this is curative or not. I told him that I thought it would be although spread of staph aureus infection is not unheard of. MRI of the right foot was done at the end of April 2019 did not show osteomyelitis. The patient last saw Dr. [name] on 9/3. I'll send Dr. [name] a message. I would really like him to weigh the pros and cons of a BKA on the left otherwise he'll probably need IV antibiotics and perhaps hyperbaric oxygen again. He has not had a good response to this in the past. His MRI earlier this month showed progressive damage in the remnants of the fourth and third metatarsal 09/21/2018; sees Dr. [name] of orthopedics next week to discuss a left BKA in response to the osteomyelitis in the left foot. Using silver alginate to all wound areas. He is completing the Zyvox I did put in for him after last culture showed MRSA 09/29/2018; patient saw Dr. [name] of orthopedics discussed the osteomyelitis in the left foot third and fourth metatarsals. He did not recommend urgent surgery but certainly stated the only surgical option would be a BKA. He is communicating with Dr. [name]. The problem here is the instability of the areas on his foot which constantly generate draining abscesses. I will communicate with Dr. [name] about this. He has an appointment on 10/23 10/05/2018; patient sees Dr. [name] on 10/23. I have sent him a secure message not ordered any additional antibiotics for now. Patient continues to have 2 open areas on the left plantar foot at the fifth metatarsal head and the lateral aspect of the foot. These have not changed all that much. The area  on the right third met head still has thick callus and surrounding subcutaneous tissue we have been using silver alginate 10/12/2018; patient sees Dr. [name] or colleague on 10/23. 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 The area on the right plantar  foot is just about closed. The superior wound on the left fifth metatarsal head is callused over but I am not sure if this is closed. The area below it is about the same. Considerable amount of callus on the lateral foot. We have been using silver alginate to the wounds 10/19/2018; the patient saw Dr. [name] on 10/22. He wishes to try and continue to save the left foot. He has been given 6 weeks of oral ciprofloxacin. We continue to put a total contact cast on the right foot. The area over the fifth metatarsal head on the left is callused over/perhaps healed but I did not remove the callus to find out. He still has the wound on the left lateral midfoot requiring debridement. We have been using silver alginate to all wound areas 10/26/2018 On the left foot our intake nurse noted some purulent drainage from the inferior wound [currently the only one that is not callused over] On the right foot even though he is in total contact cast a considerable amount of thick black callus and surface eschar. On this side we have been using silver alginate under a total contact cast. he remains on ciprofloxacin as prescribed by Dr. [name] 11/02/2018; culture from last week which is done of the probing area on the left midfoot wound showed MRSA "few". I am going to need to contact Dr. [name] which I will do today I think he is going to need IV antibiotics again. The area on the left foot which was callused on the side is clearly separating today all of this was removed a copious amounts of callus and necrotic subcutaneous tissue. The area on the right plantar foot actually remained healthy looking with a healthy granulated base. We are using silver alginate to all wound areas 11/09/2018; unfortunately neither 1 of the patient's wounds areas looks at all satisfactory. On the left he has considerable necrotic debris from the plantar wound laterally over the foot. I removed copious amounts of material including  callus skin and subcutaneous tissue. On the right foot he arrives with undermining and frankly purulent drainage. Specimen obtained for culture and debridement of the callused skin and subcutaneous tissue from around the circumference. Because of this I cannot put him back in a total contact cast but to be truthful we are really unfortunately not making a lot of progress I did put in a secure text message to Dr. [name] wondering about the MRSA on the left foot. He suggested vancomycin although this is not started. I was left wondering if he expected me to send this into dialysis. Has we have more purulence on the right side wait for that result before calling dialysis 11/16/2018; I called dialysis earlier this week to get the vancomycin started. I think he got the first dose on Tuesday. The patient tells me that he fell earlier this week twisting his left foot and ankle and he is swelling. He continues to not look well. He had blood cultures done at dialysis on Tuesday he is not been informed of the results. 12/07/2018; the patient was admitted to hospital from 11/22/2018 through 12/02/2018. He underwent a left BKA. Apparently had staph sepsis. In the  meantime being off the right foot this is closed over. He follows up with Dr. 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 Subsequent to this he actually had multiple falls in fact he tells me to following which calls the wound to be his apparently. He has been seeing Dr. [name] and his physician assistant in the interim. They actually did want him to come back for reevaluation to see if there's anything we can do for me wound care perspective the help this area to heal more appropriately. The patient states that he does not have a tremendous amount of pain which is good news. No fevers, chills, nausea, or vomiting noted at this time. We  have gotten approval from Dr. [name] office had [place] for [place] to treat the patient for his stop wound. This is due to the fact that the patient was in the 90 day postop global. 1/21; the patient does not have an open wound on the right foot although he does have some pressure areas that will need to be padded when he is transferring. The dehisced surgical wound looks clean although there is some undermining of 1.5 cm superiorly. 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 This appears healthy but there is undermining. TheraSkin #1 2/11; TheraSkin #2 2/25; TheraSkin #3. Wound is measuring smaller 3/10; TheraSkin #4 wound is measuring smaller 3/24; TheraSkin #5 wound is measuring smaller and looks healthy. 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 He has no open area on the right foot although he does have some callused area over the original third metatarsal head wound. The third metatarsal is also subluxed on the right foot. I have warned him today that he cannot consider wearing a prosthesis for at least a month but he can go for measurements. He is going to need to keep the right foot padded is much as possible in his diabetic shoe indefinitely READMISSION 06/12/2019 Mr. Bialas is a type II diabetic on dialysis. He has been in this clinic multiple times with wounds on his bilateral lower extremities. 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 He also has a history of wounds on his right foot but tells me that everything is going well here. He actually came in here with his prosthesis walking with a cane. We were all really quite gratified to see this He states over the last several weeks he has had a painful area at the tip of the left third finger. His dialysis shunt in his is  in the left upper arm and this particularly hurts during dialysis when his fingers get numb and there is a lot of pain in the tip of his left third finger. I believe he is seen vascular surgery. He had a Doppler done to evaluate for a dialysis steal syndrome. He is going for a banding procedure 1 week from today towards the left AV fistula. I think if that is unsuccessful they will be looking at creating a new shunt. He had a course of doxycycline when he which he finished about a week ago. He has been using Bactroban to the finger 6/25; the patient had his banding procedure and things seem  to be going better. He is not having pain in his hand at dialysis. The area on the tip of his finger seems about closed. He did however have a paronychia I the last time I saw him. I have been advising him to use topical Bactroban washing the finger. Culture I did last time showed a few staph epidermidis which I was willing to dismiss as superficial skin contaminant. He arrives today with the finger wound looking better however the paronychia a seems worse 7/9; 2-week follow-up. He does not have an open wound on the tip of his third left finger. I thought he had an initial ischemic wound on the tip of the finger as well as a paronychia a medially. All of this appears to be closed. 8/6-Patient returns after 3 weeks for follow-up and has a right second met head plantar callus with a significant area of maceration hiding an ulcer ABI repeated today on right - 1.26 8/13-Patient returns at 1 week, the right second met head plantar wound appears to be slightly better, it is surrounded by the callus area we are using silver alginate 8/20; the patient came back to clinic 2 weeks ago with a wound on the right second metatarsal head. He apparently told me he developed callus in this area but also went away from his custom shoes to a pair of running shoes. Using silver alginate. He of course has the left BKA and  prosthesis on the left side. There might be much we can do to offload this although the patient tells me he has a wheelchair that he is using to try and stay off the wound is much as possible 8/27; right second metatarsal head. Still small punched out area with thick callus and subcutaneous tissue around the wound. We have been using silver collagen. This is always been an issue with this man's wound especially on this foot 9/3; right second met head. Still the same punched-out area with thick callus and subcutaneous tissue around the wound removing the circumference demonstrates repetitively undermining area. I have removed all the subcutaneous tissue associated with this. We have been using silver collagen 9/15; right second metatarsal head. Same punched-out thick callus and subcutaneous tissue around the wound area. Still requiring debridement we have been using silver collagen I changed back to silver alginate X-ray last time showed no evidence of osteomyelitis. Culture showed Klebsiella and he was started and Keflex apparently just 4 days ago Electronic Signature(s) Signed: 09/11/2019 6:19:19 PM By: Linton Ham MD Entered By: Linton Ham on 09/11/2019 12:54:24 -------------------------------------------------------------------------------- Physical Exam Details Patient Name: Date of Service: [name]. 09/11/2019 10:15 AM Medical Record [id number] Patient Account Number: [id number] Date of Birth/Sex: Treating RN: [birth year] (68 y.o. M) Primary Care Provider: [name] Other Clinician: Referring Provider: Treating Provider/Extender:Taniaya Rudder, Jeronimo Greaves, Buena Irish in Treatment: 13 Constitutional Sitting or standing Blood Pressure is within target range for patient.. Pulse regular and within target range for patient.Marland Kitchen Respirations regular, non-labored and within target range.. Temperature is normal and within the target range for the patient.Marland Kitchen Appears  in no distress. Notes Wound exam; right plantar second metatarsal head. Extensive debridement again using a #5 curette. This has considerable depth but does not probe to bone. Electronic Signature(s) Signed: 09/11/2019 6:19:19 PM By: Linton Ham MD Entered By: Linton Ham on 09/11/2019 12:52:45 -------------------------------------------------------------------------------- Physician Orders Details Patient Name: Date of Service: [name] 09/11/2019 10:15 AM Medical Record [id number] Patient Account Number: [id number] Date of Birth/Sex: Treating RN:  02-01-1951 (67 y.o. 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 Dressing Change Frequency Wound #41 Right Metatarsal head second Change Dressing every other day. Skin Barriers/Peri-Wound Care Barrier cream - as needed for maceration, wetness, or redness. Wound Cleansing May shower and wash wound with soap and water. Primary Wound Dressing Wound #41 Right Metatarsal head second Calcium Alginate with Silver Secondary Dressing Wound #41 Right Metatarsal head second Kerlix/Rolled Gauze Dry Gauze Other: - lightly wrap coban to hold dressing in place. felt / pad wound Edema Control Avoid standing for long periods of time Elevate legs to the level of the heart or above for 30 minutes daily and/or when sitting, a frequency of: - throughout the day. Off-Loading Other: - felt to periwound and place one below wound. Additional Orders /  Instructions Other: - minimize walking and standing on foot to aid in wound healing and callus buildup. Electronic Signature(s) Signed: 09/11/2019 6:19:19 PM By: Linton Ham MD Signed: 12/04/2019 3:04:22 PM By: Carlene Coria RN Entered By: Carlene Coria on 09/11/2019 11:49:20 -------------------------------------------------------------------------------- Problem List Details Patient Name: Date of Service: [name]. 09/11/2019 10:15 AM Medical Record [id number] Patient Account Number: [id number] Date of Birth/Sex: Treating RN: 1951/03/11 (67 y.o. 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, 06/12/2019 No Yes other extremity Inactive Problems ICD-10 Code Description Active Date Inactive Date L03.114 Cellulitis of left upper limb 06/12/2019 06/12/2019 L98.498 Non-pressure chronic ulcer of skin of other sites with other 06/12/2019 06/12/2019 specified severity S61.209D Unspecified open wound of unspecified finger without damage 06/12/2019 06/12/2019 to nail, subsequent encounter Resolved Problems Electronic Signature(s) Signed: 09/11/2019 6:19:19 PM By: Linton Ham MD Entered By: Linton Ham on 09/11/2019 12:51:07 -------------------------------------------------------------------------------- Progress Note Details Patient Name: Date of Service: [name] 09/11/2019 10:15 AM Medical Record [id number] Patient Account Number: [id number] Date of Birth/Sex: Treating RN: 1951/03/16 (68 y.o. M) Primary Care  Provider: [name] Other Clinician: Referring Provider: Treating Provider/Extender:Aldena Worm, Jeronimo Greaves, Buena Irish in Treatment: 13 Subjective History of Present Illness (HPI) The following HPI elements were documented for the patient's wound: Location: Patient presents with a wound to bilateral feet. Quality: Patient reports experiencing essentially no pain. Severity: Mildly severe wound with no evidence of infection Duration: Patient has had the wound for greater than 2 weeks prior to presenting for treatment The patient is a pleasant 68 yrs old bm here for evaluation of ulcers on the plantar aspect of both feet. 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 He has a left arm graft for access. He has been trying to stay off his feet for weeks but does not seem to have any improvement in the wound. He has been seeing someone at the foot center and was referred to the  Wound Care center for further evaluation. 12/30/15 the patient has 3 wounds one over the right first metatarsal head and 2 on the left foot at the left fifth and lleft first metatarsal head. All of these look relatively similar. The one over the left fifth probe to bone I could not prove that any of the others did. I note his MRI in November that did not show osteomyelitis. His peripheral pulses seem robust. All of these underwent surgical debridement to remove callus nonviable skin and subcutaneous tissue 01/06/16; the patient had his sutures removed from the right fifth ray amputation. There may be a small open part of this superiorly but otherwise the incision looks good. Areas over his right first, left first and left fifth metatarsal head all underwent surgical debridement as a varying degree of callus, skin and nonviable subcutaneous tissue. The area that is most worrisome is the right fifth metatarsal head which has a wound probes precariously close to bone. There  is no purulent drainage or erythema 01/13/16; I'm not exactly sure of the status of the right fifth ray amputation site however he follows with Dr. [name] later this week. The area over his right first plantar metatarsal head, left first and fifth plantar metatarsal head are all in the same status. Thick circumferential callus, nonviable subcutaneous tissue. Culture of the left fifth did not culture last week 01/20/16; all of the patient's wounds appear and roughly the same state although his amputation site on the right lateral foot looks better. His wounds over the right first, left first and left fifth metatarsal heads all underwent difficult surgical debridement removing circumferential callus nonviable skin and subcutaneous tissue. There is no overt evidence of infection in these areas. MRI at the end of December of the left foot did not show osteomyelitis, right foot showed osteomyelitis of the right fifth digit he is now status post amputation. 01/27/16 the patient's wounds over his plantar first and fifth metatarsal heads on the left all appear better having been started on a total contact cast last week. They were debridement of circumferential callus and nonviable subcutaneous tissue as was the wound over the first metatarsal head. His surgical incision on the right also had some light surface debridement done. 03/23/2016 -- the patient was doing really well and most of his wounds had almost completely healed but now he came back today with a history of having a discharge from the area of his right foot on the plantar aspect and also between his first and second toe. He also has had some discharge from the left foot. Addendum: I spoke to the PA [name] at the dialysis center whose fax number is [phone]. We discussed the infection the patient has and she will put the patient on vancomycin and Fortaz until the final culture report is back. We will fax this as soon as  available. 03/30/2016 -- left foot x-ray IMPRESSION:No definitive osteomyelitis noted. X-ray of the right foot -- IMPRESSION: 1. Soft tissue swelling. Prior amputation right fifth digit. No acute or focal bony abnormality identified. If osteomyelitis remains a clinical concern, MRI can be obtained. 2. Peripheral vascular disease. His culture reports have grown an MSSA -- and we will fax this report to his hemodialysis center. He was called in a prescription of oral doxycycline but I have told her not to fill this in as he is already on IV antibiotics. 04/06/2016 -- a few days ago, I spoke to the hemodialysis center nurse who had stopped  the IV antibiotics and he was given a prescription for doxycycline 100 mg by mouth twice a day for a week and he is on this at the present time. 05/11/2016 -- he has recently seen his PCP this week and his hemoglobin A1c was 7. He is working on his paperwork to get his orthotic shoes. 05/25/2016 -- -- x-ray of the left foot IMPRESSION:No acute bony abnormality. No radiographic changes of acute osteomyelitis. No change since prior study. X-ray of the right foot -- IMPRESSION: Postsurgical changes are seen involving the fifth toe. No evidence of acute osteomyelitis. he developed a large blister on the medial part of his right foot and this opened out and drain fluid. 06/01/2016 -- he has his MRI to be done this afternoon. 06/15/2016 -- MRI of the left forefoot without contrast shows 2 separate regions of cutaneous and subcutaneous edema and possible ulceration and blistering along the ball of the foot. No obvious osteomyelitis identified. MRI of the right foot showed cutaneous and subcutaneous thickening plantar to the first digit sesamoid with an ulcer crater but no underlying osteomyelitis is identified. 06/22/2016 -- the right foot plantar ulcer has been draining a lot of seropurulent material for the last few days. 06/30/2016 -- spoke to the dialysis center  and I believe I spoke to Clayton a PA at the center who discussed with me and agreed to putting Legrand Como on vancomycin until his cultures arrive. On review of his culture report no WBCs were seen or no organisms were seen and the culture was reincubated for better growth. The final report is back and there were no predominant growth including Streptococcus or Staphylococcus. Clinically though he has a lot of drainage from both wounds a lot of undermining and there is further blebs on the left foot towards the interspace between his first and second toe. 08/10/2016 -- the culture from the right foot showed normal skin flora and there was no Staphylococcus aureus was group A streptococcus isolated. 09/21/2016 -- -- MRI of the right foot was done on 09/13/2016 - IMPRESSION: Findings most consistent with acute osteomyelitis throughout the great toe, sesamoid bones and plantar aspect of the head of the first metatarsal. Fluid in the sheath of the flexor tendon of the great toe could be sympathetic but is worrisome for septic tenosynovitis. First MTP joint effusion worrisome for septic joint. He was admitted to the hospital on 09/11/2016 and treated for a fever with vancomycin and cefepime. He was seen by Dr. Bobby Rumpf of infectious disease who recommended 6 weeks treatment with vancomycin and ceftazidime with his hemodialysis and to continue to see as in the wound clinic. Vascular consult was pending. The patient was discharged home on 09/14/2016 and he would continue with IV antibiotics for 6 weeks. 09/28/16 wound appears reasonably healthy. Continuing with total contact cast 10/05/16 wound is smaller and looking healthy. Continue with total contact cast. He continues on IV antibiotics 10/12/2016 -- he has developed a new wound on the dorsal aspect of his left big toe and this is a superficial injury with no surrounding cellulitis. He has completed 30 days of IV antibiotics and is now ready to  start his hyperbaric oxygen therapy as per his insurance company's recommendation. 10/19/2016 -- after the cast was removed on the right side he has got good resolution of his ulceration on the right plantar foot. he has a new wound on the left plantar foot in the region of his fourth metatarsal and this will need sharp debridement.  10/26/2016 -- Xray of the right foot complete: IMPRESSION: Changes consistent with osteomyelitis involving the head of the first metatarsal and base of the first proximal phalanx. The sesamoid bones are also likely involved given their positioning. 11/02/2016 -- there still awaiting insurance clearance for his hyperbaric oxygen therapy and hopefully he will begin treatment soon. 11/09/2016 -- started with hyperbaric oxygen therapy and had some barotrauma to the right ear and this was seen by ENT who was prescribed Afrin drops and would probably continue with HBO and he is scheduled for myringotomy tubes on Friday 11/16/2016 -- he had his myringotomy tubes placed on Friday and has been doing much better after that with some fluid draining out after hyperbaric treatment today. The pain was much better. 11/30/2016 -- over the last 2 days he noticed a swelling and change of color of his right second toe and this had been draining minimal fluid. 12/07/2016 -- x-ray of the right foot -- IMPRESSION:1. Progressive ulceration at the distal aspect of the second digit with significant soft tissue swelling and osseous changes in the distal phalanx compatible with osteomyelitis. 2. Chronic osteomyelitis at the first MTP joint. 3. Ulcerations at the second and third toes as well without definite osseous changes. Osteomyelitis is not excluded. 4. Fifth digit amputation. On 12/01/2016 oo I spoke to Dr. Bobby Rumpf, the infectious disease specialist, who kindly agreed to treat this with IV antibiotics and he would call in the order to the dialysis center and this has been  discussed in detail with the patient who will make the appropriate arrangements. The patient will also book an appointment as soon as possible to see Dr. [name] in the office. Of note the patient has not been on antibiotics this entire week as the dialysis center did not receive any orders from Dr. [name]. I got in touch with Dr. [name] who tells me the patient has an appointment to see him this coming Wednesday. 12/14/2016 -- the patient is on ceftazidime and vancomycin during his dialysis, and I understand this was put on by his nephrologist Dr. Raliegh Ip possibly after speaking with infectious disease Dr. [name] 12/23/16; patient was on my schedule today for a wound evaluation as he had difficulties with his schedule earlier this week. I note that he is on vancomycin and ceftazidine at dialysis. In spite of this he arrives today with a new wound on the base of the right second toe this easily probes to bone. The known wound at the tip of the second toe With this area. He also has a superficial area on the medial aspect of the third toe on the side of the DIP. This does not appear to have much depth. The area on the plantar left foot is a deep area but did not probe the bone 12/28/16 -- he has brought in some lab work and the most recent labs done showed a hemoglobin of 12.1 hematocrit of 36.3, neutrophils of 51% WBC count of 5.6, BN of 53, albumin of 4.1 globulin of 3.7, vancomycin 13 g per mL. 01/04/2017 -- he saw Dr. [name] who has recommended a amputation of the right second toe and he is awaiting this date. He sees Dr. Bobby Rumpf of infectious disease tomorrow. The bone culture taken on 12/28/2016 had no growth in 2 days. 01/11/2017 -- was seen by Dr. Bobby Rumpf regarding the management and has recommended a eval by vascular surgeons. He recommended to continue the antibiotics during his hemodialysis. Xray of the left foot --  IMPRESSION: No acute fracture, dislocation, or osseous  erosion identified. 01/18/2017 -- he has his vascular workup later today and he is going to have his right foot second toe amputation this coming Friday by Dr. [name]. We have also put in for a another 30 treatments with hyperbaric oxygen therapy 01/25/2017 -- I have reviewed Dr. Donnetta Hutching is vascular report from last week where he reviewed him and thought his vascular function was good enough to heal his amputation site and no further tests were recommended. He also had his orthopedic related to surgery which is still pending the notes and we will review these next week. 02/01/2017 -- the operative remote of Dr. 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 02/08/2017 --he was seen by Dr. [name] on 02/03/2017 -- patient is on [place]. after a thorough review he had recommended to continue with antibiotics during hemodialysis. The patient was seen by Dr. [name], but I do not find any follow-up note on the electronic medical record. he has removed the dressing over the right foot to remove the sutures and asked him to see him back in a month's time 02/22/2017 -- patient has been febrile and has been having symptoms of the upper respiratory tract infection but has not been checked for the flu and it's been over 4 days now. He says he is feeling better today. Some drainage between his left first and second toe and this needed to be looked at 03/08/2017 -- was seen by Dr. Bobby Rumpf on 03/07/2017 after review he will stop his antibiotics and see him back in a month to see if he is feeling well. 03/15/2017 -- he has completed his course of  hyperbaric oxygen therapy and is doing fine with his health otherwise. 03/22/2017 -- his nutritionist at the dialysis center has recommended a protein supplement to help build his collagen and we will prescribe this for him when he has the details 05/03/2017 -- he recently noticed the area on the plantar aspect of his fifth metatarsal head which opened out and had minimal drainage. 05/10/2017 -- -- x-ray of the left foot -- IMPRESSION:1. No convincing conventional radiographic evidence of active osteomyelitis.2. Active soft tissue ulceration at the tip of the fifth digit, and at the lateral aspect of the foot adjacent the base of the fifth metatarsal. 3. Surgical changes of prior fourth toe amputation. 4. 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 5. Small vessel atherosclerotic vascular calcifications. 6. 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 ======= Readmission after 5 weeks: 06/15/2017 -- the patient returns after 5 weeks having had an MRI done on 05/17/2017 MRI of the left foot without contrast showed soft tissue ulcer overlying the base of the fifth metatarsal. 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 No drainable fluid collection to suggest an abscess. He was in hospital between 05/16/2017 and 05/25/2017 -- and on discharge was asked to follow-up with Dr. [name] and Dr. [name]. He was treated 2 weeks post discharge with vancomycin plus ceftriaxone with hemodialysis and oral metronidazole 500 mg 3 times a day. The patient underwent a  left fifth ray amputation and excision on 5/23, but continued to have postoperative spikes of fever. last hemoglobin A1c was 6.7% He had a postoperative MR of the foot on 05/23/2017 -- which showed no new areas of cortical bone loss, edema or soft tissue ulceration to suggest osteomyelitis. the patient has completed his course of  IV antibiotics during dialysis and is to see the infectious disease doctor tomorrow. Dr. [name] had seen him after suture removal and asked him to keep the wound with a dry dressing 06/21/2017 -- he was seen by Dr. Bobby Rumpf of infectious disease on 06/16/2017 -- he stopped his Flagyl and will see him back in 6 weeks. He was asked to follow-up with Dr. [name] and with the wound clinic clinic. 07/05/17; bone biopsy from last time showed acute osteomyelitis. This is from the reminiscent in the left fifth metatarsal. Culture result is apparently still pending [holding for anaerobe}. From my understanding in this case this is been a progressive necrotic wound which is deteriorated markedly over the last 3 weeks since he returned here. He now has a large area of exposed bone which was biopsied and cultured last week. Dr. Graylon Good has put him on vancomycin and Fortaz during his hemodialysis and Flagyl orally. He is to see Dr. [name] next week 07/19/2017 -- the patient was reviewed by Dr. [name] of orthopedics who reviewed the case in detail and agreed with the plan to continue with IV antibiotics, aggressive wound care and hyperbaric oxygen therapy. He would see him back in 3 weeks' time 08/09/2017 -- saw Dr. Bobby Rumpf on 08/03/2017 -he was restarted on his antibiotics for 6 weeks which included vanco, ceftaz and Flagyl. he recommended continuing his antibiotics for 6 weeks and reevaluate his completion date. He would continue with wound care and hyperbaric oxygen therapy. 08/16/2017 -- I understand he will be completing his 6 weeks of antibiotics sometime later this week. 09/19/2017 -- he has been without his wound VAC for the last week due to lack of supplies. He has been packing his wound with silver alginate. 10/04/2017 -- he has an appointment with Dr. [name] tomorrow and hence we will not apply the wound VAC after his dressing changes today. 10/11/2017 -- he was seen by Dr. Bobby Rumpf on 10/05/2017, noted that the patient was currently off antibiotics, and after thorough review he recommended he follow-up with the wound care as per plan and no further antibiotics at this point. 11/29/17; patient is arrived for the wound on his left lateral foot.Marland Kitchen He is completed IV antibiotics and 2 rounds of hyperbaric oxygen for treatment of underlying osteomyelitis. He arrives today with a surface on most of the wound area however our intake nurse noted drainage from the superior aspect. This was brought to my attention. 12/13/2017 -- the right plantar foot had a large bleb and once the bleb was opened out a large callus and subcutaneous debris was removed and he has a plantar ulcer near the fourth metatarsal head 12/28/17 on evaluation today patient appears to be doing acutely worse in regard to his left foot. The wound which has been appearing to do better as now open up more deeply there is bone palpable at the base of the wound unfortunately. He tells me that this feels "like it did when he had osteomyelitis previously" he also noted that his second toe on the left foot appears to be doing worse and is swollen there does appear to be some fluid collected underneath. His  right foot plantar ulcer appears to be doing somewhat better at this point and there really is no complication at the site currently. No fevers, chills, nausea, or vomiting noted at this time. Patient states that he normally has no pain at this site however. Today he is having significant pain. 01/03/18; culture done last week showed methicillin sensitive staph aureus and group B strep. He was on Septra however he arrives today with a fever of 101. He has a functional dialysis shunt in his left arm but has had no pain here. No cough. He still makes some urine no dysuria he did have abdominal pain nausea and diarrhea over the weekend but is not had any diarrhea since yesterday. Otherwise he has no specific  complaints 01/05/18; the patient returns today in follow-up for his presentation from 1/8. At that point he was febrile. I gave him some Levaquin adjusted for his dialysis status. He tells me the fever broke that night and it is not likely that this was actually a wound infection. He is gone on to have an MRI of the left foot; this showed interval development of an abnormal signal from the base of the third and fourth metatarsal and in the cuboid reminiscent consistent with osteomyelitis. There is no mention of the left second toe I think which was a concern when it was ordered. The patient is taking his Levaquin 01/10/18; the patient has completed his antibiotics today. The area on the left lateral foot is smaller but it still probes easily to bone. He has underlying osteomyelitis here and sees Dr. Megan Salon of infectious disease this week ooThe area on the right third plantar metatarsal head still requires debridement not much change in dimensions [facility] has a new wound on the medial tip of his right third toe again this probes to bone. He only noticed this 2 days ago 01/17/18; the patient saw Dr. [name] last week and he is back on Vanco and Fortaz at dialysis. 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 We are previously seeing him for a wound also on the base of the fourth med head on the right. X- ray of this area did not show osteomyelitis in relation to a new wound on the tip of his right third toe. Again this probes to bone. Finally his second toe on the left which didn't really show anything on the MRI at least no report appears to have a separated cutaneous area which I think is going to come right off the tip of his toe and leave exposed bone. Whether this is infectious or ischemic I am not clear Culture I did from the third toe last week which was his new wound was negative.  Plain x-ray on the right foot did not show osteomyelitis in the toes 01/24/18; the patient is going went to see Dr. [name]. He is going to have a amputation of the left second and right third toe although paradoxically the left second toe looks better than last week. We have treating a deep probing wound on the left lateral foot and the area over the third metatarsal head on the right foot. 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 Also a debridement including bone of the left lateral foot and a closure which is still sutured. This is a bit surprising. Also apparent debridement of the base of  the right third toe wound. 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 I'm not sure exactly where we are in the frame of things here. The wound on the left lateral foot is still sutured also the amputation sites of the left second and right third toe. He is been using Xeroform to the sutured areas silver alginate to the right foot 02/14/18; he continues on Rocephin at dialysis for osteomyelitis. I still don't have a good sense of where we are in the treatment duration. The wounds on the left lateral foot had the sutures removed and this clearly still probes deeply. We debrided the area with a #5 curet. We'll use silver alginate to the wound oothe area over the right third met head also required debridement of callus skin and subcutaneous tissue and necrotic debris over the wound surface. This tunnels superiorly but I did not unroofed this today. ooThe areas on the tip of his toes have a crusted surface eschar I did not debridement this either. 02/21/18; he continues on Rocephin at dialysis for osteomyelitis. I don't have a good sense of where we are and the treatment duration. The  wounds on the left lateral foot is callused over on presentation but requires debridement. The area on the right third med head plantar aspect actually is measuring smaller 02/28/18;patient is on Vanco and Fortaz at dialysis, I'm not sure why I thought this was Rocephin above unless it is been changed. I don't have a good sense of time frame here. Using silver collagen to the area on the left lateral foot and right third med head plantar foot 03/07/18; patient is completing bank and Fortaz at dialysis soon. He is using Silver collagen to the area on the left lateral foot and the right third metatarsal head. He has a smaller superficial wound distal to the left lateral foot wound. 03/14/18-he is here in follow-up evaluation for multiple ulcerations to his bilateral feet. He presents with a new ulceration to the plantar aspect of the left foot underneath the blister, this was deroofed to reveal a partial thickness ulcer. He is voicing no complaints or concerns, tolerated dialysis yesterday. We will continue with same treatment plan and he will follow-up next week 03/21/18; the patient has a area on the lateral left foot which still has a small probing area. The overall surface area of the wound is better. He presented with a new ulceration on the left foot plantar fifth metatarsal head last week. The area on the right third metatarsal head appears smaller.using silver alginate to all wound areas. The patient is changing the dressing himself. He is using a Darco forefoot offloading are on the right and a healing sandal on the left 03/28/18; original wound left lateral foot. Still nowhere close to looking like a heeling surface oosecondary wound on the left lateral foot at the base of his question fifth metatarsal which was new last week ooRight third metatarsal head. ooWe have been using silver alginate all wounds 04/04/18; the original wound on the left lateral foot again heavy callus surrounding thick  nonviable subcutaneous tissue all requiring debridement. The new wound from 2 weeks ago just near this at the base of the fifth metatarsal head still looks about the same ooUnfortunately there is been deterioration in the third medical  head wound on the right which now probes to bone. I must say this was a superficial wound at one point in time and I really don't have a good frame of reference year. I'll have to go back to look through records about what we know about the right foot. 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 He is also been to see Dr. [name]. Somewhere in here as somebody is ordered a VASCULAR evaluation which was done on 03/22/18. On the right is anterior tibial artery was monophasic triphasic at the posterior tibial artery. On the left anterior tibial artery monophasic posterior tibial artery monophasic. ABI in the right was 1.43 on the left 1.21. TBIs on the right at 0.41 and on the left at 0.32. A vascular consult was recommended and I think has been arranged. 04/11/18; Mr. Burlingame has not had an MRI of the right foot since 2017. Recent x-ray of the right foot done in January and February was negative. However he has had a major deterioration in the wound over the third met head. He is completed antibiotics last week at dialysis [place]. I think he is going to see vascular in follow-up. The areas on the left lateral foot and left plantar fifth metatarsal head both look satisfactory. I debrided both of these areas although the tissue here looks good. The area on the left lateral foot once probe to bone it certainly does not do that now 04/18/18; MRI of the right foot is on Thursday. He has what looks to be serosanguineous purulent drainage coming out of the wound over the right third metatarsal head today. He completed antibiotics bank and Fortaz 2 weeks ago at dialysis. He  has a vascular follow-up with regards to his arterial insufficiency although I don't exactly see when that is booked. The areas on the left foot including the lateral left foot and the plantar left fifth metatarsal head look about the same. 04/25/18-He is here in follow-up evaluation for bilateral foot ulcers. MRI obtained was negative for osteomyelitis. Wound culture was negative. We will continue with same treatment plan he'll follow-up next week 05/02/18; the right plantar foot wound over the third metatarsal head actually looks better than when I last saw this. His MRI was negative for osteomyelitis wound culture was negative. ooOn the left plantar foot both wounds on the plantar fifth metatarsal head and on the lateral foot both are covered in a very hard circumferential callus. 05/09/18; right plantar foot wound over the third metatarsal head stable from last week. ooLeft plantar foot wound over the fifth metatarsal head also stable but with callus around both wound areas ooThe area on the left lateral foot had thick callus over a surface and I had some thoughts about leaving this intact however it felt boggy. ooWe've been using silver alginate all wounds 05/16/18; since the patient was last here he was hospitalized from 5/15 through 5/19. 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 This grew MRSA. He was placed on vancomycin.MRI of the left foot suggested "progressive" osteomyelitis and bone destruction of the cuboid and the base of the fifth metatarsal. Stable erosive changes of the base of the second metatarsal. I'm wondering if they're aware that he had surgery and debridement in the area of the underlying bone previously by  Dr. [name]. In any case, He was also revascularized with an angioplasty and stenting of the left tibial peroneal trunk and angioplasty of the left posterior tibial  artery. He is on vancomycin at dialysis. He has a 2 week follow-up with infectious disease. He has vascular surgery follow-up. He was started on Plavix. 05/23/18; the patient's wound on the lateral left foot at the level of the fifth metatarsal head and the plantar wound on the plantar fifth metatarsal head both look better. The area on the right third metatarsal head still has depth and undermining. We've been using silver alginate. He is on vancomycin. He has been revascularized on the left 05/30/18; he continues on vancomycin at dialysis. Revascularized on the left. The area on the left lateral foot is just about closed. Unfortunately the area over the plantar fifth metatarsal head undermining superiorly as does the area over the right third metatarsal head. Both of these significantly deteriorated from last week. He has appointments next week with infectious disease and orthopedics I delayed putting on a cast on the left until those appointments which therefore we made we'll bring him in on Friday the 14th with the idea of a cast on the left foot 06/06/18; he continues on vancomycin at dialysis. Revascularized on the left. He arrives today with a ballotable swelling just above the area on the left lateral foot where his previous wound was. He has no pain but he is reasonably insensate. The difficult areas on the plantar left fifth metatarsal head looks stable whereas the area on the right third plantar metatarsal head about the same as last week there is undermining here although I did not unroofed this today. He required a considerable debridement of the swelling on the left lateral foot area and I unroofed an abscess with a copious amount of brown purulent material which I obtained for culture. I don't believe during his recent hospitalization he had any further imaging although I need to review this. Previous cultures from this area done in this clinic showed MRSA, I would be surprised if this is  not what this is currently even though he is on vancomycin. 06/13/18; he continues on vancomycin at dialysis however he finishes this on Sunday and then graduates the doxycycline previously prescribed by Dr. [name]. The abscess site on the lateral foot that I unroofed last time grew moderate amounts of methicillin-resistant staph aureus that is both vancomycin and tetracycline sensitive so we should be okay from that regard. He arrives today in clinic with a connection between the abscess site and the area on the lateral foot. We've been using silver alginate to all his wound areas. The right third metatarsal head wound is measuring smaller. Using silver alginate on both wound areas 06/20/18; transitioning to doxycycline prescribed by Dr. [name]. The abscess site on the lateral foot that I unroofed 2 weeks ago grew MRSA. That area has largely closed down although the lateral part of his wound on the fifth metatarsal head still probes to bone. I suspect all of this was connected. ooOn the right third metatarsal head smaller looking wound but surrounded by nonviable tissue that once again requires debridement using silver alginate on both areas 06/27/18; on doxycycline 100 twice a day. The abscess on the lateral foot has closed down. Although he still has the wound on the left fifth metatarsal head that extends towards the lateral part of the foot. The most lateral part of this wound probes to bone ooRight third metatarsal head still a  deep probing wound with undermining. Nevertheless I elected not to debridement this this week ooIf everything is copious static next week on the left I'm going to attempt a total contact cast 07/04/18; he is tolerating his doxycycline. The abscess on the left lateral foot is closed down although there is still a deep wound here that probes to bone. We will use silver collagen under the total contact cast Silver collagen to the deep wound over the right third  metatarsal head is well 07/11/18; he is tolerating doxycycline. The left lateral fifth plantar metatarsal head still probes deeply but I could not probe any bone. We've been using silver collagen and this will be the second week under the total contact cast ooSilver collagen to deep wound over the right third metatarsal head as well 07/18/18; he is still taking doxycycline. The left lateral fifth plantar metatarsal this week probes deeply with a large amount of exposed bone. Quite a deterioration. He required extensive debridement. Specimens of bone for pathology and CNS obtained ooAlso considerable debridement on the right third metatarsal head 07/25/18; bone for pathology last week from the lateral left foot showed osteomyelitis. CandS of the bone Enterobacter. And Klebsiella. He is now on ciprofloxacin and the doxycycline is stopped [Dr. [name] of infectious disease] area He has an appointment with Dr. [name] tomorrow I'm going to leave the total contact cast off for this week. May wish to try to reapply that next week or the week after depending on the wound bed looks. I'm not sure if there is an operative option here, previously is followed with Dr. [name] 08/01/18 on evaluation today patient presents for reevaluation. He has been seen by infectious disease, Dr. [name], and he has placed the patient back on Ceftaz currently to be given to him at dialysis three times a week. 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 Fortunately there does not appear to be the evidence of infection at this point in time. Overall the patient has been tolerating the dressing changes without complication. We are no longer due lives in the cast of left foot secondary to the infection obviously. 08/10/18 on evaluation today patient appears to be doing rather well all things considered in regard to his left plantar foot ulcer. He does have a  significant callous on the lateral portion of his left foot he wonders if I can help clean that away to some degree today. Fortunately he is having no evidence of infection at this time which is good news. No fevers chills noted. With that being said his right plantar foot actually does have a significant callous buildup around the wound opening this seems to not be doing as well as I would like at this point. I do believe that he would benefit from sharp debridement at this site. Subsequently I think a total contact cast would be helpful for the right foot as well. 08/17/18; the patient arrived today with a total contact cast on the right foot. This actually looks quite good. He had gone to see Dr. Megan Salon of infectious disease about the osteomyelitis on the left foot. I think he is on IV Fortaz at dialysis although I am not exactly sure of the rationale for the Fortaz at the time of this dictation. He also arrived in today with a swelling on the lateral left foot this is the site of his original wounds in fact when I first saw this  man when he was under Dr. Ardeen Garland care I think this was the site of where the wound was located. It was not particularly tender however using a small scalpel I opened it to [place] some moderate amount of purulent drainage. 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 Last MRI was in May that showed progressive osteomyelitis and bone destruction of the cuboid and base of the fifth metatarsal. Last week he had an abscess over this same area this was removed. Culture was negative. ooWe are continuing with a total contact cast to the area on the third metatarsal head on the right and making some good progress here. The patient asked me again about renal transplant. He is not on the list because of the open wounds. 08/31/18; apparently the patient  had an interruption in the [place] but he is now back on this at dialysis. Apparently there was a misunderstanding and Dr. Crissie Figures orders. Due to have the MRI of the left foot tonight. ooThe area on the right foot continues to have callus thick skin and subcutaneous tissue around the wound edge that requires constant debridement however the wound is smaller we are using a total contact cast in this area. Alginate all wounds 09/07/18; without much surprise the MRI of his left foot showed osteomyelitis in the reminiscent of his fourth metatarsal but also the third metatarsal. She arrives in clinic today with the right foot and a total contact cast. There was purulent drainage coming out of the wound which I have cultured. Marked deterioration here with undermining widely around the wound orifice. Using pickups and a scalpel I remove callus and subcutaneous tissue from a substantial new opening. ooIn a similar fashion the area on the left lateral foot that was blistered last week I have opened this and remove skin and subcutaneous tissue from this area to expose a obvious new wound. I think there is extension and communication between all of this on the left foot. ooThe patient is on Fortaz at dialysis. Culture done of the right foot. We are clearly not making progress here. ooWe have made him an appointment with Dr. [name] of orthopedics. I think an amputation of the left leg may be discussed. I don't think there is anything that can be done with foot salvage. 09/14/18; culture from the right foot last time showed a very resistant MRSA. This is resistant to doxycycline. I'm going to try to get linezolid at least for a week. He does not have a appointment with Dr. [name] yet. 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 I'll send a message to Dr. [name] of infectious disease. I want the patient to see Dr. [name] to go over the pros and cons  of an amputation which I think will be a BKA. The patient had a question about whether this is curative or not. I told him that I thought it would be although spread of staph aureus infection is not unheard of. MRI of the right foot was done at the end of April 2019 did not show osteomyelitis. The patient last saw Dr. [name] on 9/3. I'll send Dr. [name] a message. I would really like him to weigh the pros and cons of a BKA on the left otherwise he'll probably need IV antibiotics and perhaps hyperbaric oxygen again. He has  not had a good response to this in the past. His MRI earlier this month showed progressive damage in the remnants of the fourth and third metatarsal 09/21/2018; sees Dr. [name] of orthopedics next week to discuss a left BKA in response to the osteomyelitis in the left foot. Using silver alginate to all wound areas. He is completing the Zyvox I did put in for him after last culture showed MRSA 09/29/2018; patient saw Dr. [name] of orthopedics discussed the osteomyelitis in the left foot third and fourth metatarsals. He did not recommend urgent surgery but certainly stated the only surgical option would be a BKA. He is communicating with Dr. [name]. The problem here is the instability of the areas on his foot which constantly generate draining abscesses. I will communicate with Dr. [name] about this. He has an appointment on 10/23 10/05/2018; patient sees Dr. [name] on 10/23. I have sent him a secure message not ordered any additional antibiotics for now. Patient continues to have 2 open areas on the left plantar foot at the fifth metatarsal head and the lateral aspect of the foot. These have not changed all that much. The area on the right third met head still has thick callus and surrounding subcutaneous tissue we have been using silver alginate 10/12/2018; patient sees Dr. [name] or colleague on 10/23. 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 The area on the right plantar foot is just about closed. The superior wound on the left fifth metatarsal head is callused over but I am not sure if this is closed. The area below it is about the same. Considerable amount of callus on the lateral foot. We have been using silver alginate to the wounds 10/19/2018; the patient saw Dr. [name] on 10/22. He wishes to try and continue to save the left foot. He has been given 6 weeks of oral ciprofloxacin. We continue to put a total contact cast on the right foot. The area over the fifth metatarsal head on the left is callused over/perhaps healed but I did not remove the callus to find out. He still has the wound on the left lateral midfoot requiring debridement. We have been using silver alginate to all wound areas 10/26/2018 ooOn the left foot our intake nurse noted some purulent drainage from the inferior wound [currently the only one that is not callused over] ooOn the right foot even though he is in total contact cast a considerable amount of thick black callus and surface eschar. On this side we have been using silver alginate under a total contact cast. oohe remains on ciprofloxacin as prescribed by Dr. [name] 11/02/2018; culture from last week which is done of the probing area on the left midfoot wound showed MRSA "few". I am going to need to contact Dr. [name] which I will do today I think he is going to need IV antibiotics again. The area on the left foot which was callused on the side is clearly separating today all of this was removed a copious amounts of callus and necrotic subcutaneous tissue. ooThe area on the right plantar foot actually remained healthy looking with a healthy granulated base. ooWe are using silver alginate to all wound areas 11/09/2018; unfortunately neither 1 of the patient's wounds areas looks at all satisfactory. On the left he has considerable necrotic debris from the plantar  wound laterally over the foot. I removed copious amounts of material including callus skin and subcutaneous tissue.  On the right foot he arrives with undermining and frankly purulent drainage. Specimen obtained for culture and debridement of the callused skin and subcutaneous tissue from around the circumference. Because of this I cannot put him back in a total contact cast but to be truthful we are really unfortunately not making a lot of progress I did put in a secure text message to Dr. [name] wondering about the MRSA on the left foot. He suggested vancomycin although this is not started. I was left wondering if he expected me to send this into dialysis. Has we have more purulence on the right side wait for that result before calling dialysis 11/16/2018; I called dialysis earlier this week to get the vancomycin started. I think he got the first dose on Tuesday. The patient tells me that he fell earlier this week twisting his left foot and ankle and he is swelling. He continues to not look well. He had blood cultures done at dialysis on Tuesday he is not been informed of the results. 12/07/2018; the patient was admitted to hospital from 11/22/2018 through 12/02/2018. He underwent a left BKA. Apparently had staph sepsis. In the meantime being off the right foot this is closed over. He follows up with Dr. 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 Subsequent to this he actually had multiple falls in fact he tells me to following which calls the wound to be his apparently. He has been seeing Dr. [name] and his physician assistant in the interim. They actually did want him to come back for reevaluation to see if there's anything we can do for me wound care perspective the help this area to heal more appropriately. The patient states that he does not have a tremendous amount of pain which  is good news. No fevers, chills, nausea, or vomiting noted at this time. We have gotten approval from Dr. [name] office had [facility] [place] to treat the patient for his stop wound. This is due to the fact that the patient was in the 90 day postop global. 1/21; the patient does not have an open wound on the right foot although he does have some pressure areas that will need to be padded when he is transferring. The dehisced surgical wound looks clean although there is some undermining of 1.5 cm superiorly. 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 This appears healthy but there is undermining. TheraSkin #1 2/11; TheraSkin #2 2/25; TheraSkin #3. Wound is measuring smaller 3/10; TheraSkin #4 wound is measuring smaller 3/24; TheraSkin #5 wound is measuring smaller and looks healthy. 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 He has no open area on the right foot although he does have some callused area over the original third metatarsal head wound. The third metatarsal is also subluxed on the right foot. I have warned him today that he cannot consider wearing a prosthesis for at least a month but he can go for measurements. He is going to need to keep the right foot padded is much as possible in his diabetic shoe indefinitely READMISSION 06/12/2019 Mr. Bovey is a type II diabetic on dialysis. He has been in this clinic multiple times with wounds on his bilateral lower extremities. Most recently here at the beginning of this year for 3 months with a  wound on his left BKA amputation site which we managed to get to close over. He also has a history of wounds on his right foot but tells me that everything is going well here. He actually came in here with his prosthesis walking with a cane. We were all really quite gratified to see this He states over the last several weeks he has had a  painful area at the tip of the left third finger. His dialysis shunt in his is in the left upper arm and this particularly hurts during dialysis when his fingers get numb and there is a lot of pain in the tip of his left third finger. I believe he is seen vascular surgery. He had a Doppler done to evaluate for a dialysis steal syndrome. He is going for a banding procedure 1 week from today towards the left AV fistula. I think if that is unsuccessful they will be looking at creating a new shunt. He had a course of doxycycline when he which he finished about a week ago. He has been using Bactroban to the finger 6/25; the patient had his banding procedure and things seem to be going better. He is not having pain in his hand at dialysis. The area on the tip of his finger seems about closed. He did however have a paronychia I the last time I saw him. I have been advising him to use topical Bactroban washing the finger. Culture I did last time showed a few staph epidermidis which I was willing to dismiss as superficial skin contaminant. He arrives today with the finger wound looking better however the paronychia a seems worse 7/9; 2-week follow-up. He does not have an open wound on the tip of his third left finger. I thought he had an initial ischemic wound on the tip of the finger as well as a paronychia a medially. All of this appears to be closed. 8/6-Patient returns after 3 weeks for follow-up and has a right second met head plantar callus with a significant area of maceration hiding an ulcer ABI repeated today on right - 1.26 8/13-Patient returns at 1 week, the right second met head plantar wound appears to be slightly better, it is surrounded by the callus area we are using silver alginate 8/20; the patient came back to clinic 2 weeks ago with a wound on the right second metatarsal head. He apparently told me he developed callus in this area but also went away from his custom shoes to a pair of  running shoes. Using silver alginate. 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 There might be much we can do to offload this although the patient tells me he has a wheelchair that he is using to try and stay off the wound is much as possible 8/27; right second metatarsal head. Still small punched out area with thick callus and subcutaneous tissue around the wound. We have been using silver collagen. This is always been an issue with this man's wound especially on this foot 9/3; right second met head. Still the same punched-out area with thick callus and subcutaneous tissue around the wound removing the circumference demonstrates repetitively undermining area. I have removed all the subcutaneous tissue associated with this. We have been using silver collagen 9/15; right second metatarsal head. Same punched-out thick callus and subcutaneous tissue around the wound area. Still requiring debridement we have been using silver collagen I changed back to silver alginate X-ray last time showed  no evidence of osteomyelitis. Culture showed Klebsiella and he was started and Keflex apparently just 4 days ago Objective Constitutional Sitting or standing Blood Pressure is within target range for patient.. Pulse regular and within target range for patient.Marland Kitchen Respirations regular, non-labored and within target range.. Temperature is normal and within the target range for the patient.Marland Kitchen Appears in no distress. Vitals Time Taken: 10:50 AM, Height: 73 in, Weight: 202 lbs, BMI: 26.6, Temperature: 98.0 F, Pulse: 68 bpm, Respiratory Rate: 16 breaths/min, Blood Pressure: 111/66 mmHg, Capillary Blood Glucose: 87 mg/dl. General Notes: Wound exam; right plantar second metatarsal head. Extensive debridement again using a #5 curette. This has considerable depth but does not probe to bone. Integumentary (Hair, Skin) Wound #41 status is Open. Original cause of wound was Gradually Appeared. The wound  is located on the Right Metatarsal head second. The wound measures 0.3cm length x 0.3cm width x 0.8cm depth; 0.071cm^2 area and 0.057cm^3 volume. There is Fat Layer (Subcutaneous Tissue) Exposed exposed. There is no tunneling or undermining noted. There is a medium amount of serosanguineous drainage noted. The wound margin is indistinct and nonvisible. There is medium (34-66%) pale granulation within the wound bed. There is a medium (34-66%) amount of necrotic tissue within the wound bed including Adherent Slough. General Notes: callus to periwound. 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 There was a Excisional Skin/Subcutaneous Tissue Debridement with a total area of 0.09 sq cm performed by Ricard Dillon., MD. With the following instrument(s): Curette to remove Viable and Non-Viable tissue/material. Material removed includes Callus, Subcutaneous Tissue, and Slough after achieving pain control using Lidocaine 5% topical ointment. No specimens were taken. A time out was conducted at 11:45, prior to the start of the procedure. A Minimum amount of bleeding was controlled with Pressure. The procedure was tolerated well with a pain level of 0 throughout and a pain level of 0 following the procedure. Post Debridement Measurements: 0.3cm length x 0.3cm width x 0.8cm depth; 0.057cm^3 volume. Character of Wound/Ulcer Post Debridement is improved. Severity of Tissue Post Debridement is: Fat layer exposed. Post procedure Diagnosis Wound #41: Same as Pre-Procedure Plan Follow-up Appointments: Return  Appointment in 1 week. Dressing Change Frequency: Wound #41 Right Metatarsal head second: Change Dressing every other day. Skin Barriers/Peri-Wound Care: Barrier cream - as needed for maceration, wetness, or redness. Wound Cleansing: May shower and wash wound with soap and water. Primary Wound Dressing: Wound #41 Right Metatarsal head second: Calcium Alginate with Silver Secondary Dressing: Wound #41 Right Metatarsal head second: Kerlix/Rolled Gauze Dry Gauze Other: - lightly wrap coban to hold dressing in place. felt / pad wound Edema Control: Avoid standing for long periods of time Elevate legs to the level of the heart or above for 30 minutes daily and/or when sitting, a frequency of: - throughout the day. Off-Loading: Other: - felt to periwound and place one below wound. Additional Orders / Instructions: Other: - minimize walking and standing on foot to aid in wound healing and callus buildup. 1. I have changed the dressing to silver alginate with careful attention to the gently put this into the depths of the wound. 2. X-ray did not show  osteomyelitis last time. Whether he requires an MRI is something I am going to have to look through Electronic Signature(s) Signed: 09/11/2019 6:19:19 PM By: Linton Ham MD Entered By: Linton Ham on 09/11/2019 12:55:24 -------------------------------------------------------------------------------- SuperBill Details Patient Name: Date of Service: [name] 09/11/2019 Medical Record [id number] Patient Account Number: [id number] Date of Birth/Sex: Treating RN: May 15, 1951 (68 y.o. M) Primary Care Provider: [name] Other Clinician: Referring Provider: Treating Provider/Extender:Derick Seminara, Jeronimo Greaves, Buena Irish in Treatment: 13 Diagnosis Coding ICD-10 Codes Code Description E11.621 Type 2 diabetes mellitus with foot ulcer L97.512 Non-pressure chronic ulcer of other part of right foot with fat layer  exposed I70.298 Other atherosclerosis of native arteries of extremities, other extremity Facility Procedures The patient participates with Medicare or their insurance follows the Medicare Facility Guidelines: CPT4 Code Description Modifier Quantity [id number] 11042 - DEB SUBQ TISSUE 20 SQ CM/< 1 ICD-10 Diagnosis Description L97.512 Non-pressure chronic ulcer of  other part of right foot with fat layer exposed E11.621 Type 2 diabetes mellitus with foot ulcer Physician Procedures CPT4 Code Description: [id number] - WC PHYS SUBQ TISS 20 SQ CM ICD-10 Diagnosis Description L97.512 Non-pressure chronic ulcer of other part of right foot with E11.621 Type 2 diabetes mellitus with foot ulcer Modifier: fat layer expose Quantity: 1 d Electronic Signature(s) Signed: 09/11/2019 6:19:19 PM By: Linton Ham MD Entered By: Linton Ham on 09/11/2019 12:57:31
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
## 2019-12-05 NOTE — Progress Notes (Signed)
AFTON, MIKELSON ([id number]) Visit Report for 11/08/2019 Arrival Information Details Patient Name: Date of Service: BEAUX, WEDEMEYER 11/08/2019 12:30 PM Medical Record [id number] Patient Account Number: [id number] Date of Birth/Sex: Treating RN: 07/17/1951 (68 y.o. [name], Meta.Reding Primary Care Aveya Beal: [name] Other Clinician: Referring Leoncio Hansen: Treating Aloura Matsuoka/Extender:Robson, Jeronimo Greaves, Buena Irish in Treatment: 21 Visit Information History Since Last Visit Cane Added or deleted any medications: No Patient Arrived: 12:44 Any new allergies or adverse reactions: No Arrival Time: Had a fall or experienced change in No Accompanied By: self None activities of daily living that may affect Transfer Assistance: risk of falls: Patient Identification Verified: Yes Signs or symptoms of abuse/neglect since last No Secondary Verification Process Completed: Yes visito Patient Requires Transmission-Based No Hospitalized since last visit: No Precautions: Implantable device outside of the clinic excluding No Patient Has Alerts: No cellular tissue based products placed in the center since last visit: Has Dressing in Place as Prescribed: Yes Pain Present Now: No Electronic Signature(s) Signed: 12/04/2019 3:00:22 PM By: Sandre Kitty Entered By: Sandre Kitty on 11/08/2019 12:47:48 -------------------------------------------------------------------------------- Clinic Level of Care Assessment Details Patient Name: Date of Service: SAMUAL, BEALS 11/08/2019 12:30 PM Medical Record [id number] Patient Account Number: [id number] Date of Birth/Sex: Treating RN: [birth year] (68 y.o. [name], [name] Primary Care Delaney Perona: [name] Other Clinician: Referring Kailen Name: Treating Evalyn Shultis/Extender:Robson, Jeronimo Greaves, Buena Irish in Treatment: 21 Clinic Level of Care Assessment Items TOOL 4 Quantity Score X - Use when only an EandM is performed  on FOLLOW-UP visit 1 0 ASSESSMENTS - Nursing Assessment / Reassessment X - Reassessment of Co-morbidities (includes updates in patient status) 1 10 X - Reassessment of Adherence to Treatment Plan 1 5 ASSESSMENTS - Wound and Skin Assessment / Reassessment X - Simple Wound Assessment / Reassessment - one wound 1 5 _0  - Complex Wound Assessment / Reassessment - multiple wounds 0 X - Dermatologic / Skin Assessment (not related to wound area) 1 10 ASSESSMENTS - Focused Assessment X - Circumferential Edema Measurements - multi extremities 1 5 X - Nutritional Assessment / Counseling / Intervention 1 10 _1  - Lower Extremity Assessment (monofilament, tuning fork, pulses) 0 _2  - Peripheral Arterial Disease Assessment (using hand held doppler) 0 ASSESSMENTS - Ostomy and/or Continence Assessment and Care _3  - Incontinence Assessment and Management 0 _4  - Ostomy Care Assessment and Management (repouching, etc.) 0 PROCESS - Coordination of Care X - Simple Patient / Family Education for ongoing care 1 15 _5  - Complex (extensive) Patient / Family Education for ongoing care 0 X - Staff obtains Programmer, systems, Records, Test Results / Process Orders 1 10 _6  - Staff telephones HHA, Nursing Homes / Clarify orders / etc 0 _7  - Routine Transfer to another Facility (non-emergent condition) 0 _8  - Routine Hospital Admission (non-emergent condition) 0 _9  - New Admissions / Biomedical engineer / Ordering NPWT, Apligraf, etc. 0 _10  - Emergency Hospital Admission (emergent condition) 0 X - Simple Discharge Coordination 1 10 _11  - Complex (extensive) Discharge Coordination 0 PROCESS - Special Needs _12  - Pediatric / Minor Patient Management 0 _13  - Isolation Patient Management 0 _14  - Hearing / Language / Visual special needs 0 _15  - Assessment of Community assistance (transportation, D/C planning, etc.) 0 _16  - Additional assistance / Altered mentation 0 _17  - Support Surface(s) Assessment (bed, cushion, seat, etc.)  0 INTERVENTIONS - Wound Cleansing / Measurement X - Simple Wound Cleansing - one wound 1 5 _18  - Complex Wound Cleansing - multiple wounds 0 X - Wound  Imaging (photographs - any number of wounds) 1 5 _0  - Wound Tracing (instead of photographs) 0 X - Simple Wound Measurement - one wound 1 5 _1  - Complex Wound Measurement - multiple wounds 0 INTERVENTIONS - Wound Dressings X - Small Wound Dressing one or multiple wounds 1 10 _2  - Medium Wound Dressing one or multiple wounds 0 _3  - Large Wound Dressing one or multiple wounds 0 <XLKGMWNUUVOZDGUY>_4<\/IHKVQQVZDGLOVFIE>_3  - Application of Medications - topical 0 <PIRJJOACZYSAYTKZ>_6<\/WFUXNATFTDDUKGUR>_4  - Application of Medications - injection 0 INTERVENTIONS - Miscellaneous _6  - External ear exam 0 _7  - Specimen Collection (cultures, biopsies, blood, body fluids, etc.) 0 _8  - Specimen(s) / Culture(s) sent or taken to Lab for analysis 0 _9  - Patient Transfer (multiple staff / Civil Service fast streamer / Similar devices) 0 _10  - Simple Staple / Suture removal (25 or less) 0 _11  - Complex Staple / Suture removal (26 or more) 0 _12  - Hypo / Hyperglycemic Management (close monitor of Blood Glucose) 0 _13  - Ankle / Brachial Index (ABI) - do not check if billed separately 0 X - Vital Signs 1 5 Has the patient been seen at the hospital within the last three years: Yes Total Score: 110 Level Of Care: New/Established - Level 3 Electronic Signature(s) Signed: 11/08/2019 5:17:30 PM By: Deon Pilling Entered By: Deon Pilling on 11/08/2019 13:40:40 -------------------------------------------------------------------------------- Complex / Palliative Patient Assessment Details Patient Name: Date of Service: TADARRIUS, BURCH 11/08/2019 12:30 PM Medical Record [id number] Patient Account Number: [id number] Date of Birth/Sex: Treating RN: 02-09-1951 (68 y.o. [name] Primary Care Mersadez Linden: [name] Other Clinician: Referring Damier Disano: Treating Windsor Goeken/Extender:Robson, Jeronimo Greaves, Buena Irish in Treatment: 21 Palliative  Management Criteria Complex Wound Management Criteria Patient has remarkable or complex co-morbidities requiring medications or treatments that extend wound healing times. Examples: Diabetes mellitus with chronic renal failure or end stage renal disease requiring dialysis Advanced or poorly controlled rheumatoid arthritis Diabetes mellitus and end stage chronic obstructive pulmonary disease Active cancer with current chemo- or radiation therapy Type 2 Diabetes, ESRD on Dialysis Care Approach Wound Care Plan: Complex Wound Management Electronic Signature(s) Signed: 11/15/2019 6:25:25 PM By: Linton Ham MD Signed: 12/05/2019 12:08:43 PM By: Levan Hurst RN, BSN Entered By: Levan Hurst on 11/14/2019 09:50:29 -------------------------------------------------------------------------------- Encounter Discharge Information Details Patient Name: Date of Service: LEMOYNE, NESTOR 11/08/2019 12:30 PM Medical Record [id number] Patient Account Number: [id number] Date of Birth/Sex: Treating RN: 18-Jun-1951 (68 y.o. [name] Primary Care Meeah Totino: [name] Other Clinician: Referring Vint Pola: Treating Lucio Litsey/Extender:Robson, Jeronimo Greaves, Buena Irish in Treatment: 21 Encounter Discharge Information Items Discharge Condition: Stable Ambulatory Status: Cane Discharge Destination: Home Transportation: Private Auto Accompanied By: alone Schedule Follow-up Appointment: Yes Clinical Summary of Care: Patient Declined Electronic Signature(s) Signed: 11/12/2019 5:59:11 PM By: Levan Hurst RN, BSN Entered By: Levan Hurst on 11/08/2019 13:59:14 -------------------------------------------------------------------------------- Lower Extremity Assessment Details Patient Name: Date of Service: ATHENS, LEBEAU 11/08/2019 12:30 PM Medical Record [id number] Patient Account Number: [id number] Date of Birth/Sex: Treating RN: 08-06-1951 (68 y.o. [name] Primary Care Holden Draughon: [name] Other Clinician: Referring Mykaylah Ballman: Treating Samarie Pinder/Extender:Robson, Jeronimo Greaves, Buena Irish in Treatment: 21 Edema Assessment Assessed: [Left: No] [Right: No] Edema: [Left: N] [Right: o] Calf Left: Right: Point of Measurement: cm From Medial Instep cm 36 cm Ankle Left: Right: Point of Measurement: cm From Medial Instep cm 20.6 cm Vascular Assessment Pulses: Dorsalis Pedis Palpable: [Right:Yes] Electronic Signature(s) Signed: 11/08/2019 5:16:39 PM By: Baruch Gouty RN, BSN Entered By: Baruch Gouty on 11/08/2019 13:04:28 -------------------------------------------------------------------------------- Multi Wound  Chart Details Patient Name: Date of Service: COLBERT, CURENTON 11/08/2019 12:30 PM Medical Record [id number] Patient Account Number: [id number] Date of Birth/Sex: Treating RN: [birth year] (68 y.o. 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] Comorbid History: [41:Cataracts, Anemia, Arrhythmia, Congestive Heart Failure, Hypertension, Peripheral Arterial Disease, Type II Diabetes, End Stage Renal Disease, History of Burn, Osteoarthritis, Osteomyelitis, Neuropathy] [N/A:N/A] Date Acquired: [41:07/23/2019] [N/A:N/A] Weeks of Treatment: [41:14] [N/A:N/A] Wound Status: [41:Open] [N/A:N/A] Measurements L x W x D 0.4x0.3x0.7 [N/A:N/A] (cm) Area (cm) : [41:0.094]  [N/A:N/A] Volume (cm) : [41:0.066] [N/A:N/A] % Reduction in Area: [41:94.70%] [N/A:N/A] % Reduction in Volume: 87.50% [N/A:N/A] Starting Position 1 12 (o'clock): Ending Position 1 [41:12] (o'clock): Maximum Distance 1 [41:0.7] (cm): Undermining: [41:Yes] [N/A:N/A] Classification: [41:Grade 1] [N/A:N/A] Exudate Amount: [41:Small] [N/A:N/A] Exudate Type: [41:Serosanguineous] [N/A:N/A] Exudate Color: [41:red, brown] [N/A:N/A] Wound Margin: [41:Well defined, not attached N/A] Granulation Amount: [41:Large (67-100%)] [N/A:N/A] Granulation Quality: [41:Red] [N/A:N/A] Necrotic Amount: [41:None Present (0%)] [N/A:N/A] Exposed Structures: [41:Fat Layer (Subcutaneous N/A Tissue) Exposed: Yes Fascia: No Tendon: No Muscle: No Joint: No Bone: No None] [N/A:N/A] Treatment Notes Wound #41 (Right Metatarsal head second) 1. Cleanse With Wound Cleanser 3. Primary Dressing Applied Endoform 4. Secondary Dressing Dry Gauze Roll Gauze 5. Secured With Tape 7. Footwear/Offloading device applied Felt/Foam Electronic Signature(s) Signed: 11/08/2019 5:17:30 PM By: Deon Pilling Signed: 11/08/2019 5:47:05 PM By: Linton Ham MD Entered By: Linton Ham on 11/08/2019 14:32:37 -------------------------------------------------------------------------------- Multi-Disciplinary Care Plan Details Patient Name: Date of Service: RUDDY, SWIRE 11/08/2019 12:30 PM Medical Record [id number] Patient Account Number: [id number] Date of Birth/Sex: Treating RN: [birth year] (68 y.o. [name] Primary Care Tonga Prout: [name] Other Clinician: Referring Keyvon Herter: Treating Bradie Sangiovanni/Extender:Robson, Jeronimo Greaves, Buena Irish in Treatment: 21 Active Inactive Nutrition Nursing Diagnoses: Impaired glucose control: actual or potential Goals: Patient/caregiver agrees to and verbalizes understanding of need to obtain nutritional consultation Date Initiated: 08/02/2019 Date Inactivated:  08/09/2019 Target Resolution Date: 08/31/2019 Goal Status: Met Patient/caregiver will maintain therapeutic glucose control Date Initiated: 08/02/2019 Target Resolution Date: 11/23/2019 Goal Status: Active Interventions: Provide education on elevated blood sugars and impact on wound healing Provide education on nutrition Treatment Activities: Education provided on Nutrition : 11/01/2019 Patient referred to Primary Care Physician for further nutritional evaluation : 08/02/2019 Notes: Electronic Signature(s) Signed: 11/08/2019 5:17:30 PM By: Deon Pilling Entered By: Deon Pilling on 11/08/2019 13:17:26 -------------------------------------------------------------------------------- Pain Assessment Details Patient Name: Date of Service: BEXTON, HAAK 11/08/2019 12:30 PM Medical Record [id number] Patient Account Number: [id number] Date of Birth/Sex: Treating RN: 08-21-1951 (68 y.o. [name] Primary Care Dellene Mcgroarty: [name] Other Clinician: Referring Dalal Livengood: Treating Anzleigh Slaven/Extender:Robson, Jeronimo Greaves, Buena Irish in Treatment: 21 Active Problems Location of Pain Severity and Description of Pain Patient Has Paino No Site Locations Pain Management and Medication Current Pain Management: Electronic Signature(s) Signed: 11/08/2019 5:17:30 PM By: Deon Pilling Signed: 12/04/2019 3:00:22 PM By: Sandre Kitty Entered By: Sandre Kitty on 11/08/2019 12:48:45 -------------------------------------------------------------------------------- Patient/Caregiver Education Details Patient Name: [name] 11/12/2020andnbsp12:30 Date of Service: PM Medical Record [id number] Number: Patient Account Number: [id number] Treating RN: [birth year] (68 y.o. Deon Pilling Date of Birth/Gender: M) Other Clinician: Primary Care Physician:Jones, Raynelle Dick  Primary Care Physician:Jones, Raynelle Dick Treating Linton Ham Referring Physician: Physician/Extender: Sherlynn Stalls in Treatment: 21 Education Assessment Education Provided To: Patient Education Topics Provided Nutrition: Handouts: Nutrition Methods: Explain/Verbal Responses: Reinforcements needed Electronic Signature(s) Signed: 11/08/2019 5:17:30 PM By: Deon Pilling Entered By: Deon Pilling on 11/08/2019 13:17:38 -------------------------------------------------------------------------------- Wound Assessment Details Patient Name: Date of Service: ANTAEUS, KAREL 11/08/2019 12:30 PM Medical Record [id number] Patient Account Number: [id number] Date of Birth/Sex: Treating RN: 1951-05-27 (68 y.o. [name], [name] Primary Care Dashana Guizar: [name] Other Clinician: Referring Tryston Gilliam: Treating Dartanian Knaggs/Extender:Robson, Jeronimo Greaves, Buena Irish in Treatment: 21 Wound Status Wound Number: 41 Primary Diabetic Wound/Ulcer of the Lower Extremity Etiology: Wound Location: Right Metatarsal head second Wound Open Wounding Event: Gradually Appeared Status: Date Acquired: 07/23/2019 Comorbid Cataracts, Anemia, Arrhythmia, Congestive Weeks Of Treatment: 14 History: Heart Failure, Hypertension, Peripheral Arterial Clustered Wound: No Disease, Type II Diabetes, End Stage Renal Disease, History of Burn, Osteoarthritis, Osteomyelitis, Neuropathy Photos Wound Measurements Length: (cm) 0.4 Width: (cm) 0.3 Depth: (cm) 0.7 Area: (cm) 0.094 Volume: (cm) 0.066 % Reduction in Area: 94.7% % Reduction in Volume: 87.5% Epithelialization: None Tunneling: No Undermining: Yes Starting Position (o'clock): 12 Ending Position (o'clock): 12 Maximum Distance: (cm) 0.7 Wound Description Classification: Grade 1 Wound Margin: Well defined, not attached Exudate Amount: Small Exudate Type: Serosanguineous Exudate Color: red, brown Wound Bed Granulation Amount: Large (67-100%) Granulation Quality: Red Necrotic Amount: None Present (0%) Foul Odor After Cleansing:  No Slough/Fibrino Yes Exposed Structure Fascia Exposed: No Fat Layer (Subcutaneous Tissue) Exposed: Yes Tendon Exposed: No Muscle Exposed: No Joint Exposed: No Bone Exposed: No Electronic Signature(s) Signed: 11/12/2019 5:37:24 PM By: Deon Pilling Signed: 11/13/2019 4:00:22 PM By: Mikeal Hawthorne EMT/HBOT Previous Signature: 11/08/2019 5:16:39 PM Version By: Baruch Gouty RN, BSN Previous Signature: 11/08/2019 5:17:30 PM Version By: Deon Pilling Entered By: Mikeal Hawthorne on 11/12/2019 08:49:32 -------------------------------------------------------------------------------- Vitals Details Patient Name: Date of Service: TAI, SKELLY 11/08/2019 12:30 PM Medical Record [id number] Patient Account Number: [id number] Date of Birth/Sex: Treating RN: 10-Dec-1951 (68 y.o. [name], [name] Primary Care Param Capri: [name] Other Clinician: Referring Simone Rodenbeck: Treating Bostyn Kunkler/Extender:Robson, Jeronimo Greaves, Buena Irish in Treatment: 21 Vital Signs Time Taken: 12:48 Temperature (F): 98.4 Height (in): 73 Pulse (bpm): 80 Weight (lbs): 202 Respiratory Rate (breaths/min): 18 Body Mass Index (BMI): 26.6 Blood Pressure (mmHg): 136/53 Capillary Blood Glucose (mg/dl): 119 Reference Range: 80 - 120 mg / dl Electronic Signature(s) Signed: 12/04/2019 3:00:22 PM By: Sandre Kitty Entered By: Sandre Kitty on 11/08/2019 12:48:27

## 2019-12-06 ENCOUNTER — Encounter (HOSPITAL_BASED_OUTPATIENT_CLINIC_OR_DEPARTMENT_OTHER): Payer: Medicare Other | Admitting: Internal Medicine

## 2019-12-18 ENCOUNTER — Other Ambulatory Visit: Payer: Self-pay

## 2019-12-18 ENCOUNTER — Encounter (HOSPITAL_BASED_OUTPATIENT_CLINIC_OR_DEPARTMENT_OTHER): Payer: Medicare Other | Attending: Internal Medicine | Admitting: Internal Medicine

## 2019-12-18 DIAGNOSIS — E1151 Type 2 diabetes mellitus with diabetic peripheral angiopathy without gangrene: Secondary | ICD-10-CM | POA: Diagnosis not present

## 2019-12-18 DIAGNOSIS — I70298 Other atherosclerosis of native arteries of extremities, other extremity: Secondary | ICD-10-CM | POA: Diagnosis not present

## 2019-12-18 DIAGNOSIS — L97512 Non-pressure chronic ulcer of other part of right foot with fat layer exposed: Secondary | ICD-10-CM | POA: Insufficient documentation

## 2019-12-18 DIAGNOSIS — Z89512 Acquired absence of left leg below knee: Secondary | ICD-10-CM | POA: Insufficient documentation

## 2019-12-18 DIAGNOSIS — E11621 Type 2 diabetes mellitus with foot ulcer: Secondary | ICD-10-CM | POA: Diagnosis not present

## 2019-12-18 DIAGNOSIS — L03115 Cellulitis of right lower limb: Secondary | ICD-10-CM | POA: Insufficient documentation

## 2019-12-18 DIAGNOSIS — M1990 Unspecified osteoarthritis, unspecified site: Secondary | ICD-10-CM | POA: Diagnosis not present

## 2019-12-18 DIAGNOSIS — N186 End stage renal disease: Secondary | ICD-10-CM | POA: Insufficient documentation

## 2019-12-18 DIAGNOSIS — I132 Hypertensive heart and chronic kidney disease with heart failure and with stage 5 chronic kidney disease, or end stage renal disease: Secondary | ICD-10-CM | POA: Insufficient documentation

## 2019-12-18 DIAGNOSIS — E1122 Type 2 diabetes mellitus with diabetic chronic kidney disease: Secondary | ICD-10-CM | POA: Insufficient documentation

## 2019-12-18 DIAGNOSIS — Z992 Dependence on renal dialysis: Secondary | ICD-10-CM | POA: Insufficient documentation

## 2019-12-18 DIAGNOSIS — E1165 Type 2 diabetes mellitus with hyperglycemia: Secondary | ICD-10-CM | POA: Diagnosis not present

## 2019-12-18 DIAGNOSIS — Z89421 Acquired absence of other right toe(s): Secondary | ICD-10-CM | POA: Diagnosis not present

## 2019-12-18 DIAGNOSIS — E1140 Type 2 diabetes mellitus with diabetic neuropathy, unspecified: Secondary | ICD-10-CM | POA: Insufficient documentation

## 2019-12-19 NOTE — Progress Notes (Signed)
[name], [name] ([id number]) Visit Report for 12/18/2019 Debridement Details Patient Name: Date of Service: [name], [name] 12/18/2019 1:00 PM Medical Record [id number] Patient Account Number: [id number] Date of Birth/Sex: 07-15-1951 (68 y.o. M) Treating RN: Primary Care Provider: [name] Other Clinician: Referring Provider: Treating Provider/Extender:Mylo Choi, Birdena Crandall in Treatment: 27 Debridement Performed for Wound #41 Right Metatarsal head second Assessment: Performed By: Physician Ricard Dillon., MD Debridement Type: Debridement Severity of Tissue Pre Fat layer exposed Debridement: Level of Consciousness (Pre- Awake and Alert procedure): Pre-procedure Verification/Time Out Taken: Yes - 13:55 Start Time: 13:55 Pain Control: Other : benzocaine 20% Total Area Debrided (L x W): 0.5 (cm) x 0.4 (cm) = 0.2 (cm) Tissue and other material Viable, Non-Viable, Callus, Slough, Subcutaneous, Slough debrided: Level: Skin/Subcutaneous Tissue Debridement Description: Excisional Instrument: Blade, Forceps Bleeding: Minimum Hemostasis Achieved: Pressure End Time: 14:00 Procedural Pain: 0 Post Procedural Pain: 0 Response to Treatment: Procedure was tolerated well Level of Consciousness Awake and Alert (Post-procedure): Post Debridement Measurements of Total Wound Length: (cm) 0.5 Width: (cm) 0.4 Depth: (cm) 0.9 Volume: (cm) 0.141 Character of Wound/Ulcer Post Improved Debridement: Severity of Tissue Post Debridement: Fat layer exposed Post Procedure Diagnosis Same as Pre-procedure Electronic Signature(s) Signed: 12/18/2019 6:32:44 PM By: Linton Ham MD Entered By: Linton Ham on 12/18/2019 14:19:00 -------------------------------------------------------------------------------- HPI Details Patient Name: Date of Service: [name] 12/18/2019 1:00 PM Medical Record [id number] Patient Account Number:  [id number] Date of Birth/Sex: Treating RN: 1951/09/19 (68 y.o. M) Primary Care Provider: [name] Other Clinician: Referring Provider: Treating Provider/Extender:Sharmila Wrobleski, Jeronimo Greaves, Buena Irish in Treatment: 27 History of Present Illness Location: Patient presents with a wound to bilateral feet. Quality: Patient reports experiencing essentially no pain. Severity: Mildly severe wound with no evidence of infection Duration: Patient has had the wound for greater than 2 weeks prior to presenting for treatment HPI Description: The patient is a pleasant 68 yrs old bm here for evaluation of ulcers on the plantar aspect of both feet. 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 He has a left arm graft for access. He has been trying to stay off his feet for weeks but does not seem to have any improvement in the wound. He has been seeing someone at the foot center and was referred to the Wound Care center for further evaluation. 12/30/15 the patient has 3 wounds one over the right first metatarsal head and 2 on the left foot at the left fifth and lleft first metatarsal head. All of these look relatively similar. The one over the left fifth probe to bone I could not prove that any of the others did. I note his MRI in November that did not show osteomyelitis. His peripheral pulses seem robust. All of these underwent surgical debridement to remove callus nonviable skin and subcutaneous tissue 01/06/16; the patient had his sutures removed from the right fifth ray amputation. There may be a small open part of this superiorly but otherwise the incision looks good. Areas over his right first, left first and left fifth metatarsal head all underwent surgical debridement as a varying degree of callus, skin and nonviable subcutaneous tissue. The area that is most worrisome is the right fifth metatarsal head which has a wound probes precariously close to  bone. There is no purulent drainage or erythema 01/13/16; I'm not exactly sure of the status of the right fifth ray amputation site however he follows with Dr. [name] later this week.  The area over his right first plantar metatarsal head, left first and fifth plantar metatarsal head are all in the same status. Thick circumferential callus, nonviable subcutaneous tissue. Culture of the left fifth did not culture last week 01/20/16; all of the patient's wounds appear and roughly the same state although his amputation site on the right lateral foot looks better. His wounds over the right first, left first and left fifth metatarsal heads all underwent difficult surgical debridement removing circumferential callus nonviable skin and subcutaneous tissue. There is no overt evidence of infection in these areas. MRI at the end of December of the left foot did not show osteomyelitis, right foot showed osteomyelitis of the right fifth digit he is now status post amputation. 01/27/16 the patient's wounds over his plantar first and fifth metatarsal heads on the left all appear better having been started on a total contact cast last week. They were debridement of circumferential callus and nonviable subcutaneous tissue as was the wound over the first metatarsal head. His surgical incision on the right also had some light surface debridement done. 03/23/2016 -- the patient was doing really well and most of his wounds had almost completely healed but now he came back today with a history of having a discharge from the area of his right foot on the plantar aspect and also between his first and second toe. He also has had some discharge from the left foot. Addendum: I spoke to the PA [name] at the dialysis center whose fax number is [phone]. We discussed the infection the patient has and she will put the patient on vancomycin and Fortaz until the final culture report is back. We will fax this as soon  as available. 03/30/2016 -- left foot x-ray IMPRESSION:No definitive osteomyelitis noted. X-ray of the right foot -- IMPRESSION: 1. Soft tissue swelling. Prior amputation right fifth digit. No acute or focal bony abnormality identified. If osteomyelitis remains a clinical concern, MRI can be obtained. 2. Peripheral vascular disease. His culture reports have grown an MSSA -- and we will fax this report to his hemodialysis center. He was called in a prescription of oral doxycycline but I have told her not to fill this in as he is already on IV antibiotics. 04/06/2016 -- a few days ago, I spoke to the hemodialysis center nurse who had stopped the IV antibiotics and he was given a prescription for doxycycline 100 mg by mouth twice a day for a week and he is on this at the present time. 05/11/2016 -- he has recently seen his PCP this week and his hemoglobin A1c was 7. He is working on his paperwork to get his orthotic shoes. 05/25/2016 -- -- x-ray of the left foot IMPRESSION:No acute bony abnormality. No radiographic changes of acute osteomyelitis. No change since prior study. X-ray of the right foot -- IMPRESSION: Postsurgical changes are seen involving the fifth toe. No evidence of acute osteomyelitis. he developed a large blister on the medial part of his right foot and this opened out and drain fluid. 06/01/2016 -- he has his MRI to be done this afternoon. 06/15/2016 -- MRI of the left forefoot without contrast shows 2 separate regions of cutaneous and subcutaneous edema and possible ulceration and blistering along the ball of the foot. No obvious osteomyelitis identified. MRI of the right foot showed cutaneous and subcutaneous thickening plantar to the first digit sesamoid with an ulcer crater but no underlying osteomyelitis is identified. 06/22/2016 -- the right foot plantar ulcer has  been draining a lot of seropurulent material for the last few days. 06/30/2016 -- spoke to the dialysis  center and I believe I spoke to [place] a PA at the center who discussed with me and agreed to putting Legrand Como on vancomycin until his cultures arrive. On review of his culture report no WBCs were seen or no organisms were seen and the culture was reincubated for better growth. The final report is back and there were no predominant growth including Streptococcus or Staphylococcus. Clinically though he has a lot of drainage from both wounds a lot of undermining and there is further blebs on the left foot towards the interspace between his first and second toe. 08/10/2016 -- the culture from the right foot showed normal skin flora and there was no Staphylococcus aureus was group A streptococcus isolated. 09/21/2016 -- -- MRI of the right foot was done on 09/13/2016 - IMPRESSION: Findings most consistent with acute osteomyelitis throughout the great toe, sesamoid bones and plantar aspect of the head of the first metatarsal. Fluid in the sheath of the flexor tendon of the great toe could be sympathetic but is worrisome for septic tenosynovitis. First MTP joint effusion worrisome for septic joint. He was admitted to the hospital on 09/11/2016 and treated for a fever with vancomycin and cefepime. He was seen by Dr. Bobby Rumpf of infectious disease who recommended 6 weeks treatment with vancomycin and ceftazidime with his hemodialysis and to continue to see as in the wound clinic. Vascular consult was pending. The patient was discharged home on 09/14/2016 and he would continue with IV antibiotics for 6 weeks. 09/28/16 wound appears reasonably healthy. Continuing with total contact cast 10/05/16 wound is smaller and looking healthy. Continue with total contact cast. He continues on IV antibiotics 10/12/2016 -- he has developed a new wound on the dorsal aspect of his left big toe and this is a superficial injury with no surrounding cellulitis. He has completed 30 days of IV antibiotics and is now  ready to start his hyperbaric oxygen therapy as per his insurance company's recommendation. 10/19/2016 -- after the cast was removed on the right side he has got good resolution of his ulceration on the right plantar foot. he has a new wound on the left plantar foot in the region of his fourth metatarsal and this will need sharp debridement. 10/26/2016 -- Xray of the right foot complete: IMPRESSION: Changes consistent with osteomyelitis involving the head of the first metatarsal and base of the first proximal phalanx. The sesamoid bones are also likely involved given their positioning. 11/02/2016 -- there still awaiting insurance clearance for his hyperbaric oxygen therapy and hopefully he will begin treatment soon. 11/09/2016 -- started with hyperbaric oxygen therapy and had some barotrauma to the right ear and this was seen by ENT who was prescribed Afrin drops and would probably continue with HBO and he is scheduled for myringotomy tubes on Friday 11/16/2016 -- he had his myringotomy tubes placed on Friday and has been doing much better after that with some fluid draining out after hyperbaric treatment today. The pain was much better. 11/30/2016 -- over the last 2 days he noticed a swelling and change of color of his right second toe and this had been draining minimal fluid. 12/07/2016 -- x-ray of the right foot -- IMPRESSION:1. Progressive ulceration at the distal aspect of the second digit with significant soft tissue swelling and osseous changes in the distal phalanx compatible with osteomyelitis. 2. Chronic osteomyelitis at the first MTP joint.  3. Ulcerations at the second and third toes as well without definite osseous changes. Osteomyelitis is not excluded. 4. Fifth digit amputation. On 12/01/2016 I spoke to Dr. Bobby Rumpf, the infectious disease specialist, who kindly agreed to treat this with IV antibiotics and he would call in the order to the dialysis center and this has been  discussed in detail with the patient who will make the appropriate arrangements. The patient will also book an appointment as soon as possible to see Dr. [name] in the office. Of note the patient has not been on antibiotics this entire week as the dialysis center did not receive any orders from Dr. [name]. I got in touch with Dr. [name] who tells me the patient has an appointment to see him this coming Wednesday. 12/14/2016 -- the patient is on ceftazidime and vancomycin during his dialysis, and I understand this was put on by his nephrologist Dr. Raliegh Ip possibly after speaking with infectious disease Dr. [name] 12/23/16; patient was on my schedule today for a wound evaluation as he had difficulties with his schedule earlier this week. I note that he is on vancomycin and ceftazidine at dialysis. In spite of this he arrives today with a new wound on the base of the right second toe this easily probes to bone. The known wound at the tip of the second toe With this area. He also has a superficial area on the medial aspect of the third toe on the side of the DIP. This does not appear to have much depth. The area on the plantar left foot is a deep area but did not probe the bone 12/28/16 -- he has brought in some lab work and the most recent labs done showed a hemoglobin of 12.1 hematocrit of 36.3, neutrophils of 51% WBC count of 5.6, BN of 53, albumin of 4.1 globulin of 3.7, vancomycin 13 g per mL. 01/04/2017 -- he saw Dr. [name] who has recommended a amputation of the right second toe and he is awaiting this date. He sees Dr. Bobby Rumpf of infectious disease tomorrow. The bone culture taken on 12/28/2016 had no growth in 2 days. 01/11/2017 -- was seen by Dr. Bobby Rumpf regarding the management and has recommended a eval by vascular surgeons. He recommended to continue the antibiotics during his hemodialysis. Xray of the left foot -- IMPRESSION: No acute fracture, dislocation, or osseous  erosion identified. 01/18/2017 -- he has his vascular workup later today and he is going to have his right foot second toe amputation this coming Friday by Dr. [name]. We have also put in for a another 30 treatments with hyperbaric oxygen therapy 01/25/2017 -- I have reviewed Dr. Donnetta Hutching is vascular report from last week where he reviewed him and thought his vascular function was good enough to heal his amputation site and no further tests were recommended. He also had his orthopedic related to surgery which is still pending the notes and we will review these next week. 02/01/2017 -- the operative remote of Dr. 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 02/08/2017 --he was seen by Dr. [name] on 02/03/2017 -- patient is on [place]. after a thorough review he had recommended  to continue with antibiotics during hemodialysis. The patient was seen by Dr. [name], but I do not find any follow-up note on the electronic medical record. he has removed the dressing over the right foot to remove the sutures and asked him to see him back in a month's time 02/22/2017 -- patient has been febrile and has been having symptoms of the upper respiratory tract infection but has not been checked for the flu and it's been over 4 days now. He says he is feeling better today. Some drainage between his left first and second toe and this needed to be looked at 03/08/2017 -- was seen by Dr. Bobby Rumpf on 03/07/2017 after review he will stop his antibiotics and see him back in a month to see if he is feeling well. 03/15/2017 -- he has completed his course of  hyperbaric oxygen therapy and is doing fine with his health otherwise. 03/22/2017 -- his nutritionist at the dialysis center has recommended a protein supplement to help build his collagen and we will prescribe this for him when he has the details 05/03/2017 -- he recently noticed the area on the plantar aspect of his fifth metatarsal head which opened out and had minimal drainage. 05/10/2017 -- -- x-ray of the left foot -- IMPRESSION:1. No convincing conventional radiographic evidence of active osteomyelitis.2. Active soft tissue ulceration at the tip of the fifth digit, and at the lateral aspect of the foot adjacent the base of the fifth metatarsal. 3. Surgical changes of prior fourth toe amputation. 4. 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 5. Small vessel atherosclerotic vascular calcifications. 6. 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 ======= Readmission after 5 weeks: 06/15/2017 -- the patient returns after 5 weeks having had an MRI done on 05/17/2017 MRI of the left foot without contrast showed soft tissue ulcer overlying the base of the fifth metatarsal. 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 No drainable fluid collection to suggest an abscess. He was in hospital between 05/16/2017 and 05/25/2017 -- and on discharge was asked to follow-up with Dr. [name] and Dr. [name]. He was treated 2 weeks post discharge with vancomycin plus ceftriaxone with hemodialysis and oral metronidazole 500 mg 3 times a day. 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 last hemoglobin A1c was 6.7% He had a postoperative MR of the foot on 05/23/2017 -- which showed no new areas of cortical bone loss, edema or soft tissue ulceration to suggest osteomyelitis. the patient has completed his course of  IV antibiotics during dialysis and is to see the infectious disease doctor tomorrow. Dr. [name] had seen him after suture removal and asked him to keep the wound with a dry dressing 06/21/2017 -- he was seen by Dr. Bobby Rumpf of infectious disease on 06/16/2017 -- he stopped his Flagyl and will see him back in 6 weeks. He was asked to follow-up with Dr. [name] and with the wound clinic clinic. 07/05/17; bone biopsy from last time showed acute osteomyelitis. This is from the reminiscent in the left fifth metatarsal. Culture result is apparently still pending [holding for anaerobe}. From my understanding in this case this is been a progressive necrotic wound which is deteriorated markedly over the last 3 weeks since he returned here. He now has a large area of exposed bone which was biopsied and cultured last  week. Dr. Graylon Good has put him on vancomycin and Fortaz during his hemodialysis and Flagyl orally. He is to see Dr. [name] next week 07/19/2017 -- the patient was reviewed by Dr. [name] of orthopedics who reviewed the case in detail and agreed with the plan to continue with IV antibiotics, aggressive wound care and hyperbaric oxygen therapy. He would see him back in 3 weeks' time 08/09/2017 -- saw Dr. Bobby Rumpf on 08/03/2017 -he was restarted on his antibiotics for 6 weeks which included vanco, ceftaz and Flagyl. he recommended continuing his antibiotics for 6 weeks and reevaluate his completion date. He would continue with wound care and hyperbaric oxygen therapy. 08/16/2017 -- I understand he will be completing his 6 weeks of antibiotics sometime later this week. 09/19/2017 -- he has been without his wound VAC for the last week due to lack of supplies. He has been packing his wound with silver alginate. 10/04/2017 -- he has an appointment with Dr. [name] tomorrow and hence we will not apply the wound VAC after his dressing changes today. 10/11/2017 -- he was seen by Dr. Bobby Rumpf on 10/05/2017, noted that the patient was currently off antibiotics, and after thorough review he recommended he follow-up with the wound care as per plan and no further antibiotics at this point. 11/29/17; patient is arrived for the wound on his left lateral foot.Marland Kitchen He is completed IV antibiotics and 2 rounds of hyperbaric oxygen for treatment of underlying osteomyelitis. He arrives today with a surface on most of the wound area however our intake nurse noted drainage from the superior aspect. This was brought to my attention. 12/13/2017 -- the right plantar foot had a large bleb and once the bleb was opened out a large callus and subcutaneous debris was removed and he has a plantar ulcer near the fourth metatarsal head 12/28/17 on evaluation today patient appears to be doing acutely worse in regard to his left foot. The wound which has been appearing to do better as now open up more deeply there is bone palpable at the base of the wound unfortunately. He tells me that this feels "like it did when he had osteomyelitis previously" he also noted that his second toe on the left foot appears to be doing worse and is swollen there does appear to be some fluid collected underneath. His right foot plantar ulcer appears to be doing somewhat better at this point and there really is no complication at the site currently. No fevers, chills, nausea, or vomiting noted at this time. Patient states that he normally has no pain at this site however. Today he is having significant pain. 01/03/18; culture done last week showed methicillin sensitive staph aureus and group B strep. He was on [place] however he arrives today with a fever of 101. He has a functional dialysis shunt in his left arm but has had no pain here. No cough. He still makes some urine no dysuria he did have abdominal pain nausea and diarrhea over the weekend but is not had any diarrhea since yesterday. Otherwise he has no specific  complaints 01/05/18; the patient returns today in follow-up for his presentation from 1/8. At that point he was febrile. I gave him some Levaquin adjusted for his dialysis status. He tells me the fever broke that night and it is not likely that this was actually a wound infection. He is gone on to have an MRI of the left foot; this showed interval development of an abnormal signal from the  base of the third and fourth metatarsal and in the cuboid reminiscent consistent with osteomyelitis. There is no mention of the left second toe I think which was a concern when it was ordered. The patient is taking his Levaquin 01/10/18; the patient has completed his antibiotics today. The area on the left lateral foot is smaller but it still probes easily to bone. He has underlying osteomyelitis here and sees Dr. Megan Salon of infectious disease this week The area on the right third plantar metatarsal head still requires debridement not much change in dimensions He has a new wound on the medial tip of his right third toe again this probes to bone. He only noticed this 2 days ago 01/17/18; the patient saw Dr. [name] last week and he is back on Vanco and Fortaz at dialysis. 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 We are previously seeing him for a wound also on the base of the fourth med head on the right. X- ray of this area did not show osteomyelitis in relation to a new wound on the tip of his right third toe. Again this probes to bone. Finally his second toe on the left which didn't really show anything on the MRI at least no report appears to have a separated cutaneous area which I think is going to come right off the tip of his toe and leave exposed bone. Whether this is infectious or ischemic I am not clear Culture I did from the third toe last week which was his new wound was negative. Plain  x-ray on the right foot did not show osteomyelitis in the toes 01/24/18; the patient is going went to see Dr. [name]. He is going to have a amputation of the left second and right third toe although paradoxically the left second toe looks better than last week. We have treating a deep probing wound on the left lateral foot and the area over the third metatarsal head on the right foot. 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 Also a debridement including bone of the left lateral foot and a closure which is still sutured. This is a bit surprising. Also apparent debridement of the base of the right third toe wound. 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 I'm not sure exactly where we are in the frame of things here. The wound on the left lateral foot is still sutured also the amputation sites of the left second and right third toe. He is been using Xeroform to the sutured areas silver alginate to the right foot 02/14/18; he continues on Rocephin at dialysis for osteomyelitis. I still don't have a good sense of where we are in the treatment duration. The wounds on the left lateral foot had the sutures removed and this clearly still probes deeply. We debrided the area with a #5 curet. We'll use silver alginate to the wound the area over the right third met head also required debridement of callus skin and subcutaneous tissue and necrotic debris over the wound surface. This tunnels superiorly but I did not unroofed this today. The areas on the tip of his toes have a  crusted surface eschar I did not debridement this either. 02/21/18; he continues on Rocephin at dialysis for osteomyelitis. I don't have a good sense of where we are and the treatment duration. The wounds on the  left lateral foot is callused over on presentation but requires debridement. The area on the right third med head plantar aspect actually is measuring smaller 02/28/18;patient is on Vanco and Fortaz at dialysis, I'm not sure why I thought this was Rocephin above unless it is been changed. I don't have a good sense of time frame here. Using silver collagen to the area on the left lateral foot and right third med head plantar foot 03/07/18; patient is completing bank and Fortaz at dialysis soon. He is using Silver collagen to the area on the left lateral foot and the right third metatarsal head. He has a smaller superficial wound distal to the left lateral foot wound. 03/14/18-he is here in follow-up evaluation for multiple ulcerations to his bilateral feet. He presents with a new ulceration to the plantar aspect of the left foot underneath the blister, this was deroofed to reveal a partial thickness ulcer. He is voicing no complaints or concerns, tolerated dialysis yesterday. We will continue with same treatment plan and he will follow-up next week 03/21/18; the patient has a area on the lateral left foot which still has a small probing area. The overall surface area of the wound is better. He presented with a new ulceration on the left foot plantar fifth metatarsal head last week. The area on the right third metatarsal head appears smaller.using silver alginate to all wound areas. The patient is changing the dressing himself. He is using a [place] forefoot offloading are on the right and a healing sandal on the left 03/28/18; original wound left lateral foot. Still nowhere close to looking like a heeling surface secondary wound on the left lateral foot at the base of his question fifth metatarsal which was new last week Right third metatarsal head. We have been using silver alginate all wounds 04/04/18; the original wound on the left lateral foot again heavy callus surrounding thick nonviable subcutaneous  tissue all requiring debridement. The new wound from 2 weeks ago just near this at the base of the fifth metatarsal head still looks about the same Unfortunately there is been deterioration in the third medical head wound on the right which now probes to bone. I must say this was a superficial wound at one point in time and I really don't have a good frame of reference year. I'll have to go back to look through records about what we know about the right foot. 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 He is also been to see Dr. [name]. Somewhere in here as somebody is ordered a VASCULAR evaluation which was done on 03/22/18. On the right is anterior tibial artery was monophasic triphasic at the posterior tibial artery. On the left anterior tibial artery monophasic posterior tibial artery monophasic. ABI in the right was 1.43 on the left 1.21. TBIs on the right at 0.41 and on the left at 0.32. A vascular consult was recommended and I think has been arranged. 04/11/18; Mr. [name] has not had an MRI of the right foot since 2017. Recent x-ray of the right foot done in January and February was negative. However he has had a major deterioration in the wound over the third met head. He is  completed antibiotics last week at dialysis Vanco and [place]. I think he is going to see vascular in follow-up. The areas on the left lateral foot and left plantar fifth metatarsal head both look satisfactory. I debrided both of these areas although the tissue here looks good. The area on the left lateral foot once probe to bone it certainly does not do that now 04/18/18; MRI of the right foot is on Thursday. He has what looks to be serosanguineous purulent drainage coming out of the wound over the right third metatarsal head today. He completed antibiotics bank and Fortaz 2 weeks ago at dialysis. He has a vascular follow-up  with regards to his arterial insufficiency although I don't exactly see when that is booked. The areas on the left foot including the lateral left foot and the plantar left fifth metatarsal head look about the same. 04/25/18-He is here in follow-up evaluation for bilateral foot ulcers. MRI obtained was negative for osteomyelitis. Wound culture was negative. We will continue with same treatment plan he'll follow-up next week 05/02/18; the right plantar foot wound over the third metatarsal head actually looks better than when I last saw this. His MRI was negative for osteomyelitis wound culture was negative. On the left plantar foot both wounds on the plantar fifth metatarsal head and on the lateral foot both are covered in a very hard circumferential callus. 05/09/18; right plantar foot wound over the third metatarsal head stable from last week. Left plantar foot wound over the fifth metatarsal head also stable but with callus around both wound areas The area on the left lateral foot had thick callus over a surface and I had some thoughts about leaving this intact however it felt boggy. We've been using silver alginate all wounds 05/16/18; since the patient was last here he was hospitalized from 5/15 through 5/19. 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 This grew MRSA. He was placed on vancomycin.MRI of the left foot suggested "progressive" osteomyelitis and bone destruction of the cuboid and the base of the fifth metatarsal. Stable erosive changes of the base of the second metatarsal. I'm wondering if they're aware that he had surgery and debridement in the area of the underlying bone previously by Dr. [name]. In any case, He was also revascularized with an angioplasty and stenting of the left tibial peroneal trunk and angioplasty of the left posterior tibial artery. He is on vancomycin at  dialysis. He has a 2 week follow-up with infectious disease. He has vascular surgery follow-up. He was started on Plavix. 05/23/18; the patient's wound on the lateral left foot at the level of the fifth metatarsal head and the plantar wound on the plantar fifth metatarsal head both look better. The area on the right third metatarsal head still has depth and undermining. We've been using silver alginate. He is on vancomycin. He has been revascularized on the left 05/30/18; he continues on vancomycin at dialysis. Revascularized on the left. The area on the left lateral foot is just about closed. Unfortunately the area over the plantar fifth metatarsal head undermining superiorly as does the area over the right third metatarsal head. Both of these significantly deteriorated from last week. He has appointments next week with infectious disease and orthopedics I delayed putting on a cast on the left until those appointments which therefore we made we'll bring him in on Friday the 14th with  the idea of a cast on the left foot 06/06/18; he continues on vancomycin at dialysis. Revascularized on the left. He arrives today with a ballotable swelling just above the area on the left lateral foot where his previous wound was. He has no pain but he is reasonably insensate. The difficult areas on the plantar left fifth metatarsal head looks stable whereas the area on the right third plantar metatarsal head about the same as last week there is undermining here although I did not unroofed this today. He required a considerable debridement of the swelling on the left lateral foot area and I unroofed an abscess with a copious amount of brown purulent material which I obtained for culture. I don't believe during his recent hospitalization he had any further imaging although I need to review this. Previous cultures from this area done in this clinic showed MRSA, I would be surprised if this is not what this is currently  even though he is on vancomycin. 06/13/18; he continues on vancomycin at dialysis however he finishes this on Sunday and then graduates the doxycycline previously prescribed by Dr. [name]. The abscess site on the lateral foot that I unroofed last time grew moderate amounts of methicillin-resistant staph aureus that is both vancomycin and tetracycline sensitive so we should be okay from that regard. He arrives today in clinic with a connection between the abscess site and the area on the lateral foot. We've been using silver alginate to all his wound areas. The right third metatarsal head wound is measuring smaller. Using silver alginate on both wound areas 06/20/18; transitioning to doxycycline prescribed by Dr. [name]. The abscess site on the lateral foot that I unroofed 2 weeks ago grew MRSA. That area has largely closed down although the lateral part of his wound on the fifth metatarsal head still probes to bone. I suspect all of this was connected. On the right third metatarsal head smaller looking wound but surrounded by nonviable tissue that once again requires debridement using silver alginate on both areas 06/27/18; on doxycycline 100 twice a day. The abscess on the lateral foot has closed down. Although he still has the wound on the left fifth metatarsal head that extends towards the lateral part of the foot. The most lateral part of this wound probes to bone Right third metatarsal head still a deep probing wound with undermining. Nevertheless I elected not to debridement this this week If everything is copious static next week on the left I'm going to attempt a total contact cast 07/04/18; he is tolerating his doxycycline. The abscess on the left lateral foot is closed down although there is still a deep wound here that probes to bone. We will use silver collagen under the total contact cast Silver collagen to the deep wound over the right third metatarsal head is well 07/11/18; he is  tolerating doxycycline. The left lateral fifth plantar metatarsal head still probes deeply but I could not probe any bone. We've been using silver collagen and this will be the second week under the total contact cast Silver collagen to deep wound over the right third metatarsal head as well 07/18/18; he is still taking doxycycline. The left lateral fifth plantar metatarsal this week probes deeply with a large amount of exposed bone. Quite a deterioration. He required extensive debridement. Specimens of bone for pathology and CNS obtained Also considerable debridement on the right third metatarsal head 07/25/18; bone for pathology last week from the lateral left foot showed osteomyelitis. CandS of  the bone Enterobacter. And Klebsiella. He is now on ciprofloxacin and the doxycycline is stopped [Dr. [name] of infectious disease] area He has an appointment with Dr. [name] tomorrow I'm going to leave the total contact cast off for this week. May wish to try to reapply that next week or the week after depending on the wound bed looks. I'm not sure if there is an operative option here, previously is followed with Dr. [name] 08/01/18 on evaluation today patient presents for reevaluation. He has been seen by infectious disease, Dr. [name], and he has placed the patient back on Ceftaz currently to be given to him at dialysis three times a week. 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 Fortunately there does not appear to be the evidence of infection at this point in time. Overall the patient has been tolerating the dressing changes without complication. We are no longer due lives in the cast of left foot secondary to the infection obviously. 08/10/18 on evaluation today patient appears to be doing rather well all things considered in regard to his left plantar foot ulcer. He does have a significant callous on the lateral portion of his  left foot he wonders if I can help clean that away to some degree today. Fortunately he is having no evidence of infection at this time which is good news. No fevers chills noted. With that being said his right plantar foot actually does have a significant callous buildup around the wound opening this seems to not be doing as well as I would like at this point. I do believe that he would benefit from sharp debridement at this site. Subsequently I think a total contact cast would be helpful for the right foot as well. 08/17/18; the patient arrived today with a total contact cast on the right foot. This actually looks quite good. He had gone to see Dr. Megan Salon of infectious disease about the osteomyelitis on the left foot. I think he is on IV Fortaz at dialysis although I am not exactly sure of the rationale for the Fortaz at the time of this dictation. He also arrived in today with a swelling on the lateral left foot this is the site of his original wounds in fact when I first saw this man when he was under Dr. Ardeen Garland care I think this was the site of where the wound was located. It was not particularly tender however using a small scalpel I opened it to [place] some moderate amount of purulent drainage. 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 Last MRI was in May that showed progressive osteomyelitis and bone destruction of the cuboid and base of the fifth metatarsal. Last week he had an abscess over this same area this was removed. Culture was negative. We are continuing with a total contact cast to the area on the third metatarsal head on the right and making some good progress here. The patient asked me again about renal transplant. He is not on the list because of the open wounds. 08/31/18; apparently the patient had an interruption in the [place] but he is now back on  this at dialysis. Apparently there was a misunderstanding and Dr. Crissie Figures orders. Due to have the MRI of the left foot tonight. The area on the right foot continues to have  callus thick skin and subcutaneous tissue around the wound edge that requires constant debridement however the wound is smaller we are using a total contact cast in this area. Alginate all wounds 09/07/18; without much surprise the MRI of his left foot showed osteomyelitis in the reminiscent of his fourth metatarsal but also the third metatarsal. She arrives in clinic today with the right foot and a total contact cast. There was purulent drainage coming out of the wound which I have cultured. Marked deterioration here with undermining widely around the wound orifice. Using pickups and a scalpel I remove callus and subcutaneous tissue from a substantial new opening. In a similar fashion the area on the left lateral foot that was blistered last week I have opened this and remove skin and subcutaneous tissue from this area to expose a obvious new wound. I think there is extension and communication between all of this on the left foot. The patient is on Fortaz at dialysis. Culture done of the right foot. We are clearly not making progress here. We have made him an appointment with Dr. [name] of orthopedics. I think an amputation of the left leg may be discussed. I don't think there is anything that can be done with foot salvage. 09/14/18; culture from the right foot last time showed a very resistant MRSA. This is resistant to doxycycline. I'm going to try to get linezolid at least for a week. He does not have a appointment with Dr. [name] yet. 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 I'll send a message to Dr. [name] of infectious disease. I want the patient to see Dr. [name] to go over the pros and cons of an amputation which I think will be a BKA. The patient had a  question about whether this is curative or not. I told him that I thought it would be although spread of staph aureus infection is not unheard of. MRI of the right foot was done at the end of April 2019 did not show osteomyelitis. The patient last saw Dr. [name] on 9/3. I'll send Dr. [name] a message. I would really like him to weigh the pros and cons of a BKA on the left otherwise he'll probably need IV antibiotics and perhaps hyperbaric oxygen again. He has not had a good response to this in the past. His MRI earlier this month showed progressive damage in the remnants of the fourth and third metatarsal 09/21/2018; sees Dr. [name] of orthopedics next week to discuss a left BKA in response to the osteomyelitis in the left foot. Using silver alginate to all wound areas. He is completing the Zyvox I did put in for him after last culture showed MRSA 09/29/2018; patient saw Dr. [name] of orthopedics discussed the osteomyelitis in the left foot third and fourth metatarsals. He did not recommend urgent surgery but certainly stated the only surgical option would be a BKA. He is communicating with Dr. [name]. The problem here is the instability of the areas on his foot which constantly generate draining abscesses. I will communicate with Dr. [name] about this. He has an appointment on 10/23 10/05/2018; patient sees Dr. [name] on 10/23. I have sent him a secure message not ordered any additional antibiotics for now. Patient continues to have 2 open areas on the left plantar foot at the fifth metatarsal head and the lateral aspect of the foot. These have not changed all that much. The area on  the right third met head still has thick callus and surrounding subcutaneous tissue we have been using silver alginate 10/12/2018; patient sees Dr. [name] or colleague on 10/23. 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 The area on the right plantar  foot is just about closed. The superior wound on the left fifth metatarsal head is callused over but I am not sure if this is closed. The area below it is about the same. Considerable amount of callus on the lateral foot. We have been using silver alginate to the wounds 10/19/2018; the patient saw Dr. [name] on 10/22. He wishes to try and continue to save the left foot. He has been given 6 weeks of oral ciprofloxacin. We continue to put a total contact cast on the right foot. The area over the fifth metatarsal head on the left is callused over/perhaps healed but I did not remove the callus to find out. He still has the wound on the left lateral midfoot requiring debridement. We have been using silver alginate to all wound areas 10/26/2018 On the left foot our intake nurse noted some purulent drainage from the inferior wound [currently the only one that is not callused over] On the right foot even though he is in total contact cast a considerable amount of thick black callus and surface eschar. On this side we have been using silver alginate under a total contact cast. he remains on ciprofloxacin as prescribed by Dr. [name] 11/02/2018; culture from last week which is done of the probing area on the left midfoot wound showed MRSA "few". I am going to need to contact Dr. [name] which I will do today I think he is going to need IV antibiotics again. The area on the left foot which was callused on the side is clearly separating today all of this was removed a copious amounts of callus and necrotic subcutaneous tissue. The area on the right plantar foot actually remained healthy looking with a healthy granulated base. We are using silver alginate to all wound areas 11/09/2018; unfortunately neither 1 of the patient's wounds areas looks at all satisfactory. On the left he has considerable necrotic debris from the plantar wound laterally over the foot. I removed copious amounts of material including  callus skin and subcutaneous tissue. On the right foot he arrives with undermining and frankly purulent drainage. Specimen obtained for culture and debridement of the callused skin and subcutaneous tissue from around the circumference. Because of this I cannot put him back in a total contact cast but to be truthful we are really unfortunately not making a lot of progress I did put in a secure text message to Dr. [name] wondering about the MRSA on the left foot. He suggested vancomycin although this is not started. I was left wondering if he expected me to send this into dialysis. Has we have more purulence on the right side wait for that result before calling dialysis 11/16/2018; I called dialysis earlier this week to get the vancomycin started. I think he got the first dose on Tuesday. The patient tells me that he fell earlier this week twisting his left foot and ankle and he is swelling. He continues to not look well. He had blood cultures done at dialysis on Tuesday he is not been informed of the results. 12/07/2018; the patient was admitted to hospital from 11/22/2018 through 12/02/2018. He underwent a left BKA. Apparently had staph sepsis. In the meantime  being off the right foot this is closed over. He follows up with Dr. 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 Subsequent to this he actually had multiple falls in fact he tells me to following which calls the wound to be his apparently. He has been seeing Dr. [name] and his physician assistant in the interim. They actually did want him to come back for reevaluation to see if there's anything we can do for me wound care perspective the help this area to heal more appropriately. The patient states that he does not have a tremendous amount of pain which is good news. No fevers, chills, nausea, or vomiting noted at this time. We  have gotten approval from Dr. [name] office had [place] for [place] to treat the patient for his stop wound. This is due to the fact that the patient was in the 90 day postop global. 1/21; the patient does not have an open wound on the right foot although he does have some pressure areas that will need to be padded when he is transferring. The dehisced surgical wound looks clean although there is some undermining of 1.5 cm superiorly. 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 This appears healthy but there is undermining. TheraSkin #1 2/11; TheraSkin #2 2/25; TheraSkin #3. Wound is measuring smaller 3/10; TheraSkin #4 wound is measuring smaller 3/24; TheraSkin #5 wound is measuring smaller and looks healthy. 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 He has no open area on the right foot although he does have some callused area over the original third metatarsal head wound. The third metatarsal is also subluxed on the right foot. I have warned him today that he cannot consider wearing a prosthesis for at least a month but he can go for measurements. He is going to need to keep the right foot padded is much as possible in his diabetic shoe indefinitely READMISSION 06/12/2019 Mr. Fish is a type II diabetic on dialysis. He has been in this clinic multiple times with wounds on his bilateral lower extremities. 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 He also has a history of wounds on his right foot but tells me that everything is going well here. He actually came in here with his prosthesis walking with a cane. We were all really quite gratified to see this He states over the last several weeks he has had a painful area at the tip of the left third finger. His dialysis shunt in his is  in the left upper arm and this particularly hurts during dialysis when his fingers get numb and there is a lot of pain in the tip of his left third finger. I believe he is seen vascular surgery. He had a Doppler done to evaluate for a dialysis steal syndrome. He is going for a banding procedure 1 week from today towards the left AV fistula. I think if that is unsuccessful they will be looking at creating a new shunt. He had a course of doxycycline when he which he finished about a week ago. He has been using Bactroban to the finger 6/25; the patient had his banding procedure and things seem to  be going better. He is not having pain in his hand at dialysis. The area on the tip of his finger seems about closed. He did however have a paronychia I the last time I saw him. I have been advising him to use topical Bactroban washing the finger. Culture I did last time showed a few staph epidermidis which I was willing to dismiss as superficial skin contaminant. He arrives today with the finger wound looking better however the paronychia a seems worse 7/9; 2-week follow-up. He does not have an open wound on the tip of his third left finger. I thought he had an initial ischemic wound on the tip of the finger as well as a paronychia a medially. All of this appears to be closed. 8/6-Patient returns after 3 weeks for follow-up and has a right second met head plantar callus with a significant area of maceration hiding an ulcer ABI repeated today on right - 1.26 8/13-Patient returns at 1 week, the right second met head plantar wound appears to be slightly better, it is surrounded by the callus area we are using silver alginate 8/20; the patient came back to clinic 2 weeks ago with a wound on the right second metatarsal head. He apparently told me he developed callus in this area but also went away from his custom shoes to a pair of running shoes. Using silver alginate. He of course has the left BKA and  prosthesis on the left side. There might be much we can do to offload this although the patient tells me he has a wheelchair that he is using to try and stay off the wound is much as possible 8/27; right second metatarsal head. Still small punched out area with thick callus and subcutaneous tissue around the wound. We have been using silver collagen. This is always been an issue with this man's wound especially on this foot 9/3; right second met head. Still the same punched-out area with thick callus and subcutaneous tissue around the wound removing the circumference demonstrates repetitively undermining area. I have removed all the subcutaneous tissue associated with this. We have been using silver collagen 9/15; right second metatarsal head. Same punched-out thick callus and subcutaneous tissue around the wound area. Still requiring debridement we have been using silver collagen I changed back to silver alginate X-ray last time showed no evidence of osteomyelitis. Culture showed Klebsiella and he was started and Keflex apparently just 4 days ago 9/24; right second metatarsal head. He is completed the antibiotics I gave him for 10 days. Same punched-out thick callus and subcutaneous tissue around the area. Still requiring debridement. We have been using silver alginate. The area itself looks swollen and this could be just [place] that comes on this area with walking on a prosthesis on the left however I think he needs an MRI and I am going to order that today. There is not an option to offload this further 10/1; right second plantar metatarsal head. MRI booked for October 6. Generally looking better today using silver alginate 10/8; right second plantar metatarsal head. MRI that was done on 10/6 was negative for osteomyelitis noted prior amputations of the second and fourth toes at the MTP. Prior amputation of the third toe to the base of the middle phalanx. Prior amputation of the fifth toe  to the head of the metatarsal. 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 He does not recall about hearing about this. He did not  have any pain but then again he has reduced sensation from diabetic neuropathy 10/15; right second plantar metatarsal head. Still in the same condition callus nonviable subcutaneous tissue which cleans up nicely with debridement but reforms by the next week. The patient tells me that he is offloading this is much as he can including taking his wheelchair to dialysis. We have been using silver alginate, changed to silver collagen today 10/22; right second plantar metatarsal head. Wound measures smaller but still thick callus around this wound. Still requiring debridement 11/5 right second plantar metatarsal head. Less callus around the wound circumference but still requiring debridement. There is still some undermining which I cleaned out the wound looks healthy but still considerable punched out depth with a relatively small circumference to the wound there is no palpable bone no purulent drainage 11/12; right second plantar metatarsal head. Small wound with thick callus tissue around the wound and significant undermining. We have been using silver collagen after my continuous debridements of this area 11/19; patient arrives in today with purulent drainage coming out of the wound in the second third met head area on the right foot. Serosanguineous thick drainage. Specimen obtained for culture. 11/21/2019 on evaluation today patient appears to be doing well with regard to his foot ulcer. The good news is is culture came back negative for any bacteria there was no growth. The other good news is his x-ray also appears to be doing well. The foot is also measuring much better than what it was. Overall I am very pleased with how things seem to be progressing. 12/22; patient was in [facility] for several weeks due to the  death of a family member. He said he stayed off the wound using silver alginate. Electronic Signature(s) Signed: 12/18/2019 6:32:44 PM By: Linton Ham MD Entered By: Linton Ham on 12/18/2019 14:22:37 -------------------------------------------------------------------------------- Physical Exam Details Patient Name: Date of Service: [name], [name] 12/18/2019 1:00 PM Medical Record [id number] Patient Account Number: [id number] Date of Birth/Sex: Treating RN: Nov 05, 1951 (68 y.o. M) Primary Care Provider: [name] Other Clinician: Referring Provider: Treating Provider/Extender:Shaneika Rossa, Jeronimo Greaves, Buena Irish in Treatment: 27 Constitutional Patient is hypotensive. However he appears well. Pulse regular and within target range for patient.Marland Kitchen Respirations regular, non-labored and within target range.. Temperature is normal and within the target range for the patient.Marland Kitchen Appears in no distress. Notes Wound exam; large area of thick nonviable skin over the wound area with a small opening in the middle. There was 360 degree undermining. Using a pickup and a #15 scalpel this was removed. This allows full access to the wound which is probably about the same as last time I saw this. There is no evidence of infection. Electronic Signature(s) Signed: 12/18/2019 6:32:44 PM By: Linton Ham MD Entered By: Linton Ham on 12/18/2019 14:23:43 -------------------------------------------------------------------------------- Physician Orders Details Patient Name: Date of Service: ZOHAIB, HEENEY 12/18/2019 1:00 PM Medical Record [id number] Patient Account Number: [id number] Date of Birth/Sex: Treating RN: 10/08/1951 (68 y.o. 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 Dressing Change Frequency Wound #41 Right Metatarsal head second Change Dressing every  other day. Skin Barriers/Peri-Wound Care Barrier cream - as needed for maceration, wetness, or redness. Wound Cleansing May shower and wash wound with soap and water. Primary Wound Dressing Wound #41 Right Metatarsal head second Silver Collagen - moisten with normal saline or KY jelly Secondary Dressing Wound #41 Right Metatarsal head second Kerlix/Rolled Gauze Dry Gauze Foam Border - patient may use bordered foam to cut into a felt to offload. Other: - felt to periwound and place one below wound. lightly wrap coban to hold dressing in place. felt / pad wound Edema Control Avoid standing for long periods of time Elevate legs to the level of the heart or above for 30 minutes daily and/or when sitting, a frequency of: - throughout the day. Off-Loading Other: - felt to periwound and place one below wound. Additional Orders / Instructions Other: - minimize walking and standing on foot to aid in wound healing and callus buildup. Electronic Signature(s) Signed: 12/18/2019 6:32:44 PM By: Linton Ham MD Signed: 12/19/2019 11:01:46 AM By: Carlene Coria RN Entered By: Carlene Coria on 12/18/2019 14:17:11 -------------------------------------------------------------------------------- Problem List Details Patient Name: Date of Service: ASSER, LUCENA 12/18/2019 1:00 PM Medical Record [id number] Patient Account Number: [id number] Date of Birth/Sex: Treating RN: 1951/09/17 (68 y.o. 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, 06/12/2019 No Yes other extremity L03.115 Cellulitis of right lower limb 11/15/2019 No Yes Inactive Problems ICD-10 Code Description Active Date Inactive Date L03.114 Cellulitis of left upper limb 06/12/2019 06/12/2019 L98.498 Non-pressure chronic ulcer of skin of other sites with other 06/12/2019 06/12/2019 specified severity S61.209D Unspecified open wound of unspecified finger without damage 06/12/2019 06/12/2019 to nail, subsequent encounter Resolved Problems Electronic Signature(s) Signed: 12/18/2019 6:32:44 PM By: Linton Ham MD Entered By: Linton Ham on 12/18/2019 14:18:34 -------------------------------------------------------------------------------- Progress Note Details Patient Name: Date of Service: [name] 12/18/2019 1:00 PM Medical Record [id number] Patient Account Number: [id number] Date of Birth/Sex: Treating RN: 28-Sep-1951 (68 y.o. M) Primary Care Provider: [name] Other Clinician: Referring Provider: Treating Provider/Extender:Amado Andal, Jeronimo Greaves, Buena Irish in Treatment: 27 Subjective History of Present Illness (HPI) The following HPI elements were documented for the patient's wound: Location: Patient presents with a wound to bilateral feet. Quality: Patient reports experiencing essentially no pain. Severity: Mildly severe wound with no evidence of infection Duration: Patient has had the wound for greater than 2 weeks prior to presenting for treatment The patient is a pleasant 68 yrs old bm here for evaluation of ulcers on the  plantar aspect of both feet. 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 He has a left arm graft for access. He has been trying to stay off his feet for weeks but does not seem to have any improvement in the wound. He has been seeing someone at the foot center and was referred to the Wound Care center for further evaluation. 12/30/15 the patient has 3 wounds one over the right first metatarsal head and 2 on the left foot at the left fifth and lleft first metatarsal head. All of these look relatively similar. The one over the left fifth probe to bone I  could not prove that any of the others did. I note his MRI in November that did not show osteomyelitis. His peripheral pulses seem robust. All of these underwent surgical debridement to remove callus nonviable skin and subcutaneous tissue 01/06/16; the patient had his sutures removed from the right fifth ray amputation. There may be a small open part of this superiorly but otherwise the incision looks good. Areas over his right first, left first and left fifth metatarsal head all underwent surgical debridement as a varying degree of callus, skin and nonviable subcutaneous tissue. The area that is most worrisome is the right fifth metatarsal head which has a wound probes precariously close to bone. There is no purulent drainage or erythema 01/13/16; I'm not exactly sure of the status of the right fifth ray amputation site however he follows with Dr. [name] later this week. The area over his right first plantar metatarsal head, left first and fifth plantar metatarsal head are all in the same status. Thick circumferential callus, nonviable subcutaneous tissue. Culture of the left fifth did not culture last week 01/20/16; all of the patient's wounds appear and roughly the same state although his amputation site on the right lateral foot looks better. His wounds over the right first, left first and left fifth  metatarsal heads all underwent difficult surgical debridement removing circumferential callus nonviable skin and subcutaneous tissue. There is no overt evidence of infection in these areas. MRI at the end of December of the left foot did not show osteomyelitis, right foot showed osteomyelitis of the right fifth digit he is now status post amputation. 01/27/16 the patient's wounds over his plantar first and fifth metatarsal heads on the left all appear better having been started on a total contact cast last week. They were debridement of circumferential callus and nonviable subcutaneous tissue as was the wound over the first metatarsal head. His surgical incision on the right also had some light surface debridement done. 03/23/2016 -- the patient was doing really well and most of his wounds had almost completely healed but now he came back today with a history of having a discharge from the area of his right foot on the plantar aspect and also between his first and second toe. He also has had some discharge from the left foot. Addendum: I spoke to the PA [name] at the dialysis center whose fax number is [phone]. We discussed the infection the patient has and she will put the patient on vancomycin and Fortaz until the final culture report is back. We will fax this as soon as available. 03/30/2016 -- left foot x-ray IMPRESSION:No definitive osteomyelitis noted. X-ray of the right foot -- IMPRESSION: 1. Soft tissue swelling. Prior amputation right fifth digit. No acute or focal bony abnormality identified. If osteomyelitis remains a clinical concern, MRI can be obtained. 2. Peripheral vascular disease. His culture reports have grown an MSSA -- and we will fax this report to his hemodialysis center. He was called in a prescription of oral doxycycline but I have told her not to fill this in as he is already on IV antibiotics. 04/06/2016 -- a few days ago, I spoke to the hemodialysis  center nurse who had stopped the IV antibiotics and he was given a prescription for doxycycline 100 mg by mouth twice a day for a week and he is on this at the present time. 05/11/2016 -- he has recently seen his PCP this week and his hemoglobin A1c was 7. He is working on  his paperwork to get his orthotic shoes. 05/25/2016 -- -- x-ray of the left foot IMPRESSION:No acute bony abnormality. No radiographic changes of acute osteomyelitis. No change since prior study. X-ray of the right foot -- IMPRESSION: Postsurgical changes are seen involving the fifth toe. No evidence of acute osteomyelitis. he developed a large blister on the medial part of his right foot and this opened out and drain fluid. 06/01/2016 -- he has his MRI to be done this afternoon. 06/15/2016 -- MRI of the left forefoot without contrast shows 2 separate regions of cutaneous and subcutaneous edema and possible ulceration and blistering along the ball of the foot. No obvious osteomyelitis identified. MRI of the right foot showed cutaneous and subcutaneous thickening plantar to the first digit sesamoid with an ulcer crater but no underlying osteomyelitis is identified. 06/22/2016 -- the right foot plantar ulcer has been draining a lot of seropurulent material for the last few days. 06/30/2016 -- spoke to the dialysis center and I believe I spoke to McDonald a PA at the center who discussed with me and agreed to putting Legrand Como on vancomycin until his cultures arrive. On review of his culture report no WBCs were seen or no organisms were seen and the culture was reincubated for better growth. The final report is back and there were no predominant growth including Streptococcus or Staphylococcus. Clinically though he has a lot of drainage from both wounds a lot of undermining and there is further blebs on the left foot towards the interspace between his first and second toe. 08/10/2016 -- the culture from the right foot showed normal  skin flora and there was no Staphylococcus aureus was group A streptococcus isolated. 09/21/2016 -- -- MRI of the right foot was done on 09/13/2016 - IMPRESSION: Findings most consistent with acute osteomyelitis throughout the great toe, sesamoid bones and plantar aspect of the head of the first metatarsal. Fluid in the sheath of the flexor tendon of the great toe could be sympathetic but is worrisome for septic tenosynovitis. First MTP joint effusion worrisome for septic joint. He was admitted to the hospital on 09/11/2016 and treated for a fever with vancomycin and cefepime. He was seen by Dr. Bobby Rumpf of infectious disease who recommended 6 weeks treatment with vancomycin and ceftazidime with his hemodialysis and to continue to see as in the wound clinic. Vascular consult was pending. The patient was discharged home on 09/14/2016 and he would continue with IV antibiotics for 6 weeks. 09/28/16 wound appears reasonably healthy. Continuing with total contact cast 10/05/16 wound is smaller and looking healthy. Continue with total contact cast. He continues on IV antibiotics 10/12/2016 -- he has developed a new wound on the dorsal aspect of his left big toe and this is a superficial injury with no surrounding cellulitis. He has completed 30 days of IV antibiotics and is now ready to start his hyperbaric oxygen therapy as per his insurance company's recommendation. 10/19/2016 -- after the cast was removed on the right side he has got good resolution of his ulceration on the right plantar foot. he has a new wound on the left plantar foot in the region of his fourth metatarsal and this will need sharp debridement. 10/26/2016 -- Xray of the right foot complete: IMPRESSION: Changes consistent with osteomyelitis involving the head of the first metatarsal and base of the first proximal phalanx. The sesamoid bones are also likely involved given their positioning. 11/02/2016 -- there still  awaiting insurance clearance for his hyperbaric oxygen therapy and  hopefully he will begin treatment soon. 11/09/2016 -- started with hyperbaric oxygen therapy and had some barotrauma to the right ear and this was seen by ENT who was prescribed Afrin drops and would probably continue with HBO and he is scheduled for myringotomy tubes on Friday 11/16/2016 -- he had his myringotomy tubes placed on Friday and has been doing much better after that with some fluid draining out after hyperbaric treatment today. The pain was much better. 11/30/2016 -- over the last 2 days he noticed a swelling and change of color of his right second toe and this had been draining minimal fluid. 12/07/2016 -- x-ray of the right foot -- IMPRESSION:1. Progressive ulceration at the distal aspect of the second digit with significant soft tissue swelling and osseous changes in the distal phalanx compatible with osteomyelitis. 2. Chronic osteomyelitis at the first MTP joint. 3. Ulcerations at the second and third toes as well without definite osseous changes. Osteomyelitis is not excluded. 4. Fifth digit amputation. On 12/01/2016 oo I spoke to Dr. Bobby Rumpf, the infectious disease specialist, who kindly agreed to treat this with IV antibiotics and he would call in the order to the dialysis center and this has been discussed in detail with the patient who will make the appropriate arrangements. The patient will also book an appointment as soon as possible to see Dr. [name] in the office. Of note the patient has not been on antibiotics this entire week as the dialysis center did not receive any orders from Dr. [name]. I got in touch with Dr. [name] who tells me the patient has an appointment to see him this coming Wednesday. 12/14/2016 -- the patient is on ceftazidime and vancomycin during his dialysis, and I understand this was put on by his nephrologist Dr. Raliegh Ip possibly after speaking with infectious disease Dr.  [name] 12/23/16; patient was on my schedule today for a wound evaluation as he had difficulties with his schedule earlier this week. I note that he is on vancomycin and ceftazidine at dialysis. In spite of this he arrives today with a new wound on the base of the right second toe this easily probes to bone. The known wound at the tip of the second toe With this area. He also has a superficial area on the medial aspect of the third toe on the side of the DIP. This does not appear to have much depth. The area on the plantar left foot is a deep area but did not probe the bone 12/28/16 -- he has brought in some lab work and the most recent labs done showed a hemoglobin of 12.1 hematocrit of 36.3, neutrophils of 51% WBC count of 5.6, BN of 53, albumin of 4.1 globulin of 3.7, vancomycin 13 g per mL. 01/04/2017 -- he saw Dr. [name] who has recommended a amputation of the right second toe and he is awaiting this date. He sees Dr. Bobby Rumpf of infectious disease tomorrow. The bone culture taken on 12/28/2016 had no growth in 2 days. 01/11/2017 -- was seen by Dr. Bobby Rumpf regarding the management and has recommended a eval by vascular surgeons. He recommended to continue the antibiotics during his hemodialysis. Xray of the left foot -- IMPRESSION: No acute fracture, dislocation, or osseous erosion identified. 01/18/2017 -- he has his vascular workup later today and he is going to have his right foot second toe amputation this coming Friday by Dr. [name]. We have also put in for a another 30 treatments with hyperbaric oxygen therapy  01/25/2017 -- I have reviewed Dr. Donnetta Hutching is vascular report from last week where he reviewed him and thought his vascular function was good enough to heal his amputation site and no further tests were recommended. He also had his orthopedic related to surgery which is still pending the notes and we will review these next week. 02/01/2017 -- the operative remote of  Dr. 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 02/08/2017 --he was seen by Dr. [name] on 02/03/2017 -- patient is on [place]. after a thorough review he had recommended to continue with antibiotics during hemodialysis. The patient was seen by Dr. [name], but I do not find any follow-up note on the electronic medical record. he has removed the dressing over the right foot to remove the sutures and asked him to see him back in a month's time 02/22/2017 -- patient has been febrile and has been having symptoms of the upper respiratory tract infection but has not been checked for the flu and it's been over 4 days now. He says he is feeling better today. Some drainage between his left first and second toe and this needed to be looked at 03/08/2017 -- was seen by Dr. Bobby Rumpf on 03/07/2017 after review he will stop his antibiotics and see him back in a month to see if he is feeling well. 03/15/2017 -- he has completed his course of hyperbaric oxygen therapy and is doing fine with his health otherwise. 03/22/2017 -- his nutritionist at the dialysis center has recommended a protein supplement to help build his collagen and we will prescribe this for him when he has the details 05/03/2017 -- he recently noticed the area on the plantar aspect of his fifth metatarsal head which opened out and had minimal drainage. 05/10/2017 -- -- x-ray of the left foot -- IMPRESSION:1. No convincing conventional radiographic evidence of active osteomyelitis.2. Active soft tissue ulceration at the tip of the fifth digit, and at the lateral aspect of the  foot adjacent the base of the fifth metatarsal. 3. Surgical changes of prior fourth toe amputation. 4. 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 5. Small vessel atherosclerotic vascular calcifications. 6. 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 ======= Readmission after 5 weeks: 06/15/2017 -- the patient returns after 5 weeks having had an MRI done on 05/17/2017 MRI of the left foot without contrast showed soft tissue ulcer overlying the base of the fifth metatarsal. 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 No drainable fluid collection to suggest an abscess. He was in hospital between 05/16/2017 and 05/25/2017 -- and on discharge was asked to follow-up with Dr. [name] and Dr. [name]. He was treated 2 weeks post discharge with vancomycin plus ceftriaxone with hemodialysis and oral metronidazole 500 mg 3 times a day. 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 last hemoglobin A1c was 6.7% He had a postoperative MR of the foot on 05/23/2017 -- which showed no new areas of cortical bone loss, edema or soft tissue ulceration to suggest osteomyelitis. the  patient has completed his course of IV antibiotics during dialysis and is to see the infectious disease doctor tomorrow. Dr. [name] had seen him after suture removal and asked him to keep the wound with a dry dressing 06/21/2017 -- he was seen by Dr. Bobby Rumpf of infectious disease on 06/16/2017 -- he stopped his Flagyl and will see him back in 6 weeks. He was asked to follow-up with Dr. [name] and with the wound clinic clinic. 07/05/17; bone biopsy from last time showed acute osteomyelitis. This is from the reminiscent in the left fifth metatarsal. Culture result is apparently still pending [holding for anaerobe}. From my understanding  in this case this is been a progressive necrotic wound which is deteriorated markedly over the last 3 weeks since he returned here. He now has a large area of exposed bone which was biopsied and cultured last week. Dr. Graylon Good has put him on vancomycin and Fortaz during his hemodialysis and Flagyl orally. He is to see Dr. [name] next week 07/19/2017 -- the patient was reviewed by Dr. [name] of orthopedics who reviewed the case in detail and agreed with the plan to continue with IV antibiotics, aggressive wound care and hyperbaric oxygen therapy. He would see him back in 3 weeks' time 08/09/2017 -- saw Dr. Bobby Rumpf on 08/03/2017 -he was restarted on his antibiotics for 6 weeks which included vanco, ceftaz and Flagyl. he recommended continuing his antibiotics for 6 weeks and reevaluate his completion date. He would continue with wound care and hyperbaric oxygen therapy. 08/16/2017 -- I understand he will be completing his 6 weeks of antibiotics sometime later this week. 09/19/2017 -- he has been without his wound VAC for the last week due to lack of supplies. He has been packing his wound with silver alginate. 10/04/2017 -- he has an appointment with Dr. [name] tomorrow and hence we will not apply the wound VAC after his dressing changes today. 10/11/2017 -- he was seen by Dr. Bobby Rumpf on 10/05/2017, noted that the patient was currently off antibiotics, and after thorough review he recommended he follow-up with the wound care as per plan and no further antibiotics at this point. 11/29/17; patient is arrived for the wound on his left lateral foot.Marland Kitchen He is completed IV antibiotics and 2 rounds of hyperbaric oxygen for treatment of underlying osteomyelitis. He arrives today with a surface on most of the wound area however our intake nurse noted drainage from the superior aspect. This was brought to my attention. 12/13/2017 -- the right plantar foot had a large bleb and once the bleb was  opened out a large callus and subcutaneous debris was removed and he has a plantar ulcer near the fourth metatarsal head 12/28/17 on evaluation today patient appears to be doing acutely worse in regard to his left foot. The wound which has been appearing to do better as now open up more deeply there is bone palpable at the base of the wound unfortunately. He tells me that this feels "like it did when he had osteomyelitis previously" he also noted that his second toe on the left foot appears to be doing worse and is swollen there does appear to be some fluid collected underneath. His right foot plantar ulcer appears to be doing somewhat better at this point and there really is no complication at the site currently. No fevers, chills, nausea, or vomiting noted at this time. Patient states that he normally has no pain at this site however. Today he is having significant  pain. 01/03/18; culture done last week showed methicillin sensitive staph aureus and group B strep. He was on Septra however he arrives today with a fever of 101. He has a functional dialysis shunt in his left arm but has had no pain here. No cough. He still makes some urine no dysuria he did have abdominal pain nausea and diarrhea over the weekend but is not had any diarrhea since yesterday. Otherwise he has no specific complaints 01/05/18; the patient returns today in follow-up for his presentation from 1/8. At that point he was febrile. I gave him some Levaquin adjusted for his dialysis status. He tells me the fever broke that night and it is not likely that this was actually a wound infection. He is gone on to have an MRI of the left foot; this showed interval development of an abnormal signal from the base of the third and fourth metatarsal and in the cuboid reminiscent consistent with osteomyelitis. There is no mention of the left second toe I think which was a concern when it was ordered. The patient is taking his Levaquin 01/10/18;  the patient has completed his antibiotics today. The area on the left lateral foot is smaller but it still probes easily to bone. He has underlying osteomyelitis here and sees Dr. Megan Salon of infectious disease this week ooThe area on the right third plantar metatarsal head still requires debridement not much change in dimensions [facility] has a new wound on the medial tip of his right third toe again this probes to bone. He only noticed this 2 days ago 01/17/18; the patient saw Dr. [name] last week and he is back on Vanco and Fortaz at dialysis. 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 We are previously seeing him for a wound also on the base of the fourth med head on the right. X- ray of this area did not show osteomyelitis in relation to a new wound on the tip of his right third toe. Again this probes to bone. Finally his second toe on the left which didn't really show anything on the MRI at least no report appears to have a separated cutaneous area which I think is going to come right off the tip of his toe and leave exposed bone. Whether this is infectious or ischemic I am not clear Culture I did from the third toe last week which was his new wound was negative. Plain x-ray on the right foot did not show osteomyelitis in the toes 01/24/18; the patient is going went to see Dr. [name]. He is going to have a amputation of the left second and right third toe although paradoxically the left second toe looks better than last week. We have treating a deep probing wound on the left lateral foot and the area over the third metatarsal head on the right foot. 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 Also a debridement including bone of the left lateral foot and a closure which is still sutured. This is a bit surprising. Also apparent debridement of the base  of the right third toe wound. 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 I'm  not sure exactly where we are in the frame of things here. The wound on the left lateral foot is still sutured also the amputation sites of the left second and right third toe. He is been using Xeroform to the sutured areas silver alginate to the right foot 02/14/18; he continues on Rocephin at dialysis for osteomyelitis. I still don't have a good sense of where we are in the treatment duration. The wounds on the left lateral foot had the sutures removed and this clearly still probes deeply. We debrided the area with a #5 curet. We'll use silver alginate to the wound oothe area over the right third met head also required debridement of callus skin and subcutaneous tissue and necrotic debris over the wound surface. This tunnels superiorly but I did not unroofed this today. ooThe areas on the tip of his toes have a crusted surface eschar I did not debridement this either. 02/21/18; he continues on Rocephin at dialysis for osteomyelitis. I don't have a good sense of where we are and the treatment duration. The wounds on the left lateral foot is callused over on presentation but requires debridement. The area on the right third med head plantar aspect actually is measuring smaller 02/28/18;patient is on Vanco and Fortaz at dialysis, I'm not sure why I thought this was Rocephin above unless it is been changed. I don't have a good sense of time frame here. Using silver collagen to the area on the left lateral foot and right third med head plantar foot 03/07/18; patient is completing bank and Fortaz at dialysis soon. He is using Silver collagen to the area on the left lateral foot and the right third metatarsal head. He has a smaller  superficial wound distal to the left lateral foot wound. 03/14/18-he is here in follow-up evaluation for multiple ulcerations to his bilateral feet. He presents with a new ulceration to the plantar aspect of the left foot underneath the blister, this was deroofed to reveal a partial thickness ulcer. He is voicing no complaints or concerns, tolerated dialysis yesterday. We will continue with same treatment plan and he will follow-up next week 03/21/18; the patient has a area on the lateral left foot which still has a small probing area. The overall surface area of the wound is better. He presented with a new ulceration on the left foot plantar fifth metatarsal head last week. The area on the right third metatarsal head appears smaller.using silver alginate to all wound areas. The patient is changing the dressing himself. He is using a Darco forefoot offloading are on the right and a healing sandal on the left 03/28/18; original wound left lateral foot. Still nowhere close to looking like a heeling surface oosecondary wound on the left lateral foot at the base of his question fifth metatarsal which was new last week ooRight third metatarsal head. ooWe have been using silver alginate all wounds 04/04/18; the original wound on the left lateral foot again heavy callus surrounding thick nonviable subcutaneous tissue all requiring debridement. The new wound from 2 weeks ago just near this at the base of the fifth metatarsal head still looks about the same ooUnfortunately there is been deterioration in the third medical head wound on the right which now probes to bone. I must say this was a superficial wound at one point in time and I really don't have a good frame of reference year. I'll have to go back to look through records about what we know about the right foot.  He is been previously treated for osteomyelitis of the left lateral foot and he is completing antibiotics vancomycin and Fortaz at  dialysis. He is also been to see Dr. [name]. Somewhere in here as somebody is ordered a VASCULAR evaluation which was done on 03/22/18. On the right is anterior tibial artery was monophasic triphasic at the posterior tibial artery. On the left anterior tibial artery monophasic posterior tibial artery monophasic. ABI in the right was 1.43 on the left 1.21. TBIs on the right at 0.41 and on the left at 0.32. A vascular consult was recommended and I think has been arranged. 04/11/18; Mr. Prout has not had an MRI of the right foot since 2017. Recent x-ray of the right foot done in January and February was negative. However he has had a major deterioration in the wound over the third met head. He is completed antibiotics last week at dialysis [place]. I think he is going to see vascular in follow-up. The areas on the left lateral foot and left plantar fifth metatarsal head both look satisfactory. I debrided both of these areas although the tissue here looks good. The area on the left lateral foot once probe to bone it certainly does not do that now 04/18/18; MRI of the right foot is on Thursday. He has what looks to be serosanguineous purulent drainage coming out of the wound over the right third metatarsal head today. He completed antibiotics bank and Fortaz 2 weeks ago at dialysis. He has a vascular follow-up with regards to his arterial insufficiency although I don't exactly see when that is booked. The areas on the left foot including the lateral left foot and the plantar left fifth metatarsal head look about the same. 04/25/18-He is here in follow-up evaluation for bilateral foot ulcers. MRI obtained was negative for osteomyelitis. Wound culture was negative. We will continue with same treatment plan he'll follow-up next week 05/02/18; the right plantar foot wound over the third metatarsal head actually looks better than when I last saw this. His MRI was negative for osteomyelitis wound culture  was negative. ooOn the left plantar foot both wounds on the plantar fifth metatarsal head and on the lateral foot both are covered in a very hard circumferential callus. 05/09/18; right plantar foot wound over the third metatarsal head stable from last week. ooLeft plantar foot wound over the fifth metatarsal head also stable but with callus around both wound areas ooThe area on the left lateral foot had thick callus over a surface and I had some thoughts about leaving this intact however it felt boggy. ooWe've been using silver alginate all wounds 05/16/18; since the patient was last here he was hospitalized from 5/15 through 5/19. 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 This grew MRSA. He was placed on vancomycin.MRI of the left foot suggested "progressive" osteomyelitis and bone destruction of the cuboid and the base of the fifth metatarsal. Stable erosive changes of the base of the second metatarsal. I'm wondering if they're aware that he had surgery and debridement in the area of the underlying bone previously by Dr. [name]. In any case, He was also revascularized with an angioplasty and stenting of the left tibial peroneal trunk and angioplasty of the left posterior tibial artery. He is on vancomycin at dialysis. He has a 2 week follow-up with infectious disease. He has vascular surgery follow-up. He was  started on Plavix. 05/23/18; the patient's wound on the lateral left foot at the level of the fifth metatarsal head and the plantar wound on the plantar fifth metatarsal head both look better. The area on the right third metatarsal head still has depth and undermining. We've been using silver alginate. He is on vancomycin. He has been revascularized on the left 05/30/18; he continues on vancomycin at dialysis. Revascularized on the left. The area on the left lateral foot is just about  closed. Unfortunately the area over the plantar fifth metatarsal head undermining superiorly as does the area over the right third metatarsal head. Both of these significantly deteriorated from last week. He has appointments next week with infectious disease and orthopedics I delayed putting on a cast on the left until those appointments which therefore we made we'll bring him in on Friday the 14th with the idea of a cast on the left foot 06/06/18; he continues on vancomycin at dialysis. Revascularized on the left. He arrives today with a ballotable swelling just above the area on the left lateral foot where his previous wound was. He has no pain but he is reasonably insensate. The difficult areas on the plantar left fifth metatarsal head looks stable whereas the area on the right third plantar metatarsal head about the same as last week there is undermining here although I did not unroofed this today. He required a considerable debridement of the swelling on the left lateral foot area and I unroofed an abscess with a copious amount of brown purulent material which I obtained for culture. I don't believe during his recent hospitalization he had any further imaging although I need to review this. Previous cultures from this area done in this clinic showed MRSA, I would be surprised if this is not what this is currently even though he is on vancomycin. 06/13/18; he continues on vancomycin at dialysis however he finishes this on Sunday and then graduates the doxycycline previously prescribed by Dr. [name]. The abscess site on the lateral foot that I unroofed last time grew moderate amounts of methicillin-resistant staph aureus that is both vancomycin and tetracycline sensitive so we should be okay from that regard. He arrives today in clinic with a connection between the abscess site and the area on the lateral foot. We've been using silver alginate to all his wound areas. The right third metatarsal  head wound is measuring smaller. Using silver alginate on both wound areas 06/20/18; transitioning to doxycycline prescribed by Dr. [name]. The abscess site on the lateral foot that I unroofed 2 weeks ago grew MRSA. That area has largely closed down although the lateral part of his wound on the fifth metatarsal head still probes to bone. I suspect all of this was connected. ooOn the right third metatarsal head smaller looking wound but surrounded by nonviable tissue that once again requires debridement using silver alginate on both areas 06/27/18; on doxycycline 100 twice a day. The abscess on the lateral foot has closed down. Although he still has the wound on the left fifth metatarsal head that extends towards the lateral part of the foot. The most lateral part of this wound probes to bone ooRight third metatarsal head still a deep probing wound with undermining. Nevertheless I elected not to debridement this this week ooIf everything is copious static next week on the left I'm going to attempt a total contact cast 07/04/18; he is tolerating his doxycycline. The abscess on the left lateral foot is closed down although there  is still a deep wound here that probes to bone. We will use silver collagen under the total contact cast Silver collagen to the deep wound over the right third metatarsal head is well 07/11/18; he is tolerating doxycycline. The left lateral fifth plantar metatarsal head still probes deeply but I could not probe any bone. We've been using silver collagen and this will be the second week under the total contact cast ooSilver collagen to deep wound over the right third metatarsal head as well 07/18/18; he is still taking doxycycline. The left lateral fifth plantar metatarsal this week probes deeply with a large amount of exposed bone. Quite a deterioration. He required extensive debridement. Specimens of bone for pathology and CNS obtained ooAlso considerable debridement on  the right third metatarsal head 07/25/18; bone for pathology last week from the lateral left foot showed osteomyelitis. CandS of the bone Enterobacter. And Klebsiella. He is now on ciprofloxacin and the doxycycline is stopped [Dr. [name] of infectious disease] area He has an appointment with Dr. [name] tomorrow I'm going to leave the total contact cast off for this week. May wish to try to reapply that next week or the week after depending on the wound bed looks. I'm not sure if there is an operative option here, previously is followed with Dr. [name] 08/01/18 on evaluation today patient presents for reevaluation. He has been seen by infectious disease, Dr. [name], and he has placed the patient back on Ceftaz currently to be given to him at dialysis three times a week. 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 Fortunately there does not appear to be the evidence of infection at this point in time. Overall the patient has been tolerating the dressing changes without complication. We are no longer due lives in the cast of left foot secondary to the infection obviously. 08/10/18 on evaluation today patient appears to be doing rather well all things considered in regard to his left plantar foot ulcer. He does have a significant callous on the lateral portion of his left foot he wonders if I can help clean that away to some degree today. Fortunately he is having no evidence of infection at this time which is good news. No fevers chills noted. With that being said his right plantar foot actually does have a significant callous buildup around the wound opening this seems to not be doing as well as I would like at this point. I do believe that he would benefit from sharp debridement at this site. Subsequently I think a total contact cast would be helpful for the right foot as well. 08/17/18; the patient arrived today with a total  contact cast on the right foot. This actually looks quite good. He had gone to see Dr. Megan Salon of infectious disease about the osteomyelitis on the left foot. I think he is on IV Fortaz at dialysis although I am not exactly sure of the rationale for the Fortaz at the time of this dictation. He also arrived in today with a swelling on the lateral left foot this is the site of his original wounds in fact when I first saw this man when he was under Dr. Ardeen Garland care I think this was the site of where the wound was located. It was not particularly tender however using a small scalpel I opened it to [place] some moderate amount of purulent drainage. We've been using silver alginate to all wound areas  and a total contact cast on the right foot 08/24/18; patient continues on [place] at dialysis for osteomyelitis left foot. Last MRI was in May that showed progressive osteomyelitis and bone destruction of the cuboid and base of the fifth metatarsal. Last week he had an abscess over this same area this was removed. Culture was negative. ooWe are continuing with a total contact cast to the area on the third metatarsal head on the right and making some good progress here. The patient asked me again about renal transplant. He is not on the list because of the open wounds. 08/31/18; apparently the patient had an interruption in the [place] but he is now back on this at dialysis. Apparently there was a misunderstanding and Dr. Crissie Figures orders. Due to have the MRI of the left foot tonight. ooThe area on the right foot continues to have callus thick skin and subcutaneous tissue around the wound edge that requires constant debridement however the wound is smaller we are using a total contact cast in this area. Alginate all wounds 09/07/18; without much surprise the MRI of his left foot showed osteomyelitis in the reminiscent of his fourth metatarsal but also the third metatarsal. She arrives in clinic today with the right  foot and a total contact cast. There was purulent drainage coming out of the wound which I have cultured. Marked deterioration here with undermining widely around the wound orifice. Using pickups and a scalpel I remove callus and subcutaneous tissue from a substantial new opening. ooIn a similar fashion the area on the left lateral foot that was blistered last week I have opened this and remove skin and subcutaneous tissue from this area to expose a obvious new wound. I think there is extension and communication between all of this on the left foot. ooThe patient is on [facility] at dialysis. Culture done of the right foot. We are clearly not making progress here. ooWe have made him an appointment with Dr. [name] of orthopedics. I think an amputation of the left leg may be discussed. I don't think there is anything that can be done with foot salvage. 09/14/18; culture from the right foot last time showed a very resistant MRSA. This is resistant to doxycycline. I'm going to try to get linezolid at least for a week. He does not have a appointment with Dr. [name] yet. 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 I'll send a message to Dr. [name] of infectious disease. I want the patient to see Dr. [name] to go over the pros and cons of an amputation which I think will be a BKA. The patient had a question about whether this is curative or not. I told him that I thought it would be although spread of staph aureus infection is not unheard of. MRI of the right foot was done at the end of April 2019 did not show osteomyelitis. The patient last saw Dr. [name] on 9/3. I'll send Dr. [name] a message. I would really like him to weigh the pros and cons of a BKA on the left otherwise he'll probably need IV antibiotics and perhaps hyperbaric oxygen again. He has not had a good response to this in the past. His MRI earlier this month showed progressive  damage in the remnants of the fourth and third metatarsal 09/21/2018; sees Dr. [name] of orthopedics next week to discuss a left BKA in response to the osteomyelitis in the left foot. Using  silver alginate to all wound areas. He is completing the Zyvox I did put in for him after last culture showed MRSA 09/29/2018; patient saw Dr. [name] of orthopedics discussed the osteomyelitis in the left foot third and fourth metatarsals. He did not recommend urgent surgery but certainly stated the only surgical option would be a BKA. He is communicating with Dr. [name]. The problem here is the instability of the areas on his foot which constantly generate draining abscesses. I will communicate with Dr. [name] about this. He has an appointment on 10/23 10/05/2018; patient sees Dr. [name] on 10/23. I have sent him a secure message not ordered any additional antibiotics for now. Patient continues to have 2 open areas on the left plantar foot at the fifth metatarsal head and the lateral aspect of the foot. These have not changed all that much. The area on the right third met head still has thick callus and surrounding subcutaneous tissue we have been using silver alginate 10/12/2018; patient sees Dr. [name] or colleague on 10/23. 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 The area on the right plantar foot is just about closed. The superior wound on the left fifth metatarsal head is callused over but I am not sure if this is closed. The area below it is about the same. Considerable amount of callus on the lateral foot. We have been using silver alginate to the wounds 10/19/2018; the patient saw Dr. [name] on 10/22. He wishes to try and continue to save the left foot. He has been given 6 weeks of oral ciprofloxacin. We continue to put a total contact cast on the right foot. The area over the fifth metatarsal head on the left is callused  over/perhaps healed but I did not remove the callus to find out. He still has the wound on the left lateral midfoot requiring debridement. We have been using silver alginate to all wound areas 10/26/2018 ooOn the left foot our intake nurse noted some purulent drainage from the inferior wound [currently the only one that is not callused over] ooOn the right foot even though he is in total contact cast a considerable amount of thick black callus and surface eschar. On this side we have been using silver alginate under a total contact cast. oohe remains on ciprofloxacin as prescribed by Dr. [name] 11/02/2018; culture from last week which is done of the probing area on the left midfoot wound showed MRSA "few". I am going to need to contact Dr. [name] which I will do today I think he is going to need IV antibiotics again. The area on the left foot which was callused on the side is clearly separating today all of this was removed a copious amounts of callus and necrotic subcutaneous tissue. ooThe area on the right plantar foot actually remained healthy looking with a healthy granulated base. ooWe are using silver alginate to all wound areas 11/09/2018; unfortunately neither 1 of the patient's wounds areas looks at all satisfactory. On the left he has considerable necrotic debris from the plantar wound laterally over the foot. I removed copious amounts of material including callus skin and subcutaneous tissue. On the right foot he arrives with undermining and frankly purulent drainage. Specimen obtained for culture and debridement of the callused skin and subcutaneous tissue from around the circumference. Because of this I cannot put him back in a total contact cast but to be truthful we are really unfortunately not  making a lot of progress I did put in a secure text message to Dr. [name] wondering about the MRSA on the left foot. He suggested vancomycin although this is not started. I was left  wondering if he expected me to send this into dialysis. Has we have more purulence on the right side wait for that result before calling dialysis 11/16/2018; I called dialysis earlier this week to get the vancomycin started. I think he got the first dose on Tuesday. The patient tells me that he fell earlier this week twisting his left foot and ankle and he is swelling. He continues to not look well. He had blood cultures done at dialysis on Tuesday he is not been informed of the results. 12/07/2018; the patient was admitted to hospital from 11/22/2018 through 12/02/2018. He underwent a left BKA. Apparently had staph sepsis. In the meantime being off the right foot this is closed over. He follows up with Dr. 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 Subsequent to this he actually had multiple falls in fact he tells me to following which calls the wound to be his apparently. He has been seeing Dr. [name] and his physician assistant in the interim. They actually did want him to come back for reevaluation to see if there's anything we can do for me wound care perspective the help this area to heal more appropriately. The patient states that he does not have a tremendous amount of pain which is good news. No fevers, chills, nausea, or vomiting noted at this time. We have gotten approval from Dr. [name] office had [place] for [place] to treat the patient for his stop wound. This is due to the fact that the patient was in the 90 day postop global. 1/21; the patient does not have an open wound on the right foot although he does have some pressure areas that will need to be padded when he is transferring. The dehisced surgical wound looks clean although there is some undermining of 1.5 cm superiorly. We have been using calcium alginate 1/28; patient arrives in our clinic for review of  the left BKA stump wound. This appears healthy but there is undermining. TheraSkin #1 2/11; TheraSkin #2 2/25; TheraSkin #3. Wound is measuring smaller 3/10; TheraSkin #4 wound is measuring smaller 3/24; TheraSkin #5 wound is measuring smaller and looks healthy. 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 He has no open area on the right foot although he does have some callused area over the original third metatarsal head wound. The third metatarsal is also subluxed on the right foot. I have warned him today that he cannot consider wearing a prosthesis for at least a month but he can go for measurements. He is going to need to keep the right foot padded is much as possible in his diabetic shoe indefinitely READMISSION 06/12/2019 Mr. Kopinski is a type II diabetic on dialysis. He has been in this clinic multiple times with wounds on his bilateral lower extremities. 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 He also has a history of wounds on his right foot but tells me that everything is going well here. He actually came in here with his prosthesis walking with a cane. We were all  really quite gratified to see this He states over the last several weeks he has had a painful area at the tip of the left third finger. His dialysis shunt in his is in the left upper arm and this particularly hurts during dialysis when his fingers get numb and there is a lot of pain in the tip of his left third finger. I believe he is seen vascular surgery. He had a Doppler done to evaluate for a dialysis steal syndrome. He is going for a banding procedure 1 week from today towards the left AV fistula. I think if that is unsuccessful they will be looking at creating a new shunt. He had a course of doxycycline when he which he finished about a week ago. He has been using Bactroban to the  finger 6/25; the patient had his banding procedure and things seem to be going better. He is not having pain in his hand at dialysis. The area on the tip of his finger seems about closed. He did however have a paronychia I the last time I saw him. I have been advising him to use topical Bactroban washing the finger. Culture I did last time showed a few staph epidermidis which I was willing to dismiss as superficial skin contaminant. He arrives today with the finger wound looking better however the paronychia a seems worse 7/9; 2-week follow-up. He does not have an open wound on the tip of his third left finger. I thought he had an initial ischemic wound on the tip of the finger as well as a paronychia a medially. All of this appears to be closed. 8/6-Patient returns after 3 weeks for follow-up and has a right second met head plantar callus with a significant area of maceration hiding an ulcer ABI repeated today on right - 1.26 8/13-Patient returns at 1 week, the right second met head plantar wound appears to be slightly better, it is surrounded by the callus area we are using silver alginate 8/20; the patient came back to clinic 2 weeks ago with a wound on the right second metatarsal head. He apparently told me he developed callus in this area but also went away from his custom shoes to a pair of running shoes. Using silver alginate. 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 There might be much we can do to offload this although the patient tells me he has a wheelchair that he is using to try and stay off the wound is much as possible 8/27; right second metatarsal head. Still small punched out area with thick callus and subcutaneous tissue around the wound. We have been using silver collagen. This is always been an issue with this man's wound especially on this foot 9/3; right second met head. Still the same punched-out area with thick callus and subcutaneous tissue around  the wound removing the circumference demonstrates repetitively undermining area. I have removed all the subcutaneous tissue associated with this. We have been using silver collagen 9/15; right second metatarsal head. Same punched-out thick callus and subcutaneous tissue around the wound area. Still requiring debridement we have been using silver collagen I changed back to silver alginate X-ray last time showed no evidence of osteomyelitis. Culture showed Klebsiella and he was started and Keflex apparently just 4 days ago 9/24; right second metatarsal head. He is completed the antibiotics I gave him for 10 days. Same punched-out thick callus and subcutaneous tissue around the area. Still requiring debridement. We have been  using silver alginate. The area itself looks swollen and this could be just [place] that comes on this area with walking on a prosthesis on the left however I think he needs an MRI and I am going to order that today. There is not an option to offload this further 10/1; right second plantar metatarsal head. MRI booked for October 6. Generally looking better today using silver alginate 10/8; right second plantar metatarsal head. MRI that was done on 10/6 was negative for osteomyelitis noted prior amputations of the second and fourth toes at the [facility]. Prior amputation of the third toe to the base of the middle phalanx. Prior amputation of the fifth toe to the head of the metatarsal. 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 He does not recall about hearing about this. He did not have any pain but then again he has reduced sensation from diabetic neuropathy 10/15; right second plantar metatarsal head. Still in the same condition callus nonviable subcutaneous tissue which cleans up nicely with debridement but reforms by the next week. The patient tells me that he is offloading this is much as he can including taking his wheelchair to  dialysis. We have been using silver alginate, changed to silver collagen today 10/22; right second plantar metatarsal head. Wound measures smaller but still thick callus around this wound. Still requiring debridement 11/5 right second plantar metatarsal head. Less callus around the wound circumference but still requiring debridement. There is still some undermining which I cleaned out the wound looks healthy but still considerable punched out depth with a relatively small circumference to the wound there is no palpable bone no purulent drainage 11/12; right second plantar metatarsal head. Small wound with thick callus tissue around the wound and significant undermining. We have been using silver collagen after my continuous debridements of this area 11/19; patient arrives in today with purulent drainage coming out of the wound in the second third met head area on the right foot. Serosanguineous thick drainage. Specimen obtained for culture. 11/21/2019 on evaluation today patient appears to be doing well with regard to his foot ulcer. The good news is is culture came back negative for any bacteria there was no growth. The other good news is his x-ray also appears to be doing well. The foot is also measuring much better than what it was. Overall I am very pleased with how things seem to be progressing. 12/22; patient was in [facility] for several weeks due to the death of a family member. He said he stayed off the wound using silver alginate. Objective Constitutional Patient is hypotensive. However he appears well. Pulse regular and within target range for patient.Marland Kitchen Respirations regular, non-labored and within target range.. Temperature is normal and within the target range for the patient.Marland Kitchen Appears in no distress. Vitals Time Taken: 1:25 PM, Height: 73 in, Source: Stated, Weight: 202 lbs, Source: Stated, BMI: 26.6, Temperature: 98.5 F, Pulse: 77 bpm, Respiratory Rate: 18 breaths/min,  Blood Pressure: 94/50 mmHg, Capillary Blood Glucose: 123 mg/dl. General Notes: glucose per pt report General Notes: Wound exam; large area of thick nonviable skin over the wound area with a small opening in the middle. There was 360 degree undermining. Using a pickup and a #15 scalpel this was removed. This allows full access to the wound which is probably about the same as last time I saw this. There is no evidence of infection. Integumentary (Hair, Skin) Wound #41 status is Open. Original cause  of wound was Gradually Appeared. The wound is located on the Right Metatarsal head second. The wound measures 0.5cm length x 0.4cm width x 0.9cm depth; 0.157cm^2 area and 0.141cm^3 volume. There is Fat Layer (Subcutaneous Tissue) Exposed exposed. There is no tunneling noted, however, there is undermining starting at 12:00 and ending at 12:00 with a maximum distance of 1.2cm. There is a small amount of serosanguineous drainage noted. The wound margin is well defined and not attached to the wound base. There is large (67-100%) red granulation within the wound bed. There is a small (1-33%) amount of necrotic tissue within the wound bed including Adherent Slough. 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 There was a Excisional Skin/Subcutaneous Tissue Debridement with a total area of 0.2 sq cm performed by Ricard Dillon., MD. With the following instrument(s): Blade, and Forceps to remove Viable and Non-Viable tissue/material. Material removed includes Callus, Subcutaneous Tissue,  and Slough after achieving pain control using Other (benzocaine 20%). No specimens were taken. A time out was conducted at 13:55, prior to the start of the procedure. A Minimum amount of bleeding was controlled with Pressure. The procedure was tolerated well with a pain level of 0 throughout and a pain level of 0 following the procedure. Post Debridement Measurements: 0.5cm length x 0.4cm width x 0.9cm depth; 0.141cm^3 volume. Character of Wound/Ulcer Post Debridement is improved. Severity of Tissue Post Debridement is: Fat layer exposed. Post procedure Diagnosis Wound #41: Same as Pre-Procedure Plan Follow-up Appointments: Return Appointment in 2 weeks. Dressing Change Frequency: Wound #41 Right Metatarsal head second: Change Dressing every other day. Skin Barriers/Peri-Wound Care: Barrier cream - as needed for maceration, wetness, or redness. Wound Cleansing: May shower and wash wound with soap and water. Primary Wound Dressing: Wound #41 Right Metatarsal head second: Silver Collagen - moisten with normal saline or KY jelly Secondary Dressing: Wound #41 Right Metatarsal head second: Kerlix/Rolled Gauze Dry Gauze Foam Border - patient may use bordered foam to cut into a felt to offload. Other: - felt to periwound and place one below wound. lightly wrap coban to hold dressing in place. felt / pad wound Edema Control: Avoid standing for long periods of time Elevate legs to the level of the heart or above for 30 minutes daily and/or when sitting, a frequency of: - throughout the day. Off-Loading: Other: - felt to periwound and place one below wound. Additional Orders / Instructions: Other: - minimize walking and standing on foot to aid in wound healing and callus buildup. 1. Change the primary dressing to silver collagen with border foam. 2. There is no palpable bone and no evidence of infection 3. He has a left BKA there is no way to adequately offload this foot. Electronic  Signature(s) Signed: 12/18/2019 6:32:44 PM By: Linton Ham MD Entered By: Linton Ham on 12/18/2019 14:25:10 -------------------------------------------------------------------------------- SuperBill Details Patient Name: Date of Service: DAJON, LAZAR 12/18/2019 Medical Record [id number] Patient Account Number: [id number] Date of Birth/Sex: Treating RN: [birth year] (68 y.o. M) Primary Care Provider: [name] Other Clinician: Referring Provider: Treating Provider/Extender:Allena Pietila, Jeronimo Greaves, Buena Irish in Treatment: 27 Diagnosis Coding ICD-10 Codes Code Description  E11.621 Type 2 diabetes mellitus with foot ulcer L97.512 Non-pressure chronic ulcer of other part of right foot with fat layer exposed I70.298 Other atherosclerosis of native arteries of extremities, other extremity L03.115 Cellulitis of right lower limb Facility Procedures Physician Procedures CPT4 Code Description: [id number] - WC PHYS SUBQ TISS 20 SQ CM ICD-10 Diagnosis Description L97.512 Non-pressure chronic ulcer of other part of right foot w E11.621 Type 2 diabetes mellitus with foot ulcer Modifier: ith fat layer e Quantity: 1 xposed Electronic Signature(s) Signed: 12/18/2019 6:32:44 PM By: Linton Ham MD Entered By: Linton Ham on 12/18/2019 14:25:37
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
## 2020-01-03 ENCOUNTER — Other Ambulatory Visit: Payer: Self-pay

## 2020-01-03 ENCOUNTER — Encounter (HOSPITAL_BASED_OUTPATIENT_CLINIC_OR_DEPARTMENT_OTHER): Payer: Medicare Other | Attending: Internal Medicine | Admitting: Internal Medicine

## 2020-01-03 DIAGNOSIS — Z89512 Acquired absence of left leg below knee: Secondary | ICD-10-CM | POA: Insufficient documentation

## 2020-01-03 DIAGNOSIS — E1140 Type 2 diabetes mellitus with diabetic neuropathy, unspecified: Secondary | ICD-10-CM | POA: Insufficient documentation

## 2020-01-03 DIAGNOSIS — L97512 Non-pressure chronic ulcer of other part of right foot with fat layer exposed: Secondary | ICD-10-CM | POA: Insufficient documentation

## 2020-01-03 DIAGNOSIS — Z7902 Long term (current) use of antithrombotics/antiplatelets: Secondary | ICD-10-CM | POA: Insufficient documentation

## 2020-01-03 DIAGNOSIS — E11621 Type 2 diabetes mellitus with foot ulcer: Secondary | ICD-10-CM | POA: Diagnosis not present

## 2020-01-03 DIAGNOSIS — D631 Anemia in chronic kidney disease: Secondary | ICD-10-CM | POA: Insufficient documentation

## 2020-01-03 DIAGNOSIS — I129 Hypertensive chronic kidney disease with stage 1 through stage 4 chronic kidney disease, or unspecified chronic kidney disease: Secondary | ICD-10-CM | POA: Diagnosis not present

## 2020-01-03 DIAGNOSIS — M1990 Unspecified osteoarthritis, unspecified site: Secondary | ICD-10-CM | POA: Diagnosis not present

## 2020-01-03 DIAGNOSIS — L03115 Cellulitis of right lower limb: Secondary | ICD-10-CM | POA: Diagnosis not present

## 2020-01-03 DIAGNOSIS — E1122 Type 2 diabetes mellitus with diabetic chronic kidney disease: Secondary | ICD-10-CM | POA: Insufficient documentation

## 2020-01-03 DIAGNOSIS — N189 Chronic kidney disease, unspecified: Secondary | ICD-10-CM | POA: Insufficient documentation

## 2020-01-03 DIAGNOSIS — E1151 Type 2 diabetes mellitus with diabetic peripheral angiopathy without gangrene: Secondary | ICD-10-CM | POA: Diagnosis not present

## 2020-01-04 NOTE — Progress Notes (Signed)
[name], [name] ([id number]) Visit Report for 01/03/2020 HPI Details Patient Name: Date of Service: [name], [name] 01/03/2020 1:00 PM Medical Record [id number] Patient Account Number: [id number] Date of Birth/Sex: Treating RN: 1951/07/09 (69 y.o. M) Primary Care Provider: [name] Other Clinician: Referring Provider: Treating Provider/Extender:Skye Plamondon, Jeronimo Greaves, Buena Irish in Treatment: 29 History of Present Illness Location: Patient presents with a wound to bilateral feet. Quality: Patient reports experiencing essentially no pain. Severity: Mildly severe wound with no evidence of infection Duration: Patient has had the wound for greater than 2 weeks prior to presenting for treatment HPI Description: The patient is a pleasant 69 yrs old bm here for evaluation of ulcers on the plantar aspect of both feet. 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 He has a left arm graft for access. He has been trying to stay off his feet for weeks but does not seem to have any improvement in the wound. He has been seeing someone at the foot center and was referred to the Wound Care center for further evaluation. 12/30/15 the patient has 3 wounds one over the right first metatarsal head and 2 on the left foot at the left fifth and lleft first metatarsal head. All of these look relatively similar. The one over the left fifth probe to bone I could not prove that any of the others did. I note his MRI in November that did not show osteomyelitis. His peripheral pulses seem robust. All of these underwent surgical debridement to remove callus nonviable skin and subcutaneous tissue 01/06/16; the patient had his sutures removed from the right fifth ray amputation. There may be a small open part of this superiorly but otherwise the incision looks good. Areas over his right first, left first and left fifth metatarsal head all underwent surgical  debridement as a varying degree of callus, skin and nonviable subcutaneous tissue. The area that is most worrisome is the right fifth metatarsal head which has a wound probes precariously close to bone. There is no purulent drainage or erythema 01/13/16; I'm not exactly sure of the status of the right fifth ray amputation site however he follows with Dr. [name] later this week. The area over his right first plantar metatarsal head, left first and fifth plantar metatarsal head are all in the same status. Thick circumferential callus, nonviable subcutaneous tissue. Culture of the left fifth did not culture last week 01/20/16; all of the patient's wounds appear and roughly the same state although his amputation site on the right lateral foot looks better. His wounds over the right first, left first and left fifth metatarsal heads all underwent difficult surgical debridement removing circumferential callus nonviable skin and subcutaneous tissue. There is no overt evidence of infection in these areas. MRI at the end of December of the left foot did not show osteomyelitis, right foot showed osteomyelitis of the right fifth digit he is now status post amputation. 01/27/16 the patient's wounds over his plantar first and fifth metatarsal heads on the left all appear better having been started on a total contact cast last week. They were debridement of circumferential callus and nonviable subcutaneous tissue as was the wound over the first metatarsal head. His surgical incision on the right also had some light surface debridement done. 03/23/2016 -- the patient was doing really well and most of his wounds had almost completely healed but now he came back today with a history of having a discharge from the area  of his right foot on the plantar aspect and also between his first and second toe. He also has had some discharge from the left foot. Addendum: I spoke to the PA [name] at the dialysis center  whose fax number is [phone]. We discussed the infection the patient has and she will put the patient on vancomycin and Fortaz until the final culture report is back. We will fax this as soon as available. 03/30/2016 -- left foot x-ray IMPRESSION:No definitive osteomyelitis noted. X-ray of the right foot -- IMPRESSION: 1. Soft tissue swelling. Prior amputation right fifth digit. No acute or focal bony abnormality identified. If osteomyelitis remains a clinical concern, MRI can be obtained. 2. Peripheral vascular disease. His culture reports have grown an MSSA -- and we will fax this report to his hemodialysis center. He was called in a prescription of oral doxycycline but I have told her not to fill this in as he is already on IV antibiotics. 04/06/2016 -- a few days ago, I spoke to the hemodialysis center nurse who had stopped the IV antibiotics and he was given a prescription for doxycycline 100 mg by mouth twice a day for a week and he is on this at the present time. 05/11/2016 -- he has recently seen his PCP this week and his hemoglobin A1c was 7. He is working on his paperwork to get his orthotic shoes. 05/25/2016 -- -- x-ray of the left foot IMPRESSION:No acute bony abnormality. No radiographic changes of acute osteomyelitis. No change since prior study. X-ray of the right foot -- IMPRESSION: Postsurgical changes are seen involving the fifth toe. No evidence of acute osteomyelitis. he developed a large blister on the medial part of his right foot and this opened out and drain fluid. 06/01/2016 -- he has his MRI to be done this afternoon. 06/15/2016 -- MRI of the left forefoot without contrast shows 2 separate regions of cutaneous and subcutaneous edema and possible ulceration and blistering along the ball of the foot. No obvious osteomyelitis identified. MRI of the right foot showed cutaneous and subcutaneous thickening plantar to the first digit sesamoid with an ulcer crater but no  underlying osteomyelitis is identified. 06/22/2016 -- the right foot plantar ulcer has been draining a lot of seropurulent material for the last few days. 06/30/2016 -- spoke to the dialysis center and I believe I spoke to [place] a PA at the center who discussed with me and agreed to putting Legrand Como on vancomycin until his cultures arrive. On review of his culture report no WBCs were seen or no organisms were seen and the culture was reincubated for better growth. The final report is back and there were no predominant growth including Streptococcus or Staphylococcus. Clinically though he has a lot of drainage from both wounds a lot of undermining and there is further blebs on the left foot towards the interspace between his first and second toe. 08/10/2016 -- the culture from the right foot showed normal skin flora and there was no Staphylococcus aureus was group A streptococcus isolated. 09/21/2016 -- -- MRI of the right foot was done on 09/13/2016 - IMPRESSION: Findings most consistent with acute osteomyelitis throughout the great toe, sesamoid bones and plantar aspect of the head of the first metatarsal. Fluid in the sheath of the flexor tendon of the great toe could be sympathetic but is worrisome for septic tenosynovitis. First MTP joint effusion worrisome for septic joint. He was admitted to the hospital on 09/11/2016 and treated for a fever  with vancomycin and cefepime. He was seen by Dr. Bobby Rumpf of infectious disease who recommended 6 weeks treatment with vancomycin and ceftazidime with his hemodialysis and to continue to see as in the wound clinic. Vascular consult was pending. The patient was discharged home on 09/14/2016 and he would continue with IV antibiotics for 6 weeks. 09/28/16 wound appears reasonably healthy. Continuing with total contact cast 10/05/16 wound is smaller and looking healthy. Continue with total contact cast. He continues on IV antibiotics 10/12/2016 --  he has developed a new wound on the dorsal aspect of his left big toe and this is a superficial injury with no surrounding cellulitis. He has completed 30 days of IV antibiotics and is now ready to start his hyperbaric oxygen therapy as per his insurance company's recommendation. 10/19/2016 -- after the cast was removed on the right side he has got good resolution of his ulceration on the right plantar foot. he has a new wound on the left plantar foot in the region of his fourth metatarsal and this will need sharp debridement. 10/26/2016 -- Xray of the right foot complete: IMPRESSION: Changes consistent with osteomyelitis involving the head of the first metatarsal and base of the first proximal phalanx. The sesamoid bones are also likely involved given their positioning. 11/02/2016 -- there still awaiting insurance clearance for his hyperbaric oxygen therapy and hopefully he will begin treatment soon. 11/09/2016 -- started with hyperbaric oxygen therapy and had some barotrauma to the right ear and this was seen by ENT who was prescribed Afrin drops and would probably continue with HBO and he is scheduled for myringotomy tubes on Friday 11/16/2016 -- he had his myringotomy tubes placed on Friday and has been doing much better after that with some fluid draining out after hyperbaric treatment today. The pain was much better. 11/30/2016 -- over the last 2 days he noticed a swelling and change of color of his right second toe and this had been draining minimal fluid. 12/07/2016 -- x-ray of the right foot -- IMPRESSION:1. Progressive ulceration at the distal aspect of the second digit with significant soft tissue swelling and osseous changes in the distal phalanx compatible with osteomyelitis. 2. Chronic osteomyelitis at the first MTP joint. 3. Ulcerations at the second and third toes as well without definite osseous changes. Osteomyelitis is not excluded. 4. Fifth digit amputation. On 12/01/2016  I spoke to Dr. Bobby Rumpf, the infectious disease specialist, who kindly agreed to treat this with IV antibiotics and he would call in the order to the dialysis center and this has been discussed in detail with the patient who will make the appropriate arrangements. The patient will also book an appointment as soon as possible to see Dr. [name] in the office. Of note the patient has not been on antibiotics this entire week as the dialysis center did not receive any orders from Dr. [name]. I got in touch with Dr. [name] who tells me the patient has an appointment to see him this coming Wednesday. 12/14/2016 -- the patient is on ceftazidime and vancomycin during his dialysis, and I understand this was put on by his nephrologist Dr. Raliegh Ip possibly after speaking with infectious disease Dr. [name] 12/23/16; patient was on my schedule today for a wound evaluation as he had difficulties with his schedule earlier this week. I note that he is on vancomycin and ceftazidine at dialysis. In spite of this he arrives today with a new wound on the base of the right second toe this easily  probes to bone. The known wound at the tip of the second toe With this area. He also has a superficial area on the medial aspect of the third toe on the side of the DIP. This does not appear to have much depth. The area on the plantar left foot is a deep area but did not probe the bone 12/28/16 -- he has brought in some lab work and the most recent labs done showed a hemoglobin of 12.1 hematocrit of 36.3, neutrophils of 51% WBC count of 5.6, BN of 53, albumin of 4.1 globulin of 3.7, vancomycin 13 g per mL. 01/04/2017 -- he saw Dr. [name] who has recommended a amputation of the right second toe and he is awaiting this date. He sees Dr. Bobby Rumpf of infectious disease tomorrow. The bone culture taken on 12/28/2016 had no growth in 2 days. 01/11/2017 -- was seen by Dr. Bobby Rumpf regarding the management and has  recommended a eval by vascular surgeons. He recommended to continue the antibiotics during his hemodialysis. Xray of the left foot -- IMPRESSION: No acute fracture, dislocation, or osseous erosion identified. 01/18/2017 -- he has his vascular workup later today and he is going to have his right foot second toe amputation this coming Friday by Dr. [name]. We have also put in for a another 30 treatments with hyperbaric oxygen therapy 01/25/2017 -- I have reviewed Dr. Donnetta Hutching is vascular report from last week where he reviewed him and thought his vascular function was good enough to heal his amputation site and no further tests were recommended. He also had his orthopedic related to surgery which is still pending the notes and we will review these next week. 02/01/2017 -- the operative remote of Dr. 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 02/08/2017 --he was seen by Dr. [name] on 02/03/2017 -- patient is on Seely. after a thorough review he had recommended to continue with antibiotics during hemodialysis. The patient was seen by Dr. [name], but I do not find any follow-up note on the electronic medical record. he has removed the dressing over the right foot to remove the sutures and asked him to see him back in a month's time 02/22/2017 -- patient has been febrile and has been having symptoms of the upper respiratory tract infection but has not been checked for the flu and it's been over 4 days now. He says he is feeling better today. Some drainage between his left first and second toe and this needed to be looked at 03/08/2017 -- was  seen by Dr. Bobby Rumpf on 03/07/2017 after review he will stop his antibiotics and see him back in a month to see if he is feeling well. 03/15/2017 -- he has completed his course of hyperbaric oxygen therapy and is doing fine with his health otherwise. 03/22/2017 -- his nutritionist at the dialysis center has recommended a protein supplement to help build his collagen and we will prescribe this for him when he has the details 05/03/2017 -- he recently noticed the area on the plantar aspect of his fifth metatarsal head which opened out and had minimal drainage. 05/10/2017 -- -- x-ray of the left foot -- IMPRESSION:1.  No convincing conventional radiographic evidence of active osteomyelitis.2. Active soft tissue ulceration at the tip of the fifth digit, and at the lateral aspect of the foot adjacent the base of the fifth metatarsal. 3. Surgical changes of prior fourth toe amputation. 4. 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 5. Small vessel atherosclerotic vascular calcifications. 6. 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 ======= Readmission after 5 weeks: 06/15/2017 -- the patient returns after 5 weeks having had an MRI done on 05/17/2017 MRI of the left foot without contrast showed soft tissue ulcer overlying the base of the fifth metatarsal. 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 No drainable fluid collection to suggest an abscess. He was in hospital between 05/16/2017 and 05/25/2017 -- and on discharge was asked to follow-up with Dr. [name] and Dr. [name]. He was treated 2 weeks post discharge with vancomycin plus ceftriaxone with hemodialysis and oral metronidazole 500 mg 3 times a day. 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 last hemoglobin A1c was 6.7% He had a  postoperative MR of the foot on 05/23/2017 -- which showed no new areas of cortical bone loss, edema or soft tissue ulceration to suggest osteomyelitis. the patient has completed his course of IV antibiotics during dialysis and is to see the infectious disease doctor tomorrow. Dr. [name] had seen him after suture removal and asked him to keep the wound with a dry dressing 06/21/2017 -- he was seen by Dr. Bobby Rumpf of infectious disease on 06/16/2017 -- he stopped his Flagyl and will see him back in 6 weeks. He was asked to follow-up with Dr. [name] and with the wound clinic clinic. 07/05/17; bone biopsy from last time showed acute osteomyelitis. This is from the reminiscent in the left fifth metatarsal. Culture result is apparently still pending [holding for anaerobe}. From my understanding in this case this is been a progressive necrotic wound which is deteriorated markedly over the last 3 weeks since he returned here. He now has a large area of exposed bone which was biopsied and cultured last week. Dr. Graylon Good has put him on vancomycin and Fortaz during his hemodialysis and Flagyl orally. He is to see Dr. [name] next week 07/19/2017 -- the patient was reviewed by Dr. [name] of orthopedics who reviewed the case in detail and agreed with the plan to continue with IV antibiotics, aggressive wound care and hyperbaric oxygen therapy. He would see him back in 3 weeks' time 08/09/2017 -- saw Dr. Bobby Rumpf on 08/03/2017 -he was restarted on his antibiotics for 6 weeks which included vanco, ceftaz and Flagyl. he recommended continuing his antibiotics for 6 weeks and reevaluate his completion date. He would continue with wound care and hyperbaric oxygen therapy. 08/16/2017 -- I understand he will be completing his 6 weeks of antibiotics sometime later this week. 09/19/2017 -- he has been without his wound VAC for the last week due to lack of supplies. He has been packing his wound with silver  alginate. 10/04/2017 -- he has an appointment with Dr. [name] tomorrow and hence we will not apply the wound VAC after his dressing changes today. 10/11/2017 -- he was seen by Dr. Bobby Rumpf on 10/05/2017, noted that the patient was currently off antibiotics, and after thorough review he recommended he follow-up with the wound care as per plan and no further  antibiotics at this point. 11/29/17; patient is arrived for the wound on his left lateral foot.Marland Kitchen He is completed IV antibiotics and 2 rounds of hyperbaric oxygen for treatment of underlying osteomyelitis. He arrives today with a surface on most of the wound area however our intake nurse noted drainage from the superior aspect. This was brought to my attention. 12/13/2017 -- the right plantar foot had a large bleb and once the bleb was opened out a large callus and subcutaneous debris was removed and he has a plantar ulcer near the fourth metatarsal head 12/28/17 on evaluation today patient appears to be doing acutely worse in regard to his left foot. The wound which has been appearing to do better as now open up more deeply there is bone palpable at the base of the wound unfortunately. He tells me that this feels "like it did when he had osteomyelitis previously" he also noted that his second toe on the left foot appears to be doing worse and is swollen there does appear to be some fluid collected underneath. His right foot plantar ulcer appears to be doing somewhat better at this point and there really is no complication at the site currently. No fevers, chills, nausea, or vomiting noted at this time. Patient states that he normally has no pain at this site however. Today he is having significant pain. 01/03/18; culture done last week showed methicillin sensitive staph aureus and group B strep. He was on Septra however he arrives today with a fever of 101. He has a functional dialysis shunt in his left arm but has had no pain here. No  cough. He still makes some urine no dysuria he did have abdominal pain nausea and diarrhea over the weekend but is not had any diarrhea since yesterday. Otherwise he has no specific complaints 01/05/18; the patient returns today in follow-up for his presentation from 1/8. At that point he was febrile. I gave him some Levaquin adjusted for his dialysis status. He tells me the fever broke that night and it is not likely that this was actually a wound infection. He is gone on to have an MRI of the left foot; this showed interval development of an abnormal signal from the base of the third and fourth metatarsal and in the cuboid reminiscent consistent with osteomyelitis. There is no mention of the left second toe I think which was a concern when it was ordered. The patient is taking his Levaquin 01/10/18; the patient has completed his antibiotics today. The area on the left lateral foot is smaller but it still probes easily to bone. He has underlying osteomyelitis here and sees Dr. Megan Salon of infectious disease this week The area on the right third plantar metatarsal head still requires debridement not much change in dimensions He has a new wound on the medial tip of his right third toe again this probes to bone. He only noticed this 2 days ago 01/17/18; the patient saw Dr. [name] last week and he is back on Vanco and Fortaz at dialysis. 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 We are previously seeing him for a wound also on the base of the fourth med head on the right. X- ray of this area did not show osteomyelitis in relation to a new wound on the tip of his right third toe. Again this probes to bone. Finally his second  toe on the left which didn't really show anything on the MRI at least no report appears to have a separated cutaneous area which I think is going to come right off the  tip of his toe and leave exposed bone. Whether this is infectious or ischemic I am not clear Culture I did from the third toe last week which was his new wound was negative. Plain x-ray on the right foot did not show osteomyelitis in the toes 01/24/18; the patient is going went to see Dr. [name]. He is going to have a amputation of the left second and right third toe although paradoxically the left second toe looks better than last week. We have treating a deep probing wound on the left lateral foot and the area over the third metatarsal head on the right foot. 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 Also a debridement including bone of the left lateral foot and a closure which is still sutured. This is a bit surprising. Also apparent debridement of the base of the right third toe wound. 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 I'm not sure exactly where we are in the frame of things here. The wound on the left lateral foot is still sutured also the amputation sites of the left second and right third toe. He is been using Xeroform to the sutured areas silver alginate to the right foot 02/14/18; he continues on Rocephin at dialysis for osteomyelitis. I still don't have a good sense of where we are in the treatment duration. The wounds on the left lateral foot had the sutures removed and this clearly still probes deeply. We debrided the area with a #5 curet. We'll use silver alginate to the wound the area over the right third met head also required debridement of callus skin and subcutaneous tissue and necrotic debris over the wound surface. This tunnels superiorly but I did not unroofed this today. The areas on the tip of his toes have a crusted surface eschar I did  not debridement this either. 02/21/18; he continues on Rocephin at dialysis for osteomyelitis. I don't have a good sense of where we are and the treatment duration. The wounds on the left lateral foot is callused over on presentation but requires debridement. The area on the right third med head plantar aspect actually is measuring smaller 02/28/18;patient is on Vanco and Fortaz at dialysis, I'm not sure why I thought this was Rocephin above unless it is been changed. I don't have a good sense of time frame here. Using silver collagen to the area on the left lateral foot and right third med head plantar foot 03/07/18; patient is completing bank and Fortaz at dialysis soon. He is using Silver collagen to the area on the left lateral foot and the right third metatarsal head. He has a smaller superficial wound distal to the left lateral foot wound. 03/14/18-he is here in follow-up evaluation for multiple ulcerations to his bilateral feet. He presents with a new ulceration to the plantar aspect of the left foot underneath the blister, this was deroofed to reveal a partial thickness ulcer. He is voicing no complaints or concerns, tolerated dialysis yesterday. We will continue with same treatment plan and he will follow-up next week 03/21/18; the  patient has a area on the lateral left foot which still has a small probing area. The overall surface area of the wound is better. He presented with a new ulceration on the left foot plantar fifth metatarsal head last week. The area on the right third metatarsal head appears smaller.using silver alginate to all wound areas. The patient is changing the dressing himself. He is using a Darco forefoot offloading are on the right and a healing sandal on the left 03/28/18; original wound left lateral foot. Still nowhere close to looking like a heeling surface secondary wound on the left lateral foot at the base of his question fifth metatarsal which was new last week Right  third metatarsal head. We have been using silver alginate all wounds 04/04/18; the original wound on the left lateral foot again heavy callus surrounding thick nonviable subcutaneous tissue all requiring debridement. The new wound from 2 weeks ago just near this at the base of the fifth metatarsal head still looks about the same Unfortunately there is been deterioration in the third medical head wound on the right which now probes to bone. I must say this was a superficial wound at one point in time and I really don't have a good frame of reference year. I'll have to go back to look through records about what we know about the right foot. 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 He is also been to see Dr. [name]. Somewhere in here as somebody is ordered a VASCULAR evaluation which was done on 03/22/18. On the right is anterior tibial artery was monophasic triphasic at the posterior tibial artery. On the left anterior tibial artery monophasic posterior tibial artery monophasic. ABI in the right was 1.43 on the left 1.21. TBIs on the right at 0.41 and on the left at 0.32. A vascular consult was recommended and I think has been arranged. 04/11/18; [name] has not had an MRI of the right foot since 2017. Recent x-ray of the right foot done in January and February was negative. However he has had a major deterioration in the wound over the third met head. He is completed antibiotics last week at dialysis [place]. I think he is going to see vascular in follow-up. The areas on the left lateral foot and left plantar fifth metatarsal head both look satisfactory. I debrided both of these areas although the tissue here looks good. The area on the left lateral foot once probe to bone it certainly does not do that now 04/18/18; MRI of the right foot is on Thursday. He has what looks to be serosanguineous  purulent drainage coming out of the wound over the right third metatarsal head today. He completed antibiotics bank and Fortaz 2 weeks ago at dialysis. He has a vascular follow-up with regards to his arterial insufficiency although I don't exactly see when that is booked. The areas on the left foot including the lateral left foot and the plantar left fifth metatarsal head look about the same. 04/25/18-He is here in follow-up evaluation for bilateral foot ulcers. MRI obtained was negative for osteomyelitis. Wound culture was negative. We will continue with same treatment plan he'll follow-up next week 05/02/18; the right plantar foot wound over the third metatarsal head actually looks better than when I last saw this. His MRI was negative for osteomyelitis wound culture was negative. On the left plantar foot both wounds on the  plantar fifth metatarsal head and on the lateral foot both are covered in a very hard circumferential callus. 05/09/18; right plantar foot wound over the third metatarsal head stable from last week. Left plantar foot wound over the fifth metatarsal head also stable but with callus around both wound areas The area on the left lateral foot had thick callus over a surface and I had some thoughts about leaving this intact however it felt boggy. We've been using silver alginate all wounds 05/16/18; since the patient was last here he was hospitalized from 5/15 through 5/19. 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 This grew MRSA. He was placed on vancomycin.MRI of the left foot suggested "progressive" osteomyelitis and bone destruction of the cuboid and the base of the fifth metatarsal. Stable erosive changes of the base of the second metatarsal. I'm wondering if they're aware that he had surgery and debridement in the area of the underlying bone previously by Dr. [name]. In any  case, He was also revascularized with an angioplasty and stenting of the left tibial peroneal trunk and angioplasty of the left posterior tibial artery. He is on vancomycin at dialysis. He has a 2 week follow-up with infectious disease. He has vascular surgery follow-up. He was started on Plavix. 05/23/18; the patient's wound on the lateral left foot at the level of the fifth metatarsal head and the plantar wound on the plantar fifth metatarsal head both look better. The area on the right third metatarsal head still has depth and undermining. We've been using silver alginate. He is on vancomycin. He has been revascularized on the left 05/30/18; he continues on vancomycin at dialysis. Revascularized on the left. The area on the left lateral foot is just about closed. Unfortunately the area over the plantar fifth metatarsal head undermining superiorly as does the area over the right third metatarsal head. Both of these significantly deteriorated from last week. He has appointments next week with infectious disease and orthopedics I delayed putting on a cast on the left until those appointments which therefore we made we'll bring him in on Friday the 14th with the idea of a cast on the left foot 06/06/18; he continues on vancomycin at dialysis. Revascularized on the left. He arrives today with a ballotable swelling just above the area on the left lateral foot where his previous wound was. He has no pain but he is reasonably insensate. The difficult areas on the plantar left fifth metatarsal head looks stable whereas the area on the right third plantar metatarsal head about the same as last week there is undermining here although I did not unroofed this today. He required a considerable debridement of the swelling on the left lateral foot area and I unroofed an abscess with a copious amount of brown purulent material which I obtained for culture. I don't believe during his recent hospitalization he had any  further imaging although I need to review this. Previous cultures from this area done in this clinic showed MRSA, I would be surprised if this is not what this is currently even though he is on vancomycin. 06/13/18; he continues on vancomycin at dialysis however he finishes this on Sunday and then graduates the doxycycline previously prescribed by Dr. [name]. The abscess site on the lateral foot that I unroofed last time grew moderate amounts of methicillin-resistant staph aureus that is both vancomycin and tetracycline sensitive so  we should be okay from that regard. He arrives today in clinic with a connection between the abscess site and the area on the lateral foot. We've been using silver alginate to all his wound areas. The right third metatarsal head wound is measuring smaller. Using silver alginate on both wound areas 06/20/18; transitioning to doxycycline prescribed by Dr. [name]. The abscess site on the lateral foot that I unroofed 2 weeks ago grew MRSA. That area has largely closed down although the lateral part of his wound on the fifth metatarsal head still probes to bone. I suspect all of this was connected. On the right third metatarsal head smaller looking wound but surrounded by nonviable tissue that once again requires debridement using silver alginate on both areas 06/27/18; on doxycycline 100 twice a day. The abscess on the lateral foot has closed down. Although he still has the wound on the left fifth metatarsal head that extends towards the lateral part of the foot. The most lateral part of this wound probes to bone Right third metatarsal head still a deep probing wound with undermining. Nevertheless I elected not to debridement this this week If everything is copious static next week on the left I'm going to attempt a total contact cast 07/04/18; he is tolerating his doxycycline. The abscess on the left lateral foot is closed down although there is still a deep wound here that  probes to bone. We will use silver collagen under the total contact cast Silver collagen to the deep wound over the right third metatarsal head is well 07/11/18; he is tolerating doxycycline. The left lateral fifth plantar metatarsal head still probes deeply but I could not probe any bone. We've been using silver collagen and this will be the second week under the total contact cast Silver collagen to deep wound over the right third metatarsal head as well 07/18/18; he is still taking doxycycline. The left lateral fifth plantar metatarsal this week probes deeply with a large amount of exposed bone. Quite a deterioration. He required extensive debridement. Specimens of bone for pathology and CNS obtained Also considerable debridement on the right third metatarsal head 07/25/18; bone for pathology last week from the lateral left foot showed osteomyelitis. CandS of the bone Enterobacter. And Klebsiella. He is now on ciprofloxacin and the doxycycline is stopped [Dr. [name] of infectious disease] area He has an appointment with Dr. [name] tomorrow I'm going to leave the total contact cast off for this week. May wish to try to reapply that next week or the week after depending on the wound bed looks. I'm not sure if there is an operative option here, previously is followed with Dr. [name] 08/01/18 on evaluation today patient presents for reevaluation. He has been seen by infectious disease, Dr. [name], and he has placed the patient back on Ceftaz currently to be given to him at dialysis three times a week. 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 Fortunately there does not appear to be the evidence of infection at this point in time. Overall the patient has been tolerating the dressing changes without complication. We are no longer due lives in the cast of left foot secondary to the infection obviously. 08/10/18 on evaluation today  patient appears to be doing rather well all things considered in regard to his left plantar foot ulcer. He does have a significant callous on the lateral portion of his left foot he wonders if I  can help clean that away to some degree today. Fortunately he is having no evidence of infection at this time which is good news. No fevers chills noted. With that being said his right plantar foot actually does have a significant callous buildup around the wound opening this seems to not be doing as well as I would like at this point. I do believe that he would benefit from sharp debridement at this site. Subsequently I think a total contact cast would be helpful for the right foot as well. 08/17/18; the patient arrived today with a total contact cast on the right foot. This actually looks quite good. He had gone to see Dr. Megan Salon of infectious disease about the osteomyelitis on the left foot. I think he is on IV Fortaz at dialysis although I am not exactly sure of the rationale for the Fortaz at the time of this dictation. He also arrived in today with a swelling on the lateral left foot this is the site of his original wounds in fact when I first saw this man when he was under Dr. Ardeen Garland care I think this was the site of where the wound was located. It was not particularly tender however using a small scalpel I opened it to [place] some moderate amount of purulent drainage. 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 Last MRI was in May that showed progressive osteomyelitis and bone destruction of the cuboid and base of the fifth metatarsal. Last week he had an abscess over this same area this was removed. Culture was negative. We are continuing with a total contact cast to the area on the third metatarsal head on the right and making some good progress here. The patient asked  me again about renal transplant. He is not on the list because of the open wounds. 08/31/18; apparently the patient had an interruption in the [place] but he is now back on this at dialysis. Apparently there was a misunderstanding and Dr. Crissie Figures orders. Due to have the MRI of the left foot tonight. The area on the right foot continues to have callus thick skin and subcutaneous tissue around the wound edge that requires constant debridement however the wound is smaller we are using a total contact cast in this area. Alginate all wounds 09/07/18; without much surprise the MRI of his left foot showed osteomyelitis in the reminiscent of his fourth metatarsal but also the third metatarsal. She arrives in clinic today with the right foot and a total contact cast. There was purulent drainage coming out of the wound which I have cultured. Marked deterioration here with undermining widely around the wound orifice. Using pickups and a scalpel I remove callus and subcutaneous tissue from a substantial new opening. In a similar fashion the area on the left lateral foot that was blistered last week I have opened this and remove skin and subcutaneous tissue from this area to expose a obvious new wound. I think there is extension and communication between all of this on the left foot. The patient is on Fortaz at dialysis. Culture done of the right foot. We are clearly not making progress here. We have made him an appointment with Dr. [name] of orthopedics. I think an amputation of the left leg may be discussed. I don't think there is anything that can be done with foot salvage. 09/14/18; culture from the right  foot last time showed a very resistant MRSA. This is resistant to doxycycline. I'm going to try to get linezolid at least for a week. He does not have a appointment with Dr. [name] yet. 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 I'll send a message to  Dr. [name] of infectious disease. I want the patient to see Dr. [name] to go over the pros and cons of an amputation which I think will be a BKA. The patient had a question about whether this is curative or not. I told him that I thought it would be although spread of staph aureus infection is not unheard of. MRI of the right foot was done at the end of April 2019 did not show osteomyelitis. The patient last saw Dr. [name] on 9/3. I'll send Dr. [name] a message. I would really like him to weigh the pros and cons of a BKA on the left otherwise he'll probably need IV antibiotics and perhaps hyperbaric oxygen again. He has not had a good response to this in the past. His MRI earlier this month showed progressive damage in the remnants of the fourth and third metatarsal 09/21/2018; sees Dr. [name] of orthopedics next week to discuss a left BKA in response to the osteomyelitis in the left foot. Using silver alginate to all wound areas. He is completing the Zyvox I did put in for him after last culture showed MRSA 09/29/2018; patient saw Dr. [name] of orthopedics discussed the osteomyelitis in the left foot third and fourth metatarsals. He did not recommend urgent surgery but certainly stated the only surgical option would be a BKA. He is communicating with Dr. [name]. The problem here is the instability of the areas on his foot which constantly generate draining abscesses. I will communicate with Dr. [name] about this. He has an appointment on 10/23 10/05/2018; patient sees Dr. [name] on 10/23. I have sent him a secure message not ordered any additional antibiotics for now. Patient continues to have 2 open areas on the left plantar foot at the fifth metatarsal head and the lateral aspect of the foot. These have not changed all that much. The area on the right third met head still has thick callus and surrounding subcutaneous tissue we have been using silver alginate 10/12/2018; patient sees Dr.  [name] or colleague on 10/23. 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 The area on the right plantar foot is just about closed. The superior wound on the left fifth metatarsal head is callused over but I am not sure if this is closed. The area below it is about the same. Considerable amount of callus on the lateral foot. We have been using silver alginate to the wounds 10/19/2018; the patient saw Dr. [name] on 10/22. He wishes to try and continue to save the left foot. He has been given 6 weeks of oral ciprofloxacin. We continue to put a total contact cast on the right foot. The area over the fifth metatarsal head on the left is callused over/perhaps healed but I did not remove the callus to find out. He still has the wound on the left lateral midfoot requiring debridement. We have been using silver alginate to all wound areas 10/26/2018 On the left foot our intake nurse noted some purulent drainage from the inferior wound [currently the only one that is not callused over] On the right foot  even though he is in total contact cast a considerable amount of thick black callus and surface eschar. On this side we have been using silver alginate under a total contact cast. he remains on ciprofloxacin as prescribed by Dr. [name] 11/02/2018; culture from last week which is done of the probing area on the left midfoot wound showed MRSA "few". I am going to need to contact Dr. [name] which I will do today I think he is going to need IV antibiotics again. The area on the left foot which was callused on the side is clearly separating today all of this was removed a copious amounts of callus and necrotic subcutaneous tissue. The area on the right plantar foot actually remained healthy looking with a healthy granulated base. We are using silver alginate to all wound areas 11/09/2018; unfortunately neither 1 of the patient's wounds areas  looks at all satisfactory. On the left he has considerable necrotic debris from the plantar wound laterally over the foot. I removed copious amounts of material including callus skin and subcutaneous tissue. On the right foot he arrives with undermining and frankly purulent drainage. Specimen obtained for culture and debridement of the callused skin and subcutaneous tissue from around the circumference. Because of this I cannot put him back in a total contact cast but to be truthful we are really unfortunately not making a lot of progress I did put in a secure text message to Dr. [name] wondering about the MRSA on the left foot. He suggested vancomycin although this is not started. I was left wondering if he expected me to send this into dialysis. Has we have more purulence on the right side wait for that result before calling dialysis 11/16/2018; I called dialysis earlier this week to get the vancomycin started. I think he got the first dose on Tuesday. The patient tells me that he fell earlier this week twisting his left foot and ankle and he is swelling. He continues to not look well. He had blood cultures done at dialysis on Tuesday he is not been informed of the results. 12/07/2018; the patient was admitted to hospital from 11/22/2018 through 12/02/2018. He underwent a left BKA. Apparently had staph sepsis. In the meantime being off the right foot this is closed over. He follows up with Dr. 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 Subsequent to this he actually had multiple falls in fact he tells me to following which calls the wound to be his apparently. He has been seeing Dr. [name] and his physician assistant in the interim. They actually did want him to come back for reevaluation to see if there's anything we can do for me wound care perspective the help this area to heal  more appropriately. The patient states that he does not have a tremendous amount of pain which is good news. No fevers, chills, nausea, or vomiting noted at this time. We have gotten approval from Dr. [name] office had [place] for [place] to treat the patient for his stop wound. This is due to the fact that the patient was in the 90 day postop global. 1/21; the patient does not have an open wound on the right foot although he does have some pressure areas that will need to be padded when he is transferring. The dehisced surgical wound looks clean although there is some undermining of 1.5 cm superiorly. We have been using  calcium alginate 1/28; patient arrives in our clinic for review of the left BKA stump wound. This appears healthy but there is undermining. TheraSkin #1 2/11; TheraSkin #2 2/25; TheraSkin #3. Wound is measuring smaller 3/10; TheraSkin #4 wound is measuring smaller 3/24; TheraSkin #5 wound is measuring smaller and looks healthy. 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 He has no open area on the right foot although he does have some callused area over the original third metatarsal head wound. The third metatarsal is also subluxed on the right foot. I have warned him today that he cannot consider wearing a prosthesis for at least a month but he can go for measurements. He is going to need to keep the right foot padded is much as possible in his diabetic shoe indefinitely READMISSION 06/12/2019 [name] is a type II diabetic on dialysis. He has been in this clinic multiple times with wounds on his bilateral lower extremities. 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 He also has a history of wounds on his right foot but tells me that everything is going well here. He actually came in here with his prosthesis walking with a cane. We were  all really quite gratified to see this He states over the last several weeks he has had a painful area at the tip of the left third finger. His dialysis shunt in his is in the left upper arm and this particularly hurts during dialysis when his fingers get numb and there is a lot of pain in the tip of his left third finger. I believe he is seen vascular surgery. He had a Doppler done to evaluate for a dialysis steal syndrome. He is going for a banding procedure 1 week from today towards the left AV fistula. I think if that is unsuccessful they will be looking at creating a new shunt. He had a course of doxycycline when he which he finished about a week ago. He has been using Bactroban to the finger 6/25; the patient had his banding procedure and things seem to be going better. He is not having pain in his hand at dialysis. The area on the tip of his finger seems about closed. He did however have a paronychia I the last time I saw him. I have been advising him to use topical Bactroban washing the finger. Culture I did last time showed a few staph epidermidis which I was willing to dismiss as superficial skin contaminant. He arrives today with the finger wound looking better however the paronychia a seems worse 7/9; 2-week follow-up. He does not have an open wound on the tip of his third left finger. I thought he had an initial ischemic wound on the tip of the finger as well as a paronychia a medially. All of this appears to be closed. 8/6-Patient returns after 3 weeks for follow-up and has a right second met head plantar callus with a significant area of maceration hiding an ulcer ABI repeated today on right - 1.26 8/13-Patient returns at 1 week, the right second met head plantar wound appears to be slightly better, it is surrounded by the callus area we are using silver alginate 8/20; the patient came back to clinic 2 weeks ago with a wound on the right second metatarsal head. He apparently told  me he developed callus in  this area but also went away from his custom shoes to a pair of running shoes. Using silver alginate. 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 There might be much we can do to offload this although the patient tells me he has a wheelchair that he is using to try and stay off the wound is much as possible 8/27; right second metatarsal head. Still small punched out area with thick callus and subcutaneous tissue around the wound. We have been using silver collagen. This is always been an issue with this man's wound especially on this foot 9/3; right second met head. Still the same punched-out area with thick callus and subcutaneous tissue around the wound removing the circumference demonstrates repetitively undermining area. I have removed all the subcutaneous tissue associated with this. We have been using silver collagen 9/15; right second metatarsal head. Same punched-out thick callus and subcutaneous tissue around the wound area. Still requiring debridement we have been using silver collagen I changed back to silver alginate X-ray last time showed no evidence of osteomyelitis. Culture showed Klebsiella and he was started and Keflex apparently just 4 days ago 9/24; right second metatarsal head. He is completed the antibiotics I gave him for 10 days. Same punched-out thick callus and subcutaneous tissue around the area. Still requiring debridement. We have been using silver alginate. The area itself looks swollen and this could be just [place] that comes on this area with walking on a prosthesis on the left however I think he needs an MRI and I am going to order that today. There is not an option to offload this further 10/1; right second plantar metatarsal head. MRI booked for October 6. Generally looking better today using silver alginate 10/8; right second plantar metatarsal head. MRI that was done on 10/6 was negative for osteomyelitis noted  prior amputations of the second and fourth toes at the MTP. Prior amputation of the third toe to the base of the middle phalanx. Prior amputation of the fifth toe to the head of the metatarsal. 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 He does not recall about hearing about this. He did not have any pain but then again he has reduced sensation from diabetic neuropathy 10/15; right second plantar metatarsal head. Still in the same condition callus nonviable subcutaneous tissue which cleans up nicely with debridement but reforms by the next week. The patient tells me that he is offloading this is much as he can including taking his wheelchair to dialysis. We have been using silver alginate, changed to silver collagen today 10/22; right second plantar metatarsal head. Wound measures smaller but still thick callus around this wound. Still requiring debridement 11/5 right second plantar metatarsal head. Less callus around the wound circumference but still requiring debridement. There is still some undermining which I cleaned out the wound looks healthy but still considerable punched out depth with a relatively small circumference to the wound there is no palpable bone no purulent drainage 11/12; right second plantar metatarsal head. Small wound with thick callus tissue around the wound and significant undermining. We have been using silver collagen after my continuous debridements of this area 11/19; patient arrives in today with purulent drainage coming out of the wound in the second third met head area on the right foot. Serosanguineous thick drainage. Specimen obtained for culture. 11/21/2019 on evaluation today patient appears to be doing well with regard to his foot ulcer.  The good news is is culture came back negative for any bacteria there was no growth. The other good news is his x-ray also appears to be doing well. The foot is also measuring  much better than what it was. Overall I am very pleased with how things seem to be progressing. 12/22; patient was in [facility] for several weeks due to the death of a family member. He said he stayed off the wound using silver alginate. 01/03/2020. Patient has a small open area with roughly 0.3 cm of circumferential undermining. The orifice looks smaller. We have been using silver collagen. There is not a wound more aggressive way to offload this area as he has a prosthesis on the other leg Electronic Signature(s) Signed: 01/04/2020 4:55:01 AM By: Linton Ham MD Entered By: Linton Ham on 01/03/2020 14:46:38 -------------------------------------------------------------------------------- Physical Exam Details Patient Name: Date of Service: [name], [name] 01/03/2020 1:00 PM Medical Record [id number] Patient Account Number: [id number] Date of Birth/Sex: Treating RN: 1951-06-22 (69 y.o. M) Primary Care Provider: [name] Other Clinician: Referring Provider: Treating Provider/Extender:Vianey Caniglia, Jeronimo Greaves, Buena Irish in Treatment: 29 Constitutional Sitting or standing Blood Pressure is within target range for patient.. Pulse regular and within target range for patient.Marland Kitchen Respirations regular, non-labored and within target range.. Temperature is normal and within the target range for the patient.Marland Kitchen Appears in no distress. Notes Wound exam; large area of thick nonviable skin over the wound however I did not debride this. I am going to see if this will close over on its own although further debridement may be necessary. There is no evidence of surrounding infection. Electronic Signature(s) Signed: 01/04/2020 4:55:01 AM By: Linton Ham MD Entered By: Linton Ham on 01/03/2020 14:47:49 -------------------------------------------------------------------------------- Physician Orders Details Patient Name: Date of Service: [name], [name] 01/03/2020 1:00  PM Medical Record [id number] Patient Account Number: [id number] Date of Birth/Sex: Treating RN: 01/13/1951 (69 y.o. 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 Dressing Change Frequency Wound #41 Right Metatarsal head second Change Dressing every other day. Skin Barriers/Peri-Wound Care Barrier cream - as needed for maceration, wetness, or redness. Wound Cleansing May shower and wash wound with soap and water. Primary Wound Dressing Wound #41 Right Metatarsal head second Silver Collagen - moisten with normal saline or KY jelly Secondary Dressing Wound #41 Right Metatarsal head second Kerlix/Rolled Gauze Dry Gauze Foam Border - patient may use bordered foam to cut into a felt to offload. Other: - felt to periwound and place one below wound. lightly wrap coban to hold dressing in place. felt / pad wound Edema Control Avoid standing for long periods of time Elevate legs to the level of the heart or above for 30 minutes daily and/or when sitting, a frequency of: - throughout the day. Off-Loading Other: - felt to periwound and place one below wound. Additional Orders / Instructions Other: - minimize walking and standing on foot to aid in wound healing and callus buildup. Electronic Signature(s) Signed: 01/03/2020 5:19:45 PM By: Deon Pilling Signed: 01/04/2020 4:55:01 AM By: Linton Ham MD Entered By: Deon Pilling on 01/03/2020  14:12:56 -------------------------------------------------------------------------------- Problem List  Details Patient Name: Date of Service: WAYDE, GOPAUL 01/03/2020 1:00 PM Medical Record [id number] Patient Account Number: [id number] Date of Birth/Sex: Treating RN: 04/28/1951 (69 y.o. 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, 06/12/2019 No Yes other extremity L03.115 Cellulitis of right lower limb 11/15/2019 No Yes Inactive Problems ICD-10 Code Description Active Date Inactive Date L03.114 Cellulitis of left upper limb 06/12/2019 06/12/2019 L98.498 Non-pressure chronic ulcer of skin of other sites with other 06/12/2019 06/12/2019 specified severity S61.209D Unspecified open wound of unspecified finger without damage 06/12/2019 06/12/2019 to nail, subsequent encounter Resolved Problems Electronic Signature(s) Signed: 01/04/2020 4:55:01 AM By: Linton Ham MD Entered By: Linton Ham on 01/03/2020 14:45:17 -------------------------------------------------------------------------------- Progress Note Details Patient Name: Date of Service: [name] 01/03/2020 1:00 PM Medical Record [id number] Patient Account Number: [id number] Date of Birth/Sex: Treating RN: [birth year] (69 y.o. M) Primary Care Provider: [name] Other Clinician: Referring Provider: Treating Provider/Extender:Jp Eastham, Jeronimo Greaves, Buena Irish in Treatment: 29 Subjective History of Present Illness (HPI) The following  HPI elements were documented for the patient's wound: Location: Patient presents with a wound to bilateral feet. Quality: Patient reports experiencing essentially no pain. Severity: Mildly severe wound with no evidence of infection Duration: Patient has had the wound for greater than 2 weeks prior to presenting for treatment The patient is a pleasant 69 yrs old bm here for evaluation of ulcers on the plantar aspect of both feet. 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 He has a left arm graft for access. He has been trying to stay off his feet for weeks but does not seem to have any improvement in the wound. He has been seeing someone at the foot center and was referred to the Wound Care center for further evaluation. 12/30/15 the patient has 3 wounds one over the right first metatarsal head and 2 on the left foot at the left fifth and lleft first metatarsal head. All of these look relatively similar. The one over the left fifth probe to bone I could not prove that any of the others did. I note his MRI in November that did not show osteomyelitis. His peripheral pulses seem robust. All of these underwent surgical debridement to remove callus nonviable skin and subcutaneous tissue 01/06/16; the patient had his sutures removed from the right fifth ray amputation. There may be a small open part of this superiorly but otherwise the incision looks good. Areas over his right first, left first and left fifth metatarsal head all underwent surgical debridement as a varying degree of callus, skin and nonviable subcutaneous tissue. The area that is most worrisome is the right fifth metatarsal head which has a wound probes precariously close to bone. There is no purulent drainage or erythema 01/13/16; I'm not exactly sure of the status of the right fifth ray amputation site however he follows with Dr. [name] later this week. The area over his right first  plantar metatarsal head, left first and fifth plantar metatarsal head are all in the same status. Thick circumferential callus, nonviable subcutaneous tissue. Culture of the left fifth did not culture last  week 01/20/16; all of the patient's wounds appear and roughly the same state although his amputation site on the right lateral foot looks better. His wounds over the right first, left first and left fifth metatarsal heads all underwent difficult surgical debridement removing circumferential callus nonviable skin and subcutaneous tissue. There is no overt evidence of infection in these areas. MRI at the end of December of the left foot did not show osteomyelitis, right foot showed osteomyelitis of the right fifth digit he is now status post amputation. 01/27/16 the patient's wounds over his plantar first and fifth metatarsal heads on the left all appear better having been started on a total contact cast last week. They were debridement of circumferential callus and nonviable subcutaneous tissue as was the wound over the first metatarsal head. His surgical incision on the right also had some light surface debridement done. 03/23/2016 -- the patient was doing really well and most of his wounds had almost completely healed but now he came back today with a history of having a discharge from the area of his right foot on the plantar aspect and also between his first and second toe. He also has had some discharge from the left foot. Addendum: I spoke to the PA [name] at the dialysis center whose fax number is [phone]. We discussed the infection the patient has and she will put the patient on vancomycin and Fortaz until the final culture report is back. We will fax this as soon as available. 03/30/2016 -- left foot x-ray IMPRESSION:No definitive osteomyelitis noted. X-ray of the right foot -- IMPRESSION: 1. Soft tissue swelling. Prior amputation right fifth digit. No acute or focal bony  abnormality identified. If osteomyelitis remains a clinical concern, MRI can be obtained. 2. Peripheral vascular disease. His culture reports have grown an MSSA -- and we will fax this report to his hemodialysis center. He was called in a prescription of oral doxycycline but I have told her not to fill this in as he is already on IV antibiotics. 04/06/2016 -- a few days ago, I spoke to the hemodialysis center nurse who had stopped the IV antibiotics and he was given a prescription for doxycycline 100 mg by mouth twice a day for a week and he is on this at the present time. 05/11/2016 -- he has recently seen his PCP this week and his hemoglobin A1c was 7. He is working on his paperwork to get his orthotic shoes. 05/25/2016 -- -- x-ray of the left foot IMPRESSION:No acute bony abnormality. No radiographic changes of acute osteomyelitis. No change since prior study. X-ray of the right foot -- IMPRESSION: Postsurgical changes are seen involving the fifth toe. No evidence of acute osteomyelitis. he developed a large blister on the medial part of his right foot and this opened out and drain fluid. 06/01/2016 -- he has his MRI to be done this afternoon. 06/15/2016 -- MRI of the left forefoot without contrast shows 2 separate regions of cutaneous and subcutaneous edema and possible ulceration and blistering along the ball of the foot. No obvious osteomyelitis identified. MRI of the right foot showed cutaneous and subcutaneous thickening plantar to the first digit sesamoid with an ulcer crater but no underlying osteomyelitis is identified. 06/22/2016 -- the right foot plantar ulcer has been draining a lot of seropurulent material for the last few days. 06/30/2016 -- spoke to the dialysis center and I believe I spoke to Hale a PA at the center who discussed with me and agreed  to putting Ashanti on vancomycin until his cultures arrive. On review of his culture report no WBCs were seen or no organisms  were seen and the culture was reincubated for better growth. The final report is back and there were no predominant growth including Streptococcus or Staphylococcus. Clinically though he has a lot of drainage from both wounds a lot of undermining and there is further blebs on the left foot towards the interspace between his first and second toe. 08/10/2016 -- the culture from the right foot showed normal skin flora and there was no Staphylococcus aureus was group A streptococcus isolated. 09/21/2016 -- -- MRI of the right foot was done on 09/13/2016 - IMPRESSION: Findings most consistent with acute osteomyelitis throughout the great toe, sesamoid bones and plantar aspect of the head of the first metatarsal. Fluid in the sheath of the flexor tendon of the great toe could be sympathetic but is worrisome for septic tenosynovitis. First MTP joint effusion worrisome for septic joint. He was admitted to the hospital on 09/11/2016 and treated for a fever with vancomycin and cefepime. He was seen by Dr. Bobby Rumpf of infectious disease who recommended 6 weeks treatment with vancomycin and ceftazidime with his hemodialysis and to continue to see as in the wound clinic. Vascular consult was pending. The patient was discharged home on 09/14/2016 and he would continue with IV antibiotics for 6 weeks. 09/28/16 wound appears reasonably healthy. Continuing with total contact cast 10/05/16 wound is smaller and looking healthy. Continue with total contact cast. He continues on IV antibiotics 10/12/2016 -- he has developed a new wound on the dorsal aspect of his left big toe and this is a superficial injury with no surrounding cellulitis. He has completed 30 days of IV antibiotics and is now ready to start his hyperbaric oxygen therapy as per his insurance company's recommendation. 10/19/2016 -- after the cast was removed on the right side he has got good resolution of his ulceration on the right plantar  foot. he has a new wound on the left plantar foot in the region of his fourth metatarsal and this will need sharp debridement. 10/26/2016 -- Xray of the right foot complete: IMPRESSION: Changes consistent with osteomyelitis involving the head of the first metatarsal and base of the first proximal phalanx. The sesamoid bones are also likely involved given their positioning. 11/02/2016 -- there still awaiting insurance clearance for his hyperbaric oxygen therapy and hopefully he will begin treatment soon. 11/09/2016 -- started with hyperbaric oxygen therapy and had some barotrauma to the right ear and this was seen by ENT who was prescribed Afrin drops and would probably continue with HBO and he is scheduled for myringotomy tubes on Friday 11/16/2016 -- he had his myringotomy tubes placed on Friday and has been doing much better after that with some fluid draining out after hyperbaric treatment today. The pain was much better. 11/30/2016 -- over the last 2 days he noticed a swelling and change of color of his right second toe and this had been draining minimal fluid. 12/07/2016 -- x-ray of the right foot -- IMPRESSION:1. Progressive ulceration at the distal aspect of the second digit with significant soft tissue swelling and osseous changes in the distal phalanx compatible with osteomyelitis. 2. Chronic osteomyelitis at the first MTP joint. 3. Ulcerations at the second and third toes as well without definite osseous changes. Osteomyelitis is not excluded. 4. Fifth digit amputation. On 12/01/2016 oo I spoke to Dr. Bobby Rumpf, the infectious disease specialist, who kindly  agreed to treat this with IV antibiotics and he would call in the order to the dialysis center and this has been discussed in detail with the patient who will make the appropriate arrangements. The patient will also book an appointment as soon as possible to see Dr. [name] in the office. Of note the patient has not been on  antibiotics this entire week as the dialysis center did not receive any orders from Dr. [name]. I got in touch with Dr. [name] who tells me the patient has an appointment to see him this coming Wednesday. 12/14/2016 -- the patient is on ceftazidime and vancomycin during his dialysis, and I understand this was put on by his nephrologist Dr. Raliegh Ip possibly after speaking with infectious disease Dr. [name] 12/23/16; patient was on my schedule today for a wound evaluation as he had difficulties with his schedule earlier this week. I note that he is on vancomycin and ceftazidine at dialysis. In spite of this he arrives today with a new wound on the base of the right second toe this easily probes to bone. The known wound at the tip of the second toe With this area. He also has a superficial area on the medial aspect of the third toe on the side of the DIP. This does not appear to have much depth. The area on the plantar left foot is a deep area but did not probe the bone 12/28/16 -- he has brought in some lab work and the most recent labs done showed a hemoglobin of 12.1 hematocrit of 36.3, neutrophils of 51% WBC count of 5.6, BN of 53, albumin of 4.1 globulin of 3.7, vancomycin 13 g per mL. 01/04/2017 -- he saw Dr. [name] who has recommended a amputation of the right second toe and he is awaiting this date. He sees Dr. Bobby Rumpf of infectious disease tomorrow. The bone culture taken on 12/28/2016 had no growth in 2 days. 01/11/2017 -- was seen by Dr. Bobby Rumpf regarding the management and has recommended a eval by vascular surgeons. He recommended to continue the antibiotics during his hemodialysis. Xray of the left foot -- IMPRESSION: No acute fracture, dislocation, or osseous erosion identified. 01/18/2017 -- he has his vascular workup later today and he is going to have his right foot second toe amputation this coming Friday by Dr. [name]. We have also put in for a another 30 treatments  with hyperbaric oxygen therapy 01/25/2017 -- I have reviewed Dr. Donnetta Hutching is vascular report from last week where he reviewed him and thought his vascular function was good enough to heal his amputation site and no further tests were recommended. He also had his orthopedic related to surgery which is still pending the notes and we will review these next week. 02/01/2017 -- the operative remote of Dr. 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 02/08/2017 --he was seen by Dr. [name] on 02/03/2017 -- patient is on [place]. after a thorough review he had recommended to continue with antibiotics during hemodialysis. The patient was seen by Dr. [name], but I do not find any follow-up note on the electronic medical record. he has removed the dressing over the right foot to  remove the sutures and asked him to see him back in a month's time 02/22/2017 -- patient has been febrile and has been having symptoms of the upper respiratory tract infection but has not been checked for the flu and it's been over 4 days now. He says he is feeling better today. Some drainage between his left first and second toe and this needed to be looked at 03/08/2017 -- was seen by Dr. Bobby Rumpf on 03/07/2017 after review he will stop his antibiotics and see him back in a month to see if he is feeling well. 03/15/2017 -- he has completed his course of hyperbaric oxygen therapy and is doing fine with his health otherwise. 03/22/2017 -- his nutritionist at the dialysis center has recommended a protein supplement to help build his collagen and we will prescribe this  for him when he has the details 05/03/2017 -- he recently noticed the area on the plantar aspect of his fifth metatarsal head which opened out and had minimal drainage. 05/10/2017 -- -- x-ray of the left foot -- IMPRESSION:1. No convincing conventional radiographic evidence of active osteomyelitis.2. Active soft tissue ulceration at the tip of the fifth digit, and at the lateral aspect of the foot adjacent the base of the fifth metatarsal. 3. Surgical changes of prior fourth toe amputation. 4. 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 5. Small vessel atherosclerotic vascular calcifications. 6. 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 ======= Readmission after 5 weeks: 06/15/2017 -- the patient returns after 5 weeks having had an MRI done on 05/17/2017 MRI of the left foot without contrast showed soft tissue ulcer overlying the base of the fifth metatarsal. 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 No drainable fluid collection to suggest an abscess. He was in hospital between 05/16/2017 and 05/25/2017 -- and on discharge was asked to follow-up with Dr. [name] and Dr. [name]. He was treated 2 weeks post discharge with vancomycin plus ceftriaxone with hemodialysis and oral metronidazole 500 mg 3 times a day. 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 last hemoglobin A1c was 6.7% He had a postoperative MR of the foot on 05/23/2017 -- which showed no new areas of cortical bone loss, edema or soft tissue ulceration to suggest osteomyelitis. the patient has completed his course of IV antibiotics during dialysis and is to see the infectious disease doctor tomorrow. Dr. [name] had seen him after suture removal and asked him to keep the wound with a dry dressing 06/21/2017 -- he was seen by Dr.  Bobby Rumpf of infectious disease on 06/16/2017 -- he stopped his Flagyl and will see him back in 6 weeks. He was asked to follow-up with Dr. [name] and with the wound clinic clinic. 07/05/17; bone biopsy from last time showed acute osteomyelitis. This is from the reminiscent in the left fifth metatarsal. Culture result is apparently still pending [holding for anaerobe}. From my understanding in this case this is been a progressive necrotic wound which is deteriorated markedly over the last 3 weeks since he returned here. He now has a large area of exposed bone which was biopsied and cultured last week. Dr. Graylon Good has put him on vancomycin and Fortaz during his hemodialysis and Flagyl orally. He is to see Dr. [name] next week 07/19/2017 -- the patient was reviewed by Dr. [name] of orthopedics who  reviewed the case in detail and agreed with the plan to continue with IV antibiotics, aggressive wound care and hyperbaric oxygen therapy. He would see him back in 3 weeks' time 08/09/2017 -- saw Dr. Bobby Rumpf on 08/03/2017 -he was restarted on his antibiotics for 6 weeks which included vanco, ceftaz and Flagyl. he recommended continuing his antibiotics for 6 weeks and reevaluate his completion date. He would continue with wound care and hyperbaric oxygen therapy. 08/16/2017 -- I understand he will be completing his 6 weeks of antibiotics sometime later this week. 09/19/2017 -- he has been without his wound VAC for the last week due to lack of supplies. He has been packing his wound with silver alginate. 10/04/2017 -- he has an appointment with Dr. [name] tomorrow and hence we will not apply the wound VAC after his dressing changes today. 10/11/2017 -- he was seen by Dr. Bobby Rumpf on 10/05/2017, noted that the patient was currently off antibiotics, and after thorough review he recommended he follow-up with the wound care as per plan and no further antibiotics at this point. 11/29/17;  patient is arrived for the wound on his left lateral foot.Marland Kitchen He is completed IV antibiotics and 2 rounds of hyperbaric oxygen for treatment of underlying osteomyelitis. He arrives today with a surface on most of the wound area however our intake nurse noted drainage from the superior aspect. This was brought to my attention. 12/13/2017 -- the right plantar foot had a large bleb and once the bleb was opened out a large callus and subcutaneous debris was removed and he has a plantar ulcer near the fourth metatarsal head 12/28/17 on evaluation today patient appears to be doing acutely worse in regard to his left foot. The wound which has been appearing to do better as now open up more deeply there is bone palpable at the base of the wound unfortunately. He tells me that this feels "like it did when he had osteomyelitis previously" he also noted that his second toe on the left foot appears to be doing worse and is swollen there does appear to be some fluid collected underneath. His right foot plantar ulcer appears to be doing somewhat better at this point and there really is no complication at the site currently. No fevers, chills, nausea, or vomiting noted at this time. Patient states that he normally has no pain at this site however. Today he is having significant pain. 01/03/18; culture done last week showed methicillin sensitive staph aureus and group B strep. He was on Septra however he arrives today with a fever of 101. He has a functional dialysis shunt in his left arm but has had no pain here. No cough. He still makes some urine no dysuria he did have abdominal pain nausea and diarrhea over the weekend but is not had any diarrhea since yesterday. Otherwise he has no specific complaints 01/05/18; the patient returns today in follow-up for his presentation from 1/8. At that point he was febrile. I gave him some Levaquin adjusted for his dialysis status. He tells me the fever broke that night and it is  not likely that this was actually a wound infection. He is gone on to have an MRI of the left foot; this showed interval development of an abnormal signal from the base of the third and fourth metatarsal and in the cuboid reminiscent consistent with osteomyelitis. There is no mention of the left second toe I think which was a concern when it was ordered. The patient  is taking his Levaquin 01/10/18; the patient has completed his antibiotics today. The area on the left lateral foot is smaller but it still probes easily to bone. He has underlying osteomyelitis here and sees Dr. Megan Salon of infectious disease this week ooThe area on the right third plantar metatarsal head still requires debridement not much change in dimensions [facility] has a new wound on the medial tip of his right third toe again this probes to bone. He only noticed this 2 days ago 01/17/18; the patient saw Dr. [name] last week and he is back on Vanco and Fortaz at dialysis. 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 We are previously seeing him for a wound also on the base of the fourth med head on the right. X- ray of this area did not show osteomyelitis in relation to a new wound on the tip of his right third toe. Again this probes to bone. Finally his second toe on the left which didn't really show anything on the MRI at least no report appears to have a separated cutaneous area which I think is going to come right off the tip of his toe and leave exposed bone. Whether this is infectious or ischemic I am not clear Culture I did from the third toe last week which was his new wound was negative. Plain x-ray on the right foot did not show osteomyelitis in the toes 01/24/18; the patient is going went to see Dr. [name]. He is going to have a amputation of the left second and right third toe although paradoxically the left  second toe looks better than last week. We have treating a deep probing wound on the left lateral foot and the area over the third metatarsal head on the right foot. 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 Also a debridement including bone of the left lateral foot and a closure which is still sutured. This is a bit surprising. Also apparent debridement of the base of the right third toe wound. 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 I'm not sure exactly where we are in the frame of things here. The wound on the left lateral foot is still sutured also the amputation sites of the left second and right third toe. He is been using Xeroform to the sutured areas silver alginate to the right foot 02/14/18; he continues on Rocephin at dialysis for osteomyelitis. I still don't have a good sense of where we are in the treatment duration. The wounds on the left lateral foot had the sutures removed and this clearly still probes deeply. We debrided the area with a #5 curet. We'll use silver alginate to the wound oothe area over the right third met head also required debridement of callus skin and subcutaneous tissue and necrotic debris over the wound surface. This tunnels superiorly but I did not unroofed this today. ooThe areas on the tip of his toes have a crusted surface eschar I did not debridement this either. 02/21/18; he continues on Rocephin at dialysis for osteomyelitis. I don't have a good sense of where we are and the treatment duration. The wounds on the  left lateral foot is callused over on presentation but requires debridement. The area on the right third med head plantar aspect actually is measuring smaller 02/28/18;patient is on Vanco and Fortaz at dialysis, I'm  not sure why I thought this was Rocephin above unless it is been changed. I don't have a good sense of time frame here. Using silver collagen to the area on the left lateral foot and right third med head plantar foot 03/07/18; patient is completing bank and Fortaz at dialysis soon. He is using Silver collagen to the area on the left lateral foot and the right third metatarsal head. He has a smaller superficial wound distal to the left lateral foot wound. 03/14/18-he is here in follow-up evaluation for multiple ulcerations to his bilateral feet. He presents with a new ulceration to the plantar aspect of the left foot underneath the blister, this was deroofed to reveal a partial thickness ulcer. He is voicing no complaints or concerns, tolerated dialysis yesterday. We will continue with same treatment plan and he will follow-up next week 03/21/18; the patient has a area on the lateral left foot which still has a small probing area. The overall surface area of the wound is better. He presented with a new ulceration on the left foot plantar fifth metatarsal head last week. The area on the right third metatarsal head appears smaller.using silver alginate to all wound areas. The patient is changing the dressing himself. He is using a Darco forefoot offloading are on the right and a healing sandal on the left 03/28/18; original wound left lateral foot. Still nowhere close to looking like a heeling surface oosecondary wound on the left lateral foot at the base of his question fifth metatarsal which was new last week ooRight third metatarsal head. ooWe have been using silver alginate all wounds 04/04/18; the original wound on the left lateral foot again heavy callus surrounding thick nonviable subcutaneous tissue all requiring debridement. The new wound from 2 weeks ago just near this at the base of the fifth metatarsal head still looks about the same ooUnfortunately there is been deterioration in the  third medical head wound on the right which now probes to bone. I must say this was a superficial wound at one point in time and I really don't have a good frame of reference year. I'll have to go back to look through records about what we know about the right foot. 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 He is also been to see Dr. [name]. Somewhere in here as somebody is ordered a VASCULAR evaluation which was done on 03/22/18. On the right is anterior tibial artery was monophasic triphasic at the posterior tibial artery. On the left anterior tibial artery monophasic posterior tibial artery monophasic. ABI in the right was 1.43 on the left 1.21. TBIs on the right at 0.41 and on the left at 0.32. A vascular consult was recommended and I think has been arranged. 04/11/18; [name] has not had an MRI of the right foot since 2017. Recent x-ray of the right foot done in January and February was negative. However he has had a major deterioration in the wound over the third met head. He is completed antibiotics last week at dialysis [place]. I think he is going to see vascular in follow-up. The areas on the left lateral foot and left plantar fifth metatarsal head both look satisfactory. I  debrided both of these areas although the tissue here looks good. The area on the left lateral foot once probe to bone it certainly does not do that now 04/18/18; MRI of the right foot is on Thursday. He has what looks to be serosanguineous purulent drainage coming out of the wound over the right third metatarsal head today. He completed antibiotics bank and Fortaz 2 weeks ago at dialysis. He has a vascular follow-up with regards to his arterial insufficiency although I don't exactly see when that is booked. The areas on the left foot including the lateral left foot and the plantar left fifth metatarsal head  look about the same. 04/25/18-He is here in follow-up evaluation for bilateral foot ulcers. MRI obtained was negative for osteomyelitis. Wound culture was negative. We will continue with same treatment plan he'll follow-up next week 05/02/18; the right plantar foot wound over the third metatarsal head actually looks better than when I last saw this. His MRI was negative for osteomyelitis wound culture was negative. ooOn the left plantar foot both wounds on the plantar fifth metatarsal head and on the lateral foot both are covered in a very hard circumferential callus. 05/09/18; right plantar foot wound over the third metatarsal head stable from last week. ooLeft plantar foot wound over the fifth metatarsal head also stable but with callus around both wound areas ooThe area on the left lateral foot had thick callus over a surface and I had some thoughts about leaving this intact however it felt boggy. ooWe've been using silver alginate all wounds 05/16/18; since the patient was last here he was hospitalized from 5/15 through 5/19. 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 This grew MRSA. He was placed on vancomycin.MRI of the left foot suggested "progressive" osteomyelitis and bone destruction of the cuboid and the base of the fifth metatarsal. Stable erosive changes of the base of the second metatarsal. I'm wondering if they're aware that he had surgery and debridement in the area of the underlying bone previously by Dr. [name]. In any case, He was also revascularized with an angioplasty and stenting of the left tibial peroneal trunk and angioplasty of the left posterior tibial artery. He is on vancomycin at dialysis. He has a 2 week follow-up with infectious disease. He has vascular surgery follow-up. He was started on Plavix. 05/23/18; the patient's wound on the lateral left foot at the  level of the fifth metatarsal head and the plantar wound on the plantar fifth metatarsal head both look better. The area on the right third metatarsal head still has depth and undermining. We've been using silver alginate. He is on vancomycin. He has been revascularized on the left 05/30/18; he continues on vancomycin at dialysis. Revascularized on the left. The area on the left lateral foot is just about closed. Unfortunately the area over the plantar fifth metatarsal head undermining superiorly as does the area over the right third metatarsal head. Both of these significantly deteriorated from last week. He has appointments next week with infectious disease and orthopedics I delayed putting on a cast on the left until those appointments which therefore we made we'll bring him in on Friday the 14th with the idea of a cast on the left foot 06/06/18; he continues on vancomycin at dialysis. Revascularized on the left. He arrives today with a ballotable swelling just above the area on the left lateral foot  where his previous wound was. He has no pain but he is reasonably insensate. The difficult areas on the plantar left fifth metatarsal head looks stable whereas the area on the right third plantar metatarsal head about the same as last week there is undermining here although I did not unroofed this today. He required a considerable debridement of the swelling on the left lateral foot area and I unroofed an abscess with a copious amount of brown purulent material which I obtained for culture. I don't believe during his recent hospitalization he had any further imaging although I need to review this. Previous cultures from this area done in this clinic showed MRSA, I would be surprised if this is not what this is currently even though he is on vancomycin. 06/13/18; he continues on vancomycin at dialysis however he finishes this on Sunday and then graduates the doxycycline previously prescribed by Dr.  [name]. The abscess site on the lateral foot that I unroofed last time grew moderate amounts of methicillin-resistant staph aureus that is both vancomycin and tetracycline sensitive so we should be okay from that regard. He arrives today in clinic with a connection between the abscess site and the area on the lateral foot. We've been using silver alginate to all his wound areas. The right third metatarsal head wound is measuring smaller. Using silver alginate on both wound areas 06/20/18; transitioning to doxycycline prescribed by Dr. [name]. The abscess site on the lateral foot that I unroofed 2 weeks ago grew MRSA. That area has largely closed down although the lateral part of his wound on the fifth metatarsal head still probes to bone. I suspect all of this was connected. ooOn the right third metatarsal head smaller looking wound but surrounded by nonviable tissue that once again requires debridement using silver alginate on both areas 06/27/18; on doxycycline 100 twice a day. The abscess on the lateral foot has closed down. Although he still has the wound on the left fifth metatarsal head that extends towards the lateral part of the foot. The most lateral part of this wound probes to bone ooRight third metatarsal head still a deep probing wound with undermining. Nevertheless I elected not to debridement this this week ooIf everything is copious static next week on the left I'm going to attempt a total contact cast 07/04/18; he is tolerating his doxycycline. The abscess on the left lateral foot is closed down although there is still a deep wound here that probes to bone. We will use silver collagen under the total contact cast Silver collagen to the deep wound over the right third metatarsal head is well 07/11/18; he is tolerating doxycycline. The left lateral fifth plantar metatarsal head still probes deeply but I could not probe any bone. We've been using silver collagen and this will be the  second week under the total contact cast ooSilver collagen to deep wound over the right third metatarsal head as well 07/18/18; he is still taking doxycycline. The left lateral fifth plantar metatarsal this week probes deeply with a large amount of exposed bone. Quite a deterioration. He required extensive debridement. Specimens of bone for pathology and CNS obtained ooAlso considerable debridement on the right third metatarsal head 07/25/18; bone for pathology last week from the lateral left foot showed osteomyelitis. CandS of the bone Enterobacter. And Klebsiella. He is now on ciprofloxacin and the doxycycline is stopped [Dr. [name] of infectious disease] area He has an appointment with Dr. [name] tomorrow I'm going to leave the total contact  cast off for this week. May wish to try to reapply that next week or the week after depending on the wound bed looks. I'm not sure if there is an operative option here, previously is followed with Dr. [name] 08/01/18 on evaluation today patient presents for reevaluation. He has been seen by infectious disease, Dr. [name], and he has placed the patient back on Ceftaz currently to be given to him at dialysis three times a week. 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 Fortunately there does not appear to be the evidence of infection at this point in time. Overall the patient has been tolerating the dressing changes without complication. We are no longer due lives in the cast of left foot secondary to the infection obviously. 08/10/18 on evaluation today patient appears to be doing rather well all things considered in regard to his left plantar foot ulcer. He does have a significant callous on the lateral portion of his left foot he wonders if I can help clean that away to some degree today. Fortunately he is having no evidence of infection at this time which is good news. No fevers  chills noted. With that being said his right plantar foot actually does have a significant callous buildup around the wound opening this seems to not be doing as well as I would like at this point. I do believe that he would benefit from sharp debridement at this site. Subsequently I think a total contact cast would be helpful for the right foot as well. 08/17/18; the patient arrived today with a total contact cast on the right foot. This actually looks quite good. He had gone to see Dr. Megan Salon of infectious disease about the osteomyelitis on the left foot. I think he is on IV Fortaz at dialysis although I am not exactly sure of the rationale for the Fortaz at the time of this dictation. He also arrived in today with a swelling on the lateral left foot this is the site of his original wounds in fact when I first saw this man when he was under Dr. Ardeen Garland care I think this was the site of where the wound was located. It was not particularly tender however using a small scalpel I opened it to [place] some moderate amount of purulent drainage. 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 Last MRI was in May that showed progressive osteomyelitis and bone destruction of the cuboid and base of the fifth metatarsal. Last week he had an abscess over this same area this was removed. Culture was negative. ooWe are continuing with a total contact cast to the area on the third metatarsal head on the right and making some good progress here. The patient asked me again about renal transplant. He is not on the list because of the open wounds. 08/31/18; apparently the patient had an interruption in the [place] but he is now back on this at dialysis. Apparently there was a misunderstanding and Dr. Crissie Figures orders. Due to have the MRI of the left foot tonight. ooThe area on the right foot  continues to have callus thick skin and subcutaneous tissue around the wound edge that requires constant debridement however the wound is smaller we are using a total contact cast in this area. Alginate all wounds 09/07/18; without much surprise  the MRI of his left foot showed osteomyelitis in the reminiscent of his fourth metatarsal but also the third metatarsal. She arrives in clinic today with the right foot and a total contact cast. There was purulent drainage coming out of the wound which I have cultured. Marked deterioration here with undermining widely around the wound orifice. Using pickups and a scalpel I remove callus and subcutaneous tissue from a substantial new opening. ooIn a similar fashion the area on the left lateral foot that was blistered last week I have opened this and remove skin and subcutaneous tissue from this area to expose a obvious new wound. I think there is extension and communication between all of this on the left foot. ooThe patient is on [place] at dialysis. Culture done of the right foot. We are clearly not making progress here. ooWe have made him an appointment with Dr. [name] of orthopedics. I think an amputation of the left leg may be discussed. I don't think there is anything that can be done with foot salvage. 09/14/18; culture from the right foot last time showed a very resistant MRSA. This is resistant to doxycycline. I'm going to try to get linezolid at least for a week. He does not have a appointment with Dr. [name] yet. 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 I'll send a message to Dr. [name] of infectious disease. I want the patient to see Dr. [name] to go over the pros and cons of an amputation which I think will be a BKA. The patient had a question about whether this is curative or not. I told him that I thought it would be although spread of staph aureus infection is not unheard of. MRI  of the right foot was done at the end of April 2019 did not show osteomyelitis. The patient last saw Dr. [name] on 9/3. I'll send Dr. [name] a message. I would really like him to weigh the pros and cons of a BKA on the left otherwise he'll probably need IV antibiotics and perhaps hyperbaric oxygen again. He has not had a good response to this in the past. His MRI earlier this month showed progressive damage in the remnants of the fourth and third metatarsal 09/21/2018; sees Dr. [name] of orthopedics next week to discuss a left BKA in response to the osteomyelitis in the left foot. Using silver alginate to all wound areas. He is completing the Zyvox I did put in for him after last culture showed MRSA 09/29/2018; patient saw Dr. [name] of orthopedics discussed the osteomyelitis in the left foot third and fourth metatarsals. He did not recommend urgent surgery but certainly stated the only surgical option would be a BKA. He is communicating with Dr. [name]. The problem here is the instability of the areas on his foot which constantly generate draining abscesses. I will communicate with Dr. [name] about this. He has an appointment on 10/23 10/05/2018; patient sees Dr. [name] on 10/23. I have sent him a secure message not ordered any additional antibiotics for now. Patient continues to have 2 open areas on the left plantar foot at the fifth metatarsal head and the lateral aspect of the foot. These have not changed all that much. The area on the right third met head still has thick callus and surrounding subcutaneous tissue we have been using silver alginate 10/12/2018; patient sees Dr. [name] or colleague on 10/23. 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 The area on the right plantar foot is just about closed. The superior wound on the left fifth metatarsal head is callused over but I am not sure if this is closed. The area below  it is about the same. Considerable amount of callus on the lateral foot. We have been using silver alginate to the wounds 10/19/2018; the patient saw Dr. [name] on 10/22. He wishes to try and continue to save the left foot. He has been given 6 weeks of oral ciprofloxacin. We continue to put a total contact cast on the right foot. The area over the fifth metatarsal head on the left is callused over/perhaps healed but I did not remove the callus to find out. He still has the wound on the left lateral midfoot requiring debridement. We have been using silver alginate to all wound areas 10/26/2018 ooOn the left foot our intake nurse noted some purulent drainage from the inferior wound [currently the only one that is not callused over] ooOn the right foot even though he is in total contact cast a considerable amount of thick black callus and surface eschar. On this side we have been using silver alginate under a total contact cast. oohe remains on ciprofloxacin as prescribed by Dr. [name] 11/02/2018; culture from last week which is done of the probing area on the left midfoot wound showed MRSA "few". I am going to need to contact Dr. [name] which I will do today I think he is going to need IV antibiotics again. The area on the left foot which was callused on the side is clearly separating today all of this was removed a copious amounts of callus and necrotic subcutaneous tissue. ooThe area on the right plantar foot actually remained healthy looking with a healthy granulated base. ooWe are using silver alginate to all wound areas 11/09/2018; unfortunately neither 1 of the patient's wounds areas looks at all satisfactory. On the left he has considerable necrotic debris from the plantar wound laterally over the foot. I removed copious amounts of material including callus skin and subcutaneous tissue. On the right foot he arrives with undermining and frankly purulent drainage. Specimen obtained  for culture and debridement of the callused skin and subcutaneous tissue from around the circumference. Because of this I cannot put him back in a total contact cast but to be truthful we are really unfortunately not making a lot of progress I did put in a secure text message to Dr. [name] wondering about the MRSA on the left foot. He suggested vancomycin although this is not started. I was left wondering if he expected me to send this into dialysis. Has we have more purulence on the right side wait for that result before calling dialysis 11/16/2018; I called dialysis earlier this week to get the vancomycin started. I think he got the first dose on Tuesday. The patient tells me that he fell earlier this week twisting his left foot and ankle and he is swelling. He continues to not look well. He had blood cultures done at dialysis on Tuesday he is not been informed of the results. 12/07/2018; the patient was admitted to hospital from 11/22/2018 through 12/02/2018. He underwent a left BKA. Apparently had staph sepsis. In the meantime being off the right foot this is closed over. He follows up with Dr. [name] this afternoon Readmission: 01/10/19 upon evaluation today patient appears for follow-up in our clinic status post having had a left BKA  on 11/20/18. Subsequent to this he actually had multiple falls in fact he tells me to following which calls the wound to be his apparently. He has been seeing Dr. [name] and his physician assistant in the interim. They actually did want him to come back for reevaluation to see if there's anything we can do for me wound care perspective the help this area to heal more appropriately. The patient states that he does not have a tremendous amount of pain which is good news. No fevers, chills, nausea, or vomiting noted at this time. We have gotten approval from Dr. [name] office had [place] for [place] to treat the patient for his stop wound. This is due to  the fact that the patient was in the 90 day postop global. 1/21; the patient does not have an open wound on the right foot although he does have some pressure areas that will need to be padded when he is transferring. The dehisced surgical wound looks clean although there is some undermining of 1.5 cm superiorly. 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 This appears healthy but there is undermining. TheraSkin #1 2/11; TheraSkin #2 2/25; TheraSkin #3. Wound is measuring smaller 3/10; TheraSkin #4 wound is measuring smaller 3/24; TheraSkin #5 wound is measuring smaller and looks healthy. 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 He has no open area on the right foot although he does have some callused area over the original third metatarsal head wound. The third metatarsal is also subluxed on the right foot. I have warned him today that he cannot consider wearing a prosthesis for at least a month but he can go for measurements. He is going to need to keep the right foot padded is much as possible in his diabetic shoe indefinitely READMISSION 06/12/2019 [name] is a type II diabetic on dialysis. He has been in this clinic multiple times with wounds on his bilateral lower extremities. 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 He also has a history of wounds on his right foot but tells me that everything is going well here. He actually came in here with his prosthesis walking with a cane. We were all really quite gratified to see this He states over the last several weeks he has had a painful area at the tip of the left third finger. His dialysis shunt in his is in the left upper arm and this particularly hurts during dialysis when his fingers get numb and there is a lot of pain in the tip  of his left third finger. I believe he is seen vascular surgery. He had a Doppler done to evaluate for a dialysis steal syndrome. He is going for a banding procedure 1 week from today towards the left AV fistula. I think if that is unsuccessful they will be looking at creating a new shunt. He had a course of doxycycline when he which he finished about a week ago. He has been using Bactroban to the finger 6/25; the patient had his banding procedure and things seem to be going better. He is not having pain in his hand at dialysis. The area on the tip of his finger seems about closed. He did however have a paronychia I the last time I saw  him. I have been advising him to use topical Bactroban washing the finger. Culture I did last time showed a few staph epidermidis which I was willing to dismiss as superficial skin contaminant. He arrives today with the finger wound looking better however the paronychia a seems worse 7/9; 2-week follow-up. He does not have an open wound on the tip of his third left finger. I thought he had an initial ischemic wound on the tip of the finger as well as a paronychia a medially. All of this appears to be closed. 8/6-Patient returns after 3 weeks for follow-up and has a right second met head plantar callus with a significant area of maceration hiding an ulcer ABI repeated today on right - 1.26 8/13-Patient returns at 1 week, the right second met head plantar wound appears to be slightly better, it is surrounded by the callus area we are using silver alginate 8/20; the patient came back to clinic 2 weeks ago with a wound on the right second metatarsal head. He apparently told me he developed callus in this area but also went away from his custom shoes to a pair of running shoes. Using silver alginate. 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 There might be much we can do to offload this although the patient tells me he has a wheelchair that he is  using to try and stay off the wound is much as possible 8/27; right second metatarsal head. Still small punched out area with thick callus and subcutaneous tissue around the wound. We have been using silver collagen. This is always been an issue with this man's wound especially on this foot 9/3; right second met head. Still the same punched-out area with thick callus and subcutaneous tissue around the wound removing the circumference demonstrates repetitively undermining area. I have removed all the subcutaneous tissue associated with this. We have been using silver collagen 9/15; right second metatarsal head. Same punched-out thick callus and subcutaneous tissue around the wound area. Still requiring debridement we have been using silver collagen I changed back to silver alginate X-ray last time showed no evidence of osteomyelitis. Culture showed Klebsiella and he was started and Keflex apparently just 4 days ago 9/24; right second metatarsal head. He is completed the antibiotics I gave him for 10 days. Same punched-out thick callus and subcutaneous tissue around the area. Still requiring debridement. We have been using silver alginate. The area itself looks swollen and this could be just [place] that comes on this area with walking on a prosthesis on the left however I think he needs an MRI and I am going to order that today. There is not an option to offload this further 10/1; right second plantar metatarsal head. MRI booked for October 6. Generally looking better today using silver alginate 10/8; right second plantar metatarsal head. MRI that was done on 10/6 was negative for osteomyelitis noted prior amputations of the second and fourth toes at the [facility]. Prior amputation of the third toe to the base of the middle phalanx. Prior amputation of the fifth toe to the head of the metatarsal. 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 He does not  recall about hearing about this. He did not have any pain but then again he has reduced sensation from diabetic neuropathy 10/15; right second plantar metatarsal head. Still in the same condition callus nonviable subcutaneous tissue which cleans up nicely with debridement but reforms  by the next week. The patient tells me that he is offloading this is much as he can including taking his wheelchair to dialysis. We have been using silver alginate, changed to silver collagen today 10/22; right second plantar metatarsal head. Wound measures smaller but still thick callus around this wound. Still requiring debridement 11/5 right second plantar metatarsal head. Less callus around the wound circumference but still requiring debridement. There is still some undermining which I cleaned out the wound looks healthy but still considerable punched out depth with a relatively small circumference to the wound there is no palpable bone no purulent drainage 11/12; right second plantar metatarsal head. Small wound with thick callus tissue around the wound and significant undermining. We have been using silver collagen after my continuous debridements of this area 11/19; patient arrives in today with purulent drainage coming out of the wound in the second third met head area on the right foot. Serosanguineous thick drainage. Specimen obtained for culture. 11/21/2019 on evaluation today patient appears to be doing well with regard to his foot ulcer. The good news is is culture came back negative for any bacteria there was no growth. The other good news is his x-ray also appears to be doing well. The foot is also measuring much better than what it was. Overall I am very pleased with how things seem to be progressing. 12/22; patient was in [facility] for several weeks due to the death of a family member. He said he stayed off the wound using silver alginate. 01/03/2020. Patient has a small open area with roughly  0.3 cm of circumferential undermining. The orifice looks smaller. We have been using silver collagen. There is not a wound more aggressive way to offload this area as he has a prosthesis on the other leg Objective Constitutional Sitting or standing Blood Pressure is within target range for patient.. Pulse regular and within target range for patient.Marland Kitchen Respirations regular, non-labored and within target range.. Temperature is normal and within the target range for the patient.Marland Kitchen Appears in no distress. Vitals Time Taken: 1:40 PM, Height: 73 in, Weight: 202 lbs, BMI: 26.6, Temperature: 98.2 F, Pulse: 79 bpm, Respiratory Rate: 16 breaths/min, Blood Pressure: 113/61 mmHg, Capillary Blood Glucose: 163 mg/dl. General Notes: glucose per pt report General Notes: Wound exam; large area of thick nonviable skin over the wound however I did not debride this. I am going to see if this will close over on its own although further debridement may be necessary. There is no evidence of surrounding infection. Integumentary (Hair, Skin) Wound #41 status is Open. Original cause of wound was Gradually Appeared. The wound is located on the Right Metatarsal head second. The wound measures 0.2cm length x 0.2cm width x 0.5cm depth; 0.031cm^2 area and 0.016cm^3 volume. There is Fat Layer (Subcutaneous Tissue) Exposed exposed. There is no tunneling noted, however, there is undermining starting at 12:00 and ending at 12:00 with a maximum distance of 0.3cm. There is a small amount of serosanguineous drainage noted. The wound margin is well defined and not attached to the wound base. There is large (67-100%) pink, pale granulation within the wound bed. There is no necrotic tissue within the wound bed. 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 Dressing Change Frequency: Wound #41 Right  Metatarsal head second: Change Dressing every other day. Skin Barriers/Peri-Wound Care: Barrier cream - as needed for maceration, wetness, or redness. Wound Cleansing: May shower and wash wound with soap and water. Primary Wound Dressing: Wound #41 Right Metatarsal head second: Silver Collagen - moisten with normal saline or KY jelly Secondary Dressing: Wound #41 Right Metatarsal head second: Kerlix/Rolled Gauze Dry Gauze Foam Border - patient may use bordered foam to cut into a felt to offload. Other: - felt to periwound and place one below wound. lightly wrap coban to hold dressing in place. felt / pad wound Edema Control: Avoid standing for long periods of time Elevate legs to the level of the heart or above for 30 minutes daily and/or when sitting, a frequency of: - throughout the day. Off-Loading: Other: - felt to periwound and place one below wound. Additional Orders / Instructions: Other: - minimize walking and standing on foot to aid in wound healing and callus buildup. 1. Continue with silver collagen no debridement today 2. Will see if this will continue to close over although I have my doubts. If not will aggressively debride this next time 3. No evidence of infection Electronic Signature(s) Signed: 01/04/2020 4:55:01 AM By: Linton Ham MD Entered By: Linton Ham on 01/03/2020 14:48:48 -------------------------------------------------------------------------------- SuperBill Details Patient Name: Date of Service: [name], [name] 01/03/2020 Medical Record [id number] Patient Account Number: [id number] Date of Birth/Sex: Treating RN: 04-Mar-1951 (69 y.o. [name] Primary Care Provider: [name] Other Clinician: Referring Provider: Treating Provider/Extender:Ladarion Munyon, Jeronimo Greaves, Buena Irish in  Treatment: 29 Diagnosis Coding ICD-10 Codes Code Description E11.621 Type 2 diabetes mellitus with foot ulcer L97.512 Non-pressure chronic ulcer of other part of right foot with fat layer exposed I70.298 Other atherosclerosis of native arteries of extremities, other extremity L03.115 Cellulitis of right lower limb Facility Procedures The patient participates with Medicare or their insurance follows the Medicare Facility Guidelines: CPT4 Code Description Modifier Quantity [id number] [place] VISIT-LEV 3 EST PT 1 Physician Procedures CPT4 Code Description: [id number] - WC PHYS LEVEL 3 - EST PT ICD-10 Diagnosis Description E11.621 Type 2 diabetes mellitus with foot ulcer L97.512 Non-pressure chronic ulcer of other part of right foot with Modifier: fat layer expose Quantity: 1 d Electronic Signature(s) Signed: 01/04/2020 4:55:01 AM By: Linton Ham MD Entered By: Linton Ham on 01/03/2020 14:49:09
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
## 2020-01-17 ENCOUNTER — Other Ambulatory Visit: Payer: Self-pay

## 2020-01-17 ENCOUNTER — Encounter (HOSPITAL_BASED_OUTPATIENT_CLINIC_OR_DEPARTMENT_OTHER): Payer: Medicare Other | Admitting: Internal Medicine

## 2020-01-17 DIAGNOSIS — E11621 Type 2 diabetes mellitus with foot ulcer: Secondary | ICD-10-CM | POA: Diagnosis not present

## 2020-01-17 NOTE — Progress Notes (Signed)
ARIEN, BENINCASA ([id number]) Visit Report for 01/17/2020 Debridement Details Patient Name: Date of Service: DELFORD, WINGERT 01/17/2020 12:45 PM Medical Record [id number] Patient Account Number: [id number] Date of Birth/Sex: 1951-09-07 (68 y.o. M) Treating RN: Primary Care Provider: [name] Other Clinician: Referring Provider: Treating Provider/Extender:Zakaiya Lares, Birdena Crandall in Treatment: 31 Debridement Performed for Wound #41 Right Metatarsal head second Assessment: Performed By: Physician Ricard Dillon., MD Debridement Type: Debridement Severity of Tissue Pre Fat layer exposed Debridement: Level of Consciousness (Pre- Awake and Alert procedure): Pre-procedure Verification/Time Out Taken: Yes - 13:38 Start Time: 13:39 Pain Control: Lidocaine 4% Topical Solution Total Area Debrided (L x W): 0.5 (cm) x 0.5 (cm) = 0.25 (cm) Tissue and other material Viable, Non-Viable, Callus, Subcutaneous, Skin: Dermis , Fibrin/Exudate debrided: Level: Skin/Subcutaneous Tissue Debridement Description: Excisional Instrument: Curette Bleeding: Minimum Hemostasis Achieved: Pressure End Time: 13:42 Procedural Pain: 0 Post Procedural Pain: 0 Response to Treatment: Procedure was tolerated well Level of Consciousness Awake and Alert (Post-procedure): Post Debridement Measurements of Total Wound Length: (cm) 0.2 Width: (cm) 0.2 Depth: (cm) 0.9 Volume: (cm) 0.028 Character of Wound/Ulcer Post Improved Debridement: Severity of Tissue Post Debridement: Fat layer exposed Post Procedure Diagnosis Same as Pre-procedure Electronic Signature(s) Signed: 01/17/2020 5:54:58 PM By: Linton Ham MD Entered By: Linton Ham on 01/17/2020 13:48:35 -------------------------------------------------------------------------------- HPI Details Patient Name: Date of Service: [name] 01/17/2020 12:45 PM Medical Record [id number] Patient Account Number:  [id number] Date of Birth/Sex: Treating RN: 08-23-1951 (69 y.o. M) Primary Care Provider: [name] Other Clinician: Referring Provider: Treating Provider/Extender:Danissa Rundle, Jeronimo Greaves, Buena Irish in Treatment: 31 History of Present Illness Location: Patient presents with a wound to bilateral feet. Quality: Patient reports experiencing essentially no pain. Severity: Mildly severe wound with no evidence of infection Duration: Patient has had the wound for greater than 2 weeks prior to presenting for treatment HPI Description: The patient is a pleasant 69 yrs old bm here for evaluation of ulcers on the plantar aspect of both feet. 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 He has a left arm graft for access. He has been trying to stay off his feet for weeks but does not seem to have any improvement in the wound. He has been seeing someone at the foot center and was referred to the Wound Care center for further evaluation. 12/30/15 the patient has 3 wounds one over the right first metatarsal head and 2 on the left foot at the left fifth and lleft first metatarsal head. All of these look relatively similar. The one over the left fifth probe to bone I could not prove that any of the others did. I note his MRI in November that did not show osteomyelitis. His peripheral pulses seem robust. All of these underwent surgical debridement to remove callus nonviable skin and subcutaneous tissue 01/06/16; the patient had his sutures removed from the right fifth ray amputation. There may be a small open part of this superiorly but otherwise the incision looks good. Areas over his right first, left first and left fifth metatarsal head all underwent surgical debridement as a varying degree of callus, skin and nonviable subcutaneous tissue. The area that is most worrisome is the right fifth metatarsal head which has a wound probes precariously close to  bone. There is no purulent drainage or erythema 01/13/16; I'm not exactly sure of the status of the right fifth ray amputation site however he follows with Dr. [name] later this  week. The area over his right first plantar metatarsal head, left first and fifth plantar metatarsal head are all in the same status. Thick circumferential callus, nonviable subcutaneous tissue. Culture of the left fifth did not culture last week 01/20/16; all of the patient's wounds appear and roughly the same state although his amputation site on the right lateral foot looks better. His wounds over the right first, left first and left fifth metatarsal heads all underwent difficult surgical debridement removing circumferential callus nonviable skin and subcutaneous tissue. There is no overt evidence of infection in these areas. MRI at the end of December of the left foot did not show osteomyelitis, right foot showed osteomyelitis of the right fifth digit he is now status post amputation. 01/27/16 the patient's wounds over his plantar first and fifth metatarsal heads on the left all appear better having been started on a total contact cast last week. They were debridement of circumferential callus and nonviable subcutaneous tissue as was the wound over the first metatarsal head. His surgical incision on the right also had some light surface debridement done. 03/23/2016 -- the patient was doing really well and most of his wounds had almost completely healed but now he came back today with a history of having a discharge from the area of his right foot on the plantar aspect and also between his first and second toe. He also has had some discharge from the left foot. Addendum: I spoke to the PA [name] at the dialysis center whose fax number is [phone]. We discussed the infection the patient has and she will put the patient on vancomycin and Fortaz until the final culture report is back. We will fax this as soon  as available. 03/30/2016 -- left foot x-ray IMPRESSION:No definitive osteomyelitis noted. X-ray of the right foot -- IMPRESSION: 1. Soft tissue swelling. Prior amputation right fifth digit. No acute or focal bony abnormality identified. If osteomyelitis remains a clinical concern, MRI can be obtained. 2. Peripheral vascular disease. His culture reports have grown an MSSA -- and we will fax this report to his hemodialysis center. He was called in a prescription of oral doxycycline but I have told her not to fill this in as he is already on IV antibiotics. 04/06/2016 -- a few days ago, I spoke to the hemodialysis center nurse who had stopped the IV antibiotics and he was given a prescription for doxycycline 100 mg by mouth twice a day for a week and he is on this at the present time. 05/11/2016 -- he has recently seen his PCP this week and his hemoglobin A1c was 7. He is working on his paperwork to get his orthotic shoes. 05/25/2016 -- -- x-ray of the left foot IMPRESSION:No acute bony abnormality. No radiographic changes of acute osteomyelitis. No change since prior study. X-ray of the right foot -- IMPRESSION: Postsurgical changes are seen involving the fifth toe. No evidence of acute osteomyelitis. he developed a large blister on the medial part of his right foot and this opened out and drain fluid. 06/01/2016 -- he has his MRI to be done this afternoon. 06/15/2016 -- MRI of the left forefoot without contrast shows 2 separate regions of cutaneous and subcutaneous edema and possible ulceration and blistering along the ball of the foot. No obvious osteomyelitis identified. MRI of the right foot showed cutaneous and subcutaneous thickening plantar to the first digit sesamoid with an ulcer crater but no underlying osteomyelitis is identified. 06/22/2016 -- the right foot plantar ulcer  has been draining a lot of seropurulent material for the last few days. 06/30/2016 -- spoke to the dialysis  center and I believe I spoke to Andrews a PA at the center who discussed with me and agreed to putting Legrand Como on vancomycin until his cultures arrive. On review of his culture report no WBCs were seen or no organisms were seen and the culture was reincubated for better growth. The final report is back and there were no predominant growth including Streptococcus or Staphylococcus. Clinically though he has a lot of drainage from both wounds a lot of undermining and there is further blebs on the left foot towards the interspace between his first and second toe. 08/10/2016 -- the culture from the right foot showed normal skin flora and there was no Staphylococcus aureus was group A streptococcus isolated. 09/21/2016 -- -- MRI of the right foot was done on 09/13/2016 - IMPRESSION: Findings most consistent with acute osteomyelitis throughout the great toe, sesamoid bones and plantar aspect of the head of the first metatarsal. Fluid in the sheath of the flexor tendon of the great toe could be sympathetic but is worrisome for septic tenosynovitis. First MTP joint effusion worrisome for septic joint. He was admitted to the hospital on 09/11/2016 and treated for a fever with vancomycin and cefepime. He was seen by Dr. Bobby Rumpf of infectious disease who recommended 6 weeks treatment with vancomycin and ceftazidime with his hemodialysis and to continue to see as in the wound clinic. Vascular consult was pending. The patient was discharged home on 09/14/2016 and he would continue with IV antibiotics for 6 weeks. 09/28/16 wound appears reasonably healthy. Continuing with total contact cast 10/05/16 wound is smaller and looking healthy. Continue with total contact cast. He continues on IV antibiotics 10/12/2016 -- he has developed a new wound on the dorsal aspect of his left big toe and this is a superficial injury with no surrounding cellulitis. He has completed 30 days of IV antibiotics and is now  ready to start his hyperbaric oxygen therapy as per his insurance company's recommendation. 10/19/2016 -- after the cast was removed on the right side he has got good resolution of his ulceration on the right plantar foot. he has a new wound on the left plantar foot in the region of his fourth metatarsal and this will need sharp debridement. 10/26/2016 -- Xray of the right foot complete: IMPRESSION: Changes consistent with osteomyelitis involving the head of the first metatarsal and base of the first proximal phalanx. The sesamoid bones are also likely involved given their positioning. 11/02/2016 -- there still awaiting insurance clearance for his hyperbaric oxygen therapy and hopefully he will begin treatment soon. 11/09/2016 -- started with hyperbaric oxygen therapy and had some barotrauma to the right ear and this was seen by ENT who was prescribed Afrin drops and would probably continue with HBO and he is scheduled for myringotomy tubes on Friday 11/16/2016 -- he had his myringotomy tubes placed on Friday and has been doing much better after that with some fluid draining out after hyperbaric treatment today. The pain was much better. 11/30/2016 -- over the last 2 days he noticed a swelling and change of color of his right second toe and this had been draining minimal fluid. 12/07/2016 -- x-ray of the right foot -- IMPRESSION:1. Progressive ulceration at the distal aspect of the second digit with significant soft tissue swelling and osseous changes in the distal phalanx compatible with osteomyelitis. 2. Chronic osteomyelitis at the first MTP  joint. 3. Ulcerations at the second and third toes as well without definite osseous changes. Osteomyelitis is not excluded. 4. Fifth digit amputation. On 12/01/2016 I spoke to Dr. Bobby Rumpf, the infectious disease specialist, who kindly agreed to treat this with IV antibiotics and he would call in the order to the dialysis center and this has been  discussed in detail with the patient who will make the appropriate arrangements. The patient will also book an appointment as soon as possible to see Dr. [name] in the office. Of note the patient has not been on antibiotics this entire week as the dialysis center did not receive any orders from Dr. [name]. I got in touch with Dr. [name] who tells me the patient has an appointment to see him this coming Wednesday. 12/14/2016 -- the patient is on ceftazidime and vancomycin during his dialysis, and I understand this was put on by his nephrologist Dr. Raliegh Ip possibly after speaking with infectious disease Dr. [name] 12/23/16; patient was on my schedule today for a wound evaluation as he had difficulties with his schedule earlier this week. I note that he is on vancomycin and ceftazidine at dialysis. In spite of this he arrives today with a new wound on the base of the right second toe this easily probes to bone. The known wound at the tip of the second toe With this area. He also has a superficial area on the medial aspect of the third toe on the side of the DIP. This does not appear to have much depth. The area on the plantar left foot is a deep area but did not probe the bone 12/28/16 -- he has brought in some lab work and the most recent labs done showed a hemoglobin of 12.1 hematocrit of 36.3, neutrophils of 51% WBC count of 5.6, BN of 53, albumin of 4.1 globulin of 3.7, vancomycin 13 g per mL. 01/04/2017 -- he saw Dr. [name] who has recommended a amputation of the right second toe and he is awaiting this date. He sees Dr. Bobby Rumpf of infectious disease tomorrow. The bone culture taken on 12/28/2016 had no growth in 2 days. 01/11/2017 -- was seen by Dr. Bobby Rumpf regarding the management and has recommended a eval by vascular surgeons. He recommended to continue the antibiotics during his hemodialysis. Xray of the left foot -- IMPRESSION: No acute fracture, dislocation, or osseous  erosion identified. 01/18/2017 -- he has his vascular workup later today and he is going to have his right foot second toe amputation this coming Friday by Dr. [name]. We have also put in for a another 30 treatments with hyperbaric oxygen therapy 01/25/2017 -- I have reviewed Dr. Donnetta Hutching is vascular report from last week where he reviewed him and thought his vascular function was good enough to heal his amputation site and no further tests were recommended. He also had his orthopedic related to surgery which is still pending the notes and we will review these next week. 02/01/2017 -- the operative remote of Dr. 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 02/08/2017 --he was seen by Dr. [name] on 02/03/2017 -- patient is on [place]. after a thorough review he had  recommended to continue with antibiotics during hemodialysis. The patient was seen by Dr. [name], but I do not find any follow-up note on the electronic medical record. he has removed the dressing over the right foot to remove the sutures and asked him to see him back in a month's time 02/22/2017 -- patient has been febrile and has been having symptoms of the upper respiratory tract infection but has not been checked for the flu and it's been over 4 days now. He says he is feeling better today. Some drainage between his left first and second toe and this needed to be looked at 03/08/2017 -- was seen by Dr. Bobby Rumpf on 03/07/2017 after review he will stop his antibiotics and see him back in a month to see if he is feeling well. 03/15/2017 -- he has completed his course of  hyperbaric oxygen therapy and is doing fine with his health otherwise. 03/22/2017 -- his nutritionist at the dialysis center has recommended a protein supplement to help build his collagen and we will prescribe this for him when he has the details 05/03/2017 -- he recently noticed the area on the plantar aspect of his fifth metatarsal head which opened out and had minimal drainage. 05/10/2017 -- -- x-ray of the left foot -- IMPRESSION:1. No convincing conventional radiographic evidence of active osteomyelitis.2. Active soft tissue ulceration at the tip of the fifth digit, and at the lateral aspect of the foot adjacent the base of the fifth metatarsal. 3. Surgical changes of prior fourth toe amputation. 4. 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 5. Small vessel atherosclerotic vascular calcifications. 6. 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 ======= Readmission after 5 weeks: 06/15/2017 -- the patient returns after 5 weeks having had an MRI done on 05/17/2017 MRI of the left foot without contrast showed soft tissue ulcer overlying the base of the fifth metatarsal. 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 No drainable fluid collection to suggest an abscess. He was in hospital between 05/16/2017 and 05/25/2017 -- and on discharge was asked to follow-up with Dr. [name] and Dr. [name]. He was treated 2 weeks post discharge with vancomycin plus ceftriaxone with hemodialysis and oral metronidazole 500 mg 3 times a day. 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 last hemoglobin A1c was 6.7% He had a postoperative MR of the foot on 05/23/2017 -- which showed no new areas of cortical bone loss, edema or soft tissue ulceration to suggest osteomyelitis. the patient has completed his course of  IV antibiotics during dialysis and is to see the infectious disease doctor tomorrow. Dr. [name] had seen him after suture removal and asked him to keep the wound with a dry dressing 06/21/2017 -- he was seen by Dr. Bobby Rumpf of infectious disease on 06/16/2017 -- he stopped his Flagyl and will see him back in 6 weeks. He was asked to follow-up with Dr. [name] and with the wound clinic clinic. 07/05/17; bone biopsy from last time showed acute osteomyelitis. This is from the reminiscent in the left fifth metatarsal. Culture result is apparently still pending [holding for anaerobe}. From my understanding in this case this is been a progressive necrotic wound which is deteriorated markedly over the last 3 weeks since he returned here. He now has a large area of exposed bone which was biopsied and cultured  last week. Dr. Graylon Good has put him on vancomycin and Fortaz during his hemodialysis and Flagyl orally. He is to see Dr. [name] next week 07/19/2017 -- the patient was reviewed by Dr. [name] of orthopedics who reviewed the case in detail and agreed with the plan to continue with IV antibiotics, aggressive wound care and hyperbaric oxygen therapy. He would see him back in 3 weeks' time 08/09/2017 -- saw Dr. Bobby Rumpf on 08/03/2017 -he was restarted on his antibiotics for 6 weeks which included vanco, ceftaz and Flagyl. he recommended continuing his antibiotics for 6 weeks and reevaluate his completion date. He would continue with wound care and hyperbaric oxygen therapy. 08/16/2017 -- I understand he will be completing his 6 weeks of antibiotics sometime later this week. 09/19/2017 -- he has been without his wound VAC for the last week due to lack of supplies. He has been packing his wound with silver alginate. 10/04/2017 -- he has an appointment with Dr. [name] tomorrow and hence we will not apply the wound VAC after his dressing changes today. 10/11/2017 -- he was seen by Dr. Bobby Rumpf on 10/05/2017, noted that the patient was currently off antibiotics, and after thorough review he recommended he follow-up with the wound care as per plan and no further antibiotics at this point. 11/29/17; patient is arrived for the wound on his left lateral foot.Marland Kitchen He is completed IV antibiotics and 2 rounds of hyperbaric oxygen for treatment of underlying osteomyelitis. He arrives today with a surface on most of the wound area however our intake nurse noted drainage from the superior aspect. This was brought to my attention. 12/13/2017 -- the right plantar foot had a large bleb and once the bleb was opened out a large callus and subcutaneous debris was removed and he has a plantar ulcer near the fourth metatarsal head 12/28/17 on evaluation today patient appears to be doing acutely worse in regard to his left foot. The wound which has been appearing to do better as now open up more deeply there is bone palpable at the base of the wound unfortunately. He tells me that this feels "like it did when he had osteomyelitis previously" he also noted that his second toe on the left foot appears to be doing worse and is swollen there does appear to be some fluid collected underneath. His right foot plantar ulcer appears to be doing somewhat better at this point and there really is no complication at the site currently. No fevers, chills, nausea, or vomiting noted at this time. Patient states that he normally has no pain at this site however. Today he is having significant pain. 01/03/18; culture done last week showed methicillin sensitive staph aureus and group B strep. He was on [place] however he arrives today with a fever of 101. He has a functional dialysis shunt in his left arm but has had no pain here. No cough. He still makes some urine no dysuria he did have abdominal pain nausea and diarrhea over the weekend but is not had any diarrhea since yesterday. Otherwise he has no specific  complaints 01/05/18; the patient returns today in follow-up for his presentation from 1/8. At that point he was febrile. I gave him some Levaquin adjusted for his dialysis status. He tells me the fever broke that night and it is not likely that this was actually a wound infection. He is gone on to have an MRI of the left foot; this showed interval development of an abnormal signal from  the base of the third and fourth metatarsal and in the cuboid reminiscent consistent with osteomyelitis. There is no mention of the left second toe I think which was a concern when it was ordered. The patient is taking his Levaquin 01/10/18; the patient has completed his antibiotics today. The area on the left lateral foot is smaller but it still probes easily to bone. He has underlying osteomyelitis here and sees Dr. Megan Salon of infectious disease this week The area on the right third plantar metatarsal head still requires debridement not much change in dimensions He has a new wound on the medial tip of his right third toe again this probes to bone. He only noticed this 2 days ago 01/17/18; the patient saw Dr. [name] last week and he is back on Vanco and Fortaz at dialysis. 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 We are previously seeing him for a wound also on the base of the fourth med head on the right. X- ray of this area did not show osteomyelitis in relation to a new wound on the tip of his right third toe. Again this probes to bone. Finally his second toe on the left which didn't really show anything on the MRI at least no report appears to have a separated cutaneous area which I think is going to come right off the tip of his toe and leave exposed bone. Whether this is infectious or ischemic I am not clear Culture I did from the third toe last week which was his new wound was negative. Plain  x-ray on the right foot did not show osteomyelitis in the toes 01/24/18; the patient is going went to see Dr. [name]. He is going to have a amputation of the left second and right third toe although paradoxically the left second toe looks better than last week. We have treating a deep probing wound on the left lateral foot and the area over the third metatarsal head on the right foot. 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 Also a debridement including bone of the left lateral foot and a closure which is still sutured. This is a bit surprising. Also apparent debridement of the base of the right third toe wound. 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 I'm not sure exactly where we are in the frame of things here. The wound on the left lateral foot is still sutured also the amputation sites of the left second and right third toe. He is been using Xeroform to the sutured areas silver alginate to the right foot 02/14/18; he continues on Rocephin at dialysis for osteomyelitis. I still don't have a good sense of where we are in the treatment duration. The wounds on the left lateral foot had the sutures removed and this clearly still probes deeply. We debrided the area with a #5 curet. We'll use silver alginate to the wound the area over the right third met head also required debridement of callus skin and subcutaneous tissue and necrotic debris over the wound surface. This tunnels superiorly but I did not unroofed this today. The areas on the tip of his toes have  a crusted surface eschar I did not debridement this either. 02/21/18; he continues on Rocephin at dialysis for osteomyelitis. I don't have a good sense of where we are and the treatment duration. The wounds on the  left lateral foot is callused over on presentation but requires debridement. The area on the right third med head plantar aspect actually is measuring smaller 02/28/18;patient is on Vanco and Fortaz at dialysis, I'm not sure why I thought this was Rocephin above unless it is been changed. I don't have a good sense of time frame here. Using silver collagen to the area on the left lateral foot and right third med head plantar foot 03/07/18; patient is completing bank and Fortaz at dialysis soon. He is using Silver collagen to the area on the left lateral foot and the right third metatarsal head. He has a smaller superficial wound distal to the left lateral foot wound. 03/14/18-he is here in follow-up evaluation for multiple ulcerations to his bilateral feet. He presents with a new ulceration to the plantar aspect of the left foot underneath the blister, this was deroofed to reveal a partial thickness ulcer. He is voicing no complaints or concerns, tolerated dialysis yesterday. We will continue with same treatment plan and he will follow-up next week 03/21/18; the patient has a area on the lateral left foot which still has a small probing area. The overall surface area of the wound is better. He presented with a new ulceration on the left foot plantar fifth metatarsal head last week. The area on the right third metatarsal head appears smaller.using silver alginate to all wound areas. The patient is changing the dressing himself. He is using a Darco forefoot offloading are on the right and a healing sandal on the left 03/28/18; original wound left lateral foot. Still nowhere close to looking like a heeling surface secondary wound on the left lateral foot at the base of his question fifth metatarsal which was new last week Right third metatarsal head. We have been using silver alginate all wounds 04/04/18; the original wound on the left lateral foot again heavy callus surrounding thick nonviable subcutaneous  tissue all requiring debridement. The new wound from 2 weeks ago just near this at the base of the fifth metatarsal head still looks about the same Unfortunately there is been deterioration in the third medical head wound on the right which now probes to bone. I must say this was a superficial wound at one point in time and I really don't have a good frame of reference year. I'll have to go back to look through records about what we know about the right foot. 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 He is also been to see Dr. [name]. Somewhere in here as somebody is ordered a VASCULAR evaluation which was done on 03/22/18. On the right is anterior tibial artery was monophasic triphasic at the posterior tibial artery. On the left anterior tibial artery monophasic posterior tibial artery monophasic. ABI in the right was 1.43 on the left 1.21. TBIs on the right at 0.41 and on the left at 0.32. A vascular consult was recommended and I think has been arranged. 04/11/18; Mr. Shin has not had an MRI of the right foot since 2017. Recent x-ray of the right foot done in January and February was negative. However he has had a major deterioration in the wound over the third met head. He  is completed antibiotics last week at dialysis [place]. I think he is going to see vascular in follow-up. The areas on the left lateral foot and left plantar fifth metatarsal head both look satisfactory. I debrided both of these areas although the tissue here looks good. The area on the left lateral foot once probe to bone it certainly does not do that now 04/18/18; MRI of the right foot is on Thursday. He has what looks to be serosanguineous purulent drainage coming out of the wound over the right third metatarsal head today. He completed antibiotics bank and Fortaz 2 weeks ago at dialysis. He has a vascular follow-up  with regards to his arterial insufficiency although I don't exactly see when that is booked. The areas on the left foot including the lateral left foot and the plantar left fifth metatarsal head look about the same. 04/25/18-He is here in follow-up evaluation for bilateral foot ulcers. MRI obtained was negative for osteomyelitis. Wound culture was negative. We will continue with same treatment plan he'll follow-up next week 05/02/18; the right plantar foot wound over the third metatarsal head actually looks better than when I last saw this. His MRI was negative for osteomyelitis wound culture was negative. On the left plantar foot both wounds on the plantar fifth metatarsal head and on the lateral foot both are covered in a very hard circumferential callus. 05/09/18; right plantar foot wound over the third metatarsal head stable from last week. Left plantar foot wound over the fifth metatarsal head also stable but with callus around both wound areas The area on the left lateral foot had thick callus over a surface and I had some thoughts about leaving this intact however it felt boggy. We've been using silver alginate all wounds 05/16/18; since the patient was last here he was hospitalized from 5/15 through 5/19. 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 This grew MRSA. He was placed on vancomycin.MRI of the left foot suggested "progressive" osteomyelitis and bone destruction of the cuboid and the base of the fifth metatarsal. Stable erosive changes of the base of the second metatarsal. I'm wondering if they're aware that he had surgery and debridement in the area of the underlying bone previously by Dr. [name]. In any case, He was also revascularized with an angioplasty and stenting of the left tibial peroneal trunk and angioplasty of the left posterior tibial artery. He is on vancomycin at  dialysis. He has a 2 week follow-up with infectious disease. He has vascular surgery follow-up. He was started on Plavix. 05/23/18; the patient's wound on the lateral left foot at the level of the fifth metatarsal head and the plantar wound on the plantar fifth metatarsal head both look better. The area on the right third metatarsal head still has depth and undermining. We've been using silver alginate. He is on vancomycin. He has been revascularized on the left 05/30/18; he continues on vancomycin at dialysis. Revascularized on the left. The area on the left lateral foot is just about closed. Unfortunately the area over the plantar fifth metatarsal head undermining superiorly as does the area over the right third metatarsal head. Both of these significantly deteriorated from last week. He has appointments next week with infectious disease and orthopedics I delayed putting on a cast on the left until those appointments which therefore we made we'll bring him in on Friday the 14th  with the idea of a cast on the left foot 06/06/18; he continues on vancomycin at dialysis. Revascularized on the left. He arrives today with a ballotable swelling just above the area on the left lateral foot where his previous wound was. He has no pain but he is reasonably insensate. The difficult areas on the plantar left fifth metatarsal head looks stable whereas the area on the right third plantar metatarsal head about the same as last week there is undermining here although I did not unroofed this today. He required a considerable debridement of the swelling on the left lateral foot area and I unroofed an abscess with a copious amount of brown purulent material which I obtained for culture. I don't believe during his recent hospitalization he had any further imaging although I need to review this. Previous cultures from this area done in this clinic showed MRSA, I would be surprised if this is not what this is currently  even though he is on vancomycin. 06/13/18; he continues on vancomycin at dialysis however he finishes this on Sunday and then graduates the doxycycline previously prescribed by Dr. [name]. The abscess site on the lateral foot that I unroofed last time grew moderate amounts of methicillin-resistant staph aureus that is both vancomycin and tetracycline sensitive so we should be okay from that regard. He arrives today in clinic with a connection between the abscess site and the area on the lateral foot. We've been using silver alginate to all his wound areas. The right third metatarsal head wound is measuring smaller. Using silver alginate on both wound areas 06/20/18; transitioning to doxycycline prescribed by Dr. [name]. The abscess site on the lateral foot that I unroofed 2 weeks ago grew MRSA. That area has largely closed down although the lateral part of his wound on the fifth metatarsal head still probes to bone. I suspect all of this was connected. On the right third metatarsal head smaller looking wound but surrounded by nonviable tissue that once again requires debridement using silver alginate on both areas 06/27/18; on doxycycline 100 twice a day. The abscess on the lateral foot has closed down. Although he still has the wound on the left fifth metatarsal head that extends towards the lateral part of the foot. The most lateral part of this wound probes to bone Right third metatarsal head still a deep probing wound with undermining. Nevertheless I elected not to debridement this this week If everything is copious static next week on the left I'm going to attempt a total contact cast 07/04/18; he is tolerating his doxycycline. The abscess on the left lateral foot is closed down although there is still a deep wound here that probes to bone. We will use silver collagen under the total contact cast Silver collagen to the deep wound over the right third metatarsal head is well 07/11/18; he is  tolerating doxycycline. The left lateral fifth plantar metatarsal head still probes deeply but I could not probe any bone. We've been using silver collagen and this will be the second week under the total contact cast Silver collagen to deep wound over the right third metatarsal head as well 07/18/18; he is still taking doxycycline. The left lateral fifth plantar metatarsal this week probes deeply with a large amount of exposed bone. Quite a deterioration. He required extensive debridement. Specimens of bone for pathology and CNS obtained Also considerable debridement on the right third metatarsal head 07/25/18; bone for pathology last week from the lateral left foot showed osteomyelitis. CandS  of the bone Enterobacter. And Klebsiella. He is now on ciprofloxacin and the doxycycline is stopped [Dr. [name] of infectious disease] area He has an appointment with Dr. [name] tomorrow I'm going to leave the total contact cast off for this week. May wish to try to reapply that next week or the week after depending on the wound bed looks. I'm not sure if there is an operative option here, previously is followed with Dr. [name] 08/01/18 on evaluation today patient presents for reevaluation. He has been seen by infectious disease, Dr. [name], and he has placed the patient back on Ceftaz currently to be given to him at dialysis three times a week. 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 Fortunately there does not appear to be the evidence of infection at this point in time. Overall the patient has been tolerating the dressing changes without complication. We are no longer due lives in the cast of left foot secondary to the infection obviously. 08/10/18 on evaluation today patient appears to be doing rather well all things considered in regard to his left plantar foot ulcer. He does have a significant callous on the lateral portion of his  left foot he wonders if I can help clean that away to some degree today. Fortunately he is having no evidence of infection at this time which is good news. No fevers chills noted. With that being said his right plantar foot actually does have a significant callous buildup around the wound opening this seems to not be doing as well as I would like at this point. I do believe that he would benefit from sharp debridement at this site. Subsequently I think a total contact cast would be helpful for the right foot as well. 08/17/18; the patient arrived today with a total contact cast on the right foot. This actually looks quite good. He had gone to see Dr. Megan Salon of infectious disease about the osteomyelitis on the left foot. I think he is on IV Fortaz at dialysis although I am not exactly sure of the rationale for the Fortaz at the time of this dictation. He also arrived in today with a swelling on the lateral left foot this is the site of his original wounds in fact when I first saw this man when he was under Dr. Ardeen Garland care I think this was the site of where the wound was located. It was not particularly tender however using a small scalpel I opened it to [place] some moderate amount of purulent drainage. 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 Last MRI was in May that showed progressive osteomyelitis and bone destruction of the cuboid and base of the fifth metatarsal. Last week he had an abscess over this same area this was removed. Culture was negative. We are continuing with a total contact cast to the area on the third metatarsal head on the right and making some good progress here. The patient asked me again about renal transplant. He is not on the list because of the open wounds. 08/31/18; apparently the patient had an interruption in the [place] but he is now back on  this at dialysis. Apparently there was a misunderstanding and Dr. Crissie Figures orders. Due to have the MRI of the left foot tonight. The area on the right foot continues to  have callus thick skin and subcutaneous tissue around the wound edge that requires constant debridement however the wound is smaller we are using a total contact cast in this area. Alginate all wounds 09/07/18; without much surprise the MRI of his left foot showed osteomyelitis in the reminiscent of his fourth metatarsal but also the third metatarsal. She arrives in clinic today with the right foot and a total contact cast. There was purulent drainage coming out of the wound which I have cultured. Marked deterioration here with undermining widely around the wound orifice. Using pickups and a scalpel I remove callus and subcutaneous tissue from a substantial new opening. In a similar fashion the area on the left lateral foot that was blistered last week I have opened this and remove skin and subcutaneous tissue from this area to expose a obvious new wound. I think there is extension and communication between all of this on the left foot. The patient is on Fortaz at dialysis. Culture done of the right foot. We are clearly not making progress here. We have made him an appointment with Dr. [name] of orthopedics. I think an amputation of the left leg may be discussed. I don't think there is anything that can be done with foot salvage. 09/14/18; culture from the right foot last time showed a very resistant MRSA. This is resistant to doxycycline. I'm going to try to get linezolid at least for a week. He does not have a appointment with Dr. [name] yet. 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 I'll send a message to Dr. [name] of infectious disease. I want the patient to see Dr. [name] to go over the pros and cons of an amputation which I think will be a BKA. The patient had a  question about whether this is curative or not. I told him that I thought it would be although spread of staph aureus infection is not unheard of. MRI of the right foot was done at the end of April 2019 did not show osteomyelitis. The patient last saw Dr. [name] on 9/3. I'll send Dr. [name] a message. I would really like him to weigh the pros and cons of a BKA on the left otherwise he'll probably need IV antibiotics and perhaps hyperbaric oxygen again. He has not had a good response to this in the past. His MRI earlier this month showed progressive damage in the remnants of the fourth and third metatarsal 09/21/2018; sees Dr. [name] of orthopedics next week to discuss a left BKA in response to the osteomyelitis in the left foot. Using silver alginate to all wound areas. He is completing the Zyvox I did put in for him after last culture showed MRSA 09/29/2018; patient saw Dr. [name] of orthopedics discussed the osteomyelitis in the left foot third and fourth metatarsals. He did not recommend urgent surgery but certainly stated the only surgical option would be a BKA. He is communicating with Dr. [name]. The problem here is the instability of the areas on his foot which constantly generate draining abscesses. I will communicate with Dr. [name] about this. He has an appointment on 10/23 10/05/2018; patient sees Dr. [name] on 10/23. I have sent him a secure message not ordered any additional antibiotics for now. Patient continues to have 2 open areas on the left plantar foot at the fifth metatarsal head and the lateral aspect of the foot. These have not changed all that much. The area  on the right third met head still has thick callus and surrounding subcutaneous tissue we have been using silver alginate 10/12/2018; patient sees Dr. [name] or colleague on 10/23. 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 The area on the right plantar  foot is just about closed. The superior wound on the left fifth metatarsal head is callused over but I am not sure if this is closed. The area below it is about the same. Considerable amount of callus on the lateral foot. We have been using silver alginate to the wounds 10/19/2018; the patient saw Dr. [name] on 10/22. He wishes to try and continue to save the left foot. He has been given 6 weeks of oral ciprofloxacin. We continue to put a total contact cast on the right foot. The area over the fifth metatarsal head on the left is callused over/perhaps healed but I did not remove the callus to find out. He still has the wound on the left lateral midfoot requiring debridement. We have been using silver alginate to all wound areas 10/26/2018 On the left foot our intake nurse noted some purulent drainage from the inferior wound [currently the only one that is not callused over] On the right foot even though he is in total contact cast a considerable amount of thick black callus and surface eschar. On this side we have been using silver alginate under a total contact cast. he remains on ciprofloxacin as prescribed by Dr. [name] 11/02/2018; culture from last week which is done of the probing area on the left midfoot wound showed MRSA "few". I am going to need to contact Dr. [name] which I will do today I think he is going to need IV antibiotics again. The area on the left foot which was callused on the side is clearly separating today all of this was removed a copious amounts of callus and necrotic subcutaneous tissue. The area on the right plantar foot actually remained healthy looking with a healthy granulated base. We are using silver alginate to all wound areas 11/09/2018; unfortunately neither 1 of the patient's wounds areas looks at all satisfactory. On the left he has considerable necrotic debris from the plantar wound laterally over the foot. I removed copious amounts of material including  callus skin and subcutaneous tissue. On the right foot he arrives with undermining and frankly purulent drainage. Specimen obtained for culture and debridement of the callused skin and subcutaneous tissue from around the circumference. Because of this I cannot put him back in a total contact cast but to be truthful we are really unfortunately not making a lot of progress I did put in a secure text message to Dr. [name] wondering about the MRSA on the left foot. He suggested vancomycin although this is not started. I was left wondering if he expected me to send this into dialysis. Has we have more purulence on the right side wait for that result before calling dialysis 11/16/2018; I called dialysis earlier this week to get the vancomycin started. I think he got the first dose on Tuesday. The patient tells me that he fell earlier this week twisting his left foot and ankle and he is swelling. He continues to not look well. He had blood cultures done at dialysis on Tuesday he is not been informed of the results. 12/07/2018; the patient was admitted to hospital from 11/22/2018 through 12/02/2018. He underwent a left BKA. Apparently had staph sepsis. In the  meantime being off the right foot this is closed over. He follows up with Dr. 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 Subsequent to this he actually had multiple falls in fact he tells me to following which calls the wound to be his apparently. He has been seeing Dr. [name] and his physician assistant in the interim. They actually did want him to come back for reevaluation to see if there's anything we can do for me wound care perspective the help this area to heal more appropriately. The patient states that he does not have a tremendous amount of pain which is good news. No fevers, chills, nausea, or vomiting noted at this time. We  have gotten approval from Dr. [name] office had [facility] for [place] to treat the patient for his stop wound. This is due to the fact that the patient was in the 90 day postop global. 1/21; the patient does not have an open wound on the right foot although he does have some pressure areas that will need to be padded when he is transferring. The dehisced surgical wound looks clean although there is some undermining of 1.5 cm superiorly. 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 This appears healthy but there is undermining. TheraSkin #1 2/11; TheraSkin #2 2/25; TheraSkin #3. Wound is measuring smaller 3/10; TheraSkin #4 wound is measuring smaller 3/24; TheraSkin #5 wound is measuring smaller and looks healthy. 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 He has no open area on the right foot although he does have some callused area over the original third metatarsal head wound. The third metatarsal is also subluxed on the right foot. I have warned him today that he cannot consider wearing a prosthesis for at least a month but he can go for measurements. He is going to need to keep the right foot padded is much as possible in his diabetic shoe indefinitely READMISSION 06/12/2019 Mr. Platts is a type II diabetic on dialysis. He has been in this clinic multiple times with wounds on his bilateral lower extremities. 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 He also has a history of wounds on his right foot but tells me that everything is going well here. He actually came in here with his prosthesis walking with a cane. We were all really quite gratified to see this He states over the last several weeks he has had a painful area at the tip of the left third finger. His dialysis shunt in his is  in the left upper arm and this particularly hurts during dialysis when his fingers get numb and there is a lot of pain in the tip of his left third finger. I believe he is seen vascular surgery. He had a Doppler done to evaluate for a dialysis steal syndrome. He is going for a banding procedure 1 week from today towards the left AV fistula. I think if that is unsuccessful they will be looking at creating a new shunt. He had a course of doxycycline when he which he finished about a week ago. He has been using Bactroban to the finger 6/25; the patient had his banding procedure and things seem  to be going better. He is not having pain in his hand at dialysis. The area on the tip of his finger seems about closed. He did however have a paronychia I the last time I saw him. I have been advising him to use topical Bactroban washing the finger. Culture I did last time showed a few staph epidermidis which I was willing to dismiss as superficial skin contaminant. He arrives today with the finger wound looking better however the paronychia a seems worse 7/9; 2-week follow-up. He does not have an open wound on the tip of his third left finger. I thought he had an initial ischemic wound on the tip of the finger as well as a paronychia a medially. All of this appears to be closed. 8/6-Patient returns after 3 weeks for follow-up and has a right second met head plantar callus with a significant area of maceration hiding an ulcer ABI repeated today on right - 1.26 8/13-Patient returns at 1 week, the right second met head plantar wound appears to be slightly better, it is surrounded by the callus area we are using silver alginate 8/20; the patient came back to clinic 2 weeks ago with a wound on the right second metatarsal head. He apparently told me he developed callus in this area but also went away from his custom shoes to a pair of running shoes. Using silver alginate. He of course has the left BKA and  prosthesis on the left side. There might be much we can do to offload this although the patient tells me he has a wheelchair that he is using to try and stay off the wound is much as possible 8/27; right second metatarsal head. Still small punched out area with thick callus and subcutaneous tissue around the wound. We have been using silver collagen. This is always been an issue with this man's wound especially on this foot 9/3; right second met head. Still the same punched-out area with thick callus and subcutaneous tissue around the wound removing the circumference demonstrates repetitively undermining area. I have removed all the subcutaneous tissue associated with this. We have been using silver collagen 9/15; right second metatarsal head. Same punched-out thick callus and subcutaneous tissue around the wound area. Still requiring debridement we have been using silver collagen I changed back to silver alginate X-ray last time showed no evidence of osteomyelitis. Culture showed Klebsiella and he was started and Keflex apparently just 4 days ago 9/24; right second metatarsal head. He is completed the antibiotics I gave him for 10 days. Same punched-out thick callus and subcutaneous tissue around the area. Still requiring debridement. We have been using silver alginate. The area itself looks swollen and this could be just [place] that comes on this area with walking on a prosthesis on the left however I think he needs an MRI and I am going to order that today. There is not an option to offload this further 10/1; right second plantar metatarsal head. MRI booked for October 6. Generally looking better today using silver alginate 10/8; right second plantar metatarsal head. MRI that was done on 10/6 was negative for osteomyelitis noted prior amputations of the second and fourth toes at the MTP. Prior amputation of the third toe to the base of the middle phalanx. Prior amputation of the fifth toe  to the head of the metatarsal. 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 He does not recall about hearing about this. He did  not have any pain but then again he has reduced sensation from diabetic neuropathy 10/15; right second plantar metatarsal head. Still in the same condition callus nonviable subcutaneous tissue which cleans up nicely with debridement but reforms by the next week. The patient tells me that he is offloading this is much as he can including taking his wheelchair to dialysis. We have been using silver alginate, changed to silver collagen today 10/22; right second plantar metatarsal head. Wound measures smaller but still thick callus around this wound. Still requiring debridement 11/5 right second plantar metatarsal head. Less callus around the wound circumference but still requiring debridement. There is still some undermining which I cleaned out the wound looks healthy but still considerable punched out depth with a relatively small circumference to the wound there is no palpable bone no purulent drainage 11/12; right second plantar metatarsal head. Small wound with thick callus tissue around the wound and significant undermining. We have been using silver collagen after my continuous debridements of this area 11/19; patient arrives in today with purulent drainage coming out of the wound in the second third met head area on the right foot. Serosanguineous thick drainage. Specimen obtained for culture. 11/21/2019 on evaluation today patient appears to be doing well with regard to his foot ulcer. The good news is is culture came back negative for any bacteria there was no growth. The other good news is his x-ray also appears to be doing well. The foot is also measuring much better than what it was. Overall I am very pleased with how things seem to be progressing. 12/22; patient was in [facility] for several weeks due to the  death of a family member. He said he stayed off the wound using silver alginate. 01/03/2020. Patient has a small open area with roughly 0.3 cm of circumferential undermining. The orifice looks smaller. We have been using silver collagen. There is not a wound more aggressive way to offload this area as he has a prosthesis on the other leg 1/21; again the same small area is 2 weeks ago. We have been using silver collagen I change that to endoform today I really believe that this is an offloading issue Electronic Signature(s) Signed: 01/17/2020 5:54:58 PM By: Linton Ham MD Entered By: Linton Ham on 01/17/2020 13:50:06 -------------------------------------------------------------------------------- Physical Exam Details Patient Name: Date of Service: CONCEPCION, KIRKPATRICK 01/17/2020 12:45 PM Medical Record [id number] Patient Account Number: [id number] Date of Birth/Sex: Treating RN: July 02, 1951 (69 y.o. M) Primary Care Provider: [name] Other Clinician: Referring Provider: Treating Provider/Extender:Marcelino Campos, Jeronimo Greaves, Buena Irish in Treatment: 31 Constitutional Sitting or standing Blood Pressure is within target range for patient.. Pulse regular and within target range for patient.Marland Kitchen Respirations regular, non-labored and within target range.. Temperature is normal and within the target range for the patient.Marland Kitchen Appears in no distress. Notes Wound exam; thick nonviable skin and subcutaneous tissue over the wound. This did not close over. Using a #3 curette debridement of the small surface area however there is considerable amount of subcutaneous debris. Post debridement the wound base does not look unhealthy however this is not close then at all. There is no palpable bone and no gross infection Electronic Signature(s) Signed: 01/17/2020 5:54:58 PM By: Linton Ham MD Entered By: Linton Ham on 01/17/2020  13:51:00 -------------------------------------------------------------------------------- Physician Orders Details Patient Name: Date of Service: MONTIE, SWIDERSKI 01/17/2020 12:45 PM Medical Record [id number] Patient Account Number: [id number] Date of Birth/Sex: Treating RN: [birth year] (69 y.o. 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 Dressing Change Frequency Wound #41 Right Metatarsal head second Change Dressing every other day. Skin Barriers/Peri-Wound Care Barrier cream - as needed for maceration, wetness, or redness. Wound Cleansing May shower and wash wound with soap and water. Primary Wound Dressing Wound #41 Right Metatarsal head second Endoform - moisten with saline or hydrogel. Secondary Dressing Wound #41 Right Metatarsal head second Kerlix/Rolled Gauze Dry Gauze Foam Border - patient may use bordered foam to cut into a felt to offload. Other: - felt to periwound and place one below wound. lightly wrap coban to hold dressing in place. felt / pad wound Edema Control Avoid standing for long periods of time Elevate legs to the level of the heart or above for 30 minutes daily and/or when sitting, a frequency of: - throughout the day. Off-Loading Other: - felt to periwound and place one below wound. Additional Orders / Instructions Other: - minimize walking and standing on foot to aid in wound healing and callus buildup. Electronic Signature(s) Signed:  01/17/2020 5:54:58 PM By: Linton Ham MD Signed: 01/17/2020 6:20:01 PM By: Deon Pilling Entered By: Deon Pilling on 01/17/2020 13:44:45 -------------------------------------------------------------------------------- Problem List Details Patient Name: Date of Service: LATROY, GAYMON 01/17/2020 12:45 PM Medical Record [id number] Patient Account Number: [id number] Date of Birth/Sex: Treating RN: [birth year] (69 y.o. 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, 06/12/2019 No Yes other extremity L03.115 Cellulitis of right lower limb 11/15/2019 No Yes Inactive Problems ICD-10 Code Description Active Date Inactive Date L03.114 Cellulitis of left upper limb 06/12/2019 06/12/2019 L98.498 Non-pressure chronic ulcer of skin of other sites with other 06/12/2019 06/12/2019 specified severity S61.209D Unspecified open wound of unspecified finger without damage 06/12/2019 06/12/2019 to nail, subsequent encounter Resolved Problems Electronic Signature(s) Signed: 01/17/2020 5:54:58 PM By: Linton Ham MD Entered By: Linton Ham on 01/17/2020 13:47:38 -------------------------------------------------------------------------------- Progress Note Details Patient Name: Date of Service: [name] 01/17/2020 12:45 PM Medical Record [id number] Patient Account Number: [id number] Date of Birth/Sex: Treating RN: September 26, 1951 (69 y.o. M) Primary Care Provider: [name] Other Clinician: Referring Provider: Treating  Provider/Extender:Lyon Dumont, Jeronimo Greaves, Buena Irish in Treatment: 31 Subjective History of Present Illness (HPI) The following HPI elements were documented for the patient's wound: Location: Patient presents with a wound to bilateral feet. Quality: Patient reports experiencing essentially no pain. Severity: Mildly severe wound with no evidence of infection Duration: Patient has had the wound for greater than 2 weeks prior to presenting for treatment The patient is a pleasant 69 yrs old bm here for evaluation of ulcers on the plantar aspect of both feet. 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 He has a left arm graft for access. He has been trying to stay off his  feet for weeks but does not seem to have any improvement in the wound. He has been seeing someone at the foot center and was referred to the Wound Care center for further evaluation. 12/30/15 the patient has 3 wounds one over the right first metatarsal head and 2 on the left foot at the left fifth and lleft first metatarsal head. All of these look relatively similar. The one over the left fifth probe to bone I could not prove that any of the others did. I note his MRI in November that did not show osteomyelitis. His peripheral pulses seem robust. All of these underwent surgical debridement to remove callus nonviable skin and subcutaneous tissue 01/06/16; the patient had his sutures removed from the right fifth ray amputation. There may be a small open part of this superiorly but otherwise the incision looks good. Areas over his right first, left first and left fifth metatarsal head all underwent surgical debridement as a varying degree of callus, skin and nonviable subcutaneous tissue. The area that is most worrisome is the right fifth metatarsal head which has a wound probes precariously close to bone. There is no purulent drainage or erythema 01/13/16; I'm not exactly sure of the  status of the right fifth ray amputation site however he follows with Dr. [name] later this week. The area over his right first plantar metatarsal head, left first and fifth plantar metatarsal head are all in the same status. Thick circumferential callus, nonviable subcutaneous tissue. Culture of the left fifth did not culture last week 01/20/16; all of the patient's wounds appear and roughly the same state although his amputation site on the right lateral foot looks better. His wounds over the right first, left first and left fifth metatarsal heads all underwent difficult surgical debridement removing circumferential callus nonviable skin and subcutaneous tissue. There is no overt evidence of infection in these areas. MRI at the end of December of the left foot did not show osteomyelitis, right foot showed osteomyelitis of the right fifth digit he is now status post amputation. 01/27/16 the patient's wounds over his plantar first and fifth metatarsal heads on the left all appear better having been started on a total contact cast last week. They were debridement of circumferential callus and nonviable subcutaneous tissue as was the wound over the first metatarsal head. His surgical incision on the right also had some light surface debridement done. 03/23/2016 -- the patient was doing really well and most of his wounds had almost completely healed but now he came back today with a history of having a discharge from the area of his right foot on the plantar aspect and also between his first and second toe. He also has had some discharge from the left foot. Addendum: I spoke to the PA [name] at the dialysis center whose fax number is [phone]. We discussed the infection the patient has and she will put the patient on vancomycin and Fortaz until the final culture report is back. We will fax this as soon as available. 03/30/2016 -- left foot x-ray IMPRESSION:No definitive osteomyelitis  noted. X-ray of the right foot -- IMPRESSION: 1. Soft tissue swelling. Prior amputation right fifth digit. No acute or focal bony abnormality identified. If osteomyelitis remains a clinical concern, MRI can be obtained. 2. Peripheral vascular disease. His culture reports have grown an MSSA -- and we will fax this report to his hemodialysis center. He was called in a prescription of oral doxycycline but I have told her  not to fill this in as he is already on IV antibiotics. 04/06/2016 -- a few days ago, I spoke to the hemodialysis center nurse who had stopped the IV antibiotics and he was given a prescription for doxycycline 100 mg by mouth twice a day for a week and he is on this at the present time. 05/11/2016 -- he has recently seen his PCP this week and his hemoglobin A1c was 7. He is working on his paperwork to get his orthotic shoes. 05/25/2016 -- -- x-ray of the left foot IMPRESSION:No acute bony abnormality. No radiographic changes of acute osteomyelitis. No change since prior study. X-ray of the right foot -- IMPRESSION: Postsurgical changes are seen involving the fifth toe. No evidence of acute osteomyelitis. he developed a large blister on the medial part of his right foot and this opened out and drain fluid. 06/01/2016 -- he has his MRI to be done this afternoon. 06/15/2016 -- MRI of the left forefoot without contrast shows 2 separate regions of cutaneous and subcutaneous edema and possible ulceration and blistering along the ball of the foot. No obvious osteomyelitis identified. MRI of the right foot showed cutaneous and subcutaneous thickening plantar to the first digit sesamoid with an ulcer crater but no underlying osteomyelitis is identified. 06/22/2016 -- the right foot plantar ulcer has been draining a lot of seropurulent material for the last few days. 06/30/2016 -- spoke to the dialysis center and I believe I spoke to [place] a PA at the center who discussed with me and  agreed to putting Legrand Como on vancomycin until his cultures arrive. On review of his culture report no WBCs were seen or no organisms were seen and the culture was reincubated for better growth. The final report is back and there were no predominant growth including Streptococcus or Staphylococcus. Clinically though he has a lot of drainage from both wounds a lot of undermining and there is further blebs on the left foot towards the interspace between his first and second toe. 08/10/2016 -- the culture from the right foot showed normal skin flora and there was no Staphylococcus aureus was group A streptococcus isolated. 09/21/2016 -- -- MRI of the right foot was done on 09/13/2016 - IMPRESSION: Findings most consistent with acute osteomyelitis throughout the great toe, sesamoid bones and plantar aspect of the head of the first metatarsal. Fluid in the sheath of the flexor tendon of the great toe could be sympathetic but is worrisome for septic tenosynovitis. First MTP joint effusion worrisome for septic joint. He was admitted to the hospital on 09/11/2016 and treated for a fever with vancomycin and cefepime. He was seen by Dr. Bobby Rumpf of infectious disease who recommended 6 weeks treatment with vancomycin and ceftazidime with his hemodialysis and to continue to see as in the wound clinic. Vascular consult was pending. The patient was discharged home on 09/14/2016 and he would continue with IV antibiotics for 6 weeks. 09/28/16 wound appears reasonably healthy. Continuing with total contact cast 10/05/16 wound is smaller and looking healthy. Continue with total contact cast. He continues on IV antibiotics 10/12/2016 -- he has developed a new wound on the dorsal aspect of his left big toe and this is a superficial injury with no surrounding cellulitis. He has completed 30 days of IV antibiotics and is now ready to start his hyperbaric oxygen therapy as per his insurance company's  recommendation. 10/19/2016 -- after the cast was removed on the right side he has got good resolution of his ulceration  on the right plantar foot. he has a new wound on the left plantar foot in the region of his fourth metatarsal and this will need sharp debridement. 10/26/2016 -- Xray of the right foot complete: IMPRESSION: Changes consistent with osteomyelitis involving the head of the first metatarsal and base of the first proximal phalanx. The sesamoid bones are also likely involved given their positioning. 11/02/2016 -- there still awaiting insurance clearance for his hyperbaric oxygen therapy and hopefully he will begin treatment soon. 11/09/2016 -- started with hyperbaric oxygen therapy and had some barotrauma to the right ear and this was seen by ENT who was prescribed Afrin drops and would probably continue with HBO and he is scheduled for myringotomy tubes on Friday 11/16/2016 -- he had his myringotomy tubes placed on Friday and has been doing much better after that with some fluid draining out after hyperbaric treatment today. The pain was much better. 11/30/2016 -- over the last 2 days he noticed a swelling and change of color of his right second toe and this had been draining minimal fluid. 12/07/2016 -- x-ray of the right foot -- IMPRESSION:1. Progressive ulceration at the distal aspect of the second digit with significant soft tissue swelling and osseous changes in the distal phalanx compatible with osteomyelitis. 2. Chronic osteomyelitis at the first MTP joint. 3. Ulcerations at the second and third toes as well without definite osseous changes. Osteomyelitis is not excluded. 4. Fifth digit amputation. On 12/01/2016 oo I spoke to Dr. Bobby Rumpf, the infectious disease specialist, who kindly agreed to treat this with IV antibiotics and he would call in the order to the dialysis center and this has been discussed in detail with the patient who will make the appropriate  arrangements. The patient will also book an appointment as soon as possible to see Dr. [name] in the office. Of note the patient has not been on antibiotics this entire week as the dialysis center did not receive any orders from Dr. [name]. I got in touch with Dr. [name] who tells me the patient has an appointment to see him this coming Wednesday. 12/14/2016 -- the patient is on ceftazidime and vancomycin during his dialysis, and I understand this was put on by his nephrologist Dr. Raliegh Ip possibly after speaking with infectious disease Dr. [name] 12/23/16; patient was on my schedule today for a wound evaluation as he had difficulties with his schedule earlier this week. I note that he is on vancomycin and ceftazidine at dialysis. In spite of this he arrives today with a new wound on the base of the right second toe this easily probes to bone. The known wound at the tip of the second toe With this area. He also has a superficial area on the medial aspect of the third toe on the side of the DIP. This does not appear to have much depth. The area on the plantar left foot is a deep area but did not probe the bone 12/28/16 -- he has brought in some lab work and the most recent labs done showed a hemoglobin of 12.1 hematocrit of 36.3, neutrophils of 51% WBC count of 5.6, BN of 53, albumin of 4.1 globulin of 3.7, vancomycin 13 g per mL. 01/04/2017 -- he saw Dr. [name] who has recommended a amputation of the right second toe and he is awaiting this date. He sees Dr. Bobby Rumpf of infectious disease tomorrow. The bone culture taken on 12/28/2016 had no growth in 2 days. 01/11/2017 -- was seen by Dr.  Bobby Rumpf regarding the management and has recommended a eval by vascular surgeons. He recommended to continue the antibiotics during his hemodialysis. Xray of the left foot -- IMPRESSION: No acute fracture, dislocation, or osseous erosion identified. 01/18/2017 -- he has his vascular workup later  today and he is going to have his right foot second toe amputation this coming Friday by Dr. [name]. We have also put in for a another 30 treatments with hyperbaric oxygen therapy 01/25/2017 -- I have reviewed Dr. Donnetta Hutching is vascular report from last week where he reviewed him and thought his vascular function was good enough to heal his amputation site and no further tests were recommended. He also had his orthopedic related to surgery which is still pending the notes and we will review these next week. 02/01/2017 -- the operative remote of Dr. 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 02/08/2017 --he was seen by Dr. [name] on 02/03/2017 -- patient is on [place]. after a thorough review he had recommended to continue with antibiotics during hemodialysis. The patient was seen by Dr. [name], but I do not find any follow-up note on the electronic medical record. he has removed the dressing over the right foot to remove the sutures and asked him to see him back in a month's time 02/22/2017 -- patient has been febrile and has been having symptoms of the upper respiratory tract infection but has not been checked for the flu and it's been over 4 days now. He says he is feeling better today. Some drainage between his left first and second toe and this needed to be looked at 03/08/2017 -- was seen by Dr. Bobby Rumpf on 03/07/2017 after review he will stop his antibiotics and see him back in a month to see if he is feeling well. 03/15/2017 -- he has completed his course of hyperbaric oxygen therapy and is doing fine with his health  otherwise. 03/22/2017 -- his nutritionist at the dialysis center has recommended a protein supplement to help build his collagen and we will prescribe this for him when he has the details 05/03/2017 -- he recently noticed the area on the plantar aspect of his fifth metatarsal head which opened out and had minimal drainage. 05/10/2017 -- -- x-ray of the left foot -- IMPRESSION:1. No convincing conventional radiographic evidence of active osteomyelitis.2. Active soft tissue ulceration at the tip of the fifth digit, and at the lateral aspect of the foot adjacent the base of the fifth metatarsal. 3. Surgical changes of prior fourth toe amputation. 4. 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 5. Small vessel atherosclerotic vascular calcifications. 6. 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 ======= Readmission after 5 weeks: 06/15/2017 -- the patient returns after 5 weeks having had an MRI done on 05/17/2017 MRI of the left foot without contrast showed soft tissue ulcer overlying the base of the fifth metatarsal. 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 No drainable fluid collection to suggest an abscess. He was in hospital between 05/16/2017 and 05/25/2017 -- and on discharge was asked to follow-up with Dr. [name] and  Dr. [name]. He was treated 2 weeks post discharge with vancomycin plus ceftriaxone with hemodialysis and oral metronidazole 500 mg 3 times a day. 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 last hemoglobin A1c was 6.7% He had a postoperative MR of the foot on 05/23/2017 -- which showed no new areas of cortical bone loss, edema or soft tissue ulceration to suggest osteomyelitis. the patient has completed his course of IV antibiotics during dialysis and is to see the infectious  disease doctor tomorrow. Dr. [name] had seen him after suture removal and asked him to keep the wound with a dry dressing 06/21/2017 -- he was seen by Dr. Bobby Rumpf of infectious disease on 06/16/2017 -- he stopped his Flagyl and will see him back in 6 weeks. He was asked to follow-up with Dr. [name] and with the wound clinic clinic. 07/05/17; bone biopsy from last time showed acute osteomyelitis. This is from the reminiscent in the left fifth metatarsal. Culture result is apparently still pending [holding for anaerobe}. From my understanding in this case this is been a progressive necrotic wound which is deteriorated markedly over the last 3 weeks since he returned here. He now has a large area of exposed bone which was biopsied and cultured last week. Dr. Graylon Good has put him on vancomycin and Fortaz during his hemodialysis and Flagyl orally. He is to see Dr. [name] next week 07/19/2017 -- the patient was reviewed by Dr. [name] of orthopedics who reviewed the case in detail and agreed with the plan to continue with IV antibiotics, aggressive wound care and hyperbaric oxygen therapy. He would see him back in 3 weeks' time 08/09/2017 -- saw Dr. Bobby Rumpf on 08/03/2017 -he was restarted on his antibiotics for 6 weeks which included vanco, ceftaz and Flagyl. he recommended continuing his antibiotics for 6 weeks and reevaluate his completion date. He would continue with wound care and hyperbaric oxygen therapy. 08/16/2017 -- I understand he will be completing his 6 weeks of antibiotics sometime later this week. 09/19/2017 -- he has been without his wound VAC for the last week due to lack of supplies. He has been packing his wound with silver alginate. 10/04/2017 -- he has an appointment with Dr. [name] tomorrow and hence we will not apply the wound VAC after his dressing changes today. 10/11/2017 -- he was seen by Dr. Bobby Rumpf on 10/05/2017, noted that the patient was currently  off antibiotics, and after thorough review he recommended he follow-up with the wound care as per plan and no further antibiotics at this point. 11/29/17; patient is arrived for the wound on his left lateral foot.Marland Kitchen He is completed IV antibiotics and 2 rounds of hyperbaric oxygen for treatment of underlying osteomyelitis. He arrives today with a surface on most of the wound area however our intake nurse noted drainage from the superior aspect. This was brought to my attention. 12/13/2017 -- the right plantar foot had a large bleb and once the bleb was opened out a large callus and subcutaneous debris was removed and he has a plantar ulcer near the fourth metatarsal head 12/28/17 on evaluation today patient appears to be doing acutely worse in regard to his left foot. The wound which has been appearing to do better as now open up more deeply there is bone palpable at the base of the wound unfortunately. He tells me that this feels "like it did when he had osteomyelitis previously" he  also noted that his second toe on the left foot appears to be doing worse and is swollen there does appear to be some fluid collected underneath. His right foot plantar ulcer appears to be doing somewhat better at this point and there really is no complication at the site currently. No fevers, chills, nausea, or vomiting noted at this time. Patient states that he normally has no pain at this site however. Today he is having significant pain. 01/03/18; culture done last week showed methicillin sensitive staph aureus and group B strep. He was on Septra however he arrives today with a fever of 101. He has a functional dialysis shunt in his left arm but has had no pain here. No cough. He still makes some urine no dysuria he did have abdominal pain nausea and diarrhea over the weekend but is not had any diarrhea since yesterday. Otherwise he has no specific complaints 01/05/18; the patient returns today in follow-up for his  presentation from 1/8. At that point he was febrile. I gave him some Levaquin adjusted for his dialysis status. He tells me the fever broke that night and it is not likely that this was actually a wound infection. He is gone on to have an MRI of the left foot; this showed interval development of an abnormal signal from the base of the third and fourth metatarsal and in the cuboid reminiscent consistent with osteomyelitis. There is no mention of the left second toe I think which was a concern when it was ordered. The patient is taking his Levaquin 01/10/18; the patient has completed his antibiotics today. The area on the left lateral foot is smaller but it still probes easily to bone. He has underlying osteomyelitis here and sees Dr. Megan Salon of infectious disease this week ooThe area on the right third plantar metatarsal head still requires debridement not much change in dimensions [facility] has a new wound on the medial tip of his right third toe again this probes to bone. He only noticed this 2 days ago 01/17/18; the patient saw Dr. [name] last week and he is back on Vanco and Fortaz at dialysis. 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 We are previously seeing him for a wound also on the base of the fourth med head on the right. X- ray of this area did not show osteomyelitis in relation to a new wound on the tip of his right third toe. Again this probes to bone. Finally his second toe on the left which didn't really show anything on the MRI at least no report appears to have a separated cutaneous area which I think is going to come right off the tip of his toe and leave exposed bone. Whether this is infectious or ischemic I am not clear Culture I did from the third toe last week which was his new wound was negative. Plain x-ray on the right foot did not show osteomyelitis in the  toes 01/24/18; the patient is going went to see Dr. [name]. He is going to have a amputation of the left second and right third toe although paradoxically the left second toe looks better than last week. We have treating a deep probing wound on the left lateral foot and the area over the third metatarsal head on the right foot. 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 Also  a debridement including bone of the left lateral foot and a closure which is still sutured. This is a bit surprising. Also apparent debridement of the base of the right third toe wound. 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 I'm not sure exactly where we are in the frame of things here. The wound on the left lateral foot is still sutured also the amputation sites of the left second and right third toe. He is been using Xeroform to the sutured areas silver alginate to the right foot 02/14/18; he continues on Rocephin at dialysis for osteomyelitis. I still don't have a good sense of where we are in the treatment duration. The wounds on the left lateral foot had the sutures removed and this clearly still probes deeply. We debrided the area with a #5 curet. We'll use silver alginate to the wound oothe area over the right third met head also required debridement of callus skin and subcutaneous tissue and necrotic debris over the wound surface. This tunnels superiorly but I did not unroofed this today. ooThe areas on the tip of his toes have a crusted surface eschar I did not debridement this either. 02/21/18; he continues on Rocephin at dialysis for osteomyelitis. I don't have a good sense of where we are and the treatment duration. The wounds on the left lateral foot is callused over on presentation  but requires debridement. The area on the right third med head plantar aspect actually is measuring smaller 02/28/18;patient is on Vanco and Fortaz at dialysis, I'm not sure why I thought this was Rocephin above unless it is been changed. I don't have a good sense of time frame here. Using silver collagen to the area on the left lateral foot and right third med head plantar foot 03/07/18; patient is completing bank and Fortaz at dialysis soon. He is using Silver collagen to the area on the left lateral foot and the right third metatarsal head. He has a smaller superficial wound distal to the left lateral foot wound. 03/14/18-he is here in follow-up evaluation for multiple ulcerations to his bilateral feet. He presents with a new ulceration to the plantar aspect of the left foot underneath the blister, this was deroofed to reveal a partial thickness ulcer. He is voicing no complaints or concerns, tolerated dialysis yesterday. We will continue with same treatment plan and he will follow-up next week 03/21/18; the patient has a area on the lateral left foot which still has a small probing area. The overall surface area of the wound is better. He presented with a new ulceration on the left foot plantar fifth metatarsal head last week. The area on the right third metatarsal head appears smaller.using silver alginate to all wound areas. The patient is changing the dressing himself. He is using a [place] forefoot offloading are on the right and a healing sandal on the left 03/28/18; original wound left lateral foot. Still nowhere close to looking like a heeling surface oosecondary wound on the left lateral foot at the base of his question fifth metatarsal which was new last week ooRight third metatarsal head. ooWe have been using silver alginate all wounds 04/04/18; the original wound on the left lateral foot again heavy callus surrounding thick nonviable subcutaneous tissue all requiring debridement. The new  wound from  2 weeks ago just near this at the base of the fifth metatarsal head still looks about the same ooUnfortunately there is been deterioration in the third medical head wound on the right which now probes to bone. I must say this was a superficial wound at one point in time and I really don't have a good frame of reference year. I'll have to go back to look through records about what we know about the right foot. 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 He is also been to see Dr. [name]. Somewhere in here as somebody is ordered a VASCULAR evaluation which was done on 03/22/18. On the right is anterior tibial artery was monophasic triphasic at the posterior tibial artery. On the left anterior tibial artery monophasic posterior tibial artery monophasic. ABI in the right was 1.43 on the left 1.21. TBIs on the right at 0.41 and on the left at 0.32. A vascular consult was recommended and I think has been arranged. 04/11/18; Mr. Suchy has not had an MRI of the right foot since 2017. Recent x-ray of the right foot done in January and February was negative. However he has had a major deterioration in the wound over the third met head. He is completed antibiotics last week at dialysis [place]. I think he is going to see vascular in follow-up. The areas on the left lateral foot and left plantar fifth metatarsal head both look satisfactory. I debrided both of these areas although the tissue here looks good. The area on the left lateral foot once probe to bone it certainly does not do that now 04/18/18; MRI of the right foot is on Thursday. He has what looks to be serosanguineous purulent drainage coming out of the wound over the right third metatarsal head today. He completed antibiotics bank and Fortaz 2 weeks ago at dialysis. He has a vascular follow-up with regards to his arterial  insufficiency although I don't exactly see when that is booked. The areas on the left foot including the lateral left foot and the plantar left fifth metatarsal head look about the same. 04/25/18-He is here in follow-up evaluation for bilateral foot ulcers. MRI obtained was negative for osteomyelitis. Wound culture was negative. We will continue with same treatment plan he'll follow-up next week 05/02/18; the right plantar foot wound over the third metatarsal head actually looks better than when I last saw this. His MRI was negative for osteomyelitis wound culture was negative. ooOn the left plantar foot both wounds on the plantar fifth metatarsal head and on the lateral foot both are covered in a very hard circumferential callus. 05/09/18; right plantar foot wound over the third metatarsal head stable from last week. ooLeft plantar foot wound over the fifth metatarsal head also stable but with callus around both wound areas ooThe area on the left lateral foot had thick callus over a surface and I had some thoughts about leaving this intact however it felt boggy. ooWe've been using silver alginate all wounds 05/16/18; since the patient was last here he was hospitalized from 5/15 through 5/19. 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 This grew MRSA. He was placed on vancomycin.MRI of the left foot suggested "progressive" osteomyelitis and bone destruction of the cuboid and the base of the fifth metatarsal. Stable erosive  changes of the base of the second metatarsal. I'm wondering if they're aware that he had surgery and debridement in the area of the underlying bone previously by Dr. [name]. In any case, He was also revascularized with an angioplasty and stenting of the left tibial peroneal trunk and angioplasty of the left posterior tibial artery. He is on vancomycin at dialysis. He has a  2 week follow-up with infectious disease. He has vascular surgery follow-up. He was started on Plavix. 05/23/18; the patient's wound on the lateral left foot at the level of the fifth metatarsal head and the plantar wound on the plantar fifth metatarsal head both look better. The area on the right third metatarsal head still has depth and undermining. We've been using silver alginate. He is on vancomycin. He has been revascularized on the left 05/30/18; he continues on vancomycin at dialysis. Revascularized on the left. The area on the left lateral foot is just about closed. Unfortunately the area over the plantar fifth metatarsal head undermining superiorly as does the area over the right third metatarsal head. Both of these significantly deteriorated from last week. He has appointments next week with infectious disease and orthopedics I delayed putting on a cast on the left until those appointments which therefore we made we'll bring him in on Friday the 14th with the idea of a cast on the left foot 06/06/18; he continues on vancomycin at dialysis. Revascularized on the left. He arrives today with a ballotable swelling just above the area on the left lateral foot where his previous wound was. He has no pain but he is reasonably insensate. The difficult areas on the plantar left fifth metatarsal head looks stable whereas the area on the right third plantar metatarsal head about the same as last week there is undermining here although I did not unroofed this today. He required a considerable debridement of the swelling on the left lateral foot area and I unroofed an abscess with a copious amount of brown purulent material which I obtained for culture. I don't believe during his recent hospitalization he had any further imaging although I need to review this. Previous cultures from this area done in this clinic showed MRSA, I would be surprised if this is not what this is currently even though he is on  vancomycin. 06/13/18; he continues on vancomycin at dialysis however he finishes this on Sunday and then graduates the doxycycline previously prescribed by Dr. [name]. The abscess site on the lateral foot that I unroofed last time grew moderate amounts of methicillin-resistant staph aureus that is both vancomycin and tetracycline sensitive so we should be okay from that regard. He arrives today in clinic with a connection between the abscess site and the area on the lateral foot. We've been using silver alginate to all his wound areas. The right third metatarsal head wound is measuring smaller. Using silver alginate on both wound areas 06/20/18; transitioning to doxycycline prescribed by Dr. [name]. The abscess site on the lateral foot that I unroofed 2 weeks ago grew MRSA. That area has largely closed down although the lateral part of his wound on the fifth metatarsal head still probes to bone. I suspect all of this was connected. ooOn the right third metatarsal head smaller looking wound but surrounded by nonviable tissue that once again requires debridement using silver alginate on both areas 06/27/18; on doxycycline 100 twice a day. The abscess on the lateral foot has closed down. Although he still has the wound on the left  fifth metatarsal head that extends towards the lateral part of the foot. The most lateral part of this wound probes to bone ooRight third metatarsal head still a deep probing wound with undermining. Nevertheless I elected not to debridement this this week ooIf everything is copious static next week on the left I'm going to attempt a total contact cast 07/04/18; he is tolerating his doxycycline. The abscess on the left lateral foot is closed down although there is still a deep wound here that probes to bone. We will use silver collagen under the total contact cast Silver collagen to the deep wound over the right third metatarsal head is well 07/11/18; he is tolerating  doxycycline. The left lateral fifth plantar metatarsal head still probes deeply but I could not probe any bone. We've been using silver collagen and this will be the second week under the total contact cast ooSilver collagen to deep wound over the right third metatarsal head as well 07/18/18; he is still taking doxycycline. The left lateral fifth plantar metatarsal this week probes deeply with a large amount of exposed bone. Quite a deterioration. He required extensive debridement. Specimens of bone for pathology and CNS obtained ooAlso considerable debridement on the right third metatarsal head 07/25/18; bone for pathology last week from the lateral left foot showed osteomyelitis. CandS of the bone Enterobacter. And Klebsiella. He is now on ciprofloxacin and the doxycycline is stopped [Dr. [name] of infectious disease] area He has an appointment with Dr. [name] tomorrow I'm going to leave the total contact cast off for this week. May wish to try to reapply that next week or the week after depending on the wound bed looks. I'm not sure if there is an operative option here, previously is followed with Dr. [name] 08/01/18 on evaluation today patient presents for reevaluation. He has been seen by infectious disease, Dr. [name], and he has placed the patient back on Ceftaz currently to be given to him at dialysis three times a week. 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 Fortunately there does not appear to be the evidence of infection at this point in time. Overall the patient has been tolerating the dressing changes without complication. We are no longer due lives in the cast of left foot secondary to the infection obviously. 08/10/18 on evaluation today patient appears to be doing rather well all things considered in regard to his left plantar foot ulcer. He does have a significant callous on the lateral portion of his left  foot he wonders if I can help clean that away to some degree today. Fortunately he is having no evidence of infection at this time which is good news. No fevers chills noted. With that being said his right plantar foot actually does have a significant callous buildup around the wound opening this seems to not be doing as well as I would like at this point. I do believe that he would benefit from sharp debridement at this site. Subsequently I think a total contact cast would be helpful for the right foot as well. 08/17/18; the patient arrived today with a total contact cast on the right foot. This actually looks quite good. He had gone to see Dr. Megan Salon of infectious disease about the osteomyelitis on the left foot. I think he is on IV Fortaz at dialysis although I am not exactly sure of the rationale for the Fortaz at the time of this dictation.  He also arrived in today with a swelling on the lateral left foot this is the site of his original wounds in fact when I first saw this man when he was under Dr. Ardeen Garland care I think this was the site of where the wound was located. It was not particularly tender however using a small scalpel I opened it to [place] some moderate amount of purulent drainage. 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 Last MRI was in May that showed progressive osteomyelitis and bone destruction of the cuboid and base of the fifth metatarsal. Last week he had an abscess over this same area this was removed. Culture was negative. ooWe are continuing with a total contact cast to the area on the third metatarsal head on the right and making some good progress here. The patient asked me again about renal transplant. He is not on the list because of the open wounds. 08/31/18; apparently the patient had an interruption in the [place] but he is now back on  this at dialysis. Apparently there was a misunderstanding and Dr. Crissie Figures orders. Due to have the MRI of the left foot tonight. ooThe area on the right foot continues to have callus thick skin and subcutaneous tissue around the wound edge that requires constant debridement however the wound is smaller we are using a total contact cast in this area. Alginate all wounds 09/07/18; without much surprise the MRI of his left foot showed osteomyelitis in the reminiscent of his fourth metatarsal but also the third metatarsal. She arrives in clinic today with the right foot and a total contact cast. There was purulent drainage coming out of the wound which I have cultured. Marked deterioration here with undermining widely around the wound orifice. Using pickups and a scalpel I remove callus and subcutaneous tissue from a substantial new opening. ooIn a similar fashion the area on the left lateral foot that was blistered last week I have opened this and remove skin and subcutaneous tissue from this area to expose a obvious new wound. I think there is extension and communication between all of this on the left foot. ooThe patient is on Fortaz at dialysis. Culture done of the right foot. We are clearly not making progress here. ooWe have made him an appointment with Dr. [name] of orthopedics. I think an amputation of the left leg may be discussed. I don't think there is anything that can be done with foot salvage. 09/14/18; culture from the right foot last time showed a very resistant MRSA. This is resistant to doxycycline. I'm going to try to get linezolid at least for a week. He does not have a appointment with Dr. [name] yet. 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 I'll send a message to Dr. [name] of infectious disease. I want the patient to see Dr. [name] to go over the pros and cons of an amputation which I think will be a BKA. The  patient had a question about whether this is curative or not. I told him that I thought it would be although spread of staph aureus infection is not unheard of. MRI of the right foot was done at the end of April 2019 did not show osteomyelitis. The patient last saw Dr. [name] on 9/3. I'll send Dr. [name] a message. I would  really like him to weigh the pros and cons of a BKA on the left otherwise he'll probably need IV antibiotics and perhaps hyperbaric oxygen again. He has not had a good response to this in the past. His MRI earlier this month showed progressive damage in the remnants of the fourth and third metatarsal 09/21/2018; sees Dr. [name] of orthopedics next week to discuss a left BKA in response to the osteomyelitis in the left foot. Using silver alginate to all wound areas. He is completing the Zyvox I did put in for him after last culture showed MRSA 09/29/2018; patient saw Dr. [name] of orthopedics discussed the osteomyelitis in the left foot third and fourth metatarsals. He did not recommend urgent surgery but certainly stated the only surgical option would be a BKA. He is communicating with Dr. [name]. The problem here is the instability of the areas on his foot which constantly generate draining abscesses. I will communicate with Dr. [name] about this. He has an appointment on 10/23 10/05/2018; patient sees Dr. [name] on 10/23. I have sent him a secure message not ordered any additional antibiotics for now. Patient continues to have 2 open areas on the left plantar foot at the fifth metatarsal head and the lateral aspect of the foot. These have not changed all that much. The area on the right third met head still has thick callus and surrounding subcutaneous tissue we have been using silver alginate 10/12/2018; patient sees Dr. [name] or colleague on 10/23. 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 The area on the  right plantar foot is just about closed. The superior wound on the left fifth metatarsal head is callused over but I am not sure if this is closed. The area below it is about the same. Considerable amount of callus on the lateral foot. We have been using silver alginate to the wounds 10/19/2018; the patient saw Dr. [name] on 10/22. He wishes to try and continue to save the left foot. He has been given 6 weeks of oral ciprofloxacin. We continue to put a total contact cast on the right foot. The area over the fifth metatarsal head on the left is callused over/perhaps healed but I did not remove the callus to find out. He still has the wound on the left lateral midfoot requiring debridement. We have been using silver alginate to all wound areas 10/26/2018 ooOn the left foot our intake nurse noted some purulent drainage from the inferior wound [currently the only one that is not callused over] ooOn the right foot even though he is in total contact cast a considerable amount of thick black callus and surface eschar. On this side we have been using silver alginate under a total contact cast. oohe remains on ciprofloxacin as prescribed by Dr. [name] 11/02/2018; culture from last week which is done of the probing area on the left midfoot wound showed MRSA "few". I am going to need to contact Dr. [name] which I will do today I think he is going to need IV antibiotics again. The area on the left foot which was callused on the side is clearly separating today all of this was removed a copious amounts of callus and necrotic subcutaneous tissue. ooThe area on the right plantar foot actually remained healthy looking with a healthy granulated base. ooWe are using silver alginate to all wound areas 11/09/2018; unfortunately neither 1 of the patient's wounds areas looks at all satisfactory. On  the left he has considerable necrotic debris from the plantar wound laterally over the foot. I removed copious  amounts of material including callus skin and subcutaneous tissue. On the right foot he arrives with undermining and frankly purulent drainage. Specimen obtained for culture and debridement of the callused skin and subcutaneous tissue from around the circumference. Because of this I cannot put him back in a total contact cast but to be truthful we are really unfortunately not making a lot of progress I did put in a secure text message to Dr. [name] wondering about the MRSA on the left foot. He suggested vancomycin although this is not started. I was left wondering if he expected me to send this into dialysis. Has we have more purulence on the right side wait for that result before calling dialysis 11/16/2018; I called dialysis earlier this week to get the vancomycin started. I think he got the first dose on Tuesday. The patient tells me that he fell earlier this week twisting his left foot and ankle and he is swelling. He continues to not look well. He had blood cultures done at dialysis on Tuesday he is not been informed of the results. 12/07/2018; the patient was admitted to hospital from 11/22/2018 through 12/02/2018. He underwent a left BKA. Apparently had staph sepsis. In the meantime being off the right foot this is closed over. He follows up with Dr. 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 Subsequent to this he actually had multiple falls in fact he tells me to following which calls the wound to be his apparently. He has been seeing Dr. [name] and his physician assistant in the interim. They actually did want him to come back for reevaluation to see if there's anything we can do for me wound care perspective the help this area to heal more appropriately. The patient states that he does not have a tremendous amount of pain which is good news. No fevers, chills, nausea, or  vomiting noted at this time. We have gotten approval from Dr. [name] office had [place] for [place] to treat the patient for his stop wound. This is due to the fact that the patient was in the 90 day postop global. 1/21; the patient does not have an open wound on the right foot although he does have some pressure areas that will need to be padded when he is transferring. The dehisced surgical wound looks clean although there is some undermining of 1.5 cm superiorly. 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 This appears healthy but there is undermining. TheraSkin #1 2/11; TheraSkin #2 2/25; TheraSkin #3. Wound is measuring smaller 3/10; TheraSkin #4 wound is measuring smaller 3/24; TheraSkin #5 wound is measuring smaller and looks healthy. 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 He has no open area on the right foot although he does have some callused area over the original third metatarsal head wound. The third metatarsal is also subluxed on the right foot. I have warned him today that he cannot consider wearing a prosthesis for at least a month but he can go for measurements. He is going to need to keep the right foot padded is much as possible in his diabetic shoe indefinitely READMISSION 06/12/2019 Mr. Geil is a type II diabetic on dialysis. He  has been in this clinic multiple times with wounds on his bilateral lower extremities. 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 He also has a history of wounds on his right foot but tells me that everything is going well here. He actually came in here with his prosthesis walking with a cane. We were all really quite gratified to see this He states over the last several weeks he has had a painful area at the tip of the left third  finger. His dialysis shunt in his is in the left upper arm and this particularly hurts during dialysis when his fingers get numb and there is a lot of pain in the tip of his left third finger. I believe he is seen vascular surgery. He had a Doppler done to evaluate for a dialysis steal syndrome. He is going for a banding procedure 1 week from today towards the left AV fistula. I think if that is unsuccessful they will be looking at creating a new shunt. He had a course of doxycycline when he which he finished about a week ago. He has been using Bactroban to the finger 6/25; the patient had his banding procedure and things seem to be going better. He is not having pain in his hand at dialysis. The area on the tip of his finger seems about closed. He did however have a paronychia I the last time I saw him. I have been advising him to use topical Bactroban washing the finger. Culture I did last time showed a few staph epidermidis which I was willing to dismiss as superficial skin contaminant. He arrives today with the finger wound looking better however the paronychia a seems worse 7/9; 2-week follow-up. He does not have an open wound on the tip of his third left finger. I thought he had an initial ischemic wound on the tip of the finger as well as a paronychia a medially. All of this appears to be closed. 8/6-Patient returns after 3 weeks for follow-up and has a right second met head plantar callus with a significant area of maceration hiding an ulcer ABI repeated today on right - 1.26 8/13-Patient returns at 1 week, the right second met head plantar wound appears to be slightly better, it is surrounded by the callus area we are using silver alginate 8/20; the patient came back to clinic 2 weeks ago with a wound on the right second metatarsal head. He apparently told me he developed callus in this area but also went away from his custom shoes to a pair of running shoes. Using silver alginate. He of  course has the left BKA and prosthesis on the left side. There might be much we can do to offload this although the patient tells me he has a wheelchair that he is using to try and stay off the wound is much as possible 8/27; right second metatarsal head. Still small punched out area with thick callus and subcutaneous tissue around the wound. We have been using silver collagen. This is always been an issue with this man's wound especially on this foot 9/3; right second met head. Still the same punched-out area with thick callus and subcutaneous tissue around the wound removing the circumference demonstrates repetitively undermining area. I have removed all the subcutaneous tissue associated with this. We have been using silver collagen 9/15; right second metatarsal head. Same punched-out  thick callus and subcutaneous tissue around the wound area. Still requiring debridement we have been using silver collagen I changed back to silver alginate X-ray last time showed no evidence of osteomyelitis. Culture showed Klebsiella and he was started and Keflex apparently just 4 days ago 9/24; right second metatarsal head. He is completed the antibiotics I gave him for 10 days. Same punched-out thick callus and subcutaneous tissue around the area. Still requiring debridement. We have been using silver alginate. The area itself looks swollen and this could be just [place] that comes on this area with walking on a prosthesis on the left however I think he needs an MRI and I am going to order that today. There is not an option to offload this further 10/1; right second plantar metatarsal head. MRI booked for October 6. Generally looking better today using silver alginate 10/8; right second plantar metatarsal head. MRI that was done on 10/6 was negative for osteomyelitis noted prior amputations of the second and fourth toes at the [facility]. Prior amputation of the third toe to the base of the middle phalanx. Prior  amputation of the fifth toe to the head of the metatarsal. 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 He does not recall about hearing about this. He did not have any pain but then again he has reduced sensation from diabetic neuropathy 10/15; right second plantar metatarsal head. Still in the same condition callus nonviable subcutaneous tissue which cleans up nicely with debridement but reforms by the next week. The patient tells me that he is offloading this is much as he can including taking his wheelchair to dialysis. We have been using silver alginate, changed to silver collagen today 10/22; right second plantar metatarsal head. Wound measures smaller but still thick callus around this wound. Still requiring debridement 11/5 right second plantar metatarsal head. Less callus around the wound circumference but still requiring debridement. There is still some undermining which I cleaned out the wound looks healthy but still considerable punched out depth with a relatively small circumference to the wound there is no palpable bone no purulent drainage 11/12; right second plantar metatarsal head. Small wound with thick callus tissue around the wound and significant undermining. We have been using silver collagen after my continuous debridements of this area 11/19; patient arrives in today with purulent drainage coming out of the wound in the second third met head area on the right foot. Serosanguineous thick drainage. Specimen obtained for culture. 11/21/2019 on evaluation today patient appears to be doing well with regard to his foot ulcer. The good news is is culture came back negative for any bacteria there was no growth. The other good news is his x-ray also appears to be doing well. The foot is also measuring much better than what it was. Overall I am very pleased with how things seem to be progressing. 12/22; patient was in [facility]  for several weeks due to the death of a family member. He said he stayed off the wound using silver alginate. 01/03/2020. Patient has a small open area with roughly 0.3 cm of circumferential undermining. The orifice looks smaller. We have been using silver collagen. There is not a wound more aggressive way to offload this area as he has a prosthesis on the other leg 1/21; again the same small area is 2 weeks ago. We have been using silver collagen I change that to endoform today I really believe that this is an  offloading issue Objective Constitutional Sitting or standing Blood Pressure is within target range for patient.. Pulse regular and within target range for patient.Marland Kitchen Respirations regular, non-labored and within target range.. Temperature is normal and within the target range for the patient.Marland Kitchen Appears in no distress. Vitals Time Taken: 1:20 PM, Height: 73 in, Weight: 202 lbs, BMI: 26.6, Temperature: 98.3 F, Pulse: 83 bpm, Respiratory Rate: 17 breaths/min, Blood Pressure: 110/68 mmHg, Capillary Blood Glucose: 148 mg/dl. General Notes: last CBG was 148 per patient General Notes: Wound exam; thick nonviable skin and subcutaneous tissue over the wound. This did not close over. Using a #3 curette debridement of the small surface area however there is considerable amount of subcutaneous debris. Post debridement the wound base does not look unhealthy however this is not close then at all. There is no palpable bone and no gross infection Integumentary (Hair, Skin) Wound #41 status is Open. Original cause of wound was Gradually Appeared. The wound is located on the Right Metatarsal head second. The wound measures 0.2cm length x 0.2cm width x 0.9cm depth; 0.031cm^2 area and 0.028cm^3 volume. There is Fat Layer (Subcutaneous Tissue) Exposed exposed. There is no tunneling noted, however, there is undermining starting at 12:00 and ending at 12:00 with a maximum distance of 1cm. There is a small  amount of serosanguineous drainage noted. The wound margin is well defined and not attached to the wound base. There is large (67-100%) pink, pale granulation within the wound bed. There is no necrotic tissue within the wound bed. 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 There was a Excisional Skin/Subcutaneous Tissue Debridement with a total area of 0.25 sq cm performed by Ricard Dillon., MD. With the following instrument(s): Curette to remove Viable and Non-Viable tissue/material. Material removed includes Callus, Subcutaneous Tissue, Skin: Dermis, and Fibrin/Exudate after achieving pain control using Lidocaine 4% Topical Solution. A time out was conducted at 13:38, prior to the start of the procedure. A Minimum amount of bleeding was controlled with Pressure. The procedure was tolerated well with a pain level of 0 throughout and a pain level of 0 following the procedure. Post Debridement Measurements: 0.2cm length x 0.2cm width x 0.9cm depth; 0.028cm^3 volume. Character of Wound/Ulcer Post Debridement is improved. Severity of Tissue Post Debridement is: Fat layer exposed. Post procedure Diagnosis Wound #41: Same as Pre-Procedure Plan Follow-up Appointments: Return Appointment in 2 weeks. Dressing Change Frequency: Wound #41 Right Metatarsal head second: Change Dressing every other day. Skin Barriers/Peri-Wound Care: Barrier cream - as needed for maceration, wetness, or redness. Wound Cleansing: May shower and wash wound with soap and water. Primary Wound  Dressing: Wound #41 Right Metatarsal head second: Endoform - moisten with saline or hydrogel. Secondary Dressing: Wound #41 Right Metatarsal head second: Kerlix/Rolled Gauze Dry Gauze Foam Border - patient may use bordered foam to cut into a felt to offload. Other: - felt to periwound and place one below wound. lightly wrap coban to hold dressing in place. felt / pad wound Edema Control: Avoid standing for long periods of time Elevate legs to the level of the heart or above for 30 minutes daily and/or when  sitting, a frequency of: - throughout the day. Off-Loading: Other: - felt to periwound and place one below wound. Additional Orders / Instructions: Other: - minimize walking and standing on foot to aid in wound healing and callus buildup. 1. Endoform to replace silver collagen 2. I do not know if there is another good dressing here if we can offload this. I had some thoughts about regrannex however I do not think the surface is adequate Electronic Signature(s) Signed: 01/17/2020 5:54:58 PM By: Linton Ham MD Entered By: Linton Ham on 01/17/2020 13:52:14 -------------------------------------------------------------------------------- SuperBill Details Patient Name: Date of Service: TASEAN, MANCHA 01/17/2020 Medical Record [id number] Patient Account Number: [id number] Date of Birth/Sex: Treating RN: 18-Nov-1951 (69 y.o. [name], [name] Primary Care Provider: [name] Other Clinician: Referring Provider: Treating Provider/Extender:Rasool Rommel, Jeronimo Greaves, Buena Irish in Treatment: 31 Diagnosis Coding ICD-10 Codes Code Description E11.621 Type 2 diabetes mellitus with foot ulcer L97.512 Non-pressure chronic ulcer of other part of right foot with fat layer exposed I70.298 Other atherosclerosis of native arteries of extremities, other extremity L03.115 Cellulitis of right lower limb Facility Procedures The patient participates with Medicare or their  insurance follows the Medicare Facility Guidelines: CPT4 Code Description Modifier Quantity [id number] 11042 - DEB SUBQ TISSUE 20 SQ CM/< 1 ICD-10 Diagnosis Description L97.512 Non-pressure chronic ulcer of  other part of right foot with fat layer exposed Physician Procedures CPT4 Code Description: [id number] 11042 - WC PHYS SUBQ TISS 20 SQ CM ICD-10 Diagnosis Description L97.512 Non-pressure chronic ulcer of other part of right foot wi Modifier: th fat layer e Quantity: 1 xposed Electronic Signature(s) Signed: 01/17/2020 5:54:58 PM By: Linton Ham MD Entered By: Linton Ham on 01/17/2020 13:52:28
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
## 2020-01-31 ENCOUNTER — Encounter (HOSPITAL_BASED_OUTPATIENT_CLINIC_OR_DEPARTMENT_OTHER): Payer: Medicare Other | Attending: Internal Medicine | Admitting: Internal Medicine

## 2020-01-31 ENCOUNTER — Other Ambulatory Visit: Payer: Self-pay

## 2020-01-31 DIAGNOSIS — E11621 Type 2 diabetes mellitus with foot ulcer: Secondary | ICD-10-CM | POA: Insufficient documentation

## 2020-01-31 DIAGNOSIS — L97512 Non-pressure chronic ulcer of other part of right foot with fat layer exposed: Secondary | ICD-10-CM | POA: Insufficient documentation

## 2020-01-31 NOTE — Progress Notes (Addendum)
[name], [name] ([id number]) Visit Report for 01/31/2020 Debridement Details Patient Name: Date of Service: [name], [name] 01/31/2020 12:45 PM Medical Record [id number] Patient Account Number: [id number] Date of Birth/Sex: January 17, 1951 (68 y.o. M) Treating RN: Primary Care Provider: [name] Other Clinician: Referring Provider: Treating Provider/Extender:Keishla Oyer, Birdena Crandall in Treatment: 33 Debridement Performed for Wound #41 Right Metatarsal head second Assessment: Performed By: Physician Ricard Dillon., MD Debridement Type: Debridement Severity of Tissue Pre Fat layer exposed Debridement: Level of Consciousness (Pre- Awake and Alert procedure): Pre-procedure Verification/Time Out Taken: Yes - 13:28 Start Time: 13:29 Pain Control: Lidocaine 4% Topical Solution Total Area Debrided (L x W): 1 (cm) x 1 (cm) = 1 (cm) Tissue and other material Viable, Non-Viable, Subcutaneous, Skin: Dermis , Skin: Epidermis, Fibrin/Exudate debrided: Level: Skin/Subcutaneous Tissue Debridement Description: Excisional Instrument: Curette Bleeding: Minimum Hemostasis Achieved: Pressure End Time: 13:33 Procedural Pain: 0 Post Procedural Pain: 0 Response to Treatment: Procedure was tolerated well Level of Consciousness Awake and Alert (Post-procedure): Post Debridement Measurements of Total Wound Length: (cm) 0.8 Width: (cm) 0.8 Depth: (cm) 1 Volume: (cm) 0.503 Character of Wound/Ulcer Post Improved Debridement: Severity of Tissue Post Debridement: Fat layer exposed Post Procedure Diagnosis Same as Pre-procedure Electronic Signature(s) Signed: 01/31/2020 5:50:02 PM By: Linton Ham MD Entered By: Linton Ham on 01/31/2020 15:02:59 -------------------------------------------------------------------------------- HPI Details Patient Name: Date of Service: [name] 01/31/2020 12:45 PM Medical Record [id number] Patient Account Number:  [id number] Date of Birth/Sex: Treating RN: 04/19/1951 (69 y.o. M) Primary Care Provider: [name] Other Clinician: Referring Provider: Treating Provider/Extender:Sihaam Chrobak, Jeronimo Greaves, Buena Irish in Treatment: 33 History of Present Illness Location: Patient presents with a wound to bilateral feet. Quality: Patient reports experiencing essentially no pain. Severity: Mildly severe wound with no evidence of infection Duration: Patient has had the wound for greater than 2 weeks prior to presenting for treatment HPI Description: The patient is a pleasant 69 yrs old bm here for evaluation of ulcers on the plantar aspect of both feet. 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 He has a left arm graft for access. He has been trying to stay off his feet for weeks but does not seem to have any improvement in the wound. He has been seeing someone at the foot center and was referred to the Wound Care center for further evaluation. 12/30/15 the patient has 3 wounds one over the right first metatarsal head and 2 on the left foot at the left fifth and lleft first metatarsal head. All of these look relatively similar. The one over the left fifth probe to bone I could not prove that any of the others did. I note his MRI in November that did not show osteomyelitis. His peripheral pulses seem robust. All of these underwent surgical debridement to remove callus nonviable skin and subcutaneous tissue 01/06/16; the patient had his sutures removed from the right fifth ray amputation. There may be a small open part of this superiorly but otherwise the incision looks good. Areas over his right first, left first and left fifth metatarsal head all underwent surgical debridement as a varying degree of callus, skin and nonviable subcutaneous tissue. The area that is most worrisome is the right fifth metatarsal head which has a wound probes precariously close to  bone. There is no purulent drainage or erythema 01/13/16; I'm not exactly sure of the status of the right fifth ray amputation site however he follows with Dr. [name] later  this week. The area over his right first plantar metatarsal head, left first and fifth plantar metatarsal head are all in the same status. Thick circumferential callus, nonviable subcutaneous tissue. Culture of the left fifth did not culture last week 01/20/16; all of the patient's wounds appear and roughly the same state although his amputation site on the right lateral foot looks better. His wounds over the right first, left first and left fifth metatarsal heads all underwent difficult surgical debridement removing circumferential callus nonviable skin and subcutaneous tissue. There is no overt evidence of infection in these areas. MRI at the end of December of the left foot did not show osteomyelitis, right foot showed osteomyelitis of the right fifth digit he is now status post amputation. 01/27/16 the patient's wounds over his plantar first and fifth metatarsal heads on the left all appear better having been started on a total contact cast last week. They were debridement of circumferential callus and nonviable subcutaneous tissue as was the wound over the first metatarsal head. His surgical incision on the right also had some light surface debridement done. 03/23/2016 -- the patient was doing really well and most of his wounds had almost completely healed but now he came back today with a history of having a discharge from the area of his right foot on the plantar aspect and also between his first and second toe. He also has had some discharge from the left foot. Addendum: I spoke to the PA [name] at the dialysis center whose fax number is [phone]. We discussed the infection the patient has and she will put the patient on vancomycin and Fortaz until the final culture report is back. We will fax this as soon  as available. 03/30/2016 -- left foot x-ray IMPRESSION:No definitive osteomyelitis noted. X-ray of the right foot -- IMPRESSION: 1. Soft tissue swelling. Prior amputation right fifth digit. No acute or focal bony abnormality identified. If osteomyelitis remains a clinical concern, MRI can be obtained. 2. Peripheral vascular disease. His culture reports have grown an MSSA -- and we will fax this report to his hemodialysis center. He was called in a prescription of oral doxycycline but I have told her not to fill this in as he is already on IV antibiotics. 04/06/2016 -- a few days ago, I spoke to the hemodialysis center nurse who had stopped the IV antibiotics and he was given a prescription for doxycycline 100 mg by mouth twice a day for a week and he is on this at the present time. 05/11/2016 -- he has recently seen his PCP this week and his hemoglobin A1c was 7. He is working on his paperwork to get his orthotic shoes. 05/25/2016 -- -- x-ray of the left foot IMPRESSION:No acute bony abnormality. No radiographic changes of acute osteomyelitis. No change since prior study. X-ray of the right foot -- IMPRESSION: Postsurgical changes are seen involving the fifth toe. No evidence of acute osteomyelitis. he developed a large blister on the medial part of his right foot and this opened out and drain fluid. 06/01/2016 -- he has his MRI to be done this afternoon. 06/15/2016 -- MRI of the left forefoot without contrast shows 2 separate regions of cutaneous and subcutaneous edema and possible ulceration and blistering along the ball of the foot. No obvious osteomyelitis identified. MRI of the right foot showed cutaneous and subcutaneous thickening plantar to the first digit sesamoid with an ulcer crater but no underlying osteomyelitis is identified. 06/22/2016 -- the right foot plantar  ulcer has been draining a lot of seropurulent material for the last few days. 06/30/2016 -- spoke to the dialysis  center and I believe I spoke to [place] a PA at the center who discussed with me and agreed to putting [name] on vancomycin until his cultures arrive. On review of his culture report no WBCs were seen or no organisms were seen and the culture was reincubated for better growth. The final report is back and there were no predominant growth including Streptococcus or Staphylococcus. Clinically though he has a lot of drainage from both wounds a lot of undermining and there is further blebs on the left foot towards the interspace between his first and second toe. 08/10/2016 -- the culture from the right foot showed normal skin flora and there was no Staphylococcus aureus was group A streptococcus isolated. 09/21/2016 -- -- MRI of the right foot was done on 09/13/2016 - IMPRESSION: Findings most consistent with acute osteomyelitis throughout the great toe, sesamoid bones and plantar aspect of the head of the first metatarsal. Fluid in the sheath of the flexor tendon of the great toe could be sympathetic but is worrisome for septic tenosynovitis. First MTP joint effusion worrisome for septic joint. He was admitted to the hospital on 09/11/2016 and treated for a fever with vancomycin and cefepime. He was seen by Dr. Bobby Rumpf of infectious disease who recommended 6 weeks treatment with vancomycin and ceftazidime with his hemodialysis and to continue to see as in the wound clinic. Vascular consult was pending. The patient was discharged home on 09/14/2016 and he would continue with IV antibiotics for 6 weeks. 09/28/16 wound appears reasonably healthy. Continuing with total contact cast 10/05/16 wound is smaller and looking healthy. Continue with total contact cast. He continues on IV antibiotics 10/12/2016 -- he has developed a new wound on the dorsal aspect of his left big toe and this is a superficial injury with no surrounding cellulitis. He has completed 30 days of IV antibiotics and is now  ready to start his hyperbaric oxygen therapy as per his insurance company's recommendation. 10/19/2016 -- after the cast was removed on the right side he has got good resolution of his ulceration on the right plantar foot. he has a new wound on the left plantar foot in the region of his fourth metatarsal and this will need sharp debridement. 10/26/2016 -- Xray of the right foot complete: IMPRESSION: Changes consistent with osteomyelitis involving the head of the first metatarsal and base of the first proximal phalanx. The sesamoid bones are also likely involved given their positioning. 11/02/2016 -- there still awaiting insurance clearance for his hyperbaric oxygen therapy and hopefully he will begin treatment soon. 11/09/2016 -- started with hyperbaric oxygen therapy and had some barotrauma to the right ear and this was seen by ENT who was prescribed Afrin drops and would probably continue with HBO and he is scheduled for myringotomy tubes on Friday 11/16/2016 -- he had his myringotomy tubes placed on Friday and has been doing much better after that with some fluid draining out after hyperbaric treatment today. The pain was much better. 11/30/2016 -- over the last 2 days he noticed a swelling and change of color of his right second toe and this had been draining minimal fluid. 12/07/2016 -- x-ray of the right foot -- IMPRESSION:1. Progressive ulceration at the distal aspect of the second digit with significant soft tissue swelling and osseous changes in the distal phalanx compatible with osteomyelitis. 2. Chronic osteomyelitis at the first  MTP joint. 3. Ulcerations at the second and third toes as well without definite osseous changes. Osteomyelitis is not excluded. 4. Fifth digit amputation. On 12/01/2016 I spoke to Dr. Bobby Rumpf, the infectious disease specialist, who kindly agreed to treat this with IV antibiotics and he would call in the order to the dialysis center and this has been  discussed in detail with the patient who will make the appropriate arrangements. The patient will also book an appointment as soon as possible to see Dr. [name] in the office. Of note the patient has not been on antibiotics this entire week as the dialysis center did not receive any orders from Dr. [name]. I got in touch with Dr. [name] who tells me the patient has an appointment to see him this coming Wednesday. 12/14/2016 -- the patient is on ceftazidime and vancomycin during his dialysis, and I understand this was put on by his nephrologist Dr. Raliegh Ip possibly after speaking with infectious disease Dr. [name] 12/23/16; patient was on my schedule today for a wound evaluation as he had difficulties with his schedule earlier this week. I note that he is on vancomycin and ceftazidine at dialysis. In spite of this he arrives today with a new wound on the base of the right second toe this easily probes to bone. The known wound at the tip of the second toe With this area. He also has a superficial area on the medial aspect of the third toe on the side of the DIP. This does not appear to have much depth. The area on the plantar left foot is a deep area but did not probe the bone 12/28/16 -- he has brought in some lab work and the most recent labs done showed a hemoglobin of 12.1 hematocrit of 36.3, neutrophils of 51% WBC count of 5.6, BN of 53, albumin of 4.1 globulin of 3.7, vancomycin 13 g per mL. 01/04/2017 -- he saw Dr. [name] who has recommended a amputation of the right second toe and he is awaiting this date. He sees Dr. Bobby Rumpf of infectious disease tomorrow. The bone culture taken on 12/28/2016 had no growth in 2 days. 01/11/2017 -- was seen by Dr. Bobby Rumpf regarding the management and has recommended a eval by vascular surgeons. He recommended to continue the antibiotics during his hemodialysis. Xray of the left foot -- IMPRESSION: No acute fracture, dislocation, or osseous  erosion identified. 01/18/2017 -- he has his vascular workup later today and he is going to have his right foot second toe amputation this coming Friday by Dr. [name]. We have also put in for a another 30 treatments with hyperbaric oxygen therapy 01/25/2017 -- I have reviewed Dr. Donnetta Hutching is vascular report from last week where he reviewed him and thought his vascular function was good enough to heal his amputation site and no further tests were recommended. He also had his orthopedic related to surgery which is still pending the notes and we will review these next week. 02/01/2017 -- the operative remote of Dr. 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 02/08/2017 --he was seen by Dr. [name] on 02/03/2017 -- patient is on ceftaz and Vanco. after a thorough review he  had recommended to continue with antibiotics during hemodialysis. The patient was seen by Dr. [name], but I do not find any follow-up note on the electronic medical record. he has removed the dressing over the right foot to remove the sutures and asked him to see him back in a month's time 02/22/2017 -- patient has been febrile and has been having symptoms of the upper respiratory tract infection but has not been checked for the flu and it's been over 4 days now. He says he is feeling better today. Some drainage between his left first and second toe and this needed to be looked at 03/08/2017 -- was seen by Dr. Bobby Rumpf on 03/07/2017 after review he will stop his antibiotics and see him back in a month to see if he is feeling well. 03/15/2017 -- he has completed his course of  hyperbaric oxygen therapy and is doing fine with his health otherwise. 03/22/2017 -- his nutritionist at the dialysis center has recommended a protein supplement to help build his collagen and we will prescribe this for him when he has the details 05/03/2017 -- he recently noticed the area on the plantar aspect of his fifth metatarsal head which opened out and had minimal drainage. 05/10/2017 -- -- x-ray of the left foot -- IMPRESSION:1. No convincing conventional radiographic evidence of active osteomyelitis.2. Active soft tissue ulceration at the tip of the fifth digit, and at the lateral aspect of the foot adjacent the base of the fifth metatarsal. 3. Surgical changes of prior fourth toe amputation. 4. 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 5. Small vessel atherosclerotic vascular calcifications. 6. 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 ======= Readmission after 5 weeks: 06/15/2017 -- the patient returns after 5 weeks having had an MRI done on 05/17/2017 MRI of the left foot without contrast showed soft tissue ulcer overlying the base of the fifth metatarsal. 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 No drainable fluid collection to suggest an abscess. He was in hospital between 05/16/2017 and 05/25/2017 -- and on discharge was asked to follow-up with Dr. [name] and Dr. [name]. He was treated 2 weeks post discharge with vancomycin plus ceftriaxone with hemodialysis and oral metronidazole 500 mg 3 times a day. 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 last hemoglobin A1c was 6.7% He had a postoperative MR of the foot on 05/23/2017 -- which showed no new areas of cortical bone loss, edema or soft tissue ulceration to suggest osteomyelitis. the patient has completed his course of  IV antibiotics during dialysis and is to see the infectious disease doctor tomorrow. Dr. [name] had seen him after suture removal and asked him to keep the wound with a dry dressing 06/21/2017 -- he was seen by Dr. Bobby Rumpf of infectious disease on 06/16/2017 -- he stopped his Flagyl and will see him back in 6 weeks. He was asked to follow-up with Dr. [name] and with the wound clinic clinic. 07/05/17; bone biopsy from last time showed acute osteomyelitis. This is from the reminiscent in the left fifth metatarsal. Culture result is apparently still pending [holding for anaerobe}. From my understanding in this case this is been a progressive necrotic wound which is deteriorated markedly over the last 3 weeks since he returned here. He now has a large area of exposed bone which was biopsied and  cultured last week. Dr. Graylon Good has put him on vancomycin and Fortaz during his hemodialysis and Flagyl orally. He is to see Dr. [name] next week 07/19/2017 -- the patient was reviewed by Dr. [name] of orthopedics who reviewed the case in detail and agreed with the plan to continue with IV antibiotics, aggressive wound care and hyperbaric oxygen therapy. He would see him back in 3 weeks' time 08/09/2017 -- saw Dr. Bobby Rumpf on 08/03/2017 -he was restarted on his antibiotics for 6 weeks which included vanco, ceftaz and Flagyl. he recommended continuing his antibiotics for 6 weeks and reevaluate his completion date. He would continue with wound care and hyperbaric oxygen therapy. 08/16/2017 -- I understand he will be completing his 6 weeks of antibiotics sometime later this week. 09/19/2017 -- he has been without his wound VAC for the last week due to lack of supplies. He has been packing his wound with silver alginate. 10/04/2017 -- he has an appointment with Dr. [name] tomorrow and hence we will not apply the wound VAC after his dressing changes today. 10/11/2017 -- he was seen by Dr. Bobby Rumpf on 10/05/2017, noted that the patient was currently off antibiotics, and after thorough review he recommended he follow-up with the wound care as per plan and no further antibiotics at this point. 11/29/17; patient is arrived for the wound on his left lateral foot.Marland Kitchen He is completed IV antibiotics and 2 rounds of hyperbaric oxygen for treatment of underlying osteomyelitis. He arrives today with a surface on most of the wound area however our intake nurse noted drainage from the superior aspect. This was brought to my attention. 12/13/2017 -- the right plantar foot had a large bleb and once the bleb was opened out a large callus and subcutaneous debris was removed and he has a plantar ulcer near the fourth metatarsal head 12/28/17 on evaluation today patient appears to be doing acutely worse in regard to his left foot. The wound which has been appearing to do better as now open up more deeply there is bone palpable at the base of the wound unfortunately. He tells me that this feels "like it did when he had osteomyelitis previously" he also noted that his second toe on the left foot appears to be doing worse and is swollen there does appear to be some fluid collected underneath. His right foot plantar ulcer appears to be doing somewhat better at this point and there really is no complication at the site currently. No fevers, chills, nausea, or vomiting noted at this time. Patient states that he normally has no pain at this site however. Today he is having significant pain. 01/03/18; culture done last week showed methicillin sensitive staph aureus and group B strep. He was on Septra however he arrives today with a fever of 101. He has a functional dialysis shunt in his left arm but has had no pain here. No cough. He still makes some urine no dysuria he did have abdominal pain nausea and diarrhea over the weekend but is not had any diarrhea since yesterday. Otherwise he has no specific  complaints 01/05/18; the patient returns today in follow-up for his presentation from 1/8. At that point he was febrile. I gave him some Levaquin adjusted for his dialysis status. He tells me the fever broke that night and it is not likely that this was actually a wound infection. He is gone on to have an MRI of the left foot; this showed interval development of an abnormal signal  from the base of the third and fourth metatarsal and in the cuboid reminiscent consistent with osteomyelitis. There is no mention of the left second toe I think which was a concern when it was ordered. The patient is taking his Levaquin 01/10/18; the patient has completed his antibiotics today. The area on the left lateral foot is smaller but it still probes easily to bone. He has underlying osteomyelitis here and sees Dr. Megan Salon of infectious disease this week The area on the right third plantar metatarsal head still requires debridement not much change in dimensions He has a new wound on the medial tip of his right third toe again this probes to bone. He only noticed this 2 days ago 01/17/18; the patient saw Dr. [name] last week and he is back on Vanco and Fortaz at dialysis. 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 We are previously seeing him for a wound also on the base of the fourth med head on the right. X- ray of this area did not show osteomyelitis in relation to a new wound on the tip of his right third toe. Again this probes to bone. Finally his second toe on the left which didn't really show anything on the MRI at least no report appears to have a separated cutaneous area which I think is going to come right off the tip of his toe and leave exposed bone. Whether this is infectious or ischemic I am not clear Culture I did from the third toe last week which was his new wound was negative. Plain  x-ray on the right foot did not show osteomyelitis in the toes 01/24/18; the patient is going went to see Dr. [name]. He is going to have a amputation of the left second and right third toe although paradoxically the left second toe looks better than last week. We have treating a deep probing wound on the left lateral foot and the area over the third metatarsal head on the right foot. 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 Also a debridement including bone of the left lateral foot and a closure which is still sutured. This is a bit surprising. Also apparent debridement of the base of the right third toe wound. 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 I'm not sure exactly where we are in the frame of things here. The wound on the left lateral foot is still sutured also the amputation sites of the left second and right third toe. He is been using Xeroform to the sutured areas silver alginate to the right foot 02/14/18; he continues on Rocephin at dialysis for osteomyelitis. I still don't have a good sense of where we are in the treatment duration. The wounds on the left lateral foot had the sutures removed and this clearly still probes deeply. We debrided the area with a #5 curet. We'll use silver alginate to the wound the area over the right third met head also required debridement of callus skin and subcutaneous tissue and necrotic debris over the wound surface. This tunnels superiorly but I did not unroofed this today. The areas on the tip of his toes  have a crusted surface eschar I did not debridement this either. 02/21/18; he continues on Rocephin at dialysis for osteomyelitis. I don't have a good sense of where we are and the treatment duration. The wounds on the  left lateral foot is callused over on presentation but requires debridement. The area on the right third med head plantar aspect actually is measuring smaller 02/28/18;patient is on Vanco and Fortaz at dialysis, I'm not sure why I thought this was Rocephin above unless it is been changed. I don't have a good sense of time frame here. Using silver collagen to the area on the left lateral foot and right third med head plantar foot 03/07/18; patient is completing bank and Fortaz at dialysis soon. He is using Silver collagen to the area on the left lateral foot and the right third metatarsal head. He has a smaller superficial wound distal to the left lateral foot wound. 03/14/18-he is here in follow-up evaluation for multiple ulcerations to his bilateral feet. He presents with a new ulceration to the plantar aspect of the left foot underneath the blister, this was deroofed to reveal a partial thickness ulcer. He is voicing no complaints or concerns, tolerated dialysis yesterday. We will continue with same treatment plan and he will follow-up next week 03/21/18; the patient has a area on the lateral left foot which still has a small probing area. The overall surface area of the wound is better. He presented with a new ulceration on the left foot plantar fifth metatarsal head last week. The area on the right third metatarsal head appears smaller.using silver alginate to all wound areas. The patient is changing the dressing himself. He is using a Darco forefoot offloading are on the right and a healing sandal on the left 03/28/18; original wound left lateral foot. Still nowhere close to looking like a heeling surface secondary wound on the left lateral foot at the base of his question fifth metatarsal which was new last week Right third metatarsal head. We have been using silver alginate all wounds 04/04/18; the original wound on the left lateral foot again heavy callus surrounding thick nonviable subcutaneous  tissue all requiring debridement. The new wound from 2 weeks ago just near this at the base of the fifth metatarsal head still looks about the same Unfortunately there is been deterioration in the third medical head wound on the right which now probes to bone. I must say this was a superficial wound at one point in time and I really don't have a good frame of reference year. I'll have to go back to look through records about what we know about the right foot. 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 He is also been to see Dr. [name]. Somewhere in here as somebody is ordered a VASCULAR evaluation which was done on 03/22/18. On the right is anterior tibial artery was monophasic triphasic at the posterior tibial artery. On the left anterior tibial artery monophasic posterior tibial artery monophasic. ABI in the right was 1.43 on the left 1.21. TBIs on the right at 0.41 and on the left at 0.32. A vascular consult was recommended and I think has been arranged. 04/11/18; Mr. Rawles has not had an MRI of the right foot since 2017. Recent x-ray of the right foot done in January and February was negative. However he has had a major deterioration in the wound over the third met head.  He is completed antibiotics last week at dialysis [place]. I think he is going to see vascular in follow-up. The areas on the left lateral foot and left plantar fifth metatarsal head both look satisfactory. I debrided both of these areas although the tissue here looks good. The area on the left lateral foot once probe to bone it certainly does not do that now 04/18/18; MRI of the right foot is on Thursday. He has what looks to be serosanguineous purulent drainage coming out of the wound over the right third metatarsal head today. He completed antibiotics bank and Fortaz 2 weeks ago at dialysis. He has a vascular follow-up  with regards to his arterial insufficiency although I don't exactly see when that is booked. The areas on the left foot including the lateral left foot and the plantar left fifth metatarsal head look about the same. 04/25/18-He is here in follow-up evaluation for bilateral foot ulcers. MRI obtained was negative for osteomyelitis. Wound culture was negative. We will continue with same treatment plan he'll follow-up next week 05/02/18; the right plantar foot wound over the third metatarsal head actually looks better than when I last saw this. His MRI was negative for osteomyelitis wound culture was negative. On the left plantar foot both wounds on the plantar fifth metatarsal head and on the lateral foot both are covered in a very hard circumferential callus. 05/09/18; right plantar foot wound over the third metatarsal head stable from last week. Left plantar foot wound over the fifth metatarsal head also stable but with callus around both wound areas The area on the left lateral foot had thick callus over a surface and I had some thoughts about leaving this intact however it felt boggy. We've been using silver alginate all wounds 05/16/18; since the patient was last here he was hospitalized from 5/15 through 5/19. 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 This grew MRSA. He was placed on vancomycin.MRI of the left foot suggested "progressive" osteomyelitis and bone destruction of the cuboid and the base of the fifth metatarsal. Stable erosive changes of the base of the second metatarsal. I'm wondering if they're aware that he had surgery and debridement in the area of the underlying bone previously by Dr. [name]. In any case, He was also revascularized with an angioplasty and stenting of the left tibial peroneal trunk and angioplasty of the left posterior tibial artery. He is on vancomycin at  dialysis. He has a 2 week follow-up with infectious disease. He has vascular surgery follow-up. He was started on Plavix. 05/23/18; the patient's wound on the lateral left foot at the level of the fifth metatarsal head and the plantar wound on the plantar fifth metatarsal head both look better. The area on the right third metatarsal head still has depth and undermining. We've been using silver alginate. He is on vancomycin. He has been revascularized on the left 05/30/18; he continues on vancomycin at dialysis. Revascularized on the left. The area on the left lateral foot is just about closed. Unfortunately the area over the plantar fifth metatarsal head undermining superiorly as does the area over the right third metatarsal head. Both of these significantly deteriorated from last week. He has appointments next week with infectious disease and orthopedics I delayed putting on a cast on the left until those appointments which therefore we made we'll bring him in on Friday the  14th with the idea of a cast on the left foot 06/06/18; he continues on vancomycin at dialysis. Revascularized on the left. He arrives today with a ballotable swelling just above the area on the left lateral foot where his previous wound was. He has no pain but he is reasonably insensate. The difficult areas on the plantar left fifth metatarsal head looks stable whereas the area on the right third plantar metatarsal head about the same as last week there is undermining here although I did not unroofed this today. He required a considerable debridement of the swelling on the left lateral foot area and I unroofed an abscess with a copious amount of brown purulent material which I obtained for culture. I don't believe during his recent hospitalization he had any further imaging although I need to review this. Previous cultures from this area done in this clinic showed MRSA, I would be surprised if this is not what this is currently  even though he is on vancomycin. 06/13/18; he continues on vancomycin at dialysis however he finishes this on Sunday and then graduates the doxycycline previously prescribed by Dr. [name]. The abscess site on the lateral foot that I unroofed last time grew moderate amounts of methicillin-resistant staph aureus that is both vancomycin and tetracycline sensitive so we should be okay from that regard. He arrives today in clinic with a connection between the abscess site and the area on the lateral foot. We've been using silver alginate to all his wound areas. The right third metatarsal head wound is measuring smaller. Using silver alginate on both wound areas 06/20/18; transitioning to doxycycline prescribed by Dr. [name]. The abscess site on the lateral foot that I unroofed 2 weeks ago grew MRSA. That area has largely closed down although the lateral part of his wound on the fifth metatarsal head still probes to bone. I suspect all of this was connected. On the right third metatarsal head smaller looking wound but surrounded by nonviable tissue that once again requires debridement using silver alginate on both areas 06/27/18; on doxycycline 100 twice a day. The abscess on the lateral foot has closed down. Although he still has the wound on the left fifth metatarsal head that extends towards the lateral part of the foot. The most lateral part of this wound probes to bone Right third metatarsal head still a deep probing wound with undermining. Nevertheless I elected not to debridement this this week If everything is copious static next week on the left I'm going to attempt a total contact cast 07/04/18; he is tolerating his doxycycline. The abscess on the left lateral foot is closed down although there is still a deep wound here that probes to bone. We will use silver collagen under the total contact cast Silver collagen to the deep wound over the right third metatarsal head is well 07/11/18; he is  tolerating doxycycline. The left lateral fifth plantar metatarsal head still probes deeply but I could not probe any bone. We've been using silver collagen and this will be the second week under the total contact cast Silver collagen to deep wound over the right third metatarsal head as well 07/18/18; he is still taking doxycycline. The left lateral fifth plantar metatarsal this week probes deeply with a large amount of exposed bone. Quite a deterioration. He required extensive debridement. Specimens of bone for pathology and CNS obtained Also considerable debridement on the right third metatarsal head 07/25/18; bone for pathology last week from the lateral left foot showed osteomyelitis.  CandS of the bone Enterobacter. And Klebsiella. He is now on ciprofloxacin and the doxycycline is stopped [Dr. [name] of infectious disease] area He has an appointment with Dr. [name] tomorrow I'm going to leave the total contact cast off for this week. May wish to try to reapply that next week or the week after depending on the wound bed looks. I'm not sure if there is an operative option here, previously is followed with Dr. [name] 08/01/18 on evaluation today patient presents for reevaluation. He has been seen by infectious disease, Dr. [name], and he has placed the patient back on Ceftaz currently to be given to him at dialysis three times a week. 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 Fortunately there does not appear to be the evidence of infection at this point in time. Overall the patient has been tolerating the dressing changes without complication. We are no longer due lives in the cast of left foot secondary to the infection obviously. 08/10/18 on evaluation today patient appears to be doing rather well all things considered in regard to his left plantar foot ulcer. He does have a significant callous on the lateral portion of his  left foot he wonders if I can help clean that away to some degree today. Fortunately he is having no evidence of infection at this time which is good news. No fevers chills noted. With that being said his right plantar foot actually does have a significant callous buildup around the wound opening this seems to not be doing as well as I would like at this point. I do believe that he would benefit from sharp debridement at this site. Subsequently I think a total contact cast would be helpful for the right foot as well. 08/17/18; the patient arrived today with a total contact cast on the right foot. This actually looks quite good. He had gone to see Dr. Megan Salon of infectious disease about the osteomyelitis on the left foot. I think he is on IV Fortaz at dialysis although I am not exactly sure of the rationale for the Fortaz at the time of this dictation. He also arrived in today with a swelling on the lateral left foot this is the site of his original wounds in fact when I first saw this man when he was under Dr. Ardeen Garland care I think this was the site of where the wound was located. It was not particularly tender however using a small scalpel I opened it to [place] some moderate amount of purulent drainage. 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 Last MRI was in May that showed progressive osteomyelitis and bone destruction of the cuboid and base of the fifth metatarsal. Last week he had an abscess over this same area this was removed. Culture was negative. We are continuing with a total contact cast to the area on the third metatarsal head on the right and making some good progress here. The patient asked me again about renal transplant. He is not on the list because of the open wounds. 08/31/18; apparently the patient had an interruption in the [place] but he is now back on  this at dialysis. Apparently there was a misunderstanding and Dr. Crissie Figures orders. Due to have the MRI of the left foot tonight. The area on the right foot continues  to have callus thick skin and subcutaneous tissue around the wound edge that requires constant debridement however the wound is smaller we are using a total contact cast in this area. Alginate all wounds 09/07/18; without much surprise the MRI of his left foot showed osteomyelitis in the reminiscent of his fourth metatarsal but also the third metatarsal. She arrives in clinic today with the right foot and a total contact cast. There was purulent drainage coming out of the wound which I have cultured. Marked deterioration here with undermining widely around the wound orifice. Using pickups and a scalpel I remove callus and subcutaneous tissue from a substantial new opening. In a similar fashion the area on the left lateral foot that was blistered last week I have opened this and remove skin and subcutaneous tissue from this area to expose a obvious new wound. I think there is extension and communication between all of this on the left foot. The patient is on Fortaz at dialysis. Culture done of the right foot. We are clearly not making progress here. We have made him an appointment with Dr. [name] of orthopedics. I think an amputation of the left leg may be discussed. I don't think there is anything that can be done with foot salvage. 09/14/18; culture from the right foot last time showed a very resistant MRSA. This is resistant to doxycycline. I'm going to try to get linezolid at least for a week. He does not have a appointment with Dr. [name] yet. 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 I'll send a message to Dr. [name] of infectious disease. I want the patient to see Dr. [name] to go over the pros and cons of an amputation which I think will be a BKA. The patient had a  question about whether this is curative or not. I told him that I thought it would be although spread of staph aureus infection is not unheard of. MRI of the right foot was done at the end of April 2019 did not show osteomyelitis. The patient last saw Dr. [name] on 9/3. I'll send Dr. [name] a message. I would really like him to weigh the pros and cons of a BKA on the left otherwise he'll probably need IV antibiotics and perhaps hyperbaric oxygen again. He has not had a good response to this in the past. His MRI earlier this month showed progressive damage in the remnants of the fourth and third metatarsal 09/21/2018; sees Dr. [name] of orthopedics next week to discuss a left BKA in response to the osteomyelitis in the left foot. Using silver alginate to all wound areas. He is completing the Zyvox I did put in for him after last culture showed MRSA 09/29/2018; patient saw Dr. [name] of orthopedics discussed the osteomyelitis in the left foot third and fourth metatarsals. He did not recommend urgent surgery but certainly stated the only surgical option would be a BKA. He is communicating with Dr. [name]. The problem here is the instability of the areas on his foot which constantly generate draining abscesses. I will communicate with Dr. [name] about this. He has an appointment on 10/23 10/05/2018; patient sees Dr. [name] on 10/23. I have sent him a secure message not ordered any additional antibiotics for now. Patient continues to have 2 open areas on the left plantar foot at the fifth metatarsal head and the lateral aspect of the foot. These have not changed all that much. The  area on the right third met head still has thick callus and surrounding subcutaneous tissue we have been using silver alginate 10/12/2018; patient sees Dr. [name] or colleague on 10/23. 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 The area on the right plantar  foot is just about closed. The superior wound on the left fifth metatarsal head is callused over but I am not sure if this is closed. The area below it is about the same. Considerable amount of callus on the lateral foot. We have been using silver alginate to the wounds 10/19/2018; the patient saw Dr. [name] on 10/22. He wishes to try and continue to save the left foot. He has been given 6 weeks of oral ciprofloxacin. We continue to put a total contact cast on the right foot. The area over the fifth metatarsal head on the left is callused over/perhaps healed but I did not remove the callus to find out. He still has the wound on the left lateral midfoot requiring debridement. We have been using silver alginate to all wound areas 10/26/2018 On the left foot our intake nurse noted some purulent drainage from the inferior wound [currently the only one that is not callused over] On the right foot even though he is in total contact cast a considerable amount of thick black callus and surface eschar. On this side we have been using silver alginate under a total contact cast. he remains on ciprofloxacin as prescribed by Dr. [name] 11/02/2018; culture from last week which is done of the probing area on the left midfoot wound showed MRSA "few". I am going to need to contact Dr. [name] which I will do today I think he is going to need IV antibiotics again. The area on the left foot which was callused on the side is clearly separating today all of this was removed a copious amounts of callus and necrotic subcutaneous tissue. The area on the right plantar foot actually remained healthy looking with a healthy granulated base. We are using silver alginate to all wound areas 11/09/2018; unfortunately neither 1 of the patient's wounds areas looks at all satisfactory. On the left he has considerable necrotic debris from the plantar wound laterally over the foot. I removed copious amounts of material including  callus skin and subcutaneous tissue. On the right foot he arrives with undermining and frankly purulent drainage. Specimen obtained for culture and debridement of the callused skin and subcutaneous tissue from around the circumference. Because of this I cannot put him back in a total contact cast but to be truthful we are really unfortunately not making a lot of progress I did put in a secure text message to Dr. [name] wondering about the MRSA on the left foot. He suggested vancomycin although this is not started. I was left wondering if he expected me to send this into dialysis. Has we have more purulence on the right side wait for that result before calling dialysis 11/16/2018; I called dialysis earlier this week to get the vancomycin started. I think he got the first dose on Tuesday. The patient tells me that he fell earlier this week twisting his left foot and ankle and he is swelling. He continues to not look well. He had blood cultures done at dialysis on Tuesday he is not been informed of the results. 12/07/2018; the patient was admitted to hospital from 11/22/2018 through 12/02/2018. He underwent a left BKA. Apparently had staph sepsis. In  the meantime being off the right foot this is closed over. He follows up with Dr. 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 Subsequent to this he actually had multiple falls in fact he tells me to following which calls the wound to be his apparently. He has been seeing Dr. [name] and his physician assistant in the interim. They actually did want him to come back for reevaluation to see if there's anything we can do for me wound care perspective the help this area to heal more appropriately. The patient states that he does not have a tremendous amount of pain which is good news. No fevers, chills, nausea, or vomiting noted at this time. We  have gotten approval from Dr. [name] office had [place] for [place] to treat the patient for his stop wound. This is due to the fact that the patient was in the 90 day postop global. 1/21; the patient does not have an open wound on the right foot although he does have some pressure areas that will need to be padded when he is transferring. The dehisced surgical wound looks clean although there is some undermining of 1.5 cm superiorly. 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 This appears healthy but there is undermining. TheraSkin #1 2/11; TheraSkin #2 2/25; TheraSkin #3. Wound is measuring smaller 3/10; TheraSkin #4 wound is measuring smaller 3/24; TheraSkin #5 wound is measuring smaller and looks healthy. 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 He has no open area on the right foot although he does have some callused area over the original third metatarsal head wound. The third metatarsal is also subluxed on the right foot. I have warned him today that he cannot consider wearing a prosthesis for at least a month but he can go for measurements. He is going to need to keep the right foot padded is much as possible in his diabetic shoe indefinitely READMISSION 06/12/2019 [name] is a type II diabetic on dialysis. He has been in this clinic multiple times with wounds on his bilateral lower extremities. 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 He also has a history of wounds on his right foot but tells me that everything is going well here. He actually came in here with his prosthesis walking with a cane. We were all really quite gratified to see this He states over the last several weeks he has had a painful area at the tip of the left third finger. His dialysis shunt in his is  in the left upper arm and this particularly hurts during dialysis when his fingers get numb and there is a lot of pain in the tip of his left third finger. I believe he is seen vascular surgery. He had a Doppler done to evaluate for a dialysis steal syndrome. He is going for a banding procedure 1 week from today towards the left AV fistula. I think if that is unsuccessful they will be looking at creating a new shunt. He had a course of doxycycline when he which he finished about a week ago. He has been using Bactroban to the finger 6/25; the patient had his banding procedure and things  seem to be going better. He is not having pain in his hand at dialysis. The area on the tip of his finger seems about closed. He did however have a paronychia I the last time I saw him. I have been advising him to use topical Bactroban washing the finger. Culture I did last time showed a few staph epidermidis which I was willing to dismiss as superficial skin contaminant. He arrives today with the finger wound looking better however the paronychia a seems worse 7/9; 2-week follow-up. He does not have an open wound on the tip of his third left finger. I thought he had an initial ischemic wound on the tip of the finger as well as a paronychia a medially. All of this appears to be closed. 8/6-Patient returns after 3 weeks for follow-up and has a right second met head plantar callus with a significant area of maceration hiding an ulcer ABI repeated today on right - 1.26 8/13-Patient returns at 1 week, the right second met head plantar wound appears to be slightly better, it is surrounded by the callus area we are using silver alginate 8/20; the patient came back to clinic 2 weeks ago with a wound on the right second metatarsal head. He apparently told me he developed callus in this area but also went away from his custom shoes to a pair of running shoes. Using silver alginate. He of course has the left BKA and  prosthesis on the left side. There might be much we can do to offload this although the patient tells me he has a wheelchair that he is using to try and stay off the wound is much as possible 8/27; right second metatarsal head. Still small punched out area with thick callus and subcutaneous tissue around the wound. We have been using silver collagen. This is always been an issue with this man's wound especially on this foot 9/3; right second met head. Still the same punched-out area with thick callus and subcutaneous tissue around the wound removing the circumference demonstrates repetitively undermining area. I have removed all the subcutaneous tissue associated with this. We have been using silver collagen 9/15; right second metatarsal head. Same punched-out thick callus and subcutaneous tissue around the wound area. Still requiring debridement we have been using silver collagen I changed back to silver alginate X-ray last time showed no evidence of osteomyelitis. Culture showed Klebsiella and he was started and Keflex apparently just 4 days ago 9/24; right second metatarsal head. He is completed the antibiotics I gave him for 10 days. Same punched-out thick callus and subcutaneous tissue around the area. Still requiring debridement. We have been using silver alginate. The area itself looks swollen and this could be just [place] that comes on this area with walking on a prosthesis on the left however I think he needs an MRI and I am going to order that today. There is not an option to offload this further 10/1; right second plantar metatarsal head. MRI booked for October 6. Generally looking better today using silver alginate 10/8; right second plantar metatarsal head. MRI that was done on 10/6 was negative for osteomyelitis noted prior amputations of the second and fourth toes at the MTP. Prior amputation of the third toe to the base of the middle phalanx. Prior amputation of the fifth toe  to the head of the metatarsal. 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 He does not recall about hearing about this. He  did not have any pain but then again he has reduced sensation from diabetic neuropathy 10/15; right second plantar metatarsal head. Still in the same condition callus nonviable subcutaneous tissue which cleans up nicely with debridement but reforms by the next week. The patient tells me that he is offloading this is much as he can including taking his wheelchair to dialysis. We have been using silver alginate, changed to silver collagen today 10/22; right second plantar metatarsal head. Wound measures smaller but still thick callus around this wound. Still requiring debridement 11/5 right second plantar metatarsal head. Less callus around the wound circumference but still requiring debridement. There is still some undermining which I cleaned out the wound looks healthy but still considerable punched out depth with a relatively small circumference to the wound there is no palpable bone no purulent drainage 11/12; right second plantar metatarsal head. Small wound with thick callus tissue around the wound and significant undermining. We have been using silver collagen after my continuous debridements of this area 11/19; patient arrives in today with purulent drainage coming out of the wound in the second third met head area on the right foot. Serosanguineous thick drainage. Specimen obtained for culture. 11/21/2019 on evaluation today patient appears to be doing well with regard to his foot ulcer. The good news is is culture came back negative for any bacteria there was no growth. The other good news is his x-ray also appears to be doing well. The foot is also measuring much better than what it was. Overall I am very pleased with how things seem to be progressing. 12/22; patient was in [facility] for several weeks due to the  death of a family member. He said he stayed off the wound using silver alginate. 01/03/2020. Patient has a small open area with roughly 0.3 cm of circumferential undermining. The orifice looks smaller. We have been using silver collagen. There is not a wound more aggressive way to offload this area as he has a prosthesis on the other leg 1/21; again the same small area is 2 weeks ago. We have been using silver collagen I change that to endoform today I really believe that this is an offloading issue 2/4; the area on the right third met head. We have been using endoform. Electronic Signature(s) Signed: 01/31/2020 5:50:02 PM By: Linton Ham MD Entered By: Linton Ham on 01/31/2020 14:57:32 -------------------------------------------------------------------------------- Physical Exam Details Patient Name: Date of Service: [name], [name] 01/31/2020 12:45 PM Medical Record [id number] Patient Account Number: [id number] Date of Birth/Sex: Treating RN: 12-28-1950 (69 y.o. M) Primary Care Provider: [name] Other Clinician: Referring Provider: Treating Provider/Extender:Lenni Reckner, Jeronimo Greaves, Buena Irish in Treatment: 33 Constitutional Patient is hypotensive. However he appears well. Pulse regular and within target range for patient.Marland Kitchen Respirations regular, non-labored and within target range.. Temperature is normal and within the target range for the patient.Marland Kitchen Appears in no distress. Notes Wound exam; thick nonviable callus, skin and subcutaneous tissue. Again requiring an aggressive debridement with a #5 curette. This is no better than 2 weeks ago. I am able to identify the wound healthy granulation but deep. It does not probe to bone there is no obvious infection. Electronic Signature(s) Signed: 01/31/2020 5:50:02 PM By: Linton Ham MD Entered By: Linton Ham on 01/31/2020  14:58:52 -------------------------------------------------------------------------------- Physician Orders Details Patient Name: Date of Service: [name], [name] 01/31/2020 12:45 PM Medical Record [id number] Patient Account Number: [id number] Date of Birth/Sex: Treating RN: [birth year] (69 y.o. 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 - Thursday Dressing Change Frequency Wound #41 Right Metatarsal head second Change Dressing every other day. Skin Barriers/Peri-Wound Care Barrier cream - as needed for maceration, wetness, or redness. Wound Cleansing May shower and wash wound with soap and water. Primary Wound Dressing Wound #41 Right Metatarsal head second Skin Substitute Application - wound center to [place]. Calcium Alginate with Silver - pack into wound bed. Secondary Dressing Wound #41 Right Metatarsal head second Foam - foam donut Kerlix/Rolled Gauze Dry Gauze Other: - felt to periwound and place one below wound. lightly wrap coban to hold dressing in place. felt / pad wound Edema Control Avoid standing for long periods of time Elevate legs to the level of the heart or above for 30 minutes daily and/or when sitting, a frequency of: - throughout the day. Off-Loading Other: - felt to periwound and place one below wound. Additional Orders / Instructions Other: - minimize walking and standing on foot to aid in wound healing and callus  buildup. Electronic Signature(s) Signed: 01/31/2020 5:50:02 PM By: Linton Ham MD Signed: 01/31/2020 5:56:18 PM By: Deon Pilling Entered By: Deon Pilling on 01/31/2020 13:38:59 -------------------------------------------------------------------------------- Problem List Details Patient Name: Date of Service: [name], [name] 01/31/2020 12:45 PM Medical Record [id number] Patient Account Number: [id number] Date of Birth/Sex: Treating RN: [birth year] (69 y.o. 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, 06/12/2019 No Yes other extremity L03.115 Cellulitis of right lower limb 11/15/2019 No Yes Inactive Problems ICD-10 Code Description Active Date Inactive Date L03.114 Cellulitis of left upper limb 06/12/2019 06/12/2019 L98.498 Non-pressure chronic ulcer of skin of other sites with other 06/12/2019 06/12/2019 specified severity S61.209D Unspecified open wound of unspecified finger without damage 06/12/2019 06/12/2019 to nail, subsequent encounter Resolved Problems Electronic Signature(s) Signed: 01/31/2020 5:50:02 PM By: Linton Ham MD Entered By: Linton Ham on 01/31/2020 14:56:10 -------------------------------------------------------------------------------- Progress Note Details Patient Name: Date of Service: [name] 01/31/2020 12:45 PM Medical Record [id number] Patient Account Number: [id number] Date of Birth/Sex: Treating RN: 08-19-1951 (69 y.o. M) Primary Care Provider: [name] Other  Clinician: Referring Provider: Treating Provider/Extender:Daielle Melcher, Jeronimo Greaves, Buena Irish in Treatment: 33 Subjective History of Present Illness (HPI) The following HPI elements were documented for the patient's wound: Location: Patient presents with a wound to bilateral feet. Quality: Patient reports experiencing essentially no pain. Severity: Mildly severe wound with no evidence of infection Duration: Patient has had the wound for greater than 2 weeks prior to presenting for treatment The patient is a pleasant 69 yrs old bm here for evaluation of ulcers on the plantar aspect of both feet. 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 He has a left arm graft for access. He has been trying to stay  off his feet for weeks but does not seem to have any improvement in the wound. He has been seeing someone at the foot center and was referred to the Wound Care center for further evaluation. 12/30/15 the patient has 3 wounds one over the right first metatarsal head and 2 on the left foot at the left fifth and lleft first metatarsal head. All of these look relatively similar. The one over the left fifth probe to bone I could not prove that any of the others did. I note his MRI in November that did not show osteomyelitis. His peripheral pulses seem robust. All of these underwent surgical debridement to remove callus nonviable skin and subcutaneous tissue 01/06/16; the patient had his sutures removed from the right fifth ray amputation. There may be a small open part of this superiorly but otherwise the incision looks good. Areas over his right first, left first and left fifth metatarsal head all underwent surgical debridement as a varying degree of callus, skin and nonviable subcutaneous tissue. The area that is most worrisome is the right fifth metatarsal head which has a wound probes precariously close to bone. There is no purulent drainage or  erythema 01/13/16; I'm not exactly sure of the status of the right fifth ray amputation site however he follows with Dr. [name] later this week. The area over his right first plantar metatarsal head, left first and fifth plantar metatarsal head are all in the same status. Thick circumferential callus, nonviable subcutaneous tissue. Culture of the left fifth did not culture last week 01/20/16; all of the patient's wounds appear and roughly the same state although his amputation site on the right lateral foot looks better. His wounds over the right first, left first and left fifth metatarsal heads all underwent difficult surgical debridement removing circumferential callus nonviable skin and subcutaneous tissue. There is no overt evidence of infection in these areas. MRI at the end of December of the left foot did not show osteomyelitis, right foot showed osteomyelitis of the right fifth digit he is now status post amputation. 01/27/16 the patient's wounds over his plantar first and fifth metatarsal heads on the left all appear better having been started on a total contact cast last week. They were debridement of circumferential callus and nonviable subcutaneous tissue as was the wound over the first metatarsal head. His surgical incision on the right also had some light surface debridement done. 03/23/2016 -- the patient was doing really well and most of his wounds had almost completely healed but now he came back today with a history of having a discharge from the area of his right foot on the plantar aspect and also between his first and second toe. He also has had some discharge from the left foot. Addendum: I spoke to the PA [name] at the dialysis center whose fax number is [phone]. We discussed the infection the patient has and she will put the patient on vancomycin and Fortaz until the final culture report is back. We will fax this as soon as available. 03/30/2016 -- left foot  x-ray IMPRESSION:No definitive osteomyelitis noted. X-ray of the right foot -- IMPRESSION: 1. Soft tissue swelling. Prior amputation right fifth digit. No acute or focal bony abnormality identified. If osteomyelitis remains a clinical concern, MRI can be obtained. 2. Peripheral vascular disease. His culture reports have grown an MSSA -- and we will fax this report to his hemodialysis center. He was called in a prescription of oral doxycycline but I have  told her not to fill this in as he is already on IV antibiotics. 04/06/2016 -- a few days ago, I spoke to the hemodialysis center nurse who had stopped the IV antibiotics and he was given a prescription for doxycycline 100 mg by mouth twice a day for a week and he is on this at the present time. 05/11/2016 -- he has recently seen his PCP this week and his hemoglobin A1c was 7. He is working on his paperwork to get his orthotic shoes. 05/25/2016 -- -- x-ray of the left foot IMPRESSION:No acute bony abnormality. No radiographic changes of acute osteomyelitis. No change since prior study. X-ray of the right foot -- IMPRESSION: Postsurgical changes are seen involving the fifth toe. No evidence of acute osteomyelitis. he developed a large blister on the medial part of his right foot and this opened out and drain fluid. 06/01/2016 -- he has his MRI to be done this afternoon. 06/15/2016 -- MRI of the left forefoot without contrast shows 2 separate regions of cutaneous and subcutaneous edema and possible ulceration and blistering along the ball of the foot. No obvious osteomyelitis identified. MRI of the right foot showed cutaneous and subcutaneous thickening plantar to the first digit sesamoid with an ulcer crater but no underlying osteomyelitis is identified. 06/22/2016 -- the right foot plantar ulcer has been draining a lot of seropurulent material for the last few days. 06/30/2016 -- spoke to the dialysis center and I believe I spoke to [place] a PA  at the center who discussed with me and agreed to putting [name] on vancomycin until his cultures arrive. On review of his culture report no WBCs were seen or no organisms were seen and the culture was reincubated for better growth. The final report is back and there were no predominant growth including Streptococcus or Staphylococcus. Clinically though he has a lot of drainage from both wounds a lot of undermining and there is further blebs on the left foot towards the interspace between his first and second toe. 08/10/2016 -- the culture from the right foot showed normal skin flora and there was no Staphylococcus aureus was group A streptococcus isolated. 09/21/2016 -- -- MRI of the right foot was done on 09/13/2016 - IMPRESSION: Findings most consistent with acute osteomyelitis throughout the great toe, sesamoid bones and plantar aspect of the head of the first metatarsal. Fluid in the sheath of the flexor tendon of the great toe could be sympathetic but is worrisome for septic tenosynovitis. First MTP joint effusion worrisome for septic joint. He was admitted to the hospital on 09/11/2016 and treated for a fever with vancomycin and cefepime. He was seen by Dr. Bobby Rumpf of infectious disease who recommended 6 weeks treatment with vancomycin and ceftazidime with his hemodialysis and to continue to see as in the wound clinic. Vascular consult was pending. The patient was discharged home on 09/14/2016 and he would continue with IV antibiotics for 6 weeks. 09/28/16 wound appears reasonably healthy. Continuing with total contact cast 10/05/16 wound is smaller and looking healthy. Continue with total contact cast. He continues on IV antibiotics 10/12/2016 -- he has developed a new wound on the dorsal aspect of his left big toe and this is a superficial injury with no surrounding cellulitis. He has completed 30 days of IV antibiotics and is now ready to start his hyperbaric oxygen therapy as  per his insurance company's recommendation. 10/19/2016 -- after the cast was removed on the right side he has got good resolution of  his ulceration on the right plantar foot. he has a new wound on the left plantar foot in the region of his fourth metatarsal and this will need sharp debridement. 10/26/2016 -- Xray of the right foot complete: IMPRESSION: Changes consistent with osteomyelitis involving the head of the first metatarsal and base of the first proximal phalanx. The sesamoid bones are also likely involved given their positioning. 11/02/2016 -- there still awaiting insurance clearance for his hyperbaric oxygen therapy and hopefully he will begin treatment soon. 11/09/2016 -- started with hyperbaric oxygen therapy and had some barotrauma to the right ear and this was seen by ENT who was prescribed Afrin drops and would probably continue with HBO and he is scheduled for myringotomy tubes on Friday 11/16/2016 -- he had his myringotomy tubes placed on Friday and has been doing much better after that with some fluid draining out after hyperbaric treatment today. The pain was much better. 11/30/2016 -- over the last 2 days he noticed a swelling and change of color of his right second toe and this had been draining minimal fluid. 12/07/2016 -- x-ray of the right foot -- IMPRESSION:1. Progressive ulceration at the distal aspect of the second digit with significant soft tissue swelling and osseous changes in the distal phalanx compatible with osteomyelitis. 2. Chronic osteomyelitis at the first MTP joint. 3. Ulcerations at the second and third toes as well without definite osseous changes. Osteomyelitis is not excluded. 4. Fifth digit amputation. On 12/01/2016 oo I spoke to Dr. Bobby Rumpf, the infectious disease specialist, who kindly agreed to treat this with IV antibiotics and he would call in the order to the dialysis center and this has been discussed in detail with the patient who  will make the appropriate arrangements. The patient will also book an appointment as soon as possible to see Dr. [name] in the office. Of note the patient has not been on antibiotics this entire week as the dialysis center did not receive any orders from Dr. [name]. I got in touch with Dr. [name] who tells me the patient has an appointment to see him this coming Wednesday. 12/14/2016 -- the patient is on ceftazidime and vancomycin during his dialysis, and I understand this was put on by his nephrologist Dr. Raliegh Ip possibly after speaking with infectious disease Dr. [name] 12/23/16; patient was on my schedule today for a wound evaluation as he had difficulties with his schedule earlier this week. I note that he is on vancomycin and ceftazidine at dialysis. In spite of this he arrives today with a new wound on the base of the right second toe this easily probes to bone. The known wound at the tip of the second toe With this area. He also has a superficial area on the medial aspect of the third toe on the side of the DIP. This does not appear to have much depth. The area on the plantar left foot is a deep area but did not probe the bone 12/28/16 -- he has brought in some lab work and the most recent labs done showed a hemoglobin of 12.1 hematocrit of 36.3, neutrophils of 51% WBC count of 5.6, BN of 53, albumin of 4.1 globulin of 3.7, vancomycin 13 g per mL. 01/04/2017 -- he saw Dr. [name] who has recommended a amputation of the right second toe and he is awaiting this date. He sees Dr. Bobby Rumpf of infectious disease tomorrow. The bone culture taken on 12/28/2016 had no growth in 2 days. 01/11/2017 -- was seen  by Dr. Bobby Rumpf regarding the management and has recommended a eval by vascular surgeons. He recommended to continue the antibiotics during his hemodialysis. Xray of the left foot -- IMPRESSION: No acute fracture, dislocation, or osseous erosion identified. 01/18/2017 -- he has his  vascular workup later today and he is going to have his right foot second toe amputation this coming Friday by Dr. [name]. We have also put in for a another 30 treatments with hyperbaric oxygen therapy 01/25/2017 -- I have reviewed Dr. Donnetta Hutching is vascular report from last week where he reviewed him and thought his vascular function was good enough to heal his amputation site and no further tests were recommended. He also had his orthopedic related to surgery which is still pending the notes and we will review these next week. 02/01/2017 -- the operative remote of Dr. 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 02/08/2017 --he was seen by Dr. [name] on 02/03/2017 -- patient is on [place]. after a thorough review he had recommended to continue with antibiotics during hemodialysis. The patient was seen by Dr. [name], but I do not find any follow-up note on the electronic medical record. he has removed the dressing over the right foot to remove the sutures and asked him to see him back in a month's time 02/22/2017 -- patient has been febrile and has been having symptoms of the upper respiratory tract infection but has not been checked for the flu and it's been over 4 days now. He says he is feeling better today. Some drainage between his left first and second toe and this needed to be looked at 03/08/2017 -- was seen by Dr. Bobby Rumpf on 03/07/2017 after review he will stop his antibiotics and see him back in a month to see if he is feeling well. 03/15/2017 -- he has completed his course of hyperbaric oxygen therapy and is doing fine  with his health otherwise. 03/22/2017 -- his nutritionist at the dialysis center has recommended a protein supplement to help build his collagen and we will prescribe this for him when he has the details 05/03/2017 -- he recently noticed the area on the plantar aspect of his fifth metatarsal head which opened out and had minimal drainage. 05/10/2017 -- -- x-ray of the left foot -- IMPRESSION:1. No convincing conventional radiographic evidence of active osteomyelitis.2. Active soft tissue ulceration at the tip of the fifth digit, and at the lateral aspect of the foot adjacent the base of the fifth metatarsal. 3. Surgical changes of prior fourth toe amputation. 4. 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 5. Small vessel atherosclerotic vascular calcifications. 6. 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 ======= Readmission after 5 weeks: 06/15/2017 -- the patient returns after 5 weeks having had an MRI done on 05/17/2017 MRI of the left foot without contrast showed soft tissue ulcer overlying the base of the fifth metatarsal. 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 No drainable fluid collection to suggest an abscess. He was in hospital between 05/16/2017 and 05/25/2017 -- and on discharge was asked to follow-up with Dr.  Graves and Dr. [name]. He was treated 2 weeks post discharge with vancomycin plus ceftriaxone with hemodialysis and oral metronidazole 500 mg 3 times a day. 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 last hemoglobin A1c was 6.7% He had a postoperative MR of the foot on 05/23/2017 -- which showed no new areas of cortical bone loss, edema or soft tissue ulceration to suggest osteomyelitis. the patient has completed his course of IV antibiotics during dialysis and is to see  the infectious disease doctor tomorrow. Dr. [name] had seen him after suture removal and asked him to keep the wound with a dry dressing 06/21/2017 -- he was seen by Dr. Bobby Rumpf of infectious disease on 06/16/2017 -- he stopped his Flagyl and will see him back in 6 weeks. He was asked to follow-up with Dr. [name] and with the wound clinic clinic. 07/05/17; bone biopsy from last time showed acute osteomyelitis. This is from the reminiscent in the left fifth metatarsal. Culture result is apparently still pending [holding for anaerobe}. From my understanding in this case this is been a progressive necrotic wound which is deteriorated markedly over the last 3 weeks since he returned here. He now has a large area of exposed bone which was biopsied and cultured last week. Dr. Graylon Good has put him on vancomycin and Fortaz during his hemodialysis and Flagyl orally. He is to see Dr. [name] next week 07/19/2017 -- the patient was reviewed by Dr. [name] of orthopedics who reviewed the case in detail and agreed with the plan to continue with IV antibiotics, aggressive wound care and hyperbaric oxygen therapy. He would see him back in 3 weeks' time 08/09/2017 -- saw Dr. Bobby Rumpf on 08/03/2017 -he was restarted on his antibiotics for 6 weeks which included vanco, ceftaz and Flagyl. he recommended continuing his antibiotics for 6 weeks and reevaluate his completion date. He would continue with wound care and hyperbaric oxygen therapy. 08/16/2017 -- I understand he will be completing his 6 weeks of antibiotics sometime later this week. 09/19/2017 -- he has been without his wound VAC for the last week due to lack of supplies. He has been packing his wound with silver alginate. 10/04/2017 -- he has an appointment with Dr. [name] tomorrow and hence we will not apply the wound VAC after his dressing changes today. 10/11/2017 -- he was seen by Dr. Bobby Rumpf on 10/05/2017, noted that the patient  was currently off antibiotics, and after thorough review he recommended he follow-up with the wound care as per plan and no further antibiotics at this point. 11/29/17; patient is arrived for the wound on his left lateral foot.Marland Kitchen He is completed IV antibiotics and 2 rounds of hyperbaric oxygen for treatment of underlying osteomyelitis. He arrives today with a surface on most of the wound area however our intake nurse noted drainage from the superior aspect. This was brought to my attention. 12/13/2017 -- the right plantar foot had a large bleb and once the bleb was opened out a large callus and subcutaneous debris was removed and he has a plantar ulcer near the fourth metatarsal head 12/28/17 on evaluation today patient appears to be doing acutely worse in regard to his left foot. The wound which has been appearing to do better as now open up more deeply there is bone palpable at the base of the wound unfortunately. He tells me that this feels "like it did when he had osteomyelitis  previously" he also noted that his second toe on the left foot appears to be doing worse and is swollen there does appear to be some fluid collected underneath. His right foot plantar ulcer appears to be doing somewhat better at this point and there really is no complication at the site currently. No fevers, chills, nausea, or vomiting noted at this time. Patient states that he normally has no pain at this site however. Today he is having significant pain. 01/03/18; culture done last week showed methicillin sensitive staph aureus and group B strep. He was on [place] however he arrives today with a fever of 101. He has a functional dialysis shunt in his left arm but has had no pain here. No cough. He still makes some urine no dysuria he did have abdominal pain nausea and diarrhea over the weekend but is not had any diarrhea since yesterday. Otherwise he has no specific complaints 01/05/18; the patient returns today in follow-up  for his presentation from 1/8. At that point he was febrile. I gave him some Levaquin adjusted for his dialysis status. He tells me the fever broke that night and it is not likely that this was actually a wound infection. He is gone on to have an MRI of the left foot; this showed interval development of an abnormal signal from the base of the third and fourth metatarsal and in the cuboid reminiscent consistent with osteomyelitis. There is no mention of the left second toe I think which was a concern when it was ordered. The patient is taking his Levaquin 01/10/18; the patient has completed his antibiotics today. The area on the left lateral foot is smaller but it still probes easily to bone. He has underlying osteomyelitis here and sees Dr. Megan Salon of infectious disease this week ooThe area on the right third plantar metatarsal head still requires debridement not much change in dimensions [facility] has a new wound on the medial tip of his right third toe again this probes to bone. He only noticed this 2 days ago 01/17/18; the patient saw Dr. [name] last week and he is back on Vanco and Fortaz at dialysis. 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 We are previously seeing him for a wound also on the base of the fourth med head on the right. X- ray of this area did not show osteomyelitis in relation to a new wound on the tip of his right third toe. Again this probes to bone. Finally his second toe on the left which didn't really show anything on the MRI at least no report appears to have a separated cutaneous area which I think is going to come right off the tip of his toe and leave exposed bone. Whether this is infectious or ischemic I am not clear Culture I did from the third toe last week which was his new wound was negative. Plain x-ray on the right foot did not show osteomyelitis in  the toes 01/24/18; the patient is going went to see Dr. [name]. He is going to have a amputation of the left second and right third toe although paradoxically the left second toe looks better than last week. We have treating a deep probing wound on the left lateral foot and the area over the third metatarsal head on the right foot. 2/5/19the patient had amputation of the left second and right third  toes. Also a debridement including bone of the left lateral foot and a closure which is still sutured. This is a bit surprising. Also apparent debridement of the base of the right third toe wound. 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 I'm not sure exactly where we are in the frame of things here. The wound on the left lateral foot is still sutured also the amputation sites of the left second and right third toe. He is been using Xeroform to the sutured areas silver alginate to the right foot 02/14/18; he continues on Rocephin at dialysis for osteomyelitis. I still don't have a good sense of where we are in the treatment duration. The wounds on the left lateral foot had the sutures removed and this clearly still probes deeply. We debrided the area with a #5 curet. We'll use silver alginate to the wound oothe area over the right third met head also required debridement of callus skin and subcutaneous tissue and necrotic debris over the wound surface. This tunnels superiorly but I did not unroofed this today. ooThe areas on the tip of his toes have a crusted surface eschar I did not debridement this either. 02/21/18; he continues on Rocephin at dialysis for osteomyelitis. I don't have a good sense of where we are and the treatment duration. The wounds on the left lateral foot is callused over on  presentation but requires debridement. The area on the right third med head plantar aspect actually is measuring smaller 02/28/18;patient is on Vanco and Fortaz at dialysis, I'm not sure why I thought this was Rocephin above unless it is been changed. I don't have a good sense of time frame here. Using silver collagen to the area on the left lateral foot and right third med head plantar foot 03/07/18; patient is completing bank and Fortaz at dialysis soon. He is using Silver collagen to the area on the left lateral foot and the right third metatarsal head. He has a smaller superficial wound distal to the left lateral foot wound. 03/14/18-he is here in follow-up evaluation for multiple ulcerations to his bilateral feet. He presents with a new ulceration to the plantar aspect of the left foot underneath the blister, this was deroofed to reveal a partial thickness ulcer. He is voicing no complaints or concerns, tolerated dialysis yesterday. We will continue with same treatment plan and he will follow-up next week 03/21/18; the patient has a area on the lateral left foot which still has a small probing area. The overall surface area of the wound is better. He presented with a new ulceration on the left foot plantar fifth metatarsal head last week. The area on the right third metatarsal head appears smaller.using silver alginate to all wound areas. The patient is changing the dressing himself. He is using a [place] forefoot offloading are on the right and a healing sandal on the left 03/28/18; original wound left lateral foot. Still nowhere close to looking like a heeling surface oosecondary wound on the left lateral foot at the base of his question fifth metatarsal which was new last week ooRight third metatarsal head. ooWe have been using silver alginate all wounds 04/04/18; the original wound on the left lateral foot again heavy callus surrounding thick nonviable subcutaneous tissue all requiring  debridement. The new  wound from 2 weeks ago just near this at the base of the fifth metatarsal head still looks about the same ooUnfortunately there is been deterioration in the third medical head wound on the right which now probes to bone. I must say this was a superficial wound at one point in time and I really don't have a good frame of reference year. I'll have to go back to look through records about what we know about the right foot. 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 He is also been to see Dr. [name]. Somewhere in here as somebody is ordered a VASCULAR evaluation which was done on 03/22/18. On the right is anterior tibial artery was monophasic triphasic at the posterior tibial artery. On the left anterior tibial artery monophasic posterior tibial artery monophasic. ABI in the right was 1.43 on the left 1.21. TBIs on the right at 0.41 and on the left at 0.32. A vascular consult was recommended and I think has been arranged. 04/11/18; Mr. Wulf has not had an MRI of the right foot since 2017. Recent x-ray of the right foot done in January and February was negative. However he has had a major deterioration in the wound over the third met head. He is completed antibiotics last week at dialysis [place]. I think he is going to see vascular in follow-up. The areas on the left lateral foot and left plantar fifth metatarsal head both look satisfactory. I debrided both of these areas although the tissue here looks good. The area on the left lateral foot once probe to bone it certainly does not do that now 04/18/18; MRI of the right foot is on Thursday. He has what looks to be serosanguineous purulent drainage coming out of the wound over the right third metatarsal head today. He completed antibiotics bank and Fortaz 2 weeks ago at dialysis. He has a vascular follow-up with regards to his  arterial insufficiency although I don't exactly see when that is booked. The areas on the left foot including the lateral left foot and the plantar left fifth metatarsal head look about the same. 04/25/18-He is here in follow-up evaluation for bilateral foot ulcers. MRI obtained was negative for osteomyelitis. Wound culture was negative. We will continue with same treatment plan he'll follow-up next week 05/02/18; the right plantar foot wound over the third metatarsal head actually looks better than when I last saw this. His MRI was negative for osteomyelitis wound culture was negative. ooOn the left plantar foot both wounds on the plantar fifth metatarsal head and on the lateral foot both are covered in a very hard circumferential callus. 05/09/18; right plantar foot wound over the third metatarsal head stable from last week. ooLeft plantar foot wound over the fifth metatarsal head also stable but with callus around both wound areas ooThe area on the left lateral foot had thick callus over a surface and I had some thoughts about leaving this intact however it felt boggy. ooWe've been using silver alginate all wounds 05/16/18; since the patient was last here he was hospitalized from 5/15 through 5/19. 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 This grew MRSA. He was placed on vancomycin.MRI of the left foot suggested "progressive" osteomyelitis and bone destruction of the cuboid and the base of the fifth metatarsal.  Stable erosive changes of the base of the second metatarsal. I'm wondering if they're aware that he had surgery and debridement in the area of the underlying bone previously by Dr. [name]. In any case, He was also revascularized with an angioplasty and stenting of the left tibial peroneal trunk and angioplasty of the left posterior tibial artery. He is on vancomycin at dialysis. He  has a 2 week follow-up with infectious disease. He has vascular surgery follow-up. He was started on Plavix. 05/23/18; the patient's wound on the lateral left foot at the level of the fifth metatarsal head and the plantar wound on the plantar fifth metatarsal head both look better. The area on the right third metatarsal head still has depth and undermining. We've been using silver alginate. He is on vancomycin. He has been revascularized on the left 05/30/18; he continues on vancomycin at dialysis. Revascularized on the left. The area on the left lateral foot is just about closed. Unfortunately the area over the plantar fifth metatarsal head undermining superiorly as does the area over the right third metatarsal head. Both of these significantly deteriorated from last week. He has appointments next week with infectious disease and orthopedics I delayed putting on a cast on the left until those appointments which therefore we made we'll bring him in on Friday the 14th with the idea of a cast on the left foot 06/06/18; he continues on vancomycin at dialysis. Revascularized on the left. He arrives today with a ballotable swelling just above the area on the left lateral foot where his previous wound was. He has no pain but he is reasonably insensate. The difficult areas on the plantar left fifth metatarsal head looks stable whereas the area on the right third plantar metatarsal head about the same as last week there is undermining here although I did not unroofed this today. He required a considerable debridement of the swelling on the left lateral foot area and I unroofed an abscess with a copious amount of brown purulent material which I obtained for culture. I don't believe during his recent hospitalization he had any further imaging although I need to review this. Previous cultures from this area done in this clinic showed MRSA, I would be surprised if this is not what this is currently even though  he is on vancomycin. 06/13/18; he continues on vancomycin at dialysis however he finishes this on Sunday and then graduates the doxycycline previously prescribed by Dr. [name]. The abscess site on the lateral foot that I unroofed last time grew moderate amounts of methicillin-resistant staph aureus that is both vancomycin and tetracycline sensitive so we should be okay from that regard. He arrives today in clinic with a connection between the abscess site and the area on the lateral foot. We've been using silver alginate to all his wound areas. The right third metatarsal head wound is measuring smaller. Using silver alginate on both wound areas 06/20/18; transitioning to doxycycline prescribed by Dr. [name]. The abscess site on the lateral foot that I unroofed 2 weeks ago grew MRSA. That area has largely closed down although the lateral part of his wound on the fifth metatarsal head still probes to bone. I suspect all of this was connected. ooOn the right third metatarsal head smaller looking wound but surrounded by nonviable tissue that once again requires debridement using silver alginate on both areas 06/27/18; on doxycycline 100 twice a day. The abscess on the lateral foot has closed down. Although he still has the wound on  the left fifth metatarsal head that extends towards the lateral part of the foot. The most lateral part of this wound probes to bone ooRight third metatarsal head still a deep probing wound with undermining. Nevertheless I elected not to debridement this this week ooIf everything is copious static next week on the left I'm going to attempt a total contact cast 07/04/18; he is tolerating his doxycycline. The abscess on the left lateral foot is closed down although there is still a deep wound here that probes to bone. We will use silver collagen under the total contact cast Silver collagen to the deep wound over the right third metatarsal head is well 07/11/18; he is  tolerating doxycycline. The left lateral fifth plantar metatarsal head still probes deeply but I could not probe any bone. We've been using silver collagen and this will be the second week under the total contact cast ooSilver collagen to deep wound over the right third metatarsal head as well 07/18/18; he is still taking doxycycline. The left lateral fifth plantar metatarsal this week probes deeply with a large amount of exposed bone. Quite a deterioration. He required extensive debridement. Specimens of bone for pathology and CNS obtained ooAlso considerable debridement on the right third metatarsal head 07/25/18; bone for pathology last week from the lateral left foot showed osteomyelitis. CandS of the bone Enterobacter. And Klebsiella. He is now on ciprofloxacin and the doxycycline is stopped [Dr. [name] of infectious disease] area He has an appointment with Dr. [name] tomorrow I'm going to leave the total contact cast off for this week. May wish to try to reapply that next week or the week after depending on the wound bed looks. I'm not sure if there is an operative option here, previously is followed with Dr. [name] 08/01/18 on evaluation today patient presents for reevaluation. He has been seen by infectious disease, Dr. [name], and he has placed the patient back on Ceftaz currently to be given to him at dialysis three times a week. 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 Fortunately there does not appear to be the evidence of infection at this point in time. Overall the patient has been tolerating the dressing changes without complication. We are no longer due lives in the cast of left foot secondary to the infection obviously. 08/10/18 on evaluation today patient appears to be doing rather well all things considered in regard to his left plantar foot ulcer. He does have a significant callous on the lateral portion  of his left foot he wonders if I can help clean that away to some degree today. Fortunately he is having no evidence of infection at this time which is good news. No fevers chills noted. With that being said his right plantar foot actually does have a significant callous buildup around the wound opening this seems to not be doing as well as I would like at this point. I do believe that he would benefit from sharp debridement at this site. Subsequently I think a total contact cast would be helpful for the right foot as well. 08/17/18; the patient arrived today with a total contact cast on the right foot. This actually looks quite good. He had gone to see Dr. Megan Salon of infectious disease about the osteomyelitis on the left foot. I think he is on IV Fortaz at dialysis although I am not exactly sure of the rationale for the Fortaz at the time of  this dictation. He also arrived in today with a swelling on the lateral left foot this is the site of his original wounds in fact when I first saw this man when he was under Dr. Ardeen Garland care I think this was the site of where the wound was located. It was not particularly tender however using a small scalpel I opened it to [place] some moderate amount of purulent drainage. 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 Last MRI was in May that showed progressive osteomyelitis and bone destruction of the cuboid and base of the fifth metatarsal. Last week he had an abscess over this same area this was removed. Culture was negative. ooWe are continuing with a total contact cast to the area on the third metatarsal head on the right and making some good progress here. The patient asked me again about renal transplant. He is not on the list because of the open wounds. 08/31/18; apparently the patient had an interruption in the [place] but he is  now back on this at dialysis. Apparently there was a misunderstanding and Dr. Crissie Figures orders. Due to have the MRI of the left foot tonight. ooThe area on the right foot continues to have callus thick skin and subcutaneous tissue around the wound edge that requires constant debridement however the wound is smaller we are using a total contact cast in this area. Alginate all wounds 09/07/18; without much surprise the MRI of his left foot showed osteomyelitis in the reminiscent of his fourth metatarsal but also the third metatarsal. She arrives in clinic today with the right foot and a total contact cast. There was purulent drainage coming out of the wound which I have cultured. Marked deterioration here with undermining widely around the wound orifice. Using pickups and a scalpel I remove callus and subcutaneous tissue from a substantial new opening. ooIn a similar fashion the area on the left lateral foot that was blistered last week I have opened this and remove skin and subcutaneous tissue from this area to expose a obvious new wound. I think there is extension and communication between all of this on the left foot. ooThe patient is on Fortaz at dialysis. Culture done of the right foot. We are clearly not making progress here. ooWe have made him an appointment with Dr. [name] of orthopedics. I think an amputation of the left leg may be discussed. I don't think there is anything that can be done with foot salvage. 09/14/18; culture from the right foot last time showed a very resistant MRSA. This is resistant to doxycycline. I'm going to try to get linezolid at least for a week. He does not have a appointment with Dr. [name] yet. 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 I'll send a message to Dr. [name] of infectious disease. I want the patient to see Dr. [name] to go over the pros and cons of an amputation which I think will be a  BKA. The patient had a question about whether this is curative or not. I told him that I thought it would be although spread of staph aureus infection is not unheard of. MRI of the right foot was done at the end of April 2019 did not show osteomyelitis. The patient last saw Dr. [name] on 9/3. I'll send Dr. [name] a message.  I would really like him to weigh the pros and cons of a BKA on the left otherwise he'll probably need IV antibiotics and perhaps hyperbaric oxygen again. He has not had a good response to this in the past. His MRI earlier this month showed progressive damage in the remnants of the fourth and third metatarsal 09/21/2018; sees Dr. [name] of orthopedics next week to discuss a left BKA in response to the osteomyelitis in the left foot. Using silver alginate to all wound areas. He is completing the Zyvox I did put in for him after last culture showed MRSA 09/29/2018; patient saw Dr. [name] of orthopedics discussed the osteomyelitis in the left foot third and fourth metatarsals. He did not recommend urgent surgery but certainly stated the only surgical option would be a BKA. He is communicating with Dr. [name]. The problem here is the instability of the areas on his foot which constantly generate draining abscesses. I will communicate with Dr. [name] about this. He has an appointment on 10/23 10/05/2018; patient sees Dr. [name] on 10/23. I have sent him a secure message not ordered any additional antibiotics for now. Patient continues to have 2 open areas on the left plantar foot at the fifth metatarsal head and the lateral aspect of the foot. These have not changed all that much. The area on the right third met head still has thick callus and surrounding subcutaneous tissue we have been using silver alginate 10/12/2018; patient sees Dr. [name] or colleague on 10/23. 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 The  area on the right plantar foot is just about closed. The superior wound on the left fifth metatarsal head is callused over but I am not sure if this is closed. The area below it is about the same. Considerable amount of callus on the lateral foot. We have been using silver alginate to the wounds 10/19/2018; the patient saw Dr. [name] on 10/22. He wishes to try and continue to save the left foot. He has been given 6 weeks of oral ciprofloxacin. We continue to put a total contact cast on the right foot. The area over the fifth metatarsal head on the left is callused over/perhaps healed but I did not remove the callus to find out. He still has the wound on the left lateral midfoot requiring debridement. We have been using silver alginate to all wound areas 10/26/2018 ooOn the left foot our intake nurse noted some purulent drainage from the inferior wound [currently the only one that is not callused over] ooOn the right foot even though he is in total contact cast a considerable amount of thick black callus and surface eschar. On this side we have been using silver alginate under a total contact cast. oohe remains on ciprofloxacin as prescribed by Dr. [name] 11/02/2018; culture from last week which is done of the probing area on the left midfoot wound showed MRSA "few". I am going to need to contact Dr. [name] which I will do today I think he is going to need IV antibiotics again. The area on the left foot which was callused on the side is clearly separating today all of this was removed a copious amounts of callus and necrotic subcutaneous tissue. ooThe area on the right plantar foot actually remained healthy looking with a healthy granulated base. ooWe are using silver alginate to all wound areas 11/09/2018; unfortunately neither 1 of the patient's wounds areas looks at all  satisfactory. On the left he has considerable necrotic debris from the plantar wound laterally over the foot. I  removed copious amounts of material including callus skin and subcutaneous tissue. On the right foot he arrives with undermining and frankly purulent drainage. Specimen obtained for culture and debridement of the callused skin and subcutaneous tissue from around the circumference. Because of this I cannot put him back in a total contact cast but to be truthful we are really unfortunately not making a lot of progress I did put in a secure text message to Dr. [name] wondering about the MRSA on the left foot. He suggested vancomycin although this is not started. I was left wondering if he expected me to send this into dialysis. Has we have more purulence on the right side wait for that result before calling dialysis 11/16/2018; I called dialysis earlier this week to get the vancomycin started. I think he got the first dose on Tuesday. The patient tells me that he fell earlier this week twisting his left foot and ankle and he is swelling. He continues to not look well. He had blood cultures done at dialysis on Tuesday he is not been informed of the results. 12/07/2018; the patient was admitted to hospital from 11/22/2018 through 12/02/2018. He underwent a left BKA. Apparently had staph sepsis. In the meantime being off the right foot this is closed over. He follows up with Dr. 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 Subsequent to this he actually had multiple falls in fact he tells me to following which calls the wound to be his apparently. He has been seeing Dr. [name] and his physician assistant in the interim. They actually did want him to come back for reevaluation to see if there's anything we can do for me wound care perspective the help this area to heal more appropriately. The patient states that he does not have a tremendous amount of pain which is good news. No fevers,  chills, nausea, or vomiting noted at this time. We have gotten approval from Dr. [name] office had Malcolm for [place] to treat the patient for his stop wound. This is due to the fact that the patient was in the 90 day postop global. 1/21; the patient does not have an open wound on the right foot although he does have some pressure areas that will need to be padded when he is transferring. The dehisced surgical wound looks clean although there is some undermining of 1.5 cm superiorly. 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 This appears healthy but there is undermining. TheraSkin #1 2/11; TheraSkin #2 2/25; TheraSkin #3. Wound is measuring smaller 3/10; TheraSkin #4 wound is measuring smaller 3/24; TheraSkin #5 wound is measuring smaller and looks healthy. 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 He has no open area on the right foot although he does have some callused area over the original third metatarsal head wound. The third metatarsal is also subluxed on the right foot. I have warned him today that he cannot consider wearing a prosthesis for at least a month but he can go for measurements. He is going to need to keep the right foot padded is much as possible in his diabetic shoe indefinitely READMISSION 06/12/2019 Mr. Doring is a type II diabetic on  dialysis. He has been in this clinic multiple times with wounds on his bilateral lower extremities. 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 He also has a history of wounds on his right foot but tells me that everything is going well here. He actually came in here with his prosthesis walking with a cane. We were all really quite gratified to see this He states over the last several weeks he has had a painful area at the tip of  the left third finger. His dialysis shunt in his is in the left upper arm and this particularly hurts during dialysis when his fingers get numb and there is a lot of pain in the tip of his left third finger. I believe he is seen vascular surgery. He had a Doppler done to evaluate for a dialysis steal syndrome. He is going for a banding procedure 1 week from today towards the left AV fistula. I think if that is unsuccessful they will be looking at creating a new shunt. He had a course of doxycycline when he which he finished about a week ago. He has been using Bactroban to the finger 6/25; the patient had his banding procedure and things seem to be going better. He is not having pain in his hand at dialysis. The area on the tip of his finger seems about closed. He did however have a paronychia I the last time I saw him. I have been advising him to use topical Bactroban washing the finger. Culture I did last time showed a few staph epidermidis which I was willing to dismiss as superficial skin contaminant. He arrives today with the finger wound looking better however the paronychia a seems worse 7/9; 2-week follow-up. He does not have an open wound on the tip of his third left finger. I thought he had an initial ischemic wound on the tip of the finger as well as a paronychia a medially. All of this appears to be closed. 8/6-Patient returns after 3 weeks for follow-up and has a right second met head plantar callus with a significant area of maceration hiding an ulcer ABI repeated today on right - 1.26 8/13-Patient returns at 1 week, the right second met head plantar wound appears to be slightly better, it is surrounded by the callus area we are using silver alginate 8/20; the patient came back to clinic 2 weeks ago with a wound on the right second metatarsal head. He apparently told me he developed callus in this area but also went away from his custom shoes to a pair of running shoes. Using silver  alginate. 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 There might be much we can do to offload this although the patient tells me he has a wheelchair that he is using to try and stay off the wound is much as possible 8/27; right second metatarsal head. Still small punched out area with thick callus and subcutaneous tissue around the wound. We have been using silver collagen. This is always been an issue with this man's wound especially on this foot 9/3; right second met head. Still the same punched-out area with thick callus and subcutaneous tissue around the wound removing the circumference demonstrates repetitively undermining area. I have removed all the subcutaneous tissue associated with this. We have been using silver collagen 9/15; right second metatarsal head.  Same punched-out thick callus and subcutaneous tissue around the wound area. Still requiring debridement we have been using silver collagen I changed back to silver alginate X-ray last time showed no evidence of osteomyelitis. Culture showed Klebsiella and he was started and Keflex apparently just 4 days ago 9/24; right second metatarsal head. He is completed the antibiotics I gave him for 10 days. Same punched-out thick callus and subcutaneous tissue around the area. Still requiring debridement. We have been using silver alginate. The area itself looks swollen and this could be just [place] that comes on this area with walking on a prosthesis on the left however I think he needs an MRI and I am going to order that today. There is not an option to offload this further 10/1; right second plantar metatarsal head. MRI booked for October 6. Generally looking better today using silver alginate 10/8; right second plantar metatarsal head. MRI that was done on 10/6 was negative for osteomyelitis noted prior amputations of the second and fourth toes at the [facility]. Prior amputation of the third toe to the base of the  middle phalanx. Prior amputation of the fifth toe to the head of the metatarsal. 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 He does not recall about hearing about this. He did not have any pain but then again he has reduced sensation from diabetic neuropathy 10/15; right second plantar metatarsal head. Still in the same condition callus nonviable subcutaneous tissue which cleans up nicely with debridement but reforms by the next week. The patient tells me that he is offloading this is much as he can including taking his wheelchair to dialysis. We have been using silver alginate, changed to silver collagen today 10/22; right second plantar metatarsal head. Wound measures smaller but still thick callus around this wound. Still requiring debridement 11/5 right second plantar metatarsal head. Less callus around the wound circumference but still requiring debridement. There is still some undermining which I cleaned out the wound looks healthy but still considerable punched out depth with a relatively small circumference to the wound there is no palpable bone no purulent drainage 11/12; right second plantar metatarsal head. Small wound with thick callus tissue around the wound and significant undermining. We have been using silver collagen after my continuous debridements of this area 11/19; patient arrives in today with purulent drainage coming out of the wound in the second third met head area on the right foot. Serosanguineous thick drainage. Specimen obtained for culture. 11/21/2019 on evaluation today patient appears to be doing well with regard to his foot ulcer. The good news is is culture came back negative for any bacteria there was no growth. The other good news is his x-ray also appears to be doing well. The foot is also measuring much better than what it was. Overall I am very pleased with how things seem to be progressing. 12/22; patient  was in [facility] for several weeks due to the death of a family member. He said he stayed off the wound using silver alginate. 01/03/2020. Patient has a small open area with roughly 0.3 cm of circumferential undermining. The orifice looks smaller. We have been using silver collagen. There is not a wound more aggressive way to offload this area as he has a prosthesis on the other leg 1/21; again the same small area is 2 weeks ago. We have been using silver collagen I change that to endoform today I really believe that this  is an offloading issue 2/4; the area on the right third met head. We have been using endoform. Objective Constitutional Patient is hypotensive. However he appears well. Pulse regular and within target range for patient.Marland Kitchen Respirations regular, non-labored and within target range.. Temperature is normal and within the target range for the patient.Marland Kitchen Appears in no distress. Vitals Time Taken: 1:00 PM, Height: 73 in, Weight: 202 lbs, BMI: 26.6, Temperature: 98.1 F, Pulse: 89 bpm, Respiratory Rate: 18 breaths/min, Blood Pressure: 85/49 mmHg, Capillary Blood Glucose: 150 mg/dl. General Notes: patient stated CBG this morning was 150 General Notes: Wound exam; thick nonviable callus, skin and subcutaneous tissue. Again requiring an aggressive debridement with a #5 curette. This is no better than 2 weeks ago. I am able to identify the wound healthy granulation but deep. It does not probe to bone there is no obvious infection. Integumentary (Hair, Skin) Wound #41 status is Open. Original cause of wound was Gradually Appeared. The wound is located on the Right Metatarsal head second. The wound measures 0.8cm length x 0.8cm width x 1cm depth; 0.503cm^2 area and 0.503cm^3 volume. There is Fat Layer (Subcutaneous Tissue) Exposed exposed. There is no tunneling or undermining noted. There is a small amount of serosanguineous drainage noted. The wound margin is well defined and not  attached to the wound base. There is large (67-100%) pink, pale granulation within the wound bed. There is a small (1-33%) amount of necrotic tissue within the wound bed including Adherent Slough. 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 There was a Excisional Skin/Subcutaneous Tissue Debridement with a total area of 1 sq cm performed by Ricard Dillon., MD. With the following instrument(s): Curette to remove Viable and Non-Viable tissue/material. Material removed includes Subcutaneous Tissue, Skin: Dermis, Skin: Epidermis, and Fibrin/Exudate after achieving pain control using Lidocaine 4% Topical Solution. A time out was conducted at 13:28, prior to the start of the procedure. A Minimum amount of bleeding was controlled with Pressure. The procedure was tolerated well with a pain level of 0 throughout and a pain level of 0 following the procedure. Post Debridement Measurements: 0.8cm length x 0.8cm width x 1cm depth; 0.503cm^3 volume. Character of Wound/Ulcer Post Debridement is improved. Severity of Tissue Post Debridement is: Fat layer exposed. Post procedure Diagnosis Wound #41: Same as Pre-Procedure Plan Follow-up Appointments: Return Appointment in 1 week. - Thursday Dressing Change Frequency: Wound #41 Right Metatarsal head second: Change Dressing every other day. Skin Barriers/Peri-Wound Care: Barrier cream - as needed for maceration, wetness, or redness. Wound Cleansing: May shower and wash wound with soap and water. Primary Wound Dressing: Wound #41  Right Metatarsal head second: Skin Substitute Application - wound center to [place]. Calcium Alginate with Silver - pack into wound bed. Secondary Dressing: Wound #41 Right Metatarsal head second: Foam - foam donut Kerlix/Rolled Gauze Dry Gauze Other: - felt to periwound and place one below wound. lightly wrap coban to hold dressing in place. felt / pad wound Edema Control: Avoid standing for long periods of time Elevate legs to the level of the heart or above for 30 minutes daily and/or when sitting, a frequency  of: - throughout the day. Off-Loading: Other: - felt to periwound and place one below wound. Additional Orders / Instructions: Other: - minimize walking and standing on foot to aid in wound healing and callus buildup. #1 Silver alginate as the primary dressing. 2. Run Dermagraft through his insurance 3. I have asked him to stay off this area i.e. wheelchair even going to dialysis. 4. With his artificial limb on the left there is not a lot of other ways to offload this. 5 if we can get this area to stabilize with aggressive offloading I would probably try the Dermagraft. 6. Cannot rule out more culturing more x-rays although he has had a previous MRI of this foot I believe late last fall that was negative for osteomyelitis Electronic Signature(s) Signed: 02/01/2020 5:45:44 PM By: Linton Ham MD Signed: 02/05/2020 5:30:42 PM By: Levan Hurst RN, BSN Previous Signature: 01/31/2020 5:50:02 PM Version By: Linton Ham MD Entered By: Levan Hurst on 02/01/2020 08:11:00 -------------------------------------------------------------------------------- SuperBill Details Patient Name: Date of Service: BREYLEN, AGYEMAN 01/31/2020 Medical Record [id number] Patient Account Number: [id number] Date of Birth/Sex: Treating RN: Oct 08, 1951 (70 y.o. [name], Meta.Reding Primary Care Provider: [name] Other Clinician: Referring Provider: Treating  Provider/Extender:Shulamis Wenberg, Jeronimo Greaves, Buena Irish in Treatment: 33 Diagnosis Coding ICD-10 Codes Code Description E11.621 Type 2 diabetes mellitus with foot ulcer L97.512 Non-pressure chronic ulcer of other part of right foot with fat layer exposed I70.298 Other atherosclerosis of native arteries of extremities, other extremity L03.115 Cellulitis of right lower limb Facility Procedures The patient participates with Medicare or their insurance follows the Medicare Facility Guidelines: CPT4 Code Description Modifier Quantity [id number] 11042 - DEB SUBQ TISSUE 20 SQ CM/< 1 ICD-10 Diagnosis Description E11.621 Type 2 diabetes mellitus with  foot ulcer L97.512 Non-pressure chronic ulcer of other part of right foot with fat layer exposed Physician Procedures CPT4 Code Description: [id number] - WC PHYS SUBQ TISS 20 SQ CM ICD-10 Diagnosis Description E11.621 Type 2 diabetes mellitus with foot ulcer L97.512 Non-pressure chronic ulcer of other part of right foot with Modifier: fat layer e Quantity: 1 xposed Electronic Signature(s) Signed: 01/31/2020 5:50:02 PM By: Linton Ham MD Entered By: Linton Ham on 01/31/2020 15:03:26
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
## 2020-02-07 ENCOUNTER — Encounter (HOSPITAL_BASED_OUTPATIENT_CLINIC_OR_DEPARTMENT_OTHER): Payer: Medicare Other | Admitting: Internal Medicine

## 2020-02-07 ENCOUNTER — Other Ambulatory Visit: Payer: Self-pay

## 2020-02-07 DIAGNOSIS — E11621 Type 2 diabetes mellitus with foot ulcer: Secondary | ICD-10-CM | POA: Diagnosis not present

## 2020-02-08 NOTE — Progress Notes (Signed)
[name], [name] ([id number]) Visit Report for 02/07/2020 Debridement Details Patient Name: Date of Service: [name], [name] 02/07/2020 10:15 AM Medical Record [id number] Patient Account Number: [id number] Date of Birth/Sex: 02/27/1951 (69 y.o. M) Treating RN: Deon Pilling Primary Care Provider: [name] Other Clinician: Referring Provider: Treating Provider/Extender:Maryama Kuriakose, Birdena Crandall in Treatment: 34 Debridement Performed for Wound #41 Right Metatarsal head second Assessment: Performed By: Physician Ricard Dillon., MD Debridement Type: Debridement Severity of Tissue Pre Fat layer exposed Debridement: Level of Consciousness (Pre- Awake and Alert procedure): Pre-procedure Verification/Time Out Taken: Yes - 11:18 Start Time: 11:19 Pain Control: Lidocaine 4% Topical Solution Total Area Debrided (L x W): 1 (cm) x 1 (cm) = 1 (cm) Tissue and other material Viable, Non-Viable, Callus, Subcutaneous, Skin: Dermis , Fibrin/Exudate debrided: Level: Skin/Subcutaneous Tissue Debridement Description: Excisional Instrument: Curette Bleeding: Moderate Hemostasis Achieved: Pressure End Time: 11:23 Procedural Pain: 0 Post Procedural Pain: 0 Response to Treatment: Procedure was tolerated well Level of Consciousness Awake and Alert (Post-procedure): Post Debridement Measurements of Total Wound Length: (cm) 0.5 Width: (cm) 0.5 Depth: (cm) 1 Volume: (cm) 0.196 Character of Wound/Ulcer Post Improved Debridement: Severity of Tissue Post Debridement: Fat layer exposed Post Procedure Diagnosis Same as Pre-procedure Electronic Signature(s) Signed: 02/07/2020 5:57:11 PM By: Deon Pilling Signed: 02/08/2020 1:06:39 PM By: Linton Ham MD Entered By: Linton Ham on 02/07/2020 13:08:31 -------------------------------------------------------------------------------- HPI Details Patient Name: Date of Service: [name], [name] 02/07/2020 10:15  AM Medical Record [id number] Patient Account Number: [id number] Date of Birth/Sex: Treating RN: [birth year] (69 y.o. [name] Primary Care Provider: [name] Other Clinician: Referring Provider: Treating Provider/Extender:Dennie Vecchio, Jeronimo Greaves, Buena Irish in Treatment: 34 History of Present Illness Location: Patient presents with a wound to bilateral feet. Quality: Patient reports experiencing essentially no pain. Severity: Mildly severe wound with no evidence of infection Duration: Patient has had the wound for greater than 2 weeks prior to presenting for treatment HPI Description: The patient is a pleasant 69 yrs old bm here for evaluation of ulcers on the plantar aspect of both feet. 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 He has a left arm graft for access. He has been trying to stay off his feet for weeks but does not seem to have any improvement in the wound. He has been seeing someone at the foot center and was referred to the Wound Care center for further evaluation. 12/30/15 the patient has 3 wounds one over the right first metatarsal head and 2 on the left foot at the left fifth and lleft first metatarsal head. All of these look relatively similar. The one over the left fifth probe to bone I could not prove that any of the others did. I note his MRI in November that did not show osteomyelitis. His peripheral pulses seem robust. All of these underwent surgical debridement to remove callus nonviable skin and subcutaneous tissue 01/06/16; the patient had his sutures removed from the right fifth ray amputation. There may be a small open part of this superiorly but otherwise the incision looks good. Areas over his right first, left first and left fifth metatarsal head all underwent surgical debridement as a varying degree of callus, skin and nonviable subcutaneous tissue. The area that is most worrisome is the  right fifth metatarsal head which has a wound probes precariously close to bone. There is no purulent drainage or erythema 01/13/16; I'm not exactly sure of the status of the right fifth  ray amputation site however he follows with Dr. [name] later this week. The area over his right first plantar metatarsal head, left first and fifth plantar metatarsal head are all in the same status. Thick circumferential callus, nonviable subcutaneous tissue. Culture of the left fifth did not culture last week 01/20/16; all of the patient's wounds appear and roughly the same state although his amputation site on the right lateral foot looks better. His wounds over the right first, left first and left fifth metatarsal heads all underwent difficult surgical debridement removing circumferential callus nonviable skin and subcutaneous tissue. There is no overt evidence of infection in these areas. MRI at the end of December of the left foot did not show osteomyelitis, right foot showed osteomyelitis of the right fifth digit he is now status post amputation. 01/27/16 the patient's wounds over his plantar first and fifth metatarsal heads on the left all appear better having been started on a total contact cast last week. They were debridement of circumferential callus and nonviable subcutaneous tissue as was the wound over the first metatarsal head. His surgical incision on the right also had some light surface debridement done. 03/23/2016 -- the patient was doing really well and most of his wounds had almost completely healed but now he came back today with a history of having a discharge from the area of his right foot on the plantar aspect and also between his first and second toe. He also has had some discharge from the left foot. Addendum: I spoke to the PA [name] at the dialysis center whose fax number is [phone]. We discussed the infection the patient has and she will put the patient on vancomycin  and Fortaz until the final culture report is back. We will fax this as soon as available. 03/30/2016 -- left foot x-ray IMPRESSION:No definitive osteomyelitis noted. X-ray of the right foot -- IMPRESSION: 1. Soft tissue swelling. Prior amputation right fifth digit. No acute or focal bony abnormality identified. If osteomyelitis remains a clinical concern, MRI can be obtained. 2. Peripheral vascular disease. His culture reports have grown an MSSA -- and we will fax this report to his hemodialysis center. He was called in a prescription of oral doxycycline but I have told her not to fill this in as he is already on IV antibiotics. 04/06/2016 -- a few days ago, I spoke to the hemodialysis center nurse who had stopped the IV antibiotics and he was given a prescription for doxycycline 100 mg by mouth twice a day for a week and he is on this at the present time. 05/11/2016 -- he has recently seen his PCP this week and his hemoglobin A1c was 7. He is working on his paperwork to get his orthotic shoes. 05/25/2016 -- -- x-ray of the left foot IMPRESSION:No acute bony abnormality. No radiographic changes of acute osteomyelitis. No change since prior study. X-ray of the right foot -- IMPRESSION: Postsurgical changes are seen involving the fifth toe. No evidence of acute osteomyelitis. he developed a large blister on the medial part of his right foot and this opened out and drain fluid. 06/01/2016 -- he has his MRI to be done this afternoon. 06/15/2016 -- MRI of the left forefoot without contrast shows 2 separate regions of cutaneous and subcutaneous edema and possible ulceration and blistering along the ball of the foot. No obvious osteomyelitis identified. MRI of the right foot showed cutaneous and subcutaneous thickening plantar to the first digit sesamoid with an ulcer crater but no  underlying osteomyelitis is identified. 06/22/2016 -- the right foot plantar ulcer has been draining a lot of  seropurulent material for the last few days. 06/30/2016 -- spoke to the dialysis center and I believe I spoke to [place] a PA at the center who discussed with me and agreed to putting Legrand Como on vancomycin until his cultures arrive. On review of his culture report no WBCs were seen or no organisms were seen and the culture was reincubated for better growth. The final report is back and there were no predominant growth including Streptococcus or Staphylococcus. Clinically though he has a lot of drainage from both wounds a lot of undermining and there is further blebs on the left foot towards the interspace between his first and second toe. 08/10/2016 -- the culture from the right foot showed normal skin flora and there was no Staphylococcus aureus was group A streptococcus isolated. 09/21/2016 -- -- MRI of the right foot was done on 09/13/2016 - IMPRESSION: Findings most consistent with acute osteomyelitis throughout the great toe, sesamoid bones and plantar aspect of the head of the first metatarsal. Fluid in the sheath of the flexor tendon of the great toe could be sympathetic but is worrisome for septic tenosynovitis. First MTP joint effusion worrisome for septic joint. He was admitted to the hospital on 09/11/2016 and treated for a fever with vancomycin and cefepime. He was seen by Dr. Bobby Rumpf of infectious disease who recommended 6 weeks treatment with vancomycin and ceftazidime with his hemodialysis and to continue to see as in the wound clinic. Vascular consult was pending. The patient was discharged home on 09/14/2016 and he would continue with IV antibiotics for 6 weeks. 09/28/16 wound appears reasonably healthy. Continuing with total contact cast 10/05/16 wound is smaller and looking healthy. Continue with total contact cast. He continues on IV antibiotics 10/12/2016 -- he has developed a new wound on the dorsal aspect of his left big toe and this is a superficial injury with no  surrounding cellulitis. He has completed 30 days of IV antibiotics and is now ready to start his hyperbaric oxygen therapy as per his insurance company's recommendation. 10/19/2016 -- after the cast was removed on the right side he has got good resolution of his ulceration on the right plantar foot. he has a new wound on the left plantar foot in the region of his fourth metatarsal and this will need sharp debridement. 10/26/2016 -- Xray of the right foot complete: IMPRESSION: Changes consistent with osteomyelitis involving the head of the first metatarsal and base of the first proximal phalanx. The sesamoid bones are also likely involved given their positioning. 11/02/2016 -- there still awaiting insurance clearance for his hyperbaric oxygen therapy and hopefully he will begin treatment soon. 11/09/2016 -- started with hyperbaric oxygen therapy and had some barotrauma to the right ear and this was seen by ENT who was prescribed Afrin drops and would probably continue with HBO and he is scheduled for myringotomy tubes on Friday 11/16/2016 -- he had his myringotomy tubes placed on Friday and has been doing much better after that with some fluid draining out after hyperbaric treatment today. The pain was much better. 11/30/2016 -- over the last 2 days he noticed a swelling and change of color of his right second toe and this had been draining minimal fluid. 12/07/2016 -- x-ray of the right foot -- IMPRESSION:1. Progressive ulceration at the distal aspect of the second digit with significant soft tissue swelling and osseous changes in the distal  phalanx compatible with osteomyelitis. 2. Chronic osteomyelitis at the first MTP joint. 3. Ulcerations at the second and third toes as well without definite osseous changes. Osteomyelitis is not excluded. 4. Fifth digit amputation. On 12/01/2016 I spoke to Dr. Bobby Rumpf, the infectious disease specialist, who kindly agreed to treat this with IV  antibiotics and he would call in the order to the dialysis center and this has been discussed in detail with the patient who will make the appropriate arrangements. The patient will also book an appointment as soon as possible to see Dr. [name] in the office. Of note the patient has not been on antibiotics this entire week as the dialysis center did not receive any orders from Dr. [name]. I got in touch with Dr. [name] who tells me the patient has an appointment to see him this coming Wednesday. 12/14/2016 -- the patient is on ceftazidime and vancomycin during his dialysis, and I understand this was put on by his nephrologist Dr. Raliegh Ip possibly after speaking with infectious disease Dr. [name] 12/23/16; patient was on my schedule today for a wound evaluation as he had difficulties with his schedule earlier this week. I note that he is on vancomycin and ceftazidine at dialysis. In spite of this he arrives today with a new wound on the base of the right second toe this easily probes to bone. The known wound at the tip of the second toe With this area. He also has a superficial area on the medial aspect of the third toe on the side of the DIP. This does not appear to have much depth. The area on the plantar left foot is a deep area but did not probe the bone 12/28/16 -- he has brought in some lab work and the most recent labs done showed a hemoglobin of 12.1 hematocrit of 36.3, neutrophils of 51% WBC count of 5.6, BN of 53, albumin of 4.1 globulin of 3.7, vancomycin 13 g per mL. 01/04/2017 -- he saw Dr. [name] who has recommended a amputation of the right second toe and he is awaiting this date. He sees Dr. Bobby Rumpf of infectious disease tomorrow. The bone culture taken on 12/28/2016 had no growth in 2 days. 01/11/2017 -- was seen by Dr. Bobby Rumpf regarding the management and has recommended a eval by vascular surgeons. He recommended to continue the antibiotics during his  hemodialysis. Xray of the left foot -- IMPRESSION: No acute fracture, dislocation, or osseous erosion identified. 01/18/2017 -- he has his vascular workup later today and he is going to have his right foot second toe amputation this coming Friday by Dr. [name]. We have also put in for a another 30 treatments with hyperbaric oxygen therapy 01/25/2017 -- I have reviewed Dr. Donnetta Hutching is vascular report from last week where he reviewed him and thought his vascular function was good enough to heal his amputation site and no further tests were recommended. He also had his orthopedic related to surgery which is still pending the notes and we will review these next week. 02/01/2017 -- the operative remote of Dr. 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 02/08/2017 --he was seen by Dr. [name] on 02/03/2017 -- patient  is on ceftaz and Vanco. after a thorough review he had recommended to continue with antibiotics during hemodialysis. The patient was seen by Dr. [name], but I do not find any follow-up note on the electronic medical record. he has removed the dressing over the right foot to remove the sutures and asked him to see him back in a month's time 02/22/2017 -- patient has been febrile and has been having symptoms of the upper respiratory tract infection but has not been checked for the flu and it's been over 4 days now. He says he is feeling better today. Some drainage between his left first and second toe and this needed to be looked at 03/08/2017 -- was seen by Dr. Bobby Rumpf on 03/07/2017 after review he will stop his antibiotics and see  him back in a month to see if he is feeling well. 03/15/2017 -- he has completed his course of hyperbaric oxygen therapy and is doing fine with his health otherwise. 03/22/2017 -- his nutritionist at the dialysis center has recommended a protein supplement to help build his collagen and we will prescribe this for him when he has the details 05/03/2017 -- he recently noticed the area on the plantar aspect of his fifth metatarsal head which opened out and had minimal drainage. 05/10/2017 -- -- x-ray of the left foot -- IMPRESSION:1. No convincing conventional radiographic evidence of active osteomyelitis.2. Active soft tissue ulceration at the tip of the fifth digit, and at the lateral aspect of the foot adjacent the base of the fifth metatarsal. 3. Surgical changes of prior fourth toe amputation. 4. 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 5. Small vessel atherosclerotic vascular calcifications. 6. 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 ======= Readmission after 5 weeks: 06/15/2017 -- the patient returns after 5 weeks having had an MRI done on 05/17/2017 MRI of the left foot without contrast showed soft tissue ulcer overlying the base of the fifth metatarsal. 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 No drainable fluid collection to suggest an abscess. He was in hospital between 05/16/2017 and 05/25/2017 -- and on discharge was asked to follow-up with Dr. [name] and Dr. [name]. He was treated 2 weeks post discharge with vancomycin plus ceftriaxone with hemodialysis and oral metronidazole 500 mg 3 times a day. 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 last hemoglobin A1c was 6.7% He had a postoperative MR of the foot on 05/23/2017 -- which showed no new areas of cortical bone loss,  edema or soft tissue ulceration to suggest osteomyelitis. the patient has completed his course of IV antibiotics during dialysis and is to see the infectious disease doctor tomorrow. Dr. [name] had seen him after suture removal and asked him to keep the wound with a dry dressing 06/21/2017 -- he was seen by Dr. Bobby Rumpf of infectious disease on 06/16/2017 -- he stopped his Flagyl and will see him back in 6 weeks. He was asked to follow-up with Dr. [name] and with the wound clinic clinic. 07/05/17; bone biopsy from last time showed acute osteomyelitis. This is from the reminiscent in the left fifth metatarsal. Culture result is apparently still pending [holding for anaerobe}. From my understanding in this case this is been a progressive necrotic wound which is deteriorated markedly over the last 3 weeks since he returned here. He now has  a large area of exposed bone which was biopsied and cultured last week. Dr. Graylon Good has put him on vancomycin and Fortaz during his hemodialysis and Flagyl orally. He is to see Dr. [name] next week 07/19/2017 -- the patient was reviewed by Dr. [name] of orthopedics who reviewed the case in detail and agreed with the plan to continue with IV antibiotics, aggressive wound care and hyperbaric oxygen therapy. He would see him back in 3 weeks' time 08/09/2017 -- saw Dr. Bobby Rumpf on 08/03/2017 -he was restarted on his antibiotics for 6 weeks which included vanco, ceftaz and Flagyl. he recommended continuing his antibiotics for 6 weeks and reevaluate his completion date. He would continue with wound care and hyperbaric oxygen therapy. 08/16/2017 -- I understand he will be completing his 6 weeks of antibiotics sometime later this week. 09/19/2017 -- he has been without his wound VAC for the last week due to lack of supplies. He has been packing his wound with silver alginate. 10/04/2017 -- he has an appointment with Dr. [name] tomorrow and hence we will not  apply the wound VAC after his dressing changes today. 10/11/2017 -- he was seen by Dr. Bobby Rumpf on 10/05/2017, noted that the patient was currently off antibiotics, and after thorough review he recommended he follow-up with the wound care as per plan and no further antibiotics at this point. 11/29/17; patient is arrived for the wound on his left lateral foot.Marland Kitchen He is completed IV antibiotics and 2 rounds of hyperbaric oxygen for treatment of underlying osteomyelitis. He arrives today with a surface on most of the wound area however our intake nurse noted drainage from the superior aspect. This was brought to my attention. 12/13/2017 -- the right plantar foot had a large bleb and once the bleb was opened out a large callus and subcutaneous debris was removed and he has a plantar ulcer near the fourth metatarsal head 12/28/17 on evaluation today patient appears to be doing acutely worse in regard to his left foot. The wound which has been appearing to do better as now open up more deeply there is bone palpable at the base of the wound unfortunately. He tells me that this feels "like it did when he had osteomyelitis previously" he also noted that his second toe on the left foot appears to be doing worse and is swollen there does appear to be some fluid collected underneath. His right foot plantar ulcer appears to be doing somewhat better at this point and there really is no complication at the site currently. No fevers, chills, nausea, or vomiting noted at this time. Patient states that he normally has no pain at this site however. Today he is having significant pain. 01/03/18; culture done last week showed methicillin sensitive staph aureus and group B strep. He was on Septra however he arrives today with a fever of 101. He has a functional dialysis shunt in his left arm but has had no pain here. No cough. He still makes some urine no dysuria he did have abdominal pain nausea and diarrhea over  the weekend but is not had any diarrhea since yesterday. Otherwise he has no specific complaints 01/05/18; the patient returns today in follow-up for his presentation from 1/8. At that point he was febrile. I gave him some Levaquin adjusted for his dialysis status. He tells me the fever broke that night and it is not likely that this was actually a wound infection. He is gone on to have an MRI of the  left foot; this showed interval development of an abnormal signal from the base of the third and fourth metatarsal and in the cuboid reminiscent consistent with osteomyelitis. There is no mention of the left second toe I think which was a concern when it was ordered. The patient is taking his Levaquin 01/10/18; the patient has completed his antibiotics today. The area on the left lateral foot is smaller but it still probes easily to bone. He has underlying osteomyelitis here and sees Dr. Megan Salon of infectious disease this week The area on the right third plantar metatarsal head still requires debridement not much change in dimensions He has a new wound on the medial tip of his right third toe again this probes to bone. He only noticed this 2 days ago 01/17/18; the patient saw Dr. [name] last week and he is back on Vanco and Fortaz at dialysis. 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 We are previously seeing him for a wound also on the base of the fourth med head on the right. X- ray of this area did not show osteomyelitis in relation to a new wound on the tip of his right third toe. Again this probes to bone. Finally his second toe on the left which didn't really show anything on the MRI at least no report appears to have a separated cutaneous area which I think is going to come right off the tip of his toe and leave exposed bone. Whether this is infectious or ischemic I am not  clear Culture I did from the third toe last week which was his new wound was negative. Plain x-ray on the right foot did not show osteomyelitis in the toes 01/24/18; the patient is going went to see Dr. [name]. He is going to have a amputation of the left second and right third toe although paradoxically the left second toe looks better than last week. We have treating a deep probing wound on the left lateral foot and the area over the third metatarsal head on the right foot. 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 Also a debridement including bone of the left lateral foot and a closure which is still sutured. This is a bit surprising. Also apparent debridement of the base of the right third toe wound. 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 I'm not sure exactly where we are in the frame of things here. The wound on the left lateral foot is still sutured also the amputation sites of the left second and right third toe. He is been using Xeroform to the sutured areas silver alginate to the right foot 02/14/18; he continues on Rocephin at dialysis for osteomyelitis. I still don't have a good sense of where we are in the treatment duration. The wounds on the left lateral foot had the sutures removed and this clearly still probes deeply. We debrided the area with a #5 curet. We'll use silver alginate to the wound the area over the right third met head also required debridement of callus skin and subcutaneous tissue and necrotic debris over the wound surface. This tunnels superiorly but I did not unroofed  this today. The areas on the tip of his toes have a crusted surface eschar I did not debridement this either. 02/21/18; he continues on Rocephin at dialysis for  osteomyelitis. I don't have a good sense of where we are and the treatment duration. The wounds on the left lateral foot is callused over on presentation but requires debridement. The area on the right third med head plantar aspect actually is measuring smaller 02/28/18;patient is on Vanco and Fortaz at dialysis, I'm not sure why I thought this was Rocephin above unless it is been changed. I don't have a good sense of time frame here. Using silver collagen to the area on the left lateral foot and right third med head plantar foot 03/07/18; patient is completing bank and Fortaz at dialysis soon. He is using Silver collagen to the area on the left lateral foot and the right third metatarsal head. He has a smaller superficial wound distal to the left lateral foot wound. 03/14/18-he is here in follow-up evaluation for multiple ulcerations to his bilateral feet. He presents with a new ulceration to the plantar aspect of the left foot underneath the blister, this was deroofed to reveal a partial thickness ulcer. He is voicing no complaints or concerns, tolerated dialysis yesterday. We will continue with same treatment plan and he will follow-up next week 03/21/18; the patient has a area on the lateral left foot which still has a small probing area. The overall surface area of the wound is better. He presented with a new ulceration on the left foot plantar fifth metatarsal head last week. The area on the right third metatarsal head appears smaller.using silver alginate to all wound areas. The patient is changing the dressing himself. He is using a Darco forefoot offloading are on the right and a healing sandal on the left 03/28/18; original wound left lateral foot. Still nowhere close to looking like a heeling surface secondary wound on the left lateral foot at the base of his question fifth metatarsal which was new last week Right third metatarsal head. We have been using silver alginate all wounds 04/04/18;  the original wound on the left lateral foot again heavy callus surrounding thick nonviable subcutaneous tissue all requiring debridement. The new wound from 2 weeks ago just near this at the base of the fifth metatarsal head still looks about the same Unfortunately there is been deterioration in the third medical head wound on the right which now probes to bone. I must say this was a superficial wound at one point in time and I really don't have a good frame of reference year. I'll have to go back to look through records about what we know about the right foot. 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 He is also been to see Dr. [name]. Somewhere in here as somebody is ordered a VASCULAR evaluation which was done on 03/22/18. On the right is anterior tibial artery was monophasic triphasic at the posterior tibial artery. On the left anterior tibial artery monophasic posterior tibial artery monophasic. ABI in the right was 1.43 on the left 1.21. TBIs on the right at 0.41 and on the left at 0.32. A vascular consult was recommended and I think has been arranged. 04/11/18; Mr. Zuercher has not had an MRI of the right foot since 2017. Recent x-ray of the right foot done in January and February was negative. However he has had a  major deterioration in the wound over the third met head. He is completed antibiotics last week at dialysis [place]. I think he is going to see vascular in follow-up. The areas on the left lateral foot and left plantar fifth metatarsal head both look satisfactory. I debrided both of these areas although the tissue here looks good. The area on the left lateral foot once probe to bone it certainly does not do that now 04/18/18; MRI of the right foot is on Thursday. He has what looks to be serosanguineous purulent drainage coming out of the wound over the right third metatarsal head  today. He completed antibiotics bank and Fortaz 2 weeks ago at dialysis. He has a vascular follow-up with regards to his arterial insufficiency although I don't exactly see when that is booked. The areas on the left foot including the lateral left foot and the plantar left fifth metatarsal head look about the same. 04/25/18-He is here in follow-up evaluation for bilateral foot ulcers. MRI obtained was negative for osteomyelitis. Wound culture was negative. We will continue with same treatment plan he'll follow-up next week 05/02/18; the right plantar foot wound over the third metatarsal head actually looks better than when I last saw this. His MRI was negative for osteomyelitis wound culture was negative. On the left plantar foot both wounds on the plantar fifth metatarsal head and on the lateral foot both are covered in a very hard circumferential callus. 05/09/18; right plantar foot wound over the third metatarsal head stable from last week. Left plantar foot wound over the fifth metatarsal head also stable but with callus around both wound areas The area on the left lateral foot had thick callus over a surface and I had some thoughts about leaving this intact however it felt boggy. We've been using silver alginate all wounds 05/16/18; since the patient was last here he was hospitalized from 5/15 through 5/19. 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 This grew MRSA. He was placed on vancomycin.MRI of the left foot suggested "progressive" osteomyelitis and bone destruction of the cuboid and the base of the fifth metatarsal. Stable erosive changes of the base of the second metatarsal. I'm wondering if they're aware that he had surgery and debridement in the area of the underlying bone previously by Dr. [name]. In any case, He was also revascularized with an angioplasty and stenting of the  left tibial peroneal trunk and angioplasty of the left posterior tibial artery. He is on vancomycin at dialysis. He has a 2 week follow-up with infectious disease. He has vascular surgery follow-up. He was started on Plavix. 05/23/18; the patient's wound on the lateral left foot at the level of the fifth metatarsal head and the plantar wound on the plantar fifth metatarsal head both look better. The area on the right third metatarsal head still has depth and undermining. We've been using silver alginate. He is on vancomycin. He has been revascularized on the left 05/30/18; he continues on vancomycin at dialysis. Revascularized on the left. The area on the left lateral foot is just about closed. Unfortunately the area over the plantar fifth metatarsal head undermining superiorly as does the area over the right third metatarsal head. Both of these significantly deteriorated from last week. He has appointments next week with infectious disease and orthopedics I delayed putting on a cast on the left until those appointments which  therefore we made we'll bring him in on Friday the 14th with the idea of a cast on the left foot 06/06/18; he continues on vancomycin at dialysis. Revascularized on the left. He arrives today with a ballotable swelling just above the area on the left lateral foot where his previous wound was. He has no pain but he is reasonably insensate. The difficult areas on the plantar left fifth metatarsal head looks stable whereas the area on the right third plantar metatarsal head about the same as last week there is undermining here although I did not unroofed this today. He required a considerable debridement of the swelling on the left lateral foot area and I unroofed an abscess with a copious amount of brown purulent material which I obtained for culture. I don't believe during his recent hospitalization he had any further imaging although I need to review this. Previous cultures  from this area done in this clinic showed MRSA, I would be surprised if this is not what this is currently even though he is on vancomycin. 06/13/18; he continues on vancomycin at dialysis however he finishes this on Sunday and then graduates the doxycycline previously prescribed by Dr. [name]. The abscess site on the lateral foot that I unroofed last time grew moderate amounts of methicillin-resistant staph aureus that is both vancomycin and tetracycline sensitive so we should be okay from that regard. He arrives today in clinic with a connection between the abscess site and the area on the lateral foot. We've been using silver alginate to all his wound areas. The right third metatarsal head wound is measuring smaller. Using silver alginate on both wound areas 06/20/18; transitioning to doxycycline prescribed by Dr. [name]. The abscess site on the lateral foot that I unroofed 2 weeks ago grew MRSA. That area has largely closed down although the lateral part of his wound on the fifth metatarsal head still probes to bone. I suspect all of this was connected. On the right third metatarsal head smaller looking wound but surrounded by nonviable tissue that once again requires debridement using silver alginate on both areas 06/27/18; on doxycycline 100 twice a day. The abscess on the lateral foot has closed down. Although he still has the wound on the left fifth metatarsal head that extends towards the lateral part of the foot. The most lateral part of this wound probes to bone Right third metatarsal head still a deep probing wound with undermining. Nevertheless I elected not to debridement this this week If everything is copious static next week on the left I'm going to attempt a total contact cast 07/04/18; he is tolerating his doxycycline. The abscess on the left lateral foot is closed down although there is still a deep wound here that probes to bone. We will use silver collagen under the total  contact cast Silver collagen to the deep wound over the right third metatarsal head is well 07/11/18; he is tolerating doxycycline. The left lateral fifth plantar metatarsal head still probes deeply but I could not probe any bone. We've been using silver collagen and this will be the second week under the total contact cast Silver collagen to deep wound over the right third metatarsal head as well 07/18/18; he is still taking doxycycline. The left lateral fifth plantar metatarsal this week probes deeply with a large amount of exposed bone. Quite a deterioration. He required extensive debridement. Specimens of bone for pathology and CNS obtained Also considerable debridement on the right third metatarsal head 07/25/18; bone for  pathology last week from the lateral left foot showed osteomyelitis. CandS of the bone Enterobacter. And Klebsiella. He is now on ciprofloxacin and the doxycycline is stopped [Dr. [name] of infectious disease] area He has an appointment with Dr. [name] tomorrow I'm going to leave the total contact cast off for this week. May wish to try to reapply that next week or the week after depending on the wound bed looks. I'm not sure if there is an operative option here, previously is followed with Dr. [name] 08/01/18 on evaluation today patient presents for reevaluation. He has been seen by infectious disease, Dr. [name], and he has placed the patient back on Ceftaz currently to be given to him at dialysis three times a week. 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 Fortunately there does not appear to be the evidence of infection at this point in time. Overall the patient has been tolerating the dressing changes without complication. We are no longer due lives in the cast of left foot secondary to the infection obviously. 08/10/18 on evaluation today patient appears to be doing rather well all things considered  in regard to his left plantar foot ulcer. He does have a significant callous on the lateral portion of his left foot he wonders if I can help clean that away to some degree today. Fortunately he is having no evidence of infection at this time which is good news. No fevers chills noted. With that being said his right plantar foot actually does have a significant callous buildup around the wound opening this seems to not be doing as well as I would like at this point. I do believe that he would benefit from sharp debridement at this site. Subsequently I think a total contact cast would be helpful for the right foot as well. 08/17/18; the patient arrived today with a total contact cast on the right foot. This actually looks quite good. He had gone to see Dr. Megan Salon of infectious disease about the osteomyelitis on the left foot. I think he is on IV Fortaz at dialysis although I am not exactly sure of the rationale for the Fortaz at the time of this dictation. He also arrived in today with a swelling on the lateral left foot this is the site of his original wounds in fact when I first saw this man when he was under Dr. Ardeen Garland care I think this was the site of where the wound was located. It was not particularly tender however using a small scalpel I opened it to [place] some moderate amount of purulent drainage. 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 Last MRI was in May that showed progressive osteomyelitis and bone destruction of the cuboid and base of the fifth metatarsal. Last week he had an abscess over this same area this was removed. Culture was negative. We are continuing with a total contact cast to the area on the third metatarsal head on the right and making some good progress here. The patient asked me again about renal transplant. He is not on the list because  of the open wounds. 08/31/18; apparently the patient had an interruption in the [place] but he is now back on this at dialysis. Apparently there was a misunderstanding and Dr. Crissie Figures orders. Due to have the MRI of the  left foot tonight. The area on the right foot continues to have callus thick skin and subcutaneous tissue around the wound edge that requires constant debridement however the wound is smaller we are using a total contact cast in this area. Alginate all wounds 09/07/18; without much surprise the MRI of his left foot showed osteomyelitis in the reminiscent of his fourth metatarsal but also the third metatarsal. She arrives in clinic today with the right foot and a total contact cast. There was purulent drainage coming out of the wound which I have cultured. Marked deterioration here with undermining widely around the wound orifice. Using pickups and a scalpel I remove callus and subcutaneous tissue from a substantial new opening. In a similar fashion the area on the left lateral foot that was blistered last week I have opened this and remove skin and subcutaneous tissue from this area to expose a obvious new wound. I think there is extension and communication between all of this on the left foot. The patient is on Fortaz at dialysis. Culture done of the right foot. We are clearly not making progress here. We have made him an appointment with Dr. [name] of orthopedics. I think an amputation of the left leg may be discussed. I don't think there is anything that can be done with foot salvage. 09/14/18; culture from the right foot last time showed a very resistant MRSA. This is resistant to doxycycline. I'm going to try to get linezolid at least for a week. He does not have a appointment with Dr. [name] yet. 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 I'll send a message to Dr. [name] of infectious disease. I want the patient to see  Dr. [name] to go over the pros and cons of an amputation which I think will be a BKA. The patient had a question about whether this is curative or not. I told him that I thought it would be although spread of staph aureus infection is not unheard of. MRI of the right foot was done at the end of April 2019 did not show osteomyelitis. The patient last saw Dr. [name] on 9/3. I'll send Dr. [name] a message. I would really like him to weigh the pros and cons of a BKA on the left otherwise he'll probably need IV antibiotics and perhaps hyperbaric oxygen again. He has not had a good response to this in the past. His MRI earlier this month showed progressive damage in the remnants of the fourth and third metatarsal 09/21/2018; sees Dr. [name] of orthopedics next week to discuss a left BKA in response to the osteomyelitis in the left foot. Using silver alginate to all wound areas. He is completing the Zyvox I did put in for him after last culture showed MRSA 09/29/2018; patient saw Dr. [name] of orthopedics discussed the osteomyelitis in the left foot third and fourth metatarsals. He did not recommend urgent surgery but certainly stated the only surgical option would be a BKA. He is communicating with Dr. [name]. The problem here is the instability of the areas on his foot which constantly generate draining abscesses. I will communicate with Dr. [name] about this. He has an appointment on 10/23 10/05/2018; patient sees Dr. [name] on 10/23. I have sent him a secure message not ordered any additional antibiotics for now. Patient continues to have 2 open areas on the left plantar foot at the fifth metatarsal head and the lateral aspect of  the foot. These have not changed all that much. The area on the right third met head still has thick callus and surrounding subcutaneous tissue we have been using silver alginate 10/12/2018; patient sees Dr. [name] or colleague on 10/23. 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 The area on the right plantar foot is just about closed. The superior wound on the left fifth metatarsal head is callused over but I am not sure if this is closed. The area below it is about the same. Considerable amount of callus on the lateral foot. We have been using silver alginate to the wounds 10/19/2018; the patient saw Dr. [name] on 10/22. He wishes to try and continue to save the left foot. He has been given 6 weeks of oral ciprofloxacin. We continue to put a total contact cast on the right foot. The area over the fifth metatarsal head on the left is callused over/perhaps healed but I did not remove the callus to find out. He still has the wound on the left lateral midfoot requiring debridement. We have been using silver alginate to all wound areas 10/26/2018 On the left foot our intake nurse noted some purulent drainage from the inferior wound [currently the only one that is not callused over] On the right foot even though he is in total contact cast a considerable amount of thick black callus and surface eschar. On this side we have been using silver alginate under a total contact cast. he remains on ciprofloxacin as prescribed by Dr. [name] 11/02/2018; culture from last week which is done of the probing area on the left midfoot wound showed MRSA "few". I am going to need to contact Dr. [name] which I will do today I think he is going to need IV antibiotics again. The area on the left foot which was callused on the side is clearly separating today all of this was removed a copious amounts of callus and necrotic subcutaneous tissue. The area on the right plantar foot actually remained healthy looking with a healthy granulated base. We are using silver alginate to all wound areas 11/09/2018; unfortunately neither 1 of the patient's wounds areas looks at all satisfactory. On the left he has considerable  necrotic debris from the plantar wound laterally over the foot. I removed copious amounts of material including callus skin and subcutaneous tissue. On the right foot he arrives with undermining and frankly purulent drainage. Specimen obtained for culture and debridement of the callused skin and subcutaneous tissue from around the circumference. Because of this I cannot put him back in a total contact cast but to be truthful we are really unfortunately not making a lot of progress I did put in a secure text message to Dr. [name] wondering about the MRSA on the left foot. He suggested vancomycin although this is not started. I was left wondering if he expected me to send this into dialysis. Has we have more purulence on the right side wait for that result before calling dialysis 11/16/2018; I called dialysis earlier this week to get the vancomycin started. I think he got the first dose on Tuesday. The patient tells me that he fell earlier this week twisting his left foot and ankle and he is swelling. He continues to not look well. He had blood cultures done at dialysis on Tuesday he is not been informed of the results. 12/07/2018; the patient was admitted to hospital from 11/22/2018 through 12/02/2018.  He underwent a left BKA. Apparently had staph sepsis. In the meantime being off the right foot this is closed over. He follows up with Dr. 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 Subsequent to this he actually had multiple falls in fact he tells me to following which calls the wound to be his apparently. He has been seeing Dr. [name] and his physician assistant in the interim. They actually did want him to come back for reevaluation to see if there's anything we can do for me wound care perspective the help this area to heal more appropriately. The patient states that he does not have  a tremendous amount of pain which is good news. No fevers, chills, nausea, or vomiting noted at this time. We have gotten approval from Dr. [name] office had [place] for [place] to treat the patient for his stop wound. This is due to the fact that the patient was in the 90 day postop global. 1/21; the patient does not have an open wound on the right foot although he does have some pressure areas that will need to be padded when he is transferring. The dehisced surgical wound looks clean although there is some undermining of 1.5 cm superiorly. 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 This appears healthy but there is undermining. TheraSkin #1 2/11; TheraSkin #2 2/25; TheraSkin #3. Wound is measuring smaller 3/10; TheraSkin #4 wound is measuring smaller 3/24; TheraSkin #5 wound is measuring smaller and looks healthy. 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 He has no open area on the right foot although he does have some callused area over the original third metatarsal head wound. The third metatarsal is also subluxed on the right foot. I have warned him today that he cannot consider wearing a prosthesis for at least a month but he can go for measurements. He is going to need to keep the right foot padded is much as possible in his diabetic shoe indefinitely READMISSION 06/12/2019 Mr. Zahniser is a type II diabetic on dialysis. He has been in this clinic multiple times with wounds on his bilateral lower extremities. 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 He also has a history of wounds on his right foot but tells me that everything is going well here. He actually came in here with his prosthesis walking with a cane. We were all really quite gratified to see this He states over the  last several weeks he has had a painful area at the tip of the left third finger. His dialysis shunt in his is in the left upper arm and this particularly hurts during dialysis when his fingers get numb and there is a lot of pain in the tip of his left third finger. I believe he is seen vascular surgery. He had a Doppler done to evaluate for a dialysis steal syndrome. He is going for a banding procedure 1 week from today towards the left AV fistula. I think if that is unsuccessful they will be looking at creating a new shunt. He had a course of doxycycline when he which he finished about a week ago. He has been using Bactroban to the  finger 6/25; the patient had his banding procedure and things seem to be going better. He is not having pain in his hand at dialysis. The area on the tip of his finger seems about closed. He did however have a paronychia I the last time I saw him. I have been advising him to use topical Bactroban washing the finger. Culture I did last time showed a few staph epidermidis which I was willing to dismiss as superficial skin contaminant. He arrives today with the finger wound looking better however the paronychia a seems worse 7/9; 2-week follow-up. He does not have an open wound on the tip of his third left finger. I thought he had an initial ischemic wound on the tip of the finger as well as a paronychia a medially. All of this appears to be closed. 8/6-Patient returns after 3 weeks for follow-up and has a right second met head plantar callus with a significant area of maceration hiding an ulcer ABI repeated today on right - 1.26 8/13-Patient returns at 1 week, the right second met head plantar wound appears to be slightly better, it is surrounded by the callus area we are using silver alginate 8/20; the patient came back to clinic 2 weeks ago with a wound on the right second metatarsal head. He apparently told me he developed callus in this area but also went away from  his custom shoes to a pair of running shoes. Using silver alginate. 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 There might be much we can do to offload this although the patient tells me he has a wheelchair that he is using to try and stay off the wound is much as possible 8/27; right second metatarsal head. Still small punched out area with thick callus and subcutaneous tissue around the wound. We have been using silver collagen. This is always been an issue with this man's wound especially on this foot 9/3; right second met head. Still the same punched-out area with thick callus and subcutaneous tissue around the wound removing the circumference demonstrates repetitively undermining area. I have removed all the subcutaneous tissue associated with this. We have been using silver collagen 9/15; right second metatarsal head. Same punched-out thick callus and subcutaneous tissue around the wound area. Still requiring debridement we have been using silver collagen I changed back to silver alginate X-ray last time showed no evidence of osteomyelitis. Culture showed Klebsiella and he was started and Keflex apparently just 4 days ago 9/24; right second metatarsal head. He is completed the antibiotics I gave him for 10 days. Same punched-out thick callus and subcutaneous tissue around the area. Still requiring debridement. We have been using silver alginate. The area itself looks swollen and this could be just [place] that comes on this area with walking on a prosthesis on the left however I think he needs an MRI and I am going to order that today. There is not an option to offload this further 10/1; right second plantar metatarsal head. MRI booked for October 6. Generally looking better today using silver alginate 10/8; right second plantar metatarsal head. MRI that was done on 10/6 was negative for osteomyelitis noted prior amputations of the second and fourth toes at the [facility].  Prior amputation of the third toe to the base of the middle phalanx. Prior amputation of the fifth toe to the head of the metatarsal. They also noted a partially non-united stress fracture at the base of the third  metatarsal. He does not recall about hearing about this. He did not have any pain but then again he has reduced sensation from diabetic neuropathy 10/15; right second plantar metatarsal head. Still in the same condition callus nonviable subcutaneous tissue which cleans up nicely with debridement but reforms by the next week. The patient tells me that he is offloading this is much as he can including taking his wheelchair to dialysis. We have been using silver alginate, changed to silver collagen today 10/22; right second plantar metatarsal head. Wound measures smaller but still thick callus around this wound. Still requiring debridement 11/5 right second plantar metatarsal head. Less callus around the wound circumference but still requiring debridement. There is still some undermining which I cleaned out the wound looks healthy but still considerable punched out depth with a relatively small circumference to the wound there is no palpable bone no purulent drainage 11/12; right second plantar metatarsal head. Small wound with thick callus tissue around the wound and significant undermining. We have been using silver collagen after my continuous debridements of this area 11/19; patient arrives in today with purulent drainage coming out of the wound in the second third met head area on the right foot. Serosanguineous thick drainage. Specimen obtained for culture. 11/21/2019 on evaluation today patient appears to be doing well with regard to his foot ulcer. The good news is is culture came back negative for any bacteria there was no growth. The other good news is his x-ray also appears to be doing well. The foot is also measuring much better than what it was. Overall I am very pleased with  how things seem to be progressing. 12/22; patient was in [facility] for several weeks due to the death of a family member. He said he stayed off the wound using silver alginate. 01/03/2020. Patient has a small open area with roughly 0.3 cm of circumferential undermining. The orifice looks smaller. We have been using silver collagen. There is not a wound more aggressive way to offload this area as he has a prosthesis on the other leg 1/21; again the same small area is 2 weeks ago. We have been using silver collagen I change that to endoform today I really believe that this is an offloading issue 2/4; the area on the right third met head. We have been using endoform. 2/11 the area on the right third met head we have been using silver alginate. Electronic Signature(s) Signed: 02/08/2020 1:06:39 PM By: Linton Ham MD Entered By: Linton Ham on 02/07/2020 13:11:45 -------------------------------------------------------------------------------- Physical Exam Details Patient Name: Date of Service: GAREK, SCHUNEMAN 02/07/2020 10:15 AM Medical Record [id number] Patient Account Number: [id number] Date of Birth/Sex: Treating RN: 11/14/1951 (69 y.o. [name] Primary Care Provider: [name] Other Clinician: Referring Provider: Treating Provider/Extender:Matej Sappenfield, Jeronimo Greaves, Buena Irish in Treatment: 34 Constitutional Sitting or standing Blood Pressure is within target range for patient.. Pulse regular and within target range for patient.Marland Kitchen Respirations regular, non-labored and within target range.. Temperature is normal and within the target range for the patient.Marland Kitchen Appears in no distress. Notes Wound exam; again thick nonviable callus skin and subcutaneous tissue around the wound margin. Still cleans up nicely but will not heal with the amount of pressure that is been put on this area. I am not sure that this is too much better than it was 2 to 3 weeks  ago. Electronic Signature(s) Signed: 02/08/2020 1:06:39 PM By: Linton Ham MD Entered By: Linton Ham on 02/07/2020 13:13:06 -------------------------------------------------------------------------------- Physician Orders  Details Patient Name: Date of Service: ZACKARIAH, VANDERPOL 02/07/2020 10:15 AM Medical Record [id number] Patient Account Number: [id number] Date of Birth/Sex: Treating RN: 24-Jan-1951 (69 y.o. 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 - Thursday Dressing Change Frequency Wound #41 Right Metatarsal head second Change Dressing every other day. Skin Barriers/Peri-Wound Care Barrier cream - as needed for maceration, wetness, or redness. Wound Cleansing May shower and wash wound with soap and water. Primary Wound Dressing Wound #41 Right Metatarsal head second Skin Substitute Application - wound center to [place]. Calcium Alginate with Silver - pack into wound bed. Secondary Dressing Wound #41 Right Metatarsal head second Foam - foam donut Kerlix/Rolled Gauze Dry Gauze Other: - felt to periwound and place one below wound. lightly wrap coban to hold dressing in place. felt / pad wound Edema Control Avoid standing for long periods of time Elevate legs to the level of the heart or above for 30 minutes daily and/or when sitting, a frequency of: - throughout the day. Off-Loading Other: - felt to periwound and place one below  wound. Additional Orders / Instructions Other: - minimize walking and standing on foot to aid in wound healing and callus buildup. Electronic Signature(s) Signed: 02/07/2020 5:57:11 PM By: Deon Pilling Signed: 02/08/2020 1:06:39 PM By: Linton Ham MD Entered By: Deon Pilling on 02/07/2020 11:26:04 -------------------------------------------------------------------------------- Problem List Details Patient Name: Date of Service: MOHMMAD, SALEEBY 02/07/2020 10:15 AM Medical Record [id number] Patient Account Number: [id number] Date of Birth/Sex: Treating RN: 06-18-1951 (69 y.o. 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, 06/12/2019 No Yes other extremity L03.115 Cellulitis of right lower limb 11/15/2019 No Yes Inactive Problems ICD-10 Code Description Active Date Inactive Date L03.114 Cellulitis of left upper limb 06/12/2019 06/12/2019 L98.498 Non-pressure chronic ulcer of skin of other sites with other 06/12/2019 06/12/2019 specified severity S61.209D Unspecified open wound of unspecified finger without damage 06/12/2019 06/12/2019 to nail, subsequent encounter Resolved Problems Electronic Signature(s) Signed: 02/08/2020 1:06:39 PM By: Linton Ham MD Entered By: Linton Ham on 02/07/2020 13:08:14 -------------------------------------------------------------------------------- Progress Note Details Patient Name: Date of Service: DANA, DORNER 02/07/2020 10:15 AM Medical Record [id number] Patient Account Number:  [id number] Date of Birth/Sex: Treating RN: 11/21/1951 (69 y.o. [name] Primary Care Provider: [name] Other Clinician: Referring Provider: Treating Provider/Extender:Azell Bill, Jeronimo Greaves, Buena Irish in Treatment: 34 Subjective History of Present Illness (HPI) The following HPI elements were documented for the patient's wound: Location: Patient presents with a wound to bilateral feet. Quality: Patient reports experiencing essentially no pain. Severity: Mildly severe wound with no evidence of infection Duration: Patient has had the wound for greater than 2 weeks prior to presenting for treatment The patient is a pleasant 69 yrs old bm here for evaluation of ulcers  on the plantar aspect of both feet. 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 He has a left arm graft for access. He has been trying to stay off his feet for weeks but does not seem to have any improvement in the wound. He has been seeing someone at the foot center and was referred to the Wound Care center for further evaluation. 12/30/15 the patient has 3 wounds one over the right first metatarsal head and 2 on the left foot at the left fifth and lleft first metatarsal head. All of these look relatively similar. The one over the left fifth probe to bone I could not prove that any of the others did. I note his MRI in November that did not show osteomyelitis. His peripheral pulses seem robust. All of these underwent surgical debridement to remove callus nonviable skin and subcutaneous tissue 01/06/16; the patient had his sutures removed from the right fifth ray amputation. There may be a small open part of this superiorly but otherwise the incision looks good. Areas over his right first, left first and left fifth metatarsal head all underwent surgical debridement as a varying degree of callus, skin and nonviable subcutaneous tissue. The area that is most worrisome  is the right fifth metatarsal head which has a wound probes precariously close to bone. There is no purulent drainage or erythema 01/13/16; I'm not exactly sure of the status of the right fifth ray amputation site however he follows with Dr. [name] later this week. The area over his right first plantar metatarsal head, left first and fifth plantar metatarsal head are all in the same status. Thick circumferential callus, nonviable subcutaneous tissue. Culture of the left fifth did not culture last week 01/20/16; all of the patient's wounds appear and roughly the same state although his amputation site on the right lateral foot looks better. His wounds over the right first, left first and left fifth metatarsal heads all underwent difficult surgical debridement removing circumferential callus nonviable skin and subcutaneous tissue. There is no overt evidence of infection in these areas. MRI at the end of December of the left foot did not show osteomyelitis, right foot showed osteomyelitis of the right fifth digit he is now status post amputation. 01/27/16 the patient's wounds over his plantar first and fifth metatarsal heads on the left all appear better having been started on a total contact cast last week. They were debridement of circumferential callus and nonviable subcutaneous tissue as was the wound over the first metatarsal head. His surgical incision on the right also had some light surface debridement done. 03/23/2016 -- the patient was doing really well and most of his wounds had almost completely healed but now he came back today with a history of having a discharge from the area of his right foot on the plantar aspect and also between his first and second toe. He also has had some discharge from the left foot. Addendum: I spoke to the PA [name] at the dialysis center whose fax number is [phone]. We discussed the infection the patient has and she will put the patient on  vancomycin and Fortaz until the final culture report is back. We will fax this as soon as available. 03/30/2016 -- left foot x-ray IMPRESSION:No definitive osteomyelitis noted. X-ray of the right foot -- IMPRESSION: 1. Soft tissue swelling. Prior amputation right fifth digit. No acute or focal bony abnormality identified. If osteomyelitis remains a clinical concern, MRI  can be obtained. 2. Peripheral vascular disease. His culture reports have grown an MSSA -- and we will fax this report to his hemodialysis center. He was called in a prescription of oral doxycycline but I have told her not to fill this in as he is already on IV antibiotics. 04/06/2016 -- a few days ago, I spoke to the hemodialysis center nurse who had stopped the IV antibiotics and he was given a prescription for doxycycline 100 mg by mouth twice a day for a week and he is on this at the present time. 05/11/2016 -- he has recently seen his PCP this week and his hemoglobin A1c was 7. He is working on his paperwork to get his orthotic shoes. 05/25/2016 -- -- x-ray of the left foot IMPRESSION:No acute bony abnormality. No radiographic changes of acute osteomyelitis. No change since prior study. X-ray of the right foot -- IMPRESSION: Postsurgical changes are seen involving the fifth toe. No evidence of acute osteomyelitis. he developed a large blister on the medial part of his right foot and this opened out and drain fluid. 06/01/2016 -- he has his MRI to be done this afternoon. 06/15/2016 -- MRI of the left forefoot without contrast shows 2 separate regions of cutaneous and subcutaneous edema and possible ulceration and blistering along the ball of the foot. No obvious osteomyelitis identified. MRI of the right foot showed cutaneous and subcutaneous thickening plantar to the first digit sesamoid with an ulcer crater but no underlying osteomyelitis is identified. 06/22/2016 -- the right foot plantar ulcer has been draining a lot of  seropurulent material for the last few days. 06/30/2016 -- spoke to the dialysis center and I believe I spoke to [place] a PA at the center who discussed with me and agreed to putting Legrand Como on vancomycin until his cultures arrive. On review of his culture report no WBCs were seen or no organisms were seen and the culture was reincubated for better growth. The final report is back and there were no predominant growth including Streptococcus or Staphylococcus. Clinically though he has a lot of drainage from both wounds a lot of undermining and there is further blebs on the left foot towards the interspace between his first and second toe. 08/10/2016 -- the culture from the right foot showed normal skin flora and there was no Staphylococcus aureus was group A streptococcus isolated. 09/21/2016 -- -- MRI of the right foot was done on 09/13/2016 - IMPRESSION: Findings most consistent with acute osteomyelitis throughout the great toe, sesamoid bones and plantar aspect of the head of the first metatarsal. Fluid in the sheath of the flexor tendon of the great toe could be sympathetic but is worrisome for septic tenosynovitis. First MTP joint effusion worrisome for septic joint. He was admitted to the hospital on 09/11/2016 and treated for a fever with vancomycin and cefepime. He was seen by Dr. Bobby Rumpf of infectious disease who recommended 6 weeks treatment with vancomycin and ceftazidime with his hemodialysis and to continue to see as in the wound clinic. Vascular consult was pending. The patient was discharged home on 09/14/2016 and he would continue with IV antibiotics for 6 weeks. 09/28/16 wound appears reasonably healthy. Continuing with total contact cast 10/05/16 wound is smaller and looking healthy. Continue with total contact cast. He continues on IV antibiotics 10/12/2016 -- he has developed a new wound on the dorsal aspect of his left big toe and this is a superficial injury with no  surrounding cellulitis. He has completed 30  days of IV antibiotics and is now ready to start his hyperbaric oxygen therapy as per his insurance company's recommendation. 10/19/2016 -- after the cast was removed on the right side he has got good resolution of his ulceration on the right plantar foot. he has a new wound on the left plantar foot in the region of his fourth metatarsal and this will need sharp debridement. 10/26/2016 -- Xray of the right foot complete: IMPRESSION: Changes consistent with osteomyelitis involving the head of the first metatarsal and base of the first proximal phalanx. The sesamoid bones are also likely involved given their positioning. 11/02/2016 -- there still awaiting insurance clearance for his hyperbaric oxygen therapy and hopefully he will begin treatment soon. 11/09/2016 -- started with hyperbaric oxygen therapy and had some barotrauma to the right ear and this was seen by ENT who was prescribed Afrin drops and would probably continue with HBO and he is scheduled for myringotomy tubes on Friday 11/16/2016 -- he had his myringotomy tubes placed on Friday and has been doing much better after that with some fluid draining out after hyperbaric treatment today. The pain was much better. 11/30/2016 -- over the last 2 days he noticed a swelling and change of color of his right second toe and this had been draining minimal fluid. 12/07/2016 -- x-ray of the right foot -- IMPRESSION:1. Progressive ulceration at the distal aspect of the second digit with significant soft tissue swelling and osseous changes in the distal phalanx compatible with osteomyelitis. 2. Chronic osteomyelitis at the first MTP joint. 3. Ulcerations at the second and third toes as well without definite osseous changes. Osteomyelitis is not excluded. 4. Fifth digit amputation. On 12/01/2016 oo I spoke to Dr. Bobby Rumpf, the infectious disease specialist, who kindly agreed to treat this with IV  antibiotics and he would call in the order to the dialysis center and this has been discussed in detail with the patient who will make the appropriate arrangements. The patient will also book an appointment as soon as possible to see Dr. [name] in the office. Of note the patient has not been on antibiotics this entire week as the dialysis center did not receive any orders from Dr. [name]. I got in touch with Dr. [name] who tells me the patient has an appointment to see him this coming Wednesday. 12/14/2016 -- the patient is on ceftazidime and vancomycin during his dialysis, and I understand this was put on by his nephrologist Dr. Raliegh Ip possibly after speaking with infectious disease Dr. [name] 12/23/16; patient was on my schedule today for a wound evaluation as he had difficulties with his schedule earlier this week. I note that he is on vancomycin and ceftazidine at dialysis. In spite of this he arrives today with a new wound on the base of the right second toe this easily probes to bone. The known wound at the tip of the second toe With this area. He also has a superficial area on the medial aspect of the third toe on the side of the DIP. This does not appear to have much depth. The area on the plantar left foot is a deep area but did not probe the bone 12/28/16 -- he has brought in some lab work and the most recent labs done showed a hemoglobin of 12.1 hematocrit of 36.3, neutrophils of 51% WBC count of 5.6, BN of 53, albumin of 4.1 globulin of 3.7, vancomycin 13 g per mL. 01/04/2017 -- he saw Dr. [name] who has recommended a  amputation of the right second toe and he is awaiting this date. He sees Dr. Bobby Rumpf of infectious disease tomorrow. The bone culture taken on 12/28/2016 had no growth in 2 days. 01/11/2017 -- was seen by Dr. Bobby Rumpf regarding the management and has recommended a eval by vascular surgeons. He recommended to continue the antibiotics during his  hemodialysis. Xray of the left foot -- IMPRESSION: No acute fracture, dislocation, or osseous erosion identified. 01/18/2017 -- he has his vascular workup later today and he is going to have his right foot second toe amputation this coming Friday by Dr. [name]. We have also put in for a another 30 treatments with hyperbaric oxygen therapy 01/25/2017 -- I have reviewed Dr. Donnetta Hutching is vascular report from last week where he reviewed him and thought his vascular function was good enough to heal his amputation site and no further tests were recommended. He also had his orthopedic related to surgery which is still pending the notes and we will review these next week. 02/01/2017 -- the operative remote of Dr. 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 02/08/2017 --he was seen by Dr. [name] on 02/03/2017 -- patient is on [place]. after a thorough review he had recommended to continue with antibiotics during hemodialysis. The patient was seen by Dr. [name], but I do not find any follow-up note on the electronic medical record. he has removed the dressing over the right foot to remove the sutures and asked him to see him back in a month's time 02/22/2017 -- patient has been febrile and has been having symptoms of the upper respiratory tract infection but has not been checked for the flu and it's been over 4 days now. He says he is feeling better today. Some drainage between his left first and second toe and this needed to be looked at 03/08/2017 -- was seen by Dr. Bobby Rumpf on 03/07/2017 after review he will stop his antibiotics and see  him back in a month to see if he is feeling well. 03/15/2017 -- he has completed his course of hyperbaric oxygen therapy and is doing fine with his health otherwise. 03/22/2017 -- his nutritionist at the dialysis center has recommended a protein supplement to help build his collagen and we will prescribe this for him when he has the details 05/03/2017 -- he recently noticed the area on the plantar aspect of his fifth metatarsal head which opened out and had minimal drainage. 05/10/2017 -- -- x-ray of the left foot -- IMPRESSION:1. No convincing conventional radiographic evidence of active osteomyelitis.2. Active soft tissue ulceration at the tip of the fifth digit, and at the lateral aspect of the foot adjacent the base of the fifth metatarsal. 3. Surgical changes of prior fourth toe amputation. 4. 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 5. Small vessel atherosclerotic vascular calcifications. 6. 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 ======= Readmission after 5 weeks: 06/15/2017 -- the patient returns after 5 weeks having had an MRI done on 05/17/2017 MRI of the left foot without contrast showed soft tissue ulcer overlying the base of the fifth metatarsal. Osteomyelitis  of the base of the fifth metatarsal along the lateral margin. No drainable fluid collection to suggest an abscess. He was in hospital between 05/16/2017 and 05/25/2017 -- and on discharge was asked to follow-up with Dr. [name] and Dr. [name]. He was treated 2 weeks post discharge with vancomycin plus ceftriaxone with hemodialysis and oral metronidazole 500 mg 3 times a day. 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 last hemoglobin A1c was 6.7% He had a postoperative MR of the foot on 05/23/2017 -- which showed no new areas of cortical bone loss,  edema or soft tissue ulceration to suggest osteomyelitis. the patient has completed his course of IV antibiotics during dialysis and is to see the infectious disease doctor tomorrow. Dr. [name] had seen him after suture removal and asked him to keep the wound with a dry dressing 06/21/2017 -- he was seen by Dr. Bobby Rumpf of infectious disease on 06/16/2017 -- he stopped his Flagyl and will see him back in 6 weeks. He was asked to follow-up with Dr. [name] and with the wound clinic clinic. 07/05/17; bone biopsy from last time showed acute osteomyelitis. This is from the reminiscent in the left fifth metatarsal. Culture result is apparently still pending [holding for anaerobe}. From my understanding in this case this is been a progressive necrotic wound which is deteriorated markedly over the last 3 weeks since he returned here. He now has a large area of exposed bone which was biopsied and cultured last week. Dr. Graylon Good has put him on vancomycin and Fortaz during his hemodialysis and Flagyl orally. He is to see Dr. [name] next week 07/19/2017 -- the patient was reviewed by Dr. [name] of orthopedics who reviewed the case in detail and agreed with the plan to continue with IV antibiotics, aggressive wound care and hyperbaric oxygen therapy. He would see him back in 3 weeks' time 08/09/2017 -- saw Dr. Bobby Rumpf on 08/03/2017 -he was restarted on his antibiotics for 6 weeks which included vanco, ceftaz and Flagyl. he recommended continuing his antibiotics for 6 weeks and reevaluate his completion date. He would continue with wound care and hyperbaric oxygen therapy. 08/16/2017 -- I understand he will be completing his 6 weeks of antibiotics sometime later this week. 09/19/2017 -- he has been without his wound VAC for the last week due to lack of supplies. He has been packing his wound with silver alginate. 10/04/2017 -- he has an appointment with Dr. [name] tomorrow and hence we will not  apply the wound VAC after his dressing changes today. 10/11/2017 -- he was seen by Dr. Bobby Rumpf on 10/05/2017, noted that the patient was currently off antibiotics, and after thorough review he recommended he follow-up with the wound care as per plan and no further antibiotics at this point. 11/29/17; patient is arrived for the wound on his left lateral foot.Marland Kitchen He is completed IV antibiotics and 2 rounds of hyperbaric oxygen for treatment of underlying osteomyelitis. He arrives today with a surface on most of the wound area however our intake nurse noted drainage from the superior aspect. This was brought to my attention. 12/13/2017 -- the right plantar foot had a large bleb and once the bleb was opened out a large callus and subcutaneous debris was removed and he has a plantar ulcer near the fourth metatarsal head 12/28/17 on evaluation today patient appears to be doing acutely worse in regard to his left foot. The wound  which has been appearing to do better as now open up more deeply there is bone palpable at the base of the wound unfortunately. He tells me that this feels "like it did when he had osteomyelitis previously" he also noted that his second toe on the left foot appears to be doing worse and is swollen there does appear to be some fluid collected underneath. His right foot plantar ulcer appears to be doing somewhat better at this point and there really is no complication at the site currently. No fevers, chills, nausea, or vomiting noted at this time. Patient states that he normally has no pain at this site however. Today he is having significant pain. 01/03/18; culture done last week showed methicillin sensitive staph aureus and group B strep. He was on [place] however he arrives today with a fever of 101. He has a functional dialysis shunt in his left arm but has had no pain here. No cough. He still makes some urine no dysuria he did have abdominal pain nausea and diarrhea over  the weekend but is not had any diarrhea since yesterday. Otherwise he has no specific complaints 01/05/18; the patient returns today in follow-up for his presentation from 1/8. At that point he was febrile. I gave him some Levaquin adjusted for his dialysis status. He tells me the fever broke that night and it is not likely that this was actually a wound infection. He is gone on to have an MRI of the left foot; this showed interval development of an abnormal signal from the base of the third and fourth metatarsal and in the cuboid reminiscent consistent with osteomyelitis. There is no mention of the left second toe I think which was a concern when it was ordered. The patient is taking his Levaquin 01/10/18; the patient has completed his antibiotics today. The area on the left lateral foot is smaller but it still probes easily to bone. He has underlying osteomyelitis here and sees Dr. Megan Salon of infectious disease this week ooThe area on the right third plantar metatarsal head still requires debridement not much change in dimensions [facility] has a new wound on the medial tip of his right third toe again this probes to bone. He only noticed this 2 days ago 01/17/18; the patient saw Dr. [name] last week and he is back on Vanco and Fortaz at dialysis. 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 We are previously seeing him for a wound also on the base of the fourth med head on the right. X- ray of this area did not show osteomyelitis in relation to a new wound on the tip of his right third toe. Again this probes to bone. Finally his second toe on the left which didn't really show anything on the MRI at least no report appears to have a separated cutaneous area which I think is going to come right off the tip of his toe and leave exposed bone. Whether this is infectious or ischemic I am not  clear Culture I did from the third toe last week which was his new wound was negative. Plain x-ray on the right foot did not show osteomyelitis in the toes 01/24/18; the patient is going went to see Dr. [name]. He is going to have a amputation of the left second and right third toe although paradoxically the left second toe looks better than  last week. We have treating a deep probing wound on the left lateral foot and the area over the third metatarsal head on the right foot. 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 Also a debridement including bone of the left lateral foot and a closure which is still sutured. This is a bit surprising. Also apparent debridement of the base of the right third toe wound. 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 I'm not sure exactly where we are in the frame of things here. The wound on the left lateral foot is still sutured also the amputation sites of the left second and right third toe. He is been using Xeroform to the sutured areas silver alginate to the right foot 02/14/18; he continues on Rocephin at dialysis for osteomyelitis. I still don't have a good sense of where we are in the treatment duration. The wounds on the left lateral foot had the sutures removed and this clearly still probes deeply. We debrided the area with a #5 curet. We'll use silver alginate to the wound oothe area over the right third met head also required debridement of callus skin and subcutaneous tissue and necrotic debris over the wound surface. This tunnels superiorly but I did not unroofed this today. ooThe areas on the tip of his toes have a crusted surface eschar I did not debridement this either. 02/21/18; he continues on Rocephin at dialysis for  osteomyelitis. I don't have a good sense of where we are and the treatment duration. The wounds on the left lateral foot is callused over on presentation but requires debridement. The area on the right third med head plantar aspect actually is measuring smaller 02/28/18;patient is on Vanco and Fortaz at dialysis, I'm not sure why I thought this was Rocephin above unless it is been changed. I don't have a good sense of time frame here. Using silver collagen to the area on the left lateral foot and right third med head plantar foot 03/07/18; patient is completing bank and Fortaz at dialysis soon. He is using Silver collagen to the area on the left lateral foot and the right third metatarsal head. He has a smaller superficial wound distal to the left lateral foot wound. 03/14/18-he is here in follow-up evaluation for multiple ulcerations to his bilateral feet. He presents with a new ulceration to the plantar aspect of the left foot underneath the blister, this was deroofed to reveal a partial thickness ulcer. He is voicing no complaints or concerns, tolerated dialysis yesterday. We will continue with same treatment plan and he will follow-up next week 03/21/18; the patient has a area on the lateral left foot which still has a small probing area. The overall surface area of the wound is better. He presented with a new ulceration on the left foot plantar fifth metatarsal head last week. The area on the right third metatarsal head appears smaller.using silver alginate to all wound areas. The patient is changing the dressing himself. He is using a [place] forefoot offloading are on the right and a healing sandal on the left 03/28/18; original wound left lateral foot. Still nowhere close to looking like a heeling surface oosecondary wound on the left lateral foot at the base of his question fifth metatarsal which was new  last week ooRight third metatarsal head. ooWe have been using silver alginate all  wounds 04/04/18; the original wound on the left lateral foot again heavy callus surrounding thick nonviable subcutaneous tissue all requiring debridement. The new wound from 2 weeks ago just near this at the base of the fifth metatarsal head still looks about the same ooUnfortunately there is been deterioration in the third medical head wound on the right which now probes to bone. I must say this was a superficial wound at one point in time and I really don't have a good frame of reference year. I'll have to go back to look through records about what we know about the right foot. 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 He is also been to see Dr. [name]. Somewhere in here as somebody is ordered a VASCULAR evaluation which was done on 03/22/18. On the right is anterior tibial artery was monophasic triphasic at the posterior tibial artery. On the left anterior tibial artery monophasic posterior tibial artery monophasic. ABI in the right was 1.43 on the left 1.21. TBIs on the right at 0.41 and on the left at 0.32. A vascular consult was recommended and I think has been arranged. 04/11/18; Mr. Madani has not had an MRI of the right foot since 2017. Recent x-ray of the right foot done in January and February was negative. However he has had a major deterioration in the wound over the third met head. He is completed antibiotics last week at dialysis [place]. I think he is going to see vascular in follow-up. The areas on the left lateral foot and left plantar fifth metatarsal head both look satisfactory. I debrided both of these areas although the tissue here looks good. The area on the left lateral foot once probe to bone it certainly does not do that now 04/18/18; MRI of the right foot is on Thursday. He has what looks to be serosanguineous purulent drainage coming out of the wound over the right  third metatarsal head today. He completed antibiotics bank and Fortaz 2 weeks ago at dialysis. He has a vascular follow-up with regards to his arterial insufficiency although I don't exactly see when that is booked. The areas on the left foot including the lateral left foot and the plantar left fifth metatarsal head look about the same. 04/25/18-He is here in follow-up evaluation for bilateral foot ulcers. MRI obtained was negative for osteomyelitis. Wound culture was negative. We will continue with same treatment plan he'll follow-up next week 05/02/18; the right plantar foot wound over the third metatarsal head actually looks better than when I last saw this. His MRI was negative for osteomyelitis wound culture was negative. ooOn the left plantar foot both wounds on the plantar fifth metatarsal head and on the lateral foot both are covered in a very hard circumferential callus. 05/09/18; right plantar foot wound over the third metatarsal head stable from last week. ooLeft plantar foot wound over the fifth metatarsal head also stable but with callus around both wound areas ooThe area on the left lateral foot had thick callus over a surface and I had some thoughts about leaving this intact however it felt boggy. ooWe've been using silver alginate all wounds 05/16/18; since the patient was last here he was hospitalized from 5/15 through 5/19. 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 This grew MRSA. He was placed on vancomycin.MRI of the left foot suggested "progressive" osteomyelitis and bone destruction of the cuboid and the base of the fifth metatarsal. Stable erosive changes of the base of the second metatarsal. I'm wondering if they're aware that he had surgery and debridement in the area of the underlying bone previously by Dr. [name]. In any case, He was also revascularized with an  angioplasty and stenting of the left tibial peroneal trunk and angioplasty of the left posterior tibial artery. He is on vancomycin at dialysis. He has a 2 week follow-up with infectious disease. He has vascular surgery follow-up. He was started on Plavix. 05/23/18; the patient's wound on the lateral left foot at the level of the fifth metatarsal head and the plantar wound on the plantar fifth metatarsal head both look better. The area on the right third metatarsal head still has depth and undermining. We've been using silver alginate. He is on vancomycin. He has been revascularized on the left 05/30/18; he continues on vancomycin at dialysis. Revascularized on the left. The area on the left lateral foot is just about closed. Unfortunately the area over the plantar fifth metatarsal head undermining superiorly as does the area over the right third metatarsal head. Both of these significantly deteriorated from last week. He has appointments next week with infectious disease and orthopedics I delayed putting on a cast on the left until those appointments which therefore we made we'll bring him in on Friday the 14th with the idea of a cast on the left foot 06/06/18; he continues on vancomycin at dialysis. Revascularized on the left. He arrives today with a ballotable swelling just above the area on the left lateral foot where his previous wound was. He has no pain but he is reasonably insensate. The difficult areas on the plantar left fifth metatarsal head looks stable whereas the area on the right third plantar metatarsal head about the same as last week there is undermining here although I did not unroofed this today. He required a considerable debridement of the swelling on the left lateral foot area and I unroofed an abscess with a copious amount of brown purulent material which I obtained for culture. I don't believe during his recent hospitalization he had any further imaging although I need to review  this. Previous cultures from this area done in this clinic showed MRSA, I would be surprised if this is not what this is currently even though he is on vancomycin. 06/13/18; he continues on vancomycin at dialysis however he finishes this on Sunday and then graduates the doxycycline previously prescribed by Dr. [name]. The abscess site on the lateral foot that I unroofed last time grew moderate amounts of methicillin-resistant staph aureus that is both vancomycin and tetracycline sensitive so we should be okay from that regard. He arrives today in clinic with a connection between the abscess site and the area on the lateral foot. We've been using silver alginate to all his wound areas. The right third metatarsal head wound is measuring smaller. Using silver alginate on both wound areas 06/20/18; transitioning to doxycycline prescribed by Dr. [name]. The abscess site on the lateral foot that I unroofed 2 weeks ago grew MRSA. That area has largely closed down although the lateral part of his wound on the fifth metatarsal head still probes to bone. I suspect all of this was connected. ooOn the right third metatarsal head smaller looking wound but surrounded  by nonviable tissue that once again requires debridement using silver alginate on both areas 06/27/18; on doxycycline 100 twice a day. The abscess on the lateral foot has closed down. Although he still has the wound on the left fifth metatarsal head that extends towards the lateral part of the foot. The most lateral part of this wound probes to bone ooRight third metatarsal head still a deep probing wound with undermining. Nevertheless I elected not to debridement this this week ooIf everything is copious static next week on the left I'm going to attempt a total contact cast 07/04/18; he is tolerating his doxycycline. The abscess on the left lateral foot is closed down although there is still a deep wound here that probes to bone. We will use  silver collagen under the total contact cast Silver collagen to the deep wound over the right third metatarsal head is well 07/11/18; he is tolerating doxycycline. The left lateral fifth plantar metatarsal head still probes deeply but I could not probe any bone. We've been using silver collagen and this will be the second week under the total contact cast ooSilver collagen to deep wound over the right third metatarsal head as well 07/18/18; he is still taking doxycycline. The left lateral fifth plantar metatarsal this week probes deeply with a large amount of exposed bone. Quite a deterioration. He required extensive debridement. Specimens of bone for pathology and CNS obtained ooAlso considerable debridement on the right third metatarsal head 07/25/18; bone for pathology last week from the lateral left foot showed osteomyelitis. CandS of the bone Enterobacter. And Klebsiella. He is now on ciprofloxacin and the doxycycline is stopped [Dr. [name] of infectious disease] area He has an appointment with Dr. [name] tomorrow I'm going to leave the total contact cast off for this week. May wish to try to reapply that next week or the week after depending on the wound bed looks. I'm not sure if there is an operative option here, previously is followed with Dr. [name] 08/01/18 on evaluation today patient presents for reevaluation. He has been seen by infectious disease, Dr. [name], and he has placed the patient back on Ceftaz currently to be given to him at dialysis three times a week. 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 Fortunately there does not appear to be the evidence of infection at this point in time. Overall the patient has been tolerating the dressing changes without complication. We are no longer due lives in the cast of left foot secondary to the infection obviously. 08/10/18 on evaluation today patient appears to be  doing rather well all things considered in regard to his left plantar foot ulcer. He does have a significant callous on the lateral portion of his left foot he wonders if I can help clean that away to some degree today. Fortunately he is having no evidence of infection at this time which is good news. No fevers chills noted. With that being said his right plantar foot actually does have a significant callous buildup around the wound opening this seems to not be doing as well as I would like at this point. I do believe that he would benefit from sharp debridement at this site. Subsequently I think a total contact cast would be helpful for the right foot as well. 08/17/18; the patient arrived today with a total contact cast on the right foot. This actually looks quite good. He had gone to see  Dr. Megan Salon of infectious disease about the osteomyelitis on the left foot. I think he is on IV Fortaz at dialysis although I am not exactly sure of the rationale for the Fortaz at the time of this dictation. He also arrived in today with a swelling on the lateral left foot this is the site of his original wounds in fact when I first saw this man when he was under Dr. Ardeen Garland care I think this was the site of where the wound was located. It was not particularly tender however using a small scalpel I opened it to [place] some moderate amount of purulent drainage. 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 Last MRI was in May that showed progressive osteomyelitis and bone destruction of the cuboid and base of the fifth metatarsal. Last week he had an abscess over this same area this was removed. Culture was negative. ooWe are continuing with a total contact cast to the area on the third metatarsal head on the right and making some good progress here. The patient asked me again about  renal transplant. He is not on the list because of the open wounds. 08/31/18; apparently the patient had an interruption in the [place] but he is now back on this at dialysis. Apparently there was a misunderstanding and Dr. Crissie Figures orders. Due to have the MRI of the left foot tonight. ooThe area on the right foot continues to have callus thick skin and subcutaneous tissue around the wound edge that requires constant debridement however the wound is smaller we are using a total contact cast in this area. Alginate all wounds 09/07/18; without much surprise the MRI of his left foot showed osteomyelitis in the reminiscent of his fourth metatarsal but also the third metatarsal. She arrives in clinic today with the right foot and a total contact cast. There was purulent drainage coming out of the wound which I have cultured. Marked deterioration here with undermining widely around the wound orifice. Using pickups and a scalpel I remove callus and subcutaneous tissue from a substantial new opening. ooIn a similar fashion the area on the left lateral foot that was blistered last week I have opened this and remove skin and subcutaneous tissue from this area to expose a obvious new wound. I think there is extension and communication between all of this on the left foot. ooThe patient is on Fortaz at dialysis. Culture done of the right foot. We are clearly not making progress here. ooWe have made him an appointment with Dr. [name] of orthopedics. I think an amputation of the left leg may be discussed. I don't think there is anything that can be done with foot salvage. 09/14/18; culture from the right foot last time showed a very resistant MRSA. This is resistant to doxycycline. I'm going to try to get linezolid at least for a week. He does not have a appointment with Dr. [name] yet. 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 I'll send a message to  Dr. [name] of infectious disease. I want the patient to see Dr. [name] to go over the pros and cons of an amputation which I think will be a BKA. The patient had a question about whether this is curative or not. I told him that I thought it would be although spread of staph  aureus infection is not unheard of. MRI of the right foot was done at the end of April 2019 did not show osteomyelitis. The patient last saw Dr. [name] on 9/3. I'll send Dr. [name] a message. I would really like him to weigh the pros and cons of a BKA on the left otherwise he'll probably need IV antibiotics and perhaps hyperbaric oxygen again. He has not had a good response to this in the past. His MRI earlier this month showed progressive damage in the remnants of the fourth and third metatarsal 09/21/2018; sees Dr. [name] of orthopedics next week to discuss a left BKA in response to the osteomyelitis in the left foot. Using silver alginate to all wound areas. He is completing the Zyvox I did put in for him after last culture showed MRSA 09/29/2018; patient saw Dr. [name] of orthopedics discussed the osteomyelitis in the left foot third and fourth metatarsals. He did not recommend urgent surgery but certainly stated the only surgical option would be a BKA. He is communicating with Dr. [name]. The problem here is the instability of the areas on his foot which constantly generate draining abscesses. I will communicate with Dr. [name] about this. He has an appointment on 10/23 10/05/2018; patient sees Dr. [name] on 10/23. I have sent him a secure message not ordered any additional antibiotics for now. Patient continues to have 2 open areas on the left plantar foot at the fifth metatarsal head and the lateral aspect of the foot. These have not changed all that much. The area on the right third met head still has thick callus and surrounding subcutaneous tissue we have been using silver alginate 10/12/2018; patient sees Dr.  [name] or colleague on 10/23. 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 The area on the right plantar foot is just about closed. The superior wound on the left fifth metatarsal head is callused over but I am not sure if this is closed. The area below it is about the same. Considerable amount of callus on the lateral foot. We have been using silver alginate to the wounds 10/19/2018; the patient saw Dr. [name] on 10/22. He wishes to try and continue to save the left foot. He has been given 6 weeks of oral ciprofloxacin. We continue to put a total contact cast on the right foot. The area over the fifth metatarsal head on the left is callused over/perhaps healed but I did not remove the callus to find out. He still has the wound on the left lateral midfoot requiring debridement. We have been using silver alginate to all wound areas 10/26/2018 ooOn the left foot our intake nurse noted some purulent drainage from the inferior wound [currently the only one that is not callused over] ooOn the right foot even though he is in total contact cast a considerable amount of thick black callus and surface eschar. On this side we have been using silver alginate under a total contact cast. oohe remains on ciprofloxacin as prescribed by Dr. [name] 11/02/2018; culture from last week which is done of the probing area on the left midfoot wound showed MRSA "few". I am going to need to contact Dr. [name] which I will do today I think he is going to need IV antibiotics again. The area on the left foot which was callused on the side is clearly separating today all of this was removed a copious amounts of callus and necrotic subcutaneous tissue.   ooThe area on the right plantar foot actually remained healthy looking with a healthy granulated base. ooWe are using silver alginate to all wound areas 11/09/2018; unfortunately neither 1 of the patient's  wounds areas looks at all satisfactory. On the left he has considerable necrotic debris from the plantar wound laterally over the foot. I removed copious amounts of material including callus skin and subcutaneous tissue. On the right foot he arrives with undermining and frankly purulent drainage. Specimen obtained for culture and debridement of the callused skin and subcutaneous tissue from around the circumference. Because of this I cannot put him back in a total contact cast but to be truthful we are really unfortunately not making a lot of progress I did put in a secure text message to Dr. [name] wondering about the MRSA on the left foot. He suggested vancomycin although this is not started. I was left wondering if he expected me to send this into dialysis. Has we have more purulence on the right side wait for that result before calling dialysis 11/16/2018; I called dialysis earlier this week to get the vancomycin started. I think he got the first dose on Tuesday. The patient tells me that he fell earlier this week twisting his left foot and ankle and he is swelling. He continues to not look well. He had blood cultures done at dialysis on Tuesday he is not been informed of the results. 12/07/2018; the patient was admitted to hospital from 11/22/2018 through 12/02/2018. He underwent a left BKA. Apparently had staph sepsis. In the meantime being off the right foot this is closed over. He follows up with Dr. 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 Subsequent to this he actually had multiple falls in fact he tells me to following which calls the wound to be his apparently. He has been seeing Dr. [name] and his physician assistant in the interim. They actually did want him to come back for reevaluation to see if there's anything we can do for me wound care perspective the help  this area to heal more appropriately. The patient states that he does not have a tremendous amount of pain which is good news. No fevers, chills, nausea, or vomiting noted at this time. We have gotten approval from Dr. [name] office had [place] for [place] to treat the patient for his stop wound. This is due to the fact that the patient was in the 90 day postop global. 1/21; the patient does not have an open wound on the right foot although he does have some pressure areas that will need to be padded when he is transferring. The dehisced surgical wound looks clean although there is some undermining of 1.5 cm superiorly. 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 This appears healthy but there is undermining. TheraSkin #1 2/11; TheraSkin #2 2/25; TheraSkin #3. Wound is measuring smaller 3/10; TheraSkin #4 wound is measuring smaller 3/24; TheraSkin #5 wound is measuring smaller and looks healthy. 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 He has no open area on the right foot although he does have some callused area over the original third metatarsal head wound. The third metatarsal is also subluxed on the right foot. I have warned him today that he cannot consider wearing a prosthesis for at least a  month but he can go for measurements. He is going to need to keep the right foot padded is much as possible in his diabetic shoe indefinitely READMISSION 06/12/2019 Mr. Neils is a type II diabetic on dialysis. He has been in this clinic multiple times with wounds on his bilateral lower extremities. 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 He also has a history of wounds on his right foot but tells me that everything is going well here. He actually came in here with his prosthesis walking  with a cane. We were all really quite gratified to see this He states over the last several weeks he has had a painful area at the tip of the left third finger. His dialysis shunt in his is in the left upper arm and this particularly hurts during dialysis when his fingers get numb and there is a lot of pain in the tip of his left third finger. I believe he is seen vascular surgery. He had a Doppler done to evaluate for a dialysis steal syndrome. He is going for a banding procedure 1 week from today towards the left AV fistula. I think if that is unsuccessful they will be looking at creating a new shunt. He had a course of doxycycline when he which he finished about a week ago. He has been using Bactroban to the finger 6/25; the patient had his banding procedure and things seem to be going better. He is not having pain in his hand at dialysis. The area on the tip of his finger seems about closed. He did however have a paronychia I the last time I saw him. I have been advising him to use topical Bactroban washing the finger. Culture I did last time showed a few staph epidermidis which I was willing to dismiss as superficial skin contaminant. He arrives today with the finger wound looking better however the paronychia a seems worse 7/9; 2-week follow-up. He does not have an open wound on the tip of his third left finger. I thought he had an initial ischemic wound on the tip of the finger as well as a paronychia a medially. All of this appears to be closed. 8/6-Patient returns after 3 weeks for follow-up and has a right second met head plantar callus with a significant area of maceration hiding an ulcer ABI repeated today on right - 1.26 8/13-Patient returns at 1 week, the right second met head plantar wound appears to be slightly better, it is surrounded by the callus area we are using silver alginate 8/20; the patient came back to clinic 2 weeks ago with a wound on the right second metatarsal head.  He apparently told me he developed callus in this area but also went away from his custom shoes to a pair of running shoes. Using silver alginate. 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 There might be much we can do to offload this although the patient tells me he has a wheelchair that he is using to try and stay off the wound is much as possible 8/27; right second metatarsal head. Still small punched out area with thick callus and subcutaneous tissue around the wound. We have been using silver collagen. This is always been an issue with this man's wound especially on this foot 9/3; right second met head. Still the same punched-out area  with thick callus and subcutaneous tissue around the wound removing the circumference demonstrates repetitively undermining area. I have removed all the subcutaneous tissue associated with this. We have been using silver collagen 9/15; right second metatarsal head. Same punched-out thick callus and subcutaneous tissue around the wound area. Still requiring debridement we have been using silver collagen I changed back to silver alginate X-ray last time showed no evidence of osteomyelitis. Culture showed Klebsiella and he was started and Keflex apparently just 4 days ago 9/24; right second metatarsal head. He is completed the antibiotics I gave him for 10 days. Same punched-out thick callus and subcutaneous tissue around the area. Still requiring debridement. We have been using silver alginate. The area itself looks swollen and this could be just [place] that comes on this area with walking on a prosthesis on the left however I think he needs an MRI and I am going to order that today. There is not an option to offload this further 10/1; right second plantar metatarsal head. MRI booked for October 6. Generally looking better today using silver alginate 10/8; right second plantar metatarsal head. MRI that was done on 10/6 was negative for  osteomyelitis noted prior amputations of the second and fourth toes at the [facility]. Prior amputation of the third toe to the base of the middle phalanx. Prior amputation of the fifth toe to the head of the metatarsal. 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 He does not recall about hearing about this. He did not have any pain but then again he has reduced sensation from diabetic neuropathy 10/15; right second plantar metatarsal head. Still in the same condition callus nonviable subcutaneous tissue which cleans up nicely with debridement but reforms by the next week. The patient tells me that he is offloading this is much as he can including taking his wheelchair to dialysis. We have been using silver alginate, changed to silver collagen today 10/22; right second plantar metatarsal head. Wound measures smaller but still thick callus around this wound. Still requiring debridement 11/5 right second plantar metatarsal head. Less callus around the wound circumference but still requiring debridement. There is still some undermining which I cleaned out the wound looks healthy but still considerable punched out depth with a relatively small circumference to the wound there is no palpable bone no purulent drainage 11/12; right second plantar metatarsal head. Small wound with thick callus tissue around the wound and significant undermining. We have been using silver collagen after my continuous debridements of this area 11/19; patient arrives in today with purulent drainage coming out of the wound in the second third met head area on the right foot. Serosanguineous thick drainage. Specimen obtained for culture. 11/21/2019 on evaluation today patient appears to be doing well with regard to his foot ulcer. The good news is is culture came back negative for any bacteria there was no growth. The other good news is his x-ray also appears to be doing well. The foot  is also measuring much better than what it was. Overall I am very pleased with how things seem to be progressing. 12/22; patient was in [facility] for several weeks due to the death of a family member. He said he stayed off the wound using silver alginate. 01/03/2020. Patient has a small open area with roughly 0.3 cm of circumferential undermining. The orifice looks smaller. We have been using silver collagen. There is not a wound more aggressive way to offload this  area as he has a prosthesis on the other leg 1/21; again the same small area is 2 weeks ago. We have been using silver collagen I change that to endoform today I really believe that this is an offloading issue 2/4; the area on the right third met head. We have been using endoform. 2/11 the area on the right third met head we have been using silver alginate. Objective Constitutional Sitting or standing Blood Pressure is within target range for patient.. Pulse regular and within target range for patient.Marland Kitchen Respirations regular, non-labored and within target range.. Temperature is normal and within the target range for the patient.Marland Kitchen Appears in no distress. Vitals Time Taken: 10:14 AM, Height: 73 in, Weight: 202 lbs, BMI: 26.6, Temperature: 99.0 F, Pulse: 62 bpm, Respiratory Rate: 18 breaths/min, Blood Pressure: 102/54 mmHg, Capillary Blood Glucose: 142 mg/dl. General Notes: Wound exam; again thick nonviable callus skin and subcutaneous tissue around the wound margin. Still cleans up nicely but will not heal with the amount of pressure that is been put on this area. I am not sure that this is too much better than it was 2 to 3 weeks ago. Integumentary (Hair, Skin) Wound #41 status is Open. Original cause of wound was Gradually Appeared. The wound is located on the Right Metatarsal head second. The wound measures 0.5cm length x 0.5cm width x 1cm depth; 0.196cm^2 area and 0.196cm^3 volume. There is Fat Layer (Subcutaneous Tissue)  Exposed exposed. There is no tunneling noted, however, there is undermining starting at 12:00 and ending at 12:00 with a maximum distance of 0.5cm. There is a medium amount of serosanguineous drainage noted. The wound margin is well defined and not attached to the wound base. There is large (67-100%) pink, pale granulation within the wound bed. There is a small (1-33%) amount of necrotic tissue within the wound bed including Adherent Slough. 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 There was a Excisional Skin/Subcutaneous Tissue Debridement with a total area of 1 sq cm performed by Ricard Dillon., MD. With the following instrument(s): Curette to remove Viable and Non-Viable tissue/material. Material removed includes Callus, Subcutaneous Tissue, Skin: Dermis, and Fibrin/Exudate after achieving pain control using Lidocaine 4% Topical Solution. A time out was conducted at 11:18, prior to the start of the procedure. A Moderate amount of bleeding was controlled with Pressure. The procedure was tolerated well with a pain level of 0 throughout and a pain level of 0 following the procedure. Post Debridement Measurements: 0.5cm length x 0.5cm width x 1cm depth; 0.196cm^3 volume. Character of Wound/Ulcer Post Debridement is improved. Severity of Tissue Post Debridement is: Fat layer exposed. Post procedure Diagnosis Wound #41: Same as Pre-Procedure Plan Follow-up Appointments: Return Appointment in 1 week. - Thursday Dressing Change Frequency: Wound #41 Right Metatarsal head  second: Change Dressing every other day. Skin Barriers/Peri-Wound Care: Barrier cream - as needed for maceration, wetness, or redness. Wound Cleansing: May shower and wash wound with soap and water. Primary Wound Dressing: Wound #41 Right Metatarsal head second: Skin Substitute Application - wound center to [place]. Calcium Alginate with Silver - pack into wound bed. Secondary  Dressing: Wound #41 Right Metatarsal head second: Foam - foam donut Kerlix/Rolled Gauze Dry Gauze Other: - felt to periwound and place one below wound. lightly wrap coban to hold dressing in place. felt / pad wound Edema Control: Avoid standing for long periods of time Elevate legs to the level of the heart or above for 30 minutes daily and/or when sitting, a frequency of: - throughout the day. Off-Loading: Other: - felt to periwound and place one below wound. Additional Orders / Instructions: Other: - minimize walking and standing on foot to aid in wound healing and callus buildup. 1. Continue with silver alginate with a foam doughnut. 2. I have asked him to try to offload this is much as possible even at dialysis to perhaps go in with a wheelchair. 3. I do not see any evidence that this is infected. It goes nowhere close to bone. All the tissue here post debridement looks healthy Electronic Signature(s) Signed: 02/08/2020 1:06:39 PM By: Linton Ham MD Entered By: Linton Ham on 02/07/2020 13:14:07 -------------------------------------------------------------------------------- SuperBill Details Patient Name: Date of Service: NYAIRE, DENBLEYKER 02/07/2020 Medical Record [id number] Patient Account Number: [id number] Date of Birth/Sex: Treating RN: 05/23/1951 (69 y.o. [name], Meta.Reding Primary Care Provider: [name] Other Clinician: Referring Provider: Treating Provider/Extender:Fatma Rutten, Jeronimo Greaves, Buena Irish in Treatment: 34 Diagnosis Coding ICD-10 Codes Code  Description E11.621 Type 2 diabetes mellitus with foot ulcer L97.512 Non-pressure chronic ulcer of other part of right foot with fat layer exposed I70.298 Other atherosclerosis of native arteries of extremities, other extremity L03.115 Cellulitis of right lower limb Facility Procedures The patient participates with Medicare or their insurance follows the Medicare Facility Guidelines: CPT4 Code Description Modifier Quantity [id number] - DEB SUBQ TISSUE 20 SQ CM/< 1 ICD-10 Diagnosis Description E11.621 Type 2 diabetes mellitus with  foot ulcer L97.512 Non-pressure chronic ulcer of other part of right foot with fat layer exposed Physician Procedures CPT4 Code Description: [id number] - WC PHYS SUBQ TISS 20 SQ CM ICD-10 Diagnosis Description E11.621 Type 2 diabetes mellitus with foot ulcer L97.512 Non-pressure chronic ulcer of other part of right foot with Modifier: fat layer e Quantity: 1 xposed Electronic Signature(s) Signed: 02/08/2020 1:06:39 PM By: Linton Ham MD Entered By: Linton Ham on 02/07/2020 13:14:29

## 2020-02-14 ENCOUNTER — Encounter (HOSPITAL_BASED_OUTPATIENT_CLINIC_OR_DEPARTMENT_OTHER): Payer: Medicare Other | Admitting: Internal Medicine

## 2020-02-21 ENCOUNTER — Encounter (HOSPITAL_BASED_OUTPATIENT_CLINIC_OR_DEPARTMENT_OTHER): Payer: Medicare Other | Admitting: Internal Medicine

## 2020-02-21 ENCOUNTER — Other Ambulatory Visit: Payer: Self-pay

## 2020-02-21 DIAGNOSIS — E11621 Type 2 diabetes mellitus with foot ulcer: Secondary | ICD-10-CM | POA: Diagnosis not present

## 2020-02-21 NOTE — Progress Notes (Signed)
[name], [name] ([id number]) Visit Report for 02/21/2020 Debridement Details Patient Name: Date of Service: [name], [name] 02/21/2020 1:45 PM Medical Record [id number] Patient Account Number: [id number] Date of Birth/Sex: [birth year] (68 y.o. M) Treating RN: Deon Pilling Primary Care Provider: [name] Other Clinician: Referring Provider: Treating Provider/Extender:Tyreisha Ungar, Birdena Crandall in Treatment: 36 Debridement Performed for Wound #41 Right Metatarsal head second Assessment: Performed By: Physician Ricard Dillon., MD Debridement Type: Debridement Severity of Tissue Pre Fat layer exposed Debridement: Level of Consciousness (Pre- Awake and Alert procedure): Pre-procedure Verification/Time Out Taken: Yes - 14:25 Start Time: 14:26 Pain Control: Lidocaine 4% Topical Solution Total Area Debrided (L x W): 0.5 (cm) x 0.5 (cm) = 0.25 (cm) Tissue and other material Viable, Non-Viable, Callus, Subcutaneous, Skin: Dermis , Fibrin/Exudate debrided: Level: Skin/Subcutaneous Tissue Debridement Description: Excisional Instrument: Curette Bleeding: Minimum Hemostasis Achieved: Pressure End Time: 14:30 Procedural Pain: 0 Post Procedural Pain: 0 Response to Treatment: Procedure was tolerated well Level of Consciousness Awake and Alert (Post-procedure): Post Debridement Measurements of Total Wound Length: (cm) 0.3 Width: (cm) 0.3 Depth: (cm) 0.8 Volume: (cm) 0.057 Character of Wound/Ulcer Post Improved Debridement: Severity of Tissue Post Debridement: Fat layer exposed Post Procedure Diagnosis Same as Pre-procedure Electronic Signature(s) Signed: 02/21/2020 5:41:09 PM By: Linton Ham MD Signed: 02/21/2020 6:07:18 PM By: Deon Pilling Entered By: Linton Ham on 02/21/2020 14:44:30 -------------------------------------------------------------------------------- HPI Details Patient Name: Date of Service: [name], [name] 02/21/2020 1:45  PM Medical Record [id number] Patient Account Number: [id number] Date of Birth/Sex: Treating RN: 1951-12-24 (69 y.o. [name] Primary Care Provider: [name] Other Clinician: Referring Provider: Treating Provider/Extender:Rheagan Nayak, Jeronimo Greaves, Buena Irish in Treatment: 36 History of Present Illness Location: Patient presents with a wound to bilateral feet. Quality: Patient reports experiencing essentially no pain. Severity: Mildly severe wound with no evidence of infection Duration: Patient has had the wound for greater than 2 weeks prior to presenting for treatment HPI Description: The patient is a pleasant 69 yrs old bm here for evaluation of ulcers on the plantar aspect of both feet. 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 He has a left arm graft for access. He has been trying to stay off his feet for weeks but does not seem to have any improvement in the wound. He has been seeing someone at the foot center and was referred to the Wound Care center for further evaluation. 12/30/15 the patient has 3 wounds one over the right first metatarsal head and 2 on the left foot at the left fifth and lleft first metatarsal head. All of these look relatively similar. The one over the left fifth probe to bone I could not prove that any of the others did. I note his MRI in November that did not show osteomyelitis. His peripheral pulses seem robust. All of these underwent surgical debridement to remove callus nonviable skin and subcutaneous tissue 01/06/16; the patient had his sutures removed from the right fifth ray amputation. There may be a small open part of this superiorly but otherwise the incision looks good. Areas over his right first, left first and left fifth metatarsal head all underwent surgical debridement as a varying degree of callus, skin and nonviable subcutaneous tissue. The area that is most worrisome is the  right fifth metatarsal head which has a wound probes precariously close to bone. There is no purulent drainage or erythema 01/13/16; I'm not exactly sure of the status of the right fifth  ray amputation site however he follows with Dr. [name] later this week. The area over his right first plantar metatarsal head, left first and fifth plantar metatarsal head are all in the same status. Thick circumferential callus, nonviable subcutaneous tissue. Culture of the left fifth did not culture last week 01/20/16; all of the patient's wounds appear and roughly the same state although his amputation site on the right lateral foot looks better. His wounds over the right first, left first and left fifth metatarsal heads all underwent difficult surgical debridement removing circumferential callus nonviable skin and subcutaneous tissue. There is no overt evidence of infection in these areas. MRI at the end of December of the left foot did not show osteomyelitis, right foot showed osteomyelitis of the right fifth digit he is now status post amputation. 01/27/16 the patient's wounds over his plantar first and fifth metatarsal heads on the left all appear better having been started on a total contact cast last week. They were debridement of circumferential callus and nonviable subcutaneous tissue as was the wound over the first metatarsal head. His surgical incision on the right also had some light surface debridement done. 03/23/2016 -- the patient was doing really well and most of his wounds had almost completely healed but now he came back today with a history of having a discharge from the area of his right foot on the plantar aspect and also between his first and second toe. He also has had some discharge from the left foot. Addendum: I spoke to the PA [name] at the dialysis center whose fax number is [phone]. We discussed the infection the patient has and she will put the patient on vancomycin  and Fortaz until the final culture report is back. We will fax this as soon as available. 03/30/2016 -- left foot x-ray IMPRESSION:No definitive osteomyelitis noted. X-ray of the right foot -- IMPRESSION: 1. Soft tissue swelling. Prior amputation right fifth digit. No acute or focal bony abnormality identified. If osteomyelitis remains a clinical concern, MRI can be obtained. 2. Peripheral vascular disease. His culture reports have grown an MSSA -- and we will fax this report to his hemodialysis center. He was called in a prescription of oral doxycycline but I have told her not to fill this in as he is already on IV antibiotics. 04/06/2016 -- a few days ago, I spoke to the hemodialysis center nurse who had stopped the IV antibiotics and he was given a prescription for doxycycline 100 mg by mouth twice a day for a week and he is on this at the present time. 05/11/2016 -- he has recently seen his PCP this week and his hemoglobin A1c was 7. He is working on his paperwork to get his orthotic shoes. 05/25/2016 -- -- x-ray of the left foot IMPRESSION:No acute bony abnormality. No radiographic changes of acute osteomyelitis. No change since prior study. X-ray of the right foot -- IMPRESSION: Postsurgical changes are seen involving the fifth toe. No evidence of acute osteomyelitis. he developed a large blister on the medial part of his right foot and this opened out and drain fluid. 06/01/2016 -- he has his MRI to be done this afternoon. 06/15/2016 -- MRI of the left forefoot without contrast shows 2 separate regions of cutaneous and subcutaneous edema and possible ulceration and blistering along the ball of the foot. No obvious osteomyelitis identified. MRI of the right foot showed cutaneous and subcutaneous thickening plantar to the first digit sesamoid with an ulcer crater but no  underlying osteomyelitis is identified. 06/22/2016 -- the right foot plantar ulcer has been draining a lot of  seropurulent material for the last few days. 06/30/2016 -- spoke to the dialysis center and I believe I spoke to [place] a PA at the center who discussed with me and agreed to putting [name] on vancomycin until his cultures arrive. On review of his culture report no WBCs were seen or no organisms were seen and the culture was reincubated for better growth. The final report is back and there were no predominant growth including Streptococcus or Staphylococcus. Clinically though he has a lot of drainage from both wounds a lot of undermining and there is further blebs on the left foot towards the interspace between his first and second toe. 08/10/2016 -- the culture from the right foot showed normal skin flora and there was no Staphylococcus aureus was group A streptococcus isolated. 09/21/2016 -- -- MRI of the right foot was done on 09/13/2016 - IMPRESSION: Findings most consistent with acute osteomyelitis throughout the great toe, sesamoid bones and plantar aspect of the head of the first metatarsal. Fluid in the sheath of the flexor tendon of the great toe could be sympathetic but is worrisome for septic tenosynovitis. First MTP joint effusion worrisome for septic joint. He was admitted to the hospital on 09/11/2016 and treated for a fever with vancomycin and cefepime. He was seen by Dr. Bobby Rumpf of infectious disease who recommended 6 weeks treatment with vancomycin and ceftazidime with his hemodialysis and to continue to see as in the wound clinic. Vascular consult was pending. The patient was discharged home on 09/14/2016 and he would continue with IV antibiotics for 6 weeks. 09/28/16 wound appears reasonably healthy. Continuing with total contact cast 10/05/16 wound is smaller and looking healthy. Continue with total contact cast. He continues on IV antibiotics 10/12/2016 -- he has developed a new wound on the dorsal aspect of his left big toe and this is a superficial injury with no  surrounding cellulitis. He has completed 30 days of IV antibiotics and is now ready to start his hyperbaric oxygen therapy as per his insurance company's recommendation. 10/19/2016 -- after the cast was removed on the right side he has got good resolution of his ulceration on the right plantar foot. he has a new wound on the left plantar foot in the region of his fourth metatarsal and this will need sharp debridement. 10/26/2016 -- Xray of the right foot complete: IMPRESSION: Changes consistent with osteomyelitis involving the head of the first metatarsal and base of the first proximal phalanx. The sesamoid bones are also likely involved given their positioning. 11/02/2016 -- there still awaiting insurance clearance for his hyperbaric oxygen therapy and hopefully he will begin treatment soon. 11/09/2016 -- started with hyperbaric oxygen therapy and had some barotrauma to the right ear and this was seen by ENT who was prescribed Afrin drops and would probably continue with HBO and he is scheduled for myringotomy tubes on Friday 11/16/2016 -- he had his myringotomy tubes placed on Friday and has been doing much better after that with some fluid draining out after hyperbaric treatment today. The pain was much better. 11/30/2016 -- over the last 2 days he noticed a swelling and change of color of his right second toe and this had been draining minimal fluid. 12/07/2016 -- x-ray of the right foot -- IMPRESSION:1. Progressive ulceration at the distal aspect of the second digit with significant soft tissue swelling and osseous changes in the distal  phalanx compatible with osteomyelitis. 2. Chronic osteomyelitis at the first MTP joint. 3. Ulcerations at the second and third toes as well without definite osseous changes. Osteomyelitis is not excluded. 4. Fifth digit amputation. On 12/01/2016 I spoke to Dr. Bobby Rumpf, the infectious disease specialist, who kindly agreed to treat this with IV  antibiotics and he would call in the order to the dialysis center and this has been discussed in detail with the patient who will make the appropriate arrangements. The patient will also book an appointment as soon as possible to see Dr. [name] in the office. Of note the patient has not been on antibiotics this entire week as the dialysis center did not receive any orders from Dr. [name]. I got in touch with Dr. [name] who tells me the patient has an appointment to see him this coming Wednesday. 12/14/2016 -- the patient is on ceftazidime and vancomycin during his dialysis, and I understand this was put on by his nephrologist Dr. Raliegh Ip possibly after speaking with infectious disease Dr. [name] 12/23/16; patient was on my schedule today for a wound evaluation as he had difficulties with his schedule earlier this week. I note that he is on vancomycin and ceftazidine at dialysis. In spite of this he arrives today with a new wound on the base of the right second toe this easily probes to bone. The known wound at the tip of the second toe With this area. He also has a superficial area on the medial aspect of the third toe on the side of the DIP. This does not appear to have much depth. The area on the plantar left foot is a deep area but did not probe the bone 12/28/16 -- he has brought in some lab work and the most recent labs done showed a hemoglobin of 12.1 hematocrit of 36.3, neutrophils of 51% WBC count of 5.6, BN of 53, albumin of 4.1 globulin of 3.7, vancomycin 13 g per mL. 01/04/2017 -- he saw Dr. [name] who has recommended a amputation of the right second toe and he is awaiting this date. He sees Dr. Bobby Rumpf of infectious disease tomorrow. The bone culture taken on 12/28/2016 had no growth in 2 days. 01/11/2017 -- was seen by Dr. Bobby Rumpf regarding the management and has recommended a eval by vascular surgeons. He recommended to continue the antibiotics during his  hemodialysis. Xray of the left foot -- IMPRESSION: No acute fracture, dislocation, or osseous erosion identified. 01/18/2017 -- he has his vascular workup later today and he is going to have his right foot second toe amputation this coming Friday by Dr. [name]. We have also put in for a another 30 treatments with hyperbaric oxygen therapy 01/25/2017 -- I have reviewed Dr. Donnetta Hutching is vascular report from last week where he reviewed him and thought his vascular function was good enough to heal his amputation site and no further tests were recommended. He also had his orthopedic related to surgery which is still pending the notes and we will review these next week. 02/01/2017 -- the operative remote of Dr. 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 02/08/2017 --he was seen by Dr. [name] on 02/03/2017 -- patient  is on ceftaz and Vanco. after a thorough review he had recommended to continue with antibiotics during hemodialysis. The patient was seen by Dr. [name], but I do not find any follow-up note on the electronic medical record. he has removed the dressing over the right foot to remove the sutures and asked him to see him back in a month's time 02/22/2017 -- patient has been febrile and has been having symptoms of the upper respiratory tract infection but has not been checked for the flu and it's been over 4 days now. He says he is feeling better today. Some drainage between his left first and second toe and this needed to be looked at 03/08/2017 -- was seen by Dr. Bobby Rumpf on 03/07/2017 after review he will stop his antibiotics and see  him back in a month to see if he is feeling well. 03/15/2017 -- he has completed his course of hyperbaric oxygen therapy and is doing fine with his health otherwise. 03/22/2017 -- his nutritionist at the dialysis center has recommended a protein supplement to help build his collagen and we will prescribe this for him when he has the details 05/03/2017 -- he recently noticed the area on the plantar aspect of his fifth metatarsal head which opened out and had minimal drainage. 05/10/2017 -- -- x-ray of the left foot -- IMPRESSION:1. No convincing conventional radiographic evidence of active osteomyelitis.2. Active soft tissue ulceration at the tip of the fifth digit, and at the lateral aspect of the foot adjacent the base of the fifth metatarsal. 3. Surgical changes of prior fourth toe amputation. 4. 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 5. Small vessel atherosclerotic vascular calcifications. 6. 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 ======= Readmission after 5 weeks: 06/15/2017 -- the patient returns after 5 weeks having had an MRI done on 05/17/2017 MRI of the left foot without contrast showed soft tissue ulcer overlying the base of the fifth metatarsal. 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 No drainable fluid collection to suggest an abscess. He was in hospital between 05/16/2017 and 05/25/2017 -- and on discharge was asked to follow-up with Dr. [name] and Dr. [name]. He was treated 2 weeks post discharge with vancomycin plus ceftriaxone with hemodialysis and oral metronidazole 500 mg 3 times a day. 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 last hemoglobin A1c was 6.7% He had a postoperative MR of the foot on 05/23/2017 -- which showed no new areas of cortical bone loss,  edema or soft tissue ulceration to suggest osteomyelitis. the patient has completed his course of IV antibiotics during dialysis and is to see the infectious disease doctor tomorrow. Dr. [name] had seen him after suture removal and asked him to keep the wound with a dry dressing 06/21/2017 -- he was seen by Dr. Bobby Rumpf of infectious disease on 06/16/2017 -- he stopped his Flagyl and will see him back in 6 weeks. He was asked to follow-up with Dr. [name] and with the wound clinic clinic. 07/05/17; bone biopsy from last time showed acute osteomyelitis. This is from the reminiscent in the left fifth metatarsal. Culture result is apparently still pending [holding for anaerobe}. From my understanding in this case this is been a progressive necrotic wound which is deteriorated markedly over the last 3 weeks since he returned here. He now has  a large area of exposed bone which was biopsied and cultured last week. Dr. Graylon Good has put him on vancomycin and Fortaz during his hemodialysis and Flagyl orally. He is to see Dr. [name] next week 07/19/2017 -- the patient was reviewed by Dr. [name] of orthopedics who reviewed the case in detail and agreed with the plan to continue with IV antibiotics, aggressive wound care and hyperbaric oxygen therapy. He would see him back in 3 weeks' time 08/09/2017 -- saw Dr. Bobby Rumpf on 08/03/2017 -he was restarted on his antibiotics for 6 weeks which included vanco, ceftaz and Flagyl. he recommended continuing his antibiotics for 6 weeks and reevaluate his completion date. He would continue with wound care and hyperbaric oxygen therapy. 08/16/2017 -- I understand he will be completing his 6 weeks of antibiotics sometime later this week. 09/19/2017 -- he has been without his wound VAC for the last week due to lack of supplies. He has been packing his wound with silver alginate. 10/04/2017 -- he has an appointment with Dr. [name] tomorrow and hence we will not  apply the wound VAC after his dressing changes today. 10/11/2017 -- he was seen by Dr. Bobby Rumpf on 10/05/2017, noted that the patient was currently off antibiotics, and after thorough review he recommended he follow-up with the wound care as per plan and no further antibiotics at this point. 11/29/17; patient is arrived for the wound on his left lateral foot.Marland Kitchen He is completed IV antibiotics and 2 rounds of hyperbaric oxygen for treatment of underlying osteomyelitis. He arrives today with a surface on most of the wound area however our intake nurse noted drainage from the superior aspect. This was brought to my attention. 12/13/2017 -- the right plantar foot had a large bleb and once the bleb was opened out a large callus and subcutaneous debris was removed and he has a plantar ulcer near the fourth metatarsal head 12/28/17 on evaluation today patient appears to be doing acutely worse in regard to his left foot. The wound which has been appearing to do better as now open up more deeply there is bone palpable at the base of the wound unfortunately. He tells me that this feels "like it did when he had osteomyelitis previously" he also noted that his second toe on the left foot appears to be doing worse and is swollen there does appear to be some fluid collected underneath. His right foot plantar ulcer appears to be doing somewhat better at this point and there really is no complication at the site currently. No fevers, chills, nausea, or vomiting noted at this time. Patient states that he normally has no pain at this site however. Today he is having significant pain. 01/03/18; culture done last week showed methicillin sensitive staph aureus and group B strep. He was on Septra however he arrives today with a fever of 101. He has a functional dialysis shunt in his left arm but has had no pain here. No cough. He still makes some urine no dysuria he did have abdominal pain nausea and diarrhea over  the weekend but is not had any diarrhea since yesterday. Otherwise he has no specific complaints 01/05/18; the patient returns today in follow-up for his presentation from 1/8. At that point he was febrile. I gave him some Levaquin adjusted for his dialysis status. He tells me the fever broke that night and it is not likely that this was actually a wound infection. He is gone on to have an MRI of the  left foot; this showed interval development of an abnormal signal from the base of the third and fourth metatarsal and in the cuboid reminiscent consistent with osteomyelitis. There is no mention of the left second toe I think which was a concern when it was ordered. The patient is taking his Levaquin 01/10/18; the patient has completed his antibiotics today. The area on the left lateral foot is smaller but it still probes easily to bone. He has underlying osteomyelitis here and sees Dr. Megan Salon of infectious disease this week The area on the right third plantar metatarsal head still requires debridement not much change in dimensions He has a new wound on the medial tip of his right third toe again this probes to bone. He only noticed this 2 days ago 01/17/18; the patient saw Dr. [name] last week and he is back on Vanco and Fortaz at dialysis. 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 We are previously seeing him for a wound also on the base of the fourth med head on the right. X- ray of this area did not show osteomyelitis in relation to a new wound on the tip of his right third toe. Again this probes to bone. Finally his second toe on the left which didn't really show anything on the MRI at least no report appears to have a separated cutaneous area which I think is going to come right off the tip of his toe and leave exposed bone. Whether this is infectious or ischemic I am not  clear Culture I did from the third toe last week which was his new wound was negative. Plain x-ray on the right foot did not show osteomyelitis in the toes 01/24/18; the patient is going went to see Dr. [name]. He is going to have a amputation of the left second and right third toe although paradoxically the left second toe looks better than last week. We have treating a deep probing wound on the left lateral foot and the area over the third metatarsal head on the right foot. 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 Also a debridement including bone of the left lateral foot and a closure which is still sutured. This is a bit surprising. Also apparent debridement of the base of the right third toe wound. 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 I'm not sure exactly where we are in the frame of things here. The wound on the left lateral foot is still sutured also the amputation sites of the left second and right third toe. He is been using Xeroform to the sutured areas silver alginate to the right foot 02/14/18; he continues on Rocephin at dialysis for osteomyelitis. I still don't have a good sense of where we are in the treatment duration. The wounds on the left lateral foot had the sutures removed and this clearly still probes deeply. We debrided the area with a #5 curet. We'll use silver alginate to the wound the area over the right third met head also required debridement of callus skin and subcutaneous tissue and necrotic debris over the wound surface. This tunnels superiorly but I did not unroofed  this today. The areas on the tip of his toes have a crusted surface eschar I did not debridement this either. 02/21/18; he continues on Rocephin at dialysis for  osteomyelitis. I don't have a good sense of where we are and the treatment duration. The wounds on the left lateral foot is callused over on presentation but requires debridement. The area on the right third med head plantar aspect actually is measuring smaller 02/28/18;patient is on Vanco and Fortaz at dialysis, I'm not sure why I thought this was Rocephin above unless it is been changed. I don't have a good sense of time frame here. Using silver collagen to the area on the left lateral foot and right third med head plantar foot 03/07/18; patient is completing bank and Fortaz at dialysis soon. He is using Silver collagen to the area on the left lateral foot and the right third metatarsal head. He has a smaller superficial wound distal to the left lateral foot wound. 03/14/18-he is here in follow-up evaluation for multiple ulcerations to his bilateral feet. He presents with a new ulceration to the plantar aspect of the left foot underneath the blister, this was deroofed to reveal a partial thickness ulcer. He is voicing no complaints or concerns, tolerated dialysis yesterday. We will continue with same treatment plan and he will follow-up next week 03/21/18; the patient has a area on the lateral left foot which still has a small probing area. The overall surface area of the wound is better. He presented with a new ulceration on the left foot plantar fifth metatarsal head last week. The area on the right third metatarsal head appears smaller.using silver alginate to all wound areas. The patient is changing the dressing himself. He is using a Darco forefoot offloading are on the right and a healing sandal on the left 03/28/18; original wound left lateral foot. Still nowhere close to looking like a heeling surface secondary wound on the left lateral foot at the base of his question fifth metatarsal which was new last week Right third metatarsal head. We have been using silver alginate all wounds 04/04/18;  the original wound on the left lateral foot again heavy callus surrounding thick nonviable subcutaneous tissue all requiring debridement. The new wound from 2 weeks ago just near this at the base of the fifth metatarsal head still looks about the same Unfortunately there is been deterioration in the third medical head wound on the right which now probes to bone. I must say this was a superficial wound at one point in time and I really don't have a good frame of reference year. I'll have to go back to look through records about what we know about the right foot. 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 He is also been to see Dr. [name]. Somewhere in here as somebody is ordered a VASCULAR evaluation which was done on 03/22/18. On the right is anterior tibial artery was monophasic triphasic at the posterior tibial artery. On the left anterior tibial artery monophasic posterior tibial artery monophasic. ABI in the right was 1.43 on the left 1.21. TBIs on the right at 0.41 and on the left at 0.32. A vascular consult was recommended and I think has been arranged. 04/11/18; Mr. Zuercher has not had an MRI of the right foot since 2017. Recent x-ray of the right foot done in January and February was negative. However he has had a  major deterioration in the wound over the third met head. He is completed antibiotics last week at dialysis [place]. I think he is going to see vascular in follow-up. The areas on the left lateral foot and left plantar fifth metatarsal head both look satisfactory. I debrided both of these areas although the tissue here looks good. The area on the left lateral foot once probe to bone it certainly does not do that now 04/18/18; MRI of the right foot is on Thursday. He has what looks to be serosanguineous purulent drainage coming out of the wound over the right third metatarsal head  today. He completed antibiotics bank and Fortaz 2 weeks ago at dialysis. He has a vascular follow-up with regards to his arterial insufficiency although I don't exactly see when that is booked. The areas on the left foot including the lateral left foot and the plantar left fifth metatarsal head look about the same. 04/25/18-He is here in follow-up evaluation for bilateral foot ulcers. MRI obtained was negative for osteomyelitis. Wound culture was negative. We will continue with same treatment plan he'll follow-up next week 05/02/18; the right plantar foot wound over the third metatarsal head actually looks better than when I last saw this. His MRI was negative for osteomyelitis wound culture was negative. On the left plantar foot both wounds on the plantar fifth metatarsal head and on the lateral foot both are covered in a very hard circumferential callus. 05/09/18; right plantar foot wound over the third metatarsal head stable from last week. Left plantar foot wound over the fifth metatarsal head also stable but with callus around both wound areas The area on the left lateral foot had thick callus over a surface and I had some thoughts about leaving this intact however it felt boggy. We've been using silver alginate all wounds 05/16/18; since the patient was last here he was hospitalized from 5/15 through 5/19. 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 This grew MRSA. He was placed on vancomycin.MRI of the left foot suggested "progressive" osteomyelitis and bone destruction of the cuboid and the base of the fifth metatarsal. Stable erosive changes of the base of the second metatarsal. I'm wondering if they're aware that he had surgery and debridement in the area of the underlying bone previously by Dr. [name]. In any case, He was also revascularized with an angioplasty and stenting of the  left tibial peroneal trunk and angioplasty of the left posterior tibial artery. He is on vancomycin at dialysis. He has a 2 week follow-up with infectious disease. He has vascular surgery follow-up. He was started on Plavix. 05/23/18; the patient's wound on the lateral left foot at the level of the fifth metatarsal head and the plantar wound on the plantar fifth metatarsal head both look better. The area on the right third metatarsal head still has depth and undermining. We've been using silver alginate. He is on vancomycin. He has been revascularized on the left 05/30/18; he continues on vancomycin at dialysis. Revascularized on the left. The area on the left lateral foot is just about closed. Unfortunately the area over the plantar fifth metatarsal head undermining superiorly as does the area over the right third metatarsal head. Both of these significantly deteriorated from last week. He has appointments next week with infectious disease and orthopedics I delayed putting on a cast on the left until those appointments which  therefore we made we'll bring him in on Friday the 14th with the idea of a cast on the left foot 06/06/18; he continues on vancomycin at dialysis. Revascularized on the left. He arrives today with a ballotable swelling just above the area on the left lateral foot where his previous wound was. He has no pain but he is reasonably insensate. The difficult areas on the plantar left fifth metatarsal head looks stable whereas the area on the right third plantar metatarsal head about the same as last week there is undermining here although I did not unroofed this today. He required a considerable debridement of the swelling on the left lateral foot area and I unroofed an abscess with a copious amount of brown purulent material which I obtained for culture. I don't believe during his recent hospitalization he had any further imaging although I need to review this. Previous cultures  from this area done in this clinic showed MRSA, I would be surprised if this is not what this is currently even though he is on vancomycin. 06/13/18; he continues on vancomycin at dialysis however he finishes this on Sunday and then graduates the doxycycline previously prescribed by Dr. [name]. The abscess site on the lateral foot that I unroofed last time grew moderate amounts of methicillin-resistant staph aureus that is both vancomycin and tetracycline sensitive so we should be okay from that regard. He arrives today in clinic with a connection between the abscess site and the area on the lateral foot. We've been using silver alginate to all his wound areas. The right third metatarsal head wound is measuring smaller. Using silver alginate on both wound areas 06/20/18; transitioning to doxycycline prescribed by Dr. [name]. The abscess site on the lateral foot that I unroofed 2 weeks ago grew MRSA. That area has largely closed down although the lateral part of his wound on the fifth metatarsal head still probes to bone. I suspect all of this was connected. On the right third metatarsal head smaller looking wound but surrounded by nonviable tissue that once again requires debridement using silver alginate on both areas 06/27/18; on doxycycline 100 twice a day. The abscess on the lateral foot has closed down. Although he still has the wound on the left fifth metatarsal head that extends towards the lateral part of the foot. The most lateral part of this wound probes to bone Right third metatarsal head still a deep probing wound with undermining. Nevertheless I elected not to debridement this this week If everything is copious static next week on the left I'm going to attempt a total contact cast 07/04/18; he is tolerating his doxycycline. The abscess on the left lateral foot is closed down although there is still a deep wound here that probes to bone. We will use silver collagen under the total  contact cast Silver collagen to the deep wound over the right third metatarsal head is well 07/11/18; he is tolerating doxycycline. The left lateral fifth plantar metatarsal head still probes deeply but I could not probe any bone. We've been using silver collagen and this will be the second week under the total contact cast Silver collagen to deep wound over the right third metatarsal head as well 07/18/18; he is still taking doxycycline. The left lateral fifth plantar metatarsal this week probes deeply with a large amount of exposed bone. Quite a deterioration. He required extensive debridement. Specimens of bone for pathology and CNS obtained Also considerable debridement on the right third metatarsal head 07/25/18; bone for  pathology last week from the lateral left foot showed osteomyelitis. CandS of the bone Enterobacter. And Klebsiella. He is now on ciprofloxacin and the doxycycline is stopped [Dr. [name] of infectious disease] area He has an appointment with Dr. [name] tomorrow I'm going to leave the total contact cast off for this week. May wish to try to reapply that next week or the week after depending on the wound bed looks. I'm not sure if there is an operative option here, previously is followed with Dr. [name] 08/01/18 on evaluation today patient presents for reevaluation. He has been seen by infectious disease, Dr. [name], and he has placed the patient back on Ceftaz currently to be given to him at dialysis three times a week. 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 Fortunately there does not appear to be the evidence of infection at this point in time. Overall the patient has been tolerating the dressing changes without complication. We are no longer due lives in the cast of left foot secondary to the infection obviously. 08/10/18 on evaluation today patient appears to be doing rather well all things considered  in regard to his left plantar foot ulcer. He does have a significant callous on the lateral portion of his left foot he wonders if I can help clean that away to some degree today. Fortunately he is having no evidence of infection at this time which is good news. No fevers chills noted. With that being said his right plantar foot actually does have a significant callous buildup around the wound opening this seems to not be doing as well as I would like at this point. I do believe that he would benefit from sharp debridement at this site. Subsequently I think a total contact cast would be helpful for the right foot as well. 08/17/18; the patient arrived today with a total contact cast on the right foot. This actually looks quite good. He had gone to see Dr. Megan Salon of infectious disease about the osteomyelitis on the left foot. I think he is on IV Fortaz at dialysis although I am not exactly sure of the rationale for the Fortaz at the time of this dictation. He also arrived in today with a swelling on the lateral left foot this is the site of his original wounds in fact when I first saw this man when he was under Dr. Ardeen Garland care I think this was the site of where the wound was located. It was not particularly tender however using a small scalpel I opened it to [place] some moderate amount of purulent drainage. 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 Last MRI was in May that showed progressive osteomyelitis and bone destruction of the cuboid and base of the fifth metatarsal. Last week he had an abscess over this same area this was removed. Culture was negative. We are continuing with a total contact cast to the area on the third metatarsal head on the right and making some good progress here. The patient asked me again about renal transplant. He is not on the list because  of the open wounds. 08/31/18; apparently the patient had an interruption in the [place] but he is now back on this at dialysis. Apparently there was a misunderstanding and Dr. Crissie Figures orders. Due to have the MRI of the  left foot tonight. The area on the right foot continues to have callus thick skin and subcutaneous tissue around the wound edge that requires constant debridement however the wound is smaller we are using a total contact cast in this area. Alginate all wounds 09/07/18; without much surprise the MRI of his left foot showed osteomyelitis in the reminiscent of his fourth metatarsal but also the third metatarsal. She arrives in clinic today with the right foot and a total contact cast. There was purulent drainage coming out of the wound which I have cultured. Marked deterioration here with undermining widely around the wound orifice. Using pickups and a scalpel I remove callus and subcutaneous tissue from a substantial new opening. In a similar fashion the area on the left lateral foot that was blistered last week I have opened this and remove skin and subcutaneous tissue from this area to expose a obvious new wound. I think there is extension and communication between all of this on the left foot. The patient is on Fortaz at dialysis. Culture done of the right foot. We are clearly not making progress here. We have made him an appointment with Dr. [name] of orthopedics. I think an amputation of the left leg may be discussed. I don't think there is anything that can be done with foot salvage. 09/14/18; culture from the right foot last time showed a very resistant MRSA. This is resistant to doxycycline. I'm going to try to get linezolid at least for a week. He does not have a appointment with Dr. [name] yet. 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 I'll send a message to Dr. [name] of infectious disease. I want the patient to see  Dr. [name] to go over the pros and cons of an amputation which I think will be a BKA. The patient had a question about whether this is curative or not. I told him that I thought it would be although spread of staph aureus infection is not unheard of. MRI of the right foot was done at the end of April 2019 did not show osteomyelitis. The patient last saw Dr. [name] on 9/3. I'll send Dr. [name] a message. I would really like him to weigh the pros and cons of a BKA on the left otherwise he'll probably need IV antibiotics and perhaps hyperbaric oxygen again. He has not had a good response to this in the past. His MRI earlier this month showed progressive damage in the remnants of the fourth and third metatarsal 09/21/2018; sees Dr. [name] of orthopedics next week to discuss a left BKA in response to the osteomyelitis in the left foot. Using silver alginate to all wound areas. He is completing the Zyvox I did put in for him after last culture showed MRSA 09/29/2018; patient saw Dr. [name] of orthopedics discussed the osteomyelitis in the left foot third and fourth metatarsals. He did not recommend urgent surgery but certainly stated the only surgical option would be a BKA. He is communicating with Dr. [name]. The problem here is the instability of the areas on his foot which constantly generate draining abscesses. I will communicate with Dr. [name] about this. He has an appointment on 10/23 10/05/2018; patient sees Dr. [name] on 10/23. I have sent him a secure message not ordered any additional antibiotics for now. Patient continues to have 2 open areas on the left plantar foot at the fifth metatarsal head and the lateral aspect of  the foot. These have not changed all that much. The area on the right third met head still has thick callus and surrounding subcutaneous tissue we have been using silver alginate 10/12/2018; patient sees Dr. [name] or colleague on 10/23. 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 The area on the right plantar foot is just about closed. The superior wound on the left fifth metatarsal head is callused over but I am not sure if this is closed. The area below it is about the same. Considerable amount of callus on the lateral foot. We have been using silver alginate to the wounds 10/19/2018; the patient saw Dr. [name] on 10/22. He wishes to try and continue to save the left foot. He has been given 6 weeks of oral ciprofloxacin. We continue to put a total contact cast on the right foot. The area over the fifth metatarsal head on the left is callused over/perhaps healed but I did not remove the callus to find out. He still has the wound on the left lateral midfoot requiring debridement. We have been using silver alginate to all wound areas 10/26/2018 On the left foot our intake nurse noted some purulent drainage from the inferior wound [currently the only one that is not callused over] On the right foot even though he is in total contact cast a considerable amount of thick black callus and surface eschar. On this side we have been using silver alginate under a total contact cast. he remains on ciprofloxacin as prescribed by Dr. [name] 11/02/2018; culture from last week which is done of the probing area on the left midfoot wound showed MRSA "few". I am going to need to contact Dr. [name] which I will do today I think he is going to need IV antibiotics again. The area on the left foot which was callused on the side is clearly separating today all of this was removed a copious amounts of callus and necrotic subcutaneous tissue. The area on the right plantar foot actually remained healthy looking with a healthy granulated base. We are using silver alginate to all wound areas 11/09/2018; unfortunately neither 1 of the patient's wounds areas looks at all satisfactory. On the left he has considerable  necrotic debris from the plantar wound laterally over the foot. I removed copious amounts of material including callus skin and subcutaneous tissue. On the right foot he arrives with undermining and frankly purulent drainage. Specimen obtained for culture and debridement of the callused skin and subcutaneous tissue from around the circumference. Because of this I cannot put him back in a total contact cast but to be truthful we are really unfortunately not making a lot of progress I did put in a secure text message to Dr. [name] wondering about the MRSA on the left foot. He suggested vancomycin although this is not started. I was left wondering if he expected me to send this into dialysis. Has we have more purulence on the right side wait for that result before calling dialysis 11/16/2018; I called dialysis earlier this week to get the vancomycin started. I think he got the first dose on Tuesday. The patient tells me that he fell earlier this week twisting his left foot and ankle and he is swelling. He continues to not look well. He had blood cultures done at dialysis on Tuesday he is not been informed of the results. 12/07/2018; the patient was admitted to hospital from 11/22/2018 through 12/02/2018.  He underwent a left BKA. Apparently had staph sepsis. In the meantime being off the right foot this is closed over. He follows up with Dr. 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 Subsequent to this he actually had multiple falls in fact he tells me to following which calls the wound to be his apparently. He has been seeing Dr. [name] and his physician assistant in the interim. They actually did want him to come back for reevaluation to see if there's anything we can do for me wound care perspective the help this area to heal more appropriately. The patient states that he does not have  a tremendous amount of pain which is good news. No fevers, chills, nausea, or vomiting noted at this time. We have gotten approval from Dr. [name] office had [place] for [place] to treat the patient for his stop wound. This is due to the fact that the patient was in the 90 day postop global. 1/21; the patient does not have an open wound on the right foot although he does have some pressure areas that will need to be padded when he is transferring. The dehisced surgical wound looks clean although there is some undermining of 1.5 cm superiorly. 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 This appears healthy but there is undermining. TheraSkin #1 2/11; TheraSkin #2 2/25; TheraSkin #3. Wound is measuring smaller 3/10; TheraSkin #4 wound is measuring smaller 3/24; TheraSkin #5 wound is measuring smaller and looks healthy. 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 He has no open area on the right foot although he does have some callused area over the original third metatarsal head wound. The third metatarsal is also subluxed on the right foot. I have warned him today that he cannot consider wearing a prosthesis for at least a month but he can go for measurements. He is going to need to keep the right foot padded is much as possible in his diabetic shoe indefinitely READMISSION 06/12/2019 Mr. Zahniser is a type II diabetic on dialysis. He has been in this clinic multiple times with wounds on his bilateral lower extremities. 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 He also has a history of wounds on his right foot but tells me that everything is going well here. He actually came in here with his prosthesis walking with a cane. We were all really quite gratified to see this He states over the  last several weeks he has had a painful area at the tip of the left third finger. His dialysis shunt in his is in the left upper arm and this particularly hurts during dialysis when his fingers get numb and there is a lot of pain in the tip of his left third finger. I believe he is seen vascular surgery. He had a Doppler done to evaluate for a dialysis steal syndrome. He is going for a banding procedure 1 week from today towards the left AV fistula. I think if that is unsuccessful they will be looking at creating a new shunt. He had a course of doxycycline when he which he finished about a week ago. He has been using Bactroban to the  finger 6/25; the patient had his banding procedure and things seem to be going better. He is not having pain in his hand at dialysis. The area on the tip of his finger seems about closed. He did however have a paronychia I the last time I saw him. I have been advising him to use topical Bactroban washing the finger. Culture I did last time showed a few staph epidermidis which I was willing to dismiss as superficial skin contaminant. He arrives today with the finger wound looking better however the paronychia a seems worse 7/9; 2-week follow-up. He does not have an open wound on the tip of his third left finger. I thought he had an initial ischemic wound on the tip of the finger as well as a paronychia a medially. All of this appears to be closed. 8/6-Patient returns after 3 weeks for follow-up and has a right second met head plantar callus with a significant area of maceration hiding an ulcer ABI repeated today on right - 1.26 8/13-Patient returns at 1 week, the right second met head plantar wound appears to be slightly better, it is surrounded by the callus area we are using silver alginate 8/20; the patient came back to clinic 2 weeks ago with a wound on the right second metatarsal head. He apparently told me he developed callus in this area but also went away from  his custom shoes to a pair of running shoes. Using silver alginate. 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 There might be much we can do to offload this although the patient tells me he has a wheelchair that he is using to try and stay off the wound is much as possible 8/27; right second metatarsal head. Still small punched out area with thick callus and subcutaneous tissue around the wound. We have been using silver collagen. This is always been an issue with this man's wound especially on this foot 9/3; right second met head. Still the same punched-out area with thick callus and subcutaneous tissue around the wound removing the circumference demonstrates repetitively undermining area. I have removed all the subcutaneous tissue associated with this. We have been using silver collagen 9/15; right second metatarsal head. Same punched-out thick callus and subcutaneous tissue around the wound area. Still requiring debridement we have been using silver collagen I changed back to silver alginate X-ray last time showed no evidence of osteomyelitis. Culture showed Klebsiella and he was started and Keflex apparently just 4 days ago 9/24; right second metatarsal head. He is completed the antibiotics I gave him for 10 days. Same punched-out thick callus and subcutaneous tissue around the area. Still requiring debridement. We have been using silver alginate. The area itself looks swollen and this could be just [place] that comes on this area with walking on a prosthesis on the left however I think he needs an MRI and I am going to order that today. There is not an option to offload this further 10/1; right second plantar metatarsal head. MRI booked for October 6. Generally looking better today using silver alginate 10/8; right second plantar metatarsal head. MRI that was done on 10/6 was negative for osteomyelitis noted prior amputations of the second and fourth toes at the [facility].  Prior amputation of the third toe to the base of the middle phalanx. Prior amputation of the fifth toe to the head of the metatarsal. They also noted a partially non-united stress fracture at the base of the third  metatarsal. He does not recall about hearing about this. He did not have any pain but then again he has reduced sensation from diabetic neuropathy 10/15; right second plantar metatarsal head. Still in the same condition callus nonviable subcutaneous tissue which cleans up nicely with debridement but reforms by the next week. The patient tells me that he is offloading this is much as he can including taking his wheelchair to dialysis. We have been using silver alginate, changed to silver collagen today 10/22; right second plantar metatarsal head. Wound measures smaller but still thick callus around this wound. Still requiring debridement 11/5 right second plantar metatarsal head. Less callus around the wound circumference but still requiring debridement. There is still some undermining which I cleaned out the wound looks healthy but still considerable punched out depth with a relatively small circumference to the wound there is no palpable bone no purulent drainage 11/12; right second plantar metatarsal head. Small wound with thick callus tissue around the wound and significant undermining. We have been using silver collagen after my continuous debridements of this area 11/19; patient arrives in today with purulent drainage coming out of the wound in the second third met head area on the right foot. Serosanguineous thick drainage. Specimen obtained for culture. 11/21/2019 on evaluation today patient appears to be doing well with regard to his foot ulcer. The good news is is culture came back negative for any bacteria there was no growth. The other good news is his x-ray also appears to be doing well. The foot is also measuring much better than what it was. Overall I am very pleased with  how things seem to be progressing. 12/22; patient was in [facility] for several weeks due to the death of a family member. He said he stayed off the wound using silver alginate. 01/03/2020. Patient has a small open area with roughly 0.3 cm of circumferential undermining. The orifice looks smaller. We have been using silver collagen. There is not a wound more aggressive way to offload this area as he has a prosthesis on the other leg 1/21; again the same small area is 2 weeks ago. We have been using silver collagen I change that to endoform today I really believe that this is an offloading issue 2/4; the area on the right third met head. We have been using endoform. 2/11 the area on the right third met head we have been using silver alginate. 2/25; the area on the right third met head. There is much less callus on this today. The patient states he is offloading this even more aggressively. As an example he is taking his wheelchair into dialysis on most days Electronic Signature(s) Signed: 02/21/2020 5:41:09 PM By: Linton Ham MD Entered By: Linton Ham on 02/21/2020 14:45:08 -------------------------------------------------------------------------------- Physical Exam Details Patient Name: Date of Service: [name], [name] 02/21/2020 1:45 PM Medical Record [id number] Patient Account Number: [id number] Date of Birth/Sex: Treating RN: 24-Mar-1951 (69 y.o. [name] Primary Care Provider: [name] Other Clinician: Referring Provider: Treating Provider/Extender:Skyllar Notarianni, Jeronimo Greaves, Buena Irish in Treatment: 36 Constitutional Sitting or standing Blood Pressure is within target range for patient.. Pulse regular and within target range for patient.Marland Kitchen Respirations regular, non-labored and within target range.. Temperature is normal and within the target range for the patient.Marland Kitchen Appears in no distress. Notes Wound exam; proved looking wound surface less callus.  Still debrided with a #3 curette callus subcutaneous debris. This is a small wound with 0.4 cm roughly in depth. There is no palpable  bone and no evidence of infection Electronic Signature(s) Signed: 02/21/2020 5:41:09 PM By: Linton Ham MD Entered By: Linton Ham on 02/21/2020 14:47:52 -------------------------------------------------------------------------------- Physician Orders Details Patient Name: Date of Service: [name], [name] 02/21/2020 1:45 PM Medical Record [id number] Patient Account Number: [id number] Date of Birth/Sex: Treating RN: May 01, 1951 (69 y.o. 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 - Thursday Dressing Change Frequency Wound #41 Right Metatarsal head second Change Dressing every other day. Skin Barriers/Peri-Wound Care Barrier cream - as needed for maceration, wetness, or redness. Wound Cleansing May shower and wash wound with soap and water. Primary Wound Dressing Wound #41 Right Metatarsal head second Calcium Alginate with Silver - pack into wound bed. Secondary Dressing Wound #41 Right Metatarsal head second Foam - foam donut Kerlix/Rolled Gauze Dry Gauze Other: - felt to periwound and place one below wound. lightly wrap coban to hold dressing in place. felt / pad wound Edema Control Avoid standing for long periods of time Elevate legs to the level of the heart or above for 30 minutes daily and/or when sitting, a frequency of:  - throughout the day. Off-Loading Other: - felt to periwound and place one below wound. Additional Orders / Instructions Other: - minimize walking and standing on foot to aid in wound healing and callus buildup. Electronic Signature(s) Signed: 02/21/2020 5:41:09 PM By: Linton Ham MD Signed: 02/21/2020 6:07:18 PM By: Deon Pilling Entered By: Deon Pilling on 02/21/2020 14:29:55 -------------------------------------------------------------------------------- Problem List Details Patient Name: Date of Service: [name], [name] 02/21/2020 1:45 PM Medical Record [id number] Patient Account Number: [id number] Date of Birth/Sex: Treating RN: [birth year] (69 y.o. 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, 06/12/2019 No Yes other extremity L03.115 Cellulitis of right lower limb 11/15/2019 No Yes Inactive Problems ICD-10 Code Description Active Date Inactive Date L03.114 Cellulitis of left upper limb 06/12/2019 06/12/2019 L98.498 Non-pressure chronic ulcer of skin of other sites with other 06/12/2019 06/12/2019 specified severity S61.209D Unspecified open wound of unspecified finger without damage 06/12/2019 06/12/2019 to nail, subsequent encounter Resolved Problems Electronic Signature(s) Signed: 02/21/2020 5:41:09 PM By: Linton Ham MD Entered By: Linton Ham on 02/21/2020 14:44:11 -------------------------------------------------------------------------------- Progress Note Details Patient Name: Date of Service: [name] 02/21/2020 1:45 PM Medical Record [id number] Patient Account Number: [id number] Date of Birth/Sex: Treating RN: April 03, 1951 (69 y.o. [name] Primary Care Provider: [name] Other Clinician: Referring Provider: Treating Provider/Extender:Cella Cappello, Jeronimo Greaves, Buena Irish in Treatment: 36 Subjective History of Present Illness (HPI) The following HPI elements were documented for the patient's wound: Location: Patient presents with a wound to bilateral feet. Quality: Patient reports experiencing essentially no pain. Severity: Mildly severe wound with no evidence of infection Duration: Patient has had the wound for greater than 2 weeks prior to presenting  for treatment The patient is a pleasant 69 yrs old bm here for evaluation of ulcers on the plantar aspect of both feet. 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 He has a left arm graft for access. He has been trying to stay off his feet for weeks but does not seem to have any improvement in the wound. He has been seeing someone at the foot center and was referred to the Wound Care center for further evaluation. 12/30/15 the patient has 3 wounds one over the right first metatarsal head and 2 on the left foot at the left fifth and lleft first metatarsal head. All of these look relatively similar. The one over the left fifth probe to bone I could not prove that any of the others did. I note his MRI in November that did not show osteomyelitis. His peripheral pulses seem robust. All of these underwent surgical debridement to remove callus nonviable skin and subcutaneous tissue 01/06/16; the patient had his sutures removed from the right fifth ray amputation. There may be a small open part of this superiorly but otherwise the incision looks good. Areas over his right first, left first and left fifth metatarsal head all underwent surgical debridement as a varying degree of  callus, skin and nonviable subcutaneous tissue. The area that is most worrisome is the right fifth metatarsal head which has a wound probes precariously close to bone. There is no purulent drainage or erythema 01/13/16; I'm not exactly sure of the status of the right fifth ray amputation site however he follows with Dr. [name] later this week. The area over his right first plantar metatarsal head, left first and fifth plantar metatarsal head are all in the same status. Thick circumferential callus, nonviable subcutaneous tissue. Culture of the left fifth did not culture last week 01/20/16; all of the patient's wounds appear and roughly the same state although his amputation site on the right lateral foot looks better. His wounds over the right first, left first and left fifth metatarsal heads all underwent difficult surgical debridement removing circumferential callus nonviable skin and subcutaneous tissue. There is no overt evidence of infection in these areas. MRI at the end of December of the left foot did not show osteomyelitis, right foot showed osteomyelitis of the right fifth digit he is now status post amputation. 01/27/16 the patient's wounds over his plantar first and fifth metatarsal heads on the left all appear better having been started on a total contact cast last week. They were debridement of circumferential callus and nonviable subcutaneous tissue as was the wound over the first metatarsal head. His surgical incision on the right also had some light surface debridement done. 03/23/2016 -- the patient was doing really well and most of his wounds had almost completely healed but now he came back today with a history of having a discharge from the area of his right foot on the plantar aspect and also between his first and second toe. He also has had some discharge from the left foot. Addendum: I spoke to the PA [name] at the dialysis center whose fax number is [phone].  We discussed the infection the patient has and she will put the patient on vancomycin and Fortaz until the final culture report is back. We will fax this as soon as available. 03/30/2016 -- left foot x-ray IMPRESSION:No definitive osteomyelitis noted. X-ray of the right foot -- IMPRESSION: 1. Soft tissue swelling. Prior amputation right  fifth digit. No acute or focal bony abnormality identified. If osteomyelitis remains a clinical concern, MRI can be obtained. 2. Peripheral vascular disease. His culture reports have grown an MSSA -- and we will fax this report to his hemodialysis center. He was called in a prescription of oral doxycycline but I have told her not to fill this in as he is already on IV antibiotics. 04/06/2016 -- a few days ago, I spoke to the hemodialysis center nurse who had stopped the IV antibiotics and he was given a prescription for doxycycline 100 mg by mouth twice a day for a week and he is on this at the present time. 05/11/2016 -- he has recently seen his PCP this week and his hemoglobin A1c was 7. He is working on his paperwork to get his orthotic shoes. 05/25/2016 -- -- x-ray of the left foot IMPRESSION:No acute bony abnormality. No radiographic changes of acute osteomyelitis. No change since prior study. X-ray of the right foot -- IMPRESSION: Postsurgical changes are seen involving the fifth toe. No evidence of acute osteomyelitis. he developed a large blister on the medial part of his right foot and this opened out and drain fluid. 06/01/2016 -- he has his MRI to be done this afternoon. 06/15/2016 -- MRI of the left forefoot without contrast shows 2 separate regions of cutaneous and subcutaneous edema and possible ulceration and blistering along the ball of the foot. No obvious osteomyelitis identified. MRI of the right foot showed cutaneous and subcutaneous thickening plantar to the first digit sesamoid with an ulcer crater but no underlying osteomyelitis is  identified. 06/22/2016 -- the right foot plantar ulcer has been draining a lot of seropurulent material for the last few days. 06/30/2016 -- spoke to the dialysis center and I believe I spoke to [place] a PA at the center who discussed with me and agreed to putting [name] on vancomycin until his cultures arrive. On review of his culture report no WBCs were seen or no organisms were seen and the culture was reincubated for better growth. The final report is back and there were no predominant growth including Streptococcus or Staphylococcus. Clinically though he has a lot of drainage from both wounds a lot of undermining and there is further blebs on the left foot towards the interspace between his first and second toe. 08/10/2016 -- the culture from the right foot showed normal skin flora and there was no Staphylococcus aureus was group A streptococcus isolated. 09/21/2016 -- -- MRI of the right foot was done on 09/13/2016 - IMPRESSION: Findings most consistent with acute osteomyelitis throughout the great toe, sesamoid bones and plantar aspect of the head of the first metatarsal. Fluid in the sheath of the flexor tendon of the great toe could be sympathetic but is worrisome for septic tenosynovitis. First MTP joint effusion worrisome for septic joint. He was admitted to the hospital on 09/11/2016 and treated for a fever with vancomycin and cefepime. He was seen by Dr. Bobby Rumpf of infectious disease who recommended 6 weeks treatment with vancomycin and ceftazidime with his hemodialysis and to continue to see as in the wound clinic. Vascular consult was pending. The patient was discharged home on 09/14/2016 and he would continue with IV antibiotics for 6 weeks. 09/28/16 wound appears reasonably healthy. Continuing with total contact cast 10/05/16 wound is smaller and looking healthy. Continue with total contact cast. He continues on IV antibiotics 10/12/2016 -- he has developed a new wound  on the dorsal aspect of his left  big toe and this is a superficial injury with no surrounding cellulitis. He has completed 30 days of IV antibiotics and is now ready to start his hyperbaric oxygen therapy as per his insurance company's recommendation. 10/19/2016 -- after the cast was removed on the right side he has got good resolution of his ulceration on the right plantar foot. he has a new wound on the left plantar foot in the region of his fourth metatarsal and this will need sharp debridement. 10/26/2016 -- Xray of the right foot complete: IMPRESSION: Changes consistent with osteomyelitis involving the head of the first metatarsal and base of the first proximal phalanx. The sesamoid bones are also likely involved given their positioning. 11/02/2016 -- there still awaiting insurance clearance for his hyperbaric oxygen therapy and hopefully he will begin treatment soon. 11/09/2016 -- started with hyperbaric oxygen therapy and had some barotrauma to the right ear and this was seen by ENT who was prescribed Afrin drops and would probably continue with HBO and he is scheduled for myringotomy tubes on Friday 11/16/2016 -- he had his myringotomy tubes placed on Friday and has been doing much better after that with some fluid draining out after hyperbaric treatment today. The pain was much better. 11/30/2016 -- over the last 2 days he noticed a swelling and change of color of his right second toe and this had been draining minimal fluid. 12/07/2016 -- x-ray of the right foot -- IMPRESSION:1. Progressive ulceration at the distal aspect of the second digit with significant soft tissue swelling and osseous changes in the distal phalanx compatible with osteomyelitis. 2. Chronic osteomyelitis at the first MTP joint. 3. Ulcerations at the second and third toes as well without definite osseous changes. Osteomyelitis is not excluded. 4. Fifth digit amputation. On 12/01/2016 oo I spoke to Dr. Bobby Rumpf, the infectious disease specialist, who kindly agreed to treat this with IV antibiotics and he would call in the order to the dialysis center and this has been discussed in detail with the patient who will make the appropriate arrangements. The patient will also book an appointment as soon as possible to see Dr. [name] in the office. Of note the patient has not been on antibiotics this entire week as the dialysis center did not receive any orders from Dr. [name]. I got in touch with Dr. [name] who tells me the patient has an appointment to see him this coming Wednesday. 12/14/2016 -- the patient is on ceftazidime and vancomycin during his dialysis, and I understand this was put on by his nephrologist Dr. Raliegh Ip possibly after speaking with infectious disease Dr. [name] 12/23/16; patient was on my schedule today for a wound evaluation as he had difficulties with his schedule earlier this week. I note that he is on vancomycin and ceftazidine at dialysis. In spite of this he arrives today with a new wound on the base of the right second toe this easily probes to bone. The known wound at the tip of the second toe With this area. He also has a superficial area on the medial aspect of the third toe on the side of the DIP. This does not appear to have much depth. The area on the plantar left foot is a deep area but did not probe the bone 12/28/16 -- he has brought in some lab work and the most recent labs done showed a hemoglobin of 12.1 hematocrit of 36.3, neutrophils of 51% WBC count of 5.6, BN of 53, albumin of 4.1 globulin of  3.7, vancomycin 13 g per mL. 01/04/2017 -- he saw Dr. [name] who has recommended a amputation of the right second toe and he is awaiting this date. He sees Dr. Bobby Rumpf of infectious disease tomorrow. The bone culture taken on 12/28/2016 had no growth in 2 days. 01/11/2017 -- was seen by Dr. Bobby Rumpf regarding the management and has recommended a eval by  vascular surgeons. He recommended to continue the antibiotics during his hemodialysis. Xray of the left foot -- IMPRESSION: No acute fracture, dislocation, or osseous erosion identified. 01/18/2017 -- he has his vascular workup later today and he is going to have his right foot second toe amputation this coming Friday by Dr. [name]. We have also put in for a another 30 treatments with hyperbaric oxygen therapy 01/25/2017 -- I have reviewed Dr. Donnetta Hutching is vascular report from last week where he reviewed him and thought his vascular function was good enough to heal his amputation site and no further tests were recommended. He also had his orthopedic related to surgery which is still pending the notes and we will review these next week. 02/01/2017 -- the operative remote of Dr. 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 02/08/2017 --he was seen by Dr. [name] on 02/03/2017 -- patient is on Owen. after a thorough review he had recommended to continue with antibiotics during hemodialysis. The patient was seen by Dr. [name], but I do not find any follow-up note on the electronic medical record. he has removed the dressing over the right foot to remove the sutures and asked him to see him back in a month's time 02/22/2017 -- patient has been febrile and has been having symptoms of the upper respiratory tract infection but has not been checked for the flu and it's been over 4 days now. He says he is feeling better today. Some drainage between his left first and second toe and this needed to be looked at 03/08/2017 -- was seen by Dr. Bobby Rumpf on 03/07/2017 after review he will stop his antibiotics and see him back in a month to see if he is feeling well. 03/15/2017 -- he has completed his course of hyperbaric oxygen therapy and is doing fine with his health otherwise. 03/22/2017 -- his nutritionist at the dialysis center has recommended a protein supplement to help build his collagen and we will prescribe this for him when he has the details 05/03/2017 -- he recently noticed the area on the plantar aspect of his fifth metatarsal head which opened out and had minimal drainage. 05/10/2017 -- -- x-ray of the left foot -- IMPRESSION:1. No convincing conventional radiographic evidence of active osteomyelitis.2. Active soft tissue ulceration at the tip of the fifth digit, and at the lateral aspect of the foot adjacent the base of the fifth metatarsal. 3. Surgical changes of prior fourth toe amputation. 4. 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 5. Small vessel atherosclerotic vascular calcifications. 6. 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 ======= Readmission after 5 weeks: 06/15/2017 -- the patient returns after 5 weeks having had an MRI done on 05/17/2017 MRI of the  left foot without contrast showed soft tissue ulcer overlying the base of the fifth metatarsal. 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 No drainable fluid collection to suggest an abscess. He was in hospital between 05/16/2017 and 05/25/2017 -- and on discharge was asked to follow-up with Dr. [name] and Dr. [name]. He was treated 2 weeks post discharge with vancomycin plus ceftriaxone with hemodialysis and oral metronidazole 500 mg 3 times a day. 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 last hemoglobin A1c was 6.7% He had a postoperative MR of the  foot on 05/23/2017 -- which showed no new areas of cortical bone loss, edema or soft tissue ulceration to suggest osteomyelitis. the patient has completed his course of IV antibiotics during dialysis and is to see the infectious disease doctor tomorrow. Dr. [name] had seen him after suture removal and asked him to keep the wound with a dry dressing 06/21/2017 -- he was seen by Dr. Bobby Rumpf of infectious disease on 06/16/2017 -- he stopped his Flagyl and will see him back in 6 weeks. He was asked to follow-up with Dr. [name] and with the wound clinic clinic. 07/05/17; bone biopsy from last time showed acute osteomyelitis. This is from the reminiscent in the left fifth metatarsal. Culture result is apparently still pending [holding for anaerobe}. From my understanding in this case this is been a progressive necrotic wound which is deteriorated markedly over the last 3 weeks since he returned here. He now has a large area of exposed bone which was biopsied and cultured last week. Dr. Graylon Good has put him on vancomycin and Fortaz during his hemodialysis and Flagyl orally. He is to see Dr. [name] next week 07/19/2017 -- the patient was reviewed by Dr. [name] of orthopedics who reviewed the case in detail and agreed with the plan to continue with IV antibiotics, aggressive wound care and hyperbaric oxygen therapy. He would see him back in 3 weeks' time 08/09/2017 -- saw Dr. Bobby Rumpf on 08/03/2017 -he was restarted on his antibiotics for 6 weeks which included vanco, ceftaz and Flagyl. he recommended continuing his antibiotics for 6 weeks and reevaluate his completion date. He would continue with wound care and hyperbaric oxygen therapy. 08/16/2017 -- I understand he will be completing his 6 weeks of antibiotics sometime later this week. 09/19/2017 -- he has been without his wound VAC for the last week due to lack of supplies. He has been packing his wound with silver alginate. 10/04/2017  -- he has an appointment with Dr. [name] tomorrow and hence we will not apply the wound VAC after his dressing changes today. 10/11/2017 -- he was seen by Dr. Bobby Rumpf on 10/05/2017, noted that the patient was currently off antibiotics, and after thorough review he recommended he follow-up with the wound care as per plan and no further antibiotics at this point. 11/29/17; patient is arrived for the wound on his left lateral foot.Marland Kitchen He is completed IV antibiotics and 2 rounds of hyperbaric oxygen for treatment of underlying osteomyelitis. He arrives today with a surface on most of the wound area however our intake nurse noted drainage from the superior aspect. This was brought to my attention. 12/13/2017 -- the right plantar foot had a large bleb and once the bleb was opened out a large callus and subcutaneous debris was removed and he has a plantar ulcer near the fourth metatarsal head 12/28/17 on evaluation  today patient appears to be doing acutely worse in regard to his left foot. The wound which has been appearing to do better as now open up more deeply there is bone palpable at the base of the wound unfortunately. He tells me that this feels "like it did when he had osteomyelitis previously" he also noted that his second toe on the left foot appears to be doing worse and is swollen there does appear to be some fluid collected underneath. His right foot plantar ulcer appears to be doing somewhat better at this point and there really is no complication at the site currently. No fevers, chills, nausea, or vomiting noted at this time. Patient states that he normally has no pain at this site however. Today he is having significant pain. 01/03/18; culture done last week showed methicillin sensitive staph aureus and group B strep. He was on Septra however he arrives today with a fever of 101. He has a functional dialysis shunt in his left arm but has had no pain here. No cough. He still makes some  urine no dysuria he did have abdominal pain nausea and diarrhea over the weekend but is not had any diarrhea since yesterday. Otherwise he has no specific complaints 01/05/18; the patient returns today in follow-up for his presentation from 1/8. At that point he was febrile. I gave him some Levaquin adjusted for his dialysis status. He tells me the fever broke that night and it is not likely that this was actually a wound infection. He is gone on to have an MRI of the left foot; this showed interval development of an abnormal signal from the base of the third and fourth metatarsal and in the cuboid reminiscent consistent with osteomyelitis. There is no mention of the left second toe I think which was a concern when it was ordered. The patient is taking his Levaquin 01/10/18; the patient has completed his antibiotics today. The area on the left lateral foot is smaller but it still probes easily to bone. He has underlying osteomyelitis here and sees Dr. Megan Salon of infectious disease this week ooThe area on the right third plantar metatarsal head still requires debridement not much change in dimensions [facility] has a new wound on the medial tip of his right third toe again this probes to bone. He only noticed this 2 days ago 01/17/18; the patient saw Dr. [name] last week and he is back on Vanco and Fortaz at dialysis. 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 We are previously seeing him for a wound also on the base of the fourth med head on the right. X- ray of this area did not show osteomyelitis in relation to a new wound on the tip of his right third toe. Again this probes to bone. Finally his second toe on the left which didn't really show anything on the MRI at least no report appears to have a separated cutaneous area which I think is going to come right off the tip of his toe and  leave exposed bone. Whether this is infectious or ischemic I am not clear Culture I did from the third toe last week which was his new wound was negative. Plain x-ray on the right foot did not show osteomyelitis in the toes 01/24/18; the patient is going went to see Dr. [name]. He is going to have a amputation of  the left second and right third toe although paradoxically the left second toe looks better than last week. We have treating a deep probing wound on the left lateral foot and the area over the third metatarsal head on the right foot. 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 Also a debridement including bone of the left lateral foot and a closure which is still sutured. This is a bit surprising. Also apparent debridement of the base of the right third toe wound. 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 I'm not sure exactly where we are in the frame of things here. The wound on the left lateral foot is still sutured also the amputation sites of the left second and right third toe. He is been using Xeroform to the sutured areas silver alginate to the right foot 02/14/18; he continues on Rocephin at dialysis for osteomyelitis. I still don't have a good sense of where we are in the treatment duration. The wounds on the left lateral foot had the sutures removed and this clearly still probes deeply. We debrided the area with a #5 curet. We'll use silver alginate to the wound oothe area over the right third met head also required debridement of callus skin and subcutaneous tissue and necrotic debris over the wound surface. This tunnels superiorly but I did not unroofed this today. ooThe areas on the tip of his toes have a crusted surface eschar I did not  debridement this either. 02/21/18; he continues on Rocephin at dialysis for osteomyelitis. I don't have a good sense of where we are and the treatment duration. The wounds on the left lateral foot is callused over on presentation but requires debridement. The area on the right third med head plantar aspect actually is measuring smaller 02/28/18;patient is on Vanco and Fortaz at dialysis, I'm not sure why I thought this was Rocephin above unless it is been changed. I don't have a good sense of time frame here. Using silver collagen to the area on the left lateral foot and right third med head plantar foot 03/07/18; patient is completing bank and Fortaz at dialysis soon. He is using Silver collagen to the area on the left lateral foot and the right third metatarsal head. He has a smaller superficial wound distal to the left lateral foot wound. 03/14/18-he is here in follow-up evaluation for multiple ulcerations to his bilateral feet. He presents with a new ulceration to the plantar aspect of the left foot underneath the blister, this was deroofed to reveal a partial thickness ulcer. He is voicing no complaints or concerns, tolerated dialysis yesterday. We will continue with same treatment plan and he will follow-up next week 03/21/18; the patient has a area on the lateral left foot which still has a small probing area. The overall surface area of the wound is better. He presented with a new ulceration on the left foot plantar fifth metatarsal head last week. The area on the right third metatarsal head appears smaller.using silver alginate to all wound areas. The patient is changing the dressing himself. He is using a Darco forefoot offloading are on the right and a healing sandal on the left 03/28/18; original wound left lateral foot. Still nowhere close to looking like a heeling surface oosecondary wound  on the left lateral foot at the base of his question fifth metatarsal which was new last  week ooRight third metatarsal head. ooWe have been using silver alginate all wounds 04/04/18; the original wound on the left lateral foot again heavy callus surrounding thick nonviable subcutaneous tissue all requiring debridement. The new wound from 2 weeks ago just near this at the base of the fifth metatarsal head still looks about the same ooUnfortunately there is been deterioration in the third medical head wound on the right which now probes to bone. I must say this was a superficial wound at one point in time and I really don't have a good frame of reference year. I'll have to go back to look through records about what we know about the right foot. 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 He is also been to see Dr. [name]. Somewhere in here as somebody is ordered a VASCULAR evaluation which was done on 03/22/18. On the right is anterior tibial artery was monophasic triphasic at the posterior tibial artery. On the left anterior tibial artery monophasic posterior tibial artery monophasic. ABI in the right was 1.43 on the left 1.21. TBIs on the right at 0.41 and on the left at 0.32. A vascular consult was recommended and I think has been arranged. 04/11/18; [name] has not had an MRI of the right foot since 2017. Recent x-ray of the right foot done in January and February was negative. However he has had a major deterioration in the wound over the third met head. He is completed antibiotics last week at dialysis [place]. I think he is going to see vascular in follow-up. The areas on the left lateral foot and left plantar fifth metatarsal head both look satisfactory. I debrided both of these areas although the tissue here looks good. The area on the left lateral foot once probe to bone it certainly does not do that now 04/18/18; MRI of the right foot is on Thursday. He has what looks  to be serosanguineous purulent drainage coming out of the wound over the right third metatarsal head today. He completed antibiotics bank and Fortaz 2 weeks ago at dialysis. He has a vascular follow-up with regards to his arterial insufficiency although I don't exactly see when that is booked. The areas on the left foot including the lateral left foot and the plantar left fifth metatarsal head look about the same. 04/25/18-He is here in follow-up evaluation for bilateral foot ulcers. MRI obtained was negative for osteomyelitis. Wound culture was negative. We will continue with same treatment plan he'll follow-up next week 05/02/18; the right plantar foot wound over the third metatarsal head actually looks better than when I last saw this. His MRI was negative for osteomyelitis wound culture was negative. ooOn the left plantar foot both wounds on the plantar fifth metatarsal head and on the lateral foot both are covered in a very hard circumferential callus. 05/09/18; right plantar foot wound over the third metatarsal head stable from last week. ooLeft plantar foot wound over the fifth metatarsal head also stable but with callus around both wound areas ooThe area on the left lateral foot had thick callus over a surface and I had some thoughts about leaving this intact however it felt boggy. ooWe've been using silver alginate all wounds 05/16/18; since the patient was last here he was hospitalized from 5/15 through 5/19. 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 This grew MRSA. He was placed on vancomycin.MRI of the left foot suggested "progressive" osteomyelitis and bone destruction of the cuboid and the base of the fifth metatarsal. Stable erosive changes of the base of the second metatarsal. I'm wondering if they're aware that he had surgery and debridement in the area of the underlying bone  previously by Dr. [name]. In any case, He was also revascularized with an angioplasty and stenting of the left tibial peroneal trunk and angioplasty of the left posterior tibial artery. He is on vancomycin at dialysis. He has a 2 week follow-up with infectious disease. He has vascular surgery follow-up. He was started on Plavix. 05/23/18; the patient's wound on the lateral left foot at the level of the fifth metatarsal head and the plantar wound on the plantar fifth metatarsal head both look better. The area on the right third metatarsal head still has depth and undermining. We've been using silver alginate. He is on vancomycin. He has been revascularized on the left 05/30/18; he continues on vancomycin at dialysis. Revascularized on the left. The area on the left lateral foot is just about closed. Unfortunately the area over the plantar fifth metatarsal head undermining superiorly as does the area over the right third metatarsal head. Both of these significantly deteriorated from last week. He has appointments next week with infectious disease and orthopedics I delayed putting on a cast on the left until those appointments which therefore we made we'll bring him in on Friday the 14th with the idea of a cast on the left foot 06/06/18; he continues on vancomycin at dialysis. Revascularized on the left. He arrives today with a ballotable swelling just above the area on the left lateral foot where his previous wound was. He has no pain but he is reasonably insensate. The difficult areas on the plantar left fifth metatarsal head looks stable whereas the area on the right third plantar metatarsal head about the same as last week there is undermining here although I did not unroofed this today. He required a considerable debridement of the swelling on the left lateral foot area and I unroofed an abscess with a copious amount of brown purulent material which I obtained for culture. I don't believe during his  recent hospitalization he had any further imaging although I need to review this. Previous cultures from this area done in this clinic showed MRSA, I would be surprised if this is not what this is currently even though he is on vancomycin. 06/13/18; he continues on vancomycin at dialysis however he finishes this on Sunday and then graduates the doxycycline previously prescribed by Dr. [name]. The abscess site on the lateral foot that I unroofed last time grew moderate amounts of methicillin-resistant staph aureus that is both vancomycin and tetracycline sensitive so we should be okay from that regard. He arrives today in clinic with a connection between the abscess site and the area on the lateral foot. We've been using silver alginate to all his wound areas. The right third metatarsal head wound is measuring smaller. Using silver alginate on both wound areas 06/20/18; transitioning to doxycycline prescribed by Dr. [name]. The abscess site on the lateral foot that I unroofed 2 weeks ago grew MRSA. That area has largely closed down although the lateral part of his wound on the fifth metatarsal head still probes to bone. I suspect  all of this was connected. ooOn the right third metatarsal head smaller looking wound but surrounded by nonviable tissue that once again requires debridement using silver alginate on both areas 06/27/18; on doxycycline 100 twice a day. The abscess on the lateral foot has closed down. Although he still has the wound on the left fifth metatarsal head that extends towards the lateral part of the foot. The most lateral part of this wound probes to bone ooRight third metatarsal head still a deep probing wound with undermining. Nevertheless I elected not to debridement this this week ooIf everything is copious static next week on the left I'm going to attempt a total contact cast 07/04/18; he is tolerating his doxycycline. The abscess on the left lateral foot is closed down  although there is still a deep wound here that probes to bone. We will use silver collagen under the total contact cast Silver collagen to the deep wound over the right third metatarsal head is well 07/11/18; he is tolerating doxycycline. The left lateral fifth plantar metatarsal head still probes deeply but I could not probe any bone. We've been using silver collagen and this will be the second week under the total contact cast ooSilver collagen to deep wound over the right third metatarsal head as well 07/18/18; he is still taking doxycycline. The left lateral fifth plantar metatarsal this week probes deeply with a large amount of exposed bone. Quite a deterioration. He required extensive debridement. Specimens of bone for pathology and CNS obtained ooAlso considerable debridement on the right third metatarsal head 07/25/18; bone for pathology last week from the lateral left foot showed osteomyelitis. CandS of the bone Enterobacter. And Klebsiella. He is now on ciprofloxacin and the doxycycline is stopped [Dr. [name] of infectious disease] area He has an appointment with Dr. [name] tomorrow I'm going to leave the total contact cast off for this week. May wish to try to reapply that next week or the week after depending on the wound bed looks. I'm not sure if there is an operative option here, previously is followed with Dr. [name] 08/01/18 on evaluation today patient presents for reevaluation. He has been seen by infectious disease, Dr. [name], and he has placed the patient back on Ceftaz currently to be given to him at dialysis three times a week. 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 Fortunately there does not appear to be the evidence of infection at this point in time. Overall the patient has been tolerating the dressing changes without complication. We are no longer due lives in the cast of left foot secondary to  the infection obviously. 08/10/18 on evaluation today patient appears to be doing rather well all things considered in regard to his left plantar foot ulcer. He does have a significant callous on the lateral portion of his left foot he wonders if I can help clean that away to some degree today. Fortunately he is having no evidence of infection at this time which is good news. No fevers chills noted. With that being said his right plantar foot actually does have a significant callous buildup around the wound opening this seems to not be doing as well as I would like at this point. I do believe that he would benefit from sharp debridement at this site. Subsequently I think a total contact cast would be helpful for the right foot as well. 08/17/18; the patient arrived today with a total  contact cast on the right foot. This actually looks quite good. He had gone to see Dr. Megan Salon of infectious disease about the osteomyelitis on the left foot. I think he is on IV Fortaz at dialysis although I am not exactly sure of the rationale for the Fortaz at the time of this dictation. He also arrived in today with a swelling on the lateral left foot this is the site of his original wounds in fact when I first saw this man when he was under Dr. Ardeen Garland care I think this was the site of where the wound was located. It was not particularly tender however using a small scalpel I opened it to [place] some moderate amount of purulent drainage. 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 Last MRI was in May that showed progressive osteomyelitis and bone destruction of the cuboid and base of the fifth metatarsal. Last week he had an abscess over this same area this was removed. Culture was negative. ooWe are continuing with a total contact cast to the area on the third metatarsal head on the right  and making some good progress here. The patient asked me again about renal transplant. He is not on the list because of the open wounds. 08/31/18; apparently the patient had an interruption in the [place] but he is now back on this at dialysis. Apparently there was a misunderstanding and Dr. Crissie Figures orders. Due to have the MRI of the left foot tonight. ooThe area on the right foot continues to have callus thick skin and subcutaneous tissue around the wound edge that requires constant debridement however the wound is smaller we are using a total contact cast in this area. Alginate all wounds 09/07/18; without much surprise the MRI of his left foot showed osteomyelitis in the reminiscent of his fourth metatarsal but also the third metatarsal. She arrives in clinic today with the right foot and a total contact cast. There was purulent drainage coming out of the wound which I have cultured. Marked deterioration here with undermining widely around the wound orifice. Using pickups and a scalpel I remove callus and subcutaneous tissue from a substantial new opening. ooIn a similar fashion the area on the left lateral foot that was blistered last week I have opened this and remove skin and subcutaneous tissue from this area to expose a obvious new wound. I think there is extension and communication between all of this on the left foot. ooThe patient is on Fortaz at dialysis. Culture done of the right foot. We are clearly not making progress here. ooWe have made him an appointment with Dr. [name] of orthopedics. I think an amputation of the left leg may be discussed. I don't think there is anything that can be done with foot salvage. 09/14/18; culture from the right foot last time showed a very resistant MRSA. This is resistant to doxycycline. I'm going to try to get linezolid at least for a week. He does not have a appointment with Dr. [name] yet. This is to go over the progress of osteomyelitis in the  left foot. 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 I'll send a message to Dr. [name] of infectious disease. I want the patient to see Dr. [name] to go over the pros and cons of an amputation which I think will be a BKA. The patient had a question about whether this is  curative or not. I told him that I thought it would be although spread of staph aureus infection is not unheard of. MRI of the right foot was done at the end of April 2019 did not show osteomyelitis. The patient last saw Dr. [name] on 9/3. I'll send Dr. [name] a message. I would really like him to weigh the pros and cons of a BKA on the left otherwise he'll probably need IV antibiotics and perhaps hyperbaric oxygen again. He has not had a good response to this in the past. His MRI earlier this month showed progressive damage in the remnants of the fourth and third metatarsal 09/21/2018; sees Dr. [name] of orthopedics next week to discuss a left BKA in response to the osteomyelitis in the left foot. Using silver alginate to all wound areas. He is completing the Zyvox I did put in for him after last culture showed MRSA 09/29/2018; patient saw Dr. [name] of orthopedics discussed the osteomyelitis in the left foot third and fourth metatarsals. He did not recommend urgent surgery but certainly stated the only surgical option would be a BKA. He is communicating with Dr. [name]. The problem here is the instability of the areas on his foot which constantly generate draining abscesses. I will communicate with Dr. [name] about this. He has an appointment on 10/23 10/05/2018; patient sees Dr. [name] on 10/23. I have sent him a secure message not ordered any additional antibiotics for now. Patient continues to have 2 open areas on the left plantar foot at the fifth metatarsal head and the lateral aspect of the foot. These have not changed all that much. The area on the right third met head still has thick callus and surrounding subcutaneous  tissue we have been using silver alginate 10/12/2018; patient sees Dr. [name] or colleague on 10/23. 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 The area on the right plantar foot is just about closed. The superior wound on the left fifth metatarsal head is callused over but I am not sure if this is closed. The area below it is about the same. Considerable amount of callus on the lateral foot. We have been using silver alginate to the wounds 10/19/2018; the patient saw Dr. [name] on 10/22. He wishes to try and continue to save the left foot. He has been given 6 weeks of oral ciprofloxacin. We continue to put a total contact cast on the right foot. The area over the fifth metatarsal head on the left is callused over/perhaps healed but I did not remove the callus to find out. He still has the wound on the left lateral midfoot requiring debridement. We have been using silver alginate to all wound areas 10/26/2018 ooOn the left foot our intake nurse noted some purulent drainage from the inferior wound [currently the only one that is not callused over] ooOn the right foot even though he is in total contact cast a considerable amount of thick black callus and surface eschar. On this side we have been using silver alginate under a total contact cast. oohe remains on ciprofloxacin as prescribed by Dr. [name] 11/02/2018; culture from last week which is done of the probing area on the left midfoot wound showed MRSA "few". I am going to need to contact Dr. [name] which I will do today I think he is going to need IV antibiotics again. The area on the left foot which was callused on the side is clearly  separating today all of this was removed a copious amounts of callus and necrotic subcutaneous tissue. ooThe area on the right plantar foot actually remained healthy looking with a healthy granulated base. ooWe are using silver alginate to  all wound areas 11/09/2018; unfortunately neither 1 of the patient's wounds areas looks at all satisfactory. On the left he has considerable necrotic debris from the plantar wound laterally over the foot. I removed copious amounts of material including callus skin and subcutaneous tissue. On the right foot he arrives with undermining and frankly purulent drainage. Specimen obtained for culture and debridement of the callused skin and subcutaneous tissue from around the circumference. Because of this I cannot put him back in a total contact cast but to be truthful we are really unfortunately not making a lot of progress I did put in a secure text message to Dr. [name] wondering about the MRSA on the left foot. He suggested vancomycin although this is not started. I was left wondering if he expected me to send this into dialysis. Has we have more purulence on the right side wait for that result before calling dialysis 11/16/2018; I called dialysis earlier this week to get the vancomycin started. I think he got the first dose on Tuesday. The patient tells me that he fell earlier this week twisting his left foot and ankle and he is swelling. He continues to not look well. He had blood cultures done at dialysis on Tuesday he is not been informed of the results. 12/07/2018; the patient was admitted to hospital from 11/22/2018 through 12/02/2018. He underwent a left BKA. Apparently had staph sepsis. In the meantime being off the right foot this is closed over. He follows up with Dr. 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 Subsequent to this he actually had multiple falls in fact he tells me to following which calls the wound to be his apparently. He has been seeing Dr. [name] and his physician assistant in the interim. They actually did want him to come back for reevaluation to see if  there's anything we can do for me wound care perspective the help this area to heal more appropriately. The patient states that he does not have a tremendous amount of pain which is good news. No fevers, chills, nausea, or vomiting noted at this time. We have gotten approval from Dr. [name] office had [place] for [place] to treat the patient for his stop wound. This is due to the fact that the patient was in the 90 day postop global. 1/21; the patient does not have an open wound on the right foot although he does have some pressure areas that will need to be padded when he is transferring. The dehisced surgical wound looks clean although there is some undermining of 1.5 cm superiorly. 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 This appears healthy but there is undermining. TheraSkin #1 2/11; TheraSkin #2 2/25; TheraSkin #3. Wound is measuring smaller 3/10; TheraSkin #4 wound is measuring smaller 3/24; TheraSkin #5 wound is measuring smaller and looks healthy. 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 He has no open area on the right foot although he does have some callused area over the original third metatarsal head wound. The third metatarsal is also subluxed on the right foot.  I have warned him today that he cannot consider wearing a prosthesis for at least a month but he can go for measurements. He is going to need to keep the right foot padded is much as possible in his diabetic shoe indefinitely READMISSION 06/12/2019 [name] is a type II diabetic on dialysis. He has been in this clinic multiple times with wounds on his bilateral lower extremities. 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 He also has a history of wounds on his right foot but tells me that everything is going  well here. He actually came in here with his prosthesis walking with a cane. We were all really quite gratified to see this He states over the last several weeks he has had a painful area at the tip of the left third finger. His dialysis shunt in his is in the left upper arm and this particularly hurts during dialysis when his fingers get numb and there is a lot of pain in the tip of his left third finger. I believe he is seen vascular surgery. He had a Doppler done to evaluate for a dialysis steal syndrome. He is going for a banding procedure 1 week from today towards the left AV fistula. I think if that is unsuccessful they will be looking at creating a new shunt. He had a course of doxycycline when he which he finished about a week ago. He has been using Bactroban to the finger 6/25; the patient had his banding procedure and things seem to be going better. He is not having pain in his hand at dialysis. The area on the tip of his finger seems about closed. He did however have a paronychia I the last time I saw him. I have been advising him to use topical Bactroban washing the finger. Culture I did last time showed a few staph epidermidis which I was willing to dismiss as superficial skin contaminant. He arrives today with the finger wound looking better however the paronychia a seems worse 7/9; 2-week follow-up. He does not have an open wound on the tip of his third left finger. I thought he had an initial ischemic wound on the tip of the finger as well as a paronychia a medially. All of this appears to be closed. 8/6-Patient returns after 3 weeks for follow-up and has a right second met head plantar callus with a significant area of maceration hiding an ulcer ABI repeated today on right - 1.26 8/13-Patient returns at 1 week, the right second met head plantar wound appears to be slightly better, it is surrounded by the callus area we are using silver alginate 8/20; the patient came back to  clinic 2 weeks ago with a wound on the right second metatarsal head. He apparently told me he developed callus in this area but also went away from his custom shoes to a pair of running shoes. Using silver alginate. 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 There might be much we can do to offload this although the patient tells me he has a wheelchair that he is using to try and stay off the wound is much as possible 8/27; right second metatarsal head. Still small punched out area with thick callus and subcutaneous tissue around the wound. We have been using silver collagen. This is always been an issue with this  man's wound especially on this foot 9/3; right second met head. Still the same punched-out area with thick callus and subcutaneous tissue around the wound removing the circumference demonstrates repetitively undermining area. I have removed all the subcutaneous tissue associated with this. We have been using silver collagen 9/15; right second metatarsal head. Same punched-out thick callus and subcutaneous tissue around the wound area. Still requiring debridement we have been using silver collagen I changed back to silver alginate X-ray last time showed no evidence of osteomyelitis. Culture showed Klebsiella and he was started and Keflex apparently just 4 days ago 9/24; right second metatarsal head. He is completed the antibiotics I gave him for 10 days. Same punched-out thick callus and subcutaneous tissue around the area. Still requiring debridement. We have been using silver alginate. The area itself looks swollen and this could be just [place] that comes on this area with walking on a prosthesis on the left however I think he needs an MRI and I am going to order that today. There is not an option to offload this further 10/1; right second plantar metatarsal head. MRI booked for October 6. Generally looking better today using silver alginate 10/8; right second  plantar metatarsal head. MRI that was done on 10/6 was negative for osteomyelitis noted prior amputations of the second and fourth toes at the [facility]. Prior amputation of the third toe to the base of the middle phalanx. Prior amputation of the fifth toe to the head of the metatarsal. 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 He does not recall about hearing about this. He did not have any pain but then again he has reduced sensation from diabetic neuropathy 10/15; right second plantar metatarsal head. Still in the same condition callus nonviable subcutaneous tissue which cleans up nicely with debridement but reforms by the next week. The patient tells me that he is offloading this is much as he can including taking his wheelchair to dialysis. We have been using silver alginate, changed to silver collagen today 10/22; right second plantar metatarsal head. Wound measures smaller but still thick callus around this wound. Still requiring debridement 11/5 right second plantar metatarsal head. Less callus around the wound circumference but still requiring debridement. There is still some undermining which I cleaned out the wound looks healthy but still considerable punched out depth with a relatively small circumference to the wound there is no palpable bone no purulent drainage 11/12; right second plantar metatarsal head. Small wound with thick callus tissue around the wound and significant undermining. We have been using silver collagen after my continuous debridements of this area 11/19; patient arrives in today with purulent drainage coming out of the wound in the second third met head area on the right foot. Serosanguineous thick drainage. Specimen obtained for culture. 11/21/2019 on evaluation today patient appears to be doing well with regard to his foot ulcer. The good news is is culture came back negative for any bacteria there was no growth. The  other good news is his x-ray also appears to be doing well. The foot is also measuring much better than what it was. Overall I am very pleased with how things seem to be progressing. 12/22; patient was in [facility] for several weeks due to the death of a family member. He said he stayed off the wound using silver alginate. 01/03/2020. Patient has a small open area with roughly 0.3 cm of circumferential undermining. The orifice looks smaller. We  have been using silver collagen. There is not a wound more aggressive way to offload this area as he has a prosthesis on the other leg 1/21; again the same small area is 2 weeks ago. We have been using silver collagen I change that to endoform today I really believe that this is an offloading issue 2/4; the area on the right third met head. We have been using endoform. 2/11 the area on the right third met head we have been using silver alginate. 2/25; the area on the right third met head. There is much less callus on this today. The patient states he is offloading this even more aggressively. As an example he is taking his wheelchair into dialysis on most days Objective Constitutional Sitting or standing Blood Pressure is within target range for patient.. Pulse regular and within target range for patient.Marland Kitchen Respirations regular, non-labored and within target range.. Temperature is normal and within the target range for the patient.Marland Kitchen Appears in no distress. Vitals Time Taken: 1:57 PM, Height: 73 in, Weight: 202 lbs, BMI: 26.6, Temperature: 98.1 F, Pulse: 78 bpm, Respiratory Rate: 18 breaths/min, Blood Pressure: 104/51 mmHg, Capillary Blood Glucose: 124 mg/dl. General Notes: glucose per pt report General Notes: Wound exam; proved looking wound surface less callus. Still debrided with a #3 curette callus subcutaneous debris. This is a small wound with 0.4 cm roughly in depth. There is no palpable bone and no evidence of infection Integumentary  (Hair, Skin) Wound #41 status is Open. Original cause of wound was Gradually Appeared. The wound is located on the Right Metatarsal head second. The wound measures 0.3cm length x 0.3cm width x 0.8cm depth; 0.071cm^2 area and 0.057cm^3 volume. There is Fat Layer (Subcutaneous Tissue) Exposed exposed. There is no tunneling noted, however, there is undermining starting at 12:00 and ending at 12:00 with a maximum distance of 0.4cm. There is a medium amount of serosanguineous drainage noted. The wound margin is well defined and not attached to the wound base. There is large (67-100%) pink granulation within the wound bed. There is no necrotic tissue within the wound bed. 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 There was a Excisional Skin/Subcutaneous Tissue Debridement with a total area of 0.25 sq cm performed by Ricard Dillon., MD. With the following instrument(s): Curette to remove Viable and Non-Viable tissue/material. Material removed includes Callus, Subcutaneous Tissue, Skin: Dermis, and Fibrin/Exudate after achieving pain control using Lidocaine 4% Topical Solution. A time out was conducted at 14:25, prior to the start of the procedure. A Minimum amount of bleeding was controlled with Pressure. The procedure was tolerated well with a pain level of 0 throughout and a pain level of 0 following the procedure. Post Debridement Measurements: 0.3cm length x 0.3cm width x 0.8cm depth; 0.057cm^3 volume. Character of Wound/Ulcer Post Debridement is improved. Severity of  Tissue Post Debridement is: Fat layer exposed. Post procedure Diagnosis Wound #41: Same as Pre-Procedure Plan Follow-up Appointments: Return Appointment in 1 week. - Thursday Dressing Change Frequency: Wound #41 Right Metatarsal head second: Change Dressing every other day. Skin Barriers/Peri-Wound Care: Barrier cream - as needed for maceration, wetness, or redness.  Wound Cleansing: May shower and wash wound with soap and water. Primary Wound Dressing: Wound #41 Right Metatarsal head second: Calcium Alginate with Silver - pack into wound bed. Secondary Dressing: Wound #41 Right Metatarsal head second: Foam - foam donut Kerlix/Rolled Gauze Dry Gauze Other: - felt to periwound and place one below wound. lightly wrap coban to hold dressing in place. felt / pad wound Edema Control: Avoid standing for long periods of time Elevate legs to the level of the heart or above for 30 minutes daily and/or when sitting, a frequency of: - throughout the day. Off-Loading: Other: - felt to periwound and place one below wound. Additional Orders / Instructions: Other: - minimize walking and standing on foot to aid in wound healing and callus buildup. 1. Continuing with silver alginate packing 2. The wound is too early too small for medical surface area point of view to use any form of formal advanced treatment product. ReGranix would probably not be covered by his insurance. 3. We do not have a good option for formal offloading secondary to a prosthesis on the other side Electronic Signature(s) Signed: 02/21/2020 5:41:09 PM By: Linton Ham MD Entered By: Linton Ham on 02/21/2020 14:48:44 -------------------------------------------------------------------------------- SuperBill Details Patient Name: Date of Service: [name], [name] 02/21/2020 Medical Record [id number] Patient Account Number: [id number] Date of Birth/Sex: Treating RN: 1951/05/24 (69 y.o. [name],  Meta.Reding Primary Care Provider: [name] Other Clinician: Referring Provider: Treating Provider/Extender:Zedrick Springsteen, Jeronimo Greaves, Buena Irish in Treatment: 36 Diagnosis Coding ICD-10 Codes Code Description E11.621 Type 2 diabetes mellitus with foot ulcer L97.512 Non-pressure chronic ulcer of other part of right foot with fat layer exposed I70.298 Other atherosclerosis of native arteries of extremities, other extremity L03.115 Cellulitis of right lower limb Facility Procedures The patient participates with Medicare or their insurance follows the Medicare Facility Guidelines: CPT4 Code Description Modifier Quantity [id number] - DEB SUBQ TISSUE 20 SQ CM/< 1 ICD-10 Diagnosis Description L97.512 Non-pressure chronic ulcer of  other part of right foot with fat layer exposed E11.621 Type 2 diabetes mellitus with foot ulcer Physician Procedures CPT4 Code Description: [id number] - WC PHYS SUBQ TISS 20 SQ CM ICD-10 Diagnosis Description L97.512 Non-pressure chronic ulcer of other part of right foot with E11.621 Type 2 diabetes mellitus with foot ulcer Modifier: fat layer e Quantity: 1 xposed Electronic Signature(s) Signed: 02/21/2020 5:41:09 PM By: Linton Ham MD Entered By: Linton Ham on 02/21/2020 14:49:01
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
## 2020-02-28 ENCOUNTER — Other Ambulatory Visit (HOSPITAL_COMMUNITY)
Admission: RE | Admit: 2020-02-28 | Discharge: 2020-02-28 | Disposition: A | Discharge status: Home / Self Care | Payer: Medicare Other | Source: Ambulatory Visit | Attending: Internal Medicine | Admitting: Internal Medicine

## 2020-02-28 ENCOUNTER — Encounter (HOSPITAL_BASED_OUTPATIENT_CLINIC_OR_DEPARTMENT_OTHER): Payer: Medicare Other | Attending: Internal Medicine | Admitting: Internal Medicine

## 2020-02-28 ENCOUNTER — Other Ambulatory Visit: Payer: Self-pay

## 2020-02-28 DIAGNOSIS — L97512 Non-pressure chronic ulcer of other part of right foot with fat layer exposed: Secondary | ICD-10-CM | POA: Insufficient documentation

## 2020-02-28 DIAGNOSIS — Z992 Dependence on renal dialysis: Secondary | ICD-10-CM | POA: Diagnosis not present

## 2020-02-28 DIAGNOSIS — N189 Chronic kidney disease, unspecified: Secondary | ICD-10-CM | POA: Insufficient documentation

## 2020-02-28 DIAGNOSIS — E11621 Type 2 diabetes mellitus with foot ulcer: Secondary | ICD-10-CM | POA: Insufficient documentation

## 2020-02-28 DIAGNOSIS — L03115 Cellulitis of right lower limb: Secondary | ICD-10-CM | POA: Insufficient documentation

## 2020-02-28 DIAGNOSIS — I70208 Unspecified atherosclerosis of native arteries of extremities, other extremity: Secondary | ICD-10-CM | POA: Diagnosis not present

## 2020-02-28 DIAGNOSIS — I519 Heart disease, unspecified: Secondary | ICD-10-CM | POA: Insufficient documentation

## 2020-02-28 DIAGNOSIS — I70298 Other atherosclerosis of native arteries of extremities, other extremity: Secondary | ICD-10-CM | POA: Diagnosis not present

## 2020-02-28 DIAGNOSIS — E1151 Type 2 diabetes mellitus with diabetic peripheral angiopathy without gangrene: Secondary | ICD-10-CM | POA: Diagnosis not present

## 2020-02-28 NOTE — Progress Notes (Addendum)
SHAHRAM, ALEXOPOULOS ([id number]) Visit Report for 02/28/2020 Debridement Details Patient Name: Date of Service: IAIN, SAWCHUK 02/28/2020 12:30 PM Medical Record [id number] Patient Account Number: [id number] Date of Birth/Sex: 02-09-1951 (69 y.o. M) Treating RN: Deon Pilling Primary Care Provider: [name] Other Clinician: Referring Provider: Treating Provider/Extender:Aleeza [place], Birdena Crandall in Treatment: 37 Debridement Performed for Wound #41 Right Metatarsal head second Assessment: Performed By: Physician Ricard Dillon., MD Debridement Type: Debridement Severity of Tissue Pre Fat layer exposed Debridement: Level of Consciousness (Pre- Awake and Alert procedure): Pre-procedure Verification/Time Out Taken: Yes - 13:40 Start Time: 13:41 Pain Control: Lidocaine 4% Topical Solution Total Area Debrided (L x W): 0.6 (cm) x 0.6 (cm) = 0.36 (cm) Tissue and other material Viable, Non-Viable, Callus, Subcutaneous, Skin: Dermis , Fibrin/Exudate debrided: Level: Skin/Subcutaneous Tissue Debridement Description: Excisional Instrument: Curette Bleeding: Minimum Hemostasis Achieved: Pressure End Time: 13:45 Procedural Pain: 0 Post Procedural Pain: 0 Response to Treatment: Procedure was tolerated well Level of Consciousness Awake and Alert (Post-procedure): Post Debridement Measurements of Total Wound Length: (cm) 0.6 Width: (cm) 0.6 Depth: (cm) 0.8 Volume: (cm) 0.226 Character of Wound/Ulcer Post Requires Further Debridement Debridement: Severity of Tissue Post Debridement: Fat layer exposed Post Procedure Diagnosis Same as Pre-procedure Electronic Signature(s) Signed: 02/28/2020 5:41:03 PM By: Linton Ham MD Signed: 02/28/2020 6:05:23 PM By: Deon Pilling Entered By: Linton Ham on 02/28/2020 14:05:11 -------------------------------------------------------------------------------- HPI Details Patient Name: Date of Service: BLAYKE, CORDREY  02/28/2020 12:30 PM Medical Record [id number] Patient Account Number: [id number] Date of Birth/Sex: Treating RN: 01-25-1951 (69 y.o. [name] Primary Care Provider: [name] Other Clinician: Referring Provider: Treating Provider/Extender:Rochell Mabie, Jeronimo Greaves, Buena Irish in Treatment: 37 History of Present Illness Location: Patient presents with a wound to bilateral feet. Quality: Patient reports experiencing essentially no pain. Severity: Mildly severe wound with no evidence of infection Duration: Patient has had the wound for greater than 2 weeks prior to presenting for treatment HPI Description: The patient is a pleasant 69 yrs old bm here for evaluation of ulcers on the plantar aspect of both feet. 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 He has a left arm graft for access. He has been trying to stay off his feet for weeks but does not seem to have any improvement in the wound. He has been seeing someone at the foot center and was referred to the Wound Care center for further evaluation. 12/30/15 the patient has 3 wounds one over the right first metatarsal head and 2 on the left foot at the left fifth and lleft first metatarsal head. All of these look relatively similar. The one over the left fifth probe to bone I could not prove that any of the others did. I note his MRI in November that did not show osteomyelitis. His peripheral pulses seem robust. All of these underwent surgical debridement to remove callus nonviable skin and subcutaneous tissue 01/06/16; the patient had his sutures removed from the right fifth ray amputation. There may be a small open part of this superiorly but otherwise the incision looks good. Areas over his right first, left first and left fifth metatarsal head all underwent surgical debridement as a varying degree of callus, skin and nonviable subcutaneous tissue. The area that is most  worrisome is the right fifth metatarsal head which has a wound probes precariously close to bone. There is no purulent drainage or erythema 01/13/16; I'm not exactly sure of the status of the  right fifth ray amputation site however he follows with Dr. [name] later this week. The area over his right first plantar metatarsal head, left first and fifth plantar metatarsal head are all in the same status. Thick circumferential callus, nonviable subcutaneous tissue. Culture of the left fifth did not culture last week 01/20/16; all of the patient's wounds appear and roughly the same state although his amputation site on the right lateral foot looks better. His wounds over the right first, left first and left fifth metatarsal heads all underwent difficult surgical debridement removing circumferential callus nonviable skin and subcutaneous tissue. There is no overt evidence of infection in these areas. MRI at the end of December of the left foot did not show osteomyelitis, right foot showed osteomyelitis of the right fifth digit he is now status post amputation. 01/27/16 the patient's wounds over his plantar first and fifth metatarsal heads on the left all appear better having been started on a total contact cast last week. They were debridement of circumferential callus and nonviable subcutaneous tissue as was the wound over the first metatarsal head. His surgical incision on the right also had some light surface debridement done. 03/23/2016 -- the patient was doing really well and most of his wounds had almost completely healed but now he came back today with a history of having a discharge from the area of his right foot on the plantar aspect and also between his first and second toe. He also has had some discharge from the left foot. Addendum: I spoke to the PA [name] at the dialysis center whose fax number is [phone]. We discussed the infection the patient has and she will put the  patient on vancomycin and Fortaz until the final culture report is back. We will fax this as soon as available. 03/30/2016 -- left foot x-ray IMPRESSION:No definitive osteomyelitis noted. X-ray of the right foot -- IMPRESSION: 1. Soft tissue swelling. Prior amputation right fifth digit. No acute or focal bony abnormality identified. If osteomyelitis remains a clinical concern, MRI can be obtained. 2. Peripheral vascular disease. His culture reports have grown an MSSA -- and we will fax this report to his hemodialysis center. He was called in a prescription of oral doxycycline but I have told her not to fill this in as he is already on IV antibiotics. 04/06/2016 -- a few days ago, I spoke to the hemodialysis center nurse who had stopped the IV antibiotics and he was given a prescription for doxycycline 100 mg by mouth twice a day for a week and he is on this at the present time. 05/11/2016 -- he has recently seen his PCP this week and his hemoglobin A1c was 7. He is working on his paperwork to get his orthotic shoes. 05/25/2016 -- -- x-ray of the left foot IMPRESSION:No acute bony abnormality. No radiographic changes of acute osteomyelitis. No change since prior study. X-ray of the right foot -- IMPRESSION: Postsurgical changes are seen involving the fifth toe. No evidence of acute osteomyelitis. he developed a large blister on the medial part of his right foot and this opened out and drain fluid. 06/01/2016 -- he has his MRI to be done this afternoon. 06/15/2016 -- MRI of the left forefoot without contrast shows 2 separate regions of cutaneous and subcutaneous edema and possible ulceration and blistering along the ball of the foot. No obvious osteomyelitis identified. MRI of the right foot showed cutaneous and subcutaneous thickening plantar to the first digit sesamoid with an ulcer crater  but no underlying osteomyelitis is identified. 06/22/2016 -- the right foot plantar ulcer has been  draining a lot of seropurulent material for the last few days. 06/30/2016 -- spoke to the dialysis center and I believe I spoke to [place] a PA at the center who discussed with me and agreed to putting Legrand Como on vancomycin until his cultures arrive. On review of his culture report no WBCs were seen or no organisms were seen and the culture was reincubated for better growth. The final report is back and there were no predominant growth including Streptococcus or Staphylococcus. Clinically though he has a lot of drainage from both wounds a lot of undermining and there is further blebs on the left foot towards the interspace between his first and second toe. 08/10/2016 -- the culture from the right foot showed normal skin flora and there was no Staphylococcus aureus was group A streptococcus isolated. 09/21/2016 -- -- MRI of the right foot was done on 09/13/2016 - IMPRESSION: Findings most consistent with acute osteomyelitis throughout the great toe, sesamoid bones and plantar aspect of the head of the first metatarsal. Fluid in the sheath of the flexor tendon of the great toe could be sympathetic but is worrisome for septic tenosynovitis. First MTP joint effusion worrisome for septic joint. He was admitted to the hospital on 09/11/2016 and treated for a fever with vancomycin and cefepime. He was seen by Dr. Bobby Rumpf of infectious disease who recommended 6 weeks treatment with vancomycin and ceftazidime with his hemodialysis and to continue to see as in the wound clinic. Vascular consult was pending. The patient was discharged home on 09/14/2016 and he would continue with IV antibiotics for 6 weeks. 09/28/16 wound appears reasonably healthy. Continuing with total contact cast 10/05/16 wound is smaller and looking healthy. Continue with total contact cast. He continues on IV antibiotics 10/12/2016 -- he has developed a new wound on the dorsal aspect of his left big toe and this is a  superficial injury with no surrounding cellulitis. He has completed 30 days of IV antibiotics and is now ready to start his hyperbaric oxygen therapy as per his insurance company's recommendation. 10/19/2016 -- after the cast was removed on the right side he has got good resolution of his ulceration on the right plantar foot. he has a new wound on the left plantar foot in the region of his fourth metatarsal and this will need sharp debridement. 10/26/2016 -- Xray of the right foot complete: IMPRESSION: Changes consistent with osteomyelitis involving the head of the first metatarsal and base of the first proximal phalanx. The sesamoid bones are also likely involved given their positioning. 11/02/2016 -- there still awaiting insurance clearance for his hyperbaric oxygen therapy and hopefully he will begin treatment soon. 11/09/2016 -- started with hyperbaric oxygen therapy and had some barotrauma to the right ear and this was seen by ENT who was prescribed Afrin drops and would probably continue with HBO and he is scheduled for myringotomy tubes on Friday 11/16/2016 -- he had his myringotomy tubes placed on Friday and has been doing much better after that with some fluid draining out after hyperbaric treatment today. The pain was much better. 11/30/2016 -- over the last 2 days he noticed a swelling and change of color of his right second toe and this had been draining minimal fluid. 12/07/2016 -- x-ray of the right foot -- IMPRESSION:1. Progressive ulceration at the distal aspect of the second digit with significant soft tissue swelling and osseous changes in  the distal phalanx compatible with osteomyelitis. 2. Chronic osteomyelitis at the first MTP joint. 3. Ulcerations at the second and third toes as well without definite osseous changes. Osteomyelitis is not excluded. 4. Fifth digit amputation. On 12/01/2016 I spoke to Dr. Bobby Rumpf, the infectious disease specialist, who kindly agreed  to treat this with IV antibiotics and he would call in the order to the dialysis center and this has been discussed in detail with the patient who will make the appropriate arrangements. The patient will also book an appointment as soon as possible to see Dr. [name] in the office. Of note the patient has not been on antibiotics this entire week as the dialysis center did not receive any orders from Dr. [name]. I got in touch with Dr. [name] who tells me the patient has an appointment to see him this coming Wednesday. 12/14/2016 -- the patient is on ceftazidime and vancomycin during his dialysis, and I understand this was put on by his nephrologist Dr. Raliegh Ip possibly after speaking with infectious disease Dr. [name] 12/23/16; patient was on my schedule today for a wound evaluation as he had difficulties with his schedule earlier this week. I note that he is on vancomycin and ceftazidine at dialysis. In spite of this he arrives today with a new wound on the base of the right second toe this easily probes to bone. The known wound at the tip of the second toe With this area. He also has a superficial area on the medial aspect of the third toe on the side of the DIP. This does not appear to have much depth. The area on the plantar left foot is a deep area but did not probe the bone 12/28/16 -- he has brought in some lab work and the most recent labs done showed a hemoglobin of 12.1 hematocrit of 36.3, neutrophils of 51% WBC count of 5.6, BN of 53, albumin of 4.1 globulin of 3.7, vancomycin 13 g per mL. 01/04/2017 -- he saw Dr. [name] who has recommended a amputation of the right second toe and he is awaiting this date. He sees Dr. Bobby Rumpf of infectious disease tomorrow. The bone culture taken on 12/28/2016 had no growth in 2 days. 01/11/2017 -- was seen by Dr. Bobby Rumpf regarding the management and has recommended a eval by vascular surgeons. He recommended to continue the antibiotics  during his hemodialysis. Xray of the left foot -- IMPRESSION: No acute fracture, dislocation, or osseous erosion identified. 01/18/2017 -- he has his vascular workup later today and he is going to have his right foot second toe amputation this coming Friday by Dr. [name]. We have also put in for a another 30 treatments with hyperbaric oxygen therapy 01/25/2017 -- I have reviewed Dr. Donnetta Hutching is vascular report from last week where he reviewed him and thought his vascular function was good enough to heal his amputation site and no further tests were recommended. He also had his orthopedic related to surgery which is still pending the notes and we will review these next week. 02/01/2017 -- the operative remote of Dr. 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 02/08/2017 --he was seen by Dr. [name] on 02/03/2017 --  patient is on ceftaz and Vanco. after a thorough review he had recommended to continue with antibiotics during hemodialysis. The patient was seen by Dr. [name], but I do not find any follow-up note on the electronic medical record. he has removed the dressing over the right foot to remove the sutures and asked him to see him back in a month's time 02/22/2017 -- patient has been febrile and has been having symptoms of the upper respiratory tract infection but has not been checked for the flu and it's been over 4 days now. He says he is feeling better today. Some drainage between his left first and second toe and this needed to be looked at 03/08/2017 -- was seen by Dr. Bobby Rumpf on 03/07/2017 after review he will stop his antibiotics  and see him back in a month to see if he is feeling well. 03/15/2017 -- he has completed his course of hyperbaric oxygen therapy and is doing fine with his health otherwise. 03/22/2017 -- his nutritionist at the dialysis center has recommended a protein supplement to help build his collagen and we will prescribe this for him when he has the details 05/03/2017 -- he recently noticed the area on the plantar aspect of his fifth metatarsal head which opened out and had minimal drainage. 05/10/2017 -- -- x-ray of the left foot -- IMPRESSION:1. No convincing conventional radiographic evidence of active osteomyelitis.2. Active soft tissue ulceration at the tip of the fifth digit, and at the lateral aspect of the foot adjacent the base of the fifth metatarsal. 3. Surgical changes of prior fourth toe amputation. 4. 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 5. Small vessel atherosclerotic vascular calcifications. 6. 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 ======= Readmission after 5 weeks: 06/15/2017 -- the patient returns after 5 weeks having had an MRI done on 05/17/2017 MRI of the left foot without contrast showed soft tissue ulcer overlying the base of the fifth metatarsal. 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 No drainable fluid collection to suggest an abscess. He was in hospital between 05/16/2017 and 05/25/2017 -- and on discharge was asked to follow-up with Dr. [name] and Dr. [name]. He was treated 2 weeks post discharge with vancomycin plus ceftriaxone with hemodialysis and oral metronidazole 500 mg 3 times a day. 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 last hemoglobin A1c was 6.7% He had a postoperative MR of the foot on 05/23/2017 -- which showed no new areas of cortical bone  loss, edema or soft tissue ulceration to suggest osteomyelitis. the patient has completed his course of IV antibiotics during dialysis and is to see the infectious disease doctor tomorrow. Dr. [name] had seen him after suture removal and asked him to keep the wound with a dry dressing 06/21/2017 -- he was seen by Dr. Bobby Rumpf of infectious disease on 06/16/2017 -- he stopped his Flagyl and will see him back in 6 weeks. He was asked to follow-up with Dr. [name] and with the wound clinic clinic. 07/05/17; bone biopsy from last time showed acute osteomyelitis. This is from the reminiscent in the left fifth metatarsal. Culture result is apparently still pending [holding for anaerobe}. From my understanding in this case this is been a progressive necrotic wound which is deteriorated markedly over the last 3 weeks since he returned here. He now  has a large area of exposed bone which was biopsied and cultured last week. Dr. Graylon Good has put him on vancomycin and Fortaz during his hemodialysis and Flagyl orally. He is to see Dr. [name] next week 07/19/2017 -- the patient was reviewed by Dr. [name] of orthopedics who reviewed the case in detail and agreed with the plan to continue with IV antibiotics, aggressive wound care and hyperbaric oxygen therapy. He would see him back in 3 weeks' time 08/09/2017 -- saw Dr. Bobby Rumpf on 08/03/2017 -he was restarted on his antibiotics for 6 weeks which included vanco, ceftaz and Flagyl. he recommended continuing his antibiotics for 6 weeks and reevaluate his completion date. He would continue with wound care and hyperbaric oxygen therapy. 08/16/2017 -- I understand he will be completing his 6 weeks of antibiotics sometime later this week. 09/19/2017 -- he has been without his wound VAC for the last week due to lack of supplies. He has been packing his wound with silver alginate. 10/04/2017 -- he has an appointment with Dr. [name] tomorrow and hence we  will not apply the wound VAC after his dressing changes today. 10/11/2017 -- he was seen by Dr. Bobby Rumpf on 10/05/2017, noted that the patient was currently off antibiotics, and after thorough review he recommended he follow-up with the wound care as per plan and no further antibiotics at this point. 11/29/17; patient is arrived for the wound on his left lateral foot.Marland Kitchen He is completed IV antibiotics and 2 rounds of hyperbaric oxygen for treatment of underlying osteomyelitis. He arrives today with a surface on most of the wound area however our intake nurse noted drainage from the superior aspect. This was brought to my attention. 12/13/2017 -- the right plantar foot had a large bleb and once the bleb was opened out a large callus and subcutaneous debris was removed and he has a plantar ulcer near the fourth metatarsal head 12/28/17 on evaluation today patient appears to be doing acutely worse in regard to his left foot. The wound which has been appearing to do better as now open up more deeply there is bone palpable at the base of the wound unfortunately. He tells me that this feels "like it did when he had osteomyelitis previously" he also noted that his second toe on the left foot appears to be doing worse and is swollen there does appear to be some fluid collected underneath. His right foot plantar ulcer appears to be doing somewhat better at this point and there really is no complication at the site currently. No fevers, chills, nausea, or vomiting noted at this time. Patient states that he normally has no pain at this site however. Today he is having significant pain. 01/03/18; culture done last week showed methicillin sensitive staph aureus and group B strep. He was on Septra however he arrives today with a fever of 101. He has a functional dialysis shunt in his left arm but has had no pain here. No cough. He still makes some urine no dysuria he did have abdominal pain nausea and diarrhea  over the weekend but is not had any diarrhea since yesterday. Otherwise he has no specific complaints 01/05/18; the patient returns today in follow-up for his presentation from 1/8. At that point he was febrile. I gave him some Levaquin adjusted for his dialysis status. He tells me the fever broke that night and it is not likely that this was actually a wound infection. He is gone on to have an MRI of  the left foot; this showed interval development of an abnormal signal from the base of the third and fourth metatarsal and in the cuboid reminiscent consistent with osteomyelitis. There is no mention of the left second toe I think which was a concern when it was ordered. The patient is taking his Levaquin 01/10/18; the patient has completed his antibiotics today. The area on the left lateral foot is smaller but it still probes easily to bone. He has underlying osteomyelitis here and sees Dr. Megan Salon of infectious disease this week The area on the right third plantar metatarsal head still requires debridement not much change in dimensions He has a new wound on the medial tip of his right third toe again this probes to bone. He only noticed this 2 days ago 01/17/18; the patient saw Dr. [name] last week and he is back on Vanco and Fortaz at dialysis. 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 We are previously seeing him for a wound also on the base of the fourth med head on the right. X- ray of this area did not show osteomyelitis in relation to a new wound on the tip of his right third toe. Again this probes to bone. Finally his second toe on the left which didn't really show anything on the MRI at least no report appears to have a separated cutaneous area which I think is going to come right off the tip of his toe and leave exposed bone. Whether this is infectious or ischemic I am not  clear Culture I did from the third toe last week which was his new wound was negative. Plain x-ray on the right foot did not show osteomyelitis in the toes 01/24/18; the patient is going went to see Dr. [name]. He is going to have a amputation of the left second and right third toe although paradoxically the left second toe looks better than last week. We have treating a deep probing wound on the left lateral foot and the area over the third metatarsal head on the right foot. 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 Also a debridement including bone of the left lateral foot and a closure which is still sutured. This is a bit surprising. Also apparent debridement of the base of the right third toe wound. 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 I'm not sure exactly where we are in the frame of things here. The wound on the left lateral foot is still sutured also the amputation sites of the left second and right third toe. He is been using Xeroform to the sutured areas silver alginate to the right foot 02/14/18; he continues on Rocephin at dialysis for osteomyelitis. I still don't have a good sense of where we are in the treatment duration. The wounds on the left lateral foot had the sutures removed and this clearly still probes deeply. We debrided the area with a #5 curet. We'll use silver alginate to the wound the area over the right third met head also required debridement of callus skin and subcutaneous tissue and necrotic debris over the wound surface. This tunnels superiorly but I did not  unroofed this today. The areas on the tip of his toes have a crusted surface eschar I did not debridement this either. 02/21/18; he continues on Rocephin at dialysis for  osteomyelitis. I don't have a good sense of where we are and the treatment duration. The wounds on the left lateral foot is callused over on presentation but requires debridement. The area on the right third med head plantar aspect actually is measuring smaller 02/28/18;patient is on Vanco and Fortaz at dialysis, I'm not sure why I thought this was Rocephin above unless it is been changed. I don't have a good sense of time frame here. Using silver collagen to the area on the left lateral foot and right third med head plantar foot 03/07/18; patient is completing bank and Fortaz at dialysis soon. He is using Silver collagen to the area on the left lateral foot and the right third metatarsal head. He has a smaller superficial wound distal to the left lateral foot wound. 03/14/18-he is here in follow-up evaluation for multiple ulcerations to his bilateral feet. He presents with a new ulceration to the plantar aspect of the left foot underneath the blister, this was deroofed to reveal a partial thickness ulcer. He is voicing no complaints or concerns, tolerated dialysis yesterday. We will continue with same treatment plan and he will follow-up next week 03/21/18; the patient has a area on the lateral left foot which still has a small probing area. The overall surface area of the wound is better. He presented with a new ulceration on the left foot plantar fifth metatarsal head last week. The area on the right third metatarsal head appears smaller.using silver alginate to all wound areas. The patient is changing the dressing himself. He is using a Darco forefoot offloading are on the right and a healing sandal on the left 03/28/18; original wound left lateral foot. Still nowhere close to looking like a heeling surface secondary wound on the left lateral foot at the base of his question fifth metatarsal which was new last week Right third metatarsal head. We have been using silver alginate all wounds 04/04/18;  the original wound on the left lateral foot again heavy callus surrounding thick nonviable subcutaneous tissue all requiring debridement. The new wound from 2 weeks ago just near this at the base of the fifth metatarsal head still looks about the same Unfortunately there is been deterioration in the third medical head wound on the right which now probes to bone. I must say this was a superficial wound at one point in time and I really don't have a good frame of reference year. I'll have to go back to look through records about what we know about the right foot. 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 He is also been to see Dr. [name]. Somewhere in here as somebody is ordered a VASCULAR evaluation which was done on 03/22/18. On the right is anterior tibial artery was monophasic triphasic at the posterior tibial artery. On the left anterior tibial artery monophasic posterior tibial artery monophasic. ABI in the right was 1.43 on the left 1.21. TBIs on the right at 0.41 and on the left at 0.32. A vascular consult was recommended and I think has been arranged. 04/11/18; Mr. Beske has not had an MRI of the right foot since 2017. Recent x-ray of the right foot done in January and February was negative. However he has had  a major deterioration in the wound over the third met head. He is completed antibiotics last week at dialysis [place]. I think he is going to see vascular in follow-up. The areas on the left lateral foot and left plantar fifth metatarsal head both look satisfactory. I debrided both of these areas although the tissue here looks good. The area on the left lateral foot once probe to bone it certainly does not do that now 04/18/18; MRI of the right foot is on Thursday. He has what looks to be serosanguineous purulent drainage coming out of the wound over the right third metatarsal head  today. He completed antibiotics bank and Fortaz 2 weeks ago at dialysis. He has a vascular follow-up with regards to his arterial insufficiency although I don't exactly see when that is booked. The areas on the left foot including the lateral left foot and the plantar left fifth metatarsal head look about the same. 04/25/18-He is here in follow-up evaluation for bilateral foot ulcers. MRI obtained was negative for osteomyelitis. Wound culture was negative. We will continue with same treatment plan he'll follow-up next week 05/02/18; the right plantar foot wound over the third metatarsal head actually looks better than when I last saw this. His MRI was negative for osteomyelitis wound culture was negative. On the left plantar foot both wounds on the plantar fifth metatarsal head and on the lateral foot both are covered in a very hard circumferential callus. 05/09/18; right plantar foot wound over the third metatarsal head stable from last week. Left plantar foot wound over the fifth metatarsal head also stable but with callus around both wound areas The area on the left lateral foot had thick callus over a surface and I had some thoughts about leaving this intact however it felt boggy. We've been using silver alginate all wounds 05/16/18; since the patient was last here he was hospitalized from 5/15 through 5/19. 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 This grew MRSA. He was placed on vancomycin.MRI of the left foot suggested "progressive" osteomyelitis and bone destruction of the cuboid and the base of the fifth metatarsal. Stable erosive changes of the base of the second metatarsal. I'm wondering if they're aware that he had surgery and debridement in the area of the underlying bone previously by Dr. [name]. In any case, He was also revascularized with an angioplasty and stenting of the  left tibial peroneal trunk and angioplasty of the left posterior tibial artery. He is on vancomycin at dialysis. He has a 2 week follow-up with infectious disease. He has vascular surgery follow-up. He was started on Plavix. 05/23/18; the patient's wound on the lateral left foot at the level of the fifth metatarsal head and the plantar wound on the plantar fifth metatarsal head both look better. The area on the right third metatarsal head still has depth and undermining. We've been using silver alginate. He is on vancomycin. He has been revascularized on the left 05/30/18; he continues on vancomycin at dialysis. Revascularized on the left. The area on the left lateral foot is just about closed. Unfortunately the area over the plantar fifth metatarsal head undermining superiorly as does the area over the right third metatarsal head. Both of these significantly deteriorated from last week. He has appointments next week with infectious disease and orthopedics I delayed putting on a cast on the left until those appointments  which therefore we made we'll bring him in on Friday the 14th with the idea of a cast on the left foot 06/06/18; he continues on vancomycin at dialysis. Revascularized on the left. He arrives today with a ballotable swelling just above the area on the left lateral foot where his previous wound was. He has no pain but he is reasonably insensate. The difficult areas on the plantar left fifth metatarsal head looks stable whereas the area on the right third plantar metatarsal head about the same as last week there is undermining here although I did not unroofed this today. He required a considerable debridement of the swelling on the left lateral foot area and I unroofed an abscess with a copious amount of brown purulent material which I obtained for culture. I don't believe during his recent hospitalization he had any further imaging although I need to review this. Previous cultures  from this area done in this clinic showed MRSA, I would be surprised if this is not what this is currently even though he is on vancomycin. 06/13/18; he continues on vancomycin at dialysis however he finishes this on Sunday and then graduates the doxycycline previously prescribed by Dr. [name]. The abscess site on the lateral foot that I unroofed last time grew moderate amounts of methicillin-resistant staph aureus that is both vancomycin and tetracycline sensitive so we should be okay from that regard. He arrives today in clinic with a connection between the abscess site and the area on the lateral foot. We've been using silver alginate to all his wound areas. The right third metatarsal head wound is measuring smaller. Using silver alginate on both wound areas 06/20/18; transitioning to doxycycline prescribed by Dr. [name]. The abscess site on the lateral foot that I unroofed 2 weeks ago grew MRSA. That area has largely closed down although the lateral part of his wound on the fifth metatarsal head still probes to bone. I suspect all of this was connected. On the right third metatarsal head smaller looking wound but surrounded by nonviable tissue that once again requires debridement using silver alginate on both areas 06/27/18; on doxycycline 100 twice a day. The abscess on the lateral foot has closed down. Although he still has the wound on the left fifth metatarsal head that extends towards the lateral part of the foot. The most lateral part of this wound probes to bone Right third metatarsal head still a deep probing wound with undermining. Nevertheless I elected not to debridement this this week If everything is copious static next week on the left I'm going to attempt a total contact cast 07/04/18; he is tolerating his doxycycline. The abscess on the left lateral foot is closed down although there is still a deep wound here that probes to bone. We will use silver collagen under the total  contact cast Silver collagen to the deep wound over the right third metatarsal head is well 07/11/18; he is tolerating doxycycline. The left lateral fifth plantar metatarsal head still probes deeply but I could not probe any bone. We've been using silver collagen and this will be the second week under the total contact cast Silver collagen to deep wound over the right third metatarsal head as well 07/18/18; he is still taking doxycycline. The left lateral fifth plantar metatarsal this week probes deeply with a large amount of exposed bone. Quite a deterioration. He required extensive debridement. Specimens of bone for pathology and CNS obtained Also considerable debridement on the right third metatarsal head 07/25/18; bone  for pathology last week from the lateral left foot showed osteomyelitis. CandS of the bone Enterobacter. And Klebsiella. He is now on ciprofloxacin and the doxycycline is stopped [Dr. [name] of infectious disease] area He has an appointment with Dr. [name] tomorrow I'm going to leave the total contact cast off for this week. May wish to try to reapply that next week or the week after depending on the wound bed looks. I'm not sure if there is an operative option here, previously is followed with Dr. [name] 08/01/18 on evaluation today patient presents for reevaluation. He has been seen by infectious disease, Dr. [name], and he has placed the patient back on Ceftaz currently to be given to him at dialysis three times a week. 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 Fortunately there does not appear to be the evidence of infection at this point in time. Overall the patient has been tolerating the dressing changes without complication. We are no longer due lives in the cast of left foot secondary to the infection obviously. 08/10/18 on evaluation today patient appears to be doing rather well all things considered  in regard to his left plantar foot ulcer. He does have a significant callous on the lateral portion of his left foot he wonders if I can help clean that away to some degree today. Fortunately he is having no evidence of infection at this time which is good news. No fevers chills noted. With that being said his right plantar foot actually does have a significant callous buildup around the wound opening this seems to not be doing as well as I would like at this point. I do believe that he would benefit from sharp debridement at this site. Subsequently I think a total contact cast would be helpful for the right foot as well. 08/17/18; the patient arrived today with a total contact cast on the right foot. This actually looks quite good. He had gone to see Dr. Megan Salon of infectious disease about the osteomyelitis on the left foot. I think he is on IV Fortaz at dialysis although I am not exactly sure of the rationale for the Fortaz at the time of this dictation. He also arrived in today with a swelling on the lateral left foot this is the site of his original wounds in fact when I first saw this man when he was under Dr. Ardeen Garland care I think this was the site of where the wound was located. It was not particularly tender however using a small scalpel I opened it to [place] some moderate amount of purulent drainage. 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 Last MRI was in May that showed progressive osteomyelitis and bone destruction of the cuboid and base of the fifth metatarsal. Last week he had an abscess over this same area this was removed. Culture was negative. We are continuing with a total contact cast to the area on the third metatarsal head on the right and making some good progress here. The patient asked me again about renal transplant. He is not on the list because  of the open wounds. 08/31/18; apparently the patient had an interruption in the [place] but he is now back on this at dialysis. Apparently there was a misunderstanding and Dr. Crissie Figures orders. Due to have the MRI of  the left foot tonight. The area on the right foot continues to have callus thick skin and subcutaneous tissue around the wound edge that requires constant debridement however the wound is smaller we are using a total contact cast in this area. Alginate all wounds 09/07/18; without much surprise the MRI of his left foot showed osteomyelitis in the reminiscent of his fourth metatarsal but also the third metatarsal. She arrives in clinic today with the right foot and a total contact cast. There was purulent drainage coming out of the wound which I have cultured. Marked deterioration here with undermining widely around the wound orifice. Using pickups and a scalpel I remove callus and subcutaneous tissue from a substantial new opening. In a similar fashion the area on the left lateral foot that was blistered last week I have opened this and remove skin and subcutaneous tissue from this area to expose a obvious new wound. I think there is extension and communication between all of this on the left foot. The patient is on Fortaz at dialysis. Culture done of the right foot. We are clearly not making progress here. We have made him an appointment with Dr. [name] of orthopedics. I think an amputation of the left leg may be discussed. I don't think there is anything that can be done with foot salvage. 09/14/18; culture from the right foot last time showed a very resistant MRSA. This is resistant to doxycycline. I'm going to try to get linezolid at least for a week. He does not have a appointment with Dr. [name] yet. 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 I'll send a message to Dr. [name] of infectious disease. I want the patient to see  Dr. [name] to go over the pros and cons of an amputation which I think will be a BKA. The patient had a question about whether this is curative or not. I told him that I thought it would be although spread of staph aureus infection is not unheard of. MRI of the right foot was done at the end of April 2019 did not show osteomyelitis. The patient last saw Dr. [name] on 9/3. I'll send Dr. [name] a message. I would really like him to weigh the pros and cons of a BKA on the left otherwise he'll probably need IV antibiotics and perhaps hyperbaric oxygen again. He has not had a good response to this in the past. His MRI earlier this month showed progressive damage in the remnants of the fourth and third metatarsal 09/21/2018; sees Dr. [name] of orthopedics next week to discuss a left BKA in response to the osteomyelitis in the left foot. Using silver alginate to all wound areas. He is completing the Zyvox I did put in for him after last culture showed MRSA 09/29/2018; patient saw Dr. [name] of orthopedics discussed the osteomyelitis in the left foot third and fourth metatarsals. He did not recommend urgent surgery but certainly stated the only surgical option would be a BKA. He is communicating with Dr. [name]. The problem here is the instability of the areas on his foot which constantly generate draining abscesses. I will communicate with Dr. [name] about this. He has an appointment on 10/23 10/05/2018; patient sees Dr. [name] on 10/23. I have sent him a secure message not ordered any additional antibiotics for now. Patient continues to have 2 open areas on the left plantar foot at the fifth metatarsal head and the lateral aspect  of the foot. These have not changed all that much. The area on the right third met head still has thick callus and surrounding subcutaneous tissue we have been using silver alginate 10/12/2018; patient sees Dr. [name] or colleague on 10/23. 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 The area on the right plantar foot is just about closed. The superior wound on the left fifth metatarsal head is callused over but I am not sure if this is closed. The area below it is about the same. Considerable amount of callus on the lateral foot. We have been using silver alginate to the wounds 10/19/2018; the patient saw Dr. [name] on 10/22. He wishes to try and continue to save the left foot. He has been given 6 weeks of oral ciprofloxacin. We continue to put a total contact cast on the right foot. The area over the fifth metatarsal head on the left is callused over/perhaps healed but I did not remove the callus to find out. He still has the wound on the left lateral midfoot requiring debridement. We have been using silver alginate to all wound areas 10/26/2018 On the left foot our intake nurse noted some purulent drainage from the inferior wound [currently the only one that is not callused over] On the right foot even though he is in total contact cast a considerable amount of thick black callus and surface eschar. On this side we have been using silver alginate under a total contact cast. he remains on ciprofloxacin as prescribed by Dr. [name] 11/02/2018; culture from last week which is done of the probing area on the left midfoot wound showed MRSA "few". I am going to need to contact Dr. [name] which I will do today I think he is going to need IV antibiotics again. The area on the left foot which was callused on the side is clearly separating today all of this was removed a copious amounts of callus and necrotic subcutaneous tissue. The area on the right plantar foot actually remained healthy looking with a healthy granulated base. We are using silver alginate to all wound areas 11/09/2018; unfortunately neither 1 of the patient's wounds areas looks at all satisfactory. On the left he has considerable  necrotic debris from the plantar wound laterally over the foot. I removed copious amounts of material including callus skin and subcutaneous tissue. On the right foot he arrives with undermining and frankly purulent drainage. Specimen obtained for culture and debridement of the callused skin and subcutaneous tissue from around the circumference. Because of this I cannot put him back in a total contact cast but to be truthful we are really unfortunately not making a lot of progress I did put in a secure text message to Dr. [name] wondering about the MRSA on the left foot. He suggested vancomycin although this is not started. I was left wondering if he expected me to send this into dialysis. Has we have more purulence on the right side wait for that result before calling dialysis 11/16/2018; I called dialysis earlier this week to get the vancomycin started. I think he got the first dose on Tuesday. The patient tells me that he fell earlier this week twisting his left foot and ankle and he is swelling. He continues to not look well. He had blood cultures done at dialysis on Tuesday he is not been informed of the results. 12/07/2018; the patient was admitted to hospital from 11/22/2018 through  12/02/2018. He underwent a left BKA. Apparently had staph sepsis. In the meantime being off the right foot this is closed over. He follows up with Dr. 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 Subsequent to this he actually had multiple falls in fact he tells me to following which calls the wound to be his apparently. He has been seeing Dr. [name] and his physician assistant in the interim. They actually did want him to come back for reevaluation to see if there's anything we can do for me wound care perspective the help this area to heal more appropriately. The patient states that he does not have  a tremendous amount of pain which is good news. No fevers, chills, nausea, or vomiting noted at this time. We have gotten approval from Dr. [name] office had [place] for [place] to treat the patient for his stop wound. This is due to the fact that the patient was in the 90 day postop global. 1/21; the patient does not have an open wound on the right foot although he does have some pressure areas that will need to be padded when he is transferring. The dehisced surgical wound looks clean although there is some undermining of 1.5 cm superiorly. 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 This appears healthy but there is undermining. TheraSkin #1 2/11; TheraSkin #2 2/25; TheraSkin #3. Wound is measuring smaller 3/10; TheraSkin #4 wound is measuring smaller 3/24; TheraSkin #5 wound is measuring smaller and looks healthy. 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 He has no open area on the right foot although he does have some callused area over the original third metatarsal head wound. The third metatarsal is also subluxed on the right foot. I have warned him today that he cannot consider wearing a prosthesis for at least a month but he can go for measurements. He is going to need to keep the right foot padded is much as possible in his diabetic shoe indefinitely READMISSION 06/12/2019 Mr. Wass is a type II diabetic on dialysis. He has been in this clinic multiple times with wounds on his bilateral lower extremities. 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 He also has a history of wounds on his right foot but tells me that everything is going well here. He actually came in here with his prosthesis walking with a cane. We were all really quite gratified to see this He states over the  last several weeks he has had a painful area at the tip of the left third finger. His dialysis shunt in his is in the left upper arm and this particularly hurts during dialysis when his fingers get numb and there is a lot of pain in the tip of his left third finger. I believe he is seen vascular surgery. He had a Doppler done to evaluate for a dialysis steal syndrome. He is going for a banding procedure 1 week from today towards the left AV fistula. I think if that is unsuccessful they will be looking at creating a new shunt. He had a course of doxycycline when he which he finished about a week ago. He has been using Bactroban to  the finger 6/25; the patient had his banding procedure and things seem to be going better. He is not having pain in his hand at dialysis. The area on the tip of his finger seems about closed. He did however have a paronychia I the last time I saw him. I have been advising him to use topical Bactroban washing the finger. Culture I did last time showed a few staph epidermidis which I was willing to dismiss as superficial skin contaminant. He arrives today with the finger wound looking better however the paronychia a seems worse 7/9; 2-week follow-up. He does not have an open wound on the tip of his third left finger. I thought he had an initial ischemic wound on the tip of the finger as well as a paronychia a medially. All of this appears to be closed. 8/6-Patient returns after 3 weeks for follow-up and has a right second met head plantar callus with a significant area of maceration hiding an ulcer ABI repeated today on right - 1.26 8/13-Patient returns at 1 week, the right second met head plantar wound appears to be slightly better, it is surrounded by the callus area we are using silver alginate 8/20; the patient came back to clinic 2 weeks ago with a wound on the right second metatarsal head. He apparently told me he developed callus in this area but also went away from  his custom shoes to a pair of running shoes. Using silver alginate. 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 There might be much we can do to offload this although the patient tells me he has a wheelchair that he is using to try and stay off the wound is much as possible 8/27; right second metatarsal head. Still small punched out area with thick callus and subcutaneous tissue around the wound. We have been using silver collagen. This is always been an issue with this man's wound especially on this foot 9/3; right second met head. Still the same punched-out area with thick callus and subcutaneous tissue around the wound removing the circumference demonstrates repetitively undermining area. I have removed all the subcutaneous tissue associated with this. We have been using silver collagen 9/15; right second metatarsal head. Same punched-out thick callus and subcutaneous tissue around the wound area. Still requiring debridement we have been using silver collagen I changed back to silver alginate X-ray last time showed no evidence of osteomyelitis. Culture showed Klebsiella and he was started and Keflex apparently just 4 days ago 9/24; right second metatarsal head. He is completed the antibiotics I gave him for 10 days. Same punched-out thick callus and subcutaneous tissue around the area. Still requiring debridement. We have been using silver alginate. The area itself looks swollen and this could be just [place] that comes on this area with walking on a prosthesis on the left however I think he needs an MRI and I am going to order that today. There is not an option to offload this further 10/1; right second plantar metatarsal head. MRI booked for October 6. Generally looking better today using silver alginate 10/8; right second plantar metatarsal head. MRI that was done on 10/6 was negative for osteomyelitis noted prior amputations of the second and fourth toes at the [facility].  Prior amputation of the third toe to the base of the middle phalanx. Prior amputation of the fifth toe to the head of the metatarsal. They also noted a partially non-united stress fracture at the base of the  third metatarsal. He does not recall about hearing about this. He did not have any pain but then again he has reduced sensation from diabetic neuropathy 10/15; right second plantar metatarsal head. Still in the same condition callus nonviable subcutaneous tissue which cleans up nicely with debridement but reforms by the next week. The patient tells me that he is offloading this is much as he can including taking his wheelchair to dialysis. We have been using silver alginate, changed to silver collagen today 10/22; right second plantar metatarsal head. Wound measures smaller but still thick callus around this wound. Still requiring debridement 11/5 right second plantar metatarsal head. Less callus around the wound circumference but still requiring debridement. There is still some undermining which I cleaned out the wound looks healthy but still considerable punched out depth with a relatively small circumference to the wound there is no palpable bone no purulent drainage 11/12; right second plantar metatarsal head. Small wound with thick callus tissue around the wound and significant undermining. We have been using silver collagen after my continuous debridements of this area 11/19; patient arrives in today with purulent drainage coming out of the wound in the second third met head area on the right foot. Serosanguineous thick drainage. Specimen obtained for culture. 11/21/2019 on evaluation today patient appears to be doing well with regard to his foot ulcer. The good news is is culture came back negative for any bacteria there was no growth. The other good news is his x-ray also appears to be doing well. The foot is also measuring much better than what it was. Overall I am very pleased with  how things seem to be progressing. 12/22; patient was in [facility] for several weeks due to the death of a family member. He said he stayed off the wound using silver alginate. 01/03/2020. Patient has a small open area with roughly 0.3 cm of circumferential undermining. The orifice looks smaller. We have been using silver collagen. There is not a wound more aggressive way to offload this area as he has a prosthesis on the other leg 1/21; again the same small area is 2 weeks ago. We have been using silver collagen I change that to endoform today I really believe that this is an offloading issue 2/4; the area on the right third met head. We have been using endoform. 2/11 the area on the right third met head we have been using silver alginate. 2/25; the area on the right third met head. There is much less callus on this today. The patient states he is offloading this even more aggressively. As an example he is taking his wheelchair into dialysis on most days 3/4; right third metatarsal head. Again he has eschar over the surface of the wound which I removed with a #3 curette. Some subcutaneous debris. This has 0.8 cm of direct probing depth. I cannot feel any bone here. I did do a culture the area Electronic Signature(s) Signed: 02/29/2020 5:21:33 PM By: Linton Ham MD Signed: 03/03/2020 5:59:01 PM By: Levan Hurst RN, BSN Previous Signature: 02/28/2020 5:41:03 PM Version By: Linton Ham MD Entered By: Levan Hurst on 02/29/2020 08:57:18 -------------------------------------------------------------------------------- Physical Exam Details Patient Name: Date of Service: REGINALD, WEIDA 02/28/2020 12:30 PM Medical Record [id number] Patient Account Number: [id number] Date of Birth/Sex: Treating RN: 1951/07/28 (69 y.o. [name] Primary Care Provider: [name] Other Clinician: Referring Provider: Treating Provider/Extender:Harlene Petralia, Jeronimo Greaves, Buena Irish in  Treatment: 64 Constitutional Patient is hypotensive. However he appears well this  is not unusual for him. Pulse regular and within target range for patient.Marland Kitchen Respirations regular, non-labored and within target range.. Temperature is normal and within the target range for the patient.Marland Kitchen Appears in no distress. Notes Wound exam; tunneling wound. Callus around this over it removed with a #3 curette there is still 0.7 cm of probing depth this is worse than I measured last week at 0.4 cm. Electronic Signature(s) Signed: 02/28/2020 5:41:03 PM By: Linton Ham MD Entered By: Linton Ham on 02/28/2020 14:07:04 -------------------------------------------------------------------------------- Physician Orders Details Patient Name: Date of Service: OSEIAS, HORSEY 02/28/2020 12:30 PM Medical Record [id number] Patient Account Number: [id number] Date of Birth/Sex: Treating RN: 08-17-1951 (69 y.o. 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 - Thursday Dressing Change Frequency Wound #41 Right Metatarsal head second Change dressing every day. Wound Cleansing May shower and wash wound with soap and water. Primary Wound Dressing Wound #41 Right Metatarsal head second Skin Substitute Application - run insurance for approval for oasis. Other: - Iodoform packing strips. pack into wound. Secondary Dressing Wound #41 Right Metatarsal head second Foam - foam donut Kerlix/Rolled  Gauze Dry Gauze Other: - felt to periwound and place one below wound. lightly wrap coban to hold dressing in place. felt / pad wound Edema Control Avoid standing for long periods of time Elevate legs to the level of the heart or above for 30 minutes daily and/or when sitting, a frequency of: - throughout the day. Off-Loading Other: - felt to periwound and place one below wound. Additional Orders / Instructions Other: - minimize walking and standing on foot to aid in wound healing and callus buildup. Laboratory Bacteria identified in Unspecified specimen by Anaerobe culture (MICRO) - Culture of right foot wound. - (ICD10 E11.621 - Type 2 diabetes mellitus with foot ulcer) LOINC Code: 478-2 Convenience Name: Anerobic culture Electronic Signature(s) Signed: 02/28/2020 5:41:03 PM By: Linton Ham MD Signed: 02/28/2020 6:05:23 PM By: Deon Pilling Entered By: Deon Pilling on 02/28/2020 13:47:50 -------------------------------------------------------------------------------- Problem List Details Patient Name: Date of Service: ISHAQ, MAFFEI 02/28/2020 12:30 PM Medical Record [id number] Patient Account Number: [id number] Date of Birth/Sex: Treating RN: 1951/12/19 (69 y.o. 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, 06/12/2019 No Yes other extremity L03.115 Cellulitis of right lower limb 11/15/2019 No Yes Inactive Problems ICD-10 Code Description Active Date Inactive Date L03.114 Cellulitis of left upper limb 06/12/2019  06/12/2019 L98.498 Non-pressure chronic ulcer of skin of other sites with other 06/12/2019 06/12/2019 specified severity S61.209D Unspecified open wound of unspecified finger without damage 06/12/2019 06/12/2019 to nail, subsequent encounter Resolved Problems Electronic Signature(s) Signed: 02/28/2020 5:41:03 PM By: Linton Ham MD Entered By: Linton Ham on 02/28/2020 14:04:48 -------------------------------------------------------------------------------- Progress Note Details Patient Name: Date of Service: Hardie Pulley B. 02/28/2020 12:30  PM Medical Record [id number] Patient Account Number: [id number] Date of Birth/Sex: Treating RN: 06-04-1951 (69 y.o. [name] Primary Care Provider: [name] Other Clinician: Referring Provider: Treating Provider/Extender:Renner Sebald, Jeronimo Greaves, Buena Irish in Treatment: 37 Subjective History of Present Illness (HPI) The following HPI elements were documented for the patient's wound: Location: Patient presents with a wound to bilateral feet. Quality: Patient reports experiencing essentially no pain. Severity: Mildly severe wound with no evidence of infection Duration: Patient has had the wound for greater than 2 weeks prior to presenting for treatment The patient is a pleasant 69 yrs old bm here for evaluation of ulcers on the plantar aspect of both feet. 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 He has a left arm graft for access. He has been trying to stay off his feet for weeks but does not seem to have any improvement in the wound. He has been seeing someone at the foot center and was referred to the Wound Care center for further evaluation. 12/30/15 the patient has 3 wounds one over the right first metatarsal head and 2 on the left foot at the left fifth and lleft first metatarsal head. All of these look relatively similar. The one over the left fifth probe to bone I  could not prove that any of the others did. I note his MRI in November that did not show osteomyelitis. His peripheral pulses seem robust. All of these underwent surgical debridement to remove callus nonviable skin and subcutaneous tissue 01/06/16; the patient had his sutures removed from the right fifth ray amputation. There may be a small open part of this superiorly but otherwise the incision looks good. Areas over his right first, left first and left fifth metatarsal head all underwent surgical debridement as a varying degree of callus, skin and nonviable subcutaneous tissue. The area that is most worrisome is the right fifth metatarsal head which has a wound probes precariously close to bone. There is no purulent drainage or erythema 01/13/16; I'm not exactly sure of the status of the right fifth ray amputation site however he follows with Dr. [name] later this week. The area over his right first plantar metatarsal head, left first and fifth plantar metatarsal head are all in the same status. Thick circumferential callus, nonviable subcutaneous tissue. Culture of the left fifth did not culture last week 01/20/16; all of the patient's wounds appear and roughly the same state although his amputation site on the right lateral foot looks better. His wounds over the right first, left first and left fifth metatarsal heads all underwent difficult surgical debridement removing circumferential callus nonviable skin and subcutaneous tissue. There is no overt evidence of infection in these areas. MRI at the end of December of the left foot did not show osteomyelitis, right foot showed osteomyelitis of the right fifth digit he is now status post amputation. 01/27/16 the patient's wounds over his plantar first and fifth metatarsal heads on the left all appear better having been started on a total contact cast last week. They were debridement of circumferential callus and nonviable subcutaneous tissue as was  the wound over the first metatarsal head. His surgical incision on the right also had some light surface debridement done. 03/23/2016 -- the patient was doing really well and most of his wounds had almost completely healed but now he came back today with a history of having a discharge from the area of his right foot on the plantar  aspect and also between his first and second toe. He also has had some discharge from the left foot. Addendum: I spoke to the PA [name] at the dialysis center whose fax number is [phone]. We discussed the infection the patient has and she will put the patient on vancomycin and Fortaz until the final culture report is back. We will fax this as soon as available. 03/30/2016 -- left foot x-ray IMPRESSION:No definitive osteomyelitis noted. X-ray of the right foot -- IMPRESSION: 1. Soft tissue swelling. Prior amputation right fifth digit. No acute or focal bony abnormality identified. If osteomyelitis remains a clinical concern, MRI can be obtained. 2. Peripheral vascular disease. His culture reports have grown an MSSA -- and we will fax this report to his hemodialysis center. He was called in a prescription of oral doxycycline but I have told her not to fill this in as he is already on IV antibiotics. 04/06/2016 -- a few days ago, I spoke to the hemodialysis center nurse who had stopped the IV antibiotics and he was given a prescription for doxycycline 100 mg by mouth twice a day for a week and he is on this at the present time. 05/11/2016 -- he has recently seen his PCP this week and his hemoglobin A1c was 7. He is working on his paperwork to get his orthotic shoes. 05/25/2016 -- -- x-ray of the left foot IMPRESSION:No acute bony abnormality. No radiographic changes of acute osteomyelitis. No change since prior study. X-ray of the right foot -- IMPRESSION: Postsurgical changes are seen involving the fifth toe. No evidence of acute osteomyelitis. he  developed a large blister on the medial part of his right foot and this opened out and drain fluid. 06/01/2016 -- he has his MRI to be done this afternoon. 06/15/2016 -- MRI of the left forefoot without contrast shows 2 separate regions of cutaneous and subcutaneous edema and possible ulceration and blistering along the ball of the foot. No obvious osteomyelitis identified. MRI of the right foot showed cutaneous and subcutaneous thickening plantar to the first digit sesamoid with an ulcer crater but no underlying osteomyelitis is identified. 06/22/2016 -- the right foot plantar ulcer has been draining a lot of seropurulent material for the last few days. 06/30/2016 -- spoke to the dialysis center and I believe I spoke to [place] a PA at the center who discussed with me and agreed to putting Legrand Como on vancomycin until his cultures arrive. On review of his culture report no WBCs were seen or no organisms were seen and the culture was reincubated for better growth. The final report is back and there were no predominant growth including Streptococcus or Staphylococcus. Clinically though he has a lot of drainage from both wounds a lot of undermining and there is further blebs on the left foot towards the interspace between his first and second toe. 08/10/2016 -- the culture from the right foot showed normal skin flora and there was no Staphylococcus aureus was group A streptococcus isolated. 09/21/2016 -- -- MRI of the right foot was done on 09/13/2016 - IMPRESSION: Findings most consistent with acute osteomyelitis throughout the great toe, sesamoid bones and plantar aspect of the head of the first metatarsal. Fluid in the sheath of the flexor tendon of the great toe could be sympathetic but is worrisome for septic tenosynovitis. First MTP joint effusion worrisome for septic joint. He was admitted to the hospital on 09/11/2016 and treated for a fever with vancomycin and cefepime. He was seen by  Dr.  Bobby Rumpf of infectious disease who recommended 6 weeks treatment with vancomycin and ceftazidime with his hemodialysis and to continue to see as in the wound clinic. Vascular consult was pending. The patient was discharged home on 09/14/2016 and he would continue with IV antibiotics for 6 weeks. 09/28/16 wound appears reasonably healthy. Continuing with total contact cast 10/05/16 wound is smaller and looking healthy. Continue with total contact cast. He continues on IV antibiotics 10/12/2016 -- he has developed a new wound on the dorsal aspect of his left big toe and this is a superficial injury with no surrounding cellulitis. He has completed 30 days of IV antibiotics and is now ready to start his hyperbaric oxygen therapy as per his insurance company's recommendation. 10/19/2016 -- after the cast was removed on the right side he has got good resolution of his ulceration on the right plantar foot. he has a new wound on the left plantar foot in the region of his fourth metatarsal and this will need sharp debridement. 10/26/2016 -- Xray of the right foot complete: IMPRESSION: Changes consistent with osteomyelitis involving the head of the first metatarsal and base of the first proximal phalanx. The sesamoid bones are also likely involved given their positioning. 11/02/2016 -- there still awaiting insurance clearance for his hyperbaric oxygen therapy and hopefully he will begin treatment soon. 11/09/2016 -- started with hyperbaric oxygen therapy and had some barotrauma to the right ear and this was seen by ENT who was prescribed Afrin drops and would probably continue with HBO and he is scheduled for myringotomy tubes on Friday 11/16/2016 -- he had his myringotomy tubes placed on Friday and has been doing much better after that with some fluid draining out after hyperbaric treatment today. The pain was much better. 11/30/2016 -- over the last 2 days he noticed a swelling and change of  color of his right second toe and this had been draining minimal fluid. 12/07/2016 -- x-ray of the right foot -- IMPRESSION:1. Progressive ulceration at the distal aspect of the second digit with significant soft tissue swelling and osseous changes in the distal phalanx compatible with osteomyelitis. 2. Chronic osteomyelitis at the first MTP joint. 3. Ulcerations at the second and third toes as well without definite osseous changes. Osteomyelitis is not excluded. 4. Fifth digit amputation. On 12/01/2016 oo I spoke to Dr. Bobby Rumpf, the infectious disease specialist, who kindly agreed to treat this with IV antibiotics and he would call in the order to the dialysis center and this has been discussed in detail with the patient who will make the appropriate arrangements. The patient will also book an appointment as soon as possible to see Dr. [name] in the office. Of note the patient has not been on antibiotics this entire week as the dialysis center did not receive any orders from Dr. [name]. I got in touch with Dr. [name] who tells me the patient has an appointment to see him this coming Wednesday. 12/14/2016 -- the patient is on ceftazidime and vancomycin during his dialysis, and I understand this was put on by his nephrologist Dr. Raliegh Ip possibly after speaking with infectious disease Dr. [name] 12/23/16; patient was on my schedule today for a wound evaluation as he had difficulties with his schedule earlier this week. I note that he is on vancomycin and ceftazidine at dialysis. In spite of this he arrives today with a new wound on the base of the right second toe this easily probes to bone. The known wound at  the tip of the second toe With this area. He also has a superficial area on the medial aspect of the third toe on the side of the DIP. This does not appear to have much depth. The area on the plantar left foot is a deep area but did not probe the bone 12/28/16 -- he has brought in some  lab work and the most recent labs done showed a hemoglobin of 12.1 hematocrit of 36.3, neutrophils of 51% WBC count of 5.6, BN of 53, albumin of 4.1 globulin of 3.7, vancomycin 13 g per mL. 01/04/2017 -- he saw Dr. [name] who has recommended a amputation of the right second toe and he is awaiting this date. He sees Dr. Bobby Rumpf of infectious disease tomorrow. The bone culture taken on 12/28/2016 had no growth in 2 days. 01/11/2017 -- was seen by Dr. Bobby Rumpf regarding the management and has recommended a eval by vascular surgeons. He recommended to continue the antibiotics during his hemodialysis. Xray of the left foot -- IMPRESSION: No acute fracture, dislocation, or osseous erosion identified. 01/18/2017 -- he has his vascular workup later today and he is going to have his right foot second toe amputation this coming Friday by Dr. [name]. We have also put in for a another 30 treatments with hyperbaric oxygen therapy 01/25/2017 -- I have reviewed Dr. Donnetta Hutching is vascular report from last week where he reviewed him and thought his vascular function was good enough to heal his amputation site and no further tests were recommended. He also had his orthopedic related to surgery which is still pending the notes and we will review these next week. 02/01/2017 -- the operative remote of Dr. 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 02/08/2017 --he was seen by Dr. [name] on 02/03/2017 -- patient is on [place]. after a thorough review he had recommended to continue with antibiotics during hemodialysis. The patient was seen by Dr.  [name], but I do not find any follow-up note on the electronic medical record. he has removed the dressing over the right foot to remove the sutures and asked him to see him back in a month's time 02/22/2017 -- patient has been febrile and has been having symptoms of the upper respiratory tract infection but has not been checked for the flu and it's been over 4 days now. He says he is feeling better today. Some drainage between his left first and second toe and this needed to be looked at 03/08/2017 -- was seen by Dr. Bobby Rumpf on 03/07/2017 after review he will stop his antibiotics and see him back in a month to see if he is feeling well. 03/15/2017 -- he has completed his course of hyperbaric oxygen therapy and is doing fine with his health otherwise. 03/22/2017 -- his nutritionist at the dialysis center has recommended a protein supplement to help build his collagen and we will prescribe this for him when he has the details 05/03/2017 -- he recently noticed the area on the plantar aspect of his fifth metatarsal head which opened out and had minimal drainage. 05/10/2017 -- -- x-ray of the left foot -- IMPRESSION:1. No convincing conventional radiographic evidence of active  osteomyelitis.2. Active soft tissue ulceration at the tip of the fifth digit, and at the lateral aspect of the foot adjacent the base of the fifth metatarsal. 3. Surgical changes of prior fourth toe amputation. 4. 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 5. Small vessel atherosclerotic vascular calcifications. 6. 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 ======= Readmission after 5 weeks: 06/15/2017 -- the patient returns after 5 weeks having had an MRI done on 05/17/2017 MRI of the left foot without contrast showed soft tissue ulcer overlying the base of the fifth metatarsal. Osteomyelitis of the base of the fifth metatarsal  along the lateral margin. No drainable fluid collection to suggest an abscess. He was in hospital between 05/16/2017 and 05/25/2017 -- and on discharge was asked to follow-up with Dr. [name] and Dr. [name]. He was treated 2 weeks post discharge with vancomycin plus ceftriaxone with hemodialysis and oral metronidazole 500 mg 3 times a day. 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 last hemoglobin A1c was 6.7% He had a postoperative MR of the foot on 05/23/2017 -- which showed no new areas of cortical bone loss, edema or soft tissue ulceration to suggest osteomyelitis. the patient has completed his course of IV antibiotics during dialysis and is to see the infectious disease doctor tomorrow. Dr. [name] had seen him after suture removal and asked him to keep the wound with a dry dressing 06/21/2017 -- he was seen by Dr. Bobby Rumpf of infectious disease on 06/16/2017 -- he stopped his Flagyl and will see him back in 6 weeks. He was asked to follow-up with Dr. [name] and with the wound clinic clinic. 07/05/17; bone biopsy from last time showed acute osteomyelitis. This is from the reminiscent in the left fifth metatarsal. Culture result is apparently still pending [holding for anaerobe}. From my understanding in this case this is been a progressive necrotic wound which is deteriorated markedly over the last 3 weeks since he returned here. He now has a large area of exposed bone which was biopsied and cultured last week. Dr. Graylon Good has put him on vancomycin and Fortaz during his hemodialysis and Flagyl orally. He is to see Dr. [name] next week 07/19/2017 -- the patient was reviewed by Dr. [name] of orthopedics who reviewed the case in detail and agreed with the plan to continue with IV antibiotics, aggressive wound care and hyperbaric oxygen therapy. He would see him back in 3 weeks' time 08/09/2017 -- saw Dr.  Bobby Rumpf on 08/03/2017 -he was restarted on his antibiotics for 6 weeks which included vanco, ceftaz and Flagyl. he recommended continuing his antibiotics for 6 weeks and reevaluate his completion date. He would continue with wound care and hyperbaric oxygen therapy. 08/16/2017 -- I understand he will be completing his 6 weeks of antibiotics sometime later this week. 09/19/2017 -- he has been without his wound VAC for the last week due to lack of supplies. He has been packing his wound with silver alginate. 10/04/2017 -- he has an appointment with Dr. [name] tomorrow and hence we will not apply the wound VAC after his dressing changes today. 10/11/2017 -- he was seen by Dr. Bobby Rumpf on 10/05/2017, noted that the patient was currently off antibiotics, and after thorough review he recommended he follow-up with the wound care as per plan and no further antibiotics at this point. 11/29/17; patient is  arrived for the wound on his left lateral foot.Marland Kitchen He is completed IV antibiotics and 2 rounds of hyperbaric oxygen for treatment of underlying osteomyelitis. He arrives today with a surface on most of the wound area however our intake nurse noted drainage from the superior aspect. This was brought to my attention. 12/13/2017 -- the right plantar foot had a large bleb and once the bleb was opened out a large callus and subcutaneous debris was removed and he has a plantar ulcer near the fourth metatarsal head 12/28/17 on evaluation today patient appears to be doing acutely worse in regard to his left foot. The wound which has been appearing to do better as now open up more deeply there is bone palpable at the base of the wound unfortunately. He tells me that this feels "like it did when he had osteomyelitis previously" he also noted that his second toe on the left foot appears to be doing worse and is swollen there does appear to be some fluid collected underneath. His right foot plantar ulcer  appears to be doing somewhat better at this point and there really is no complication at the site currently. No fevers, chills, nausea, or vomiting noted at this time. Patient states that he normally has no pain at this site however. Today he is having significant pain. 01/03/18; culture done last week showed methicillin sensitive staph aureus and group B strep. He was on [place] however he arrives today with a fever of 101. He has a functional dialysis shunt in his left arm but has had no pain here. No cough. He still makes some urine no dysuria he did have abdominal pain nausea and diarrhea over the weekend but is not had any diarrhea since yesterday. Otherwise he has no specific complaints 01/05/18; the patient returns today in follow-up for his presentation from 1/8. At that point he was febrile. I gave him some Levaquin adjusted for his dialysis status. He tells me the fever broke that night and it is not likely that this was actually a wound infection. He is gone on to have an MRI of the left foot; this showed interval development of an abnormal signal from the base of the third and fourth metatarsal and in the cuboid reminiscent consistent with osteomyelitis. There is no mention of the left second toe I think which was a concern when it was ordered. The patient is taking his Levaquin 01/10/18; the patient has completed his antibiotics today. The area on the left lateral foot is smaller but it still probes easily to bone. He has underlying osteomyelitis here and sees Dr. Megan Salon of infectious disease this week ooThe area on the right third plantar metatarsal head still requires debridement not much change in dimensions [facility] has a new wound on the medial tip of his right third toe again this probes to bone. He only noticed this 2 days ago 01/17/18; the patient saw Dr. [name] last week and he is back on Vanco and Fortaz at dialysis. This is related to the osteomyelitis in the base of his left  lateral foot which I think is the bases of his third and fourth metatarsals and cuboid remnant. We are previously seeing him for a wound also on the base of the fourth med head on the right. X- ray of this area did not show osteomyelitis in relation to a new wound on the tip of his right third toe. Again this probes to bone. Finally his second toe on the left which didn't  really show anything on the MRI at least no report appears to have a separated cutaneous area which I think is going to come right off the tip of his toe and leave exposed bone. Whether this is infectious or ischemic I am not clear Culture I did from the third toe last week which was his new wound was negative. Plain x-ray on the right foot did not show osteomyelitis in the toes 01/24/18; the patient is going went to see Dr. [name]. He is going to have a amputation of the left second and right third toe although paradoxically the left second toe looks better than last week. We have treating a deep probing wound on the left lateral foot and the area over the third metatarsal head on the right foot. 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 Also a debridement including bone of the left lateral foot and a closure which is still sutured. This is a bit surprising. Also apparent debridement of the base of the right third toe wound. 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 I'm not sure exactly where we are in the frame of things here. The wound on the left lateral foot is still sutured also the amputation sites of the left second and right third toe. He is been using Xeroform to the sutured areas silver alginate to the right foot 02/14/18; he continues on Rocephin at dialysis for osteomyelitis. I still don't  have a good sense of where we are in the treatment duration. The wounds on the left lateral foot had the sutures removed and this clearly still probes deeply. We debrided the area with a #5 curet. We'll use silver alginate to the wound oothe area over the right third met head also required debridement of callus skin and subcutaneous tissue and necrotic debris over the wound surface. This tunnels superiorly but I did not unroofed this today. ooThe areas on the tip of his toes have a crusted surface eschar I did not debridement this either. 02/21/18; he continues on Rocephin at dialysis for osteomyelitis. I don't have a good sense of where we are and the treatment duration. The wounds on the left lateral foot is callused over on presentation but requires debridement. The area on the right third med head plantar aspect actually is measuring smaller 02/28/18;patient is on Vanco and Fortaz at dialysis, I'm not sure why I thought this was Rocephin above unless it is been changed. I don't have a good sense of time frame here. Using silver collagen to the area on the left lateral foot and right third med head plantar foot 03/07/18; patient is completing bank and Fortaz at dialysis soon. He is using Silver collagen to the area on the left lateral foot and the right third metatarsal head. He has a smaller superficial wound distal to the left lateral foot wound. 03/14/18-he is here in follow-up evaluation for multiple ulcerations to his bilateral feet. He presents with a new ulceration to the plantar aspect of the left foot underneath the blister, this was deroofed to reveal a partial thickness ulcer. He is voicing no complaints or concerns, tolerated dialysis yesterday. We will continue with same treatment plan and he will follow-up next week 03/21/18; the patient has a area on the lateral  left foot which still has a small probing area. The overall surface area of the wound is better. He presented with a new  ulceration on the left foot plantar fifth metatarsal head last week. The area on the right third metatarsal head appears smaller.using silver alginate to all wound areas. The patient is changing the dressing himself. He is using a [place] forefoot offloading are on the right and a healing sandal on the left 03/28/18; original wound left lateral foot. Still nowhere close to looking like a heeling surface oosecondary wound on the left lateral foot at the base of his question fifth metatarsal which was new last week ooRight third metatarsal head. ooWe have been using silver alginate all wounds 04/04/18; the original wound on the left lateral foot again heavy callus surrounding thick nonviable subcutaneous tissue all requiring debridement. The new wound from 2 weeks ago just near this at the base of the fifth metatarsal head still looks about the same ooUnfortunately there is been deterioration in the third medical head wound on the right which now probes to bone. I must say this was a superficial wound at one point in time and I really don't have a good frame of reference year. I'll have to go back to look through records about what we know about the right foot. 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 He is also been to see Dr. [name]. Somewhere in here as somebody is ordered a VASCULAR evaluation which was done on 03/22/18. On the right is anterior tibial artery was monophasic triphasic at the posterior tibial artery. On the left anterior tibial artery monophasic posterior tibial artery monophasic. ABI in the right was 1.43 on the left 1.21. TBIs on the right at 0.41 and on the left at 0.32. A vascular consult was recommended and I think has been arranged. 04/11/18; Mr. Palazzi has not had an MRI of the right foot since 2017. Recent x-ray of the right foot done in January and February was negative.  However he has had a major deterioration in the wound over the third met head. He is completed antibiotics last week at dialysis [place]. I think he is going to see vascular in follow-up. The areas on the left lateral foot and left plantar fifth metatarsal head both look satisfactory. I debrided both of these areas although the tissue here looks good. The area on the left lateral foot once probe to bone it certainly does not do that now 04/18/18; MRI of the right foot is on Thursday. He has what looks to be serosanguineous purulent drainage coming out of the wound over the right third metatarsal head today. He completed antibiotics bank and Fortaz 2 weeks ago at dialysis. He has a vascular follow-up with regards to his arterial insufficiency although I don't exactly see when that is booked. The areas on the left foot including the lateral left foot and the plantar left fifth metatarsal head look about the same. 04/25/18-He is here in follow-up evaluation for bilateral foot ulcers. MRI obtained was negative for osteomyelitis. Wound culture was negative. We will continue with same treatment plan he'll follow-up next week 05/02/18; the right plantar foot wound over the third metatarsal head actually looks better than when I last saw this. His MRI was negative for osteomyelitis wound culture was negative. ooOn the left plantar foot both wounds on the plantar fifth metatarsal head and on the  lateral foot both are covered in a very hard circumferential callus. 05/09/18; right plantar foot wound over the third metatarsal head stable from last week. ooLeft plantar foot wound over the fifth metatarsal head also stable but with callus around both wound areas ooThe area on the left lateral foot had thick callus over a surface and I had some thoughts about leaving this intact however it felt boggy. ooWe've been using silver alginate all wounds 05/16/18; since the patient was last here he was  hospitalized from 5/15 through 5/19. 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 This grew MRSA. He was placed on vancomycin.MRI of the left foot suggested "progressive" osteomyelitis and bone destruction of the cuboid and the base of the fifth metatarsal. Stable erosive changes of the base of the second metatarsal. I'm wondering if they're aware that he had surgery and debridement in the area of the underlying bone previously by Dr. [name]. In any case, He was also revascularized with an angioplasty and stenting of the left tibial peroneal trunk and angioplasty of the left posterior tibial artery. He is on vancomycin at dialysis. He has a 2 week follow-up with infectious disease. He has vascular surgery follow-up. He was started on Plavix. 05/23/18; the patient's wound on the lateral left foot at the level of the fifth metatarsal head and the plantar wound on the plantar fifth metatarsal head both look better. The area on the right third metatarsal head still has depth and undermining. We've been using silver alginate. He is on vancomycin. He has been revascularized on the left 05/30/18; he continues on vancomycin at dialysis. Revascularized on the left. The area on the left lateral foot is just about closed. Unfortunately the area over the plantar fifth metatarsal head undermining superiorly as does the area over the right third metatarsal head. Both of these significantly deteriorated from last week. He has appointments next week with infectious disease and orthopedics I delayed putting on a cast on the left until those appointments which therefore we made we'll bring him in on Friday the 14th with the idea of a cast on the left foot 06/06/18; he continues on vancomycin at dialysis. Revascularized on the left. He arrives today with a ballotable swelling just above the area on the left lateral foot  where his previous wound was. He has no pain but he is reasonably insensate. The difficult areas on the plantar left fifth metatarsal head looks stable whereas the area on the right third plantar metatarsal head about the same as last week there is undermining here although I did not unroofed this today. He required a considerable debridement of the swelling on the left lateral foot area and I unroofed an abscess with a copious amount of brown purulent material which I obtained for culture. I don't believe during his recent hospitalization he had any further imaging although I need to review this. Previous cultures from this area done in this clinic showed MRSA, I would be surprised if this is not what this is currently even though he is on vancomycin. 06/13/18; he continues on vancomycin at dialysis however he finishes this on Sunday and then graduates the doxycycline previously prescribed by Dr. [name]. The abscess site on the lateral foot that I unroofed last time grew moderate amounts of methicillin-resistant staph aureus that is both vancomycin and tetracycline sensitive so we should be okay from that regard.  He arrives today in clinic with a connection between the abscess site and the area on the lateral foot. We've been using silver alginate to all his wound areas. The right third metatarsal head wound is measuring smaller. Using silver alginate on both wound areas 06/20/18; transitioning to doxycycline prescribed by Dr. [name]. The abscess site on the lateral foot that I unroofed 2 weeks ago grew MRSA. That area has largely closed down although the lateral part of his wound on the fifth metatarsal head still probes to bone. I suspect all of this was connected. ooOn the right third metatarsal head smaller looking wound but surrounded by nonviable tissue that once again requires debridement using silver alginate on both areas 06/27/18; on doxycycline 100 twice a day. The abscess on the  lateral foot has closed down. Although he still has the wound on the left fifth metatarsal head that extends towards the lateral part of the foot. The most lateral part of this wound probes to bone ooRight third metatarsal head still a deep probing wound with undermining. Nevertheless I elected not to debridement this this week ooIf everything is copious static next week on the left I'm going to attempt a total contact cast 07/04/18; he is tolerating his doxycycline. The abscess on the left lateral foot is closed down although there is still a deep wound here that probes to bone. We will use silver collagen under the total contact cast Silver collagen to the deep wound over the right third metatarsal head is well 07/11/18; he is tolerating doxycycline. The left lateral fifth plantar metatarsal head still probes deeply but I could not probe any bone. We've been using silver collagen and this will be the second week under the total contact cast ooSilver collagen to deep wound over the right third metatarsal head as well 07/18/18; he is still taking doxycycline. The left lateral fifth plantar metatarsal this week probes deeply with a large amount of exposed bone. Quite a deterioration. He required extensive debridement. Specimens of bone for pathology and CNS obtained ooAlso considerable debridement on the right third metatarsal head 07/25/18; bone for pathology last week from the lateral left foot showed osteomyelitis. CandS of the bone Enterobacter. And Klebsiella. He is now on ciprofloxacin and the doxycycline is stopped [Dr. [name] of infectious disease] area He has an appointment with Dr. [name] tomorrow I'm going to leave the total contact cast off for this week. May wish to try to reapply that next week or the week after depending on the wound bed looks. I'm not sure if there is an operative option here, previously is followed with Dr. [name] 08/01/18 on evaluation today patient presents for  reevaluation. He has been seen by infectious disease, Dr. [name], and he has placed the patient back on Ceftaz currently to be given to him at dialysis three times a week. 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 Fortunately there does not appear to be the evidence of infection at this point in time. Overall the patient has been tolerating the dressing changes without complication. We are no longer due lives in the cast of left foot secondary to the infection obviously. 08/10/18 on evaluation today patient appears to be doing rather well all things considered in regard to his left plantar foot ulcer. He does have a significant callous on the lateral portion of his left foot he wonders if I can help clean that away to some  degree today. Fortunately he is having no evidence of infection at this time which is good news. No fevers chills noted. With that being said his right plantar foot actually does have a significant callous buildup around the wound opening this seems to not be doing as well as I would like at this point. I do believe that he would benefit from sharp debridement at this site. Subsequently I think a total contact cast would be helpful for the right foot as well. 08/17/18; the patient arrived today with a total contact cast on the right foot. This actually looks quite good. He had gone to see Dr. Megan Salon of infectious disease about the osteomyelitis on the left foot. I think he is on IV Fortaz at dialysis although I am not exactly sure of the rationale for the Fortaz at the time of this dictation. He also arrived in today with a swelling on the lateral left foot this is the site of his original wounds in fact when I first saw this man when he was under Dr. Ardeen Garland care I think this was the site of where the wound was located. It was not particularly tender however using a small scalpel I opened it to [place] some  moderate amount of purulent drainage. 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 Last MRI was in May that showed progressive osteomyelitis and bone destruction of the cuboid and base of the fifth metatarsal. Last week he had an abscess over this same area this was removed. Culture was negative. ooWe are continuing with a total contact cast to the area on the third metatarsal head on the right and making some good progress here. The patient asked me again about renal transplant. He is not on the list because of the open wounds. 08/31/18; apparently the patient had an interruption in the [place] but he is now back on this at dialysis. Apparently there was a misunderstanding and Dr. Crissie Figures orders. Due to have the MRI of the left foot tonight. ooThe area on the right foot continues to have callus thick skin and subcutaneous tissue around the wound edge that requires constant debridement however the wound is smaller we are using a total contact cast in this area. Alginate all wounds 09/07/18; without much surprise the MRI of his left foot showed osteomyelitis in the reminiscent of his fourth metatarsal but also the third metatarsal. She arrives in clinic today with the right foot and a total contact cast. There was purulent drainage coming out of the wound which I have cultured. Marked deterioration here with undermining widely around the wound orifice. Using pickups and a scalpel I remove callus and subcutaneous tissue from a substantial new opening. ooIn a similar fashion the area on the left lateral foot that was blistered last week I have opened this and remove skin and subcutaneous tissue from this area to expose a obvious new wound. I think there is extension and communication between all of this on the left foot. ooThe patient is on Fortaz at dialysis. Culture  done of the right foot. We are clearly not making progress here. ooWe have made him an appointment with Dr. [name] of orthopedics. I think an amputation of the left leg may be discussed. I don't think there is anything that can be done with foot salvage. 09/14/18; culture from the right foot last time showed a very  resistant MRSA. This is resistant to doxycycline. I'm going to try to get linezolid at least for a week. He does not have a appointment with Dr. [name] yet. 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 I'll send a message to Dr. [name] of infectious disease. I want the patient to see Dr. [name] to go over the pros and cons of an amputation which I think will be a BKA. The patient had a question about whether this is curative or not. I told him that I thought it would be although spread of staph aureus infection is not unheard of. MRI of the right foot was done at the end of April 2019 did not show osteomyelitis. The patient last saw Dr. [name] on 9/3. I'll send Dr. [name] a message. I would really like him to weigh the pros and cons of a BKA on the left otherwise he'll probably need IV antibiotics and perhaps hyperbaric oxygen again. He has not had a good response to this in the past. His MRI earlier this month showed progressive damage in the remnants of the fourth and third metatarsal 09/21/2018; sees Dr. [name] of orthopedics next week to discuss a left BKA in response to the osteomyelitis in the left foot. Using silver alginate to all wound areas. He is completing the Zyvox I did put in for him after last culture showed MRSA 09/29/2018; patient saw Dr. [name] of orthopedics discussed the osteomyelitis in the left foot third and fourth metatarsals. He did not recommend urgent surgery but certainly stated the only surgical option would be a BKA. He is communicating with Dr. [name]. The problem here is the instability of the  areas on his foot which constantly generate draining abscesses. I will communicate with Dr. [name] about this. He has an appointment on 10/23 10/05/2018; patient sees Dr. [name] on 10/23. I have sent him a secure message not ordered any additional antibiotics for now. Patient continues to have 2 open areas on the left plantar foot at the fifth metatarsal head and the lateral aspect of the foot. These have not changed all that much. The area on the right third met head still has thick callus and surrounding subcutaneous tissue we have been using silver alginate 10/12/2018; patient sees Dr. [name] or colleague on 10/23. 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 The area on the right plantar foot is just about closed. The superior wound on the left fifth metatarsal head is callused over but I am not sure if this is closed. The area below it is about the same. Considerable amount of callus on the lateral foot. We have been using silver alginate to the wounds 10/19/2018; the patient saw Dr. [name] on 10/22. He wishes to try and continue to save the left foot. He has been given 6 weeks of oral ciprofloxacin. We continue to put a total contact cast on the right foot. The area over the fifth metatarsal head on the left is callused over/perhaps healed but I did not remove the callus to find out. He still has the wound on the left lateral midfoot requiring debridement. We have been using silver alginate to all wound areas 10/26/2018 ooOn the left foot our intake nurse noted some purulent drainage from the inferior wound [currently the only one that is not callused over] ooOn the right foot even though he is in total contact  cast a considerable amount of thick black callus and surface eschar. On this side we have been using silver alginate under a total contact cast. oohe remains on ciprofloxacin as prescribed by Dr.  [name] 11/02/2018; culture from last week which is done of the probing area on the left midfoot wound showed MRSA "few". I am going to need to contact Dr. [name] which I will do today I think he is going to need IV antibiotics again. The area on the left foot which was callused on the side is clearly separating today all of this was removed a copious amounts of callus and necrotic subcutaneous tissue. ooThe area on the right plantar foot actually remained healthy looking with a healthy granulated base. ooWe are using silver alginate to all wound areas 11/09/2018; unfortunately neither 1 of the patient's wounds areas looks at all satisfactory. On the left he has considerable necrotic debris from the plantar wound laterally over the foot. I removed copious amounts of material including callus skin and subcutaneous tissue. On the right foot he arrives with undermining and frankly purulent drainage. Specimen obtained for culture and debridement of the callused skin and subcutaneous tissue from around the circumference. Because of this I cannot put him back in a total contact cast but to be truthful we are really unfortunately not making a lot of progress I did put in a secure text message to Dr. [name] wondering about the MRSA on the left foot. He suggested vancomycin although this is not started. I was left wondering if he expected me to send this into dialysis. Has we have more purulence on the right side wait for that result before calling dialysis 11/16/2018; I called dialysis earlier this week to get the vancomycin started. I think he got the first dose on Tuesday. The patient tells me that he fell earlier this week twisting his left foot and ankle and he is swelling. He continues to not look well. He had blood cultures done at dialysis on Tuesday he is not been informed of the results. 12/07/2018; the patient was admitted to hospital from 11/22/2018 through 12/02/2018. He underwent a left  BKA. Apparently had staph sepsis. In the meantime being off the right foot this is closed over. He follows up with Dr. 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 Subsequent to this he actually had multiple falls in fact he tells me to following which calls the wound to be his apparently. He has been seeing Dr. [name] and his physician assistant in the interim. They actually did want him to come back for reevaluation to see if there's anything we can do for me wound care perspective the help this area to heal more appropriately. The patient states that he does not have a tremendous amount of pain which is good news. No fevers, chills, nausea, or vomiting noted at this time. We have gotten approval from Dr. [name] office had [place] for [place] to treat the patient for his stop wound. This is due to the fact that the patient was in the 90 day postop global. 1/21; the patient does not have an open wound on the right foot although he does have some pressure areas that will need to be padded when he is transferring. The dehisced surgical wound looks clean although there is some undermining of 1.5 cm superiorly. We have been using calcium alginate 1/28; patient arrives in our  clinic for review of the left BKA stump wound. This appears healthy but there is undermining. TheraSkin #1 2/11; TheraSkin #2 2/25; TheraSkin #3. Wound is measuring smaller 3/10; TheraSkin #4 wound is measuring smaller 3/24; TheraSkin #5 wound is measuring smaller and looks healthy. 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 He has no open area on the right foot although he does have some callused area over the original third metatarsal head wound. The third metatarsal is also subluxed on the right foot. I have warned him today that he cannot consider wearing a prosthesis for  at least a month but he can go for measurements. He is going to need to keep the right foot padded is much as possible in his diabetic shoe indefinitely READMISSION 06/12/2019 Mr. Riddles is a type II diabetic on dialysis. He has been in this clinic multiple times with wounds on his bilateral lower extremities. 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 He also has a history of wounds on his right foot but tells me that everything is going well here. He actually came in here with his prosthesis walking with a cane. We were all really quite gratified to see this He states over the last several weeks he has had a painful area at the tip of the left third finger. His dialysis shunt in his is in the left upper arm and this particularly hurts during dialysis when his fingers get numb and there is a lot of pain in the tip of his left third finger. I believe he is seen vascular surgery. He had a Doppler done to evaluate for a dialysis steal syndrome. He is going for a banding procedure 1 week from today towards the left AV fistula. I think if that is unsuccessful they will be looking at creating a new shunt. He had a course of doxycycline when he which he finished about a week ago. He has been using Bactroban to the finger 6/25; the patient had his banding procedure and things seem to be going better. He is not having pain in his hand at dialysis. The area on the tip of his finger seems about closed. He did however have a paronychia I the last time I saw him. I have been advising him to use topical Bactroban washing the finger. Culture I did last time showed a few staph epidermidis which I was willing to dismiss as superficial skin contaminant. He arrives today with the finger wound looking better however the paronychia a seems worse 7/9; 2-week follow-up. He does not have an open wound on the tip of his  third left finger. I thought he had an initial ischemic wound on the tip of the finger as well as a paronychia a medially. All of this appears to be closed. 8/6-Patient returns after 3 weeks for follow-up and has a right second met head plantar callus with a significant area of maceration hiding an ulcer ABI repeated today on right - 1.26 8/13-Patient returns at 1 week, the right second met head plantar wound appears to be slightly better, it is surrounded by the callus area we are using silver alginate 8/20; the patient came back to clinic 2 weeks ago with a wound on the right second metatarsal head. He apparently told me he developed callus in this area but also went away from  his custom shoes to a pair of running shoes. Using silver alginate. 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 There might be much we can do to offload this although the patient tells me he has a wheelchair that he is using to try and stay off the wound is much as possible 8/27; right second metatarsal head. Still small punched out area with thick callus and subcutaneous tissue around the wound. We have been using silver collagen. This is always been an issue with this man's wound especially on this foot 9/3; right second met head. Still the same punched-out area with thick callus and subcutaneous tissue around the wound removing the circumference demonstrates repetitively undermining area. I have removed all the subcutaneous tissue associated with this. We have been using silver collagen 9/15; right second metatarsal head. Same punched-out thick callus and subcutaneous tissue around the wound area. Still requiring debridement we have been using silver collagen I changed back to silver alginate X-ray last time showed no evidence of osteomyelitis. Culture showed Klebsiella and he was started and Keflex apparently just 4 days ago 9/24; right second metatarsal head. He is completed the antibiotics I  gave him for 10 days. Same punched-out thick callus and subcutaneous tissue around the area. Still requiring debridement. We have been using silver alginate. The area itself looks swollen and this could be just [place] that comes on this area with walking on a prosthesis on the left however I think he needs an MRI and I am going to order that today. There is not an option to offload this further 10/1; right second plantar metatarsal head. MRI booked for October 6. Generally looking better today using silver alginate 10/8; right second plantar metatarsal head. MRI that was done on 10/6 was negative for osteomyelitis noted prior amputations of the second and fourth toes at the [facility]. Prior amputation of the third toe to the base of the middle phalanx. Prior amputation of the fifth toe to the head of the metatarsal. 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 He does not recall about hearing about this. He did not have any pain but then again he has reduced sensation from diabetic neuropathy 10/15; right second plantar metatarsal head. Still in the same condition callus nonviable subcutaneous tissue which cleans up nicely with debridement but reforms by the next week. The patient tells me that he is offloading this is much as he can including taking his wheelchair to dialysis. We have been using silver alginate, changed to silver collagen today 10/22; right second plantar metatarsal head. Wound measures smaller but still thick callus around this wound. Still requiring debridement 11/5 right second plantar metatarsal head. Less callus around the wound circumference but still requiring debridement. There is still some undermining which I cleaned out the wound looks healthy but still considerable punched out depth with a relatively small circumference to the wound there is no palpable bone no purulent drainage 11/12; right second plantar metatarsal head.  Small wound with thick callus tissue around the wound and significant undermining. We have been using silver collagen after my continuous debridements of this area 11/19; patient arrives in today with purulent drainage coming out of the wound in the second third met head area on the right foot. Serosanguineous thick drainage. Specimen obtained for culture. 11/21/2019 on evaluation today patient appears to be doing well with regard to his foot ulcer. The good news is is culture  came back negative for any bacteria there was no growth. The other good news is his x-ray also appears to be doing well. The foot is also measuring much better than what it was. Overall I am very pleased with how things seem to be progressing. 12/22; patient was in [facility] for several weeks due to the death of a family member. He said he stayed off the wound using silver alginate. 01/03/2020. Patient has a small open area with roughly 0.3 cm of circumferential undermining. The orifice looks smaller. We have been using silver collagen. There is not a wound more aggressive way to offload this area as he has a prosthesis on the other leg 1/21; again the same small area is 2 weeks ago. We have been using silver collagen I change that to endoform today I really believe that this is an offloading issue 2/4; the area on the right third met head. We have been using endoform. 2/11 the area on the right third met head we have been using silver alginate. 2/25; the area on the right third met head. There is much less callus on this today. The patient states he is offloading this even more aggressively. As an example he is taking his wheelchair into dialysis on most days 3/4; right third metatarsal head. Again he has eschar over the surface of the wound which I removed with a #3 curette. Some subcutaneous debris. This has 0.8 cm of direct probing depth. I cannot feel any bone here. I did do a culture the  area Objective Constitutional Patient is hypotensive. However he appears well this is not unusual for him. Pulse regular and within target range for patient.Marland Kitchen Respirations regular, non-labored and within target range.. Temperature is normal and within the target range for the patient.Marland Kitchen Appears in no distress. Vitals Time Taken: 1:25 PM, Height: 73 in, Weight: 202 lbs, BMI: 26.6, Temperature: 97.9 F, Pulse: 79 bpm, Respiratory Rate: 18 breaths/min, Blood Pressure: 79/42 mmHg, Capillary Blood Glucose: 140 mg/dl. General Notes: patient stated CBG this morning was 140. MD notified and will assess General Notes: Wound exam; tunneling wound. Callus around this over it removed with a #3 curette there is still 0.7 cm of probing depth this is worse than I measured last week at 0.4 cm. Integumentary (Hair, Skin) Wound #41 status is Open. Original cause of wound was Gradually Appeared. The wound is located on the Right Metatarsal head second. The wound measures 0.4cm length x 0.4cm width x 0.8cm depth; 0.126cm^2 area and 0.101cm^3 volume. There is Fat Layer (Subcutaneous Tissue) Exposed exposed. There is no tunneling noted, however, there is undermining starting at 7:00 and ending at 3:00 with a maximum distance of 0.8cm. There is a medium amount of serosanguineous drainage noted. The wound margin is well defined and not attached to the wound base. There is large (67-100%) pink granulation within the wound bed. There is no necrotic tissue within the wound bed. 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 There was a Excisional Skin/Subcutaneous Tissue Debridement with a total area of 0.36 sq cm performed by Ricard Dillon., MD. With the following instrument(s): Curette  to remove Viable and Non-Viable tissue/material. Material removed includes Callus, Subcutaneous Tissue, Skin: Dermis, and Fibrin/Exudate after achieving pain control using Lidocaine 4% Topical Solution. A time out was conducted at 13:40, prior to the start of the procedure. A Minimum amount of bleeding was controlled with Pressure. The procedure was tolerated well with a pain level of 0 throughout and a pain level of 0 following the procedure. Post Debridement Measurements: 0.6cm length x 0.6cm width x 0.8cm depth; 0.226cm^3 volume. Character of Wound/Ulcer Post Debridement requires further debridement. Severity of Tissue Post Debridement is: Fat layer exposed. Post procedure Diagnosis Wound #41: Same as Pre-Procedure Plan Follow-up Appointments: Return Appointment in 1 week. - Thursday Dressing Change Frequency: Wound #41 Right Metatarsal head second: Change dressing every day. Wound Cleansing: May shower and wash wound with soap and water. Primary Wound Dressing: Wound #41 Right Metatarsal head second: Skin Substitute Application - run insurance for approval for oasis. Other: - Iodoform packing strips. pack into wound. Secondary Dressing: Wound #41 Right Metatarsal head second: Foam - foam donut Kerlix/Rolled Gauze Dry Gauze Other: - felt to periwound and place one below wound. lightly wrap coban to hold dressing in place. felt / pad wound Edema Control: Avoid standing for long periods of time Elevate legs to the level of the heart or above for 30 minutes daily and/or when sitting, a frequency of: - throughout the day. Off-Loading: Other: - felt to periwound and place one below wound. Additional Orders / Instructions: Other: - minimize walking and standing on foot to aid in wound healing  and callus buildup. Laboratory ordered were: Anerobic culture - Culture of right foot wound. 1. I switch the dressing to iodoform 2. Single-layer Oasis through his insurance 3. Culture for CandS but no empiric antibiotics 4. There is no palpable bone here but this is a solitary punched out wound I will need to review her imaging imaging studies Electronic Signature(s) Signed: 02/29/2020 5:21:33 PM By: Linton Ham MD Signed: 03/03/2020 5:59:01 PM By: Levan Hurst RN, BSN Previous Signature: 02/28/2020 5:41:03 PM Version By: Linton Ham MD Entered By: Levan Hurst on 02/29/2020 08:57:32 -------------------------------------------------------------------------------- [place] Details Patient Name: Date of Service: RUGER, SAXER 02/28/2020 Medical Record [id number] Patient Account Number: [id number] Date of Birth/Sex: Treating RN: May 10, 1951 (69 y.o. [name], Meta.Reding Primary Care Provider: [name] Other Clinician: Referring Provider: Treating Provider/Extender:Timia Casselman, Jeronimo Greaves, Buena Irish in Treatment: 37 Diagnosis Coding ICD-10 Codes Code Description E11.621 Type 2 diabetes mellitus with foot ulcer L97.512 Non-pressure chronic ulcer of other part of right foot with fat layer exposed I70.298 Other atherosclerosis of native arteries of extremities, other extremity L03.115 Cellulitis of right lower limb Facility Procedures The patient participates with Medicare or their insurance follows the Medicare Facility Guidelines: CPT4 Code Description Modifier Quantity [id number] - DEB SUBQ TISSUE 20 SQ CM/< 1 ICD-10 Diagnosis Description 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

## 2020-03-01 LAB — AEROBIC CULTURE W GRAM STAIN (SUPERFICIAL SPECIMEN)
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
## 2020-03-06 ENCOUNTER — Encounter (HOSPITAL_BASED_OUTPATIENT_CLINIC_OR_DEPARTMENT_OTHER): Payer: Medicare Other | Admitting: Internal Medicine

## 2020-03-06 ENCOUNTER — Other Ambulatory Visit: Payer: Self-pay

## 2020-03-06 DIAGNOSIS — E11621 Type 2 diabetes mellitus with foot ulcer: Secondary | ICD-10-CM | POA: Diagnosis not present

## 2020-03-08 NOTE — Progress Notes (Signed)
DEMERIUS, PODOLAK ([id number]) Visit Report for 03/06/2020 Cellular or Tissue Based Product Details Patient Name: Date of Service: [name], [name] 03/06/2020 1:30 PM Medical Record [id number] Patient Account Number: [id number] Date of Birth/Sex: [birth year] (68 y.o. M) Treating RN: Deon Pilling Primary Care Provider: [name] Other Clinician: Referring Provider: Treating Provider/Extender:Robson, Birdena Crandall in Treatment: 38 Cellular or Tissue Based Wound #41 Right Metatarsal head second Product Type Applied to: Performed By: Physician Ricard Dillon., MD Cellular or Tissue Based Oasis Burn Matrix Product Type: Level of Consciousness (Pre- Awake and Alert procedure): Pre-procedure Yes - 14:16 Verification/Time Out Taken: Location: genitalia / hands / feet / multiple digits Wound Size (sq cm): 0.09 Product Size (sq cm): 10 Waste Size (sq cm): 2 Waste Reason: wound size Amount of Product Applied (sq cm): 8 Instrument Used: Forceps, Scissors Lot #: [id number] Order #: 1 Expiration Date: 08/20/2021 Fenestrated: No Reconstituted: Yes Solution Type: normal saline Solution Amount: 30m Lot #: [id number] Expiration Date: 08/26/2021 Secured: Yes Secured With: Steri-Strips, adaptic Dressing Applied: No Procedural Pain: 0 Post Procedural Pain: 0 Response to Treatment: Procedure was tolerated well Level of Consciousness Awake and Alert (Post-procedure): Post Procedure Diagnosis Same as Pre-procedure Electronic Signature(s) Signed: 03/08/2020 7:03:15 AM By: RLinton HamMD Previous Signature: 03/06/2020 6:00:33 PM Version By: DDeon PillingEntered By: RLinton Hamon 03/07/2020 07:45:01 -------------------------------------------------------------------------------- Debridement Details Patient Name: Date of Service: [name] Sages3/10/2020 1:30 PM Medical Record [id number] Account Number: [id number] of  Birth/Sex: 1Oct 05, 1952(68 y.o. M) Treating RN: DDeon PillingPrimary Care Provider: JKristie CowmanOther Clinician: Referring Provider: Treating Provider/Extender:Robson, MBirdena Crandallin Treatment: 38 Debridement Performed for Wound #41 Right Metatarsal head second Assessment: Performed By: Physician RRicard Dillon, MD Debridement Type: Debridement Severity of Tissue Pre Fat layer exposed Debridement: Level of Consciousness (Pre- Awake and Alert procedure): Pre-procedure Yes - 14:10 Verification/Time Out Taken: Start Time: 14:11 Pain Control: Lidocaine 4% Topical Solution Total Area Debrided (L x W): 1.5 (cm) x 1.5 (cm) = 2.25 (cm) Tissue and other material Viable, Non-Viable, Callus, Subcutaneous, Skin: Dermis , Fibrin/Exudate debrided: Level: Skin/Subcutaneous Tissue Debridement Description: Excisional Instrument: Curette Bleeding: Moderate Hemostasis Achieved: Pressure End Time: 14:15 Procedural Pain: 0 Post Procedural Pain: 0 Response to Treatment: Procedure was tolerated well Level of Consciousness Awake and Alert (Post-procedure): Post Debridement Measurements of Total Wound Length: (cm) 0.7 Width: (cm) 0.7 Depth: (cm) 0.5 Volume: (cm) 0.192 Character of Wound/Ulcer Post Improved Debridement: Severity of Tissue Post Debridement: Fat layer exposed Post Procedure Diagnosis Same as Pre-procedure Electronic Signature(s) Signed: 03/07/2020 5:45:00 PM By: DDeon PillingSigned: 03/08/2020 7:03:15 AM By: RLinton HamMD Previous Signature: 03/06/2020 6:00:33 PM Version By: DDeon PillingEntered By: RLinton Hamon 03/07/2020 07:45:10 -------------------------------------------------------------------------------- HPI Details Patient Name: Date of Service: [name] Sages3/10/2020 1:30 PM Medical Record [id number] Account Number: [id number] of Birth/Sex: Treating RN: [id number](69y.o. MHessie DienerPrimary Care  Provider: JKristie CowmanOther Clinician: Referring Provider: Treating Provider/Extender:Robson, MJeronimo Greaves EBuena Irishin Treatment: 38 History of Present Illness Location: Patient presents with a wound to bilateral feet. Quality: Patient reports experiencing essentially no pain. Severity: Mildly severe wound with no evidence of infection Duration: Patient has had the wound for greater than 2 weeks prior to presenting for treatment HPI Description: The patient is a pleasant 69yrs old bm here for evaluation of ulcers on the plantar aspect of both feet. 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 He has a left arm graft for access. He has been trying to stay off his feet for weeks but does not seem to have any improvement in the wound. He has been seeing someone at the foot center and was referred to the Wound Care center for further evaluation. 12/30/15 the patient has 3 wounds one over the right first metatarsal head and 2 on the left foot at the left fifth and lleft first metatarsal head. All of these look relatively similar. The one over the left fifth probe to bone I could not prove that any of the others did. I note his MRI in November that did not show osteomyelitis. His peripheral pulses seem robust. All of these underwent surgical debridement to remove callus nonviable skin and subcutaneous tissue 01/06/16; the patient had his sutures removed from the right fifth ray amputation. There may be a small open part of this superiorly but otherwise the incision looks good. Areas over his right first, left first and left fifth metatarsal head all underwent surgical debridement as a varying degree of callus, skin and nonviable subcutaneous tissue. The area that is most worrisome is the right fifth metatarsal head which has a wound probes precariously close to bone. There is no purulent drainage or erythema 01/13/16; I'm not exactly sure of  the status of the right fifth ray amputation site however he follows with Dr. [name] later this week. The area over his right first plantar metatarsal head, left first and fifth plantar metatarsal head are all in the same status. Thick circumferential callus, nonviable subcutaneous tissue. Culture of the left fifth did not culture last week 01/20/16; all of the patient's wounds appear and roughly the same state although his amputation site on the right lateral foot looks better. His wounds over the right first, left first and left fifth metatarsal heads all underwent difficult surgical debridement removing circumferential callus nonviable skin and subcutaneous tissue. There is no overt evidence of infection in these areas. MRI at the end of December of the left foot did not show osteomyelitis, right foot showed osteomyelitis of the right fifth digit he is now status post amputation. 01/27/16 the patient's wounds over his plantar first and fifth metatarsal heads on the left all appear better having been started on a total contact cast last week. They were debridement of circumferential callus and nonviable subcutaneous tissue as was the wound over the first metatarsal head. His surgical incision on the right also had some light surface debridement done. 03/23/2016 -- the patient was doing really well and most of his wounds had almost completely healed but now he came back today with a history of having a discharge from the area of his right foot on the plantar aspect and also between his first and second toe. He also has had some discharge from the left foot. Addendum: I spoke to the PA [name] at the dialysis center whose fax number is [phone]. We discussed the infection the patient has and she will put the patient on vancomycin and Fortaz until the final culture report is back. We will fax this as soon as available. 03/30/2016 -- left foot x-ray IMPRESSION:No definitive  osteomyelitis noted. X-ray of the right foot -- IMPRESSION: 1. Soft tissue swelling. Prior amputation right fifth digit. No acute or focal bony abnormality identified. If osteomyelitis remains a clinical concern, MRI can be obtained. 2. Peripheral vascular disease. His culture reports have grown an MSSA -- and  we will fax this report to his hemodialysis center. He was called in a prescription of oral doxycycline but I have told her not to fill this in as he is already on IV antibiotics. 04/06/2016 -- a few days ago, I spoke to the hemodialysis center nurse who had stopped the IV antibiotics and he was given a prescription for doxycycline 100 mg by mouth twice a day for a week and he is on this at the present time. 05/11/2016 -- he has recently seen his PCP this week and his hemoglobin A1c was 7. He is working on his paperwork to get his orthotic shoes. 05/25/2016 -- -- x-ray of the left foot IMPRESSION:No acute bony abnormality. No radiographic changes of acute osteomyelitis. No change since prior study. X-ray of the right foot -- IMPRESSION: Postsurgical changes are seen involving the fifth toe. No evidence of acute osteomyelitis. he developed a large blister on the medial part of his right foot and this opened out and drain fluid. 06/01/2016 -- he has his MRI to be done this afternoon. 06/15/2016 -- MRI of the left forefoot without contrast shows 2 separate regions of cutaneous and subcutaneous edema and possible ulceration and blistering along the ball of the foot. No obvious osteomyelitis identified. MRI of the right foot showed cutaneous and subcutaneous thickening plantar to the first digit sesamoid with an ulcer crater but no underlying osteomyelitis is identified. 06/22/2016 -- the right foot plantar ulcer has been draining a lot of seropurulent material for the last few days. 06/30/2016 -- spoke to the dialysis center and I believe I spoke to [place] a PA at the center who discussed  with me and agreed to putting Legrand Como on vancomycin until his cultures arrive. On review of his culture report no WBCs were seen or no organisms were seen and the culture was reincubated for better growth. The final report is back and there were no predominant growth including Streptococcus or Staphylococcus. Clinically though he has a lot of drainage from both wounds a lot of undermining and there is further blebs on the left foot towards the interspace between his first and second toe. 08/10/2016 -- the culture from the right foot showed normal skin flora and there was no Staphylococcus aureus was group A streptococcus isolated. 09/21/2016 -- -- MRI of the right foot was done on 09/13/2016 - IMPRESSION: Findings most consistent with acute osteomyelitis throughout the great toe, sesamoid bones and plantar aspect of the head of the first metatarsal. Fluid in the sheath of the flexor tendon of the great toe could be sympathetic but is worrisome for septic tenosynovitis. First MTP joint effusion worrisome for septic joint. He was admitted to the hospital on 09/11/2016 and treated for a fever with vancomycin and cefepime. He was seen by Dr. Bobby Rumpf of infectious disease who recommended 6 weeks treatment with vancomycin and ceftazidime with his hemodialysis and to continue to see as in the wound clinic. Vascular consult was pending. The patient was discharged home on 09/14/2016 and he would continue with IV antibiotics for 6 weeks. 09/28/16 wound appears reasonably healthy. Continuing with total contact cast 10/05/16 wound is smaller and looking healthy. Continue with total contact cast. He continues on IV antibiotics 10/12/2016 -- he has developed a new wound on the dorsal aspect of his left big toe and this is a superficial injury with no surrounding cellulitis. He has completed 30 days of IV antibiotics and is now ready to start his hyperbaric oxygen therapy as per his  insurance  company's recommendation. 10/19/2016 -- after the cast was removed on the right side he has got good resolution of his ulceration on the right plantar foot. he has a new wound on the left plantar foot in the region of his fourth metatarsal and this will need sharp debridement. 10/26/2016 -- Xray of the right foot complete: IMPRESSION: Changes consistent with osteomyelitis involving the head of the first metatarsal and base of the first proximal phalanx. The sesamoid bones are also likely involved given their positioning. 11/02/2016 -- there still awaiting insurance clearance for his hyperbaric oxygen therapy and hopefully he will begin treatment soon. 11/09/2016 -- started with hyperbaric oxygen therapy and had some barotrauma to the right ear and this was seen by ENT who was prescribed Afrin drops and would probably continue with HBO and he is scheduled for myringotomy tubes on Friday 11/16/2016 -- he had his myringotomy tubes placed on Friday and has been doing much better after that with some fluid draining out after hyperbaric treatment today. The pain was much better. 11/30/2016 -- over the last 2 days he noticed a swelling and change of color of his right second toe and this had been draining minimal fluid. 12/07/2016 -- x-ray of the right foot -- IMPRESSION:1. Progressive ulceration at the distal aspect of the second digit with significant soft tissue swelling and osseous changes in the distal phalanx compatible with osteomyelitis. 2. Chronic osteomyelitis at the first MTP joint. 3. Ulcerations at the second and third toes as well without definite osseous changes. Osteomyelitis is not excluded. 4. Fifth digit amputation. On 12/01/2016 I spoke to Dr. Bobby Rumpf, the infectious disease specialist, who kindly agreed to treat this with IV antibiotics and he would call in the order to the dialysis center and this has been discussed in detail with the patient who will make the  appropriate arrangements. The patient will also book an appointment as soon as possible to see Dr. [name] in the office. Of note the patient has not been on antibiotics this entire week as the dialysis center did not receive any orders from Dr. [name]. I got in touch with Dr. [name] who tells me the patient has an appointment to see him this coming Wednesday. 12/14/2016 -- the patient is on ceftazidime and vancomycin during his dialysis, and I understand this was put on by his nephrologist Dr. Raliegh Ip possibly after speaking with infectious disease Dr. [name] 12/23/16; patient was on my schedule today for a wound evaluation as he had difficulties with his schedule earlier this week. I note that he is on vancomycin and ceftazidine at dialysis. In spite of this he arrives today with a new wound on the base of the right second toe this easily probes to bone. The known wound at the tip of the second toe With this area. He also has a superficial area on the medial aspect of the third toe on the side of the DIP. This does not appear to have much depth. The area on the plantar left foot is a deep area but did not probe the bone 12/28/16 -- he has brought in some lab work and the most recent labs done showed a hemoglobin of 12.1 hematocrit of 36.3, neutrophils of 51% WBC count of 5.6, BN of 53, albumin of 4.1 globulin of 3.7, vancomycin 13 g per mL. 01/04/2017 -- he saw Dr. [name] who has recommended a amputation of the right second toe and he is awaiting this date. He sees Dr. Bobby Rumpf  of infectious disease tomorrow. The bone culture taken on 12/28/2016 had no growth in 2 days. 01/11/2017 -- was seen by Dr. Bobby Rumpf regarding the management and has recommended a eval by vascular surgeons. He recommended to continue the antibiotics during his hemodialysis. Xray of the left foot -- IMPRESSION: No acute fracture, dislocation, or osseous erosion identified. 01/18/2017 -- he has his vascular workup  later today and he is going to have his right foot second toe amputation this coming Friday by Dr. [name]. We have also put in for a another 30 treatments with hyperbaric oxygen therapy 01/25/2017 -- I have reviewed Dr. Donnetta Hutching is vascular report from last week where he reviewed him and thought his vascular function was good enough to heal his amputation site and no further tests were recommended. He also had his orthopedic related to surgery which is still pending the notes and we will review these next week. 02/01/2017 -- the operative remote of Dr. 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 02/08/2017 --he was seen by Dr. [name] on 02/03/2017 -- patient is on Leonard. after a thorough review he had recommended to continue with antibiotics during hemodialysis. The patient was seen by Dr. [name], but I do not find any follow-up note on the electronic medical record. he has removed the dressing over the right foot to remove the sutures and asked him to see him back in a month's time 02/22/2017 -- patient has been febrile and has been having symptoms of the upper respiratory tract infection but has not been checked for the flu and it's been over 4 days now. He says he is feeling better today. Some drainage between his left first and second toe and this needed to be looked at 03/08/2017 -- was seen by Dr. Bobby Rumpf on 03/07/2017 after review he will stop his antibiotics and see him back in a month to see if he is feeling well. 03/15/2017 -- he has completed his course of hyperbaric oxygen therapy and is doing fine with his health  otherwise. 03/22/2017 -- his nutritionist at the dialysis center has recommended a protein supplement to help build his collagen and we will prescribe this for him when he has the details 05/03/2017 -- he recently noticed the area on the plantar aspect of his fifth metatarsal head which opened out and had minimal drainage. 05/10/2017 -- -- x-ray of the left foot -- IMPRESSION:1. No convincing conventional radiographic evidence of active osteomyelitis.2. Active soft tissue ulceration at the tip of the fifth digit, and at the lateral aspect of the foot adjacent the base of the fifth metatarsal. 3. Surgical changes of prior fourth toe amputation. 4. 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 5. Small vessel atherosclerotic vascular calcifications. 6. 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 ======= Readmission after 5 weeks: 06/15/2017 -- the patient returns after 5 weeks having had an MRI done on 05/17/2017 MRI of the left foot without contrast showed soft tissue ulcer overlying the base of the fifth metatarsal. 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 No drainable fluid collection to suggest  an abscess. He was in hospital between 05/16/2017 and 05/25/2017 -- and on discharge was asked to follow-up with Dr. [name] and Dr. [name]. He was treated 2 weeks post discharge with vancomycin plus ceftriaxone with hemodialysis and oral metronidazole 500 mg 3 times a day. 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 last hemoglobin A1c was 6.7% He had a postoperative MR of the foot on 05/23/2017 -- which showed no new areas of cortical bone loss, edema or soft tissue ulceration to suggest osteomyelitis. the patient has completed his course of IV antibiotics during dialysis and is to see the infectious  disease doctor tomorrow. Dr. [name] had seen him after suture removal and asked him to keep the wound with a dry dressing 06/21/2017 -- he was seen by Dr. Bobby Rumpf of infectious disease on 06/16/2017 -- he stopped his Flagyl and will see him back in 6 weeks. He was asked to follow-up with Dr. [name] and with the wound clinic clinic. 07/05/17; bone biopsy from last time showed acute osteomyelitis. This is from the reminiscent in the left fifth metatarsal. Culture result is apparently still pending [holding for anaerobe}. From my understanding in this case this is been a progressive necrotic wound which is deteriorated markedly over the last 3 weeks since he returned here. He now has a large area of exposed bone which was biopsied and cultured last week. Dr. Graylon Good has put him on vancomycin and Fortaz during his hemodialysis and Flagyl orally. He is to see Dr. [name] next week 07/19/2017 -- the patient was reviewed by Dr. [name] of orthopedics who reviewed the case in detail and agreed with the plan to continue with IV antibiotics, aggressive wound care and hyperbaric oxygen therapy. He would see him back in 3 weeks' time 08/09/2017 -- saw Dr. Bobby Rumpf on 08/03/2017 -he was restarted on his antibiotics for 6 weeks which included vanco, ceftaz and Flagyl. he recommended continuing his antibiotics for 6 weeks and reevaluate his completion date. He would continue with wound care and hyperbaric oxygen therapy. 08/16/2017 -- I understand he will be completing his 6 weeks of antibiotics sometime later this week. 09/19/2017 -- he has been without his wound VAC for the last week due to lack of supplies. He has been packing his wound with silver alginate. 10/04/2017 -- he has an appointment with Dr. [name] tomorrow and hence we will not apply the wound VAC after his dressing changes today. 10/11/2017 -- he was seen by Dr. Bobby Rumpf on 10/05/2017, noted that the patient was currently  off antibiotics, and after thorough review he recommended he follow-up with the wound care as per plan and no further antibiotics at this point. 11/29/17; patient is arrived for the wound on his left lateral foot.Marland Kitchen He is completed IV antibiotics and 2 rounds of hyperbaric oxygen for treatment of underlying osteomyelitis. He arrives today with a surface on most of the wound area however our intake nurse noted drainage from the superior aspect. This was brought to my attention. 12/13/2017 -- the right plantar foot had a large bleb and once the bleb was opened out a large callus and subcutaneous debris was removed and he has a plantar ulcer near the fourth metatarsal head 12/28/17 on evaluation today patient appears to be doing acutely worse in regard to his left foot. The wound which has been appearing to do better as now open up more deeply there is bone palpable  at the base of the wound unfortunately. He tells me that this feels "like it did when he had osteomyelitis previously" he also noted that his second toe on the left foot appears to be doing worse and is swollen there does appear to be some fluid collected underneath. His right foot plantar ulcer appears to be doing somewhat better at this point and there really is no complication at the site currently. No fevers, chills, nausea, or vomiting noted at this time. Patient states that he normally has no pain at this site however. Today he is having significant pain. 01/03/18; culture done last week showed methicillin sensitive staph aureus and group B strep. He was on Septra however he arrives today with a fever of 101. He has a functional dialysis shunt in his left arm but has had no pain here. No cough. He still makes some urine no dysuria he did have abdominal pain nausea and diarrhea over the weekend but is not had any diarrhea since yesterday. Otherwise he has no specific complaints 01/05/18; the patient returns today in follow-up for his  presentation from 1/8. At that point he was febrile. I gave him some Levaquin adjusted for his dialysis status. He tells me the fever broke that night and it is not likely that this was actually a wound infection. He is gone on to have an MRI of the left foot; this showed interval development of an abnormal signal from the base of the third and fourth metatarsal and in the cuboid reminiscent consistent with osteomyelitis. There is no mention of the left second toe I think which was a concern when it was ordered. The patient is taking his Levaquin 01/10/18; the patient has completed his antibiotics today. The area on the left lateral foot is smaller but it still probes easily to bone. He has underlying osteomyelitis here and sees Dr. Megan Salon of infectious disease this week The area on the right third plantar metatarsal head still requires debridement not much change in dimensions He has a new wound on the medial tip of his right third toe again this probes to bone. He only noticed this 2 days ago 01/17/18; the patient saw Dr. [name] last week and he is back on Vanco and Fortaz at dialysis. 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 We are previously seeing him for a wound also on the base of the fourth med head on the right. X- ray of this area did not show osteomyelitis in relation to a new wound on the tip of his right third toe. Again this probes to bone. Finally his second toe on the left which didn't really show anything on the MRI at least no report appears to have a separated cutaneous area which I think is going to come right off the tip of his toe and leave exposed bone. Whether this is infectious or ischemic I am not clear Culture I did from the third toe last week which was his new wound was negative. Plain x-ray on the right foot did not show osteomyelitis in the  toes 01/24/18; the patient is going went to see Dr. [name]. He is going to have a amputation of the left second and right third toe although paradoxically the left second toe looks better than last week. We have treating a deep probing wound on the left lateral foot and the area  over the third metatarsal head on the right foot. 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 Also a debridement including bone of the left lateral foot and a closure which is still sutured. This is a bit surprising. Also apparent debridement of the base of the right third toe wound. 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 I'm not sure exactly where we are in the frame of things here. The wound on the left lateral foot is still sutured also the amputation sites of the left second and right third toe. He is been using Xeroform to the sutured areas silver alginate to the right foot 02/14/18; he continues on Rocephin at dialysis for osteomyelitis. I still don't have a good sense of where we are in the treatment duration. The wounds on the left lateral foot had the sutures removed and this clearly still probes deeply. We debrided the area with a #5 curet. We'll use silver alginate to the wound the area over the right third met head also required debridement of callus skin and subcutaneous tissue and necrotic debris over the wound surface. This tunnels superiorly but I did not unroofed this today. The areas on the tip of his toes have a crusted surface eschar I did not debridement this either. 02/21/18; he continues on Rocephin at dialysis for osteomyelitis. I don't have a good sense of where we are and the treatment duration. The wounds on the left lateral foot is callused over on presentation but  requires debridement. The area on the right third med head plantar aspect actually is measuring smaller 02/28/18;patient is on Vanco and Fortaz at dialysis, I'm not sure why I thought this was Rocephin above unless it is been changed. I don't have a good sense of time frame here. Using silver collagen to the area on the left lateral foot and right third med head plantar foot 03/07/18; patient is completing bank and Fortaz at dialysis soon. He is using Silver collagen to the area on the left lateral foot and the right third metatarsal head. He has a smaller superficial wound distal to the left lateral foot wound. 03/14/18-he is here in follow-up evaluation for multiple ulcerations to his bilateral feet. He presents with a new ulceration to the plantar aspect of the left foot underneath the blister, this was deroofed to reveal a partial thickness ulcer. He is voicing no complaints or concerns, tolerated dialysis yesterday. We will continue with same treatment plan and he will follow-up next week 03/21/18; the patient has a area on the lateral left foot which still has a small probing area. The overall surface area of the wound is better. He presented with a new ulceration on the left foot plantar fifth metatarsal head last week. The area on the right third metatarsal head appears smaller.using silver alginate to all wound areas. The patient is changing the dressing himself. He is using a Darco forefoot offloading are on the right and a healing sandal on the left 03/28/18; original wound left lateral foot. Still nowhere close to looking like a heeling surface secondary wound on the left lateral foot at the base of his question fifth metatarsal which was new last week Right third metatarsal head. We have been using silver alginate all wounds 04/04/18; the original  wound on the left lateral foot again heavy callus surrounding thick nonviable subcutaneous tissue all requiring debridement. The new wound from 2  weeks ago just near this at the base of the fifth metatarsal head still looks about the same Unfortunately there is been deterioration in the third medical head wound on the right which now probes to bone. I must say this was a superficial wound at one point in time and I really don't have a good frame of reference year. I'll have to go back to look through records about what we know about the right foot. 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 He is also been to see Dr. [name]. Somewhere in here as somebody is ordered a VASCULAR evaluation which was done on 03/22/18. On the right is anterior tibial artery was monophasic triphasic at the posterior tibial artery. On the left anterior tibial artery monophasic posterior tibial artery monophasic. ABI in the right was 1.43 on the left 1.21. TBIs on the right at 0.41 and on the left at 0.32. A vascular consult was recommended and I think has been arranged. 04/11/18; Mr. Dileonardo has not had an MRI of the right foot since 2017. Recent x-ray of the right foot done in January and February was negative. However he has had a major deterioration in the wound over the third met head. He is completed antibiotics last week at dialysis [place]. I think he is going to see vascular in follow-up. The areas on the left lateral foot and left plantar fifth metatarsal head both look satisfactory. I debrided both of these areas although the tissue here looks good. The area on the left lateral foot once probe to bone it certainly does not do that now 04/18/18; MRI of the right foot is on Thursday. He has what looks to be serosanguineous purulent drainage coming out of the wound over the right third metatarsal head today. He completed antibiotics bank and Fortaz 2 weeks ago at dialysis. He has a vascular follow-up with regards to his arterial insufficiency although I  don't exactly see when that is booked. The areas on the left foot including the lateral left foot and the plantar left fifth metatarsal head look about the same. 04/25/18-He is here in follow-up evaluation for bilateral foot ulcers. MRI obtained was negative for osteomyelitis. Wound culture was negative. We will continue with same treatment plan he'll follow-up next week 05/02/18; the right plantar foot wound over the third metatarsal head actually looks better than when I last saw this. His MRI was negative for osteomyelitis wound culture was negative. On the left plantar foot both wounds on the plantar fifth metatarsal head and on the lateral foot both are covered in a very hard circumferential callus. 05/09/18; right plantar foot wound over the third metatarsal head stable from last week. Left plantar foot wound over the fifth metatarsal head also stable but with callus around both wound areas The area on the left lateral foot had thick callus over a surface and I had some thoughts about leaving this intact however it felt boggy. We've been using silver alginate all wounds 05/16/18; since the patient was last here he was hospitalized from 5/15 through 5/19. 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 This grew MRSA. He was placed on vancomycin.MRI  of the left foot suggested "progressive" osteomyelitis and bone destruction of the cuboid and the base of the fifth metatarsal. Stable erosive changes of the base of the second metatarsal. I'm wondering if they're aware that he had surgery and debridement in the area of the underlying bone previously by Dr. [name]. In any case, He was also revascularized with an angioplasty and stenting of the left tibial peroneal trunk and angioplasty of the left posterior tibial artery. He is on vancomycin at dialysis. He has a 2 week follow-up with infectious  disease. He has vascular surgery follow-up. He was started on Plavix. 05/23/18; the patient's wound on the lateral left foot at the level of the fifth metatarsal head and the plantar wound on the plantar fifth metatarsal head both look better. The area on the right third metatarsal head still has depth and undermining. We've been using silver alginate. He is on vancomycin. He has been revascularized on the left 05/30/18; he continues on vancomycin at dialysis. Revascularized on the left. The area on the left lateral foot is just about closed. Unfortunately the area over the plantar fifth metatarsal head undermining superiorly as does the area over the right third metatarsal head. Both of these significantly deteriorated from last week. He has appointments next week with infectious disease and orthopedics I delayed putting on a cast on the left until those appointments which therefore we made we'll bring him in on Friday the 14th with the idea of a cast on the left foot 06/06/18; he continues on vancomycin at dialysis. Revascularized on the left. He arrives today with a ballotable swelling just above the area on the left lateral foot where his previous wound was. He has no pain but he is reasonably insensate. The difficult areas on the plantar left fifth metatarsal head looks stable whereas the area on the right third plantar metatarsal head about the same as last week there is undermining here although I did not unroofed this today. He required a considerable debridement of the swelling on the left lateral foot area and I unroofed an abscess with a copious amount of brown purulent material which I obtained for culture. I don't believe during his recent hospitalization he had any further imaging although I need to review this. Previous cultures from this area done in this clinic showed MRSA, I would be surprised if this is not what this is currently even though he is on vancomycin. 06/13/18; he continues  on vancomycin at dialysis however he finishes this on Sunday and then graduates the doxycycline previously prescribed by Dr. [name]. The abscess site on the lateral foot that I unroofed last time grew moderate amounts of methicillin-resistant staph aureus that is both vancomycin and tetracycline sensitive so we should be okay from that regard. He arrives today in clinic with a connection between the abscess site and the area on the lateral foot. We've been using silver alginate to all his wound areas. The right third metatarsal head wound is measuring smaller. Using silver alginate on both wound areas 06/20/18; transitioning to doxycycline prescribed by Dr. [name]. The abscess site on the lateral foot that I unroofed 2 weeks ago grew MRSA. That area has largely closed down although the lateral part of his wound on the fifth metatarsal head still probes to bone. I suspect all of this was connected. On the right third metatarsal head smaller looking wound but surrounded by nonviable tissue that once again requires debridement using silver alginate on both areas 06/27/18; on doxycycline  100 twice a day. The abscess on the lateral foot has closed down. Although he still has the wound on the left fifth metatarsal head that extends towards the lateral part of the foot. The most lateral part of this wound probes to bone Right third metatarsal head still a deep probing wound with undermining. Nevertheless I elected not to debridement this this week If everything is copious static next week on the left I'm going to attempt a total contact cast 07/04/18; he is tolerating his doxycycline. The abscess on the left lateral foot is closed down although there is still a deep wound here that probes to bone. We will use silver collagen under the total contact cast Silver collagen to the deep wound over the right third metatarsal head is well 07/11/18; he is tolerating doxycycline. The left lateral fifth plantar  metatarsal head still probes deeply but I could not probe any bone. We've been using silver collagen and this will be the second week under the total contact cast Silver collagen to deep wound over the right third metatarsal head as well 07/18/18; he is still taking doxycycline. The left lateral fifth plantar metatarsal this week probes deeply with a large amount of exposed bone. Quite a deterioration. He required extensive debridement. Specimens of bone for pathology and CNS obtained Also considerable debridement on the right third metatarsal head 07/25/18; bone for pathology last week from the lateral left foot showed osteomyelitis. CandS of the bone Enterobacter. And Klebsiella. He is now on ciprofloxacin and the doxycycline is stopped [Dr. [name] of infectious disease] area He has an appointment with Dr. [name] tomorrow I'm going to leave the total contact cast off for this week. May wish to try to reapply that next week or the week after depending on the wound bed looks. I'm not sure if there is an operative option here, previously is followed with Dr. [name] 08/01/18 on evaluation today patient presents for reevaluation. He has been seen by infectious disease, Dr. [name], and he has placed the patient back on Ceftaz currently to be given to him at dialysis three times a week. 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 Fortunately there does not appear to be the evidence of infection at this point in time. Overall the patient has been tolerating the dressing changes without complication. We are no longer due lives in the cast of left foot secondary to the infection obviously. 08/10/18 on evaluation today patient appears to be doing rather well all things considered in regard to his left plantar foot ulcer. He does have a significant callous on the lateral portion of his left foot he wonders if I can help clean that away to  some degree today. Fortunately he is having no evidence of infection at this time which is good news. No fevers chills noted. With that being said his right plantar foot actually does have a significant callous buildup around the wound opening this seems to not be doing as well as I would like at this point. I do believe that he would benefit from sharp debridement at this site. Subsequently I think a total contact cast would be helpful for the right foot as well. 08/17/18; the patient arrived today with a total contact cast on the right foot. This actually looks quite good. He had gone to see Dr. Megan Salon of infectious disease about the osteomyelitis on the left foot. I think he is on  IV Fortaz at dialysis although I am not exactly sure of the rationale for the [facility] at the time of this dictation. He also arrived in today with a swelling on the lateral left foot this is the site of his original wounds in fact when I first saw this man when he was under Dr. Ardeen Garland care I think this was the site of where the wound was located. It was not particularly tender however using a small scalpel I opened it to [place] some moderate amount of purulent drainage. 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 Last MRI was in May that showed progressive osteomyelitis and bone destruction of the cuboid and base of the fifth metatarsal. Last week he had an abscess over this same area this was removed. Culture was negative. We are continuing with a total contact cast to the area on the third metatarsal head on the right and making some good progress here. The patient asked me again about renal transplant. He is not on the list because of the open wounds. 08/31/18; apparently the patient had an interruption in the [place] but he is now back on this at dialysis. Apparently there was a  misunderstanding and Dr. Crissie Figures orders. Due to have the MRI of the left foot tonight. The area on the right foot continues to have callus thick skin and subcutaneous tissue around the wound edge that requires constant debridement however the wound is smaller we are using a total contact cast in this area. Alginate all wounds 09/07/18; without much surprise the MRI of his left foot showed osteomyelitis in the reminiscent of his fourth metatarsal but also the third metatarsal. She arrives in clinic today with the right foot and a total contact cast. There was purulent drainage coming out of the wound which I have cultured. Marked deterioration here with undermining widely around the wound orifice. Using pickups and a scalpel I remove callus and subcutaneous tissue from a substantial new opening. In a similar fashion the area on the left lateral foot that was blistered last week I have opened this and remove skin and subcutaneous tissue from this area to expose a obvious new wound. I think there is extension and communication between all of this on the left foot. The patient is on Fortaz at dialysis. Culture done of the right foot. We are clearly not making progress here. We have made him an appointment with Dr. [name] of orthopedics. I think an amputation of the left leg may be discussed. I don't think there is anything that can be done with foot salvage. 09/14/18; culture from the right foot last time showed a very resistant MRSA. This is resistant to doxycycline. I'm going to try to get linezolid at least for a week. He does not have a appointment with Dr. [name] yet. 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 I'll send a message to Dr. [name] of infectious disease. I want the patient to see Dr. [name] to go over the pros and cons of an amputation which I think will be a BKA. The patient had a question about whether this is curative or  not. I told him that I thought it would be although spread of staph aureus infection is not unheard of. MRI of the right foot was done at the end of  April 2019 did not show osteomyelitis. The patient last saw Dr. [name] on 9/3. I'll send Dr. [name] a message. I would really like him to weigh the pros and cons of a BKA on the left otherwise he'll probably need IV antibiotics and perhaps hyperbaric oxygen again. He has not had a good response to this in the past. His MRI earlier this month showed progressive damage in the remnants of the fourth and third metatarsal 09/21/2018; sees Dr. [name] of orthopedics next week to discuss a left BKA in response to the osteomyelitis in the left foot. Using silver alginate to all wound areas. He is completing the Zyvox I did put in for him after last culture showed MRSA 09/29/2018; patient saw Dr. [name] of orthopedics discussed the osteomyelitis in the left foot third and fourth metatarsals. He did not recommend urgent surgery but certainly stated the only surgical option would be a BKA. He is communicating with Dr. [name]. The problem here is the instability of the areas on his foot which constantly generate draining abscesses. I will communicate with Dr. [name] about this. He has an appointment on 10/23 10/05/2018; patient sees Dr. [name] on 10/23. I have sent him a secure message not ordered any additional antibiotics for now. Patient continues to have 2 open areas on the left plantar foot at the fifth metatarsal head and the lateral aspect of the foot. These have not changed all that much. The area on the right third met head still has thick callus and surrounding subcutaneous tissue we have been using silver alginate 10/12/2018; patient sees Dr. [name] or colleague on 10/23. 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 The area on the right plantar foot is just about closed. The superior  wound on the left fifth metatarsal head is callused over but I am not sure if this is closed. The area below it is about the same. Considerable amount of callus on the lateral foot. We have been using silver alginate to the wounds 10/19/2018; the patient saw Dr. [name] on 10/22. He wishes to try and continue to save the left foot. He has been given 6 weeks of oral ciprofloxacin. We continue to put a total contact cast on the right foot. The area over the fifth metatarsal head on the left is callused over/perhaps healed but I did not remove the callus to find out. He still has the wound on the left lateral midfoot requiring debridement. We have been using silver alginate to all wound areas 10/26/2018 On the left foot our intake nurse noted some purulent drainage from the inferior wound [currently the only one that is not callused over] On the right foot even though he is in total contact cast a considerable amount of thick black callus and surface eschar. On this side we have been using silver alginate under a total contact cast. he remains on ciprofloxacin as prescribed by Dr. [name] 11/02/2018; culture from last week which is done of the probing area on the left midfoot wound showed MRSA "few". I am going to need to contact Dr. [name] which I will do today I think he is going to need IV antibiotics again. The area on the left foot which was callused on the side is clearly separating today all of this was removed a copious amounts of callus and necrotic subcutaneous tissue. The area on the right plantar foot actually remained healthy looking with a healthy granulated base. We  are using silver alginate to all wound areas 11/09/2018; unfortunately neither 1 of the patient's wounds areas looks at all satisfactory. On the left he has considerable necrotic debris from the plantar wound laterally over the foot. I removed copious amounts of material including callus skin and subcutaneous tissue. On  the right foot he arrives with undermining and frankly purulent drainage. Specimen obtained for culture and debridement of the callused skin and subcutaneous tissue from around the circumference. Because of this I cannot put him back in a total contact cast but to be truthful we are really unfortunately not making a lot of progress I did put in a secure text message to Dr. [name] wondering about the MRSA on the left foot. He suggested vancomycin although this is not started. I was left wondering if he expected me to send this into dialysis. Has we have more purulence on the right side wait for that result before calling dialysis 11/16/2018; I called dialysis earlier this week to get the vancomycin started. I think he got the first dose on Tuesday. The patient tells me that he fell earlier this week twisting his left foot and ankle and he is swelling. He continues to not look well. He had blood cultures done at dialysis on Tuesday he is not been informed of the results. 12/07/2018; the patient was admitted to hospital from 11/22/2018 through 12/02/2018. He underwent a left BKA. Apparently had staph sepsis. In the meantime being off the right foot this is closed over. He follows up with Dr. 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 Subsequent to this he actually had multiple falls in fact he tells me to following which calls the wound to be his apparently. He has been seeing Dr. [name] and his physician assistant in the interim. They actually did want him to come back for reevaluation to see if there's anything we can do for me wound care perspective the help this area to heal more appropriately. The patient states that he does not have a tremendous amount of pain which is good news. No fevers, chills, nausea, or vomiting noted at this time. We have gotten approval from Dr. [name]  office had [place] for [place] to treat the patient for his stop wound. This is due to the fact that the patient was in the 90 day postop global. 1/21; the patient does not have an open wound on the right foot although he does have some pressure areas that will need to be padded when he is transferring. The dehisced surgical wound looks clean although there is some undermining of 1.5 cm superiorly. 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 This appears healthy but there is undermining. TheraSkin #1 2/11; TheraSkin #2 2/25; TheraSkin #3. Wound is measuring smaller 3/10; TheraSkin #4 wound is measuring smaller 3/24; TheraSkin #5 wound is measuring smaller and looks healthy. 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 He has no open area on the right foot although he does have some callused area over the original third metatarsal head wound. The third metatarsal is also subluxed on the right foot. I have warned him today that he cannot consider wearing a prosthesis for at least a month but he can go for measurements. He is going to need to keep the right foot  padded is much as possible in his diabetic shoe indefinitely READMISSION 06/12/2019 [name] is a type II diabetic on dialysis. He has been in this clinic multiple times with wounds on his bilateral lower extremities. 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 He also has a history of wounds on his right foot but tells me that everything is going well here. He actually came in here with his prosthesis walking with a cane. We were all really quite gratified to see this He states over the last several weeks he has had a painful area at the tip of the left third finger. His dialysis shunt in his is in the left upper arm and this  particularly hurts during dialysis when his fingers get numb and there is a lot of pain in the tip of his left third finger. I believe he is seen vascular surgery. He had a Doppler done to evaluate for a dialysis steal syndrome. He is going for a banding procedure 1 week from today towards the left AV fistula. I think if that is unsuccessful they will be looking at creating a new shunt. He had a course of doxycycline when he which he finished about a week ago. He has been using Bactroban to the finger 6/25; the patient had his banding procedure and things seem to be going better. He is not having pain in his hand at dialysis. The area on the tip of his finger seems about closed. He did however have a paronychia I the last time I saw him. I have been advising him to use topical Bactroban washing the finger. Culture I did last time showed a few staph epidermidis which I was willing to dismiss as superficial skin contaminant. He arrives today with the finger wound looking better however the paronychia a seems worse 7/9; 2-week follow-up. He does not have an open wound on the tip of his third left finger. I thought he had an initial ischemic wound on the tip of the finger as well as a paronychia a medially. All of this appears to be closed. 8/6-Patient returns after 3 weeks for follow-up and has a right second met head plantar callus with a significant area of maceration hiding an ulcer ABI repeated today on right - 1.26 8/13-Patient returns at 1 week, the right second met head plantar wound appears to be slightly better, it is surrounded by the callus area we are using silver alginate 8/20; the patient came back to clinic 2 weeks ago with a wound on the right second metatarsal head. He apparently told me he developed callus in this area but also went away from his custom shoes to a pair of running shoes. Using silver alginate. 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 There might  be much we can do to offload this although the patient tells me he has a wheelchair that he is using to try and stay off the wound is much as possible 8/27; right second metatarsal head. Still small punched out area with thick callus and subcutaneous tissue around the wound. We have been using silver collagen. This is always been an issue with this man's wound especially on this foot 9/3; right second met head. Still the same punched-out area with thick callus and subcutaneous tissue around the wound removing the circumference demonstrates repetitively undermining area. I  have removed all the subcutaneous tissue associated with this. We have been using silver collagen 9/15; right second metatarsal head. Same punched-out thick callus and subcutaneous tissue around the wound area. Still requiring debridement we have been using silver collagen I changed back to silver alginate X-ray last time showed no evidence of osteomyelitis. Culture showed Klebsiella and he was started and Keflex apparently just 4 days ago 9/24; right second metatarsal head. He is completed the antibiotics I gave him for 10 days. Same punched-out thick callus and subcutaneous tissue around the area. Still requiring debridement. We have been using silver alginate. The area itself looks swollen and this could be just [place] that comes on this area with walking on a prosthesis on the left however I think he needs an MRI and I am going to order that today. There is not an option to offload this further 10/1; right second plantar metatarsal head. MRI booked for October 6. Generally looking better today using silver alginate 10/8; right second plantar metatarsal head. MRI that was done on 10/6 was negative for osteomyelitis noted prior amputations of the second and fourth toes at the [facility]. Prior amputation of the third toe to the base of the middle phalanx. Prior amputation of the fifth toe to the head of the metatarsal. 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 He does not recall about hearing about this. He did not have any pain but then again he has reduced sensation from diabetic neuropathy 10/15; right second plantar metatarsal head. Still in the same condition callus nonviable subcutaneous tissue which cleans up nicely with debridement but reforms by the next week. The patient tells me that he is offloading this is much as he can including taking his wheelchair to dialysis. We have been using silver alginate, changed to silver collagen today 10/22; right second plantar metatarsal head. Wound measures smaller but still thick callus around this wound. Still requiring debridement 11/5 right second plantar metatarsal head. Less callus around the wound circumference but still requiring debridement. There is still some undermining which I cleaned out the wound looks healthy but still considerable punched out depth with a relatively small circumference to the wound there is no palpable bone no purulent drainage 11/12; right second plantar metatarsal head. Small wound with thick callus tissue around the wound and significant undermining. We have been using silver collagen after my continuous debridements of this area 11/19; patient arrives in today with purulent drainage coming out of the wound in the second third met head area on the right foot. Serosanguineous thick drainage. Specimen obtained for culture. 11/21/2019 on evaluation today patient appears to be doing well with regard to his foot ulcer. The good news is is culture came back negative for any bacteria there was no growth. The other good news is his x-ray also appears to be doing well. The foot is also measuring much better than what it was. Overall I am very pleased with how things seem to be progressing. 12/22; patient was in [facility] for several weeks due to the death of a family member. He said he  stayed off the wound using silver alginate. 01/03/2020. Patient has a small open area with roughly 0.3 cm of circumferential undermining. The orifice looks smaller. We have been using silver collagen. There is not a wound more aggressive way to offload this area as he has a prosthesis on the other leg 1/21; again the same small area is  2 weeks ago. We have been using silver collagen I change that to endoform today I really believe that this is an offloading issue 2/4; the area on the right third met head. We have been using endoform. 2/11 the area on the right third met head we have been using silver alginate. 2/25; the area on the right third met head. There is much less callus on this today. The patient states he is offloading this even more aggressively. As an example he is taking his wheelchair into dialysis on most days 3/4; right third metatarsal head. Again he has eschar over the surface of the wound which I removed with a #3 curette. Some subcutaneous debris. This has 0.8 cm of direct probing depth. I cannot feel any bone here. I did do a culture the area 3/11; right third metatarsal head. Again an eschared area over the wound which almost makes this look superficial. Again debridement reveals a wound with probing depth probably not much different from last week there is no palpable bone no palpable purulence. The patient tells me that he is making every effort to offload this area properly. He is taking wheelchair into dialysis etc. There is no option for forefoot offloading or a total contact cast. We used Oasis #1 today Electronic Signature(s) Signed: 03/08/2020 7:03:15 AM By: Linton Ham MD Entered By: Linton Ham on 03/07/2020 07:46:16 -------------------------------------------------------------------------------- Physical Exam Details Patient Name: Date of Service: [name], [name] 03/06/2020 1:30 PM Medical Record [id number] Patient Account Number:  [id number] Date of Birth/Sex: Treating RN: 06-30-1951 (69 y.o. 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 However he appears well and I think this is a chronic problem. Pulse regular and within target range for patient.. Temperature is normal and within the target range for the patient.Marland Kitchen Appears in no distress. Notes Wound exam; again he comes in today with callus subcutaneous tissue almost making the orifice look superficial. Using a #3 curette removing callus thick raised circumferential tissue you get into a probing area I think is about the same as last week. However the tissue looks healthy good granulation there is no exposed bone no purulence no erythema around the wound Electronic Signature(s) Signed: 03/08/2020 7:03:15 AM By: Linton Ham MD Entered By: Linton Ham on 03/07/2020 07:47:21 -------------------------------------------------------------------------------- Physician Orders Details Patient Name: Date of Service: [name], [name] 03/06/2020 1:30 PM Medical Record [id number] Patient Account Number: [id number] Date of Birth/Sex: Treating RN: 04-01-1951 (69 y.o. 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 - Thursday Dressing Change Frequency Wound #41 Right Metatarsal head  second Change dressing every day. Wound Cleansing May shower and wash wound with soap and water. Primary Wound Dressing Wound #41 Right Metatarsal head second Skin Substitute Application - Oasis burn matrix skin sub applied by MD. secured with adaptic and steri- strips. Leave in place. Secondary Dressing Wound #41 Right Metatarsal head second Foam - foam donut Kerlix/Rolled Gauze Dry Gauze Other: - felt to periwound and place one below wound. lightly wrap coban to  hold dressing in place. felt / pad wound Edema Control Avoid standing for long periods of time Elevate legs to the level of the heart or above for 30 minutes daily and/or when sitting, a frequency of: - throughout the day. Off-Loading Other: - felt to periwound and place one below wound. Additional Orders / Instructions Other: - minimize walking and standing on foot to aid in wound healing and callus buildup. Electronic Signature(s) Signed: 03/06/2020 6:00:33 PM By: Deon Pilling Signed: 03/08/2020 7:03:15 AM By: Linton Ham MD Entered By: Deon Pilling on 03/06/2020 14:23:29 -------------------------------------------------------------------------------- Problem List Details Patient Name: Date of Service: [name], [name] 03/06/2020 1:30 PM Medical Record [id number] Patient Account Number: [id number] Date of Birth/Sex: Treating RN: Mar 23, 1951 (69 y.o. 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, 06/12/2019 No Yes other extremity L03.115 Cellulitis of right lower  limb 11/15/2019 No Yes Inactive Problems ICD-10 Code Description Active Date Inactive Date L03.114 Cellulitis of left upper limb 06/12/2019 06/12/2019 L98.498 Non-pressure chronic ulcer of skin of other sites with other 06/12/2019 06/12/2019 specified severity S61.209D Unspecified open wound of unspecified finger without damage 06/12/2019 06/12/2019 to nail, subsequent encounter Resolved Problems Electronic Signature(s) Signed: 03/08/2020 7:03:15 AM By: Linton Ham MD Previous Signature: 03/06/2020 6:00:33 PM Version By: Deon Pilling Entered By: Linton Ham on 03/07/2020 07:44:28 -------------------------------------------------------------------------------- Progress Note Details Patient Name: Date of Service: Vickey [name] 03/06/2020 1:30 PM Medical Record [id number] Patient Account Number: [id number] Date of Birth/Sex: Treating RN: July 13, 1951 (69 y.o. [name] Primary Care Provider: [name] Other Clinician: Referring Provider: Treating Provider/Extender:Robson, Jeronimo Greaves, Buena Irish in Treatment: 38 Subjective History of Present Illness (HPI) The following HPI elements were documented for the patient's wound: Location: Patient presents with a wound to bilateral feet. Quality: Patient reports experiencing essentially no pain. Severity: Mildly severe wound with no evidence of infection Duration: Patient has had the wound for greater than 2 weeks prior to presenting for treatment The patient is a pleasant 69 yrs old bm here for evaluation of ulcers on the plantar aspect of both feet. 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 He has a left arm graft for access. He has been trying to stay off his feet for weeks but does not seem to have any improvement in the wound. He has been seeing someone at the foot center and was referred to the Wound Care center for further evaluation. 12/30/15 the patient  has 3 wounds one over the right first metatarsal head and 2 on the left foot at the left fifth and lleft first metatarsal head. All of these look relatively similar. The one over the left fifth probe to bone I could not prove that any of the others did. I note his MRI in November that did not show osteomyelitis. His peripheral pulses seem robust. All of these underwent surgical debridement to remove callus nonviable skin and subcutaneous tissue 01/06/16; the patient had his sutures removed from the right fifth ray amputation. There may be a small open part of this superiorly but otherwise the incision looks good. Areas over his right first, left first and  left fifth metatarsal head all underwent surgical debridement as a varying degree of callus, skin and nonviable subcutaneous tissue. The area that is most worrisome is the right fifth metatarsal head which has a wound probes precariously close to bone. There is no purulent drainage or erythema 01/13/16; I'm not exactly sure of the status of the right fifth ray amputation site however he follows with Dr. [name] later this week. The area over his right first plantar metatarsal head, left first and fifth plantar metatarsal head are all in the same status. Thick circumferential callus, nonviable subcutaneous tissue. Culture of the left fifth did not culture last week 01/20/16; all of the patient's wounds appear and roughly the same state although his amputation site on the right lateral foot looks better. His wounds over the right first, left first and left fifth metatarsal heads all underwent difficult surgical debridement removing circumferential callus nonviable skin and subcutaneous tissue. There is no overt evidence of infection in these areas. MRI at the end of December of the left foot did not show osteomyelitis, right foot showed osteomyelitis of the right fifth digit he is now status post amputation. 01/27/16 the patient's wounds over his plantar  first and fifth metatarsal heads on the left all appear better having been started on a total contact cast last week. They were debridement of circumferential callus and nonviable subcutaneous tissue as was the wound over the first metatarsal head. His surgical incision on the right also had some light surface debridement done. 03/23/2016 -- the patient was doing really well and most of his wounds had almost completely healed but now he came back today with a history of having a discharge from the area of his right foot on the plantar aspect and also between his first and second toe. He also has had some discharge from the left foot. Addendum: I spoke to the PA [name] at the dialysis center whose fax number is [phone]. We discussed the infection the patient has and she will put the patient on vancomycin and Fortaz until the final culture report is back. We will fax this as soon as available. 03/30/2016 -- left foot x-ray IMPRESSION:No definitive osteomyelitis noted. X-ray of the right foot -- IMPRESSION: 1. Soft tissue swelling. Prior amputation right fifth digit. No acute or focal bony abnormality identified. If osteomyelitis remains a clinical concern, MRI can be obtained. 2. Peripheral vascular disease. His culture reports have grown an MSSA -- and we will fax this report to his hemodialysis center. He was called in a prescription of oral doxycycline but I have told her not to fill this in as he is already on IV antibiotics. 04/06/2016 -- a few days ago, I spoke to the hemodialysis center nurse who had stopped the IV antibiotics and he was given a prescription for doxycycline 100 mg by mouth twice a day for a week and he is on this at the present time. 05/11/2016 -- he has recently seen his PCP this week and his hemoglobin A1c was 7. He is working on his paperwork to get his orthotic shoes. 05/25/2016 -- -- x-ray of the left foot IMPRESSION:No acute bony abnormality. No  radiographic changes of acute osteomyelitis. No change since prior study. X-ray of the right foot -- IMPRESSION: Postsurgical changes are seen involving the fifth toe. No evidence of acute osteomyelitis. he developed a large blister on the medial part of his right foot and this opened out and drain fluid. 06/01/2016 -- he has his MRI  to be done this afternoon. 06/15/2016 -- MRI of the left forefoot without contrast shows 2 separate regions of cutaneous and subcutaneous edema and possible ulceration and blistering along the ball of the foot. No obvious osteomyelitis identified. MRI of the right foot showed cutaneous and subcutaneous thickening plantar to the first digit sesamoid with an ulcer crater but no underlying osteomyelitis is identified. 06/22/2016 -- the right foot plantar ulcer has been draining a lot of seropurulent material for the last few days. 06/30/2016 -- spoke to the dialysis center and I believe I spoke to [place] a PA at the center who discussed with me and agreed to putting Legrand Como on vancomycin until his cultures arrive. On review of his culture report no WBCs were seen or no organisms were seen and the culture was reincubated for better growth. The final report is back and there were no predominant growth including Streptococcus or Staphylococcus. Clinically though he has a lot of drainage from both wounds a lot of undermining and there is further blebs on the left foot towards the interspace between his first and second toe. 08/10/2016 -- the culture from the right foot showed normal skin flora and there was no Staphylococcus aureus was group A streptococcus isolated. 09/21/2016 -- -- MRI of the right foot was done on 09/13/2016 - IMPRESSION: Findings most consistent with acute osteomyelitis throughout the great toe, sesamoid bones and plantar aspect of the head of the first metatarsal. Fluid in the sheath of the flexor tendon of the great toe could be sympathetic but is  worrisome for septic tenosynovitis. First MTP joint effusion worrisome for septic joint. He was admitted to the hospital on 09/11/2016 and treated for a fever with vancomycin and cefepime. He was seen by Dr. Bobby Rumpf of infectious disease who recommended 6 weeks treatment with vancomycin and ceftazidime with his hemodialysis and to continue to see as in the wound clinic. Vascular consult was pending. The patient was discharged home on 09/14/2016 and he would continue with IV antibiotics for 6 weeks. 09/28/16 wound appears reasonably healthy. Continuing with total contact cast 10/05/16 wound is smaller and looking healthy. Continue with total contact cast. He continues on IV antibiotics 10/12/2016 -- he has developed a new wound on the dorsal aspect of his left big toe and this is a superficial injury with no surrounding cellulitis. He has completed 30 days of IV antibiotics and is now ready to start his hyperbaric oxygen therapy as per his insurance company's recommendation. 10/19/2016 -- after the cast was removed on the right side he has got good resolution of his ulceration on the right plantar foot. he has a new wound on the left plantar foot in the region of his fourth metatarsal and this will need sharp debridement. 10/26/2016 -- Xray of the right foot complete: IMPRESSION: Changes consistent with osteomyelitis involving the head of the first metatarsal and base of the first proximal phalanx. The sesamoid bones are also likely involved given their positioning. 11/02/2016 -- there still awaiting insurance clearance for his hyperbaric oxygen therapy and hopefully he will begin treatment soon. 11/09/2016 -- started with hyperbaric oxygen therapy and had some barotrauma to the right ear and this was seen by ENT who was prescribed Afrin drops and would probably continue with HBO and he is scheduled for myringotomy tubes on Friday 11/16/2016 -- he had his myringotomy tubes placed on  Friday and has been doing much better after that with some fluid draining out after hyperbaric treatment today. The pain  was much better. 11/30/2016 -- over the last 2 days he noticed a swelling and change of color of his right second toe and this had been draining minimal fluid. 12/07/2016 -- x-ray of the right foot -- IMPRESSION:1. Progressive ulceration at the distal aspect of the second digit with significant soft tissue swelling and osseous changes in the distal phalanx compatible with osteomyelitis. 2. Chronic osteomyelitis at the first MTP joint. 3. Ulcerations at the second and third toes as well without definite osseous changes. Osteomyelitis is not excluded. 4. Fifth digit amputation. On 12/01/2016 oo I spoke to Dr. Bobby Rumpf, the infectious disease specialist, who kindly agreed to treat this with IV antibiotics and he would call in the order to the dialysis center and this has been discussed in detail with the patient who will make the appropriate arrangements. The patient will also book an appointment as soon as possible to see Dr. [name] in the office. Of note the patient has not been on antibiotics this entire week as the dialysis center did not receive any orders from Dr. [name]. I got in touch with Dr. [name] who tells me the patient has an appointment to see him this coming Wednesday. 12/14/2016 -- the patient is on ceftazidime and vancomycin during his dialysis, and I understand this was put on by his nephrologist Dr. Raliegh Ip possibly after speaking with infectious disease Dr. [name] 12/23/16; patient was on my schedule today for a wound evaluation as he had difficulties with his schedule earlier this week. I note that he is on vancomycin and ceftazidine at dialysis. In spite of this he arrives today with a new wound on the base of the right second toe this easily probes to bone. The known wound at the tip of the second toe With this area. He also has a superficial area on  the medial aspect of the third toe on the side of the DIP. This does not appear to have much depth. The area on the plantar left foot is a deep area but did not probe the bone 12/28/16 -- he has brought in some lab work and the most recent labs done showed a hemoglobin of 12.1 hematocrit of 36.3, neutrophils of 51% WBC count of 5.6, BN of 53, albumin of 4.1 globulin of 3.7, vancomycin 13 g per mL. 01/04/2017 -- he saw Dr. [name] who has recommended a amputation of the right second toe and he is awaiting this date. He sees Dr. Bobby Rumpf of infectious disease tomorrow. The bone culture taken on 12/28/2016 had no growth in 2 days. 01/11/2017 -- was seen by Dr. Bobby Rumpf regarding the management and has recommended a eval by vascular surgeons. He recommended to continue the antibiotics during his hemodialysis. Xray of the left foot -- IMPRESSION: No acute fracture, dislocation, or osseous erosion identified. 01/18/2017 -- he has his vascular workup later today and he is going to have his right foot second toe amputation this coming Friday by Dr. [name]. We have also put in for a another 30 treatments with hyperbaric oxygen therapy 01/25/2017 -- I have reviewed Dr. Donnetta Hutching is vascular report from last week where he reviewed him and thought his vascular function was good enough to heal his amputation site and no further tests were recommended. He also had his orthopedic related to surgery which is still pending the notes and we will review these next week. 02/01/2017 -- the operative remote of Dr. 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 02/08/2017 --he was seen by Dr. [name] on 02/03/2017 -- patient is on Schuyler. after a thorough review he had recommended to continue with antibiotics during hemodialysis. The patient was seen by Dr. [name], but I do not find any follow-up note on the electronic medical record. he has removed the dressing over the right foot to remove the sutures and asked him to see him back in a month's time 02/22/2017 -- patient has been febrile and has been having symptoms of the upper respiratory tract infection but has not been checked for the flu and it's been over 4 days now. He says he is feeling better today. Some drainage between his left first and second toe and this needed to be looked at 03/08/2017 -- was seen by Dr. Bobby Rumpf on 03/07/2017 after review he will stop his antibiotics and see him back in a month to see if he is feeling well. 03/15/2017 -- he has completed his course of hyperbaric oxygen therapy and is doing fine with his health otherwise. 03/22/2017 -- his nutritionist at the dialysis center has recommended a protein supplement to help build his collagen and we will prescribe this for him when he has the details 05/03/2017 -- he recently noticed the area on the plantar aspect of his fifth metatarsal head which opened out and had minimal drainage. 05/10/2017 -- -- x-ray of the left foot -- IMPRESSION:1. No convincing conventional radiographic evidence of active osteomyelitis.2. Active soft tissue ulceration at the tip of the fifth digit, and at the lateral aspect of the foot adjacent the base of the fifth metatarsal. 3. Surgical changes of prior fourth toe amputation. 4. 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 5. Small vessel atherosclerotic vascular calcifications. 6. 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 ======= Readmission after 5 weeks: 06/15/2017 -- the patient  returns after 5 weeks having had an MRI done on 05/17/2017 MRI of the left foot without contrast showed soft tissue ulcer overlying the base of the fifth metatarsal. 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 No drainable fluid collection to suggest an abscess. He was in hospital between 05/16/2017 and 05/25/2017 -- and on discharge was asked to follow-up with Dr. [name] and Dr. [name]. He was treated 2 weeks post discharge with vancomycin plus ceftriaxone with hemodialysis and oral metronidazole 500 mg 3 times a day. 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 last hemoglobin A1c was 6.7% He had a postoperative MR of the foot on 05/23/2017 -- which showed no new areas of cortical bone loss, edema or soft tissue ulceration to suggest osteomyelitis. the patient has completed his course of IV antibiotics during dialysis and is to see the infectious disease doctor tomorrow. Dr. [name] had seen him after suture removal and asked him to keep the wound with a dry dressing 06/21/2017 -- he was seen by Dr. Bobby Rumpf of infectious disease on 06/16/2017 -- he stopped his Flagyl and will see him back in 6 weeks. He was asked to follow-up with Dr. [name] and with the  wound clinic clinic. 07/05/17; bone biopsy from last time showed acute osteomyelitis. This is from the reminiscent in the left fifth metatarsal. Culture result is apparently still pending [holding for anaerobe}. From my understanding in this case this is been a progressive necrotic wound which is deteriorated markedly over the last 3 weeks since he returned here. He now has a large area of exposed bone which was biopsied and cultured last week. Dr. Graylon Good has put him on vancomycin and Fortaz during his hemodialysis and Flagyl orally. He is to see Dr. [name] next week 07/19/2017 -- the patient was reviewed by Dr. [name] of  orthopedics who reviewed the case in detail and agreed with the plan to continue with IV antibiotics, aggressive wound care and hyperbaric oxygen therapy. He would see him back in 3 weeks' time 08/09/2017 -- saw Dr. Bobby Rumpf on 08/03/2017 -he was restarted on his antibiotics for 6 weeks which included vanco, ceftaz and Flagyl. he recommended continuing his antibiotics for 6 weeks and reevaluate his completion date. He would continue with wound care and hyperbaric oxygen therapy. 08/16/2017 -- I understand he will be completing his 6 weeks of antibiotics sometime later this week. 09/19/2017 -- he has been without his wound VAC for the last week due to lack of supplies. He has been packing his wound with silver alginate. 10/04/2017 -- he has an appointment with Dr. [name] tomorrow and hence we will not apply the wound VAC after his dressing changes today. 10/11/2017 -- he was seen by Dr. Bobby Rumpf on 10/05/2017, noted that the patient was currently off antibiotics, and after thorough review he recommended he follow-up with the wound care as per plan and no further antibiotics at this point. 11/29/17; patient is arrived for the wound on his left lateral foot.Marland Kitchen He is completed IV antibiotics and 2 rounds of hyperbaric oxygen for treatment of underlying osteomyelitis. He arrives today with a surface on most of the wound area however our intake nurse noted drainage from the superior aspect. This was brought to my attention. 12/13/2017 -- the right plantar foot had a large bleb and once the bleb was opened out a large callus and subcutaneous debris was removed and he has a plantar ulcer near the fourth metatarsal head 12/28/17 on evaluation today patient appears to be doing acutely worse in regard to his left foot. The wound which has been appearing to do better as now open up more deeply there is bone palpable at the base of the wound unfortunately. He tells me that this feels "like it did  when he had osteomyelitis previously" he also noted that his second toe on the left foot appears to be doing worse and is swollen there does appear to be some fluid collected underneath. His right foot plantar ulcer appears to be doing somewhat better at this point and there really is no complication at the site currently. No fevers, chills, nausea, or vomiting noted at this time. Patient states that he normally has no pain at this site however. Today he is having significant pain. 01/03/18; culture done last week showed methicillin sensitive staph aureus and group B strep. He was on Septra however he arrives today with a fever of 101. He has a functional dialysis shunt in his left arm but has had no pain here. No cough. He still makes some urine no dysuria he did have abdominal pain nausea and diarrhea over the weekend but is not had any diarrhea since yesterday. Otherwise he has no  specific complaints 01/05/18; the patient returns today in follow-up for his presentation from 1/8. At that point he was febrile. I gave him some Levaquin adjusted for his dialysis status. He tells me the fever broke that night and it is not likely that this was actually a wound infection. He is gone on to have an MRI of the left foot; this showed interval development of an abnormal signal from the base of the third and fourth metatarsal and in the cuboid reminiscent consistent with osteomyelitis. There is no mention of the left second toe I think which was a concern when it was ordered. The patient is taking his Levaquin 01/10/18; the patient has completed his antibiotics today. The area on the left lateral foot is smaller but it still probes easily to bone. He has underlying osteomyelitis here and sees Dr. Megan Salon of infectious disease this week ooThe area on the right third plantar metatarsal head still requires debridement not much change in dimensions [facility] has a new wound on the medial tip of his right third toe  again this probes to bone. He only noticed this 2 days ago 01/17/18; the patient saw Dr. [name] last week and he is back on Vanco and Fortaz at dialysis. 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 We are previously seeing him for a wound also on the base of the fourth med head on the right. X- ray of this area did not show osteomyelitis in relation to a new wound on the tip of his right third toe. Again this probes to bone. Finally his second toe on the left which didn't really show anything on the MRI at least no report appears to have a separated cutaneous area which I think is going to come right off the tip of his toe and leave exposed bone. Whether this is infectious or ischemic I am not clear Culture I did from the third toe last week which was his new wound was negative. Plain x-ray on the right foot did not show osteomyelitis in the toes 01/24/18; the patient is going went to see Dr. [name]. He is going to have a amputation of the left second and right third toe although paradoxically the left second toe looks better than last week. We have treating a deep probing wound on the left lateral foot and the area over the third metatarsal head on the right foot. 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 Also a debridement including bone of the left lateral foot and a closure which is still sutured. This is a bit surprising. Also apparent debridement of the base of the right third toe wound. 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 I'm not sure exactly where we are in the frame of things here. The wound on the left lateral foot is still sutured also the  amputation sites of the left second and right third toe. He is been using Xeroform to the sutured areas silver alginate to the right foot 02/14/18; he continues on Rocephin at dialysis for osteomyelitis. I still don't have a good sense of where we are in the treatment duration. The wounds on  the left lateral foot had the sutures removed and this clearly still probes deeply. We debrided the area with a #5 curet. We'll use silver alginate to the wound oothe area over the right third met head also required debridement of callus skin and subcutaneous tissue and necrotic debris over the wound surface. This tunnels superiorly but I did not unroofed this today. ooThe areas on the tip of his toes have a crusted surface eschar I did not debridement this either. 02/21/18; he continues on Rocephin at dialysis for osteomyelitis. I don't have a good sense of where we are and the treatment duration. The wounds on the left lateral foot is callused over on presentation but requires debridement. The area on the right third med head plantar aspect actually is measuring smaller 02/28/18;patient is on Vanco and Fortaz at dialysis, I'm not sure why I thought this was Rocephin above unless it is been changed. I don't have a good sense of time frame here. Using silver collagen to the area on the left lateral foot and right third med head plantar foot 03/07/18; patient is completing bank and Fortaz at dialysis soon. He is using Silver collagen to the area on the left lateral foot and the right third metatarsal head. He has a smaller superficial wound distal to the left lateral foot wound. 03/14/18-he is here in follow-up evaluation for multiple ulcerations to his bilateral feet. He presents with a new ulceration to the plantar aspect of the left foot underneath the blister, this was deroofed to reveal a partial thickness ulcer. He is voicing no complaints or concerns, tolerated dialysis yesterday. We will continue with same  treatment plan and he will follow-up next week 03/21/18; the patient has a area on the lateral left foot which still has a small probing area. The overall surface area of the wound is better. He presented with a new ulceration on the left foot plantar fifth metatarsal head last week. The area on the right third metatarsal head appears smaller.using silver alginate to all wound areas. The patient is changing the dressing himself. He is using a Darco forefoot offloading are on the right and a healing sandal on the left 03/28/18; original wound left lateral foot. Still nowhere close to looking like a heeling surface oosecondary wound on the left lateral foot at the base of his question fifth metatarsal which was new last week ooRight third metatarsal head. ooWe have been using silver alginate all wounds 04/04/18; the original wound on the left lateral foot again heavy callus surrounding thick nonviable subcutaneous tissue all requiring debridement. The new wound from 2 weeks ago just near this at the base of the fifth metatarsal head still looks about the same ooUnfortunately there is been deterioration in the third medical head wound on the right which now probes to bone. I must say this was a superficial wound at one point in time and I really don't have a good frame of reference year. I'll have to go back to look through records about what we know about the right foot. 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 He is also been to see Dr. [name]. Somewhere in here as somebody is ordered a VASCULAR evaluation which was done on 03/22/18. On the right is anterior tibial artery was monophasic triphasic at the posterior tibial artery. On the left anterior tibial artery monophasic posterior tibial artery monophasic. ABI in the right was  1.43 on the left 1.21. TBIs on the right at 0.41 and on the left at  0.32. A vascular consult was recommended and I think has been arranged. 04/11/18; Mr. Folts has not had an MRI of the right foot since 2017. Recent x-ray of the right foot done in January and February was negative. However he has had a major deterioration in the wound over the third met head. He is completed antibiotics last week at dialysis [place]. I think he is going to see vascular in follow-up. The areas on the left lateral foot and left plantar fifth metatarsal head both look satisfactory. I debrided both of these areas although the tissue here looks good. The area on the left lateral foot once probe to bone it certainly does not do that now 04/18/18; MRI of the right foot is on Thursday. He has what looks to be serosanguineous purulent drainage coming out of the wound over the right third metatarsal head today. He completed antibiotics bank and Fortaz 2 weeks ago at dialysis. He has a vascular follow-up with regards to his arterial insufficiency although I don't exactly see when that is booked. The areas on the left foot including the lateral left foot and the plantar left fifth metatarsal head look about the same. 04/25/18-He is here in follow-up evaluation for bilateral foot ulcers. MRI obtained was negative for osteomyelitis. Wound culture was negative. We will continue with same treatment plan he'll follow-up next week 05/02/18; the right plantar foot wound over the third metatarsal head actually looks better than when I last saw this. His MRI was negative for osteomyelitis wound culture was negative. ooOn the left plantar foot both wounds on the plantar fifth metatarsal head and on the lateral foot both are covered in a very hard circumferential callus. 05/09/18; right plantar foot wound over the third metatarsal head stable from last week. ooLeft plantar foot wound over the fifth metatarsal head also stable but with callus around both wound areas ooThe area on the left lateral  foot had thick callus over a surface and I had some thoughts about leaving this intact however it felt boggy. ooWe've been using silver alginate all wounds 05/16/18; since the patient was last here he was hospitalized from 5/15 through 5/19. 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 This grew MRSA. He was placed on vancomycin.MRI of the left foot suggested "progressive" osteomyelitis and bone destruction of the cuboid and the base of the fifth metatarsal. Stable erosive changes of the base of the second metatarsal. I'm wondering if they're aware that he had surgery and debridement in the area of the underlying bone previously by Dr. [name]. In any case, He was also revascularized with an angioplasty and stenting of the left tibial peroneal trunk and angioplasty of the left posterior tibial artery. He is on vancomycin at dialysis. He has a 2 week follow-up with infectious disease. He has vascular surgery follow-up. He was started on Plavix. 05/23/18; the patient's wound on the lateral left foot at the level of the fifth metatarsal head and the plantar wound on the plantar fifth metatarsal head both look better. The area on the right third metatarsal head still has depth and undermining. We've been using silver alginate. He is on vancomycin. He has been revascularized on the left 05/30/18; he continues on vancomycin at dialysis. Revascularized on the left. The area on  the left lateral foot is just about closed. Unfortunately the area over the plantar fifth metatarsal head undermining superiorly as does the area over the right third metatarsal head. Both of these significantly deteriorated from last week. He has appointments next week with infectious disease and orthopedics I delayed putting on a cast on the left until those appointments which therefore we made we'll bring him in on Friday the 14th  with the idea of a cast on the left foot 06/06/18; he continues on vancomycin at dialysis. Revascularized on the left. He arrives today with a ballotable swelling just above the area on the left lateral foot where his previous wound was. He has no pain but he is reasonably insensate. The difficult areas on the plantar left fifth metatarsal head looks stable whereas the area on the right third plantar metatarsal head about the same as last week there is undermining here although I did not unroofed this today. He required a considerable debridement of the swelling on the left lateral foot area and I unroofed an abscess with a copious amount of brown purulent material which I obtained for culture. I don't believe during his recent hospitalization he had any further imaging although I need to review this. Previous cultures from this area done in this clinic showed MRSA, I would be surprised if this is not what this is currently even though he is on vancomycin. 06/13/18; he continues on vancomycin at dialysis however he finishes this on Sunday and then graduates the doxycycline previously prescribed by Dr. [name]. The abscess site on the lateral foot that I unroofed last time grew moderate amounts of methicillin-resistant staph aureus that is both vancomycin and tetracycline sensitive so we should be okay from that regard. He arrives today in clinic with a connection between the abscess site and the area on the lateral foot. We've been using silver alginate to all his wound areas. The right third metatarsal head wound is measuring smaller. Using silver alginate on both wound areas 06/20/18; transitioning to doxycycline prescribed by Dr. [name]. The abscess site on the lateral foot that I unroofed 2 weeks ago grew MRSA. That area has largely closed down although the lateral part of his wound on the fifth metatarsal head still probes to bone. I suspect all of this was connected. ooOn the right third  metatarsal head smaller looking wound but surrounded by nonviable tissue that once again requires debridement using silver alginate on both areas 06/27/18; on doxycycline 100 twice a day. The abscess on the lateral foot has closed down. Although he still has the wound on the left fifth metatarsal head that extends towards the lateral part of the foot. The most lateral part of this wound probes to bone ooRight third metatarsal head still a deep probing wound with undermining. Nevertheless I elected not to debridement this this week ooIf everything is copious static next week on the left I'm going to attempt a total contact cast 07/04/18; he is tolerating his doxycycline. The abscess on the left lateral foot is closed down although there is still a deep wound here that probes to bone. We will use silver collagen under the total contact cast Silver collagen to the deep wound over the right third metatarsal head is well 07/11/18; he is tolerating doxycycline. The left lateral fifth plantar metatarsal head still probes deeply but I could not probe any bone. We've been using silver collagen and this will be the second week under the total contact cast ooSilver collagen to  deep wound over the right third metatarsal head as well 07/18/18; he is still taking doxycycline. The left lateral fifth plantar metatarsal this week probes deeply with a large amount of exposed bone. Quite a deterioration. He required extensive debridement. Specimens of bone for pathology and CNS obtained ooAlso considerable debridement on the right third metatarsal head 07/25/18; bone for pathology last week from the lateral left foot showed osteomyelitis. CandS of the bone Enterobacter. And Klebsiella. He is now on ciprofloxacin and the doxycycline is stopped [Dr. [name] of infectious disease] area He has an appointment with Dr. [name] tomorrow I'm going to leave the total contact cast off for this week. May wish to try to reapply  that next week or the week after depending on the wound bed looks. I'm not sure if there is an operative option here, previously is followed with Dr. [name] 08/01/18 on evaluation today patient presents for reevaluation. He has been seen by infectious disease, Dr. [name], and he has placed the patient back on Ceftaz currently to be given to him at dialysis three times a week. 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 Fortunately there does not appear to be the evidence of infection at this point in time. Overall the patient has been tolerating the dressing changes without complication. We are no longer due lives in the cast of left foot secondary to the infection obviously. 08/10/18 on evaluation today patient appears to be doing rather well all things considered in regard to his left plantar foot ulcer. He does have a significant callous on the lateral portion of his left foot he wonders if I can help clean that away to some degree today. Fortunately he is having no evidence of infection at this time which is good news. No fevers chills noted. With that being said his right plantar foot actually does have a significant callous buildup around the wound opening this seems to not be doing as well as I would like at this point. I do believe that he would benefit from sharp debridement at this site. Subsequently I think a total contact cast would be helpful for the right foot as well. 08/17/18; the patient arrived today with a total contact cast on the right foot. This actually looks quite good. He had gone to see Dr. Megan Salon of infectious disease about the osteomyelitis on the left foot. I think he is on IV Fortaz at dialysis although I am not exactly sure of the rationale for the Fortaz at the time of this dictation. He also arrived in today with a swelling on the lateral left foot this is the site of his original wounds in fact  when I first saw this man when he was under Dr. Ardeen Garland care I think this was the site of where the wound was located. It was not particularly tender however using a small scalpel I opened it to [place] some moderate amount of purulent drainage. 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 Last MRI was in May that showed progressive osteomyelitis and bone destruction of the cuboid and base of the fifth metatarsal. Last week he had an abscess over this same area this was removed. Culture was negative. ooWe are continuing with a total contact cast to the area on the third metatarsal head on the right  and making some good progress here. The patient asked me again about renal transplant. He is not on the list because of the open wounds. 08/31/18; apparently the patient had an interruption in the [place] but he is now back on this at dialysis. Apparently there was a misunderstanding and Dr. Crissie Figures orders. Due to have the MRI of the left foot tonight. ooThe area on the right foot continues to have callus thick skin and subcutaneous tissue around the wound edge that requires constant debridement however the wound is smaller we are using a total contact cast in this area. Alginate all wounds 09/07/18; without much surprise the MRI of his left foot showed osteomyelitis in the reminiscent of his fourth metatarsal but also the third metatarsal. She arrives in clinic today with the right foot and a total contact cast. There was purulent drainage coming out of the wound which I have cultured. Marked deterioration here with undermining widely around the wound orifice. Using pickups and a scalpel I remove callus and subcutaneous tissue from a substantial new opening. ooIn a similar fashion the area on the left lateral foot that was blistered last week I have opened this and remove  skin and subcutaneous tissue from this area to expose a obvious new wound. I think there is extension and communication between all of this on the left foot. ooThe patient is on Fortaz at dialysis. Culture done of the right foot. We are clearly not making progress here. ooWe have made him an appointment with Dr. [name] of orthopedics. I think an amputation of the left leg may be discussed. I don't think there is anything that can be done with foot salvage. 09/14/18; culture from the right foot last time showed a very resistant MRSA. This is resistant to doxycycline. I'm going to try to get linezolid at least for a week. He does not have a appointment with Dr. [name] yet. 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 I'll send a message to Dr. [name] of infectious disease. I want the patient to see Dr. [name] to go over the pros and cons of an amputation which I think will be a BKA. The patient had a question about whether this is curative or not. I told him that I thought it would be although spread of staph aureus infection is not unheard of. MRI of the right foot was done at the end of April 2019 did not show osteomyelitis. The patient last saw Dr. [name] on 9/3. I'll send Dr. [name] a message. I would really like him to weigh the pros and cons of a BKA on the left otherwise he'll probably need IV antibiotics and perhaps hyperbaric oxygen again. He has not had a good response to this in the past. His MRI earlier this month showed progressive damage in the remnants of the fourth and third metatarsal 09/21/2018; sees Dr. [name] of orthopedics next week to discuss a left BKA in response to the osteomyelitis in the left foot. Using silver alginate to all wound areas. He is completing the Zyvox I did put in for him after last culture showed MRSA 09/29/2018; patient saw Dr. [name] of orthopedics discussed the osteomyelitis in the left foot  third and fourth metatarsals. He did not recommend urgent surgery but certainly stated the only surgical option would be a BKA. He is communicating with Dr. [name]. The problem here is the instability of the areas on his foot which  constantly generate draining abscesses. I will communicate with Dr. [name] about this. He has an appointment on 10/23 10/05/2018; patient sees Dr. [name] on 10/23. I have sent him a secure message not ordered any additional antibiotics for now. Patient continues to have 2 open areas on the left plantar foot at the fifth metatarsal head and the lateral aspect of the foot. These have not changed all that much. The area on the right third met head still has thick callus and surrounding subcutaneous tissue we have been using silver alginate 10/12/2018; patient sees Dr. [name] or colleague on 10/23. 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 The area on the right plantar foot is just about closed. The superior wound on the left fifth metatarsal head is callused over but I am not sure if this is closed. The area below it is about the same. Considerable amount of callus on the lateral foot. We have been using silver alginate to the wounds 10/19/2018; the patient saw Dr. [name] on 10/22. He wishes to try and continue to save the left foot. He has been given 6 weeks of oral ciprofloxacin. We continue to put a total contact cast on the right foot. The area over the fifth metatarsal head on the left is callused over/perhaps healed but I did not remove the callus to find out. He still has the wound on the left lateral midfoot requiring debridement. We have been using silver alginate to all wound areas 10/26/2018 ooOn the left foot our intake nurse noted some purulent drainage from the inferior wound [currently the only one that is not callused over] ooOn the right foot even though he is in total contact cast a  considerable amount of thick black callus and surface eschar. On this side we have been using silver alginate under a total contact cast. oohe remains on ciprofloxacin as prescribed by Dr. [name] 11/02/2018; culture from last week which is done of the probing area on the left midfoot wound showed MRSA "few". I am going to need to contact Dr. [name] which I will do today I think he is going to need IV antibiotics again. The area on the left foot which was callused on the side is clearly separating today all of this was removed a copious amounts of callus and necrotic subcutaneous tissue. ooThe area on the right plantar foot actually remained healthy looking with a healthy granulated base. ooWe are using silver alginate to all wound areas 11/09/2018; unfortunately neither 1 of the patient's wounds areas looks at all satisfactory. On the left he has considerable necrotic debris from the plantar wound laterally over the foot. I removed copious amounts of material including callus skin and subcutaneous tissue. On the right foot he arrives with undermining and frankly purulent drainage. Specimen obtained for culture and debridement of the callused skin and subcutaneous tissue from around the circumference. Because of this I cannot put him back in a total contact cast but to be truthful we are really unfortunately not making a lot of progress I did put in a secure text message to Dr. [name] wondering about the MRSA on the left foot. He suggested vancomycin although this is not started. I was left wondering if he expected me to send this into dialysis. Has we have more purulence on the right side wait for that result before calling dialysis 11/16/2018; I called dialysis earlier this week to get the vancomycin started. I think he  got the first dose on Tuesday. The patient tells me that he fell earlier this week twisting his left foot and ankle and he is swelling. He continues to not look well. He had  blood cultures done at dialysis on Tuesday he is not been informed of the results. 12/07/2018; the patient was admitted to hospital from 11/22/2018 through 12/02/2018. He underwent a left BKA. Apparently had staph sepsis. In the meantime being off the right foot this is closed over. He follows up with Dr. 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 Subsequent to this he actually had multiple falls in fact he tells me to following which calls the wound to be his apparently. He has been seeing Dr. [name] and his physician assistant in the interim. They actually did want him to come back for reevaluation to see if there's anything we can do for me wound care perspective the help this area to heal more appropriately. The patient states that he does not have a tremendous amount of pain which is good news. No fevers, chills, nausea, or vomiting noted at this time. We have gotten approval from Dr. [name] office had [place] for [place] to treat the patient for his stop wound. This is due to the fact that the patient was in the 90 day postop global. 1/21; the patient does not have an open wound on the right foot although he does have some pressure areas that will need to be padded when he is transferring. The dehisced surgical wound looks clean although there is some undermining of 1.5 cm superiorly. 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 This appears healthy but there is undermining. TheraSkin #1 2/11; TheraSkin #2 2/25; TheraSkin #3. Wound is measuring smaller 3/10; TheraSkin #4 wound is measuring smaller 3/24; TheraSkin #5 wound is measuring smaller and looks healthy. 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 He has no open area on the right foot although he does have some  callused area over the original third metatarsal head wound. The third metatarsal is also subluxed on the right foot. I have warned him today that he cannot consider wearing a prosthesis for at least a month but he can go for measurements. He is going to need to keep the right foot padded is much as possible in his diabetic shoe indefinitely READMISSION 06/12/2019 [name] is a type II diabetic on dialysis. He has been in this clinic multiple times with wounds on his bilateral lower extremities. 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 He also has a history of wounds on his right foot but tells me that everything is going well here. He actually came in here with his prosthesis walking with a cane. We were all really quite gratified to see this He states over the last several weeks he has had a painful area at the tip of the left third finger. His dialysis shunt in his is in the left upper arm and this particularly hurts during dialysis when his fingers get numb and there is a lot of pain in the tip of his left third finger. I believe he is seen vascular surgery. He had a Doppler done  to evaluate for a dialysis steal syndrome. He is going for a banding procedure 1 week from today towards the left AV fistula. I think if that is unsuccessful they will be looking at creating a new shunt. He had a course of doxycycline when he which he finished about a week ago. He has been using Bactroban to the finger 6/25; the patient had his banding procedure and things seem to be going better. He is not having pain in his hand at dialysis. The area on the tip of his finger seems about closed. He did however have a paronychia I the last time I saw him. I have been advising him to use topical Bactroban washing the finger. Culture I did last time showed a few staph epidermidis which I was willing to dismiss as  superficial skin contaminant. He arrives today with the finger wound looking better however the paronychia a seems worse 7/9; 2-week follow-up. He does not have an open wound on the tip of his third left finger. I thought he had an initial ischemic wound on the tip of the finger as well as a paronychia a medially. All of this appears to be closed. 8/6-Patient returns after 3 weeks for follow-up and has a right second met head plantar callus with a significant area of maceration hiding an ulcer ABI repeated today on right - 1.26 8/13-Patient returns at 1 week, the right second met head plantar wound appears to be slightly better, it is surrounded by the callus area we are using silver alginate 8/20; the patient came back to clinic 2 weeks ago with a wound on the right second metatarsal head. He apparently told me he developed callus in this area but also went away from his custom shoes to a pair of running shoes. Using silver alginate. 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 There might be much we can do to offload this although the patient tells me he has a wheelchair that he is using to try and stay off the wound is much as possible 8/27; right second metatarsal head. Still small punched out area with thick callus and subcutaneous tissue around the wound. We have been using silver collagen. This is always been an issue with this man's wound especially on this foot 9/3; right second met head. Still the same punched-out area with thick callus and subcutaneous tissue around the wound removing the circumference demonstrates repetitively undermining area. I have removed all the subcutaneous tissue associated with this. We have been using silver collagen 9/15; right second metatarsal head. Same punched-out thick callus and subcutaneous tissue around the wound area. Still requiring debridement we have been using silver collagen I changed back to silver alginate X-ray last time  showed no evidence of osteomyelitis. Culture showed Klebsiella and he was started and Keflex apparently just 4 days ago 9/24; right second metatarsal head. He is completed the antibiotics I gave him for 10 days. Same punched-out thick callus and subcutaneous tissue around the area. Still requiring debridement. We have been using silver alginate. The area itself looks swollen and this could be just [place] that comes on this area with walking on a prosthesis on the left however I think he needs an MRI and I am going to order that today. There is not an option to offload this further 10/1; right second plantar metatarsal head. MRI booked for October 6. Generally looking better today using silver alginate 10/8; right second plantar metatarsal head. MRI  that was done on 10/6 was negative for osteomyelitis noted prior amputations of the second and fourth toes at the [facility]. Prior amputation of the third toe to the base of the middle phalanx. Prior amputation of the fifth toe to the head of the metatarsal. 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 He does not recall about hearing about this. He did not have any pain but then again he has reduced sensation from diabetic neuropathy 10/15; right second plantar metatarsal head. Still in the same condition callus nonviable subcutaneous tissue which cleans up nicely with debridement but reforms by the next week. The patient tells me that he is offloading this is much as he can including taking his wheelchair to dialysis. We have been using silver alginate, changed to silver collagen today 10/22; right second plantar metatarsal head. Wound measures smaller but still thick callus around this wound. Still requiring debridement 11/5 right second plantar metatarsal head. Less callus around the wound circumference but still requiring debridement. There is still some undermining which I cleaned out the wound looks  healthy but still considerable punched out depth with a relatively small circumference to the wound there is no palpable bone no purulent drainage 11/12; right second plantar metatarsal head. Small wound with thick callus tissue around the wound and significant undermining. We have been using silver collagen after my continuous debridements of this area 11/19; patient arrives in today with purulent drainage coming out of the wound in the second third met head area on the right foot. Serosanguineous thick drainage. Specimen obtained for culture. 11/21/2019 on evaluation today patient appears to be doing well with regard to his foot ulcer. The good news is is culture came back negative for any bacteria there was no growth. The other good news is his x-ray also appears to be doing well. The foot is also measuring much better than what it was. Overall I am very pleased with how things seem to be progressing. 12/22; patient was in [facility] for several weeks due to the death of a family member. He said he stayed off the wound using silver alginate. 01/03/2020. Patient has a small open area with roughly 0.3 cm of circumferential undermining. The orifice looks smaller. We have been using silver collagen. There is not a wound more aggressive way to offload this area as he has a prosthesis on the other leg 1/21; again the same small area is 2 weeks ago. We have been using silver collagen I change that to endoform today I really believe that this is an offloading issue 2/4; the area on the right third met head. We have been using endoform. 2/11 the area on the right third met head we have been using silver alginate. 2/25; the area on the right third met head. There is much less callus on this today. The patient states he is offloading this even more aggressively. As an example he is taking his wheelchair into dialysis on most days 3/4; right third metatarsal head. Again he has eschar over the  surface of the wound which I removed with a #3 curette. Some subcutaneous debris. This has 0.8 cm of direct probing depth. I cannot feel any bone here. I did do a culture the area 3/11; right third metatarsal head. Again an eschared area over the wound which almost makes this look superficial. Again debridement reveals a wound with probing depth probably not much different from last week there is no palpable  bone no palpable purulence. The patient tells me that he is making every effort to offload this area properly. He is taking wheelchair into dialysis etc. There is no option for forefoot offloading or a total contact cast. We used Oasis #1 today Objective Constitutional Patient is hypotensive. However he appears well and I think this is a chronic problem. Pulse regular and within target range for patient.. Temperature is normal and within the target range for the patient.Marland Kitchen Appears in no distress. Vitals Time Taken: 1:32 PM, Height: 73 in, Weight: 202 lbs, BMI: 26.6, Temperature: 98.3 F, Pulse: 81 bpm, Respiratory Rate: 18 breaths/min, Blood Pressure: 73/49 mmHg, Capillary Blood Glucose: 120 mg/dl. General Notes: Wound exam; again he comes in today with callus subcutaneous tissue almost making the orifice look superficial. Using a #3 curette removing callus thick raised circumferential tissue you get into a probing area I think is about the same as last week. However the tissue looks healthy good granulation there is no exposed bone no purulence no erythema around the wound Integumentary (Hair, Skin) Wound #41 status is Open. Original cause of wound was Gradually Appeared. The wound is located on the Right Metatarsal head second. The wound measures 0.3cm length x 0.3cm width x 0.7cm depth; 0.071cm^2 area and 0.049cm^3 volume. There is Fat Layer (Subcutaneous Tissue) Exposed exposed. There is no tunneling noted, however, there is undermining starting at 12:00 and ending at 12:00 with a  maximum distance of 0.3cm. There is a medium amount of serosanguineous drainage noted. The wound margin is well defined and not attached to the wound base. There is large (67-100%) pink granulation within the wound bed. There is no necrotic tissue within the wound bed. 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 There was a Excisional Skin/Subcutaneous Tissue Debridement with a total area of 2.25 sq cm performed by Ricard Dillon., MD. With the following instrument(s): Curette to remove Viable and Non-Viable tissue/material. Material removed includes Callus, Subcutaneous Tissue, Skin: Dermis, and Fibrin/Exudate after achieving pain control using Lidocaine 4% Topical Solution. A time out was conducted at 14:10, prior to the start of the procedure. A Moderate amount of bleeding was controlled with Pressure. The procedure was tolerated well with a pain level of 0 throughout and a pain level of 0 following the procedure. Post Debridement Measurements: 0.7cm length x 0.7cm width x 0.5cm depth; 0.192cm^3 volume. Character of Wound/Ulcer Post Debridement is improved. Severity of Tissue Post Debridement is: Fat layer exposed. Post procedure Diagnosis Wound #41: Same as Pre-Procedure Pre-procedure diagnosis of Wound #41 is a Diabetic Wound/Ulcer of the Lower Extremity located on the Right Metatarsal head second. A skin graft procedure using a bioengineered skin substitute/cellular or tissue based product was performed by Ricard Dillon., MD with the following instrument(s):  Forceps and Scissors. Oasis Burn Matrix was applied and secured with Steri-Strips and adaptic. 8 sq cm of product was utilized and 2 sq cm was wasted due to wound size. Post Application, no dressing was applied. A Time Out was conducted at 14:16, prior to the start of the procedure. The procedure was tolerated well with a pain level of 0  throughout and a pain level of 0 following the procedure. Post procedure Diagnosis Wound #41: Same as Pre-Procedure . Plan Follow-up Appointments: Return Appointment in 1 week. - Thursday Dressing Change Frequency: Wound #41 Right Metatarsal head second: Change dressing every day. Wound Cleansing: May shower and wash wound with soap and water. Primary Wound Dressing: Wound #41 Right Metatarsal head second: Skin Substitute Application - Oasis burn matrix skin sub applied by MD. secured with adaptic and steri-strips. Leave in place. Secondary Dressing: Wound #41 Right Metatarsal head second: Foam - foam donut Kerlix/Rolled Gauze Dry Gauze Other: - felt to periwound and place one below wound. lightly wrap coban to hold dressing in place. felt / pad wound Edema Control: Avoid standing for long periods of time Elevate legs to the level of the heart or above for 30 minutes daily and/or when sitting, a frequency of: - throughout the day. Off-Loading: Other: - felt to periwound and place one below wound. Additional Orders / Instructions: Other: - minimize walking and standing on foot to aid in wound healing and callus buildup. 1. Oasis #1 2. I believe the patient is offloading this is best he can. 3. No obvious evidence of infection although his past history is concerning Electronic Signature(s) Signed: 03/08/2020 7:03:15 AM By: Linton Ham MD Entered By: Linton Ham on 03/07/2020 07:54:26 -------------------------------------------------------------------------------- SuperBill Details Patient Name: Date of Service: Vickey [name]  03/06/2020 Medical Record [id number] Patient Account Number: [id number] Date of Birth/Sex: Treating RN: [birth year] (69 y.o. 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, other extremity L03.115 Cellulitis of right lower limb Facility Procedures The patient participates with Medicare or their insurance follows the Medicare Facility Guidelines: CPT4 Description Modifier Quantity Code [id number] Q4103 - Dermal substitute tissue/non-human origin w metabolically active 10 elements- Oasis (Burn  Matrix)product applied per sq cm (Facility Only) The patient participates with Medicare or their insurance follows the Medicare Facility Guidelines: [id number] - SKIN SUB GRAFT FACE/NK/HF/G 1 ICD-10 Diagnosis Description L97.512 Non-pressure chronic ulcer of other part of right foot with fat layer  exposed E11.621 Type 2 diabetes mellitus with foot ulcer Physician Procedures Electronic Signature(s) Signed: 03/08/2020 7:03:15 AM By: Linton Ham MD Previous Signature: 03/06/2020 6:00:33 PM Version By: Deon Pilling Entered By: Linton Ham on 03/07/2020 07:56:08

## 2020-03-13 ENCOUNTER — Other Ambulatory Visit: Payer: Self-pay

## 2020-03-13 ENCOUNTER — Encounter (HOSPITAL_BASED_OUTPATIENT_CLINIC_OR_DEPARTMENT_OTHER): Payer: Medicare Other | Admitting: Internal Medicine

## 2020-03-13 DIAGNOSIS — E11621 Type 2 diabetes mellitus with foot ulcer: Secondary | ICD-10-CM | POA: Diagnosis not present

## 2020-03-13 NOTE — Progress Notes (Signed)
[name], [name] ([id number]) Visit Report for 03/13/2020 Cellular or Tissue Based Product Details Patient Name: Date of Service: [name], [name] 03/13/2020 12:45 PM Medical Record [name]:[id number] Patient Account Number: [id number] Date of Birth/Sex: [birth year] (68 y.o. M) Treating RN: [name] Primary Care Provider: Kristie [name] Other Clinician: Referring Provider: Treating Provider/Extender:[name], [name] in Treatment: 39 Cellular or Tissue Based Wound #41 Right Metatarsal head second Product Type Applied to: Performed By: Physician [name] Cellular or Tissue Based Oasis Burn Matrix Product Type: Level of Consciousness (Pre- Awake and Alert procedure): Pre-procedure Yes - 13:05 Verification/Time Out Taken: Location: genitalia / hands / feet / multiple digits Wound Size (sq cm): 0.01 Product Size (sq cm): 10 Waste Size (sq cm): 8 Waste Reason: wound size Amount of Product Applied (sq cm): 2 Instrument Used: Forceps, Scissors Lot #: [name] Order #: 2 Expiration Date: 08/20/2021 Fenestrated: No Reconstituted: Yes Solution Type: normal saline Solution Amount: 4m Lot #: [id number] Expiration Date: 08/26/2021 Secured: Yes Secured With: Steri-Strips, adaptic Dressing Applied: No Procedural Pain: 0 Post Procedural Pain: 0 Response to Treatment: Procedure was tolerated well Level of Consciousness Awake and Alert (Post-procedure): Post Procedure Diagnosis Same as Pre-procedure Electronic Signature(s) Signed: 03/13/2020 4:55:35 PM By: [name] PillingSigned: 03/13/2020 7:02:26 PM By: [name] Entered By: [name] Pillingon 03/13/2020 13:09:24 -------------------------------------------------------------------------------- Debridement Details Patient Name: Date of Service: [name], [name]3/18/2021 12:45 PM Medical Record [name]:[id number] Account Number: [id number] of Birth/Sex: 104/22/1952(68 y.o.  M) Treating RN: [name] PillingPrimary Care Provider: JKristie CowmanOther Clinician: Referring Provider: Treating Provider/Extender:[name], MBirdena Crandallin Treatment: 39 Debridement Performed for Wound #41 Right Metatarsal head second Assessment: Performed By: Physician RRicard Dillon, [name] Debridement Type: Debridement Severity of Tissue Pre Fat layer exposed Debridement: Level of Consciousness (Pre- Awake and Alert procedure): Pre-procedure Yes - 13:00 Verification/Time Out Taken: Start Time: 13:01 Pain Control: Lidocaine 4% Topical Solution Total Area Debrided (L x W): 2 (cm) x 2 (cm) = 4 (cm) Tissue and other material Viable, Non-Viable, Callus, Subcutaneous, Skin: Dermis , Fibrin/Exudate debrided: Level: Skin/Subcutaneous Tissue Debridement Description: Excisional Instrument: Curette Bleeding: None Hemostasis Achieved: Pressure End Time: 13:04 Procedural Pain: 0 Post Procedural Pain: 0 Response to Treatment: Procedure was tolerated well Level of Consciousness Awake and Alert (Post-procedure): Post Debridement Measurements of Total Wound Length: (cm) 0.3 Width: (cm) 0.3 Depth: (cm) 0.3 Volume: (cm) 0.021 Character of Wound/Ulcer Post Improved Debridement: Severity of Tissue Post Debridement: Fat layer exposed Post Procedure Diagnosis Same as Pre-procedure Electronic Signature(s) Signed: 03/13/2020 4:55:35 PM By: [name] PillingSigned: 03/13/2020 7:02:26 PM By: [name] Entered By: [name] Hamon 03/13/2020 13:11:45 -------------------------------------------------------------------------------- HPI Details Patient Name: Date of Service: [name], [name]3/18/2021 12:45 PM Medical Record [name]:[id number] Account Number: [id number] of Birth/Sex: Treating RN: 1Oct 27, 1952(69y.o. MHessie DienerPrimary Care Provider: JKristie CowmanOther Clinician: Referring Provider: Treating Provider/Extender:[name],  [name] EBuena Irishin Treatment: 39 History of Present Illness Location: Patient presents with a wound to bilateral feet. Quality: Patient reports experiencing essentially no pain. Severity: Mildly severe wound with no evidence of infection Duration: Patient has had the wound for greater than 2 weeks prior to presenting for treatment HPI Description: The patient is a pleasant 69yrs old bm here for evaluation of ulcers on the plantar aspect of both feet. 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 He has a left  arm graft for access. He has been trying to stay off his feet for weeks but does not seem to have any improvement in the wound. He has been seeing someone at the foot center and was referred to the Wound Care center for further evaluation. 12/30/15 the patient has 3 wounds one over the right first metatarsal head and 2 on the left foot at the left fifth and lleft first metatarsal head. All of these look relatively similar. The one over the left fifth probe to bone I could not prove that any of the others did. I note his MRI in November that did not show osteomyelitis. His peripheral pulses seem robust. All of these underwent surgical debridement to remove callus nonviable skin and subcutaneous tissue 01/06/16; the patient had his sutures removed from the right fifth ray amputation. There may be a small open part of this superiorly but otherwise the incision looks good. Areas over his right first, left first and left fifth metatarsal head all underwent surgical debridement as a varying degree of callus, skin and nonviable subcutaneous tissue. The area that is most worrisome is the right fifth metatarsal head which has a wound probes precariously close to bone. There is no purulent drainage or erythema 01/13/16; I'm not exactly sure of the status of the right fifth ray amputation site however he follows with [name] later this  week. The area over his right first plantar metatarsal head, left first and fifth plantar metatarsal head are all in the same status. Thick circumferential callus, nonviable subcutaneous tissue. Culture of the left fifth did not culture last week 01/20/16; all of the patient's wounds appear and roughly the same state although his amputation site on the right lateral foot looks better. His wounds over the right first, left first and left fifth metatarsal heads all underwent difficult surgical debridement removing circumferential callus nonviable skin and subcutaneous tissue. There is no overt evidence of infection in these areas. MRI at the end of December of the left foot did not show osteomyelitis, right foot showed osteomyelitis of the right fifth digit he is now status post amputation. 01/27/16 the patient's wounds over his plantar first and fifth metatarsal heads on the left all appear better having been started on a total contact cast last week. They were debridement of circumferential callus and nonviable subcutaneous tissue as was the wound over the first metatarsal head. His surgical incision on the right also had some light surface debridement done. 03/23/2016 -- the patient was doing really well and most of his wounds had almost completely healed but now he came back today with a history of having a discharge from the area of his right foot on the plantar aspect and also between his first and second toe. He also has had some discharge from the left foot. Addendum: I spoke to the PA [name] at the dialysis center whose fax number is [phone]. We discussed the infection the patient has and she will put the patient on vancomycin and Fortaz until the final culture report is back. We will fax this as soon as available. 03/30/2016 -- left foot x-ray IMPRESSION:No definitive osteomyelitis noted. X-ray of the right foot -- IMPRESSION: 1. Soft tissue swelling. Prior amputation right  fifth digit. No acute or focal bony abnormality identified. If osteomyelitis remains a clinical concern, MRI can be obtained. 2. Peripheral vascular disease. His culture reports have grown an MSSA -- and we will fax this report to his hemodialysis center. He was  called in a prescription of oral doxycycline but I have told her not to fill this in as he is already on IV antibiotics. 04/06/2016 -- a few days ago, I spoke to the hemodialysis center nurse who had stopped the IV antibiotics and he was given a prescription for doxycycline 100 mg by mouth twice a day for a week and he is on this at the present time. 05/11/2016 -- he has recently seen his PCP this week and his hemoglobin A1c was 7. He is working on his paperwork to get his orthotic shoes. 05/25/2016 -- -- x-ray of the left foot IMPRESSION:No acute bony abnormality. No radiographic changes of acute osteomyelitis. No change since prior study. X-ray of the right foot -- IMPRESSION: Postsurgical changes are seen involving the fifth toe. No evidence of acute osteomyelitis. he developed a large blister on the medial part of his right foot and this opened out and drain fluid. 06/01/2016 -- he has his MRI to be done this afternoon. 06/15/2016 -- MRI of the left forefoot without contrast shows 2 separate regions of cutaneous and subcutaneous edema and possible ulceration and blistering along the ball of the foot. No obvious osteomyelitis identified. MRI of the right foot showed cutaneous and subcutaneous thickening plantar to the first digit sesamoid with an ulcer crater but no underlying osteomyelitis is identified. 06/22/2016 -- the right foot plantar ulcer has been draining a lot of seropurulent material for the last few days. 06/30/2016 -- spoke to the dialysis center and I believe I spoke to [place] a PA at the center who discussed with me and agreed to putting Legrand Como on vancomycin until his cultures arrive. On review of his culture report  no WBCs were seen or no organisms were seen and the culture was reincubated for better growth. The final report is back and there were no predominant growth including Streptococcus or Staphylococcus. Clinically though he has a lot of drainage from both wounds a lot of undermining and there is further blebs on the left foot towards the interspace between his first and second toe. 08/10/2016 -- the culture from the right foot showed normal skin flora and there was no Staphylococcus aureus was group A streptococcus isolated. 09/21/2016 -- -- MRI of the right foot was done on 09/13/2016 - IMPRESSION: Findings most consistent with acute osteomyelitis throughout the great toe, sesamoid bones and plantar aspect of the head of the first metatarsal. Fluid in the sheath of the flexor tendon of the great toe could be sympathetic but is worrisome for septic tenosynovitis. First MTP joint effusion worrisome for septic joint. He was admitted to the hospital on 09/11/2016 and treated for a fever with vancomycin and cefepime. He was seen by Dr. Bobby Rumpf of infectious disease who recommended 6 weeks treatment with vancomycin and ceftazidime with his hemodialysis and to continue to see as in the wound clinic. Vascular consult was pending. The patient was discharged home on 09/14/2016 and he would continue with IV antibiotics for 6 weeks. 09/28/16 wound appears reasonably healthy. Continuing with total contact cast 10/05/16 wound is smaller and looking healthy. Continue with total contact cast. He continues on IV antibiotics 10/12/2016 -- he has developed a new wound on the dorsal aspect of his left big toe and this is a superficial injury with no surrounding cellulitis. He has completed 30 days of IV antibiotics and is now ready to start his hyperbaric oxygen therapy as per his insurance company's recommendation. 10/19/2016 -- after the cast was removed on  the right side he has got good resolution of his  ulceration on the right plantar foot. he has a new wound on the left plantar foot in the region of his fourth metatarsal and this will need sharp debridement. 10/26/2016 -- Xray of the right foot complete: IMPRESSION: Changes consistent with osteomyelitis involving the head of the first metatarsal and base of the first proximal phalanx. The sesamoid bones are also likely involved given their positioning. 11/02/2016 -- there still awaiting insurance clearance for his hyperbaric oxygen therapy and hopefully he will begin treatment soon. 11/09/2016 -- started with hyperbaric oxygen therapy and had some barotrauma to the right ear and this was seen by ENT who was prescribed Afrin drops and would probably continue with HBO and he is scheduled for myringotomy tubes on Friday 11/16/2016 -- he had his myringotomy tubes placed on Friday and has been doing much better after that with some fluid draining out after hyperbaric treatment today. The pain was much better. 11/30/2016 -- over the last 2 days he noticed a swelling and change of color of his right second toe and this had been draining minimal fluid. 12/07/2016 -- x-ray of the right foot -- IMPRESSION:1. Progressive ulceration at the distal aspect of the second digit with significant soft tissue swelling and osseous changes in the distal phalanx compatible with osteomyelitis. 2. Chronic osteomyelitis at the first MTP joint. 3. Ulcerations at the second and third toes as well without definite osseous changes. Osteomyelitis is not excluded. 4. Fifth digit amputation. On 12/01/2016 I spoke to Dr. Bobby Rumpf, the infectious disease specialist, who kindly agreed to treat this with IV antibiotics and he would call in the order to the dialysis center and this has been discussed in detail with the patient who will make the appropriate arrangements. The patient will also book an appointment as soon as possible to see Dr. [name] in the office. Of  note the patient has not been on antibiotics this entire week as the dialysis center did not receive any orders from Dr. [name]. I got in touch with Dr. [name] who tells me the patient has an appointment to see him this coming Wednesday. 12/14/2016 -- the patient is on ceftazidime and vancomycin during his dialysis, and I understand this was put on by his nephrologist Dr. Raliegh Ip possibly after speaking with infectious disease Dr. [name] 12/23/16; patient was on my schedule today for a wound evaluation as he had difficulties with his schedule earlier this week. I note that he is on vancomycin and ceftazidine at dialysis. In spite of this he arrives today with a new wound on the base of the right second toe this easily probes to bone. The known wound at the tip of the second toe With this area. He also has a superficial area on the medial aspect of the third toe on the side of the DIP. This does not appear to have much depth. The area on the plantar left foot is a deep area but did not probe the bone 12/28/16 -- he has brought in some lab work and the most recent labs done showed a hemoglobin of 12.1 hematocrit of 36.3, neutrophils of 51% WBC count of 5.6, BN of 53, albumin of 4.1 globulin of 3.7, vancomycin 13 g per mL. 01/04/2017 -- he saw [name] who has recommended a amputation of the right second toe and he is awaiting this date. He sees Dr. Bobby Rumpf of infectious disease tomorrow. The bone culture taken on 12/28/2016 had  no growth in 2 days. 01/11/2017 -- was seen by Dr. Bobby Rumpf regarding the management and has recommended a eval by vascular surgeons. He recommended to continue the antibiotics during his hemodialysis. Xray of the left foot -- IMPRESSION: No acute fracture, dislocation, or osseous erosion identified. 01/18/2017 -- he has his vascular workup later today and he is going to have his right foot second toe amputation this coming Friday by [name]. We have also put  in for a another 30 treatments with hyperbaric oxygen therapy 01/25/2017 -- I have reviewed Dr. Donnetta Hutching is vascular report from last week where he reviewed him and thought his vascular function was good enough to heal his amputation site and no further tests were recommended. He also had his orthopedic related to surgery which is still pending the notes and we will review these next week. 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 02/08/2017 --he was seen by Dr. [name] on 02/03/2017 -- patient is on [place]. after a thorough review he had recommended to continue with antibiotics during hemodialysis. The patient was seen by [name], but I do not find any follow-up note on the electronic medical record. he has removed the dressing over the right foot to remove the sutures and asked him to see him back in a month's time 02/22/2017 -- patient has been febrile and has been having symptoms of the upper respiratory tract infection but has not been checked for the flu and it's been over 4 days now. He says he is feeling better today. Some drainage between his left first and second toe and this needed to be looked at 03/08/2017 -- was seen by Dr. Bobby Rumpf on 03/07/2017 after review he will stop his antibiotics and see him back in a month to see if he is feeling well. 03/15/2017 -- he has completed his course of hyperbaric oxygen therapy and is doing fine with his health otherwise. 03/22/2017 -- his nutritionist at the dialysis center has recommended a protein supplement to help build  his collagen and we will prescribe this for him when he has the details 05/03/2017 -- he recently noticed the area on the plantar aspect of his fifth metatarsal head which opened out and had minimal drainage. 05/10/2017 -- -- x-ray of the left foot -- IMPRESSION:1. No convincing conventional radiographic evidence of active osteomyelitis.2. Active soft tissue ulceration at the tip of the fifth digit, and at the lateral aspect of the foot adjacent the base of the fifth metatarsal. 3. Surgical changes of prior fourth toe amputation. 4. 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 5. Small vessel atherosclerotic vascular calcifications. 6. 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 ======= Readmission after 5 weeks: 06/15/2017 -- the patient returns after 5 weeks having had an MRI done on 05/17/2017 MRI of the left foot without contrast showed soft tissue ulcer overlying the base of the fifth metatarsal. 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 No drainable fluid collection to suggest an abscess. He was in hospital between 05/16/2017 and 05/25/2017 --  and on discharge was asked to follow-up with [name] and Dr. [name]. He was treated 2 weeks post discharge with vancomycin plus ceftriaxone with hemodialysis and oral metronidazole 500 mg 3 times a day. 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 last hemoglobin A1c was 6.7% He had a postoperative MR of the foot on 05/23/2017 -- which showed no new areas of cortical bone loss, edema or soft tissue ulceration to suggest osteomyelitis. the patient has completed his course of IV antibiotics during dialysis and is to see the infectious disease doctor tomorrow. [name] had seen him after suture removal and asked him to keep the wound with a dry  dressing 06/21/2017 -- he was seen by Dr. Bobby Rumpf of infectious disease on 06/16/2017 -- he stopped his Flagyl and will see him back in 6 weeks. He was asked to follow-up with [name] and with the wound clinic clinic. 07/05/17; bone biopsy from last time showed acute osteomyelitis. This is from the reminiscent in the left fifth metatarsal. Culture result is apparently still pending [holding for anaerobe}. From my understanding in this case this is been a progressive necrotic wound which is deteriorated markedly over the last 3 weeks since he returned here. He now has a large area of exposed bone which was biopsied and cultured last week. Dr. Graylon Good has put him on vancomycin and Fortaz during his hemodialysis and Flagyl orally. He is to see [name] next week 07/19/2017 -- the patient was reviewed by [name] of orthopedics who reviewed the case in detail and agreed with the plan to continue with IV antibiotics, aggressive wound care and hyperbaric oxygen therapy. He would see him back in 3 weeks' time 08/09/2017 -- saw Dr. Bobby Rumpf on 08/03/2017 -he was restarted on his antibiotics for 6 weeks which included vanco, ceftaz and Flagyl. he recommended continuing his antibiotics for 6 weeks and reevaluate his completion date. He would continue with wound care and hyperbaric oxygen therapy. 08/16/2017 -- I understand he will be completing his 6 weeks of antibiotics sometime later this week. 09/19/2017 -- he has been without his wound VAC for the last week due to lack of supplies. He has been packing his wound with silver alginate. 10/04/2017 -- he has an appointment with Dr. [name] tomorrow and hence we will not apply the wound VAC after his dressing changes today. 10/11/2017 -- he was seen by Dr. Bobby Rumpf on 10/05/2017, noted that the patient was currently off antibiotics, and after thorough review he recommended he follow-up with the wound care as per plan and no  further antibiotics at this point. 11/29/17; patient is arrived for the wound on his left lateral foot.Marland Kitchen He is completed IV antibiotics and 2 rounds of hyperbaric oxygen for treatment of underlying osteomyelitis. He arrives today with a surface on most of the wound area however our intake nurse noted drainage from the superior aspect. This was brought to my attention. 12/13/2017 -- the right plantar foot had a large bleb and once the bleb was opened out a large callus and subcutaneous debris was removed and he has a plantar ulcer near the fourth metatarsal head 12/28/17 on evaluation today patient appears to be doing acutely worse in regard to his left foot. The wound which has been appearing to do better as now open up more deeply there is bone palpable at the base of the wound unfortunately. He tells me that  this feels "like it did when he had osteomyelitis previously" he also noted that his second toe on the left foot appears to be doing worse and is swollen there does appear to be some fluid collected underneath. His right foot plantar ulcer appears to be doing somewhat better at this point and there really is no complication at the site currently. No fevers, chills, nausea, or vomiting noted at this time. Patient states that he normally has no pain at this site however. Today he is having significant pain. 01/03/18; culture done last week showed methicillin sensitive staph aureus and group B strep. He was on [place] however he arrives today with a fever of 101. He has a functional dialysis shunt in his left arm but has had no pain here. No cough. He still makes some urine no dysuria he did have abdominal pain nausea and diarrhea over the weekend but is not had any diarrhea since yesterday. Otherwise he has no specific complaints 01/05/18; the patient returns today in follow-up for his presentation from 1/8. At that point he was febrile. I gave him some Levaquin adjusted for his dialysis status. He  tells me the fever broke that night and it is not likely that this was actually a wound infection. He is gone on to have an MRI of the left foot; this showed interval development of an abnormal signal from the base of the third and fourth metatarsal and in the cuboid reminiscent consistent with osteomyelitis. There is no mention of the left second toe I think which was a concern when it was ordered. The patient is taking his Levaquin 01/10/18; the patient has completed his antibiotics today. The area on the left lateral foot is smaller but it still probes easily to bone. He has underlying osteomyelitis here and sees Dr. Megan Salon of infectious disease this week The area on the right third plantar metatarsal head still requires debridement not much change in dimensions He has a new wound on the medial tip of his right third toe again this probes to bone. He only noticed this 2 days ago 01/17/18; the patient saw Dr. [name] last week and he is back on Vanco and Fortaz at dialysis. 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 We are previously seeing him for a wound also on the base of the fourth med head on the right. X- ray of this area did not show osteomyelitis in relation to a new wound on the tip of his right third toe. Again this probes to bone. Finally his second toe on the left which didn't really show anything on the MRI at least no report appears to have a separated cutaneous area which I think is going to come right off the tip of his toe and leave exposed bone. Whether this is infectious or ischemic I am not clear Culture I did from the third toe last week which was his new wound was negative. Plain x-ray on the right foot did not show osteomyelitis in the toes 01/24/18; the patient is going went to see [name]. He is going to have a amputation of the left second and  right third toe although paradoxically the left second toe looks better than last week. We have treating a deep probing wound on the left lateral foot and the area over the third metatarsal head on the right foot. 2/5/19the patient  had amputation of the left second and right third toes. Also a debridement including bone of the left lateral foot and a closure which is still sutured. This is a bit surprising. Also apparent debridement of the base of the right third toe wound. 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 I'm not sure exactly where we are in the frame of things here. The wound on the left lateral foot is still sutured also the amputation sites of the left second and right third toe. He is been using Xeroform to the sutured areas silver alginate to the right foot 02/14/18; he continues on Rocephin at dialysis for osteomyelitis. I still don't have a good sense of where we are in the treatment duration. The wounds on the left lateral foot had the sutures removed and this clearly still probes deeply. We debrided the area with a #5 curet. We'll use silver alginate to the wound the area over the right third met head also required debridement of callus skin and subcutaneous tissue and necrotic debris over the wound surface. This tunnels superiorly but I did not unroofed this today. The areas on the tip of his toes have a crusted surface eschar I did not debridement this either. 02/21/18; he continues on Rocephin at dialysis for osteomyelitis. I don't have a good sense of where we are and the treatment duration. The wounds on the left lateral foot is callused over on presentation but requires debridement. The area on the right third med head plantar aspect actually is measuring  smaller 02/28/18;patient is on Vanco and Fortaz at dialysis, I'm not sure why I thought this was Rocephin above unless it is been changed. I don't have a good sense of time frame here. Using silver collagen to the area on the left lateral foot and right third med head plantar foot 03/07/18; patient is completing bank and Fortaz at dialysis soon. He is using Silver collagen to the area on the left lateral foot and the right third metatarsal head. He has a smaller superficial wound distal to the left lateral foot wound. 03/14/18-he is here in follow-up evaluation for multiple ulcerations to his bilateral feet. He presents with a new ulceration to the plantar aspect of the left foot underneath the blister, this was deroofed to reveal a partial thickness ulcer. He is voicing no complaints or concerns, tolerated dialysis yesterday. We will continue with same treatment plan and he will follow-up next week 03/21/18; the patient has a area on the lateral left foot which still has a small probing area. The overall surface area of the wound is better. He presented with a new ulceration on the left foot plantar fifth metatarsal head last week. The area on the right third metatarsal head appears smaller.using silver alginate to all wound areas. The patient is changing the dressing himself. He is using a Darco forefoot offloading are on the right and a healing sandal on the left 03/28/18; original wound left lateral foot. Still nowhere close to looking like a heeling surface secondary wound on the left lateral foot at the base of his question fifth metatarsal which was new last week Right third metatarsal head. We have been using silver alginate all wounds 04/04/18; the original wound on the left lateral foot again heavy callus surrounding thick  nonviable subcutaneous tissue all requiring debridement. The new wound from 2 weeks ago just near this at the base of the fifth metatarsal head still looks about the  same Unfortunately there is been deterioration in the third medical head wound on the right which now probes to bone. I must say this was a superficial wound at one point in time and I really don't have a good frame of reference year. I'll have to go back to look through records about what we know about the right foot. 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 He is also been to see [name]. Somewhere in here as somebody is ordered a VASCULAR evaluation which was done on 03/22/18. On the right is anterior tibial artery was monophasic triphasic at the posterior tibial artery. On the left anterior tibial artery monophasic posterior tibial artery monophasic. ABI in the right was 1.43 on the left 1.21. TBIs on the right at 0.41 and on the left at 0.32. A vascular consult was recommended and I think has been arranged. 04/11/18; [name] has not had an MRI of the right foot since 2017. Recent x-ray of the right foot done in January and February was negative. However he has had a major deterioration in the wound over the third met head. He is completed antibiotics last week at dialysis [place]. I think he is going to see vascular in follow-up. The areas on the left lateral foot and left plantar fifth metatarsal head both look satisfactory. I debrided both of these areas although the tissue here looks good. The area on the left lateral foot once probe to bone it certainly does not do that now 04/18/18; MRI of the right foot is on Thursday. He has what looks to be serosanguineous purulent drainage coming out of the wound over the right third metatarsal head today. He completed antibiotics bank and Fortaz 2 weeks ago at dialysis. He has a vascular follow-up with regards to his arterial insufficiency although I don't exactly see when that is booked. The areas on the left foot including the lateral  left foot and the plantar left fifth metatarsal head look about the same. 04/25/18-He is here in follow-up evaluation for bilateral foot ulcers. MRI obtained was negative for osteomyelitis. Wound culture was negative. We will continue with same treatment plan he'll follow-up next week 05/02/18; the right plantar foot wound over the third metatarsal head actually looks better than when I last saw this. His MRI was negative for osteomyelitis wound culture was negative. On the left plantar foot both wounds on the plantar fifth metatarsal head and on the lateral foot both are covered in a very hard circumferential callus. 05/09/18; right plantar foot wound over the third metatarsal head stable from last week. Left plantar foot wound over the fifth metatarsal head also stable but with callus around both wound areas The area on the left lateral foot had thick callus over a surface and I had some thoughts about leaving this intact however it felt boggy. We've been using silver alginate all wounds 05/16/18; since the patient was last here he was hospitalized from 5/15 through 5/19. 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 This grew MRSA. He was placed on vancomycin.MRI of the left foot suggested "progressive" osteomyelitis and bone destruction of  the cuboid and the base of the fifth metatarsal. Stable erosive changes of the base of the second metatarsal. I'm wondering if they're aware that he had surgery and debridement in the area of the underlying bone previously by [name]. In any case, He was also revascularized with an angioplasty and stenting of the left tibial peroneal trunk and angioplasty of the left posterior tibial artery. He is on vancomycin at dialysis. He has a 2 week follow-up with infectious disease. He has vascular surgery follow-up. He was started on Plavix. 05/23/18; the patient's  wound on the lateral left foot at the level of the fifth metatarsal head and the plantar wound on the plantar fifth metatarsal head both look better. The area on the right third metatarsal head still has depth and undermining. We've been using silver alginate. He is on vancomycin. He has been revascularized on the left 05/30/18; he continues on vancomycin at dialysis. Revascularized on the left. The area on the left lateral foot is just about closed. Unfortunately the area over the plantar fifth metatarsal head undermining superiorly as does the area over the right third metatarsal head. Both of these significantly deteriorated from last week. He has appointments next week with infectious disease and orthopedics I delayed putting on a cast on the left until those appointments which therefore we made we'll bring him in on Friday the 14th with the idea of a cast on the left foot 06/06/18; he continues on vancomycin at dialysis. Revascularized on the left. He arrives today with a ballotable swelling just above the area on the left lateral foot where his previous wound was. He has no pain but he is reasonably insensate. The difficult areas on the plantar left fifth metatarsal head looks stable whereas the area on the right third plantar metatarsal head about the same as last week there is undermining here although I did not unroofed this today. He required a considerable debridement of the swelling on the left lateral foot area and I unroofed an abscess with a copious amount of brown purulent material which I obtained for culture. I don't believe during his recent hospitalization he had any further imaging although I need to review this. Previous cultures from this area done in this clinic showed MRSA, I would be surprised if this is not what this is currently even though he is on vancomycin. 06/13/18; he continues on vancomycin at dialysis however he finishes this on Sunday and then graduates  the doxycycline previously prescribed by Dr. [name]. The abscess site on the lateral foot that I unroofed last time grew moderate amounts of methicillin-resistant staph aureus that is both vancomycin and tetracycline sensitive so we should be okay from that regard. He arrives today in clinic with a connection between the abscess site and the area on the lateral foot. We've been using silver alginate to all his wound areas. The right third metatarsal head wound is measuring smaller. Using silver alginate on both wound areas 06/20/18; transitioning to doxycycline prescribed by Dr. [name]. The abscess site on the lateral foot that I unroofed 2 weeks ago grew MRSA. That area has largely closed down although the lateral part of his wound on the fifth metatarsal head still probes to bone. I suspect all of this was connected. On the right third metatarsal head smaller looking wound but surrounded by nonviable tissue that once again requires debridement using silver alginate on both areas 06/27/18; on doxycycline 100 twice a day. The abscess on the lateral foot has  closed down. Although he still has the wound on the left fifth metatarsal head that extends towards the lateral part of the foot. The most lateral part of this wound probes to bone Right third metatarsal head still a deep probing wound with undermining. Nevertheless I elected not to debridement this this week If everything is copious static next week on the left I'm going to attempt a total contact cast 07/04/18; he is tolerating his doxycycline. The abscess on the left lateral foot is closed down although there is still a deep wound here that probes to bone. We will use silver collagen under the total contact cast Silver collagen to the deep wound over the right third metatarsal head is well 07/11/18; he is tolerating doxycycline. The left lateral fifth plantar metatarsal head still probes deeply but I could not probe any bone. We've been using  silver collagen and this will be the second week under the total contact cast Silver collagen to deep wound over the right third metatarsal head as well 07/18/18; he is still taking doxycycline. The left lateral fifth plantar metatarsal this week probes deeply with a large amount of exposed bone. Quite a deterioration. He required extensive debridement. Specimens of bone for pathology and CNS obtained Also considerable debridement on the right third metatarsal head 07/25/18; bone for pathology last week from the lateral left foot showed osteomyelitis. CandS of the bone Enterobacter. And Klebsiella. He is now on ciprofloxacin and the doxycycline is stopped [Dr. [name] of infectious disease] area He has an appointment with Dr. [name] tomorrow I'm going to leave the total contact cast off for this week. May wish to try to reapply that next week or the week after depending on the wound bed looks. I'm not sure if there is an operative option here, previously is followed with [name] 08/01/18 on evaluation today patient presents for reevaluation. He has been seen by infectious disease, Dr. [name], and he has placed the patient back on Ceftaz currently to be given to him at dialysis three times a week. 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 Fortunately there does not appear to be the evidence of infection at this point in time. Overall the patient has been tolerating the dressing changes without complication. We are no longer due lives in the cast of left foot secondary to the infection obviously. 08/10/18 on evaluation today patient appears to be doing rather well all things considered in regard to his left plantar foot ulcer. He does have a significant callous on the lateral portion of his left foot he wonders if I can help clean that away to some degree today. Fortunately he is having no evidence of infection at this time  which is good news. No fevers chills noted. With that being said his right plantar foot actually does have a significant callous buildup around the wound opening this seems to not be doing as well as I would like at this point. I do believe that he would benefit from sharp debridement at this site. Subsequently I think a total contact cast would be helpful for the right foot as well. 08/17/18; the patient arrived today with a total contact cast on the right foot. This actually looks quite good. He had gone to see Dr. Megan Salon of infectious disease about the osteomyelitis on the left foot. I think he is on IV Fortaz at dialysis although I am not exactly sure of  the rationale for the Fortaz at the time of this dictation. He also arrived in today with a swelling on the lateral left foot this is the site of his original wounds in fact when I first saw this man when he was under Dr. Ardeen Garland care I think this was the site of where the wound was located. It was not particularly tender however using a small scalpel I opened it to [place] some moderate amount of purulent drainage. 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 Last MRI was in May that showed progressive osteomyelitis and bone destruction of the cuboid and base of the fifth metatarsal. Last week he had an abscess over this same area this was removed. Culture was negative. We are continuing with a total contact cast to the area on the third metatarsal head on the right and making some good progress here. The patient asked me again about renal transplant. He is not on the list because of the open wounds. 08/31/18; apparently the patient had an interruption in the Why but he is now back on this at dialysis. Apparently there was a misunderstanding and Dr. Crissie Figures orders. Due to have the MRI of the left foot tonight. The  area on the right foot continues to have callus thick skin and subcutaneous tissue around the wound edge that requires constant debridement however the wound is smaller we are using a total contact cast in this area. Alginate all wounds 09/07/18; without much surprise the MRI of his left foot showed osteomyelitis in the reminiscent of his fourth metatarsal but also the third metatarsal. She arrives in clinic today with the right foot and a total contact cast. There was purulent drainage coming out of the wound which I have cultured. Marked deterioration here with undermining widely around the wound orifice. Using pickups and a scalpel I remove callus and subcutaneous tissue from a substantial new opening. In a similar fashion the area on the left lateral foot that was blistered last week I have opened this and remove skin and subcutaneous tissue from this area to expose a obvious new wound. I think there is extension and communication between all of this on the left foot. The patient is on Fortaz at dialysis. Culture done of the right foot. We are clearly not making progress here. We have made him an appointment with [name] of orthopedics. I think an amputation of the left leg may be discussed. I don't think there is anything that can be done with foot salvage. 09/14/18; culture from the right foot last time showed a very resistant MRSA. This is resistant to doxycycline. I'm going to try to get linezolid at least for a week. He does not have a appointment with [name] yet. 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 I'll send a message to Dr. [name] of infectious disease. I want the patient to see [name] to go over the pros and cons of an amputation which I think will be a BKA. The patient had a question about whether this is curative or not. I told him that I thought it would be although spread of staph aureus infection is not  unheard of. MRI of the right foot was done at the end of April 2019 did not show osteomyelitis. The patient last saw Dr.  Hatcher on 9/3. I'll send Dr. [name] a message. I would really like him to weigh the pros and cons of a BKA on the left otherwise he'll probably need IV antibiotics and perhaps hyperbaric oxygen again. He has not had a good response to this in the past. His MRI earlier this month showed progressive damage in the remnants of the fourth and third metatarsal 09/21/2018; sees [name] of orthopedics next week to discuss a left BKA in response to the osteomyelitis in the left foot. Using silver alginate to all wound areas. He is completing the Zyvox I did put in for him after last culture showed MRSA 09/29/2018; patient saw [name] of orthopedics discussed the osteomyelitis in the left foot third and fourth metatarsals. He did not recommend urgent surgery but certainly stated the only surgical option would be a BKA. He is communicating with Dr. [name]. The problem here is the instability of the areas on his foot which constantly generate draining abscesses. I will communicate with Dr. [name] about this. He has an appointment on 10/23 10/05/2018; patient sees Dr. [name] on 10/23. I have sent him a secure message not ordered any additional antibiotics for now. Patient continues to have 2 open areas on the left plantar foot at the fifth metatarsal head and the lateral aspect of the foot. These have not changed all that much. The area on the right third met head still has thick callus and surrounding subcutaneous tissue we have been using silver alginate 10/12/2018; patient sees Dr. [name] or colleague on 10/23. 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 The area on the right plantar foot is just about closed. The superior wound on the left fifth metatarsal head is callused over but I am not sure if this is closed.  The area below it is about the same. Considerable amount of callus on the lateral foot. We have been using silver alginate to the wounds 10/19/2018; the patient saw Dr. [name] on 10/22. He wishes to try and continue to save the left foot. He has been given 6 weeks of oral ciprofloxacin. We continue to put a total contact cast on the right foot. The area over the fifth metatarsal head on the left is callused over/perhaps healed but I did not remove the callus to find out. He still has the wound on the left lateral midfoot requiring debridement. We have been using silver alginate to all wound areas 10/26/2018 On the left foot our intake nurse noted some purulent drainage from the inferior wound [currently the only one that is not callused over] On the right foot even though he is in total contact cast a considerable amount of thick black callus and surface eschar. On this side we have been using silver alginate under a total contact cast. he remains on ciprofloxacin as prescribed by Dr. [name] 11/02/2018; culture from last week which is done of the probing area on the left midfoot wound showed MRSA "few". I am going to need to contact Dr. [name] which I will do today I think he is going to need IV antibiotics again. The area on the left foot which was callused on the side is clearly separating today all of this was removed a copious amounts of callus and necrotic subcutaneous tissue. The area on the right plantar foot actually remained healthy looking with a healthy granulated base. We are using silver alginate to all wound areas 11/09/2018; unfortunately neither  1 of the patient's wounds areas looks at all satisfactory. On the left he has considerable necrotic debris from the plantar wound laterally over the foot. I removed copious amounts of material including callus skin and subcutaneous tissue. On the right foot he arrives with undermining and frankly purulent drainage. Specimen obtained  for culture and debridement of the callused skin and subcutaneous tissue from around the circumference. Because of this I cannot put him back in a total contact cast but to be truthful we are really unfortunately not making a lot of progress I did put in a secure text message to Dr. [name] wondering about the MRSA on the left foot. He suggested vancomycin although this is not started. I was left wondering if he expected me to send this into dialysis. Has we have more purulence on the right side wait for that result before calling dialysis 11/16/2018; I called dialysis earlier this week to get the vancomycin started. I think he got the first dose on Tuesday. The patient tells me that he fell earlier this week twisting his left foot and ankle and he is swelling. He continues to not look well. He had blood cultures done at dialysis on Tuesday he is not been informed of the results. 12/07/2018; the patient was admitted to hospital from 11/22/2018 through 12/02/2018. He underwent a left BKA. Apparently had staph sepsis. In the meantime being off the right foot this is closed over. 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 Subsequent to this he actually had multiple falls in fact he tells me to following which calls the wound to be his apparently. He has been seeing [name] and his physician assistant in the interim. They actually did want him to come back for reevaluation to see if there's anything we can do for me wound care perspective the help this area to heal more appropriately. The patient states that he does not have a tremendous amount of pain which is good news. No fevers, chills, nausea, or vomiting noted at this time. We have gotten approval from [name] office had [place] for [place] to treat the patient for his stop wound. This is due to  the fact that the patient was in the 90 day postop global. 1/21; the patient does not have an open wound on the right foot although he does have some pressure areas that will need to be padded when he is transferring. The dehisced surgical wound looks clean although there is some undermining of 1.5 cm superiorly. 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 This appears healthy but there is undermining. TheraSkin #1 2/11; TheraSkin #2 2/25; TheraSkin #3. Wound is measuring smaller 3/10; TheraSkin #4 wound is measuring smaller 3/24; TheraSkin #5 wound is measuring smaller and looks healthy. 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 He has no open area on the right foot although he does have some callused area over the original third metatarsal head wound. The third metatarsal is also subluxed on the right foot. I have warned him today that he cannot consider wearing a prosthesis for at least a month but he can go for measurements. He is going to need to keep the right foot padded is much as possible in his diabetic shoe indefinitely READMISSION  06/12/2019 [name] is a type II diabetic on dialysis. He has been in this clinic multiple times with wounds on his bilateral lower extremities. 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 He also has a history of wounds on his right foot but tells me that everything is going well here. He actually came in here with his prosthesis walking with a cane. We were all really quite gratified to see this He states over the last several weeks he has had a painful area at the tip of the left third finger. His dialysis shunt in his is in the left upper arm and this particularly hurts during dialysis when his fingers get numb and there is a lot of pain in the tip  of his left third finger. I believe he is seen vascular surgery. He had a Doppler done to evaluate for a dialysis steal syndrome. He is going for a banding procedure 1 week from today towards the left AV fistula. I think if that is unsuccessful they will be looking at creating a new shunt. He had a course of doxycycline when he which he finished about a week ago. He has been using Bactroban to the finger 6/25; the patient had his banding procedure and things seem to be going better. He is not having pain in his hand at dialysis. The area on the tip of his finger seems about closed. He did however have a paronychia I the last time I saw him. I have been advising him to use topical Bactroban washing the finger. Culture I did last time showed a few staph epidermidis which I was willing to dismiss as superficial skin contaminant. He arrives today with the finger wound looking better however the paronychia a seems worse 7/9; 2-week follow-up. He does not have an open wound on the tip of his third left finger. I thought he had an initial ischemic wound on the tip of the finger as well as a paronychia a medially. All of this appears to be closed. 8/6-Patient returns after 3 weeks for follow-up and has a right second met head plantar callus with a significant area of maceration hiding an ulcer ABI repeated today on right - 1.26 8/13-Patient returns at 1 week, the right second met head plantar wound appears to be slightly better, it is surrounded by the callus area we are using silver alginate 8/20; the patient came back to clinic 2 weeks ago with a wound on the right second metatarsal head. He apparently told me he developed callus in this area but also went away from his custom shoes to a pair of running shoes. Using silver alginate. 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 There might be much we can do to offload this although the patient tells me he has a wheelchair that he is  using to try and stay off the wound is much as possible 8/27; right second metatarsal head. Still small punched out area with thick callus and subcutaneous tissue around the wound. We have been using silver collagen. This is always been an issue with this man's wound especially on this foot 9/3; right second met head. Still the same punched-out area with thick callus and subcutaneous tissue around the wound removing the circumference demonstrates repetitively undermining area. I have removed all the subcutaneous tissue associated with this. We have  been using silver collagen 9/15; right second metatarsal head. Same punched-out thick callus and subcutaneous tissue around the wound area. Still requiring debridement we have been using silver collagen I changed back to silver alginate X-ray last time showed no evidence of osteomyelitis. Culture showed Klebsiella and he was started and Keflex apparently just 4 days ago 9/24; right second metatarsal head. He is completed the antibiotics I gave him for 10 days. Same punched-out thick callus and subcutaneous tissue around the area. Still requiring debridement. We have been using silver alginate. The area itself looks swollen and this could be just [place] that comes on this area with walking on a prosthesis on the left however I think he needs an MRI and I am going to order that today. There is not an option to offload this further 10/1; right second plantar metatarsal head. MRI booked for October 6. Generally looking better today using silver alginate 10/8; right second plantar metatarsal head. MRI that was done on 10/6 was negative for osteomyelitis noted prior amputations of the second and fourth toes at the MTP. Prior amputation of the third toe to the base of the middle phalanx. Prior amputation of the fifth toe to the head of the metatarsal. 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 He does not  recall about hearing about this. He did not have any pain but then again he has reduced sensation from diabetic neuropathy 10/15; right second plantar metatarsal head. Still in the same condition callus nonviable subcutaneous tissue which cleans up nicely with debridement but reforms by the next week. The patient tells me that he is offloading this is much as he can including taking his wheelchair to dialysis. We have been using silver alginate, changed to silver collagen today 10/22; right second plantar metatarsal head. Wound measures smaller but still thick callus around this wound. Still requiring debridement 11/5 right second plantar metatarsal head. Less callus around the wound circumference but still requiring debridement. There is still some undermining which I cleaned out the wound looks healthy but still considerable punched out depth with a relatively small circumference to the wound there is no palpable bone no purulent drainage 11/12; right second plantar metatarsal head. Small wound with thick callus tissue around the wound and significant undermining. We have been using silver collagen after my continuous debridements of this area 11/19; patient arrives in today with purulent drainage coming out of the wound in the second third met head area on the right foot. Serosanguineous thick drainage. Specimen obtained for culture. 11/21/2019 on evaluation today patient appears to be doing well with regard to his foot ulcer. The good news is is culture came back negative for any bacteria there was no growth. The other good news is his x-ray also appears to be doing well. The foot is also measuring much better than what it was. Overall I am very pleased with how things seem to be progressing. 12/22; patient was in [facility] for several weeks due to the death of a family member. He said he stayed off the wound using silver alginate. 01/03/2020. Patient has a small open area with roughly  0.3 cm of circumferential undermining. The orifice looks smaller. We have been using silver collagen. There is not a wound more aggressive way to offload this area as he has a prosthesis on the other leg 1/21; again the same small area is 2 weeks ago. We have been using silver collagen I change  that to endoform today I really believe that this is an offloading issue 2/4; the area on the right third met head. We have been using endoform. 2/11 the area on the right third met head we have been using silver alginate. 2/25; the area on the right third met head. There is much less callus on this today. The patient states he is offloading this even more aggressively. As an example he is taking his wheelchair into dialysis on most days 3/4; right third metatarsal head. Again he has eschar over the surface of the wound which I removed with a #3 curette. Some subcutaneous debris. This has 0.8 cm of direct probing depth. I cannot feel any bone here. I did do a culture the area 3/11; right third metatarsal head. Again an eschared area over the wound which almost makes this look superficial. Again debridement reveals a wound with probing depth probably not much different from last week there is no palpable bone no palpable purulence. The patient tells me that he is making every effort to offload this area properly. He is taking wheelchair into dialysis etc. There is no option for forefoot offloading or a total contact cast. We used Oasis #1 today 3/18; again callus and eschar over the wound surface. I remove this but still a probing wound although only 3 mm in depth this time. This is an Careers information officer) Signed: 03/13/2020 7:02:26 PM By: Linton Ham [name] Entered By: Linton [name] on 03/13/2020 13:12:36 -------------------------------------------------------------------------------- Physical Exam Details Patient Name: Date of Service: [name], [name] 03/13/2020 12:45 PM Medical  Record [id number] Patient Account Number: [id number] Date of Birth/Sex: Treating RN: [birth year] (69 y.o. [name] Primary Care Provider: Kristie [name] Other Clinician: Referring Provider: Treating Provider/Extender:Quintarius Ferns, [name], [name] in Treatment: 39 Notes Wound exam again he comes in with callus and subcutaneous debris over the surface after removing it this is not closed. There is still a probing area however this is only 3 mm in depth and it looks better than last week Electronic Signature(s) Signed: 03/13/2020 7:02:26 PM By: Linton Ham [name] Entered By: Linton [name] on 03/13/2020 13:13:13 -------------------------------------------------------------------------------- Physician Orders Details Patient Name: Date of Service: [name], [name] 03/13/2020 12:45 PM Medical Record [id number] Patient Account Number: [id number] Date of Birth/Sex: Treating RN: [birth year] (69 y.o. 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 - Thursday Dressing Change Frequency Wound #41 Right Metatarsal head second Change dressing every day. Wound Cleansing May shower and wash wound with soap and water. Primary Wound Dressing Wound #41 Right Metatarsal head second Skin Substitute Application - Oasis burn matrix skin sub #2 applied by [name]. secured with adaptic and steri- strips. Leave in place. Secondary Dressing Wound #41 Right Metatarsal head  second Foam - foam donut Kerlix/Rolled Gauze Dry Gauze Other: - felt to periwound and place one below wound. lightly wrap coban to hold dressing in place. felt / pad wound Edema Control Avoid standing for long periods of time Elevate legs to the level of the heart or above for 30 minutes daily and/or when sitting, a frequency of: - throughout  the day. Off-Loading Other: - felt to periwound and place one below wound. Additional Orders / Instructions Other: - minimize walking and standing on foot to aid in wound healing and callus buildup. Electronic Signature(s) Signed: 03/13/2020 4:55:35 PM By: [name] Signed: 03/13/2020 7:02:26 PM By: Linton Ham [name] Entered By: [name] on 03/13/2020 13:11:41 -------------------------------------------------------------------------------- Problem List Details Patient Name: Date of Service: [name], [name] 03/13/2020 12:45 PM Medical Record [id number] Patient Account Number: [id number] Date of Birth/Sex: Treating RN: 07-28-1951 (69 y.o. 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, 06/12/2019 No Yes other extremity L03.115 Cellulitis of right lower limb 11/15/2019 No Yes Inactive Problems ICD-10 Code Description Active Date Inactive Date L03.114 Cellulitis of left upper limb 06/12/2019 06/12/2019 L98.498 Non-pressure chronic ulcer of skin of other sites with other 06/12/2019 06/12/2019 specified severity S61.209D Unspecified open wound of unspecified finger without damage 06/12/2019  06/12/2019 to nail, subsequent encounter Resolved Problems Electronic Signature(s) Signed: 03/13/2020 7:02:26 PM By: Linton Ham [name] Entered By: Linton [name] on 03/13/2020 13:11:11 -------------------------------------------------------------------------------- Progress Note Details Patient Name: Date of Service: [name] 03/13/2020 12:45 PM Medical Record [id number] Patient Account Number: [id number] Date of Birth/Sex: Treating RN: 08/23/1951 (69 y.o. [name] Primary Care Provider: Kristie [name] Other Clinician: Referring Provider: Treating Provider/Extender:Agam Davenport, [name], [name] in Treatment: 39 Subjective History of Present Illness (HPI) The following HPI elements were documented for the patient's wound: Location: Patient presents with a wound to bilateral feet. Quality: Patient reports experiencing essentially no pain. Severity: Mildly severe wound with no evidence of infection Duration: Patient has had the wound for greater than 2 weeks prior to presenting for treatment The patient is a pleasant 69 yrs old bm here for evaluation of ulcers on the plantar aspect of both feet. 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 He has a left arm graft for access. He has been trying to stay off his feet for weeks but does not seem to have any improvement in the wound. He has been seeing someone at the foot center and was referred to the Wound Care center for further evaluation. 12/30/15 the patient has 3 wounds one over the right first metatarsal head and 2 on the left foot at the left fifth and lleft first metatarsal head. All of these look relatively similar. The one over the left fifth probe to bone I could not prove that any of the others did. I note his MRI in November that did not show osteomyelitis. His peripheral pulses seem robust. All of these underwent surgical debridement to remove callus  nonviable skin and subcutaneous tissue 01/06/16; the patient had his sutures removed from the right fifth ray amputation. There may be a small open part of this superiorly but otherwise the incision looks good. Areas over his right first, left first and left fifth metatarsal head all underwent surgical debridement as a varying degree of callus, skin and nonviable subcutaneous tissue. The area that is most worrisome is the right fifth metatarsal head which has a wound probes precariously close to bone. There is no purulent drainage or erythema 01/13/16;  I'm not exactly sure of the status of the right fifth ray amputation site however he follows with [name] later this week. The area over his right first plantar metatarsal head, left first and fifth plantar metatarsal head are all in the same status. Thick circumferential callus, nonviable subcutaneous tissue. Culture of the left fifth did not culture last week 01/20/16; all of the patient's wounds appear and roughly the same state although his amputation site on the right lateral foot looks better. His wounds over the right first, left first and left fifth metatarsal heads all underwent difficult surgical debridement removing circumferential callus nonviable skin and subcutaneous tissue. There is no overt evidence of infection in these areas. MRI at the end of December of the left foot did not show osteomyelitis, right foot showed osteomyelitis of the right fifth digit he is now status post amputation. 01/27/16 the patient's wounds over his plantar first and fifth metatarsal heads on the left all appear better having been started on a total contact cast last week. They were debridement of circumferential callus and nonviable subcutaneous tissue as was the wound over the first metatarsal head. His surgical incision on the right also had some light surface debridement done. 03/23/2016 -- the patient was doing really well and most of his wounds had  almost completely healed but now he came back today with a history of having a discharge from the area of his right foot on the plantar aspect and also between his first and second toe. He also has had some discharge from the left foot. Addendum: I spoke to the PA [name] at the dialysis center whose fax number is [phone]. We discussed the infection the patient has and she will put the patient on vancomycin and Fortaz until the final culture report is back. We will fax this as soon as available. 03/30/2016 -- left foot x-ray IMPRESSION:No definitive osteomyelitis noted. X-ray of the right foot -- IMPRESSION: 1. Soft tissue swelling. Prior amputation right fifth digit. No acute or focal bony abnormality identified. If osteomyelitis remains a clinical concern, MRI can be obtained. 2. Peripheral vascular disease. His culture reports have grown an MSSA -- and we will fax this report to his hemodialysis center. He was called in a prescription of oral doxycycline but I have told her not to fill this in as he is already on IV antibiotics. 04/06/2016 -- a few days ago, I spoke to the hemodialysis center nurse who had stopped the IV antibiotics and he was given a prescription for doxycycline 100 mg by mouth twice a day for a week and he is on this at the present time. 05/11/2016 -- he has recently seen his PCP this week and his hemoglobin A1c was 7. He is working on his paperwork to get his orthotic shoes. 05/25/2016 -- -- x-ray of the left foot IMPRESSION:No acute bony abnormality. No radiographic changes of acute osteomyelitis. No change since prior study. X-ray of the right foot -- IMPRESSION: Postsurgical changes are seen involving the fifth toe. No evidence of acute osteomyelitis. he developed a large blister on the medial part of his right foot and this opened out and drain fluid. 06/01/2016 -- he has his MRI to be done this afternoon. 06/15/2016 -- MRI of the left forefoot  without contrast shows 2 separate regions of cutaneous and subcutaneous edema and possible ulceration and blistering along the ball of the foot. No obvious osteomyelitis identified. MRI of the right foot showed cutaneous and subcutaneous thickening  plantar to the first digit sesamoid with an ulcer crater but no underlying osteomyelitis is identified. 06/22/2016 -- the right foot plantar ulcer has been draining a lot of seropurulent material for the last few days. 06/30/2016 -- spoke to the dialysis center and I believe I spoke to Vander a PA at the center who discussed with me and agreed to putting Legrand Como on vancomycin until his cultures arrive. On review of his culture report no WBCs were seen or no organisms were seen and the culture was reincubated for better growth. The final report is back and there were no predominant growth including Streptococcus or Staphylococcus. Clinically though he has a lot of drainage from both wounds a lot of undermining and there is further blebs on the left foot towards the interspace between his first and second toe. 08/10/2016 -- the culture from the right foot showed normal skin flora and there was no Staphylococcus aureus was group A streptococcus isolated. 09/21/2016 -- -- MRI of the right foot was done on 09/13/2016 - IMPRESSION: Findings most consistent with acute osteomyelitis throughout the great toe, sesamoid bones and plantar aspect of the head of the first metatarsal. Fluid in the sheath of the flexor tendon of the great toe could be sympathetic but is worrisome for septic tenosynovitis. First MTP joint effusion worrisome for septic joint. He was admitted to the hospital on 09/11/2016 and treated for a fever with vancomycin and cefepime. He was seen by Dr. Bobby Rumpf of infectious disease who recommended 6 weeks treatment with vancomycin and ceftazidime with his hemodialysis and to continue to see as in the wound clinic. Vascular consult was  pending. The patient was discharged home on 09/14/2016 and he would continue with IV antibiotics for 6 weeks. 09/28/16 wound appears reasonably healthy. Continuing with total contact cast 10/05/16 wound is smaller and looking healthy. Continue with total contact cast. He continues on IV antibiotics 10/12/2016 -- he has developed a new wound on the dorsal aspect of his left big toe and this is a superficial injury with no surrounding cellulitis. He has completed 30 days of IV antibiotics and is now ready to start his hyperbaric oxygen therapy as per his insurance company's recommendation. 10/19/2016 -- after the cast was removed on the right side he has got good resolution of his ulceration on the right plantar foot. he has a new wound on the left plantar foot in the region of his fourth metatarsal and this will need sharp debridement. 10/26/2016 -- Xray of the right foot complete: IMPRESSION: Changes consistent with osteomyelitis involving the head of the first metatarsal and base of the first proximal phalanx. The sesamoid bones are also likely involved given their positioning. 11/02/2016 -- there still awaiting insurance clearance for his hyperbaric oxygen therapy and hopefully he will begin treatment soon. 11/09/2016 -- started with hyperbaric oxygen therapy and had some barotrauma to the right ear and this was seen by ENT who was prescribed Afrin drops and would probably continue with HBO and he is scheduled for myringotomy tubes on Friday 11/16/2016 -- he had his myringotomy tubes placed on Friday and has been doing much better after that with some fluid draining out after hyperbaric treatment today. The pain was much better. 11/30/2016 -- over the last 2 days he noticed a swelling and change of color of his right second toe and this had been draining minimal fluid. 12/07/2016 -- x-ray of the right foot -- IMPRESSION:1. Progressive ulceration at the distal aspect of the second digit  with  significant soft tissue swelling and osseous changes in the distal phalanx compatible with osteomyelitis. 2. Chronic osteomyelitis at the first MTP joint. 3. Ulcerations at the second and third toes as well without definite osseous changes. Osteomyelitis is not excluded. 4. Fifth digit amputation. On 12/01/2016 oo I spoke to Dr. Bobby Rumpf, the infectious disease specialist, who kindly agreed to treat this with IV antibiotics and he would call in the order to the dialysis center and this has been discussed in detail with the patient who will make the appropriate arrangements. The patient will also book an appointment as soon as possible to see Dr. [name] in the office. Of note the patient has not been on antibiotics this entire week as the dialysis center did not receive any orders from Dr. [name]. I got in touch with Dr. [name] who tells me the patient has an appointment to see him this coming Wednesday. 12/14/2016 -- the patient is on ceftazidime and vancomycin during his dialysis, and I understand this was put on by his nephrologist Dr. Raliegh Ip possibly after speaking with infectious disease Dr. [name] 12/23/16; patient was on my schedule today for a wound evaluation as he had difficulties with his schedule earlier this week. I note that he is on vancomycin and ceftazidine at dialysis. In spite of this he arrives today with a new wound on the base of the right second toe this easily probes to bone. The known wound at the tip of the second toe With this area. He also has a superficial area on the medial aspect of the third toe on the side of the DIP. This does not appear to have much depth. The area on the plantar left foot is a deep area but did not probe the bone 12/28/16 -- he has brought in some lab work and the most recent labs done showed a hemoglobin of 12.1 hematocrit of 36.3, neutrophils of 51% WBC count of 5.6, BN of 53, albumin of 4.1 globulin of 3.7, vancomycin 13 g per  mL. 01/04/2017 -- he saw [name] who has recommended a amputation of the right second toe and he is awaiting this date. He sees Dr. Bobby Rumpf of infectious disease tomorrow. The bone culture taken on 12/28/2016 had no growth in 2 days. 01/11/2017 -- was seen by Dr. Bobby Rumpf regarding the management and has recommended a eval by vascular surgeons. He recommended to continue the antibiotics during his hemodialysis. Xray of the left foot -- IMPRESSION: No acute fracture, dislocation, or osseous erosion identified. 01/18/2017 -- he has his vascular workup later today and he is going to have his right foot second toe amputation this coming Friday by [name]. We have also put in for a another 30 treatments with hyperbaric oxygen therapy 01/25/2017 -- I have reviewed Dr. Donnetta Hutching is vascular report from last week where he reviewed him and thought his vascular function was good enough to heal his amputation site and no further tests were recommended. He also had his orthopedic related to surgery which is still pending the notes and we will review these next week. 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 02/08/2017 --he was seen by Dr. [name] on 02/03/2017 -- patient is on [place]. after a thorough review he had recommended to continue with antibiotics during hemodialysis. The patient was seen by [name], but I do not find any follow-up note on the electronic medical record. he has removed the dressing over the right foot to remove the sutures and asked him to see him back in a  month's time 02/22/2017 -- patient has been febrile and has been having symptoms of the upper respiratory tract infection but has not been checked for the flu and it's been over 4 days now. He says he is feeling better today. Some drainage between his left first and second toe and this needed to be looked at 03/08/2017 -- was seen by Dr. Bobby Rumpf on 03/07/2017 after review he will stop his antibiotics and see him back in a month to see if he is feeling well. 03/15/2017 -- he has completed his course of hyperbaric oxygen therapy and is doing fine with his health otherwise. 03/22/2017 -- his nutritionist at the dialysis center has recommended a protein supplement to help build his collagen and we will prescribe this for him when he has the details 05/03/2017 -- he recently noticed the area on the plantar aspect of his fifth metatarsal head which opened out and had minimal drainage. 05/10/2017 -- -- x-ray of the left foot -- IMPRESSION:1. No convincing conventional radiographic evidence of active osteomyelitis.2. Active soft tissue ulceration at the tip of the fifth digit, and at the lateral aspect of the foot adjacent the base of the fifth metatarsal. 3. Surgical changes of prior fourth toe amputation. 4. 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 5. Small vessel atherosclerotic vascular calcifications. 6. 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 ======= Readmission after 5 weeks: 06/15/2017 -- the patient returns after 5 weeks having had an MRI done on 05/17/2017 MRI of the left foot without contrast showed soft tissue ulcer overlying the base of the fifth metatarsal. 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 No drainable fluid collection to suggest an abscess. He was in hospital between 05/16/2017 and 05/25/2017 -- and on discharge was asked to follow-up with Dr.  Berenice [name] and Dr. [name]. He was treated 2 weeks post discharge with vancomycin plus ceftriaxone with hemodialysis and oral metronidazole 500 mg 3 times a day. 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 last hemoglobin A1c was 6.7% He had a postoperative MR of the foot on 05/23/2017 -- which showed no new areas of cortical bone loss, edema or soft tissue ulceration to suggest osteomyelitis. the patient has completed his course of IV antibiotics during dialysis and is to see the infectious disease doctor tomorrow. [name] had seen him after suture removal and asked him to keep the wound with a dry dressing 06/21/2017 -- he was seen by Dr. Bobby Rumpf of infectious disease on 06/16/2017 -- he stopped his Flagyl and will see him back in 6 weeks. He was asked to follow-up with [name] and with the wound clinic clinic. 07/05/17; bone biopsy from last time showed acute osteomyelitis. This is from the reminiscent in the left fifth metatarsal. Culture result is apparently still pending [holding for anaerobe}. From my understanding in this case this is been a progressive necrotic wound which is deteriorated markedly  over the last 3 weeks since he returned here. He now has a large area of exposed bone which was biopsied and cultured last week. Dr. Graylon Good has put him on vancomycin and Fortaz during his hemodialysis and Flagyl orally. He is to see [name] next week 07/19/2017 -- the patient was reviewed by [name] of orthopedics who reviewed the case in detail and agreed with the plan to continue with IV antibiotics, aggressive wound care and hyperbaric oxygen therapy. He would see him back in 3 weeks' time 08/09/2017 -- saw Dr. Bobby Rumpf on 08/03/2017 -he was restarted on his antibiotics for 6 weeks which included vanco, ceftaz and Flagyl. he recommended continuing his antibiotics for 6 weeks and  reevaluate his completion date. He would continue with wound care and hyperbaric oxygen therapy. 08/16/2017 -- I understand he will be completing his 6 weeks of antibiotics sometime later this week. 09/19/2017 -- he has been without his wound VAC for the last week due to lack of supplies. He has been packing his wound with silver alginate. 10/04/2017 -- he has an appointment with Dr. [name] tomorrow and hence we will not apply the wound VAC after his dressing changes today. 10/11/2017 -- he was seen by Dr. Bobby Rumpf on 10/05/2017, noted that the patient was currently off antibiotics, and after thorough review he recommended he follow-up with the wound care as per plan and no further antibiotics at this point. 11/29/17; patient is arrived for the wound on his left lateral foot.Marland Kitchen He is completed IV antibiotics and 2 rounds of hyperbaric oxygen for treatment of underlying osteomyelitis. He arrives today with a surface on most of the wound area however our intake nurse noted drainage from the superior aspect. This was brought to my attention. 12/13/2017 -- the right plantar foot had a large bleb and once the bleb was opened out a large callus and subcutaneous debris was removed and he has a plantar ulcer near the fourth metatarsal head 12/28/17 on evaluation today patient appears to be doing acutely worse in regard to his left foot. The wound which has been appearing to do better as now open up more deeply there is bone palpable at the base of the wound unfortunately. He tells me that this feels "like it did when he had osteomyelitis previously" he also noted that his second toe on the left foot appears to be doing worse and is swollen there does appear to be some fluid collected underneath. His right foot plantar ulcer appears to be doing somewhat better at this point and there really is no complication at the site currently. No fevers, chills, nausea, or vomiting noted at this time. Patient  states that he normally has no pain at this site however. Today he is having significant pain. 01/03/18; culture done last week showed methicillin sensitive staph aureus and group B strep. He was on [place] however he arrives today with a fever of 101. He has a functional dialysis shunt in his left arm but has had no pain here. No cough. He still makes some urine no dysuria he did have abdominal pain nausea and diarrhea over the weekend but is not had any diarrhea since yesterday. Otherwise he has no specific complaints 01/05/18; the patient returns today in follow-up for his presentation from 1/8. At that point he was febrile. I gave him some Levaquin adjusted for his dialysis status. He tells me the fever broke that night and it is not likely that this was actually a  wound infection. He is gone on to have an MRI of the left foot; this showed interval development of an abnormal signal from the base of the third and fourth metatarsal and in the cuboid reminiscent consistent with osteomyelitis. There is no mention of the left second toe I think which was a concern when it was ordered. The patient is taking his Levaquin 01/10/18; the patient has completed his antibiotics today. The area on the left lateral foot is smaller but it still probes easily to bone. He has underlying osteomyelitis here and sees Dr. Megan Salon of infectious disease this week ooThe area on the right third plantar metatarsal head still requires debridement not much change in dimensions [facility] has a new wound on the medial tip of his right third toe again this probes to bone. He only noticed this 2 days ago 01/17/18; the patient saw Dr. [name] last week and he is back on Vanco and Fortaz at dialysis. 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 We are previously seeing him for a wound also on the base of the fourth med head  on the right. X- ray of this area did not show osteomyelitis in relation to a new wound on the tip of his right third toe. Again this probes to bone. Finally his second toe on the left which didn't really show anything on the MRI at least no report appears to have a separated cutaneous area which I think is going to come right off the tip of his toe and leave exposed bone. Whether this is infectious or ischemic I am not clear Culture I did from the third toe last week which was his new wound was negative. Plain x-ray on the right foot did not show osteomyelitis in the toes 01/24/18; the patient is going went to see [name]. He is going to have a amputation of the left second and right third toe although paradoxically the left second toe looks better than last week. We have treating a deep probing wound on the left lateral foot and the area over the third metatarsal head on the right foot. 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 Also a debridement including bone of the left lateral foot and a closure which is still sutured. This is a bit surprising. Also apparent debridement of the base of the right third toe wound. 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 I'm not sure exactly where we are in the frame of things here. The wound on the left lateral foot is still sutured also the amputation sites of the left second and right third toe. He is been using Xeroform to the sutured areas silver alginate to the right foot 02/14/18; he continues on Rocephin at dialysis for osteomyelitis. I still don't have a good sense of where we are in the treatment duration. The wounds on the left lateral foot had the sutures removed and this clearly still probes deeply. We debrided the  area with a #5 curet. We'll use silver alginate to the wound oothe area over the right third met head also required debridement of callus skin and subcutaneous tissue and necrotic  debris over the wound surface. This tunnels superiorly but I did not unroofed this today. ooThe areas on the tip of his toes have a crusted surface eschar I did not debridement this either. 02/21/18; he continues on Rocephin at dialysis for osteomyelitis. I don't have a good sense of where we are and the treatment duration. The wounds on the left lateral foot is callused over on presentation but requires debridement. The area on the right third med head plantar aspect actually is measuring smaller 02/28/18;patient is on Vanco and Fortaz at dialysis, I'm not sure why I thought this was Rocephin above unless it is been changed. I don't have a good sense of time frame here. Using silver collagen to the area on the left lateral foot and right third med head plantar foot 03/07/18; patient is completing bank and Fortaz at dialysis soon. He is using Silver collagen to the area on the left lateral foot and the right third metatarsal head. He has a smaller superficial wound distal to the left lateral foot wound. 03/14/18-he is here in follow-up evaluation for multiple ulcerations to his bilateral feet. He presents with a new ulceration to the plantar aspect of the left foot underneath the blister, this was deroofed to reveal a partial thickness ulcer. He is voicing no complaints or concerns, tolerated dialysis yesterday. We will continue with same treatment plan and he will follow-up next week 03/21/18; the patient has a area on the lateral left foot which still has a small probing area. The overall surface area of the wound is better. He presented with a new ulceration on the left foot plantar fifth metatarsal head last week. The area on the right third metatarsal head appears smaller.using silver alginate to all wound areas. The  patient is changing the dressing himself. He is using a [place] forefoot offloading are on the right and a healing sandal on the left 03/28/18; original wound left lateral foot. Still nowhere close to looking like a heeling surface oosecondary wound on the left lateral foot at the base of his question fifth metatarsal which was new last week ooRight third metatarsal head. ooWe have been using silver alginate all wounds 04/04/18; the original wound on the left lateral foot again heavy callus surrounding thick nonviable subcutaneous tissue all requiring debridement. The new wound from 2 weeks ago just near this at the base of the fifth metatarsal head still looks about the same ooUnfortunately there is been deterioration in the third medical head wound on the right which now probes to bone. I must say this was a superficial wound at one point in time and I really don't have a good frame of reference year. I'll have to go back to look through records about what we know about the right foot. 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 He is also been to see [name]. Somewhere in here as somebody is ordered a VASCULAR evaluation which was done on 03/22/18. On the right is anterior tibial artery was monophasic triphasic at the posterior tibial artery. On the left anterior tibial artery monophasic posterior tibial artery monophasic. ABI in the right was 1.43 on the left 1.21. TBIs on the right at 0.41 and on the left at 0.32. A vascular consult was recommended and I think has been arranged. 04/11/18; [name] has not had an MRI of the right foot since 2017. Recent x-ray of the right foot  done in January and February was negative. However he has had a major deterioration in the wound over the third met head. He is completed antibiotics last week at dialysis [place]. I think he is going to see  vascular in follow-up. The areas on the left lateral foot and left plantar fifth metatarsal head both look satisfactory. I debrided both of these areas although the tissue here looks good. The area on the left lateral foot once probe to bone it certainly does not do that now 04/18/18; MRI of the right foot is on Thursday. He has what looks to be serosanguineous purulent drainage coming out of the wound over the right third metatarsal head today. He completed antibiotics bank and Fortaz 2 weeks ago at dialysis. He has a vascular follow-up with regards to his arterial insufficiency although I don't exactly see when that is booked. The areas on the left foot including the lateral left foot and the plantar left fifth metatarsal head look about the same. 04/25/18-He is here in follow-up evaluation for bilateral foot ulcers. MRI obtained was negative for osteomyelitis. Wound culture was negative. We will continue with same treatment plan he'll follow-up next week 05/02/18; the right plantar foot wound over the third metatarsal head actually looks better than when I last saw this. His MRI was negative for osteomyelitis wound culture was negative. ooOn the left plantar foot both wounds on the plantar fifth metatarsal head and on the lateral foot both are covered in a very hard circumferential callus. 05/09/18; right plantar foot wound over the third metatarsal head stable from last week. ooLeft plantar foot wound over the fifth metatarsal head also stable but with callus around both wound areas ooThe area on the left lateral foot had thick callus over a surface and I had some thoughts about leaving this intact however it felt boggy. ooWe've been using silver alginate all wounds 05/16/18; since the patient was last here he was hospitalized from 5/15 through 5/19. 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 This grew MRSA. He was placed on vancomycin.MRI of the left foot suggested "progressive" osteomyelitis and bone destruction of the cuboid and the base of the fifth metatarsal. Stable erosive changes of the base of the second metatarsal. I'm wondering if they're aware that he had surgery and debridement in the area of the underlying bone previously by [name]. In any case, He was also revascularized with an angioplasty and stenting of the left tibial peroneal trunk and angioplasty of the left posterior tibial artery. He is on vancomycin at dialysis. He has a 2 week follow-up with infectious disease. He has vascular surgery follow-up. He was started on Plavix. 05/23/18; the patient's wound on the lateral left foot at the level of the fifth metatarsal head and the plantar wound on the plantar fifth metatarsal head both look better. The area on the right third metatarsal head still has depth and undermining. We've been using silver alginate. He is on vancomycin. He has been revascularized on the left 05/30/18; he continues on vancomycin at dialysis. Revascularized on the left. The area on the left lateral foot is just about closed. Unfortunately the area over the plantar fifth metatarsal head undermining superiorly as does the area over the right third metatarsal head. Both of these significantly deteriorated from last week. He has appointments next week with infectious disease and orthopedics I  delayed putting on a cast on the left until those appointments which therefore we made we'll bring him in on Friday the 14th with the idea of a cast on the left foot 06/06/18; he continues on vancomycin at dialysis. Revascularized on the left. He arrives today with a ballotable swelling just above the area on the left lateral foot where his previous wound was. He has no pain but he is reasonably insensate. The difficult areas on the plantar left fifth metatarsal head looks stable whereas the area  on the right third plantar metatarsal head about the same as last week there is undermining here although I did not unroofed this today. He required a considerable debridement of the swelling on the left lateral foot area and I unroofed an abscess with a copious amount of brown purulent material which I obtained for culture. I don't believe during his recent hospitalization he had any further imaging although I need to review this. Previous cultures from this area done in this clinic showed MRSA, I would be surprised if this is not what this is currently even though he is on vancomycin. 06/13/18; he continues on vancomycin at dialysis however he finishes this on Sunday and then graduates the doxycycline previously prescribed by Dr. [name]. The abscess site on the lateral foot that I unroofed last time grew moderate amounts of methicillin-resistant staph aureus that is both vancomycin and tetracycline sensitive so we should be okay from that regard. He arrives today in clinic with a connection between the abscess site and the area on the lateral foot. We've been using silver alginate to all his wound areas. The right third metatarsal head wound is measuring smaller. Using silver alginate on both wound areas 06/20/18; transitioning to doxycycline prescribed by Dr. [name]. The abscess site on the lateral foot that I unroofed 2 weeks ago grew MRSA. That area has largely closed down although the lateral part of his wound on the fifth metatarsal head still probes to bone. I suspect all of this was connected. ooOn the right third metatarsal head smaller looking wound but surrounded by nonviable tissue that once again requires debridement using silver alginate on both areas 06/27/18; on doxycycline 100 twice a day. The abscess on the lateral foot has closed down. Although he still has the wound on the left fifth metatarsal head that extends towards the lateral part of the foot. The most lateral part of  this wound probes to bone ooRight third metatarsal head still a deep probing wound with undermining. Nevertheless I elected not to debridement this this week ooIf everything is copious static next week on the left I'm going to attempt a total contact cast 07/04/18; he is tolerating his doxycycline. The abscess on the left lateral foot is closed down although there is still a deep wound here that probes to bone. We will use silver collagen under the total contact cast Silver collagen to the deep wound over the right third metatarsal head is well 07/11/18; he is tolerating doxycycline. The left lateral fifth plantar metatarsal head still probes deeply but I could not probe any bone. We've been using silver collagen and this will be the second week under the total contact cast ooSilver collagen to deep wound over the right third metatarsal head as well 07/18/18; he is still taking doxycycline. The left lateral fifth plantar metatarsal this week probes deeply with a large amount of exposed bone. Quite a deterioration. He required extensive debridement. Specimens of bone for pathology and CNS obtained   ooAlso considerable debridement on the right third metatarsal head 07/25/18; bone for pathology last week from the lateral left foot showed osteomyelitis. CandS of the bone Enterobacter. And Klebsiella. He is now on ciprofloxacin and the doxycycline is stopped [Dr. [name] of infectious disease] area He has an appointment with Dr. [name] tomorrow I'm going to leave the total contact cast off for this week. May wish to try to reapply that next week or the week after depending on the wound bed looks. I'm not sure if there is an operative option here, previously is followed with [name] 08/01/18 on evaluation today patient presents for reevaluation. He has been seen by infectious disease, Dr. [name], and he has placed the patient back on Ceftaz currently to be given to him at dialysis three times a  week. 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 Fortunately there does not appear to be the evidence of infection at this point in time. Overall the patient has been tolerating the dressing changes without complication. We are no longer due lives in the cast of left foot secondary to the infection obviously. 08/10/18 on evaluation today patient appears to be doing rather well all things considered in regard to his left plantar foot ulcer. He does have a significant callous on the lateral portion of his left foot he wonders if I can help clean that away to some degree today. Fortunately he is having no evidence of infection at this time which is good news. No fevers chills noted. With that being said his right plantar foot actually does have a significant callous buildup around the wound opening this seems to not be doing as well as I would like at this point. I do believe that he would benefit from sharp debridement at this site. Subsequently I think a total contact cast would be helpful for the right foot as well. 08/17/18; the patient arrived today with a total contact cast on the right foot. This actually looks quite good. He had gone to see Dr. Megan Salon of infectious disease about the osteomyelitis on the left foot. I think he is on IV Fortaz at dialysis although I am not exactly sure of the rationale for the Fortaz at the time of this dictation. He also arrived in today with a swelling on the lateral left foot this is the site of his original wounds in fact when I first saw this man when he was under Dr. Ardeen Garland care I think this was the site of where the wound was located. It was not particularly tender however using a small scalpel I opened it to [place] some moderate amount of purulent drainage. We've been using silver alginate to all wound areas and a total contact cast on the right foot 08/24/18; patient continues on  Fortaz at dialysis for osteomyelitis left foot. Last MRI was in May that showed progressive osteomyelitis and bone destruction of the cuboid and base of the fifth metatarsal. Last week he had an abscess over this same area this was removed. Culture was negative. ooWe are continuing with a total contact cast to the area on the third metatarsal head on the right and making some good progress here. The patient asked me again about renal transplant. He is not on the list because of the open wounds. 08/31/18; apparently the patient had an interruption in the [place] but he is now back on this at dialysis. Apparently there was a  misunderstanding and Dr. Crissie Figures orders. Due to have the MRI of the left foot tonight. ooThe area on the right foot continues to have callus thick skin and subcutaneous tissue around the wound edge that requires constant debridement however the wound is smaller we are using a total contact cast in this area. Alginate all wounds 09/07/18; without much surprise the MRI of his left foot showed osteomyelitis in the reminiscent of his fourth metatarsal but also the third metatarsal. She arrives in clinic today with the right foot and a total contact cast. There was purulent drainage coming out of the wound which I have cultured. Marked deterioration here with undermining widely around the wound orifice. Using pickups and a scalpel I remove callus and subcutaneous tissue from a substantial new opening. ooIn a similar fashion the area on the left lateral foot that was blistered last week I have opened this and remove skin and subcutaneous tissue from this area to expose a obvious new wound. I think there is extension and communication between all of this on the left foot. ooThe patient is on Fortaz at dialysis. Culture done of the right foot. We are clearly not making progress here. ooWe have made him an appointment with [name] of orthopedics. I think an amputation of the left  leg may be discussed. I don't think there is anything that can be done with foot salvage. 09/14/18; culture from the right foot last time showed a very resistant MRSA. This is resistant to doxycycline. I'm going to try to get linezolid at least for a week. He does not have a appointment with [name] yet. 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 I'll send a message to Dr. [name] of infectious disease. I want the patient to see [name] to go over the pros and cons of an amputation which I think will be a BKA. The patient had a question about whether this is curative or not. I told him that I thought it would be although spread of staph aureus infection is not unheard of. MRI of the right foot was done at the end of April 2019 did not show osteomyelitis. The patient last saw Dr. [name] on 9/3. I'll send Dr. [name] a message. I would really like him to weigh the pros and cons of a BKA on the left otherwise he'll probably need IV antibiotics and perhaps hyperbaric oxygen again. He has not had a good response to this in the past. His MRI earlier this month showed progressive damage in the remnants of the fourth and third metatarsal 09/21/2018; sees [name] of orthopedics next week to discuss a left BKA in response to the osteomyelitis in the left foot. Using silver alginate to all wound areas. He is completing the Zyvox I did put in for him after last culture showed MRSA 09/29/2018; patient saw [name] of orthopedics discussed the osteomyelitis in the left foot third and fourth metatarsals. He did not recommend urgent surgery but certainly stated the only surgical option would be a BKA. He is communicating with Dr. [name]. The problem here is the instability of the areas on his foot which constantly generate draining abscesses. I will communicate with Dr. [name] about this. He has an appointment on 10/23 10/05/2018; patient sees  Dr. [name] on 10/23. I have sent him a secure message not ordered any additional antibiotics for now. Patient continues to have 2 open areas on the  left plantar foot at the fifth metatarsal head and the lateral aspect of the foot. These have not changed all that much. The area on the right third met head still has thick callus and surrounding subcutaneous tissue we have been using silver alginate 10/12/2018; patient sees Dr. [name] or colleague on 10/23. 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 The area on the right plantar foot is just about closed. The superior wound on the left fifth metatarsal head is callused over but I am not sure if this is closed. The area below it is about the same. Considerable amount of callus on the lateral foot. We have been using silver alginate to the wounds 10/19/2018; the patient saw Dr. [name] on 10/22. He wishes to try and continue to save the left foot. He has been given 6 weeks of oral ciprofloxacin. We continue to put a total contact cast on the right foot. The area over the fifth metatarsal head on the left is callused over/perhaps healed but I did not remove the callus to find out. He still has the wound on the left lateral midfoot requiring debridement. We have been using silver alginate to all wound areas 10/26/2018 ooOn the left foot our intake nurse noted some purulent drainage from the inferior wound [currently the only one that is not callused over] ooOn the right foot even though he is in total contact cast a considerable amount of thick black callus and surface eschar. On this side we have been using silver alginate under a total contact cast. oohe remains on ciprofloxacin as prescribed by Dr. [name] 11/02/2018; culture from last week which is done of the probing area on the left midfoot wound showed MRSA "few". I am going to need to contact Dr. [name] which I will do today I  think he is going to need IV antibiotics again. The area on the left foot which was callused on the side is clearly separating today all of this was removed a copious amounts of callus and necrotic subcutaneous tissue. ooThe area on the right plantar foot actually remained healthy looking with a healthy granulated base. ooWe are using silver alginate to all wound areas 11/09/2018; unfortunately neither 1 of the patient's wounds areas looks at all satisfactory. On the left he has considerable necrotic debris from the plantar wound laterally over the foot. I removed copious amounts of material including callus skin and subcutaneous tissue. On the right foot he arrives with undermining and frankly purulent drainage. Specimen obtained for culture and debridement of the callused skin and subcutaneous tissue from around the circumference. Because of this I cannot put him back in a total contact cast but to be truthful we are really unfortunately not making a lot of progress I did put in a secure text message to Dr. [name] wondering about the MRSA on the left foot. He suggested vancomycin although this is not started. I was left wondering if he expected me to send this into dialysis. Has we have more purulence on the right side wait for that result before calling dialysis 11/16/2018; I called dialysis earlier this week to get the vancomycin started. I think he got the first dose on Tuesday. The patient tells me that he fell earlier this week twisting his left foot and ankle and he is swelling. He continues to not look well. He had blood cultures done at dialysis on Tuesday he is not been informed of the  results. 12/07/2018; the patient was admitted to hospital from 11/22/2018 through 12/02/2018. He underwent a left BKA. Apparently had staph sepsis. In the meantime being off the right foot this is closed over. He follows up with [name] this afternoon Readmission: 01/10/19 upon evaluation today  patient appears for follow-up in our clinic status post having had a left BKA on 11/20/18. Subsequent to this he actually had multiple falls in fact he tells me to following which calls the wound to be his apparently. He has been seeing [name] and his physician assistant in the interim. They actually did want him to come back for reevaluation to see if there's anything we can do for me wound care perspective the help this area to heal more appropriately. The patient states that he does not have a tremendous amount of pain which is good news. No fevers, chills, nausea, or vomiting noted at this time. We have gotten approval from [name] office had [place] for [place] to treat the patient for his stop wound. This is due to the fact that the patient was in the 90 day postop global. 1/21; the patient does not have an open wound on the right foot although he does have some pressure areas that will need to be padded when he is transferring. The dehisced surgical wound looks clean although there is some undermining of 1.5 cm superiorly. 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 This appears healthy but there is undermining. TheraSkin #1 2/11; TheraSkin #2 2/25; TheraSkin #3. Wound is measuring smaller 3/10; TheraSkin #4 wound is measuring smaller 3/24; TheraSkin #5 wound is measuring smaller and looks healthy. 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 He has no open area on the right foot although he does have some callused area over the original third metatarsal head wound. The third metatarsal is also subluxed on the right foot. I have warned him today that he cannot consider wearing a prosthesis for at least a month but he can go for measurements. He is going to need to keep the right foot padded is much as possible in his diabetic shoe indefinitely READMISSION 06/12/2019 [name]  is a type II diabetic on dialysis. He has been in this clinic multiple times with wounds on his bilateral lower extremities. 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 He also has a history of wounds on his right foot but tells me that everything is going well here. He actually came in here with his prosthesis walking with a cane. We were all really quite gratified to see this He states over the last several weeks he has had a painful area at the tip of the left third finger. His dialysis shunt in his is in the left upper arm and this particularly hurts during dialysis when his fingers get numb and there is a lot of pain in the tip of his left third finger. I believe he is seen vascular surgery. He had a Doppler done to evaluate for a dialysis steal syndrome. He is going for a banding procedure 1 week from today towards the left AV fistula. I think if that is unsuccessful they will be looking at creating a new shunt. He had a course of doxycycline when he which he  finished about a week ago. He has been using Bactroban to the finger 6/25; the patient had his banding procedure and things seem to be going better. He is not having pain in his hand at dialysis. The area on the tip of his finger seems about closed. He did however have a paronychia I the last time I saw him. I have been advising him to use topical Bactroban washing the finger. Culture I did last time showed a few staph epidermidis which I was willing to dismiss as superficial skin contaminant. He arrives today with the finger wound looking better however the paronychia a seems worse 7/9; 2-week follow-up. He does not have an open wound on the tip of his third left finger. I thought he had an initial ischemic wound on the tip of the finger as well as a paronychia a medially. All of this appears to be closed. 8/6-Patient returns after 3  weeks for follow-up and has a right second met head plantar callus with a significant area of maceration hiding an ulcer ABI repeated today on right - 1.26 8/13-Patient returns at 1 week, the right second met head plantar wound appears to be slightly better, it is surrounded by the callus area we are using silver alginate 8/20; the patient came back to clinic 2 weeks ago with a wound on the right second metatarsal head. He apparently told me he developed callus in this area but also went away from his custom shoes to a pair of running shoes. Using silver alginate. 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 There might be much we can do to offload this although the patient tells me he has a wheelchair that he is using to try and stay off the wound is much as possible 8/27; right second metatarsal head. Still small punched out area with thick callus and subcutaneous tissue around the wound. We have been using silver collagen. This is always been an issue with this man's wound especially on this foot 9/3; right second met head. Still the same punched-out area with thick callus and subcutaneous tissue around the wound removing the circumference demonstrates repetitively undermining area. I have removed all the subcutaneous tissue associated with this. We have been using silver collagen 9/15; right second metatarsal head. Same punched-out thick callus and subcutaneous tissue around the wound area. Still requiring debridement we have been using silver collagen I changed back to silver alginate X-ray last time showed no evidence of osteomyelitis. Culture showed Klebsiella and he was started and Keflex apparently just 4 days ago 9/24; right second metatarsal head. He is completed the antibiotics I gave him for 10 days. Same punched-out thick callus and subcutaneous tissue around the area. Still requiring debridement. We have been using silver alginate. The area itself looks swollen  and this could be just [place] that comes on this area with walking on a prosthesis on the left however I think he needs an MRI and I am going to order that today. There is not an option to offload this further 10/1; right second plantar metatarsal head. MRI booked for October 6. Generally looking better today using silver alginate 10/8; right second plantar metatarsal head. MRI that was done on 10/6 was negative for osteomyelitis noted prior amputations of the second and fourth toes at the [facility]. Prior amputation of the third toe to the base of the middle phalanx. Prior amputation of the fifth toe to the head of the metatarsal. 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 He does not recall about hearing about this. He did not have any pain but then again he has reduced sensation from diabetic neuropathy 10/15; right second plantar metatarsal head. Still in the same condition callus nonviable subcutaneous tissue which cleans up nicely with debridement but reforms by the next week. The patient tells me that he is offloading this is much as he can including taking his wheelchair to dialysis. We have been using silver alginate, changed to silver collagen today 10/22; right second plantar metatarsal head. Wound measures smaller but still thick callus around this wound. Still requiring debridement 11/5 right second plantar metatarsal head. Less callus around the wound circumference but still requiring debridement. There is still some undermining which I cleaned out the wound looks healthy but still considerable punched out depth with a relatively small circumference to the wound there is no palpable bone no purulent drainage 11/12; right second plantar metatarsal head. Small wound with thick callus tissue around the wound and significant undermining. We have been using silver collagen after my continuous debridements of this area 11/19; patient arrives in  today with purulent drainage coming out of the wound in the second third met head area on the right foot. Serosanguineous thick drainage. Specimen obtained for culture. 11/21/2019 on evaluation today patient appears to be doing well with regard to his foot ulcer. The good news is is culture came back negative for any bacteria there was no growth. The other good news is his x-ray also appears to be doing well. The foot is also measuring much better than what it was. Overall I am very pleased with how things seem to be progressing. 12/22; patient was in [facility] for several weeks due to the death of a family member. He said he stayed off the wound using silver alginate. 01/03/2020. Patient has a small open area with roughly 0.3 cm of circumferential undermining. The orifice looks smaller. We have been using silver collagen. There is not a wound more aggressive way to offload this area as he has a prosthesis on the other leg 1/21; again the same small area is 2 weeks ago. We have been using silver collagen I change that to endoform today I really believe that this is an offloading issue 2/4; the area on the right third met head. We have been using endoform. 2/11 the area on the right third met head we have been using silver alginate. 2/25; the area on the right third met head. There is much less callus on this today. The patient states he is offloading this even more aggressively. As an example he is taking his wheelchair into dialysis on most days 3/4; right third metatarsal head. Again he has eschar over the surface of the wound which I removed with a #3 curette. Some subcutaneous debris. This has 0.8 cm of direct probing depth. I cannot feel any bone here. I did do a culture the area 3/11; right third metatarsal head. Again an eschared area over the wound which almost makes this look superficial. Again debridement reveals a wound with probing depth probably not much different from last  week there is no palpable bone no palpable purulence. The patient tells me that he is making every effort to offload this area properly. He is taking wheelchair into dialysis etc. There is no option for forefoot offloading or a total contact cast. We used Oasis #1 today 3/18; again callus and eschar  over the wound surface. I remove this but still a probing wound although only 3 mm in depth this time. This is an improvement Objective Constitutional Vitals Time Taken: 12:44 PM, Height: 73 in, Weight: 202 lbs, BMI: 26.6, Temperature: 98.5 F, Pulse: 74 bpm, Respiratory Rate: 18 breaths/min, Blood Pressure: 107/53 mmHg, Capillary Blood Glucose: 111 mg/dl. Integumentary (Hair, Skin) Wound #41 status is Open. Original cause of wound was Gradually Appeared. The wound is located on the Right Metatarsal head second. The wound measures 0.1cm length x 0.1cm width x 0.1cm depth; 0.008cm^2 area and 0.001cm^3 volume. There is Fat Layer (Subcutaneous Tissue) Exposed exposed. There is no tunneling or undermining noted. There is a none present amount of drainage noted. The wound margin is well defined and not attached to the wound base. There is no granulation within the wound bed. There is no necrotic tissue within the wound bed. General Notes: callus noted. 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 There was a Excisional Skin/Subcutaneous Tissue Debridement with a total area of 4 sq cm performed by [name]. With the following instrument(s): Curette to remove Viable and  Non-Viable tissue/material. Material removed includes Callus, Subcutaneous Tissue, Skin: Dermis, and Fibrin/Exudate after achieving pain control using Lidocaine 4% Topical Solution. A time out was conducted at 13:00, prior to the start of the procedure. There was no bleeding. The procedure was tolerated well with a pain level of 0 throughout and a pain level of 0 following the procedure. Post Debridement Measurements: 0.3cm length x 0.3cm width x 0.3cm depth; 0.021cm^3 volume. Character of Wound/Ulcer Post Debridement is improved. Severity of Tissue Post Debridement is: Fat layer exposed. Post procedure Diagnosis Wound #41: Same as Pre-Procedure Pre-procedure diagnosis of Wound #41 is a Diabetic Wound/Ulcer of the Lower Extremity located on the Right Metatarsal head second. A skin graft procedure using a bioengineered skin substitute/cellular or tissue based product was performed by [name] with the following instrument(s): Forceps and Scissors. Oasis Burn Matrix was applied and secured with Steri-Strips and adaptic. 2 sq cm of product was utilized and 8 sq cm was wasted due to wound size. Post Application, no dressing was applied. A Time Out was conducted at 13:05, prior to the start of the procedure. The procedure was tolerated well with a pain level of 0 throughout and a pain level of 0 following the procedure. Post procedure Diagnosis Wound #41: Same as Pre-Procedure . Plan Follow-up Appointments: Return Appointment in 1 week. - Thursday Dressing Change Frequency: Wound #41 Right Metatarsal head second: Change dressing every day. Wound Cleansing: May shower and wash wound with soap and water. Primary Wound Dressing: Wound #41 Right Metatarsal head second: Skin Substitute Application - Oasis burn matrix skin sub #2 applied by [name]. secured with adaptic and steri-strips. Leave in place. Secondary Dressing: Wound #41 Right Metatarsal head second: Foam - foam  donut Kerlix/Rolled Gauze Dry Gauze Other: - felt to periwound and place one below wound. lightly wrap coban to hold dressing in place. felt / pad wound Edema Control: Avoid standing for long periods of time Elevate legs to the level of the heart  or above for 30 minutes daily and/or when sitting, a frequency of: - throughout the day. Off-Loading: Other: - felt to periwound and place one below wound. Additional Orders / Instructions: Other: - minimize walking and standing on foot to aid in wound healing and callus buildup. 1. Oasis #2 applied 2. Although it superficially looks healed removing the callus reveals the nonhealed areas but it is small and with less depth. Electronic Signature(s) Signed: 03/13/2020 7:02:26 PM By: Linton Ham [name] Entered By: Linton [name] on 03/13/2020 13:14:03 -------------------------------------------------------------------------------- SuperBill Details Patient Name: Date of Service: [name], [name] 03/13/2020 Medical Record [id number] Patient Account Number: [id number] Date of Birth/Sex: Treating RN: 08-30-1951 (68 y.o. 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, other extremity L03.115 Cellulitis of right lower limb Facility Procedures The patient participates with Medicare or their insurance follows the Medicare Facility Guidelines: CPT4 Description Modifier Quantity Code [id number] Q4103 - Dermal substitute tissue/non-human origin w metabolically active 10 elements- Oasis (Burn  Matrix)product applied per sq cm (Facility Only) The patient participates with Medicare  or their insurance follows the Medicare Facility Guidelines: [id number] - SKIN SUB GRAFT FACE/NK/HF/G 1 ICD-10 Diagnosis Description L97.512 Non-pressure chronic ulcer of other part of right foot with fat layer  exposed E11.621 Type 2 diabetes mellitus with foot ulcer Physician Procedures CPT4 Code Description: [id number] [id number] - WC PHYS SKIN SUB GRAFT FACE/NK/HF/G ICD-10 Diagnosis Description L97.512 Non-pressure chronic ulcer of other part of right foot with f E11.621 Type 2 diabetes mellitus with foot ulcer Modifier: at layer ex Quantity: 1 posed Electronic Signature(s) Signed: 03/13/2020 4:55:35 PM By: [name] Signed: 03/13/2020 7:02:26 PM By: Linton Ham [name] Entered By: [name] on 03/13/2020 14:02:27

## 2020-03-20 ENCOUNTER — Other Ambulatory Visit: Payer: Self-pay

## 2020-03-20 ENCOUNTER — Other Ambulatory Visit (HOSPITAL_COMMUNITY)
Admission: RE | Admit: 2020-03-20 | Discharge: 2020-03-20 | Disposition: A | Discharge status: Home / Self Care | Payer: Medicare Other | Source: Other Acute Inpatient Hospital | Attending: Internal Medicine | Admitting: Internal Medicine

## 2020-03-20 ENCOUNTER — Encounter (HOSPITAL_BASED_OUTPATIENT_CLINIC_OR_DEPARTMENT_OTHER): Payer: Medicare Other | Admitting: Internal Medicine

## 2020-03-20 DIAGNOSIS — E11621 Type 2 diabetes mellitus with foot ulcer: Secondary | ICD-10-CM | POA: Diagnosis not present

## 2020-03-20 NOTE — Progress Notes (Signed)
REMMY, RIFFE ([id number]) Visit Report for 03/20/2020 Debridement Details Patient Name: Date of Service: KINTE, TRIM 03/20/2020 1:00 PM Medical Record [id number] Patient Account Number: [id number] Date of Birth/Sex: [birth year] (68 y.o. M) Treating RN: Primary Care Provider: [name] Other Clinician: Referring Provider: Treating Provider/Extender:Alen Matheson, Birdena Crandall in Treatment: 40 Debridement Performed for Wound #41 Right Metatarsal head second Assessment: Performed By: Physician Ricard Dillon., MD Debridement Type: Debridement Severity of Tissue Pre Fat layer exposed Debridement: Level of Consciousness (Pre- Awake and Alert procedure): Pre-procedure Verification/Time Out Taken: Yes - 13:50 Start Time: 13:51 Pain Control: Lidocaine 4% Topical Solution Total Area Debrided (L x W): 0.5 (cm) x 0.4 (cm) = 0.2 (cm) Tissue and other material Viable, Non-Viable, Callus debrided: Level: Non-Viable Tissue Debridement Description: Selective/Open Wound Instrument: Curette Specimen: Swab, Number of Specimens Taken: 1 Bleeding: Moderate Hemostasis Achieved: Pressure End Time: 13:57 Procedural Pain: 0 Post Procedural Pain: 0 Response to Treatment: Procedure was tolerated well Level of Consciousness Awake and Alert (Post-procedure): Post Debridement Measurements of Total Wound Length: (cm) 0.5 Width: (cm) 0.4 Depth: (cm) 1 Volume: (cm) 0.157 Character of Wound/Ulcer Post Improved Debridement: Severity of Tissue Post Debridement: Fat layer exposed Post Procedure Diagnosis Same as Pre-procedure Electronic Signature(s) Signed: 03/20/2020 6:03:33 PM By: Linton Ham MD Entered By: Linton Ham on 03/20/2020 15:20:17 -------------------------------------------------------------------------------- HPI Details Patient Name: Date of Service: [name] 03/20/2020 1:00 PM Medical Record [id number] Patient Account Number:  [id number] Date of Birth/Sex: Treating RN: 1951-09-22 (69 y.o. M) Primary Care Provider: [name] Other Clinician: Referring Provider: Treating Provider/Extender:Mardi Cannady, Jeronimo Greaves, Buena Irish in Treatment: 40 History of Present Illness Location: Patient presents with a wound to bilateral feet. Quality: Patient reports experiencing essentially no pain. Severity: Mildly severe wound with no evidence of infection Duration: Patient has had the wound for greater than 2 weeks prior to presenting for treatment HPI Description: The patient is a pleasant 69 yrs old bm here for evaluation of ulcers on the plantar aspect of both feet. 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 He has a left arm graft for access. He has been trying to stay off his feet for weeks but does not seem to have any improvement in the wound. He has been seeing someone at the foot center and was referred to the Wound Care center for further evaluation. 12/30/15 the patient has 3 wounds one over the right first metatarsal head and 2 on the left foot at the left fifth and lleft first metatarsal head. All of these look relatively similar. The one over the left fifth probe to bone I could not prove that any of the others did. I note his MRI in November that did not show osteomyelitis. His peripheral pulses seem robust. All of these underwent surgical debridement to remove callus nonviable skin and subcutaneous tissue 01/06/16; the patient had his sutures removed from the right fifth ray amputation. There may be a small open part of this superiorly but otherwise the incision looks good. Areas over his right first, left first and left fifth metatarsal head all underwent surgical debridement as a varying degree of callus, skin and nonviable subcutaneous tissue. The area that is most worrisome is the right fifth metatarsal head which has a wound probes precariously close to  bone. There is no purulent drainage or erythema 01/13/16; I'm not exactly sure of the status of the right fifth ray amputation site however he follows with Dr.  Graves later this week. The area over his right first plantar metatarsal head, left first and fifth plantar metatarsal head are all in the same status. Thick circumferential callus, nonviable subcutaneous tissue. Culture of the left fifth did not culture last week 01/20/16; all of the patient's wounds appear and roughly the same state although his amputation site on the right lateral foot looks better. His wounds over the right first, left first and left fifth metatarsal heads all underwent difficult surgical debridement removing circumferential callus nonviable skin and subcutaneous tissue. There is no overt evidence of infection in these areas. MRI at the end of December of the left foot did not show osteomyelitis, right foot showed osteomyelitis of the right fifth digit he is now status post amputation. 01/27/16 the patient's wounds over his plantar first and fifth metatarsal heads on the left all appear better having been started on a total contact cast last week. They were debridement of circumferential callus and nonviable subcutaneous tissue as was the wound over the first metatarsal head. His surgical incision on the right also had some light surface debridement done. 03/23/2016 -- the patient was doing really well and most of his wounds had almost completely healed but now he came back today with a history of having a discharge from the area of his right foot on the plantar aspect and also between his first and second toe. He also has had some discharge from the left foot. Addendum: I spoke to the PA [name] at the dialysis center whose fax number is [phone]. We discussed the infection the patient has and she will put the patient on vancomycin and Fortaz until the final culture report is back. We will fax this as soon  as available. 03/30/2016 -- left foot x-ray IMPRESSION:No definitive osteomyelitis noted. X-ray of the right foot -- IMPRESSION: 1. Soft tissue swelling. Prior amputation right fifth digit. No acute or focal bony abnormality identified. If osteomyelitis remains a clinical concern, MRI can be obtained. 2. Peripheral vascular disease. His culture reports have grown an MSSA -- and we will fax this report to his hemodialysis center. He was called in a prescription of oral doxycycline but I have told her not to fill this in as he is already on IV antibiotics. 04/06/2016 -- a few days ago, I spoke to the hemodialysis center nurse who had stopped the IV antibiotics and he was given a prescription for doxycycline 100 mg by mouth twice a day for a week and he is on this at the present time. 05/11/2016 -- he has recently seen his PCP this week and his hemoglobin A1c was 7. He is working on his paperwork to get his orthotic shoes. 05/25/2016 -- -- x-ray of the left foot IMPRESSION:No acute bony abnormality. No radiographic changes of acute osteomyelitis. No change since prior study. X-ray of the right foot -- IMPRESSION: Postsurgical changes are seen involving the fifth toe. No evidence of acute osteomyelitis. he developed a large blister on the medial part of his right foot and this opened out and drain fluid. 06/01/2016 -- he has his MRI to be done this afternoon. 06/15/2016 -- MRI of the left forefoot without contrast shows 2 separate regions of cutaneous and subcutaneous edema and possible ulceration and blistering along the ball of the foot. No obvious osteomyelitis identified. MRI of the right foot showed cutaneous and subcutaneous thickening plantar to the first digit sesamoid with an ulcer crater but no underlying osteomyelitis is identified. 06/22/2016 -- the right  foot plantar ulcer has been draining a lot of seropurulent material for the last few days. 06/30/2016 -- spoke to the dialysis  center and I believe I spoke to Malo a PA at the center who discussed with me and agreed to putting Legrand Como on vancomycin until his cultures arrive. On review of his culture report no WBCs were seen or no organisms were seen and the culture was reincubated for better growth. The final report is back and there were no predominant growth including Streptococcus or Staphylococcus. Clinically though he has a lot of drainage from both wounds a lot of undermining and there is further blebs on the left foot towards the interspace between his first and second toe. 08/10/2016 -- the culture from the right foot showed normal skin flora and there was no Staphylococcus aureus was group A streptococcus isolated. 09/21/2016 -- -- MRI of the right foot was done on 09/13/2016 - IMPRESSION: Findings most consistent with acute osteomyelitis throughout the great toe, sesamoid bones and plantar aspect of the head of the first metatarsal. Fluid in the sheath of the flexor tendon of the great toe could be sympathetic but is worrisome for septic tenosynovitis. First MTP joint effusion worrisome for septic joint. He was admitted to the hospital on 09/11/2016 and treated for a fever with vancomycin and cefepime. He was seen by Dr. Bobby Rumpf of infectious disease who recommended 6 weeks treatment with vancomycin and ceftazidime with his hemodialysis and to continue to see as in the wound clinic. Vascular consult was pending. The patient was discharged home on 09/14/2016 and he would continue with IV antibiotics for 6 weeks. 09/28/16 wound appears reasonably healthy. Continuing with total contact cast 10/05/16 wound is smaller and looking healthy. Continue with total contact cast. He continues on IV antibiotics 10/12/2016 -- he has developed a new wound on the dorsal aspect of his left big toe and this is a superficial injury with no surrounding cellulitis. He has completed 30 days of IV antibiotics and is now  ready to start his hyperbaric oxygen therapy as per his insurance company's recommendation. 10/19/2016 -- after the cast was removed on the right side he has got good resolution of his ulceration on the right plantar foot. he has a new wound on the left plantar foot in the region of his fourth metatarsal and this will need sharp debridement. 10/26/2016 -- Xray of the right foot complete: IMPRESSION: Changes consistent with osteomyelitis involving the head of the first metatarsal and base of the first proximal phalanx. The sesamoid bones are also likely involved given their positioning. 11/02/2016 -- there still awaiting insurance clearance for his hyperbaric oxygen therapy and hopefully he will begin treatment soon. 11/09/2016 -- started with hyperbaric oxygen therapy and had some barotrauma to the right ear and this was seen by ENT who was prescribed Afrin drops and would probably continue with HBO and he is scheduled for myringotomy tubes on Friday 11/16/2016 -- he had his myringotomy tubes placed on Friday and has been doing much better after that with some fluid draining out after hyperbaric treatment today. The pain was much better. 11/30/2016 -- over the last 2 days he noticed a swelling and change of color of his right second toe and this had been draining minimal fluid. 12/07/2016 -- x-ray of the right foot -- IMPRESSION:1. Progressive ulceration at the distal aspect of the second digit with significant soft tissue swelling and osseous changes in the distal phalanx compatible with osteomyelitis. 2. Chronic osteomyelitis at  the first MTP joint. 3. Ulcerations at the second and third toes as well without definite osseous changes. Osteomyelitis is not excluded. 4. Fifth digit amputation. On 12/01/2016 I spoke to Dr. Bobby Rumpf, the infectious disease specialist, who kindly agreed to treat this with IV antibiotics and he would call in the order to the dialysis center and this has been  discussed in detail with the patient who will make the appropriate arrangements. The patient will also book an appointment as soon as possible to see Dr. [name] in the office. Of note the patient has not been on antibiotics this entire week as the dialysis center did not receive any orders from Dr. [name]. I got in touch with Dr. [name] who tells me the patient has an appointment to see him this coming Wednesday. 12/14/2016 -- the patient is on ceftazidime and vancomycin during his dialysis, and I understand this was put on by his nephrologist Dr. Raliegh Ip possibly after speaking with infectious disease Dr. [name] 12/23/16; patient was on my schedule today for a wound evaluation as he had difficulties with his schedule earlier this week. I note that he is on vancomycin and ceftazidine at dialysis. In spite of this he arrives today with a new wound on the base of the right second toe this easily probes to bone. The known wound at the tip of the second toe With this area. He also has a superficial area on the medial aspect of the third toe on the side of the DIP. This does not appear to have much depth. The area on the plantar left foot is a deep area but did not probe the bone 12/28/16 -- he has brought in some lab work and the most recent labs done showed a hemoglobin of 12.1 hematocrit of 36.3, neutrophils of 51% WBC count of 5.6, BN of 53, albumin of 4.1 globulin of 3.7, vancomycin 13 g per mL. 01/04/2017 -- he saw Dr. [name] who has recommended a amputation of the right second toe and he is awaiting this date. He sees Dr. Bobby Rumpf of infectious disease tomorrow. The bone culture taken on 12/28/2016 had no growth in 2 days. 01/11/2017 -- was seen by Dr. Bobby Rumpf regarding the management and has recommended a eval by vascular surgeons. He recommended to continue the antibiotics during his hemodialysis. Xray of the left foot -- IMPRESSION: No acute fracture, dislocation, or osseous  erosion identified. 01/18/2017 -- he has his vascular workup later today and he is going to have his right foot second toe amputation this coming Friday by Dr. [name]. We have also put in for a another 30 treatments with hyperbaric oxygen therapy 01/25/2017 -- I have reviewed Dr. Donnetta Hutching is vascular report from last week where he reviewed him and thought his vascular function was good enough to heal his amputation site and no further tests were recommended. He also had his orthopedic related to surgery which is still pending the notes and we will review these next week. 02/01/2017 -- the operative remote of Dr. 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 02/08/2017 --he was seen by Dr. [name] on 02/03/2017 -- patient is on ceftaz and Vanco. after a thorough  review he had recommended to continue with antibiotics during hemodialysis. The patient was seen by Dr. [name], but I do not find any follow-up note on the electronic medical record. he has removed the dressing over the right foot to remove the sutures and asked him to see him back in a month's time 02/22/2017 -- patient has been febrile and has been having symptoms of the upper respiratory tract infection but has not been checked for the flu and it's been over 4 days now. He says he is feeling better today. Some drainage between his left first and second toe and this needed to be looked at 03/08/2017 -- was seen by Dr. Bobby Rumpf on 03/07/2017 after review he will stop his antibiotics and see him back in a month to see if he is feeling well. 03/15/2017 -- he has completed his course of  hyperbaric oxygen therapy and is doing fine with his health otherwise. 03/22/2017 -- his nutritionist at the dialysis center has recommended a protein supplement to help build his collagen and we will prescribe this for him when he has the details 05/03/2017 -- he recently noticed the area on the plantar aspect of his fifth metatarsal head which opened out and had minimal drainage. 05/10/2017 -- -- x-ray of the left foot -- IMPRESSION:1. No convincing conventional radiographic evidence of active osteomyelitis.2. Active soft tissue ulceration at the tip of the fifth digit, and at the lateral aspect of the foot adjacent the base of the fifth metatarsal. 3. Surgical changes of prior fourth toe amputation. 4. 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 5. Small vessel atherosclerotic vascular calcifications. 6. 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 ======= Readmission after 5 weeks: 06/15/2017 -- the patient returns after 5 weeks having had an MRI done on 05/17/2017 MRI of the left foot without contrast showed soft tissue ulcer overlying the base of the fifth metatarsal. 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 No drainable fluid collection to suggest an abscess. He was in hospital between 05/16/2017 and 05/25/2017 -- and on discharge was asked to follow-up with Dr. [name] and Dr. [name]. He was treated 2 weeks post discharge with vancomycin plus ceftriaxone with hemodialysis and oral metronidazole 500 mg 3 times a day. 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 last hemoglobin A1c was 6.7% He had a postoperative MR of the foot on 05/23/2017 -- which showed no new areas of cortical bone loss, edema or soft tissue ulceration to suggest osteomyelitis. the patient has completed his course of  IV antibiotics during dialysis and is to see the infectious disease doctor tomorrow. Dr. [name] had seen him after suture removal and asked him to keep the wound with a dry dressing 06/21/2017 -- he was seen by Dr. Bobby Rumpf of infectious disease on 06/16/2017 -- he stopped his Flagyl and will see him back in 6 weeks. He was asked to follow-up with Dr. [name] and with the wound clinic clinic. 07/05/17; bone biopsy from last time showed acute osteomyelitis. This is from the reminiscent in the left fifth metatarsal. Culture result is apparently still pending [holding for anaerobe}. From my understanding in this case this is been a progressive necrotic wound which is deteriorated markedly over the last 3 weeks since he returned here. He now has a large area of exposed bone which was  biopsied and cultured last week. Dr. Graylon Good has put him on vancomycin and Fortaz during his hemodialysis and Flagyl orally. He is to see Dr. [name] next week 07/19/2017 -- the patient was reviewed by Dr. [name] of orthopedics who reviewed the case in detail and agreed with the plan to continue with IV antibiotics, aggressive wound care and hyperbaric oxygen therapy. He would see him back in 3 weeks' time 08/09/2017 -- saw Dr. Bobby Rumpf on 08/03/2017 -he was restarted on his antibiotics for 6 weeks which included vanco, ceftaz and Flagyl. he recommended continuing his antibiotics for 6 weeks and reevaluate his completion date. He would continue with wound care and hyperbaric oxygen therapy. 08/16/2017 -- I understand he will be completing his 6 weeks of antibiotics sometime later this week. 09/19/2017 -- he has been without his wound VAC for the last week due to lack of supplies. He has been packing his wound with silver alginate. 10/04/2017 -- he has an appointment with Dr. [name] tomorrow and hence we will not apply the wound VAC after his dressing changes today. 10/11/2017 -- he was seen by Dr. Bobby Rumpf on 10/05/2017, noted that the patient was currently off antibiotics, and after thorough review he recommended he follow-up with the wound care as per plan and no further antibiotics at this point. 11/29/17; patient is arrived for the wound on his left lateral foot.Marland Kitchen He is completed IV antibiotics and 2 rounds of hyperbaric oxygen for treatment of underlying osteomyelitis. He arrives today with a surface on most of the wound area however our intake nurse noted drainage from the superior aspect. This was brought to my attention. 12/13/2017 -- the right plantar foot had a large bleb and once the bleb was opened out a large callus and subcutaneous debris was removed and he has a plantar ulcer near the fourth metatarsal head 12/28/17 on evaluation today patient appears to be doing acutely worse in regard to his left foot. The wound which has been appearing to do better as now open up more deeply there is bone palpable at the base of the wound unfortunately. He tells me that this feels "like it did when he had osteomyelitis previously" he also noted that his second toe on the left foot appears to be doing worse and is swollen there does appear to be some fluid collected underneath. His right foot plantar ulcer appears to be doing somewhat better at this point and there really is no complication at the site currently. No fevers, chills, nausea, or vomiting noted at this time. Patient states that he normally has no pain at this site however. Today he is having significant pain. 01/03/18; culture done last week showed methicillin sensitive staph aureus and group B strep. He was on Septra however he arrives today with a fever of 101. He has a functional dialysis shunt in his left arm but has had no pain here. No cough. He still makes some urine no dysuria he did have abdominal pain nausea and diarrhea over the weekend but is not had any diarrhea since yesterday. Otherwise he has no specific  complaints 01/05/18; the patient returns today in follow-up for his presentation from 1/8. At that point he was febrile. I gave him some Levaquin adjusted for his dialysis status. He tells me the fever broke that night and it is not likely that this was actually a wound infection. He is gone on to have an MRI of the left foot; this showed interval development of an  abnormal signal from the base of the third and fourth metatarsal and in the cuboid reminiscent consistent with osteomyelitis. There is no mention of the left second toe I think which was a concern when it was ordered. The patient is taking his Levaquin 01/10/18; the patient has completed his antibiotics today. The area on the left lateral foot is smaller but it still probes easily to bone. He has underlying osteomyelitis here and sees Dr. Megan Salon of infectious disease this week The area on the right third plantar metatarsal head still requires debridement not much change in dimensions He has a new wound on the medial tip of his right third toe again this probes to bone. He only noticed this 2 days ago 01/17/18; the patient saw Dr. [name] last week and he is back on Vanco and Fortaz at dialysis. 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 We are previously seeing him for a wound also on the base of the fourth med head on the right. X- ray of this area did not show osteomyelitis in relation to a new wound on the tip of his right third toe. Again this probes to bone. Finally his second toe on the left which didn't really show anything on the MRI at least no report appears to have a separated cutaneous area which I think is going to come right off the tip of his toe and leave exposed bone. Whether this is infectious or ischemic I am not clear Culture I did from the third toe last week which was his new wound was negative. Plain  x-ray on the right foot did not show osteomyelitis in the toes 01/24/18; the patient is going went to see Dr. [name]. He is going to have a amputation of the left second and right third toe although paradoxically the left second toe looks better than last week. We have treating a deep probing wound on the left lateral foot and the area over the third metatarsal head on the right foot. 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 Also a debridement including bone of the left lateral foot and a closure which is still sutured. This is a bit surprising. Also apparent debridement of the base of the right third toe wound. 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 I'm not sure exactly where we are in the frame of things here. The wound on the left lateral foot is still sutured also the amputation sites of the left second and right third toe. He is been using Xeroform to the sutured areas silver alginate to the right foot 02/14/18; he continues on Rocephin at dialysis for osteomyelitis. I still don't have a good sense of where we are in the treatment duration. The wounds on the left lateral foot had the sutures removed and this clearly still probes deeply. We debrided the area with a #5 curet. We'll use silver alginate to the wound the area over the right third met head also required debridement of callus skin and subcutaneous tissue and necrotic debris over the wound surface. This tunnels superiorly but I did not unroofed this today. The areas on the tip of  his toes have a crusted surface eschar I did not debridement this either. 02/21/18; he continues on Rocephin at dialysis for osteomyelitis. I don't have a good sense of where we are and the treatment duration. The wounds on the  left lateral foot is callused over on presentation but requires debridement. The area on the right third med head plantar aspect actually is measuring smaller 02/28/18;patient is on Vanco and Fortaz at dialysis, I'm not sure why I thought this was Rocephin above unless it is been changed. I don't have a good sense of time frame here. Using silver collagen to the area on the left lateral foot and right third med head plantar foot 03/07/18; patient is completing bank and Fortaz at dialysis soon. He is using Silver collagen to the area on the left lateral foot and the right third metatarsal head. He has a smaller superficial wound distal to the left lateral foot wound. 03/14/18-he is here in follow-up evaluation for multiple ulcerations to his bilateral feet. He presents with a new ulceration to the plantar aspect of the left foot underneath the blister, this was deroofed to reveal a partial thickness ulcer. He is voicing no complaints or concerns, tolerated dialysis yesterday. We will continue with same treatment plan and he will follow-up next week 03/21/18; the patient has a area on the lateral left foot which still has a small probing area. The overall surface area of the wound is better. He presented with a new ulceration on the left foot plantar fifth metatarsal head last week. The area on the right third metatarsal head appears smaller.using silver alginate to all wound areas. The patient is changing the dressing himself. He is using a Darco forefoot offloading are on the right and a healing sandal on the left 03/28/18; original wound left lateral foot. Still nowhere close to looking like a heeling surface secondary wound on the left lateral foot at the base of his question fifth metatarsal which was new last week Right third metatarsal head. We have been using silver alginate all wounds 04/04/18; the original wound on the left lateral foot again heavy callus surrounding thick nonviable subcutaneous  tissue all requiring debridement. The new wound from 2 weeks ago just near this at the base of the fifth metatarsal head still looks about the same Unfortunately there is been deterioration in the third medical head wound on the right which now probes to bone. I must say this was a superficial wound at one point in time and I really don't have a good frame of reference year. I'll have to go back to look through records about what we know about the right foot. 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 He is also been to see Dr. [name]. Somewhere in here as somebody is ordered a VASCULAR evaluation which was done on 03/22/18. On the right is anterior tibial artery was monophasic triphasic at the posterior tibial artery. On the left anterior tibial artery monophasic posterior tibial artery monophasic. ABI in the right was 1.43 on the left 1.21. TBIs on the right at 0.41 and on the left at 0.32. A vascular consult was recommended and I think has been arranged. 04/11/18; Mr. Pepper has not had an MRI of the right foot since 2017. Recent x-ray of the right foot done in January and February was negative. However he has had a major deterioration in the wound over the third  met head. He is completed antibiotics last week at dialysis [place]. I think he is going to see vascular in follow-up. The areas on the left lateral foot and left plantar fifth metatarsal head both look satisfactory. I debrided both of these areas although the tissue here looks good. The area on the left lateral foot once probe to bone it certainly does not do that now 04/18/18; MRI of the right foot is on Thursday. He has what looks to be serosanguineous purulent drainage coming out of the wound over the right third metatarsal head today. He completed antibiotics bank and Fortaz 2 weeks ago at dialysis. He has a vascular follow-up  with regards to his arterial insufficiency although I don't exactly see when that is booked. The areas on the left foot including the lateral left foot and the plantar left fifth metatarsal head look about the same. 04/25/18-He is here in follow-up evaluation for bilateral foot ulcers. MRI obtained was negative for osteomyelitis. Wound culture was negative. We will continue with same treatment plan he'll follow-up next week 05/02/18; the right plantar foot wound over the third metatarsal head actually looks better than when I last saw this. His MRI was negative for osteomyelitis wound culture was negative. On the left plantar foot both wounds on the plantar fifth metatarsal head and on the lateral foot both are covered in a very hard circumferential callus. 05/09/18; right plantar foot wound over the third metatarsal head stable from last week. Left plantar foot wound over the fifth metatarsal head also stable but with callus around both wound areas The area on the left lateral foot had thick callus over a surface and I had some thoughts about leaving this intact however it felt boggy. We've been using silver alginate all wounds 05/16/18; since the patient was last here he was hospitalized from 5/15 through 5/19. 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 This grew MRSA. He was placed on vancomycin.MRI of the left foot suggested "progressive" osteomyelitis and bone destruction of the cuboid and the base of the fifth metatarsal. Stable erosive changes of the base of the second metatarsal. I'm wondering if they're aware that he had surgery and debridement in the area of the underlying bone previously by Dr. [name]. In any case, He was also revascularized with an angioplasty and stenting of the left tibial peroneal trunk and angioplasty of the left posterior tibial artery. He is on vancomycin at  dialysis. He has a 2 week follow-up with infectious disease. He has vascular surgery follow-up. He was started on Plavix. 05/23/18; the patient's wound on the lateral left foot at the level of the fifth metatarsal head and the plantar wound on the plantar fifth metatarsal head both look better. The area on the right third metatarsal head still has depth and undermining. We've been using silver alginate. He is on vancomycin. He has been revascularized on the left 05/30/18; he continues on vancomycin at dialysis. Revascularized on the left. The area on the left lateral foot is just about closed. Unfortunately the area over the plantar fifth metatarsal head undermining superiorly as does the area over the right third metatarsal head. Both of these significantly deteriorated from last week. He has appointments next week with infectious disease and orthopedics I delayed putting on a cast on the left until those appointments which therefore we made we'll bring him in on  Friday the 14th with the idea of a cast on the left foot 06/06/18; he continues on vancomycin at dialysis. Revascularized on the left. He arrives today with a ballotable swelling just above the area on the left lateral foot where his previous wound was. He has no pain but he is reasonably insensate. The difficult areas on the plantar left fifth metatarsal head looks stable whereas the area on the right third plantar metatarsal head about the same as last week there is undermining here although I did not unroofed this today. He required a considerable debridement of the swelling on the left lateral foot area and I unroofed an abscess with a copious amount of brown purulent material which I obtained for culture. I don't believe during his recent hospitalization he had any further imaging although I need to review this. Previous cultures from this area done in this clinic showed MRSA, I would be surprised if this is not what this is currently  even though he is on vancomycin. 06/13/18; he continues on vancomycin at dialysis however he finishes this on Sunday and then graduates the doxycycline previously prescribed by Dr. [name]. The abscess site on the lateral foot that I unroofed last time grew moderate amounts of methicillin-resistant staph aureus that is both vancomycin and tetracycline sensitive so we should be okay from that regard. He arrives today in clinic with a connection between the abscess site and the area on the lateral foot. We've been using silver alginate to all his wound areas. The right third metatarsal head wound is measuring smaller. Using silver alginate on both wound areas 06/20/18; transitioning to doxycycline prescribed by Dr. [name]. The abscess site on the lateral foot that I unroofed 2 weeks ago grew MRSA. That area has largely closed down although the lateral part of his wound on the fifth metatarsal head still probes to bone. I suspect all of this was connected. On the right third metatarsal head smaller looking wound but surrounded by nonviable tissue that once again requires debridement using silver alginate on both areas 06/27/18; on doxycycline 100 twice a day. The abscess on the lateral foot has closed down. Although he still has the wound on the left fifth metatarsal head that extends towards the lateral part of the foot. The most lateral part of this wound probes to bone Right third metatarsal head still a deep probing wound with undermining. Nevertheless I elected not to debridement this this week If everything is copious static next week on the left I'm going to attempt a total contact cast 07/04/18; he is tolerating his doxycycline. The abscess on the left lateral foot is closed down although there is still a deep wound here that probes to bone. We will use silver collagen under the total contact cast Silver collagen to the deep wound over the right third metatarsal head is well 07/11/18; he is  tolerating doxycycline. The left lateral fifth plantar metatarsal head still probes deeply but I could not probe any bone. We've been using silver collagen and this will be the second week under the total contact cast Silver collagen to deep wound over the right third metatarsal head as well 07/18/18; he is still taking doxycycline. The left lateral fifth plantar metatarsal this week probes deeply with a large amount of exposed bone. Quite a deterioration. He required extensive debridement. Specimens of bone for pathology and CNS obtained Also considerable debridement on the right third metatarsal head 07/25/18; bone for pathology last week from the lateral left foot  showed osteomyelitis. CandS of the bone Enterobacter. And Klebsiella. He is now on ciprofloxacin and the doxycycline is stopped [Dr. [name] of infectious disease] area He has an appointment with Dr. [name] tomorrow I'm going to leave the total contact cast off for this week. May wish to try to reapply that next week or the week after depending on the wound bed looks. I'm not sure if there is an operative option here, previously is followed with Dr. [name] 08/01/18 on evaluation today patient presents for reevaluation. He has been seen by infectious disease, Dr. [name], and he has placed the patient back on Ceftaz currently to be given to him at dialysis three times a week. 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 Fortunately there does not appear to be the evidence of infection at this point in time. Overall the patient has been tolerating the dressing changes without complication. We are no longer due lives in the cast of left foot secondary to the infection obviously. 08/10/18 on evaluation today patient appears to be doing rather well all things considered in regard to his left plantar foot ulcer. He does have a significant callous on the lateral portion of his  left foot he wonders if I can help clean that away to some degree today. Fortunately he is having no evidence of infection at this time which is good news. No fevers chills noted. With that being said his right plantar foot actually does have a significant callous buildup around the wound opening this seems to not be doing as well as I would like at this point. I do believe that he would benefit from sharp debridement at this site. Subsequently I think a total contact cast would be helpful for the right foot as well. 08/17/18; the patient arrived today with a total contact cast on the right foot. This actually looks quite good. He had gone to see Dr. Megan Salon of infectious disease about the osteomyelitis on the left foot. I think he is on IV Fortaz at dialysis although I am not exactly sure of the rationale for the Fortaz at the time of this dictation. He also arrived in today with a swelling on the lateral left foot this is the site of his original wounds in fact when I first saw this man when he was under Dr. Ardeen Garland care I think this was the site of where the wound was located. It was not particularly tender however using a small scalpel I opened it to [place] some moderate amount of purulent drainage. 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 Last MRI was in May that showed progressive osteomyelitis and bone destruction of the cuboid and base of the fifth metatarsal. Last week he had an abscess over this same area this was removed. Culture was negative. We are continuing with a total contact cast to the area on the third metatarsal head on the right and making some good progress here. The patient asked me again about renal transplant. He is not on the list because of the open wounds. 08/31/18; apparently the patient had an interruption in the [place] but he is now back on  this at dialysis. Apparently there was a misunderstanding and Dr. Crissie Figures orders. Due to have the MRI of the left foot tonight. The area on the right  foot continues to have callus thick skin and subcutaneous tissue around the wound edge that requires constant debridement however the wound is smaller we are using a total contact cast in this area. Alginate all wounds 09/07/18; without much surprise the MRI of his left foot showed osteomyelitis in the reminiscent of his fourth metatarsal but also the third metatarsal. She arrives in clinic today with the right foot and a total contact cast. There was purulent drainage coming out of the wound which I have cultured. Marked deterioration here with undermining widely around the wound orifice. Using pickups and a scalpel I remove callus and subcutaneous tissue from a substantial new opening. In a similar fashion the area on the left lateral foot that was blistered last week I have opened this and remove skin and subcutaneous tissue from this area to expose a obvious new wound. I think there is extension and communication between all of this on the left foot. The patient is on Fortaz at dialysis. Culture done of the right foot. We are clearly not making progress here. We have made him an appointment with Dr. [name] of orthopedics. I think an amputation of the left leg may be discussed. I don't think there is anything that can be done with foot salvage. 09/14/18; culture from the right foot last time showed a very resistant MRSA. This is resistant to doxycycline. I'm going to try to get linezolid at least for a week. He does not have a appointment with Dr. [name] yet. 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 I'll send a message to Dr. [name] of infectious disease. I want the patient to see Dr. [name] to go over the pros and cons of an amputation which I think will be a BKA. The patient had a  question about whether this is curative or not. I told him that I thought it would be although spread of staph aureus infection is not unheard of. MRI of the right foot was done at the end of April 2019 did not show osteomyelitis. The patient last saw Dr. [name] on 9/3. I'll send Dr. [name] a message. I would really like him to weigh the pros and cons of a BKA on the left otherwise he'll probably need IV antibiotics and perhaps hyperbaric oxygen again. He has not had a good response to this in the past. His MRI earlier this month showed progressive damage in the remnants of the fourth and third metatarsal 09/21/2018; sees Dr. [name] of orthopedics next week to discuss a left BKA in response to the osteomyelitis in the left foot. Using silver alginate to all wound areas. He is completing the Zyvox I did put in for him after last culture showed MRSA 09/29/2018; patient saw Dr. [name] of orthopedics discussed the osteomyelitis in the left foot third and fourth metatarsals. He did not recommend urgent surgery but certainly stated the only surgical option would be a BKA. He is communicating with Dr. [name]. The problem here is the instability of the areas on his foot which constantly generate draining abscesses. I will communicate with Dr. [name] about this. He has an appointment on 10/23 10/05/2018; patient sees Dr. [name] on 10/23. I have sent him a secure message not ordered any additional antibiotics for now. Patient continues to have 2 open areas on the left plantar foot at the fifth metatarsal head and the lateral aspect of the foot. These have not changed all that  much. The area on the right third met head still has thick callus and surrounding subcutaneous tissue we have been using silver alginate 10/12/2018; patient sees Dr. [name] or colleague on 10/23. 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 The area on the right plantar  foot is just about closed. The superior wound on the left fifth metatarsal head is callused over but I am not sure if this is closed. The area below it is about the same. Considerable amount of callus on the lateral foot. We have been using silver alginate to the wounds 10/19/2018; the patient saw Dr. [name] on 10/22. He wishes to try and continue to save the left foot. He has been given 6 weeks of oral ciprofloxacin. We continue to put a total contact cast on the right foot. The area over the fifth metatarsal head on the left is callused over/perhaps healed but I did not remove the callus to find out. He still has the wound on the left lateral midfoot requiring debridement. We have been using silver alginate to all wound areas 10/26/2018 On the left foot our intake nurse noted some purulent drainage from the inferior wound [currently the only one that is not callused over] On the right foot even though he is in total contact cast a considerable amount of thick black callus and surface eschar. On this side we have been using silver alginate under a total contact cast. he remains on ciprofloxacin as prescribed by Dr. [name] 11/02/2018; culture from last week which is done of the probing area on the left midfoot wound showed MRSA "few". I am going to need to contact Dr. [name] which I will do today I think he is going to need IV antibiotics again. The area on the left foot which was callused on the side is clearly separating today all of this was removed a copious amounts of callus and necrotic subcutaneous tissue. The area on the right plantar foot actually remained healthy looking with a healthy granulated base. We are using silver alginate to all wound areas 11/09/2018; unfortunately neither 1 of the patient's wounds areas looks at all satisfactory. On the left he has considerable necrotic debris from the plantar wound laterally over the foot. I removed copious amounts of material including  callus skin and subcutaneous tissue. On the right foot he arrives with undermining and frankly purulent drainage. Specimen obtained for culture and debridement of the callused skin and subcutaneous tissue from around the circumference. Because of this I cannot put him back in a total contact cast but to be truthful we are really unfortunately not making a lot of progress I did put in a secure text message to Dr. [name] wondering about the MRSA on the left foot. He suggested vancomycin although this is not started. I was left wondering if he expected me to send this into dialysis. Has we have more purulence on the right side wait for that result before calling dialysis 11/16/2018; I called dialysis earlier this week to get the vancomycin started. I think he got the first dose on Tuesday. The patient tells me that he fell earlier this week twisting his left foot and ankle and he is swelling. He continues to not look well. He had blood cultures done at dialysis on Tuesday he is not been informed of the results. 12/07/2018; the patient was admitted to hospital from 11/22/2018 through 12/02/2018. 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 In the meantime being off the right foot this is closed over. He follows up with Dr. 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 Subsequent to this he actually had multiple falls in fact he tells me to following which calls the wound to be his apparently. He has been seeing Dr. [name] and his physician assistant in the interim. They actually did want him to come back for reevaluation to see if there's anything we can do for me wound care perspective the help this area to heal more appropriately. The patient states that he does not have a tremendous amount of pain which is good news. No fevers, chills, nausea, or vomiting noted at this time. We  have gotten approval from Dr. [name] office had [place] for [place] to treat the patient for his stop wound. This is due to the fact that the patient was in the 90 day postop global. 1/21; the patient does not have an open wound on the right foot although he does have some pressure areas that will need to be padded when he is transferring. The dehisced surgical wound looks clean although there is some undermining of 1.5 cm superiorly. 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 This appears healthy but there is undermining. TheraSkin #1 2/11; TheraSkin #2 2/25; TheraSkin #3. Wound is measuring smaller 3/10; TheraSkin #4 wound is measuring smaller 3/24; TheraSkin #5 wound is measuring smaller and looks healthy. 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 He has no open area on the right foot although he does have some callused area over the original third metatarsal head wound. The third metatarsal is also subluxed on the right foot. I have warned him today that he cannot consider wearing a prosthesis for at least a month but he can go for measurements. He is going to need to keep the right foot padded is much as possible in his diabetic shoe indefinitely READMISSION 06/12/2019 Mr. Weber is a type II diabetic on dialysis. He has been in this clinic multiple times with wounds on his bilateral lower extremities. 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 He also has a history of wounds on his right foot but tells me that everything is going well here. He actually came in here with his prosthesis walking with a cane. We were all really quite gratified to see this He states over the last several weeks he has had a painful area at the tip of the left third finger. His dialysis shunt in his is  in the left upper arm and this particularly hurts during dialysis when his fingers get numb and there is a lot of pain in the tip of his left third finger. I believe he is seen vascular surgery. He had a Doppler done to evaluate for a dialysis steal syndrome. He is going for a banding procedure 1 week from today towards the left AV fistula. I think if that is unsuccessful they will be looking at creating a new shunt. He had a course of doxycycline when he which he finished about a week ago. He has been using Bactroban to the finger 6/25; the patient had his banding procedure  and things seem to be going better. He is not having pain in his hand at dialysis. The area on the tip of his finger seems about closed. He did however have a paronychia I the last time I saw him. I have been advising him to use topical Bactroban washing the finger. Culture I did last time showed a few staph epidermidis which I was willing to dismiss as superficial skin contaminant. He arrives today with the finger wound looking better however the paronychia a seems worse 7/9; 2-week follow-up. He does not have an open wound on the tip of his third left finger. I thought he had an initial ischemic wound on the tip of the finger as well as a paronychia a medially. All of this appears to be closed. 8/6-Patient returns after 3 weeks for follow-up and has a right second met head plantar callus with a significant area of maceration hiding an ulcer ABI repeated today on right - 1.26 8/13-Patient returns at 1 week, the right second met head plantar wound appears to be slightly better, it is surrounded by the callus area we are using silver alginate 8/20; the patient came back to clinic 2 weeks ago with a wound on the right second metatarsal head. He apparently told me he developed callus in this area but also went away from his custom shoes to a pair of running shoes. Using silver alginate. He of course has the left BKA and  prosthesis on the left side. There might be much we can do to offload this although the patient tells me he has a wheelchair that he is using to try and stay off the wound is much as possible 8/27; right second metatarsal head. Still small punched out area with thick callus and subcutaneous tissue around the wound. We have been using silver collagen. This is always been an issue with this man's wound especially on this foot 9/3; right second met head. Still the same punched-out area with thick callus and subcutaneous tissue around the wound removing the circumference demonstrates repetitively undermining area. I have removed all the subcutaneous tissue associated with this. We have been using silver collagen 9/15; right second metatarsal head. Same punched-out thick callus and subcutaneous tissue around the wound area. Still requiring debridement we have been using silver collagen I changed back to silver alginate X-ray last time showed no evidence of osteomyelitis. Culture showed Klebsiella and he was started and Keflex apparently just 4 days ago 9/24; right second metatarsal head. He is completed the antibiotics I gave him for 10 days. Same punched-out thick callus and subcutaneous tissue around the area. Still requiring debridement. We have been using silver alginate. The area itself looks swollen and this could be just [place]force that comes on this area with walking on a prosthesis on the left however I think he needs an MRI and I am going to order that today. There is not an option to offload this further 10/1; right second plantar metatarsal head. MRI booked for October 6. Generally looking better today using silver alginate 10/8; right second plantar metatarsal head. MRI that was done on 10/6 was negative for osteomyelitis noted prior amputations of the second and fourth toes at the MTP. Prior amputation of the third toe to the base of the middle phalanx. Prior amputation of the fifth toe  to the head of the metatarsal. 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 He does not recall about hearing about  this. He did not have any pain but then again he has reduced sensation from diabetic neuropathy 10/15; right second plantar metatarsal head. Still in the same condition callus nonviable subcutaneous tissue which cleans up nicely with debridement but reforms by the next week. The patient tells me that he is offloading this is much as he can including taking his wheelchair to dialysis. We have been using silver alginate, changed to silver collagen today 10/22; right second plantar metatarsal head. Wound measures smaller but still thick callus around this wound. Still requiring debridement 11/5 right second plantar metatarsal head. Less callus around the wound circumference but still requiring debridement. There is still some undermining which I cleaned out the wound looks healthy but still considerable punched out depth with a relatively small circumference to the wound there is no palpable bone no purulent drainage 11/12; right second plantar metatarsal head. Small wound with thick callus tissue around the wound and significant undermining. We have been using silver collagen after my continuous debridements of this area 11/19; patient arrives in today with purulent drainage coming out of the wound in the second third met head area on the right foot. Serosanguineous thick drainage. Specimen obtained for culture. 11/21/2019 on evaluation today patient appears to be doing well with regard to his foot ulcer. The good news is is culture came back negative for any bacteria there was no growth. The other good news is his x-ray also appears to be doing well. The foot is also measuring much better than what it was. Overall I am very pleased with how things seem to be progressing. 12/22; patient was in [facility] for several weeks due to the  death of a family member. He said he stayed off the wound using silver alginate. 01/03/2020. Patient has a small open area with roughly 0.3 cm of circumferential undermining. The orifice looks smaller. We have been using silver collagen. There is not a wound more aggressive way to offload this area as he has a prosthesis on the other leg 1/21; again the same small area is 2 weeks ago. We have been using silver collagen I change that to endoform today I really believe that this is an offloading issue 2/4; the area on the right third met head. We have been using endoform. 2/11 the area on the right third met head we have been using silver alginate. 2/25; the area on the right third met head. There is much less callus on this today. The patient states he is offloading this even more aggressively. As an example he is taking his wheelchair into dialysis on most days 3/4; right third metatarsal head. Again he has eschar over the surface of the wound which I removed with a #3 curette. Some subcutaneous debris. This has 0.8 cm of direct probing depth. I cannot feel any bone here. I did do a culture the area 3/11; right third metatarsal head. Again an eschared area over the wound which almost makes this look superficial. Again debridement reveals a wound with probing depth probably not much different from last week there is no palpable bone no palpable purulence. The patient tells me that he is making every effort to offload this area properly. He is taking wheelchair into dialysis etc. There is no option for forefoot offloading or a total contact cast. We used Oasis #1 today 3/18; again callus and eschar over the wound surface. I remove this but still a probing wound although only 3 mm in depth this time. This  is an improvement 3/25; again callus and eschar over the wound surface which I removed the wound is much deeper today at 0.7 cm. There is no palpable bone. Because of the appearance of this a  culture was done. I am not going to put the Oasis back in this. Concerned about underlying infection. Notable for the fact that he is just finished doxycycline for the last go round of this Electronic Signature(s) Signed: 03/20/2020 6:03:33 PM By: Linton Ham MD Entered By: Linton Ham on 03/20/2020 15:22:25 -------------------------------------------------------------------------------- Physical Exam Details Patient Name: Date of Service: DEARION, HUOT 03/20/2020 1:00 PM Medical Record [id number] Patient Account Number: [id number] Date of Birth/Sex: Treating RN: March 24, 1951 (69 y.o. M) Primary Care Provider: [name] Other Clinician: Referring Provider: Treating Provider/Extender:Nell Gales, Jeronimo Greaves, Buena Irish in Treatment: 47 Constitutional Patient is hypotensive. However this is quite normal for him. Pulse regular and within target range for patient.Marland Kitchen Respirations regular, non-labored and within target range.. Temperature is normal and within the target range for the patient.Marland Kitchen Appears in no distress. Notes Wound exam; callus and subcutaneous debris around the wound edge. I remove the callus. Unfortunately the depth on this is more than doubled. There is no purulence however I did a swab culture. Electronic Signature(s) Signed: 03/20/2020 6:03:33 PM By: Linton Ham MD Entered By: Linton Ham on 03/20/2020 15:23:15 -------------------------------------------------------------------------------- Physician Orders Details Patient Name: Date of Service: BRADDOCK, SERVELLON 03/20/2020 1:00 PM Medical Record [id number] Patient Account Number: [id number] Date of Birth/Sex: Treating RN: Oct 25, 1951 (69 y.o. 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 - to see Margarita Grizzle. Dressing Change Frequency Wound #41 Right Metatarsal head second Change dressing every day. Wound Cleansing May shower and wash wound with soap and water. Primary Wound Dressing Wound #41 Right Metatarsal head second Calcium Alginate with Silver - pack into wound bed. Secondary Dressing Wound #41 Right Metatarsal head second Foam - foam donut Kerlix/Rolled Gauze Dry Gauze Other: - felt to periwound and place one below wound. lightly wrap coban to hold dressing in place. felt / pad wound Edema Control Avoid standing for long periods of time Elevate legs to the level of the heart or above for 30 minutes daily and/or when sitting, a frequency of: - throughout the day. Off-Loading Other: - felt to periwound and place one below wound. Additional Orders / Instructions Other: - minimize walking and standing on foot to aid in wound healing and callus buildup. Laboratory Bacteria identified in Unspecified specimen by Anaerobe culture (MICRO) - someone will call you with any abnormal results. - (ICD10 E11.621 - Type 2 diabetes mellitus with foot ulcer) LOINC Code: 606-3 Convenience Name: Anerobic culture Electronic Signature(s) Signed: 03/20/2020 6:03:33 PM By: Linton Ham MD Signed: 03/20/2020 6:22:19 PM By: Deon Pilling Entered By: Deon Pilling on 03/20/2020 13:59:15 -------------------------------------------------------------------------------- Problem List Details Patient Name: Date of Service: RANDON, SOMERA 03/20/2020 1:00 PM Medical Record [id number] Patient Account Number: [id number] Date of Birth/Sex: Treating RN: [birth year] (69  y.o. 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, 06/12/2019 No Yes other extremity L03.115 Cellulitis of right lower limb 11/15/2019 No Yes Inactive Problems ICD-10 Code Description Active Date Inactive Date L03.114 Cellulitis of left upper limb 06/12/2019 06/12/2019 L98.498 Non-pressure chronic ulcer of skin of other sites with other 06/12/2019 06/12/2019 specified severity S61.209D Unspecified open wound of unspecified finger without damage 06/12/2019 06/12/2019 to nail, subsequent encounter Resolved Problems Electronic Signature(s) Signed: 03/20/2020 6:03:33 PM By: Linton Ham MD Entered By: Linton Ham on 03/20/2020 15:19:50 -------------------------------------------------------------------------------- Progress Note Details Patient Name: Date of Service: [name] 03/20/2020 1:00 PM Medical Record [id number] Patient Account Number: [id number] Date of Birth/Sex: Treating RN: [birth year] (69 y.o. M) Primary Care Provider: [name] Other Clinician: Referring Provider: Treating Provider/Extender:Jenasia Dolinar, Jeronimo Greaves, Buena Irish in Treatment: 40 Subjective History of Present Illness (HPI) The following HPI elements were documented for the patient's wound: Location: Patient presents with a wound to bilateral feet. Quality: Patient reports experiencing essentially no pain. Severity: Mildly severe wound with no evidence of infection Duration: Patient has had the wound for greater than 2 weeks prior to presenting  for treatment The patient is a pleasant 69 yrs old bm here for evaluation of ulcers on the plantar aspect of both feet. 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 He has a left arm graft for access. He has been trying to stay off his feet for weeks but does not seem to have any improvement in the wound. He has been seeing someone at the foot center and was referred to the Wound Care center for further evaluation. 12/30/15 the patient has 3 wounds one over the right first metatarsal head and 2 on the left foot at the left fifth and lleft first metatarsal head. All of these look relatively similar. The one over the left fifth probe to bone I could not prove that any of the others did. I note his MRI in November that did not show osteomyelitis. His peripheral pulses seem robust. All of these underwent surgical debridement to remove callus nonviable skin and subcutaneous tissue 01/06/16; the patient had his sutures removed from the right fifth ray amputation. There may be a small open part of this superiorly but otherwise the incision looks good. Areas over his right first, left first and left fifth metatarsal head all underwent surgical debridement as a varying degree of callus, skin and nonviable subcutaneous tissue. The area that is most worrisome is the right fifth metatarsal head which has a wound probes precariously close to bone. There is no purulent drainage or erythema 01/13/16; I'm not exactly sure of the status of the right fifth ray amputation site however he follows with Dr. [name] later this week. The area over his right first plantar metatarsal head, left first and fifth plantar metatarsal head are all in the same status. Thick circumferential callus, nonviable subcutaneous tissue. Culture of the left fifth did not culture last week 01/20/16; all of the patient's wounds appear and roughly the same state although his amputation site on the  right lateral foot looks better. His wounds over the right first, left first and left fifth metatarsal heads all underwent difficult surgical debridement removing circumferential callus nonviable skin and subcutaneous tissue. There  is no overt evidence of infection in these areas. MRI at the end of December of the left foot did not show osteomyelitis, right foot showed osteomyelitis of the right fifth digit he is now status post amputation. 01/27/16 the patient's wounds over his plantar first and fifth metatarsal heads on the left all appear better having been started on a total contact cast last week. They were debridement of circumferential callus and nonviable subcutaneous tissue as was the wound over the first metatarsal head. His surgical incision on the right also had some light surface debridement done. 03/23/2016 -- the patient was doing really well and most of his wounds had almost completely healed but now he came back today with a history of having a discharge from the area of his right foot on the plantar aspect and also between his first and second toe. He also has had some discharge from the left foot. Addendum: I spoke to the PA [name] at the dialysis center whose fax number is [phone]. We discussed the infection the patient has and she will put the patient on vancomycin and Fortaz until the final culture report is back. We will fax this as soon as available. 03/30/2016 -- left foot x-ray IMPRESSION:No definitive osteomyelitis noted. X-ray of the right foot -- IMPRESSION: 1. Soft tissue swelling. Prior amputation right fifth digit. No acute or focal bony abnormality identified. If osteomyelitis remains a clinical concern, MRI can be obtained. 2. Peripheral vascular disease. His culture reports have grown an MSSA -- and we will fax this report to his hemodialysis center. He was called in a prescription of oral doxycycline but I have told her not to fill this in as he  is already on IV antibiotics. 04/06/2016 -- a few days ago, I spoke to the hemodialysis center nurse who had stopped the IV antibiotics and he was given a prescription for doxycycline 100 mg by mouth twice a day for a week and he is on this at the present time. 05/11/2016 -- he has recently seen his PCP this week and his hemoglobin A1c was 7. He is working on his paperwork to get his orthotic shoes. 05/25/2016 -- -- x-ray of the left foot IMPRESSION:No acute bony abnormality. No radiographic changes of acute osteomyelitis. No change since prior study. X-ray of the right foot -- IMPRESSION: Postsurgical changes are seen involving the fifth toe. No evidence of acute osteomyelitis. he developed a large blister on the medial part of his right foot and this opened out and drain fluid. 06/01/2016 -- he has his MRI to be done this afternoon. 06/15/2016 -- MRI of the left forefoot without contrast shows 2 separate regions of cutaneous and subcutaneous edema and possible ulceration and blistering along the ball of the foot. No obvious osteomyelitis identified. MRI of the right foot showed cutaneous and subcutaneous thickening plantar to the first digit sesamoid with an ulcer crater but no underlying osteomyelitis is identified. 06/22/2016 -- the right foot plantar ulcer has been draining a lot of seropurulent material for the last few days. 06/30/2016 -- spoke to the dialysis center and I believe I spoke to [employer] a PA at the center who discussed with me and agreed to putting Legrand Como on vancomycin until his cultures arrive. On review of his culture report no WBCs were seen or no organisms were seen and the culture was reincubated for better growth. The final report is back and there were no predominant growth including Streptococcus or Staphylococcus. Clinically though he has  a lot of drainage from both wounds a lot of undermining and there is further blebs on the left foot towards the interspace  between his first and second toe. 08/10/2016 -- the culture from the right foot showed normal skin flora and there was no Staphylococcus aureus was group A streptococcus isolated. 09/21/2016 -- -- MRI of the right foot was done on 09/13/2016 - IMPRESSION: Findings most consistent with acute osteomyelitis throughout the great toe, sesamoid bones and plantar aspect of the head of the first metatarsal. Fluid in the sheath of the flexor tendon of the great toe could be sympathetic but is worrisome for septic tenosynovitis. First MTP joint effusion worrisome for septic joint. He was admitted to the hospital on 09/11/2016 and treated for a fever with vancomycin and cefepime. He was seen by Dr. Bobby Rumpf of infectious disease who recommended 6 weeks treatment with vancomycin and ceftazidime with his hemodialysis and to continue to see as in the wound clinic. Vascular consult was pending. The patient was discharged home on 09/14/2016 and he would continue with IV antibiotics for 6 weeks. 09/28/16 wound appears reasonably healthy. Continuing with total contact cast 10/05/16 wound is smaller and looking healthy. Continue with total contact cast. He continues on IV antibiotics 10/12/2016 -- he has developed a new wound on the dorsal aspect of his left big toe and this is a superficial injury with no surrounding cellulitis. He has completed 30 days of IV antibiotics and is now ready to start his hyperbaric oxygen therapy as per his insurance company's recommendation. 10/19/2016 -- after the cast was removed on the right side he has got good resolution of his ulceration on the right plantar foot. he has a new wound on the left plantar foot in the region of his fourth metatarsal and this will need sharp debridement. 10/26/2016 -- Xray of the right foot complete: IMPRESSION: Changes consistent with osteomyelitis involving the head of the first metatarsal and base of the first proximal phalanx. The  sesamoid bones are also likely involved given their positioning. 11/02/2016 -- there still awaiting insurance clearance for his hyperbaric oxygen therapy and hopefully he will begin treatment soon. 11/09/2016 -- started with hyperbaric oxygen therapy and had some barotrauma to the right ear and this was seen by ENT who was prescribed Afrin drops and would probably continue with HBO and he is scheduled for myringotomy tubes on Friday 11/16/2016 -- he had his myringotomy tubes placed on Friday and has been doing much better after that with some fluid draining out after hyperbaric treatment today. The pain was much better. 11/30/2016 -- over the last 2 days he noticed a swelling and change of color of his right second toe and this had been draining minimal fluid. 12/07/2016 -- x-ray of the right foot -- IMPRESSION:1. Progressive ulceration at the distal aspect of the second digit with significant soft tissue swelling and osseous changes in the distal phalanx compatible with osteomyelitis. 2. Chronic osteomyelitis at the first MTP joint. 3. Ulcerations at the second and third toes as well without definite osseous changes. Osteomyelitis is not excluded. 4. Fifth digit amputation. On 12/01/2016 oo I spoke to Dr. Bobby Rumpf, the infectious disease specialist, who kindly agreed to treat this with IV antibiotics and he would call in the order to the dialysis center and this has been discussed in detail with the patient who will make the appropriate arrangements. The patient will also book an appointment as soon as possible to see Dr. [name] in the  office. Of note the patient has not been on antibiotics this entire week as the dialysis center did not receive any orders from Dr. [name]. I got in touch with Dr. [name] who tells me the patient has an appointment to see him this coming Wednesday. 12/14/2016 -- the patient is on ceftazidime and vancomycin during his dialysis, and I understand this  was put on by his nephrologist Dr. Raliegh Ip possibly after speaking with infectious disease Dr. [name] 12/23/16; patient was on my schedule today for a wound evaluation as he had difficulties with his schedule earlier this week. I note that he is on vancomycin and ceftazidine at dialysis. In spite of this he arrives today with a new wound on the base of the right second toe this easily probes to bone. The known wound at the tip of the second toe With this area. He also has a superficial area on the medial aspect of the third toe on the side of the DIP. This does not appear to have much depth. The area on the plantar left foot is a deep area but did not probe the bone 12/28/16 -- he has brought in some lab work and the most recent labs done showed a hemoglobin of 12.1 hematocrit of 36.3, neutrophils of 51% WBC count of 5.6, BN of 53, albumin of 4.1 globulin of 3.7, vancomycin 13 g per mL. 01/04/2017 -- he saw Dr. [name] who has recommended a amputation of the right second toe and he is awaiting this date. He sees Dr. Bobby Rumpf of infectious disease tomorrow. The bone culture taken on 12/28/2016 had no growth in 2 days. 01/11/2017 -- was seen by Dr. Bobby Rumpf regarding the management and has recommended a eval by vascular surgeons. He recommended to continue the antibiotics during his hemodialysis. Xray of the left foot -- IMPRESSION: No acute fracture, dislocation, or osseous erosion identified. 01/18/2017 -- he has his vascular workup later today and he is going to have his right foot second toe amputation this coming Friday by Dr. [name]. We have also put in for a another 30 treatments with hyperbaric oxygen therapy 01/25/2017 -- I have reviewed Dr. Donnetta Hutching is vascular report from last week where he reviewed him and thought his vascular function was good enough to heal his amputation site and no further tests were recommended. He also had his orthopedic related to surgery which is still  pending the notes and we will review these next week. 02/01/2017 -- the operative remote of Dr. 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 02/08/2017 --he was seen by Dr. [name] on 02/03/2017 -- patient is on [place]. after a thorough review he had recommended to continue with antibiotics during hemodialysis. The patient was seen by Dr. [name], but I do not find any follow-up note on the electronic medical record. he has removed the dressing over the right foot to remove the sutures and asked him to see him back in a month's time 02/22/2017 -- patient has been febrile and has been having symptoms of the upper respiratory tract infection but has not been checked for the flu and it's been over 4 days now. He says he is feeling  better today. Some drainage between his left first and second toe and this needed to be looked at 03/08/2017 -- was seen by Dr. Bobby Rumpf on 03/07/2017 after review he will stop his antibiotics and see him back in a month to see if he is feeling well. 03/15/2017 -- he has completed his course of hyperbaric oxygen therapy and is doing fine with his health otherwise. 03/22/2017 -- his nutritionist at the dialysis center has recommended a protein supplement to help build his collagen and we will prescribe this for him when he has the details 05/03/2017 -- he recently noticed the area on the plantar aspect of his fifth metatarsal head which opened out and had minimal drainage. 05/10/2017 -- -- x-ray of the left foot -- IMPRESSION:1. No convincing conventional radiographic evidence of active osteomyelitis.2.  Active soft tissue ulceration at the tip of the fifth digit, and at the lateral aspect of the foot adjacent the base of the fifth metatarsal. 3. Surgical changes of prior fourth toe amputation. 4. 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 5. Small vessel atherosclerotic vascular calcifications. 6. 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 ======= Readmission after 5 weeks: 06/15/2017 -- the patient returns after 5 weeks having had an MRI done on 05/17/2017 MRI of the left foot without contrast showed soft tissue ulcer overlying the base of the fifth metatarsal. 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 No drainable fluid collection to suggest an abscess. He was in hospital between 05/16/2017 and 05/25/2017 -- and on discharge was asked to follow-up with Dr. [name] and Dr. [name]. He was treated 2 weeks post discharge with vancomycin plus ceftriaxone with hemodialysis and oral metronidazole 500 mg 3 times a day. 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 last hemoglobin A1c was 6.7% He had a postoperative MR of the foot on 05/23/2017 -- which showed no new areas of cortical bone loss, edema or soft tissue ulceration to suggest osteomyelitis. the patient has completed his course of IV antibiotics during dialysis and is to see the infectious disease doctor tomorrow. Dr. [name] had seen him after suture removal and asked him to keep the wound with a dry dressing 06/21/2017 -- he was seen by Dr. Bobby Rumpf of infectious disease on 06/16/2017 -- he stopped his Flagyl and will see him back in 6 weeks. He was asked to follow-up with Dr. [name] and with the wound clinic clinic. 07/05/17; bone biopsy from last time showed acute osteomyelitis. This is from the reminiscent in the left  fifth metatarsal. Culture result is apparently still pending [holding for anaerobe}. From my understanding in this case this is been a progressive necrotic wound which is deteriorated markedly over the last 3 weeks since he returned here. He now has a large area of exposed bone which was biopsied and cultured last week. Dr. Graylon Good has put him on vancomycin and Fortaz during his hemodialysis and Flagyl orally. He is to see Dr. [name] next week 07/19/2017 -- the patient was reviewed by Dr. [name] of orthopedics who reviewed the case in detail and agreed with the plan to continue with IV antibiotics, aggressive wound care and hyperbaric oxygen therapy. He would see him back in 3 weeks' time 08/09/2017 -- saw Dr. Bobby Rumpf on 08/03/2017 -he was restarted on his antibiotics for 6 weeks which included vanco,  ceftaz and Flagyl. he recommended continuing his antibiotics for 6 weeks and reevaluate his completion date. He would continue with wound care and hyperbaric oxygen therapy. 08/16/2017 -- I understand he will be completing his 6 weeks of antibiotics sometime later this week. 09/19/2017 -- he has been without his wound VAC for the last week due to lack of supplies. He has been packing his wound with silver alginate. 10/04/2017 -- he has an appointment with Dr. [name] tomorrow and hence we will not apply the wound VAC after his dressing changes today. 10/11/2017 -- he was seen by Dr. Bobby Rumpf on 10/05/2017, noted that the patient was currently off antibiotics, and after thorough review he recommended he follow-up with the wound care as per plan and no further antibiotics at this point. 11/29/17; patient is arrived for the wound on his left lateral foot.Marland Kitchen He is completed IV antibiotics and 2 rounds of hyperbaric oxygen for treatment of underlying osteomyelitis. He arrives today with a surface on most of the wound area however our intake nurse noted drainage from the superior aspect.  This was brought to my attention. 12/13/2017 -- the right plantar foot had a large bleb and once the bleb was opened out a large callus and subcutaneous debris was removed and he has a plantar ulcer near the fourth metatarsal head 12/28/17 on evaluation today patient appears to be doing acutely worse in regard to his left foot. The wound which has been appearing to do better as now open up more deeply there is bone palpable at the base of the wound unfortunately. He tells me that this feels "like it did when he had osteomyelitis previously" he also noted that his second toe on the left foot appears to be doing worse and is swollen there does appear to be some fluid collected underneath. His right foot plantar ulcer appears to be doing somewhat better at this point and there really is no complication at the site currently. No fevers, chills, nausea, or vomiting noted at this time. Patient states that he normally has no pain at this site however. Today he is having significant pain. 01/03/18; culture done last week showed methicillin sensitive staph aureus and group B strep. He was on Septra however he arrives today with a fever of 101. He has a functional dialysis shunt in his left arm but has had no pain here. No cough. He still makes some urine no dysuria he did have abdominal pain nausea and diarrhea over the weekend but is not had any diarrhea since yesterday. Otherwise he has no specific complaints 01/05/18; the patient returns today in follow-up for his presentation from 1/8. At that point he was febrile. I gave him some Levaquin adjusted for his dialysis status. He tells me the fever broke that night and it is not likely that this was actually a wound infection. He is gone on to have an MRI of the left foot; this showed interval development of an abnormal signal from the base of the third and fourth metatarsal and in the cuboid reminiscent consistent with osteomyelitis. There is no mention of the  left second toe I think which was a concern when it was ordered. The patient is taking his Levaquin 01/10/18; the patient has completed his antibiotics today. The area on the left lateral foot is smaller but it still probes easily to bone. He has underlying osteomyelitis here and sees Dr. Megan Salon of infectious disease this week ooThe area on the right third plantar metatarsal head  still requires debridement not much change in dimensions [facility] has a new wound on the medial tip of his right third toe again this probes to bone. He only noticed this 2 days ago 01/17/18; the patient saw Dr. [name] last week and he is back on Vanco and Fortaz at dialysis. 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 We are previously seeing him for a wound also on the base of the fourth med head on the right. X- ray of this area did not show osteomyelitis in relation to a new wound on the tip of his right third toe. Again this probes to bone. Finally his second toe on the left which didn't really show anything on the MRI at least no report appears to have a separated cutaneous area which I think is going to come right off the tip of his toe and leave exposed bone. Whether this is infectious or ischemic I am not clear Culture I did from the third toe last week which was his new wound was negative. Plain x-ray on the right foot did not show osteomyelitis in the toes 01/24/18; the patient is going went to see Dr. [name]. He is going to have a amputation of the left second and right third toe although paradoxically the left second toe looks better than last week. We have treating a deep probing wound on the left lateral foot and the area over the third metatarsal head on the right foot. 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 Also a debridement including bone of the left lateral  foot and a closure which is still sutured. This is a bit surprising. Also apparent debridement of the base of the right third toe wound. 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 I'm not sure exactly where we are in the frame of things here. The wound on the left lateral foot is still sutured also the amputation sites of the left second and right third toe. He is been using Xeroform to the sutured areas silver alginate to the right foot 02/14/18; he continues on Rocephin at dialysis for osteomyelitis. I still don't have a good sense of where we are in the treatment duration. The wounds on the left lateral foot had the sutures removed and this clearly still probes deeply. We debrided the area with a #5 curet. We'll use silver alginate to the wound oothe area over the right third met head also required debridement of callus skin and subcutaneous tissue and necrotic debris over the wound surface. This tunnels superiorly but I did not unroofed this today. ooThe areas on the tip of his toes have a crusted surface eschar I did not debridement this either. 02/21/18; he continues on Rocephin at dialysis for osteomyelitis. I don't have a good sense of where we are and the treatment duration. The wounds on the left lateral foot is callused over on presentation but requires debridement. The area on the right third med head plantar aspect actually is measuring smaller 02/28/18;patient is on Vanco and Fortaz at dialysis, I'm not sure why I thought this was Rocephin above unless it is been changed. I don't have  a good sense of time frame here. Using silver collagen to the area on the left lateral foot and right third med head plantar foot 03/07/18; patient is completing bank and Fortaz at dialysis soon. He is using  Silver collagen to the area on the left lateral foot and the right third metatarsal head. He has a smaller superficial wound distal to the left lateral foot wound. 03/14/18-he is here in follow-up evaluation for multiple ulcerations to his bilateral feet. He presents with a new ulceration to the plantar aspect of the left foot underneath the blister, this was deroofed to reveal a partial thickness ulcer. He is voicing no complaints or concerns, tolerated dialysis yesterday. We will continue with same treatment plan and he will follow-up next week 03/21/18; the patient has a area on the lateral left foot which still has a small probing area. The overall surface area of the wound is better. He presented with a new ulceration on the left foot plantar fifth metatarsal head last week. The area on the right third metatarsal head appears smaller.using silver alginate to all wound areas. The patient is changing the dressing himself. He is using a Darco forefoot offloading are on the right and a healing sandal on the left 03/28/18; original wound left lateral foot. Still nowhere close to looking like a heeling surface oosecondary wound on the left lateral foot at the base of his question fifth metatarsal which was new last week ooRight third metatarsal head. ooWe have been using silver alginate all wounds 04/04/18; the original wound on the left lateral foot again heavy callus surrounding thick nonviable subcutaneous tissue all requiring debridement. The new wound from 2 weeks ago just near this at the base of the fifth metatarsal head still looks about the same ooUnfortunately there is been deterioration in the third medical head wound on the right which now probes to bone. I must say this was a superficial wound at one point in time and I really don't have a good frame of reference year. I'll have to go back to look through records about what we know about the right foot. He is been previously treated  for osteomyelitis of the left lateral foot and he is completing antibiotics vancomycin and Fortaz at dialysis. He is also been to see Dr. [name]. Somewhere in here as somebody is ordered a VASCULAR evaluation which was done on 03/22/18. On the right is anterior tibial artery was monophasic triphasic at the posterior tibial artery. On the left anterior tibial artery monophasic posterior tibial artery monophasic. ABI in the right was 1.43 on the left 1.21. TBIs on the right at 0.41 and on the left at 0.32. A vascular consult was recommended and I think has been arranged. 04/11/18; Mr. Caraway has not had an MRI of the right foot since 2017. Recent x-ray of the right foot done in January and February was negative. However he has had a major deterioration in the wound over the third met head. He is completed antibiotics last week at dialysis [place]. I think he is going to see vascular in follow-up. The areas on the left lateral foot and left plantar fifth metatarsal head both look satisfactory. I debrided both of these areas although the tissue here looks good. The area on the left lateral foot once probe to bone it certainly does not do that now 04/18/18; MRI of the right foot is on Thursday. He has what looks to be serosanguineous purulent drainage coming out of the  wound over the right third metatarsal head today. He completed antibiotics bank and Fortaz 2 weeks ago at dialysis. He has a vascular follow-up with regards to his arterial insufficiency although I don't exactly see when that is booked. The areas on the left foot including the lateral left foot and the plantar left fifth metatarsal head look about the same. 04/25/18-He is here in follow-up evaluation for bilateral foot ulcers. MRI obtained was negative for osteomyelitis. Wound culture was negative. We will continue with same treatment plan he'll follow-up next week 05/02/18; the right plantar foot wound over the third metatarsal head  actually looks better than when I last saw this. His MRI was negative for osteomyelitis wound culture was negative. ooOn the left plantar foot both wounds on the plantar fifth metatarsal head and on the lateral foot both are covered in a very hard circumferential callus. 05/09/18; right plantar foot wound over the third metatarsal head stable from last week. ooLeft plantar foot wound over the fifth metatarsal head also stable but with callus around both wound areas ooThe area on the left lateral foot had thick callus over a surface and I had some thoughts about leaving this intact however it felt boggy. ooWe've been using silver alginate all wounds 05/16/18; since the patient was last here he was hospitalized from 5/15 through 5/19. 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 This grew MRSA. He was placed on vancomycin.MRI of the left foot suggested "progressive" osteomyelitis and bone destruction of the cuboid and the base of the fifth metatarsal. Stable erosive changes of the base of the second metatarsal. I'm wondering if they're aware that he had surgery and debridement in the area of the underlying bone previously by Dr. [name]. In any case, He was also revascularized with an angioplasty and stenting of the left tibial peroneal trunk and angioplasty of the left posterior tibial artery. He is on vancomycin at dialysis. He has a 2 week follow-up with infectious disease. He has vascular surgery follow-up. He was started on Plavix. 05/23/18; the patient's wound on the lateral left foot at the level of the fifth metatarsal head and the plantar wound on the plantar fifth metatarsal head both look better. The area on the right third metatarsal head still has depth and undermining. We've been using silver alginate. He is on vancomycin. He has been revascularized on the left 05/30/18; he continues  on vancomycin at dialysis. Revascularized on the left. The area on the left lateral foot is just about closed. Unfortunately the area over the plantar fifth metatarsal head undermining superiorly as does the area over the right third metatarsal head. Both of these significantly deteriorated from last week. He has appointments next week with infectious disease and orthopedics I delayed putting on a cast on the left until those appointments which therefore we made we'll bring him in on Friday the 14th with the idea of a cast on the left foot 06/06/18; he continues on vancomycin at dialysis. Revascularized on the left. He arrives today with a ballotable swelling just above the area on the left lateral foot where his previous wound was. He has no pain but he is reasonably insensate. The difficult areas on the plantar left fifth metatarsal head looks stable whereas the area on the right third plantar metatarsal head about the same as last week there is undermining here although I did not unroofed  this today. He required a considerable debridement of the swelling on the left lateral foot area and I unroofed an abscess with a copious amount of brown purulent material which I obtained for culture. I don't believe during his recent hospitalization he had any further imaging although I need to review this. Previous cultures from this area done in this clinic showed MRSA, I would be surprised if this is not what this is currently even though he is on vancomycin. 06/13/18; he continues on vancomycin at dialysis however he finishes this on Sunday and then graduates the doxycycline previously prescribed by Dr. [name]. The abscess site on the lateral foot that I unroofed last time grew moderate amounts of methicillin-resistant staph aureus that is both vancomycin and tetracycline sensitive so we should be okay from that regard. He arrives today in clinic with a connection between the abscess site and the area on  the lateral foot. We've been using silver alginate to all his wound areas. The right third metatarsal head wound is measuring smaller. Using silver alginate on both wound areas 06/20/18; transitioning to doxycycline prescribed by Dr. [name]. The abscess site on the lateral foot that I unroofed 2 weeks ago grew MRSA. That area has largely closed down although the lateral part of his wound on the fifth metatarsal head still probes to bone. I suspect all of this was connected. ooOn the right third metatarsal head smaller looking wound but surrounded by nonviable tissue that once again requires debridement using silver alginate on both areas 06/27/18; on doxycycline 100 twice a day. The abscess on the lateral foot has closed down. Although he still has the wound on the left fifth metatarsal head that extends towards the lateral part of the foot. The most lateral part of this wound probes to bone ooRight third metatarsal head still a deep probing wound with undermining. Nevertheless I elected not to debridement this this week ooIf everything is copious static next week on the left I'm going to attempt a total contact cast 07/04/18; he is tolerating his doxycycline. The abscess on the left lateral foot is closed down although there is still a deep wound here that probes to bone. We will use silver collagen under the total contact cast Silver collagen to the deep wound over the right third metatarsal head is well 07/11/18; he is tolerating doxycycline. The left lateral fifth plantar metatarsal head still probes deeply but I could not probe any bone. We've been using silver collagen and this will be the second week under the total contact cast ooSilver collagen to deep wound over the right third metatarsal head as well 07/18/18; he is still taking doxycycline. The left lateral fifth plantar metatarsal this week probes deeply with a large amount of exposed bone. Quite a deterioration. He required extensive  debridement. Specimens of bone for pathology and CNS obtained ooAlso considerable debridement on the right third metatarsal head 07/25/18; bone for pathology last week from the lateral left foot showed osteomyelitis. CandS of the bone Enterobacter. And Klebsiella. He is now on ciprofloxacin and the doxycycline is stopped [Dr. [name] of infectious disease] area He has an appointment with Dr. [name] tomorrow I'm going to leave the total contact cast off for this week. May wish to try to reapply that next week or the week after depending on the wound bed looks. I'm not sure if there is an operative option here, previously is followed with Dr. [name] 08/01/18 on evaluation today patient presents for reevaluation. He has been  seen by infectious disease, Dr. [name], and he has placed the patient back on Ceftaz currently to be given to him at dialysis three times a week. 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 Fortunately there does not appear to be the evidence of infection at this point in time. Overall the patient has been tolerating the dressing changes without complication. We are no longer due lives in the cast of left foot secondary to the infection obviously. 08/10/18 on evaluation today patient appears to be doing rather well all things considered in regard to his left plantar foot ulcer. He does have a significant callous on the lateral portion of his left foot he wonders if I can help clean that away to some degree today. Fortunately he is having no evidence of infection at this time which is good news. No fevers chills noted. With that being said his right plantar foot actually does have a significant callous buildup around the wound opening this seems to not be doing as well as I would like at this point. I do believe that he would benefit from sharp debridement at this site. Subsequently I think a total contact cast  would be helpful for the right foot as well. 08/17/18; the patient arrived today with a total contact cast on the right foot. This actually looks quite good. He had gone to see Dr. Megan Salon of infectious disease about the osteomyelitis on the left foot. I think he is on IV Fortaz at dialysis although I am not exactly sure of the rationale for the Fortaz at the time of this dictation. He also arrived in today with a swelling on the lateral left foot this is the site of his original wounds in fact when I first saw this man when he was under Dr. Ardeen Garland care I think this was the site of where the wound was located. It was not particularly tender however using a small scalpel I opened it to [place] some moderate amount of purulent drainage. 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 Last MRI was in May that showed progressive osteomyelitis and bone destruction of the cuboid and base of the fifth metatarsal. Last week he had an abscess over this same area this was removed. Culture was negative. ooWe are continuing with a total contact cast to the area on the third metatarsal head on the right and making some good progress here. The patient asked me again about renal transplant. He is not on the list because of the open wounds. 08/31/18; apparently the patient had an interruption in the [place] but he is now back on this at dialysis. Apparently there was a misunderstanding and Dr. Crissie Figures orders. Due to have the MRI of the left foot tonight. ooThe area on the right foot continues to have callus thick skin and subcutaneous tissue around the wound edge that requires constant debridement however the wound is smaller we are using a total contact cast in this area. Alginate all wounds 09/07/18; without much surprise the MRI of his left foot showed osteomyelitis in the reminiscent of  his fourth metatarsal but also the third metatarsal. She arrives in clinic today with the right foot and a total contact cast. There was purulent drainage coming out of the wound which I have cultured. Marked deterioration here with  undermining widely around the wound orifice. Using pickups and a scalpel I remove callus and subcutaneous tissue from a substantial new opening. ooIn a similar fashion the area on the left lateral foot that was blistered last week I have opened this and remove skin and subcutaneous tissue from this area to expose a obvious new wound. I think there is extension and communication between all of this on the left foot. ooThe patient is on Fortaz at dialysis. Culture done of the right foot. We are clearly not making progress here. ooWe have made him an appointment with Dr. [name] of orthopedics. I think an amputation of the left leg may be discussed. I don't think there is anything that can be done with foot salvage. 09/14/18; culture from the right foot last time showed a very resistant MRSA. This is resistant to doxycycline. I'm going to try to get linezolid at least for a week. He does not have a appointment with Dr. [name] yet. 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 I'll send a message to Dr. [name] of infectious disease. I want the patient to see Dr. [name] to go over the pros and cons of an amputation which I think will be a BKA. The patient had a question about whether this is curative or not. I told him that I thought it would be although spread of staph aureus infection is not unheard of. MRI of the right foot was done at the end of April 2019 did not show osteomyelitis. The patient last saw Dr. [name] on 9/3. I'll send Dr. [name] a message. I would really like him to weigh the pros and cons of a BKA on the left otherwise he'll probably need IV antibiotics and perhaps hyperbaric oxygen again.  He has not had a good response to this in the past. His MRI earlier this month showed progressive damage in the remnants of the fourth and third metatarsal 09/21/2018; sees Dr. [name] of orthopedics next week to discuss a left BKA in response to the osteomyelitis in the left foot. Using silver alginate to all wound areas. He is completing the Zyvox I did put in for him after last culture showed MRSA 09/29/2018; patient saw Dr. [name] of orthopedics discussed the osteomyelitis in the left foot third and fourth metatarsals. He did not recommend urgent surgery but certainly stated the only surgical option would be a BKA. He is communicating with Dr. [name]. The problem here is the instability of the areas on his foot which constantly generate draining abscesses. I will communicate with Dr. [name] about this. He has an appointment on 10/23 10/05/2018; patient sees Dr. [name] on 10/23. I have sent him a secure message not ordered any additional antibiotics for now. Patient continues to have 2 open areas on the left plantar foot at the fifth metatarsal head and the lateral aspect of the foot. These have not changed all that much. The area on the right third met head still has thick callus and surrounding subcutaneous tissue we have been using silver alginate 10/12/2018; patient sees Dr. [name] or colleague on 10/23. 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 The area on the right plantar foot is just about closed. The superior wound on the left fifth metatarsal head is callused over but I am not sure if this is closed. The area below it is about the same. Considerable  amount of callus on the lateral foot. We have been using silver alginate to the wounds 10/19/2018; the patient saw Dr. [name] on 10/22. He wishes to try and continue to save the left foot. He has been given 6 weeks of oral ciprofloxacin. We continue to put a total contact  cast on the right foot. The area over the fifth metatarsal head on the left is callused over/perhaps healed but I did not remove the callus to find out. He still has the wound on the left lateral midfoot requiring debridement. We have been using silver alginate to all wound areas 10/26/2018 ooOn the left foot our intake nurse noted some purulent drainage from the inferior wound [currently the only one that is not callused over] ooOn the right foot even though he is in total contact cast a considerable amount of thick black callus and surface eschar. On this side we have been using silver alginate under a total contact cast. oohe remains on ciprofloxacin as prescribed by Dr. [name] 11/02/2018; culture from last week which is done of the probing area on the left midfoot wound showed MRSA "few". I am going to need to contact Dr. [name] which I will do today I think he is going to need IV antibiotics again. The area on the left foot which was callused on the side is clearly separating today all of this was removed a copious amounts of callus and necrotic subcutaneous tissue. ooThe area on the right plantar foot actually remained healthy looking with a healthy granulated base. ooWe are using silver alginate to all wound areas 11/09/2018; unfortunately neither 1 of the patient's wounds areas looks at all satisfactory. On the left he has considerable necrotic debris from the plantar wound laterally over the foot. I removed copious amounts of material including callus skin and subcutaneous tissue. On the right foot he arrives with undermining and frankly purulent drainage. Specimen obtained for culture and debridement of the callused skin and subcutaneous tissue from around the circumference. Because of this I cannot put him back in a total contact cast but to be truthful we are really unfortunately not making a lot of progress I did put in a secure text message to Dr. [name] wondering about the  MRSA on the left foot. He suggested vancomycin although this is not started. I was left wondering if he expected me to send this into dialysis. Has we have more purulence on the right side wait for that result before calling dialysis 11/16/2018; I called dialysis earlier this week to get the vancomycin started. I think he got the first dose on Tuesday. The patient tells me that he fell earlier this week twisting his left foot and ankle and he is swelling. He continues to not look well. He had blood cultures done at dialysis on Tuesday he is not been informed of the results. 12/07/2018; the patient was admitted to hospital from 11/22/2018 through 12/02/2018. He underwent a left BKA. Apparently had staph sepsis. In the meantime being off the right foot this is closed over. He follows up with Dr. 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 Subsequent to this he actually had multiple falls in fact he tells me to following which calls the wound to be his apparently. He has been seeing Dr. [name] and his physician assistant in the interim. They actually did want him to come back for reevaluation to see if  there's anything we can do for me wound care perspective the help this area to heal more appropriately. The patient states that he does not have a tremendous amount of pain which is good news. No fevers, chills, nausea, or vomiting noted at this time. We have gotten approval from Dr. [name] office had [place] for [place] to treat the patient for his stop wound. This is due to the fact that the patient was in the 90 day postop global. 1/21; the patient does not have an open wound on the right foot although he does have some pressure areas that will need to be padded when he is transferring. The dehisced surgical wound looks clean although there is some undermining of 1.5 cm  superiorly. 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 This appears healthy but there is undermining. TheraSkin #1 2/11; TheraSkin #2 2/25; TheraSkin #3. Wound is measuring smaller 3/10; TheraSkin #4 wound is measuring smaller 3/24; TheraSkin #5 wound is measuring smaller and looks healthy. 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 He has no open area on the right foot although he does have some callused area over the original third metatarsal head wound. The third metatarsal is also subluxed on the right foot. I have warned him today that he cannot consider wearing a prosthesis for at least a month but he can go for measurements. He is going to need to keep the right foot padded is much as possible in his diabetic shoe indefinitely READMISSION 06/12/2019 Mr. Ormand is a type II diabetic on dialysis. He has been in this clinic multiple times with wounds on his bilateral lower extremities. 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 He also has a history of wounds on his right foot but tells me that everything is going well here. He actually came in here with his prosthesis walking with a cane. We were all really quite gratified to see this He states over the last several weeks he has had a painful area at the tip of the left third finger. His dialysis shunt in his is in the left upper arm and this particularly hurts during dialysis when his fingers get numb and there is a lot of pain in the tip of his left third finger. I believe he is seen vascular surgery. He had a Doppler done to evaluate for a dialysis steal syndrome. He is going for a banding procedure 1 week from today towards the left AV fistula. I think if that is unsuccessful they will be looking at creating a new shunt. He had a course  of doxycycline when he which he finished about a week ago. He has been using Bactroban to the finger 6/25; the patient had his banding procedure and things seem to be going better. He is not having pain in his hand at dialysis. The area on the tip of his finger seems about closed. He did however have a paronychia I the last time I saw him. I have been advising him to use topical Bactroban washing the finger. Culture I did last time showed a few staph epidermidis which I was willing to dismiss as superficial skin contaminant. He arrives today with the finger wound looking better however the paronychia a seems worse 7/9; 2-week follow-up.  He does not have an open wound on the tip of his third left finger. I thought he had an initial ischemic wound on the tip of the finger as well as a paronychia a medially. All of this appears to be closed. 8/6-Patient returns after 3 weeks for follow-up and has a right second met head plantar callus with a significant area of maceration hiding an ulcer ABI repeated today on right - 1.26 8/13-Patient returns at 1 week, the right second met head plantar wound appears to be slightly better, it is surrounded by the callus area we are using silver alginate 8/20; the patient came back to clinic 2 weeks ago with a wound on the right second metatarsal head. He apparently told me he developed callus in this area but also went away from his custom shoes to a pair of running shoes. Using silver alginate. 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 There might be much we can do to offload this although the patient tells me he has a wheelchair that he is using to try and stay off the wound is much as possible 8/27; right second metatarsal head. Still small punched out area with thick callus and subcutaneous tissue around the wound. We have been using silver collagen. This is always been an issue with this man's wound especially on this foot 9/3; right  second met head. Still the same punched-out area with thick callus and subcutaneous tissue around the wound removing the circumference demonstrates repetitively undermining area. I have removed all the subcutaneous tissue associated with this. We have been using silver collagen 9/15; right second metatarsal head. Same punched-out thick callus and subcutaneous tissue around the wound area. Still requiring debridement we have been using silver collagen I changed back to silver alginate X-ray last time showed no evidence of osteomyelitis. Culture showed Klebsiella and he was started and Keflex apparently just 4 days ago 9/24; right second metatarsal head. He is completed the antibiotics I gave him for 10 days. Same punched-out thick callus and subcutaneous tissue around the area. Still requiring debridement. We have been using silver alginate. The area itself looks swollen and this could be just [place] that comes on this area with walking on a prosthesis on the left however I think he needs an MRI and I am going to order that today. There is not an option to offload this further 10/1; right second plantar metatarsal head. MRI booked for October 6. Generally looking better today using silver alginate 10/8; right second plantar metatarsal head. MRI that was done on 10/6 was negative for osteomyelitis noted prior amputations of the second and fourth toes at the [facility]. Prior amputation of the third toe to the base of the middle phalanx. Prior amputation of the fifth toe to the head of the metatarsal. 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 He does not recall about hearing about this. He did not have any pain but then again he has reduced sensation from diabetic neuropathy 10/15; right second plantar metatarsal head. Still in the same condition callus nonviable subcutaneous tissue which cleans up nicely with debridement but reforms by the next week. The  patient tells me that he is offloading this is much as he can including taking his wheelchair to dialysis. We have been using silver alginate, changed to silver collagen today 10/22; right second plantar metatarsal head. Wound measures smaller but still thick callus around this wound.  Still requiring debridement 11/5 right second plantar metatarsal head. Less callus around the wound circumference but still requiring debridement. There is still some undermining which I cleaned out the wound looks healthy but still considerable punched out depth with a relatively small circumference to the wound there is no palpable bone no purulent drainage 11/12; right second plantar metatarsal head. Small wound with thick callus tissue around the wound and significant undermining. We have been using silver collagen after my continuous debridements of this area 11/19; patient arrives in today with purulent drainage coming out of the wound in the second third met head area on the right foot. Serosanguineous thick drainage. Specimen obtained for culture. 11/21/2019 on evaluation today patient appears to be doing well with regard to his foot ulcer. The good news is is culture came back negative for any bacteria there was no growth. The other good news is his x-ray also appears to be doing well. The foot is also measuring much better than what it was. Overall I am very pleased with how things seem to be progressing. 12/22; patient was in [facility] for several weeks due to the death of a family member. He said he stayed off the wound using silver alginate. 01/03/2020. Patient has a small open area with roughly 0.3 cm of circumferential undermining. The orifice looks smaller. We have been using silver collagen. There is not a wound more aggressive way to offload this area as he has a prosthesis on the other leg 1/21; again the same small area is 2 weeks ago. We have been using silver collagen I change that to  endoform today I really believe that this is an offloading issue 2/4; the area on the right third met head. We have been using endoform. 2/11 the area on the right third met head we have been using silver alginate. 2/25; the area on the right third met head. There is much less callus on this today. The patient states he is offloading this even more aggressively. As an example he is taking his wheelchair into dialysis on most days 3/4; right third metatarsal head. Again he has eschar over the surface of the wound which I removed with a #3 curette. Some subcutaneous debris. This has 0.8 cm of direct probing depth. I cannot feel any bone here. I did do a culture the area 3/11; right third metatarsal head. Again an eschared area over the wound which almost makes this look superficial. Again debridement reveals a wound with probing depth probably not much different from last week there is no palpable bone no palpable purulence. The patient tells me that he is making every effort to offload this area properly. He is taking wheelchair into dialysis etc. There is no option for forefoot offloading or a total contact cast. We used Oasis #1 today 3/18; again callus and eschar over the wound surface. I remove this but still a probing wound although only 3 mm in depth this time. This is an improvement 3/25; again callus and eschar over the wound surface which I removed the wound is much deeper today at 0.7 cm. There is no palpable bone. Because of the appearance of this a culture was done. I am not going to put the Oasis back in this. Concerned about underlying infection. Notable for the fact that he is just finished doxycycline for the last go round of this Objective Constitutional Patient is hypotensive. However this is quite normal for him. Pulse regular and within target range for patient.Marland Kitchen  Respirations regular, non-labored and within target range.. Temperature is normal and within the target range for  the patient.Marland Kitchen Appears in no distress. Vitals Time Taken: 1:21 PM, Height: 73 in, Weight: 202 lbs, BMI: 26.6, Temperature: 98.3 F, Pulse: 74 bpm, Respiratory Rate: 18 breaths/min, Blood Pressure: 95/49 mmHg, Capillary Blood Glucose: 126 mg/dl. General Notes: Wound exam; callus and subcutaneous debris around the wound edge. I remove the callus. Unfortunately the depth on this is more than doubled. There is no purulence however I did a swab culture. Integumentary (Hair, Skin) Wound #41 status is Open. Original cause of wound was Gradually Appeared. The wound is located on the Right Metatarsal head second. The wound measures 0.5cm length x 0.4cm width x 1cm depth; 0.157cm^2 area and 0.157cm^3 volume. There is Fat Layer (Subcutaneous Tissue) Exposed exposed. There is no tunneling noted, however, there is undermining starting at 12:00 and ending at 12:00 with a maximum distance of 0.9cm. There is a small amount of serosanguineous drainage noted. The wound margin is well defined and not attached to the wound base. There is large (67-100%) red granulation within the wound bed. There is no necrotic tissue within the wound bed. 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 There was a Selective/Open Wound Non-Viable Tissue Debridement with a total area of 0.2 sq cm performed by Ricard Dillon., MD. With the following instrument(s): Curette to remove Viable and Non-Viable tissue/material. Material removed includes Callus after achieving pain control using Lidocaine 4% Topical  Solution. 1 specimen was taken by a Swab and sent to the lab per facility protocol. A time out was conducted at 13:50, prior to the start of the procedure. A Moderate amount of bleeding was controlled with Pressure. The procedure was tolerated well with a pain level of 0 throughout and a pain level of 0 following the procedure. Post Debridement Measurements: 0.5cm length x 0.4cm width x 1cm depth; 0.157cm^3 volume. Character of Wound/Ulcer Post Debridement is improved. Severity of Tissue Post Debridement is: Fat layer exposed. Post procedure Diagnosis Wound #41: Same as Pre-Procedure Plan Follow-up Appointments: Return Appointment in 1 week. - to see Margarita Grizzle. Dressing Change Frequency: Wound #41 Right Metatarsal head second: Change dressing every day. Wound Cleansing: May shower and wash wound with soap and water. Primary Wound Dressing: Wound #41 Right Metatarsal head second: Calcium Alginate with Silver - pack into wound bed. Secondary Dressing: Wound #41 Right Metatarsal head second: Foam - foam donut Kerlix/Rolled Gauze Dry Gauze Other: - felt to periwound and place one below wound. lightly wrap coban to hold dressing in place. felt / pad wound Edema Control: Avoid standing for long periods of time Elevate legs to the level of the heart or above for 30 minutes daily and/or when sitting, a frequency of: - throughout the day. Off-Loading: Other: - felt to periwound and place one below wound. Additional Orders / Instructions: Other: - minimize walking and standing on foot to aid in wound healing and callus buildup. Laboratory ordered were: Anerobic culture right foot - someone will call you with any abnormal results. 1. Silver alginate 2. Culture but no empiric antibiotics he just completed doxycycline.  3. This is very concerning for infection. He had less callus on this than I am used to seeing suggesting he is doing a good job offloading this. Electronic Signature(s) Signed:  03/20/2020 6:03:33 PM By: Linton Ham MD Entered By: Linton Ham on 03/20/2020 15:24:10 -------------------------------------------------------------------------------- SuperBill Details Patient Name: Date of Service: ZAIYDEN, STROZIER 03/20/2020 Medical Record [id number] Patient Account Number: [id number] Date of Birth/Sex: Treating RN: Sep 24, 1951 (69 y.o. 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, other extremity L03.115 Cellulitis of right lower limb Facility Procedures The patient participates with Medicare or their insurance follows the Medicare Facility Guidelines: CPT4 Code Description Modifier Quantity [id number] - DEBRIDE WOUND 1ST 20 SQ CM OR < 1 ICD-10 Diagnosis Description L97.512 Non-pressure chronic ulcer  of other part of right foot with fat layer exposed Physician Procedures CPT4 Code Description: [id number] - WC PHYS DEBR WO ANESTH 20 SQ CM ICD-10 Diagnosis Description L97.512 Non-pressure chronic ulcer of other part of right foot with fat Modifier: layer exposed Quantity: 1 Electronic Signature(s) Signed: 03/20/2020 6:03:33 PM By: Linton Ham MD Entered By: Linton Ham on 03/20/2020 15:24:22

## 2020-03-24 LAB — AEROBIC CULTURE W GRAM STAIN (SUPERFICIAL SPECIMEN)

## 2020-03-25 ENCOUNTER — Encounter (HOSPITAL_BASED_OUTPATIENT_CLINIC_OR_DEPARTMENT_OTHER): Payer: Medicare Other | Admitting: Internal Medicine

## 2020-03-25 ENCOUNTER — Other Ambulatory Visit: Payer: Self-pay

## 2020-03-25 DIAGNOSIS — E11621 Type 2 diabetes mellitus with foot ulcer: Secondary | ICD-10-CM | POA: Diagnosis not present

## 2020-03-31 NOTE — Progress Notes (Signed)
[name] ([id number]) Visit Report for 03/25/2020 Debridement Details Patient Name: Date of Service: [name], [name] 03/25/2020 9:45 AM Medical Record [id number] Patient Account Number: [id number] Date of Birth/Sex: [name] (68 y.o. M) Treating RN: [name] Primary Care Provider: Kristie [name] Other Clinician: Referring Provider: Treating Provider/Extender:[name], [name] in Treatment: 41 Debridement Performed for Wound #41 Right Metatarsal head second Assessment: Performed By: Physician [name]., MD Debridement Type: Debridement Severity of Tissue Pre Fat layer exposed Debridement: Level of Consciousness (Pre- Awake and Alert procedure): Pre-procedure Verification/Time Out Taken: Yes - 11:14 Start Time: 11:14 Pain Control: Lidocaine 5% topical ointment Total Area Debrided (L x W): 0.4 (cm) x 0.3 (cm) = 0.12 (cm) Tissue and other material Viable, Non-Viable, Callus, Slough, Subcutaneous, Skin: Dermis , Skin: Epidermis, debrided: Slough Level: Skin/Subcutaneous Tissue Debridement Description: Excisional Instrument: Curette Bleeding: Moderate Hemostasis Achieved: Pressure End Time: 11:16 Procedural Pain: 0 Post Procedural Pain: 0 Response to Treatment: Procedure was tolerated well Level of Consciousness Awake and Alert (Post-procedure): Post Debridement Measurements of Total Wound Length: (cm) 0.4 Width: (cm) 0.3 Depth: (cm) 0.7 Volume: (cm) 0.066 Character of Wound/Ulcer Post Improved Debridement: Severity of Tissue Post Debridement: Fat layer exposed Post Procedure Diagnosis Same as Pre-procedure Electronic Signature(s) Signed: 03/26/2020 5:40:01 PM By: [name] Coria RN Signed: 03/31/2020 7:44:34 AM By: [name] Ham MD Entered By: [name] on 03/25/2020 11:16:33 -------------------------------------------------------------------------------- HPI Details Patient Name: Date of Service: [name]  03/25/2020 9:45 AM Medical Record [id number] Patient Account Number: [id number] Date of Birth/Sex: Treating RN: [name] (68 y.o. [name] Primary Care Provider: Kristie [name] Other Clinician: Referring Provider: Treating Provider/Extender:[name], [name], [name] in Treatment: 41 History of Present Illness Location: Patient presents with a wound to bilateral feet. Quality: Patient reports experiencing essentially no pain. Severity: Mildly severe wound with no evidence of infection Duration: Patient has had the wound for greater than 2 weeks prior to presenting for treatment HPI Description: The patient is a pleasant 69 yrs old bm here for evaluation of ulcers on the plantar aspect of both feet. 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 He has a left arm graft for access. He has been trying to stay off his feet for weeks but does not seem to have any improvement in the wound. He has been seeing someone at the foot center and was referred to the Wound Care center for further evaluation. 12/30/15 the patient has 3 wounds one over the right first metatarsal head and 2 on the left foot at the left fifth and lleft first metatarsal head. All of these look relatively similar. The one over the left fifth probe to bone I could not prove that any of the others did. I note his MRI in [name] that did not show osteomyelitis. His peripheral pulses seem robust. All of these underwent surgical debridement to remove callus nonviable skin and subcutaneous tissue 01/06/16; the patient had his sutures removed from the right fifth ray amputation. There may be a small open part of this superiorly but otherwise the incision looks good. Areas over his right first, left first and left fifth metatarsal head all underwent surgical debridement as a varying degree of callus, skin and nonviable subcutaneous tissue. The area that is most  worrisome is the right fifth metatarsal head which has a wound probes precariously close to bone. There is no purulent drainage or erythema 01/13/16; I'm not exactly sure of the status  of the right fifth ray amputation site however he follows with Dr. [name] later this week. The area over his right first plantar metatarsal head, left first and fifth plantar metatarsal head are all in the same status. Thick circumferential callus, nonviable subcutaneous tissue. Culture of the left fifth did not culture last week 01/20/16; all of the patient's wounds appear and roughly the same state although his amputation site on the right lateral foot looks better. His wounds over the right first, left first and left fifth metatarsal heads all underwent difficult surgical debridement removing circumferential callus nonviable skin and subcutaneous tissue. There is no overt evidence of infection in these areas. MRI at the end of December of the left foot did not show osteomyelitis, right foot showed osteomyelitis of the right fifth digit he is now status post amputation. 01/27/16 the patient's wounds over his plantar first and fifth metatarsal heads on the left all appear better having been started on a total contact cast last week. They were debridement of circumferential callus and nonviable subcutaneous tissue as was the wound over the first metatarsal head. His surgical incision on the right also had some light surface debridement done. 03/23/2016 -- the patient was doing really well and most of his wounds had almost completely healed but now he came back today with a history of having a discharge from the area of his right foot on the plantar aspect and also between his first and second toe. He also has had some discharge from the left foot. Addendum: I spoke to the PA [name] at the dialysis center whose fax number is [phone]. We discussed the infection the patient has and she will put the  patient on vancomycin and Fortaz until the final culture report is back. We will fax this as soon as available. 03/30/2016 -- left foot x-ray IMPRESSION:No definitive osteomyelitis noted. X-ray of the right foot -- IMPRESSION: 1. Soft tissue swelling. Prior amputation right fifth digit. No acute or focal bony abnormality identified. If osteomyelitis remains a clinical concern, MRI can be obtained. 2. Peripheral vascular disease. His culture reports have grown an MSSA -- and we will fax this report to his hemodialysis center. He was called in a prescription of oral doxycycline but I have told her not to fill this in as he is already on IV antibiotics. 04/06/2016 -- a few days ago, I spoke to the hemodialysis center nurse who had stopped the IV antibiotics and he was given a prescription for doxycycline 100 mg by mouth twice a day for a week and he is on this at the present time. 05/11/2016 -- he has recently seen his PCP this week and his hemoglobin A1c was 7. He is working on his paperwork to get his orthotic shoes. 05/25/2016 -- -- x-ray of the left foot IMPRESSION:No acute bony abnormality. No radiographic changes of acute osteomyelitis. No change since prior study. X-ray of the right foot -- IMPRESSION: Postsurgical changes are seen involving the fifth toe. No evidence of acute osteomyelitis. he developed a large blister on the medial part of his right foot and this opened out and drain fluid. 06/01/2016 -- he has his MRI to be done this afternoon. 06/15/2016 -- MRI of the left forefoot without contrast shows 2 separate regions of cutaneous and subcutaneous edema and possible ulceration and blistering along the ball of the foot. No obvious osteomyelitis identified. MRI of the right foot showed cutaneous and subcutaneous thickening plantar to the first digit sesamoid with an  ulcer crater but no underlying osteomyelitis is identified. 06/22/2016 -- the right foot plantar ulcer has been  draining a lot of seropurulent material for the last few days. 06/30/2016 -- spoke to the dialysis center and I believe I spoke to [place] a PA at the center who discussed with me and agreed to putting Legrand Como on vancomycin until his cultures arrive. On review of his culture report no WBCs were seen or no organisms were seen and the culture was reincubated for better growth. The final report is back and there were no predominant growth including Streptococcus or Staphylococcus. Clinically though he has a lot of drainage from both wounds a lot of undermining and there is further blebs on the left foot towards the interspace between his first and second toe. 08/10/2016 -- the culture from the right foot showed normal skin flora and there was no Staphylococcus aureus was group A streptococcus isolated. 09/21/2016 -- -- MRI of the right foot was done on 09/13/2016 - IMPRESSION: Findings most consistent with acute osteomyelitis throughout the great toe, sesamoid bones and plantar aspect of the head of the first metatarsal. Fluid in the sheath of the flexor tendon of the great toe could be sympathetic but is worrisome for septic tenosynovitis. First MTP joint effusion worrisome for septic joint. He was admitted to the hospital on 09/11/2016 and treated for a fever with vancomycin and cefepime. He was seen by Dr. Bobby Rumpf of infectious disease who recommended 6 weeks treatment with vancomycin and ceftazidime with his hemodialysis and to continue to see as in the wound clinic. Vascular consult was pending. The patient was discharged home on 09/14/2016 and he would continue with IV antibiotics for 6 weeks. 09/28/16 wound appears reasonably healthy. Continuing with total contact cast 10/05/16 wound is smaller and looking healthy. Continue with total contact cast. He continues on IV antibiotics 10/12/2016 -- he has developed a new wound on the dorsal aspect of his left big toe and this is a  superficial injury with no surrounding cellulitis. He has completed 30 days of IV antibiotics and is now ready to start his hyperbaric oxygen therapy as per his insurance company's recommendation. 10/19/2016 -- after the cast was removed on the right side he has got good resolution of his ulceration on the right plantar foot. he has a new wound on the left plantar foot in the region of his fourth metatarsal and this will need sharp debridement. 10/26/2016 -- Xray of the right foot complete: IMPRESSION: Changes consistent with osteomyelitis involving the head of the first metatarsal and base of the first proximal phalanx. The sesamoid bones are also likely involved given their positioning. 11/02/2016 -- there still awaiting insurance clearance for his hyperbaric oxygen therapy and hopefully he will begin treatment soon. 11/09/2016 -- started with hyperbaric oxygen therapy and had some barotrauma to the right ear and this was seen by ENT who was prescribed Afrin drops and would probably continue with HBO and he is scheduled for myringotomy tubes on Friday 11/16/2016 -- he had his myringotomy tubes placed on Friday and has been doing much better after that with some fluid draining out after hyperbaric treatment today. The pain was much better. 11/30/2016 -- over the last 2 days he noticed a swelling and change of color of his right second toe and this had been draining minimal fluid. 12/07/2016 -- x-ray of the right foot -- IMPRESSION:1. Progressive ulceration at the distal aspect of the second digit with significant soft tissue swelling and osseous  changes in the distal phalanx compatible with osteomyelitis. 2. Chronic osteomyelitis at the first MTP joint. 3. Ulcerations at the second and third toes as well without definite osseous changes. Osteomyelitis is not excluded. 4. Fifth digit amputation. On 12/01/2016 I spoke to Dr. Bobby Rumpf, the infectious disease specialist, who kindly agreed  to treat this with IV antibiotics and he would call in the order to the dialysis center and this has been discussed in detail with the patient who will make the appropriate arrangements. The patient will also book an appointment as soon as possible to see Dr. [name] in the office. Of note the patient has not been on antibiotics this entire week as the dialysis center did not receive any orders from Dr. [name]. I got in touch with Dr. [name] who tells me the patient has an appointment to see him this coming Wednesday. 12/14/2016 -- the patient is on ceftazidime and vancomycin during his dialysis, and I understand this was put on by his nephrologist Dr. Raliegh Ip possibly after speaking with infectious disease Dr. [name] 12/23/16; patient was on my schedule today for a wound evaluation as he had difficulties with his schedule earlier this week. I note that he is on vancomycin and ceftazidine at dialysis. In spite of this he arrives today with a new wound on the base of the right second toe this easily probes to bone. The known wound at the tip of the second toe With this area. He also has a superficial area on the medial aspect of the third toe on the side of the DIP. This does not appear to have much depth. The area on the plantar left foot is a deep area but did not probe the bone 12/28/16 -- he has brought in some lab work and the most recent labs done showed a hemoglobin of 12.1 hematocrit of 36.3, neutrophils of 51% WBC count of 5.6, BN of 53, albumin of 4.1 globulin of 3.7, vancomycin 13 g per mL. 01/04/2017 -- he saw Dr. [name] who has recommended a amputation of the right second toe and he is awaiting this date. He sees Dr. Bobby Rumpf of infectious disease tomorrow. The bone culture taken on 12/28/2016 had no growth in 2 days. 01/11/2017 -- was seen by Dr. Bobby Rumpf regarding the management and has recommended a eval by vascular surgeons. He recommended to continue the antibiotics  during his hemodialysis. Xray of the left foot -- IMPRESSION: No acute fracture, dislocation, or osseous erosion identified. 01/18/2017 -- he has his vascular workup later today and he is going to have his right foot second toe amputation this coming Friday by Dr. [name]. We have also put in for a another 30 treatments with hyperbaric oxygen therapy 01/25/2017 -- I have reviewed Dr. Donnetta Hutching is vascular report from last week where he reviewed him and thought his vascular function was good enough to heal his amputation site and no further tests were recommended. He also had his orthopedic related to surgery which is still pending the notes and we will review these next week. 02/01/2017 -- the operative remote of Dr. 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 02/08/2017 --he was seen by Dr. [name]  on 02/03/2017 -- patient is on ceftaz and Vanco. after a thorough review he had recommended to continue with antibiotics during hemodialysis. The patient was seen by Dr. [name], but I do not find any follow-up note on the electronic medical record. he has removed the dressing over the right foot to remove the sutures and asked him to see him back in a month's time 02/22/2017 -- patient has been febrile and has been having symptoms of the upper respiratory tract infection but has not been checked for the flu and it's been over 4 days now. He says he is feeling better today. Some drainage between his left first and second toe and this needed to be looked at 03/08/2017 -- was seen by Dr. Bobby Rumpf on 03/07/2017 after review he will stop his antibiotics  and see him back in a month to see if he is feeling well. 03/15/2017 -- he has completed his course of hyperbaric oxygen therapy and is doing fine with his health otherwise. 03/22/2017 -- his nutritionist at the dialysis center has recommended a protein supplement to help build his collagen and we will prescribe this for him when he has the details 05/03/2017 -- he recently noticed the area on the plantar aspect of his fifth metatarsal head which opened out and had minimal drainage. 05/10/2017 -- -- x-ray of the left foot -- IMPRESSION:1. No convincing conventional radiographic evidence of active osteomyelitis.2. Active soft tissue ulceration at the tip of the fifth digit, and at the lateral aspect of the foot adjacent the base of the fifth metatarsal. 3. Surgical changes of prior fourth toe amputation. 4. 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 5. Small vessel atherosclerotic vascular calcifications. 6. 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 ======= Readmission after 5 weeks: 06/15/2017 -- the patient returns after 5 weeks having had an MRI done on 05/17/2017 MRI of the left foot without contrast showed soft tissue ulcer overlying the base of the fifth metatarsal. 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 No drainable fluid collection to suggest an abscess. He was in hospital between 05/16/2017 and 05/25/2017 -- and on discharge was asked to follow-up with Dr. [name] and Dr. [name]. He was treated 2 weeks post discharge with vancomycin plus ceftriaxone with hemodialysis and oral metronidazole 500 mg 3 times a day. 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 last hemoglobin A1c was 6.7% He had a postoperative MR of the foot on 05/23/2017 -- which showed no new areas of cortical bone  loss, edema or soft tissue ulceration to suggest osteomyelitis. the patient has completed his course of IV antibiotics during dialysis and is to see the infectious disease doctor tomorrow. Dr. [name] had seen him after suture removal and asked him to keep the wound with a dry dressing 06/21/2017 -- he was seen by Dr. Bobby Rumpf of infectious disease on 06/16/2017 -- he stopped his Flagyl and will see him back in 6 weeks. He was asked to follow-up with Dr. [name] and with the wound clinic clinic. 07/05/17; bone biopsy from last time showed acute osteomyelitis. This is from the reminiscent in the left fifth metatarsal. Culture result is apparently still pending [holding for anaerobe}. From my understanding in this case this is been a progressive necrotic wound which is deteriorated markedly over the last 3 weeks since he returned  here. He now has a large area of exposed bone which was biopsied and cultured last week. Dr. Graylon Good has put him on vancomycin and Fortaz during his hemodialysis and Flagyl orally. He is to see Dr. [name] next week 07/19/2017 -- the patient was reviewed by Dr. [name] of orthopedics who reviewed the case in detail and agreed with the plan to continue with IV antibiotics, aggressive wound care and hyperbaric oxygen therapy. He would see him back in 3 weeks' time 08/09/2017 -- saw Dr. Bobby Rumpf on 08/03/2017 -he was restarted on his antibiotics for 6 weeks which included vanco, ceftaz and Flagyl. he recommended continuing his antibiotics for 6 weeks and reevaluate his completion date. He would continue with wound care and hyperbaric oxygen therapy. 08/16/2017 -- I understand he will be completing his 6 weeks of antibiotics sometime later this week. 09/19/2017 -- he has been without his wound VAC for the last week due to lack of supplies. He has been packing his wound with silver alginate. 10/04/2017 -- he has an appointment with Dr. [name] tomorrow and hence we  will not apply the wound VAC after his dressing changes today. 10/11/2017 -- he was seen by Dr. Bobby Rumpf on 10/05/2017, noted that the patient was currently off antibiotics, and after thorough review he recommended he follow-up with the wound care as per plan and no further antibiotics at this point. 11/29/17; patient is arrived for the wound on his left lateral foot.Marland Kitchen He is completed IV antibiotics and 2 rounds of hyperbaric oxygen for treatment of underlying osteomyelitis. He arrives today with a surface on most of the wound area however our intake nurse noted drainage from the superior aspect. This was brought to my attention. 12/13/2017 -- the right plantar foot had a large bleb and once the bleb was opened out a large callus and subcutaneous debris was removed and he has a plantar ulcer near the fourth metatarsal head 12/28/17 on evaluation today patient appears to be doing acutely worse in regard to his left foot. The wound which has been appearing to do better as now open up more deeply there is bone palpable at the base of the wound unfortunately. He tells me that this feels "like it did when he had osteomyelitis previously" he also noted that his second toe on the left foot appears to be doing worse and is swollen there does appear to be some fluid collected underneath. His right foot plantar ulcer appears to be doing somewhat better at this point and there really is no complication at the site currently. No fevers, chills, nausea, or vomiting noted at this time. Patient states that he normally has no pain at this site however. Today he is having significant pain. 01/03/18; culture done last week showed methicillin sensitive staph aureus and group B strep. He was on Septra however he arrives today with a fever of 101. He has a functional dialysis shunt in his left arm but has had no pain here. No cough. He still makes some urine no dysuria he did have abdominal pain nausea and diarrhea  over the weekend but is not had any diarrhea since yesterday. Otherwise he has no specific complaints 01/05/18; the patient returns today in follow-up for his presentation from 1/8. At that point he was febrile. I gave him some Levaquin adjusted for his dialysis status. He tells me the fever broke that night and it is not likely that this was actually a wound infection. He is gone on to have  an MRI of the left foot; this showed interval development of an abnormal signal from the base of the third and fourth metatarsal and in the cuboid reminiscent consistent with osteomyelitis. There is no mention of the left second toe I think which was a concern when it was ordered. The patient is taking his Levaquin 01/10/18; the patient has completed his antibiotics today. The area on the left lateral foot is smaller but it still probes easily to bone. He has underlying osteomyelitis here and sees Dr. Megan Salon of infectious disease this week The area on the right third plantar metatarsal head still requires debridement not much change in dimensions He has a new wound on the medial tip of his right third toe again this probes to bone. He only noticed this 2 days ago 01/17/18; the patient saw Dr. [name] last week and he is back on Vanco and Fortaz at dialysis. 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 We are previously seeing him for a wound also on the base of the fourth med head on the right. X- ray of this area did not show osteomyelitis in relation to a new wound on the tip of his right third toe. Again this probes to bone. Finally his second toe on the left which didn't really show anything on the MRI at least no report appears to have a separated cutaneous area which I think is going to come right off the tip of his toe and leave exposed bone. Whether this is infectious or ischemic I am not  clear Culture I did from the third toe last week which was his new wound was negative. Plain x-ray on the right foot did not show osteomyelitis in the toes 01/24/18; the patient is going went to see Dr. [name]. He is going to have a amputation of the left second and right third toe although paradoxically the left second toe looks better than last week. We have treating a deep probing wound on the left lateral foot and the area over the third metatarsal head on the right foot. 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 Also a debridement including bone of the left lateral foot and a closure which is still sutured. This is a bit surprising. Also apparent debridement of the base of the right third toe wound. 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 I'm not sure exactly where we are in the frame of things here. The wound on the left lateral foot is still sutured also the amputation sites of the left second and right third toe. He is been using Xeroform to the sutured areas silver alginate to the right foot 02/14/18; he continues on Rocephin at dialysis for osteomyelitis. I still don't have a good sense of where we are in the treatment duration. The wounds on the left lateral foot had the sutures removed and this clearly still probes deeply. We debrided the area with a #5 curet. We'll use silver alginate to the wound the area over the right third met head also required debridement of callus skin and subcutaneous tissue and necrotic debris over the wound surface. This tunnels superiorly but  I did not unroofed this today. The areas on the tip of his toes have a crusted surface eschar I did not debridement this either. 02/21/18; he continues on Rocephin at dialysis for  osteomyelitis. I don't have a good sense of where we are and the treatment duration. The wounds on the left lateral foot is callused over on presentation but requires debridement. The area on the right third med head plantar aspect actually is measuring smaller 02/28/18;patient is on Vanco and Fortaz at dialysis, I'm not sure why I thought this was Rocephin above unless it is been changed. I don't have a good sense of time frame here. Using silver collagen to the area on the left lateral foot and right third med head plantar foot 03/07/18; patient is completing bank and Fortaz at dialysis soon. He is using Silver collagen to the area on the left lateral foot and the right third metatarsal head. He has a smaller superficial wound distal to the left lateral foot wound. 03/14/18-he is here in follow-up evaluation for multiple ulcerations to his bilateral feet. He presents with a new ulceration to the plantar aspect of the left foot underneath the blister, this was deroofed to reveal a partial thickness ulcer. He is voicing no complaints or concerns, tolerated dialysis yesterday. We will continue with same treatment plan and he will follow-up next week 03/21/18; the patient has a area on the lateral left foot which still has a small probing area. The overall surface area of the wound is better. He presented with a new ulceration on the left foot plantar fifth metatarsal head last week. The area on the right third metatarsal head appears smaller.using silver alginate to all wound areas. The patient is changing the dressing himself. He is using a Darco forefoot offloading are on the right and a healing sandal on the left 03/28/18; original wound left lateral foot. Still nowhere close to looking like a heeling surface secondary wound on the left lateral foot at the base of his question fifth metatarsal which was new last week Right third metatarsal head. We have been using silver alginate all wounds 04/04/18;  the original wound on the left lateral foot again heavy callus surrounding thick nonviable subcutaneous tissue all requiring debridement. The new wound from 2 weeks ago just near this at the base of the fifth metatarsal head still looks about the same Unfortunately there is been deterioration in the third medical head wound on the right which now probes to bone. I must say this was a superficial wound at one point in time and I really don't have a good frame of reference year. I'll have to go back to look through records about what we know about the right foot. 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 He is also been to see Dr. [name]. Somewhere in here as somebody is ordered a VASCULAR evaluation which was done on 03/22/18. On the right is anterior tibial artery was monophasic triphasic at the posterior tibial artery. On the left anterior tibial artery monophasic posterior tibial artery monophasic. ABI in the right was 1.43 on the left 1.21. TBIs on the right at 0.41 and on the left at 0.32. A vascular consult was recommended and I think has been arranged. 04/11/18; Mr. Ebel has not had an MRI of the right foot since 2017. Recent x-ray of the right foot done in January and February was negative. However  he has had a major deterioration in the wound over the third met head. He is completed antibiotics last week at dialysis [place]. I think he is going to see vascular in follow-up. The areas on the left lateral foot and left plantar fifth metatarsal head both look satisfactory. I debrided both of these areas although the tissue here looks good. The area on the left lateral foot once probe to bone it certainly does not do that now 04/18/18; MRI of the right foot is on Thursday. He has what looks to be serosanguineous purulent drainage coming out of the wound over the right third metatarsal head  today. He completed antibiotics bank and Fortaz 2 weeks ago at dialysis. He has a vascular follow-up with regards to his arterial insufficiency although I don't exactly see when that is booked. The areas on the left foot including the lateral left foot and the plantar left fifth metatarsal head look about the same. 04/25/18-He is here in follow-up evaluation for bilateral foot ulcers. MRI obtained was negative for osteomyelitis. Wound culture was negative. We will continue with same treatment plan he'll follow-up next week 05/02/18; the right plantar foot wound over the third metatarsal head actually looks better than when I last saw this. His MRI was negative for osteomyelitis wound culture was negative. On the left plantar foot both wounds on the plantar fifth metatarsal head and on the lateral foot both are covered in a very hard circumferential callus. 05/09/18; right plantar foot wound over the third metatarsal head stable from last week. Left plantar foot wound over the fifth metatarsal head also stable but with callus around both wound areas The area on the left lateral foot had thick callus over a surface and I had some thoughts about leaving this intact however it felt boggy. We've been using silver alginate all wounds 05/16/18; since the patient was last here he was hospitalized from 5/15 through 5/19. 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 This grew MRSA. He was placed on vancomycin.MRI of the left foot suggested "progressive" osteomyelitis and bone destruction of the cuboid and the base of the fifth metatarsal. Stable erosive changes of the base of the second metatarsal. I'm wondering if they're aware that he had surgery and debridement in the area of the underlying bone previously by Dr. [name]. In any case, He was also revascularized with an angioplasty and stenting of the  left tibial peroneal trunk and angioplasty of the left posterior tibial artery. He is on vancomycin at dialysis. He has a 2 week follow-up with infectious disease. He has vascular surgery follow-up. He was started on Plavix. 05/23/18; the patient's wound on the lateral left foot at the level of the fifth metatarsal head and the plantar wound on the plantar fifth metatarsal head both look better. The area on the right third metatarsal head still has depth and undermining. We've been using silver alginate. He is on vancomycin. He has been revascularized on the left 05/30/18; he continues on vancomycin at dialysis. Revascularized on the left. The area on the left lateral foot is just about closed. Unfortunately the area over the plantar fifth metatarsal head undermining superiorly as does the area over the right third metatarsal head. Both of these significantly deteriorated from last week. He has appointments next week with infectious disease and orthopedics I delayed putting on a cast on the left  until those appointments which therefore we made we'll bring him in on Friday the 14th with the idea of a cast on the left foot 06/06/18; he continues on vancomycin at dialysis. Revascularized on the left. He arrives today with a ballotable swelling just above the area on the left lateral foot where his previous wound was. He has no pain but he is reasonably insensate. The difficult areas on the plantar left fifth metatarsal head looks stable whereas the area on the right third plantar metatarsal head about the same as last week there is undermining here although I did not unroofed this today. He required a considerable debridement of the swelling on the left lateral foot area and I unroofed an abscess with a copious amount of brown purulent material which I obtained for culture. I don't believe during his recent hospitalization he had any further imaging although I need to review this. Previous cultures  from this area done in this clinic showed MRSA, I would be surprised if this is not what this is currently even though he is on vancomycin. 06/13/18; he continues on vancomycin at dialysis however he finishes this on Sunday and then graduates the doxycycline previously prescribed by Dr. [name]. The abscess site on the lateral foot that I unroofed last time grew moderate amounts of methicillin-resistant staph aureus that is both vancomycin and tetracycline sensitive so we should be okay from that regard. He arrives today in clinic with a connection between the abscess site and the area on the lateral foot. We've been using silver alginate to all his wound areas. The right third metatarsal head wound is measuring smaller. Using silver alginate on both wound areas 06/20/18; transitioning to doxycycline prescribed by Dr. [name]. The abscess site on the lateral foot that I unroofed 2 weeks ago grew MRSA. That area has largely closed down although the lateral part of his wound on the fifth metatarsal head still probes to bone. I suspect all of this was connected. On the right third metatarsal head smaller looking wound but surrounded by nonviable tissue that once again requires debridement using silver alginate on both areas 06/27/18; on doxycycline 100 twice a day. The abscess on the lateral foot has closed down. Although he still has the wound on the left fifth metatarsal head that extends towards the lateral part of the foot. The most lateral part of this wound probes to bone Right third metatarsal head still a deep probing wound with undermining. Nevertheless I elected not to debridement this this week If everything is copious static next week on the left I'm going to attempt a total contact cast 07/04/18; he is tolerating his doxycycline. The abscess on the left lateral foot is closed down although there is still a deep wound here that probes to bone. We will use silver collagen under the total  contact cast Silver collagen to the deep wound over the right third metatarsal head is well 07/11/18; he is tolerating doxycycline. The left lateral fifth plantar metatarsal head still probes deeply but I could not probe any bone. We've been using silver collagen and this will be the second week under the total contact cast Silver collagen to deep wound over the right third metatarsal head as well 07/18/18; he is still taking doxycycline. The left lateral fifth plantar metatarsal this week probes deeply with a large amount of exposed bone. Quite a deterioration. He required extensive debridement. Specimens of bone for pathology and CNS obtained Also considerable debridement on the right third metatarsal  head 07/25/18; bone for pathology last week from the lateral left foot showed osteomyelitis. CandS of the bone Enterobacter. And Klebsiella. He is now on ciprofloxacin and the doxycycline is stopped [Dr. [name] of infectious disease] area He has an appointment with Dr. [name] tomorrow I'm going to leave the total contact cast off for this week. May wish to try to reapply that next week or the week after depending on the wound bed looks. I'm not sure if there is an operative option here, previously is followed with Dr. [name] 08/01/18 on evaluation today patient presents for reevaluation. He has been seen by infectious disease, Dr. [name], and he has placed the patient back on Ceftaz currently to be given to him at dialysis three times a week. 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 Fortunately there does not appear to be the evidence of infection at this point in time. Overall the patient has been tolerating the dressing changes without complication. We are no longer due lives in the cast of left foot secondary to the infection obviously. 08/10/18 on evaluation today patient appears to be doing rather well all things considered  in regard to his left plantar foot ulcer. He does have a significant callous on the lateral portion of his left foot he wonders if I can help clean that away to some degree today. Fortunately he is having no evidence of infection at this time which is good news. No fevers chills noted. With that being said his right plantar foot actually does have a significant callous buildup around the wound opening this seems to not be doing as well as I would like at this point. I do believe that he would benefit from sharp debridement at this site. Subsequently I think a total contact cast would be helpful for the right foot as well. 08/17/18; the patient arrived today with a total contact cast on the right foot. This actually looks quite good. He had gone to see Dr. Megan Salon of infectious disease about the osteomyelitis on the left foot. I think he is on IV Fortaz at dialysis although I am not exactly sure of the rationale for the Fortaz at the time of this dictation. He also arrived in today with a swelling on the lateral left foot this is the site of his original wounds in fact when I first saw this man when he was under Dr. Ardeen Garland care I think this was the site of where the wound was located. It was not particularly tender however using a small scalpel I opened it to [place] some moderate amount of purulent drainage. 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 Last MRI was in May that showed progressive osteomyelitis and bone destruction of the cuboid and base of the fifth metatarsal. Last week he had an abscess over this same area this was removed. Culture was negative. We are continuing with a total contact cast to the area on the third metatarsal head on the right and making some good progress here. The patient asked me again about renal transplant. He is not on the list because  of the open wounds. 08/31/18; apparently the patient had an interruption in the [place] but he is now back on this at dialysis. Apparently there was a misunderstanding and Dr. Crissie Figures orders. Due to have  the MRI of the left foot tonight. The area on the right foot continues to have callus thick skin and subcutaneous tissue around the wound edge that requires constant debridement however the wound is smaller we are using a total contact cast in this area. Alginate all wounds 09/07/18; without much surprise the MRI of his left foot showed osteomyelitis in the reminiscent of his fourth metatarsal but also the third metatarsal. She arrives in clinic today with the right foot and a total contact cast. There was purulent drainage coming out of the wound which I have cultured. Marked deterioration here with undermining widely around the wound orifice. Using pickups and a scalpel I remove callus and subcutaneous tissue from a substantial new opening. In a similar fashion the area on the left lateral foot that was blistered last week I have opened this and remove skin and subcutaneous tissue from this area to expose a obvious new wound. I think there is extension and communication between all of this on the left foot. The patient is on Fortaz at dialysis. Culture done of the right foot. We are clearly not making progress here. We have made him an appointment with Dr. [name] of orthopedics. I think an amputation of the left leg may be discussed. I don't think there is anything that can be done with foot salvage. 09/14/18; culture from the right foot last time showed a very resistant MRSA. This is resistant to doxycycline. I'm going to try to get linezolid at least for a week. He does not have a appointment with Dr. [name] yet. 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 I'll send a message to Dr. [name] of infectious disease. I want the patient to see  Dr. [name] to go over the pros and cons of an amputation which I think will be a BKA. The patient had a question about whether this is curative or not. I told him that I thought it would be although spread of staph aureus infection is not unheard of. MRI of the right foot was done at the end of April 2019 did not show osteomyelitis. The patient last saw Dr. [name] on 9/3. I'll send Dr. [name] a message. I would really like him to weigh the pros and cons of a BKA on the left otherwise he'll probably need IV antibiotics and perhaps hyperbaric oxygen again. He has not had a good response to this in the past. His MRI earlier this month showed progressive damage in the remnants of the fourth and third metatarsal 09/21/2018; sees Dr. [name] of orthopedics next week to discuss a left BKA in response to the osteomyelitis in the left foot. Using silver alginate to all wound areas. He is completing the Zyvox I did put in for him after last culture showed MRSA 09/29/2018; patient saw Dr. [name] of orthopedics discussed the osteomyelitis in the left foot third and fourth metatarsals. He did not recommend urgent surgery but certainly stated the only surgical option would be a BKA. He is communicating with Dr. [name]. The problem here is the instability of the areas on his foot which constantly generate draining abscesses. I will communicate with Dr. [name] about this. He has an appointment on 10/23 10/05/2018; patient sees Dr. [name] on 10/23. I have sent him a secure message not ordered any additional antibiotics for now. Patient continues to have 2 open areas on the left plantar foot at the fifth metatarsal head and  the lateral aspect of the foot. These have not changed all that much. The area on the right third met head still has thick callus and surrounding subcutaneous tissue we have been using silver alginate 10/12/2018; patient sees Dr. [name] or colleague on 10/23. 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 The area on the right plantar foot is just about closed. The superior wound on the left fifth metatarsal head is callused over but I am not sure if this is closed. The area below it is about the same. Considerable amount of callus on the lateral foot. We have been using silver alginate to the wounds 10/19/2018; the patient saw Dr. [name] on 10/22. He wishes to try and continue to save the left foot. He has been given 6 weeks of oral ciprofloxacin. We continue to put a total contact cast on the right foot. The area over the fifth metatarsal head on the left is callused over/perhaps healed but I did not remove the callus to find out. He still has the wound on the left lateral midfoot requiring debridement. We have been using silver alginate to all wound areas 10/26/2018 On the left foot our intake nurse noted some purulent drainage from the inferior wound [currently the only one that is not callused over] On the right foot even though he is in total contact cast a considerable amount of thick black callus and surface eschar. On this side we have been using silver alginate under a total contact cast. he remains on ciprofloxacin as prescribed by Dr. [name] 11/02/2018; culture from last week which is done of the probing area on the left midfoot wound showed MRSA "few". I am going to need to contact Dr. [name] which I will do today I think he is going to need IV antibiotics again. The area on the left foot which was callused on the side is clearly separating today all of this was removed a copious amounts of callus and necrotic subcutaneous tissue. The area on the right plantar foot actually remained healthy looking with a healthy granulated base. We are using silver alginate to all wound areas 11/09/2018; unfortunately neither 1 of the patient's wounds areas looks at all satisfactory. On the left he has considerable  necrotic debris from the plantar wound laterally over the foot. I removed copious amounts of material including callus skin and subcutaneous tissue. On the right foot he arrives with undermining and frankly purulent drainage. Specimen obtained for culture and debridement of the callused skin and subcutaneous tissue from around the circumference. Because of this I cannot put him back in a total contact cast but to be truthful we are really unfortunately not making a lot of progress I did put in a secure text message to Dr. [name] wondering about the MRSA on the left foot. He suggested vancomycin although this is not started. I was left wondering if he expected me to send this into dialysis. Has we have more purulence on the right side wait for that result before calling dialysis 11/16/2018; I called dialysis earlier this week to get the vancomycin started. I think he got the first dose on Tuesday. The patient tells me that he fell earlier this week twisting his left foot and ankle and he is swelling. He continues to not look well. He had blood cultures done at dialysis on Tuesday he is not been informed of the results. 12/07/2018; the patient was admitted to hospital  from 11/22/2018 through 12/02/2018. He underwent a left BKA. Apparently had staph sepsis. In the meantime being off the right foot this is closed over. He follows up with Dr. 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 Subsequent to this he actually had multiple falls in fact he tells me to following which calls the wound to be his apparently. He has been seeing Dr. [name] and his physician assistant in the interim. They actually did want him to come back for reevaluation to see if there's anything we can do for me wound care perspective the help this area to heal more appropriately. The patient states that he does not have  a tremendous amount of pain which is good news. No fevers, chills, nausea, or vomiting noted at this time. We have gotten approval from Dr. [name] office had [place] for [place] to treat the patient for his stop wound. This is due to the fact that the patient was in the 90 day postop global. 1/21; the patient does not have an open wound on the right foot although he does have some pressure areas that will need to be padded when he is transferring. The dehisced surgical wound looks clean although there is some undermining of 1.5 cm superiorly. 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 This appears healthy but there is undermining. TheraSkin #1 2/11; TheraSkin #2 2/25; TheraSkin #3. Wound is measuring smaller 3/10; TheraSkin #4 wound is measuring smaller 3/24; TheraSkin #5 wound is measuring smaller and looks healthy. 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 He has no open area on the right foot although he does have some callused area over the original third metatarsal head wound. The third metatarsal is also subluxed on the right foot. I have warned him today that he cannot consider wearing a prosthesis for at least a month but he can go for measurements. He is going to need to keep the right foot padded is much as possible in his diabetic shoe indefinitely READMISSION 06/12/2019 Mr. Henningsen is a type II diabetic on dialysis. He has been in this clinic multiple times with wounds on his bilateral lower extremities. 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 He also has a history of wounds on his right foot but tells me that everything is going well here. He actually came in here with his prosthesis walking with a cane. We were all really quite gratified to see this He states over the  last several weeks he has had a painful area at the tip of the left third finger. His dialysis shunt in his is in the left upper arm and this particularly hurts during dialysis when his fingers get numb and there is a lot of pain in the tip of his left third finger. I believe he is seen vascular surgery. He had a Doppler done to evaluate for a dialysis steal syndrome. He is going for a banding procedure 1 week from today towards the left AV fistula. I think if that is unsuccessful they will be looking at creating a new shunt. He had a course of doxycycline when he which he finished about a week ago. He has been  using Bactroban to the finger 6/25; the patient had his banding procedure and things seem to be going better. He is not having pain in his hand at dialysis. The area on the tip of his finger seems about closed. He did however have a paronychia I the last time I saw him. I have been advising him to use topical Bactroban washing the finger. Culture I did last time showed a few staph epidermidis which I was willing to dismiss as superficial skin contaminant. He arrives today with the finger wound looking better however the paronychia a seems worse 7/9; 2-week follow-up. He does not have an open wound on the tip of his third left finger. I thought he had an initial ischemic wound on the tip of the finger as well as a paronychia a medially. All of this appears to be closed. 8/6-Patient returns after 3 weeks for follow-up and has a right second met head plantar callus with a significant area of maceration hiding an ulcer ABI repeated today on right - 1.26 8/13-Patient returns at 1 week, the right second met head plantar wound appears to be slightly better, it is surrounded by the callus area we are using silver alginate 8/20; the patient came back to clinic 2 weeks ago with a wound on the right second metatarsal head. He apparently told me he developed callus in this area but also went away from  his custom shoes to a pair of running shoes. Using silver alginate. 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 There might be much we can do to offload this although the patient tells me he has a wheelchair that he is using to try and stay off the wound is much as possible 8/27; right second metatarsal head. Still small punched out area with thick callus and subcutaneous tissue around the wound. We have been using silver collagen. This is always been an issue with this man's wound especially on this foot 9/3; right second met head. Still the same punched-out area with thick callus and subcutaneous tissue around the wound removing the circumference demonstrates repetitively undermining area. I have removed all the subcutaneous tissue associated with this. We have been using silver collagen 9/15; right second metatarsal head. Same punched-out thick callus and subcutaneous tissue around the wound area. Still requiring debridement we have been using silver collagen I changed back to silver alginate X-ray last time showed no evidence of osteomyelitis. Culture showed Klebsiella and he was started and Keflex apparently just 4 days ago 9/24; right second metatarsal head. He is completed the antibiotics I gave him for 10 days. Same punched-out thick callus and subcutaneous tissue around the area. Still requiring debridement. We have been using silver alginate. The area itself looks swollen and this could be just [place] that comes on this area with walking on a prosthesis on the left however I think he needs an MRI and I am going to order that today. There is not an option to offload this further 10/1; right second plantar metatarsal head. MRI booked for October 6. Generally looking better today using silver alginate 10/8; right second plantar metatarsal head. MRI that was done on 10/6 was negative for osteomyelitis noted prior amputations of the second and fourth toes at the [facility].  Prior amputation of the third toe to the base of the middle phalanx. Prior amputation of the fifth toe to the head of the metatarsal. They also noted a partially non-united stress fracture at the  base of the third metatarsal. He does not recall about hearing about this. He did not have any pain but then again he has reduced sensation from diabetic neuropathy 10/15; right second plantar metatarsal head. Still in the same condition callus nonviable subcutaneous tissue which cleans up nicely with debridement but reforms by the next week. The patient tells me that he is offloading this is much as he can including taking his wheelchair to dialysis. We have been using silver alginate, changed to silver collagen today 10/22; right second plantar metatarsal head. Wound measures smaller but still thick callus around this wound. Still requiring debridement 11/5 right second plantar metatarsal head. Less callus around the wound circumference but still requiring debridement. There is still some undermining which I cleaned out the wound looks healthy but still considerable punched out depth with a relatively small circumference to the wound there is no palpable bone no purulent drainage 11/12; right second plantar metatarsal head. Small wound with thick callus tissue around the wound and significant undermining. We have been using silver collagen after my continuous debridements of this area 11/19; patient arrives in today with purulent drainage coming out of the wound in the second third met head area on the right foot. Serosanguineous thick drainage. Specimen obtained for culture. 11/21/2019 on evaluation today patient appears to be doing well with regard to his foot ulcer. The good news is is culture came back negative for any bacteria there was no growth. The other good news is his x-ray also appears to be doing well. The foot is also measuring much better than what it was. Overall I am very pleased with  how things seem to be progressing. 12/22; patient was in [facility] for several weeks due to the death of a family member. He said he stayed off the wound using silver alginate. 01/03/2020. Patient has a small open area with roughly 0.3 cm of circumferential undermining. The orifice looks smaller. We have been using silver collagen. There is not a wound more aggressive way to offload this area as he has a prosthesis on the other leg 1/21; again the same small area is 2 weeks ago. We have been using silver collagen I change that to endoform today I really believe that this is an offloading issue 2/4; the area on the right third met head. We have been using endoform. 2/11 the area on the right third met head we have been using silver alginate. 2/25; the area on the right third met head. There is much less callus on this today. The patient states he is offloading this even more aggressively. As an example he is taking his wheelchair into dialysis on most days 3/4; right third metatarsal head. Again he has eschar over the surface of the wound which I removed with a #3 curette. Some subcutaneous debris. This has 0.8 cm of direct probing depth. I cannot feel any bone here. I did do a culture the area 3/11; right third metatarsal head. Again an eschared area over the wound which almost makes this look superficial. Again debridement reveals a wound with probing depth probably not much different from last week there is no palpable bone no palpable purulence. The patient tells me that he is making every effort to offload this area properly. He is taking wheelchair into dialysis etc. There is no option for forefoot offloading or a total contact cast. We used Oasis #1 today 3/18; again callus and eschar over the wound surface. I remove this but still  a probing wound although only 3 mm in depth this time. This is an improvement 3/25; again callus and eschar over the wound surface which I removed the  wound is much deeper today at 0.7 cm. There is no palpable bone. Because of the appearance of this a culture was done. I am not going to put the Oasis back in this. Concerned about underlying infection. Notable for the fact that he is just finished doxycycline for the last go round of this 3/30; still 0.7 cm in depth which is disappointing. Culture I gave last time showed a few Staph aureus which is methicillin susceptible. This should have been taken care of by the recent course of doxycycline I gave him. We are using silver alginate Electronic Signature(s) Signed: 03/31/2020 7:44:34 AM By: [name] Ham MD Entered By: [name] on 03/25/2020 11:22:47 -------------------------------------------------------------------------------- Physical Exam Details Patient Name: Date of Service: RAPHAEL, FITZPATRICK 03/25/2020 9:45 AM Medical Record [id number] Patient Account Number: [id number] Date of Birth/Sex: Treating RN: [birth year] [name] (68 y.o. [name] Primary Care Provider: Kristie [name] Other Clinician: Referring Provider: Treating Provider/Extender:Laury Huizar, [name], [name] in Treatment: 41 Constitutional Sitting or standing Blood Pressure is within target range for patient.. Pulse regular and within target range for patient.Marland Kitchen Respirations regular, non-labored and within target range.. Temperature is normal and within the target range for the patient.Marland Kitchen Appears in no distress. Notes Wound exam; still a small punched out area. I removed skin and subcutaneous tissue around the edges with a #3 curette the tissue looks healthy however there is still a relatively large amount of depth this does not probe to bone however Electronic Signature(s) Signed: 03/31/2020 7:44:34 AM By: [name] Ham MD Entered By: [name] on 03/25/2020 11:24:03 -------------------------------------------------------------------------------- Physician Orders Details Patient Name: Date  of Service: DANNEL, RAFTER 03/25/2020 9:45 AM Medical Record [id number] Patient Account Number: [id number] Date of Birth/Sex: Treating RN: [name] (68 y.o. 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 - to see Margarita Grizzle. Dressing Change Frequency Wound #41 Right Metatarsal head second Change dressing every day. Wound Cleansing May shower and wash wound with soap and water. Primary Wound Dressing Wound #41 Right Metatarsal head second Calcium Alginate with Silver - apply thin layer of bactroban then pack calcium alginate into wound bed. Secondary Dressing Wound #41 Right Metatarsal head second Foam - foam donut Kerlix/Rolled Gauze Dry Gauze Other: - felt to periwound and place one below wound. lightly wrap coban to hold dressing in place. felt / pad wound Edema Control Avoid standing for long periods of time Elevate legs to the level of the heart or above for 30 minutes daily and/or when sitting, a frequency of: - throughout the day. Off-Loading Other: - felt to periwound and place one below wound. Additional Orders / Instructions Other: - minimize walking and standing on foot to aid in wound healing and callus buildup. Patient Medications Allergies: penicillin Notifications Medication Indication Start End mupirocin 03/25/2020 DOSE topical 2 % ointment - ointment topical to wound apply daily Electronic Signature(s) Signed: 03/25/2020 11:26:29 AM By: [name] Ham  MD Entered By: [name]  on 03/25/2020 11:26:27 -------------------------------------------------------------------------------- Problem List Details Patient Name: Date of Service: ROCKLIN, SODERQUIST 03/25/2020 9:45 AM Medical Record [id number] Patient Account Number: [id number] Date of Birth/Sex: Treating RN: [birth year] (68 y.o. 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, 06/12/2019 No Yes other extremity L03.115 Cellulitis of right lower limb 11/15/2019 No Yes Inactive Problems ICD-10 Code Description Active Date Inactive Date L03.114 Cellulitis of left upper limb 06/12/2019 06/12/2019 L98.498 Non-pressure chronic ulcer of skin of other sites with other 06/12/2019 06/12/2019 specified severity S61.209D Unspecified open wound of unspecified finger without damage 06/12/2019 06/12/2019 to nail, subsequent encounter Resolved Problems Electronic Signature(s) Signed: 03/31/2020 7:44:34 AM By: [name] Ham MD Entered By: [name] on 03/25/2020 11:20:38 -------------------------------------------------------------------------------- Progress Note Details Patient Name: Date of Service: DARRELL, HAUK 03/25/2020 9:45 AM Medical Record [id number] Patient Account Number: [id number] Date of Birth/Sex: Treating RN: [birth year][name] (68 y.o. [name] Primary Care Provider: Kristie [name] Other Clinician: Referring Provider: Treating Provider/Extender:Raelynne Ludwick, [name], [name] in Treatment: 41 Subjective History  of Present Illness (HPI) The following HPI elements were documented for the patient's wound: Location: Patient presents with a wound to bilateral feet. Quality: Patient reports experiencing essentially no pain. Severity: Mildly severe wound with no evidence of infection Duration: Patient has had the wound for greater than 2 weeks prior to presenting for treatment The patient is a pleasant 69 yrs old bm here for evaluation of ulcers on the plantar aspect of both feet. 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 He has a left arm graft for access. He has been trying to stay off his feet for weeks but does not seem to have any improvement in the wound. He has been seeing someone at the foot center and was referred to the Wound Care center for further evaluation. 12/30/15 the patient has 3 wounds one over the right first metatarsal head and 2 on the left foot at the left fifth and lleft first metatarsal head. All of these look relatively similar. The one over the left fifth probe to bone I could not prove that any of the others did. I note his MRI in [name] that did not show osteomyelitis. His peripheral pulses seem robust. All of these underwent surgical debridement to remove callus nonviable skin and subcutaneous tissue 01/06/16; the patient had his sutures removed from the right fifth ray amputation. There may be a small open part of this superiorly but otherwise the incision looks good. Areas over his right first, left first and left fifth metatarsal head all underwent surgical debridement as a varying degree of callus, skin and nonviable subcutaneous tissue. The area that is most worrisome is the right fifth metatarsal head which has a wound probes precariously close to bone. There is no purulent drainage or erythema 01/13/16; I'm not exactly sure of the status of the right fifth ray amputation site however he follows with Dr. [name] later this  week. The area over his right first plantar metatarsal head, left first and fifth plantar metatarsal head are all in the same status. Thick circumferential callus, nonviable subcutaneous tissue. Culture of the  left fifth did not culture last week 01/20/16; all of the patient's wounds appear and roughly the same state although his amputation site on the right lateral foot looks better. His wounds over the right first, left first and left fifth metatarsal heads all underwent difficult surgical debridement removing circumferential callus nonviable skin and subcutaneous tissue. There is no overt evidence of infection in these areas. MRI at the end of December of the left foot did not show osteomyelitis, right foot showed osteomyelitis of the right fifth digit he is now status post amputation. 01/27/16 the patient's wounds over his plantar first and fifth metatarsal heads on the left all appear better having been started on a total contact cast last week. They were debridement of circumferential callus and nonviable subcutaneous tissue as was the wound over the first metatarsal head. His surgical incision on the right also had some light surface debridement done. 03/23/2016 -- the patient was doing really well and most of his wounds had almost completely healed but now he came back today with a history of having a discharge from the area of his right foot on the plantar aspect and also between his first and second toe. He also has had some discharge from the left foot. Addendum: I spoke to the PA [name] at the dialysis center whose fax number is [phone]. We discussed the infection the patient has and she will put the patient on vancomycin and Fortaz until the final culture report is back. We will fax this as soon as available. 03/30/2016 -- left foot x-ray IMPRESSION:No definitive osteomyelitis noted. X-ray of the right foot -- IMPRESSION: 1. Soft tissue swelling. Prior amputation right  fifth digit. No acute or focal bony abnormality identified. If osteomyelitis remains a clinical concern, MRI can be obtained. 2. Peripheral vascular disease. His culture reports have grown an MSSA -- and we will fax this report to his hemodialysis center. He was called in a prescription of oral doxycycline but I have told her not to fill this in as he is already on IV antibiotics. 04/06/2016 -- a few days ago, I spoke to the hemodialysis center nurse who had stopped the IV antibiotics and he was given a prescription for doxycycline 100 mg by mouth twice a day for a week and he is on this at the present time. 05/11/2016 -- he has recently seen his PCP this week and his hemoglobin A1c was 7. He is working on his paperwork to get his orthotic shoes. 05/25/2016 -- -- x-ray of the left foot IMPRESSION:No acute bony abnormality. No radiographic changes of acute osteomyelitis. No change since prior study. X-ray of the right foot -- IMPRESSION: Postsurgical changes are seen involving the fifth toe. No evidence of acute osteomyelitis. he developed a large blister on the medial part of his right foot and this opened out and drain fluid. 06/01/2016 -- he has his MRI to be done this afternoon. 06/15/2016 -- MRI of the left forefoot without contrast shows 2 separate regions of cutaneous and subcutaneous edema and possible ulceration and blistering along the ball of the foot. No obvious osteomyelitis identified. MRI of the right foot showed cutaneous and subcutaneous thickening plantar to the first digit sesamoid with an ulcer crater but no underlying osteomyelitis is identified. 06/22/2016 -- the right foot plantar ulcer has been draining a lot of seropurulent material for the last few days. 06/30/2016 -- spoke to the dialysis center and I believe I spoke to [place] a PA at the center  who discussed with me and agreed to putting Legrand Como on vancomycin until his cultures arrive. On review of his culture report  no WBCs were seen or no organisms were seen and the culture was reincubated for better growth. The final report is back and there were no predominant growth including Streptococcus or Staphylococcus. Clinically though he has a lot of drainage from both wounds a lot of undermining and there is further blebs on the left foot towards the interspace between his first and second toe. 08/10/2016 -- the culture from the right foot showed normal skin flora and there was no Staphylococcus aureus was group A streptococcus isolated. 09/21/2016 -- -- MRI of the right foot was done on 09/13/2016 - IMPRESSION: Findings most consistent with acute osteomyelitis throughout the great toe, sesamoid bones and plantar aspect of the head of the first metatarsal. Fluid in the sheath of the flexor tendon of the great toe could be sympathetic but is worrisome for septic tenosynovitis. First MTP joint effusion worrisome for septic joint. He was admitted to the hospital on 09/11/2016 and treated for a fever with vancomycin and cefepime. He was seen by Dr. Bobby Rumpf of infectious disease who recommended 6 weeks treatment with vancomycin and ceftazidime with his hemodialysis and to continue to see as in the wound clinic. Vascular consult was pending. The patient was discharged home on 09/14/2016 and he would continue with IV antibiotics for 6 weeks. 09/28/16 wound appears reasonably healthy. Continuing with total contact cast 10/05/16 wound is smaller and looking healthy. Continue with total contact cast. He continues on IV antibiotics 10/12/2016 -- he has developed a new wound on the dorsal aspect of his left big toe and this is a superficial injury with no surrounding cellulitis. He has completed 30 days of IV antibiotics and is now ready to start his hyperbaric oxygen therapy as per his insurance company's recommendation. 10/19/2016 -- after the cast was removed on the right side he has got good resolution of his  ulceration on the right plantar foot. he has a new wound on the left plantar foot in the region of his fourth metatarsal and this will need sharp debridement. 10/26/2016 -- Xray of the right foot complete: IMPRESSION: Changes consistent with osteomyelitis involving the head of the first metatarsal and base of the first proximal phalanx. The sesamoid bones are also likely involved given their positioning. 11/02/2016 -- there still awaiting insurance clearance for his hyperbaric oxygen therapy and hopefully he will begin treatment soon. 11/09/2016 -- started with hyperbaric oxygen therapy and had some barotrauma to the right ear and this was seen by ENT who was prescribed Afrin drops and would probably continue with HBO and he is scheduled for myringotomy tubes on Friday 11/16/2016 -- he had his myringotomy tubes placed on Friday and has been doing much better after that with some fluid draining out after hyperbaric treatment today. The pain was much better. 11/30/2016 -- over the last 2 days he noticed a swelling and change of color of his right second toe and this had been draining minimal fluid. 12/07/2016 -- x-ray of the right foot -- IMPRESSION:1. Progressive ulceration at the distal aspect of the second digit with significant soft tissue swelling and osseous changes in the distal phalanx compatible with osteomyelitis. 2. Chronic osteomyelitis at the first MTP joint. 3. Ulcerations at the second and third toes as well without definite osseous changes. Osteomyelitis is not excluded. 4. Fifth digit amputation. On 12/01/2016 oo I spoke to Dr. Bobby Rumpf,  the infectious disease specialist, who kindly agreed to treat this with IV antibiotics and he would call in the order to the dialysis center and this has been discussed in detail with the patient who will make the appropriate arrangements. The patient will also book an appointment as soon as possible to see Dr. [name] in the office. Of  note the patient has not been on antibiotics this entire week as the dialysis center did not receive any orders from Dr. [name]. I got in touch with Dr. [name] who tells me the patient has an appointment to see him this coming Wednesday. 12/14/2016 -- the patient is on ceftazidime and vancomycin during his dialysis, and I understand this was put on by his nephrologist Dr. Raliegh Ip possibly after speaking with infectious disease Dr. [name] 12/23/16; patient was on my schedule today for a wound evaluation as he had difficulties with his schedule earlier this week. I note that he is on vancomycin and ceftazidine at dialysis. In spite of this he arrives today with a new wound on the base of the right second toe this easily probes to bone. The known wound at the tip of the second toe With this area. He also has a superficial area on the medial aspect of the third toe on the side of the DIP. This does not appear to have much depth. The area on the plantar left foot is a deep area but did not probe the bone 12/28/16 -- he has brought in some lab work and the most recent labs done showed a hemoglobin of 12.1 hematocrit of 36.3, neutrophils of 51% WBC count of 5.6, BN of 53, albumin of 4.1 globulin of 3.7, vancomycin 13 g per mL. 01/04/2017 -- he saw Dr. [name] who has recommended a amputation of the right second toe and he is awaiting this date. He sees Dr. Bobby Rumpf of infectious disease tomorrow. The bone culture taken on 12/28/2016 had no growth in 2 days. 01/11/2017 -- was seen by Dr. Bobby Rumpf regarding the management and has recommended a eval by vascular surgeons. He recommended to continue the antibiotics during his hemodialysis. Xray of the left foot -- IMPRESSION: No acute fracture, dislocation, or osseous erosion identified. 01/18/2017 -- he has his vascular workup later today and he is going to have his right foot second toe amputation this coming Friday by Dr. [name]. We have also  put in for a another 30 treatments with hyperbaric oxygen therapy 01/25/2017 -- I have reviewed Dr. Donnetta Hutching is vascular report from last week where he reviewed him and thought his vascular function was good enough to heal his amputation site and no further tests were recommended. He also had his orthopedic related to surgery which is still pending the notes and we will review these next week. 02/01/2017 -- the operative remote of Dr. 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 02/08/2017 --he was seen by Dr. [name] on 02/03/2017 -- patient is on [place]. after a thorough review he had recommended to continue with antibiotics during hemodialysis. The patient was seen by Dr. [name], but I do not find any follow-up note on the electronic medical record. he has removed the  dressing over the right foot to remove the sutures and asked him to see him back in a month's time 02/22/2017 -- patient has been febrile and has been having symptoms of the upper respiratory tract infection but has not been checked for the flu and it's been over 4 days now. He says he is feeling better today. Some drainage between his left first and second toe and this needed to be looked at 03/08/2017 -- was seen by Dr. Bobby Rumpf on 03/07/2017 after review he will stop his antibiotics and see him back in a month to see if he is feeling well. 03/15/2017 -- he has completed his course of hyperbaric oxygen therapy and is doing fine with his health otherwise. 03/22/2017 -- his nutritionist at the dialysis center has recommended a protein supplement to help build  his collagen and we will prescribe this for him when he has the details 05/03/2017 -- he recently noticed the area on the plantar aspect of his fifth metatarsal head which opened out and had minimal drainage. 05/10/2017 -- -- x-ray of the left foot -- IMPRESSION:1. No convincing conventional radiographic evidence of active osteomyelitis.2. Active soft tissue ulceration at the tip of the fifth digit, and at the lateral aspect of the foot adjacent the base of the fifth metatarsal. 3. Surgical changes of prior fourth toe amputation. 4. 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 5. Small vessel atherosclerotic vascular calcifications. 6. 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 ======= Readmission after 5 weeks: 06/15/2017 -- the patient returns after 5 weeks having had an MRI done on 05/17/2017 MRI of the left foot without contrast showed soft tissue ulcer overlying the base of the fifth metatarsal. 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 No drainable fluid collection to suggest an abscess. He was in hospital between 05/16/2017 and 05/25/2017 -- and on discharge was asked to follow-up with Dr. [name] and Dr. [name]. He was treated 2 weeks post discharge with vancomycin plus ceftriaxone with hemodialysis and oral metronidazole 500 mg 3 times a day. 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 last hemoglobin A1c was 6.7% He had a postoperative MR of the foot on 05/23/2017 -- which showed no new areas of cortical bone loss, edema or soft tissue ulceration to suggest osteomyelitis. the patient has completed his course of IV antibiotics during dialysis and is to see the infectious disease doctor tomorrow. Dr. [name] had seen him after suture removal and asked him to keep the wound with a dry  dressing 06/21/2017 -- he was seen by Dr. Bobby Rumpf of infectious disease on 06/16/2017 -- he stopped his Flagyl and will see him back in 6 weeks. He was asked to follow-up with Dr. [name] and with the wound clinic clinic. 07/05/17; bone biopsy from last time showed acute osteomyelitis. This is from the reminiscent in the left fifth metatarsal. Culture result is apparently still pending [holding for anaerobe}. From my understanding in this case this is been a progressive necrotic wound which is deteriorated markedly over the last 3 weeks since he returned here. He now has a large area of exposed bone which was biopsied and cultured last week. Dr. Graylon Good has put him on vancomycin and Fortaz during his hemodialysis and Flagyl orally. He is to see Dr. [name] next week 07/19/2017 -- the patient was reviewed  by Dr. [name] of orthopedics who reviewed the case in detail and agreed with the plan to continue with IV antibiotics, aggressive wound care and hyperbaric oxygen therapy. He would see him back in 3 weeks' time 08/09/2017 -- saw Dr. Bobby Rumpf on 08/03/2017 -he was restarted on his antibiotics for 6 weeks which included vanco, ceftaz and Flagyl. he recommended continuing his antibiotics for 6 weeks and reevaluate his completion date. He would continue with wound care and hyperbaric oxygen therapy. 08/16/2017 -- I understand he will be completing his 6 weeks of antibiotics sometime later this week. 09/19/2017 -- he has been without his wound VAC for the last week due to lack of supplies. He has been packing his wound with silver alginate. 10/04/2017 -- he has an appointment with Dr. [name] tomorrow and hence we will not apply the wound VAC after his dressing changes today. 10/11/2017 -- he was seen by Dr. Bobby Rumpf on 10/05/2017, noted that the patient was currently off antibiotics, and after thorough review he recommended he follow-up with the wound care as per plan and no  further antibiotics at this point. 11/29/17; patient is arrived for the wound on his left lateral foot.Marland Kitchen He is completed IV antibiotics and 2 rounds of hyperbaric oxygen for treatment of underlying osteomyelitis. He arrives today with a surface on most of the wound area however our intake nurse noted drainage from the superior aspect. This was brought to my attention. 12/13/2017 -- the right plantar foot had a large bleb and once the bleb was opened out a large callus and subcutaneous debris was removed and he has a plantar ulcer near the fourth metatarsal head 12/28/17 on evaluation today patient appears to be doing acutely worse in regard to his left foot. The wound which has been appearing to do better as now open up more deeply there is bone palpable at the base of the wound unfortunately. He tells me that this feels "like it did when he had osteomyelitis previously" he also noted that his second toe on the left foot appears to be doing worse and is swollen there does appear to be some fluid collected underneath. His right foot plantar ulcer appears to be doing somewhat better at this point and there really is no complication at the site currently. No fevers, chills, nausea, or vomiting noted at this time. Patient states that he normally has no pain at this site however. Today he is having significant pain. 01/03/18; culture done last week showed methicillin sensitive staph aureus and group B strep. He was on [place] however he arrives today with a fever of 101. He has a functional dialysis shunt in his left arm but has had no pain here. No cough. He still makes some urine no dysuria he did have abdominal pain nausea and diarrhea over the weekend but is not had any diarrhea since yesterday. Otherwise he has no specific complaints 01/05/18; the patient returns today in follow-up for his presentation from 1/8. At that point he was febrile. I gave him some Levaquin adjusted for his dialysis status. He  tells me the fever broke that night and it is not likely that this was actually a wound infection. He is gone on to have an MRI of the left foot; this showed interval development of an abnormal signal from the base of the third and fourth metatarsal and in the cuboid reminiscent consistent with osteomyelitis. There is no mention of the left second toe I think which was a concern  when it was ordered. The patient is taking his Levaquin 01/10/18; the patient has completed his antibiotics today. The area on the left lateral foot is smaller but it still probes easily to bone. He has underlying osteomyelitis here and sees Dr. Megan Salon of infectious disease this week ooThe area on the right third plantar metatarsal head still requires debridement not much change in dimensions [facility] has a new wound on the medial tip of his right third toe again this probes to bone. He only noticed this 2 days ago 01/17/18; the patient saw Dr. [name] last week and he is back on Vanco and Fortaz at dialysis. 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 We are previously seeing him for a wound also on the base of the fourth med head on the right. X- ray of this area did not show osteomyelitis in relation to a new wound on the tip of his right third toe. Again this probes to bone. Finally his second toe on the left which didn't really show anything on the MRI at least no report appears to have a separated cutaneous area which I think is going to come right off the tip of his toe and leave exposed bone. Whether this is infectious or ischemic I am not clear Culture I did from the third toe last week which was his new wound was negative. Plain x-ray on the right foot did not show osteomyelitis in the toes 01/24/18; the patient is going went to see Dr. [name]. He is going to have a amputation of the left second and  right third toe although paradoxically the left second toe looks better than last week. We have treating a deep probing wound on the left lateral foot and the area over the third metatarsal head on the right foot. 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 Also a debridement including bone of the left lateral foot and a closure which is still sutured. This is a bit surprising. Also apparent debridement of the base of the right third toe wound. 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 I'm not sure exactly where we are in the frame of things here. The wound on the left lateral foot is still sutured also the amputation sites of the left second and right third toe. He is been using Xeroform to the sutured areas silver alginate to the right foot 02/14/18; he continues on Rocephin at dialysis for osteomyelitis. I still don't have a good sense of where we are in the treatment duration. The wounds on the left lateral foot had the sutures removed and this clearly still probes deeply. We debrided the area with a #5 curet. We'll use silver alginate to the wound oothe area over the right third met head also required debridement of callus skin and subcutaneous tissue and necrotic debris over the wound surface. This tunnels superiorly but I did not unroofed this today. ooThe areas on the tip of his toes have a crusted surface eschar I did not debridement this either. 02/21/18; he continues on Rocephin at dialysis for osteomyelitis. I don't have a good sense of where we are and the  treatment duration. The wounds on the left lateral foot is callused over on presentation but requires debridement. The area on the right third med head plantar aspect actually is measuring  smaller 02/28/18;patient is on Vanco and Fortaz at dialysis, I'm not sure why I thought this was Rocephin above unless it is been changed. I don't have a good sense of time frame here. Using silver collagen to the area on the left lateral foot and right third med head plantar foot 03/07/18; patient is completing bank and Fortaz at dialysis soon. He is using Silver collagen to the area on the left lateral foot and the right third metatarsal head. He has a smaller superficial wound distal to the left lateral foot wound. 03/14/18-he is here in follow-up evaluation for multiple ulcerations to his bilateral feet. He presents with a new ulceration to the plantar aspect of the left foot underneath the blister, this was deroofed to reveal a partial thickness ulcer. He is voicing no complaints or concerns, tolerated dialysis yesterday. We will continue with same treatment plan and he will follow-up next week 03/21/18; the patient has a area on the lateral left foot which still has a small probing area. The overall surface area of the wound is better. He presented with a new ulceration on the left foot plantar fifth metatarsal head last week. The area on the right third metatarsal head appears smaller.using silver alginate to all wound areas. The patient is changing the dressing himself. He is using a Darco forefoot offloading are on the right and a healing sandal on the left 03/28/18; original wound left lateral foot. Still nowhere close to looking like a heeling surface oosecondary wound on the left lateral foot at the base of his question fifth metatarsal which was new last week ooRight third metatarsal head. ooWe have been using silver alginate all wounds 04/04/18; the original wound on the left lateral foot again heavy callus surrounding thick nonviable subcutaneous tissue all requiring debridement. The new wound from 2 weeks ago just near this at the base of the fifth metatarsal head still looks about  the same ooUnfortunately there is been deterioration in the third medical head wound on the right which now probes to bone. I must say this was a superficial wound at one point in time and I really don't have a good frame of reference year. I'll have to go back to look through records about what we know about the right foot. 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 He is also been to see Dr. [name]. Somewhere in here as somebody is ordered a VASCULAR evaluation which was done on 03/22/18. On the right is anterior tibial artery was monophasic triphasic at the posterior tibial artery. On the left anterior tibial artery monophasic posterior tibial artery monophasic. ABI in the right was 1.43 on the left 1.21. TBIs on the right at 0.41 and on the left at 0.32. A vascular consult was recommended and I think has been arranged. 04/11/18; Mr. Olano has not had an MRI of the right foot since 2017. Recent x-ray of the right foot done in January and February was negative. However he has had a major deterioration in the wound over the third met head. He is completed antibiotics last week at dialysis [place]. I think he is going to see vascular in follow-up. The areas on the left lateral foot and left plantar fifth  metatarsal head both look satisfactory. I debrided both of these areas although the tissue here looks good. The area on the left lateral foot once probe to bone it certainly does not do that now 04/18/18; MRI of the right foot is on Thursday. He has what looks to be serosanguineous purulent drainage coming out of the wound over the right third metatarsal head today. He completed antibiotics bank and Fortaz 2 weeks ago at dialysis. He has a vascular follow-up with regards to his arterial insufficiency although I don't exactly see when that is booked. The areas on the left foot including the  lateral left foot and the plantar left fifth metatarsal head look about the same. 04/25/18-He is here in follow-up evaluation for bilateral foot ulcers. MRI obtained was negative for osteomyelitis. Wound culture was negative. We will continue with same treatment plan he'll follow-up next week 05/02/18; the right plantar foot wound over the third metatarsal head actually looks better than when I last saw this. His MRI was negative for osteomyelitis wound culture was negative. ooOn the left plantar foot both wounds on the plantar fifth metatarsal head and on the lateral foot both are covered in a very hard circumferential callus. 05/09/18; right plantar foot wound over the third metatarsal head stable from last week. ooLeft plantar foot wound over the fifth metatarsal head also stable but with callus around both wound areas ooThe area on the left lateral foot had thick callus over a surface and I had some thoughts about leaving this intact however it felt boggy. ooWe've been using silver alginate all wounds 05/16/18; since the patient was last here he was hospitalized from 5/15 through 5/19. 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 This grew MRSA. He was placed on vancomycin.MRI of the left foot suggested "progressive" osteomyelitis and bone destruction of the cuboid and the base of the fifth metatarsal. Stable erosive changes of the base of the second metatarsal. I'm wondering if they're aware that he had surgery and debridement in the area of the underlying bone previously by Dr. [name]. In any case, He was also revascularized with an angioplasty and stenting of the left tibial peroneal trunk and angioplasty of the left posterior tibial artery. He is on vancomycin at dialysis. He has a 2 week follow-up with infectious disease. He has vascular surgery follow-up. He was started on  Plavix. 05/23/18; the patient's wound on the lateral left foot at the level of the fifth metatarsal head and the plantar wound on the plantar fifth metatarsal head both look better. The area on the right third metatarsal head still has depth and undermining. We've been using silver alginate. He is on vancomycin. He has been revascularized on the left 05/30/18; he continues on vancomycin at dialysis. Revascularized on the left. The area on the left lateral foot is just about closed. Unfortunately the area over the plantar fifth metatarsal head undermining superiorly as does the area over the right third metatarsal head. Both of these significantly deteriorated from last week. He has appointments next week with infectious disease and orthopedics I delayed putting on a cast on the left until those appointments which therefore we made we'll bring him in on Friday the 14th with the idea of a cast on the left foot 06/06/18; he continues on vancomycin at dialysis. Revascularized on the left. He arrives today with a ballotable swelling just above the  area on the left lateral foot where his previous wound was. He has no pain but he is reasonably insensate. The difficult areas on the plantar left fifth metatarsal head looks stable whereas the area on the right third plantar metatarsal head about the same as last week there is undermining here although I did not unroofed this today. He required a considerable debridement of the swelling on the left lateral foot area and I unroofed an abscess with a copious amount of brown purulent material which I obtained for culture. I don't believe during his recent hospitalization he had any further imaging although I need to review this. Previous cultures from this area done in this clinic showed MRSA, I would be surprised if this is not what this is currently even though he is on vancomycin. 06/13/18; he continues on vancomycin at dialysis however he finishes this on Sunday  and then graduates the doxycycline previously prescribed by Dr. [name]. The abscess site on the lateral foot that I unroofed last time grew moderate amounts of methicillin-resistant staph aureus that is both vancomycin and tetracycline sensitive so we should be okay from that regard. He arrives today in clinic with a connection between the abscess site and the area on the lateral foot. We've been using silver alginate to all his wound areas. The right third metatarsal head wound is measuring smaller. Using silver alginate on both wound areas 06/20/18; transitioning to doxycycline prescribed by Dr. [name]. The abscess site on the lateral foot that I unroofed 2 weeks ago grew MRSA. That area has largely closed down although the lateral part of his wound on the fifth metatarsal head still probes to bone. I suspect all of this was connected. ooOn the right third metatarsal head smaller looking wound but surrounded by nonviable tissue that once again requires debridement using silver alginate on both areas 06/27/18; on doxycycline 100 twice a day. The abscess on the lateral foot has closed down. Although he still has the wound on the left fifth metatarsal head that extends towards the lateral part of the foot. The most lateral part of this wound probes to bone ooRight third metatarsal head still a deep probing wound with undermining. Nevertheless I elected not to debridement this this week ooIf everything is copious static next week on the left I'm going to attempt a total contact cast 07/04/18; he is tolerating his doxycycline. The abscess on the left lateral foot is closed down although there is still a deep wound here that probes to bone. We will use silver collagen under the total contact cast Silver collagen to the deep wound over the right third metatarsal head is well 07/11/18; he is tolerating doxycycline. The left lateral fifth plantar metatarsal head still probes deeply but I could not probe  any bone. We've been using silver collagen and this will be the second week under the total contact cast ooSilver collagen to deep wound over the right third metatarsal head as well 07/18/18; he is still taking doxycycline. The left lateral fifth plantar metatarsal this week probes deeply with a large amount of exposed bone. Quite a deterioration. He required extensive debridement. Specimens of bone for pathology and CNS obtained ooAlso considerable debridement on the right third metatarsal head 07/25/18; bone for pathology last week from the lateral left foot showed osteomyelitis. CandS of the bone Enterobacter. And Klebsiella. He is now on ciprofloxacin and the doxycycline is stopped [Dr. [name] of infectious disease] area He has an appointment with Dr. [name] tomorrow I'm  going to leave the total contact cast off for this week. May wish to try to reapply that next week or the week after depending on the wound bed looks. I'm not sure if there is an operative option here, previously is followed with Dr. [name] 08/01/18 on evaluation today patient presents for reevaluation. He has been seen by infectious disease, Dr. [name], and he has placed the patient back on Ceftaz currently to be given to him at dialysis three times a week. 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 Fortunately there does not appear to be the evidence of infection at this point in time. Overall the patient has been tolerating the dressing changes without complication. We are no longer due lives in the cast of left foot secondary to the infection obviously. 08/10/18 on evaluation today patient appears to be doing rather well all things considered in regard to his left plantar foot ulcer. He does have a significant callous on the lateral portion of his left foot he wonders if I can help clean that away to some degree today. Fortunately he is having no  evidence of infection at this time which is good news. No fevers chills noted. With that being said his right plantar foot actually does have a significant callous buildup around the wound opening this seems to not be doing as well as I would like at this point. I do believe that he would benefit from sharp debridement at this site. Subsequently I think a total contact cast would be helpful for the right foot as well. 08/17/18; the patient arrived today with a total contact cast on the right foot. This actually looks quite good. He had gone to see Dr. Megan Salon of infectious disease about the osteomyelitis on the left foot. I think he is on IV Fortaz at dialysis although I am not exactly sure of the rationale for the Fortaz at the time of this dictation. He also arrived in today with a swelling on the lateral left foot this is the site of his original wounds in fact when I first saw this man when he was under Dr. Ardeen Garland care I think this was the site of where the wound was located. It was not particularly tender however using a small scalpel I opened it to [place] some moderate amount of purulent drainage. 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 Last MRI was in May that showed progressive osteomyelitis and bone destruction of the cuboid and base of the fifth metatarsal. Last week he had an abscess over this same area this was removed. Culture was negative. ooWe are continuing with a total contact cast to the area on the third metatarsal head on the right and making some good progress here. The patient asked me again about renal transplant. He is not on the list because of the open wounds. 08/31/18; apparently the patient had an interruption in the [place] but he is now back on this at dialysis. Apparently there was a misunderstanding and Dr. Crissie Figures orders. Due to have  the MRI of the left foot tonight. ooThe area on the right foot continues to have callus thick skin and subcutaneous tissue around the wound edge that requires constant debridement however the wound is smaller we are using a total contact cast in this area. Alginate  all wounds 09/07/18; without much surprise the MRI of his left foot showed osteomyelitis in the reminiscent of his fourth metatarsal but also the third metatarsal. She arrives in clinic today with the right foot and a total contact cast. There was purulent drainage coming out of the wound which I have cultured. Marked deterioration here with undermining widely around the wound orifice. Using pickups and a scalpel I remove callus and subcutaneous tissue from a substantial new opening. ooIn a similar fashion the area on the left lateral foot that was blistered last week I have opened this and remove skin and subcutaneous tissue from this area to expose a obvious new wound. I think there is extension and communication between all of this on the left foot. ooThe patient is on Fortaz at dialysis. Culture done of the right foot. We are clearly not making progress here. ooWe have made him an appointment with Dr. [name] of orthopedics. I think an amputation of the left leg may be discussed. I don't think there is anything that can be done with foot salvage. 09/14/18; culture from the right foot last time showed a very resistant MRSA. This is resistant to doxycycline. I'm going to try to get linezolid at least for a week. He does not have a appointment with Dr. [name] yet. 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 I'll send a message to Dr. [name] of infectious disease. I want the patient to see Dr. [name] to go over the pros and cons of an amputation which I think will be a BKA. The patient had a question about whether this is curative or not. I told him that I thought it would be  although spread of staph aureus infection is not unheard of. MRI of the right foot was done at the end of April 2019 did not show osteomyelitis. The patient last saw Dr. [name] on 9/3. I'll send Dr. [name] a message. I would really like him to weigh the pros and cons of a BKA on the left otherwise he'll probably need IV antibiotics and perhaps hyperbaric oxygen again. He has not had a good response to this in the past. His MRI earlier this month showed progressive damage in the remnants of the fourth and third metatarsal 09/21/2018; sees Dr. [name] of orthopedics next week to discuss a left BKA in response to the osteomyelitis in the left foot. Using silver alginate to all wound areas. He is completing the Zyvox I did put in for him after last culture showed MRSA 09/29/2018; patient saw Dr. [name] of orthopedics discussed the osteomyelitis in the left foot third and fourth metatarsals. He did not recommend urgent surgery but certainly stated the only surgical option would be a BKA. He is communicating with Dr. [name]. The problem here is the instability of the areas on his foot which constantly generate draining abscesses. I will communicate with Dr. [name] about this. He has an appointment on 10/23 10/05/2018; patient sees Dr. [name] on 10/23. I have sent him a secure message not ordered any additional antibiotics for now. Patient continues to have 2 open areas on the left plantar foot at the fifth metatarsal head and the lateral aspect of the foot. These have not changed all that much. The area on the right third met head still has thick callus and surrounding subcutaneous tissue we have been using silver alginate 10/12/2018; patient sees Dr. [name] or colleague on 10/23. 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 The area on the right plantar foot is just about closed. The superior wound on the left fifth metatarsal head is  callused over but I am not sure if this is closed. The area below it is about the same. Considerable amount of callus on the lateral foot. We have been using silver alginate to the wounds 10/19/2018; the patient saw Dr. [name] on 10/22. He wishes to try and continue to save the left foot. He has been given 6 weeks of oral ciprofloxacin. We continue to put a total contact cast on the right foot. The area over the fifth metatarsal head on the left is callused over/perhaps healed but I did not remove the callus to find out. He still has the wound on the left lateral midfoot requiring debridement. We have been using silver alginate to all wound areas 10/26/2018 ooOn the left foot our intake nurse noted some purulent drainage from the inferior wound [currently the only one that is not callused over] ooOn the right foot even though he is in total contact cast a considerable amount of thick black callus and surface eschar. On this side we have been using silver alginate under a total contact cast. oohe remains on ciprofloxacin as prescribed by Dr. [name] 11/02/2018; culture from last week which is done of the probing area on the left midfoot wound showed MRSA "few". I am going to need to contact Dr. [name] which I will do today I think he is going to need IV antibiotics again. The area on the left foot which was callused on the side is clearly separating today all of this was removed a copious amounts of callus and necrotic subcutaneous tissue. ooThe area on the right plantar foot actually remained healthy looking with a healthy granulated base. ooWe are using silver alginate to all wound areas 11/09/2018; unfortunately neither 1 of the patient's wounds areas looks at all satisfactory. On the left he has considerable necrotic debris from the plantar wound laterally over the foot. I removed copious amounts of material including callus skin and subcutaneous tissue. On the right foot he arrives  with undermining and frankly purulent drainage. Specimen obtained for culture and debridement of the callused skin and subcutaneous tissue from around the circumference. Because of this I cannot put him back in a total contact cast but to be truthful we are really unfortunately not making a lot of progress I did put in a secure text message to Dr. [name] wondering about the MRSA on the left foot. He suggested vancomycin although this is not started. I was left wondering if he expected me to send this into dialysis. Has we have more purulence on the right side wait for that result before calling dialysis 11/16/2018; I called dialysis earlier this week to get the vancomycin started. I think he got the first dose on Tuesday. The patient tells me that he fell earlier this week twisting his left foot and ankle and he is swelling. He continues to not look well. He had blood cultures done at dialysis on Tuesday he is not been informed of the results. 12/07/2018; the patient was admitted to hospital from 11/22/2018 through 12/02/2018. He underwent a left BKA. Apparently had staph sepsis. In the meantime being off the right foot this is closed over. He follows up with Dr. [name] this afternoon Readmission: 01/10/19 upon evaluation today patient appears for follow-up in our clinic status  post having had a left BKA on 11/20/18. Subsequent to this he actually had multiple falls in fact he tells me to following which calls the wound to be his apparently. He has been seeing Dr. [name] and his physician assistant in the interim. They actually did want him to come back for reevaluation to see if there's anything we can do for me wound care perspective the help this area to heal more appropriately. The patient states that he does not have a tremendous amount of pain which is good news. No fevers, chills, nausea, or vomiting noted at this time. We have gotten approval from Dr. [name] office had [facility] for [place] to treat the patient for his stop wound. This is due to the fact that the patient was in the 90 day postop global. 1/21; the patient does not have an open wound on the right foot although he does have some pressure areas that will need to be padded when he is transferring. The dehisced surgical wound looks clean although there is some undermining of 1.5 cm superiorly. 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 This appears healthy but there is undermining. TheraSkin #1 2/11; TheraSkin #2 2/25; TheraSkin #3. Wound is measuring smaller 3/10; TheraSkin #4 wound is measuring smaller 3/24; TheraSkin #5 wound is measuring smaller and looks healthy. 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 He has no open area on the right foot although he does have some callused area over the original third metatarsal head wound. The third metatarsal is also subluxed on the right foot. I have warned him today that he cannot consider wearing a prosthesis for at least a month but he can go for measurements. He is going to need to keep the right foot padded is much as possible in his diabetic shoe indefinitely READMISSION 06/12/2019 Mr. Vaeth is a type II diabetic on dialysis. He has been in this clinic multiple times with wounds on his bilateral lower extremities. 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 He also has a history of wounds on his right foot but tells me that everything is going well here. He actually came in here with his prosthesis walking with a cane. We were all really quite gratified to see this He states over the last several weeks he has had a painful area at the tip of the left third finger. His dialysis shunt in his is in the left upper arm and this particularly hurts during  dialysis when his fingers get numb and there is a lot of pain in the tip of his left third finger. I believe he is seen vascular surgery. He had a Doppler done to evaluate for a dialysis steal syndrome. He is going for a banding procedure 1 week from today towards the left AV fistula. I think if that is unsuccessful they will be looking at creating a new shunt. He had a course of doxycycline when he which he finished about a week ago. He has been using Bactroban to the finger 6/25; the patient had his banding procedure and things seem to be going better. He is not having pain in his hand at dialysis. The area on the tip of his finger seems about closed. He did however have a paronychia  I the last time I saw him. I have been advising him to use topical Bactroban washing the finger. Culture I did last time showed a few staph epidermidis which I was willing to dismiss as superficial skin contaminant. He arrives today with the finger wound looking better however the paronychia a seems worse 7/9; 2-week follow-up. He does not have an open wound on the tip of his third left finger. I thought he had an initial ischemic wound on the tip of the finger as well as a paronychia a medially. All of this appears to be closed. 8/6-Patient returns after 3 weeks for follow-up and has a right second met head plantar callus with a significant area of maceration hiding an ulcer ABI repeated today on right - 1.26 8/13-Patient returns at 1 week, the right second met head plantar wound appears to be slightly better, it is surrounded by the callus area we are using silver alginate 8/20; the patient came back to clinic 2 weeks ago with a wound on the right second metatarsal head. He apparently told me he developed callus in this area but also went away from his custom shoes to a pair of running shoes. Using silver alginate. 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 There might be much we can do to  offload this although the patient tells me he has a wheelchair that he is using to try and stay off the wound is much as possible 8/27; right second metatarsal head. Still small punched out area with thick callus and subcutaneous tissue around the wound. We have been using silver collagen. This is always been an issue with this man's wound especially on this foot 9/3; right second met head. Still the same punched-out area with thick callus and subcutaneous tissue around the wound removing the circumference demonstrates repetitively undermining area. I have removed all the subcutaneous tissue associated with this. We have been using silver collagen 9/15; right second metatarsal head. Same punched-out thick callus and subcutaneous tissue around the wound area. Still requiring debridement we have been using silver collagen I changed back to silver alginate X-ray last time showed no evidence of osteomyelitis. Culture showed Klebsiella and he was started and Keflex apparently just 4 days ago 9/24; right second metatarsal head. He is completed the antibiotics I gave him for 10 days. Same punched-out thick callus and subcutaneous tissue around the area. Still requiring debridement. We have been using silver alginate. The area itself looks swollen and this could be just [place] that comes on this area with walking on a prosthesis on the left however I think he needs an MRI and I am going to order that today. There is not an option to offload this further 10/1; right second plantar metatarsal head. MRI booked for October 6. Generally looking better today using silver alginate 10/8; right second plantar metatarsal head. MRI that was done on 10/6 was negative for osteomyelitis noted prior amputations of the second and fourth toes at the [facility]. Prior amputation of the third toe to the base of the middle phalanx. Prior amputation of the fifth toe to the head of the metatarsal. 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 He does not recall about hearing about this. He did not have any pain but then again he has reduced sensation from diabetic neuropathy 10/15; right second plantar metatarsal head. Still in the same condition callus nonviable subcutaneous tissue which cleans  up nicely with debridement but reforms by the next week. The patient tells me that he is offloading this is much as he can including taking his wheelchair to dialysis. We have been using silver alginate, changed to silver collagen today 10/22; right second plantar metatarsal head. Wound measures smaller but still thick callus around this wound. Still requiring debridement 11/5 right second plantar metatarsal head. Less callus around the wound circumference but still requiring debridement. There is still some undermining which I cleaned out the wound looks healthy but still considerable punched out depth with a relatively small circumference to the wound there is no palpable bone no purulent drainage 11/12; right second plantar metatarsal head. Small wound with thick callus tissue around the wound and significant undermining. We have been using silver collagen after my continuous debridements of this area 11/19; patient arrives in today with purulent drainage coming out of the wound in the second third met head area on the right foot. Serosanguineous thick drainage. Specimen obtained for culture. 11/21/2019 on evaluation today patient appears to be doing well with regard to his foot ulcer. The good news is is culture came back negative for any bacteria there was no growth. The other good news is his x-ray also appears to be doing well. The foot is also measuring much better than what it was. Overall I am very pleased with how things seem to be progressing. 12/22; patient was in [facility] for several weeks due to the death of a family member. He said he stayed off the wound  using silver alginate. 01/03/2020. Patient has a small open area with roughly 0.3 cm of circumferential undermining. The orifice looks smaller. We have been using silver collagen. There is not a wound more aggressive way to offload this area as he has a prosthesis on the other leg 1/21; again the same small area is 2 weeks ago. We have been using silver collagen I change that to endoform today I really believe that this is an offloading issue 2/4; the area on the right third met head. We have been using endoform. 2/11 the area on the right third met head we have been using silver alginate. 2/25; the area on the right third met head. There is much less callus on this today. The patient states he is offloading this even more aggressively. As an example he is taking his wheelchair into dialysis on most days 3/4; right third metatarsal head. Again he has eschar over the surface of the wound which I removed with a #3 curette. Some subcutaneous debris. This has 0.8 cm of direct probing depth. I cannot feel any bone here. I did do a culture the area 3/11; right third metatarsal head. Again an eschared area over the wound which almost makes this look superficial. Again debridement reveals a wound with probing depth probably not much different from last week there is no palpable bone no palpable purulence. The patient tells me that he is making every effort to offload this area properly. He is taking wheelchair into dialysis etc. There is no option for forefoot offloading or a total contact cast. We used [place]asis #1 today 3/18; again callus and eschar over the wound surface. I remove this but still a probing wound although only 3 mm in depth this time. This is an improvement 3/25; again callus and eschar over the wound surface which I removed the wound is much deeper today at 0.7 cm. There is no palpable bone. Because of the appearance of  this a culture was done. I am not going to put the Oasis back in this.  Concerned about underlying infection. Notable for the fact that he is just finished doxycycline for the last go round of this 3/30; still 0.7 cm in depth which is disappointing. Culture I gave last time showed a few Staph aureus which is methicillin susceptible. This should have been taken care of by the recent course of doxycycline I gave him. We are using silver alginate Objective Constitutional Sitting or standing Blood Pressure is within target range for patient.. Pulse regular and within target range for patient.Marland Kitchen Respirations regular, non-labored and within target range.. Temperature is normal and within the target range for the patient.Marland Kitchen Appears in no distress. Vitals Time Taken: 10:26 AM, Height: 73 in, Weight: 202 lbs, BMI: 26.6, Temperature: 98.2 F, Pulse: 73 bpm, Respiratory Rate: 18 breaths/min, Blood Pressure: 102/56 mmHg, Capillary Blood Glucose: 98 mg/dl. General Notes: Wound exam; still a small punched out area. I removed skin and subcutaneous tissue around the edges with a #3 curette the tissue looks healthy however there is still a relatively large amount of depth this does not probe to bone however Integumentary (Hair, Skin) Wound #41 status is Open. Original cause of wound was Gradually Appeared. The wound is located on the Right Metatarsal head second. The wound measures 0.4cm length x 0.3cm width x 0.7cm depth; 0.094cm^2 area and 0.066cm^3 volume. There is Fat Layer (Subcutaneous Tissue) Exposed exposed. There is no tunneling noted, however, there is undermining starting at 12:00 and ending at 3:00 with a maximum distance of 0.5cm. There is a small amount of serosanguineous drainage noted. The wound margin is well defined and not attached to the wound base. There is large (67-100%) red granulation within the wound bed. There is no necrotic tissue within the wound bed. General Notes: callus to periwound. 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 There was a Excisional Skin/Subcutaneous Tissue Debridement with a total area of 0.12 sq cm performed by [name]., MD. With the following instrument(s): Curette to remove Viable and Non-Viable tissue/material. Material removed includes Callus, Subcutaneous Tissue, Slough, Skin: Dermis, and Skin: Epidermis after achieving pain control using Lidocaine 5% topical ointment. No specimens were taken. A time out was conducted at 11:14, prior to the start of the procedure. A Moderate amount of bleeding was controlled with Pressure. The procedure was tolerated well with a pain level of 0 throughout and a pain level of 0 following the procedure. Post Debridement Measurements: 0.4cm length x 0.3cm width x 0.7cm depth; 0.066cm^3 volume. Character of Wound/Ulcer Post Debridement is improved. Severity of Tissue Post Debridement is: Fat layer exposed. Post procedure Diagnosis Wound #41: Same as Pre-Procedure Plan Follow-up Appointments: Return Appointment in 1 week. - to see Margarita Grizzle. Dressing Change Frequency: Wound #41 Right Metatarsal head second: Change dressing every day. Wound Cleansing: May shower and wash wound with soap and water. Primary Wound Dressing: Wound #41 Right Metatarsal head second: Calcium Alginate with Silver - apply thin layer of bactroban then pack calcium alginate into wound bed. Secondary Dressing: Wound #41 Right Metatarsal head second: Foam - foam  donut Kerlix/Rolled Gauze Dry Gauze Other: - felt to periwound and place one below wound. lightly wrap coban to hold  dressing in place. felt / pad wound Edema Control: Avoid standing for long periods of time Elevate legs to the level of the heart or above for 30 minutes daily and/or when sitting, a frequency of: - throughout the day. Off-Loading: Other: - felt to periwound and place one below wound. Additional Orders / Instructions: Other: - minimize walking and standing on foot to aid in wound healing and callus buildup. The following medication(s) was prescribed: mupirocin topical 2 % ointment ointment topical to wound apply daily starting 03/25/2020 1. I am going to try to treat the staph aureus with topical Bactroban. This is only a few colonies and he is just received a course of doxycycline 2. May need to consider advanced imaging Electronic Signature(s) Signed: 03/31/2020 7:44:34 AM By: [name] Ham MD Entered By: [name] on 03/25/2020 11:27:17 -------------------------------------------------------------------------------- SuperBill Details Patient Name: Date of Service: PARV, MANTHEY 03/25/2020 Medical Record [id number] Patient Account Number: [id number] Date of Birth/Sex: Treating RN: [name] (68 y.o. 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

## 2020-04-01 ENCOUNTER — Other Ambulatory Visit: Payer: Self-pay

## 2020-04-01 ENCOUNTER — Encounter (HOSPITAL_BASED_OUTPATIENT_CLINIC_OR_DEPARTMENT_OTHER): Payer: Medicare Other | Attending: Internal Medicine | Admitting: Internal Medicine

## 2020-04-01 DIAGNOSIS — N186 End stage renal disease: Secondary | ICD-10-CM | POA: Diagnosis not present

## 2020-04-01 DIAGNOSIS — L97512 Non-pressure chronic ulcer of other part of right foot with fat layer exposed: Secondary | ICD-10-CM | POA: Insufficient documentation

## 2020-04-01 DIAGNOSIS — Z89512 Acquired absence of left leg below knee: Secondary | ICD-10-CM | POA: Diagnosis not present

## 2020-04-01 DIAGNOSIS — E1140 Type 2 diabetes mellitus with diabetic neuropathy, unspecified: Secondary | ICD-10-CM | POA: Insufficient documentation

## 2020-04-01 DIAGNOSIS — E11621 Type 2 diabetes mellitus with foot ulcer: Secondary | ICD-10-CM | POA: Insufficient documentation

## 2020-04-01 DIAGNOSIS — L03115 Cellulitis of right lower limb: Secondary | ICD-10-CM | POA: Insufficient documentation

## 2020-04-01 DIAGNOSIS — I509 Heart failure, unspecified: Secondary | ICD-10-CM | POA: Diagnosis not present

## 2020-04-01 DIAGNOSIS — M1990 Unspecified osteoarthritis, unspecified site: Secondary | ICD-10-CM | POA: Insufficient documentation

## 2020-04-01 DIAGNOSIS — I132 Hypertensive heart and chronic kidney disease with heart failure and with stage 5 chronic kidney disease, or end stage renal disease: Secondary | ICD-10-CM | POA: Diagnosis not present

## 2020-04-01 DIAGNOSIS — Z7902 Long term (current) use of antithrombotics/antiplatelets: Secondary | ICD-10-CM | POA: Diagnosis not present

## 2020-04-01 DIAGNOSIS — L98499 Non-pressure chronic ulcer of skin of other sites with unspecified severity: Secondary | ICD-10-CM | POA: Insufficient documentation

## 2020-04-01 DIAGNOSIS — E1122 Type 2 diabetes mellitus with diabetic chronic kidney disease: Secondary | ICD-10-CM | POA: Insufficient documentation

## 2020-04-01 DIAGNOSIS — E1151 Type 2 diabetes mellitus with diabetic peripheral angiopathy without gangrene: Secondary | ICD-10-CM | POA: Diagnosis not present

## 2020-04-02 NOTE — Progress Notes (Signed)
[name], [name] ([id number]) Visit Report for 04/01/2020 Debridement Details Patient Name: Date of Service: [name], [name] 04/01/2020 2:15 PM Medical Record [id number] Patient Account Number: [id number] Date of Birth/Sex: Treating RN: 19-Sep-1951 (69 y.o. [name]) Carlene Coria Primary Care Provider: [name] Other Clinician: Referring Provider: Treating Provider/Extender:Thornton Dohrmann, Jeronimo Greaves, Buena Irish in Treatment: 42 Debridement Performed for Wound #41 Right Metatarsal head second Assessment: Performed By: Physician Ricard Dillon., MD Debridement Type: Debridement Severity of Tissue Pre Fat layer exposed Debridement: Level of Consciousness (Pre- Awake and Alert procedure): Pre-procedure Verification/Time Out Taken: Yes - 15:24 Start Time: 15:24 Pain Control: Lidocaine 5% topical ointment Total Area Debrided (L x W): 0.2 (cm) x 0.2 (cm) = 0.04 (cm) Tissue and other material Viable, Non-Viable, Slough, Subcutaneous, Skin: Dermis , Skin: Epidermis, Slough debrided: Level: Skin/Subcutaneous Tissue Debridement Description: Excisional Instrument: Curette Bleeding: Minimum Hemostasis Achieved: Pressure End Time: 15:26 Procedural Pain: 0 Post Procedural Pain: 0 Response to Treatment: Procedure was tolerated well Level of Consciousness Awake and Alert (Post-procedure): Post Debridement Measurements of Total Wound Length: (cm) 0.2 Width: (cm) 0.2 Depth: (cm) 0.6 Volume: (cm) 0.019 Character of Wound/Ulcer Post Improved Debridement: Severity of Tissue Post Debridement: Fat layer exposed Post Procedure Diagnosis Same as Pre-procedure Electronic Signature(s) Signed: 04/01/2020 6:13:52 PM By: Linton Ham MD Signed: 04/01/2020 6:43:44 PM By: Carlene Coria RN Entered By: Linton Ham on 04/01/2020 16:01:46 -------------------------------------------------------------------------------- HPI Details Patient Name: Date of Service: [name] 04/01/2020  2:15 PM Medical Record [id number] Patient Account Number: [id number] Date of Birth/Sex: Treating RN: [birth year] (69 y.o. [name] Primary Care Provider: [name] Other Clinician: Referring Provider: Treating Provider/Extender:Atarah Cadogan, Jeronimo Greaves, Buena Irish in Treatment: 42 History of Present Illness Location: Patient presents with a wound to bilateral feet. Quality: Patient reports experiencing essentially no pain. Severity: Mildly severe wound with no evidence of infection Duration: Patient has had the wound for greater than 2 weeks prior to presenting for treatment HPI Description: The patient is a pleasant 69 yrs old bm here for evaluation of ulcers on the plantar aspect of both feet. 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 He has a left arm graft for access. He has been trying to stay off his feet for weeks but does not seem to have any improvement in the wound. He has been seeing someone at the foot center and was referred to the Wound Care center for further evaluation. 12/30/15 the patient has 3 wounds one over the right first metatarsal head and 2 on the left foot at the left fifth and lleft first metatarsal head. All of these look relatively similar. The one over the left fifth probe to bone I could not prove that any of the others did. I note his MRI in November that did not show osteomyelitis. His peripheral pulses seem robust. All of these underwent surgical debridement to remove callus nonviable skin and subcutaneous tissue 01/06/16; the patient had his sutures removed from the right fifth ray amputation. There may be a small open part of this superiorly but otherwise the incision looks good. Areas over his right first, left first and left fifth metatarsal head all underwent surgical debridement as a varying degree of callus, skin and nonviable subcutaneous tissue. The area that is most worrisome is  the right fifth metatarsal head which has a wound probes precariously close to bone. There is no purulent drainage or erythema 01/13/16; I'm not exactly sure of the status of  the right fifth ray amputation site however he follows with Dr. [name] later this week. The area over his right first plantar metatarsal head, left first and fifth plantar metatarsal head are all in the same status. Thick circumferential callus, nonviable subcutaneous tissue. Culture of the left fifth did not culture last week 01/20/16; all of the patient's wounds appear and roughly the same state although his amputation site on the right lateral foot looks better. His wounds over the right first, left first and left fifth metatarsal heads all underwent difficult surgical debridement removing circumferential callus nonviable skin and subcutaneous tissue. There is no overt evidence of infection in these areas. MRI at the end of December of the left foot did not show osteomyelitis, right foot showed osteomyelitis of the right fifth digit he is now status post amputation. 01/27/16 the patient's wounds over his plantar first and fifth metatarsal heads on the left all appear better having been started on a total contact cast last week. They were debridement of circumferential callus and nonviable subcutaneous tissue as was the wound over the first metatarsal head. His surgical incision on the right also had some light surface debridement done. 03/23/2016 -- the patient was doing really well and most of his wounds had almost completely healed but now he came back today with a history of having a discharge from the area of his right foot on the plantar aspect and also between his first and second toe. He also has had some discharge from the left foot. Addendum: I spoke to the PA [name] at the dialysis center whose fax number is [phone]. We discussed the infection the patient has and she will put the patient on  vancomycin and Fortaz until the final culture report is back. We will fax this as soon as available. 03/30/2016 -- left foot x-ray IMPRESSION:No definitive osteomyelitis noted. X-ray of the right foot -- IMPRESSION: 1. Soft tissue swelling. Prior amputation right fifth digit. No acute or focal bony abnormality identified. If osteomyelitis remains a clinical concern, MRI can be obtained. 2. Peripheral vascular disease. His culture reports have grown an MSSA -- and we will fax this report to his hemodialysis center. He was called in a prescription of oral doxycycline but I have told her not to fill this in as he is already on IV antibiotics. 04/06/2016 -- a few days ago, I spoke to the hemodialysis center nurse who had stopped the IV antibiotics and he was given a prescription for doxycycline 100 mg by mouth twice a day for a week and he is on this at the present time. 05/11/2016 -- he has recently seen his PCP this week and his hemoglobin A1c was 7. He is working on his paperwork to get his orthotic shoes. 05/25/2016 -- -- x-ray of the left foot IMPRESSION:No acute bony abnormality. No radiographic changes of acute osteomyelitis. No change since prior study. X-ray of the right foot -- IMPRESSION: Postsurgical changes are seen involving the fifth toe. No evidence of acute osteomyelitis. he developed a large blister on the medial part of his right foot and this opened out and drain fluid. 06/01/2016 -- he has his MRI to be done this afternoon. 06/15/2016 -- MRI of the left forefoot without contrast shows 2 separate regions of cutaneous and subcutaneous edema and possible ulceration and blistering along the ball of the foot. No obvious osteomyelitis identified. MRI of the right foot showed cutaneous and subcutaneous thickening plantar to the first digit sesamoid with an ulcer  crater but no underlying osteomyelitis is identified. 06/22/2016 -- the right foot plantar ulcer has been draining a lot of  seropurulent material for the last few days. 06/30/2016 -- spoke to the dialysis center and I believe I spoke to [place] a PA at the center who discussed with me and agreed to putting Legrand Como on vancomycin until his cultures arrive. On review of his culture report no WBCs were seen or no organisms were seen and the culture was reincubated for better growth. The final report is back and there were no predominant growth including Streptococcus or Staphylococcus. Clinically though he has a lot of drainage from both wounds a lot of undermining and there is further blebs on the left foot towards the interspace between his first and second toe. 08/10/2016 -- the culture from the right foot showed normal skin flora and there was no Staphylococcus aureus was group A streptococcus isolated. 09/21/2016 -- -- MRI of the right foot was done on 09/13/2016 - IMPRESSION: Findings most consistent with acute osteomyelitis throughout the great toe, sesamoid bones and plantar aspect of the head of the first metatarsal. Fluid in the sheath of the flexor tendon of the great toe could be sympathetic but is worrisome for septic tenosynovitis. First MTP joint effusion worrisome for septic joint. He was admitted to the hospital on 09/11/2016 and treated for a fever with vancomycin and cefepime. He was seen by Dr. Bobby Rumpf of infectious disease who recommended 6 weeks treatment with vancomycin and ceftazidime with his hemodialysis and to continue to see as in the wound clinic. Vascular consult was pending. The patient was discharged home on 09/14/2016 and he would continue with IV antibiotics for 6 weeks. 09/28/16 wound appears reasonably healthy. Continuing with total contact cast 10/05/16 wound is smaller and looking healthy. Continue with total contact cast. He continues on IV antibiotics 10/12/2016 -- he has developed a new wound on the dorsal aspect of his left big toe and this is a superficial injury with no  surrounding cellulitis. He has completed 30 days of IV antibiotics and is now ready to start his hyperbaric oxygen therapy as per his insurance company's recommendation. 10/19/2016 -- after the cast was removed on the right side he has got good resolution of his ulceration on the right plantar foot. he has a new wound on the left plantar foot in the region of his fourth metatarsal and this will need sharp debridement. 10/26/2016 -- Xray of the right foot complete: IMPRESSION: Changes consistent with osteomyelitis involving the head of the first metatarsal and base of the first proximal phalanx. The sesamoid bones are also likely involved given their positioning. 11/02/2016 -- there still awaiting insurance clearance for his hyperbaric oxygen therapy and hopefully he will begin treatment soon. 11/09/2016 -- started with hyperbaric oxygen therapy and had some barotrauma to the right ear and this was seen by ENT who was prescribed Afrin drops and would probably continue with HBO and he is scheduled for myringotomy tubes on Friday 11/16/2016 -- he had his myringotomy tubes placed on Friday and has been doing much better after that with some fluid draining out after hyperbaric treatment today. The pain was much better. 11/30/2016 -- over the last 2 days he noticed a swelling and change of color of his right second toe and this had been draining minimal fluid. 12/07/2016 -- x-ray of the right foot -- IMPRESSION:1. Progressive ulceration at the distal aspect of the second digit with significant soft tissue swelling and osseous changes  in the distal phalanx compatible with osteomyelitis. 2. Chronic osteomyelitis at the first MTP joint. 3. Ulcerations at the second and third toes as well without definite osseous changes. Osteomyelitis is not excluded. 4. Fifth digit amputation. On 12/01/2016 I spoke to Dr. Bobby Rumpf, the infectious disease specialist, who kindly agreed to treat this with IV  antibiotics and he would call in the order to the dialysis center and this has been discussed in detail with the patient who will make the appropriate arrangements. The patient will also book an appointment as soon as possible to see Dr. [name] in the office. Of note the patient has not been on antibiotics this entire week as the dialysis center did not receive any orders from Dr. [name]. I got in touch with Dr. [name] who tells me the patient has an appointment to see him this coming Wednesday. 12/14/2016 -- the patient is on ceftazidime and vancomycin during his dialysis, and I understand this was put on by his nephrologist Dr. Raliegh Ip possibly after speaking with infectious disease Dr. [name] 12/23/16; patient was on my schedule today for a wound evaluation as he had difficulties with his schedule earlier this week. I note that he is on vancomycin and ceftazidine at dialysis. In spite of this he arrives today with a new wound on the base of the right second toe this easily probes to bone. The known wound at the tip of the second toe With this area. He also has a superficial area on the medial aspect of the third toe on the side of the DIP. This does not appear to have much depth. The area on the plantar left foot is a deep area but did not probe the bone 12/28/16 -- he has brought in some lab work and the most recent labs done showed a hemoglobin of 12.1 hematocrit of 36.3, neutrophils of 51% WBC count of 5.6, BN of 53, albumin of 4.1 globulin of 3.7, vancomycin 13 g per mL. 01/04/2017 -- he saw Dr. [name] who has recommended a amputation of the right second toe and he is awaiting this date. He sees Dr. Bobby Rumpf of infectious disease tomorrow. The bone culture taken on 12/28/2016 had no growth in 2 days. 01/11/2017 -- was seen by Dr. Bobby Rumpf regarding the management and has recommended a eval by vascular surgeons. He recommended to continue the antibiotics during his  hemodialysis. Xray of the left foot -- IMPRESSION: No acute fracture, dislocation, or osseous erosion identified. 01/18/2017 -- he has his vascular workup later today and he is going to have his right foot second toe amputation this coming Friday by Dr. [name]. We have also put in for a another 30 treatments with hyperbaric oxygen therapy 01/25/2017 -- I have reviewed Dr. Donnetta Hutching is vascular report from last week where he reviewed him and thought his vascular function was good enough to heal his amputation site and no further tests were recommended. He also had his orthopedic related to surgery which is still pending the notes and we will review these next week. 02/01/2017 -- the operative remote of Dr. 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 02/08/2017 --he was seen by Dr. [name] on  02/03/2017 -- patient is on ceftaz and Vanco. after a thorough review he had recommended to continue with antibiotics during hemodialysis. The patient was seen by Dr. [name], but I do not find any follow-up note on the electronic medical record. he has removed the dressing over the right foot to remove the sutures and asked him to see him back in a month's time 02/22/2017 -- patient has been febrile and has been having symptoms of the upper respiratory tract infection but has not been checked for the flu and it's been over 4 days now. He says he is feeling better today. Some drainage between his left first and second toe and this needed to be looked at 03/08/2017 -- was seen by Dr. Bobby Rumpf on 03/07/2017 after review he will stop his antibiotics and see  him back in a month to see if he is feeling well. 03/15/2017 -- he has completed his course of hyperbaric oxygen therapy and is doing fine with his health otherwise. 03/22/2017 -- his nutritionist at the dialysis center has recommended a protein supplement to help build his collagen and we will prescribe this for him when he has the details 05/03/2017 -- he recently noticed the area on the plantar aspect of his fifth metatarsal head which opened out and had minimal drainage. 05/10/2017 -- -- x-ray of the left foot -- IMPRESSION:1. No convincing conventional radiographic evidence of active osteomyelitis.2. Active soft tissue ulceration at the tip of the fifth digit, and at the lateral aspect of the foot adjacent the base of the fifth metatarsal. 3. Surgical changes of prior fourth toe amputation. 4. 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 5. Small vessel atherosclerotic vascular calcifications. 6. 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 ======= Readmission after 5 weeks: 06/15/2017 -- the patient returns after 5 weeks having had an MRI done on 05/17/2017 MRI of the left foot without contrast showed soft tissue ulcer overlying the base of the fifth metatarsal. 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 No drainable fluid collection to suggest an abscess. He was in hospital between 05/16/2017 and 05/25/2017 -- and on discharge was asked to follow-up with Dr. [name] and Dr. [name]. He was treated 2 weeks post discharge with vancomycin plus ceftriaxone with hemodialysis and oral metronidazole 500 mg 3 times a day. 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 last hemoglobin A1c was 6.7% He had a postoperative MR of the foot on 05/23/2017 -- which showed no new areas of cortical bone loss,  edema or soft tissue ulceration to suggest osteomyelitis. the patient has completed his course of IV antibiotics during dialysis and is to see the infectious disease doctor tomorrow. Dr. [name] had seen him after suture removal and asked him to keep the wound with a dry dressing 06/21/2017 -- he was seen by Dr. Bobby Rumpf of infectious disease on 06/16/2017 -- he stopped his Flagyl and will see him back in 6 weeks. He was asked to follow-up with Dr. [name] and with the wound clinic clinic. 07/05/17; bone biopsy from last time showed acute osteomyelitis. This is from the reminiscent in the left fifth metatarsal. Culture result is apparently still pending [holding for anaerobe}. From my understanding in this case this is been a progressive necrotic wound which is deteriorated markedly over the last 3 weeks since he returned here.  He now has a large area of exposed bone which was biopsied and cultured last week. Dr. Graylon Good has put him on vancomycin and Fortaz during his hemodialysis and Flagyl orally. He is to see Dr. [name] next week 07/19/2017 -- the patient was reviewed by Dr. [name] of orthopedics who reviewed the case in detail and agreed with the plan to continue with IV antibiotics, aggressive wound care and hyperbaric oxygen therapy. He would see him back in 3 weeks' time 08/09/2017 -- saw Dr. Bobby Rumpf on 08/03/2017 -he was restarted on his antibiotics for 6 weeks which included vanco, ceftaz and Flagyl. he recommended continuing his antibiotics for 6 weeks and reevaluate his completion date. He would continue with wound care and hyperbaric oxygen therapy. 08/16/2017 -- I understand he will be completing his 6 weeks of antibiotics sometime later this week. 09/19/2017 -- he has been without his wound VAC for the last week due to lack of supplies. He has been packing his wound with silver alginate. 10/04/2017 -- he has an appointment with Dr. [name] tomorrow and hence we will not  apply the wound VAC after his dressing changes today. 10/11/2017 -- he was seen by Dr. Bobby Rumpf on 10/05/2017, noted that the patient was currently off antibiotics, and after thorough review he recommended he follow-up with the wound care as per plan and no further antibiotics at this point. 11/29/17; patient is arrived for the wound on his left lateral foot.Marland Kitchen He is completed IV antibiotics and 2 rounds of hyperbaric oxygen for treatment of underlying osteomyelitis. He arrives today with a surface on most of the wound area however our intake nurse noted drainage from the superior aspect. This was brought to my attention. 12/13/2017 -- the right plantar foot had a large bleb and once the bleb was opened out a large callus and subcutaneous debris was removed and he has a plantar ulcer near the fourth metatarsal head 12/28/17 on evaluation today patient appears to be doing acutely worse in regard to his left foot. The wound which has been appearing to do better as now open up more deeply there is bone palpable at the base of the wound unfortunately. He tells me that this feels "like it did when he had osteomyelitis previously" he also noted that his second toe on the left foot appears to be doing worse and is swollen there does appear to be some fluid collected underneath. His right foot plantar ulcer appears to be doing somewhat better at this point and there really is no complication at the site currently. No fevers, chills, nausea, or vomiting noted at this time. Patient states that he normally has no pain at this site however. Today he is having significant pain. 01/03/18; culture done last week showed methicillin sensitive staph aureus and group B strep. He was on Septra however he arrives today with a fever of 101. He has a functional dialysis shunt in his left arm but has had no pain here. No cough. He still makes some urine no dysuria he did have abdominal pain nausea and diarrhea over  the weekend but is not had any diarrhea since yesterday. Otherwise he has no specific complaints 01/05/18; the patient returns today in follow-up for his presentation from 1/8. At that point he was febrile. I gave him some Levaquin adjusted for his dialysis status. He tells me the fever broke that night and it is not likely that this was actually a wound infection. He is gone on to have an  MRI of the left foot; this showed interval development of an abnormal signal from the base of the third and fourth metatarsal and in the cuboid reminiscent consistent with osteomyelitis. There is no mention of the left second toe I think which was a concern when it was ordered. The patient is taking his Levaquin 01/10/18; the patient has completed his antibiotics today. The area on the left lateral foot is smaller but it still probes easily to bone. He has underlying osteomyelitis here and sees Dr. Megan Salon of infectious disease this week The area on the right third plantar metatarsal head still requires debridement not much change in dimensions He has a new wound on the medial tip of his right third toe again this probes to bone. He only noticed this 2 days ago 01/17/18; the patient saw Dr. [name] last week and he is back on Vanco and Fortaz at dialysis. 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 We are previously seeing him for a wound also on the base of the fourth med head on the right. X- ray of this area did not show osteomyelitis in relation to a new wound on the tip of his right third toe. Again this probes to bone. Finally his second toe on the left which didn't really show anything on the MRI at least no report appears to have a separated cutaneous area which I think is going to come right off the tip of his toe and leave exposed bone. Whether this is infectious or ischemic I am not  clear Culture I did from the third toe last week which was his new wound was negative. Plain x-ray on the right foot did not show osteomyelitis in the toes 01/24/18; the patient is going went to see Dr. [name]. He is going to have a amputation of the left second and right third toe although paradoxically the left second toe looks better than last week. We have treating a deep probing wound on the left lateral foot and the area over the third metatarsal head on the right foot. 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 Also a debridement including bone of the left lateral foot and a closure which is still sutured. This is a bit surprising. Also apparent debridement of the base of the right third toe wound. 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 I'm not sure exactly where we are in the frame of things here. The wound on the left lateral foot is still sutured also the amputation sites of the left second and right third toe. He is been using Xeroform to the sutured areas silver alginate to the right foot 02/14/18; he continues on Rocephin at dialysis for osteomyelitis. I still don't have a good sense of where we are in the treatment duration. The wounds on the left lateral foot had the sutures removed and this clearly still probes deeply. We debrided the area with a #5 curet. We'll use silver alginate to the wound the area over the right third met head also required debridement of callus skin and subcutaneous tissue and necrotic debris over the wound surface. This tunnels superiorly but I  did not unroofed this today. The areas on the tip of his toes have a crusted surface eschar I did not debridement this either. 02/21/18; he continues on Rocephin at dialysis for  osteomyelitis. I don't have a good sense of where we are and the treatment duration. The wounds on the left lateral foot is callused over on presentation but requires debridement. The area on the right third med head plantar aspect actually is measuring smaller 02/28/18;patient is on Vanco and Fortaz at dialysis, I'm not sure why I thought this was Rocephin above unless it is been changed. I don't have a good sense of time frame here. Using silver collagen to the area on the left lateral foot and right third med head plantar foot 03/07/18; patient is completing bank and Fortaz at dialysis soon. He is using Silver collagen to the area on the left lateral foot and the right third metatarsal head. He has a smaller superficial wound distal to the left lateral foot wound. 03/14/18-he is here in follow-up evaluation for multiple ulcerations to his bilateral feet. He presents with a new ulceration to the plantar aspect of the left foot underneath the blister, this was deroofed to reveal a partial thickness ulcer. He is voicing no complaints or concerns, tolerated dialysis yesterday. We will continue with same treatment plan and he will follow-up next week 03/21/18; the patient has a area on the lateral left foot which still has a small probing area. The overall surface area of the wound is better. He presented with a new ulceration on the left foot plantar fifth metatarsal head last week. The area on the right third metatarsal head appears smaller.using silver alginate to all wound areas. The patient is changing the dressing himself. He is using a Darco forefoot offloading are on the right and a healing sandal on the left 03/28/18; original wound left lateral foot. Still nowhere close to looking like a heeling surface secondary wound on the left lateral foot at the base of his question fifth metatarsal which was new last week Right third metatarsal head. We have been using silver alginate all wounds 04/04/18;  the original wound on the left lateral foot again heavy callus surrounding thick nonviable subcutaneous tissue all requiring debridement. The new wound from 2 weeks ago just near this at the base of the fifth metatarsal head still looks about the same Unfortunately there is been deterioration in the third medical head wound on the right which now probes to bone. I must say this was a superficial wound at one point in time and I really don't have a good frame of reference year. I'll have to go back to look through records about what we know about the right foot. 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 He is also been to see Dr. [name]. Somewhere in here as somebody is ordered a VASCULAR evaluation which was done on 03/22/18. On the right is anterior tibial artery was monophasic triphasic at the posterior tibial artery. On the left anterior tibial artery monophasic posterior tibial artery monophasic. ABI in the right was 1.43 on the left 1.21. TBIs on the right at 0.41 and on the left at 0.32. A vascular consult was recommended and I think has been arranged. 04/11/18; Mr. Eline has not had an MRI of the right foot since 2017. Recent x-ray of the right foot done in January and February was negative. However he  has had a major deterioration in the wound over the third met head. He is completed antibiotics last week at dialysis [place]. I think he is going to see vascular in follow-up. The areas on the left lateral foot and left plantar fifth metatarsal head both look satisfactory. I debrided both of these areas although the tissue here looks good. The area on the left lateral foot once probe to bone it certainly does not do that now 04/18/18; MRI of the right foot is on Thursday. He has what looks to be serosanguineous purulent drainage coming out of the wound over the right third metatarsal head  today. He completed antibiotics bank and Fortaz 2 weeks ago at dialysis. He has a vascular follow-up with regards to his arterial insufficiency although I don't exactly see when that is booked. The areas on the left foot including the lateral left foot and the plantar left fifth metatarsal head look about the same. 04/25/18-He is here in follow-up evaluation for bilateral foot ulcers. MRI obtained was negative for osteomyelitis. Wound culture was negative. We will continue with same treatment plan he'll follow-up next week 05/02/18; the right plantar foot wound over the third metatarsal head actually looks better than when I last saw this. His MRI was negative for osteomyelitis wound culture was negative. On the left plantar foot both wounds on the plantar fifth metatarsal head and on the lateral foot both are covered in a very hard circumferential callus. 05/09/18; right plantar foot wound over the third metatarsal head stable from last week. Left plantar foot wound over the fifth metatarsal head also stable but with callus around both wound areas The area on the left lateral foot had thick callus over a surface and I had some thoughts about leaving this intact however it felt boggy. We've been using silver alginate all wounds 05/16/18; since the patient was last here he was hospitalized from 5/15 through 5/19. 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 This grew MRSA. He was placed on vancomycin.MRI of the left foot suggested "progressive" osteomyelitis and bone destruction of the cuboid and the base of the fifth metatarsal. Stable erosive changes of the base of the second metatarsal. I'm wondering if they're aware that he had surgery and debridement in the area of the underlying bone previously by Dr. [name]. In any case, He was also revascularized with an angioplasty and stenting of the  left tibial peroneal trunk and angioplasty of the left posterior tibial artery. He is on vancomycin at dialysis. He has a 2 week follow-up with infectious disease. He has vascular surgery follow-up. He was started on Plavix. 05/23/18; the patient's wound on the lateral left foot at the level of the fifth metatarsal head and the plantar wound on the plantar fifth metatarsal head both look better. The area on the right third metatarsal head still has depth and undermining. We've been using silver alginate. He is on vancomycin. He has been revascularized on the left 05/30/18; he continues on vancomycin at dialysis. Revascularized on the left. The area on the left lateral foot is just about closed. Unfortunately the area over the plantar fifth metatarsal head undermining superiorly as does the area over the right third metatarsal head. Both of these significantly deteriorated from last week. He has appointments next week with infectious disease and orthopedics I delayed putting on a cast on the left until  those appointments which therefore we made we'll bring him in on Friday the 14th with the idea of a cast on the left foot 06/06/18; he continues on vancomycin at dialysis. Revascularized on the left. He arrives today with a ballotable swelling just above the area on the left lateral foot where his previous wound was. He has no pain but he is reasonably insensate. The difficult areas on the plantar left fifth metatarsal head looks stable whereas the area on the right third plantar metatarsal head about the same as last week there is undermining here although I did not unroofed this today. He required a considerable debridement of the swelling on the left lateral foot area and I unroofed an abscess with a copious amount of brown purulent material which I obtained for culture. I don't believe during his recent hospitalization he had any further imaging although I need to review this. Previous cultures  from this area done in this clinic showed MRSA, I would be surprised if this is not what this is currently even though he is on vancomycin. 06/13/18; he continues on vancomycin at dialysis however he finishes this on Sunday and then graduates the doxycycline previously prescribed by Dr. [name]. The abscess site on the lateral foot that I unroofed last time grew moderate amounts of methicillin-resistant staph aureus that is both vancomycin and tetracycline sensitive so we should be okay from that regard. He arrives today in clinic with a connection between the abscess site and the area on the lateral foot. We've been using silver alginate to all his wound areas. The right third metatarsal head wound is measuring smaller. Using silver alginate on both wound areas 06/20/18; transitioning to doxycycline prescribed by Dr. [name]. The abscess site on the lateral foot that I unroofed 2 weeks ago grew MRSA. That area has largely closed down although the lateral part of his wound on the fifth metatarsal head still probes to bone. I suspect all of this was connected. On the right third metatarsal head smaller looking wound but surrounded by nonviable tissue that once again requires debridement using silver alginate on both areas 06/27/18; on doxycycline 100 twice a day. The abscess on the lateral foot has closed down. Although he still has the wound on the left fifth metatarsal head that extends towards the lateral part of the foot. The most lateral part of this wound probes to bone Right third metatarsal head still a deep probing wound with undermining. Nevertheless I elected not to debridement this this week If everything is copious static next week on the left I'm going to attempt a total contact cast 07/04/18; he is tolerating his doxycycline. The abscess on the left lateral foot is closed down although there is still a deep wound here that probes to bone. We will use silver collagen under the total  contact cast Silver collagen to the deep wound over the right third metatarsal head is well 07/11/18; he is tolerating doxycycline. The left lateral fifth plantar metatarsal head still probes deeply but I could not probe any bone. We've been using silver collagen and this will be the second week under the total contact cast Silver collagen to deep wound over the right third metatarsal head as well 07/18/18; he is still taking doxycycline. The left lateral fifth plantar metatarsal this week probes deeply with a large amount of exposed bone. Quite a deterioration. He required extensive debridement. Specimens of bone for pathology and CNS obtained Also considerable debridement on the right third metatarsal head  07/25/18; bone for pathology last week from the lateral left foot showed osteomyelitis. CandS of the bone Enterobacter. And Klebsiella. He is now on ciprofloxacin and the doxycycline is stopped [Dr. [name] of infectious disease] area He has an appointment with Dr. [name] tomorrow I'm going to leave the total contact cast off for this week. May wish to try to reapply that next week or the week after depending on the wound bed looks. I'm not sure if there is an operative option here, previously is followed with Dr. [name] 08/01/18 on evaluation today patient presents for reevaluation. He has been seen by infectious disease, Dr. [name], and he has placed the patient back on Ceftaz currently to be given to him at dialysis three times a week. 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 Fortunately there does not appear to be the evidence of infection at this point in time. Overall the patient has been tolerating the dressing changes without complication. We are no longer due lives in the cast of left foot secondary to the infection obviously. 08/10/18 on evaluation today patient appears to be doing rather well all things considered  in regard to his left plantar foot ulcer. He does have a significant callous on the lateral portion of his left foot he wonders if I can help clean that away to some degree today. Fortunately he is having no evidence of infection at this time which is good news. No fevers chills noted. With that being said his right plantar foot actually does have a significant callous buildup around the wound opening this seems to not be doing as well as I would like at this point. I do believe that he would benefit from sharp debridement at this site. Subsequently I think a total contact cast would be helpful for the right foot as well. 08/17/18; the patient arrived today with a total contact cast on the right foot. This actually looks quite good. He had gone to see Dr. Megan Salon of infectious disease about the osteomyelitis on the left foot. I think he is on IV Fortaz at dialysis although I am not exactly sure of the rationale for the Fortaz at the time of this dictation. He also arrived in today with a swelling on the lateral left foot this is the site of his original wounds in fact when I first saw this man when he was under Dr. Ardeen Garland care I think this was the site of where the wound was located. It was not particularly tender however using a small scalpel I opened it to [place] some moderate amount of purulent drainage. 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 Last MRI was in May that showed progressive osteomyelitis and bone destruction of the cuboid and base of the fifth metatarsal. Last week he had an abscess over this same area this was removed. Culture was negative. We are continuing with a total contact cast to the area on the third metatarsal head on the right and making some good progress here. The patient asked me again about renal transplant. He is not on the list because  of the open wounds. 08/31/18; apparently the patient had an interruption in the [place] but he is now back on this at dialysis. Apparently there was a misunderstanding and Dr. Crissie Figures orders. Due to have the  MRI of the left foot tonight. The area on the right foot continues to have callus thick skin and subcutaneous tissue around the wound edge that requires constant debridement however the wound is smaller we are using a total contact cast in this area. Alginate all wounds 09/07/18; without much surprise the MRI of his left foot showed osteomyelitis in the reminiscent of his fourth metatarsal but also the third metatarsal. She arrives in clinic today with the right foot and a total contact cast. There was purulent drainage coming out of the wound which I have cultured. Marked deterioration here with undermining widely around the wound orifice. Using pickups and a scalpel I remove callus and subcutaneous tissue from a substantial new opening. In a similar fashion the area on the left lateral foot that was blistered last week I have opened this and remove skin and subcutaneous tissue from this area to expose a obvious new wound. I think there is extension and communication between all of this on the left foot. The patient is on Fortaz at dialysis. Culture done of the right foot. We are clearly not making progress here. We have made him an appointment with Dr. [name] of orthopedics. I think an amputation of the left leg may be discussed. I don't think there is anything that can be done with foot salvage. 09/14/18; culture from the right foot last time showed a very resistant MRSA. This is resistant to doxycycline. I'm going to try to get linezolid at least for a week. He does not have a appointment with Dr. [name] yet. 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 I'll send a message to Dr. [name] of infectious disease. I want the patient to see  Dr. [name] to go over the pros and cons of an amputation which I think will be a BKA. The patient had a question about whether this is curative or not. I told him that I thought it would be although spread of staph aureus infection is not unheard of. MRI of the right foot was done at the end of April 2019 did not show osteomyelitis. The patient last saw Dr. [name] on 9/3. I'll send Dr. [name] a message. I would really like him to weigh the pros and cons of a BKA on the left otherwise he'll probably need IV antibiotics and perhaps hyperbaric oxygen again. He has not had a good response to this in the past. His MRI earlier this month showed progressive damage in the remnants of the fourth and third metatarsal 09/21/2018; sees Dr. [name] of orthopedics next week to discuss a left BKA in response to the osteomyelitis in the left foot. Using silver alginate to all wound areas. He is completing the Zyvox I did put in for him after last culture showed MRSA 09/29/2018; patient saw Dr. [name] of orthopedics discussed the osteomyelitis in the left foot third and fourth metatarsals. He did not recommend urgent surgery but certainly stated the only surgical option would be a BKA. He is communicating with Dr. [name]. The problem here is the instability of the areas on his foot which constantly generate draining abscesses. I will communicate with Dr. [name] about this. He has an appointment on 10/23 10/05/2018; patient sees Dr. [name] on 10/23. I have sent him a secure message not ordered any additional antibiotics for now. Patient continues to have 2 open areas on the left plantar foot at the fifth metatarsal head and the  lateral aspect of the foot. These have not changed all that much. The area on the right third met head still has thick callus and surrounding subcutaneous tissue we have been using silver alginate 10/12/2018; patient sees Dr. [name] or colleague on 10/23. 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 The area on the right plantar foot is just about closed. The superior wound on the left fifth metatarsal head is callused over but I am not sure if this is closed. The area below it is about the same. Considerable amount of callus on the lateral foot. We have been using silver alginate to the wounds 10/19/2018; the patient saw Dr. [name] on 10/22. He wishes to try and continue to save the left foot. He has been given 6 weeks of oral ciprofloxacin. We continue to put a total contact cast on the right foot. The area over the fifth metatarsal head on the left is callused over/perhaps healed but I did not remove the callus to find out. He still has the wound on the left lateral midfoot requiring debridement. We have been using silver alginate to all wound areas 10/26/2018 On the left foot our intake nurse noted some purulent drainage from the inferior wound [currently the only one that is not callused over] On the right foot even though he is in total contact cast a considerable amount of thick black callus and surface eschar. On this side we have been using silver alginate under a total contact cast. he remains on ciprofloxacin as prescribed by Dr. [name] 11/02/2018; culture from last week which is done of the probing area on the left midfoot wound showed MRSA "few". I am going to need to contact Dr. [name] which I will do today I think he is going to need IV antibiotics again. The area on the left foot which was callused on the side is clearly separating today all of this was removed a copious amounts of callus and necrotic subcutaneous tissue. The area on the right plantar foot actually remained healthy looking with a healthy granulated base. We are using silver alginate to all wound areas 11/09/2018; unfortunately neither 1 of the patient's wounds areas looks at all satisfactory. On the left he has considerable  necrotic debris from the plantar wound laterally over the foot. I removed copious amounts of material including callus skin and subcutaneous tissue. On the right foot he arrives with undermining and frankly purulent drainage. Specimen obtained for culture and debridement of the callused skin and subcutaneous tissue from around the circumference. Because of this I cannot put him back in a total contact cast but to be truthful we are really unfortunately not making a lot of progress I did put in a secure text message to Dr. [name] wondering about the MRSA on the left foot. He suggested vancomycin although this is not started. I was left wondering if he expected me to send this into dialysis. Has we have more purulence on the right side wait for that result before calling dialysis 11/16/2018; I called dialysis earlier this week to get the vancomycin started. I think he got the first dose on Tuesday. The patient tells me that he fell earlier this week twisting his left foot and ankle and he is swelling. He continues to not look well. He had blood cultures done at dialysis on Tuesday he is not been informed of the results. 12/07/2018; the patient was admitted to hospital from  11/22/2018 through 12/02/2018. He underwent a left BKA. Apparently had staph sepsis. In the meantime being off the right foot this is closed over. He follows up with Dr. 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 Subsequent to this he actually had multiple falls in fact he tells me to following which calls the wound to be his apparently. He has been seeing Dr. [name] and his physician assistant in the interim. They actually did want him to come back for reevaluation to see if there's anything we can do for me wound care perspective the help this area to heal more appropriately. The patient states that he does not have  a tremendous amount of pain which is good news. No fevers, chills, nausea, or vomiting noted at this time. We have gotten approval from Dr. [name] office had [place] for [place] to treat the patient for his stop wound. This is due to the fact that the patient was in the 90 day postop global. 1/21; the patient does not have an open wound on the right foot although he does have some pressure areas that will need to be padded when he is transferring. The dehisced surgical wound looks clean although there is some undermining of 1.5 cm superiorly. 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 This appears healthy but there is undermining. TheraSkin #1 2/11; TheraSkin #2 2/25; TheraSkin #3. Wound is measuring smaller 3/10; TheraSkin #4 wound is measuring smaller 3/24; TheraSkin #5 wound is measuring smaller and looks healthy. 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 He has no open area on the right foot although he does have some callused area over the original third metatarsal head wound. The third metatarsal is also subluxed on the right foot. I have warned him today that he cannot consider wearing a prosthesis for at least a month but he can go for measurements. He is going to need to keep the right foot padded is much as possible in his diabetic shoe indefinitely READMISSION 06/12/2019 Mr. Schrum is a type II diabetic on dialysis. He has been in this clinic multiple times with wounds on his bilateral lower extremities. 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 He also has a history of wounds on his right foot but tells me that everything is going well here. He actually came in here with his prosthesis walking with a cane. We were all really quite gratified to see this He states over the  last several weeks he has had a painful area at the tip of the left third finger. His dialysis shunt in his is in the left upper arm and this particularly hurts during dialysis when his fingers get numb and there is a lot of pain in the tip of his left third finger. I believe he is seen vascular surgery. He had a Doppler done to evaluate for a dialysis steal syndrome. He is going for a banding procedure 1 week from today towards the left AV fistula. I think if that is unsuccessful they will be looking at creating a new shunt. He had a course of doxycycline when he which he finished about a week ago. He has been using  Bactroban to the finger 6/25; the patient had his banding procedure and things seem to be going better. He is not having pain in his hand at dialysis. The area on the tip of his finger seems about closed. He did however have a paronychia I the last time I saw him. I have been advising him to use topical Bactroban washing the finger. Culture I did last time showed a few staph epidermidis which I was willing to dismiss as superficial skin contaminant. He arrives today with the finger wound looking better however the paronychia a seems worse 7/9; 2-week follow-up. He does not have an open wound on the tip of his third left finger. I thought he had an initial ischemic wound on the tip of the finger as well as a paronychia a medially. All of this appears to be closed. 8/6-Patient returns after 3 weeks for follow-up and has a right second met head plantar callus with a significant area of maceration hiding an ulcer ABI repeated today on right - 1.26 8/13-Patient returns at 1 week, the right second met head plantar wound appears to be slightly better, it is surrounded by the callus area we are using silver alginate 8/20; the patient came back to clinic 2 weeks ago with a wound on the right second metatarsal head. He apparently told me he developed callus in this area but also went away from  his custom shoes to a pair of running shoes. Using silver alginate. 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 There might be much we can do to offload this although the patient tells me he has a wheelchair that he is using to try and stay off the wound is much as possible 8/27; right second metatarsal head. Still small punched out area with thick callus and subcutaneous tissue around the wound. We have been using silver collagen. This is always been an issue with this man's wound especially on this foot 9/3; right second met head. Still the same punched-out area with thick callus and subcutaneous tissue around the wound removing the circumference demonstrates repetitively undermining area. I have removed all the subcutaneous tissue associated with this. We have been using silver collagen 9/15; right second metatarsal head. Same punched-out thick callus and subcutaneous tissue around the wound area. Still requiring debridement we have been using silver collagen I changed back to silver alginate X-ray last time showed no evidence of osteomyelitis. Culture showed Klebsiella and he was started and Keflex apparently just 4 days ago 9/24; right second metatarsal head. He is completed the antibiotics I gave him for 10 days. Same punched-out thick callus and subcutaneous tissue around the area. Still requiring debridement. We have been using silver alginate. The area itself looks swollen and this could be just [place] that comes on this area with walking on a prosthesis on the left however I think he needs an MRI and I am going to order that today. There is not an option to offload this further 10/1; right second plantar metatarsal head. MRI booked for October 6. Generally looking better today using silver alginate 10/8; right second plantar metatarsal head. MRI that was done on 10/6 was negative for osteomyelitis noted prior amputations of the second and fourth toes at the [facility].  Prior amputation of the third toe to the base of the middle phalanx. Prior amputation of the fifth toe to the head of the metatarsal. They also noted a partially non-united stress fracture at the base  of the third metatarsal. He does not recall about hearing about this. He did not have any pain but then again he has reduced sensation from diabetic neuropathy 10/15; right second plantar metatarsal head. Still in the same condition callus nonviable subcutaneous tissue which cleans up nicely with debridement but reforms by the next week. The patient tells me that he is offloading this is much as he can including taking his wheelchair to dialysis. We have been using silver alginate, changed to silver collagen today 10/22; right second plantar metatarsal head. Wound measures smaller but still thick callus around this wound. Still requiring debridement 11/5 right second plantar metatarsal head. Less callus around the wound circumference but still requiring debridement. There is still some undermining which I cleaned out the wound looks healthy but still considerable punched out depth with a relatively small circumference to the wound there is no palpable bone no purulent drainage 11/12; right second plantar metatarsal head. Small wound with thick callus tissue around the wound and significant undermining. We have been using silver collagen after my continuous debridements of this area 11/19; patient arrives in today with purulent drainage coming out of the wound in the second third met head area on the right foot. Serosanguineous thick drainage. Specimen obtained for culture. 11/21/2019 on evaluation today patient appears to be doing well with regard to his foot ulcer. The good news is is culture came back negative for any bacteria there was no growth. The other good news is his x-ray also appears to be doing well. The foot is also measuring much better than what it was. Overall I am very pleased with  how things seem to be progressing. 12/22; patient was in [facility] for several weeks due to the death of a family member. He said he stayed off the wound using silver alginate. 01/03/2020. Patient has a small open area with roughly 0.3 cm of circumferential undermining. The orifice looks smaller. We have been using silver collagen. There is not a wound more aggressive way to offload this area as he has a prosthesis on the other leg 1/21; again the same small area is 2 weeks ago. We have been using silver collagen I change that to endoform today I really believe that this is an offloading issue 2/4; the area on the right third met head. We have been using endoform. 2/11 the area on the right third met head we have been using silver alginate. 2/25; the area on the right third met head. There is much less callus on this today. The patient states he is offloading this even more aggressively. As an example he is taking his wheelchair into dialysis on most days 3/4; right third metatarsal head. Again he has eschar over the surface of the wound which I removed with a #3 curette. Some subcutaneous debris. This has 0.8 cm of direct probing depth. I cannot feel any bone here. I did do a culture the area 3/11; right third metatarsal head. Again an eschared area over the wound which almost makes this look superficial. Again debridement reveals a wound with probing depth probably not much different from last week there is no palpable bone no palpable purulence. The patient tells me that he is making every effort to offload this area properly. He is taking wheelchair into dialysis etc. There is no option for forefoot offloading or a total contact cast. We used Oasis #1 today 3/18; again callus and eschar over the wound surface. I remove this but still a  probing wound although only 3 mm in depth this time. This is an improvement 3/25; again callus and eschar over the wound surface which I removed the  wound is much deeper today at 0.7 cm. There is no palpable bone. Because of the appearance of this a culture was done. I am not going to put the Oasis back in this. Concerned about underlying infection. Notable for the fact that he is just finished doxycycline for the last go round of this 3/30; still 0.7 cm in depth which is disappointing. Culture I gave last time showed a few Staph aureus which is methicillin susceptible. This should have been taken care of by the recent course of doxycycline I gave him. We are using silver alginate 4/6; almost 1 cm in depth. We have been using silver alginate with underlying Bactroban. Electronic Signature(s) Signed: 04/01/2020 6:13:52 PM By: Linton Ham MD Entered By: Linton Ham on 04/01/2020 16:02:20 -------------------------------------------------------------------------------- Physical Exam Details Patient Name: Date of Service: [name], [name] 04/01/2020 2:15 PM Medical Record [id number] Patient Account Number: [id number] Date of Birth/Sex: Treating RN: [birth year] (68 y.o. [name] Primary Care Provider: [name] Other Clinician: Referring Provider: Treating Provider/Extender:Peja Allender, Jeronimo Greaves, Buena Irish in Treatment: 42 Cardiovascular Pedal pulses are palpable on the right. Notes Wound exam; callused over callus skin debris removed with a #3 curette. There is a 1 cm probing depth of this this does not show to bone probably some degree of undermining but it is difficult to measure given the small surface area Electronic Signature(s) Signed: 04/01/2020 6:13:52 PM By: Linton Ham MD Entered By: Linton Ham on 04/01/2020 16:03:35 -------------------------------------------------------------------------------- Physician Orders Details Patient Name: Date of Service: [name], [name] 04/01/2020 2:15 PM Medical Record [id number] Patient Account Number: [id number] Date of Birth/Sex: Treating  RN: Mar 27, 1951 (68 y.o. 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 - to see Margarita Grizzle. Dressing Change Frequency Wound #41 Right Metatarsal head second Change dressing every day. Wound Cleansing May shower and wash wound with soap and water. Primary Wound Dressing Wound #41 Right Metatarsal head second Calcium Alginate with Silver - apply thin layer of bactroban then pack calcium alginate into wound bed. Secondary Dressing Wound #41 Right Metatarsal head second Foam - foam donut Kerlix/Rolled Gauze Dry Gauze Other: - felt to periwound and place one below wound. lightly wrap coban to hold dressing in place. felt / pad wound Edema Control Avoid standing for long periods of time Elevate legs to the level of the heart or above for 30 minutes daily and/or when sitting, a frequency of: - throughout the day. Off-Loading Other: - felt to periwound and place one below wound. Additional Orders / Instructions Other: - minimize walking and standing on foot to aid in wound healing and callus buildup. Electronic Signature(s) Signed: 04/01/2020 6:13:52 PM By: Linton Ham MD Signed: 04/01/2020 6:43:44 PM By: Carlene Coria RN Entered By: Carlene Coria on 04/01/2020 15:33:32 -------------------------------------------------------------------------------- Problem List Details Patient Name: Date of Service: [name], [name] 04/01/2020 2:15 PM Medical Record [id number] Patient Account  Number: [id number] Date of Birth/Sex: Treating RN: [birth year] (69 y.o. 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, 06/12/2019 No Yes other extremity L03.115 Cellulitis of right lower limb 11/15/2019 No Yes Inactive Problems ICD-10 Code Description Active Date Inactive Date L03.114 Cellulitis of left upper limb 06/12/2019 06/12/2019 L98.498 Non-pressure chronic ulcer of skin of other sites with other 06/12/2019 06/12/2019 specified severity S61.209D Unspecified open wound of unspecified finger without damage 06/12/2019 06/12/2019 to nail, subsequent encounter Resolved Problems Electronic Signature(s) Signed: 04/01/2020 6:13:52 PM By: Linton Ham MD Entered By: Linton Ham on 04/01/2020 15:58:58 -------------------------------------------------------------------------------- Progress Note Details Patient Name: Date of Service: [name] 04/01/2020 2:15 PM Medical Record [id number] Patient Account Number: [id number] Date of Birth/Sex: Treating RN: [birth year] (68 y.o. [name] Primary Care Provider: [name] Other Clinician: Referring Provider: Treating Provider/Extender:Alaysiah Browder, Jeronimo Greaves, Buena Irish in Treatment: 42 Subjective History of Present Illness (HPI) The following HPI elements were documented for the patient's wound: Location: Patient presents with a wound to bilateral feet. Quality: Patient reports experiencing essentially no pain. Severity: Mildly severe wound with no evidence of  infection Duration: Patient has had the wound for greater than 2 weeks prior to presenting for treatment The patient is a pleasant 69 yrs old bm here for evaluation of ulcers on the plantar aspect of both feet. 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 He has a left arm graft for access. He has been trying to stay off his feet for weeks but does not seem to have any improvement in the wound. He has been seeing someone at the foot center and was referred to the Wound Care center for further evaluation. 12/30/15 the patient has 3 wounds one over the right first metatarsal head and 2 on the left foot at the left fifth and lleft first metatarsal head. All of these look relatively similar. The one over the left fifth probe to bone I could not prove that any of the others did. I note his MRI in November that did not show osteomyelitis. His peripheral pulses seem robust. All of these underwent surgical debridement to remove callus nonviable skin and subcutaneous tissue 01/06/16; the patient had his sutures removed from the right fifth ray amputation. There may be a small open part of this superiorly but otherwise the incision looks good. Areas over his right first, left first and left fifth metatarsal head all underwent surgical debridement as a varying degree of callus, skin and nonviable subcutaneous tissue. The area that is most worrisome is the right fifth metatarsal head which has a wound probes precariously close to bone. There is no purulent drainage or erythema 01/13/16; I'm not exactly sure of the status of the right fifth ray amputation site however he follows with Dr. [name] later this week. The area over his right first plantar metatarsal head, left first and fifth plantar metatarsal head are all in the same status. Thick circumferential callus, nonviable subcutaneous tissue. Culture of the left fifth did not culture last week 01/20/16; all of  the patient's wounds appear and roughly the same state although his amputation site on the right lateral foot looks better. His wounds over the  right first, left first and left fifth metatarsal heads all underwent difficult surgical debridement removing circumferential callus nonviable skin and subcutaneous tissue. There is no overt evidence of infection in these areas. MRI at the end of December of the left foot did not show osteomyelitis, right foot showed osteomyelitis of the right fifth digit he is now status post amputation. 01/27/16 the patient's wounds over his plantar first and fifth metatarsal heads on the left all appear better having been started on a total contact cast last week. They were debridement of circumferential callus and nonviable subcutaneous tissue as was the wound over the first metatarsal head. His surgical incision on the right also had some light surface debridement done. 03/23/2016 -- the patient was doing really well and most of his wounds had almost completely healed but now he came back today with a history of having a discharge from the area of his right foot on the plantar aspect and also between his first and second toe. He also has had some discharge from the left foot. Addendum: I spoke to the PA [name] at the dialysis center whose fax number is [phone]. We discussed the infection the patient has and she will put the patient on vancomycin and Fortaz until the final culture report is back. We will fax this as soon as available. 03/30/2016 -- left foot x-ray IMPRESSION:No definitive osteomyelitis noted. X-ray of the right foot -- IMPRESSION: 1. Soft tissue swelling. Prior amputation right fifth digit. No acute or focal bony abnormality identified. If osteomyelitis remains a clinical concern, MRI can be obtained. 2. Peripheral vascular disease. His culture reports have grown an MSSA -- and we will fax this report to his hemodialysis center. He was  called in a prescription of oral doxycycline but I have told her not to fill this in as he is already on IV antibiotics. 04/06/2016 -- a few days ago, I spoke to the hemodialysis center nurse who had stopped the IV antibiotics and he was given a prescription for doxycycline 100 mg by mouth twice a day for a week and he is on this at the present time. 05/11/2016 -- he has recently seen his PCP this week and his hemoglobin A1c was 7. He is working on his paperwork to get his orthotic shoes. 05/25/2016 -- -- x-ray of the left foot IMPRESSION:No acute bony abnormality. No radiographic changes of acute osteomyelitis. No change since prior study. X-ray of the right foot -- IMPRESSION: Postsurgical changes are seen involving the fifth toe. No evidence of acute osteomyelitis. he developed a large blister on the medial part of his right foot and this opened out and drain fluid. 06/01/2016 -- he has his MRI to be done this afternoon. 06/15/2016 -- MRI of the left forefoot without contrast shows 2 separate regions of cutaneous and subcutaneous edema and possible ulceration and blistering along the ball of the foot. No obvious osteomyelitis identified. MRI of the right foot showed cutaneous and subcutaneous thickening plantar to the first digit sesamoid with an ulcer crater but no underlying osteomyelitis is identified. 06/22/2016 -- the right foot plantar ulcer has been draining a lot of seropurulent material for the last few days. 06/30/2016 -- spoke to the dialysis center and I believe I spoke to [place] a PA at the center who discussed with me and agreed to putting Legrand Como on vancomycin until his cultures arrive. On review of his culture report no WBCs were seen or no organisms were seen and the culture was  reincubated for better growth. The final report is back and there were no predominant growth including Streptococcus or Staphylococcus. Clinically though he has a lot of drainage from both wounds a  lot of undermining and there is further blebs on the left foot towards the interspace between his first and second toe. 08/10/2016 -- the culture from the right foot showed normal skin flora and there was no Staphylococcus aureus was group A streptococcus isolated. 09/21/2016 -- -- MRI of the right foot was done on 09/13/2016 - IMPRESSION: Findings most consistent with acute osteomyelitis throughout the great toe, sesamoid bones and plantar aspect of the head of the first metatarsal. Fluid in the sheath of the flexor tendon of the great toe could be sympathetic but is worrisome for septic tenosynovitis. First MTP joint effusion worrisome for septic joint. He was admitted to the hospital on 09/11/2016 and treated for a fever with vancomycin and cefepime. He was seen by Dr. Bobby Rumpf of infectious disease who recommended 6 weeks treatment with vancomycin and ceftazidime with his hemodialysis and to continue to see as in the wound clinic. Vascular consult was pending. The patient was discharged home on 09/14/2016 and he would continue with IV antibiotics for 6 weeks. 09/28/16 wound appears reasonably healthy. Continuing with total contact cast 10/05/16 wound is smaller and looking healthy. Continue with total contact cast. He continues on IV antibiotics 10/12/2016 -- he has developed a new wound on the dorsal aspect of his left big toe and this is a superficial injury with no surrounding cellulitis. He has completed 30 days of IV antibiotics and is now ready to start his hyperbaric oxygen therapy as per his insurance company's recommendation. 10/19/2016 -- after the cast was removed on the right side he has got good resolution of his ulceration on the right plantar foot. he has a new wound on the left plantar foot in the region of his fourth metatarsal and this will need sharp debridement. 10/26/2016 -- Xray of the right foot complete: IMPRESSION: Changes consistent with osteomyelitis  involving the head of the first metatarsal and base of the first proximal phalanx. The sesamoid bones are also likely involved given their positioning. 11/02/2016 -- there still awaiting insurance clearance for his hyperbaric oxygen therapy and hopefully he will begin treatment soon. 11/09/2016 -- started with hyperbaric oxygen therapy and had some barotrauma to the right ear and this was seen by ENT who was prescribed Afrin drops and would probably continue with HBO and he is scheduled for myringotomy tubes on Friday 11/16/2016 -- he had his myringotomy tubes placed on Friday and has been doing much better after that with some fluid draining out after hyperbaric treatment today. The pain was much better. 11/30/2016 -- over the last 2 days he noticed a swelling and change of color of his right second toe and this had been draining minimal fluid. 12/07/2016 -- x-ray of the right foot -- IMPRESSION:1. Progressive ulceration at the distal aspect of the second digit with significant soft tissue swelling and osseous changes in the distal phalanx compatible with osteomyelitis. 2. Chronic osteomyelitis at the first MTP joint. 3. Ulcerations at the second and third toes as well without definite osseous changes. Osteomyelitis is not excluded. 4. Fifth digit amputation. On 12/01/2016 oo I spoke to Dr. Bobby Rumpf, the infectious disease specialist, who kindly agreed to treat this with IV antibiotics and he would call in the order to the dialysis center and this has been discussed in detail with the patient  who will make the appropriate arrangements. The patient will also book an appointment as soon as possible to see Dr. [name] in the office. Of note the patient has not been on antibiotics this entire week as the dialysis center did not receive any orders from Dr. [name]. I got in touch with Dr. [name] who tells me the patient has an appointment to see him this coming Wednesday. 12/14/2016 --  the patient is on ceftazidime and vancomycin during his dialysis, and I understand this was put on by his nephrologist Dr. Raliegh Ip possibly after speaking with infectious disease Dr. [name] 12/23/16; patient was on my schedule today for a wound evaluation as he had difficulties with his schedule earlier this week. I note that he is on vancomycin and ceftazidine at dialysis. In spite of this he arrives today with a new wound on the base of the right second toe this easily probes to bone. The known wound at the tip of the second toe With this area. He also has a superficial area on the medial aspect of the third toe on the side of the DIP. This does not appear to have much depth. The area on the plantar left foot is a deep area but did not probe the bone 12/28/16 -- he has brought in some lab work and the most recent labs done showed a hemoglobin of 12.1 hematocrit of 36.3, neutrophils of 51% WBC count of 5.6, BN of 53, albumin of 4.1 globulin of 3.7, vancomycin 13 g per mL. 01/04/2017 -- he saw Dr. [name] who has recommended a amputation of the right second toe and he is awaiting this date. He sees Dr. Bobby Rumpf of infectious disease tomorrow. The bone culture taken on 12/28/2016 had no growth in 2 days. 01/11/2017 -- was seen by Dr. Bobby Rumpf regarding the management and has recommended a eval by vascular surgeons. He recommended to continue the antibiotics during his hemodialysis. Xray of the left foot -- IMPRESSION: No acute fracture, dislocation, or osseous erosion identified. 01/18/2017 -- he has his vascular workup later today and he is going to have his right foot second toe amputation this coming Friday by Dr. [name]. We have also put in for a another 30 treatments with hyperbaric oxygen therapy 01/25/2017 -- I have reviewed Dr. Donnetta Hutching is vascular report from last week where he reviewed him and thought his vascular function was good enough to heal his amputation site and no further  tests were recommended. He also had his orthopedic related to surgery which is still pending the notes and we will review these next week. 02/01/2017 -- the operative remote of Dr. 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 02/08/2017 --he was seen by Dr. [name] on 02/03/2017 -- patient is on Strawberry. after a thorough review he had recommended to continue with antibiotics during hemodialysis. The patient was seen by Dr. [name], but I do not find any follow-up note on the electronic medical record. he has removed the dressing over the right foot to remove the sutures and asked him to see him back in a month's time 02/22/2017 -- patient has been febrile and has been having symptoms of the upper  respiratory tract infection but has not been checked for the flu and it's been over 4 days now. He says he is feeling better today. Some drainage between his left first and second toe and this needed to be looked at 03/08/2017 -- was seen by Dr. Bobby Rumpf on 03/07/2017 after review he will stop his antibiotics and see him back in a month to see if he is feeling well. 03/15/2017 -- he has completed his course of hyperbaric oxygen therapy and is doing fine with his health otherwise. 03/22/2017 -- his nutritionist at the dialysis center has recommended a protein supplement to help build his collagen and we will prescribe this for him when he has the details 05/03/2017 -- he recently noticed the area on the plantar aspect of his fifth metatarsal head which opened out and had minimal drainage. 05/10/2017 -- -- x-ray of the left foot --  IMPRESSION:1. No convincing conventional radiographic evidence of active osteomyelitis.2. Active soft tissue ulceration at the tip of the fifth digit, and at the lateral aspect of the foot adjacent the base of the fifth metatarsal. 3. Surgical changes of prior fourth toe amputation. 4. 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 5. Small vessel atherosclerotic vascular calcifications. 6. 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 ======= Readmission after 5 weeks: 06/15/2017 -- the patient returns after 5 weeks having had an MRI done on 05/17/2017 MRI of the left foot without contrast showed soft tissue ulcer overlying the base of the fifth metatarsal. 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 No drainable fluid collection to suggest an abscess. He was in hospital between 05/16/2017 and 05/25/2017 -- and on discharge was asked to follow-up with Dr. [name] and Dr. [name]. He was treated 2 weeks post discharge with vancomycin plus ceftriaxone with hemodialysis and oral metronidazole 500 mg 3 times a day. 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 last hemoglobin A1c was 6.7% He had a postoperative MR of the foot on 05/23/2017 -- which showed no new areas of cortical bone loss, edema or soft tissue ulceration to suggest osteomyelitis. the patient has completed his course of IV antibiotics during dialysis and is to see the infectious disease doctor tomorrow. Dr. [name] had seen him after suture removal and asked him to keep the wound with a dry dressing 06/21/2017 -- he was seen by Dr. Bobby Rumpf of infectious disease on 06/16/2017 -- he stopped his Flagyl and will see him back in 6 weeks. He was asked to follow-up with Dr. [name] and with the wound clinic clinic. 07/05/17; bone biopsy from  last time showed acute osteomyelitis. This is from the reminiscent in the left fifth metatarsal. Culture result is apparently still pending [holding for anaerobe}. From my understanding in this case this is been a progressive necrotic wound which is deteriorated markedly over the last 3 weeks since he returned here. He now has a large area of exposed bone which was biopsied and cultured last week. Dr. Graylon Good has put him on vancomycin and Fortaz during his hemodialysis and Flagyl orally. He is to see Dr. [name] next week 07/19/2017 -- the patient was reviewed by Dr. [name] of orthopedics who reviewed the case in detail and agreed with the plan to continue with IV antibiotics, aggressive wound care and hyperbaric oxygen therapy. He would see him back in  3 weeks' time 08/09/2017 -- saw Dr. Bobby Rumpf on 08/03/2017 -he was restarted on his antibiotics for 6 weeks which included vanco, ceftaz and Flagyl. he recommended continuing his antibiotics for 6 weeks and reevaluate his completion date. He would continue with wound care and hyperbaric oxygen therapy. 08/16/2017 -- I understand he will be completing his 6 weeks of antibiotics sometime later this week. 09/19/2017 -- he has been without his wound VAC for the last week due to lack of supplies. He has been packing his wound with silver alginate. 10/04/2017 -- he has an appointment with Dr. [name] tomorrow and hence we will not apply the wound VAC after his dressing changes today. 10/11/2017 -- he was seen by Dr. Bobby Rumpf on 10/05/2017, noted that the patient was currently off antibiotics, and after thorough review he recommended he follow-up with the wound care as per plan and no further antibiotics at this point. 11/29/17; patient is arrived for the wound on his left lateral foot.Marland Kitchen He is completed IV antibiotics and 2 rounds of hyperbaric oxygen for treatment of underlying osteomyelitis. He arrives today with a surface on most of the  wound area however our intake nurse noted drainage from the superior aspect. This was brought to my attention. 12/13/2017 -- the right plantar foot had a large bleb and once the bleb was opened out a large callus and subcutaneous debris was removed and he has a plantar ulcer near the fourth metatarsal head 12/28/17 on evaluation today patient appears to be doing acutely worse in regard to his left foot. The wound which has been appearing to do better as now open up more deeply there is bone palpable at the base of the wound unfortunately. He tells me that this feels "like it did when he had osteomyelitis previously" he also noted that his second toe on the left foot appears to be doing worse and is swollen there does appear to be some fluid collected underneath. His right foot plantar ulcer appears to be doing somewhat better at this point and there really is no complication at the site currently. No fevers, chills, nausea, or vomiting noted at this time. Patient states that he normally has no pain at this site however. Today he is having significant pain. 01/03/18; culture done last week showed methicillin sensitive staph aureus and group B strep. He was on Septra however he arrives today with a fever of 101. He has a functional dialysis shunt in his left arm but has had no pain here. No cough. He still makes some urine no dysuria he did have abdominal pain nausea and diarrhea over the weekend but is not had any diarrhea since yesterday. Otherwise he has no specific complaints 01/05/18; the patient returns today in follow-up for his presentation from 1/8. At that point he was febrile. I gave him some Levaquin adjusted for his dialysis status. He tells me the fever broke that night and it is not likely that this was actually a wound infection. He is gone on to have an MRI of the left foot; this showed interval development of an abnormal signal from the base of the third and fourth metatarsal and in the  cuboid reminiscent consistent with osteomyelitis. There is no mention of the left second toe I think which was a concern when it was ordered. The patient is taking his Levaquin 01/10/18; the patient has completed his antibiotics today. The area on the left lateral foot is smaller but it still probes easily to bone.  He has underlying osteomyelitis here and sees Dr. Megan Salon of infectious disease this week ooThe area on the right third plantar metatarsal head still requires debridement not much change in dimensions [facility] has a new wound on the medial tip of his right third toe again this probes to bone. He only noticed this 2 days ago 01/17/18; the patient saw Dr. [name] last week and he is back on Vanco and Fortaz at dialysis. 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 We are previously seeing him for a wound also on the base of the fourth med head on the right. X- ray of this area did not show osteomyelitis in relation to a new wound on the tip of his right third toe. Again this probes to bone. Finally his second toe on the left which didn't really show anything on the MRI at least no report appears to have a separated cutaneous area which I think is going to come right off the tip of his toe and leave exposed bone. Whether this is infectious or ischemic I am not clear Culture I did from the third toe last week which was his new wound was negative. Plain x-ray on the right foot did not show osteomyelitis in the toes 01/24/18; the patient is going went to see Dr. [name]. He is going to have a amputation of the left second and right third toe although paradoxically the left second toe looks better than last week. We have treating a deep probing wound on the left lateral foot and the area over the third metatarsal head on the right foot. 2/5/19the patient had amputation of the left second  and right third toes. Also a debridement including bone of the left lateral foot and a closure which is still sutured. This is a bit surprising. Also apparent debridement of the base of the right third toe wound. 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 I'm not sure exactly where we are in the frame of things here. The wound on the left lateral foot is still sutured also the amputation sites of the left second and right third toe. He is been using Xeroform to the sutured areas silver alginate to the right foot 02/14/18; he continues on Rocephin at dialysis for osteomyelitis. I still don't have a good sense of where we are in the treatment duration. The wounds on the left lateral foot had the sutures removed and this clearly still probes deeply. We debrided the area with a #5 curet. We'll use silver alginate to the wound oothe area over the right third met head also required debridement of callus skin and subcutaneous tissue and necrotic debris over the wound surface. This tunnels superiorly but I did not unroofed this today. ooThe areas on the tip of his toes have a crusted surface eschar I did not debridement this either. 02/21/18; he continues on Rocephin at dialysis for osteomyelitis. I don't have a good sense of where we are and the treatment duration. The wounds on the left lateral foot is callused over on presentation but requires debridement. The area on the right third med head plantar aspect actually is measuring smaller 02/28/18;patient is on  Vanco and Fortaz at dialysis, I'm not sure why I thought this was Rocephin above unless it is been changed. I don't have a good sense of time frame here. Using silver collagen to the area on the left lateral foot and right third med head plantar  foot 03/07/18; patient is completing bank and Fortaz at dialysis soon. He is using Silver collagen to the area on the left lateral foot and the right third metatarsal head. He has a smaller superficial wound distal to the left lateral foot wound. 03/14/18-he is here in follow-up evaluation for multiple ulcerations to his bilateral feet. He presents with a new ulceration to the plantar aspect of the left foot underneath the blister, this was deroofed to reveal a partial thickness ulcer. He is voicing no complaints or concerns, tolerated dialysis yesterday. We will continue with same treatment plan and he will follow-up next week 03/21/18; the patient has a area on the lateral left foot which still has a small probing area. The overall surface area of the wound is better. He presented with a new ulceration on the left foot plantar fifth metatarsal head last week. The area on the right third metatarsal head appears smaller.using silver alginate to all wound areas. The patient is changing the dressing himself. He is using a Darco forefoot offloading are on the right and a healing sandal on the left 03/28/18; original wound left lateral foot. Still nowhere close to looking like a heeling surface oosecondary wound on the left lateral foot at the base of his question fifth metatarsal which was new last week ooRight third metatarsal head. ooWe have been using silver alginate all wounds 04/04/18; the original wound on the left lateral foot again heavy callus surrounding thick nonviable subcutaneous tissue all requiring debridement. The new wound from 2 weeks ago just near this at the base of the fifth metatarsal head still looks about the same ooUnfortunately there is been deterioration in the third medical head wound on the right which now probes to bone. I must say this was a superficial wound at one point in time and I really don't have a good frame of reference year. I'll have to go back to look  through records about what we know about the right foot. 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 He is also been to see Dr. [name]. Somewhere in here as somebody is ordered a VASCULAR evaluation which was done on 03/22/18. On the right is anterior tibial artery was monophasic triphasic at the posterior tibial artery. On the left anterior tibial artery monophasic posterior tibial artery monophasic. ABI in the right was 1.43 on the left 1.21. TBIs on the right at 0.41 and on the left at 0.32. A vascular consult was recommended and I think has been arranged. 04/11/18; Mr. Varnell has not had an MRI of the right foot since 2017. Recent x-ray of the right foot done in January and February was negative. However he has had a major deterioration in the wound over the third met head. He is completed antibiotics last week at dialysis [place]. I think he is going to see vascular in follow-up. The areas on the left lateral foot and left plantar fifth metatarsal head both look satisfactory. I debrided both of these areas although the tissue here looks good. The area on the left lateral foot once probe to bone it certainly does not do that  now 04/18/18; MRI of the right foot is on Thursday. He has what looks to be serosanguineous purulent drainage coming out of the wound over the right third metatarsal head today. He completed antibiotics bank and Fortaz 2 weeks ago at dialysis. He has a vascular follow-up with regards to his arterial insufficiency although I don't exactly see when that is booked. The areas on the left foot including the lateral left foot and the plantar left fifth metatarsal head look about the same. 04/25/18-He is here in follow-up evaluation for bilateral foot ulcers. MRI obtained was negative for osteomyelitis. Wound culture was negative. We will continue with same treatment plan he'll  follow-up next week 05/02/18; the right plantar foot wound over the third metatarsal head actually looks better than when I last saw this. His MRI was negative for osteomyelitis wound culture was negative. ooOn the left plantar foot both wounds on the plantar fifth metatarsal head and on the lateral foot both are covered in a very hard circumferential callus. 05/09/18; right plantar foot wound over the third metatarsal head stable from last week. ooLeft plantar foot wound over the fifth metatarsal head also stable but with callus around both wound areas ooThe area on the left lateral foot had thick callus over a surface and I had some thoughts about leaving this intact however it felt boggy. ooWe've been using silver alginate all wounds 05/16/18; since the patient was last here he was hospitalized from 5/15 through 5/19. 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 This grew MRSA. He was placed on vancomycin.MRI of the left foot suggested "progressive" osteomyelitis and bone destruction of the cuboid and the base of the fifth metatarsal. Stable erosive changes of the base of the second metatarsal. I'm wondering if they're aware that he had surgery and debridement in the area of the underlying bone previously by Dr. [name]. In any case, He was also revascularized with an angioplasty and stenting of the left tibial peroneal trunk and angioplasty of the left posterior tibial artery. He is on vancomycin at dialysis. He has a 2 week follow-up with infectious disease. He has vascular surgery follow-up. He was started on Plavix. 05/23/18; the patient's wound on the lateral left foot at the level of the fifth metatarsal head and the plantar wound on the plantar fifth metatarsal head both look better. The area on the right third metatarsal head still has depth and undermining. We've been using silver  alginate. He is on vancomycin. He has been revascularized on the left 05/30/18; he continues on vancomycin at dialysis. Revascularized on the left. The area on the left lateral foot is just about closed. Unfortunately the area over the plantar fifth metatarsal head undermining superiorly as does the area over the right third metatarsal head. Both of these significantly deteriorated from last week. He has appointments next week with infectious disease and orthopedics I delayed putting on a cast on the left until those appointments which therefore we made we'll bring him in on Friday the 14th with the idea of a cast on the left foot 06/06/18; he continues on vancomycin at dialysis. Revascularized on the left. He arrives today with a ballotable swelling just above the area on the left lateral foot where his previous wound was. He has no pain but he is reasonably insensate. The difficult areas on the plantar left fifth metatarsal head looks stable whereas the  area on the right third plantar metatarsal head about the same as last week there is undermining here although I did not unroofed this today. He required a considerable debridement of the swelling on the left lateral foot area and I unroofed an abscess with a copious amount of brown purulent material which I obtained for culture. I don't believe during his recent hospitalization he had any further imaging although I need to review this. Previous cultures from this area done in this clinic showed MRSA, I would be surprised if this is not what this is currently even though he is on vancomycin. 06/13/18; he continues on vancomycin at dialysis however he finishes this on Sunday and then graduates the doxycycline previously prescribed by Dr. [name]. The abscess site on the lateral foot that I unroofed last time grew moderate amounts of methicillin-resistant staph aureus that is both vancomycin and tetracycline sensitive so we should be okay from that  regard. He arrives today in clinic with a connection between the abscess site and the area on the lateral foot. We've been using silver alginate to all his wound areas. The right third metatarsal head wound is measuring smaller. Using silver alginate on both wound areas 06/20/18; transitioning to doxycycline prescribed by Dr. [name]. The abscess site on the lateral foot that I unroofed 2 weeks ago grew MRSA. That area has largely closed down although the lateral part of his wound on the fifth metatarsal head still probes to bone. I suspect all of this was connected. ooOn the right third metatarsal head smaller looking wound but surrounded by nonviable tissue that once again requires debridement using silver alginate on both areas 06/27/18; on doxycycline 100 twice a day. The abscess on the lateral foot has closed down. Although he still has the wound on the left fifth metatarsal head that extends towards the lateral part of the foot. The most lateral part of this wound probes to bone ooRight third metatarsal head still a deep probing wound with undermining. Nevertheless I elected not to debridement this this week ooIf everything is copious static next week on the left I'm going to attempt a total contact cast 07/04/18; he is tolerating his doxycycline. The abscess on the left lateral foot is closed down although there is still a deep wound here that probes to bone. We will use silver collagen under the total contact cast Silver collagen to the deep wound over the right third metatarsal head is well 07/11/18; he is tolerating doxycycline. The left lateral fifth plantar metatarsal head still probes deeply but I could not probe any bone. We've been using silver collagen and this will be the second week under the total contact cast ooSilver collagen to deep wound over the right third metatarsal head as well 07/18/18; he is still taking doxycycline. The left lateral fifth plantar metatarsal this week  probes deeply with a large amount of exposed bone. Quite a deterioration. He required extensive debridement. Specimens of bone for pathology and CNS obtained ooAlso considerable debridement on the right third metatarsal head 07/25/18; bone for pathology last week from the lateral left foot showed osteomyelitis. CandS of the bone Enterobacter. And Klebsiella. He is now on ciprofloxacin and the doxycycline is stopped [Dr. [name] of infectious disease] area He has an appointment with Dr. [name] tomorrow I'm going to leave the total contact cast off for this week. May wish to try to reapply that next week or the week after depending on the wound bed looks. I'm not sure if  there is an operative option here, previously is followed with Dr. [name] 08/01/18 on evaluation today patient presents for reevaluation. He has been seen by infectious disease, Dr. [name], and he has placed the patient back on Ceftaz currently to be given to him at dialysis three times a week. 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 Fortunately there does not appear to be the evidence of infection at this point in time. Overall the patient has been tolerating the dressing changes without complication. We are no longer due lives in the cast of left foot secondary to the infection obviously. 08/10/18 on evaluation today patient appears to be doing rather well all things considered in regard to his left plantar foot ulcer. He does have a significant callous on the lateral portion of his left foot he wonders if I can help clean that away to some degree today. Fortunately he is having no evidence of infection at this time which is good news. No fevers chills noted. With that being said his right plantar foot actually does have a significant callous buildup around the wound opening this seems to not be doing as well as I would like at this point. I do believe that  he would benefit from sharp debridement at this site. Subsequently I think a total contact cast would be helpful for the right foot as well. 08/17/18; the patient arrived today with a total contact cast on the right foot. This actually looks quite good. He had gone to see Dr. Megan Salon of infectious disease about the osteomyelitis on the left foot. I think he is on IV Fortaz at dialysis although I am not exactly sure of the rationale for the Fortaz at the time of this dictation. He also arrived in today with a swelling on the lateral left foot this is the site of his original wounds in fact when I first saw this man when he was under Dr. Ardeen Garland care I think this was the site of where the wound was located. It was not particularly tender however using a small scalpel I opened it to [place] some moderate amount of purulent drainage. 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 Last MRI was in May that showed progressive osteomyelitis and bone destruction of the cuboid and base of the fifth metatarsal. Last week he had an abscess over this same area this was removed. Culture was negative. ooWe are continuing with a total contact cast to the area on the third metatarsal head on the right and making some good progress here. The patient asked me again about renal transplant. He is not on the list because of the open wounds. 08/31/18; apparently the patient had an interruption in the [place] but he is now back on this at dialysis. Apparently there was a misunderstanding and Dr. Crissie Figures orders. Due to have the MRI of the left foot tonight. ooThe area on the right foot continues to have callus thick skin and subcutaneous tissue around the wound edge that requires constant debridement however the wound is smaller we are using a total contact cast in this area. Alginate all  wounds 09/07/18; without much surprise the MRI of his left foot showed osteomyelitis in the reminiscent of his fourth metatarsal but also the third metatarsal. She arrives in clinic today with the right  foot and a total contact cast. There was purulent drainage coming out of the wound which I have cultured. Marked deterioration here with undermining widely around the wound orifice. Using pickups and a scalpel I remove callus and subcutaneous tissue from a substantial new opening. ooIn a similar fashion the area on the left lateral foot that was blistered last week I have opened this and remove skin and subcutaneous tissue from this area to expose a obvious new wound. I think there is extension and communication between all of this on the left foot. ooThe patient is on Fortaz at dialysis. Culture done of the right foot. We are clearly not making progress here. ooWe have made him an appointment with Dr. [name] of orthopedics. I think an amputation of the left leg may be discussed. I don't think there is anything that can be done with foot salvage. 09/14/18; culture from the right foot last time showed a very resistant MRSA. This is resistant to doxycycline. I'm going to try to get linezolid at least for a week. He does not have a appointment with Dr. [name] yet. 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 I'll send a message to Dr. [name] of infectious disease. I want the patient to see Dr. [name] to go over the pros and cons of an amputation which I think will be a BKA. The patient had a question about whether this is curative or not. I told him that I thought it would be although spread of staph aureus infection is not unheard of. MRI of the right foot was done at the end of April 2019 did not show osteomyelitis. The patient last saw Dr. [name] on 9/3. I'll send Dr. [name] a message. I would really like him to weigh the pros and cons  of a BKA on the left otherwise he'll probably need IV antibiotics and perhaps hyperbaric oxygen again. He has not had a good response to this in the past. His MRI earlier this month showed progressive damage in the remnants of the fourth and third metatarsal 09/21/2018; sees Dr. [name] of orthopedics next week to discuss a left BKA in response to the osteomyelitis in the left foot. Using silver alginate to all wound areas. He is completing the Zyvox I did put in for him after last culture showed MRSA 09/29/2018; patient saw Dr. [name] of orthopedics discussed the osteomyelitis in the left foot third and fourth metatarsals. He did not recommend urgent surgery but certainly stated the only surgical option would be a BKA. He is communicating with Dr. [name]. The problem here is the instability of the areas on his foot which constantly generate draining abscesses. I will communicate with Dr. [name] about this. He has an appointment on 10/23 10/05/2018; patient sees Dr. [name] on 10/23. I have sent him a secure message not ordered any additional antibiotics for now. Patient continues to have 2 open areas on the left plantar foot at the fifth metatarsal head and the lateral aspect of the foot. These have not changed all that much. The area on the right third met head still has thick callus and surrounding subcutaneous tissue we have been using silver alginate 10/12/2018; patient sees Dr. [name] or colleague on 10/23. 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 The area on the right plantar foot is just about closed. The superior wound on the left fifth  metatarsal head is callused over but I am not sure if this is closed. The area below it is about the same. Considerable amount of callus on the lateral foot. We have been using silver alginate to the wounds 10/19/2018; the patient saw Dr. [name] on 10/22. He wishes to try and continue to  save the left foot. He has been given 6 weeks of oral ciprofloxacin. We continue to put a total contact cast on the right foot. The area over the fifth metatarsal head on the left is callused over/perhaps healed but I did not remove the callus to find out. He still has the wound on the left lateral midfoot requiring debridement. We have been using silver alginate to all wound areas 10/26/2018 ooOn the left foot our intake nurse noted some purulent drainage from the inferior wound [currently the only one that is not callused over] ooOn the right foot even though he is in total contact cast a considerable amount of thick black callus and surface eschar. On this side we have been using silver alginate under a total contact cast. oohe remains on ciprofloxacin as prescribed by Dr. [name] 11/02/2018; culture from last week which is done of the probing area on the left midfoot wound showed MRSA "few". I am going to need to contact Dr. [name] which I will do today I think he is going to need IV antibiotics again. The area on the left foot which was callused on the side is clearly separating today all of this was removed a copious amounts of callus and necrotic subcutaneous tissue. ooThe area on the right plantar foot actually remained healthy looking with a healthy granulated base. ooWe are using silver alginate to all wound areas 11/09/2018; unfortunately neither 1 of the patient's wounds areas looks at all satisfactory. On the left he has considerable necrotic debris from the plantar wound laterally over the foot. I removed copious amounts of material including callus skin and subcutaneous tissue. On the right foot he arrives with undermining and frankly purulent drainage. Specimen obtained for culture and debridement of the callused skin and subcutaneous tissue from around the circumference. Because of this I cannot put him back in a total contact cast but to be truthful we are  really unfortunately not making a lot of progress I did put in a secure text message to Dr. [name] wondering about the MRSA on the left foot. He suggested vancomycin although this is not started. I was left wondering if he expected me to send this into dialysis. Has we have more purulence on the right side wait for that result before calling dialysis 11/16/2018; I called dialysis earlier this week to get the vancomycin started. I think he got the first dose on Tuesday. The patient tells me that he fell earlier this week twisting his left foot and ankle and he is swelling. He continues to not look well. He had blood cultures done at dialysis on Tuesday he is not been informed of the results. 12/07/2018; the patient was admitted to hospital from 11/22/2018 through 12/02/2018. He underwent a left BKA. Apparently had staph sepsis. In the meantime being off the right foot this is closed over. He follows up with Dr. 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 Subsequent to this he actually had multiple falls in fact he tells me to following which calls the wound to be his apparently. He has been  seeing Dr. [name] and his physician assistant in the interim. They actually did want him to come back for reevaluation to see if there's anything we can do for me wound care perspective the help this area to heal more appropriately. The patient states that he does not have a tremendous amount of pain which is good news. No fevers, chills, nausea, or vomiting noted at this time. We have gotten approval from Dr. [name] office had [employer] for [place] to treat the patient for his stop wound. This is due to the fact that the patient was in the 90 day postop global. 1/21; the patient does not have an open wound on the right foot although he does have some pressure areas that will need to be padded  when he is transferring. The dehisced surgical wound looks clean although there is some undermining of 1.5 cm superiorly. 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 This appears healthy but there is undermining. TheraSkin #1 2/11; TheraSkin #2 2/25; TheraSkin #3. Wound is measuring smaller 3/10; TheraSkin #4 wound is measuring smaller 3/24; TheraSkin #5 wound is measuring smaller and looks healthy. 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 He has no open area on the right foot although he does have some callused area over the original third metatarsal head wound. The third metatarsal is also subluxed on the right foot. I have warned him today that he cannot consider wearing a prosthesis for at least a month but he can go for measurements. He is going to need to keep the right foot padded is much as possible in his diabetic shoe indefinitely READMISSION 06/12/2019 Mr. Pair is a type II diabetic on dialysis. He has been in this clinic multiple times with wounds on his bilateral lower extremities. 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 He also has a history of wounds on his right foot but tells me that everything is going well here. He actually came in here with his prosthesis walking with a cane. We were all really quite gratified to see this He states over the last several weeks he has had a painful area at the tip of the left third finger. His dialysis shunt in his is in the left upper arm and this particularly hurts during dialysis when his fingers get numb and there is a lot of pain in the tip of his left third finger. I believe he is seen vascular surgery. He had a Doppler done to evaluate for a dialysis steal syndrome. He is going for a banding procedure 1 week from today towards the  left AV fistula. I think if that is unsuccessful they will be looking at creating a new shunt. He had a course of doxycycline when he which he finished about a week ago. He has been using Bactroban to the finger 6/25; the patient had his banding procedure and things seem to be going better. He is not having pain in his hand at dialysis. The area on the tip of his finger seems about closed. He did however have a paronychia I the last time I saw him. I have been advising him to use topical Bactroban washing the finger. Culture I did last time showed a few staph epidermidis which I was willing to  dismiss as superficial skin contaminant. He arrives today with the finger wound looking better however the paronychia a seems worse 7/9; 2-week follow-up. He does not have an open wound on the tip of his third left finger. I thought he had an initial ischemic wound on the tip of the finger as well as a paronychia a medially. All of this appears to be closed. 8/6-Patient returns after 3 weeks for follow-up and has a right second met head plantar callus with a significant area of maceration hiding an ulcer ABI repeated today on right - 1.26 8/13-Patient returns at 1 week, the right second met head plantar wound appears to be slightly better, it is surrounded by the callus area we are using silver alginate 8/20; the patient came back to clinic 2 weeks ago with a wound on the right second metatarsal head. He apparently told me he developed callus in this area but also went away from his custom shoes to a pair of running shoes. Using silver alginate. 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 There might be much we can do to offload this although the patient tells me he has a wheelchair that he is using to try and stay off the wound is much as possible 8/27; right second metatarsal head. Still small punched out area with thick callus and subcutaneous tissue around the wound. We have been  using silver collagen. This is always been an issue with this man's wound especially on this foot 9/3; right second met head. Still the same punched-out area with thick callus and subcutaneous tissue around the wound removing the circumference demonstrates repetitively undermining area. I have removed all the subcutaneous tissue associated with this. We have been using silver collagen 9/15; right second metatarsal head. Same punched-out thick callus and subcutaneous tissue around the wound area. Still requiring debridement we have been using silver collagen I changed back to silver alginate X-ray last time showed no evidence of osteomyelitis. Culture showed Klebsiella and he was started and Keflex apparently just 4 days ago 9/24; right second metatarsal head. He is completed the antibiotics I gave him for 10 days. Same punched-out thick callus and subcutaneous tissue around the area. Still requiring debridement. We have been using silver alginate. The area itself looks swollen and this could be just [place] that comes on this area with walking on a prosthesis on the left however I think he needs an MRI and I am going to order that today. There is not an option to offload this further 10/1; right second plantar metatarsal head. MRI booked for October 6. Generally looking better today using silver alginate 10/8; right second plantar metatarsal head. MRI that was done on 10/6 was negative for osteomyelitis noted prior amputations of the second and fourth toes at the [facility]. Prior amputation of the third toe to the base of the middle phalanx. Prior amputation of the fifth toe to the head of the metatarsal. 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 He does not recall about hearing about this. He did not have any pain but then again he has reduced sensation from diabetic neuropathy 10/15; right second plantar metatarsal head. Still in the same condition  callus nonviable subcutaneous tissue which cleans up nicely with debridement but reforms by the next week. The patient tells me that he is offloading this is much as he can including taking his wheelchair to dialysis. We have been using  silver alginate, changed to silver collagen today 10/22; right second plantar metatarsal head. Wound measures smaller but still thick callus around this wound. Still requiring debridement 11/5 right second plantar metatarsal head. Less callus around the wound circumference but still requiring debridement. There is still some undermining which I cleaned out the wound looks healthy but still considerable punched out depth with a relatively small circumference to the wound there is no palpable bone no purulent drainage 11/12; right second plantar metatarsal head. Small wound with thick callus tissue around the wound and significant undermining. We have been using silver collagen after my continuous debridements of this area 11/19; patient arrives in today with purulent drainage coming out of the wound in the second third met head area on the right foot. Serosanguineous thick drainage. Specimen obtained for culture. 11/21/2019 on evaluation today patient appears to be doing well with regard to his foot ulcer. The good news is is culture came back negative for any bacteria there was no growth. The other good news is his x-ray also appears to be doing well. The foot is also measuring much better than what it was. Overall I am very pleased with how things seem to be progressing. 12/22; patient was in [facility] for several weeks due to the death of a family member. He said he stayed off the wound using silver alginate. 01/03/2020. Patient has a small open area with roughly 0.3 cm of circumferential undermining. The orifice looks smaller. We have been using silver collagen. There is not a wound more aggressive way to offload this area as he has a prosthesis on the  other leg 1/21; again the same small area is 2 weeks ago. We have been using silver collagen I change that to endoform today I really believe that this is an offloading issue 2/4; the area on the right third met head. We have been using endoform. 2/11 the area on the right third met head we have been using silver alginate. 2/25; the area on the right third met head. There is much less callus on this today. The patient states he is offloading this even more aggressively. As an example he is taking his wheelchair into dialysis on most days 3/4; right third metatarsal head. Again he has eschar over the surface of the wound which I removed with a #3 curette. Some subcutaneous debris. This has 0.8 cm of direct probing depth. I cannot feel any bone here. I did do a culture the area 3/11; right third metatarsal head. Again an eschared area over the wound which almost makes this look superficial. Again debridement reveals a wound with probing depth probably not much different from last week there is no palpable bone no palpable purulence. The patient tells me that he is making every effort to offload this area properly. He is taking wheelchair into dialysis etc. There is no option for forefoot offloading or a total contact cast. We used Oasis #1 today 3/18; again callus and eschar over the wound surface. I remove this but still a probing wound although only 3 mm in depth this time. This is an improvement 3/25; again callus and eschar over the wound surface which I removed the wound is much deeper today at 0.7 cm. There is no palpable bone. Because of the appearance of this a culture was done. I am not going to put the Oasis back in this. Concerned about underlying infection. Notable for the fact that he is just finished doxycycline for the last go  round of this 3/30; still 0.7 cm in depth which is disappointing. Culture I gave last time showed a few Staph aureus which is methicillin susceptible. This  should have been taken care of by the recent course of doxycycline I gave him. We are using silver alginate 4/6; almost 1 cm in depth. We have been using silver alginate with underlying Bactroban. Objective Constitutional Vitals Time Taken: 2:31 PM, Height: 73 in, Source: Stated, Weight: 202 lbs, Source: Stated, BMI: 26.6, Temperature: 98.3 F, Pulse: 78 bpm, Respiratory Rate: 18 breaths/min, Blood Pressure: 111/65 mmHg, Capillary Blood Glucose: 145 mg/dl. General Notes: glucose per pt report Cardiovascular Pedal pulses are palpable on the right. General Notes: Wound exam; callused over callus skin debris removed with a #3 curette. There is a 1 cm probing depth of this this does not show to bone probably some degree of undermining but it is difficult to measure given the small surface area Integumentary (Hair, Skin) Wound #41 status is Open. Original cause of wound was Gradually Appeared. The wound is located on the Right Metatarsal head second. The wound measures 0.2cm length x 0.2cm width x 0.6cm depth; 0.031cm^2 area and 0.019cm^3 volume. There is Fat Layer (Subcutaneous Tissue) Exposed exposed. There is no tunneling noted, however, there is undermining starting at 12:00 and ending at 12:00 with a maximum distance of 0.4cm. There is a small amount of serosanguineous drainage noted. The wound margin is well defined and not attached to the wound base. There is large (67-100%) red granulation within the wound bed. There is no necrotic tissue within the wound bed. 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 There was a Excisional Skin/Subcutaneous Tissue Debridement with a total area of 0.04 sq cm performed by Ricard Dillon., MD. With the following instrument(s): Curette to remove Viable and Non-Viable tissue/material. Material removed includes Subcutaneous Tissue, Slough, Skin: Dermis, and Skin: Epidermis after achieving pain control using Lidocaine 5% topical ointment. No specimens were taken. A time out was conducted at 15:24, prior to the start of the procedure. A Minimum amount of bleeding was controlled with Pressure. The procedure was tolerated well with a pain level of 0 throughout and a pain level of 0 following the procedure. Post Debridement Measurements: 0.2cm length x 0.2cm width x 0.6cm depth; 0.019cm^3 volume. Character of Wound/Ulcer Post Debridement is improved. Severity of Tissue Post Debridement is: Fat layer exposed. Post procedure Diagnosis Wound #41: Same as Pre-Procedure Plan Follow-up Appointments: Return Appointment in 1 week. - to see Margarita Grizzle. Dressing Change Frequency: Wound #41 Right Metatarsal head second: Change dressing every day. Wound Cleansing: May shower and wash wound with soap and water. Primary Wound Dressing: Wound #41 Right Metatarsal head second: Calcium Alginate with Silver - apply thin layer of bactroban then pack calcium alginate into wound bed. Secondary Dressing: Wound #41 Right Metatarsal head second: Foam - foam donut Kerlix/Rolled Gauze Dry Gauze Other: - felt to periwound and place one below wound. lightly wrap coban to hold dressing in place. felt / pad wound Edema Control: Avoid standing for long periods of time Elevate legs to the level of the heart or above for  30 minutes daily and/or when sitting, a frequency of: - throughout the day. Off-Loading: Other: - felt to periwound and place one below wound. Additional Orders / Instructions: Other: - minimize walking and standing on foot to aid in  wound healing and callus buildup. #1 Bactroban with silver alginate. 2. I think he may require an MRI to look at the underlying bone. We will check and see his recent radiologic history with regards to the right foot Electronic Signature(s) Signed: 04/01/2020 6:13:52 PM By: Linton Ham MD Entered By: Linton Ham on 04/01/2020 16:04:45 -------------------------------------------------------------------------------- SuperBill Details Patient Name: Date of Service: [name] 04/01/2020 Medical Record [id number] Patient Account Number: [id number] Date of Birth/Sex: Treating RN: 23-Aug-1951 (68 y.o. 

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
## 2020-04-09 NOTE — Progress Notes (Signed)
[name], [name] ([id number]) Visit Report for 03/13/2020 Arrival Information Details Patient Name: Date of Service: [name], [name] 03/13/2020 12:45 PM Medical Record [id number] Patient Account Number: [id number] Date of Birth/Sex: Treating RN: 05/25/1951 (69 y.o. [name], Meta.Reding Primary Care Yousof Alderman: [name] Other Clinician: Referring Zalan Shidler: Treating Marsela Kuan/Extender:Robson, Jeronimo Greaves, Buena Irish in Treatment: 39 Visit Information History Since Last Visit Added or deleted any medications: No Patient Arrived: Wheel Chair Any new allergies or adverse reactions: No Arrival Time: 12:43 Had a fall or experienced change in No activities of daily living that may affect Accompanied By: self risk of falls: Transfer Assistance: None Signs or symptoms of abuse/neglect since last No Patient Identification Verified: Yes visito Secondary Verification Process Completed: Yes Hospitalized since last visit: No Patient Requires Transmission-Based No Implantable device outside of the clinic excluding No Precautions: cellular tissue based products placed in the center Patient Has Alerts: No since last visit: Has Dressing in Place as Prescribed: Yes Pain Present Now: No Electronic Signature(s) Signed: 04/09/2020 9:21:08 AM By: Sandre Kitty Entered By: Sandre Kitty on 03/13/2020 12:44:04 -------------------------------------------------------------------------------- Encounter Discharge Information Details Patient Name: Date of Service: [name] 03/13/2020 12:45 PM Medical Record [id number] Patient Account Number: [id number] Date of Birth/Sex: Treating RN: 1951/05/23 (69 y.o. [name] Primary Care Geanna Divirgilio: [name] Other Clinician: Referring Zohaib Heeney: Treating Keyler Hoge/Extender:Robson, Jeronimo Greaves, Buena Irish in Treatment: 39 Encounter Discharge Information Items Post Procedure Vitals Discharge Condition:  Stable Temperature (F): 98.5 Ambulatory Status: Cane Pulse (bpm): 74 Discharge Destination: Home Respiratory Rate (breaths/min): 18 Transportation: Private Auto Blood Pressure (mmHg): 107/53 Accompanied By: self Schedule Follow-up Appointment: Yes Clinical Summary of Care: Patient Declined Electronic Signature(s) Signed: 03/13/2020 4:45:01 PM By: Baruch Gouty RN, BSN Entered By: Baruch Gouty on 03/13/2020 14:27:19 -------------------------------------------------------------------------------- Lower Extremity Assessment Details Patient Name: Date of Service: [name], [name] 03/13/2020 12:45 PM Medical Record [id number] Patient Account Number: [id number] Date of Birth/Sex: Treating RN: Sep 07, 1951 (69 y.o. [name] Primary Care Byan Poplaski: [name] Other Clinician: Referring Annaya Bangert: Treating Bernabe Dorce/Extender:Robson, Jeronimo Greaves, Buena Irish in Treatment: 39 Edema Assessment Assessed: [Left: No] [Right: Yes] Edema: [Left: N] [Right: o] Calf Left: Right: Point of Measurement: cm From Medial Instep cm 32 cm Ankle Left: Right: Point of Measurement: cm From Medial Instep cm 21.5 cm Vascular Assessment Pulses: Dorsalis Pedis Palpable: [Right:Yes] Electronic Signature(s) Signed: 03/13/2020 4:55:35 PM By: Deon Pilling Entered By: Deon Pilling on 03/13/2020 12:57:23 -------------------------------------------------------------------------------- Multi Wound Chart Details Patient Name: Date of Service: [name], [name] 03/13/2020 12:45 PM Medical Record [id number] Patient Account Number: [id number] Date of Birth/Sex: Treating RN: 06-04-1951 (69 y.o. 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] Comorbid History: [41:Cataracts, Anemia, Arrhythmia, Congestive Heart Failure, Hypertension, Peripheral Arterial Disease, Type II Diabetes, End Stage Renal Disease, History of Burn, Osteoarthritis, Osteomyelitis, Neuropathy] [N/A:N/A] Date Acquired: [41:07/23/2019] [N/A:N/A] Weeks of Treatment: [41:32] [N/A:N/A] Wound Status: [41:Open] [N/A:N/A] Measurements L x W x D 0.1x0.1x0.1 [N/A:N/A] (cm) Area (cm) : [41:0.008] [N/A:N/A] Volume (cm) : [41:0.001] [N/A:N/A] % Reduction in Area: [41:99.50%] [N/A:N/A] % Reduction in Volume: 99.80% [N/A:N/A] Classification: [41:Grade 2] [N/A:N/A] Exudate Amount: [41:None Present] [N/A:N/A] Wound Margin: [41:Well  defined, not attached N/A] Granulation Amount: [41:None Present (0%)] [N/A:N/A] Necrotic Amount: [41:None Present (0%)] [N/A:N/A] Exposed Structures: [41:Fat Layer (Subcutaneous N/A Tissue) Exposed: Yes Fascia: No Tendon: No Muscle: No Joint: No Bone: No] Epithelialization: [41:Small (1-33%)] [N/A:N/A] Debridement: [41:Debridement - Excisional N/A] Pre-procedure [41:13:00] [N/A:N/A] Verification/Time Out Taken: Pain Control: [41:Lidocaine 4% Topical Solution] [N/A:N/A] Tissue Debrided: [41:Callus, Subcutaneous] [N/A:N/A] Level: [41:Skin/Subcutaneous Tissue N/A] Debridement Area (sq cm):4 [N/A:N/A] Instrument: [41:Curette] [N/A:N/A] Bleeding: [41:None] [N/A:N/A] Hemostasis Achieved: [41:Pressure] [N/A:N/A] Procedural Pain: [41:0] [N/A:N/A] Post Procedural Pain: [41:0] [N/A:N/A] Debridement Treatment [41:Procedure was tolerated] [N/A:N/A] Response: [41:well] Post Debridement [41:0.3x0.3x0.3] [N/A:N/A] Measurements L x W x  D (cm) Post Debridement [41:0.021] [N/A:N/A] Volume: (cm) Assessment Notes: [41:callus noted.] [N/A:N/A] Procedures Performed: [41:Cellular or Tissue Based Product Debridement] [N/A:N/A] Treatment Notes Electronic Signature(s) Signed: 03/13/2020 4:55:35 PM By: Deon Pilling Signed: 03/13/2020 7:02:26 PM By: Linton Ham MD Entered By: Linton Ham on 03/13/2020 13:11:20 -------------------------------------------------------------------------------- Multi-Disciplinary Care Plan Details Patient Name: Date of Service: CLEMENTS, TORO 03/13/2020 12:45 PM Medical Record [id number] Patient Account Number: [id number] Date of Birth/Sex: Treating RN: 08-01-1951 (69 y.o. [name] Primary Care Johnae Friley: [name] Other Clinician: Referring Elany Felix: Treating Sanye Ledesma/Extender:Robson, Jeronimo Greaves, Buena Irish in Treatment: 39 Active Inactive Wound/Skin Impairment Nursing Diagnoses: Knowledge deficit related to ulceration/compromised skin integrity Goals: Patient/caregiver will verbalize understanding of skin care regimen Date Initiated: 06/12/2019 Target Resolution Date: 04/25/2020 Goal Status: Active Ulcer/skin breakdown will have a volume reduction of 30% by week 4 Date Initiated: 06/12/2019 Date Inactivated: 07/05/2019 Target Resolution Date: 07/13/2019 Goal Status: Met Interventions: Assess patient/caregiver ability to obtain necessary supplies Assess patient/caregiver ability to perform ulcer/skin care regimen upon admission and as needed Assess ulceration(s) every visit Notes: Electronic Signature(s) Signed: 03/13/2020 4:55:35 PM By: Deon Pilling Entered By: Deon Pilling on 03/13/2020 12:46:07 -------------------------------------------------------------------------------- Pain Assessment Details Patient Name: Date of Service: KHAYREE, DELELLIS 03/13/2020 12:45 PM Medical Record [id number] Patient Account Number: [id number] Date of  Birth/Sex: Treating RN: [birth year] (69 y.o. [name] Primary Care Eliah Ozawa: [name] Other Clinician: Referring Lugene Hitt: Treating Hasan Douse/Extender:Robson, Jeronimo Greaves, Buena Irish in Treatment: 39 Active Problems Location of Pain Severity and Description of Pain Patient Has Paino No Site Locations Pain Management and Medication Current Pain Management: Electronic Signature(s) Signed: 03/13/2020 4:55:35 PM By: Deon Pilling Signed: 04/09/2020 9:21:08 AM By: Sandre Kitty Entered By: Sandre Kitty on 03/13/2020 12:46:26 -------------------------------------------------------------------------------- Patient/Caregiver Education Details Patient Name: Date of Service: [name] 3/18/2021andnbsp12:45 PM Medical Record Patient Account Number: [id number] Number: Treating RN: Deon Pilling 03-25-1951 (68 y.o. Other Clinician: Date of Birth/Gender: M) Treating Linton Ham Primary Care Physician: [name] Physician/Extender: Referring Physician: Sherlynn Stalls in Treatment: 39 Education Assessment Education Provided To: Patient Education Topics Provided Wound/Skin Impairment: Handouts: Skin Care Do's and Dont's Methods: Explain/Verbal Responses: Reinforcements needed Electronic Signature(s) Signed: 03/13/2020 4:55:35 PM By: Deon Pilling Entered By: Deon Pilling on 03/13/2020 12:46:40 -------------------------------------------------------------------------------- Wound Assessment Details Patient Name: Date of Service: SANFORD, LINDBLAD 03/13/2020 12:45 PM Medical Record [id number] Patient Account Number: [id number] Date of Birth/Sex: Treating RN: May 18, 1951 (69 y.o. [name] Primary Care Carden Teel: [name] Other Clinician: Referring Aaisha Sliter: Treating Isaac Dubie/Extender:Robson, Jeronimo Greaves, Buena Irish in Treatment: 39 Wound Status Wound Number: 41 Primary Diabetic Wound/Ulcer of the Lower  Extremity Etiology: Wound Location: Right Metatarsal head second Wound Open Wounding Event: Gradually Appeared Status: Date Acquired: 07/23/2019 Comorbid Cataracts, Anemia, Arrhythmia, Congestive Weeks Of Treatment: 32 History: Heart Failure, Hypertension, Peripheral Arterial Clustered Wound: No Disease, Type II Diabetes, End Stage  Renal Disease, History of Burn, Osteoarthritis, Osteomyelitis, Neuropathy Photos Photo Uploaded By: Mikeal Hawthorne on 03/17/2020 14:30:14 Wound Measurements Length: (cm) 0.1 % Reduction Width: (cm) 0.1 % Reduction Depth: (cm) 0.1 Epitheliali Area: (cm) 0.008 Tunneling: Volume: (cm) 0.001 Underminin Wound Description Classification: Grade 2 Wound Margin: Well defined, not attached Exudate Amount: None Present Wound Bed Granulation Amount: None Present (0%) Necrotic Amount: None Present (0%) Foul Odor After Cleansing: No Slough/Fibrino No Exposed Structure Fascia Exposed: No Fat Layer (Subcutaneous Tissue) Exposed: Yes Tendon Exposed: No Muscle Exposed: No Joint Exposed: No Bone Exposed: No in Area: 99.5% in Volume: 99.8% zation: Small (1-33%) No g: No Assessment Notes callus noted. Electronic Signature(s) Signed: 03/13/2020 4:55:35 PM By: Deon Pilling Entered By: Deon Pilling on 03/13/2020 12:57:49 -------------------------------------------------------------------------------- Vitals Details Patient Name: Date of Service: KEVONTAY, BURKS 03/13/2020 12:45 PM Medical Record [id number] Patient Account Number: [id number] Date of Birth/Sex: Treating RN: 03/19/1951 (69 y.o. [name], [name] Primary Care Nyshawn Gowdy: [name] Other Clinician: Referring Taivon Haroon: Treating Kayin Kettering/Extender:Robson, Jeronimo Greaves, Buena Irish in Treatment: 39 Vital Signs Time Taken: 12:44 Temperature (F): 98.5 Height (in): 73 Pulse (bpm): 74 Weight (lbs): 202 Respiratory Rate (breaths/min): 18 Weight (lbs): 202 Respiratory Rate  (breaths/min): 18 Body Mass Index (BMI): 26.6 Blood Pressure (mmHg): 107/53 Capillary Blood Glucose (mg/dl): 111 Reference Range: 80 - 120 mg / dl Electronic Signature(s) Signed: 04/09/2020 9:21:08 AM By: Sandre Kitty Entered By: Sandre Kitty on 03/13/2020 12:46:15

## 2020-04-09 NOTE — Progress Notes (Signed)
[name], [name] ([id number]) Visit Report for 03/20/2020 Arrival Information Details Patient Name: Date of Service: [name], [name] 03/20/2020 1:00 PM Medical Record [id number] Patient Account Number: [id number] Date of Birth/Sex: Treating RN: 01-08-1951 (69 y.o. M) Primary Care Vinita Prentiss: [name] Other Clinician: Referring Iviana Blasingame: Treating Augustina Braddock/Extender:Robson, Jeronimo Greaves, Buena Irish in Treatment: 61 Visit Information History Since Last Visit Added or deleted any medications: No Patient Arrived: Wheel Chair Any new allergies or adverse reactions: No Arrival Time: 13:20 Had a fall or experienced change in No activities of daily living that may affect Accompanied By: self risk of falls: Transfer Assistance: None Signs or symptoms of abuse/neglect since last No Patient Identification Verified: Yes visito Secondary Verification Process Completed: Yes Hospitalized since last visit: No Patient Requires Transmission-Based No Implantable device outside of the clinic excluding No Precautions: cellular tissue based products placed in the center Patient Has Alerts: No since last visit: Has Dressing in Place as Prescribed: Yes Pain Present Now: No Electronic Signature(s) Signed: 04/09/2020 9:21:08 AM By: Sandre Kitty Entered By: Sandre Kitty on 03/20/2020 13:21:27 -------------------------------------------------------------------------------- Encounter Discharge Information Details Patient Name: Date of Service: [name], [name] 03/20/2020 1:00 PM Medical Record [id number] Patient Account Number: [id number] Date of Birth/Sex: Treating RN: 1951/10/17 (69 y.o. [name] Primary Care Brogan Martis: [name] Other Clinician: Referring Perel Hauschild: Treating Dallie Patton/Extender:Robson, Jeronimo Greaves, Buena Irish in Treatment: 38 Encounter Discharge Information Items Post Procedure Vitals Discharge Condition: Stable Temperature (F):  98.3 Ambulatory Status: Wheelchair Pulse (bpm): 74 Discharge Destination: Home Respiratory Rate (breaths/min): 18 Transportation: Other Blood Pressure (mmHg): 95/49 Accompanied By: self Schedule Follow-up Appointment: Yes Clinical Summary of Care: Patient Declined Notes SCAT Electronic Signature(s) Signed: 03/20/2020 5:37:17 PM By: Baruch Gouty RN, BSN Entered By: Baruch Gouty on 03/20/2020 14:12:55 -------------------------------------------------------------------------------- Lower Extremity Assessment Details Patient Name: Date of Service: [name], [name] 03/20/2020 1:00 PM Medical Record [id number] Patient Account Number: [id number] Date of Birth/Sex: Treating RN: 04-05-1951 (69 y.o. [name] Primary Care Marnee Sherrard: [name] Other Clinician: Referring [place]ton Mielke: Treating Jisele Goller/Extender:Robson, Jeronimo Greaves, Buena Irish in Treatment: 40 Edema Assessment Assessed: [Left: No] [Right: No] Edema: [Left: N] [Right: o] Calf Left: Right: Point of Measurement: cm From Medial Instep cm 32 cm Ankle Left: Right: Point of Measurement: cm From Medial Instep cm 21.5 cm Vascular Assessment Pulses: Dorsalis Pedis Palpable: [Right:No] Electronic Signature(s) Signed: 03/20/2020 5:37:17 PM By: Baruch Gouty RN, BSN Entered By: Baruch Gouty on 03/20/2020 13:31:47 -------------------------------------------------------------------------------- Multi Wound Chart Details Patient Name: Date of Service: [name] 03/20/2020 1:00 PM Medical Record [id number] Patient Account Number: [id number] Date of Birth/Sex: Treating RN: Jul 18, 1951 (69 y.o. 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] Comorbid History: [41:Cataracts, Anemia, Arrhythmia, Congestive Heart Failure, Hypertension, Peripheral Arterial Disease, Type II Diabetes, End Stage Renal Disease, History of Burn, Osteoarthritis, Osteomyelitis, Neuropathy] [N/A:N/A] Date Acquired: [41:07/23/2019] [N/A:N/A] Weeks of Treatment: [41:33] [N/A:N/A] Wound Status: [41:Open] [N/A:N/A] Measurements L x W x D 0.5x0.4x1 [N/A:N/A] (cm) Area (cm) : [41:0.157] [N/A:N/A] Volume (cm) : [41:0.157] [N/A:N/A] % Reduction in Area: [41:91.10%] [N/A:N/A] % Reduction in Volume: 70.40% [N/A:N/A] Starting Position 1 12 (o'clock): Ending Position 1 [41:12] (o'clock): Maximum Distance 1 [  41:0.9] (cm): Undermining: [41:Yes] [N/A:N/A] Classification: [41:Grade 2] [N/A:N/A] Exudate Amount: [41:Small] [N/A:N/A] Exudate Type: [41:Serosanguineous] [N/A:N/A] Exudate Color: [41:red, brown] [N/A:N/A] Wound Margin: [41:Well defined, not attached N/A] Granulation Amount: [41:Large (67-100%)] [N/A:N/A] Granulation Quality: [41:Red] [N/A:N/A] Necrotic Amount: [41:None Present (0%)] [N/A:N/A] Exposed Structures: [41:Fat Layer (Subcutaneous N/A Tissue) Exposed: Yes Fascia: No Tendon: No Muscle: No Joint: No Bone: No] Epithelialization: [41:Small (1-33%)] [N/A:N/A] Debridement: [41:Debridement - Selective/Open Wound] [N/A:N/A] Pre-procedure [41:13:50] [N/A:N/A] Verification/Time Out Taken: Pain Control: [41:Lidocaine 4% Topical Solution] [N/A:N/A] Tissue Debrided: [41:Callus] [N/A:N/A] Level: [41:Non-Viable Tissue] [N/A:N/A] Debridement Area (sq cm):0.2 [N/A:N/A] Instrument: [41:Curette] [N/A:N/A] Specimen: [41:Swab] [N/A:N/A] Number of Specimens [41:1]  [N/A:N/A] Taken: Bleeding: [41:Moderate] [N/A:N/A] Hemostasis Achieved: [41:Pressure] [N/A:N/A] Procedural Pain: [41:0] [N/A:N/A] Post Procedural Pain: [41:0] [N/A:N/A] Debridement Treatment Procedure was tolerated [N/A:N/A] Response: [41:well] Post Debridement [41:0.5x0.4x1] [N/A:N/A] Measurements L x W x D (cm) Post Debridement [41:0.157] [N/A:N/A] Volume: (cm) Procedures Performed: Debridement [N/A:N/A] Treatment Notes Wound #41 (Right Metatarsal head second) 2. Periwound Care Skin Prep 3. Primary Dressing Applied Calcium Alginate Ag 4. Secondary Dressing Dry Gauze Foam 5. Secured With Tape Notes foam donut Electronic Signature(s) Signed: 03/20/2020 6:03:33 PM By: Linton Ham MD Entered By: Linton Ham on 03/20/2020 15:19:59 -------------------------------------------------------------------------------- Multi-Disciplinary Care Plan Details Patient Name: Date of Service: [name], [name] 03/20/2020 1:00 PM Medical Record [id number] Patient Account Number: [id number] Date of Birth/Sex: Treating RN: [birth year] (69 y.o. [name] Primary Care Latiqua Daloia: [name] Other Clinician: Referring Maybelline Kolarik: Treating Amarachi Kotz/Extender:Robson, Jeronimo Greaves, Buena Irish in Treatment: 40 Active Inactive Wound/Skin Impairment Nursing Diagnoses: Knowledge deficit related to ulceration/compromised skin integrity Goals: Patient/caregiver will verbalize understanding of skin care regimen Date Initiated: 06/12/2019 Target Resolution Date: 04/25/2020 Goal Status: Active Ulcer/skin breakdown will have a volume reduction of 30% by week 4 Date Initiated: 06/12/2019 Date Inactivated: 07/05/2019 Target Resolution Date: 07/13/2019 Goal Status: Met Interventions: Assess patient/caregiver ability to obtain necessary supplies Assess patient/caregiver ability to perform ulcer/skin care regimen upon admission and as needed Assess ulceration(s) every  visit Notes: Electronic Signature(s) Signed: 03/20/2020 6:22:19 PM By: Deon Pilling Entered By: Deon Pilling on 03/20/2020 13:24:40 -------------------------------------------------------------------------------- Pain Assessment Details Patient Name: Date of Service: [name], [name] 03/20/2020 1:00 PM Medical Record [id number] Patient Account Number: [id number] Date of Birth/Sex: Treating RN: 08/08/1951 (69 y.o. M) Primary Care Miguelangel Korn: [name] Other Clinician: Referring Halil Rentz: Treating Shandrell Boda/Extender:Robson, Jeronimo Greaves, Buena Irish in Treatment: 40 Active Problems Location of Pain Severity and Description of Pain Patient Has Paino No Site Locations Pain Management and Medication Current Pain Management: Electronic Signature(s) Signed: 04/09/2020 9:21:08 AM By: Sandre Kitty Entered By: Sandre Kitty on 03/20/2020 13:21:59 -------------------------------------------------------------------------------- Patient/Caregiver Education Details Patient Name: Date of Service: [name], [name] 3/25/2021andnbsp1:00 PM Medical Record [id number] Patient Account Number: [id number] Date of Birth/Gender: [birth year] (68 y.o. M) Treating RN: Deon Pilling Primary Care Physician: [name] Other Clinician: Referring Physician: Treating Physician/Extender:Robson, Birdena Crandall in Treatment: 61 Education Assessment Education Provided To: Patient Education Topics Provided Wound/Skin Impairment: Handouts: Skin Care Do's and Dont's Methods: Explain/Verbal Responses: Reinforcements needed Electronic Signature(s) Signed: 03/20/2020 6:22:19 PM By: Deon Pilling Entered By: Deon Pilling on 03/20/2020 13:24:53 -------------------------------------------------------------------------------- Wound Assessment Details Patient Name: Date of Service: [name], [name] 03/20/2020 1:00 PM Medical Record [id number] Patient Account Number:  [id number] Date of Birth/Sex: Treating RN: [birth year] (69 y.o. M) Primary Care Azalea Cedar: [name] Other Clinician: Referring Hattie Aguinaldo: Treating Ltanya Bayley/Extender:Robson, Jeronimo Greaves, Buena Irish in Treatment: 40 Wound Status Wound Number: 41 Primary Diabetic Wound/Ulcer of the Lower Extremity Etiology: Wound Location:  Right Metatarsal head second Wound Open Wounding Event: Gradually Appeared Status: Date Acquired: 07/23/2019 Comorbid Cataracts, Anemia, Arrhythmia, Congestive Weeks Of Treatment: 33 History: Heart Failure, Hypertension, Peripheral Arterial Clustered Wound: No Disease, Type II Diabetes, End Stage Renal Disease, History of Burn, Osteoarthritis, Osteomyelitis, Neuropathy Photos Photo Uploaded By: Mikeal Hawthorne on 03/28/2020 16:26:20 Wound Measurements Length: (cm) 0.5 % Reduction Width: (cm) 0.4 % Reduction Depth: (cm) 1 Epithelializ Area: (cm) 0.157 Tunneling: Volume: (cm) 0.157 Undermining Starting Ending Po Maximum D in Area: 91.1% in Volume: 70.4% ation: Small (1-33%) No : Yes Position (o'clock): 12 sition (o'clock): 12 istance: (cm) 0.9 Wound Description Classification: Grade 2 Foul Odor A Wound Margin: Well defined, not attached Slough/Fibr Exudate Amount: Small Exudate Type: Serosanguineous Exudate Color: red, brown Wound Bed Granulation Amount: Large (67-100%) Granulation Quality: Red Fascia Expo Necrotic Amount: None Present (0%) Fat Layer ( Tendon Expo Muscle Exp Joint Expo Bone Expos fter Cleansing: No ino No Exposed Structure sed: No Subcutaneous Tissue) Exposed: Yes sed: No osed: No sed: No ed: No Electronic Signature(s) Signed: 03/20/2020 5:37:17 PM By: Baruch Gouty RN, BSN Entered By: Baruch Gouty on 03/20/2020 13:32:34 -------------------------------------------------------------------------------- Vitals Details Patient Name: Date of Service: [name] 03/20/2020 1:00 PM Medical Record  [id number] Patient Account Number: [id number] Date of Birth/Sex: Treating RN: [birth year] (69 y.o. M) Primary Care Lannah Koike: [name] Other Clinician: Referring Julianny Milstein: Treating Orit Sanville/Extender:Robson, Jeronimo Greaves, Buena Irish in Treatment: 40 Vital Signs Time Taken: 13:21 Temperature (F): 98.3 Height (in): 73 Pulse (bpm): 74 Weight (lbs): 202 Respiratory Rate (breaths/min): 18 Body Mass Index (BMI): 26.6 Blood Pressure (mmHg): 95/49 Capillary Blood Glucose (mg/dl): 126 Reference Range: 80 - 120 mg / dl Electronic Signature(s) Signed: 04/09/2020 9:21:08 AM By: Sandre Kitty Entered By: Sandre Kitty on 03/20/2020 13:21:48
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
## 2020-04-10 ENCOUNTER — Other Ambulatory Visit: Payer: Self-pay

## 2020-04-10 ENCOUNTER — Encounter (HOSPITAL_BASED_OUTPATIENT_CLINIC_OR_DEPARTMENT_OTHER): Payer: Medicare Other | Admitting: Internal Medicine

## 2020-04-10 ENCOUNTER — Other Ambulatory Visit (HOSPITAL_COMMUNITY)
Admission: RE | Admit: 2020-04-10 | Discharge: 2020-04-10 | Disposition: A | Discharge status: Home / Self Care | Payer: Medicare Other | Source: Other Acute Inpatient Hospital | Attending: Internal Medicine | Admitting: Internal Medicine

## 2020-04-10 DIAGNOSIS — E11621 Type 2 diabetes mellitus with foot ulcer: Secondary | ICD-10-CM | POA: Insufficient documentation

## 2020-04-12 LAB — AEROBIC CULTURE W GRAM STAIN (SUPERFICIAL SPECIMEN)
Culture: NORMAL
Gram Stain: NONE SEEN

## 2020-04-12 NOTE — Progress Notes (Signed)
[name] ([id number]) Visit Report for 04/10/2020 HPI Details Patient Name: Date of Service: [name], [name] 04/10/2020 2:00 PM Medical Record [id number] Patient Account Number: [id number] Date of Birth/Sex: Treating RN: Oct 01, 1951 (69 y.o. [name] Primary Care Provider: [name] Other Clinician: Referring Provider: Treating Provider/Extender:Decoda Van, Jeronimo Greaves, Buena Irish in Treatment: 83 History of Present Illness Location: Patient presents with a wound to bilateral feet. Quality: Patient reports experiencing essentially no pain. Severity: Mildly severe wound with no evidence of infection Duration: Patient has had the wound for greater than 2 weeks prior to presenting for treatment HPI Description: The patient is a pleasant 69 yrs old bm here for evaluation of ulcers on the plantar aspect of both feet. 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 He has a left arm graft for access. He has been trying to stay off his feet for weeks but does not seem to have any improvement in the wound. He has been seeing someone at the foot center and was referred to the Wound Care center for further evaluation. 12/30/15 the patient has 3 wounds one over the right first metatarsal head and 2 on the left foot at the left fifth and lleft first metatarsal head. All of these look relatively similar. The one over the left fifth probe to bone I could not prove that any of the others did. I note his MRI in November that did not show osteomyelitis. His peripheral pulses seem robust. All of these underwent surgical debridement to remove callus nonviable skin and subcutaneous tissue 01/06/16; the patient had his sutures removed from the right fifth ray amputation. There may be a small open part of this superiorly but otherwise the incision looks good. Areas over his right first, left first and left fifth metatarsal head all  underwent surgical debridement as a varying degree of callus, skin and nonviable subcutaneous tissue. The area that is most worrisome is the right fifth metatarsal head which has a wound probes precariously close to bone. There is no purulent drainage or erythema 01/13/16; I'm not exactly sure of the status of the right fifth ray amputation site however he follows with Dr. [name] later this week. The area over his right first plantar metatarsal head, left first and fifth plantar metatarsal head are all in the same status. Thick circumferential callus, nonviable subcutaneous tissue. Culture of the left fifth did not culture last week 01/20/16; all of the patient's wounds appear and roughly the same state although his amputation site on the right lateral foot looks better. His wounds over the right first, left first and left fifth metatarsal heads all underwent difficult surgical debridement removing circumferential callus nonviable skin and subcutaneous tissue. There is no overt evidence of infection in these areas. MRI at the end of December of the left foot did not show osteomyelitis, right foot showed osteomyelitis of the right fifth digit he is now status post amputation. 01/27/16 the patient's wounds over his plantar first and fifth metatarsal heads on the left all appear better having been started on a total contact cast last week. They were debridement of circumferential callus and nonviable subcutaneous tissue as was the wound over the first metatarsal head. His surgical incision on the right also had some light surface debridement done. 03/23/2016 -- the patient was doing really well and most of his wounds had almost completely healed but now he came back today with a history of having a discharge from  the area of his right foot on the plantar aspect and also between his first and second toe. He also has had some discharge from the left foot. Addendum: I spoke to the PA [name] at  the dialysis center whose fax number is [phone]. We discussed the infection the patient has and she will put the patient on vancomycin and Fortaz until the final culture report is back. We will fax this as soon as available. 03/30/2016 -- left foot x-ray IMPRESSION:No definitive osteomyelitis noted. X-ray of the right foot -- IMPRESSION: 1. Soft tissue swelling. Prior amputation right fifth digit. No acute or focal bony abnormality identified. If osteomyelitis remains a clinical concern, MRI can be obtained. 2. Peripheral vascular disease. His culture reports have grown an MSSA -- and we will fax this report to his hemodialysis center. He was called in a prescription of oral doxycycline but I have told her not to fill this in as he is already on IV antibiotics. 04/06/2016 -- a few days ago, I spoke to the hemodialysis center nurse who had stopped the IV antibiotics and he was given a prescription for doxycycline 100 mg by mouth twice a day for a week and he is on this at the present time. 05/11/2016 -- he has recently seen his PCP this week and his hemoglobin A1c was 7. He is working on his paperwork to get his orthotic shoes. 05/25/2016 -- -- x-ray of the left foot IMPRESSION:No acute bony abnormality. No radiographic changes of acute osteomyelitis. No change since prior study. X-ray of the right foot -- IMPRESSION: Postsurgical changes are seen involving the fifth toe. No evidence of acute osteomyelitis. he developed a large blister on the medial part of his right foot and this opened out and drain fluid. 06/01/2016 -- he has his MRI to be done this afternoon. 06/15/2016 -- MRI of the left forefoot without contrast shows 2 separate regions of cutaneous and subcutaneous edema and possible ulceration and blistering along the ball of the foot. No obvious osteomyelitis identified. MRI of the right foot showed cutaneous and subcutaneous thickening plantar to the first digit sesamoid with  an ulcer crater but no underlying osteomyelitis is identified. 06/22/2016 -- the right foot plantar ulcer has been draining a lot of seropurulent material for the last few days. 06/30/2016 -- spoke to the dialysis center and I believe I spoke to [place] a PA at the center who discussed with me and agreed to putting Legrand Como on vancomycin until his cultures arrive. On review of his culture report no WBCs were seen or no organisms were seen and the culture was reincubated for better growth. The final report is back and there were no predominant growth including Streptococcus or Staphylococcus. Clinically though he has a lot of drainage from both wounds a lot of undermining and there is further blebs on the left foot towards the interspace between his first and second toe. 08/10/2016 -- the culture from the right foot showed normal skin flora and there was no Staphylococcus aureus was group A streptococcus isolated. 09/21/2016 -- -- MRI of the right foot was done on 09/13/2016 - IMPRESSION: Findings most consistent with acute osteomyelitis throughout the great toe, sesamoid bones and plantar aspect of the head of the first metatarsal. Fluid in the sheath of the flexor tendon of the great toe could be sympathetic but is worrisome for septic tenosynovitis. First MTP joint effusion worrisome for septic joint. He was admitted to the hospital on 09/11/2016 and treated for  a fever with vancomycin and cefepime. He was seen by Dr. Bobby Rumpf of infectious disease who recommended 6 weeks treatment with vancomycin and ceftazidime with his hemodialysis and to continue to see as in the wound clinic. Vascular consult was pending. The patient was discharged home on 09/14/2016 and he would continue with IV antibiotics for 6 weeks. 09/28/16 wound appears reasonably healthy. Continuing with total contact cast 10/05/16 wound is smaller and looking healthy. Continue with total contact cast. He continues on IV  antibiotics 10/12/2016 -- he has developed a new wound on the dorsal aspect of his left big toe and this is a superficial injury with no surrounding cellulitis. He has completed 30 days of IV antibiotics and is now ready to start his hyperbaric oxygen therapy as per his insurance company's recommendation. 10/19/2016 -- after the cast was removed on the right side he has got good resolution of his ulceration on the right plantar foot. he has a new wound on the left plantar foot in the region of his fourth metatarsal and this will need sharp debridement. 10/26/2016 -- Xray of the right foot complete: IMPRESSION: Changes consistent with osteomyelitis involving the head of the first metatarsal and base of the first proximal phalanx. The sesamoid bones are also likely involved given their positioning. 11/02/2016 -- there still awaiting insurance clearance for his hyperbaric oxygen therapy and hopefully he will begin treatment soon. 11/09/2016 -- started with hyperbaric oxygen therapy and had some barotrauma to the right ear and this was seen by ENT who was prescribed Afrin drops and would probably continue with HBO and he is scheduled for myringotomy tubes on Friday 11/16/2016 -- he had his myringotomy tubes placed on Friday and has been doing much better after that with some fluid draining out after hyperbaric treatment today. The pain was much better. 11/30/2016 -- over the last 2 days he noticed a swelling and change of color of his right second toe and this had been draining minimal fluid. 12/07/2016 -- x-ray of the right foot -- IMPRESSION:1. Progressive ulceration at the distal aspect of the second digit with significant soft tissue swelling and osseous changes in the distal phalanx compatible with osteomyelitis. 2. Chronic osteomyelitis at the first MTP joint. 3. Ulcerations at the second and third toes as well without definite osseous changes. Osteomyelitis is not excluded. 4. Fifth digit  amputation. On 12/01/2016 I spoke to Dr. Bobby Rumpf, the infectious disease specialist, who kindly agreed to treat this with IV antibiotics and he would call in the order to the dialysis center and this has been discussed in detail with the patient who will make the appropriate arrangements. The patient will also book an appointment as soon as possible to see Dr. [name] in the office. Of note the patient has not been on antibiotics this entire week as the dialysis center did not receive any orders from Dr. [name]. I got in touch with Dr. [name] who tells me the patient has an appointment to see him this coming Wednesday. 12/14/2016 -- the patient is on ceftazidime and vancomycin during his dialysis, and I understand this was put on by his nephrologist Dr. Raliegh Ip possibly after speaking with infectious disease Dr. [name] 12/23/16; patient was on my schedule today for a wound evaluation as he had difficulties with his schedule earlier this week. I note that he is on vancomycin and ceftazidine at dialysis. In spite of this he arrives today with a new wound on the base of the right second toe  this easily probes to bone. The known wound at the tip of the second toe With this area. He also has a superficial area on the medial aspect of the third toe on the side of the DIP. This does not appear to have much depth. The area on the plantar left foot is a deep area but did not probe the bone 12/28/16 -- he has brought in some lab work and the most recent labs done showed a hemoglobin of 12.1 hematocrit of 36.3, neutrophils of 51% WBC count of 5.6, BN of 53, albumin of 4.1 globulin of 3.7, vancomycin 13 g per mL. 01/04/2017 -- he saw Dr. [name] who has recommended a amputation of the right second toe and he is awaiting this date. He sees Dr. Bobby Rumpf of infectious disease tomorrow. The bone culture taken on 12/28/2016 had no growth in 2 days. 01/11/2017 -- was seen by Dr. Bobby Rumpf regarding  the management and has recommended a eval by vascular surgeons. He recommended to continue the antibiotics during his hemodialysis. Xray of the left foot -- IMPRESSION: No acute fracture, dislocation, or osseous erosion identified. 01/18/2017 -- he has his vascular workup later today and he is going to have his right foot second toe amputation this coming Friday by Dr. [name]. We have also put in for a another 30 treatments with hyperbaric oxygen therapy 01/25/2017 -- I have reviewed Dr. Donnetta Hutching is vascular report from last week where he reviewed him and thought his vascular function was good enough to heal his amputation site and no further tests were recommended. He also had his orthopedic related to surgery which is still pending the notes and we will review these next week. 02/01/2017 -- the operative remote of Dr. 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 02/08/2017 --he was seen by Dr. [name] on 02/03/2017 -- patient is on [place]. after a thorough review he had recommended to continue with antibiotics during hemodialysis. The patient was seen by Dr. [name], but I do not find any follow-up note on the electronic medical record. he has removed the dressing over the right foot to remove the sutures and asked him to see him back in a month's time 02/22/2017 -- patient has been febrile and has been having symptoms of the upper respiratory tract infection but has not been checked for the flu and it's been over 4 days now. He says he is feeling better today. Some drainage between his left first and second toe and this needed to be  looked at 03/08/2017 -- was seen by Dr. Bobby Rumpf on 03/07/2017 after review he will stop his antibiotics and see him back in a month to see if he is feeling well. 03/15/2017 -- he has completed his course of hyperbaric oxygen therapy and is doing fine with his health otherwise. 03/22/2017 -- his nutritionist at the dialysis center has recommended a protein supplement to help build his collagen and we will prescribe this for him when he has the details 05/03/2017 -- he recently noticed the area on the plantar aspect of his fifth metatarsal head which opened out and had minimal drainage. 05/10/2017 -- -- x-ray of the left foot --  IMPRESSION:1. No convincing conventional radiographic evidence of active osteomyelitis.2. Active soft tissue ulceration at the tip of the fifth digit, and at the lateral aspect of the foot adjacent the base of the fifth metatarsal. 3. Surgical changes of prior fourth toe amputation. 4. 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 5. Small vessel atherosclerotic vascular calcifications. 6. 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 ======= Readmission after 5 weeks: 06/15/2017 -- the patient returns after 5 weeks having had an MRI done on 05/17/2017 MRI of the left foot without contrast showed soft tissue ulcer overlying the base of the fifth metatarsal. 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 No drainable fluid collection to suggest an abscess. He was in hospital between 05/16/2017 and 05/25/2017 -- and on discharge was asked to follow-up with Dr. [name] and Dr. [name]. He was treated 2 weeks post discharge with vancomycin plus ceftriaxone with hemodialysis and oral metronidazole 500 mg 3 times a day. 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 last hemoglobin  A1c was 6.7% He had a postoperative MR of the foot on 05/23/2017 -- which showed no new areas of cortical bone loss, edema or soft tissue ulceration to suggest osteomyelitis. the patient has completed his course of IV antibiotics during dialysis and is to see the infectious disease doctor tomorrow. Dr. [name] had seen him after suture removal and asked him to keep the wound with a dry dressing 06/21/2017 -- he was seen by Dr. Bobby Rumpf of infectious disease on 06/16/2017 -- he stopped his Flagyl and will see him back in 6 weeks. He was asked to follow-up with Dr. [name] and with the wound clinic clinic. 07/05/17; bone biopsy from last time showed acute osteomyelitis. This is from the reminiscent in the left fifth metatarsal. Culture result is apparently still pending [holding for anaerobe}. From my understanding in this case this is been a progressive necrotic wound which is deteriorated markedly over the last 3 weeks since he returned here. He now has a large area of exposed bone which was biopsied and cultured last week. Dr. Graylon Good has put him on vancomycin and Fortaz during his hemodialysis and Flagyl orally. He is to see Dr. [name] next week 07/19/2017 -- the patient was reviewed by Dr. [name] of orthopedics who reviewed the case in detail and agreed with the plan to continue with IV antibiotics, aggressive wound care and hyperbaric oxygen therapy. He would see him back in 3 weeks' time 08/09/2017 -- saw Dr. Bobby Rumpf on 08/03/2017 -he was restarted on his antibiotics for 6 weeks which included vanco, ceftaz and Flagyl. he recommended continuing his antibiotics for 6 weeks and reevaluate his completion date. He would continue with wound care and hyperbaric oxygen therapy. 08/16/2017 -- I understand he will be completing his 6 weeks of antibiotics sometime later this week. 09/19/2017 -- he has been without his wound VAC for the last week due to lack of supplies. He has been  packing his wound with silver alginate. 10/04/2017 -- he has an appointment with Dr. [name] tomorrow and hence we will not apply the wound VAC after his dressing changes today. 10/11/2017 -- he was seen by Dr. Bobby Rumpf on 10/05/2017, noted that the patient was currently off antibiotics, and after thorough review he recommended he follow-up with the wound care as per plan and no  further antibiotics at this point. 11/29/17; patient is arrived for the wound on his left lateral foot.Marland Kitchen He is completed IV antibiotics and 2 rounds of hyperbaric oxygen for treatment of underlying osteomyelitis. He arrives today with a surface on most of the wound area however our intake nurse noted drainage from the superior aspect. This was brought to my attention. 12/13/2017 -- the right plantar foot had a large bleb and once the bleb was opened out a large callus and subcutaneous debris was removed and he has a plantar ulcer near the fourth metatarsal head 12/28/17 on evaluation today patient appears to be doing acutely worse in regard to his left foot. The wound which has been appearing to do better as now open up more deeply there is bone palpable at the base of the wound unfortunately. He tells me that this feels "like it did when he had osteomyelitis previously" he also noted that his second toe on the left foot appears to be doing worse and is swollen there does appear to be some fluid collected underneath. His right foot plantar ulcer appears to be doing somewhat better at this point and there really is no complication at the site currently. No fevers, chills, nausea, or vomiting noted at this time. Patient states that he normally has no pain at this site however. Today he is having significant pain. 01/03/18; culture done last week showed methicillin sensitive staph aureus and group B strep. He was on Septra however he arrives today with a fever of 101. He has a functional dialysis shunt in his left arm but  has had no pain here. No cough. He still makes some urine no dysuria he did have abdominal pain nausea and diarrhea over the weekend but is not had any diarrhea since yesterday. Otherwise he has no specific complaints 01/05/18; the patient returns today in follow-up for his presentation from 1/8. At that point he was febrile. I gave him some Levaquin adjusted for his dialysis status. He tells me the fever broke that night and it is not likely that this was actually a wound infection. He is gone on to have an MRI of the left foot; this showed interval development of an abnormal signal from the base of the third and fourth metatarsal and in the cuboid reminiscent consistent with osteomyelitis. There is no mention of the left second toe I think which was a concern when it was ordered. The patient is taking his Levaquin 01/10/18; the patient has completed his antibiotics today. The area on the left lateral foot is smaller but it still probes easily to bone. He has underlying osteomyelitis here and sees Dr. Megan Salon of infectious disease this week The area on the right third plantar metatarsal head still requires debridement not much change in dimensions He has a new wound on the medial tip of his right third toe again this probes to bone. He only noticed this 2 days ago 01/17/18; the patient saw Dr. [name] last week and he is back on Vanco and Fortaz at dialysis. 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 We are previously seeing him for a wound also on the base of the fourth med head on the right. X- ray of this area did not show osteomyelitis in relation to a new wound on the tip of his right third toe. Again this probes to bone. Finally his  second toe on the left which didn't really show anything on the MRI at least no report appears to have a separated cutaneous area which I think is  going to come right off the tip of his toe and leave exposed bone. Whether this is infectious or ischemic I am not clear Culture I did from the third toe last week which was his new wound was negative. Plain x-ray on the right foot did not show osteomyelitis in the toes 01/24/18; the patient is going went to see Dr. [name]. He is going to have a amputation of the left second and right third toe although paradoxically the left second toe looks better than last week. We have treating a deep probing wound on the left lateral foot and the area over the third metatarsal head on the right foot. 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 Also a debridement including bone of the left lateral foot and a closure which is still sutured. This is a bit surprising. Also apparent debridement of the base of the right third toe wound. 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 I'm not sure exactly where we are in the frame of things here. The wound on the left lateral foot is still sutured also the amputation sites of the left second and right third toe. He is been using Xeroform to the sutured areas silver alginate to the right foot 02/14/18; he continues on Rocephin at dialysis for osteomyelitis. I still don't have a good sense of where we are in the treatment duration. The wounds on the left lateral foot had the sutures removed and this clearly still probes deeply. We debrided the area with a #5 curet. We'll use silver alginate to the wound the area over the right third met head also required debridement of callus skin and subcutaneous tissue and necrotic debris over the wound surface. This tunnels superiorly but I did not unroofed this today. The areas on the tip of his toes have a  crusted surface eschar I did not debridement this either. 02/21/18; he continues on Rocephin at dialysis for osteomyelitis. I don't have a good sense of where we are and the treatment duration. The wounds on the left lateral foot is callused over on presentation but requires debridement. The area on the right third med head plantar aspect actually is measuring smaller 02/28/18;patient is on Vanco and Fortaz at dialysis, I'm not sure why I thought this was Rocephin above unless it is been changed. I don't have a good sense of time frame here. Using silver collagen to the area on the left lateral foot and right third med head plantar foot 03/07/18; patient is completing bank and Fortaz at dialysis soon. He is using Silver collagen to the area on the left lateral foot and the right third metatarsal head. He has a smaller superficial wound distal to the left lateral foot wound. 03/14/18-he is here in follow-up evaluation for multiple ulcerations to his bilateral feet. He presents with a new ulceration to the plantar aspect of the left foot underneath the blister, this was deroofed to reveal a partial thickness ulcer. He is voicing no complaints or concerns, tolerated dialysis yesterday. We will continue with same treatment plan and he will follow-up next week 03/21/18;  the patient has a area on the lateral left foot which still has a small probing area. The overall surface area of the wound is better. He presented with a new ulceration on the left foot plantar fifth metatarsal head last week. The area on the right third metatarsal head appears smaller.using silver alginate to all wound areas. The patient is changing the dressing himself. He is using a [place] forefoot offloading are on the right and a healing sandal on the left 03/28/18; original wound left lateral foot. Still nowhere close to looking like a heeling surface secondary wound on the left lateral foot at the base of his question fifth metatarsal  which was new last week Right third metatarsal head. We have been using silver alginate all wounds 04/04/18; the original wound on the left lateral foot again heavy callus surrounding thick nonviable subcutaneous tissue all requiring debridement. The new wound from 2 weeks ago just near this at the base of the fifth metatarsal head still looks about the same Unfortunately there is been deterioration in the third medical head wound on the right which now probes to bone. I must say this was a superficial wound at one point in time and I really don't have a good frame of reference year. I'll have to go back to look through records about what we know about the right foot. 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 He is also been to see Dr. [name]. Somewhere in here as somebody is ordered a VASCULAR evaluation which was done on 03/22/18. On the right is anterior tibial artery was monophasic triphasic at the posterior tibial artery. On the left anterior tibial artery monophasic posterior tibial artery monophasic. ABI in the right was 1.43 on the left 1.21. TBIs on the right at 0.41 and on the left at 0.32. A vascular consult was recommended and I think has been arranged. 04/11/18; [name] has not had an MRI of the right foot since 2017. Recent x-ray of the right foot done in January and February was negative. However he has had a major deterioration in the wound over the third met head. He is completed antibiotics last week at dialysis [place]. I think he is going to see vascular in follow-up. The areas on the left lateral foot and left plantar fifth metatarsal head both look satisfactory. I debrided both of these areas although the tissue here looks good. The area on the left lateral foot once probe to bone it certainly does not do that now 04/18/18; MRI of the right foot is on Thursday. He has  what looks to be serosanguineous purulent drainage coming out of the wound over the right third metatarsal head today. He completed antibiotics bank and Fortaz 2 weeks ago at dialysis. He has a vascular follow-up with regards to his arterial insufficiency although I don't exactly see when that is booked. The areas on the left foot including the lateral left foot and the plantar left fifth metatarsal head look about the same. 04/25/18-He is here in follow-up evaluation for bilateral foot ulcers. MRI obtained was negative for osteomyelitis. Wound culture was negative. We will continue with same treatment plan he'll follow-up next week 05/02/18; the right plantar foot wound over the third metatarsal head actually looks better than when I last saw this. His MRI was negative for osteomyelitis wound culture was negative. On the left plantar foot both wounds on  the plantar fifth metatarsal head and on the lateral foot both are covered in a very hard circumferential callus. 05/09/18; right plantar foot wound over the third metatarsal head stable from last week. Left plantar foot wound over the fifth metatarsal head also stable but with callus around both wound areas The area on the left lateral foot had thick callus over a surface and I had some thoughts about leaving this intact however it felt boggy. We've been using silver alginate all wounds 05/16/18; since the patient was last here he was hospitalized from 5/15 through 5/19. 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 This grew MRSA. He was placed on vancomycin.MRI of the left foot suggested "progressive" osteomyelitis and bone destruction of the cuboid and the base of the fifth metatarsal. Stable erosive changes of the base of the second metatarsal. I'm wondering if they're aware that he had surgery and debridement in the area of the underlying bone  previously by Dr. [name]. In any case, He was also revascularized with an angioplasty and stenting of the left tibial peroneal trunk and angioplasty of the left posterior tibial artery. He is on vancomycin at dialysis. He has a 2 week follow-up with infectious disease. He has vascular surgery follow-up. He was started on Plavix. 05/23/18; the patient's wound on the lateral left foot at the level of the fifth metatarsal head and the plantar wound on the plantar fifth metatarsal head both look better. The area on the right third metatarsal head still has depth and undermining. We've been using silver alginate. He is on vancomycin. He has been revascularized on the left 05/30/18; he continues on vancomycin at dialysis. Revascularized on the left. The area on the left lateral foot is just about closed. Unfortunately the area over the plantar fifth metatarsal head undermining superiorly as does the area over the right third metatarsal head. Both of these significantly deteriorated from last week. He has appointments next week with infectious disease and orthopedics I delayed putting on a cast on the left until those appointments which therefore we made we'll bring him in on Friday the 14th with the idea of a cast on the left foot 06/06/18; he continues on vancomycin at dialysis. Revascularized on the left. He arrives today with a ballotable swelling just above the area on the left lateral foot where his previous wound was. He has no pain but he is reasonably insensate. The difficult areas on the plantar left fifth metatarsal head looks stable whereas the area on the right third plantar metatarsal head about the same as last week there is undermining here although I did not unroofed this today. He required a considerable debridement of the swelling on the left lateral foot area and I unroofed an abscess with a copious amount of brown purulent material which I obtained for culture. I don't believe during his  recent hospitalization he had any further imaging although I need to review this. Previous cultures from this area done in this clinic showed MRSA, I would be surprised if this is not what this is currently even though he is on vancomycin. 06/13/18; he continues on vancomycin at dialysis however he finishes this on Sunday and then graduates the doxycycline previously prescribed by Dr. [name]. The abscess site on the lateral foot that I unroofed last time grew moderate amounts of methicillin-resistant staph aureus that is both vancomycin and tetracycline sensitive  so we should be okay from that regard. He arrives today in clinic with a connection between the abscess site and the area on the lateral foot. We've been using silver alginate to all his wound areas. The right third metatarsal head wound is measuring smaller. Using silver alginate on both wound areas 06/20/18; transitioning to doxycycline prescribed by Dr. [name]. The abscess site on the lateral foot that I unroofed 2 weeks ago grew MRSA. That area has largely closed down although the lateral part of his wound on the fifth metatarsal head still probes to bone. I suspect all of this was connected. On the right third metatarsal head smaller looking wound but surrounded by nonviable tissue that once again requires debridement using silver alginate on both areas 06/27/18; on doxycycline 100 twice a day. The abscess on the lateral foot has closed down. Although he still has the wound on the left fifth metatarsal head that extends towards the lateral part of the foot. The most lateral part of this wound probes to bone Right third metatarsal head still a deep probing wound with undermining. Nevertheless I elected not to debridement this this week If everything is copious static next week on the left I'm going to attempt a total contact cast 07/04/18; he is tolerating his doxycycline. The abscess on the left lateral foot is closed down although  there is still a deep wound here that probes to bone. We will use silver collagen under the total contact cast Silver collagen to the deep wound over the right third metatarsal head is well 07/11/18; he is tolerating doxycycline. The left lateral fifth plantar metatarsal head still probes deeply but I could not probe any bone. We've been using silver collagen and this will be the second week under the total contact cast Silver collagen to deep wound over the right third metatarsal head as well 07/18/18; he is still taking doxycycline. The left lateral fifth plantar metatarsal this week probes deeply with a large amount of exposed bone. Quite a deterioration. He required extensive debridement. Specimens of bone for pathology and CNS obtained Also considerable debridement on the right third metatarsal head 07/25/18; bone for pathology last week from the lateral left foot showed osteomyelitis. CandS of the bone Enterobacter. And Klebsiella. He is now on ciprofloxacin and the doxycycline is stopped [Dr. [name] of infectious disease] area He has an appointment with Dr. [name] tomorrow I'm going to leave the total contact cast off for this week. May wish to try to reapply that next week or the week after depending on the wound bed looks. I'm not sure if there is an operative option here, previously is followed with Dr. [name] 08/01/18 on evaluation today patient presents for reevaluation. He has been seen by infectious disease, Dr. [name], and he has placed the patient back on Ceftaz currently to be given to him at dialysis three times a week. 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 Fortunately there does not appear to be the evidence of infection at this point in time. Overall the patient has been tolerating the dressing changes without complication. We are no longer due lives in the cast of left foot secondary to the infection  obviously. 08/10/18 on evaluation today patient appears to be doing rather well all things considered in regard to his left plantar foot ulcer. He does have a significant callous on the lateral portion of his left foot he wonders if  I can help clean that away to some degree today. Fortunately he is having no evidence of infection at this time which is good news. No fevers chills noted. With that being said his right plantar foot actually does have a significant callous buildup around the wound opening this seems to not be doing as well as I would like at this point. I do believe that he would benefit from sharp debridement at this site. Subsequently I think a total contact cast would be helpful for the right foot as well. 08/17/18; the patient arrived today with a total contact cast on the right foot. This actually looks quite good. He had gone to see Dr. Megan Salon of infectious disease about the osteomyelitis on the left foot. I think he is on IV Fortaz at dialysis although I am not exactly sure of the rationale for the Fortaz at the time of this dictation. He also arrived in today with a swelling on the lateral left foot this is the site of his original wounds in fact when I first saw this man when he was under Dr. Ardeen Garland care I think this was the site of where the wound was located. It was not particularly tender however using a small scalpel I opened it to [place] some moderate amount of purulent drainage. 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 Last MRI was in May that showed progressive osteomyelitis and bone destruction of the cuboid and base of the fifth metatarsal. Last week he had an abscess over this same area this was removed. Culture was negative. We are continuing with a total contact cast to the area on the third metatarsal head on the right and making some  good progress here. The patient asked me again about renal transplant. He is not on the list because of the open wounds. 08/31/18; apparently the patient had an interruption in the [place] but he is now back on this at dialysis. Apparently there was a misunderstanding and Dr. Crissie Figures orders. Due to have the MRI of the left foot tonight. The area on the right foot continues to have callus thick skin and subcutaneous tissue around the wound edge that requires constant debridement however the wound is smaller we are using a total contact cast in this area. Alginate all wounds 09/07/18; without much surprise the MRI of his left foot showed osteomyelitis in the reminiscent of his fourth metatarsal but also the third metatarsal. She arrives in clinic today with the right foot and a total contact cast. There was purulent drainage coming out of the wound which I have cultured. Marked deterioration here with undermining widely around the wound orifice. Using pickups and a scalpel I remove callus and subcutaneous tissue from a substantial new opening. In a similar fashion the area on the left lateral foot that was blistered last week I have opened this and remove skin and subcutaneous tissue from this area to expose a obvious new wound. I think there is extension and communication between all of this on the left foot. The patient is on Fortaz at dialysis. Culture done of the right foot. We are clearly not making progress here. We have made him an appointment with Dr. [name] of orthopedics. I think an amputation of the left leg may be discussed. I don't think there is anything that can be done with foot salvage. 09/14/18; culture from the  right foot last time showed a very resistant MRSA. This is resistant to doxycycline. I'm going to try to get linezolid at least for a week. He does not have a appointment with Dr. [name] yet. 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 I'll send a message to Dr. [name] of infectious disease. I want the patient to see Dr. [name] to go over the pros and cons of an amputation which I think will be a BKA. The patient had a question about whether this is curative or not. I told him that I thought it would be although spread of staph aureus infection is not unheard of. MRI of the right foot was done at the end of April 2019 did not show osteomyelitis. The patient last saw Dr. [name] on 9/3. I'll send Dr. [name] a message. I would really like him to weigh the pros and cons of a BKA on the left otherwise he'll probably need IV antibiotics and perhaps hyperbaric oxygen again. He has not had a good response to this in the past. His MRI earlier this month showed progressive damage in the remnants of the fourth and third metatarsal 09/21/2018; sees Dr. [name] of orthopedics next week to discuss a left BKA in response to the osteomyelitis in the left foot. Using silver alginate to all wound areas. He is completing the Zyvox I did put in for him after last culture showed MRSA 09/29/2018; patient saw Dr. [name] of orthopedics discussed the osteomyelitis in the left foot third and fourth metatarsals. He did not recommend urgent surgery but certainly stated the only surgical option would be a BKA. He is communicating with Dr. [name]. The problem here is the instability of the areas on his foot which constantly generate draining abscesses. I will communicate with Dr. [name] about this. He has an appointment on 10/23 10/05/2018; patient sees Dr. [name] on 10/23. I have sent him a secure message not ordered any additional antibiotics for now. Patient continues to have 2 open areas on the left plantar foot at the fifth metatarsal head and the lateral aspect of the foot. These have not changed all that much. The area on the right third met head still has thick callus and surrounding subcutaneous tissue we have been using  silver alginate 10/12/2018; patient sees Dr. [name] or colleague on 10/23. 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 The area on the right plantar foot is just about closed. The superior wound on the left fifth metatarsal head is callused over but I am not sure if this is closed. The area below it is about the same. Considerable amount of callus on the lateral foot. We have been using silver alginate to the wounds 10/19/2018; the patient saw Dr. [name] on 10/22. He wishes to try and continue to save the left foot. He has been given 6 weeks of oral ciprofloxacin. We continue to put a total contact cast on the right foot. The area over the fifth metatarsal head on the left is callused over/perhaps healed but I did not remove the callus to find out. He still has the wound on the left lateral midfoot requiring debridement. We have been using silver alginate to all wound areas 10/26/2018 On the left foot our intake nurse noted some purulent drainage from the inferior wound [currently the only one that is not callused over] On the right  foot even though he is in total contact cast a considerable amount of thick black callus and surface eschar. On this side we have been using silver alginate under a total contact cast. he remains on ciprofloxacin as prescribed by Dr. [name] 11/02/2018; culture from last week which is done of the probing area on the left midfoot wound showed MRSA "few". I am going to need to contact Dr. [name] which I will do today I think he is going to need IV antibiotics again. The area on the left foot which was callused on the side is clearly separating today all of this was removed a copious amounts of callus and necrotic subcutaneous tissue. The area on the right plantar foot actually remained healthy looking with a healthy granulated base. We are using silver alginate to all wound areas 11/09/2018;  unfortunately neither 1 of the patient's wounds areas looks at all satisfactory. On the left he has considerable necrotic debris from the plantar wound laterally over the foot. I removed copious amounts of material including callus skin and subcutaneous tissue. On the right foot he arrives with undermining and frankly purulent drainage. Specimen obtained for culture and debridement of the callused skin and subcutaneous tissue from around the circumference. Because of this I cannot put him back in a total contact cast but to be truthful we are really unfortunately not making a lot of progress I did put in a secure text message to Dr. [name] wondering about the MRSA on the left foot. He suggested vancomycin although this is not started. I was left wondering if he expected me to send this into dialysis. Has we have more purulence on the right side wait for that result before calling dialysis 11/16/2018; I called dialysis earlier this week to get the vancomycin started. I think he got the first dose on Tuesday. The patient tells me that he fell earlier this week twisting his left foot and ankle and he is swelling. He continues to not look well. He had blood cultures done at dialysis on Tuesday he is not been informed of the results. 12/07/2018; the patient was admitted to hospital from 11/22/2018 through 12/02/2018. He underwent a left BKA. Apparently had staph sepsis. In the meantime being off the right foot this is closed over. He follows up with Dr. 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 Subsequent to this he actually had multiple falls in fact he tells me to following which calls the wound to be his apparently. He has been seeing Dr. [name] and his physician assistant in the interim. They actually did want him to come back for reevaluation to see if there's anything we can do for  me wound care perspective the help this area to heal more appropriately. The patient states that he does not have a tremendous amount of pain which is good news. No fevers, chills, nausea, or vomiting noted at this time. We have gotten approval from Dr. [name] office had [place] for [place] to treat the patient for his stop wound. This is due to the fact that the patient was in the 90 day postop global. 1/21; the patient does not have an open wound on the right foot although he does have some pressure areas that will need to be padded when he is transferring. The dehisced surgical wound looks clean although there is some undermining of 1.5 cm superiorly. We have been  using calcium alginate 1/28; patient arrives in our clinic for review of the left BKA stump wound. This appears healthy but there is undermining. TheraSkin #1 2/11; TheraSkin #2 2/25; TheraSkin #3. Wound is measuring smaller 3/10; TheraSkin #4 wound is measuring smaller 3/24; TheraSkin #5 wound is measuring smaller and looks healthy. 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 He has no open area on the right foot although he does have some callused area over the original third metatarsal head wound. The third metatarsal is also subluxed on the right foot. I have warned him today that he cannot consider wearing a prosthesis for at least a month but he can go for measurements. He is going to need to keep the right foot padded is much as possible in his diabetic shoe indefinitely READMISSION 06/12/2019 [name] is a type II diabetic on dialysis. He has been in this clinic multiple times with wounds on his bilateral lower extremities. 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 He also has a history of wounds on his right foot but tells me that everything is going well here. He actually came in  here with his prosthesis walking with a cane. We were all really quite gratified to see this He states over the last several weeks he has had a painful area at the tip of the left third finger. His dialysis shunt in his is in the left upper arm and this particularly hurts during dialysis when his fingers get numb and there is a lot of pain in the tip of his left third finger. I believe he is seen vascular surgery. He had a Doppler done to evaluate for a dialysis steal syndrome. He is going for a banding procedure 1 week from today towards the left AV fistula. I think if that is unsuccessful they will be looking at creating a new shunt. He had a course of doxycycline when he which he finished about a week ago. He has been using Bactroban to the finger 6/25; the patient had his banding procedure and things seem to be going better. He is not having pain in his hand at dialysis. The area on the tip of his finger seems about closed. He did however have a paronychia I the last time I saw him. I have been advising him to use topical Bactroban washing the finger. Culture I did last time showed a few staph epidermidis which I was willing to dismiss as superficial skin contaminant. He arrives today with the finger wound looking better however the paronychia a seems worse 7/9; 2-week follow-up. He does not have an open wound on the tip of his third left finger. I thought he had an initial ischemic wound on the tip of the finger as well as a paronychia a medially. All of this appears to be closed. 8/6-Patient returns after 3 weeks for follow-up and has a right second met head plantar callus with a significant area of maceration hiding an ulcer ABI repeated today on right - 1.26 8/13-Patient returns at 1 week, the right second met head plantar wound appears to be slightly better, it is surrounded by the callus area we are using silver alginate 8/20; the patient came back to clinic 2 weeks ago with a wound on  the right second metatarsal head. He apparently told me he developed callus  in this area but also went away from his custom shoes to a pair of running shoes. Using silver alginate. 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 There might be much we can do to offload this although the patient tells me he has a wheelchair that he is using to try and stay off the wound is much as possible 8/27; right second metatarsal head. Still small punched out area with thick callus and subcutaneous tissue around the wound. We have been using silver collagen. This is always been an issue with this man's wound especially on this foot 9/3; right second met head. Still the same punched-out area with thick callus and subcutaneous tissue around the wound removing the circumference demonstrates repetitively undermining area. I have removed all the subcutaneous tissue associated with this. We have been using silver collagen 9/15; right second metatarsal head. Same punched-out thick callus and subcutaneous tissue around the wound area. Still requiring debridement we have been using silver collagen I changed back to silver alginate X-ray last time showed no evidence of osteomyelitis. Culture showed Klebsiella and he was started and Keflex apparently just 4 days ago 9/24; right second metatarsal head. He is completed the antibiotics I gave him for 10 days. Same punched-out thick callus and subcutaneous tissue around the area. Still requiring debridement. We have been using silver alginate. The area itself looks swollen and this could be just [place] that comes on this area with walking on a prosthesis on the left however I think he needs an MRI and I am going to order that today. There is not an option to offload this further 10/1; right second plantar metatarsal head. MRI booked for October 6. Generally looking better today using silver alginate 10/8; right second plantar metatarsal head. MRI that was  done on 10/6 was negative for osteomyelitis noted prior amputations of the second and fourth toes at the [facility]. Prior amputation of the third toe to the base of the middle phalanx. Prior amputation of the fifth toe to the head of the metatarsal. 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 He does not recall about hearing about this. He did not have any pain but then again he has reduced sensation from diabetic neuropathy 10/15; right second plantar metatarsal head. Still in the same condition callus nonviable subcutaneous tissue which cleans up nicely with debridement but reforms by the next week. The patient tells me that he is offloading this is much as he can including taking his wheelchair to dialysis. We have been using silver alginate, changed to silver collagen today 10/22; right second plantar metatarsal head. Wound measures smaller but still thick callus around this wound. Still requiring debridement 11/5 right second plantar metatarsal head. Less callus around the wound circumference but still requiring debridement. There is still some undermining which I cleaned out the wound looks healthy but still considerable punched out depth with a relatively small circumference to the wound there is no palpable bone no purulent drainage 11/12; right second plantar metatarsal head. Small wound with thick callus tissue around the wound and significant undermining. We have been using silver collagen after my continuous debridements of this area 11/19; patient arrives in today with purulent drainage coming out of the wound in the second third met head area on the right foot. Serosanguineous thick drainage. Specimen obtained for culture. 11/21/2019 on evaluation today patient appears to be doing well with regard to his foot  ulcer. The good news is is culture came back negative for any bacteria there was no growth. The other good news is his x-ray also  appears to be doing well. The foot is also measuring much better than what it was. Overall I am very pleased with how things seem to be progressing. 12/22; patient was in [facility] for several weeks due to the death of a family member. He said he stayed off the wound using silver alginate. 01/03/2020. Patient has a small open area with roughly 0.3 cm of circumferential undermining. The orifice looks smaller. We have been using silver collagen. There is not a wound more aggressive way to offload this area as he has a prosthesis on the other leg 1/21; again the same small area is 2 weeks ago. We have been using silver collagen I change that to endoform today I really believe that this is an offloading issue 2/4; the area on the right third met head. We have been using endoform. 2/11 the area on the right third met head we have been using silver alginate. 2/25; the area on the right third met head. There is much less callus on this today. The patient states he is offloading this even more aggressively. As an example he is taking his wheelchair into dialysis on most days 3/4; right third metatarsal head. Again he has eschar over the surface of the wound which I removed with a #3 curette. Some subcutaneous debris. This has 0.8 cm of direct probing depth. I cannot feel any bone here. I did do a culture the area 3/11; right third metatarsal head. Again an eschared area over the wound which almost makes this look superficial. Again debridement reveals a wound with probing depth probably not much different from last week there is no palpable bone no palpable purulence. The patient tells me that he is making every effort to offload this area properly. He is taking wheelchair into dialysis etc. There is no option for forefoot offloading or a total contact cast. We used Oasis #1 today 3/18; again callus and eschar over the wound surface. I remove this but still a probing wound although only 3 mm  in depth this time. This is an improvement 3/25; again callus and eschar over the wound surface which I removed the wound is much deeper today at 0.7 cm. There is no palpable bone. Because of the appearance of this a culture was done. I am not going to put the Oasis back in this. Concerned about underlying infection. Notable for the fact that he is just finished doxycycline for the last go round of this 3/30; still 0.7 cm in depth which is disappointing. Culture I gave last time showed a few Staph aureus which is methicillin susceptible. This should have been taken care of by the recent course of doxycycline I gave him. We are using silver alginate 4/6; almost 1 cm in depth. We have been using silver alginate with underlying Bactroban. 4/15; about 1 cm in depth still. There is no palpable bone but there is undermining thick surface again as usual. Our intake nurse noted what looked to be purulent drainage I suppose that could have been Bactroban but I did a culture for CandS Electronic Signature(s) Signed: 04/12/2020 6:55:41 AM By: Linton Ham MD Entered By: Linton Ham on 04/10/2020 14:43:58 -------------------------------------------------------------------------------- Physical Exam Details Patient Name: Date of Service: [name] 04/10/2020 2:00 PM Medical Record [id number] Patient Account Number: [id number] Date of Birth/Sex: Treating RN: [birth year] (68  y.o. [name] Primary Care Provider: [name] Other Clinician: Referring Provider: Treating Provider/Extender:Yoselin Amerman, Jeronimo Greaves, Buena Irish in Treatment: 67 Constitutional Patient is hypotensive. However he appears well. This is not unusual for him. Pulse regular and within target range for patient.Marland Kitchen Respirations regular, non-labored and within target range.. Temperature is normal and within the target range for the patient.Marland Kitchen Appears in no distress. Notes Wound exam; still a small hole in  the middle of callus. 1 cm probing depth with undermining. I did not debride this today. CandS done with a swab culture Electronic Signature(s) Signed: 04/12/2020 6:55:41 AM By: Linton Ham MD Entered By: Linton Ham on 04/10/2020 14:45:39 -------------------------------------------------------------------------------- Physician Orders Details Patient Name: Date of Service: [name], [name] 04/10/2020 2:00 PM Medical Record [id number] Patient Account Number: [id number] Date of Birth/Sex: Treating RN: August 06, 1951 (69 y.o. 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 - Next week. Dressing Change Frequency Wound #41 Right Metatarsal head second Change dressing every day. Wound Cleansing May shower and wash wound with soap and water. Primary Wound Dressing Wound #41 Right Metatarsal head second Calcium Alginate with Silver - apply thin layer of bactroban then pack calcium alginate into wound bed. Secondary Dressing Wound #41 Right Metatarsal head second Foam - foam donut Kerlix/Rolled Gauze Dry Gauze Other: - felt to periwound and place one below wound. lightly wrap coban to hold dressing in place. felt / pad wound Edema Control Avoid standing for long periods of time Elevate legs to the level of the heart or above for 30 minutes daily and/or when sitting, a frequency of: - throughout the day. Off-Loading Other: - felt to periwound and place one below  wound. Additional Orders / Instructions Other: - minimize walking and standing on foot to aid in wound healing and callus buildup. Laboratory Bacteria identified in Unspecified specimen by Aerobe culture (MICRO) - someone will call with any abnormal results. - (ICD10 E11.621 - Type 2 diabetes mellitus with foot ulcer) LOINC Code: 330-0 Convenience Name: Areobic culture-specimen not specified Electronic Signature(s) Signed: 04/10/2020 4:13:32 PM By: Deon Pilling Signed: 04/12/2020 6:55:41 AM By: Linton Ham MD Entered By: Deon Pilling on 04/10/2020 14:42:22 -------------------------------------------------------------------------------- Problem List Details Patient Name: Date of Service: [name], [name] 04/10/2020 2:00 PM Medical Record [id number] Patient Account Number: [id number] Date of Birth/Sex: Treating RN: Mar 17, 1951 (69 y.o. 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, 06/12/2019 No Yes other extremity L03.115 Cellulitis of right lower limb 11/15/2019 No Yes Inactive Problems ICD-10 Code Description Active Date Inactive Date L03.114 Cellulitis of left upper limb 06/12/2019 06/12/2019 L98.498 Non-pressure chronic ulcer of skin of other sites with other 06/12/2019 06/12/2019 specified severity S61.209D Unspecified open wound of unspecified finger without damage 06/12/2019 06/12/2019 to nail, subsequent encounter Resolved Problems  Electronic Signature(s) Signed: 04/12/2020 6:55:41 AM By: Linton Ham MD Entered By: Linton Ham on 04/10/2020 14:42:30 -------------------------------------------------------------------------------- Progress Note Details Patient Name: Date of Service: [name], [name] 04/10/2020 2:00 PM Medical Record [id number] Patient Account Number: [id number] Date of Birth/Sex: Treating RN: [birth year] (69 y.o. [name] Primary Care Provider: [name] Other Clinician: Referring Provider: Treating Provider/Extender:Ineta Sinning, Jeronimo Greaves, Buena Irish in Treatment: 43 Subjective History of Present Illness (HPI) The following HPI elements were documented for the patient's wound: Location: Patient presents with a wound to bilateral feet. Quality: Patient reports experiencing essentially no pain. Severity: Mildly severe wound with no evidence of infection Duration: Patient has had the wound for greater than 2 weeks prior to presenting for treatment The patient is a pleasant 69 yrs old bm here for evaluation of ulcers on the plantar aspect of both feet. 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 He has a left arm graft for access. He has been trying to stay off his feet for weeks but does not seem to have any improvement in the wound. He has been seeing someone at the foot center and was referred to the Wound Care center for further evaluation. 12/30/15 the patient has 3 wounds one over the right first metatarsal head and 2 on the left foot at the left fifth and lleft first metatarsal head. All of these look relatively similar. The one over the left fifth probe to bone I could not prove that any of the others did. I note his MRI in November that did not show osteomyelitis. His peripheral pulses seem robust. All of these underwent surgical debridement to remove callus nonviable skin and subcutaneous tissue 01/06/16; the patient had his sutures removed from the right fifth ray amputation. There may be a small open part  of this superiorly but otherwise the incision looks good. Areas over his right first, left first and left fifth metatarsal head all underwent surgical debridement as a varying degree of callus, skin and nonviable subcutaneous tissue. The area that is most worrisome is the right fifth metatarsal head which has a wound probes precariously close to bone. There is no purulent drainage or erythema 01/13/16; I'm not exactly sure of the status of the right fifth ray amputation site however he follows with Dr. [name] later this week. The area over his right first plantar metatarsal head, left first and fifth plantar metatarsal head are all in the same status. Thick circumferential callus, nonviable subcutaneous tissue. Culture of the left fifth did not culture last week 01/20/16; all of the patient's wounds appear and roughly the same state although his amputation site on the right lateral foot looks better. His wounds over the right first, left first and left fifth metatarsal heads all underwent difficult surgical debridement removing circumferential callus nonviable skin and subcutaneous tissue. There is no overt evidence of infection in these areas. MRI at the end of December of the left foot did not show osteomyelitis, right foot showed osteomyelitis of the right fifth digit he is now status post amputation. 01/27/16 the patient's wounds over his plantar first and fifth metatarsal heads on the left all appear better having been started on a total contact cast last week. They were debridement of circumferential callus and nonviable subcutaneous tissue as was the wound over the first metatarsal head. His surgical incision on the right also had some light surface debridement done. 03/23/2016 -- the patient was doing really well and  most of his wounds had almost completely healed but now he came back today with a history of having a discharge from the area of his right foot on the plantar aspect and  also between his first and second toe. He also has had some discharge from the left foot. Addendum: I spoke to the PA [name] at the dialysis center whose fax number is [phone]. We discussed the infection the patient has and she will put the patient on vancomycin and Fortaz until the final culture report is back. We will fax this as soon as available. 03/30/2016 -- left foot x-ray IMPRESSION:No definitive osteomyelitis noted. X-ray of the right foot -- IMPRESSION: 1. Soft tissue swelling. Prior amputation right fifth digit. No acute or focal bony abnormality identified. If osteomyelitis remains a clinical concern, MRI can be obtained. 2. Peripheral vascular disease. His culture reports have grown an MSSA -- and we will fax this report to his hemodialysis center. He was called in a prescription of oral doxycycline but I have told her not to fill this in as he is already on IV antibiotics. 04/06/2016 -- a few days ago, I spoke to the hemodialysis center nurse who had stopped the IV antibiotics and he was given a prescription for doxycycline 100 mg by mouth twice a day for a week and he is on this at the present time. 05/11/2016 -- he has recently seen his PCP this week and his hemoglobin A1c was 7. He is working on his paperwork to get his orthotic shoes. 05/25/2016 -- -- x-ray of the left foot IMPRESSION:No acute bony abnormality. No radiographic changes of acute osteomyelitis. No change since prior study. X-ray of the right foot -- IMPRESSION: Postsurgical changes are seen involving the fifth toe. No evidence of acute osteomyelitis. he developed a large blister on the medial part of his right foot and this opened out and drain fluid. 06/01/2016 -- he has his MRI to be done this afternoon. 06/15/2016 -- MRI of the left forefoot without contrast shows 2 separate regions of cutaneous and subcutaneous edema and possible ulceration and blistering along the ball of the foot. No  obvious osteomyelitis identified. MRI of the right foot showed cutaneous and subcutaneous thickening plantar to the first digit sesamoid with an ulcer crater but no underlying osteomyelitis is identified. 06/22/2016 -- the right foot plantar ulcer has been draining a lot of seropurulent material for the last few days. 06/30/2016 -- spoke to the dialysis center and I believe I spoke to [place] a PA at the center who discussed with me and agreed to putting Legrand Como on vancomycin until his cultures arrive. On review of his culture report no WBCs were seen or no organisms were seen and the culture was reincubated for better growth. The final report is back and there were no predominant growth including Streptococcus or Staphylococcus. Clinically though he has a lot of drainage from both wounds a lot of undermining and there is further blebs on the left foot towards the interspace between his first and second toe. 08/10/2016 -- the culture from the right foot showed normal skin flora and there was no Staphylococcus aureus was group A streptococcus isolated. 09/21/2016 -- -- MRI of the right foot was done on 09/13/2016 - IMPRESSION: Findings most consistent with acute osteomyelitis throughout the great toe, sesamoid bones and plantar aspect of the head of the first metatarsal. Fluid in the sheath of the flexor tendon of the great toe could be sympathetic but is worrisome  for septic tenosynovitis. First MTP joint effusion worrisome for septic joint. He was admitted to the hospital on 09/11/2016 and treated for a fever with vancomycin and cefepime. He was seen by Dr. Bobby Rumpf of infectious disease who recommended 6 weeks treatment with vancomycin and ceftazidime with his hemodialysis and to continue to see as in the wound clinic. Vascular consult was pending. The patient was discharged home on 09/14/2016 and he would continue with IV antibiotics for 6 weeks. 09/28/16 wound appears reasonably  healthy. Continuing with total contact cast 10/05/16 wound is smaller and looking healthy. Continue with total contact cast. He continues on IV antibiotics 10/12/2016 -- he has developed a new wound on the dorsal aspect of his left big toe and this is a superficial injury with no surrounding cellulitis. He has completed 30 days of IV antibiotics and is now ready to start his hyperbaric oxygen therapy as per his insurance company's recommendation. 10/19/2016 -- after the cast was removed on the right side he has got good resolution of his ulceration on the right plantar foot. he has a new wound on the left plantar foot in the region of his fourth metatarsal and this will need sharp debridement. 10/26/2016 -- Xray of the right foot complete: IMPRESSION: Changes consistent with osteomyelitis involving the head of the first metatarsal and base of the first proximal phalanx. The sesamoid bones are also likely involved given their positioning. 11/02/2016 -- there still awaiting insurance clearance for his hyperbaric oxygen therapy and hopefully he will begin treatment soon. 11/09/2016 -- started with hyperbaric oxygen therapy and had some barotrauma to the right ear and this was seen by ENT who was prescribed Afrin drops and would probably continue with HBO and he is scheduled for myringotomy tubes on Friday 11/16/2016 -- he had his myringotomy tubes placed on Friday and has been doing much better after that with some fluid draining out after hyperbaric treatment today. The pain was much better. 11/30/2016 -- over the last 2 days he noticed a swelling and change of color of his right second toe and this had been draining minimal fluid. 12/07/2016 -- x-ray of the right foot -- IMPRESSION:1. Progressive ulceration at the distal aspect of the second digit with significant soft tissue swelling and osseous changes in the distal phalanx compatible with osteomyelitis. 2. Chronic osteomyelitis at the first  MTP joint. 3. Ulcerations at the second and third toes as well without definite osseous changes. Osteomyelitis is not excluded. 4. Fifth digit amputation. On 12/01/2016 oo I spoke to Dr. Bobby Rumpf, the infectious disease specialist, who kindly agreed to treat this with IV antibiotics and he would call in the order to the dialysis center and this has been discussed in detail with the patient who will make the appropriate arrangements. The patient will also book an appointment as soon as possible to see Dr. [name] in the office. Of note the patient has not been on antibiotics this entire week as the dialysis center did not receive any orders from Dr. [name]. I got in touch with Dr. [name] who tells me the patient has an appointment to see him this coming Wednesday. 12/14/2016 -- the patient is on ceftazidime and vancomycin during his dialysis, and I understand this was put on by his nephrologist Dr. Raliegh Ip possibly after speaking with infectious disease Dr. [name] 12/23/16; patient was on my schedule today for a wound evaluation as he had difficulties with his schedule earlier this week. I note that he is on vancomycin  and ceftazidine at dialysis. In spite of this he arrives today with a new wound on the base of the right second toe this easily probes to bone. The known wound at the tip of the second toe With this area. He also has a superficial area on the medial aspect of the third toe on the side of the DIP. This does not appear to have much depth. The area on the plantar left foot is a deep area but did not probe the bone 12/28/16 -- he has brought in some lab work and the most recent labs done showed a hemoglobin of 12.1 hematocrit of 36.3, neutrophils of 51% WBC count of 5.6, BN of 53, albumin of 4.1 globulin of 3.7, vancomycin 13 g per mL. 01/04/2017 -- he saw Dr. [name] who has recommended a amputation of the right second toe and he is awaiting this date. He sees Dr. Bobby Rumpf of  infectious disease tomorrow. The bone culture taken on 12/28/2016 had no growth in 2 days. 01/11/2017 -- was seen by Dr. Bobby Rumpf regarding the management and has recommended a eval by vascular surgeons. He recommended to continue the antibiotics during his hemodialysis. Xray of the left foot -- IMPRESSION: No acute fracture, dislocation, or osseous erosion identified. 01/18/2017 -- he has his vascular workup later today and he is going to have his right foot second toe amputation this coming Friday by Dr. [name]. We have also put in for a another 30 treatments with hyperbaric oxygen therapy 01/25/2017 -- I have reviewed Dr. Donnetta Hutching is vascular report from last week where he reviewed him and thought his vascular function was good enough to heal his amputation site and no further tests were recommended. He also had his orthopedic related to surgery which is still pending the notes and we will review these next week. 02/01/2017 -- the operative remote of Dr. 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 02/08/2017 --he was seen by Dr. [name] on 02/03/2017 -- patient is on [place]. after a thorough review he had recommended to continue with antibiotics during hemodialysis. The patient was seen by Dr. [name], but I do not find any follow-up note on the electronic medical record. he has removed the dressing over the right foot to remove the sutures and asked him to see him back in a month's time 02/22/2017 -- patient has been febrile and has been having symptoms of the upper respiratory tract infection but has not been checked for the  flu and it's been over 4 days now. He says he is feeling better today. Some drainage between his left first and second toe and this needed to be looked at 03/08/2017 -- was seen by Dr. Bobby Rumpf on 03/07/2017 after review he will stop his antibiotics and see him back in a month to see if he is feeling well. 03/15/2017 -- he has completed his course of hyperbaric oxygen therapy and is doing fine with his health otherwise. 03/22/2017 -- his nutritionist at the dialysis center has recommended a protein supplement to help build his collagen and we will prescribe this for him when he has the details 05/03/2017 -- he recently noticed the area on  the plantar aspect of his fifth metatarsal head which opened out and had minimal drainage. 05/10/2017 -- -- x-ray of the left foot -- IMPRESSION:1. No convincing conventional radiographic evidence of active osteomyelitis.2. Active soft tissue ulceration at the tip of the fifth digit, and at the lateral aspect of the foot adjacent the base of the fifth metatarsal. 3. Surgical changes of prior fourth toe amputation. 4. 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 5. Small vessel atherosclerotic vascular calcifications. 6. 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 ======= Readmission after 5 weeks: 06/15/2017 -- the patient returns after 5 weeks having had an MRI done on 05/17/2017 MRI of the left foot without contrast showed soft tissue ulcer overlying the base of the fifth metatarsal. 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 No drainable fluid collection to suggest an abscess. He was in hospital between 05/16/2017 and 05/25/2017 -- and on discharge was asked to follow-up with Dr. [name] and Dr. [name]. He was treated 2 weeks post discharge with vancomycin plus ceftriaxone with hemodialysis and oral metronidazole 500 mg 3 times a  day. 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 last hemoglobin A1c was 6.7% He had a postoperative MR of the foot on 05/23/2017 -- which showed no new areas of cortical bone loss, edema or soft tissue ulceration to suggest osteomyelitis. the patient has completed his course of IV antibiotics during dialysis and is to see the infectious disease doctor tomorrow. Dr. [name] had seen him after suture removal and asked him to keep the wound with a dry dressing 06/21/2017 -- he was seen by Dr. Bobby Rumpf of infectious disease on 06/16/2017 -- he stopped his Flagyl and will see him back in 6 weeks. He was asked to follow-up with Dr. [name] and with the wound clinic clinic. 07/05/17; bone biopsy from last time showed acute osteomyelitis. This is from the reminiscent in the left fifth metatarsal. Culture result is apparently still pending [holding for anaerobe}. From my understanding in this case this is been a progressive necrotic wound which is deteriorated markedly over the last 3 weeks since he returned here. He now has a large area of exposed bone which was biopsied and cultured last week. Dr. Graylon Good has put him on vancomycin and Fortaz during his hemodialysis and Flagyl orally. He is to see Dr. [name] next week 07/19/2017 -- the patient was reviewed by Dr. [name] of orthopedics who reviewed the case in detail and agreed with the plan to continue with IV antibiotics, aggressive wound care and hyperbaric oxygen therapy. He would see him back in 3 weeks' time 08/09/2017 -- saw Dr. Bobby Rumpf on 08/03/2017 -he was restarted on his antibiotics for 6 weeks which included vanco, ceftaz and Flagyl. he recommended continuing his antibiotics for 6 weeks and reevaluate his completion date. He would continue with wound care and hyperbaric oxygen therapy. 08/16/2017 -- I understand he will be completing his 6 weeks of  antibiotics sometime later this week. 09/19/2017 -- he has been without his wound VAC for the last week due to lack of supplies. He has been packing his wound with silver alginate. 10/04/2017 -- he has an appointment with Dr. [name] tomorrow and hence we will not apply the wound VAC after his dressing changes today. 10/11/2017 -- he was seen by Dr. Bobby Rumpf on 10/05/2017, noted  that the patient was currently off antibiotics, and after thorough review he recommended he follow-up with the wound care as per plan and no further antibiotics at this point. 11/29/17; patient is arrived for the wound on his left lateral foot.Marland Kitchen He is completed IV antibiotics and 2 rounds of hyperbaric oxygen for treatment of underlying osteomyelitis. He arrives today with a surface on most of the wound area however our intake nurse noted drainage from the superior aspect. This was brought to my attention. 12/13/2017 -- the right plantar foot had a large bleb and once the bleb was opened out a large callus and subcutaneous debris was removed and he has a plantar ulcer near the fourth metatarsal head 12/28/17 on evaluation today patient appears to be doing acutely worse in regard to his left foot. The wound which has been appearing to do better as now open up more deeply there is bone palpable at the base of the wound unfortunately. He tells me that this feels "like it did when he had osteomyelitis previously" he also noted that his second toe on the left foot appears to be doing worse and is swollen there does appear to be some fluid collected underneath. His right foot plantar ulcer appears to be doing somewhat better at this point and there really is no complication at the site currently. No fevers, chills, nausea, or vomiting noted at this time. Patient states that he normally has no pain at this site however. Today he is having significant pain. 01/03/18; culture done last week showed methicillin sensitive staph  aureus and group B strep. He was on [place] however he arrives today with a fever of 101. He has a functional dialysis shunt in his left arm but has had no pain here. No cough. He still makes some urine no dysuria he did have abdominal pain nausea and diarrhea over the weekend but is not had any diarrhea since yesterday. Otherwise he has no specific complaints 01/05/18; the patient returns today in follow-up for his presentation from 1/8. At that point he was febrile. I gave him some Levaquin adjusted for his dialysis status. He tells me the fever broke that night and it is not likely that this was actually a wound infection. He is gone on to have an MRI of the left foot; this showed interval development of an abnormal signal from the base of the third and fourth metatarsal and in the cuboid reminiscent consistent with osteomyelitis. There is no mention of the left second toe I think which was a concern when it was ordered. The patient is taking his Levaquin 01/10/18; the patient has completed his antibiotics today. The area on the left lateral foot is smaller but it still probes easily to bone. He has underlying osteomyelitis here and sees Dr. Megan Salon of infectious disease this week ooThe area on the right third plantar metatarsal head still requires debridement not much change in dimensions [facility] has a new wound on the medial tip of his right third toe again this probes to bone. He only noticed this 2 days ago 01/17/18; the patient saw Dr. [name] last week and he is back on Vanco and Fortaz at dialysis. 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 We are previously seeing him for a wound also on the base of the fourth med head on the right. X- ray of this area did  not show osteomyelitis in relation to a new wound on the tip of his right third toe. Again this probes to bone. Finally his  second toe on the left which didn't really show anything on the MRI at least no report appears to have a separated cutaneous area which I think is going to come right off the tip of his toe and leave exposed bone. Whether this is infectious or ischemic I am not clear Culture I did from the third toe last week which was his new wound was negative. Plain x-ray on the right foot did not show osteomyelitis in the toes 01/24/18; the patient is going went to see Dr. [name]. He is going to have a amputation of the left second and right third toe although paradoxically the left second toe looks better than last week. We have treating a deep probing wound on the left lateral foot and the area over the third metatarsal head on the right foot. 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 Also a debridement including bone of the left lateral foot and a closure which is still sutured. This is a bit surprising. Also apparent debridement of the base of the right third toe wound. 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 I'm not sure exactly where we are in the frame of things here. The wound on the left lateral foot is still sutured also the amputation sites of the left second and right third toe. He is been using Xeroform to the sutured areas silver alginate to the right foot 02/14/18; he continues on Rocephin at dialysis for osteomyelitis. I still don't have a good sense of where we are in the treatment duration. The wounds on the left lateral foot had the sutures removed and this clearly still probes deeply. We debrided the area with a #5 curet. We'll use silver alginate to the wound oothe area over the right third met head also required debridement of callus skin and subcutaneous  tissue and necrotic debris over the wound surface. This tunnels superiorly but I did not unroofed this today. ooThe areas on the tip of his toes have a crusted surface eschar I did not debridement this either. 02/21/18; he continues on Rocephin at dialysis for osteomyelitis. I don't have a good sense of where we are and the treatment duration. The wounds on the left lateral foot is callused over on presentation but requires debridement. The area on the right third med head plantar aspect actually is measuring smaller 02/28/18;patient is on Vanco and Fortaz at dialysis, I'm not sure why I thought this was Rocephin above unless it is been changed. I don't have a good sense of time frame here. Using silver collagen to the area on the left lateral foot and right third med head plantar foot 03/07/18; patient is completing bank and Fortaz at dialysis soon. He is using Silver collagen to the area on the left lateral foot and the right third metatarsal head. He has a smaller superficial wound distal to the left lateral foot wound. 03/14/18-he is here in follow-up evaluation for multiple ulcerations to his bilateral feet. He presents with a new ulceration to the plantar aspect of the left foot underneath the blister, this was deroofed to reveal a partial thickness ulcer.  He is voicing no complaints or concerns, tolerated dialysis yesterday. We will continue with same treatment plan and he will follow-up next week 03/21/18; the patient has a area on the lateral left foot which still has a small probing area. The overall surface area of the wound is better. He presented with a new ulceration on the left foot plantar fifth metatarsal head last week. The area on the right third metatarsal head appears smaller.using silver alginate to all wound areas. The patient is changing the dressing himself. He is using a Darco forefoot offloading are on the right and a healing sandal on the left 03/28/18; original wound left  lateral foot. Still nowhere close to looking like a heeling surface oosecondary wound on the left lateral foot at the base of his question fifth metatarsal which was new last week ooRight third metatarsal head. ooWe have been using silver alginate all wounds 04/04/18; the original wound on the left lateral foot again heavy callus surrounding thick nonviable subcutaneous tissue all requiring debridement. The new wound from 2 weeks ago just near this at the base of the fifth metatarsal head still looks about the same ooUnfortunately there is been deterioration in the third medical head wound on the right which now probes to bone. I must say this was a superficial wound at one point in time and I really don't have a good frame of reference year. I'll have to go back to look through records about what we know about the right foot. 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 He is also been to see Dr. [name]. Somewhere in here as somebody is ordered a VASCULAR evaluation which was done on 03/22/18. On the right is anterior tibial artery was monophasic triphasic at the posterior tibial artery. On the left anterior tibial artery monophasic posterior tibial artery monophasic. ABI in the right was 1.43 on the left 1.21. TBIs on the right at 0.41 and on the left at 0.32. A vascular consult was recommended and I think has been arranged. 04/11/18; [name] has not had an MRI of the right foot since 2017. Recent x-ray of the right foot done in January and February was negative. However he has had a major deterioration in the wound over the third met head. He is completed antibiotics last week at dialysis [place]. I think he is going to see vascular in follow-up. The areas on the left lateral foot and left plantar fifth metatarsal head both look satisfactory. I debrided both of these areas although the  tissue here looks good. The area on the left lateral foot once probe to bone it certainly does not do that now 04/18/18; MRI of the right foot is on Thursday. He has what looks to be serosanguineous purulent drainage coming out of the wound over the right third metatarsal head today. He completed antibiotics bank and Fortaz 2 weeks ago at dialysis. He has a vascular follow-up with regards to his arterial insufficiency although I don't exactly see when that is booked. The areas on the left foot including the lateral left foot and the plantar left fifth metatarsal head look about the same. 04/25/18-He is here in follow-up evaluation for bilateral foot ulcers. MRI obtained was negative for osteomyelitis. Wound culture was negative. We will continue with same treatment plan he'll follow-up next week 05/02/18; the right plantar foot wound over the third metatarsal head actually looks better  than when I last saw this. His MRI was negative for osteomyelitis wound culture was negative. ooOn the left plantar foot both wounds on the plantar fifth metatarsal head and on the lateral foot both are covered in a very hard circumferential callus. 05/09/18; right plantar foot wound over the third metatarsal head stable from last week. ooLeft plantar foot wound over the fifth metatarsal head also stable but with callus around both wound areas ooThe area on the left lateral foot had thick callus over a surface and I had some thoughts about leaving this intact however it felt boggy. ooWe've been using silver alginate all wounds 05/16/18; since the patient was last here he was hospitalized from 5/15 through 5/19. 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 This grew MRSA. He was placed on vancomycin.MRI of the left foot suggested "progressive" osteomyelitis and bone destruction of the cuboid and the base of the  fifth metatarsal. Stable erosive changes of the base of the second metatarsal. I'm wondering if they're aware that he had surgery and debridement in the area of the underlying bone previously by Dr. [name]. In any case, He was also revascularized with an angioplasty and stenting of the left tibial peroneal trunk and angioplasty of the left posterior tibial artery. He is on vancomycin at dialysis. He has a 2 week follow-up with infectious disease. He has vascular surgery follow-up. He was started on Plavix. 05/23/18; the patient's wound on the lateral left foot at the level of the fifth metatarsal head and the plantar wound on the plantar fifth metatarsal head both look better. The area on the right third metatarsal head still has depth and undermining. We've been using silver alginate. He is on vancomycin. He has been revascularized on the left 05/30/18; he continues on vancomycin at dialysis. Revascularized on the left. The area on the left lateral foot is just about closed. Unfortunately the area over the plantar fifth metatarsal head undermining superiorly as does the area over the right third metatarsal head. Both of these significantly deteriorated from last week. He has appointments next week with infectious disease and orthopedics I delayed putting on a cast on the left until those appointments which therefore we made we'll bring him in on Friday the 14th with the idea of a cast on the left foot 06/06/18; he continues on vancomycin at dialysis. Revascularized on the left. He arrives today with a ballotable swelling just above the area on the left lateral foot where his previous wound was. He has no pain but he is reasonably insensate. The difficult areas on the plantar left fifth metatarsal head looks stable whereas the area on the right third plantar metatarsal head about the same as last week there is undermining here although I did not unroofed this today. He required a considerable  debridement of the swelling on the left lateral foot area and I unroofed an abscess with a copious amount of brown purulent material which I obtained for culture. I don't believe during his recent hospitalization he had any further imaging although I need to review this. Previous cultures from this area done in this clinic showed MRSA, I would be surprised if this is not what this is currently even though he is on vancomycin. 06/13/18; he continues on vancomycin at dialysis however he finishes this on Sunday and then graduates the doxycycline previously prescribed by Dr. [name]. The abscess  site on the lateral foot that I unroofed last time grew moderate amounts of methicillin-resistant staph aureus that is both vancomycin and tetracycline sensitive so we should be okay from that regard. He arrives today in clinic with a connection between the abscess site and the area on the lateral foot. We've been using silver alginate to all his wound areas. The right third metatarsal head wound is measuring smaller. Using silver alginate on both wound areas 06/20/18; transitioning to doxycycline prescribed by Dr. [name]. The abscess site on the lateral foot that I unroofed 2 weeks ago grew MRSA. That area has largely closed down although the lateral part of his wound on the fifth metatarsal head still probes to bone. I suspect all of this was connected. ooOn the right third metatarsal head smaller looking wound but surrounded by nonviable tissue that once again requires debridement using silver alginate on both areas 06/27/18; on doxycycline 100 twice a day. The abscess on the lateral foot has closed down. Although he still has the wound on the left fifth metatarsal head that extends towards the lateral part of the foot. The most lateral part of this wound probes to bone ooRight third metatarsal head still a deep probing wound with undermining. Nevertheless I elected not to debridement this this week ooIf  everything is copious static next week on the left I'm going to attempt a total contact cast 07/04/18; he is tolerating his doxycycline. The abscess on the left lateral foot is closed down although there is still a deep wound here that probes to bone. We will use silver collagen under the total contact cast Silver collagen to the deep wound over the right third metatarsal head is well 07/11/18; he is tolerating doxycycline. The left lateral fifth plantar metatarsal head still probes deeply but I could not probe any bone. We've been using silver collagen and this will be the second week under the total contact cast ooSilver collagen to deep wound over the right third metatarsal head as well 07/18/18; he is still taking doxycycline. The left lateral fifth plantar metatarsal this week probes deeply with a large amount of exposed bone. Quite a deterioration. He required extensive debridement. Specimens of bone for pathology and CNS obtained ooAlso considerable debridement on the right third metatarsal head 07/25/18; bone for pathology last week from the lateral left foot showed osteomyelitis. CandS of the bone Enterobacter. And Klebsiella. He is now on ciprofloxacin and the doxycycline is stopped [Dr. [name] of infectious disease] area He has an appointment with Dr. [name] tomorrow I'm going to leave the total contact cast off for this week. May wish to try to reapply that next week or the week after depending on the wound bed looks. I'm not sure if there is an operative option here, previously is followed with Dr. [name] 08/01/18 on evaluation today patient presents for reevaluation. He has been seen by infectious disease, Dr. [name], and he has placed the patient back on Ceftaz currently to be given to him at dialysis three times a week. 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 Fortunately there does not appear to be the evidence  of infection at this point in time. Overall the patient has been tolerating the dressing changes without complication. We are no longer due lives in the cast of left foot secondary to the infection obviously. 08/10/18 on evaluation today patient appears to be doing rather well all things considered in  regard to his left plantar foot ulcer. He does have a significant callous on the lateral portion of his left foot he wonders if I can help clean that away to some degree today. Fortunately he is having no evidence of infection at this time which is good news. No fevers chills noted. With that being said his right plantar foot actually does have a significant callous buildup around the wound opening this seems to not be doing as well as I would like at this point. I do believe that he would benefit from sharp debridement at this site. Subsequently I think a total contact cast would be helpful for the right foot as well. 08/17/18; the patient arrived today with a total contact cast on the right foot. This actually looks quite good. He had gone to see Dr. Megan Salon of infectious disease about the osteomyelitis on the left foot. I think he is on IV Fortaz at dialysis although I am not exactly sure of the rationale for the Fortaz at the time of this dictation. He also arrived in today with a swelling on the lateral left foot this is the site of his original wounds in fact when I first saw this man when he was under Dr. Ardeen Garland care I think this was the site of where the wound was located. It was not particularly tender however using a small scalpel I opened it to [place] some moderate amount of purulent drainage. 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 Last MRI was in May that showed progressive osteomyelitis and bone destruction of the cuboid and base of the fifth metatarsal. Last  week he had an abscess over this same area this was removed. Culture was negative. ooWe are continuing with a total contact cast to the area on the third metatarsal head on the right and making some good progress here. The patient asked me again about renal transplant. He is not on the list because of the open wounds. 08/31/18; apparently the patient had an interruption in the [place] but he is now back on this at dialysis. Apparently there was a misunderstanding and Dr. Crissie Figures orders. Due to have the MRI of the left foot tonight. ooThe area on the right foot continues to have callus thick skin and subcutaneous tissue around the wound edge that requires constant debridement however the wound is smaller we are using a total contact cast in this area. Alginate all wounds 09/07/18; without much surprise the MRI of his left foot showed osteomyelitis in the reminiscent of his fourth metatarsal but also the third metatarsal. She arrives in clinic today with the right foot and a total contact cast. There was purulent drainage coming out of the wound which I have cultured. Marked deterioration here with undermining widely around the wound orifice. Using pickups and a scalpel I remove callus and subcutaneous tissue from a substantial new opening. ooIn a similar fashion the area on the left lateral foot that was blistered last week I have opened this and remove skin and subcutaneous tissue from this area to expose a obvious new wound. I think there is extension and communication between all of this on the left foot. ooThe patient is on Fortaz at dialysis. Culture done of the right foot. We are clearly not making progress here. ooWe have made him an appointment with Dr. [name] of orthopedics. I think an amputation  of the left leg may be discussed. I don't think there is anything that can be done with foot salvage. 09/14/18; culture from the right foot last time showed a very resistant MRSA. This is  resistant to doxycycline. I'm going to try to get linezolid at least for a week. He does not have a appointment with Dr. [name] yet. 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 I'll send a message to Dr. [name] of infectious disease. I want the patient to see Dr. [name] to go over the pros and cons of an amputation which I think will be a BKA. The patient had a question about whether this is curative or not. I told him that I thought it would be although spread of staph aureus infection is not unheard of. MRI of the right foot was done at the end of April 2019 did not show osteomyelitis. The patient last saw Dr. [name] on 9/3. I'll send Dr. [name] a message. I would really like him to weigh the pros and cons of a BKA on the left otherwise he'll probably need IV antibiotics and perhaps hyperbaric oxygen again. He has not had a good response to this in the past. His MRI earlier this month showed progressive damage in the remnants of the fourth and third metatarsal 09/21/2018; sees Dr. [name] of orthopedics next week to discuss a left BKA in response to the osteomyelitis in the left foot. Using silver alginate to all wound areas. He is completing the Zyvox I did put in for him after last culture showed MRSA 09/29/2018; patient saw Dr. [name] of orthopedics discussed the osteomyelitis in the left foot third and fourth metatarsals. He did not recommend urgent surgery but certainly stated the only surgical option would be a BKA. He is communicating with Dr. [name]. The problem here is the instability of the areas on his foot which constantly generate draining abscesses. I will communicate with Dr. [name] about this. He has an appointment on 10/23 10/05/2018; patient sees Dr. [name] on 10/23. I have sent him a secure message not ordered any additional antibiotics for now. Patient continues to have 2 open areas on the left plantar foot at the  fifth metatarsal head and the lateral aspect of the foot. These have not changed all that much. The area on the right third met head still has thick callus and surrounding subcutaneous tissue we have been using silver alginate 10/12/2018; patient sees Dr. [name] or colleague on 10/23. 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 The area on the right plantar foot is just about closed. The superior wound on the left fifth metatarsal head is callused over but I am not sure if this is closed. The area below it is about the same. Considerable amount of callus on the lateral foot. We have been using silver alginate to the wounds 10/19/2018; the patient saw Dr. [name] on 10/22. He wishes to try and continue to save the left foot. He has been given 6 weeks of oral ciprofloxacin. We continue to put a total contact cast on the right foot. The area over the fifth metatarsal head on the left is callused over/perhaps healed but I did not remove the callus to find out. He still has the wound on the left lateral midfoot requiring debridement. We have been using silver alginate to all wound areas 10/26/2018 ooOn the left  foot our intake nurse noted some purulent drainage from the inferior wound [currently the only one that is not callused over] ooOn the right foot even though he is in total contact cast a considerable amount of thick black callus and surface eschar. On this side we have been using silver alginate under a total contact cast. oohe remains on ciprofloxacin as prescribed by Dr. [name] 11/02/2018; culture from last week which is done of the probing area on the left midfoot wound showed MRSA "few". I am going to need to contact Dr. [name] which I will do today I think he is going to need IV antibiotics again. The area on the left foot which was callused on the side is clearly separating today all of this was removed a copious  amounts of callus and necrotic subcutaneous tissue. ooThe area on the right plantar foot actually remained healthy looking with a healthy granulated base. ooWe are using silver alginate to all wound areas 11/09/2018; unfortunately neither 1 of the patient's wounds areas looks at all satisfactory. On the left he has considerable necrotic debris from the plantar wound laterally over the foot. I removed copious amounts of material including callus skin and subcutaneous tissue. On the right foot he arrives with undermining and frankly purulent drainage. Specimen obtained for culture and debridement of the callused skin and subcutaneous tissue from around the circumference. Because of this I cannot put him back in a total contact cast but to be truthful we are really unfortunately not making a lot of progress I did put in a secure text message to Dr. [name] wondering about the MRSA on the left foot. He suggested vancomycin although this is not started. I was left wondering if he expected me to send this into dialysis. Has we have more purulence on the right side wait for that result before calling dialysis 11/16/2018; I called dialysis earlier this week to get the vancomycin started. I think he got the first dose on Tuesday. The patient tells me that he fell earlier this week twisting his left foot and ankle and he is swelling. He continues to not look well. He had blood cultures done at dialysis on Tuesday he is not been informed of the results. 12/07/2018; the patient was admitted to hospital from 11/22/2018 through 12/02/2018. He underwent a left BKA. Apparently had staph sepsis. In the meantime being off the right foot this is closed over. He follows up with Dr. 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 Subsequent to this he actually had multiple falls in fact he tells me to  following which calls the wound to be his apparently. He has been seeing Dr. [name] and his physician assistant in the interim. They actually did want him to come back for reevaluation to see if there's anything we can do for me wound care perspective the help this area to heal more appropriately. The patient states that he does not have a tremendous amount of pain which is good news. No fevers, chills, nausea, or vomiting noted at this time. We have gotten approval from Dr. [name] office had [place] for [place] to treat the patient for his stop wound. This is due to the fact that the patient was in the 90 day postop global. 1/21; the patient does not have an open wound on the right foot although he does have some pressure areas that will need to  be padded when he is transferring. The dehisced surgical wound looks clean although there is some undermining of 1.5 cm superiorly. 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 This appears healthy but there is undermining. TheraSkin #1 2/11; TheraSkin #2 2/25; TheraSkin #3. Wound is measuring smaller 3/10; TheraSkin #4 wound is measuring smaller 3/24; TheraSkin #5 wound is measuring smaller and looks healthy. 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 He has no open area on the right foot although he does have some callused area over the original third metatarsal head wound. The third metatarsal is also subluxed on the right foot. I have warned him today that he cannot consider wearing a prosthesis for at least a month but he can go for measurements. He is going to need to keep the right foot padded is much as possible in his diabetic shoe indefinitely READMISSION 06/12/2019 [name] is a type II diabetic on dialysis. He has been in this clinic multiple times with wounds on his bilateral lower extremities. Most recently here at the beginning of  this year for 3 months with a wound on his left BKA amputation site which we managed to get to close over. He also has a history of wounds on his right foot but tells me that everything is going well here. He actually came in here with his prosthesis walking with a cane. We were all really quite gratified to see this He states over the last several weeks he has had a painful area at the tip of the left third finger. His dialysis shunt in his is in the left upper arm and this particularly hurts during dialysis when his fingers get numb and there is a lot of pain in the tip of his left third finger. I believe he is seen vascular surgery. He had a Doppler done to evaluate for a dialysis steal syndrome. He is going for a banding procedure 1 week from today towards the left AV fistula. I think if that is unsuccessful they will be looking at creating a new shunt. He had a course of doxycycline when he which he finished about a week ago. He has been using Bactroban to the finger 6/25; the patient had his banding procedure and things seem to be going better. He is not having pain in his hand at dialysis. The area on the tip of his finger seems about closed. He did however have a paronychia I the last time I saw him. I have been advising him to use topical Bactroban washing the finger. Culture I did last time showed a few staph epidermidis which I was willing to dismiss as superficial skin contaminant. He arrives today with the finger wound looking better however the paronychia a seems worse 7/9; 2-week follow-up. He does not have an open wound on the tip of his third left finger. I thought he had an initial ischemic wound on the tip of the finger as well as a paronychia a medially. All of this appears to be closed. 8/6-Patient returns after 3 weeks for follow-up and has a right second met head plantar callus with a significant area of maceration hiding an ulcer ABI repeated today on right -  1.26 8/13-Patient returns at 1 week, the right second met head plantar wound appears to be slightly better, it is surrounded by the callus area we are using silver alginate 8/20; the  patient came back to clinic 2 weeks ago with a wound on the right second metatarsal head. He apparently told me he developed callus in this area but also went away from his custom shoes to a pair of running shoes. Using silver alginate. 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 There might be much we can do to offload this although the patient tells me he has a wheelchair that he is using to try and stay off the wound is much as possible 8/27; right second metatarsal head. Still small punched out area with thick callus and subcutaneous tissue around the wound. We have been using silver collagen. This is always been an issue with this man's wound especially on this foot 9/3; right second met head. Still the same punched-out area with thick callus and subcutaneous tissue around the wound removing the circumference demonstrates repetitively undermining area. I have removed all the subcutaneous tissue associated with this. We have been using silver collagen 9/15; right second metatarsal head. Same punched-out thick callus and subcutaneous tissue around the wound area. Still requiring debridement we have been using silver collagen I changed back to silver alginate X-ray last time showed no evidence of osteomyelitis. Culture showed Klebsiella and he was started and Keflex apparently just 4 days ago 9/24; right second metatarsal head. He is completed the antibiotics I gave him for 10 days. Same punched-out thick callus and subcutaneous tissue around the area. Still requiring debridement. We have been using silver alginate. The area itself looks swollen and this could be just [place] that comes on this area with walking on a prosthesis on the left however I think he needs an MRI and I am going to order  that today. There is not an option to offload this further 10/1; right second plantar metatarsal head. MRI booked for October 6. Generally looking better today using silver alginate 10/8; right second plantar metatarsal head. MRI that was done on 10/6 was negative for osteomyelitis noted prior amputations of the second and fourth toes at the [facility]. Prior amputation of the third toe to the base of the middle phalanx. Prior amputation of the fifth toe to the head of the metatarsal. 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 He does not recall about hearing about this. He did not have any pain but then again he has reduced sensation from diabetic neuropathy 10/15; right second plantar metatarsal head. Still in the same condition callus nonviable subcutaneous tissue which cleans up nicely with debridement but reforms by the next week. The patient tells me that he is offloading this is much as he can including taking his wheelchair to dialysis. We have been using silver alginate, changed to silver collagen today 10/22; right second plantar metatarsal head. Wound measures smaller but still thick callus around this wound. Still requiring debridement 11/5 right second plantar metatarsal head. Less callus around the wound circumference but still requiring debridement. There is still some undermining which I cleaned out the wound looks healthy but still considerable punched out depth with a relatively small circumference to the wound there is no palpable bone no purulent drainage 11/12; right second plantar metatarsal head. Small wound with thick callus tissue around the wound and significant undermining. We have been using silver collagen after my continuous debridements of this area 11/19; patient arrives in today with purulent drainage coming out of the wound in the second third met head area on the  right foot. Serosanguineous thick drainage. Specimen  obtained for culture. 11/21/2019 on evaluation today patient appears to be doing well with regard to his foot ulcer. The good news is is culture came back negative for any bacteria there was no growth. The other good news is his x-ray also appears to be doing well. The foot is also measuring much better than what it was. Overall I am very pleased with how things seem to be progressing. 12/22; patient was in [facility] for several weeks due to the death of a family member. He said he stayed off the wound using silver alginate. 01/03/2020. Patient has a small open area with roughly 0.3 cm of circumferential undermining. The orifice looks smaller. We have been using silver collagen. There is not a wound more aggressive way to offload this area as he has a prosthesis on the other leg 1/21; again the same small area is 2 weeks ago. We have been using silver collagen I change that to endoform today I really believe that this is an offloading issue 2/4; the area on the right third met head. We have been using endoform. 2/11 the area on the right third met head we have been using silver alginate. 2/25; the area on the right third met head. There is much less callus on this today. The patient states he is offloading this even more aggressively. As an example he is taking his wheelchair into dialysis on most days 3/4; right third metatarsal head. Again he has eschar over the surface of the wound which I removed with a #3 curette. Some subcutaneous debris. This has 0.8 cm of direct probing depth. I cannot feel any bone here. I did do a culture the area 3/11; right third metatarsal head. Again an eschared area over the wound which almost makes this look superficial. Again debridement reveals a wound with probing depth probably not much different from last week there is no palpable bone no palpable purulence. The patient tells me that he is making every effort to offload this area properly. He is  taking wheelchair into dialysis etc. There is no option for forefoot offloading or a total contact cast. We used Oasis #1 today 3/18; again callus and eschar over the wound surface. I remove this but still a probing wound although only 3 mm in depth this time. This is an improvement 3/25; again callus and eschar over the wound surface which I removed the wound is much deeper today at 0.7 cm. There is no palpable bone. Because of the appearance of this a culture was done. I am not going to put the Oasis back in this. Concerned about underlying infection. Notable for the fact that he is just finished doxycycline for the last go round of this 3/30; still 0.7 cm in depth which is disappointing. Culture I gave last time showed a few Staph aureus which is methicillin susceptible. This should have been taken care of by the recent course of doxycycline I gave him. We are using silver alginate 4/6; almost 1 cm in depth. We have been using silver alginate with underlying Bactroban. 4/15; about 1 cm in depth still. There is no palpable bone but there is undermining thick surface again as usual. Our intake nurse noted what looked to be purulent drainage I suppose that could have been Bactroban but I did a culture for CandS Objective Constitutional Patient is hypotensive. However he appears well. This is not unusual for him. Pulse regular and within target range for  patient.Marland Kitchen Respirations regular, non-labored and within target range.. Temperature is normal and within the target range for the patient.Marland Kitchen Appears in no distress. Vitals Time Taken: 2:15 PM, Height: 73 in, Weight: 202 lbs, BMI: 26.6, Temperature: 97.8 F, Pulse: 98 bpm, Respiratory Rate: 18 breaths/min, Blood Pressure: 94/56 mmHg, Capillary Blood Glucose: 151 mg/dl. General Notes: patient stated CBG was 141 this morning General Notes: Wound exam; still a small hole in the middle of callus. 1 cm probing depth with undermining. I did not debride  this today. CandS done with a swab culture Integumentary (Hair, Skin) Wound #41 status is Open. Original cause of wound was Gradually Appeared. The wound is located on the Right Metatarsal head second. The wound measures 0.3cm length x 0.3cm width x 1cm depth; 0.071cm^2 area and 0.071cm^3 volume. There is Fat Layer (Subcutaneous Tissue) Exposed exposed. There is no tunneling noted, however, there is undermining starting at 12:00 and ending at 12:00 with a maximum distance of 1.3cm. There is a medium amount of purulent drainage noted. The wound margin is well defined and not attached to the wound base. There is large (67-100%) red granulation within the wound bed. There is a small (1-33%) amount of necrotic tissue within the wound bed including Adherent Slough. 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 - Next week. Dressing Change Frequency: Wound #41 Right Metatarsal head second: Change dressing every day. Wound Cleansing: May shower and wash wound with soap and water. Primary Wound Dressing: Wound #41 Right Metatarsal head second: Calcium Alginate with Silver - apply thin layer of bactroban then pack calcium alginate into wound bed. Secondary Dressing: Wound #41 Right Metatarsal head second: Foam - foam donut Kerlix/Rolled Gauze Dry Gauze Other: - felt to periwound and place one below wound. lightly wrap coban to hold dressing in place. felt / pad wound Edema Control: Avoid standing for long periods of time Elevate legs to the level of the heart or above for 30 minutes daily and/or when sitting, a frequency of: - throughout the day. Off-Loading: Other: - felt to periwound and place one below  wound. Additional Orders / Instructions: Other: - minimize walking and standing on foot to aid in wound healing and callus buildup. Laboratory ordered were: Areobic culture-specimen not specified - someone will call with any abnormal results. 1. Continue with the silver alginate and Bactroban 2. await CandS Electronic Signature(s) Signed: 04/12/2020 6:55:41 AM By: Linton Ham MD Entered By: Linton Ham on 04/10/2020 14:46:37 -------------------------------------------------------------------------------- SuperBill Details Patient Name: Date of Service: [name] 04/10/2020 Medical Record [id number] Patient Account Number: [id number] Date of Birth/Sex: Treating RN: [id number] (69 y.o. [name] Primary Care Provider: [name] Other Clinician: Referring Provider: Treating Provider/Extender:Everard Interrante, Jeronimo Greaves, Buena Irish in Treatment: 43 Diagnosis Coding ICD-10 Codes Code Description E11.621 Type 2 diabetes mellitus with foot ulcer L97.512 Non-pressure chronic ulcer of other part of right foot with fat layer exposed I70.298 Other atherosclerosis of native arteries of extremities, other extremity L03.115 Cellulitis of right lower limb Facility Procedures The patient participates with Medicare or their insurance follows the Medicare Facility Guidelines: CPT4 Code Description Modifier Quantity [id number] [place] VISIT-LEV 3 EST PT 1 Physician Procedures CPT4 Code Description: [id number] - WC PHYS LEVEL 3 - EST PT ICD-10 Diagnosis Description  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

## 2020-04-15 ENCOUNTER — Other Ambulatory Visit: Payer: Self-pay

## 2020-04-15 ENCOUNTER — Encounter (HOSPITAL_BASED_OUTPATIENT_CLINIC_OR_DEPARTMENT_OTHER): Payer: Medicare Other | Admitting: Internal Medicine

## 2020-04-15 DIAGNOSIS — E11621 Type 2 diabetes mellitus with foot ulcer: Secondary | ICD-10-CM | POA: Diagnosis not present

## 2020-04-15 NOTE — Progress Notes (Signed)
[name], [name] ([id number]) Visit Report for 04/15/2020 HPI Details Patient Name: Date of Service: [name], [name] 04/15/2020 2:00 PM Medical Record [id number] Patient Account Number: [id number] Date of Birth/Sex: Treating RN: Feb 17, 1951 (69 y.o. [name]) Carlene Coria Primary Care Provider: [name] Other Clinician: Referring Provider: Treating Provider/Extender:Robson, Jeronimo Greaves, Buena Irish in Treatment: 63 History of Present Illness Location: Patient presents with a wound to bilateral feet. Quality: Patient reports experiencing essentially no pain. Severity: Mildly severe wound with no evidence of infection Duration: Patient has had the wound for greater than 2 weeks prior to presenting for treatment HPI Description: The patient is a pleasant 69 yrs old bm here for evaluation of ulcers on the plantar aspect of both feet. 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 He has a left arm graft for access. He has been trying to stay off his feet for weeks but does not seem to have any improvement in the wound. He has been seeing someone at the foot center and was referred to the Wound Care center for further evaluation. 12/30/15 the patient has 3 wounds one over the right first metatarsal head and 2 on the left foot at the left fifth and lleft first metatarsal head. All of these look relatively similar. The one over the left fifth probe to bone I could not prove that any of the others did. I note his MRI in November that did not show osteomyelitis. His peripheral pulses seem robust. All of these underwent surgical debridement to remove callus nonviable skin and subcutaneous tissue 01/06/16; the patient had his sutures removed from the right fifth ray amputation. There may be a small open part of this superiorly but otherwise the incision looks good. Areas over his right first, left first and left fifth metatarsal head all  underwent surgical debridement as a varying degree of callus, skin and nonviable subcutaneous tissue. The area that is most worrisome is the right fifth metatarsal head which has a wound probes precariously close to bone. There is no purulent drainage or erythema 01/13/16; I'm not exactly sure of the status of the right fifth ray amputation site however he follows with Dr. [name] later this week. The area over his right first plantar metatarsal head, left first and fifth plantar metatarsal head are all in the same status. Thick circumferential callus, nonviable subcutaneous tissue. Culture of the left fifth did not culture last week 01/20/16; all of the patient's wounds appear and roughly the same state although his amputation site on the right lateral foot looks better. His wounds over the right first, left first and left fifth metatarsal heads all underwent difficult surgical debridement removing circumferential callus nonviable skin and subcutaneous tissue. There is no overt evidence of infection in these areas. MRI at the end of December of the left foot did not show osteomyelitis, right foot showed osteomyelitis of the right fifth digit he is now status post amputation. 01/27/16 the patient's wounds over his plantar first and fifth metatarsal heads on the left all appear better having been started on a total contact cast last week. They were debridement of circumferential callus and nonviable subcutaneous tissue as was the wound over the first metatarsal head. His surgical incision on the right also had some light surface debridement done. 03/23/2016 -- the patient was doing really well and most of his wounds had almost completely healed but now he came back today with a history of having a discharge from  the area of his right foot on the plantar aspect and also between his first and second toe. He also has had some discharge from the left foot. Addendum: I spoke to the PA [name] at  the dialysis center whose fax number is [phone]. We discussed the infection the patient has and she will put the patient on vancomycin and Fortaz until the final culture report is back. We will fax this as soon as available. 03/30/2016 -- left foot x-ray IMPRESSION:No definitive osteomyelitis noted. X-ray of the right foot -- IMPRESSION: 1. Soft tissue swelling. Prior amputation right fifth digit. No acute or focal bony abnormality identified. If osteomyelitis remains a clinical concern, MRI can be obtained. 2. Peripheral vascular disease. His culture reports have grown an MSSA -- and we will fax this report to his hemodialysis center. He was called in a prescription of oral doxycycline but I have told her not to fill this in as he is already on IV antibiotics. 04/06/2016 -- a few days ago, I spoke to the hemodialysis center nurse who had stopped the IV antibiotics and he was given a prescription for doxycycline 100 mg by mouth twice a day for a week and he is on this at the present time. 05/11/2016 -- he has recently seen his PCP this week and his hemoglobin A1c was 7. He is working on his paperwork to get his orthotic shoes. 05/25/2016 -- -- x-ray of the left foot IMPRESSION:No acute bony abnormality. No radiographic changes of acute osteomyelitis. No change since prior study. X-ray of the right foot -- IMPRESSION: Postsurgical changes are seen involving the fifth toe. No evidence of acute osteomyelitis. he developed a large blister on the medial part of his right foot and this opened out and drain fluid. 06/01/2016 -- he has his MRI to be done this afternoon. 06/15/2016 -- MRI of the left forefoot without contrast shows 2 separate regions of cutaneous and subcutaneous edema and possible ulceration and blistering along the ball of the foot. No obvious osteomyelitis identified. MRI of the right foot showed cutaneous and subcutaneous thickening plantar to the first digit sesamoid with  an ulcer crater but no underlying osteomyelitis is identified. 06/22/2016 -- the right foot plantar ulcer has been draining a lot of seropurulent material for the last few days. 06/30/2016 -- spoke to the dialysis center and I believe I spoke to [place] a PA at the center who discussed with me and agreed to putting Legrand Como on vancomycin until his cultures arrive. On review of his culture report no WBCs were seen or no organisms were seen and the culture was reincubated for better growth. The final report is back and there were no predominant growth including Streptococcus or Staphylococcus. Clinically though he has a lot of drainage from both wounds a lot of undermining and there is further blebs on the left foot towards the interspace between his first and second toe. 08/10/2016 -- the culture from the right foot showed normal skin flora and there was no Staphylococcus aureus was group A streptococcus isolated. 09/21/2016 -- -- MRI of the right foot was done on 09/13/2016 - IMPRESSION: Findings most consistent with acute osteomyelitis throughout the great toe, sesamoid bones and plantar aspect of the head of the first metatarsal. Fluid in the sheath of the flexor tendon of the great toe could be sympathetic but is worrisome for septic tenosynovitis. First MTP joint effusion worrisome for septic joint. He was admitted to the hospital on 09/11/2016 and treated for  a fever with vancomycin and cefepime. He was seen by Dr. Bobby Rumpf of infectious disease who recommended 6 weeks treatment with vancomycin and ceftazidime with his hemodialysis and to continue to see as in the wound clinic. Vascular consult was pending. The patient was discharged home on 09/14/2016 and he would continue with IV antibiotics for 6 weeks. 09/28/16 wound appears reasonably healthy. Continuing with total contact cast 10/05/16 wound is smaller and looking healthy. Continue with total contact cast. He continues on IV  antibiotics 10/12/2016 -- he has developed a new wound on the dorsal aspect of his left big toe and this is a superficial injury with no surrounding cellulitis. He has completed 30 days of IV antibiotics and is now ready to start his hyperbaric oxygen therapy as per his insurance company's recommendation. 10/19/2016 -- after the cast was removed on the right side he has got good resolution of his ulceration on the right plantar foot. he has a new wound on the left plantar foot in the region of his fourth metatarsal and this will need sharp debridement. 10/26/2016 -- Xray of the right foot complete: IMPRESSION: Changes consistent with osteomyelitis involving the head of the first metatarsal and base of the first proximal phalanx. The sesamoid bones are also likely involved given their positioning. 11/02/2016 -- there still awaiting insurance clearance for his hyperbaric oxygen therapy and hopefully he will begin treatment soon. 11/09/2016 -- started with hyperbaric oxygen therapy and had some barotrauma to the right ear and this was seen by ENT who was prescribed Afrin drops and would probably continue with HBO and he is scheduled for myringotomy tubes on Friday 11/16/2016 -- he had his myringotomy tubes placed on Friday and has been doing much better after that with some fluid draining out after hyperbaric treatment today. The pain was much better. 11/30/2016 -- over the last 2 days he noticed a swelling and change of color of his right second toe and this had been draining minimal fluid. 12/07/2016 -- x-ray of the right foot -- IMPRESSION:1. Progressive ulceration at the distal aspect of the second digit with significant soft tissue swelling and osseous changes in the distal phalanx compatible with osteomyelitis. 2. Chronic osteomyelitis at the first MTP joint. 3. Ulcerations at the second and third toes as well without definite osseous changes. Osteomyelitis is not excluded. 4. Fifth digit  amputation. On 12/01/2016 I spoke to Dr. Bobby Rumpf, the infectious disease specialist, who kindly agreed to treat this with IV antibiotics and he would call in the order to the dialysis center and this has been discussed in detail with the patient who will make the appropriate arrangements. The patient will also book an appointment as soon as possible to see Dr. [name] in the office. Of note the patient has not been on antibiotics this entire week as the dialysis center did not receive any orders from Dr. [name]. I got in touch with Dr. [name] who tells me the patient has an appointment to see him this coming Wednesday. 12/14/2016 -- the patient is on ceftazidime and vancomycin during his dialysis, and I understand this was put on by his nephrologist Dr. Raliegh Ip possibly after speaking with infectious disease Dr. [name] 12/23/16; patient was on my schedule today for a wound evaluation as he had difficulties with his schedule earlier this week. I note that he is on vancomycin and ceftazidine at dialysis. In spite of this he arrives today with a new wound on the base of the right second toe  this easily probes to bone. The known wound at the tip of the second toe With this area. He also has a superficial area on the medial aspect of the third toe on the side of the DIP. This does not appear to have much depth. The area on the plantar left foot is a deep area but did not probe the bone 12/28/16 -- he has brought in some lab work and the most recent labs done showed a hemoglobin of 12.1 hematocrit of 36.3, neutrophils of 51% WBC count of 5.6, BN of 53, albumin of 4.1 globulin of 3.7, vancomycin 13 g per mL. 01/04/2017 -- he saw Dr. [name] who has recommended a amputation of the right second toe and he is awaiting this date. He sees Dr. Bobby Rumpf of infectious disease tomorrow. The bone culture taken on 12/28/2016 had no growth in 2 days. 01/11/2017 -- was seen by Dr. Bobby Rumpf regarding  the management and has recommended a eval by vascular surgeons. He recommended to continue the antibiotics during his hemodialysis. Xray of the left foot -- IMPRESSION: No acute fracture, dislocation, or osseous erosion identified. 01/18/2017 -- he has his vascular workup later today and he is going to have his right foot second toe amputation this coming Friday by Dr. [name]. We have also put in for a another 30 treatments with hyperbaric oxygen therapy 01/25/2017 -- I have reviewed Dr. Donnetta Hutching is vascular report from last week where he reviewed him and thought his vascular function was good enough to heal his amputation site and no further tests were recommended. He also had his orthopedic related to surgery which is still pending the notes and we will review these next week. 02/01/2017 -- the operative remote of Dr. 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 02/08/2017 --he was seen by Dr. [name] on 02/03/2017 -- patient is on [place]. after a thorough review he had recommended to continue with antibiotics during hemodialysis. The patient was seen by Dr. [name], but I do not find any follow-up note on the electronic medical record. he has removed the dressing over the right foot to remove the sutures and asked him to see him back in a month's time 02/22/2017 -- patient has been febrile and has been having symptoms of the upper respiratory tract infection but has not been checked for the flu and it's been over 4 days now. He says he is feeling better today. Some drainage between his left first and second toe and this needed to be  looked at 03/08/2017 -- was seen by Dr. Bobby Rumpf on 03/07/2017 after review he will stop his antibiotics and see him back in a month to see if he is feeling well. 03/15/2017 -- he has completed his course of hyperbaric oxygen therapy and is doing fine with his health otherwise. 03/22/2017 -- his nutritionist at the dialysis center has recommended a protein supplement to help build his collagen and we will prescribe this for him when he has the details 05/03/2017 -- he recently noticed the area on the plantar aspect of his fifth metatarsal head which opened out and had minimal drainage. 05/10/2017 -- -- x-ray of the left foot --  IMPRESSION:1. No convincing conventional radiographic evidence of active osteomyelitis.2. Active soft tissue ulceration at the tip of the fifth digit, and at the lateral aspect of the foot adjacent the base of the fifth metatarsal. 3. Surgical changes of prior fourth toe amputation. 4. 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 5. Small vessel atherosclerotic vascular calcifications. 6. 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 ======= Readmission after 5 weeks: 06/15/2017 -- the patient returns after 5 weeks having had an MRI done on 05/17/2017 MRI of the left foot without contrast showed soft tissue ulcer overlying the base of the fifth metatarsal. 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 No drainable fluid collection to suggest an abscess. He was in hospital between 05/16/2017 and 05/25/2017 -- and on discharge was asked to follow-up with Dr. [name] and Dr. [name]. He was treated 2 weeks post discharge with vancomycin plus ceftriaxone with hemodialysis and oral metronidazole 500 mg 3 times a day. 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 last hemoglobin  A1c was 6.7% He had a postoperative MR of the foot on 05/23/2017 -- which showed no new areas of cortical bone loss, edema or soft tissue ulceration to suggest osteomyelitis. the patient has completed his course of IV antibiotics during dialysis and is to see the infectious disease doctor tomorrow. Dr. [name] had seen him after suture removal and asked him to keep the wound with a dry dressing 06/21/2017 -- he was seen by Dr. Bobby Rumpf of infectious disease on 06/16/2017 -- he stopped his Flagyl and will see him back in 6 weeks. He was asked to follow-up with Dr. [name] and with the wound clinic clinic. 07/05/17; bone biopsy from last time showed acute osteomyelitis. This is from the reminiscent in the left fifth metatarsal. Culture result is apparently still pending [holding for anaerobe}. From my understanding in this case this is been a progressive necrotic wound which is deteriorated markedly over the last 3 weeks since he returned here. He now has a large area of exposed bone which was biopsied and cultured last week. Dr. Graylon Good has put him on vancomycin and Fortaz during his hemodialysis and Flagyl orally. He is to see Dr. [name] next week 07/19/2017 -- the patient was reviewed by Dr. [name] of orthopedics who reviewed the case in detail and agreed with the plan to continue with IV antibiotics, aggressive wound care and hyperbaric oxygen therapy. He would see him back in 3 weeks' time 08/09/2017 -- saw Dr. Bobby Rumpf on 08/03/2017 -he was restarted on his antibiotics for 6 weeks which included vanco, ceftaz and Flagyl. he recommended continuing his antibiotics for 6 weeks and reevaluate his completion date. He would continue with wound care and hyperbaric oxygen therapy. 08/16/2017 -- I understand he will be completing his 6 weeks of antibiotics sometime later this week. 09/19/2017 -- he has been without his wound VAC for the last week due to lack of supplies. He has been  packing his wound with silver alginate. 10/04/2017 -- he has an appointment with Dr. [name] tomorrow and hence we will not apply the wound VAC after his dressing changes today. 10/11/2017 -- he was seen by Dr. Bobby Rumpf on 10/05/2017, noted that the patient was currently off antibiotics, and after thorough review he recommended he follow-up with the wound care as per plan and no  further antibiotics at this point. 11/29/17; patient is arrived for the wound on his left lateral foot.Marland Kitchen He is completed IV antibiotics and 2 rounds of hyperbaric oxygen for treatment of underlying osteomyelitis. He arrives today with a surface on most of the wound area however our intake nurse noted drainage from the superior aspect. This was brought to my attention. 12/13/2017 -- the right plantar foot had a large bleb and once the bleb was opened out a large callus and subcutaneous debris was removed and he has a plantar ulcer near the fourth metatarsal head 12/28/17 on evaluation today patient appears to be doing acutely worse in regard to his left foot. The wound which has been appearing to do better as now open up more deeply there is bone palpable at the base of the wound unfortunately. He tells me that this feels "like it did when he had osteomyelitis previously" he also noted that his second toe on the left foot appears to be doing worse and is swollen there does appear to be some fluid collected underneath. His right foot plantar ulcer appears to be doing somewhat better at this point and there really is no complication at the site currently. No fevers, chills, nausea, or vomiting noted at this time. Patient states that he normally has no pain at this site however. Today he is having significant pain. 01/03/18; culture done last week showed methicillin sensitive staph aureus and group B strep. He was on Septra however he arrives today with a fever of 101. He has a functional dialysis shunt in his left arm but  has had no pain here. No cough. He still makes some urine no dysuria he did have abdominal pain nausea and diarrhea over the weekend but is not had any diarrhea since yesterday. Otherwise he has no specific complaints 01/05/18; the patient returns today in follow-up for his presentation from 1/8. At that point he was febrile. I gave him some Levaquin adjusted for his dialysis status. He tells me the fever broke that night and it is not likely that this was actually a wound infection. He is gone on to have an MRI of the left foot; this showed interval development of an abnormal signal from the base of the third and fourth metatarsal and in the cuboid reminiscent consistent with osteomyelitis. There is no mention of the left second toe I think which was a concern when it was ordered. The patient is taking his Levaquin 01/10/18; the patient has completed his antibiotics today. The area on the left lateral foot is smaller but it still probes easily to bone. He has underlying osteomyelitis here and sees Dr. Megan Salon of infectious disease this week The area on the right third plantar metatarsal head still requires debridement not much change in dimensions He has a new wound on the medial tip of his right third toe again this probes to bone. He only noticed this 2 days ago 01/17/18; the patient saw Dr. [name] last week and he is back on Vanco and Fortaz at dialysis. 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 We are previously seeing him for a wound also on the base of the fourth med head on the right. X- ray of this area did not show osteomyelitis in relation to a new wound on the tip of his right third toe. Again this probes to bone. Finally his  second toe on the left which didn't really show anything on the MRI at least no report appears to have a separated cutaneous area which I think is  going to come right off the tip of his toe and leave exposed bone. Whether this is infectious or ischemic I am not clear Culture I did from the third toe last week which was his new wound was negative. Plain x-ray on the right foot did not show osteomyelitis in the toes 01/24/18; the patient is going went to see Dr. [name]. He is going to have a amputation of the left second and right third toe although paradoxically the left second toe looks better than last week. We have treating a deep probing wound on the left lateral foot and the area over the third metatarsal head on the right foot. 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 Also a debridement including bone of the left lateral foot and a closure which is still sutured. This is a bit surprising. Also apparent debridement of the base of the right third toe wound. 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 I'm not sure exactly where we are in the frame of things here. The wound on the left lateral foot is still sutured also the amputation sites of the left second and right third toe. He is been using Xeroform to the sutured areas silver alginate to the right foot 02/14/18; he continues on Rocephin at dialysis for osteomyelitis. I still don't have a good sense of where we are in the treatment duration. The wounds on the left lateral foot had the sutures removed and this clearly still probes deeply. We debrided the area with a #5 curet. We'll use silver alginate to the wound the area over the right third met head also required debridement of callus skin and subcutaneous tissue and necrotic debris over the wound surface. This tunnels superiorly but I did not unroofed this today. The areas on the tip of his toes have a  crusted surface eschar I did not debridement this either. 02/21/18; he continues on Rocephin at dialysis for osteomyelitis. I don't have a good sense of where we are and the treatment duration. The wounds on the left lateral foot is callused over on presentation but requires debridement. The area on the right third med head plantar aspect actually is measuring smaller 02/28/18;patient is on Vanco and Fortaz at dialysis, I'm not sure why I thought this was Rocephin above unless it is been changed. I don't have a good sense of time frame here. Using silver collagen to the area on the left lateral foot and right third med head plantar foot 03/07/18; patient is completing bank and Fortaz at dialysis soon. He is using Silver collagen to the area on the left lateral foot and the right third metatarsal head. He has a smaller superficial wound distal to the left lateral foot wound. 03/14/18-he is here in follow-up evaluation for multiple ulcerations to his bilateral feet. He presents with a new ulceration to the plantar aspect of the left foot underneath the blister, this was deroofed to reveal a partial thickness ulcer. He is voicing no complaints or concerns, tolerated dialysis yesterday. We will continue with same treatment plan and he will follow-up next week 03/21/18;  the patient has a area on the lateral left foot which still has a small probing area. The overall surface area of the wound is better. He presented with a new ulceration on the left foot plantar fifth metatarsal head last week. The area on the right third metatarsal head appears smaller.using silver alginate to all wound areas. The patient is changing the dressing himself. He is using a [place] forefoot offloading are on the right and a healing sandal on the left 03/28/18; original wound left lateral foot. Still nowhere close to looking like a heeling surface secondary wound on the left lateral foot at the base of his question fifth metatarsal  which was new last week Right third metatarsal head. We have been using silver alginate all wounds 04/04/18; the original wound on the left lateral foot again heavy callus surrounding thick nonviable subcutaneous tissue all requiring debridement. The new wound from 2 weeks ago just near this at the base of the fifth metatarsal head still looks about the same Unfortunately there is been deterioration in the third medical head wound on the right which now probes to bone. I must say this was a superficial wound at one point in time and I really don't have a good frame of reference year. I'll have to go back to look through records about what we know about the right foot. 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 He is also been to see Dr. [name]. Somewhere in here as somebody is ordered a VASCULAR evaluation which was done on 03/22/18. On the right is anterior tibial artery was monophasic triphasic at the posterior tibial artery. On the left anterior tibial artery monophasic posterior tibial artery monophasic. ABI in the right was 1.43 on the left 1.21. TBIs on the right at 0.41 and on the left at 0.32. A vascular consult was recommended and I think has been arranged. 04/11/18; Mr. Daywalt has not had an MRI of the right foot since 2017. Recent x-ray of the right foot done in January and February was negative. However he has had a major deterioration in the wound over the third met head. He is completed antibiotics last week at dialysis [place]. I think he is going to see vascular in follow-up. The areas on the left lateral foot and left plantar fifth metatarsal head both look satisfactory. I debrided both of these areas although the tissue here looks good. The area on the left lateral foot once probe to bone it certainly does not do that now 04/18/18; MRI of the right foot is on Thursday. He has  what looks to be serosanguineous purulent drainage coming out of the wound over the right third metatarsal head today. He completed antibiotics bank and Fortaz 2 weeks ago at dialysis. He has a vascular follow-up with regards to his arterial insufficiency although I don't exactly see when that is booked. The areas on the left foot including the lateral left foot and the plantar left fifth metatarsal head look about the same. 04/25/18-He is here in follow-up evaluation for bilateral foot ulcers. MRI obtained was negative for osteomyelitis. Wound culture was negative. We will continue with same treatment plan he'll follow-up next week 05/02/18; the right plantar foot wound over the third metatarsal head actually looks better than when I last saw this. His MRI was negative for osteomyelitis wound culture was negative. On the left plantar foot both wounds on  the plantar fifth metatarsal head and on the lateral foot both are covered in a very hard circumferential callus. 05/09/18; right plantar foot wound over the third metatarsal head stable from last week. Left plantar foot wound over the fifth metatarsal head also stable but with callus around both wound areas The area on the left lateral foot had thick callus over a surface and I had some thoughts about leaving this intact however it felt boggy. We've been using silver alginate all wounds 05/16/18; since the patient was last here he was hospitalized from 5/15 through 5/19. 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 This grew MRSA. He was placed on vancomycin.MRI of the left foot suggested "progressive" osteomyelitis and bone destruction of the cuboid and the base of the fifth metatarsal. Stable erosive changes of the base of the second metatarsal. I'm wondering if they're aware that he had surgery and debridement in the area of the underlying bone  previously by Dr. [name]. In any case, He was also revascularized with an angioplasty and stenting of the left tibial peroneal trunk and angioplasty of the left posterior tibial artery. He is on vancomycin at dialysis. He has a 2 week follow-up with infectious disease. He has vascular surgery follow-up. He was started on Plavix. 05/23/18; the patient's wound on the lateral left foot at the level of the fifth metatarsal head and the plantar wound on the plantar fifth metatarsal head both look better. The area on the right third metatarsal head still has depth and undermining. We've been using silver alginate. He is on vancomycin. He has been revascularized on the left 05/30/18; he continues on vancomycin at dialysis. Revascularized on the left. The area on the left lateral foot is just about closed. Unfortunately the area over the plantar fifth metatarsal head undermining superiorly as does the area over the right third metatarsal head. Both of these significantly deteriorated from last week. He has appointments next week with infectious disease and orthopedics I delayed putting on a cast on the left until those appointments which therefore we made we'll bring him in on Friday the 14th with the idea of a cast on the left foot 06/06/18; he continues on vancomycin at dialysis. Revascularized on the left. He arrives today with a ballotable swelling just above the area on the left lateral foot where his previous wound was. He has no pain but he is reasonably insensate. The difficult areas on the plantar left fifth metatarsal head looks stable whereas the area on the right third plantar metatarsal head about the same as last week there is undermining here although I did not unroofed this today. He required a considerable debridement of the swelling on the left lateral foot area and I unroofed an abscess with a copious amount of brown purulent material which I obtained for culture. I don't believe during his  recent hospitalization he had any further imaging although I need to review this. Previous cultures from this area done in this clinic showed MRSA, I would be surprised if this is not what this is currently even though he is on vancomycin. 06/13/18; he continues on vancomycin at dialysis however he finishes this on Sunday and then graduates the doxycycline previously prescribed by Dr. [name]. The abscess site on the lateral foot that I unroofed last time grew moderate amounts of methicillin-resistant staph aureus that is both vancomycin and tetracycline sensitive  so we should be okay from that regard. He arrives today in clinic with a connection between the abscess site and the area on the lateral foot. We've been using silver alginate to all his wound areas. The right third metatarsal head wound is measuring smaller. Using silver alginate on both wound areas 06/20/18; transitioning to doxycycline prescribed by Dr. [name]. The abscess site on the lateral foot that I unroofed 2 weeks ago grew MRSA. That area has largely closed down although the lateral part of his wound on the fifth metatarsal head still probes to bone. I suspect all of this was connected. On the right third metatarsal head smaller looking wound but surrounded by nonviable tissue that once again requires debridement using silver alginate on both areas 06/27/18; on doxycycline 100 twice a day. The abscess on the lateral foot has closed down. Although he still has the wound on the left fifth metatarsal head that extends towards the lateral part of the foot. The most lateral part of this wound probes to bone Right third metatarsal head still a deep probing wound with undermining. Nevertheless I elected not to debridement this this week If everything is copious static next week on the left I'm going to attempt a total contact cast 07/04/18; he is tolerating his doxycycline. The abscess on the left lateral foot is closed down although  there is still a deep wound here that probes to bone. We will use silver collagen under the total contact cast Silver collagen to the deep wound over the right third metatarsal head is well 07/11/18; he is tolerating doxycycline. The left lateral fifth plantar metatarsal head still probes deeply but I could not probe any bone. We've been using silver collagen and this will be the second week under the total contact cast Silver collagen to deep wound over the right third metatarsal head as well 07/18/18; he is still taking doxycycline. The left lateral fifth plantar metatarsal this week probes deeply with a large amount of exposed bone. Quite a deterioration. He required extensive debridement. Specimens of bone for pathology and CNS obtained Also considerable debridement on the right third metatarsal head 07/25/18; bone for pathology last week from the lateral left foot showed osteomyelitis. CandS of the bone Enterobacter. And Klebsiella. He is now on ciprofloxacin and the doxycycline is stopped [Dr. [name] of infectious disease] area He has an appointment with Dr. [name] tomorrow I'm going to leave the total contact cast off for this week. May wish to try to reapply that next week or the week after depending on the wound bed looks. I'm not sure if there is an operative option here, previously is followed with Dr. [name] 08/01/18 on evaluation today patient presents for reevaluation. He has been seen by infectious disease, Dr. [name], and he has placed the patient back on Ceftaz currently to be given to him at dialysis three times a week. 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 Fortunately there does not appear to be the evidence of infection at this point in time. Overall the patient has been tolerating the dressing changes without complication. We are no longer due lives in the cast of left foot secondary to the infection  obviously. 08/10/18 on evaluation today patient appears to be doing rather well all things considered in regard to his left plantar foot ulcer. He does have a significant callous on the lateral portion of his left foot he wonders if  I can help clean that away to some degree today. Fortunately he is having no evidence of infection at this time which is good news. No fevers chills noted. With that being said his right plantar foot actually does have a significant callous buildup around the wound opening this seems to not be doing as well as I would like at this point. I do believe that he would benefit from sharp debridement at this site. Subsequently I think a total contact cast would be helpful for the right foot as well. 08/17/18; the patient arrived today with a total contact cast on the right foot. This actually looks quite good. He had gone to see Dr. Megan Salon of infectious disease about the osteomyelitis on the left foot. I think he is on IV Fortaz at dialysis although I am not exactly sure of the rationale for the Fortaz at the time of this dictation. He also arrived in today with a swelling on the lateral left foot this is the site of his original wounds in fact when I first saw this man when he was under Dr. Ardeen Garland care I think this was the site of where the wound was located. It was not particularly tender however using a small scalpel I opened it to [place] some moderate amount of purulent drainage. 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 Last MRI was in May that showed progressive osteomyelitis and bone destruction of the cuboid and base of the fifth metatarsal. Last week he had an abscess over this same area this was removed. Culture was negative. We are continuing with a total contact cast to the area on the third metatarsal head on the right and making some  good progress here. The patient asked me again about renal transplant. He is not on the list because of the open wounds. 08/31/18; apparently the patient had an interruption in the [place] but he is now back on this at dialysis. Apparently there was a misunderstanding and Dr. Crissie Figures orders. Due to have the MRI of the left foot tonight. The area on the right foot continues to have callus thick skin and subcutaneous tissue around the wound edge that requires constant debridement however the wound is smaller we are using a total contact cast in this area. Alginate all wounds 09/07/18; without much surprise the MRI of his left foot showed osteomyelitis in the reminiscent of his fourth metatarsal but also the third metatarsal. She arrives in clinic today with the right foot and a total contact cast. There was purulent drainage coming out of the wound which I have cultured. Marked deterioration here with undermining widely around the wound orifice. Using pickups and a scalpel I remove callus and subcutaneous tissue from a substantial new opening. In a similar fashion the area on the left lateral foot that was blistered last week I have opened this and remove skin and subcutaneous tissue from this area to expose a obvious new wound. I think there is extension and communication between all of this on the left foot. The patient is on Fortaz at dialysis. Culture done of the right foot. We are clearly not making progress here. We have made him an appointment with Dr. [name] of orthopedics. I think an amputation of the left leg may be discussed. I don't think there is anything that can be done with foot salvage. 09/14/18; culture from the  right foot last time showed a very resistant MRSA. This is resistant to doxycycline. I'm going to try to get linezolid at least for a week. He does not have a appointment with Dr. [name] yet. 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 I'll send a message to Dr. [name] of infectious disease. I want the patient to see Dr. [name] to go over the pros and cons of an amputation which I think will be a BKA. The patient had a question about whether this is curative or not. I told him that I thought it would be although spread of staph aureus infection is not unheard of. MRI of the right foot was done at the end of April 2019 did not show osteomyelitis. The patient last saw Dr. [name] on 9/3. I'll send Dr. [name] a message. I would really like him to weigh the pros and cons of a BKA on the left otherwise he'll probably need IV antibiotics and perhaps hyperbaric oxygen again. He has not had a good response to this in the past. His MRI earlier this month showed progressive damage in the remnants of the fourth and third metatarsal 09/21/2018; sees Dr. [name] of orthopedics next week to discuss a left BKA in response to the osteomyelitis in the left foot. Using silver alginate to all wound areas. He is completing the Zyvox I did put in for him after last culture showed MRSA 09/29/2018; patient saw Dr. [name] of orthopedics discussed the osteomyelitis in the left foot third and fourth metatarsals. He did not recommend urgent surgery but certainly stated the only surgical option would be a BKA. He is communicating with Dr. [name]. The problem here is the instability of the areas on his foot which constantly generate draining abscesses. I will communicate with Dr. [name] about this. He has an appointment on 10/23 10/05/2018; patient sees Dr. [name] on 10/23. I have sent him a secure message not ordered any additional antibiotics for now. Patient continues to have 2 open areas on the left plantar foot at the fifth metatarsal head and the lateral aspect of the foot. These have not changed all that much. The area on the right third met head still has thick callus and surrounding subcutaneous tissue we have been using  silver alginate 10/12/2018; patient sees Dr. [name] or colleague on 10/23. 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 The area on the right plantar foot is just about closed. The superior wound on the left fifth metatarsal head is callused over but I am not sure if this is closed. The area below it is about the same. Considerable amount of callus on the lateral foot. We have been using silver alginate to the wounds 10/19/2018; the patient saw Dr. [name] on 10/22. He wishes to try and continue to save the left foot. He has been given 6 weeks of oral ciprofloxacin. We continue to put a total contact cast on the right foot. The area over the fifth metatarsal head on the left is callused over/perhaps healed but I did not remove the callus to find out. He still has the wound on the left lateral midfoot requiring debridement. We have been using silver alginate to all wound areas 10/26/2018 On the left foot our intake nurse noted some purulent drainage from the inferior wound [currently the only one that is not callused over] On the right  foot even though he is in total contact cast a considerable amount of thick black callus and surface eschar. On this side we have been using silver alginate under a total contact cast. he remains on ciprofloxacin as prescribed by Dr. [name] 11/02/2018; culture from last week which is done of the probing area on the left midfoot wound showed MRSA "few". I am going to need to contact Dr. [name] which I will do today I think he is going to need IV antibiotics again. The area on the left foot which was callused on the side is clearly separating today all of this was removed a copious amounts of callus and necrotic subcutaneous tissue. The area on the right plantar foot actually remained healthy looking with a healthy granulated base. We are using silver alginate to all wound areas 11/09/2018;  unfortunately neither 1 of the patient's wounds areas looks at all satisfactory. On the left he has considerable necrotic debris from the plantar wound laterally over the foot. I removed copious amounts of material including callus skin and subcutaneous tissue. On the right foot he arrives with undermining and frankly purulent drainage. Specimen obtained for culture and debridement of the callused skin and subcutaneous tissue from around the circumference. Because of this I cannot put him back in a total contact cast but to be truthful we are really unfortunately not making a lot of progress I did put in a secure text message to Dr. [name] wondering about the MRSA on the left foot. He suggested vancomycin although this is not started. I was left wondering if he expected me to send this into dialysis. Has we have more purulence on the right side wait for that result before calling dialysis 11/16/2018; I called dialysis earlier this week to get the vancomycin started. I think he got the first dose on Tuesday. The patient tells me that he fell earlier this week twisting his left foot and ankle and he is swelling. He continues to not look well. He had blood cultures done at dialysis on Tuesday he is not been informed of the results. 12/07/2018; the patient was admitted to hospital from 11/22/2018 through 12/02/2018. He underwent a left BKA. Apparently had staph sepsis. In the meantime being off the right foot this is closed over. He follows up with Dr. 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 Subsequent to this he actually had multiple falls in fact he tells me to following which calls the wound to be his apparently. He has been seeing Dr. [name] and his physician assistant in the interim. They actually did want him to come back for reevaluation to see if there's anything we can do for  me wound care perspective the help this area to heal more appropriately. The patient states that he does not have a tremendous amount of pain which is good news. No fevers, chills, nausea, or vomiting noted at this time. We have gotten approval from Dr. [name] office had [place] for [place] to treat the patient for his stop wound. This is due to the fact that the patient was in the 90 day postop global. 1/21; the patient does not have an open wound on the right foot although he does have some pressure areas that will need to be padded when he is transferring. The dehisced surgical wound looks clean although there is some undermining of 1.5 cm superiorly. We have been  using calcium alginate 1/28; patient arrives in our clinic for review of the left BKA stump wound. This appears healthy but there is undermining. TheraSkin #1 2/11; TheraSkin #2 2/25; TheraSkin #3. Wound is measuring smaller 3/10; TheraSkin #4 wound is measuring smaller 3/24; TheraSkin #5 wound is measuring smaller and looks healthy. 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 He has no open area on the right foot although he does have some callused area over the original third metatarsal head wound. The third metatarsal is also subluxed on the right foot. I have warned him today that he cannot consider wearing a prosthesis for at least a month but he can go for measurements. He is going to need to keep the right foot padded is much as possible in his diabetic shoe indefinitely READMISSION 06/12/2019 Mr. Pownall is a type II diabetic on dialysis. He has been in this clinic multiple times with wounds on his bilateral lower extremities. 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 He also has a history of wounds on his right foot but tells me that everything is going well here. He actually came in  here with his prosthesis walking with a cane. We were all really quite gratified to see this He states over the last several weeks he has had a painful area at the tip of the left third finger. His dialysis shunt in his is in the left upper arm and this particularly hurts during dialysis when his fingers get numb and there is a lot of pain in the tip of his left third finger. I believe he is seen vascular surgery. He had a Doppler done to evaluate for a dialysis steal syndrome. He is going for a banding procedure 1 week from today towards the left AV fistula. I think if that is unsuccessful they will be looking at creating a new shunt. He had a course of doxycycline when he which he finished about a week ago. He has been using Bactroban to the finger 6/25; the patient had his banding procedure and things seem to be going better. He is not having pain in his hand at dialysis. The area on the tip of his finger seems about closed. He did however have a paronychia I the last time I saw him. I have been advising him to use topical Bactroban washing the finger. Culture I did last time showed a few staph epidermidis which I was willing to dismiss as superficial skin contaminant. He arrives today with the finger wound looking better however the paronychia a seems worse 7/9; 2-week follow-up. He does not have an open wound on the tip of his third left finger. I thought he had an initial ischemic wound on the tip of the finger as well as a paronychia a medially. All of this appears to be closed. 8/6-Patient returns after 3 weeks for follow-up and has a right second met head plantar callus with a significant area of maceration hiding an ulcer ABI repeated today on right - 1.26 8/13-Patient returns at 1 week, the right second met head plantar wound appears to be slightly better, it is surrounded by the callus area we are using silver alginate 8/20; the patient came back to clinic 2 weeks ago with a wound on  the right second metatarsal head. He apparently told me he developed callus  in this area but also went away from his custom shoes to a pair of running shoes. Using silver alginate. 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 There might be much we can do to offload this although the patient tells me he has a wheelchair that he is using to try and stay off the wound is much as possible 8/27; right second metatarsal head. Still small punched out area with thick callus and subcutaneous tissue around the wound. We have been using silver collagen. This is always been an issue with this man's wound especially on this foot 9/3; right second met head. Still the same punched-out area with thick callus and subcutaneous tissue around the wound removing the circumference demonstrates repetitively undermining area. I have removed all the subcutaneous tissue associated with this. We have been using silver collagen 9/15; right second metatarsal head. Same punched-out thick callus and subcutaneous tissue around the wound area. Still requiring debridement we have been using silver collagen I changed back to silver alginate X-ray last time showed no evidence of osteomyelitis. Culture showed Klebsiella and he was started and Keflex apparently just 4 days ago 9/24; right second metatarsal head. He is completed the antibiotics I gave him for 10 days. Same punched-out thick callus and subcutaneous tissue around the area. Still requiring debridement. We have been using silver alginate. The area itself looks swollen and this could be just [place] that comes on this area with walking on a prosthesis on the left however I think he needs an MRI and I am going to order that today. There is not an option to offload this further 10/1; right second plantar metatarsal head. MRI booked for October 6. Generally looking better today using silver alginate 10/8; right second plantar metatarsal head. MRI that was  done on 10/6 was negative for osteomyelitis noted prior amputations of the second and fourth toes at the MTP. Prior amputation of the third toe to the base of the middle phalanx. Prior amputation of the fifth toe to the head of the metatarsal. 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 He does not recall about hearing about this. He did not have any pain but then again he has reduced sensation from diabetic neuropathy 10/15; right second plantar metatarsal head. Still in the same condition callus nonviable subcutaneous tissue which cleans up nicely with debridement but reforms by the next week. The patient tells me that he is offloading this is much as he can including taking his wheelchair to dialysis. We have been using silver alginate, changed to silver collagen today 10/22; right second plantar metatarsal head. Wound measures smaller but still thick callus around this wound. Still requiring debridement 11/5 right second plantar metatarsal head. Less callus around the wound circumference but still requiring debridement. There is still some undermining which I cleaned out the wound looks healthy but still considerable punched out depth with a relatively small circumference to the wound there is no palpable bone no purulent drainage 11/12; right second plantar metatarsal head. Small wound with thick callus tissue around the wound and significant undermining. We have been using silver collagen after my continuous debridements of this area 11/19; patient arrives in today with purulent drainage coming out of the wound in the second third met head area on the right foot. Serosanguineous thick drainage. Specimen obtained for culture. 11/21/2019 on evaluation today patient appears to be doing well with regard to his foot  ulcer. The good news is is culture came back negative for any bacteria there was no growth. The other good news is his x-ray also  appears to be doing well. The foot is also measuring much better than what it was. Overall I am very pleased with how things seem to be progressing. 12/22; patient was in [facility] for several weeks due to the death of a family member. He said he stayed off the wound using silver alginate. 01/03/2020. Patient has a small open area with roughly 0.3 cm of circumferential undermining. The orifice looks smaller. We have been using silver collagen. There is not a wound more aggressive way to offload this area as he has a prosthesis on the other leg 1/21; again the same small area is 2 weeks ago. We have been using silver collagen I change that to endoform today I really believe that this is an offloading issue 2/4; the area on the right third met head. We have been using endoform. 2/11 the area on the right third met head we have been using silver alginate. 2/25; the area on the right third met head. There is much less callus on this today. The patient states he is offloading this even more aggressively. As an example he is taking his wheelchair into dialysis on most days 3/4; right third metatarsal head. Again he has eschar over the surface of the wound which I removed with a #3 curette. Some subcutaneous debris. This has 0.8 cm of direct probing depth. I cannot feel any bone here. I did do a culture the area 3/11; right third metatarsal head. Again an eschared area over the wound which almost makes this look superficial. Again debridement reveals a wound with probing depth probably not much different from last week there is no palpable bone no palpable purulence. The patient tells me that he is making every effort to offload this area properly. He is taking wheelchair into dialysis etc. There is no option for forefoot offloading or a total contact cast. We used Oasis #1 today 3/18; again callus and eschar over the wound surface. I remove this but still a probing wound although only 3 mm  in depth this time. This is an improvement 3/25; again callus and eschar over the wound surface which I removed the wound is much deeper today at 0.7 cm. There is no palpable bone. Because of the appearance of this a culture was done. I am not going to put the Oasis back in this. Concerned about underlying infection. Notable for the fact that he is just finished doxycycline for the last go round of this 3/30; still 0.7 cm in depth which is disappointing. Culture I gave last time showed a few Staph aureus which is methicillin susceptible. This should have been taken care of by the recent course of doxycycline I gave him. We are using silver alginate 4/6; almost 1 cm in depth. We have been using silver alginate with underlying Bactroban. 4/15; about 1 cm in depth still. There is no palpable bone but there is undermining thick surface again as usual. Our intake nurse noted what looked to be purulent drainage I suppose that could have been Bactroban but I did a culture for CandS 4/20; depth down 2.7 cm. Culture from last time was negative we have been using Bactroban and silver alginate Electronic Signature(s) Signed: 04/15/2020 5:47:38 PM By: Linton Ham MD Entered By: Linton Ham on 04/15/2020 15:24:03 -------------------------------------------------------------------------------- Physical Exam Details Patient Name: Date of Service: Bilton,  Mahmood B. 04/15/2020 2:00 PM Medical Record [id number] Patient Account Number: [id number] Date of Birth/Sex: Treating RN: 1951/10/17 (69 y.o. [name]) Carlene Coria Primary Care Provider: [name] Other Clinician: Referring Provider: Treating Provider/Extender:Robson, Jeronimo Greaves, Buena Irish in Treatment: 47 Constitutional Patient is hypotensive. However appears well. This is not unusual for him. Respirations regular, non-labored and within target range.. Patient is febrile today. Mild fever. Appears in no distress. Notes Wound exam;  still a small hole in the middle of the right second metatarsal head. There is much less callus which suggest that he is staying off this. As near as I can tell there is not a lot of undermining here. But I can see looks stable in terms of the wound bed. Is no evidence of surrounding infection Electronic Signature(s) Signed: 04/15/2020 5:47:38 PM By: Linton Ham MD Entered By: Linton Ham on 04/15/2020 15:25:26 -------------------------------------------------------------------------------- Physician Orders Details Patient Name: Date of Service: ELEFTHERIOS, DUDENHOEFFER 04/15/2020 2:00 PM Medical Record [id number] Patient Account Number: [id number] Date of Birth/Sex: Treating RN: [birth year] (69 y.o. 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 - Next week. Dressing Change Frequency Wound #41 Right Metatarsal head second Change dressing every day. Wound Cleansing May shower and wash wound with soap and water. Primary Wound Dressing Wound #41 Right Metatarsal head second Calcium Alginate with Silver - apply thin layer of bactroban then pack calcium alginate into wound bed. Secondary Dressing Wound #41 Right Metatarsal head second Foam - foam donut Kerlix/Rolled Gauze Dry Gauze Other: - felt to periwound and place one below wound. lightly wrap coban to hold dressing in place. felt / pad wound Edema Control Avoid standing for long periods of time Elevate legs to the level  of the heart or above for 30 minutes daily and/or when sitting, a frequency of: - throughout the day. Off-Loading Other: - felt to periwound and place one below wound. Additional Orders / Instructions Other: - minimize walking and standing on foot to aid in wound healing and callus buildup. Electronic Signature(s) Signed: 04/15/2020 5:39:56 PM By: Carlene Coria RN Signed: 04/15/2020 5:47:38 PM By: Linton Ham MD Entered By: Carlene Coria on 04/15/2020 14:28:36 -------------------------------------------------------------------------------- Problem List Details Patient Name: Date of Service: XAVIER, MUNGER 04/15/2020 2:00 PM Medical Record [id number] Patient Account Number: [id number] Date of Birth/Sex: Treating RN: 1951-09-11 (69 y.o. 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, 06/12/2019 No Yes other extremity L03.115 Cellulitis of right lower limb 11/15/2019 No Yes Inactive Problems ICD-10 Code Description Active Date Inactive Date L03.114 Cellulitis of left upper limb 06/12/2019 06/12/2019 L98.498 Non-pressure chronic ulcer of skin of other sites with other 06/12/2019 06/12/2019 specified severity S61.209D Unspecified open wound of unspecified finger without damage 06/12/2019 06/12/2019 to nail, subsequent encounter Resolved Problems Electronic  Signature(s) Signed: 04/15/2020 5:47:38 PM By: Linton Ham MD Entered By: Linton Ham on 04/15/2020  15:22:53 -------------------------------------------------------------------------------- Progress Note Details Patient Name: Date of Service: DARROLD, BEZEK 04/15/2020 2:00 PM Medical Record [id number] Patient Account Number: [id number] Date of Birth/Sex: Treating RN: [birth year] (69 y.o. [name]) Carlene Coria Primary Care Provider: [name] Other Clinician: Referring Provider: Treating Provider/Extender:Robson, Jeronimo Greaves, Buena Irish in Treatment: 8 Subjective History of Present Illness (HPI) The following HPI elements were documented for the patient's wound: Location: Patient presents with a wound to bilateral feet. Quality: Patient reports experiencing essentially no pain. Severity: Mildly severe wound with no evidence of infection Duration: Patient has had the wound for greater than 2 weeks prior to presenting for treatment The patient is a pleasant 69 yrs old bm here for evaluation of ulcers on the plantar aspect of both feet. 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 He has a left arm graft for access. He has been trying to stay off his feet for weeks but does not seem to have any improvement in the wound. He has been seeing someone at the foot center and was referred to the Wound Care center for further evaluation. 12/30/15 the patient has 3 wounds one over the right first metatarsal head and 2 on the left foot at the left fifth and lleft first metatarsal head. All of these look relatively similar. The one over the left fifth probe to bone I could not prove that any of the others did. I note his MRI in November that did not show osteomyelitis. His peripheral pulses seem robust. All of these underwent surgical debridement to remove callus nonviable skin and subcutaneous tissue 01/06/16; the patient had his sutures removed from the right fifth ray amputation. There may be a small open part of this superiorly but  otherwise the incision looks good. Areas over his right first, left first and left fifth metatarsal head all underwent surgical debridement as a varying degree of callus, skin and nonviable subcutaneous tissue. The area that is most worrisome is the right fifth metatarsal head which has a wound probes precariously close to bone. There is no purulent drainage or erythema 01/13/16; I'm not exactly sure of the status of the right fifth ray amputation site however he follows with Dr. [name] later this week. The area over his right first plantar metatarsal head, left first and fifth plantar metatarsal head are all in the same status. Thick circumferential callus, nonviable subcutaneous tissue. Culture of the left fifth did not culture last week 01/20/16; all of the patient's wounds appear and roughly the same state although his amputation site on the right lateral foot looks better. His wounds over the right first, left first and left fifth metatarsal heads all underwent difficult surgical debridement removing circumferential callus nonviable skin and subcutaneous tissue. There is no overt evidence of infection in these areas. MRI at the end of December of the left foot did not show osteomyelitis, right foot showed osteomyelitis of the right fifth digit he is now status post amputation. 01/27/16 the patient's wounds over his plantar first and fifth metatarsal heads on the left all appear better having been started on a total contact cast last week. They were debridement of circumferential callus and nonviable subcutaneous tissue as was the wound over the first metatarsal head. His surgical incision on the right also had some light surface debridement done. 03/23/2016 -- the patient was doing really well and most  of his wounds had almost completely healed but now he came back today with a history of having a discharge from the area of his right foot on the plantar aspect and also between his first and  second toe. He also has had some discharge from the left foot. Addendum: I spoke to the PA [name] at the dialysis center whose fax number is [phone]. We discussed the infection the patient has and she will put the patient on vancomycin and Fortaz until the final culture report is back. We will fax this as soon as available. 03/30/2016 -- left foot x-ray IMPRESSION:No definitive osteomyelitis noted. X-ray of the right foot -- IMPRESSION: 1. Soft tissue swelling. Prior amputation right fifth digit. No acute or focal bony abnormality identified. If osteomyelitis remains a clinical concern, MRI can be obtained. 2. Peripheral vascular disease. His culture reports have grown an MSSA -- and we will fax this report to his hemodialysis center. He was called in a prescription of oral doxycycline but I have told her not to fill this in as he is already on IV antibiotics. 04/06/2016 -- a few days ago, I spoke to the hemodialysis center nurse who had stopped the IV antibiotics and he was given a prescription for doxycycline 100 mg by mouth twice a day for a week and he is on this at the present time. 05/11/2016 -- he has recently seen his PCP this week and his hemoglobin A1c was 7. He is working on his paperwork to get his orthotic shoes. 05/25/2016 -- -- x-ray of the left foot IMPRESSION:No acute bony abnormality. No radiographic changes of acute osteomyelitis. No change since prior study. X-ray of the right foot -- IMPRESSION: Postsurgical changes are seen involving the fifth toe. No evidence of acute osteomyelitis. he developed a large blister on the medial part of his right foot and this opened out and drain fluid. 06/01/2016 -- he has his MRI to be done this afternoon. 06/15/2016 -- MRI of the left forefoot without contrast shows 2 separate regions of cutaneous and subcutaneous edema and possible ulceration and blistering along the ball of the foot. No obvious osteomyelitis  identified. MRI of the right foot showed cutaneous and subcutaneous thickening plantar to the first digit sesamoid with an ulcer crater but no underlying osteomyelitis is identified. 06/22/2016 -- the right foot plantar ulcer has been draining a lot of seropurulent material for the last few days. 06/30/2016 -- spoke to the dialysis center and I believe I spoke to [place] a PA at the center who discussed with me and agreed to putting Legrand Como on vancomycin until his cultures arrive. On review of his culture report no WBCs were seen or no organisms were seen and the culture was reincubated for better growth. The final report is back and there were no predominant growth including Streptococcus or Staphylococcus. Clinically though he has a lot of drainage from both wounds a lot of undermining and there is further blebs on the left foot towards the interspace between his first and second toe. 08/10/2016 -- the culture from the right foot showed normal skin flora and there was no Staphylococcus aureus was group A streptococcus isolated. 09/21/2016 -- -- MRI of the right foot was done on 09/13/2016 - IMPRESSION: Findings most consistent with acute osteomyelitis throughout the great toe, sesamoid bones and plantar aspect of the head of the first metatarsal. Fluid in the sheath of the flexor tendon of the great toe could be sympathetic but is worrisome for  septic tenosynovitis. First MTP joint effusion worrisome for septic joint. He was admitted to the hospital on 09/11/2016 and treated for a fever with vancomycin and cefepime. He was seen by Dr. Bobby Rumpf of infectious disease who recommended 6 weeks treatment with vancomycin and ceftazidime with his hemodialysis and to continue to see as in the wound clinic. Vascular consult was pending. The patient was discharged home on 09/14/2016 and he would continue with IV antibiotics for 6 weeks. 09/28/16 wound appears reasonably healthy. Continuing with  total contact cast 10/05/16 wound is smaller and looking healthy. Continue with total contact cast. He continues on IV antibiotics 10/12/2016 -- he has developed a new wound on the dorsal aspect of his left big toe and this is a superficial injury with no surrounding cellulitis. He has completed 30 days of IV antibiotics and is now ready to start his hyperbaric oxygen therapy as per his insurance company's recommendation. 10/19/2016 -- after the cast was removed on the right side he has got good resolution of his ulceration on the right plantar foot. he has a new wound on the left plantar foot in the region of his fourth metatarsal and this will need sharp debridement. 10/26/2016 -- Xray of the right foot complete: IMPRESSION: Changes consistent with osteomyelitis involving the head of the first metatarsal and base of the first proximal phalanx. The sesamoid bones are also likely involved given their positioning. 11/02/2016 -- there still awaiting insurance clearance for his hyperbaric oxygen therapy and hopefully he will begin treatment soon. 11/09/2016 -- started with hyperbaric oxygen therapy and had some barotrauma to the right ear and this was seen by ENT who was prescribed Afrin drops and would probably continue with HBO and he is scheduled for myringotomy tubes on Friday 11/16/2016 -- he had his myringotomy tubes placed on Friday and has been doing much better after that with some fluid draining out after hyperbaric treatment today. The pain was much better. 11/30/2016 -- over the last 2 days he noticed a swelling and change of color of his right second toe and this had been draining minimal fluid. 12/07/2016 -- x-ray of the right foot -- IMPRESSION:1. Progressive ulceration at the distal aspect of the second digit with significant soft tissue swelling and osseous changes in the distal phalanx compatible with osteomyelitis. 2. Chronic osteomyelitis at the first MTP joint. 3.  Ulcerations at the second and third toes as well without definite osseous changes. Osteomyelitis is not excluded. 4. Fifth digit amputation. On 12/01/2016 oo I spoke to Dr. Bobby Rumpf, the infectious disease specialist, who kindly agreed to treat this with IV antibiotics and he would call in the order to the dialysis center and this has been discussed in detail with the patient who will make the appropriate arrangements. The patient will also book an appointment as soon as possible to see Dr. [name] in the office. Of note the patient has not been on antibiotics this entire week as the dialysis center did not receive any orders from Dr. [name]. I got in touch with Dr. [name] who tells me the patient has an appointment to see him this coming Wednesday. 12/14/2016 -- the patient is on ceftazidime and vancomycin during his dialysis, and I understand this was put on by his nephrologist Dr. Raliegh Ip possibly after speaking with infectious disease Dr. [name] 12/23/16; patient was on my schedule today for a wound evaluation as he had difficulties with his schedule earlier this week. I note that he is on vancomycin and  ceftazidine at dialysis. In spite of this he arrives today with a new wound on the base of the right second toe this easily probes to bone. The known wound at the tip of the second toe With this area. He also has a superficial area on the medial aspect of the third toe on the side of the DIP. This does not appear to have much depth. The area on the plantar left foot is a deep area but did not probe the bone 12/28/16 -- he has brought in some lab work and the most recent labs done showed a hemoglobin of 12.1 hematocrit of 36.3, neutrophils of 51% WBC count of 5.6, BN of 53, albumin of 4.1 globulin of 3.7, vancomycin 13 g per mL. 01/04/2017 -- he saw Dr. [name] who has recommended a amputation of the right second toe and he is awaiting this date. He sees Dr. Bobby Rumpf of infectious  disease tomorrow. The bone culture taken on 12/28/2016 had no growth in 2 days. 01/11/2017 -- was seen by Dr. Bobby Rumpf regarding the management and has recommended a eval by vascular surgeons. He recommended to continue the antibiotics during his hemodialysis. Xray of the left foot -- IMPRESSION: No acute fracture, dislocation, or osseous erosion identified. 01/18/2017 -- he has his vascular workup later today and he is going to have his right foot second toe amputation this coming Friday by Dr. [name]. We have also put in for a another 30 treatments with hyperbaric oxygen therapy 01/25/2017 -- I have reviewed Dr. Donnetta Hutching is vascular report from last week where he reviewed him and thought his vascular function was good enough to heal his amputation site and no further tests were recommended. He also had his orthopedic related to surgery which is still pending the notes and we will review these next week. 02/01/2017 -- the operative remote of Dr. 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 02/08/2017 --he was seen by Dr. [name] on 02/03/2017 -- patient is on Remer. after a thorough review he had recommended to continue with antibiotics during hemodialysis. The patient was seen by Dr. [name], but I do not find any follow-up note on the electronic medical record. he has removed the dressing over the right foot to remove the sutures and asked him to see him back in a month's time 02/22/2017 -- patient has been febrile and has been having symptoms of the upper respiratory tract infection but has not been checked for the flu and it's  been over 4 days now. He says he is feeling better today. Some drainage between his left first and second toe and this needed to be looked at 03/08/2017 -- was seen by Dr. Bobby Rumpf on 03/07/2017 after review he will stop his antibiotics and see him back in a month to see if he is feeling well. 03/15/2017 -- he has completed his course of hyperbaric oxygen therapy and is doing fine with his health otherwise. 03/22/2017 -- his nutritionist at the dialysis center has recommended a protein supplement to help build his collagen and we will prescribe this for him when he has the details 05/03/2017 -- he recently noticed the area on the  plantar aspect of his fifth metatarsal head which opened out and had minimal drainage. 05/10/2017 -- -- x-ray of the left foot -- IMPRESSION:1. No convincing conventional radiographic evidence of active osteomyelitis.2. Active soft tissue ulceration at the tip of the fifth digit, and at the lateral aspect of the foot adjacent the base of the fifth metatarsal. 3. Surgical changes of prior fourth toe amputation. 4. 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 5. Small vessel atherosclerotic vascular calcifications. 6. 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 ======= Readmission after 5 weeks: 06/15/2017 -- the patient returns after 5 weeks having had an MRI done on 05/17/2017 MRI of the left foot without contrast showed soft tissue ulcer overlying the base of the fifth metatarsal. 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 No drainable fluid collection to suggest an abscess. He was in hospital between 05/16/2017 and 05/25/2017 -- and on discharge was asked to follow-up with Dr. [name] and Dr. [name]. He was treated 2 weeks post discharge with vancomycin plus ceftriaxone with hemodialysis and oral metronidazole 500 mg 3 times a day. The  patient underwent a left fifth ray amputation and excision on 5/23, but continued to have postoperative spikes of fever. last hemoglobin A1c was 6.7% He had a postoperative MR of the foot on 05/23/2017 -- which showed no new areas of cortical bone loss, edema or soft tissue ulceration to suggest osteomyelitis. the patient has completed his course of IV antibiotics during dialysis and is to see the infectious disease doctor tomorrow. Dr. [name] had seen him after suture removal and asked him to keep the wound with a dry dressing 06/21/2017 -- he was seen by Dr. Bobby Rumpf of infectious disease on 06/16/2017 -- he stopped his Flagyl and will see him back in 6 weeks. He was asked to follow-up with Dr. [name] and with the wound clinic clinic. 07/05/17; bone biopsy from last time showed acute osteomyelitis. This is from the reminiscent in the left fifth metatarsal. Culture result is apparently still pending [holding for anaerobe}. From my understanding in this case this is been a progressive necrotic wound which is deteriorated markedly over the last 3 weeks since he returned here. He now has a large area of exposed bone which was biopsied and cultured last week. Dr. Graylon Good has put him on vancomycin and Fortaz during his hemodialysis and Flagyl orally. He is to see Dr. [name] next week 07/19/2017 -- the patient was reviewed by Dr. [name] of orthopedics who reviewed the case in detail and agreed with the plan to continue with IV antibiotics, aggressive wound care and hyperbaric oxygen therapy. He would see him back in 3 weeks' time 08/09/2017 -- saw Dr. Bobby Rumpf on 08/03/2017 -he was restarted on his antibiotics for 6 weeks which included vanco, ceftaz and Flagyl. he recommended continuing his antibiotics for 6 weeks and reevaluate his completion date. He would continue with wound care and hyperbaric oxygen therapy. 08/16/2017 -- I understand he will be completing his 6 weeks of antibiotics  sometime later this week. 09/19/2017 -- he has been without his wound VAC for the last week due to lack of supplies. He has been packing his wound with silver alginate. 10/04/2017 -- he has an appointment with Dr. [name] tomorrow and hence we will not apply the wound VAC after his dressing changes today. 10/11/2017 -- he was seen by Dr. Bobby Rumpf on 10/05/2017, noted that  the patient was currently off antibiotics, and after thorough review he recommended he follow-up with the wound care as per plan and no further antibiotics at this point. 11/29/17; patient is arrived for the wound on his left lateral foot.Marland Kitchen He is completed IV antibiotics and 2 rounds of hyperbaric oxygen for treatment of underlying osteomyelitis. He arrives today with a surface on most of the wound area however our intake nurse noted drainage from the superior aspect. This was brought to my attention. 12/13/2017 -- the right plantar foot had a large bleb and once the bleb was opened out a large callus and subcutaneous debris was removed and he has a plantar ulcer near the fourth metatarsal head 12/28/17 on evaluation today patient appears to be doing acutely worse in regard to his left foot. The wound which has been appearing to do better as now open up more deeply there is bone palpable at the base of the wound unfortunately. He tells me that this feels "like it did when he had osteomyelitis previously" he also noted that his second toe on the left foot appears to be doing worse and is swollen there does appear to be some fluid collected underneath. His right foot plantar ulcer appears to be doing somewhat better at this point and there really is no complication at the site currently. No fevers, chills, nausea, or vomiting noted at this time. Patient states that he normally has no pain at this site however. Today he is having significant pain. 01/03/18; culture done last week showed methicillin sensitive staph aureus and  group B strep. He was on Septra however he arrives today with a fever of 101. He has a functional dialysis shunt in his left arm but has had no pain here. No cough. He still makes some urine no dysuria he did have abdominal pain nausea and diarrhea over the weekend but is not had any diarrhea since yesterday. Otherwise he has no specific complaints 01/05/18; the patient returns today in follow-up for his presentation from 1/8. At that point he was febrile. I gave him some Levaquin adjusted for his dialysis status. He tells me the fever broke that night and it is not likely that this was actually a wound infection. He is gone on to have an MRI of the left foot; this showed interval development of an abnormal signal from the base of the third and fourth metatarsal and in the cuboid reminiscent consistent with osteomyelitis. There is no mention of the left second toe I think which was a concern when it was ordered. The patient is taking his Levaquin 01/10/18; the patient has completed his antibiotics today. The area on the left lateral foot is smaller but it still probes easily to bone. He has underlying osteomyelitis here and sees Dr. Megan Salon of infectious disease this week ooThe area on the right third plantar metatarsal head still requires debridement not much change in dimensions [facility] has a new wound on the medial tip of his right third toe again this probes to bone. He only noticed this 2 days ago 01/17/18; the patient saw Dr. [name] last week and he is back on Vanco and Fortaz at dialysis. 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 We are previously seeing him for a wound also on the base of the fourth med head on the right. X- ray of this area did not  show osteomyelitis in relation to a new wound on the tip of his right third toe. Again this probes to bone. Finally his second toe  on the left which didn't really show anything on the MRI at least no report appears to have a separated cutaneous area which I think is going to come right off the tip of his toe and leave exposed bone. Whether this is infectious or ischemic I am not clear Culture I did from the third toe last week which was his new wound was negative. Plain x-ray on the right foot did not show osteomyelitis in the toes 01/24/18; the patient is going went to see Dr. [name]. He is going to have a amputation of the left second and right third toe although paradoxically the left second toe looks better than last week. We have treating a deep probing wound on the left lateral foot and the area over the third metatarsal head on the right foot. 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 Also a debridement including bone of the left lateral foot and a closure which is still sutured. This is a bit surprising. Also apparent debridement of the base of the right third toe wound. 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 I'm not sure exactly where we are in the frame of things here. The wound on the left lateral foot is still sutured also the amputation sites of the left second and right third toe. He is been using Xeroform to the sutured areas silver alginate to the right foot 02/14/18; he continues on Rocephin at dialysis for osteomyelitis. I still don't have a good sense of where we are in the treatment duration. The wounds on the left lateral foot had the sutures removed and this clearly still probes deeply. We debrided the area with a #5 curet. We'll use silver alginate to the wound oothe area over the right third met head also required debridement of callus skin and subcutaneous tissue  and necrotic debris over the wound surface. This tunnels superiorly but I did not unroofed this today. ooThe areas on the tip of his toes have a crusted surface eschar I did not debridement this either. 02/21/18; he continues on Rocephin at dialysis for osteomyelitis. I don't have a good sense of where we are and the treatment duration. The wounds on the left lateral foot is callused over on presentation but requires debridement. The area on the right third med head plantar aspect actually is measuring smaller 02/28/18;patient is on Vanco and Fortaz at dialysis, I'm not sure why I thought this was Rocephin above unless it is been changed. I don't have a good sense of time frame here. Using silver collagen to the area on the left lateral foot and right third med head plantar foot 03/07/18; patient is completing bank and Fortaz at dialysis soon. He is using Silver collagen to the area on the left lateral foot and the right third metatarsal head. He has a smaller superficial wound distal to the left lateral foot wound. 03/14/18-he is here in follow-up evaluation for multiple ulcerations to his bilateral feet. He presents with a new ulceration to the plantar aspect of the left foot underneath the blister, this was deroofed to reveal a partial thickness ulcer. He  is voicing no complaints or concerns, tolerated dialysis yesterday. We will continue with same treatment plan and he will follow-up next week 03/21/18; the patient has a area on the lateral left foot which still has a small probing area. The overall surface area of the wound is better. He presented with a new ulceration on the left foot plantar fifth metatarsal head last week. The area on the right third metatarsal head appears smaller.using silver alginate to all wound areas. The patient is changing the dressing himself. He is using a Darco forefoot offloading are on the right and a healing sandal on the left 03/28/18; original wound left lateral  foot. Still nowhere close to looking like a heeling surface oosecondary wound on the left lateral foot at the base of his question fifth metatarsal which was new last week ooRight third metatarsal head. ooWe have been using silver alginate all wounds 04/04/18; the original wound on the left lateral foot again heavy callus surrounding thick nonviable subcutaneous tissue all requiring debridement. The new wound from 2 weeks ago just near this at the base of the fifth metatarsal head still looks about the same ooUnfortunately there is been deterioration in the third medical head wound on the right which now probes to bone. I must say this was a superficial wound at one point in time and I really don't have a good frame of reference year. I'll have to go back to look through records about what we know about the right foot. 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 He is also been to see Dr. [name]. Somewhere in here as somebody is ordered a VASCULAR evaluation which was done on 03/22/18. On the right is anterior tibial artery was monophasic triphasic at the posterior tibial artery. On the left anterior tibial artery monophasic posterior tibial artery monophasic. ABI in the right was 1.43 on the left 1.21. TBIs on the right at 0.41 and on the left at 0.32. A vascular consult was recommended and I think has been arranged. 04/11/18; Mr. Tsosie has not had an MRI of the right foot since 2017. Recent x-ray of the right foot done in January and February was negative. However he has had a major deterioration in the wound over the third met head. He is completed antibiotics last week at dialysis [place]. I think he is going to see vascular in follow-up. The areas on the left lateral foot and left plantar fifth metatarsal head both look satisfactory. I debrided both of these areas although the tissue  here looks good. The area on the left lateral foot once probe to bone it certainly does not do that now 04/18/18; MRI of the right foot is on Thursday. He has what looks to be serosanguineous purulent drainage coming out of the wound over the right third metatarsal head today. He completed antibiotics bank and Fortaz 2 weeks ago at dialysis. He has a vascular follow-up with regards to his arterial insufficiency although I don't exactly see when that is booked. The areas on the left foot including the lateral left foot and the plantar left fifth metatarsal head look about the same. 04/25/18-He is here in follow-up evaluation for bilateral foot ulcers. MRI obtained was negative for osteomyelitis. Wound culture was negative. We will continue with same treatment plan he'll follow-up next week 05/02/18; the right plantar foot wound over the third metatarsal head actually looks better than  when I last saw this. His MRI was negative for osteomyelitis wound culture was negative. ooOn the left plantar foot both wounds on the plantar fifth metatarsal head and on the lateral foot both are covered in a very hard circumferential callus. 05/09/18; right plantar foot wound over the third metatarsal head stable from last week. ooLeft plantar foot wound over the fifth metatarsal head also stable but with callus around both wound areas ooThe area on the left lateral foot had thick callus over a surface and I had some thoughts about leaving this intact however it felt boggy. ooWe've been using silver alginate all wounds 05/16/18; since the patient was last here he was hospitalized from 5/15 through 5/19. 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 This grew MRSA. He was placed on vancomycin.MRI of the left foot suggested "progressive" osteomyelitis and bone destruction of the cuboid and the base of the fifth  metatarsal. Stable erosive changes of the base of the second metatarsal. I'm wondering if they're aware that he had surgery and debridement in the area of the underlying bone previously by Dr. [name]. In any case, He was also revascularized with an angioplasty and stenting of the left tibial peroneal trunk and angioplasty of the left posterior tibial artery. He is on vancomycin at dialysis. He has a 2 week follow-up with infectious disease. He has vascular surgery follow-up. He was started on Plavix. 05/23/18; the patient's wound on the lateral left foot at the level of the fifth metatarsal head and the plantar wound on the plantar fifth metatarsal head both look better. The area on the right third metatarsal head still has depth and undermining. We've been using silver alginate. He is on vancomycin. He has been revascularized on the left 05/30/18; he continues on vancomycin at dialysis. Revascularized on the left. The area on the left lateral foot is just about closed. Unfortunately the area over the plantar fifth metatarsal head undermining superiorly as does the area over the right third metatarsal head. Both of these significantly deteriorated from last week. He has appointments next week with infectious disease and orthopedics I delayed putting on a cast on the left until those appointments which therefore we made we'll bring him in on Friday the 14th with the idea of a cast on the left foot 06/06/18; he continues on vancomycin at dialysis. Revascularized on the left. He arrives today with a ballotable swelling just above the area on the left lateral foot where his previous wound was. He has no pain but he is reasonably insensate. The difficult areas on the plantar left fifth metatarsal head looks stable whereas the area on the right third plantar metatarsal head about the same as last week there is undermining here although I did not unroofed this today. He required a considerable debridement of  the swelling on the left lateral foot area and I unroofed an abscess with a copious amount of brown purulent material which I obtained for culture. I don't believe during his recent hospitalization he had any further imaging although I need to review this. Previous cultures from this area done in this clinic showed MRSA, I would be surprised if this is not what this is currently even though he is on vancomycin. 06/13/18; he continues on vancomycin at dialysis however he finishes this on Sunday and then graduates the doxycycline previously prescribed by Dr. [name]. The abscess site  on the lateral foot that I unroofed last time grew moderate amounts of methicillin-resistant staph aureus that is both vancomycin and tetracycline sensitive so we should be okay from that regard. He arrives today in clinic with a connection between the abscess site and the area on the lateral foot. We've been using silver alginate to all his wound areas. The right third metatarsal head wound is measuring smaller. Using silver alginate on both wound areas 06/20/18; transitioning to doxycycline prescribed by Dr. [name]. The abscess site on the lateral foot that I unroofed 2 weeks ago grew MRSA. That area has largely closed down although the lateral part of his wound on the fifth metatarsal head still probes to bone. I suspect all of this was connected. ooOn the right third metatarsal head smaller looking wound but surrounded by nonviable tissue that once again requires debridement using silver alginate on both areas 06/27/18; on doxycycline 100 twice a day. The abscess on the lateral foot has closed down. Although he still has the wound on the left fifth metatarsal head that extends towards the lateral part of the foot. The most lateral part of this wound probes to bone ooRight third metatarsal head still a deep probing wound with undermining. Nevertheless I elected not to debridement this this week ooIf everything is  copious static next week on the left I'm going to attempt a total contact cast 07/04/18; he is tolerating his doxycycline. The abscess on the left lateral foot is closed down although there is still a deep wound here that probes to bone. We will use silver collagen under the total contact cast Silver collagen to the deep wound over the right third metatarsal head is well 07/11/18; he is tolerating doxycycline. The left lateral fifth plantar metatarsal head still probes deeply but I could not probe any bone. We've been using silver collagen and this will be the second week under the total contact cast ooSilver collagen to deep wound over the right third metatarsal head as well 07/18/18; he is still taking doxycycline. The left lateral fifth plantar metatarsal this week probes deeply with a large amount of exposed bone. Quite a deterioration. He required extensive debridement. Specimens of bone for pathology and CNS obtained ooAlso considerable debridement on the right third metatarsal head 07/25/18; bone for pathology last week from the lateral left foot showed osteomyelitis. CandS of the bone Enterobacter. And Klebsiella. He is now on ciprofloxacin and the doxycycline is stopped [Dr. [name] of infectious disease] area He has an appointment with Dr. [name] tomorrow I'm going to leave the total contact cast off for this week. May wish to try to reapply that next week or the week after depending on the wound bed looks. I'm not sure if there is an operative option here, previously is followed with Dr. [name] 08/01/18 on evaluation today patient presents for reevaluation. He has been seen by infectious disease, Dr. [name], and he has placed the patient back on Ceftaz currently to be given to him at dialysis three times a week. 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 Fortunately there does not appear to be the evidence of infection  at this point in time. Overall the patient has been tolerating the dressing changes without complication. We are no longer due lives in the cast of left foot secondary to the infection obviously. 08/10/18 on evaluation today patient appears to be doing rather well all things considered in regard  to his left plantar foot ulcer. He does have a significant callous on the lateral portion of his left foot he wonders if I can help clean that away to some degree today. Fortunately he is having no evidence of infection at this time which is good news. No fevers chills noted. With that being said his right plantar foot actually does have a significant callous buildup around the wound opening this seems to not be doing as well as I would like at this point. I do believe that he would benefit from sharp debridement at this site. Subsequently I think a total contact cast would be helpful for the right foot as well. 08/17/18; the patient arrived today with a total contact cast on the right foot. This actually looks quite good. He had gone to see Dr. Megan Salon of infectious disease about the osteomyelitis on the left foot. I think he is on IV Fortaz at dialysis although I am not exactly sure of the rationale for the Fortaz at the time of this dictation. He also arrived in today with a swelling on the lateral left foot this is the site of his original wounds in fact when I first saw this man when he was under Dr. Ardeen Garland care I think this was the site of where the wound was located. It was not particularly tender however using a small scalpel I opened it to [place] some moderate amount of purulent drainage. 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 Last MRI was in May that showed progressive osteomyelitis and bone destruction of the cuboid and base of the fifth metatarsal. Last week he had  an abscess over this same area this was removed. Culture was negative. ooWe are continuing with a total contact cast to the area on the third metatarsal head on the right and making some good progress here. The patient asked me again about renal transplant. He is not on the list because of the open wounds. 08/31/18; apparently the patient had an interruption in the [place] but he is now back on this at dialysis. Apparently there was a misunderstanding and Dr. Crissie Figures orders. Due to have the MRI of the left foot tonight. ooThe area on the right foot continues to have callus thick skin and subcutaneous tissue around the wound edge that requires constant debridement however the wound is smaller we are using a total contact cast in this area. Alginate all wounds 09/07/18; without much surprise the MRI of his left foot showed osteomyelitis in the reminiscent of his fourth metatarsal but also the third metatarsal. She arrives in clinic today with the right foot and a total contact cast. There was purulent drainage coming out of the wound which I have cultured. Marked deterioration here with undermining widely around the wound orifice. Using pickups and a scalpel I remove callus and subcutaneous tissue from a substantial new opening. ooIn a similar fashion the area on the left lateral foot that was blistered last week I have opened this and remove skin and subcutaneous tissue from this area to expose a obvious new wound. I think there is extension and communication between all of this on the left foot. ooThe patient is on Fortaz at dialysis. Culture done of the right foot. We are clearly not making progress here. ooWe have made him an appointment with Dr. [name] of orthopedics. I think an amputation of  the left leg may be discussed. I don't think there is anything that can be done with foot salvage. 09/14/18; culture from the right foot last time showed a very resistant MRSA. This is resistant to  doxycycline. I'm going to try to get linezolid at least for a week. He does not have a appointment with Dr. [name] yet. 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 I'll send a message to Dr. [name] of infectious disease. I want the patient to see Dr. [name] to go over the pros and cons of an amputation which I think will be a BKA. The patient had a question about whether this is curative or not. I told him that I thought it would be although spread of staph aureus infection is not unheard of. MRI of the right foot was done at the end of April 2019 did not show osteomyelitis. The patient last saw Dr. [name] on 9/3. I'll send Dr. [name] a message. I would really like him to weigh the pros and cons of a BKA on the left otherwise he'll probably need IV antibiotics and perhaps hyperbaric oxygen again. He has not had a good response to this in the past. His MRI earlier this month showed progressive damage in the remnants of the fourth and third metatarsal 09/21/2018; sees Dr. [name] of orthopedics next week to discuss a left BKA in response to the osteomyelitis in the left foot. Using silver alginate to all wound areas. He is completing the Zyvox I did put in for him after last culture showed MRSA 09/29/2018; patient saw Dr. [name] of orthopedics discussed the osteomyelitis in the left foot third and fourth metatarsals. He did not recommend urgent surgery but certainly stated the only surgical option would be a BKA. He is communicating with Dr. [name]. The problem here is the instability of the areas on his foot which constantly generate draining abscesses. I will communicate with Dr. [name] about this. He has an appointment on 10/23 10/05/2018; patient sees Dr. [name] on 10/23. I have sent him a secure message not ordered any additional antibiotics for now. Patient continues to have 2 open areas on the left plantar foot at the fifth  metatarsal head and the lateral aspect of the foot. These have not changed all that much. The area on the right third met head still has thick callus and surrounding subcutaneous tissue we have been using silver alginate 10/12/2018; patient sees Dr. [name] or colleague on 10/23. 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 The area on the right plantar foot is just about closed. The superior wound on the left fifth metatarsal head is callused over but I am not sure if this is closed. The area below it is about the same. Considerable amount of callus on the lateral foot. We have been using silver alginate to the wounds 10/19/2018; the patient saw Dr. [name] on 10/22. He wishes to try and continue to save the left foot. He has been given 6 weeks of oral ciprofloxacin. We continue to put a total contact cast on the right foot. The area over the fifth metatarsal head on the left is callused over/perhaps healed but I did not remove the callus to find out. He still has the wound on the left lateral midfoot requiring debridement. We have been using silver alginate to all wound areas 10/26/2018 ooOn the left foot  our intake nurse noted some purulent drainage from the inferior wound [currently the only one that is not callused over] ooOn the right foot even though he is in total contact cast a considerable amount of thick black callus and surface eschar. On this side we have been using silver alginate under a total contact cast. oohe remains on ciprofloxacin as prescribed by Dr. [name] 11/02/2018; culture from last week which is done of the probing area on the left midfoot wound showed MRSA "few". I am going to need to contact Dr. [name] which I will do today I think he is going to need IV antibiotics again. The area on the left foot which was callused on the side is clearly separating today all of this was removed a copious amounts of  callus and necrotic subcutaneous tissue. ooThe area on the right plantar foot actually remained healthy looking with a healthy granulated base. ooWe are using silver alginate to all wound areas 11/09/2018; unfortunately neither 1 of the patient's wounds areas looks at all satisfactory. On the left he has considerable necrotic debris from the plantar wound laterally over the foot. I removed copious amounts of material including callus skin and subcutaneous tissue. On the right foot he arrives with undermining and frankly purulent drainage. Specimen obtained for culture and debridement of the callused skin and subcutaneous tissue from around the circumference. Because of this I cannot put him back in a total contact cast but to be truthful we are really unfortunately not making a lot of progress I did put in a secure text message to Dr. [name] wondering about the MRSA on the left foot. He suggested vancomycin although this is not started. I was left wondering if he expected me to send this into dialysis. Has we have more purulence on the right side wait for that result before calling dialysis 11/16/2018; I called dialysis earlier this week to get the vancomycin started. I think he got the first dose on Tuesday. The patient tells me that he fell earlier this week twisting his left foot and ankle and he is swelling. He continues to not look well. He had blood cultures done at dialysis on Tuesday he is not been informed of the results. 12/07/2018; the patient was admitted to hospital from 11/22/2018 through 12/02/2018. He underwent a left BKA. Apparently had staph sepsis. In the meantime being off the right foot this is closed over. He follows up with Dr. 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 Subsequent to this he actually had multiple falls in fact he tells me to following which  calls the wound to be his apparently. He has been seeing Dr. [name] and his physician assistant in the interim. They actually did want him to come back for reevaluation to see if there's anything we can do for me wound care perspective the help this area to heal more appropriately. The patient states that he does not have a tremendous amount of pain which is good news. No fevers, chills, nausea, or vomiting noted at this time. We have gotten approval from Dr. [name] office had [place] for [place] to treat the patient for his stop wound. This is due to the fact that the patient was in the 90 day postop global. 1/21; the patient does not have an open wound on the right foot although he does have some pressure areas that will need to be  padded when he is transferring. The dehisced surgical wound looks clean although there is some undermining of 1.5 cm superiorly. 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 This appears healthy but there is undermining. TheraSkin #1 2/11; TheraSkin #2 2/25; TheraSkin #3. Wound is measuring smaller 3/10; TheraSkin #4 wound is measuring smaller 3/24; TheraSkin #5 wound is measuring smaller and looks healthy. 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 He has no open area on the right foot although he does have some callused area over the original third metatarsal head wound. The third metatarsal is also subluxed on the right foot. I have warned him today that he cannot consider wearing a prosthesis for at least a month but he can go for measurements. He is going to need to keep the right foot padded is much as possible in his diabetic shoe indefinitely READMISSION 06/12/2019 Mr. Clarin is a type II diabetic on dialysis. He has been in this clinic multiple times with wounds on his bilateral lower extremities. Most recently here at the beginning of this year for 3  months with a wound on his left BKA amputation site which we managed to get to close over. He also has a history of wounds on his right foot but tells me that everything is going well here. He actually came in here with his prosthesis walking with a cane. We were all really quite gratified to see this He states over the last several weeks he has had a painful area at the tip of the left third finger. His dialysis shunt in his is in the left upper arm and this particularly hurts during dialysis when his fingers get numb and there is a lot of pain in the tip of his left third finger. I believe he is seen vascular surgery. He had a Doppler done to evaluate for a dialysis steal syndrome. He is going for a banding procedure 1 week from today towards the left AV fistula. I think if that is unsuccessful they will be looking at creating a new shunt. He had a course of doxycycline when he which he finished about a week ago. He has been using Bactroban to the finger 6/25; the patient had his banding procedure and things seem to be going better. He is not having pain in his hand at dialysis. The area on the tip of his finger seems about closed. He did however have a paronychia I the last time I saw him. I have been advising him to use topical Bactroban washing the finger. Culture I did last time showed a few staph epidermidis which I was willing to dismiss as superficial skin contaminant. He arrives today with the finger wound looking better however the paronychia a seems worse 7/9; 2-week follow-up. He does not have an open wound on the tip of his third left finger. I thought he had an initial ischemic wound on the tip of the finger as well as a paronychia a medially. All of this appears to be closed. 8/6-Patient returns after 3 weeks for follow-up and has a right second met head plantar callus with a significant area of maceration hiding an ulcer ABI repeated today on right - 1.26 8/13-Patient returns at  1 week, the right second met head plantar wound appears to be slightly better, it is surrounded by the callus area we are using silver alginate 8/20; the patient  came back to clinic 2 weeks ago with a wound on the right second metatarsal head. He apparently told me he developed callus in this area but also went away from his custom shoes to a pair of running shoes. Using silver alginate. 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 There might be much we can do to offload this although the patient tells me he has a wheelchair that he is using to try and stay off the wound is much as possible 8/27; right second metatarsal head. Still small punched out area with thick callus and subcutaneous tissue around the wound. We have been using silver collagen. This is always been an issue with this man's wound especially on this foot 9/3; right second met head. Still the same punched-out area with thick callus and subcutaneous tissue around the wound removing the circumference demonstrates repetitively undermining area. I have removed all the subcutaneous tissue associated with this. We have been using silver collagen 9/15; right second metatarsal head. Same punched-out thick callus and subcutaneous tissue around the wound area. Still requiring debridement we have been using silver collagen I changed back to silver alginate X-ray last time showed no evidence of osteomyelitis. Culture showed Klebsiella and he was started and Keflex apparently just 4 days ago 9/24; right second metatarsal head. He is completed the antibiotics I gave him for 10 days. Same punched-out thick callus and subcutaneous tissue around the area. Still requiring debridement. We have been using silver alginate. The area itself looks swollen and this could be just [place] that comes on this area with walking on a prosthesis on the left however I think he needs an MRI and I am going to order that today. There is not an  option to offload this further 10/1; right second plantar metatarsal head. MRI booked for October 6. Generally looking better today using silver alginate 10/8; right second plantar metatarsal head. MRI that was done on 10/6 was negative for osteomyelitis noted prior amputations of the second and fourth toes at the [facility]. Prior amputation of the third toe to the base of the middle phalanx. Prior amputation of the fifth toe to the head of the metatarsal. 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 He does not recall about hearing about this. He did not have any pain but then again he has reduced sensation from diabetic neuropathy 10/15; right second plantar metatarsal head. Still in the same condition callus nonviable subcutaneous tissue which cleans up nicely with debridement but reforms by the next week. The patient tells me that he is offloading this is much as he can including taking his wheelchair to dialysis. We have been using silver alginate, changed to silver collagen today 10/22; right second plantar metatarsal head. Wound measures smaller but still thick callus around this wound. Still requiring debridement 11/5 right second plantar metatarsal head. Less callus around the wound circumference but still requiring debridement. There is still some undermining which I cleaned out the wound looks healthy but still considerable punched out depth with a relatively small circumference to the wound there is no palpable bone no purulent drainage 11/12; right second plantar metatarsal head. Small wound with thick callus tissue around the wound and significant undermining. We have been using silver collagen after my continuous debridements of this area 11/19; patient arrives in today with purulent drainage coming out of the wound in the second third met head area on the right  foot. Serosanguineous thick drainage. Specimen obtained for  culture. 11/21/2019 on evaluation today patient appears to be doing well with regard to his foot ulcer. The good news is is culture came back negative for any bacteria there was no growth. The other good news is his x-ray also appears to be doing well. The foot is also measuring much better than what it was. Overall I am very pleased with how things seem to be progressing. 12/22; patient was in [facility] for several weeks due to the death of a family member. He said he stayed off the wound using silver alginate. 01/03/2020. Patient has a small open area with roughly 0.3 cm of circumferential undermining. The orifice looks smaller. We have been using silver collagen. There is not a wound more aggressive way to offload this area as he has a prosthesis on the other leg 1/21; again the same small area is 2 weeks ago. We have been using silver collagen I change that to endoform today I really believe that this is an offloading issue 2/4; the area on the right third met head. We have been using endoform. 2/11 the area on the right third met head we have been using silver alginate. 2/25; the area on the right third met head. There is much less callus on this today. The patient states he is offloading this even more aggressively. As an example he is taking his wheelchair into dialysis on most days 3/4; right third metatarsal head. Again he has eschar over the surface of the wound which I removed with a #3 curette. Some subcutaneous debris. This has 0.8 cm of direct probing depth. I cannot feel any bone here. I did do a culture the area 3/11; right third metatarsal head. Again an eschared area over the wound which almost makes this look superficial. Again debridement reveals a wound with probing depth probably not much different from last week there is no palpable bone no palpable purulence. The patient tells me that he is making every effort to offload this area properly. He is taking wheelchair  into dialysis etc. There is no option for forefoot offloading or a total contact cast. We used Oasis #1 today 3/18; again callus and eschar over the wound surface. I remove this but still a probing wound although only 3 mm in depth this time. This is an improvement 3/25; again callus and eschar over the wound surface which I removed the wound is much deeper today at 0.7 cm. There is no palpable bone. Because of the appearance of this a culture was done. I am not going to put the Oasis back in this. Concerned about underlying infection. Notable for the fact that he is just finished doxycycline for the last go round of this 3/30; still 0.7 cm in depth which is disappointing. Culture I gave last time showed a few Staph aureus which is methicillin susceptible. This should have been taken care of by the recent course of doxycycline I gave him. We are using silver alginate 4/6; almost 1 cm in depth. We have been using silver alginate with underlying Bactroban. 4/15; about 1 cm in depth still. There is no palpable bone but there is undermining thick surface again as usual. Our intake nurse noted what looked to be purulent drainage I suppose that could have been Bactroban but I did a culture for CandS 4/20; depth down 2.7 cm. Culture from last time was negative we have been using Bactroban and silver alginate Objective Constitutional Patient is  hypotensive. However appears well. This is not unusual for him. Respirations regular, non-labored and within target range.. Patient is febrile today. Mild fever. Appears in no distress. Vitals Time Taken: 2:21 PM, Height: 73 in, Weight: 202 lbs, BMI: 26.6, Temperature: 99.5 F, Pulse: 81 bpm, Respiratory Rate: 18 breaths/min, Blood Pressure: 96/56 mmHg, Capillary Blood Glucose: 150 mg/dl. General Notes: Wound exam; still a small hole in the middle of the right second metatarsal head. There is much less callus which suggest that he is staying off this. As near  as I can tell there is not a lot of undermining here. But I can see looks stable in terms of the wound bed. Is no evidence of surrounding infection Integumentary (Hair, Skin) Wound #41 status is Open. Original cause of wound was Gradually Appeared. The wound is located on the Right Metatarsal head second. The wound measures 0.3cm length x 0.3cm width x 0.6cm depth; 0.071cm^2 area and 0.042cm^3 volume. There is Fat Layer (Subcutaneous Tissue) Exposed exposed. There is no tunneling noted, however, there is undermining starting at 12:00 and ending at 12:00 with a maximum distance of 1cm. There is a medium amount of serosanguineous drainage noted. The wound margin is well defined and not attached to the wound base. There is large (67-100%) pink, pale granulation within the wound bed. There is no necrotic tissue within the wound bed. 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 - Next week. Dressing Change Frequency: Wound #41 Right Metatarsal head second: Change dressing every day. Wound Cleansing: May shower and wash wound with soap and water. Primary Wound Dressing: Wound #41 Right Metatarsal head second: Calcium Alginate with Silver - apply thin layer of bactroban then pack calcium alginate into wound bed. Secondary Dressing: Wound #41 Right Metatarsal head second: Foam - foam donut Kerlix/Rolled Gauze Dry Gauze Other: - felt to periwound and place one below wound. lightly wrap coban to hold dressing in place. felt / pad wound Edema Control: Avoid standing for long periods of time Elevate legs to the level of the heart or above for 30 minutes daily and/or when sitting, a frequency of: - throughout the  day. Off-Loading: Other: - felt to periwound and place one below wound. Additional Orders / Instructions: Other: - minimize walking and standing on foot to aid in wound healing and callus buildup. 1. Bactroban silver alginate to continue 2. Depth is come in by 0.3 cm currently of 0.7 cm Electronic Signature(s) Signed: 04/15/2020 5:47:38 PM By: Linton Ham MD Entered By: Linton Ham on 04/15/2020 15:26:10 -------------------------------------------------------------------------------- SuperBill Details Patient Name: Date of Service: RICKARDO, BRINEGAR 04/15/2020 Medical Record [id number] Patient Account Number: [id number] Date of Birth/Sex: Treating RN: 09-11-1951 (69 y.o. [name]) Carlene Coria Primary Care Provider: [name] Other Clinician: Referring Provider: Treating Provider/Extender:Robson, Jeronimo Greaves, Buena Irish in Treatment: 44 Diagnosis Coding ICD-10 Codes Code Description E11.621 Type 2 diabetes mellitus with foot ulcer L97.512 Non-pressure chronic ulcer of other part of right foot with fat layer exposed I70.298 Other atherosclerosis of native arteries of extremities, other extremity L03.115 Cellulitis of right lower limb Facility Procedures The patient participates with Medicare or their insurance follows the Medicare Facility Guidelines: CPT4 Code Description Modifier Quantity [id number] [place] VISIT-LEV 3 EST PT 1 Physician Procedures CPT4 Code Description: [id number]  99213 - WC PHYS LEVEL 3 - EST PT ICD-10 Diagnosis Description L97.512 Non-pressure chronic ulcer of other part of right foot wit E11.621 Type 2 diabetes mellitus with foot ulcer Modifier: h fat layer e Quantity: 1 xposed Electronic Signature(s) Signed: 04/15/2020 5:47:38 PM By: Linton Ham MD Entered By: Linton Ham on 04/15/2020 15:26:37

## 2020-04-21 NOTE — Progress Notes (Signed)
Cardiology Clinic Note   Patient Name: [name] HARJU Sr. Date of Encounter: 04/22/2020  Primary Care Provider:  [name], MD Primary Cardiologist:  Kirk Ruths, MD  Patient Profile    [name], Sr. 69 year old male presents today for follow-up evaluation of his hypertension, cardiomegaly, dilated cardiomyopathy, paroxysmal SVT, and PAD.  Past Medical History    Past Medical History:  Diagnosis Date  . Cardiomyopathy ([place])   . Cataract    right eye  . Diabetes mellitus    type 2  . Diabetic foot ulcer ([place])   . Diabetic infection of right foot ([place])   . Diabetic peripheral neuropathy ([place])   . ESRD (end stage renal disease) ([place])    m-w-f [facility] [address]  . Glaucoma    left eye  . Headache   . HTN (hypertension)    reports not needed medication for 2 years  . Hypotension   . Osteomyelitis ([place])   . Peripheral vascular disease ([place])    critical limb ischemia   Past Surgical History:  Procedure Laterality Date  . ABDOMINAL AORTOGRAM N/A 05/04/2018   Procedure: ABDOMINAL AORTOGRAM;  Surgeon: Conrad Bryce, MD;  Location: [place] [facility];  Service: Cardiovascular;  Laterality: N/A;  . AMPUTATION Left 11/20/2018   Procedure: AMPUTATION BELOW KNEE;  Surgeon: Dorna Leitz, MD;  Location: [place];  Service: Orthopedics;  Laterality: Left;  . AMPUTATION TOE Right 01/21/2017   Procedure: AMPUTATION 2ND TOE RIGHT FOOT ;  Surgeon: Dorna Leitz, MD;  Location: [place];  Service: Orthopedics;  Laterality: Right;  . AMPUTATION TOE Left 05/18/2017   Procedure: 5TH RAY AMPUTATION  and EXCISION CUBOID;  Surgeon: Dorna Leitz, MD;  Location: [place];  Service: Orthopedics;  Laterality: Left;  . AMPUTATION TOE Bilateral 01/27/2018   Procedure: RIGHT FOOT 3RD TOE AMPUTATION WITH INCISIONAL WOUND DEBRIDEMENT. LEFT FOOT 2ND TOE AMPUTATION AND INCISIONAL WOUND DEBRIDEMENT.;  Surgeon: Dorna Leitz, MD;  Location: [facility];  Service: Orthopedics;  Laterality:  Bilateral;  . AV FISTULA PLACEMENT Left 11/27/2014   Procedure: ARTERIOVENOUS (AV) FISTULA CREATION;  Surgeon: Conrad Montross, MD;  Location: [place];  Service: Vascular;  Laterality: Left;  . CHOLECYSTECTOMY    . COLONOSCOPY N/A 11/28/2014   Procedure: COLONOSCOPY;  Surgeon: Ladene Artist, MD;  Location: [facility]OSCOPY;  Service: Endoscopy;  Laterality: N/A;  . ESOPHAGOGASTRODUODENOSCOPY N/A 11/28/2014   Procedure: ESOPHAGOGASTRODUODENOSCOPY (EGD);  Surgeon: Ladene Artist, MD;  Location: [facility] ENDOSCOPY;  Service: Endoscopy;  Laterality: N/A;  . EYE SURGERY     retina and cataract left eye surgery  . I & D EXTREMITY  01/28/2012   Procedure: IRRIGATION AND DEBRIDEMENT EXTREMITY;  Surgeon: Paulene Floor, MD;  [facility]: [facility];  Service: Orthopedics;  Laterality: Left;  irrigation and drainage left hand  . I & D EXTREMITY  01/30/2012   Procedure: IRRIGATION AND DEBRIDEMENT EXTREMITY;  Surgeon: Paulene Floor, MD;  [facility]: [facility];  Service: Orthopedics;  Laterality: Left;  flexor teno synovectomy and extensor tenosynovectomy arthrosynovectomy wristjoint  . I & D EXTREMITY  02/01/2012   Procedure: IRRIGATION AND DEBRIDEMENT EXTREMITY;  Surgeon: Paulene Floor, MD;  [facility]: [facility];  Service: Orthopedics;  Laterality: Left;  Left Wrist, Hand and Forearm  . IRRIGATION AND DEBRIDEMENT FOOT Left 01/21/2017   Procedure: DEBRIDEMENT LEFT PLANTAR WOUND;  Surgeon: Dorna Leitz, MD;  Location: [place];  Service: Orthopedics;  Laterality: Left;  . Left 4th toe amputation    .  LEFT AND RIGHT HEART CATHETERIZATION WITH CORONARY ANGIOGRAM N/A 11/26/2014   Procedure: LEFT AND RIGHT HEART CATHETERIZATION WITH CORONARY ANGIOGRAM;  Surgeon: Leonie Man, MD;  Location: [facility] CATH LAB;  Service: Cardiovascular;  Laterality: N/A;  . LOWER EXTREMITY ANGIOGRAPHY Bilateral 05/04/2018   Procedure: Lower Extremity Angiography;  Surgeon: Conrad [place], MD;  [place] [facility];  Service: Cardiovascular;   Laterality: Bilateral;  . LOWER EXTREMITY ANGIOGRAPHY Left 05/11/2018   Procedure: LOWER EXTREMITY ANGIOGRAPHY;  Surgeon: Conrad [place], MD;  Location: [place] [facility];  Service: Cardiovascular;  Laterality: Left;  . PERIPHERAL VASCULAR BALLOON ANGIOPLASTY Left 05/11/2018   Procedure: PERIPHERAL VASCULAR BALLOON ANGIOPLASTY;  Surgeon: Conrad [place], MD;  Location: [place] [facility];  Service: Cardiovascular;  Laterality: Left;  Posterior tibial  . PERIPHERAL VASCULAR INTERVENTION Left 05/11/2018   Procedure: PERIPHERAL VASCULAR INTERVENTION;  Surgeon: Conrad Gideon, MD;  Location: [place] [facility];  Service: Cardiovascular;  Laterality: Left;  tibioperoneal trunk  . REVISION OF ARTERIOVENOUS GORETEX GRAFT Left 06/19/2019   Procedure: BANDING OF ARTERIOVENOUS FISTULA LEFT ARM;  Surgeon: Angelia Mould, MD;  Location: [place];  Service: Vascular;  Laterality: Left;  . TEE WITHOUT CARDIOVERSION N/A 11/20/2018   Procedure: TRANSESOPHAGEAL ECHOCARDIOGRAM (TEE);  Surgeon: Jerline Pain, MD;  Location: [facility];  Service: Cardiovascular;  Laterality: N/A;  . TONSILLECTOMY      Allergies  Allergies  Allergen Reactions  . Penicillins Hives, Swelling and Other (See Comments)    Ankle swelling Has patient had a PCN reaction causing immediate rash, facial/tongue/throat swelling, SOB or lightheadedness with hypotension: Yes Has patient had a PCN reaction causing severe rash involving mucus membranes or skin necrosis: No Has patient had a PCN reaction that required hospitalization: No Has patient had a PCN reaction occurring within the last 10 years: No If all of the above answers are "NO", then may proceed with Cephalosporin use.    History of Present Illness    [name] is a past medical history of end-stage renal disease on dialysis, insulin-dependent diabetes, hypertension, treated dyslipidemia, nonischemic cardiomyopathy with normal coronaries and November 2016.  Echocardiogram 3/19 showed  an EF of 50-55% with basilar hypertrophy and moderate LVH.  He had significant PVD and had nonhealing ulcers on bilateral feet.  He was followed by Dr. [name] at the wound center.  5/19 he underwent left tibioperoneal trunk angioplasty by Dr. Donzetta Matters.  He is also followed by Dr. Doren Custard for his foot wounds.  He was seen by Kerin Ransom, PA-C on 10/31/2018 on referral from Dr. Marval Regal for an episode of orthostatic syncope and near syncope.  These episodes were postdialysis however occurred on nondialysis days as well.  He stated he could tell when he was going to pass out and had only collapsed 1 time at that point.  He denied tachycardia.  His Midrin was increased to 10 mg 3 times daily.  He presents the clinic today for follow-up evaluation and states he has stopped his midodrine due to side effects.  He states he has continued to have issues regulating his blood pressure.  Nephrology has been working on adjusting his dry weight and his fluid in his pole during HD.  He has no cardiac complaints today and states that he closely watches his diet and fluid intake.  He does state that he wishes he was able to drink more.  He has a right foot wound that is being treated by the wound center.  He continues to be helpful but he may receive a kidney.  He has had no further episodes of syncope.  I will have him follow-up with Dr. Stanford Breed in 12 months.  Today he denies chest pain, shortness of breath, lower extremity edema, fatigue, palpitations, melena, hematuria, hemoptysis, diaphoresis, weakness, presyncope, syncope, orthopnea, and PND.   Home Medications    Prior to Admission medications   Medication Sig Start Date End Date Taking? Authorizing Provider  acetaminophen (TYLENOL) 325 MG tablet Take 650 mg by mouth every 6 (six) hours as needed (pain).     [provider]  atorvastatin (LIPITOR) 40 MG tablet Take 40 mg by mouth at bedtime.     [provider]  Continuous Blood Gluc Sensor  (FREESTYLE LIBRE 14 DAY SENSOR) MISC 1 Cartridge by Does not apply route every 14 (fourteen) days. 12/01/18   Love, Ivan Anchors, PA-C  doxycycline (VIBRA-TABS) 100 MG tablet Take 100 mg by mouth 2 (two) times daily.    [provider]  ferric citrate (AURYXIA) 1 GM 210 MG(Fe) tablet Take 420 mg by mouth 3 (three) times daily with meals.     [provider]  glucose monitoring kit (FREESTYLE) monitoring kit 1 each by Does not apply route 4 (four) times daily - after meals and at bedtime. 1 month Diabetic Testing Supplies for QAC-QHS accuchecks.Any brand OK 11/29/14   Thurnell Lose, MD  hydrocerin (EUCERIN) CREA Apply 1 application topically 2 (two) times daily. To right leg and Patient taking differently: Apply 1 application topically daily. To right leg and 12/01/18   Love, Ivan Anchors, PA-C  insulin NPH-regular Human (NOVOLIN 70/30) (70-30) 100 UNIT/ML injection Inject 5-11 Units into the skin 3 (three) times daily before meals. Sliding Scale - based on CBG    [provider]  Insulin Syringe-Needle U-100 25G X 1" 1 ML MISC Any brand, for 4 times a day insulin SQ, 1 month supply. 11/29/14   Thurnell Lose, MD  multivitamin (RENA-VIT) TABS tablet Take 1 tablet by mouth daily. 01/09/19   [provider]  mupirocin ointment (BACTROBAN) 2 % Apply 1 application topically daily as needed (rassh).  08/05/16   [provider]  oxyCODONE (ROXICODONE) 5 MG immediate release tablet Take 1 tablet (5 mg total) by mouth every 4 (four) hours as needed. Patient not taking: Reported on 07/27/2019 06/19/19   Angelia Mould, MD  primidone (MYSOLINE) 50 MG tablet Take 1 tablet (50 mg total) by mouth at bedtime. 12/01/18   Love, Ivan Anchors, PA-C  timolol (TIMOPTIC) 0.5 % ophthalmic solution Place 1 drop into the left eye every morning.     [provider]    Family History    Family History  Problem Relation Age of Onset  . Heart disease Mother   . Diabetes Father    . Diabetes Brother   . Diabetes Sister   . Diabetes Brother   . Diabetes Brother    He indicated that his mother is deceased. He indicated that his father is deceased. He indicated that the status of his sister is unknown. He indicated that one of his four brothers is deceased. He indicated that his maternal grandmother is deceased. He indicated that his maternal grandfather is deceased. He indicated that his paternal grandmother is deceased. He indicated that his paternal grandfather is deceased. He indicated that both of his daughters are alive. He indicated that his son is alive.  Social History    Social History  Socioeconomic History  . Marital status: Married    Spouse name: [name]  . Number of children: 3  . Years of education: 78  . Highest education level: Not on file  Occupational History  . Occupation: Printmaker: [employer]  Tobacco Use  . Smoking status: Former Smoker    Packs/day: 0.50    Years: 4.00    Pack years: 2.00    Types: Cigarettes  . Smokeless tobacco: Never Used  . Tobacco comment: about 40 yrs ago  Substance and Sexual Activity  . Alcohol use: No    Alcohol/week: 0.0 standard drinks  . Drug use: No  . Sexual activity: Yes  Other Topics Concern  . Not on file  Social History Narrative  . Not on file   Social Determinants of Health   Financial Resource Strain:   . Difficulty of Paying Living Expenses:   Food Insecurity:   . Worried About Charity fundraiser in the Last Year:   . Arboriculturist in the Last Year:   Transportation Needs:   . Film/video editor (Medical):   Marland Kitchen Lack of Transportation (Non-Medical):   Physical Activity:   . Days of Exercise per Week:   . Minutes of Exercise per Session:   Stress:   . Feeling of Stress :   Social Connections:   . Frequency of Communication with Friends and Family:   . Frequency of Social Gatherings with Friends and Family:   . Attends Religious Services:     . Active Member of Clubs or Organizations:   . Attends Archivist Meetings:   Marland Kitchen Marital Status:   Intimate Partner Violence:   . Fear of Current or Ex-Partner:   . Emotionally Abused:   Marland Kitchen Physically Abused:   . Sexually Abused:      Review of Systems    General:  No chills, fever, night sweats or weight changes.  Cardiovascular:  No chest pain, dyspnea on exertion, edema, orthopnea, palpitations, paroxysmal nocturnal dyspnea. Dermatological: No rash, lesions/masses Respiratory: No cough, dyspnea Urologic: No hematuria, dysuria Abdominal:   No nausea, vomiting, diarrhea, bright red blood per rectum, melena, or hematemesis Neurologic:  No visual changes, wkns, changes in mental status. All other systems reviewed and are otherwise negative except as noted above.  Physical Exam    VS:  BP 104/60   Pulse 89   Ht '6\' 1"'  (1.854 m)   Wt 221 lb (100.2 kg)   SpO2 98%   BMI 29.16 kg/m  , BMI Body mass index is 29.16 kg/m. GEN: Well nourished, well developed, in no acute distress. HEENT: normal. Neck: Supple, no JVD, carotid bruits, or masses. Cardiac: RRR, no murmurs, rubs, or gallops. No clubbing, cyanosis, edema.  Radials/DP/PT 2+ and equal bilaterally.  Respiratory:  Respirations regular and unlabored, clear to auscultation bilaterally. GI: Soft, nontender, nondistended, BS + x 4. MS: no deformity or atrophy. Skin: warm and dry, no rash. Neuro:  Strength and sensation are intact. Psych: Normal affect.  Accessory Clinical Findings    ECG personally reviewed by me today-normal sinus rhythm nonspecific intraventricular conduction delay 89 bpm- No acute changes  EKG 11/20/2018 Sinus rhythm left bundle branch block 98 bpm  Echocardiogram 11/20/2018 Study Conclusions   - Left ventricle: Systolic function was normal. The estimated  ejection fraction was in the range of 60% to 65%.  - Aortic valve: Mildly calcified leaflets (non coronary leaflet  tip). No  evidence  of vegetation.  - Mitral valve: No evidence of vegetation.  - Left atrium: No evidence of thrombus in the appendage.  - Right atrium: No evidence of thrombus in the atrial cavity or  appendage.  - Atrial septum: No defect or patent foramen ovale was identified.  - Tricuspid valve: No evidence of vegetation.  - Pulmonic valve: No evidence of vegetation.   Assessment & Plan   1.  Congestive dilated cardiomyopathy-echocardiogram 11/19 showed an EF of 60-65%, mildly calcified aortic valve and no other significant abnormalities. Continue current medical therapy Heart healthy low-sodium diet Increase physical activity as tolerated  Peripheral arterial disease-tibial-peroneal trunk angioplasty 5/19 by Dr. Donzetta Matters.  Foot care managed by wound center.  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 Wear well fitting shoes Inspect feet daily  End-stage renal disease-on HD Followed by Dr. Marlaine Hind nephrology  Essential hypertension-BP today 104/60 Continue current medical therapy Heart healthy low-sodium diet Increase physical activity as tolerated  Syncope-no recent syncopal events.  Previously had orthostatic symptoms and collapse.  Was noted after dialysis and on nondialysis days as well. Lower extremity support stockings Stopped midodrine due to side effects.    Disposition: Follow-up with Dr. Stanford Breed in 12 months.  Jossie Ng. Nadean Montanaro NP-C    04/22/2020, 3:42 PM [address] Office [phone] Fax [phone]

## 2020-04-22 ENCOUNTER — Encounter: Payer: Self-pay | Admitting: General Practice

## 2020-04-22 ENCOUNTER — Other Ambulatory Visit: Payer: Self-pay

## 2020-04-22 ENCOUNTER — Ambulatory Visit (INDEPENDENT_AMBULATORY_CARE_PROVIDER_SITE_OTHER): Payer: Medicare Other | Admitting: General Practice

## 2020-04-22 VITALS — BP 104/60 | HR 89 | Ht 73.0 in | Wt 221.0 lb

## 2020-04-22 DIAGNOSIS — R55 Syncope and collapse: Secondary | ICD-10-CM

## 2020-04-22 DIAGNOSIS — N186 End stage renal disease: Secondary | ICD-10-CM

## 2020-04-22 DIAGNOSIS — I739 Peripheral vascular disease, unspecified: Secondary | ICD-10-CM | POA: Diagnosis not present

## 2020-04-22 DIAGNOSIS — I10 Essential (primary) hypertension: Secondary | ICD-10-CM | POA: Diagnosis not present

## 2020-04-22 DIAGNOSIS — I420 Dilated cardiomyopathy: Secondary | ICD-10-CM | POA: Diagnosis not present

## 2020-04-22 DIAGNOSIS — Z992 Dependence on renal dialysis: Secondary | ICD-10-CM

## 2020-04-22 NOTE — Patient Instructions (Signed)
Medication Instructions:  The current medical regimen is effective;  continue present plan and medications as directed. Please refer to the Current Medication list given to you today. *If you need a refill on your cardiac medications before your next appointment, please call your pharmacy*  Special Instructions CONTINUE TO MONITOR WOUNDS ON FEET  Follow-Up: Your next appointment:  12 month(s) Please call our office 2 months in advance to schedule this appointment In Person with You may see Kirk Ruths, MD or one of the following Advanced Practice Providers on your designated Care Team:  Coletta Memos, [place], PA-C  Sande Rives, [place]  At [facility], you and your health needs are our priority.  As part of our continuing mission to provide you with exceptional heart care, we have created designated Provider Care Teams.  These Care Teams include your primary Cardiologist (physician) and Advanced Practice Providers (APPs -  Physician Assistants and Nurse Practitioners) who all work together to provide you with the care you need, when you need it.  We recommend signing up for the patient portal called "MyChart".  Sign up information is provided on this After Visit Summary.  MyChart is used to connect with patients for Virtual Visits (Telemedicine).  Patients are able to view lab/test results, encounter notes, upcoming appointments, etc.  Non-urgent messages can be sent to your provider as well.   To learn more about what you can do with MyChart, go to [email or web address].

## 2020-04-24 ENCOUNTER — Encounter (HOSPITAL_BASED_OUTPATIENT_CLINIC_OR_DEPARTMENT_OTHER): Payer: Medicare Other | Admitting: Internal Medicine

## 2020-04-24 DIAGNOSIS — E11621 Type 2 diabetes mellitus with foot ulcer: Secondary | ICD-10-CM | POA: Diagnosis not present

## 2020-04-24 NOTE — Progress Notes (Signed)
ORLANDIS, SANDEN ([id number]) Visit Report for 04/24/2020 HPI Details Patient Name: Date of Service: HASKELL, RIHN 04/24/2020 1:00 PM Medical Record Number: [id number] Patient Account Number: [id number] Date of Birth/Sex: Treating RN: 01/06/1951 (69 y.o. [name] Primary Care Provider: [name] Other Clinician: Referring Provider: Treating Provider/Extender: Tanna Savoy in Treatment: 45 History of Present Illness Location: Patient presents with a wound to bilateral feet. Quality: Patient reports experiencing essentially no pain. Severity: Mildly severe wound with no evidence of infection Duration: Patient has had the wound for greater than 2 weeks prior to presenting for treatment HPI Description: The patient is a pleasant 69 yrs old bm here for evaluation of ulcers on the plantar aspect of both feet. 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 He has a left arm graft for access. He has been trying to stay off his feet for weeks but does not seem to have any improvement in the wound. He has been seeing someone at the foot center and was referred to the Wound Care center for further evaluation. 12/30/15 the patient has 3 wounds one over the right first metatarsal head and 2 on the left foot at the left fifth and lleft first metatarsal head. All of these look relatively similar. The one over the left fifth probe to bone I could not prove that any of the others did. I note his MRI in November that did not show osteomyelitis. His peripheral pulses seem robust. All of these underwent surgical debridement to remove callus nonviable skin and subcutaneous tissue 01/06/16; the patient had his sutures removed from the right fifth ray amputation. There may be a small open part of this superiorly but otherwise the incision looks good. Areas over his right first, left first and left fifth metatarsal head all  underwent surgical debridement as a varying degree of callus, skin and nonviable subcutaneous tissue. The area that is most worrisome is the right fifth metatarsal head which has a wound probes precariously close to bone. There is no purulent drainage or erythema 01/13/16; I'm not exactly sure of the status of the right fifth ray amputation site however he follows with Dr. [name] later this week. The area over his right first plantar metatarsal head, left first and fifth plantar metatarsal head are all in the same status. Thick circumferential callus, nonviable subcutaneous tissue. Culture of the left fifth did not culture last week 01/20/16; all of the patient's wounds appear and roughly the same state although his amputation site on the right lateral foot looks better. His wounds over the right first, left first and left fifth metatarsal heads all underwent difficult surgical debridement removing circumferential callus nonviable skin and subcutaneous tissue. There is no overt evidence of infection in these areas. MRI at the end of December of the left foot did not show osteomyelitis, right foot showed osteomyelitis of the right fifth digit he is now status post amputation. 01/27/16 the patient's wounds over his plantar first and fifth metatarsal heads on the left all appear better having been started on a total contact cast last week. They were debridement of circumferential callus and nonviable subcutaneous tissue as was the wound over the first metatarsal head. His surgical incision on the right also had some light surface debridement done. 03/23/2016 -- the patient was doing really well and most of his wounds had almost completely healed but now he came back today with a history of having  a discharge from the area of his right foot on the plantar aspect and also between his first and second toe. He also has had some discharge from the left foot. Addendum: I spoke to the PA [name] at the  dialysis center whose fax number is [phone]. We discussed the infection the patient has and she will put the patient on vancomycin and Fortaz until the final culture report is back. We will fax this as soon as available. 03/30/2016 -- left foot x-ray IMPRESSION:No definitive osteomyelitis noted. X-ray of the right foot -- IMPRESSION: 1. Soft tissue swelling. Prior amputation right fifth digit. No acute or focal bony abnormality identified. If osteomyelitis remains a clinical concern, MRI can be obtained. 2. Peripheral vascular disease. His culture reports have grown an MSSA -- and we will fax this report to his hemodialysis center. He was called in a prescription of oral doxycycline but I have told her not to fill this in as he is already on IV antibiotics. 04/06/2016 -- a few days ago, I spoke to the hemodialysis center nurse who had stopped the IV antibiotics and he was given a prescription for doxycycline 100 mg by mouth twice a day for a week and he is on this at the present time. 05/11/2016 -- he has recently seen his PCP this week and his hemoglobin A1c was 7. He is working on his paperwork to get his orthotic shoes. 05/25/2016 -- -- x-ray of the left foot IMPRESSION:No acute bony abnormality. No radiographic changes of acute osteomyelitis. No change since prior study. X-ray of the right foot -- IMPRESSION: Postsurgical changes are seen involving the fifth toe. No evidence of acute osteomyelitis. he developed a large blister on the medial part of his right foot and this opened out and drain fluid. 06/01/2016 -- he has his MRI to be done this afternoon. 06/15/2016 -- MRI of the left forefoot without contrast shows 2 separate regions of cutaneous and subcutaneous edema and possible ulceration and blistering along the ball of the foot. No obvious osteomyelitis identified. MRI of the right foot showed cutaneous and subcutaneous thickening plantar to the first digit sesamoid with an ulcer  crater but no underlying osteomyelitis is identified. 06/22/2016 -- the right foot plantar ulcer has been draining a lot of seropurulent material for the last few days. 06/30/2016 -- spoke to the dialysis center and I believe I spoke to Pax a PA at the center who discussed with me and agreed to putting Legrand Como on vancomycin until his cultures arrive. On review of his culture report no WBCs were seen or no organisms were seen and the culture was reincubated for better growth. The final report is back and there were no predominant growth including Streptococcus or Staphylococcus. Clinically though he has a lot of drainage from both wounds a lot of undermining and there is further blebs on the left foot towards the interspace between his first and second toe. 08/10/2016 -- the culture from the right foot showed normal skin flora and there was no Staphylococcus aureus was group A streptococcus isolated. 09/21/2016 -- -- MRI of the right foot was done on 09/13/2016 - IMPRESSION: Findings most consistent with acute osteomyelitis throughout the great toe, sesamoid bones and plantar aspect of the head of the first metatarsal. Fluid in the sheath of the flexor tendon of the great toe could be sympathetic but is worrisome for septic tenosynovitis. First MTP joint effusion worrisome for septic joint. He was admitted to the hospital on 09/11/2016  and treated for a fever with vancomycin and cefepime. He was seen by Dr. Jeffrey Hatcher of infectious disease who recommended 6 weeks treatment with vancomycin and ceftazidime with his hemodialysis and to continue to see as in the wound clinic. Vascular consult was pending. The patient was discharged home on 09/14/2016 and he would continue with IV antibiotics for 6 weeks. 09/28/16 wound appears reasonably healthy. Continuing with total contact cast 10/05/16 wound is smaller and looking healthy. Continue with total contact cast. He continues on IV  antibiotics 10/12/2016 -- he has developed a new wound on the dorsal aspect of his left big toe and this is a superficial injury with no surrounding cellulitis. He has completed 30 days of IV antibiotics and is now ready to start his hyperbaric oxygen therapy as per his insurance company's recommendation. 10/19/2016 -- after the cast was removed on the right side he has got good resolution of his ulceration on the right plantar foot. he has a new wound on the left plantar foot in the region of his fourth metatarsal and this will need sharp debridement. 10/26/2016 -- Xray of the right foot complete: IMPRESSION: Changes consistent with osteomyelitis involving the head of the first metatarsal and base of the first proximal phalanx. The sesamoid bones are also likely involved given their positioning. 11/02/2016 -- there still awaiting insurance clearance for his hyperbaric oxygen therapy and hopefully he will begin treatment soon. 11/09/2016 -- started with hyperbaric oxygen therapy and had some barotrauma to the right ear and this was seen by ENT who was prescribed Afrin drops and would probably continue with HBO and he is scheduled for myringotomy tubes on Friday 11/16/2016 -- he had his myringotomy tubes placed on Friday and has been doing much better after that with some fluid draining out after hyperbaric treatment today. The pain was much better. 11/30/2016 -- over the last 2 days he noticed a swelling and change of color of his right second toe and this had been draining minimal fluid. 12/07/2016 -- x-ray of the right foot -- IMPRESSION:1. Progressive ulceration at the distal aspect of the second digit with significant soft tissue swelling and osseous changes in the distal phalanx compatible with osteomyelitis. 2. Chronic osteomyelitis at the first MTP joint. 3. Ulcerations at the second and third toes as well without definite osseous changes. Osteomyelitis is not excluded. 4. Fifth digit  amputation. On 12/01/2016 I spoke to Dr. Jeffrey Hatcher, the infectious disease specialist, who kindly agreed to treat this with IV antibiotics and he would call in the order to the dialysis center and this has been discussed in detail with the patient who will make the appropriate arrangements. The patient will also book an appointment as soon as possible to see Dr. Hatcher in the office. Of note the patient has not been on antibiotics this entire week as the dialysis center did not receive any orders from Dr. Hatcher. I got in touch with Dr. Hatcher who tells me the patient has an appointment to see him this coming Wednesday. 12/14/2016 -- the patient is on ceftazidime and vancomycin during his dialysis, and I understand this was put on by his nephrologist Dr. K possibly after speaking with infectious disease Dr. Hatcher 12/23/16; patient was on my schedule today for a wound evaluation as he had difficulties with his schedule earlier this week. I note that he is on vancomycin and ceftazidine at dialysis. In spite of this he arrives today with a new wound on the base of the   right second toe this easily probes to bone. The known wound at the tip of the second toe With this area. He also has a superficial area on the medial aspect of the third toe on the side of the DIP. This does not appear to have much depth. The area on the plantar left foot is a deep area but did not probe the bone 12/28/16 -- he has brought in some lab work and the most recent labs done showed a hemoglobin of 12.1 hematocrit of 36.3, neutrophils of 51% WBC count of 5.6, BN of 53, albumin of 4.1 globulin of 3.7, vancomycin 13 g per mL. 01/04/2017 -- he saw Dr. [name] who has recommended a amputation of the right second toe and he is awaiting this date. He sees Dr. Bobby Rumpf of infectious disease tomorrow. The bone culture taken on 12/28/2016 had no growth in 2 days. 01/11/2017 -- was seen by Dr. Bobby Rumpf regarding the  management and has recommended a eval by vascular surgeons. He recommended to continue the antibiotics during his hemodialysis. Xray of the left foot -- IMPRESSION: No acute fracture, dislocation, or osseous erosion identified. 01/18/2017 -- he has his vascular workup later today and he is going to have his right foot second toe amputation this coming Friday by Dr. [name]. We have also put in for a another 30 treatments with hyperbaric oxygen therapy 01/25/2017 -- I have reviewed Dr. Donnetta Hutching is vascular report from last week where he reviewed him and thought his vascular function was good enough to heal his amputation site and no further tests were recommended. He also had his orthopedic related to surgery which is still pending the notes and we will review these next week. 02/01/2017 -- the operative remote of Dr. 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 02/08/2017 --he was seen by Dr. [name] on 02/03/2017 -- patient is on Jane. after a thorough review he had recommended to continue with antibiotics during hemodialysis. The patient was seen by Dr. [name], but I do not find any follow-up note on the electronic medical record. he has removed the dressing over the right foot to remove the sutures and asked him to see him back in a month's time 02/22/2017 -- patient has been febrile and has been having symptoms of the upper respiratory tract infection but has not been checked for the flu and it's been over 4 days now. He says he is feeling better today. Some drainage between his left first and second toe and this needed to be looked  at 03/08/2017 -- was seen by Dr. Bobby Rumpf on 03/07/2017 after review he will stop his antibiotics and see him back in a month to see if he is feeling well. 03/15/2017 -- he has completed his course of hyperbaric oxygen therapy and is doing fine with his health otherwise. 03/22/2017 -- his nutritionist at the dialysis center has recommended a protein supplement to help build his collagen and we will prescribe this for him when he has the details 05/03/2017 -- he recently noticed the area on the plantar aspect of his fifth metatarsal head which opened out and had minimal drainage. 05/10/2017 -- -- x-ray of  the left foot -- IMPRESSION:1. No convincing conventional radiographic evidence of active osteomyelitis.2. Active soft tissue ulceration at the tip of the fifth digit, and at the lateral aspect of the foot adjacent the base of the fifth metatarsal. 3. Surgical changes of prior fourth toe amputation. 4. 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 5. Small vessel atherosclerotic vascular calcifications. 6. 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 ======= Readmission after 5 weeks: 06/15/2017 -- the patient returns after 5 weeks having had an MRI done on 05/17/2017 MRI of the left foot without contrast showed soft tissue ulcer overlying the base of the fifth metatarsal. 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 No drainable fluid collection to suggest an abscess. He was in hospital between 05/16/2017 and 05/25/2017 -- and on discharge was asked to follow-up with Dr. Graves and Dr. Hatcher. He was treated 2 weeks post discharge with vancomycin plus ceftriaxone with hemodialysis and oral metronidazole 500 mg 3 times a day. 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 last hemoglobin A1c was  6.7% He had a postoperative MR of the foot on 05/23/2017 -- which showed no new areas of cortical bone loss, edema or soft tissue ulceration to suggest osteomyelitis. the patient has completed his course of IV antibiotics during dialysis and is to see the infectious disease doctor tomorrow. Dr. Graves had seen him after suture removal and asked him to keep the wound with a dry dressing 06/21/2017 -- he was seen by Dr. Jeffrey Hatcher of infectious disease on 06/16/2017 -- he stopped his Flagyl and will see him back in 6 weeks. He was asked to follow-up with Dr. Graves and with the wound clinic clinic. 07/05/17; bone biopsy from last time showed acute osteomyelitis. This is from the reminiscent in the left fifth metatarsal. Culture result is apparently still pending [holding for anaerobe}. From my understanding in this case this is been a progressive necrotic wound which is deteriorated markedly over the last 3 weeks since he returned here. He now has a large area of exposed bone which was biopsied and cultured last week. Dr. Snyder has put him on vancomycin and Fortaz during his hemodialysis and Flagyl orally. He is to see Dr. Graves next week 07/19/2017 -- the patient was reviewed by Dr. Graves of orthopedics who reviewed the case in detail and agreed with the plan to continue with IV antibiotics, aggressive wound care and hyperbaric oxygen therapy. He would see him back in 3 weeks' time 08/09/2017 -- saw Dr. Jeffrey Hatcher on 08/03/2017 -he was restarted on his antibiotics for 6 weeks which included vanco, ceftaz and Flagyl. he recommended continuing his antibiotics for 6 weeks and reevaluate his completion date. He would continue with wound care and hyperbaric oxygen therapy. 08/16/2017 -- I understand he will be completing his 6 weeks of antibiotics sometime later this week. 09/19/2017 -- he has been without his wound VAC for the last week due to lack of supplies. He has been packing his wound  with silver alginate. 10/04/2017 -- he has an appointment with Dr. Hatcher tomorrow and hence we will not apply the wound VAC after his dressing changes today. 10/11/2017 -- he was seen by Dr. Jeffrey Hatcher on 10/05/2017, noted that the patient was currently off antibiotics, and after thorough review he recommended he follow-up with the wound care as   per plan and no further antibiotics at this point. 11/29/17; patient is arrived for the wound on his left lateral foot.. He is completed IV antibiotics and 2 rounds of hyperbaric oxygen for treatment of underlying osteomyelitis. He arrives today with a surface on most of the wound area however our intake nurse noted drainage from the superior aspect. This was brought to my attention. 12/13/2017 -- the right plantar foot had a large bleb and once the bleb was opened out a large callus and subcutaneous debris was removed and he has a plantar ulcer near the fourth metatarsal head 12/28/17 on evaluation today patient appears to be doing acutely worse in regard to his left foot. The wound which has been appearing to do better as now open up more deeply there is bone palpable at the base of the wound unfortunately. He tells me that this feels "like it did when he had osteomyelitis previously" he also noted that his second toe on the left foot appears to be doing worse and is swollen there does appear to be some fluid collected underneath. His right foot plantar ulcer appears to be doing somewhat better at this point and there really is no complication at the site currently. No fevers, chills, nausea, or vomiting noted at this time. Patient states that he normally has no pain at this site however. T oday he is having significant pain. 01/03/18; culture done last week showed methicillin sensitive staph aureus and group B strep. He was on Septra however he arrives today with a fever of 101. He has a functional dialysis shunt in his left arm but has had no pain here.  No cough. He still makes some urine no dysuria he did have abdominal pain nausea and diarrhea over the weekend but is not had any diarrhea since yesterday. Otherwise he has no specific complaints 01/05/18; the patient returns today in follow-up for his presentation from 1/8. At that point he was febrile. I gave him some Levaquin adjusted for his dialysis status. He tells me the fever broke that night and it is not likely that this was actually a wound infection. He is gone on to have an MRI of the left foot; this showed interval development of an abnormal signal from the base of the third and fourth metatarsal and in the cuboid reminiscent consistent with osteomyelitis. There is no mention of the left second toe I think which was a concern when it was ordered. The patient is taking his Levaquin 01/10/18; the patient has completed his antibiotics today. The area on the left lateral foot is smaller but it still probes easily to bone. He has underlying osteomyelitis here and sees Dr. Campbell of infectious disease this week The area on the right third plantar metatarsal head still requires debridement not much change in dimensions He has a new wound on the medial tip of his right third toe again this probes to bone. He only noticed this 2 days ago 01/17/18; the patient saw Dr. Hatcher last week and he is back on Vanco and Fortaz at dialysis. 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 We are previously seeing him for a wound also on the base of the fourth med head on the right. X-ray of this area did not show osteomyelitis in relation to a new wound on the tip of his right third toe. Again this probes   to bone. Finally his second toe on the left which didn't really show anything on the MRI at least no report appears to have a separated cutaneous area which I think is going to come right off the tip  of his toe and leave exposed bone. Whether this is infectious or ischemic I am not clear Culture I did from the third toe last week which was his new wound was negative. Plain x-ray on the right foot did not show osteomyelitis in the toes 01/24/18; the patient is going went to see Dr. Graves. He is going to have a amputation of the left second and right third toe although paradoxically the left second toe looks better than last week. We have treating a deep probing wound on the left lateral foot and the area over the third metatarsal head on the right foot. 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 Also a debridement including bone of the left lateral foot and a closure which is still sutured. This is a bit surprising. Also apparent debridement of the base of the right third toe wound. 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 I'm not sure exactly where we are in the frame of things here. The wound on the left lateral foot is still sutured also the amputation sites of the left second and right third toe. He is been using Xeroform to the sutured areas silver alginate to the right foot 02/14/18; he continues on Rocephin at dialysis for osteomyelitis. I still don't have a good sense of where we are in the treatment duration. The wounds on the left lateral foot had the sutures removed and this clearly still probes deeply. We debrided the area with a #5 curet. We'll use silver alginate to the wound the area over the right third met head also required debridement of callus skin and subcutaneous tissue and necrotic debris over the wound surface. This tunnels superiorly but I did not unroofed this today. The areas on the tip of his toes have a crusted surface eschar I did not  debridement this either. 02/21/18; he continues on Rocephin at dialysis for osteomyelitis. I don't have a good sense of where we are and the treatment duration. The wounds on the left lateral foot is callused over on presentation but requires debridement. The area on the right third med head plantar aspect actually is measuring smaller 02/28/18;patient is on Vanco and Fortaz at dialysis, I'm not sure why I thought this was Rocephin above unless it is been changed. I don't have a good sense of time frame here. Using silver collagen to the area on the left lateral foot and right third med head plantar foot 03/07/18; patient is completing bank and Fortaz at dialysis soon. He is using Silver collagen to the area on the left lateral foot and the right third metatarsal head. He has a smaller superficial wound distal to the left lateral foot wound. 03/14/18-he is here in follow-up evaluation for multiple ulcerations to his bilateral feet. He presents with a new ulceration to the plantar aspect of the left foot underneath the blister, this was deroofed to reveal a partial thickness ulcer. He is voicing no complaints or concerns, tolerated dialysis yesterday. We will continue with same treatment plan and he will   follow-up next week 03/21/18; the patient has a area on the lateral left foot which still has a small probing area. The overall surface area of the wound is better. He presented with a new ulceration on the left foot plantar fifth metatarsal head last week. The area on the right third metatarsal head appears smaller.using silver alginate to all wound areas. The patient is changing the dressing himself. He is using a [place] forefoot offloading are on the right and a healing sandal on the left 03/28/18; original wound left lateral foot. Still nowhere close to looking like a heeling surface secondary wound on the left lateral foot at the base of his question fifth metatarsal which was new last week Right third  metatarsal head. We have been using silver alginate all wounds 04/04/18; the original wound on the left lateral foot again heavy callus surrounding thick nonviable subcutaneous tissue all requiring debridement. The new wound from 2 weeks ago just near this at the base of the fifth metatarsal head still looks about the same Unfortunately there is been deterioration in the third medical head wound on the right which now probes to bone. I must say this was a superficial wound at one point in time and I really don't have a good frame of reference year. I'll have to go back to look through records about what we know about the right foot. 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 He is also been to see Dr. Graves. Somewhere in here as somebody is ordered a VASCULAR evaluation which was done on 03/22/18. On the right is anterior tibial artery was monophasic triphasic at the posterior tibial artery. On the left anterior tibial artery monophasic posterior tibial artery monophasic. ABI in the right was 1.43 on the left 1.21. TBIs on the right at 0.41 and on the left at 0.32. A vascular consult was recommended and I think has been arranged. 04/11/18; Mr. Hlavaty has not had an MRI of the right foot since 2017. Recent x-ray of the right foot done in January and February was negative. However he has had a major deterioration in the wound over the third met head. He is completed antibiotics last week at dialysis Vanco and Fortaz. I think he is going to see vascular in follow-up. The areas on the left lateral foot and left plantar fifth metatarsal head both look satisfactory. I debrided both of these areas although the tissue here looks good. The area on the left lateral foot once probe to bone it certainly does not do that now 04/18/18; MRI of the right foot is on Thursday. He has what looks to be serosanguineous purulent  drainage coming out of the wound over the right third metatarsal head today. He completed antibiotics bank and Fortaz 2 weeks ago at dialysis. He has a vascular follow-up with regards to his arterial insufficiency although I don't exactly see when that is booked. The areas on the left foot including the lateral left foot and the plantar left fifth metatarsal head look about the same. 04/25/18-He is here in follow-up evaluation for bilateral foot ulcers. MRI obtained was negative for osteomyelitis. Wound culture was negative. We will continue with same treatment plan he'll follow-up next week 05/02/18; the right plantar foot wound over the third metatarsal head actually looks better than when I last saw this. His MRI was negative for osteomyelitis wound culture was negative. On the left plantar   foot both wounds on the plantar fifth metatarsal head and on the lateral foot both are covered in a very hard circumferential callus. 05/09/18; right plantar foot wound over the third metatarsal head stable from last week. Left plantar foot wound over the fifth metatarsal head also stable but with callus around both wound areas The area on the left lateral foot had thick callus over a surface and I had some thoughts about leaving this intact however it felt boggy. We've been using silver alginate all wounds 05/16/18; since the patient was last here he was hospitalized from 5/15 through 5/19. 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 This grew MRSA. He was placed on vancomycin.MRI of the left foot suggested "progressive" osteomyelitis and bone destruction of the cuboid and the base of the fifth metatarsal. Stable erosive changes of the base of the second metatarsal. I'm wondering if they're aware that he had surgery and debridement in the area of the underlying bone previously by Dr. Graves. In any case, He was  also revascularized with an angioplasty and stenting of the left tibial peroneal trunk and angioplasty of the left posterior tibial artery. He is on vancomycin at dialysis. He has a 2 week follow-up with infectious disease. He has vascular surgery follow-up. He was started on Plavix. 05/23/18; the patient's wound on the lateral left foot at the level of the fifth metatarsal head and the plantar wound on the plantar fifth metatarsal head both look better. The area on the right third metatarsal head still has depth and undermining. We've been using silver alginate. He is on vancomycin. He has been revascularized on the left 05/30/18; he continues on vancomycin at dialysis. Revascularized on the left. The area on the left lateral foot is just about closed. Unfortunately the area over the plantar fifth metatarsal head undermining superiorly as does the area over the right third metatarsal head. Both of these significantly deteriorated from last week. He has appointments next week with infectious disease and orthopedics I delayed putting on a cast on the left until those appointments which therefore we made we'll bring him in on Friday the 14th with the idea of a cast on the left foot 06/06/18; he continues on vancomycin at dialysis. Revascularized on the left. He arrives today with a ballotable swelling just above the area on the left lateral foot where his previous wound was. He has no pain but he is reasonably insensate. The difficult areas on the plantar left fifth metatarsal head looks stable whereas the area on the right third plantar metatarsal head about the same as last week there is undermining here although I did not unroofed this today. He required a considerable debridement of the swelling on the left lateral foot area and I unroofed an abscess with a copious amount of brown purulent material which I obtained for culture. I don't believe during his recent hospitalization he had any further imaging  although I need to review this. Previous cultures from this area done in this clinic showed MRSA, I would be surprised if this is not what this is currently even though he is on vancomycin. 06/13/18; he continues on vancomycin at dialysis however he finishes this on Sunday and then graduates the doxycycline previously prescribed by Dr. Hatcher. The abscess site on the lateral foot that I unroofed last time grew moderate amounts of methicillin-resistant staph aureus that is both   vancomycin and tetracycline sensitive so we should be okay from that regard. He arrives today in clinic with a connection between the abscess site and the area on the lateral foot. We've been using silver alginate to all his wound areas. The right third metatarsal head wound is measuring smaller. Using silver alginate on both wound areas 06/20/18; transitioning to doxycycline prescribed by Dr. Hatcher. The abscess site on the lateral foot that I unroofed 2 weeks ago grew MRSA. That area has largely closed down although the lateral part of his wound on the fifth metatarsal head still probes to bone. I suspect all of this was connected. On the right third metatarsal head smaller looking wound but surrounded by nonviable tissue that once again requires debridement using silver alginate on both areas 06/27/18; on doxycycline 100 twice a day. The abscess on the lateral foot has closed down. Although he still has the wound on the left fifth metatarsal head that extends towards the lateral part of the foot. The most lateral part of this wound probes to bone Right third metatarsal head still a deep probing wound with undermining. Nevertheless I elected not to debridement this this week If everything is copious static next week on the left I'm going to attempt a total contact cast 07/04/18; he is tolerating his doxycycline. The abscess on the left lateral foot is closed down although there is still a deep wound here that probes to bone. We  will use silver collagen under the total contact cast Silver collagen to the deep wound over the right third metatarsal head is well 07/11/18; he is tolerating doxycycline. The left lateral fifth plantar metatarsal head still probes deeply but I could not probe any bone. We've been using silver collagen and this will be the second week under the total contact cast Silver collagen to deep wound over the right third metatarsal head as well 07/18/18; he is still taking doxycycline. The left lateral fifth plantar metatarsal this week probes deeply with a large amount of exposed bone. Quite a deterioration. He required extensive debridement. Specimens of bone for pathology and CNS obtained Also considerable debridement on the right third metatarsal head 07/25/18; bone for pathology last week from the lateral left foot showed osteomyelitis. CandS of the bone Enterobacter. And Klebsiella. He is now on ciprofloxacin and the doxycycline is stopped [Dr. Hatcher of infectious disease] area He has an appointment with Dr. Hatcher tomorrow I'm going to leave the total contact cast off for this week. May wish to try to reapply that next week or the week after depending on the wound bed looks. I'm not sure if there is an operative option here, previously is followed with Dr. Graves 08/01/18 on evaluation today patient presents for reevaluation. He has been seen by infectious disease, Dr. Hatcher, and he has placed the patient back on Ceftaz currently to be given to him at dialysis three times a week. 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 Fortunately there does not appear to be the evidence of infection at this point in time. Overall the patient has been tolerating the dressing changes without complication. We are no longer due lives in the cast of left foot secondary to the infection obviously. 08/10/18 on evaluation today patient appears to be  doing rather well all things considered in regard to his left plantar foot ulcer. He does have a significant callous on the lateral portion of his left   foot he wonders if I can help clean that away to some degree today. Fortunately he is having no evidence of infection at this time which is good news. No fevers chills noted. With that being said his right plantar foot actually does have a significant callous buildup around the wound opening this seems to not be doing as well as I would like at this point. I do believe that he would benefit from sharp debridement at this site. Subsequently I think a total contact cast would be helpful for the right foot as well. 08/17/18; the patient arrived today with a total contact cast on the right foot. This actually looks quite good. He had gone to see Dr. Megan Salon of infectious disease about the osteomyelitis on the left foot. I think he is on IV Fortaz at dialysis although I am not exactly sure of the rationale for the Fortaz at the time of this dictation. He also arrived in today with a swelling on the lateral left foot this is the site of his original wounds in fact when I first saw this man when he was under Dr. Ardeen Garland care I think this was the site of where the wound was located. It was not particularly tender however using a small scalpel I opened it to [place] some moderate amount of purulent drainage. 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 Last MRI was in May that showed progressive osteomyelitis and bone destruction of the cuboid and base of the fifth metatarsal. Last week he had an abscess over this same area this was removed. Culture was negative. We are continuing with a total contact cast to the area on the third metatarsal head on the right and making some good progress here. The patient asked me again about renal  transplant. He is not on the list because of the open wounds. 08/31/18; apparently the patient had an interruption in the [place] but he is now back on this at dialysis. Apparently there was a misunderstanding and Dr. Crissie Figures orders. Due to have the MRI of the left foot tonight. The area on the right foot continues to have callus thick skin and subcutaneous tissue around the wound edge that requires constant debridement however the wound is smaller we are using a total contact cast in this area. Alginate all wounds 09/07/18; without much surprise the MRI of his left foot showed osteomyelitis in the reminiscent of his fourth metatarsal but also the third metatarsal. She arrives in clinic today with the right foot and a total contact cast. There was purulent drainage coming out of the wound which I have cultured. Marked deterioration here with undermining widely around the wound orifice. Using pickups and a scalpel I remove callus and subcutaneous tissue from a substantial new opening. In a similar fashion the area on the left lateral foot that was blistered last week I have opened this and remove skin and subcutaneous tissue from this area to expose a obvious new wound. I think there is extension and communication between all of this on the left foot. The patient is on Fortaz at dialysis. Culture done of the right foot. We are clearly not making progress here. We have made him an appointment with Dr. [name] of orthopedics. I think an amputation of the left leg may be discussed. I don't think there is anything that can be done with foot salvage.  09/14/18; culture from the right foot last time showed a very resistant MRSA. This is resistant to doxycycline. I'm going to try to get linezolid at least for a week. He does not have a appointment with Dr. Graves yet. 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 He is finished Fortaz at dialysis. I'll send a message to Dr. Hatcher of  infectious disease. I want the patient to see Dr. Graves to go over the pros and cons of an amputation which I think will be a BKA. The patient had a question about whether this is curative or not. I told him that I thought it would be although spread of staph aureus infection is not unheard of. MRI of the right foot was done at the end of April 2019 did not show osteomyelitis. The patient last saw Dr. Hatcher on 9/3. I'll send Dr. Hatcher a message. I would really like him to weigh the pros and cons of a BKA on the left otherwise he'll probably need IV antibiotics and perhaps hyperbaric oxygen again. He has not had a good response to this in the past. His MRI earlier this month showed progressive damage in the remnants of the fourth and third metatarsal 09/21/2018; sees Dr. Graves of orthopedics next week to discuss a left BKA in response to the osteomyelitis in the left foot. Using silver alginate to all wound areas. He is completing the Zyvox I did put in for him after last culture showed MRSA 09/29/2018; patient saw Dr. Graves of orthopedics discussed the osteomyelitis in the left foot third and fourth metatarsals. He did not recommend urgent surgery but certainly stated the only surgical option would be a BKA. He is communicating with Dr. Hatcher. The problem here is the instability of the areas on his foot which constantly generate draining abscesses. I will communicate with Dr. Hatcher about this. He has an appointment on 10/23 10/05/2018; patient sees Dr. Hatcher on 10/23. I have sent him a secure message not ordered any additional antibiotics for now. Patient continues to have 2 open areas on the left plantar foot at the fifth metatarsal head and the lateral aspect of the foot. These have not changed all that much. The area on the right third met head still has thick callus and surrounding subcutaneous tissue we have been using silver alginate 10/12/2018; patient sees Dr. Hatcher or colleague on  10/23. 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 The area on the right plantar foot is just about closed. The superior wound on the left fifth metatarsal head is callused over but I am not sure if this is closed. The area below it is about the same. Considerable amount of callus on the lateral foot. We have been using silver alginate to the wounds 10/19/2018; the patient saw Dr. Hatcher on 10/22. He wishes to try and continue to save the left foot. He has been given 6 weeks of oral ciprofloxacin. We continue to put a total contact cast on the right foot. The area over the fifth metatarsal head on the left is callused over/perhaps healed but I did not remove the callus to find out. He still has the wound on the left lateral midfoot requiring debridement. We have been using silver alginate to all wound areas 10/26/2018 On the left foot our intake nurse noted some purulent drainage from the inferior wound [currently the only one that is not callused   over] On the right foot even though he is in total contact cast a considerable amount of thick black callus and surface eschar. On this side we have been using silver alginate under a total contact cast. he remains on ciprofloxacin as prescribed by Dr. Hatcher 11/02/2018; culture from last week which is done of the probing area on the left midfoot wound showed MRSA "few". I am going to need to contact Dr. Hatcher which I will do today I think he is going to need IV antibiotics again. The area on the left foot which was callused on the side is clearly separating today all of this was removed a copious amounts of callus and necrotic subcutaneous tissue. The area on the right plantar foot actually remained healthy looking with a healthy granulated base. We are using silver alginate to all wound areas 11/09/2018; unfortunately neither 1 of the patient's wounds areas looks at all satisfactory. On  the left he has considerable necrotic debris from the plantar wound laterally over the foot. I removed copious amounts of material including callus skin and subcutaneous tissue. On the right foot he arrives with undermining and frankly purulent drainage. Specimen obtained for culture and debridement of the callused skin and subcutaneous tissue from around the circumference. Because of this I cannot put him back in a total contact cast but to be truthful we are really unfortunately not making a lot of progress I did put in a secure text message to Dr. Hatcher wondering about the MRSA on the left foot. He suggested vancomycin although this is not started. I was left wondering if he expected me to send this into dialysis. Has we have more purulence on the right side wait for that result before calling dialysis 11/16/2018; I called dialysis earlier this week to get the vancomycin started. I think he got the first dose on Tuesday. The patient tells me that he fell earlier this week twisting his left foot and ankle and he is swelling. He continues to not look well. He had blood cultures done at dialysis on Tuesday he is not been informed of the results. 12/07/2018; the patient was admitted to hospital from 11/22/2018 through 12/02/2018. He underwent a left BKA. Apparently had staph sepsis. In the meantime being off the right foot this is closed over. He follows up with Dr. 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 Subsequent to this he actually had multiple falls in fact he tells me to following which calls the wound to be his apparently. He has been seeing Dr. Graves and his physician assistant in the interim. They actually did want him to come back for reevaluation to see if there's anything we can do for me wound care perspective the help this area to heal more appropriately. The patient  states that he does not have a tremendous amount of pain which is good news. No fevers, chills, nausea, or vomiting noted at this time. We have gotten approval from Dr. Graves office had [facility] for us to treat the patient for his stop wound. This is due to the fact that the patient was in the 90 day postop global. 1/21; the patient does not have an open wound on the right foot although he does have some pressure areas that will need to be padded when he is transferring. The dehisced surgical wound looks clean although there is some undermining of 1.5 cm   superiorly. 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 This appears healthy but there is undermining. TheraSkin #1 2/11; TheraSkin #2 2/25; TheraSkin #3. Wound is measuring smaller 3/10; TheraSkin #4 wound is measuring smaller 3/24; TheraSkin #5 wound is measuring smaller and looks healthy. 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 He has no open area on the right foot although he does have some callused area over the original third metatarsal head wound. The third metatarsal is also subluxed on the right foot. I have warned him today that he cannot consider wearing a prosthesis for at least a month but he can go for measurements. He is going to need to keep the right foot padded is much as possible in his diabetic shoe indefinitely READMISSION 06/12/2019 Mr. Lukasiewicz is a type II diabetic on dialysis. He has been in this clinic multiple times with wounds on his bilateral lower extremities. 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 He also has a history of wounds on his right foot but tells me that everything is going well here. He actually came in here with his prosthesis walking with a cane. We were all really quite gratified to see  this He states over the last several weeks he has had a painful area at the tip of the left third finger. His dialysis shunt in his is in the left upper arm and this particularly hurts during dialysis when his fingers get numb and there is a lot of pain in the tip of his left third finger. I believe he is seen vascular surgery. He had a Doppler done to evaluate for a dialysis steal syndrome. He is going for a banding procedure 1 week from today towards the left AV fistula. I think if that is unsuccessful they will be looking at creating a new shunt. He had a course of doxycycline when he which he finished about a week ago. He has been using Bactroban to the finger 6/25; the patient had his banding procedure and things seem to be going better. He is not having pain in his hand at dialysis. The area on the tip of his finger seems about closed. He did however have a paronychia I the last time I saw him. I have been advising him to use topical Bactroban washing the finger. Culture I did last time showed a few staph epidermidis which I was willing to dismiss as superficial skin contaminant. He arrives today with the finger wound looking better however the paronychia a seems worse 7/9; 2-week follow-up. He does not have an open wound on the tip of his third left finger. I thought he had an initial ischemic wound on the tip of the finger as well as a paronychia a medially. All of this appears to be closed. 8/6-Patient returns after 3 weeks for follow-up and has a right second met head plantar callus with a significant area of maceration hiding an ulcer ABI repeated today on right - 1.26 8/13-Patient returns at 1 week, the right second met head plantar wound appears to be slightly better, it is surrounded by the callus area we are using silver alginate 8/20; the patient came back to clinic 2 weeks ago with a wound on the right second metatarsal head. He apparently told me   he developed callus in this area  but also went away from his custom shoes to a pair of running shoes. Using silver alginate. 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 There might be much we can do to offload this although the patient tells me he has a wheelchair that he is using to try and stay off the wound is much as possible 8/27; right second metatarsal head. Still small punched out area with thick callus and subcutaneous tissue around the wound. We have been using silver collagen. This is always been an issue with this man's wound especially on this foot 9/3; right second met head. Still the same punched-out area with thick callus and subcutaneous tissue around the wound removing the circumference demonstrates repetitively undermining area. I have removed all the subcutaneous tissue associated with this. We have been using silver collagen 9/15; right second metatarsal head. Same punched-out thick callus and subcutaneous tissue around the wound area. Still requiring debridement we have been using silver collagen I changed back to silver alginate X-ray last time showed no evidence of osteomyelitis. Culture showed Klebsiella and he was started and Keflex apparently just 4 days ago 9/24; right second metatarsal head. He is completed the antibiotics I gave him for 10 days. Same punched-out thick callus and subcutaneous tissue around the area. Still requiring debridement. We have been using silver alginate. The area itself looks swollen and this could be just [place] that comes on this area with walking on a prosthesis on the left however I think he needs an MRI and I am going to order that today. There is not an option to offload this further 10/1; right second plantar metatarsal head. MRI booked for October 6. Generally looking better today using silver alginate 10/8; right second plantar metatarsal head. MRI that was done on 10/6 was negative for osteomyelitis noted prior amputations of the second and fourth  toes at the MTP. Prior amputation of the third toe to the base of the middle phalanx. Prior amputation of the fifth toe to the head of the metatarsal. 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 He does not recall about hearing about this. He did not have any pain but then again he has reduced sensation from diabetic neuropathy 10/15; right second plantar metatarsal head. Still in the same condition callus nonviable subcutaneous tissue which cleans up nicely with debridement but reforms by the next week. The patient tells me that he is offloading this is much as he can including taking his wheelchair to dialysis. We have been using silver alginate, changed to silver collagen today 10/22; right second plantar metatarsal head. Wound measures smaller but still thick callus around this wound. Still requiring debridement 11/5 right second plantar metatarsal head. Less callus around the wound circumference but still requiring debridement. There is still some undermining which I cleaned out the wound looks healthy but still considerable punched out depth with a relatively small circumference to the wound there is no palpable bone no purulent drainage 11/12; right second plantar metatarsal head. Small wound with thick callus tissue around the wound and significant undermining. We have been using silver collagen after my continuous debridements of this area 11/19; patient arrives in today with purulent drainage coming out of the wound in the second third met head area on the right foot. Serosanguineous thick drainage. Specimen obtained for culture. 11/21/2019 on evaluation today patient appears to be doing well with   regard to his foot ulcer. The good news is is culture came back negative for any bacteria there was no growth. The other good news is his x-ray also appears to be doing well. The foot is also measuring much better than what it was. Overall I am very  pleased with how things seem to be progressing. 12/22; patient was in [facility] for several weeks due to the death of a family member. He said he stayed off the wound using silver alginate. 01/03/2020. Patient has a small open area with roughly 0.3 cm of circumferential undermining. The orifice looks smaller. We have been using silver collagen. There is not a wound more aggressive way to offload this area as he has a prosthesis on the other leg 1/21; again the same small area is 2 weeks ago. We have been using silver collagen I change that to endoform today I really believe that this is an offloading issue 2/4; the area on the right third met head. We have been using endoform. 2/11 the area on the right third met head we have been using silver alginate. 2/25; the area on the right third met head. There is much less callus on this today. The patient states he is offloading this even more aggressively. As an example he is taking his wheelchair into dialysis on most days 3/4; right third metatarsal head. Again he has eschar over the surface of the wound which I removed with a #3 curette. Some subcutaneous debris. This has 0.8 cm of direct probing depth. I cannot feel any bone here. I did do a culture the area 3/11; right third metatarsal head. Again an eschared area over the wound which almost makes this look superficial. Again debridement reveals a wound with probing depth probably not much different from last week there is no palpable bone no palpable purulence. The patient tells me that he is making every effort to offload this area properly. He is taking wheelchair into dialysis etc. There is no option for forefoot offloading or a total contact cast. We used Oasis #1 today 3/18; again callus and eschar over the wound surface. I remove this but still a probing wound although only 3 mm in depth this time. This is an improvement 3/25; again callus and eschar over the wound surface which I  removed the wound is much deeper today at 0.7 cm. There is no palpable bone. Because of the appearance of this a culture was done. I am not going to put the Oasis back in this. Concerned about underlying infection. Notable for the fact that he is just finished doxycycline for the last go round of this 3/30; still 0.7 cm in depth which is disappointing. Culture I gave last time showed a few Staph aureus which is methicillin susceptible. This should have been taken care of by the recent course of doxycycline I gave him. We are using silver alginate 4/6; almost 1 cm in depth. We have been using silver alginate with underlying Bactroban. 4/15; about 1 cm in depth still. There is no palpable bone but there is undermining thick surface again as usual. Our intake nurse noted what looked to be purulent drainage I suppose that could have been Bactroban but I did a culture for CandS 4/20; depth down 0.7 cm. Culture from last time was negative we have been using Bactroban and silver alginate 4/29; depth down 0.5 cm. Using silver alginate. He reports some drainage Electronic Signature(s) Signed: 04/24/2020 5:41:26 PM By: Linton Ham MD Entered  By: Linton Ham on 04/24/2020 14:26:42 -------------------------------------------------------------------------------- Chemical Cauterization Details Patient Name: Date of Service: JAYLYN, BOOHER 04/24/2020 1:00 PM Medical Record Number: [id number] Patient Account Number: [id number] Date of Birth/Sex: Treating RN: 1951-12-26 (69 y.o. [name] Primary Care Provider: [name] Other Clinician: Referring Provider: Treating Provider/Extender: Tanna Savoy in Treatment: [phone] Procedure Performed for: Wound #41 Right Metatarsal head second Performed By: Physician Ricard Dillon., MD Post Procedure Diagnosis Same as Pre-procedure Notes silver nitrate used. Electronic Signature(s) Signed: 04/24/2020 5:41:26 PM By: Linton Ham MD Entered By: Linton Ham on 04/24/2020 14:25:37 -------------------------------------------------------------------------------- Physical Exam Details Patient Name: Date of Service: ANDRON, MARRAZZO EL B. 04/24/2020 1:00 PM Medical Record Number: [id number] Patient Account Number: [id number] Date of Birth/Sex: Treating RN: 18-Apr-1951 (69 y.o. [name] Primary Care Provider: [name] Other Clinician: Referring Provider: Treating Provider/Extender: Tanna Savoy in Treatment: 60 Integumentary (Hair, Skin) No erythema around the wound no purulent drainage. Notes Wound exam; small punched-out hole over the right second metatarsal head. This has less depth it 0.5. Patient reports some drainage there is no surrounding erythema but callus around the wound Electronic Signature(s) Signed: 04/24/2020 5:41:26 PM By: Linton Ham MD Entered By: Linton Ham on 04/24/2020 14:28:44 -------------------------------------------------------------------------------- Physician Orders Details Patient Name: Date of Service: [name] 04/24/2020 1:00 PM Medical Record Number: [id number] Patient Account Number: [id number] Date of Birth/Sex: Treating RN: November 13, 1951 (69 y.o. 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 Dressing Change Frequency Wound #41 Right Metatarsal head second Do not change entire dressing for one week. Skin  Barriers/Peri-Wound Care Moisturizing lotion - right leg. Wound Cleansing May shower with protection. - patient to use a cast protector. Primary Wound Dressing Wound #41 Right Metatarsal head second Endoform - moisten with hydrogel. Secondary Dressing Wound #41 Right Metatarsal head second Dry Gauze Drawtex - over endoform Other: - felt to offload periwound. Edema Control Kerlix and Coban - Right Lower Extremity Avoid standing for long periods of time Elevate legs to the level of the heart or above for 30 minutes daily and/or when sitting, a frequency of: - throughout the day. Additional Orders / Instructions Other: - minimize walking and standing on foot to aid in wound healing and callus buildup. Electronic Signature(s) Signed: 04/24/2020 5:33:37 PM By: Deon Pilling Signed: 04/24/2020 5:41:26 PM By: Linton Ham MD Entered By: Deon Pilling on 04/24/2020 14:09:33 -------------------------------------------------------------------------------- Problem List Details Patient Name: Date of Service: RAUDEL, BAZEN EL B. 04/24/2020 1:00 PM Medical Record Number: [id number] Patient Account Number: [id number] Date of Birth/Sex: Treating RN: 1951/04/21 (69 y.o. 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, other extremity 06/12/2019 No Yes L03.115 Cellulitis of right lower limb 11/15/2019 No Yes Inactive  Problems ICD-10 Code Description Active Date Inactive Date L03.114 Cellulitis of left upper limb 06/12/2019 06/12/2019 L98.498 Non-pressure chronic  ulcer of skin of other sites with other specified severity 06/12/2019 06/12/2019 S61.209D Unspecified open wound of unspecified finger without damage to nail, subsequent 06/12/2019 06/12/2019 encounter Resolved Problems Electronic Signature(s) Signed: 04/24/2020 5:41:26 PM By: Linton Ham MD Entered By: Linton Ham on 04/24/2020 14:25:08 -------------------------------------------------------------------------------- Progress Note Details Patient Name: Date of Service: [name] 04/24/2020 1:00 PM Medical Record Number: [id number] Patient Account Number: [id number] Date of Birth/Sex: Treating RN: 1951/01/29 (69 y.o. [name] Primary Care Provider: [name] Other Clinician: Referring Provider: Treating Provider/Extender: Tanna Savoy in Treatment: 45 Subjective History of Present Illness (HPI) The following HPI elements were documented for the patient's wound: Location: Patient presents with a wound to bilateral feet. Quality: Patient reports experiencing essentially no pain. Severity: Mildly severe wound with no evidence of infection Duration: Patient has had the wound for greater than 2 weeks prior to presenting for treatment The patient is a pleasant 69 yrs old bm here for evaluation of ulcers on the plantar aspect of both feet. 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 He has a left arm graft for access. He has been trying to stay off his feet for weeks but does not seem to have any improvement in the wound. He has been seeing someone at the foot center and was referred to the Wound Care center for further evaluation. 12/30/15 the patient has 3 wounds one over the right first metatarsal head and 2 on the left foot at the left fifth and lleft first metatarsal head. All of these look relatively similar. The one over the left fifth probe to bone I could not prove that any of the others  did. I note his MRI in November that did not show osteomyelitis. His peripheral pulses seem robust. All of these underwent surgical debridement to remove callus nonviable skin and subcutaneous tissue 01/06/16; the patient had his sutures removed from the right fifth ray amputation. There may be a small open part of this superiorly but otherwise the incision looks good. Areas over his right first, left first and left fifth metatarsal head all underwent surgical debridement as a varying degree of callus, skin and nonviable subcutaneous tissue. The area that is most worrisome is the right fifth metatarsal head which has a wound probes precariously close to bone. There is no purulent drainage or erythema 01/13/16; I'm not exactly sure of the status of the right fifth ray amputation site however he follows with Dr. [name] later this week. The area over his right first plantar metatarsal head, left first and fifth plantar metatarsal head are all in the same status. Thick circumferential callus, nonviable subcutaneous tissue. Culture of the left fifth did not culture last week 01/20/16; all of the patient's wounds appear and roughly the same state although his amputation site on the right lateral foot looks better. His wounds over the right first, left first and left fifth metatarsal heads all underwent difficult surgical debridement removing circumferential callus nonviable skin and subcutaneous tissue. There is no overt evidence of infection in these areas. MRI at the end of December of the left foot did not show osteomyelitis, right foot showed osteomyelitis of the right fifth digit he is now status post amputation. 01/27/16 the patient's wounds over his plantar first and fifth metatarsal heads on the left all appear better  having been started on a total contact cast last week. They were debridement of circumferential callus and nonviable subcutaneous tissue as was the wound over the first metatarsal head. His  surgical incision on the right also had some light surface debridement done. 03/23/2016 -- the patient was doing really well and most of his wounds had almost completely healed but now he came back today with a history of having a discharge from the area of his right foot on the plantar aspect and also between his first and second toe. He also has had some discharge from the left foot. Addendum: I spoke to the PA [name] at the dialysis center whose fax number is [phone]. We discussed the infection the patient has and she will put the patient on vancomycin and Fortaz until the final culture report is back. We will fax this as soon as available. 03/30/2016 -- left foot x-ray IMPRESSION:No definitive osteomyelitis noted. X-ray of the right foot -- IMPRESSION: 1. Soft tissue swelling. Prior amputation right fifth digit. No acute or focal bony abnormality identified. If osteomyelitis remains a clinical concern, MRI can be obtained. 2. Peripheral vascular disease. His culture reports have grown an MSSA -- and we will fax this report to his hemodialysis center. He was called in a prescription of oral doxycycline but I have told her not to fill this in as he is already on IV antibiotics. 04/06/2016 -- a few days ago, I spoke to the hemodialysis center nurse who had stopped the IV antibiotics and he was given a prescription for doxycycline 100 mg by mouth twice a day for a week and he is on this at the present time. 05/11/2016 -- he has recently seen his PCP this week and his hemoglobin A1c was 7. He is working on his paperwork to get his orthotic shoes. 05/25/2016 -- -- x-ray of the left foot IMPRESSION:No acute bony abnormality. No radiographic changes of acute osteomyelitis. No change since prior study. X-ray of the right foot -- IMPRESSION: Postsurgical changes are seen involving the fifth toe. No evidence of acute osteomyelitis. he developed a large blister on the medial part of his  right foot and this opened out and drain fluid. 06/01/2016 -- he has his MRI to be done this afternoon. 06/15/2016 -- MRI of the left forefoot without contrast shows 2 separate regions of cutaneous and subcutaneous edema and possible ulceration and blistering along the ball of the foot. No obvious osteomyelitis identified. MRI of the right foot showed cutaneous and subcutaneous thickening plantar to the first digit sesamoid with an ulcer crater but no underlying osteomyelitis is identified. 06/22/2016 -- the right foot plantar ulcer has been draining a lot of seropurulent material for the last few days. 06/30/2016 -- spoke to the dialysis center and I believe I spoke to Louin a PA at the center who discussed with me and agreed to putting Legrand Como on vancomycin until his cultures arrive. On review of his culture report no WBCs were seen or no organisms were seen and the culture was reincubated for better growth. The final report is back and there were no predominant growth including Streptococcus or Staphylococcus. Clinically though he has a lot of drainage from both wounds a lot of undermining and there is further blebs on the left foot towards the interspace between his first and second toe. 08/10/2016 -- the culture from the right foot showed normal skin flora and there was no Staphylococcus aureus was group A streptococcus isolated. 09/21/2016 -- --  MRI of the right foot was done on 09/13/2016 - IMPRESSION: Findings most consistent with acute osteomyelitis throughout the great toe, sesamoid bones and plantar aspect of the head of the first metatarsal. Fluid in the sheath of the flexor tendon of the great toe could be sympathetic but is worrisome for septic tenosynovitis. First MTP joint effusion worrisome for septic joint. He was admitted to the hospital on 09/11/2016 and treated for a fever with vancomycin and cefepime. He was seen by Dr. Bobby Rumpf of infectious disease who recommended 6  weeks treatment with vancomycin and ceftazidime with his hemodialysis and to continue to see as in the wound clinic. Vascular consult was pending. The patient was discharged home on 09/14/2016 and he would continue with IV antibiotics for 6 weeks. 09/28/16 wound appears reasonably healthy. Continuing with total contact cast 10/05/16 wound is smaller and looking healthy. Continue with total contact cast. He continues on IV antibiotics 10/12/2016 -- he has developed a new wound on the dorsal aspect of his left big toe and this is a superficial injury with no surrounding cellulitis. He has completed 30 days of IV antibiotics and is now ready to start his hyperbaric oxygen therapy as per his insurance company's recommendation. 10/19/2016 -- after the cast was removed on the right side he has got good resolution of his ulceration on the right plantar foot. he has a new wound on the left plantar foot in the region of his fourth metatarsal and this will need sharp debridement. 10/26/2016 -- Xray of the right foot complete: IMPRESSION: Changes consistent with osteomyelitis involving the head of the first metatarsal and base of the first proximal phalanx. The sesamoid bones are also likely involved given their positioning. 11/02/2016 -- there still awaiting insurance clearance for his hyperbaric oxygen therapy and hopefully he will begin treatment soon. 11/09/2016 -- started with hyperbaric oxygen therapy and had some barotrauma to the right ear and this was seen by ENT who was prescribed Afrin drops and would probably continue with HBO and he is scheduled for myringotomy tubes on Friday 11/16/2016 -- he had his myringotomy tubes placed on Friday and has been doing much better after that with some fluid draining out after hyperbaric treatment today. The pain was much better. 11/30/2016 -- over the last 2 days he noticed a swelling and change of color of his right second toe and this had been draining minimal  fluid. 12/07/2016 -- x-ray of the right foot -- IMPRESSION:1. Progressive ulceration at the distal aspect of the second digit with significant soft tissue swelling and osseous changes in the distal phalanx compatible with osteomyelitis. 2. Chronic osteomyelitis at the first MTP joint. 3. Ulcerations at the second and third toes as well without definite osseous changes. Osteomyelitis is not excluded. 4. Fifth digit amputation. On 12/01/2016 oo I spoke to Dr. Bobby Rumpf, the infectious disease specialist, who kindly agreed to treat this with IV antibiotics and he would call in the order to the dialysis center and this has been discussed in detail with the patient who will make the appropriate arrangements. The patient will also book an appointment as soon as possible to see Dr. [name] in the office. Of note the patient has not been on antibiotics this entire week as the dialysis center did not receive any orders from Dr. [name]. I got in touch with Dr. [name] who tells me the patient has an appointment to see him this coming Wednesday. 12/14/2016 -- the patient is on ceftazidime and vancomycin  during his dialysis, and I understand this was put on by his nephrologist Dr. Raliegh Ip possibly after speaking with infectious disease Dr. [name] 12/23/16; patient was on my schedule today for a wound evaluation as he had difficulties with his schedule earlier this week. I note that he is on vancomycin and ceftazidine at dialysis. In spite of this he arrives today with a new wound on the base of the right second toe this easily probes to bone. The known wound at the tip of the second toe With this area. He also has a superficial area on the medial aspect of the third toe on the side of the DIP. This does not appear to have much depth. The area on the plantar left foot is a deep area but did not probe the bone 12/28/16 -- he has brought in some lab work and the most recent labs done showed a hemoglobin of 12.1  hematocrit of 36.3, neutrophils of 51% WBC count of 5.6, BN of 53, albumin of 4.1 globulin of 3.7, vancomycin 13 g per mL. 01/04/2017 -- he saw Dr. [name] who has recommended a amputation of the right second toe and he is awaiting this date. He sees Dr. Bobby Rumpf of infectious disease tomorrow. The bone culture taken on 12/28/2016 had no growth in 2 days. 01/11/2017 -- was seen by Dr. Bobby Rumpf regarding the management and has recommended a eval by vascular surgeons. He recommended to continue the antibiotics during his hemodialysis. Xray of the left foot -- IMPRESSION: No acute fracture, dislocation, or osseous erosion identified. 01/18/2017 -- he has his vascular workup later today and he is going to have his right foot second toe amputation this coming Friday by Dr. [name]. We have also put in for a another 30 treatments with hyperbaric oxygen therapy 01/25/2017 -- I have reviewed Dr. Donnetta Hutching is vascular report from last week where he reviewed him and thought his vascular function was good enough to heal his amputation site and no further tests were recommended. He also had his orthopedic related to surgery which is still pending the notes and we will review these next week. 02/01/2017 -- the operative remote of Dr. 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 02/08/2017 --he was seen by Dr. [name] on 02/03/2017 -- patient is on [place]. after a thorough review he had recommended to continue with antibiotics during hemodialysis. The patient was seen by Dr. [name], but I do not find any follow-up note on the electronic  medical record. he has removed the dressing over the right foot to remove the sutures and asked him to see him back in a month's time 02/22/2017 -- patient has been febrile and has been having symptoms of the upper respiratory tract infection but has not been checked for the flu and it's been over 4 days now. He says he is feeling better today. Some drainage between his left first and second toe and this needed to be looked at 03/08/2017 -- was seen by Dr. Bobby Rumpf on 03/07/2017 after review he will stop his antibiotics and see him back in a month to see if he is feeling well. 03/15/2017 -- he has completed his  course of hyperbaric oxygen therapy and is doing fine with his health otherwise. 03/22/2017 -- his nutritionist at the dialysis center has recommended a protein supplement to help build his collagen and we will prescribe this for him when he has the details 05/03/2017 -- he recently noticed the area on the plantar aspect of his fifth metatarsal head which opened out and had minimal drainage. 05/10/2017 -- -- x-ray of the left foot -- IMPRESSION:1. No convincing conventional radiographic evidence of active osteomyelitis.2. Active soft tissue ulceration at the tip of the fifth digit, and at the lateral aspect of the foot adjacent the base of the fifth metatarsal. 3. Surgical changes of prior fourth toe amputation. 4. 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 5. Small vessel atherosclerotic vascular calcifications. 6. 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 ======= Readmission after 5 weeks: 06/15/2017 -- the patient returns after 5 weeks having had an MRI done on 05/17/2017 MRI of the left foot without contrast showed soft tissue ulcer overlying the base of the fifth metatarsal. 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 No drainable fluid collection to suggest an  abscess. He was in hospital between 05/16/2017 and 05/25/2017 -- and on discharge was asked to follow-up with Dr. [name] and Dr. [name]. He was treated 2 weeks post discharge with vancomycin plus ceftriaxone with hemodialysis and oral metronidazole 500 mg 3 times a day. 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 last hemoglobin A1c was 6.7% He had a postoperative MR of the foot on 05/23/2017 -- which showed no new areas of cortical bone loss, edema or soft tissue ulceration to suggest osteomyelitis. the patient has completed his course of IV antibiotics during dialysis and is to see the infectious disease doctor tomorrow. Dr. [name] had seen him after suture removal and asked him to keep the wound with a dry dressing 06/21/2017 -- he was seen by Dr. Bobby Rumpf of infectious disease on 06/16/2017 -- he stopped his Flagyl and will see him back in 6 weeks. He was asked to follow-up with Dr. [name] and with the wound clinic clinic. 07/05/17; bone biopsy from last time showed acute osteomyelitis. This is from the reminiscent in the left fifth metatarsal. Culture result is apparently still pending [holding for anaerobe}. From my understanding in this case this is been a progressive necrotic wound which is deteriorated markedly over the last 3 weeks since he returned here. He now has a large area of exposed bone which was biopsied and cultured last week. Dr. Graylon Good has put him on vancomycin and Fortaz during his hemodialysis and Flagyl orally. He is to see Dr. [name] next week 07/19/2017 -- the patient was reviewed by Dr. [name] of orthopedics who reviewed the case in detail and agreed with the plan to continue with IV antibiotics, aggressive wound care and hyperbaric oxygen therapy. He would see him back in 3 weeks' time 08/09/2017 -- saw Dr. Bobby Rumpf on 08/03/2017 -he was restarted on his antibiotics for 6 weeks  which included vanco, ceftaz and Flagyl. he recommended continuing his antibiotics for 6 weeks and reevaluate his completion date. He would continue with wound care and hyperbaric oxygen therapy. 08/16/2017 -- I understand he will be completing his 6 weeks of antibiotics sometime later this week. 09/19/2017 -- he has been without his wound VAC for the last  week due to lack of supplies. He has been packing his wound with silver alginate. 10/04/2017 -- he has an appointment with Dr. [name] tomorrow and hence we will not apply the wound VAC after his dressing changes today. 10/11/2017 -- he was seen by Dr. Bobby Rumpf on 10/05/2017, noted that the patient was currently off antibiotics, and after thorough review he recommended he follow-up with the wound care as per plan and no further antibiotics at this point. 11/29/17; patient is arrived for the wound on his left lateral foot.Marland Kitchen He is completed IV antibiotics and 2 rounds of hyperbaric oxygen for treatment of underlying osteomyelitis. He arrives today with a surface on most of the wound area however our intake nurse noted drainage from the superior aspect. This was brought to my attention. 12/13/2017 -- the right plantar foot had a large bleb and once the bleb was opened out a large callus and subcutaneous debris was removed and he has a plantar ulcer near the fourth metatarsal head 12/28/17 on evaluation today patient appears to be doing acutely worse in regard to his left foot. The wound which has been appearing to do better as now open up more deeply there is bone palpable at the base of the wound unfortunately. He tells me that this feels "like it did when he had osteomyelitis previously" he also noted that his second toe on the left foot appears to be doing worse and is swollen there does appear to be some fluid collected underneath. His right foot plantar ulcer appears to be doing somewhat better at this point and there really is no complication  at the site currently. No fevers, chills, nausea, or vomiting noted at this time. Patient states that he normally has no pain at this site however. T oday he is having significant pain. 01/03/18; culture done last week showed methicillin sensitive staph aureus and group B strep. He was on [place] however he arrives today with a fever of 101. He has a functional dialysis shunt in his left arm but has had no pain here. No cough. He still makes some urine no dysuria he did have abdominal pain nausea and diarrhea over the weekend but is not had any diarrhea since yesterday. Otherwise he has no specific complaints 01/05/18; the patient returns today in follow-up for his presentation from 1/8. At that point he was febrile. I gave him some Levaquin adjusted for his dialysis status. He tells me the fever broke that night and it is not likely that this was actually a wound infection. He is gone on to have an MRI of the left foot; this showed interval development of an abnormal signal from the base of the third and fourth metatarsal and in the cuboid reminiscent consistent with osteomyelitis. There is no mention of the left second toe I think which was a concern when it was ordered. The patient is taking his Levaquin 01/10/18; the patient has completed his antibiotics today. The area on the left lateral foot is smaller but it still probes easily to bone. He has underlying osteomyelitis here and sees Dr. Megan Salon of infectious disease this week ooThe area on the right third plantar metatarsal head still requires debridement not much change in dimensions [facility] has a new wound on the medial tip of his right third toe again this probes to bone. He only noticed this 2 days ago 01/17/18; the patient saw Dr. [name] last week and he is back on Vanco and Fortaz at dialysis. This is related  to the osteomyelitis in the base of his left lateral foot which I think is the bases of his third and fourth metatarsals and cuboid  remnant. We are previously seeing him for a wound also on the base of the fourth med head on the right. X-ray of this area did not show osteomyelitis in relation to a new wound on the tip of his right third toe. Again this probes to bone. Finally his second toe on the left which didn't really show anything on the MRI at least no report appears to have a separated cutaneous area which I think is going to come right off the tip of his toe and leave exposed bone. Whether this is infectious or ischemic I am not clear Culture I did from the third toe last week which was his new wound was negative. Plain x-ray on the right foot did not show osteomyelitis in the toes 01/24/18; the patient is going went to see Dr. [name]. He is going to have a amputation of the left second and right third toe although paradoxically the left second toe looks better than last week. We have treating a deep probing wound on the left lateral foot and the area over the third metatarsal head on the right foot. 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 Also a debridement including bone of the left lateral foot and a closure which is still sutured. This is a bit surprising. Also apparent debridement of the base of the right third toe wound. 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 I'm not sure exactly where we are in the frame of things here. The wound on the left lateral foot is still sutured also the amputation sites of the left second and right third toe. He is been using Xeroform to the sutured areas silver alginate to the right foot 02/14/18; he continues on Rocephin at dialysis for osteomyelitis. I still don't have a good sense of where we are in the treatment duration. The wounds on the left lateral  foot had the sutures removed and this clearly still probes deeply. We debrided the area with a #5 curet. We'll use silver alginate to the wound oothe area over the right third met head also required debridement of callus skin and subcutaneous tissue and necrotic debris over the wound surface. This tunnels superiorly but I did not unroofed this today. ooThe areas on the tip of his toes have a crusted surface eschar I did not debridement this either. 02/21/18; he continues on Rocephin at dialysis for osteomyelitis. I don't have a good sense of where we are and the treatment duration. The wounds on the left lateral foot is callused over on presentation but requires debridement. The area on the right third med head plantar aspect actually is measuring smaller 02/28/18;patient is on Vanco and Fortaz at dialysis, I'm not sure why I thought this was Rocephin above unless it is been changed. I don't have a good sense of time frame here. Using silver collagen to the area on the left lateral foot and right third med head plantar foot 03/07/18; patient is completing bank and Fortaz at dialysis soon. He is using Silver collagen to the area on the left lateral foot and the right third metatarsal head. He  has a smaller superficial wound distal to the left lateral foot wound. 03/14/18-he is here in follow-up evaluation for multiple ulcerations to his bilateral feet. He presents with a new ulceration to the plantar aspect of the left foot underneath the blister, this was deroofed to reveal a partial thickness ulcer. He is voicing no complaints or concerns, tolerated dialysis yesterday. We will continue with same treatment plan and he will follow-up next week 03/21/18; the patient has a area on the lateral left foot which still has a small probing area. The overall surface area of the wound is better. He presented with a new ulceration on the left foot plantar fifth metatarsal head last week. The area on the right third  metatarsal head appears smaller.using silver alginate to all wound areas. The patient is changing the dressing himself. He is using a [place] forefoot offloading are on the right and a healing sandal on the left 03/28/18; original wound left lateral foot. Still nowhere close to looking like a heeling surface oosecondary wound on the left lateral foot at the base of his question fifth metatarsal which was new last week ooRight third metatarsal head. ooWe have been using silver alginate all wounds 04/04/18; the original wound on the left lateral foot again heavy callus surrounding thick nonviable subcutaneous tissue all requiring debridement. The new wound from 2 weeks ago just near this at the base of the fifth metatarsal head still looks about the same ooUnfortunately there is been deterioration in the third medical head wound on the right which now probes to bone. I must say this was a superficial wound at one point in time and I really don't have a good frame of reference year. I'll have to go back to look through records about what we know about the right foot. 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 He is also been to see Dr. [name]. Somewhere in here as somebody is ordered a VASCULAR evaluation which was done on 03/22/18. On the right is anterior tibial artery was monophasic triphasic at the posterior tibial artery. On the left anterior tibial artery monophasic posterior tibial artery monophasic. ABI in the right was 1.43 on the left 1.21. TBIs on the right at 0.41 and on the left at 0.32. A vascular consult was recommended and I think has been arranged. 04/11/18; Mr. Lichty has not had an MRI of the right foot since 2017. Recent x-ray of the right foot done in January and February was negative. However he has had a major deterioration in the wound over the third met head. He is completed antibiotics  last week at dialysis [place]. I think he is going to see vascular in follow-up. The areas on the left lateral foot and left plantar fifth metatarsal head both look satisfactory. I debrided both of these areas although the tissue here looks good. The area on the left lateral foot once probe to bone it certainly does not do that now 04/18/18; MRI of the right foot is on Thursday. He has what looks to be serosanguineous purulent drainage coming out of the wound over the right third metatarsal head today. He completed antibiotics bank and Fortaz 2 weeks ago at dialysis. He has a vascular follow-up with regards to his arterial insufficiency although I don't exactly see when that is booked. The areas on the left foot including the lateral left foot and the plantar left fifth  metatarsal head look about the same. 04/25/18-He is here in follow-up evaluation for bilateral foot ulcers. MRI obtained was negative for osteomyelitis. Wound culture was negative. We will continue with same treatment plan he'll follow-up next week 05/02/18; the right plantar foot wound over the third metatarsal head actually looks better than when I last saw this. His MRI was negative for osteomyelitis wound culture was negative. ooOn the left plantar foot both wounds on the plantar fifth metatarsal head and on the lateral foot both are covered in a very hard circumferential callus. 05/09/18; right plantar foot wound over the third metatarsal head stable from last week. ooLeft plantar foot wound over the fifth metatarsal head also stable but with callus around both wound areas ooThe area on the left lateral foot had thick callus over a surface and I had some thoughts about leaving this intact however it felt boggy. ooWe've been using silver alginate all wounds 05/16/18; since the patient was last here he was hospitalized from 5/15 through 5/19. 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 This grew MRSA. He was placed on vancomycin.MRI of the left foot suggested "progressive" osteomyelitis and bone destruction of the cuboid and the base of the fifth metatarsal. Stable erosive changes of the base of the second metatarsal. I'm wondering if they're aware that he had surgery and debridement in the area of the underlying bone previously by Dr. [name]. In any case, He was also revascularized with an angioplasty and stenting of the left tibial peroneal trunk and angioplasty of the left posterior tibial artery. He is on vancomycin at dialysis. He has a 2 week follow-up with infectious disease. He has vascular surgery follow-up. He was started on Plavix. 05/23/18; the patient's wound on the lateral left foot at the level of the fifth metatarsal head and the plantar wound on the plantar fifth metatarsal head both look better. The area on the right third metatarsal head still has depth and undermining. We've been using silver alginate. He is on vancomycin. He has been revascularized on the left 05/30/18; he continues on vancomycin at dialysis. Revascularized on the left. The area on the left lateral foot is just about closed. Unfortunately the area over the plantar fifth metatarsal head undermining superiorly as does the area over the right third metatarsal head. Both of these significantly deteriorated from last week. He has appointments next week with infectious disease and orthopedics I delayed putting on a cast on the left until those appointments which therefore we made we'll bring him in on Friday the 14th with the idea of a cast on the left foot 06/06/18; he continues on vancomycin at dialysis. Revascularized on the left. He arrives today with a ballotable swelling just above the area on the left lateral foot where his previous wound was. He has no pain but he is reasonably insensate. The difficult areas on the plantar left  fifth metatarsal head looks stable whereas the area on the right third plantar metatarsal head about the same as last week there is undermining here although I did not unroofed this today. He required a considerable debridement of the swelling on the left lateral foot area and I unroofed an abscess with a copious amount of brown purulent material which I obtained for culture. I don't believe during his recent hospitalization he had any further imaging although I need to review this. Previous cultures from  this area done in this clinic showed MRSA, I would be surprised if this is not what this is currently even though he is on vancomycin. 06/13/18; he continues on vancomycin at dialysis however he finishes this on Sunday and then graduates the doxycycline previously prescribed by Dr. [name]. The abscess site on the lateral foot that I unroofed last time grew moderate amounts of methicillin-resistant staph aureus that is both vancomycin and tetracycline sensitive so we should be okay from that regard. He arrives today in clinic with a connection between the abscess site and the area on the lateral foot. We've been using silver alginate to all his wound areas. The right third metatarsal head wound is measuring smaller. Using silver alginate on both wound areas 06/20/18; transitioning to doxycycline prescribed by Dr. [name]. The abscess site on the lateral foot that I unroofed 2 weeks ago grew MRSA. That area has largely closed down although the lateral part of his wound on the fifth metatarsal head still probes to bone. I suspect all of this was connected. ooOn the right third metatarsal head smaller looking wound but surrounded by nonviable tissue that once again requires debridement using silver alginate on both areas 06/27/18; on doxycycline 100 twice a day. The abscess on the lateral foot has closed down. Although he still has the wound on the left fifth metatarsal head that extends towards the  lateral part of the foot. The most lateral part of this wound probes to bone ooRight third metatarsal head still a deep probing wound with undermining. Nevertheless I elected not to debridement this this week ooIf everything is copious static next week on the left I'm going to attempt a total contact cast 07/04/18; he is tolerating his doxycycline. The abscess on the left lateral foot is closed down although there is still a deep wound here that probes to bone. We will use silver collagen under the total contact cast Silver collagen to the deep wound over the right third metatarsal head is well 07/11/18; he is tolerating doxycycline. The left lateral fifth plantar metatarsal head still probes deeply but I could not probe any bone. We've been using silver collagen and this will be the second week under the total contact cast ooSilver collagen to deep wound over the right third metatarsal head as well 07/18/18; he is still taking doxycycline. The left lateral fifth plantar metatarsal this week probes deeply with a large amount of exposed bone. Quite a deterioration. He required extensive debridement. Specimens of bone for pathology and CNS obtained ooAlso considerable debridement on the right third metatarsal head 07/25/18; bone for pathology last week from the lateral left foot showed osteomyelitis. CandS of the bone Enterobacter. And Klebsiella. He is now on ciprofloxacin and the doxycycline is stopped [Dr. [name] of infectious disease] area He has an appointment with Dr. [name] tomorrow I'm going to leave the total contact cast off for this week. May wish to try to reapply that next week or the week after depending on the wound bed looks. I'm not sure if there is an operative option here, previously is followed with Dr. [name] 08/01/18 on evaluation today patient presents for reevaluation. He has been seen by infectious disease, Dr. [name], and he has placed the patient back on Ceftaz currently to  be given to him at dialysis three times a week. 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 Fortunately there does not appear to be the  evidence of infection at this point in time. Overall the patient has been tolerating the dressing changes without complication. We are no longer due lives in the cast of left foot secondary to the infection obviously. 08/10/18 on evaluation today patient appears to be doing rather well all things considered in regard to his left plantar foot ulcer. He does have a significant callous on the lateral portion of his left foot he wonders if I can help clean that away to some degree today. Fortunately he is having no evidence of infection at this time which is good news. No fevers chills noted. With that being said his right plantar foot actually does have a significant callous buildup around the wound opening this seems to not be doing as well as I would like at this point. I do believe that he would benefit from sharp debridement at this site. Subsequently I think a total contact cast would be helpful for the right foot as well. 08/17/18; the patient arrived today with a total contact cast on the right foot. This actually looks quite good. He had gone to see Dr. Megan Salon of infectious disease about the osteomyelitis on the left foot. I think he is on IV Fortaz at dialysis although I am not exactly sure of the rationale for the Fortaz at the time of this dictation. He also arrived in today with a swelling on the lateral left foot this is the site of his original wounds in fact when I first saw this man when he was under Dr. Ardeen Garland care I think this was the site of where the wound was located. It was not particularly tender however using a small scalpel I opened it to [place] some moderate amount of purulent drainage. We've been using silver alginate to all wound areas and a total contact cast on the right  foot 08/24/18; patient continues on Fortaz at dialysis for osteomyelitis left foot. Last MRI was in May that showed progressive osteomyelitis and bone destruction of the cuboid and base of the fifth metatarsal. Last week he had an abscess over this same area this was removed. Culture was negative. ooWe are continuing with a total contact cast to the area on the third metatarsal head on the right and making some good progress here. The patient asked me again about renal transplant. He is not on the list because of the open wounds. 08/31/18; apparently the patient had an interruption in the [place] but he is now back on this at dialysis. Apparently there was a misunderstanding and Dr. Crissie Figures orders. Due to have the MRI of the left foot tonight. ooThe area on the right foot continues to have callus thick skin and subcutaneous tissue around the wound edge that requires constant debridement however the wound is smaller we are using a total contact cast in this area. Alginate all wounds 09/07/18; without much surprise the MRI of his left foot showed osteomyelitis in the reminiscent of his fourth metatarsal but also the third metatarsal. She arrives in clinic today with the right foot and a total contact cast. There was purulent drainage coming out of the wound which I have cultured. Marked deterioration here with undermining widely around the wound orifice. Using pickups and a scalpel I remove callus and subcutaneous tissue from a substantial new opening. ooIn a similar fashion the area on the left lateral foot that was blistered last week I have opened this and remove skin and subcutaneous tissue from this area to expose a obvious new  wound. I think there is extension and communication between all of this on the left foot. ooThe patient is on Fortaz at dialysis. Culture done of the right foot. We are clearly not making progress here. ooWe have made him an appointment with Dr. [name] of orthopedics. I  think an amputation of the left leg may be discussed. I don't think there is anything that can be done with foot salvage. 09/14/18; culture from the right foot last time showed a very resistant MRSA. This is resistant to doxycycline. I'm going to try to get linezolid at least for a week. He does not have a appointment with Dr. [name] yet. 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 I'll send a message to Dr. [name] of infectious disease. I want the patient to see Dr. [name] to go over the pros and cons of an amputation which I think will be a BKA. The patient had a question about whether this is curative or not. I told him that I thought it would be although spread of staph aureus infection is not unheard of. MRI of the right foot was done at the end of April 2019 did not show osteomyelitis. The patient last saw Dr. [name] on 9/3. I'll send Dr. [name] a message. I would really like him to weigh the pros and cons of a BKA on the left otherwise he'll probably need IV antibiotics and perhaps hyperbaric oxygen again. He has not had a good response to this in the past. His MRI earlier this month showed progressive damage in the remnants of the fourth and third metatarsal 09/21/2018; sees Dr. [name] of orthopedics next week to discuss a left BKA in response to the osteomyelitis in the left foot. Using silver alginate to all wound areas. He is completing the Zyvox I did put in for him after last culture showed MRSA 09/29/2018; patient saw Dr. [name] of orthopedics discussed the osteomyelitis in the left foot third and fourth metatarsals. He did not recommend urgent surgery but certainly stated the only surgical option would be a BKA. He is communicating with Dr. [name]. The problem here is the instability of the areas on his foot which constantly generate draining abscesses. I will communicate with Dr. [name] about this. He has an appointment on  10/23 10/05/2018; patient sees Dr. [name] on 10/23. I have sent him a secure message not ordered any additional antibiotics for now. Patient continues to have 2 open areas on the left plantar foot at the fifth metatarsal head and the lateral aspect of the foot. These have not changed all that much. The area on the right third met head still has thick callus and surrounding subcutaneous tissue we have been using silver alginate 10/12/2018; patient sees Dr. [name] or colleague on 10/23. 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 The area on the right plantar foot is just about closed. The superior wound on the left fifth metatarsal head is callused over but I am not sure if this is closed. The area below it is about the same. Considerable amount of callus on the lateral foot. We have been using silver alginate to the wounds 10/19/2018; the patient saw Dr. [name] on 10/22. He wishes to try and continue to save the left foot. He has been given 6 weeks of oral ciprofloxacin. We continue to put a total contact cast on the right  foot. The area over the fifth metatarsal head on the left is callused over/perhaps healed but I did not remove the callus to find out. He still has the wound on the left lateral midfoot requiring debridement. We have been using silver alginate to all wound areas 10/26/2018 ooOn the left foot our intake nurse noted some purulent drainage from the inferior wound [currently the only one that is not callused over] ooOn the right foot even though he is in total contact cast a considerable amount of thick black callus and surface eschar. On this side we have been using silver alginate under a total contact cast. oohe remains on ciprofloxacin as prescribed by Dr. [name] 11/02/2018; culture from last week which is done of the probing area on the left midfoot wound showed MRSA "few". I am going to need to contact Dr.  [name] which I will do today I think he is going to need IV antibiotics again. The area on the left foot which was callused on the side is clearly separating today all of this was removed a copious amounts of callus and necrotic subcutaneous tissue. ooThe area on the right plantar foot actually remained healthy looking with a healthy granulated base. ooWe are using silver alginate to all wound areas 11/09/2018; unfortunately neither 1 of the patient's wounds areas looks at all satisfactory. On the left he has considerable necrotic debris from the plantar wound laterally over the foot. I removed copious amounts of material including callus skin and subcutaneous tissue. On the right foot he arrives with undermining and frankly purulent drainage. Specimen obtained for culture and debridement of the callused skin and subcutaneous tissue from around the circumference. Because of this I cannot put him back in a total contact cast but to be truthful we are really unfortunately not making a lot of progress I did put in a secure text message to Dr. [name] wondering about the MRSA on the left foot. He suggested vancomycin although this is not started. I was left wondering if he expected me to send this into dialysis. Has we have more purulence on the right side wait for that result before calling dialysis 11/16/2018; I called dialysis earlier this week to get the vancomycin started. I think he got the first dose on Tuesday. The patient tells me that he fell earlier this week twisting his left foot and ankle and he is swelling. He continues to not look well. He had blood cultures done at dialysis on Tuesday he is not been informed of the results. 12/07/2018; the patient was admitted to hospital from 11/22/2018 through 12/02/2018. He underwent a left BKA. Apparently had staph sepsis. In the meantime being off the right foot this is closed over. He follows up with Dr. [name] this  afternoon Readmission: 01/10/19 upon evaluation today patient appears for follow-up in our clinic status post having had a left BKA on 11/20/18. Subsequent to this he actually had multiple falls in fact he tells me to following which calls the wound to be his apparently. He has been seeing Dr. [name] and his physician assistant in the interim. They actually did want him to come back for reevaluation to see if there's anything we can do for me wound care perspective the help this area to heal more appropriately. The patient states that he does not have a tremendous amount of pain which is good news. No fevers, chills, nausea, or vomiting noted at this time. We have gotten approval from Dr. [name] office had [facility]  orthopedics for [place] to treat the patient for his stop wound. This is due to the fact that the patient was in the 90 day postop global. 1/21; the patient does not have an open wound on the right foot although he does have some pressure areas that will need to be padded when he is transferring. The dehisced surgical wound looks clean although there is some undermining of 1.5 cm superiorly. 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 This appears healthy but there is undermining. TheraSkin #1 2/11; TheraSkin #2 2/25; TheraSkin #3. Wound is measuring smaller 3/10; TheraSkin #4 wound is measuring smaller 3/24; TheraSkin #5 wound is measuring smaller and looks healthy. 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 He has no open area on the right foot although he does have some callused area over the original third metatarsal head wound. The third metatarsal is also subluxed on the right foot. I have warned him today that he cannot consider wearing a prosthesis for at least a month but he can go for measurements. He is going to need to keep the right foot padded is much as possible in his diabetic  shoe indefinitely READMISSION 06/12/2019 Mr. Nuon is a type II diabetic on dialysis. He has been in this clinic multiple times with wounds on his bilateral lower extremities. 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 He also has a history of wounds on his right foot but tells me that everything is going well here. He actually came in here with his prosthesis walking with a cane. We were all really quite gratified to see this He states over the last several weeks he has had a painful area at the tip of the left third finger. His dialysis shunt in his is in the left upper arm and this particularly hurts during dialysis when his fingers get numb and there is a lot of pain in the tip of his left third finger. I believe he is seen vascular surgery. He had a Doppler done to evaluate for a dialysis steal syndrome. He is going for a banding procedure 1 week from today towards the left AV fistula. I think if that is unsuccessful they will be looking at creating a new shunt. He had a course of doxycycline when he which he finished about a week ago. He has been using Bactroban to the finger 6/25; the patient had his banding procedure and things seem to be going better. He is not having pain in his hand at dialysis. The area on the tip of his finger seems about closed. He did however have a paronychia I the last time I saw him. I have been advising him to use topical Bactroban washing the finger. Culture I did last time showed a few staph epidermidis which I was willing to dismiss as superficial skin contaminant. He arrives today with the finger wound looking better however the paronychia a seems worse 7/9; 2-week follow-up. He does not have an open wound on the tip of his third left finger. I thought he had an initial ischemic wound on the tip of the finger as well as a paronychia a medially. All of this  appears to be closed. 8/6-Patient returns after 3 weeks for follow-up and has a right second  met head plantar callus with a significant area of maceration hiding an ulcer ABI repeated today on right - 1.26 8/13-Patient returns at 1 week, the right second met head plantar wound appears to be slightly better, it is surrounded by the callus area we are using silver alginate 8/20; the patient came back to clinic 2 weeks ago with a wound on the right second metatarsal head. He apparently told me he developed callus in this area but also went away from his custom shoes to a pair of running shoes. Using silver alginate. 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 There might be much we can do to offload this although the patient tells me he has a wheelchair that he is using to try and stay off the wound is much as possible 8/27; right second metatarsal head. Still small punched out area with thick callus and subcutaneous tissue around the wound. We have been using silver collagen. This is always been an issue with this man's wound especially on this foot 9/3; right second met head. Still the same punched-out area with thick callus and subcutaneous tissue around the wound removing the circumference demonstrates repetitively undermining area. I have removed all the subcutaneous tissue associated with this. We have been using silver collagen 9/15; right second metatarsal head. Same punched-out thick callus and subcutaneous tissue around the wound area. Still requiring debridement we have been using silver collagen I changed back to silver alginate X-ray last time showed no evidence of osteomyelitis. Culture showed Klebsiella and he was started and Keflex apparently just 4 days ago 9/24; right second metatarsal head. He is completed the antibiotics I gave him for 10 days. Same punched-out thick callus and subcutaneous tissue around the area. Still requiring debridement. We have been using  silver alginate. The area itself looks swollen and this could be just [place] that comes on this area with walking on a prosthesis on the left however I think he needs an MRI and I am going to order that today. There is not an option to offload this further 10/1; right second plantar metatarsal head. MRI booked for October 6. Generally looking better today using silver alginate 10/8; right second plantar metatarsal head. MRI that was done on 10/6 was negative for osteomyelitis noted prior amputations of the second and fourth toes at the [facility]. Prior amputation of the third toe to the base of the middle phalanx. Prior amputation of the fifth toe to the head of the metatarsal. 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 He does not recall about hearing about this. He did not have any pain but then again he has reduced sensation from diabetic neuropathy 10/15; right second plantar metatarsal head. Still in the same condition callus nonviable subcutaneous tissue which cleans up nicely with debridement but reforms by the next week. The patient tells me that he is offloading this is much as he can including taking his wheelchair to dialysis. We have been using silver alginate, changed to silver collagen today 10/22; right second plantar metatarsal head. Wound measures smaller but still thick callus around this wound. Still requiring debridement 11/5 right second plantar metatarsal head. Less callus around the wound circumference but still requiring debridement. There is still some undermining which I cleaned out the wound looks healthy but still considerable punched out depth with a relatively small circumference to the wound there is no palpable bone no purulent drainage 11/12; right  second plantar metatarsal head. Small wound with thick callus tissue around the wound and significant undermining. We have been using silver collagen after my continuous  debridements of this area 11/19; patient arrives in today with purulent drainage coming out of the wound in the second third met head area on the right foot. Serosanguineous thick drainage. Specimen obtained for culture. 11/21/2019 on evaluation today patient appears to be doing well with regard to his foot ulcer. The good news is is culture came back negative for any bacteria there was no growth. The other good news is his x-ray also appears to be doing well. The foot is also measuring much better than what it was. Overall I am very pleased with how things seem to be progressing. 12/22; patient was in [facility] for several weeks due to the death of a family member. He said he stayed off the wound using silver alginate. 01/03/2020. Patient has a small open area with roughly 0.3 cm of circumferential undermining. The orifice looks smaller. We have been using silver collagen. There is not a wound more aggressive way to offload this area as he has a prosthesis on the other leg 1/21; again the same small area is 2 weeks ago. We have been using silver collagen I change that to endoform today I really believe that this is an offloading issue 2/4; the area on the right third met head. We have been using endoform. 2/11 the area on the right third met head we have been using silver alginate. 2/25; the area on the right third met head. There is much less callus on this today. The patient states he is offloading this even more aggressively. As an example he is taking his wheelchair into dialysis on most days 3/4; right third metatarsal head. Again he has eschar over the surface of the wound which I removed with a #3 curette. Some subcutaneous debris. This has 0.8 cm of direct probing depth. I cannot feel any bone here. I did do a culture the area 3/11; right third metatarsal head. Again an eschared area over the wound which almost makes this look superficial. Again debridement reveals a wound  with probing depth probably not much different from last week there is no palpable bone no palpable purulence. The patient tells me that he is making every effort to offload this area properly. He is taking wheelchair into dialysis etc. There is no option for forefoot offloading or a total contact cast. We used Oasis #1 today 3/18; again callus and eschar over the wound surface. I remove this but still a probing wound although only 3 mm in depth this time. This is an improvement 3/25; again callus and eschar over the wound surface which I removed the wound is much deeper today at 0.7 cm. There is no palpable bone. Because of the appearance of this a culture was done. I am not going to put the Oasis back in this. Concerned about underlying infection. Notable for the fact that he is just finished doxycycline for the last go round of this 3/30; still 0.7 cm in depth which is disappointing. Culture I gave last time showed a few Staph aureus which is methicillin susceptible. This should have been taken care of by the recent course of doxycycline I gave him. We are using silver alginate 4/6; almost 1 cm in depth. We have been using silver alginate with underlying Bactroban. 4/15; about 1 cm in depth still. There is no palpable bone but there is undermining  thick surface again as usual. Our intake nurse noted what looked to be purulent drainage I suppose that could have been Bactroban but I did a culture for CandS 4/20; depth down 0.7 cm. Culture from last time was negative we have been using Bactroban and silver alginate 4/29; depth down 0.5 cm. Using silver alginate. He reports some drainage Objective Constitutional Vitals Time Taken: 1:15 PM, Height: 73 in, Weight: 202 lbs, BMI: 26.6, Temperature: 98.5 F, Pulse: 82 bpm, Respiratory Rate: 18 breaths/min, Blood Pressure: 109/61 mmHg, Capillary Blood Glucose: 125 mg/dl. General Notes: patient stated CBG was 125 General Notes: Wound exam; small  punched-out hole over the right second metatarsal head. This has less depth it 0.5. Patient reports some drainage there is no surrounding erythema but callus around the wound Integumentary (Hair, Skin) No erythema around the wound no purulent drainage. Wound #41 status is Open. Original cause of wound was Gradually Appeared. The wound is located on the Right Metatarsal head second. The wound measures 0.5cm length x 0.4cm width x 0.5cm depth; 0.157cm^2 area and 0.079cm^3 volume. There is Fat Layer (Subcutaneous Tissue) Exposed exposed. There is no tunneling noted, however, there is undermining starting at 12:00 and ending at 9:00 with a maximum distance of 0.3cm. There is a medium amount of serosanguineous drainage noted. The wound margin is well defined and not attached to the wound base. There is large (67-100%) pink, pale granulation within the wound bed. There is no necrotic tissue within the wound bed. 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 An Chemical Cauterization procedure was performed by Ricard Dillon., MD. Post procedure Diagnosis Wound #41: Same as Pre-Procedure Notes: silver nitrate used. Plan Follow-up Appointments: Return Appointment in 1 week. Dressing Change Frequency: Wound #41 Right Metatarsal head second: Do not change entire dressing for one week. Skin Barriers/Peri-Wound Care: Moisturizing lotion - right leg. Wound Cleansing: May shower with protection. - patient to use a cast protector. Primary Wound Dressing: Wound #41 Right Metatarsal head second: Endoform - moisten with hydrogel. Secondary  Dressing: Wound #41 Right Metatarsal head second: Dry Gauze Drawtex - over endoform Other: - felt to offload periwound. Edema Control: Kerlix and Coban - Right Lower Extremity Avoid standing for long periods of time Elevate legs to the level of the heart or above for 30 minutes daily and/or when sitting, a frequency of: - throughout the day. Additional Orders / Instructions: Other: - minimize walking and standing on foot to aid in wound healing and callus buildup. 1. We change the primary dressing to endoform backed with drawtex or calcium alginate 2. Felted around the wound and put the patient in kerlix Coban that he will leave on all week. I am hoping to offload the wound this way 3. Silver alginate to the relatively friable weeping material on the wound bed Electronic Signature(s) Signed: 04/24/2020 5:41:26 PM By: Linton Ham MD Entered By: Linton Ham on 04/24/2020 14:29:43 -------------------------------------------------------------------------------- SuperBill Details Patient Name: Date of Service: Kateri Plummer 04/24/2020 Medical Record Number: [id number] Patient Account Number: [id number] Date of Birth/Sex: Treating RN: [id number] (69 y.o. [name] Primary Care Provider: [name] Other Clinician: Referring Provider: Treating Provider/Extender: Lambert Mody,  Buena Irish in Treatment: 45 Diagnosis Coding ICD-10 Codes Code Description E11.621 Type 2 diabetes mellitus with foot ulcer L97.512 Non-pressure chronic ulcer of other part of right foot with fat layer exposed I70.298 Other atherosclerosis of native arteries of extremities, other extremity L03.115 Cellulitis of right lower limb Facility Procedures The patient participates with Medicare or their insurance follows the Medicare Facility Guidelines: CPT4 Code Description Modifier Quantity [id number] - CHEM CAUT GRANULATION TISS 1 ICD-10 Diagnosis Description L97.512 Non-pressure  chronic ulcer of  other part of right foot with fat layer exposed Physician Procedures : CPT4 Code Description Modifier [id number] - WC PHYS CHEM CAUT GRAN TISSUE ICD-10 Diagnosis Description L97.512 Non-pressure chronic ulcer of other part of right foot with fat layer exposed Quantity: 1 Electronic Signature(s) Signed: 04/24/2020 5:41:26 PM By: Linton Ham MD Entered By: Linton Ham on 04/24/2020 14:29:57

## 2020-05-01 ENCOUNTER — Encounter (HOSPITAL_BASED_OUTPATIENT_CLINIC_OR_DEPARTMENT_OTHER): Payer: Medicare Other | Attending: Internal Medicine | Admitting: Internal Medicine

## 2020-05-01 ENCOUNTER — Other Ambulatory Visit: Payer: Self-pay

## 2020-05-01 DIAGNOSIS — E1122 Type 2 diabetes mellitus with diabetic chronic kidney disease: Secondary | ICD-10-CM | POA: Insufficient documentation

## 2020-05-01 DIAGNOSIS — I132 Hypertensive heart and chronic kidney disease with heart failure and with stage 5 chronic kidney disease, or end stage renal disease: Secondary | ICD-10-CM | POA: Diagnosis not present

## 2020-05-01 DIAGNOSIS — E1151 Type 2 diabetes mellitus with diabetic peripheral angiopathy without gangrene: Secondary | ICD-10-CM | POA: Diagnosis not present

## 2020-05-01 DIAGNOSIS — Z89512 Acquired absence of left leg below knee: Secondary | ICD-10-CM | POA: Diagnosis not present

## 2020-05-01 DIAGNOSIS — E1140 Type 2 diabetes mellitus with diabetic neuropathy, unspecified: Secondary | ICD-10-CM | POA: Diagnosis not present

## 2020-05-01 DIAGNOSIS — L03115 Cellulitis of right lower limb: Secondary | ICD-10-CM | POA: Insufficient documentation

## 2020-05-01 DIAGNOSIS — E11621 Type 2 diabetes mellitus with foot ulcer: Secondary | ICD-10-CM | POA: Diagnosis not present

## 2020-05-01 DIAGNOSIS — N189 Chronic kidney disease, unspecified: Secondary | ICD-10-CM | POA: Diagnosis not present

## 2020-05-01 DIAGNOSIS — L97512 Non-pressure chronic ulcer of other part of right foot with fat layer exposed: Secondary | ICD-10-CM | POA: Insufficient documentation

## 2020-05-01 DIAGNOSIS — I509 Heart failure, unspecified: Secondary | ICD-10-CM | POA: Diagnosis not present

## 2020-05-01 DIAGNOSIS — Z7902 Long term (current) use of antithrombotics/antiplatelets: Secondary | ICD-10-CM | POA: Diagnosis not present

## 2020-05-01 DIAGNOSIS — Z992 Dependence on renal dialysis: Secondary | ICD-10-CM | POA: Insufficient documentation

## 2020-05-01 NOTE — Progress Notes (Signed)
[name], [name] ([id number]) Visit Report for 05/01/2020 HPI Details Patient Name: Date of Service: [name], [name] 05/01/2020 1:45 PM Medical Record Number: [id number] Patient Account Number: [id number] Date of Birth/Sex: Treating RN: 01/27/1951 (69 y.o. [name] Primary Care Provider: [name] Other Clinician: Referring Provider: Treating Provider/Extender: Tanna Savoy in Treatment: 46 History of Present Illness Location: Patient presents with a wound to bilateral feet. Quality: Patient reports experiencing essentially no pain. Severity: Mildly severe wound with no evidence of infection Duration: Patient has had the wound for greater than 2 weeks prior to presenting for treatment HPI Description: The patient is a pleasant 69 yrs old bm here for evaluation of ulcers on the plantar aspect of both feet. 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 He has a left arm graft for access. He has been trying to stay off his feet for weeks but does not seem to have any improvement in the wound. He has been seeing someone at the foot center and was referred to the Wound Care center for further evaluation. 12/30/15 the patient has 3 wounds one over the right first metatarsal head and 2 on the left foot at the left fifth and lleft first metatarsal head. All of these look relatively similar. The one over the left fifth probe to bone I could not prove that any of the others did. I note his MRI in November that did not show osteomyelitis. His peripheral pulses seem robust. All of these underwent surgical debridement to remove callus nonviable skin and subcutaneous tissue 01/06/16; the patient had his sutures removed from the right fifth ray amputation. There may be a small open part of this superiorly but otherwise the incision looks good. Areas over his right first, left first and left fifth metatarsal head all underwent  surgical debridement as a varying degree of callus, skin and nonviable subcutaneous tissue. The area that is most worrisome is the right fifth metatarsal head which has a wound probes precariously close to bone. There is no purulent drainage or erythema 01/13/16; I'm not exactly sure of the status of the right fifth ray amputation site however he follows with Dr. [name] later this week. The area over his right first plantar metatarsal head, left first and fifth plantar metatarsal head are all in the same status. Thick circumferential callus, nonviable subcutaneous tissue. Culture of the left fifth did not culture last week 01/20/16; all of the patient's wounds appear and roughly the same state although his amputation site on the right lateral foot looks better. His wounds over the right first, left first and left fifth metatarsal heads all underwent difficult surgical debridement removing circumferential callus nonviable skin and subcutaneous tissue. There is no overt evidence of infection in these areas. MRI at the end of December of the left foot did not show osteomyelitis, right foot showed osteomyelitis of the right fifth digit he is now status post amputation. 01/27/16 the patient's wounds over his plantar first and fifth metatarsal heads on the left all appear better having been started on a total contact cast last week. They were debridement of circumferential callus and nonviable subcutaneous tissue as was the wound over the first metatarsal head. His surgical incision on the right also had some light surface debridement done. 03/23/2016 -- the patient was doing really well and most of his wounds had almost completely healed but now he came back today with a history of having  a discharge from the area of his right foot on the plantar aspect and also between his first and second toe. He also has had some discharge from the left foot. Addendum: I spoke to the PA [name] at the dialysis  center whose fax number is [phone]. We discussed the infection the patient has and she will put the patient on vancomycin and Fortaz until the final culture report is back. We will fax this as soon as available. 03/30/2016 -- left foot x-ray IMPRESSION:No definitive osteomyelitis noted. X-ray of the right foot -- IMPRESSION: 1. Soft tissue swelling. Prior amputation right fifth digit. No acute or focal bony abnormality identified. If osteomyelitis remains a clinical concern, MRI can be obtained. 2. Peripheral vascular disease. His culture reports have grown an MSSA -- and we will fax this report to his hemodialysis center. He was called in a prescription of oral doxycycline but I have told her not to fill this in as he is already on IV antibiotics. 04/06/2016 -- a few days ago, I spoke to the hemodialysis center nurse who had stopped the IV antibiotics and he was given a prescription for doxycycline 100 mg by mouth twice a day for a week and he is on this at the present time. 05/11/2016 -- he has recently seen his PCP this week and his hemoglobin A1c was 7. He is working on his paperwork to get his orthotic shoes. 05/25/2016 -- -- x-ray of the left foot IMPRESSION:No acute bony abnormality. No radiographic changes of acute osteomyelitis. No change since prior study. X-ray of the right foot -- IMPRESSION: Postsurgical changes are seen involving the fifth toe. No evidence of acute osteomyelitis. he developed a large blister on the medial part of his right foot and this opened out and drain fluid. 06/01/2016 -- he has his MRI to be done this afternoon. 06/15/2016 -- MRI of the left forefoot without contrast shows 2 separate regions of cutaneous and subcutaneous edema and possible ulceration and blistering along the ball of the foot. No obvious osteomyelitis identified. MRI of the right foot showed cutaneous and subcutaneous thickening plantar to the first digit sesamoid with an ulcer crater but no  underlying osteomyelitis is identified. 06/22/2016 -- the right foot plantar ulcer has been draining a lot of seropurulent material for the last few days. 06/30/2016 -- spoke to the dialysis center and I believe I spoke to [place] a PA at the center who discussed with [name] and agreed to putting Legrand Como on vancomycin until his cultures arrive. On review of his culture report no WBCs were seen or no organisms were seen and the culture was reincubated for better growth. The final report is back and there were no predominant growth including Streptococcus or Staphylococcus. Clinically though he has a lot of drainage from both wounds a lot of undermining and there is further blebs on the left foot towards the interspace between his first and second toe. 08/10/2016 -- the culture from the right foot showed normal skin flora and there was no Staphylococcus aureus was group A streptococcus isolated. 09/21/2016 -- -- MRI of the right foot was done on 09/13/2016 - IMPRESSION: Findings most consistent with acute osteomyelitis throughout the great toe, sesamoid bones and plantar aspect of the head of the first metatarsal. Fluid in the sheath of the flexor tendon of the great toe could be sympathetic but is worrisome for septic tenosynovitis. First MTP joint effusion worrisome for septic joint. He was admitted to the hospital on 09/11/2016  and treated for a fever with vancomycin and cefepime. He was seen by Dr. Bobby Rumpf of infectious disease who recommended 6 weeks treatment with vancomycin and ceftazidime with his hemodialysis and to continue to see as in the wound clinic. Vascular consult was pending. The patient was discharged home on 09/14/2016 and he would continue with IV antibiotics for 6 weeks. 09/28/16 wound appears reasonably healthy. Continuing with total contact cast 10/05/16 wound is smaller and looking healthy. Continue with total contact cast. He continues on IV antibiotics 10/12/2016 -- he  has developed a new wound on the dorsal aspect of his left big toe and this is a superficial injury with no surrounding cellulitis. He has completed 30 days of IV antibiotics and is now ready to start his hyperbaric oxygen therapy as per his insurance company's recommendation. 10/19/2016 -- after the cast was removed on the right side he has got good resolution of his ulceration on the right plantar foot. he has a new wound on the left plantar foot in the region of his fourth metatarsal and this will need sharp debridement. 10/26/2016 -- Xray of the right foot complete: IMPRESSION: Changes consistent with osteomyelitis involving the head of the first metatarsal and base of the first proximal phalanx. The sesamoid bones are also likely involved given their positioning. 11/02/2016 -- there still awaiting insurance clearance for his hyperbaric oxygen therapy and hopefully he will begin treatment soon. 11/09/2016 -- started with hyperbaric oxygen therapy and had some barotrauma to the right ear and this was seen by ENT who was prescribed Afrin drops and would probably continue with HBO and he is scheduled for myringotomy tubes on Friday 11/16/2016 -- he had his myringotomy tubes placed on Friday and has been doing much better after that with some fluid draining out after hyperbaric treatment today. The pain was much better. 11/30/2016 -- over the last 2 days he noticed a swelling and change of color of his right second toe and this had been draining minimal fluid. 12/07/2016 -- x-ray of the right foot -- IMPRESSION:1. Progressive ulceration at the distal aspect of the second digit with significant soft tissue swelling and osseous changes in the distal phalanx compatible with osteomyelitis. 2. Chronic osteomyelitis at the first MTP joint. 3. Ulcerations at the second and third toes as well without definite osseous changes. Osteomyelitis is not excluded. 4. Fifth digit amputation. On 12/01/2016 I spoke to  Dr. Bobby Rumpf, the infectious disease specialist, who kindly agreed to treat this with IV antibiotics and he would call in the order to the dialysis center and this has been discussed in detail with the patient who will make the appropriate arrangements. The patient will also book an appointment as soon as possible to see Dr. [name] in the office. Of note the patient has not been on antibiotics this entire week as the dialysis center did not receive any orders from Dr. [name]. I got in touch with Dr. [name] who tells [name] the patient has an appointment to see him this coming Wednesday. 12/14/2016 -- the patient is on ceftazidime and vancomycin during his dialysis, and I understand this was put on by his nephrologist Dr. Raliegh Ip possibly after speaking with infectious disease Dr. [name] 12/23/16; patient was on my schedule today for a wound evaluation as he had difficulties with his schedule earlier this week. I note that he is on vancomycin and ceftazidine at dialysis. In spite of this he arrives today with a new wound on the base of the  right second toe this easily probes to bone. The known wound at the tip of the second toe With this area. He also has a superficial area on the medial aspect of the third toe on the side of the DIP. This does not appear to have much depth. The area on the plantar left foot is a deep area but did not probe the bone 12/28/16 -- he has brought in some lab work and the most recent labs done showed a hemoglobin of 12.1 hematocrit of 36.3, neutrophils of 51% WBC count of 5.6, BN of 53, albumin of 4.1 globulin of 3.7, vancomycin 13 g per mL. 01/04/2017 -- he saw Dr. [name] who has recommended a amputation of the right second toe and he is awaiting this date. He sees Dr. Bobby Rumpf of infectious disease tomorrow. The bone culture taken on 12/28/2016 had no growth in 2 days. 01/11/2017 -- was seen by Dr. Bobby Rumpf regarding the management and has recommended a eval  by vascular surgeons. He recommended to continue the antibiotics during his hemodialysis. Xray of the left foot -- IMPRESSION: No acute fracture, dislocation, or osseous erosion identified. 01/18/2017 -- he has his vascular workup later today and he is going to have his right foot second toe amputation this coming Friday by Dr. [name]. We have also put in for a another 30 treatments with hyperbaric oxygen therapy 01/25/2017 -- I have reviewed Dr. Donnetta Hutching is vascular report from last week where he reviewed him and thought his vascular function was good enough to heal his amputation site and no further tests were recommended. He also had his orthopedic related to surgery which is still pending the notes and we will review these next week. 02/01/2017 -- the operative remote of Dr. 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 02/08/2017 --he was seen by Dr. [name] on 02/03/2017 -- patient is on [place]. after a thorough review he had recommended to continue with antibiotics during hemodialysis. The patient was seen by Dr. [name], but I do not find any follow-up note on the electronic medical record. he has removed the dressing over the right foot to remove the sutures and asked him to see him back in a month's time 02/22/2017 -- patient has been febrile and has been having symptoms of the upper respiratory tract infection but has not been checked for the flu and it's been over 4 days now. He says he is feeling better today. Some drainage between his left first and second toe and this needed to be looked at 03/08/2017 -- was seen by Dr. Bobby Rumpf on 03/07/2017 after review he will stop his antibiotics and see him back in a month to see if he is feeling well. 03/15/2017 -- he has completed his course of hyperbaric oxygen therapy and is doing fine with his health otherwise. 03/22/2017 -- his nutritionist at the dialysis center has recommended a protein supplement to help build his collagen and we will prescribe this for him when he has the details 05/03/2017 -- he recently noticed the area on the plantar aspect of his fifth metatarsal head which opened out and had minimal drainage. 05/10/2017 -- -- x-ray of  the left foot -- IMPRESSION:1. No convincing conventional radiographic evidence of active osteomyelitis.2. Active soft tissue ulceration at the tip of the fifth digit, and at the lateral aspect of the foot adjacent the base of the fifth metatarsal. 3. Surgical changes of prior fourth toe amputation. 4. 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 5. Small vessel atherosclerotic vascular calcifications. 6. 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 ======= Readmission after 5 weeks: 06/15/2017 -- the patient returns after 5 weeks having had an MRI done on 05/17/2017 MRI of the left foot without contrast showed soft tissue ulcer overlying the base of the fifth metatarsal. 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 No drainable fluid collection to suggest an abscess. He was in hospital between 05/16/2017 and 05/25/2017 -- and on discharge was asked to follow-up with Dr. [name] and Dr. [name]. He was treated 2 weeks post discharge with vancomycin plus ceftriaxone with hemodialysis and oral metronidazole 500 mg 3 times a day. 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 last hemoglobin A1c was 6.7% He had a postoperative MR of the foot on  05/23/2017 -- which showed no new areas of cortical bone loss, edema or soft tissue ulceration to suggest osteomyelitis. the patient has completed his course of IV antibiotics during dialysis and is to see the infectious disease doctor tomorrow. Dr. [name] had seen him after suture removal and asked him to keep the wound with a dry dressing 06/21/2017 -- he was seen by Dr. Bobby Rumpf of infectious disease on 06/16/2017 -- he stopped his Flagyl and will see him back in 6 weeks. He was asked to follow-up with Dr. [name] and with the wound clinic clinic. 07/05/17; bone biopsy from last time showed acute osteomyelitis. This is from the reminiscent in the left fifth metatarsal. Culture result is apparently still pending [holding for anaerobe}. From my understanding in this case this is been a progressive necrotic wound which is deteriorated markedly over the last 3 weeks since he returned here. He now has a large area of exposed bone which was biopsied and cultured last week. Dr. Graylon Good has put him on vancomycin and Fortaz during his hemodialysis and Flagyl orally. He is to see Dr. [name] next week 07/19/2017 -- the patient was reviewed by Dr. [name] of orthopedics who reviewed the case in detail and agreed with the plan to continue with IV antibiotics, aggressive wound care and hyperbaric oxygen therapy. He would see him back in 3 weeks' time 08/09/2017 -- saw Dr. Bobby Rumpf on 08/03/2017 -he was restarted on his antibiotics for 6 weeks which included vanco, ceftaz and Flagyl. he recommended continuing his antibiotics for 6 weeks and reevaluate his completion date. He would continue with wound care and hyperbaric oxygen therapy. 08/16/2017 -- I understand he will be completing his 6 weeks of antibiotics sometime later this week. 09/19/2017 -- he has been without his wound VAC for the last week due to lack of supplies. He has been packing his wound with silver alginate. 10/04/2017 -- he has an  appointment with Dr. [name] tomorrow and hence we will not apply the wound VAC after his dressing changes today. 10/11/2017 -- he was seen by Dr. Bobby Rumpf on 10/05/2017, noted that the patient was currently off antibiotics, and after thorough review he recommended he follow-up with the wound care as  per plan and no further antibiotics at this point. 11/29/17; patient is arrived for the wound on his left lateral foot.[name] He is completed IV antibiotics and 2 rounds of hyperbaric oxygen for treatment of underlying osteomyelitis. He arrives today with a surface on most of the wound area however our intake nurse noted drainage from the superior aspect. This was brought to my attention. 12/13/2017 -- the right plantar foot had a large bleb and once the bleb was opened out a large callus and subcutaneous debris was removed and he has a plantar ulcer near the fourth metatarsal head 12/28/17 on evaluation today patient appears to be doing acutely worse in regard to his left foot. The wound which has been appearing to do better as now open up more deeply there is bone palpable at the base of the wound unfortunately. He tells [name] that this feels "like it did when he had osteomyelitis previously" he also noted that his second toe on the left foot appears to be doing worse and is swollen there does appear to be some fluid collected underneath. His right foot plantar ulcer appears to be doing somewhat better at this point and there really is no complication at the site currently. No fevers, chills, nausea, or vomiting noted at this time. Patient states that he normally has no pain at this site however. T oday he is having significant pain. 01/03/18; culture done last week showed methicillin sensitive staph aureus and group B strep. He was on [place] however he arrives today with a fever of 101. He has a functional dialysis shunt in his left arm but has had no pain here. No cough. He still makes some urine no dysuria  he did have abdominal pain nausea and diarrhea over the weekend but is not had any diarrhea since yesterday. Otherwise he has no specific complaints 01/05/18; the patient returns today in follow-up for his presentation from 1/8. At that point he was febrile. I gave him some Levaquin adjusted for his dialysis status. He tells [name] the fever broke that night and it is not likely that this was actually a wound infection. He is gone on to have an MRI of the left foot; this showed interval development of an abnormal signal from the base of the third and fourth metatarsal and in the cuboid reminiscent consistent with osteomyelitis. There is no mention of the left second toe I think which was a concern when it was ordered. The patient is taking his Levaquin 01/10/18; the patient has completed his antibiotics today. The area on the left lateral foot is smaller but it still probes easily to bone. He has underlying osteomyelitis here and sees Dr. Megan Salon of infectious disease this week The area on the right third plantar metatarsal head still requires debridement not much change in dimensions He has a new wound on the medial tip of his right third toe again this probes to bone. He only noticed this 2 days ago 01/17/18; the patient saw Dr. [name] last week and he is back on Vanco and Fortaz at dialysis. 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 We are previously seeing him for a wound also on the base of the fourth med head on the right. X-ray of this area did not show osteomyelitis in relation to a new wound on the tip of his right third toe. Again this probes  to bone. Finally his second toe on the left which didn't really show anything on the MRI at least no report appears to have a separated cutaneous area which I think is going to come right off the tip of his toe and leave exposed bone. Whether  this is infectious or ischemic I am not clear Culture I did from the third toe last week which was his new wound was negative. Plain x-ray on the right foot did not show osteomyelitis in the toes 01/24/18; the patient is going went to see Dr. [name]. He is going to have a amputation of the left second and right third toe although paradoxically the left second toe looks better than last week. We have treating a deep probing wound on the left lateral foot and the area over the third metatarsal head on the right foot. 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 Also a debridement including bone of the left lateral foot and a closure which is still sutured. This is a bit surprising. Also apparent debridement of the base of the right third toe wound. 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 I'm not sure exactly where we are in the frame of things here. The wound on the left lateral foot is still sutured also the amputation sites of the left second and right third toe. He is been using Xeroform to the sutured areas silver alginate to the right foot 02/14/18; he continues on Rocephin at dialysis for osteomyelitis. I still don't have a good sense of where we are in the treatment duration. The wounds on the left lateral foot had the sutures removed and this clearly still probes deeply. We debrided the area with a #5 curet. We'll use silver alginate to the wound the area over the right third met head also required debridement of callus skin and subcutaneous tissue and necrotic debris over the wound surface. This tunnels superiorly but I did not unroofed this today. The areas on the tip of his toes have a crusted surface eschar I did not debridement this either. 02/21/18; he continues  on Rocephin at dialysis for osteomyelitis. I don't have a good sense of where we are and the treatment duration. The wounds on the left lateral foot is callused over on presentation but requires debridement. The area on the right third med head plantar aspect actually is measuring smaller 02/28/18;patient is on Vanco and Fortaz at dialysis, I'm not sure why I thought this was Rocephin above unless it is been changed. I don't have a good sense of time frame here. Using silver collagen to the area on the left lateral foot and right third med head plantar foot 03/07/18; patient is completing bank and Fortaz at dialysis soon. He is using Silver collagen to the area on the left lateral foot and the right third metatarsal head. He has a smaller superficial wound distal to the left lateral foot wound. 03/14/18-he is here in follow-up evaluation for multiple ulcerations to his bilateral feet. He presents with a new ulceration to the plantar aspect of the left foot underneath the blister, this was deroofed to reveal a partial thickness ulcer. He is voicing no complaints or concerns, tolerated dialysis yesterday. We will continue with same treatment plan and he will  follow-up next week 03/21/18; the patient has a area on the lateral left foot which still has a small probing area. The overall surface area of the wound is better. He presented with a new ulceration on the left foot plantar fifth metatarsal head last week. The area on the right third metatarsal head appears smaller.using silver alginate to all wound areas. The patient is changing the dressing himself. He is using a Darco forefoot offloading are on the right and a healing sandal on the left 03/28/18; original wound left lateral foot. Still nowhere close to looking like a heeling surface secondary wound on the left lateral foot at the base of his question fifth metatarsal which was new last week Right third metatarsal head. We have been using silver  alginate all wounds 04/04/18; the original wound on the left lateral foot again heavy callus surrounding thick nonviable subcutaneous tissue all requiring debridement. The new wound from 2 weeks ago just near this at the base of the fifth metatarsal head still looks about the same Unfortunately there is been deterioration in the third medical head wound on the right which now probes to bone. I must say this was a superficial wound at one point in time and I really don't have a good frame of reference year. I'll have to go back to look through records about what we know about the right foot. 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 He is also been to see Dr. [name]. Somewhere in here as somebody is ordered a VASCULAR evaluation which was done on 03/22/18. On the right is anterior tibial artery was monophasic triphasic at the posterior tibial artery. On the left anterior tibial artery monophasic posterior tibial artery monophasic. ABI in the right was 1.43 on the left 1.21. TBIs on the right at 0.41 and on the left at 0.32. A vascular consult was recommended and I think has been arranged. 04/11/18; Mr. Zanders has not had an MRI of the right foot since 2017. Recent x-ray of the right foot done in January and February was negative. However he has had a major deterioration in the wound over the third met head. He is completed antibiotics last week at dialysis [place]. I think he is going to see vascular in follow-up. The areas on the left lateral foot and left plantar fifth metatarsal head both look satisfactory. I debrided both of these areas although the tissue here looks good. The area on the left lateral foot once probe to bone it certainly does not do that now 04/18/18; MRI of the right foot is on Thursday. He has what looks to be serosanguineous purulent drainage coming out of the wound over the  right third metatarsal head today. He completed antibiotics bank and Fortaz 2 weeks ago at dialysis. He has a vascular follow-up with regards to his arterial insufficiency although I don't exactly see when that is booked. The areas on the left foot including the lateral left foot and the plantar left fifth metatarsal head look about the same. 04/25/18-He is here in follow-up evaluation for bilateral foot ulcers. MRI obtained was negative for osteomyelitis. Wound culture was negative. We will continue with same treatment plan he'll follow-up next week 05/02/18; the right plantar foot wound over the third metatarsal head actually looks better than when I last saw this. His MRI was negative for osteomyelitis wound culture was negative. On the left plantar  foot both wounds on the plantar fifth metatarsal head and on the lateral foot both are covered in a very hard circumferential callus. 05/09/18; right plantar foot wound over the third metatarsal head stable from last week. Left plantar foot wound over the fifth metatarsal head also stable but with callus around both wound areas The area on the left lateral foot had thick callus over a surface and I had some thoughts about leaving this intact however it felt boggy. We've been using silver alginate all wounds 05/16/18; since the patient was last here he was hospitalized from 5/15 through 5/19. 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 This grew MRSA. He was placed on vancomycin.MRI of the left foot suggested "progressive" osteomyelitis and bone destruction of the cuboid and the base of the fifth metatarsal. Stable erosive changes of the base of the second metatarsal. I'm wondering if they're aware that he had surgery and debridement in the area of the underlying bone previously by Dr. [name]. In any case, He was also revascularized with an angioplasty  and stenting of the left tibial peroneal trunk and angioplasty of the left posterior tibial artery. He is on vancomycin at dialysis. He has a 2 week follow-up with infectious disease. He has vascular surgery follow-up. He was started on Plavix. 05/23/18; the patient's wound on the lateral left foot at the level of the fifth metatarsal head and the plantar wound on the plantar fifth metatarsal head both look better. The area on the right third metatarsal head still has depth and undermining. We've been using silver alginate. He is on vancomycin. He has been revascularized on the left 05/30/18; he continues on vancomycin at dialysis. Revascularized on the left. The area on the left lateral foot is just about closed. Unfortunately the area over the plantar fifth metatarsal head undermining superiorly as does the area over the right third metatarsal head. Both of these significantly deteriorated from last week. He has appointments next week with infectious disease and orthopedics I delayed putting on a cast on the left until those appointments which therefore we made we'll bring him in on Friday the 14th with the idea of a cast on the left foot 06/06/18; he continues on vancomycin at dialysis. Revascularized on the left. He arrives today with a ballotable swelling just above the area on the left lateral foot where his previous wound was. He has no pain but he is reasonably insensate. The difficult areas on the plantar left fifth metatarsal head looks stable whereas the area on the right third plantar metatarsal head about the same as last week there is undermining here although I did not unroofed this today. He required a considerable debridement of the swelling on the left lateral foot area and I unroofed an abscess with a copious amount of brown purulent material which I obtained for culture. I don't believe during his recent hospitalization he had any further imaging although I need to review this. Previous  cultures from this area done in this clinic showed MRSA, I would be surprised if this is not what this is currently even though he is on vancomycin. 06/13/18; he continues on vancomycin at dialysis however he finishes this on Sunday and then graduates the doxycycline previously prescribed by Dr. [name]. The abscess site on the lateral foot that I unroofed last time grew moderate amounts of methicillin-resistant staph aureus that is both  vancomycin and tetracycline sensitive so we should be okay from that regard. He arrives today in clinic with a connection between the abscess site and the area on the lateral foot. We've been using silver alginate to all his wound areas. The right third metatarsal head wound is measuring smaller. Using silver alginate on both wound areas 06/20/18; transitioning to doxycycline prescribed by Dr. [name]. The abscess site on the lateral foot that I unroofed 2 weeks ago grew MRSA. That area has largely closed down although the lateral part of his wound on the fifth metatarsal head still probes to bone. I suspect all of this was connected. On the right third metatarsal head smaller looking wound but surrounded by nonviable tissue that once again requires debridement using silver alginate on both areas 06/27/18; on doxycycline 100 twice a day. The abscess on the lateral foot has closed down. Although he still has the wound on the left fifth metatarsal head that extends towards the lateral part of the foot. The most lateral part of this wound probes to bone Right third metatarsal head still a deep probing wound with undermining. Nevertheless I elected not to debridement this this week If everything is copious static next week on the left I'm going to attempt a total contact cast 07/04/18; he is tolerating his doxycycline. The abscess on the left lateral foot is closed down although there is still a deep wound here that probes to bone. We will use silver collagen under the total  contact cast Silver collagen to the deep wound over the right third metatarsal head is well 07/11/18; he is tolerating doxycycline. The left lateral fifth plantar metatarsal head still probes deeply but I could not probe any bone. We've been using silver collagen and this will be the second week under the total contact cast Silver collagen to deep wound over the right third metatarsal head as well 07/18/18; he is still taking doxycycline. The left lateral fifth plantar metatarsal this week probes deeply with a large amount of exposed bone. Quite a deterioration. He required extensive debridement. Specimens of bone for pathology and CNS obtained Also considerable debridement on the right third metatarsal head 07/25/18; bone for pathology last week from the lateral left foot showed osteomyelitis. CandS of the bone Enterobacter. And Klebsiella. He is now on ciprofloxacin and the doxycycline is stopped [Dr. [name] of infectious disease] area He has an appointment with Dr. [name] tomorrow I'm going to leave the total contact cast off for this week. May wish to try to reapply that next week or the week after depending on the wound bed looks. I'm not sure if there is an operative option here, previously is followed with Dr. [name] 08/01/18 on evaluation today patient presents for reevaluation. He has been seen by infectious disease, Dr. [name], and he has placed the patient back on Ceftaz currently to be given to him at dialysis three times a week. 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 Fortunately there does not appear to be the evidence of infection at this point in time. Overall the patient has been tolerating the dressing changes without complication. We are no longer due lives in the cast of left foot secondary to the infection obviously. 08/10/18 on evaluation today patient appears to be doing rather well all things considered in  regard to his left plantar foot ulcer. He does have a significant callous on the lateral portion of his left  foot he wonders if I can help clean that away to some degree today. Fortunately he is having no evidence of infection at this time which is good news. No fevers chills noted. With that being said his right plantar foot actually does have a significant callous buildup around the wound opening this seems to not be doing as well as I would like at this point. I do believe that he would benefit from sharp debridement at this site. Subsequently I think a total contact cast would be helpful for the right foot as well. 08/17/18; the patient arrived today with a total contact cast on the right foot. This actually looks quite good. He had gone to see Dr. Megan Salon of infectious disease about the osteomyelitis on the left foot. I think he is on IV Fortaz at dialysis although I am not exactly sure of the rationale for the Fortaz at the time of this dictation. He also arrived in today with a swelling on the lateral left foot this is the site of his original wounds in fact when I first saw this man when he was under Dr. Ardeen Garland care I think this was the site of where the wound was located. It was not particularly tender however using a small scalpel I opened it to [place] some moderate amount of purulent drainage. 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 Last MRI was in May that showed progressive osteomyelitis and bone destruction of the cuboid and base of the fifth metatarsal. Last week he had an abscess over this same area this was removed. Culture was negative. We are continuing with a total contact cast to the area on the third metatarsal head on the right and making some good progress here. The patient asked [name] again about renal transplant. He is not on the list because of the  open wounds. 08/31/18; apparently the patient had an interruption in the [place] but he is now back on this at dialysis. Apparently there was a misunderstanding and Dr. Crissie Figures orders. Due to have the MRI of the left foot tonight. The area on the right foot continues to have callus thick skin and subcutaneous tissue around the wound edge that requires constant debridement however the wound is smaller we are using a total contact cast in this area. Alginate all wounds 09/07/18; without much surprise the MRI of his left foot showed osteomyelitis in the reminiscent of his fourth metatarsal but also the third metatarsal. She arrives in clinic today with the right foot and a total contact cast. There was purulent drainage coming out of the wound which I have cultured. Marked deterioration here with undermining widely around the wound orifice. Using pickups and a scalpel I remove callus and subcutaneous tissue from a substantial new opening. In a similar fashion the area on the left lateral foot that was blistered last week I have opened this and remove skin and subcutaneous tissue from this area to expose a obvious new wound. I think there is extension and communication between all of this on the left foot. The patient is on Fortaz at dialysis. Culture done of the right foot. We are clearly not making progress here. We have made him an appointment with Dr. [name] of orthopedics. I think an amputation of the left leg may be discussed. I don't think there is anything that can be done with foot salvage.  09/14/18; culture from the right foot last time showed a very resistant MRSA. This is resistant to doxycycline. I'm going to try to get linezolid at least for a week. He does not have a appointment with Dr. [name] yet. 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 I'll send a message to Dr. [name] of infectious disease. I want the patient to see Dr. [name]  to go over the pros and cons of an amputation which I think will be a BKA. The patient had a question about whether this is curative or not. I told him that I thought it would be although spread of staph aureus infection is not unheard of. MRI of the right foot was done at the end of April 2019 did not show osteomyelitis. The patient last saw Dr. [name] on 9/3. I'll send Dr. [name] a message. I would really like him to weigh the pros and cons of a BKA on the left otherwise he'll probably need IV antibiotics and perhaps hyperbaric oxygen again. He has not had a good response to this in the past. His MRI earlier this month showed progressive damage in the remnants of the fourth and third metatarsal 09/21/2018; sees Dr. [name] of orthopedics next week to discuss a left BKA in response to the osteomyelitis in the left foot. Using silver alginate to all wound areas. He is completing the Zyvox I did put in for him after last culture showed MRSA 09/29/2018; patient saw Dr. [name] of orthopedics discussed the osteomyelitis in the left foot third and fourth metatarsals. He did not recommend urgent surgery but certainly stated the only surgical option would be a BKA. He is communicating with Dr. [name]. The problem here is the instability of the areas on his foot which constantly generate draining abscesses. I will communicate with Dr. [name] about this. He has an appointment on 10/23 10/05/2018; patient sees Dr. [name] on 10/23. I have sent him a secure message not ordered any additional antibiotics for now. Patient continues to have 2 open areas on the left plantar foot at the fifth metatarsal head and the lateral aspect of the foot. These have not changed all that much. The area on the right third met head still has thick callus and surrounding subcutaneous tissue we have been using silver alginate 10/12/2018; patient sees Dr. [name] or colleague on 10/23. 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 The area on the right plantar foot is just about closed. The superior wound on the left fifth metatarsal head is callused over but I am not sure if this is closed. The area below it is about the same. Considerable amount of callus on the lateral foot. We have been using silver alginate to the wounds 10/19/2018; the patient saw Dr. [name] on 10/22. He wishes to try and continue to save the left foot. He has been given 6 weeks of oral ciprofloxacin. We continue to put a total contact cast on the right foot. The area over the fifth metatarsal head on the left is callused over/perhaps healed but I did not remove the callus to find out. He still has the wound on the left lateral midfoot requiring debridement. We have been using silver alginate to all wound areas 10/26/2018 On the left foot our intake nurse noted some purulent drainage from the inferior wound [currently the only one that is not callused  over] On the right foot even though he is in total contact cast a considerable amount of thick black callus and surface eschar. On this side we have been using silver alginate under a total contact cast. he remains on ciprofloxacin as prescribed by Dr. [name] 11/02/2018; culture from last week which is done of the probing area on the left midfoot wound showed MRSA "few". I am going to need to contact Dr. [name] which I will do today I think he is going to need IV antibiotics again. The area on the left foot which was callused on the side is clearly separating today all of this was removed a copious amounts of callus and necrotic subcutaneous tissue. The area on the right plantar foot actually remained healthy looking with a healthy granulated base. We are using silver alginate to all wound areas 11/09/2018; unfortunately neither 1 of the patient's wounds areas looks at all satisfactory. On the left he has considerable necrotic debris from the  plantar wound laterally over the foot. I removed copious amounts of material including callus skin and subcutaneous tissue. On the right foot he arrives with undermining and frankly purulent drainage. Specimen obtained for culture and debridement of the callused skin and subcutaneous tissue from around the circumference. Because of this I cannot put him back in a total contact cast but to be truthful we are really unfortunately not making a lot of progress I did put in a secure text message to Dr. [name] wondering about the MRSA on the left foot. He suggested vancomycin although this is not started. I was left wondering if he expected [name] to send this into dialysis. Has we have more purulence on the right side wait for that result before calling dialysis 11/16/2018; I called dialysis earlier this week to get the vancomycin started. I think he got the first dose on Tuesday. The patient tells [name] that he fell earlier this week twisting his left foot and ankle and he is swelling. He continues to not look well. He had blood cultures done at dialysis on Tuesday he is not been informed of the results. 12/07/2018; the patient was admitted to hospital from 11/22/2018 through 12/02/2018. He underwent a left BKA. Apparently had staph sepsis. In the meantime being off the right foot this is closed over. He follows up with Dr. 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 Subsequent to this he actually had multiple falls in fact he tells [name] to following which calls the wound to be his apparently. He has been seeing Dr. [name] and his physician assistant in the interim. They actually did want him to come back for reevaluation to see if there's anything we can do for [name] wound care perspective the help this area to heal more appropriately. The patient states that he does not have a tremendous amount of pain  which is good news. No fevers, chills, nausea, or vomiting noted at this time. We have gotten approval from Dr. [name] office had [place] for [place] to treat the patient for his stop wound. This is due to the fact that the patient was in the 90 day postop global. 1/21; the patient does not have an open wound on the right foot although he does have some pressure areas that will need to be padded when he is transferring. The dehisced surgical wound looks clean although there is some undermining of 1.5 cm  superiorly. 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 This appears healthy but there is undermining. TheraSkin #1 2/11; TheraSkin #2 2/25; TheraSkin #3. Wound is measuring smaller 3/10; TheraSkin #4 wound is measuring smaller 3/24; TheraSkin #5 wound is measuring smaller and looks healthy. 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 He has no open area on the right foot although he does have some callused area over the original third metatarsal head wound. The third metatarsal is also subluxed on the right foot. I have warned him today that he cannot consider wearing a prosthesis for at least a month but he can go for measurements. He is going to need to keep the right foot padded is much as possible in his diabetic shoe indefinitely READMISSION 06/12/2019 Mr. Mendez is a type II diabetic on dialysis. He has been in this clinic multiple times with wounds on his bilateral lower extremities. 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 He also has a history of wounds on his right foot but tells [name] that everything is going well here. He actually came in here with his prosthesis walking with a cane. We were all really quite gratified to see this He states over the last several weeks he has had a  painful area at the tip of the left third finger. His dialysis shunt in his is in the left upper arm and this particularly hurts during dialysis when his fingers get numb and there is a lot of pain in the tip of his left third finger. I believe he is seen vascular surgery. He had a Doppler done to evaluate for a dialysis steal syndrome. He is going for a banding procedure 1 week from today towards the left AV fistula. I think if that is unsuccessful they will be looking at creating a new shunt. He had a course of doxycycline when he which he finished about a week ago. He has been using Bactroban to the finger 6/25; the patient had his banding procedure and things seem to be going better. He is not having pain in his hand at dialysis. The area on the tip of his finger seems about closed. He did however have a paronychia I the last time I saw him. I have been advising him to use topical Bactroban washing the finger. Culture I did last time showed a few staph epidermidis which I was willing to dismiss as superficial skin contaminant. He arrives today with the finger wound looking better however the paronychia a seems worse 7/9; 2-week follow-up. He does not have an open wound on the tip of his third left finger. I thought he had an initial ischemic wound on the tip of the finger as well as a paronychia a medially. All of this appears to be closed. 8/6-Patient returns after 3 weeks for follow-up and has a right second met head plantar callus with a significant area of maceration hiding an ulcer ABI repeated today on right - 1.26 8/13-Patient returns at 1 week, the right second met head plantar wound appears to be slightly better, it is surrounded by the callus area we are using silver alginate 8/20; the patient came back to clinic 2 weeks ago with a wound on the right second metatarsal head. He apparently told [name]  he developed callus in this area but also went away from his custom shoes to a pair of  running shoes. Using silver alginate. 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 There might be much we can do to offload this although the patient tells [name] he has a wheelchair that he is using to try and stay off the wound is much as possible 8/27; right second metatarsal head. Still small punched out area with thick callus and subcutaneous tissue around the wound. We have been using silver collagen. This is always been an issue with this man's wound especially on this foot 9/3; right second met head. Still the same punched-out area with thick callus and subcutaneous tissue around the wound removing the circumference demonstrates repetitively undermining area. I have removed all the subcutaneous tissue associated with this. We have been using silver collagen 9/15; right second metatarsal head. Same punched-out thick callus and subcutaneous tissue around the wound area. Still requiring debridement we have been using silver collagen I changed back to silver alginate X-ray last time showed no evidence of osteomyelitis. Culture showed Klebsiella and he was started and Keflex apparently just 4 days ago 9/24; right second metatarsal head. He is completed the antibiotics I gave him for 10 days. Same punched-out thick callus and subcutaneous tissue around the area. Still requiring debridement. We have been using silver alginate. The area itself looks swollen and this could be just Laforce that comes on this area with walking on a prosthesis on the left however I think he needs an MRI and I am going to order that today. There is not an option to offload this further 10/1; right second plantar metatarsal head. MRI booked for October 6. Generally looking better today using silver alginate 10/8; right second plantar metatarsal head. MRI that was done on 10/6 was negative for osteomyelitis noted prior amputations of the second and fourth toes at the [facility]. Prior amputation of the third toe to the  base of the middle phalanx. Prior amputation of the fifth toe to the head of the metatarsal. 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 He does not recall about hearing about this. He did not have any pain but then again he has reduced sensation from diabetic neuropathy 10/15; right second plantar metatarsal head. Still in the same condition callus nonviable subcutaneous tissue which cleans up nicely with debridement but reforms by the next week. The patient tells [name] that he is offloading this is much as he can including taking his wheelchair to dialysis. We have been using silver alginate, changed to silver collagen today 10/22; right second plantar metatarsal head. Wound measures smaller but still thick callus around this wound. Still requiring debridement 11/5 right second plantar metatarsal head. Less callus around the wound circumference but still requiring debridement. There is still some undermining which I cleaned out the wound looks healthy but still considerable punched out depth with a relatively small circumference to the wound there is no palpable bone no purulent drainage 11/12; right second plantar metatarsal head. Small wound with thick callus tissue around the wound and significant undermining. We have been using silver collagen after my continuous debridements of this area 11/19; patient arrives in today with purulent drainage coming out of the wound in the second third met head area on the right foot. Serosanguineous thick drainage. Specimen obtained for culture. 11/21/2019 on evaluation today patient appears to be doing well with  regard to his foot ulcer. The good news is is culture came back negative for any bacteria there was no growth. The other good news is his x-ray also appears to be doing well. The foot is also measuring much better than what it was. Overall I am very pleased with how things seem to be progressing. 12/22;  patient was in [facility] for several weeks due to the death of a family member. He said he stayed off the wound using silver alginate. 01/03/2020. Patient has a small open area with roughly 0.3 cm of circumferential undermining. The orifice looks smaller. We have been using silver collagen. There is not a wound more aggressive way to offload this area as he has a prosthesis on the other leg 1/21; again the same small area is 2 weeks ago. We have been using silver collagen I change that to endoform today I really believe that this is an offloading issue 2/4; the area on the right third met head. We have been using endoform. 2/11 the area on the right third met head we have been using silver alginate. 2/25; the area on the right third met head. There is much less callus on this today. The patient states he is offloading this even more aggressively. As an example he is taking his wheelchair into dialysis on most days 3/4; right third metatarsal head. Again he has eschar over the surface of the wound which I removed with a #3 curette. Some subcutaneous debris. This has 0.8 cm of direct probing depth. I cannot feel any bone here. I did do a culture the area 3/11; right third metatarsal head. Again an eschared area over the wound which almost makes this look superficial. Again debridement reveals a wound with probing depth probably not much different from last week there is no palpable bone no palpable purulence. The patient tells [name] that he is making every effort to offload this area properly. He is taking wheelchair into dialysis etc. There is no option for forefoot offloading or a total contact cast. We used Oasis #1 today 3/18; again callus and eschar over the wound surface. I remove this but still a probing wound although only 3 mm in depth this time. This is an improvement 3/25; again callus and eschar over the wound surface which I removed the wound is much deeper today at 0.7 cm. There is no  palpable bone. Because of the appearance of this a culture was done. I am not going to put the Oasis back in this. Concerned about underlying infection. Notable for the fact that he is just finished doxycycline for the last go round of this 3/30; still 0.7 cm in depth which is disappointing. Culture I gave last time showed a few Staph aureus which is methicillin susceptible. This should have been taken care of by the recent course of doxycycline I gave him. We are using silver alginate 4/6; almost 1 cm in depth. We have been using silver alginate with underlying Bactroban. 4/15; about 1 cm in depth still. There is no palpable bone but there is undermining thick surface again as usual. Our intake nurse noted what looked to be purulent drainage I suppose that could have been Bactroban but I did a culture for CandS 4/20; depth down 0.7 cm. Culture from last time was negative we have been using Bactroban and silver alginate 4/29; depth down 0.5 cm. Using silver alginate. He reports some drainage 5/6; comes in with the wound worse. Wound is deeper and  tunnels. Measures 0.9 depth and an undermining area of 1.8 cm. Electronic Signature(s) Signed: 05/01/2020 5:30:42 PM By: Linton Ham MD Entered By: Linton Ham on 05/01/2020 14:35:17 -------------------------------------------------------------------------------- Physical Exam Details Patient Name: Date of Service: ELLERY, MERONEY 05/01/2020 1:45 PM Medical Record Number: [id number] Patient Account Number: [id number] Date of Birth/Sex: Treating RN: [birth year] (69 y.o. [name] Primary Care Provider: [name] Other Clinician: Referring Provider: Treating Provider/Extender: Tanna Savoy in Treatment: 46 Constitutional Sitting or standing Blood Pressure is within target range for patient.. Pulse regular and within target range for patient.[name] Respirations regular, non-labored and within target range..  Temperature is normal and within the target range for the patient.[name] Appears in no distress. Notes Wound exam small punched-out hole over the right second metatarsal head. The depth of this is up to 1.9. At 12:00 there is 1.8 cm of tunneling. There is no palpable bone but this is clearly a lot worse. There is no drainage no purulence. Electronic Signature(s) Signed: 05/01/2020 5:30:42 PM By: Linton Ham MD Entered By: Linton Ham on 05/01/2020 14:36:14 -------------------------------------------------------------------------------- Physician Orders Details Patient Name: Date of Service: [name], GENTHER 05/01/2020 1:45 PM Medical Record Number: [id number] Patient Account Number: [id number] Date of Birth/Sex: Treating RN: Aug 12, 1951 (69 y.o. 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 Dressing Change Frequency Wound #41 Right Metatarsal head second Do not change entire dressing for one week. Skin Barriers/Peri-Wound Care Moisturizing lotion - right leg. Wound Cleansing May shower with protection. - patient to use a cast protector. Primary Wound Dressing Wound #41 Right Metatarsal head second Cutimed Sorbact - moisten with hydrogel. pack into tunnel and undermining. Secondary Dressing Wound #41 Right Metatarsal head second Dry Gauze Other: - felt to offload periwound. Edema Control Kerlix and Coban - Right Lower Extremity Avoid standing for long periods of time Elevate  legs to the level of the heart or above for 30 minutes daily and/or when sitting, a frequency of: - throughout the day. Additional Orders / Instructions Other: - minimize walking and standing on foot to aid in wound healing and callus buildup. Electronic Signature(s) Signed: 05/01/2020 5:30:42 PM By: Linton Ham MD Signed: 05/01/2020 5:34:26 PM By: Deon Pilling Entered By: Deon Pilling on 05/01/2020 14:30:57 -------------------------------------------------------------------------------- Problem List Details Patient Name: Date of Service: [name], [name] 05/01/2020 1:45 PM Medical Record Number: [id number] Patient Account Number: [id number] Date of Birth/Sex: Treating RN: 1951/02/27 (69 y.o. 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, other extremity 06/12/2019 No Yes L03.115 Cellulitis of right lower limb 11/15/2019 No Yes Inactive Problems ICD-10 Code Description Active Date Inactive Date L03.114 Cellulitis of left upper limb 06/12/2019 06/12/2019 L98.498 Non-pressure chronic ulcer of skin of other sites with other specified  severity 06/12/2019 06/12/2019 S61.209D Unspecified open wound of unspecified finger without damage to nail, subsequent 06/12/2019 06/12/2019 encounter Resolved Problems Electronic Signature(s) Signed: 05/01/2020 5:30:42 PM By: Linton Ham MD Entered By: Linton Ham on 05/01/2020 14:33:20 -------------------------------------------------------------------------------- Progress Note Details Patient Name: Date of  Service: [name]. 05/01/2020 1:45 PM Medical Record Number: [id number] Patient Account Number: [id number] Date of Birth/Sex: Treating RN: [birth year] (69 y.o. [name] Primary Care Provider: [name] Other Clinician: Referring Provider: Treating Provider/Extender: Tanna Savoy in Treatment: 65 Subjective History of Present Illness (HPI) The following HPI elements were documented for the patient's wound: Location: Patient presents with a wound to bilateral feet. Quality: Patient reports experiencing essentially no pain. Severity: Mildly severe wound with no evidence of infection Duration: Patient has had the wound for greater than 2 weeks prior to presenting for treatment The patient is a pleasant 69 yrs old bm here for evaluation of ulcers on the plantar aspect of both feet. 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 He has a left arm graft for access. He has been trying to stay off his feet for weeks but does not seem to have any improvement in the wound. He has been seeing someone at the foot center and was referred to the Wound Care center for further evaluation. 12/30/15 the patient has 3 wounds one over the right first metatarsal head and 2 on the left foot at the left fifth and lleft first metatarsal head. All of these look relatively similar. The one over the left fifth probe to bone I could not prove that any of the others did. I note his MRI in November that did not show osteomyelitis. His peripheral pulses seem robust. All of these underwent surgical debridement to remove callus nonviable skin and subcutaneous tissue 01/06/16; the patient had his sutures removed from the right fifth ray amputation. There may be a small open part of this superiorly but otherwise the incision looks good. Areas over his right first, left first and left fifth metatarsal head all underwent surgical debridement as  a varying degree of callus, skin and nonviable subcutaneous tissue. The area that is most worrisome is the right fifth metatarsal head which has a wound probes precariously close to bone. There is no purulent drainage or erythema 01/13/16; I'm not exactly sure of the status of the right fifth ray amputation site however he follows with Dr. [name] later this week. The area over his right first plantar metatarsal head, left first and fifth plantar metatarsal head are all in the same status. Thick circumferential callus, nonviable subcutaneous tissue. Culture of the left fifth did not culture last week 01/20/16; all of the patient's wounds appear and roughly the same state although his amputation site on the right lateral foot looks better. His wounds over the right first, left first and left fifth metatarsal heads all underwent difficult surgical debridement removing circumferential callus nonviable skin and subcutaneous tissue. There is no overt evidence of infection in these areas. MRI at the end of December of the left foot did not show osteomyelitis, right foot showed osteomyelitis of the right fifth digit he is now status post amputation. 01/27/16 the patient's wounds over his plantar first and fifth metatarsal heads on the left all appear better having been started on a total contact cast last week. They were debridement of circumferential callus and nonviable subcutaneous tissue as was the wound over the first metatarsal  head. His surgical incision on the right also had some light surface debridement done. 03/23/2016 -- the patient was doing really well and most of his wounds had almost completely healed but now he came back today with a history of having a discharge from the area of his right foot on the plantar aspect and also between his first and second toe. He also has had some discharge from the left foot. Addendum: I spoke to the PA [name] at the dialysis center whose fax number  is [phone]. We discussed the infection the patient has and she will put the patient on vancomycin and Fortaz until the final culture report is back. We will fax this as soon as available. 03/30/2016 -- left foot x-ray IMPRESSION:No definitive osteomyelitis noted. X-ray of the right foot -- IMPRESSION: 1. Soft tissue swelling. Prior amputation right fifth digit. No acute or focal bony abnormality identified. If osteomyelitis remains a clinical concern, MRI can be obtained. 2. Peripheral vascular disease. His culture reports have grown an MSSA -- and we will fax this report to his hemodialysis center. He was called in a prescription of oral doxycycline but I have told her not to fill this in as he is already on IV antibiotics. 04/06/2016 -- a few days ago, I spoke to the hemodialysis center nurse who had stopped the IV antibiotics and he was given a prescription for doxycycline 100 mg by mouth twice a day for a week and he is on this at the present time. 05/11/2016 -- he has recently seen his PCP this week and his hemoglobin A1c was 7. He is working on his paperwork to get his orthotic shoes. 05/25/2016 -- -- x-ray of the left foot IMPRESSION:No acute bony abnormality. No radiographic changes of acute osteomyelitis. No change since prior study. X-ray of the right foot -- IMPRESSION: Postsurgical changes are seen involving the fifth toe. No evidence of acute osteomyelitis. he developed a large blister on the medial part of his right foot and this opened out and drain fluid. 06/01/2016 -- he has his MRI to be done this afternoon. 06/15/2016 -- MRI of the left forefoot without contrast shows 2 separate regions of cutaneous and subcutaneous edema and possible ulceration and blistering along the ball of the foot. No obvious osteomyelitis identified. MRI of the right foot showed cutaneous and subcutaneous thickening plantar to the first digit sesamoid with an ulcer crater but no underlying  osteomyelitis is identified. 06/22/2016 -- the right foot plantar ulcer has been draining a lot of seropurulent material for the last few days. 06/30/2016 -- spoke to the dialysis center and I believe I spoke to [place] a PA at the center who discussed with [name] and agreed to putting Legrand Como on vancomycin until his cultures arrive. On review of his culture report no WBCs were seen or no organisms were seen and the culture was reincubated for better growth. The final report is back and there were no predominant growth including Streptococcus or Staphylococcus. Clinically though he has a lot of drainage from both wounds a lot of undermining and there is further blebs on the left foot towards the interspace between his first and second toe. 08/10/2016 -- the culture from the right foot showed normal skin flora and there was no Staphylococcus aureus was group A streptococcus isolated. 09/21/2016 -- -- MRI of the right foot was done on 09/13/2016 - IMPRESSION: Findings most consistent with acute osteomyelitis throughout the great toe, sesamoid bones and plantar aspect of the  head of the first metatarsal. Fluid in the sheath of the flexor tendon of the great toe could be sympathetic but is worrisome for septic tenosynovitis. First MTP joint effusion worrisome for septic joint. He was admitted to the hospital on 09/11/2016 and treated for a fever with vancomycin and cefepime. He was seen by Dr. Bobby Rumpf of infectious disease who recommended 6 weeks treatment with vancomycin and ceftazidime with his hemodialysis and to continue to see as in the wound clinic. Vascular consult was pending. The patient was discharged home on 09/14/2016 and he would continue with IV antibiotics for 6 weeks. 09/28/16 wound appears reasonably healthy. Continuing with total contact cast 10/05/16 wound is smaller and looking healthy. Continue with total contact cast. He continues on IV antibiotics 10/12/2016 -- he has  developed a new wound on the dorsal aspect of his left big toe and this is a superficial injury with no surrounding cellulitis. He has completed 30 days of IV antibiotics and is now ready to start his hyperbaric oxygen therapy as per his insurance company's recommendation. 10/19/2016 -- after the cast was removed on the right side he has got good resolution of his ulceration on the right plantar foot. he has a new wound on the left plantar foot in the region of his fourth metatarsal and this will need sharp debridement. 10/26/2016 -- Xray of the right foot complete: IMPRESSION: Changes consistent with osteomyelitis involving the head of the first metatarsal and base of the first proximal phalanx. The sesamoid bones are also likely involved given their positioning. 11/02/2016 -- there still awaiting insurance clearance for his hyperbaric oxygen therapy and hopefully he will begin treatment soon. 11/09/2016 -- started with hyperbaric oxygen therapy and had some barotrauma to the right ear and this was seen by ENT who was prescribed Afrin drops and would probably continue with HBO and he is scheduled for myringotomy tubes on Friday 11/16/2016 -- he had his myringotomy tubes placed on Friday and has been doing much better after that with some fluid draining out after hyperbaric treatment today. The pain was much better. 11/30/2016 -- over the last 2 days he noticed a swelling and change of color of his right second toe and this had been draining minimal fluid. 12/07/2016 -- x-ray of the right foot -- IMPRESSION:1. Progressive ulceration at the distal aspect of the second digit with significant soft tissue swelling and osseous changes in the distal phalanx compatible with osteomyelitis. 2. Chronic osteomyelitis at the first MTP joint. 3. Ulcerations at the second and third toes as well without definite osseous changes. Osteomyelitis is not excluded. 4. Fifth digit amputation. On 12/01/2016 oo I spoke to  Dr. Bobby Rumpf, the infectious disease specialist, who kindly agreed to treat this with IV antibiotics and he would call in the order to the dialysis center and this has been discussed in detail with the patient who will make the appropriate arrangements. The patient will also book an appointment as soon as possible to see Dr. [name] in the office. Of note the patient has not been on antibiotics this entire week as the dialysis center did not receive any orders from Dr. [name]. I got in touch with Dr. [name] who tells [name] the patient has an appointment to see him this coming Wednesday. 12/14/2016 -- the patient is on ceftazidime and vancomycin during his dialysis, and I understand this was put on by his nephrologist Dr. Raliegh Ip possibly after speaking with infectious disease Dr. [name] 12/23/16; patient was on my  schedule today for a wound evaluation as he had difficulties with his schedule earlier this week. I note that he is on vancomycin and ceftazidine at dialysis. In spite of this he arrives today with a new wound on the base of the right second toe this easily probes to bone. The known wound at the tip of the second toe With this area. He also has a superficial area on the medial aspect of the third toe on the side of the DIP. This does not appear to have much depth. The area on the plantar left foot is a deep area but did not probe the bone 12/28/16 -- he has brought in some lab work and the most recent labs done showed a hemoglobin of 12.1 hematocrit of 36.3, neutrophils of 51% WBC count of 5.6, BN of 53, albumin of 4.1 globulin of 3.7, vancomycin 13 g per mL. 01/04/2017 -- he saw Dr. [name] who has recommended a amputation of the right second toe and he is awaiting this date. He sees Dr. Bobby Rumpf of infectious disease tomorrow. The bone culture taken on 12/28/2016 had no growth in 2 days. 01/11/2017 -- was seen by Dr. Bobby Rumpf regarding the management and has recommended a  eval by vascular surgeons. He recommended to continue the antibiotics during his hemodialysis. Xray of the left foot -- IMPRESSION: No acute fracture, dislocation, or osseous erosion identified. 01/18/2017 -- he has his vascular workup later today and he is going to have his right foot second toe amputation this coming Friday by Dr. [name]. We have also put in for a another 30 treatments with hyperbaric oxygen therapy 01/25/2017 -- I have reviewed Dr. Donnetta Hutching is vascular report from last week where he reviewed him and thought his vascular function was good enough to heal his amputation site and no further tests were recommended. He also had his orthopedic related to surgery which is still pending the notes and we will review these next week. 02/01/2017 -- the operative remote of Dr. 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 02/08/2017 --he was seen by Dr. [name] on 02/03/2017 -- patient is on [place]. after a thorough review he had recommended to continue with antibiotics during hemodialysis. The patient was seen by Dr. [name], but I do not find any follow-up note on the electronic medical record. he has removed the dressing over the right foot to remove the sutures and asked him to see him back in a month's time 02/22/2017 -- patient has been febrile and has been having symptoms of the upper respiratory tract infection but has not been checked for the flu and it's been over 4 days now. He says he is feeling better today. Some drainage between his left first and second toe and this needed to be looked at 03/08/2017 -- was seen by Dr.  Bobby Rumpf on 03/07/2017 after review he will stop his antibiotics and see him back in a month to see if he is feeling well. 03/15/2017 -- he has completed his course of hyperbaric oxygen therapy and is doing fine with his health otherwise. 03/22/2017 -- his nutritionist at the dialysis center has recommended a protein supplement to help  build his collagen and we will prescribe this for him when he has the details 05/03/2017 -- he recently noticed the area on the plantar aspect of his fifth metatarsal head which opened out and had minimal drainage. 05/10/2017 -- -- x-ray of the left foot -- IMPRESSION:1. No convincing conventional radiographic evidence of active osteomyelitis.2. Active soft tissue ulceration at the tip of the fifth digit, and at the lateral aspect of the foot adjacent the base of the fifth metatarsal. 3. Surgical changes of prior fourth toe amputation. 4. 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 5. Small vessel atherosclerotic vascular calcifications. 6. 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 ======= Readmission after 5 weeks: 06/15/2017 -- the patient returns after 5 weeks having had an MRI done on 05/17/2017 MRI of the left foot without contrast showed soft tissue ulcer overlying the base of the fifth metatarsal. 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 No drainable fluid collection to suggest an abscess. He was in hospital between 05/16/2017 and 05/25/2017 -- and on discharge was asked to follow-up with Dr. [name] and Dr. [name]. He was treated 2 weeks post discharge with vancomycin plus ceftriaxone with hemodialysis and oral metronidazole 500 mg 3 times a day. 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 last hemoglobin A1c was 6.7% He had a postoperative MR of the  foot on 05/23/2017 -- which showed no new areas of cortical bone loss, edema or soft tissue ulceration to suggest osteomyelitis. the patient has completed his course of IV antibiotics during dialysis and is to see the infectious disease doctor tomorrow. Dr. [name] had seen him after suture removal and asked him to keep the wound with a dry dressing 06/21/2017 -- he was seen by Dr. Bobby Rumpf of infectious disease on 06/16/2017 -- he stopped his Flagyl and will see him back in 6 weeks. He was asked to follow-up with Dr. [name] and with the wound clinic clinic. 07/05/17; bone biopsy from last time showed acute osteomyelitis. This is from the reminiscent in the left fifth metatarsal. Culture result is apparently still pending [holding for anaerobe}. From my understanding in this case this is been a progressive necrotic wound which is deteriorated markedly over the last 3 weeks since he returned here. He now has a large area of exposed bone which was biopsied and cultured last week. Dr. Graylon Good has put him on vancomycin and Fortaz during his hemodialysis and Flagyl orally. He is to see Dr. [name] next week 07/19/2017 -- the patient was reviewed by Dr. [name] of orthopedics who reviewed the case in detail and agreed with the plan to continue with IV antibiotics, aggressive wound care and hyperbaric oxygen therapy. He would see him back in 3 weeks' time 08/09/2017 -- saw Dr. Bobby Rumpf on 08/03/2017 -he was restarted on his antibiotics for 6 weeks which included vanco, ceftaz and Flagyl. he recommended continuing his antibiotics for 6 weeks and reevaluate his completion date. He would continue with wound care and hyperbaric oxygen therapy. 08/16/2017 -- I understand he will be completing his 6 weeks of antibiotics sometime later this week. 09/19/2017 -- he has been without his wound VAC for the last week due to lack of supplies. He has been packing his wound with silver alginate. 10/04/2017 -- he  has an appointment with Dr. [name] tomorrow and hence we  will not apply the wound VAC after his dressing changes today. 10/11/2017 -- he was seen by Dr. Bobby Rumpf on 10/05/2017, noted that the patient was currently off antibiotics, and after thorough review he recommended he follow-up with the wound care as per plan and no further antibiotics at this point. 11/29/17; patient is arrived for the wound on his left lateral foot.[name] He is completed IV antibiotics and 2 rounds of hyperbaric oxygen for treatment of underlying osteomyelitis. He arrives today with a surface on most of the wound area however our intake nurse noted drainage from the superior aspect. This was brought to my attention. 12/13/2017 -- the right plantar foot had a large bleb and once the bleb was opened out a large callus and subcutaneous debris was removed and he has a plantar ulcer near the fourth metatarsal head 12/28/17 on evaluation today patient appears to be doing acutely worse in regard to his left foot. The wound which has been appearing to do better as now open up more deeply there is bone palpable at the base of the wound unfortunately. He tells [name] that this feels "like it did when he had osteomyelitis previously" he also noted that his second toe on the left foot appears to be doing worse and is swollen there does appear to be some fluid collected underneath. His right foot plantar ulcer appears to be doing somewhat better at this point and there really is no complication at the site currently. No fevers, chills, nausea, or vomiting noted at this time. Patient states that he normally has no pain at this site however. T oday he is having significant pain. 01/03/18; culture done last week showed methicillin sensitive staph aureus and group B strep. He was on [place] however he arrives today with a fever of 101. He has a functional dialysis shunt in his left arm but has had no pain here. No cough. He still makes some urine no  dysuria he did have abdominal pain nausea and diarrhea over the weekend but is not had any diarrhea since yesterday. Otherwise he has no specific complaints 01/05/18; the patient returns today in follow-up for his presentation from 1/8. At that point he was febrile. I gave him some Levaquin adjusted for his dialysis status. He tells [name] the fever broke that night and it is not likely that this was actually a wound infection. He is gone on to have an MRI of the left foot; this showed interval development of an abnormal signal from the base of the third and fourth metatarsal and in the cuboid reminiscent consistent with osteomyelitis. There is no mention of the left second toe I think which was a concern when it was ordered. The patient is taking his Levaquin 01/10/18; the patient has completed his antibiotics today. The area on the left lateral foot is smaller but it still probes easily to bone. He has underlying osteomyelitis here and sees Dr. Megan Salon of infectious disease this week ooThe area on the right third plantar metatarsal head still requires debridement not much change in dimensions [facility] has a new wound on the medial tip of his right third toe again this probes to bone. He only noticed this 2 days ago 01/17/18; the patient saw Dr. [name] last week and he is back on Vanco and Fortaz at dialysis. 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 We are  previously seeing him for a wound also on the base of the fourth med head on the right. X-ray of this area did not show osteomyelitis in relation to a new wound on the tip of his right third toe. Again this probes to bone. Finally his second toe on the left which didn't really show anything on the MRI at least no report appears to have a separated cutaneous area which I think is going to come right off the tip of his toe and leave exposed  bone. Whether this is infectious or ischemic I am not clear Culture I did from the third toe last week which was his new wound was negative. Plain x-ray on the right foot did not show osteomyelitis in the toes 01/24/18; the patient is going went to see Dr. [name]. He is going to have a amputation of the left second and right third toe although paradoxically the left second toe looks better than last week. We have treating a deep probing wound on the left lateral foot and the area over the third metatarsal head on the right foot. 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 Also a debridement including bone of the left lateral foot and a closure which is still sutured. This is a bit surprising. Also apparent debridement of the base of the right third toe wound. 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 I'm not sure exactly where we are in the frame of things here. The wound on the left lateral foot is still sutured also the amputation sites of the left second and right third toe. He is been using Xeroform to the sutured areas silver alginate to the right foot 02/14/18; he continues on Rocephin at dialysis for osteomyelitis. I still don't have a good sense of where we are in the treatment duration. The wounds on the left lateral foot had the sutures removed and this clearly still probes deeply. We debrided the area with a #5 curet. We'll use silver alginate to the wound oothe area over the right third met head also required debridement of callus skin and subcutaneous tissue and necrotic debris over the wound surface. This tunnels superiorly but I did not unroofed this today. ooThe areas on the tip of his toes have a crusted surface eschar I did not debridement this  either. 02/21/18; he continues on Rocephin at dialysis for osteomyelitis. I don't have a good sense of where we are and the treatment duration. The wounds on the left lateral foot is callused over on presentation but requires debridement. The area on the right third med head plantar aspect actually is measuring smaller 02/28/18;patient is on Vanco and Fortaz at dialysis, I'm not sure why I thought this was Rocephin above unless it is been changed. I don't have a good sense of time frame here. Using silver collagen to the area on the left lateral foot and right third med head plantar foot 03/07/18; patient is completing bank and Fortaz at dialysis soon. He is using Silver collagen to the area on the left lateral foot and the right third metatarsal head. He has a smaller superficial wound distal to the left lateral foot wound. 03/14/18-he is here in follow-up evaluation for multiple ulcerations to his bilateral feet. He presents with  a new ulceration to the plantar aspect of the left foot underneath the blister, this was deroofed to reveal a partial thickness ulcer. He is voicing no complaints or concerns, tolerated dialysis yesterday. We will continue with same treatment plan and he will follow-up next week 03/21/18; the patient has a area on the lateral left foot which still has a small probing area. The overall surface area of the wound is better. He presented with a new ulceration on the left foot plantar fifth metatarsal head last week. The area on the right third metatarsal head appears smaller.using silver alginate to all wound areas. The patient is changing the dressing himself. He is using a Darco forefoot offloading are on the right and a healing sandal on the left 03/28/18; original wound left lateral foot. Still nowhere close to looking like a heeling surface oosecondary wound on the left lateral foot at the base of his question fifth metatarsal which was new last week ooRight third metatarsal  head. ooWe have been using silver alginate all wounds 04/04/18; the original wound on the left lateral foot again heavy callus surrounding thick nonviable subcutaneous tissue all requiring debridement. The new wound from 2 weeks ago just near this at the base of the fifth metatarsal head still looks about the same ooUnfortunately there is been deterioration in the third medical head wound on the right which now probes to bone. I must say this was a superficial wound at one point in time and I really don't have a good frame of reference year. I'll have to go back to look through records about what we know about the right foot. 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 He is also been to see Dr. [name]. Somewhere in here as somebody is ordered a VASCULAR evaluation which was done on 03/22/18. On the right is anterior tibial artery was monophasic triphasic at the posterior tibial artery. On the left anterior tibial artery monophasic posterior tibial artery monophasic. ABI in the right was 1.43 on the left 1.21. TBIs on the right at 0.41 and on the left at 0.32. A vascular consult was recommended and I think has been arranged. 04/11/18; Mr. Slauson has not had an MRI of the right foot since 2017. Recent x-ray of the right foot done in January and February was negative. However he has had a major deterioration in the wound over the third met head. He is completed antibiotics last week at dialysis [place]. I think he is going to see vascular in follow-up. The areas on the left lateral foot and left plantar fifth metatarsal head both look satisfactory. I debrided both of these areas although the tissue here looks good. The area on the left lateral foot once probe to bone it certainly does not do that now 04/18/18; MRI of the right foot is on Thursday. He has what looks to be serosanguineous purulent  drainage coming out of the wound over the right third metatarsal head today. He completed antibiotics bank and Fortaz 2 weeks ago at dialysis. He has a vascular follow-up with regards to his arterial insufficiency although I don't exactly see when that is booked. The areas on the left foot including the lateral left foot and the plantar left fifth metatarsal head look about the same. 04/25/18-He is here in follow-up evaluation for bilateral foot ulcers. MRI obtained was negative for osteomyelitis. Wound culture was negative. We will  continue with same treatment plan he'll follow-up next week 05/02/18; the right plantar foot wound over the third metatarsal head actually looks better than when I last saw this. His MRI was negative for osteomyelitis wound culture was negative. ooOn the left plantar foot both wounds on the plantar fifth metatarsal head and on the lateral foot both are covered in a very hard circumferential callus. 05/09/18; right plantar foot wound over the third metatarsal head stable from last week. ooLeft plantar foot wound over the fifth metatarsal head also stable but with callus around both wound areas ooThe area on the left lateral foot had thick callus over a surface and I had some thoughts about leaving this intact however it felt boggy. ooWe've been using silver alginate all wounds 05/16/18; since the patient was last here he was hospitalized from 5/15 through 5/19. 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 This grew MRSA. He was placed on vancomycin.MRI of the left foot suggested "progressive" osteomyelitis and bone destruction of the cuboid and the base of the fifth metatarsal. Stable erosive changes of the base of the second metatarsal. I'm wondering if they're aware that he had surgery and debridement in the area of the underlying bone previously by Dr. [name]. In any  case, He was also revascularized with an angioplasty and stenting of the left tibial peroneal trunk and angioplasty of the left posterior tibial artery. He is on vancomycin at dialysis. He has a 2 week follow-up with infectious disease. He has vascular surgery follow-up. He was started on Plavix. 05/23/18; the patient's wound on the lateral left foot at the level of the fifth metatarsal head and the plantar wound on the plantar fifth metatarsal head both look better. The area on the right third metatarsal head still has depth and undermining. We've been using silver alginate. He is on vancomycin. He has been revascularized on the left 05/30/18; he continues on vancomycin at dialysis. Revascularized on the left. The area on the left lateral foot is just about closed. Unfortunately the area over the plantar fifth metatarsal head undermining superiorly as does the area over the right third metatarsal head. Both of these significantly deteriorated from last week. He has appointments next week with infectious disease and orthopedics I delayed putting on a cast on the left until those appointments which therefore we made we'll bring him in on Friday the 14th with the idea of a cast on the left foot 06/06/18; he continues on vancomycin at dialysis. Revascularized on the left. He arrives today with a ballotable swelling just above the area on the left lateral foot where his previous wound was. He has no pain but he is reasonably insensate. The difficult areas on the plantar left fifth metatarsal head looks stable whereas the area on the right third plantar metatarsal head about the same as last week there is undermining here although I did not unroofed this today. He required a considerable debridement of the swelling on the left lateral foot area and I unroofed an abscess with a copious amount of brown purulent material which I obtained for culture. I don't believe during his recent hospitalization he had any  further imaging although I need to review this. Previous cultures from this area done in this clinic showed MRSA, I would be surprised if this is not what this is currently even though he is on vancomycin. 06/13/18; he  continues on vancomycin at dialysis however he finishes this on Sunday and then graduates the doxycycline previously prescribed by Dr. [name]. The abscess site on the lateral foot that I unroofed last time grew moderate amounts of methicillin-resistant staph aureus that is both vancomycin and tetracycline sensitive so we should be okay from that regard. He arrives today in clinic with a connection between the abscess site and the area on the lateral foot. We've been using silver alginate to all his wound areas. The right third metatarsal head wound is measuring smaller. Using silver alginate on both wound areas 06/20/18; transitioning to doxycycline prescribed by Dr. [name]. The abscess site on the lateral foot that I unroofed 2 weeks ago grew MRSA. That area has largely closed down although the lateral part of his wound on the fifth metatarsal head still probes to bone. I suspect all of this was connected. ooOn the right third metatarsal head smaller looking wound but surrounded by nonviable tissue that once again requires debridement using silver alginate on both areas 06/27/18; on doxycycline 100 twice a day. The abscess on the lateral foot has closed down. Although he still has the wound on the left fifth metatarsal head that extends towards the lateral part of the foot. The most lateral part of this wound probes to bone ooRight third metatarsal head still a deep probing wound with undermining. Nevertheless I elected not to debridement this this week ooIf everything is copious static next week on the left I'm going to attempt a total contact cast 07/04/18; he is tolerating his doxycycline. The abscess on the left lateral foot is closed down although there is still a deep wound here  that probes to bone. We will use silver collagen under the total contact cast Silver collagen to the deep wound over the right third metatarsal head is well 07/11/18; he is tolerating doxycycline. The left lateral fifth plantar metatarsal head still probes deeply but I could not probe any bone. We've been using silver collagen and this will be the second week under the total contact cast ooSilver collagen to deep wound over the right third metatarsal head as well 07/18/18; he is still taking doxycycline. The left lateral fifth plantar metatarsal this week probes deeply with a large amount of exposed bone. Quite a deterioration. He required extensive debridement. Specimens of bone for pathology and CNS obtained ooAlso considerable debridement on the right third metatarsal head 07/25/18; bone for pathology last week from the lateral left foot showed osteomyelitis. CandS of the bone Enterobacter. And Klebsiella. He is now on ciprofloxacin and the doxycycline is stopped [Dr. [name] of infectious disease] area He has an appointment with Dr. [name] tomorrow I'm going to leave the total contact cast off for this week. May wish to try to reapply that next week or the week after depending on the wound bed looks. I'm not sure if there is an operative option here, previously is followed with Dr. [name] 08/01/18 on evaluation today patient presents for reevaluation. He has been seen by infectious disease, Dr. [name], and he has placed the patient back on Ceftaz currently to be given to him at dialysis three times a week. 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 Fortunately there does not appear to be the evidence of infection at this point in time. Overall the patient has been tolerating the dressing changes without complication. We are no longer due lives in the cast of  left foot secondary to the infection obviously. 08/10/18 on evaluation  today patient appears to be doing rather well all things considered in regard to his left plantar foot ulcer. He does have a significant callous on the lateral portion of his left foot he wonders if I can help clean that away to some degree today. Fortunately he is having no evidence of infection at this time which is good news. No fevers chills noted. With that being said his right plantar foot actually does have a significant callous buildup around the wound opening this seems to not be doing as well as I would like at this point. I do believe that he would benefit from sharp debridement at this site. Subsequently I think a total contact cast would be helpful for the right foot as well. 08/17/18; the patient arrived today with a total contact cast on the right foot. This actually looks quite good. He had gone to see Dr. Megan Salon of infectious disease about the osteomyelitis on the left foot. I think he is on IV Fortaz at dialysis although I am not exactly sure of the rationale for the Fortaz at the time of this dictation. He also arrived in today with a swelling on the lateral left foot this is the site of his original wounds in fact when I first saw this man when he was under Dr. Ardeen Garland care I think this was the site of where the wound was located. It was not particularly tender however using a small scalpel I opened it to [place] some moderate amount of purulent drainage. 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 Last MRI was in May that showed progressive osteomyelitis and bone destruction of the cuboid and base of the fifth metatarsal. Last week he had an abscess over this same area this was removed. Culture was negative. ooWe are continuing with a total contact cast to the area on the third metatarsal head on the right and making some good progress here. The patient  asked [name] again about renal transplant. He is not on the list because of the open wounds. 08/31/18; apparently the patient had an interruption in the [place] but he is now back on this at dialysis. Apparently there was a misunderstanding and Dr. Crissie Figures orders. Due to have the MRI of the left foot tonight. ooThe area on the right foot continues to have callus thick skin and subcutaneous tissue around the wound edge that requires constant debridement however the wound is smaller we are using a total contact cast in this area. Alginate all wounds 09/07/18; without much surprise the MRI of his left foot showed osteomyelitis in the reminiscent of his fourth metatarsal but also the third metatarsal. She arrives in clinic today with the right foot and a total contact cast. There was purulent drainage coming out of the wound which I have cultured. Marked deterioration here with undermining widely around the wound orifice. Using pickups and a scalpel I remove callus and subcutaneous tissue from a substantial new opening. ooIn a similar fashion the area on the left lateral foot that was blistered last week I have opened this and remove skin and subcutaneous tissue from this area to expose a obvious new wound. I think there is extension and communication between all of this on the left foot. ooThe patient is on Fortaz at dialysis. Culture done of the right  foot. We are clearly not making progress here. ooWe have made him an appointment with Dr. [name] of orthopedics. I think an amputation of the left leg may be discussed. I don't think there is anything that can be done with foot salvage. 09/14/18; culture from the right foot last time showed a very resistant MRSA. This is resistant to doxycycline. I'm going to try to get linezolid at least for a week. He does not have a appointment with Dr. [name] yet. 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 I'll  send a message to Dr. [name] of infectious disease. I want the patient to see Dr. [name] to go over the pros and cons of an amputation which I think will be a BKA. The patient had a question about whether this is curative or not. I told him that I thought it would be although spread of staph aureus infection is not unheard of. MRI of the right foot was done at the end of April 2019 did not show osteomyelitis. The patient last saw Dr. [name] on 9/3. I'll send Dr. [name] a message. I would really like him to weigh the pros and cons of a BKA on the left otherwise he'll probably need IV antibiotics and perhaps hyperbaric oxygen again. He has not had a good response to this in the past. His MRI earlier this month showed progressive damage in the remnants of the fourth and third metatarsal 09/21/2018; sees Dr. [name] of orthopedics next week to discuss a left BKA in response to the osteomyelitis in the left foot. Using silver alginate to all wound areas. He is completing the Zyvox I did put in for him after last culture showed MRSA 09/29/2018; patient saw Dr. [name] of orthopedics discussed the osteomyelitis in the left foot third and fourth metatarsals. He did not recommend urgent surgery but certainly stated the only surgical option would be a BKA. He is communicating with Dr. [name]. The problem here is the instability of the areas on his foot which constantly generate draining abscesses. I will communicate with Dr. [name] about this. He has an appointment on 10/23 10/05/2018; patient sees Dr. [name] on 10/23. I have sent him a secure message not ordered any additional antibiotics for now. Patient continues to have 2 open areas on the left plantar foot at the fifth metatarsal head and the lateral aspect of the foot. These have not changed all that much. The area on the right third met head still has thick callus and surrounding subcutaneous tissue we have been using silver alginate 10/12/2018; patient  sees Dr. [name] or colleague on 10/23. 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 The area on the right plantar foot is just about closed. The superior wound on the left fifth metatarsal head is callused over but I am not sure if this is closed. The area below it is about the same. Considerable amount of callus on the lateral foot. We have been using silver alginate to the wounds 10/19/2018; the patient saw Dr. [name] on 10/22. He wishes to try and continue to save the left foot. He has been given 6 weeks of oral ciprofloxacin. We continue to put a total contact cast on the right foot. The area over the fifth metatarsal head on the left is callused over/perhaps healed but I did not remove the callus to find out. He still has  the wound on the left lateral midfoot requiring debridement. We have been using silver alginate to all wound areas 10/26/2018 ooOn the left foot our intake nurse noted some purulent drainage from the inferior wound [currently the only one that is not callused over] ooOn the right foot even though he is in total contact cast a considerable amount of thick black callus and surface eschar. On this side we have been using silver alginate under a total contact cast. oohe remains on ciprofloxacin as prescribed by Dr. [name] 11/02/2018; culture from last week which is done of the probing area on the left midfoot wound showed MRSA "few". I am going to need to contact Dr. [name] which I will do today I think he is going to need IV antibiotics again. The area on the left foot which was callused on the side is clearly separating today all of this was removed a copious amounts of callus and necrotic subcutaneous tissue. ooThe area on the right plantar foot actually remained healthy looking with a healthy granulated base. ooWe are using silver alginate to all wound areas 11/09/2018; unfortunately neither 1 of the  patient's wounds areas looks at all satisfactory. On the left he has considerable necrotic debris from the plantar wound laterally over the foot. I removed copious amounts of material including callus skin and subcutaneous tissue. On the right foot he arrives with undermining and frankly purulent drainage. Specimen obtained for culture and debridement of the callused skin and subcutaneous tissue from around the circumference. Because of this I cannot put him back in a total contact cast but to be truthful we are really unfortunately not making a lot of progress I did put in a secure text message to Dr. [name] wondering about the MRSA on the left foot. He suggested vancomycin although this is not started. I was left wondering if he expected [name] to send this into dialysis. Has we have more purulence on the right side wait for that result before calling dialysis 11/16/2018; I called dialysis earlier this week to get the vancomycin started. I think he got the first dose on Tuesday. The patient tells [name] that he fell earlier this week twisting his left foot and ankle and he is swelling. He continues to not look well. He had blood cultures done at dialysis on Tuesday he is not been informed of the results. 12/07/2018; the patient was admitted to hospital from 11/22/2018 through 12/02/2018. He underwent a left BKA. Apparently had staph sepsis. In the meantime being off the right foot this is closed over. He follows up with Dr. 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 Subsequent to this he actually had multiple falls in fact he tells [name] to following which calls the wound to be his apparently. He has been seeing Dr. [name] and his physician assistant in the interim. They actually did want him to come back for reevaluation to see if there's anything we can do for [name] wound care perspective the help  this area to heal more appropriately. The patient states that he does not have a tremendous amount of pain which is good news. No fevers, chills, nausea, or vomiting noted at this time. We have gotten approval from Dr. [name] office had [place] for [place] to treat the patient for his stop wound. This is due to the fact that the patient was in the 90 day postop global. 1/21;  the patient does not have an open wound on the right foot although he does have some pressure areas that will need to be padded when he is transferring. The dehisced surgical wound looks clean although there is some undermining of 1.5 cm superiorly. 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 This appears healthy but there is undermining. TheraSkin #1 2/11; TheraSkin #2 2/25; TheraSkin #3. Wound is measuring smaller 3/10; TheraSkin #4 wound is measuring smaller 3/24; TheraSkin #5 wound is measuring smaller and looks healthy. 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 He has no open area on the right foot although he does have some callused area over the original third metatarsal head wound. The third metatarsal is also subluxed on the right foot. I have warned him today that he cannot consider wearing a prosthesis for at least a month but he can go for measurements. He is going to need to keep the right foot padded is much as possible in his diabetic shoe indefinitely READMISSION 06/12/2019 Mr. Cancelliere is a type II diabetic on dialysis. He has been in this clinic multiple times with wounds on his bilateral lower extremities. 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 He also has a history of wounds on his right foot but tells [name] that everything is going well here. He actually came in here with his prosthesis walking with a  cane. We were all really quite gratified to see this He states over the last several weeks he has had a painful area at the tip of the left third finger. His dialysis shunt in his is in the left upper arm and this particularly hurts during dialysis when his fingers get numb and there is a lot of pain in the tip of his left third finger. I believe he is seen vascular surgery. He had a Doppler done to evaluate for a dialysis steal syndrome. He is going for a banding procedure 1 week from today towards the left AV fistula. I think if that is unsuccessful they will be looking at creating a new shunt. He had a course of doxycycline when he which he finished about a week ago. He has been using Bactroban to the finger 6/25; the patient had his banding procedure and things seem to be going better. He is not having pain in his hand at dialysis. The area on the tip of his finger seems about closed. He did however have a paronychia I the last time I saw him. I have been advising him to use topical Bactroban washing the finger. Culture I did last time showed a few staph epidermidis which I was willing to dismiss as superficial skin contaminant. He arrives today with the finger wound looking better however the paronychia a seems worse 7/9; 2-week follow-up. He does not have an open wound on the tip of his third left finger. I thought he had an initial ischemic wound on the tip of the finger as well as a paronychia a medially. All of this appears to be closed. 8/6-Patient returns after 3 weeks for follow-up and has a right second met head plantar callus with a significant area of maceration hiding an ulcer ABI repeated today on right - 1.26 8/13-Patient returns at 1 week, the right second met  head plantar wound appears to be slightly better, it is surrounded by the callus area we are using silver alginate 8/20; the patient came back to clinic 2 weeks ago with a wound on the right second metatarsal head. He  apparently told [name] he developed callus in this area but also went away from his custom shoes to a pair of running shoes. Using silver alginate. 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 There might be much we can do to offload this although the patient tells [name] he has a wheelchair that he is using to try and stay off the wound is much as possible 8/27; right second metatarsal head. Still small punched out area with thick callus and subcutaneous tissue around the wound. We have been using silver collagen. This is always been an issue with this man's wound especially on this foot 9/3; right second met head. Still the same punched-out area with thick callus and subcutaneous tissue around the wound removing the circumference demonstrates repetitively undermining area. I have removed all the subcutaneous tissue associated with this. We have been using silver collagen 9/15; right second metatarsal head. Same punched-out thick callus and subcutaneous tissue around the wound area. Still requiring debridement we have been using silver collagen I changed back to silver alginate X-ray last time showed no evidence of osteomyelitis. Culture showed Klebsiella and he was started and Keflex apparently just 4 days ago 9/24; right second metatarsal head. He is completed the antibiotics I gave him for 10 days. Same punched-out thick callus and subcutaneous tissue around the area. Still requiring debridement. We have been using silver alginate. The area itself looks swollen and this could be just [place] that comes on this area with walking on a prosthesis on the left however I think he needs an MRI and I am going to order that today. There is not an option to offload this further 10/1; right second plantar metatarsal head. MRI booked for October 6. Generally looking better today using silver alginate 10/8; right second plantar metatarsal head. MRI that was done on 10/6 was negative for osteomyelitis  noted prior amputations of the second and fourth toes at the [facility]. Prior amputation of the third toe to the base of the middle phalanx. Prior amputation of the fifth toe to the head of the metatarsal. 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 He does not recall about hearing about this. He did not have any pain but then again he has reduced sensation from diabetic neuropathy 10/15; right second plantar metatarsal head. Still in the same condition callus nonviable subcutaneous tissue which cleans up nicely with debridement but reforms by the next week. The patient tells [name] that he is offloading this is much as he can including taking his wheelchair to dialysis. We have been using silver alginate, changed to silver collagen today 10/22; right second plantar metatarsal head. Wound measures smaller but still thick callus around this wound. Still requiring debridement 11/5 right second plantar metatarsal head. Less callus around the wound circumference but still requiring debridement. There is still some undermining which I cleaned out the wound looks healthy but still considerable punched out depth with a relatively small circumference to the wound there is no palpable bone no purulent drainage 11/12; right second plantar metatarsal head. Small wound with thick callus tissue around the wound and significant undermining. We have been using silver collagen after my continuous debridements of this  area 11/19; patient arrives in today with purulent drainage coming out of the wound in the second third met head area on the right foot. Serosanguineous thick drainage. Specimen obtained for culture. 11/21/2019 on evaluation today patient appears to be doing well with regard to his foot ulcer. The good news is is culture came back negative for any bacteria there was no growth. The other good news is his x-ray also appears to be doing well. The foot is also measuring  much better than what it was. Overall I am very pleased with how things seem to be progressing. 12/22; patient was in [facility] for several weeks due to the death of a family member. He said he stayed off the wound using silver alginate. 01/03/2020. Patient has a small open area with roughly 0.3 cm of circumferential undermining. The orifice looks smaller. We have been using silver collagen. There is not a wound more aggressive way to offload this area as he has a prosthesis on the other leg 1/21; again the same small area is 2 weeks ago. We have been using silver collagen I change that to endoform today I really believe that this is an offloading issue 2/4; the area on the right third met head. We have been using endoform. 2/11 the area on the right third met head we have been using silver alginate. 2/25; the area on the right third met head. There is much less callus on this today. The patient states he is offloading this even more aggressively. As an example he is taking his wheelchair into dialysis on most days 3/4; right third metatarsal head. Again he has eschar over the surface of the wound which I removed with a #3 curette. Some subcutaneous debris. This has 0.8 cm of direct probing depth. I cannot feel any bone here. I did do a culture the area 3/11; right third metatarsal head. Again an eschared area over the wound which almost makes this look superficial. Again debridement reveals a wound with probing depth probably not much different from last week there is no palpable bone no palpable purulence. The patient tells [name] that he is making every effort to offload this area properly. He is taking wheelchair into dialysis etc. There is no option for forefoot offloading or a total contact cast. We used Oasis #1 today 3/18; again callus and eschar over the wound surface. I remove this but still a probing wound although only 3 mm in depth this time. This is an improvement 3/25; again callus  and eschar over the wound surface which I removed the wound is much deeper today at 0.7 cm. There is no palpable bone. Because of the appearance of this a culture was done. I am not going to put the Oasis back in this. Concerned about underlying infection. Notable for the fact that he is just finished doxycycline for the last go round of this 3/30; still 0.7 cm in depth which is disappointing. Culture I gave last time showed a few Staph aureus which is methicillin susceptible. This should have been taken care of by the recent course of doxycycline I gave him. We are using silver alginate 4/6; almost 1 cm in depth. We have been using silver alginate with underlying Bactroban. 4/15; about 1 cm in depth still. There is no palpable bone but there is undermining thick surface again as usual. Our intake nurse noted what looked to be purulent drainage I suppose that could have been Bactroban but I did a culture for  CandS 4/20; depth down 0.7 cm. Culture from last time was negative we have been using Bactroban and silver alginate 4/29; depth down 0.5 cm. Using silver alginate. He reports some drainage 5/6; comes in with the wound worse. Wound is deeper and tunnels. Measures 0.9 depth and an undermining area of 1.8 cm. Objective Constitutional Sitting or standing Blood Pressure is within target range for patient.. Pulse regular and within target range for patient.[name] Respirations regular, non-labored and within target range.. Temperature is normal and within the target range for the patient.[name] Appears in no distress. Vitals Time Taken: 1:54 PM, Height: 73 in, Weight: 202 lbs, BMI: 26.6, Temperature: 98.3 F, Pulse: 79 bpm, Respiratory Rate: 18 breaths/min, Blood Pressure: 102/49 mmHg, Capillary Blood Glucose: 143 mg/dl. General Notes: Wound exam small punched-out hole over the right second metatarsal head. The depth of this is up to 1.9. At 12:00 there is 1.8 cm of tunneling. There is no palpable bone but  this is clearly a lot worse. There is no drainage no purulence. Integumentary (Hair, Skin) Wound #41 status is Open. Original cause of wound was Gradually Appeared. The wound is located on the Right Metatarsal head second. The wound measures 0.5cm length x 0.4cm width x 0.9cm depth; 0.157cm^2 area and 0.141cm^3 volume. There is Fat Layer (Subcutaneous Tissue) Exposed exposed. There is no undermining noted, however, there is tunneling at 12:00 with a maximum distance of 1.8cm. There is a medium amount of serosanguineous drainage noted. The wound margin is well defined and not attached to the wound base. There is large (67-100%) pink, pale granulation within the wound bed. There is no necrotic tissue within the wound bed. 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 Dressing Change Frequency: Wound #41 Right Metatarsal head second: Do not change entire dressing for one week. Skin Barriers/Peri-Wound Care: Moisturizing lotion - right leg. Wound Cleansing: May shower with protection. - patient to use a cast protector. Primary Wound Dressing: Wound #41 Right Metatarsal head second: Cutimed Sorbact - moisten with hydrogel. pack into tunnel and undermining. Secondary Dressing: Wound #41 Right Metatarsal head second: Dry Gauze Other: - felt to offload periwound. Edema Control: Kerlix and Coban - Right Lower Extremity Avoid standing for long periods of time Elevate legs to the level of the heart or above for 30 minutes daily and/or when sitting, a frequency of: - throughout the day. Additional Orders / Instructions: Other: - minimize walking and standing on foot to aid in wound healing and callus buildup. 1. We are going  to pack the area with Sorbact moistened with hydrogel 2. I am still keeping this in a wrap to make sure that the wound is being dressed properly and to provide some additional offloading 3. May need to consider more advanced imaging if this continues to deteriorate Electronic Signature(s) Signed: 05/01/2020 5:30:42 PM By: Linton Ham MD Entered By: Linton Ham on 05/01/2020 14:37:03 -------------------------------------------------------------------------------- SuperBill Details Patient Name: Date of Service: [name] 05/01/2020 Medical Record Number: [id number] Patient Account Number: [id number] Date of Birth/Sex: Treating RN: 1951-10-17 (69 y.o. [name] Primary Care Provider: [name] Other Clinician: Referring Provider: Treating Provider/Extender: Tanna Savoy in Treatment: 46 Diagnosis Coding ICD-10 Codes Code Description [phone] Type 2 diabetes mellitus with foot ulcer L97.512  Non-pressure chronic ulcer of other part of right foot with fat layer exposed I70.298 Other atherosclerosis of native arteries of extremities, other extremity L03.115 Cellulitis of right lower limb Facility Procedures The patient participates with Medicare or their insurance follows the Medicare Facility Guidelines: CPT4 Code Description Modifier Quantity [id number] [place] VISIT-LEV 4 EST PT 1 Physician Procedures : CPT4 Code Description Modifier [id number] - WC PHYS LEVEL 2 - EST PT ICD-10 Diagnosis Description E11.621 Type 2 diabetes mellitus with foot ulcer L97.512 Non-pressure chronic ulcer of other part of right foot with fat layer exposed Quantity: 1 Electronic Signature(s) Signed: 05/01/2020 5:30:42 PM By: Linton Ham MD Signed: 05/01/2020 5:34:26 PM By: Deon Pilling Entered By: Deon Pilling on 05/01/2020 14:59:37
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
## 2020-05-08 ENCOUNTER — Encounter (HOSPITAL_BASED_OUTPATIENT_CLINIC_OR_DEPARTMENT_OTHER): Payer: Medicare Other | Admitting: Internal Medicine

## 2020-05-08 ENCOUNTER — Other Ambulatory Visit: Payer: Self-pay

## 2020-05-08 DIAGNOSIS — E11621 Type 2 diabetes mellitus with foot ulcer: Secondary | ICD-10-CM | POA: Diagnosis not present

## 2020-05-10 NOTE — Progress Notes (Signed)
HAIDAN, NHAN ([id number]) Visit Report for 05/08/2020 SuperBill Details Patient Name: Date of Service: [name], [name] 05/08/2020 Medical Record Number: [id number] Patient Account Number: [id number] Date of Birth/Sex: Treating RN: [birth year] (69 y.o. 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

## 2020-05-13 ENCOUNTER — Encounter (HOSPITAL_BASED_OUTPATIENT_CLINIC_OR_DEPARTMENT_OTHER): Payer: Medicare Other | Admitting: Internal Medicine

## 2020-05-13 DIAGNOSIS — E11621 Type 2 diabetes mellitus with foot ulcer: Secondary | ICD-10-CM | POA: Diagnosis not present
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
## 2020-05-15 NOTE — Progress Notes (Signed)
[name] ([id number]) Visit Report for 05/13/2020 Debridement Details Patient Name: Date of Service: [name] 05/13/2020 9:15 A M Medical Record Number: [id number] Patient Account Number: [id number] Date of Birth/Sex: Treating RN: [birth year] (69 y.o. [name]) Carlene Coria Primary Care Provider: [name] Other Clinician: Referring Provider: Treating Provider/Extender: Tanna Savoy in Treatment: 69 Debridement Performed for Assessment: Wound #41 Right Metatarsal head second Performed By: Physician Ricard Dillon., MD Debridement Type: Debridement Severity of Tissue Pre Debridement: Fat layer exposed Level of Consciousness (Pre-procedure): Awake and Alert Pre-procedure Verification/Time Out Yes - 09:51 Taken: Start Time: 09:51 Pain Control: Other : benzocaine T Area Debrided (L x W): otal 0.4 (cm) x 0.5 (cm) = 0.2 (cm) Tissue and other material debrided: Non-Viable, Callus, Slough, Subcutaneous, Skin: Dermis , Skin: Epidermis, Slough Level: Skin/Subcutaneous Tissue Debridement Description: Excisional Instrument: Curette Bleeding: Moderate Hemostasis Achieved: Pressure End Time: 09:54 Procedural Pain: 0 Post Procedural Pain: 0 Response to Treatment: Procedure was tolerated well Level of Consciousness (Post- Awake and Alert procedure): Post Debridement Measurements of Total Wound Length: (cm) 0.6 Width: (cm) 0.6 Depth: (cm) 1.2 Volume: (cm) 0.339 Character of Wound/Ulcer Post Debridement: Improved Severity of Tissue Post Debridement: Fat layer exposed Post Procedure Diagnosis Same as Pre-procedure Electronic Signature(s) Signed: 05/13/2020 5:13:38 PM By: Carlene Coria RN Signed: 05/15/2020 12:52:55 PM By: Linton Ham MD Entered By: Linton Ham on 05/13/2020 09:58:35 -------------------------------------------------------------------------------- HPI Details Patient Name: Date of Service: [name] B. 05/13/2020 9:15  A M Medical Record Number: [id number] Patient Account Number: [id number] Date of Birth/Sex: Treating RN: 1951-12-21 (69 y.o. [name] Primary Care Provider: [name] Other Clinician: Referring Provider: Treating Provider/Extender: Tanna Savoy in Treatment: 48 History of Present Illness Location: Patient presents with a wound to bilateral feet. Quality: Patient reports experiencing essentially no pain. Severity: Mildly severe wound with no evidence of infection Duration: Patient has had the wound for greater than 2 weeks prior to presenting for treatment HPI Description: The patient is a pleasant 69 yrs old bm here for evaluation of ulcers on the plantar aspect of both feet. 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 He has a left arm graft for access. He has been trying to stay off his feet for weeks but does not seem to have any improvement in the wound. He has been seeing someone at the foot center and was referred to the Wound Care center for further evaluation. 12/30/15 the patient has 3 wounds one over the right first metatarsal head and 2 on the left foot at the left fifth and lleft first metatarsal head. All of these look relatively similar. The one over the left fifth probe to bone I could not prove that any of the others did. I note his MRI in November that did not show osteomyelitis. His peripheral pulses seem robust. All of these underwent surgical debridement to remove callus nonviable skin and subcutaneous tissue 01/06/16; the patient had his sutures removed from the right fifth ray amputation. There may be a small open part of this superiorly but otherwise the incision looks good. Areas over his right first, left first and left fifth metatarsal head all underwent surgical debridement as a varying degree of callus, skin and nonviable subcutaneous tissue. The area that is most worrisome is the  right fifth metatarsal head which has a wound probes precariously close to bone. There is no purulent drainage or erythema 01/13/16;  I'm not exactly sure of the status of the right fifth ray amputation site however he follows with Dr. [name] later this week. The area over his right first plantar metatarsal head, left first and fifth plantar metatarsal head are all in the same status. Thick circumferential callus, nonviable subcutaneous tissue. Culture of the left fifth did not culture last week 01/20/16; all of the patient's wounds appear and roughly the same state although his amputation site on the right lateral foot looks better. His wounds over the right first, left first and left fifth metatarsal heads all underwent difficult surgical debridement removing circumferential callus nonviable skin and subcutaneous tissue. There is no overt evidence of infection in these areas. MRI at the end of December of the left foot did not show osteomyelitis, right foot showed osteomyelitis of the right fifth digit he is now status post amputation. 01/27/16 the patient's wounds over his plantar first and fifth metatarsal heads on the left all appear better having been started on a total contact cast last week. They were debridement of circumferential callus and nonviable subcutaneous tissue as was the wound over the first metatarsal head. His surgical incision on the right also had some light surface debridement done. 03/23/2016 -- the patient was doing really well and most of his wounds had almost completely healed but now he came back today with a history of having a discharge from the area of his right foot on the plantar aspect and also between his first and second toe. He also has had some discharge from the left foot. Addendum: I spoke to the PA [name] at the dialysis center whose fax number is [phone]. We discussed the infection the patient has and she will put the patient on vancomycin and  Fortaz until the final culture report is back. We will fax this as soon as available. 03/30/2016 -- left foot x-ray IMPRESSION:No definitive osteomyelitis noted. X-ray of the right foot -- IMPRESSION: 1. Soft tissue swelling. Prior amputation right fifth digit. No acute or focal bony abnormality identified. If osteomyelitis remains a clinical concern, MRI can be obtained. 2. Peripheral vascular disease. His culture reports have grown an MSSA -- and we will fax this report to his hemodialysis center. He was called in a prescription of oral doxycycline but I have told her not to fill this in as he is already on IV antibiotics. 04/06/2016 -- a few days ago, I spoke to the hemodialysis center nurse who had stopped the IV antibiotics and he was given a prescription for doxycycline 100 mg by mouth twice a day for a week and he is on this at the present time. 05/11/2016 -- he has recently seen his PCP this week and his hemoglobin A1c was 7. He is working on his paperwork to get his orthotic shoes. 05/25/2016 -- -- x-ray of the left foot IMPRESSION:No acute bony abnormality. No radiographic changes of acute osteomyelitis. No change since prior study. X-ray of the right foot -- IMPRESSION: Postsurgical changes are seen involving the fifth toe. No evidence of acute osteomyelitis. he developed a large blister on the medial part of his right foot and this opened out and drain fluid. 06/01/2016 -- he has his MRI to be done this afternoon. 06/15/2016 -- MRI of the left forefoot without contrast shows 2 separate regions of cutaneous and subcutaneous edema and possible ulceration and blistering along the ball of the foot. No obvious osteomyelitis identified. MRI of the right foot showed cutaneous and subcutaneous thickening plantar  to the first digit sesamoid with an ulcer crater but no underlying osteomyelitis is identified. 06/22/2016 -- the right foot plantar ulcer has been draining a lot of seropurulent  material for the last few days. 06/30/2016 -- spoke to the dialysis center and I believe I spoke to [place] a PA at the center who discussed with me and agreed to putting Legrand Como on vancomycin until his cultures arrive. On review of his culture report no WBCs were seen or no organisms were seen and the culture was reincubated for better growth. The final report is back and there were no predominant growth including Streptococcus or Staphylococcus. Clinically though he has a lot of drainage from both wounds a lot of undermining and there is further blebs on the left foot towards the interspace between his first and second toe. 08/10/2016 -- the culture from the right foot showed normal skin flora and there was no Staphylococcus aureus was group A streptococcus isolated. 09/21/2016 -- -- MRI of the right foot was done on 09/13/2016 - IMPRESSION: Findings most consistent with acute osteomyelitis throughout the great toe, sesamoid bones and plantar aspect of the head of the first metatarsal. Fluid in the sheath of the flexor tendon of the great toe could be sympathetic but is worrisome for septic tenosynovitis. First MTP joint effusion worrisome for septic joint. He was admitted to the hospital on 09/11/2016 and treated for a fever with vancomycin and cefepime. He was seen by Dr. Bobby Rumpf of infectious disease who recommended 6 weeks treatment with vancomycin and ceftazidime with his hemodialysis and to continue to see as in the wound clinic. Vascular consult was pending. The patient was discharged home on 09/14/2016 and he would continue with IV antibiotics for 6 weeks. 09/28/16 wound appears reasonably healthy. Continuing with total contact cast 10/05/16 wound is smaller and looking healthy. Continue with total contact cast. He continues on IV antibiotics 10/12/2016 -- he has developed a new wound on the dorsal aspect of his left big toe and this is a superficial injury with no surrounding  cellulitis. He has completed 30 days of IV antibiotics and is now ready to start his hyperbaric oxygen therapy as per his insurance company's recommendation. 10/19/2016 -- after the cast was removed on the right side he has got good resolution of his ulceration on the right plantar foot. he has a new wound on the left plantar foot in the region of his fourth metatarsal and this will need sharp debridement. 10/26/2016 -- Xray of the right foot complete: IMPRESSION: Changes consistent with osteomyelitis involving the head of the first metatarsal and base of the first proximal phalanx. The sesamoid bones are also likely involved given their positioning. 11/02/2016 -- there still awaiting insurance clearance for his hyperbaric oxygen therapy and hopefully he will begin treatment soon. 11/09/2016 -- started with hyperbaric oxygen therapy and had some barotrauma to the right ear and this was seen by ENT who was prescribed Afrin drops and would probably continue with HBO and he is scheduled for myringotomy tubes on Friday 11/16/2016 -- he had his myringotomy tubes placed on Friday and has been doing much better after that with some fluid draining out after hyperbaric treatment today. The pain was much better. 11/30/2016 -- over the last 2 days he noticed a swelling and change of color of his right second toe and this had been draining minimal fluid. 12/07/2016 -- x-ray of the right foot -- IMPRESSION:1. Progressive ulceration at the distal aspect of the second digit  with significant soft tissue swelling and osseous changes in the distal phalanx compatible with osteomyelitis. 2. Chronic osteomyelitis at the first MTP joint. 3. Ulcerations at the second and third toes as well without definite osseous changes. Osteomyelitis is not excluded. 4. Fifth digit amputation. On 12/01/2016 I spoke to Dr. Bobby Rumpf, the infectious disease specialist, who kindly agreed to treat this with IV antibiotics and he would  call in the order to the dialysis center and this has been discussed in detail with the patient who will make the appropriate arrangements. The patient will also book an appointment as soon as possible to see Dr. [name] in the office. Of note the patient has not been on antibiotics this entire week as the dialysis center did not receive any orders from Dr. [name]. I got in touch with Dr. [name] who tells me the patient has an appointment to see him this coming Wednesday. 12/14/2016 -- the patient is on ceftazidime and vancomycin during his dialysis, and I understand this was put on by his nephrologist Dr. Raliegh Ip possibly after speaking with infectious disease Dr. [name] 12/23/16; patient was on my schedule today for a wound evaluation as he had difficulties with his schedule earlier this week. I note that he is on vancomycin and ceftazidine at dialysis. In spite of this he arrives today with a new wound on the base of the right second toe this easily probes to bone. The known wound at the tip of the second toe With this area. He also has a superficial area on the medial aspect of the third toe on the side of the DIP. This does not appear to have much depth. The area on the plantar left foot is a deep area but did not probe the bone 12/28/16 -- he has brought in some lab work and the most recent labs done showed a hemoglobin of 12.1 hematocrit of 36.3, neutrophils of 51% WBC count of 5.6, BN of 53, albumin of 4.1 globulin of 3.7, vancomycin 13 g per mL. 01/04/2017 -- he saw Dr. [name] who has recommended a amputation of the right second toe and he is awaiting this date. He sees Dr. Bobby Rumpf of infectious disease tomorrow. The bone culture taken on 12/28/2016 had no growth in 2 days. 01/11/2017 -- was seen by Dr. Bobby Rumpf regarding the management and has recommended a eval by vascular surgeons. He recommended to continue the antibiotics during his hemodialysis. Xray of the left foot --  IMPRESSION: No acute fracture, dislocation, or osseous erosion identified. 01/18/2017 -- he has his vascular workup later today and he is going to have his right foot second toe amputation this coming Friday by Dr. [name]. We have also put in for a another 30 treatments with hyperbaric oxygen therapy 01/25/2017 -- I have reviewed Dr. Donnetta Hutching is vascular report from last week where he reviewed him and thought his vascular function was good enough to heal his amputation site and no further tests were recommended. He also had his orthopedic related to surgery which is still pending the notes and we will review these next week. 02/01/2017 -- the operative remote of Dr. 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  02/08/2017 --he was seen by Dr. [name] on 02/03/2017 -- patient is on Panaca. after a thorough review he had recommended to continue with antibiotics during hemodialysis. The patient was seen by Dr. [name], but I do not find any follow-up note on the electronic medical record. he has removed the dressing over the right foot to remove the sutures and asked him to see him back in a month's time 02/22/2017 -- patient has been febrile and has been having symptoms of the upper respiratory tract infection but has not been checked for the flu and it's been over 4 days now. He says he is feeling better today. Some drainage between his left first and second toe and this needed to be looked at 03/08/2017 -- was seen by Dr. Bobby Rumpf on 03/07/2017 after review he will stop his antibiotics and see him back in a month to see if he is feeling  well. 03/15/2017 -- he has completed his course of hyperbaric oxygen therapy and is doing fine with his health otherwise. 03/22/2017 -- his nutritionist at the dialysis center has recommended a protein supplement to help build his collagen and we will prescribe this for him when he has the details 05/03/2017 -- he recently noticed the area on the plantar aspect of his fifth metatarsal head which opened out and had minimal drainage. 05/10/2017 -- -- x-ray of the left foot -- IMPRESSION:1. No convincing conventional radiographic evidence of active osteomyelitis.2. Active soft tissue ulceration at the tip of the fifth digit, and at the lateral aspect of the foot adjacent the base of the fifth metatarsal. 3. Surgical changes of prior fourth toe amputation. 4. 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 5. Small vessel atherosclerotic vascular calcifications. 6. 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 ======= Readmission after 5 weeks: 06/15/2017 -- the patient returns after 5 weeks having had an MRI done on 05/17/2017 MRI of the left foot without contrast showed soft tissue ulcer overlying the base of the fifth metatarsal. 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 No drainable fluid collection to suggest an abscess. He was in hospital between 05/16/2017 and 05/25/2017 -- and on discharge was asked to follow-up with Dr. [name] and Dr. [name]. He was treated 2 weeks post discharge with vancomycin plus ceftriaxone with hemodialysis and oral metronidazole 500 mg 3 times a day. 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 last hemoglobin A1c was 6.7% He had a postoperative MR of the foot on 05/23/2017 -- which showed no new areas of cortical bone loss, edema or soft tissue ulceration to  suggest osteomyelitis. the patient has completed his course of IV antibiotics during dialysis and is to see the infectious disease doctor tomorrow. Dr. [name] had seen him after suture removal and asked him to keep the wound with a dry dressing 06/21/2017 -- he was seen by Dr. Bobby Rumpf of infectious disease on 06/16/2017 -- he stopped his Flagyl and will see him back in 6 weeks. He was asked to follow-up with Dr. [name] and with the wound clinic clinic. 07/05/17; bone biopsy from last time showed acute osteomyelitis. This is from the reminiscent in the left fifth metatarsal. Culture result is apparently still pending [holding for anaerobe}. From my understanding in this case this is been a progressive necrotic wound which is deteriorated markedly over  the last 3 weeks since he returned here. He now has a large area of exposed bone which was biopsied and cultured last week. Dr. Graylon Good has put him on vancomycin and Fortaz during his hemodialysis and Flagyl orally. He is to see Dr. [name] next week 07/19/2017 -- the patient was reviewed by Dr. [name] of orthopedics who reviewed the case in detail and agreed with the plan to continue with IV antibiotics, aggressive wound care and hyperbaric oxygen therapy. He would see him back in 3 weeks' time 08/09/2017 -- saw Dr. Bobby Rumpf on 08/03/2017 -he was restarted on his antibiotics for 6 weeks which included vanco, ceftaz and Flagyl. he recommended continuing his antibiotics for 6 weeks and reevaluate his completion date. He would continue with wound care and hyperbaric oxygen therapy. 08/16/2017 -- I understand he will be completing his 6 weeks of antibiotics sometime later this week. 09/19/2017 -- he has been without his wound VAC for the last week due to lack of supplies. He has been packing his wound with silver alginate. 10/04/2017 -- he has an appointment with Dr. [name] tomorrow and hence we will not apply the wound VAC after his dressing  changes today. 10/11/2017 -- he was seen by Dr. Bobby Rumpf on 10/05/2017, noted that the patient was currently off antibiotics, and after thorough review he recommended he follow-up with the wound care as per plan and no further antibiotics at this point. 11/29/17; patient is arrived for the wound on his left lateral foot.Marland Kitchen He is completed IV antibiotics and 2 rounds of hyperbaric oxygen for treatment of underlying osteomyelitis. He arrives today with a surface on most of the wound area however our intake nurse noted drainage from the superior aspect. This was brought to my attention. 12/13/2017 -- the right plantar foot had a large bleb and once the bleb was opened out a large callus and subcutaneous debris was removed and he has a plantar ulcer near the fourth metatarsal head 12/28/17 on evaluation today patient appears to be doing acutely worse in regard to his left foot. The wound which has been appearing to do better as now open up more deeply there is bone palpable at the base of the wound unfortunately. He tells me that this feels "like it did when he had osteomyelitis previously" he also noted that his second toe on the left foot appears to be doing worse and is swollen there does appear to be some fluid collected underneath. His right foot plantar ulcer appears to be doing somewhat better at this point and there really is no complication at the site currently. No fevers, chills, nausea, or vomiting noted at this time. Patient states that he normally has no pain at this site however. T oday he is having significant pain. 01/03/18; culture done last week showed methicillin sensitive staph aureus and group B strep. He was on Septra however he arrives today with a fever of 101. He has a functional dialysis shunt in his left arm but has had no pain here. No cough. He still makes some urine no dysuria he did have abdominal pain nausea and diarrhea over the weekend but is not had any diarrhea since  yesterday. Otherwise he has no specific complaints 01/05/18; the patient returns today in follow-up for his presentation from 1/8. At that point he was febrile. I gave him some Levaquin adjusted for his dialysis status. He tells me the fever broke that night and it is not likely that this was actually a  wound infection. He is gone on to have an MRI of the left foot; this showed interval development of an abnormal signal from the base of the third and fourth metatarsal and in the cuboid reminiscent consistent with osteomyelitis. There is no mention of the left second toe I think which was a concern when it was ordered. The patient is taking his Levaquin 01/10/18; the patient has completed his antibiotics today. The area on the left lateral foot is smaller but it still probes easily to bone. He has underlying osteomyelitis here and sees Dr. Megan Salon of infectious disease this week The area on the right third plantar metatarsal head still requires debridement not much change in dimensions He has a new wound on the medial tip of his right third toe again this probes to bone. He only noticed this 2 days ago 01/17/18; the patient saw Dr. [name] last week and he is back on Vanco and Fortaz at dialysis. 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 We are previously seeing him for a wound also on the base of the fourth med head on the right. X-ray of this area did not show osteomyelitis in relation to a new wound on the tip of his right third toe. Again this probes to bone. Finally his second toe on the left which didn't really show anything on the MRI at least no report appears to have a separated cutaneous area which I think is going to come right off the tip of his toe and leave exposed bone. Whether this is infectious or ischemic I am not clear Culture I did from the third toe last week which was  his new wound was negative. Plain x-ray on the right foot did not show osteomyelitis in the toes 01/24/18; the patient is going went to see Dr. [name]. He is going to have a amputation of the left second and right third toe although paradoxically the left second toe looks better than last week. We have treating a deep probing wound on the left lateral foot and the area over the third metatarsal head on the right foot. 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 Also a debridement including bone of the left lateral foot and a closure which is still sutured. This is a bit surprising. Also apparent debridement of the base of the right third toe wound. 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 I'm not sure exactly where we are in the frame of things here. The wound on the left lateral foot is still sutured also the amputation sites of the left second and right third toe. He is been using Xeroform to the sutured areas silver alginate to the right foot 02/14/18; he continues on Rocephin at dialysis for osteomyelitis. I still don't have a good sense of where we are in the treatment duration. The wounds on the left lateral foot had the sutures removed and this clearly still probes deeply. We debrided the area with a #5 curet. We'll use silver alginate to the wound the area over the right third met head also required debridement of callus skin and subcutaneous tissue and necrotic debris over  the wound surface. This tunnels superiorly but I did not unroofed this today. The areas on the tip of his toes have a crusted surface eschar I did not debridement this either. 02/21/18; he continues on Rocephin at dialysis for osteomyelitis. I don't have a good sense of where we are and the  treatment duration. The wounds on the left lateral foot is callused over on presentation but requires debridement. The area on the right third med head plantar aspect actually is measuring smaller 02/28/18;patient is on Vanco and Fortaz at dialysis, I'm not sure why I thought this was Rocephin above unless it is been changed. I don't have a good sense of time frame here. Using silver collagen to the area on the left lateral foot and right third med head plantar foot 03/07/18; patient is completing bank and Fortaz at dialysis soon. He is using Silver collagen to the area on the left lateral foot and the right third metatarsal head. He has a smaller superficial wound distal to the left lateral foot wound. 03/14/18-he is here in follow-up evaluation for multiple ulcerations to his bilateral feet. He presents with a new ulceration to the plantar aspect of the left foot underneath the blister, this was deroofed to reveal a partial thickness ulcer. He is voicing no complaints or concerns, tolerated dialysis yesterday. We will continue with same treatment plan and he will follow-up next week 03/21/18; the patient has a area on the lateral left foot which still has a small probing area. The overall surface area of the wound is better. He presented with a new ulceration on the left foot plantar fifth metatarsal head last week. The area on the right third metatarsal head appears smaller.using silver alginate to all wound areas. The patient is changing the dressing himself. He is using a Darco forefoot offloading are on the right and a healing sandal on the left 03/28/18; original wound left lateral foot. Still nowhere close to looking like a heeling surface secondary wound on the left lateral foot at the base of his question fifth metatarsal which was new last week Right third metatarsal head. We have been using silver alginate all wounds 04/04/18; the original wound on the left lateral foot again heavy callus  surrounding thick nonviable subcutaneous tissue all requiring debridement. The new wound from 2 weeks ago just near this at the base of the fifth metatarsal head still looks about the same Unfortunately there is been deterioration in the third medical head wound on the right which now probes to bone. I must say this was a superficial wound at one point in time and I really don't have a good frame of reference year. I'll have to go back to look through records about what we know about the right foot. 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 He is also been to see Dr. [name]. Somewhere in here as somebody is ordered a VASCULAR evaluation which was done on 03/22/18. On the right is anterior tibial artery was monophasic triphasic at the posterior tibial artery. On the left anterior tibial artery monophasic posterior tibial artery monophasic. ABI in the right was 1.43 on the left 1.21. TBIs on the right at 0.41 and on the left at 0.32. A vascular consult was recommended and I think has been arranged. 04/11/18; Mr. Metoyer has not had an MRI of the right foot since 2017. Recent x-ray of the right foot done  in January and February was negative. However he has had a major deterioration in the wound over the third met head. He is completed antibiotics last week at dialysis [place]. I think he is going to see vascular in follow-up. The areas on the left lateral foot and left plantar fifth metatarsal head both look satisfactory. I debrided both of these areas although the tissue here looks good. The area on the left lateral foot once probe to bone it certainly does not do that now 04/18/18; MRI of the right foot is on Thursday. He has what looks to be serosanguineous purulent drainage coming out of the wound over the right third metatarsal head today. He completed antibiotics bank and Fortaz 2 weeks ago at  dialysis. He has a vascular follow-up with regards to his arterial insufficiency although I don't exactly see when that is booked. The areas on the left foot including the lateral left foot and the plantar left fifth metatarsal head look about the same. 04/25/18-He is here in follow-up evaluation for bilateral foot ulcers. MRI obtained was negative for osteomyelitis. Wound culture was negative. We will continue with same treatment plan he'll follow-up next week 05/02/18; the right plantar foot wound over the third metatarsal head actually looks better than when I last saw this. His MRI was negative for osteomyelitis wound culture was negative. On the left plantar foot both wounds on the plantar fifth metatarsal head and on the lateral foot both are covered in a very hard circumferential callus. 05/09/18; right plantar foot wound over the third metatarsal head stable from last week. Left plantar foot wound over the fifth metatarsal head also stable but with callus around both wound areas The area on the left lateral foot had thick callus over a surface and I had some thoughts about leaving this intact however it felt boggy. We've been using silver alginate all wounds 05/16/18; since the patient was last here he was hospitalized from 5/15 through 5/19. 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 This grew MRSA. He was placed on vancomycin.MRI of the left foot suggested "progressive" osteomyelitis and bone destruction of the cuboid and the base of the fifth metatarsal. Stable erosive changes of the base of the second metatarsal. I'm wondering if they're aware that he had surgery and debridement in the area of the underlying bone previously by Dr. [name]. In any case, He was also revascularized with an angioplasty and stenting of the left tibial peroneal trunk and angioplasty of the left posterior tibial  artery. He is on vancomycin at dialysis. He has a 2 week follow-up with infectious disease. He has vascular surgery follow-up. He was started on Plavix. 05/23/18; the patient's wound on the lateral left foot at the level of the fifth metatarsal head and the plantar wound on the plantar fifth metatarsal head both look better. The area on the right third metatarsal head still has depth and undermining. We've been using silver alginate. He is on vancomycin. He has been revascularized on the left 05/30/18; he continues on vancomycin at dialysis. Revascularized on the left. The area on the left lateral foot is just about closed. Unfortunately the area over the plantar fifth metatarsal head undermining superiorly as does the area over the right third metatarsal head. Both of these significantly deteriorated from last week. He has appointments next week with infectious disease and orthopedics I delayed  putting on a cast on the left until those appointments which therefore we made we'll bring him in on Friday the 14th with the idea of a cast on the left foot 06/06/18; he continues on vancomycin at dialysis. Revascularized on the left. He arrives today with a ballotable swelling just above the area on the left lateral foot where his previous wound was. He has no pain but he is reasonably insensate. The difficult areas on the plantar left fifth metatarsal head looks stable whereas the area on the right third plantar metatarsal head about the same as last week there is undermining here although I did not unroofed this today. He required a considerable debridement of the swelling on the left lateral foot area and I unroofed an abscess with a copious amount of brown purulent material which I obtained for culture. I don't believe during his recent hospitalization he had any further imaging although I need to review this. Previous cultures from this area done in this clinic showed MRSA, I would be surprised if this is  not what this is currently even though he is on vancomycin. 06/13/18; he continues on vancomycin at dialysis however he finishes this on Sunday and then graduates the doxycycline previously prescribed by Dr. [name]. The abscess site on the lateral foot that I unroofed last time grew moderate amounts of methicillin-resistant staph aureus that is both vancomycin and tetracycline sensitive so we should be okay from that regard. He arrives today in clinic with a connection between the abscess site and the area on the lateral foot. We've been using silver alginate to all his wound areas. The right third metatarsal head wound is measuring smaller. Using silver alginate on both wound areas 06/20/18; transitioning to doxycycline prescribed by Dr. [name]. The abscess site on the lateral foot that I unroofed 2 weeks ago grew MRSA. That area has largely closed down although the lateral part of his wound on the fifth metatarsal head still probes to bone. I suspect all of this was connected. On the right third metatarsal head smaller looking wound but surrounded by nonviable tissue that once again requires debridement using silver alginate on both areas 06/27/18; on doxycycline 100 twice a day. The abscess on the lateral foot has closed down. Although he still has the wound on the left fifth metatarsal head that extends towards the lateral part of the foot. The most lateral part of this wound probes to bone Right third metatarsal head still a deep probing wound with undermining. Nevertheless I elected not to debridement this this week If everything is copious static next week on the left I'm going to attempt a total contact cast 07/04/18; he is tolerating his doxycycline. The abscess on the left lateral foot is closed down although there is still a deep wound here that probes to bone. We will use silver collagen under the total contact cast Silver collagen to the deep wound over the right third metatarsal head is  well 07/11/18; he is tolerating doxycycline. The left lateral fifth plantar metatarsal head still probes deeply but I could not probe any bone. We've been using silver collagen and this will be the second week under the total contact cast Silver collagen to deep wound over the right third metatarsal head as well 07/18/18; he is still taking doxycycline. The left lateral fifth plantar metatarsal this week probes deeply with a large amount of exposed bone. Quite a deterioration. He required extensive debridement. Specimens of bone for pathology and CNS obtained Also  considerable debridement on the right third metatarsal head 07/25/18; bone for pathology last week from the lateral left foot showed osteomyelitis. CandS of the bone Enterobacter. And Klebsiella. He is now on ciprofloxacin and the doxycycline is stopped [Dr. [name] of infectious disease] area He has an appointment with Dr. [name] tomorrow I'm going to leave the total contact cast off for this week. May wish to try to reapply that next week or the week after depending on the wound bed looks. I'm not sure if there is an operative option here, previously is followed with Dr. [name] 08/01/18 on evaluation today patient presents for reevaluation. He has been seen by infectious disease, Dr. [name], and he has placed the patient back on Ceftaz currently to be given to him at dialysis three times a week. 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 Fortunately there does not appear to be the evidence of infection at this point in time. Overall the patient has been tolerating the dressing changes without complication. We are no longer due lives in the cast of left foot secondary to the infection obviously. 08/10/18 on evaluation today patient appears to be doing rather well all things considered in regard to his left plantar foot ulcer. He does have a significant callous on the  lateral portion of his left foot he wonders if I can help clean that away to some degree today. Fortunately he is having no evidence of infection at this time which is good news. No fevers chills noted. With that being said his right plantar foot actually does have a significant callous buildup around the wound opening this seems to not be doing as well as I would like at this point. I do believe that he would benefit from sharp debridement at this site. Subsequently I think a total contact cast would be helpful for the right foot as well. 08/17/18; the patient arrived today with a total contact cast on the right foot. This actually looks quite good. He had gone to see Dr. Megan Salon of infectious disease about the osteomyelitis on the left foot. I think he is on IV Fortaz at dialysis although I am not exactly sure of the rationale for the Fortaz at the time of this dictation. He also arrived in today with a swelling on the lateral left foot this is the site of his original wounds in fact when I first saw this man when he was under Dr. Ardeen Garland care I think this was the site of where the wound was located. It was not particularly tender however using a small scalpel I opened it to August some moderate amount of purulent drainage. 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 Last MRI was in May that showed progressive osteomyelitis and bone destruction of the cuboid and base of the fifth metatarsal. Last week he had an abscess over this same area this was removed. Culture was negative. We are continuing with a total contact cast to the area on the third metatarsal head on the right and making some good progress here. The patient asked me again about renal transplant. He is not on the list because of the open wounds. 08/31/18; apparently the patient had an interruption in the [place] but  he is now back on this at dialysis. Apparently there was a misunderstanding  and Dr. Crissie Figures orders. Due to have the MRI of the left foot tonight. The area on the right foot continues to have callus thick skin and subcutaneous tissue around the wound edge that requires constant debridement however the wound is smaller we are using a total contact cast in this area. Alginate all wounds 09/07/18; without much surprise the MRI of his left foot showed osteomyelitis in the reminiscent of his fourth metatarsal but also the third metatarsal. She arrives in clinic today with the right foot and a total contact cast. There was purulent drainage coming out of the wound which I have cultured. Marked deterioration here with undermining widely around the wound orifice. Using pickups and a scalpel I remove callus and subcutaneous tissue from a substantial new opening. In a similar fashion the area on the left lateral foot that was blistered last week I have opened this and remove skin and subcutaneous tissue from this area to expose a obvious new wound. I think there is extension and communication between all of this on the left foot. The patient is on Fortaz at dialysis. Culture done of the right foot. We are clearly not making progress here. We have made him an appointment with Dr. [name] of orthopedics. I think an amputation of the left leg may be discussed. I don't think there is anything that can be done with foot salvage. 09/14/18; culture from the right foot last time showed a very resistant MRSA. This is resistant to doxycycline. I'm going to try to get linezolid at least for a week. He does not have a appointment with Dr. [name] yet. 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 I'll send a message to Dr. [name] of infectious disease. I want the patient to see Dr. [name] to go over the pros and cons of an amputation which I think will be a BKA. The  patient had a question about whether this is curative or not. I told him that I thought it would be although spread of staph aureus infection is not unheard of. MRI of the right foot was done at the end of April 2019 did not show osteomyelitis. The patient last saw Dr. [name] on 9/3. I'll send Dr. [name] a message. I would really like him to weigh the pros and cons of a BKA on the left otherwise he'll probably need IV antibiotics and perhaps hyperbaric oxygen again. He has not had a good response to this in the past. His MRI earlier this month showed progressive damage in the remnants of the fourth and third metatarsal 09/21/2018; sees Dr. [name] of orthopedics next week to discuss a left BKA in response to the osteomyelitis in the left foot. Using silver alginate to all wound areas. He is completing the Zyvox I did put in for him after last culture showed MRSA 09/29/2018; patient saw Dr. [name] of orthopedics discussed the osteomyelitis in the left foot third and fourth metatarsals. He did not recommend urgent surgery but certainly stated the only surgical option would be a BKA. He is communicating with Dr. [name]. The problem here is the instability of the areas on his foot which constantly generate draining abscesses. I will communicate with Dr. [name] about this. He has an appointment on 10/23 10/05/2018; patient sees Dr. [name] on 10/23. I have sent him a secure message not ordered any additional antibiotics for now. Patient continues to have 2 open areas on the left plantar  foot at the fifth metatarsal head and the lateral aspect of the foot. These have not changed all that much. The area on the right third met head still has thick callus and surrounding subcutaneous tissue we have been using silver alginate 10/12/2018; patient sees Dr. [name] or colleague on 10/23. 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 The area on the right  plantar foot is just about closed. The superior wound on the left fifth metatarsal head is callused over but I am not sure if this is closed. The area below it is about the same. Considerable amount of callus on the lateral foot. We have been using silver alginate to the wounds 10/19/2018; the patient saw Dr. [name] on 10/22. He wishes to try and continue to save the left foot. He has been given 6 weeks of oral ciprofloxacin. We continue to put a total contact cast on the right foot. The area over the fifth metatarsal head on the left is callused over/perhaps healed but I did not remove the callus to find out. He still has the wound on the left lateral midfoot requiring debridement. We have been using silver alginate to all wound areas 10/26/2018 On the left foot our intake nurse noted some purulent drainage from the inferior wound [currently the only one that is not callused over] On the right foot even though he is in total contact cast a considerable amount of thick black callus and surface eschar. On this side we have been using silver alginate under a total contact cast. he remains on ciprofloxacin as prescribed by Dr. [name] 11/02/2018; culture from last week which is done of the probing area on the left midfoot wound showed MRSA "few". I am going to need to contact Dr. [name] which I will do today I think he is going to need IV antibiotics again. The area on the left foot which was callused on the side is clearly separating today all of this was removed a copious amounts of callus and necrotic subcutaneous tissue. The area on the right plantar foot actually remained healthy looking with a healthy granulated base. We are using silver alginate to all wound areas 11/09/2018; unfortunately neither 1 of the patient's wounds areas looks at all satisfactory. On the left he has considerable necrotic debris from the plantar wound laterally over the foot. I removed copious amounts of material  including callus skin and subcutaneous tissue. On the right foot he arrives with undermining and frankly purulent drainage. Specimen obtained for culture and debridement of the callused skin and subcutaneous tissue from around the circumference. Because of this I cannot put him back in a total contact cast but to be truthful we are really unfortunately not making a lot of progress I did put in a secure text message to Dr. [name] wondering about the MRSA on the left foot. He suggested vancomycin although this is not started. I was left wondering if he expected me to send this into dialysis. Has we have more purulence on the right side wait for that result before calling dialysis 11/16/2018; I called dialysis earlier this week to get the vancomycin started. I think he got the first dose on Tuesday. The patient tells me that he fell earlier this week twisting his left foot and ankle and he is swelling. He continues to not look well. He had blood cultures done at dialysis on Tuesday he is not been informed of the results.  12/07/2018; the patient was admitted to hospital from 11/22/2018 through 12/02/2018. He underwent a left BKA. Apparently had staph sepsis. In the meantime being off the right foot this is closed over. He follows up with Dr. 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 Subsequent to this he actually had multiple falls in fact he tells me to following which calls the wound to be his apparently. He has been seeing Dr. [name] and his physician assistant in the interim. They actually did want him to come back for reevaluation to see if there's anything we can do for me wound care perspective the help this area to heal more appropriately. The patient states that he does not have a tremendous amount of pain which is good news. No fevers, chills, nausea, or vomiting noted at this time.  We have gotten approval from Dr. [name] office had [place] for [place] to treat the patient for his stop wound. This is due to the fact that the patient was in the 90 day postop global. 1/21; the patient does not have an open wound on the right foot although he does have some pressure areas that will need to be padded when he is transferring. The dehisced surgical wound looks clean although there is some undermining of 1.5 cm superiorly. 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 This appears healthy but there is undermining. TheraSkin #1 2/11; TheraSkin #2 2/25; TheraSkin #3. Wound is measuring smaller 3/10; TheraSkin #4 wound is measuring smaller 3/24; TheraSkin #5 wound is measuring smaller and looks healthy. 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 He has no open area on the right foot although he does have some callused area over the original third metatarsal head wound. The third metatarsal is also subluxed on the right foot. I have warned him today that he cannot consider wearing a prosthesis for at least a month but he can go for measurements. He is going to need to keep the right foot padded is much as possible in his diabetic shoe indefinitely READMISSION 06/12/2019 Mr. Delo is a type II diabetic on dialysis. He has been in this clinic multiple times with wounds on his bilateral lower extremities. 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 He also has a history of wounds on his right foot but tells me that everything is going well here. He actually came in here with his prosthesis walking with a cane. We were all really quite gratified to see this He states over the last several weeks he has had a painful area at the tip of the left third finger. His dialysis shunt in his is in  the left upper arm and this particularly hurts during dialysis when his fingers get numb and there is a lot of pain in the tip of his left third finger. I believe he is seen vascular surgery. He had a Doppler done to evaluate for a dialysis steal syndrome. He is going for a banding procedure 1 week from today towards the left AV fistula. I think if that is unsuccessful they will be looking at creating a new shunt. He had a course of doxycycline when he which he finished  about a week ago. He has been using Bactroban to the finger 6/25; the patient had his banding procedure and things seem to be going better. He is not having pain in his hand at dialysis. The area on the tip of his finger seems about closed. He did however have a paronychia I the last time I saw him. I have been advising him to use topical Bactroban washing the finger. Culture I did last time showed a few staph epidermidis which I was willing to dismiss as superficial skin contaminant. He arrives today with the finger wound looking better however the paronychia a seems worse 7/9; 2-week follow-up. He does not have an open wound on the tip of his third left finger. I thought he had an initial ischemic wound on the tip of the finger as well as a paronychia a medially. All of this appears to be closed. 8/6-Patient returns after 3 weeks for follow-up and has a right second met head plantar callus with a significant area of maceration hiding an ulcer ABI repeated today on right - 1.26 8/13-Patient returns at 1 week, the right second met head plantar wound appears to be slightly better, it is surrounded by the callus area we are using silver alginate 8/20; the patient came back to clinic 2 weeks ago with a wound on the right second metatarsal head. He apparently told me he developed callus in this area but also went away from his custom shoes to a pair of running shoes. Using silver alginate. He of course has the left BKA and prosthesis on  the left side. There might be much we can do to offload this although the patient tells me he has a wheelchair that he is using to try and stay off the wound is much as possible 8/27; right second metatarsal head. Still small punched out area with thick callus and subcutaneous tissue around the wound. We have been using silver collagen. This is always been an issue with this man's wound especially on this foot 9/3; right second met head. Still the same punched-out area with thick callus and subcutaneous tissue around the wound removing the circumference demonstrates repetitively undermining area. I have removed all the subcutaneous tissue associated with this. We have been using silver collagen 9/15; right second metatarsal head. Same punched-out thick callus and subcutaneous tissue around the wound area. Still requiring debridement we have been using silver collagen I changed back to silver alginate X-ray last time showed no evidence of osteomyelitis. Culture showed Klebsiella and he was started and Keflex apparently just 4 days ago 9/24; right second metatarsal head. He is completed the antibiotics I gave him for 10 days. Same punched-out thick callus and subcutaneous tissue around the area. Still requiring debridement. We have been using silver alginate. The area itself looks swollen and this could be just Laforce that comes on this area with walking on a prosthesis on the left however I think he needs an MRI and I am going to order that today. There is not an option to offload this further 10/1; right second plantar metatarsal head. MRI booked for October 6. Generally looking better today using silver alginate 10/8; right second plantar metatarsal head. MRI that was done on 10/6 was negative for osteomyelitis noted prior amputations of the second and fourth toes at the MTP. Prior amputation of the third toe to the base of the middle phalanx. Prior amputation of the fifth toe to the head of the  metatarsal. 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 He does not recall about hearing about this. He did not have any pain but then again he has reduced sensation from diabetic neuropathy 10/15; right second plantar metatarsal head. Still in the same condition callus nonviable subcutaneous tissue which cleans up nicely with debridement but reforms by the next week. The patient tells me that he is offloading this is much as he can including taking his wheelchair to dialysis. We have been using silver alginate, changed to silver collagen today 10/22; right second plantar metatarsal head. Wound measures smaller but still thick callus around this wound. Still requiring debridement 11/5 right second plantar metatarsal head. Less callus around the wound circumference but still requiring debridement. There is still some undermining which I cleaned out the wound looks healthy but still considerable punched out depth with a relatively small circumference to the wound there is no palpable bone no purulent drainage 11/12; right second plantar metatarsal head. Small wound with thick callus tissue around the wound and significant undermining. We have been using silver collagen after my continuous debridements of this area 11/19; patient arrives in today with purulent drainage coming out of the wound in the second third met head area on the right foot. Serosanguineous thick drainage. Specimen obtained for culture. 11/21/2019 on evaluation today patient appears to be doing well with regard to his foot ulcer. The good news is is culture came back negative for any bacteria there was no growth. The other good news is his x-ray also appears to be doing well. The foot is also measuring much better than what it was. Overall I am very pleased with how things seem to be progressing. 12/22; patient was in [facility] for several weeks due to the death of a family  member. He said he stayed off the wound using silver alginate. 01/03/2020. Patient has a small open area with roughly 0.3 cm of circumferential undermining. The orifice looks smaller. We have been using silver collagen. There is not a wound more aggressive way to offload this area as he has a prosthesis on the other leg 1/21; again the same small area is 2 weeks ago. We have been using silver collagen I change that to endoform today I really believe that this is an offloading issue 2/4; the area on the right third met head. We have been using endoform. 2/11 the area on the right third met head we have been using silver alginate. 2/25; the area on the right third met head. There is much less callus on this today. The patient states he is offloading this even more aggressively. As an example he is taking his wheelchair into dialysis on most days 3/4; right third metatarsal head. Again he has eschar over the surface of the wound which I removed with a #3 curette. Some subcutaneous debris. This has 0.8 cm of direct probing depth. I cannot feel any bone here. I did do a culture the area 3/11; right third metatarsal head. Again an eschared area over the wound which almost makes this look superficial. Again debridement reveals a wound with probing depth probably not much different from last week there is no palpable bone no palpable purulence. The patient tells me that he is making every effort to offload this area properly. He is taking wheelchair into dialysis etc. There is no option for forefoot offloading or a total contact cast. We used Oasis #1 today 3/18; again callus and eschar over the  wound surface. I remove this but still a probing wound although only 3 mm in depth this time. This is an improvement 3/25; again callus and eschar over the wound surface which I removed the wound is much deeper today at 0.7 cm. There is no palpable bone. Because of the appearance of this a culture was done. I am not  going to put the Oasis back in this. Concerned about underlying infection. Notable for the fact that he is just finished doxycycline for the last go round of this 3/30; still 0.7 cm in depth which is disappointing. Culture I gave last time showed a few Staph aureus which is methicillin susceptible. This should have been taken care of by the recent course of doxycycline I gave him. We are using silver alginate 4/6; almost 1 cm in depth. We have been using silver alginate with underlying Bactroban. 4/15; about 1 cm in depth still. There is no palpable bone but there is undermining thick surface again as usual. Our intake nurse noted what looked to be purulent drainage I suppose that could have been Bactroban but I did a culture for CandS 4/20; depth down 0.7 cm. Culture from last time was negative we have been using Bactroban and silver alginate 4/29; depth down 0.5 cm. Using silver alginate. He reports some drainage 5/6; comes in with the wound worse. Wound is deeper and tunnels. Measures 0.9 depth and an undermining area of 1.8 cm. 5/18; there is a change the dressing last week. Still using Sorbact hydrogel. What I can see of the base of the wound looks better. Electronic Signature(s) Signed: 05/15/2020 12:52:55 PM By: Linton Ham MD Entered By: Linton Ham on 05/13/2020 09:59:35 -------------------------------------------------------------------------------- Physical Exam Details Patient Name: Date of Service: Kateri Plummer. 05/13/2020 9:15 A M Medical Record Number: [id number] Patient Account Number: [id number] Date of Birth/Sex: Treating RN: 1951/08/10 (68 y.o. [name] Primary Care Provider: [name] Other Clinician: Referring Provider: Treating Provider/Extender: Tanna Savoy in Treatment: 78 Constitutional Sitting or standing Blood Pressure is within target range for patient.. Pulse regular and within target range for patient.. .  Temperature is normal and within the target range for the patient.. Notes Wound exam; small punched-out hole over the right second metatarsal head. This has considerable depth raised callus subcutaneous tissue. At roughly 10:00 2 cm of maximal tunneling which goes from roughly 9-12 o'clock. There is no palpable bone. What I can see of the base of the wound looks a lot healthier. I used a #5 curette to remove layer after layer of callus skin and subcutaneous tissue. Electronic Signature(s) Signed: 05/15/2020 12:52:55 PM By: Linton Ham MD Entered By: Linton Ham on 05/13/2020 10:01:42 -------------------------------------------------------------------------------- Physician Orders Details Patient Name: Date of Service: LAKIN, ROMER EL B. 05/13/2020 9:15 A M Medical Record Number: [id number] Patient Account Number: [id number] Date of Birth/Sex: Treating RN: [birth year] (68 y.o. 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 Nurse Visit: - 1 week Dressing Change Frequency Wound #41 Right Metatarsal head second Do not change entire dressing for one week. Skin Barriers/Peri-Wound Care  Moisturizing lotion - right leg. Wound Cleansing May shower with protection. - patient to use a cast protector. Primary Wound Dressing Wound #41 Right Metatarsal head second Cutimed Sorbact - moisten with hydrogel. pack into tunnel and undermining. Secondary Dressing Wound #41 Right Metatarsal  head second Dry Gauze Other: - felt to offload periwound. Edema Control Kerlix and Coban - Right Lower Extremity Avoid standing for long periods of time Elevate legs to the level of the heart or above for 30 minutes daily and/or when sitting, a frequency of: - throughout the day. Additional Orders / Instructions Other: - minimize walking and standing on foot to aid in wound healing and callus buildup. Electronic Signature(s) Signed: 05/13/2020 5:13:38 PM By: Carlene Coria RN Signed: 05/15/2020 12:52:55 PM By: Linton Ham MD Entered By: Carlene Coria on 05/13/2020 09:50:50 -------------------------------------------------------------------------------- Problem List Details Patient Name: Date of Service: [name] B. 05/13/2020 9:15 A M Medical Record Number: [id number] Patient Account Number: [id number] Date of Birth/Sex: Treating RN: July 26, 1951 (68 y.o. 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, other extremity 06/12/2019 No Yes L03.115 Cellulitis of right lower limb 11/15/2019 No Yes Inactive Problems ICD-10 Code Description Active Date Inactive Date L03.114 Cellulitis of left upper limb 06/12/2019 06/12/2019 L98.498 Non-pressure chronic ulcer of skin of other sites with other specified severity 06/12/2019 06/12/2019 S61.209D Unspecified open wound of unspecified finger without damage to nail, subsequent 06/12/2019 06/12/2019 encounter Resolved Problems Electronic Signature(s) Signed: 05/15/2020 12:52:55 PM By: Linton Ham MD Entered By:  Linton Ham on 05/13/2020 09:58:04 -------------------------------------------------------------------------------- Progress Note Details Patient Name: Date of Service: [name] B. 05/13/2020 9:15 A M Medical Record Number: [id number] Patient Account Number: [id number] Date of Birth/Sex: Treating RN: 1951-01-23 (68 y.o. [name] Primary Care Provider: [name] Other Clinician: Referring Provider: Treating Provider/Extender: Tanna Savoy in Treatment: 48 Subjective History of Present Illness (HPI) The following HPI elements were documented for the patient's wound: Location: Patient presents with a wound to bilateral feet. Quality: Patient reports experiencing essentially no pain. Severity: Mildly severe wound with no evidence of infection Duration: Patient has had the wound for greater than 2 weeks prior to presenting for treatment The patient is a pleasant 69 yrs old bm here for evaluation of ulcers on the plantar aspect of both feet. 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 He has a left arm graft for access. He has been trying to stay off his feet for weeks but does not seem to have any improvement in the wound. He has been seeing someone at the foot center and was referred to the Wound Care center for further evaluation. 12/30/15 the patient has 3 wounds one over the right first metatarsal head and 2 on the left foot at the left fifth and lleft first metatarsal head. All of these look relatively similar. The one over the left fifth probe to bone I could not prove that any of the others did. I note his MRI in November that did not show osteomyelitis. His peripheral pulses seem robust. All of these underwent surgical debridement to remove callus nonviable  skin and subcutaneous tissue 01/06/16; the patient had his sutures removed from the right fifth ray amputation. There may be a small open  part of this superiorly but otherwise the incision looks good. Areas over his right first, left first and left fifth metatarsal head all underwent surgical debridement as a varying degree of callus, skin and nonviable subcutaneous tissue. The area that is most worrisome is the right fifth metatarsal head which has a wound probes precariously close to bone. There is no purulent drainage or erythema 01/13/16; I'm not exactly sure of the status of the right fifth ray amputation site however he follows with Dr. [name] later this week. The area over his right first plantar metatarsal head, left first and fifth plantar metatarsal head are all in the same status. Thick circumferential callus, nonviable subcutaneous tissue. Culture of the left fifth did not culture last week 01/20/16; all of the patient's wounds appear and roughly the same state although his amputation site on the right lateral foot looks better. His wounds over the right first, left first and left fifth metatarsal heads all underwent difficult surgical debridement removing circumferential callus nonviable skin and subcutaneous tissue. There is no overt evidence of infection in these areas. MRI at the end of December of the left foot did not show osteomyelitis, right foot showed osteomyelitis of the right fifth digit he is now status post amputation. 01/27/16 the patient's wounds over his plantar first and fifth metatarsal heads on the left all appear better having been started on a total contact cast last week. They were debridement of circumferential callus and nonviable subcutaneous tissue as was the wound over the first metatarsal head. His surgical incision on the right also had some light surface debridement done. 03/23/2016 -- the patient was doing really well and most of his wounds had almost completely healed but now he came back today with a history of having a discharge from the area of his right foot on the plantar aspect and also  between his first and second toe. He also has had some discharge from the left foot. Addendum: I spoke to the PA [name] at the dialysis center whose fax number is [phone]. We discussed the infection the patient has and she will put the patient on vancomycin and Fortaz until the final culture report is back. We will fax this as soon as available. 03/30/2016 -- left foot x-ray IMPRESSION:No definitive osteomyelitis noted. X-ray of the right foot -- IMPRESSION: 1. Soft tissue swelling. Prior amputation right fifth digit. No acute or focal bony abnormality identified. If osteomyelitis remains a clinical concern, MRI can be obtained. 2. Peripheral vascular disease. His culture reports have grown an MSSA -- and we will fax this report to his hemodialysis center. He was called in a prescription of oral doxycycline but I have told her not to fill this in as he is already on IV antibiotics. 04/06/2016 -- a few days ago, I spoke to the hemodialysis center nurse who had stopped the IV antibiotics and he was given a prescription for doxycycline 100 mg by mouth twice a day for a week and he is on this at the present time. 05/11/2016 -- he has recently seen his PCP this week and his hemoglobin A1c was 7. He is working on his paperwork to get his orthotic shoes. 05/25/2016 -- -- x-ray of the left foot IMPRESSION:No acute bony abnormality. No radiographic changes of acute osteomyelitis. No change since prior study. X-ray of the right foot --  IMPRESSION: Postsurgical changes are seen involving the fifth toe. No evidence of acute osteomyelitis. he developed a large blister on the medial part of his right foot and this opened out and drain fluid. 06/01/2016 -- he has his MRI to be done this afternoon. 06/15/2016 -- MRI of the left forefoot without contrast shows 2 separate regions of cutaneous and subcutaneous edema and possible ulceration and blistering along the ball of the foot. No obvious  osteomyelitis identified. MRI of the right foot showed cutaneous and subcutaneous thickening plantar to the first digit sesamoid with an ulcer crater but no underlying osteomyelitis is identified. 06/22/2016 -- the right foot plantar ulcer has been draining a lot of seropurulent material for the last few days. 06/30/2016 -- spoke to the dialysis center and I believe I spoke to [place] a PA at the center who discussed with me and agreed to putting Legrand Como on vancomycin until his cultures arrive. On review of his culture report no WBCs were seen or no organisms were seen and the culture was reincubated for better growth. The final report is back and there were no predominant growth including Streptococcus or Staphylococcus. Clinically though he has a lot of drainage from both wounds a lot of undermining and there is further blebs on the left foot towards the interspace between his first and second toe. 08/10/2016 -- the culture from the right foot showed normal skin flora and there was no Staphylococcus aureus was group A streptococcus isolated. 09/21/2016 -- -- MRI of the right foot was done on 09/13/2016 - IMPRESSION: Findings most consistent with acute osteomyelitis throughout the great toe, sesamoid bones and plantar aspect of the head of the first metatarsal. Fluid in the sheath of the flexor tendon of the great toe could be sympathetic but is worrisome for septic tenosynovitis. First MTP joint effusion worrisome for septic joint. He was admitted to the hospital on 09/11/2016 and treated for a fever with vancomycin and cefepime. He was seen by Dr. Bobby Rumpf of infectious disease who recommended 6 weeks treatment with vancomycin and ceftazidime with his hemodialysis and to continue to see as in the wound clinic. Vascular consult was pending. The patient was discharged home on 09/14/2016 and he would continue with IV antibiotics for 6 weeks. 09/28/16 wound appears reasonably healthy.  Continuing with total contact cast 10/05/16 wound is smaller and looking healthy. Continue with total contact cast. He continues on IV antibiotics 10/12/2016 -- he has developed a new wound on the dorsal aspect of his left big toe and this is a superficial injury with no surrounding cellulitis. He has completed 30 days of IV antibiotics and is now ready to start his hyperbaric oxygen therapy as per his insurance company's recommendation. 10/19/2016 -- after the cast was removed on the right side he has got good resolution of his ulceration on the right plantar foot. he has a new wound on the left plantar foot in the region of his fourth metatarsal and this will need sharp debridement. 10/26/2016 -- Xray of the right foot complete: IMPRESSION: Changes consistent with osteomyelitis involving the head of the first metatarsal and base of the first proximal phalanx. The sesamoid bones are also likely involved given their positioning. 11/02/2016 -- there still awaiting insurance clearance for his hyperbaric oxygen therapy and hopefully he will begin treatment soon. 11/09/2016 -- started with hyperbaric oxygen therapy and had some barotrauma to the right ear and this was seen by ENT who was prescribed Afrin drops and would probably continue  with HBO and he is scheduled for myringotomy tubes on Friday 11/16/2016 -- he had his myringotomy tubes placed on Friday and has been doing much better after that with some fluid draining out after hyperbaric treatment today. The pain was much better. 11/30/2016 -- over the last 2 days he noticed a swelling and change of color of his right second toe and this had been draining minimal fluid. 12/07/2016 -- x-ray of the right foot -- IMPRESSION:1. Progressive ulceration at the distal aspect of the second digit with significant soft tissue swelling and osseous changes in the distal phalanx compatible with osteomyelitis. 2. Chronic osteomyelitis at the first MTP joint. 3.  Ulcerations at the second and third toes as well without definite osseous changes. Osteomyelitis is not excluded. 4. Fifth digit amputation. On 12/01/2016 oo I spoke to Dr. Bobby Rumpf, the infectious disease specialist, who kindly agreed to treat this with IV antibiotics and he would call in the order to the dialysis center and this has been discussed in detail with the patient who will make the appropriate arrangements. The patient will also book an appointment as soon as possible to see Dr. [name] in the office. Of note the patient has not been on antibiotics this entire week as the dialysis center did not receive any orders from Dr. [name]. I got in touch with Dr. [name] who tells me the patient has an appointment to see him this coming Wednesday. 12/14/2016 -- the patient is on ceftazidime and vancomycin during his dialysis, and I understand this was put on by his nephrologist Dr. Raliegh Ip possibly after speaking with infectious disease Dr. [name] 12/23/16; patient was on my schedule today for a wound evaluation as he had difficulties with his schedule earlier this week. I note that he is on vancomycin and ceftazidine at dialysis. In spite of this he arrives today with a new wound on the base of the right second toe this easily probes to bone. The known wound at the tip of the second toe With this area. He also has a superficial area on the medial aspect of the third toe on the side of the DIP. This does not appear to have much depth. The area on the plantar left foot is a deep area but did not probe the bone 12/28/16 -- he has brought in some lab work and the most recent labs done showed a hemoglobin of 12.1 hematocrit of 36.3, neutrophils of 51% WBC count of 5.6, BN of 53, albumin of 4.1 globulin of 3.7, vancomycin 13 g per mL. 01/04/2017 -- he saw Dr. [name] who has recommended a amputation of the right second toe and he is awaiting this date. He sees Dr. Bobby Rumpf of infectious  disease tomorrow. The bone culture taken on 12/28/2016 had no growth in 2 days. 01/11/2017 -- was seen by Dr. Bobby Rumpf regarding the management and has recommended a eval by vascular surgeons. He recommended to continue the antibiotics during his hemodialysis. Xray of the left foot -- IMPRESSION: No acute fracture, dislocation, or osseous erosion identified. 01/18/2017 -- he has his vascular workup later today and he is going to have his right foot second toe amputation this coming Friday by Dr. [name]. We have also put in for a another 30 treatments with hyperbaric oxygen therapy 01/25/2017 -- I have reviewed Dr. Donnetta Hutching is vascular report from last week where he reviewed him and thought his vascular function was good enough to heal his amputation site and no further tests were  recommended. He also had his orthopedic related to surgery which is still pending the notes and we will review these next week. 02/01/2017 -- the operative remote of Dr. 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 02/08/2017 --he was seen by Dr. [name] on 02/03/2017 -- patient is on [place]. after a thorough review he had recommended to continue with antibiotics during hemodialysis. The patient was seen by Dr. [name], but I do not find any follow-up note on the electronic medical record. he has removed the dressing over the right foot to remove the sutures and asked him to see him back in a month's time 02/22/2017 -- patient has been febrile and has been having symptoms of the upper respiratory tract infection but has not been checked for the flu and  it's been over 4 days now. He says he is feeling better today. Some drainage between his left first and second toe and this needed to be looked at 03/08/2017 -- was seen by Dr. Bobby Rumpf on 03/07/2017 after review he will stop his antibiotics and see him back in a month to see if he is feeling well. 03/15/2017 -- he has completed his course of hyperbaric oxygen therapy and is doing fine with his health otherwise. 03/22/2017 -- his nutritionist at the dialysis center has recommended a protein supplement to help build his collagen and we will prescribe this for him when he has the details 05/03/2017 -- he recently noticed the area on the plantar aspect of his fifth metatarsal head which opened out and had minimal drainage. 05/10/2017 -- -- x-ray of the left foot -- IMPRESSION:1. No convincing conventional radiographic evidence of active osteomyelitis.2. Active soft tissue ulceration at the tip of the fifth digit, and at the lateral aspect of the foot adjacent the base of the fifth metatarsal. 3. Surgical changes of prior fourth toe amputation. 4. 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 5. Small vessel atherosclerotic vascular calcifications. 6. 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 ======= Readmission after 5 weeks: 06/15/2017 -- the patient returns after 5 weeks having had an MRI done on 05/17/2017 MRI of the left foot without contrast showed soft tissue ulcer overlying the base of the fifth metatarsal. 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 No drainable fluid collection to suggest an abscess. He was in hospital between 05/16/2017 and 05/25/2017 -- and on discharge was asked to follow-up with Dr. [name] and Dr. [name]. He was treated 2 weeks post discharge with vancomycin plus ceftriaxone with hemodialysis and oral metronidazole 500 mg 3 times a day. The  patient underwent a left fifth ray amputation and excision on 5/23, but continued to have postoperative spikes of fever. last hemoglobin A1c was 6.7% He had a postoperative MR of the foot on 05/23/2017 -- which showed no new areas of cortical bone loss, edema or soft tissue ulceration to suggest osteomyelitis. the patient has completed his course of IV antibiotics during dialysis and is to see the infectious disease doctor tomorrow. Dr. [name] had seen him after suture removal and asked him to keep the wound with  a dry dressing 06/21/2017 -- he was seen by Dr. Bobby Rumpf of infectious disease on 06/16/2017 -- he stopped his Flagyl and will see him back in 6 weeks. He was asked to follow-up with Dr. [name] and with the wound clinic clinic. 07/05/17; bone biopsy from last time showed acute osteomyelitis. This is from the reminiscent in the left fifth metatarsal. Culture result is apparently still pending [holding for anaerobe}. From my understanding in this case this is been a progressive necrotic wound which is deteriorated markedly over the last 3 weeks since he returned here. He now has a large area of exposed bone which was biopsied and cultured last week. Dr. Graylon Good has put him on vancomycin and Fortaz during his hemodialysis and Flagyl orally. He is to see Dr. [name] next week 07/19/2017 -- the patient was reviewed by Dr. [name] of orthopedics who reviewed the case in detail and agreed with the plan to continue with IV antibiotics, aggressive wound care and hyperbaric oxygen therapy. He would see him back in 3 weeks' time 08/09/2017 -- saw Dr. Bobby Rumpf on 08/03/2017 -he was restarted on his antibiotics for 6 weeks which included vanco, ceftaz and Flagyl. he recommended continuing his antibiotics for 6 weeks and reevaluate his completion date. He would continue with wound care and hyperbaric oxygen therapy. 08/16/2017 -- I understand he will be completing his 6 weeks of antibiotics  sometime later this week. 09/19/2017 -- he has been without his wound VAC for the last week due to lack of supplies. He has been packing his wound with silver alginate. 10/04/2017 -- he has an appointment with Dr. [name] tomorrow and hence we will not apply the wound VAC after his dressing changes today. 10/11/2017 -- he was seen by Dr. Bobby Rumpf on 10/05/2017, noted that the patient was currently off antibiotics, and after thorough review he recommended he follow-up with the wound care as per plan and no further antibiotics at this point. 11/29/17; patient is arrived for the wound on his left lateral foot.Marland Kitchen He is completed IV antibiotics and 2 rounds of hyperbaric oxygen for treatment of underlying osteomyelitis. He arrives today with a surface on most of the wound area however our intake nurse noted drainage from the superior aspect. This was brought to my attention. 12/13/2017 -- the right plantar foot had a large bleb and once the bleb was opened out a large callus and subcutaneous debris was removed and he has a plantar ulcer near the fourth metatarsal head 12/28/17 on evaluation today patient appears to be doing acutely worse in regard to his left foot. The wound which has been appearing to do better as now open up more deeply there is bone palpable at the base of the wound unfortunately. He tells me that this feels "like it did when he had osteomyelitis previously" he also noted that his second toe on the left foot appears to be doing worse and is swollen there does appear to be some fluid collected underneath. His right foot plantar ulcer appears to be doing somewhat better at this point and there really is no complication at the site currently. No fevers, chills, nausea, or vomiting noted at this time. Patient states that he normally has no pain at this site however. T oday he is having significant pain. 01/03/18; culture done last week showed methicillin sensitive staph aureus and group B  strep. He was on [place] however he arrives today with a fever of 101. He has a functional dialysis shunt  in his left arm but has had no pain here. No cough. He still makes some urine no dysuria he did have abdominal pain nausea and diarrhea over the weekend but is not had any diarrhea since yesterday. Otherwise he has no specific complaints 01/05/18; the patient returns today in follow-up for his presentation from 1/8. At that point he was febrile. I gave him some Levaquin adjusted for his dialysis status. He tells me the fever broke that night and it is not likely that this was actually a wound infection. He is gone on to have an MRI of the left foot; this showed interval development of an abnormal signal from the base of the third and fourth metatarsal and in the cuboid reminiscent consistent with osteomyelitis. There is no mention of the left second toe I think which was a concern when it was ordered. The patient is taking his Levaquin 01/10/18; the patient has completed his antibiotics today. The area on the left lateral foot is smaller but it still probes easily to bone. He has underlying osteomyelitis here and sees Dr. Megan Salon of infectious disease this week ooThe area on the right third plantar metatarsal head still requires debridement not much change in dimensions [facility] has a new wound on the medial tip of his right third toe again this probes to bone. He only noticed this 2 days ago 01/17/18; the patient saw Dr. [name] last week and he is back on Vanco and Fortaz at dialysis. 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 We are previously seeing him for a wound also on the base of the fourth med head on the right. X-ray of this area did not show osteomyelitis in relation to a new wound on the tip of his right third toe. Again this probes to bone. Finally his second toe on the left  which didn't really show anything on the MRI at least no report appears to have a separated cutaneous area which I think is going to come right off the tip of his toe and leave exposed bone. Whether this is infectious or ischemic I am not clear Culture I did from the third toe last week which was his new wound was negative. Plain x-ray on the right foot did not show osteomyelitis in the toes 01/24/18; the patient is going went to see Dr. [name]. He is going to have a amputation of the left second and right third toe although paradoxically the left second toe looks better than last week. We have treating a deep probing wound on the left lateral foot and the area over the third metatarsal head on the right foot. 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 Also a debridement including bone of the left lateral foot and a closure which is still sutured. This is a bit surprising. Also apparent debridement of the base of the right third toe wound. 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 I'm not sure exactly where we are in the frame of things here. The wound on the left lateral foot is still sutured also the amputation sites of the left second and right third toe. He is  been using Xeroform to the sutured areas silver alginate to the right foot 02/14/18; he continues on Rocephin at dialysis for osteomyelitis. I still don't have a good sense of where we are in the treatment duration. The wounds on the left lateral foot had the sutures removed and this clearly still probes deeply. We debrided the area with a #5 curet. We'll use silver alginate to the wound oothe area over the right third met head also required debridement of callus skin and subcutaneous tissue and necrotic debris  over the wound surface. This tunnels superiorly but I did not unroofed this today. ooThe areas on the tip of his toes have a crusted surface eschar I did not debridement this either. 02/21/18; he continues on Rocephin at dialysis for osteomyelitis. I don't have a good sense of where we are and the treatment duration. The wounds on the left lateral foot is callused over on presentation but requires debridement. The area on the right third med head plantar aspect actually is measuring smaller 02/28/18;patient is on Vanco and Fortaz at dialysis, I'm not sure why I thought this was Rocephin above unless it is been changed. I don't have a good sense of time frame here. Using silver collagen to the area on the left lateral foot and right third med head plantar foot 03/07/18; patient is completing bank and Fortaz at dialysis soon. He is using Silver collagen to the area on the left lateral foot and the right third metatarsal head. He has a smaller superficial wound distal to the left lateral foot wound. 03/14/18-he is here in follow-up evaluation for multiple ulcerations to his bilateral feet. He presents with a new ulceration to the plantar aspect of the left foot underneath the blister, this was deroofed to reveal a partial thickness ulcer. He is voicing no complaints or concerns, tolerated dialysis yesterday. We will continue with same treatment plan and he will follow-up next week 03/21/18; the patient has a area on the lateral left foot which still has a small probing area. The overall surface area of the wound is better. He presented with a new ulceration on the left foot plantar fifth metatarsal head last week. The area on the right third metatarsal head appears smaller.using silver alginate to all wound areas. The patient is changing the dressing himself. He is using a [place] forefoot offloading are on the right and a healing sandal on the left 03/28/18; original wound left lateral foot. Still nowhere close  to looking like a heeling surface oosecondary wound on the left lateral foot at the base of his question fifth metatarsal which was new last week ooRight third metatarsal head. ooWe have been using silver alginate all wounds 04/04/18; the original wound on the left lateral foot again heavy callus surrounding thick nonviable subcutaneous tissue all requiring debridement. The new wound from 2 weeks ago just near this at the base of the fifth metatarsal head still looks about the same ooUnfortunately there is been deterioration in the third medical head wound on the right which now probes to bone. I must say this was a superficial wound at one point in time and I really don't have a good frame of reference year. I'll have to go back to look through records about what we know about the right foot. 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 He is also been to see Dr. [name]. Somewhere in here as somebody  is ordered a VASCULAR evaluation which was done on 03/22/18. On the right is anterior tibial artery was monophasic triphasic at the posterior tibial artery. On the left anterior tibial artery monophasic posterior tibial artery monophasic. ABI in the right was 1.43 on the left 1.21. TBIs on the right at 0.41 and on the left at 0.32. A vascular consult was recommended and I think has been arranged. 04/11/18; Mr. Elms has not had an MRI of the right foot since 2017. Recent x-ray of the right foot done in January and February was negative. However he has had a major deterioration in the wound over the third met head. He is completed antibiotics last week at dialysis [place]. I think he is going to see vascular in follow-up. The areas on the left lateral foot and left plantar fifth metatarsal head both look satisfactory. I debrided both of these areas although the tissue here looks good. The area on the  left lateral foot once probe to bone it certainly does not do that now 04/18/18; MRI of the right foot is on Thursday. He has what looks to be serosanguineous purulent drainage coming out of the wound over the right third metatarsal head today. He completed antibiotics bank and Fortaz 2 weeks ago at dialysis. He has a vascular follow-up with regards to his arterial insufficiency although I don't exactly see when that is booked. The areas on the left foot including the lateral left foot and the plantar left fifth metatarsal head look about the same. 04/25/18-He is here in follow-up evaluation for bilateral foot ulcers. MRI obtained was negative for osteomyelitis. Wound culture was negative. We will continue with same treatment plan he'll follow-up next week 05/02/18; the right plantar foot wound over the third metatarsal head actually looks better than when I last saw this. His MRI was negative for osteomyelitis wound culture was negative. ooOn the left plantar foot both wounds on the plantar fifth metatarsal head and on the lateral foot both are covered in a very hard circumferential callus. 05/09/18; right plantar foot wound over the third metatarsal head stable from last week. ooLeft plantar foot wound over the fifth metatarsal head also stable but with callus around both wound areas ooThe area on the left lateral foot had thick callus over a surface and I had some thoughts about leaving this intact however it felt boggy. ooWe've been using silver alginate all wounds 05/16/18; since the patient was last here he was hospitalized from 5/15 through 5/19. 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 This grew MRSA. He was placed on vancomycin.MRI of the left foot suggested "progressive" osteomyelitis and bone destruction of the cuboid and the base of the fifth metatarsal. Stable erosive changes of the  base of the second metatarsal. I'm wondering if they're aware that he had surgery and debridement in the area of the underlying bone previously by Dr. [name]. In any case, He was also revascularized with an angioplasty and stenting of the left tibial peroneal trunk and angioplasty of the left posterior tibial artery. He is on vancomycin at dialysis. He has a 2 week follow-up with infectious disease. He has vascular surgery follow-up. He was started on Plavix. 05/23/18; the patient's wound on the lateral left foot at the level of the fifth metatarsal head and the plantar wound on the plantar fifth metatarsal head both look better. The area  on the right third metatarsal head still has depth and undermining. We've been using silver alginate. He is on vancomycin. He has been revascularized on the left 05/30/18; he continues on vancomycin at dialysis. Revascularized on the left. The area on the left lateral foot is just about closed. Unfortunately the area over the plantar fifth metatarsal head undermining superiorly as does the area over the right third metatarsal head. Both of these significantly deteriorated from last week. He has appointments next week with infectious disease and orthopedics I delayed putting on a cast on the left until those appointments which therefore we made we'll bring him in on Friday the 14th with the idea of a cast on the left foot 06/06/18; he continues on vancomycin at dialysis. Revascularized on the left. He arrives today with a ballotable swelling just above the area on the left lateral foot where his previous wound was. He has no pain but he is reasonably insensate. The difficult areas on the plantar left fifth metatarsal head looks stable whereas the area on the right third plantar metatarsal head about the same as last week there is undermining here although I did not unroofed this today. He required a considerable debridement of the swelling on the left lateral foot area and  I unroofed an abscess with a copious amount of brown purulent material which I obtained for culture. I don't believe during his recent hospitalization he had any further imaging although I need to review this. Previous cultures from this area done in this clinic showed MRSA, I would be surprised if this is not what this is currently even though he is on vancomycin. 06/13/18; he continues on vancomycin at dialysis however he finishes this on Sunday and then graduates the doxycycline previously prescribed by Dr. [name]. The abscess site on the lateral foot that I unroofed last time grew moderate amounts of methicillin-resistant staph aureus that is both vancomycin and tetracycline sensitive so we should be okay from that regard. He arrives today in clinic with a connection between the abscess site and the area on the lateral foot. We've been using silver alginate to all his wound areas. The right third metatarsal head wound is measuring smaller. Using silver alginate on both wound areas 06/20/18; transitioning to doxycycline prescribed by Dr. [name]. The abscess site on the lateral foot that I unroofed 2 weeks ago grew MRSA. That area has largely closed down although the lateral part of his wound on the fifth metatarsal head still probes to bone. I suspect all of this was connected. ooOn the right third metatarsal head smaller looking wound but surrounded by nonviable tissue that once again requires debridement using silver alginate on both areas 06/27/18; on doxycycline 100 twice a day. The abscess on the lateral foot has closed down. Although he still has the wound on the left fifth metatarsal head that extends towards the lateral part of the foot. The most lateral part of this wound probes to bone ooRight third metatarsal head still a deep probing wound with undermining. Nevertheless I elected not to debridement this this week ooIf everything is copious static next week on the left I'm going to  attempt a total contact cast 07/04/18; he is tolerating his doxycycline. The abscess on the left lateral foot is closed down although there is still a deep wound here that probes to bone. We will use silver collagen under the total contact cast Silver collagen to the deep wound over the right third metatarsal head is well 07/11/18;  he is tolerating doxycycline. The left lateral fifth plantar metatarsal head still probes deeply but I could not probe any bone. We've been using silver collagen and this will be the second week under the total contact cast ooSilver collagen to deep wound over the right third metatarsal head as well 07/18/18; he is still taking doxycycline. The left lateral fifth plantar metatarsal this week probes deeply with a large amount of exposed bone. Quite a deterioration. He required extensive debridement. Specimens of bone for pathology and CNS obtained ooAlso considerable debridement on the right third metatarsal head 07/25/18; bone for pathology last week from the lateral left foot showed osteomyelitis. CandS of the bone Enterobacter. And Klebsiella. He is now on ciprofloxacin and the doxycycline is stopped [Dr. [name] of infectious disease] area He has an appointment with Dr. [name] tomorrow I'm going to leave the total contact cast off for this week. May wish to try to reapply that next week or the week after depending on the wound bed looks. I'm not sure if there is an operative option here, previously is followed with Dr. [name] 08/01/18 on evaluation today patient presents for reevaluation. He has been seen by infectious disease, Dr. [name], and he has placed the patient back on Ceftaz currently to be given to him at dialysis three times a week. 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 Fortunately there does not appear to be the evidence of infection at this point in time. Overall the patient has been  tolerating the dressing changes without complication. We are no longer due lives in the cast of left foot secondary to the infection obviously. 08/10/18 on evaluation today patient appears to be doing rather well all things considered in regard to his left plantar foot ulcer. He does have a significant callous on the lateral portion of his left foot he wonders if I can help clean that away to some degree today. Fortunately he is having no evidence of infection at this time which is good news. No fevers chills noted. With that being said his right plantar foot actually does have a significant callous buildup around the wound opening this seems to not be doing as well as I would like at this point. I do believe that he would benefit from sharp debridement at this site. Subsequently I think a total contact cast would be helpful for the right foot as well. 08/17/18; the patient arrived today with a total contact cast on the right foot. This actually looks quite good. He had gone to see Dr. Megan Salon of infectious disease about the osteomyelitis on the left foot. I think he is on IV Fortaz at dialysis although I am not exactly sure of the rationale for the Fortaz at the time of this dictation. He also arrived in today with a swelling on the lateral left foot this is the site of his original wounds in fact when I first saw this man when he was under Dr. Ardeen Garland care I think this was the site of where the wound was located. It was not particularly tender however using a small scalpel I opened it to [place] some moderate amount of purulent drainage. 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 Last MRI was in May that showed progressive osteomyelitis and bone destruction of the cuboid and  base of the fifth metatarsal. Last week he had an abscess over this same area this was removed. Culture  was negative. ooWe are continuing with a total contact cast to the area on the third metatarsal head on the right and making some good progress here. The patient asked me again about renal transplant. He is not on the list because of the open wounds. 08/31/18; apparently the patient had an interruption in the [place] but he is now back on this at dialysis. Apparently there was a misunderstanding and Dr. Crissie Figures orders. Due to have the MRI of the left foot tonight. ooThe area on the right foot continues to have callus thick skin and subcutaneous tissue around the wound edge that requires constant debridement however the wound is smaller we are using a total contact cast in this area. Alginate all wounds 09/07/18; without much surprise the MRI of his left foot showed osteomyelitis in the reminiscent of his fourth metatarsal but also the third metatarsal. She arrives in clinic today with the right foot and a total contact cast. There was purulent drainage coming out of the wound which I have cultured. Marked deterioration here with undermining widely around the wound orifice. Using pickups and a scalpel I remove callus and subcutaneous tissue from a substantial new opening. ooIn a similar fashion the area on the left lateral foot that was blistered last week I have opened this and remove skin and subcutaneous tissue from this area to expose a obvious new wound. I think there is extension and communication between all of this on the left foot. ooThe patient is on Fortaz at dialysis. Culture done of the right foot. We are clearly not making progress here. ooWe have made him an appointment with Dr. [name] of orthopedics. I think an amputation of the left leg may be discussed. I don't think there is anything that can be done with foot salvage. 09/14/18; culture from the right foot last time showed a very resistant MRSA. This is resistant to doxycycline. I'm going to try to get linezolid at least for  a week. He does not have a appointment with Dr. [name] yet. 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 I'll send a message to Dr. [name] of infectious disease. I want the patient to see Dr. [name] to go over the pros and cons of an amputation which I think will be a BKA. The patient had a question about whether this is curative or not. I told him that I thought it would be although spread of staph aureus infection is not unheard of. MRI of the right foot was done at the end of April 2019 did not show osteomyelitis. The patient last saw Dr. [name] on 9/3. I'll send Dr. [name] a message. I would really like him to weigh the pros and cons of a BKA on the left otherwise he'll probably need IV antibiotics and perhaps hyperbaric oxygen again. He has not had a good response to this in the past. His MRI earlier this month showed progressive damage in the remnants of the fourth and third metatarsal 09/21/2018; sees Dr. [name] of orthopedics next week to discuss a left BKA in response to the osteomyelitis in the left foot. Using silver alginate to all wound areas. He is completing the Zyvox I did put in for him after last culture showed MRSA 09/29/2018; patient saw Dr. [name] of orthopedics discussed the osteomyelitis in the left foot  third and fourth metatarsals. He did not recommend urgent surgery but certainly stated the only surgical option would be a BKA. He is communicating with Dr. [name]. The problem here is the instability of the areas on his foot which constantly generate draining abscesses. I will communicate with Dr. [name] about this. He has an appointment on 10/23 10/05/2018; patient sees Dr. [name] on 10/23. I have sent him a secure message not ordered any additional antibiotics for now. Patient continues to have 2 open areas on the left plantar foot at the fifth metatarsal head and the lateral aspect of the foot. These have not  changed all that much. The area on the right third met head still has thick callus and surrounding subcutaneous tissue we have been using silver alginate 10/12/2018; patient sees Dr. [name] or colleague on 10/23. 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 The area on the right plantar foot is just about closed. The superior wound on the left fifth metatarsal head is callused over but I am not sure if this is closed. The area below it is about the same. Considerable amount of callus on the lateral foot. We have been using silver alginate to the wounds 10/19/2018; the patient saw Dr. [name] on 10/22. He wishes to try and continue to save the left foot. He has been given 6 weeks of oral ciprofloxacin. We continue to put a total contact cast on the right foot. The area over the fifth metatarsal head on the left is callused over/perhaps healed but I did not remove the callus to find out. He still has the wound on the left lateral midfoot requiring debridement. We have been using silver alginate to all wound areas 10/26/2018 ooOn the left foot our intake nurse noted some purulent drainage from the inferior wound [currently the only one that is not callused over] ooOn the right foot even though he is in total contact cast a considerable amount of thick black callus and surface eschar. On this side we have been using silver alginate under a total contact cast. oohe remains on ciprofloxacin as prescribed by Dr. [name] 11/02/2018; culture from last week which is done of the probing area on the left midfoot wound showed MRSA "few". I am going to need to contact Dr. [name] which I will do today I think he is going to need IV antibiotics again. The area on the left foot which was callused on the side is clearly separating today all of this was removed a copious amounts of callus and necrotic subcutaneous tissue. ooThe area on the right  plantar foot actually remained healthy looking with a healthy granulated base. ooWe are using silver alginate to all wound areas 11/09/2018; unfortunately neither 1 of the patient's wounds areas looks at all satisfactory. On the left he has considerable necrotic debris from the plantar wound laterally over the foot. I removed copious amounts of material including callus skin and subcutaneous tissue. On the right foot he arrives with undermining and frankly purulent drainage. Specimen obtained for culture and debridement of the callused skin and subcutaneous tissue from around the circumference. Because of this I cannot put him back in a total contact cast but to be truthful we are really unfortunately not making a lot of progress I did put in a secure text message to Dr. [name] wondering about the MRSA on the left foot. He suggested vancomycin although this is not started. I was left  wondering if he expected me to send this into dialysis. Has we have more purulence on the right side wait for that result before calling dialysis 11/16/2018; I called dialysis earlier this week to get the vancomycin started. I think he got the first dose on Tuesday. The patient tells me that he fell earlier this week twisting his left foot and ankle and he is swelling. He continues to not look well. He had blood cultures done at dialysis on Tuesday he is not been informed of the results. 12/07/2018; the patient was admitted to hospital from 11/22/2018 through 12/02/2018. He underwent a left BKA. Apparently had staph sepsis. In the meantime being off the right foot this is closed over. He follows up with Dr. 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 Subsequent to this he actually had multiple falls in fact he tells me to following which calls the wound to be his apparently. He has been seeing Dr. [name] and  his physician assistant in the interim. They actually did want him to come back for reevaluation to see if there's anything we can do for me wound care perspective the help this area to heal more appropriately. The patient states that he does not have a tremendous amount of pain which is good news. No fevers, chills, nausea, or vomiting noted at this time. We have gotten approval from Dr. [name] office had [place] for [place] to treat the patient for his stop wound. This is due to the fact that the patient was in the 90 day postop global. 1/21; the patient does not have an open wound on the right foot although he does have some pressure areas that will need to be padded when he is transferring. The dehisced surgical wound looks clean although there is some undermining of 1.5 cm superiorly. 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 This appears healthy but there is undermining. TheraSkin #1 2/11; TheraSkin #2 2/25; TheraSkin #3. Wound is measuring smaller 3/10; TheraSkin #4 wound is measuring smaller 3/24; TheraSkin #5 wound is measuring smaller and looks healthy. 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 He has no open area on the right foot although he does have some callused area over the original third metatarsal head wound. The third metatarsal is also subluxed on the right foot. I have warned him today that he cannot consider wearing a prosthesis for at least a month but he can go for measurements. He is going to need to keep the right foot padded is much as possible in his diabetic shoe indefinitely READMISSION 06/12/2019 Mr. Motsinger is a type II diabetic on dialysis. He has been in this clinic multiple times with wounds on his bilateral lower extremities. Most recently here at the beginning of this year for 3 months with a wound on his left BKA amputation site which we managed to get  to close over. He also has a history of wounds on his right foot but tells me that everything is going well here. He actually came in here with his prosthesis walking with a cane. We were all really quite gratified to see this He states over the last several weeks he has had a painful area at the tip of the left third finger. His dialysis shunt in his is in the  left upper arm and this particularly hurts during dialysis when his fingers get numb and there is a lot of pain in the tip of his left third finger. I believe he is seen vascular surgery. He had a Doppler done to evaluate for a dialysis steal syndrome. He is going for a banding procedure 1 week from today towards the left AV fistula. I think if that is unsuccessful they will be looking at creating a new shunt. He had a course of doxycycline when he which he finished about a week ago. He has been using Bactroban to the finger 6/25; the patient had his banding procedure and things seem to be going better. He is not having pain in his hand at dialysis. The area on the tip of his finger seems about closed. He did however have a paronychia I the last time I saw him. I have been advising him to use topical Bactroban washing the finger. Culture I did last time showed a few staph epidermidis which I was willing to dismiss as superficial skin contaminant. He arrives today with the finger wound looking better however the paronychia a seems worse 7/9; 2-week follow-up. He does not have an open wound on the tip of his third left finger. I thought he had an initial ischemic wound on the tip of the finger as well as a paronychia a medially. All of this appears to be closed. 8/6-Patient returns after 3 weeks for follow-up and has a right second met head plantar callus with a significant area of maceration hiding an ulcer ABI repeated today on right - 1.26 8/13-Patient returns at 1 week, the right second met head plantar wound appears to be slightly better,  it is surrounded by the callus area we are using silver alginate 8/20; the patient came back to clinic 2 weeks ago with a wound on the right second metatarsal head. He apparently told me he developed callus in this area but also went away from his custom shoes to a pair of running shoes. Using silver alginate. 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 There might be much we can do to offload this although the patient tells me he has a wheelchair that he is using to try and stay off the wound is much as possible 8/27; right second metatarsal head. Still small punched out area with thick callus and subcutaneous tissue around the wound. We have been using silver collagen. This is always been an issue with this man's wound especially on this foot 9/3; right second met head. Still the same punched-out area with thick callus and subcutaneous tissue around the wound removing the circumference demonstrates repetitively undermining area. I have removed all the subcutaneous tissue associated with this. We have been using silver collagen 9/15; right second metatarsal head. Same punched-out thick callus and subcutaneous tissue around the wound area. Still requiring debridement we have been using silver collagen I changed back to silver alginate X-ray last time showed no evidence of osteomyelitis. Culture showed Klebsiella and he was started and Keflex apparently just 4 days ago 9/24; right second metatarsal head. He is completed the antibiotics I gave him for 10 days. Same punched-out thick callus and subcutaneous tissue around the area. Still requiring debridement. We have been using silver alginate. The area itself looks swollen and this could be just [place] that comes on this area with walking on a prosthesis on the left however I think he needs an MRI and I  am going to order that today. There is not an option to offload this further 10/1; right second plantar metatarsal head. MRI booked  for October 6. Generally looking better today using silver alginate 10/8; right second plantar metatarsal head. MRI that was done on 10/6 was negative for osteomyelitis noted prior amputations of the second and fourth toes at the [facility]. Prior amputation of the third toe to the base of the middle phalanx. Prior amputation of the fifth toe to the head of the metatarsal. 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 He does not recall about hearing about this. He did not have any pain but then again he has reduced sensation from diabetic neuropathy 10/15; right second plantar metatarsal head. Still in the same condition callus nonviable subcutaneous tissue which cleans up nicely with debridement but reforms by the next week. The patient tells me that he is offloading this is much as he can including taking his wheelchair to dialysis. We have been using silver alginate, changed to silver collagen today 10/22; right second plantar metatarsal head. Wound measures smaller but still thick callus around this wound. Still requiring debridement 11/5 right second plantar metatarsal head. Less callus around the wound circumference but still requiring debridement. There is still some undermining which I cleaned out the wound looks healthy but still considerable punched out depth with a relatively small circumference to the wound there is no palpable bone no purulent drainage 11/12; right second plantar metatarsal head. Small wound with thick callus tissue around the wound and significant undermining. We have been using silver collagen after my continuous debridements of this area 11/19; patient arrives in today with purulent drainage coming out of the wound in the second third met head area on the right foot. Serosanguineous thick drainage. Specimen obtained for culture. 11/21/2019 on evaluation today patient appears to be doing well with regard to his foot ulcer. The  good news is is culture came back negative for any bacteria there was no growth. The other good news is his x-ray also appears to be doing well. The foot is also measuring much better than what it was. Overall I am very pleased with how things seem to be progressing. 12/22; patient was in [facility] for several weeks due to the death of a family member. He said he stayed off the wound using silver alginate. 01/03/2020. Patient has a small open area with roughly 0.3 cm of circumferential undermining. The orifice looks smaller. We have been using silver collagen. There is not a wound more aggressive way to offload this area as he has a prosthesis on the other leg 1/21; again the same small area is 2 weeks ago. We have been using silver collagen I change that to endoform today I really believe that this is an offloading issue 2/4; the area on the right third met head. We have been using endoform. 2/11 the area on the right third met head we have been using silver alginate. 2/25; the area on the right third met head. There is much less callus on this today. The patient states he is offloading this even more aggressively. As an example he is taking his wheelchair into dialysis on most days 3/4; right third metatarsal head. Again he has eschar over the surface of the wound which I removed with a #3 curette. Some subcutaneous debris. This has 0.8 cm of direct probing depth. I cannot feel any bone here. I did do a  culture the area 3/11; right third metatarsal head. Again an eschared area over the wound which almost makes this look superficial. Again debridement reveals a wound with probing depth probably not much different from last week there is no palpable bone no palpable purulence. The patient tells me that he is making every effort to offload this area properly. He is taking wheelchair into dialysis etc. There is no option for forefoot offloading or a total contact cast. We used Oasis #1  today 3/18; again callus and eschar over the wound surface. I remove this but still a probing wound although only 3 mm in depth this time. This is an improvement 3/25; again callus and eschar over the wound surface which I removed the wound is much deeper today at 0.7 cm. There is no palpable bone. Because of the appearance of this a culture was done. I am not going to put the Oasis back in this. Concerned about underlying infection. Notable for the fact that he is just finished doxycycline for the last go round of this 3/30; still 0.7 cm in depth which is disappointing. Culture I gave last time showed a few Staph aureus which is methicillin susceptible. This should have been taken care of by the recent course of doxycycline I gave him. We are using silver alginate 4/6; almost 1 cm in depth. We have been using silver alginate with underlying Bactroban. 4/15; about 1 cm in depth still. There is no palpable bone but there is undermining thick surface again as usual. Our intake nurse noted what looked to be purulent drainage I suppose that could have been Bactroban but I did a culture for CandS 4/20; depth down 0.7 cm. Culture from last time was negative we have been using Bactroban and silver alginate 4/29; depth down 0.5 cm. Using silver alginate. He reports some drainage 5/6; comes in with the wound worse. Wound is deeper and tunnels. Measures 0.9 depth and an undermining area of 1.8 cm. 5/18; there is a change the dressing last week. Still using Sorbact hydrogel. What I can see of the base of the wound looks better. Objective Constitutional Sitting or standing Blood Pressure is within target range for patient.. Pulse regular and within target range for patient.. Temperature is normal and within the target range for the patient.. Vitals Time Taken: 9:26 AM, Height: 73 in, Weight: 202 lbs, BMI: 26.6, Temperature: 98.5 F, Pulse: 87 bpm, Respiratory Rate: 10 breaths/min, Blood Pressure: 109/58  mmHg, Capillary Blood Glucose: 122 mg/dl. General Notes: Wound exam; small punched-out hole over the right second metatarsal head. This has considerable depth raised callus subcutaneous tissue. At roughly 10:00 2 cm of maximal tunneling which goes from roughly 9-12 o'clock. There is no palpable bone. What I can see of the base of the wound looks a lot healthier. ooI used a #5 curette to remove layer after layer of callus skin and subcutaneous tissue. Integumentary (Hair, Skin) Wound #41 status is Open. Original cause of wound was Gradually Appeared. The wound is located on the Right Metatarsal head second. The wound measures 0.4cm length x 0.5cm width x 1.2cm depth; 0.157cm^2 area and 0.188cm^3 volume. There is Fat Layer (Subcutaneous Tissue) Exposed exposed. There is no undermining noted, however, there is tunneling at 6:00 with a maximum distance of 1.5cm. There is a medium amount of serosanguineous drainage noted. The wound margin is well defined and not attached to the wound base. There is large (67-100%) pink, pale granulation within the wound bed. There is no  necrotic tissue within the wound bed. 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 There was a Excisional Skin/Subcutaneous Tissue Debridement with a total area of 0.2 sq cm performed by Ricard Dillon., MD. With the following instrument(s): Curette to remove Non-Viable tissue/material. Material removed includes Callus, Subcutaneous Tissue, Slough, Skin: Dermis, and Skin: Epidermis after achieving pain control using  Other (benzocaine). No specimens were taken. A time out was conducted at 09:51, prior to the start of the procedure. A Moderate amount of bleeding was controlled with Pressure. The procedure was tolerated well with a pain level of 0 throughout and a pain level of 0 following the procedure. Post Debridement Measurements: 0.6cm length x 0.6cm width x 1.2cm depth; 0.339cm^3 volume. Character of Wound/Ulcer Post Debridement is improved. Severity of Tissue Post Debridement is: Fat layer exposed. Post procedure Diagnosis Wound #41: Same as Pre-Procedure Plan Follow-up Appointments: Return Appointment in 2 weeks. Nurse Visit: - 1 week Dressing Change Frequency: Wound #41 Right Metatarsal head second: Do not change entire dressing for one week. Skin Barriers/Peri-Wound Care: Moisturizing lotion - right leg. Wound Cleansing: May shower with protection. - patient to use a cast protector. Primary Wound Dressing: Wound #41 Right Metatarsal head second: Cutimed Sorbact - moisten with hydrogel. pack into tunnel and undermining. Secondary Dressing: Wound #41 Right Metatarsal head second: Dry Gauze Other: - felt to offload periwound. Edema Control: Kerlix and Coban - Right Lower Extremity Avoid standing for long periods of time Elevate legs to the level of the heart or above for 30 minutes daily and/or when sitting, a frequency of: - throughout the day. Additional Orders / Instructions: Other: - minimize walking and standing on foot to aid in wound healing and callus buildup. 1. I continued with Sorbact packing which seems to help the surface of this wound 2. Aggressive debridement of the circumference of this wound going through callus and layer after layer of subcutaneous tissue 3. I am going to run Dermagraft through his insurance 4. Endoform in 2 weeks. Back for a nurse visit next week Electronic Signature(s) Signed: 05/15/2020 12:52:55 PM By: Linton Ham MD Entered By: Linton Ham on  05/13/2020 10:02:56 -------------------------------------------------------------------------------- SuperBill Details Patient Name: Date of Service: Kateri Plummer 05/13/2020 Medical Record Number: [id number] Patient Account Number: [id number] Date of Birth/Sex: Treating RN: November 07, 1951 (68 y.o. 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, other extremity L03.115 Cellulitis of right lower limb Facility Procedures The patient participates with Medicare or their insurance follows the Medicare Facility Guidelines: CPT4 Code Description Modifier Quantity [id number] 11042 - DEB SUBQ TISSUE 20 SQ CM/< 1 ICD-10 Diagnosis Description L97.512 Non-pressure chronic ulcer of  other part of right foot with fat layer exposed E11.621 Type 2 diabetes mellitus with foot ulcer Physician Procedures : CPT4 Code Description Modifier [id number] - WC PHYS SUBQ TISS 20 SQ CM  ICD-10 Diagnosis Description L97.512 Non-pressure chronic ulcer of other part of right foot with fat layer exposed E11.621 Type 2 diabetes mellitus with foot ulcer Quantity: 1 Electronic Signature(s) Signed: 05/15/2020 12:52:55 PM By: Linton Ham MD Entered By: Linton Ham on 05/13/2020 10:03:24
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
## 2020-05-20 ENCOUNTER — Encounter (HOSPITAL_BASED_OUTPATIENT_CLINIC_OR_DEPARTMENT_OTHER): Payer: Medicare Other | Admitting: Internal Medicine

## 2020-05-20 DIAGNOSIS — E11621 Type 2 diabetes mellitus with foot ulcer: Secondary | ICD-10-CM | POA: Diagnosis not present

## 2020-05-22 NOTE — Progress Notes (Signed)
TAVARAS, GOODY ([id number]) Visit Report for 05/20/2020 SuperBill Details Patient Name: Date of Service: [name], [name] 05/20/2020 Medical Record Number: [id number] Patient Account Number: [id number] Date of Birth/Sex: Treating RN: Aug 24, 1951 (70 y.o. 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

## 2020-05-27 ENCOUNTER — Encounter (HOSPITAL_BASED_OUTPATIENT_CLINIC_OR_DEPARTMENT_OTHER): Payer: Medicare Other | Attending: Internal Medicine | Admitting: Internal Medicine

## 2020-05-27 ENCOUNTER — Other Ambulatory Visit (HOSPITAL_COMMUNITY)
Admission: RE | Admit: 2020-05-27 | Discharge: 2020-05-27 | Disposition: A | Discharge status: Home / Self Care | Payer: Medicare Other | Source: Other Acute Inpatient Hospital | Attending: Internal Medicine | Admitting: Internal Medicine

## 2020-05-27 DIAGNOSIS — I70298 Other atherosclerosis of native arteries of extremities, other extremity: Secondary | ICD-10-CM | POA: Insufficient documentation

## 2020-05-27 DIAGNOSIS — S61209D Unspecified open wound of unspecified finger without damage to nail, subsequent encounter: Secondary | ICD-10-CM | POA: Diagnosis not present

## 2020-05-27 DIAGNOSIS — X58XXXD Exposure to other specified factors, subsequent encounter: Secondary | ICD-10-CM | POA: Insufficient documentation

## 2020-05-27 DIAGNOSIS — E1140 Type 2 diabetes mellitus with diabetic neuropathy, unspecified: Secondary | ICD-10-CM | POA: Insufficient documentation

## 2020-05-27 DIAGNOSIS — E1122 Type 2 diabetes mellitus with diabetic chronic kidney disease: Secondary | ICD-10-CM | POA: Insufficient documentation

## 2020-05-27 DIAGNOSIS — N189 Chronic kidney disease, unspecified: Secondary | ICD-10-CM | POA: Diagnosis not present

## 2020-05-27 DIAGNOSIS — E11621 Type 2 diabetes mellitus with foot ulcer: Secondary | ICD-10-CM | POA: Insufficient documentation

## 2020-05-27 DIAGNOSIS — L03115 Cellulitis of right lower limb: Secondary | ICD-10-CM | POA: Diagnosis not present

## 2020-05-27 DIAGNOSIS — Z79899 Other long term (current) drug therapy: Secondary | ICD-10-CM | POA: Insufficient documentation

## 2020-05-27 DIAGNOSIS — L97512 Non-pressure chronic ulcer of other part of right foot with fat layer exposed: Secondary | ICD-10-CM | POA: Diagnosis not present

## 2020-05-27 DIAGNOSIS — Z992 Dependence on renal dialysis: Secondary | ICD-10-CM | POA: Diagnosis not present

## 2020-05-27 DIAGNOSIS — I132 Hypertensive heart and chronic kidney disease with heart failure and with stage 5 chronic kidney disease, or end stage renal disease: Secondary | ICD-10-CM | POA: Diagnosis not present

## 2020-05-27 DIAGNOSIS — I509 Heart failure, unspecified: Secondary | ICD-10-CM | POA: Diagnosis not present

## 2020-05-27 DIAGNOSIS — L03114 Cellulitis of left upper limb: Secondary | ICD-10-CM | POA: Diagnosis not present

## 2020-05-28 NOTE — Progress Notes (Signed)
KWESI, SANGHA ([id number]) Visit Report for 05/27/2020 Debridement Details Patient Name: Date of Service: [name], [name] 05/27/2020 10:30 A M Medical Record Number: [id number] Patient Account Number: [id number] Date of Birth/Sex: Treating RN: 1951/04/17 (69 y.o. [name]) Carlene Coria Primary Care Provider: [name] Other Clinician: Referring Provider: Treating Provider/Extender: Tanna Savoy in Treatment: 73 Debridement Performed for Assessment: Wound #41 Right Metatarsal head second Performed By: Physician Ricard Dillon., MD Debridement Type: Debridement Severity of Tissue Pre Debridement: Fat layer exposed Level of Consciousness (Pre-procedure): Awake and Alert Pre-procedure Verification/Time Out Yes - 11:58 Taken: Start Time: 11:58 Pain Control: Lidocaine 5% topical ointment T Area Debrided (L x W): otal 0.3 (cm) x 0.4 (cm) = 0.12 (cm) Tissue and other material debrided: Viable, Non-Viable, Slough, Subcutaneous, Skin: Dermis , Skin: Epidermis, Slough Level: Skin/Subcutaneous Tissue Debridement Description: Excisional Instrument: Curette Specimen: Tissue Culture Number of Specimens T aken: 1 Bleeding: Minimum Hemostasis Achieved: Pressure End Time: 12:02 Procedural Pain: 0 Post Procedural Pain: 0 Response to Treatment: Procedure was tolerated well Level of Consciousness (Post- Awake and Alert procedure): Post Debridement Measurements of Total Wound Length: (cm) 0.3 Width: (cm) 0.4 Depth: (cm) 1.1 Volume: (cm) 0.104 Character of Wound/Ulcer Post Debridement: Improved Severity of Tissue Post Debridement: Fat layer exposed Post Procedure Diagnosis Same as Pre-procedure Electronic Signature(s) Signed: 05/27/2020 5:52:05 PM By: Linton Ham MD Signed: 05/28/2020 4:36:43 PM By: Carlene Coria RN Entered By: Linton Ham on 05/27/2020 12:57:49 -------------------------------------------------------------------------------- HPI  Details Patient Name: Date of Service: [name] Shines B. 05/27/2020 10:30 A M Medical Record Number: [id number] Patient Account Number: [id number] Date of Birth/Sex: Treating RN: August 19, 1951 (69 y.o. [name] Primary Care Provider: [name] Other Clinician: Referring Provider: Treating Provider/Extender: Tanna Savoy in Treatment: 50 History of Present Illness Location: Patient presents with a wound to bilateral feet. Quality: Patient reports experiencing essentially no pain. Severity: Mildly severe wound with no evidence of infection Duration: Patient has had the wound for greater than 2 weeks prior to presenting for treatment HPI Description: The patient is a pleasant 69 yrs old bm here for evaluation of ulcers on the plantar aspect of both feet. 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 He has a left arm graft for access. He has been trying to stay off his feet for weeks but does not seem to have any improvement in the wound. He has been seeing someone at the foot center and was referred to the Wound Care center for further evaluation. 12/30/15 the patient has 3 wounds one over the right first metatarsal head and 2 on the left foot at the left fifth and lleft first metatarsal head. All of these look relatively similar. The one over the left fifth probe to bone I could not prove that any of the others did. I note his MRI in November that did not show osteomyelitis. His peripheral pulses seem robust. All of these underwent surgical debridement to remove callus nonviable skin and subcutaneous tissue 01/06/16; the patient had his sutures removed from the right fifth ray amputation. There may be a small open part of this superiorly but otherwise the incision looks good. Areas over his right first, left first and left fifth metatarsal head all underwent surgical debridement as a varying degree of callus, skin  and nonviable subcutaneous tissue. The area that is most worrisome is the right fifth metatarsal head which has a wound probes precariously close  to bone. There is no purulent drainage or erythema 01/13/16; I'm not exactly sure of the status of the right fifth ray amputation site however he follows with Dr. [name] later this week. The area over his right first plantar metatarsal head, left first and fifth plantar metatarsal head are all in the same status. Thick circumferential callus, nonviable subcutaneous tissue. Culture of the left fifth did not culture last week 01/20/16; all of the patient's wounds appear and roughly the same state although his amputation site on the right lateral foot looks better. His wounds over the right first, left first and left fifth metatarsal heads all underwent difficult surgical debridement removing circumferential callus nonviable skin and subcutaneous tissue. There is no overt evidence of infection in these areas. MRI at the end of December of the left foot did not show osteomyelitis, right foot showed osteomyelitis of the right fifth digit he is now status post amputation. 01/27/16 the patient's wounds over his plantar first and fifth metatarsal heads on the left all appear better having been started on a total contact cast last week. They were debridement of circumferential callus and nonviable subcutaneous tissue as was the wound over the first metatarsal head. His surgical incision on the right also had some light surface debridement done. 03/23/2016 -- the patient was doing really well and most of his wounds had almost completely healed but now he came back today with a history of having a discharge from the area of his right foot on the plantar aspect and also between his first and second toe. He also has had some discharge from the left foot. Addendum: I spoke to the PA [name] at the dialysis center whose fax number is [phone]. We discussed the  infection the patient has and she will put the patient on vancomycin and Fortaz until the final culture report is back. We will fax this as soon as available. 03/30/2016 -- left foot x-ray IMPRESSION:No definitive osteomyelitis noted. X-ray of the right foot -- IMPRESSION: 1. Soft tissue swelling. Prior amputation right fifth digit. No acute or focal bony abnormality identified. If osteomyelitis remains a clinical concern, MRI can be obtained. 2. Peripheral vascular disease. His culture reports have grown an MSSA -- and we will fax this report to his hemodialysis center. He was called in a prescription of oral doxycycline but I have told her not to fill this in as he is already on IV antibiotics. 04/06/2016 -- a few days ago, I spoke to the hemodialysis center nurse who had stopped the IV antibiotics and he was given a prescription for doxycycline 100 mg by mouth twice a day for a week and he is on this at the present time. 05/11/2016 -- he has recently seen his PCP this week and his hemoglobin A1c was 7. He is working on his paperwork to get his orthotic shoes. 05/25/2016 -- -- x-ray of the left foot IMPRESSION:No acute bony abnormality. No radiographic changes of acute osteomyelitis. No change since prior study. X-ray of the right foot -- IMPRESSION: Postsurgical changes are seen involving the fifth toe. No evidence of acute osteomyelitis. he developed a large blister on the medial part of his right foot and this opened out and drain fluid. 06/01/2016 -- he has his MRI to be done this afternoon. 06/15/2016 -- MRI of the left forefoot without contrast shows 2 separate regions of cutaneous and subcutaneous edema and possible ulceration and blistering along the ball of the foot. No obvious osteomyelitis identified. MRI  of the right foot showed cutaneous and subcutaneous thickening plantar to the first digit sesamoid with an ulcer crater but no underlying osteomyelitis is identified. 06/22/2016 --  the right foot plantar ulcer has been draining a lot of seropurulent material for the last few days. 06/30/2016 -- spoke to the dialysis center and I believe I spoke to Compo a PA at the center who discussed with me and agreed to putting [name] on vancomycin until his cultures arrive. On review of his culture report no WBCs were seen or no organisms were seen and the culture was reincubated for better growth. The final report is back and there were no predominant growth including Streptococcus or Staphylococcus. Clinically though he has a lot of drainage from both wounds a lot of undermining and there is further blebs on the left foot towards the interspace between his first and second toe. 08/10/2016 -- the culture from the right foot showed normal skin flora and there was no Staphylococcus aureus was group A streptococcus isolated. 09/21/2016 -- -- MRI of the right foot was done on 09/13/2016 - IMPRESSION: Findings most consistent with acute osteomyelitis throughout the great toe, sesamoid bones and plantar aspect of the head of the first metatarsal. Fluid in the sheath of the flexor tendon of the great toe could be sympathetic but is worrisome for septic tenosynovitis. First MTP joint effusion worrisome for septic joint. He was admitted to the hospital on 09/11/2016 and treated for a fever with vancomycin and cefepime. He was seen by Dr. Bobby Rumpf of infectious disease who recommended 6 weeks treatment with vancomycin and ceftazidime with his hemodialysis and to continue to see as in the wound clinic. Vascular consult was pending. The patient was discharged home on 09/14/2016 and he would continue with IV antibiotics for 6 weeks. 09/28/16 wound appears reasonably healthy. Continuing with total contact cast 10/05/16 wound is smaller and looking healthy. Continue with total contact cast. He continues on IV antibiotics 10/12/2016 -- he has developed a new wound on the dorsal aspect of his  left big toe and this is a superficial injury with no surrounding cellulitis. He has completed 30 days of IV antibiotics and is now ready to start his hyperbaric oxygen therapy as per his insurance company's recommendation. 10/19/2016 -- after the cast was removed on the right side he has got good resolution of his ulceration on the right plantar foot. he has a new wound on the left plantar foot in the region of his fourth metatarsal and this will need sharp debridement. 10/26/2016 -- Xray of the right foot complete: IMPRESSION: Changes consistent with osteomyelitis involving the head of the first metatarsal and base of the first proximal phalanx. The sesamoid bones are also likely involved given their positioning. 11/02/2016 -- there still awaiting insurance clearance for his hyperbaric oxygen therapy and hopefully he will begin treatment soon. 11/09/2016 -- started with hyperbaric oxygen therapy and had some barotrauma to the right ear and this was seen by ENT who was prescribed Afrin drops and would probably continue with HBO and he is scheduled for myringotomy tubes on Friday 11/16/2016 -- he had his myringotomy tubes placed on Friday and has been doing much better after that with some fluid draining out after hyperbaric treatment today. The pain was much better. 11/30/2016 -- over the last 2 days he noticed a swelling and change of color of his right second toe and this had been draining minimal fluid. 12/07/2016 -- x-ray of the right foot -- IMPRESSION:1.  Progressive ulceration at the distal aspect of the second digit with significant soft tissue swelling and osseous changes in the distal phalanx compatible with osteomyelitis. 2. Chronic osteomyelitis at the first MTP joint. 3. Ulcerations at the second and third toes as well without definite osseous changes. Osteomyelitis is not excluded. 4. Fifth digit amputation. On 12/01/2016 I spoke to Dr. Bobby Rumpf, the infectious disease  specialist, who kindly agreed to treat this with IV antibiotics and he would call in the order to the dialysis center and this has been discussed in detail with the patient who will make the appropriate arrangements. The patient will also book an appointment as soon as possible to see Dr. [name] in the office. Of note the patient has not been on antibiotics this entire week as the dialysis center did not receive any orders from Dr. [name]. I got in touch with Dr. [name] who tells me the patient has an appointment to see him this coming Wednesday. 12/14/2016 -- the patient is on ceftazidime and vancomycin during his dialysis, and I understand this was put on by his nephrologist Dr. Raliegh Ip possibly after speaking with infectious disease Dr. [name] 12/23/16; patient was on my schedule today for a wound evaluation as he had difficulties with his schedule earlier this week. I note that he is on vancomycin and ceftazidine at dialysis. In spite of this he arrives today with a new wound on the base of the right second toe this easily probes to bone. The known wound at the tip of the second toe With this area. He also has a superficial area on the medial aspect of the third toe on the side of the DIP. This does not appear to have much depth. The area on the plantar left foot is a deep area but did not probe the bone 12/28/16 -- he has brought in some lab work and the most recent labs done showed a hemoglobin of 12.1 hematocrit of 36.3, neutrophils of 51% WBC count of 5.6, BN of 53, albumin of 4.1 globulin of 3.7, vancomycin 13 g per mL. 01/04/2017 -- he saw Dr. [name] who has recommended a amputation of the right second toe and he is awaiting this date. He sees Dr. Bobby Rumpf of infectious disease tomorrow. The bone culture taken on 12/28/2016 had no growth in 2 days. 01/11/2017 -- was seen by Dr. Bobby Rumpf regarding the management and has recommended a eval by vascular surgeons. He recommended to  continue the antibiotics during his hemodialysis. Xray of the left foot -- IMPRESSION: No acute fracture, dislocation, or osseous erosion identified. 01/18/2017 -- he has his vascular workup later today and he is going to have his right foot second toe amputation this coming Friday by Dr. [name]. We have also put in for a another 30 treatments with hyperbaric oxygen therapy 01/25/2017 -- I have reviewed Dr. Donnetta Hutching is vascular report from last week where he reviewed him and thought his vascular function was good enough to heal his amputation site and no further tests were recommended. He also had his orthopedic related to surgery which is still pending the notes and we will review these next week. 02/01/2017 -- the operative remote of Dr. 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 02/08/2017 --he was seen by Dr. [name] on 02/03/2017 -- patient is on Alpha. after a thorough review he had recommended to continue with antibiotics during hemodialysis. The patient was seen by Dr. [name], but I do not find any follow-up note on the electronic medical record. he has removed the dressing over the right foot to remove the sutures and asked him to see him back in a month's time 02/22/2017 -- patient has been febrile and has been having symptoms of the upper respiratory tract infection but has not been checked for the flu and it's been over 4 days now. He says he is feeling better today. Some drainage between his left first and second toe and this needed to be looked at 03/08/2017 -- was seen by Dr. Bobby Rumpf on 03/07/2017 after review he  will stop his antibiotics and see him back in a month to see if he is feeling well. 03/15/2017 -- he has completed his course of hyperbaric oxygen therapy and is doing fine with his health otherwise. 03/22/2017 -- his nutritionist at the dialysis center has recommended a protein supplement to help build his collagen and we will prescribe this for him when he has the details 05/03/2017 -- he recently noticed the area on the plantar aspect of his fifth metatarsal head which opened out and had minimal drainage. 05/10/2017 -- -- x-ray of the left foot -- IMPRESSION:1. No convincing conventional radiographic evidence of active osteomyelitis.2. Active soft tissue ulceration at the tip of the fifth digit, and at the lateral aspect of the foot adjacent the base of the fifth metatarsal. 3. Surgical changes of prior fourth toe amputation. 4. 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 5. Small vessel atherosclerotic vascular calcifications. 6. 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 ======= Readmission after 5 weeks: 06/15/2017 -- the patient returns after 5 weeks having had an MRI done on 05/17/2017 MRI of the left foot without contrast showed soft tissue ulcer overlying the base of the fifth metatarsal. 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 No drainable fluid collection to suggest an abscess. He was in hospital between 05/16/2017 and 05/25/2017 -- and on discharge was asked to follow-up with Dr. [name] and Dr. [name]. He was treated 2 weeks post discharge with vancomycin plus ceftriaxone with hemodialysis and oral metronidazole 500 mg 3 times a day. 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 last hemoglobin A1c was 6.7% He had a postoperative MR of the foot on 05/23/2017 -- which showed no new areas  of cortical bone loss, edema or soft tissue ulceration to suggest osteomyelitis. the patient has completed his course of IV antibiotics during dialysis and is to see the infectious disease doctor tomorrow. Dr. [name] had seen him after suture removal and asked him to keep the wound with a dry dressing 06/21/2017 -- he was seen by Dr. Bobby Rumpf of infectious disease on 06/16/2017 -- he stopped his Flagyl and will see him back in 6 weeks. He was asked to follow-up with Dr. [name] and with the wound clinic clinic. 07/05/17; bone biopsy from last time showed acute osteomyelitis. This is from the reminiscent in the left fifth metatarsal. Culture result is apparently still pending [holding for anaerobe}. From my understanding in this case this is  been a progressive necrotic wound which is deteriorated markedly over the last 3 weeks since he returned here. He now has a large area of exposed bone which was biopsied and cultured last week. Dr. Graylon Good has put him on vancomycin and Fortaz during his hemodialysis and Flagyl orally. He is to see Dr. [name] next week 07/19/2017 -- the patient was reviewed by Dr. [name] of orthopedics who reviewed the case in detail and agreed with the plan to continue with IV antibiotics, aggressive wound care and hyperbaric oxygen therapy. He would see him back in 3 weeks' time 08/09/2017 -- saw Dr. Bobby Rumpf on 08/03/2017 -he was restarted on his antibiotics for 6 weeks which included vanco, ceftaz and Flagyl. he recommended continuing his antibiotics for 6 weeks and reevaluate his completion date. He would continue with wound care and hyperbaric oxygen therapy. 08/16/2017 -- I understand he will be completing his 6 weeks of antibiotics sometime later this week. 09/19/2017 -- he has been without his wound VAC for the last week due to lack of supplies. He has been packing his wound with silver alginate. 10/04/2017 -- he has an appointment with Dr. [name] tomorrow and  hence we will not apply the wound VAC after his dressing changes today. 10/11/2017 -- he was seen by Dr. Bobby Rumpf on 10/05/2017, noted that the patient was currently off antibiotics, and after thorough review he recommended he follow-up with the wound care as per plan and no further antibiotics at this point. 11/29/17; patient is arrived for the wound on his left lateral foot.Marland Kitchen He is completed IV antibiotics and 2 rounds of hyperbaric oxygen for treatment of underlying osteomyelitis. He arrives today with a surface on most of the wound area however our intake nurse noted drainage from the superior aspect. This was brought to my attention. 12/13/2017 -- the right plantar foot had a large bleb and once the bleb was opened out a large callus and subcutaneous debris was removed and he has a plantar ulcer near the fourth metatarsal head 12/28/17 on evaluation today patient appears to be doing acutely worse in regard to his left foot. The wound which has been appearing to do better as now open up more deeply there is bone palpable at the base of the wound unfortunately. He tells me that this feels "like it did when he had osteomyelitis previously" he also noted that his second toe on the left foot appears to be doing worse and is swollen there does appear to be some fluid collected underneath. His right foot plantar ulcer appears to be doing somewhat better at this point and there really is no complication at the site currently. No fevers, chills, nausea, or vomiting noted at this time. Patient states that he normally has no pain at this site however. T oday he is having significant pain. 01/03/18; culture done last week showed methicillin sensitive staph aureus and group B strep. He was on Septra however he arrives today with a fever of 101. He has a functional dialysis shunt in his left arm but has had no pain here. No cough. He still makes some urine no dysuria he did have abdominal pain nausea and  diarrhea over the weekend but is not had any diarrhea since yesterday. Otherwise he has no specific complaints 01/05/18; the patient returns today in follow-up for his presentation from 1/8. At that point he was febrile. I gave him some Levaquin adjusted for his dialysis status. He tells me the fever broke that night  and it is not likely that this was actually a wound infection. He is gone on to have an MRI of the left foot; this showed interval development of an abnormal signal from the base of the third and fourth metatarsal and in the cuboid reminiscent consistent with osteomyelitis. There is no mention of the left second toe I think which was a concern when it was ordered. The patient is taking his Levaquin 01/10/18; the patient has completed his antibiotics today. The area on the left lateral foot is smaller but it still probes easily to bone. He has underlying osteomyelitis here and sees Dr. Megan Salon of infectious disease this week The area on the right third plantar metatarsal head still requires debridement not much change in dimensions He has a new wound on the medial tip of his right third toe again this probes to bone. He only noticed this 2 days ago 01/17/18; the patient saw Dr. [name] last week and he is back on Vanco and Fortaz at dialysis. 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 We are previously seeing him for a wound also on the base of the fourth med head on the right. X-ray of this area did not show osteomyelitis in relation to a new wound on the tip of his right third toe. Again this probes to bone. Finally his second toe on the left which didn't really show anything on the MRI at least no report appears to have a separated cutaneous area which I think is going to come right off the tip of his toe and leave exposed bone. Whether this is infectious or ischemic I am not  clear Culture I did from the third toe last week which was his new wound was negative. Plain x-ray on the right foot did not show osteomyelitis in the toes 01/24/18; the patient is going went to see Dr. [name]. He is going to have a amputation of the left second and right third toe although paradoxically the left second toe looks better than last week. We have treating a deep probing wound on the left lateral foot and the area over the third metatarsal head on the right foot. 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 Also a debridement including bone of the left lateral foot and a closure which is still sutured. This is a bit surprising. Also apparent debridement of the base of the right third toe wound. 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 I'm not sure exactly where we are in the frame of things here. The wound on the left lateral foot is still sutured also the amputation sites of the left second and right third toe. He is been using Xeroform to the sutured areas silver alginate to the right foot 02/14/18; he continues on Rocephin at dialysis for osteomyelitis. I still don't have a good sense of where we are in the treatment duration. The wounds on the left lateral foot had the sutures removed and this clearly still probes deeply. We debrided the area with a #5 curet. We'll use silver alginate to the wound the area over the right third met head also required debridement  of callus skin and subcutaneous tissue and necrotic debris over the wound surface. This tunnels superiorly but I did not unroofed this today. The areas on the tip of his toes have a crusted surface eschar I did not debridement this either. 02/21/18; he continues on Rocephin at dialysis for  osteomyelitis. I don't have a good sense of where we are and the treatment duration. The wounds on the left lateral foot is callused over on presentation but requires debridement. The area on the right third med head plantar aspect actually is measuring smaller 02/28/18;patient is on Vanco and Fortaz at dialysis, I'm not sure why I thought this was Rocephin above unless it is been changed. I don't have a good sense of time frame here. Using silver collagen to the area on the left lateral foot and right third med head plantar foot 03/07/18; patient is completing bank and Fortaz at dialysis soon. He is using Silver collagen to the area on the left lateral foot and the right third metatarsal head. He has a smaller superficial wound distal to the left lateral foot wound. 03/14/18-he is here in follow-up evaluation for multiple ulcerations to his bilateral feet. He presents with a new ulceration to the plantar aspect of the left foot underneath the blister, this was deroofed to reveal a partial thickness ulcer. He is voicing no complaints or concerns, tolerated dialysis yesterday. We will continue with same treatment plan and he will follow-up next week 03/21/18; the patient has a area on the lateral left foot which still has a small probing area. The overall surface area of the wound is better. He presented with a new ulceration on the left foot plantar fifth metatarsal head last week. The area on the right third metatarsal head appears smaller.using silver alginate to all wound areas. The patient is changing the dressing himself. He is using a [place] forefoot offloading are on the right and a healing sandal on the left 03/28/18; original wound left lateral foot. Still nowhere close to looking like a heeling surface secondary wound on the left lateral foot at the base of his question fifth metatarsal which was new last week Right third metatarsal head. We have been using silver alginate all wounds 04/04/18; the  original wound on the left lateral foot again heavy callus surrounding thick nonviable subcutaneous tissue all requiring debridement. The new wound from 2 weeks ago just near this at the base of the fifth metatarsal head still looks about the same Unfortunately there is been deterioration in the third medical head wound on the right which now probes to bone. I must say this was a superficial wound at one point in time and I really don't have a good frame of reference year. I'll have to go back to look through records about what we know about the right foot. 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 He is also been to see Dr. [name]. Somewhere in here as somebody is ordered a VASCULAR evaluation which was done on 03/22/18. On the right is anterior tibial artery was monophasic triphasic at the posterior tibial artery. On the left anterior tibial artery monophasic posterior tibial artery monophasic. ABI in the right was 1.43 on the left 1.21. TBIs on the right at 0.41 and on the left at 0.32. A vascular consult was recommended and I think has been arranged. 04/11/18; Mr. Tabb has not had an MRI of the right  foot since 2017. Recent x-ray of the right foot done in January and February was negative. However he has had a major deterioration in the wound over the third met head. He is completed antibiotics last week at dialysis [place]. I think he is going to see vascular in follow-up. The areas on the left lateral foot and left plantar fifth metatarsal head both look satisfactory. I debrided both of these areas although the tissue here looks good. The area on the left lateral foot once probe to bone it certainly does not do that now 04/18/18; MRI of the right foot is on Thursday. He has what looks to be serosanguineous purulent drainage coming out of the wound over the right third metatarsal head today.  He completed antibiotics bank and Fortaz 2 weeks ago at dialysis. He has a vascular follow-up with regards to his arterial insufficiency although I don't exactly see when that is booked. The areas on the left foot including the lateral left foot and the plantar left fifth metatarsal head look about the same. 04/25/18-He is here in follow-up evaluation for bilateral foot ulcers. MRI obtained was negative for osteomyelitis. Wound culture was negative. We will continue with same treatment plan he'll follow-up next week 05/02/18; the right plantar foot wound over the third metatarsal head actually looks better than when I last saw this. His MRI was negative for osteomyelitis wound culture was negative. On the left plantar foot both wounds on the plantar fifth metatarsal head and on the lateral foot both are covered in a very hard circumferential callus. 05/09/18; right plantar foot wound over the third metatarsal head stable from last week. Left plantar foot wound over the fifth metatarsal head also stable but with callus around both wound areas The area on the left lateral foot had thick callus over a surface and I had some thoughts about leaving this intact however it felt boggy. We've been using silver alginate all wounds 05/16/18; since the patient was last here he was hospitalized from 5/15 through 5/19. 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 This grew MRSA. He was placed on vancomycin.MRI of the left foot suggested "progressive" osteomyelitis and bone destruction of the cuboid and the base of the fifth metatarsal. Stable erosive changes of the base of the second metatarsal. I'm wondering if they're aware that he had surgery and debridement in the area of the underlying bone previously by Dr. [name]. In any case, He was also revascularized with an angioplasty and stenting of the left tibial  peroneal trunk and angioplasty of the left posterior tibial artery. He is on vancomycin at dialysis. He has a 2 week follow-up with infectious disease. He has vascular surgery follow-up. He was started on Plavix. 05/23/18; the patient's wound on the lateral left foot at the level of the fifth metatarsal head and the plantar wound on the plantar fifth metatarsal head both look better. The area on the right third metatarsal head still has depth and undermining. We've been using silver alginate. He is on vancomycin. He has been revascularized on the left 05/30/18; he continues on vancomycin at dialysis. Revascularized on the left. The area on the left lateral foot is just about closed. Unfortunately the area over the plantar fifth metatarsal head undermining superiorly as does the area over the right third metatarsal head. Both of these significantly deteriorated from last week. He has  appointments next week with infectious disease and orthopedics I delayed putting on a cast on the left until those appointments which therefore we made we'll bring him in on Friday the 14th with the idea of a cast on the left foot 06/06/18; he continues on vancomycin at dialysis. Revascularized on the left. He arrives today with a ballotable swelling just above the area on the left lateral foot where his previous wound was. He has no pain but he is reasonably insensate. The difficult areas on the plantar left fifth metatarsal head looks stable whereas the area on the right third plantar metatarsal head about the same as last week there is undermining here although I did not unroofed this today. He required a considerable debridement of the swelling on the left lateral foot area and I unroofed an abscess with a copious amount of brown purulent material which I obtained for culture. I don't believe during his recent hospitalization he had any further imaging although I need to review this. Previous cultures from this area done in  this clinic showed MRSA, I would be surprised if this is not what this is currently even though he is on vancomycin. 06/13/18; he continues on vancomycin at dialysis however he finishes this on Sunday and then graduates the doxycycline previously prescribed by Dr. [name]. The abscess site on the lateral foot that I unroofed last time grew moderate amounts of methicillin-resistant staph aureus that is both vancomycin and tetracycline sensitive so we should be okay from that regard. He arrives today in clinic with a connection between the abscess site and the area on the lateral foot. We've been using silver alginate to all his wound areas. The right third metatarsal head wound is measuring smaller. Using silver alginate on both wound areas 06/20/18; transitioning to doxycycline prescribed by Dr. [name]. The abscess site on the lateral foot that I unroofed 2 weeks ago grew MRSA. That area has largely closed down although the lateral part of his wound on the fifth metatarsal head still probes to bone. I suspect all of this was connected. On the right third metatarsal head smaller looking wound but surrounded by nonviable tissue that once again requires debridement using silver alginate on both areas 06/27/18; on doxycycline 100 twice a day. The abscess on the lateral foot has closed down. Although he still has the wound on the left fifth metatarsal head that extends towards the lateral part of the foot. The most lateral part of this wound probes to bone Right third metatarsal head still a deep probing wound with undermining. Nevertheless I elected not to debridement this this week If everything is copious static next week on the left I'm going to attempt a total contact cast 07/04/18; he is tolerating his doxycycline. The abscess on the left lateral foot is closed down although there is still a deep wound here that probes to bone. We will use silver collagen under the total contact cast Silver collagen to  the deep wound over the right third metatarsal head is well 07/11/18; he is tolerating doxycycline. The left lateral fifth plantar metatarsal head still probes deeply but I could not probe any bone. We've been using silver collagen and this will be the second week under the total contact cast Silver collagen to deep wound over the right third metatarsal head as well 07/18/18; he is still taking doxycycline. The left lateral fifth plantar metatarsal this week probes deeply with a large amount of exposed bone. Quite a deterioration. He required extensive  debridement. Specimens of bone for pathology and CNS obtained Also considerable debridement on the right third metatarsal head 07/25/18; bone for pathology last week from the lateral left foot showed osteomyelitis. CandS of the bone Enterobacter. And Klebsiella. He is now on ciprofloxacin and the doxycycline is stopped [Dr. [name] of infectious disease] area He has an appointment with Dr. [name] tomorrow I'm going to leave the total contact cast off for this week. May wish to try to reapply that next week or the week after depending on the wound bed looks. I'm not sure if there is an operative option here, previously is followed with Dr. [name] 08/01/18 on evaluation today patient presents for reevaluation. He has been seen by infectious disease, Dr. [name], and he has placed the patient back on Ceftaz currently to be given to him at dialysis three times a week. 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 Fortunately there does not appear to be the evidence of infection at this point in time. Overall the patient has been tolerating the dressing changes without complication. We are no longer due lives in the cast of left foot secondary to the infection obviously. 08/10/18 on evaluation today patient appears to be doing rather well all things considered in regard to his left plantar foot  ulcer. He does have a significant callous on the lateral portion of his left foot he wonders if I can help clean that away to some degree today. Fortunately he is having no evidence of infection at this time which is good news. No fevers chills noted. With that being said his right plantar foot actually does have a significant callous buildup around the wound opening this seems to not be doing as well as I would like at this point. I do believe that he would benefit from sharp debridement at this site. Subsequently I think a total contact cast would be helpful for the right foot as well. 08/17/18; the patient arrived today with a total contact cast on the right foot. This actually looks quite good. He had gone to see Dr. Megan Salon of infectious disease about the osteomyelitis on the left foot. I think he is on IV Fortaz at dialysis although I am not exactly sure of the rationale for the Fortaz at the time of this dictation. He also arrived in today with a swelling on the lateral left foot this is the site of his original wounds in fact when I first saw this man when he was under Dr. Ardeen Garland care I think this was the site of where the wound was located. It was not particularly tender however using a small scalpel I opened it to [place] some moderate amount of purulent drainage. 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 Last MRI was in May that showed progressive osteomyelitis and bone destruction of the cuboid and base of the fifth metatarsal. Last week he had an abscess over this same area this was removed. Culture was negative. We are continuing with a total contact cast to the area on the third metatarsal head on the right and making some good progress here. The patient asked me again about renal transplant. He is not on the list because of the open wounds. 08/31/18; apparently  the patient had an interruption in the [place] but he is now  back on this at dialysis. Apparently there was a misunderstanding and Dr. Crissie Figures orders. Due to have the MRI of the left foot tonight. The area on the right foot continues to have callus thick skin and subcutaneous tissue around the wound edge that requires constant debridement however the wound is smaller we are using a total contact cast in this area. Alginate all wounds 09/07/18; without much surprise the MRI of his left foot showed osteomyelitis in the reminiscent of his fourth metatarsal but also the third metatarsal. She arrives in clinic today with the right foot and a total contact cast. There was purulent drainage coming out of the wound which I have cultured. Marked deterioration here with undermining widely around the wound orifice. Using pickups and a scalpel I remove callus and subcutaneous tissue from a substantial new opening. In a similar fashion the area on the left lateral foot that was blistered last week I have opened this and remove skin and subcutaneous tissue from this area to expose a obvious new wound. I think there is extension and communication between all of this on the left foot. The patient is on Fortaz at dialysis. Culture done of the right foot. We are clearly not making progress here. We have made him an appointment with Dr. [name] of orthopedics. I think an amputation of the left leg may be discussed. I don't think there is anything that can be done with foot salvage. 09/14/18; culture from the right foot last time showed a very resistant MRSA. This is resistant to doxycycline. I'm going to try to get linezolid at least for a week. He does not have a appointment with Dr. [name] yet. 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 I'll send a message to Dr. [name] of infectious disease. I want the patient to see Dr. [name] to go over the pros and cons of  an amputation which I think will be a BKA. The patient had a question about whether this is curative or not. I told him that I thought it would be although spread of staph aureus infection is not unheard of. MRI of the right foot was done at the end of April 2019 did not show osteomyelitis. The patient last saw Dr. [name] on 9/3. I'll send Dr. [name] a message. I would really like him to weigh the pros and cons of a BKA on the left otherwise he'll probably need IV antibiotics and perhaps hyperbaric oxygen again. He has not had a good response to this in the past. His MRI earlier this month showed progressive damage in the remnants of the fourth and third metatarsal 09/21/2018; sees Dr. [name] of orthopedics next week to discuss a left BKA in response to the osteomyelitis in the left foot. Using silver alginate to all wound areas. He is completing the Zyvox I did put in for him after last culture showed MRSA 09/29/2018; patient saw Dr. [name] of orthopedics discussed the osteomyelitis in the left foot third and fourth metatarsals. He did not recommend urgent surgery but certainly stated the only surgical option would be a BKA. He is communicating with Dr. [name]. The problem here is the instability of the areas on his foot which constantly generate draining abscesses. I will communicate with Dr. [name] about this. He has an appointment on 10/23 10/05/2018; patient sees Dr. [name] on 10/23. I have sent him a secure message not ordered any additional antibiotics for now. Patient  continues to have 2 open areas on the left plantar foot at the fifth metatarsal head and the lateral aspect of the foot. These have not changed all that much. The area on the right third met head still has thick callus and surrounding subcutaneous tissue we have been using silver alginate 10/12/2018; patient sees Dr. [name] or colleague on 10/23. 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 The area on the right plantar foot is just about closed. The superior wound on the left fifth metatarsal head is callused over but I am not sure if this is closed. The area below it is about the same. Considerable amount of callus on the lateral foot. We have been using silver alginate to the wounds 10/19/2018; the patient saw Dr. [name] on 10/22. He wishes to try and continue to save the left foot. He has been given 6 weeks of oral ciprofloxacin. We continue to put a total contact cast on the right foot. The area over the fifth metatarsal head on the left is callused over/perhaps healed but I did not remove the callus to find out. He still has the wound on the left lateral midfoot requiring debridement. We have been using silver alginate to all wound areas 10/26/2018 On the left foot our intake nurse noted some purulent drainage from the inferior wound [currently the only one that is not callused over] On the right foot even though he is in total contact cast a considerable amount of thick black callus and surface eschar. On this side we have been using silver alginate under a total contact cast. he remains on ciprofloxacin as prescribed by Dr. [name] 11/02/2018; culture from last week which is done of the probing area on the left midfoot wound showed MRSA "few". I am going to need to contact Dr. [name] which I will do today I think he is going to need IV antibiotics again. The area on the left foot which was callused on the side is clearly separating today all of this was removed a copious amounts of callus and necrotic subcutaneous tissue. The area on the right plantar foot actually remained healthy looking with a healthy granulated base. We are using silver alginate to all wound areas 11/09/2018; unfortunately neither 1 of the patient's wounds areas looks at all satisfactory. On the left he has considerable necrotic debris from the plantar wound laterally over the  foot. I removed copious amounts of material including callus skin and subcutaneous tissue. On the right foot he arrives with undermining and frankly purulent drainage. Specimen obtained for culture and debridement of the callused skin and subcutaneous tissue from around the circumference. Because of this I cannot put him back in a total contact cast but to be truthful we are really unfortunately not making a lot of progress I did put in a secure text message to Dr. [name] wondering about the MRSA on the left foot. He suggested vancomycin although this is not started. I was left wondering if he expected me to send this into dialysis. Has we have more purulence on the right side wait for that result before calling dialysis 11/16/2018; I called dialysis earlier this week to get the vancomycin started. I think he got the first dose on Tuesday. The patient tells me that he fell earlier this week twisting his left foot and ankle and he is swelling. He continues to not look well. He had blood cultures done at dialysis  on Tuesday he is not been informed of the results. 12/07/2018; the patient was admitted to hospital from 11/22/2018 through 12/02/2018. He underwent a left BKA. Apparently had staph sepsis. In the meantime being off the right foot this is closed over. He follows up with Dr. 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 Subsequent to this he actually had multiple falls in fact he tells me to following which calls the wound to be his apparently. He has been seeing Dr. [name] and his physician assistant in the interim. They actually did want him to come back for reevaluation to see if there's anything we can do for me wound care perspective the help this area to heal more appropriately. The patient states that he does not have a tremendous amount of pain which is good news. No fevers,  chills, nausea, or vomiting noted at this time. We have gotten approval from Dr. [name] office had [place] for [place] to treat the patient for his stop wound. This is due to the fact that the patient was in the 90 day postop global. 1/21; the patient does not have an open wound on the right foot although he does have some pressure areas that will need to be padded when he is transferring. The dehisced surgical wound looks clean although there is some undermining of 1.5 cm superiorly. 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 This appears healthy but there is undermining. TheraSkin #1 2/11; TheraSkin #2 2/25; TheraSkin #3. Wound is measuring smaller 3/10; TheraSkin #4 wound is measuring smaller 3/24; TheraSkin #5 wound is measuring smaller and looks healthy. 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 He has no open area on the right foot although he does have some callused area over the original third metatarsal head wound. The third metatarsal is also subluxed on the right foot. I have warned him today that he cannot consider wearing a prosthesis for at least a month but he can go for measurements. He is going to need to keep the right foot padded is much as possible in his diabetic shoe indefinitely READMISSION 06/12/2019 [name] is a type II diabetic on dialysis. He has been in this clinic multiple times with wounds on his bilateral lower extremities. 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 He also has a history of wounds on his right foot but tells me that everything is going well here. He actually came in here with his prosthesis walking with a cane. We were all really quite gratified to see this He states over the last several weeks he has had a painful area at the tip of the  left third finger. His dialysis shunt in his is in the left upper arm and this particularly hurts during dialysis when his fingers get numb and there is a lot of pain in the tip of his left third finger. I believe he is seen vascular surgery. He had a Doppler done to evaluate for a dialysis steal syndrome. He is going for a banding procedure 1 week from today towards the left AV fistula. I think if that is unsuccessful they will be looking at creating a new shunt. He  had a course of doxycycline when he which he finished about a week ago. He has been using Bactroban to the finger 6/25; the patient had his banding procedure and things seem to be going better. He is not having pain in his hand at dialysis. The area on the tip of his finger seems about closed. He did however have a paronychia I the last time I saw him. I have been advising him to use topical Bactroban washing the finger. Culture I did last time showed a few staph epidermidis which I was willing to dismiss as superficial skin contaminant. He arrives today with the finger wound looking better however the paronychia a seems worse 7/9; 2-week follow-up. He does not have an open wound on the tip of his third left finger. I thought he had an initial ischemic wound on the tip of the finger as well as a paronychia a medially. All of this appears to be closed. 8/6-Patient returns after 3 weeks for follow-up and has a right second met head plantar callus with a significant area of maceration hiding an ulcer ABI repeated today on right - 1.26 8/13-Patient returns at 1 week, the right second met head plantar wound appears to be slightly better, it is surrounded by the callus area we are using silver alginate 8/20; the patient came back to clinic 2 weeks ago with a wound on the right second metatarsal head. He apparently told me he developed callus in this area but also went away from his custom shoes to a pair of running shoes. Using silver  alginate. 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 There might be much we can do to offload this although the patient tells me he has a wheelchair that he is using to try and stay off the wound is much as possible 8/27; right second metatarsal head. Still small punched out area with thick callus and subcutaneous tissue around the wound. We have been using silver collagen. This is always been an issue with this man's wound especially on this foot 9/3; right second met head. Still the same punched-out area with thick callus and subcutaneous tissue around the wound removing the circumference demonstrates repetitively undermining area. I have removed all the subcutaneous tissue associated with this. We have been using silver collagen 9/15; right second metatarsal head. Same punched-out thick callus and subcutaneous tissue around the wound area. Still requiring debridement we have been using silver collagen I changed back to silver alginate X-ray last time showed no evidence of osteomyelitis. Culture showed Klebsiella and he was started and Keflex apparently just 4 days ago 9/24; right second metatarsal head. He is completed the antibiotics I gave him for 10 days. Same punched-out thick callus and subcutaneous tissue around the area. Still requiring debridement. We have been using silver alginate. The area itself looks swollen and this could be just La[place] that comes on this area with walking on a prosthesis on the left however I think he needs an MRI and I am going to order that today. There is not an option to offload this further 10/1; right second plantar metatarsal head. MRI booked for October 6. Generally looking better today using silver alginate 10/8; right second plantar metatarsal head. MRI that was done on 10/6 was negative for osteomyelitis noted prior amputations of the second and fourth toes at the MTP. Prior amputation of the third toe to the base of the middle phalanx.  Prior amputation of the fifth  toe to the head of the metatarsal. 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 He does not recall about hearing about this. He did not have any pain but then again he has reduced sensation from diabetic neuropathy 10/15; right second plantar metatarsal head. Still in the same condition callus nonviable subcutaneous tissue which cleans up nicely with debridement but reforms by the next week. The patient tells me that he is offloading this is much as he can including taking his wheelchair to dialysis. We have been using silver alginate, changed to silver collagen today 10/22; right second plantar metatarsal head. Wound measures smaller but still thick callus around this wound. Still requiring debridement 11/5 right second plantar metatarsal head. Less callus around the wound circumference but still requiring debridement. There is still some undermining which I cleaned out the wound looks healthy but still considerable punched out depth with a relatively small circumference to the wound there is no palpable bone no purulent drainage 11/12; right second plantar metatarsal head. Small wound with thick callus tissue around the wound and significant undermining. We have been using silver collagen after my continuous debridements of this area 11/19; patient arrives in today with purulent drainage coming out of the wound in the second third met head area on the right foot. Serosanguineous thick drainage. Specimen obtained for culture. 11/21/2019 on evaluation today patient appears to be doing well with regard to his foot ulcer. The good news is is culture came back negative for any bacteria there was no growth. The other good news is his x-ray also appears to be doing well. The foot is also measuring much better than what it was. Overall I am very pleased with how things seem to be progressing. 12/22; patient was in [facility] for several weeks due to the death of a family member. He said he stayed off the wound using silver alginate. 01/03/2020. Patient has a small open area with roughly 0.3 cm of circumferential undermining. The orifice looks smaller. We have been using silver collagen. There is not a wound more aggressive way to offload this area as he has a prosthesis on the other leg 1/21; again the same small area is 2 weeks ago. We have been using silver collagen I change that to endoform today I really believe that this is an offloading issue 2/4; the area on the right third met head. We have been using endoform. 2/11 the area on the right third met head we have been using silver alginate. 2/25; the area on the right third met head. There is much less callus on this today. The patient states he is offloading this even more aggressively. As an example he is taking his wheelchair into dialysis on most days 3/4; right third metatarsal head. Again he has eschar over the surface of the wound which I removed with a #3 curette. Some subcutaneous debris. This has 0.8 cm of direct probing depth. I cannot feel any bone here. I did do a culture the area 3/11; right third metatarsal head. Again an eschared area over the wound which almost makes this look superficial. Again debridement reveals a wound with probing depth probably not much different from last week there is no palpable bone no palpable purulence. The patient tells me that he is making every effort to offload this area properly. He is taking wheelchair into dialysis etc. There is no option for forefoot offloading or a total contact cast. We used  Oasis #1 today 3/18; again callus and eschar over the wound surface. I remove this but still a probing wound although only 3 mm in depth this time. This is an improvement 3/25; again callus and eschar over the wound surface which I removed the wound is much deeper today at 0.7 cm. There is no palpable bone. Because of  the appearance of this a culture was done. I am not going to put the Oasis back in this. Concerned about underlying infection. Notable for the fact that he is just finished doxycycline for the last go round of this 3/30; still 0.7 cm in depth which is disappointing. Culture I gave last time showed a few Staph aureus which is methicillin susceptible. This should have been taken care of by the recent course of doxycycline I gave him. We are using silver alginate 4/6; almost 1 cm in depth. We have been using silver alginate with underlying Bactroban. 4/15; about 1 cm in depth still. There is no palpable bone but there is undermining thick surface again as usual. Our intake nurse noted what looked to be purulent drainage I suppose that could have been Bactroban but I did a culture for CandS 4/20; depth down 0.7 cm. Culture from last time was negative we have been using Bactroban and silver alginate 4/29; depth down 0.5 cm. Using silver alginate. He reports some drainage 5/6; comes in with the wound worse. Wound is deeper and tunnels. Measures 0.9 depth and an undermining area of 1.8 cm. 5/18; there is a change the dressing last week. Still using Sorbact hydrogel. What I can see of the base of the wound looks better. 6/1. Not much change. I changed him to endoform today. He has 1.1 cm in depth no palpable bone Electronic Signature(s) Signed: 05/27/2020 5:52:05 PM By: Linton Ham MD Entered By: Linton Ham on 05/27/2020 12:58:20 -------------------------------------------------------------------------------- Physical Exam Details Patient Name: Date of Service: [name] Shines B. 05/27/2020 10:30 A M Medical Record Number: [id number] Patient Account Number: [id number] Date of Birth/Sex: Treating RN: April 17, 1951 (68 y.o. [name] Primary Care Provider: [name] Other Clinician: Referring Provider: Treating Provider/Extender: Tanna Savoy in Treatment:  11 Constitutional Patient is hypotensive. However he looks well and this is not unusual for him. Pulse regular and within target range for patient.Marland Kitchen Respirations regular, non- labored and within target range.Marland Kitchen Appears in no distress. Notes Wound exam; small punched out wound over the right second metatarsal head. 1.1 cm of depth. I removed callus and subcutaneous tissue from the wound margin using a #3 curette. There is no palpable bone. Electronic Signature(s) Signed: 05/27/2020 5:52:05 PM By: Linton Ham MD Signed: 05/27/2020 5:52:05 PM By: Linton Ham MD Entered By: Linton Ham on 05/27/2020 12:59:49 -------------------------------------------------------------------------------- Physician Orders Details Patient Name: Date of Service: [name], [name] 05/27/2020 10:30 A M Medical Record Number: [id number] Patient Account Number: [id number] Date of Birth/Sex: Treating RN: 04-05-1951 (69 y.o. 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 Nurse Visit: - 1 week Dressing Change Frequency Wound #41 Right Metatarsal head second Do not change entire  dressing for one week. Skin Barriers/Peri-Wound Care Moisturizing lotion - right leg. Wound Cleansing May shower with protection. - patient to use a cast protector. Primary Wound Dressing Wound #41 Right Metatarsal head second Endoform Secondary Dressing Wound #41 Right Metatarsal head second Dry Gauze Other: - felt to  offload periwound. Edema Control Kerlix and Coban - Right Lower Extremity Avoid standing for long periods of time Elevate legs to the level of the heart or above for 30 minutes daily and/or when sitting, a frequency of: - throughout the day. Additional Orders / Instructions Other: - minimize walking and standing on foot to aid in wound healing and callus buildup. Laboratory naerobe culture (MICRO) - non healing wound with draiange - (ICD10 E11.621 - Type 2 diabetes Bacteria identified in Unspecified specimen by A mellitus with foot ulcer) LOINC Code: 997-7 Convenience Name: Anerobic culture Electronic Signature(s) Signed: 05/27/2020 5:52:05 PM By: Linton Ham MD Signed: 05/28/2020 4:36:43 PM By: Carlene Coria RN Entered By: Carlene Coria on 05/27/2020 12:05:28 -------------------------------------------------------------------------------- Problem List Details Patient Name: Date of Service: [name] Hones EL B. 05/27/2020 10:30 A M Medical Record Number: [id number] Patient Account Number: [id number] Date of Birth/Sex: Treating RN: [birth year] (68 y.o. 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, other extremity 06/12/2019 No Yes L03.115 Cellulitis of right lower limb 11/15/2019 No Yes Inactive Problems ICD-10 Code Description Active Date Inactive Date L03.114 Cellulitis of left upper limb 06/12/2019 06/12/2019 L98.498 Non-pressure chronic ulcer of skin of other sites with other specified severity 06/12/2019 06/12/2019 S61.209D Unspecified open wound of  unspecified finger without damage to nail, subsequent 06/12/2019 06/12/2019 encounter Resolved Problems Electronic Signature(s) Signed: 05/27/2020 5:52:05 PM By: Linton Ham MD Entered By: Linton Ham on 05/27/2020 12:57:24 -------------------------------------------------------------------------------- Progress Note Details Patient Name: Date of Service: [name] Shines B. 05/27/2020 10:30 A M Medical Record Number: [id number] Patient Account Number: [id number] Date of Birth/Sex: Treating RN: 1951-12-17 (68 y.o. [name] Primary Care Provider: [name] Other Clinician: Referring Provider: Treating Provider/Extender: Tanna Savoy in Treatment: 50 Subjective History of Present Illness (HPI) The following HPI elements were documented for the patient's wound: Location: Patient presents with a wound to bilateral feet. Quality: Patient reports experiencing essentially no pain. Severity: Mildly severe wound with no evidence of infection Duration: Patient has had the wound for greater than 2 weeks prior to presenting for treatment The patient is a pleasant 69 yrs old bm here for evaluation of ulcers on the plantar aspect of both feet. 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 He has a left arm graft for access. He has been trying to stay off his feet for weeks but does not seem to have any improvement in the wound. He has been seeing someone at the foot center and was referred to the Wound Care center for further evaluation. 12/30/15 the patient has 3 wounds one over the right first metatarsal head and 2 on the left foot at the left fifth and lleft first metatarsal head. All of these look relatively similar. The one over the left fifth probe to bone I could not prove  that any of the others did. I note his MRI in November that did not show osteomyelitis. His peripheral pulses seem robust. All of these  underwent surgical debridement to remove callus nonviable skin and subcutaneous tissue 01/06/16; the patient had his sutures removed from the right fifth ray amputation. There may be a small open part of this superiorly but otherwise the incision looks good. Areas over his right first, left first and left fifth metatarsal head all underwent surgical debridement as a varying degree of callus, skin and nonviable subcutaneous tissue. The area that is most worrisome is the right fifth metatarsal head which has a wound probes precariously close to bone. There is no purulent drainage or erythema 01/13/16; I'm not exactly sure of the status of the right fifth ray amputation site however he follows with Dr. [name] later this week. The area over his right first plantar metatarsal head, left first and fifth plantar metatarsal head are all in the same status. Thick circumferential callus, nonviable subcutaneous tissue. Culture of the left fifth did not culture last week 01/20/16; all of the patient's wounds appear and roughly the same state although his amputation site on the right lateral foot looks better. His wounds over the right first, left first and left fifth metatarsal heads all underwent difficult surgical debridement removing circumferential callus nonviable skin and subcutaneous tissue. There is no overt evidence of infection in these areas. MRI at the end of December of the left foot did not show osteomyelitis, right foot showed osteomyelitis of the right fifth digit he is now status post amputation. 01/27/16 the patient's wounds over his plantar first and fifth metatarsal heads on the left all appear better having been started on a total contact cast last week. They were debridement of circumferential callus and nonviable subcutaneous tissue as was the wound over the first metatarsal head. His surgical incision on the right also had some light surface debridement done. 03/23/2016 -- the patient was  doing really well and most of his wounds had almost completely healed but now he came back today with a history of having a discharge from the area of his right foot on the plantar aspect and also between his first and second toe. He also has had some discharge from the left foot. Addendum: I spoke to the PA [name] at the dialysis center whose fax number is [phone]. We discussed the infection the patient has and she will put the patient on vancomycin and Fortaz until the final culture report is back. We will fax this as soon as available. 03/30/2016 -- left foot x-ray IMPRESSION:No definitive osteomyelitis noted. X-ray of the right foot -- IMPRESSION: 1. Soft tissue swelling. Prior amputation right fifth digit. No acute or focal bony abnormality identified. If osteomyelitis remains a clinical concern, MRI can be obtained. 2. Peripheral vascular disease. His culture reports have grown an MSSA -- and we will fax this report to his hemodialysis center. He was called in a prescription of oral doxycycline but I have told her not to fill this in as he is already on IV antibiotics. 04/06/2016 -- a few days ago, I spoke to the hemodialysis center nurse who had stopped the IV antibiotics and he was given a prescription for doxycycline 100 mg by mouth twice a day for a week and he is on this at the present time. 05/11/2016 -- he has recently seen his PCP this week and his hemoglobin A1c was 7. He is working on his paperwork to  get his orthotic shoes. 05/25/2016 -- -- x-ray of the left foot IMPRESSION:No acute bony abnormality. No radiographic changes of acute osteomyelitis. No change since prior study. X-ray of the right foot -- IMPRESSION: Postsurgical changes are seen involving the fifth toe. No evidence of acute osteomyelitis. he developed a large blister on the medial part of his right foot and this opened out and drain fluid. 06/01/2016 -- he has his MRI to be done this  afternoon. 06/15/2016 -- MRI of the left forefoot without contrast shows 2 separate regions of cutaneous and subcutaneous edema and possible ulceration and blistering along the ball of the foot. No obvious osteomyelitis identified. MRI of the right foot showed cutaneous and subcutaneous thickening plantar to the first digit sesamoid with an ulcer crater but no underlying osteomyelitis is identified. 06/22/2016 -- the right foot plantar ulcer has been draining a lot of seropurulent material for the last few days. 06/30/2016 -- spoke to the dialysis center and I believe I spoke to Terrell a PA at the center who discussed with me and agreed to putting [name] on vancomycin until his cultures arrive. On review of his culture report no WBCs were seen or no organisms were seen and the culture was reincubated for better growth. The final report is back and there were no predominant growth including Streptococcus or Staphylococcus. Clinically though he has a lot of drainage from both wounds a lot of undermining and there is further blebs on the left foot towards the interspace between his first and second toe. 08/10/2016 -- the culture from the right foot showed normal skin flora and there was no Staphylococcus aureus was group A streptococcus isolated. 09/21/2016 -- -- MRI of the right foot was done on 09/13/2016 - IMPRESSION: Findings most consistent with acute osteomyelitis throughout the great toe, sesamoid bones and plantar aspect of the head of the first metatarsal. Fluid in the sheath of the flexor tendon of the great toe could be sympathetic but is worrisome for septic tenosynovitis. First MTP joint effusion worrisome for septic joint. He was admitted to the hospital on 09/11/2016 and treated for a fever with vancomycin and cefepime. He was seen by Dr. Bobby Rumpf of infectious disease who recommended 6 weeks treatment with vancomycin and ceftazidime with his hemodialysis and to continue to see  as in the wound clinic. Vascular consult was pending. The patient was discharged home on 09/14/2016 and he would continue with IV antibiotics for 6 weeks. 09/28/16 wound appears reasonably healthy. Continuing with total contact cast 10/05/16 wound is smaller and looking healthy. Continue with total contact cast. He continues on IV antibiotics 10/12/2016 -- he has developed a new wound on the dorsal aspect of his left big toe and this is a superficial injury with no surrounding cellulitis. He has completed 30 days of IV antibiotics and is now ready to start his hyperbaric oxygen therapy as per his insurance company's recommendation. 10/19/2016 -- after the cast was removed on the right side he has got good resolution of his ulceration on the right plantar foot. he has a new wound on the left plantar foot in the region of his fourth metatarsal and this will need sharp debridement. 10/26/2016 -- Xray of the right foot complete: IMPRESSION: Changes consistent with osteomyelitis involving the head of the first metatarsal and base of the first proximal phalanx. The sesamoid bones are also likely involved given their positioning. 11/02/2016 -- there still awaiting insurance clearance for his hyperbaric oxygen therapy and hopefully he will  begin treatment soon. 11/09/2016 -- started with hyperbaric oxygen therapy and had some barotrauma to the right ear and this was seen by ENT who was prescribed Afrin drops and would probably continue with HBO and he is scheduled for myringotomy tubes on Friday 11/16/2016 -- he had his myringotomy tubes placed on Friday and has been doing much better after that with some fluid draining out after hyperbaric treatment today. The pain was much better. 11/30/2016 -- over the last 2 days he noticed a swelling and change of color of his right second toe and this had been draining minimal fluid. 12/07/2016 -- x-ray of the right foot -- IMPRESSION:1. Progressive ulceration at the  distal aspect of the second digit with significant soft tissue swelling and osseous changes in the distal phalanx compatible with osteomyelitis. 2. Chronic osteomyelitis at the first MTP joint. 3. Ulcerations at the second and third toes as well without definite osseous changes. Osteomyelitis is not excluded. 4. Fifth digit amputation. On 12/01/2016 oo I spoke to Dr. Bobby Rumpf, the infectious disease specialist, who kindly agreed to treat this with IV antibiotics and he would call in the order to the dialysis center and this has been discussed in detail with the patient who will make the appropriate arrangements. The patient will also book an appointment as soon as possible to see Dr. [name] in the office. Of note the patient has not been on antibiotics this entire week as the dialysis center did not receive any orders from Dr. [name]. I got in touch with Dr. [name] who tells me the patient has an appointment to see him this coming Wednesday. 12/14/2016 -- the patient is on ceftazidime and vancomycin during his dialysis, and I understand this was put on by his nephrologist Dr. Raliegh Ip possibly after speaking with infectious disease Dr. [name] 12/23/16; patient was on my schedule today for a wound evaluation as he had difficulties with his schedule earlier this week. I note that he is on vancomycin and ceftazidine at dialysis. In spite of this he arrives today with a new wound on the base of the right second toe this easily probes to bone. The known wound at the tip of the second toe With this area. He also has a superficial area on the medial aspect of the third toe on the side of the DIP. This does not appear to have much depth. The area on the plantar left foot is a deep area but did not probe the bone 12/28/16 -- he has brought in some lab work and the most recent labs done showed a hemoglobin of 12.1 hematocrit of 36.3, neutrophils of 51% WBC count of 5.6, BN of 53, albumin of 4.1 globulin of  3.7, vancomycin 13 g per mL. 01/04/2017 -- he saw Dr. [name] who has recommended a amputation of the right second toe and he is awaiting this date. He sees Dr. Bobby Rumpf of infectious disease tomorrow. The bone culture taken on 12/28/2016 had no growth in 2 days. 01/11/2017 -- was seen by Dr. Bobby Rumpf regarding the management and has recommended a eval by vascular surgeons. He recommended to continue the antibiotics during his hemodialysis. Xray of the left foot -- IMPRESSION: No acute fracture, dislocation, or osseous erosion identified. 01/18/2017 -- he has his vascular workup later today and he is going to have his right foot second toe amputation this coming Friday by Dr. [name]. We have also put in for a another 30 treatments with hyperbaric oxygen therapy 01/25/2017 --  I have reviewed Dr. Donnetta Hutching is vascular report from last week where he reviewed him and thought his vascular function was good enough to heal his amputation site and no further tests were recommended. He also had his orthopedic related to surgery which is still pending the notes and we will review these next week. 02/01/2017 -- the operative remote of Dr. 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 02/08/2017 --he was seen by Dr. [name] on 02/03/2017 -- patient is on [place]. after a thorough review he had recommended to continue with antibiotics during hemodialysis. The patient was seen by Dr. [name], but I do not find any follow-up note on the electronic medical record. he has removed the dressing over the right foot to remove the sutures and asked him  to see him back in a month's time 02/22/2017 -- patient has been febrile and has been having symptoms of the upper respiratory tract infection but has not been checked for the flu and it's been over 4 days now. He says he is feeling better today. Some drainage between his left first and second toe and this needed to be looked at 03/08/2017 -- was seen by Dr. Bobby Rumpf on 03/07/2017 after review he will stop his antibiotics and see him back in a month to see if he is feeling well. 03/15/2017 -- he has completed his course of hyperbaric oxygen therapy and is doing fine with his health otherwise. 03/22/2017 -- his nutritionist at the dialysis center has recommended a protein supplement to help build his collagen and we will prescribe this for him when he has the details 05/03/2017 -- he recently noticed the area on the plantar aspect of his fifth metatarsal head which opened out and had minimal drainage. 05/10/2017 -- -- x-ray of the left foot -- IMPRESSION:1. No convincing conventional radiographic evidence of active osteomyelitis.2. Active soft tissue ulceration at the tip of the fifth digit, and at the lateral aspect of the foot adjacent the base of the fifth metatarsal. 3. Surgical changes of prior fourth toe amputation. 4. 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 5. Small vessel atherosclerotic vascular calcifications. 6. 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 ======= Readmission after 5 weeks: 06/15/2017 -- the patient returns after 5 weeks having had an MRI done on 05/17/2017 MRI of the left foot without contrast showed soft tissue ulcer overlying the base of the fifth metatarsal. 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 No drainable fluid collection to suggest an abscess. He was in hospital between 05/16/2017 and 05/25/2017 -- and on discharge was asked to  follow-up with Dr. [name] and Dr. [name]. He was treated 2 weeks post discharge with vancomycin plus ceftriaxone with hemodialysis and oral metronidazole 500 mg 3 times a day. 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 last hemoglobin A1c was 6.7% He had a postoperative MR of the foot on 05/23/2017 -- which showed no new areas of cortical bone loss, edema or soft tissue ulceration to suggest osteomyelitis. the patient has  completed his course of IV antibiotics during dialysis and is to see the infectious disease doctor tomorrow. Dr. [name] had seen him after suture removal and asked him to keep the wound with a dry dressing 06/21/2017 -- he was seen by Dr. Bobby Rumpf of infectious disease on 06/16/2017 -- he stopped his Flagyl and will see him back in 6 weeks. He was asked to follow-up with Dr. [name] and with the wound clinic clinic. 07/05/17; bone biopsy from last time showed acute osteomyelitis. This is from the reminiscent in the left fifth metatarsal. Culture result is apparently still pending [holding for anaerobe}. From my understanding in this case this is been a progressive necrotic wound which is deteriorated markedly over the last 3 weeks since he returned here. He now has a large area of exposed bone which was biopsied and cultured last week. Dr. Graylon Good has put him on vancomycin and Fortaz during his hemodialysis and Flagyl orally. He is to see Dr. [name] next week 07/19/2017 -- the patient was reviewed by Dr. [name] of orthopedics who reviewed the case in detail and agreed with the plan to continue with IV antibiotics, aggressive wound care and hyperbaric oxygen therapy. He would see him back in 3 weeks' time 08/09/2017 -- saw Dr. Bobby Rumpf on 08/03/2017 -he was restarted on his antibiotics for 6 weeks which included vanco, ceftaz and Flagyl. he recommended continuing his antibiotics for 6 weeks  and reevaluate his completion date. He would continue with wound care and hyperbaric oxygen therapy. 08/16/2017 -- I understand he will be completing his 6 weeks of antibiotics sometime later this week. 09/19/2017 -- he has been without his wound VAC for the last week due to lack of supplies. He has been packing his wound with silver alginate. 10/04/2017 -- he has an appointment with Dr. [name] tomorrow and hence we will not apply the wound VAC after his dressing changes today. 10/11/2017 -- he was seen by Dr. Bobby Rumpf on 10/05/2017, noted that the patient was currently off antibiotics, and after thorough review he recommended he follow-up with the wound care as per plan and no further antibiotics at this point. 11/29/17; patient is arrived for the wound on his left lateral foot.Marland Kitchen He is completed IV antibiotics and 2 rounds of hyperbaric oxygen for treatment of underlying osteomyelitis. He arrives today with a surface on most of the wound area however our intake nurse noted drainage from the superior aspect. This was brought to my attention. 12/13/2017 -- the right plantar foot had a large bleb and once the bleb was opened out a large callus and subcutaneous debris was removed and he has a plantar ulcer near the fourth metatarsal head 12/28/17 on evaluation today patient appears to be doing acutely worse in regard to his left foot. The wound which has been appearing to do better as now open up more deeply there is bone palpable at the base of the wound unfortunately. He tells me that this feels "like it did when he had osteomyelitis previously" he also noted that his second toe on the left foot appears to be doing worse and is swollen there does appear to be some fluid collected underneath. His right foot plantar ulcer appears to be doing somewhat better at this point and there really is no complication at the site currently. No fevers, chills, nausea, or vomiting noted at this time. Patient  states that he normally has no pain at this site however. T oday he is having significant pain.  01/03/18; culture done last week showed methicillin sensitive staph aureus and group B strep. He was on Septra however he arrives today with a fever of 101. He has a functional dialysis shunt in his left arm but has had no pain here. No cough. He still makes some urine no dysuria he did have abdominal pain nausea and diarrhea over the weekend but is not had any diarrhea since yesterday. Otherwise he has no specific complaints 01/05/18; the patient returns today in follow-up for his presentation from 1/8. At that point he was febrile. I gave him some Levaquin adjusted for his dialysis status. He tells me the fever broke that night and it is not likely that this was actually a wound infection. He is gone on to have an MRI of the left foot; this showed interval development of an abnormal signal from the base of the third and fourth metatarsal and in the cuboid reminiscent consistent with osteomyelitis. There is no mention of the left second toe I think which was a concern when it was ordered. The patient is taking his Levaquin 01/10/18; the patient has completed his antibiotics today. The area on the left lateral foot is smaller but it still probes easily to bone. He has underlying osteomyelitis here and sees Dr. Megan Salon of infectious disease this week ooThe area on the right third plantar metatarsal head still requires debridement not much change in dimensions [facility] has a new wound on the medial tip of his right third toe again this probes to bone. He only noticed this 2 days ago 01/17/18; the patient saw Dr. [name] last week and he is back on Vanco and Fortaz at dialysis. 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 We are previously seeing him for a wound also on the base of the fourth med head on  the right. X-ray of this area did not show osteomyelitis in relation to a new wound on the tip of his right third toe. Again this probes to bone. Finally his second toe on the left which didn't really show anything on the MRI at least no report appears to have a separated cutaneous area which I think is going to come right off the tip of his toe and leave exposed bone. Whether this is infectious or ischemic I am not clear Culture I did from the third toe last week which was his new wound was negative. Plain x-ray on the right foot did not show osteomyelitis in the toes 01/24/18; the patient is going went to see Dr. [name]. He is going to have a amputation of the left second and right third toe although paradoxically the left second toe looks better than last week. We have treating a deep probing wound on the left lateral foot and the area over the third metatarsal head on the right foot. 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 Also a debridement including bone of the left lateral foot and a closure which is still sutured. This is a bit surprising. Also apparent debridement of the base of the right third toe wound. 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 I'm not sure exactly  where we are in the frame of things here. The wound on the left lateral foot is still sutured also the amputation sites of the left second and right third toe. He is been using Xeroform to the sutured areas silver alginate to the right foot 02/14/18; he continues on Rocephin at dialysis for osteomyelitis. I still don't have a good sense of where we are in the treatment duration. The wounds on the left lateral foot had the sutures removed and this clearly still probes deeply. We debrided the area with  a #5 curet. We'll use silver alginate to the wound oothe area over the right third met head also required debridement of callus skin and subcutaneous tissue and necrotic debris over the wound surface. This tunnels superiorly but I did not unroofed this today. ooThe areas on the tip of his toes have a crusted surface eschar I did not debridement this either. 02/21/18; he continues on Rocephin at dialysis for osteomyelitis. I don't have a good sense of where we are and the treatment duration. The wounds on the left lateral foot is callused over on presentation but requires debridement. The area on the right third med head plantar aspect actually is measuring smaller 02/28/18;patient is on Vanco and Fortaz at dialysis, I'm not sure why I thought this was Rocephin above unless it is been changed. I don't have a good sense of time frame here. Using silver collagen to the area on the left lateral foot and right third med head plantar foot 03/07/18; patient is completing bank and Fortaz at dialysis soon. He is using Silver collagen to the area on the left lateral foot and the right third metatarsal head. He has a smaller superficial wound distal to the left lateral foot wound. 03/14/18-he is here in follow-up evaluation for multiple ulcerations to his bilateral feet. He presents with a new ulceration to the plantar aspect of the left foot underneath the blister, this was deroofed to reveal a partial thickness ulcer. He is voicing no complaints or concerns, tolerated dialysis yesterday. We will continue with same treatment plan and he will follow-up next week 03/21/18; the patient has a area on the lateral left foot which still has a small probing area. The overall surface area of the wound is better. He presented with a new ulceration on the left foot plantar fifth metatarsal head last week. The area on the right third metatarsal head appears smaller.using silver alginate to all wound areas. The patient is  changing the dressing himself. He is using a Darco forefoot offloading are on the right and a healing sandal on the left 03/28/18; original wound left lateral foot. Still nowhere close to looking like a heeling surface oosecondary wound on the left lateral foot at the base of his question fifth metatarsal which was new last week ooRight third metatarsal head. ooWe have been using silver alginate all wounds 04/04/18; the original wound on the left lateral foot again heavy callus surrounding thick nonviable subcutaneous tissue all requiring debridement. The new wound from 2 weeks ago just near this at the base of the fifth metatarsal head still looks about the same ooUnfortunately there is been deterioration in the third medical head wound on the right which now probes to bone. I must say this was a superficial wound at one point in time and I really don't have a good frame of reference year. I'll have to go back to look through records about what we know about the right foot. He is  been previously treated for osteomyelitis of the left lateral foot and he is completing antibiotics vancomycin and Fortaz at dialysis. He is also been to see Dr. [name]. Somewhere in here as somebody is ordered a VASCULAR evaluation which was done on 03/22/18. On the right is anterior tibial artery was monophasic triphasic at the posterior tibial artery. On the left anterior tibial artery monophasic posterior tibial artery monophasic. ABI in the right was 1.43 on the left 1.21. TBIs on the right at 0.41 and on the left at 0.32. A vascular consult was recommended and I think has been arranged. 04/11/18; Mr. Buckles has not had an MRI of the right foot since 2017. Recent x-ray of the right foot done in January and February was negative. However he has had a major deterioration in the wound over the third met head. He is completed antibiotics last week at dialysis [place]. I think he is going to see vascular in  follow-up. The areas on the left lateral foot and left plantar fifth metatarsal head both look satisfactory. I debrided both of these areas although the tissue here looks good. The area on the left lateral foot once probe to bone it certainly does not do that now 04/18/18; MRI of the right foot is on Thursday. He has what looks to be serosanguineous purulent drainage coming out of the wound over the right third metatarsal head today. He completed antibiotics bank and Fortaz 2 weeks ago at dialysis. He has a vascular follow-up with regards to his arterial insufficiency although I don't exactly see when that is booked. The areas on the left foot including the lateral left foot and the plantar left fifth metatarsal head look about the same. 04/25/18-He is here in follow-up evaluation for bilateral foot ulcers. MRI obtained was negative for osteomyelitis. Wound culture was negative. We will continue with same treatment plan he'll follow-up next week 05/02/18; the right plantar foot wound over the third metatarsal head actually looks better than when I last saw this. His MRI was negative for osteomyelitis wound culture was negative. ooOn the left plantar foot both wounds on the plantar fifth metatarsal head and on the lateral foot both are covered in a very hard circumferential callus. 05/09/18; right plantar foot wound over the third metatarsal head stable from last week. ooLeft plantar foot wound over the fifth metatarsal head also stable but with callus around both wound areas ooThe area on the left lateral foot had thick callus over a surface and I had some thoughts about leaving this intact however it felt boggy. ooWe've been using silver alginate all wounds 05/16/18; since the patient was last here he was hospitalized from 5/15 through 5/19. 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 This grew  MRSA. He was placed on vancomycin.MRI of the left foot suggested "progressive" osteomyelitis and bone destruction of the cuboid and the base of the fifth metatarsal. Stable erosive changes of the base of the second metatarsal. I'm wondering if they're aware that he had surgery and debridement in the area of the underlying bone previously by Dr. [name]. In any case, He was also revascularized with an angioplasty and stenting of the left tibial peroneal trunk and angioplasty of the left posterior tibial artery. He is on vancomycin at dialysis. He has a 2 week follow-up with infectious disease. He has vascular surgery follow-up. He was started on  Plavix. 05/23/18; the patient's wound on the lateral left foot at the level of the fifth metatarsal head and the plantar wound on the plantar fifth metatarsal head both look better. The area on the right third metatarsal head still has depth and undermining. We've been using silver alginate. He is on vancomycin. He has been revascularized on the left 05/30/18; he continues on vancomycin at dialysis. Revascularized on the left. The area on the left lateral foot is just about closed. Unfortunately the area over the plantar fifth metatarsal head undermining superiorly as does the area over the right third metatarsal head. Both of these significantly deteriorated from last week. He has appointments next week with infectious disease and orthopedics I delayed putting on a cast on the left until those appointments which therefore we made we'll bring him in on Friday the 14th with the idea of a cast on the left foot 06/06/18; he continues on vancomycin at dialysis. Revascularized on the left. He arrives today with a ballotable swelling just above the area on the left lateral foot where his previous wound was. He has no pain but he is reasonably insensate. The difficult areas on the plantar left fifth metatarsal head looks stable whereas the area on the right third plantar  metatarsal head about the same as last week there is undermining here although I did not unroofed this today. He required a considerable debridement of the swelling on the left lateral foot area and I unroofed an abscess with a copious amount of brown purulent material which I obtained for culture. I don't believe during his recent hospitalization he had any further imaging although I need to review this. Previous cultures from this area done in this clinic showed MRSA, I would be surprised if this is not what this is currently even though he is on vancomycin. 06/13/18; he continues on vancomycin at dialysis however he finishes this on Sunday and then graduates the doxycycline previously prescribed by Dr. [name]. The abscess site on the lateral foot that I unroofed last time grew moderate amounts of methicillin-resistant staph aureus that is both vancomycin and tetracycline sensitive so we should be okay from that regard. He arrives today in clinic with a connection between the abscess site and the area on the lateral foot. We've been using silver alginate to all his wound areas. The right third metatarsal head wound is measuring smaller. Using silver alginate on both wound areas 06/20/18; transitioning to doxycycline prescribed by Dr. [name]. The abscess site on the lateral foot that I unroofed 2 weeks ago grew MRSA. That area has largely closed down although the lateral part of his wound on the fifth metatarsal head still probes to bone. I suspect all of this was connected. ooOn the right third metatarsal head smaller looking wound but surrounded by nonviable tissue that once again requires debridement using silver alginate on both areas 06/27/18; on doxycycline 100 twice a day. The abscess on the lateral foot has closed down. Although he still has the wound on the left fifth metatarsal head that extends towards the lateral part of the foot. The most lateral part of this wound probes to bone ooRight  third metatarsal head still a deep probing wound with undermining. Nevertheless I elected not to debridement this this week ooIf everything is copious static next week on the left I'm going to attempt a total contact cast 07/04/18; he is tolerating his doxycycline. The abscess on the left lateral foot is closed down although there is still  a deep wound here that probes to bone. We will use silver collagen under the total contact cast Silver collagen to the deep wound over the right third metatarsal head is well 07/11/18; he is tolerating doxycycline. The left lateral fifth plantar metatarsal head still probes deeply but I could not probe any bone. We've been using silver collagen and this will be the second week under the total contact cast ooSilver collagen to deep wound over the right third metatarsal head as well 07/18/18; he is still taking doxycycline. The left lateral fifth plantar metatarsal this week probes deeply with a large amount of exposed bone. Quite a deterioration. He required extensive debridement. Specimens of bone for pathology and CNS obtained ooAlso considerable debridement on the right third metatarsal head 07/25/18; bone for pathology last week from the lateral left foot showed osteomyelitis. CandS of the bone Enterobacter. And Klebsiella. He is now on ciprofloxacin and the doxycycline is stopped [Dr. [name] of infectious disease] area He has an appointment with Dr. [name] tomorrow I'm going to leave the total contact cast off for this week. May wish to try to reapply that next week or the week after depending on the wound bed looks. I'm not sure if there is an operative option here, previously is followed with Dr. [name] 08/01/18 on evaluation today patient presents for reevaluation. He has been seen by infectious disease, Dr. [name], and he has placed the patient back on Ceftaz currently to be given to him at dialysis three times a week. Subsequently the patient has a wound on  the right foot and is the left foot where he currently has osteomyelitis. Fortunately there does not appear to be the evidence of infection at this point in time. Overall the patient has been tolerating the dressing changes without complication. We are no longer due lives in the cast of left foot secondary to the infection obviously. 08/10/18 on evaluation today patient appears to be doing rather well all things considered in regard to his left plantar foot ulcer. He does have a significant callous on the lateral portion of his left foot he wonders if I can help clean that away to some degree today. Fortunately he is having no evidence of infection at this time which is good news. No fevers chills noted. With that being said his right plantar foot actually does have a significant callous buildup around the wound opening this seems to not be doing as well as I would like at this point. I do believe that he would benefit from sharp debridement at this site. Subsequently I think a total contact cast would be helpful for the right foot as well. 08/17/18; the patient arrived today with a total contact cast on the right foot. This actually looks quite good. He had gone to see Dr. Megan Salon of infectious disease about the osteomyelitis on the left foot. I think he is on IV Fortaz at dialysis although I am not exactly sure of the rationale for the Fortaz at the time of this dictation. He also arrived in today with a swelling on the lateral left foot this is the site of his original wounds in fact when I first saw this man when he was under Dr. Ardeen Garland care I think this was the site of where the wound was located. It was not particularly tender however using a small scalpel I opened it to [place] some moderate amount of purulent drainage. We've been using silver alginate to all wound areas and a total  contact cast on the right foot 08/24/18; patient continues on Fortaz at dialysis for osteomyelitis left foot. Last MRI  was in May that showed progressive osteomyelitis and bone destruction of the cuboid and base of the fifth metatarsal. Last week he had an abscess over this same area this was removed. Culture was negative. ooWe are continuing with a total contact cast to the area on the third metatarsal head on the right and making some good progress here. The patient asked me again about renal transplant. He is not on the list because of the open wounds. 08/31/18; apparently the patient had an interruption in the [place] but he is now back on this at dialysis. Apparently there was a misunderstanding and Dr. Crissie Figures orders. Due to have the MRI of the left foot tonight. ooThe area on the right foot continues to have callus thick skin and subcutaneous tissue around the wound edge that requires constant debridement however the wound is smaller we are using a total contact cast in this area. Alginate all wounds 09/07/18; without much surprise the MRI of his left foot showed osteomyelitis in the reminiscent of his fourth metatarsal but also the third metatarsal. She arrives in clinic today with the right foot and a total contact cast. There was purulent drainage coming out of the wound which I have cultured. Marked deterioration here with undermining widely around the wound orifice. Using pickups and a scalpel I remove callus and subcutaneous tissue from a substantial new opening. ooIn a similar fashion the area on the left lateral foot that was blistered last week I have opened this and remove skin and subcutaneous tissue from this area to expose a obvious new wound. I think there is extension and communication between all of this on the left foot. ooThe patient is on Fortaz at dialysis. Culture done of the right foot. We are clearly not making progress here. ooWe have made him an appointment with Dr. [name] of orthopedics. I think an amputation of the left leg may be discussed. I don't think there is anything that can  be done with foot salvage. 09/14/18; culture from the right foot last time showed a very resistant MRSA. This is resistant to doxycycline. I'm going to try to get linezolid at least for a week. He does not have a appointment with Dr. [name] yet. 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 I'll send a message to Dr. [name] of infectious disease. I want the patient to see Dr. [name] to go over the pros and cons of an amputation which I think will be a BKA. The patient had a question about whether this is curative or not. I told him that I thought it would be although spread of staph aureus infection is not unheard of. MRI of the right foot was done at the end of April 2019 did not show osteomyelitis. The patient last saw Dr. [name] on 9/3. I'll send Dr. [name] a message. I would really like him to weigh the pros and cons of a BKA on the left otherwise he'll probably need IV antibiotics and perhaps hyperbaric oxygen again. He has not had a good response to this in the past. His MRI earlier this month showed progressive damage in the remnants of the fourth and third metatarsal 09/21/2018; sees Dr. [name] of orthopedics next week to discuss a left BKA in response to the osteomyelitis in the left foot. Using silver alginate to  all wound areas. He is completing the Zyvox I did put in for him after last culture showed MRSA 09/29/2018; patient saw Dr. [name] of orthopedics discussed the osteomyelitis in the left foot third and fourth metatarsals. He did not recommend urgent surgery but certainly stated the only surgical option would be a BKA. He is communicating with Dr. [name]. The problem here is the instability of the areas on his foot which constantly generate draining abscesses. I will communicate with Dr. [name] about this. He has an appointment on 10/23 10/05/2018; patient sees Dr. [name] on 10/23. I have sent him a secure message not ordered  any additional antibiotics for now. Patient continues to have 2 open areas on the left plantar foot at the fifth metatarsal head and the lateral aspect of the foot. These have not changed all that much. The area on the right third met head still has thick callus and surrounding subcutaneous tissue we have been using silver alginate 10/12/2018; patient sees Dr. [name] or colleague on 10/23. 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 The area on the right plantar foot is just about closed. The superior wound on the left fifth metatarsal head is callused over but I am not sure if this is closed. The area below it is about the same. Considerable amount of callus on the lateral foot. We have been using silver alginate to the wounds 10/19/2018; the patient saw Dr. [name] on 10/22. He wishes to try and continue to save the left foot. He has been given 6 weeks of oral ciprofloxacin. We continue to put a total contact cast on the right foot. The area over the fifth metatarsal head on the left is callused over/perhaps healed but I did not remove the callus to find out. He still has the wound on the left lateral midfoot requiring debridement. We have been using silver alginate to all wound areas 10/26/2018 ooOn the left foot our intake nurse noted some purulent drainage from the inferior wound [currently the only one that is not callused over] ooOn the right foot even though he is in total contact cast a considerable amount of thick black callus and surface eschar. On this side we have been using silver alginate under a total contact cast. oohe remains on ciprofloxacin as prescribed by Dr. [name] 11/02/2018; culture from last week which is done of the probing area on the left midfoot wound showed MRSA "few". I am going to need to contact Dr. [name] which I will do today I think he is going to need IV antibiotics again. The area on the left  foot which was callused on the side is clearly separating today all of this was removed a copious amounts of callus and necrotic subcutaneous tissue. ooThe area on the right plantar foot actually remained healthy looking with a healthy granulated base. ooWe are using silver alginate to all wound areas 11/09/2018; unfortunately neither 1 of the patient's wounds areas looks at all satisfactory. On the left he has considerable necrotic debris from the plantar wound laterally over the foot. I removed copious amounts of material including callus skin and subcutaneous tissue. On the right foot he arrives with undermining and frankly purulent drainage. Specimen obtained for culture and debridement of the callused skin and subcutaneous tissue from around the circumference. Because of this I cannot put him back in a total contact cast but to be truthful we are really unfortunately not making a  lot of progress I did put in a secure text message to Dr. [name] wondering about the MRSA on the left foot. He suggested vancomycin although this is not started. I was left wondering if he expected me to send this into dialysis. Has we have more purulence on the right side wait for that result before calling dialysis 11/16/2018; I called dialysis earlier this week to get the vancomycin started. I think he got the first dose on Tuesday. The patient tells me that he fell earlier this week twisting his left foot and ankle and he is swelling. He continues to not look well. He had blood cultures done at dialysis on Tuesday he is not been informed of the results. 12/07/2018; the patient was admitted to hospital from 11/22/2018 through 12/02/2018. He underwent a left BKA. Apparently had staph sepsis. In the meantime being off the right foot this is closed over. He follows up with Dr. [name] this afternoon Readmission: 01/10/19 upon evaluation today patient appears for follow-up in our clinic status post having had a left BKA  on 11/20/18. Subsequent to this he actually had multiple falls in fact he tells me to following which calls the wound to be his apparently. He has been seeing Dr. [name] and his physician assistant in the interim. They actually did want him to come back for reevaluation to see if there's anything we can do for me wound care perspective the help this area to heal more appropriately. The patient states that he does not have a tremendous amount of pain which is good news. No fevers, chills, nausea, or vomiting noted at this time. We have gotten approval from Dr. [name] office had [place] for [place] to treat the patient for his stop wound. This is due to the fact that the patient was in the 90 day postop global. 1/21; the patient does not have an open wound on the right foot although he does have some pressure areas that will need to be padded when he is transferring. The dehisced surgical wound looks clean although there is some undermining of 1.5 cm superiorly. 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 This appears healthy but there is undermining. TheraSkin #1 2/11; TheraSkin #2 2/25; TheraSkin #3. Wound is measuring smaller 3/10; TheraSkin #4 wound is measuring smaller 3/24; TheraSkin #5 wound is measuring smaller and looks healthy. 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 He has no open area on the right foot although he does have some callused area over the original third metatarsal head wound. The third metatarsal is also subluxed on the right foot. I have warned him today that he cannot consider wearing a prosthesis for at least a month but he can go for measurements. He is going to need to keep the right foot padded is much as possible in his diabetic shoe indefinitely READMISSION 06/12/2019 [name] is a type II diabetic on dialysis. He has been in this clinic multiple times with  wounds on his bilateral lower extremities. 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 He also has a history of wounds on his right foot but tells me that everything is going well here. He actually came in here with his prosthesis walking with a cane. We were all really quite  gratified to see this He states over the last several weeks he has had a painful area at the tip of the left third finger. His dialysis shunt in his is in the left upper arm and this particularly hurts during dialysis when his fingers get numb and there is a lot of pain in the tip of his left third finger. I believe he is seen vascular surgery. He had a Doppler done to evaluate for a dialysis steal syndrome. He is going for a banding procedure 1 week from today towards the left AV fistula. I think if that is unsuccessful they will be looking at creating a new shunt. He had a course of doxycycline when he which he finished about a week ago. He has been using Bactroban to the finger 6/25; the patient had his banding procedure and things seem to be going better. He is not having pain in his hand at dialysis. The area on the tip of his finger seems about closed. He did however have a paronychia I the last time I saw him. I have been advising him to use topical Bactroban washing the finger. Culture I did last time showed a few staph epidermidis which I was willing to dismiss as superficial skin contaminant. He arrives today with the finger wound looking better however the paronychia a seems worse 7/9; 2-week follow-up. He does not have an open wound on the tip of his third left finger. I thought he had an initial ischemic wound on the tip of the finger as well as a paronychia a medially. All of this appears to be closed. 8/6-Patient returns after 3 weeks for follow-up and has a right second met head plantar callus with a significant  area of maceration hiding an ulcer ABI repeated today on right - 1.26 8/13-Patient returns at 1 week, the right second met head plantar wound appears to be slightly better, it is surrounded by the callus area we are using silver alginate 8/20; the patient came back to clinic 2 weeks ago with a wound on the right second metatarsal head. He apparently told me he developed callus in this area but also went away from his custom shoes to a pair of running shoes. Using silver alginate. 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 There might be much we can do to offload this although the patient tells me he has a wheelchair that he is using to try and stay off the wound is much as possible 8/27; right second metatarsal head. Still small punched out area with thick callus and subcutaneous tissue around the wound. We have been using silver collagen. This is always been an issue with this man's wound especially on this foot 9/3; right second met head. Still the same punched-out area with thick callus and subcutaneous tissue around the wound removing the circumference demonstrates repetitively undermining area. I have removed all the subcutaneous tissue associated with this. We have been using silver collagen 9/15; right second metatarsal head. Same punched-out thick callus and subcutaneous tissue around the wound area. Still requiring debridement we have been using silver collagen I changed back to silver alginate X-ray last time showed no evidence of osteomyelitis. Culture showed Klebsiella and he was started and Keflex apparently just 4 days ago 9/24; right second metatarsal head. He is completed the antibiotics I gave him for 10 days. Same punched-out thick callus and subcutaneous tissue around the area. Still requiring debridement. We have been using silver alginate.  The area itself looks swollen and this could be just [place] that comes on this area with walking on a prosthesis on the  left however I think he needs an MRI and I am going to order that today. There is not an option to offload this further 10/1; right second plantar metatarsal head. MRI booked for October 6. Generally looking better today using silver alginate 10/8; right second plantar metatarsal head. MRI that was done on 10/6 was negative for osteomyelitis noted prior amputations of the second and fourth toes at the [facility]. Prior amputation of the third toe to the base of the middle phalanx. Prior amputation of the fifth toe to the head of the metatarsal. 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 He does not recall about hearing about this. He did not have any pain but then again he has reduced sensation from diabetic neuropathy 10/15; right second plantar metatarsal head. Still in the same condition callus nonviable subcutaneous tissue which cleans up nicely with debridement but reforms by the next week. The patient tells me that he is offloading this is much as he can including taking his wheelchair to dialysis. We have been using silver alginate, changed to silver collagen today 10/22; right second plantar metatarsal head. Wound measures smaller but still thick callus around this wound. Still requiring debridement 11/5 right second plantar metatarsal head. Less callus around the wound circumference but still requiring debridement. There is still some undermining which I cleaned out the wound looks healthy but still considerable punched out depth with a relatively small circumference to the wound there is no palpable bone no purulent drainage 11/12; right second plantar metatarsal head. Small wound with thick callus tissue around the wound and significant undermining. We have been using silver collagen after my continuous debridements of this area 11/19; patient arrives in today with purulent drainage coming out of the wound in the second third met head area on the  right foot. Serosanguineous thick drainage. Specimen obtained for culture. 11/21/2019 on evaluation today patient appears to be doing well with regard to his foot ulcer. The good news is is culture came back negative for any bacteria there was no growth. The other good news is his x-ray also appears to be doing well. The foot is also measuring much better than what it was. Overall I am very pleased with how things seem to be progressing. 12/22; patient was in [facility] for several weeks due to the death of a family member. He said he stayed off the wound using silver alginate. 01/03/2020. Patient has a small open area with roughly 0.3 cm of circumferential undermining. The orifice looks smaller. We have been using silver collagen. There is not a wound more aggressive way to offload this area as he has a prosthesis on the other leg 1/21; again the same small area is 2 weeks ago. We have been using silver collagen I change that to endoform today I really believe that this is an offloading issue 2/4; the area on the right third met head. We have been using endoform. 2/11 the area on the right third met head we have been using silver alginate. 2/25; the area on the right third met head. There is much less callus on this today. The patient states he is offloading this even more aggressively. As an example he is taking his wheelchair into dialysis on most days 3/4; right third metatarsal head. Again he has eschar over  the surface of the wound which I removed with a #3 curette. Some subcutaneous debris. This has 0.8 cm of direct probing depth. I cannot feel any bone here. I did do a culture the area 3/11; right third metatarsal head. Again an eschared area over the wound which almost makes this look superficial. Again debridement reveals a wound with probing depth probably not much different from last week there is no palpable bone no palpable purulence. The patient tells me that he is making every  effort to offload this area properly. He is taking wheelchair into dialysis etc. There is no option for forefoot offloading or a total contact cast. We used Oasis #1 today 3/18; again callus and eschar over the wound surface. I remove this but still a probing wound although only 3 mm in depth this time. This is an improvement 3/25; again callus and eschar over the wound surface which I removed the wound is much deeper today at 0.7 cm. There is no palpable bone. Because of the appearance of this a culture was done. I am not going to put the Oasis back in this. Concerned about underlying infection. Notable for the fact that he is just finished doxycycline for the last go round of this 3/30; still 0.7 cm in depth which is disappointing. Culture I gave last time showed a few Staph aureus which is methicillin susceptible. This should have been taken care of by the recent course of doxycycline I gave him. We are using silver alginate 4/6; almost 1 cm in depth. We have been using silver alginate with underlying Bactroban. 4/15; about 1 cm in depth still. There is no palpable bone but there is undermining thick surface again as usual. Our intake nurse noted what looked to be purulent drainage I suppose that could have been Bactroban but I did a culture for CandS 4/20; depth down 0.7 cm. Culture from last time was negative we have been using Bactroban and silver alginate 4/29; depth down 0.5 cm. Using silver alginate. He reports some drainage 5/6; comes in with the wound worse. Wound is deeper and tunnels. Measures 0.9 depth and an undermining area of 1.8 cm. 5/18; there is a change the dressing last week. Still using Sorbact hydrogel. What I can see of the base of the wound looks better. 6/1. Not much change. I changed him to endoform today. He has 1.1 cm in depth no palpable bone Objective Constitutional Patient is hypotensive. However he looks well and this is not unusual for him. Pulse regular and  within target range for patient.Marland Kitchen Respirations regular, non- labored and within target range.Marland Kitchen Appears in no distress. Vitals Time Taken: 11:22 AM, Height: 73 in, Weight: 202 lbs, BMI: 26.6, Temperature: 98.4 F, Pulse: 85 bpm, Respiratory Rate: 18 breaths/min, Blood Pressure: 93/54 mmHg, Capillary Blood Glucose: 129 mg/dl. General Notes: Wound exam; small punched out wound over the right second metatarsal head. 1.1 cm of depth. I removed callus and subcutaneous tissue from the wound margin using a #3 curette. There is no palpable bone. Integumentary (Hair, Skin) Wound #41 status is Open. Original cause of wound was Gradually Appeared. The wound is located on the Right Metatarsal head second. The wound measures 0.3cm length x 0.4cm width x 1.1cm depth; 0.094cm^2 area and 0.104cm^3 volume. There is Fat Layer (Subcutaneous Tissue) Exposed exposed. There is no tunneling noted, however, there is undermining starting at 12:00 and ending at 12:00 with a maximum distance of 0.9cm. There is a small amount of serosanguineous drainage  noted. The wound margin is well defined and not attached to the wound base. There is large (67-100%) pink granulation within the wound bed. There is no necrotic tissue within the wound bed. 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 There was a Excisional Skin/Subcutaneous Tissue Debridement with a total area of 0.12 sq cm performed by Ricard Dillon., MD. With the following instrument(s): Curette to remove Viable and Non-Viable tissue/material.  Material removed includes Subcutaneous Tissue, Slough, Skin: Dermis, and Skin: Epidermis after achieving pain control using Lidocaine 5% topical ointment. 1 specimen was taken by a Tissue Culture and sent to the lab per facility protocol. A time out was conducted at 11:58, prior to the start of the procedure. A Minimum amount of bleeding was controlled with Pressure. The procedure was tolerated well with a pain level of 0 throughout and a pain level of 0 following the procedure. Post Debridement Measurements: 0.3cm length x 0.4cm width x 1.1cm depth; 0.104cm^3 volume. Character of Wound/Ulcer Post Debridement is improved. Severity of Tissue Post Debridement is: Fat layer exposed. Post procedure Diagnosis Wound #41: Same as Pre-Procedure Plan Follow-up Appointments: Return Appointment in 2 weeks. Nurse Visit: - 1 week Dressing Change Frequency: Wound #41 Right Metatarsal head second: Do not change entire dressing for one week. Skin Barriers/Peri-Wound Care: Moisturizing lotion - right leg. Wound Cleansing: May shower with protection. - patient to use a cast protector. Primary Wound Dressing: Wound #41 Right Metatarsal head second: Endoform Secondary Dressing: Wound #41 Right Metatarsal head second: Dry Gauze Other: - felt to offload periwound. Edema Control: Kerlix and Coban - Right Lower Extremity Avoid standing for long periods of time Elevate legs to the level of the heart or above for 30 minutes daily and/or when sitting, a frequency of: - throughout the day. Additional Orders / Instructions: Other: - minimize walking and standing on foot to aid in wound healing and callus buildup. Laboratory ordered were: Anerobic culture, right foot - non healing wound with draiange 1. I am going to use endoform today. 2. He is approved for Dermagraft ordered for next week 3. There was apparently some slight drainage noted by the intake nurse I went ahead and did a swab culture no empiric  antibiotics Electronic Signature(s) Signed: 05/27/2020 5:52:05 PM By: Linton Ham MD Entered By: Linton Ham on 05/27/2020 13:00:42 -------------------------------------------------------------------------------- SuperBill Details Patient Name: Date of Service: [name] 05/27/2020 Medical Record Number: [id number] Patient Account Number: [id number] Date of Birth/Sex: Treating RN: 1951-08-29 (69 y.o. 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, other extremity L03.115 Cellulitis of right lower limb Facility Procedures The patient participates with Medicare or their insurance follows the Medicare Facility Guidelines: CPT4 Code Description Modifier Quantity [id number] 11042 - DEB SUBQ TISSUE 20 SQ CM/< 1 ICD-10 Diagnosis Description L97.512 Non-pressure chronic ulcer of  other part of right  foot with fat layer exposed E11.621 Type 2 diabetes mellitus with foot ulcer Physician Procedures : CPT4 Code Description Modifier [id number] - WC PHYS SUBQ TISS 20 SQ CM ICD-10 Diagnosis Description 

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
## 2020-05-29 NOTE — Progress Notes (Signed)
CHE, RACHAL ([id number]) Visit Report for 05/20/2020 Arrival Information Details Patient Name: Date of Service: [name], [name] 05/20/2020 9:15 A M Medical Record Number: [id number] Patient Account Number: [id number] Date of Birth/Sex: Treating RN: 05/01/1951 (68 y.o. [name]) Carlene Coria Primary Care Auset Fritzler: [name] Other Clinician: Referring Jocelyne Reinertsen: Treating Kirstein Baxley/Extender: Raiford Noble in Treatment: 19 Visit Information History Since Last Visit Added or deleted any medications: No Patient Arrived: Wheel Chair Any new allergies or adverse reactions: No Arrival Time: 09:44 Had a fall or experienced change in No Accompanied By: self activities of daily living that may affect Transfer Assistance: None risk of falls: Patient Identification Verified: Yes Signs or symptoms of abuse/neglect since last visito No Secondary Verification Process Completed: Yes Hospitalized since last visit: No Patient Requires Transmission-Based Precautions: No Implantable device outside of the clinic excluding No Patient Has Alerts: No cellular tissue based products placed in the center since last visit: Has Dressing in Place as Prescribed: Yes Pain Present Now: No Electronic Signature(s) Signed: 05/29/2020 9:13:31 AM By: Sandre Kitty Entered By: Sandre Kitty on 05/20/2020 09:47:21 -------------------------------------------------------------------------------- Clinic Level of Care Assessment Details Patient Name: Date of Service: [name], [name] 05/20/2020 9:15 A M Medical Record Number: [id number] Patient Account Number: [id number] Date of Birth/Sex: Treating RN: Nov 16, 1951 (69 y.o. [name] Primary Care Golden Gilreath: [name] Other Clinician: Referring Ayoub Arey: Treating Kalin Kyler/Extender: Raiford Noble in Treatment: 49 Clinic Level of Care Assessment Items TOOL 4 Quantity Score X- 1 0 Use when only an EandM is  performed on FOLLOW-UP visit ASSESSMENTS - Nursing Assessment / Reassessment X- 1 10 Reassessment of Co-morbidities (includes updates in patient status) X- 1 5 Reassessment of Adherence to Treatment Plan ASSESSMENTS - Wound and Skin A ssessment / Reassessment X - Simple Wound Assessment / Reassessment - one wound 1 5 []  - 0 Complex Wound Assessment / Reassessment - multiple wounds []  - 0 Dermatologic / Skin Assessment (not related to wound area) ASSESSMENTS - Focused Assessment []  - 0 Circumferential Edema Measurements - multi extremities []  - 0 Nutritional Assessment / Counseling / Intervention []  - 0 Lower Extremity Assessment (monofilament, tuning fork, pulses) []  - 0 Peripheral Arterial Disease Assessment (using hand held doppler) ASSESSMENTS - Ostomy and/or Continence Assessment and Care []  - 0 Incontinence Assessment and Management []  - 0 Ostomy Care Assessment and Management (repouching, etc.) PROCESS - Coordination of Care X - Simple Patient / Family Education for ongoing care 1 15 []  - 0 Complex (extensive) Patient / Family Education for ongoing care X- 1 10 Staff obtains Programmer, systems, Records, T Results / Process Orders est []  - 0 Staff telephones HHA, Nursing Homes / Clarify orders / etc []  - 0 Routine Transfer to another Facility (non-emergent condition) []  - 0 Routine Hospital Admission (non-emergent condition) []  - 0 New Admissions / Biomedical engineer / Ordering NPWT Apligraf, etc. , []  - 0 Emergency Hospital Admission (emergent condition) X- 1 10 Simple Discharge Coordination []  - 0 Complex (extensive) Discharge Coordination PROCESS - Special Needs []  - 0 Pediatric / Minor Patient Management []  - 0 Isolation Patient Management []  - 0 Hearing / Language / Visual special needs []  - 0 Assessment of Community assistance (transportation, D/C planning, etc.) []  - 0 Additional assistance / Altered mentation []  - 0 Support Surface(s) Assessment  (bed, cushion, seat, etc.) INTERVENTIONS - Wound Cleansing / Measurement X - Simple Wound Cleansing - one wound 1 5 []  - 0 Complex Wound Cleansing - multiple  wounds X- 1 5 Wound Imaging (photographs - any number of wounds) []  - 0 Wound Tracing (instead of photographs) X- 1 5 Simple Wound Measurement - one wound []  - 0 Complex Wound Measurement - multiple wounds INTERVENTIONS - Wound Dressings []  - 0 Small Wound Dressing one or multiple wounds []  - 0 Medium Wound Dressing one or multiple wounds X- 1 20 Large Wound Dressing one or multiple wounds []  - 0 Application of Medications - topical []  - 0 Application of Medications - injection INTERVENTIONS - Miscellaneous []  - 0 External ear exam []  - 0 Specimen Collection (cultures, biopsies, blood, body fluids, etc.) []  - 0 Specimen(s) / Culture(s) sent or taken to Lab for analysis []  - 0 Patient Transfer (multiple staff / Civil Service fast streamer / Similar devices) []  - 0 Simple Staple / Suture removal (25 or less) []  - 0 Complex Staple / Suture removal (26 or more) []  - 0 Hypo / Hyperglycemic Management (close monitor of Blood Glucose) []  - 0 Ankle / Brachial Index (ABI) - do not check if billed separately X- 1 5 Vital Signs Has the patient been seen at the hospital within the last three years: Yes Total Score: 95 Level Of Care: New/Established - Level 3 Electronic Signature(s) Signed: 05/22/2020 5:36:53 PM By: Levan Hurst RN, BSN Entered By: Levan Hurst on 05/20/2020 10:49:43 -------------------------------------------------------------------------------- Encounter Discharge Information Details Patient Name: Date of Service: [name] Shines B. 05/20/2020 9:15 A M Medical Record Number: [id number] Patient Account Number: [id number] Date of Birth/Sex: Treating RN: 02/20/1951 (69 y.o. [name] Primary Care Rocko Fesperman: [name] Other Clinician: Referring Jadon Ressler: Treating Chisom Aust/Extender: Raiford Noble in Treatment: 13 Encounter Discharge Information Items Discharge Condition: Stable Ambulatory Status: [place] Discharge Destination: Home Transportation: Private Auto Accompanied By: alone Schedule Follow-up Appointment: Yes Clinical Summary of Care: Patient Declined Electronic Signature(s) Signed: 05/22/2020 5:36:53 PM By: Levan Hurst RN, BSN Entered By: Levan Hurst on 05/20/2020 10:51:00 -------------------------------------------------------------------------------- Wound Assessment Details Patient Name: Date of Service: [name]. 05/20/2020 9:15 A M Medical Record Number: [id number] Patient Account Number: [id number] Date of Birth/Sex: Treating RN: May 02, 1951 (68 y.o. [name]) Carlene Coria Primary Care Keerat Denicola: [name] Other Clinician: Referring Damieon Armendariz: Treating Joslynne Klatt/Extender: Raiford Noble in Treatment: 2 Wound Status Wound Number: [id number] Primary Etiology: Diabetic Wound/Ulcer of the Lower Extremity Wound Location: Right Metatarsal head second Wound Status: Open Wounding Event: Gradually Appeared Date Acquired: 07/23/2019 Weeks Of Treatment: 41 Clustered Wound: No Wound Measurements Length: (cm) 0.4 Width: (cm) 0.5 Depth: (cm) 1.2 Area: (cm) 0.157 Volume: (cm) 0.188 Wound Description Classification: Grade 2 % Reduction in Area: 91.1% % Reduction in Volume: 64.5% Electronic Signature(s) Signed: 05/20/2020 5:17:39 PM By: Carlene Coria RN Signed: 05/29/2020 9:13:31 AM By: Sandre Kitty Entered By: Sandre Kitty on 05/20/2020 09:47:55 -------------------------------------------------------------------------------- Vitals Details Patient Name: Date of Service: [name] Shines B. 05/20/2020 9:15 A M Medical Record Number: [id number] Patient Account Number: [id number] Date of Birth/Sex: Treating RN: 1951-08-02 (68 y.o. [name]) Carlene Coria Primary Care Donye Campanelli: [name] Other Clinician: Referring  Ulis Kaps: Treating Bani Gianfrancesco/Extender: Raiford Noble in Treatment: 49 Vital Signs Time Taken: 09:47 Temperature (F): 98.7 Height (in): 73 Pulse (bpm): 84 Weight (lbs): 202 Respiratory Rate (breaths/min): 18 Body Mass Index (BMI): 26.6 Blood Pressure (mmHg): 109/58 Capillary Blood Glucose (mg/dl): 149 Reference Range: 80 - 120 mg / dl Electronic Signature(s) Signed: 05/29/2020 9:13:31 AM By: Sandre Kitty Entered By: Sandre Kitty on 05/20/2020 09:47:45

## 2020-05-30 LAB — AEROBIC CULTURE W GRAM STAIN (SUPERFICIAL SPECIMEN)

## 2020-06-03 ENCOUNTER — Other Ambulatory Visit (HOSPITAL_COMMUNITY): Payer: Self-pay | Admitting: Internal Medicine

## 2020-06-03 ENCOUNTER — Other Ambulatory Visit: Payer: Self-pay | Admitting: Internal Medicine

## 2020-06-03 ENCOUNTER — Other Ambulatory Visit: Payer: Self-pay

## 2020-06-03 ENCOUNTER — Encounter (HOSPITAL_BASED_OUTPATIENT_CLINIC_OR_DEPARTMENT_OTHER): Payer: Medicare Other | Admitting: Internal Medicine

## 2020-06-03 DIAGNOSIS — E11621 Type 2 diabetes mellitus with foot ulcer: Secondary | ICD-10-CM | POA: Diagnosis not present

## 2020-06-03 DIAGNOSIS — E13621 Other specified diabetes mellitus with foot ulcer: Secondary | ICD-10-CM

## 2020-06-04 NOTE — Progress Notes (Signed)
[name], [name] ([id number]) Visit Report for 06/03/2020 HPI Details Patient Name: Date of Service: [name], [name] 06/03/2020 9:45 A M Medical Record Number: [id number] Patient Account Number: [id number] Date of Birth/Sex: Treating RN: 1951-09-06 (69 y.o. [name]) Carlene Coria Primary Care Provider: [name] Other Clinician: Referring Provider: Treating Provider/Extender: Tanna Savoy in Treatment: 67 History of Present Illness Location: Patient presents with a wound to bilateral feet. Quality: Patient reports experiencing essentially no pain. Severity: Mildly severe wound with no evidence of infection Duration: Patient has had the wound for greater than 2 weeks prior to presenting for treatment HPI Description: The patient is a pleasant 69 yrs old bm here for evaluation of ulcers on the plantar aspect of both feet. 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 He has a left arm graft for access. He has been trying to stay off his feet for weeks but does not seem to have any improvement in the wound. He has been seeing someone at the foot center and was referred to the Wound Care center for further evaluation. 12/30/15 the patient has 3 wounds one over the right first metatarsal head and 2 on the left foot at the left fifth and lleft first metatarsal head. All of these look relatively similar. The one over the left fifth probe to bone I could not prove that any of the others did. I note his MRI in November that did not show osteomyelitis. His peripheral pulses seem robust. All of these underwent surgical debridement to remove callus nonviable skin and subcutaneous tissue 01/06/16; the patient had his sutures removed from the right fifth ray amputation. There may be a small open part of this superiorly but otherwise the incision looks good. Areas over his right first, left first and left fifth metatarsal head all underwent  surgical debridement as a varying degree of callus, skin and nonviable subcutaneous tissue. The area that is most worrisome is the right fifth metatarsal head which has a wound probes precariously close to bone. There is no purulent drainage or erythema 01/13/16; I'm not exactly sure of the status of the right fifth ray amputation site however he follows with Dr. [name] later this week. The area over his right first plantar metatarsal head, left first and fifth plantar metatarsal head are all in the same status. Thick circumferential callus, nonviable subcutaneous tissue. Culture of the left fifth did not culture last week 01/20/16; all of the patient's wounds appear and roughly the same state although his amputation site on the right lateral foot looks better. His wounds over the right first, left first and left fifth metatarsal heads all underwent difficult surgical debridement removing circumferential callus nonviable skin and subcutaneous tissue. There is no overt evidence of infection in these areas. MRI at the end of December of the left foot did not show osteomyelitis, right foot showed osteomyelitis of the right fifth digit he is now status post amputation. 01/27/16 the patient's wounds over his plantar first and fifth metatarsal heads on the left all appear better having been started on a total contact cast last week. They were debridement of circumferential callus and nonviable subcutaneous tissue as was the wound over the first metatarsal head. His surgical incision on the right also had some light surface debridement done. 03/23/2016 -- the patient was doing really well and most of his wounds had almost completely healed but now he came back today with a history of  having a discharge from the area of his right foot on the plantar aspect and also between his first and second toe. He also has had some discharge from the left foot. Addendum: I spoke to the PA [name] at the dialysis  center whose fax number is [phone]. We discussed the infection the patient has and she will put the patient on vancomycin and Fortaz until the final culture report is back. We will fax this as soon as available. 03/30/2016 -- left foot x-ray IMPRESSION:No definitive osteomyelitis noted. X-ray of the right foot -- IMPRESSION: 1. Soft tissue swelling. Prior amputation right fifth digit. No acute or focal bony abnormality identified. If osteomyelitis remains a clinical concern, MRI can be obtained. 2. Peripheral vascular disease. His culture reports have grown an MSSA -- and we will fax this report to his hemodialysis center. He was called in a prescription of oral doxycycline but I have told her not to fill this in as he is already on IV antibiotics. 04/06/2016 -- a few days ago, I spoke to the hemodialysis center nurse who had stopped the IV antibiotics and he was given a prescription for doxycycline 100 mg by mouth twice a day for a week and he is on this at the present time. 05/11/2016 -- he has recently seen his PCP this week and his hemoglobin A1c was 7. He is working on his paperwork to get his orthotic shoes. 05/25/2016 -- -- x-ray of the left foot IMPRESSION:No acute bony abnormality. No radiographic changes of acute osteomyelitis. No change since prior study. X-ray of the right foot -- IMPRESSION: Postsurgical changes are seen involving the fifth toe. No evidence of acute osteomyelitis. he developed a large blister on the medial part of his right foot and this opened out and drain fluid. 06/01/2016 -- he has his MRI to be done this afternoon. 06/15/2016 -- MRI of the left forefoot without contrast shows 2 separate regions of cutaneous and subcutaneous edema and possible ulceration and blistering along the ball of the foot. No obvious osteomyelitis identified. MRI of the right foot showed cutaneous and subcutaneous thickening plantar to the first digit sesamoid with an ulcer crater but no  underlying osteomyelitis is identified. 06/22/2016 -- the right foot plantar ulcer has been draining a lot of seropurulent material for the last few days. 06/30/2016 -- spoke to the dialysis center and I believe I spoke to [place] a PA at the center who discussed with me and agreed to putting Legrand Como on vancomycin until his cultures arrive. On review of his culture report no WBCs were seen or no organisms were seen and the culture was reincubated for better growth. The final report is back and there were no predominant growth including Streptococcus or Staphylococcus. Clinically though he has a lot of drainage from both wounds a lot of undermining and there is further blebs on the left foot towards the interspace between his first and second toe. 08/10/2016 -- the culture from the right foot showed normal skin flora and there was no Staphylococcus aureus was group A streptococcus isolated. 09/21/2016 -- -- MRI of the right foot was done on 09/13/2016 - IMPRESSION: Findings most consistent with acute osteomyelitis throughout the great toe, sesamoid bones and plantar aspect of the head of the first metatarsal. Fluid in the sheath of the flexor tendon of the great toe could be sympathetic but is worrisome for septic tenosynovitis. First MTP joint effusion worrisome for septic joint. He was admitted to the hospital on  09/11/2016 and treated for a fever with vancomycin and cefepime. He was seen by Dr. Bobby Rumpf of infectious disease who recommended 6 weeks treatment with vancomycin and ceftazidime with his hemodialysis and to continue to see as in the wound clinic. Vascular consult was pending. The patient was discharged home on 09/14/2016 and he would continue with IV antibiotics for 6 weeks. 09/28/16 wound appears reasonably healthy. Continuing with total contact cast 10/05/16 wound is smaller and looking healthy. Continue with total contact cast. He continues on IV antibiotics 10/12/2016 -- he  has developed a new wound on the dorsal aspect of his left big toe and this is a superficial injury with no surrounding cellulitis. He has completed 30 days of IV antibiotics and is now ready to start his hyperbaric oxygen therapy as per his insurance company's recommendation. 10/19/2016 -- after the cast was removed on the right side he has got good resolution of his ulceration on the right plantar foot. he has a new wound on the left plantar foot in the region of his fourth metatarsal and this will need sharp debridement. 10/26/2016 -- Xray of the right foot complete: IMPRESSION: Changes consistent with osteomyelitis involving the head of the first metatarsal and base of the first proximal phalanx. The sesamoid bones are also likely involved given their positioning. 11/02/2016 -- there still awaiting insurance clearance for his hyperbaric oxygen therapy and hopefully he will begin treatment soon. 11/09/2016 -- started with hyperbaric oxygen therapy and had some barotrauma to the right ear and this was seen by ENT who was prescribed Afrin drops and would probably continue with HBO and he is scheduled for myringotomy tubes on Friday 11/16/2016 -- he had his myringotomy tubes placed on Friday and has been doing much better after that with some fluid draining out after hyperbaric treatment today. The pain was much better. 11/30/2016 -- over the last 2 days he noticed a swelling and change of color of his right second toe and this had been draining minimal fluid. 12/07/2016 -- x-ray of the right foot -- IMPRESSION:1. Progressive ulceration at the distal aspect of the second digit with significant soft tissue swelling and osseous changes in the distal phalanx compatible with osteomyelitis. 2. Chronic osteomyelitis at the first MTP joint. 3. Ulcerations at the second and third toes as well without definite osseous changes. Osteomyelitis is not excluded. 4. Fifth digit amputation. On 12/01/2016 I spoke to  Dr. Bobby Rumpf, the infectious disease specialist, who kindly agreed to treat this with IV antibiotics and he would call in the order to the dialysis center and this has been discussed in detail with the patient who will make the appropriate arrangements. The patient will also book an appointment as soon as possible to see Dr. [name] in the office. Of note the patient has not been on antibiotics this entire week as the dialysis center did not receive any orders from Dr. [name]. I got in touch with Dr. [name] who tells me the patient has an appointment to see him this coming Wednesday. 12/14/2016 -- the patient is on ceftazidime and vancomycin during his dialysis, and I understand this was put on by his nephrologist Dr. Raliegh Ip possibly after speaking with infectious disease Dr. [name] 12/23/16; patient was on my schedule today for a wound evaluation as he had difficulties with his schedule earlier this week. I note that he is on vancomycin and ceftazidine at dialysis. In spite of this he arrives today with a new wound on the base of  the right second toe this easily probes to bone. The known wound at the tip of the second toe With this area. He also has a superficial area on the medial aspect of the third toe on the side of the DIP. This does not appear to have much depth. The area on the plantar left foot is a deep area but did not probe the bone 12/28/16 -- he has brought in some lab work and the most recent labs done showed a hemoglobin of 12.1 hematocrit of 36.3, neutrophils of 51% WBC count of 5.6, BN of 53, albumin of 4.1 globulin of 3.7, vancomycin 13 g per mL. 01/04/2017 -- he saw Dr. [name] who has recommended a amputation of the right second toe and he is awaiting this date. He sees Dr. Bobby Rumpf of infectious disease tomorrow. The bone culture taken on 12/28/2016 had no growth in 2 days. 01/11/2017 -- was seen by Dr. Bobby Rumpf regarding the management and has recommended a eval  by vascular surgeons. He recommended to continue the antibiotics during his hemodialysis. Xray of the left foot -- IMPRESSION: No acute fracture, dislocation, or osseous erosion identified. 01/18/2017 -- he has his vascular workup later today and he is going to have his right foot second toe amputation this coming Friday by Dr. [name]. We have also put in for a another 30 treatments with hyperbaric oxygen therapy 01/25/2017 -- I have reviewed Dr. Donnetta Hutching is vascular report from last week where he reviewed him and thought his vascular function was good enough to heal his amputation site and no further tests were recommended. He also had his orthopedic related to surgery which is still pending the notes and we will review these next week. 02/01/2017 -- the operative remote of Dr. 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 02/08/2017 --he was seen by Dr. [name] on 02/03/2017 -- patient is on [place]. after a thorough review he had recommended to continue with antibiotics during hemodialysis. The patient was seen by Dr. [name], but I do not find any follow-up note on the electronic medical record. he has removed the dressing over the right foot to remove the sutures and asked him to see him back in a month's time 02/22/2017 -- patient has been febrile and has been having symptoms of the upper respiratory tract infection but has not been checked for the flu and it's been over 4 days now. He says he is feeling better today. Some drainage between his left first and second toe and this needed to be looked at 03/08/2017 -- was seen by Dr. Bobby Rumpf on 03/07/2017 after review he will stop his antibiotics and see him back in a month to see if he is feeling well. 03/15/2017 -- he has completed his course of hyperbaric oxygen therapy and is doing fine with his health otherwise. 03/22/2017 -- his nutritionist at the dialysis center has recommended a protein supplement to help build his collagen and we will prescribe this for him when he has the details 05/03/2017 -- he recently noticed the area on the plantar aspect of his fifth metatarsal head which opened out and had minimal drainage. 05/10/2017 -- -- x-ray  of the left foot -- IMPRESSION:1. No convincing conventional radiographic evidence of active osteomyelitis.2. Active soft tissue ulceration at the tip of the fifth digit, and at the lateral aspect of the foot adjacent the base of the fifth metatarsal. 3. Surgical changes of prior fourth toe amputation. 4. 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 5. Small vessel atherosclerotic vascular calcifications. 6. 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 ======= Readmission after 5 weeks: 06/15/2017 -- the patient returns after 5 weeks having had an MRI done on 05/17/2017 MRI of the left foot without contrast showed soft tissue ulcer overlying the base of the fifth metatarsal. 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 No drainable fluid collection to suggest an abscess. He was in hospital between 05/16/2017 and 05/25/2017 -- and on discharge was asked to follow-up with Dr. [name] and Dr. [name]. He was treated 2 weeks post discharge with vancomycin plus ceftriaxone with hemodialysis and oral metronidazole 500 mg 3 times a day. 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 last hemoglobin A1c was 6.7% He had a postoperative MR of the foot on  05/23/2017 -- which showed no new areas of cortical bone loss, edema or soft tissue ulceration to suggest osteomyelitis. the patient has completed his course of IV antibiotics during dialysis and is to see the infectious disease doctor tomorrow. Dr. [name] had seen him after suture removal and asked him to keep the wound with a dry dressing 06/21/2017 -- he was seen by Dr. Bobby Rumpf of infectious disease on 06/16/2017 -- he stopped his Flagyl and will see him back in 6 weeks. He was asked to follow-up with Dr. [name] and with the wound clinic clinic. 07/05/17; bone biopsy from last time showed acute osteomyelitis. This is from the reminiscent in the left fifth metatarsal. Culture result is apparently still pending [holding for anaerobe}. From my understanding in this case this is been a progressive necrotic wound which is deteriorated markedly over the last 3 weeks since he returned here. He now has a large area of exposed bone which was biopsied and cultured last week. Dr. Graylon Good has put him on vancomycin and Fortaz during his hemodialysis and Flagyl orally. He is to see Dr. [name] next week 07/19/2017 -- the patient was reviewed by Dr. [name] of orthopedics who reviewed the case in detail and agreed with the plan to continue with IV antibiotics, aggressive wound care and hyperbaric oxygen therapy. He would see him back in 3 weeks' time 08/09/2017 -- saw Dr. Bobby Rumpf on 08/03/2017 -he was restarted on his antibiotics for 6 weeks which included vanco, ceftaz and Flagyl. he recommended continuing his antibiotics for 6 weeks and reevaluate his completion date. He would continue with wound care and hyperbaric oxygen therapy. 08/16/2017 -- I understand he will be completing his 6 weeks of antibiotics sometime later this week. 09/19/2017 -- he has been without his wound VAC for the last week due to lack of supplies. He has been packing his wound with silver alginate. 10/04/2017 -- he has an  appointment with Dr. [name] tomorrow and hence we will not apply the wound VAC after his dressing changes today. 10/11/2017 -- he was seen by Dr. Bobby Rumpf on 10/05/2017, noted that the patient was currently off antibiotics, and after thorough review he recommended he follow-up with the wound care  as per plan and no further antibiotics at this point. 11/29/17; patient is arrived for the wound on his left lateral foot.Marland Kitchen He is completed IV antibiotics and 2 rounds of hyperbaric oxygen for treatment of underlying osteomyelitis. He arrives today with a surface on most of the wound area however our intake nurse noted drainage from the superior aspect. This was brought to my attention. 12/13/2017 -- the right plantar foot had a large bleb and once the bleb was opened out a large callus and subcutaneous debris was removed and he has a plantar ulcer near the fourth metatarsal head 12/28/17 on evaluation today patient appears to be doing acutely worse in regard to his left foot. The wound which has been appearing to do better as now open up more deeply there is bone palpable at the base of the wound unfortunately. He tells me that this feels "like it did when he had osteomyelitis previously" he also noted that his second toe on the left foot appears to be doing worse and is swollen there does appear to be some fluid collected underneath. His right foot plantar ulcer appears to be doing somewhat better at this point and there really is no complication at the site currently. No fevers, chills, nausea, or vomiting noted at this time. Patient states that he normally has no pain at this site however. T oday he is having significant pain. 01/03/18; culture done last week showed methicillin sensitive staph aureus and group B strep. He was on [place] however he arrives today with a fever of 101. He has a functional dialysis shunt in his left arm but has had no pain here. No cough. He still makes some urine no dysuria  he did have abdominal pain nausea and diarrhea over the weekend but is not had any diarrhea since yesterday. Otherwise he has no specific complaints 01/05/18; the patient returns today in follow-up for his presentation from 1/8. At that point he was febrile. I gave him some Levaquin adjusted for his dialysis status. He tells me the fever broke that night and it is not likely that this was actually a wound infection. He is gone on to have an MRI of the left foot; this showed interval development of an abnormal signal from the base of the third and fourth metatarsal and in the cuboid reminiscent consistent with osteomyelitis. There is no mention of the left second toe I think which was a concern when it was ordered. The patient is taking his Levaquin 01/10/18; the patient has completed his antibiotics today. The area on the left lateral foot is smaller but it still probes easily to bone. He has underlying osteomyelitis here and sees Dr. Megan Salon of infectious disease this week The area on the right third plantar metatarsal head still requires debridement not much change in dimensions He has a new wound on the medial tip of his right third toe again this probes to bone. He only noticed this 2 days ago 01/17/18; the patient saw Dr. [name] last week and he is back on Vanco and Fortaz at dialysis. 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 We are previously seeing him for a wound also on the base of the fourth med head on the right. X-ray of this area did not show osteomyelitis in relation to a new wound on the tip of his right third toe. Again this  probes to bone. Finally his second toe on the left which didn't really show anything on the MRI at least no report appears to have a separated cutaneous area which I think is going to come right off the tip of his toe and leave exposed bone. Whether  this is infectious or ischemic I am not clear Culture I did from the third toe last week which was his new wound was negative. Plain x-ray on the right foot did not show osteomyelitis in the toes 01/24/18; the patient is going went to see Dr. [name]. He is going to have a amputation of the left second and right third toe although paradoxically the left second toe looks better than last week. We have treating a deep probing wound on the left lateral foot and the area over the third metatarsal head on the right foot. 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 Also a debridement including bone of the left lateral foot and a closure which is still sutured. This is a bit surprising. Also apparent debridement of the base of the right third toe wound. 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 I'm not sure exactly where we are in the frame of things here. The wound on the left lateral foot is still sutured also the amputation sites of the left second and right third toe. He is been using Xeroform to the sutured areas silver alginate to the right foot 02/14/18; he continues on Rocephin at dialysis for osteomyelitis. I still don't have a good sense of where we are in the treatment duration. The wounds on the left lateral foot had the sutures removed and this clearly still probes deeply. We debrided the area with a #5 curet. We'll use silver alginate to the wound the area over the right third met head also required debridement of callus skin and subcutaneous tissue and necrotic debris over the wound surface. This tunnels superiorly but I did not unroofed this today. The areas on the tip of his toes have a crusted surface eschar I did not debridement this either. 02/21/18; he continues  on Rocephin at dialysis for osteomyelitis. I don't have a good sense of where we are and the treatment duration. The wounds on the left lateral foot is callused over on presentation but requires debridement. The area on the right third med head plantar aspect actually is measuring smaller 02/28/18;patient is on Vanco and Fortaz at dialysis, I'm not sure why I thought this was Rocephin above unless it is been changed. I don't have a good sense of time frame here. Using silver collagen to the area on the left lateral foot and right third med head plantar foot 03/07/18; patient is completing bank and Fortaz at dialysis soon. He is using Silver collagen to the area on the left lateral foot and the right third metatarsal head. He has a smaller superficial wound distal to the left lateral foot wound. 03/14/18-he is here in follow-up evaluation for multiple ulcerations to his bilateral feet. He presents with a new ulceration to the plantar aspect of the left foot underneath the blister, this was deroofed to reveal a partial thickness ulcer. He is voicing no complaints or concerns, tolerated dialysis yesterday. We will continue with same treatment plan and he  will follow-up next week 03/21/18; the patient has a area on the lateral left foot which still has a small probing area. The overall surface area of the wound is better. He presented with a new ulceration on the left foot plantar fifth metatarsal head last week. The area on the right third metatarsal head appears smaller.using silver alginate to all wound areas. The patient is changing the dressing himself. He is using a [place]co forefoot offloading are on the right and a healing sandal on the left 03/28/18; original wound left lateral foot. Still nowhere close to looking like a heeling surface secondary wound on the left lateral foot at the base of his question fifth metatarsal which was new last week Right third metatarsal head. We have been using silver  alginate all wounds 04/04/18; the original wound on the left lateral foot again heavy callus surrounding thick nonviable subcutaneous tissue all requiring debridement. The new wound from 2 weeks ago just near this at the base of the fifth metatarsal head still looks about the same Unfortunately there is been deterioration in the third medical head wound on the right which now probes to bone. I must say this was a superficial wound at one point in time and I really don't have a good frame of reference year. I'll have to go back to look through records about what we know about the right foot. 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 He is also been to see Dr. [name]. Somewhere in here as somebody is ordered a VASCULAR evaluation which was done on 03/22/18. On the right is anterior tibial artery was monophasic triphasic at the posterior tibial artery. On the left anterior tibial artery monophasic posterior tibial artery monophasic. ABI in the right was 1.43 on the left 1.21. TBIs on the right at 0.41 and on the left at 0.32. A vascular consult was recommended and I think has been arranged. 04/11/18; [name] has not had an MRI of the right foot since 2017. Recent x-ray of the right foot done in January and February was negative. However he has had a major deterioration in the wound over the third met head. He is completed antibiotics last week at dialysis [place]. I think he is going to see vascular in follow-up. The areas on the left lateral foot and left plantar fifth metatarsal head both look satisfactory. I debrided both of these areas although the tissue here looks good. The area on the left lateral foot once probe to bone it certainly does not do that now 04/18/18; MRI of the right foot is on Thursday. He has what looks to be serosanguineous purulent drainage coming out of the wound over the  right third metatarsal head today. He completed antibiotics bank and Fortaz 2 weeks ago at dialysis. He has a vascular follow-up with regards to his arterial insufficiency although I don't exactly see when that is booked. The areas on the left foot including the lateral left foot and the plantar left fifth metatarsal head look about the same. 04/25/18-He is here in follow-up evaluation for bilateral foot ulcers. MRI obtained was negative for osteomyelitis. Wound culture was negative. We will continue with same treatment plan he'll follow-up next week 05/02/18; the right plantar foot wound over the third metatarsal head actually looks better than when I last saw this. His MRI was negative for osteomyelitis wound culture was negative. On the left  plantar foot both wounds on the plantar fifth metatarsal head and on the lateral foot both are covered in a very hard circumferential callus. 05/09/18; right plantar foot wound over the third metatarsal head stable from last week. Left plantar foot wound over the fifth metatarsal head also stable but with callus around both wound areas The area on the left lateral foot had thick callus over a surface and I had some thoughts about leaving this intact however it felt boggy. We've been using silver alginate all wounds 05/16/18; since the patient was last here he was hospitalized from 5/15 through 5/19. 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 This grew MRSA. He was placed on vancomycin.MRI of the left foot suggested "progressive" osteomyelitis and bone destruction of the cuboid and the base of the fifth metatarsal. Stable erosive changes of the base of the second metatarsal. I'm wondering if they're aware that he had surgery and debridement in the area of the underlying bone previously by Dr. [name]. In any case, He was also revascularized with an angioplasty  and stenting of the left tibial peroneal trunk and angioplasty of the left posterior tibial artery. He is on vancomycin at dialysis. He has a 2 week follow-up with infectious disease. He has vascular surgery follow-up. He was started on Plavix. 05/23/18; the patient's wound on the lateral left foot at the level of the fifth metatarsal head and the plantar wound on the plantar fifth metatarsal head both look better. The area on the right third metatarsal head still has depth and undermining. We've been using silver alginate. He is on vancomycin. He has been revascularized on the left 05/30/18; he continues on vancomycin at dialysis. Revascularized on the left. The area on the left lateral foot is just about closed. Unfortunately the area over the plantar fifth metatarsal head undermining superiorly as does the area over the right third metatarsal head. Both of these significantly deteriorated from last week. He has appointments next week with infectious disease and orthopedics I delayed putting on a cast on the left until those appointments which therefore we made we'll bring him in on Friday the 14th with the idea of a cast on the left foot 06/06/18; he continues on vancomycin at dialysis. Revascularized on the left. He arrives today with a ballotable swelling just above the area on the left lateral foot where his previous wound was. He has no pain but he is reasonably insensate. The difficult areas on the plantar left fifth metatarsal head looks stable whereas the area on the right third plantar metatarsal head about the same as last week there is undermining here although I did not unroofed this today. He required a considerable debridement of the swelling on the left lateral foot area and I unroofed an abscess with a copious amount of brown purulent material which I obtained for culture. I don't believe during his recent hospitalization he had any further imaging although I need to review this. Previous  cultures from this area done in this clinic showed MRSA, I would be surprised if this is not what this is currently even though he is on vancomycin. 06/13/18; he continues on vancomycin at dialysis however he finishes this on Sunday and then graduates the doxycycline previously prescribed by Dr. [name]. The abscess site on the lateral foot that I unroofed last time grew moderate amounts of methicillin-resistant staph aureus that is  both vancomycin and tetracycline sensitive so we should be okay from that regard. He arrives today in clinic with a connection between the abscess site and the area on the lateral foot. We've been using silver alginate to all his wound areas. The right third metatarsal head wound is measuring smaller. Using silver alginate on both wound areas 06/20/18; transitioning to doxycycline prescribed by Dr. [name]. The abscess site on the lateral foot that I unroofed 2 weeks ago grew MRSA. That area has largely closed down although the lateral part of his wound on the fifth metatarsal head still probes to bone. I suspect all of this was connected. On the right third metatarsal head smaller looking wound but surrounded by nonviable tissue that once again requires debridement using silver alginate on both areas 06/27/18; on doxycycline 100 twice a day. The abscess on the lateral foot has closed down. Although he still has the wound on the left fifth metatarsal head that extends towards the lateral part of the foot. The most lateral part of this wound probes to bone Right third metatarsal head still a deep probing wound with undermining. Nevertheless I elected not to debridement this this week If everything is copious static next week on the left I'm going to attempt a total contact cast 07/04/18; he is tolerating his doxycycline. The abscess on the left lateral foot is closed down although there is still a deep wound here that probes to bone. We will use silver collagen under the total  contact cast Silver collagen to the deep wound over the right third metatarsal head is well 07/11/18; he is tolerating doxycycline. The left lateral fifth plantar metatarsal head still probes deeply but I could not probe any bone. We've been using silver collagen and this will be the second week under the total contact cast Silver collagen to deep wound over the right third metatarsal head as well 07/18/18; he is still taking doxycycline. The left lateral fifth plantar metatarsal this week probes deeply with a large amount of exposed bone. Quite a deterioration. He required extensive debridement. Specimens of bone for pathology and CNS obtained Also considerable debridement on the right third metatarsal head 07/25/18; bone for pathology last week from the lateral left foot showed osteomyelitis. CandS of the bone Enterobacter. And Klebsiella. He is now on ciprofloxacin and the doxycycline is stopped [Dr. [name] of infectious disease] area He has an appointment with Dr. [name] tomorrow I'm going to leave the total contact cast off for this week. May wish to try to reapply that next week or the week after depending on the wound bed looks. I'm not sure if there is an operative option here, previously is followed with Dr. [name] 08/01/18 on evaluation today patient presents for reevaluation. He has been seen by infectious disease, Dr. [name], and he has placed the patient back on Ceftaz currently to be given to him at dialysis three times a week. 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 Fortunately there does not appear to be the evidence of infection at this point in time. Overall the patient has been tolerating the dressing changes without complication. We are no longer due lives in the cast of left foot secondary to the infection obviously. 08/10/18 on evaluation today patient appears to be doing rather well all things considered in  regard to his left plantar foot ulcer. He does have a significant callous on the lateral portion of his  left foot he wonders if I can help clean that away to some degree today. Fortunately he is having no evidence of infection at this time which is good news. No fevers chills noted. With that being said his right plantar foot actually does have a significant callous buildup around the wound opening this seems to not be doing as well as I would like at this point. I do believe that he would benefit from sharp debridement at this site. Subsequently I think a total contact cast would be helpful for the right foot as well. 08/17/18; the patient arrived today with a total contact cast on the right foot. This actually looks quite good. He had gone to see Dr. Megan Salon of infectious disease about the osteomyelitis on the left foot. I think he is on IV Fortaz at dialysis although I am not exactly sure of the rationale for the Fortaz at the time of this dictation. He also arrived in today with a swelling on the lateral left foot this is the site of his original wounds in fact when I first saw this man when he was under Dr. Ardeen Garland care I think this was the site of where the wound was located. It was not particularly tender however using a small scalpel I opened it to [place] some moderate amount of purulent drainage. 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 Last MRI was in May that showed progressive osteomyelitis and bone destruction of the cuboid and base of the fifth metatarsal. Last week he had an abscess over this same area this was removed. Culture was negative. We are continuing with a total contact cast to the area on the third metatarsal head on the right and making some good progress here. The patient asked me again about renal transplant. He is not on the list because of the  open wounds. 08/31/18; apparently the patient had an interruption in the [place] but he is now back on this at dialysis. Apparently there was a misunderstanding and Dr. Crissie Figures orders. Due to have the MRI of the left foot tonight. The area on the right foot continues to have callus thick skin and subcutaneous tissue around the wound edge that requires constant debridement however the wound is smaller we are using a total contact cast in this area. Alginate all wounds 09/07/18; without much surprise the MRI of his left foot showed osteomyelitis in the reminiscent of his fourth metatarsal but also the third metatarsal. She arrives in clinic today with the right foot and a total contact cast. There was purulent drainage coming out of the wound which I have cultured. Marked deterioration here with undermining widely around the wound orifice. Using pickups and a scalpel I remove callus and subcutaneous tissue from a substantial new opening. In a similar fashion the area on the left lateral foot that was blistered last week I have opened this and remove skin and subcutaneous tissue from this area to expose a obvious new wound. I think there is extension and communication between all of this on the left foot. The patient is on Fortaz at dialysis. Culture done of the right foot. We are clearly not making progress here. We have made him an appointment with Dr. [name] of orthopedics. I think an amputation of the left leg may be discussed. I don't think there is anything that can be done with foot  salvage. 09/14/18; culture from the right foot last time showed a very resistant MRSA. This is resistant to doxycycline. I'm going to try to get linezolid at least for a week. He does not have a appointment with Dr. [name] yet. 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 I'll send a message to Dr. [name] of infectious disease. I want the patient to see Dr. [name]  to go over the pros and cons of an amputation which I think will be a BKA. The patient had a question about whether this is curative or not. I told him that I thought it would be although spread of staph aureus infection is not unheard of. MRI of the right foot was done at the end of April 2019 did not show osteomyelitis. The patient last saw Dr. [name] on 9/3. I'll send Dr. [name] a message. I would really like him to weigh the pros and cons of a BKA on the left otherwise he'll probably need IV antibiotics and perhaps hyperbaric oxygen again. He has not had a good response to this in the past. His MRI earlier this month showed progressive damage in the remnants of the fourth and third metatarsal 09/21/2018; sees Dr. [name] of orthopedics next week to discuss a left BKA in response to the osteomyelitis in the left foot. Using silver alginate to all wound areas. He is completing the Zyvox I did put in for him after last culture showed MRSA 09/29/2018; patient saw Dr. [name] of orthopedics discussed the osteomyelitis in the left foot third and fourth metatarsals. He did not recommend urgent surgery but certainly stated the only surgical option would be a BKA. He is communicating with Dr. [name]. The problem here is the instability of the areas on his foot which constantly generate draining abscesses. I will communicate with Dr. [name] about this. He has an appointment on 10/23 10/05/2018; patient sees Dr. [name] on 10/23. I have sent him a secure message not ordered any additional antibiotics for now. Patient continues to have 2 open areas on the left plantar foot at the fifth metatarsal head and the lateral aspect of the foot. These have not changed all that much. The area on the right third met head still has thick callus and surrounding subcutaneous tissue we have been using silver alginate 10/12/2018; patient sees Dr. [name] or colleague on 10/23. 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 The area on the right plantar foot is just about closed. The superior wound on the left fifth metatarsal head is callused over but I am not sure if this is closed. The area below it is about the same. Considerable amount of callus on the lateral foot. We have been using silver alginate to the wounds 10/19/2018; the patient saw Dr. [name] on 10/22. He wishes to try and continue to save the left foot. He has been given 6 weeks of oral ciprofloxacin. We continue to put a total contact cast on the right foot. The area over the fifth metatarsal head on the left is callused over/perhaps healed but I did not remove the callus to find out. He still has the wound on the left lateral midfoot requiring debridement. We have been using silver alginate to all wound areas 10/26/2018 On the left foot our intake nurse noted some purulent drainage from the inferior wound [currently the only one that is not  callused over] On the right foot even though he is in total contact cast a considerable amount of thick black callus and surface eschar. On this side we have been using silver alginate under a total contact cast. he remains on ciprofloxacin as prescribed by Dr. [name] 11/02/2018; culture from last week which is done of the probing area on the left midfoot wound showed MRSA "few". I am going to need to contact Dr. [name] which I will do today I think he is going to need IV antibiotics again. The area on the left foot which was callused on the side is clearly separating today all of this was removed a copious amounts of callus and necrotic subcutaneous tissue. The area on the right plantar foot actually remained healthy looking with a healthy granulated base. We are using silver alginate to all wound areas 11/09/2018; unfortunately neither 1 of the patient's wounds areas looks at all satisfactory. On the left he has considerable necrotic debris from the  plantar wound laterally over the foot. I removed copious amounts of material including callus skin and subcutaneous tissue. On the right foot he arrives with undermining and frankly purulent drainage. Specimen obtained for culture and debridement of the callused skin and subcutaneous tissue from around the circumference. Because of this I cannot put him back in a total contact cast but to be truthful we are really unfortunately not making a lot of progress I did put in a secure text message to Dr. [name] wondering about the MRSA on the left foot. He suggested vancomycin although this is not started. I was left wondering if he expected me to send this into dialysis. Has we have more purulence on the right side wait for that result before calling dialysis 11/16/2018; I called dialysis earlier this week to get the vancomycin started. I think he got the first dose on Tuesday. The patient tells me that he fell earlier this week twisting his left foot and ankle and he is swelling. He continues to not look well. He had blood cultures done at dialysis on Tuesday he is not been informed of the results. 12/07/2018; the patient was admitted to hospital from 11/22/2018 through 12/02/2018. He underwent a left BKA. Apparently had staph sepsis. In the meantime being off the right foot this is closed over. He follows up with Dr. 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 Subsequent to this he actually had multiple falls in fact he tells me to following which calls the wound to be his apparently. He has been seeing Dr. [name] and his physician assistant in the interim. They actually did want him to come back for reevaluation to see if there's anything we can do for me wound care perspective the help this area to heal more appropriately. The patient states that he does not have a tremendous amount of pain  which is good news. No fevers, chills, nausea, or vomiting noted at this time. We have gotten approval from Dr. [name] office had [place] for [place] to treat the patient for his stop wound. This is due to the fact that the patient was in the 90 day postop global. 1/21; the patient does not have an open wound on the right foot although he does have some pressure areas that will need to be padded when he is transferring. The dehisced surgical wound looks clean although there is some undermining of 1.5  cm superiorly. 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 This appears healthy but there is undermining. TheraSkin #1 2/11; TheraSkin #2 2/25; TheraSkin #3. Wound is measuring smaller 3/10; TheraSkin #4 wound is measuring smaller 3/24; TheraSkin #5 wound is measuring smaller and looks healthy. 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 He has no open area on the right foot although he does have some callused area over the original third metatarsal head wound. The third metatarsal is also subluxed on the right foot. I have warned him today that he cannot consider wearing a prosthesis for at least a month but he can go for measurements. He is going to need to keep the right foot padded is much as possible in his diabetic shoe indefinitely READMISSION 06/12/2019 [name] is a type II diabetic on dialysis. He has been in this clinic multiple times with wounds on his bilateral lower extremities. 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 He also has a history of wounds on his right foot but tells me that everything is going well here. He actually came in here with his prosthesis walking with a cane. We were all really quite gratified to see this He states over the last several weeks he has had a  painful area at the tip of the left third finger. His dialysis shunt in his is in the left upper arm and this particularly hurts during dialysis when his fingers get numb and there is a lot of pain in the tip of his left third finger. I believe he is seen vascular surgery. He had a Doppler done to evaluate for a dialysis steal syndrome. He is going for a banding procedure 1 week from today towards the left AV fistula. I think if that is unsuccessful they will be looking at creating a new shunt. He had a course of doxycycline when he which he finished about a week ago. He has been using Bactroban to the finger 6/25; the patient had his banding procedure and things seem to be going better. He is not having pain in his hand at dialysis. The area on the tip of his finger seems about closed. He did however have a paronychia I the last time I saw him. I have been advising him to use topical Bactroban washing the finger. Culture I did last time showed a few staph epidermidis which I was willing to dismiss as superficial skin contaminant. He arrives today with the finger wound looking better however the paronychia a seems worse 7/9; 2-week follow-up. He does not have an open wound on the tip of his third left finger. I thought he had an initial ischemic wound on the tip of the finger as well as a paronychia a medially. All of this appears to be closed. 8/6-Patient returns after 3 weeks for follow-up and has a right second met head plantar callus with a significant area of maceration hiding an ulcer ABI repeated today on right - 1.26 8/13-Patient returns at 1 week, the right second met head plantar wound appears to be slightly better, it is surrounded by the callus area we are using silver alginate 8/20; the patient came back to clinic 2 weeks ago with a wound on the right second metatarsal head. He apparently told  me he developed callus in this area but also went away from his custom shoes to a pair of  running shoes. Using silver alginate. 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 There might be much we can do to offload this although the patient tells me he has a wheelchair that he is using to try and stay off the wound is much as possible 8/27; right second metatarsal head. Still small punched out area with thick callus and subcutaneous tissue around the wound. We have been using silver collagen. This is always been an issue with this man's wound especially on this foot 9/3; right second met head. Still the same punched-out area with thick callus and subcutaneous tissue around the wound removing the circumference demonstrates repetitively undermining area. I have removed all the subcutaneous tissue associated with this. We have been using silver collagen 9/15; right second metatarsal head. Same punched-out thick callus and subcutaneous tissue around the wound area. Still requiring debridement we have been using silver collagen I changed back to silver alginate X-ray last time showed no evidence of osteomyelitis. Culture showed Klebsiella and he was started and Keflex apparently just 4 days ago 9/24; right second metatarsal head. He is completed the antibiotics I gave him for 10 days. Same punched-out thick callus and subcutaneous tissue around the area. Still requiring debridement. We have been using silver alginate. The area itself looks swollen and this could be just [place] that comes on this area with walking on a prosthesis on the left however I think he needs an MRI and I am going to order that today. There is not an option to offload this further 10/1; right second plantar metatarsal head. MRI booked for October 6. Generally looking better today using silver alginate 10/8; right second plantar metatarsal head. MRI that was done on 10/6 was negative for osteomyelitis noted prior amputations of the second and fourth toes at the [facility]. Prior amputation of the third toe to the  base of the middle phalanx. Prior amputation of the fifth toe to the head of the metatarsal. 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 He does not recall about hearing about this. He did not have any pain but then again he has reduced sensation from diabetic neuropathy 10/15; right second plantar metatarsal head. Still in the same condition callus nonviable subcutaneous tissue which cleans up nicely with debridement but reforms by the next week. The patient tells me that he is offloading this is much as he can including taking his wheelchair to dialysis. We have been using silver alginate, changed to silver collagen today 10/22; right second plantar metatarsal head. Wound measures smaller but still thick callus around this wound. Still requiring debridement 11/5 right second plantar metatarsal head. Less callus around the wound circumference but still requiring debridement. There is still some undermining which I cleaned out the wound looks healthy but still considerable punched out depth with a relatively small circumference to the wound there is no palpable bone no purulent drainage 11/12; right second plantar metatarsal head. Small wound with thick callus tissue around the wound and significant undermining. We have been using silver collagen after my continuous debridements of this area 11/19; patient arrives in today with purulent drainage coming out of the wound in the second third met head area on the right foot. Serosanguineous thick drainage. Specimen obtained for culture. 11/21/2019 on evaluation today patient appears to be doing well  with regard to his foot ulcer. The good news is is culture came back negative for any bacteria there was no growth. The other good news is his x-ray also appears to be doing well. The foot is also measuring much better than what it was. Overall I am very pleased with how things seem to be progressing. 12/22;  patient was in [facility] for several weeks due to the death of a family member. He said he stayed off the wound using silver alginate. 01/03/2020. Patient has a small open area with roughly 0.3 cm of circumferential undermining. The orifice looks smaller. We have been using silver collagen. There is not a wound more aggressive way to offload this area as he has a prosthesis on the other leg 1/21; again the same small area is 2 weeks ago. We have been using silver collagen I change that to endoform today I really believe that this is an offloading issue 2/4; the area on the right third met head. We have been using endoform. 2/11 the area on the right third met head we have been using silver alginate. 2/25; the area on the right third met head. There is much less callus on this today. The patient states he is offloading this even more aggressively. As an example he is taking his wheelchair into dialysis on most days 3/4; right third metatarsal head. Again he has eschar over the surface of the wound which I removed with a #3 curette. Some subcutaneous debris. This has 0.8 cm of direct probing depth. I cannot feel any bone here. I did do a culture the area 3/11; right third metatarsal head. Again an eschared area over the wound which almost makes this look superficial. Again debridement reveals a wound with probing depth probably not much different from last week there is no palpable bone no palpable purulence. The patient tells me that he is making every effort to offload this area properly. He is taking wheelchair into dialysis etc. There is no option for forefoot offloading or a total contact cast. We used Oasis #1 today 3/18; again callus and eschar over the wound surface. I remove this but still a probing wound although only 3 mm in depth this time. This is an improvement 3/25; again callus and eschar over the wound surface which I removed the wound is much deeper today at 0.7 cm. There is no  palpable bone. Because of the appearance of this a culture was done. I am not going to put the Oasis back in this. Concerned about underlying infection. Notable for the fact that he is just finished doxycycline for the last go round of this 3/30; still 0.7 cm in depth which is disappointing. Culture I gave last time showed a few Staph aureus which is methicillin susceptible. This should have been taken care of by the recent course of doxycycline I gave him. We are using silver alginate 4/6; almost 1 cm in depth. We have been using silver alginate with underlying Bactroban. 4/15; about 1 cm in depth still. There is no palpable bone but there is undermining thick surface again as usual. Our intake nurse noted what looked to be purulent drainage I suppose that could have been Bactroban but I did a culture for CandS 4/20; depth down 0.7 cm. Culture from last time was negative we have been using Bactroban and silver alginate 4/29; depth down 0.5 cm. Using silver alginate. He reports some drainage 5/6; comes in with the wound worse. Wound is deeper  and tunnels. Measures 0.9 depth and an undermining area of 1.8 cm. 5/18; there is a change the dressing last week. Still using Sorbact hydrogel. What I can see of the base of the wound looks better. 6/1. Not much change. I changed him to endoform today. He has 1.1 cm in depth no palpable bone 6/8; again a small punched out wound with significant undermining. Culture I did last week was negative. T oday he had some purulent drainage perhaps mixed with some retained endoform. Electronic Signature(s) Signed: 06/04/2020 4:17:30 PM By: Linton Ham MD Entered By: Linton Ham on 06/03/2020 12:42:38 -------------------------------------------------------------------------------- Physical Exam Details Patient Name: Date of Service: [name]. 06/03/2020 9:45 A M Medical Record Number: [id number] Patient Account Number: [id number] Date of Birth/Sex:  Treating RN: Mar 21, 1951 (69 y.o. [name] Primary Care Provider: [name] Other Clinician: Referring Provider: Treating Provider/Extender: Tanna Savoy in Treatment: 57 Constitutional Patient is hypertensive.. Pulse regular and within target range for patient.Marland Kitchen Respirations regular, non-labored and within target range.. Temperature is normal and within the target range for the patient.Marland Kitchen Appears in no distress. Notes Wound exam; this again a small punched out wound on the second metatarsal head on his remaining right foot. This has a considerable degree of undermining. Purulent drainage once again with what looks like retained endoform perhaps. There is no palpable bone Electronic Signature(s) Signed: 06/04/2020 4:17:30 PM By: Linton Ham MD Entered By: Linton Ham on 06/03/2020 12:49:14 -------------------------------------------------------------------------------- Physician Orders Details Patient Name: Date of Service: [name]. 06/03/2020 9:45 A M Medical Record Number: [id number] Patient Account Number: [id number] Date of Birth/Sex: Treating RN: [birth year] (69 y.o. 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 Nurse Visit: - 1 week Dressing Change Frequency Wound #41 Right Metatarsal head second Do not change entire dressing for one week. Skin Barriers/Peri-Wound Care Moisturizing lotion - right leg. Wound Cleansing May  shower with protection. - patient to use a cast protector. Primary Wound Dressing Wound #41 Right Metatarsal head second Endoform Secondary Dressing Wound #41 Right Metatarsal head second Dry Gauze Other: - felt to offload periwound. Edema Control Kerlix and Coban - Right Lower Extremity Avoid standing for long periods of time Elevate legs to the level of the heart or above for 30 minutes daily and/or when sitting, a frequency of: - throughout the day. Additional Orders / Instructions Other: - minimize walking and standing on foot to aid in wound healing and callus buildup. Laboratory naerobe culture (MICRO) - PCR culture right foot - (ICD10 E11.621 - Type 2 diabetes mellitus Bacteria identified in Unspecified specimen by A with foot ulcer) LOINC Code: 932-6 Convenience Name: Anerobic culture Radiology Magnetic Resonance Imaging (MRI), right foot 2nd toe no contrast - right foot 2nd toe , no contrast non healing ulcer right 2nd toe - (ICD10 E11.621 - Type 2 diabetes mellitus with foot ulcer) Electronic Signature(s) Signed: 06/03/2020 4:34:33 PM By: Carlene Coria RN Signed: 06/04/2020 4:17:30 PM By: Linton Ham MD Entered By: Carlene Coria on 06/03/2020 13:27:43 Prescription 06/03/2020 -------------------------------------------------------------------------------- Adaline Sill MD Patient Name:  Provider: 1951/05/29 [id number] Date of Birth: NPI#: [name] [id number] Sex: DEA #: [phone] [id number] Phone #: License #: [place] Patient Address: [address] [place], [place] [address] [place], [place] [address] [phone] Allergies Name Reaction Severity penicillin welts, swelling Severe Provider's Orders Magnetic Resonance Imaging (MRI), right foot 2nd toe no contrast - ICD10: E11.621 - right foot 2nd toe , no contrast non healing ulcer right 2nd toe Hand  Signature: Date(s): Electronic Signature(s) Signed: 06/03/2020 4:34:33 PM By: Carlene Coria RN Signed: 06/04/2020 4:17:30 PM By: Linton Ham MD Entered By: Carlene Coria on 06/03/2020 13:27:44 -------------------------------------------------------------------------------- Problem List Details Patient Name: Date of Service: [name] Shines B. 06/03/2020 9:45 A M Medical Record Number: [id number] Patient Account Number: [id number] Date of Birth/Sex: Treating RN: 11-16-1951 (69 y.o. 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, other extremity 06/12/2019 No Yes L03.115 Cellulitis of right lower limb 11/15/2019 No Yes Inactive Problems ICD-10 Code Description Active Date Inactive Date L03.114 Cellulitis of left upper limb 06/12/2019 06/12/2019 L98.498 Non-pressure chronic ulcer of skin of other sites with other specified severity 06/12/2019 06/12/2019 S61.209D Unspecified open wound of unspecified finger without damage to nail, subsequent 06/12/2019 06/12/2019 encounter Resolved Problems Electronic Signature(s) Signed: 06/04/2020 4:17:30 PM By: Linton Ham MD Entered By: Linton Ham on 06/03/2020 12:41:19 -------------------------------------------------------------------------------- Progress Note Details Patient Name: Date of Service: [name] Shines B. 06/03/2020 9:45 A M Medical Record Number: [id number] Patient Account Number: [id number] Date of Birth/Sex: Treating RN: 04/27/1951 (69 y.o. [name] Primary Care Provider: [name] Other  Clinician: Referring Provider: Treating Provider/Extender: Tanna Savoy in Treatment: 51 Subjective History of Present Illness (HPI) The following HPI elements were documented for the patient's wound: Location: Patient presents with a wound to bilateral feet. Quality: Patient reports experiencing essentially no pain. Severity: Mildly severe wound with no evidence of infection Duration: Patient has had the wound for greater than 2 weeks prior to presenting for treatment The patient is a pleasant 69 yrs old bm here for evaluation of ulcers on the plantar aspect of both feet. 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 He has a left arm graft for access. He has been trying to stay off his feet for weeks but does not seem to have any improvement in the wound. He has been seeing someone at the foot center and was referred to the Wound Care center for further evaluation. 12/30/15 the patient has 3 wounds one over the right first metatarsal head and 2 on the left foot at the left fifth and lleft first metatarsal head. All of these look relatively similar. The one over the left fifth probe to bone I could not prove that any of the others did. I note his MRI in November that did not show osteomyelitis. His peripheral pulses seem robust. All of these underwent surgical debridement to remove callus nonviable skin and subcutaneous tissue 01/06/16; the patient had his sutures removed from the right fifth ray amputation. There may be a small open part of this  superiorly but otherwise the incision looks good. Areas over his right first, left first and left fifth metatarsal head all underwent surgical debridement as a varying degree of callus, skin and nonviable subcutaneous tissue. The area that is most worrisome is the right fifth metatarsal head which has a wound probes precariously close to bone. There is no purulent drainage or  erythema 01/13/16; I'm not exactly sure of the status of the right fifth ray amputation site however he follows with Dr. [name] later this week. The area over his right first plantar metatarsal head, left first and fifth plantar metatarsal head are all in the same status. Thick circumferential callus, nonviable subcutaneous tissue. Culture of the left fifth did not culture last week 01/20/16; all of the patient's wounds appear and roughly the same state although his amputation site on the right lateral foot looks better. His wounds over the right first, left first and left fifth metatarsal heads all underwent difficult surgical debridement removing circumferential callus nonviable skin and subcutaneous tissue. There is no overt evidence of infection in these areas. MRI at the end of December of the left foot did not show osteomyelitis, right foot showed osteomyelitis of the right fifth digit he is now status post amputation. 01/27/16 the patient's wounds over his plantar first and fifth metatarsal heads on the left all appear better having been started on a total contact cast last week. They were debridement of circumferential callus and nonviable subcutaneous tissue as was the wound over the first metatarsal head. His surgical incision on the right also had some light surface debridement done. 03/23/2016 -- the patient was doing really well and most of his wounds had almost completely healed but now he came back today with a history of having a discharge from the area of his right foot on the plantar aspect and also between his first and second toe. He also has had some discharge from the left foot. Addendum: I spoke to the PA [name] at the dialysis center whose fax number is [phone]. We discussed the infection the patient has and she will put the patient on vancomycin and Fortaz until the final culture report is back. We will fax this as soon as available. 03/30/2016 -- left foot x-ray  IMPRESSION:No definitive osteomyelitis noted. X-ray of the right foot -- IMPRESSION: 1. Soft tissue swelling. Prior amputation right fifth digit. No acute or focal bony abnormality identified. If osteomyelitis remains a clinical concern, MRI can be obtained. 2. Peripheral vascular disease. His culture reports have grown an MSSA -- and we will fax this report to his hemodialysis center. He was called in a prescription of oral doxycycline but I have told her not to fill this in as he is already on IV antibiotics. 04/06/2016 -- a few days ago, I spoke to the hemodialysis center nurse who had stopped the IV antibiotics and he was given a prescription for doxycycline 100 mg by mouth twice a day for a week and he is on this at the present time. 05/11/2016 -- he has recently seen his PCP this week and his hemoglobin A1c was 7. He is working on his paperwork to get his orthotic shoes. 05/25/2016 -- -- x-ray of the left foot IMPRESSION:No acute bony abnormality. No radiographic changes of acute osteomyelitis. No change since prior study. X-ray of the right foot -- IMPRESSION: Postsurgical changes are seen involving the fifth toe. No evidence of acute osteomyelitis. he developed a large blister on the medial part of his  right foot and this opened out and drain fluid. 06/01/2016 -- he has his MRI to be done this afternoon. 06/15/2016 -- MRI of the left forefoot without contrast shows 2 separate regions of cutaneous and subcutaneous edema and possible ulceration and blistering along the ball of the foot. No obvious osteomyelitis identified. MRI of the right foot showed cutaneous and subcutaneous thickening plantar to the first digit sesamoid with an ulcer crater but no underlying osteomyelitis is identified. 06/22/2016 -- the right foot plantar ulcer has been draining a lot of seropurulent material for the last few days. 06/30/2016 -- spoke to the dialysis center and I believe I spoke to [place] a PA at the  center who discussed with me and agreed to putting Legrand Como on vancomycin until his cultures arrive. On review of his culture report no WBCs were seen or no organisms were seen and the culture was reincubated for better growth. The final report is back and there were no predominant growth including Streptococcus or Staphylococcus. Clinically though he has a lot of drainage from both wounds a lot of undermining and there is further blebs on the left foot towards the interspace between his first and second toe. 08/10/2016 -- the culture from the right foot showed normal skin flora and there was no Staphylococcus aureus was group A streptococcus isolated. 09/21/2016 -- -- MRI of the right foot was done on 09/13/2016 - IMPRESSION: Findings most consistent with acute osteomyelitis throughout the great toe, sesamoid bones and plantar aspect of the head of the first metatarsal. Fluid in the sheath of the flexor tendon of the great toe could be sympathetic but is worrisome for septic tenosynovitis. First MTP joint effusion worrisome for septic joint. He was admitted to the hospital on 09/11/2016 and treated for a fever with vancomycin and cefepime. He was seen by Dr. Bobby Rumpf of infectious disease who recommended 6 weeks treatment with vancomycin and ceftazidime with his hemodialysis and to continue to see as in the wound clinic. Vascular consult was pending. The patient was discharged home on 09/14/2016 and he would continue with IV antibiotics for 6 weeks. 09/28/16 wound appears reasonably healthy. Continuing with total contact cast 10/05/16 wound is smaller and looking healthy. Continue with total contact cast. He continues on IV antibiotics 10/12/2016 -- he has developed a new wound on the dorsal aspect of his left big toe and this is a superficial injury with no surrounding cellulitis. He has completed 30 days of IV antibiotics and is now ready to start his hyperbaric oxygen therapy as per his  insurance company's recommendation. 10/19/2016 -- after the cast was removed on the right side he has got good resolution of his ulceration on the right plantar foot. he has a new wound on the left plantar foot in the region of his fourth metatarsal and this will need sharp debridement. 10/26/2016 -- Xray of the right foot complete: IMPRESSION: Changes consistent with osteomyelitis involving the head of the first metatarsal and base of the first proximal phalanx. The sesamoid bones are also likely involved given their positioning. 11/02/2016 -- there still awaiting insurance clearance for his hyperbaric oxygen therapy and hopefully he will begin treatment soon. 11/09/2016 -- started with hyperbaric oxygen therapy and had some barotrauma to the right ear and this was seen by ENT who was prescribed Afrin drops and would probably continue with HBO and he is scheduled for myringotomy tubes on Friday 11/16/2016 -- he had his myringotomy tubes placed on Friday and has been doing  much better after that with some fluid draining out after hyperbaric treatment today. The pain was much better. 11/30/2016 -- over the last 2 days he noticed a swelling and change of color of his right second toe and this had been draining minimal fluid. 12/07/2016 -- x-ray of the right foot -- IMPRESSION:1. Progressive ulceration at the distal aspect of the second digit with significant soft tissue swelling and osseous changes in the distal phalanx compatible with osteomyelitis. 2. Chronic osteomyelitis at the first MTP joint. 3. Ulcerations at the second and third toes as well without definite osseous changes. Osteomyelitis is not excluded. 4. Fifth digit amputation. On 12/01/2016 oo I spoke to Dr. Bobby Rumpf, the infectious disease specialist, who kindly agreed to treat this with IV antibiotics and he would call in the order to the dialysis center and this has been discussed in detail with the patient who will make the  appropriate arrangements. The patient will also book an appointment as soon as possible to see Dr. [name] in the office. Of note the patient has not been on antibiotics this entire week as the dialysis center did not receive any orders from Dr. [name]. I got in touch with Dr. [name] who tells me the patient has an appointment to see him this coming Wednesday. 12/14/2016 -- the patient is on ceftazidime and vancomycin during his dialysis, and I understand this was put on by his nephrologist Dr. Raliegh Ip possibly after speaking with infectious disease Dr. [name] 12/23/16; patient was on my schedule today for a wound evaluation as he had difficulties with his schedule earlier this week. I note that he is on vancomycin and ceftazidine at dialysis. In spite of this he arrives today with a new wound on the base of the right second toe this easily probes to bone. The known wound at the tip of the second toe With this area. He also has a superficial area on the medial aspect of the third toe on the side of the DIP. This does not appear to have much depth. The area on the plantar left foot is a deep area but did not probe the bone 12/28/16 -- he has brought in some lab work and the most recent labs done showed a hemoglobin of 12.1 hematocrit of 36.3, neutrophils of 51% WBC count of 5.6, BN of 53, albumin of 4.1 globulin of 3.7, vancomycin 13 g per mL. 01/04/2017 -- he saw Dr. [name] who has recommended a amputation of the right second toe and he is awaiting this date. He sees Dr. Bobby Rumpf of infectious disease tomorrow. The bone culture taken on 12/28/2016 had no growth in 2 days. 01/11/2017 -- was seen by Dr. Bobby Rumpf regarding the management and has recommended a eval by vascular surgeons. He recommended to continue the antibiotics during his hemodialysis. Xray of the left foot -- IMPRESSION: No acute fracture, dislocation, or osseous erosion identified. 01/18/2017 -- he has his vascular workup  later today and he is going to have his right foot second toe amputation this coming Friday by Dr. [name]. We have also put in for a another 30 treatments with hyperbaric oxygen therapy 01/25/2017 -- I have reviewed Dr. Donnetta Hutching is vascular report from last week where he reviewed him and thought his vascular function was good enough to heal his amputation site and no further tests were recommended. He also had his orthopedic related to surgery which is still pending the notes and we will review these next week. 02/01/2017 -- the  operative remote of Dr. 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 02/08/2017 --he was seen by Dr. [name] on 02/03/2017 -- patient is on [place]. after a thorough review he had recommended to continue with antibiotics during hemodialysis. The patient was seen by Dr. [name], but I do not find any follow-up note on the electronic medical record. he has removed the dressing over the right foot to remove the sutures and asked him to see him back in a month's time 02/22/2017 -- patient has been febrile and has been having symptoms of the upper respiratory tract infection but has not been checked for the flu and it's been over 4 days now. He says he is feeling better today. Some drainage between his left first and second toe and this needed to be looked at 03/08/2017 -- was seen by Dr. Bobby Rumpf on 03/07/2017 after review he will stop his antibiotics and see him back in a month to see if he is feeling well. 03/15/2017 -- he has completed his course of hyperbaric oxygen therapy and is doing fine with his health  otherwise. 03/22/2017 -- his nutritionist at the dialysis center has recommended a protein supplement to help build his collagen and we will prescribe this for him when he has the details 05/03/2017 -- he recently noticed the area on the plantar aspect of his fifth metatarsal head which opened out and had minimal drainage. 05/10/2017 -- -- x-ray of the left foot -- IMPRESSION:1. No convincing conventional radiographic evidence of active osteomyelitis.2. Active soft tissue ulceration at the tip of the fifth digit, and at the lateral aspect of the foot adjacent the base of the fifth metatarsal. 3. Surgical changes of prior fourth toe amputation. 4. 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 5. Small vessel atherosclerotic vascular calcifications. 6. 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 ======= Readmission after 5 weeks: 06/15/2017 -- the patient returns after 5 weeks having had an MRI done on 05/17/2017 MRI of the left foot without contrast showed soft tissue ulcer overlying the base of the fifth metatarsal. 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 No drainable fluid collection to suggest an abscess. He was in hospital between 05/16/2017 and 05/25/2017 -- and on discharge was asked to follow-up with Dr. [name] and Dr. [name]. He was treated 2 weeks post discharge with vancomycin plus ceftriaxone with hemodialysis and oral metronidazole 500 mg 3 times a day. 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 last hemoglobin A1c was 6.7% He had a postoperative MR of the foot on 05/23/2017 -- which showed no new areas of cortical bone loss, edema or soft tissue ulceration to suggest osteomyelitis. the patient has completed his course of IV antibiotics during dialysis and is to see the infectious disease  doctor tomorrow. Dr. [name] had seen him after suture removal and asked him to keep the wound with a dry dressing 06/21/2017 -- he was seen by Dr. Bobby Rumpf of infectious disease on 06/16/2017 -- he stopped his Flagyl and will see  him back in 6 weeks. He was asked to follow-up with Dr. [name] and with the wound clinic clinic. 07/05/17; bone biopsy from last time showed acute osteomyelitis. This is from the reminiscent in the left fifth metatarsal. Culture result is apparently still pending [holding for anaerobe}. From my understanding in this case this is been a progressive necrotic wound which is deteriorated markedly over the last 3 weeks since he returned here. He now has a large area of exposed bone which was biopsied and cultured last week. Dr. Graylon Good has put him on vancomycin and Fortaz during his hemodialysis and Flagyl orally. He is to see Dr. [name] next week 07/19/2017 -- the patient was reviewed by Dr. [name] of orthopedics who reviewed the case in detail and agreed with the plan to continue with IV antibiotics, aggressive wound care and hyperbaric oxygen therapy. He would see him back in 3 weeks' time 08/09/2017 -- saw Dr. Bobby Rumpf on 08/03/2017 -he was restarted on his antibiotics for 6 weeks which included vanco, ceftaz and Flagyl. he recommended continuing his antibiotics for 6 weeks and reevaluate his completion date. He would continue with wound care and hyperbaric oxygen therapy. 08/16/2017 -- I understand he will be completing his 6 weeks of antibiotics sometime later this week. 09/19/2017 -- he has been without his wound VAC for the last week due to lack of supplies. He has been packing his wound with silver alginate. 10/04/2017 -- he has an appointment with Dr. [name] tomorrow and hence we will not apply the wound VAC after his dressing changes today. 10/11/2017 -- he was seen by Dr. Bobby Rumpf on 10/05/2017, noted that the patient was currently off antibiotics,  and after thorough review he recommended he follow-up with the wound care as per plan and no further antibiotics at this point. 11/29/17; patient is arrived for the wound on his left lateral foot.Marland Kitchen He is completed IV antibiotics and 2 rounds of hyperbaric oxygen for treatment of underlying osteomyelitis. He arrives today with a surface on most of the wound area however our intake nurse noted drainage from the superior aspect. This was brought to my attention. 12/13/2017 -- the right plantar foot had a large bleb and once the bleb was opened out a large callus and subcutaneous debris was removed and he has a plantar ulcer near the fourth metatarsal head 12/28/17 on evaluation today patient appears to be doing acutely worse in regard to his left foot. The wound which has been appearing to do better as now open up more deeply there is bone palpable at the base of the wound unfortunately. He tells me that this feels "like it did when he had osteomyelitis previously" he also noted that his second toe on the left foot appears to be doing worse and is swollen there does appear to be some fluid collected underneath. His right foot plantar ulcer appears to be doing somewhat better at this point and there really is no complication at the site currently. No fevers, chills, nausea, or vomiting noted at this time. Patient states that he normally has no pain at this site however. Today he is having significant pain. 01/03/18; culture done last week showed methicillin sensitive staph aureus and group B strep. He was on Septra however he arrives today with a fever of 101. He has a functional dialysis shunt in his left arm but has had no pain here. No cough. He still makes some urine no dysuria he did have abdominal pain nausea and diarrhea  over the weekend but is not had any diarrhea since yesterday. Otherwise he has no specific complaints 01/05/18; the patient returns today in follow-up for his presentation from 1/8. At  that point he was febrile. I gave him some Levaquin adjusted for his dialysis status. He tells me the fever broke that night and it is not likely that this was actually a wound infection. He is gone on to have an MRI of the left foot; this showed interval development of an abnormal signal from the base of the third and fourth metatarsal and in the cuboid reminiscent consistent with osteomyelitis. There is no mention of the left second toe I think which was a concern when it was ordered. The patient is taking his Levaquin 01/10/18; the patient has completed his antibiotics today. The area on the left lateral foot is smaller but it still probes easily to bone. He has underlying osteomyelitis here and sees Dr. Megan Salon of infectious disease this week ooThe area on the right third plantar metatarsal head still requires debridement not much change in dimensions [facility] has a new wound on the medial tip of his right third toe again this probes to bone. He only noticed this 2 days ago 01/17/18; the patient saw Dr. [name] last week and he is back on Vanco and Fortaz at dialysis. 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 We are previously seeing him for a wound also on the base of the fourth med head on the right. X-ray of this area did not show osteomyelitis in relation to a new wound on the tip of his right third toe. Again this probes to bone. Finally his second toe on the left which didn't really show anything on the MRI at least no report appears to have a separated cutaneous area which I think is going to come right off the tip of his toe and leave exposed bone. Whether this is infectious or ischemic I am not clear Culture I did from the third toe last week which was his new wound was negative. Plain x-ray on the right foot did not show osteomyelitis in the toes 01/24/18; the patient is going  went to see Dr. [name]. He is going to have a amputation of the left second and right third toe although paradoxically the left second toe looks better than last week. We have treating a deep probing wound on the left lateral foot and the area over the third metatarsal head on the right foot. 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 Also a debridement including bone of the left lateral foot and a closure which is still sutured. This is a bit surprising. Also apparent debridement of the base of the right third toe wound. 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 I'm not sure exactly where we are in the frame of things here. The wound on the left lateral foot is still sutured also the amputation sites of the left second and right third toe. He is been using Xeroform to the sutured areas silver alginate to the right foot 02/14/18; he continues on Rocephin at dialysis for osteomyelitis. I still don't have  a good sense of where we are in the treatment duration. The wounds on the left lateral foot had the sutures removed and this clearly still probes deeply. We debrided the area with a #5 curet. We'll use silver alginate to the wound oothe area over the right third met head also required debridement of callus skin and subcutaneous tissue and necrotic debris over the wound surface. This tunnels superiorly but I did not unroofed this today. ooThe areas on the tip of his toes have a crusted surface eschar I did not debridement this either. 02/21/18; he continues on Rocephin at dialysis for osteomyelitis. I don't have a good sense of where we are and the treatment duration. The wounds on the left lateral foot is callused over on presentation but requires debridement. The area on  the right third med head plantar aspect actually is measuring smaller 02/28/18;patient is on Vanco and Fortaz at dialysis, I'm not sure why I thought this was Rocephin above unless it is been changed. I don't have a good sense of time frame here. Using silver collagen to the area on the left lateral foot and right third med head plantar foot 03/07/18; patient is completing bank and Fortaz at dialysis soon. He is using Silver collagen to the area on the left lateral foot and the right third metatarsal head. He has a smaller superficial wound distal to the left lateral foot wound. 03/14/18-he is here in follow-up evaluation for multiple ulcerations to his bilateral feet. He presents with a new ulceration to the plantar aspect of the left foot underneath the blister, this was deroofed to reveal a partial thickness ulcer. He is voicing no complaints or concerns, tolerated dialysis yesterday. We will continue with same treatment plan and he will follow-up next week 03/21/18; the patient has a area on the lateral left foot which still has a small probing area. The overall surface area of the wound is better. He presented with a new ulceration on the left foot plantar fifth metatarsal head last week. The area on the right third metatarsal head appears smaller.using silver alginate to all wound areas. The patient is changing the dressing himself. He is using a Darco forefoot offloading are on the right and a healing sandal on the left 03/28/18; original wound left lateral foot. Still nowhere close to looking like a heeling surface oosecondary wound on the left lateral foot at the base of his question fifth metatarsal which was new last week ooRight third metatarsal head. ooWe have been using silver alginate all wounds 04/04/18; the original wound on the left lateral foot again heavy callus surrounding thick nonviable subcutaneous tissue all requiring debridement. The new wound from 2 weeks ago just near this at  the base of the fifth metatarsal head still looks about the same ooUnfortunately there is been deterioration in the third medical head wound on the right which now probes to bone. I must say this was a superficial wound at one point in time and I really don't have a good frame of reference year. I'll have to go back to look through records about what we know about the right foot. 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 He is also been to see Dr. [name]. Somewhere in here as somebody is ordered a VASCULAR evaluation which was done on 03/22/18. On the right is anterior tibial artery was monophasic triphasic at the posterior tibial artery. On  the left anterior tibial artery monophasic posterior tibial artery monophasic. ABI in the right was 1.43 on the left 1.21. TBIs on the right at 0.41 and on the left at 0.32. A vascular consult was recommended and I think has been arranged. 04/11/18; Mr. Dygert has not had an MRI of the right foot since 2017. Recent x-ray of the right foot done in January and February was negative. However he has had a major deterioration in the wound over the third met head. He is completed antibiotics last week at dialysis [place]. I think he is going to see vascular in follow-up. The areas on the left lateral foot and left plantar fifth metatarsal head both look satisfactory. I debrided both of these areas although the tissue here looks good. The area on the left lateral foot once probe to bone it certainly does not do that now 04/18/18; MRI of the right foot is on Thursday. He has what looks to be serosanguineous purulent drainage coming out of the wound over the right third metatarsal head today. He completed antibiotics bank and Fortaz 2 weeks ago at dialysis. He has a vascular follow-up with regards to his arterial insufficiency although I don't exactly see when that is  booked. The areas on the left foot including the lateral left foot and the plantar left fifth metatarsal head look about the same. 04/25/18-He is here in follow-up evaluation for bilateral foot ulcers. MRI obtained was negative for osteomyelitis. Wound culture was negative. We will continue with same treatment plan he'll follow-up next week 05/02/18; the right plantar foot wound over the third metatarsal head actually looks better than when I last saw this. His MRI was negative for osteomyelitis wound culture was negative. ooOn the left plantar foot both wounds on the plantar fifth metatarsal head and on the lateral foot both are covered in a very hard circumferential callus. 05/09/18; right plantar foot wound over the third metatarsal head stable from last week. ooLeft plantar foot wound over the fifth metatarsal head also stable but with callus around both wound areas ooThe area on the left lateral foot had thick callus over a surface and I had some thoughts about leaving this intact however it felt boggy. ooWe've been using silver alginate all wounds 05/16/18; since the patient was last here he was hospitalized from 5/15 through 5/19. 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 This grew MRSA. He was placed on vancomycin.MRI of the left foot suggested "progressive" osteomyelitis and bone destruction of the cuboid and the base of the fifth metatarsal. Stable erosive changes of the base of the second metatarsal. I'm wondering if they're aware that he had surgery and debridement in the area of the underlying bone previously by Dr. [name]. In any case, He was also revascularized with an angioplasty and stenting of the left tibial peroneal trunk and angioplasty of the left posterior tibial artery. He is on vancomycin at dialysis. He has a 2 week follow-up with infectious disease. He has vascular  surgery follow-up. He was started on Plavix. 05/23/18; the patient's wound on the lateral left foot at the level of the fifth metatarsal head and the plantar wound on the plantar fifth metatarsal head both look better. The area on the right third metatarsal head still has depth and undermining. We've been using silver alginate. He is on vancomycin. He has been revascularized on the  left 05/30/18; he continues on vancomycin at dialysis. Revascularized on the left. The area on the left lateral foot is just about closed. Unfortunately the area over the plantar fifth metatarsal head undermining superiorly as does the area over the right third metatarsal head. Both of these significantly deteriorated from last week. He has appointments next week with infectious disease and orthopedics I delayed putting on a cast on the left until those appointments which therefore we made we'll bring him in on Friday the 14th with the idea of a cast on the left foot 06/06/18; he continues on vancomycin at dialysis. Revascularized on the left. He arrives today with a ballotable swelling just above the area on the left lateral foot where his previous wound was. He has no pain but he is reasonably insensate. The difficult areas on the plantar left fifth metatarsal head looks stable whereas the area on the right third plantar metatarsal head about the same as last week there is undermining here although I did not unroofed this today. He required a considerable debridement of the swelling on the left lateral foot area and I unroofed an abscess with a copious amount of brown purulent material which I obtained for culture. I don't believe during his recent hospitalization he had any further imaging although I need to review this. Previous cultures from this area done in this clinic showed MRSA, I would be surprised if this is not what this is currently even though he is on vancomycin. 06/13/18; he continues on vancomycin at dialysis  however he finishes this on Sunday and then graduates the doxycycline previously prescribed by Dr. [name]. The abscess site on the lateral foot that I unroofed last time grew moderate amounts of methicillin-resistant staph aureus that is both vancomycin and tetracycline sensitive so we should be okay from that regard. He arrives today in clinic with a connection between the abscess site and the area on the lateral foot. We've been using silver alginate to all his wound areas. The right third metatarsal head wound is measuring smaller. Using silver alginate on both wound areas 06/20/18; transitioning to doxycycline prescribed by Dr. [name]. The abscess site on the lateral foot that I unroofed 2 weeks ago grew MRSA. That area has largely closed down although the lateral part of his wound on the fifth metatarsal head still probes to bone. I suspect all of this was connected. ooOn the right third metatarsal head smaller looking wound but surrounded by nonviable tissue that once again requires debridement using silver alginate on both areas 06/27/18; on doxycycline 100 twice a day. The abscess on the lateral foot has closed down. Although he still has the wound on the left fifth metatarsal head that extends towards the lateral part of the foot. The most lateral part of this wound probes to bone ooRight third metatarsal head still a deep probing wound with undermining. Nevertheless I elected not to debridement this this week ooIf everything is copious static next week on the left I'm going to attempt a total contact cast 07/04/18; he is tolerating his doxycycline. The abscess on the left lateral foot is closed down although there is still a deep wound here that probes to bone. We will use silver collagen under the total contact cast Silver collagen to the deep wound over the right third metatarsal head is well 07/11/18; he is tolerating doxycycline. The left lateral fifth plantar metatarsal head still  probes deeply but I could not probe any bone. We've been using silver collagen  and this will be the second week under the total contact cast ooSilver collagen to deep wound over the right third metatarsal head as well 07/18/18; he is still taking doxycycline. The left lateral fifth plantar metatarsal this week probes deeply with a large amount of exposed bone. Quite a deterioration. He required extensive debridement. Specimens of bone for pathology and CNS obtained ooAlso considerable debridement on the right third metatarsal head 07/25/18; bone for pathology last week from the lateral left foot showed osteomyelitis. CandS of the bone Enterobacter. And Klebsiella. He is now on ciprofloxacin and the doxycycline is stopped [Dr. [name] of infectious disease] area He has an appointment with Dr. [name] tomorrow I'm going to leave the total contact cast off for this week. May wish to try to reapply that next week or the week after depending on the wound bed looks. I'm not sure if there is an operative option here, previously is followed with Dr. [name] 08/01/18 on evaluation today patient presents for reevaluation. He has been seen by infectious disease, Dr. [name], and he has placed the patient back on Ceftaz currently to be given to him at dialysis three times a week. 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 Fortunately there does not appear to be the evidence of infection at this point in time. Overall the patient has been tolerating the dressing changes without complication. We are no longer due lives in the cast of left foot secondary to the infection obviously. 08/10/18 on evaluation today patient appears to be doing rather well all things considered in regard to his left plantar foot ulcer. He does have a significant callous on the lateral portion of his left foot he wonders if I can help clean that away to some degree today.  Fortunately he is having no evidence of infection at this time which is good news. No fevers chills noted. With that being said his right plantar foot actually does have a significant callous buildup around the wound opening this seems to not be doing as well as I would like at this point. I do believe that he would benefit from sharp debridement at this site. Subsequently I think a total contact cast would be helpful for the right foot as well. 08/17/18; the patient arrived today with a total contact cast on the right foot. This actually looks quite good. He had gone to see Dr. Megan Salon of infectious disease about the osteomyelitis on the left foot. I think he is on IV Fortaz at dialysis although I am not exactly sure of the rationale for the Fortaz at the time of this dictation. He also arrived in today with a swelling on the lateral left foot this is the site of his original wounds in fact when I first saw this man when he was under Dr. Ardeen Garland care I think this was the site of where the wound was located. It was not particularly tender however using a small scalpel I opened it to [place] some moderate amount of purulent drainage. 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 Last MRI was in May that showed progressive osteomyelitis and bone destruction of the cuboid and base of the fifth metatarsal. Last week he had an abscess over this same area this was removed. Culture was negative. ooWe are continuing with a  total contact cast to the area on the third metatarsal head on the right and making some good progress here. The patient asked me again about renal transplant. He is not on the list because of the open wounds. 08/31/18; apparently the patient had an interruption in the [place] but he is now back on this at dialysis. Apparently there was a misunderstanding and Dr.  Crissie Figures orders. Due to have the MRI of the left foot tonight. ooThe area on the right foot continues to have callus thick skin and subcutaneous tissue around the wound edge that requires constant debridement however the wound is smaller we are using a total contact cast in this area. Alginate all wounds 09/07/18; without much surprise the MRI of his left foot showed osteomyelitis in the reminiscent of his fourth metatarsal but also the third metatarsal. She arrives in clinic today with the right foot and a total contact cast. There was purulent drainage coming out of the wound which I have cultured. Marked deterioration here with undermining widely around the wound orifice. Using pickups and a scalpel I remove callus and subcutaneous tissue from a substantial new opening. ooIn a similar fashion the area on the left lateral foot that was blistered last week I have opened this and remove skin and subcutaneous tissue from this area to expose a obvious new wound. I think there is extension and communication between all of this on the left foot. ooThe patient is on Fortaz at dialysis. Culture done of the right foot. We are clearly not making progress here. ooWe have made him an appointment with Dr. [name] of orthopedics. I think an amputation of the left leg may be discussed. I don't think there is anything that can be done with foot salvage. 09/14/18; culture from the right foot last time showed a very resistant MRSA. This is resistant to doxycycline. I'm going to try to get linezolid at least for a week. He does not have a appointment with Dr. [name] yet. 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 I'll send a message to Dr. [name] of infectious disease. I want the patient to see Dr. [name] to go over the pros and cons of an amputation which I think will be a BKA. The patient had a question about whether this is curative or not. I told him  that I thought it would be although spread of staph aureus infection is not unheard of. MRI of the right foot was done at the end of April 2019 did not show osteomyelitis. The patient last saw Dr. [name] on 9/3. I'll send Dr. [name] a message. I would really like him to weigh the pros and cons of a BKA on the left otherwise he'll probably need IV antibiotics and perhaps hyperbaric oxygen again. He has not had a good response to this in the past. His MRI earlier this month showed progressive damage in the remnants of the fourth and third metatarsal 09/21/2018; sees Dr. [name] of orthopedics next week to discuss a left BKA in response to the osteomyelitis in the left foot. Using silver alginate to all wound areas. He is completing the Zyvox I did put in for him after last culture showed MRSA 09/29/2018; patient saw Dr. [name] of orthopedics discussed the osteomyelitis in the left foot third and fourth metatarsals. He did not recommend urgent surgery but certainly stated the only surgical option would be a BKA. He is communicating with Dr.  Hatcher. The problem here is the instability of the areas on his foot which constantly generate draining abscesses. I will communicate with Dr. [name] about this. He has an appointment on 10/23 10/05/2018; patient sees Dr. [name] on 10/23. I have sent him a secure message not ordered any additional antibiotics for now. Patient continues to have 2 open areas on the left plantar foot at the fifth metatarsal head and the lateral aspect of the foot. These have not changed all that much. The area on the right third met head still has thick callus and surrounding subcutaneous tissue we have been using silver alginate 10/12/2018; patient sees Dr. [name] or colleague on 10/23. 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 The area on the right plantar foot is just about closed. The superior wound on the left fifth  metatarsal head is callused over but I am not sure if this is closed. The area below it is about the same. Considerable amount of callus on the lateral foot. We have been using silver alginate to the wounds 10/19/2018; the patient saw Dr. [name] on 10/22. He wishes to try and continue to save the left foot. He has been given 6 weeks of oral ciprofloxacin. We continue to put a total contact cast on the right foot. The area over the fifth metatarsal head on the left is callused over/perhaps healed but I did not remove the callus to find out. He still has the wound on the left lateral midfoot requiring debridement. We have been using silver alginate to all wound areas 10/26/2018 ooOn the left foot our intake nurse noted some purulent drainage from the inferior wound [currently the only one that is not callused over] ooOn the right foot even though he is in total contact cast a considerable amount of thick black callus and surface eschar. On this side we have been using silver alginate under a total contact cast. oohe remains on ciprofloxacin as prescribed by Dr. [name] 11/02/2018; culture from last week which is done of the probing area on the left midfoot wound showed MRSA "few". I am going to need to contact Dr. [name] which I will do today I think he is going to need IV antibiotics again. The area on the left foot which was callused on the side is clearly separating today all of this was removed a copious amounts of callus and necrotic subcutaneous tissue. ooThe area on the right plantar foot actually remained healthy looking with a healthy granulated base. ooWe are using silver alginate to all wound areas 11/09/2018; unfortunately neither 1 of the patient's wounds areas looks at all satisfactory. On the left he has considerable necrotic debris from the plantar wound laterally over the foot. I removed copious amounts of material including callus skin and subcutaneous tissue. On the right foot  he arrives with undermining and frankly purulent drainage. Specimen obtained for culture and debridement of the callused skin and subcutaneous tissue from around the circumference. Because of this I cannot put him back in a total contact cast but to be truthful we are really unfortunately not making a lot of progress I did put in a secure text message to Dr. [name] wondering about the MRSA on the left foot. He suggested vancomycin although this is not started. I was left wondering if he expected me to send this into dialysis. Has we have more purulence on the right side wait for that result before calling dialysis  11/16/2018; I called dialysis earlier this week to get the vancomycin started. I think he got the first dose on Tuesday. The patient tells me that he fell earlier this week twisting his left foot and ankle and he is swelling. He continues to not look well. He had blood cultures done at dialysis on Tuesday he is not been informed of the results. 12/07/2018; the patient was admitted to hospital from 11/22/2018 through 12/02/2018. He underwent a left BKA. Apparently had staph sepsis. In the meantime being off the right foot this is closed over. He follows up with Dr. 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 Subsequent to this he actually had multiple falls in fact he tells me to following which calls the wound to be his apparently. He has been seeing Dr. [name] and his physician assistant in the interim. They actually did want him to come back for reevaluation to see if there's anything we can do for me wound care perspective the help this area to heal more appropriately. The patient states that he does not have a tremendous amount of pain which is good news. No fevers, chills, nausea, or vomiting noted at this time. We have gotten approval from Dr. [name] office had [facility] for [place] to treat the patient for his stop wound. This is due to the fact that the patient was in the 90 day postop global. 1/21; the patient does not have an open wound on the right foot although he does have some pressure areas that will need to be padded when he is transferring. The dehisced surgical wound looks clean although there is some undermining of 1.5 cm superiorly. 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 This appears healthy but there is undermining. TheraSkin #1 2/11; TheraSkin #2 2/25; TheraSkin #3. Wound is measuring smaller 3/10; TheraSkin #4 wound is measuring smaller 3/24; TheraSkin #5 wound is measuring smaller and looks healthy. 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 He has no open area on the right foot although he does have some callused area over the original third metatarsal head wound. The third metatarsal is also subluxed on the right foot. I have warned him today that he cannot consider wearing a prosthesis for at least a month but he can go for measurements. He is going to need to keep the right foot padded is much as possible in his diabetic shoe indefinitely READMISSION 06/12/2019 [name] is a type II diabetic on dialysis. He has been in this clinic multiple times with wounds on his bilateral lower extremities. 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 He also has a history of wounds on his right foot but tells me that everything is going well here. He actually came in here with his prosthesis walking with a cane. We were all really quite gratified to see this He states over the last several weeks he has had a painful area at the tip of the left third finger. His dialysis shunt in his is in the left upper arm and this particularly hurts during  dialysis when his fingers get numb and there is a lot of pain in the tip of his  left third finger. I believe he is seen vascular surgery. He had a Doppler done to evaluate for a dialysis steal syndrome. He is going for a banding procedure 1 week from today towards the left AV fistula. I think if that is unsuccessful they will be looking at creating a new shunt. He had a course of doxycycline when he which he finished about a week ago. He has been using Bactroban to the finger 6/25; the patient had his banding procedure and things seem to be going better. He is not having pain in his hand at dialysis. The area on the tip of his finger seems about closed. He did however have a paronychia I the last time I saw him. I have been advising him to use topical Bactroban washing the finger. Culture I did last time showed a few staph epidermidis which I was willing to dismiss as superficial skin contaminant. He arrives today with the finger wound looking better however the paronychia a seems worse 7/9; 2-week follow-up. He does not have an open wound on the tip of his third left finger. I thought he had an initial ischemic wound on the tip of the finger as well as a paronychia a medially. All of this appears to be closed. 8/6-Patient returns after 3 weeks for follow-up and has a right second met head plantar callus with a significant area of maceration hiding an ulcer ABI repeated today on right - 1.26 8/13-Patient returns at 1 week, the right second met head plantar wound appears to be slightly better, it is surrounded by the callus area we are using silver alginate 8/20; the patient came back to clinic 2 weeks ago with a wound on the right second metatarsal head. He apparently told me he developed callus in this area but also went away from his custom shoes to a pair of running shoes. Using silver alginate. 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 There might be much we can do to offload  this although the patient tells me he has a wheelchair that he is using to try and stay off the wound is much as possible 8/27; right second metatarsal head. Still small punched out area with thick callus and subcutaneous tissue around the wound. We have been using silver collagen. This is always been an issue with this man's wound especially on this foot 9/3; right second met head. Still the same punched-out area with thick callus and subcutaneous tissue around the wound removing the circumference demonstrates repetitively undermining area. I have removed all the subcutaneous tissue associated with this. We have been using silver collagen 9/15; right second metatarsal head. Same punched-out thick callus and subcutaneous tissue around the wound area. Still requiring debridement we have been using silver collagen I changed back to silver alginate X-ray last time showed no evidence of osteomyelitis. Culture showed Klebsiella and he was started and Keflex apparently just 4 days ago 9/24; right second metatarsal head. He is completed the antibiotics I gave him for 10 days. Same punched-out thick callus and subcutaneous tissue around the area. Still requiring debridement. We have been using silver alginate. The area itself looks swollen and this could be just [place] that comes on this area with walking on a prosthesis on the left however I think he needs an MRI and I am going to order that today. There is not an option to offload this further 10/1; right second plantar metatarsal head. MRI booked for October 6.  Generally looking better today using silver alginate 10/8; right second plantar metatarsal head. MRI that was done on 10/6 was negative for osteomyelitis noted prior amputations of the second and fourth toes at the [facility]. Prior amputation of the third toe to the base of the middle phalanx. Prior amputation of the fifth toe to the head of the metatarsal. 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 He does not recall about hearing about this. He did not have any pain but then again he has reduced sensation from diabetic neuropathy 10/15; right second plantar metatarsal head. Still in the same condition callus nonviable subcutaneous tissue which cleans up nicely with debridement but reforms by the next week. The patient tells me that he is offloading this is much as he can including taking his wheelchair to dialysis. We have been using silver alginate, changed to silver collagen today 10/22; right second plantar metatarsal head. Wound measures smaller but still thick callus around this wound. Still requiring debridement 11/5 right second plantar metatarsal head. Less callus around the wound circumference but still requiring debridement. There is still some undermining which I cleaned out the wound looks healthy but still considerable punched out depth with a relatively small circumference to the wound there is no palpable bone no purulent drainage 11/12; right second plantar metatarsal head. Small wound with thick callus tissue around the wound and significant undermining. We have been using silver collagen after my continuous debridements of this area 11/19; patient arrives in today with purulent drainage coming out of the wound in the second third met head area on the right foot. Serosanguineous thick drainage. Specimen obtained for culture. 11/21/2019 on evaluation today patient appears to be doing well with regard to his foot ulcer. The good news is is culture came back negative for any bacteria there was no growth. The other good news is his x-ray also appears to be doing well. The foot is also measuring much better than what it was. Overall I am very pleased with how things seem to be progressing. 12/22; patient was in [facility] for several weeks due to the death of a family member. He said he stayed off the wound using silver  alginate. 01/03/2020. Patient has a small open area with roughly 0.3 cm of circumferential undermining. The orifice looks smaller. We have been using silver collagen. There is not a wound more aggressive way to offload this area as he has a prosthesis on the other leg 1/21; again the same small area is 2 weeks ago. We have been using silver collagen I change that to endoform today I really believe that this is an offloading issue 2/4; the area on the right third met head. We have been using endoform. 2/11 the area on the right third met head we have been using silver alginate. 2/25; the area on the right third met head. There is much less callus on this today. The patient states he is offloading this even more aggressively. As an example he is taking his wheelchair into dialysis on most days 3/4; right third metatarsal head. Again he has eschar over the surface of the wound which I removed with a #3 curette. Some subcutaneous debris. This has 0.8 cm of direct probing depth. I cannot feel any bone here. I did do a culture the area 3/11; right third metatarsal head. Again an eschared area over the wound which almost makes this look superficial. Again debridement reveals a wound  with probing depth probably not much different from last week there is no palpable bone no palpable purulence. The patient tells me that he is making every effort to offload this area properly. He is taking wheelchair into dialysis etc. There is no option for forefoot offloading or a total contact cast. We used Oasis #1 today 3/18; again callus and eschar over the wound surface. I remove this but still a probing wound although only 3 mm in depth this time. This is an improvement 3/25; again callus and eschar over the wound surface which I removed the wound is much deeper today at 0.7 cm. There is no palpable bone. Because of the appearance of this a culture was done. I am not going to put the Oasis back in this. Concerned about  underlying infection. Notable for the fact that he is just finished doxycycline for the last go round of this 3/30; still 0.7 cm in depth which is disappointing. Culture I gave last time showed a few Staph aureus which is methicillin susceptible. This should have been taken care of by the recent course of doxycycline I gave him. We are using silver alginate 4/6; almost 1 cm in depth. We have been using silver alginate with underlying Bactroban. 4/15; about 1 cm in depth still. There is no palpable bone but there is undermining thick surface again as usual. Our intake nurse noted what looked to be purulent drainage I suppose that could have been Bactroban but I did a culture for CandS 4/20; depth down 0.7 cm. Culture from last time was negative we have been using Bactroban and silver alginate 4/29; depth down 0.5 cm. Using silver alginate. He reports some drainage 5/6; comes in with the wound worse. Wound is deeper and tunnels. Measures 0.9 depth and an undermining area of 1.8 cm. 5/18; there is a change the dressing last week. Still using Sorbact hydrogel. What I can see of the base of the wound looks better. 6/1. Not much change. I changed him to endoform today. He has 1.1 cm in depth no palpable bone 6/8; again a small punched out wound with significant undermining. Culture I did last week was negative. T oday he had some purulent drainage perhaps mixed with some retained endoform. Objective Constitutional Patient is hypertensive.. Pulse regular and within target range for patient.Marland Kitchen Respirations regular, non-labored and within target range.. Temperature is normal and within the target range for the patient.Marland Kitchen Appears in no distress. Vitals Time Taken: 10:13 AM, Height: 73 in, Weight: 202 lbs, BMI: 26.6, Temperature: 98.8 F, Pulse: 93 bpm, Respiratory Rate: 18 breaths/min, Blood Pressure: 98/62 mmHg, Capillary Blood Glucose: 141 mg/dl. General Notes: Wound exam; this again a small punched out  wound on the second metatarsal head on his remaining right foot. This has a considerable degree of undermining. Purulent drainage once again with what looks like retained endoform perhaps. There is no palpable bone Integumentary (Hair, Skin) Wound #41 status is Open. Original cause of wound was Gradually Appeared. The wound is located on the Right Metatarsal head second. The wound measures 0.3cm length x 0.3cm width x 1.3cm depth; 0.071cm^2 area and 0.092cm^3 volume. There is Fat Layer (Subcutaneous Tissue) Exposed exposed. There is no tunneling noted, however, there is undermining starting at 12:00 and ending at 12:00 with a maximum distance of 1.5cm. There is a small amount of serosanguineous drainage noted. The wound margin is well defined and not attached to the wound base. There is large (67-100%) pink granulation within the wound  bed. There is no necrotic tissue within the wound bed. 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 Nurse Visit: - 1 week Dressing Change Frequency: Wound #41 Right Metatarsal head second: Do not change entire dressing for one week. Skin Barriers/Peri-Wound Care: Moisturizing lotion - right leg. Wound Cleansing: May shower with protection. - patient to use a cast protector. Primary Wound Dressing: Wound #41 Right Metatarsal head second: Endoform Secondary Dressing: Wound #41 Right Metatarsal head second: Dry Gauze Other: - felt to offload periwound. Edema Control: Kerlix and Coban - Right Lower Extremity Avoid standing for long periods of time Elevate legs to the level of the heart or above for 30 minutes daily and/or when sitting, a frequency of: - throughout the day. Additional  Orders / Instructions: Other: - minimize walking and standing on foot to aid in wound healing and callus buildup. Radiology ordered were: Magnetic Resonance Imaging (MRI), right foot 2nd toe no contrast - right foot 2nd toe , no contrast non healing ulcer right 2nd toe 1. I did a PCR culture of the drainage coming out of the wound 2. MRI without contrast of the right foot question osteomyelitis 3. We have Dermagraft approved but I do not want to go ahead with this in the setting of possible ongoing infection Electronic Signature(s) Signed: 06/04/2020 4:17:30 PM By: Linton Ham MD Entered By: Linton Ham on 06/03/2020 12:52:43 -------------------------------------------------------------------------------- SuperBill Details Patient Name: Date of Service: [name] 06/03/2020 Medical Record Number: [id number] Patient Account Number: [id number] Date of Birth/Sex: Treating RN: [birth year] (69 y.o. 

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
## 2020-06-10 ENCOUNTER — Encounter (HOSPITAL_BASED_OUTPATIENT_CLINIC_OR_DEPARTMENT_OTHER): Payer: Medicare Other | Admitting: Internal Medicine

## 2020-06-10 ENCOUNTER — Other Ambulatory Visit: Payer: Self-pay

## 2020-06-10 DIAGNOSIS — E11621 Type 2 diabetes mellitus with foot ulcer: Secondary | ICD-10-CM | POA: Diagnosis not present

## 2020-06-10 NOTE — Progress Notes (Signed)
[name] ([id number]) Visit Report for 06/10/2020 Arrival Information Details Patient Name: Date of Service: [name], [name] 06/10/2020 10:00 A M Medical Record Number: [id number] Patient Account Number: [id number] Date of Birth/Sex: Treating RN: 03-10-1951 (69 y.o. [name], Meta.Reding Primary Care Creston Klas: [name] Other Clinician: Referring [place]: Treating [name] [name]/Extender: Tanna Savoy in Treatment: 52 Visit Information History Since Last Visit Added or deleted any medications: No Patient Arrived: Wheel Chair Any new allergies or adverse reactions: No Arrival Time: 10:00 Had a fall or experienced change in No Accompanied By: self activities of daily living that may affect Transfer Assistance: None risk of falls: Patient Identification Verified: Yes Signs or symptoms of abuse/neglect since last visito No Secondary Verification Process Completed: Yes Hospitalized since last visit: No Patient Requires Transmission-Based Precautions: No Implantable device outside of the clinic excluding No Patient Has Alerts: No cellular tissue based products placed in the center since last visit: Has Dressing in Place as Prescribed: Yes Has Compression in Place as Prescribed: Yes Pain Present Now: No Electronic Signature(s) Signed: 06/10/2020 5:41:07 PM By: Deon Pilling Entered By: Deon Pilling on 06/10/2020 10:51:17 -------------------------------------------------------------------------------- Clinic Level of Care Assessment Details Patient Name: Date of Service: [name] 06/10/2020 10:00 A M Medical Record Number: [id number] Patient Account Number: [id number] Date of Birth/Sex: Treating RN: [birth year] (69 y.o. [name], [name] Primary Care Cyruss Arata: [name] Other Clinician: Referring Abagail Limb: Treating [place] Decoteau/Extender: Tanna Savoy in Treatment: 52 Clinic Level of Care Assessment Items TOOL 4 Quantity  Score X- 1 0 Use when only an EandM is performed on FOLLOW-UP visit ASSESSMENTS - Nursing Assessment / Reassessment X- 1 10 Reassessment of Co-morbidities (includes updates in patient status) X- 1 5 Reassessment of Adherence to Treatment Plan ASSESSMENTS - Wound and Skin A ssessment / Reassessment X - Simple Wound Assessment / Reassessment - one wound 1 5 []  - 0 Complex Wound Assessment / Reassessment - multiple wounds X- 1 10 Dermatologic / Skin Assessment (not related to wound area) ASSESSMENTS - Focused Assessment X- 1 5 Circumferential Edema Measurements - multi extremities X- 1 10 Nutritional Assessment / Counseling / Intervention []  - 0 Lower Extremity Assessment (monofilament, tuning fork, pulses) []  - 0 Peripheral Arterial Disease Assessment (using hand held doppler) ASSESSMENTS - Ostomy and/or Continence Assessment and Care []  - 0 Incontinence Assessment and Management []  - 0 Ostomy Care Assessment and Management (repouching, etc.) PROCESS - Coordination of Care X - Simple Patient / Family Education for ongoing care 1 15 []  - 0 Complex (extensive) Patient / Family Education for ongoing care X- 1 10 Staff obtains Programmer, systems, Records, T Results / Process Orders est []  - 0 Staff telephones HHA, Nursing Homes / Clarify orders / etc []  - 0 Routine Transfer to another Facility (non-emergent condition) []  - 0 Routine Hospital Admission (non-emergent condition) []  - 0 New Admissions / Biomedical engineer / Ordering NPWT Apligraf, etc. , []  - 0 Emergency Hospital Admission (emergent condition) X- 1 10 Simple Discharge Coordination []  - 0 Complex (extensive) Discharge Coordination PROCESS - Special Needs []  - 0 Pediatric / Minor Patient Management []  - 0 Isolation Patient Management []  - 0 Hearing / Language / Visual special needs []  - 0 Assessment of Community assistance (transportation, D/C planning, etc.) []  - 0 Additional assistance / Altered  mentation []  - 0 Support Surface(s) Assessment (bed, cushion, seat, etc.) INTERVENTIONS - Wound Cleansing / Measurement X - Simple Wound Cleansing - one wound 1 5 []  -  0 Complex Wound Cleansing - multiple wounds []  - 0 Wound Imaging (photographs - any number of wounds) []  - 0 Wound Tracing (instead of photographs) X- 1 5 Simple Wound Measurement - one wound []  - 0 Complex Wound Measurement - multiple wounds INTERVENTIONS - Wound Dressings []  - 0 Small Wound Dressing one or multiple wounds []  - 0 Medium Wound Dressing one or multiple wounds X- 1 20 Large Wound Dressing one or multiple wounds []  - 0 Application of Medications - topical []  - 0 Application of Medications - injection INTERVENTIONS - Miscellaneous []  - 0 External ear exam []  - 0 Specimen Collection (cultures, biopsies, blood, body fluids, etc.) []  - 0 Specimen(s) / Culture(s) sent or taken to Lab for analysis []  - 0 Patient Transfer (multiple staff / Civil Service fast streamer / Similar devices) []  - 0 Simple Staple / Suture removal (25 or less) []  - 0 Complex Staple / Suture removal (26 or more) []  - 0 Hypo / Hyperglycemic Management (close monitor of Blood Glucose) []  - 0 Ankle / Brachial Index (ABI) - do not check if billed separately X- 1 5 Vital Signs Has the patient been seen at the hospital within the last three years: Yes Total Score: 115 Level Of Care: New/Established - Level 3 Electronic Signature(s) Signed: 06/10/2020 5:41:07 PM By: Deon Pilling Entered By: Deon Pilling on 06/10/2020 10:54:56 -------------------------------------------------------------------------------- Encounter Discharge Information Details Patient Name: Date of Service: [name] Hones EL B. 06/10/2020 10:00 A M Medical Record Number: [id number] Patient Account Number: [id number] Date of Birth/Sex: Treating RN: 02-Jan-1951 (69 y.o. [name] Primary Care Jelene Albano: [name] Other Clinician: Referring Tamiko Leopard: Treating  Momen Ham/Extender: Tanna Savoy in Treatment: 52 Encounter Discharge Information Items Discharge Condition: Stable Ambulatory Status: Wheelchair Discharge Destination: Home Transportation: Private Auto Accompanied By: self Schedule Follow-up Appointment: Yes Clinical Summary of Care: Electronic Signature(s) Signed: 06/10/2020 5:41:07 PM By: Deon Pilling Entered By: Deon Pilling on 06/10/2020 10:54:20 -------------------------------------------------------------------------------- Patient/Caregiver Education Details Patient Name: Date of Service: Blazier, [name] 6/15/2021andnbsp10:00 [place] Record Number: [id number] Patient Account Number: [id number] Date of Birth/Gender: Treating RN: 01-29-1951 (69 y.o. [name] Primary Care Physician: [name] Other Clinician: Referring Physician: Treating Physician/Extender: Tanna Savoy in Treatment: 61 Education Assessment Education Provided To: Patient Education Topics Provided Wound/Skin Impairment: Handouts: Skin Care Do's and Dont's Methods: Explain/Verbal Responses: Reinforcements needed Electronic Signature(s) Signed: 06/10/2020 5:41:07 PM By: Deon Pilling Entered By: Deon Pilling on 06/10/2020 10:53:58 -------------------------------------------------------------------------------- Wound Assessment Details Patient Name: Date of Service: [name], [name] 06/10/2020 10:00 A M Medical Record Number: [id number] Patient Account Number: [id number] Date of Birth/Sex: Treating RN: [birth year] (69 y.o. [name], Meta.Reding Primary Care Trendon Zaring: [name] Other Clinician: Referring Caliah Kopke: Treating Feleshia Zundel/Extender: Tanna Savoy in Treatment: 83 Wound Status Wound Number: 41 Primary Diabetic Wound/Ulcer of the Lower Extremity Etiology: Wound Location: Right Metatarsal head second Wound Open Wounding Event: Gradually  Appeared Status: Date Acquired: 07/23/2019 Comorbid Cataracts, Anemia, Arrhythmia, Congestive Heart Failure, Weeks Of Treatment: 44 History: Hypertension, Peripheral Arterial Disease, Type II Diabetes, End Clustered Wound: No Stage Renal Disease, History of Burn, Osteoarthritis, Osteomyelitis, Neuropathy Wound Measurements Length: (cm) 0.3 Width: (cm) 0.3 Depth: (cm) 1.3 Area: (cm) 0.071 Volume: (cm) 0.092 % Reduction in Area: 96% % Reduction in Volume: 82.6% Epithelialization: None Tunneling: No Undermining: No Wound Description Classification: Grade 2 Wound Margin: Well defined, not attached Exudate Amount: Medium Exudate Type: Serosanguineous Exudate Color: red, brown Foul Odor After  Cleansing: No Slough/Fibrino No Wound Bed Granulation Amount: Large (67-100%) Exposed Structure Granulation Quality: Pink Fascia Exposed: No Necrotic Amount: None Present (0%) Fat Layer (Subcutaneous Tissue) Exposed: Yes Tendon Exposed: No Muscle Exposed: No Joint Exposed: No Bone Exposed: No Assessment Notes callus to periwound. maceration noted to periwound. Treatment Notes Wound #41 (Right Metatarsal head second) 1. Cleanse With Wound Cleanser Soap and water 2. Periwound Care Moisturizing lotion Skin Prep 3. Primary Dressing Applied Endoform 4. Secondary Dressing Dry Gauze Foam 6. Support Layer Applied Kerlix/Coban Notes foam for extra cushion under coban per patient. Electronic Signature(s) Signed: 06/10/2020 5:41:07 PM By: Deon Pilling Entered By: Deon Pilling on 06/10/2020 10:52:40 -------------------------------------------------------------------------------- Vitals Details Patient Name: Date of Service: [name] Hones EL B. 06/10/2020 10:00 A M Medical Record Number: [id number] Patient Account Number: [id number] Date of Birth/Sex: Treating RN: 1951-04-18 (69 y.o. [name], [name] Primary Care Judah Carchi: [name] Other Clinician: Referring  Harjit Douds: Treating Meeya Goldin/Extender: Tanna Savoy in Treatment: 52 Vital Signs Time Taken: 10:01 Temperature (F): 98.4 Height (in): 73 Pulse (bpm): 82 Weight (lbs): 202 Respiratory Rate (breaths/min): 18 Body Mass Index (BMI): 26.6 Blood Pressure (mmHg): 163/98 Reference Range: 80 - 120 mg / dl Electronic Signature(s) Signed: 06/10/2020 5:41:07 PM By: Deon Pilling Entered By: Deon Pilling on 06/10/2020 10:51:35

## 2020-06-10 NOTE — Progress Notes (Signed)
GAYNOR, FERRERAS ([id number]) Visit Report for 06/10/2020 SuperBill Details Patient Name: Date of Service: DELTON, STELLE 06/10/2020 Medical Record Number: [id number] Patient Account Number: [id number] Date of Birth/Sex: Treating RN: 08/06/1951 (69 y.o. 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

## 2020-06-11 ENCOUNTER — Encounter (HOSPITAL_BASED_OUTPATIENT_CLINIC_OR_DEPARTMENT_OTHER): Payer: Medicare Other | Admitting: Physician Assistant

## 2020-06-16 ENCOUNTER — Ambulatory Visit (HOSPITAL_COMMUNITY)
Admission: RE | Admit: 2020-06-16 | Discharge: 2020-06-16 | Disposition: A | Discharge status: Home / Self Care | Payer: Medicare Other | Source: Ambulatory Visit | Attending: Internal Medicine | Admitting: Internal Medicine

## 2020-06-16 ENCOUNTER — Other Ambulatory Visit: Payer: Self-pay

## 2020-06-16 DIAGNOSIS — E13621 Other specified diabetes mellitus with foot ulcer: Secondary | ICD-10-CM | POA: Diagnosis present

## 2020-06-16 DIAGNOSIS — L97519 Non-pressure chronic ulcer of other part of right foot with unspecified severity: Secondary | ICD-10-CM | POA: Insufficient documentation

## 2020-06-17 ENCOUNTER — Encounter (HOSPITAL_BASED_OUTPATIENT_CLINIC_OR_DEPARTMENT_OTHER): Payer: Medicare Other | Admitting: Internal Medicine

## 2020-06-17 DIAGNOSIS — E11621 Type 2 diabetes mellitus with foot ulcer: Secondary | ICD-10-CM | POA: Diagnosis not present

## 2020-06-18 NOTE — Progress Notes (Addendum)
[name], [name] ([id number]) Visit Report for 06/17/2020 Arrival Information Details Patient Name: Date of Service: [name], [name] 06/17/2020 9:45 A M Medical Record Number: [id number] Patient Account Number: [id number] Date of Birth/Sex: Treating RN: September 25, 1951 (69 y.o. [name], [name] Primary Care Elexa Kivi: [name] Other Clinician: Referring Saranne Crislip: Treating Alveda Vanhorne/Extender: Tanna Savoy in Treatment: 63 Visit Information History Since Last Visit Added or deleted any medications: No Patient Arrived: [name] Any new allergies or adverse reactions: No Arrival Time: 09:58 Had a fall or experienced change in No Accompanied By: alone activities of daily living that may affect Transfer Assistance: None risk of falls: Patient Identification Verified: Yes Signs or symptoms of abuse/neglect since last visito No Secondary Verification Process Completed: Yes Hospitalized since last visit: No Patient Requires Transmission-Based Precautions: No Implantable device outside of the clinic excluding No Patient Has Alerts: No cellular tissue based products placed in the center since last visit: Has Dressing in Place as Prescribed: Yes Has Compression in Place as Prescribed: Yes Pain Present Now: No Electronic Signature(s) Signed: 06/17/2020 5:24:50 PM By: Levan Hurst RN, BSN Entered By: Levan Hurst on 06/17/2020 09:59:27 -------------------------------------------------------------------------------- Compression Therapy Details Patient Name: Date of Service: [name]. 06/17/2020 9:45 A M Medical Record Number: [id number] Patient Account Number: [id number] Date of Birth/Sex: Treating RN: 01-11-1951 (68 y.o. [name] Primary Care Freemon Binford: [name] Other Clinician: Referring Soren Lazarz: Treating Lexianna Weinrich/Extender: Tanna Savoy in Treatment: 53 Compression Therapy Performed for Wound Assessment: Wound #41  Right Metatarsal head second Performed By: Clinician Carlene Coria, RN Compression Type: Double Layer Post Procedure Diagnosis Same as Pre-procedure Electronic Signature(s) Signed: 06/18/2020 4:51:47 PM By: Carlene Coria RN Entered By: Carlene Coria on 06/17/2020 10:56:28 -------------------------------------------------------------------------------- Encounter Discharge Information Details Patient Name: Date of Service: [name] Shines B. 06/17/2020 9:45 A M Medical Record Number: [id number] Patient Account Number: [id number] Date of Birth/Sex: Treating RN: 1951/03/24 (69 y.o. [name] Primary Care Saajan Willmon: [name] Other Clinician: Referring Vertis Bauder: Treating Ami Mally/Extender: Tanna Savoy in Treatment: 64 Encounter Discharge Information Items Discharge Condition: Stable Ambulatory Status: Wheelchair Discharge Destination: Home Transportation: Other Accompanied By: self Schedule Follow-up Appointment: Yes Clinical Summary of Care: Patient Declined Electronic Signature(s) Signed: 06/17/2020 5:14:59 PM By: Kela Millin Entered By: Kela Millin on 06/17/2020 11:28:26 -------------------------------------------------------------------------------- Lower Extremity Assessment Details Patient Name: Date of Service: [name], [name] 06/17/2020 9:45 A M Medical Record Number: [id number] Patient Account Number: [id number] Date of Birth/Sex: Treating RN: 07-Mar-1951 (68 y.o. [name] Primary Care Racquel Arkin: [name] Other Clinician: Referring Vonte Rossin: Treating Nagi Furio/Extender: Tanna Savoy in Treatment: 53 Edema Assessment Assessed: [name]: No] Patrice Paradise: No] Edema: [Left: N] [Right: o] Calf Left: Right: Point of Measurement: cm From Medial Instep cm 36 cm Ankle Left: Right: Point of Measurement: cm From Medial Instep cm 18 cm Vascular Assessment Pulses: Dorsalis Pedis Palpable:  [Right:Yes] Electronic Signature(s) Signed: 06/17/2020 5:24:50 PM By: Levan Hurst RN, BSN Entered By: Levan Hurst on 06/17/2020 10:12:59 -------------------------------------------------------------------------------- Multi Wound Chart Details Patient Name: Date of Service: [name]. 06/17/2020 9:45 A M Medical Record Number: [id number] Patient Account Number: [id number] Date of Birth/Sex: Treating RN: [birth year] (68 y.o. [name] Primary Care Elizer Bostic: [name] Other Clinician: Referring Willena Jeancharles: Treating Mackensey Bolte/Extender: Tanna Savoy in Treatment: 32 Vital Signs Height(in): 73 Pulse(bpm): 80 Weight(lbs): 202 Blood Pressure(mmHg): 114/68 Body Mass Index(BMI): 27 Temperature(F): 98.6 Respiratory Rate(breaths/min): 16 Photos: [41:No Photos Right Metatarsal  head second] [N/A:N/A N/A] Wound Location: [41:Gradually Appeared] [N/A:N/A] Wounding Event: [41:Diabetic Wound/Ulcer of the Lower] [N/A:N/A] Primary Etiology: [41:Extremity Cataracts, Anemia, Arrhythmia,] [N/A:N/A] Comorbid History: [41:Congestive Heart Failure, Hypertension, Peripheral Arterial Disease, Type II Diabetes, End Stage Renal Disease, History of Burn, Osteoarthritis, Osteomyelitis, Neuropathy 07/23/2019] [N/A:N/A] Date Acquired: [41:45] [N/A:N/A] Weeks of Treatment: [41:Open] [N/A:N/A] Wound Status: [41:0.5x0.5x1.1] [N/A:N/A] Measurements L x W x D (cm) [41:0.196] [N/A:N/A] A (cm) : rea [41:0.216] [N/A:N/A] Volume (cm) : [41:88.90%] [N/A:N/A] % Reduction in A rea: [41:59.20%] [N/A:N/A] % Reduction in Volume: [41:6] Position 1 (o'clock): [41:3.5] Maximum Distance 1 (cm): [41:12] Starting Position 1 (o'clock): [41:12] Ending Position 1 (o'clock): [41:1.4] Maximum Distance 1 (cm): [41:Yes] [N/A:N/A] Tunneling: [41:Yes] [N/A:N/A] Undermining: [41:Grade 2] [N/A:N/A] Classification: [41:Medium] [N/A:N/A] Exudate A mount: [41:Serous] [N/A:N/A] Exudate Type:  [41:amber] [N/A:N/A] Exudate Color: [41:Well defined, not attached] [N/A:N/A] Wound Margin: [41:Large (67-100%)] [N/A:N/A] Granulation A mount: [41:Pale] [N/A:N/A] Granulation Quality: [41:None Present (0%)] [N/A:N/A] Necrotic A mount: [41:Fat Layer (Subcutaneous Tissue)] [N/A:N/A] Exposed Structures: [41:Exposed: Yes Fascia: No Tendon: No Muscle: No Joint: No Bone: No None] [N/A:N/A] Epithelialization: [41:Compression Therapy] [N/A:N/A] Treatment Notes Wound #41 (Right Metatarsal head second) 1. Cleanse With Wound Cleanser Soap and water 2. Periwound Care Moisturizing lotion Skin Prep 3. Primary Dressing Applied Calcium Alginate Ag 4. Secondary Dressing Dry Gauze 6. Support Layer Applied Kerlix/Coban 7. Footwear/Offloading device applied Felt/Foam Notes foam for extra cushion under coban per patient. Electronic Signature(s) Signed: 06/17/2020 5:20:04 PM By: Linton Ham MD Signed: 06/18/2020 4:51:47 PM By: Carlene Coria RN Entered By: Linton Ham on 06/17/2020 11:35:09 -------------------------------------------------------------------------------- Multi-Disciplinary Care Plan Details Patient Name: Date of Service: [name], [name] 06/17/2020 9:45 A M Medical Record Number: [id number] Patient Account Number: [id number] Date of Birth/Sex: Treating RN: 1951-06-21 (68 y.o. [name] Primary Care Natalio Salois: [name] Other Clinician: Referring Tex Conroy: Treating Karim Aiello/Extender: Tanna Savoy in Treatment: 53 Active Inactive Wound/Skin Impairment Nursing Diagnoses: Knowledge deficit related to ulceration/compromised skin integrity Goals: Patient/caregiver will verbalize understanding of skin care regimen Date Initiated: 06/12/2019 Target Resolution Date: 06/23/2020 Goal Status: Active Ulcer/skin breakdown will have a volume reduction of 30% by week 4 Date Initiated: 06/12/2019 Date Inactivated: 07/05/2019 Target Resolution Date:  07/13/2019 Goal Status: Met Interventions: Assess patient/caregiver ability to obtain necessary supplies Assess patient/caregiver ability to perform ulcer/skin care regimen upon admission and as needed Assess ulceration(s) every visit Notes: Electronic Signature(s) Signed: 06/18/2020 4:51:47 PM By: Carlene Coria RN Entered By: Carlene Coria on 06/17/2020 10:48:51 -------------------------------------------------------------------------------- Pain Assessment Details Patient Name: Date of Service: [name], [name] 06/17/2020 9:45 A M Medical Record Number: [id number] Patient Account Number: [id number] Date of Birth/Sex: Treating RN: 08-13-1951 (69 y.o. [name] Primary Care Brooklyn Alfredo: [name] Other Clinician: Referring Miasia Crabtree: Treating Adrik Khim/Extender: Tanna Savoy in Treatment: 48 Active Problems Location of Pain Severity and Description of Pain Patient Has Paino No Site Locations Pain Management and Medication Current Pain Management: Electronic Signature(s) Signed: 06/17/2020 5:24:50 PM By: Levan Hurst RN, BSN Entered By: Levan Hurst on 06/17/2020 10:00:08 -------------------------------------------------------------------------------- Patient/Caregiver Education Details Patient Name: Date of Service: [name] 6/22/2021andnbsp9:45 [place] Record Number: [id number] Patient Account Number: [id number] Date of Birth/Gender: Treating RN: Oct 06, 1951 (68 y.o. [name] Primary Care Physician: [name] Other Clinician: Referring Physician: Treating Physician/Extender: Tanna Savoy in Treatment: 73 Education Assessment Education Provided To: Patient Education Topics Provided Wound/Skin Impairment: Methods: Explain/Verbal Responses: State content correctly Electronic Signature(s) Signed: 06/18/2020 4:51:47 PM By: Dolores Lory,  Morey Hummingbird RN Entered By: Carlene Coria on 06/17/2020  10:49:10 -------------------------------------------------------------------------------- Wound Assessment Details Patient Name: Date of Service: [name], [name] 06/17/2020 9:45 A M Medical Record Number: [id number] Patient Account Number: [id number] Date of Birth/Sex: Treating RN: 05/12/1951 (69 y.o. [name] Primary Care Naquan Garman: [name] Other Clinician: Referring Leng Montesdeoca: Treating Josephine Rudnick/Extender: Tanna Savoy in Treatment: 53 Wound Status Wound Number: 41 Primary Diabetic Wound/Ulcer of the Lower Extremity Etiology: Wound Location: Right Metatarsal head second Wound Open Wounding Event: Gradually Appeared Status: Date Acquired: 07/23/2019 Comorbid Cataracts, Anemia, Arrhythmia, Congestive Heart Failure, Weeks Of Treatment: 45 History: Hypertension, Peripheral Arterial Disease, Type II Diabetes, End Clustered Wound: No Stage Renal Disease, History of Burn, Osteoarthritis, Osteomyelitis, Neuropathy Photos Wound Measurements Length: (cm) 0.5 % R Width: (cm) 0.5 % R Depth: (cm) 1.1 Epi Area: (cm) 0.196 Tu Volume: (cm) 0.216 eduction in Area: 88.9% eduction in Volume: 59.2% thelialization: None nneling: Yes Position (o'clock): 6 Maximum Distance: (cm) 3.5 Undermining: Yes Starting Position (o'clock): 12 Ending Position (o'clock): 12 Maximum Distance: (cm) 1.4 Wound Description Classification: Grade 2 Fou Wound Margin: Well defined, not attached Slo Exudate Amount: Medium Exudate Type: Serous Exudate Color: amber l Odor After Cleansing: No ugh/Fibrino No Wound Bed Granulation Amount: Large (67-100%) Exposed Structure Granulation Quality: Pale Fascia Exposed: No Necrotic Amount: None Present (0%) Fat Layer (Subcutaneous Tissue) Exposed: Yes Tendon Exposed: No Muscle Exposed: No Joint Exposed: No Bone Exposed: No Treatment Notes Wound #41 (Right Metatarsal head second) 1. Cleanse With Wound Cleanser Soap and  water 2. Periwound Care Moisturizing lotion Skin Prep 3. Primary Dressing Applied Calcium Alginate Ag 4. Secondary Dressing Dry Gauze 6. Support Layer Applied Kerlix/Coban 7. Footwear/Offloading device applied Felt/Foam Notes foam for extra cushion under coban per patient. Electronic Signature(s) Signed: 06/20/2020 5:12:49 PM By: Minerva Fester Signed: 06/20/2020 5:45:26 PM By: Levan Hurst RN, BSN Previous Signature: 06/17/2020 5:24:50 PM Version By: Levan Hurst RN, BSN Entered By: Minerva Fester on 06/19/2020 13:45:34 -------------------------------------------------------------------------------- [place] Details Patient Name: Date of Service: [name] Shines B. 06/17/2020 9:45 A M Medical Record Number: [id number] Patient Account Number: [id number] Date of Birth/Sex: Treating RN: Jul 31, 1951 (69 y.o. [name] Primary Care Juliauna Stueve: [name] Other Clinician: Referring Barak Bialecki: Treating Lorrene Graef/Extender: Tanna Savoy in Treatment: 53 Vital Signs Time Taken: 09:59 Temperature (F): 98.6 Height (in): 73 Pulse (bpm): 89 Weight (lbs): 202 Respiratory Rate (breaths/min): 16 Body Mass Index (BMI): 26.6 Blood Pressure (mmHg): 114/68 Reference Range: 80 - 120 mg / dl Electronic Signature(s) Signed: 06/17/2020 5:24:50 PM By: Levan Hurst RN, BSN Entered By: Levan Hurst on 06/17/2020 09:59:59

## 2020-06-18 NOTE — Progress Notes (Signed)
LION, FERNANDEZ ([id number]) Visit Report for 06/17/2020 HPI Details Patient Name: Date of Service: [name], [name] 06/17/2020 9:45 A M Medical Record Number: [id number] Patient Account Number: [id number] Date of Birth/Sex: Treating RN: 24-Nov-1951 (69 y.o. [name]) Carlene Coria Primary Care Provider: [name] Other Clinician: Referring Provider: Treating Provider/Extender: Tanna Savoy in Treatment: 15 History of Present Illness Location: Patient presents with a wound to bilateral feet. Quality: Patient reports experiencing essentially no pain. Severity: Mildly severe wound with no evidence of infection Duration: Patient has had the wound for greater than 2 weeks prior to presenting for treatment HPI Description: The patient is a pleasant 69 yrs old bm here for evaluation of ulcers on the plantar aspect of both feet. 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 He has a left arm graft for access. He has been trying to stay off his feet for weeks but does not seem to have any improvement in the wound. He has been seeing someone at the foot center and was referred to the Wound Care center for further evaluation. 12/30/15 the patient has 3 wounds one over the right first metatarsal head and 2 on the left foot at the left fifth and lleft first metatarsal head. All of these look relatively similar. The one over the left fifth probe to bone I could not prove that any of the others did. I note his MRI in November that did not show osteomyelitis. His peripheral pulses seem robust. All of these underwent surgical debridement to remove callus nonviable skin and subcutaneous tissue 01/06/16; the patient had his sutures removed from the right fifth ray amputation. There may be a small open part of this superiorly but otherwise the incision looks good. Areas over his right first, left first and left fifth metatarsal head all  underwent surgical debridement as a varying degree of callus, skin and nonviable subcutaneous tissue. The area that is most worrisome is the right fifth metatarsal head which has a wound probes precariously close to bone. There is no purulent drainage or erythema 01/13/16; I'm not exactly sure of the status of the right fifth ray amputation site however he follows with Dr. [name] later this week. The area over his right first plantar metatarsal head, left first and fifth plantar metatarsal head are all in the same status. Thick circumferential callus, nonviable subcutaneous tissue. Culture of the left fifth did not culture last week 01/20/16; all of the patient's wounds appear and roughly the same state although his amputation site on the right lateral foot looks better. His wounds over the right first, left first and left fifth metatarsal heads all underwent difficult surgical debridement removing circumferential callus nonviable skin and subcutaneous tissue. There is no overt evidence of infection in these areas. MRI at the end of December of the left foot did not show osteomyelitis, right foot showed osteomyelitis of the right fifth digit he is now status post amputation. 01/27/16 the patient's wounds over his plantar first and fifth metatarsal heads on the left all appear better having been started on a total contact cast last week. They were debridement of circumferential callus and nonviable subcutaneous tissue as was the wound over the first metatarsal head. His surgical incision on the right also had some light surface debridement done. 03/23/2016 -- the patient was doing really well and most of his wounds had almost completely healed but now he came back today with a history of  having a discharge from the area of his right foot on the plantar aspect and also between his first and second toe. He also has had some discharge from the left foot. Addendum: I spoke to the PA [name] at the  dialysis center whose fax number is [phone]. We discussed the infection the patient has and she will put the patient on vancomycin and Fortaz until the final culture report is back. We will fax this as soon as available. 03/30/2016 -- left foot x-ray IMPRESSION:No definitive osteomyelitis noted. X-ray of the right foot -- IMPRESSION: 1. Soft tissue swelling. Prior amputation right fifth digit. No acute or focal bony abnormality identified. If osteomyelitis remains a clinical concern, MRI can be obtained. 2. Peripheral vascular disease. His culture reports have grown an MSSA -- and we will fax this report to his hemodialysis center. He was called in a prescription of oral doxycycline but I have told her not to fill this in as he is already on IV antibiotics. 04/06/2016 -- a few days ago, I spoke to the hemodialysis center nurse who had stopped the IV antibiotics and he was given a prescription for doxycycline 100 mg by mouth twice a day for a week and he is on this at the present time. 05/11/2016 -- he has recently seen his PCP this week and his hemoglobin A1c was 7. He is working on his paperwork to get his orthotic shoes. 05/25/2016 -- -- x-ray of the left foot IMPRESSION:No acute bony abnormality. No radiographic changes of acute osteomyelitis. No change since prior study. X-ray of the right foot -- IMPRESSION: Postsurgical changes are seen involving the fifth toe. No evidence of acute osteomyelitis. he developed a large blister on the medial part of his right foot and this opened out and drain fluid. 06/01/2016 -- he has his MRI to be done this afternoon. 06/15/2016 -- MRI of the left forefoot without contrast shows 2 separate regions of cutaneous and subcutaneous edema and possible ulceration and blistering along the ball of the foot. No obvious osteomyelitis identified. MRI of the right foot showed cutaneous and subcutaneous thickening plantar to the first digit sesamoid with an ulcer  crater but no underlying osteomyelitis is identified. 06/22/2016 -- the right foot plantar ulcer has been draining a lot of seropurulent material for the last few days. 06/30/2016 -- spoke to the dialysis center and I believe I spoke to [place] a PA at the center who discussed with me and agreed to putting Legrand Como on vancomycin until his cultures arrive. On review of his culture report no WBCs were seen or no organisms were seen and the culture was reincubated for better growth. The final report is back and there were no predominant growth including Streptococcus or Staphylococcus. Clinically though he has a lot of drainage from both wounds a lot of undermining and there is further blebs on the left foot towards the interspace between his first and second toe. 08/10/2016 -- the culture from the right foot showed normal skin flora and there was no Staphylococcus aureus was group A streptococcus isolated. 09/21/2016 -- -- MRI of the right foot was done on 09/13/2016 - IMPRESSION: Findings most consistent with acute osteomyelitis throughout the great toe, sesamoid bones and plantar aspect of the head of the first metatarsal. Fluid in the sheath of the flexor tendon of the great toe could be sympathetic but is worrisome for septic tenosynovitis. First MTP joint effusion worrisome for septic joint. He was admitted to the hospital on  09/11/2016 and treated for a fever with vancomycin and cefepime. He was seen by Dr. Bobby Rumpf of infectious disease who recommended 6 weeks treatment with vancomycin and ceftazidime with his hemodialysis and to continue to see as in the wound clinic. Vascular consult was pending. The patient was discharged home on 09/14/2016 and he would continue with IV antibiotics for 6 weeks. 09/28/16 wound appears reasonably healthy. Continuing with total contact cast 10/05/16 wound is smaller and looking healthy. Continue with total contact cast. He continues on IV  antibiotics 10/12/2016 -- he has developed a new wound on the dorsal aspect of his left big toe and this is a superficial injury with no surrounding cellulitis. He has completed 30 days of IV antibiotics and is now ready to start his hyperbaric oxygen therapy as per his insurance company's recommendation. 10/19/2016 -- after the cast was removed on the right side he has got good resolution of his ulceration on the right plantar foot. he has a new wound on the left plantar foot in the region of his fourth metatarsal and this will need sharp debridement. 10/26/2016 -- Xray of the right foot complete: IMPRESSION: Changes consistent with osteomyelitis involving the head of the first metatarsal and base of the first proximal phalanx. The sesamoid bones are also likely involved given their positioning. 11/02/2016 -- there still awaiting insurance clearance for his hyperbaric oxygen therapy and hopefully he will begin treatment soon. 11/09/2016 -- started with hyperbaric oxygen therapy and had some barotrauma to the right ear and this was seen by ENT who was prescribed Afrin drops and would probably continue with HBO and he is scheduled for myringotomy tubes on Friday 11/16/2016 -- he had his myringotomy tubes placed on Friday and has been doing much better after that with some fluid draining out after hyperbaric treatment today. The pain was much better. 11/30/2016 -- over the last 2 days he noticed a swelling and change of color of his right second toe and this had been draining minimal fluid. 12/07/2016 -- x-ray of the right foot -- IMPRESSION:1. Progressive ulceration at the distal aspect of the second digit with significant soft tissue swelling and osseous changes in the distal phalanx compatible with osteomyelitis. 2. Chronic osteomyelitis at the first MTP joint. 3. Ulcerations at the second and third toes as well without definite osseous changes. Osteomyelitis is not excluded. 4. Fifth digit  amputation. On 12/01/2016 I spoke to Dr. Bobby Rumpf, the infectious disease specialist, who kindly agreed to treat this with IV antibiotics and he would call in the order to the dialysis center and this has been discussed in detail with the patient who will make the appropriate arrangements. The patient will also book an appointment as soon as possible to see Dr. [name] in the office. Of note the patient has not been on antibiotics this entire week as the dialysis center did not receive any orders from Dr. [name]. I got in touch with Dr. [name] who tells me the patient has an appointment to see him this coming Wednesday. 12/14/2016 -- the patient is on ceftazidime and vancomycin during his dialysis, and I understand this was put on by his nephrologist Dr. Raliegh Ip possibly after speaking with infectious disease Dr. [name] 12/23/16; patient was on my schedule today for a wound evaluation as he had difficulties with his schedule earlier this week. I note that he is on vancomycin and ceftazidine at dialysis. In spite of this he arrives today with a new wound on the base of  the right second toe this easily probes to bone. The known wound at the tip of the second toe With this area. He also has a superficial area on the medial aspect of the third toe on the side of the DIP. This does not appear to have much depth. The area on the plantar left foot is a deep area but did not probe the bone 12/28/16 -- he has brought in some lab work and the most recent labs done showed a hemoglobin of 12.1 hematocrit of 36.3, neutrophils of 51% WBC count of 5.6, BN of 53, albumin of 4.1 globulin of 3.7, vancomycin 13 g per mL. 01/04/2017 -- he saw Dr. [name] who has recommended a amputation of the right second toe and he is awaiting this date. He sees Dr. Bobby Rumpf of infectious disease tomorrow. The bone culture taken on 12/28/2016 had no growth in 2 days. 01/11/2017 -- was seen by Dr. Bobby Rumpf regarding the  management and has recommended a eval by vascular surgeons. He recommended to continue the antibiotics during his hemodialysis. Xray of the left foot -- IMPRESSION: No acute fracture, dislocation, or osseous erosion identified. 01/18/2017 -- he has his vascular workup later today and he is going to have his right foot second toe amputation this coming Friday by Dr. [name]. We have also put in for a another 30 treatments with hyperbaric oxygen therapy 01/25/2017 -- I have reviewed Dr. Donnetta Hutching is vascular report from last week where he reviewed him and thought his vascular function was good enough to heal his amputation site and no further tests were recommended. He also had his orthopedic related to surgery which is still pending the notes and we will review these next week. 02/01/2017 -- the operative remote of Dr. 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 02/08/2017 --he was seen by Dr. [name] on 02/03/2017 -- patient is on [place]. after a thorough review he had recommended to continue with antibiotics during hemodialysis. The patient was seen by Dr. [name], but I do not find any follow-up note on the electronic medical record. he has removed the dressing over the right foot to remove the sutures and asked him to see him back in a month's time 02/22/2017 -- patient has been febrile and has been having symptoms of the upper respiratory tract infection but has not been checked for the flu and it's been over 4 days now. He says he is feeling better today. Some drainage between his left first and second toe and this needed to be looked  at 03/08/2017 -- was seen by Dr. Bobby Rumpf on 03/07/2017 after review he will stop his antibiotics and see him back in a month to see if he is feeling well. 03/15/2017 -- he has completed his course of hyperbaric oxygen therapy and is doing fine with his health otherwise. 03/22/2017 -- his nutritionist at the dialysis center has recommended a protein supplement to help build his collagen and we will prescribe this for him when he has the details 05/03/2017 -- he recently noticed the area on the plantar aspect of his fifth metatarsal head which opened out and had minimal drainage. 05/10/2017 -- -- x-ray  of the left foot -- IMPRESSION:1. No convincing conventional radiographic evidence of active osteomyelitis.2. Active soft tissue ulceration at the tip of the fifth digit, and at the lateral aspect of the foot adjacent the base of the fifth metatarsal. 3. Surgical changes of prior fourth toe amputation. 4. 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 5. Small vessel atherosclerotic vascular calcifications. 6. 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 ======= Readmission after 5 weeks: 06/15/2017 -- the patient returns after 5 weeks having had an MRI done on 05/17/2017 MRI of the left foot without contrast showed soft tissue ulcer overlying the base of the fifth metatarsal. 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 No drainable fluid collection to suggest an abscess. He was in hospital between 05/16/2017 and 05/25/2017 -- and on discharge was asked to follow-up with Dr. [name] and Dr. [name]. He was treated 2 weeks post discharge with vancomycin plus ceftriaxone with hemodialysis and oral metronidazole 500 mg 3 times a day. 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 last hemoglobin A1c was  6.7% He had a postoperative MR of the foot on 05/23/2017 -- which showed no new areas of cortical bone loss, edema or soft tissue ulceration to suggest osteomyelitis. the patient has completed his course of IV antibiotics during dialysis and is to see the infectious disease doctor tomorrow. Dr. [name] had seen him after suture removal and asked him to keep the wound with a dry dressing 06/21/2017 -- he was seen by Dr. Bobby Rumpf of infectious disease on 06/16/2017 -- he stopped his Flagyl and will see him back in 6 weeks. He was asked to follow-up with Dr. [name] and with the wound clinic clinic. 07/05/17; bone biopsy from last time showed acute osteomyelitis. This is from the reminiscent in the left fifth metatarsal. Culture result is apparently still pending [holding for anaerobe}. From my understanding in this case this is been a progressive necrotic wound which is deteriorated markedly over the last 3 weeks since he returned here. He now has a large area of exposed bone which was biopsied and cultured last week. Dr. Graylon Good has put him on vancomycin and Fortaz during his hemodialysis and Flagyl orally. He is to see Dr. [name] next week 07/19/2017 -- the patient was reviewed by Dr. [name] of orthopedics who reviewed the case in detail and agreed with the plan to continue with IV antibiotics, aggressive wound care and hyperbaric oxygen therapy. He would see him back in 3 weeks' time 08/09/2017 -- saw Dr. Bobby Rumpf on 08/03/2017 -he was restarted on his antibiotics for 6 weeks which included vanco, ceftaz and Flagyl. he recommended continuing his antibiotics for 6 weeks and reevaluate his completion date. He would continue with wound care and hyperbaric oxygen therapy. 08/16/2017 -- I understand he will be completing his 6 weeks of antibiotics sometime later this week. 09/19/2017 -- he has been without his wound VAC for the last week due to lack of supplies. He has been packing his wound  with silver alginate. 10/04/2017 -- he has an appointment with Dr. [name] tomorrow and hence we will not apply the wound VAC after his dressing changes today. 10/11/2017 -- he was seen by Dr. Bobby Rumpf on 10/05/2017, noted that the patient was currently off antibiotics, and after thorough review he recommended he follow-up with the wound care  as per plan and no further antibiotics at this point. 11/29/17; patient is arrived for the wound on his left lateral foot.[name] He is completed IV antibiotics and 2 rounds of hyperbaric oxygen for treatment of underlying osteomyelitis. He arrives today with a surface on most of the wound area however our intake nurse noted drainage from the superior aspect. This was brought to my attention. 12/13/2017 -- the right plantar foot had a large bleb and once the bleb was opened out a large callus and subcutaneous debris was removed and he has a plantar ulcer near the fourth metatarsal head 12/28/17 on evaluation today patient appears to be doing acutely worse in regard to his left foot. The wound which has been appearing to do better as now open up more deeply there is bone palpable at the base of the wound unfortunately. He tells me that this feels "like it did when he had osteomyelitis previously" he also noted that his second toe on the left foot appears to be doing worse and is swollen there does appear to be some fluid collected underneath. His right foot plantar ulcer appears to be doing somewhat better at this point and there really is no complication at the site currently. No fevers, chills, nausea, or vomiting noted at this time. Patient states that he normally has no pain at this site however. T oday he is having significant pain. 01/03/18; culture done last week showed methicillin sensitive staph aureus and group B strep. He was on [place] however he arrives today with a fever of 101. He has a functional dialysis shunt in his left arm but has had no pain here.  No cough. He still makes some urine no dysuria he did have abdominal pain nausea and diarrhea over the weekend but is not had any diarrhea since yesterday. Otherwise he has no specific complaints 01/05/18; the patient returns today in follow-up for his presentation from 1/8. At that point he was febrile. I gave him some Levaquin adjusted for his dialysis status. He tells me the fever broke that night and it is not likely that this was actually a wound infection. He is gone on to have an MRI of the left foot; this showed interval development of an abnormal signal from the base of the third and fourth metatarsal and in the cuboid reminiscent consistent with osteomyelitis. There is no mention of the left second toe I think which was a concern when it was ordered. The patient is taking his Levaquin 01/10/18; the patient has completed his antibiotics today. The area on the left lateral foot is smaller but it still probes easily to bone. He has underlying osteomyelitis here and sees Dr. Megan Salon of infectious disease this week The area on the right third plantar metatarsal head still requires debridement not much change in dimensions He has a new wound on the medial tip of his right third toe again this probes to bone. He only noticed this 2 days ago 01/17/18; the patient saw Dr. [name] last week and he is back on Vanco and Fortaz at dialysis. 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 We are previously seeing him for a wound also on the base of the fourth med head on the right. X-ray of this area did not show osteomyelitis in relation to a new wound on the tip of his right third toe. Again this  probes to bone. Finally his second toe on the left which didn't really show anything on the MRI at least no report appears to have a separated cutaneous area which I think is going to come right off the tip  of his toe and leave exposed bone. Whether this is infectious or ischemic I am not clear Culture I did from the third toe last week which was his new wound was negative. Plain x-ray on the right foot did not show osteomyelitis in the toes 01/24/18; the patient is going went to see Dr. [name]. He is going to have a amputation of the left second and right third toe although paradoxically the left second toe looks better than last week. We have treating a deep probing wound on the left lateral foot and the area over the third metatarsal head on the right foot. 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 Also a debridement including bone of the left lateral foot and a closure which is still sutured. This is a bit surprising. Also apparent debridement of the base of the right third toe wound. 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 I'm not sure exactly where we are in the frame of things here. The wound on the left lateral foot is still sutured also the amputation sites of the left second and right third toe. He is been using Xeroform to the sutured areas silver alginate to the right foot 02/14/18; he continues on Rocephin at dialysis for osteomyelitis. I still don't have a good sense of where we are in the treatment duration. The wounds on the left lateral foot had the sutures removed and this clearly still probes deeply. We debrided the area with a #5 curet. We'll use silver alginate to the wound the area over the right third met head also required debridement of callus skin and subcutaneous tissue and necrotic debris over the wound surface. This tunnels superiorly but I did not unroofed this today. The areas on the tip of his toes have a crusted surface eschar I did not  debridement this either. 02/21/18; he continues on Rocephin at dialysis for osteomyelitis. I don't have a good sense of where we are and the treatment duration. The wounds on the left lateral foot is callused over on presentation but requires debridement. The area on the right third med head plantar aspect actually is measuring smaller 02/28/18;patient is on Vanco and Fortaz at dialysis, I'm not sure why I thought this was Rocephin above unless it is been changed. I don't have a good sense of time frame here. Using silver collagen to the area on the left lateral foot and right third med head plantar foot 03/07/18; patient is completing bank and Fortaz at dialysis soon. He is using Silver collagen to the area on the left lateral foot and the right third metatarsal head. He has a smaller superficial wound distal to the left lateral foot wound. 03/14/18-he is here in follow-up evaluation for multiple ulcerations to his bilateral feet. He presents with a new ulceration to the plantar aspect of the left foot underneath the blister, this was deroofed to reveal a partial thickness ulcer. He is voicing no complaints or concerns, tolerated dialysis yesterday. We will continue with same treatment plan and he  will follow-up next week 03/21/18; the patient has a area on the lateral left foot which still has a small probing area. The overall surface area of the wound is better. He presented with a new ulceration on the left foot plantar fifth metatarsal head last week. The area on the right third metatarsal head appears smaller.using silver alginate to all wound areas. The patient is changing the dressing himself. He is using a Darco forefoot offloading are on the right and a healing sandal on the left 03/28/18; original wound left lateral foot. Still nowhere close to looking like a heeling surface secondary wound on the left lateral foot at the base of his question fifth metatarsal which was new last week Right third  metatarsal head. We have been using silver alginate all wounds 04/04/18; the original wound on the left lateral foot again heavy callus surrounding thick nonviable subcutaneous tissue all requiring debridement. The new wound from 2 weeks ago just near this at the base of the fifth metatarsal head still looks about the same Unfortunately there is been deterioration in the third medical head wound on the right which now probes to bone. I must say this was a superficial wound at one point in time and I really don't have a good frame of reference year. I'll have to go back to look through records about what we know about the right foot. 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 He is also been to see Dr. [name]. Somewhere in here as somebody is ordered a VASCULAR evaluation which was done on 03/22/18. On the right is anterior tibial artery was monophasic triphasic at the posterior tibial artery. On the left anterior tibial artery monophasic posterior tibial artery monophasic. ABI in the right was 1.43 on the left 1.21. TBIs on the right at 0.41 and on the left at 0.32. A vascular consult was recommended and I think has been arranged. 04/11/18; Mr. Duecker has not had an MRI of the right foot since 2017. Recent x-ray of the right foot done in January and February was negative. However he has had a major deterioration in the wound over the third met head. He is completed antibiotics last week at dialysis [place]. I think he is going to see vascular in follow-up. The areas on the left lateral foot and left plantar fifth metatarsal head both look satisfactory. I debrided both of these areas although the tissue here looks good. The area on the left lateral foot once probe to bone it certainly does not do that now 04/18/18; MRI of the right foot is on Thursday. He has what looks to be serosanguineous purulent  drainage coming out of the wound over the right third metatarsal head today. He completed antibiotics bank and Fortaz 2 weeks ago at dialysis. He has a vascular follow-up with regards to his arterial insufficiency although I don't exactly see when that is booked. The areas on the left foot including the lateral left foot and the plantar left fifth metatarsal head look about the same. 04/25/18-He is here in follow-up evaluation for bilateral foot ulcers. MRI obtained was negative for osteomyelitis. Wound culture was negative. We will continue with same treatment plan he'll follow-up next week 05/02/18; the right plantar foot wound over the third metatarsal head actually looks better than when I last saw this. His MRI was negative for osteomyelitis wound culture was negative. On the left  plantar foot both wounds on the plantar fifth metatarsal head and on the lateral foot both are covered in a very hard circumferential callus. 05/09/18; right plantar foot wound over the third metatarsal head stable from last week. Left plantar foot wound over the fifth metatarsal head also stable but with callus around both wound areas The area on the left lateral foot had thick callus over a surface and I had some thoughts about leaving this intact however it felt boggy. We've been using silver alginate all wounds 05/16/18; since the patient was last here he was hospitalized from 5/15 through 5/19. 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 This grew MRSA. He was placed on vancomycin.MRI of the left foot suggested "progressive" osteomyelitis and bone destruction of the cuboid and the base of the fifth metatarsal. Stable erosive changes of the base of the second metatarsal. I'm wondering if they're aware that he had surgery and debridement in the area of the underlying bone previously by Dr. [name]. In any case, He was  also revascularized with an angioplasty and stenting of the left tibial peroneal trunk and angioplasty of the left posterior tibial artery. He is on vancomycin at dialysis. He has a 2 week follow-up with infectious disease. He has vascular surgery follow-up. He was started on Plavix. 05/23/18; the patient's wound on the lateral left foot at the level of the fifth metatarsal head and the plantar wound on the plantar fifth metatarsal head both look better. The area on the right third metatarsal head still has depth and undermining. We've been using silver alginate. He is on vancomycin. He has been revascularized on the left 05/30/18; he continues on vancomycin at dialysis. Revascularized on the left. The area on the left lateral foot is just about closed. Unfortunately the area over the plantar fifth metatarsal head undermining superiorly as does the area over the right third metatarsal head. Both of these significantly deteriorated from last week. He has appointments next week with infectious disease and orthopedics I delayed putting on a cast on the left until those appointments which therefore we made we'll bring him in on Friday the 14th with the idea of a cast on the left foot 06/06/18; he continues on vancomycin at dialysis. Revascularized on the left. He arrives today with a ballotable swelling just above the area on the left lateral foot where his previous wound was. He has no pain but he is reasonably insensate. The difficult areas on the plantar left fifth metatarsal head looks stable whereas the area on the right third plantar metatarsal head about the same as last week there is undermining here although I did not unroofed this today. He required a considerable debridement of the swelling on the left lateral foot area and I unroofed an abscess with a copious amount of brown purulent material which I obtained for culture. I don't believe during his recent hospitalization he had any further imaging  although I need to review this. Previous cultures from this area done in this clinic showed MRSA, I would be surprised if this is not what this is currently even though he is on vancomycin. 06/13/18; he continues on vancomycin at dialysis however he finishes this on Sunday and then graduates the doxycycline previously prescribed by Dr. [name]. The abscess site on the lateral foot that I unroofed last time grew moderate amounts of methicillin-resistant staph aureus that is  both vancomycin and tetracycline sensitive so we should be okay from that regard. He arrives today in clinic with a connection between the abscess site and the area on the lateral foot. We've been using silver alginate to all his wound areas. The right third metatarsal head wound is measuring smaller. Using silver alginate on both wound areas 06/20/18; transitioning to doxycycline prescribed by Dr. [name]. The abscess site on the lateral foot that I unroofed 2 weeks ago grew MRSA. That area has largely closed down although the lateral part of his wound on the fifth metatarsal head still probes to bone. I suspect all of this was connected. On the right third metatarsal head smaller looking wound but surrounded by nonviable tissue that once again requires debridement using silver alginate on both areas 06/27/18; on doxycycline 100 twice a day. The abscess on the lateral foot has closed down. Although he still has the wound on the left fifth metatarsal head that extends towards the lateral part of the foot. The most lateral part of this wound probes to bone Right third metatarsal head still a deep probing wound with undermining. Nevertheless I elected not to debridement this this week If everything is copious static next week on the left I'm going to attempt a total contact cast 07/04/18; he is tolerating his doxycycline. The abscess on the left lateral foot is closed down although there is still a deep wound here that probes to bone. We  will use silver collagen under the total contact cast Silver collagen to the deep wound over the right third metatarsal head is well 07/11/18; he is tolerating doxycycline. The left lateral fifth plantar metatarsal head still probes deeply but I could not probe any bone. We've been using silver collagen and this will be the second week under the total contact cast Silver collagen to deep wound over the right third metatarsal head as well 07/18/18; he is still taking doxycycline. The left lateral fifth plantar metatarsal this week probes deeply with a large amount of exposed bone. Quite a deterioration. He required extensive debridement. Specimens of bone for pathology and CNS obtained Also considerable debridement on the right third metatarsal head 07/25/18; bone for pathology last week from the lateral left foot showed osteomyelitis. CandS of the bone Enterobacter. And Klebsiella. He is now on ciprofloxacin and the doxycycline is stopped [Dr. [name] of infectious disease] area He has an appointment with Dr. [name] tomorrow I'm going to leave the total contact cast off for this week. May wish to try to reapply that next week or the week after depending on the wound bed looks. I'm not sure if there is an operative option here, previously is followed with Dr. [name] 08/01/18 on evaluation today patient presents for reevaluation. He has been seen by infectious disease, Dr. [name], and he has placed the patient back on Ceftaz currently to be given to him at dialysis three times a week. 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 Fortunately there does not appear to be the evidence of infection at this point in time. Overall the patient has been tolerating the dressing changes without complication. We are no longer due lives in the cast of left foot secondary to the infection obviously. 08/10/18 on evaluation today patient appears to be  doing rather well all things considered in regard to his left plantar foot ulcer. He does have a significant callous on the lateral portion of his  left foot he wonders if I can help clean that away to some degree today. Fortunately he is having no evidence of infection at this time which is good news. No fevers chills noted. With that being said his right plantar foot actually does have a significant callous buildup around the wound opening this seems to not be doing as well as I would like at this point. I do believe that he would benefit from sharp debridement at this site. Subsequently I think a total contact cast would be helpful for the right foot as well. 08/17/18; the patient arrived today with a total contact cast on the right foot. This actually looks quite good. He had gone to see Dr. Megan Salon of infectious disease about the osteomyelitis on the left foot. I think he is on IV Fortaz at dialysis although I am not exactly sure of the rationale for the Fortaz at the time of this dictation. He also arrived in today with a swelling on the lateral left foot this is the site of his original wounds in fact when I first saw this man when he was under Dr. Ardeen Garland care I think this was the site of where the wound was located. It was not particularly tender however using a small scalpel I opened it to [place] some moderate amount of purulent drainage. 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 Last MRI was in May that showed progressive osteomyelitis and bone destruction of the cuboid and base of the fifth metatarsal. Last week he had an abscess over this same area this was removed. Culture was negative. We are continuing with a total contact cast to the area on the third metatarsal head on the right and making some good progress here. The patient asked me again about renal  transplant. He is not on the list because of the open wounds. 08/31/18; apparently the patient had an interruption in the [place] but he is now back on this at dialysis. Apparently there was a misunderstanding and Dr. Crissie Figures orders. Due to have the MRI of the left foot tonight. The area on the right foot continues to have callus thick skin and subcutaneous tissue around the wound edge that requires constant debridement however the wound is smaller we are using a total contact cast in this area. Alginate all wounds 09/07/18; without much surprise the MRI of his left foot showed osteomyelitis in the reminiscent of his fourth metatarsal but also the third metatarsal. She arrives in clinic today with the right foot and a total contact cast. There was purulent drainage coming out of the wound which I have cultured. Marked deterioration here with undermining widely around the wound orifice. Using pickups and a scalpel I remove callus and subcutaneous tissue from a substantial new opening. In a similar fashion the area on the left lateral foot that was blistered last week I have opened this and remove skin and subcutaneous tissue from this area to expose a obvious new wound. I think there is extension and communication between all of this on the left foot. The patient is on Fortaz at dialysis. Culture done of the right foot. We are clearly not making progress here. We have made him an appointment with Dr. [name] of orthopedics. I think an amputation of the left leg may be discussed. I don't think there is anything that can be done with foot  salvage. 09/14/18; culture from the right foot last time showed a very resistant MRSA. This is resistant to doxycycline. I'm going to try to get linezolid at least for a week. He does not have a appointment with Dr. [name] yet. 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 I'll send a message to Dr. [name] of  infectious disease. I want the patient to see Dr. [name] to go over the pros and cons of an amputation which I think will be a BKA. The patient had a question about whether this is curative or not. I told him that I thought it would be although spread of staph aureus infection is not unheard of. MRI of the right foot was done at the end of April 2019 did not show osteomyelitis. The patient last saw Dr. [name] on 9/3. I'll send Dr. [name] a message. I would really like him to weigh the pros and cons of a BKA on the left otherwise he'll probably need IV antibiotics and perhaps hyperbaric oxygen again. He has not had a good response to this in the past. His MRI earlier this month showed progressive damage in the remnants of the fourth and third metatarsal 09/21/2018; sees Dr. [name] of orthopedics next week to discuss a left BKA in response to the osteomyelitis in the left foot. Using silver alginate to all wound areas. He is completing the Zyvox I did put in for him after last culture showed MRSA 09/29/2018; patient saw Dr. [name] of orthopedics discussed the osteomyelitis in the left foot third and fourth metatarsals. He did not recommend urgent surgery but certainly stated the only surgical option would be a BKA. He is communicating with Dr. [name]. The problem here is the instability of the areas on his foot which constantly generate draining abscesses. I will communicate with Dr. [name] about this. He has an appointment on 10/23 10/05/2018; patient sees Dr. [name] on 10/23. I have sent him a secure message not ordered any additional antibiotics for now. Patient continues to have 2 open areas on the left plantar foot at the fifth metatarsal head and the lateral aspect of the foot. These have not changed all that much. The area on the right third met head still has thick callus and surrounding subcutaneous tissue we have been using silver alginate 10/12/2018; patient sees Dr. [name] or colleague on  10/23. 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 The area on the right plantar foot is just about closed. The superior wound on the left fifth metatarsal head is callused over but I am not sure if this is closed. The area below it is about the same. Considerable amount of callus on the lateral foot. We have been using silver alginate to the wounds 10/19/2018; the patient saw Dr. [name] on 10/22. He wishes to try and continue to save the left foot. He has been given 6 weeks of oral ciprofloxacin. We continue to put a total contact cast on the right foot. The area over the fifth metatarsal head on the left is callused over/perhaps healed but I did not remove the callus to find out. He still has the wound on the left lateral midfoot requiring debridement. We have been using silver alginate to all wound areas 10/26/2018 On the left foot our intake nurse noted some purulent drainage from the inferior wound [currently the only one that is not  callused over] On the right foot even though he is in total contact cast a considerable amount of thick black callus and surface eschar. On this side we have been using silver alginate under a total contact cast. he remains on ciprofloxacin as prescribed by Dr. [name] 11/02/2018; culture from last week which is done of the probing area on the left midfoot wound showed MRSA "few". I am going to need to contact Dr. [name] which I will do today I think he is going to need IV antibiotics again. The area on the left foot which was callused on the side is clearly separating today all of this was removed a copious amounts of callus and necrotic subcutaneous tissue. The area on the right plantar foot actually remained healthy looking with a healthy granulated base. We are using silver alginate to all wound areas 11/09/2018; unfortunately neither 1 of the patient's wounds areas looks at all satisfactory. On  the left he has considerable necrotic debris from the plantar wound laterally over the foot. I removed copious amounts of material including callus skin and subcutaneous tissue. On the right foot he arrives with undermining and frankly purulent drainage. Specimen obtained for culture and debridement of the callused skin and subcutaneous tissue from around the circumference. Because of this I cannot put him back in a total contact cast but to be truthful we are really unfortunately not making a lot of progress I did put in a secure text message to Dr. [name] wondering about the MRSA on the left foot. He suggested vancomycin although this is not started. I was left wondering if he expected me to send this into dialysis. Has we have more purulence on the right side wait for that result before calling dialysis 11/16/2018; I called dialysis earlier this week to get the vancomycin started. I think he got the first dose on Tuesday. The patient tells me that he fell earlier this week twisting his left foot and ankle and he is swelling. He continues to not look well. He had blood cultures done at dialysis on Tuesday he is not been informed of the results. 12/07/2018; the patient was admitted to hospital from 11/22/2018 through 12/02/2018. He underwent a left BKA. Apparently had staph sepsis. In the meantime being off the right foot this is closed over. He follows up with Dr. 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 Subsequent to this he actually had multiple falls in fact he tells me to following which calls the wound to be his apparently. He has been seeing Dr. [name] and his physician assistant in the interim. They actually did want him to come back for reevaluation to see if there's anything we can do for me wound care perspective the help this area to heal more appropriately. The patient  states that he does not have a tremendous amount of pain which is good news. No fevers, chills, nausea, or vomiting noted at this time. We have gotten approval from Dr. [name] office had [place] for [place] to treat the patient for his stop wound. This is due to the fact that the patient was in the 90 day postop global. 1/21; the patient does not have an open wound on the right foot although he does have some pressure areas that will need to be padded when he is transferring. The dehisced surgical wound looks clean although there is some undermining of 1.5  cm superiorly. 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 This appears healthy but there is undermining. TheraSkin #1 2/11; TheraSkin #2 2/25; TheraSkin #3. Wound is measuring smaller 3/10; TheraSkin #4 wound is measuring smaller 3/24; TheraSkin #5 wound is measuring smaller and looks healthy. 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 He has no open area on the right foot although he does have some callused area over the original third metatarsal head wound. The third metatarsal is also subluxed on the right foot. I have warned him today that he cannot consider wearing a prosthesis for at least a month but he can go for measurements. He is going to need to keep the right foot padded is much as possible in his diabetic shoe indefinitely READMISSION 06/12/2019 Mr. [name] is a type II diabetic on dialysis. He has been in this clinic multiple times with wounds on his bilateral lower extremities. 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 He also has a history of wounds on his right foot but tells me that everything is going well here. He actually came in here with his prosthesis walking with a cane. We were all really quite gratified to see  this He states over the last several weeks he has had a painful area at the tip of the left third finger. His dialysis shunt in his is in the left upper arm and this particularly hurts during dialysis when his fingers get numb and there is a lot of pain in the tip of his left third finger. I believe he is seen vascular surgery. He had a Doppler done to evaluate for a dialysis steal syndrome. He is going for a banding procedure 1 week from today towards the left AV fistula. I think if that is unsuccessful they will be looking at creating a new shunt. He had a course of doxycycline when he which he finished about a week ago. He has been using Bactroban to the finger 6/25; the patient had his banding procedure and things seem to be going better. He is not having pain in his hand at dialysis. The area on the tip of his finger seems about closed. He did however have a paronychia I the last time I saw him. I have been advising him to use topical Bactroban washing the finger. Culture I did last time showed a few staph epidermidis which I was willing to dismiss as superficial skin contaminant. He arrives today with the finger wound looking better however the paronychia a seems worse 7/9; 2-week follow-up. He does not have an open wound on the tip of his third left finger. I thought he had an initial ischemic wound on the tip of the finger as well as a paronychia a medially. All of this appears to be closed. 8/6-Patient returns after 3 weeks for follow-up and has a right second met head plantar callus with a significant area of maceration hiding an ulcer ABI repeated today on right - 1.26 8/13-Patient returns at 1 week, the right second met head plantar wound appears to be slightly better, it is surrounded by the callus area we are using silver alginate 8/20; the patient came back to clinic 2 weeks ago with a wound on the right second metatarsal head. He apparently told  me he developed callus in this area  but also went away from his custom shoes to a pair of running shoes. Using silver alginate. 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 There might be much we can do to offload this although the patient tells me he has a wheelchair that he is using to try and stay off the wound is much as possible 8/27; right second metatarsal head. Still small punched out area with thick callus and subcutaneous tissue around the wound. We have been using silver collagen. This is always been an issue with this man's wound especially on this foot 9/3; right second met head. Still the same punched-out area with thick callus and subcutaneous tissue around the wound removing the circumference demonstrates repetitively undermining area. I have removed all the subcutaneous tissue associated with this. We have been using silver collagen 9/15; right second metatarsal head. Same punched-out thick callus and subcutaneous tissue around the wound area. Still requiring debridement we have been using silver collagen I changed back to silver alginate X-ray last time showed no evidence of osteomyelitis. Culture showed Klebsiella and he was started and Keflex apparently just 4 days ago 9/24; right second metatarsal head. He is completed the antibiotics I gave him for 10 days. Same punched-out thick callus and subcutaneous tissue around the area. Still requiring debridement. We have been using silver alginate. The area itself looks swollen and this could be just [place] that comes on this area with walking on a prosthesis on the left however I think he needs an MRI and I am going to order that today. There is not an option to offload this further 10/1; right second plantar metatarsal head. MRI booked for October 6. Generally looking better today using silver alginate 10/8; right second plantar metatarsal head. MRI that was done on 10/6 was negative for osteomyelitis noted prior amputations of the second and fourth  toes at the MTP. Prior amputation of the third toe to the base of the middle phalanx. Prior amputation of the fifth toe to the head of the metatarsal. 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 He does not recall about hearing about this. He did not have any pain but then again he has reduced sensation from diabetic neuropathy 10/15; right second plantar metatarsal head. Still in the same condition callus nonviable subcutaneous tissue which cleans up nicely with debridement but reforms by the next week. The patient tells me that he is offloading this is much as he can including taking his wheelchair to dialysis. We have been using silver alginate, changed to silver collagen today 10/22; right second plantar metatarsal head. Wound measures smaller but still thick callus around this wound. Still requiring debridement 11/5 right second plantar metatarsal head. Less callus around the wound circumference but still requiring debridement. There is still some undermining which I cleaned out the wound looks healthy but still considerable punched out depth with a relatively small circumference to the wound there is no palpable bone no purulent drainage 11/12; right second plantar metatarsal head. Small wound with thick callus tissue around the wound and significant undermining. We have been using silver collagen after my continuous debridements of this area 11/19; patient arrives in today with purulent drainage coming out of the wound in the second third met head area on the right foot. Serosanguineous thick drainage. Specimen obtained for culture. 11/21/2019 on evaluation today patient appears to be doing well  with regard to his foot ulcer. The good news is is culture came back negative for any bacteria there was no growth. The other good news is his x-ray also appears to be doing well. The foot is also measuring much better than what it was. Overall I am very  pleased with how things seem to be progressing. 12/22; patient was in [facility] for several weeks due to the death of a family member. He said he stayed off the wound using silver alginate. 01/03/2020. Patient has a small open area with roughly 0.3 cm of circumferential undermining. The orifice looks smaller. We have been using silver collagen. There is not a wound more aggressive way to offload this area as he has a prosthesis on the other leg 1/21; again the same small area is 2 weeks ago. We have been using silver collagen I change that to endoform today I really believe that this is an offloading issue 2/4; the area on the right third met head. We have been using endoform. 2/11 the area on the right third met head we have been using silver alginate. 2/25; the area on the right third met head. There is much less callus on this today. The patient states he is offloading this even more aggressively. As an example he is taking his wheelchair into dialysis on most days 3/4; right third metatarsal head. Again he has eschar over the surface of the wound which I removed with a #3 curette. Some subcutaneous debris. This has 0.8 cm of direct probing depth. I cannot feel any bone here. I did do a culture the area 3/11; right third metatarsal head. Again an eschared area over the wound which almost makes this look superficial. Again debridement reveals a wound with probing depth probably not much different from last week there is no palpable bone no palpable purulence. The patient tells me that he is making every effort to offload this area properly. He is taking wheelchair into dialysis etc. There is no option for forefoot offloading or a total contact cast. We used Oasis #1 today 3/18; again callus and eschar over the wound surface. I remove this but still a probing wound although only 3 mm in depth this time. This is an improvement 3/25; again callus and eschar over the wound surface which I  removed the wound is much deeper today at 0.7 cm. There is no palpable bone. Because of the appearance of this a culture was done. I am not going to put the Oasis back in this. Concerned about underlying infection. Notable for the fact that he is just finished doxycycline for the last go round of this 3/30; still 0.7 cm in depth which is disappointing. Culture I gave last time showed a few Staph aureus which is methicillin susceptible. This should have been taken care of by the recent course of doxycycline I gave him. We are using silver alginate 4/6; almost 1 cm in depth. We have been using silver alginate with underlying Bactroban. 4/15; about 1 cm in depth still. There is no palpable bone but there is undermining thick surface again as usual. Our intake nurse noted what looked to be purulent drainage I suppose that could have been Bactroban but I did a culture for CandS 4/20; depth down 0.7 cm. Culture from last time was negative we have been using Bactroban and silver alginate 4/29; depth down 0.5 cm. Using silver alginate. He reports some drainage 5/6; comes in with the wound worse. Wound is deeper  and tunnels. Measures 0.9 depth and an undermining area of 1.8 cm. 5/18; there is a change the dressing last week. Still using Sorbact hydrogel. What I can see of the base of the wound looks better. 6/1. Not much change. I changed him to endoform today. He has 1.1 cm in depth no palpable bone 6/8; again a small punched out wound with significant undermining. Culture I did last week was negative. T oday he had some purulent drainage perhaps mixed with some retained endoform. 6/22; MRI report below IMPRESSION: Skin ulceration on the plantar surface of the foot at the level of the head of the second metatarsal. Mild marrow edema in the head of the second metatarsal could be reactive or due to osteomyelitis. Thin fluid collection surrounding the head and neck of the second metatarsal appears to  communicate with the patient's skin wound and is worrisome for a septic collection. Advanced first MTP osteoarthritis. Prior amputations as noted above. Electronically Signed By: Inge Rise M.D. On: 06/16/2020 11:51 PCR culture I did last time I believe showed peptostreptococcus. There was no comment on any other gram-negative or gram-positive organisms. I gave him a course of Flagyl which I think he is finishing up. We are not making any progress with the wound over the right second/third metatarsal head. We are using silver alginate putting him in [employer]) Signed: 06/17/2020 5:20:04 PM By: Linton Ham MD Entered By: Linton Ham on 06/17/2020 13:46:43 -------------------------------------------------------------------------------- Physical Exam Details Patient Name: Date of Service: [name]. 06/17/2020 9:45 A M Medical Record Number: [id number] Patient Account Number: [id number] Date of Birth/Sex: Treating RN: May 29, 1951 (68 y.o. [name] Primary Care Provider: [name] Other Clinician: Referring Provider: Treating Provider/Extender: Tanna Savoy in Treatment: 53 Constitutional Sitting or standing Blood Pressure is within target range for patient.. Pulse regular and within target range for patient.[name] Respirations regular, non-labored and within target range.. Temperature is normal and within the target range for the patient.[name] Appears in no distress. Cardiovascular Needle pulses are palpable. Notes Wound exam; small punched-out area over the second metatarsal head. This does not probe to bone but it is deep. We are making absolutely no progress here. There is surrounding callus indicative of pressure on the area but I am not sure that this is the only problem we are dealing with. There is no purulent drainage Electronic Signature(s) Signed: 06/17/2020 5:20:04 PM By: Linton Ham MD Entered By:  Linton Ham on 06/17/2020 11:39:31 -------------------------------------------------------------------------------- Physician Orders Details Patient Name: Date of Service: [name]. 06/17/2020 9:45 A M Medical Record Number: [id number] Patient Account Number: [id number] Date of Birth/Sex: Treating RN: [birth year] (68 y.o. 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 Nurse Visit: - 1 week Dressing Change Frequency Wound #41 Right Metatarsal head second Do not change entire dressing for one week. Skin Barriers/Peri-Wound Care Moisturizing lotion - right leg. Wound Cleansing May shower with protection. - patient to use a cast protector. Primary Wound Dressing Wound #41 Right Metatarsal head second Calcium Alginate with Silver Secondary Dressing Wound #41 Right  Metatarsal head second Dry Gauze Other: - felt to offload periwound. Edema Control Kerlix and Coban - Right Lower Extremity Avoid standing for long periods of time Elevate legs to the level of the heart or above for 30 minutes daily and/or when sitting, a frequency of: - throughout the day. Additional Orders / Instructions Other: - minimize walking and standing on foot to aid in wound healing and callus buildup. Consults Orthotist - [facility], Dr [name] - (ICD10 [phone] - Type 2 diabetes mellitus with foot ulcer) Electronic Signature(s) Signed: 06/17/2020 5:20:04 PM By: Linton Ham MD Signed:  06/18/2020 4:51:47 PM By: Carlene Coria RN Entered By: Carlene Coria on 06/17/2020 10:55:27 Prescription 06/17/2020 -------------------------------------------------------------------------------- Adaline Sill MD Patient Name: Provider: 02-May-1951 [id number] Date of Birth: NPI#[name] [id number] Sex: DEA #: [phone] [id number] Phone #: License #: [place] Patient Address: [address] [place], [place] [address] [place], [place] [address] [phone] Allergies Name Reaction Severity penicillin welts, swelling Severe Provider's Orders Orthotist - ICD10: E11.621 - [facility], Dr [name] Hand Signature: Date(s): Electronic Signature(s) Signed: 06/17/2020 5:20:04 PM By: Linton Ham MD Signed: 06/18/2020 4:51:47 PM By: Carlene Coria RN Entered By: Carlene Coria on 06/17/2020 10:55:30 -------------------------------------------------------------------------------- Problem List Details Patient Name: Date of Service: [name]. 06/17/2020 9:45 A M Medical Record Number: [id number] Patient Account Number: [id number] Date of Birth/Sex: Treating RN: May 15, 1951 (68 y.o. 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, other extremity 06/12/2019 No Yes L03.115 Cellulitis of right lower limb 11/15/2019 No Yes Inactive Problems ICD-10 Code Description Active Date Inactive Date L03.114 Cellulitis of left upper limb 06/12/2019 06/12/2019 L98.498  Non-pressure chronic ulcer of skin of other sites with other specified severity 06/12/2019 06/12/2019 S61.209D Unspecified open wound of unspecified finger without damage to nail, subsequent 06/12/2019 06/12/2019 encounter Resolved Problems Electronic Signature(s) Signed: 06/17/2020 5:20:04 PM By: Linton Ham MD Entered By: Linton Ham on 06/17/2020 11:33:34 -------------------------------------------------------------------------------- Progress Note Details Patient Name: Date of Service: [name]. 06/17/2020 9:45 A M Medical Record Number: [id number] Patient Account Number: [id number] Date of Birth/Sex: Treating RN: 1951/01/01 (68 y.o. [name] Primary Care Provider: [name] Other Clinician: Referring Provider: Treating Provider/Extender: Tanna Savoy in Treatment: 89 Subjective History of Present Illness (HPI) The following HPI elements were documented for the patient's wound: Location: Patient presents with a wound to bilateral feet. Quality: Patient reports experiencing essentially no pain. Severity: Mildly severe wound with no evidence of infection Duration: Patient has had the wound for greater than 2 weeks prior to presenting for treatment The patient is a pleasant 69 yrs old bm here for evaluation of ulcers on the plantar aspect of both feet. 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 He has a left arm graft for access. He has been trying to stay off his feet for weeks but does not seem to have any improvement in the wound. He has been seeing someone at the foot center and was referred to the Wound Care  center for further evaluation. 12/30/15 the patient has 3 wounds one over the right first metatarsal head and 2 on the left foot at the left fifth and lleft first metatarsal head. All of these look relatively similar. The one over the left fifth probe to bone I could not prove  that any of the others did. I note his MRI in November that did not show osteomyelitis. His peripheral pulses seem robust. All of these underwent surgical debridement to remove callus nonviable skin and subcutaneous tissue 01/06/16; the patient had his sutures removed from the right fifth ray amputation. There may be a small open part of this superiorly but otherwise the incision looks good. Areas over his right first, left first and left fifth metatarsal head all underwent surgical debridement as a varying degree of callus, skin and nonviable subcutaneous tissue. The area that is most worrisome is the right fifth metatarsal head which has a wound probes precariously close to bone. There is no purulent drainage or erythema 01/13/16; I'm not exactly sure of the status of the right fifth ray amputation site however he follows with Dr. [name] later this week. The area over his right first plantar metatarsal head, left first and fifth plantar metatarsal head are all in the same status. Thick circumferential callus, nonviable subcutaneous tissue. Culture of the left fifth did not culture last week 01/20/16; all of the patient's wounds appear and roughly the same state although his amputation site on the right lateral foot looks better. His wounds over the right first, left first and left fifth metatarsal heads all underwent difficult surgical debridement removing circumferential callus nonviable skin and subcutaneous tissue. There is no overt evidence of infection in these areas. MRI at the end of December of the left foot did not show osteomyelitis, right foot showed osteomyelitis of the right fifth digit he is now status post amputation. 01/27/16 the patient's wounds over his plantar first and fifth metatarsal heads on the left all appear better having been started on a total contact cast last week. They were debridement of circumferential callus and nonviable subcutaneous tissue as was the wound over the  first metatarsal head. His surgical incision on the right also had some light surface debridement done. 03/23/2016 -- the patient was doing really well and most of his wounds had almost completely healed but now he came back today with a history of having a discharge from the area of his right foot on the plantar aspect and also between his first and second toe. He also has had some discharge from the left foot. Addendum: I spoke to the PA [name] at the dialysis center whose fax number is [phone]. We discussed the infection the patient has and she will put the patient on vancomycin and Fortaz until the final culture report is back. We will fax this as soon as available. 03/30/2016 -- left foot x-ray IMPRESSION:No definitive osteomyelitis noted. X-ray of the right foot -- IMPRESSION: 1. Soft tissue swelling. Prior amputation right fifth digit. No acute or focal bony abnormality identified. If osteomyelitis remains a clinical concern, MRI can be obtained. 2. Peripheral vascular disease. His culture reports have grown an MSSA -- and we will fax this report to his hemodialysis center. He was called in a prescription of oral doxycycline but I have told her not to fill this in as he is already on IV antibiotics. 04/06/2016 -- a few days ago, I spoke to the hemodialysis center nurse who had stopped the IV  antibiotics and he was given a prescription for doxycycline 100 mg by mouth twice a day for a week and he is on this at the present time. 05/11/2016 -- he has recently seen his PCP this week and his hemoglobin A1c was 7. He is working on his paperwork to get his orthotic shoes. 05/25/2016 -- -- x-ray of the left foot IMPRESSION:No acute bony abnormality. No radiographic changes of acute osteomyelitis. No change since prior study. X-ray of the right foot -- IMPRESSION: Postsurgical changes are seen involving the fifth toe. No evidence of acute osteomyelitis. he developed a large blister on  the medial part of his right foot and this opened out and drain fluid. 06/01/2016 -- he has his MRI to be done this afternoon. 06/15/2016 -- MRI of the left forefoot without contrast shows 2 separate regions of cutaneous and subcutaneous edema and possible ulceration and blistering along the ball of the foot. No obvious osteomyelitis identified. MRI of the right foot showed cutaneous and subcutaneous thickening plantar to the first digit sesamoid with an ulcer crater but no underlying osteomyelitis is identified. 06/22/2016 -- the right foot plantar ulcer has been draining a lot of seropurulent material for the last few days. 06/30/2016 -- spoke to the dialysis center and I believe I spoke to Letona a PA at the center who discussed with me and agreed to putting Legrand Como on vancomycin until his cultures arrive. On review of his culture report no WBCs were seen or no organisms were seen and the culture was reincubated for better growth. The final report is back and there were no predominant growth including Streptococcus or Staphylococcus. Clinically though he has a lot of drainage from both wounds a lot of undermining and there is further blebs on the left foot towards the interspace between his first and second toe. 08/10/2016 -- the culture from the right foot showed normal skin flora and there was no Staphylococcus aureus was group A streptococcus isolated. 09/21/2016 -- -- MRI of the right foot was done on 09/13/2016 - IMPRESSION: Findings most consistent with acute osteomyelitis throughout the great toe, sesamoid bones and plantar aspect of the head of the first metatarsal. Fluid in the sheath of the flexor tendon of the great toe could be sympathetic but is worrisome for septic tenosynovitis. First MTP joint effusion worrisome for septic joint. He was admitted to the hospital on 09/11/2016 and treated for a fever with vancomycin and cefepime. He was seen by Dr. Bobby Rumpf of infectious  disease who recommended 6 weeks treatment with vancomycin and ceftazidime with his hemodialysis and to continue to see as in the wound clinic. Vascular consult was pending. The patient was discharged home on 09/14/2016 and he would continue with IV antibiotics for 6 weeks. 09/28/16 wound appears reasonably healthy. Continuing with total contact cast 10/05/16 wound is smaller and looking healthy. Continue with total contact cast. He continues on IV antibiotics 10/12/2016 -- he has developed a new wound on the dorsal aspect of his left big toe and this is a superficial injury with no surrounding cellulitis. He has completed 30 days of IV antibiotics and is now ready to start his hyperbaric oxygen therapy as per his insurance company's recommendation. 10/19/2016 -- after the cast was removed on the right side he has got good resolution of his ulceration on the right plantar foot. he has a new wound on the left plantar foot in the region of his fourth metatarsal and this will need sharp debridement. 10/26/2016 --  Xray of the right foot complete: IMPRESSION: Changes consistent with osteomyelitis involving the head of the first metatarsal and base of the first proximal phalanx. The sesamoid bones are also likely involved given their positioning. 11/02/2016 -- there still awaiting insurance clearance for his hyperbaric oxygen therapy and hopefully he will begin treatment soon. 11/09/2016 -- started with hyperbaric oxygen therapy and had some barotrauma to the right ear and this was seen by ENT who was prescribed Afrin drops and would probably continue with HBO and he is scheduled for myringotomy tubes on Friday 11/16/2016 -- he had his myringotomy tubes placed on Friday and has been doing much better after that with some fluid draining out after hyperbaric treatment today. The pain was much better. 11/30/2016 -- over the last 2 days he noticed a swelling and change of color of his right second toe and this  had been draining minimal fluid. 12/07/2016 -- x-ray of the right foot -- IMPRESSION:1. Progressive ulceration at the distal aspect of the second digit with significant soft tissue swelling and osseous changes in the distal phalanx compatible with osteomyelitis. 2. Chronic osteomyelitis at the first MTP joint. 3. Ulcerations at the second and third toes as well without definite osseous changes. Osteomyelitis is not excluded. 4. Fifth digit amputation. On 12/01/2016 oo I spoke to Dr. Bobby Rumpf, the infectious disease specialist, who kindly agreed to treat this with IV antibiotics and he would call in the order to the dialysis center and this has been discussed in detail with the patient who will make the appropriate arrangements. The patient will also book an appointment as soon as possible to see Dr. [name] in the office. Of note the patient has not been on antibiotics this entire week as the dialysis center did not receive any orders from Dr. [name]. I got in touch with Dr. [name] who tells me the patient has an appointment to see him this coming Wednesday. 12/14/2016 -- the patient is on ceftazidime and vancomycin during his dialysis, and I understand this was put on by his nephrologist Dr. Raliegh Ip possibly after speaking with infectious disease Dr. [name] 12/23/16; patient was on my schedule today for a wound evaluation as he had difficulties with his schedule earlier this week. I note that he is on vancomycin and ceftazidine at dialysis. In spite of this he arrives today with a new wound on the base of the right second toe this easily probes to bone. The known wound at the tip of the second toe With this area. He also has a superficial area on the medial aspect of the third toe on the side of the DIP. This does not appear to have much depth. The area on the plantar left foot is a deep area but did not probe the bone 12/28/16 -- he has brought in some lab work and the most recent labs done  showed a hemoglobin of 12.1 hematocrit of 36.3, neutrophils of 51% WBC count of 5.6, BN of 53, albumin of 4.1 globulin of 3.7, vancomycin 13 g per mL. 01/04/2017 -- he saw Dr. [name] who has recommended a amputation of the right second toe and he is awaiting this date. He sees Dr. Bobby Rumpf of infectious disease tomorrow. The bone culture taken on 12/28/2016 had no growth in 2 days. 01/11/2017 -- was seen by Dr. Bobby Rumpf regarding the management and has recommended a eval by vascular surgeons. He recommended to continue the antibiotics during his hemodialysis. Xray of the left foot -- IMPRESSION:  No acute fracture, dislocation, or osseous erosion identified. 01/18/2017 -- he has his vascular workup later today and he is going to have his right foot second toe amputation this coming Friday by Dr. [name]. We have also put in for a another 30 treatments with hyperbaric oxygen therapy 01/25/2017 -- I have reviewed Dr. Donnetta Hutching is vascular report from last week where he reviewed him and thought his vascular function was good enough to heal his amputation site and no further tests were recommended. He also had his orthopedic related to surgery which is still pending the notes and we will review these next week. 02/01/2017 -- the operative remote of Dr. 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 02/08/2017 --he was seen by Dr. [name] on 02/03/2017 -- patient is on Buckeye. after a thorough review he had recommended to continue with antibiotics during hemodialysis. The patient was seen by Dr. [name], but I do not find any follow-up  note on the electronic medical record. he has removed the dressing over the right foot to remove the sutures and asked him to see him back in a month's time 02/22/2017 -- patient has been febrile and has been having symptoms of the upper respiratory tract infection but has not been checked for the flu and it's been over 4 days now. He says he is feeling better today. Some drainage between his left first and second toe and this needed to be looked at 03/08/2017 -- was seen by Dr. Bobby Rumpf on 03/07/2017 after review he will stop his antibiotics and see him back in a month to see if he is feeling well. 03/15/2017 -- he has completed his course of hyperbaric oxygen therapy and is doing fine with his health otherwise. 03/22/2017 -- his nutritionist at the dialysis center has recommended a protein supplement to help build his collagen and we will prescribe this for him when he has the details 05/03/2017 -- he recently noticed the area on the plantar aspect of his fifth metatarsal head which opened out and had minimal drainage. 05/10/2017 -- -- x-ray of the left foot -- IMPRESSION:1. No convincing conventional radiographic evidence of active osteomyelitis.2. Active soft tissue ulceration at the tip of the fifth digit, and at the lateral aspect of the foot adjacent the base of the fifth metatarsal. 3. Surgical changes of prior fourth toe amputation. 4. 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 5. Small vessel atherosclerotic vascular calcifications. 6. 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 ======= Readmission after 5 weeks: 06/15/2017 -- the patient returns after 5 weeks having had an MRI done on 05/17/2017 MRI of the left foot without contrast showed soft tissue ulcer overlying the base of the fifth metatarsal. 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 No drainable fluid  collection to suggest an abscess. He was in hospital between 05/16/2017 and 05/25/2017 -- and on discharge was asked to follow-up with Dr. [name] and Dr. [name]. He was treated 2 weeks post discharge with vancomycin plus ceftriaxone with hemodialysis and oral metronidazole 500 mg 3 times a day. The patient underwent a left  fifth ray amputation and excision on 5/23, but continued to have postoperative spikes of fever. last hemoglobin A1c was 6.7% He had a postoperative MR of the foot on 05/23/2017 -- which showed no new areas of cortical bone loss, edema or soft tissue ulceration to suggest osteomyelitis. the patient has completed his course of IV antibiotics during dialysis and is to see the infectious disease doctor tomorrow. Dr. [name] had seen him after suture removal and asked him to keep the wound with a dry dressing 06/21/2017 -- he was seen by Dr. Bobby Rumpf of infectious disease on 06/16/2017 -- he stopped his Flagyl and will see him back in 6 weeks. He was asked to follow-up with Dr. [name] and with the wound clinic clinic. 07/05/17; bone biopsy from last time showed acute osteomyelitis. This is from the reminiscent in the left fifth metatarsal. Culture result is apparently still pending [holding for anaerobe}. From my understanding in this case this is been a progressive necrotic wound which is deteriorated markedly over the last 3 weeks since he returned here. He now has a large area of exposed bone which was biopsied and cultured last week. Dr. Graylon Good has put him on vancomycin and Fortaz during his hemodialysis and Flagyl orally. He is to see Dr. [name] next week 07/19/2017 -- the patient was reviewed by Dr. [name] of orthopedics who reviewed the case in detail and agreed with the plan to continue with IV antibiotics, aggressive wound care and hyperbaric oxygen therapy. He would see him back in 3 weeks' time 08/09/2017 -- saw Dr. Bobby Rumpf on 08/03/2017 -he was restarted on his  antibiotics for 6 weeks which included vanco, ceftaz and Flagyl. he recommended continuing his antibiotics for 6 weeks and reevaluate his completion date. He would continue with wound care and hyperbaric oxygen therapy. 08/16/2017 -- I understand he will be completing his 6 weeks of antibiotics sometime later this week. 09/19/2017 -- he has been without his wound VAC for the last week due to lack of supplies. He has been packing his wound with silver alginate. 10/04/2017 -- he has an appointment with Dr. [name] tomorrow and hence we will not apply the wound VAC after his dressing changes today. 10/11/2017 -- he was seen by Dr. Bobby Rumpf on 10/05/2017, noted that the patient was currently off antibiotics, and after thorough review he recommended he follow-up with the wound care as per plan and no further antibiotics at this point. 11/29/17; patient is arrived for the wound on his left lateral foot.[name] He is completed IV antibiotics and 2 rounds of hyperbaric oxygen for treatment of underlying osteomyelitis. He arrives today with a surface on most of the wound area however our intake nurse noted drainage from the superior aspect. This was brought to my attention. 12/13/2017 -- the right plantar foot had a large bleb and once the bleb was opened out a large callus and subcutaneous debris was removed and he has a plantar ulcer near the fourth metatarsal head 12/28/17 on evaluation today patient appears to be doing acutely worse in regard to his left foot. The wound which has been appearing to do better as now open up more deeply there is bone palpable at the base of the wound unfortunately. He tells me that this feels "like it did when he had osteomyelitis previously" he also noted that his second toe on the left foot appears to be doing worse and is swollen there does appear to be some fluid collected underneath. His right foot  plantar ulcer appears to be doing somewhat better at this point and there  really is no complication at the site currently. No fevers, chills, nausea, or vomiting noted at this time. Patient states that he normally has no pain at this site however. T oday he is having significant pain. 01/03/18; culture done last week showed methicillin sensitive staph aureus and group B strep. He was on Septra however he arrives today with a fever of 101. He has a functional dialysis shunt in his left arm but has had no pain here. No cough. He still makes some urine no dysuria he did have abdominal pain nausea and diarrhea over the weekend but is not had any diarrhea since yesterday. Otherwise he has no specific complaints 01/05/18; the patient returns today in follow-up for his presentation from 1/8. At that point he was febrile. I gave him some Levaquin adjusted for his dialysis status. He tells me the fever broke that night and it is not likely that this was actually a wound infection. He is gone on to have an MRI of the left foot; this showed interval development of an abnormal signal from the base of the third and fourth metatarsal and in the cuboid reminiscent consistent with osteomyelitis. There is no mention of the left second toe I think which was a concern when it was ordered. The patient is taking his Levaquin 01/10/18; the patient has completed his antibiotics today. The area on the left lateral foot is smaller but it still probes easily to bone. He has underlying osteomyelitis here and sees Dr. Megan Salon of infectious disease this week ooThe area on the right third plantar metatarsal head still requires debridement not much change in dimensions [facility] has a new wound on the medial tip of his right third toe again this probes to bone. He only noticed this 2 days ago 01/17/18; the patient saw Dr. [name] last week and he is back on Vanco and Fortaz at dialysis. This is related to the osteomyelitis in the base of his left lateral foot which I think is the bases of his third and fourth  metatarsals and cuboid remnant. We are previously seeing him for a wound also on the base of the fourth med head on the right. X-ray of this area did not show osteomyelitis in relation to a new wound on the tip of his right third toe. Again this probes to bone. Finally his second toe on the left which didn't really show anything on the MRI at least no report appears to have a separated cutaneous area which I think is going to come right off the tip of his toe and leave exposed bone. Whether this is infectious or ischemic I am not clear Culture I did from the third toe last week which was his new wound was negative. Plain x-ray on the right foot did not show osteomyelitis in the toes 01/24/18; the patient is going went to see Dr. [name]. He is going to have a amputation of the left second and right third toe although paradoxically the left second toe looks better than last week. We have treating a deep probing wound on the left lateral foot and the area over the third metatarsal head on the right foot. 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 Also a debridement including bone of the left lateral foot and a closure which is still sutured. This is a bit surprising. Also apparent debridement of the base of the right  third toe wound. 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 I'm not sure exactly where we are in the frame of things here. The wound on the left lateral foot is still sutured also the amputation sites of the left second and right third toe. He is been using Xeroform to the sutured areas silver alginate to the right foot 02/14/18; he continues on Rocephin at dialysis for osteomyelitis. I still don't have a good sense of where we are in the treatment duration. The  wounds on the left lateral foot had the sutures removed and this clearly still probes deeply. We debrided the area with a #5 curet. We'll use silver alginate to the wound oothe area over the right third met head also required debridement of callus skin and subcutaneous tissue and necrotic debris over the wound surface. This tunnels superiorly but I did not unroofed this today. ooThe areas on the tip of his toes have a crusted surface eschar I did not debridement this either. 02/21/18; he continues on Rocephin at dialysis for osteomyelitis. I don't have a good sense of where we are and the treatment duration. The wounds on the left lateral foot is callused over on presentation but requires debridement. The area on the right third med head plantar aspect actually is measuring smaller 02/28/18;patient is on Vanco and Fortaz at dialysis, I'm not sure why I thought this was Rocephin above unless it is been changed. I don't have a good sense of time frame here. Using silver collagen to the area on the left lateral foot and right third med head plantar foot 03/07/18; patient is completing bank and Fortaz at dialysis soon. He is using Silver collagen to the area on the left lateral foot and the right third metatarsal head. He has a smaller superficial wound distal to the left lateral foot wound. 03/14/18-he is here in follow-up evaluation for multiple ulcerations to his bilateral feet. He presents with a new ulceration to the plantar aspect of the left foot underneath the blister, this was deroofed to reveal a partial thickness ulcer. He is voicing no complaints or concerns, tolerated dialysis yesterday. We will continue with same treatment plan and he will follow-up next week 03/21/18; the patient has a area on the lateral left foot which still has a small probing area. The overall surface area of the wound is better. He presented with a new ulceration on the left foot plantar fifth metatarsal head last week.  The area on the right third metatarsal head appears smaller.using silver alginate to all wound areas. The patient is changing the dressing himself. He is using a Darco forefoot offloading are on the right and a healing sandal on the left 03/28/18; original wound left lateral foot. Still nowhere close to looking like a heeling surface oosecondary wound on the left lateral foot at the base of his question fifth metatarsal which was new last week ooRight third metatarsal head. ooWe have been using silver alginate all wounds 04/04/18; the original wound on the left lateral foot again heavy callus surrounding thick nonviable subcutaneous tissue all requiring debridement. The new wound from 2 weeks ago just near this at the base of the fifth metatarsal head still looks about the same ooUnfortunately there is been deterioration in the third medical head wound  on the right which now probes to bone. I must say this was a superficial wound at one point in time and I really don't have a good frame of reference year. I'll have to go back to look through records about what we know about the right foot. 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 He is also been to see Dr. [name]. Somewhere in here as somebody is ordered a VASCULAR evaluation which was done on 03/22/18. On the right is anterior tibial artery was monophasic triphasic at the posterior tibial artery. On the left anterior tibial artery monophasic posterior tibial artery monophasic. ABI in the right was 1.43 on the left 1.21. TBIs on the right at 0.41 and on the left at 0.32. A vascular consult was recommended and I think has been arranged. 04/11/18; Mr. Lardizabal has not had an MRI of the right foot since 2017. Recent x-ray of the right foot done in January and February was negative. However he has had a major deterioration in the wound over the third met head.  He is completed antibiotics last week at dialysis [place]. I think he is going to see vascular in follow-up. The areas on the left lateral foot and left plantar fifth metatarsal head both look satisfactory. I debrided both of these areas although the tissue here looks good. The area on the left lateral foot once probe to bone it certainly does not do that now 04/18/18; MRI of the right foot is on Thursday. He has what looks to be serosanguineous purulent drainage coming out of the wound over the right third metatarsal head today. He completed antibiotics bank and Fortaz 2 weeks ago at dialysis. He has a vascular follow-up with regards to his arterial insufficiency although I don't exactly see when that is booked. The areas on the left foot including the lateral left foot and the plantar left fifth metatarsal head look about the same. 04/25/18-He is here in follow-up evaluation for bilateral foot ulcers. MRI obtained was negative for osteomyelitis. Wound culture was negative. We will continue with same treatment plan he'll follow-up next week 05/02/18; the right plantar foot wound over the third metatarsal head actually looks better than when I last saw this. His MRI was negative for osteomyelitis wound culture was negative. ooOn the left plantar foot both wounds on the plantar fifth metatarsal head and on the lateral foot both are covered in a very hard circumferential callus. 05/09/18; right plantar foot wound over the third metatarsal head stable from last week. ooLeft plantar foot wound over the fifth metatarsal head also stable but with callus around both wound areas ooThe area on the left lateral foot had thick callus over a surface and I had some thoughts about leaving this intact however it felt boggy. ooWe've been using silver alginate all wounds 05/16/18; since the patient was last here he was hospitalized from 5/15 through 5/19. 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 This grew MRSA. He was placed on vancomycin.MRI of the left foot suggested "progressive" osteomyelitis and bone destruction of the cuboid and the base of the fifth metatarsal. Stable erosive changes of the base of the second metatarsal. I'm wondering if they're aware that he had surgery and debridement in the area of the underlying bone previously by Dr.  Graves. In any case, He was also revascularized with an angioplasty and stenting of the left tibial peroneal trunk and angioplasty of the left posterior tibial artery. He is on vancomycin at dialysis. He has a 2 week follow-up with infectious disease. He has vascular surgery follow-up. He was started on Plavix. 05/23/18; the patient's wound on the lateral left foot at the level of the fifth metatarsal head and the plantar wound on the plantar fifth metatarsal head both look better. The area on the right third metatarsal head still has depth and undermining. We've been using silver alginate. He is on vancomycin. He has been revascularized on the left 05/30/18; he continues on vancomycin at dialysis. Revascularized on the left. The area on the left lateral foot is just about closed. Unfortunately the area over the plantar fifth metatarsal head undermining superiorly as does the area over the right third metatarsal head. Both of these significantly deteriorated from last week. He has appointments next week with infectious disease and orthopedics I delayed putting on a cast on the left until those appointments which therefore we made we'll bring him in on Friday the 14th with the idea of a cast on the left foot 06/06/18; he continues on vancomycin at dialysis. Revascularized on the left. He arrives today with a ballotable swelling just above the area on the left lateral foot where his previous wound was. He has no pain but he is reasonably insensate. The difficult  areas on the plantar left fifth metatarsal head looks stable whereas the area on the right third plantar metatarsal head about the same as last week there is undermining here although I did not unroofed this today. He required a considerable debridement of the swelling on the left lateral foot area and I unroofed an abscess with a copious amount of brown purulent material which I obtained for culture. I don't believe during his recent hospitalization he had any further imaging although I need to review this. Previous cultures from this area done in this clinic showed MRSA, I would be surprised if this is not what this is currently even though he is on vancomycin. 06/13/18; he continues on vancomycin at dialysis however he finishes this on Sunday and then graduates the doxycycline previously prescribed by Dr. [name]. The abscess site on the lateral foot that I unroofed last time grew moderate amounts of methicillin-resistant staph aureus that is both vancomycin and tetracycline sensitive so we should be okay from that regard. He arrives today in clinic with a connection between the abscess site and the area on the lateral foot. We've been using silver alginate to all his wound areas. The right third metatarsal head wound is measuring smaller. Using silver alginate on both wound areas 06/20/18; transitioning to doxycycline prescribed by Dr. [name]. The abscess site on the lateral foot that I unroofed 2 weeks ago grew MRSA. That area has largely closed down although the lateral part of his wound on the fifth metatarsal head still probes to bone. I suspect all of this was connected. ooOn the right third metatarsal head smaller looking wound but surrounded by nonviable tissue that once again requires debridement using silver alginate on both areas 06/27/18; on doxycycline 100 twice a day. The abscess on the lateral foot has closed down. Although he still has the wound on the left fifth metatarsal head  that extends towards the lateral part of the foot. The most lateral part of this wound probes to bone ooRight third metatarsal head still a deep  probing wound with undermining. Nevertheless I elected not to debridement this this week ooIf everything is copious static next week on the left I'm going to attempt a total contact cast 07/04/18; he is tolerating his doxycycline. The abscess on the left lateral foot is closed down although there is still a deep wound here that probes to bone. We will use silver collagen under the total contact cast Silver collagen to the deep wound over the right third metatarsal head is well 07/11/18; he is tolerating doxycycline. The left lateral fifth plantar metatarsal head still probes deeply but I could not probe any bone. We've been using silver collagen and this will be the second week under the total contact cast ooSilver collagen to deep wound over the right third metatarsal head as well 07/18/18; he is still taking doxycycline. The left lateral fifth plantar metatarsal this week probes deeply with a large amount of exposed bone. Quite a deterioration. He required extensive debridement. Specimens of bone for pathology and CNS obtained ooAlso considerable debridement on the right third metatarsal head 07/25/18; bone for pathology last week from the lateral left foot showed osteomyelitis. CandS of the bone Enterobacter. And Klebsiella. He is now on ciprofloxacin and the doxycycline is stopped [Dr. [name] of infectious disease] area He has an appointment with Dr. [name] tomorrow I'm going to leave the total contact cast off for this week. May wish to try to reapply that next week or the week after depending on the wound bed looks. I'm not sure if there is an operative option here, previously is followed with Dr. [name] 08/01/18 on evaluation today patient presents for reevaluation. He has been seen by infectious disease, Dr. [name], and he has placed the patient  back on Ceftaz currently to be given to him at dialysis three times a week. 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 Fortunately there does not appear to be the evidence of infection at this point in time. Overall the patient has been tolerating the dressing changes without complication. We are no longer due lives in the cast of left foot secondary to the infection obviously. 08/10/18 on evaluation today patient appears to be doing rather well all things considered in regard to his left plantar foot ulcer. He does have a significant callous on the lateral portion of his left foot he wonders if I can help clean that away to some degree today. Fortunately he is having no evidence of infection at this time which is good news. No fevers chills noted. With that being said his right plantar foot actually does have a significant callous buildup around the wound opening this seems to not be doing as well as I would like at this point. I do believe that he would benefit from sharp debridement at this site. Subsequently I think a total contact cast would be helpful for the right foot as well. 08/17/18; the patient arrived today with a total contact cast on the right foot. This actually looks quite good. He had gone to see Dr. Megan Salon of infectious disease about the osteomyelitis on the left foot. I think he is on IV Fortaz at dialysis although I am not exactly sure of the rationale for the Fortaz at the time of this dictation. He also arrived in today with a swelling on the lateral left foot this is the site of his original wounds in fact when I first saw this man when  he was under Dr. Ardeen Garland care I think this was the site of where the wound was located. It was not particularly tender however using a small scalpel I opened it to [place] some moderate amount of purulent drainage. We've been using silver alginate to all wound areas and a total  contact cast on the right foot 08/24/18; patient continues on Fortaz at dialysis for osteomyelitis left foot. Last MRI was in May that showed progressive osteomyelitis and bone destruction of the cuboid and base of the fifth metatarsal. Last week he had an abscess over this same area this was removed. Culture was negative. ooWe are continuing with a total contact cast to the area on the third metatarsal head on the right and making some good progress here. The patient asked me again about renal transplant. He is not on the list because of the open wounds. 08/31/18; apparently the patient had an interruption in the [place] but he is now back on this at dialysis. Apparently there was a misunderstanding and Dr. Crissie Figures orders. Due to have the MRI of the left foot tonight. ooThe area on the right foot continues to have callus thick skin and subcutaneous tissue around the wound edge that requires constant debridement however the wound is smaller we are using a total contact cast in this area. Alginate all wounds 09/07/18; without much surprise the MRI of his left foot showed osteomyelitis in the reminiscent of his fourth metatarsal but also the third metatarsal. She arrives in clinic today with the right foot and a total contact cast. There was purulent drainage coming out of the wound which I have cultured. Marked deterioration here with undermining widely around the wound orifice. Using pickups and a scalpel I remove callus and subcutaneous tissue from a substantial new opening. ooIn a similar fashion the area on the left lateral foot that was blistered last week I have opened this and remove skin and subcutaneous tissue from this area to expose a obvious new wound. I think there is extension and communication between all of this on the left foot. ooThe patient is on Fortaz at dialysis. Culture done of the right foot. We are clearly not making progress here. ooWe have made him an appointment with Dr.  [name] of orthopedics. I think an amputation of the left leg may be discussed. I don't think there is anything that can be done with foot salvage. 09/14/18; culture from the right foot last time showed a very resistant MRSA. This is resistant to doxycycline. I'm going to try to get linezolid at least for a week. He does not have a appointment with Dr. [name] yet. 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 I'll send a message to Dr. [name] of infectious disease. I want the patient to see Dr. [name] to go over the pros and cons of an amputation which I think will be a BKA. The patient had a question about whether this is curative or not. I told him that I thought it would be although spread of staph aureus infection is not unheard of. MRI of the right foot was done at the end of April 2019 did not show osteomyelitis. The patient last saw Dr. [name] on 9/3. I'll send Dr. [name] a message. I would really like him to weigh the pros and cons of a BKA on the left otherwise he'll probably need IV antibiotics and perhaps hyperbaric oxygen again. He has not had  a good response to this in the past. His MRI earlier this month showed progressive damage in the remnants of the fourth and third metatarsal 09/21/2018; sees Dr. [name] of orthopedics next week to discuss a left BKA in response to the osteomyelitis in the left foot. Using silver alginate to all wound areas. He is completing the Zyvox I did put in for him after last culture showed MRSA 09/29/2018; patient saw Dr. [name] of orthopedics discussed the osteomyelitis in the left foot third and fourth metatarsals. He did not recommend urgent surgery but certainly stated the only surgical option would be a BKA. He is communicating with Dr. [name]. The problem here is the instability of the areas on his foot which constantly generate draining abscesses. I will communicate with Dr. [name] about this. He  has an appointment on 10/23 10/05/2018; patient sees Dr. [name] on 10/23. I have sent him a secure message not ordered any additional antibiotics for now. Patient continues to have 2 open areas on the left plantar foot at the fifth metatarsal head and the lateral aspect of the foot. These have not changed all that much. The area on the right third met head still has thick callus and surrounding subcutaneous tissue we have been using silver alginate 10/12/2018; patient sees Dr. [name] or colleague on 10/23. 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 The area on the right plantar foot is just about closed. The superior wound on the left fifth metatarsal head is callused over but I am not sure if this is closed. The area below it is about the same. Considerable amount of callus on the lateral foot. We have been using silver alginate to the wounds 10/19/2018; the patient saw Dr. [name] on 10/22. He wishes to try and continue to save the left foot. He has been given 6 weeks of oral ciprofloxacin. We continue to put a total contact cast on the right foot. The area over the fifth metatarsal head on the left is callused over/perhaps healed but I did not remove the callus to find out. He still has the wound on the left lateral midfoot requiring debridement. We have been using silver alginate to all wound areas 10/26/2018 ooOn the left foot our intake nurse noted some purulent drainage from the inferior wound [currently the only one that is not callused over] ooOn the right foot even though he is in total contact cast a considerable amount of thick black callus and surface eschar. On this side we have been using silver alginate under a total contact cast. oohe remains on ciprofloxacin as prescribed by Dr. [name] 11/02/2018; culture from last week which is done of the probing area on the left midfoot wound showed MRSA "few". I am going to need  to contact Dr. [name] which I will do today I think he is going to need IV antibiotics again. The area on the left foot which was callused on the side is clearly separating today all of this was removed a copious amounts of callus and necrotic subcutaneous tissue. ooThe area on the right plantar foot actually remained healthy looking with a healthy granulated base. ooWe are using silver alginate to all wound areas 11/09/2018; unfortunately neither 1 of the patient's wounds areas looks at all satisfactory. On the left he has considerable necrotic debris from the plantar wound laterally over the foot. I removed copious amounts of material including callus skin and subcutaneous tissue. On the  right foot he arrives with undermining and frankly purulent drainage. Specimen obtained for culture and debridement of the callused skin and subcutaneous tissue from around the circumference. Because of this I cannot put him back in a total contact cast but to be truthful we are really unfortunately not making a lot of progress I did put in a secure text message to Dr. [name] wondering about the MRSA on the left foot. He suggested vancomycin although this is not started. I was left wondering if he expected me to send this into dialysis. Has we have more purulence on the right side wait for that result before calling dialysis 11/16/2018; I called dialysis earlier this week to get the vancomycin started. I think he got the first dose on Tuesday. The patient tells me that he fell earlier this week twisting his left foot and ankle and he is swelling. He continues to not look well. He had blood cultures done at dialysis on Tuesday he is not been informed of the results. 12/07/2018; the patient was admitted to hospital from 11/22/2018 through 12/02/2018. He underwent a left BKA. Apparently had staph sepsis. In the meantime being off the right foot this is closed over. He follows up with Dr. [name] this  afternoon Readmission: 01/10/19 upon evaluation today patient appears for follow-up in our clinic status post having had a left BKA on 11/20/18. Subsequent to this he actually had multiple falls in fact he tells me to following which calls the wound to be his apparently. He has been seeing Dr. [name] and his physician assistant in the interim. They actually did want him to come back for reevaluation to see if there's anything we can do for me wound care perspective the help this area to heal more appropriately. The patient states that he does not have a tremendous amount of pain which is good news. No fevers, chills, nausea, or vomiting noted at this time. We have gotten approval from Dr. [name] office had [place] for [place] to treat the patient for his stop wound. This is due to the fact that the patient was in the 90 day postop global. 1/21; the patient does not have an open wound on the right foot although he does have some pressure areas that will need to be padded when he is transferring. The dehisced surgical wound looks clean although there is some undermining of 1.5 cm superiorly. 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 This appears healthy but there is undermining. TheraSkin #1 2/11; TheraSkin #2 2/25; TheraSkin #3. Wound is measuring smaller 3/10; TheraSkin #4 wound is measuring smaller 3/24; TheraSkin #5 wound is measuring smaller and looks healthy. 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 He has no open area on the right foot although he does have some callused area over the original third metatarsal head wound. The third metatarsal is also subluxed on the right foot. I have warned him today that he cannot consider wearing a prosthesis for at least a month but he can go for measurements. He is going to need to keep the right foot padded is much as possible in his diabetic  shoe indefinitely READMISSION 06/12/2019 Mr. Sonnier is a type II diabetic on dialysis. He has been in this clinic multiple times with wounds on his bilateral lower extremities. Most recently here at the beginning of this year for 3 months with a wound  on his left BKA amputation site which we managed to get to close over. He also has a history of wounds on his right foot but tells me that everything is going well here. He actually came in here with his prosthesis walking with a cane. We were all really quite gratified to see this He states over the last several weeks he has had a painful area at the tip of the left third finger. His dialysis shunt in his is in the left upper arm and this particularly hurts during dialysis when his fingers get numb and there is a lot of pain in the tip of his left third finger. I believe he is seen vascular surgery. He had a Doppler done to evaluate for a dialysis steal syndrome. He is going for a banding procedure 1 week from today towards the left AV fistula. I think if that is unsuccessful they will be looking at creating a new shunt. He had a course of doxycycline when he which he finished about a week ago. He has been using Bactroban to the finger 6/25; the patient had his banding procedure and things seem to be going better. He is not having pain in his hand at dialysis. The area on the tip of his finger seems about closed. He did however have a paronychia I the last time I saw him. I have been advising him to use topical Bactroban washing the finger. Culture I did last time showed a few staph epidermidis which I was willing to dismiss as superficial skin contaminant. He arrives today with the finger wound looking better however the paronychia a seems worse 7/9; 2-week follow-up. He does not have an open wound on the tip of his third left finger. I thought he had an initial ischemic wound on the tip of the finger as well as a paronychia a medially. All of this  appears to be closed. 8/6-Patient returns after 3 weeks for follow-up and has a right second met head plantar callus with a significant area of maceration hiding an ulcer ABI repeated today on right - 1.26 8/13-Patient returns at 1 week, the right second met head plantar wound appears to be slightly better, it is surrounded by the callus area we are using silver alginate 8/20; the patient came back to clinic 2 weeks ago with a wound on the right second metatarsal head. He apparently told me he developed callus in this area but also went away from his custom shoes to a pair of running shoes. Using silver alginate. 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 There might be much we can do to offload this although the patient tells me he has a wheelchair that he is using to try and stay off the wound is much as possible 8/27; right second metatarsal head. Still small punched out area with thick callus and subcutaneous tissue around the wound. We have been using silver collagen. This is always been an issue with this man's wound especially on this foot 9/3; right second met head. Still the same punched-out area with thick callus and subcutaneous tissue around the wound removing the circumference demonstrates repetitively undermining area. I have removed all the subcutaneous tissue associated with this. We have been using silver collagen 9/15; right second metatarsal head. Same punched-out thick callus and subcutaneous tissue around the wound area. Still requiring debridement we have been using silver collagen I changed back to silver alginate X-ray last time showed no evidence  of osteomyelitis. Culture showed Klebsiella and he was started and Keflex apparently just 4 days ago 9/24; right second metatarsal head. He is completed the antibiotics I gave him for 10 days. Same punched-out thick callus and subcutaneous tissue around the area. Still requiring debridement. We have been using  silver alginate. The area itself looks swollen and this could be just [place] that comes on this area with walking on a prosthesis on the left however I think he needs an MRI and I am going to order that today. There is not an option to offload this further 10/1; right second plantar metatarsal head. MRI booked for October 6. Generally looking better today using silver alginate 10/8; right second plantar metatarsal head. MRI that was done on 10/6 was negative for osteomyelitis noted prior amputations of the second and fourth toes at the MTP. Prior amputation of the third toe to the base of the middle phalanx. Prior amputation of the fifth toe to the head of the metatarsal. 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 He does not recall about hearing about this. He did not have any pain but then again he has reduced sensation from diabetic neuropathy 10/15; right second plantar metatarsal head. Still in the same condition callus nonviable subcutaneous tissue which cleans up nicely with debridement but reforms by the next week. The patient tells me that he is offloading this is much as he can including taking his wheelchair to dialysis. We have been using silver alginate, changed to silver collagen today 10/22; right second plantar metatarsal head. Wound measures smaller but still thick callus around this wound. Still requiring debridement 11/5 right second plantar metatarsal head. Less callus around the wound circumference but still requiring debridement. There is still some undermining which I cleaned out the wound looks healthy but still considerable punched out depth with a relatively small circumference to the wound there is no palpable bone no purulent drainage 11/12; right second plantar metatarsal head. Small wound with thick callus tissue around the wound and significant undermining. We have been using silver collagen after my continuous  debridements of this area 11/19; patient arrives in today with purulent drainage coming out of the wound in the second third met head area on the right foot. Serosanguineous thick drainage. Specimen obtained for culture. 11/21/2019 on evaluation today patient appears to be doing well with regard to his foot ulcer. The good news is is culture came back negative for any bacteria there was no growth. The other good news is his x-ray also appears to be doing well. The foot is also measuring much better than what it was. Overall I am very pleased with how things seem to be progressing. 12/22; patient was in [facility] for several weeks due to the death of a family member. He said he stayed off the wound using silver alginate. 01/03/2020. Patient has a small open area with roughly 0.3 cm of circumferential undermining. The orifice looks smaller. We have been using silver collagen. There is not a wound more aggressive way to offload this area as he has a prosthesis on the other leg 1/21; again the same small area is 2 weeks ago. We have been using silver collagen I change that to endoform today I really believe that this is an offloading issue 2/4; the area on the right third met head. We have been using endoform. 2/11 the area on the right third met head we have been using silver  alginate. 2/25; the area on the right third met head. There is much less callus on this today. The patient states he is offloading this even more aggressively. As an example he is taking his wheelchair into dialysis on most days 3/4; right third metatarsal head. Again he has eschar over the surface of the wound which I removed with a #3 curette. Some subcutaneous debris. This has 0.8 cm of direct probing depth. I cannot feel any bone here. I did do a culture the area 3/11; right third metatarsal head. Again an eschared area over the wound which almost makes this look superficial. Again debridement reveals a wound  with probing depth probably not much different from last week there is no palpable bone no palpable purulence. The patient tells me that he is making every effort to offload this area properly. He is taking wheelchair into dialysis etc. There is no option for forefoot offloading or a total contact cast. We used Oasis #1 today 3/18; again callus and eschar over the wound surface. I remove this but still a probing wound although only 3 mm in depth this time. This is an improvement 3/25; again callus and eschar over the wound surface which I removed the wound is much deeper today at 0.7 cm. There is no palpable bone. Because of the appearance of this a culture was done. I am not going to put the Oasis back in this. Concerned about underlying infection. Notable for the fact that he is just finished doxycycline for the last go round of this 3/30; still 0.7 cm in depth which is disappointing. Culture I gave last time showed a few Staph aureus which is methicillin susceptible. This should have been taken care of by the recent course of doxycycline I gave him. We are using silver alginate 4/6; almost 1 cm in depth. We have been using silver alginate with underlying Bactroban. 4/15; about 1 cm in depth still. There is no palpable bone but there is undermining thick surface again as usual. Our intake nurse noted what looked to be purulent drainage I suppose that could have been Bactroban but I did a culture for CandS 4/20; depth down 0.7 cm. Culture from last time was negative we have been using Bactroban and silver alginate 4/29; depth down 0.5 cm. Using silver alginate. He reports some drainage 5/6; comes in with the wound worse. Wound is deeper and tunnels. Measures 0.9 depth and an undermining area of 1.8 cm. 5/18; there is a change the dressing last week. Still using Sorbact hydrogel. What I can see of the base of the wound looks better. 6/1. Not much change. I changed him to endoform today. He has 1.1  cm in depth no palpable bone 6/8; again a small punched out wound with significant undermining. Culture I did last week was negative. T oday he had some purulent drainage perhaps mixed with some retained endoform. 6/22; MRI report below ooIMPRESSION: Skin ulceration on the plantar surface of the foot at the level of the head of the second metatarsal. Mild marrow edema in the head of the second metatarsal could be reactive or due to osteomyelitis. Thin fluid collection surrounding the head and neck of the second metatarsal appears to communicate with the patient's skin wound and is worrisome for a septic collection. Advanced first MTP osteoarthritis. Prior amputations as noted above. Electronically Signed By: Inge Rise M.D. On: 06/16/2020 11:51 PCR culture I did last time I believe showed peptostreptococcus. There was no comment on any other  gram-negative or gram-positive organisms. I gave him a course of Flagyl which I think he is finishing up. We are not making any progress with the wound over the right second/third metatarsal head. We are using silver alginate putting him in kerlix [place] Objective Constitutional Sitting or standing Blood Pressure is within target range for patient.. Pulse regular and within target range for patient.[name] Respirations regular, non-labored and within target range.. Temperature is normal and within the target range for the patient.[name] Appears in no distress. Vitals Time Taken: 9:59 AM, Height: 73 in, Weight: 202 lbs, BMI: 26.6, Temperature: 98.6 F, Pulse: 89 bpm, Respiratory Rate: 16 breaths/min, Blood Pressure: 114/68 mmHg. Cardiovascular Needle pulses are palpable. General Notes: Wound exam; small punched-out area over the second metatarsal head. This does not probe to bone but it is deep. We are making absolutely no progress here. There is surrounding callus indicative of pressure on the area but I am not sure that this is the only problem we are  dealing with. There is no purulent drainage Integumentary (Hair, Skin) Wound #41 status is Open. Original cause of wound was Gradually Appeared. The wound is located on the Right Metatarsal head second. The wound measures 0.5cm length x 0.5cm width x 1.1cm depth; 0.196cm^2 area and 0.216cm^3 volume. There is Fat Layer (Subcutaneous Tissue) Exposed exposed. Tunneling has been noted at 6:00 with a maximum distance of 3.5cm. Undermining begins at 12:00 and ends at 12:00 with a maximum distance of 1.4cm. There is a medium amount of serous drainage noted. The wound margin is well defined and not attached to the wound base. There is large (67-100%) pale granulation within the wound bed. There is no necrotic tissue within the wound bed. 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 There was a Double Layer Compression Therapy Procedure by Carlene Coria, RN. Post procedure Diagnosis Wound #41: Same as Pre-Procedure Plan Follow-up Appointments: Return Appointment in 2 weeks. Nurse Visit: - 1 week Dressing Change Frequency: Wound #41 Right Metatarsal head second: Do not change entire dressing for one week. Skin Barriers/Peri-Wound Care: Moisturizing lotion - right leg. Wound Cleansing: May shower with protection. - patient to use a cast protector. Primary Wound Dressing: Wound #41 Right Metatarsal head second: Calcium Alginate with Silver Secondary Dressing: Wound #41 Right Metatarsal head second: Dry Gauze Other: - felt to offload periwound. Edema Control: Kerlix and Coban - Right Lower Extremity Avoid standing for long periods  of time Elevate legs to the level of the heart or above for 30 minutes daily and/or when sitting, a frequency of: - throughout the day. Additional Orders / Instructions: Other: - minimize walking and standing on foot to aid in wound healing and callus buildup. Consults ordered were: Orthotist - [facility], Dr [name] 1. Given the tone of the MRI I am going to ask Dr. [name] of orthopedics to see him. 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 What I am thinking currently is does this area need to be opened, debrided inspected cultured biopsied perhaps with an attempt at primary closure perhaps not. If there is specimens of bone that need to be obtained before we send  him to infectious disease I think that would be helpful 2. I continue to think that he is not offloading this area adequately however clearly that this is not the only issue here. 3. Unfortunately I cannot find the PCR culture we did 2 weeks ago but by my memory it showed peptosteptococcus and I treated him with 10 days of Flagyl which should be sufficient Electronic Signature(s) Signed: 06/17/2020 5:20:04 PM By: Linton Ham MD Entered By: Linton Ham on 06/17/2020 13:48:05 -------------------------------------------------------------------------------- SuperBill Details Patient Name: Date of Service: [name] 06/17/2020 Medical Record Number: [id number] Patient Account Number: [id number] Date of Birth/Sex: Treating RN: 06-Aug-1951 (68 y.o. 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, other  extremity L03.115 Cellulitis of right lower limb Facility Procedures The patient participates with Medicare or their insurance follows the Medicare Facility Guidelines: CPT4 Code Description Modifier Quantity [id number] (Facility Use Only) [phone] - APPLY [place] RT LEG 1 Physician Procedures : CPT4 Code Description Modifier [id number] - WC PHYS LEVEL 3 - EST PT ICD-10 Diagnosis Description E11.621 Type 2 diabetes mellitus with foot ulcer L97.512 Non-pressure chronic ulcer of other part of right foot with fat layer exposed Quantity: 1 Electronic Signature(s) Signed: 06/17/2020 5:20:04 PM By: Linton Ham MD Entered By: Linton Ham on 06/17/2020 11:42:37

## 2020-06-24 ENCOUNTER — Encounter (HOSPITAL_BASED_OUTPATIENT_CLINIC_OR_DEPARTMENT_OTHER): Payer: Medicare Other | Admitting: Internal Medicine

## 2020-06-24 DIAGNOSIS — E11621 Type 2 diabetes mellitus with foot ulcer: Secondary | ICD-10-CM | POA: Diagnosis not present
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
## 2020-06-25 NOTE — Progress Notes (Signed)
[name], [name] ([id number]) Visit Report for 06/24/2020 Debridement Details Patient Name: Date of Service: [name], [name] 06/24/2020 10:30 A M Medical Record Number: [id number] Patient Account Number: [id number] Date of Birth/Sex: Treating RN: Mar 12, 1951 (69 y.o. [name]) Carlene Coria Primary Care Provider: [name] Other Clinician: Referring Provider: Treating Provider/Extender: Tanna Savoy in Treatment: 54 Debridement Performed for Assessment: Wound #41 Right Metatarsal head second Performed By: Physician Ricard Dillon., MD Debridement Type: Debridement Severity of Tissue Pre Debridement: Fat layer exposed Level of Consciousness (Pre-procedure): Awake and Alert Pre-procedure Verification/Time Out Yes - 12:26 Taken: Start Time: 12:26 Pain Control: Lidocaine 5% topical ointment T Area Debrided (L x W): otal 1 (cm) x 1 (cm) = 1 (cm) Tissue and other material debrided: Viable, Non-Viable, Callus, Subcutaneous, Skin: Dermis , Skin: Epidermis Level: Skin/Subcutaneous Tissue Debridement Description: Excisional Instrument: Blade, Forceps Bleeding: Minimum Hemostasis Achieved: Pressure End Time: 12:29 Procedural Pain: 0 Post Procedural Pain: 0 Response to Treatment: Procedure was tolerated well Level of Consciousness (Post- Awake and Alert procedure): Post Debridement Measurements of Total Wound Length: (cm) 0.5 Width: (cm) 0.4 Depth: (cm) 0.6 Volume: (cm) 0.094 Character of Wound/Ulcer Post Debridement: Improved Severity of Tissue Post Debridement: Fat layer exposed Post Procedure Diagnosis Same as Pre-procedure Electronic Signature(s) Signed: 06/24/2020 5:22:48 PM By: Carlene Coria RN Signed: 06/25/2020 3:41:00 PM By: Linton Ham MD Entered By: Linton Ham on 06/24/2020 12:46:02 -------------------------------------------------------------------------------- HPI Details Patient Name: Date of Service: [name] Shines B. 06/24/2020  10:30 A M Medical Record Number: [id number] Patient Account Number: [id number] Date of Birth/Sex: Treating RN: 1951-04-24 (69 y.o. [name] Primary Care Provider: [name] Other Clinician: Referring Provider: Treating Provider/Extender: Tanna Savoy in Treatment: 54 History of Present Illness Location: Patient presents with a wound to bilateral feet. Quality: Patient reports experiencing essentially no pain. Severity: Mildly severe wound with no evidence of infection Duration: Patient has had the wound for greater than 2 weeks prior to presenting for treatment HPI Description: The patient is a pleasant 69 yrs old bm here for evaluation of ulcers on the plantar aspect of both feet. 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 He has a left arm graft for access. He has been trying to stay off his feet for weeks but does not seem to have any improvement in the wound. He has been seeing someone at the foot center and was referred to the Wound Care center for further evaluation. 12/30/15 the patient has 3 wounds one over the right first metatarsal head and 2 on the left foot at the left fifth and lleft first metatarsal head. All of these look relatively similar. The one over the left fifth probe to bone I could not prove that any of the others did. I note his MRI in November that did not show osteomyelitis. His peripheral pulses seem robust. All of these underwent surgical debridement to remove callus nonviable skin and subcutaneous tissue 01/06/16; the patient had his sutures removed from the right fifth ray amputation. There may be a small open part of this superiorly but otherwise the incision looks good. Areas over his right first, left first and left fifth metatarsal head all underwent surgical debridement as a varying degree of callus, skin and nonviable subcutaneous tissue. The area that is most worrisome is  the right fifth metatarsal head which has a wound probes precariously close to bone. There is no purulent drainage or erythema  01/13/16; I'm not exactly sure of the status of the right fifth ray amputation site however he follows with Dr. Luiz Blare later this week. The area over his right first plantar metatarsal head, left first and fifth plantar metatarsal head are all in the same status. Thick circumferential callus, nonviable subcutaneous tissue. Culture of the left fifth did not culture last week 01/20/16; all of the patient's wounds appear and roughly the same state although his amputation site on the right lateral foot looks better. His wounds over the right first, left first and left fifth metatarsal heads all underwent difficult surgical debridement removing circumferential callus nonviable skin and subcutaneous tissue. There is no overt evidence of infection in these areas. MRI at the end of December of the left foot did not show osteomyelitis, right foot showed osteomyelitis of the right fifth digit he is now status post amputation. 01/27/16 the patient's wounds over his plantar first and fifth metatarsal heads on the left all appear better having been started on a total contact cast last week. They were debridement of circumferential callus and nonviable subcutaneous tissue as was the wound over the first metatarsal head. His surgical incision on the right also had some light surface debridement done. 03/23/2016 -- the patient was doing really well and most of his wounds had almost completely healed but now he came back today with a history of having a discharge from the area of his right foot on the plantar aspect and also between his first and second toe. He also has had some discharge from the left foot. Addendum: I spoke to the PA Miss Bard Herbert at the dialysis center whose fax number is [phone]. We discussed the infection the patient has and she will put the patient on vancomycin  and Fortaz until the final culture report is back. We will fax this as soon as available. 03/30/2016 -- left foot x-ray IMPRESSION:No definitive osteomyelitis noted. X-ray of the right foot -- IMPRESSION: 1. Soft tissue swelling. Prior amputation right fifth digit. No acute or focal bony abnormality identified. If osteomyelitis remains a clinical concern, MRI can be obtained. 2. Peripheral vascular disease. His culture reports have grown an MSSA -- and we will fax this report to his hemodialysis center. He was called in a prescription of oral doxycycline but I have told her not to fill this in as he is already on IV antibiotics. 04/06/2016 -- a few days ago, I spoke to the hemodialysis center nurse who had stopped the IV antibiotics and he was given a prescription for doxycycline 100 mg by mouth twice a day for a week and he is on this at the present time. 05/11/2016 -- he has recently seen his PCP this week and his hemoglobin A1c was 7. He is working on his paperwork to get his orthotic shoes. 05/25/2016 -- -- x-ray of the left foot IMPRESSION:No acute bony abnormality. No radiographic changes of acute osteomyelitis. No change since prior study. X-ray of the right foot -- IMPRESSION: Postsurgical changes are seen involving the fifth toe. No evidence of acute osteomyelitis. he developed a large blister on the medial part of his right foot and this opened out and drain fluid. 06/01/2016 -- he has his MRI to be done this afternoon. 06/15/2016 -- MRI of the left forefoot without contrast shows 2 separate regions of cutaneous and subcutaneous edema and possible ulceration and blistering along the ball of the foot. No obvious osteomyelitis identified. MRI of the right foot showed cutaneous and subcutaneous thickening  plantar to the first digit sesamoid with an ulcer crater but no underlying osteomyelitis is identified. 06/22/2016 -- the right foot plantar ulcer has been draining a lot of seropurulent  material for the last few days. 06/30/2016 -- spoke to the dialysis center and I believe I spoke to Dillwyn a PA at the center who discussed with me and agreed to putting Legrand Como on vancomycin until his cultures arrive. On review of his culture report no WBCs were seen or no organisms were seen and the culture was reincubated for better growth. The final report is back and there were no predominant growth including Streptococcus or Staphylococcus. Clinically though he has a lot of drainage from both wounds a lot of undermining and there is further blebs on the left foot towards the interspace between his first and second toe. 08/10/2016 -- the culture from the right foot showed normal skin flora and there was no Staphylococcus aureus was group A streptococcus isolated. 09/21/2016 -- -- MRI of the right foot was done on 09/13/2016 - IMPRESSION: Findings most consistent with acute osteomyelitis throughout the great toe, sesamoid bones and plantar aspect of the head of the first metatarsal. Fluid in the sheath of the flexor tendon of the great toe could be sympathetic but is worrisome for septic tenosynovitis. First MTP joint effusion worrisome for septic joint. He was admitted to the hospital on 09/11/2016 and treated for a fever with vancomycin and cefepime. He was seen by Dr. Bobby Rumpf of infectious disease who recommended 6 weeks treatment with vancomycin and ceftazidime with his hemodialysis and to continue to see as in the wound clinic. Vascular consult was pending. The patient was discharged home on 09/14/2016 and he would continue with IV antibiotics for 6 weeks. 09/28/16 wound appears reasonably healthy. Continuing with total contact cast 10/05/16 wound is smaller and looking healthy. Continue with total contact cast. He continues on IV antibiotics 10/12/2016 -- he has developed a new wound on the dorsal aspect of his left big toe and this is a superficial injury with no surrounding  cellulitis. He has completed 30 days of IV antibiotics and is now ready to start his hyperbaric oxygen therapy as per his insurance company's recommendation. 10/19/2016 -- after the cast was removed on the right side he has got good resolution of his ulceration on the right plantar foot. he has a new wound on the left plantar foot in the region of his fourth metatarsal and this will need sharp debridement. 10/26/2016 -- Xray of the right foot complete: IMPRESSION: Changes consistent with osteomyelitis involving the head of the first metatarsal and base of the first proximal phalanx. The sesamoid bones are also likely involved given their positioning. 11/02/2016 -- there still awaiting insurance clearance for his hyperbaric oxygen therapy and hopefully he will begin treatment soon. 11/09/2016 -- started with hyperbaric oxygen therapy and had some barotrauma to the right ear and this was seen by ENT who was prescribed Afrin drops and would probably continue with HBO and he is scheduled for myringotomy tubes on Friday 11/16/2016 -- he had his myringotomy tubes placed on Friday and has been doing much better after that with some fluid draining out after hyperbaric treatment today. The pain was much better. 11/30/2016 -- over the last 2 days he noticed a swelling and change of color of his right second toe and this had been draining minimal fluid. 12/07/2016 -- x-ray of the right foot -- IMPRESSION:1. Progressive ulceration at the distal aspect of the second  digit with significant soft tissue swelling and osseous changes in the distal phalanx compatible with osteomyelitis. 2. Chronic osteomyelitis at the first MTP joint. 3. Ulcerations at the second and third toes as well without definite osseous changes. Osteomyelitis is not excluded. 4. Fifth digit amputation. On 12/01/2016 I spoke to Dr. Bobby Rumpf, the infectious disease specialist, who kindly agreed to treat this with IV antibiotics and he would  call in the order to the dialysis center and this has been discussed in detail with the patient who will make the appropriate arrangements. The patient will also book an appointment as soon as possible to see Dr. [name] in the office. Of note the patient has not been on antibiotics this entire week as the dialysis center did not receive any orders from Dr. [name]. I got in touch with Dr. [name] who tells me the patient has an appointment to see him this coming Wednesday. 12/14/2016 -- the patient is on ceftazidime and vancomycin during his dialysis, and I understand this was put on by his nephrologist Dr. Raliegh Ip possibly after speaking with infectious disease Dr. [name] 12/23/16; patient was on my schedule today for a wound evaluation as he had difficulties with his schedule earlier this week. I note that he is on vancomycin and ceftazidine at dialysis. In spite of this he arrives today with a new wound on the base of the right second toe this easily probes to bone. The known wound at the tip of the second toe With this area. He also has a superficial area on the medial aspect of the third toe on the side of the DIP. This does not appear to have much depth. The area on the plantar left foot is a deep area but did not probe the bone 12/28/16 -- he has brought in some lab work and the most recent labs done showed a hemoglobin of 12.1 hematocrit of 36.3, neutrophils of 51% WBC count of 5.6, BN of 53, albumin of 4.1 globulin of 3.7, vancomycin 13 g per mL. 01/04/2017 -- he saw Dr. [name] who has recommended a amputation of the right second toe and he is awaiting this date. He sees Dr. Bobby Rumpf of infectious disease tomorrow. The bone culture taken on 12/28/2016 had no growth in 2 days. 01/11/2017 -- was seen by Dr. Bobby Rumpf regarding the management and has recommended a eval by vascular surgeons. He recommended to continue the antibiotics during his hemodialysis. Xray of the left foot --  IMPRESSION: No acute fracture, dislocation, or osseous erosion identified. 01/18/2017 -- he has his vascular workup later today and he is going to have his right foot second toe amputation this coming Friday by Dr. [name]. We have also put in for a another 30 treatments with hyperbaric oxygen therapy 01/25/2017 -- I have reviewed Dr. Donnetta Hutching is vascular report from last week where he reviewed him and thought his vascular function was good enough to heal his amputation site and no further tests were recommended. He also had his orthopedic related to surgery which is still pending the notes and we will review these next week. 02/01/2017 -- the operative remote of Dr. 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 02/08/2017 --he was seen by Dr. [name] on 02/03/2017 -- patient is on [place]. after a thorough review he had recommended to continue with antibiotics during hemodialysis. The patient was seen by Dr. [name], but I do not find any follow-up note on the electronic medical record. he has removed the dressing over the right foot to remove the sutures and asked him to see him back in a month's time 02/22/2017 -- patient has been febrile and has been having symptoms of the upper respiratory tract infection but has not been checked for the flu and it's been over 4 days now. He says he is feeling better today. Some drainage between his left first and second toe and this needed to be looked at 03/08/2017 -- was seen by Dr. Bobby Rumpf on 03/07/2017 after review he will stop his antibiotics and see him back in a month to see if he is feeling  well. 03/15/2017 -- he has completed his course of hyperbaric oxygen therapy and is doing fine with his health otherwise. 03/22/2017 -- his nutritionist at the dialysis center has recommended a protein supplement to help build his collagen and we will prescribe this for him when he has the details 05/03/2017 -- he recently noticed the area on the plantar aspect of his fifth metatarsal head which opened out and had minimal drainage. 05/10/2017 -- -- x-ray of the left foot -- IMPRESSION:1. No convincing conventional radiographic evidence of active osteomyelitis.2. Active soft tissue ulceration at the tip of the fifth digit, and at the lateral aspect of the foot adjacent the base of the fifth metatarsal. 3. Surgical changes of prior fourth toe amputation. 4. 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 5. Small vessel atherosclerotic vascular calcifications. 6. 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 ======= Readmission after 5 weeks: 06/15/2017 -- the patient returns after 5 weeks having had an MRI done on 05/17/2017 MRI of the left foot without contrast showed soft tissue ulcer overlying the base of the fifth metatarsal. 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 No drainable fluid collection to suggest an abscess. He was in hospital between 05/16/2017 and 05/25/2017 -- and on discharge was asked to follow-up with Dr. [name] and Dr. [name]. He was treated 2 weeks post discharge with vancomycin plus ceftriaxone with hemodialysis and oral metronidazole 500 mg 3 times a day. 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 last hemoglobin A1c was 6.7% He had a postoperative MR of the foot on 05/23/2017 -- which showed no new areas of cortical bone loss, edema or soft tissue ulceration to  suggest osteomyelitis. the patient has completed his course of IV antibiotics during dialysis and is to see the infectious disease doctor tomorrow. Dr. [name] had seen him after suture removal and asked him to keep the wound with a dry dressing 06/21/2017 -- he was seen by Dr. Bobby Rumpf of infectious disease on 06/16/2017 -- he stopped his Flagyl and will see him back in 6 weeks. He was asked to follow-up with Dr. [name] and with the wound clinic clinic. 07/05/17; bone biopsy from last time showed acute osteomyelitis. This is from the reminiscent in the left fifth metatarsal. Culture result is apparently still pending [holding for anaerobe}. From my understanding in this case this is been a progressive necrotic wound which is deteriorated markedly  over the last 3 weeks since he returned here. He now has a large area of exposed bone which was biopsied and cultured last week. Dr. Graylon Good has put him on vancomycin and Fortaz during his hemodialysis and Flagyl orally. He is to see Dr. [name] next week 07/19/2017 -- the patient was reviewed by Dr. [name] of orthopedics who reviewed the case in detail and agreed with the plan to continue with IV antibiotics, aggressive wound care and hyperbaric oxygen therapy. He would see him back in 3 weeks' time 08/09/2017 -- saw Dr. Bobby Rumpf on 08/03/2017 -he was restarted on his antibiotics for 6 weeks which included vanco, ceftaz and Flagyl. he recommended continuing his antibiotics for 6 weeks and reevaluate his completion date. He would continue with wound care and hyperbaric oxygen therapy. 08/16/2017 -- I understand he will be completing his 6 weeks of antibiotics sometime later this week. 09/19/2017 -- he has been without his wound VAC for the last week due to lack of supplies. He has been packing his wound with silver alginate. 10/04/2017 -- he has an appointment with Dr. [name] tomorrow and hence we will not apply the wound VAC after his dressing  changes today. 10/11/2017 -- he was seen by Dr. Bobby Rumpf on 10/05/2017, noted that the patient was currently off antibiotics, and after thorough review he recommended he follow-up with the wound care as per plan and no further antibiotics at this point. 11/29/17; patient is arrived for the wound on his left lateral foot.Marland Kitchen He is completed IV antibiotics and 2 rounds of hyperbaric oxygen for treatment of underlying osteomyelitis. He arrives today with a surface on most of the wound area however our intake nurse noted drainage from the superior aspect. This was brought to my attention. 12/13/2017 -- the right plantar foot had a large bleb and once the bleb was opened out a large callus and subcutaneous debris was removed and he has a plantar ulcer near the fourth metatarsal head 12/28/17 on evaluation today patient appears to be doing acutely worse in regard to his left foot. The wound which has been appearing to do better as now open up more deeply there is bone palpable at the base of the wound unfortunately. He tells me that this feels "like it did when he had osteomyelitis previously" he also noted that his second toe on the left foot appears to be doing worse and is swollen there does appear to be some fluid collected underneath. His right foot plantar ulcer appears to be doing somewhat better at this point and there really is no complication at the site currently. No fevers, chills, nausea, or vomiting noted at this time. Patient states that he normally has no pain at this site however. T oday he is having significant pain. 01/03/18; culture done last week showed methicillin sensitive staph aureus and group B strep. He was on [place] however he arrives today with a fever of 101. He has a functional dialysis shunt in his left arm but has had no pain here. No cough. He still makes some urine no dysuria he did have abdominal pain nausea and diarrhea over the weekend but is not had any diarrhea since  yesterday. Otherwise he has no specific complaints 01/05/18; the patient returns today in follow-up for his presentation from 1/8. At that point he was febrile. I gave him some Levaquin adjusted for his dialysis status. He tells me the fever broke that night and it is not likely that this was actually  a wound infection. He is gone on to have an MRI of the left foot; this showed interval development of an abnormal signal from the base of the third and fourth metatarsal and in the cuboid reminiscent consistent with osteomyelitis. There is no mention of the left second toe I think which was a concern when it was ordered. The patient is taking his Levaquin 01/10/18; the patient has completed his antibiotics today. The area on the left lateral foot is smaller but it still probes easily to bone. He has underlying osteomyelitis here and sees Dr. Megan Salon of infectious disease this week The area on the right third plantar metatarsal head still requires debridement not much change in dimensions He has a new wound on the medial tip of his right third toe again this probes to bone. He only noticed this 2 days ago 01/17/18; the patient saw Dr. [name] last week and he is back on Vanco and Fortaz at dialysis. 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 We are previously seeing him for a wound also on the base of the fourth med head on the right. X-ray of this area did not show osteomyelitis in relation to a new wound on the tip of his right third toe. Again this probes to bone. Finally his second toe on the left which didn't really show anything on the MRI at least no report appears to have a separated cutaneous area which I think is going to come right off the tip of his toe and leave exposed bone. Whether this is infectious or ischemic I am not clear Culture I did from the third toe last week which was  his new wound was negative. Plain x-ray on the right foot did not show osteomyelitis in the toes 01/24/18; the patient is going went to see Dr. [name]. He is going to have a amputation of the left second and right third toe although paradoxically the left second toe looks better than last week. We have treating a deep probing wound on the left lateral foot and the area over the third metatarsal head on the right foot. 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 Also a debridement including bone of the left lateral foot and a closure which is still sutured. This is a bit surprising. Also apparent debridement of the base of the right third toe wound. 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 I'm not sure exactly where we are in the frame of things here. The wound on the left lateral foot is still sutured also the amputation sites of the left second and right third toe. He is been using Xeroform to the sutured areas silver alginate to the right foot 02/14/18; he continues on Rocephin at dialysis for osteomyelitis. I still don't have a good sense of where we are in the treatment duration. The wounds on the left lateral foot had the sutures removed and this clearly still probes deeply. We debrided the area with a #5 curet. We'll use silver alginate to the wound the area over the right third met head also required debridement of callus skin and subcutaneous tissue and necrotic debris  over the wound surface. This tunnels superiorly but I did not unroofed this today. The areas on the tip of his toes have a crusted surface eschar I did not debridement this either. 02/21/18; he continues on Rocephin at dialysis for osteomyelitis. I don't have a good sense of where we are and the  treatment duration. The wounds on the left lateral foot is callused over on presentation but requires debridement. The area on the right third med head plantar aspect actually is measuring smaller 02/28/18;patient is on Vanco and Fortaz at dialysis, I'm not sure why I thought this was Rocephin above unless it is been changed. I don't have a good sense of time frame here. Using silver collagen to the area on the left lateral foot and right third med head plantar foot 03/07/18; patient is completing bank and Fortaz at dialysis soon. He is using Silver collagen to the area on the left lateral foot and the right third metatarsal head. He has a smaller superficial wound distal to the left lateral foot wound. 03/14/18-he is here in follow-up evaluation for multiple ulcerations to his bilateral feet. He presents with a new ulceration to the plantar aspect of the left foot underneath the blister, this was deroofed to reveal a partial thickness ulcer. He is voicing no complaints or concerns, tolerated dialysis yesterday. We will continue with same treatment plan and he will follow-up next week 03/21/18; the patient has a area on the lateral left foot which still has a small probing area. The overall surface area of the wound is better. He presented with a new ulceration on the left foot plantar fifth metatarsal head last week. The area on the right third metatarsal head appears smaller.using silver alginate to all wound areas. The patient is changing the dressing himself. He is using a Darco forefoot offloading are on the right and a healing sandal on the left 03/28/18; original wound left lateral foot. Still nowhere close to looking like a heeling surface secondary wound on the left lateral foot at the base of his question fifth metatarsal which was new last week Right third metatarsal head. We have been using silver alginate all wounds 04/04/18; the original wound on the left lateral foot again heavy callus  surrounding thick nonviable subcutaneous tissue all requiring debridement. The new wound from 2 weeks ago just near this at the base of the fifth metatarsal head still looks about the same Unfortunately there is been deterioration in the third medical head wound on the right which now probes to bone. I must say this was a superficial wound at one point in time and I really don't have a good frame of reference year. I'll have to go back to look through records about what we know about the right foot. 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 He is also been to see Dr. [name]. Somewhere in here as somebody is ordered a VASCULAR evaluation which was done on 03/22/18. On the right is anterior tibial artery was monophasic triphasic at the posterior tibial artery. On the left anterior tibial artery monophasic posterior tibial artery monophasic. ABI in the right was 1.43 on the left 1.21. TBIs on the right at 0.41 and on the left at 0.32. A vascular consult was recommended and I think has been arranged. 04/11/18; [name] has not had an MRI of the right foot since 2017. Recent x-ray of the right foot  done in January and February was negative. However he has had a major deterioration in the wound over the third met head. He is completed antibiotics last week at dialysis [place]. I think he is going to see vascular in follow-up. The areas on the left lateral foot and left plantar fifth metatarsal head both look satisfactory. I debrided both of these areas although the tissue here looks good. The area on the left lateral foot once probe to bone it certainly does not do that now 04/18/18; MRI of the right foot is on Thursday. He has what looks to be serosanguineous purulent drainage coming out of the wound over the right third metatarsal head today. He completed antibiotics bank and Fortaz 2 weeks ago at  dialysis. He has a vascular follow-up with regards to his arterial insufficiency although I don't exactly see when that is booked. The areas on the left foot including the lateral left foot and the plantar left fifth metatarsal head look about the same. 04/25/18-He is here in follow-up evaluation for bilateral foot ulcers. MRI obtained was negative for osteomyelitis. Wound culture was negative. We will continue with same treatment plan he'll follow-up next week 05/02/18; the right plantar foot wound over the third metatarsal head actually looks better than when I last saw this. His MRI was negative for osteomyelitis wound culture was negative. On the left plantar foot both wounds on the plantar fifth metatarsal head and on the lateral foot both are covered in a very hard circumferential callus. 05/09/18; right plantar foot wound over the third metatarsal head stable from last week. Left plantar foot wound over the fifth metatarsal head also stable but with callus around both wound areas The area on the left lateral foot had thick callus over a surface and I had some thoughts about leaving this intact however it felt boggy. We've been using silver alginate all wounds 05/16/18; since the patient was last here he was hospitalized from 5/15 through 5/19. 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 This grew MRSA. He was placed on vancomycin.MRI of the left foot suggested "progressive" osteomyelitis and bone destruction of the cuboid and the base of the fifth metatarsal. Stable erosive changes of the base of the second metatarsal. I'm wondering if they're aware that he had surgery and debridement in the area of the underlying bone previously by Dr. [name]. In any case, He was also revascularized with an angioplasty and stenting of the left tibial peroneal trunk and angioplasty of the left posterior tibial  artery. He is on vancomycin at dialysis. He has a 2 week follow-up with infectious disease. He has vascular surgery follow-up. He was started on Plavix. 05/23/18; the patient's wound on the lateral left foot at the level of the fifth metatarsal head and the plantar wound on the plantar fifth metatarsal head both look better. The area on the right third metatarsal head still has depth and undermining. We've been using silver alginate. He is on vancomycin. He has been revascularized on the left 05/30/18; he continues on vancomycin at dialysis. Revascularized on the left. The area on the left lateral foot is just about closed. Unfortunately the area over the plantar fifth metatarsal head undermining superiorly as does the area over the right third metatarsal head. Both of these significantly deteriorated from last week. He has appointments next week with infectious disease and orthopedics I  delayed putting on a cast on the left until those appointments which therefore we made we'll bring him in on Friday the 14th with the idea of a cast on the left foot 06/06/18; he continues on vancomycin at dialysis. Revascularized on the left. He arrives today with a ballotable swelling just above the area on the left lateral foot where his previous wound was. He has no pain but he is reasonably insensate. The difficult areas on the plantar left fifth metatarsal head looks stable whereas the area on the right third plantar metatarsal head about the same as last week there is undermining here although I did not unroofed this today. He required a considerable debridement of the swelling on the left lateral foot area and I unroofed an abscess with a copious amount of brown purulent material which I obtained for culture. I don't believe during his recent hospitalization he had any further imaging although I need to review this. Previous cultures from this area done in this clinic showed MRSA, I would be surprised if this is  not what this is currently even though he is on vancomycin. 06/13/18; he continues on vancomycin at dialysis however he finishes this on Sunday and then graduates the doxycycline previously prescribed by Dr. [name]. The abscess site on the lateral foot that I unroofed last time grew moderate amounts of methicillin-resistant staph aureus that is both vancomycin and tetracycline sensitive so we should be okay from that regard. He arrives today in clinic with a connection between the abscess site and the area on the lateral foot. We've been using silver alginate to all his wound areas. The right third metatarsal head wound is measuring smaller. Using silver alginate on both wound areas 06/20/18; transitioning to doxycycline prescribed by Dr. [name]. The abscess site on the lateral foot that I unroofed 2 weeks ago grew MRSA. That area has largely closed down although the lateral part of his wound on the fifth metatarsal head still probes to bone. I suspect all of this was connected. On the right third metatarsal head smaller looking wound but surrounded by nonviable tissue that once again requires debridement using silver alginate on both areas 06/27/18; on doxycycline 100 twice a day. The abscess on the lateral foot has closed down. Although he still has the wound on the left fifth metatarsal head that extends towards the lateral part of the foot. The most lateral part of this wound probes to bone Right third metatarsal head still a deep probing wound with undermining. Nevertheless I elected not to debridement this this week If everything is copious static next week on the left I'm going to attempt a total contact cast 07/04/18; he is tolerating his doxycycline. The abscess on the left lateral foot is closed down although there is still a deep wound here that probes to bone. We will use silver collagen under the total contact cast Silver collagen to the deep wound over the right third metatarsal head is  well 07/11/18; he is tolerating doxycycline. The left lateral fifth plantar metatarsal head still probes deeply but I could not probe any bone. We've been using silver collagen and this will be the second week under the total contact cast Silver collagen to deep wound over the right third metatarsal head as well 07/18/18; he is still taking doxycycline. The left lateral fifth plantar metatarsal this week probes deeply with a large amount of exposed bone. Quite a deterioration. He required extensive debridement. Specimens of bone for pathology and CNS obtained  Also considerable debridement on the right third metatarsal head 07/25/18; bone for pathology last week from the lateral left foot showed osteomyelitis. CandS of the bone Enterobacter. And Klebsiella. He is now on ciprofloxacin and the doxycycline is stopped [Dr. [name] of infectious disease] area He has an appointment with Dr. [name] tomorrow I'm going to leave the total contact cast off for this week. May wish to try to reapply that next week or the week after depending on the wound bed looks. I'm not sure if there is an operative option here, previously is followed with Dr. [name] 08/01/18 on evaluation today patient presents for reevaluation. He has been seen by infectious disease, Dr. [name], and he has placed the patient back on Ceftaz currently to be given to him at dialysis three times a week. 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 Fortunately there does not appear to be the evidence of infection at this point in time. Overall the patient has been tolerating the dressing changes without complication. We are no longer due lives in the cast of left foot secondary to the infection obviously. 08/10/18 on evaluation today patient appears to be doing rather well all things considered in regard to his left plantar foot ulcer. He does have a significant callous on the  lateral portion of his left foot he wonders if I can help clean that away to some degree today. Fortunately he is having no evidence of infection at this time which is good news. No fevers chills noted. With that being said his right plantar foot actually does have a significant callous buildup around the wound opening this seems to not be doing as well as I would like at this point. I do believe that he would benefit from sharp debridement at this site. Subsequently I think a total contact cast would be helpful for the right foot as well. 08/17/18; the patient arrived today with a total contact cast on the right foot. This actually looks quite good. He had gone to see Dr. Megan Salon of infectious disease about the osteomyelitis on the left foot. I think he is on IV Fortaz at dialysis although I am not exactly sure of the rationale for the Fortaz at the time of this dictation. He also arrived in today with a swelling on the lateral left foot this is the site of his original wounds in fact when I first saw this man when he was under Dr. Ardeen Garland care I think this was the site of where the wound was located. It was not particularly tender however using a small scalpel I opened it to [place] some moderate amount of purulent drainage. 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 Last MRI was in May that showed progressive osteomyelitis and bone destruction of the cuboid and base of the fifth metatarsal. Last week he had an abscess over this same area this was removed. Culture was negative. We are continuing with a total contact cast to the area on the third metatarsal head on the right and making some good progress here. The patient asked me again about renal transplant. He is not on the list because of the open wounds. 08/31/18; apparently the patient had an interruption in the [place] but  he is now back on this at dialysis. Apparently there was a  misunderstanding and Dr. Crissie Figures orders. Due to have the MRI of the left foot tonight. The area on the right foot continues to have callus thick skin and subcutaneous tissue around the wound edge that requires constant debridement however the wound is smaller we are using a total contact cast in this area. Alginate all wounds 09/07/18; without much surprise the MRI of his left foot showed osteomyelitis in the reminiscent of his fourth metatarsal but also the third metatarsal. She arrives in clinic today with the right foot and a total contact cast. There was purulent drainage coming out of the wound which I have cultured. Marked deterioration here with undermining widely around the wound orifice. Using pickups and a scalpel I remove callus and subcutaneous tissue from a substantial new opening. In a similar fashion the area on the left lateral foot that was blistered last week I have opened this and remove skin and subcutaneous tissue from this area to expose a obvious new wound. I think there is extension and communication between all of this on the left foot. The patient is on Fortaz at dialysis. Culture done of the right foot. We are clearly not making progress here. We have made him an appointment with Dr. [name] of orthopedics. I think an amputation of the left leg may be discussed. I don't think there is anything that can be done with foot salvage. 09/14/18; culture from the right foot last time showed a very resistant MRSA. This is resistant to doxycycline. I'm going to try to get linezolid at least for a week. He does not have a appointment with Dr. [name] yet. 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 I'll send a message to Dr. [name] of infectious disease. I want the patient to see Dr. [name] to go over the pros and cons of an amputation which I think will be a BKA. The  patient had a question about whether this is curative or not. I told him that I thought it would be although spread of staph aureus infection is not unheard of. MRI of the right foot was done at the end of April 2019 did not show osteomyelitis. The patient last saw Dr. [name] on 9/3. I'll send Dr. [name] a message. I would really like him to weigh the pros and cons of a BKA on the left otherwise he'll probably need IV antibiotics and perhaps hyperbaric oxygen again. He has not had a good response to this in the past. His MRI earlier this month showed progressive damage in the remnants of the fourth and third metatarsal 09/21/2018; sees Dr. [name] of orthopedics next week to discuss a left BKA in response to the osteomyelitis in the left foot. Using silver alginate to all wound areas. He is completing the Zyvox I did put in for him after last culture showed MRSA 09/29/2018; patient saw Dr. [name] of orthopedics discussed the osteomyelitis in the left foot third and fourth metatarsals. He did not recommend urgent surgery but certainly stated the only surgical option would be a BKA. He is communicating with Dr. [name]. The problem here is the instability of the areas on his foot which constantly generate draining abscesses. I will communicate with Dr. [name] about this. He has an appointment on 10/23 10/05/2018; patient sees Dr. [name] on 10/23. I have sent him a secure message not ordered any additional antibiotics for now. Patient continues to have 2 open areas on the left  plantar foot at the fifth metatarsal head and the lateral aspect of the foot. These have not changed all that much. The area on the right third met head still has thick callus and surrounding subcutaneous tissue we have been using silver alginate 10/12/2018; patient sees Dr. [name] or colleague on 10/23. 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 The area on the right  plantar foot is just about closed. The superior wound on the left fifth metatarsal head is callused over but I am not sure if this is closed. The area below it is about the same. Considerable amount of callus on the lateral foot. We have been using silver alginate to the wounds 10/19/2018; the patient saw Dr. [name] on 10/22. He wishes to try and continue to save the left foot. He has been given 6 weeks of oral ciprofloxacin. We continue to put a total contact cast on the right foot. The area over the fifth metatarsal head on the left is callused over/perhaps healed but I did not remove the callus to find out. He still has the wound on the left lateral midfoot requiring debridement. We have been using silver alginate to all wound areas 10/26/2018 On the left foot our intake nurse noted some purulent drainage from the inferior wound [currently the only one that is not callused over] On the right foot even though he is in total contact cast a considerable amount of thick black callus and surface eschar. On this side we have been using silver alginate under a total contact cast. he remains on ciprofloxacin as prescribed by Dr. [name] 11/02/2018; culture from last week which is done of the probing area on the left midfoot wound showed MRSA "few". I am going to need to contact Dr. [name] which I will do today I think he is going to need IV antibiotics again. The area on the left foot which was callused on the side is clearly separating today all of this was removed a copious amounts of callus and necrotic subcutaneous tissue. The area on the right plantar foot actually remained healthy looking with a healthy granulated base. We are using silver alginate to all wound areas 11/09/2018; unfortunately neither 1 of the patient's wounds areas looks at all satisfactory. On the left he has considerable necrotic debris from the plantar wound laterally over the foot. I removed copious amounts of material  including callus skin and subcutaneous tissue. On the right foot he arrives with undermining and frankly purulent drainage. Specimen obtained for culture and debridement of the callused skin and subcutaneous tissue from around the circumference. Because of this I cannot put him back in a total contact cast but to be truthful we are really unfortunately not making a lot of progress I did put in a secure text message to Dr. [name] wondering about the MRSA on the left foot. He suggested vancomycin although this is not started. I was left wondering if he expected me to send this into dialysis. Has we have more purulence on the right side wait for that result before calling dialysis 11/16/2018; I called dialysis earlier this week to get the vancomycin started. I think he got the first dose on Tuesday. The patient tells me that he fell earlier this week twisting his left foot and ankle and he is swelling. He continues to not look well. He had blood cultures done at dialysis on Tuesday he is not been informed of the  results. 12/07/2018; the patient was admitted to hospital from 11/22/2018 through 12/02/2018. He underwent a left BKA. Apparently had staph sepsis. In the meantime being off the right foot this is closed over. He follows up with Dr. 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 Subsequent to this he actually had multiple falls in fact he tells me to following which calls the wound to be his apparently. He has been seeing Dr. [name] and his physician assistant in the interim. They actually did want him to come back for reevaluation to see if there's anything we can do for me wound care perspective the help this area to heal more appropriately. The patient states that he does not have a tremendous amount of pain which is good news. No fevers, chills, nausea, or vomiting noted at this time.  We have gotten approval from Dr. [name] office had [place] for [place] to treat the patient for his stop wound. This is due to the fact that the patient was in the 90 day postop global. 1/21; the patient does not have an open wound on the right foot although he does have some pressure areas that will need to be padded when he is transferring. The dehisced surgical wound looks clean although there is some undermining of 1.5 cm superiorly. 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 This appears healthy but there is undermining. TheraSkin #1 2/11; TheraSkin #2 2/25; TheraSkin #3. Wound is measuring smaller 3/10; TheraSkin #4 wound is measuring smaller 3/24; TheraSkin #5 wound is measuring smaller and looks healthy. 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 He has no open area on the right foot although he does have some callused area over the original third metatarsal head wound. The third metatarsal is also subluxed on the right foot. I have warned him today that he cannot consider wearing a prosthesis for at least a month but he can go for measurements. He is going to need to keep the right foot padded is much as possible in his diabetic shoe indefinitely READMISSION 06/12/2019 [name] is a type II diabetic on dialysis. He has been in this clinic multiple times with wounds on his bilateral lower extremities. 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 He also has a history of wounds on his right foot but tells me that everything is going well here. He actually came in here with his prosthesis walking with a cane. We were all really quite gratified to see this He states over the last several weeks he has had a painful area at the tip of the left third finger. His dialysis shunt in his is in  the left upper arm and this particularly hurts during dialysis when his fingers get numb and there is a lot of pain in the tip of his left third finger. I believe he is seen vascular surgery. He had a Doppler done to evaluate for a dialysis steal syndrome. He is going for a banding procedure 1 week from today towards the left AV fistula. I think if that is unsuccessful they will be looking at creating a new shunt. He had a course of doxycycline when he which he  finished about a week ago. He has been using Bactroban to the finger 6/25; the patient had his banding procedure and things seem to be going better. He is not having pain in his hand at dialysis. The area on the tip of his finger seems about closed. He did however have a paronychia I the last time I saw him. I have been advising him to use topical Bactroban washing the finger. Culture I did last time showed a few staph epidermidis which I was willing to dismiss as superficial skin contaminant. He arrives today with the finger wound looking better however the paronychia a seems worse 7/9; 2-week follow-up. He does not have an open wound on the tip of his third left finger. I thought he had an initial ischemic wound on the tip of the finger as well as a paronychia a medially. All of this appears to be closed. 8/6-Patient returns after 3 weeks for follow-up and has a right second met head plantar callus with a significant area of maceration hiding an ulcer ABI repeated today on right - 1.26 8/13-Patient returns at 1 week, the right second met head plantar wound appears to be slightly better, it is surrounded by the callus area we are using silver alginate 8/20; the patient came back to clinic 2 weeks ago with a wound on the right second metatarsal head. He apparently told me he developed callus in this area but also went away from his custom shoes to a pair of running shoes. Using silver alginate. He of course has the left BKA and prosthesis on  the left side. There might be much we can do to offload this although the patient tells me he has a wheelchair that he is using to try and stay off the wound is much as possible 8/27; right second metatarsal head. Still small punched out area with thick callus and subcutaneous tissue around the wound. We have been using silver collagen. This is always been an issue with this man's wound especially on this foot 9/3; right second met head. Still the same punched-out area with thick callus and subcutaneous tissue around the wound removing the circumference demonstrates repetitively undermining area. I have removed all the subcutaneous tissue associated with this. We have been using silver collagen 9/15; right second metatarsal head. Same punched-out thick callus and subcutaneous tissue around the wound area. Still requiring debridement we have been using silver collagen I changed back to silver alginate X-ray last time showed no evidence of osteomyelitis. Culture showed Klebsiella and he was started and Keflex apparently just 4 days ago 9/24; right second metatarsal head. He is completed the antibiotics I gave him for 10 days. Same punched-out thick callus and subcutaneous tissue around the area. Still requiring debridement. We have been using silver alginate. The area itself looks swollen and this could be just [place] that comes on this area with walking on a prosthesis on the left however I think he needs an MRI and I am going to order that today. There is not an option to offload this further 10/1; right second plantar metatarsal head. MRI booked for October 6. Generally looking better today using silver alginate 10/8; right second plantar metatarsal head. MRI that was done on 10/6 was negative for osteomyelitis noted prior amputations of the second and fourth toes at the MTP. Prior amputation of the third toe to the base of the middle phalanx. Prior amputation of the fifth toe to the head of the  metatarsal. 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 He does not recall about hearing about this. He did not have any pain but then again he has reduced sensation from diabetic neuropathy 10/15; right second plantar metatarsal head. Still in the same condition callus nonviable subcutaneous tissue which cleans up nicely with debridement but reforms by the next week. The patient tells me that he is offloading this is much as he can including taking his wheelchair to dialysis. We have been using silver alginate, changed to silver collagen today 10/22; right second plantar metatarsal head. Wound measures smaller but still thick callus around this wound. Still requiring debridement 11/5 right second plantar metatarsal head. Less callus around the wound circumference but still requiring debridement. There is still some undermining which I cleaned out the wound looks healthy but still considerable punched out depth with a relatively small circumference to the wound there is no palpable bone no purulent drainage 11/12; right second plantar metatarsal head. Small wound with thick callus tissue around the wound and significant undermining. We have been using silver collagen after my continuous debridements of this area 11/19; patient arrives in today with purulent drainage coming out of the wound in the second third met head area on the right foot. Serosanguineous thick drainage. Specimen obtained for culture. 11/21/2019 on evaluation today patient appears to be doing well with regard to his foot ulcer. The good news is is culture came back negative for any bacteria there was no growth. The other good news is his x-ray also appears to be doing well. The foot is also measuring much better than what it was. Overall I am very pleased with how things seem to be progressing. 12/22; patient was in [facility] for several weeks due to the death of a family  member. He said he stayed off the wound using silver alginate. 01/03/2020. Patient has a small open area with roughly 0.3 cm of circumferential undermining. The orifice looks smaller. We have been using silver collagen. There is not a wound more aggressive way to offload this area as he has a prosthesis on the other leg 1/21; again the same small area is 2 weeks ago. We have been using silver collagen I change that to endoform today I really believe that this is an offloading issue 2/4; the area on the right third met head. We have been using endoform. 2/11 the area on the right third met head we have been using silver alginate. 2/25; the area on the right third met head. There is much less callus on this today. The patient states he is offloading this even more aggressively. As an example he is taking his wheelchair into dialysis on most days 3/4; right third metatarsal head. Again he has eschar over the surface of the wound which I removed with a #3 curette. Some subcutaneous debris. This has 0.8 cm of direct probing depth. I cannot feel any bone here. I did do a culture the area 3/11; right third metatarsal head. Again an eschared area over the wound which almost makes this look superficial. Again debridement reveals a wound with probing depth probably not much different from last week there is no palpable bone no palpable purulence. The patient tells me that he is making every effort to offload this area properly. He is taking wheelchair into dialysis etc. There is no option for forefoot offloading or a total contact cast. We used Oasis #1 today 3/18; again callus and eschar over  the wound surface. I remove this but still a probing wound although only 3 mm in depth this time. This is an improvement 3/25; again callus and eschar over the wound surface which I removed the wound is much deeper today at 0.7 cm. There is no palpable bone. Because of the appearance of this a culture was done. I am not  going to put the Oasis back in this. Concerned about underlying infection. Notable for the fact that he is just finished doxycycline for the last go round of this 3/30; still 0.7 cm in depth which is disappointing. Culture I gave last time showed a few Staph aureus which is methicillin susceptible. This should have been taken care of by the recent course of doxycycline I gave him. We are using silver alginate 4/6; almost 1 cm in depth. We have been using silver alginate with underlying Bactroban. 4/15; about 1 cm in depth still. There is no palpable bone but there is undermining thick surface again as usual. Our intake nurse noted what looked to be purulent drainage I suppose that could have been Bactroban but I did a culture for CandS 4/20; depth down 0.7 cm. Culture from last time was negative we have been using Bactroban and silver alginate 4/29; depth down 0.5 cm. Using silver alginate. He reports some drainage 5/6; comes in with the wound worse. Wound is deeper and tunnels. Measures 0.9 depth and an undermining area of 1.8 cm. 5/18; there is a change the dressing last week. Still using Sorbact hydrogel. What I can see of the base of the wound looks better. 6/1. Not much change. I changed him to endoform today. He has 1.1 cm in depth no palpable bone 6/8; again a small punched out wound with significant undermining. Culture I did last week was negative. T oday he had some purulent drainage perhaps mixed with some retained endoform. 6/22; MRI report below IMPRESSION: Skin ulceration on the plantar surface of the foot at the level of the head of the second metatarsal. Mild marrow edema in the head of the second metatarsal could be reactive or due to osteomyelitis. Thin fluid collection surrounding the head and neck of the second metatarsal appears to communicate with the patient's skin wound and is worrisome for a septic collection. Advanced first MTP osteoarthritis. Prior amputations as  noted above. Electronically Signed By: Inge Rise M.D. On: 06/16/2020 11:51 PCR culture I did last time I believe showed peptostreptococcus. There was no comment on any other gram-negative or gram-positive organisms. I gave him a course of Flagyl which I think he is finishing up. We are not making any progress with the wound over the right second/third metatarsal head. We are using silver alginate putting him in kerlix Coban 6/29; has his appointment with Dr. [name] of orthopedics next Tuesday I think we see him on the same day. He was in for a nurse visit today. Nurse noted undermining of skin and callus so I was asked to look at this. We have been using silver alginate and kerlix Coban Electronic Signature(s) Signed: 06/25/2020 3:41:00 PM By: Linton Ham MD Entered By: Linton Ham on 06/24/2020 12:50:46 -------------------------------------------------------------------------------- Physical Exam Details Patient Name: Date of Service: [name] Shines B. 06/24/2020 10:30 A M Medical Record Number: [id number] Patient Account Number: [id number] Date of Birth/Sex: Treating RN: 19-Oct-1951 (68 y.o. [name] Primary Care Provider: [name] Other Clinician: Referring Provider: Treating Provider/Extender: Tanna Savoy in Treatment: 44 Constitutional Sitting or standing Blood Pressure  is within target range for patient.. Pulse regular and within target range for patient.Marland Kitchen Respirations regular, non-labored and within target range.. Temperature is normal and within the target range for the patient.Marland Kitchen Appears in no distress. Notes Exam; again a small punched out area. Callus and underlying skin probing superiorly. I used pickups and a #10 blade to remove all of this also some subcutaneous tissue. I am able to get this wound to clean up quite nicely. No palpable bone but again I cannot get this to close Electronic Signature(s) Signed: 06/25/2020 3:41:00  PM By: Linton Ham MD Entered By: Linton Ham on 06/24/2020 12:53:25 -------------------------------------------------------------------------------- Physician Orders Details Patient Name: Date of Service: [name] Shines B. 06/24/2020 10:30 A M Medical Record Number: [id number] Patient Account Number: [id number] Date of Birth/Sex: Treating RN: October 27, 1951 (68 y.o. 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 Nurse Visit: - 1 week Dressing Change Frequency Wound #41 Right Metatarsal head second Do not change entire dressing for one week. Skin Barriers/Peri-Wound Care Moisturizing lotion - right leg. Wound Cleansing May shower with protection. - patient to use a cast protector. Primary Wound Dressing Wound #41 Right Metatarsal head second Calcium Alginate with Silver Secondary Dressing Wound #41 Right Metatarsal head second Dry Gauze Other: - felt to offload periwound. Edema Control Kerlix and Coban - Right Lower Extremity Avoid standing for long periods of time Elevate legs to the level of the heart or above for 30 minutes daily and/or when sitting, a frequency of: - throughout the day. Additional Orders / Instructions Other: - minimize walking and standing on foot to aid in wound healing and callus buildup. Electronic Signature(s) Signed: 06/24/2020 5:22:48 PM By: Carlene Coria RN Signed: 06/25/2020 3:41:00 PM By: Linton Ham MD Entered By: Carlene Coria on  06/24/2020 12:44:36 -------------------------------------------------------------------------------- Problem List Details Patient Name: Date of Service: [name] Shines B. 06/24/2020 10:30 A M Medical Record Number: [id number] Patient Account Number: [id number] Date of Birth/Sex: Treating RN: 05/13/1951 (68 y.o. 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, other extremity 06/12/2019 No Yes L03.115 Cellulitis of right lower limb 11/15/2019 No Yes Inactive Problems ICD-10 Code Description Active Date Inactive Date L03.114 Cellulitis of left upper limb 06/12/2019 06/12/2019 L98.498 Non-pressure chronic ulcer of skin of other sites with other specified severity 06/12/2019 06/12/2019 S61.209D Unspecified open wound of unspecified finger without damage to nail, subsequent 06/12/2019 06/12/2019 encounter Resolved Problems Electronic Signature(s) Signed: 06/25/2020 3:41:00 PM By: Linton Ham MD Entered By: Linton Ham on 06/24/2020 12:45:38 -------------------------------------------------------------------------------- Progress Note Details Patient Name: Date of Service: [name] Shines B. 06/24/2020 10:30 A M Medical Record Number: [id number] Patient Account Number: [id number] Date of Birth/Sex: Treating RN: 01-25-1951 (68 y.o. [name] Primary Care Provider: [name] Other Clinician:  Referring Provider: Treating Provider/Extender: Marlyce Huge in Treatment: 54 Subjective History of Present Illness (HPI) The  following HPI elements were documented for the patient's wound: Location: Patient presents with a wound to bilateral feet. Quality: Patient reports experiencing essentially no pain. Severity: Mildly severe wound with no evidence of infection Duration: Patient has had the wound for greater than 2 weeks prior to presenting for treatment The patient is a pleasant 69 yrs old bm here for evaluation of ulcers on the plantar aspect of both feet. 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 He has a left arm graft for access. He has been trying to stay off his feet for weeks but does not seem to have any improvement in the wound. He has been seeing someone at the foot center and was referred to the Wound Care center for further evaluation. 12/30/15 the patient has 3 wounds one over the right first metatarsal head and 2 on the left foot at the left fifth and lleft first metatarsal head. All of these look relatively similar. The one over the left fifth probe to bone I could not prove that any of the others did. I note his MRI in November that did not show osteomyelitis. His peripheral pulses seem robust. All of these underwent surgical debridement to remove callus nonviable skin and subcutaneous tissue 01/06/16; the patient had his sutures removed from the right fifth ray amputation. There may be a small open part of this superiorly but otherwise the incision looks good. Areas over his right first, left first and left fifth metatarsal head all underwent surgical debridement as a varying degree of callus, skin and nonviable subcutaneous tissue. The area that is most worrisome is the right fifth metatarsal head which has a wound probes precariously close to bone. There is no purulent drainage or erythema 01/13/16; I'm not exactly sure of the status of the right fifth ray amputation site however he follows with Dr. Luiz Blare later this week. The area over his right  first plantar metatarsal head, left first and fifth plantar metatarsal head are all in the same status. Thick circumferential callus, nonviable subcutaneous tissue. Culture of the left fifth did not culture last week 01/20/16; all of the patient's wounds appear and roughly the same state although his amputation site on the right lateral foot looks better. His wounds over the right first, left first and left fifth metatarsal heads all underwent difficult surgical debridement removing circumferential callus nonviable skin and subcutaneous tissue. There is no overt evidence of infection in these areas. MRI at the end of December of the left foot did not show osteomyelitis, right foot showed osteomyelitis of the right fifth digit he is now status post amputation. 01/27/16 the patient's wounds over his plantar first and fifth metatarsal heads on the left all appear better having been started on a total contact cast last week. They were debridement of circumferential callus and nonviable subcutaneous tissue as was the wound over the first metatarsal head. His surgical incision on the right also had some light surface debridement done. 03/23/2016 -- the patient was doing really well and most of his wounds had almost completely healed but now he came back today with a history of having a discharge from the area of his right foot on the plantar aspect and also between his first and second toe. He also has had some discharge from the left foot. Addendum: I spoke to the PA  [name] at the dialysis center whose fax number is [phone]. We discussed the infection the patient has and she will put the patient on vancomycin and Fortaz until the final culture report is back. We will fax this as soon as available. 03/30/2016 -- left foot x-ray IMPRESSION:No definitive osteomyelitis noted. X-ray of the right foot -- IMPRESSION: 1. Soft tissue swelling. Prior amputation right fifth digit. No acute or focal  bony abnormality identified. If osteomyelitis remains a clinical concern, MRI can be obtained. 2. Peripheral vascular disease. His culture reports have grown an MSSA -- and we will fax this report to his hemodialysis center. He was called in a prescription of oral doxycycline but I have told her not to fill this in as he is already on IV antibiotics. 04/06/2016 -- a few days ago, I spoke to the hemodialysis center nurse who had stopped the IV antibiotics and he was given a prescription for doxycycline 100 mg by mouth twice a day for a week and he is on this at the present time. 05/11/2016 -- he has recently seen his PCP this week and his hemoglobin A1c was 7. He is working on his paperwork to get his orthotic shoes. 05/25/2016 -- -- x-ray of the left foot IMPRESSION:No acute bony abnormality. No radiographic changes of acute osteomyelitis. No change since prior study. X-ray of the right foot -- IMPRESSION: Postsurgical changes are seen involving the fifth toe. No evidence of acute osteomyelitis. he developed a large blister on the medial part of his right foot and this opened out and drain fluid. 06/01/2016 -- he has his MRI to be done this afternoon. 06/15/2016 -- MRI of the left forefoot without contrast shows 2 separate regions of cutaneous and subcutaneous edema and possible ulceration and blistering along the ball of the foot. No obvious osteomyelitis identified. MRI of the right foot showed cutaneous and subcutaneous thickening plantar to the first digit sesamoid with an ulcer crater but no underlying osteomyelitis is identified. 06/22/2016 -- the right foot plantar ulcer has been draining a lot of seropurulent material for the last few days. 06/30/2016 -- spoke to the dialysis center and I believe I spoke to [place] a PA at the center who discussed with me and agreed to putting Legrand Como on vancomycin until his cultures arrive. On review of his culture report no WBCs were seen or no organisms  were seen and the culture was reincubated for better growth. The final report is back and there were no predominant growth including Streptococcus or Staphylococcus. Clinically though he has a lot of drainage from both wounds a lot of undermining and there is further blebs on the left foot towards the interspace between his first and second toe. 08/10/2016 -- the culture from the right foot showed normal skin flora and there was no Staphylococcus aureus was group A streptococcus isolated. 09/21/2016 -- -- MRI of the right foot was done on 09/13/2016 - IMPRESSION: Findings most consistent with acute osteomyelitis throughout the great toe, sesamoid bones and plantar aspect of the head of the first metatarsal. Fluid in the sheath of the flexor tendon of the great toe could be sympathetic but is worrisome for septic tenosynovitis. First MTP joint effusion worrisome for septic joint. He was admitted to the hospital on 09/11/2016 and treated for a fever with vancomycin and cefepime. He was seen by Dr. Bobby Rumpf of infectious disease who recommended 6 weeks treatment with vancomycin and ceftazidime with his hemodialysis and to continue to see as  in the wound clinic. Vascular consult was pending. The patient was discharged home on 09/14/2016 and he would continue with IV antibiotics for 6 weeks. 09/28/16 wound appears reasonably healthy. Continuing with total contact cast 10/05/16 wound is smaller and looking healthy. Continue with total contact cast. He continues on IV antibiotics 10/12/2016 -- he has developed a new wound on the dorsal aspect of his left big toe and this is a superficial injury with no surrounding cellulitis. He has completed 30 days of IV antibiotics and is now ready to start his hyperbaric oxygen therapy as per his insurance company's recommendation. 10/19/2016 -- after the cast was removed on the right side he has got good resolution of his ulceration on the right plantar  foot. he has a new wound on the left plantar foot in the region of his fourth metatarsal and this will need sharp debridement. 10/26/2016 -- Xray of the right foot complete: IMPRESSION: Changes consistent with osteomyelitis involving the head of the first metatarsal and base of the first proximal phalanx. The sesamoid bones are also likely involved given their positioning. 11/02/2016 -- there still awaiting insurance clearance for his hyperbaric oxygen therapy and hopefully he will begin treatment soon. 11/09/2016 -- started with hyperbaric oxygen therapy and had some barotrauma to the right ear and this was seen by ENT who was prescribed Afrin drops and would probably continue with HBO and he is scheduled for myringotomy tubes on Friday 11/16/2016 -- he had his myringotomy tubes placed on Friday and has been doing much better after that with some fluid draining out after hyperbaric treatment today. The pain was much better. 11/30/2016 -- over the last 2 days he noticed a swelling and change of color of his right second toe and this had been draining minimal fluid. 12/07/2016 -- x-ray of the right foot -- IMPRESSION:1. Progressive ulceration at the distal aspect of the second digit with significant soft tissue swelling and osseous changes in the distal phalanx compatible with osteomyelitis. 2. Chronic osteomyelitis at the first MTP joint. 3. Ulcerations at the second and third toes as well without definite osseous changes. Osteomyelitis is not excluded. 4. Fifth digit amputation. On 12/01/2016 oo I spoke to Dr. Bobby Rumpf, the infectious disease specialist, who kindly agreed to treat this with IV antibiotics and he would call in the order to the dialysis center and this has been discussed in detail with the patient who will make the appropriate arrangements. The patient will also book an appointment as soon as possible to see Dr. [name] in the office. Of note the patient has not been on  antibiotics this entire week as the dialysis center did not receive any orders from Dr. [name]. I got in touch with Dr. [name] who tells me the patient has an appointment to see him this coming Wednesday. 12/14/2016 -- the patient is on ceftazidime and vancomycin during his dialysis, and I understand this was put on by his nephrologist Dr. Raliegh Ip possibly after speaking with infectious disease Dr. [name] 12/23/16; patient was on my schedule today for a wound evaluation as he had difficulties with his schedule earlier this week. I note that he is on vancomycin and ceftazidine at dialysis. In spite of this he arrives today with a new wound on the base of the right second toe this easily probes to bone. The known wound at the tip of the second toe With this area. He also has a superficial area on the medial aspect of the third toe on  the side of the DIP. This does not appear to have much depth. The area on the plantar left foot is a deep area but did not probe the bone 12/28/16 -- he has brought in some lab work and the most recent labs done showed a hemoglobin of 12.1 hematocrit of 36.3, neutrophils of 51% WBC count of 5.6, BN of 53, albumin of 4.1 globulin of 3.7, vancomycin 13 g per mL. 01/04/2017 -- he saw Dr. [name] who has recommended a amputation of the right second toe and he is awaiting this date. He sees Dr. Bobby Rumpf of infectious disease tomorrow. The bone culture taken on 12/28/2016 had no growth in 2 days. 01/11/2017 -- was seen by Dr. Bobby Rumpf regarding the management and has recommended a eval by vascular surgeons. He recommended to continue the antibiotics during his hemodialysis. Xray of the left foot -- IMPRESSION: No acute fracture, dislocation, or osseous erosion identified. 01/18/2017 -- he has his vascular workup later today and he is going to have his right foot second toe amputation this coming Friday by Dr. [name]. We have also put in for a another 30 treatments with  hyperbaric oxygen therapy 01/25/2017 -- I have reviewed Dr. Donnetta Hutching is vascular report from last week where he reviewed him and thought his vascular function was good enough to heal his amputation site and no further tests were recommended. He also had his orthopedic related to surgery which is still pending the notes and we will review these next week. 02/01/2017 -- the operative remote of Dr. 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 02/08/2017 --he was seen by Dr. [name] on 02/03/2017 -- patient is on Marueno. after a thorough review he had recommended to continue with antibiotics during hemodialysis. The patient was seen by Dr. [name], but I do not find any follow-up note on the electronic medical record. he has removed the dressing over the right foot to remove the sutures and asked him to see him back in a month's time 02/22/2017 -- patient has been febrile and has been having symptoms of the upper respiratory tract infection but has not been checked for the flu and it's been over 4 days now. He says he is feeling better today. Some drainage between his left first and second toe and this needed to be looked at 03/08/2017 -- was seen by Dr. Bobby Rumpf on 03/07/2017 after review he will stop his antibiotics and see him back in a month to see if he is feeling well. 03/15/2017 -- he has completed his course of hyperbaric oxygen therapy and is doing fine with his health otherwise. 03/22/2017 -- his nutritionist at the dialysis center has recommended a protein supplement to help build his collagen and we will prescribe this for him  when he has the details 05/03/2017 -- he recently noticed the area on the plantar aspect of his fifth metatarsal head which opened out and had minimal drainage. 05/10/2017 -- -- x-ray of the left foot -- IMPRESSION:1. No convincing conventional radiographic evidence of active osteomyelitis.2. Active soft tissue ulceration at the tip of the fifth digit, and at the lateral aspect of the foot adjacent the base of  the fifth metatarsal. 3. Surgical changes of prior fourth toe amputation. 4. 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 5. Small vessel atherosclerotic vascular calcifications. 6. 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 ======= Readmission after 5 weeks: 06/15/2017 -- the patient returns after 5 weeks having had an MRI done on 05/17/2017 MRI of the left foot without contrast showed soft tissue ulcer overlying the base of the fifth metatarsal. 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 No drainable fluid collection to suggest an abscess. He was in hospital between 05/16/2017 and 05/25/2017 -- and on discharge was asked to follow-up with Dr. [name] and Dr. [name]. He was treated 2 weeks post discharge with vancomycin plus ceftriaxone with hemodialysis and oral metronidazole 500 mg 3 times a day. 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 last hemoglobin A1c was 6.7% He had a postoperative MR of the foot on 05/23/2017 -- which showed no new areas of cortical bone loss, edema or soft tissue ulceration to suggest osteomyelitis. the patient has completed his course of IV antibiotics during dialysis and is to see the infectious disease doctor tomorrow. Dr. [name] had seen him after suture removal and asked him to keep the wound with a dry dressing 06/21/2017 -- he was seen by Dr. Bobby Rumpf  of infectious disease on 06/16/2017 -- he stopped his Flagyl and will see him back in 6 weeks. He was asked to follow-up with Dr. [name] and with the wound clinic clinic. 07/05/17; bone biopsy from last time showed acute osteomyelitis. This is from the reminiscent in the left fifth metatarsal. Culture result is apparently still pending [holding for anaerobe}. From my understanding in this case this is been a progressive necrotic wound which is deteriorated markedly over the last 3 weeks since he returned here. He now has a large area of exposed bone which was biopsied and cultured last week. Dr. Graylon Good has put him on vancomycin and Fortaz during his hemodialysis and Flagyl orally. He is to see Dr. [name] next week 07/19/2017 -- the patient was reviewed by Dr. [name] of orthopedics who reviewed the case in detail and agreed with the plan to continue with IV antibiotics, aggressive wound care and hyperbaric oxygen therapy. He would see him back in 3 weeks' time 08/09/2017 -- saw Dr. Bobby Rumpf on 08/03/2017 -he was restarted on his antibiotics for 6 weeks which included vanco, ceftaz and Flagyl. he recommended continuing his antibiotics for 6 weeks and reevaluate his completion date. He would continue with wound care and hyperbaric oxygen therapy. 08/16/2017 -- I understand he will be completing his 6 weeks of antibiotics sometime later this week. 09/19/2017 -- he has been without his wound VAC for the last week due to lack of supplies. He has been packing his wound with silver alginate. 10/04/2017 -- he has an appointment with Dr. [name] tomorrow and hence we will not apply the wound VAC after his dressing changes today. 10/11/2017 -- he was seen by Dr. Bobby Rumpf on 10/05/2017, noted that the patient was currently off antibiotics, and after thorough review he recommended he follow-up with the wound care as per plan and no further antibiotics at this point. 11/29/17; patient is arrived for the  wound on his left lateral foot.Marland Kitchen He is completed IV antibiotics and 2 rounds of hyperbaric oxygen for treatment of underlying  osteomyelitis. He arrives today with a surface on most of the wound area however our intake nurse noted drainage from the superior aspect. This was brought to my attention. 12/13/2017 -- the right plantar foot had a large bleb and once the bleb was opened out a large callus and subcutaneous debris was removed and he has a plantar ulcer near the fourth metatarsal head 12/28/17 on evaluation today patient appears to be doing acutely worse in regard to his left foot. The wound which has been appearing to do better as now open up more deeply there is bone palpable at the base of the wound unfortunately. He tells me that this feels "like it did when he had osteomyelitis previously" he also noted that his second toe on the left foot appears to be doing worse and is swollen there does appear to be some fluid collected underneath. His right foot plantar ulcer appears to be doing somewhat better at this point and there really is no complication at the site currently. No fevers, chills, nausea, or vomiting noted at this time. Patient states that he normally has no pain at this site however. T oday he is having significant pain. 01/03/18; culture done last week showed methicillin sensitive staph aureus and group B strep. He was on Septra however he arrives today with a fever of 101. He has a functional dialysis shunt in his left arm but has had no pain here. No cough. He still makes some urine no dysuria he did have abdominal pain nausea and diarrhea over the weekend but is not had any diarrhea since yesterday. Otherwise he has no specific complaints 01/05/18; the patient returns today in follow-up for his presentation from 1/8. At that point he was febrile. I gave him some Levaquin adjusted for his dialysis status. He tells me the fever broke that night and it is not likely that this was  actually a wound infection. He is gone on to have an MRI of the left foot; this showed interval development of an abnormal signal from the base of the third and fourth metatarsal and in the cuboid reminiscent consistent with osteomyelitis. There is no mention of the left second toe I think which was a concern when it was ordered. The patient is taking his Levaquin 01/10/18; the patient has completed his antibiotics today. The area on the left lateral foot is smaller but it still probes easily to bone. He has underlying osteomyelitis here and sees Dr. Megan Salon of infectious disease this week ooThe area on the right third plantar metatarsal head still requires debridement not much change in dimensions [facility] has a new wound on the medial tip of his right third toe again this probes to bone. He only noticed this 2 days ago 01/17/18; the patient saw Dr. [name] last week and he is back on Vanco and Fortaz at dialysis. 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 We are previously seeing him for a wound also on the base of the fourth med head on the right. X-ray of this area did not show osteomyelitis in relation to a new wound on the tip of his right third toe. Again this probes to bone. Finally his second toe on the left which didn't really show anything on the MRI at least no report appears to have a separated cutaneous area which I think is going to come right  off the tip of his toe and leave exposed bone. Whether this is infectious or ischemic I am not clear Culture I did from the third toe last week which was his new wound was negative. Plain x-ray on the right foot did not show osteomyelitis in the toes 01/24/18; the patient is going went to see Dr. [name]. He is going to have a amputation of the left second and right third toe although paradoxically the left second toe looks better than last  week. We have treating a deep probing wound on the left lateral foot and the area over the third metatarsal head on the right foot. 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 Also a debridement including bone of the left lateral foot and a closure which is still sutured. This is a bit surprising. Also apparent debridement of the base of the right third toe wound. 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 I'm not sure exactly where we are in the frame of things here. The wound on the left lateral foot is still sutured also the amputation sites of the left second and right third toe. He is been using Xeroform to the sutured areas silver alginate to the right foot 02/14/18; he continues on Rocephin at dialysis for osteomyelitis. I still don't have a good sense of where we are in the treatment duration. The wounds on the left lateral foot had the sutures removed and this clearly still probes deeply. We debrided the area with a #5 curet. We'll use silver alginate to the wound oothe area over the right third met head also required debridement of callus skin and subcutaneous tissue and necrotic debris over the wound surface. This tunnels superiorly but I did not unroofed this today. ooThe areas on the tip of his toes have a crusted surface eschar I did not debridement this either. 02/21/18; he continues on Rocephin at dialysis for osteomyelitis. I don't have a good sense of where we are and the treatment duration. The wounds on the left lateral foot is callused over on presentation but requires debridement. The area on the right third med head plantar aspect actually is measuring smaller 02/28/18;patient is on Vanco and Fortaz at dialysis, I'm not sure why I thought this was  Rocephin above unless it is been changed. I don't have a good sense of time frame here. Using silver collagen to the area on the left lateral foot and right third med head plantar foot 03/07/18; patient is completing bank and Fortaz at dialysis soon. He is using Silver collagen to the area on the left lateral foot and the right third metatarsal head. He has a smaller superficial wound distal to the left lateral foot wound. 03/14/18-he is here in follow-up evaluation for multiple ulcerations to his bilateral feet. He presents with a new ulceration to the plantar aspect of the left foot underneath the blister, this was deroofed to reveal a partial thickness ulcer. He is voicing no complaints or concerns, tolerated dialysis yesterday. We will continue with same treatment plan and he will follow-up next week 03/21/18; the patient has a area on the lateral left foot which still has a small probing area. The overall surface area of the wound is better. He presented with a new ulceration  on the left foot plantar fifth metatarsal head last week. The area on the right third metatarsal head appears smaller.using silver alginate to all wound areas. The patient is changing the dressing himself. He is using a Darco forefoot offloading are on the right and a healing sandal on the left 03/28/18; original wound left lateral foot. Still nowhere close to looking like a heeling surface oosecondary wound on the left lateral foot at the base of his question fifth metatarsal which was new last week ooRight third metatarsal head. ooWe have been using silver alginate all wounds 04/04/18; the original wound on the left lateral foot again heavy callus surrounding thick nonviable subcutaneous tissue all requiring debridement. The new wound from 2 weeks ago just near this at the base of the fifth metatarsal head still looks about the same ooUnfortunately there is been deterioration in the third medical head wound on the right  which now probes to bone. I must say this was a superficial wound at one point in time and I really don't have a good frame of reference year. I'll have to go back to look through records about what we know about the right foot. 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 He is also been to see Dr. [name]. Somewhere in here as somebody is ordered a VASCULAR evaluation which was done on 03/22/18. On the right is anterior tibial artery was monophasic triphasic at the posterior tibial artery. On the left anterior tibial artery monophasic posterior tibial artery monophasic. ABI in the right was 1.43 on the left 1.21. TBIs on the right at 0.41 and on the left at 0.32. A vascular consult was recommended and I think has been arranged. 04/11/18; [name] has not had an MRI of the right foot since 2017. Recent x-ray of the right foot done in January and February was negative. However he has had a major deterioration in the wound over the third met head. He is completed antibiotics last week at dialysis [place]. I think he is going to see vascular in follow-up. The areas on the left lateral foot and left plantar fifth metatarsal head both look satisfactory. I debrided both of these areas although the tissue here looks good. The area on the left lateral foot once probe to bone it certainly does not do that now 04/18/18; MRI of the right foot is on Thursday. He has what looks to be serosanguineous purulent drainage coming out of the wound over the right third metatarsal head today. He completed antibiotics bank and Fortaz 2 weeks ago at dialysis. He has a vascular follow-up with regards to his arterial insufficiency although I don't exactly see when that is booked. The areas on the left foot including the lateral left foot and the plantar left fifth metatarsal head look about the same. 04/25/18-He is here in  follow-up evaluation for bilateral foot ulcers. MRI obtained was negative for osteomyelitis. Wound culture was negative. We will continue with same treatment plan he'll follow-up next week 05/02/18; the right plantar foot wound over the third metatarsal head actually looks better than when I last saw this. His MRI was negative for osteomyelitis wound culture was negative. ooOn the left plantar foot both wounds on the plantar fifth metatarsal head and on the lateral foot both are covered in a very hard circumferential callus. 05/09/18; right plantar foot wound over the third metatarsal head stable from last  week. ooLeft plantar foot wound over the fifth metatarsal head also stable but with callus around both wound areas ooThe area on the left lateral foot had thick callus over a surface and I had some thoughts about leaving this intact however it felt boggy. ooWe've been using silver alginate all wounds 05/16/18; since the patient was last here he was hospitalized from 5/15 through 5/19. 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 This grew MRSA. He was placed on vancomycin.MRI of the left foot suggested "progressive" osteomyelitis and bone destruction of the cuboid and the base of the fifth metatarsal. Stable erosive changes of the base of the second metatarsal. I'm wondering if they're aware that he had surgery and debridement in the area of the underlying bone previously by Dr. Luiz Blare. In any case, He was also revascularized with an angioplasty and stenting of the left tibial peroneal trunk and angioplasty of the left posterior tibial artery. He is on vancomycin at dialysis. He has a 2 week follow-up with infectious disease. He has vascular surgery follow-up. He was started on Plavix. 05/23/18; the patient's wound on the lateral left foot at the level of the fifth metatarsal head and the plantar  wound on the plantar fifth metatarsal head both look better. The area on the right third metatarsal head still has depth and undermining. We've been using silver alginate. He is on vancomycin. He has been revascularized on the left 05/30/18; he continues on vancomycin at dialysis. Revascularized on the left. The area on the left lateral foot is just about closed. Unfortunately the area over the plantar fifth metatarsal head undermining superiorly as does the area over the right third metatarsal head. Both of these significantly deteriorated from last week. He has appointments next week with infectious disease and orthopedics I delayed putting on a cast on the left until those appointments which therefore we made we'll bring him in on Friday the 14th with the idea of a cast on the left foot 06/06/18; he continues on vancomycin at dialysis. Revascularized on the left. He arrives today with a ballotable swelling just above the area on the left lateral foot where his previous wound was. He has no pain but he is reasonably insensate. The difficult areas on the plantar left fifth metatarsal head looks stable whereas the area on the right third plantar metatarsal head about the same as last week there is undermining here although I did not unroofed this today. He required a considerable debridement of the swelling on the left lateral foot area and I unroofed an abscess with a copious amount of brown purulent material which I obtained for culture. I don't believe during his recent hospitalization he had any further imaging although I need to review this. Previous cultures from this area done in this clinic showed MRSA, I would be surprised if this is not what this is currently even though he is on vancomycin. 06/13/18; he continues on vancomycin at dialysis however he finishes this on Sunday and then graduates the doxycycline previously prescribed by Dr. Ninetta Lights. The abscess site on the lateral foot that I  unroofed last time grew moderate amounts of methicillin-resistant staph aureus that is both vancomycin and tetracycline sensitive so we should be okay from that regard. He arrives today in clinic with a connection between the abscess site and the area on the lateral foot. We've been using silver alginate  to all his wound areas. The right third metatarsal head wound is measuring smaller. Using silver alginate on both wound areas 06/20/18; transitioning to doxycycline prescribed by Dr. Ninetta Lights. The abscess site on the lateral foot that I unroofed 2 weeks ago grew MRSA. That area has largely closed down although the lateral part of his wound on the fifth metatarsal head still probes to bone. I suspect all of this was connected. ooOn the right third metatarsal head smaller looking wound but surrounded by nonviable tissue that once again requires debridement using silver alginate on both areas 06/27/18; on doxycycline 100 twice a day. The abscess on the lateral foot has closed down. Although he still has the wound on the left fifth metatarsal head that extends towards the lateral part of the foot. The most lateral part of this wound probes to bone ooRight third metatarsal head still a deep probing wound with undermining. Nevertheless I elected not to debridement this this week ooIf everything is copious static next week on the left I'm going to attempt a total contact cast 07/04/18; he is tolerating his doxycycline. The abscess on the left lateral foot is closed down although there is still a deep wound here that probes to bone. We will use silver collagen under the total contact cast Silver collagen to the deep wound over the right third metatarsal head is well 07/11/18; he is tolerating doxycycline. The left lateral fifth plantar metatarsal head still probes deeply but I could not probe any bone. We've been using silver collagen and this will be the second week under the total contact cast ooSilver  collagen to deep wound over the right third metatarsal head as well 07/18/18; he is still taking doxycycline. The left lateral fifth plantar metatarsal this week probes deeply with a large amount of exposed bone. Quite a deterioration. He required extensive debridement. Specimens of bone for pathology and CNS obtained ooAlso considerable debridement on the right third metatarsal head 07/25/18; bone for pathology last week from the lateral left foot showed osteomyelitis. CandS of the bone Enterobacter. And Klebsiella. He is now on ciprofloxacin and the doxycycline is stopped [Dr. Ninetta Lights of infectious disease] area He has an appointment with Dr. Ninetta Lights tomorrow I'm going to leave the total contact cast off for this week. May wish to try to reapply that next week or the week after depending on the wound bed looks. I'm not sure if there is an operative option here, previously is followed with Dr. Luiz Blare 08/01/18 on evaluation today patient presents for reevaluation. He has been seen by infectious disease, Dr. Ninetta Lights, and he has placed the patient back on Ceftaz currently to be given to him at dialysis three times a week. 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 Fortunately there does not appear to be the evidence of infection at this point in time. Overall the patient has been tolerating the dressing changes without complication. We are no longer due lives in the cast of left foot secondary to the infection obviously. 08/10/18 on evaluation today patient appears to be doing rather well all things considered in regard to his left plantar foot ulcer. He does have a significant callous on the lateral portion of his left foot he wonders if I can help clean that away to some degree today. Fortunately he is having no evidence of infection at this time which is good news. No fevers chills noted. With that being said  his right plantar foot  actually does have a significant callous buildup around the wound opening this seems to not be doing as well as I would like at this point. I do believe that he would benefit from sharp debridement at this site. Subsequently I think a total contact cast would be helpful for the right foot as well. 08/17/18; the patient arrived today with a total contact cast on the right foot. This actually looks quite good. He had gone to see Dr. Megan Salon of infectious disease about the osteomyelitis on the left foot. I think he is on IV Fortaz at dialysis although I am not exactly sure of the rationale for the Fortaz at the time of this dictation. He also arrived in today with a swelling on the lateral left foot this is the site of his original wounds in fact when I first saw this man when he was under Dr. Ardeen Garland care I think this was the site of where the wound was located. It was not particularly tender however using a small scalpel I opened it to [place] some moderate amount of purulent drainage. 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 Last MRI was in May that showed progressive osteomyelitis and bone destruction of the cuboid and base of the fifth metatarsal. Last week he had an abscess over this same area this was removed. Culture was negative. ooWe are continuing with a total contact cast to the area on the third metatarsal head on the right and making some good progress here. The patient asked me again about renal transplant. He is not on the list because of the open wounds. 08/31/18; apparently the patient had an interruption in the [place] but he is now back on this at dialysis. Apparently there was a misunderstanding and Dr. Crissie Figures orders. Due to have the MRI of the left foot tonight. ooThe area on the right foot continues to have callus thick skin and subcutaneous tissue around  the wound edge that requires constant debridement however the wound is smaller we are using a total contact cast in this area. Alginate all wounds 09/07/18; without much surprise the MRI of his left foot showed osteomyelitis in the reminiscent of his fourth metatarsal but also the third metatarsal. She arrives in clinic today with the right foot and a total contact cast. There was purulent drainage coming out of the wound which I have cultured. Marked deterioration here with undermining widely around the wound orifice. Using pickups and a scalpel I remove callus and subcutaneous tissue from a substantial new opening. ooIn a similar fashion the area on the left lateral foot that was blistered last week I have opened this and remove skin and subcutaneous tissue from this area to expose a obvious new wound. I think there is extension and communication between all of this on the left foot. ooThe patient is on Fortaz at dialysis. Culture done of the right foot. We are clearly not making progress here. ooWe have made him an appointment with Dr. [name] of orthopedics. I think an amputation of the left leg may be discussed. I don't think there is anything that can be done with foot salvage. 09/14/18; culture from the right foot last time showed a very resistant MRSA. This is resistant to doxycycline. I'm going to try to get linezolid at least for a week. He does not have a appointment  with Dr. [name] yet. 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 I'll send a message to Dr. [name] of infectious disease. I want the patient to see Dr. [name] to go over the pros and cons of an amputation which I think will be a BKA. The patient had a question about whether this is curative or not. I told him that I thought it would be although spread of staph aureus infection is not unheard of. MRI of the right foot was done at the end of April 2019 did not show  osteomyelitis. The patient last saw Dr. [name] on 9/3. I'll send Dr. [name] a message. I would really like him to weigh the pros and cons of a BKA on the left otherwise he'll probably need IV antibiotics and perhaps hyperbaric oxygen again. He has not had a good response to this in the past. His MRI earlier this month showed progressive damage in the remnants of the fourth and third metatarsal 09/21/2018; sees Dr. [name] of orthopedics next week to discuss a left BKA in response to the osteomyelitis in the left foot. Using silver alginate to all wound areas. He is completing the Zyvox I did put in for him after last culture showed MRSA 09/29/2018; patient saw Dr. [name] of orthopedics discussed the osteomyelitis in the left foot third and fourth metatarsals. He did not recommend urgent surgery but certainly stated the only surgical option would be a BKA. He is communicating with Dr. [name]. The problem here is the instability of the areas on his foot which constantly generate draining abscesses. I will communicate with Dr. [name] about this. He has an appointment on 10/23 10/05/2018; patient sees Dr. [name] on 10/23. I have sent him a secure message not ordered any additional antibiotics for now. Patient continues to have 2 open areas on the left plantar foot at the fifth metatarsal head and the lateral aspect of the foot. These have not changed all that much. The area on the right third met head still has thick callus and surrounding subcutaneous tissue we have been using silver alginate 10/12/2018; patient sees Dr. [name] or colleague on 10/23. 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 The area on the right plantar foot is just about closed. The superior wound on the left fifth metatarsal head is callused over but I am not sure if this is closed. The area below it is about the same. Considerable amount of callus on the lateral foot.  We have been using silver alginate to the wounds 10/19/2018; the patient saw Dr. [name] on 10/22. He wishes to try and continue to save the left foot. He has been given 6 weeks of oral ciprofloxacin. We continue to put a total contact cast on the right foot. The area over the fifth metatarsal head on the left is callused over/perhaps healed but I did not remove the callus to find out. He still has the wound on the left lateral midfoot requiring debridement. We have been using silver alginate to all wound areas 10/26/2018 ooOn the left foot our intake nurse noted some purulent drainage from the inferior wound [currently the only one that is not callused over] ooOn the right foot even though he is in total contact cast a considerable amount of thick black callus and surface eschar. On this side we have been using silver alginate under a total contact  cast. oohe remains on ciprofloxacin as prescribed by Dr. [name] 11/02/2018; culture from last week which is done of the probing area on the left midfoot wound showed MRSA "few". I am going to need to contact Dr. [name] which I will do today I think he is going to need IV antibiotics again. The area on the left foot which was callused on the side is clearly separating today all of this was removed a copious amounts of callus and necrotic subcutaneous tissue. ooThe area on the right plantar foot actually remained healthy looking with a healthy granulated base. ooWe are using silver alginate to all wound areas 11/09/2018; unfortunately neither 1 of the patient's wounds areas looks at all satisfactory. On the left he has considerable necrotic debris from the plantar wound laterally over the foot. I removed copious amounts of material including callus skin and subcutaneous tissue. On the right foot he arrives with undermining and frankly purulent drainage. Specimen obtained for culture and debridement of the callused skin and subcutaneous tissue from  around the circumference. Because of this I cannot put him back in a total contact cast but to be truthful we are really unfortunately not making a lot of progress I did put in a secure text message to Dr. [name] wondering about the MRSA on the left foot. He suggested vancomycin although this is not started. I was left wondering if he expected me to send this into dialysis. Has we have more purulence on the right side wait for that result before calling dialysis 11/16/2018; I called dialysis earlier this week to get the vancomycin started. I think he got the first dose on Tuesday. The patient tells me that he fell earlier this week twisting his left foot and ankle and he is swelling. He continues to not look well. He had blood cultures done at dialysis on Tuesday he is not been informed of the results. 12/07/2018; the patient was admitted to hospital from 11/22/2018 through 12/02/2018. He underwent a left BKA. Apparently had staph sepsis. In the meantime being off the right foot this is closed over. He follows up with Dr. 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 Subsequent to this he actually had multiple falls in fact he tells me to following which calls the wound to be his apparently. He has been seeing Dr. [name] and his physician assistant in the interim. They actually did want him to come back for reevaluation to see if there's anything we can do for me wound care perspective the help this area to heal more appropriately. The patient states that he does not have a tremendous amount of pain which is good news. No fevers, chills, nausea, or vomiting noted at this time. We have gotten approval from Dr. [name] office had [place] for [place] to treat the patient for his stop wound. This is due to the fact that the patient was in the 90 day postop global. 1/21; the patient does  not have an open wound on the right foot although he does have some pressure areas that will need to be padded when he is transferring. The dehisced surgical wound looks clean although there is some undermining of 1.5 cm superiorly. 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 This appears healthy but there is undermining. TheraSkin #1 2/11; TheraSkin #2 2/25; TheraSkin #3.  Wound is measuring smaller 3/10; TheraSkin #4 wound is measuring smaller 3/24; TheraSkin #5 wound is measuring smaller and looks healthy. 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 He has no open area on the right foot although he does have some callused area over the original third metatarsal head wound. The third metatarsal is also subluxed on the right foot. I have warned him today that he cannot consider wearing a prosthesis for at least a month but he can go for measurements. He is going to need to keep the right foot padded is much as possible in his diabetic shoe indefinitely READMISSION 06/12/2019 [name] is a type II diabetic on dialysis. He has been in this clinic multiple times with wounds on his bilateral lower extremities. 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 He also has a history of wounds on his right foot but tells me that everything is going well here. He actually came in here with his prosthesis walking with a cane. We were all really quite gratified to see this He states over the last several weeks he has had a painful area at the tip of the left third finger. His dialysis shunt in his is in the left upper arm and this particularly hurts during dialysis when his fingers get numb and there is a lot of pain in the tip of his left third finger. I believe he is seen vascular surgery. He had a Doppler done to  evaluate for a dialysis steal syndrome. He is going for a banding procedure 1 week from today towards the left AV fistula. I think if that is unsuccessful they will be looking at creating a new shunt. He had a course of doxycycline when he which he finished about a week ago. He has been using Bactroban to the finger 6/25; the patient had his banding procedure and things seem to be going better. He is not having pain in his hand at dialysis. The area on the tip of his finger seems about closed. He did however have a paronychia I the last time I saw him. I have been advising him to use topical Bactroban washing the finger. Culture I did last time showed a few staph epidermidis which I was willing to dismiss as superficial skin contaminant. He arrives today with the finger wound looking better however the paronychia a seems worse 7/9; 2-week follow-up. He does not have an open wound on the tip of his third left finger. I thought he had an initial ischemic wound on the tip of the finger as well as a paronychia a medially. All of this appears to be closed. 8/6-Patient returns after 3 weeks for follow-up and has a right second met head plantar callus with a significant area of maceration hiding an ulcer ABI repeated today on right - 1.26 8/13-Patient returns at 1 week, the right second met head plantar wound appears to be slightly better, it is surrounded by the callus area we are using silver alginate 8/20; the patient came back to clinic 2 weeks ago with a wound on the right second metatarsal head. He apparently told me he developed callus in this area but also went away from his custom shoes to a pair of running shoes. Using silver alginate. 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  There might be much we can do to offload this although the patient tells me he has a wheelchair that he is using to try and stay off the wound is much as possible 8/27; right second metatarsal head.  Still small punched out area with thick callus and subcutaneous tissue around the wound. We have been using silver collagen. This is always been an issue with this man's wound especially on this foot 9/3; right second met head. Still the same punched-out area with thick callus and subcutaneous tissue around the wound removing the circumference demonstrates repetitively undermining area. I have removed all the subcutaneous tissue associated with this. We have been using silver collagen 9/15; right second metatarsal head. Same punched-out thick callus and subcutaneous tissue around the wound area. Still requiring debridement we have been using silver collagen I changed back to silver alginate X-ray last time showed no evidence of osteomyelitis. Culture showed Klebsiella and he was started and Keflex apparently just 4 days ago 9/24; right second metatarsal head. He is completed the antibiotics I gave him for 10 days. Same punched-out thick callus and subcutaneous tissue around the area. Still requiring debridement. We have been using silver alginate. The area itself looks swollen and this could be just [place] that comes on this area with walking on a prosthesis on the left however I think he needs an MRI and I am going to order that today. There is not an option to offload this further 10/1; right second plantar metatarsal head. MRI booked for October 6. Generally looking better today using silver alginate 10/8; right second plantar metatarsal head. MRI that was done on 10/6 was negative for osteomyelitis noted prior amputations of the second and fourth toes at the [facility]. Prior amputation of the third toe to the base of the middle phalanx. Prior amputation of the fifth toe to the head of the metatarsal. 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 He does not recall about hearing about this. He did not have any pain but then again he has reduced sensation from  diabetic neuropathy 10/15; right second plantar metatarsal head. Still in the same condition callus nonviable subcutaneous tissue which cleans up nicely with debridement but reforms by the next week. The patient tells me that he is offloading this is much as he can including taking his wheelchair to dialysis. We have been using silver alginate, changed to silver collagen today 10/22; right second plantar metatarsal head. Wound measures smaller but still thick callus around this wound. Still requiring debridement 11/5 right second plantar metatarsal head. Less callus around the wound circumference but still requiring debridement. There is still some undermining which I cleaned out the wound looks healthy but still considerable punched out depth with a relatively small circumference to the wound there is no palpable bone no purulent drainage 11/12; right second plantar metatarsal head. Small wound with thick callus tissue around the wound and significant undermining. We have been using silver collagen after my continuous debridements of this area 11/19; patient arrives in today with purulent drainage coming out of the wound in the second third met head area on the right foot. Serosanguineous thick drainage. Specimen obtained for culture. 11/21/2019 on evaluation today patient appears to be doing well with regard to his foot ulcer. The good news is is culture came back negative for any bacteria there was no growth. The other good news is his x-ray also appears to be doing well. The foot  is also measuring much better than what it was. Overall I am very pleased with how things seem to be progressing. 12/22; patient was in [facility] for several weeks due to the death of a family member. He said he stayed off the wound using silver alginate. 01/03/2020. Patient has a small open area with roughly 0.3 cm of circumferential undermining. The orifice looks smaller. We have been using silver  collagen. There is not a wound more aggressive way to offload this area as he has a prosthesis on the other leg 1/21; again the same small area is 2 weeks ago. We have been using silver collagen I change that to endoform today I really believe that this is an offloading issue 2/4; the area on the right third met head. We have been using endoform. 2/11 the area on the right third met head we have been using silver alginate. 2/25; the area on the right third met head. There is much less callus on this today. The patient states he is offloading this even more aggressively. As an example he is taking his wheelchair into dialysis on most days 3/4; right third metatarsal head. Again he has eschar over the surface of the wound which I removed with a #3 curette. Some subcutaneous debris. This has 0.8 cm of direct probing depth. I cannot feel any bone here. I did do a culture the area 3/11; right third metatarsal head. Again an eschared area over the wound which almost makes this look superficial. Again debridement reveals a wound with probing depth probably not much different from last week there is no palpable bone no palpable purulence. The patient tells me that he is making every effort to offload this area properly. He is taking wheelchair into dialysis etc. There is no option for forefoot offloading or a total contact cast. We used Oasis #1 today 3/18; again callus and eschar over the wound surface. I remove this but still a probing wound although only 3 mm in depth this time. This is an improvement 3/25; again callus and eschar over the wound surface which I removed the wound is much deeper today at 0.7 cm. There is no palpable bone. Because of the appearance of this a culture was done. I am not going to put the Oasis back in this. Concerned about underlying infection. Notable for the fact that he is just finished doxycycline for the last go round of this 3/30; still 0.7 cm in depth which is  disappointing. Culture I gave last time showed a few Staph aureus which is methicillin susceptible. This should have been taken care of by the recent course of doxycycline I gave him. We are using silver alginate 4/6; almost 1 cm in depth. We have been using silver alginate with underlying Bactroban. 4/15; about 1 cm in depth still. There is no palpable bone but there is undermining thick surface again as usual. Our intake nurse noted what looked to be purulent drainage I suppose that could have been Bactroban but I did a culture for CandS 4/20; depth down 0.7 cm. Culture from last time was negative we have been using Bactroban and silver alginate 4/29; depth down 0.5 cm. Using silver alginate. He reports some drainage 5/6; comes in with the wound worse. Wound is deeper and tunnels. Measures 0.9 depth and an undermining area of 1.8 cm. 5/18; there is a change the dressing last week. Still using Sorbact hydrogel. What I can see of the base of the wound looks better.  6/1. Not much change. I changed him to endoform today. He has 1.1 cm in depth no palpable bone 6/8; again a small punched out wound with significant undermining. Culture I did last week was negative. T oday he had some purulent drainage perhaps mixed with some retained endoform. 6/22; MRI report below ooIMPRESSION: Skin ulceration on the plantar surface of the foot at the level of the head of the second metatarsal. Mild marrow edema in the head of the second metatarsal could be reactive or due to osteomyelitis. Thin fluid collection surrounding the head and neck of the second metatarsal appears to communicate with the patient's skin wound and is worrisome for a septic collection. Advanced first MTP osteoarthritis. Prior amputations as noted above. Electronically Signed By: Inge Rise M.D. On: 06/16/2020 11:51 PCR culture I did last time I believe showed peptostreptococcus. There was no comment on any other gram-negative or  gram-positive organisms. I gave him a course of Flagyl which I think he is finishing up. We are not making any progress with the wound over the right second/third metatarsal head. We are using silver alginate putting him in kerlix Coban 6/29; has his appointment with Dr. [name] of orthopedics next Tuesday I think we see him on the same day. He was in for a nurse visit today. Nurse noted undermining of skin and callus so I was asked to look at this. We have been using silver alginate and kerlix Coban Objective Constitutional Sitting or standing Blood Pressure is within target range for patient.. Pulse regular and within target range for patient.Marland Kitchen Respirations regular, non-labored and within target range.. Temperature is normal and within the target range for the patient.Marland Kitchen Appears in no distress. Vitals Time Taken: 11:44 AM, Height: 73 in, Source: Stated, Weight: 202 lbs, Source: Stated, BMI: 26.6, Temperature: 98.7 F, Pulse: 85 bpm, Respiratory Rate: 18 breaths/min, Blood Pressure: 108/66 mmHg, Capillary Blood Glucose: 127 mg/dl. General Notes: glucose per pt report this am General Notes: Exam; again a small punched out area. Callus and underlying skin probing superiorly. I used pickups and a #10 blade to remove all of this also some subcutaneous tissue. I am able to get this wound to clean up quite nicely. No palpable bone but again I cannot get this to close Integumentary (Hair, Skin) Wound #41 status is Open. Original cause of wound was Gradually Appeared. The wound is located on the Right Metatarsal head second. The wound measures 0.5cm length x 0.4cm width x 0.6cm depth; 0.157cm^2 area and 0.094cm^3 volume. There is Fat Layer (Subcutaneous Tissue) Exposed exposed. There is no tunneling noted, however, there is undermining starting at 6:00 and ending at 9:00 with a maximum distance of 0.4cm. There is a medium amount of serous drainage noted. The wound margin is thickened. There is large  (67-100%) red granulation within the wound bed. There is no necrotic tissue within the wound bed. 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 There was a Excisional Skin/Subcutaneous Tissue Debridement with a total area of 1 sq cm performed by Ricard Dillon., MD. With the following instrument(s): Blade, and Forceps to remove Viable and Non-Viable tissue/material. Material removed  includes Callus, Subcutaneous Tissue, Skin: Dermis, and Skin: Epidermis after achieving pain control using Lidocaine 5% topical ointment. No specimens were taken. A time out was conducted at 12:26, prior to the start of the procedure. A Minimum amount of bleeding was controlled with Pressure. The procedure was tolerated well with a pain level of 0 throughout and a pain level of 0 following the procedure. Post Debridement Measurements: 0.5cm length x 0.4cm width x 0.6cm depth; 0.094cm^3 volume. Character of Wound/Ulcer Post Debridement is improved. Severity of Tissue Post Debridement is: Fat layer exposed. Post procedure Diagnosis Wound #41: Same as Pre-Procedure Pre-procedure diagnosis of Wound #41 is a Diabetic Wound/Ulcer of the Lower Extremity located on the Right Metatarsal head second . There was a Double Layer Compression Therapy Procedure by Carlene Coria, RN. Post procedure Diagnosis Wound #41: Same as Pre-Procedure Plan Follow-up Appointments: Return Appointment in 2 weeks. Nurse Visit: - 1 week Dressing Change Frequency: Wound #41 Right Metatarsal head second: Do not change entire  dressing for one week. Skin Barriers/Peri-Wound Care: Moisturizing lotion - right leg. Wound Cleansing: May shower with protection. - patient to use a cast protector. Primary Wound Dressing: Wound #41 Right Metatarsal head second: Calcium Alginate with Silver Secondary Dressing: Wound #41 Right Metatarsal head second: Dry Gauze Other: - felt to offload periwound. Edema Control: Kerlix and Coban - Right Lower Extremity Avoid standing for long periods of time Elevate legs to the level of the heart or above for 30 minutes daily and/or when sitting, a frequency of: - throughout the day. Additional Orders / Instructions: Other: - minimize walking and standing on foot to aid in wound healing and callus buildup. 1. I am looking for Dr. [name] review of the MRI. 2. Silver alginate post debridement today. 3. A lot of the chronicity of this wound is inability to offload this area Electronic Signature(s) Signed: 06/25/2020 3:41:00 PM By: Linton Ham MD Entered By: Linton Ham on 06/24/2020 12:54:13 -------------------------------------------------------------------------------- SuperBill Details Patient Name: Date of Service: Kateri Plummer 06/24/2020 Medical Record Number: [id number] Patient Account Number: [id number] Date of Birth/Sex: Treating RN: [birth year] (68 y.o. [name]) Carlene Coria Primary Care Provider: [name] Other Clinician: Referring Provider: Treating Provider/Extender: Tanna Savoy in Treatment: 54 Diagnosis Coding ICD-10 Codes Code Description E11.621 Type 2 diabetes mellitus with foot ulcer L97.512 Non-pressure chronic ulcer of other part of right foot with fat layer exposed I70.298 Other atherosclerosis of native arteries of extremities, other extremity L03.115 Cellulitis of right lower limb Facility Procedures The patient participates with Medicare or their insurance follows the Medicare Facility Guidelines: CPT4 Code Description  Modifier Quantity [id number] 11042 - DEB SUBQ TISSUE 20 SQ CM/< 1 ICD-10 Diagnosis Description L97.512 Non-pressure chronic ulcer of  other part of right foot with fat layer exposed Physician Procedures : CPT4 Code Description Modifier [id number] - WC PHYS SUBQ TISS 20 SQ CM ICD-10 Diagnosis Description L97.512 Non-pressure chronic ulcer of other part of right foot with fat layer exposed Quantity: 1 Electronic Signature(s) Signed: 06/25/2020 3:41:00 PM By: Linton Ham MD Entered By: Linton Ham on 06/24/2020 12:54:43

## 2020-07-01 ENCOUNTER — Encounter (HOSPITAL_BASED_OUTPATIENT_CLINIC_OR_DEPARTMENT_OTHER): Payer: Medicare Other | Attending: Internal Medicine | Admitting: Internal Medicine

## 2020-07-01 DIAGNOSIS — E1140 Type 2 diabetes mellitus with diabetic neuropathy, unspecified: Secondary | ICD-10-CM | POA: Insufficient documentation

## 2020-07-01 DIAGNOSIS — E1151 Type 2 diabetes mellitus with diabetic peripheral angiopathy without gangrene: Secondary | ICD-10-CM | POA: Insufficient documentation

## 2020-07-01 DIAGNOSIS — I509 Heart failure, unspecified: Secondary | ICD-10-CM | POA: Insufficient documentation

## 2020-07-01 DIAGNOSIS — Z89512 Acquired absence of left leg below knee: Secondary | ICD-10-CM | POA: Insufficient documentation

## 2020-07-01 DIAGNOSIS — L03115 Cellulitis of right lower limb: Secondary | ICD-10-CM | POA: Insufficient documentation

## 2020-07-01 DIAGNOSIS — Z7902 Long term (current) use of antithrombotics/antiplatelets: Secondary | ICD-10-CM | POA: Insufficient documentation

## 2020-07-01 DIAGNOSIS — I70208 Unspecified atherosclerosis of native arteries of extremities, other extremity: Secondary | ICD-10-CM | POA: Insufficient documentation

## 2020-07-01 DIAGNOSIS — N189 Chronic kidney disease, unspecified: Secondary | ICD-10-CM | POA: Insufficient documentation

## 2020-07-01 DIAGNOSIS — E11621 Type 2 diabetes mellitus with foot ulcer: Secondary | ICD-10-CM | POA: Insufficient documentation

## 2020-07-01 DIAGNOSIS — I132 Hypertensive heart and chronic kidney disease with heart failure and with stage 5 chronic kidney disease, or end stage renal disease: Secondary | ICD-10-CM | POA: Insufficient documentation

## 2020-07-01 DIAGNOSIS — Z880 Allergy status to penicillin: Secondary | ICD-10-CM | POA: Insufficient documentation

## 2020-07-01 DIAGNOSIS — Z992 Dependence on renal dialysis: Secondary | ICD-10-CM | POA: Insufficient documentation

## 2020-07-01 DIAGNOSIS — L97512 Non-pressure chronic ulcer of other part of right foot with fat layer exposed: Secondary | ICD-10-CM | POA: Insufficient documentation

## 2020-07-01 DIAGNOSIS — E1122 Type 2 diabetes mellitus with diabetic chronic kidney disease: Secondary | ICD-10-CM | POA: Insufficient documentation

## 2020-07-01 NOTE — Progress Notes (Signed)
[name] ([id number]) Visit Report for 07/01/2020 Arrival Information Details Patient Name: Date of Service: [name], WERTS 07/01/2020 2:30 PM Medical Record Number: [id number] Patient Account Number: [id number] Date of Birth/Sex: Treating RN: October 22, 1951 (69 y.o. [name], Vaughan Basta Primary Care Joh Rao: [name] Other Clinician: Referring Bina Veenstra: Treating Lydell Moga/Extender: Tanna Savoy in Treatment: 87 Visit Information History Since Last Visit Added or deleted any medications: No Patient Arrived: Wheel Chair Any new allergies or adverse reactions: No Arrival Time: 14:59 Had a fall or experienced change in No Accompanied By: self activities of daily living that may affect Transfer Assistance: None risk of falls: Patient Identification Verified: Yes Signs or symptoms of abuse/neglect since last visito No Secondary Verification Process Completed: Yes Hospitalized since last visit: No Patient Requires Transmission-Based Precautions: No Implantable device outside of the clinic excluding No Patient Has Alerts: No cellular tissue based products placed in the center since last visit: Has Dressing in Place as Prescribed: Yes Has Compression in Place as Prescribed: Yes Pain Present Now: No Electronic Signature(s) Signed: 07/01/2020 5:06:40 PM By: Baruch Gouty RN, BSN Entered By: Baruch Gouty on 07/01/2020 15:02:46 -------------------------------------------------------------------------------- Encounter Discharge Information Details Patient Name: Date of Service: [name] Plummer. 07/01/2020 2:30 PM Medical Record Number: [id number] Patient Account Number: [id number] Date of Birth/Sex: Treating RN: 04/22/1951 (69 y.o. [name] Primary Care Tammi Boulier: [name] Other Clinician: Referring Shonta Phillis: Treating Carmelo Reidel/Extender: Tanna Savoy in Treatment: 63 Encounter Discharge Information  Items Discharge Condition: Stable Ambulatory Status: Wheelchair Discharge Destination: Home Transportation: Private Auto Accompanied By: self Schedule Follow-up Appointment: Yes Clinical Summary of Care: Patient Declined Electronic Signature(s) Signed: 07/01/2020 5:18:40 PM By: Kela Millin Entered By: Kela Millin on 07/01/2020 16:39:30 -------------------------------------------------------------------------------- Lower Extremity Assessment Details Patient Name: Date of Service: [name], IDDINGS 07/01/2020 2:30 PM Medical Record Number: [id number] Patient Account Number: [id number] Date of Birth/Sex: Treating RN: Dec 09, 1951 (69 y.o. [name] Primary Care Jerrelle Michelsen: [name] Other Clinician: Referring Jendaya Gossett: Treating Quyen Cutsforth/Extender: Tanna Savoy in Treatment: 55 Edema Assessment Assessed: [name]: No] Patrice Paradise: No] Edema: [Left: N] [Right: o] Calf Left: Right: Point of Measurement: cm From Medial Instep cm 35.5 cm Ankle Left: Right: Point of Measurement: cm From Medial Instep cm 20.5 cm Vascular Assessment Pulses: Dorsalis Pedis Palpable: [Right:Yes] Electronic Signature(s) Signed: 07/01/2020 5:06:40 PM By: Baruch Gouty RN, BSN Entered By: Baruch Gouty on 07/01/2020 15:10:41 -------------------------------------------------------------------------------- Multi Wound Chart Details Patient Name: Date of Service: [name] Plummer. 07/01/2020 2:30 PM Medical Record Number: [id number] Patient Account Number: [id number] Date of Birth/Sex: Treating RN: 02/03/1951 (69 y.o. [name] Primary Care Axil Copeman: [name] Other Clinician: Referring Dorethea Strubel: Treating Telford Archambeau/Extender: Tanna Savoy in Treatment: 55 Vital Signs Height(in): 73 Capillary Blood Glucose(mg/dl): 123 Weight(lbs): 202 Pulse(bpm): 18 Body Mass Index(BMI): 27 Blood Pressure(mmHg): 99/66 Temperature(F):  98.9 Respiratory Rate(breaths/min): 18 Photos: [41:No Photos Right Metatarsal head second] [N/A:N/A N/A] Wound Location: [41:Gradually Appeared] [N/A:N/A] Wounding Event: [41:Diabetic Wound/Ulcer of the Lower] [N/A:N/A] Primary Etiology: [41:Extremity Cataracts, Anemia, Arrhythmia,] [N/A:N/A] Comorbid History: [41:Congestive Heart Failure, Hypertension, Peripheral Arterial Disease, Type II Diabetes, End Stage Renal Disease, History of Burn, Osteoarthritis, Osteomyelitis, Neuropathy 07/23/2019] [N/A:N/A] Date Acquired: [41:47] [N/A:N/A] Weeks of Treatment: [41:Open] [N/A:N/A] Wound Status: [41:0.5x0.5x0.6] [N/A:N/A] Measurements L x W x D (cm) [41:0.196] [N/A:N/A] A (cm) : rea [41:0.118] [N/A:N/A] Volume (cm) : [41:88.90%] [N/A:N/A] % Reduction in A rea: [41:77.70%] [N/A:N/A] % Reduction in Volume: [41:9] Starting Position 1 (o'clock): [41:12]  Ending Position 1 (o'clock): [41:0.4] Maximum Distance 1 (cm): [41:Yes] [N/A:N/A] Undermining: [41:Grade 2] [N/A:N/A] Classification: [41:Small] [N/A:N/A] Exudate A mount: [41:Serosanguineous] [N/A:N/A] Exudate Type: [41:red, brown] [N/A:N/A] Exudate Color: [41:Thickened] [N/A:N/A] Wound Margin: [41:Medium (34-66%)] [N/A:N/A] Granulation A mount: [41:Red] [N/A:N/A] Granulation Quality: [41:Medium (34-66%)] [N/A:N/A] Necrotic A mount: [41:Fat Layer (Subcutaneous Tissue)] [N/A:N/A] Exposed Structures: [41:Exposed: Yes Fascia: No Tendon: No Muscle: No Joint: No Bone: No None] [N/A:N/A] Treatment Notes Electronic Signature(s) Signed: 07/01/2020 5:09:59 PM By: Linton Ham MD Signed: 07/01/2020 5:26:11 PM By: Levan Hurst RN, BSN Entered By: Linton Ham on 07/01/2020 15:47:10 -------------------------------------------------------------------------------- Multi-Disciplinary Care Plan Details Patient Name: Date of Service: [name], [name] 07/01/2020 2:30 PM Medical Record Number: [id number] Patient Account Number: [id number] Date of  Birth/Sex: Treating RN: 1951-11-27 (69 y.o. [name] Primary Care Hollin Crewe: [name] Other Clinician: Referring Wiliam Cauthorn: Treating Aleksandar Duve/Extender: Tanna Savoy in Treatment: 55 Active Inactive Wound/Skin Impairment Nursing Diagnoses: Knowledge deficit related to ulceration/compromised skin integrity Goals: Patient/caregiver will verbalize understanding of skin care regimen Date Initiated: 06/12/2019 Target Resolution Date: 07/23/2020 Goal Status: Active Ulcer/skin breakdown will have a volume reduction of 30% by week 4 Date Initiated: 06/12/2019 Date Inactivated: 07/05/2019 Target Resolution Date: 07/13/2019 Goal Status: Met Interventions: Assess patient/caregiver ability to obtain necessary supplies Assess patient/caregiver ability to perform ulcer/skin care regimen upon admission and as needed Assess ulceration(s) every visit Notes: Electronic Signature(s) Signed: 07/01/2020 5:26:11 PM By: Levan Hurst RN, BSN Entered By: Levan Hurst on 07/01/2020 17:06:56 -------------------------------------------------------------------------------- Pain Assessment Details Patient Name: Date of Service: [name] Plummer. 07/01/2020 2:30 PM Medical Record Number: [id number] Patient Account Number: [id number] Date of Birth/Sex: Treating RN: 17-Sep-1951 (69 y.o. [name] Primary Care Zorana Brockwell: [name] Other Clinician: Referring Javien Tesch: Treating Arnet Hofferber/Extender: Tanna Savoy in Treatment: 55 Active Problems Location of Pain Severity and Description of Pain Patient Has Paino Yes Site Locations Pain Location: Pain in Ulcers With Dressing Change: No Duration of the Pain. Constant / Intermittento Intermittent Rate the pain. Current Pain Level: 0 Worst Pain Level: 5 Least Pain Level: 0 Character of Pain Describe the Pain: Sharp, Shooting Pain Management and Medication Current Pain Management: Other:  time Is the Current Pain Management Adequate: Adequate How does your wound impact your activities of daily livingo Sleep: No Bathing: No Appetite: No Relationship With Others: No Bladder Continence: No Emotions: No Bowel Continence: No Work: No Toileting: No Drive: No Dressing: No Hobbies: No Electronic Signature(s) Signed: 07/01/2020 5:06:40 PM By: Baruch Gouty RN, BSN Entered By: Baruch Gouty on 07/01/2020 15:07:23 -------------------------------------------------------------------------------- Patient/Caregiver Education Details Patient Name: Date of Service: [name] Plummer 7/6/2021andnbsp2:30 PM Medical Record Number: [id number] Patient Account Number: [id number] Date of Birth/Gender: Treating RN: 11/12/1951 (69 y.o. [name] Primary Care Physician: [name] Other Clinician: Referring Physician: Treating Physician/Extender: Tanna Savoy in Treatment: 75 Education Assessment Education Provided To: Patient Education Topics Provided Wound/Skin Impairment: Methods: Explain/Verbal Responses: State content correctly [employer]) Signed: 07/01/2020 5:26:11 PM By: Levan Hurst RN, BSN Entered By: Levan Hurst on 07/01/2020 17:07:10 -------------------------------------------------------------------------------- Wound Assessment Details Patient Name: Date of Service: [name] Plummer 07/01/2020 2:30 PM Medical Record Number: [id number] Patient Account Number: [id number] Date of Birth/Sex: Treating RN: 1951/04/10 (69 y.o. [name] Primary Care Masiah Lewing: [name] Other Clinician: Referring Joselito Fieldhouse: Treating Reiss Mowrey/Extender: Tanna Savoy in Treatment: 77 Wound Status Wound Number: 41 Primary Diabetic Wound/Ulcer of the Lower Extremity Etiology: Wound Location: Right Metatarsal head second  Wound Open Wounding Event: Gradually Appeared Status: Date Acquired:  07/23/2019 Comorbid Cataracts, Anemia, Arrhythmia, Congestive Heart Failure, Weeks Of Treatment: 47 History: Hypertension, Peripheral Arterial Disease, Type II Diabetes, End Clustered Wound: No Stage Renal Disease, History of Burn, Osteoarthritis, Osteomyelitis, Neuropathy Wound Measurements Length: (cm) 0.5 Width: (cm) 0.5 Depth: (cm) 0.6 Area: (cm) 0.196 Volume: (cm) 0.118 % Reduction in Area: 88.9% % Reduction in Volume: 77.7% Epithelialization: None Tunneling: No Undermining: Yes Starting Position (o'clock): 9 Ending Position (o'clock): 12 Maximum Distance: (cm) 0.4 Wound Description Classification: Grade 2 Wound Margin: Thickened Exudate Amount: Small Exudate Type: Serosanguineous Exudate Color: red, brown Foul Odor After Cleansing: No Slough/Fibrino No Wound Bed Granulation Amount: Medium (34-66%) Exposed Structure Granulation Quality: Red Fascia Exposed: No Necrotic Amount: Medium (34-66%) Fat Layer (Subcutaneous Tissue) Exposed: Yes Necrotic Quality: Adherent Slough Tendon Exposed: No Muscle Exposed: No Joint Exposed: No Bone Exposed: No Treatment Notes Wound #41 (Right Metatarsal head second) 1. Cleanse With Wound Cleanser Soap and water 2. Periwound Care Moisturizing lotion 3. Primary Dressing Applied Calcium Alginate Ag 4. Secondary Dressing Dry Gauze Foam 6. Support Layer Applied Kerlix/Coban Notes felt, foam for extra cushion under coban per patient. Electronic Signature(s) Signed: 07/01/2020 5:06:40 PM By: Baruch Gouty RN, BSN Entered By: Baruch Gouty on 07/01/2020 15:20:16 -------------------------------------------------------------------------------- Vitals Details Patient Name: Date of Service: [name] 07/01/2020 2:30 PM Medical Record Number: [id number] Patient Account Number: [id number] Date of Birth/Sex: Treating RN: 1951/04/01 (69 y.o. [name] Primary Care Kimberlye Dilger: [name] Other Clinician: Referring  Gabriana Wilmott: Treating Katalyna Socarras/Extender: Tanna Savoy in Treatment: 55 Vital Signs Time Taken: 15:03 Temperature (F): 98.9 Height (in): 73 Pulse (bpm): 90 Source: Stated Respiratory Rate (breaths/min): 18 Weight (lbs): 202 Blood Pressure (mmHg): 99/66 Source: Stated Capillary Blood Glucose (mg/dl): 123 Body Mass Index (BMI): 26.6 Reference Range: 80 - 120 mg / dl Electronic Signature(s) Signed: 07/01/2020 5:06:40 PM By: Baruch Gouty RN, BSN Entered By: Baruch Gouty on 07/01/2020 15:06:46

## 2020-07-01 NOTE — Progress Notes (Signed)
[name], [name] ([id number]) Visit Report for 07/01/2020 HPI Details Patient Name: Date of Service: [name], [name] 07/01/2020 2:30 PM Medical Record Number: [id number] Patient Account Number: [id number] Date of Birth/Sex: Treating RN: [birth year] (69 y.o. [name] Primary Care Provider: [name] Other Clinician: Referring Provider: Treating Provider/Extender: Tanna Savoy in Treatment: 55 History of Present Illness Location: Patient presents with a wound to bilateral feet. Quality: Patient reports experiencing essentially no pain. Severity: Mildly severe wound with no evidence of infection Duration: Patient has had the wound for greater than 2 weeks prior to presenting for treatment HPI Description: The patient is a pleasant 69 yrs old bm here for evaluation of ulcers on the plantar aspect of both feet. 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 He has a left arm graft for access. He has been trying to stay off his feet for weeks but does not seem to have any improvement in the wound. He has been seeing someone at the foot center and was referred to the Wound Care center for further evaluation. 12/30/15 the patient has 3 wounds one over the right first metatarsal head and 2 on the left foot at the left fifth and lleft first metatarsal head. All of these look relatively similar. The one over the left fifth probe to bone I could not prove that any of the others did. I note his MRI in November that did not show osteomyelitis. His peripheral pulses seem robust. All of these underwent surgical debridement to remove callus nonviable skin and subcutaneous tissue 01/06/16; the patient had his sutures removed from the right fifth ray amputation. There may be a small open part of this superiorly but otherwise the incision looks good. Areas over his right first, left first and left fifth metatarsal head all  underwent surgical debridement as a varying degree of callus, skin and nonviable subcutaneous tissue. The area that is most worrisome is the right fifth metatarsal head which has a wound probes precariously close to bone. There is no purulent drainage or erythema 01/13/16; I'm not exactly sure of the status of the right fifth ray amputation site however he follows with Dr. [name] later this week. The area over his right first plantar metatarsal head, left first and fifth plantar metatarsal head are all in the same status. Thick circumferential callus, nonviable subcutaneous tissue. Culture of the left fifth did not culture last week 01/20/16; all of the patient's wounds appear and roughly the same state although his amputation site on the right lateral foot looks better. His wounds over the right first, left first and left fifth metatarsal heads all underwent difficult surgical debridement removing circumferential callus nonviable skin and subcutaneous tissue. There is no overt evidence of infection in these areas. MRI at the end of December of the left foot did not show osteomyelitis, right foot showed osteomyelitis of the right fifth digit he is now status post amputation. 01/27/16 the patient's wounds over his plantar first and fifth metatarsal heads on the left all appear better having been started on a total contact cast last week. They were debridement of circumferential callus and nonviable subcutaneous tissue as was the wound over the first metatarsal head. His surgical incision on the right also had some light surface debridement done. 03/23/2016 -- the patient was doing really well and most of his wounds had almost completely healed but now he came back today with a history of having  a discharge from the area of his right foot on the plantar aspect and also between his first and second toe. He also has had some discharge from the left foot. Addendum: I spoke to the PA [name] at the  dialysis center whose fax number is [phone]. We discussed the infection the patient has and she will put the patient on vancomycin and Fortaz until the final culture report is back. We will fax this as soon as available. 03/30/2016 -- left foot x-ray IMPRESSION:No definitive osteomyelitis noted. X-ray of the right foot -- IMPRESSION: 1. Soft tissue swelling. Prior amputation right fifth digit. No acute or focal bony abnormality identified. If osteomyelitis remains a clinical concern, MRI can be obtained. 2. Peripheral vascular disease. His culture reports have grown an MSSA -- and we will fax this report to his hemodialysis center. He was called in a prescription of oral doxycycline but I have told her not to fill this in as he is already on IV antibiotics. 04/06/2016 -- a few days ago, I spoke to the hemodialysis center nurse who had stopped the IV antibiotics and he was given a prescription for doxycycline 100 mg by mouth twice a day for a week and he is on this at the present time. 05/11/2016 -- he has recently seen his PCP this week and his hemoglobin A1c was 7. He is working on his paperwork to get his orthotic shoes. 05/25/2016 -- -- x-ray of the left foot IMPRESSION:No acute bony abnormality. No radiographic changes of acute osteomyelitis. No change since prior study. X-ray of the right foot -- IMPRESSION: Postsurgical changes are seen involving the fifth toe. No evidence of acute osteomyelitis. he developed a large blister on the medial part of his right foot and this opened out and drain fluid. 06/01/2016 -- he has his MRI to be done this afternoon. 06/15/2016 -- MRI of the left forefoot without contrast shows 2 separate regions of cutaneous and subcutaneous edema and possible ulceration and blistering along the ball of the foot. No obvious osteomyelitis identified. MRI of the right foot showed cutaneous and subcutaneous thickening plantar to the first digit sesamoid with an ulcer  crater but no underlying osteomyelitis is identified. 06/22/2016 -- the right foot plantar ulcer has been draining a lot of seropurulent material for the last few days. 06/30/2016 -- spoke to the dialysis center and I believe I spoke to Pax a PA at the center who discussed with me and agreed to putting Legrand Como on vancomycin until his cultures arrive. On review of his culture report no WBCs were seen or no organisms were seen and the culture was reincubated for better growth. The final report is back and there were no predominant growth including Streptococcus or Staphylococcus. Clinically though he has a lot of drainage from both wounds a lot of undermining and there is further blebs on the left foot towards the interspace between his first and second toe. 08/10/2016 -- the culture from the right foot showed normal skin flora and there was no Staphylococcus aureus was group A streptococcus isolated. 09/21/2016 -- -- MRI of the right foot was done on 09/13/2016 - IMPRESSION: Findings most consistent with acute osteomyelitis throughout the great toe, sesamoid bones and plantar aspect of the head of the first metatarsal. Fluid in the sheath of the flexor tendon of the great toe could be sympathetic but is worrisome for septic tenosynovitis. First MTP joint effusion worrisome for septic joint. He was admitted to the hospital on 09/11/2016  and treated for a fever with vancomycin and cefepime. He was seen by Dr. Jeffrey Hatcher of infectious disease who recommended 6 weeks treatment with vancomycin and ceftazidime with his hemodialysis and to continue to see as in the wound clinic. Vascular consult was pending. The patient was discharged home on 09/14/2016 and he would continue with IV antibiotics for 6 weeks. 09/28/16 wound appears reasonably healthy. Continuing with total contact cast 10/05/16 wound is smaller and looking healthy. Continue with total contact cast. He continues on IV  antibiotics 10/12/2016 -- he has developed a new wound on the dorsal aspect of his left big toe and this is a superficial injury with no surrounding cellulitis. He has completed 30 days of IV antibiotics and is now ready to start his hyperbaric oxygen therapy as per his insurance company's recommendation. 10/19/2016 -- after the cast was removed on the right side he has got good resolution of his ulceration on the right plantar foot. he has a new wound on the left plantar foot in the region of his fourth metatarsal and this will need sharp debridement. 10/26/2016 -- Xray of the right foot complete: IMPRESSION: Changes consistent with osteomyelitis involving the head of the first metatarsal and base of the first proximal phalanx. The sesamoid bones are also likely involved given their positioning. 11/02/2016 -- there still awaiting insurance clearance for his hyperbaric oxygen therapy and hopefully he will begin treatment soon. 11/09/2016 -- started with hyperbaric oxygen therapy and had some barotrauma to the right ear and this was seen by ENT who was prescribed Afrin drops and would probably continue with HBO and he is scheduled for myringotomy tubes on Friday 11/16/2016 -- he had his myringotomy tubes placed on Friday and has been doing much better after that with some fluid draining out after hyperbaric treatment today. The pain was much better. 11/30/2016 -- over the last 2 days he noticed a swelling and change of color of his right second toe and this had been draining minimal fluid. 12/07/2016 -- x-ray of the right foot -- IMPRESSION:1. Progressive ulceration at the distal aspect of the second digit with significant soft tissue swelling and osseous changes in the distal phalanx compatible with osteomyelitis. 2. Chronic osteomyelitis at the first MTP joint. 3. Ulcerations at the second and third toes as well without definite osseous changes. Osteomyelitis is not excluded. 4. Fifth digit  amputation. On 12/01/2016 I spoke to Dr. Jeffrey Hatcher, the infectious disease specialist, who kindly agreed to treat this with IV antibiotics and he would call in the order to the dialysis center and this has been discussed in detail with the patient who will make the appropriate arrangements. The patient will also book an appointment as soon as possible to see Dr. Hatcher in the office. Of note the patient has not been on antibiotics this entire week as the dialysis center did not receive any orders from Dr. Hatcher. I got in touch with Dr. Hatcher who tells me the patient has an appointment to see him this coming Wednesday. 12/14/2016 -- the patient is on ceftazidime and vancomycin during his dialysis, and I understand this was put on by his nephrologist Dr. K possibly after speaking with infectious disease Dr. Hatcher 12/23/16; patient was on my schedule today for a wound evaluation as he had difficulties with his schedule earlier this week. I note that he is on vancomycin and ceftazidine at dialysis. In spite of this he arrives today with a new wound on the base of the   right second toe this easily probes to bone. The known wound at the tip of the second toe With this area. He also has a superficial area on the medial aspect of the third toe on the side of the DIP. This does not appear to have much depth. The area on the plantar left foot is a deep area but did not probe the bone 12/28/16 -- he has brought in some lab work and the most recent labs done showed a hemoglobin of 12.1 hematocrit of 36.3, neutrophils of 51% WBC count of 5.6, BN of 53, albumin of 4.1 globulin of 3.7, vancomycin 13 g per mL. 01/04/2017 -- he saw Dr. [name] who has recommended a amputation of the right second toe and he is awaiting this date. He sees Dr. Bobby Rumpf of infectious disease tomorrow. The bone culture taken on 12/28/2016 had no growth in 2 days. 01/11/2017 -- was seen by Dr. Bobby Rumpf regarding the  management and has recommended a eval by vascular surgeons. He recommended to continue the antibiotics during his hemodialysis. Xray of the left foot -- IMPRESSION: No acute fracture, dislocation, or osseous erosion identified. 01/18/2017 -- he has his vascular workup later today and he is going to have his right foot second toe amputation this coming Friday by Dr. [name]. We have also put in for a another 30 treatments with hyperbaric oxygen therapy 01/25/2017 -- I have reviewed Dr. Donnetta Hutching is vascular report from last week where he reviewed him and thought his vascular function was good enough to heal his amputation site and no further tests were recommended. He also had his orthopedic related to surgery which is still pending the notes and we will review these next week. 02/01/2017 -- the operative remote of Dr. 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 02/08/2017 --he was seen by Dr. [name] on 02/03/2017 -- patient is on Jane. after a thorough review he had recommended to continue with antibiotics during hemodialysis. The patient was seen by Dr. [name], but I do not find any follow-up note on the electronic medical record. he has removed the dressing over the right foot to remove the sutures and asked him to see him back in a month's time 02/22/2017 -- patient has been febrile and has been having symptoms of the upper respiratory tract infection but has not been checked for the flu and it's been over 4 days now. He says he is feeling better today. Some drainage between his left first and second toe and this needed to be looked  at 03/08/2017 -- was seen by Dr. Bobby Rumpf on 03/07/2017 after review he will stop his antibiotics and see him back in a month to see if he is feeling well. 03/15/2017 -- he has completed his course of hyperbaric oxygen therapy and is doing fine with his health otherwise. 03/22/2017 -- his nutritionist at the dialysis center has recommended a protein supplement to help build his collagen and we will prescribe this for him when he has the details 05/03/2017 -- he recently noticed the area on the plantar aspect of his fifth metatarsal head which opened out and had minimal drainage. 05/10/2017 -- -- x-ray of  the left foot -- IMPRESSION:1. No convincing conventional radiographic evidence of active osteomyelitis.2. Active soft tissue ulceration at the tip of the fifth digit, and at the lateral aspect of the foot adjacent the base of the fifth metatarsal. 3. Surgical changes of prior fourth toe amputation. 4. 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 5. Small vessel atherosclerotic vascular calcifications. 6. 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 ======= Readmission after 5 weeks: 06/15/2017 -- the patient returns after 5 weeks having had an MRI done on 05/17/2017 MRI of the left foot without contrast showed soft tissue ulcer overlying the base of the fifth metatarsal. 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 No drainable fluid collection to suggest an abscess. He was in hospital between 05/16/2017 and 05/25/2017 -- and on discharge was asked to follow-up with Dr. Graves and Dr. Hatcher. He was treated 2 weeks post discharge with vancomycin plus ceftriaxone with hemodialysis and oral metronidazole 500 mg 3 times a day. 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 last hemoglobin A1c was  6.7% He had a postoperative MR of the foot on 05/23/2017 -- which showed no new areas of cortical bone loss, edema or soft tissue ulceration to suggest osteomyelitis. the patient has completed his course of IV antibiotics during dialysis and is to see the infectious disease doctor tomorrow. Dr. Graves had seen him after suture removal and asked him to keep the wound with a dry dressing 06/21/2017 -- he was seen by Dr. Jeffrey Hatcher of infectious disease on 06/16/2017 -- he stopped his Flagyl and will see him back in 6 weeks. He was asked to follow-up with Dr. Graves and with the wound clinic clinic. 07/05/17; bone biopsy from last time showed acute osteomyelitis. This is from the reminiscent in the left fifth metatarsal. Culture result is apparently still pending [holding for anaerobe}. From my understanding in this case this is been a progressive necrotic wound which is deteriorated markedly over the last 3 weeks since he returned here. He now has a large area of exposed bone which was biopsied and cultured last week. Dr. Snyder has put him on vancomycin and Fortaz during his hemodialysis and Flagyl orally. He is to see Dr. Graves next week 07/19/2017 -- the patient was reviewed by Dr. Graves of orthopedics who reviewed the case in detail and agreed with the plan to continue with IV antibiotics, aggressive wound care and hyperbaric oxygen therapy. He would see him back in 3 weeks' time 08/09/2017 -- saw Dr. Jeffrey Hatcher on 08/03/2017 -he was restarted on his antibiotics for 6 weeks which included vanco, ceftaz and Flagyl. he recommended continuing his antibiotics for 6 weeks and reevaluate his completion date. He would continue with wound care and hyperbaric oxygen therapy. 08/16/2017 -- I understand he will be completing his 6 weeks of antibiotics sometime later this week. 09/19/2017 -- he has been without his wound VAC for the last week due to lack of supplies. He has been packing his wound  with silver alginate. 10/04/2017 -- he has an appointment with Dr. Hatcher tomorrow and hence we will not apply the wound VAC after his dressing changes today. 10/11/2017 -- he was seen by Dr. Jeffrey Hatcher on 10/05/2017, noted that the patient was currently off antibiotics, and after thorough review he recommended he follow-up with the wound care as   per plan and no further antibiotics at this point. 11/29/17; patient is arrived for the wound on his left lateral foot.. He is completed IV antibiotics and 2 rounds of hyperbaric oxygen for treatment of underlying osteomyelitis. He arrives today with a surface on most of the wound area however our intake nurse noted drainage from the superior aspect. This was brought to my attention. 12/13/2017 -- the right plantar foot had a large bleb and once the bleb was opened out a large callus and subcutaneous debris was removed and he has a plantar ulcer near the fourth metatarsal head 12/28/17 on evaluation today patient appears to be doing acutely worse in regard to his left foot. The wound which has been appearing to do better as now open up more deeply there is bone palpable at the base of the wound unfortunately. He tells me that this feels "like it did when he had osteomyelitis previously" he also noted that his second toe on the left foot appears to be doing worse and is swollen there does appear to be some fluid collected underneath. His right foot plantar ulcer appears to be doing somewhat better at this point and there really is no complication at the site currently. No fevers, chills, nausea, or vomiting noted at this time. Patient states that he normally has no pain at this site however. T oday he is having significant pain. 01/03/18; culture done last week showed methicillin sensitive staph aureus and group B strep. He was on Septra however he arrives today with a fever of 101. He has a functional dialysis shunt in his left arm but has had no pain here.  No cough. He still makes some urine no dysuria he did have abdominal pain nausea and diarrhea over the weekend but is not had any diarrhea since yesterday. Otherwise he has no specific complaints 01/05/18; the patient returns today in follow-up for his presentation from 1/8. At that point he was febrile. I gave him some Levaquin adjusted for his dialysis status. He tells me the fever broke that night and it is not likely that this was actually a wound infection. He is gone on to have an MRI of the left foot; this showed interval development of an abnormal signal from the base of the third and fourth metatarsal and in the cuboid reminiscent consistent with osteomyelitis. There is no mention of the left second toe I think which was a concern when it was ordered. The patient is taking his Levaquin 01/10/18; the patient has completed his antibiotics today. The area on the left lateral foot is smaller but it still probes easily to bone. He has underlying osteomyelitis here and sees Dr. Campbell of infectious disease this week The area on the right third plantar metatarsal head still requires debridement not much change in dimensions He has a new wound on the medial tip of his right third toe again this probes to bone. He only noticed this 2 days ago 01/17/18; the patient saw Dr. Hatcher last week and he is back on Vanco and Fortaz at dialysis. 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 We are previously seeing him for a wound also on the base of the fourth med head on the right. X-ray of this area did not show osteomyelitis in relation to a new wound on the tip of his right third toe. Again this probes   to bone. Finally his second toe on the left which didn't really show anything on the MRI at least no report appears to have a separated cutaneous area which I think is going to come right off the tip  of his toe and leave exposed bone. Whether this is infectious or ischemic I am not clear Culture I did from the third toe last week which was his new wound was negative. Plain x-ray on the right foot did not show osteomyelitis in the toes 01/24/18; the patient is going went to see Dr. Graves. He is going to have a amputation of the left second and right third toe although paradoxically the left second toe looks better than last week. We have treating a deep probing wound on the left lateral foot and the area over the third metatarsal head on the right foot. 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 Also a debridement including bone of the left lateral foot and a closure which is still sutured. This is a bit surprising. Also apparent debridement of the base of the right third toe wound. 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 I'm not sure exactly where we are in the frame of things here. The wound on the left lateral foot is still sutured also the amputation sites of the left second and right third toe. He is been using Xeroform to the sutured areas silver alginate to the right foot 02/14/18; he continues on Rocephin at dialysis for osteomyelitis. I still don't have a good sense of where we are in the treatment duration. The wounds on the left lateral foot had the sutures removed and this clearly still probes deeply. We debrided the area with a #5 curet. We'll use silver alginate to the wound the area over the right third met head also required debridement of callus skin and subcutaneous tissue and necrotic debris over the wound surface. This tunnels superiorly but I did not unroofed this today. The areas on the tip of his toes have a crusted surface eschar I did not  debridement this either. 02/21/18; he continues on Rocephin at dialysis for osteomyelitis. I don't have a good sense of where we are and the treatment duration. The wounds on the left lateral foot is callused over on presentation but requires debridement. The area on the right third med head plantar aspect actually is measuring smaller 02/28/18;patient is on Vanco and Fortaz at dialysis, I'm not sure why I thought this was Rocephin above unless it is been changed. I don't have a good sense of time frame here. Using silver collagen to the area on the left lateral foot and right third med head plantar foot 03/07/18; patient is completing bank and Fortaz at dialysis soon. He is using Silver collagen to the area on the left lateral foot and the right third metatarsal head. He has a smaller superficial wound distal to the left lateral foot wound. 03/14/18-he is here in follow-up evaluation for multiple ulcerations to his bilateral feet. He presents with a new ulceration to the plantar aspect of the left foot underneath the blister, this was deroofed to reveal a partial thickness ulcer. He is voicing no complaints or concerns, tolerated dialysis yesterday. We will continue with same treatment plan and he will   follow-up next week 03/21/18; the patient has a area on the lateral left foot which still has a small probing area. The overall surface area of the wound is better. He presented with a new ulceration on the left foot plantar fifth metatarsal head last week. The area on the right third metatarsal head appears smaller.using silver alginate to all wound areas. The patient is changing the dressing himself. He is using a Darco forefoot offloading are on the right and a healing sandal on the left 03/28/18; original wound left lateral foot. Still nowhere close to looking like a heeling surface secondary wound on the left lateral foot at the base of his question fifth metatarsal which was new last week Right third  metatarsal head. We have been using silver alginate all wounds 04/04/18; the original wound on the left lateral foot again heavy callus surrounding thick nonviable subcutaneous tissue all requiring debridement. The new wound from 2 weeks ago just near this at the base of the fifth metatarsal head still looks about the same Unfortunately there is been deterioration in the third medical head wound on the right which now probes to bone. I must say this was a superficial wound at one point in time and I really don't have a good frame of reference year. I'll have to go back to look through records about what we know about the right foot. 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 He is also been to see Dr. Graves. Somewhere in here as somebody is ordered a VASCULAR evaluation which was done on 03/22/18. On the right is anterior tibial artery was monophasic triphasic at the posterior tibial artery. On the left anterior tibial artery monophasic posterior tibial artery monophasic. ABI in the right was 1.43 on the left 1.21. TBIs on the right at 0.41 and on the left at 0.32. A vascular consult was recommended and I think has been arranged. 04/11/18; Mr. Tullo has not had an MRI of the right foot since 2017. Recent x-ray of the right foot done in January and February was negative. However he has had a major deterioration in the wound over the third met head. He is completed antibiotics last week at dialysis Vanco and Fortaz. I think he is going to see vascular in follow-up. The areas on the left lateral foot and left plantar fifth metatarsal head both look satisfactory. I debrided both of these areas although the tissue here looks good. The area on the left lateral foot once probe to bone it certainly does not do that now 04/18/18; MRI of the right foot is on Thursday. He has what looks to be serosanguineous purulent  drainage coming out of the wound over the right third metatarsal head today. He completed antibiotics bank and Fortaz 2 weeks ago at dialysis. He has a vascular follow-up with regards to his arterial insufficiency although I don't exactly see when that is booked. The areas on the left foot including the lateral left foot and the plantar left fifth metatarsal head look about the same. 04/25/18-He is here in follow-up evaluation for bilateral foot ulcers. MRI obtained was negative for osteomyelitis. Wound culture was negative. We will continue with same treatment plan he'll follow-up next week 05/02/18; the right plantar foot wound over the third metatarsal head actually looks better than when I last saw this. His MRI was negative for osteomyelitis wound culture was negative. On the left plantar   foot both wounds on the plantar fifth metatarsal head and on the lateral foot both are covered in a very hard circumferential callus. 05/09/18; right plantar foot wound over the third metatarsal head stable from last week. Left plantar foot wound over the fifth metatarsal head also stable but with callus around both wound areas The area on the left lateral foot had thick callus over a surface and I had some thoughts about leaving this intact however it felt boggy. We've been using silver alginate all wounds 05/16/18; since the patient was last here he was hospitalized from 5/15 through 5/19. 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 This grew MRSA. He was placed on vancomycin.MRI of the left foot suggested "progressive" osteomyelitis and bone destruction of the cuboid and the base of the fifth metatarsal. Stable erosive changes of the base of the second metatarsal. I'm wondering if they're aware that he had surgery and debridement in the area of the underlying bone previously by Dr. Graves. In any case, He was  also revascularized with an angioplasty and stenting of the left tibial peroneal trunk and angioplasty of the left posterior tibial artery. He is on vancomycin at dialysis. He has a 2 week follow-up with infectious disease. He has vascular surgery follow-up. He was started on Plavix. 05/23/18; the patient's wound on the lateral left foot at the level of the fifth metatarsal head and the plantar wound on the plantar fifth metatarsal head both look better. The area on the right third metatarsal head still has depth and undermining. We've been using silver alginate. He is on vancomycin. He has been revascularized on the left 05/30/18; he continues on vancomycin at dialysis. Revascularized on the left. The area on the left lateral foot is just about closed. Unfortunately the area over the plantar fifth metatarsal head undermining superiorly as does the area over the right third metatarsal head. Both of these significantly deteriorated from last week. He has appointments next week with infectious disease and orthopedics I delayed putting on a cast on the left until those appointments which therefore we made we'll bring him in on Friday the 14th with the idea of a cast on the left foot 06/06/18; he continues on vancomycin at dialysis. Revascularized on the left. He arrives today with a ballotable swelling just above the area on the left lateral foot where his previous wound was. He has no pain but he is reasonably insensate. The difficult areas on the plantar left fifth metatarsal head looks stable whereas the area on the right third plantar metatarsal head about the same as last week there is undermining here although I did not unroofed this today. He required a considerable debridement of the swelling on the left lateral foot area and I unroofed an abscess with a copious amount of brown purulent material which I obtained for culture. I don't believe during his recent hospitalization he had any further imaging  although I need to review this. Previous cultures from this area done in this clinic showed MRSA, I would be surprised if this is not what this is currently even though he is on vancomycin. 06/13/18; he continues on vancomycin at dialysis however he finishes this on Sunday and then graduates the doxycycline previously prescribed by Dr. Hatcher. The abscess site on the lateral foot that I unroofed last time grew moderate amounts of methicillin-resistant staph aureus that is both   vancomycin and tetracycline sensitive so we should be okay from that regard. He arrives today in clinic with a connection between the abscess site and the area on the lateral foot. We've been using silver alginate to all his wound areas. The right third metatarsal head wound is measuring smaller. Using silver alginate on both wound areas 06/20/18; transitioning to doxycycline prescribed by Dr. Hatcher. The abscess site on the lateral foot that I unroofed 2 weeks ago grew MRSA. That area has largely closed down although the lateral part of his wound on the fifth metatarsal head still probes to bone. I suspect all of this was connected. On the right third metatarsal head smaller looking wound but surrounded by nonviable tissue that once again requires debridement using silver alginate on both areas 06/27/18; on doxycycline 100 twice a day. The abscess on the lateral foot has closed down. Although he still has the wound on the left fifth metatarsal head that extends towards the lateral part of the foot. The most lateral part of this wound probes to bone Right third metatarsal head still a deep probing wound with undermining. Nevertheless I elected not to debridement this this week If everything is copious static next week on the left I'm going to attempt a total contact cast 07/04/18; he is tolerating his doxycycline. The abscess on the left lateral foot is closed down although there is still a deep wound here that probes to bone. We  will use silver collagen under the total contact cast Silver collagen to the deep wound over the right third metatarsal head is well 07/11/18; he is tolerating doxycycline. The left lateral fifth plantar metatarsal head still probes deeply but I could not probe any bone. We've been using silver collagen and this will be the second week under the total contact cast Silver collagen to deep wound over the right third metatarsal head as well 07/18/18; he is still taking doxycycline. The left lateral fifth plantar metatarsal this week probes deeply with a large amount of exposed bone. Quite a deterioration. He required extensive debridement. Specimens of bone for pathology and CNS obtained Also considerable debridement on the right third metatarsal head 07/25/18; bone for pathology last week from the lateral left foot showed osteomyelitis. CandS of the bone Enterobacter. And Klebsiella. He is now on ciprofloxacin and the doxycycline is stopped [Dr. Hatcher of infectious disease] area He has an appointment with Dr. Hatcher tomorrow I'm going to leave the total contact cast off for this week. May wish to try to reapply that next week or the week after depending on the wound bed looks. I'm not sure if there is an operative option here, previously is followed with Dr. Graves 08/01/18 on evaluation today patient presents for reevaluation. He has been seen by infectious disease, Dr. Hatcher, and he has placed the patient back on Ceftaz currently to be given to him at dialysis three times a week. 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 Fortunately there does not appear to be the evidence of infection at this point in time. Overall the patient has been tolerating the dressing changes without complication. We are no longer due lives in the cast of left foot secondary to the infection obviously. 08/10/18 on evaluation today patient appears to be  doing rather well all things considered in regard to his left plantar foot ulcer. He does have a significant callous on the lateral portion of his left   foot he wonders if I can help clean that away to some degree today. Fortunately he is having no evidence of infection at this time which is good news. No fevers chills noted. With that being said his right plantar foot actually does have a significant callous buildup around the wound opening this seems to not be doing as well as I would like at this point. I do believe that he would benefit from sharp debridement at this site. Subsequently I think a total contact cast would be helpful for the right foot as well. 08/17/18; the patient arrived today with a total contact cast on the right foot. This actually looks quite good. He had gone to see Dr. Megan Salon of infectious disease about the osteomyelitis on the left foot. I think he is on IV Fortaz at dialysis although I am not exactly sure of the rationale for the Fortaz at the time of this dictation. He also arrived in today with a swelling on the lateral left foot this is the site of his original wounds in fact when I first saw this man when he was under Dr. Ardeen Garland care I think this was the site of where the wound was located. It was not particularly tender however using a small scalpel I opened it to [place] some moderate amount of purulent drainage. 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 Last MRI was in May that showed progressive osteomyelitis and bone destruction of the cuboid and base of the fifth metatarsal. Last week he had an abscess over this same area this was removed. Culture was negative. We are continuing with a total contact cast to the area on the third metatarsal head on the right and making some good progress here. The patient asked me again about renal  transplant. He is not on the list because of the open wounds. 08/31/18; apparently the patient had an interruption in the [place] but he is now back on this at dialysis. Apparently there was a misunderstanding and Dr. Crissie Figures orders. Due to have the MRI of the left foot tonight. The area on the right foot continues to have callus thick skin and subcutaneous tissue around the wound edge that requires constant debridement however the wound is smaller we are using a total contact cast in this area. Alginate all wounds 09/07/18; without much surprise the MRI of his left foot showed osteomyelitis in the reminiscent of his fourth metatarsal but also the third metatarsal. She arrives in clinic today with the right foot and a total contact cast. There was purulent drainage coming out of the wound which I have cultured. Marked deterioration here with undermining widely around the wound orifice. Using pickups and a scalpel I remove callus and subcutaneous tissue from a substantial new opening. In a similar fashion the area on the left lateral foot that was blistered last week I have opened this and remove skin and subcutaneous tissue from this area to expose a obvious new wound. I think there is extension and communication between all of this on the left foot. The patient is on Fortaz at dialysis. Culture done of the right foot. We are clearly not making progress here. We have made him an appointment with Dr. [name] of orthopedics. I think an amputation of the left leg may be discussed. I don't think there is anything that can be done with foot salvage.  09/14/18; culture from the right foot last time showed a very resistant MRSA. This is resistant to doxycycline. I'm going to try to get linezolid at least for a week. He does not have a appointment with Dr. Graves yet. 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 He is finished Fortaz at dialysis. I'll send a message to Dr. Hatcher of  infectious disease. I want the patient to see Dr. Graves to go over the pros and cons of an amputation which I think will be a BKA. The patient had a question about whether this is curative or not. I told him that I thought it would be although spread of staph aureus infection is not unheard of. MRI of the right foot was done at the end of April 2019 did not show osteomyelitis. The patient last saw Dr. Hatcher on 9/3. I'll send Dr. Hatcher a message. I would really like him to weigh the pros and cons of a BKA on the left otherwise he'll probably need IV antibiotics and perhaps hyperbaric oxygen again. He has not had a good response to this in the past. His MRI earlier this month showed progressive damage in the remnants of the fourth and third metatarsal 09/21/2018; sees Dr. Graves of orthopedics next week to discuss a left BKA in response to the osteomyelitis in the left foot. Using silver alginate to all wound areas. He is completing the Zyvox I did put in for him after last culture showed MRSA 09/29/2018; patient saw Dr. Graves of orthopedics discussed the osteomyelitis in the left foot third and fourth metatarsals. He did not recommend urgent surgery but certainly stated the only surgical option would be a BKA. He is communicating with Dr. Hatcher. The problem here is the instability of the areas on his foot which constantly generate draining abscesses. I will communicate with Dr. Hatcher about this. He has an appointment on 10/23 10/05/2018; patient sees Dr. Hatcher on 10/23. I have sent him a secure message not ordered any additional antibiotics for now. Patient continues to have 2 open areas on the left plantar foot at the fifth metatarsal head and the lateral aspect of the foot. These have not changed all that much. The area on the right third met head still has thick callus and surrounding subcutaneous tissue we have been using silver alginate 10/12/2018; patient sees Dr. Hatcher or colleague on  10/23. 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 The area on the right plantar foot is just about closed. The superior wound on the left fifth metatarsal head is callused over but I am not sure if this is closed. The area below it is about the same. Considerable amount of callus on the lateral foot. We have been using silver alginate to the wounds 10/19/2018; the patient saw Dr. Hatcher on 10/22. He wishes to try and continue to save the left foot. He has been given 6 weeks of oral ciprofloxacin. We continue to put a total contact cast on the right foot. The area over the fifth metatarsal head on the left is callused over/perhaps healed but I did not remove the callus to find out. He still has the wound on the left lateral midfoot requiring debridement. We have been using silver alginate to all wound areas 10/26/2018 On the left foot our intake nurse noted some purulent drainage from the inferior wound [currently the only one that is not callused   over] On the right foot even though he is in total contact cast a considerable amount of thick black callus and surface eschar. On this side we have been using silver alginate under a total contact cast. he remains on ciprofloxacin as prescribed by Dr. Hatcher 11/02/2018; culture from last week which is done of the probing area on the left midfoot wound showed MRSA "few". I am going to need to contact Dr. Hatcher which I will do today I think he is going to need IV antibiotics again. The area on the left foot which was callused on the side is clearly separating today all of this was removed a copious amounts of callus and necrotic subcutaneous tissue. The area on the right plantar foot actually remained healthy looking with a healthy granulated base. We are using silver alginate to all wound areas 11/09/2018; unfortunately [name] 1 of the patient's wounds areas looks at all satisfactory. On  the left he has considerable necrotic debris from the plantar wound laterally over the foot. I removed copious amounts of material including callus skin and subcutaneous tissue. On the right foot he arrives with undermining and frankly purulent drainage. Specimen obtained for culture and debridement of the callused skin and subcutaneous tissue from around the circumference. Because of this I cannot put him back in a total contact cast but to be truthful we are really unfortunately not making a lot of progress I did put in a secure text message to Dr. Hatcher wondering about the MRSA on the left foot. He suggested vancomycin although this is not started. I was left wondering if he expected me to send this into dialysis. Has we have more purulence on the right side wait for that result before calling dialysis 11/16/2018; I called dialysis earlier this week to get the vancomycin started. I think he got the first dose on Tuesday. The patient tells me that he fell earlier this week twisting his left foot and ankle and he is swelling. He continues to not look well. He had blood cultures done at dialysis on Tuesday he is not been informed of the results. 12/07/2018; the patient was admitted to hospital from 11/22/2018 through 12/02/2018. He underwent a left BKA. Apparently had staph sepsis. In the meantime being off the right foot this is closed over. He follows up with Dr. 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 Subsequent to this he actually had multiple falls in fact he tells me to following which calls the wound to be his apparently. He has been seeing Dr. Graves and his physician assistant in the interim. They actually did want him to come back for reevaluation to see if there's anything we can do for me wound care perspective the help this area to heal more appropriately. The patient  states that he does not have a tremendous amount of pain which is good news. No fevers, chills, nausea, or vomiting noted at this time. We have gotten approval from Dr. Graves office had [facility] for us to treat the patient for his stop wound. This is due to the fact that the patient was in the 90 day postop global. 1/21; the patient does not have an open wound on the right foot although he does have some pressure areas that will need to be padded when he is transferring. The dehisced surgical wound looks clean although there is some undermining of 1.5 cm   superiorly. 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 This appears healthy but there is undermining. TheraSkin #1 2/11; TheraSkin #2 2/25; TheraSkin #3. Wound is measuring smaller 3/10; TheraSkin #4 wound is measuring smaller 3/24; TheraSkin #5 wound is measuring smaller and looks healthy. 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 He has no open area on the right foot although he does have some callused area over the original third metatarsal head wound. The third metatarsal is also subluxed on the right foot. I have warned him today that he cannot consider wearing a prosthesis for at least a month but he can go for measurements. He is going to need to keep the right foot padded is much as possible in his diabetic shoe indefinitely READMISSION 06/12/2019 Mr. Buske is a type II diabetic on dialysis. He has been in this clinic multiple times with wounds on his bilateral lower extremities. 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 He also has a history of wounds on his right foot but tells me that everything is going well here. He actually came in here with his prosthesis walking with a cane. We were all really quite gratified to see  this He states over the last several weeks he has had a painful area at the tip of the left third finger. His dialysis shunt in his is in the left upper arm and this particularly hurts during dialysis when his fingers get numb and there is a lot of pain in the tip of his left third finger. I believe he is seen vascular surgery. He had a Doppler done to evaluate for a dialysis steal syndrome. He is going for a banding procedure 1 week from today towards the left AV fistula. I think if that is unsuccessful they will be looking at creating a new shunt. He had a course of doxycycline when he which he finished about a week ago. He has been using Bactroban to the finger 6/25; the patient had his banding procedure and things seem to be going better. He is not having pain in his hand at dialysis. The area on the tip of his finger seems about closed. He did however have a paronychia I the last time I saw him. I have been advising him to use topical Bactroban washing the finger. Culture I did last time showed a few staph epidermidis which I was willing to dismiss as superficial skin contaminant. He arrives today with the finger wound looking better however the paronychia a seems worse 7/9; 2-week follow-up. He does not have an open wound on the tip of his third left finger. I thought he had an initial ischemic wound on the tip of the finger as well as a paronychia a medially. All of this appears to be closed. 8/6-Patient returns after 3 weeks for follow-up and has a right second met head plantar callus with a significant area of maceration hiding an ulcer ABI repeated today on right - 1.26 8/13-Patient returns at 1 week, the right second met head plantar wound appears to be slightly better, it is surrounded by the callus area we are using silver alginate 8/20; the patient came back to clinic 2 weeks ago with a wound on the right second metatarsal head. He apparently told me   he developed callus in this area  but also went away from his custom shoes to a pair of running shoes. Using silver alginate. 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 There might be much we can do to offload this although the patient tells me he has a wheelchair that he is using to try and stay off the wound is much as possible 8/27; right second metatarsal head. Still small punched out area with thick callus and subcutaneous tissue around the wound. We have been using silver collagen. This is always been an issue with this man's wound especially on this foot 9/3; right second met head. Still the same punched-out area with thick callus and subcutaneous tissue around the wound removing the circumference demonstrates repetitively undermining area. I have removed all the subcutaneous tissue associated with this. We have been using silver collagen 9/15; right second metatarsal head. Same punched-out thick callus and subcutaneous tissue around the wound area. Still requiring debridement we have been using silver collagen I changed back to silver alginate X-ray last time showed no evidence of osteomyelitis. Culture showed Klebsiella and he was started and Keflex apparently just 4 days ago 9/24; right second metatarsal head. He is completed the antibiotics I gave him for 10 days. Same punched-out thick callus and subcutaneous tissue around the area. Still requiring debridement. We have been using silver alginate. The area itself looks swollen and this could be just [place] that comes on this area with walking on a prosthesis on the left however I think he needs an MRI and I am going to order that today. There is not an option to offload this further 10/1; right second plantar metatarsal head. MRI booked for October 6. Generally looking better today using silver alginate 10/8; right second plantar metatarsal head. MRI that was done on 10/6 was negative for osteomyelitis noted prior amputations of the second and fourth  toes at the MTP. Prior amputation of the third toe to the base of the middle phalanx. Prior amputation of the fifth toe to the head of the metatarsal. 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 He does not recall about hearing about this. He did not have any pain but then again he has reduced sensation from diabetic neuropathy 10/15; right second plantar metatarsal head. Still in the same condition callus nonviable subcutaneous tissue which cleans up nicely with debridement but reforms by the next week. The patient tells me that he is offloading this is much as he can including taking his wheelchair to dialysis. We have been using silver alginate, changed to silver collagen today 10/22; right second plantar metatarsal head. Wound measures smaller but still thick callus around this wound. Still requiring debridement 11/5 right second plantar metatarsal head. Less callus around the wound circumference but still requiring debridement. There is still some undermining which I cleaned out the wound looks healthy but still considerable punched out depth with a relatively small circumference to the wound there is no palpable bone no purulent drainage 11/12; right second plantar metatarsal head. Small wound with thick callus tissue around the wound and significant undermining. We have been using silver collagen after my continuous debridements of this area 11/19; patient arrives in today with purulent drainage coming out of the wound in the second third met head area on the right foot. Serosanguineous thick drainage. Specimen obtained for culture. 11/21/2019 on evaluation today patient appears to be doing well with   regard to his foot ulcer. The good news is is culture came back negative for any bacteria there was no growth. The other good news is his x-ray also appears to be doing well. The foot is also measuring much better than what it was. Overall I am very  pleased with how things seem to be progressing. 12/22; patient was in [place] for several weeks due to the death of a family member. He said he stayed off the wound using silver alginate. 01/03/2020. Patient has a small open area with roughly 0.3 cm of circumferential undermining. The orifice looks smaller. We have been using silver collagen. There is not a wound more aggressive way to offload this area as he has a prosthesis on the other leg 1/21; again the same small area is 2 weeks ago. We have been using silver collagen I change that to endoform today I really believe that this is an offloading issue 2/4; the area on the right third met head. We have been using endoform. 2/11 the area on the right third met head we have been using silver alginate. 2/25; the area on the right third met head. There is much less callus on this today. The patient states he is offloading this even more aggressively. As an example he is taking his wheelchair into dialysis on most days 3/4; right third metatarsal head. Again he has eschar over the surface of the wound which I removed with a #3 curette. Some subcutaneous debris. This has 0.8 cm of direct probing depth. I cannot feel any bone here. I did do a culture the area 3/11; right third metatarsal head. Again an eschared area over the wound which almost makes this look superficial. Again debridement reveals a wound with probing depth probably not much different from last week there is no palpable bone no palpable purulence. The patient tells me that he is making every effort to offload this area properly. He is taking wheelchair into dialysis etc. There is no option for forefoot offloading or a total contact cast. We used Oasis #1 today 3/18; again callus and eschar over the wound surface. I remove this but still a probing wound although only 3 mm in depth this time. This is an improvement 3/25; again callus and eschar over the wound surface which I  removed the wound is much deeper today at 0.7 cm. There is no palpable bone. Because of the appearance of this a culture was done. I am not going to put the Oasis back in this. Concerned about underlying infection. Notable for the fact that he is just finished doxycycline for the last go round of this 3/30; still 0.7 cm in depth which is disappointing. Culture I gave last time showed a few Staph aureus which is methicillin susceptible. This should have been taken care of by the recent course of doxycycline I gave him. We are using silver alginate 4/6; almost 1 cm in depth. We have been using silver alginate with underlying Bactroban. 4/15; about 1 cm in depth still. There is no palpable bone but there is undermining thick surface again as usual. Our intake nurse noted what looked to be purulent drainage I suppose that could have been Bactroban but I did a culture for CandS 4/20; depth down 0.7 cm. Culture from last time was negative we have been using Bactroban and silver alginate 4/29; depth down 0.5 cm. Using silver alginate. He reports some drainage 5/6; comes in with the wound worse. Wound is deeper and   tunnels. Measures 0.9 depth and an undermining area of 1.8 cm. 5/18; there is a change the dressing last week. Still using Sorbact hydrogel. What I can see of the base of the wound looks better. 6/1. Not much change. I changed him to endoform today. He has 1.1 cm in depth no palpable bone 6/8; again a small punched out wound with significant undermining. Culture I did last week was negative. T oday he had some purulent drainage perhaps mixed with some retained endoform. 6/22; MRI report below IMPRESSION: Skin ulceration on the plantar surface of the foot at the level of the head of the second metatarsal. Mild marrow edema in the head of the second metatarsal could be reactive or due to osteomyelitis. Thin fluid collection surrounding the head and neck of the second metatarsal appears to  communicate with the patient's skin wound and is worrisome for a septic collection. Advanced first MTP osteoarthritis. Prior amputations as noted above. Electronically Signed By: Inge Rise M.D. On: 06/16/2020 11:51 PCR culture I did last time I believe showed peptostreptococcus. There was no comment on any other gram-negative or gram-positive organisms. I gave him a course of Flagyl which I think he is finishing up. We are not making any progress with the wound over the right second/third metatarsal head. We are using silver alginate putting him in kerlix Coban 6/29; has his appointment with Dr. [name] of orthopedics next Tuesday I think we see him on the same day. He was in for a nurse visit today. Nurse noted undermining of skin and callus so I was asked to look at this. We have been using silver alginate and kerlix Coban 7/6; patient saw Dr. [name] PA today will see Dr. [name] on Tuesday. The major question is does he needs surgery to clean out the area in question culture question resect any involved bone etc. Otherwise he will need to see infectious disease for broad-spectrum antibiotics given at dialysis Electronic Signature(s) Signed: 07/01/2020 5:09:59 PM By: Linton Ham MD Entered By: Linton Ham on 07/01/2020 15:50:33 -------------------------------------------------------------------------------- Physical Exam Details Patient Name: Date of Service: Kateri Plummer. 07/01/2020 2:30 PM Medical Record Number: [id number] Patient Account Number: [id number] Date of Birth/Sex: Treating RN: Sep 07, 1951 (69 y.o. [name] Primary Care Provider: [name] Other Clinician: Referring Provider: Treating Provider/Extender: Tanna Savoy in Treatment: 63 Constitutional Patient is hypertensive.. Pulse regular and within target range for patient.Marland Kitchen Respirations regular, non-labored and within target range.. Temperature is normal and within the  target range for the patient.Marland Kitchen Appears in no distress. Cardiovascular Pedal pulses palpable on the right. Notes Wound exam; small punched out area cleaner looking wound surface. This does not probe deeply. Electronic Signature(s) Signed: 07/01/2020 5:09:59 PM By: Linton Ham MD Entered By: Linton Ham on 07/01/2020 15:51:33 -------------------------------------------------------------------------------- Physician Orders Details Patient Name: Date of Service: DOYL, BITTING 07/01/2020 2:30 PM Medical Record Number: [id number] Patient Account Number: [id number] Date of Birth/Sex: Treating RN: July 24, 1951 (69 y.o. 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 Dressing Change Frequency Wound #41 Right Metatarsal head second Do not  change entire dressing for one week. Skin Barriers/Peri-Wound Care Moisturizing lotion Wound Cleansing May shower with protection. - patient to use a cast protector. Primary Wound Dressing Wound #41 Right Metatarsal head second Calcium Alginate with Silver Secondary Dressing Wound #41 Right Metatarsal head second Dry Gauze Other: - felt to offload periwound. Edema Control Kerlix and Coban - Right Lower Extremity Avoid standing for long periods of time Elevate legs to the level of the heart or above for 30 minutes daily and/or when sitting, a frequency of: - throughout the day Additional Orders / Instructions Other: - minimize walking and  standing on foot to aid in wound healing and callus buildup. Electronic Signature(s) Signed: 07/01/2020 5:09:59 PM By: Baltazar Najjar MD Signed: 07/01/2020 5:26:11 PM By: Zandra Abts RN, BSN Entered By: Zandra Abts on 07/01/2020 15:33:51 -------------------------------------------------------------------------------- Problem List Details Patient Name: Date of Service: JAVONTAY, [name] 07/01/2020 2:30 PM Medical Record Number: [id number] Patient Account Number: [id number] Date of Birth/Sex: Treating RN: Mar 14, 1951 (69 y.o. 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, other extremity 06/12/2019 No Yes L03.115 Cellulitis of right lower limb 11/15/2019 No Yes Inactive Problems ICD-10 Code Description Active Date Inactive Date L03.114 Cellulitis of left upper limb 06/12/2019 06/12/2019 L98.498 Non-pressure chronic ulcer of skin of other sites with other specified severity 06/12/2019 06/12/2019 S61.209D Unspecified open wound of unspecified finger without damage to nail, subsequent 06/12/2019 06/12/2019 encounter Resolved Problems Electronic Signature(s) Signed: 07/01/2020 5:09:59 PM By: Baltazar Najjar MD Entered By: Baltazar Najjar on 07/01/2020 15:46:04 -------------------------------------------------------------------------------- Progress Note Details Patient Name: Date of Service: Sherrell Puller. 07/01/2020 2:30 PM Medical Record Number: [id number] Patient Account Number: [id number] Date of Birth/Sex: Treating RN: [birth year] (69 y.o. Elizebeth Koller Primary Care Provider: Knox Royalty Other Clinician: Referring Provider: Treating Provider/Extender: Marlyce Huge in Treatment: 55 Subjective History of Present Illness (HPI) The following HPI elements were documented for the patient's wound: Location: Patient presents with a wound to bilateral feet. Quality: Patient reports experiencing essentially no pain. Severity: Mildly severe wound with no evidence of infection Duration: Patient has had the wound for greater than 2 weeks prior to presenting for treatment The patient is a pleasant 69 yrs old bm here for evaluation of ulcers on the plantar aspect of both feet. 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 He has a left arm graft for access. He has been trying to stay off his feet for weeks but does not seem to have any improvement in the wound. He has been seeing someone at the foot center and was referred to the Wound Care center for further evaluation. 12/30/15 the patient has 3 wounds one over the right first metatarsal head and 2 on the left foot at the left fifth and lleft first metatarsal head. All of these look relatively similar. The one over the left fifth probe to bone I could not prove that any of the others did. I note his MRI in November that did not show osteomyelitis. His peripheral pulses seem robust. All of these underwent surgical debridement to remove callus nonviable skin and  subcutaneous tissue 01/06/16; the patient had his sutures removed from the right fifth ray amputation. There may be a small open part of this superiorly but otherwise the incision looks good. Areas over his right first, left first and left fifth metatarsal head all underwent surgical debridement as a varying degree of callus, skin and nonviable subcutaneous tissue. The area that is most worrisome is the right fifth metatarsal head which has a wound probes precariously  close to bone. There is no purulent drainage or erythema 01/13/16; I'm not exactly sure of the status of the right fifth ray amputation site however he follows with Dr. [name] later this week. The area over his right first plantar metatarsal head, left first and fifth plantar metatarsal head are all in the same status. Thick circumferential callus, nonviable subcutaneous tissue. Culture of the left fifth did not culture last week 01/20/16; all of the patient's wounds appear and roughly the same state although his amputation site on the right lateral foot looks better. His wounds over the right first, left first and left fifth metatarsal heads all underwent difficult surgical debridement removing circumferential callus nonviable skin and subcutaneous tissue. There is no overt evidence of infection in these areas. MRI at the end of December of the left foot did not show osteomyelitis, right foot showed osteomyelitis of the right fifth digit he is now status post amputation. 01/27/16 the patient's wounds over his plantar first and fifth metatarsal heads on the left all appear better having been started on a total contact cast last week. They were debridement of circumferential callus and nonviable subcutaneous tissue as was the wound over the first metatarsal head. His surgical incision on the right also had some light surface debridement done. 03/23/2016 -- the patient was doing really well and most of his wounds had almost completely healed but now he came back today with a history of having a discharge from the area of his right foot on the plantar aspect and also between his first and second toe. He also has had some discharge from the left foot. Addendum: I spoke to the PA [name] at the dialysis center whose fax number is [phone]. We discussed the infection the patient has and she will put the patient on vancomycin and Fortaz until the final culture report is back. We will fax this as  soon as available. 03/30/2016 -- left foot x-ray IMPRESSION:No definitive osteomyelitis noted. X-ray of the right foot -- IMPRESSION: 1. Soft tissue swelling. Prior amputation right fifth digit. No acute or focal bony abnormality identified. If osteomyelitis remains a clinical concern, MRI can be obtained. 2. Peripheral vascular disease. His culture reports have grown an MSSA -- and we will fax this report to his hemodialysis center. He was called in a prescription of oral doxycycline but I have told her not to fill this in as he is already on IV antibiotics. 04/06/2016 -- a few days ago, I spoke to the hemodialysis center nurse who had stopped the IV antibiotics and he was given a prescription for doxycycline 100 mg by mouth twice a day for a week and he is on this at the present time. 05/11/2016 -- he has recently seen his PCP this week and his hemoglobin A1c was 7. He is working on his paperwork to get his orthotic shoes. 05/25/2016 -- -- x-ray of the left foot IMPRESSION:No acute bony abnormality. No radiographic changes of acute osteomyelitis. No change since prior study. X-ray of the right foot -- IMPRESSION:  Postsurgical changes are seen involving the fifth toe. No evidence of acute osteomyelitis. he developed a large blister on the medial part of his right foot and this opened out and drain fluid. 06/01/2016 -- he has his MRI to be done this afternoon. 06/15/2016 -- MRI of the left forefoot without contrast shows 2 separate regions of cutaneous and subcutaneous edema and possible ulceration and blistering along the ball of the foot. No obvious osteomyelitis identified. MRI of the right foot showed cutaneous and subcutaneous thickening plantar to the first digit sesamoid with an ulcer crater but no underlying osteomyelitis is identified. 06/22/2016 -- the right foot plantar ulcer has been draining a lot of seropurulent material for the last few days. 06/30/2016 -- spoke to the dialysis  center and I believe I spoke to Farrell a PA at the center who discussed with me and agreed to putting Legrand Como on vancomycin until his cultures arrive. On review of his culture report no WBCs were seen or no organisms were seen and the culture was reincubated for better growth. The final report is back and there were no predominant growth including Streptococcus or Staphylococcus. Clinically though he has a lot of drainage from both wounds a lot of undermining and there is further blebs on the left foot towards the interspace between his first and second toe. 08/10/2016 -- the culture from the right foot showed normal skin flora and there was no Staphylococcus aureus was group A streptococcus isolated. 09/21/2016 -- -- MRI of the right foot was done on 09/13/2016 - IMPRESSION: Findings most consistent with acute osteomyelitis throughout the great toe, sesamoid bones and plantar aspect of the head of the first metatarsal. Fluid in the sheath of the flexor tendon of the great toe could be sympathetic but is worrisome for septic tenosynovitis. First MTP joint effusion worrisome for septic joint. He was admitted to the hospital on 09/11/2016 and treated for a fever with vancomycin and cefepime. He was seen by Dr. Bobby Rumpf of infectious disease who recommended 6 weeks treatment with vancomycin and ceftazidime with his hemodialysis and to continue to see as in the wound clinic. Vascular consult was pending. The patient was discharged home on 09/14/2016 and he would continue with IV antibiotics for 6 weeks. 09/28/16 wound appears reasonably healthy. Continuing with total contact cast 10/05/16 wound is smaller and looking healthy. Continue with total contact cast. He continues on IV antibiotics 10/12/2016 -- he has developed a new wound on the dorsal aspect of his left big toe and this is a superficial injury with no surrounding cellulitis. He has completed 30 days of IV antibiotics and is now ready  to start his hyperbaric oxygen therapy as per his insurance company's recommendation. 10/19/2016 -- after the cast was removed on the right side he has got good resolution of his ulceration on the right plantar foot. he has a new wound on the left plantar foot in the region of his fourth metatarsal and this will need sharp debridement. 10/26/2016 -- Xray of the right foot complete: IMPRESSION: Changes consistent with osteomyelitis involving the head of the first metatarsal and base of the first proximal phalanx. The sesamoid bones are also likely involved given their positioning. 11/02/2016 -- there still awaiting insurance clearance for his hyperbaric oxygen therapy and hopefully he will begin treatment soon. 11/09/2016 -- started with hyperbaric oxygen therapy and had some barotrauma to the right ear and this was seen by ENT who was prescribed Afrin drops and would probably continue with  HBO and he is scheduled for myringotomy tubes on Friday 11/16/2016 -- he had his myringotomy tubes placed on Friday and has been doing much better after that with some fluid draining out after hyperbaric treatment today. The pain was much better. 11/30/2016 -- over the last 2 days he noticed a swelling and change of color of his right second toe and this had been draining minimal fluid. 12/07/2016 -- x-ray of the right foot -- IMPRESSION:1. Progressive ulceration at the distal aspect of the second digit with significant soft tissue swelling and osseous changes in the distal phalanx compatible with osteomyelitis. 2. Chronic osteomyelitis at the first MTP joint. 3. Ulcerations at the second and third toes as well without definite osseous changes. Osteomyelitis is not excluded. 4. Fifth digit amputation. On 12/01/2016 oo I spoke to Dr. Bobby Rumpf, the infectious disease specialist, who kindly agreed to treat this with IV antibiotics and he would call in the order to the dialysis center and this has been  discussed in detail with the patient who will make the appropriate arrangements. The patient will also book an appointment as soon as possible to see Dr. [name] in the office. Of note the patient has not been on antibiotics this entire week as the dialysis center did not receive any orders from Dr. [name]. I got in touch with Dr. [name] who tells me the patient has an appointment to see him this coming Wednesday. 12/14/2016 -- the patient is on ceftazidime and vancomycin during his dialysis, and I understand this was put on by his nephrologist Dr. Raliegh Ip possibly after speaking with infectious disease Dr. [name] 12/23/16; patient was on my schedule today for a wound evaluation as he had difficulties with his schedule earlier this week. I note that he is on vancomycin and ceftazidine at dialysis. In spite of this he arrives today with a new wound on the base of the right second toe this easily probes to bone. The known wound at the tip of the second toe With this area. He also has a superficial area on the medial aspect of the third toe on the side of the DIP. This does not appear to have much depth. The area on the plantar left foot is a deep area but did not probe the bone 12/28/16 -- he has brought in some lab work and the most recent labs done showed a hemoglobin of 12.1 hematocrit of 36.3, neutrophils of 51% WBC count of 5.6, BN of 53, albumin of 4.1 globulin of 3.7, vancomycin 13 g per mL. 01/04/2017 -- he saw Dr. [name] who has recommended a amputation of the right second toe and he is awaiting this date. He sees Dr. Bobby Rumpf of infectious disease tomorrow. The bone culture taken on 12/28/2016 had no growth in 2 days. 01/11/2017 -- was seen by Dr. Bobby Rumpf regarding the management and has recommended a eval by vascular surgeons. He recommended to continue the antibiotics during his hemodialysis. Xray of the left foot -- IMPRESSION: No acute fracture, dislocation, or osseous erosion  identified. 01/18/2017 -- he has his vascular workup later today and he is going to have his right foot second toe amputation this coming Friday by Dr. [name]. We have also put in for a another 30 treatments with hyperbaric oxygen therapy 01/25/2017 -- I have reviewed Dr. Donnetta Hutching is vascular report from last week where he reviewed him and thought his vascular function was good enough to heal his amputation site and no further tests were recommended.  He also had his orthopedic related to surgery which is still pending the notes and we will review these next week. 02/01/2017 -- the operative remote of Dr. 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 02/08/2017 --he was seen by Dr. [name] on 02/03/2017 -- patient is on [place]. after a thorough review he had recommended to continue with antibiotics during hemodialysis. The patient was seen by Dr. [name], but I do not find any follow-up note on the electronic medical record. he has removed the dressing over the right foot to remove the sutures and asked him to see him back in a month's time 02/22/2017 -- patient has been febrile and has been having symptoms of the upper respiratory tract infection but has not been checked for the flu and it's been over 4 days now. He says he is feeling better today. Some drainage between his left first and second toe and this needed to be looked at 03/08/2017 -- was seen by Dr. Bobby Rumpf on 03/07/2017 after review he will stop his antibiotics and see him back in a month to see if he is feeling well. 03/15/2017 -- he has completed his course of hyperbaric  oxygen therapy and is doing fine with his health otherwise. 03/22/2017 -- his nutritionist at the dialysis center has recommended a protein supplement to help build his collagen and we will prescribe this for him when he has the details 05/03/2017 -- he recently noticed the area on the plantar aspect of his fifth metatarsal head which opened out and had minimal drainage. 05/10/2017 -- -- x-ray of the left foot -- IMPRESSION:1. No convincing conventional radiographic evidence of active osteomyelitis.2. Active soft tissue ulceration at the tip of the fifth digit, and at the lateral aspect of the foot adjacent the base of the fifth metatarsal. 3. Surgical changes of prior fourth toe amputation. 4. 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 5. Small vessel atherosclerotic vascular calcifications. 6. 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 ======= Readmission after 5 weeks: 06/15/2017 -- the patient returns after 5 weeks having had an MRI done on 05/17/2017 MRI of the left foot without contrast showed soft tissue ulcer overlying the base of the fifth metatarsal. 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 No drainable fluid collection to suggest an abscess. He was in hospital between 05/16/2017 and 05/25/2017 -- and on discharge was asked to follow-up with Dr. [name] and Dr. [name]. He was treated 2 weeks post discharge with vancomycin plus ceftriaxone with hemodialysis and oral metronidazole 500 mg 3 times a day. 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 last hemoglobin A1c was 6.7% He had a postoperative MR of the foot on 05/23/2017 -- which showed no new areas of cortical bone loss, edema or soft tissue ulceration to suggest osteomyelitis. the patient has completed his course of IV antibiotics  during dialysis and is to see the infectious disease doctor tomorrow. Dr. [name] had seen him after suture removal and asked him to keep the wound with a  dry dressing 06/21/2017 -- he was seen by Dr. Bobby Rumpf of infectious disease on 06/16/2017 -- he stopped his Flagyl and will see him back in 6 weeks. He was asked to follow-up with Dr. [name] and with the wound clinic clinic. 07/05/17; bone biopsy from last time showed acute osteomyelitis. This is from the reminiscent in the left fifth metatarsal. Culture result is apparently still pending [holding for anaerobe}. From my understanding in this case this is been a progressive necrotic wound which is deteriorated markedly over the last 3 weeks since he returned here. He now has a large area of exposed bone which was biopsied and cultured last week. Dr. Graylon Good has put him on vancomycin and Fortaz during his hemodialysis and Flagyl orally. He is to see Dr. [name] next week 07/19/2017 -- the patient was reviewed by Dr. [name] of orthopedics who reviewed the case in detail and agreed with the plan to continue with IV antibiotics, aggressive wound care and hyperbaric oxygen therapy. He would see him back in 3 weeks' time 08/09/2017 -- saw Dr. Bobby Rumpf on 08/03/2017 -he was restarted on his antibiotics for 6 weeks which included vanco, ceftaz and Flagyl. he recommended continuing his antibiotics for 6 weeks and reevaluate his completion date. He would continue with wound care and hyperbaric oxygen therapy. 08/16/2017 -- I understand he will be completing his 6 weeks of antibiotics sometime later this week. 09/19/2017 -- he has been without his wound VAC for the last week due to lack of supplies. He has been packing his wound with silver alginate. 10/04/2017 -- he has an appointment with Dr. [name] tomorrow and hence we will not apply the wound VAC after his dressing changes today. 10/11/2017 -- he was seen by Dr. Bobby Rumpf on 10/05/2017,  noted that the patient was currently off antibiotics, and after thorough review he recommended he follow-up with the wound care as per plan and no further antibiotics at this point. 11/29/17; patient is arrived for the wound on his left lateral foot.Marland Kitchen He is completed IV antibiotics and 2 rounds of hyperbaric oxygen for treatment of underlying osteomyelitis. He arrives today with a surface on most of the wound area however our intake nurse noted drainage from the superior aspect. This was brought to my attention. 12/13/2017 -- the right plantar foot had a large bleb and once the bleb was opened out a large callus and subcutaneous debris was removed and he has a plantar ulcer near the fourth metatarsal head 12/28/17 on evaluation today patient appears to be doing acutely worse in regard to his left foot. The wound which has been appearing to do better as now open up more deeply there is bone palpable at the base of the wound unfortunately. He tells me that this feels "like it did when he had osteomyelitis previously" he also noted that his second toe on the left foot appears to be doing worse and is swollen there does appear to be some fluid collected underneath. His right foot plantar ulcer appears to be doing somewhat better at this point and there really is no complication at the site currently. No fevers, chills, nausea, or vomiting noted at this time. Patient states that he normally has no pain at this site however. T oday he is having significant pain. 01/03/18; culture done last week showed methicillin sensitive staph aureus and group B strep. He was on [place] however he arrives today with a fever of 101. He has a functional dialysis shunt in his  left arm but has had no pain here. No cough. He still makes some urine no dysuria he did have abdominal pain nausea and diarrhea over the weekend but is not had any diarrhea since yesterday. Otherwise he has no specific complaints 01/05/18; the patient returns  today in follow-up for his presentation from 1/8. At that point he was febrile. I gave him some Levaquin adjusted for his dialysis status. He tells me the fever broke that night and it is not likely that this was actually a wound infection. He is gone on to have an MRI of the left foot; this showed interval development of an abnormal signal from the base of the third and fourth metatarsal and in the cuboid reminiscent consistent with osteomyelitis. There is no mention of the left second toe I think which was a concern when it was ordered. The patient is taking his Levaquin 01/10/18; the patient has completed his antibiotics today. The area on the left lateral foot is smaller but it still probes easily to bone. He has underlying osteomyelitis here and sees Dr. Megan Salon of infectious disease this week ooThe area on the right third plantar metatarsal head still requires debridement not much change in dimensions [facility] has a new wound on the medial tip of his right third toe again this probes to bone. He only noticed this 2 days ago 01/17/18; the patient saw Dr. [name] last week and he is back on Vanco and Fortaz at dialysis. 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 We are previously seeing him for a wound also on the base of the fourth med head on the right. X-ray of this area did not show osteomyelitis in relation to a new wound on the tip of his right third toe. Again this probes to bone. Finally his second toe on the left which didn't really show anything on the MRI at least no report appears to have a separated cutaneous area which I think is going to come right off the tip of his toe and leave exposed bone. Whether this is infectious or ischemic I am not clear Culture I did from the third toe last week which was his new wound was negative. Plain x-ray on the right foot did not show  osteomyelitis in the toes 01/24/18; the patient is going went to see Dr. [name]. He is going to have a amputation of the left second and right third toe although paradoxically the left second toe looks better than last week. We have treating a deep probing wound on the left lateral foot and the area over the third metatarsal head on the right foot. 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 Also a debridement including bone of the left lateral foot and a closure which is still sutured. This is a bit surprising. Also apparent debridement of the base of the right third toe wound. 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 I'm not sure exactly where we are in the frame of things here. The wound on the left lateral foot is still sutured also the amputation sites of the left second and right third toe. He is been  using Xeroform to the sutured areas silver alginate to the right foot 02/14/18; he continues on Rocephin at dialysis for osteomyelitis. I still don't have a good sense of where we are in the treatment duration. The wounds on the left lateral foot had the sutures removed and this clearly still probes deeply. We debrided the area with a #5 curet. We'll use silver alginate to the wound oothe area over the right third met head also required debridement of callus skin and subcutaneous tissue and necrotic debris over the wound surface. This tunnels superiorly but I did not unroofed this today. ooThe areas on the tip of his toes have a crusted surface eschar I did not debridement this either. 02/21/18; he continues on Rocephin at dialysis for osteomyelitis. I don't have a good sense of where we are and the treatment duration. The wounds on the left lateral foot is callused  over on presentation but requires debridement. The area on the right third med head plantar aspect actually is measuring smaller 02/28/18;patient is on Vanco and Fortaz at dialysis, I'm not sure why I thought this was Rocephin above unless it is been changed. I don't have a good sense of time frame here. Using silver collagen to the area on the left lateral foot and right third med head plantar foot 03/07/18; patient is completing bank and Fortaz at dialysis soon. He is using Silver collagen to the area on the left lateral foot and the right third metatarsal head. He has a smaller superficial wound distal to the left lateral foot wound. 03/14/18-he is here in follow-up evaluation for multiple ulcerations to his bilateral feet. He presents with a new ulceration to the plantar aspect of the left foot underneath the blister, this was deroofed to reveal a partial thickness ulcer. He is voicing no complaints or concerns, tolerated dialysis yesterday. We will continue with same treatment plan and he will follow-up next week 03/21/18; the patient has a area on the lateral left foot which still has a small probing area. The overall surface area of the wound is better. He presented with a new ulceration on the left foot plantar fifth metatarsal head last week. The area on the right third metatarsal head appears smaller.using silver alginate to all wound areas. The patient is changing the dressing himself. He is using a [place] forefoot offloading are on the right and a healing sandal on the left 03/28/18; original wound left lateral foot. Still nowhere close to looking like a heeling surface oosecondary wound on the left lateral foot at the base of his question fifth metatarsal which was new last week ooRight third metatarsal head. ooWe have been using silver alginate all wounds 04/04/18; the original wound on the left lateral foot again heavy callus surrounding thick nonviable subcutaneous tissue all requiring  debridement. The new wound from 2 weeks ago just near this at the base of the fifth metatarsal head still looks about the same ooUnfortunately there is been deterioration in the third medical head wound on the right which now probes to bone. I must say this was a superficial wound at one point in time and I really don't have a good frame of reference year. I'll have to go back to look through records about what we know about the right foot. 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 He is also been to see Dr. [name]. Somewhere in here as somebody is  ordered a VASCULAR evaluation which was done on 03/22/18. On the right is anterior tibial artery was monophasic triphasic at the posterior tibial artery. On the left anterior tibial artery monophasic posterior tibial artery monophasic. ABI in the right was 1.43 on the left 1.21. TBIs on the right at 0.41 and on the left at 0.32. A vascular consult was recommended and I think has been arranged. 04/11/18; Mr. Rosengren has not had an MRI of the right foot since 2017. Recent x-ray of the right foot done in January and February was negative. However he has had a major deterioration in the wound over the third met head. He is completed antibiotics last week at dialysis [place]. I think he is going to see vascular in follow-up. The areas on the left lateral foot and left plantar fifth metatarsal head both look satisfactory. I debrided both of these areas although the tissue here looks good. The area on the left lateral foot once probe to bone it certainly does not do that now 04/18/18; MRI of the right foot is on Thursday. He has what looks to be serosanguineous purulent drainage coming out of the wound over the right third metatarsal head today. He completed antibiotics bank and Fortaz 2 weeks ago at dialysis. He has a vascular follow-up with regards to his  arterial insufficiency although I don't exactly see when that is booked. The areas on the left foot including the lateral left foot and the plantar left fifth metatarsal head look about the same. 04/25/18-He is here in follow-up evaluation for bilateral foot ulcers. MRI obtained was negative for osteomyelitis. Wound culture was negative. We will continue with same treatment plan he'll follow-up next week 05/02/18; the right plantar foot wound over the third metatarsal head actually looks better than when I last saw this. His MRI was negative for osteomyelitis wound culture was negative. ooOn the left plantar foot both wounds on the plantar fifth metatarsal head and on the lateral foot both are covered in a very hard circumferential callus. 05/09/18; right plantar foot wound over the third metatarsal head stable from last week. ooLeft plantar foot wound over the fifth metatarsal head also stable but with callus around both wound areas ooThe area on the left lateral foot had thick callus over a surface and I had some thoughts about leaving this intact however it felt boggy. ooWe've been using silver alginate all wounds 05/16/18; since the patient was last here he was hospitalized from 5/15 through 5/19. 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 This grew MRSA. He was placed on vancomycin.MRI of the left foot suggested "progressive" osteomyelitis and bone destruction of the cuboid and the base of the fifth metatarsal. Stable erosive changes of the base of the second metatarsal. I'm wondering if they're aware that he had surgery and debridement in the area of the underlying bone previously by Dr. [name]. In any case, He was also revascularized with an angioplasty and stenting of the left tibial peroneal trunk and angioplasty of the left posterior tibial artery. He is on vancomycin at dialysis. He has  a 2 week follow-up with infectious disease. He has vascular surgery follow-up. He was started on Plavix. 05/23/18; the patient's wound on the lateral left foot at the level of the fifth metatarsal head and the plantar wound on the plantar fifth metatarsal head both look better. The area on  the right third metatarsal head still has depth and undermining. We've been using silver alginate. He is on vancomycin. He has been revascularized on the left 05/30/18; he continues on vancomycin at dialysis. Revascularized on the left. The area on the left lateral foot is just about closed. Unfortunately the area over the plantar fifth metatarsal head undermining superiorly as does the area over the right third metatarsal head. Both of these significantly deteriorated from last week. He has appointments next week with infectious disease and orthopedics I delayed putting on a cast on the left until those appointments which therefore we made we'll bring him in on Friday the 14th with the idea of a cast on the left foot 06/06/18; he continues on vancomycin at dialysis. Revascularized on the left. He arrives today with a ballotable swelling just above the area on the left lateral foot where his previous wound was. He has no pain but he is reasonably insensate. The difficult areas on the plantar left fifth metatarsal head looks stable whereas the area on the right third plantar metatarsal head about the same as last week there is undermining here although I did not unroofed this today. He required a considerable debridement of the swelling on the left lateral foot area and I unroofed an abscess with a copious amount of brown purulent material which I obtained for culture. I don't believe during his recent hospitalization he had any further imaging although I need to review this. Previous cultures from this area done in this clinic showed MRSA, I would be surprised if this is not what this is currently even though he is on  vancomycin. 06/13/18; he continues on vancomycin at dialysis however he finishes this on Sunday and then graduates the doxycycline previously prescribed by Dr. Ninetta Lights. The abscess site on the lateral foot that I unroofed last time grew moderate amounts of methicillin-resistant staph aureus that is both vancomycin and tetracycline sensitive so we should be okay from that regard. He arrives today in clinic with a connection between the abscess site and the area on the lateral foot. We've been using silver alginate to all his wound areas. The right third metatarsal head wound is measuring smaller. Using silver alginate on both wound areas 06/20/18; transitioning to doxycycline prescribed by Dr. Ninetta Lights. The abscess site on the lateral foot that I unroofed 2 weeks ago grew MRSA. That area has largely closed down although the lateral part of his wound on the fifth metatarsal head still probes to bone. I suspect all of this was connected. ooOn the right third metatarsal head smaller looking wound but surrounded by nonviable tissue that once again requires debridement using silver alginate on both areas 06/27/18; on doxycycline 100 twice a day. The abscess on the lateral foot has closed down. Although he still has the wound on the left fifth metatarsal head that extends towards the lateral part of the foot. The most lateral part of this wound probes to bone ooRight third metatarsal head still a deep probing wound with undermining. Nevertheless I elected not to debridement this this week ooIf everything is copious static next week on the left I'm going to attempt a total contact cast 07/04/18; he is tolerating his doxycycline. The abscess on the left lateral foot is closed down although there is still a deep wound here that probes to bone. We will use silver collagen under the total contact cast Silver collagen to the deep wound over the right third metatarsal head is well 07/11/18; he is  tolerating  doxycycline. The left lateral fifth plantar metatarsal head still probes deeply but I could not probe any bone. We've been using silver collagen and this will be the second week under the total contact cast ooSilver collagen to deep wound over the right third metatarsal head as well 07/18/18; he is still taking doxycycline. The left lateral fifth plantar metatarsal this week probes deeply with a large amount of exposed bone. Quite a deterioration. He required extensive debridement. Specimens of bone for pathology and CNS obtained ooAlso considerable debridement on the right third metatarsal head 07/25/18; bone for pathology last week from the lateral left foot showed osteomyelitis. CandS of the bone Enterobacter. And Klebsiella. He is now on ciprofloxacin and the doxycycline is stopped [Dr. [name] of infectious disease] area He has an appointment with Dr. [name] tomorrow I'm going to leave the total contact cast off for this week. May wish to try to reapply that next week or the week after depending on the wound bed looks. I'm not sure if there is an operative option here, previously is followed with Dr. [name] 08/01/18 on evaluation today patient presents for reevaluation. He has been seen by infectious disease, Dr. [name], and he has placed the patient back on Ceftaz currently to be given to him at dialysis three times a week. 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 Fortunately there does not appear to be the evidence of infection at this point in time. Overall the patient has been tolerating the dressing changes without complication. We are no longer due lives in the cast of left foot secondary to the infection obviously. 08/10/18 on evaluation today patient appears to be doing rather well all things considered in regard to his left plantar foot ulcer. He does have a significant callous on the lateral portion of his left foot  he wonders if I can help clean that away to some degree today. Fortunately he is having no evidence of infection at this time which is good news. No fevers chills noted. With that being said his right plantar foot actually does have a significant callous buildup around the wound opening this seems to not be doing as well as I would like at this point. I do believe that he would benefit from sharp debridement at this site. Subsequently I think a total contact cast would be helpful for the right foot as well. 08/17/18; the patient arrived today with a total contact cast on the right foot. This actually looks quite good. He had gone to see Dr. Megan Salon of infectious disease about the osteomyelitis on the left foot. I think he is on IV Fortaz at dialysis although I am not exactly sure of the rationale for the Fortaz at the time of this dictation. He also arrived in today with a swelling on the lateral left foot this is the site of his original wounds in fact when I first saw this man when he was under Dr. Ardeen Garland care I think this was the site of where the wound was located. It was not particularly tender however using a small scalpel I opened it to [place] some moderate amount of purulent drainage. 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 Last MRI was in May that showed progressive osteomyelitis and bone destruction of the cuboid and base  of the fifth metatarsal. Last week he had an abscess over this same area this was removed. Culture was negative. ooWe are continuing with a total contact cast to the area on the third metatarsal head on the right and making some good progress here. The patient asked me again about renal transplant. He is not on the list because of the open wounds. 08/31/18; apparently the patient had an interruption in the [place] but he is now back on this at  dialysis. Apparently there was a misunderstanding and Dr. Crissie Figures orders. Due to have the MRI of the left foot tonight. ooThe area on the right foot continues to have callus thick skin and subcutaneous tissue around the wound edge that requires constant debridement however the wound is smaller we are using a total contact cast in this area. Alginate all wounds 09/07/18; without much surprise the MRI of his left foot showed osteomyelitis in the reminiscent of his fourth metatarsal but also the third metatarsal. She arrives in clinic today with the right foot and a total contact cast. There was purulent drainage coming out of the wound which I have cultured. Marked deterioration here with undermining widely around the wound orifice. Using pickups and a scalpel I remove callus and subcutaneous tissue from a substantial new opening. ooIn a similar fashion the area on the left lateral foot that was blistered last week I have opened this and remove skin and subcutaneous tissue from this area to expose a obvious new wound. I think there is extension and communication between all of this on the left foot. ooThe patient is on Fortaz at dialysis. Culture done of the right foot. We are clearly not making progress here. ooWe have made him an appointment with Dr. [name] of orthopedics. I think an amputation of the left leg may be discussed. I don't think there is anything that can be done with foot salvage. 09/14/18; culture from the right foot last time showed a very resistant MRSA. This is resistant to doxycycline. I'm going to try to get linezolid at least for a week. He does not have a appointment with Dr. [name] yet. 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 I'll send a message to Dr. [name] of infectious disease. I want the patient to see Dr. [name] to go over the pros and cons of an amputation which I think will be a BKA. The patient had a  question about whether this is curative or not. I told him that I thought it would be although spread of staph aureus infection is not unheard of. MRI of the right foot was done at the end of April 2019 did not show osteomyelitis. The patient last saw Dr. [name] on 9/3. I'll send Dr. [name] a message. I would really like him to weigh the pros and cons of a BKA on the left otherwise he'll probably need IV antibiotics and perhaps hyperbaric oxygen again. He has not had a good response to this in the past. His MRI earlier this month showed progressive damage in the remnants of the fourth and third metatarsal 09/21/2018; sees Dr. [name] of orthopedics next week to discuss a left BKA in response to the osteomyelitis in the left foot. Using silver alginate to all wound areas. He is completing the Zyvox I did put in for him after last culture showed MRSA 09/29/2018; patient saw Dr. [name] of orthopedics discussed the osteomyelitis in the left foot third  and fourth metatarsals. He did not recommend urgent surgery but certainly stated the only surgical option would be a BKA. He is communicating with Dr. Ninetta Lights. The problem here is the instability of the areas on his foot which constantly generate draining abscesses. I will communicate with Dr. Ninetta Lights about this. He has an appointment on 10/23 10/05/2018; patient sees Dr. Ninetta Lights on 10/23. I have sent him a secure message not ordered any additional antibiotics for now. Patient continues to have 2 open areas on the left plantar foot at the fifth metatarsal head and the lateral aspect of the foot. These have not changed all that much. The area on the right third met head still has thick callus and surrounding subcutaneous tissue we have been using silver alginate 10/12/2018; patient sees Dr. Ninetta Lights or colleague on 10/23. 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 The area on the right plantar foot  is just about closed. The superior wound on the left fifth metatarsal head is callused over but I am not sure if this is closed. The area below it is about the same. Considerable amount of callus on the lateral foot. We have been using silver alginate to the wounds 10/19/2018; the patient saw Dr. Ninetta Lights on 10/22. He wishes to try and continue to save the left foot. He has been given 6 weeks of oral ciprofloxacin. We continue to put a total contact cast on the right foot. The area over the fifth metatarsal head on the left is callused over/perhaps healed but I did not remove the callus to find out. He still has the wound on the left lateral midfoot requiring debridement. We have been using silver alginate to all wound areas 10/26/2018 ooOn the left foot our intake nurse noted some purulent drainage from the inferior wound [currently the only one that is not callused over] ooOn the right foot even though he is in total contact cast a considerable amount of thick black callus and surface eschar. On this side we have been using silver alginate under a total contact cast. oohe remains on ciprofloxacin as prescribed by Dr. Ninetta Lights 11/02/2018; culture from last week which is done of the probing area on the left midfoot wound showed MRSA "few". I am going to need to contact Dr. Ninetta Lights which I will do today I think he is going to need IV antibiotics again. The area on the left foot which was callused on the side is clearly separating today all of this was removed a copious amounts of callus and necrotic subcutaneous tissue. ooThe area on the right plantar foot actually remained healthy looking with a healthy granulated base. ooWe are using silver alginate to all wound areas 11/09/2018; unfortunately [name] 1 of the patient's wounds areas looks at all satisfactory. On the left he has considerable necrotic debris from the plantar wound laterally over the foot. I removed copious amounts of material  including callus skin and subcutaneous tissue. On the right foot he arrives with undermining and frankly purulent drainage. Specimen obtained for culture and debridement of the callused skin and subcutaneous tissue from around the circumference. Because of this I cannot put him back in a total contact cast but to be truthful we are really unfortunately not making a lot of progress I did put in a secure text message to Dr. Ninetta Lights wondering about the MRSA on the left foot. He suggested vancomycin although this is not started. I was left wondering  if he expected me to send this into dialysis. Has we have more purulence on the right side wait for that result before calling dialysis 11/16/2018; I called dialysis earlier this week to get the vancomycin started. I think he got the first dose on Tuesday. The patient tells me that he fell earlier this week twisting his left foot and ankle and he is swelling. He continues to not look well. He had blood cultures done at dialysis on Tuesday he is not been informed of the results. 12/07/2018; the patient was admitted to hospital from 11/22/2018 through 12/02/2018. He underwent a left BKA. Apparently had staph sepsis. In the meantime being off the right foot this is closed over. He follows up with Dr. Luiz Blare this afternoon Readmission: 01/10/19 upon evaluation today patient appears for follow-up in our clinic status post having had a left BKA on 11/20/18. Subsequent to this he actually had multiple falls in fact he tells me to following which calls the wound to be his apparently. He has been seeing Dr. Luiz Blare and his physician assistant in the interim. They actually did want him to come back for reevaluation to see if there's anything we can do for me wound care perspective the help this area to heal more appropriately. The patient states that he does not have a tremendous amount of pain which is good news. No fevers, chills, nausea, or vomiting noted at this time.  We have gotten approval from Dr. Luiz Blare office had [facility] for [place] to treat the patient for his stop wound. This is due to the fact that the patient was in the 90 day postop global. 1/21; the patient does not have an open wound on the right foot although he does have some pressure areas that will need to be padded when he is transferring. The dehisced surgical wound looks clean although there is some undermining of 1.5 cm superiorly. 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 This appears healthy but there is undermining. TheraSkin #1 2/11; TheraSkin #2 2/25; TheraSkin #3. Wound is measuring smaller 3/10; TheraSkin #4 wound is measuring smaller 3/24; TheraSkin #5 wound is measuring smaller and looks healthy. 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 He has no open area on the right foot although he does have some callused area over the original third metatarsal head wound. The third metatarsal is also subluxed on the right foot. I have warned him today that he cannot consider wearing a prosthesis for at least a month but he can go for measurements. He is going to need to keep the right foot padded is much as possible in his diabetic shoe indefinitely READMISSION 06/12/2019 Mr. Feggins is a type II diabetic on dialysis. He has been in this clinic multiple times with wounds on his bilateral lower extremities. 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 He also has a history of wounds on his right foot but tells me that everything is going well here. He actually came in here with his prosthesis walking with a cane. We were all really quite gratified to see this He states over the last several weeks he has had a painful area at the tip of the left third finger. His dialysis shunt in his is in  the left  upper arm and this particularly hurts during dialysis when his fingers get numb and there is a lot of pain in the tip of his left third finger. I believe he is seen vascular surgery. He had a Doppler done to evaluate for a dialysis steal syndrome. He is going for a banding procedure 1 week from today towards the left AV fistula. I think if that is unsuccessful they will be looking at creating a new shunt. He had a course of doxycycline when he which he finished about a week ago. He has been using Bactroban to the finger 6/25; the patient had his banding procedure and things seem to be going better. He is not having pain in his hand at dialysis. The area on the tip of his finger seems about closed. He did however have a paronychia I the last time I saw him. I have been advising him to use topical Bactroban washing the finger. Culture I did last time showed a few staph epidermidis which I was willing to dismiss as superficial skin contaminant. He arrives today with the finger wound looking better however the paronychia a seems worse 7/9; 2-week follow-up. He does not have an open wound on the tip of his third left finger. I thought he had an initial ischemic wound on the tip of the finger as well as a paronychia a medially. All of this appears to be closed. 8/6-Patient returns after 3 weeks for follow-up and has a right second met head plantar callus with a significant area of maceration hiding an ulcer ABI repeated today on right - 1.26 8/13-Patient returns at 1 week, the right second met head plantar wound appears to be slightly better, it is surrounded by the callus area we are using silver alginate 8/20; the patient came back to clinic 2 weeks ago with a wound on the right second metatarsal head. He apparently told me he developed callus in this area but also went away from his custom shoes to a pair of running shoes. Using silver alginate. He of course has the left BKA and prosthesis on  the left side. There might be much we can do to offload this although the patient tells me he has a wheelchair that he is using to try and stay off the wound is much as possible 8/27; right second metatarsal head. Still small punched out area with thick callus and subcutaneous tissue around the wound. We have been using silver collagen. This is always been an issue with this man's wound especially on this foot 9/3; right second met head. Still the same punched-out area with thick callus and subcutaneous tissue around the wound removing the circumference demonstrates repetitively undermining area. I have removed all the subcutaneous tissue associated with this. We have been using silver collagen 9/15; right second metatarsal head. Same punched-out thick callus and subcutaneous tissue around the wound area. Still requiring debridement we have been using silver collagen I changed back to silver alginate X-ray last time showed no evidence of osteomyelitis. Culture showed Klebsiella and he was started and Keflex apparently just 4 days ago 9/24; right second metatarsal head. He is completed the antibiotics I gave him for 10 days. Same punched-out thick callus and subcutaneous tissue around the area. Still requiring debridement. We have been using silver alginate. The area itself looks swollen and this could be just [place] that comes on this area with walking on a prosthesis on the left however I think he needs an MRI and I am  going to order that today. There is not an option to offload this further 10/1; right second plantar metatarsal head. MRI booked for October 6. Generally looking better today using silver alginate 10/8; right second plantar metatarsal head. MRI that was done on 10/6 was negative for osteomyelitis noted prior amputations of the second and fourth toes at the MTP. Prior amputation of the third toe to the base of the middle phalanx. Prior amputation of the fifth toe to the head of the  metatarsal. 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 He does not recall about hearing about this. He did not have any pain but then again he has reduced sensation from diabetic neuropathy 10/15; right second plantar metatarsal head. Still in the same condition callus nonviable subcutaneous tissue which cleans up nicely with debridement but reforms by the next week. The patient tells me that he is offloading this is much as he can including taking his wheelchair to dialysis. We have been using silver alginate, changed to silver collagen today 10/22; right second plantar metatarsal head. Wound measures smaller but still thick callus around this wound. Still requiring debridement 11/5 right second plantar metatarsal head. Less callus around the wound circumference but still requiring debridement. There is still some undermining which I cleaned out the wound looks healthy but still considerable punched out depth with a relatively small circumference to the wound there is no palpable bone no purulent drainage 11/12; right second plantar metatarsal head. Small wound with thick callus tissue around the wound and significant undermining. We have been using silver collagen after my continuous debridements of this area 11/19; patient arrives in today with purulent drainage coming out of the wound in the second third met head area on the right foot. Serosanguineous thick drainage. Specimen obtained for culture. 11/21/2019 on evaluation today patient appears to be doing well with regard to his foot ulcer. The good news is is culture came back negative for any bacteria there was no growth. The other good news is his x-ray also appears to be doing well. The foot is also measuring much better than what it was. Overall I am very pleased with how things seem to be progressing. 12/22; patient was in [facility] for several weeks due to the death of a family  member. He said he stayed off the wound using silver alginate. 01/03/2020. Patient has a small open area with roughly 0.3 cm of circumferential undermining. The orifice looks smaller. We have been using silver collagen. There is not a wound more aggressive way to offload this area as he has a prosthesis on the other leg 1/21; again the same small area is 2 weeks ago. We have been using silver collagen I change that to endoform today I really believe that this is an offloading issue 2/4; the area on the right third met head. We have been using endoform. 2/11 the area on the right third met head we have been using silver alginate. 2/25; the area on the right third met head. There is much less callus on this today. The patient states he is offloading this even more aggressively. As an example he is taking his wheelchair into dialysis on most days 3/4; right third metatarsal head. Again he has eschar over the surface of the wound which I removed with a #3 curette. Some subcutaneous debris. This has 0.8 cm of direct probing depth. I cannot feel any bone here. I did do a culture  the area 3/11; right third metatarsal head. Again an eschared area over the wound which almost makes this look superficial. Again debridement reveals a wound with probing depth probably not much different from last week there is no palpable bone no palpable purulence. The patient tells me that he is making every effort to offload this area properly. He is taking wheelchair into dialysis etc. There is no option for forefoot offloading or a total contact cast. We used Oasis #1 today 3/18; again callus and eschar over the wound surface. I remove this but still a probing wound although only 3 mm in depth this time. This is an improvement 3/25; again callus and eschar over the wound surface which I removed the wound is much deeper today at 0.7 cm. There is no palpable bone. Because of the appearance of this a culture was done. I am not  going to put the Oasis back in this. Concerned about underlying infection. Notable for the fact that he is just finished doxycycline for the last go round of this 3/30; still 0.7 cm in depth which is disappointing. Culture I gave last time showed a few Staph aureus which is methicillin susceptible. This should have been taken care of by the recent course of doxycycline I gave him. We are using silver alginate 4/6; almost 1 cm in depth. We have been using silver alginate with underlying Bactroban. 4/15; about 1 cm in depth still. There is no palpable bone but there is undermining thick surface again as usual. Our intake nurse noted what looked to be purulent drainage I suppose that could have been Bactroban but I did a culture for CandS 4/20; depth down 0.7 cm. Culture from last time was negative we have been using Bactroban and silver alginate 4/29; depth down 0.5 cm. Using silver alginate. He reports some drainage 5/6; comes in with the wound worse. Wound is deeper and tunnels. Measures 0.9 depth and an undermining area of 1.8 cm. 5/18; there is a change the dressing last week. Still using Sorbact hydrogel. What I can see of the base of the wound looks better. 6/1. Not much change. I changed him to endoform today. He has 1.1 cm in depth no palpable bone 6/8; again a small punched out wound with significant undermining. Culture I did last week was negative. Today he had some purulent drainage perhaps mixed with some retained endoform. 6/22; MRI report below ooIMPRESSION: Skin ulceration on the plantar surface of the foot at the level of the head of the second metatarsal. Mild marrow edema in the head of the second metatarsal could be reactive or due to osteomyelitis. Thin fluid collection surrounding the head and neck of the second metatarsal appears to communicate with the patient's skin wound and is worrisome for a septic collection. Advanced first MTP osteoarthritis. Prior amputations as  noted above. Electronically Signed By: Drusilla Kanner M.D. On: 06/16/2020 11:51 PCR culture I did last time I believe showed peptostreptococcus. There was no comment on any other gram-negative or gram-positive organisms. I gave him a course of Flagyl which I think he is finishing up. We are not making any progress with the wound over the right second/third metatarsal head. We are using silver alginate putting him in kerlix Coban 6/29; has his appointment with Dr. Luiz Blare of orthopedics next Tuesday I think we see him on the same day. He was in for a nurse visit today. Nurse noted undermining of skin and callus so I was asked to look at this.  We have been using silver alginate and kerlix Coban 7/6; patient saw Dr. Luiz Blare PA today will see Dr. Luiz Blare on Tuesday. The major question is does he needs surgery to clean out the area in question culture question resect any involved bone etc. Otherwise he will need to see infectious disease for broad-spectrum antibiotics given at dialysis Objective Constitutional Patient is hypertensive.. Pulse regular and within target range for patient.Marland Kitchen Respirations regular, non-labored and within target range.. Temperature is normal and within the target range for the patient.Marland Kitchen Appears in no distress. Vitals Time Taken: 3:03 PM, Height: 73 in, Source: Stated, Weight: 202 lbs, Source: Stated, BMI: 26.6, Temperature: 98.9 F, Pulse: 90 bpm, Respiratory Rate: 18 breaths/min, Blood Pressure: 99/66 mmHg, Capillary Blood Glucose: 123 mg/dl. Cardiovascular Pedal pulses palpable on the right. General Notes: Wound exam; small punched out area cleaner looking wound surface. This does not probe deeply. Integumentary (Hair, Skin) Wound #41 status is Open. Original cause of wound was Gradually Appeared. The wound is located on the Right Metatarsal head second. The wound measures 0.5cm length x 0.5cm width x 0.6cm depth; 0.196cm^2 area and 0.118cm^3 volume. There is Fat Layer  (Subcutaneous Tissue) Exposed exposed. There is no tunneling noted, however, there is undermining starting at 9:00 and ending at 12:00 with a maximum distance of 0.4cm. There is a small amount of serosanguineous drainage noted. The wound margin is thickened. There is medium (34-66%) red granulation within the wound bed. There is a medium (34-66%) amount of necrotic tissue within the wound bed including Adherent Slough. 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 Dressing Change Frequency: Wound #41 Right Metatarsal head second: Do not change entire dressing for one week. Skin Barriers/Peri-Wound Care: Moisturizing lotion Wound Cleansing: May shower with protection. - patient to use a cast protector. Primary Wound Dressing: Wound #41 Right Metatarsal head second: Calcium Alginate with Silver Secondary Dressing: Wound #41 Right Metatarsal head second: Dry Gauze Other: - felt to offload periwound. Edema Control: Kerlix and Coban - Right Lower Extremity Avoid standing for long periods of time Elevate legs to the level of the heart or above for 30 minutes daily and/or when sitting, a frequency of: - throughout the day Additional Orders / Instructions: Other: - minimize walking and standing on foot to aid in wound healing and callus buildup. 1. Silver alginate to continue 2. Paradoxically the wound actually looks quite a bit better today 3. I am looking for Dr. Luiz Blare to give me guidance about whether the patient needs surgery on this area. This would benefit with intraoperative cultures and cleanout and ultimately give [place] a target to send to infectious disease. I also wonder whether he needs bone resection in this  area. Electronic Signature(s) Signed: 07/01/2020 5:09:59 PM By: Baltazar Najjar MD Entered By: Baltazar Najjar on 07/01/2020 15:52:50 -------------------------------------------------------------------------------- SuperBill Details Patient Name: Date of Service: Sherrell Puller 07/01/2020 Medical Record Number: [id number] Patient Account Number: [id number] Date of Birth/Sex: Treating RN: 16-Feb-1951 (69 y.o. Elizebeth Koller Primary Care Provider: Knox Royalty Other Clinician: Referring Provider: Treating Provider/Extender: Marlyce Huge in Treatment: 55 Diagnosis Coding ICD-10 Codes Code Description [phone] Type 2 diabetes mellitus with foot ulcer L97.512 Non-pressure chronic ulcer of other part of right foot with fat layer exposed I70.298 Other  atherosclerosis of native arteries of extremities, other extremity L03.115 Cellulitis of right lower limb Physician Procedures : CPT4 Code Description Modifier [id number] - WC PHYS LEVEL 3 - EST PT ICD-10 Diagnosis Description E11.621 Type 2 diabetes mellitus with foot ulcer L97.512 Non-pressure chronic ulcer of other part of right foot with fat layer exposed Quantity: 1 Electronic Signature(s) Signed: 07/01/2020 5:09:59 PM By: Linton Ham MD Entered By: Linton Ham on 07/01/2020 15:53:11

## 2020-07-08 ENCOUNTER — Encounter (HOSPITAL_BASED_OUTPATIENT_CLINIC_OR_DEPARTMENT_OTHER): Payer: Medicare Other | Admitting: Internal Medicine

## 2020-07-10 NOTE — Progress Notes (Signed)
[name], [name] ([id number]) Visit Report for 07/08/2020 Debridement Details Patient Name: Date of Service: [name] 07/08/2020 1:00 PM Medical Record Number: [id number] Patient Account Number: [id number] Date of Birth/Sex: Treating RN: 01/05/1951 (68 y.o. [name]) Carlene Coria Primary Care Provider: [name] Other Clinician: Referring Provider: Treating Provider/Extender: [name] in Treatment: 56 Debridement Performed for Assessment: Wound #41 Right Metatarsal head second Performed By: Physician Ricard Dillon., MD Debridement Type: Debridement Severity of Tissue Pre Debridement: Fat layer exposed Level of Consciousness (Pre-procedure): Awake and Alert Pre-procedure Verification/Time Out Yes - 14:18 Taken: Start Time: 14:18 Pain Control: Lidocaine 5% topical ointment T Area Debrided (L x W): otal 0.3 (cm) x 0.4 (cm) = 0.12 (cm) Tissue and other material debrided: Viable, Non-Viable, Slough, Subcutaneous, Skin: Dermis , Skin: Epidermis, Slough Level: Skin/Subcutaneous Tissue Debridement Description: Excisional Instrument: Curette Bleeding: Moderate Hemostasis Achieved: Pressure End Time: 14:21 Procedural Pain: 0 Post Procedural Pain: 0 Response to Treatment: Procedure was tolerated well Level of Consciousness (Post- Awake and Alert procedure): Post Debridement Measurements of Total Wound Length: (cm) 0.3 Width: (cm) 0.4 Depth: (cm) 0.7 Volume: (cm) 0.066 Character of Wound/Ulcer Post Debridement: Improved Severity of Tissue Post Debridement: Fat layer exposed Post Procedure Diagnosis Same as Pre-procedure Electronic Signature(s) Signed: 07/08/2020 5:57:23 PM By: Carlene Coria RN Signed: 07/10/2020 5:51:19 PM By: Linton Ham MD Entered By: Carlene Coria on 07/08/2020 17:18:02 -------------------------------------------------------------------------------- HPI Details Patient Name: Date of Service: [name] Shines B. 07/08/2020  1:00 PM Medical Record Number: [id number] Patient Account Number: [id number] Date of Birth/Sex: Treating RN: 05/24/1951 (68 y.o. [name] Primary Care Provider: [name] Other Clinician: Referring Provider: Treating Provider/Extender: [name] in Treatment: 56 History of Present Illness Location: Patient presents with a wound to bilateral feet. Quality: Patient reports experiencing essentially no pain. Severity: Mildly severe wound with no evidence of infection Duration: Patient has had the wound for greater than 2 weeks prior to presenting for treatment HPI Description: The patient is a pleasant 68 yrs old bm here for evaluation of ulcers on the plantar aspect of both feet. 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 He has a left arm graft for access. He has been trying to stay off his feet for weeks but does not seem to have any improvement in the wound. He has been seeing someone at the foot center and was referred to the Wound Care center for further evaluation. 12/30/15 the patient has 3 wounds one over the right first metatarsal head and 2 on the left foot at the left fifth and lleft first metatarsal head. All of these look relatively similar. The one over the left fifth probe to bone I could not prove that any of the others did. I note his MRI in November that did not show osteomyelitis. His peripheral pulses seem robust. All of these underwent surgical debridement to remove callus nonviable skin and subcutaneous tissue 01/06/16; the patient had his sutures removed from the right fifth ray amputation. There may be a small open part of this superiorly but otherwise the incision looks good. Areas over his right first, left first and left fifth metatarsal head all underwent surgical debridement as a varying degree of callus, skin and nonviable subcutaneous tissue. The area that is most worrisome is the  right fifth metatarsal head which has a wound probes precariously close to bone. There is no purulent drainage or erythema 01/13/16; I'm  not exactly sure of the status of the right fifth ray amputation site however he follows with Dr. [name] later this week. The area over his right first plantar metatarsal head, left first and fifth plantar metatarsal head are all in the same status. Thick circumferential callus, nonviable subcutaneous tissue. Culture of the left fifth did not culture last week 01/20/16; all of the patient's wounds appear and roughly the same state although his amputation site on the right lateral foot looks better. His wounds over the right first, left first and left fifth metatarsal heads all underwent difficult surgical debridement removing circumferential callus nonviable skin and subcutaneous tissue. There is no overt evidence of infection in these areas. MRI at the end of December of the left foot did not show osteomyelitis, right foot showed osteomyelitis of the right fifth digit he is now status post amputation. 01/27/16 the patient's wounds over his plantar first and fifth metatarsal heads on the left all appear better having been started on a total contact cast last week. They were debridement of circumferential callus and nonviable subcutaneous tissue as was the wound over the first metatarsal head. His surgical incision on the right also had some light surface debridement done. 03/23/2016 -- the patient was doing really well and most of his wounds had almost completely healed but now he came back today with a history of having a discharge from the area of his right foot on the plantar aspect and also between his first and second toe. He also has had some discharge from the left foot. Addendum: I spoke to the PA [name] at the dialysis center whose fax number is [phone]. We discussed the infection the patient has and she will put the patient on vancomycin and  Fortaz until the final culture report is back. We will fax this as soon as available. 03/30/2016 -- left foot x-ray IMPRESSION:No definitive osteomyelitis noted. X-ray of the right foot -- IMPRESSION: 1. Soft tissue swelling. Prior amputation right fifth digit. No acute or focal bony abnormality identified. If osteomyelitis remains a clinical concern, MRI can be obtained. 2. Peripheral vascular disease. His culture reports have grown an MSSA -- and we will fax this report to his hemodialysis center. He was called in a prescription of oral doxycycline but I have told her not to fill this in as he is already on IV antibiotics. 04/06/2016 -- a few days ago, I spoke to the hemodialysis center nurse who had stopped the IV antibiotics and he was given a prescription for doxycycline 100 mg by mouth twice a day for a week and he is on this at the present time. 05/11/2016 -- he has recently seen his PCP this week and his hemoglobin A1c was 7. He is working on his paperwork to get his orthotic shoes. 05/25/2016 -- -- x-ray of the left foot IMPRESSION:No acute bony abnormality. No radiographic changes of acute osteomyelitis. No change since prior study. X-ray of the right foot -- IMPRESSION: Postsurgical changes are seen involving the fifth toe. No evidence of acute osteomyelitis. he developed a large blister on the medial part of his right foot and this opened out and drain fluid. 06/01/2016 -- he has his MRI to be done this afternoon. 06/15/2016 -- MRI of the left forefoot without contrast shows 2 separate regions of cutaneous and subcutaneous edema and possible ulceration and blistering along the ball of the foot. No obvious osteomyelitis identified. MRI of the right foot showed cutaneous and subcutaneous thickening plantar to  the first digit sesamoid with an ulcer crater but no underlying osteomyelitis is identified. 06/22/2016 -- the right foot plantar ulcer has been draining a lot of seropurulent  material for the last few days. 06/30/2016 -- spoke to the dialysis center and I believe I spoke to Parks a PA at the center who discussed with me and agreed to putting Legrand Como on vancomycin until his cultures arrive. On review of his culture report no WBCs were seen or no organisms were seen and the culture was reincubated for better growth. The final report is back and there were no predominant growth including Streptococcus or Staphylococcus. Clinically though he has a lot of drainage from both wounds a lot of undermining and there is further blebs on the left foot towards the interspace between his first and second toe. 08/10/2016 -- the culture from the right foot showed normal skin flora and there was no Staphylococcus aureus was group A streptococcus isolated. 09/21/2016 -- -- MRI of the right foot was done on 09/13/2016 - IMPRESSION: Findings most consistent with acute osteomyelitis throughout the great toe, sesamoid bones and plantar aspect of the head of the first metatarsal. Fluid in the sheath of the flexor tendon of the great toe could be sympathetic but is worrisome for septic tenosynovitis. First MTP joint effusion worrisome for septic joint. He was admitted to the hospital on 09/11/2016 and treated for a fever with vancomycin and cefepime. He was seen by Dr. Bobby Rumpf of infectious disease who recommended 6 weeks treatment with vancomycin and ceftazidime with his hemodialysis and to continue to see as in the wound clinic. Vascular consult was pending. The patient was discharged home on 09/14/2016 and he would continue with IV antibiotics for 6 weeks. 09/28/16 wound appears reasonably healthy. Continuing with total contact cast 10/05/16 wound is smaller and looking healthy. Continue with total contact cast. He continues on IV antibiotics 10/12/2016 -- he has developed a new wound on the dorsal aspect of his left big toe and this is a superficial injury with no surrounding  cellulitis. He has completed 30 days of IV antibiotics and is now ready to start his hyperbaric oxygen therapy as per his insurance company's recommendation. 10/19/2016 -- after the cast was removed on the right side he has got good resolution of his ulceration on the right plantar foot. he has a new wound on the left plantar foot in the region of his fourth metatarsal and this will need sharp debridement. 10/26/2016 -- Xray of the right foot complete: IMPRESSION: Changes consistent with osteomyelitis involving the head of the first metatarsal and base of the first proximal phalanx. The sesamoid bones are also likely involved given their positioning. 11/02/2016 -- there still awaiting insurance clearance for his hyperbaric oxygen therapy and hopefully he will begin treatment soon. 11/09/2016 -- started with hyperbaric oxygen therapy and had some barotrauma to the right ear and this was seen by ENT who was prescribed Afrin drops and would probably continue with HBO and he is scheduled for myringotomy tubes on Friday 11/16/2016 -- he had his myringotomy tubes placed on Friday and has been doing much better after that with some fluid draining out after hyperbaric treatment today. The pain was much better. 11/30/2016 -- over the last 2 days he noticed a swelling and change of color of his right second toe and this had been draining minimal fluid. 12/07/2016 -- x-ray of the right foot -- IMPRESSION:1. Progressive ulceration at the distal aspect of the second digit with  significant soft tissue swelling and osseous changes in the distal phalanx compatible with osteomyelitis. 2. Chronic osteomyelitis at the first MTP joint. 3. Ulcerations at the second and third toes as well without definite osseous changes. Osteomyelitis is not excluded. 4. Fifth digit amputation. On 12/01/2016 I spoke to Dr. Bobby Rumpf, the infectious disease specialist, who kindly agreed to treat this with IV antibiotics and he would  call in the order to the dialysis center and this has been discussed in detail with the patient who will make the appropriate arrangements. The patient will also book an appointment as soon as possible to see Dr. [name] in the office. Of note the patient has not been on antibiotics this entire week as the dialysis center did not receive any orders from Dr. [name]. I got in touch with Dr. [name] who tells me the patient has an appointment to see him this coming Wednesday. 12/14/2016 -- the patient is on ceftazidime and vancomycin during his dialysis, and I understand this was put on by his nephrologist Dr. Raliegh Ip possibly after speaking with infectious disease Dr. [name] 12/23/16; patient was on my schedule today for a wound evaluation as he had difficulties with his schedule earlier this week. I note that he is on vancomycin and ceftazidine at dialysis. In spite of this he arrives today with a new wound on the base of the right second toe this easily probes to bone. The known wound at the tip of the second toe With this area. He also has a superficial area on the medial aspect of the third toe on the side of the DIP. This does not appear to have much depth. The area on the plantar left foot is a deep area but did not probe the bone 12/28/16 -- he has brought in some lab work and the most recent labs done showed a hemoglobin of 12.1 hematocrit of 36.3, neutrophils of 51% WBC count of 5.6, BN of 53, albumin of 4.1 globulin of 3.7, vancomycin 13 g per mL. 01/04/2017 -- he saw Dr. [name] who has recommended a amputation of the right second toe and he is awaiting this date. He sees Dr. Bobby Rumpf of infectious disease tomorrow. The bone culture taken on 12/28/2016 had no growth in 2 days. 01/11/2017 -- was seen by Dr. Bobby Rumpf regarding the management and has recommended a eval by vascular surgeons. He recommended to continue the antibiotics during his hemodialysis. Xray of the left foot --  IMPRESSION: No acute fracture, dislocation, or osseous erosion identified. 01/18/2017 -- he has his vascular workup later today and he is going to have his right foot second toe amputation this coming Friday by Dr. [name]. We have also put in for a another 30 treatments with hyperbaric oxygen therapy 01/25/2017 -- I have reviewed Dr. Donnetta Hutching is vascular report from last week where he reviewed him and thought his vascular function was good enough to heal his amputation site and no further tests were recommended. He also had his orthopedic related to surgery which is still pending the notes and we will review these next week. 02/01/2017 -- the operative remote of Dr. 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 02/08/2017 --  he was seen by Dr. [name] on 02/03/2017 -- patient is on [place]. after a thorough review he had recommended to continue with antibiotics during hemodialysis. The patient was seen by Dr. [name], but I do not find any follow-up note on the electronic medical record. he has removed the dressing over the right foot to remove the sutures and asked him to see him back in a month's time 02/22/2017 -- patient has been febrile and has been having symptoms of the upper respiratory tract infection but has not been checked for the flu and it's been over 4 days now. He says he is feeling better today. Some drainage between his left first and second toe and this needed to be looked at 03/08/2017 -- was seen by Dr. Bobby Rumpf on 03/07/2017 after review he will stop his antibiotics and see him back in a month to see if he is feeling  well. 03/15/2017 -- he has completed his course of hyperbaric oxygen therapy and is doing fine with his health otherwise. 03/22/2017 -- his nutritionist at the dialysis center has recommended a protein supplement to help build his collagen and we will prescribe this for him when he has the details 05/03/2017 -- he recently noticed the area on the plantar aspect of his fifth metatarsal head which opened out and had minimal drainage. 05/10/2017 -- -- x-ray of the left foot -- IMPRESSION:1. No convincing conventional radiographic evidence of active osteomyelitis.2. Active soft tissue ulceration at the tip of the fifth digit, and at the lateral aspect of the foot adjacent the base of the fifth metatarsal. 3. Surgical changes of prior fourth toe amputation. 4. 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 5. Small vessel atherosclerotic vascular calcifications. 6. 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 ======= Readmission after 5 weeks: 06/15/2017 -- the patient returns after 5 weeks having had an MRI done on 05/17/2017 MRI of the left foot without contrast showed soft tissue ulcer overlying the base of the fifth metatarsal. 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 No drainable fluid collection to suggest an abscess. He was in hospital between 05/16/2017 and 05/25/2017 -- and on discharge was asked to follow-up with Dr. [name] and Dr. [name]. He was treated 2 weeks post discharge with vancomycin plus ceftriaxone with hemodialysis and oral metronidazole 500 mg 3 times a day. 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 last hemoglobin A1c was 6.7% He had a postoperative MR of the foot on 05/23/2017 -- which showed no new areas of cortical bone loss, edema or soft tissue ulceration to  suggest osteomyelitis. the patient has completed his course of IV antibiotics during dialysis and is to see the infectious disease doctor tomorrow. Dr. [name] had seen him after suture removal and asked him to keep the wound with a dry dressing 06/21/2017 -- he was seen by Dr. Bobby Rumpf of infectious disease on 06/16/2017 -- he stopped his Flagyl and will see him back in 6 weeks. He was asked to follow-up with Dr. [name] and with the wound clinic clinic. 07/05/17; bone biopsy from last time showed acute osteomyelitis. This is from the reminiscent in the left fifth metatarsal. Culture result is apparently still pending [holding for anaerobe}. From my understanding in this case this is been a progressive necrotic wound which is deteriorated markedly over the  last 3 weeks since he returned here. He now has a large area of exposed bone which was biopsied and cultured last week. Dr. Graylon Good has put him on vancomycin and Fortaz during his hemodialysis and Flagyl orally. He is to see Dr. [name] next week 07/19/2017 -- the patient was reviewed by Dr. [name] of orthopedics who reviewed the case in detail and agreed with the plan to continue with IV antibiotics, aggressive wound care and hyperbaric oxygen therapy. He would see him back in 3 weeks' time 08/09/2017 -- saw Dr. Bobby Rumpf on 08/03/2017 -he was restarted on his antibiotics for 6 weeks which included vanco, ceftaz and Flagyl. he recommended continuing his antibiotics for 6 weeks and reevaluate his completion date. He would continue with wound care and hyperbaric oxygen therapy. 08/16/2017 -- I understand he will be completing his 6 weeks of antibiotics sometime later this week. 09/19/2017 -- he has been without his wound VAC for the last week due to lack of supplies. He has been packing his wound with silver alginate. 10/04/2017 -- he has an appointment with Dr. [name] tomorrow and hence we will not apply the wound VAC after his dressing  changes today. 10/11/2017 -- he was seen by Dr. Bobby Rumpf on 10/05/2017, noted that the patient was currently off antibiotics, and after thorough review he recommended he follow-up with the wound care as per plan and no further antibiotics at this point. 11/29/17; patient is arrived for the wound on his left lateral foot.Marland Kitchen He is completed IV antibiotics and 2 rounds of hyperbaric oxygen for treatment of underlying osteomyelitis. He arrives today with a surface on most of the wound area however our intake nurse noted drainage from the superior aspect. This was brought to my attention. 12/13/2017 -- the right plantar foot had a large bleb and once the bleb was opened out a large callus and subcutaneous debris was removed and he has a plantar ulcer near the fourth metatarsal head 12/28/17 on evaluation today patient appears to be doing acutely worse in regard to his left foot. The wound which has been appearing to do better as now open up more deeply there is bone palpable at the base of the wound unfortunately. He tells me that this feels "like it did when he had osteomyelitis previously" he also noted that his second toe on the left foot appears to be doing worse and is swollen there does appear to be some fluid collected underneath. His right foot plantar ulcer appears to be doing somewhat better at this point and there really is no complication at the site currently. No fevers, chills, nausea, or vomiting noted at this time. Patient states that he normally has no pain at this site however. T oday he is having significant pain. 01/03/18; culture done last week showed methicillin sensitive staph aureus and group B strep. He was on Septra however he arrives today with a fever of 101. He has a functional dialysis shunt in his left arm but has had no pain here. No cough. He still makes some urine no dysuria he did have abdominal pain nausea and diarrhea over the weekend but is not had any diarrhea since  yesterday. Otherwise he has no specific complaints 01/05/18; the patient returns today in follow-up for his presentation from 1/8. At that point he was febrile. I gave him some Levaquin adjusted for his dialysis status. He tells me the fever broke that night and it is not likely that this was actually a wound  infection. He is gone on to have an MRI of the left foot; this showed interval development of an abnormal signal from the base of the third and fourth metatarsal and in the cuboid reminiscent consistent with osteomyelitis. There is no mention of the left second toe I think which was a concern when it was ordered. The patient is taking his Levaquin 01/10/18; the patient has completed his antibiotics today. The area on the left lateral foot is smaller but it still probes easily to bone. He has underlying osteomyelitis here and sees Dr. Megan Salon of infectious disease this week The area on the right third plantar metatarsal head still requires debridement not much change in dimensions He has a new wound on the medial tip of his right third toe again this probes to bone. He only noticed this 2 days ago 01/17/18; the patient saw Dr. [name] last week and he is back on Vanco and Fortaz at dialysis. 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 We are previously seeing him for a wound also on the base of the fourth med head on the right. X-ray of this area did not show osteomyelitis in relation to a new wound on the tip of his right third toe. Again this probes to bone. Finally his second toe on the left which didn't really show anything on the MRI at least no report appears to have a separated cutaneous area which I think is going to come right off the tip of his toe and leave exposed bone. Whether this is infectious or ischemic I am not clear Culture I did from the third toe last week which was  his new wound was negative. Plain x-ray on the right foot did not show osteomyelitis in the toes 01/24/18; the patient is going went to see Dr. [name]. He is going to have a amputation of the left second and right third toe although paradoxically the left second toe looks better than last week. We have treating a deep probing wound on the left lateral foot and the area over the third metatarsal head on the right foot. 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 Also a debridement including bone of the left lateral foot and a closure which is still sutured. This is a bit surprising. Also apparent debridement of the base of the right third toe wound. 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 I'm not sure exactly where we are in the frame of things here. The wound on the left lateral foot is still sutured also the amputation sites of the left second and right third toe. He is been using Xeroform to the sutured areas silver alginate to the right foot 02/14/18; he continues on Rocephin at dialysis for osteomyelitis. I still don't have a good sense of where we are in the treatment duration. The wounds on the left lateral foot had the sutures removed and this clearly still probes deeply. We debrided the area with a #5 curet. We'll use silver alginate to the wound the area over the right third met head also required debridement of callus skin and subcutaneous tissue and necrotic debris over the  wound surface. This tunnels superiorly but I did not unroofed this today. The areas on the tip of his toes have a crusted surface eschar I did not debridement this either. 02/21/18; he continues on Rocephin at dialysis for osteomyelitis. I don't have a good sense of where we are and the  treatment duration. The wounds on the left lateral foot is callused over on presentation but requires debridement. The area on the right third med head plantar aspect actually is measuring smaller 02/28/18;patient is on Vanco and Fortaz at dialysis, I'm not sure why I thought this was Rocephin above unless it is been changed. I don't have a good sense of time frame here. Using silver collagen to the area on the left lateral foot and right third med head plantar foot 03/07/18; patient is completing bank and Fortaz at dialysis soon. He is using Silver collagen to the area on the left lateral foot and the right third metatarsal head. He has a smaller superficial wound distal to the left lateral foot wound. 03/14/18-he is here in follow-up evaluation for multiple ulcerations to his bilateral feet. He presents with a new ulceration to the plantar aspect of the left foot underneath the blister, this was deroofed to reveal a partial thickness ulcer. He is voicing no complaints or concerns, tolerated dialysis yesterday. We will continue with same treatment plan and he will follow-up next week 03/21/18; the patient has a area on the lateral left foot which still has a small probing area. The overall surface area of the wound is better. He presented with a new ulceration on the left foot plantar fifth metatarsal head last week. The area on the right third metatarsal head appears smaller.using silver alginate to all wound areas. The patient is changing the dressing himself. He is using a Darco forefoot offloading are on the right and a healing sandal on the left 03/28/18; original wound left lateral foot. Still nowhere close to looking like a heeling surface secondary wound on the left lateral foot at the base of his question fifth metatarsal which was new last week Right third metatarsal head. We have been using silver alginate all wounds 04/04/18; the original wound on the left lateral foot again heavy callus  surrounding thick nonviable subcutaneous tissue all requiring debridement. The new wound from 2 weeks ago just near this at the base of the fifth metatarsal head still looks about the same Unfortunately there is been deterioration in the third medical head wound on the right which now probes to bone. I must say this was a superficial wound at one point in time and I really don't have a good frame of reference year. I'll have to go back to look through records about what we know about the right foot. 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 He is also been to see Dr. [name]. Somewhere in here as somebody is ordered a VASCULAR evaluation which was done on 03/22/18. On the right is anterior tibial artery was monophasic triphasic at the posterior tibial artery. On the left anterior tibial artery monophasic posterior tibial artery monophasic. ABI in the right was 1.43 on the left 1.21. TBIs on the right at 0.41 and on the left at 0.32. A vascular consult was recommended and I think has been arranged. 04/11/18; Mr. Akhtar has not had an MRI of the right foot since 2017. Recent x-ray of the right foot done in  January and February was negative. However he has had a major deterioration in the wound over the third met head. He is completed antibiotics last week at dialysis [place]. I think he is going to see vascular in follow-up. The areas on the left lateral foot and left plantar fifth metatarsal head both look satisfactory. I debrided both of these areas although the tissue here looks good. The area on the left lateral foot once probe to bone it certainly does not do that now 04/18/18; MRI of the right foot is on Thursday. He has what looks to be serosanguineous purulent drainage coming out of the wound over the right third metatarsal head today. He completed antibiotics bank and Fortaz 2 weeks ago at  dialysis. He has a vascular follow-up with regards to his arterial insufficiency although I don't exactly see when that is booked. The areas on the left foot including the lateral left foot and the plantar left fifth metatarsal head look about the same. 04/25/18-He is here in follow-up evaluation for bilateral foot ulcers. MRI obtained was negative for osteomyelitis. Wound culture was negative. We will continue with same treatment plan he'll follow-up next week 05/02/18; the right plantar foot wound over the third metatarsal head actually looks better than when I last saw this. His MRI was negative for osteomyelitis wound culture was negative. On the left plantar foot both wounds on the plantar fifth metatarsal head and on the lateral foot both are covered in a very hard circumferential callus. 05/09/18; right plantar foot wound over the third metatarsal head stable from last week. Left plantar foot wound over the fifth metatarsal head also stable but with callus around both wound areas The area on the left lateral foot had thick callus over a surface and I had some thoughts about leaving this intact however it felt boggy. We've been using silver alginate all wounds 05/16/18; since the patient was last here he was hospitalized from 5/15 through 5/19. 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 This grew MRSA. He was placed on vancomycin.MRI of the left foot suggested "progressive" osteomyelitis and bone destruction of the cuboid and the base of the fifth metatarsal. Stable erosive changes of the base of the second metatarsal. I'm wondering if they're aware that he had surgery and debridement in the area of the underlying bone previously by Dr. [name]. In any case, He was also revascularized with an angioplasty and stenting of the left tibial peroneal trunk and angioplasty of the left posterior tibial  artery. He is on vancomycin at dialysis. He has a 2 week follow-up with infectious disease. He has vascular surgery follow-up. He was started on Plavix. 05/23/18; the patient's wound on the lateral left foot at the level of the fifth metatarsal head and the plantar wound on the plantar fifth metatarsal head both look better. The area on the right third metatarsal head still has depth and undermining. We've been using silver alginate. He is on vancomycin. He has been revascularized on the left 05/30/18; he continues on vancomycin at dialysis. Revascularized on the left. The area on the left lateral foot is just about closed. Unfortunately the area over the plantar fifth metatarsal head undermining superiorly as does the area over the right third metatarsal head. Both of these significantly deteriorated from last week. He has appointments next week with infectious disease and orthopedics I delayed putting  on a cast on the left until those appointments which therefore we made we'll bring him in on Friday the 14th with the idea of a cast on the left foot 06/06/18; he continues on vancomycin at dialysis. Revascularized on the left. He arrives today with a ballotable swelling just above the area on the left lateral foot where his previous wound was. He has no pain but he is reasonably insensate. The difficult areas on the plantar left fifth metatarsal head looks stable whereas the area on the right third plantar metatarsal head about the same as last week there is undermining here although I did not unroofed this today. He required a considerable debridement of the swelling on the left lateral foot area and I unroofed an abscess with a copious amount of brown purulent material which I obtained for culture. I don't believe during his recent hospitalization he had any further imaging although I need to review this. Previous cultures from this area done in this clinic showed MRSA, I would be surprised if this is  not what this is currently even though he is on vancomycin. 06/13/18; he continues on vancomycin at dialysis however he finishes this on Sunday and then graduates the doxycycline previously prescribed by Dr. [name]. The abscess site on the lateral foot that I unroofed last time grew moderate amounts of methicillin-resistant staph aureus that is both vancomycin and tetracycline sensitive so we should be okay from that regard. He arrives today in clinic with a connection between the abscess site and the area on the lateral foot. We've been using silver alginate to all his wound areas. The right third metatarsal head wound is measuring smaller. Using silver alginate on both wound areas 06/20/18; transitioning to doxycycline prescribed by Dr. [name]. The abscess site on the lateral foot that I unroofed 2 weeks ago grew MRSA. That area has largely closed down although the lateral part of his wound on the fifth metatarsal head still probes to bone. I suspect all of this was connected. On the right third metatarsal head smaller looking wound but surrounded by nonviable tissue that once again requires debridement using silver alginate on both areas 06/27/18; on doxycycline 100 twice a day. The abscess on the lateral foot has closed down. Although he still has the wound on the left fifth metatarsal head that extends towards the lateral part of the foot. The most lateral part of this wound probes to bone Right third metatarsal head still a deep probing wound with undermining. Nevertheless I elected not to debridement this this week If everything is copious static next week on the left I'm going to attempt a total contact cast 07/04/18; he is tolerating his doxycycline. The abscess on the left lateral foot is closed down although there is still a deep wound here that probes to bone. We will use silver collagen under the total contact cast Silver collagen to the deep wound over the right third metatarsal head is  well 07/11/18; he is tolerating doxycycline. The left lateral fifth plantar metatarsal head still probes deeply but I could not probe any bone. We've been using silver collagen and this will be the second week under the total contact cast Silver collagen to deep wound over the right third metatarsal head as well 07/18/18; he is still taking doxycycline. The left lateral fifth plantar metatarsal this week probes deeply with a large amount of exposed bone. Quite a deterioration. He required extensive debridement. Specimens of bone for pathology and CNS obtained Also considerable  debridement on the right third metatarsal head 07/25/18; bone for pathology last week from the lateral left foot showed osteomyelitis. CandS of the bone Enterobacter. And Klebsiella. He is now on ciprofloxacin and the doxycycline is stopped [Dr. [name] of infectious disease] area He has an appointment with Dr. [name] tomorrow I'm going to leave the total contact cast off for this week. May wish to try to reapply that next week or the week after depending on the wound bed looks. I'm not sure if there is an operative option here, previously is followed with Dr. [name] 08/01/18 on evaluation today patient presents for reevaluation. He has been seen by infectious disease, Dr. [name], and he has placed the patient back on Ceftaz currently to be given to him at dialysis three times a week. 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 Fortunately there does not appear to be the evidence of infection at this point in time. Overall the patient has been tolerating the dressing changes without complication. We are no longer due lives in the cast of left foot secondary to the infection obviously. 08/10/18 on evaluation today patient appears to be doing rather well all things considered in regard to his left plantar foot ulcer. He does have a significant callous on the  lateral portion of his left foot he wonders if I can help clean that away to some degree today. Fortunately he is having no evidence of infection at this time which is good news. No fevers chills noted. With that being said his right plantar foot actually does have a significant callous buildup around the wound opening this seems to not be doing as well as I would like at this point. I do believe that he would benefit from sharp debridement at this site. Subsequently I think a total contact cast would be helpful for the right foot as well. 08/17/18; the patient arrived today with a total contact cast on the right foot. This actually looks quite good. He had gone to see Dr. Megan Salon of infectious disease about the osteomyelitis on the left foot. I think he is on IV Fortaz at dialysis although I am not exactly sure of the rationale for the Fortaz at the time of this dictation. He also arrived in today with a swelling on the lateral left foot this is the site of his original wounds in fact when I first saw this man when he was under Dr. Ardeen Garland care I think this was the site of where the wound was located. It was not particularly tender however using a small scalpel I opened it to [place] some moderate amount of purulent drainage. 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 Last MRI was in May that showed progressive osteomyelitis and bone destruction of the cuboid and base of the fifth metatarsal. Last week he had an abscess over this same area this was removed. Culture was negative. We are continuing with a total contact cast to the area on the third metatarsal head on the right and making some good progress here. The patient asked me again about renal transplant. He is not on the list because of the open wounds. 08/31/18; apparently the patient had an interruption in the [place] but  he is now back on this at dialysis. Apparently there was a misunderstanding and  Dr. Crissie Figures orders. Due to have the MRI of the left foot tonight. The area on the right foot continues to have callus thick skin and subcutaneous tissue around the wound edge that requires constant debridement however the wound is smaller we are using a total contact cast in this area. Alginate all wounds 09/07/18; without much surprise the MRI of his left foot showed osteomyelitis in the reminiscent of his fourth metatarsal but also the third metatarsal. She arrives in clinic today with the right foot and a total contact cast. There was purulent drainage coming out of the wound which I have cultured. Marked deterioration here with undermining widely around the wound orifice. Using pickups and a scalpel I remove callus and subcutaneous tissue from a substantial new opening. In a similar fashion the area on the left lateral foot that was blistered last week I have opened this and remove skin and subcutaneous tissue from this area to expose a obvious new wound. I think there is extension and communication between all of this on the left foot. The patient is on Fortaz at dialysis. Culture done of the right foot. We are clearly not making progress here. We have made him an appointment with Dr. [name] of orthopedics. I think an amputation of the left leg may be discussed. I don't think there is anything that can be done with foot salvage. 09/14/18; culture from the right foot last time showed a very resistant MRSA. This is resistant to doxycycline. I'm going to try to get linezolid at least for a week. He does not have a appointment with Dr. [name] yet. 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 I'll send a message to Dr. [name] of infectious disease. I want the patient to see Dr. [name] to go over the pros and cons of an amputation which I think will be a BKA. The  patient had a question about whether this is curative or not. I told him that I thought it would be although spread of staph aureus infection is not unheard of. MRI of the right foot was done at the end of April 2019 did not show osteomyelitis. The patient last saw Dr. [name] on 9/3. I'll send Dr. [name] a message. I would really like him to weigh the pros and cons of a BKA on the left otherwise he'll probably need IV antibiotics and perhaps hyperbaric oxygen again. He has not had a good response to this in the past. His MRI earlier this month showed progressive damage in the remnants of the fourth and third metatarsal 09/21/2018; sees Dr. [name] of orthopedics next week to discuss a left BKA in response to the osteomyelitis in the left foot. Using silver alginate to all wound areas. He is completing the Zyvox I did put in for him after last culture showed MRSA 09/29/2018; patient saw Dr. [name] of orthopedics discussed the osteomyelitis in the left foot third and fourth metatarsals. He did not recommend urgent surgery but certainly stated the only surgical option would be a BKA. He is communicating with Dr. [name]. The problem here is the instability of the areas on his foot which constantly generate draining abscesses. I will communicate with Dr. [name] about this. He has an appointment on 10/23 10/05/2018; patient sees Dr. [name] on 10/23. I have sent him a secure message not ordered any additional antibiotics for now. Patient continues to have 2 open areas on the left plantar foot  at the fifth metatarsal head and the lateral aspect of the foot. These have not changed all that much. The area on the right third met head still has thick callus and surrounding subcutaneous tissue we have been using silver alginate 10/12/2018; patient sees Dr. [name] or colleague on 10/23. 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 The area on the right  plantar foot is just about closed. The superior wound on the left fifth metatarsal head is callused over but I am not sure if this is closed. The area below it is about the same. Considerable amount of callus on the lateral foot. We have been using silver alginate to the wounds 10/19/2018; the patient saw Dr. [name] on 10/22. He wishes to try and continue to save the left foot. He has been given 6 weeks of oral ciprofloxacin. We continue to put a total contact cast on the right foot. The area over the fifth metatarsal head on the left is callused over/perhaps healed but I did not remove the callus to find out. He still has the wound on the left lateral midfoot requiring debridement. We have been using silver alginate to all wound areas 10/26/2018 On the left foot our intake nurse noted some purulent drainage from the inferior wound [currently the only one that is not callused over] On the right foot even though he is in total contact cast a considerable amount of thick black callus and surface eschar. On this side we have been using silver alginate under a total contact cast. he remains on ciprofloxacin as prescribed by Dr. [name] 11/02/2018; culture from last week which is done of the probing area on the left midfoot wound showed MRSA "few". I am going to need to contact Dr. [name] which I will do today I think he is going to need IV antibiotics again. The area on the left foot which was callused on the side is clearly separating today all of this was removed a copious amounts of callus and necrotic subcutaneous tissue. The area on the right plantar foot actually remained healthy looking with a healthy granulated base. We are using silver alginate to all wound areas 11/09/2018; unfortunately neither 1 of the patient's wounds areas looks at all satisfactory. On the left he has considerable necrotic debris from the plantar wound laterally over the foot. I removed copious amounts of material  including callus skin and subcutaneous tissue. On the right foot he arrives with undermining and frankly purulent drainage. Specimen obtained for culture and debridement of the callused skin and subcutaneous tissue from around the circumference. Because of this I cannot put him back in a total contact cast but to be truthful we are really unfortunately not making a lot of progress I did put in a secure text message to Dr. [name] wondering about the MRSA on the left foot. He suggested vancomycin although this is not started. I was left wondering if he expected me to send this into dialysis. Has we have more purulence on the right side wait for that result before calling dialysis 11/16/2018; I called dialysis earlier this week to get the vancomycin started. I think he got the first dose on Tuesday. The patient tells me that he fell earlier this week twisting his left foot and ankle and he is swelling. He continues to not look well. He had blood cultures done at dialysis on Tuesday he is not been informed of the results. 12/07/2018;  the patient was admitted to hospital from 11/22/2018 through 12/02/2018. He underwent a left BKA. Apparently had staph sepsis. In the meantime being off the right foot this is closed over. He follows up with Dr. 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 Subsequent to this he actually had multiple falls in fact he tells me to following which calls the wound to be his apparently. He has been seeing Dr. [name] and his physician assistant in the interim. They actually did want him to come back for reevaluation to see if there's anything we can do for me wound care perspective the help this area to heal more appropriately. The patient states that he does not have a tremendous amount of pain which is good news. No fevers, chills, nausea, or vomiting noted at this time.  We have gotten approval from Dr. [name] office had [place] for [place] to treat the patient for his stop wound. This is due to the fact that the patient was in the 90 day postop global. 1/21; the patient does not have an open wound on the right foot although he does have some pressure areas that will need to be padded when he is transferring. The dehisced surgical wound looks clean although there is some undermining of 1.5 cm superiorly. 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 This appears healthy but there is undermining. TheraSkin #1 2/11; TheraSkin #2 2/25; TheraSkin #3. Wound is measuring smaller 3/10; TheraSkin #4 wound is measuring smaller 3/24; TheraSkin #5 wound is measuring smaller and looks healthy. 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 He has no open area on the right foot although he does have some callused area over the original third metatarsal head wound. The third metatarsal is also subluxed on the right foot. I have warned him today that he cannot consider wearing a prosthesis for at least a month but he can go for measurements. He is going to need to keep the right foot padded is much as possible in his diabetic shoe indefinitely READMISSION 06/12/2019 Mr. Bisceglia is a type II diabetic on dialysis. He has been in this clinic multiple times with wounds on his bilateral lower extremities. 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 He also has a history of wounds on his right foot but tells me that everything is going well here. He actually came in here with his prosthesis walking with a cane. We were all really quite gratified to see this He states over the last several weeks he has had a painful area at the tip of the left third finger. His dialysis shunt in his is in  the left upper arm and this particularly hurts during dialysis when his fingers get numb and there is a lot of pain in the tip of his left third finger. I believe he is seen vascular surgery. He had a Doppler done to evaluate for a dialysis steal syndrome. He is going for a banding procedure 1 week from today towards the left AV fistula. I think if that is unsuccessful they will be looking at creating a new shunt. He had a course of doxycycline when he which he finished about  a week ago. He has been using Bactroban to the finger 6/25; the patient had his banding procedure and things seem to be going better. He is not having pain in his hand at dialysis. The area on the tip of his finger seems about closed. He did however have a paronychia I the last time I saw him. I have been advising him to use topical Bactroban washing the finger. Culture I did last time showed a few staph epidermidis which I was willing to dismiss as superficial skin contaminant. He arrives today with the finger wound looking better however the paronychia a seems worse 7/9; 2-week follow-up. He does not have an open wound on the tip of his third left finger. I thought he had an initial ischemic wound on the tip of the finger as well as a paronychia a medially. All of this appears to be closed. 8/6-Patient returns after 3 weeks for follow-up and has a right second met head plantar callus with a significant area of maceration hiding an ulcer ABI repeated today on right - 1.26 8/13-Patient returns at 1 week, the right second met head plantar wound appears to be slightly better, it is surrounded by the callus area we are using silver alginate 8/20; the patient came back to clinic 2 weeks ago with a wound on the right second metatarsal head. He apparently told me he developed callus in this area but also went away from his custom shoes to a pair of running shoes. Using silver alginate. He of course has the left BKA and prosthesis on  the left side. There might be much we can do to offload this although the patient tells me he has a wheelchair that he is using to try and stay off the wound is much as possible 8/27; right second metatarsal head. Still small punched out area with thick callus and subcutaneous tissue around the wound. We have been using silver collagen. This is always been an issue with this man's wound especially on this foot 9/3; right second met head. Still the same punched-out area with thick callus and subcutaneous tissue around the wound removing the circumference demonstrates repetitively undermining area. I have removed all the subcutaneous tissue associated with this. We have been using silver collagen 9/15; right second metatarsal head. Same punched-out thick callus and subcutaneous tissue around the wound area. Still requiring debridement we have been using silver collagen I changed back to silver alginate X-ray last time showed no evidence of osteomyelitis. Culture showed Klebsiella and he was started and Keflex apparently just 4 days ago 9/24; right second metatarsal head. He is completed the antibiotics I gave him for 10 days. Same punched-out thick callus and subcutaneous tissue around the area. Still requiring debridement. We have been using silver alginate. The area itself looks swollen and this could be just [place] that comes on this area with walking on a prosthesis on the left however I think he needs an MRI and I am going to order that today. There is not an option to offload this further 10/1; right second plantar metatarsal head. MRI booked for October 6. Generally looking better today using silver alginate 10/8; right second plantar metatarsal head. MRI that was done on 10/6 was negative for osteomyelitis noted prior amputations of the second and fourth toes at the [facility]. Prior amputation of the third toe to the base of the middle phalanx. Prior amputation of the fifth toe to the head of the  metatarsal. They also noted a  partially non-united stress fracture at the base of the third metatarsal. He does not recall about hearing about this. He did not have any pain but then again he has reduced sensation from diabetic neuropathy 10/15; right second plantar metatarsal head. Still in the same condition callus nonviable subcutaneous tissue which cleans up nicely with debridement but reforms by the next week. The patient tells me that he is offloading this is much as he can including taking his wheelchair to dialysis. We have been using silver alginate, changed to silver collagen today 10/22; right second plantar metatarsal head. Wound measures smaller but still thick callus around this wound. Still requiring debridement 11/5 right second plantar metatarsal head. Less callus around the wound circumference but still requiring debridement. There is still some undermining which I cleaned out the wound looks healthy but still considerable punched out depth with a relatively small circumference to the wound there is no palpable bone no purulent drainage 11/12; right second plantar metatarsal head. Small wound with thick callus tissue around the wound and significant undermining. We have been using silver collagen after my continuous debridements of this area 11/19; patient arrives in today with purulent drainage coming out of the wound in the second third met head area on the right foot. Serosanguineous thick drainage. Specimen obtained for culture. 11/21/2019 on evaluation today patient appears to be doing well with regard to his foot ulcer. The good news is is culture came back negative for any bacteria there was no growth. The other good news is his x-ray also appears to be doing well. The foot is also measuring much better than what it was. Overall I am very pleased with how things seem to be progressing. 12/22; patient was in [facility] for several weeks due to the death of a family  member. He said he stayed off the wound using silver alginate. 01/03/2020. Patient has a small open area with roughly 0.3 cm of circumferential undermining. The orifice looks smaller. We have been using silver collagen. There is not a wound more aggressive way to offload this area as he has a prosthesis on the other leg 1/21; again the same small area is 2 weeks ago. We have been using silver collagen I change that to endoform today I really believe that this is an offloading issue 2/4; the area on the right third met head. We have been using endoform. 2/11 the area on the right third met head we have been using silver alginate. 2/25; the area on the right third met head. There is much less callus on this today. The patient states he is offloading this even more aggressively. As an example he is taking his wheelchair into dialysis on most days 3/4; right third metatarsal head. Again he has eschar over the surface of the wound which I removed with a #3 curette. Some subcutaneous debris. This has 0.8 cm of direct probing depth. I cannot feel any bone here. I did do a culture the area 3/11; right third metatarsal head. Again an eschared area over the wound which almost makes this look superficial. Again debridement reveals a wound with probing depth probably not much different from last week there is no palpable bone no palpable purulence. The patient tells me that he is making every effort to offload this area properly. He is taking wheelchair into dialysis etc. There is no option for forefoot offloading or a total contact cast. We used Oasis #1 today 3/18; again callus and eschar over the wound  surface. I remove this but still a probing wound although only 3 mm in depth this time. This is an improvement 3/25; again callus and eschar over the wound surface which I removed the wound is much deeper today at 0.7 cm. There is no palpable bone. Because of the appearance of this a culture was done. I am not  going to put the Oasis back in this. Concerned about underlying infection. Notable for the fact that he is just finished doxycycline for the last go round of this 3/30; still 0.7 cm in depth which is disappointing. Culture I gave last time showed a few Staph aureus which is methicillin susceptible. This should have been taken care of by the recent course of doxycycline I gave him. We are using silver alginate 4/6; almost 1 cm in depth. We have been using silver alginate with underlying Bactroban. 4/15; about 1 cm in depth still. There is no palpable bone but there is undermining thick surface again as usual. Our intake nurse noted what looked to be purulent drainage I suppose that could have been Bactroban but I did a culture for CandS 4/20; depth down 0.7 cm. Culture from last time was negative we have been using Bactroban and silver alginate 4/29; depth down 0.5 cm. Using silver alginate. He reports some drainage 5/6; comes in with the wound worse. Wound is deeper and tunnels. Measures 0.9 depth and an undermining area of 1.8 cm. 5/18; there is a change the dressing last week. Still using Sorbact hydrogel. What I can see of the base of the wound looks better. 6/1. Not much change. I changed him to endoform today. He has 1.1 cm in depth no palpable bone 6/8; again a small punched out wound with significant undermining. Culture I did last week was negative. T oday he had some purulent drainage perhaps mixed with some retained endoform. 6/22; MRI report below IMPRESSION: Skin ulceration on the plantar surface of the foot at the level of the head of the second metatarsal. Mild marrow edema in the head of the second metatarsal could be reactive or due to osteomyelitis. Thin fluid collection surrounding the head and neck of the second metatarsal appears to communicate with the patient's skin wound and is worrisome for a septic collection. Advanced first MTP osteoarthritis. Prior amputations as  noted above. Electronically Signed By: Inge Rise M.D. On: 06/16/2020 11:51 PCR culture I did last time I believe showed peptostreptococcus. There was no comment on any other gram-negative or gram-positive organisms. I gave him a course of Flagyl which I think he is finishing up. We are not making any progress with the wound over the right second/third metatarsal head. We are using silver alginate putting him in kerlix Coban 6/29; has his appointment with Dr. [name] of orthopedics next Tuesday I think we see him on the same day. He was in for a nurse visit today. Nurse noted undermining of skin and callus so I was asked to look at this. We have been using silver alginate and kerlix Coban 7/6; patient saw Dr. [name] PA today will see Dr. [name] on Tuesday. The major question is does he needs surgery to clean out the area in question culture question resect any involved bone etc. Otherwise he will need to see infectious disease for broad-spectrum antibiotics given at dialysis 7/13; I talked to Dr. [name] late last week. He was not impressed with the MRI results thinking that the bone changes may have been reactive rather than osteomyelitis. He did  not think he needed drainage of an abscess thinking of the fluid we are seeing was superficial and contiguous with the actual wound. He did not think he needed surgery or resection. Recommended consideration of infectious disease and return him to our care. We have been using silver alginate Electronic Signature(s) Signed: 07/08/2020 5:16:39 PM By: Linton Ham MD Entered By: Linton Ham on 07/08/2020 14:27:03 -------------------------------------------------------------------------------- Physical Exam Details Patient Name: Date of Service: Kateri Plummer. 07/08/2020 1:00 PM Medical Record Number: [id number] Patient Account Number: [id number] Date of Birth/Sex: Treating RN: 1951/11/13 (68 y.o. [name] Primary Care Provider:  [name] Other Clinician: Referring Provider: Treating Provider/Extender: [name] in Treatment: 56 Notes Wound exam; small punched out area. I used a #5 scalpel to remove skin 6 callus and subcutaneous tissue. I am able to get to a clean wound margin this does not probe to bone there is no overt abscess. At this point I obtained a PCR culture Electronic Signature(s) Signed: 07/08/2020 5:16:39 PM By: Linton Ham MD Entered By: Linton Ham on 07/08/2020 14:27:40 -------------------------------------------------------------------------------- Physician Orders Details Patient Name: Date of Service: KASS, HERBERGER EL B. 07/08/2020 1:00 PM Medical Record Number: [id number] Patient Account Number: [id number] Date of Birth/Sex: Treating RN: 03-13-1951 (68 y.o. 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 Dressing Change Frequency Wound #41 Right Metatarsal head second Do not change entire dressing for one week. Skin Barriers/Peri-Wound Care Moisturizing lotion Wound Cleansing May shower with protection. - patient to use a cast protector. Primary Wound Dressing Wound #41 Right Metatarsal head second Calcium Alginate with Silver Secondary Dressing Wound #41 Right Metatarsal head second Dry Gauze Other: - felt to offload periwound. Edema Control Kerlix and Coban - Right Lower Extremity Avoid standing for long periods of time Elevate legs to  the level of the heart or above for 30 minutes daily and/or when sitting, a frequency of: - throughout the day Additional Orders / Instructions Other: - minimize walking and standing on foot to aid in wound healing and callus buildup. Laboratory naerobe culture (MICRO) - non healing wound - (ICD10 E11.621 - Type 2 diabetes mellitus with Bacteria identified in Unspecified specimen by A foot ulcer) LOINC Code: 856-3 Convenience Name: Anerobic culture Electronic Signature(s) Signed: 07/08/2020 5:16:39 PM By: Linton Ham MD Signed: 07/08/2020 5:57:23 PM By: Carlene Coria RN Entered By: Carlene Coria on 07/08/2020 14:24:14 -------------------------------------------------------------------------------- Problem List Details Patient Name: Date of Service: [name] Shines B. 07/08/2020 1:00 PM Medical Record Number: [id number] Patient Account Number: [id number] Date of Birth/Sex: Treating RN: [birth year] (68 y.o. 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, other extremity 06/12/2019 No Yes L03.115 Cellulitis of right lower limb 11/15/2019 No Yes Inactive Problems ICD-10 Code Description Active  Date Inactive Date L03.114 Cellulitis of left upper limb 06/12/2019 06/12/2019 L98.498 Non-pressure chronic ulcer of skin of other sites with other specified severity 06/12/2019 06/12/2019 S61.209D Unspecified open wound of unspecified finger without damage to nail, subsequent 06/12/2019 06/12/2019 encounter Resolved Problems Electronic Signature(s) Signed: 07/08/2020  5:16:39 PM By: Linton Ham MD Entered By: Linton Ham on 07/08/2020 14:25:26 -------------------------------------------------------------------------------- Progress Note Details Patient Name: Date of Service: [name] Shines B. 07/08/2020 1:00 PM Medical Record Number: [id number] Patient Account Number: [id number] Date of Birth/Sex: Treating RN: Sep 01, 1951 (68 y.o. [name] Primary Care Provider: [name] Other Clinician: Referring Provider: Treating Provider/Extender: [name] in Treatment: 39 Subjective History of Present Illness (HPI) The following HPI elements were documented for the patient's wound: Location: Patient presents with a wound to bilateral feet. Quality: Patient reports experiencing essentially no pain. Severity: Mildly severe wound with no evidence of infection Duration: Patient has had the wound for greater than 2 weeks prior to presenting for treatment The patient is a pleasant 69 yrs old bm here for evaluation of ulcers on the plantar aspect of both feet. 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 He has a left arm graft for access. He has been trying to stay off his feet for weeks but does not seem to have any improvement in the wound. He has been seeing someone at the foot center and was referred to the Wound Care center for further evaluation. 12/30/15 the patient has 3 wounds one over the right first metatarsal head and 2 on the left foot at the left fifth and lleft first metatarsal head. All of these look relatively similar. The one over the left fifth probe to bone I could not prove that any of the others did. I note his MRI in November that did not show osteomyelitis. His peripheral pulses seem robust. All of these underwent surgical debridement to remove callus nonviable skin and subcutaneous tissue 01/06/16; the patient had his sutures removed from the right fifth  ray amputation. There may be a small open part of this superiorly but otherwise the incision looks good. Areas over his right first, left first and left fifth metatarsal head all underwent surgical debridement as a varying degree of callus, skin and nonviable subcutaneous tissue. The area that is most worrisome is the right fifth metatarsal head which has a wound probes precariously close to bone. There is no purulent drainage or erythema 01/13/16; I'm not exactly sure of the status of the right fifth ray amputation site however he follows with Dr. [name] later this week. The area over his right first plantar metatarsal head, left first and fifth plantar metatarsal head are all in the same status. Thick circumferential callus, nonviable subcutaneous tissue. Culture of the left fifth did not culture last week 01/20/16; all of the patient's wounds appear and roughly the same state although his amputation site on the right lateral foot looks better. His wounds over the right first, left first and left fifth metatarsal heads all underwent difficult surgical debridement removing circumferential callus nonviable skin and subcutaneous tissue. There is no overt evidence of infection in these areas. MRI at the end of December of the left foot did not show osteomyelitis, right foot showed osteomyelitis of the right fifth digit he is now status post amputation. 01/27/16 the patient's wounds over his plantar first and fifth metatarsal heads on the left all appear better having been started on a  total contact cast last week. They were debridement of circumferential callus and nonviable subcutaneous tissue as was the wound over the first metatarsal head. His surgical incision on the right also had some light surface debridement done. 03/23/2016 -- the patient was doing really well and most of his wounds had almost completely healed but now he came back today with a history of having a discharge from the area of his  right foot on the plantar aspect and also between his first and second toe. He also has had some discharge from the left foot. Addendum: I spoke to the PA [name] at the dialysis center whose fax number is [phone]. We discussed the infection the patient has and she will put the patient on vancomycin and Fortaz until the final culture report is back. We will fax this as soon as available. 03/30/2016 -- left foot x-ray IMPRESSION:No definitive osteomyelitis noted. X-ray of the right foot -- IMPRESSION: 1. Soft tissue swelling. Prior amputation right fifth digit. No acute or focal bony abnormality identified. If osteomyelitis remains a clinical concern, MRI can be obtained. 2. Peripheral vascular disease. His culture reports have grown an MSSA -- and we will fax this report to his hemodialysis center. He was called in a prescription of oral doxycycline but I have told her not to fill this in as he is already on IV antibiotics. 04/06/2016 -- a few days ago, I spoke to the hemodialysis center nurse who had stopped the IV antibiotics and he was given a prescription for doxycycline 100 mg by mouth twice a day for a week and he is on this at the present time. 05/11/2016 -- he has recently seen his PCP this week and his hemoglobin A1c was 7. He is working on his paperwork to get his orthotic shoes. 05/25/2016 -- -- x-ray of the left foot IMPRESSION:No acute bony abnormality. No radiographic changes of acute osteomyelitis. No change since prior study. X-ray of the right foot -- IMPRESSION: Postsurgical changes are seen involving the fifth toe. No evidence of acute osteomyelitis. he developed a large blister on the medial part of his right foot and this opened out and drain fluid. 06/01/2016 -- he has his MRI to be done this afternoon. 06/15/2016 -- MRI of the left forefoot without contrast shows 2 separate regions of cutaneous and subcutaneous edema and possible ulceration and blistering along  the ball of the foot. No obvious osteomyelitis identified. MRI of the right foot showed cutaneous and subcutaneous thickening plantar to the first digit sesamoid with an ulcer crater but no underlying osteomyelitis is identified. 06/22/2016 -- the right foot plantar ulcer has been draining a lot of seropurulent material for the last few days. 06/30/2016 -- spoke to the dialysis center and I believe I spoke to [place] a PA at the center who discussed with me and agreed to putting Legrand Como on vancomycin until his cultures arrive. On review of his culture report no WBCs were seen or no organisms were seen and the culture was reincubated for better growth. The final report is back and there were no predominant growth including Streptococcus or Staphylococcus. Clinically though he has a lot of drainage from both wounds a lot of undermining and there is further blebs on the left foot towards the interspace between his first and second toe. 08/10/2016 -- the culture from the right foot showed normal skin flora and there was no Staphylococcus aureus was group A streptococcus isolated. 09/21/2016 -- -- MRI of the right foot  was done on 09/13/2016 - IMPRESSION: Findings most consistent with acute osteomyelitis throughout the great toe, sesamoid bones and plantar aspect of the head of the first metatarsal. Fluid in the sheath of the flexor tendon of the great toe could be sympathetic but is worrisome for septic tenosynovitis. First MTP joint effusion worrisome for septic joint. He was admitted to the hospital on 09/11/2016 and treated for a fever with vancomycin and cefepime. He was seen by Dr. Bobby Rumpf of infectious disease who recommended 6 weeks treatment with vancomycin and ceftazidime with his hemodialysis and to continue to see as in the wound clinic. Vascular consult was pending. The patient was discharged home on 09/14/2016 and he would continue with IV antibiotics for 6 weeks. 09/28/16 wound  appears reasonably healthy. Continuing with total contact cast 10/05/16 wound is smaller and looking healthy. Continue with total contact cast. He continues on IV antibiotics 10/12/2016 -- he has developed a new wound on the dorsal aspect of his left big toe and this is a superficial injury with no surrounding cellulitis. He has completed 30 days of IV antibiotics and is now ready to start his hyperbaric oxygen therapy as per his insurance company's recommendation. 10/19/2016 -- after the cast was removed on the right side he has got good resolution of his ulceration on the right plantar foot. he has a new wound on the left plantar foot in the region of his fourth metatarsal and this will need sharp debridement. 10/26/2016 -- Xray of the right foot complete: IMPRESSION: Changes consistent with osteomyelitis involving the head of the first metatarsal and base of the first proximal phalanx. The sesamoid bones are also likely involved given their positioning. 11/02/2016 -- there still awaiting insurance clearance for his hyperbaric oxygen therapy and hopefully he will begin treatment soon. 11/09/2016 -- started with hyperbaric oxygen therapy and had some barotrauma to the right ear and this was seen by ENT who was prescribed Afrin drops and would probably continue with HBO and he is scheduled for myringotomy tubes on Friday 11/16/2016 -- he had his myringotomy tubes placed on Friday and has been doing much better after that with some fluid draining out after hyperbaric treatment today. The pain was much better. 11/30/2016 -- over the last 2 days he noticed a swelling and change of color of his right second toe and this had been draining minimal fluid. 12/07/2016 -- x-ray of the right foot -- IMPRESSION:1. Progressive ulceration at the distal aspect of the second digit with significant soft tissue swelling and osseous changes in the distal phalanx compatible with osteomyelitis. 2. Chronic osteomyelitis  at the first MTP joint. 3. Ulcerations at the second and third toes as well without definite osseous changes. Osteomyelitis is not excluded. 4. Fifth digit amputation. On 12/01/2016 oo I spoke to Dr. Bobby Rumpf, the infectious disease specialist, who kindly agreed to treat this with IV antibiotics and he would call in the order to the dialysis center and this has been discussed in detail with the patient who will make the appropriate arrangements. The patient will also book an appointment as soon as possible to see Dr. [name] in the office. Of note the patient has not been on antibiotics this entire week as the dialysis center did not receive any orders from Dr. [name]. I got in touch with Dr. [name] who tells me the patient has an appointment to see him this coming Wednesday. 12/14/2016 -- the patient is on ceftazidime and vancomycin during his dialysis, and I  understand this was put on by his nephrologist Dr. Raliegh Ip possibly after speaking with infectious disease Dr. [name] 12/23/16; patient was on my schedule today for a wound evaluation as he had difficulties with his schedule earlier this week. I note that he is on vancomycin and ceftazidine at dialysis. In spite of this he arrives today with a new wound on the base of the right second toe this easily probes to bone. The known wound at the tip of the second toe With this area. He also has a superficial area on the medial aspect of the third toe on the side of the DIP. This does not appear to have much depth. The area on the plantar left foot is a deep area but did not probe the bone 12/28/16 -- he has brought in some lab work and the most recent labs done showed a hemoglobin of 12.1 hematocrit of 36.3, neutrophils of 51% WBC count of 5.6, BN of 53, albumin of 4.1 globulin of 3.7, vancomycin 13 g per mL. 01/04/2017 -- he saw Dr. [name] who has recommended a amputation of the right second toe and he is awaiting this date. He sees Dr. Bobby Rumpf of infectious disease tomorrow. The bone culture taken on 12/28/2016 had no growth in 2 days. 01/11/2017 -- was seen by Dr. Bobby Rumpf regarding the management and has recommended a eval by vascular surgeons. He recommended to continue the antibiotics during his hemodialysis. Xray of the left foot -- IMPRESSION: No acute fracture, dislocation, or osseous erosion identified. 01/18/2017 -- he has his vascular workup later today and he is going to have his right foot second toe amputation this coming Friday by Dr. [name]. We have also put in for a another 30 treatments with hyperbaric oxygen therapy 01/25/2017 -- I have reviewed Dr. Donnetta Hutching is vascular report from last week where he reviewed him and thought his vascular function was good enough to heal his amputation site and no further tests were recommended. He also had his orthopedic related to surgery which is still pending the notes and we will review these next week. 02/01/2017 -- the operative remote of Dr. 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 02/08/2017 --he was seen by Dr. [name] on 02/03/2017 -- patient is on [place]. after a thorough review he had recommended to continue with antibiotics during hemodialysis. The patient was seen by Dr. [name], but I do not find any follow-up note on the electronic medical record. he has removed the dressing over the right foot to remove the sutures and asked him to see him back in a month's time 02/22/2017 -- patient has been febrile and has been having symptoms of the upper respiratory tract infection but has not been checked  for the flu and it's been over 4 days now. He says he is feeling better today. Some drainage between his left first and second toe and this needed to be looked at 03/08/2017 -- was seen by Dr. Bobby Rumpf on 03/07/2017 after review he will stop his antibiotics and see him back in a month to see if he is feeling well. 03/15/2017 -- he has completed his course of hyperbaric oxygen therapy  and is doing fine with his health otherwise. 03/22/2017 -- his nutritionist at the dialysis center has recommended a protein supplement to help build his collagen and we will prescribe this for him when he has the details 05/03/2017 -- he recently noticed the area on the plantar aspect of his fifth metatarsal head which opened out and had minimal drainage. 05/10/2017 -- -- x-ray of the left foot -- IMPRESSION:1. No convincing conventional radiographic evidence of active osteomyelitis.2. Active soft tissue ulceration at the tip of the fifth digit, and at the lateral aspect of the foot adjacent the base of the fifth metatarsal. 3. Surgical changes of prior fourth toe amputation. 4. 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 5. Small vessel atherosclerotic vascular calcifications. 6. 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 ======= Readmission after 5 weeks: 06/15/2017 -- the patient returns after 5 weeks having had an MRI done on 05/17/2017 MRI of the left foot without contrast showed soft tissue ulcer overlying the base of the fifth metatarsal. 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 No drainable fluid collection to suggest an abscess. He was in hospital between 05/16/2017 and 05/25/2017 -- and on discharge was asked to follow-up with Dr. [name] and Dr. [name]. He was treated 2 weeks post discharge with vancomycin plus ceftriaxone with hemodialysis and oral metronidazole 500 mg 3 times a  day. 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 last hemoglobin A1c was 6.7% He had a postoperative MR of the foot on 05/23/2017 -- which showed no new areas of cortical bone loss, edema or soft tissue ulceration to suggest osteomyelitis. the patient has completed his course of IV antibiotics during dialysis and is to see the infectious disease doctor tomorrow. Dr. [name] had seen him after suture removal and asked him to keep the wound with a dry dressing 06/21/2017 -- he was seen by Dr. Bobby Rumpf of infectious disease on 06/16/2017 -- he stopped his Flagyl and will see him back in 6 weeks. He was asked to follow-up with Dr. [name] and with the wound clinic clinic. 07/05/17; bone biopsy from last time showed acute osteomyelitis. This is from the reminiscent in the left fifth metatarsal. Culture result is apparently still pending [holding for anaerobe}. From my understanding in this case this is been a progressive necrotic wound which is deteriorated markedly over the last 3 weeks since he returned here. He now has a large area of exposed bone which was biopsied and cultured last week. Dr. Graylon Good has put him on vancomycin and Fortaz during his hemodialysis and Flagyl orally. He is to see Dr. [name] next week 07/19/2017 -- the patient was reviewed by Dr. [name] of orthopedics who reviewed the case in detail and agreed with the plan to continue with IV antibiotics, aggressive wound care and hyperbaric oxygen therapy. He would see him back in 3 weeks' time 08/09/2017 -- saw Dr. Bobby Rumpf on 08/03/2017 -he was restarted on his antibiotics for 6 weeks which included vanco, ceftaz and Flagyl. he recommended continuing his antibiotics for 6 weeks and reevaluate his completion date. He would continue with wound care and hyperbaric oxygen therapy. 08/16/2017 -- I understand he will be completing his 6 weeks of  antibiotics sometime later this week. 09/19/2017 -- he has been without his wound VAC for the last week due to lack of  supplies. He has been packing his wound with silver alginate. 10/04/2017 -- he has an appointment with Dr. [name] tomorrow and hence we will not apply the wound VAC after his dressing changes today. 10/11/2017 -- he was seen by Dr. Bobby Rumpf on 10/05/2017, noted that the patient was currently off antibiotics, and after thorough review he recommended he follow-up with the wound care as per plan and no further antibiotics at this point. 11/29/17; patient is arrived for the wound on his left lateral foot.Marland Kitchen He is completed IV antibiotics and 2 rounds of hyperbaric oxygen for treatment of underlying osteomyelitis. He arrives today with a surface on most of the wound area however our intake nurse noted drainage from the superior aspect. This was brought to my attention. 12/13/2017 -- the right plantar foot had a large bleb and once the bleb was opened out a large callus and subcutaneous debris was removed and he has a plantar ulcer near the fourth metatarsal head 12/28/17 on evaluation today patient appears to be doing acutely worse in regard to his left foot. The wound which has been appearing to do better as now open up more deeply there is bone palpable at the base of the wound unfortunately. He tells me that this feels "like it did when he had osteomyelitis previously" he also noted that his second toe on the left foot appears to be doing worse and is swollen there does appear to be some fluid collected underneath. His right foot plantar ulcer appears to be doing somewhat better at this point and there really is no complication at the site currently. No fevers, chills, nausea, or vomiting noted at this time. Patient states that he normally has no pain at this site however. T oday he is having significant pain. 01/03/18; culture done last week showed methicillin sensitive staph aureus  and group B strep. He was on Septra however he arrives today with a fever of 101. He has a functional dialysis shunt in his left arm but has had no pain here. No cough. He still makes some urine no dysuria he did have abdominal pain nausea and diarrhea over the weekend but is not had any diarrhea since yesterday. Otherwise he has no specific complaints 01/05/18; the patient returns today in follow-up for his presentation from 1/8. At that point he was febrile. I gave him some Levaquin adjusted for his dialysis status. He tells me the fever broke that night and it is not likely that this was actually a wound infection. He is gone on to have an MRI of the left foot; this showed interval development of an abnormal signal from the base of the third and fourth metatarsal and in the cuboid reminiscent consistent with osteomyelitis. There is no mention of the left second toe I think which was a concern when it was ordered. The patient is taking his Levaquin 01/10/18; the patient has completed his antibiotics today. The area on the left lateral foot is smaller but it still probes easily to bone. He has underlying osteomyelitis here and sees Dr. Megan Salon of infectious disease this week ooThe area on the right third plantar metatarsal head still requires debridement not much change in dimensions [facility] has a new wound on the medial tip of his right third toe again this probes to bone. He only noticed this 2 days ago 01/17/18; the patient saw Dr. [name] last week and he is back on Vanco and Fortaz at dialysis. This is related to the osteomyelitis in the  base of his left lateral foot which I think is the bases of his third and fourth metatarsals and cuboid remnant. We are previously seeing him for a wound also on the base of the fourth med head on the right. X-ray of this area did not show osteomyelitis in relation to a new wound on the tip of his right third toe. Again this probes to bone. Finally his second toe on  the left which didn't really show anything on the MRI at least no report appears to have a separated cutaneous area which I think is going to come right off the tip of his toe and leave exposed bone. Whether this is infectious or ischemic I am not clear Culture I did from the third toe last week which was his new wound was negative. Plain x-ray on the right foot did not show osteomyelitis in the toes 01/24/18; the patient is going went to see Dr. [name]. He is going to have a amputation of the left second and right third toe although paradoxically the left second toe looks better than last week. We have treating a deep probing wound on the left lateral foot and the area over the third metatarsal head on the right foot. 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 Also a debridement including bone of the left lateral foot and a closure which is still sutured. This is a bit surprising. Also apparent debridement of the base of the right third toe wound. 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 I'm not sure exactly where we are in the frame of things here. The wound on the left lateral foot is still sutured also the amputation sites of the left second and right third toe. He is been using Xeroform to the sutured areas silver alginate to the right foot 02/14/18; he continues on Rocephin at dialysis for osteomyelitis. I still don't have a good sense of where we are in the treatment duration. The wounds on the left lateral foot had the sutures removed and this clearly still probes deeply. We debrided the area with a #5 curet. We'll use silver alginate to the wound oothe area over the right third met head also required debridement of callus skin and subcutaneous tissue and necrotic  debris over the wound surface. This tunnels superiorly but I did not unroofed this today. ooThe areas on the tip of his toes have a crusted surface eschar I did not debridement this either. 02/21/18; he continues on Rocephin at dialysis for osteomyelitis. I don't have a good sense of where we are and the treatment duration. The wounds on the left lateral foot is callused over on presentation but requires debridement. The area on the right third med head plantar aspect actually is measuring smaller 02/28/18;patient is on Vanco and Fortaz at dialysis, I'm not sure why I thought this was Rocephin above unless it is been changed. I don't have a good sense of time frame here. Using silver collagen to the area on the left lateral foot and right third med head plantar foot 03/07/18; patient is completing bank and Fortaz at dialysis soon. He is using Silver collagen to the area on the left lateral foot and the right third metatarsal head. He has a smaller superficial wound  distal to the left lateral foot wound. 03/14/18-he is here in follow-up evaluation for multiple ulcerations to his bilateral feet. He presents with a new ulceration to the plantar aspect of the left foot underneath the blister, this was deroofed to reveal a partial thickness ulcer. He is voicing no complaints or concerns, tolerated dialysis yesterday. We will continue with same treatment plan and he will follow-up next week 03/21/18; the patient has a area on the lateral left foot which still has a small probing area. The overall surface area of the wound is better. He presented with a new ulceration on the left foot plantar fifth metatarsal head last week. The area on the right third metatarsal head appears smaller.using silver alginate to all wound areas. The patient is changing the dressing himself. He is using a [place] forefoot offloading are on the right and a healing sandal on the left 03/28/18; original wound left lateral foot. Still nowhere  close to looking like a heeling surface oosecondary wound on the left lateral foot at the base of his question fifth metatarsal which was new last week ooRight third metatarsal head. ooWe have been using silver alginate all wounds 04/04/18; the original wound on the left lateral foot again heavy callus surrounding thick nonviable subcutaneous tissue all requiring debridement. The new wound from 2 weeks ago just near this at the base of the fifth metatarsal head still looks about the same ooUnfortunately there is been deterioration in the third medical head wound on the right which now probes to bone. I must say this was a superficial wound at one point in time and I really don't have a good frame of reference year. I'll have to go back to look through records about what we know about the right foot. 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 He is also been to see Dr. [name]. Somewhere in here as somebody is ordered a VASCULAR evaluation which was done on 03/22/18. On the right is anterior tibial artery was monophasic triphasic at the posterior tibial artery. On the left anterior tibial artery monophasic posterior tibial artery monophasic. ABI in the right was 1.43 on the left 1.21. TBIs on the right at 0.41 and on the left at 0.32. A vascular consult was recommended and I think has been arranged. 04/11/18; Mr. Derousse has not had an MRI of the right foot since 2017. Recent x-ray of the right foot done in January and February was negative. However he has had a major deterioration in the wound over the third met head. He is completed antibiotics last week at dialysis [place]. I think he is going to see vascular in follow-up. The areas on the left lateral foot and left plantar fifth metatarsal head both look satisfactory. I debrided both of these areas although the tissue here looks good. The area  on the left lateral foot once probe to bone it certainly does not do that now 04/18/18; MRI of the right foot is on Thursday. He has what looks to be serosanguineous purulent drainage coming out of the wound over the right third metatarsal head today. He completed antibiotics bank and Fortaz 2 weeks ago at dialysis. He has a vascular follow-up with regards to his arterial insufficiency although I don't exactly see when that is booked. The areas on the left foot including the lateral left foot and the plantar left fifth metatarsal head look about the  same. 04/25/18-He is here in follow-up evaluation for bilateral foot ulcers. MRI obtained was negative for osteomyelitis. Wound culture was negative. We will continue with same treatment plan he'll follow-up next week 05/02/18; the right plantar foot wound over the third metatarsal head actually looks better than when I last saw this. His MRI was negative for osteomyelitis wound culture was negative. ooOn the left plantar foot both wounds on the plantar fifth metatarsal head and on the lateral foot both are covered in a very hard circumferential callus. 05/09/18; right plantar foot wound over the third metatarsal head stable from last week. ooLeft plantar foot wound over the fifth metatarsal head also stable but with callus around both wound areas ooThe area on the left lateral foot had thick callus over a surface and I had some thoughts about leaving this intact however it felt boggy. ooWe've been using silver alginate all wounds 05/16/18; since the patient was last here he was hospitalized from 5/15 through 5/19. 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 This grew MRSA. He was placed on vancomycin.MRI of the left foot suggested "progressive" osteomyelitis and bone destruction of the cuboid and the base of the fifth metatarsal. Stable erosive changes  of the base of the second metatarsal. I'm wondering if they're aware that he had surgery and debridement in the area of the underlying bone previously by Dr. [name]. In any case, He was also revascularized with an angioplasty and stenting of the left tibial peroneal trunk and angioplasty of the left posterior tibial artery. He is on vancomycin at dialysis. He has a 2 week follow-up with infectious disease. He has vascular surgery follow-up. He was started on Plavix. 05/23/18; the patient's wound on the lateral left foot at the level of the fifth metatarsal head and the plantar wound on the plantar fifth metatarsal head both look better. The area on the right third metatarsal head still has depth and undermining. We've been using silver alginate. He is on vancomycin. He has been revascularized on the left 05/30/18; he continues on vancomycin at dialysis. Revascularized on the left. The area on the left lateral foot is just about closed. Unfortunately the area over the plantar fifth metatarsal head undermining superiorly as does the area over the right third metatarsal head. Both of these significantly deteriorated from last week. He has appointments next week with infectious disease and orthopedics I delayed putting on a cast on the left until those appointments which therefore we made we'll bring him in on Friday the 14th with the idea of a cast on the left foot 06/06/18; he continues on vancomycin at dialysis. Revascularized on the left. He arrives today with a ballotable swelling just above the area on the left lateral foot where his previous wound was. He has no pain but he is reasonably insensate. The difficult areas on the plantar left fifth metatarsal head looks stable whereas the area on the right third plantar metatarsal head about the same as last week there is undermining here although I did not unroofed this today. He required a considerable debridement of the swelling on the left lateral foot  area and I unroofed an abscess with a copious amount of brown purulent material which I obtained for culture. I don't believe during his recent hospitalization he had any further imaging although I need to review this. Previous cultures from this area done in this  clinic showed MRSA, I would be surprised if this is not what this is currently even though he is on vancomycin. 06/13/18; he continues on vancomycin at dialysis however he finishes this on Sunday and then graduates the doxycycline previously prescribed by Dr. [name]. The abscess site on the lateral foot that I unroofed last time grew moderate amounts of methicillin-resistant staph aureus that is both vancomycin and tetracycline sensitive so we should be okay from that regard. He arrives today in clinic with a connection between the abscess site and the area on the lateral foot. We've been using silver alginate to all his wound areas. The right third metatarsal head wound is measuring smaller. Using silver alginate on both wound areas 06/20/18; transitioning to doxycycline prescribed by Dr. [name]. The abscess site on the lateral foot that I unroofed 2 weeks ago grew MRSA. That area has largely closed down although the lateral part of his wound on the fifth metatarsal head still probes to bone. I suspect all of this was connected. ooOn the right third metatarsal head smaller looking wound but surrounded by nonviable tissue that once again requires debridement using silver alginate on both areas 06/27/18; on doxycycline 100 twice a day. The abscess on the lateral foot has closed down. Although he still has the wound on the left fifth metatarsal head that extends towards the lateral part of the foot. The most lateral part of this wound probes to bone ooRight third metatarsal head still a deep probing wound with undermining. Nevertheless I elected not to debridement this this week ooIf everything is copious static next week on the left I'm  going to attempt a total contact cast 07/04/18; he is tolerating his doxycycline. The abscess on the left lateral foot is closed down although there is still a deep wound here that probes to bone. We will use silver collagen under the total contact cast Silver collagen to the deep wound over the right third metatarsal head is well 07/11/18; he is tolerating doxycycline. The left lateral fifth plantar metatarsal head still probes deeply but I could not probe any bone. We've been using silver collagen and this will be the second week under the total contact cast ooSilver collagen to deep wound over the right third metatarsal head as well 07/18/18; he is still taking doxycycline. The left lateral fifth plantar metatarsal this week probes deeply with a large amount of exposed bone. Quite a deterioration. He required extensive debridement. Specimens of bone for pathology and CNS obtained ooAlso considerable debridement on the right third metatarsal head 07/25/18; bone for pathology last week from the lateral left foot showed osteomyelitis. CandS of the bone Enterobacter. And Klebsiella. He is now on ciprofloxacin and the doxycycline is stopped [Dr. [name] of infectious disease] area He has an appointment with Dr. [name] tomorrow I'm going to leave the total contact cast off for this week. May wish to try to reapply that next week or the week after depending on the wound bed looks. I'm not sure if there is an operative option here, previously is followed with Dr. [name] 08/01/18 on evaluation today patient presents for reevaluation. He has been seen by infectious disease, Dr. [name], and he has placed the patient back on Ceftaz currently to be given to him at dialysis three times a week. 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 Fortunately there does not appear to be the evidence of infection at this point  in time. Overall the patient  has been tolerating the dressing changes without complication. We are no longer due lives in the cast of left foot secondary to the infection obviously. 08/10/18 on evaluation today patient appears to be doing rather well all things considered in regard to his left plantar foot ulcer. He does have a significant callous on the lateral portion of his left foot he wonders if I can help clean that away to some degree today. Fortunately he is having no evidence of infection at this time which is good news. No fevers chills noted. With that being said his right plantar foot actually does have a significant callous buildup around the wound opening this seems to not be doing as well as I would like at this point. I do believe that he would benefit from sharp debridement at this site. Subsequently I think a total contact cast would be helpful for the right foot as well. 08/17/18; the patient arrived today with a total contact cast on the right foot. This actually looks quite good. He had gone to see Dr. Megan Salon of infectious disease about the osteomyelitis on the left foot. I think he is on IV Fortaz at dialysis although I am not exactly sure of the rationale for the Fortaz at the time of this dictation. He also arrived in today with a swelling on the lateral left foot this is the site of his original wounds in fact when I first saw this man when he was under Dr. Ardeen Garland care I think this was the site of where the wound was located. It was not particularly tender however using a small scalpel I opened it to [place] some moderate amount of purulent drainage. 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 Last MRI was in May that showed progressive osteomyelitis and bone destruction of the cuboid and base of the fifth metatarsal. Last week he had an abscess over this same area this was removed.  Culture was negative. ooWe are continuing with a total contact cast to the area on the third metatarsal head on the right and making some good progress here. The patient asked me again about renal transplant. He is not on the list because of the open wounds. 08/31/18; apparently the patient had an interruption in the [place] but he is now back on this at dialysis. Apparently there was a misunderstanding and Dr. Crissie Figures orders. Due to have the MRI of the left foot tonight. ooThe area on the right foot continues to have callus thick skin and subcutaneous tissue around the wound edge that requires constant debridement however the wound is smaller we are using a total contact cast in this area. Alginate all wounds 09/07/18; without much surprise the MRI of his left foot showed osteomyelitis in the reminiscent of his fourth metatarsal but also the third metatarsal. She arrives in clinic today with the right foot and a total contact cast. There was purulent drainage coming out of the wound which I have cultured. Marked deterioration here with undermining widely around the wound orifice. Using pickups and a scalpel I remove callus and subcutaneous tissue from a substantial new opening. ooIn a similar fashion the area on the left lateral foot that was blistered last week I have opened this and remove skin and subcutaneous tissue from this area to expose a obvious new wound. I think there is  extension and communication between all of this on the left foot. ooThe patient is on Fortaz at dialysis. Culture done of the right foot. We are clearly not making progress here. ooWe have made him an appointment with Dr. [name] of orthopedics. I think an amputation of the left leg may be discussed. I don't think there is anything that can be done with foot salvage. 09/14/18; culture from the right foot last time showed a very resistant MRSA. This is resistant to doxycycline. I'm going to try to get linezolid at least  for a week. He does not have a appointment with Dr. [name] yet. 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 I'll send a message to Dr. [name] of infectious disease. I want the patient to see Dr. [name] to go over the pros and cons of an amputation which I think will be a BKA. The patient had a question about whether this is curative or not. I told him that I thought it would be although spread of staph aureus infection is not unheard of. MRI of the right foot was done at the end of April 2019 did not show osteomyelitis. The patient last saw Dr. [name] on 9/3. I'll send Dr. [name] a message. I would really like him to weigh the pros and cons of a BKA on the left otherwise he'll probably need IV antibiotics and perhaps hyperbaric oxygen again. He has not had a good response to this in the past. His MRI earlier this month showed progressive damage in the remnants of the fourth and third metatarsal 09/21/2018; sees Dr. [name] of orthopedics next week to discuss a left BKA in response to the osteomyelitis in the left foot. Using silver alginate to all wound areas. He is completing the Zyvox I did put in for him after last culture showed MRSA 09/29/2018; patient saw Dr. [name] of orthopedics discussed the osteomyelitis in the left foot third and fourth metatarsals. He did not recommend urgent surgery but certainly stated the only surgical option would be a BKA. He is communicating with Dr. [name]. The problem here is the instability of the areas on his foot which constantly generate draining abscesses. I will communicate with Dr. [name] about this. He has an appointment on 10/23 10/05/2018; patient sees Dr. [name] on 10/23. I have sent him a secure message not ordered any additional antibiotics for now. Patient continues to have 2 open areas on the left plantar foot at the fifth metatarsal head and the lateral aspect of the foot. These have not  changed all that much. The area on the right third met head still has thick callus and surrounding subcutaneous tissue we have been using silver alginate 10/12/2018; patient sees Dr. [name] or colleague on 10/23. 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 The area on the right plantar foot is just about closed. The superior wound on the left fifth metatarsal head is callused over but I am not sure if this is closed. The area below it is about the same. Considerable amount of callus on the lateral foot. We have been using silver alginate to the wounds 10/19/2018; the patient saw Dr. [name] on 10/22. He wishes to try and continue to save the left foot. He has been given 6 weeks of oral ciprofloxacin. We continue to put a total contact cast on the right foot. The area over the  fifth metatarsal head on the left is callused over/perhaps healed but I did not remove the callus to find out. He still has the wound on the left lateral midfoot requiring debridement. We have been using silver alginate to all wound areas 10/26/2018 ooOn the left foot our intake nurse noted some purulent drainage from the inferior wound [currently the only one that is not callused over] ooOn the right foot even though he is in total contact cast a considerable amount of thick black callus and surface eschar. On this side we have been using silver alginate under a total contact cast. oohe remains on ciprofloxacin as prescribed by Dr. [name] 11/02/2018; culture from last week which is done of the probing area on the left midfoot wound showed MRSA "few". I am going to need to contact Dr. [name] which I will do today I think he is going to need IV antibiotics again. The area on the left foot which was callused on the side is clearly separating today all of this was removed a copious amounts of callus and necrotic subcutaneous tissue. ooThe area on the right  plantar foot actually remained healthy looking with a healthy granulated base. ooWe are using silver alginate to all wound areas 11/09/2018; unfortunately neither 1 of the patient's wounds areas looks at all satisfactory. On the left he has considerable necrotic debris from the plantar wound laterally over the foot. I removed copious amounts of material including callus skin and subcutaneous tissue. On the right foot he arrives with undermining and frankly purulent drainage. Specimen obtained for culture and debridement of the callused skin and subcutaneous tissue from around the circumference. Because of this I cannot put him back in a total contact cast but to be truthful we are really unfortunately not making a lot of progress I did put in a secure text message to Dr. [name] wondering about the MRSA on the left foot. He suggested vancomycin although this is not started. I was left wondering if he expected me to send this into dialysis. Has we have more purulence on the right side wait for that result before calling dialysis 11/16/2018; I called dialysis earlier this week to get the vancomycin started. I think he got the first dose on Tuesday. The patient tells me that he fell earlier this week twisting his left foot and ankle and he is swelling. He continues to not look well. He had blood cultures done at dialysis on Tuesday he is not been informed of the results. 12/07/2018; the patient was admitted to hospital from 11/22/2018 through 12/02/2018. He underwent a left BKA. Apparently had staph sepsis. In the meantime being off the right foot this is closed over. He follows up with Dr. 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 Subsequent to this he actually had multiple falls in fact he tells me to following which calls the wound to be his apparently. He has been seeing Dr. [name] and  his physician assistant in the interim. They actually did want him to come back for reevaluation to see if there's anything we can do for me wound care perspective the help this area to heal more appropriately. The patient states that he does not have a tremendous amount of pain which is good news. No fevers, chills, nausea, or vomiting noted at this time. We have gotten approval from Dr. [name] office had [address] for [place] to treat  the patient for his stop wound. This is due to the fact that the patient was in the 90 day postop global. 1/21; the patient does not have an open wound on the right foot although he does have some pressure areas that will need to be padded when he is transferring. The dehisced surgical wound looks clean although there is some undermining of 1.5 cm superiorly. 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 This appears healthy but there is undermining. TheraSkin #1 2/11; TheraSkin #2 2/25; TheraSkin #3. Wound is measuring smaller 3/10; TheraSkin #4 wound is measuring smaller 3/24; TheraSkin #5 wound is measuring smaller and looks healthy. 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 He has no open area on the right foot although he does have some callused area over the original third metatarsal head wound. The third metatarsal is also subluxed on the right foot. I have warned him today that he cannot consider wearing a prosthesis for at least a month but he can go for measurements. He is going to need to keep the right foot padded is much as possible in his diabetic shoe indefinitely READMISSION 06/12/2019 Mr. Schuknecht is a type II diabetic on dialysis. He has been in this clinic multiple times with wounds on his bilateral lower extremities. Most recently here at the beginning of this year for 3 months with a wound on his left BKA amputation site which we managed to get  to close over. He also has a history of wounds on his right foot but tells me that everything is going well here. He actually came in here with his prosthesis walking with a cane. We were all really quite gratified to see this He states over the last several weeks he has had a painful area at the tip of the left third finger. His dialysis shunt in his is in the left upper arm and this particularly hurts during dialysis when his fingers get numb and there is a lot of pain in the tip of his left third finger. I believe he is seen vascular surgery. He had a Doppler done to evaluate for a dialysis steal syndrome. He is going for a banding procedure 1 week from today towards the left AV fistula. I think if that is unsuccessful they will be looking at creating a new shunt. He had a course of doxycycline when he which he finished about a week ago. He has been using Bactroban to the finger 6/25; the patient had his banding procedure and things seem to be going better. He is not having pain in his hand at dialysis. The area on the tip of his finger seems about closed. He did however have a paronychia I the last time I saw him. I have been advising him to use topical Bactroban washing the finger. Culture I did last time showed a few staph epidermidis which I was willing to dismiss as superficial skin contaminant. He arrives today with the finger wound looking better however the paronychia a seems worse 7/9; 2-week follow-up. He does not have an open wound on the tip of his third left finger. I thought he had an initial ischemic wound on the tip of the finger as well as a paronychia a medially. All of this appears to be closed. 8/6-Patient returns after 3 weeks for follow-up and has a right second met head plantar callus with a  significant area of maceration hiding an ulcer ABI repeated today on right - 1.26 8/13-Patient returns at 1 week, the right second met head plantar wound appears to be slightly better,  it is surrounded by the callus area we are using silver alginate 8/20; the patient came back to clinic 2 weeks ago with a wound on the right second metatarsal head. He apparently told me he developed callus in this area but also went away from his custom shoes to a pair of running shoes. Using silver alginate. 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 There might be much we can do to offload this although the patient tells me he has a wheelchair that he is using to try and stay off the wound is much as possible 8/27; right second metatarsal head. Still small punched out area with thick callus and subcutaneous tissue around the wound. We have been using silver collagen. This is always been an issue with this man's wound especially on this foot 9/3; right second met head. Still the same punched-out area with thick callus and subcutaneous tissue around the wound removing the circumference demonstrates repetitively undermining area. I have removed all the subcutaneous tissue associated with this. We have been using silver collagen 9/15; right second metatarsal head. Same punched-out thick callus and subcutaneous tissue around the wound area. Still requiring debridement we have been using silver collagen I changed back to silver alginate X-ray last time showed no evidence of osteomyelitis. Culture showed Klebsiella and he was started and Keflex apparently just 4 days ago 9/24; right second metatarsal head. He is completed the antibiotics I gave him for 10 days. Same punched-out thick callus and subcutaneous tissue around the area. Still requiring debridement. We have been using silver alginate. The area itself looks swollen and this could be just [place] that comes on this area with walking on a prosthesis on the left however I think he needs an MRI and I am going to order that today. There is not an option to offload this further 10/1; right second plantar metatarsal head. MRI booked  for October 6. Generally looking better today using silver alginate 10/8; right second plantar metatarsal head. MRI that was done on 10/6 was negative for osteomyelitis noted prior amputations of the second and fourth toes at the [facility]. Prior amputation of the third toe to the base of the middle phalanx. Prior amputation of the fifth toe to the head of the metatarsal. 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 He does not recall about hearing about this. He did not have any pain but then again he has reduced sensation from diabetic neuropathy 10/15; right second plantar metatarsal head. Still in the same condition callus nonviable subcutaneous tissue which cleans up nicely with debridement but reforms by the next week. The patient tells me that he is offloading this is much as he can including taking his wheelchair to dialysis. We have been using silver alginate, changed to silver collagen today 10/22; right second plantar metatarsal head. Wound measures smaller but still thick callus around this wound. Still requiring debridement 11/5 right second plantar metatarsal head. Less callus around the wound circumference but still requiring debridement. There is still some undermining which I cleaned out the wound looks healthy but still considerable punched out depth with a relatively small circumference to the wound there is no palpable bone no purulent drainage 11/12; right second plantar metatarsal head. Small  wound with thick callus tissue around the wound and significant undermining. We have been using silver collagen after my continuous debridements of this area 11/19; patient arrives in today with purulent drainage coming out of the wound in the second third met head area on the right foot. Serosanguineous thick drainage. Specimen obtained for culture. 11/21/2019 on evaluation today patient appears to be doing well with regard to his foot ulcer. The  good news is is culture came back negative for any bacteria there was no growth. The other good news is his x-ray also appears to be doing well. The foot is also measuring much better than what it was. Overall I am very pleased with how things seem to be progressing. 12/22; patient was in [facility] for several weeks due to the death of a family member. He said he stayed off the wound using silver alginate. 01/03/2020. Patient has a small open area with roughly 0.3 cm of circumferential undermining. The orifice looks smaller. We have been using silver collagen. There is not a wound more aggressive way to offload this area as he has a prosthesis on the other leg 1/21; again the same small area is 2 weeks ago. We have been using silver collagen I change that to endoform today I really believe that this is an offloading issue 2/4; the area on the right third met head. We have been using endoform. 2/11 the area on the right third met head we have been using silver alginate. 2/25; the area on the right third met head. There is much less callus on this today. The patient states he is offloading this even more aggressively. As an example he is taking his wheelchair into dialysis on most days 3/4; right third metatarsal head. Again he has eschar over the surface of the wound which I removed with a #3 curette. Some subcutaneous debris. This has 0.8 cm of direct probing depth. I cannot feel any bone here. I did do a culture the area 3/11; right third metatarsal head. Again an eschared area over the wound which almost makes this look superficial. Again debridement reveals a wound with probing depth probably not much different from last week there is no palpable bone no palpable purulence. The patient tells me that he is making every effort to offload this area properly. He is taking wheelchair into dialysis etc. There is no option for forefoot offloading or a total contact cast. We used Oasis #1  today 3/18; again callus and eschar over the wound surface. I remove this but still a probing wound although only 3 mm in depth this time. This is an improvement 3/25; again callus and eschar over the wound surface which I removed the wound is much deeper today at 0.7 cm. There is no palpable bone. Because of the appearance of this a culture was done. I am not going to put the Oasis back in this. Concerned about underlying infection. Notable for the fact that he is just finished doxycycline for the last go round of this 3/30; still 0.7 cm in depth which is disappointing. Culture I gave last time showed a few Staph aureus which is methicillin susceptible. This should have been taken care of by the recent course of doxycycline I gave him. We are using silver alginate 4/6; almost 1 cm in depth. We have been using silver alginate with underlying Bactroban. 4/15; about 1 cm in depth still. There is no palpable bone but there is undermining thick surface again as usual.  Our intake nurse noted what looked to be purulent drainage I suppose that could have been Bactroban but I did a culture for CandS 4/20; depth down 0.7 cm. Culture from last time was negative we have been using Bactroban and silver alginate 4/29; depth down 0.5 cm. Using silver alginate. He reports some drainage 5/6; comes in with the wound worse. Wound is deeper and tunnels. Measures 0.9 depth and an undermining area of 1.8 cm. 5/18; there is a change the dressing last week. Still using Sorbact hydrogel. What I can see of the base of the wound looks better. 6/1. Not much change. I changed him to endoform today. He has 1.1 cm in depth no palpable bone 6/8; again a small punched out wound with significant undermining. Culture I did last week was negative. T oday he had some purulent drainage perhaps mixed with some retained endoform. 6/22; MRI report below ooIMPRESSION: Skin ulceration on the plantar surface of the foot at the level  of the head of the second metatarsal. Mild marrow edema in the head of the second metatarsal could be reactive or due to osteomyelitis. Thin fluid collection surrounding the head and neck of the second metatarsal appears to communicate with the patient's skin wound and is worrisome for a septic collection. Advanced first MTP osteoarthritis. Prior amputations as noted above. Electronically Signed By: Inge Rise M.D. On: 06/16/2020 11:51 PCR culture I did last time I believe showed peptostreptococcus. There was no comment on any other gram-negative or gram-positive organisms. I gave him a course of Flagyl which I think he is finishing up. We are not making any progress with the wound over the right second/third metatarsal head. We are using silver alginate putting him in kerlix Coban 6/29; has his appointment with Dr. [name] of orthopedics next Tuesday I think we see him on the same day. He was in for a nurse visit today. Nurse noted undermining of skin and callus so I was asked to look at this. We have been using silver alginate and kerlix Coban 7/6; patient saw Dr. [name] PA today will see Dr. [name] on Tuesday. The major question is does he needs surgery to clean out the area in question culture question resect any involved bone etc. Otherwise he will need to see infectious disease for broad-spectrum antibiotics given at dialysis 7/13; I talked to Dr. [name] late last week. He was not impressed with the MRI results thinking that the bone changes may have been reactive rather than osteomyelitis. He did not think he needed drainage of an abscess thinking of the fluid we are seeing was superficial and contiguous with the actual wound. He did not think he needed surgery or resection. Recommended consideration of infectious disease and return him to our care. We have been using silver alginate Objective Constitutional Vitals Time Taken: 1:41 PM, Height: 73 in, Weight: 202 lbs, BMI: 26.6,  Temperature: 98.4 F, Pulse: 84 bpm, Respiratory Rate: 18 breaths/min, Blood Pressure: 95/59 mmHg, Capillary Blood Glucose: 130 mg/dl. Integumentary (Hair, Skin) Wound #41 status is Open. Original cause of wound was Gradually Appeared. The wound is located on the Right Metatarsal head second. The wound measures 0.3cm length x 0.4cm width x 0.7cm depth; 0.094cm^2 area and 0.066cm^3 volume. There is Fat Layer (Subcutaneous Tissue) Exposed exposed. There is no tunneling noted, however, there is undermining starting at 12:00 and ending at 12:00 with a maximum distance of 0.6cm. There is a small amount of serosanguineous drainage noted. The wound margin is thickened. There  is medium (34-66%) red granulation within the wound bed. There is a medium (34-66%) amount of necrotic tissue within the wound bed including Adherent Slough. 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 Dressing Change Frequency: Wound #41 Right Metatarsal head second: Do not change entire dressing for one week. Skin Barriers/Peri-Wound Care: Moisturizing lotion Wound Cleansing: May shower with protection. - patient to use a cast protector. Primary Wound Dressing: Wound #41 Right Metatarsal head second: Calcium Alginate with Silver Secondary Dressing: Wound #41 Right Metatarsal head second: Dry Gauze Other: - felt to offload periwound. Edema Control: Kerlix and Coban - Right Lower Extremity Avoid standing for long periods of time Elevate legs to the level of the heart or above for 30 minutes daily and/or when sitting, a frequency of: - throughout the day Additional Orders / Instructions: Other: - minimize walking and standing on foot to aid in wound  healing and callus buildup. Laboratory ordered were: Anerobic culture PCR - non healing wound #1 continue with silver alginate as the primary dressing 2. I repeated the PCR culture after an aggressive debridement. My goal here will be to treat anything we obtain here with a prolonged course of antibiotics either at dialysis or oral 3. Dr. [name] did not feel he needed surgery at this point. He went over the MRI with the attending radiologist of the day they both agree that the changes were reactive. He did not think he needed abscess drainage 4. At this point my plan is to respond to the PCR culture with antibiotics myself and keep infectious disease consultation in reserve Electronic Signature(s) Signed: 07/08/2020 5:16:39 PM By: Linton Ham MD Entered By: Linton Ham on 07/08/2020 14:29:05 -------------------------------------------------------------------------------- SuperBill Details Patient Name: Date of Service: Kateri Plummer 07/08/2020 Medical Record Number: [id number] Patient Account Number: [id number] Date of Birth/Sex: Treating RN: 1951-07-23 (68 y.o. [name] Primary Care Provider: [name] Other Clinician: Referring Provider: Treating Provider/Extender: [name] in Treatment: 56 Diagnosis Coding ICD-10 Codes Code Description E11.621 Type 2 diabetes mellitus with foot ulcer L97.512 Non-pressure chronic ulcer of other part of right foot with fat layer exposed I70.298 Other atherosclerosis of native arteries of extremities, other extremity L03.115 Cellulitis of right lower limb Physician Procedures : CPT4 Code Description Modifier [id number] - WC PHYS LEVEL 3 - EST PT ICD-10 Diagnosis Description E11.621 Type 2 diabetes mellitus with foot ulcer L97.512 Non-pressure chronic ulcer of other part of right foot with fat layer exposed Quantity: 1 Electronic Signature(s) Signed: 07/08/2020 5:16:39 PM By: Linton Ham  MD Entered By: Linton Ham on 07/08/2020 14:29:30
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
## 2020-07-15 ENCOUNTER — Encounter (HOSPITAL_BASED_OUTPATIENT_CLINIC_OR_DEPARTMENT_OTHER): Payer: Medicare Other | Admitting: Internal Medicine

## 2020-07-15 DIAGNOSIS — L03115 Cellulitis of right lower limb: Secondary | ICD-10-CM | POA: Diagnosis not present

## 2020-07-15 DIAGNOSIS — E1140 Type 2 diabetes mellitus with diabetic neuropathy, unspecified: Secondary | ICD-10-CM | POA: Diagnosis not present

## 2020-07-15 DIAGNOSIS — Z89512 Acquired absence of left leg below knee: Secondary | ICD-10-CM | POA: Diagnosis not present

## 2020-07-15 DIAGNOSIS — E1122 Type 2 diabetes mellitus with diabetic chronic kidney disease: Secondary | ICD-10-CM | POA: Diagnosis not present

## 2020-07-15 DIAGNOSIS — I132 Hypertensive heart and chronic kidney disease with heart failure and with stage 5 chronic kidney disease, or end stage renal disease: Secondary | ICD-10-CM | POA: Diagnosis not present

## 2020-07-15 DIAGNOSIS — I509 Heart failure, unspecified: Secondary | ICD-10-CM | POA: Diagnosis not present

## 2020-07-15 DIAGNOSIS — E11621 Type 2 diabetes mellitus with foot ulcer: Secondary | ICD-10-CM | POA: Diagnosis not present

## 2020-07-15 DIAGNOSIS — Z880 Allergy status to penicillin: Secondary | ICD-10-CM | POA: Diagnosis not present

## 2020-07-15 DIAGNOSIS — Z7902 Long term (current) use of antithrombotics/antiplatelets: Secondary | ICD-10-CM | POA: Diagnosis not present

## 2020-07-15 DIAGNOSIS — N189 Chronic kidney disease, unspecified: Secondary | ICD-10-CM | POA: Diagnosis not present

## 2020-07-15 DIAGNOSIS — E1151 Type 2 diabetes mellitus with diabetic peripheral angiopathy without gangrene: Secondary | ICD-10-CM | POA: Diagnosis not present

## 2020-07-15 DIAGNOSIS — I70208 Unspecified atherosclerosis of native arteries of extremities, other extremity: Secondary | ICD-10-CM | POA: Diagnosis not present

## 2020-07-15 DIAGNOSIS — Z992 Dependence on renal dialysis: Secondary | ICD-10-CM | POA: Diagnosis not present

## 2020-07-15 DIAGNOSIS — L97512 Non-pressure chronic ulcer of other part of right foot with fat layer exposed: Secondary | ICD-10-CM | POA: Diagnosis not present
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
## 2020-07-18 NOTE — Progress Notes (Signed)
[name], [name] ([id number]) Visit Report for 07/15/2020 HPI Details Patient Name: Date of Service: [name], [name] 07/15/2020 2:30 PM Medical Record Number: [id number] Patient Account Number: [id number] Date of Birth/Sex: Treating RN: 09/27/1951 (69 y.o. M) Epps, Carrie Primary Care Provider: Jones, [name] Other Clinician: Referring Provider: Treating Provider/Extender: [name] [name] Weeks in Treatment: 57 History of Present Illness Location: Patient presents with a wound to bilateral feet. Quality: Patient reports experiencing essentially no pain. Severity: Mildly severe wound with no evidence of infection Duration: Patient has had the wound for greater than 2 weeks prior to presenting for treatment HPI Description: The patient is a pleasant 69 yrs old bm here for evaluation of ulcers on the plantar aspect of both feet. 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 He has a left arm graft for access. He has been trying to stay off his feet for weeks but does not seem to have any improvement in the wound. He has been seeing someone at the foot center and was referred to the Wound Care center for further evaluation. 12/30/15 the patient has 3 wounds one over the right first metatarsal head and 2 on the left foot at the left fifth and lleft first metatarsal head. All of these look relatively similar. The one over the left fifth probe to bone I could not prove that any of the others did. I note his MRI in November that did not show osteomyelitis. His peripheral pulses seem robust. All of these underwent surgical debridement to remove callus nonviable skin and subcutaneous tissue 01/06/16; the patient had his sutures removed from the right fifth ray amputation. There may be a small open part of this superiorly but otherwise the incision looks good. Areas over his right first, left first and left fifth metatarsal head all  underwent surgical debridement as a varying degree of callus, skin and nonviable subcutaneous tissue. The area that is most worrisome is the right fifth metatarsal head which has a wound probes precariously close to bone. There is no purulent drainage or erythema 01/13/16; I'm not exactly sure of the status of the right fifth ray amputation site however he follows with Dr. Graves later this week. The area over his right first plantar metatarsal head, left first and fifth plantar metatarsal head are all in the same status. Thick circumferential callus, nonviable subcutaneous tissue. Culture of the left fifth did not culture last week 01/20/16; all of the patient's wounds appear and roughly the same state although his amputation site on the right lateral foot looks better. His wounds over the right first, left first and left fifth metatarsal heads all underwent difficult surgical debridement removing circumferential callus nonviable skin and subcutaneous tissue. There is no overt evidence of infection in these areas. MRI at the end of December of the left foot did not show osteomyelitis, right foot showed osteomyelitis of the right fifth digit he is now status post amputation. 01/27/16 the patient's wounds over his plantar first and fifth metatarsal heads on the left all appear better having been started on a total contact cast last week. They were debridement of circumferential callus and nonviable subcutaneous tissue as was the wound over the first metatarsal head. His surgical incision on the right also had some light surface debridement done. 03/23/2016 -- the patient was doing really well and most of his wounds had almost completely healed but now he came back today with a history of having   a discharge from the area of his right foot on the plantar aspect and also between his first and second toe. He also has had some discharge from the left foot. Addendum: I spoke to the PA [name] at the  dialysis center whose fax number is [phone]. We discussed the infection the patient has and she will put the patient on vancomycin and Fortaz until the final culture report is back. We will fax this as soon as available. 03/30/2016 -- left foot x-ray IMPRESSION:No definitive osteomyelitis noted. X-ray of the right foot -- IMPRESSION: 1. Soft tissue swelling. Prior amputation right fifth digit. No acute or focal bony abnormality identified. If osteomyelitis remains a clinical concern, MRI can be obtained. 2. Peripheral vascular disease. His culture reports have grown an MSSA -- and we will fax this report to his hemodialysis center. He was called in a prescription of oral doxycycline but I have told her not to fill this in as he is already on IV antibiotics. 04/06/2016 -- a few days ago, I spoke to the hemodialysis center nurse who had stopped the IV antibiotics and he was given a prescription for doxycycline 100 mg by mouth twice a day for a week and he is on this at the present time. 05/11/2016 -- he has recently seen his PCP this week and his hemoglobin A1c was 7. He is working on his paperwork to get his orthotic shoes. 05/25/2016 -- -- x-ray of the left foot IMPRESSION:No acute bony abnormality. No radiographic changes of acute osteomyelitis. No change since prior study. X-ray of the right foot -- IMPRESSION: Postsurgical changes are seen involving the fifth toe. No evidence of acute osteomyelitis. he developed a large blister on the medial part of his right foot and this opened out and drain fluid. 06/01/2016 -- he has his MRI to be done this afternoon. 06/15/2016 -- MRI of the left forefoot without contrast shows 2 separate regions of cutaneous and subcutaneous edema and possible ulceration and blistering along the ball of the foot. No obvious osteomyelitis identified. MRI of the right foot showed cutaneous and subcutaneous thickening plantar to the first digit sesamoid with an ulcer  crater but no underlying osteomyelitis is identified. 06/22/2016 -- the right foot plantar ulcer has been draining a lot of seropurulent material for the last few days. 06/30/2016 -- spoke to the dialysis center and I believe I spoke to [place] a PA at the center who discussed with me and agreed to putting Legrand Como on vancomycin until his cultures arrive. On review of his culture report no WBCs were seen or no organisms were seen and the culture was reincubated for better growth. The final report is back and there were no predominant growth including Streptococcus or Staphylococcus. Clinically though he has a lot of drainage from both wounds a lot of undermining and there is further blebs on the left foot towards the interspace between his first and second toe. 08/10/2016 -- the culture from the right foot showed normal skin flora and there was no Staphylococcus aureus was group A streptococcus isolated. 09/21/2016 -- -- MRI of the right foot was done on 09/13/2016 - IMPRESSION: Findings most consistent with acute osteomyelitis throughout the great toe, sesamoid bones and plantar aspect of the head of the first metatarsal. Fluid in the sheath of the flexor tendon of the great toe could be sympathetic but is worrisome for septic tenosynovitis. First MTP joint effusion worrisome for septic joint. He was admitted to the hospital on 09/11/2016  and treated for a fever with vancomycin and cefepime. He was seen by Dr. Bobby Rumpf of infectious disease who recommended 6 weeks treatment with vancomycin and ceftazidime with his hemodialysis and to continue to see as in the wound clinic. Vascular consult was pending. The patient was discharged home on 09/14/2016 and he would continue with IV antibiotics for 6 weeks. 09/28/16 wound appears reasonably healthy. Continuing with total contact cast 10/05/16 wound is smaller and looking healthy. Continue with total contact cast. He continues on IV  antibiotics 10/12/2016 -- he has developed a new wound on the dorsal aspect of his left big toe and this is a superficial injury with no surrounding cellulitis. He has completed 30 days of IV antibiotics and is now ready to start his hyperbaric oxygen therapy as per his insurance company's recommendation. 10/19/2016 -- after the cast was removed on the right side he has got good resolution of his ulceration on the right plantar foot. he has a new wound on the left plantar foot in the region of his fourth metatarsal and this will need sharp debridement. 10/26/2016 -- Xray of the right foot complete: IMPRESSION: Changes consistent with osteomyelitis involving the head of the first metatarsal and base of the first proximal phalanx. The sesamoid bones are also likely involved given their positioning. 11/02/2016 -- there still awaiting insurance clearance for his hyperbaric oxygen therapy and hopefully he will begin treatment soon. 11/09/2016 -- started with hyperbaric oxygen therapy and had some barotrauma to the right ear and this was seen by ENT who was prescribed Afrin drops and would probably continue with HBO and he is scheduled for myringotomy tubes on Friday 11/16/2016 -- he had his myringotomy tubes placed on Friday and has been doing much better after that with some fluid draining out after hyperbaric treatment today. The pain was much better. 11/30/2016 -- over the last 2 days he noticed a swelling and change of color of his right second toe and this had been draining minimal fluid. 12/07/2016 -- x-ray of the right foot -- IMPRESSION:1. Progressive ulceration at the distal aspect of the second digit with significant soft tissue swelling and osseous changes in the distal phalanx compatible with osteomyelitis. 2. Chronic osteomyelitis at the first MTP joint. 3. Ulcerations at the second and third toes as well without definite osseous changes. Osteomyelitis is not excluded. 4. Fifth digit  amputation. On 12/01/2016 I spoke to Dr. Bobby Rumpf, the infectious disease specialist, who kindly agreed to treat this with IV antibiotics and he would call in the order to the dialysis center and this has been discussed in detail with the patient who will make the appropriate arrangements. The patient will also book an appointment as soon as possible to see Dr. [name] in the office. Of note the patient has not been on antibiotics this entire week as the dialysis center did not receive any orders from Dr. [name]. I got in touch with Dr. [name] who tells me the patient has an appointment to see him this coming Wednesday. 12/14/2016 -- the patient is on ceftazidime and vancomycin during his dialysis, and I understand this was put on by his nephrologist Dr. Raliegh Ip possibly after speaking with infectious disease Dr. [name] 12/23/16; patient was on my schedule today for a wound evaluation as he had difficulties with his schedule earlier this week. I note that he is on vancomycin and ceftazidine at dialysis. In spite of this he arrives today with a new wound on the base of the  right second toe this easily probes to bone. The known wound at the tip of the second toe With this area. He also has a superficial area on the medial aspect of the third toe on the side of the DIP. This does not appear to have much depth. The area on the plantar left foot is a deep area but did not probe the bone 12/28/16 -- he has brought in some lab work and the most recent labs done showed a hemoglobin of 12.1 hematocrit of 36.3, neutrophils of 51% WBC count of 5.6, BN of 53, albumin of 4.1 globulin of 3.7, vancomycin 13 g per mL. 01/04/2017 -- he saw Dr. [name] who has recommended a amputation of the right second toe and he is awaiting this date. He sees Dr. Bobby Rumpf of infectious disease tomorrow. The bone culture taken on 12/28/2016 had no growth in 2 days. 01/11/2017 -- was seen by Dr. Bobby Rumpf regarding the  management and has recommended a eval by vascular surgeons. He recommended to continue the antibiotics during his hemodialysis. Xray of the left foot -- IMPRESSION: No acute fracture, dislocation, or osseous erosion identified. 01/18/2017 -- he has his vascular workup later today and he is going to have his right foot second toe amputation this coming Friday by Dr. [name]. We have also put in for a another 30 treatments with hyperbaric oxygen therapy 01/25/2017 -- I have reviewed Dr. Donnetta Hutching is vascular report from last week where he reviewed him and thought his vascular function was good enough to heal his amputation site and no further tests were recommended. He also had his orthopedic related to surgery which is still pending the notes and we will review these next week. 02/01/2017 -- the operative remote of Dr. 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 02/08/2017 --he was seen by Dr. [name] on 02/03/2017 -- patient is on Jane. after a thorough review he had recommended to continue with antibiotics during hemodialysis. The patient was seen by Dr. [name], but I do not find any follow-up note on the electronic medical record. he has removed the dressing over the right foot to remove the sutures and asked him to see him back in a month's time 02/22/2017 -- patient has been febrile and has been having symptoms of the upper respiratory tract infection but has not been checked for the flu and it's been over 4 days now. He says he is feeling better today. Some drainage between his left first and second toe and this needed to be looked  at 03/08/2017 -- was seen by Dr. Bobby Rumpf on 03/07/2017 after review he will stop his antibiotics and see him back in a month to see if he is feeling well. 03/15/2017 -- he has completed his course of hyperbaric oxygen therapy and is doing fine with his health otherwise. 03/22/2017 -- his nutritionist at the dialysis center has recommended a protein supplement to help build his collagen and we will prescribe this for him when he has the details 05/03/2017 -- he recently noticed the area on the plantar aspect of his fifth metatarsal head which opened out and had minimal drainage. 05/10/2017 -- -- x-ray of  the left foot -- IMPRESSION:1. No convincing conventional radiographic evidence of active osteomyelitis.2. Active soft tissue ulceration at the tip of the fifth digit, and at the lateral aspect of the foot adjacent the base of the fifth metatarsal. 3. Surgical changes of prior fourth toe amputation. 4. 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 5. Small vessel atherosclerotic vascular calcifications. 6. 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 ======= Readmission after 5 weeks: 06/15/2017 -- the patient returns after 5 weeks having had an MRI done on 05/17/2017 MRI of the left foot without contrast showed soft tissue ulcer overlying the base of the fifth metatarsal. 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 No drainable fluid collection to suggest an abscess. He was in hospital between 05/16/2017 and 05/25/2017 -- and on discharge was asked to follow-up with Dr. Graves and Dr. Hatcher. He was treated 2 weeks post discharge with vancomycin plus ceftriaxone with hemodialysis and oral metronidazole 500 mg 3 times a day. 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 last hemoglobin A1c was  6.7% He had a postoperative MR of the foot on 05/23/2017 -- which showed no new areas of cortical bone loss, edema or soft tissue ulceration to suggest osteomyelitis. the patient has completed his course of IV antibiotics during dialysis and is to see the infectious disease doctor tomorrow. Dr. Graves had seen him after suture removal and asked him to keep the wound with a dry dressing 06/21/2017 -- he was seen by Dr. Jeffrey Hatcher of infectious disease on 06/16/2017 -- he stopped his Flagyl and will see him back in 6 weeks. He was asked to follow-up with Dr. Graves and with the wound clinic clinic. 07/05/17; bone biopsy from last time showed acute osteomyelitis. This is from the reminiscent in the left fifth metatarsal. Culture result is apparently still pending [holding for anaerobe}. From my understanding in this case this is been a progressive necrotic wound which is deteriorated markedly over the last 3 weeks since he returned here. He now has a large area of exposed bone which was biopsied and cultured last week. Dr. Snyder has put him on vancomycin and Fortaz during his hemodialysis and Flagyl orally. He is to see Dr. Graves next week 07/19/2017 -- the patient was reviewed by Dr. Graves of orthopedics who reviewed the case in detail and agreed with the plan to continue with IV antibiotics, aggressive wound care and hyperbaric oxygen therapy. He would see him back in 3 weeks' time 08/09/2017 -- saw Dr. Jeffrey Hatcher on 08/03/2017 -he was restarted on his antibiotics for 6 weeks which included vanco, ceftaz and Flagyl. he recommended continuing his antibiotics for 6 weeks and reevaluate his completion date. He would continue with wound care and hyperbaric oxygen therapy. 08/16/2017 -- I understand he will be completing his 6 weeks of antibiotics sometime later this week. 09/19/2017 -- he has been without his wound VAC for the last week due to lack of supplies. He has been packing his wound  with silver alginate. 10/04/2017 -- he has an appointment with Dr. Hatcher tomorrow and hence we will not apply the wound VAC after his dressing changes today. 10/11/2017 -- he was seen by Dr. Jeffrey Hatcher on 10/05/2017, noted that the patient was currently off antibiotics, and after thorough review he recommended he follow-up with the wound care as   per plan and no further antibiotics at this point. 11/29/17; patient is arrived for the wound on his left lateral foot.. He is completed IV antibiotics and 2 rounds of hyperbaric oxygen for treatment of underlying osteomyelitis. He arrives today with a surface on most of the wound area however our intake nurse noted drainage from the superior aspect. This was brought to my attention. 12/13/2017 -- the right plantar foot had a large bleb and once the bleb was opened out a large callus and subcutaneous debris was removed and he has a plantar ulcer near the fourth metatarsal head 12/28/17 on evaluation today patient appears to be doing acutely worse in regard to his left foot. The wound which has been appearing to do better as now open up more deeply there is bone palpable at the base of the wound unfortunately. He tells me that this feels "like it did when he had osteomyelitis previously" he also noted that his second toe on the left foot appears to be doing worse and is swollen there does appear to be some fluid collected underneath. His right foot plantar ulcer appears to be doing somewhat better at this point and there really is no complication at the site currently. No fevers, chills, nausea, or vomiting noted at this time. Patient states that he normally has no pain at this site however. T oday he is having significant pain. 01/03/18; culture done last week showed methicillin sensitive staph aureus and group B strep. He was on Septra however he arrives today with a fever of 101. He has a functional dialysis shunt in his left arm but has had no pain here.  No cough. He still makes some urine no dysuria he did have abdominal pain nausea and diarrhea over the weekend but is not had any diarrhea since yesterday. Otherwise he has no specific complaints 01/05/18; the patient returns today in follow-up for his presentation from 1/8. At that point he was febrile. I gave him some Levaquin adjusted for his dialysis status. He tells me the fever broke that night and it is not likely that this was actually a wound infection. He is gone on to have an MRI of the left foot; this showed interval development of an abnormal signal from the base of the third and fourth metatarsal and in the cuboid reminiscent consistent with osteomyelitis. There is no mention of the left second toe I think which was a concern when it was ordered. The patient is taking his Levaquin 01/10/18; the patient has completed his antibiotics today. The area on the left lateral foot is smaller but it still probes easily to bone. He has underlying osteomyelitis here and sees Dr. Campbell of infectious disease this week The area on the right third plantar metatarsal head still requires debridement not much change in dimensions He has a new wound on the medial tip of his right third toe again this probes to bone. He only noticed this 2 days ago 01/17/18; the patient saw Dr. Hatcher last week and he is back on Vanco and Fortaz at dialysis. 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 We are previously seeing him for a wound also on the base of the fourth med head on the right. X-ray of this area did not show osteomyelitis in relation to a new wound on the tip of his right third toe. Again this probes   to bone. Finally his second toe on the left which didn't really show anything on the MRI at least no report appears to have a separated cutaneous area which I think is going to come right off the tip  of his toe and leave exposed bone. Whether this is infectious or ischemic I am not clear Culture I did from the third toe last week which was his new wound was negative. Plain x-ray on the right foot did not show osteomyelitis in the toes 01/24/18; the patient is going went to see Dr. Graves. He is going to have a amputation of the left second and right third toe although paradoxically the left second toe looks better than last week. We have treating a deep probing wound on the left lateral foot and the area over the third metatarsal head on the right foot. 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 Also a debridement including bone of the left lateral foot and a closure which is still sutured. This is a bit surprising. Also apparent debridement of the base of the right third toe wound. 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 I'm not sure exactly where we are in the frame of things here. The wound on the left lateral foot is still sutured also the amputation sites of the left second and right third toe. He is been using Xeroform to the sutured areas silver alginate to the right foot 02/14/18; he continues on Rocephin at dialysis for osteomyelitis. I still don't have a good sense of where we are in the treatment duration. The wounds on the left lateral foot had the sutures removed and this clearly still probes deeply. We debrided the area with a #5 curet. We'll use silver alginate to the wound the area over the right third met head also required debridement of callus skin and subcutaneous tissue and necrotic debris over the wound surface. This tunnels superiorly but I did not unroofed this today. The areas on the tip of his toes have a crusted surface eschar I did not  debridement this either. 02/21/18; he continues on Rocephin at dialysis for osteomyelitis. I don't have a good sense of where we are and the treatment duration. The wounds on the left lateral foot is callused over on presentation but requires debridement. The area on the right third med head plantar aspect actually is measuring smaller 02/28/18;patient is on Vanco and Fortaz at dialysis, I'm not sure why I thought this was Rocephin above unless it is been changed. I don't have a good sense of time frame here. Using silver collagen to the area on the left lateral foot and right third med head plantar foot 03/07/18; patient is completing bank and Fortaz at dialysis soon. He is using Silver collagen to the area on the left lateral foot and the right third metatarsal head. He has a smaller superficial wound distal to the left lateral foot wound. 03/14/18-he is here in follow-up evaluation for multiple ulcerations to his bilateral feet. He presents with a new ulceration to the plantar aspect of the left foot underneath the blister, this was deroofed to reveal a partial thickness ulcer. He is voicing no complaints or concerns, tolerated dialysis yesterday. We will continue with same treatment plan and he will   follow-up next week 03/21/18; the patient has a area on the lateral left foot which still has a small probing area. The overall surface area of the wound is better. He presented with a new ulceration on the left foot plantar fifth metatarsal head last week. The area on the right third metatarsal head appears smaller.using silver alginate to all wound areas. The patient is changing the dressing himself. He is using a [place] forefoot offloading are on the right and a healing sandal on the left 03/28/18; original wound left lateral foot. Still nowhere close to looking like a heeling surface secondary wound on the left lateral foot at the base of his question fifth metatarsal which was new last week Right third  metatarsal head. We have been using silver alginate all wounds 04/04/18; the original wound on the left lateral foot again heavy callus surrounding thick nonviable subcutaneous tissue all requiring debridement. The new wound from 2 weeks ago just near this at the base of the fifth metatarsal head still looks about the same Unfortunately there is been deterioration in the third medical head wound on the right which now probes to bone. I must say this was a superficial wound at one point in time and I really don't have a good frame of reference year. I'll have to go back to look through records about what we know about the right foot. 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 He is also been to see Dr. Graves. Somewhere in here as somebody is ordered a VASCULAR evaluation which was done on 03/22/18. On the right is anterior tibial artery was monophasic triphasic at the posterior tibial artery. On the left anterior tibial artery monophasic posterior tibial artery monophasic. ABI in the right was 1.43 on the left 1.21. TBIs on the right at 0.41 and on the left at 0.32. A vascular consult was recommended and I think has been arranged. 04/11/18; Mr. Peggs has not had an MRI of the right foot since 2017. Recent x-ray of the right foot done in January and February was negative. However he has had a major deterioration in the wound over the third met head. He is completed antibiotics last week at dialysis Vanco and Fortaz. I think he is going to see vascular in follow-up. The areas on the left lateral foot and left plantar fifth metatarsal head both look satisfactory. I debrided both of these areas although the tissue here looks good. The area on the left lateral foot once probe to bone it certainly does not do that now 04/18/18; MRI of the right foot is on Thursday. He has what looks to be serosanguineous purulent  drainage coming out of the wound over the right third metatarsal head today. He completed antibiotics bank and Fortaz 2 weeks ago at dialysis. He has a vascular follow-up with regards to his arterial insufficiency although I don't exactly see when that is booked. The areas on the left foot including the lateral left foot and the plantar left fifth metatarsal head look about the same. 04/25/18-He is here in follow-up evaluation for bilateral foot ulcers. MRI obtained was negative for osteomyelitis. Wound culture was negative. We will continue with same treatment plan he'll follow-up next week 05/02/18; the right plantar foot wound over the third metatarsal head actually looks better than when I last saw this. His MRI was negative for osteomyelitis wound culture was negative. On the left plantar   foot both wounds on the plantar fifth metatarsal head and on the lateral foot both are covered in a very hard circumferential callus. 05/09/18; right plantar foot wound over the third metatarsal head stable from last week. Left plantar foot wound over the fifth metatarsal head also stable but with callus around both wound areas The area on the left lateral foot had thick callus over a surface and I had some thoughts about leaving this intact however it felt boggy. We've been using silver alginate all wounds 05/16/18; since the patient was last here he was hospitalized from 5/15 through 5/19. 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 This grew MRSA. He was placed on vancomycin.MRI of the left foot suggested "progressive" osteomyelitis and bone destruction of the cuboid and the base of the fifth metatarsal. Stable erosive changes of the base of the second metatarsal. I'm wondering if they're aware that he had surgery and debridement in the area of the underlying bone previously by Dr. Graves. In any case, He was  also revascularized with an angioplasty and stenting of the left tibial peroneal trunk and angioplasty of the left posterior tibial artery. He is on vancomycin at dialysis. He has a 2 week follow-up with infectious disease. He has vascular surgery follow-up. He was started on Plavix. 05/23/18; the patient's wound on the lateral left foot at the level of the fifth metatarsal head and the plantar wound on the plantar fifth metatarsal head both look better. The area on the right third metatarsal head still has depth and undermining. We've been using silver alginate. He is on vancomycin. He has been revascularized on the left 05/30/18; he continues on vancomycin at dialysis. Revascularized on the left. The area on the left lateral foot is just about closed. Unfortunately the area over the plantar fifth metatarsal head undermining superiorly as does the area over the right third metatarsal head. Both of these significantly deteriorated from last week. He has appointments next week with infectious disease and orthopedics I delayed putting on a cast on the left until those appointments which therefore we made we'll bring him in on Friday the 14th with the idea of a cast on the left foot 06/06/18; he continues on vancomycin at dialysis. Revascularized on the left. He arrives today with a ballotable swelling just above the area on the left lateral foot where his previous wound was. He has no pain but he is reasonably insensate. The difficult areas on the plantar left fifth metatarsal head looks stable whereas the area on the right third plantar metatarsal head about the same as last week there is undermining here although I did not unroofed this today. He required a considerable debridement of the swelling on the left lateral foot area and I unroofed an abscess with a copious amount of brown purulent material which I obtained for culture. I don't believe during his recent hospitalization he had any further imaging  although I need to review this. Previous cultures from this area done in this clinic showed MRSA, I would be surprised if this is not what this is currently even though he is on vancomycin. 06/13/18; he continues on vancomycin at dialysis however he finishes this on Sunday and then graduates the doxycycline previously prescribed by Dr. Hatcher. The abscess site on the lateral foot that I unroofed last time grew moderate amounts of methicillin-resistant staph aureus that is both   vancomycin and tetracycline sensitive so we should be okay from that regard. He arrives today in clinic with a connection between the abscess site and the area on the lateral foot. We've been using silver alginate to all his wound areas. The right third metatarsal head wound is measuring smaller. Using silver alginate on both wound areas 06/20/18; transitioning to doxycycline prescribed by Dr. Hatcher. The abscess site on the lateral foot that I unroofed 2 weeks ago grew MRSA. That area has largely closed down although the lateral part of his wound on the fifth metatarsal head still probes to bone. I suspect all of this was connected. On the right third metatarsal head smaller looking wound but surrounded by nonviable tissue that once again requires debridement using silver alginate on both areas 06/27/18; on doxycycline 100 twice a day. The abscess on the lateral foot has closed down. Although he still has the wound on the left fifth metatarsal head that extends towards the lateral part of the foot. The most lateral part of this wound probes to bone Right third metatarsal head still a deep probing wound with undermining. Nevertheless I elected not to debridement this this week If everything is copious static next week on the left I'm going to attempt a total contact cast 07/04/18; he is tolerating his doxycycline. The abscess on the left lateral foot is closed down although there is still a deep wound here that probes to bone. We  will use silver collagen under the total contact cast Silver collagen to the deep wound over the right third metatarsal head is well 07/11/18; he is tolerating doxycycline. The left lateral fifth plantar metatarsal head still probes deeply but I could not probe any bone. We've been using silver collagen and this will be the second week under the total contact cast Silver collagen to deep wound over the right third metatarsal head as well 07/18/18; he is still taking doxycycline. The left lateral fifth plantar metatarsal this week probes deeply with a large amount of exposed bone. Quite a deterioration. He required extensive debridement. Specimens of bone for pathology and CNS obtained Also considerable debridement on the right third metatarsal head 07/25/18; bone for pathology last week from the lateral left foot showed osteomyelitis. CandS of the bone Enterobacter. And Klebsiella. He is now on ciprofloxacin and the doxycycline is stopped [Dr. Hatcher of infectious disease] area He has an appointment with Dr. Hatcher tomorrow I'm going to leave the total contact cast off for this week. May wish to try to reapply that next week or the week after depending on the wound bed looks. I'm not sure if there is an operative option here, previously is followed with Dr. Graves 08/01/18 on evaluation today patient presents for reevaluation. He has been seen by infectious disease, Dr. Hatcher, and he has placed the patient back on Ceftaz currently to be given to him at dialysis three times a week. 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 Fortunately there does not appear to be the evidence of infection at this point in time. Overall the patient has been tolerating the dressing changes without complication. We are no longer due lives in the cast of left foot secondary to the infection obviously. 08/10/18 on evaluation today patient appears to be  doing rather well all things considered in regard to his left plantar foot ulcer. He does have a significant callous on the lateral portion of his left   foot he wonders if I can help clean that away to some degree today. Fortunately he is having no evidence of infection at this time which is good news. No fevers chills noted. With that being said his right plantar foot actually does have a significant callous buildup around the wound opening this seems to not be doing as well as I would like at this point. I do believe that he would benefit from sharp debridement at this site. Subsequently I think a total contact cast would be helpful for the right foot as well. 08/17/18; the patient arrived today with a total contact cast on the right foot. This actually looks quite good. He had gone to see Dr. Megan Salon of infectious disease about the osteomyelitis on the left foot. I think he is on IV Fortaz at dialysis although I am not exactly sure of the rationale for the Fortaz at the time of this dictation. He also arrived in today with a swelling on the lateral left foot this is the site of his original wounds in fact when I first saw this man when he was under Dr. Ardeen Garland care I think this was the site of where the wound was located. It was not particularly tender however using a small scalpel I opened it to [place] some moderate amount of purulent drainage. 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 Last MRI was in May that showed progressive osteomyelitis and bone destruction of the cuboid and base of the fifth metatarsal. Last week he had an abscess over this same area this was removed. Culture was negative. We are continuing with a total contact cast to the area on the third metatarsal head on the right and making some good progress here. The patient asked me again about renal  transplant. He is not on the list because of the open wounds. 08/31/18; apparently the patient had an interruption in the [place] but he is now back on this at dialysis. Apparently there was a misunderstanding and Dr. Crissie Figures orders. Due to have the MRI of the left foot tonight. The area on the right foot continues to have callus thick skin and subcutaneous tissue around the wound edge that requires constant debridement however the wound is smaller we are using a total contact cast in this area. Alginate all wounds 09/07/18; without much surprise the MRI of his left foot showed osteomyelitis in the reminiscent of his fourth metatarsal but also the third metatarsal. She arrives in clinic today with the right foot and a total contact cast. There was purulent drainage coming out of the wound which I have cultured. Marked deterioration here with undermining widely around the wound orifice. Using pickups and a scalpel I remove callus and subcutaneous tissue from a substantial new opening. In a similar fashion the area on the left lateral foot that was blistered last week I have opened this and remove skin and subcutaneous tissue from this area to expose a obvious new wound. I think there is extension and communication between all of this on the left foot. The patient is on Fortaz at dialysis. Culture done of the right foot. We are clearly not making progress here. We have made him an appointment with Dr. [name] of orthopedics. I think an amputation of the left leg may be discussed. I don't think there is anything that can be done with foot salvage.  09/14/18; culture from the right foot last time showed a very resistant MRSA. This is resistant to doxycycline. I'm going to try to get linezolid at least for a week. He does not have a appointment with Dr. Graves yet. 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 He is finished Fortaz at dialysis. I'll send a message to Dr. Hatcher of  infectious disease. I want the patient to see Dr. Graves to go over the pros and cons of an amputation which I think will be a BKA. The patient had a question about whether this is curative or not. I told him that I thought it would be although spread of staph aureus infection is not unheard of. MRI of the right foot was done at the end of April 2019 did not show osteomyelitis. The patient last saw Dr. Hatcher on 9/3. I'll send Dr. Hatcher a message. I would really like him to weigh the pros and cons of a BKA on the left otherwise he'll probably need IV antibiotics and perhaps hyperbaric oxygen again. He has not had a good response to this in the past. His MRI earlier this month showed progressive damage in the remnants of the fourth and third metatarsal 09/21/2018; sees Dr. Graves of orthopedics next week to discuss a left BKA in response to the osteomyelitis in the left foot. Using silver alginate to all wound areas. He is completing the Zyvox I did put in for him after last culture showed MRSA 09/29/2018; patient saw Dr. Graves of orthopedics discussed the osteomyelitis in the left foot third and fourth metatarsals. He did not recommend urgent surgery but certainly stated the only surgical option would be a BKA. He is communicating with Dr. Hatcher. The problem here is the instability of the areas on his foot which constantly generate draining abscesses. I will communicate with Dr. Hatcher about this. He has an appointment on 10/23 10/05/2018; patient sees Dr. Hatcher on 10/23. I have sent him a secure message not ordered any additional antibiotics for now. Patient continues to have 2 open areas on the left plantar foot at the fifth metatarsal head and the lateral aspect of the foot. These have not changed all that much. The area on the right third met head still has thick callus and surrounding subcutaneous tissue we have been using silver alginate 10/12/2018; patient sees Dr. Hatcher or colleague on  10/23. 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 The area on the right plantar foot is just about closed. The superior wound on the left fifth metatarsal head is callused over but I am not sure if this is closed. The area below it is about the same. Considerable amount of callus on the lateral foot. We have been using silver alginate to the wounds 10/19/2018; the patient saw Dr. Hatcher on 10/22. He wishes to try and continue to save the left foot. He has been given 6 weeks of oral ciprofloxacin. We continue to put a total contact cast on the right foot. The area over the fifth metatarsal head on the left is callused over/perhaps healed but I did not remove the callus to find out. He still has the wound on the left lateral midfoot requiring debridement. We have been using silver alginate to all wound areas 10/26/2018 On the left foot our intake nurse noted some purulent drainage from the inferior wound [currently the only one that is not callused   over] On the right foot even though he is in total contact cast a considerable amount of thick black callus and surface eschar. On this side we have been using silver alginate under a total contact cast. he remains on ciprofloxacin as prescribed by Dr. Hatcher 11/02/2018; culture from last week which is done of the probing area on the left midfoot wound showed MRSA "few". I am going to need to contact Dr. Hatcher which I will do today I think he is going to need IV antibiotics again. The area on the left foot which was callused on the side is clearly separating today all of this was removed a copious amounts of callus and necrotic subcutaneous tissue. The area on the right plantar foot actually remained healthy looking with a healthy granulated base. We are using silver alginate to all wound areas 11/09/2018; unfortunately neither 1 of the patient's wounds areas looks at all satisfactory. On  the left he has considerable necrotic debris from the plantar wound laterally over the foot. I removed copious amounts of material including callus skin and subcutaneous tissue. On the right foot he arrives with undermining and frankly purulent drainage. Specimen obtained for culture and debridement of the callused skin and subcutaneous tissue from around the circumference. Because of this I cannot put him back in a total contact cast but to be truthful we are really unfortunately not making a lot of progress I did put in a secure text message to Dr. Hatcher wondering about the MRSA on the left foot. He suggested vancomycin although this is not started. I was left wondering if he expected me to send this into dialysis. Has we have more purulence on the right side wait for that result before calling dialysis 11/16/2018; I called dialysis earlier this week to get the vancomycin started. I think he got the first dose on Tuesday. The patient tells me that he fell earlier this week twisting his left foot and ankle and he is swelling. He continues to not look well. He had blood cultures done at dialysis on Tuesday he is not been informed of the results. 12/07/2018; the patient was admitted to hospital from 11/22/2018 through 12/02/2018. He underwent a left BKA. Apparently had staph sepsis. In the meantime being off the right foot this is closed over. He follows up with Dr. 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 Subsequent to this he actually had multiple falls in fact he tells me to following which calls the wound to be his apparently. He has been seeing Dr. Graves and his physician assistant in the interim. They actually did want him to come back for reevaluation to see if there's anything we can do for me wound care perspective the help this area to heal more appropriately. The patient  states that he does not have a tremendous amount of pain which is good news. No fevers, chills, nausea, or vomiting noted at this time. We have gotten approval from Dr. Graves office had [facility] for us to treat the patient for his stop wound. This is due to the fact that the patient was in the 90 day postop global. 1/21; the patient does not have an open wound on the right foot although he does have some pressure areas that will need to be padded when he is transferring. The dehisced surgical wound looks clean although there is some undermining of 1.5 cm   superiorly. 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 This appears healthy but there is undermining. TheraSkin #1 2/11; TheraSkin #2 2/25; TheraSkin #3. Wound is measuring smaller 3/10; TheraSkin #4 wound is measuring smaller 3/24; TheraSkin #5 wound is measuring smaller and looks healthy. 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 He has no open area on the right foot although he does have some callused area over the original third metatarsal head wound. The third metatarsal is also subluxed on the right foot. I have warned him today that he cannot consider wearing a prosthesis for at least a month but he can go for measurements. He is going to need to keep the right foot padded is much as possible in his diabetic shoe indefinitely READMISSION 06/12/2019 Mr. Berent is a type II diabetic on dialysis. He has been in this clinic multiple times with wounds on his bilateral lower extremities. 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 He also has a history of wounds on his right foot but tells me that everything is going well here. He actually came in here with his prosthesis walking with a cane. We were all really quite gratified to see  this He states over the last several weeks he has had a painful area at the tip of the left third finger. His dialysis shunt in his is in the left upper arm and this particularly hurts during dialysis when his fingers get numb and there is a lot of pain in the tip of his left third finger. I believe he is seen vascular surgery. He had a Doppler done to evaluate for a dialysis steal syndrome. He is going for a banding procedure 1 week from today towards the left AV fistula. I think if that is unsuccessful they will be looking at creating a new shunt. He had a course of doxycycline when he which he finished about a week ago. He has been using Bactroban to the finger 6/25; the patient had his banding procedure and things seem to be going better. He is not having pain in his hand at dialysis. The area on the tip of his finger seems about closed. He did however have a paronychia I the last time I saw him. I have been advising him to use topical Bactroban washing the finger. Culture I did last time showed a few staph epidermidis which I was willing to dismiss as superficial skin contaminant. He arrives today with the finger wound looking better however the paronychia a seems worse 7/9; 2-week follow-up. He does not have an open wound on the tip of his third left finger. I thought he had an initial ischemic wound on the tip of the finger as well as a paronychia a medially. All of this appears to be closed. 8/6-Patient returns after 3 weeks for follow-up and has a right second met head plantar callus with a significant area of maceration hiding an ulcer ABI repeated today on right - 1.26 8/13-Patient returns at 1 week, the right second met head plantar wound appears to be slightly better, it is surrounded by the callus area we are using silver alginate 8/20; the patient came back to clinic 2 weeks ago with a wound on the right second metatarsal head. He apparently told me   he developed callus in this area  but also went away from his custom shoes to a pair of running shoes. Using silver alginate. 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 There might be much we can do to offload this although the patient tells me he has a wheelchair that he is using to try and stay off the wound is much as possible 8/27; right second metatarsal head. Still small punched out area with thick callus and subcutaneous tissue around the wound. We have been using silver collagen. This is always been an issue with this man's wound especially on this foot 9/3; right second met head. Still the same punched-out area with thick callus and subcutaneous tissue around the wound removing the circumference demonstrates repetitively undermining area. I have removed all the subcutaneous tissue associated with this. We have been using silver collagen 9/15; right second metatarsal head. Same punched-out thick callus and subcutaneous tissue around the wound area. Still requiring debridement we have been using silver collagen I changed back to silver alginate X-ray last time showed no evidence of osteomyelitis. Culture showed Klebsiella and he was started and Keflex apparently just 4 days ago 9/24; right second metatarsal head. He is completed the antibiotics I gave him for 10 days. Same punched-out thick callus and subcutaneous tissue around the area. Still requiring debridement. We have been using silver alginate. The area itself looks swollen and this could be just [place] that comes on this area with walking on a prosthesis on the left however I think he needs an MRI and I am going to order that today. There is not an option to offload this further 10/1; right second plantar metatarsal head. MRI booked for October 6. Generally looking better today using silver alginate 10/8; right second plantar metatarsal head. MRI that was done on 10/6 was negative for osteomyelitis noted prior amputations of the second and fourth  toes at the MTP. Prior amputation of the third toe to the base of the middle phalanx. Prior amputation of the fifth toe to the head of the metatarsal. 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 He does not recall about hearing about this. He did not have any pain but then again he has reduced sensation from diabetic neuropathy 10/15; right second plantar metatarsal head. Still in the same condition callus nonviable subcutaneous tissue which cleans up nicely with debridement but reforms by the next week. The patient tells me that he is offloading this is much as he can including taking his wheelchair to dialysis. We have been using silver alginate, changed to silver collagen today 10/22; right second plantar metatarsal head. Wound measures smaller but still thick callus around this wound. Still requiring debridement 11/5 right second plantar metatarsal head. Less callus around the wound circumference but still requiring debridement. There is still some undermining which I cleaned out the wound looks healthy but still considerable punched out depth with a relatively small circumference to the wound there is no palpable bone no purulent drainage 11/12; right second plantar metatarsal head. Small wound with thick callus tissue around the wound and significant undermining. We have been using silver collagen after my continuous debridements of this area 11/19; patient arrives in today with purulent drainage coming out of the wound in the second third met head area on the right foot. Serosanguineous thick drainage. Specimen obtained for culture. 11/21/2019 on evaluation today patient appears to be doing well with   regard to his foot ulcer. The good news is is culture came back negative for any bacteria there was no growth. The other good news is his x-ray also appears to be doing well. The foot is also measuring much better than what it was. Overall I am very  pleased with how things seem to be progressing. 12/22; patient was in [place] for several weeks due to the death of a family member. He said he stayed off the wound using silver alginate. 01/03/2020. Patient has a small open area with roughly 0.3 cm of circumferential undermining. The orifice looks smaller. We have been using silver collagen. There is not a wound more aggressive way to offload this area as he has a prosthesis on the other leg 1/21; again the same small area is 2 weeks ago. We have been using silver collagen I change that to endoform today I really believe that this is an offloading issue 2/4; the area on the right third met head. We have been using endoform. 2/11 the area on the right third met head we have been using silver alginate. 2/25; the area on the right third met head. There is much less callus on this today. The patient states he is offloading this even more aggressively. As an example he is taking his wheelchair into dialysis on most days 3/4; right third metatarsal head. Again he has eschar over the surface of the wound which I removed with a #3 curette. Some subcutaneous debris. This has 0.8 cm of direct probing depth. I cannot feel any bone here. I did do a culture the area 3/11; right third metatarsal head. Again an eschared area over the wound which almost makes this look superficial. Again debridement reveals a wound with probing depth probably not much different from last week there is no palpable bone no palpable purulence. The patient tells me that he is making every effort to offload this area properly. He is taking wheelchair into dialysis etc. There is no option for forefoot offloading or a total contact cast. We used Oasis #1 today 3/18; again callus and eschar over the wound surface. I remove this but still a probing wound although only 3 mm in depth this time. This is an improvement 3/25; again callus and eschar over the wound surface which I  removed the wound is much deeper today at 0.7 cm. There is no palpable bone. Because of the appearance of this a culture was done. I am not going to put the Oasis back in this. Concerned about underlying infection. Notable for the fact that he is just finished doxycycline for the last go round of this 3/30; still 0.7 cm in depth which is disappointing. Culture I gave last time showed a few Staph aureus which is methicillin susceptible. This should have been taken care of by the recent course of doxycycline I gave him. We are using silver alginate 4/6; almost 1 cm in depth. We have been using silver alginate with underlying Bactroban. 4/15; about 1 cm in depth still. There is no palpable bone but there is undermining thick surface again as usual. Our intake nurse noted what looked to be purulent drainage I suppose that could have been Bactroban but I did a culture for CandS 4/20; depth down 0.7 cm. Culture from last time was negative we have been using Bactroban and silver alginate 4/29; depth down 0.5 cm. Using silver alginate. He reports some drainage 5/6; comes in with the wound worse. Wound is deeper and   tunnels. Measures 0.9 depth and an undermining area of 1.8 cm. 5/18; there is a change the dressing last week. Still using Sorbact hydrogel. What I can see of the base of the wound looks better. 6/1. Not much change. I changed him to endoform today. He has 1.1 cm in depth no palpable bone 6/8; again a small punched out wound with significant undermining. Culture I did last week was negative. T oday he had some purulent drainage perhaps mixed with some retained endoform. 6/22; MRI report below IMPRESSION: Skin ulceration on the plantar surface of the foot at the level of the head of the second metatarsal. Mild marrow edema in the head of the second metatarsal could be reactive or due to osteomyelitis. Thin fluid collection surrounding the head and neck of the second metatarsal appears to  communicate with the patient's skin wound and is worrisome for a septic collection. Advanced first MTP osteoarthritis. Prior amputations as noted above. Electronically Signed By: Inge Rise M.D. On: 06/16/2020 11:51 PCR culture I did last time I believe showed peptostreptococcus. There was no comment on any other gram-negative or gram-positive organisms. I gave him a course of Flagyl which I think he is finishing up. We are not making any progress with the wound over the right second/third metatarsal head. We are using silver alginate putting him in kerlix Coban 6/29; has his appointment with Dr. [name] of orthopedics next Tuesday I think we see him on the same day. He was in for a nurse visit today. Nurse noted undermining of skin and callus so I was asked to look at this. We have been using silver alginate and kerlix Coban 7/6; patient saw Dr. [name] PA today will see Dr. [name] on Tuesday. The major question is does he needs surgery to clean out the area in question culture question resect any involved bone etc. Otherwise he will need to see infectious disease for broad-spectrum antibiotics given at dialysis 7/13; I talked to Dr. [name] late last week. He was not impressed with the MRI results thinking that the bone changes may have been reactive rather than osteomyelitis. He did not think he needed drainage of an abscess thinking of the fluid we are seeing was superficial and contiguous with the actual wound. He did not think he needed surgery or resection. Recommended consideration of infectious disease and return him to our care. We have been using silver alginate 7/20; PCR culture I did last week showed staph aureus methicillin sensitive. I have him on twice daily Keflex. Silver alginate to the wound Electronic Signature(s) Signed: 07/15/2020 6:00:01 PM By: Linton Ham MD Entered By: Linton Ham on 07/15/2020  15:50:51 -------------------------------------------------------------------------------- Physical Exam Details Patient Name: Date of Service: [name] 07/15/2020 2:30 PM Medical Record Number: [id number] Patient Account Number: [id number] Date of Birth/Sex: Treating RN: 06-19-1951 (68 y.o. [name] Primary Care Provider: [name] Other Clinician: Referring Provider: Treating Provider/Extender: Tanna Savoy in Treatment: 57 Constitutional Sitting or standing Blood Pressure is within target range for patient.. Pulse regular and within target range for patient.Marland Kitchen Respirations regular, non-labored and within target range.. Temperature is normal and within the target range for the patient.Marland Kitchen Appears in no distress. Cardiovascular The pulses are palpable. Integumentary (Hair, Skin) Erythema around the wound. Notes Wound exam; small punched out area the area is larger and deeper. No debridement. Electronic Signature(s) Signed: 07/15/2020 6:00:01 PM By: Linton Ham MD Entered By: Linton Ham on 07/15/2020 15:51:37 -------------------------------------------------------------------------------- Physician Orders Details Patient  Name: Date of Service: [name], [name] 07/15/2020 2:30 PM Medical Record Number: [id number] Patient Account Number: [id number] Date of Birth/Sex: Treating RN: 10/02/1951 (69 y.o. 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 Dressing Change Frequency Wound #41 Right Metatarsal head second Do not change entire dressing for one week. Skin Barriers/Peri-Wound Care Moisturizing lotion Wound Cleansing May shower with protection. - patient to use a cast protector. Primary Wound Dressing Wound #41 Right Metatarsal head second Calcium Alginate with Silver Secondary Dressing Wound #41 Right Metatarsal head second Dry Gauze Other: - felt to offload periwound. Edema Control Kerlix and Coban - Right Lower Extremity Avoid standing for long periods of time Elevate legs to the level of the heart or above for 30 minutes daily and/or when sitting, a frequency of: - throughout the day Additional Orders / Instructions Other: - minimize walking and standing on foot to aid in wound healing and callus buildup. Electronic Signature(s) Signed: 07/15/2020 6:00:01 PM By: Robson, Romario MD Signed: 07/18/2020 5:42:58 PM By: Epps, Carrie RN Entered By: Epps, Carrie on 07/15/2020 13:58:02 -------------------------------------------------------------------------------- Problem List Details Patient Name: Date of Service: [name], [name] 07/15/2020 2:30 PM Medical Record Number: [id number] Patient Account Number: [id number] Date of Birth/Sex: Treating RN: 09/28/1951 (69 y.o. 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, other extremity 06/12/2019 No Yes L03.115 Cellulitis of right lower limb 11/15/2019 No Yes Inactive Problems ICD-10 Code Description Active Date Inactive  Date L03.114 Cellulitis of left upper limb 06/12/2019 06/12/2019 L98.498 Non-pressure chronic ulcer of skin of other sites with other specified severity 06/12/2019 06/12/2019 S61.209D Unspecified open wound of unspecified finger without damage to nail, subsequent 06/12/2019 06/12/2019 encounter Resolved Problems Electronic Signature(s) Signed: 07/15/2020 6:00:01 PM By: Robson, Arion MD Entered By: Robson, Kori on 07/15/2020 15:48:47 -------------------------------------------------------------------------------- Progress Note Details Patient Name: Date of Service: [name], [name] 07/15/2020 2:30 PM Medical Record Number: [id number] Patient Account Number: [id number] Date of Birth/Sex: Treating RN: 11/30/1951 (69 y.o. M) Epps, Carrie Primary Care Provider: Jones, [name] Other Clinician: Referring Provider: Treating Provider/Extender: Robson, Gustabo [name] Weeks in Treatment: 57 Subjective History of Present Illness (HPI) The following HPI elements were documented for the patient's wound: Location: Patient presents with a wound to bilateral feet. Quality: Patient reports experiencing essentially no pain. Severity: Mildly severe wound with no evidence of infection Duration: Patient has had the wound for greater than 2 weeks prior to presenting for treatment The patient is a pleasant 69 yrs old bm here for evaluation of ulcers on the plantar aspect of both feet. 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 He has a left arm graft for access. He has been trying to stay off his   feet for weeks but does not seem to have any improvement in the wound. He has been seeing someone at the foot center and was referred to the Wound Care center for further evaluation. 12/30/15 the patient has 3 wounds one over the right first metatarsal head and 2 on the left foot at the left fifth and lleft first metatarsal head. All of these look relatively  similar. The one over the left fifth probe to bone I could not prove that any of the others did. I note his MRI in November that did not show osteomyelitis. His peripheral pulses seem robust. All of these underwent surgical debridement to remove callus nonviable skin and subcutaneous tissue 01/06/16; the patient had his sutures removed from the right fifth ray amputation. There may be a small open part of this superiorly but otherwise the incision looks good. Areas over his right first, left first and left fifth metatarsal head all underwent surgical debridement as a varying degree of callus, skin and nonviable subcutaneous tissue. The area that is most worrisome is the right fifth metatarsal head which has a wound probes precariously close to bone. There is no purulent drainage or erythema 01/13/16; I'm not exactly sure of the status of the right fifth ray amputation site however he follows with Dr. [name] later this week. The area over his right first plantar metatarsal head, left first and fifth plantar metatarsal head are all in the same status. Thick circumferential callus, nonviable subcutaneous tissue. Culture of the left fifth did not culture last week 01/20/16; all of the patient's wounds appear and roughly the same state although his amputation site on the right lateral foot looks better. His wounds over the right first, left first and left fifth metatarsal heads all underwent difficult surgical debridement removing circumferential callus nonviable skin and subcutaneous tissue. There is no overt evidence of infection in these areas. MRI at the end of December of the left foot did not show osteomyelitis, right foot showed osteomyelitis of the right fifth digit he is now status post amputation. 01/27/16 the patient's wounds over his plantar first and fifth metatarsal heads on the left all appear better having been started on a total contact cast last week. They were debridement of circumferential  callus and nonviable subcutaneous tissue as was the wound over the first metatarsal head. His surgical incision on the right also had some light surface debridement done. 03/23/2016 -- the patient was doing really well and most of his wounds had almost completely healed but now he came back today with a history of having a discharge from the area of his right foot on the plantar aspect and also between his first and second toe. He also has had some discharge from the left foot. Addendum: I spoke to the PA [name] at the dialysis center whose fax number is [phone]. We discussed the infection the patient has and she will put the patient on vancomycin and Fortaz until the final culture report is back. We will fax this as soon as available. 03/30/2016 -- left foot x-ray IMPRESSION:No definitive osteomyelitis noted. X-ray of the right foot -- IMPRESSION: 1. Soft tissue swelling. Prior amputation right fifth digit. No acute or focal bony abnormality identified. If osteomyelitis remains a clinical concern, MRI can be obtained. 2. Peripheral vascular disease. His culture reports have grown an MSSA -- and we will fax this report to his hemodialysis center. He was called in a prescription of oral doxycycline but I have told her  not to fill this in as he is already on IV antibiotics. 04/06/2016 -- a few days ago, I spoke to the hemodialysis center nurse who had stopped the IV antibiotics and he was given a prescription for doxycycline 100 mg by mouth twice a day for a week and he is on this at the present time. 05/11/2016 -- he has recently seen his PCP this week and his hemoglobin A1c was 7. He is working on his paperwork to get his orthotic shoes. 05/25/2016 -- -- x-ray of the left foot IMPRESSION:No acute bony abnormality. No radiographic changes of acute osteomyelitis. No change since prior study. X-ray of the right foot -- IMPRESSION: Postsurgical changes are seen involving the fifth toe. No  evidence of acute osteomyelitis. he developed a large blister on the medial part of his right foot and this opened out and drain fluid. 06/01/2016 -- he has his MRI to be done this afternoon. 06/15/2016 -- MRI of the left forefoot without contrast shows 2 separate regions of cutaneous and subcutaneous edema and possible ulceration and blistering along the ball of the foot. No obvious osteomyelitis identified. MRI of the right foot showed cutaneous and subcutaneous thickening plantar to the first digit sesamoid with an ulcer crater but no underlying osteomyelitis is identified. 06/22/2016 -- the right foot plantar ulcer has been draining a lot of seropurulent material for the last few days. 06/30/2016 -- spoke to the dialysis center and I believe I spoke to Thomas a PA at the center who discussed with me and agreed to putting Thales on vancomycin until his cultures arrive. On review of his culture report no WBCs were seen or no organisms were seen and the culture was reincubated for better growth. The final report is back and there were no predominant growth including Streptococcus or Staphylococcus. Clinically though he has a lot of drainage from both wounds a lot of undermining and there is further blebs on the left foot towards the interspace between his first and second toe. 08/10/2016 -- the culture from the right foot showed normal skin flora and there was no Staphylococcus aureus was group A streptococcus isolated. 09/21/2016 -- -- MRI of the right foot was done on 09/13/2016 - IMPRESSION: Findings most consistent with acute osteomyelitis throughout the great toe, sesamoid bones and plantar aspect of the head of the first metatarsal. Fluid in the sheath of the flexor tendon of the great toe could be sympathetic but is worrisome for septic tenosynovitis. First MTP joint effusion worrisome for septic joint. He was admitted to the hospital on 09/11/2016 and treated for a fever with vancomycin  and cefepime. He was seen by Dr. Jeffrey Hatcher of infectious disease who recommended 6 weeks treatment with vancomycin and ceftazidime with his hemodialysis and to continue to see as in the wound clinic. Vascular consult was pending. The patient was discharged home on 09/14/2016 and he would continue with IV antibiotics for 6 weeks. 09/28/16 wound appears reasonably healthy. Continuing with total contact cast 10/05/16 wound is smaller and looking healthy. Continue with total contact cast. He continues on IV antibiotics 10/12/2016 -- he has developed a new wound on the dorsal aspect of his left big toe and this is a superficial injury with no surrounding cellulitis. He has completed 30 days of IV antibiotics and is now ready to start his hyperbaric oxygen therapy as per his insurance company's recommendation. 10/19/2016 -- after the cast was removed on the right side he has got good resolution of his ulceration   on the right plantar foot. he has a new wound on the left plantar foot in the region of his fourth metatarsal and this will need sharp debridement. 10/26/2016 -- Xray of the right foot complete: IMPRESSION: Changes consistent with osteomyelitis involving the head of the first metatarsal and base of the first proximal phalanx. The sesamoid bones are also likely involved given their positioning. 11/02/2016 -- there still awaiting insurance clearance for his hyperbaric oxygen therapy and hopefully he will begin treatment soon. 11/09/2016 -- started with hyperbaric oxygen therapy and had some barotrauma to the right ear and this was seen by ENT who was prescribed Afrin drops and would probably continue with HBO and he is scheduled for myringotomy tubes on Friday 11/16/2016 -- he had his myringotomy tubes placed on Friday and has been doing much better after that with some fluid draining out after hyperbaric treatment today. The pain was much better. 11/30/2016 -- over the last 2 days he noticed  a swelling and change of color of his right second toe and this had been draining minimal fluid. 12/07/2016 -- x-ray of the right foot -- IMPRESSION:1. Progressive ulceration at the distal aspect of the second digit with significant soft tissue swelling and osseous changes in the distal phalanx compatible with osteomyelitis. 2. Chronic osteomyelitis at the first MTP joint. 3. Ulcerations at the second and third toes as well without definite osseous changes. Osteomyelitis is not excluded. 4. Fifth digit amputation. On 12/01/2016 oo I spoke to Dr. Bobby Rumpf, the infectious disease specialist, who kindly agreed to treat this with IV antibiotics and he would call in the order to the dialysis center and this has been discussed in detail with the patient who will make the appropriate arrangements. The patient will also book an appointment as soon as possible to see Dr. [name] in the office. Of note the patient has not been on antibiotics this entire week as the dialysis center did not receive any orders from Dr. [name]. I got in touch with Dr. [name] who tells me the patient has an appointment to see him this coming Wednesday. 12/14/2016 -- the patient is on ceftazidime and vancomycin during his dialysis, and I understand this was put on by his nephrologist Dr. Raliegh Ip possibly after speaking with infectious disease Dr. [name] 12/23/16; patient was on my schedule today for a wound evaluation as he had difficulties with his schedule earlier this week. I note that he is on vancomycin and ceftazidine at dialysis. In spite of this he arrives today with a new wound on the base of the right second toe this easily probes to bone. The known wound at the tip of the second toe With this area. He also has a superficial area on the medial aspect of the third toe on the side of the DIP. This does not appear to have much depth. The area on the plantar left foot is a deep area but did not probe the bone 12/28/16 -- he  has brought in some lab work and the most recent labs done showed a hemoglobin of 12.1 hematocrit of 36.3, neutrophils of 51% WBC count of 5.6, BN of 53, albumin of 4.1 globulin of 3.7, vancomycin 13 g per mL. 01/04/2017 -- he saw Dr. [name] who has recommended a amputation of the right second toe and he is awaiting this date. He sees Dr. Bobby Rumpf of infectious disease tomorrow. The bone culture taken on 12/28/2016 had no growth in 2 days. 01/11/2017 -- was seen by  Dr. Bobby Rumpf regarding the management and has recommended a eval by vascular surgeons. He recommended to continue the antibiotics during his hemodialysis. Xray of the left foot -- IMPRESSION: No acute fracture, dislocation, or osseous erosion identified. 01/18/2017 -- he has his vascular workup later today and he is going to have his right foot second toe amputation this coming Friday by Dr. [name]. We have also put in for a another 30 treatments with hyperbaric oxygen therapy 01/25/2017 -- I have reviewed Dr. Donnetta Hutching is vascular report from last week where he reviewed him and thought his vascular function was good enough to heal his amputation site and no further tests were recommended. He also had his orthopedic related to surgery which is still pending the notes and we will review these next week. 02/01/2017 -- the operative remote of Dr. 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 02/08/2017 --he was seen by Dr. [name] on 02/03/2017 -- patient is on [place]. after a thorough review he had recommended to continue with antibiotics during hemodialysis. The  patient was seen by Dr. [name], but I do not find any follow-up note on the electronic medical record. he has removed the dressing over the right foot to remove the sutures and asked him to see him back in a month's time 02/22/2017 -- patient has been febrile and has been having symptoms of the upper respiratory tract infection but has not been checked for the flu and it's been over 4 days now. He says he is feeling better today. Some drainage between his left first and second toe and this needed to be looked at 03/08/2017 -- was seen by Dr. Bobby Rumpf on 03/07/2017 after review he will stop his antibiotics and see him back in a month to see if he is feeling well. 03/15/2017 -- he has completed his course of hyperbaric oxygen therapy and is doing fine with his health otherwise. 03/22/2017 -- his nutritionist at the dialysis center has recommended a protein supplement to help build his collagen and we will prescribe this for him when he has the details 05/03/2017 -- he recently noticed the area on the plantar aspect of his fifth metatarsal head which opened out and had minimal drainage. 05/10/2017 -- -- x-ray of the left foot -- IMPRESSION:1. No convincing conventional radiographic evidence of active osteomyelitis.2. Active soft tissue ulceration at the tip of the fifth digit, and at the lateral aspect of the foot adjacent the base of the fifth metatarsal. 3. Surgical changes of prior fourth toe amputation. 4. 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 5. Small vessel atherosclerotic vascular calcifications. 6. 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 ======= Readmission after 5 weeks: 06/15/2017 -- the patient returns after 5 weeks having had an MRI done on 05/17/2017 MRI of the left foot without contrast showed soft tissue ulcer overlying the base of the fifth metatarsal. 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 No drainable fluid collection to suggest an abscess. He was in hospital between 05/16/2017 and 05/25/2017 -- and on discharge was asked to follow-up with Dr. [name]  and Dr. Hatcher. He was treated 2 weeks post discharge with vancomycin plus ceftriaxone with hemodialysis and oral metronidazole 500 mg 3 times a day. 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 last hemoglobin A1c was 6.7% He had a postoperative MR of the foot on 05/23/2017 -- which showed no new areas of cortical bone loss, edema or soft tissue ulceration to suggest osteomyelitis. the patient has completed his course of IV antibiotics during dialysis and is to see the infectious disease doctor tomorrow. Dr. Graves had seen him after suture removal and asked him to keep the wound with a dry dressing 06/21/2017 -- he was seen by Dr. Jeffrey Hatcher of infectious disease on 06/16/2017 -- he stopped his Flagyl and will see him back in 6 weeks. He was asked to follow-up with Dr. Graves and with the wound clinic clinic. 07/05/17; bone biopsy from last time showed acute osteomyelitis. This is from the reminiscent in the left fifth metatarsal. Culture result is apparently still pending [holding for anaerobe}. From my understanding in this case this is been a progressive necrotic wound which is deteriorated markedly over the last 3 weeks since he returned here. He now has a large area of exposed bone which was biopsied and cultured last week. Dr. Snyder has put him on vancomycin and Fortaz during his hemodialysis and Flagyl orally. He is to see Dr. Graves next week 07/19/2017 -- the patient was reviewed by Dr. Graves of orthopedics who reviewed the case in detail and agreed with the plan to continue with IV antibiotics, aggressive wound care and hyperbaric oxygen therapy. He would see him back in 3 weeks' time 08/09/2017 --  saw Dr. Jeffrey Hatcher on 08/03/2017 -he was restarted on his antibiotics for 6 weeks which included vanco, ceftaz and Flagyl. he recommended continuing his antibiotics for 6 weeks and reevaluate his completion date. He would continue with wound care and hyperbaric oxygen therapy. 08/16/2017 -- I understand he will be completing his 6 weeks of antibiotics sometime later this week. 09/19/2017 -- he has been without his wound VAC for the last week due to lack of supplies. He has been packing his wound with silver alginate. 10/04/2017 -- he has an appointment with Dr. Hatcher tomorrow and hence we will not apply the wound VAC after his dressing changes today. 10/11/2017 -- he was seen by Dr. Jeffrey Hatcher on 10/05/2017, noted that the patient was currently off antibiotics, and after thorough review he recommended he follow-up with the wound care as per plan and no further antibiotics at this point. 11/29/17; patient is arrived for the wound on his left lateral foot.. He is completed IV antibiotics and 2 rounds of hyperbaric oxygen for treatment of underlying osteomyelitis. He arrives today with a surface on most of the wound area however our intake nurse noted drainage from the superior aspect. This was brought to my attention. 12/13/2017 -- the right plantar foot had a large bleb and once the bleb was opened out a large callus and subcutaneous debris was removed and he has a plantar ulcer near the fourth metatarsal head 12/28/17 on evaluation today patient appears to be doing acutely worse in regard to his left foot. The wound which has been appearing to do better as now open up more deeply there is bone palpable at the base of the wound unfortunately. He tells me that this feels "like it did when he had osteomyelitis previously" he   also noted that his second toe on the left foot appears to be doing worse and is swollen there does appear to be some fluid collected underneath. His right foot plantar  ulcer appears to be doing somewhat better at this point and there really is no complication at the site currently. No fevers, chills, nausea, or vomiting noted at this time. Patient states that he normally has no pain at this site however. T oday he is having significant pain. 01/03/18; culture done last week showed methicillin sensitive staph aureus and group B strep. He was on [place] however he arrives today with a fever of 101. He has a functional dialysis shunt in his left arm but has had no pain here. No cough. He still makes some urine no dysuria he did have abdominal pain nausea and diarrhea over the weekend but is not had any diarrhea since yesterday. Otherwise he has no specific complaints 01/05/18; the patient returns today in follow-up for his presentation from 1/8. At that point he was febrile. I gave him some Levaquin adjusted for his dialysis status. He tells me the fever broke that night and it is not likely that this was actually a wound infection. He is gone on to have an MRI of the left foot; this showed interval development of an abnormal signal from the base of the third and fourth metatarsal and in the cuboid reminiscent consistent with osteomyelitis. There is no mention of the left second toe I think which was a concern when it was ordered. The patient is taking his Levaquin 01/10/18; the patient has completed his antibiotics today. The area on the left lateral foot is smaller but it still probes easily to bone. He has underlying osteomyelitis here and sees Dr. Campbell of infectious disease this week ooThe area on the right third plantar metatarsal head still requires debridement not much change in dimensions ooHe has a new wound on the medial tip of his right third toe again this probes to bone. He only noticed this 2 days ago 01/17/18; the patient saw Dr. Hatcher last week and he is back on Vanco and Fortaz at dialysis. This is related to the osteomyelitis in the base of his left  lateral foot which I think is the bases of his third and fourth metatarsals and cuboid remnant. We are previously seeing him for a wound also on the base of the fourth med head on the right. X-ray of this area did not show osteomyelitis in relation to a new wound on the tip of his right third toe. Again this probes to bone. Finally his second toe on the left which didn't really show anything on the MRI at least no report appears to have a separated cutaneous area which I think is going to come right off the tip of his toe and leave exposed bone. Whether this is infectious or ischemic I am not clear Culture I did from the third toe last week which was his new wound was negative. Plain x-ray on the right foot did not show osteomyelitis in the toes 01/24/18; the patient is going went to see Dr. Graves. He is going to have a amputation of the left second and right third toe although paradoxically the left second toe looks better than last week. We have treating a deep probing wound on the left lateral foot and the area over the third metatarsal head on the right foot. 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 Also   a debridement including bone of the left lateral foot and a closure which is still sutured. This is a bit surprising. Also apparent debridement of the base of the right third toe wound. 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 I'm not sure exactly where we are in the frame of things here. The wound on the left lateral foot is still sutured also the amputation sites of the left second and right third toe. He is been using Xeroform to the sutured areas silver alginate to the right foot 02/14/18; he continues on Rocephin at dialysis for osteomyelitis. I still don't have  a good sense of where we are in the treatment duration. The wounds on the left lateral foot had the sutures removed and this clearly still probes deeply. We debrided the area with a #5 curet. We'll use silver alginate to the wound oothe area over the right third met head also required debridement of callus skin and subcutaneous tissue and necrotic debris over the wound surface. This tunnels superiorly but I did not unroofed this today. ooThe areas on the tip of his toes have a crusted surface eschar I did not debridement this either. 02/21/18; he continues on Rocephin at dialysis for osteomyelitis. I don't have a good sense of where we are and the treatment duration. The wounds on the left lateral foot is callused over on presentation but requires debridement. The area on the right third med head plantar aspect actually is measuring smaller 02/28/18;patient is on Vanco and Fortaz at dialysis, I'm not sure why I thought this was Rocephin above unless it is been changed. I don't have a good sense of time frame here. Using silver collagen to the area on the left lateral foot and right third med head plantar foot 03/07/18; patient is completing bank and Fortaz at dialysis soon. He is using Silver collagen to the area on the left lateral foot and the right third metatarsal head. He has a smaller superficial wound distal to the left lateral foot wound. 03/14/18-he is here in follow-up evaluation for multiple ulcerations to his bilateral feet. He presents with a new ulceration to the plantar aspect of the left foot underneath the blister, this was deroofed to reveal a partial thickness ulcer. He is voicing no complaints or concerns, tolerated dialysis yesterday. We will continue with same treatment plan and he will follow-up next week 03/21/18; the patient has a area on the lateral left foot which still has a small probing area. The overall surface area of the wound is better. He presented with a new ulceration  on the left foot plantar fifth metatarsal head last week. The area on the right third metatarsal head appears smaller.using silver alginate to all wound areas. The patient is changing the dressing himself. He is using a [place] forefoot offloading are on the right and a healing sandal on the left 03/28/18; original wound left lateral foot. Still nowhere close to looking like a heeling surface oosecondary wound on the left lateral foot at the base of his question fifth metatarsal which was new last week ooRight third metatarsal head. ooWe have been using silver alginate all wounds 04/04/18; the original wound on the left lateral foot again heavy callus surrounding thick nonviable subcutaneous tissue all requiring debridement. The new wound from   2 weeks ago just near this at the base of the fifth metatarsal head still looks about the same ooUnfortunately there is been deterioration in the third medical head wound on the right which now probes to bone. I must say this was a superficial wound at one point in time and I really don't have a good frame of reference year. I'll have to go back to look through records about what we know about the right foot. 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 He is also been to see Dr. Graves. Somewhere in here as somebody is ordered a VASCULAR evaluation which was done on 03/22/18. On the right is anterior tibial artery was monophasic triphasic at the posterior tibial artery. On the left anterior tibial artery monophasic posterior tibial artery monophasic. ABI in the right was 1.43 on the left 1.21. TBIs on the right at 0.41 and on the left at 0.32. A vascular consult was recommended and I think has been arranged. 04/11/18; Mr. Chihuahua has not had an MRI of the right foot since 2017. Recent x-ray of the right foot done in January and February was negative. However he has had a  major deterioration in the wound over the third met head. He is completed antibiotics last week at dialysis Vanco and Fortaz. I think he is going to see vascular in follow-up. The areas on the left lateral foot and left plantar fifth metatarsal head both look satisfactory. I debrided both of these areas although the tissue here looks good. The area on the left lateral foot once probe to bone it certainly does not do that now 04/18/18; MRI of the right foot is on Thursday. He has what looks to be serosanguineous purulent drainage coming out of the wound over the right third metatarsal head today. He completed antibiotics bank and Fortaz 2 weeks ago at dialysis. He has a vascular follow-up with regards to his arterial insufficiency although I don't exactly see when that is booked. The areas on the left foot including the lateral left foot and the plantar left fifth metatarsal head look about the same. 04/25/18-He is here in follow-up evaluation for bilateral foot ulcers. MRI obtained was negative for osteomyelitis. Wound culture was negative. We will continue with same treatment plan he'll follow-up next week 05/02/18; the right plantar foot wound over the third metatarsal head actually looks better than when I last saw this. His MRI was negative for osteomyelitis wound culture was negative. ooOn the left plantar foot both wounds on the plantar fifth metatarsal head and on the lateral foot both are covered in a very hard circumferential callus. 05/09/18; right plantar foot wound over the third metatarsal head stable from last week. ooLeft plantar foot wound over the fifth metatarsal head also stable but with callus around both wound areas ooThe area on the left lateral foot had thick callus over a surface and I had some thoughts about leaving this intact however it felt boggy. ooWe've been using silver alginate all wounds 05/16/18; since the patient was last here he was hospitalized from 5/15 through  5/19. 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 This grew MRSA. He was placed on vancomycin.MRI of the left foot suggested "progressive" osteomyelitis and bone destruction of the cuboid and the base of the fifth metatarsal. Stable   erosive changes of the base of the second metatarsal. I'm wondering if they're aware that he had surgery and debridement in the area of the underlying bone previously by Dr. [name]. In any case, He was also revascularized with an angioplasty and stenting of the left tibial peroneal trunk and angioplasty of the left posterior tibial artery. He is on vancomycin at dialysis. He has a 2 week follow-up with infectious disease. He has vascular surgery follow-up. He was started on Plavix. 05/23/18; the patient's wound on the lateral left foot at the level of the fifth metatarsal head and the plantar wound on the plantar fifth metatarsal head both look better. The area on the right third metatarsal head still has depth and undermining. We've been using silver alginate. He is on vancomycin. He has been revascularized on the left 05/30/18; he continues on vancomycin at dialysis. Revascularized on the left. The area on the left lateral foot is just about closed. Unfortunately the area over the plantar fifth metatarsal head undermining superiorly as does the area over the right third metatarsal head. Both of these significantly deteriorated from last week. He has appointments next week with infectious disease and orthopedics I delayed putting on a cast on the left until those appointments which therefore we made we'll bring him in on Friday the 14th with the idea of a cast on the left foot 06/06/18; he continues on vancomycin at dialysis. Revascularized on the left. He arrives today with a ballotable swelling just above the area on the left lateral foot where his previous wound was. He  has no pain but he is reasonably insensate. The difficult areas on the plantar left fifth metatarsal head looks stable whereas the area on the right third plantar metatarsal head about the same as last week there is undermining here although I did not unroofed this today. He required a considerable debridement of the swelling on the left lateral foot area and I unroofed an abscess with a copious amount of brown purulent material which I obtained for culture. I don't believe during his recent hospitalization he had any further imaging although I need to review this. Previous cultures from this area done in this clinic showed MRSA, I would be surprised if this is not what this is currently even though he is on vancomycin. 06/13/18; he continues on vancomycin at dialysis however he finishes this on Sunday and then graduates the doxycycline previously prescribed by Dr. [name]. The abscess site on the lateral foot that I unroofed last time grew moderate amounts of methicillin-resistant staph aureus that is both vancomycin and tetracycline sensitive so we should be okay from that regard. He arrives today in clinic with a connection between the abscess site and the area on the lateral foot. We've been using silver alginate to all his wound areas. The right third metatarsal head wound is measuring smaller. Using silver alginate on both wound areas 06/20/18; transitioning to doxycycline prescribed by Dr. [name]. The abscess site on the lateral foot that I unroofed 2 weeks ago grew MRSA. That area has largely closed down although the lateral part of his wound on the fifth metatarsal head still probes to bone. I suspect all of this was connected. ooOn the right third metatarsal head smaller looking wound but surrounded by nonviable tissue that once again requires debridement using silver alginate on both areas 06/27/18; on doxycycline 100 twice a day. The abscess on the lateral foot has closed down. Although he  still has the wound on the  left fifth metatarsal head that extends towards the lateral part of the foot. The most lateral part of this wound probes to bone ooRight third metatarsal head still a deep probing wound with undermining. Nevertheless I elected not to debridement this this week ooIf everything is copious static next week on the left I'm going to attempt a total contact cast 07/04/18; he is tolerating his doxycycline. The abscess on the left lateral foot is closed down although there is still a deep wound here that probes to bone. We will use silver collagen under the total contact cast Silver collagen to the deep wound over the right third metatarsal head is well 07/11/18; he is tolerating doxycycline. The left lateral fifth plantar metatarsal head still probes deeply but I could not probe any bone. We've been using silver collagen and this will be the second week under the total contact cast ooSilver collagen to deep wound over the right third metatarsal head as well 07/18/18; he is still taking doxycycline. The left lateral fifth plantar metatarsal this week probes deeply with a large amount of exposed bone. Quite a deterioration. He required extensive debridement. Specimens of bone for pathology and CNS obtained ooAlso considerable debridement on the right third metatarsal head 07/25/18; bone for pathology last week from the lateral left foot showed osteomyelitis. CandS of the bone Enterobacter. And Klebsiella. He is now on ciprofloxacin and the doxycycline is stopped [Dr. [name] of infectious disease] area He has an appointment with Dr. [name] tomorrow I'm going to leave the total contact cast off for this week. May wish to try to reapply that next week or the week after depending on the wound bed looks. I'm not sure if there is an operative option here, previously is followed with Dr. [name] 08/01/18 on evaluation today patient presents for reevaluation. He has been seen by infectious  disease, Dr. [name], and he has placed the patient back on Ceftaz currently to be given to him at dialysis three times a week. 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 Fortunately there does not appear to be the evidence of infection at this point in time. Overall the patient has been tolerating the dressing changes without complication. We are no longer due lives in the cast of left foot secondary to the infection obviously. 08/10/18 on evaluation today patient appears to be doing rather well all things considered in regard to his left plantar foot ulcer. He does have a significant callous on the lateral portion of his left foot he wonders if I can help clean that away to some degree today. Fortunately he is having no evidence of infection at this time which is good news. No fevers chills noted. With that being said his right plantar foot actually does have a significant callous buildup around the wound opening this seems to not be doing as well as I would like at this point. I do believe that he would benefit from sharp debridement at this site. Subsequently I think a total contact cast would be helpful for the right foot as well. 08/17/18; the patient arrived today with a total contact cast on the right foot. This actually looks quite good. He had gone to see Dr. Megan Salon of infectious disease about the osteomyelitis on the left foot. I think he is on IV Fortaz at dialysis although I am not exactly sure of the rationale for the Fortaz at the time of this dictation.  He also arrived in today with a swelling on the lateral left foot this is the site of his original wounds in fact when I first saw this man when he was under Dr. Britto's care I think this was the site of where the wound was located. It was not particularly tender however using a small scalpel I opened it to Lee some moderate amount of purulent drainage. We've been  using silver alginate to all wound areas and a total contact cast on the right foot 08/24/18; patient continues on Fortaz at dialysis for osteomyelitis left foot. Last MRI was in May that showed progressive osteomyelitis and bone destruction of the cuboid and base of the fifth metatarsal. Last week he had an abscess over this same area this was removed. Culture was negative. ooWe are continuing with a total contact cast to the area on the third metatarsal head on the right and making some good progress here. The patient asked me again about renal transplant. He is not on the list because of the open wounds. 08/31/18; apparently the patient had an interruption in the Fortaz but he is now back on this at dialysis. Apparently there was a misunderstanding and Dr. Snyder's orders. Due to have the MRI of the left foot tonight. ooThe area on the right foot continues to have callus thick skin and subcutaneous tissue around the wound edge that requires constant debridement however the wound is smaller we are using a total contact cast in this area. Alginate all wounds 09/07/18; without much surprise the MRI of his left foot showed osteomyelitis in the reminiscent of his fourth metatarsal but also the third metatarsal. She arrives in clinic today with the right foot and a total contact cast. There was purulent drainage coming out of the wound which I have cultured. Marked deterioration here with undermining widely around the wound orifice. Using pickups and a scalpel I remove callus and subcutaneous tissue from a substantial new opening. ooIn a similar fashion the area on the left lateral foot that was blistered last week I have opened this and remove skin and subcutaneous tissue from this area to expose a obvious new wound. I think there is extension and communication between all of this on the left foot. ooThe patient is on Fortaz at dialysis. Culture done of the right foot. We are clearly not making progress  here. ooWe have made him an appointment with Dr. Graves of orthopedics. I think an amputation of the left leg may be discussed. I don't think there is anything that can be done with foot salvage. 09/14/18; culture from the right foot last time showed a very resistant MRSA. This is resistant to doxycycline. I'm going to try to get linezolid at least for a week. He does not have a appointment with Dr. Graves yet. 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 He is finished Fortaz at dialysis. I'll send a message to Dr. Hatcher of infectious disease. I want the patient to see Dr. Graves to go over the pros and cons of an amputation which I think will be a BKA. The patient had a question about whether this is curative or not. I told him that I thought it would be although spread of staph aureus infection is not unheard of. MRI of the right foot was done at the end of April 2019 did not show osteomyelitis. The patient last saw Dr. Hatcher on 9/3. I'll send Dr. Hatcher a message. I would   really like him to weigh the pros and cons of a BKA on the left otherwise he'll probably need IV antibiotics and perhaps hyperbaric oxygen again. He has not had a good response to this in the past. His MRI earlier this month showed progressive damage in the remnants of the fourth and third metatarsal 09/21/2018; sees Dr. Graves of orthopedics next week to discuss a left BKA in response to the osteomyelitis in the left foot. Using silver alginate to all wound areas. He is completing the Zyvox I did put in for him after last culture showed MRSA 09/29/2018; patient saw Dr. Graves of orthopedics discussed the osteomyelitis in the left foot third and fourth metatarsals. He did not recommend urgent surgery but certainly stated the only surgical option would be a BKA. He is communicating with Dr. Hatcher. The problem here is the instability of the areas on his foot which constantly generate draining abscesses. I  will communicate with Dr. Hatcher about this. He has an appointment on 10/23 10/05/2018; patient sees Dr. Hatcher on 10/23. I have sent him a secure message not ordered any additional antibiotics for now. Patient continues to have 2 open areas on the left plantar foot at the fifth metatarsal head and the lateral aspect of the foot. These have not changed all that much. The area on the right third met head still has thick callus and surrounding subcutaneous tissue we have been using silver alginate 10/12/2018; patient sees Dr. Hatcher or colleague on 10/23. 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 The area on the right plantar foot is just about closed. The superior wound on the left fifth metatarsal head is callused over but I am not sure if this is closed. The area below it is about the same. Considerable amount of callus on the lateral foot. We have been using silver alginate to the wounds 10/19/2018; the patient saw Dr. Hatcher on 10/22. He wishes to try and continue to save the left foot. He has been given 6 weeks of oral ciprofloxacin. We continue to put a total contact cast on the right foot. The area over the fifth metatarsal head on the left is callused over/perhaps healed but I did not remove the callus to find out. He still has the wound on the left lateral midfoot requiring debridement. We have been using silver alginate to all wound areas 10/26/2018 ooOn the left foot our intake nurse noted some purulent drainage from the inferior wound [currently the only one that is not callused over] ooOn the right foot even though he is in total contact cast a considerable amount of thick black callus and surface eschar. On this side we have been using silver alginate under a total contact cast. oohe remains on ciprofloxacin as prescribed by Dr. Hatcher 11/02/2018; culture from last week which is done of the probing area on the left  midfoot wound showed MRSA "few". I am going to need to contact Dr. Hatcher which I will do today I think he is going to need IV antibiotics again. The area on the left foot which was callused on the side is clearly separating today all of this was removed a copious amounts of callus and necrotic subcutaneous tissue. ooThe area on the right plantar foot actually remained healthy looking with a healthy granulated base. ooWe are using silver alginate to all wound areas 11/09/2018; unfortunately neither 1 of the patient's wounds areas looks at all satisfactory.   On the left he has considerable necrotic debris from the plantar wound laterally over the foot. I removed copious amounts of material including callus skin and subcutaneous tissue. On the right foot he arrives with undermining and frankly purulent drainage. Specimen obtained for culture and debridement of the callused skin and subcutaneous tissue from around the circumference. Because of this I cannot put him back in a total contact cast but to be truthful we are really unfortunately not making a lot of progress I did put in a secure text message to Dr. Hatcher wondering about the MRSA on the left foot. He suggested vancomycin although this is not started. I was left wondering if he expected me to send this into dialysis. Has we have more purulence on the right side wait for that result before calling dialysis 11/16/2018; I called dialysis earlier this week to get the vancomycin started. I think he got the first dose on Tuesday. The patient tells me that he fell earlier this week twisting his left foot and ankle and he is swelling. He continues to not look well. He had blood cultures done at dialysis on Tuesday he is not been informed of the results. 12/07/2018; the patient was admitted to hospital from 11/22/2018 through 12/02/2018. He underwent a left BKA. Apparently had staph sepsis. In the meantime being off the right foot this is closed  over. He follows up with Dr. 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 Subsequent to this he actually had multiple falls in fact he tells me to following which calls the wound to be his apparently. He has been seeing Dr. Graves and his physician assistant in the interim. They actually did want him to come back for reevaluation to see if there's anything we can do for me wound care perspective the help this area to heal more appropriately. The patient states that he does not have a tremendous amount of pain which is good news. No fevers, chills, nausea, or vomiting noted at this time. We have gotten approval from Dr. Graves office had [facility] for us to treat the patient for his stop wound. This is due to the fact that the patient was in the 90 day postop global. 1/21; the patient does not have an open wound on the right foot although he does have some pressure areas that will need to be padded when he is transferring. The dehisced surgical wound looks clean although there is some undermining of 1.5 cm superiorly. 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 This appears healthy but there is undermining. TheraSkin #1 2/11; TheraSkin #2 2/25; TheraSkin #3. Wound is measuring smaller 3/10; TheraSkin #4 wound is measuring smaller 3/24; TheraSkin #5 wound is measuring smaller and looks healthy. 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 He has no open area on the right foot although he does have some callused area over the original third metatarsal head wound. The third metatarsal is also subluxed on the right foot. I have warned him today that he cannot consider wearing a prosthesis for at least a month but he can go for measurements. He is going to need to keep the right foot  padded is much as possible in his diabetic shoe indefinitely READMISSION 06/12/2019 Mr. Nguyenthi is a type II diabetic on dialysis.   He has been in this clinic multiple times with wounds on his bilateral lower extremities. 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 He also has a history of wounds on his right foot but tells me that everything is going well here. He actually came in here with his prosthesis walking with a cane. We were all really quite gratified to see this He states over the last several weeks he has had a painful area at the tip of the left third finger. His dialysis shunt in his is in the left upper arm and this particularly hurts during dialysis when his fingers get numb and there is a lot of pain in the tip of his left third finger. I believe he is seen vascular surgery. He had a Doppler done to evaluate for a dialysis steal syndrome. He is going for a banding procedure 1 week from today towards the left AV fistula. I think if that is unsuccessful they will be looking at creating a new shunt. He had a course of doxycycline when he which he finished about a week ago. He has been using Bactroban to the finger 6/25; the patient had his banding procedure and things seem to be going better. He is not having pain in his hand at dialysis. The area on the tip of his finger seems about closed. He did however have a paronychia I the last time I saw him. I have been advising him to use topical Bactroban washing the finger. Culture I did last time showed a few staph epidermidis which I was willing to dismiss as superficial skin contaminant. He arrives today with the finger wound looking better however the paronychia a seems worse 7/9; 2-week follow-up. He does not have an open wound on the tip of his third left finger. I thought he had an initial ischemic wound on the tip of the finger as well  as a paronychia a medially. All of this appears to be closed. 8/6-Patient returns after 3 weeks for follow-up and has a right second met head plantar callus with a significant area of maceration hiding an ulcer ABI repeated today on right - 1.26 8/13-Patient returns at 1 week, the right second met head plantar wound appears to be slightly better, it is surrounded by the callus area we are using silver alginate 8/20; the patient came back to clinic 2 weeks ago with a wound on the right second metatarsal head. He apparently told me he developed callus in this area but also went away from his custom shoes to a pair of running shoes. Using silver alginate. 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 There might be much we can do to offload this although the patient tells me he has a wheelchair that he is using to try and stay off the wound is much as possible 8/27; right second metatarsal head. Still small punched out area with thick callus and subcutaneous tissue around the wound. We have been using silver collagen. This is always been an issue with this man's wound especially on this foot 9/3; right second met head. Still the same punched-out area with thick callus and subcutaneous tissue around the wound removing the circumference demonstrates repetitively undermining area. I have removed all the subcutaneous tissue associated with this. We have been using silver collagen 9/15; right second metatarsal head. Same punched-out  thick callus and subcutaneous tissue around the wound area. Still requiring debridement we have been using silver collagen I changed back to silver alginate X-ray last time showed no evidence of osteomyelitis. Culture showed Klebsiella and he was started and Keflex apparently just 4 days ago 9/24; right second metatarsal head. He is completed the antibiotics I gave him for 10 days. Same punched-out thick callus and subcutaneous tissue around the area. Still  requiring debridement. We have been using silver alginate. The area itself looks swollen and this could be just [place] that comes on this area with walking on a prosthesis on the left however I think he needs an MRI and I am going to order that today. There is not an option to offload this further 10/1; right second plantar metatarsal head. MRI booked for October 6. Generally looking better today using silver alginate 10/8; right second plantar metatarsal head. MRI that was done on 10/6 was negative for osteomyelitis noted prior amputations of the second and fourth toes at the MTP. Prior amputation of the third toe to the base of the middle phalanx. Prior amputation of the fifth toe to the head of the metatarsal. 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 He does not recall about hearing about this. He did not have any pain but then again he has reduced sensation from diabetic neuropathy 10/15; right second plantar metatarsal head. Still in the same condition callus nonviable subcutaneous tissue which cleans up nicely with debridement but reforms by the next week. The patient tells me that he is offloading this is much as he can including taking his wheelchair to dialysis. We have been using silver alginate, changed to silver collagen today 10/22; right second plantar metatarsal head. Wound measures smaller but still thick callus around this wound. Still requiring debridement 11/5 right second plantar metatarsal head. Less callus around the wound circumference but still requiring debridement. There is still some undermining which I cleaned out the wound looks healthy but still considerable punched out depth with a relatively small circumference to the wound there is no palpable bone no purulent drainage 11/12; right second plantar metatarsal head. Small wound with thick callus tissue around the wound and significant undermining. We have been using  silver collagen after my continuous debridements of this area 11/19; patient arrives in today with purulent drainage coming out of the wound in the second third met head area on the right foot. Serosanguineous thick drainage. Specimen obtained for culture. 11/21/2019 on evaluation today patient appears to be doing well with regard to his foot ulcer. The good news is is culture came back negative for any bacteria there was no growth. The other good news is his x-ray also appears to be doing well. The foot is also measuring much better than what it was. Overall I am very pleased with how things seem to be progressing. 12/22; patient was in [place] for several weeks due to the death of a family member. He said he stayed off the wound using silver alginate. 01/03/2020. Patient has a small open area with roughly 0.3 cm of circumferential undermining. The orifice looks smaller. We have been using silver collagen. There is not a wound more aggressive way to offload this area as he has a prosthesis on the other leg 1/21; again the same small area is 2 weeks ago. We have been using silver collagen I change that to endoform today I really believe that this is an   offloading issue 2/4; the area on the right third met head. We have been using endoform. 2/11 the area on the right third met head we have been using silver alginate. 2/25; the area on the right third met head. There is much less callus on this today. The patient states he is offloading this even more aggressively. As an example he is taking his wheelchair into dialysis on most days 3/4; right third metatarsal head. Again he has eschar over the surface of the wound which I removed with a #3 curette. Some subcutaneous debris. This has 0.8 cm of direct probing depth. I cannot feel any bone here. I did do a culture the area 3/11; right third metatarsal head. Again an eschared area over the wound which almost makes this look superficial. Again  debridement reveals a wound with probing depth probably not much different from last week there is no palpable bone no palpable purulence. The patient tells me that he is making every effort to offload this area properly. He is taking wheelchair into dialysis etc. There is no option for forefoot offloading or a total contact cast. We used Oasis #1 today 3/18; again callus and eschar over the wound surface. I remove this but still a probing wound although only 3 mm in depth this time. This is an improvement 3/25; again callus and eschar over the wound surface which I removed the wound is much deeper today at 0.7 cm. There is no palpable bone. Because of the appearance of this a culture was done. I am not going to put the Oasis back in this. Concerned about underlying infection. Notable for the fact that he is just finished doxycycline for the last go round of this 3/30; still 0.7 cm in depth which is disappointing. Culture I gave last time showed a few Staph aureus which is methicillin susceptible. This should have been taken care of by the recent course of doxycycline I gave him. We are using silver alginate 4/6; almost 1 cm in depth. We have been using silver alginate with underlying Bactroban. 4/15; about 1 cm in depth still. There is no palpable bone but there is undermining thick surface again as usual. Our intake nurse noted what looked to be purulent drainage I suppose that could have been Bactroban but I did a culture for CandS 4/20; depth down 0.7 cm. Culture from last time was negative we have been using Bactroban and silver alginate 4/29; depth down 0.5 cm. Using silver alginate. He reports some drainage 5/6; comes in with the wound worse. Wound is deeper and tunnels. Measures 0.9 depth and an undermining area of 1.8 cm. 5/18; there is a change the dressing last week. Still using Sorbact hydrogel. What I can see of the base of the wound looks better. 6/1. Not much change. I changed him to  endoform today. He has 1.1 cm in depth no palpable bone 6/8; again a small punched out wound with significant undermining. Culture I did last week was negative. T oday he had some purulent drainage perhaps mixed with some retained endoform. 6/22; MRI report below ooIMPRESSION: Skin ulceration on the plantar surface of the foot at the level of the head of the second metatarsal. Mild marrow edema in the head of the second metatarsal could be reactive or due to osteomyelitis. Thin fluid collection surrounding the head and neck of the second metatarsal appears to communicate with the patient's skin wound and is worrisome for a septic collection. Advanced first MTP osteoarthritis. Prior  amputations as noted above. Electronically Signed By: Thomas Dalessio M.D. On: 06/16/2020 11:51 PCR culture I did last time I believe showed peptostreptococcus. There was no comment on any other gram-negative or gram-positive organisms. I gave him a course of Flagyl which I think he is finishing up. We are not making any progress with the wound over the right second/third metatarsal head. We are using silver alginate putting him in kerlix Coban 6/29; has his appointment with Dr. Graves of orthopedics next Tuesday I think we see him on the same day. He was in for a nurse visit today. Nurse noted undermining of skin and callus so I was asked to look at this. We have been using silver alginate and kerlix Coban 7/6; patient saw Dr. Graves PA today will see Dr. Graves on Tuesday. The major question is does he needs surgery to clean out the area in question culture question resect any involved bone etc. Otherwise he will need to see infectious disease for broad-spectrum antibiotics given at dialysis 7/13; I talked to Dr. Graves late last week. He was not impressed with the MRI results thinking that the bone changes may have been reactive rather than osteomyelitis. He did not think he needed drainage of an abscess  thinking of the fluid we are seeing was superficial and contiguous with the actual wound. He did not think he needed surgery or resection. Recommended consideration of infectious disease and return him to our care. We have been using silver alginate 7/20; PCR culture I did last week showed staph aureus methicillin sensitive. I have him on twice daily Keflex. Silver alginate to the wound Objective Constitutional Sitting or standing Blood Pressure is within target range for patient.. Pulse regular and within target range for patient.. Respirations regular, non-labored and within target range.. Temperature is normal and within the target range for the patient.. Appears in no distress. Vitals Time Taken: 1:52 PM, Height: 73 in, Source: Stated, Weight: 202 lbs, Source: Stated, BMI: 26.6, Temperature: 98.6 °F, Pulse: 82 bpm, Respiratory Rate: 18 breaths/min, Blood Pressure: 121/73 mmHg, Capillary Blood Glucose: 109 mg/dl. General Notes: glucose per pt report this am after breakfast Cardiovascular The pulses are palpable. General Notes: Wound exam; small punched out area the area is larger and deeper. No debridement. Integumentary (Hair, Skin) Erythema around the wound. Wound #41 status is Open. Original cause of wound was Gradually Appeared. The wound is located on the Right Metatarsal head second. The wound measures 0.8cm length x 0.6cm width x 0.8cm depth; 0.377cm^2 area and 0.302cm^3 volume. There is Fat Layer (Subcutaneous Tissue) Exposed exposed. There is no tunneling noted, however, there is undermining starting at 7:00 and ending at 12:00 with a maximum distance of 0.5cm. There is a small amount of serosanguineous drainage noted. The wound margin is thickened. There is large (67-100%) red granulation within the wound bed. There is a small (1-33%) amount of necrotic tissue within the wound bed including Adherent Slough. 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 There was a Double Layer Compression Therapy Procedure by Epps, Carrie, RN. Post procedure Diagnosis Wound #41: Same as Pre-Procedure Plan Follow-up Appointments: Return Appointment in 1 week. Dressing Change Frequency: Wound #41   Right Metatarsal head second: Do not change entire dressing for one week. Skin Barriers/Peri-Wound Care: Moisturizing lotion Wound Cleansing: May shower with protection. - patient to use a cast protector. Primary Wound Dressing: Wound #41 Right Metatarsal head second: Calcium Alginate with Silver Secondary Dressing: Wound #41 Right Metatarsal head second: Dry Gauze Other: - felt to offload periwound. Edema Control: Kerlix and Coban - Right Lower Extremity Avoid standing for long periods of time Elevate legs to the level of the heart or above for 30 minutes daily and/or when sitting, a frequency of: - throughout the day Additional Orders / Instructions: Other: - minimize walking and standing on foot to aid in wound healing and callus buildup. #1 I am continuing with silver alginate 2. Another week of Keflex at 500 twice daily 3. If this is not any better next week off on dialysis and see if we can get him on IV antibiotics at dialysis. I remain convinced that there is an underlying infection here. Dr. Graves was not interested in surgery on the foot Electronic Signature(s) Signed: 07/15/2020 6:00:01 PM By: Robson, Weston MD Entered By: Robson, Navjot on 07/15/2020 15:52:45 -------------------------------------------------------------------------------- SuperBill Details Patient Name: Date of Service: Keim, [name]  EL B. 07/15/2020 Medical Record Number: [id number] Patient Account Number: [id number] Date of Birth/Sex: Treating RN: 12/18/1951 (69 y.o. M) Epps, Carrie Primary Care Provider: Jones, [name] Other Clinician: Referring Provider: Treating Provider/Extender: Robson, Dawn [name] Weeks in Treatment: 57 Diagnosis Coding ICD-10 Codes Code Description E11.621 Type 2 diabetes mellitus with foot ulcer L97.512 Non-pressure chronic ulcer of other part of right foot with fat layer exposed I70.298 Other atherosclerosis of native arteries of extremities, other extremity L03.115 Cellulitis of right lower limb Facility Procedures The patient participates with Medicare or their insurance follows the Medicare Facility Guidelines: CPT4 Code Description Modifier Quantity [id number] (Facility Use Only) [id number] - APPLY MULTLAY COMPRS LWR RT LEG 1 Physician Procedures : CPT4 Code Description Modifier [id number] - WC PHYS LEVEL 3 - EST PT ICD-10 Diagnosis Description E11.621 Type 2 diabetes mellitus with foot ulcer L97.512 Non-pressure chronic ulcer of other part of right foot with fat layer exposed Quantity: 1 Electronic Signature(s) Signed: 07/15/2020 6:00:01 PM By: Robson, Johnie MD Entered By: Robson, Aaiden on 07/15/2020 15:53:03 

## 2020-07-22 ENCOUNTER — Encounter (HOSPITAL_BASED_OUTPATIENT_CLINIC_OR_DEPARTMENT_OTHER): Payer: Medicare Other | Admitting: Internal Medicine

## 2020-07-22 DIAGNOSIS — E11621 Type 2 diabetes mellitus with foot ulcer: Secondary | ICD-10-CM | POA: Diagnosis not present

## 2020-07-23 NOTE — Progress Notes (Signed)
[name], [name] ([id number]) Visit Report for 07/22/2020 Debridement Details Patient Name: Date of Service: [name], [name] 07/22/2020 2:30 PM Medical Record Number: [id number] Patient Account Number: [id number] Date of Birth/Sex: Treating RN: 10/31/1951 (68 y.o. [name]) [name] Primary Care Provider: Kristie [name] Other Clinician: Referring Provider: Treating Provider/Extender: [name] in Treatment: 58 Debridement Performed for Assessment: Wound #41 Right Metatarsal head second Performed By: Physician Ricard Dillon., MD Debridement Type: Debridement Severity of Tissue Pre Debridement: Fat layer exposed Level of Consciousness (Pre-procedure): Awake and Alert Pre-procedure Verification/Time Out Yes - 14:47 Taken: Start Time: 14:47 Pain Control: Lidocaine 5% topical ointment T Area Debrided (L x W): otal 0.7 (cm) x 0.6 (cm) = 0.42 (cm) Tissue and other material debrided: Viable, Non-Viable, Slough, Subcutaneous, Skin: Dermis , Skin: Epidermis, Slough Level: Skin/Subcutaneous Tissue Debridement Description: Excisional Instrument: Curette Bleeding: Moderate Hemostasis Achieved: Pressure End Time: 14:51 Procedural Pain: 0 Post Procedural Pain: 0 Response to Treatment: Procedure was tolerated well Level of Consciousness (Post- Awake and Alert procedure): Post Debridement Measurements of Total Wound Length: (cm) 0.7 Width: (cm) 0.6 Depth: (cm) 1 Volume: (cm) 0.33 Character of Wound/Ulcer Post Debridement: Improved Severity of Tissue Post Debridement: Fat layer exposed Post Procedure Diagnosis Same as Pre-procedure Electronic Signature(s) Signed: 07/23/2020 3:52:22 AM By: Linton Ham MD Signed: 07/23/2020 6:04:59 PM By: [name] Coria RN Entered By: Linton Ham on 07/22/2020 15:22:33 -------------------------------------------------------------------------------- HPI Details Patient Name: Date of Service: [name]. 07/22/2020  2:30 PM Medical Record Number: [id number] Patient Account Number: [id number] Date of Birth/Sex: Treating RN: November 11, 1951 (68 y.o. [name] Primary Care Provider: Kristie [name] Other Clinician: Referring Provider: Treating Provider/Extender: [name] in Treatment: 58 History of Present Illness Location: Patient presents with a wound to bilateral feet. Quality: Patient reports experiencing essentially no pain. Severity: Mildly severe wound with no evidence of infection Duration: Patient has had the wound for greater than 2 weeks prior to presenting for treatment HPI Description: Stomatitis cyst the patient is a pleasant 69 yrs old bm here for evaluation of ulcers on the plantar aspect of both feet. 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 He has a left arm graft for access. He has been trying to stay off his feet for weeks but does not seem to have any improvement in the wound. He has been seeing someone at the foot center and was referred to the Wound Care center for further evaluation. 12/30/15 the patient has 3 wounds one over the right first metatarsal head and 2 on the left foot at the left fifth and lleft first metatarsal head. All of these look relatively similar. The one over the left fifth probe to bone I could not prove that any of the others did. I note his MRI in November that did not show osteomyelitis. His peripheral pulses seem robust. All of these underwent surgical debridement to remove callus nonviable skin and subcutaneous tissue 01/06/16; the patient had his sutures removed from the right fifth ray amputation. There may be a small open part of this superiorly but otherwise the incision looks good. Areas over his right first, left first and left fifth metatarsal head all underwent surgical debridement as a varying degree of callus, skin and nonviable subcutaneous tissue. The area that is most  worrisome is the right fifth metatarsal head which has a wound probes precariously close to bone. There is no purulent drainage or erythema  01/13/16; I'm not exactly sure of the status of the right fifth ray amputation site however he follows with Dr. [name] later this week. The area over his right first plantar metatarsal head, left first and fifth plantar metatarsal head are all in the same status. Thick circumferential callus, nonviable subcutaneous tissue. Culture of the left fifth did not culture last week 01/20/16; all of the patient's wounds appear and roughly the same state although his amputation site on the right lateral foot looks better. His wounds over the right first, left first and left fifth metatarsal heads all underwent difficult surgical debridement removing circumferential callus nonviable skin and subcutaneous tissue. There is no overt evidence of infection in these areas. MRI at the end of December of the left foot did not show osteomyelitis, right foot showed osteomyelitis of the right fifth digit he is now status post amputation. 01/27/16 the patient's wounds over his plantar first and fifth metatarsal heads on the left all appear better having been started on a total contact cast last week. They were debridement of circumferential callus and nonviable subcutaneous tissue as was the wound over the first metatarsal head. His surgical incision on the right also had some light surface debridement done. 03/23/2016 -- the patient was doing really well and most of his wounds had almost completely healed but now he came back today with a history of having a discharge from the area of his right foot on the plantar aspect and also between his first and second toe. He also has had some discharge from the left foot. Addendum: I spoke to the PA [name] at the dialysis center whose fax number is [phone]. We discussed the infection the patient has and she will put the patient on  vancomycin and Fortaz until the final culture report is back. We will fax this as soon as available. 03/30/2016 -- left foot x-ray IMPRESSION:No definitive osteomyelitis noted. X-ray of the right foot -- IMPRESSION: 1. Soft tissue swelling. Prior amputation right fifth digit. No acute or focal bony abnormality identified. If osteomyelitis remains a clinical concern, MRI can be obtained. 2. Peripheral vascular disease. His culture reports have grown an MSSA -- and we will fax this report to his hemodialysis center. He was called in a prescription of oral doxycycline but I have told her not to fill this in as he is already on IV antibiotics. 04/06/2016 -- a few days ago, I spoke to the hemodialysis center nurse who had stopped the IV antibiotics and he was given a prescription for doxycycline 100 mg by mouth twice a day for a week and he is on this at the present time. 05/11/2016 -- he has recently seen his PCP this week and his hemoglobin A1c was 7. He is working on his paperwork to get his orthotic shoes. 05/25/2016 -- -- x-ray of the left foot IMPRESSION:No acute bony abnormality. No radiographic changes of acute osteomyelitis. No change since prior study. X-ray of the right foot -- IMPRESSION: Postsurgical changes are seen involving the fifth toe. No evidence of acute osteomyelitis. he developed a large blister on the medial part of his right foot and this opened out and drain fluid. 06/01/2016 -- he has his MRI to be done this afternoon. 06/15/2016 -- MRI of the left forefoot without contrast shows 2 separate regions of cutaneous and subcutaneous edema and possible ulceration and blistering along the ball of the foot. No obvious osteomyelitis identified. MRI of the right foot showed cutaneous and subcutaneous thickening  plantar to the first digit sesamoid with an ulcer crater but no underlying osteomyelitis is identified. 06/22/2016 -- the right foot plantar ulcer has been draining a lot of  seropurulent material for the last few days. 06/30/2016 -- spoke to the dialysis center and I believe I spoke to [employer] a PA at the center who discussed with me and agreed to putting Legrand Como on vancomycin until his cultures arrive. On review of his culture report no WBCs were seen or no organisms were seen and the culture was reincubated for better growth. The final report is back and there were no predominant growth including Streptococcus or Staphylococcus. Clinically though he has a lot of drainage from both wounds a lot of undermining and there is further blebs on the left foot towards the interspace between his first and second toe. 08/10/2016 -- the culture from the right foot showed normal skin flora and there was no Staphylococcus aureus was group A streptococcus isolated. 09/21/2016 -- -- MRI of the right foot was done on 09/13/2016 - IMPRESSION: Findings most consistent with acute osteomyelitis throughout the great toe, sesamoid bones and plantar aspect of the head of the first metatarsal. Fluid in the sheath of the flexor tendon of the great toe could be sympathetic but is worrisome for septic tenosynovitis. First MTP joint effusion worrisome for septic joint. He was admitted to the hospital on 09/11/2016 and treated for a fever with vancomycin and cefepime. He was seen by Dr. Bobby Rumpf of infectious disease who recommended 6 weeks treatment with vancomycin and ceftazidime with his hemodialysis and to continue to see as in the wound clinic. Vascular consult was pending. The patient was discharged home on 09/14/2016 and he would continue with IV antibiotics for 6 weeks. 09/28/16 wound appears reasonably healthy. Continuing with total contact cast 10/05/16 wound is smaller and looking healthy. Continue with total contact cast. He continues on IV antibiotics 10/12/2016 -- he has developed a new wound on the dorsal aspect of his left big toe and this is a superficial injury with no  surrounding cellulitis. He has completed 30 days of IV antibiotics and is now ready to start his hyperbaric oxygen therapy as per his insurance company's recommendation. 10/19/2016 -- after the cast was removed on the right side he has got good resolution of his ulceration on the right plantar foot. he has a new wound on the left plantar foot in the region of his fourth metatarsal and this will need sharp debridement. 10/26/2016 -- Xray of the right foot complete: IMPRESSION: Changes consistent with osteomyelitis involving the head of the first metatarsal and base of the first proximal phalanx. The sesamoid bones are also likely involved given their positioning. 11/02/2016 -- there still awaiting insurance clearance for his hyperbaric oxygen therapy and hopefully he will begin treatment soon. 11/09/2016 -- started with hyperbaric oxygen therapy and had some barotrauma to the right ear and this was seen by ENT who was prescribed Afrin drops and would probably continue with HBO and he is scheduled for myringotomy tubes on Friday 11/16/2016 -- he had his myringotomy tubes placed on Friday and has been doing much better after that with some fluid draining out after hyperbaric treatment today. The pain was much better. 11/30/2016 -- over the last 2 days he noticed a swelling and change of color of his right second toe and this had been draining minimal fluid. 12/07/2016 -- x-ray of the right foot -- IMPRESSION:1. Progressive ulceration at the distal aspect of the second  digit with significant soft tissue swelling and osseous changes in the distal phalanx compatible with osteomyelitis. 2. Chronic osteomyelitis at the first MTP joint. 3. Ulcerations at the second and third toes as well without definite osseous changes. Osteomyelitis is not excluded. 4. Fifth digit amputation. On 12/01/2016 I spoke to Dr. Bobby Rumpf, the infectious disease specialist, who kindly agreed to treat this with IV antibiotics  and he would call in the order to the dialysis center and this has been discussed in detail with the patient who will make the appropriate arrangements. The patient will also book an appointment as soon as possible to see Dr. [name] in the office. Of note the patient has not been on antibiotics this entire week as the dialysis center did not receive any orders from Dr. [name]. I got in touch with Dr. [name] who tells me the patient has an appointment to see him this coming Wednesday. 12/14/2016 -- the patient is on ceftazidime and vancomycin during his dialysis, and I understand this was put on by his nephrologist Dr. Raliegh Ip possibly after speaking with infectious disease Dr. [name] 12/23/16; patient was on my schedule today for a wound evaluation as he had difficulties with his schedule earlier this week. I note that he is on vancomycin and ceftazidine at dialysis. In spite of this he arrives today with a new wound on the base of the right second toe this easily probes to bone. The known wound at the tip of the second toe With this area. He also has a superficial area on the medial aspect of the third toe on the side of the DIP. This does not appear to have much depth. The area on the plantar left foot is a deep area but did not probe the bone 12/28/16 -- he has brought in some lab work and the most recent labs done showed a hemoglobin of 12.1 hematocrit of 36.3, neutrophils of 51% WBC count of 5.6, BN of 53, albumin of 4.1 globulin of 3.7, vancomycin 13 g per mL. 01/04/2017 -- he saw Dr. [name] who has recommended a amputation of the right second toe and he is awaiting this date. He sees Dr. Bobby Rumpf of infectious disease tomorrow. The bone culture taken on 12/28/2016 had no growth in 2 days. 01/11/2017 -- was seen by Dr. Bobby Rumpf regarding the management and has recommended a eval by vascular surgeons. He recommended to continue the antibiotics during his hemodialysis. Xray of the left  foot -- IMPRESSION: No acute fracture, dislocation, or osseous erosion identified. 01/18/2017 -- he has his vascular workup later today and he is going to have his right foot second toe amputation this coming Friday by Dr. [name]. We have also put in for a another 30 treatments with hyperbaric oxygen therapy 01/25/2017 -- I have reviewed Dr. Donnetta Hutching is vascular report from last week where he reviewed him and thought his vascular function was good enough to heal his amputation site and no further tests were recommended. He also had his orthopedic related to surgery which is still pending the notes and we will review these next week. 02/01/2017 -- the operative remote of Dr. 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 02/08/2017 --he was seen by Dr. [name] on 02/03/2017 -- patient is on [place]. after a thorough review he had recommended to continue with antibiotics during hemodialysis. The patient was seen by Dr. [name], but I do not find any follow-up note on the electronic medical record. he has removed the dressing over the right foot to remove the sutures and asked him to see him back in a month's time 02/22/2017 -- patient has been febrile and has been having symptoms of the upper respiratory tract infection but has not been checked for the flu and it's been over 4 days now. He says he is feeling better today. Some drainage between his left first and second toe and this needed to be looked at 03/08/2017 -- was seen by Dr. Bobby Rumpf on 03/07/2017 after review he will stop his antibiotics and see him back in a month to see if he is  feeling well. 03/15/2017 -- he has completed his course of hyperbaric oxygen therapy and is doing fine with his health otherwise. 03/22/2017 -- his nutritionist at the dialysis center has recommended a protein supplement to help build his collagen and we will prescribe this for him when he has the details 05/03/2017 -- he recently noticed the area on the plantar aspect of his fifth metatarsal head which opened out and had minimal drainage. 05/10/2017 -- -- x-ray of the left foot -- IMPRESSION:1. No convincing conventional radiographic evidence of active osteomyelitis.2. Active soft tissue ulceration at the tip of the fifth digit, and at the lateral aspect of the foot adjacent the base of the fifth metatarsal. 3. Surgical changes of prior fourth toe amputation. 4. 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 5. Small vessel atherosclerotic vascular calcifications. 6. 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 ======= Readmission after 5 weeks: 06/15/2017 -- the patient returns after 5 weeks having had an MRI done on 05/17/2017 MRI of the left foot without contrast showed soft tissue ulcer overlying the base of the fifth metatarsal. 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 No drainable fluid collection to suggest an abscess. He was in hospital between 05/16/2017 and 05/25/2017 -- and on discharge was asked to follow-up with Dr. [name] and Dr. [name]. He was treated 2 weeks post discharge with vancomycin plus ceftriaxone with hemodialysis and oral metronidazole 500 mg 3 times a day. 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 last hemoglobin A1c was 6.7% He had a postoperative MR of the foot on 05/23/2017 -- which showed no new areas of cortical bone loss, edema or soft tissue ulceration to  suggest osteomyelitis. the patient has completed his course of IV antibiotics during dialysis and is to see the infectious disease doctor tomorrow. Dr. [name] had seen him after suture removal and asked him to keep the wound with a dry dressing 06/21/2017 -- he was seen by Dr. Bobby Rumpf of infectious disease on 06/16/2017 -- he stopped his Flagyl and will see him back in 6 weeks. He was asked to follow-up with Dr. [name] and with the wound clinic clinic. 07/05/17; bone biopsy from last time showed acute osteomyelitis. This is from the reminiscent in the left fifth metatarsal. Culture result is apparently still pending [holding for anaerobe}. From my understanding in this case this is been a progressive necrotic wound which is deteriorated markedly  over the last 3 weeks since he returned here. He now has a large area of exposed bone which was biopsied and cultured last week. Dr. Graylon Good has put him on vancomycin and Fortaz during his hemodialysis and Flagyl orally. He is to see Dr. [name] next week 07/19/2017 -- the patient was reviewed by Dr. [name] of orthopedics who reviewed the case in detail and agreed with the plan to continue with IV antibiotics, aggressive wound care and hyperbaric oxygen therapy. He would see him back in 3 weeks' time 08/09/2017 -- saw Dr. Bobby Rumpf on 08/03/2017 -he was restarted on his antibiotics for 6 weeks which included vanco, ceftaz and Flagyl. he recommended continuing his antibiotics for 6 weeks and reevaluate his completion date. He would continue with wound care and hyperbaric oxygen therapy. 08/16/2017 -- I understand he will be completing his 6 weeks of antibiotics sometime later this week. 09/19/2017 -- he has been without his wound VAC for the last week due to lack of supplies. He has been packing his wound with silver alginate. 10/04/2017 -- he has an appointment with Dr. [name] tomorrow and hence we will not apply the wound VAC after his dressing  changes today. 10/11/2017 -- he was seen by Dr. Bobby Rumpf on 10/05/2017, noted that the patient was currently off antibiotics, and after thorough review he recommended he follow-up with the wound care as per plan and no further antibiotics at this point. 11/29/17; patient is arrived for the wound on his left lateral foot.Marland Kitchen He is completed IV antibiotics and 2 rounds of hyperbaric oxygen for treatment of underlying osteomyelitis. He arrives today with a surface on most of the wound area however our intake nurse noted drainage from the superior aspect. This was brought to my attention. 12/13/2017 -- the right plantar foot had a large bleb and once the bleb was opened out a large callus and subcutaneous debris was removed and he has a plantar ulcer near the fourth metatarsal head 12/28/17 on evaluation today patient appears to be doing acutely worse in regard to his left foot. The wound which has been appearing to do better as now open up more deeply there is bone palpable at the base of the wound unfortunately. He tells me that this feels "like it did when he had osteomyelitis previously" he also noted that his second toe on the left foot appears to be doing worse and is swollen there does appear to be some fluid collected underneath. His right foot plantar ulcer appears to be doing somewhat better at this point and there really is no complication at the site currently. No fevers, chills, nausea, or vomiting noted at this time. Patient states that he normally has no pain at this site however. T oday he is having significant pain. 01/03/18; culture done last week showed methicillin sensitive staph aureus and group B strep. He was on [place] however he arrives today with a fever of 101. He has a functional dialysis shunt in his left arm but has had no pain here. No cough. He still makes some urine no dysuria he did have abdominal pain nausea and diarrhea over the weekend but is not had any diarrhea since  yesterday. Otherwise he has no specific complaints 01/05/18; the patient returns today in follow-up for his presentation from 1/8. At that point he was febrile. I gave him some Levaquin adjusted for his dialysis status. He tells me the fever broke that night and it is not likely that this was actually  a wound infection. He is gone on to have an MRI of the left foot; this showed interval development of an abnormal signal from the base of the third and fourth metatarsal and in the cuboid reminiscent consistent with osteomyelitis. There is no mention of the left second toe I think which was a concern when it was ordered. The patient is taking his Levaquin 01/10/18; the patient has completed his antibiotics today. The area on the left lateral foot is smaller but it still probes easily to bone. He has underlying osteomyelitis here and sees Dr. Megan Salon of infectious disease this week The area on the right third plantar metatarsal head still requires debridement not much change in dimensions He has a new wound on the medial tip of his right third toe again this probes to bone. He only noticed this 2 days ago 01/17/18; the patient saw Dr. [name] last week and he is back on Vanco and Fortaz at dialysis. 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 We are previously seeing him for a wound also on the base of the fourth med head on the right. X-ray of this area did not show osteomyelitis in relation to a new wound on the tip of his right third toe. Again this probes to bone. Finally his second toe on the left which didn't really show anything on the MRI at least no report appears to have a separated cutaneous area which I think is going to come right off the tip of his toe and leave exposed bone. Whether this is infectious or ischemic I am not clear Culture I did from the third toe last week which was  his new wound was negative. Plain x-ray on the right foot did not show osteomyelitis in the toes 01/24/18; the patient is going went to see Dr. [name]. He is going to have a amputation of the left second and right third toe although paradoxically the left second toe looks better than last week. We have treating a deep probing wound on the left lateral foot and the area over the third metatarsal head on the right foot. 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 Also a debridement including bone of the left lateral foot and a closure which is still sutured. This is a bit surprising. Also apparent debridement of the base of the right third toe wound. 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 I'm not sure exactly where we are in the frame of things here. The wound on the left lateral foot is still sutured also the amputation sites of the left second and right third toe. He is been using Xeroform to the sutured areas silver alginate to the right foot 02/14/18; he continues on Rocephin at dialysis for osteomyelitis. I still don't have a good sense of where we are in the treatment duration. The wounds on the left lateral foot had the sutures removed and this clearly still probes deeply. We debrided the area with a #5 curet. We'll use silver alginate to the wound the area over the right third met head also required debridement of callus skin and subcutaneous tissue and necrotic debris  over the wound surface. This tunnels superiorly but I did not unroofed this today. The areas on the tip of his toes have a crusted surface eschar I did not debridement this either. 02/21/18; he continues on Rocephin at dialysis for osteomyelitis. I don't have a good sense of where we are and the  treatment duration. The wounds on the left lateral foot is callused over on presentation but requires debridement. The area on the right third med head plantar aspect actually is measuring smaller 02/28/18;patient is on Vanco and Fortaz at dialysis, I'm not sure why I thought this was Rocephin above unless it is been changed. I don't have a good sense of time frame here. Using silver collagen to the area on the left lateral foot and right third med head plantar foot 03/07/18; patient is completing bank and Fortaz at dialysis soon. He is using Silver collagen to the area on the left lateral foot and the right third metatarsal head. He has a smaller superficial wound distal to the left lateral foot wound. 03/14/18-he is here in follow-up evaluation for multiple ulcerations to his bilateral feet. He presents with a new ulceration to the plantar aspect of the left foot underneath the blister, this was deroofed to reveal a partial thickness ulcer. He is voicing no complaints or concerns, tolerated dialysis yesterday. We will continue with same treatment plan and he will follow-up next week 03/21/18; the patient has a area on the lateral left foot which still has a small probing area. The overall surface area of the wound is better. He presented with a new ulceration on the left foot plantar fifth metatarsal head last week. The area on the right third metatarsal head appears smaller.using silver alginate to all wound areas. The patient is changing the dressing himself. He is using a Darco forefoot offloading are on the right and a healing sandal on the left 03/28/18; original wound left lateral foot. Still nowhere close to looking like a heeling surface secondary wound on the left lateral foot at the base of his question fifth metatarsal which was new last week Right third metatarsal head. We have been using silver alginate all wounds 04/04/18; the original wound on the left lateral foot again heavy callus  surrounding thick nonviable subcutaneous tissue all requiring debridement. The new wound from 2 weeks ago just near this at the base of the fifth metatarsal head still looks about the same Unfortunately there is been deterioration in the third medical head wound on the right which now probes to bone. I must say this was a superficial wound at one point in time and I really don't have a good frame of reference year. I'll have to go back to look through records about what we know about the right foot. 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 He is also been to see Dr. [name]. Somewhere in here as somebody is ordered a VASCULAR evaluation which was done on 03/22/18. On the right is anterior tibial artery was monophasic triphasic at the posterior tibial artery. On the left anterior tibial artery monophasic posterior tibial artery monophasic. ABI in the right was 1.43 on the left 1.21. TBIs on the right at 0.41 and on the left at 0.32. A vascular consult was recommended and I think has been arranged. 04/11/18; Mr. Wakefield has not had an MRI of the right foot since 2017. Recent x-ray of the right foot  done in January and February was negative. However he has had a major deterioration in the wound over the third met head. He is completed antibiotics last week at dialysis [place]. I think he is going to see vascular in follow-up. The areas on the left lateral foot and left plantar fifth metatarsal head both look satisfactory. I debrided both of these areas although the tissue here looks good. The area on the left lateral foot once probe to bone it certainly does not do that now 04/18/18; MRI of the right foot is on Thursday. He has what looks to be serosanguineous purulent drainage coming out of the wound over the right third metatarsal head today. He completed antibiotics bank and Fortaz 2 weeks ago at  dialysis. He has a vascular follow-up with regards to his arterial insufficiency although I don't exactly see when that is booked. The areas on the left foot including the lateral left foot and the plantar left fifth metatarsal head look about the same. 04/25/18-He is here in follow-up evaluation for bilateral foot ulcers. MRI obtained was negative for osteomyelitis. Wound culture was negative. We will continue with same treatment plan he'll follow-up next week 05/02/18; the right plantar foot wound over the third metatarsal head actually looks better than when I last saw this. His MRI was negative for osteomyelitis wound culture was negative. On the left plantar foot both wounds on the plantar fifth metatarsal head and on the lateral foot both are covered in a very hard circumferential callus. 05/09/18; right plantar foot wound over the third metatarsal head stable from last week. Left plantar foot wound over the fifth metatarsal head also stable but with callus around both wound areas The area on the left lateral foot had thick callus over a surface and I had some thoughts about leaving this intact however it felt boggy. We've been using silver alginate all wounds 05/16/18; since the patient was last here he was hospitalized from 5/15 through 5/19. 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 This grew MRSA. He was placed on vancomycin.MRI of the left foot suggested "progressive" osteomyelitis and bone destruction of the cuboid and the base of the fifth metatarsal. Stable erosive changes of the base of the second metatarsal. I'm wondering if they're aware that he had surgery and debridement in the area of the underlying bone previously by Dr. [name]. In any case, He was also revascularized with an angioplasty and stenting of the left tibial peroneal trunk and angioplasty of the left posterior tibial  artery. He is on vancomycin at dialysis. He has a 2 week follow-up with infectious disease. He has vascular surgery follow-up. He was started on Plavix. 05/23/18; the patient's wound on the lateral left foot at the level of the fifth metatarsal head and the plantar wound on the plantar fifth metatarsal head both look better. The area on the right third metatarsal head still has depth and undermining. We've been using silver alginate. He is on vancomycin. He has been revascularized on the left 05/30/18; he continues on vancomycin at dialysis. Revascularized on the left. The area on the left lateral foot is just about closed. Unfortunately the area over the plantar fifth metatarsal head undermining superiorly as does the area over the right third metatarsal head. Both of these significantly deteriorated from last week. He has appointments next week with infectious disease and orthopedics I  delayed putting on a cast on the left until those appointments which therefore we made we'll bring him in on Friday the 14th with the idea of a cast on the left foot 06/06/18; he continues on vancomycin at dialysis. Revascularized on the left. He arrives today with a ballotable swelling just above the area on the left lateral foot where his previous wound was. He has no pain but he is reasonably insensate. The difficult areas on the plantar left fifth metatarsal head looks stable whereas the area on the right third plantar metatarsal head about the same as last week there is undermining here although I did not unroofed this today. He required a considerable debridement of the swelling on the left lateral foot area and I unroofed an abscess with a copious amount of brown purulent material which I obtained for culture. I don't believe during his recent hospitalization he had any further imaging although I need to review this. Previous cultures from this area done in this clinic showed MRSA, I would be surprised if this is  not what this is currently even though he is on vancomycin. 06/13/18; he continues on vancomycin at dialysis however he finishes this on Sunday and then graduates the doxycycline previously prescribed by Dr. [name]. The abscess site on the lateral foot that I unroofed last time grew moderate amounts of methicillin-resistant staph aureus that is both vancomycin and tetracycline sensitive so we should be okay from that regard. He arrives today in clinic with a connection between the abscess site and the area on the lateral foot. We've been using silver alginate to all his wound areas. The right third metatarsal head wound is measuring smaller. Using silver alginate on both wound areas 06/20/18; transitioning to doxycycline prescribed by Dr. [name]. The abscess site on the lateral foot that I unroofed 2 weeks ago grew MRSA. That area has largely closed down although the lateral part of his wound on the fifth metatarsal head still probes to bone. I suspect all of this was connected. On the right third metatarsal head smaller looking wound but surrounded by nonviable tissue that once again requires debridement using silver alginate on both areas 06/27/18; on doxycycline 100 twice a day. The abscess on the lateral foot has closed down. Although he still has the wound on the left fifth metatarsal head that extends towards the lateral part of the foot. The most lateral part of this wound probes to bone Right third metatarsal head still a deep probing wound with undermining. Nevertheless I elected not to debridement this this week If everything is copious static next week on the left I'm going to attempt a total contact cast 07/04/18; he is tolerating his doxycycline. The abscess on the left lateral foot is closed down although there is still a deep wound here that probes to bone. We will use silver collagen under the total contact cast Silver collagen to the deep wound over the right third metatarsal head is  well 07/11/18; he is tolerating doxycycline. The left lateral fifth plantar metatarsal head still probes deeply but I could not probe any bone. We've been using silver collagen and this will be the second week under the total contact cast Silver collagen to deep wound over the right third metatarsal head as well 07/18/18; he is still taking doxycycline. The left lateral fifth plantar metatarsal this week probes deeply with a large amount of exposed bone. Quite a deterioration. He required extensive debridement. Specimens of bone for pathology and CNS obtained  Also considerable debridement on the right third metatarsal head 07/25/18; bone for pathology last week from the lateral left foot showed osteomyelitis. CandS of the bone Enterobacter. And Klebsiella. He is now on ciprofloxacin and the doxycycline is stopped [Dr. [name] of infectious disease] area He has an appointment with Dr. [name] tomorrow I'm going to leave the total contact cast off for this week. May wish to try to reapply that next week or the week after depending on the wound bed looks. I'm not sure if there is an operative option here, previously is followed with Dr. [name] 08/01/18 on evaluation today patient presents for reevaluation. He has been seen by infectious disease, Dr. [name], and he has placed the patient back on Ceftaz currently to be given to him at dialysis three times a week. 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 Fortunately there does not appear to be the evidence of infection at this point in time. Overall the patient has been tolerating the dressing changes without complication. We are no longer due lives in the cast of left foot secondary to the infection obviously. 08/10/18 on evaluation today patient appears to be doing rather well all things considered in regard to his left plantar foot ulcer. He does have a significant callous on the  lateral portion of his left foot he wonders if I can help clean that away to some degree today. Fortunately he is having no evidence of infection at this time which is good news. No fevers chills noted. With that being said his right plantar foot actually does have a significant callous buildup around the wound opening this seems to not be doing as well as I would like at this point. I do believe that he would benefit from sharp debridement at this site. Subsequently I think a total contact cast would be helpful for the right foot as well. 08/17/18; the patient arrived today with a total contact cast on the right foot. This actually looks quite good. He had gone to see Dr. Megan Salon of infectious disease about the osteomyelitis on the left foot. I think he is on IV Fortaz at dialysis although I am not exactly sure of the rationale for the Fortaz at the time of this dictation. He also arrived in today with a swelling on the lateral left foot this is the site of his original wounds in fact when I first saw this man when he was under Dr. Ardeen Garland care I think this was the site of where the wound was located. It was not particularly tender however using a small scalpel I opened it to [place] some moderate amount of purulent drainage. 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 Last MRI was in May that showed progressive osteomyelitis and bone destruction of the cuboid and base of the fifth metatarsal. Last week he had an abscess over this same area this was removed. Culture was negative. We are continuing with a total contact cast to the area on the third metatarsal head on the right and making some good progress here. The patient asked me again about renal transplant. He is not on the list because of the open wounds. 08/31/18; apparently the patient had an interruption in the [place] but  he is now back on this at dialysis. Apparently there was a  misunderstanding and Dr. Crissie Figures orders. Due to have the MRI of the left foot tonight. The area on the right foot continues to have callus thick skin and subcutaneous tissue around the wound edge that requires constant debridement however the wound is smaller we are using a total contact cast in this area. Alginate all wounds 09/07/18; without much surprise the MRI of his left foot showed osteomyelitis in the reminiscent of his fourth metatarsal but also the third metatarsal. She arrives in clinic today with the right foot and a total contact cast. There was purulent drainage coming out of the wound which I have cultured. Marked deterioration here with undermining widely around the wound orifice. Using pickups and a scalpel I remove callus and subcutaneous tissue from a substantial new opening. In a similar fashion the area on the left lateral foot that was blistered last week I have opened this and remove skin and subcutaneous tissue from this area to expose a obvious new wound. I think there is extension and communication between all of this on the left foot. The patient is on Fortaz at dialysis. Culture done of the right foot. We are clearly not making progress here. We have made him an appointment with Dr. [name] of orthopedics. I think an amputation of the left leg may be discussed. I don't think there is anything that can be done with foot salvage. 09/14/18; culture from the right foot last time showed a very resistant MRSA. This is resistant to doxycycline. I'm going to try to get linezolid at least for a week. He does not have a appointment with Dr. [name] yet. 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 I'll send a message to Dr. [name] of infectious disease. I want the patient to see Dr. [name] to go over the pros and cons of an amputation which I think will be a BKA. The  patient had a question about whether this is curative or not. I told him that I thought it would be although spread of staph aureus infection is not unheard of. MRI of the right foot was done at the end of April 2019 did not show osteomyelitis. The patient last saw Dr. [name] on 9/3. I'll send Dr. [name] a message. I would really like him to weigh the pros and cons of a BKA on the left otherwise he'll probably need IV antibiotics and perhaps hyperbaric oxygen again. He has not had a good response to this in the past. His MRI earlier this month showed progressive damage in the remnants of the fourth and third metatarsal 09/21/2018; sees Dr. [name] of orthopedics next week to discuss a left BKA in response to the osteomyelitis in the left foot. Using silver alginate to all wound areas. He is completing the Zyvox I did put in for him after last culture showed MRSA 09/29/2018; patient saw Dr. [name] of orthopedics discussed the osteomyelitis in the left foot third and fourth metatarsals. He did not recommend urgent surgery but certainly stated the only surgical option would be a BKA. He is communicating with Dr. [name]. The problem here is the instability of the areas on his foot which constantly generate draining abscesses. I will communicate with Dr. [name] about this. He has an appointment on 10/23 10/05/2018; patient sees Dr. [name] on 10/23. I have sent him a secure message not ordered any additional antibiotics for now. Patient continues to have 2 open areas on the left  plantar foot at the fifth metatarsal head and the lateral aspect of the foot. These have not changed all that much. The area on the right third met head still has thick callus and surrounding subcutaneous tissue we have been using silver alginate 10/12/2018; patient sees Dr. [name] or colleague on 10/23. 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 The area on the right  plantar foot is just about closed. The superior wound on the left fifth metatarsal head is callused over but I am not sure if this is closed. The area below it is about the same. Considerable amount of callus on the lateral foot. We have been using silver alginate to the wounds 10/19/2018; the patient saw Dr. [name] on 10/22. He wishes to try and continue to save the left foot. He has been given 6 weeks of oral ciprofloxacin. We continue to put a total contact cast on the right foot. The area over the fifth metatarsal head on the left is callused over/perhaps healed but I did not remove the callus to find out. He still has the wound on the left lateral midfoot requiring debridement. We have been using silver alginate to all wound areas 10/26/2018 On the left foot our intake nurse noted some purulent drainage from the inferior wound [currently the only one that is not callused over] On the right foot even though he is in total contact cast a considerable amount of thick black callus and surface eschar. On this side we have been using silver alginate under a total contact cast. he remains on ciprofloxacin as prescribed by Dr. [name] 11/02/2018; culture from last week which is done of the probing area on the left midfoot wound showed MRSA "few". I am going to need to contact Dr. [name] which I will do today I think he is going to need IV antibiotics again. The area on the left foot which was callused on the side is clearly separating today all of this was removed a copious amounts of callus and necrotic subcutaneous tissue. The area on the right plantar foot actually remained healthy looking with a healthy granulated base. We are using silver alginate to all wound areas 11/09/2018; unfortunately neither 1 of the patient's wounds areas looks at all satisfactory. On the left he has considerable necrotic debris from the plantar wound laterally over the foot. I removed copious amounts of material  including callus skin and subcutaneous tissue. On the right foot he arrives with undermining and frankly purulent drainage. Specimen obtained for culture and debridement of the callused skin and subcutaneous tissue from around the circumference. Because of this I cannot put him back in a total contact cast but to be truthful we are really unfortunately not making a lot of progress I did put in a secure text message to Dr. [name] wondering about the MRSA on the left foot. He suggested vancomycin although this is not started. I was left wondering if he expected me to send this into dialysis. Has we have more purulence on the right side wait for that result before calling dialysis 11/16/2018; I called dialysis earlier this week to get the vancomycin started. I think he got the first dose on Tuesday. The patient tells me that he fell earlier this week twisting his left foot and ankle and he is swelling. He continues to not look well. He had blood cultures done at dialysis on Tuesday he is not been informed of the  results. 12/07/2018; the patient was admitted to hospital from 11/22/2018 through 12/02/2018. He underwent a left BKA. Apparently had staph sepsis. In the meantime being off the right foot this is closed over. He follows up with Dr. 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 Subsequent to this he actually had multiple falls in fact he tells me to following which calls the wound to be his apparently. He has been seeing Dr. [name] and his physician assistant in the interim. They actually did want him to come back for reevaluation to see if there's anything we can do for me wound care perspective the help this area to heal more appropriately. The patient states that he does not have a tremendous amount of pain which is good news. No fevers, chills, nausea, or vomiting noted at this time.  We have gotten approval from Dr. [name] office had [place] for [place] to treat the patient for his stop wound. This is due to the fact that the patient was in the 90 day postop global. 1/21; the patient does not have an open wound on the right foot although he does have some pressure areas that will need to be padded when he is transferring. The dehisced surgical wound looks clean although there is some undermining of 1.5 cm superiorly. 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 This appears healthy but there is undermining. TheraSkin #1 2/11; TheraSkin #2 2/25; TheraSkin #3. Wound is measuring smaller 3/10; TheraSkin #4 wound is measuring smaller 3/24; TheraSkin #5 wound is measuring smaller and looks healthy. 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 He has no open area on the right foot although he does have some callused area over the original third metatarsal head wound. The third metatarsal is also subluxed on the right foot. I have warned him today that he cannot consider wearing a prosthesis for at least a month but he can go for measurements. He is going to need to keep the right foot padded is much as possible in his diabetic shoe indefinitely READMISSION 06/12/2019 Mr. Mandarino is a type II diabetic on dialysis. He has been in this clinic multiple times with wounds on his bilateral lower extremities. 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 He also has a history of wounds on his right foot but tells me that everything is going well here. He actually came in here with his prosthesis walking with a cane. We were all really quite gratified to see this He states over the last several weeks he has had a painful area at the tip of the left third finger. His dialysis shunt in his is in  the left upper arm and this particularly hurts during dialysis when his fingers get numb and there is a lot of pain in the tip of his left third finger. I believe he is seen vascular surgery. He had a Doppler done to evaluate for a dialysis steal syndrome. He is going for a banding procedure 1 week from today towards the left AV fistula. I think if that is unsuccessful they will be looking at creating a new shunt. He had a course of doxycycline when he which he  finished about a week ago. He has been using Bactroban to the finger 6/25; the patient had his banding procedure and things seem to be going better. He is not having pain in his hand at dialysis. The area on the tip of his finger seems about closed. He did however have a paronychia I the last time I saw him. I have been advising him to use topical Bactroban washing the finger. Culture I did last time showed a few staph epidermidis which I was willing to dismiss as superficial skin contaminant. He arrives today with the finger wound looking better however the paronychia a seems worse 7/9; 2-week follow-up. He does not have an open wound on the tip of his third left finger. I thought he had an initial ischemic wound on the tip of the finger as well as a paronychia a medially. All of this appears to be closed. 8/6-Patient returns after 3 weeks for follow-up and has a right second met head plantar callus with a significant area of maceration hiding an ulcer ABI repeated today on right - 1.26 8/13-Patient returns at 1 week, the right second met head plantar wound appears to be slightly better, it is surrounded by the callus area we are using silver alginate 8/20; the patient came back to clinic 2 weeks ago with a wound on the right second metatarsal head. He apparently told me he developed callus in this area but also went away from his custom shoes to a pair of running shoes. Using silver alginate. He of course has the left BKA and prosthesis on  the left side. There might be much we can do to offload this although the patient tells me he has a wheelchair that he is using to try and stay off the wound is much as possible 8/27; right second metatarsal head. Still small punched out area with thick callus and subcutaneous tissue around the wound. We have been using silver collagen. This is always been an issue with this man's wound especially on this foot 9/3; right second met head. Still the same punched-out area with thick callus and subcutaneous tissue around the wound removing the circumference demonstrates repetitively undermining area. I have removed all the subcutaneous tissue associated with this. We have been using silver collagen 9/15; right second metatarsal head. Same punched-out thick callus and subcutaneous tissue around the wound area. Still requiring debridement we have been using silver collagen I changed back to silver alginate X-ray last time showed no evidence of osteomyelitis. Culture showed Klebsiella and he was started and Keflex apparently just 4 days ago 9/24; right second metatarsal head. He is completed the antibiotics I gave him for 10 days. Same punched-out thick callus and subcutaneous tissue around the area. Still requiring debridement. We have been using silver alginate. The area itself looks swollen and this could be just [place] that comes on this area with walking on a prosthesis on the left however I think he needs an MRI and I am going to order that today. There is not an option to offload this further 10/1; right second plantar metatarsal head. MRI booked for October 6. Generally looking better today using silver alginate 10/8; right second plantar metatarsal head. MRI that was done on 10/6 was negative for osteomyelitis noted prior amputations of the second and fourth toes at the MTP. Prior amputation of the third toe to the base of the middle phalanx. Prior amputation of the fifth toe to the head of the  metatarsal. 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 He does not recall about hearing about this. He did not have any pain but then again he has reduced sensation from diabetic neuropathy 10/15; right second plantar metatarsal head. Still in the same condition callus nonviable subcutaneous tissue which cleans up nicely with debridement but reforms by the next week. The patient tells me that he is offloading this is much as he can including taking his wheelchair to dialysis. We have been using silver alginate, changed to silver collagen today 10/22; right second plantar metatarsal head. Wound measures smaller but still thick callus around this wound. Still requiring debridement 11/5 right second plantar metatarsal head. Less callus around the wound circumference but still requiring debridement. There is still some undermining which I cleaned out the wound looks healthy but still considerable punched out depth with a relatively small circumference to the wound there is no palpable bone no purulent drainage 11/12; right second plantar metatarsal head. Small wound with thick callus tissue around the wound and significant undermining. We have been using silver collagen after my continuous debridements of this area 11/19; patient arrives in today with purulent drainage coming out of the wound in the second third met head area on the right foot. Serosanguineous thick drainage. Specimen obtained for culture. 11/21/2019 on evaluation today patient appears to be doing well with regard to his foot ulcer. The good news is is culture came back negative for any bacteria there was no growth. The other good news is his x-ray also appears to be doing well. The foot is also measuring much better than what it was. Overall I am very pleased with how things seem to be progressing. 12/22; patient was in [facility] for several weeks due to the death of a family  member. He said he stayed off the wound using silver alginate. 01/03/2020. Patient has a small open area with roughly 0.3 cm of circumferential undermining. The orifice looks smaller. We have been using silver collagen. There is not a wound more aggressive way to offload this area as he has a prosthesis on the other leg 1/21; again the same small area is 2 weeks ago. We have been using silver collagen I change that to endoform today I really believe that this is an offloading issue 2/4; the area on the right third met head. We have been using endoform. 2/11 the area on the right third met head we have been using silver alginate. 2/25; the area on the right third met head. There is much less callus on this today. The patient states he is offloading this even more aggressively. As an example he is taking his wheelchair into dialysis on most days 3/4; right third metatarsal head. Again he has eschar over the surface of the wound which I removed with a #3 curette. Some subcutaneous debris. This has 0.8 cm of direct probing depth. I cannot feel any bone here. I did do a culture the area 3/11; right third metatarsal head. Again an eschared area over the wound which almost makes this look superficial. Again debridement reveals a wound with probing depth probably not much different from last week there is no palpable bone no palpable purulence. The patient tells me that he is making every effort to offload this area properly. He is taking wheelchair into dialysis etc. There is no option for forefoot offloading or a total contact cast. We used Oasis #1 today 3/18; again callus and eschar over  the wound surface. I remove this but still a probing wound although only 3 mm in depth this time. This is an improvement 3/25; again callus and eschar over the wound surface which I removed the wound is much deeper today at 0.7 cm. There is no palpable bone. Because of the appearance of this a culture was done. I am not  going to put the Oasis back in this. Concerned about underlying infection. Notable for the fact that he is just finished doxycycline for the last go round of this 3/30; still 0.7 cm in depth which is disappointing. Culture I gave last time showed a few Staph aureus which is methicillin susceptible. This should have been taken care of by the recent course of doxycycline I gave him. We are using silver alginate 4/6; almost 1 cm in depth. We have been using silver alginate with underlying Bactroban. 4/15; about 1 cm in depth still. There is no palpable bone but there is undermining thick surface again as usual. Our intake nurse noted what looked to be purulent drainage I suppose that could have been Bactroban but I did a culture for CandS 4/20; depth down 0.7 cm. Culture from last time was negative we have been using Bactroban and silver alginate 4/29; depth down 0.5 cm. Using silver alginate. He reports some drainage 5/6; comes in with the wound worse. Wound is deeper and tunnels. Measures 0.9 depth and an undermining area of 1.8 cm. 5/18; there is a change the dressing last week. Still using Sorbact hydrogel. What I can see of the base of the wound looks better. 6/1. Not much change. I changed him to endoform today. He has 1.1 cm in depth no palpable bone 6/8; again a small punched out wound with significant undermining. Culture I did last week was negative. T oday he had some purulent drainage perhaps mixed with some retained endoform. 6/22; MRI report below IMPRESSION: Skin ulceration on the plantar surface of the foot at the level of the head of the second metatarsal. Mild marrow edema in the head of the second metatarsal could be reactive or due to osteomyelitis. Thin fluid collection surrounding the head and neck of the second metatarsal appears to communicate with the patient's skin wound and is worrisome for a septic collection. Advanced first MTP osteoarthritis. Prior amputations as  noted above. Electronically Signed By: Inge Rise M.D. On: 06/16/2020 11:51 PCR culture I did last time I believe showed peptostreptococcus. There was no comment on any other gram-negative or gram-positive organisms. I gave him a course of Flagyl which I think he is finishing up. We are not making any progress with the wound over the right second/third metatarsal head. We are using silver alginate putting him in kerlix Coban 6/29; has his appointment with Dr. [name] of orthopedics next Tuesday I think we see him on the same day. He was in for a nurse visit today. Nurse noted undermining of skin and callus so I was asked to look at this. We have been using silver alginate and kerlix Coban 7/6; patient saw Dr. [name] PA today will see Dr. [name] on Tuesday. The major question is does he needs surgery to clean out the area in question culture question resect any involved bone etc. Otherwise he will need to see infectious disease for broad-spectrum antibiotics given at dialysis 7/13; I talked to Dr. [name] late last week. He was not impressed with the MRI results thinking that the bone changes may have been reactive rather than osteomyelitis.  He did not think he needed drainage of an abscess thinking of the fluid we are seeing was superficial and contiguous with the actual wound. He did not think he needed surgery or resection. Recommended consideration of infectious disease and return him to our care. We have been using silver alginate 7/20; PCR culture I did last week showed staph aureus methicillin sensitive. I have him on twice daily Keflex. Silver alginate to the wound 7/27; I have continued his 500 every 12 of cephalexin with a second dose after dialysis. My intention is to give him 4 to 6 weeks of antibiotics directed at [place] Signature(s) Signed: 07/23/2020 3:52:22 AM By: Linton Ham MD Entered By: Linton Ham on 07/22/2020  15:24:08 -------------------------------------------------------------------------------- Physical Exam Details Patient Name: Date of Service: [name] 07/22/2020 2:30 PM Medical Record Number: [id number] Patient Account Number: [id number] Date of Birth/Sex: Treating RN: March 09, 1951 (68 y.o. [name] Primary Care Provider: Kristie [name] Other Clinician: Referring Provider: Treating Provider/Extender: [name] in Treatment: 58 Constitutional Sitting or standing Blood Pressure is within target range for patient.. Pulse regular and within target range for patient.Marland Kitchen Respirations regular, non-labored and within target range.. Temperature is normal and within the target range for the patient.Marland Kitchen Appears in no distress. Notes Wound exam; small punched out area. Not as deep. Still requiring debridement with a #3 curette removing callus skin and subcutaneous tissue from around the wound margins. This to me seems more shallow there is no palpable bone. Our intake nurse noted some drainage under the dressing although I see no evidence of infection here. Electronic Signature(s) Signed: 07/23/2020 3:52:22 AM By: Linton Ham MD Entered By: Linton Ham on 07/22/2020 15:25:20 -------------------------------------------------------------------------------- Physician Orders Details Patient Name: Date of Service: JAIR, LINDBLAD 07/22/2020 2:30 PM Medical Record Number: [id number] Patient Account Number: [id number] Date of Birth/Sex: Treating RN: 1951-05-12 (68 y.o. [name] Primary Care Provider: Kristie [name] Other Clinician: Referring Provider: Treating Provider/Extender: [name] in Treatment: 13 Verbal / Phone Orders: No Diagnosis Coding ICD-10 Coding Code Description E11.621 Type 2 diabetes mellitus with foot ulcer L97.512 Non-pressure chronic ulcer of other part of right foot with fat layer exposed I70.298  Other atherosclerosis of native arteries of extremities, other extremity L03.115 Cellulitis of right lower limb Follow-up Appointments Return Appointment in 1 week. Dressing Change Frequency Wound #41 Right Metatarsal head second Do not change entire dressing for one week. Skin Barriers/Peri-Wound Care Moisturizing lotion Wound Cleansing May shower with protection. - patient to use a cast protector. Primary Wound Dressing Wound #41 Right Metatarsal head second Calcium Alginate with Silver Secondary Dressing Wound #41 Right Metatarsal head second Dry Gauze Other: - felt to offload periwound. Edema Control Kerlix and Coban - Right Lower Extremity Avoid standing for long periods of time Elevate legs to the level of the heart or above for 30 minutes daily and/or when sitting, a frequency of: - throughout the day Additional Orders / Instructions Other: - minimize walking and standing on foot to aid in wound healing and callus buildup. Patient Medications llergies: penicillin A Notifications Medication Indication Start End 07/26/2020 cephalexin DOSE 1 - oral 500 mg tablet - 1 tablet oral, BID Electronic Signature(s) Signed: 07/23/2020 3:52:22 AM By: Linton Ham MD Signed: 07/23/2020 6:04:59 PM By: [name] Coria RN Entered By: [name] on 07/22/2020 15:01:23 Prescription 07/22/2020 -------------------------------------------------------------------------------- Adaline Sill MD Patient Name: Provider: [id number] [id number] Date of Birth: NPI#: Dillard [place] Sex: DEA #:  [phone] Phone #: License #: [place] Patient Address: [address] [place], [place] [address] [address], [place] [address] [phone] Allergies penicillin Medication Medication: Route: Strength: Form: cephalexin oral 500 mg tablet Class: CEPHALOSPORIN ANTIBIOTICS - 1ST  GENERATION Dose: Frequency / Time: Indication: 1 1 tablet oral, BID Number of Refills: Number of Units: 0 Twenty Eight (28) Block(s) Generic Substitution: Start Date: End Date: Administered at Facility: Substitution Permitted 06/19/7627 No Note to Pharmacy: Hand Signature: Date(s): Electronic Signature(s) Signed: 07/23/2020 3:52:22 AM By: Linton Ham MD Signed: 07/23/2020 6:04:59 PM By: [name] Coria RN Entered By: [name] on 07/22/2020 15:01:25 -------------------------------------------------------------------------------- Problem List Details Patient Name: Date of Service: [name]. 07/22/2020 2:30 PM Medical Record Number: [id number] Patient Account Number: [id number] Date of Birth/Sex: Treating RN: Mar 29, 1951 (68 y.o. Staci Acosta, Morey Hummingbird Primary Care Provider: Kristie [name] Other Clinician: Referring Provider: Treating Provider/Extender: [name] in Treatment: 58 Active Problems ICD-10 Encounter Code Description Active Date MDM Diagnosis E11.621 Type 2 diabetes mellitus with foot ulcer 08/02/2019 No Yes L97.512 Non-pressure chronic ulcer of other part of right foot with fat layer exposed 08/16/2019 No Yes I70.298 Other atherosclerosis of native arteries of extremities, other extremity 06/12/2019 No Yes L03.115 Cellulitis of right lower limb 11/15/2019 No Yes Inactive Problems ICD-10 Code Description Active Date Inactive Date L03.114 Cellulitis of left upper limb 06/12/2019 06/12/2019 L98.498 Non-pressure chronic ulcer of skin of other sites with other specified severity 06/12/2019 06/12/2019 S61.209D Unspecified open wound of unspecified finger without damage to nail, subsequent 06/12/2019 06/12/2019 encounter Resolved Problems Electronic Signature(s) Signed: 07/23/2020 3:52:22 AM By: Linton Ham MD Entered By: Linton Ham on 07/22/2020  15:20:34 -------------------------------------------------------------------------------- Progress Note Details Patient Name: Date of Service: [name] 07/22/2020 2:30 PM Medical Record Number: [id number] Patient Account Number: [id number] Date of Birth/Sex: Treating RN: 1951/01/27 (68 y.o. [name] Primary Care Provider: Kristie [name] Other Clinician: Referring Provider: Treating Provider/Extender: [name] in Treatment: 44 Subjective History of Present Illness (HPI) The following HPI elements were documented for the patient's wound: Location: Patient presents with a wound to bilateral feet. Quality: Patient reports experiencing essentially no pain. Severity: Mildly severe wound with no evidence of infection Duration: Patient has had the wound for greater than 2 weeks prior to presenting for treatment Stomatitis cyst the patient is a pleasant 69 yrs old bm here for evaluation of ulcers on the plantar aspect of both feet. 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 He has a left arm graft for access. He has been trying to stay off his feet for weeks but does not seem to have any improvement in the wound. He has been seeing someone at the foot center and was referred to the Wound Care center for further evaluation. 12/30/15 the patient has 3 wounds one over the right first metatarsal head and 2 on the left foot at the left fifth and lleft first metatarsal head. All of these look relatively similar. The one over the left fifth probe to bone I could not prove that any of the others did. I note his MRI in November that did not show osteomyelitis. His peripheral pulses seem robust. All of these underwent surgical debridement to remove callus nonviable skin and subcutaneous tissue 01/06/16; the patient had his sutures removed from the right fifth ray amputation. There  may be a small open part of this  superiorly but otherwise the incision looks good. Areas over his right first, left first and left fifth metatarsal head all underwent surgical debridement as a varying degree of callus, skin and nonviable subcutaneous tissue. The area that is most worrisome is the right fifth metatarsal head which has a wound probes precariously close to bone. There is no purulent drainage or erythema 01/13/16; I'm not exactly sure of the status of the right fifth ray amputation site however he follows with Dr. [name] later this week. The area over his right first plantar metatarsal head, left first and fifth plantar metatarsal head are all in the same status. Thick circumferential callus, nonviable subcutaneous tissue. Culture of the left fifth did not culture last week 01/20/16; all of the patient's wounds appear and roughly the same state although his amputation site on the right lateral foot looks better. His wounds over the right first, left first and left fifth metatarsal heads all underwent difficult surgical debridement removing circumferential callus nonviable skin and subcutaneous tissue. There is no overt evidence of infection in these areas. MRI at the end of December of the left foot did not show osteomyelitis, right foot showed osteomyelitis of the right fifth digit he is now status post amputation. 01/27/16 the patient's wounds over his plantar first and fifth metatarsal heads on the left all appear better having been started on a total contact cast last week. They were debridement of circumferential callus and nonviable subcutaneous tissue as was the wound over the first metatarsal head. His surgical incision on the right also had some light surface debridement done. 03/23/2016 -- the patient was doing really well and most of his wounds had almost completely healed but now he came back today with a history of having a discharge from the area of his right foot on the plantar aspect and also between his  first and second toe. He also has had some discharge from the left foot. Addendum: I spoke to the PA [name] at the dialysis center whose fax number is [phone]. We discussed the infection the patient has and she will put the patient on vancomycin and Fortaz until the final culture report is back. We will fax this as soon as available. 03/30/2016 -- left foot x-ray IMPRESSION:No definitive osteomyelitis noted. X-ray of the right foot -- IMPRESSION: 1. Soft tissue swelling. Prior amputation right fifth digit. No acute or focal bony abnormality identified. If osteomyelitis remains a clinical concern, MRI can be obtained. 2. Peripheral vascular disease. His culture reports have grown an MSSA -- and we will fax this report to his hemodialysis center. He was called in a prescription of oral doxycycline but I have told her not to fill this in as he is already on IV antibiotics. 04/06/2016 -- a few days ago, I spoke to the hemodialysis center nurse who had stopped the IV antibiotics and he was given a prescription for doxycycline 100 mg by mouth twice a day for a week and he is on this at the present time. 05/11/2016 -- he has recently seen his PCP this week and his hemoglobin A1c was 7. He is working on his paperwork to get his orthotic shoes. 05/25/2016 -- -- x-ray of the left foot IMPRESSION:No acute bony abnormality. No radiographic changes of acute osteomyelitis. No change since prior study. X-ray of the right foot -- IMPRESSION: Postsurgical changes are seen involving the fifth toe. No evidence of acute osteomyelitis. he developed a  large blister on the medial part of his right foot and this opened out and drain fluid. 06/01/2016 -- he has his MRI to be done this afternoon. 06/15/2016 -- MRI of the left forefoot without contrast shows 2 separate regions of cutaneous and subcutaneous edema and possible ulceration and blistering along the ball of the foot. No obvious osteomyelitis  identified. MRI of the right foot showed cutaneous and subcutaneous thickening plantar to the first digit sesamoid with an ulcer crater but no underlying osteomyelitis is identified. 06/22/2016 -- the right foot plantar ulcer has been draining a lot of seropurulent material for the last few days. 06/30/2016 -- spoke to the dialysis center and I believe I spoke to [place] a PA at the center who discussed with me and agreed to putting Legrand Como on vancomycin until his cultures arrive. On review of his culture report no WBCs were seen or no organisms were seen and the culture was reincubated for better growth. The final report is back and there were no predominant growth including Streptococcus or Staphylococcus. Clinically though he has a lot of drainage from both wounds a lot of undermining and there is further blebs on the left foot towards the interspace between his first and second toe. 08/10/2016 -- the culture from the right foot showed normal skin flora and there was no Staphylococcus aureus was group A streptococcus isolated. 09/21/2016 -- -- MRI of the right foot was done on 09/13/2016 - IMPRESSION: Findings most consistent with acute osteomyelitis throughout the great toe, sesamoid bones and plantar aspect of the head of the first metatarsal. Fluid in the sheath of the flexor tendon of the great toe could be sympathetic but is worrisome for septic tenosynovitis. First MTP joint effusion worrisome for septic joint. He was admitted to the hospital on 09/11/2016 and treated for a fever with vancomycin and cefepime. He was seen by Dr. Bobby Rumpf of infectious disease who recommended 6 weeks treatment with vancomycin and ceftazidime with his hemodialysis and to continue to see as in the wound clinic. Vascular consult was pending. The patient was discharged home on 09/14/2016 and he would continue with IV antibiotics for 6 weeks. 09/28/16 wound appears reasonably healthy. Continuing with total  contact cast 10/05/16 wound is smaller and looking healthy. Continue with total contact cast. He continues on IV antibiotics 10/12/2016 -- he has developed a new wound on the dorsal aspect of his left big toe and this is a superficial injury with no surrounding cellulitis. He has completed 30 days of IV antibiotics and is now ready to start his hyperbaric oxygen therapy as per his insurance company's recommendation. 10/19/2016 -- after the cast was removed on the right side he has got good resolution of his ulceration on the right plantar foot. he has a new wound on the left plantar foot in the region of his fourth metatarsal and this will need sharp debridement. 10/26/2016 -- Xray of the right foot complete: IMPRESSION: Changes consistent with osteomyelitis involving the head of the first metatarsal and base of the first proximal phalanx. The sesamoid bones are also likely involved given their positioning. 11/02/2016 -- there still awaiting insurance clearance for his hyperbaric oxygen therapy and hopefully he will begin treatment soon. 11/09/2016 -- started with hyperbaric oxygen therapy and had some barotrauma to the right ear and this was seen by ENT who was prescribed Afrin drops and would probably continue with HBO and he is scheduled for myringotomy tubes on Friday 11/16/2016 -- he had his myringotomy  tubes placed on Friday and has been doing much better after that with some fluid draining out after hyperbaric treatment today. The pain was much better. 11/30/2016 -- over the last 2 days he noticed a swelling and change of color of his right second toe and this had been draining minimal fluid. 12/07/2016 -- x-ray of the right foot -- IMPRESSION:1. Progressive ulceration at the distal aspect of the second digit with significant soft tissue swelling and osseous changes in the distal phalanx compatible with osteomyelitis. 2. Chronic osteomyelitis at the first MTP joint. 3. Ulcerations at the  second and third toes as well without definite osseous changes. Osteomyelitis is not excluded. 4. Fifth digit amputation. On 12/01/2016 oo I spoke to Dr. Bobby Rumpf, the infectious disease specialist, who kindly agreed to treat this with IV antibiotics and he would call in the order to the dialysis center and this has been discussed in detail with the patient who will make the appropriate arrangements. The patient will also book an appointment as soon as possible to see Dr. [name] in the office. Of note the patient has not been on antibiotics this entire week as the dialysis center did not receive any orders from Dr. [name]. I got in touch with Dr. [name] who tells me the patient has an appointment to see him this coming Wednesday. 12/14/2016 -- the patient is on ceftazidime and vancomycin during his dialysis, and I understand this was put on by his nephrologist Dr. Raliegh Ip possibly after speaking with infectious disease Dr. [name] 12/23/16; patient was on my schedule today for a wound evaluation as he had difficulties with his schedule earlier this week. I note that he is on vancomycin and ceftazidine at dialysis. In spite of this he arrives today with a new wound on the base of the right second toe this easily probes to bone. The known wound at the tip of the second toe With this area. He also has a superficial area on the medial aspect of the third toe on the side of the DIP. This does not appear to have much depth. The area on the plantar left foot is a deep area but did not probe the bone 12/28/16 -- he has brought in some lab work and the most recent labs done showed a hemoglobin of 12.1 hematocrit of 36.3, neutrophils of 51% WBC count of 5.6, BN of 53, albumin of 4.1 globulin of 3.7, vancomycin 13 g per mL. 01/04/2017 -- he saw Dr. [name] who has recommended a amputation of the right second toe and he is awaiting this date. He sees Dr. Bobby Rumpf of infectious disease tomorrow. The  bone culture taken on 12/28/2016 had no growth in 2 days. 01/11/2017 -- was seen by Dr. Bobby Rumpf regarding the management and has recommended a eval by vascular surgeons. He recommended to continue the antibiotics during his hemodialysis. Xray of the left foot -- IMPRESSION: No acute fracture, dislocation, or osseous erosion identified. 01/18/2017 -- he has his vascular workup later today and he is going to have his right foot second toe amputation this coming Friday by Dr. [name]. We have also put in for a another 30 treatments with hyperbaric oxygen therapy 01/25/2017 -- I have reviewed Dr. Donnetta Hutching is vascular report from last week where he reviewed him and thought his vascular function was good enough to heal his amputation site and no further tests were recommended. He also had his orthopedic related to surgery which is still pending the notes and we  will review these next week. 02/01/2017 -- the operative remote of Dr. 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 02/08/2017 --he was seen by Dr. [name] on 02/03/2017 -- patient is on Brinkley. after a thorough review he had recommended to continue with antibiotics during hemodialysis. The patient was seen by Dr. [name], but I do not find any follow-up note on the electronic medical record. he has removed the dressing over the right foot to remove the sutures and asked him to see him back in a month's time 02/22/2017 -- patient has been febrile and has been having symptoms of the upper respiratory tract infection but has not been checked for the flu and it's been over 4 days now. He  says he is feeling better today. Some drainage between his left first and second toe and this needed to be looked at 03/08/2017 -- was seen by Dr. Bobby Rumpf on 03/07/2017 after review he will stop his antibiotics and see him back in a month to see if he is feeling well. 03/15/2017 -- he has completed his course of hyperbaric oxygen therapy and is doing fine with his health otherwise. 03/22/2017 -- his nutritionist at the dialysis center has recommended a protein supplement to help build his collagen and we will prescribe this for him when he has the details 05/03/2017 -- he recently noticed the area on the plantar aspect of his fifth metatarsal head which opened out and had minimal drainage. 05/10/2017 -- -- x-ray of the left foot -- IMPRESSION:1. No convincing conventional radiographic evidence of active osteomyelitis.2. Active soft tissue ulceration at the tip of the fifth digit, and at the lateral aspect of the foot adjacent the base of the fifth metatarsal. 3. Surgical changes of prior fourth toe amputation. 4. 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 5. Small vessel atherosclerotic vascular calcifications. 6. 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 ======= Readmission after 5 weeks: 06/15/2017 -- the patient returns after 5 weeks having had an MRI done on 05/17/2017 MRI of the left foot without contrast showed soft tissue ulcer overlying the base of the fifth metatarsal. 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 No drainable fluid collection to suggest an abscess. He was in hospital between 05/16/2017 and 05/25/2017 -- and on discharge was asked to follow-up with Dr. [name] and Dr. [name]. He was treated 2 weeks post discharge with vancomycin plus ceftriaxone with hemodialysis and oral metronidazole 500 mg 3 times a day. The patient underwent a left fifth ray  amputation and excision on 5/23, but continued to have postoperative spikes of fever. last hemoglobin A1c was 6.7% He had a postoperative MR of the foot on 05/23/2017 -- which showed no new areas of cortical bone loss, edema or soft tissue ulceration to suggest osteomyelitis. the patient has completed his course of IV antibiotics during dialysis and is to see the infectious disease doctor tomorrow. Dr. [name] had seen him after suture removal and asked him to keep the wound with a dry dressing 06/21/2017 -- he was seen by Dr. Bobby Rumpf of infectious disease on 06/16/2017 --  he stopped his Flagyl and will see him back in 6 weeks. He was asked to follow-up with Dr. [name] and with the wound clinic clinic. 07/05/17; bone biopsy from last time showed acute osteomyelitis. This is from the reminiscent in the left fifth metatarsal. Culture result is apparently still pending [holding for anaerobe}. From my understanding in this case this is been a progressive necrotic wound which is deteriorated markedly over the last 3 weeks since he returned here. He now has a large area of exposed bone which was biopsied and cultured last week. Dr. Graylon Good has put him on vancomycin and Fortaz during his hemodialysis and Flagyl orally. He is to see Dr. [name] next week 07/19/2017 -- the patient was reviewed by Dr. [name] of orthopedics who reviewed the case in detail and agreed with the plan to continue with IV antibiotics, aggressive wound care and hyperbaric oxygen therapy. He would see him back in 3 weeks' time 08/09/2017 -- saw Dr. Bobby Rumpf on 08/03/2017 -he was restarted on his antibiotics for 6 weeks which included vanco, ceftaz and Flagyl. he recommended continuing his antibiotics for 6 weeks and reevaluate his completion date. He would continue with wound care and hyperbaric oxygen therapy. 08/16/2017 -- I understand he will be completing his 6 weeks of antibiotics sometime later this week. 09/19/2017 --  he has been without his wound VAC for the last week due to lack of supplies. He has been packing his wound with silver alginate. 10/04/2017 -- he has an appointment with Dr. [name] tomorrow and hence we will not apply the wound VAC after his dressing changes today. 10/11/2017 -- he was seen by Dr. Bobby Rumpf on 10/05/2017, noted that the patient was currently off antibiotics, and after thorough review he recommended he follow-up with the wound care as per plan and no further antibiotics at this point. 11/29/17; patient is arrived for the wound on his left lateral foot.Marland Kitchen He is completed IV antibiotics and 2 rounds of hyperbaric oxygen for treatment of underlying osteomyelitis. He arrives today with a surface on most of the wound area however our intake nurse noted drainage from the superior aspect. This was brought to my attention. 12/13/2017 -- the right plantar foot had a large bleb and once the bleb was opened out a large callus and subcutaneous debris was removed and he has a plantar ulcer near the fourth metatarsal head 12/28/17 on evaluation today patient appears to be doing acutely worse in regard to his left foot. The wound which has been appearing to do better as now open up more deeply there is bone palpable at the base of the wound unfortunately. He tells me that this feels "like it did when he had osteomyelitis previously" he also noted that his second toe on the left foot appears to be doing worse and is swollen there does appear to be some fluid collected underneath. His right foot plantar ulcer appears to be doing somewhat better at this point and there really is no complication at the site currently. No fevers, chills, nausea, or vomiting noted at this time. Patient states that he normally has no pain at this site however. T oday he is having significant pain. 01/03/18; culture done last week showed methicillin sensitive staph aureus and group B strep. He was on Septra however he  arrives today with a fever of 101. He has a functional dialysis shunt in his left arm but has had no pain here. No cough. He still makes some urine no  dysuria he did have abdominal pain nausea and diarrhea over the weekend but is not had any diarrhea since yesterday. Otherwise he has no specific complaints 01/05/18; the patient returns today in follow-up for his presentation from 1/8. At that point he was febrile. I gave him some Levaquin adjusted for his dialysis status. He tells me the fever broke that night and it is not likely that this was actually a wound infection. He is gone on to have an MRI of the left foot; this showed interval development of an abnormal signal from the base of the third and fourth metatarsal and in the cuboid reminiscent consistent with osteomyelitis. There is no mention of the left second toe I think which was a concern when it was ordered. The patient is taking his Levaquin 01/10/18; the patient has completed his antibiotics today. The area on the left lateral foot is smaller but it still probes easily to bone. He has underlying osteomyelitis here and sees Dr. Megan Salon of infectious disease this week ooThe area on the right third plantar metatarsal head still requires debridement not much change in dimensions [facility] has a new wound on the medial tip of his right third toe again this probes to bone. He only noticed this 2 days ago 01/17/18; the patient saw Dr. [name] last week and he is back on Vanco and Fortaz at dialysis. 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 We are previously seeing him for a wound also on the base of the fourth med head on the right. X-ray of this area did not show osteomyelitis in relation to a new wound on the tip of his right third toe. Again this probes to bone. Finally his second toe on the left which didn't really show anything on  the MRI at least no report appears to have a separated cutaneous area which I think is going to come right off the tip of his toe and leave exposed bone. Whether this is infectious or ischemic I am not clear Culture I did from the third toe last week which was his new wound was negative. Plain x-ray on the right foot did not show osteomyelitis in the toes 01/24/18; the patient is going went to see Dr. [name]. He is going to have a amputation of the left second and right third toe although paradoxically the left second toe looks better than last week. We have treating a deep probing wound on the left lateral foot and the area over the third metatarsal head on the right foot. 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 Also a debridement including bone of the left lateral foot and a closure which is still sutured. This is a bit surprising. Also apparent debridement of the base of the right third toe wound. 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 I'm not sure exactly where we are in the frame of things here. The wound on the left lateral foot is still sutured also the amputation sites of the left second and right third toe. He is been using Xeroform to the sutured areas silver alginate to the right foot 02/14/18; he continues on Rocephin  at dialysis for osteomyelitis. I still don't have a good sense of where we are in the treatment duration. The wounds on the left lateral foot had the sutures removed and this clearly still probes deeply. We debrided the area with a #5 curet. We'll use silver alginate to the wound oothe area over the right third met head also required debridement of callus skin and subcutaneous tissue and necrotic debris over the wound surface. This tunnels  superiorly but I did not unroofed this today. ooThe areas on the tip of his toes have a crusted surface eschar I did not debridement this either. 02/21/18; he continues on Rocephin at dialysis for osteomyelitis. I don't have a good sense of where we are and the treatment duration. The wounds on the left lateral foot is callused over on presentation but requires debridement. The area on the right third med head plantar aspect actually is measuring smaller 02/28/18;patient is on Vanco and Fortaz at dialysis, I'm not sure why I thought this was Rocephin above unless it is been changed. I don't have a good sense of time frame here. Using silver collagen to the area on the left lateral foot and right third med head plantar foot 03/07/18; patient is completing bank and Fortaz at dialysis soon. He is using Silver collagen to the area on the left lateral foot and the right third metatarsal head. He has a smaller superficial wound distal to the left lateral foot wound. 03/14/18-he is here in follow-up evaluation for multiple ulcerations to his bilateral feet. He presents with a new ulceration to the plantar aspect of the left foot underneath the blister, this was deroofed to reveal a partial thickness ulcer. He is voicing no complaints or concerns, tolerated dialysis yesterday. We will continue with same treatment plan and he will follow-up next week 03/21/18; the patient has a area on the lateral left foot which still has a small probing area. The overall surface area of the wound is better. He presented with a new ulceration on the left foot plantar fifth metatarsal head last week. The area on the right third metatarsal head appears smaller.using silver alginate to all wound areas. The patient is changing the dressing himself. He is using a Darco forefoot offloading are on the right and a healing sandal on the left 03/28/18; original wound left lateral foot. Still nowhere close to looking like a heeling  surface oosecondary wound on the left lateral foot at the base of his question fifth metatarsal which was new last week ooRight third metatarsal head. ooWe have been using silver alginate all wounds 04/04/18; the original wound on the left lateral foot again heavy callus surrounding thick nonviable subcutaneous tissue all requiring debridement. The new wound from 2 weeks ago just near this at the base of the fifth metatarsal head still looks about the same ooUnfortunately there is been deterioration in the third medical head wound on the right which now probes to bone. I must say this was a superficial wound at one point in time and I really don't have a good frame of reference year. I'll have to go back to look through records about what we know about the right foot. 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 He is also been to see Dr. [name]. Somewhere in here as somebody is ordered a VASCULAR evaluation which was done on 03/22/18. On the right is anterior tibial artery was  monophasic triphasic at the posterior tibial artery. On the left anterior tibial artery monophasic posterior tibial artery monophasic. ABI in the right was 1.43 on the left 1.21. TBIs on the right at 0.41 and on the left at 0.32. A vascular consult was recommended and I think has been arranged. 04/11/18; Mr. Efferson has not had an MRI of the right foot since 2017. Recent x-ray of the right foot done in January and February was negative. However he has had a major deterioration in the wound over the third met head. He is completed antibiotics last week at dialysis [place]. I think he is going to see vascular in follow-up. The areas on the left lateral foot and left plantar fifth metatarsal head both look satisfactory. I debrided both of these areas although the tissue here looks good. The area on the left lateral foot once  probe to bone it certainly does not do that now 04/18/18; MRI of the right foot is on Thursday. He has what looks to be serosanguineous purulent drainage coming out of the wound over the right third metatarsal head today. He completed antibiotics bank and Fortaz 2 weeks ago at dialysis. He has a vascular follow-up with regards to his arterial insufficiency although I don't exactly see when that is booked. The areas on the left foot including the lateral left foot and the plantar left fifth metatarsal head look about the same. 04/25/18-He is here in follow-up evaluation for bilateral foot ulcers. MRI obtained was negative for osteomyelitis. Wound culture was negative. We will continue with same treatment plan he'll follow-up next week 05/02/18; the right plantar foot wound over the third metatarsal head actually looks better than when I last saw this. His MRI was negative for osteomyelitis wound culture was negative. ooOn the left plantar foot both wounds on the plantar fifth metatarsal head and on the lateral foot both are covered in a very hard circumferential callus. 05/09/18; right plantar foot wound over the third metatarsal head stable from last week. ooLeft plantar foot wound over the fifth metatarsal head also stable but with callus around both wound areas ooThe area on the left lateral foot had thick callus over a surface and I had some thoughts about leaving this intact however it felt boggy. ooWe've been using silver alginate all wounds 05/16/18; since the patient was last here he was hospitalized from 5/15 through 5/19. 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 This grew MRSA. He was placed on vancomycin.MRI of the left foot suggested "progressive" osteomyelitis and bone destruction of the cuboid and the base of the fifth metatarsal. Stable erosive changes of the base of the  second metatarsal. I'm wondering if they're aware that he had surgery and debridement in the area of the underlying bone previously by Dr. [name]. In any case, He was also revascularized with an angioplasty and stenting of the left tibial peroneal trunk and angioplasty of the left posterior tibial artery. He is on vancomycin at dialysis. He has a 2 week follow-up with infectious disease. He has vascular surgery follow-up. He was started on Plavix. 05/23/18; the patient's wound on the lateral left foot at the level of the fifth metatarsal head and the plantar wound on the plantar fifth metatarsal head both look better. The area on the right third metatarsal head still has depth and undermining. We've been using silver alginate. He is  on vancomycin. He has been revascularized on the left 05/30/18; he continues on vancomycin at dialysis. Revascularized on the left. The area on the left lateral foot is just about closed. Unfortunately the area over the plantar fifth metatarsal head undermining superiorly as does the area over the right third metatarsal head. Both of these significantly deteriorated from last week. He has appointments next week with infectious disease and orthopedics I delayed putting on a cast on the left until those appointments which therefore we made we'll bring him in on Friday the 14th with the idea of a cast on the left foot 06/06/18; he continues on vancomycin at dialysis. Revascularized on the left. He arrives today with a ballotable swelling just above the area on the left lateral foot where his previous wound was. He has no pain but he is reasonably insensate. The difficult areas on the plantar left fifth metatarsal head looks stable whereas the area on the right third plantar metatarsal head about the same as last week there is undermining here although I did not unroofed this today. He required a considerable debridement of the swelling on the left lateral foot area and I unroofed  an abscess with a copious amount of brown purulent material which I obtained for culture. I don't believe during his recent hospitalization he had any further imaging although I need to review this. Previous cultures from this area done in this clinic showed MRSA, I would be surprised if this is not what this is currently even though he is on vancomycin. 06/13/18; he continues on vancomycin at dialysis however he finishes this on Sunday and then graduates the doxycycline previously prescribed by Dr. [name]. The abscess site on the lateral foot that I unroofed last time grew moderate amounts of methicillin-resistant staph aureus that is both vancomycin and tetracycline sensitive so we should be okay from that regard. He arrives today in clinic with a connection between the abscess site and the area on the lateral foot. We've been using silver alginate to all his wound areas. The right third metatarsal head wound is measuring smaller. Using silver alginate on both wound areas 06/20/18; transitioning to doxycycline prescribed by Dr. [name]. The abscess site on the lateral foot that I unroofed 2 weeks ago grew MRSA. That area has largely closed down although the lateral part of his wound on the fifth metatarsal head still probes to bone. I suspect all of this was connected. ooOn the right third metatarsal head smaller looking wound but surrounded by nonviable tissue that once again requires debridement using silver alginate on both areas 06/27/18; on doxycycline 100 twice a day. The abscess on the lateral foot has closed down. Although he still has the wound on the left fifth metatarsal head that extends towards the lateral part of the foot. The most lateral part of this wound probes to bone ooRight third metatarsal head still a deep probing wound with undermining. Nevertheless I elected not to debridement this this week ooIf everything is copious static next week on the left I'm going to attempt a total  contact cast 07/04/18; he is tolerating his doxycycline. The abscess on the left lateral foot is closed down although there is still a deep wound here that probes to bone. We will use silver collagen under the total contact cast Silver collagen to the deep wound over the right third metatarsal head is well 07/11/18; he is tolerating doxycycline. The left lateral fifth plantar metatarsal head still probes deeply but I could not  probe any bone. We've been using silver collagen and this will be the second week under the total contact cast ooSilver collagen to deep wound over the right third metatarsal head as well 07/18/18; he is still taking doxycycline. The left lateral fifth plantar metatarsal this week probes deeply with a large amount of exposed bone. Quite a deterioration. He required extensive debridement. Specimens of bone for pathology and CNS obtained ooAlso considerable debridement on the right third metatarsal head 07/25/18; bone for pathology last week from the lateral left foot showed osteomyelitis. CandS of the bone Enterobacter. And Klebsiella. He is now on ciprofloxacin and the doxycycline is stopped [Dr. [name] of infectious disease] area He has an appointment with Dr. [name] tomorrow I'm going to leave the total contact cast off for this week. May wish to try to reapply that next week or the week after depending on the wound bed looks. I'm not sure if there is an operative option here, previously is followed with Dr. [name] 08/01/18 on evaluation today patient presents for reevaluation. He has been seen by infectious disease, Dr. [name], and he has placed the patient back on Ceftaz currently to be given to him at dialysis three times a week. 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 Fortunately there does not appear to be the evidence of infection at this point in time. Overall the patient has been tolerating  the dressing changes without complication. We are no longer due lives in the cast of left foot secondary to the infection obviously. 08/10/18 on evaluation today patient appears to be doing rather well all things considered in regard to his left plantar foot ulcer. He does have a significant callous on the lateral portion of his left foot he wonders if I can help clean that away to some degree today. Fortunately he is having no evidence of infection at this time which is good news. No fevers chills noted. With that being said his right plantar foot actually does have a significant callous buildup around the wound opening this seems to not be doing as well as I would like at this point. I do believe that he would benefit from sharp debridement at this site. Subsequently I think a total contact cast would be helpful for the right foot as well. 08/17/18; the patient arrived today with a total contact cast on the right foot. This actually looks quite good. He had gone to see Dr. Megan Salon of infectious disease about the osteomyelitis on the left foot. I think he is on IV Fortaz at dialysis although I am not exactly sure of the rationale for the Fortaz at the time of this dictation. He also arrived in today with a swelling on the lateral left foot this is the site of his original wounds in fact when I first saw this man when he was under Dr. Ardeen Garland care I think this was the site of where the wound was located. It was not particularly tender however using a small scalpel I opened it to [place] some moderate amount of purulent drainage. 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 Last MRI was in May that showed progressive osteomyelitis and bone destruction of the cuboid and base of the fifth metatarsal. Last week he had an abscess over this same area this was removed.  Culture was  negative. ooWe are continuing with a total contact cast to the area on the third metatarsal head on the right and making some good progress here. The patient asked me again about renal transplant. He is not on the list because of the open wounds. 08/31/18; apparently the patient had an interruption in the [place] but he is now back on this at dialysis. Apparently there was a misunderstanding and Dr. Crissie Figures orders. Due to have the MRI of the left foot tonight. ooThe area on the right foot continues to have callus thick skin and subcutaneous tissue around the wound edge that requires constant debridement however the wound is smaller we are using a total contact cast in this area. Alginate all wounds 09/07/18; without much surprise the MRI of his left foot showed osteomyelitis in the reminiscent of his fourth metatarsal but also the third metatarsal. She arrives in clinic today with the right foot and a total contact cast. There was purulent drainage coming out of the wound which I have cultured. Marked deterioration here with undermining widely around the wound orifice. Using pickups and a scalpel I remove callus and subcutaneous tissue from a substantial new opening. ooIn a similar fashion the area on the left lateral foot that was blistered last week I have opened this and remove skin and subcutaneous tissue from this area to expose a obvious new wound. I think there is extension and communication between all of this on the left foot. ooThe patient is on Fortaz at dialysis. Culture done of the right foot. We are clearly not making progress here. ooWe have made him an appointment with Dr. [name] of orthopedics. I think an amputation of the left leg may be discussed. I don't think there is anything that can be done with foot salvage. 09/14/18; culture from the right foot last time showed a very resistant MRSA. This is resistant to doxycycline. I'm going to try to get linezolid at least for a week.  He does not have a appointment with Dr. [name] yet. 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 I'll send a message to Dr. [name] of infectious disease. I want the patient to see Dr. [name] to go over the pros and cons of an amputation which I think will be a BKA. The patient had a question about whether this is curative or not. I told him that I thought it would be although spread of staph aureus infection is not unheard of. MRI of the right foot was done at the end of April 2019 did not show osteomyelitis. The patient last saw Dr. [name] on 9/3. I'll send Dr. [name] a message. I would really like him to weigh the pros and cons of a BKA on the left otherwise he'll probably need IV antibiotics and perhaps hyperbaric oxygen again. He has not had a good response to this in the past. His MRI earlier this month showed progressive damage in the remnants of the fourth and third metatarsal 09/21/2018; sees Dr. [name] of orthopedics next week to discuss a left BKA in response to the osteomyelitis in the left foot. Using silver alginate to all wound areas. He is completing the Zyvox I did put in for him after last culture showed MRSA 09/29/2018; patient saw Dr. [name] of orthopedics discussed the osteomyelitis in the left foot third and fourth metatarsals. He did not recommend urgent surgery but certainly stated the only surgical option would  be a BKA. He is communicating with Dr. [name]. The problem here is the instability of the areas on his foot which constantly generate draining abscesses. I will communicate with Dr. [name] about this. He has an appointment on 10/23 10/05/2018; patient sees Dr. [name] on 10/23. I have sent him a secure message not ordered any additional antibiotics for now. Patient continues to have 2 open areas on the left plantar foot at the fifth metatarsal head and the lateral aspect of the foot. These have not changed all  that much. The area on the right third met head still has thick callus and surrounding subcutaneous tissue we have been using silver alginate 10/12/2018; patient sees Dr. [name] or colleague on 10/23. 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 The area on the right plantar foot is just about closed. The superior wound on the left fifth metatarsal head is callused over but I am not sure if this is closed. The area below it is about the same. Considerable amount of callus on the lateral foot. We have been using silver alginate to the wounds 10/19/2018; the patient saw Dr. [name] on 10/22. He wishes to try and continue to save the left foot. He has been given 6 weeks of oral ciprofloxacin. We continue to put a total contact cast on the right foot. The area over the fifth metatarsal head on the left is callused over/perhaps healed but I did not remove the callus to find out. He still has the wound on the left lateral midfoot requiring debridement. We have been using silver alginate to all wound areas 10/26/2018 ooOn the left foot our intake nurse noted some purulent drainage from the inferior wound [currently the only one that is not callused over] ooOn the right foot even though he is in total contact cast a considerable amount of thick black callus and surface eschar. On this side we have been using silver alginate under a total contact cast. oohe remains on ciprofloxacin as prescribed by Dr. [name] 11/02/2018; culture from last week which is done of the probing area on the left midfoot wound showed MRSA "few". I am going to need to contact Dr. [name] which I will do today I think he is going to need IV antibiotics again. The area on the left foot which was callused on the side is clearly separating today all of this was removed a copious amounts of callus and necrotic subcutaneous tissue. ooThe area on the right plantar foot  actually remained healthy looking with a healthy granulated base. ooWe are using silver alginate to all wound areas 11/09/2018; unfortunately neither 1 of the patient's wounds areas looks at all satisfactory. On the left he has considerable necrotic debris from the plantar wound laterally over the foot. I removed copious amounts of material including callus skin and subcutaneous tissue. On the right foot he arrives with undermining and frankly purulent drainage. Specimen obtained for culture and debridement of the callused skin and subcutaneous tissue from around the circumference. Because of this I cannot put him back in a total contact cast but to be truthful we are really unfortunately not making a lot of progress I did put in a secure text message to Dr. [name] wondering about the MRSA on the left foot. He suggested vancomycin although this is not started. I was left wondering if he expected me to send this into dialysis. Has we have more purulence on the right  side wait for that result before calling dialysis 11/16/2018; I called dialysis earlier this week to get the vancomycin started. I think he got the first dose on Tuesday. The patient tells me that he fell earlier this week twisting his left foot and ankle and he is swelling. He continues to not look well. He had blood cultures done at dialysis on Tuesday he is not been informed of the results. 12/07/2018; the patient was admitted to hospital from 11/22/2018 through 12/02/2018. He underwent a left BKA. Apparently had staph sepsis. In the meantime being off the right foot this is closed over. He follows up with Dr. 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 Subsequent to this he actually had multiple falls in fact he tells me to following which calls the wound to be his apparently. He has been seeing Dr. [name] and his physician  assistant in the interim. They actually did want him to come back for reevaluation to see if there's anything we can do for me wound care perspective the help this area to heal more appropriately. The patient states that he does not have a tremendous amount of pain which is good news. No fevers, chills, nausea, or vomiting noted at this time. We have gotten approval from Dr. [name] office had [employer] for [place] to treat the patient for his stop wound. This is due to the fact that the patient was in the 90 day postop global. 1/21; the patient does not have an open wound on the right foot although he does have some pressure areas that will need to be padded when he is transferring. The dehisced surgical wound looks clean although there is some undermining of 1.5 cm superiorly. 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 This appears healthy but there is undermining. TheraSkin #1 2/11; TheraSkin #2 2/25; TheraSkin #3. Wound is measuring smaller 3/10; TheraSkin #4 wound is measuring smaller 3/24; TheraSkin #5 wound is measuring smaller and looks healthy. 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 He has no open area on the right foot although he does have some callused area over the original third metatarsal head wound. The third metatarsal is also subluxed on the right foot. I have warned him today that he cannot consider wearing a prosthesis for at least a month but he can go for measurements. He is going to need to keep the right foot padded is much as possible in his diabetic shoe indefinitely READMISSION 06/12/2019 Mr. Grisso is a type II diabetic on dialysis. He has been in this clinic multiple times with wounds on his bilateral lower extremities. 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  He also has a history of wounds on his right foot but tells me that everything is going well here. He actually came in here with his prosthesis walking with a cane. We were all really quite gratified to see this He states over the last several weeks he has had a painful area at the tip of the left third finger. His dialysis shunt in his is in the left upper arm and this particularly hurts during dialysis when his fingers get numb and there is a  lot of pain in the tip of his left third finger. I believe he is seen vascular surgery. He had a Doppler done to evaluate for a dialysis steal syndrome. He is going for a banding procedure 1 week from today towards the left AV fistula. I think if that is unsuccessful they will be looking at creating a new shunt. He had a course of doxycycline when he which he finished about a week ago. He has been using Bactroban to the finger 6/25; the patient had his banding procedure and things seem to be going better. He is not having pain in his hand at dialysis. The area on the tip of his finger seems about closed. He did however have a paronychia I the last time I saw him. I have been advising him to use topical Bactroban washing the finger. Culture I did last time showed a few staph epidermidis which I was willing to dismiss as superficial skin contaminant. He arrives today with the finger wound looking better however the paronychia a seems worse 7/9; 2-week follow-up. He does not have an open wound on the tip of his third left finger. I thought he had an initial ischemic wound on the tip of the finger as well as a paronychia a medially. All of this appears to be closed. 8/6-Patient returns after 3 weeks for follow-up and has a right second met head plantar callus with a significant area of maceration hiding an ulcer ABI repeated today on right - 1.26 8/13-Patient returns at 1 week, the right second met head plantar wound appears to be slightly better, it is surrounded  by the callus area we are using silver alginate 8/20; the patient came back to clinic 2 weeks ago with a wound on the right second metatarsal head. He apparently told me he developed callus in this area but also went away from his custom shoes to a pair of running shoes. Using silver alginate. 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 There might be much we can do to offload this although the patient tells me he has a wheelchair that he is using to try and stay off the wound is much as possible 8/27; right second metatarsal head. Still small punched out area with thick callus and subcutaneous tissue around the wound. We have been using silver collagen. This is always been an issue with this man's wound especially on this foot 9/3; right second met head. Still the same punched-out area with thick callus and subcutaneous tissue around the wound removing the circumference demonstrates repetitively undermining area. I have removed all the subcutaneous tissue associated with this. We have been using silver collagen 9/15; right second metatarsal head. Same punched-out thick callus and subcutaneous tissue around the wound area. Still requiring debridement we have been using silver collagen I changed back to silver alginate X-ray last time showed no evidence of osteomyelitis. Culture showed Klebsiella and he was started and Keflex apparently just 4 days ago 9/24; right second metatarsal head. He is completed the antibiotics I gave him for 10 days. Same punched-out thick callus and subcutaneous tissue around the area. Still requiring debridement. We have been using silver alginate. The area itself looks swollen and this could be just [place] that comes on this area with walking on a prosthesis on the left however I think he needs an MRI and I am going to order that today. There is not an option to offload this further 10/1; right second  plantar metatarsal head. MRI booked for October 6.  Generally looking better today using silver alginate 10/8; right second plantar metatarsal head. MRI that was done on 10/6 was negative for osteomyelitis noted prior amputations of the second and fourth toes at the [facility]. Prior amputation of the third toe to the base of the middle phalanx. Prior amputation of the fifth toe to the head of the metatarsal. 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 He does not recall about hearing about this. He did not have any pain but then again he has reduced sensation from diabetic neuropathy 10/15; right second plantar metatarsal head. Still in the same condition callus nonviable subcutaneous tissue which cleans up nicely with debridement but reforms by the next week. The patient tells me that he is offloading this is much as he can including taking his wheelchair to dialysis. We have been using silver alginate, changed to silver collagen today 10/22; right second plantar metatarsal head. Wound measures smaller but still thick callus around this wound. Still requiring debridement 11/5 right second plantar metatarsal head. Less callus around the wound circumference but still requiring debridement. There is still some undermining which I cleaned out the wound looks healthy but still considerable punched out depth with a relatively small circumference to the wound there is no palpable bone no purulent drainage 11/12; right second plantar metatarsal head. Small wound with thick callus tissue around the wound and significant undermining. We have been using silver collagen after my continuous debridements of this area 11/19; patient arrives in today with purulent drainage coming out of the wound in the second third met head area on the right foot. Serosanguineous thick drainage. Specimen obtained for culture. 11/21/2019 on evaluation today patient appears to be doing well with regard to his foot ulcer. The good news is is  culture came back negative for any bacteria there was no growth. The other good news is his x-ray also appears to be doing well. The foot is also measuring much better than what it was. Overall I am very pleased with how things seem to be progressing. 12/22; patient was in [facility] for several weeks due to the death of a family member. He said he stayed off the wound using silver alginate. 01/03/2020. Patient has a small open area with roughly 0.3 cm of circumferential undermining. The orifice looks smaller. We have been using silver collagen. There is not a wound more aggressive way to offload this area as he has a prosthesis on the other leg 1/21; again the same small area is 2 weeks ago. We have been using silver collagen I change that to endoform today I really believe that this is an offloading issue 2/4; the area on the right third met head. We have been using endoform. 2/11 the area on the right third met head we have been using silver alginate. 2/25; the area on the right third met head. There is much less callus on this today. The patient states he is offloading this even more aggressively. As an example he is taking his wheelchair into dialysis on most days 3/4; right third metatarsal head. Again he has eschar over the surface of the wound which I removed with a #3 curette. Some subcutaneous debris. This has 0.8 cm of direct probing depth. I cannot feel any bone here. I did do a culture the area 3/11; right third metatarsal head. Again an eschared area over the wound which almost makes  this look superficial. Again debridement reveals a wound with probing depth probably not much different from last week there is no palpable bone no palpable purulence. The patient tells me that he is making every effort to offload this area properly. He is taking wheelchair into dialysis etc. There is no option for forefoot offloading or a total contact cast. We used Oasis #1 today 3/18; again callus  and eschar over the wound surface. I remove this but still a probing wound although only 3 mm in depth this time. This is an improvement 3/25; again callus and eschar over the wound surface which I removed the wound is much deeper today at 0.7 cm. There is no palpable bone. Because of the appearance of this a culture was done. I am not going to put the Oasis back in this. Concerned about underlying infection. Notable for the fact that he is just finished doxycycline for the last go round of this 3/30; still 0.7 cm in depth which is disappointing. Culture I gave last time showed a few Staph aureus which is methicillin susceptible. This should have been taken care of by the recent course of doxycycline I gave him. We are using silver alginate 4/6; almost 1 cm in depth. We have been using silver alginate with underlying Bactroban. 4/15; about 1 cm in depth still. There is no palpable bone but there is undermining thick surface again as usual. Our intake nurse noted what looked to be purulent drainage I suppose that could have been Bactroban but I did a culture for CandS 4/20; depth down 0.7 cm. Culture from last time was negative we have been using Bactroban and silver alginate 4/29; depth down 0.5 cm. Using silver alginate. He reports some drainage 5/6; comes in with the wound worse. Wound is deeper and tunnels. Measures 0.9 depth and an undermining area of 1.8 cm. 5/18; there is a change the dressing last week. Still using Sorbact hydrogel. What I can see of the base of the wound looks better. 6/1. Not much change. I changed him to endoform today. He has 1.1 cm in depth no palpable bone 6/8; again a small punched out wound with significant undermining. Culture I did last week was negative. T oday he had some purulent drainage perhaps mixed with some retained endoform. 6/22; MRI report below ooIMPRESSION: Skin ulceration on the plantar surface of the foot at the level of the head of the second  metatarsal. Mild marrow edema in the head of the second metatarsal could be reactive or due to osteomyelitis. Thin fluid collection surrounding the head and neck of the second metatarsal appears to communicate with the patient's skin wound and is worrisome for a septic collection. Advanced first MTP osteoarthritis. Prior amputations as noted above. Electronically Signed By: Inge Rise M.D. On: 06/16/2020 11:51 PCR culture I did last time I believe showed peptostreptococcus. There was no comment on any other gram-negative or gram-positive organisms. I gave him a course of Flagyl which I think he is finishing up. We are not making any progress with the wound over the right second/third metatarsal head. We are using silver alginate putting him in kerlix Coban 6/29; has his appointment with Dr. [name] of orthopedics next Tuesday I think we see him on the same day. He was in for a nurse visit today. Nurse noted undermining of skin and callus so I was asked to look at this. We have been using silver alginate and kerlix Coban 7/6; patient saw Dr. [name] PA today  will see Dr. [name] on Tuesday. The major question is does he needs surgery to clean out the area in question culture question resect any involved bone etc. Otherwise he will need to see infectious disease for broad-spectrum antibiotics given at dialysis 7/13; I talked to Dr. [name] late last week. He was not impressed with the MRI results thinking that the bone changes may have been reactive rather than osteomyelitis. He did not think he needed drainage of an abscess thinking of the fluid we are seeing was superficial and contiguous with the actual wound. He did not think he needed surgery or resection. Recommended consideration of infectious disease and return him to our care. We have been using silver alginate 7/20; PCR culture I did last week showed staph aureus methicillin sensitive. I have him on twice daily Keflex. Silver alginate to  the wound 7/27; I have continued his 500 every 12 of cephalexin with a second dose after dialysis. My intention is to give him 4 to 6 weeks of antibiotics directed at [facility] Objective Constitutional Sitting or standing Blood Pressure is within target range for patient.. Pulse regular and within target range for patient.Marland Kitchen Respirations regular, non-labored and within target range.. Temperature is normal and within the target range for the patient.Marland Kitchen Appears in no distress. Vitals Time Taken: 2:32 PM, Height: 73 in, Weight: 202 lbs, BMI: 26.6, Temperature: 98.3 F, Pulse: 80 bpm, Respiratory Rate: 18 breaths/min, Blood Pressure: 136/78 mmHg, Capillary Blood Glucose: 112 mg/dl. General Notes: patient stated CBG this morning was 112 General Notes: Wound exam; small punched out area. Not as deep. Still requiring debridement with a #3 curette removing callus skin and subcutaneous tissue from around the wound margins. This to me seems more shallow there is no palpable bone. Our intake nurse noted some drainage under the dressing although I see no evidence of infection here. Integumentary (Hair, Skin) Wound #41 status is Open. Original cause of wound was Gradually Appeared. The wound is located on the Right Metatarsal head second. The wound measures 0.7cm length x 0.6cm width x 1cm depth; 0.33cm^2 area and 0.33cm^3 volume. There is Fat Layer (Subcutaneous Tissue) Exposed exposed. There is no tunneling or undermining noted. There is a small amount of serosanguineous drainage noted. The wound margin is thickened. There is large (67-100%) red, pink granulation within the wound bed. There is a small (1-33%) amount of necrotic tissue within the wound bed including Adherent Slough. 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 There was a Excisional Skin/Subcutaneous Tissue Debridement with a total area of 0.42 sq cm performed by Ricard Dillon., MD. With the following instrument(s): Curette to remove Viable and Non-Viable tissue/material. Material removed includes Subcutaneous Tissue, Slough, Skin: Dermis, and Skin: Epidermis after achieving pain control using Lidocaine 5% topical ointment. No specimens were taken. A time out was conducted at 14:47, prior to the start of the procedure. A Moderate amount of bleeding was controlled with Pressure. The procedure was tolerated well with a pain level of 0 throughout and a pain level of 0 following the procedure. Post Debridement Measurements: 0.7cm length x 0.6cm width x 1cm depth; 0.33cm^3 volume.  Character of Wound/Ulcer Post Debridement is improved. Severity of Tissue Post Debridement is: Fat layer exposed. Post procedure Diagnosis Wound #41: Same as Pre-Procedure Plan Follow-up Appointments: Return Appointment in 1 week. Dressing Change Frequency: Wound #41 Right Metatarsal head second: Do not change entire dressing for one week. Skin Barriers/Peri-Wound Care: Moisturizing lotion Wound Cleansing: May shower with protection. - patient to use a cast protector. Primary Wound Dressing: Wound #41 Right Metatarsal head second: Calcium Alginate with Silver Secondary Dressing: Wound #41 Right Metatarsal head second: Dry Gauze Other: - felt to offload periwound. Edema Control: Kerlix and Coban - Right Lower Extremity Avoid standing for long periods of time Elevate legs to the level of the heart or above for 30 minutes daily and/or when sitting, a frequency of: - throughout the day Additional Orders /  Instructions: Other: - minimize walking and standing on foot to aid in wound healing and callus buildup. The following medication(s) was prescribed: cephalexin oral 500 mg tablet 1 1 tablet oral, BID starting 07/26/2020 1. Still using silver alginate Curlex and Coban 2. Extended Keflex for another 2 weeks which should give him about 5 to 6 weeks Electronic Signature(s) Signed: 07/23/2020 3:52:22 AM By: Linton Ham MD Entered By: Linton Ham on 07/22/2020 15:26:25 -------------------------------------------------------------------------------- SuperBill Details Patient Name: Date of Service: [name] 07/22/2020 Medical Record Number: [id number] Patient Account Number: [id number] Date of Birth/Sex: Treating RN: 1951-09-24 (68 y.o. [name]) [name] Primary Care Provider: Kristie [name] Other Clinician: Referring Provider: Treating Provider/Extender: [name] in Treatment: 58 Diagnosis Coding ICD-10 Codes Code Description E11.621 Type 2 diabetes mellitus with foot ulcer L97.512 Non-pressure chronic ulcer of other part of right foot with fat layer exposed I70.298 Other atherosclerosis of native arteries of extremities, other extremity L03.115 Cellulitis of right lower limb Facility Procedures The patient participates with Medicare or their insurance follows the Medicare Facility Guidelines: CPT4 Code Description Modifier Quantity [id number] 11042 - DEB SUBQ TISSUE 20 SQ CM/< 1 ICD-10 Diagnosis Description L97.512 Non-pressure chronic ulcer of  other part of right foot with fat layer exposed Physician Procedures : CPT4 Code Description Modifier [id number] - WC PHYS SUBQ TISS 20 SQ CM ICD-10 Diagnosis Description L97.512 Non-pressure chronic ulcer of other part of right foot with fat layer exposed Quantity: 1 Electronic Signature(s) Signed: 07/23/2020 3:52:22 AM By: Linton Ham MD Entered By: Linton Ham on 07/22/2020 15:27:54

## 2020-07-25 NOTE — Progress Notes (Signed)
[name], [name] ([id number]) Visit Report for 07/22/2020 Arrival Information Details Patient Name: Date of Service: [name], [name] 07/22/2020 2:30 PM Medical Record Number: [id number] Patient Account Number: [id number] Date of Birth/Sex: Treating RN: [birth year] (69 y.o. [name] Primary Care Antonya Leeder: [name] Other Clinician: Referring Kristian Hazzard: Treating Tasheena Wambolt/Extender: Tanna Savoy in Treatment: 38 Visit Information History Since Last Visit Added or deleted any medications: No Patient Arrived: Wheel Chair Any new allergies or adverse reactions: No Arrival Time: 14:31 Had a fall or experienced change in No Accompanied By: self activities of daily living that may affect Transfer Assistance: None risk of falls: Patient Identification Verified: Yes Signs or symptoms of abuse/neglect since last visito No Secondary Verification Process Completed: Yes Hospitalized since last visit: No Patient Requires Transmission-Based Precautions: No Implantable device outside of the clinic excluding No Patient Has Alerts: No cellular tissue based products placed in the center since last visit: Has Dressing in Place as Prescribed: Yes Has Compression in Place as Prescribed: Yes Pain Present Now: No Electronic Signature(s) Signed: 07/25/2020 5:02:39 PM By: Kela Millin Entered By: Kela Millin on 07/22/2020 14:32:15 -------------------------------------------------------------------------------- Encounter Discharge Information Details Patient Name: Date of Service: [name] Plummer. 07/22/2020 2:30 PM Medical Record Number: [id number] Patient Account Number: [id number] Date of Birth/Sex: Treating RN: Aug 09, 1951 (69 y.o. [name] Primary Care Arnika Larzelere: [name] Other Clinician: Referring Frenchie Pribyl: Treating Phil Corti/Extender: Tanna Savoy in Treatment: 1 Encounter Discharge Information Items Post  Procedure Vitals Discharge Condition: Stable Temperature (F): 98.3 Ambulatory Status: Wheelchair Pulse (bpm): 80 Discharge Destination: Home Respiratory Rate (breaths/min): 18 Transportation: Other Blood Pressure (mmHg): 136/78 Accompanied By: self Schedule Follow-up Appointment: Yes Clinical Summary of Care: Patient Declined Electronic Signature(s) Signed: 07/25/2020 5:02:39 PM By: Kela Millin Entered By: Kela Millin on 07/22/2020 15:14:24 -------------------------------------------------------------------------------- Lower Extremity Assessment Details Patient Name: Date of Service: [name], [name] 07/22/2020 2:30 PM Medical Record Number: [id number] Patient Account Number: [id number] Date of Birth/Sex: Treating RN: 06/05/1951 (69 y.o. [name] Primary Care Aashritha Miedema: [name] Other Clinician: Referring Wally Behan: Treating Zara Wendt/Extender: Tanna Savoy in Treatment: 58 Edema Assessment Assessed: [name]: No] Patrice Paradise: No] Edema: [Left: N] [Right: o] Calf Left: Right: Point of Measurement: cm From Medial Instep cm 35 cm Ankle Left: Right: Point of Measurement: cm From Medial Instep cm 22 cm Vascular Assessment Pulses: Dorsalis Pedis Palpable: [Right:Yes] Electronic Signature(s) Signed: 07/25/2020 5:02:39 PM By: Kela Millin Entered By: Kela Millin on 07/22/2020 14:33:16 -------------------------------------------------------------------------------- Multi Wound Chart Details Patient Name: Date of Service: [name] Plummer. 07/22/2020 2:30 PM Medical Record Number: [id number] Patient Account Number: [id number] Date of Birth/Sex: Treating RN: 1951/10/01 (68 y.o. [name]) Carlene Coria Primary Care Jester Klingberg: [name] Other Clinician: Referring Reyna Lorenzi: Treating Mariangela Heldt/Extender: Tanna Savoy in Treatment: 66 Vital Signs Height(in): 73 Capillary Blood Glucose(mg/dl):  112 Weight(lbs): 202 Pulse(bpm): 15 Body Mass Index(BMI): 27 Blood Pressure(mmHg): 136/78 Temperature(F): 98.3 Respiratory Rate(breaths/min): 18 Photos: [41:No Photos Right Metatarsal head second] [N/A:N/A N/A] Wound Location: [41:Gradually Appeared] [N/A:N/A] Wounding Event: [41:Diabetic Wound/Ulcer of the Lower] [N/A:N/A] Primary Etiology: [41:Extremity Cataracts, Anemia, Arrhythmia,] [N/A:N/A] Comorbid History: [41:Congestive Heart Failure, Hypertension, Peripheral Arterial Disease, Type II Diabetes, End Stage Renal Disease, History of Burn, Osteoarthritis, Osteomyelitis, Neuropathy 07/23/2019] [N/A:N/A] Date Acquired: [41:50] [N/A:N/A] Weeks of Treatment: [41:Open] [N/A:N/A] Wound Status: [41:0.7x0.6x1] [N/A:N/A] Measurements L x W x D (cm) [41:0.33] [N/A:N/A] A (cm) : rea [41:0.33] [N/A:N/A] Volume (cm) : [41:81.30%] [N/A:N/A] % Reduction in A rea: [  41:37.70%] [N/A:N/A] % Reduction in Volume: [41:Grade 2] [N/A:N/A] Classification: [41:Small] [N/A:N/A] Exudate A mount: [41:Serosanguineous] [N/A:N/A] Exudate Type: [41:red, brown] [N/A:N/A] Exudate Color: [41:Thickened] [N/A:N/A] Wound Margin: [41:Large (67-100%)] [N/A:N/A] Granulation A mount: [41:Red, Pink] [N/A:N/A] Granulation Quality: [41:Small (1-33%)] [N/A:N/A] Necrotic A mount: [41:Fat Layer (Subcutaneous Tissue)] [N/A:N/A] Exposed Structures: [41:Exposed: Yes Fascia: No Tendon: No Muscle: No Joint: No Bone: No None] [N/A:N/A] Epithelialization: [41:Debridement - Excisional] [N/A:N/A] Debridement: Pre-procedure Verification/Time Out 14:47 [N/A:N/A] Taken: [41:Lidocaine 5% topical ointment] [N/A:N/A] Pain Control: [41:Subcutaneous, Slough] [N/A:N/A] Tissue Debrided: [41:Skin/Subcutaneous Tissue] [N/A:N/A] Level: [41:0.42] [N/A:N/A] Debridement A (sq cm): [41:rea Curette] [N/A:N/A] Instrument: [41:Moderate] [N/A:N/A] Bleeding: [41:Pressure] [N/A:N/A] Hemostasis A chieved: [41:0] [N/A:N/A] Procedural Pain: [41:0]  [N/A:N/A] Post Procedural Pain: [41:Procedure was tolerated well] [N/A:N/A] Debridement Treatment Response: [41:0.7x0.6x1] [N/A:N/A] Post Debridement Measurements L x W x D (cm) [41:0.33] [N/A:N/A] Post Debridement Volume: (cm) [41:Debridement] [N/A:N/A] Treatment Notes Wound #41 (Right Metatarsal head second) 1. Cleanse With Wound Cleanser Soap and water 2. Periwound Care Moisturizing lotion 3. Primary Dressing Applied Calcium Alginate Ag 4. Secondary Dressing Dry Gauze 6. Support Layer Applied Kerlix/Coban Notes felt, foam for extra cushion under coban per patient. cotton instead of kerlix Electronic Signature(s) Signed: 07/23/2020 3:52:22 AM By: Linton Ham MD Signed: 07/23/2020 6:04:59 PM By: Carlene Coria RN Entered By: Linton Ham on 07/22/2020 15:20:43 -------------------------------------------------------------------------------- Multi-Disciplinary Care Plan Details Patient Name: Date of Service: [name], [name] 07/22/2020 2:30 PM Medical Record Number: [id number] Patient Account Number: [id number] Date of Birth/Sex: Treating RN: 27-Feb-1951 (68 y.o. [name] Primary Care Anastasios Melander: [name] Other Clinician: Referring Obbie Lewallen: Treating Senta Kantor/Extender: Tanna Savoy in Treatment: 58 Active Inactive Wound/Skin Impairment Nursing Diagnoses: Knowledge deficit related to ulceration/compromised skin integrity Goals: Patient/caregiver will verbalize understanding of skin care regimen Date Initiated: 06/12/2019 Target Resolution Date: 08/22/2020 Goal Status: Active Ulcer/skin breakdown will have a volume reduction of 30% by week 4 Date Initiated: 06/12/2019 Date Inactivated: 07/05/2019 Target Resolution Date: 07/13/2019 Goal Status: Met Interventions: Assess patient/caregiver ability to obtain necessary supplies Assess patient/caregiver ability to perform ulcer/skin care regimen upon admission and as needed Assess  ulceration(s) every visit Notes: Electronic Signature(s) Signed: 07/23/2020 6:04:59 PM By: Carlene Coria RN Entered By: Carlene Coria on 07/22/2020 14:07:41 -------------------------------------------------------------------------------- Pain Assessment Details Patient Name: Date of Service: [name], [name] 07/22/2020 2:30 PM Medical Record Number: [id number] Patient Account Number: [id number] Date of Birth/Sex: Treating RN: [birth year] (69 y.o. [name] Primary Care Japneet Staggs: [name] Other Clinician: Referring Felice Deem: Treating Adela Esteban/Extender: Tanna Savoy in Treatment: 5 Active Problems Location of Pain Severity and Description of Pain Patient Has Paino No Site Locations Pain Management and Medication Current Pain Management: Electronic Signature(s) Signed: 07/25/2020 5:02:39 PM By: Kela Millin Entered By: Kela Millin on 07/22/2020 14:33:00 -------------------------------------------------------------------------------- Patient/Caregiver Education Details Patient Name: Date of Service: [name] Plummer 7/27/2021andnbsp2:30 PM Medical Record Number: [id number] Patient Account Number: [id number] Date of Birth/Gender: Treating RN: [birth year] (68 y.o. [name] Primary Care Physician: [name] Other Clinician: Referring Physician: Treating Physician/Extender: Tanna Savoy in Treatment: 15 Education Assessment Education Provided To: Patient Education Topics Provided Wound/Skin Impairment: Methods: Explain/Verbal Responses: State content correctly Electronic Signature(s) Signed: 07/23/2020 6:04:59 PM By: Carlene Coria RN Entered By: Carlene Coria on 07/22/2020 14:08:02 -------------------------------------------------------------------------------- Wound Assessment Details Patient Name: Date of Service: [name], [name] 07/22/2020 2:30 PM Medical Record Number:  [id number] Patient Account Number: [id number] Date of Birth/Sex: Treating RN: 01-17-1951 (69 y.o. [name] Primary Care  Latisa Belay: [name] Other Clinician: Referring Syliva Mee: Treating Klein Willcox/Extender: Tanna Savoy in Treatment: 58 Wound Status Wound Number: 41 Primary Diabetic Wound/Ulcer of the Lower Extremity Etiology: Wound Location: Right Metatarsal head second Wound Open Wounding Event: Gradually Appeared Status: Date Acquired: 07/23/2019 Comorbid Cataracts, Anemia, Arrhythmia, Congestive Heart Failure, Weeks Of Treatment: 50 History: Hypertension, Peripheral Arterial Disease, Type II Diabetes, End Clustered Wound: No Stage Renal Disease, History of Burn, Osteoarthritis, Osteomyelitis, Neuropathy Photos Photo Uploaded By: Mikeal Hawthorne on 07/23/2020 11:10:39 Wound Measurements Length: (cm) 0.7 Width: (cm) 0.6 Depth: (cm) 1 Area: (cm) 0. Volume: (cm) 0. % Reduction in Area: 81.3% % Reduction in Volume: 37.7% Epithelialization: None 33 Tunneling: No 33 Undermining: No Wound Description Classification: Grade 2 Wound Margin: Thickened Exudate Amount: Small Exudate Type: Serosanguineous Exudate Color: red, brown Foul Odor After Cleansing: No Slough/Fibrino Yes Wound Bed Granulation Amount: Large (67-100%) Exposed Structure Granulation Quality: Red, Pink Fascia Exposed: No Necrotic Amount: Small (1-33%) Fat Layer (Subcutaneous Tissue) Exposed: Yes Necrotic Quality: Adherent Slough Tendon Exposed: No Muscle Exposed: No Joint Exposed: No Bone Exposed: No Treatment Notes Wound #41 (Right Metatarsal head second) 1. Cleanse With Wound Cleanser Soap and water 2. Periwound Care Moisturizing lotion 3. Primary Dressing Applied Calcium Alginate Ag 4. Secondary Dressing Dry Gauze 6. Support Layer Applied Kerlix/Coban Notes felt, foam for extra cushion under coban per patient. cotton instead of kerlix Electronic  Signature(s) Signed: 07/25/2020 5:02:39 PM By: Kela Millin Entered By: Kela Millin on 07/22/2020 14:34:36 -------------------------------------------------------------------------------- Vitals Details Patient Name: Date of Service: [name] Plummer. 07/22/2020 2:30 PM Medical Record Number: [id number] Patient Account Number: [id number] Date of Birth/Sex: Treating RN: [birth year] (69 y.o. [name] Primary Care Markavious Micco: [name] Other Clinician: Referring Tarrance Januszewski: Treating Lilliona Blakeney/Extender: Tanna Savoy in Treatment: 58 Vital Signs Time Taken: 14:32 Temperature (F): 98.3 Height (in): 73 Pulse (bpm): 80 Weight (lbs): 202 Respiratory Rate (breaths/min): 18 Body Mass Index (BMI): 26.6 Blood Pressure (mmHg): 136/78 Capillary Blood Glucose (mg/dl): 112 Reference Range: 80 - 120 mg / dl Notes patient stated CBG this morning was 112 Electronic Signature(s) Signed: 07/25/2020 5:02:39 PM By: Kela Millin Entered By: Kela Millin on 07/22/2020 14:32:52

## 2020-07-31 ENCOUNTER — Encounter (HOSPITAL_BASED_OUTPATIENT_CLINIC_OR_DEPARTMENT_OTHER): Payer: Medicare Other | Attending: Physician Assistant | Admitting: Physician Assistant

## 2020-07-31 DIAGNOSIS — E11621 Type 2 diabetes mellitus with foot ulcer: Secondary | ICD-10-CM | POA: Diagnosis not present

## 2020-07-31 DIAGNOSIS — Z89421 Acquired absence of other right toe(s): Secondary | ICD-10-CM | POA: Diagnosis not present

## 2020-07-31 DIAGNOSIS — Z89431 Acquired absence of right foot: Secondary | ICD-10-CM | POA: Insufficient documentation

## 2020-07-31 DIAGNOSIS — S50312A Abrasion of left elbow, initial encounter: Secondary | ICD-10-CM | POA: Insufficient documentation

## 2020-07-31 DIAGNOSIS — I70298 Other atherosclerosis of native arteries of extremities, other extremity: Secondary | ICD-10-CM | POA: Insufficient documentation

## 2020-07-31 DIAGNOSIS — L03115 Cellulitis of right lower limb: Secondary | ICD-10-CM | POA: Diagnosis not present

## 2020-07-31 DIAGNOSIS — Z992 Dependence on renal dialysis: Secondary | ICD-10-CM | POA: Insufficient documentation

## 2020-07-31 DIAGNOSIS — L97512 Non-pressure chronic ulcer of other part of right foot with fat layer exposed: Secondary | ICD-10-CM | POA: Diagnosis not present

## 2020-07-31 DIAGNOSIS — N189 Chronic kidney disease, unspecified: Secondary | ICD-10-CM | POA: Insufficient documentation

## 2020-07-31 DIAGNOSIS — E1122 Type 2 diabetes mellitus with diabetic chronic kidney disease: Secondary | ICD-10-CM | POA: Diagnosis not present

## 2020-07-31 DIAGNOSIS — L84 Corns and callosities: Secondary | ICD-10-CM | POA: Insufficient documentation

## 2020-07-31 DIAGNOSIS — W19XXXA Unspecified fall, initial encounter: Secondary | ICD-10-CM | POA: Insufficient documentation

## 2020-07-31 NOTE — Progress Notes (Addendum)
[name], [name] ([id number]) Visit Report for 07/31/2020 Chief Complaint Document Details Patient Name: Date of Service: [name] 07/31/2020 2:30 PM Medical Record Number: [id number] Patient Account Number: [id number] Date of Birth/Sex: Treating RN: 1951-10-24 (69 y.o. 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 07/31/2020 2:30 PM Medical Record Number: [id number] Patient Account Number: [id number] Date of Birth/Sex: Treating RN: 02-Dec-1951 (69 y.o. [name] Primary Care Provider: Kristie [name] Other Clinician: Referring Provider: Treating Provider/Extender: [name] in Treatment: 59 Debridement Performed for Assessment: Wound #41 Right Metatarsal head second Performed By: Physician [name] Keeler, PA Debridement Type: Debridement Severity of Tissue Pre Debridement: Fat layer exposed Level of Consciousness (Pre-procedure): Awake and Alert Pre-procedure Verification/Time Out Yes - 15:40 Taken: Start Time: 15:40 T Area Debrided (L x W): otal 2 (cm) x 2 (cm) = 4 (cm) Tissue and other material debrided: Viable, Non-Viable, Callus, Slough, Subcutaneous, Slough Level: Skin/Subcutaneous Tissue Debridement Description: Excisional Instrument: Curette Bleeding: Minimum Hemostasis Achieved: Pressure End Time: 15:44 Procedural Pain: 0 Post Procedural Pain: 0 Response to Treatment: Procedure  was tolerated well Level of Consciousness (Post- Awake and Alert procedure): Post Debridement Measurements of Total Wound Length: (cm) 0.4 Width: (cm) 0.4 Depth: (cm) 0.8 Volume: (cm) 0.101 Character of Wound/Ulcer Post Debridement: Improved Severity of Tissue Post Debridement: Fat layer exposed Post Procedure Diagnosis Same as Pre-procedure Electronic Signature(s) Signed: 07/31/2020 3:51:36 PM By: [name] Keeler PA-C Signed: 07/31/2020 5:21:32 PM By: [name] Hurst RN, BSN Entered By: [name] on 07/31/2020 15:51:36 -------------------------------------------------------------------------------- HPI Details Patient Name: Date of Service: [name] Shines B. 07/31/2020 2:30 PM Medical Record Number: [id number] Patient Account Number: [id number] Date of Birth/Sex: Treating RN: 07-Aug-1951 (69 y.o. [name] Primary Care Provider: Kristie [name] Other Clinician: Referring Provider: Treating Provider/Extender: [name] in Treatment: 1 History of Present Illness Location: Patient presents with a wound to bilateral feet. Quality: Patient reports experiencing essentially no pain. Severity: Mildly severe wound with no evidence of infection Duration: Patient has had the wound for greater than 2 weeks prior to presenting for treatment HPI Description: Stomatitis cyst the patient is a pleasant 69 yrs old bm here for evaluation of ulcers on the plantar aspect of both feet. 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 He has a left arm graft for access. He has been trying to stay off his feet for weeks but does not seem to have any improvement in the wound. He has been seeing someone at the foot center and was referred to the Wound Care center for further evaluation. 12/30/15 the patient has 3 wounds one over the right first metatarsal head and 2 on the left foot at the left fifth and lleft first metatarsal  head. All of these look relatively similar. The one over the left fifth probe to bone I could not prove that any of the others did. I note his MRI in November that did not show osteomyelitis. His peripheral pulses seem robust. All of these  underwent surgical debridement to remove callus nonviable skin and subcutaneous tissue 01/06/16; the patient had his sutures removed from the right fifth ray amputation. There may be a small open part of this superiorly but otherwise the incision looks good. Areas over his right first, left first and left fifth metatarsal head all underwent surgical debridement as a varying degree of callus, skin and nonviable subcutaneous tissue. The area that is most worrisome is the right fifth metatarsal head which has a wound probes precariously close to bone. There is no purulent drainage or erythema 01/13/16; I'm not exactly sure of the status of the right fifth ray amputation site however he follows with [name] later this week. The area over his right first plantar metatarsal head, left first and fifth plantar metatarsal head are all in the same status. Thick circumferential callus, nonviable subcutaneous tissue. Culture of the left fifth did not culture last week 01/20/16; all of the patient's wounds appear and roughly the same state although his amputation site on the right lateral foot looks better. His wounds over the right first, left first and left fifth metatarsal heads all underwent difficult surgical debridement removing circumferential callus nonviable skin and subcutaneous tissue. There is no overt evidence of infection in these areas. MRI at the end of December of the left foot did not show osteomyelitis, right foot showed osteomyelitis of the right fifth digit he is now status post amputation. 01/27/16 the patient's wounds over his plantar first and fifth metatarsal heads on the left all appear better having been started on a total contact cast last week. They  were debridement of circumferential callus and nonviable subcutaneous tissue as was the wound over the first metatarsal head. His surgical incision on the right also had some light surface debridement done. 03/23/2016 -- the patient was doing really well and most of his wounds had almost completely healed but now he came back today with a history of having a discharge from the area of his right foot on the plantar aspect and also between his first and second toe. He also has had some discharge from the left foot. Addendum: I spoke to the PA [name] at the dialysis center whose fax number is [phone]. We discussed the infection the patient has and she will put the patient on vancomycin and Fortaz until the final culture report is back. We will fax this as soon as available. 03/30/2016 -- left foot x-ray IMPRESSION:No definitive osteomyelitis noted. X-ray of the right foot -- IMPRESSION: 1. Soft tissue swelling. Prior amputation right fifth digit. No acute or focal bony abnormality identified. If osteomyelitis remains a clinical concern, MRI can be obtained. 2. Peripheral vascular disease. His culture reports have grown an MSSA -- and we will fax this report to his hemodialysis center. He was called in a prescription of oral doxycycline but I have told her not to fill this in as he is already on IV antibiotics. 04/06/2016 -- a few days ago, I spoke to the hemodialysis center nurse who had stopped the IV antibiotics and he was given a prescription for doxycycline 100 mg by mouth twice a day for a week and he is on this at the present time. 05/11/2016 -- he has recently seen his PCP this week and his hemoglobin A1c was 7. He is working on his paperwork to get his orthotic shoes. 05/25/2016 -- -- x-ray of the left foot IMPRESSION:No acute bony abnormality. No radiographic changes of acute osteomyelitis. No change since prior  study. X-ray of the right foot -- IMPRESSION: Postsurgical changes  are seen involving the fifth toe. No evidence of acute osteomyelitis. he developed a large blister on the medial part of his right foot and this opened out and drain fluid. 06/01/2016 -- he has his MRI to be done this afternoon. 06/15/2016 -- MRI of the left forefoot without contrast shows 2 separate regions of cutaneous and subcutaneous edema and possible ulceration and blistering along the ball of the foot. No obvious osteomyelitis identified. MRI of the right foot showed cutaneous and subcutaneous thickening plantar to the first digit sesamoid with an ulcer crater but no underlying osteomyelitis is identified. 06/22/2016 -- the right foot plantar ulcer has been draining a lot of seropurulent material for the last few days. 06/30/2016 -- spoke to the dialysis center and I believe I spoke to [place] a PA at the center who discussed with me and agreed to putting Legrand Como on vancomycin until his cultures arrive. On review of his culture report no WBCs were seen or no organisms were seen and the culture was reincubated for better growth. The final report is back and there were no predominant growth including Streptococcus or Staphylococcus. Clinically though he has a lot of drainage from both wounds a lot of undermining and there is further blebs on the left foot towards the interspace between his first and second toe. 08/10/2016 -- the culture from the right foot showed normal skin flora and there was no Staphylococcus aureus was group A streptococcus isolated. 09/21/2016 -- -- MRI of the right foot was done on 09/13/2016 - IMPRESSION: Findings most consistent with acute osteomyelitis throughout the great toe, sesamoid bones and plantar aspect of the head of the first metatarsal. Fluid in the sheath of the flexor tendon of the great toe could be sympathetic but is worrisome for septic tenosynovitis. First MTP joint effusion worrisome for septic joint. He was admitted to the hospital on 09/11/2016 and  treated for a fever with vancomycin and cefepime. He was seen by Dr. Bobby Rumpf of infectious disease who recommended 6 weeks treatment with vancomycin and ceftazidime with his hemodialysis and to continue to see as in the wound clinic. Vascular consult was pending. The patient was discharged home on 09/14/2016 and he would continue with IV antibiotics for 6 weeks. 09/28/16 wound appears reasonably healthy. Continuing with total contact cast 10/05/16 wound is smaller and looking healthy. Continue with total contact cast. He continues on IV antibiotics 10/12/2016 -- he has developed a new wound on the dorsal aspect of his left big toe and this is a superficial injury with no surrounding cellulitis. He has completed 30 days of IV antibiotics and is now ready to start his hyperbaric oxygen therapy as per his insurance company's recommendation. 10/19/2016 -- after the cast was removed on the right side he has got good resolution of his ulceration on the right plantar foot. he has a new wound on the left plantar foot in the region of his fourth metatarsal and this will need sharp debridement. 10/26/2016 -- Xray of the right foot complete: IMPRESSION: Changes consistent with osteomyelitis involving the head of the first metatarsal and base of the first proximal phalanx. The sesamoid bones are also likely involved given their positioning. 11/02/2016 -- there still awaiting insurance clearance for his hyperbaric oxygen therapy and hopefully he will begin treatment soon. 11/09/2016 -- started with hyperbaric oxygen therapy and had some barotrauma to the right ear and this was seen by ENT who was  prescribed Afrin drops and would probably continue with HBO and he is scheduled for myringotomy tubes on Friday 11/16/2016 -- he had his myringotomy tubes placed on Friday and has been doing much better after that with some fluid draining out after hyperbaric treatment today. The pain was much better. 11/30/2016  -- over the last 2 days he noticed a swelling and change of color of his right second toe and this had been draining minimal fluid. 12/07/2016 -- x-ray of the right foot -- IMPRESSION:1. Progressive ulceration at the distal aspect of the second digit with significant soft tissue swelling and osseous changes in the distal phalanx compatible with osteomyelitis. 2. Chronic osteomyelitis at the first MTP joint. 3. Ulcerations at the second and third toes as well without definite osseous changes. Osteomyelitis is not excluded. 4. Fifth digit amputation. On 12/01/2016 I spoke to Dr. Bobby Rumpf, the infectious disease specialist, who kindly agreed to treat this with IV antibiotics and he would call in the order to the dialysis center and this has been discussed in detail with the patient who will make the appropriate arrangements. The patient will also book an appointment as soon as possible to see Dr. [name] in the office. Of note the patient has not been on antibiotics this entire week as the dialysis center did not receive any orders from Dr. [name]. I got in touch with Dr. [name] who tells me the patient has an appointment to see him this coming Wednesday. 12/14/2016 -- the patient is on ceftazidime and vancomycin during his dialysis, and I understand this was put on by his nephrologist Dr. Raliegh Ip possibly after speaking with infectious disease Dr. [name] 12/23/16; patient was on my schedule today for a wound evaluation as he had difficulties with his schedule earlier this week. I note that he is on vancomycin and ceftazidine at dialysis. In spite of this he arrives today with a new wound on the base of the right second toe this easily probes to bone. The known wound at the tip of the second toe With this area. He also has a superficial area on the medial aspect of the third toe on the side of the DIP. This does not appear to have much depth. The area on the plantar left foot is a deep area but did  not probe the bone 12/28/16 -- he has brought in some lab work and the most recent labs done showed a hemoglobin of 12.1 hematocrit of 36.3, neutrophils of 51% WBC count of 5.6, BN of 53, albumin of 4.1 globulin of 3.7, vancomycin 13 g per mL. 01/04/2017 -- he saw [name] who has recommended a amputation of the right second toe and he is awaiting this date. He sees Dr. Bobby Rumpf of infectious disease tomorrow. The bone culture taken on 12/28/2016 had no growth in 2 days. 01/11/2017 -- was seen by Dr. Bobby Rumpf regarding the management and has recommended a eval by vascular surgeons. He recommended to continue the antibiotics during his hemodialysis. Xray of the left foot -- IMPRESSION: No acute fracture, dislocation, or osseous erosion identified. 01/18/2017 -- he has his vascular workup later today and he is going to have his right foot second toe amputation this coming Friday by [name]. We have also put in for a another 30 treatments with hyperbaric oxygen therapy 01/25/2017 -- I have reviewed Dr. Donnetta Hutching is vascular report from last week where he reviewed him and thought his vascular function was good enough to heal his amputation  site and no further tests were recommended. He also had his orthopedic related to surgery which is still pending the notes and we will review these next week. 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 02/08/2017 --he was seen by Dr. [name] on 02/03/2017 -- patient is on [place]. after a thorough review he had recommended to continue with antibiotics  during hemodialysis. The patient was seen by [name], but I do not find any follow-up note on the electronic medical record. he has removed the dressing over the right foot to remove the sutures and asked him to see him back in a month's time 02/22/2017 -- patient has been febrile and has been having symptoms of the upper respiratory tract infection but has not been checked for the flu and it's been over 4 days now. He says he is feeling better today. Some drainage between his left first and second toe and this needed to be looked at 03/08/2017 -- was seen by Dr. Bobby Rumpf on 03/07/2017 after review he will stop his antibiotics and see him back in a month to see if he is feeling well. 03/15/2017 -- he has completed his course of hyperbaric oxygen therapy and is doing fine with his health otherwise. 03/22/2017 -- his nutritionist at the dialysis center has recommended a protein supplement to help build his collagen and we will prescribe this for him when he has the details 05/03/2017 -- he recently noticed the area on the plantar aspect of his fifth metatarsal head which opened out and had minimal drainage. 05/10/2017 -- -- x-ray of the left foot -- IMPRESSION:1. No convincing conventional radiographic evidence of active osteomyelitis.2. Active soft tissue ulceration at the tip of the fifth digit, and at the lateral aspect of the foot adjacent the base of the fifth metatarsal. 3. Surgical changes of prior fourth toe amputation. 4. 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 5. Small vessel atherosclerotic vascular calcifications. 6. 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 ======= Readmission after 5 weeks: 06/15/2017 -- the patient returns after 5 weeks having had an MRI done on 05/17/2017 MRI of the left foot without contrast showed soft tissue ulcer overlying the base of the fifth metatarsal.  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 No drainable fluid collection to suggest an abscess. He was in hospital between 05/16/2017 and 05/25/2017 -- and on discharge was asked to follow-up with [name] and Dr. [name]. He was treated 2 weeks post discharge with vancomycin plus ceftriaxone with hemodialysis and oral metronidazole 500 mg 3 times a day. 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 last hemoglobin A1c was 6.7% He had a postoperative MR of the foot on 05/23/2017 -- which showed no new areas of cortical bone loss, edema or soft tissue ulceration to suggest osteomyelitis. the patient has completed his course of IV antibiotics during dialysis and is to see the infectious disease doctor tomorrow. [name] had seen him after suture removal and asked  him to keep the wound with a dry dressing 06/21/2017 -- he was seen by Dr. Bobby Rumpf of infectious disease on 06/16/2017 -- he stopped his Flagyl and will see him back in 6 weeks. He was asked to follow-up with [name] and with the wound clinic clinic. 07/05/17; bone biopsy from last time showed acute osteomyelitis. This is from the reminiscent in the left fifth metatarsal. Culture result is apparently still pending [holding for anaerobe}. From my understanding in this case this is been a progressive necrotic wound which is deteriorated markedly over the last 3 weeks since he returned here. He now has a large area of exposed bone which was biopsied and cultured last week. Dr. Graylon Good has put him on vancomycin and Fortaz during his hemodialysis and Flagyl orally. He is to see [name] next week 07/19/2017 -- the patient was reviewed by [name] of orthopedics who reviewed the case in detail and agreed with the plan to continue with IV antibiotics, aggressive wound care and hyperbaric oxygen therapy. He would see him back  in 3 weeks' time 08/09/2017 -- saw Dr. Bobby Rumpf on 08/03/2017 -he was restarted on his antibiotics for 6 weeks which included vanco, ceftaz and Flagyl. he recommended continuing his antibiotics for 6 weeks and reevaluate his completion date. He would continue with wound care and hyperbaric oxygen therapy. 08/16/2017 -- I understand he will be completing his 6 weeks of antibiotics sometime later this week. 09/19/2017 -- he has been without his wound VAC for the last week due to lack of supplies. He has been packing his wound with silver alginate. 10/04/2017 -- he has an appointment with Dr. [name] tomorrow and hence we will not apply the wound VAC after his dressing changes today. 10/11/2017 -- he was seen by Dr. Bobby Rumpf on 10/05/2017, noted that the patient was currently off antibiotics, and after thorough review he recommended he follow-up with the wound care as per plan and no further antibiotics at this point. 11/29/17; patient is arrived for the wound on his left lateral foot.Marland Kitchen He is completed IV antibiotics and 2 rounds of hyperbaric oxygen for treatment of underlying osteomyelitis. He arrives today with a surface on most of the wound area however our intake nurse noted drainage from the superior aspect. This was brought to my attention. 12/13/2017 -- the right plantar foot had a large bleb and once the bleb was opened out a large callus and subcutaneous debris was removed and he has a plantar ulcer near the fourth metatarsal head 12/28/17 on evaluation today patient appears to be doing acutely worse in regard to his left foot. The wound which has been appearing to do better as now open up more deeply there is bone palpable at the base of the wound unfortunately. He tells me that this feels "like it did when he had osteomyelitis previously" he also noted that his second toe on the left foot appears to be doing worse and is swollen there does appear to be some fluid collected  underneath. His right foot plantar ulcer appears to be doing somewhat better at this point and there really is no complication at the site currently. No fevers, chills, nausea, or vomiting noted at this time. Patient states that he normally has no pain at this site however. T oday he is having significant pain. 01/03/18; culture done last week showed methicillin sensitive staph aureus and group B strep. He was on [place] however he arrives today with a fever of 101.  He has a functional dialysis shunt in his left arm but has had no pain here. No cough. He still makes some urine no dysuria he did have abdominal pain nausea and diarrhea over the weekend but is not had any diarrhea since yesterday. Otherwise he has no specific complaints 01/05/18; the patient returns today in follow-up for his presentation from 1/8. At that point he was febrile. I gave him some Levaquin adjusted for his dialysis status. He tells me the fever broke that night and it is not likely that this was actually a wound infection. He is gone on to have an MRI of the left foot; this showed interval development of an abnormal signal from the base of the third and fourth metatarsal and in the cuboid reminiscent consistent with osteomyelitis. There is no mention of the left second toe I think which was a concern when it was ordered. The patient is taking his Levaquin 01/10/18; the patient has completed his antibiotics today. The area on the left lateral foot is smaller but it still probes easily to bone. He has underlying osteomyelitis here and sees Dr. Megan Salon of infectious disease this week The area on the right third plantar metatarsal head still requires debridement not much change in dimensions He has a new wound on the medial tip of his right third toe again this probes to bone. He only noticed this 2 days ago 01/17/18; the patient saw Dr. [name] last week and he is back on Vanco and Fortaz at dialysis. This is related to the  osteomyelitis in the base of his left lateral foot which I think is the bases of his third and fourth metatarsals and cuboid remnant. We are previously seeing him for a wound also on the base of the fourth med head on the right. X-ray of this area did not show osteomyelitis in relation to a new wound on the tip of his right third toe. Again this probes to bone. Finally his second toe on the left which didn't really show anything on the MRI at least no report appears to have a separated cutaneous area which I think is going to come right off the tip of his toe and leave exposed bone. Whether this is infectious or ischemic I am not clear Culture I did from the third toe last week which was his new wound was negative. Plain x-ray on the right foot did not show osteomyelitis in the toes 01/24/18; the patient is going went to see [name]. He is going to have a amputation of the left second and right third toe although paradoxically the left second toe looks better than last week. We have treating a deep probing wound on the left lateral foot and the area over the third metatarsal head on the right foot. 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 Also a debridement including bone of the left lateral foot and a closure which is still sutured. This is a bit surprising. Also apparent debridement of the base of the right third toe wound. 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 I'm not sure exactly where we are in the frame of things here. The wound on the left lateral foot is still sutured also the amputation sites of the left second  and right third toe. He is been using Xeroform to the sutured areas silver alginate to the right foot 02/14/18; he continues on Rocephin at dialysis  for osteomyelitis. I still don't have a good sense of where we are in the treatment duration. The wounds on the left lateral foot had the sutures removed and this clearly still probes deeply. We debrided the area with a #5 curet. We'll use silver alginate to the wound the area over the right third met head also required debridement of callus skin and subcutaneous tissue and necrotic debris over the wound surface. This tunnels superiorly but I did not unroofed this today. The areas on the tip of his toes have a crusted surface eschar I did not debridement this either. 02/21/18; he continues on Rocephin at dialysis for osteomyelitis. I don't have a good sense of where we are and the treatment duration. The wounds on the left lateral foot is callused over on presentation but requires debridement. The area on the right third med head plantar aspect actually is measuring smaller 02/28/18;patient is on Vanco and Fortaz at dialysis, I'm not sure why I thought this was Rocephin above unless it is been changed. I don't have a good sense of time frame here. Using silver collagen to the area on the left lateral foot and right third med head plantar foot 03/07/18; patient is completing bank and Fortaz at dialysis soon. He is using Silver collagen to the area on the left lateral foot and the right third metatarsal head. He has a smaller superficial wound distal to the left lateral foot wound. 03/14/18-he is here in follow-up evaluation for multiple ulcerations to his bilateral feet. He presents with a new ulceration to the plantar aspect of the left foot underneath the blister, this was deroofed to reveal a partial thickness ulcer. He is voicing no complaints or concerns, tolerated dialysis yesterday. We will continue with same treatment plan and he will follow-up next week 03/21/18; the patient has a area on the lateral left foot which still has a small probing area. The overall surface area of the wound is better. He  presented with a new ulceration on the left foot plantar fifth metatarsal head last week. The area on the right third metatarsal head appears smaller.using silver alginate to all wound areas. The patient is changing the dressing himself. He is using a Darco forefoot offloading are on the right and a healing sandal on the left 03/28/18; original wound left lateral foot. Still nowhere close to looking like a heeling surface secondary wound on the left lateral foot at the base of his question fifth metatarsal which was new last week Right third metatarsal head. We have been using silver alginate all wounds 04/04/18; the original wound on the left lateral foot again heavy callus surrounding thick nonviable subcutaneous tissue all requiring debridement. The new wound from 2 weeks ago just near this at the base of the fifth metatarsal head still looks about the same Unfortunately there is been deterioration in the third medical head wound on the right which now probes to bone. I must say this was a superficial wound at one point in time and I really don't have a good frame of reference year. I'll have to go back to look through records about what we know about the right foot. 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 He is also been to see Dr.  Graves. Somewhere in here as somebody is ordered a VASCULAR evaluation which was done on 03/22/18. On the right is anterior tibial artery was monophasic triphasic at the posterior tibial artery. On the left anterior tibial artery monophasic posterior tibial artery monophasic. ABI in the right was 1.43 on the left 1.21. TBIs on the right at 0.41 and on the left at 0.32. A vascular consult was recommended and I think has been arranged. 04/11/18; Mr. Maynes has not had an MRI of the right foot since 2017. Recent x-ray of the right foot done in January and February was negative.  However he has had a major deterioration in the wound over the third met head. He is completed antibiotics last week at dialysis [place]. I think he is going to see vascular in follow-up. The areas on the left lateral foot and left plantar fifth metatarsal head both look satisfactory. I debrided both of these areas although the tissue here looks good. The area on the left lateral foot once probe to bone it certainly does not do that now 04/18/18; MRI of the right foot is on Thursday. He has what looks to be serosanguineous purulent drainage coming out of the wound over the right third metatarsal head today. He completed antibiotics bank and Fortaz 2 weeks ago at dialysis. He has a vascular follow-up with regards to his arterial insufficiency although I don't exactly see when that is booked. The areas on the left foot including the lateral left foot and the plantar left fifth metatarsal head look about the same. 04/25/18-He is here in follow-up evaluation for bilateral foot ulcers. MRI obtained was negative for osteomyelitis. Wound culture was negative. We will continue with same treatment plan he'll follow-up next week 05/02/18; the right plantar foot wound over the third metatarsal head actually looks better than when I last saw this. His MRI was negative for osteomyelitis wound culture was negative. On the left plantar foot both wounds on the plantar fifth metatarsal head and on the lateral foot both are covered in a very hard circumferential callus. 05/09/18; right plantar foot wound over the third metatarsal head stable from last week. Left plantar foot wound over the fifth metatarsal head also stable but with callus around both wound areas The area on the left lateral foot had thick callus over a surface and I had some thoughts about leaving this intact however it felt boggy. We've been using silver alginate all wounds 05/16/18; since the patient was last here he was hospitalized from 5/15  through 5/19. 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 This grew MRSA. He was placed on vancomycin.MRI of the left foot suggested "progressive" osteomyelitis and bone destruction of the cuboid and the base of the fifth metatarsal. Stable erosive changes of the base of the second metatarsal. I'm wondering if they're aware that he had surgery and debridement in the area of the underlying bone previously by [name]. In any case, He was also revascularized with an angioplasty and stenting of the left tibial peroneal trunk and angioplasty of the left posterior tibial artery. He is on vancomycin at dialysis. He has a 2 week follow-up with infectious disease. He has vascular surgery follow-up. He was started on Plavix. 05/23/18; the patient's wound on the lateral left foot at the level of the fifth metatarsal head and the plantar wound on the plantar fifth metatarsal  head both look better. The area on the right third metatarsal head still has depth and undermining. We've been using silver alginate. He is on vancomycin. He has been revascularized on the left 05/30/18; he continues on vancomycin at dialysis. Revascularized on the left. The area on the left lateral foot is just about closed. Unfortunately the area over the plantar fifth metatarsal head undermining superiorly as does the area over the right third metatarsal head. Both of these significantly deteriorated from last week. He has appointments next week with infectious disease and orthopedics I delayed putting on a cast on the left until those appointments which therefore we made we'll bring him in on Friday the 14th with the idea of a cast on the left foot 06/06/18; he continues on vancomycin at dialysis. Revascularized on the left. He arrives today with a ballotable swelling just above the area on the left lateral foot where his previous wound  was. He has no pain but he is reasonably insensate. The difficult areas on the plantar left fifth metatarsal head looks stable whereas the area on the right third plantar metatarsal head about the same as last week there is undermining here although I did not unroofed this today. He required a considerable debridement of the swelling on the left lateral foot area and I unroofed an abscess with a copious amount of brown purulent material which I obtained for culture. I don't believe during his recent hospitalization he had any further imaging although I need to review this. Previous cultures from this area done in this clinic showed MRSA, I would be surprised if this is not what this is currently even though he is on vancomycin. 06/13/18; he continues on vancomycin at dialysis however he finishes this on Sunday and then graduates the doxycycline previously prescribed by Dr. [name]. The abscess site on the lateral foot that I unroofed last time grew moderate amounts of methicillin-resistant staph aureus that is both vancomycin and tetracycline sensitive so we should be okay from that regard. He arrives today in clinic with a connection between the abscess site and the area on the lateral foot. We've been using silver alginate to all his wound areas. The right third metatarsal head wound is measuring smaller. Using silver alginate on both wound areas 06/20/18; transitioning to doxycycline prescribed by Dr. [name]. The abscess site on the lateral foot that I unroofed 2 weeks ago grew MRSA. That area has largely closed down although the lateral part of his wound on the fifth metatarsal head still probes to bone. I suspect all of this was connected. On the right third metatarsal head smaller looking wound but surrounded by nonviable tissue that once again requires debridement using silver alginate on both areas 06/27/18; on doxycycline 100 twice a day. The abscess on the lateral foot has closed down. Although  he still has the wound on the left fifth metatarsal head that extends towards the lateral part of the foot. The most lateral part of this wound probes to bone Right third metatarsal head still a deep probing wound with undermining. Nevertheless I elected not to debridement this this week If everything is copious static next week on the left I'm going to attempt a total contact cast 07/04/18; he is tolerating his doxycycline. The abscess on the left lateral foot is closed down although there is still a deep wound here that probes to bone. We will use silver collagen under the total contact cast Silver collagen to the deep wound over the right  third metatarsal head is well 07/11/18; he is tolerating doxycycline. The left lateral fifth plantar metatarsal head still probes deeply but I could not probe any bone. We've been using silver collagen and this will be the second week under the total contact cast Silver collagen to deep wound over the right third metatarsal head as well 07/18/18; he is still taking doxycycline. The left lateral fifth plantar metatarsal this week probes deeply with a large amount of exposed bone. Quite a deterioration. He required extensive debridement. Specimens of bone for pathology and CNS obtained Also considerable debridement on the right third metatarsal head 07/25/18; bone for pathology last week from the lateral left foot showed osteomyelitis. CandS of the bone Enterobacter. And Klebsiella. He is now on ciprofloxacin and the doxycycline is stopped [Dr. [name] of infectious disease] area He has an appointment with Dr. [name] tomorrow I'm going to leave the total contact cast off for this week. May wish to try to reapply that next week or the week after depending on the wound bed looks. I'm not sure if there is an operative option here, previously is followed with [name] 08/01/18 on evaluation today patient presents for reevaluation. He has been seen by infectious disease,  Dr. [name], and he has placed the patient back on Ceftaz currently to be given to him at dialysis three times a week. 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 Fortunately there does not appear to be the evidence of infection at this point in time. Overall the patient has been tolerating the dressing changes without complication. We are no longer due lives in the cast of left foot secondary to the infection obviously. 08/10/18 on evaluation today patient appears to be doing rather well all things considered in regard to his left plantar foot ulcer. He does have a significant callous on the lateral portion of his left foot he wonders if I can help clean that away to some degree today. Fortunately he is having no evidence of infection at this time which is good news. No fevers chills noted. With that being said his right plantar foot actually does have a significant callous buildup around the wound opening this seems to not be doing as well as I would like at this point. I do believe that he would benefit from sharp debridement at this site. Subsequently I think a total contact cast would be helpful for the right foot as well. 08/17/18; the patient arrived today with a total contact cast on the right foot. This actually looks quite good. He had gone to see Dr. Megan Salon of infectious disease about the osteomyelitis on the left foot. I think he is on IV Fortaz at dialysis although I am not exactly sure of the rationale for the Fortaz at the time of this dictation. He also arrived in today with a swelling on the lateral left foot this is the site of his original wounds in fact when I first saw this man when he was under Dr. Ardeen Garland care I think this was the site of where the wound was located. It was not particularly tender however using a small scalpel I opened it to [place] some moderate amount of purulent drainage. We've been using silver  alginate to all wound areas and a total contact cast on the right foot 08/24/18; patient continues on Fortaz at dialysis for osteomyelitis left foot. Last MRI was in May that showed progressive osteomyelitis  and bone destruction of the cuboid and base of the fifth metatarsal. Last week he had an abscess over this same area this was removed. Culture was negative. We are continuing with a total contact cast to the area on the third metatarsal head on the right and making some good progress here. The patient asked me again about renal transplant. He is not on the list because of the open wounds. 08/31/18; apparently the patient had an interruption in the [place] but he is now back on this at dialysis. Apparently there was a misunderstanding and Dr. Crissie Figures orders. Due to have the MRI of the left foot tonight. The area on the right foot continues to have callus thick skin and subcutaneous tissue around the wound edge that requires constant debridement however the wound is smaller we are using a total contact cast in this area. Alginate all wounds 09/07/18; without much surprise the MRI of his left foot showed osteomyelitis in the reminiscent of his fourth metatarsal but also the third metatarsal. She arrives in clinic today with the right foot and a total contact cast. There was purulent drainage coming out of the wound which I have cultured. Marked deterioration here with undermining widely around the wound orifice. Using pickups and a scalpel I remove callus and subcutaneous tissue from a substantial new opening. In a similar fashion the area on the left lateral foot that was blistered last week I have opened this and remove skin and subcutaneous tissue from this area to expose a obvious new wound. I think there is extension and communication between all of this on the left foot. The patient is on Fortaz at dialysis. Culture done of the right foot. We are clearly not making progress here. We have made him  an appointment with [name] of orthopedics. I think an amputation of the left leg may be discussed. I don't think there is anything that can be done with foot salvage. 09/14/18; culture from the right foot last time showed a very resistant MRSA. This is resistant to doxycycline. I'm going to try to get linezolid at least for a week. He does not have a appointment with [name] yet. 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 I'll send a message to Dr. [name] of infectious disease. I want the patient to see [name] to go over the pros and cons of an amputation which I think will be a BKA. The patient had a question about whether this is curative or not. I told him that I thought it would be although spread of staph aureus infection is not unheard of. MRI of the right foot was done at the end of April 2019 did not show osteomyelitis. The patient last saw Dr. [name] on 9/3. I'll send Dr. [name] a message. I would really like him to weigh the pros and cons of a BKA on the left otherwise he'll probably need IV antibiotics and perhaps hyperbaric oxygen again. He has not had a good response to this in the past. His MRI earlier this month showed progressive damage in the remnants of the fourth and third metatarsal 09/21/2018; sees [name] of orthopedics next week to discuss a left BKA in response to the osteomyelitis in the left foot. Using silver alginate to all wound areas. He is completing the Zyvox I did put in for him after last culture showed MRSA 09/29/2018; patient saw [name] of orthopedics discussed  the osteomyelitis in the left foot third and fourth metatarsals. He did not recommend urgent surgery but certainly stated the only surgical option would be a BKA. He is communicating with Dr. [name]. The problem here is the instability of the areas on his foot which constantly generate draining abscesses. I will communicate with Dr.  [name] about this. He has an appointment on 10/23 10/05/2018; patient sees Dr. [name] on 10/23. I have sent him a secure message not ordered any additional antibiotics for now. Patient continues to have 2 open areas on the left plantar foot at the fifth metatarsal head and the lateral aspect of the foot. These have not changed all that much. The area on the right third met head still has thick callus and surrounding subcutaneous tissue we have been using silver alginate 10/12/2018; patient sees Dr. [name] or colleague on 10/23. 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 The area on the right plantar foot is just about closed. The superior wound on the left fifth metatarsal head is callused over but I am not sure if this is closed. The area below it is about the same. Considerable amount of callus on the lateral foot. We have been using silver alginate to the wounds 10/19/2018; the patient saw Dr. [name] on 10/22. He wishes to try and continue to save the left foot. He has been given 6 weeks of oral ciprofloxacin. We continue to put a total contact cast on the right foot. The area over the fifth metatarsal head on the left is callused over/perhaps healed but I did not remove the callus to find out. He still has the wound on the left lateral midfoot requiring debridement. We have been using silver alginate to all wound areas 10/26/2018 On the left foot our intake nurse noted some purulent drainage from the inferior wound [currently the only one that is not callused over] On the right foot even though he is in total contact cast a considerable amount of thick black callus and surface eschar. On this side we have been using silver alginate under a total contact cast. he remains on ciprofloxacin as prescribed by Dr. [name] 11/02/2018; culture from last week which is done of the probing area on the left midfoot wound showed MRSA "few". I am  going to need to contact Dr. [name] which I will do today I think he is going to need IV antibiotics again. The area on the left foot which was callused on the side is clearly separating today all of this was removed a copious amounts of callus and necrotic subcutaneous tissue. The area on the right plantar foot actually remained healthy looking with a healthy granulated base. We are using silver alginate to all wound areas 11/09/2018; unfortunately neither 1 of the patient's wounds areas looks at all satisfactory. On the left he has considerable necrotic debris from the plantar wound laterally over the foot. I removed copious amounts of material including callus skin and subcutaneous tissue. On the right foot he arrives with undermining and frankly purulent drainage. Specimen obtained for culture and debridement of the callused skin and subcutaneous tissue from around the circumference. Because of this I cannot put him back in a total contact cast but to be truthful we are really unfortunately not making a lot of progress I did put in a secure text message to Dr. [name] wondering about the MRSA on the left foot. He suggested vancomycin although this  is not started. I was left wondering if he expected me to send this into dialysis. Has we have more purulence on the right side wait for that result before calling dialysis 11/16/2018; I called dialysis earlier this week to get the vancomycin started. I think he got the first dose on Tuesday. The patient tells me that he fell earlier this week twisting his left foot and ankle and he is swelling. He continues to not look well. He had blood cultures done at dialysis on Tuesday he is not been informed of the results. 12/07/2018; the patient was admitted to hospital from 11/22/2018 through 12/02/2018. He underwent a left BKA. Apparently had staph sepsis. In the meantime being off the right foot this is closed over. He follows up with [name] this  afternoon Readmission: 01/10/19 upon evaluation today patient appears for follow-up in our clinic status post having had a left BKA on 11/20/18. Subsequent to this he actually had multiple falls in fact he tells me to following which calls the wound to be his apparently. He has been seeing [name] and his physician assistant in the interim. They actually did want him to come back for reevaluation to see if there's anything we can do for me wound care perspective the help this area to heal more appropriately. The patient states that he does not have a tremendous amount of pain which is good news. No fevers, chills, nausea, or vomiting noted at this time. We have gotten approval from [name] office had [place] for [place] to treat the patient for his stop wound. This is due to the fact that the patient was in the 90 day postop global. 1/21; the patient does not have an open wound on the right foot although he does have some pressure areas that will need to be padded when he is transferring. The dehisced surgical wound looks clean although there is some undermining of 1.5 cm superiorly. 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 This appears healthy but there is undermining. TheraSkin #1 2/11; TheraSkin #2 2/25; TheraSkin #3. Wound is measuring smaller 3/10; TheraSkin #4 wound is measuring smaller 3/24; TheraSkin #5 wound is measuring smaller and looks healthy. 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 He has no open area on the right foot although he does have some callused area over the original third metatarsal head wound. The third metatarsal is also subluxed on the right foot. I have warned him today that he cannot consider wearing a prosthesis for at least a month but he can go for measurements. He is going to need to keep the right foot padded is much as possible in his diabetic  shoe indefinitely READMISSION 06/12/2019 Mr. Linzy is a type II diabetic on dialysis. He has been in this clinic multiple times with wounds on his bilateral lower extremities. 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 He also has a history of wounds on his right foot but tells me that everything is going well here. He actually came in here with his prosthesis walking with a cane. We were all really quite gratified to see this He states over the last several weeks he has had a painful area at the tip of the left third finger. His dialysis  shunt in his is in the left upper arm and this particularly hurts during dialysis when his fingers get numb and there is a lot of pain in the tip of his left third finger. I believe he is seen vascular surgery. He had a Doppler done to evaluate for a dialysis steal syndrome. He is going for a banding procedure 1 week from today towards the left AV fistula. I think if that is unsuccessful they will be looking at creating a new shunt. He had a course of doxycycline when he which he finished about a week ago. He has been using Bactroban to the finger 6/25; the patient had his banding procedure and things seem to be going better. He is not having pain in his hand at dialysis. The area on the tip of his finger seems about closed. He did however have a paronychia I the last time I saw him. I have been advising him to use topical Bactroban washing the finger. Culture I did last time showed a few staph epidermidis which I was willing to dismiss as superficial skin contaminant. He arrives today with the finger wound looking better however the paronychia a seems worse 7/9; 2-week follow-up. He does not have an open wound on the tip of his third left finger. I thought he had an initial ischemic wound on the tip of the finger as well as a paronychia a medially. All of this  appears to be closed. 8/6-Patient returns after 3 weeks for follow-up and has a right second met head plantar callus with a significant area of maceration hiding an ulcer ABI repeated today on right - 1.26 8/13-Patient returns at 1 week, the right second met head plantar wound appears to be slightly better, it is surrounded by the callus area we are using silver alginate 8/20; the patient came back to clinic 2 weeks ago with a wound on the right second metatarsal head. He apparently told me he developed callus in this area but also went away from his custom shoes to a pair of running shoes. Using silver alginate. 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 There might be much we can do to offload this although the patient tells me he has a wheelchair that he is using to try and stay off the wound is much as possible 8/27; right second metatarsal head. Still small punched out area with thick callus and subcutaneous tissue around the wound. We have been using silver collagen. This is always been an issue with this man's wound especially on this foot 9/3; right second met head. Still the same punched-out area with thick callus and subcutaneous tissue around the wound removing the circumference demonstrates repetitively undermining area. I have removed all the subcutaneous tissue associated with this. We have been using silver collagen 9/15; right second metatarsal head. Same punched-out thick callus and subcutaneous tissue around the wound area. Still requiring debridement we have been using silver collagen I changed back to silver alginate X-ray last time showed no evidence of osteomyelitis. Culture showed Klebsiella and he was started and Keflex apparently just 4 days ago 9/24; right second metatarsal head. He is completed the antibiotics I gave him for 10 days. Same punched-out thick callus and subcutaneous tissue around the area. Still requiring debridement. We have been using  silver alginate. The area itself looks swollen and this could be just [place] that comes on this area with walking on a prosthesis on the left however I  think he needs an MRI and I am going to order that today. There is not an option to offload this further 10/1; right second plantar metatarsal head. MRI booked for October 6. Generally looking better today using silver alginate 10/8; right second plantar metatarsal head. MRI that was done on 10/6 was negative for osteomyelitis noted prior amputations of the second and fourth toes at the [facility]. Prior amputation of the third toe to the base of the middle phalanx. Prior amputation of the fifth toe to the head of the metatarsal. 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 He does not recall about hearing about this. He did not have any pain but then again he has reduced sensation from diabetic neuropathy 10/15; right second plantar metatarsal head. Still in the same condition callus nonviable subcutaneous tissue which cleans up nicely with debridement but reforms by the next week. The patient tells me that he is offloading this is much as he can including taking his wheelchair to dialysis. We have been using silver alginate, changed to silver collagen today 10/22; right second plantar metatarsal head. Wound measures smaller but still thick callus around this wound. Still requiring debridement 11/5 right second plantar metatarsal head. Less callus around the wound circumference but still requiring debridement. There is still some undermining which I cleaned out the wound looks healthy but still considerable punched out depth with a relatively small circumference to the wound there is no palpable bone no purulent drainage 11/12; right second plantar metatarsal head. Small wound with thick callus tissue around the wound and significant undermining. We have been using silver collagen after my continuous  debridements of this area 11/19; patient arrives in today with purulent drainage coming out of the wound in the second third met head area on the right foot. Serosanguineous thick drainage. Specimen obtained for culture. 11/21/2019 on evaluation today patient appears to be doing well with regard to his foot ulcer. The good news is is culture came back negative for any bacteria there was no growth. The other good news is his x-ray also appears to be doing well. The foot is also measuring much better than what it was. Overall I am very pleased with how things seem to be progressing. 12/22; patient was in [facility] for several weeks due to the death of a family member. He said he stayed off the wound using silver alginate. 01/03/2020. Patient has a small open area with roughly 0.3 cm of circumferential undermining. The orifice looks smaller. We have been using silver collagen. There is not a wound more aggressive way to offload this area as he has a prosthesis on the other leg 1/21; again the same small area is 2 weeks ago. We have been using silver collagen I change that to endoform today I really believe that this is an offloading issue 2/4; the area on the right third met head. We have been using endoform. 2/11 the area on the right third met head we have been using silver alginate. 2/25; the area on the right third met head. There is much less callus on this today. The patient states he is offloading this even more aggressively. As an example he is taking his wheelchair into dialysis on most days 3/4; right third metatarsal head. Again he has eschar over the surface of the wound which I removed with a #3 curette. Some subcutaneous debris. This has 0.8 cm of direct probing depth. I cannot feel any  bone here. I did do a culture the area 3/11; right third metatarsal head. Again an eschared area over the wound which almost makes this look superficial. Again debridement reveals a wound  with probing depth probably not much different from last week there is no palpable bone no palpable purulence. The patient tells me that he is making every effort to offload this area properly. He is taking wheelchair into dialysis etc. There is no option for forefoot offloading or a total contact cast. We used Oasis #1 today 3/18; again callus and eschar over the wound surface. I remove this but still a probing wound although only 3 mm in depth this time. This is an improvement 3/25; again callus and eschar over the wound surface which I removed the wound is much deeper today at 0.7 cm. There is no palpable bone. Because of the appearance of this a culture was done. I am not going to put the Oasis back in this. Concerned about underlying infection. Notable for the fact that he is just finished doxycycline for the last go round of this 3/30; still 0.7 cm in depth which is disappointing. Culture I gave last time showed a few Staph aureus which is methicillin susceptible. This should have been taken care of by the recent course of doxycycline I gave him. We are using silver alginate 4/6; almost 1 cm in depth. We have been using silver alginate with underlying Bactroban. 4/15; about 1 cm in depth still. There is no palpable bone but there is undermining thick surface again as usual. Our intake nurse noted what looked to be purulent drainage I suppose that could have been Bactroban but I did a culture for CandS 4/20; depth down 0.7 cm. Culture from last time was negative we have been using Bactroban and silver alginate 4/29; depth down 0.5 cm. Using silver alginate. He reports some drainage 5/6; comes in with the wound worse. Wound is deeper and tunnels. Measures 0.9 depth and an undermining area of 1.8 cm. 5/18; there is a change the dressing last week. Still using Sorbact hydrogel. What I can see of the base of the wound looks better. 6/1. Not much change. I changed him to endoform today. He has 1.1  cm in depth no palpable bone 6/8; again a small punched out wound with significant undermining. Culture I did last week was negative. T oday he had some purulent drainage perhaps mixed with some retained endoform. 6/22; MRI report below IMPRESSION: Skin ulceration on the plantar surface of the foot at the level of the head of the second metatarsal. Mild marrow edema in the head of the second metatarsal could be reactive or due to osteomyelitis. Thin fluid collection surrounding the head and neck of the second metatarsal appears to communicate with the patient's skin wound and is worrisome for a septic collection. Advanced first MTP osteoarthritis. Prior amputations as noted above. Electronically Signed By: Inge Rise M.D. On: 06/16/2020 11:51 PCR culture I did last time I believe showed peptostreptococcus. There was no comment on any other gram-negative or gram-positive organisms. I gave him a course of Flagyl which I think he is finishing up. We are not making any progress with the wound over the right second/third metatarsal head. We are using silver alginate putting him in kerlix Coban 6/29; has his appointment with [name] of orthopedics next Tuesday I think we see him on the same day. He was in for a nurse visit today. Nurse noted undermining of skin and callus  so I was asked to look at this. We have been using silver alginate and kerlix Coban 7/6; patient saw Dr. [name] PA today will see [name] on Tuesday. The major question is does he needs surgery to clean out the area in question culture question resect any involved bone etc. Otherwise he will need to see infectious disease for broad-spectrum antibiotics given at dialysis 7/13; I talked to [name] late last week. He was not impressed with the MRI results thinking that the bone changes may have been reactive rather than osteomyelitis. He did not think he needed drainage of an abscess thinking of the fluid we are seeing  was superficial and contiguous with the actual wound. He did not think he needed surgery or resection. Recommended consideration of infectious disease and return him to our care. We have been using silver alginate 7/20; PCR culture I did last week showed staph aureus methicillin sensitive. I have him on twice daily Keflex. Silver alginate to the wound 7/27; I have continued his 500 every 12 of cephalexin with a second dose after dialysis. My intention is to give him 4 to 6 weeks of antibiotics directed at [facility] 07/31/2020 on evaluation today patient appears to be doing a little better in regard to his overall wound size. This is good news. He does have a fairly significant callus buildup still and I am can work on that today. Fortunately there is no signs of active infection at this time. He has been on Keflex and has an extension of that that he is going to pick up and continue taking. Electronic Signature(s) Signed: 07/31/2020 3:49:38 PM By: [name] Keeler PA-C Entered By: [name] on 07/31/2020 15:49:38 -------------------------------------------------------------------------------- Physical Exam Details Patient Name: Date of Service: [name], ETSITTY 07/31/2020 2:30 PM Medical Record Number: [id number] Patient Account Number: [id number] Date of Birth/Sex: Treating RN: [birth year] (69 y.o. [name] Primary Care Provider: Kristie [name] Other Clinician: Referring Provider: Treating Provider/Extender: [name] in Treatment: 66 Constitutional Well-nourished and well-hydrated in no acute distress. Respiratory normal breathing without difficulty. Psychiatric this patient is able to make decisions and demonstrates good insight into disease process. Alert and Oriented x 3. pleasant and cooperative. Notes Upon inspection patient's wound bed actually showed signs of good granulation at this time there does not appear to be any evidence of active infection  which is great news and overall very pleased with where things stand. There is no signs that the patient overall is showing any worsening and he is using his diabetic shoe at this point with his prosthetic on the left he really cannot use an offloading shoe though he tells me staying in his wheelchair most of the time. Electronic Signature(s) Signed: 07/31/2020 3:50:07 PM By: [name] Keeler PA-C Entered By: [name] on 07/31/2020 15:50:07 -------------------------------------------------------------------------------- Physician Orders Details Patient Name: Date of Service: [name], [name] 07/31/2020 2:30 PM Medical Record Number: [id number] Patient Account Number: [id number] Date of Birth/Sex: Treating RN: 05/14/1951 (69 y.o. 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  Dressing Change Frequency Wound #41 Right Metatarsal head second Do not change entire dressing for one week. Skin Barriers/Peri-Wound Care Moisturizing lotion Wound Cleansing May shower with protection. - patient to use a cast protector Primary Wound Dressing Wound #41 Right Metatarsal head second Calcium Alginate with Silver Secondary Dressing Wound #41 Right Metatarsal head second Dry Gauze Other: - felt callous pad Edema Control Kerlix and Coban - Right Lower Extremity - cotton cast padding instead of kerlix Avoid standing for long periods of time Elevate legs to the level of the heart or above  for 30 minutes daily and/or when sitting, a frequency of: - throughout the day Additional Orders / Instructions Other: - minimize walking and standing on foot to aid in wound healing and callus buildup. Electronic Signature(s) Signed: 07/31/2020 5:21:32 PM By: [name] Hurst RN, BSN Signed: 07/31/2020 6:02:08 PM By: [name] Keeler PA-C Entered By: [name] on 07/31/2020 15:48:38 -------------------------------------------------------------------------------- Problem List Details Patient Name: Date of Service: [name], [name] 07/31/2020 2:30 PM Medical Record Number: [id number] Patient Account Number: [id number] Date of Birth/Sex: Treating RN: [birth year] (69 y.o. 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, other extremity 06/12/2019 No Yes L03.115 Cellulitis of right lower limb 11/15/2019 No Yes Inactive Problems ICD-10 Code Description Active Date Inactive Date L03.114 Cellulitis of left upper limb 06/12/2019 06/12/2019 L98.498 Non-pressure chronic ulcer of skin of other sites with other specified severity 06/12/2019 06/12/2019 S61.209D Unspecified open wound of unspecified finger without damage to nail, subsequent 06/12/2019 06/12/2019 encounter Resolved Problems Electronic Signature(s) Signed: 07/31/2020 2:49:01 PM By: [name] Keeler PA-C Entered By: [name] on 07/31/2020 14:49:00 -------------------------------------------------------------------------------- Progress Note Details Patient Name: Date of Service: [name], [name].  07/31/2020 2:30 PM Medical Record Number: [id number] Patient Account Number: [id number] Date of Birth/Sex: Treating RN: 1951/11/16 (69 y.o. [name] Primary Care Provider: Kristie [name] Other Clinician: Referring Provider: Treating Provider/Extender: [name] in Treatment: 2 Subjective Chief Complaint Information obtained from Patient Left BKA ulcer 06/12/2019; patient is here for review of wound on the tip of the left third finger History of Present Illness (HPI) The following HPI elements were documented for the patient's wound: Location: Patient presents with a wound to bilateral feet. Quality: Patient reports experiencing essentially no pain. Severity: Mildly severe wound with no evidence of infection Duration: Patient has had the wound for greater than 2 weeks prior to presenting for treatment Stomatitis cyst the patient is a pleasant 69 yrs old bm here for evaluation of ulcers on the plantar aspect of both feet. 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 He has a left arm graft for access. He has been trying to stay off his feet for weeks but does not seem to have any improvement in the wound. He has been seeing someone at the foot center and was referred to the Wound Care center for further evaluation. 12/30/15 the patient has 3 wounds one over the right first metatarsal head and 2 on the left foot at the left fifth and lleft first metatarsal head. All of these look  relatively similar. The one over the left fifth probe to bone I could not prove that any of the others did. I note his MRI in November that did not show osteomyelitis. His peripheral pulses seem robust. All of these underwent surgical debridement to remove callus nonviable skin and subcutaneous tissue 01/06/16; the patient had his sutures removed from the right fifth ray amputation. There may be a small open part of this superiorly but  otherwise the incision looks good. Areas over his right first, left first and left fifth metatarsal head all underwent surgical debridement as a varying degree of callus, skin and nonviable subcutaneous tissue. The area that is most worrisome is the right fifth metatarsal head which has a wound probes precariously close to bone. There is no purulent drainage or erythema 01/13/16; I'm not exactly sure of the status of the right fifth ray amputation site however he follows with [name] later this week. The area over his right first plantar metatarsal head, left first and fifth plantar metatarsal head are all in the same status. Thick circumferential callus, nonviable subcutaneous tissue. Culture of the left fifth did not culture last week 01/20/16; all of the patient's wounds appear and roughly the same state although his amputation site on the right lateral foot looks better. His wounds over the right first, left first and left fifth metatarsal heads all underwent difficult surgical debridement removing circumferential callus nonviable skin and subcutaneous tissue. There is no overt evidence of infection in these areas. MRI at the end of December of the left foot did not show osteomyelitis, right foot showed osteomyelitis of the right fifth digit he is now status post amputation. 01/27/16 the patient's wounds over his plantar first and fifth metatarsal heads on the left all appear better having been started on a total contact cast last week. They were debridement of circumferential callus and nonviable subcutaneous tissue as was the wound over the first metatarsal head. His surgical incision on the right also had some light surface debridement done. 03/23/2016 -- the patient was doing really well and most of his wounds had almost completely healed but now he came back today with a history of having a discharge from the area of his right foot on the plantar aspect and also between his first and second  toe. He also has had some discharge from the left foot. Addendum: I spoke to the PA [name] at the dialysis center whose fax number is [phone]. We discussed the infection the patient has and she will put the patient on vancomycin and Fortaz until the final culture report is back. We will fax this as soon as available. 03/30/2016 -- left foot x-ray IMPRESSION:No definitive osteomyelitis noted. X-ray of the right foot -- IMPRESSION: 1. Soft tissue swelling. Prior amputation right fifth digit. No acute or focal bony abnormality identified. If osteomyelitis remains a clinical concern, MRI can be obtained. 2. Peripheral vascular disease. His culture reports have grown an MSSA -- and we will fax this report to his hemodialysis center. He was called in a prescription of oral doxycycline but I have told her not to fill this in as he is already on IV antibiotics. 04/06/2016 -- a few days ago, I spoke to the hemodialysis center nurse who had stopped the IV antibiotics and he was given a prescription for doxycycline 100 mg by mouth twice a day for a week and he is on this at the present time. 05/11/2016 -- he has recently seen his PCP  this week and his hemoglobin A1c was 7. He is working on his paperwork to get his orthotic shoes. 05/25/2016 -- -- x-ray of the left foot IMPRESSION:No acute bony abnormality. No radiographic changes of acute osteomyelitis. No change since prior study. X-ray of the right foot -- IMPRESSION: Postsurgical changes are seen involving the fifth toe. No evidence of acute osteomyelitis. he developed a large blister on the medial part of his right foot and this opened out and drain fluid. 06/01/2016 -- he has his MRI to be done this afternoon. 06/15/2016 -- MRI of the left forefoot without contrast shows 2 separate regions of cutaneous and subcutaneous edema and possible ulceration and blistering along the ball of the foot. No obvious osteomyelitis identified. MRI of the  right foot showed cutaneous and subcutaneous thickening plantar to the first digit sesamoid with an ulcer crater but no underlying osteomyelitis is identified. 06/22/2016 -- the right foot plantar ulcer has been draining a lot of seropurulent material for the last few days. 06/30/2016 -- spoke to the dialysis center and I believe I spoke to [place] a PA at the center who discussed with me and agreed to putting Legrand Como on vancomycin until his cultures arrive. On review of his culture report no WBCs were seen or no organisms were seen and the culture was reincubated for better growth. The final report is back and there were no predominant growth including Streptococcus or Staphylococcus. Clinically though he has a lot of drainage from both wounds a lot of undermining and there is further blebs on the left foot towards the interspace between his first and second toe. 08/10/2016 -- the culture from the right foot showed normal skin flora and there was no Staphylococcus aureus was group A streptococcus isolated. 09/21/2016 -- -- MRI of the right foot was done on 09/13/2016 - IMPRESSION: Findings most consistent with acute osteomyelitis throughout the great toe, sesamoid bones and plantar aspect of the head of the first metatarsal. Fluid in the sheath of the flexor tendon of the great toe could be sympathetic but is worrisome for septic tenosynovitis. First MTP joint effusion worrisome for septic joint. He was admitted to the hospital on 09/11/2016 and treated for a fever with vancomycin and cefepime. He was seen by Dr. Bobby Rumpf of infectious disease who recommended 6 weeks treatment with vancomycin and ceftazidime with his hemodialysis and to continue to see as in the wound clinic. Vascular consult was pending. The patient was discharged home on 09/14/2016 and he would continue with IV antibiotics for 6 weeks. 09/28/16 wound appears reasonably healthy. Continuing with total contact cast 10/05/16  wound is smaller and looking healthy. Continue with total contact cast. He continues on IV antibiotics 10/12/2016 -- he has developed a new wound on the dorsal aspect of his left big toe and this is a superficial injury with no surrounding cellulitis. He has completed 30 days of IV antibiotics and is now ready to start his hyperbaric oxygen therapy as per his insurance company's recommendation. 10/19/2016 -- after the cast was removed on the right side he has got good resolution of his ulceration on the right plantar foot. he has a new wound on the left plantar foot in the region of his fourth metatarsal and this will need sharp debridement. 10/26/2016 -- Xray of the right foot complete: IMPRESSION: Changes consistent with osteomyelitis involving the head of the first metatarsal and base of the first proximal phalanx. The sesamoid bones are also likely involved given their positioning. 11/02/2016 --  there still awaiting insurance clearance for his hyperbaric oxygen therapy and hopefully he will begin treatment soon. 11/09/2016 -- started with hyperbaric oxygen therapy and had some barotrauma to the right ear and this was seen by ENT who was prescribed Afrin drops and would probably continue with HBO and he is scheduled for myringotomy tubes on Friday 11/16/2016 -- he had his myringotomy tubes placed on Friday and has been doing much better after that with some fluid draining out after hyperbaric treatment today. The pain was much better. 11/30/2016 -- over the last 2 days he noticed a swelling and change of color of his right second toe and this had been draining minimal fluid. 12/07/2016 -- x-ray of the right foot -- IMPRESSION:1. Progressive ulceration at the distal aspect of the second digit with significant soft tissue swelling and osseous changes in the distal phalanx compatible with osteomyelitis. 2. Chronic osteomyelitis at the first MTP joint. 3. Ulcerations at the second and third toes as  well without definite osseous changes. Osteomyelitis is not excluded. 4. Fifth digit amputation. On 12/01/2016 oo I spoke to Dr. Bobby Rumpf, the infectious disease specialist, who kindly agreed to treat this with IV antibiotics and he would call in the order to the dialysis center and this has been discussed in detail with the patient who will make the appropriate arrangements. The patient will also book an appointment as soon as possible to see Dr. [name] in the office. Of note the patient has not been on antibiotics this entire week as the dialysis center did not receive any orders from Dr. [name]. I got in touch with Dr. [name] who tells me the patient has an appointment to see him this coming Wednesday. 12/14/2016 -- the patient is on ceftazidime and vancomycin during his dialysis, and I understand this was put on by his nephrologist Dr. Raliegh Ip possibly after speaking with infectious disease Dr. [name] 12/23/16; patient was on my schedule today for a wound evaluation as he had difficulties with his schedule earlier this week. I note that he is on vancomycin and ceftazidine at dialysis. In spite of this he arrives today with a new wound on the base of the right second toe this easily probes to bone. The known wound at the tip of the second toe With this area. He also has a superficial area on the medial aspect of the third toe on the side of the DIP. This does not appear to have much depth. The area on the plantar left foot is a deep area but did not probe the bone 12/28/16 -- he has brought in some lab work and the most recent labs done showed a hemoglobin of 12.1 hematocrit of 36.3, neutrophils of 51% WBC count of 5.6, BN of 53, albumin of 4.1 globulin of 3.7, vancomycin 13 g per mL. 01/04/2017 -- he saw [name] who has recommended a amputation of the right second toe and he is awaiting this date. He sees Dr. Bobby Rumpf of infectious disease tomorrow. The bone culture taken on  12/28/2016 had no growth in 2 days. 01/11/2017 -- was seen by Dr. Bobby Rumpf regarding the management and has recommended a eval by vascular surgeons. He recommended to continue the antibiotics during his hemodialysis. Xray of the left foot -- IMPRESSION: No acute fracture, dislocation, or osseous erosion identified. 01/18/2017 -- he has his vascular workup later today and he is going to have his right foot second toe amputation this coming Friday by [name]. We have  also put in for a another 30 treatments with hyperbaric oxygen therapy 01/25/2017 -- I have reviewed Dr. Donnetta Hutching is vascular report from last week where he reviewed him and thought his vascular function was good enough to heal his amputation site and no further tests were recommended. He also had his orthopedic related to surgery which is still pending the notes and we will review these next week. 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 02/08/2017 --he was seen by Dr. [name] on 02/03/2017 -- patient is on [place]. after a thorough review he had recommended to continue with antibiotics during hemodialysis. The patient was seen by [name], but I do not find any follow-up note on the electronic medical record. he has removed the dressing over the right foot to remove the sutures and asked him to see him back in a month's time 02/22/2017 -- patient has been febrile and has been having symptoms of the upper respiratory tract infection but has not been checked for the flu and it's been over 4 days now. He says he is feeling  better today. Some drainage between his left first and second toe and this needed to be looked at 03/08/2017 -- was seen by Dr. Bobby Rumpf on 03/07/2017 after review he will stop his antibiotics and see him back in a month to see if he is feeling well. 03/15/2017 -- he has completed his course of hyperbaric oxygen therapy and is doing fine with his health otherwise. 03/22/2017 -- his nutritionist at the dialysis center has recommended a protein supplement to help build his collagen and we will prescribe this for him when he has the details 05/03/2017 -- he recently noticed the area on the plantar aspect of his fifth metatarsal head which opened out and had minimal drainage. 05/10/2017 -- -- x-ray of the left foot -- IMPRESSION:1. No convincing conventional radiographic evidence of active osteomyelitis.2. Active soft tissue ulceration at the tip of the fifth digit, and at the lateral aspect of the foot adjacent the base of the fifth metatarsal. 3. Surgical changes of prior fourth toe amputation. 4. 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 5. Small vessel atherosclerotic vascular calcifications. 6. 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 ======= Readmission after 5 weeks: 06/15/2017 -- the patient returns after 5 weeks having had an MRI done on 05/17/2017 MRI of the left foot without contrast showed soft tissue ulcer overlying the base of the fifth metatarsal. 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 No drainable fluid collection to suggest an abscess. He was in hospital between 05/16/2017 and 05/25/2017 -- and on discharge was asked to follow-up with [name] and Dr. [name]. He was treated 2 weeks post discharge with vancomycin plus ceftriaxone with hemodialysis and oral metronidazole 500 mg 3 times a day. The patient underwent a left fifth ray amputation and  excision on 5/23, but continued to have postoperative spikes of fever. last hemoglobin A1c was 6.7% He had a postoperative MR of the foot on 05/23/2017 -- which showed no new areas of  cortical bone loss, edema or soft tissue ulceration to suggest osteomyelitis. the patient has completed his course of IV antibiotics during dialysis and is to see the infectious disease doctor tomorrow. [name] had seen him after suture removal and asked him to keep the wound with a dry dressing 06/21/2017 -- he was seen by Dr. Bobby Rumpf of infectious disease on 06/16/2017 -- he stopped his Flagyl and will see him back in 6 weeks. He was asked to follow-up with [name] and with the wound clinic clinic. 07/05/17; bone biopsy from last time showed acute osteomyelitis. This is from the reminiscent in the left fifth metatarsal. Culture result is apparently still pending [holding for anaerobe}. From my understanding in this case this is been a progressive necrotic wound which is deteriorated markedly over the last 3 weeks since he returned here. He now has a large area of exposed bone which was biopsied and cultured last week. Dr. Graylon Good has put him on vancomycin and Fortaz during his hemodialysis and Flagyl orally. He is to see [name] next week 07/19/2017 -- the patient was reviewed by [name] of orthopedics who reviewed the case in detail and agreed with the plan to continue with IV antibiotics, aggressive wound care and hyperbaric oxygen therapy. He would see him back in 3 weeks' time 08/09/2017 -- saw Dr. Bobby Rumpf on 08/03/2017 -he was restarted on his antibiotics for 6 weeks which included vanco, ceftaz and Flagyl. he recommended continuing his antibiotics for 6 weeks and reevaluate his completion date. He would continue with wound care and hyperbaric oxygen therapy. 08/16/2017 -- I understand he will be completing his 6 weeks of antibiotics sometime later this week. 09/19/2017 -- he has been  without his wound VAC for the last week due to lack of supplies. He has been packing his wound with silver alginate. 10/04/2017 -- he has an appointment with Dr. [name] tomorrow and hence we will not apply the wound VAC after his dressing changes today. 10/11/2017 -- he was seen by Dr. Bobby Rumpf on 10/05/2017, noted that the patient was currently off antibiotics, and after thorough review he recommended he follow-up with the wound care as per plan and no further antibiotics at this point. 11/29/17; patient is arrived for the wound on his left lateral foot.Marland Kitchen He is completed IV antibiotics and 2 rounds of hyperbaric oxygen for treatment of underlying osteomyelitis. He arrives today with a surface on most of the wound area however our intake nurse noted drainage from the superior aspect. This was brought to my attention. 12/13/2017 -- the right plantar foot had a large bleb and once the bleb was opened out a large callus and subcutaneous debris was removed and he has a plantar ulcer near the fourth metatarsal head 12/28/17 on evaluation today patient appears to be doing acutely worse in regard to his left foot. The wound which has been appearing to do better as now open up more deeply there is bone palpable at the base of the wound unfortunately. He tells me that this feels "like it did when he had osteomyelitis previously" he also noted that his second toe on the left foot appears to be doing worse and is swollen there does appear to be some fluid collected underneath. His right foot plantar ulcer appears to be doing somewhat better at this point and there really is no complication at the site currently. No fevers, chills, nausea, or vomiting noted at this time. Patient states that he normally has  no pain at this site however. T oday he is having significant pain. 01/03/18; culture done last week showed methicillin sensitive staph aureus and group B strep. He was on [place] however he arrives today with  a fever of 101. He has a functional dialysis shunt in his left arm but has had no pain here. No cough. He still makes some urine no dysuria he did have abdominal pain nausea and diarrhea over the weekend but is not had any diarrhea since yesterday. Otherwise he has no specific complaints 01/05/18; the patient returns today in follow-up for his presentation from 1/8. At that point he was febrile. I gave him some Levaquin adjusted for his dialysis status. He tells me the fever broke that night and it is not likely that this was actually a wound infection. He is gone on to have an MRI of the left foot; this showed interval development of an abnormal signal from the base of the third and fourth metatarsal and in the cuboid reminiscent consistent with osteomyelitis. There is no mention of the left second toe I think which was a concern when it was ordered. The patient is taking his Levaquin 01/10/18; the patient has completed his antibiotics today. The area on the left lateral foot is smaller but it still probes easily to bone. He has underlying osteomyelitis here and sees Dr. Megan Salon of infectious disease this week ooThe area on the right third plantar metatarsal head still requires debridement not much change in dimensions [facility] has a new wound on the medial tip of his right third toe again this probes to bone. He only noticed this 2 days ago 01/17/18; the patient saw Dr. [name] last week and he is back on Vanco and Fortaz at dialysis. 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 We are previously seeing him for a wound also on the base of the fourth med head on the right. X-ray of this area did not show osteomyelitis in relation to a new wound on the tip of his right third toe. Again this probes to bone. Finally his second toe on the left which didn't really show anything on the MRI at least no  report appears to have a separated cutaneous area which I think is going to come right off the tip of his toe and leave exposed bone. Whether this is infectious or ischemic I am not clear Culture I did from the third toe last week which was his new wound was negative. Plain x-ray on the right foot did not show osteomyelitis in the toes 01/24/18; the patient is going went to see [name]. He is going to have a amputation of the left second and right third toe although paradoxically the left second toe looks better than last week. We have treating a deep probing wound on the left lateral foot and the area over the third metatarsal head on the right foot. 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 Also a debridement including bone of the left lateral foot and a closure which is still sutured. This is a bit surprising. Also apparent debridement of the base of the right third toe wound. Bit difficult to tell what he is been doing but I think it is Xeroform to the amputation sites and the left lateral foot and silver alginate to the right plantar foot 02/09/18; on  Rocephin at dialysis for osteomyelitis in the left lateral foot. I'm not sure exactly where we are in the frame of things here. The wound on the left lateral foot is still sutured also the amputation sites of the left second and right third toe. He is been using Xeroform to the sutured areas silver alginate to the right foot 02/14/18; he continues on Rocephin at dialysis for osteomyelitis. I still don't have a good sense of where we are in the treatment duration. The wounds on the left lateral foot had the sutures removed and this clearly still probes deeply. We debrided the area with a #5 curet. We'll use silver alginate to the wound oothe area over the right third met head also required debridement of callus skin and subcutaneous tissue and necrotic debris over the wound surface. This tunnels superiorly but I did  not unroofed this today. ooThe areas on the tip of his toes have a crusted surface eschar I did not debridement this either. 02/21/18; he continues on Rocephin at dialysis for osteomyelitis. I don't have a good sense of where we are and the treatment duration. The wounds on the left lateral foot is callused over on presentation but requires debridement. The area on the right third med head plantar aspect actually is measuring smaller 02/28/18;patient is on Vanco and Fortaz at dialysis, I'm not sure why I thought this was Rocephin above unless it is been changed. I don't have a good sense of time frame here. Using silver collagen to the area on the left lateral foot and right third med head plantar foot 03/07/18; patient is completing bank and Fortaz at dialysis soon. He is using Silver collagen to the area on the left lateral foot and the right third metatarsal head. He has a smaller superficial wound distal to the left lateral foot wound. 03/14/18-he is here in follow-up evaluation for multiple ulcerations to his bilateral feet. He presents with a new ulceration to the plantar aspect of the left foot underneath the blister, this was deroofed to reveal a partial thickness ulcer. He is voicing no complaints or concerns, tolerated dialysis yesterday. We will continue with same treatment plan and he will follow-up next week 03/21/18; the patient has a area on the lateral left foot which still has a small probing area. The overall surface area of the wound is better. He presented with a new ulceration on the left foot plantar fifth metatarsal head last week. The area on the right third metatarsal head appears smaller.using silver alginate to all wound areas. The patient is changing the dressing himself. He is using a [place] forefoot offloading are on the right and a healing sandal on the left 03/28/18; original wound left lateral foot. Still nowhere close to looking like a heeling surface oosecondary wound on  the left lateral foot at the base of his question fifth metatarsal which was new last week ooRight third metatarsal head. ooWe have been using silver alginate all wounds 04/04/18; the original wound on the left lateral foot again heavy callus surrounding thick nonviable subcutaneous tissue all requiring debridement. The new wound from 2 weeks ago just near this at the base of the fifth metatarsal head still looks about the same ooUnfortunately there is been deterioration in the third medical head wound on the right which now probes to bone. I must say this was a superficial wound at one point in time and I really don't have a good frame of reference year. I'll have to go back  to look through records about what we know about the right foot. 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 He is also been to see [name]. Somewhere in here as somebody is ordered a VASCULAR evaluation which was done on 03/22/18. On the right is anterior tibial artery was monophasic triphasic at the posterior tibial artery. On the left anterior tibial artery monophasic posterior tibial artery monophasic. ABI in the right was 1.43 on the left 1.21. TBIs on the right at 0.41 and on the left at 0.32. A vascular consult was recommended and I think has been arranged. 04/11/18; Mr. Crochet has not had an MRI of the right foot since 2017. Recent x-ray of the right foot done in January and February was negative. However he has had a major deterioration in the wound over the third met head. He is completed antibiotics last week at dialysis [place]. I think he is going to see vascular in follow-up. The areas on the left lateral foot and left plantar fifth metatarsal head both look satisfactory. I debrided both of these areas although the tissue here looks good. The area on the left lateral foot once probe to bone it certainly does  not do that now 04/18/18; MRI of the right foot is on Thursday. He has what looks to be serosanguineous purulent drainage coming out of the wound over the right third metatarsal head today. He completed antibiotics bank and Fortaz 2 weeks ago at dialysis. He has a vascular follow-up with regards to his arterial insufficiency although I don't exactly see when that is booked. The areas on the left foot including the lateral left foot and the plantar left fifth metatarsal head look about the same. 04/25/18-He is here in follow-up evaluation for bilateral foot ulcers. MRI obtained was negative for osteomyelitis. Wound culture was negative. We will continue with same treatment plan he'll follow-up next week 05/02/18; the right plantar foot wound over the third metatarsal head actually looks better than when I last saw this. His MRI was negative for osteomyelitis wound culture was negative. ooOn the left plantar foot both wounds on the plantar fifth metatarsal head and on the lateral foot both are covered in a very hard circumferential callus. 05/09/18; right plantar foot wound over the third metatarsal head stable from last week. ooLeft plantar foot wound over the fifth metatarsal head also stable but with callus around both wound areas ooThe area on the left lateral foot had thick callus over a surface and I had some thoughts about leaving this intact however it felt boggy. ooWe've been using silver alginate all wounds 05/16/18; since the patient was last here he was hospitalized from 5/15 through 5/19. 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 This grew MRSA. He was placed on vancomycin.MRI of the left foot suggested "progressive" osteomyelitis and bone destruction of the cuboid and the base of the fifth metatarsal. Stable erosive changes of the base of the second metatarsal. I'm wondering if they're  aware that he had surgery and debridement in the area of the underlying bone previously by [name]. In any case, He was also revascularized with an angioplasty and stenting of the left tibial peroneal trunk and angioplasty of the left posterior tibial artery. He is on vancomycin at dialysis. He has a 2  week follow-up with infectious disease. He has vascular surgery follow-up. He was started on Plavix. 05/23/18; the patient's wound on the lateral left foot at the level of the fifth metatarsal head and the plantar wound on the plantar fifth metatarsal head both look better. The area on the right third metatarsal head still has depth and undermining. We've been using silver alginate. He is on vancomycin. He has been revascularized on the left 05/30/18; he continues on vancomycin at dialysis. Revascularized on the left. The area on the left lateral foot is just about closed. Unfortunately the area over the plantar fifth metatarsal head undermining superiorly as does the area over the right third metatarsal head. Both of these significantly deteriorated from last week. He has appointments next week with infectious disease and orthopedics I delayed putting on a cast on the left until those appointments which therefore we made we'll bring him in on Friday the 14th with the idea of a cast on the left foot 06/06/18; he continues on vancomycin at dialysis. Revascularized on the left. He arrives today with a ballotable swelling just above the area on the left lateral foot where his previous wound was. He has no pain but he is reasonably insensate. The difficult areas on the plantar left fifth metatarsal head looks stable whereas the area on the right third plantar metatarsal head about the same as last week there is undermining here although I did not unroofed this today. He required a considerable debridement of the swelling on the left lateral foot area and I unroofed an abscess with a copious amount of brown  purulent material which I obtained for culture. I don't believe during his recent hospitalization he had any further imaging although I need to review this. Previous cultures from this area done in this clinic showed MRSA, I would be surprised if this is not what this is currently even though he is on vancomycin. 06/13/18; he continues on vancomycin at dialysis however he finishes this on Sunday and then graduates the doxycycline previously prescribed by Dr. [name]. The abscess site on the lateral foot that I unroofed last time grew moderate amounts of methicillin-resistant staph aureus that is both vancomycin and tetracycline sensitive so we should be okay from that regard. He arrives today in clinic with a connection between the abscess site and the area on the lateral foot. We've been using silver alginate to all his wound areas. The right third metatarsal head wound is measuring smaller. Using silver alginate on both wound areas 06/20/18; transitioning to doxycycline prescribed by Dr. [name]. The abscess site on the lateral foot that I unroofed 2 weeks ago grew MRSA. That area has largely closed down although the lateral part of his wound on the fifth metatarsal head still probes to bone. I suspect all of this was connected. ooOn the right third metatarsal head smaller looking wound but surrounded by nonviable tissue that once again requires debridement using silver alginate on both areas 06/27/18; on doxycycline 100 twice a day. The abscess on the lateral foot has closed down. Although he still has the wound on the left fifth metatarsal head that extends towards the lateral part of the foot. The most lateral part of this wound probes to bone ooRight third metatarsal head still a deep probing wound with undermining. Nevertheless I elected not to debridement this this week ooIf everything is copious static next week on the left I'm going to attempt a total contact cast 07/04/18; he is tolerating  his doxycycline.  The abscess on the left lateral foot is closed down although there is still a deep wound here that probes to bone. We will use silver collagen under the total contact cast Silver collagen to the deep wound over the right third metatarsal head is well 07/11/18; he is tolerating doxycycline. The left lateral fifth plantar metatarsal head still probes deeply but I could not probe any bone. We've been using silver collagen and this will be the second week under the total contact cast ooSilver collagen to deep wound over the right third metatarsal head as well 07/18/18; he is still taking doxycycline. The left lateral fifth plantar metatarsal this week probes deeply with a large amount of exposed bone. Quite a deterioration. He required extensive debridement. Specimens of bone for pathology and CNS obtained ooAlso considerable debridement on the right third metatarsal head 07/25/18; bone for pathology last week from the lateral left foot showed osteomyelitis. CandS of the bone Enterobacter. And Klebsiella. He is now on ciprofloxacin and the doxycycline is stopped [Dr. [name] of infectious disease] area He has an appointment with Dr. [name] tomorrow I'm going to leave the total contact cast off for this week. May wish to try to reapply that next week or the week after depending on the wound bed looks. I'm not sure if there is an operative option here, previously is followed with [name] 08/01/18 on evaluation today patient presents for reevaluation. He has been seen by infectious disease, Dr. [name], and he has placed the patient back on Ceftaz currently to be given to him at dialysis three times a week. 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 Fortunately there does not appear to be the evidence of infection at this point in time. Overall the patient has been tolerating the dressing changes without complication.  We are no longer due lives in the cast of left foot secondary to the infection obviously. 08/10/18 on evaluation today patient appears to be doing rather well all things considered in regard to his left plantar foot ulcer. He does have a significant callous on the lateral portion of his left foot he wonders if I can help clean that away to some degree today. Fortunately he is having no evidence of infection at this time which is good news. No fevers chills noted. With that being said his right plantar foot actually does have a significant callous buildup around the wound opening this seems to not be doing as well as I would like at this point. I do believe that he would benefit from sharp debridement at this site. Subsequently I think a total contact cast would be helpful for the right foot as well. 08/17/18; the patient arrived today with a total contact cast on the right foot. This actually looks quite good. He had gone to see Dr. Megan Salon of infectious disease about the osteomyelitis on the left foot. I think he is on IV Fortaz at dialysis although I am not exactly sure of the rationale for the Fortaz at the time of this dictation. He also arrived in today with a swelling on the lateral left foot this is the site of his original wounds in fact when I first saw this man when he was under Dr. Ardeen Garland care I think this was the site of where the wound was located. It was not particularly tender however using a small scalpel I opened it to [place] some moderate amount of  purulent drainage. 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 Last MRI was in May that showed progressive osteomyelitis and bone destruction of the cuboid and base of the fifth metatarsal. Last week he had an abscess over this same area this was removed. Culture was negative. ooWe are continuing with a total  contact cast to the area on the third metatarsal head on the right and making some good progress here. The patient asked me again about renal transplant. He is not on the list because of the open wounds. 08/31/18; apparently the patient had an interruption in the [facility] but he is now back on this at dialysis. Apparently there was a misunderstanding and Dr. Crissie Figures orders. Due to have the MRI of the left foot tonight. ooThe area on the right foot continues to have callus thick skin and subcutaneous tissue around the wound edge that requires constant debridement however the wound is smaller we are using a total contact cast in this area. Alginate all wounds 09/07/18; without much surprise the MRI of his left foot showed osteomyelitis in the reminiscent of his fourth metatarsal but also the third metatarsal. She arrives in clinic today with the right foot and a total contact cast. There was purulent drainage coming out of the wound which I have cultured. Marked deterioration here with undermining widely around the wound orifice. Using pickups and a scalpel I remove callus and subcutaneous tissue from a substantial new opening. ooIn a similar fashion the area on the left lateral foot that was blistered last week I have opened this and remove skin and subcutaneous tissue from this area to expose a obvious new wound. I think there is extension and communication between all of this on the left foot. ooThe patient is on Fortaz at dialysis. Culture done of the right foot. We are clearly not making progress here. ooWe have made him an appointment with [name] of orthopedics. I think an amputation of the left leg may be discussed. I don't think there is anything that can be done with foot salvage. 09/14/18; culture from the right foot last time showed a very resistant MRSA. This is resistant to doxycycline. I'm going to try to get linezolid at least for a week. He does not have a appointment with [name]  yet. 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 I'll send a message to Dr. [name] of infectious disease. I want the patient to see [name] to go over the pros and cons of an amputation which I think will be a BKA. The patient had a question about whether this is curative or not. I told him that I thought it would be although spread of staph aureus infection is not unheard of. MRI of the right foot was done at the end of April 2019 did not show osteomyelitis. The patient last saw Dr. [name] on 9/3. I'll send Dr. [name] a message. I would really like him to weigh the pros and cons of a BKA on the left otherwise he'll probably need IV antibiotics and perhaps hyperbaric oxygen again. He has not had a good response to this in the past. His MRI earlier this month showed progressive damage in the remnants of the fourth and third metatarsal 09/21/2018; sees [name] of orthopedics next week to discuss a left  BKA in response to the osteomyelitis in the left foot. Using silver alginate to all wound areas. He is completing the Zyvox I did put in for him after last culture showed MRSA 09/29/2018; patient saw [name] of orthopedics discussed the osteomyelitis in the left foot third and fourth metatarsals. He did not recommend urgent surgery but certainly stated the only surgical option would be a BKA. He is communicating with Dr. [name]. The problem here is the instability of the areas on his foot which constantly generate draining abscesses. I will communicate with Dr. [name] about this. He has an appointment on 10/23 10/05/2018; patient sees Dr. [name] on 10/23. I have sent him a secure message not ordered any additional antibiotics for now. Patient continues to have 2 open areas on the left plantar foot at the fifth metatarsal head and the lateral aspect of the foot. These have not changed all that much. The area on the right third met head  still has thick callus and surrounding subcutaneous tissue we have been using silver alginate 10/12/2018; patient sees Dr. [name] or colleague on 10/23. 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 The area on the right plantar foot is just about closed. The superior wound on the left fifth metatarsal head is callused over but I am not sure if this is closed. The area below it is about the same. Considerable amount of callus on the lateral foot. We have been using silver alginate to the wounds 10/19/2018; the patient saw Dr. [name] on 10/22. He wishes to try and continue to save the left foot. He has been given 6 weeks of oral ciprofloxacin. We continue to put a total contact cast on the right foot. The area over the fifth metatarsal head on the left is callused over/perhaps healed but I did not remove the callus to find out. He still has the wound on the left lateral midfoot requiring debridement. We have been using silver alginate to all wound areas 10/26/2018 ooOn the left foot our intake nurse noted some purulent drainage from the inferior wound [currently the only one that is not callused over] ooOn the right foot even though he is in total contact cast a considerable amount of thick black callus and surface eschar. On this side we have been using silver alginate under a total contact cast. oohe remains on ciprofloxacin as prescribed by Dr. [name] 11/02/2018; culture from last week which is done of the probing area on the left midfoot wound showed MRSA "few". I am going to need to contact Dr. [name] which I will do today I think he is going to need IV antibiotics again. The area on the left foot which was callused on the side is clearly separating today all of this was removed a copious amounts of callus and necrotic subcutaneous tissue. ooThe area on the right plantar foot actually remained healthy looking with a healthy  granulated base. ooWe are using silver alginate to all wound areas 11/09/2018; unfortunately neither 1 of the patient's wounds areas looks at all satisfactory. On the left he has considerable necrotic debris from the plantar wound laterally over the foot. I removed copious amounts of material including callus skin and subcutaneous tissue. On the right foot he arrives with undermining and frankly purulent drainage. Specimen obtained for culture and debridement of the callused skin and subcutaneous tissue from around the circumference. Because of this I cannot put him back in a  total contact cast but to be truthful we are really unfortunately not making a lot of progress I did put in a secure text message to Dr. [name] wondering about the MRSA on the left foot. He suggested vancomycin although this is not started. I was left wondering if he expected me to send this into dialysis. Has we have more purulence on the right side wait for that result before calling dialysis 11/16/2018; I called dialysis earlier this week to get the vancomycin started. I think he got the first dose on Tuesday. The patient tells me that he fell earlier this week twisting his left foot and ankle and he is swelling. He continues to not look well. He had blood cultures done at dialysis on Tuesday he is not been informed of the results. 12/07/2018; the patient was admitted to hospital from 11/22/2018 through 12/02/2018. He underwent a left BKA. Apparently had staph sepsis. In the meantime being off the right foot this is closed over. 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 Subsequent to this he actually had multiple falls in fact he tells me to following which calls the wound to be his apparently. He has been seeing [name] and his physician assistant in the interim. They actually did want  him to come back for reevaluation to see if there's anything we can do for me wound care perspective the help this area to heal more appropriately. The patient states that he does not have a tremendous amount of pain which is good news. No fevers, chills, nausea, or vomiting noted at this time. We have gotten approval from [name] office had [place] for [place] to treat the patient for his stop wound. This is due to the fact that the patient was in the 90 day postop global. 1/21; the patient does not have an open wound on the right foot although he does have some pressure areas that will need to be padded when he is transferring. The dehisced surgical wound looks clean although there is some undermining of 1.5 cm superiorly. 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 This appears healthy but there is undermining. TheraSkin #1 2/11; TheraSkin #2 2/25; TheraSkin #3. Wound is measuring smaller 3/10; TheraSkin #4 wound is measuring smaller 3/24; TheraSkin #5 wound is measuring smaller and looks healthy. 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 He has no open area on the right foot although he does have some callused area over the original third metatarsal head wound. The third metatarsal is also subluxed on the right foot. I have warned him today that he cannot consider wearing a prosthesis for at least a month but he can go for measurements. He is going to need to keep the right foot padded is much as possible in his diabetic shoe indefinitely READMISSION 06/12/2019 Mr. Fuhrman is a type II diabetic on dialysis. He has been in this clinic multiple times with wounds on his bilateral lower extremities. 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 He also has a history of wounds on his right  foot but tells me that everything is going well here. He actually came  in here with his prosthesis walking with a cane. We were all really quite gratified to see this He states over the last several weeks he has had a painful area at the tip of the left third finger. His dialysis shunt in his is in the left upper arm and this particularly hurts during dialysis when his fingers get numb and there is a lot of pain in the tip of his left third finger. I believe he is seen vascular surgery. He had a Doppler done to evaluate for a dialysis steal syndrome. He is going for a banding procedure 1 week from today towards the left AV fistula. I think if that is unsuccessful they will be looking at creating a new shunt. He had a course of doxycycline when he which he finished about a week ago. He has been using Bactroban to the finger 6/25; the patient had his banding procedure and things seem to be going better. He is not having pain in his hand at dialysis. The area on the tip of his finger seems about closed. He did however have a paronychia I the last time I saw him. I have been advising him to use topical Bactroban washing the finger. Culture I did last time showed a few staph epidermidis which I was willing to dismiss as superficial skin contaminant. He arrives today with the finger wound looking better however the paronychia a seems worse 7/9; 2-week follow-up. He does not have an open wound on the tip of his third left finger. I thought he had an initial ischemic wound on the tip of the finger as well as a paronychia a medially. All of this appears to be closed. 8/6-Patient returns after 3 weeks for follow-up and has a right second met head plantar callus with a significant area of maceration hiding an ulcer ABI repeated today on right - 1.26 8/13-Patient returns at 1 week, the right second met head plantar wound appears to be slightly better, it is surrounded by the callus area we are using  silver alginate 8/20; the patient came back to clinic 2 weeks ago with a wound on the right second metatarsal head. He apparently told me he developed callus in this area but also went away from his custom shoes to a pair of running shoes. Using silver alginate. 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 There might be much we can do to offload this although the patient tells me he has a wheelchair that he is using to try and stay off the wound is much as possible 8/27; right second metatarsal head. Still small punched out area with thick callus and subcutaneous tissue around the wound. We have been using silver collagen. This is always been an issue with this man's wound especially on this foot 9/3; right second met head. Still the same punched-out area with thick callus and subcutaneous tissue around the wound removing the circumference demonstrates repetitively undermining area. I have removed all the subcutaneous tissue associated with this. We have been using silver collagen 9/15; right second metatarsal head. Same punched-out thick callus and subcutaneous tissue around the wound area. Still requiring debridement we have been using silver collagen I changed back to silver alginate X-ray last time showed no evidence of osteomyelitis. Culture showed Klebsiella and he was started and Keflex apparently just 4 days ago 9/24; right second metatarsal head. He is completed the antibiotics I gave him for 10 days. Same punched-out thick callus and  subcutaneous tissue around the area. Still requiring debridement. We have been using silver alginate. The area itself looks swollen and this could be just [place] that comes on this area with walking on a prosthesis on the left however I think he needs an MRI and I am going to order that today. There is not an option to offload this further 10/1; right second plantar metatarsal head. MRI booked for October 6. Generally looking better today  using silver alginate 10/8; right second plantar metatarsal head. MRI that was done on 10/6 was negative for osteomyelitis noted prior amputations of the second and fourth toes at the [facility]. Prior amputation of the third toe to the base of the middle phalanx. Prior amputation of the fifth toe to the head of the metatarsal. 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 He does not recall about hearing about this. He did not have any pain but then again he has reduced sensation from diabetic neuropathy 10/15; right second plantar metatarsal head. Still in the same condition callus nonviable subcutaneous tissue which cleans up nicely with debridement but reforms by the next week. The patient tells me that he is offloading this is much as he can including taking his wheelchair to dialysis. We have been using silver alginate, changed to silver collagen today 10/22; right second plantar metatarsal head. Wound measures smaller but still thick callus around this wound. Still requiring debridement 11/5 right second plantar metatarsal head. Less callus around the wound circumference but still requiring debridement. There is still some undermining which I cleaned out the wound looks healthy but still considerable punched out depth with a relatively small circumference to the wound there is no palpable bone no purulent drainage 11/12; right second plantar metatarsal head. Small wound with thick callus tissue around the wound and significant undermining. We have been using silver collagen after my continuous debridements of this area 11/19; patient arrives in today with purulent drainage coming out of the wound in the second third met head area on the right foot. Serosanguineous thick drainage. Specimen obtained for culture. 11/21/2019 on evaluation today patient appears to be doing well with regard to his foot ulcer. The good news is is culture came back negative for  any bacteria there was no growth. The other good news is his x-ray also appears to be doing well. The foot is also measuring much better than what it was. Overall I am very pleased with how things seem to be progressing. 12/22; patient was in [facility] for several weeks due to the death of a family member. He said he stayed off the wound using silver alginate. 01/03/2020. Patient has a small open area with roughly 0.3 cm of circumferential undermining. The orifice looks smaller. We have been using silver collagen. There is not a wound more aggressive way to offload this area as he has a prosthesis on the other leg 1/21; again the same small area is 2 weeks ago. We have been using silver collagen I change that to endoform today I really believe that this is an offloading issue 2/4; the area on the right third met head. We have been using endoform. 2/11 the area on the right third met head we have been using silver alginate. 2/25; the area on the right third met head. There is much less callus on this today. The patient states he is offloading this even more aggressively. As an example he is taking his wheelchair into  dialysis on most days 3/4; right third metatarsal head. Again he has eschar over the surface of the wound which I removed with a #3 curette. Some subcutaneous debris. This has 0.8 cm of direct probing depth. I cannot feel any bone here. I did do a culture the area 3/11; right third metatarsal head. Again an eschared area over the wound which almost makes this look superficial. Again debridement reveals a wound with probing depth probably not much different from last week there is no palpable bone no palpable purulence. The patient tells me that he is making every effort to offload this area properly. He is taking wheelchair into dialysis etc. There is no option for forefoot offloading or a total contact cast. We used Oasis #1 today 3/18; again callus and eschar over the wound  surface. I remove this but still a probing wound although only 3 mm in depth this time. This is an improvement 3/25; again callus and eschar over the wound surface which I removed the wound is much deeper today at 0.7 cm. There is no palpable bone. Because of the appearance of this a culture was done. I am not going to put the Oasis back in this. Concerned about underlying infection. Notable for the fact that he is just finished doxycycline for the last go round of this 3/30; still 0.7 cm in depth which is disappointing. Culture I gave last time showed a few Staph aureus which is methicillin susceptible. This should have been taken care of by the recent course of doxycycline I gave him. We are using silver alginate 4/6; almost 1 cm in depth. We have been using silver alginate with underlying Bactroban. 4/15; about 1 cm in depth still. There is no palpable bone but there is undermining thick surface again as usual. Our intake nurse noted what looked to be purulent drainage I suppose that could have been Bactroban but I did a culture for CandS 4/20; depth down 0.7 cm. Culture from last time was negative we have been using Bactroban and silver alginate 4/29; depth down 0.5 cm. Using silver alginate. He reports some drainage 5/6; comes in with the wound worse. Wound is deeper and tunnels. Measures 0.9 depth and an undermining area of 1.8 cm. 5/18; there is a change the dressing last week. Still using Sorbact hydrogel. What I can see of the base of the wound looks better. 6/1. Not much change. I changed him to endoform today. He has 1.1 cm in depth no palpable bone 6/8; again a small punched out wound with significant undermining. Culture I did last week was negative. T oday he had some purulent drainage perhaps mixed with some retained endoform. 6/22; MRI report below ooIMPRESSION: Skin ulceration on the plantar surface of the foot at the level of the head of the second metatarsal. Mild marrow edema  in the head of the second metatarsal could be reactive or due to osteomyelitis. Thin fluid collection surrounding the head and neck of the second metatarsal appears to communicate with the patient's skin wound and is worrisome for a septic collection. Advanced first MTP osteoarthritis. Prior amputations as noted above. Electronically Signed By: Inge Rise M.D. On: 06/16/2020 11:51 PCR culture I did last time I believe showed peptostreptococcus. There was no comment on any other gram-negative or gram-positive organisms. I gave him a course of Flagyl which I think he is finishing up. We are not making any progress with the wound over the right second/third metatarsal head. We are using silver  alginate putting him in kerlix Coban 6/29; has his appointment with [name] of orthopedics next Tuesday I think we see him on the same day. He was in for a nurse visit today. Nurse noted undermining of skin and callus so I was asked to look at this. We have been using silver alginate and kerlix Coban 7/6; patient saw Dr. [name] PA today will see [name] on Tuesday. The major question is does he needs surgery to clean out the area in question culture question resect any involved bone etc. Otherwise he will need to see infectious disease for broad-spectrum antibiotics given at dialysis 7/13; I talked to [name] late last week. He was not impressed with the MRI results thinking that the bone changes may have been reactive rather than osteomyelitis. He did not think he needed drainage of an abscess thinking of the fluid we are seeing was superficial and contiguous with the actual wound. He did not think he needed surgery or resection. Recommended consideration of infectious disease and return him to our care. We have been using silver alginate 7/20; PCR culture I did last week showed staph aureus methicillin sensitive. I have him on twice daily Keflex. Silver alginate to the wound 7/27; I have  continued his 500 every 12 of cephalexin with a second dose after dialysis. My intention is to give him 4 to 6 weeks of antibiotics directed at [facility] 07/31/2020 on evaluation today patient appears to be doing a little better in regard to his overall wound size. This is good news. He does have a fairly significant callus buildup still and I am can work on that today. Fortunately there is no signs of active infection at this time. He has been on Keflex and has an extension of that that he is going to pick up and continue taking. Objective Constitutional Well-nourished and well-hydrated in no acute distress. Vitals Time Taken: 3:21 PM, Height: 73 in, Weight: 202 lbs, BMI: 26.6, Temperature: 98.2 F, Pulse: 85 bpm, Respiratory Rate: 18 breaths/min, Blood Pressure: 102/64 mmHg, Capillary Blood Glucose: 144 mg/dl. Respiratory normal breathing without difficulty. Psychiatric this patient is able to make decisions and demonstrates good insight into disease process. Alert and Oriented x 3. pleasant and cooperative. General Notes: Upon inspection patient's wound bed actually showed signs of good granulation at this time there does not appear to be any evidence of active infection which is great news and overall very pleased with where things stand. There is no signs that the patient overall is showing any worsening and he is using his diabetic shoe at this point with his prosthetic on the left he really cannot use an offloading shoe though he tells me staying in his wheelchair most of the time. Integumentary (Hair, Skin) Wound #41 status is Open. Original cause of wound was Gradually Appeared. The wound is located on the Right Metatarsal head second. The wound measures 0.4cm length x 0.4cm width x 0.8cm depth; 0.126cm^2 area and 0.101cm^3 volume. There is Fat Layer (Subcutaneous Tissue) Exposed exposed. There is no tunneling noted, however, there is undermining starting at 12:00 and ending at 12:00 with a  maximum distance of 0.6cm. There is a small amount of serosanguineous drainage noted. The wound margin is thickened. There is large (67-100%) red, pink granulation within the wound bed. There is a small (1-33%) amount of necrotic tissue within the wound bed including Adherent Slough. 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 There was a Excisional Skin/Subcutaneous Tissue Debridement with a total area of 4 sq cm performed by [name] Keeler, PA. With the following instrument(s): Curette to remove Viable and Non-Viable tissue/material. Material removed includes Callus, Subcutaneous Tissue, and Slough. No specimens were taken. A time out was conducted at 15:40, prior to the start of the procedure. A Minimum amount of bleeding was controlled with Pressure. The procedure was tolerated well with a pain level of 0 throughout and a pain level of 0 following the procedure. Post Debridement Measurements: 0.4cm length x 0.4cm width x 0.8cm depth; 0.101cm^3 volume. Character of Wound/Ulcer Post Debridement is improved. Severity of Tissue Post Debridement is: Fat layer exposed. Post procedure Diagnosis Wound #41: Same as Pre-Procedure Plan Follow-up Appointments: Return Appointment in 1 week. Dressing Change Frequency: Wound #41 Right Metatarsal head second: Do not change entire dressing for one week. Skin Barriers/Peri-Wound Care: Moisturizing lotion Wound Cleansing: May shower with protection. - patient to use a cast protector Primary Wound Dressing: Wound #41 Right Metatarsal  head second: Calcium Alginate with Silver Secondary Dressing: Wound #41 Right Metatarsal head second: Dry Gauze Other: - felt callous pad Edema Control: Kerlix and Coban - Right Lower Extremity - cotton cast padding instead of kerlix Avoid standing for long periods of time Elevate legs to the level of the heart or above for 30 minutes daily and/or when sitting, a frequency of: - throughout the day Additional Orders / Instructions: Other: - minimize walking and standing on foot to aid in wound healing and callus buildup. 1 I would recommend currently that we continue with the silver alginate dressing at this point I think that still the best way to go. 2. I am also can recommend that the patient continue to be monitored for any signs of active infection at this point although I do not see anything today we will keep a close eye on this. 3. I also recommend we continue with the cotton padding along with Coban to secure in place he also is using a felt callus pad. We will see patient back for reevaluation in 1 week here in the clinic. If anything worsens or changes patient will contact our office for additional recommendations. Electronic Signature(s) Signed: 07/31/2020 3:51:58 PM By: [name] Keeler PA-C Previous Signature: 07/31/2020 3:50:47 PM Version By: [name] Keeler PA-C Entered By: [name] on 07/31/2020 15:51:58 -------------------------------------------------------------------------------- SuperBill Details Patient Name: Date of Service: [name] 07/31/2020 Medical Record Number: [id number] Patient Account Number: [id number] Date of Birth/Sex: Treating RN: [birth year] (69 y.o. [name] Primary Care Provider: Kristie [name] Other Clinician: Referring Provider: Treating Provider/Extender: [name] in Treatment: 59 Diagnosis Coding ICD-10 Codes Code Description E11.621 Type 2 diabetes mellitus with foot ulcer L97.512  Non-pressure chronic ulcer of other part of right foot with fat layer exposed I70.298 Other atherosclerosis of native arteries of extremities, other extremity L03.115 Cellulitis of right lower limb Facility Procedures The patient participates with Medicare or their insurance follows the Medicare Facility Guidelines: CPT4 Code Description Modifier Quantity [id number] 11042 - DEB SUBQ TISSUE 20 SQ CM/< 1 ICD-10 Diagnosis Description L97.512 Non-pressure chronic ulcer of  other part of  right foot with fat layer exposed Physician Procedures : CPT4 Code Description Modifier [id number] - WC PHYS SUBQ TISS 20 SQ CM ICD-10 Diagnosis Description L97.512 Non-pressure chronic ulcer of other part of right foot with fat layer exposed Quantity: 1 Electronic Signature(s) Signed: 07/31/2020 3:52:17 PM By: [name] Keeler PA-C Entered By: [name] on 07/31/2020 15:52:16

## 2020-08-05 NOTE — Progress Notes (Signed)
[name] ([id number]) Visit Report for 07/31/2020 Arrival Information Details Patient Name: Date of Service: [name], DIFRANCESCO 07/31/2020 2:30 PM Medical Record Number: [id number] Patient Account Number: [id number] Date of Birth/Sex: Treating RN: 04-24-1951 (69 y.o. [name], [name] Primary Care Bentleigh Stankus: [name] Other Clinician: Referring Neira Bentsen: Treating Mekenna Finau/Extender: Arbutus Ped in Treatment: 45 Visit Information History Since Last Visit Added or deleted any medications: No Patient Arrived: Wheel Chair Any new allergies or adverse reactions: No Arrival Time: 15:19 Had a fall or experienced change in No Accompanied By: self activities of daily living that may affect Transfer Assistance: None risk of falls: Patient Identification Verified: Yes Signs or symptoms of abuse/neglect since last visito No Secondary Verification Process Completed: Yes Hospitalized since last visit: No Patient Requires Transmission-Based Precautions: No Implantable device outside of the clinic excluding No Patient Has Alerts: No cellular tissue based products placed in the center since last visit: Has Dressing in Place as Prescribed: Yes Pain Present Now: No Electronic Signature(s) Signed: 08/05/2020 10:17:44 AM By: Sandre Kitty Entered By: Sandre Kitty on 07/31/2020 15:20:47 -------------------------------------------------------------------------------- Encounter Discharge Information Details Patient Name: Date of Service: [name]. 07/31/2020 2:30 PM Medical Record Number: [id number] Patient Account Number: [id number] Date of Birth/Sex: Treating RN: Dec 28, 1950 (69 y.o. [name] Primary Care Carnelia Oscar: [name] Other Clinician: Referring Klaus Casteneda: Treating Tullio Chausse/Extender: Arbutus Ped in Treatment: 58 Encounter Discharge Information Items Post Procedure Vitals Discharge Condition:  Stable Temperature (F): 98.2 Ambulatory Status: Wheelchair Pulse (bpm): 85 Discharge Destination: Home Respiratory Rate (breaths/min): 18 Transportation: Private Auto Blood Pressure (mmHg): 102/64 Accompanied By: self Schedule Follow-up Appointment: Yes Clinical Summary of Care: Patient Declined Electronic Signature(s) Signed: 07/31/2020 5:04:18 PM By: Baruch Gouty RN, BSN Entered By: Baruch Gouty on 07/31/2020 16:07:13 -------------------------------------------------------------------------------- Lower Extremity Assessment Details Patient Name: Date of Service: [name] 07/31/2020 2:30 PM Medical Record Number: [id number] Patient Account Number: [id number] Date of Birth/Sex: Treating RN: [birth year] (69 y.o. [name] Primary Care Lauralynn Loeb: [name] Other Clinician: Referring Raeleigh Guinn: Treating Zane Pellecchia/Extender: Arbutus Ped in Treatment: 59 Edema Assessment Assessed: [name]: No] Patrice Paradise: No] Edema: [Left: N] [Right: o] Calf Left: Right: Point of Measurement: cm From Medial Instep cm 33 cm Ankle Left: Right: Point of Measurement: cm From Medial Instep cm 21.5 cm Vascular Assessment Pulses: Dorsalis Pedis Palpable: [Right:Yes] Electronic Signature(s) Signed: 07/31/2020 5:04:18 PM By: Baruch Gouty RN, BSN Entered By: Baruch Gouty on 07/31/2020 15:28:34 -------------------------------------------------------------------------------- Multi-Disciplinary Care Plan Details Patient Name: Date of Service: [name]. 07/31/2020 2:30 PM Medical Record Number: [id number] Patient Account Number: [id number] Date of Birth/Sex: Treating RN: July 17, 1951 (69 y.o. [name] Primary Care Jashun Puertas: [name] Other Clinician: Referring Benoit Meech: Treating Danylah Holden/Extender: Arbutus Ped in Treatment: 80 Active Inactive Wound/Skin Impairment Nursing Diagnoses: Knowledge deficit related  to ulceration/compromised skin integrity Goals: Patient/caregiver will verbalize understanding of skin care regimen Date Initiated: 06/12/2019 Target Resolution Date: 08/22/2020 Goal Status: Active Ulcer/skin breakdown will have a volume reduction of 30% by week 4 Date Initiated: 06/12/2019 Date Inactivated: 07/05/2019 Target Resolution Date: 07/13/2019 Goal Status: Met Interventions: Assess patient/caregiver ability to obtain necessary supplies Assess patient/caregiver ability to perform ulcer/skin care regimen upon admission and as needed Assess ulceration(s) every visit Notes: Electronic Signature(s) Signed: 07/31/2020 5:21:32 PM By: Levan Hurst RN, BSN Entered By: Levan Hurst on 07/31/2020 15:40:43 -------------------------------------------------------------------------------- Pain Assessment Details Patient Name: Date of Service: [name] Hones EL  B. 07/31/2020 2:30 PM Medical Record Number: [id number] Patient Account Number: [id number] Date of Birth/Sex: Treating RN: December 10, 1951 (69 y.o. [name] Primary Care Raychell Holcomb: [name] Other Clinician: Referring Havanah Nelms: Treating Merlyn Bollen/Extender: Arbutus Ped in Treatment: 61 Active Problems Location of Pain Severity and Description of Pain Patient Has Paino No Site Locations Pain Management and Medication Current Pain Management: Electronic Signature(s) Signed: 07/31/2020 5:21:32 PM By: Levan Hurst RN, BSN Signed: 08/05/2020 10:17:44 AM By: Sandre Kitty Entered By: Sandre Kitty on 07/31/2020 15:21:22 -------------------------------------------------------------------------------- Patient/Caregiver Education Details Patient Name: Date of Service: [name] 8/5/2021andnbsp2:30 PM Medical Record Number: [id number] Patient Account Number: [id number] Date of Birth/Gender: Treating RN: 07-28-1951 (69 y.o. [name] Primary Care Physician: [name] Other  Clinician: Referring Physician: Treating Physician/Extender: Arbutus Ped in Treatment: 68 Education Assessment Education Provided To: Patient Education Topics Provided Wound/Skin Impairment: Methods: Explain/Verbal Responses: State content correctly [employer]) Signed: 07/31/2020 5:21:32 PM By: Levan Hurst RN, BSN Entered By: Levan Hurst on 07/31/2020 15:40:59 -------------------------------------------------------------------------------- Wound Assessment Details Patient Name: Date of Service: [name] 07/31/2020 2:30 PM Medical Record Number: [id number] Patient Account Number: [id number] Date of Birth/Sex: Treating RN: 10-Jan-1951 (69 y.o. [name] Primary Care Eyleen Rawlinson: [name] Other Clinician: Referring Anvitha Hutmacher: Treating Otho Michalik/Extender: Arbutus Ped in Treatment: 59 Wound Status Wound Number: 41 Primary Diabetic Wound/Ulcer of the Lower Extremity Etiology: Wound Location: Right Metatarsal head second Wound Open Wounding Event: Gradually Appeared Status: Date Acquired: 07/23/2019 Comorbid Cataracts, Anemia, Arrhythmia, Congestive Heart Failure, Weeks Of Treatment: 52 History: Hypertension, Peripheral Arterial Disease, Type II Diabetes, End Clustered Wound: No Stage Renal Disease, History of Burn, Osteoarthritis, Osteomyelitis, Neuropathy Photos Photo Uploaded By: Mikeal Hawthorne on 08/04/2020 15:20:16 Wound Measurements Length: (cm) 0.4 Width: (cm) 0.4 Depth: (cm) 0.8 Area: (cm) 0.126 Volume: (cm) 0.101 % Reduction in Area: 92.9% % Reduction in Volume: 80.9% Epithelialization: None Tunneling: No Undermining: Yes Starting Position (o'clock): 12 Ending Position (o'clock): 12 Maximum Distance: (cm) 0.6 Wound Description Classification: Grade 2 Wound Margin: Thickened Exudate Amount: Small Exudate Type: Serosanguineous Exudate Color: red, brown Foul Odor After  Cleansing: No Slough/Fibrino Yes Wound Bed Granulation Amount: Large (67-100%) Exposed Structure Granulation Quality: Red, Pink Fascia Exposed: No Necrotic Amount: Small (1-33%) Fat Layer (Subcutaneous Tissue) Exposed: Yes Necrotic Quality: Adherent Slough Tendon Exposed: No Muscle Exposed: No Joint Exposed: No Bone Exposed: No Treatment Notes Wound #41 (Right Metatarsal head second) 2. Periwound Care Moisturizing lotion 3. Primary Dressing Applied Calcium Alginate Ag 4. Secondary Dressing Dry Gauze 6. Support Layer Applied Kerlix/Coban 7. Footwear/Offloading device applied Felt/Foam Notes felt, cotton instead of kerlix Electronic Signature(s) Signed: 07/31/2020 5:04:18 PM By: Baruch Gouty RN, BSN Entered By: Baruch Gouty on 07/31/2020 15:28:04 -------------------------------------------------------------------------------- [place] Details Patient Name: Date of Service: [name] 07/31/2020 2:30 PM Medical Record Number: [id number] Patient Account Number: [id number] Date of Birth/Sex: Treating RN: 1951/07/18 (69 y.o. [name] Primary Care Skyra Crichlow: [name] Other Clinician: Referring Axzel Rockhill: Treating Ashlee Bewley/Extender: Arbutus Ped in Treatment: 59 Vital Signs Time Taken: 15:21 Temperature (F): 98.2 Height (in): 73 Pulse (bpm): 85 Weight (lbs): 202 Respiratory Rate (breaths/min): 18 Body Mass Index (BMI): 26.6 Blood Pressure (mmHg): 102/64 Capillary Blood Glucose (mg/dl): 144 Reference Range: 80 - 120 mg / dl Electronic Signature(s) Signed: 08/05/2020 10:17:44 AM By: Sandre Kitty Entered By: Sandre Kitty on 07/31/2020 15:21:09

## 2020-08-07 ENCOUNTER — Encounter (HOSPITAL_BASED_OUTPATIENT_CLINIC_OR_DEPARTMENT_OTHER): Payer: Medicare Other | Admitting: Internal Medicine

## 2020-08-07 DIAGNOSIS — E11621 Type 2 diabetes mellitus with foot ulcer: Secondary | ICD-10-CM | POA: Diagnosis not present
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
## 2020-08-08 NOTE — Progress Notes (Signed)
[name], [name] ([id number]) Visit Report for 08/07/2020 HPI Details Patient Name: Date of Service: [name], [name] 08/07/2020 1:30 PM Medical Record Number: [id number] Patient Account Number: [id number] Date of Birth/Sex: Treating RN: [birth year] (69 y.o. [name] Primary Care Provider: [name] Other Clinician: Referring Provider: Treating Provider/Extender: Raiford Noble in Treatment: 60 History of Present Illness Location: Patient presents with a wound to bilateral feet. Quality: Patient reports experiencing essentially no pain. Severity: Mildly severe wound with no evidence of infection Duration: Patient has had the wound for greater than 2 weeks prior to presenting for treatment HPI Description: Stomatitis cyst the patient is a pleasant 69 yrs old bm here for evaluation of ulcers on the plantar aspect of both feet. 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 He has a left arm graft for access. He has been trying to stay off his feet for weeks but does not seem to have any improvement in the wound. He has been seeing someone at the foot center and was referred to the Wound Care center for further evaluation. 12/30/15 the patient has 3 wounds one over the right first metatarsal head and 2 on the left foot at the left fifth and lleft first metatarsal head. All of these look relatively similar. The one over the left fifth probe to bone I could not prove that any of the others did. I note his MRI in November that did not show osteomyelitis. His peripheral pulses seem robust. All of these underwent surgical debridement to remove callus nonviable skin and subcutaneous tissue 01/06/16; the patient had his sutures removed from the right fifth ray amputation. There may be a small open part of this superiorly but otherwise the incision looks good. Areas over his right first, left first and left fifth metatarsal  head all underwent surgical debridement as a varying degree of callus, skin and nonviable subcutaneous tissue. The area that is most worrisome is the right fifth metatarsal head which has a wound probes precariously close to bone. There is no purulent drainage or erythema 01/13/16; I'm not exactly sure of the status of the right fifth ray amputation site however he follows with Dr. [name] later this week. The area over his right first plantar metatarsal head, left first and fifth plantar metatarsal head are all in the same status. Thick circumferential callus, nonviable subcutaneous tissue. Culture of the left fifth did not culture last week 01/20/16; all of the patient's wounds appear and roughly the same state although his amputation site on the right lateral foot looks better. His wounds over the right first, left first and left fifth metatarsal heads all underwent difficult surgical debridement removing circumferential callus nonviable skin and subcutaneous tissue. There is no overt evidence of infection in these areas. MRI at the end of December of the left foot did not show osteomyelitis, right foot showed osteomyelitis of the right fifth digit he is now status post amputation. 01/27/16 the patient's wounds over his plantar first and fifth metatarsal heads on the left all appear better having been started on a total contact cast last week. They were debridement of circumferential callus and nonviable subcutaneous tissue as was the wound over the first metatarsal head. His surgical incision on the right also had some light surface debridement done. 03/23/2016 -- the patient was doing really well and most of his wounds had almost completely healed but now he came back today with a history  of having a discharge from the area of his right foot on the plantar aspect and also between his first and second toe. He also has had some discharge from the left foot. Addendum: I spoke to the PA [name] at the dialysis center whose fax number is [phone]. We discussed the infection the patient has and she will put the patient on vancomycin and Fortaz until the final culture report is back. We will fax this as soon as available. 03/30/2016 -- left foot x-ray IMPRESSION:No definitive osteomyelitis noted. X-ray of the right foot -- IMPRESSION: 1. Soft tissue swelling. Prior amputation right fifth digit. No acute or focal bony abnormality identified. If osteomyelitis remains a clinical concern, MRI can be obtained. 2. Peripheral vascular disease. His culture reports have grown an MSSA -- and we will fax this report to his hemodialysis center. He was called in a prescription of oral doxycycline but I have told her not to fill this in as he is already on IV antibiotics. 04/06/2016 -- a few days ago, I spoke to the hemodialysis center nurse who had stopped the IV antibiotics and he was given a prescription for doxycycline 100 mg by mouth twice a day for a week and he is on this at the present time. 05/11/2016 -- he has recently seen his PCP this week and his hemoglobin A1c was 7. He is working on his paperwork to get his orthotic shoes. 05/25/2016 -- -- x-ray of the left foot IMPRESSION:No acute bony abnormality. No radiographic changes of acute osteomyelitis. No change since prior study. X-ray of the right foot -- IMPRESSION: Postsurgical changes are seen involving the fifth toe. No evidence of acute osteomyelitis. he developed a large blister on the medial part of his right foot and this opened out and drain fluid. 06/01/2016 -- he has his MRI to be done this afternoon. 06/15/2016 -- MRI of the left forefoot without contrast shows 2 separate regions of cutaneous and subcutaneous edema and possible ulceration and blistering along the ball of the foot. No obvious osteomyelitis identified. MRI of the right foot showed cutaneous and subcutaneous thickening plantar to the first digit sesamoid  with an ulcer crater but no underlying osteomyelitis is identified. 06/22/2016 -- the right foot plantar ulcer has been draining a lot of seropurulent material for the last few days. 06/30/2016 -- spoke to the dialysis center and I believe I spoke to [place] a PA at the center who discussed with me and agreed to putting Legrand Como on vancomycin until his cultures arrive. On review of his culture report no WBCs were seen or no organisms were seen and the culture was reincubated for better growth. The final report is back and there were no predominant growth including Streptococcus or Staphylococcus. Clinically though he has a lot of drainage from both wounds a lot of undermining and there is further blebs on the left foot towards the interspace between his first and second toe. 08/10/2016 -- the culture from the right foot showed normal skin flora and there was no Staphylococcus aureus was group A streptococcus isolated. 09/21/2016 -- -- MRI of the right foot was done on 09/13/2016 - IMPRESSION: Findings most consistent with acute osteomyelitis throughout the great toe, sesamoid bones and plantar aspect of the head of the first metatarsal. Fluid in the sheath of the flexor tendon of the great toe could be sympathetic but is worrisome for septic tenosynovitis. First MTP joint effusion worrisome for septic joint. He was admitted to the hospital  on 09/11/2016 and treated for a fever with vancomycin and cefepime. He was seen by Dr. Johny Sax of infectious disease who recommended 6 weeks treatment with vancomycin and ceftazidime with his hemodialysis and to continue to see as in the wound clinic. Vascular consult was pending. The patient was discharged home on 09/14/2016 and he would continue with IV antibiotics for 6 weeks. 09/28/16 wound appears reasonably healthy. Continuing with total contact cast 10/05/16 wound is smaller and looking healthy. Continue with total contact cast. He continues on IV  antibiotics 10/12/2016 -- he has developed a new wound on the dorsal aspect of his left big toe and this is a superficial injury with no surrounding cellulitis. He has completed 30 days of IV antibiotics and is now ready to start his hyperbaric oxygen therapy as per his insurance company's recommendation. 10/19/2016 -- after the cast was removed on the right side he has got good resolution of his ulceration on the right plantar foot. he has a new wound on the left plantar foot in the region of his fourth metatarsal and this will need sharp debridement. 10/26/2016 -- Xray of the right foot complete: IMPRESSION: Changes consistent with osteomyelitis involving the head of the first metatarsal and base of the first proximal phalanx. The sesamoid bones are also likely involved given their positioning. 11/02/2016 -- there still awaiting insurance clearance for his hyperbaric oxygen therapy and hopefully he will begin treatment soon. 11/09/2016 -- started with hyperbaric oxygen therapy and had some barotrauma to the right ear and this was seen by ENT who was prescribed Afrin drops and would probably continue with HBO and he is scheduled for myringotomy tubes on Friday 11/16/2016 -- he had his myringotomy tubes placed on Friday and has been doing much better after that with some fluid draining out after hyperbaric treatment today. The pain was much better. 11/30/2016 -- over the last 2 days he noticed a swelling and change of color of his right second toe and this had been draining minimal fluid. 12/07/2016 -- x-ray of the right foot -- IMPRESSION:1. Progressive ulceration at the distal aspect of the second digit with significant soft tissue swelling and osseous changes in the distal phalanx compatible with osteomyelitis. 2. Chronic osteomyelitis at the first MTP joint. 3. Ulcerations at the second and third toes as well without definite osseous changes. Osteomyelitis is not excluded. 4. Fifth digit  amputation. On 12/01/2016 I spoke to Dr. Johny Sax, the infectious disease specialist, who kindly agreed to treat this with IV antibiotics and he would call in the order to the dialysis center and this has been discussed in detail with the patient who will make the appropriate arrangements. The patient will also book an appointment as soon as possible to see Dr. Ninetta Lights in the office. Of note the patient has not been on antibiotics this entire week as the dialysis center did not receive any orders from Dr. Ninetta Lights. I got in touch with Dr. Ninetta Lights who tells me the patient has an appointment to see him this coming Wednesday. 12/14/2016 -- the patient is on ceftazidime and vancomycin during his dialysis, and I understand this was put on by his nephrologist Dr. Kirtland Bouchard possibly after speaking with infectious disease Dr. Ninetta Lights 12/23/16; patient was on my schedule today for a wound evaluation as he had difficulties with his schedule earlier this week. I note that he is on vancomycin and ceftazidine at dialysis. In spite of this he arrives today with a new wound on the base  of the right second toe this easily probes to bone. The known wound at the tip of the second toe With this area. He also has a superficial area on the medial aspect of the third toe on the side of the DIP. This does not appear to have much depth. The area on the plantar left foot is a deep area but did not probe the bone 12/28/16 -- he has brought in some lab work and the most recent labs done showed a hemoglobin of 12.1 hematocrit of 36.3, neutrophils of 51% WBC count of 5.6, BN of 53, albumin of 4.1 globulin of 3.7, vancomycin 13 g per mL. 01/04/2017 -- he saw Dr. [name] who has recommended a amputation of the right second toe and he is awaiting this date. He sees Dr. Bobby Rumpf of infectious disease tomorrow. The bone culture taken on 12/28/2016 had no growth in 2 days. 01/11/2017 -- was seen by Dr. Bobby Rumpf regarding the  management and has recommended a eval by vascular surgeons. He recommended to continue the antibiotics during his hemodialysis. Xray of the left foot -- IMPRESSION: No acute fracture, dislocation, or osseous erosion identified. 01/18/2017 -- he has his vascular workup later today and he is going to have his right foot second toe amputation this coming Friday by Dr. [name]. We have also put in for a another 30 treatments with hyperbaric oxygen therapy 01/25/2017 -- I have reviewed Dr. Donnetta Hutching is vascular report from last week where he reviewed him and thought his vascular function was good enough to heal his amputation site and no further tests were recommended. He also had his orthopedic related to surgery which is still pending the notes and we will review these next week. 02/01/2017 -- the operative remote of Dr. 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 02/08/2017 --he was seen by Dr. [name] on 02/03/2017 -- patient is on [place]. after a thorough review he had recommended to continue with antibiotics during hemodialysis. The patient was seen by Dr. [name], but I do not find any follow-up note on the electronic medical record. he has removed the dressing over the right foot to remove the sutures and asked him to see him back in a month's time 02/22/2017 -- patient has been febrile and has been having symptoms of the upper respiratory tract infection but has not been checked for the flu and it's been over 4 days now. He says he is feeling better today. Some drainage between his left first and second toe and this needed to be looked  at 03/08/2017 -- was seen by Dr. Bobby Rumpf on 03/07/2017 after review he will stop his antibiotics and see him back in a month to see if he is feeling well. 03/15/2017 -- he has completed his course of hyperbaric oxygen therapy and is doing fine with his health otherwise. 03/22/2017 -- his nutritionist at the dialysis center has recommended a protein supplement to help build his collagen and we will prescribe this for him when he has the details 05/03/2017 -- he recently noticed the area on the plantar aspect of his fifth metatarsal head which opened out and had minimal drainage. 05/10/2017 -- --  x-ray of the left foot -- IMPRESSION:1. No convincing conventional radiographic evidence of active osteomyelitis.2. Active soft tissue ulceration at the tip of the fifth digit, and at the lateral aspect of the foot adjacent the base of the fifth metatarsal. 3. Surgical changes of prior fourth toe amputation. 4. 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 5. Small vessel atherosclerotic vascular calcifications. 6. 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 ======= Readmission after 5 weeks: 06/15/2017 -- the patient returns after 5 weeks having had an MRI done on 05/17/2017 MRI of the left foot without contrast showed soft tissue ulcer overlying the base of the fifth metatarsal. 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 No drainable fluid collection to suggest an abscess. He was in hospital between 05/16/2017 and 05/25/2017 -- and on discharge was asked to follow-up with Dr. Luiz Blare and Dr. Ninetta Lights. He was treated 2 weeks post discharge with vancomycin plus ceftriaxone with hemodialysis and oral metronidazole 500 mg 3 times a day. 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 last hemoglobin A1c was  6.7% He had a postoperative MR of the foot on 05/23/2017 -- which showed no new areas of cortical bone loss, edema or soft tissue ulceration to suggest osteomyelitis. the patient has completed his course of IV antibiotics during dialysis and is to see the infectious disease doctor tomorrow. Dr. Luiz Blare had seen him after suture removal and asked him to keep the wound with a dry dressing 06/21/2017 -- he was seen by Dr. Johny Sax of infectious disease on 06/16/2017 -- he stopped his Flagyl and will see him back in 6 weeks. He was asked to follow-up with Dr. Luiz Blare and with the wound clinic clinic. 07/05/17; bone biopsy from last time showed acute osteomyelitis. This is from the reminiscent in the left fifth metatarsal. Culture result is apparently still pending [holding for anaerobe}. From my understanding in this case this is been a progressive necrotic wound which is deteriorated markedly over the last 3 weeks since he returned here. He now has a large area of exposed bone which was biopsied and cultured last week. Dr. Ilsa Iha has put him on vancomycin and Fortaz during his hemodialysis and Flagyl orally. He is to see Dr. Luiz Blare next week 07/19/2017 -- the patient was reviewed by Dr. Luiz Blare of orthopedics who reviewed the case in detail and agreed with the plan to continue with IV antibiotics, aggressive wound care and hyperbaric oxygen therapy. He would see him back in 3 weeks' time 08/09/2017 -- saw Dr. Johny Sax on 08/03/2017 -he was restarted on his antibiotics for 6 weeks which included vanco, ceftaz and Flagyl. he recommended continuing his antibiotics for 6 weeks and reevaluate his completion date. He would continue with wound care and hyperbaric oxygen therapy. 08/16/2017 -- I understand he will be completing his 6 weeks of antibiotics sometime later this week. 09/19/2017 -- he has been without his wound VAC for the last week due to lack of supplies. He has been packing his wound  with silver alginate. 10/04/2017 -- he has an appointment with Dr. Ninetta Lights tomorrow and hence we will not apply the wound VAC after his dressing changes today. 10/11/2017 -- he was seen by Dr. Johny Sax on 10/05/2017, noted that the patient was currently off antibiotics, and after thorough review he recommended he follow-up with the wound  care as per plan and no further antibiotics at this point. 11/29/17; patient is arrived for the wound on his left lateral foot.Marland Kitchen He is completed IV antibiotics and 2 rounds of hyperbaric oxygen for treatment of underlying osteomyelitis. He arrives today with a surface on most of the wound area however our intake nurse noted drainage from the superior aspect. This was brought to my attention. 12/13/2017 -- the right plantar foot had a large bleb and once the bleb was opened out a large callus and subcutaneous debris was removed and he has a plantar ulcer near the fourth metatarsal head 12/28/17 on evaluation today patient appears to be doing acutely worse in regard to his left foot. The wound which has been appearing to do better as now open up more deeply there is bone palpable at the base of the wound unfortunately. He tells me that this feels "like it did when he had osteomyelitis previously" he also noted that his second toe on the left foot appears to be doing worse and is swollen there does appear to be some fluid collected underneath. His right foot plantar ulcer appears to be doing somewhat better at this point and there really is no complication at the site currently. No fevers, chills, nausea, or vomiting noted at this time. Patient states that he normally has no pain at this site however. T oday he is having significant pain. 01/03/18; culture done last week showed methicillin sensitive staph aureus and group B strep. He was on Septra however he arrives today with a fever of 101. He has a functional dialysis shunt in his left arm but has had no pain here.  No cough. He still makes some urine no dysuria he did have abdominal pain nausea and diarrhea over the weekend but is not had any diarrhea since yesterday. Otherwise he has no specific complaints 01/05/18; the patient returns today in follow-up for his presentation from 1/8. At that point he was febrile. I gave him some Levaquin adjusted for his dialysis status. He tells me the fever broke that night and it is not likely that this was actually a wound infection. He is gone on to have an MRI of the left foot; this showed interval development of an abnormal signal from the base of the third and fourth metatarsal and in the cuboid reminiscent consistent with osteomyelitis. There is no mention of the left second toe I think which was a concern when it was ordered. The patient is taking his Levaquin 01/10/18; the patient has completed his antibiotics today. The area on the left lateral foot is smaller but it still probes easily to bone. He has underlying osteomyelitis here and sees Dr. Megan Salon of infectious disease this week The area on the right third plantar metatarsal head still requires debridement not much change in dimensions He has a new wound on the medial tip of his right third toe again this probes to bone. He only noticed this 2 days ago 01/17/18; the patient saw Dr. [name] last week and he is back on Vanco and Fortaz at dialysis. 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 We are previously seeing him for a wound also on the base of the fourth med head on the right. X-ray of this area did not show osteomyelitis in relation to a new wound on the tip of his right third toe. Again  this probes to bone. Finally his second toe on the left which didn't really show anything on the MRI at least no report appears to have a separated cutaneous area which I think is going to come right off the tip  of his toe and leave exposed bone. Whether this is infectious or ischemic I am not clear Culture I did from the third toe last week which was his new wound was negative. Plain x-ray on the right foot did not show osteomyelitis in the toes 01/24/18; the patient is going went to see Dr. [name]. He is going to have a amputation of the left second and right third toe although paradoxically the left second toe looks better than last week. We have treating a deep probing wound on the left lateral foot and the area over the third metatarsal head on the right foot. 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 Also a debridement including bone of the left lateral foot and a closure which is still sutured. This is a bit surprising. Also apparent debridement of the base of the right third toe wound. 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 I'm not sure exactly where we are in the frame of things here. The wound on the left lateral foot is still sutured also the amputation sites of the left second and right third toe. He is been using Xeroform to the sutured areas silver alginate to the right foot 02/14/18; he continues on Rocephin at dialysis for osteomyelitis. I still don't have a good sense of where we are in the treatment duration. The wounds on the left lateral foot had the sutures removed and this clearly still probes deeply. We debrided the area with a #5 curet. We'll use silver alginate to the wound the area over the right third met head also required debridement of callus skin and subcutaneous tissue and necrotic debris over the wound surface. This tunnels superiorly but I did not unroofed this today. The areas on the tip of his toes have a crusted surface eschar I did not  debridement this either. 02/21/18; he continues on Rocephin at dialysis for osteomyelitis. I don't have a good sense of where we are and the treatment duration. The wounds on the left lateral foot is callused over on presentation but requires debridement. The area on the right third med head plantar aspect actually is measuring smaller 02/28/18;patient is on Vanco and Fortaz at dialysis, I'm not sure why I thought this was Rocephin above unless it is been changed. I don't have a good sense of time frame here. Using silver collagen to the area on the left lateral foot and right third med head plantar foot 03/07/18; patient is completing bank and Fortaz at dialysis soon. He is using Silver collagen to the area on the left lateral foot and the right third metatarsal head. He has a smaller superficial wound distal to the left lateral foot wound. 03/14/18-he is here in follow-up evaluation for multiple ulcerations to his bilateral feet. He presents with a new ulceration to the plantar aspect of the left foot underneath the blister, this was deroofed to reveal a partial thickness ulcer. He is voicing no complaints or concerns, tolerated dialysis yesterday. We will continue with same treatment plan and  he will follow-up next week 03/21/18; the patient has a area on the lateral left foot which still has a small probing area. The overall surface area of the wound is better. He presented with a new ulceration on the left foot plantar fifth metatarsal head last week. The area on the right third metatarsal head appears smaller.using silver alginate to all wound areas. The patient is changing the dressing himself. He is using a Darco forefoot offloading are on the right and a healing sandal on the left 03/28/18; original wound left lateral foot. Still nowhere close to looking like a heeling surface secondary wound on the left lateral foot at the base of his question fifth metatarsal which was new last week Right third  metatarsal head. We have been using silver alginate all wounds 04/04/18; the original wound on the left lateral foot again heavy callus surrounding thick nonviable subcutaneous tissue all requiring debridement. The new wound from 2 weeks ago just near this at the base of the fifth metatarsal head still looks about the same Unfortunately there is been deterioration in the third medical head wound on the right which now probes to bone. I must say this was a superficial wound at one point in time and I really don't have a good frame of reference year. I'll have to go back to look through records about what we know about the right foot. 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 He is also been to see Dr. [name]. Somewhere in here as somebody is ordered a VASCULAR evaluation which was done on 03/22/18. On the right is anterior tibial artery was monophasic triphasic at the posterior tibial artery. On the left anterior tibial artery monophasic posterior tibial artery monophasic. ABI in the right was 1.43 on the left 1.21. TBIs on the right at 0.41 and on the left at 0.32. A vascular consult was recommended and I think has been arranged. 04/11/18; Mr. Sawchuk has not had an MRI of the right foot since 2017. Recent x-ray of the right foot done in January and February was negative. However he has had a major deterioration in the wound over the third met head. He is completed antibiotics last week at dialysis [place]. I think he is going to see vascular in follow-up. The areas on the left lateral foot and left plantar fifth metatarsal head both look satisfactory. I debrided both of these areas although the tissue here looks good. The area on the left lateral foot once probe to bone it certainly does not do that now 04/18/18; MRI of the right foot is on Thursday. He has what looks to be serosanguineous purulent  drainage coming out of the wound over the right third metatarsal head today. He completed antibiotics bank and Fortaz 2 weeks ago at dialysis. He has a vascular follow-up with regards to his arterial insufficiency although I don't exactly see when that is booked. The areas on the left foot including the lateral left foot and the plantar left fifth metatarsal head look about the same. 04/25/18-He is here in follow-up evaluation for bilateral foot ulcers. MRI obtained was negative for osteomyelitis. Wound culture was negative. We will continue with same treatment plan he'll follow-up next week 05/02/18; the right plantar foot wound over the third metatarsal head actually looks better than when I last saw this. His MRI was negative for osteomyelitis wound culture was negative. On the  left plantar foot both wounds on the plantar fifth metatarsal head and on the lateral foot both are covered in a very hard circumferential callus. 05/09/18; right plantar foot wound over the third metatarsal head stable from last week. Left plantar foot wound over the fifth metatarsal head also stable but with callus around both wound areas The area on the left lateral foot had thick callus over a surface and I had some thoughts about leaving this intact however it felt boggy. We've been using silver alginate all wounds 05/16/18; since the patient was last here he was hospitalized from 5/15 through 5/19. 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 This grew MRSA. He was placed on vancomycin.MRI of the left foot suggested "progressive" osteomyelitis and bone destruction of the cuboid and the base of the fifth metatarsal. Stable erosive changes of the base of the second metatarsal. I'm wondering if they're aware that he had surgery and debridement in the area of the underlying bone previously by Dr. [name]. In any case, He was  also revascularized with an angioplasty and stenting of the left tibial peroneal trunk and angioplasty of the left posterior tibial artery. He is on vancomycin at dialysis. He has a 2 week follow-up with infectious disease. He has vascular surgery follow-up. He was started on Plavix. 05/23/18; the patient's wound on the lateral left foot at the level of the fifth metatarsal head and the plantar wound on the plantar fifth metatarsal head both look better. The area on the right third metatarsal head still has depth and undermining. We've been using silver alginate. He is on vancomycin. He has been revascularized on the left 05/30/18; he continues on vancomycin at dialysis. Revascularized on the left. The area on the left lateral foot is just about closed. Unfortunately the area over the plantar fifth metatarsal head undermining superiorly as does the area over the right third metatarsal head. Both of these significantly deteriorated from last week. He has appointments next week with infectious disease and orthopedics I delayed putting on a cast on the left until those appointments which therefore we made we'll bring him in on Friday the 14th with the idea of a cast on the left foot 06/06/18; he continues on vancomycin at dialysis. Revascularized on the left. He arrives today with a ballotable swelling just above the area on the left lateral foot where his previous wound was. He has no pain but he is reasonably insensate. The difficult areas on the plantar left fifth metatarsal head looks stable whereas the area on the right third plantar metatarsal head about the same as last week there is undermining here although I did not unroofed this today. He required a considerable debridement of the swelling on the left lateral foot area and I unroofed an abscess with a copious amount of brown purulent material which I obtained for culture. I don't believe during his recent hospitalization he had any further imaging  although I need to review this. Previous cultures from this area done in this clinic showed MRSA, I would be surprised if this is not what this is currently even though he is on vancomycin. 06/13/18; he continues on vancomycin at dialysis however he finishes this on Sunday and then graduates the doxycycline previously prescribed by Dr. [name]. The abscess site on the lateral foot that I unroofed last time grew moderate amounts of methicillin-resistant staph aureus that  is both vancomycin and tetracycline sensitive so we should be okay from that regard. He arrives today in clinic with a connection between the abscess site and the area on the lateral foot. We've been using silver alginate to all his wound areas. The right third metatarsal head wound is measuring smaller. Using silver alginate on both wound areas 06/20/18; transitioning to doxycycline prescribed by Dr. Ninetta Lights. The abscess site on the lateral foot that I unroofed 2 weeks ago grew MRSA. That area has largely closed down although the lateral part of his wound on the fifth metatarsal head still probes to bone. I suspect all of this was connected. On the right third metatarsal head smaller looking wound but surrounded by nonviable tissue that once again requires debridement using silver alginate on both areas 06/27/18; on doxycycline 100 twice a day. The abscess on the lateral foot has closed down. Although he still has the wound on the left fifth metatarsal head that extends towards the lateral part of the foot. The most lateral part of this wound probes to bone Right third metatarsal head still a deep probing wound with undermining. Nevertheless I elected not to debridement this this week If everything is copious static next week on the left I'm going to attempt a total contact cast 07/04/18; he is tolerating his doxycycline. The abscess on the left lateral foot is closed down although there is still a deep wound here that probes to bone. We  will use silver collagen under the total contact cast Silver collagen to the deep wound over the right third metatarsal head is well 07/11/18; he is tolerating doxycycline. The left lateral fifth plantar metatarsal head still probes deeply but I could not probe any bone. We've been using silver collagen and this will be the second week under the total contact cast Silver collagen to deep wound over the right third metatarsal head as well 07/18/18; he is still taking doxycycline. The left lateral fifth plantar metatarsal this week probes deeply with a large amount of exposed bone. Quite a deterioration. He required extensive debridement. Specimens of bone for pathology and CNS obtained Also considerable debridement on the right third metatarsal head 07/25/18; bone for pathology last week from the lateral left foot showed osteomyelitis. CandS of the bone Enterobacter. And Klebsiella. He is now on ciprofloxacin and the doxycycline is stopped [Dr. Ninetta Lights of infectious disease] area He has an appointment with Dr. Ninetta Lights tomorrow I'm going to leave the total contact cast off for this week. May wish to try to reapply that next week or the week after depending on the wound bed looks. I'm not sure if there is an operative option here, previously is followed with Dr. Luiz Blare 08/01/18 on evaluation today patient presents for reevaluation. He has been seen by infectious disease, Dr. Ninetta Lights, and he has placed the patient back on Ceftaz currently to be given to him at dialysis three times a week. 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 Fortunately there does not appear to be the evidence of infection at this point in time. Overall the patient has been tolerating the dressing changes without complication. We are no longer due lives in the cast of left foot secondary to the infection obviously. 08/10/18 on evaluation today patient appears to be  doing rather well all things considered in regard to his left plantar foot ulcer. He does have a significant callous on the lateral portion of  his left foot he wonders if I can help clean that away to some degree today. Fortunately he is having no evidence of infection at this time which is good news. No fevers chills noted. With that being said his right plantar foot actually does have a significant callous buildup around the wound opening this seems to not be doing as well as I would like at this point. I do believe that he would benefit from sharp debridement at this site. Subsequently I think a total contact cast would be helpful for the right foot as well. 08/17/18; the patient arrived today with a total contact cast on the right foot. This actually looks quite good. He had gone to see Dr. Megan Salon of infectious disease about the osteomyelitis on the left foot. I think he is on IV Fortaz at dialysis although I am not exactly sure of the rationale for the Fortaz at the time of this dictation. He also arrived in today with a swelling on the lateral left foot this is the site of his original wounds in fact when I first saw this man when he was under Dr. Ardeen Garland care I think this was the site of where the wound was located. It was not particularly tender however using a small scalpel I opened it to [place] some moderate amount of purulent drainage. 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 Last MRI was in May that showed progressive osteomyelitis and bone destruction of the cuboid and base of the fifth metatarsal. Last week he had an abscess over this same area this was removed. Culture was negative. We are continuing with a total contact cast to the area on the third metatarsal head on the right and making some good progress here. The patient asked me again about renal  transplant. He is not on the list because of the open wounds. 08/31/18; apparently the patient had an interruption in the [place] but he is now back on this at dialysis. Apparently there was a misunderstanding and Dr. Crissie Figures orders. Due to have the MRI of the left foot tonight. The area on the right foot continues to have callus thick skin and subcutaneous tissue around the wound edge that requires constant debridement however the wound is smaller we are using a total contact cast in this area. Alginate all wounds 09/07/18; without much surprise the MRI of his left foot showed osteomyelitis in the reminiscent of his fourth metatarsal but also the third metatarsal. She arrives in clinic today with the right foot and a total contact cast. There was purulent drainage coming out of the wound which I have cultured. Marked deterioration here with undermining widely around the wound orifice. Using pickups and a scalpel I remove callus and subcutaneous tissue from a substantial new opening. In a similar fashion the area on the left lateral foot that was blistered last week I have opened this and remove skin and subcutaneous tissue from this area to expose a obvious new wound. I think there is extension and communication between all of this on the left foot. The patient is on Fortaz at dialysis. Culture done of the right foot. We are clearly not making progress here. We have made him an appointment with Dr. [name] of orthopedics. I think an amputation of the left leg may be discussed. I don't think there is anything that can be done with  foot salvage. 09/14/18; culture from the right foot last time showed a very resistant MRSA. This is resistant to doxycycline. I'm going to try to get linezolid at least for a week. He does not have a appointment with Dr. [name] yet. 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 I'll send a message to Dr. [name] of  infectious disease. I want the patient to see Dr. [name] to go over the pros and cons of an amputation which I think will be a BKA. The patient had a question about whether this is curative or not. I told him that I thought it would be although spread of staph aureus infection is not unheard of. MRI of the right foot was done at the end of April 2019 did not show osteomyelitis. The patient last saw Dr. [name] on 9/3. I'll send Dr. [name] a message. I would really like him to weigh the pros and cons of a BKA on the left otherwise he'll probably need IV antibiotics and perhaps hyperbaric oxygen again. He has not had a good response to this in the past. His MRI earlier this month showed progressive damage in the remnants of the fourth and third metatarsal 09/21/2018; sees Dr. [name] of orthopedics next week to discuss a left BKA in response to the osteomyelitis in the left foot. Using silver alginate to all wound areas. He is completing the Zyvox I did put in for him after last culture showed MRSA 09/29/2018; patient saw Dr. [name] of orthopedics discussed the osteomyelitis in the left foot third and fourth metatarsals. He did not recommend urgent surgery but certainly stated the only surgical option would be a BKA. He is communicating with Dr. [name]. The problem here is the instability of the areas on his foot which constantly generate draining abscesses. I will communicate with Dr. [name] about this. He has an appointment on 10/23 10/05/2018; patient sees Dr. [name] on 10/23. I have sent him a secure message not ordered any additional antibiotics for now. Patient continues to have 2 open areas on the left plantar foot at the fifth metatarsal head and the lateral aspect of the foot. These have not changed all that much. The area on the right third met head still has thick callus and surrounding subcutaneous tissue we have been using silver alginate 10/12/2018; patient sees Dr. [name] or colleague on  10/23. 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 The area on the right plantar foot is just about closed. The superior wound on the left fifth metatarsal head is callused over but I am not sure if this is closed. The area below it is about the same. Considerable amount of callus on the lateral foot. We have been using silver alginate to the wounds 10/19/2018; the patient saw Dr. [name] on 10/22. He wishes to try and continue to save the left foot. He has been given 6 weeks of oral ciprofloxacin. We continue to put a total contact cast on the right foot. The area over the fifth metatarsal head on the left is callused over/perhaps healed but I did not remove the callus to find out. He still has the wound on the left lateral midfoot requiring debridement. We have been using silver alginate to all wound areas 10/26/2018 On the left foot our intake nurse noted some purulent drainage from the inferior wound [currently the only one that is  not callused over] On the right foot even though he is in total contact cast a considerable amount of thick black callus and surface eschar. On this side we have been using silver alginate under a total contact cast. he remains on ciprofloxacin as prescribed by Dr. [name] 11/02/2018; culture from last week which is done of the probing area on the left midfoot wound showed MRSA "few". I am going to need to contact Dr. [name] which I will do today I think he is going to need IV antibiotics again. The area on the left foot which was callused on the side is clearly separating today all of this was removed a copious amounts of callus and necrotic subcutaneous tissue. The area on the right plantar foot actually remained healthy looking with a healthy granulated base. We are using silver alginate to all wound areas 11/09/2018; unfortunately neither 1 of the patient's wounds areas looks at all satisfactory. On  the left he has considerable necrotic debris from the plantar wound laterally over the foot. I removed copious amounts of material including callus skin and subcutaneous tissue. On the right foot he arrives with undermining and frankly purulent drainage. Specimen obtained for culture and debridement of the callused skin and subcutaneous tissue from around the circumference. Because of this I cannot put him back in a total contact cast but to be truthful we are really unfortunately not making a lot of progress I did put in a secure text message to Dr. [name] wondering about the MRSA on the left foot. He suggested vancomycin although this is not started. I was left wondering if he expected me to send this into dialysis. Has we have more purulence on the right side wait for that result before calling dialysis 11/16/2018; I called dialysis earlier this week to get the vancomycin started. I think he got the first dose on Tuesday. The patient tells me that he fell earlier this week twisting his left foot and ankle and he is swelling. He continues to not look well. He had blood cultures done at dialysis on Tuesday he is not been informed of the results. 12/07/2018; the patient was admitted to hospital from 11/22/2018 through 12/02/2018. He underwent a left BKA. Apparently had staph sepsis. In the meantime being off the right foot this is closed over. He follows up with Dr. 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 Subsequent to this he actually had multiple falls in fact he tells me to following which calls the wound to be his apparently. He has been seeing Dr. [name] and his physician assistant in the interim. They actually did want him to come back for reevaluation to see if there's anything we can do for me wound care perspective the help this area to heal more appropriately. The patient  states that he does not have a tremendous amount of pain which is good news. No fevers, chills, nausea, or vomiting noted at this time. We have gotten approval from Dr. [name] office had [place] for [place] to treat the patient for his stop wound. This is due to the fact that the patient was in the 90 day postop global. 1/21; the patient does not have an open wound on the right foot although he does have some pressure areas that will need to be padded when he is transferring. The dehisced surgical wound looks clean although there is some undermining of  1.5 cm superiorly. 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 This appears healthy but there is undermining. TheraSkin #1 2/11; TheraSkin #2 2/25; TheraSkin #3. Wound is measuring smaller 3/10; TheraSkin #4 wound is measuring smaller 3/24; TheraSkin #5 wound is measuring smaller and looks healthy. 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 He has no open area on the right foot although he does have some callused area over the original third metatarsal head wound. The third metatarsal is also subluxed on the right foot. I have warned him today that he cannot consider wearing a prosthesis for at least a month but he can go for measurements. He is going to need to keep the right foot padded is much as possible in his diabetic shoe indefinitely READMISSION 06/12/2019 Mr. Fairbairn is a type II diabetic on dialysis. He has been in this clinic multiple times with wounds on his bilateral lower extremities. 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 He also has a history of wounds on his right foot but tells me that everything is going well here. He actually came in here with his prosthesis walking with a cane. We were all really quite gratified to see  this He states over the last several weeks he has had a painful area at the tip of the left third finger. His dialysis shunt in his is in the left upper arm and this particularly hurts during dialysis when his fingers get numb and there is a lot of pain in the tip of his left third finger. I believe he is seen vascular surgery. He had a Doppler done to evaluate for a dialysis steal syndrome. He is going for a banding procedure 1 week from today towards the left AV fistula. I think if that is unsuccessful they will be looking at creating a new shunt. He had a course of doxycycline when he which he finished about a week ago. He has been using Bactroban to the finger 6/25; the patient had his banding procedure and things seem to be going better. He is not having pain in his hand at dialysis. The area on the tip of his finger seems about closed. He did however have a paronychia I the last time I saw him. I have been advising him to use topical Bactroban washing the finger. Culture I did last time showed a few staph epidermidis which I was willing to dismiss as superficial skin contaminant. He arrives today with the finger wound looking better however the paronychia a seems worse 7/9; 2-week follow-up. He does not have an open wound on the tip of his third left finger. I thought he had an initial ischemic wound on the tip of the finger as well as a paronychia a medially. All of this appears to be closed. 8/6-Patient returns after 3 weeks for follow-up and has a right second met head plantar callus with a significant area of maceration hiding an ulcer ABI repeated today on right - 1.26 8/13-Patient returns at 1 week, the right second met head plantar wound appears to be slightly better, it is surrounded by the callus area we are using silver alginate 8/20; the patient came back to clinic 2 weeks ago with a wound on the right second metatarsal head. He apparently  told me he developed callus in this area  but also went away from his custom shoes to a pair of running shoes. Using silver alginate. 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 There might be much we can do to offload this although the patient tells me he has a wheelchair that he is using to try and stay off the wound is much as possible 8/27; right second metatarsal head. Still small punched out area with thick callus and subcutaneous tissue around the wound. We have been using silver collagen. This is always been an issue with this man's wound especially on this foot 9/3; right second met head. Still the same punched-out area with thick callus and subcutaneous tissue around the wound removing the circumference demonstrates repetitively undermining area. I have removed all the subcutaneous tissue associated with this. We have been using silver collagen 9/15; right second metatarsal head. Same punched-out thick callus and subcutaneous tissue around the wound area. Still requiring debridement we have been using silver collagen I changed back to silver alginate X-ray last time showed no evidence of osteomyelitis. Culture showed Klebsiella and he was started and Keflex apparently just 4 days ago 9/24; right second metatarsal head. He is completed the antibiotics I gave him for 10 days. Same punched-out thick callus and subcutaneous tissue around the area. Still requiring debridement. We have been using silver alginate. The area itself looks swollen and this could be just [place] that comes on this area with walking on a prosthesis on the left however I think he needs an MRI and I am going to order that today. There is not an option to offload this further 10/1; right second plantar metatarsal head. MRI booked for October 6. Generally looking better today using silver alginate 10/8; right second plantar metatarsal head. MRI that was done on 10/6 was negative for osteomyelitis noted prior amputations of the second and fourth  toes at the MTP. Prior amputation of the third toe to the base of the middle phalanx. Prior amputation of the fifth toe to the head of the metatarsal. 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 He does not recall about hearing about this. He did not have any pain but then again he has reduced sensation from diabetic neuropathy 10/15; right second plantar metatarsal head. Still in the same condition callus nonviable subcutaneous tissue which cleans up nicely with debridement but reforms by the next week. The patient tells me that he is offloading this is much as he can including taking his wheelchair to dialysis. We have been using silver alginate, changed to silver collagen today 10/22; right second plantar metatarsal head. Wound measures smaller but still thick callus around this wound. Still requiring debridement 11/5 right second plantar metatarsal head. Less callus around the wound circumference but still requiring debridement. There is still some undermining which I cleaned out the wound looks healthy but still considerable punched out depth with a relatively small circumference to the wound there is no palpable bone no purulent drainage 11/12; right second plantar metatarsal head. Small wound with thick callus tissue around the wound and significant undermining. We have been using silver collagen after my continuous debridements of this area 11/19; patient arrives in today with purulent drainage coming out of the wound in the second third met head area on the right foot. Serosanguineous thick drainage. Specimen obtained for culture. 11/21/2019 on evaluation today patient appears to be doing  well with regard to his foot ulcer. The good news is is culture came back negative for any bacteria there was no growth. The other good news is his x-ray also appears to be doing well. The foot is also measuring much better than what it was. Overall I am very  pleased with how things seem to be progressing. 12/22; patient was in [facility] for several weeks due to the death of a family member. He said he stayed off the wound using silver alginate. 01/03/2020. Patient has a small open area with roughly 0.3 cm of circumferential undermining. The orifice looks smaller. We have been using silver collagen. There is not a wound more aggressive way to offload this area as he has a prosthesis on the other leg 1/21; again the same small area is 2 weeks ago. We have been using silver collagen I change that to endoform today I really believe that this is an offloading issue 2/4; the area on the right third met head. We have been using endoform. 2/11 the area on the right third met head we have been using silver alginate. 2/25; the area on the right third met head. There is much less callus on this today. The patient states he is offloading this even more aggressively. As an example he is taking his wheelchair into dialysis on most days 3/4; right third metatarsal head. Again he has eschar over the surface of the wound which I removed with a #3 curette. Some subcutaneous debris. This has 0.8 cm of direct probing depth. I cannot feel any bone here. I did do a culture the area 3/11; right third metatarsal head. Again an eschared area over the wound which almost makes this look superficial. Again debridement reveals a wound with probing depth probably not much different from last week there is no palpable bone no palpable purulence. The patient tells me that he is making every effort to offload this area properly. He is taking wheelchair into dialysis etc. There is no option for forefoot offloading or a total contact cast. We used Oasis #1 today 3/18; again callus and eschar over the wound surface. I remove this but still a probing wound although only 3 mm in depth this time. This is an improvement 3/25; again callus and eschar over the wound surface which I  removed the wound is much deeper today at 0.7 cm. There is no palpable bone. Because of the appearance of this a culture was done. I am not going to put the Oasis back in this. Concerned about underlying infection. Notable for the fact that he is just finished doxycycline for the last go round of this 3/30; still 0.7 cm in depth which is disappointing. Culture I gave last time showed a few Staph aureus which is methicillin susceptible. This should have been taken care of by the recent course of doxycycline I gave him. We are using silver alginate 4/6; almost 1 cm in depth. We have been using silver alginate with underlying Bactroban. 4/15; about 1 cm in depth still. There is no palpable bone but there is undermining thick surface again as usual. Our intake nurse noted what looked to be purulent drainage I suppose that could have been Bactroban but I did a culture for CandS 4/20; depth down 0.7 cm. Culture from last time was negative we have been using Bactroban and silver alginate 4/29; depth down 0.5 cm. Using silver alginate. He reports some drainage 5/6; comes in with the wound worse. Wound is  deeper and tunnels. Measures 0.9 depth and an undermining area of 1.8 cm. 5/18; there is a change the dressing last week. Still using Sorbact hydrogel. What I can see of the base of the wound looks better. 6/1. Not much change. I changed him to endoform today. He has 1.1 cm in depth no palpable bone 6/8; again a small punched out wound with significant undermining. Culture I did last week was negative. T oday he had some purulent drainage perhaps mixed with some retained endoform. 6/22; MRI report below IMPRESSION: Skin ulceration on the plantar surface of the foot at the level of the head of the second metatarsal. Mild marrow edema in the head of the second metatarsal could be reactive or due to osteomyelitis. Thin fluid collection surrounding the head and neck of the second metatarsal appears to  communicate with the patient's skin wound and is worrisome for a septic collection. Advanced first MTP osteoarthritis. Prior amputations as noted above. Electronically Signed By: Inge Rise M.D. On: 06/16/2020 11:51 PCR culture I did last time I believe showed peptostreptococcus. There was no comment on any other gram-negative or gram-positive organisms. I gave him a course of Flagyl which I think he is finishing up. We are not making any progress with the wound over the right second/third metatarsal head. We are using silver alginate putting him in kerlix Coban 6/29; has his appointment with Dr. [name] of orthopedics next Tuesday I think we see him on the same day. He was in for a nurse visit today. Nurse noted undermining of skin and callus so I was asked to look at this. We have been using silver alginate and kerlix Coban 7/6; patient saw Dr. [name] PA today will see Dr. [name] on Tuesday. The major question is does he needs surgery to clean out the area in question culture question resect any involved bone etc. Otherwise he will need to see infectious disease for broad-spectrum antibiotics given at dialysis 7/13; I talked to Dr. [name] late last week. He was not impressed with the MRI results thinking that the bone changes may have been reactive rather than osteomyelitis. He did not think he needed drainage of an abscess thinking of the fluid we are seeing was superficial and contiguous with the actual wound. He did not think he needed surgery or resection. Recommended consideration of infectious disease and return him to our care. We have been using silver alginate 7/20; PCR culture I did last week showed staph aureus methicillin sensitive. I have him on twice daily Keflex. Silver alginate to the wound 7/27; I have continued his 500 every 12 of cephalexin with a second dose after dialysis. My intention is to give him 4 to 6 weeks of antibiotics directed at [facility] [address] 07/31/2020 on evaluation  today patient appears to be doing a little better in regard to his overall wound size. This is good news. He does have a fairly significant callus buildup still and I am can work on that today. Fortunately there is no signs of active infection at this time. He has been on Keflex and has an extension of that that he is going to pick up and continue taking. 08/07/20-Patient back at 1 week with right met head wound looking about the same, overall the callus removal has helped Electronic Signature(s) Signed: 08/07/2020 2:36:53 PM By: Tobi Bastos MD, MBA Entered By: Tobi Bastos on 08/07/2020 14:36:53 -------------------------------------------------------------------------------- Physical Exam Details Patient Name: Date of Service: [name] Hones EL B. 08/07/2020 1:30 PM Medical Record Number:  [id number] Patient Account Number: [id number] Date of Birth/Sex: Treating RN: March 30, 1951 (69 y.o. [name] Primary Care Provider: [name] Other Clinician: Referring Provider: Treating Provider/Extender: Raiford Noble in Treatment: 60 Constitutional alert and oriented x 3. sitting or [name] blood pressure is within target range for patient.. supine blood pressure is within target range for patient.. pulse regular and within target range for patient.Marland Kitchen respirations regular, non-labored and within target range for patient.Marland Kitchen temperature within target range for patient.. . . Well- nourished and well-hydrated in no acute distress. Notes Right plantar met head wound small with very minimal callus around the rim Electronic Signature(s) Signed: 08/07/2020 2:37:17 PM By: Tobi Bastos MD, MBA Entered By: Tobi Bastos on 08/07/2020 14:37:17 -------------------------------------------------------------------------------- Physician Orders Details Patient Name: Date of Service: [name] Hones EL B. 08/07/2020 1:30 PM Medical Record Number: [id number] Patient Account Number:  [id number] Date of Birth/Sex: Treating RN: [birth year] (69 y.o. 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 Dressing Change Frequency Wound #41 Right Metatarsal head second Do not change entire dressing for one week. Skin Barriers/Peri-Wound Care Moisturizing lotion Wound Cleansing May shower with protection. - patient to use a cast protector Primary Wound Dressing Wound #41 Right Metatarsal head second Calcium Alginate with Silver Secondary Dressing Wound #41 Right Metatarsal head second Dry Gauze Other: - felt callous pad Edema Control Kerlix and Coban - Right Lower Extremity - cotton cast padding instead of kerlix Avoid [name] for long periods of time Elevate legs to the level of the heart or above for 30 minutes daily and/or when sitting, a frequency of: - throughout the day Additional Orders / Instructions Other: - minimize walking and [name] on foot to aid in wound healing and callus buildup. Electronic Signature(s) Signed: 08/07/2020 5:03:09 PM By: Tobi Bastos MD, MBA Signed: 08/08/2020 5:19:54 PM By: Levan Hurst RN, BSN Entered By: Levan Hurst on 08/07/2020 14:32:34 -------------------------------------------------------------------------------- Problem List Details Patient Name: Date of Service: [name], [name] 08/07/2020 1:30 PM Medical Record Number: [id number] Patient Account Number: [id number] Date of Birth/Sex: Treating RN: Dec 06, 1951 (69  y.o. 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, other extremity 06/12/2019 No Yes L03.115 Cellulitis of right lower limb 11/15/2019 No Yes Inactive Problems ICD-10 Code Description Active Date Inactive Date L03.114 Cellulitis of left upper limb 06/12/2019 06/12/2019 L98.498 Non-pressure chronic ulcer of skin of other sites with other specified severity 06/12/2019 06/12/2019 S61.209D Unspecified open wound of unspecified finger without damage to nail, subsequent 06/12/2019 06/12/2019 encounter Resolved Problems Electronic Signature(s) Signed: 08/07/2020 5:03:09 PM By: Tobi Bastos MD, MBA Signed: 08/08/2020 5:19:54 PM By: Levan Hurst RN, BSN Entered By: Levan Hurst on 08/07/2020 14:29:19 -------------------------------------------------------------------------------- Progress Note Details Patient Name: Date of Service: [name], [name] Jakob EL B. 08/07/2020 1:30  PM Medical Record Number: [id number] Patient Account Number: [id number] Date of Birth/Sex: Treating RN: [birth year] (69 y.o. Elizebeth Koller Primary Care Provider: Knox Royalty Other Clinician: Referring Provider: Treating Provider/Extender: Latanya Presser in Treatment: 60 Subjective History of Present Illness (HPI) The following HPI elements were documented for the patient's wound: Location: Patient presents with a wound to bilateral feet. Quality: Patient reports experiencing essentially no pain. Severity: Mildly severe wound with no evidence of infection Duration:  Patient has had the wound for greater than 2 weeks prior to presenting for treatment Stomatitis cyst the patient is a pleasant 69 yrs old bm here for evaluation of ulcers on the plantar aspect of both feet. 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 He has a left arm graft for access. He has been trying to stay off his feet for weeks but does not seem to have any improvement in the wound. He has been seeing someone at the foot center and was referred to the Wound Care center for further evaluation. 12/30/15 the patient has 3 wounds one over the right first metatarsal head and 2 on the left foot at the left fifth and lleft first metatarsal head. All of these look relatively similar. The one over the left fifth probe to bone I could not prove that any of the others did. I note his MRI in November that did not show osteomyelitis. His peripheral pulses seem robust. All of these underwent surgical debridement to remove callus nonviable skin and subcutaneous tissue 01/06/16; the patient had his sutures removed from the right fifth ray amputation. There may be a small open part of this superiorly but otherwise the incision looks good. Areas over his right first, left first and left fifth metatarsal head all underwent surgical debridement as a varying degree of callus, skin and nonviable subcutaneous tissue. The area that is most worrisome is the right fifth metatarsal head which has a wound probes precariously close to bone. There is no purulent drainage or erythema 01/13/16; I'm not exactly sure of the status of the right fifth ray amputation site however he follows with Dr. Luiz Blare later this week. The area over his right first plantar metatarsal head, left first and fifth plantar metatarsal head are all in the same status. Thick circumferential callus, nonviable subcutaneous tissue. Culture of the left fifth did not culture last week 01/20/16; all of the  patient's wounds appear and roughly the same state although his amputation site on the right lateral foot looks better. His wounds over the right first, left first and left fifth metatarsal heads all underwent difficult surgical debridement removing circumferential callus nonviable skin and subcutaneous tissue. There is no overt evidence of infection in these areas. MRI at the end of December of the left foot did not show osteomyelitis, right foot showed osteomyelitis of the right fifth digit he is now status post amputation. 01/27/16 the patient's wounds over his plantar first and fifth metatarsal heads on the left all appear better having been started on a total contact cast last week. They were debridement of circumferential callus and nonviable subcutaneous tissue as was the wound over the first metatarsal head. His surgical incision on the right also had some light surface debridement done. 03/23/2016 -- the patient was doing really well and most of his wounds had almost completely healed but now he came back today with a history of having a discharge from the area of his right  foot on the plantar aspect and also between his first and second toe. He also has had some discharge from the left foot. Addendum: I spoke to the PA [name] at the dialysis center whose fax number is [phone]. We discussed the infection the patient has and she will put the patient on vancomycin and Fortaz until the final culture report is back. We will fax this as soon as available. 03/30/2016 -- left foot x-ray IMPRESSION:No definitive osteomyelitis noted. X-ray of the right foot -- IMPRESSION: 1. Soft tissue swelling. Prior amputation right fifth digit. No acute or focal bony abnormality identified. If osteomyelitis remains a clinical concern, MRI can be obtained. 2. Peripheral vascular disease. His culture reports have grown an MSSA -- and we will fax this report to his hemodialysis center. He was called in  a prescription of oral doxycycline but I have told her not to fill this in as he is already on IV antibiotics. 04/06/2016 -- a few days ago, I spoke to the hemodialysis center nurse who had stopped the IV antibiotics and he was given a prescription for doxycycline 100 mg by mouth twice a day for a week and he is on this at the present time. 05/11/2016 -- he has recently seen his PCP this week and his hemoglobin A1c was 7. He is working on his paperwork to get his orthotic shoes. 05/25/2016 -- -- x-ray of the left foot IMPRESSION:No acute bony abnormality. No radiographic changes of acute osteomyelitis. No change since prior study. X-ray of the right foot -- IMPRESSION: Postsurgical changes are seen involving the fifth toe. No evidence of acute osteomyelitis. he developed a large blister on the medial part of his right foot and this opened out and drain fluid. 06/01/2016 -- he has his MRI to be done this afternoon. 06/15/2016 -- MRI of the left forefoot without contrast shows 2 separate regions of cutaneous and subcutaneous edema and possible ulceration and blistering along the ball of the foot. No obvious osteomyelitis identified. MRI of the right foot showed cutaneous and subcutaneous thickening plantar to the first digit sesamoid with an ulcer crater but no underlying osteomyelitis is identified. 06/22/2016 -- the right foot plantar ulcer has been draining a lot of seropurulent material for the last few days. 06/30/2016 -- spoke to the dialysis center and I believe I spoke to [place] a PA at the center who discussed with me and agreed to putting Legrand Como on vancomycin until his cultures arrive. On review of his culture report no WBCs were seen or no organisms were seen and the culture was reincubated for better growth. The final report is back and there were no predominant growth including Streptococcus or Staphylococcus. Clinically though he has a lot of drainage from both wounds a lot of  undermining and there is further blebs on the left foot towards the interspace between his first and second toe. 08/10/2016 -- the culture from the right foot showed normal skin flora and there was no Staphylococcus aureus was group A streptococcus isolated. 09/21/2016 -- -- MRI of the right foot was done on 09/13/2016 - IMPRESSION: Findings most consistent with acute osteomyelitis throughout the great toe, sesamoid bones and plantar aspect of the head of the first metatarsal. Fluid in the sheath of the flexor tendon of the great toe could be sympathetic but is worrisome for septic tenosynovitis. First MTP joint effusion worrisome for septic joint. He was admitted to the hospital on 09/11/2016 and treated for a fever with vancomycin and  cefepime. He was seen by Dr. Bobby Rumpf of infectious disease who recommended 6 weeks treatment with vancomycin and ceftazidime with his hemodialysis and to continue to see as in the wound clinic. Vascular consult was pending. The patient was discharged home on 09/14/2016 and he would continue with IV antibiotics for 6 weeks. 09/28/16 wound appears reasonably healthy. Continuing with total contact cast 10/05/16 wound is smaller and looking healthy. Continue with total contact cast. He continues on IV antibiotics 10/12/2016 -- he has developed a new wound on the dorsal aspect of his left big toe and this is a superficial injury with no surrounding cellulitis. He has completed 30 days of IV antibiotics and is now ready to start his hyperbaric oxygen therapy as per his insurance company's recommendation. 10/19/2016 -- after the cast was removed on the right side he has got good resolution of his ulceration on the right plantar foot. he has a new wound on the left plantar foot in the region of his fourth metatarsal and this will need sharp debridement. 10/26/2016 -- Xray of the right foot complete: IMPRESSION: Changes consistent with osteomyelitis involving the head  of the first metatarsal and base of the first proximal phalanx. The sesamoid bones are also likely involved given their positioning. 11/02/2016 -- there still awaiting insurance clearance for his hyperbaric oxygen therapy and hopefully he will begin treatment soon. 11/09/2016 -- started with hyperbaric oxygen therapy and had some barotrauma to the right ear and this was seen by ENT who was prescribed Afrin drops and would probably continue with HBO and he is scheduled for myringotomy tubes on Friday 11/16/2016 -- he had his myringotomy tubes placed on Friday and has been doing much better after that with some fluid draining out after hyperbaric treatment today. The pain was much better. 11/30/2016 -- over the last 2 days he noticed a swelling and change of color of his right second toe and this had been draining minimal fluid. 12/07/2016 -- x-ray of the right foot -- IMPRESSION:1. Progressive ulceration at the distal aspect of the second digit with significant soft tissue swelling and osseous changes in the distal phalanx compatible with osteomyelitis. 2. Chronic osteomyelitis at the first MTP joint. 3. Ulcerations at the second and third toes as well without definite osseous changes. Osteomyelitis is not excluded. 4. Fifth digit amputation. On 12/01/2016 oo I spoke to Dr. Bobby Rumpf, the infectious disease specialist, who kindly agreed to treat this with IV antibiotics and he would call in the order to the dialysis center and this has been discussed in detail with the patient who will make the appropriate arrangements. The patient will also book an appointment as soon as possible to see Dr. [name] in the office. Of note the patient has not been on antibiotics this entire week as the dialysis center did not receive any orders from Dr. [name]. I got in touch with Dr. [name] who tells me the patient has an appointment to see him this coming Wednesday. 12/14/2016 -- the patient is on  ceftazidime and vancomycin during his dialysis, and I understand this was put on by his nephrologist Dr. Raliegh Ip possibly after speaking with infectious disease Dr. [name] 12/23/16; patient was on my schedule today for a wound evaluation as he had difficulties with his schedule earlier this week. I note that he is on vancomycin and ceftazidine at dialysis. In spite of this he arrives today with a new wound on the base of the right second toe this easily probes to  bone. The known wound at the tip of the second toe With this area. He also has a superficial area on the medial aspect of the third toe on the side of the DIP. This does not appear to have much depth. The area on the plantar left foot is a deep area but did not probe the bone 12/28/16 -- he has brought in some lab work and the most recent labs done showed a hemoglobin of 12.1 hematocrit of 36.3, neutrophils of 51% WBC count of 5.6, BN of 53, albumin of 4.1 globulin of 3.7, vancomycin 13 g per mL. 01/04/2017 -- he saw Dr. [name] who has recommended a amputation of the right second toe and he is awaiting this date. He sees Dr. Bobby Rumpf of infectious disease tomorrow. The bone culture taken on 12/28/2016 had no growth in 2 days. 01/11/2017 -- was seen by Dr. Bobby Rumpf regarding the management and has recommended a eval by vascular surgeons. He recommended to continue the antibiotics during his hemodialysis. Xray of the left foot -- IMPRESSION: No acute fracture, dislocation, or osseous erosion identified. 01/18/2017 -- he has his vascular workup later today and he is going to have his right foot second toe amputation this coming Friday by Dr. [name]. We have also put in for a another 30 treatments with hyperbaric oxygen therapy 01/25/2017 -- I have reviewed Dr. Donnetta Hutching is vascular report from last week where he reviewed him and thought his vascular function was good enough to heal his amputation site and no further tests were  recommended. He also had his orthopedic related to surgery which is still pending the notes and we will review these next week. 02/01/2017 -- the operative remote of Dr. 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 02/08/2017 --he was seen by Dr. [name] on 02/03/2017 -- patient is on [place]. after a thorough review he had recommended to continue with antibiotics during hemodialysis. The patient was seen by Dr. [name], but I do not find any follow-up note on the electronic medical record. he has removed the dressing over the right foot to remove the sutures and asked him to see him back in a month's time 02/22/2017 -- patient has been febrile and has been having symptoms of the upper respiratory tract infection but has not been checked for the flu and it's been over 4 days now. He says he is feeling better today. Some drainage between his left first and second toe and this needed to be looked at 03/08/2017 -- was seen by Dr. Bobby Rumpf on 03/07/2017 after review he will stop his antibiotics and see him back in a month to see if he is feeling well. 03/15/2017 -- he has completed his course of hyperbaric oxygen therapy and is doing fine with his health otherwise. 03/22/2017 -- his nutritionist at the dialysis center has recommended a protein supplement to help build his collagen and we will prescribe this for him when he has the details 05/03/2017 -- he recently noticed the area on the plantar aspect of his fifth metatarsal head which opened out and had minimal drainage. 05/10/2017 -- -- x-ray of the left foot -- IMPRESSION:1. No  convincing  conventional radiographic evidence of active osteomyelitis.2. Active soft tissue ulceration at the tip of the fifth digit, and at the lateral aspect of the foot adjacent the base of the fifth metatarsal. 3. Surgical changes of prior fourth toe amputation. 4. 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 5. Small vessel atherosclerotic vascular calcifications. 6. 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 ======= Readmission after 5 weeks: 06/15/2017 -- the patient returns after 5 weeks having had an MRI done on 05/17/2017 MRI of the left foot without contrast showed soft tissue ulcer overlying the base of the fifth metatarsal. 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 No drainable fluid collection to suggest an abscess. He was in hospital between 05/16/2017 and 05/25/2017 -- and on discharge was asked to follow-up with Dr. [name] and Dr. [name]. He was treated 2 weeks post discharge with vancomycin plus ceftriaxone with hemodialysis and oral metronidazole 500 mg 3 times a day. 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 last hemoglobin A1c was 6.7% He had a postoperative MR of the foot on 05/23/2017 -- which showed no new areas of cortical bone loss, edema or soft tissue ulceration to suggest osteomyelitis. the patient has completed his course of IV antibiotics during dialysis and is to see the infectious disease doctor tomorrow. Dr. [name] had seen him after suture removal and asked him to keep the wound with a dry dressing 06/21/2017 -- he was seen by Dr. Bobby Rumpf of infectious disease on 06/16/2017 -- he stopped his Flagyl and will see him back in 6 weeks. He was asked to follow-up with Dr. [name] and with the wound clinic clinic. 07/05/17; bone biopsy from last time showed acute  osteomyelitis. This is from the reminiscent in the left fifth metatarsal. Culture result is apparently still pending [holding for anaerobe}. From my understanding in this case this is been a progressive necrotic wound which is deteriorated markedly over the last 3 weeks since he returned here. He now has a large area of exposed bone which was biopsied and cultured last week. Dr. Graylon Good has put him on vancomycin and Fortaz during his hemodialysis and Flagyl orally. He is to see Dr. [name] next week 07/19/2017 -- the patient was reviewed by Dr. [name] of orthopedics who reviewed the case in detail and agreed with the plan to continue with IV antibiotics, aggressive wound care and hyperbaric oxygen therapy. He would see him back in 3 weeks' time 08/09/2017 -- saw Dr. Bobby Rumpf on 08/03/2017 -he was restarted on his antibiotics for 6 weeks which included vanco, ceftaz and Flagyl. he recommended continuing his antibiotics for 6 weeks and reevaluate his completion date. He would continue with wound care and hyperbaric oxygen therapy. 08/16/2017 -- I understand he will be completing his 6 weeks of antibiotics sometime later this week. 09/19/2017 -- he has been without his wound VAC for the last week due to lack of supplies. He has been packing his wound with silver alginate. 10/04/2017 -- he has an appointment with Dr. [name] tomorrow and hence we will not apply the wound VAC after his dressing changes today. 10/11/2017 -- he was seen by Dr. Bobby Rumpf on 10/05/2017, noted that the patient was currently off antibiotics, and after thorough review he recommended he follow-up with the wound care as per plan and no further antibiotics at this  point. 11/29/17; patient is arrived for the wound on his left lateral foot.Marland Kitchen He is completed IV antibiotics and 2 rounds of hyperbaric oxygen for treatment of underlying osteomyelitis. He arrives today with a surface on most of the wound area however our intake  nurse noted drainage from the superior aspect. This was brought to my attention. 12/13/2017 -- the right plantar foot had a large bleb and once the bleb was opened out a large callus and subcutaneous debris was removed and he has a plantar ulcer near the fourth metatarsal head 12/28/17 on evaluation today patient appears to be doing acutely worse in regard to his left foot. The wound which has been appearing to do better as now open up more deeply there is bone palpable at the base of the wound unfortunately. He tells me that this feels "like it did when he had osteomyelitis previously" he also noted that his second toe on the left foot appears to be doing worse and is swollen there does appear to be some fluid collected underneath. His right foot plantar ulcer appears to be doing somewhat better at this point and there really is no complication at the site currently. No fevers, chills, nausea, or vomiting noted at this time. Patient states that he normally has no pain at this site however. T oday he is having significant pain. 01/03/18; culture done last week showed methicillin sensitive staph aureus and group B strep. He was on Septra however he arrives today with a fever of 101. He has a functional dialysis shunt in his left arm but has had no pain here. No cough. He still makes some urine no dysuria he did have abdominal pain nausea and diarrhea over the weekend but is not had any diarrhea since yesterday. Otherwise he has no specific complaints 01/05/18; the patient returns today in follow-up for his presentation from 1/8. At that point he was febrile. I gave him some Levaquin adjusted for his dialysis status. He tells me the fever broke that night and it is not likely that this was actually a wound infection. He is gone on to have an MRI of the left foot; this showed interval development of an abnormal signal from the base of the third and fourth metatarsal and in the cuboid reminiscent consistent  with osteomyelitis. There is no mention of the left second toe I think which was a concern when it was ordered. The patient is taking his Levaquin 01/10/18; the patient has completed his antibiotics today. The area on the left lateral foot is smaller but it still probes easily to bone. He has underlying osteomyelitis here and sees Dr. Megan Salon of infectious disease this week ooThe area on the right third plantar metatarsal head still requires debridement not much change in dimensions [facility] has a new wound on the medial tip of his right third toe again this probes to bone. He only noticed this 2 days ago 01/17/18; the patient saw Dr. [name] last week and he is back on Vanco and Fortaz at dialysis. 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 We are previously seeing him for a wound also on the base of the fourth med head on the right. X-ray of this area did not show osteomyelitis in relation to a new wound on the tip of his right third toe. Again this probes to bone. Finally his second toe on  the left which didn't really show anything on the MRI at least no report appears to have a separated cutaneous area which I think is going to come right off the tip of his toe and leave exposed bone. Whether this is infectious or ischemic I am not clear Culture I did from the third toe last week which was his new wound was negative. Plain x-ray on the right foot did not show osteomyelitis in the toes 01/24/18; the patient is going went to see Dr. [name]. He is going to have a amputation of the left second and right third toe although paradoxically the left second toe looks better than last week. We have treating a deep probing wound on the left lateral foot and the area over the third metatarsal head on the right foot. 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 Also a  debridement including bone of the left lateral foot and a closure which is still sutured. This is a bit surprising. Also apparent debridement of the base of the right third toe wound. 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 I'm not sure exactly where we are in the frame of things here. The wound on the left lateral foot is still sutured also the amputation sites of the left second and right third toe. He is been using Xeroform to the sutured areas silver alginate to the right foot 02/14/18; he continues on Rocephin at dialysis for osteomyelitis. I still don't have a good sense of where we are in the treatment duration. The wounds on the left lateral foot had the sutures removed and this clearly still probes deeply. We debrided the area with a #5 curet. We'll use silver alginate to the wound oothe area over the right third met head also required debridement of callus skin and subcutaneous tissue and necrotic debris over the wound surface. This tunnels superiorly but I did not unroofed this today. ooThe areas on the tip of his toes have a crusted surface eschar I did not debridement this either. 02/21/18; he continues on Rocephin at dialysis for osteomyelitis. I don't have a good sense of where we are and the treatment duration. The wounds on the left lateral foot is callused over on presentation but requires debridement. The area on the right third med head plantar aspect actually is measuring smaller 02/28/18;patient is on Vanco and Fortaz at dialysis, I'm not sure why I thought this was Rocephin above unless it is been changed. I don't have a good sense of time frame here. Using silver collagen to the area on the left lateral foot and right third med head plantar foot 03/07/18; patient is completing  bank and Fortaz at dialysis soon. He is using Silver collagen to the area on the left lateral foot and the right third metatarsal head. He has a smaller superficial wound distal to the left lateral foot wound. 03/14/18-he is here in follow-up evaluation for multiple ulcerations to his bilateral feet. He presents with a new ulceration to the plantar aspect of the left foot underneath the blister, this was deroofed to reveal a partial thickness ulcer. He is voicing no complaints or concerns, tolerated dialysis yesterday. We will continue with same treatment plan and he will follow-up next week 03/21/18; the patient has  a area on the lateral left foot which still has a small probing area. The overall surface area of the wound is better. He presented with a new ulceration on the left foot plantar fifth metatarsal head last week. The area on the right third metatarsal head appears smaller.using silver alginate to all wound areas. The patient is changing the dressing himself. He is using a Darco forefoot offloading are on the right and a healing sandal on the left 03/28/18; original wound left lateral foot. Still nowhere close to looking like a heeling surface oosecondary wound on the left lateral foot at the base of his question fifth metatarsal which was new last week ooRight third metatarsal head. ooWe have been using silver alginate all wounds 04/04/18; the original wound on the left lateral foot again heavy callus surrounding thick nonviable subcutaneous tissue all requiring debridement. The new wound from 2 weeks ago just near this at the base of the fifth metatarsal head still looks about the same ooUnfortunately there is been deterioration in the third medical head wound on the right which now probes to bone. I must say this was a superficial wound at one point in time and I really don't have a good frame of reference year. I'll have to go back to look through records about what we know about the  right foot. 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 He is also been to see Dr. [name]. Somewhere in here as somebody is ordered a VASCULAR evaluation which was done on 03/22/18. On the right is anterior tibial artery was monophasic triphasic at the posterior tibial artery. On the left anterior tibial artery monophasic posterior tibial artery monophasic. ABI in the right was 1.43 on the left 1.21. TBIs on the right at 0.41 and on the left at 0.32. A vascular consult was recommended and I think has been arranged. 04/11/18; Mr. Moates has not had an MRI of the right foot since 2017. Recent x-ray of the right foot done in January and February was negative. However he has had a major deterioration in the wound over the third met head. He is completed antibiotics last week at dialysis [place]. I think he is going to see vascular in follow-up. The areas on the left lateral foot and left plantar fifth metatarsal head both look satisfactory. I debrided both of these areas although the tissue here looks good. The area on the left lateral foot once probe to bone it certainly does not do that now 04/18/18; MRI of the right foot is on Thursday. He has what looks to be serosanguineous purulent drainage coming out of the wound over the right third metatarsal head today. He completed antibiotics bank and Fortaz 2 weeks ago at dialysis. He has a vascular follow-up with regards to his arterial insufficiency although I don't exactly see when that is booked. The areas on the left foot including the lateral left foot and the plantar left fifth metatarsal head look about the same. 04/25/18-He is here in follow-up evaluation for bilateral foot ulcers. MRI obtained was negative for osteomyelitis. Wound culture was negative. We will continue with same treatment plan he'll follow-up next week 05/02/18; the right plantar foot  wound over the third metatarsal head actually looks better than when I last saw this. His MRI was negative for osteomyelitis wound culture was negative. ooOn the left plantar foot both wounds on the plantar fifth  metatarsal head and on the lateral foot both are covered in a very hard circumferential callus. 05/09/18; right plantar foot wound over the third metatarsal head stable from last week. ooLeft plantar foot wound over the fifth metatarsal head also stable but with callus around both wound areas ooThe area on the left lateral foot had thick callus over a surface and I had some thoughts about leaving this intact however it felt boggy. ooWe've been using silver alginate all wounds 05/16/18; since the patient was last here he was hospitalized from 5/15 through 5/19. 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 This grew MRSA. He was placed on vancomycin.MRI of the left foot suggested "progressive" osteomyelitis and bone destruction of the cuboid and the base of the fifth metatarsal. Stable erosive changes of the base of the second metatarsal. I'm wondering if they're aware that he had surgery and debridement in the area of the underlying bone previously by Dr. [name]. In any case, He was also revascularized with an angioplasty and stenting of the left tibial peroneal trunk and angioplasty of the left posterior tibial artery. He is on vancomycin at dialysis. He has a 2 week follow-up with infectious disease. He has vascular surgery follow-up. He was started on Plavix. 05/23/18; the patient's wound on the lateral left foot at the level of the fifth metatarsal head and the plantar wound on the plantar fifth metatarsal head both look better. The area on the right third metatarsal head still has depth and undermining. We've been using silver alginate. He is on vancomycin. He has been revascularized on  the left 05/30/18; he continues on vancomycin at dialysis. Revascularized on the left. The area on the left lateral foot is just about closed. Unfortunately the area over the plantar fifth metatarsal head undermining superiorly as does the area over the right third metatarsal head. Both of these significantly deteriorated from last week. He has appointments next week with infectious disease and orthopedics I delayed putting on a cast on the left until those appointments which therefore we made we'll bring him in on Friday the 14th with the idea of a cast on the left foot 06/06/18; he continues on vancomycin at dialysis. Revascularized on the left. He arrives today with a ballotable swelling just above the area on the left lateral foot where his previous wound was. He has no pain but he is reasonably insensate. The difficult areas on the plantar left fifth metatarsal head looks stable whereas the area on the right third plantar metatarsal head about the same as last week there is undermining here although I did not unroofed this today. He required a considerable debridement of the swelling on the left lateral foot area and I unroofed an abscess with a copious amount of brown purulent material which I obtained for culture. I don't believe during his recent hospitalization he had any further imaging although I need to review this. Previous cultures from this area done in this clinic showed MRSA, I would be surprised if this is not what this is currently even though he is on vancomycin. 06/13/18; he continues on vancomycin at dialysis however he finishes this on Sunday and then graduates the doxycycline previously prescribed by Dr. [name]. The abscess site on the lateral foot that I unroofed last time grew moderate amounts of methicillin-resistant staph aureus that is both vancomycin and tetracycline sensitive so we should be  okay from that regard. He arrives today in clinic with a connection between the  abscess site and the area on the lateral foot. We've been using silver alginate to all his wound areas. The right third metatarsal head wound is measuring smaller. Using silver alginate on both wound areas 06/20/18; transitioning to doxycycline prescribed by Dr. [name]. The abscess site on the lateral foot that I unroofed 2 weeks ago grew MRSA. That area has largely closed down although the lateral part of his wound on the fifth metatarsal head still probes to bone. I suspect all of this was connected. ooOn the right third metatarsal head smaller looking wound but surrounded by nonviable tissue that once again requires debridement using silver alginate on both areas 06/27/18; on doxycycline 100 twice a day. The abscess on the lateral foot has closed down. Although he still has the wound on the left fifth metatarsal head that extends towards the lateral part of the foot. The most lateral part of this wound probes to bone ooRight third metatarsal head still a deep probing wound with undermining. Nevertheless I elected not to debridement this this week ooIf everything is copious static next week on the left I'm going to attempt a total contact cast 07/04/18; he is tolerating his doxycycline. The abscess on the left lateral foot is closed down although there is still a deep wound here that probes to bone. We will use silver collagen under the total contact cast Silver collagen to the deep wound over the right third metatarsal head is well 07/11/18; he is tolerating doxycycline. The left lateral fifth plantar metatarsal head still probes deeply but I could not probe any bone. We've been using silver collagen and this will be the second week under the total contact cast ooSilver collagen to deep wound over the right third metatarsal head as well 07/18/18; he is still taking doxycycline. The left lateral fifth plantar metatarsal this week probes deeply with a large amount of exposed bone. Quite  a deterioration. He required extensive debridement. Specimens of bone for pathology and CNS obtained ooAlso considerable debridement on the right third metatarsal head 07/25/18; bone for pathology last week from the lateral left foot showed osteomyelitis. CandS of the bone Enterobacter. And Klebsiella. He is now on ciprofloxacin and the doxycycline is stopped [Dr. [name] of infectious disease] area He has an appointment with Dr. [name] tomorrow I'm going to leave the total contact cast off for this week. May wish to try to reapply that next week or the week after depending on the wound bed looks. I'm not sure if there is an operative option here, previously is followed with Dr. [name] 08/01/18 on evaluation today patient presents for reevaluation. He has been seen by infectious disease, Dr. [name], and he has placed the patient back on Ceftaz currently to be given to him at dialysis three times a week. 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 Fortunately there does not appear to be the evidence of infection at this point in time. Overall the patient has been tolerating the dressing changes without complication. We are no longer due lives in the cast of left foot secondary to the infection obviously. 08/10/18 on evaluation today patient appears to be doing rather well all things considered in regard to his left plantar foot ulcer. He does have a significant callous on the lateral portion of his left foot he wonders if I can help clean  that away to some degree today. Fortunately he is having no evidence of infection at this time which is good news. No fevers chills noted. With that being said his right plantar foot actually does have a significant callous buildup around the wound opening this seems to not be doing as well as I would like at this point. I do believe that he would benefit from sharp debridement at this  site. Subsequently I think a total contact cast would be helpful for the right foot as well. 08/17/18; the patient arrived today with a total contact cast on the right foot. This actually looks quite good. He had gone to see Dr. Orvan Falconer of infectious disease about the osteomyelitis on the left foot. I think he is on IV Fortaz at dialysis although I am not exactly sure of the rationale for the Fortaz at the time of this dictation. He also arrived in today with a swelling on the lateral left foot this is the site of his original wounds in fact when I first saw this man when he was under Dr. Marcie Bal care I think this was the site of where the wound was located. It was not particularly tender however using a small scalpel I opened it to [place] some moderate amount of purulent drainage. 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 Last MRI was in May that showed progressive osteomyelitis and bone destruction of the cuboid and base of the fifth metatarsal. Last week he had an abscess over this same area this was removed. Culture was negative. ooWe are continuing with a total contact cast to the area on the third metatarsal head on the right and making some good progress here. The patient asked me again about renal transplant. He is not on the list because of the open wounds. 08/31/18; apparently the patient had an interruption in the [place] but he is now back on this at dialysis. Apparently there was a misunderstanding and Dr. Chaya Jan orders. Due to have the MRI of the left foot tonight. ooThe area on the right foot continues to have callus thick skin and subcutaneous tissue around the wound edge that requires constant debridement however the wound is smaller we are using a total contact cast in this area. Alginate all wounds 09/07/18; without much surprise the MRI of his left foot  showed osteomyelitis in the reminiscent of his fourth metatarsal but also the third metatarsal. She arrives in clinic today with the right foot and a total contact cast. There was purulent drainage coming out of the wound which I have cultured. Marked deterioration here with undermining widely around the wound orifice. Using pickups and a scalpel I remove callus and subcutaneous tissue from a substantial new opening. ooIn a similar fashion the area on the left lateral foot that was blistered last week I have opened this and remove skin and subcutaneous tissue from this area to expose a obvious new wound. I think there is extension and communication between all of this on the left foot. ooThe patient is on Fortaz at dialysis. Culture done of the right foot. We are clearly not making progress here. ooWe have made him an appointment with Dr. Luiz Blare of orthopedics. I think an amputation of the left leg may be discussed. I don't think there is anything that can be done with foot salvage. 09/14/18; culture from the right foot last  time showed a very resistant MRSA. This is resistant to doxycycline. I'm going to try to get linezolid at least for a week. He does not have a appointment with Dr. [name] yet. 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 I'll send a message to Dr. [name] of infectious disease. I want the patient to see Dr. [name] to go over the pros and cons of an amputation which I think will be a BKA. The patient had a question about whether this is curative or not. I told him that I thought it would be although spread of staph aureus infection is not unheard of. MRI of the right foot was done at the end of April 2019 did not show osteomyelitis. The patient last saw Dr. [name] on 9/3. I'll send Dr. [name] a message. I would really like him to weigh the pros and cons of a BKA on the left otherwise he'll probably need IV antibiotics and  perhaps hyperbaric oxygen again. He has not had a good response to this in the past. His MRI earlier this month showed progressive damage in the remnants of the fourth and third metatarsal 09/21/2018; sees Dr. [name] of orthopedics next week to discuss a left BKA in response to the osteomyelitis in the left foot. Using silver alginate to all wound areas. He is completing the Zyvox I did put in for him after last culture showed MRSA 09/29/2018; patient saw Dr. [name] of orthopedics discussed the osteomyelitis in the left foot third and fourth metatarsals. He did not recommend urgent surgery but certainly stated the only surgical option would be a BKA. He is communicating with Dr. [name]. The problem here is the instability of the areas on his foot which constantly generate draining abscesses. I will communicate with Dr. [name] about this. He has an appointment on 10/23 10/05/2018; patient sees Dr. [name] on 10/23. I have sent him a secure message not ordered any additional antibiotics for now. Patient continues to have 2 open areas on the left plantar foot at the fifth metatarsal head and the lateral aspect of the foot. These have not changed all that much. The area on the right third met head still has thick callus and surrounding subcutaneous tissue we have been using silver alginate 10/12/2018; patient sees Dr. [name] or colleague on 10/23. 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 The area on the right plantar foot is just about closed. The superior wound on the left fifth metatarsal head is callused over but I am not sure if this is closed. The area below it is about the same. Considerable amount of callus on the lateral foot. We have been using silver alginate to the wounds 10/19/2018; the patient saw Dr. [name] on 10/22. He wishes to try and continue to save the left foot. He has been given 6 weeks of oral ciprofloxacin. We  continue to put a total contact cast on the right foot. The area over the fifth metatarsal head on the left is callused over/perhaps healed but I did not remove the callus to find out. He still has the wound on the left lateral midfoot requiring debridement. We have been using silver alginate to all wound areas 10/26/2018 ooOn the left foot our intake nurse noted some purulent drainage from the inferior wound [currently the only one that is not callused over] ooOn the right foot even though  he is in total contact cast a considerable amount of thick black callus and surface eschar. On this side we have been using silver alginate under a total contact cast. oohe remains on ciprofloxacin as prescribed by Dr. [name] 11/02/2018; culture from last week which is done of the probing area on the left midfoot wound showed MRSA "few". I am going to need to contact Dr. [name] which I will do today I think he is going to need IV antibiotics again. The area on the left foot which was callused on the side is clearly separating today all of this was removed a copious amounts of callus and necrotic subcutaneous tissue. ooThe area on the right plantar foot actually remained healthy looking with a healthy granulated base. ooWe are using silver alginate to all wound areas 11/09/2018; unfortunately neither 1 of the patient's wounds areas looks at all satisfactory. On the left he has considerable necrotic debris from the plantar wound laterally over the foot. I removed copious amounts of material including callus skin and subcutaneous tissue. On the right foot he arrives with undermining and frankly purulent drainage. Specimen obtained for culture and debridement of the callused skin and subcutaneous tissue from around the circumference. Because of this I cannot put him back in a total contact cast but to be truthful we are really unfortunately not making a lot of progress I did put in a secure text message to Dr.  [name] wondering about the MRSA on the left foot. He suggested vancomycin although this is not started. I was left wondering if he expected me to send this into dialysis. Has we have more purulence on the right side wait for that result before calling dialysis 11/16/2018; I called dialysis earlier this week to get the vancomycin started. I think he got the first dose on Tuesday. The patient tells me that he fell earlier this week twisting his left foot and ankle and he is swelling. He continues to not look well. He had blood cultures done at dialysis on Tuesday he is not been informed of the results. 12/07/2018; the patient was admitted to hospital from 11/22/2018 through 12/02/2018. He underwent a left BKA. Apparently had staph sepsis. In the meantime being off the right foot this is closed over. He follows up with Dr. 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 Subsequent to this he actually had multiple falls in fact he tells me to following which calls the wound to be his apparently. He has been seeing Dr. [name] and his physician assistant in the interim. They actually did want him to come back for reevaluation to see if there's anything we can do for me wound care perspective the help this area to heal more appropriately. The patient states that he does not have a tremendous amount of pain which is good news. No fevers, chills, nausea, or vomiting noted at this time. We have gotten approval from Dr. [name] office had [place] for [place] to treat the patient for his stop wound. This is due to the fact that the patient was in the 90 day postop global. 1/21; the patient does not have an open wound on the right foot although he does have some pressure areas that will need to be padded when he is transferring. The dehisced surgical wound looks clean although there is some  undermining of 1.5 cm superiorly. We have been using calcium alginate  1/28; patient arrives in our clinic for review of the left BKA stump wound. This appears healthy but there is undermining. TheraSkin #1 2/11; TheraSkin #2 2/25; TheraSkin #3. Wound is measuring smaller 3/10; TheraSkin #4 wound is measuring smaller 3/24; TheraSkin #5 wound is measuring smaller and looks healthy. 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 He has no open area on the right foot although he does have some callused area over the original third metatarsal head wound. The third metatarsal is also subluxed on the right foot. I have warned him today that he cannot consider wearing a prosthesis for at least a month but he can go for measurements. He is going to need to keep the right foot padded is much as possible in his diabetic shoe indefinitely READMISSION 06/12/2019 [name] is a type II diabetic on dialysis. He has been in this clinic multiple times with wounds on his bilateral lower extremities. 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 He also has a history of wounds on his right foot but tells me that everything is going well here. He actually came in here with his prosthesis walking with a cane. We were all really quite gratified to see this He states over the last several weeks he has had a painful area at the tip of the left third finger. His dialysis shunt in his is in the left upper arm and this particularly hurts during dialysis when his fingers get numb and there is a lot of pain in the tip of his left third finger. I believe he is seen vascular surgery. He had a Doppler done to evaluate for a dialysis steal syndrome. He is going for a banding procedure 1 week from today towards the left AV fistula. I think if that is unsuccessful they will be looking at creating a new  shunt. He had a course of doxycycline when he which he finished about a week ago. He has been using Bactroban to the finger 6/25; the patient had his banding procedure and things seem to be going better. He is not having pain in his hand at dialysis. The area on the tip of his finger seems about closed. He did however have a paronychia I the last time I saw him. I have been advising him to use topical Bactroban washing the finger. Culture I did last time showed a few staph epidermidis which I was willing to dismiss as superficial skin contaminant. He arrives today with the finger wound looking better however the paronychia a seems worse 7/9; 2-week follow-up. He does not have an open wound on the tip of his third left finger. I thought he had an initial ischemic wound on the tip of the finger as well as a paronychia a medially. All of this appears to be closed. 8/6-Patient returns after 3 weeks for follow-up and has a right second met head plantar callus with a significant area of maceration hiding an ulcer ABI repeated today on right - 1.26 8/13-Patient returns at 1 week, the right second met head plantar wound appears to be slightly better, it is surrounded by the callus area we are using silver alginate 8/20; the patient came back to clinic 2 weeks ago with a wound on the right second metatarsal head. He apparently told me he developed callus in this area but  also went away from his custom shoes to a pair of running shoes. Using silver alginate. 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 There might be much we can do to offload this although the patient tells me he has a wheelchair that he is using to try and stay off the wound is much as possible 8/27; right second metatarsal head. Still small punched out area with thick callus and subcutaneous tissue around the wound. We have been using silver collagen. This is always been an issue with this man's wound especially on this  foot 9/3; right second met head. Still the same punched-out area with thick callus and subcutaneous tissue around the wound removing the circumference demonstrates repetitively undermining area. I have removed all the subcutaneous tissue associated with this. We have been using silver collagen 9/15; right second metatarsal head. Same punched-out thick callus and subcutaneous tissue around the wound area. Still requiring debridement we have been using silver collagen I changed back to silver alginate X-ray last time showed no evidence of osteomyelitis. Culture showed Klebsiella and he was started and Keflex apparently just 4 days ago 9/24; right second metatarsal head. He is completed the antibiotics I gave him for 10 days. Same punched-out thick callus and subcutaneous tissue around the area. Still requiring debridement. We have been using silver alginate. The area itself looks swollen and this could be just [place] that comes on this area with walking on a prosthesis on the left however I think he needs an MRI and I am going to order that today. There is not an option to offload this further 10/1; right second plantar metatarsal head. MRI booked for October 6. Generally looking better today using silver alginate 10/8; right second plantar metatarsal head. MRI that was done on 10/6 was negative for osteomyelitis noted prior amputations of the second and fourth toes at the [facility]. Prior amputation of the third toe to the base of the middle phalanx. Prior amputation of the fifth toe to the head of the metatarsal. 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 He does not recall about hearing about this. He did not have any pain but then again he has reduced sensation from diabetic neuropathy 10/15; right second plantar metatarsal head. Still in the same condition callus nonviable subcutaneous tissue which cleans up nicely with debridement but reforms by the next  week. The patient tells me that he is offloading this is much as he can including taking his wheelchair to dialysis. We have been using silver alginate, changed to silver collagen today 10/22; right second plantar metatarsal head. Wound measures smaller but still thick callus around this wound. Still requiring debridement 11/5 right second plantar metatarsal head. Less callus around the wound circumference but still requiring debridement. There is still some undermining which I cleaned out the wound looks healthy but still considerable punched out depth with a relatively small circumference to the wound there is no palpable bone no purulent drainage 11/12; right second plantar metatarsal head. Small wound with thick callus tissue around the wound and significant undermining. We have been using silver collagen after my continuous debridements of this area 11/19; patient arrives in today with purulent drainage coming out of the wound in the second third met head area on the right foot. Serosanguineous thick drainage. Specimen obtained for culture. 11/21/2019 on evaluation today patient appears to be doing well with regard to his foot ulcer. The good  news is is culture came back negative for any bacteria there was no growth. The other good news is his x-ray also appears to be doing well. The foot is also measuring much better than what it was. Overall I am very pleased with how things seem to be progressing. 12/22; patient was in [facility] for several weeks due to the death of a family member. He said he stayed off the wound using silver alginate. 01/03/2020. Patient has a small open area with roughly 0.3 cm of circumferential undermining. The orifice looks smaller. We have been using silver collagen. There is not a wound more aggressive way to offload this area as he has a prosthesis on the other leg 1/21; again the same small area is 2 weeks ago. We have been using silver collagen I change that  to endoform today I really believe that this is an offloading issue 2/4; the area on the right third met head. We have been using endoform. 2/11 the area on the right third met head we have been using silver alginate. 2/25; the area on the right third met head. There is much less callus on this today. The patient states he is offloading this even more aggressively. As an example he is taking his wheelchair into dialysis on most days 3/4; right third metatarsal head. Again he has eschar over the surface of the wound which I removed with a #3 curette. Some subcutaneous debris. This has 0.8 cm of direct probing depth. I cannot feel any bone here. I did do a culture the area 3/11; right third metatarsal head. Again an eschared area over the wound which almost makes this look superficial. Again debridement reveals a wound with probing depth probably not much different from last week there is no palpable bone no palpable purulence. The patient tells me that he is making every effort to offload this area properly. He is taking wheelchair into dialysis etc. There is no option for forefoot offloading or a total contact cast. We used [place]asis #1 today 3/18; again callus and eschar over the wound surface. I remove this but still a probing wound although only 3 mm in depth this time. This is an improvement 3/25; again callus and eschar over the wound surface which I removed the wound is much deeper today at 0.7 cm. There is no palpable bone. Because of the appearance of this a culture was done. I am not going to put the Oasis back in this. Concerned about underlying infection. Notable for the fact that he is just finished doxycycline for the last go round of this 3/30; still 0.7 cm in depth which is disappointing. Culture I gave last time showed a few Staph aureus which is methicillin susceptible. This should have been taken care of by the recent course of doxycycline I gave him. We are using silver alginate 4/6;  almost 1 cm in depth. We have been using silver alginate with underlying Bactroban. 4/15; about 1 cm in depth still. There is no palpable bone but there is undermining thick surface again as usual. Our intake nurse noted what looked to be purulent drainage I suppose that could have been Bactroban but I did a culture for CandS 4/20; depth down 0.7 cm. Culture from last time was negative we have been using Bactroban and silver alginate 4/29; depth down 0.5 cm. Using silver alginate. He reports some drainage 5/6; comes in with the wound worse. Wound is deeper and tunnels. Measures 0.9 depth and an undermining  area of 1.8 cm. 5/18; there is a change the dressing last week. Still using Sorbact hydrogel. What I can see of the base of the wound looks better. 6/1. Not much change. I changed him to endoform today. He has 1.1 cm in depth no palpable bone 6/8; again a small punched out wound with significant undermining. Culture I did last week was negative. T oday he had some purulent drainage perhaps mixed with some retained endoform. 6/22; MRI report below ooIMPRESSION: Skin ulceration on the plantar surface of the foot at the level of the head of the second metatarsal. Mild marrow edema in the head of the second metatarsal could be reactive or due to osteomyelitis. Thin fluid collection surrounding the head and neck of the second metatarsal appears to communicate with the patient's skin wound and is worrisome for a septic collection. Advanced first MTP osteoarthritis. Prior amputations as noted above. Electronically Signed By: Inge Rise M.D. On: 06/16/2020 11:51 PCR culture I did last time I believe showed peptostreptococcus. There was no comment on any other gram-negative or gram-positive organisms. I gave him a course of Flagyl which I think he is finishing up. We are not making any progress with the wound over the right second/third metatarsal head. We are using silver alginate putting  him in kerlix Coban 6/29; has his appointment with Dr. [name] of orthopedics next Tuesday I think we see him on the same day. He was in for a nurse visit today. Nurse noted undermining of skin and callus so I was asked to look at this. We have been using silver alginate and kerlix Coban 7/6; patient saw Dr. [name] PA today will see Dr. [name] on Tuesday. The major question is does he needs surgery to clean out the area in question culture question resect any involved bone etc. Otherwise he will need to see infectious disease for broad-spectrum antibiotics given at dialysis 7/13; I talked to Dr. [name] late last week. He was not impressed with the MRI results thinking that the bone changes may have been reactive rather than osteomyelitis. He did not think he needed drainage of an abscess thinking of the fluid we are seeing was superficial and contiguous with the actual wound. He did not think he needed surgery or resection. Recommended consideration of infectious disease and return him to our care. We have been using silver alginate 7/20; PCR culture I did last week showed staph aureus methicillin sensitive. I have him on twice daily Keflex. Silver alginate to the wound 7/27; I have continued his 500 every 12 of cephalexin with a second dose after dialysis. My intention is to give him 4 to 6 weeks of antibiotics directed at [facility] 07/31/2020 on evaluation today patient appears to be doing a little better in regard to his overall wound size. This is good news. He does have a fairly significant callus buildup still and I am can work on that today. Fortunately there is no signs of active infection at this time. He has been on Keflex and has an extension of that that he is going to pick up and continue taking. 08/07/20-Patient back at 1 week with right met head wound looking about the same, overall the callus removal has helped Objective Constitutional alert and oriented x 3. sitting or [name] blood  pressure is within target range for patient.. supine blood pressure is within target range for patient.. pulse regular and within target range for patient.Marland Kitchen respirations regular, non-labored and within target range for patient.Marland Kitchen temperature within  target range for patient.. Well- nourished and well-hydrated in no acute distress. Vitals Time Taken: 1:45 PM, Height: 73 in, Weight: 202 lbs, BMI: 26.6, Temperature: 98.8 F, Pulse: 89 bpm, Respiratory Rate: 18 breaths/min, Blood Pressure: 119/73 mmHg, Capillary Blood Glucose: 115 mg/dl. General Notes: patient stated CBG was 115 this morning General Notes: Right plantar met head wound small with very minimal callus around the rim Integumentary (Hair, Skin) Wound #41 status is Open. Original cause of wound was Gradually Appeared. The wound is located on the Right Metatarsal head second. The wound measures 0.4cm length x 0.3cm width x 0.9cm depth; 0.094cm^2 area and 0.085cm^3 volume. There is Fat Layer (Subcutaneous Tissue) Exposed exposed. There is no tunneling noted, however, there is undermining starting at 12:00 and ending at 12:00 with a maximum distance of 0.5cm. There is a small amount of serosanguineous drainage noted. The wound margin is thickened. There is large (67-100%) red, pink granulation within the wound bed. There is a small (1-33%) amount of necrotic tissue within the wound bed including Adherent Slough. 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 Dressing Change Frequency: Wound #41 Right Metatarsal head second: Do not change entire dressing for one week. Skin Barriers/Peri-Wound Care: Moisturizing lotion Wound Cleansing: May shower with protection. - patient  to use a cast protector Primary Wound Dressing: Wound #41 Right Metatarsal head second: Calcium Alginate with Silver Secondary Dressing: Wound #41 Right Metatarsal head second: Dry Gauze Other: - felt callous pad Edema Control: Kerlix and Coban - Right Lower Extremity - cotton cast padding instead of kerlix Avoid [name] for long periods of time Elevate legs to the level of the heart or above for 30 minutes daily and/or when sitting, a frequency of: - throughout the day Additional Orders / Instructions: Other: - minimize walking and [name] on foot to aid in wound healing and callus buildup. -Continue using silver alginate, with T pad, elfa -Patient asked to minimize being on this foot for any length of time -Return to clinic next week Electronic Signature(s) Signed: 08/07/2020 2:38:07 PM By: Tobi Bastos MD, MBA Entered By: Tobi Bastos on 08/07/2020 14:38:07 -------------------------------------------------------------------------------- SuperBill Details Patient Name: Date of Service: Kateri Plummer. 08/07/2020 Medical Record Number: [id number] Patient Account Number: [id number] Date of Birth/Sex: Treating RN: February 24, 1951 (69 y.o. [name] Primary Care Provider: [name] Other Clinician: Referring Provider: Treating Provider/Extender: Raiford Noble in Treatment: 60 Diagnosis Coding ICD-10 Codes Code Description E11.621 Type 2 diabetes mellitus with foot ulcer L97.512 Non-pressure chronic ulcer of other part of right foot with fat layer exposed I70.298 Other atherosclerosis of native arteries of extremities, other extremity L03.115 Cellulitis of right lower limb Facility Procedures Physician Procedures : CPT4 Code Description Modifier [id number] - WC PHYS LEVEL 3 - EST PT ICD-10 Diagnosis Description E11.621 Type 2 diabetes mellitus with foot ulcer Quantity: 1 Electronic Signature(s) Signed: 08/07/2020 5:03:09 PM By: Tobi Bastos MD, MBA Signed: 08/08/2020 5:19:54 PM By: Levan Hurst RN, BSN Previous Signature: 08/07/2020 2:38:26 PM Version By: Tobi Bastos MD, MBA Entered By: Levan Hurst on 08/07/2020 14:39:20

## 2020-08-14 ENCOUNTER — Encounter (HOSPITAL_BASED_OUTPATIENT_CLINIC_OR_DEPARTMENT_OTHER): Payer: Medicare Other | Admitting: Internal Medicine

## 2020-08-14 DIAGNOSIS — E11621 Type 2 diabetes mellitus with foot ulcer: Secondary | ICD-10-CM | POA: Diagnosis not present

## 2020-08-14 NOTE — Progress Notes (Signed)
[name], [name] ([id number]) Visit Report for 08/14/2020 Debridement Details Patient Name: Date of Service: [name], [name] 08/14/2020 3:00 PM Medical Record Number: [id number] Patient Account Number: [id number] Date of Birth/Sex: Treating RN: 1951/06/05 (69 y.o. [name] Primary Care Provider: Kristie [name] Other Clinician: Referring Provider: Treating Provider/Extender: [name] in Treatment: 61 Debridement Performed for Assessment: Wound #41 Right Metatarsal head second Performed By: Physician [name]., MD Debridement Type: Debridement Severity of Tissue Pre Debridement: Fat layer exposed Level of Consciousness (Pre-procedure): Awake and Alert Pre-procedure Verification/Time Out Yes - 16:10 Taken: Start Time: 16:10 T Area Debrided (L x W): otal 1 (cm) x 1 (cm) = 1 (cm) Tissue and other material debrided: Non-Viable, Callus Level: Non-Viable Tissue Debridement Description: Selective/Open Wound Instrument: Blade Bleeding: Minimum Hemostasis Achieved: Pressure End Time: 16:12 Procedural Pain: 0 Post Procedural Pain: 0 Response to Treatment: Procedure was tolerated well Level of Consciousness (Post- Awake and Alert procedure): Post Debridement Measurements of Total Wound Length: (cm) 0.2 Width: (cm) 0.2 Depth: (cm) 0.3 Volume: (cm) 0.009 Character of Wound/Ulcer Post Debridement: Improved Severity of Tissue Post Debridement: Fat layer exposed Post Procedure Diagnosis Same as Pre-procedure Electronic Signature(s) Signed: 08/14/2020 5:36:57 PM By: [name] Hurst RN, BSN Signed: 08/14/2020 5:57:44 PM By: [name] Ham MD Entered By: [name] on 08/14/2020 17:19:11 -------------------------------------------------------------------------------- HPI Details Patient Name: Date of Service: [name] Hones EL B. 08/14/2020 3:00 PM Medical Record Number: [id number] Patient Account Number: [id number] Date of Birth/Sex:  Treating RN: 04/11/1951 (69 y.o. [name] Primary Care Provider: Kristie [name] Other Clinician: Referring Provider: Treating Provider/Extender: [name] in Treatment: 61 History of Present Illness Location: Patient presents with a wound to bilateral feet. Quality: Patient reports experiencing essentially no pain. Severity: Mildly severe wound with no evidence of infection Duration: Patient has had the wound for greater than 2 weeks prior to presenting for treatment HPI Description: Stomatitis cyst the patient is a pleasant 69 yrs old bm here for evaluation of ulcers on the plantar aspect of both feet. 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 He has a left arm graft for access. He has been trying to stay off his feet for weeks but does not seem to have any improvement in the wound. He has been seeing someone at the foot center and was referred to the Wound Care center for further evaluation. 12/30/15 the patient has 3 wounds one over the right first metatarsal head and 2 on the left foot at the left fifth and lleft first metatarsal head. All of these look relatively similar. The one over the left fifth probe to bone I could not prove that any of the others did. I note his MRI in November that did not show osteomyelitis. His peripheral pulses seem robust. All of these underwent surgical debridement to remove callus nonviable skin and subcutaneous tissue 01/06/16; the patient had his sutures removed from the right fifth ray amputation. There may be a small open part of this superiorly but otherwise the incision looks good. Areas over his right first, left first and left fifth metatarsal head all underwent surgical debridement as a varying degree of callus, skin and nonviable subcutaneous tissue. The area that is most worrisome is the right fifth metatarsal head which has a wound probes precariously close to  bone. There is no purulent drainage or erythema 01/13/16; I'm not exactly sure of the status of the right fifth  ray amputation site however he follows with Dr. [name] later this week. The area over his right first plantar metatarsal head, left first and fifth plantar metatarsal head are all in the same status. Thick circumferential callus, nonviable subcutaneous tissue. Culture of the left fifth did not culture last week 01/20/16; all of the patient's wounds appear and roughly the same state although his amputation site on the right lateral foot looks better. His wounds over the right first, left first and left fifth metatarsal heads all underwent difficult surgical debridement removing circumferential callus nonviable skin and subcutaneous tissue. There is no overt evidence of infection in these areas. MRI at the end of December of the left foot did not show osteomyelitis, right foot showed osteomyelitis of the right fifth digit he is now status post amputation. 01/27/16 the patient's wounds over his plantar first and fifth metatarsal heads on the left all appear better having been started on a total contact cast last week. They were debridement of circumferential callus and nonviable subcutaneous tissue as was the wound over the first metatarsal head. His surgical incision on the right also had some light surface debridement done. 03/23/2016 -- the patient was doing really well and most of his wounds had almost completely healed but now he came back today with a history of having a discharge from the area of his right foot on the plantar aspect and also between his first and second toe. He also has had some discharge from the left foot. Addendum: I spoke to the PA [name] at the dialysis center whose fax number is [phone]. We discussed the infection the patient has and she will put the patient on vancomycin and Fortaz until the final culture report is back. We will fax this as soon as  available. 03/30/2016 -- left foot x-ray IMPRESSION:No definitive osteomyelitis noted. X-ray of the right foot -- IMPRESSION: 1. Soft tissue swelling. Prior amputation right fifth digit. No acute or focal bony abnormality identified. If osteomyelitis remains a clinical concern, MRI can be obtained. 2. Peripheral vascular disease. His culture reports have grown an MSSA -- and we will fax this report to his hemodialysis center. He was called in a prescription of oral doxycycline but I have told her not to fill this in as he is already on IV antibiotics. 04/06/2016 -- a few days ago, I spoke to the hemodialysis center nurse who had stopped the IV antibiotics and he was given a prescription for doxycycline 100 mg by mouth twice a day for a week and he is on this at the present time. 05/11/2016 -- he has recently seen his PCP this week and his hemoglobin A1c was 7. He is working on his paperwork to get his orthotic shoes. 05/25/2016 -- -- x-ray of the left foot IMPRESSION:No acute bony abnormality. No radiographic changes of acute osteomyelitis. No change since prior study. X-ray of the right foot -- IMPRESSION: Postsurgical changes are seen involving the fifth toe. No evidence of acute osteomyelitis. he developed a large blister on the medial part of his right foot and this opened out and drain fluid. 06/01/2016 -- he has his MRI to be done this afternoon. 06/15/2016 -- MRI of the left forefoot without contrast shows 2 separate regions of cutaneous and subcutaneous edema and possible ulceration and blistering along the ball of the foot. No obvious osteomyelitis identified. MRI of the right foot showed cutaneous and subcutaneous thickening plantar to the first digit sesamoid with an ulcer crater but no  underlying osteomyelitis is identified. 06/22/2016 -- the right foot plantar ulcer has been draining a lot of seropurulent material for the last few days. 06/30/2016 -- spoke to the dialysis center and I  believe I spoke to [place] a PA at the center who discussed with me and agreed to putting Legrand Como on vancomycin until his cultures arrive. On review of his culture report no WBCs were seen or no organisms were seen and the culture was reincubated for better growth. The final report is back and there were no predominant growth including Streptococcus or Staphylococcus. Clinically though he has a lot of drainage from both wounds a lot of undermining and there is further blebs on the left foot towards the interspace between his first and second toe. 08/10/2016 -- the culture from the right foot showed normal skin flora and there was no Staphylococcus aureus was group A streptococcus isolated. 09/21/2016 -- -- MRI of the right foot was done on 09/13/2016 - IMPRESSION: Findings most consistent with acute osteomyelitis throughout the great toe, sesamoid bones and plantar aspect of the head of the first metatarsal. Fluid in the sheath of the flexor tendon of the great toe could be sympathetic but is worrisome for septic tenosynovitis. First MTP joint effusion worrisome for septic joint. He was admitted to the hospital on 09/11/2016 and treated for a fever with vancomycin and cefepime. He was seen by Dr. Bobby Rumpf of infectious disease who recommended 6 weeks treatment with vancomycin and ceftazidime with his hemodialysis and to continue to see as in the wound clinic. Vascular consult was pending. The patient was discharged home on 09/14/2016 and he would continue with IV antibiotics for 6 weeks. 09/28/16 wound appears reasonably healthy. Continuing with total contact cast 10/05/16 wound is smaller and looking healthy. Continue with total contact cast. He continues on IV antibiotics 10/12/2016 -- he has developed a new wound on the dorsal aspect of his left big toe and this is a superficial injury with no surrounding cellulitis. He has completed 30 days of IV antibiotics and is now ready to start his  hyperbaric oxygen therapy as per his insurance company's recommendation. 10/19/2016 -- after the cast was removed on the right side he has got good resolution of his ulceration on the right plantar foot. he has a new wound on the left plantar foot in the region of his fourth metatarsal and this will need sharp debridement. 10/26/2016 -- Xray of the right foot complete: IMPRESSION: Changes consistent with osteomyelitis involving the head of the first metatarsal and base of the first proximal phalanx. The sesamoid bones are also likely involved given their positioning. 11/02/2016 -- there still awaiting insurance clearance for his hyperbaric oxygen therapy and hopefully he will begin treatment soon. 11/09/2016 -- started with hyperbaric oxygen therapy and had some barotrauma to the right ear and this was seen by ENT who was prescribed Afrin drops and would probably continue with HBO and he is scheduled for myringotomy tubes on Friday 11/16/2016 -- he had his myringotomy tubes placed on Friday and has been doing much better after that with some fluid draining out after hyperbaric treatment today. The pain was much better. 11/30/2016 -- over the last 2 days he noticed a swelling and change of color of his right second toe and this had been draining minimal fluid. 12/07/2016 -- x-ray of the right foot -- IMPRESSION:1. Progressive ulceration at the distal aspect of the second digit with significant soft tissue swelling and osseous changes in the distal  phalanx compatible with osteomyelitis. 2. Chronic osteomyelitis at the first MTP joint. 3. Ulcerations at the second and third toes as well without definite osseous changes. Osteomyelitis is not excluded. 4. Fifth digit amputation. On 12/01/2016 I spoke to Dr. Bobby Rumpf, the infectious disease specialist, who kindly agreed to treat this with IV antibiotics and he would call in the order to the dialysis center and this has been discussed in detail with  the patient who will make the appropriate arrangements. The patient will also book an appointment as soon as possible to see Dr. [name] in the office. Of note the patient has not been on antibiotics this entire week as the dialysis center did not receive any orders from Dr. [name]. I got in touch with Dr. [name] who tells me the patient has an appointment to see him this coming Wednesday. 12/14/2016 -- the patient is on ceftazidime and vancomycin during his dialysis, and I understand this was put on by his nephrologist Dr. Raliegh Ip possibly after speaking with infectious disease Dr. [name] 12/23/16; patient was on my schedule today for a wound evaluation as he had difficulties with his schedule earlier this week. I note that he is on vancomycin and ceftazidine at dialysis. In spite of this he arrives today with a new wound on the base of the right second toe this easily probes to bone. The known wound at the tip of the second toe With this area. He also has a superficial area on the medial aspect of the third toe on the side of the DIP. This does not appear to have much depth. The area on the plantar left foot is a deep area but did not probe the bone 12/28/16 -- he has brought in some lab work and the most recent labs done showed a hemoglobin of 12.1 hematocrit of 36.3, neutrophils of 51% WBC count of 5.6, BN of 53, albumin of 4.1 globulin of 3.7, vancomycin 13 g per mL. 01/04/2017 -- he saw Dr. [name] who has recommended a amputation of the right second toe and he is awaiting this date. He sees Dr. Bobby Rumpf of infectious disease tomorrow. The bone culture taken on 12/28/2016 had no growth in 2 days. 01/11/2017 -- was seen by Dr. Bobby Rumpf regarding the management and has recommended a eval by vascular surgeons. He recommended to continue the antibiotics during his hemodialysis. Xray of the left foot -- IMPRESSION: No acute fracture, dislocation, or osseous erosion identified. 01/18/2017 --  he has his vascular workup later today and he is going to have his right foot second toe amputation this coming Friday by Dr. [name]. We have also put in for a another 30 treatments with hyperbaric oxygen therapy 01/25/2017 -- I have reviewed Dr. Donnetta Hutching is vascular report from last week where he reviewed him and thought his vascular function was good enough to heal his amputation site and no further tests were recommended. He also had his orthopedic related to surgery which is still pending the notes and we will review these next week. 02/01/2017 -- the operative remote of Dr. 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 02/08/2017 --he was seen by Dr. [name] on 02/03/2017 -- patient  is on ceftaz and Vanco. after a thorough review he had recommended to continue with antibiotics during hemodialysis. The patient was seen by Dr. [name], but I do not find any follow-up note on the electronic medical record. he has removed the dressing over the right foot to remove the sutures and asked him to see him back in a month's time 02/22/2017 -- patient has been febrile and has been having symptoms of the upper respiratory tract infection but has not been checked for the flu and it's been over 4 days now. He says he is feeling better today. Some drainage between his left first and second toe and this needed to be looked at 03/08/2017 -- was seen by Dr. Bobby Rumpf on 03/07/2017 after review he will stop his antibiotics and see him back in a month to see if he is feeling well. 03/15/2017 -- he has completed his course of hyperbaric oxygen therapy and is doing  fine with his health otherwise. 03/22/2017 -- his nutritionist at the dialysis center has recommended a protein supplement to help build his collagen and we will prescribe this for him when he has the details 05/03/2017 -- he recently noticed the area on the plantar aspect of his fifth metatarsal head which opened out and had minimal drainage. 05/10/2017 -- -- x-ray of the left foot -- IMPRESSION:1. No convincing conventional radiographic evidence of active osteomyelitis.2. Active soft tissue ulceration at the tip of the fifth digit, and at the lateral aspect of the foot adjacent the base of the fifth metatarsal. 3. Surgical changes of prior fourth toe amputation. 4. 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 5. Small vessel atherosclerotic vascular calcifications. 6. 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 ======= Readmission after 5 weeks: 06/15/2017 -- the patient returns after 5 weeks having had an MRI done on 05/17/2017 MRI of the left foot without contrast showed soft tissue ulcer overlying the base of the fifth metatarsal. 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 No drainable fluid collection to suggest an abscess. He was in hospital between 05/16/2017 and 05/25/2017 -- and on discharge was asked to follow-up with Dr. [name] and Dr. [name]. He was treated 2 weeks post discharge with vancomycin plus ceftriaxone with hemodialysis and oral metronidazole 500 mg 3 times a day. 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 last hemoglobin A1c was 6.7% He had a postoperative MR of the foot on 05/23/2017 -- which showed no new areas of cortical bone loss, edema or soft tissue ulceration to suggest osteomyelitis. the patient has completed his course of IV antibiotics during dialysis and is to see  the infectious disease doctor tomorrow. Dr. [name] had seen him after suture removal and asked him to keep the wound with a dry dressing 06/21/2017 -- he was seen by Dr. Bobby Rumpf of infectious disease on 06/16/2017 -- he stopped his Flagyl and will see him back in 6 weeks. He was asked to follow-up with Dr. [name] and with the wound clinic clinic. 07/05/17; bone biopsy from last time showed acute osteomyelitis. This is from the reminiscent in the left fifth metatarsal. Culture result is apparently still pending [holding for anaerobe}. From my understanding in this case this is been a progressive necrotic wound which is deteriorated markedly over the last 3 weeks since he returned here. He now has  a large area of exposed bone which was biopsied and cultured last week. Dr. Graylon Good has put him on vancomycin and Fortaz during his hemodialysis and Flagyl orally. He is to see Dr. [name] next week 07/19/2017 -- the patient was reviewed by Dr. [name] of orthopedics who reviewed the case in detail and agreed with the plan to continue with IV antibiotics, aggressive wound care and hyperbaric oxygen therapy. He would see him back in 3 weeks' time 08/09/2017 -- saw Dr. Bobby Rumpf on 08/03/2017 -he was restarted on his antibiotics for 6 weeks which included vanco, ceftaz and Flagyl. he recommended continuing his antibiotics for 6 weeks and reevaluate his completion date. He would continue with wound care and hyperbaric oxygen therapy. 08/16/2017 -- I understand he will be completing his 6 weeks of antibiotics sometime later this week. 09/19/2017 -- he has been without his wound VAC for the last week due to lack of supplies. He has been packing his wound with silver alginate. 10/04/2017 -- he has an appointment with Dr. [name] tomorrow and hence we will not apply the wound VAC after his dressing changes today. 10/11/2017 -- he was seen by Dr. Bobby Rumpf on 10/05/2017, noted that the patient was  currently off antibiotics, and after thorough review he recommended he follow-up with the wound care as per plan and no further antibiotics at this point. 11/29/17; patient is arrived for the wound on his left lateral foot.Marland Kitchen He is completed IV antibiotics and 2 rounds of hyperbaric oxygen for treatment of underlying osteomyelitis. He arrives today with a surface on most of the wound area however our intake nurse noted drainage from the superior aspect. This was brought to my attention. 12/13/2017 -- the right plantar foot had a large bleb and once the bleb was opened out a large callus and subcutaneous debris was removed and he has a plantar ulcer near the fourth metatarsal head 12/28/17 on evaluation today patient appears to be doing acutely worse in regard to his left foot. The wound which has been appearing to do better as now open up more deeply there is bone palpable at the base of the wound unfortunately. He tells me that this feels "like it did when he had osteomyelitis previously" he also noted that his second toe on the left foot appears to be doing worse and is swollen there does appear to be some fluid collected underneath. His right foot plantar ulcer appears to be doing somewhat better at this point and there really is no complication at the site currently. No fevers, chills, nausea, or vomiting noted at this time. Patient states that he normally has no pain at this site however. T oday he is having significant pain. 01/03/18; culture done last week showed methicillin sensitive staph aureus and group B strep. He was on Septra however he arrives today with a fever of 101. He has a functional dialysis shunt in his left arm but has had no pain here. No cough. He still makes some urine no dysuria he did have abdominal pain nausea and diarrhea over the weekend but is not had any diarrhea since yesterday. Otherwise he has no specific complaints 01/05/18; the patient returns today in follow-up for his  presentation from 1/8. At that point he was febrile. I gave him some Levaquin adjusted for his dialysis status. He tells me the fever broke that night and it is not likely that this was actually a wound infection. He is gone on to have an MRI of  the left foot; this showed interval development of an abnormal signal from the base of the third and fourth metatarsal and in the cuboid reminiscent consistent with osteomyelitis. There is no mention of the left second toe I think which was a concern when it was ordered. The patient is taking his Levaquin 01/10/18; the patient has completed his antibiotics today. The area on the left lateral foot is smaller but it still probes easily to bone. He has underlying osteomyelitis here and sees Dr. Megan Salon of infectious disease this week The area on the right third plantar metatarsal head still requires debridement not much change in dimensions He has a new wound on the medial tip of his right third toe again this probes to bone. He only noticed this 2 days ago 01/17/18; the patient saw Dr. [name] last week and he is back on Vanco and Fortaz at dialysis. 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 We are previously seeing him for a wound also on the base of the fourth med head on the right. X-ray of this area did not show osteomyelitis in relation to a new wound on the tip of his right third toe. Again this probes to bone. Finally his second toe on the left which didn't really show anything on the MRI at least no report appears to have a separated cutaneous area which I think is going to come right off the tip of his toe and leave exposed bone. Whether this is infectious or ischemic I am not clear Culture I did from the third toe last week which was his new wound was negative. Plain x-ray on the right foot did not show osteomyelitis in the toes 01/24/18; the  patient is going went to see Dr. [name]. He is going to have a amputation of the left second and right third toe although paradoxically the left second toe looks better than last week. We have treating a deep probing wound on the left lateral foot and the area over the third metatarsal head on the right foot. 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 Also a debridement including bone of the left lateral foot and a closure which is still sutured. This is a bit surprising. Also apparent debridement of the base of the right third toe wound. 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 I'm not sure exactly where we are in the frame of things here. The wound on the left lateral foot is still sutured also the amputation sites of the left second and right third toe. He is been using Xeroform to the sutured areas silver alginate to the right foot 02/14/18; he continues on Rocephin at dialysis for osteomyelitis. I still don't have a good sense of where we are in the treatment duration. The wounds on the left lateral foot had the sutures removed and this clearly still probes deeply. We debrided the area with a #5 curet. We'll use silver alginate to the wound the area over the right third met head also required debridement of callus skin and subcutaneous tissue and necrotic debris over the wound surface. This tunnels superiorly but I did not unroofed  this today. The areas on the tip of his toes have a crusted surface eschar I did not debridement this either. 02/21/18; he continues on Rocephin at dialysis for osteomyelitis. I don't have a good sense of where we are and the treatment duration. The wounds on the left lateral foot is callused over on presentation but requires debridement.  The area on the right third med head plantar aspect actually is measuring smaller 02/28/18;patient is on Vanco and Fortaz at dialysis, I'm not sure why I thought this was Rocephin above unless it is been changed. I don't have a good sense of time frame here. Using silver collagen to the area on the left lateral foot and right third med head plantar foot 03/07/18; patient is completing bank and Fortaz at dialysis soon. He is using Silver collagen to the area on the left lateral foot and the right third metatarsal head. He has a smaller superficial wound distal to the left lateral foot wound. 03/14/18-he is here in follow-up evaluation for multiple ulcerations to his bilateral feet. He presents with a new ulceration to the plantar aspect of the left foot underneath the blister, this was deroofed to reveal a partial thickness ulcer. He is voicing no complaints or concerns, tolerated dialysis yesterday. We will continue with same treatment plan and he will follow-up next week 03/21/18; the patient has a area on the lateral left foot which still has a small probing area. The overall surface area of the wound is better. He presented with a new ulceration on the left foot plantar fifth metatarsal head last week. The area on the right third metatarsal head appears smaller.using silver alginate to all wound areas. The patient is changing the dressing himself. He is using a Darco forefoot offloading are on the right and a healing sandal on the left 03/28/18; original wound left lateral foot. Still nowhere close to looking like a heeling surface secondary wound on the left lateral foot at the base of his question fifth metatarsal which was new last week Right third metatarsal head. We have been using silver alginate all wounds 04/04/18; the original wound on the left lateral foot again heavy callus surrounding thick nonviable subcutaneous tissue all requiring debridement. The new wound from 2 weeks ago just near this at  the base of the fifth metatarsal head still looks about the same Unfortunately there is been deterioration in the third medical head wound on the right which now probes to bone. I must say this was a superficial wound at one point in time and I really don't have a good frame of reference year. I'll have to go back to look through records about what we know about the right foot. 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 He is also been to see Dr. [name]. Somewhere in here as somebody is ordered a VASCULAR evaluation which was done on 03/22/18. On the right is anterior tibial artery was monophasic triphasic at the posterior tibial artery. On the left anterior tibial artery monophasic posterior tibial artery monophasic. ABI in the right was 1.43 on the left 1.21. TBIs on the right at 0.41 and on the left at 0.32. A vascular consult was recommended and I think has been arranged. 04/11/18; Mr. [name] has not had an MRI of the right foot since 2017. Recent x-ray of the right foot done in January and February was negative. However he has had a  major deterioration in the wound over the third met head. He is completed antibiotics last week at dialysis [place]. I think he is going to see vascular in follow-up. The areas on the left lateral foot and left plantar fifth metatarsal head both look satisfactory. I debrided both of these areas although the tissue here looks good. The area on the left lateral foot once probe to bone it certainly does not do that now 04/18/18; MRI of the right foot is on Thursday. He has what looks to be serosanguineous purulent drainage coming out of the wound over the right third metatarsal head today. He completed antibiotics bank and Fortaz 2 weeks ago at dialysis. He has a vascular follow-up with regards to his arterial insufficiency although I don't exactly see when that is  booked. The areas on the left foot including the lateral left foot and the plantar left fifth metatarsal head look about the same. 04/25/18-He is here in follow-up evaluation for bilateral foot ulcers. MRI obtained was negative for osteomyelitis. Wound culture was negative. We will continue with same treatment plan he'll follow-up next week 05/02/18; the right plantar foot wound over the third metatarsal head actually looks better than when I last saw this. His MRI was negative for osteomyelitis wound culture was negative. On the left plantar foot both wounds on the plantar fifth metatarsal head and on the lateral foot both are covered in a very hard circumferential callus. 05/09/18; right plantar foot wound over the third metatarsal head stable from last week. Left plantar foot wound over the fifth metatarsal head also stable but with callus around both wound areas The area on the left lateral foot had thick callus over a surface and I had some thoughts about leaving this intact however it felt boggy. We've been using silver alginate all wounds 05/16/18; since the patient was last here he was hospitalized from 5/15 through 5/19. 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 This grew MRSA. He was placed on vancomycin.MRI of the left foot suggested "progressive" osteomyelitis and bone destruction of the cuboid and the base of the fifth metatarsal. Stable erosive changes of the base of the second metatarsal. I'm wondering if they're aware that he had surgery and debridement in the area of the underlying bone previously by Dr. [name]. In any case, He was also revascularized with an angioplasty and stenting of the left tibial peroneal trunk and angioplasty of the left posterior tibial artery. He is on vancomycin at dialysis. He has a 2 week follow-up with infectious disease. He has vascular surgery  follow-up. He was started on Plavix. 05/23/18; the patient's wound on the lateral left foot at the level of the fifth metatarsal head and the plantar wound on the plantar fifth metatarsal head both look better. The area on the right third metatarsal head still has depth and undermining. We've been using silver alginate. He is on vancomycin. He has been revascularized on the left 05/30/18; he continues on vancomycin at dialysis. Revascularized on the left. The area on the left lateral foot is just about closed. Unfortunately the area over the plantar fifth metatarsal head undermining superiorly as does the area over the right third metatarsal head. Both of these significantly deteriorated from last week. He has appointments next week with infectious disease and orthopedics I delayed putting on a cast on the left until those appointments which  therefore we made we'll bring him in on Friday the 14th with the idea of a cast on the left foot 06/06/18; he continues on vancomycin at dialysis. Revascularized on the left. He arrives today with a ballotable swelling just above the area on the left lateral foot where his previous wound was. He has no pain but he is reasonably insensate. The difficult areas on the plantar left fifth metatarsal head looks stable whereas the area on the right third plantar metatarsal head about the same as last week there is undermining here although I did not unroofed this today. He required a considerable debridement of the swelling on the left lateral foot area and I unroofed an abscess with a copious amount of brown purulent material which I obtained for culture. I don't believe during his recent hospitalization he had any further imaging although I need to review this. Previous cultures from this area done in this clinic showed MRSA, I would be surprised if this is not what this is currently even though he is on vancomycin. 06/13/18; he continues on vancomycin at dialysis however he  finishes this on Sunday and then graduates the doxycycline previously prescribed by Dr. [name]. The abscess site on the lateral foot that I unroofed last time grew moderate amounts of methicillin-resistant staph aureus that is both vancomycin and tetracycline sensitive so we should be okay from that regard. He arrives today in clinic with a connection between the abscess site and the area on the lateral foot. We've been using silver alginate to all his wound areas. The right third metatarsal head wound is measuring smaller. Using silver alginate on both wound areas 06/20/18; transitioning to doxycycline prescribed by Dr. [name]. The abscess site on the lateral foot that I unroofed 2 weeks ago grew MRSA. That area has largely closed down although the lateral part of his wound on the fifth metatarsal head still probes to bone. I suspect all of this was connected. On the right third metatarsal head smaller looking wound but surrounded by nonviable tissue that once again requires debridement using silver alginate on both areas 06/27/18; on doxycycline 100 twice a day. The abscess on the lateral foot has closed down. Although he still has the wound on the left fifth metatarsal head that extends towards the lateral part of the foot. The most lateral part of this wound probes to bone Right third metatarsal head still a deep probing wound with undermining. Nevertheless I elected not to debridement this this week If everything is copious static next week on the left I'm going to attempt a total contact cast 07/04/18; he is tolerating his doxycycline. The abscess on the left lateral foot is closed down although there is still a deep wound here that probes to bone. We will use silver collagen under the total contact cast Silver collagen to the deep wound over the right third metatarsal head is well 07/11/18; he is tolerating doxycycline. The left lateral fifth plantar metatarsal head still probes deeply but I could  not probe any bone. We've been using silver collagen and this will be the second week under the total contact cast Silver collagen to deep wound over the right third metatarsal head as well 07/18/18; he is still taking doxycycline. The left lateral fifth plantar metatarsal this week probes deeply with a large amount of exposed bone. Quite a deterioration. He required extensive debridement. Specimens of bone for pathology and CNS obtained Also considerable debridement on the right third metatarsal head 07/25/18; bone for  pathology last week from the lateral left foot showed osteomyelitis. CandS of the bone Enterobacter. And Klebsiella. He is now on ciprofloxacin and the doxycycline is stopped [Dr. [name] of infectious disease] area He has an appointment with Dr. [name] tomorrow I'm going to leave the total contact cast off for this week. May wish to try to reapply that next week or the week after depending on the wound bed looks. I'm not sure if there is an operative option here, previously is followed with Dr. [name] 08/01/18 on evaluation today patient presents for reevaluation. He has been seen by infectious disease, Dr. [name], and he has placed the patient back on Ceftaz currently to be given to him at dialysis three times a week. 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 Fortunately there does not appear to be the evidence of infection at this point in time. Overall the patient has been tolerating the dressing changes without complication. We are no longer due lives in the cast of left foot secondary to the infection obviously. 08/10/18 on evaluation today patient appears to be doing rather well all things considered in regard to his left plantar foot ulcer. He does have a significant callous on the lateral portion of his left foot he wonders if I can help clean that away to some degree today. Fortunately he is having no  evidence of infection at this time which is good news. No fevers chills noted. With that being said his right plantar foot actually does have a significant callous buildup around the wound opening this seems to not be doing as well as I would like at this point. I do believe that he would benefit from sharp debridement at this site. Subsequently I think a total contact cast would be helpful for the right foot as well. 08/17/18; the patient arrived today with a total contact cast on the right foot. This actually looks quite good. He had gone to see Dr. Megan Salon of infectious disease about the osteomyelitis on the left foot. I think he is on IV Fortaz at dialysis although I am not exactly sure of the rationale for the Fortaz at the time of this dictation. He also arrived in today with a swelling on the lateral left foot this is the site of his original wounds in fact when I first saw this man when he was under Dr. Ardeen Garland care I think this was the site of where the wound was located. It was not particularly tender however using a small scalpel I opened it to [place] some moderate amount of purulent drainage. 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 Last MRI was in May that showed progressive osteomyelitis and bone destruction of the cuboid and base of the fifth metatarsal. Last week he had an abscess over this same area this was removed. Culture was negative. We are continuing with a total contact cast to the area on the third metatarsal head on the right and making some good progress here. The patient asked me again about renal transplant. He is not on the list because of the open wounds. 08/31/18; apparently the patient had an interruption in the [place] but he is now back on this at dialysis. Apparently there was a misunderstanding and Dr. Crissie Figures orders. Due to have the MRI of  the  left foot tonight. The area on the right foot continues to have callus thick skin and subcutaneous tissue around the wound edge that requires constant debridement however the wound is smaller we are using a total contact cast in this area. Alginate all wounds 09/07/18; without much surprise the MRI of his left foot showed osteomyelitis in the reminiscent of his fourth metatarsal but also the third metatarsal. She arrives in clinic today with the right foot and a total contact cast. There was purulent drainage coming out of the wound which I have cultured. Marked deterioration here with undermining widely around the wound orifice. Using pickups and a scalpel I remove callus and subcutaneous tissue from a substantial new opening. In a similar fashion the area on the left lateral foot that was blistered last week I have opened this and remove skin and subcutaneous tissue from this area to expose a obvious new wound. I think there is extension and communication between all of this on the left foot. The patient is on Fortaz at dialysis. Culture done of the right foot. We are clearly not making progress here. We have made him an appointment with Dr. [name] of orthopedics. I think an amputation of the left leg may be discussed. I don't think there is anything that can be done with foot salvage. 09/14/18; culture from the right foot last time showed a very resistant MRSA. This is resistant to doxycycline. I'm going to try to get linezolid at least for a week. He does not have a appointment with Dr. [name] yet. 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 I'll send a message to Dr. [name] of infectious disease. I want the patient to see Dr. [name] to go over the pros and cons of an amputation which I think will be a BKA. The patient had a question about whether this is curative or not. I told him that I thought it would be although spread of staph  aureus infection is not unheard of. MRI of the right foot was done at the end of April 2019 did not show osteomyelitis. The patient last saw Dr. [name] on 9/3. I'll send Dr. [name] a message. I would really like him to weigh the pros and cons of a BKA on the left otherwise he'll probably need IV antibiotics and perhaps hyperbaric oxygen again. He has not had a good response to this in the past. His MRI earlier this month showed progressive damage in the remnants of the fourth and third metatarsal 09/21/2018; sees Dr. [name] of orthopedics next week to discuss a left BKA in response to the osteomyelitis in the left foot. Using silver alginate to all wound areas. He is completing the Zyvox I did put in for him after last culture showed MRSA 09/29/2018; patient saw Dr. [name] of orthopedics discussed the osteomyelitis in the left foot third and fourth metatarsals. He did not recommend urgent surgery but certainly stated the only surgical option would be a BKA. He is communicating with Dr. [name]. The problem here is the instability of the areas on his foot which constantly generate draining abscesses. I will communicate with Dr. [name] about this. He has an appointment on 10/23 10/05/2018; patient sees Dr. [name] on 10/23. I have sent him a secure message not ordered any additional antibiotics for now. Patient continues to have 2 open areas on the left plantar foot at the fifth metatarsal head and the lateral aspect of  the foot. These have not changed all that much. The area on the right third met head still has thick callus and surrounding subcutaneous tissue we have been using silver alginate 10/12/2018; patient sees Dr. [name] or colleague on 10/23. 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 The area on the right plantar foot is just about closed. The superior wound on the left fifth metatarsal head is callused over but I am not sure  if this is closed. The area below it is about the same. Considerable amount of callus on the lateral foot. We have been using silver alginate to the wounds 10/19/2018; the patient saw Dr. [name] on 10/22. He wishes to try and continue to save the left foot. He has been given 6 weeks of oral ciprofloxacin. We continue to put a total contact cast on the right foot. The area over the fifth metatarsal head on the left is callused over/perhaps healed but I did not remove the callus to find out. He still has the wound on the left lateral midfoot requiring debridement. We have been using silver alginate to all wound areas 10/26/2018 On the left foot our intake nurse noted some purulent drainage from the inferior wound [currently the only one that is not callused over] On the right foot even though he is in total contact cast a considerable amount of thick black callus and surface eschar. On this side we have been using silver alginate under a total contact cast. he remains on ciprofloxacin as prescribed by Dr. [name] 11/02/2018; culture from last week which is done of the probing area on the left midfoot wound showed MRSA "few". I am going to need to contact Dr. [name] which I will do today I think he is going to need IV antibiotics again. The area on the left foot which was callused on the side is clearly separating today all of this was removed a copious amounts of callus and necrotic subcutaneous tissue. The area on the right plantar foot actually remained healthy looking with a healthy granulated base. We are using silver alginate to all wound areas 11/09/2018; unfortunately neither 1 of the patient's wounds areas looks at all satisfactory. On the left he has considerable necrotic debris from the plantar wound laterally over the foot. I removed copious amounts of material including callus skin and subcutaneous tissue. On the right foot he arrives with undermining and frankly purulent drainage.  Specimen obtained for culture and debridement of the callused skin and subcutaneous tissue from around the circumference. Because of this I cannot put him back in a total contact cast but to be truthful we are really unfortunately not making a lot of progress I did put in a secure text message to Dr. [name] wondering about the MRSA on the left foot. He suggested vancomycin although this is not started. I was left wondering if he expected me to send this into dialysis. Has we have more purulence on the right side wait for that result before calling dialysis 11/16/2018; I called dialysis earlier this week to get the vancomycin started. I think he got the first dose on Tuesday. The patient tells me that he fell earlier this week twisting his left foot and ankle and he is swelling. He continues to not look well. He had blood cultures done at dialysis on Tuesday he is not been informed of the results. 12/07/2018; the patient was admitted to hospital from 11/22/2018 through 12/02/2018.  He underwent a left BKA. Apparently had staph sepsis. In the meantime being off the right foot this is closed over. He follows up with Dr. 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 Subsequent to this he actually had multiple falls in fact he tells me to following which calls the wound to be his apparently. He has been seeing Dr. [name] and his physician assistant in the interim. They actually did want him to come back for reevaluation to see if there's anything we can do for me wound care perspective the help this area to heal more appropriately. The patient states that he does not have a tremendous amount of pain which is good news. No fevers, chills, nausea, or vomiting noted at this time. We have gotten approval from Dr. [name] office had [place] for [place] to treat the patient for his stop wound. This  is due to the fact that the patient was in the 90 day postop global. 1/21; the patient does not have an open wound on the right foot although he does have some pressure areas that will need to be padded when he is transferring. The dehisced surgical wound looks clean although there is some undermining of 1.5 cm superiorly. 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 This appears healthy but there is undermining. TheraSkin #1 2/11; TheraSkin #2 2/25; TheraSkin #3. Wound is measuring smaller 3/10; TheraSkin #4 wound is measuring smaller 3/24; TheraSkin #5 wound is measuring smaller and looks healthy. 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 He has no open area on the right foot although he does have some callused area over the original third metatarsal head wound. The third metatarsal is also subluxed on the right foot. I have warned him today that he cannot consider wearing a prosthesis for at least a month but he can go for measurements. He is going to need to keep the right foot padded is much as possible in his diabetic shoe indefinitely READMISSION 06/12/2019 Mr. [name] is a type II diabetic on dialysis. He has been in this clinic multiple times with wounds on his bilateral lower extremities. 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 He also has a history of wounds on his right foot but tells me that everything is going well here. He actually came in here with his prosthesis walking with a cane. We were all really quite gratified to see this He states over the last several weeks he has had a painful area at the tip of the left third finger. His dialysis shunt in his is in the left upper arm and this particularly hurts during dialysis when his fingers get numb and there is a lot of pain in the  tip of his left third finger. I believe he is seen vascular surgery. He had a Doppler done to evaluate for a dialysis steal syndrome. He is going for a banding procedure 1 week from today towards the left AV fistula. I think if that is unsuccessful they will be looking at creating a new shunt. He had a course of doxycycline when he which he finished about a week ago. He has been using Bactroban to the  finger 6/25; the patient had his banding procedure and things seem to be going better. He is not having pain in his hand at dialysis. The area on the tip of his finger seems about closed. He did however have a paronychia I the last time I saw him. I have been advising him to use topical Bactroban washing the finger. Culture I did last time showed a few staph epidermidis which I was willing to dismiss as superficial skin contaminant. He arrives today with the finger wound looking better however the paronychia a seems worse 7/9; 2-week follow-up. He does not have an open wound on the tip of his third left finger. I thought he had an initial ischemic wound on the tip of the finger as well as a paronychia a medially. All of this appears to be closed. 8/6-Patient returns after 3 weeks for follow-up and has a right second met head plantar callus with a significant area of maceration hiding an ulcer ABI repeated today on right - 1.26 8/13-Patient returns at 1 week, the right second met head plantar wound appears to be slightly better, it is surrounded by the callus area we are using silver alginate 8/20; the patient came back to clinic 2 weeks ago with a wound on the right second metatarsal head. He apparently told me he developed callus in this area but also went away from his custom shoes to a pair of running shoes. Using silver alginate. 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 There might be much we can do to offload this although the patient tells me he has a wheelchair that he is  using to try and stay off the wound is much as possible 8/27; right second metatarsal head. Still small punched out area with thick callus and subcutaneous tissue around the wound. We have been using silver collagen. This is always been an issue with this man's wound especially on this foot 9/3; right second met head. Still the same punched-out area with thick callus and subcutaneous tissue around the wound removing the circumference demonstrates repetitively undermining area. I have removed all the subcutaneous tissue associated with this. We have been using silver collagen 9/15; right second metatarsal head. Same punched-out thick callus and subcutaneous tissue around the wound area. Still requiring debridement we have been using silver collagen I changed back to silver alginate X-ray last time showed no evidence of osteomyelitis. Culture showed Klebsiella and he was started and Keflex apparently just 4 days ago 9/24; right second metatarsal head. He is completed the antibiotics I gave him for 10 days. Same punched-out thick callus and subcutaneous tissue around the area. Still requiring debridement. We have been using silver alginate. The area itself looks swollen and this could be just [place] that comes on this area with walking on a prosthesis on the left however I think he needs an MRI and I am going to order that today. There is not an option to offload this further 10/1; right second plantar metatarsal head. MRI booked for October 6. Generally looking better today using silver alginate 10/8; right second plantar metatarsal head. MRI that was done on 10/6 was negative for osteomyelitis noted prior amputations of the second and fourth toes at the [facility]. Prior amputation of the third toe to the base of the middle phalanx. Prior amputation of the fifth toe to the head of the metatarsal. 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  He does not recall  about hearing about this. He did not have any pain but then again he has reduced sensation from diabetic neuropathy 10/15; right second plantar metatarsal head. Still in the same condition callus nonviable subcutaneous tissue which cleans up nicely with debridement but reforms by the next week. The patient tells me that he is offloading this is much as he can including taking his wheelchair to dialysis. We have been using silver alginate, changed to silver collagen today 10/22; right second plantar metatarsal head. Wound measures smaller but still thick callus around this wound. Still requiring debridement 11/5 right second plantar metatarsal head. Less callus around the wound circumference but still requiring debridement. There is still some undermining which I cleaned out the wound looks healthy but still considerable punched out depth with a relatively small circumference to the wound there is no palpable bone no purulent drainage 11/12; right second plantar metatarsal head. Small wound with thick callus tissue around the wound and significant undermining. We have been using silver collagen after my continuous debridements of this area 11/19; patient arrives in today with purulent drainage coming out of the wound in the second third met head area on the right foot. Serosanguineous thick drainage. Specimen obtained for culture. 11/21/2019 on evaluation today patient appears to be doing well with regard to his foot ulcer. The good news is is culture came back negative for any bacteria there was no growth. The other good news is his x-ray also appears to be doing well. The foot is also measuring much better than what it was. Overall I am very pleased with how things seem to be progressing. 12/22; patient was in [facility] for several weeks due to the death of a family member. He said he stayed off the wound using silver alginate. 01/03/2020. Patient has a small open area with roughly 0.3 cm of  circumferential undermining. The orifice looks smaller. We have been using silver collagen. There is not a wound more aggressive way to offload this area as he has a prosthesis on the other leg 1/21; again the same small area is 2 weeks ago. We have been using silver collagen I change that to endoform today I really believe that this is an offloading issue 2/4; the area on the right third met head. We have been using endoform. 2/11 the area on the right third met head we have been using silver alginate. 2/25; the area on the right third met head. There is much less callus on this today. The patient states he is offloading this even more aggressively. As an example he is taking his wheelchair into dialysis on most days 3/4; right third metatarsal head. Again he has eschar over the surface of the wound which I removed with a #3 curette. Some subcutaneous debris. This has 0.8 cm of direct probing depth. I cannot feel any bone here. I did do a culture the area 3/11; right third metatarsal head. Again an eschared area over the wound which almost makes this look superficial. Again debridement reveals a wound with probing depth probably not much different from last week there is no palpable bone no palpable purulence. The patient tells me that he is making every effort to offload this area properly. He is taking wheelchair into dialysis etc. There is no option for forefoot offloading or a total contact cast. We used Oasis #1 today 3/18; again callus and eschar over the wound surface. I remove this but still a probing wound although  only 3 mm in depth this time. This is an improvement 3/25; again callus and eschar over the wound surface which I removed the wound is much deeper today at 0.7 cm. There is no palpable bone. Because of the appearance of this a culture was done. I am not going to put the Oasis back in this. Concerned about underlying infection. Notable for the fact that he is just finished  doxycycline for the last go round of this 3/30; still 0.7 cm in depth which is disappointing. Culture I gave last time showed a few Staph aureus which is methicillin susceptible. This should have been taken care of by the recent course of doxycycline I gave him. We are using silver alginate 4/6; almost 1 cm in depth. We have been using silver alginate with underlying Bactroban. 4/15; about 1 cm in depth still. There is no palpable bone but there is undermining thick surface again as usual. Our intake nurse noted what looked to be purulent drainage I suppose that could have been Bactroban but I did a culture for CandS 4/20; depth down 0.7 cm. Culture from last time was negative we have been using Bactroban and silver alginate 4/29; depth down 0.5 cm. Using silver alginate. He reports some drainage 5/6; comes in with the wound worse. Wound is deeper and tunnels. Measures 0.9 depth and an undermining area of 1.8 cm. 5/18; there is a change the dressing last week. Still using Sorbact hydrogel. What I can see of the base of the wound looks better. 6/1. Not much change. I changed him to endoform today. He has 1.1 cm in depth no palpable bone 6/8; again a small punched out wound with significant undermining. Culture I did last week was negative. T oday he had some purulent drainage perhaps mixed with some retained endoform. 6/22; MRI report below IMPRESSION: Skin ulceration on the plantar surface of the foot at the level of the head of the second metatarsal. Mild marrow edema in the head of the second metatarsal could be reactive or due to osteomyelitis. Thin fluid collection surrounding the head and neck of the second metatarsal appears to communicate with the patient's skin wound and is worrisome for a septic collection. Advanced first MTP osteoarthritis. Prior amputations as noted above. Electronically Signed By: Inge Rise M.D. On: 06/16/2020 11:51 PCR culture I did last time I believe  showed peptostreptococcus. There was no comment on any other gram-negative or gram-positive organisms. I gave him a course of Flagyl which I think he is finishing up. We are not making any progress with the wound over the right second/third metatarsal head. We are using silver alginate putting him in kerlix Coban 6/29; has his appointment with Dr. [name] of orthopedics next Tuesday I think we see him on the same day. He was in for a nurse visit today. Nurse noted undermining of skin and callus so I was asked to look at this. We have been using silver alginate and kerlix Coban 7/6; patient saw Dr. [name] PA today will see Dr. [name] on Tuesday. The major question is does he needs surgery to clean out the area in question culture question resect any involved bone etc. Otherwise he will need to see infectious disease for broad-spectrum antibiotics given at dialysis 7/13; I talked to Dr. [name] late last week. He was not impressed with the MRI results thinking that the bone changes may have been reactive rather than osteomyelitis. He did not think he needed drainage of an abscess thinking of  the fluid we are seeing was superficial and contiguous with the actual wound. He did not think he needed surgery or resection. Recommended consideration of infectious disease and return him to our care. We have been using silver alginate 7/20; PCR culture I did last week showed staph aureus methicillin sensitive. I have him on twice daily Keflex. Silver alginate to the wound 7/27; I have continued his 500 every 12 of cephalexin with a second dose after dialysis. My intention is to give him 4 to 6 weeks of antibiotics directed at [facility] 07/31/2020 on evaluation today patient appears to be doing a little better in regard to his overall wound size. This is good news. He does have a fairly significant callus buildup still and I am can work on that today. Fortunately there is no signs of active infection at this time. He has  been on Keflex and has an extension of that that he is going to pick up and continue taking. 08/07/20-Patient back at 1 week with right met head wound looking about the same, overall the callus removal has helped 8/19; right third met head. Again extensive callus removal with a 10 scalpel. There is a small remaining wound we have been using silver alginate Patient also comes in having a fall and an abrasion on his left elbow just distally. This is a clean wound and should heal with simple measures Electronic Signature(s) Signed: 08/14/2020 5:57:44 PM By: [name] Ham MD Entered By: [name] on 08/14/2020 17:21:35 -------------------------------------------------------------------------------- Physical Exam Details Patient Name: Date of Service: [name] Hones EL B. 08/14/2020 3:00 PM Medical Record Number: [id number] Patient Account Number: [id number] Date of Birth/Sex: Treating RN: 12/09/1951 (69 y.o. [name] Primary Care Provider: Kristie [name] Other Clinician: Referring Provider: Treating Provider/Extender: [name] in Treatment: 61 Constitutional Sitting or standing Blood Pressure is within target range for patient.. Pulse regular and within target range for patient.Marland Kitchen Respirations regular, non-labored and within target range.. Temperature is normal and within the target range for the patient.. Notes Wound exam; right plantar third metatarsal head. Thick callus over the wound area which I removed with a #10 blade. Superficial open area beneath this but with good surrounding skin. He also has an abrasion injury on the left dorsal forearm just below the elbow. This is clean and superficial. Electronic Signature(s) Signed: 08/14/2020 5:57:44 PM By: [name] Ham MD Entered By: [name] on 08/14/2020 17:27:44 -------------------------------------------------------------------------------- Physician Orders Details Patient Name: Date of  Service: [name] Hones EL B. 08/14/2020 3:00 PM Medical Record Number: [id number] Patient Account Number: [id number] Date of Birth/Sex: Treating RN: 14-Jun-1951 (69 y.o. 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 Dressing Change Frequency Wound #41 Right Metatarsal head second Do not change entire dressing for one week. Wound #42 Left Forearm Change dressing every day. Skin Barriers/Peri-Wound Care Moisturizing lotion Wound Cleansing May shower with protection. - patient to use a cast protector Primary Wound Dressing Wound #41 Right Metatarsal head second Calcium Alginate with Silver Wound #42 Left Forearm Other: - Triple Antibiotic Ointment Secondary Dressing Wound #41 Right Metatarsal head second  Dry Gauze Other: - felt callous pad Wound #42 Left Forearm Foam Border - or bandaid Edema Control Kerlix and Coban - Right Lower Extremity - cotton cast padding instead of kerlix Avoid standing for long periods of time Elevate legs to the level of the heart or above for 30 minutes daily and/or when sitting, a frequency of: - throughout the day Additional Orders / Instructions Other: - minimize walking and standing on foot to aid in wound healing and callus buildup. Electronic Signature(s) Signed: 08/14/2020 5:36:57 PM By: [name] Hurst RN, BSN Signed: 08/14/2020 5:57:44 PM By: [name] Ham MD Entered By: [name] on 08/14/2020  16:15:45 -------------------------------------------------------------------------------- Problem List Details Patient Name: Date of Service: BRECKAN, CAFIERO EL B. 08/14/2020 3:00 PM Medical Record Number: [id number] Patient Account Number: [id number] Date of Birth/Sex: Treating RN: Apr 09, 1951 (69 y.o. 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, other extremity 06/12/2019 No Yes L03.115 Cellulitis of right lower limb 11/15/2019 No Yes S50.312D Abrasion of left elbow, subsequent encounter 08/14/2020 No Yes Inactive Problems ICD-10 Code Description Active Date Inactive Date L03.114 Cellulitis of left upper limb 06/12/2019 06/12/2019 L98.498 Non-pressure chronic ulcer of skin of other sites with other specified severity 06/12/2019 06/12/2019 S61.209D Unspecified open wound of unspecified finger without damage to nail, subsequent 06/12/2019 06/12/2019 encounter Resolved Problems Electronic Signature(s) Signed: 08/14/2020 5:57:44 PM By: [name] Ham MD Entered By: [name] on 08/14/2020 17:19:44 -------------------------------------------------------------------------------- Progress Note Details Patient Name: Date of Service: [name] Hones EL B. 08/14/2020 3:00 PM Medical Record Number: [id number] Patient Account Number: [id number] Date of Birth/Sex: Treating RN: [birth year] (69 y.o. [name] Primary Care Provider: Kristie [name] Other Clinician: Referring Provider: Treating Provider/Extender: [name] in Treatment:  61 Subjective History of Present Illness (HPI) The following HPI elements were documented for the patient's wound: Location: Patient presents with a wound to bilateral feet. Quality: Patient reports experiencing essentially no pain. Severity: Mildly severe wound with no evidence of infection Duration: Patient has had the wound for greater than 2 weeks prior to presenting for treatment Stomatitis cyst the patient is a pleasant 69 yrs old bm here for evaluation of ulcers on the plantar aspect of both feet. 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 He has a left arm graft for access. He has been trying to stay off his feet for weeks but does not seem to have any improvement in the wound. He has been seeing someone at the foot center and was referred to the Wound Care center for further evaluation. 12/30/15 the patient has 3 wounds one over the right first metatarsal head and 2 on the left foot at the left fifth and lleft first metatarsal head. All of these look relatively similar. The one over the left fifth probe to bone I could not prove that any of the others did. I note his MRI in November that did not show osteomyelitis. His peripheral pulses seem robust. All of these underwent surgical debridement to remove callus nonviable skin and subcutaneous tissue 01/06/16; the patient had his sutures removed from the right fifth ray amputation. There may be  a small open part of this superiorly but otherwise the incision looks good. Areas over his right first, left first and left fifth metatarsal head all underwent surgical debridement as a varying degree of callus, skin and nonviable subcutaneous tissue. The area that is most worrisome is the right fifth metatarsal head which has a wound probes precariously close to bone. There is no purulent drainage or erythema 01/13/16; I'm not exactly sure of the status of the right fifth ray amputation site however  he follows with Dr. [name] later this week. The area over his right first plantar metatarsal head, left first and fifth plantar metatarsal head are all in the same status. Thick circumferential callus, nonviable subcutaneous tissue. Culture of the left fifth did not culture last week 01/20/16; all of the patient's wounds appear and roughly the same state although his amputation site on the right lateral foot looks better. His wounds over the right first, left first and left fifth metatarsal heads all underwent difficult surgical debridement removing circumferential callus nonviable skin and subcutaneous tissue. There is no overt evidence of infection in these areas. MRI at the end of December of the left foot did not show osteomyelitis, right foot showed osteomyelitis of the right fifth digit he is now status post amputation. 01/27/16 the patient's wounds over his plantar first and fifth metatarsal heads on the left all appear better having been started on a total contact cast last week. They were debridement of circumferential callus and nonviable subcutaneous tissue as was the wound over the first metatarsal head. His surgical incision on the right also had some light surface debridement done. 03/23/2016 -- the patient was doing really well and most of his wounds had almost completely healed but now he came back today with a history of having a discharge from the area of his right foot on the plantar aspect and also between his first and second toe. He also has had some discharge from the left foot. Addendum: I spoke to the PA [name] at the dialysis center whose fax number is [phone]. We discussed the infection the patient has and she will put the patient on vancomycin and Fortaz until the final culture report is back. We will fax this as soon as available. 03/30/2016 -- left foot x-ray IMPRESSION:No definitive osteomyelitis noted. X-ray of the right foot -- IMPRESSION: 1. Soft tissue  swelling. Prior amputation right fifth digit. No acute or focal bony abnormality identified. If osteomyelitis remains a clinical concern, MRI can be obtained. 2. Peripheral vascular disease. His culture reports have grown an MSSA -- and we will fax this report to his hemodialysis center. He was called in a prescription of oral doxycycline but I have told her not to fill this in as he is already on IV antibiotics. 04/06/2016 -- a few days ago, I spoke to the hemodialysis center nurse who had stopped the IV antibiotics and he was given a prescription for doxycycline 100 mg by mouth twice a day for a week and he is on this at the present time. 05/11/2016 -- he has recently seen his PCP this week and his hemoglobin A1c was 7. He is working on his paperwork to get his orthotic shoes. 05/25/2016 -- -- x-ray of the left foot IMPRESSION:No acute bony abnormality. No radiographic changes of acute osteomyelitis. No change since prior study. X-ray of the right foot -- IMPRESSION: Postsurgical changes are seen involving the fifth toe. No evidence of acute osteomyelitis. he developed a large blister  on the medial part of his right foot and this opened out and drain fluid. 06/01/2016 -- he has his MRI to be done this afternoon. 06/15/2016 -- MRI of the left forefoot without contrast shows 2 separate regions of cutaneous and subcutaneous edema and possible ulceration and blistering along the ball of the foot. No obvious osteomyelitis identified. MRI of the right foot showed cutaneous and subcutaneous thickening plantar to the first digit sesamoid with an ulcer crater but no underlying osteomyelitis is identified. 06/22/2016 -- the right foot plantar ulcer has been draining a lot of seropurulent material for the last few days. 06/30/2016 -- spoke to the dialysis center and I believe I spoke to [place] a PA at the center who discussed with me and agreed to putting Legrand Como on vancomycin until his cultures arrive. On  review of his culture report no WBCs were seen or no organisms were seen and the culture was reincubated for better growth. The final report is back and there were no predominant growth including Streptococcus or Staphylococcus. Clinically though he has a lot of drainage from both wounds a lot of undermining and there is further blebs on the left foot towards the interspace between his first and second toe. 08/10/2016 -- the culture from the right foot showed normal skin flora and there was no Staphylococcus aureus was group A streptococcus isolated. 09/21/2016 -- -- MRI of the right foot was done on 09/13/2016 - IMPRESSION: Findings most consistent with acute osteomyelitis throughout the great toe, sesamoid bones and plantar aspect of the head of the first metatarsal. Fluid in the sheath of the flexor tendon of the great toe could be sympathetic but is worrisome for septic tenosynovitis. First MTP joint effusion worrisome for septic joint. He was admitted to the hospital on 09/11/2016 and treated for a fever with vancomycin and cefepime. He was seen by Dr. Bobby Rumpf of infectious disease who recommended 6 weeks treatment with vancomycin and ceftazidime with his hemodialysis and to continue to see as in the wound clinic. Vascular consult was pending. The patient was discharged home on 09/14/2016 and he would continue with IV antibiotics for 6 weeks. 09/28/16 wound appears reasonably healthy. Continuing with total contact cast 10/05/16 wound is smaller and looking healthy. Continue with total contact cast. He continues on IV antibiotics 10/12/2016 -- he has developed a new wound on the dorsal aspect of his left big toe and this is a superficial injury with no surrounding cellulitis. He has completed 30 days of IV antibiotics and is now ready to start his hyperbaric oxygen therapy as per his insurance company's recommendation. 10/19/2016 -- after the cast was removed on the right side he has got  good resolution of his ulceration on the right plantar foot. he has a new wound on the left plantar foot in the region of his fourth metatarsal and this will need sharp debridement. 10/26/2016 -- Xray of the right foot complete: IMPRESSION: Changes consistent with osteomyelitis involving the head of the first metatarsal and base of the first proximal phalanx. The sesamoid bones are also likely involved given their positioning. 11/02/2016 -- there still awaiting insurance clearance for his hyperbaric oxygen therapy and hopefully he will begin treatment soon. 11/09/2016 -- started with hyperbaric oxygen therapy and had some barotrauma to the right ear and this was seen by ENT who was prescribed Afrin drops and would probably continue with HBO and he is scheduled for myringotomy tubes on Friday 11/16/2016 -- he had his myringotomy tubes placed  on Friday and has been doing much better after that with some fluid draining out after hyperbaric treatment today. The pain was much better. 11/30/2016 -- over the last 2 days he noticed a swelling and change of color of his right second toe and this had been draining minimal fluid. 12/07/2016 -- x-ray of the right foot -- IMPRESSION:1. Progressive ulceration at the distal aspect of the second digit with significant soft tissue swelling and osseous changes in the distal phalanx compatible with osteomyelitis. 2. Chronic osteomyelitis at the first MTP joint. 3. Ulcerations at the second and third toes as well without definite osseous changes. Osteomyelitis is not excluded. 4. Fifth digit amputation. On 12/01/2016 oo I spoke to Dr. Bobby Rumpf, the infectious disease specialist, who kindly agreed to treat this with IV antibiotics and he would call in the order to the dialysis center and this has been discussed in detail with the patient who will make the appropriate arrangements. The patient will also book an appointment as soon as possible to see Dr. [name] in  the office. Of note the patient has not been on antibiotics this entire week as the dialysis center did not receive any orders from Dr. [name]. I got in touch with Dr. [name] who tells me the patient has an appointment to see him this coming Wednesday. 12/14/2016 -- the patient is on ceftazidime and vancomycin during his dialysis, and I understand this was put on by his nephrologist Dr. Raliegh Ip possibly after speaking with infectious disease Dr. [name] 12/23/16; patient was on my schedule today for a wound evaluation as he had difficulties with his schedule earlier this week. I note that he is on vancomycin and ceftazidine at dialysis. In spite of this he arrives today with a new wound on the base of the right second toe this easily probes to bone. The known wound at the tip of the second toe With this area. He also has a superficial area on the medial aspect of the third toe on the side of the DIP. This does not appear to have much depth. The area on the plantar left foot is a deep area but did not probe the bone 12/28/16 -- he has brought in some lab work and the most recent labs done showed a hemoglobin of 12.1 hematocrit of 36.3, neutrophils of 51% WBC count of 5.6, BN of 53, albumin of 4.1 globulin of 3.7, vancomycin 13 g per mL. 01/04/2017 -- he saw Dr. [name] who has recommended a amputation of the right second toe and he is awaiting this date. He sees Dr. Bobby Rumpf of infectious disease tomorrow. The bone culture taken on 12/28/2016 had no growth in 2 days. 01/11/2017 -- was seen by Dr. Bobby Rumpf regarding the management and has recommended a eval by vascular surgeons. He recommended to continue the antibiotics during his hemodialysis. Xray of the left foot -- IMPRESSION: No acute fracture, dislocation, or osseous erosion identified. 01/18/2017 -- he has his vascular workup later today and he is going to have his right foot second toe amputation this coming Friday by Dr. [name]. We  have also put in for a another 30 treatments with hyperbaric oxygen therapy 01/25/2017 -- I have reviewed Dr. Donnetta Hutching is vascular report from last week where he reviewed him and thought his vascular function was good enough to heal his amputation site and no further tests were recommended. He also had his orthopedic related to surgery which is still pending the notes and we will review  these next week. 02/01/2017 -- the operative remote of Dr. 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 02/08/2017 --he was seen by Dr. [name] on 02/03/2017 -- patient is on [place]. after a thorough review he had recommended to continue with antibiotics during hemodialysis. The patient was seen by Dr. [name], but I do not find any follow-up note on the electronic medical record. he has removed the dressing over the right foot to remove the sutures and asked him to see him back in a month's time 02/22/2017 -- patient has been febrile and has been having symptoms of the upper respiratory tract infection but has not been checked for the flu and it's been over 4 days now. He says he is feeling better today. Some drainage between his left first and second toe and this needed to be looked at 03/08/2017 -- was seen by Dr. Bobby Rumpf on 03/07/2017 after review he will stop his antibiotics and see him back in a month to see if he is feeling well. 03/15/2017 -- he has completed his course of hyperbaric oxygen therapy and is doing fine with his health otherwise. 03/22/2017 -- his nutritionist at the dialysis center has recommended a protein supplement to help build  his collagen and we will prescribe this for him when he has the details 05/03/2017 -- he recently noticed the area on the plantar aspect of his fifth metatarsal head which opened out and had minimal drainage. 05/10/2017 -- -- x-ray of the left foot -- IMPRESSION:1. No convincing conventional radiographic evidence of active osteomyelitis.2. Active soft tissue ulceration at the tip of the fifth digit, and at the lateral aspect of the foot adjacent the base of the fifth metatarsal. 3. Surgical changes of prior fourth toe amputation. 4. 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 5. Small vessel atherosclerotic vascular calcifications. 6. 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 ======= Readmission after 5 weeks: 06/15/2017 -- the patient returns after 5 weeks having had an MRI done on 05/17/2017 MRI of the left foot without contrast showed soft tissue ulcer overlying the base of the fifth metatarsal. 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 No drainable fluid collection to suggest an abscess. He was in hospital between 05/16/2017 and 05/25/2017 -- and on discharge was asked to follow-up with Dr. [name] and Dr. [name]. He was treated 2 weeks post discharge with vancomycin plus ceftriaxone with hemodialysis and oral metronidazole 500 mg 3 times a day. 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 last hemoglobin A1c was 6.7% He had a postoperative MR of the foot on 05/23/2017 -- which showed no new areas of cortical bone loss, edema or soft tissue ulceration to suggest osteomyelitis. the patient has completed his course of IV antibiotics during dialysis and is to see the infectious disease doctor tomorrow. Dr. [name] had seen him after suture removal and asked him to keep the wound with a dry  dressing 06/21/2017 -- he was seen by Dr. Bobby Rumpf of infectious disease on 06/16/2017 -- he  stopped his Flagyl and will see him back in 6 weeks. He was asked to follow-up with Dr. [name] and with the wound clinic clinic. 07/05/17; bone biopsy from last time showed acute osteomyelitis. This is from the reminiscent in the left fifth metatarsal. Culture result is apparently still pending [holding for anaerobe}. From my understanding in this case this is been a progressive necrotic wound which is deteriorated markedly over the last 3 weeks since he returned here. He now has a large area of exposed bone which was biopsied and cultured last week. Dr. Graylon Good has put him on vancomycin and Fortaz during his hemodialysis and Flagyl orally. He is to see Dr. [name] next week 07/19/2017 -- the patient was reviewed by Dr. [name] of orthopedics who reviewed the case in detail and agreed with the plan to continue with IV antibiotics, aggressive wound care and hyperbaric oxygen therapy. He would see him back in 3 weeks' time 08/09/2017 -- saw Dr. Bobby Rumpf on 08/03/2017 -he was restarted on his antibiotics for 6 weeks which included vanco, ceftaz and Flagyl. he recommended continuing his antibiotics for 6 weeks and reevaluate his completion date. He would continue with wound care and hyperbaric oxygen therapy. 08/16/2017 -- I understand he will be completing his 6 weeks of antibiotics sometime later this week. 09/19/2017 -- he has been without his wound VAC for the last week due to lack of supplies. He has been packing his wound with silver alginate. 10/04/2017 -- he has an appointment with Dr. [name] tomorrow and hence we will not apply the wound VAC after his dressing changes today. 10/11/2017 -- he was seen by Dr. Bobby Rumpf on 10/05/2017, noted that the patient was currently off antibiotics, and after thorough review he recommended he follow-up with the wound care as per plan and no further  antibiotics at this point. 11/29/17; patient is arrived for the wound on his left lateral foot.Marland Kitchen He is completed IV antibiotics and 2 rounds of hyperbaric oxygen for treatment of underlying osteomyelitis. He arrives today with a surface on most of the wound area however our intake nurse noted drainage from the superior aspect. This was brought to my attention. 12/13/2017 -- the right plantar foot had a large bleb and once the bleb was opened out a large callus and subcutaneous debris was removed and he has a plantar ulcer near the fourth metatarsal head 12/28/17 on evaluation today patient appears to be doing acutely worse in regard to his left foot. The wound which has been appearing to do better as now open up more deeply there is bone palpable at the base of the wound unfortunately. He tells me that this feels "like it did when he had osteomyelitis previously" he also noted that his second toe on the left foot appears to be doing worse and is swollen there does appear to be some fluid collected underneath. His right foot plantar ulcer appears to be doing somewhat better at this point and there really is no complication at the site currently. No fevers, chills, nausea, or vomiting noted at this time. Patient states that he normally has no pain at this site however. T oday he is having significant pain. 01/03/18; culture done last week showed methicillin sensitive staph aureus and group B strep. He was on [place] however he arrives today with a fever of 101. He has a functional dialysis shunt in his left arm but has had no pain here. No cough. He still makes some urine no dysuria he  did have abdominal pain nausea and diarrhea over the weekend but is not had any diarrhea since yesterday. Otherwise he has no specific complaints 01/05/18; the patient returns today in follow-up for his presentation from 1/8. At that point he was febrile. I gave him some Levaquin adjusted for his dialysis status. He tells me the  fever broke that night and it is not likely that this was actually a wound infection. He is gone on to have an MRI of the left foot; this showed interval development of an abnormal signal from the base of the third and fourth metatarsal and in the cuboid reminiscent consistent with osteomyelitis. There is no mention of the left second toe I think which was a concern when it was ordered. The patient is taking his Levaquin 01/10/18; the patient has completed his antibiotics today. The area on the left lateral foot is smaller but it still probes easily to bone. He has underlying osteomyelitis here and sees Dr. Megan Salon of infectious disease this week ooThe area on the right third plantar metatarsal head still requires debridement not much change in dimensions [facility] has a new wound on the medial tip of his right third toe again this probes to bone. He only noticed this 2 days ago 01/17/18; the patient saw Dr. [name] last week and he is back on Vanco and Fortaz at dialysis. 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 We are previously seeing him for a wound also on the base of the fourth med head on the right. X-ray of this area did not show osteomyelitis in relation to a new wound on the tip of his right third toe. Again this probes to bone. Finally his second toe on the left which didn't really show anything on the MRI at least no report appears to have a separated cutaneous area which I think is going to come right off the tip of his toe and leave exposed bone. Whether this is infectious or ischemic I am not clear Culture I did from the third toe last week which was his new wound was negative. Plain x-ray on the right foot did not show osteomyelitis in the toes 01/24/18; the patient is going went to see Dr. [name]. He is going to have a amputation of the left second and right third toe although  paradoxically the left second toe looks better than last week. We have treating a deep probing wound on the left lateral foot and the area over the third metatarsal head on the right foot. 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 Also a debridement including bone of the left lateral foot and a closure which is still sutured. This is a bit surprising. Also apparent debridement of the base of the right third toe wound. 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 I'm not sure exactly where we are in the frame of things here. The wound on the left lateral foot is still sutured also the amputation sites of the left second and right third toe. He is been using Xeroform to the sutured areas silver alginate to the right foot 02/14/18; he continues on Rocephin at  dialysis for osteomyelitis. I still don't have a good sense of where we are in the treatment duration. The wounds on the left lateral foot had the sutures removed and this clearly still probes deeply. We debrided the area with a #5 curet. We'll use silver alginate to the wound oothe area over the right third met head also required debridement of callus skin and subcutaneous tissue and necrotic debris over the wound surface. This tunnels superiorly but I did not unroofed this today. ooThe areas on the tip of his toes have a crusted surface eschar I did not debridement this either. 02/21/18; he continues on Rocephin at dialysis for osteomyelitis. I don't have a good sense of where we are and the treatment duration. The wounds on the left lateral foot is callused over on presentation but requires debridement. The area on the right third med head plantar aspect actually is measuring smaller 02/28/18;patient is on Vanco and  Fortaz at dialysis, I'm not sure why I thought this was Rocephin above unless it is been changed. I don't have a good sense of time frame here. Using silver collagen to the area on the left lateral foot and right third med head plantar foot 03/07/18; patient is completing bank and Fortaz at dialysis soon. He is using Silver collagen to the area on the left lateral foot and the right third metatarsal head. He has a smaller superficial wound distal to the left lateral foot wound. 03/14/18-he is here in follow-up evaluation for multiple ulcerations to his bilateral feet. He presents with a new ulceration to the plantar aspect of the left foot underneath the blister, this was deroofed to reveal a partial thickness ulcer. He is voicing no complaints or concerns, tolerated dialysis yesterday. We will continue with same treatment plan and he will follow-up next week 03/21/18; the patient has a area on the lateral left foot which still has a small probing area. The overall surface area of the wound is better. He presented with a new ulceration on the left foot plantar fifth metatarsal head last week. The area on the right third metatarsal head appears smaller.using silver alginate to all wound areas. The patient is changing the dressing himself. He is using a [place] forefoot offloading are on the right and a healing sandal on the left 03/28/18; original wound left lateral foot. Still nowhere close to looking like a heeling surface oosecondary wound on the left lateral foot at the base of his question fifth metatarsal which was new last week ooRight third metatarsal head. ooWe have been using silver alginate all wounds 04/04/18; the original wound on the left lateral foot again heavy callus surrounding thick nonviable subcutaneous tissue all requiring debridement. The new wound from 2 weeks ago just near this at the base of the fifth metatarsal head still looks about the same ooUnfortunately there is been  deterioration in the third medical head wound on the right which now probes to bone. I must say this was a superficial wound at one point in time and I really don't have a good frame of reference year. I'll have to go back to look through records about what we know about the right foot. 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 He is also been to see Dr. [name]. Somewhere in here as somebody is ordered a VASCULAR evaluation which was done on 03/22/18. On the right is anterior tibial artery was monophasic  triphasic at the posterior tibial artery. On the left anterior tibial artery monophasic posterior tibial artery monophasic. ABI in the right was 1.43 on the left 1.21. TBIs on the right at 0.41 and on the left at 0.32. A vascular consult was recommended and I think has been arranged. 04/11/18; Mr. Hanf has not had an MRI of the right foot since 2017. Recent x-ray of the right foot done in January and February was negative. However he has had a major deterioration in the wound over the third met head. He is completed antibiotics last week at dialysis [place]. I think he is going to see vascular in follow-up. The areas on the left lateral foot and left plantar fifth metatarsal head both look satisfactory. I debrided both of these areas although the tissue here looks good. The area on the left lateral foot once probe to bone it certainly does not do that now 04/18/18; MRI of the right foot is on Thursday. He has what looks to be serosanguineous purulent drainage coming out of the wound over the right third metatarsal head today. He completed antibiotics bank and Fortaz 2 weeks ago at dialysis. He has a vascular follow-up with regards to his arterial insufficiency although I don't exactly see when that is booked. The areas on the left foot including the lateral left foot and the plantar left fifth  metatarsal head look about the same. 04/25/18-He is here in follow-up evaluation for bilateral foot ulcers. MRI obtained was negative for osteomyelitis. Wound culture was negative. We will continue with same treatment plan he'll follow-up next week 05/02/18; the right plantar foot wound over the third metatarsal head actually looks better than when I last saw this. His MRI was negative for osteomyelitis wound culture was negative. ooOn the left plantar foot both wounds on the plantar fifth metatarsal head and on the lateral foot both are covered in a very hard circumferential callus. 05/09/18; right plantar foot wound over the third metatarsal head stable from last week. ooLeft plantar foot wound over the fifth metatarsal head also stable but with callus around both wound areas ooThe area on the left lateral foot had thick callus over a surface and I had some thoughts about leaving this intact however it felt boggy. ooWe've been using silver alginate all wounds 05/16/18; since the patient was last here he was hospitalized from 5/15 through 5/19. 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 This grew MRSA. He was placed on vancomycin.MRI of the left foot suggested "progressive" osteomyelitis and bone destruction of the cuboid and the base of the fifth metatarsal. Stable erosive changes of the base of the second metatarsal. I'm wondering if they're aware that he had surgery and debridement in the area of the underlying bone previously by Dr. [name]. In any case, He was also revascularized with an angioplasty and stenting of the left tibial peroneal trunk and angioplasty of the left posterior tibial artery. He is on vancomycin at dialysis. He has a 2 week follow-up with infectious disease. He has vascular surgery follow-up. He was started on Plavix. 05/23/18; the patient's wound on the lateral left foot  at the level of the fifth metatarsal head and the plantar wound on the plantar fifth metatarsal head both look better. The area on the right third metatarsal head still has depth and undermining. We've been using silver alginate. He is on  vancomycin. He has been revascularized on the left 05/30/18; he continues on vancomycin at dialysis. Revascularized on the left. The area on the left lateral foot is just about closed. Unfortunately the area over the plantar fifth metatarsal head undermining superiorly as does the area over the right third metatarsal head. Both of these significantly deteriorated from last week. He has appointments next week with infectious disease and orthopedics I delayed putting on a cast on the left until those appointments which therefore we made we'll bring him in on Friday the 14th with the idea of a cast on the left foot 06/06/18; he continues on vancomycin at dialysis. Revascularized on the left. He arrives today with a ballotable swelling just above the area on the left lateral foot where his previous wound was. He has no pain but he is reasonably insensate. The difficult areas on the plantar left fifth metatarsal head looks stable whereas the area on the right third plantar metatarsal head about the same as last week there is undermining here although I did not unroofed this today. He required a considerable debridement of the swelling on the left lateral foot area and I unroofed an abscess with a copious amount of brown purulent material which I obtained for culture. I don't believe during his recent hospitalization he had any further imaging although I need to review this. Previous cultures from this area done in this clinic showed MRSA, I would be surprised if this is not what this is currently even though he is on vancomycin. 06/13/18; he continues on vancomycin at dialysis however he finishes this on Sunday and then graduates the doxycycline previously prescribed by  Dr. [name]. The abscess site on the lateral foot that I unroofed last time grew moderate amounts of methicillin-resistant staph aureus that is both vancomycin and tetracycline sensitive so we should be okay from that regard. He arrives today in clinic with a connection between the abscess site and the area on the lateral foot. We've been using silver alginate to all his wound areas. The right third metatarsal head wound is measuring smaller. Using silver alginate on both wound areas 06/20/18; transitioning to doxycycline prescribed by Dr. [name]. The abscess site on the lateral foot that I unroofed 2 weeks ago grew MRSA. That area has largely closed down although the lateral part of his wound on the fifth metatarsal head still probes to bone. I suspect all of this was connected. ooOn the right third metatarsal head smaller looking wound but surrounded by nonviable tissue that once again requires debridement using silver alginate on both areas 06/27/18; on doxycycline 100 twice a day. The abscess on the lateral foot has closed down. Although he still has the wound on the left fifth metatarsal head that extends towards the lateral part of the foot. The most lateral part of this wound probes to bone ooRight third metatarsal head still a deep probing wound with undermining. Nevertheless I elected not to debridement this this week ooIf everything is copious static next week on the left I'm going to attempt a total contact cast 07/04/18; he is tolerating his doxycycline. The abscess on the left lateral foot is closed down although there is still a deep wound here that probes to bone. We will use silver collagen under the total contact cast Silver collagen to the deep wound over the right third metatarsal head is well 07/11/18; he is tolerating doxycycline. The left lateral fifth plantar metatarsal head still probes deeply but I could not probe any  bone. We've been using silver collagen and this will be the  second week under the total contact cast ooSilver collagen to deep wound over the right third metatarsal head as well 07/18/18; he is still taking doxycycline. The left lateral fifth plantar metatarsal this week probes deeply with a large amount of exposed bone. Quite a deterioration. He required extensive debridement. Specimens of bone for pathology and CNS obtained ooAlso considerable debridement on the right third metatarsal head 07/25/18; bone for pathology last week from the lateral left foot showed osteomyelitis. CandS of the bone Enterobacter. And Klebsiella. He is now on ciprofloxacin and the doxycycline is stopped [Dr. [name] of infectious disease] area He has an appointment with Dr. [name] tomorrow I'm going to leave the total contact cast off for this week. May wish to try to reapply that next week or the week after depending on the wound bed looks. I'm not sure if there is an operative option here, previously is followed with Dr. [name] 08/01/18 on evaluation today patient presents for reevaluation. He has been seen by infectious disease, Dr. [name], and he has placed the patient back on Ceftaz currently to be given to him at dialysis three times a week. 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 Fortunately there does not appear to be the evidence of infection at this point in time. Overall the patient has been tolerating the dressing changes without complication. We are no longer due lives in the cast of left foot secondary to the infection obviously. 08/10/18 on evaluation today patient appears to be doing rather well all things considered in regard to his left plantar foot ulcer. He does have a significant callous on the lateral portion of his left foot he wonders if I can help clean that away to some degree today. Fortunately he is having no evidence of infection at this time which is good news. No fevers chills  noted. With that being said his right plantar foot actually does have a significant callous buildup around the wound opening this seems to not be doing as well as I would like at this point. I do believe that he would benefit from sharp debridement at this site. Subsequently I think a total contact cast would be helpful for the right foot as well. 08/17/18; the patient arrived today with a total contact cast on the right foot. This actually looks quite good. He had gone to see Dr. Megan Salon of infectious disease about the osteomyelitis on the left foot. I think he is on IV Fortaz at dialysis although I am not exactly sure of the rationale for the Fortaz at the time of this dictation. He also arrived in today with a swelling on the lateral left foot this is the site of his original wounds in fact when I first saw this man when he was under Dr. Ardeen Garland care I think this was the site of where the wound was located. It was not particularly tender however using a small scalpel I opened it to [place] some moderate amount of purulent drainage. 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 Last MRI was in May that showed progressive osteomyelitis and bone destruction of the cuboid and base of the fifth metatarsal. Last week he had an abscess over this same area this was removed. Culture  was negative. ooWe are continuing with a total contact cast to the area on the third metatarsal head on the right and making some good progress here. The patient asked me again about renal transplant. He is not on the list because of the open wounds. 08/31/18; apparently the patient had an interruption in the [place] but he is now back on this at dialysis. Apparently there was a misunderstanding and Dr. Crissie Figures orders. Due to have the MRI of the left foot tonight. ooThe area on the right foot continues to  have callus thick skin and subcutaneous tissue around the wound edge that requires constant debridement however the wound is smaller we are using a total contact cast in this area. Alginate all wounds 09/07/18; without much surprise the MRI of his left foot showed osteomyelitis in the reminiscent of his fourth metatarsal but also the third metatarsal. She arrives in clinic today with the right foot and a total contact cast. There was purulent drainage coming out of the wound which I have cultured. Marked deterioration here with undermining widely around the wound orifice. Using pickups and a scalpel I remove callus and subcutaneous tissue from a substantial new opening. ooIn a similar fashion the area on the left lateral foot that was blistered last week I have opened this and remove skin and subcutaneous tissue from this area to expose a obvious new wound. I think there is extension and communication between all of this on the left foot. ooThe patient is on Fortaz at dialysis. Culture done of the right foot. We are clearly not making progress here. ooWe have made him an appointment with Dr. [name] of orthopedics. I think an amputation of the left leg may be discussed. I don't think there is anything that can be done with foot salvage. 09/14/18; culture from the right foot last time showed a very resistant MRSA. This is resistant to doxycycline. I'm going to try to get linezolid at least for a week. He does not have a appointment with Dr. [name] yet. 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 I'll send a message to Dr. [name] of infectious disease. I want the patient to see Dr. [name] to go over the pros and cons of an amputation which I think will be a BKA. The patient had a question about whether this is curative or not. I told him that I thought it would be although spread of staph aureus infection is not unheard of. MRI of the right foot  was done at the end of April 2019 did not show osteomyelitis. The patient last saw Dr. [name] on 9/3. I'll send Dr. [name] a message. I would really like him to weigh the pros and cons of a BKA on the left otherwise he'll probably need IV antibiotics and perhaps hyperbaric oxygen again. He has not had a good response to this in the past. His MRI earlier this month showed progressive damage in the remnants of the fourth and third metatarsal 09/21/2018; sees Dr. [name] of orthopedics next week to discuss a left BKA in response to the osteomyelitis in the left foot. Using silver alginate to all wound areas. He is completing the Zyvox I did put in for him after last culture showed MRSA 09/29/2018; patient saw Dr. [name] of orthopedics discussed the osteomyelitis in the left foot third and fourth metatarsals. He did not recommend urgent surgery but certainly stated the only surgical option would be  a BKA. He is communicating with Dr. [name]. The problem here is the instability of the areas on his foot which constantly generate draining abscesses. I will communicate with Dr. [name] about this. He has an appointment on 10/23 10/05/2018; patient sees Dr. [name] on 10/23. I have sent him a secure message not ordered any additional antibiotics for now. Patient continues to have 2 open areas on the left plantar foot at the fifth metatarsal head and the lateral aspect of the foot. These have not changed all that much. The area on the right third met head still has thick callus and surrounding subcutaneous tissue we have been using silver alginate 10/12/2018; patient sees Dr. [name] or colleague on 10/23. 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 The area on the right plantar foot is just about closed. The superior wound on the left fifth metatarsal head is callused over but I am not sure if this is closed. The area below it is about the same.  Considerable amount of callus on the lateral foot. We have been using silver alginate to the wounds 10/19/2018; the patient saw Dr. [name] on 10/22. He wishes to try and continue to save the left foot. He has been given 6 weeks of oral ciprofloxacin. We continue to put a total contact cast on the right foot. The area over the fifth metatarsal head on the left is callused over/perhaps healed but I did not remove the callus to find out. He still has the wound on the left lateral midfoot requiring debridement. We have been using silver alginate to all wound areas 10/26/2018 ooOn the left foot our intake nurse noted some purulent drainage from the inferior wound [currently the only one that is not callused over] ooOn the right foot even though he is in total contact cast a considerable amount of thick black callus and surface eschar. On this side we have been using silver alginate under a total contact cast. oohe remains on ciprofloxacin as prescribed by Dr. [name] 11/02/2018; culture from last week which is done of the probing area on the left midfoot wound showed MRSA "few". I am going to need to contact Dr. [name] which I will do today I think he is going to need IV antibiotics again. The area on the left foot which was callused on the side is clearly separating today all of this was removed a copious amounts of callus and necrotic subcutaneous tissue. ooThe area on the right plantar foot actually remained healthy looking with a healthy granulated base. ooWe are using silver alginate to all wound areas 11/09/2018; unfortunately neither 1 of the patient's wounds areas looks at all satisfactory. On the left he has considerable necrotic debris from the plantar wound laterally over the foot. I removed copious amounts of material including callus skin and subcutaneous tissue. On the right foot he arrives with undermining and frankly purulent drainage. Specimen obtained for culture and debridement  of the callused skin and subcutaneous tissue from around the circumference. Because of this I cannot put him back in a total contact cast but to be truthful we are really unfortunately not making a lot of progress I did put in a secure text message to Dr. [name] wondering about the MRSA on the left foot. He suggested vancomycin although this is not started. I was left wondering if he expected me to send this into dialysis. Has we have more purulence on the right side  wait for that result before calling dialysis 11/16/2018; I called dialysis earlier this week to get the vancomycin started. I think he got the first dose on Tuesday. The patient tells me that he fell earlier this week twisting his left foot and ankle and he is swelling. He continues to not look well. He had blood cultures done at dialysis on Tuesday he is not been informed of the results. 12/07/2018; the patient was admitted to hospital from 11/22/2018 through 12/02/2018. He underwent a left BKA. Apparently had staph sepsis. In the meantime being off the right foot this is closed over. He follows up with Dr. 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 Subsequent to this he actually had multiple falls in fact he tells me to following which calls the wound to be his apparently. He has been seeing Dr. [name] and his physician assistant in the interim. They actually did want him to come back for reevaluation to see if there's anything we can do for me wound care perspective the help this area to heal more appropriately. The patient states that he does not have a tremendous amount of pain which is good news. No fevers, chills, nausea, or vomiting noted at this time. We have gotten approval from Dr. [name] office had [place] for [place] to treat the patient for his stop wound. This is due to the fact that the patient was in  the 90 day postop global. 1/21; the patient does not have an open wound on the right foot although he does have some pressure areas that will need to be padded when he is transferring. The dehisced surgical wound looks clean although there is some undermining of 1.5 cm superiorly. 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 This appears healthy but there is undermining. TheraSkin #1 2/11; TheraSkin #2 2/25; TheraSkin #3. Wound is measuring smaller 3/10; TheraSkin #4 wound is measuring smaller 3/24; TheraSkin #5 wound is measuring smaller and looks healthy. 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 He has no open area on the right foot although he does have some callused area over the original third metatarsal head wound. The third metatarsal is also subluxed on the right foot. I have warned him today that he cannot consider wearing a prosthesis for at least a month but he can go for measurements. He is going to need to keep the right foot padded is much as possible in his diabetic shoe indefinitely READMISSION 06/12/2019 Mr. Borenstein is a type II diabetic on dialysis. He has been in this clinic multiple times with wounds on his bilateral lower extremities. 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 He also has a history of wounds on his right foot but tells me that everything is going well here. He actually came in here with his prosthesis walking with a cane. We were all really quite gratified to see this He states over the last several weeks he has had a painful area at the tip of the left third finger. His dialysis shunt in his is in the left upper arm and this particularly hurts during dialysis when his fingers get numb and there is a lot of  pain in the tip of his left third finger. I believe he  is seen vascular surgery. He had a Doppler done to evaluate for a dialysis steal syndrome. He is going for a banding procedure 1 week from today towards the left AV fistula. I think if that is unsuccessful they will be looking at creating a new shunt. He had a course of doxycycline when he which he finished about a week ago. He has been using Bactroban to the finger 6/25; the patient had his banding procedure and things seem to be going better. He is not having pain in his hand at dialysis. The area on the tip of his finger seems about closed. He did however have a paronychia I the last time I saw him. I have been advising him to use topical Bactroban washing the finger. Culture I did last time showed a few staph epidermidis which I was willing to dismiss as superficial skin contaminant. He arrives today with the finger wound looking better however the paronychia a seems worse 7/9; 2-week follow-up. He does not have an open wound on the tip of his third left finger. I thought he had an initial ischemic wound on the tip of the finger as well as a paronychia a medially. All of this appears to be closed. 8/6-Patient returns after 3 weeks for follow-up and has a right second met head plantar callus with a significant area of maceration hiding an ulcer ABI repeated today on right - 1.26 8/13-Patient returns at 1 week, the right second met head plantar wound appears to be slightly better, it is surrounded by the callus area we are using silver alginate 8/20; the patient came back to clinic 2 weeks ago with a wound on the right second metatarsal head. He apparently told me he developed callus in this area but also went away from his custom shoes to a pair of running shoes. Using silver alginate. 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 There might be much we can do to offload this although the patient tells me he has a wheelchair that he is using to try and stay off the wound is much  as possible 8/27; right second metatarsal head. Still small punched out area with thick callus and subcutaneous tissue around the wound. We have been using silver collagen. This is always been an issue with this man's wound especially on this foot 9/3; right second met head. Still the same punched-out area with thick callus and subcutaneous tissue around the wound removing the circumference demonstrates repetitively undermining area. I have removed all the subcutaneous tissue associated with this. We have been using silver collagen 9/15; right second metatarsal head. Same punched-out thick callus and subcutaneous tissue around the wound area. Still requiring debridement we have been using silver collagen I changed back to silver alginate X-ray last time showed no evidence of osteomyelitis. Culture showed Klebsiella and he was started and Keflex apparently just 4 days ago 9/24; right second metatarsal head. He is completed the antibiotics I gave him for 10 days. Same punched-out thick callus and subcutaneous tissue around the area. Still requiring debridement. We have been using silver alginate. The area itself looks swollen and this could be just [place] that comes on this area with walking on a prosthesis on the left however I think he needs an MRI and I am going to order that today. There is not an option to offload this further 10/1; right second plantar  metatarsal head. MRI booked for October 6. Generally looking better today using silver alginate 10/8; right second plantar metatarsal head. MRI that was done on 10/6 was negative for osteomyelitis noted prior amputations of the second and fourth toes at the [facility]. Prior amputation of the third toe to the base of the middle phalanx. Prior amputation of the fifth toe to the head of the metatarsal. 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 He does not recall about hearing about this. He did not have any  pain but then again he has reduced sensation from diabetic neuropathy 10/15; right second plantar metatarsal head. Still in the same condition callus nonviable subcutaneous tissue which cleans up nicely with debridement but reforms by the next week. The patient tells me that he is offloading this is much as he can including taking his wheelchair to dialysis. We have been using silver alginate, changed to silver collagen today 10/22; right second plantar metatarsal head. Wound measures smaller but still thick callus around this wound. Still requiring debridement 11/5 right second plantar metatarsal head. Less callus around the wound circumference but still requiring debridement. There is still some undermining which I cleaned out the wound looks healthy but still considerable punched out depth with a relatively small circumference to the wound there is no palpable bone no purulent drainage 11/12; right second plantar metatarsal head. Small wound with thick callus tissue around the wound and significant undermining. We have been using silver collagen after my continuous debridements of this area 11/19; patient arrives in today with purulent drainage coming out of the wound in the second third met head area on the right foot. Serosanguineous thick drainage. Specimen obtained for culture. 11/21/2019 on evaluation today patient appears to be doing well with regard to his foot ulcer. The good news is is culture came back negative for any bacteria there was no growth. The other good news is his x-ray also appears to be doing well. The foot is also measuring much better than what it was. Overall I am very pleased with how things seem to be progressing. 12/22; patient was in [facility] for several weeks due to the death of a family member. He said he stayed off the wound using silver alginate. 01/03/2020. Patient has a small open area with roughly 0.3 cm of circumferential undermining. The orifice looks  smaller. We have been using silver collagen. There is not a wound more aggressive way to offload this area as he has a prosthesis on the other leg 1/21; again the same small area is 2 weeks ago. We have been using silver collagen I change that to endoform today I really believe that this is an offloading issue 2/4; the area on the right third met head. We have been using endoform. 2/11 the area on the right third met head we have been using silver alginate. 2/25; the area on the right third met head. There is much less callus on this today. The patient states he is offloading this even more aggressively. As an example he is taking his wheelchair into dialysis on most days 3/4; right third metatarsal head. Again he has eschar over the surface of the wound which I removed with a #3 curette. Some subcutaneous debris. This has 0.8 cm of direct probing depth. I cannot feel any bone here. I did do a culture the area 3/11; right third metatarsal head. Again an eschared area over the wound which almost makes this  look superficial. Again debridement reveals a wound with probing depth probably not much different from last week there is no palpable bone no palpable purulence. The patient tells me that he is making every effort to offload this area properly. He is taking wheelchair into dialysis etc. There is no option for forefoot offloading or a total contact cast. We used Oasis #1 today 3/18; again callus and eschar over the wound surface. I remove this but still a probing wound although only 3 mm in depth this time. This is an improvement 3/25; again callus and eschar over the wound surface which I removed the wound is much deeper today at 0.7 cm. There is no palpable bone. Because of the appearance of this a culture was done. I am not going to put the Oasis back in this. Concerned about underlying infection. Notable for the fact that he is just finished doxycycline for the last go round of this 3/30;  still 0.7 cm in depth which is disappointing. Culture I gave last time showed a few Staph aureus which is methicillin susceptible. This should have been taken care of by the recent course of doxycycline I gave him. We are using silver alginate 4/6; almost 1 cm in depth. We have been using silver alginate with underlying Bactroban. 4/15; about 1 cm in depth still. There is no palpable bone but there is undermining thick surface again as usual. Our intake nurse noted what looked to be purulent drainage I suppose that could have been Bactroban but I did a culture for CandS 4/20; depth down 0.7 cm. Culture from last time was negative we have been using Bactroban and silver alginate 4/29; depth down 0.5 cm. Using silver alginate. He reports some drainage 5/6; comes in with the wound worse. Wound is deeper and tunnels. Measures 0.9 depth and an undermining area of 1.8 cm. 5/18; there is a change the dressing last week. Still using Sorbact hydrogel. What I can see of the base of the wound looks better. 6/1. Not much change. I changed him to endoform today. He has 1.1 cm in depth no palpable bone 6/8; again a small punched out wound with significant undermining. Culture I did last week was negative. Today he had some purulent drainage perhaps mixed with some retained endoform. 6/22; MRI report below ooIMPRESSION: Skin ulceration on the plantar surface of the foot at the level of the head of the second metatarsal. Mild marrow edema in the head of the second metatarsal could be reactive or due to osteomyelitis. Thin fluid collection surrounding the head and neck of the second metatarsal appears to communicate with the patient's skin wound and is worrisome for a septic collection. Advanced first MTP osteoarthritis. Prior amputations as noted above. Electronically Signed By: Inge Rise M.D. On: 06/16/2020 11:51 PCR culture I did last time I believe showed peptostreptococcus. There was no comment  on any other gram-negative or gram-positive organisms. I gave him a course of Flagyl which I think he is finishing up. We are not making any progress with the wound over the right second/third metatarsal head. We are using silver alginate putting him in kerlix Coban 6/29; has his appointment with Dr. [name] of orthopedics next Tuesday I think we see him on the same day. He was in for a nurse visit today. Nurse noted undermining of skin and callus so I was asked to look at this. We have been using silver alginate and kerlix Coban 7/6; patient saw Dr. [name] PA today will see  Dr. [name] on Tuesday. The major question is does he needs surgery to clean out the area in question culture question resect any involved bone etc. Otherwise he will need to see infectious disease for broad-spectrum antibiotics given at dialysis 7/13; I talked to Dr. [name] late last week. He was not impressed with the MRI results thinking that the bone changes may have been reactive rather than osteomyelitis. He did not think he needed drainage of an abscess thinking of the fluid we are seeing was superficial and contiguous with the actual wound. He did not think he needed surgery or resection. Recommended consideration of infectious disease and return him to our care. We have been using silver alginate 7/20; PCR culture I did last week showed staph aureus methicillin sensitive. I have him on twice daily Keflex. Silver alginate to the wound 7/27; I have continued his 500 every 12 of cephalexin with a second dose after dialysis. My intention is to give him 4 to 6 weeks of antibiotics directed at [facility] 07/31/2020 on evaluation today patient appears to be doing a little better in regard to his overall wound size. This is good news. He does have a fairly significant callus buildup still and I am can work on that today. Fortunately there is no signs of active infection at this time. He has been on Keflex and has an extension of that that  he is going to pick up and continue taking. 08/07/20-Patient back at 1 week with right met head wound looking about the same, overall the callus removal has helped 8/19; right third met head. Again extensive callus removal with a 10 scalpel. There is a small remaining wound we have been using silver alginate Patient also comes in having a fall and an abrasion on his left elbow just distally. This is a clean wound and should heal with simple measures Objective Constitutional Sitting or standing Blood Pressure is within target range for patient.. Pulse regular and within target range for patient.Marland Kitchen Respirations regular, non-labored and within target range.. Temperature is normal and within the target range for the patient.. Vitals Time Taken: 3:30 PM, Height: 73 in, Weight: 202 lbs, BMI: 26.6, Temperature: 98.1 F, Pulse: 80 bpm, Respiratory Rate: 18 breaths/min, Blood Pressure: 116/70 mmHg. General Notes: Wound exam; right plantar third metatarsal head. Thick callus over the wound area which I removed with a #10 blade. Superficial open area beneath this but with good surrounding skin. ooHe also has an abrasion injury on the left dorsal forearm just below the elbow. This is clean and superficial. Integumentary (Hair, Skin) Wound #41 status is Open. Original cause of wound was Gradually Appeared. The wound is located on the Right Metatarsal head second. The wound measures 0.2cm length x 0.2cm width x 0.3cm depth; 0.031cm^2 area and 0.009cm^3 volume. There is Fat Layer (Subcutaneous Tissue) Exposed exposed. There is no tunneling or undermining noted. There is a small amount of serosanguineous drainage noted. The wound margin is thickened. There is large (67-100%) red, pink granulation within the wound bed. There is a small (1-33%) amount of necrotic tissue within the wound bed including Adherent Slough. Wound #42 status is Open. Original cause of wound was Trauma. The wound is located on the Left  Forearm. The wound measures 2.5cm length x 1.1cm width x 0.1cm depth; 2.16cm^2 area and 0.216cm^3 volume. There is Fat Layer (Subcutaneous Tissue) Exposed exposed. There is no tunneling or undermining noted. There is a medium amount of serosanguineous drainage noted. There is large (67-100%) red  granulation within the wound bed. 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 There was a Selective/Open Wound Non-Viable Tissue Debridement with a total area of 1 sq cm performed by [name]., MD. With the following instrument(s): Blade to remove Non-Viable tissue/material. Material removed includes Callus. No specimens were taken. A time out was conducted at 16:10, prior to the start of the procedure. A Minimum amount of bleeding was controlled with Pressure. The procedure was tolerated well with a pain level of 0 throughout and a pain level of 0 following the procedure. Post Debridement Measurements: 0.2cm length x 0.2cm width x 0.3cm depth; 0.009cm^3 volume. Character of Wound/Ulcer Post Debridement is improved. Severity of Tissue Post Debridement is: Fat layer exposed. Post procedure Diagnosis Wound #41: Same as Pre-Procedure Plan Follow-up Appointments: Return Appointment in 1 week. Dressing Change Frequency: Wound #41 Right Metatarsal head second: Do not change entire dressing for one week. Wound #42 Left Forearm: Change dressing every day. Skin Barriers/Peri-Wound Care: Moisturizing lotion Wound Cleansing: May shower  with protection. - patient to use a cast protector Primary Wound Dressing: Wound #41 Right Metatarsal head second: Calcium Alginate with Silver Wound #42 Left Forearm: Other: - Triple Antibiotic Ointment Secondary Dressing: Wound #41 Right Metatarsal head second: Dry Gauze Other: - felt callous pad Wound #42 Left Forearm: Foam Border - or bandaid Edema Control: Kerlix and Coban - Right Lower Extremity - cotton cast padding instead of kerlix Avoid standing for long periods of time Elevate legs to the level of the heart or above for 30 minutes daily and/or when sitting, a frequency of: - throughout the day Additional Orders / Instructions: Other: - minimize walking and standing on foot to aid in wound healing and callus buildup. 1. Continued with the silver alginate to the right foot. There is a very tiny superficial wound remaining. I am hopeful that this is closing over. 2. I emphasized offloading Per 3 the area on his left arm is superficial. I think topical antibiotics and a Band-Aid should suffice here. Change daily. We will check this next week Electronic Signature(s) Signed: 08/14/2020 5:57:44 PM By: [name] Ham MD Entered By: [name] on 08/14/2020 17:28:43 -------------------------------------------------------------------------------- SuperBill Details Patient Name: Date of Service: Kateri Plummer 08/14/2020 Medical Record Number: [id number] Patient Account Number: [id number] Date of Birth/Sex: Treating RN: 1951-08-21 (69 y.o. [name] Primary Care Provider: Kristie [name] Other Clinician: Referring Provider: Treating Provider/Extender: [name] in Treatment: 61 Diagnosis Coding ICD-10 Codes Code Description E11.621 Type 2 diabetes mellitus with foot ulcer L97.512 Non-pressure chronic ulcer of other part of right foot with fat layer exposed I70.298 Other atherosclerosis of native arteries of extremities, other  extremity L03.115 Cellulitis of right lower limb S50.312D Abrasion of left elbow, subsequent encounter Facility Procedures The patient participates with Medicare or their insurance follows the Medicare Facility Guidelines: CPT4 Code Description Modifier Quantity [id number] - DEBRIDE WOUND 1ST 20 SQ CM OR < 1 ICD-10 Diagnosis Description L97.512 Non-pressure chronic ulcer  of other part of right foot with fat layer exposed Physician Procedures : CPT4 Code Description  Modifier [id number] - WC PHYS DEBR WO ANESTH 20 SQ CM ICD-10 Diagnosis Description L97.512 Non-pressure chronic ulcer of other part of right foot with fat layer exposed Quantity: 1 Electronic Signature(s) Signed: 08/14/2020 5:57:44 PM By: [name] Ham MD Entered By: [name] on 08/14/2020 17:29:06

## 2020-08-21 ENCOUNTER — Other Ambulatory Visit: Payer: Self-pay

## 2020-08-21 ENCOUNTER — Encounter (HOSPITAL_BASED_OUTPATIENT_CLINIC_OR_DEPARTMENT_OTHER): Payer: Medicare Other | Admitting: Internal Medicine

## 2020-08-21 DIAGNOSIS — E11621 Type 2 diabetes mellitus with foot ulcer: Secondary | ICD-10-CM | POA: Diagnosis not present

## 2020-08-21 NOTE — Progress Notes (Signed)
[name], [name] ([id number]) Visit Report for 08/21/2020 HPI Details Patient Name: Date of Service: [name] 08/21/2020 2:45 PM Medical Record Number: [id number] Patient Account Number: [id number] Date of Birth/Sex: Treating RN: May 23, 1951 (68 y.o. [name] Primary Care Provider: [name] Other Clinician: Referring Provider: Treating Provider/Extender: Tanna Savoy in Treatment: 69 History of Present Illness Location: Patient presents with a wound to bilateral feet. Quality: Patient reports experiencing essentially no pain. Severity: Mildly severe wound with no evidence of infection Duration: Patient has had the wound for greater than 2 weeks prior to presenting for treatment HPI Description: Stomatitis cyst the patient is a pleasant 69 yrs old bm here for evaluation of ulcers on the plantar aspect of both feet. 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 He has a left arm graft for access. He has been trying to stay off his feet for weeks but does not seem to have any improvement in the wound. He has been seeing someone at the foot center and was referred to the Wound Care center for further evaluation. 12/30/15 the patient has 3 wounds one over the right first metatarsal head and 2 on the left foot at the left fifth and lleft first metatarsal head. All of these look relatively similar. The one over the left fifth probe to bone I could not prove that any of the others did. I note his MRI in November that did not show osteomyelitis. His peripheral pulses seem robust. All of these underwent surgical debridement to remove callus nonviable skin and subcutaneous tissue 01/06/16; the patient had his sutures removed from the right fifth ray amputation. There may be a small open part of this superiorly but otherwise the incision looks good. Areas over his right first, left first and left fifth metatarsal  head all underwent surgical debridement as a varying degree of callus, skin and nonviable subcutaneous tissue. The area that is most worrisome is the right fifth metatarsal head which has a wound probes precariously close to bone. There is no purulent drainage or erythema 01/13/16; I'm not exactly sure of the status of the right fifth ray amputation site however he follows with Dr. [name] later this week. The area over his right first plantar metatarsal head, left first and fifth plantar metatarsal head are all in the same status. Thick circumferential callus, nonviable subcutaneous tissue. Culture of the left fifth did not culture last week 01/20/16; all of the patient's wounds appear and roughly the same state although his amputation site on the right lateral foot looks better. His wounds over the right first, left first and left fifth metatarsal heads all underwent difficult surgical debridement removing circumferential callus nonviable skin and subcutaneous tissue. There is no overt evidence of infection in these areas. MRI at the end of December of the left foot did not show osteomyelitis, right foot showed osteomyelitis of the right fifth digit he is now status post amputation. 01/27/16 the patient's wounds over his plantar first and fifth metatarsal heads on the left all appear better having been started on a total contact cast last week. They were debridement of circumferential callus and nonviable subcutaneous tissue as was the wound over the first metatarsal head. His surgical incision on the right also had some light surface debridement done. 03/23/2016 -- the patient was doing really well and most of his wounds had almost completely healed but now he came back today with a history  of having a discharge from the area of his right foot on the plantar aspect and also between his first and second toe. He also has had some discharge from the left foot. Addendum: I spoke to the PA [name] at the dialysis center whose fax number is [phone]. We discussed the infection the patient has and she will put the patient on vancomycin and Fortaz until the final culture report is back. We will fax this as soon as available. 03/30/2016 -- left foot x-ray IMPRESSION:No definitive osteomyelitis noted. X-ray of the right foot -- IMPRESSION: 1. Soft tissue swelling. Prior amputation right fifth digit. No acute or focal bony abnormality identified. If osteomyelitis remains a clinical concern, MRI can be obtained. 2. Peripheral vascular disease. His culture reports have grown an MSSA -- and we will fax this report to his hemodialysis center. He was called in a prescription of oral doxycycline but I have told her not to fill this in as he is already on IV antibiotics. 04/06/2016 -- a few days ago, I spoke to the hemodialysis center nurse who had stopped the IV antibiotics and he was given a prescription for doxycycline 100 mg by mouth twice a day for a week and he is on this at the present time. 05/11/2016 -- he has recently seen his PCP this week and his hemoglobin A1c was 7. He is working on his paperwork to get his orthotic shoes. 05/25/2016 -- -- x-ray of the left foot IMPRESSION:No acute bony abnormality. No radiographic changes of acute osteomyelitis. No change since prior study. X-ray of the right foot -- IMPRESSION: Postsurgical changes are seen involving the fifth toe. No evidence of acute osteomyelitis. he developed a large blister on the medial part of his right foot and this opened out and drain fluid. 06/01/2016 -- he has his MRI to be done this afternoon. 06/15/2016 -- MRI of the left forefoot without contrast shows 2 separate regions of cutaneous and subcutaneous edema and possible ulceration and blistering along the ball of the foot. No obvious osteomyelitis identified. MRI of the right foot showed cutaneous and subcutaneous thickening plantar to the first digit sesamoid  with an ulcer crater but no underlying osteomyelitis is identified. 06/22/2016 -- the right foot plantar ulcer has been draining a lot of seropurulent material for the last few days. 06/30/2016 -- spoke to the dialysis center and I believe I spoke to [place] a PA at the center who discussed with me and agreed to putting Legrand Como on vancomycin until his cultures arrive. On review of his culture report no WBCs were seen or no organisms were seen and the culture was reincubated for better growth. The final report is back and there were no predominant growth including Streptococcus or Staphylococcus. Clinically though he has a lot of drainage from both wounds a lot of undermining and there is further blebs on the left foot towards the interspace between his first and second toe. 08/10/2016 -- the culture from the right foot showed normal skin flora and there was no Staphylococcus aureus was group A streptococcus isolated. 09/21/2016 -- -- MRI of the right foot was done on 09/13/2016 - IMPRESSION: Findings most consistent with acute osteomyelitis throughout the great toe, sesamoid bones and plantar aspect of the head of the first metatarsal. Fluid in the sheath of the flexor tendon of the great toe could be sympathetic but is worrisome for septic tenosynovitis. First MTP joint effusion worrisome for septic joint. He was admitted to the hospital  on 09/11/2016 and treated for a fever with vancomycin and cefepime. He was seen by Dr. Bobby Rumpf of infectious disease who recommended 6 weeks treatment with vancomycin and ceftazidime with his hemodialysis and to continue to see as in the wound clinic. Vascular consult was pending. The patient was discharged home on 09/14/2016 and he would continue with IV antibiotics for 6 weeks. 09/28/16 wound appears reasonably healthy. Continuing with total contact cast 10/05/16 wound is smaller and looking healthy. Continue with total contact cast. He continues on IV  antibiotics 10/12/2016 -- he has developed a new wound on the dorsal aspect of his left big toe and this is a superficial injury with no surrounding cellulitis. He has completed 30 days of IV antibiotics and is now ready to start his hyperbaric oxygen therapy as per his insurance company's recommendation. 10/19/2016 -- after the cast was removed on the right side he has got good resolution of his ulceration on the right plantar foot. he has a new wound on the left plantar foot in the region of his fourth metatarsal and this will need sharp debridement. 10/26/2016 -- Xray of the right foot complete: IMPRESSION: Changes consistent with osteomyelitis involving the head of the first metatarsal and base of the first proximal phalanx. The sesamoid bones are also likely involved given their positioning. 11/02/2016 -- there still awaiting insurance clearance for his hyperbaric oxygen therapy and hopefully he will begin treatment soon. 11/09/2016 -- started with hyperbaric oxygen therapy and had some barotrauma to the right ear and this was seen by ENT who was prescribed Afrin drops and would probably continue with HBO and he is scheduled for myringotomy tubes on Friday 11/16/2016 -- he had his myringotomy tubes placed on Friday and has been doing much better after that with some fluid draining out after hyperbaric treatment today. The pain was much better. 11/30/2016 -- over the last 2 days he noticed a swelling and change of color of his right second toe and this had been draining minimal fluid. 12/07/2016 -- x-ray of the right foot -- IMPRESSION:1. Progressive ulceration at the distal aspect of the second digit with significant soft tissue swelling and osseous changes in the distal phalanx compatible with osteomyelitis. 2. Chronic osteomyelitis at the first MTP joint. 3. Ulcerations at the second and third toes as well without definite osseous changes. Osteomyelitis is not excluded. 4. Fifth digit  amputation. On 12/01/2016 I spoke to Dr. Bobby Rumpf, the infectious disease specialist, who kindly agreed to treat this with IV antibiotics and he would call in the order to the dialysis center and this has been discussed in detail with the patient who will make the appropriate arrangements. The patient will also book an appointment as soon as possible to see Dr. [name] in the office. Of note the patient has not been on antibiotics this entire week as the dialysis center did not receive any orders from Dr. [name]. I got in touch with Dr. [name] who tells me the patient has an appointment to see him this coming Wednesday. 12/14/2016 -- the patient is on ceftazidime and vancomycin during his dialysis, and I understand this was put on by his nephrologist Dr. Raliegh Ip possibly after speaking with infectious disease Dr. [name] 12/23/16; patient was on my schedule today for a wound evaluation as he had difficulties with his schedule earlier this week. I note that he is on vancomycin and ceftazidine at dialysis. In spite of this he arrives today with a new wound on the base  of the right second toe this easily probes to bone. The known wound at the tip of the second toe With this area. He also has a superficial area on the medial aspect of the third toe on the side of the DIP. This does not appear to have much depth. The area on the plantar left foot is a deep area but did not probe the bone 12/28/16 -- he has brought in some lab work and the most recent labs done showed a hemoglobin of 12.1 hematocrit of 36.3, neutrophils of 51% WBC count of 5.6, BN of 53, albumin of 4.1 globulin of 3.7, vancomycin 13 g per mL. 01/04/2017 -- he saw Dr. [name] who has recommended a amputation of the right second toe and he is awaiting this date. He sees Dr. Bobby Rumpf of infectious disease tomorrow. The bone culture taken on 12/28/2016 had no growth in 2 days. 01/11/2017 -- was seen by Dr. Bobby Rumpf regarding the  management and has recommended a eval by vascular surgeons. He recommended to continue the antibiotics during his hemodialysis. Xray of the left foot -- IMPRESSION: No acute fracture, dislocation, or osseous erosion identified. 01/18/2017 -- he has his vascular workup later today and he is going to have his right foot second toe amputation this coming Friday by Dr. [name]. We have also put in for a another 30 treatments with hyperbaric oxygen therapy 01/25/2017 -- I have reviewed Dr. Donnetta Hutching is vascular report from last week where he reviewed him and thought his vascular function was good enough to heal his amputation site and no further tests were recommended. He also had his orthopedic related to surgery which is still pending the notes and we will review these next week. 02/01/2017 -- the operative remote of Dr. 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 02/08/2017 --he was seen by Dr. [name] on 02/03/2017 -- patient is on [place]. after a thorough review he had recommended to continue with antibiotics during hemodialysis. The patient was seen by Dr. [name], but I do not find any follow-up note on the electronic medical record. he has removed the dressing over the right foot to remove the sutures and asked him to see him back in a month's time 02/22/2017 -- patient has been febrile and has been having symptoms of the upper respiratory tract infection but has not been checked for the flu and it's been over 4 days now. He says he is feeling better today. Some drainage between his left first and second toe and this needed to be looked  at 03/08/2017 -- was seen by Dr. Bobby Rumpf on 03/07/2017 after review he will stop his antibiotics and see him back in a month to see if he is feeling well. 03/15/2017 -- he has completed his course of hyperbaric oxygen therapy and is doing fine with his health otherwise. 03/22/2017 -- his nutritionist at the dialysis center has recommended a protein supplement to help build his collagen and we will prescribe this for him when he has the details 05/03/2017 -- he recently noticed the area on the plantar aspect of his fifth metatarsal head which opened out and had minimal drainage. 05/10/2017 -- --  x-ray of the left foot -- IMPRESSION:1. No convincing conventional radiographic evidence of active osteomyelitis.2. Active soft tissue ulceration at the tip of the fifth digit, and at the lateral aspect of the foot adjacent the base of the fifth metatarsal. 3. Surgical changes of prior fourth toe amputation. 4. 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 5. Small vessel atherosclerotic vascular calcifications. 6. 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 ======= Readmission after 5 weeks: 06/15/2017 -- the patient returns after 5 weeks having had an MRI done on 05/17/2017 MRI of the left foot without contrast showed soft tissue ulcer overlying the base of the fifth metatarsal. 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 No drainable fluid collection to suggest an abscess. He was in hospital between 05/16/2017 and 05/25/2017 -- and on discharge was asked to follow-up with Dr. [name] and Dr. [name]. He was treated 2 weeks post discharge with vancomycin plus ceftriaxone with hemodialysis and oral metronidazole 500 mg 3 times a day. 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 last hemoglobin A1c was  6.7% He had a postoperative MR of the foot on 05/23/2017 -- which showed no new areas of cortical bone loss, edema or soft tissue ulceration to suggest osteomyelitis. the patient has completed his course of IV antibiotics during dialysis and is to see the infectious disease doctor tomorrow. Dr. [name] had seen him after suture removal and asked him to keep the wound with a dry dressing 06/21/2017 -- he was seen by Dr. Bobby Rumpf of infectious disease on 06/16/2017 -- he stopped his Flagyl and will see him back in 6 weeks. He was asked to follow-up with Dr. [name] and with the wound clinic clinic. 07/05/17; bone biopsy from last time showed acute osteomyelitis. This is from the reminiscent in the left fifth metatarsal. Culture result is apparently still pending [holding for anaerobe}. From my understanding in this case this is been a progressive necrotic wound which is deteriorated markedly over the last 3 weeks since he returned here. He now has a large area of exposed bone which was biopsied and cultured last week. Dr. Graylon Good has put him on vancomycin and Fortaz during his hemodialysis and Flagyl orally. He is to see Dr. [name] next week 07/19/2017 -- the patient was reviewed by Dr. [name] of orthopedics who reviewed the case in detail and agreed with the plan to continue with IV antibiotics, aggressive wound care and hyperbaric oxygen therapy. He would see him back in 3 weeks' time 08/09/2017 -- saw Dr. Bobby Rumpf on 08/03/2017 -he was restarted on his antibiotics for 6 weeks which included vanco, ceftaz and Flagyl. he recommended continuing his antibiotics for 6 weeks and reevaluate his completion date. He would continue with wound care and hyperbaric oxygen therapy. 08/16/2017 -- I understand he will be completing his 6 weeks of antibiotics sometime later this week. 09/19/2017 -- he has been without his wound VAC for the last week due to lack of supplies. He has been packing his wound  with silver alginate. 10/04/2017 -- he has an appointment with Dr. [name] tomorrow and hence we will not apply the wound VAC after his dressing changes today. 10/11/2017 -- he was seen by Dr. Bobby Rumpf on 10/05/2017, noted that the patient was currently off antibiotics, and after thorough review he recommended he follow-up with the wound  care as per plan and no further antibiotics at this point. 11/29/17; patient is arrived for the wound on his left lateral foot.[name] He is completed IV antibiotics and 2 rounds of hyperbaric oxygen for treatment of underlying osteomyelitis. He arrives today with a surface on most of the wound area however our intake nurse noted drainage from the superior aspect. This was brought to my attention. 12/13/2017 -- the right plantar foot had a large bleb and once the bleb was opened out a large callus and subcutaneous debris was removed and he has a plantar ulcer near the fourth metatarsal head 12/28/17 on evaluation today patient appears to be doing acutely worse in regard to his left foot. The wound which has been appearing to do better as now open up more deeply there is bone palpable at the base of the wound unfortunately. He tells me that this feels "like it did when he had osteomyelitis previously" he also noted that his second toe on the left foot appears to be doing worse and is swollen there does appear to be some fluid collected underneath. His right foot plantar ulcer appears to be doing somewhat better at this point and there really is no complication at the site currently. No fevers, chills, nausea, or vomiting noted at this time. Patient states that he normally has no pain at this site however. T oday he is having significant pain. 01/03/18; culture done last week showed methicillin sensitive staph aureus and group B strep. He was on Septra however he arrives today with a fever of 101. He has a functional dialysis shunt in his left arm but has had no pain here.  No cough. He still makes some urine no dysuria he did have abdominal pain nausea and diarrhea over the weekend but is not had any diarrhea since yesterday. Otherwise he has no specific complaints 01/05/18; the patient returns today in follow-up for his presentation from 1/8. At that point he was febrile. I gave him some Levaquin adjusted for his dialysis status. He tells me the fever broke that night and it is not likely that this was actually a wound infection. He is gone on to have an MRI of the left foot; this showed interval development of an abnormal signal from the base of the third and fourth metatarsal and in the cuboid reminiscent consistent with osteomyelitis. There is no mention of the left second toe I think which was a concern when it was ordered. The patient is taking his Levaquin 01/10/18; the patient has completed his antibiotics today. The area on the left lateral foot is smaller but it still probes easily to bone. He has underlying osteomyelitis here and sees Dr. Megan Salon of infectious disease this week The area on the right third plantar metatarsal head still requires debridement not much change in dimensions He has a new wound on the medial tip of his right third toe again this probes to bone. He only noticed this 2 days ago 01/17/18; the patient saw Dr. [name] last week and he is back on Vanco and Fortaz at dialysis. 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 We are previously seeing him for a wound also on the base of the fourth med head on the right. X-ray of this area did not show osteomyelitis in relation to a new wound on the tip of his right third toe. Again  this probes to bone. Finally his second toe on the left which didn't really show anything on the MRI at least no report appears to have a separated cutaneous area which I think is going to come right off the tip  of his toe and leave exposed bone. Whether this is infectious or ischemic I am not clear Culture I did from the third toe last week which was his new wound was negative. Plain x-ray on the right foot did not show osteomyelitis in the toes 01/24/18; the patient is going went to see Dr. [name]. He is going to have a amputation of the left second and right third toe although paradoxically the left second toe looks better than last week. We have treating a deep probing wound on the left lateral foot and the area over the third metatarsal head on the right foot. 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 Also a debridement including bone of the left lateral foot and a closure which is still sutured. This is a bit surprising. Also apparent debridement of the base of the right third toe wound. 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 I'm not sure exactly where we are in the frame of things here. The wound on the left lateral foot is still sutured also the amputation sites of the left second and right third toe. He is been using Xeroform to the sutured areas silver alginate to the right foot 02/14/18; he continues on Rocephin at dialysis for osteomyelitis. I still don't have a good sense of where we are in the treatment duration. The wounds on the left lateral foot had the sutures removed and this clearly still probes deeply. We debrided the area with a #5 curet. We'll use silver alginate to the wound the area over the right third met head also required debridement of callus skin and subcutaneous tissue and necrotic debris over the wound surface. This tunnels superiorly but I did not unroofed this today. The areas on the tip of his toes have a crusted surface eschar I did not  debridement this either. 02/21/18; he continues on Rocephin at dialysis for osteomyelitis. I don't have a good sense of where we are and the treatment duration. The wounds on the left lateral foot is callused over on presentation but requires debridement. The area on the right third med head plantar aspect actually is measuring smaller 02/28/18;patient is on Vanco and Fortaz at dialysis, I'm not sure why I thought this was Rocephin above unless it is been changed. I don't have a good sense of time frame here. Using silver collagen to the area on the left lateral foot and right third med head plantar foot 03/07/18; patient is completing bank and Fortaz at dialysis soon. He is using Silver collagen to the area on the left lateral foot and the right third metatarsal head. He has a smaller superficial wound distal to the left lateral foot wound. 03/14/18-he is here in follow-up evaluation for multiple ulcerations to his bilateral feet. He presents with a new ulceration to the plantar aspect of the left foot underneath the blister, this was deroofed to reveal a partial thickness ulcer. He is voicing no complaints or concerns, tolerated dialysis yesterday. We will continue with same treatment plan and  he will follow-up next week 03/21/18; the patient has a area on the lateral left foot which still has a small probing area. The overall surface area of the wound is better. He presented with a new ulceration on the left foot plantar fifth metatarsal head last week. The area on the right third metatarsal head appears smaller.using silver alginate to all wound areas. The patient is changing the dressing himself. He is using a Darco forefoot offloading are on the right and a healing sandal on the left 03/28/18; original wound left lateral foot. Still nowhere close to looking like a heeling surface secondary wound on the left lateral foot at the base of his question fifth metatarsal which was new last week Right third  metatarsal head. We have been using silver alginate all wounds 04/04/18; the original wound on the left lateral foot again heavy callus surrounding thick nonviable subcutaneous tissue all requiring debridement. The new wound from 2 weeks ago just near this at the base of the fifth metatarsal head still looks about the same Unfortunately there is been deterioration in the third medical head wound on the right which now probes to bone. I must say this was a superficial wound at one point in time and I really don't have a good frame of reference year. I'll have to go back to look through records about what we know about the right foot. 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 He is also been to see Dr. [name]. Somewhere in here as somebody is ordered a VASCULAR evaluation which was done on 03/22/18. On the right is anterior tibial artery was monophasic triphasic at the posterior tibial artery. On the left anterior tibial artery monophasic posterior tibial artery monophasic. ABI in the right was 1.43 on the left 1.21. TBIs on the right at 0.41 and on the left at 0.32. A vascular consult was recommended and I think has been arranged. 04/11/18; Mr. Sawchuk has not had an MRI of the right foot since 2017. Recent x-ray of the right foot done in January and February was negative. However he has had a major deterioration in the wound over the third met head. He is completed antibiotics last week at dialysis [place]. I think he is going to see vascular in follow-up. The areas on the left lateral foot and left plantar fifth metatarsal head both look satisfactory. I debrided both of these areas although the tissue here looks good. The area on the left lateral foot once probe to bone it certainly does not do that now 04/18/18; MRI of the right foot is on Thursday. He has what looks to be serosanguineous purulent  drainage coming out of the wound over the right third metatarsal head today. He completed antibiotics bank and Fortaz 2 weeks ago at dialysis. He has a vascular follow-up with regards to his arterial insufficiency although I don't exactly see when that is booked. The areas on the left foot including the lateral left foot and the plantar left fifth metatarsal head look about the same. 04/25/18-He is here in follow-up evaluation for bilateral foot ulcers. MRI obtained was negative for osteomyelitis. Wound culture was negative. We will continue with same treatment plan he'll follow-up next week 05/02/18; the right plantar foot wound over the third metatarsal head actually looks better than when I last saw this. His MRI was negative for osteomyelitis wound culture was negative. On the  left plantar foot both wounds on the plantar fifth metatarsal head and on the lateral foot both are covered in a very hard circumferential callus. 05/09/18; right plantar foot wound over the third metatarsal head stable from last week. Left plantar foot wound over the fifth metatarsal head also stable but with callus around both wound areas The area on the left lateral foot had thick callus over a surface and I had some thoughts about leaving this intact however it felt boggy. We've been using silver alginate all wounds 05/16/18; since the patient was last here he was hospitalized from 5/15 through 5/19. 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 This grew MRSA. He was placed on vancomycin.MRI of the left foot suggested "progressive" osteomyelitis and bone destruction of the cuboid and the base of the fifth metatarsal. Stable erosive changes of the base of the second metatarsal. I'm wondering if they're aware that he had surgery and debridement in the area of the underlying bone previously by Dr. [name]. In any case, He was  also revascularized with an angioplasty and stenting of the left tibial peroneal trunk and angioplasty of the left posterior tibial artery. He is on vancomycin at dialysis. He has a 2 week follow-up with infectious disease. He has vascular surgery follow-up. He was started on Plavix. 05/23/18; the patient's wound on the lateral left foot at the level of the fifth metatarsal head and the plantar wound on the plantar fifth metatarsal head both look better. The area on the right third metatarsal head still has depth and undermining. We've been using silver alginate. He is on vancomycin. He has been revascularized on the left 05/30/18; he continues on vancomycin at dialysis. Revascularized on the left. The area on the left lateral foot is just about closed. Unfortunately the area over the plantar fifth metatarsal head undermining superiorly as does the area over the right third metatarsal head. Both of these significantly deteriorated from last week. He has appointments next week with infectious disease and orthopedics I delayed putting on a cast on the left until those appointments which therefore we made we'll bring him in on Friday the 14th with the idea of a cast on the left foot 06/06/18; he continues on vancomycin at dialysis. Revascularized on the left. He arrives today with a ballotable swelling just above the area on the left lateral foot where his previous wound was. He has no pain but he is reasonably insensate. The difficult areas on the plantar left fifth metatarsal head looks stable whereas the area on the right third plantar metatarsal head about the same as last week there is undermining here although I did not unroofed this today. He required a considerable debridement of the swelling on the left lateral foot area and I unroofed an abscess with a copious amount of brown purulent material which I obtained for culture. I don't believe during his recent hospitalization he had any further imaging  although I need to review this. Previous cultures from this area done in this clinic showed MRSA, I would be surprised if this is not what this is currently even though he is on vancomycin. 06/13/18; he continues on vancomycin at dialysis however he finishes this on Sunday and then graduates the doxycycline previously prescribed by Dr. [name]. The abscess site on the lateral foot that I unroofed last time grew moderate amounts of methicillin-resistant staph aureus that  is both vancomycin and tetracycline sensitive so we should be okay from that regard. He arrives today in clinic with a connection between the abscess site and the area on the lateral foot. We've been using silver alginate to all his wound areas. The right third metatarsal head wound is measuring smaller. Using silver alginate on both wound areas 06/20/18; transitioning to doxycycline prescribed by Dr. [name]. The abscess site on the lateral foot that I unroofed 2 weeks ago grew MRSA. That area has largely closed down although the lateral part of his wound on the fifth metatarsal head still probes to bone. I suspect all of this was connected. On the right third metatarsal head smaller looking wound but surrounded by nonviable tissue that once again requires debridement using silver alginate on both areas 06/27/18; on doxycycline 100 twice a day. The abscess on the lateral foot has closed down. Although he still has the wound on the left fifth metatarsal head that extends towards the lateral part of the foot. The most lateral part of this wound probes to bone Right third metatarsal head still a deep probing wound with undermining. Nevertheless I elected not to debridement this this week If everything is copious static next week on the left I'm going to attempt a total contact cast 07/04/18; he is tolerating his doxycycline. The abscess on the left lateral foot is closed down although there is still a deep wound here that probes to bone. We  will use silver collagen under the total contact cast Silver collagen to the deep wound over the right third metatarsal head is well 07/11/18; he is tolerating doxycycline. The left lateral fifth plantar metatarsal head still probes deeply but I could not probe any bone. We've been using silver collagen and this will be the second week under the total contact cast Silver collagen to deep wound over the right third metatarsal head as well 07/18/18; he is still taking doxycycline. The left lateral fifth plantar metatarsal this week probes deeply with a large amount of exposed bone. Quite a deterioration. He required extensive debridement. Specimens of bone for pathology and CNS obtained Also considerable debridement on the right third metatarsal head 07/25/18; bone for pathology last week from the lateral left foot showed osteomyelitis. CandS of the bone Enterobacter. And Klebsiella. He is now on ciprofloxacin and the doxycycline is stopped [Dr. [name] of infectious disease] area He has an appointment with Dr. [name] tomorrow I'm going to leave the total contact cast off for this week. May wish to try to reapply that next week or the week after depending on the wound bed looks. I'm not sure if there is an operative option here, previously is followed with Dr. [name] 08/01/18 on evaluation today patient presents for reevaluation. He has been seen by infectious disease, Dr. [name], and he has placed the patient back on Ceftaz currently to be given to him at dialysis three times a week. 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 Fortunately there does not appear to be the evidence of infection at this point in time. Overall the patient has been tolerating the dressing changes without complication. We are no longer due lives in the cast of left foot secondary to the infection obviously. 08/10/18 on evaluation today patient appears to be  doing rather well all things considered in regard to his left plantar foot ulcer. He does have a significant callous on the lateral portion of  his left foot he wonders if I can help clean that away to some degree today. Fortunately he is having no evidence of infection at this time which is good news. No fevers chills noted. With that being said his right plantar foot actually does have a significant callous buildup around the wound opening this seems to not be doing as well as I would like at this point. I do believe that he would benefit from sharp debridement at this site. Subsequently I think a total contact cast would be helpful for the right foot as well. 08/17/18; the patient arrived today with a total contact cast on the right foot. This actually looks quite good. He had gone to see Dr. Megan Salon of infectious disease about the osteomyelitis on the left foot. I think he is on IV Fortaz at dialysis although I am not exactly sure of the rationale for the Fortaz at the time of this dictation. He also arrived in today with a swelling on the lateral left foot this is the site of his original wounds in fact when I first saw this man when he was under Dr. Ardeen Garland care I think this was the site of where the wound was located. It was not particularly tender however using a small scalpel I opened it to [place] some moderate amount of purulent drainage. 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 Last MRI was in May that showed progressive osteomyelitis and bone destruction of the cuboid and base of the fifth metatarsal. Last week he had an abscess over this same area this was removed. Culture was negative. We are continuing with a total contact cast to the area on the third metatarsal head on the right and making some good progress here. The patient asked me again about renal  transplant. He is not on the list because of the open wounds. 08/31/18; apparently the patient had an interruption in the [place] but he is now back on this at dialysis. Apparently there was a misunderstanding and Dr. Crissie Figures orders. Due to have the MRI of the left foot tonight. The area on the right foot continues to have callus thick skin and subcutaneous tissue around the wound edge that requires constant debridement however the wound is smaller we are using a total contact cast in this area. Alginate all wounds 09/07/18; without much surprise the MRI of his left foot showed osteomyelitis in the reminiscent of his fourth metatarsal but also the third metatarsal. She arrives in clinic today with the right foot and a total contact cast. There was purulent drainage coming out of the wound which I have cultured. Marked deterioration here with undermining widely around the wound orifice. Using pickups and a scalpel I remove callus and subcutaneous tissue from a substantial new opening. In a similar fashion the area on the left lateral foot that was blistered last week I have opened this and remove skin and subcutaneous tissue from this area to expose a obvious new wound. I think there is extension and communication between all of this on the left foot. The patient is on Fortaz at dialysis. Culture done of the right foot. We are clearly not making progress here. We have made him an appointment with Dr. [name] of orthopedics. I think an amputation of the left leg may be discussed. I don't think there is anything that can be done with  foot salvage. 09/14/18; culture from the right foot last time showed a very resistant MRSA. This is resistant to doxycycline. I'm going to try to get linezolid at least for a week. He does not have a appointment with Dr. [name] yet. 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 I'll send a message to Dr. [name] of  infectious disease. I want the patient to see Dr. [name] to go over the pros and cons of an amputation which I think will be a BKA. The patient had a question about whether this is curative or not. I told him that I thought it would be although spread of staph aureus infection is not unheard of. MRI of the right foot was done at the end of April 2019 did not show osteomyelitis. The patient last saw Dr. [name] on 9/3. I'll send Dr. [name] a message. I would really like him to weigh the pros and cons of a BKA on the left otherwise he'll probably need IV antibiotics and perhaps hyperbaric oxygen again. He has not had a good response to this in the past. His MRI earlier this month showed progressive damage in the remnants of the fourth and third metatarsal 09/21/2018; sees Dr. [name] of orthopedics next week to discuss a left BKA in response to the osteomyelitis in the left foot. Using silver alginate to all wound areas. He is completing the Zyvox I did put in for him after last culture showed MRSA 09/29/2018; patient saw Dr. [name] of orthopedics discussed the osteomyelitis in the left foot third and fourth metatarsals. He did not recommend urgent surgery but certainly stated the only surgical option would be a BKA. He is communicating with Dr. [name]. The problem here is the instability of the areas on his foot which constantly generate draining abscesses. I will communicate with Dr. [name] about this. He has an appointment on 10/23 10/05/2018; patient sees Dr. [name] on 10/23. I have sent him a secure message not ordered any additional antibiotics for now. Patient continues to have 2 open areas on the left plantar foot at the fifth metatarsal head and the lateral aspect of the foot. These have not changed all that much. The area on the right third met head still has thick callus and surrounding subcutaneous tissue we have been using silver alginate 10/12/2018; patient sees Dr. [name] or colleague on  10/23. 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 The area on the right plantar foot is just about closed. The superior wound on the left fifth metatarsal head is callused over but I am not sure if this is closed. The area below it is about the same. Considerable amount of callus on the lateral foot. We have been using silver alginate to the wounds 10/19/2018; the patient saw Dr. [name] on 10/22. He wishes to try and continue to save the left foot. He has been given 6 weeks of oral ciprofloxacin. We continue to put a total contact cast on the right foot. The area over the fifth metatarsal head on the left is callused over/perhaps healed but I did not remove the callus to find out. He still has the wound on the left lateral midfoot requiring debridement. We have been using silver alginate to all wound areas 10/26/2018 On the left foot our intake nurse noted some purulent drainage from the inferior wound [currently the only one that is  not callused over] On the right foot even though he is in total contact cast a considerable amount of thick black callus and surface eschar. On this side we have been using silver alginate under a total contact cast. he remains on ciprofloxacin as prescribed by Dr. [name] 11/02/2018; culture from last week which is done of the probing area on the left midfoot wound showed MRSA "few". I am going to need to contact Dr. [name] which I will do today I think he is going to need IV antibiotics again. The area on the left foot which was callused on the side is clearly separating today all of this was removed a copious amounts of callus and necrotic subcutaneous tissue. The area on the right plantar foot actually remained healthy looking with a healthy granulated base. We are using silver alginate to all wound areas 11/09/2018; unfortunately neither 1 of the patient's wounds areas looks at all satisfactory. On  the left he has considerable necrotic debris from the plantar wound laterally over the foot. I removed copious amounts of material including callus skin and subcutaneous tissue. On the right foot he arrives with undermining and frankly purulent drainage. Specimen obtained for culture and debridement of the callused skin and subcutaneous tissue from around the circumference. Because of this I cannot put him back in a total contact cast but to be truthful we are really unfortunately not making a lot of progress I did put in a secure text message to Dr. [name] wondering about the MRSA on the left foot. He suggested vancomycin although this is not started. I was left wondering if he expected me to send this into dialysis. Has we have more purulence on the right side wait for that result before calling dialysis 11/16/2018; I called dialysis earlier this week to get the vancomycin started. I think he got the first dose on Tuesday. The patient tells me that he fell earlier this week twisting his left foot and ankle and he is swelling. He continues to not look well. He had blood cultures done at dialysis on Tuesday he is not been informed of the results. 12/07/2018; the patient was admitted to hospital from 11/22/2018 through 12/02/2018. He underwent a left BKA. Apparently had staph sepsis. In the meantime being off the right foot this is closed over. He follows up with Dr. 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 Subsequent to this he actually had multiple falls in fact he tells me to following which calls the wound to be his apparently. He has been seeing Dr. [name] and his physician assistant in the interim. They actually did want him to come back for reevaluation to see if there's anything we can do for me wound care perspective the help this area to heal more appropriately. The patient  states that he does not have a tremendous amount of pain which is good news. No fevers, chills, nausea, or vomiting noted at this time. We have gotten approval from Dr. [name] office had [place] for [place] to treat the patient for his stop wound. This is due to the fact that the patient was in the 90 day postop global. 1/21; the patient does not have an open wound on the right foot although he does have some pressure areas that will need to be padded when he is transferring. The dehisced surgical wound looks clean although there is some undermining of  1.5 cm superiorly. 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 This appears healthy but there is undermining. TheraSkin #1 2/11; TheraSkin #2 2/25; TheraSkin #3. Wound is measuring smaller 3/10; TheraSkin #4 wound is measuring smaller 3/24; TheraSkin #5 wound is measuring smaller and looks healthy. 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 He has no open area on the right foot although he does have some callused area over the original third metatarsal head wound. The third metatarsal is also subluxed on the right foot. I have warned him today that he cannot consider wearing a prosthesis for at least a month but he can go for measurements. He is going to need to keep the right foot padded is much as possible in his diabetic shoe indefinitely READMISSION 06/12/2019 Mr. Pagan is a type II diabetic on dialysis. He has been in this clinic multiple times with wounds on his bilateral lower extremities. 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 He also has a history of wounds on his right foot but tells me that everything is going well here. He actually came in here with his prosthesis walking with a cane. We were all really quite gratified to see  this He states over the last several weeks he has had a painful area at the tip of the left third finger. His dialysis shunt in his is in the left upper arm and this particularly hurts during dialysis when his fingers get numb and there is a lot of pain in the tip of his left third finger. I believe he is seen vascular surgery. He had a Doppler done to evaluate for a dialysis steal syndrome. He is going for a banding procedure 1 week from today towards the left AV fistula. I think if that is unsuccessful they will be looking at creating a new shunt. He had a course of doxycycline when he which he finished about a week ago. He has been using Bactroban to the finger 6/25; the patient had his banding procedure and things seem to be going better. He is not having pain in his hand at dialysis. The area on the tip of his finger seems about closed. He did however have a paronychia I the last time I saw him. I have been advising him to use topical Bactroban washing the finger. Culture I did last time showed a few staph epidermidis which I was willing to dismiss as superficial skin contaminant. He arrives today with the finger wound looking better however the paronychia a seems worse 7/9; 2-week follow-up. He does not have an open wound on the tip of his third left finger. I thought he had an initial ischemic wound on the tip of the finger as well as a paronychia a medially. All of this appears to be closed. 8/6-Patient returns after 3 weeks for follow-up and has a right second met head plantar callus with a significant area of maceration hiding an ulcer ABI repeated today on right - 1.26 8/13-Patient returns at 1 week, the right second met head plantar wound appears to be slightly better, it is surrounded by the callus area we are using silver alginate 8/20; the patient came back to clinic 2 weeks ago with a wound on the right second metatarsal head. He apparently  told me he developed callus in this area  but also went away from his custom shoes to a pair of running shoes. Using silver alginate. 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 There might be much we can do to offload this although the patient tells me he has a wheelchair that he is using to try and stay off the wound is much as possible 8/27; right second metatarsal head. Still small punched out area with thick callus and subcutaneous tissue around the wound. We have been using silver collagen. This is always been an issue with this man's wound especially on this foot 9/3; right second met head. Still the same punched-out area with thick callus and subcutaneous tissue around the wound removing the circumference demonstrates repetitively undermining area. I have removed all the subcutaneous tissue associated with this. We have been using silver collagen 9/15; right second metatarsal head. Same punched-out thick callus and subcutaneous tissue around the wound area. Still requiring debridement we have been using silver collagen I changed back to silver alginate X-ray last time showed no evidence of osteomyelitis. Culture showed Klebsiella and he was started and Keflex apparently just 4 days ago 9/24; right second metatarsal head. He is completed the antibiotics I gave him for 10 days. Same punched-out thick callus and subcutaneous tissue around the area. Still requiring debridement. We have been using silver alginate. The area itself looks swollen and this could be just [place] that comes on this area with walking on a prosthesis on the left however I think he needs an MRI and I am going to order that today. There is not an option to offload this further 10/1; right second plantar metatarsal head. MRI booked for October 6. Generally looking better today using silver alginate 10/8; right second plantar metatarsal head. MRI that was done on 10/6 was negative for osteomyelitis noted prior amputations of the second and fourth  toes at the MTP. Prior amputation of the third toe to the base of the middle phalanx. Prior amputation of the fifth toe to the head of the metatarsal. 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 He does not recall about hearing about this. He did not have any pain but then again he has reduced sensation from diabetic neuropathy 10/15; right second plantar metatarsal head. Still in the same condition callus nonviable subcutaneous tissue which cleans up nicely with debridement but reforms by the next week. The patient tells me that he is offloading this is much as he can including taking his wheelchair to dialysis. We have been using silver alginate, changed to silver collagen today 10/22; right second plantar metatarsal head. Wound measures smaller but still thick callus around this wound. Still requiring debridement 11/5 right second plantar metatarsal head. Less callus around the wound circumference but still requiring debridement. There is still some undermining which I cleaned out the wound looks healthy but still considerable punched out depth with a relatively small circumference to the wound there is no palpable bone no purulent drainage 11/12; right second plantar metatarsal head. Small wound with thick callus tissue around the wound and significant undermining. We have been using silver collagen after my continuous debridements of this area 11/19; patient arrives in today with purulent drainage coming out of the wound in the second third met head area on the right foot. Serosanguineous thick drainage. Specimen obtained for culture. 11/21/2019 on evaluation today patient appears to be doing  well with regard to his foot ulcer. The good news is is culture came back negative for any bacteria there was no growth. The other good news is his x-ray also appears to be doing well. The foot is also measuring much better than what it was. Overall I am very  pleased with how things seem to be progressing. 12/22; patient was in [facility] for several weeks due to the death of a family member. He said he stayed off the wound using silver alginate. 01/03/2020. Patient has a small open area with roughly 0.3 cm of circumferential undermining. The orifice looks smaller. We have been using silver collagen. There is not a wound more aggressive way to offload this area as he has a prosthesis on the other leg 1/21; again the same small area is 2 weeks ago. We have been using silver collagen I change that to endoform today I really believe that this is an offloading issue 2/4; the area on the right third met head. We have been using endoform. 2/11 the area on the right third met head we have been using silver alginate. 2/25; the area on the right third met head. There is much less callus on this today. The patient states he is offloading this even more aggressively. As an example he is taking his wheelchair into dialysis on most days 3/4; right third metatarsal head. Again he has eschar over the surface of the wound which I removed with a #3 curette. Some subcutaneous debris. This has 0.8 cm of direct probing depth. I cannot feel any bone here. I did do a culture the area 3/11; right third metatarsal head. Again an eschared area over the wound which almost makes this look superficial. Again debridement reveals a wound with probing depth probably not much different from last week there is no palpable bone no palpable purulence. The patient tells me that he is making every effort to offload this area properly. He is taking wheelchair into dialysis etc. There is no option for forefoot offloading or a total contact cast. We used Oasis #1 today 3/18; again callus and eschar over the wound surface. I remove this but still a probing wound although only 3 mm in depth this time. This is an improvement 3/25; again callus and eschar over the wound surface which I  removed the wound is much deeper today at 0.7 cm. There is no palpable bone. Because of the appearance of this a culture was done. I am not going to put the Oasis back in this. Concerned about underlying infection. Notable for the fact that he is just finished doxycycline for the last go round of this 3/30; still 0.7 cm in depth which is disappointing. Culture I gave last time showed a few Staph aureus which is methicillin susceptible. This should have been taken care of by the recent course of doxycycline I gave him. We are using silver alginate 4/6; almost 1 cm in depth. We have been using silver alginate with underlying Bactroban. 4/15; about 1 cm in depth still. There is no palpable bone but there is undermining thick surface again as usual. Our intake nurse noted what looked to be purulent drainage I suppose that could have been Bactroban but I did a culture for CandS 4/20; depth down 0.7 cm. Culture from last time was negative we have been using Bactroban and silver alginate 4/29; depth down 0.5 cm. Using silver alginate. He reports some drainage 5/6; comes in with the wound worse. Wound is  deeper and tunnels. Measures 0.9 depth and an undermining area of 1.8 cm. 5/18; there is a change the dressing last week. Still using Sorbact hydrogel. What I can see of the base of the wound looks better. 6/1. Not much change. I changed him to endoform today. He has 1.1 cm in depth no palpable bone 6/8; again a small punched out wound with significant undermining. Culture I did last week was negative. T oday he had some purulent drainage perhaps mixed with some retained endoform. 6/22; MRI report below IMPRESSION: Skin ulceration on the plantar surface of the foot at the level of the head of the second metatarsal. Mild marrow edema in the head of the second metatarsal could be reactive or due to osteomyelitis. Thin fluid collection surrounding the head and neck of the second metatarsal appears to  communicate with the patient's skin wound and is worrisome for a septic collection. Advanced first MTP osteoarthritis. Prior amputations as noted above. Electronically Signed By: Inge Rise M.D. On: 06/16/2020 11:51 PCR culture I did last time I believe showed peptostreptococcus. There was no comment on any other gram-negative or gram-positive organisms. I gave him a course of Flagyl which I think he is finishing up. We are not making any progress with the wound over the right second/third metatarsal head. We are using silver alginate putting him in kerlix Coban 6/29; has his appointment with Dr. [name] of orthopedics next Tuesday I think we see him on the same day. He was in for a nurse visit today. Nurse noted undermining of skin and callus so I was asked to look at this. We have been using silver alginate and kerlix Coban 7/6; patient saw Dr. [name] PA today will see Dr. [name] on Tuesday. The major question is does he needs surgery to clean out the area in question culture question resect any involved bone etc. Otherwise he will need to see infectious disease for broad-spectrum antibiotics given at dialysis 7/13; I talked to Dr. [name] late last week. He was not impressed with the MRI results thinking that the bone changes may have been reactive rather than osteomyelitis. He did not think he needed drainage of an abscess thinking of the fluid we are seeing was superficial and contiguous with the actual wound. He did not think he needed surgery or resection. Recommended consideration of infectious disease and return him to our care. We have been using silver alginate 7/20; PCR culture I did last week showed staph aureus methicillin sensitive. I have him on twice daily Keflex. Silver alginate to the wound 7/27; I have continued his 500 every 12 of cephalexin with a second dose after dialysis. My intention is to give him 4 to 6 weeks of antibiotics directed at [facility] 07/31/2020 on evaluation  today patient appears to be doing a little better in regard to his overall wound size. This is good news. He does have a fairly significant callus buildup still and I am can work on that today. Fortunately there is no signs of active infection at this time. He has been on Keflex and has an extension of that that he is going to pick up and continue taking. 08/07/20-Patient back at 1 week with right met head wound looking about the same, overall the callus removal has helped 8/19; right third met head. Again extensive callus removal with a 10 scalpel. There is a small remaining wound we have been using silver alginate Patient also comes in having a fall and an abrasion on his  left elbow just distally. This is a clean wound and should heal with simple measures 8/26; right third met head. This has a surface on this with some callus however this is as good as I have seen this looking quite some time. He had a fall last week with abrasion in his right forearm this is healed Electronic Signature(s) Signed: 08/21/2020 6:08:30 PM By: Linton Ham MD Entered By: Linton Ham on 08/21/2020 15:30:32 -------------------------------------------------------------------------------- Physical Exam Details Patient Name: Date of Service: [name] 08/21/2020 2:45 PM Medical Record Number: [id number] Patient Account Number: [id number] Date of Birth/Sex: Treating RN: [birth year] (69 y.o. [name] Primary Care Provider: [name] Other Clinician: Referring Provider: Treating Provider/Extender: Tanna Savoy in Treatment: 69 Constitutional Sitting or standing Blood Pressure is within target range for patient.. Pulse regular and within target range for patient.[name] Respirations regular, non-labored and within target range.. Temperature is normal and within the target range for the patient.[name] Appears in no distress. Notes Wound exam; right plantar third metatarsal head.  Thick callus over the wound area that I removed last week there is much less callus. There is a very superficial level of callus over where I think the small wound was. This looks satisfactory at this point and I was not going to aggressively debride this today. Electronic Signature(s) Signed: 08/21/2020 6:08:30 PM By: Linton Ham MD Entered By: Linton Ham on 08/21/2020 15:31:21 -------------------------------------------------------------------------------- Physician Orders Details Patient Name: Date of Service: ELIBERTO, SOLE 08/21/2020 2:45 PM Medical Record Number: [id number] Patient Account Number: [id number] Date of Birth/Sex: Treating RN: 1951/01/31 (69 y.o. 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 Skin Barriers/Peri-Wound Care Moisturizing lotion - leg and foot daily Primary Wound Dressing Foam - keep bottom of right foot padded with foam for protection Edema Control Avoid standing for long periods of time Elevate legs to the level of the heart or above for 30 minutes daily and/or when sitting, a frequency of: - throughout the day Electronic Signature(s) Signed: 08/21/2020 4:47:33 PM By: Levan Hurst RN, BSN Signed: 08/21/2020 6:08:30 PM By: Linton Ham MD Entered By: Levan Hurst on 08/21/2020 15:21:18 -------------------------------------------------------------------------------- Problem List  Details Patient Name: Date of Service: LENDELL, GALLICK 08/21/2020 2:45 PM Medical Record Number: [id number] Patient Account Number: [id number] Date of Birth/Sex: Treating RN: 14-Nov-1951 (69 y.o. 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, other extremity 06/12/2019 No Yes L03.115 Cellulitis of right lower limb 11/15/2019 No Yes S50.312D Abrasion of left elbow, subsequent encounter 08/14/2020 No Yes Inactive Problems ICD-10 Code Description Active Date Inactive Date L03.114 Cellulitis of left upper limb 06/12/2019 06/12/2019 L98.498 Non-pressure chronic ulcer of skin of other sites with other specified  severity 06/12/2019 06/12/2019 S61.209D Unspecified open wound of unspecified finger without damage to nail, subsequent 06/12/2019 06/12/2019 encounter Resolved Problems Electronic Signature(s) Signed: 08/21/2020 6:08:30 PM By: Linton Ham MD Entered By: Linton Ham on 08/21/2020 15:29:27 -------------------------------------------------------------------------------- Progress Note Details Patient Name: Date of Service: [name] 08/21/2020 2:45 PM Medical Record Number: [id number] Patient Account Number: [id number] Date of Birth/Sex: Treating RN: 1951/01/28 (69 y.o. [name] Primary Care Provider: [name] Other Clinician: Referring Provider: Treating Provider/Extender: Tanna Savoy in Treatment: 69 Subjective History of Present Illness (HPI) The following HPI elements were documented for the  patient's wound: Location: Patient presents with a wound to bilateral feet. Quality: Patient reports experiencing essentially no pain. Severity: Mildly severe wound with no evidence of infection Duration: Patient has had the wound for greater than 2 weeks prior to presenting for treatment Stomatitis cyst the patient is a pleasant 69 yrs old bm here for evaluation of ulcers on the plantar aspect of both feet. 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 He has a left arm graft for access. He has been trying to stay off his feet for weeks but does not seem to have any improvement in the wound. He has been seeing someone at the foot center and was referred to the Wound Care center for further evaluation. 12/30/15 the patient has 3 wounds one over the right first metatarsal head and 2 on the left foot at the left fifth and lleft first metatarsal head. All of these look relatively similar. The one over the left fifth probe to bone I could not prove that any of the others did. I note his MRI in November that did not show osteomyelitis. His peripheral pulses seem robust. All of these underwent surgical debridement to remove callus nonviable skin and subcutaneous tissue 01/06/16; the patient had his sutures removed from the right fifth ray amputation. There may be a small open part of this superiorly but otherwise the incision looks good. Areas over his right first, left first and left fifth metatarsal head all underwent surgical debridement as a varying degree of callus, skin and nonviable subcutaneous tissue. The area that is most worrisome is the right fifth metatarsal head which has a wound probes precariously close to bone. There is no purulent drainage or erythema 01/13/16; I'm not exactly sure of the status of the right fifth ray amputation site however he follows with Dr. [name] later this week. The area over his right first plantar metatarsal head,  left first and fifth plantar metatarsal head are all in the same status. Thick circumferential callus, nonviable subcutaneous tissue. Culture of the left fifth did not culture last week 01/20/16; all of the patient's wounds appear and roughly the same state although his amputation site on the right lateral foot looks better. His wounds over the right first, left first and left fifth metatarsal heads all underwent difficult surgical debridement removing circumferential callus nonviable skin and subcutaneous tissue. There is no overt evidence of infection in these areas. MRI at the end of December of the left foot did not show osteomyelitis, right foot showed osteomyelitis of the right fifth digit he is now status post amputation. 01/27/16 the patient's wounds over his plantar first and fifth metatarsal heads on the left all appear better having been started on a total contact cast last week. They were debridement of circumferential callus and nonviable subcutaneous tissue as was the wound over the  first metatarsal head. His surgical incision on the right also had some light surface debridement done. 03/23/2016 -- the patient was doing really well and most of his wounds had almost completely healed but now he came back today with a history of having a discharge from the area of his right foot on the plantar aspect and also between his first and second toe. He also has had some discharge from the left foot. Addendum: I spoke to the PA [name] at the dialysis center whose fax number is [phone]. We discussed the infection the patient has and she will put the patient on vancomycin and Fortaz until the final culture report is back. We will fax this as soon as available. 03/30/2016 -- left foot x-ray IMPRESSION:No definitive osteomyelitis noted. X-ray of the right foot -- IMPRESSION: 1. Soft tissue swelling. Prior amputation right fifth digit. No acute or focal bony abnormality identified. If  osteomyelitis remains a clinical concern, MRI can be obtained. 2. Peripheral vascular disease. His culture reports have grown an MSSA -- and we will fax this report to his hemodialysis center. He was called in a prescription of oral doxycycline but I have told her not to fill this in as he is already on IV antibiotics. 04/06/2016 -- a few days ago, I spoke to the hemodialysis center nurse who had stopped the IV antibiotics and he was given a prescription for doxycycline 100 mg by mouth twice a day for a week and he is on this at the present time. 05/11/2016 -- he has recently seen his PCP this week and his hemoglobin A1c was 7. He is working on his paperwork to get his orthotic shoes. 05/25/2016 -- -- x-ray of the left foot IMPRESSION:No acute bony abnormality. No radiographic changes of acute osteomyelitis. No change since prior study. X-ray of the right foot -- IMPRESSION: Postsurgical changes are seen involving the fifth toe. No evidence of acute osteomyelitis. he developed a large blister on the medial part of his right foot and this opened out and drain fluid. 06/01/2016 -- he has his MRI to be done this afternoon. 06/15/2016 -- MRI of the left forefoot without contrast shows 2 separate regions of cutaneous and subcutaneous edema and possible ulceration and blistering along the ball of the foot. No obvious osteomyelitis identified. MRI of the right foot showed cutaneous and subcutaneous thickening plantar to the first digit sesamoid with an ulcer crater but no underlying osteomyelitis is identified. 06/22/2016 -- the right foot plantar ulcer has been draining a lot of seropurulent material for the last few days. 06/30/2016 -- spoke to the dialysis center and I believe I spoke to Ely a PA at the center who discussed with me and agreed to putting Legrand Como on vancomycin until his cultures arrive. On review of his culture report no WBCs were seen or no organisms were seen and the culture was  reincubated for better growth. The final report is back and there were no predominant growth including Streptococcus or Staphylococcus. Clinically though he has a lot of drainage from both wounds a lot of undermining and there is further blebs on the left foot towards the interspace between his first and second toe. 08/10/2016 -- the culture from the right foot showed normal skin flora and there was no Staphylococcus aureus was group A streptococcus isolated. 09/21/2016 -- -- MRI of the right foot was done on 09/13/2016 - IMPRESSION: Findings most consistent with acute osteomyelitis throughout the great toe, sesamoid bones and plantar aspect  of the head of the first metatarsal. Fluid in the sheath of the flexor tendon of the great toe could be sympathetic but is worrisome for septic tenosynovitis. First MTP joint effusion worrisome for septic joint. He was admitted to the hospital on 09/11/2016 and treated for a fever with vancomycin and cefepime. He was seen by Dr. Bobby Rumpf of infectious disease who recommended 6 weeks treatment with vancomycin and ceftazidime with his hemodialysis and to continue to see as in the wound clinic. Vascular consult was pending. The patient was discharged home on 09/14/2016 and he would continue with IV antibiotics for 6 weeks. 09/28/16 wound appears reasonably healthy. Continuing with total contact cast 10/05/16 wound is smaller and looking healthy. Continue with total contact cast. He continues on IV antibiotics 10/12/2016 -- he has developed a new wound on the dorsal aspect of his left big toe and this is a superficial injury with no surrounding cellulitis. He has completed 30 days of IV antibiotics and is now ready to start his hyperbaric oxygen therapy as per his insurance company's recommendation. 10/19/2016 -- after the cast was removed on the right side he has got good resolution of his ulceration on the right plantar foot. he has a new wound on the left  plantar foot in the region of his fourth metatarsal and this will need sharp debridement. 10/26/2016 -- Xray of the right foot complete: IMPRESSION: Changes consistent with osteomyelitis involving the head of the first metatarsal and base of the first proximal phalanx. The sesamoid bones are also likely involved given their positioning. 11/02/2016 -- there still awaiting insurance clearance for his hyperbaric oxygen therapy and hopefully he will begin treatment soon. 11/09/2016 -- started with hyperbaric oxygen therapy and had some barotrauma to the right ear and this was seen by ENT who was prescribed Afrin drops and would probably continue with HBO and he is scheduled for myringotomy tubes on Friday 11/16/2016 -- he had his myringotomy tubes placed on Friday and has been doing much better after that with some fluid draining out after hyperbaric treatment today. The pain was much better. 11/30/2016 -- over the last 2 days he noticed a swelling and change of color of his right second toe and this had been draining minimal fluid. 12/07/2016 -- x-ray of the right foot -- IMPRESSION:1. Progressive ulceration at the distal aspect of the second digit with significant soft tissue swelling and osseous changes in the distal phalanx compatible with osteomyelitis. 2. Chronic osteomyelitis at the first MTP joint. 3. Ulcerations at the second and third toes as well without definite osseous changes. Osteomyelitis is not excluded. 4. Fifth digit amputation. On 12/01/2016 oo I spoke to Dr. Bobby Rumpf, the infectious disease specialist, who kindly agreed to treat this with IV antibiotics and he would call in the order to the dialysis center and this has been discussed in detail with the patient who will make the appropriate arrangements. The patient will also book an appointment as soon as possible to see Dr. [name] in the office. Of note the patient has not been on antibiotics this entire week as the  dialysis center did not receive any orders from Dr. [name]. I got in touch with Dr. [name] who tells me the patient has an appointment to see him this coming Wednesday. 12/14/2016 -- the patient is on ceftazidime and vancomycin during his dialysis, and I understand this was put on by his nephrologist Dr. Raliegh Ip possibly after speaking with infectious disease Dr. [name] 12/23/16; patient was  on my schedule today for a wound evaluation as he had difficulties with his schedule earlier this week. I note that he is on vancomycin and ceftazidine at dialysis. In spite of this he arrives today with a new wound on the base of the right second toe this easily probes to bone. The known wound at the tip of the second toe With this area. He also has a superficial area on the medial aspect of the third toe on the side of the DIP. This does not appear to have much depth. The area on the plantar left foot is a deep area but did not probe the bone 12/28/16 -- he has brought in some lab work and the most recent labs done showed a hemoglobin of 12.1 hematocrit of 36.3, neutrophils of 51% WBC count of 5.6, BN of 53, albumin of 4.1 globulin of 3.7, vancomycin 13 g per mL. 01/04/2017 -- he saw Dr. [name] who has recommended a amputation of the right second toe and he is awaiting this date. He sees Dr. Bobby Rumpf of infectious disease tomorrow. The bone culture taken on 12/28/2016 had no growth in 2 days. 01/11/2017 -- was seen by Dr. Bobby Rumpf regarding the management and has recommended a eval by vascular surgeons. He recommended to continue the antibiotics during his hemodialysis. Xray of the left foot -- IMPRESSION: No acute fracture, dislocation, or osseous erosion identified. 01/18/2017 -- he has his vascular workup later today and he is going to have his right foot second toe amputation this coming Friday by Dr. [name]. We have also put in for a another 30 treatments with hyperbaric oxygen  therapy 01/25/2017 -- I have reviewed Dr. Donnetta Hutching is vascular report from last week where he reviewed him and thought his vascular function was good enough to heal his amputation site and no further tests were recommended. He also had his orthopedic related to surgery which is still pending the notes and we will review these next week. 02/01/2017 -- the operative remote of Dr. 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 02/08/2017 --he was seen by Dr. [name] on 02/03/2017 -- patient is on [place]. after a thorough review he had recommended to continue with antibiotics during hemodialysis. The patient was seen by Dr. [name], but I do not find any follow-up note on the electronic medical record. he has removed the dressing over the right foot to remove the sutures and asked him to see him back in a month's time 02/22/2017 -- patient has been febrile and has been having symptoms of the upper respiratory tract infection but has not been checked for the flu and it's been over 4 days now. He says he is feeling better today. Some drainage between his left first and second toe and this needed to be looked at 03/08/2017 -- was seen by Dr. Bobby Rumpf on 03/07/2017 after review he will stop his antibiotics and see him back in a month to see if he is feeling well. 03/15/2017 -- he has completed his course of hyperbaric oxygen therapy and is doing fine with his health otherwise. 03/22/2017 -- his nutritionist at the dialysis center has recommended a protein supplement to  help build his collagen and we will prescribe this for him when he has the  details 05/03/2017 -- he recently noticed the area on the plantar aspect of his fifth metatarsal head which opened out and had minimal drainage. 05/10/2017 -- -- x-ray of the left foot -- IMPRESSION:1. No convincing conventional radiographic evidence of active osteomyelitis.2. Active soft tissue ulceration at the tip of the fifth digit, and at the lateral aspect of the foot adjacent the base of the fifth metatarsal. 3. Surgical changes of prior fourth toe amputation. 4. 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 5. Small vessel atherosclerotic vascular calcifications. 6. 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 ======= Readmission after 5 weeks: 06/15/2017 -- the patient returns after 5 weeks having had an MRI done on 05/17/2017 MRI of the left foot without contrast showed soft tissue ulcer overlying the base of the fifth metatarsal. 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 No drainable fluid collection to suggest an abscess. He was in hospital between 05/16/2017 and 05/25/2017 -- and on discharge was asked to follow-up with Dr. [name] and Dr. [name]. He was treated 2 weeks post discharge with vancomycin plus ceftriaxone with hemodialysis and oral metronidazole 500 mg 3 times a day. 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 last hemoglobin A1c was 6.7% He had a postoperative MR of the foot on 05/23/2017 -- which showed no new areas of cortical bone loss, edema or soft tissue ulceration to suggest osteomyelitis. the patient has completed his course of IV antibiotics during dialysis and is to see the infectious disease doctor tomorrow. Dr. [name] had seen him after suture removal and asked him to keep the wound with a dry dressing 06/21/2017 -- he was seen by Dr. Bobby Rumpf of infectious  disease on 06/16/2017 -- he stopped his Flagyl and will see him back in 6 weeks. He was asked to follow-up with Dr. [name] and with the wound clinic clinic. 07/05/17; bone biopsy from last time showed acute osteomyelitis. This is from the reminiscent in the left fifth metatarsal. Culture result is apparently still pending [holding for anaerobe}. From my understanding in this case this is been a progressive necrotic wound which is deteriorated markedly over the last 3 weeks since he returned here. He now has a large area of exposed bone which was biopsied and cultured last week. Dr. Graylon Good has put him on vancomycin and Fortaz during his hemodialysis and Flagyl orally. He is to see Dr. [name] next week 07/19/2017 -- the patient was reviewed by Dr. [name] of orthopedics who reviewed the case in detail and agreed with the plan to continue with IV antibiotics, aggressive wound care and hyperbaric oxygen therapy. He would see him back in 3 weeks' time 08/09/2017 -- saw Dr. Bobby Rumpf on 08/03/2017 -he was restarted on his antibiotics for 6 weeks which included vanco, ceftaz and Flagyl. he recommended continuing his antibiotics for 6 weeks and reevaluate his completion date. He would continue with wound care and hyperbaric oxygen therapy. 08/16/2017 -- I understand he will be completing his 6 weeks of antibiotics sometime later this week. 09/19/2017 -- he has been without his wound VAC for the last week due to lack of supplies. He has been packing his wound with silver alginate. 10/04/2017 -- he has an appointment with Dr. [name] tomorrow and hence  we will not apply the wound VAC after his dressing changes today. 10/11/2017 -- he was seen by Dr. Bobby Rumpf on 10/05/2017, noted that the patient was currently off antibiotics, and after thorough review he recommended he follow-up with the wound care as per plan and no further antibiotics at this point. 11/29/17; patient is arrived for the wound on his  left lateral foot.[name] He is completed IV antibiotics and 2 rounds of hyperbaric oxygen for treatment of underlying osteomyelitis. He arrives today with a surface on most of the wound area however our intake nurse noted drainage from the superior aspect. This was brought to my attention. 12/13/2017 -- the right plantar foot had a large bleb and once the bleb was opened out a large callus and subcutaneous debris was removed and he has a plantar ulcer near the fourth metatarsal head 12/28/17 on evaluation today patient appears to be doing acutely worse in regard to his left foot. The wound which has been appearing to do better as now open up more deeply there is bone palpable at the base of the wound unfortunately. He tells me that this feels "like it did when he had osteomyelitis previously" he also noted that his second toe on the left foot appears to be doing worse and is swollen there does appear to be some fluid collected underneath. His right foot plantar ulcer appears to be doing somewhat better at this point and there really is no complication at the site currently. No fevers, chills, nausea, or vomiting noted at this time. Patient states that he normally has no pain at this site however. T oday he is having significant pain. 01/03/18; culture done last week showed methicillin sensitive staph aureus and group B strep. He was on Septra however he arrives today with a fever of 101. He has a functional dialysis shunt in his left arm but has had no pain here. No cough. He still makes some urine no dysuria he did have abdominal pain nausea and diarrhea over the weekend but is not had any diarrhea since yesterday. Otherwise he has no specific complaints 01/05/18; the patient returns today in follow-up for his presentation from 1/8. At that point he was febrile. I gave him some Levaquin adjusted for his dialysis status. He tells me the fever broke that night and it is not likely that this was actually a wound  infection. He is gone on to have an MRI of the left foot; this showed interval development of an abnormal signal from the base of the third and fourth metatarsal and in the cuboid reminiscent consistent with osteomyelitis. There is no mention of the left second toe I think which was a concern when it was ordered. The patient is taking his Levaquin 01/10/18; the patient has completed his antibiotics today. The area on the left lateral foot is smaller but it still probes easily to bone. He has underlying osteomyelitis here and sees Dr. Megan Salon of infectious disease this week ooThe area on the right third plantar metatarsal head still requires debridement not much change in dimensions [facility] has a new wound on the medial tip of his right third toe again this probes to bone. He only noticed this 2 days ago 01/17/18; the patient saw Dr. [name] last week and he is back on Vanco and Fortaz at dialysis. 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  We are previously seeing him for a wound also on the base of the fourth med head on the right. X-ray of this area did not show osteomyelitis in relation to a new wound on the tip of his right third toe. Again this probes to bone. Finally his second toe on the left which didn't really show anything on the MRI at least no report appears to have a separated cutaneous area which I think is going to come right off the tip of his toe and leave exposed bone. Whether this is infectious or ischemic I am not clear Culture I did from the third toe last week which was his new wound was negative. Plain x-ray on the right foot did not show osteomyelitis in the toes 01/24/18; the patient is going went to see Dr. [name]. He is going to have a amputation of the left second and right third toe although paradoxically the left second toe looks better than last week. We have  treating a deep probing wound on the left lateral foot and the area over the third metatarsal head on the right foot. 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 Also a debridement including bone of the left lateral foot and a closure which is still sutured. This is a bit surprising. Also apparent debridement of the base of the right third toe wound. 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 I'm not sure exactly where we are in the frame of things here. The wound on the left lateral foot is still sutured also the amputation sites of the left second and right third toe. He is been using Xeroform to the sutured areas silver alginate to the right foot 02/14/18; he continues on Rocephin at dialysis for osteomyelitis. I still don't have a good sense of where we are in the treatment duration. The wounds on the left lateral foot had the sutures removed and this clearly still probes deeply. We debrided the area with a #5 curet. We'll use silver alginate to the wound oothe area over the right third met head also required debridement of callus skin and subcutaneous tissue and necrotic debris over the wound surface. This tunnels superiorly but I did not unroofed this today. ooThe areas on the tip of his toes have a crusted surface eschar I did not debridement this either. 02/21/18; he continues on Rocephin at dialysis for osteomyelitis. I don't have a good sense of where we are and the treatment duration. The wounds on the left lateral foot is callused over on presentation but requires debridement. The area on the right third med head plantar aspect actually is measuring smaller 02/28/18;patient is on Vanco and Fortaz at dialysis, I'm not sure why I thought this was Rocephin above  unless it is been changed. I don't have a good sense of time frame here. Using silver collagen to the area on the left lateral foot and right third med head plantar foot 03/07/18; patient is completing bank and Fortaz at dialysis soon. He is using Silver collagen to the area on the left lateral foot and the right third metatarsal head. He has a smaller superficial wound distal to the left lateral foot wound. 03/14/18-he is here in follow-up evaluation for multiple ulcerations to his bilateral feet. He  presents with a new ulceration to the plantar aspect of the left foot underneath the blister, this was deroofed to reveal a partial thickness ulcer. He is voicing no complaints or concerns, tolerated dialysis yesterday. We will continue with same treatment plan and he will follow-up next week 03/21/18; the patient has a area on the lateral left foot which still has a small probing area. The overall surface area of the wound is better. He presented with a new ulceration on the left foot plantar fifth metatarsal head last week. The area on the right third metatarsal head appears smaller.using silver alginate to all wound areas. The patient is changing the dressing himself. He is using a Darco forefoot offloading are on the right and a healing sandal on the left 03/28/18; original wound left lateral foot. Still nowhere close to looking like a heeling surface oosecondary wound on the left lateral foot at the base of his question fifth metatarsal which was new last week ooRight third metatarsal head. ooWe have been using silver alginate all wounds 04/04/18; the original wound on the left lateral foot again heavy callus surrounding thick nonviable subcutaneous tissue all requiring debridement. The new wound from 2 weeks ago just near this at the base of the fifth metatarsal head still looks about the same ooUnfortunately there is been deterioration in the third medical head wound on the right which now probes to  bone. I must say this was a superficial wound at one point in time and I really don't have a good frame of reference year. I'll have to go back to look through records about what we know about the right foot. 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 He is also been to see Dr. [name]. Somewhere in here as somebody is ordered a VASCULAR evaluation which was done on 03/22/18. On the right is anterior tibial artery was monophasic triphasic at the posterior tibial artery. On the left anterior tibial artery monophasic posterior tibial artery monophasic. ABI in the right was 1.43 on the left 1.21. TBIs on the right at 0.41 and on the left at 0.32. A vascular consult was recommended and I think has been arranged. 04/11/18; Mr. Vandunk has not had an MRI of the right foot since 2017. Recent x-ray of the right foot done in January and February was negative. However he has had a major deterioration in the wound over the third met head. He is completed antibiotics last week at dialysis [place]. I think he is going to see vascular in follow-up. The areas on the left lateral foot and left plantar fifth metatarsal head both look satisfactory. I debrided both of these areas although the tissue here looks good. The area on the left lateral foot once probe to bone it certainly does not do that now 04/18/18; MRI of the right foot is on Thursday. He has what looks to be serosanguineous purulent drainage coming out of the wound over the right third metatarsal head today. He completed antibiotics bank and Fortaz 2 weeks ago at dialysis. He has a vascular follow-up with regards to his arterial insufficiency although I don't exactly see when that is booked. The areas on the left foot including the lateral left foot and the plantar left fifth metatarsal head look about the same. 04/25/18-He is here in follow-up evaluation  for bilateral foot ulcers. MRI obtained was negative for osteomyelitis. Wound culture was negative.  We will continue with same treatment plan he'll follow-up next week 05/02/18; the right plantar foot wound over the third metatarsal head actually looks better than when I last saw this. His MRI was negative for osteomyelitis wound culture was negative. ooOn the left plantar foot both wounds on the plantar fifth metatarsal head and on the lateral foot both are covered in a very hard circumferential callus. 05/09/18; right plantar foot wound over the third metatarsal head stable from last week. ooLeft plantar foot wound over the fifth metatarsal head also stable but with callus around both wound areas ooThe area on the left lateral foot had thick callus over a surface and I had some thoughts about leaving this intact however it felt boggy. ooWe've been using silver alginate all wounds 05/16/18; since the patient was last here he was hospitalized from 5/15 through 5/19. 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 This grew MRSA. He was placed on vancomycin.MRI of the left foot suggested "progressive" osteomyelitis and bone destruction of the cuboid and the base of the fifth metatarsal. Stable erosive changes of the base of the second metatarsal. I'm wondering if they're aware that he had surgery and debridement in the area of the underlying bone previously by Dr. [name]. In any case, He was also revascularized with an angioplasty and stenting of the left tibial peroneal trunk and angioplasty of the left posterior tibial artery. He is on vancomycin at dialysis. He has a 2 week follow-up with infectious disease. He has vascular surgery follow-up. He was started on Plavix. 05/23/18; the patient's wound on the lateral left foot at the level of the fifth metatarsal head and the plantar wound on the plantar  fifth metatarsal head both look better. The area on the right third metatarsal head still has depth and undermining. We've been using silver alginate. He is on vancomycin. He has been revascularized on the left 05/30/18; he continues on vancomycin at dialysis. Revascularized on the left. The area on the left lateral foot is just about closed. Unfortunately the area over the plantar fifth metatarsal head undermining superiorly as does the area over the right third metatarsal head. Both of these significantly deteriorated from last week. He has appointments next week with infectious disease and orthopedics I delayed putting on a cast on the left until those appointments which therefore we made we'll bring him in on Friday the 14th with the idea of a cast on the left foot 06/06/18; he continues on vancomycin at dialysis. Revascularized on the left. He arrives today with a ballotable swelling just above the area on the left lateral foot where his previous wound was. He has no pain but he is reasonably insensate. The difficult areas on the plantar left fifth metatarsal head looks stable whereas the area on the right third plantar metatarsal head about the same as last week there is undermining here although I did not unroofed this today. He required a considerable debridement of the swelling on the left lateral foot area and I unroofed an abscess with a copious amount of brown purulent material which I obtained for culture. I don't believe during his recent hospitalization he had any further imaging although I need to review this. Previous cultures from this area done in this clinic showed MRSA, I would be surprised if this is not what this is currently even though he is on vancomycin. 06/13/18;  he continues on vancomycin at dialysis however he finishes this on Sunday and then graduates the doxycycline previously prescribed by Dr. [name]. The abscess site on the lateral foot that I unroofed last time grew  moderate amounts of methicillin-resistant staph aureus that is both vancomycin and tetracycline sensitive so we should be okay from that regard. He arrives today in clinic with a connection between the abscess site and the area on the lateral foot. We've been using silver alginate to all his wound areas. The right third metatarsal head wound is measuring smaller. Using silver alginate on both wound areas 06/20/18; transitioning to doxycycline prescribed by Dr. [name]. The abscess site on the lateral foot that I unroofed 2 weeks ago grew MRSA. That area has largely closed down although the lateral part of his wound on the fifth metatarsal head still probes to bone. I suspect all of this was connected. ooOn the right third metatarsal head smaller looking wound but surrounded by nonviable tissue that once again requires debridement using silver alginate on both areas 06/27/18; on doxycycline 100 twice a day. The abscess on the lateral foot has closed down. Although he still has the wound on the left fifth metatarsal head that extends towards the lateral part of the foot. The most lateral part of this wound probes to bone ooRight third metatarsal head still a deep probing wound with undermining. Nevertheless I elected not to debridement this this week ooIf everything is copious static next week on the left I'm going to attempt a total contact cast 07/04/18; he is tolerating his doxycycline. The abscess on the left lateral foot is closed down although there is still a deep wound here that probes to bone. We will use silver collagen under the total contact cast Silver collagen to the deep wound over the right third metatarsal head is well 07/11/18; he is tolerating doxycycline. The left lateral fifth plantar metatarsal head still probes deeply but I could not probe any bone. We've been using silver collagen and this will be the second week under the total contact cast ooSilver collagen to deep wound over  the right third metatarsal head as well 07/18/18; he is still taking doxycycline. The left lateral fifth plantar metatarsal this week probes deeply with a large amount of exposed bone. Quite a deterioration. He required extensive debridement. Specimens of bone for pathology and CNS obtained ooAlso considerable debridement on the right third metatarsal head 07/25/18; bone for pathology last week from the lateral left foot showed osteomyelitis. CandS of the bone Enterobacter. And Klebsiella. He is now on ciprofloxacin and the doxycycline is stopped [Dr. [name] of infectious disease] area He has an appointment with Dr. [name] tomorrow I'm going to leave the total contact cast off for this week. May wish to try to reapply that next week or the week after depending on the wound bed looks. I'm not sure if there is an operative option here, previously is followed with Dr. [name] 08/01/18 on evaluation today patient presents for reevaluation. He has been seen by infectious disease, Dr. [name], and he has placed the patient back on Ceftaz currently to be given to him at dialysis three times a week. 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 Fortunately there does not appear to be the evidence of infection at this point in time. Overall the patient has been tolerating the dressing changes without complication. We are no longer due lives in the  cast of left foot secondary to the infection obviously. 08/10/18 on evaluation today patient appears to be doing rather well all things considered in regard to his left plantar foot ulcer. He does have a significant callous on the lateral portion of his left foot he wonders if I can help clean that away to some degree today. Fortunately he is having no evidence of infection at this time which is good news. No fevers chills noted. With that being said his right plantar foot actually does have a  significant callous buildup around the wound opening this seems to not be doing as well as I would like at this point. I do believe that he would benefit from sharp debridement at this site. Subsequently I think a total contact cast would be helpful for the right foot as well. 08/17/18; the patient arrived today with a total contact cast on the right foot. This actually looks quite good. He had gone to see Dr. Megan Salon of infectious disease about the osteomyelitis on the left foot. I think he is on IV Fortaz at dialysis although I am not exactly sure of the rationale for the Fortaz at the time of this dictation. He also arrived in today with a swelling on the lateral left foot this is the site of his original wounds in fact when I first saw this man when he was under Dr. Ardeen Garland care I think this was the site of where the wound was located. It was not particularly tender however using a small scalpel I opened it to [place] some moderate amount of purulent drainage. 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 Last MRI was in May that showed progressive osteomyelitis and bone destruction of the cuboid and base of the fifth metatarsal. Last week he had an abscess over this same area this was removed. Culture was negative. ooWe are continuing with a total contact cast to the area on the third metatarsal head on the right and making some good progress here. The patient asked me again about renal transplant. He is not on the list because of the open wounds. 08/31/18; apparently the patient had an interruption in the [place] but he is now back on this at dialysis. Apparently there was a misunderstanding and Dr. Crissie Figures orders. Due to have the MRI of the left foot tonight. ooThe area on the right foot continues to have callus thick skin and subcutaneous tissue around the wound edge that  requires constant debridement however the wound is smaller we are using a total contact cast in this area. Alginate all wounds 09/07/18; without much surprise the MRI of his left foot showed osteomyelitis in the reminiscent of his fourth metatarsal but also the third metatarsal. She arrives in clinic today with the right foot and a total contact cast. There was purulent drainage coming out of the wound which I have cultured. Marked deterioration here with undermining widely around the wound orifice. Using pickups and a scalpel I remove callus and subcutaneous tissue from a substantial new opening. ooIn a similar fashion the area on the left lateral foot that was blistered last week I have opened this and remove skin and subcutaneous tissue from this area to expose a obvious new wound. I think there is extension and communication between all of this on the left foot. ooThe patient is on Fortaz at dialysis. Culture done of  the right foot. We are clearly not making progress here. ooWe have made him an appointment with Dr. [name] of orthopedics. I think an amputation of the left leg may be discussed. I don't think there is anything that can be done with foot salvage. 09/14/18; culture from the right foot last time showed a very resistant MRSA. This is resistant to doxycycline. I'm going to try to get linezolid at least for a week. He does not have a appointment with Dr. [name] yet. 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 I'll send a message to Dr. [name] of infectious disease. I want the patient to see Dr. [name] to go over the pros and cons of an amputation which I think will be a BKA. The patient had a question about whether this is curative or not. I told him that I thought it would be although spread of staph aureus infection is not unheard of. MRI of the right foot was done at the end of April 2019 did not show osteomyelitis. The  patient last saw Dr. [name] on 9/3. I'll send Dr. [name] a message. I would really like him to weigh the pros and cons of a BKA on the left otherwise he'll probably need IV antibiotics and perhaps hyperbaric oxygen again. He has not had a good response to this in the past. His MRI earlier this month showed progressive damage in the remnants of the fourth and third metatarsal 09/21/2018; sees Dr. [name] of orthopedics next week to discuss a left BKA in response to the osteomyelitis in the left foot. Using silver alginate to all wound areas. He is completing the Zyvox I did put in for him after last culture showed MRSA 09/29/2018; patient saw Dr. [name] of orthopedics discussed the osteomyelitis in the left foot third and fourth metatarsals. He did not recommend urgent surgery but certainly stated the only surgical option would be a BKA. He is communicating with Dr. [name]. The problem here is the instability of the areas on his foot which constantly generate draining abscesses. I will communicate with Dr. [name] about this. He has an appointment on 10/23 10/05/2018; patient sees Dr. [name] on 10/23. I have sent him a secure message not ordered any additional antibiotics for now. Patient continues to have 2 open areas on the left plantar foot at the fifth metatarsal head and the lateral aspect of the foot. These have not changed all that much. The area on the right third met head still has thick callus and surrounding subcutaneous tissue we have been using silver alginate 10/12/2018; patient sees Dr. [name] or colleague on 10/23. 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 The area on the right plantar foot is just about closed. The superior wound on the left fifth metatarsal head is callused over but I am not sure if this is closed. The area below it is about the same. Considerable amount of callus on the lateral foot. We have been using  silver alginate to the wounds 10/19/2018; the patient saw Dr. [name] on 10/22. He wishes to try and continue to save the left foot. He has been given 6 weeks of oral ciprofloxacin. We continue to put a total contact cast on the right foot. The area over the fifth metatarsal head on the left is callused over/perhaps healed but I did not remove the callus to find out. He  still has the wound on the left lateral midfoot requiring debridement. We have been using silver alginate to all wound areas 10/26/2018 ooOn the left foot our intake nurse noted some purulent drainage from the inferior wound [currently the only one that is not callused over] ooOn the right foot even though he is in total contact cast a considerable amount of thick black callus and surface eschar. On this side we have been using silver alginate under a total contact cast. oohe remains on ciprofloxacin as prescribed by Dr. [name] 11/02/2018; culture from last week which is done of the probing area on the left midfoot wound showed MRSA "few". I am going to need to contact Dr. [name] which I will do today I think he is going to need IV antibiotics again. The area on the left foot which was callused on the side is clearly separating today all of this was removed a copious amounts of callus and necrotic subcutaneous tissue. ooThe area on the right plantar foot actually remained healthy looking with a healthy granulated base. ooWe are using silver alginate to all wound areas 11/09/2018; unfortunately neither 1 of the patient's wounds areas looks at all satisfactory. On the left he has considerable necrotic debris from the plantar wound laterally over the foot. I removed copious amounts of material including callus skin and subcutaneous tissue. On the right foot he arrives with undermining and frankly purulent drainage. Specimen obtained for culture and debridement of the callused skin and subcutaneous tissue from around  the circumference. Because of this I cannot put him back in a total contact cast but to be truthful we are really unfortunately not making a lot of progress I did put in a secure text message to Dr. [name] wondering about the MRSA on the left foot. He suggested vancomycin although this is not started. I was left wondering if he expected me to send this into dialysis. Has we have more purulence on the right side wait for that result before calling dialysis 11/16/2018; I called dialysis earlier this week to get the vancomycin started. I think he got the first dose on Tuesday. The patient tells me that he fell earlier this week twisting his left foot and ankle and he is swelling. He continues to not look well. He had blood cultures done at dialysis on Tuesday he is not been informed of the results. 12/07/2018; the patient was admitted to hospital from 11/22/2018 through 12/02/2018. He underwent a left BKA. Apparently had staph sepsis. In the meantime being off the right foot this is closed over. He follows up with Dr. 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 Subsequent to this he actually had multiple falls in fact he tells me to following which calls the wound to be his apparently. He has been seeing Dr. [name] and his physician assistant in the interim. They actually did want him to come back for reevaluation to see if there's anything we can do for me wound care perspective the help this area to heal more appropriately. The patient states that he does not have a tremendous amount of pain which is good news. No fevers, chills, nausea, or vomiting noted at this time. We have gotten approval from Dr. [name] office had [place] for [place] to treat the patient for his stop wound. This is due to the fact that the patient was in the 90 day postop global.  1/21; the patient does not have  an open wound on the right foot although he does have some pressure areas that will need to be padded when he is transferring. The dehisced surgical wound looks clean although there is some undermining of 1.5 cm superiorly. 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 This appears healthy but there is undermining. TheraSkin #1 2/11; TheraSkin #2 2/25; TheraSkin #3. Wound is measuring smaller 3/10; TheraSkin #4 wound is measuring smaller 3/24; TheraSkin #5 wound is measuring smaller and looks healthy. 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 He has no open area on the right foot although he does have some callused area over the original third metatarsal head wound. The third metatarsal is also subluxed on the right foot. I have warned him today that he cannot consider wearing a prosthesis for at least a month but he can go for measurements. He is going to need to keep the right foot padded is much as possible in his diabetic shoe indefinitely READMISSION 06/12/2019 Mr. Nish is a type II diabetic on dialysis. He has been in this clinic multiple times with wounds on his bilateral lower extremities. 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 He also has a history of wounds on his right foot but tells me that everything is going well here. He actually came in here with his prosthesis walking with a cane. We were all really quite gratified to see this He states over the last several weeks he has had a painful area at the tip of the left third finger. His dialysis shunt in his is in the left upper arm and this particularly hurts during dialysis when his fingers get numb and there is a lot of pain in the tip of his left third finger. I believe he is seen vascular surgery. He had a Doppler done to evaluate  for a dialysis steal syndrome. He is going for a banding procedure 1 week from today towards the left AV fistula. I think if that is unsuccessful they will be looking at creating a new shunt. He had a course of doxycycline when he which he finished about a week ago. He has been using Bactroban to the finger 6/25; the patient had his banding procedure and things seem to be going better. He is not having pain in his hand at dialysis. The area on the tip of his finger seems about closed. He did however have a paronychia I the last time I saw him. I have been advising him to use topical Bactroban washing the finger. Culture I did last time showed a few staph epidermidis which I was willing to dismiss as superficial skin contaminant. He arrives today with the finger wound looking better however the paronychia a seems worse 7/9; 2-week follow-up. He does not have an open wound on the tip of his third left finger. I thought he had an initial ischemic wound on the tip of the finger as well as a paronychia a medially. All of this appears to be closed. 8/6-Patient returns after 3 weeks for follow-up and has a right second met head plantar callus with a significant area of maceration hiding an ulcer ABI repeated today on right - 1.26 8/13-Patient returns at 1 week, the right  second met head plantar wound appears to be slightly better, it is surrounded by the callus area we are using silver alginate 8/20; the patient came back to clinic 2 weeks ago with a wound on the right second metatarsal head. He apparently told me he developed callus in this area but also went away from his custom shoes to a pair of running shoes. Using silver alginate. 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 There might be much we can do to offload this although the patient tells me he has a wheelchair that he is using to try and stay off the wound is much as possible 8/27; right second metatarsal head. Still small  punched out area with thick callus and subcutaneous tissue around the wound. We have been using silver collagen. This is always been an issue with this man's wound especially on this foot 9/3; right second met head. Still the same punched-out area with thick callus and subcutaneous tissue around the wound removing the circumference demonstrates repetitively undermining area. I have removed all the subcutaneous tissue associated with this. We have been using silver collagen 9/15; right second metatarsal head. Same punched-out thick callus and subcutaneous tissue around the wound area. Still requiring debridement we have been using silver collagen I changed back to silver alginate X-ray last time showed no evidence of osteomyelitis. Culture showed Klebsiella and he was started and Keflex apparently just 4 days ago 9/24; right second metatarsal head. He is completed the antibiotics I gave him for 10 days. Same punched-out thick callus and subcutaneous tissue around the area. Still requiring debridement. We have been using silver alginate. The area itself looks swollen and this could be just [place] that comes on this area with walking on a prosthesis on the left however I think he needs an MRI and I am going to order that today. There is not an option to offload this further 10/1; right second plantar metatarsal head. MRI booked for October 6. Generally looking better today using silver alginate 10/8; right second plantar metatarsal head. MRI that was done on 10/6 was negative for osteomyelitis noted prior amputations of the second and fourth toes at the [facility]. Prior amputation of the third toe to the base of the middle phalanx. Prior amputation of the fifth toe to the head of the metatarsal. 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 He does not recall about hearing about this. He did not have any pain but then again he has reduced sensation from diabetic  neuropathy 10/15; right second plantar metatarsal head. Still in the same condition callus nonviable subcutaneous tissue which cleans up nicely with debridement but reforms by the next week. The patient tells me that he is offloading this is much as he can including taking his wheelchair to dialysis. We have been using silver alginate, changed to silver collagen today 10/22; right second plantar metatarsal head. Wound measures smaller but still thick callus around this wound. Still requiring debridement 11/5 right second plantar metatarsal head. Less callus around the wound circumference but still requiring debridement. There is still some undermining which I cleaned out the wound looks healthy but still considerable punched out depth with a relatively small circumference to the wound there is no palpable bone no purulent drainage 11/12; right second plantar metatarsal head. Small wound with thick callus tissue around the wound and significant undermining. We have been using silver collagen after my continuous debridements  of this area 11/19; patient arrives in today with purulent drainage coming out of the wound in the second third met head area on the right foot. Serosanguineous thick drainage. Specimen obtained for culture. 11/21/2019 on evaluation today patient appears to be doing well with regard to his foot ulcer. The good news is is culture came back negative for any bacteria there was no growth. The other good news is his x-ray also appears to be doing well. The foot is also measuring much better than what it was. Overall I am very pleased with how things seem to be progressing. 12/22; patient was in [facility] for several weeks due to the death of a family member. He said he stayed off the wound using silver alginate. 01/03/2020. Patient has a small open area with roughly 0.3 cm of circumferential undermining. The orifice looks smaller. We have been using silver collagen. There is not  a wound more aggressive way to offload this area as he has a prosthesis on the other leg 1/21; again the same small area is 2 weeks ago. We have been using silver collagen I change that to endoform today I really believe that this is an offloading issue 2/4; the area on the right third met head. We have been using endoform. 2/11 the area on the right third met head we have been using silver alginate. 2/25; the area on the right third met head. There is much less callus on this today. The patient states he is offloading this even more aggressively. As an example he is taking his wheelchair into dialysis on most days 3/4; right third metatarsal head. Again he has eschar over the surface of the wound which I removed with a #3 curette. Some subcutaneous debris. This has 0.8 cm of direct probing depth. I cannot feel any bone here. I did do a culture the area 3/11; right third metatarsal head. Again an eschared area over the wound which almost makes this look superficial. Again debridement reveals a wound with probing depth probably not much different from last week there is no palpable bone no palpable purulence. The patient tells me that he is making every effort to offload this area properly. He is taking wheelchair into dialysis etc. There is no option for forefoot offloading or a total contact cast. We used Oasis #1 today 3/18; again callus and eschar over the wound surface. I remove this but still a probing wound although only 3 mm in depth this time. This is an improvement 3/25; again callus and eschar over the wound surface which I removed the wound is much deeper today at 0.7 cm. There is no palpable bone. Because of the appearance of this a culture was done. I am not going to put the Oasis back in this. Concerned about underlying infection. Notable for the fact that he is just finished doxycycline for the last go round of this 3/30; still 0.7 cm in depth which is disappointing. Culture I gave  last time showed a few Staph aureus which is methicillin susceptible. This should have been taken care of by the recent course of doxycycline I gave him. We are using silver alginate 4/6; almost 1 cm in depth. We have been using silver alginate with underlying Bactroban. 4/15; about 1 cm in depth still. There is no palpable bone but there is undermining thick surface again as usual. Our intake nurse noted what looked to be purulent drainage I suppose that could have been Bactroban but I did a  culture for CandS 4/20; depth down 0.7 cm. Culture from last time was negative we have been using Bactroban and silver alginate 4/29; depth down 0.5 cm. Using silver alginate. He reports some drainage 5/6; comes in with the wound worse. Wound is deeper and tunnels. Measures 0.9 depth and an undermining area of 1.8 cm. 5/18; there is a change the dressing last week. Still using Sorbact hydrogel. What I can see of the base of the wound looks better. 6/1. Not much change. I changed him to endoform today. He has 1.1 cm in depth no palpable bone 6/8; again a small punched out wound with significant undermining. Culture I did last week was negative. Today he had some purulent drainage perhaps mixed with some retained endoform. 6/22; MRI report below ooIMPRESSION: Skin ulceration on the plantar surface of the foot at the level of the head of the second metatarsal. Mild marrow edema in the head of the second metatarsal could be reactive or due to osteomyelitis. Thin fluid collection surrounding the head and neck of the second metatarsal appears to communicate with the patient's skin wound and is worrisome for a septic collection. Advanced first MTP osteoarthritis. Prior amputations as noted above. Electronically Signed By: Inge Rise M.D. On: 06/16/2020 11:51 PCR culture I did last time I believe showed peptostreptococcus. There was no comment on any other gram-negative or gram-positive organisms. I gave  him a course of Flagyl which I think he is finishing up. We are not making any progress with the wound over the right second/third metatarsal head. We are using silver alginate putting him in kerlix Coban 6/29; has his appointment with Dr. [name] of orthopedics next Tuesday I think we see him on the same day. He was in for a nurse visit today. Nurse noted undermining of skin and callus so I was asked to look at this. We have been using silver alginate and kerlix Coban 7/6; patient saw Dr. [name] PA today will see Dr. [name] on Tuesday. The major question is does he needs surgery to clean out the area in question culture question resect any involved bone etc. Otherwise he will need to see infectious disease for broad-spectrum antibiotics given at dialysis 7/13; I talked to Dr. [name] late last week. He was not impressed with the MRI results thinking that the bone changes may have been reactive rather than osteomyelitis. He did not think he needed drainage of an abscess thinking of the fluid we are seeing was superficial and contiguous with the actual wound. He did not think he needed surgery or resection. Recommended consideration of infectious disease and return him to our care. We have been using silver alginate 7/20; PCR culture I did last week showed staph aureus methicillin sensitive. I have him on twice daily Keflex. Silver alginate to the wound 7/27; I have continued his 500 every 12 of cephalexin with a second dose after dialysis. My intention is to give him 4 to 6 weeks of antibiotics directed at [facility] 07/31/2020 on evaluation today patient appears to be doing a little better in regard to his overall wound size. This is good news. He does have a fairly significant callus buildup still and I am can work on that today. Fortunately there is no signs of active infection at this time. He has been on Keflex and has an extension of that that he is going to pick up and continue taking. 08/07/20-Patient  back at 1 week with right met head wound looking about the same, overall  the callus removal has helped 8/19; right third met head. Again extensive callus removal with a 10 scalpel. There is a small remaining wound we have been using silver alginate Patient also comes in having a fall and an abrasion on his left elbow just distally. This is a clean wound and should heal with simple measures 8/26; right third met head. This has a surface on this with some callus however this is as good as I have seen this looking quite some time. ooHe had a fall last week with abrasion in his right forearm this is healed Objective Constitutional Sitting or standing Blood Pressure is within target range for patient.. Pulse regular and within target range for patient.[name] Respirations regular, non-labored and within target range.. Temperature is normal and within the target range for the patient.[name] Appears in no distress. Vitals Time Taken: 2:43 PM, Height: 73 in, Weight: 202 lbs, BMI: 26.6, Temperature: 98.5 F, Pulse: 79 bpm, Respiratory Rate: 18 breaths/min, Blood Pressure: 113/71 mmHg. General Notes: Wound exam; right plantar third metatarsal head. Thick callus over the wound area that I removed last week there is much less callus. There is a very superficial level of callus over where I think the small wound was. This looks satisfactory at this point and I was not going to aggressively debride this today. Integumentary (Hair, Skin) Wound #41 status is Healed - Epithelialized. Original cause of wound was Gradually Appeared. The wound is located on the Right Metatarsal head second. The wound measures 0cm length x 0cm width x 0cm depth; 0cm^2 area and 0cm^3 volume. There is no tunneling or undermining noted. There is a none present amount of drainage noted. The wound margin is thickened. There is no granulation within the wound bed. There is no necrotic tissue within the wound bed. Wound #42 status is Healed -  Epithelialized. Original cause of wound was Trauma. The wound is located on the Left Forearm. The wound measures 0cm length x 0cm width x 0cm depth; 0cm^2 area and 0cm^3 volume. There is no tunneling or undermining noted. There is a none present amount of drainage noted. There is no granulation within the wound bed. There is no necrotic tissue within the wound bed. 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 Skin Barriers/Peri-Wound Care: Moisturizing lotion - leg and foot daily Primary Wound Dressing: Foam - keep bottom of right foot padded with foam for protection Edema Control: Avoid standing for long periods of time Elevate legs to the level of the heart or above for 30 minutes daily and/or when sitting, a frequency of: - throughout the day 1. I left the area over the right third metatarsal head intact. I told him that he needs to pad this area and his diabetic foot wear with custom shoes 2. I am not going to discharge him at this point I will bring him back in 2 weeks and see how this is 3. He asked me about walking, usual activities I told him to go with his usual activities to see if this area will hold in a closed state Electronic Signature(s) Signed: 08/21/2020 6:08:30 PM By: Linton Ham MD Entered By: Linton Ham on 08/21/2020 15:32:24 -------------------------------------------------------------------------------- SuperBill Details Patient Name: Date of Service: Falcon, MICHA EL B.  08/21/2020 Medical Record Number: [id number] Patient Account Number: [id number] Date of Birth/Sex: Treating RN: Mar 29, 1951 (69 y.o. [name] Primary Care Provider:  [name] Other Clinician: Referring Provider: Treating Provider/Extender: Tanna Savoy in Treatment: 69 Diagnosis Coding ICD-10 Codes Code Description E11.621 Type 2 diabetes mellitus with foot ulcer L97.512 Non-pressure chronic ulcer of other part of right foot with fat layer exposed I70.298 Other atherosclerosis of native arteries of extremities, other extremity L03.115 Cellulitis of right lower limb S50.312D Abrasion of left elbow, subsequent encounter Facility Procedures The patient participates with Medicare or their insurance follows the Medicare Facility Guidelines: CPT4 Code Description Modifier Quantity [id number] [place] VISIT-LEV 3 EST PT 1 Physician Procedures : CPT4 Code Description Modifier [id number] - WC PHYS LEVEL 3 - EST PT ICD-10 Diagnosis Description E11.621 Type 2 diabetes mellitus with foot ulcer L97.512 Non-pressure chronic ulcer of other part of right foot with fat layer exposed Quantity: 1 Electronic Signature(s) Signed: 08/21/2020 4:47:33 PM By: Levan Hurst RN, BSN Signed: 08/21/2020 6:08:30 PM By: Linton Ham MD Entered By: Levan Hurst on 08/21/2020 16:05:32

## 2020-08-23 NOTE — Progress Notes (Signed)
FREMONT, SKALICKY ([id number]) Visit Report for 08/14/2020 Arrival Information Details Patient Name: Date of Service: [name], [name] 08/14/2020 3:00 PM Medical Record Number: [id number] Patient Account Number: [id number] Date of Birth/Sex: Treating RN: April 11, 1951 (68 y.o. [name]) Carlene Coria Primary Care Landree Fernholz: [name] Other Clinician: Referring Stasha Naraine: Treating Aundrea Higginbotham/Extender: Tanna Savoy in Treatment: 53 Visit Information History Since Last Visit All ordered tests and consults were completed: No Patient Arrived: Wheel Chair Added or deleted any medications: No Arrival Time: 15:20 Any new allergies or adverse reactions: No Accompanied By: self Had a fall or experienced change in No Transfer Assistance: None activities of daily living that may affect Patient Identification Verified: Yes risk of falls: Secondary Verification Process Completed: Yes Signs or symptoms of abuse/neglect since last visito No Patient Requires Transmission-Based Precautions: No Hospitalized since last visit: No Patient Has Alerts: No Implantable device outside of the clinic excluding No cellular tissue based products placed in the center since last visit: Has Dressing in Place as Prescribed: Yes Pain Present Now: No Electronic Signature(s) Signed: 08/22/2020 5:50:16 PM By: Carlene Coria RN Entered By: Carlene Coria on 08/14/2020 15:20:33 -------------------------------------------------------------------------------- Encounter Discharge Information Details Patient Name: Date of Service: [name] EL B. 08/14/2020 3:00 PM Medical Record Number: [id number] Patient Account Number: [id number] Date of Birth/Sex: Treating RN: Feb 02, 1951 (69 y.o. [name] Primary Care Woodford Strege: [name] Other Clinician: Referring Fadil Macmaster: Treating Lajean Boese/Extender: Tanna Savoy in Treatment: 27 Encounter Discharge Information Items Post  Procedure Vitals Discharge Condition: Stable Temperature (F): 98.1 Ambulatory Status: Wheelchair Pulse (bpm): 80 Discharge Destination: Home Respiratory Rate (breaths/min): 18 Transportation: Private Auto Blood Pressure (mmHg): 116/70 Accompanied By: self Schedule Follow-up Appointment: Yes Clinical Summary of Care: Patient Declined Electronic Signature(s) Signed: 08/14/2020 5:37:54 PM By: Baruch Gouty RN, BSN Entered By: Baruch Gouty on 08/14/2020 17:36:38 -------------------------------------------------------------------------------- Lower Extremity Assessment Details Patient Name: Date of Service: [name], [name] 08/14/2020 3:00 PM Medical Record Number: [id number] Patient Account Number: [id number] Date of Birth/Sex: Treating RN: [birth year] (68 y.o. [name] Primary Care Presten Joost: [name] Other Clinician: Referring Kalley Nicholl: Treating Ruddy Swire/Extender: Tanna Savoy in Treatment: 61 Edema Assessment Assessed: [name]: No] Patrice Paradise: No] Edema: [Left: N] [Right: o] Calf Left: Right: Point of Measurement: cm From Medial Instep cm 30 cm Ankle Left: Right: Point of Measurement: cm From Medial Instep cm 19.5 cm Electronic Signature(s) Signed: 08/22/2020 5:50:16 PM By: Carlene Coria RN Entered By: Carlene Coria on 08/14/2020 15:28:10 -------------------------------------------------------------------------------- Multi Wound Chart Details Patient Name: Date of Service: [name] EL B. 08/14/2020 3:00 PM Medical Record Number: [id number] Patient Account Number: [id number] Date of Birth/Sex: Treating RN: 1951/12/05 (69 y.o. [name] Primary Care Shalese Strahan: [name] Other Clinician: Referring Kalem Rockwell: Treating Dylynn Ketner/Extender: Tanna Savoy in Treatment: 61 Vital Signs Height(in): 9 Pulse(bpm): 78 Weight(lbs): 22 Blood Pressure(mmHg): 116/70 Body Mass Index(BMI): 27 Temperature(F):  98.1 Respiratory Rate(breaths/min): 18 Photos: [41:No Photos Right Metatarsal head second] [42:No Photos Left Elbow] [N/A:N/A N/A] Wound Location: [41:Gradually Appeared] [42:Trauma] [N/A:N/A] Wounding Event: [41:Diabetic Wound/Ulcer of the Lower] [42:Trauma, Other] [N/A:N/A] Primary Etiology: [41:Extremity Cataracts, Anemia, Arrhythmia,] [42:Cataracts, Anemia, Arrhythmia,] [N/A:N/A] Comorbid History: [41:Congestive Heart Failure, Hypertension, Peripheral Arterial Disease, Type II Diabetes, End Stage Renal Disease, History of Burn, Osteoarthritis, Osteomyelitis, Neuropathy 07/23/2019] [42:Congestive Heart Failure, Hypertension,  Peripheral Arterial Disease, Type II Diabetes, End Stage Renal Disease, History of Burn, Osteoarthritis, Osteomyelitis, Neuropathy 08/12/2020] [N/A:N/A] Date Acquired: [41:54] [42:0] [N/A:N/A] Weeks of Treatment: [41:Open] [42:Open] [N/A:N/A]  Wound Status: [41:0.2x0.2x0.3] [42:2.5x1.1x0.1] [N/A:N/A] Measurements L x W x D (cm) [41:0.031] [42:2.16] [N/A:N/A] A (cm) : rea [41:0.009] [42:0.216] [N/A:N/A] Volume (cm) : [41:98.20%] [42:N/A] [N/A:N/A] % Reduction in Area: [41:98.30%] [42:N/A] [N/A:N/A] % Reduction in Volume: [41:Grade 2] [42:Full Thickness Without Exposed] [N/A:N/A] Classification: [41:Small] [42:Support Structures Medium] [N/A:N/A] Exudate A mount: [41:Serosanguineous] [42:Serosanguineous] [N/A:N/A] Exudate Type: [41:red, brown] [42:red, brown] [N/A:N/A] Exudate Color: [41:Thickened] [42:N/A] [N/A:N/A] Wound Margin: [41:Large (67-100%)] [42:Large (67-100%)] [N/A:N/A] Granulation A mount: [41:Red, Pink] [42:Red] [N/A:N/A] Granulation Quality: [41:Small (1-33%)] [42:N/A] [N/A:N/A] Necrotic A mount: [41:Fat Layer (Subcutaneous Tissue): Yes Fat Layer (Subcutaneous Tissue): Yes N/A] Exposed Structures: [41:Fascia: No Tendon: No Muscle: No Joint: No Bone: No None] [42:Fascia: No Tendon: No Muscle: No Joint: No Bone: No None] [N/A:N/A] Epithelialization:  [41:Debridement - Selective/Open Wound N/A] [N/A:N/A] Debridement: Pre-procedure Verification/Time Out 16:10 [42:N/A] [N/A:N/A] Taken: [41:Callus] [42:N/A] [N/A:N/A] Tissue Debrided: [41:Non-Viable Tissue] [42:N/A] [N/A:N/A] Level: [41:1] [42:N/A] [N/A:N/A] Debridement A (sq cm): [41:rea Blade] [42:N/A] [N/A:N/A] Instrument: [41:Minimum] [42:N/A] [N/A:N/A] Bleeding: [41:Pressure] [42:N/A] [N/A:N/A] Hemostasis A chieved: [41:0] [42:N/A] [N/A:N/A] Procedural Pain: [41:0] [42:N/A] [N/A:N/A] Post Procedural Pain: [41:Procedure was tolerated well] [42:N/A] [N/A:N/A] Debridement Treatment Response: [41:0.2x0.2x0.3] [42:N/A] [N/A:N/A] Post Debridement Measurements L x W x D (cm) [41:0.009] [42:N/A] [N/A:N/A] Post Debridement Volume: (cm) [41:Debridement] [42:N/A] [N/A:N/A] Treatment Notes Electronic Signature(s) Signed: 08/14/2020 5:36:57 PM By: Levan Hurst RN, BSN Signed: 08/14/2020 5:57:44 PM By: Linton Ham MD Entered By: Linton Ham on 08/14/2020 17:18:57 -------------------------------------------------------------------------------- Multi-Disciplinary Care Plan Details Patient Name: Date of Service: [name] EL B. 08/14/2020 3:00 PM Medical Record Number: [id number] Patient Account Number: [id number] Date of Birth/Sex: Treating RN: 10-11-1951 (69 y.o. [name] Primary Care Sheldon Amara: [name] Other Clinician: Referring Tiffanni Scarfo: Treating Hodari Chuba/Extender: Tanna Savoy in Treatment: 61 Active Inactive Wound/Skin Impairment Nursing Diagnoses: Knowledge deficit related to ulceration/compromised skin integrity Goals: Patient/caregiver will verbalize understanding of skin care regimen Date Initiated: 06/12/2019 Target Resolution Date: 08/22/2020 Goal Status: Active Ulcer/skin breakdown will have a volume reduction of 30% by week 4 Date Initiated: 06/12/2019 Date Inactivated: 07/05/2019 Target Resolution Date: 07/13/2019 Goal  Status: Met Interventions: Assess patient/caregiver ability to obtain necessary supplies Assess patient/caregiver ability to perform ulcer/skin care regimen upon admission and as needed Assess ulceration(s) every visit Notes: Electronic Signature(s) Signed: 08/14/2020 5:36:57 PM By: Levan Hurst RN, BSN Entered By: Levan Hurst on 08/14/2020 16:04:10 -------------------------------------------------------------------------------- Pain Assessment Details Patient Name: Date of Service: [name] Shines B. 08/14/2020 3:00 PM Medical Record Number: [id number] Patient Account Number: [id number] Date of Birth/Sex: Treating RN: [birth year] (68 y.o. [name] Primary Care Norlene Lanes: [name] Other Clinician: Referring Davari Lopes: Treating Adrain Nesbit/Extender: Tanna Savoy in Treatment: 61 Active Problems Location of Pain Severity and Description of Pain Patient Has Paino No Site Locations Pain Management and Medication Current Pain Management: Electronic Signature(s) Signed: 08/22/2020 5:50:16 PM By: Carlene Coria RN Entered By: Carlene Coria on 08/14/2020 15:21:44 -------------------------------------------------------------------------------- Patient/Caregiver Education Details Patient Name: Date of Service: [name] 8/19/2021andnbsp3:00 PM Medical Record Number: [id number] Patient Account Number: [id number] Date of Birth/Gender: Treating RN: [birth year] (69 y.o. [name] Primary Care Physician: [name] Other Clinician: Referring Physician: Treating Physician/Extender: Tanna Savoy in Treatment: 89 Education Assessment Education Provided To: Patient Education Topics Provided Wound/Skin Impairment: Methods: Explain/Verbal Responses: State content correctly [employer]) Signed: 08/14/2020 5:36:57 PM By: Levan Hurst RN, BSN Entered By: Levan Hurst on 08/14/2020  16:05:14 -------------------------------------------------------------------------------- Wound Assessment Details Patient Name: Date of Service: [name], [name]  08/14/2020 3:00 PM Medical Record Number: [id number] Patient Account Number: [id number] Date of Birth/Sex: Treating RN: 1951-03-01 (68 y.o. [name]) Carlene Coria Primary Care Kaytlen Lightsey: [name] Other Clinician: Referring Zelma Mazariego: Treating Vella Colquitt/Extender: Tanna Savoy in Treatment: 61 Wound Status Wound Number: 41 Primary Diabetic Wound/Ulcer of the Lower Extremity Etiology: Wound Location: Right Metatarsal head second Wound Open Wounding Event: Gradually Appeared Status: Date Acquired: 07/23/2019 Comorbid Cataracts, Anemia, Arrhythmia, Congestive Heart Failure, Weeks Of Treatment: 54 History: Hypertension, Peripheral Arterial Disease, Type II Diabetes, End Clustered Wound: No Stage Renal Disease, History of Burn, Osteoarthritis, Osteomyelitis, Neuropathy Wound Measurements Length: (cm) 0.2 Width: (cm) 0.2 Depth: (cm) 0.3 Area: (cm) 0.031 Volume: (cm) 0.009 % Reduction in Area: 98.2% % Reduction in Volume: 98.3% Epithelialization: None Tunneling: No Undermining: No Wound Description Classification: Grade 2 Wound Margin: Thickened Exudate Amount: Small Exudate Type: Serosanguineous Exudate Color: red, brown Foul Odor After Cleansing: No Slough/Fibrino Yes Wound Bed Granulation Amount: Large (67-100%) Exposed Structure Granulation Quality: Red, Pink Fascia Exposed: No Necrotic Amount: Small (1-33%) Fat Layer (Subcutaneous Tissue) Exposed: Yes Necrotic Quality: Adherent Slough Tendon Exposed: No Muscle Exposed: No Joint Exposed: No Bone Exposed: No Electronic Signature(s) Signed: 08/22/2020 5:50:16 PM By: Carlene Coria RN Entered By: Carlene Coria on 08/14/2020 15:27:08 -------------------------------------------------------------------------------- Wound Assessment  Details Patient Name: Date of Service: [name], [name] 08/14/2020 3:00 PM Medical Record Number: [id number] Patient Account Number: [id number] Date of Birth/Sex: Treating RN: 1951-10-13 (69 y.o. [name] Primary Care Patience Nuzzo: [name] Other Clinician: Referring Calle Schader: Treating Lyndal Reggio/Extender: Tanna Savoy in Treatment: 61 Wound Status Wound Number: 32 Primary Skin T ear Etiology: Wound Location: Left Forearm Wound Open Wounding Event: Trauma Status: Date Acquired: 08/12/2020 Comorbid Cataracts, Anemia, Arrhythmia, Congestive Heart Failure, Weeks Of Treatment: 0 History: Hypertension, Peripheral Arterial Disease, Type II Diabetes, End Clustered Wound: No Stage Renal Disease, History of Burn, Osteoarthritis, Osteomyelitis, Neuropathy Photos Wound Measurements Length: (cm) 2.5 Width: (cm) 1.1 Depth: (cm) 0.1 Area: (cm) 2.16 Volume: (cm) 0.216 % Reduction in Area: 0% % Reduction in Volume: 0% Epithelialization: None Tunneling: No Undermining: No Wound Description Classification: Full Thickness Without Exposed Support Structures Exudate Amount: Medium Exudate Type: Serosanguineous Exudate Color: red, brown Foul Odor After Cleansing: No Slough/Fibrino No Wound Bed Granulation Amount: Large (67-100%) Exposed Structure Granulation Quality: Red Fascia Exposed: No Fat Layer (Subcutaneous Tissue) Exposed: Yes Tendon Exposed: No Muscle Exposed: No Joint Exposed: No Bone Exposed: No Electronic Signature(s) Signed: 08/15/2020 4:33:41 PM By: Mikeal Hawthorne EMT/HBOT/SD Signed: 08/18/2020 4:21:43 PM By: Levan Hurst RN, BSN Entered By: Mikeal Hawthorne on 08/15/2020 14:51:43 -------------------------------------------------------------------------------- Vitals Details Patient Name: Date of Service: [name] EL B. 08/14/2020 3:00 PM Medical Record Number: [id number] Patient Account Number: [id number] Date of  Birth/Sex: Treating RN: July 12, 1951 (68 y.o. [name]) Dolores Lory, [place] Primary Care Melba Araki: [name] Other Clinician: Referring Renarda Mullinix: Treating Bobbi Kozakiewicz/Extender: Tanna Savoy in Treatment: 61 Vital Signs Time Taken: 15:30 Temperature (F): 98.1 Height (in): 73 Pulse (bpm): 80 Weight (lbs): 202 Respiratory Rate (breaths/min): 18 Body Mass Index (BMI): 26.6 Blood Pressure (mmHg): 116/70 Reference Range: 80 - 120 mg / dl Electronic Signature(s) Signed: 08/22/2020 5:50:16 PM By: Carlene Coria RN Entered By: Carlene Coria on 08/14/2020 15:21:35

## 2020-08-23 NOTE — Progress Notes (Signed)
JOHNPAUL, GILLENTINE ([id number]) Visit Report for 08/14/2020 Fall Risk Assessment Details Patient Name: Date of Service: ALMALIK, WEISSBERG 08/14/2020 3:00 PM Medical Record Number: [id number] Patient Account Number: [id number] Date of Birth/Sex: Treating RN: 1951-02-22 (68 y.o. [name]) Carlene Coria Primary Care Marrietta Thunder: [name] Other Clinician: Referring Haitham Dolinsky: Treating Yoshimi Sarr/Extender: Tanna Savoy in Treatment: 12 Fall Risk Assessment Items Have you had 2 or more falls in the last 12 monthso 0 Yes Have you had any fall that resulted in injury in the last 12 monthso 0 Yes FALLS RISK SCREEN History of falling - immediate or within 3 months 0 No Secondary diagnosis (Do you have 2 or more medical diagnoseso) 0 No Ambulatory aid None/bed rest/wheelchair/nurse 0 No Crutches/cane/walker 0 No Furniture 0 No Intravenous therapy Access/Saline/Heparin Lock 0 No Gait/Transferring Normal/ bed rest/ wheelchair 0 No Weak (short steps with or without shuffle, stooped but able to lift head while walking, may seek 0 No support from furniture) Impaired (short steps with shuffle, may have difficulty arising from chair, head down, impaired 0 No balance) Mental Status Oriented to own ability 0 No Electronic Signature(s) Signed: 08/22/2020 5:50:16 PM By: Carlene Coria RN Entered By: Carlene Coria on 08/14/2020 15:20:02

## 2020-09-04 ENCOUNTER — Encounter (HOSPITAL_BASED_OUTPATIENT_CLINIC_OR_DEPARTMENT_OTHER): Payer: Medicare Other | Attending: Internal Medicine | Admitting: Internal Medicine

## 2020-09-04 ENCOUNTER — Other Ambulatory Visit: Payer: Self-pay

## 2020-09-04 DIAGNOSIS — I70208 Unspecified atherosclerosis of native arteries of extremities, other extremity: Secondary | ICD-10-CM | POA: Insufficient documentation

## 2020-09-04 DIAGNOSIS — Z89512 Acquired absence of left leg below knee: Secondary | ICD-10-CM | POA: Insufficient documentation

## 2020-09-04 DIAGNOSIS — E1122 Type 2 diabetes mellitus with diabetic chronic kidney disease: Secondary | ICD-10-CM | POA: Diagnosis not present

## 2020-09-04 DIAGNOSIS — E1151 Type 2 diabetes mellitus with diabetic peripheral angiopathy without gangrene: Secondary | ICD-10-CM | POA: Insufficient documentation

## 2020-09-04 DIAGNOSIS — Z89429 Acquired absence of other toe(s), unspecified side: Secondary | ICD-10-CM | POA: Insufficient documentation

## 2020-09-04 DIAGNOSIS — L97512 Non-pressure chronic ulcer of other part of right foot with fat layer exposed: Secondary | ICD-10-CM | POA: Diagnosis not present

## 2020-09-04 DIAGNOSIS — E1140 Type 2 diabetes mellitus with diabetic neuropathy, unspecified: Secondary | ICD-10-CM | POA: Insufficient documentation

## 2020-09-04 DIAGNOSIS — E11621 Type 2 diabetes mellitus with foot ulcer: Secondary | ICD-10-CM | POA: Diagnosis not present

## 2020-09-04 DIAGNOSIS — S50312A Abrasion of left elbow, initial encounter: Secondary | ICD-10-CM | POA: Diagnosis not present

## 2020-09-04 DIAGNOSIS — W19XXXA Unspecified fall, initial encounter: Secondary | ICD-10-CM | POA: Insufficient documentation

## 2020-09-04 DIAGNOSIS — M79645 Pain in left finger(s): Secondary | ICD-10-CM | POA: Diagnosis not present

## 2020-09-04 DIAGNOSIS — Z7902 Long term (current) use of antithrombotics/antiplatelets: Secondary | ICD-10-CM | POA: Diagnosis not present

## 2020-09-04 DIAGNOSIS — Z992 Dependence on renal dialysis: Secondary | ICD-10-CM | POA: Diagnosis not present

## 2020-09-04 DIAGNOSIS — N189 Chronic kidney disease, unspecified: Secondary | ICD-10-CM | POA: Diagnosis not present

## 2020-09-04 NOTE — Progress Notes (Signed)
[name], [name] ([id number]) Visit Report for 09/04/2020 Arrival Information Details Patient Name: Date of Service: [name], [name] 09/04/2020 12:30 PM Medical Record Number: [id number] Patient Account Number: [id number] Date of Birth/Sex: Treating RN: [birth year] (69 y.o. [name] Primary Care [name]: [name] Other Clinician: Referring [name]: Treating [name]/Extender: [name] in Treatment: 8 Visit Information History Since Last Visit Added or deleted any medications: No Patient Arrived: [name] Any new allergies or adverse reactions: No Arrival Time: 12:39 Had a fall or experienced change in No Accompanied By: self activities of daily living that may affect Transfer Assistance: None risk of falls: Patient Identification Verified: Yes Signs or symptoms of abuse/neglect since last visito No Secondary Verification Process Completed: Yes Hospitalized since last visit: No Patient Requires Transmission-Based Precautions: No Implantable device outside of the clinic excluding No Patient Has Alerts: No cellular tissue based products placed in the center since last visit: Has Dressing in Place as Prescribed: Yes Pain Present Now: No Electronic Signature(s) Signed: 09/04/2020 5:17:55 PM By: Kela Millin Entered By: Kela Millin on 09/04/2020 12:43:41 -------------------------------------------------------------------------------- Clinic Level of Care Assessment Details Patient Name: Date of Service: [name], [name] 09/04/2020 12:30 PM Medical Record Number: [id number] Patient Account Number: [id number] Date of Birth/Sex: Treating RN: [birth year] (69 y.o. [name], [name] Primary Care [name]: [name] Other Clinician: Referring [name]: Treating [name]/Extender: [name] in Treatment: 64 Clinic Level of Care Assessment Items TOOL 4 Quantity Score X- 1 0 Use when only an EandM is  performed on FOLLOW-UP visit ASSESSMENTS - Nursing Assessment / Reassessment X- 1 10 Reassessment of Co-morbidities (includes updates in patient status) X- 1 5 Reassessment of Adherence to Treatment Plan ASSESSMENTS - Wound and Skin A ssessment / Reassessment []  - 0 Simple Wound Assessment / Reassessment - one wound []  - 0 Complex Wound Assessment / Reassessment - multiple wounds X- 1 10 Dermatologic / Skin Assessment (not related to wound area) ASSESSMENTS - Focused Assessment X- 1 5 Circumferential Edema Measurements - multi extremities X- 1 10 Nutritional Assessment / Counseling / Intervention []  - 0 Lower Extremity Assessment (monofilament, tuning fork, pulses) []  - 0 Peripheral Arterial Disease Assessment (using hand held doppler) ASSESSMENTS - Ostomy and/or Continence Assessment and Care []  - 0 Incontinence Assessment and Management []  - 0 Ostomy Care Assessment and Management (repouching, etc.) PROCESS - Coordination of Care X - Simple Patient / Family Education for ongoing care 1 15 []  - 0 Complex (extensive) Patient / Family Education for ongoing care []  - 0 Staff obtains Programmer, systems, Records, T Results / Process Orders est []  - 0 Staff telephones HHA, Nursing Homes / Clarify orders / etc []  - 0 Routine Transfer to another Facility (non-emergent condition) []  - 0 Routine Hospital Admission (non-emergent condition) []  - 0 New Admissions / Biomedical engineer / Ordering NPWT Apligraf, etc. , []  - 0 Emergency Hospital Admission (emergent condition) X- 1 10 Simple Discharge Coordination []  - 0 Complex (extensive) Discharge Coordination PROCESS - Special Needs []  - 0 Pediatric / Minor Patient Management []  - 0 Isolation Patient Management []  - 0 Hearing / Language / Visual special needs []  - 0 Assessment of Community assistance (transportation, D/C planning, etc.) []  - 0 Additional assistance / Altered mentation []  - 0 Support Surface(s) Assessment  (bed, cushion, seat, etc.) INTERVENTIONS - Wound Cleansing / Measurement []  - 0 Simple Wound Cleansing - one wound []  - 0 Complex Wound Cleansing - multiple wounds []  - 0 Wound  Imaging (photographs - any number of wounds) []  - 0 Wound Tracing (instead of photographs) []  - 0 Simple Wound Measurement - one wound []  - 0 Complex Wound Measurement - multiple wounds INTERVENTIONS - Wound Dressings []  - 0 Small Wound Dressing one or multiple wounds []  - 0 Medium Wound Dressing one or multiple wounds []  - 0 Large Wound Dressing one or multiple wounds []  - 0 Application of Medications - topical []  - 0 Application of Medications - injection INTERVENTIONS - Miscellaneous []  - 0 External ear exam []  - 0 Specimen Collection (cultures, biopsies, blood, body fluids, etc.) []  - 0 Specimen(s) / Culture(s) sent or taken to Lab for analysis []  - 0 Patient Transfer (multiple staff / Civil Service fast streamer / Similar devices) []  - 0 Simple Staple / Suture removal (25 or less) []  - 0 Complex Staple / Suture removal (26 or more) []  - 0 Hypo / Hyperglycemic Management (close monitor of Blood Glucose) []  - 0 Ankle / Brachial Index (ABI) - do not check if billed separately X- 1 5 Vital Signs Has the patient been seen at the hospital within the last three years: Yes Total Score: 70 Level Of Care: New/Established - Level 2 Electronic Signature(s) Signed: 09/04/2020 5:40:39 PM By: Deon Pilling Entered By: Deon Pilling on 09/04/2020 13:11:19 -------------------------------------------------------------------------------- Encounter Discharge Information Details Patient Name: Date of Service: [name] Shines B. 09/04/2020 12:30 PM Medical Record Number: [id number] Patient Account Number: [id number] Date of Birth/Sex: Treating RN: 08/03/1951 (69 y.o. [name] Primary Care Laythan Hayter: [name] Other Clinician: Referring Clyde Upshaw: Treating Bernhardt Riemenschneider/Extender: [name]  in Treatment: 64 Encounter Discharge Information Items Discharge Condition: Stable Ambulatory Status: Cane Discharge Destination: Home Transportation: Private Auto Accompanied By: self Schedule Follow-up Appointment: Yes Clinical Summary of Care: Patient Declined Electronic Signature(s) Signed: 09/04/2020 5:07:23 PM By: Baruch Gouty RN, BSN Entered By: Baruch Gouty on 09/04/2020 13:11:45 -------------------------------------------------------------------------------- Lower Extremity Assessment Details Patient Name: Date of Service: [name], [name] 09/04/2020 12:30 PM Medical Record Number: [id number] Patient Account Number: [id number] Date of Birth/Sex: Treating RN: 09-02-1951 (69 y.o. [name] Primary Care Ziyan Schoon: [name] Other Clinician: Referring Dellamae Rosamilia: Treating Martin Belling/Extender: [name] in Treatment: 78 Electronic Signature(s) Signed: 09/04/2020 5:17:55 PM By: Kela Millin Entered By: Kela Millin on 09/04/2020 12:44:38 -------------------------------------------------------------------------------- Multi-Disciplinary Care Plan Details Patient Name: Date of Service: [name], [name] 09/04/2020 12:30 PM Medical Record Number: [id number] Patient Account Number: [id number] Date of Birth/Sex: Treating RN: 1951-02-22 (69 y.o. [name] Primary Care Jahmar Mckelvy: [name] Other Clinician: Referring Luisalberto Beegle: Treating Annalycia Done/Extender: [name] in Treatment: 73 Active Inactive Electronic Signature(s) Signed: 09/04/2020 5:40:39 PM By: Deon Pilling Entered By: Deon Pilling on 09/04/2020 12:47:49 -------------------------------------------------------------------------------- Pain Assessment Details Patient Name: Date of Service: [name], [name] 09/04/2020 12:30 PM Medical Record Number: [id number] Patient Account Number: [id number] Date of Birth/Sex: Treating  RN: [birth year] (69 y.o. [name] Primary Care Phylisha Dix: [name] Other Clinician: Referring Vamsi Apfel: Treating Teandra Harlan/Extender: [name] in Treatment: 64 Active Problems Location of Pain Severity and Description of Pain Patient Has Paino No Site Locations Pain Management and Medication Current Pain Management: Electronic Signature(s) Signed: 09/04/2020 5:17:55 PM By: Kela Millin Entered By: Kela Millin on 09/04/2020 12:44:27 -------------------------------------------------------------------------------- Patient/Caregiver Education Details Patient Name: Date of Service: [name] 9/9/2021andnbsp12:30 PM Medical Record Number: [id number] Patient Account Number: [id number] Date of Birth/Gender: Treating RN: 10-Feb-1951 (69 y.o. [name] Primary Care  Physician: [name] Other Clinician: Referring Physician: Treating Physician/Extender: [name] in Treatment: 68 Education Assessment Education Provided To: Patient Education Topics Provided Wound/Skin Impairment: Handouts: Skin Care Do's and Dont's Methods: Explain/Verbal Responses: Reinforcements needed Electronic Signature(s) Signed: 09/04/2020 5:40:39 PM By: Deon Pilling Entered By: Deon Pilling on 09/04/2020 12:48:11 -------------------------------------------------------------------------------- Vitals Details Patient Name: Date of Service: [name] Shines B. 09/04/2020 12:30 PM Medical Record Number: [id number] Patient Account Number: [id number] Date of Birth/Sex: Treating RN: 1951/10/26 (69 y.o. [name] Primary Care Bertie Simien: [name] Other Clinician: Referring Brytney Somes: Treating Gaylin Osoria/Extender: [name] in Treatment: 64 Vital Signs Time Taken: 12:43 Temperature (F): 98.9 Height (in): 73 Pulse (bpm): 87 Weight (lbs): 202 Respiratory Rate (breaths/min):  18 Body Mass Index (BMI): 26.6 Blood Pressure (mmHg): 110/68 Reference Range: 80 - 120 mg / dl Electronic Signature(s) Signed: 09/04/2020 5:17:55 PM By: Kela Millin Entered By: Kela Millin on 09/04/2020 12:44:20

## 2020-09-05 NOTE — Progress Notes (Signed)
[name], GAGLIO ([id number]) Visit Report for 09/04/2020 HPI Details Patient Name: Date of Service: [name], [name] 09/04/2020 12:30 PM Medical Record Number: [id number] Patient Account Number: [id number] Date of Birth/Sex: Treating RN: [birth year] (69 y.o. [name] Primary Care Provider: [name] Other Clinician: Referring Provider: Treating Provider/Extender: Tanna Savoy in Treatment: 64 History of Present Illness Location: Patient presents with a wound to bilateral feet. Quality: Patient reports experiencing essentially no pain. Severity: Mildly severe wound with no evidence of infection Duration: Patient has had the wound for greater than 2 weeks prior to presenting for treatment HPI Description: Stomatitis cyst the patient is a pleasant 69 yrs old bm here for evaluation of ulcers on the plantar aspect of both feet. 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 He has a left arm graft for access. He has been trying to stay off his feet for weeks but does not seem to have any improvement in the wound. He has been seeing someone at the foot center and was referred to the Wound Care center for further evaluation. 12/30/15 the patient has 3 wounds one over the right first metatarsal head and 2 on the left foot at the left fifth and lleft first metatarsal head. All of these look relatively similar. The one over the left fifth probe to bone I could not prove that any of the others did. I note his MRI in November that did not show osteomyelitis. His peripheral pulses seem robust. All of these underwent surgical debridement to remove callus nonviable skin and subcutaneous tissue 01/06/16; the patient had his sutures removed from the right fifth ray amputation. There may be a small open part of this superiorly but otherwise the incision looks good. Areas over his right first, left first and left fifth metatarsal  head all underwent surgical debridement as a varying degree of callus, skin and nonviable subcutaneous tissue. The area that is most worrisome is the right fifth metatarsal head which has a wound probes precariously close to bone. There is no purulent drainage or erythema 01/13/16; I'm not exactly sure of the status of the right fifth ray amputation site however he follows with Dr. [name] later this week. The area over his right first plantar metatarsal head, left first and fifth plantar metatarsal head are all in the same status. Thick circumferential callus, nonviable subcutaneous tissue. Culture of the left fifth did not culture last week 01/20/16; all of the patient's wounds appear and roughly the same state although his amputation site on the right lateral foot looks better. His wounds over the right first, left first and left fifth metatarsal heads all underwent difficult surgical debridement removing circumferential callus nonviable skin and subcutaneous tissue. There is no overt evidence of infection in these areas. MRI at the end of December of the left foot did not show osteomyelitis, right foot showed osteomyelitis of the right fifth digit he is now status post amputation. 01/27/16 the patient's wounds over his plantar first and fifth metatarsal heads on the left all appear better having been started on a total contact cast last week. They were debridement of circumferential callus and nonviable subcutaneous tissue as was the wound over the first metatarsal head. His surgical incision on the right also had some light surface debridement done. 03/23/2016 -- the patient was doing really well and most of his wounds had almost completely healed but now he came back today with a history  of having a discharge from the area of his right foot on the plantar aspect and also between his first and second toe. He also has had some discharge from the left foot. Addendum: I spoke to the PA [name] at the dialysis center whose fax number is [phone]. We discussed the infection the patient has and she will put the patient on vancomycin and Fortaz until the final culture report is back. We will fax this as soon as available. 03/30/2016 -- left foot x-ray IMPRESSION:No definitive osteomyelitis noted. X-ray of the right foot -- IMPRESSION: 1. Soft tissue swelling. Prior amputation right fifth digit. No acute or focal bony abnormality identified. If osteomyelitis remains a clinical concern, MRI can be obtained. 2. Peripheral vascular disease. His culture reports have grown an MSSA -- and we will fax this report to his hemodialysis center. He was called in a prescription of oral doxycycline but I have told her not to fill this in as he is already on IV antibiotics. 04/06/2016 -- a few days ago, I spoke to the hemodialysis center nurse who had stopped the IV antibiotics and he was given a prescription for doxycycline 100 mg by mouth twice a day for a week and he is on this at the present time. 05/11/2016 -- he has recently seen his PCP this week and his hemoglobin A1c was 7. He is working on his paperwork to get his orthotic shoes. 05/25/2016 -- -- x-ray of the left foot IMPRESSION:No acute bony abnormality. No radiographic changes of acute osteomyelitis. No change since prior study. X-ray of the right foot -- IMPRESSION: Postsurgical changes are seen involving the fifth toe. No evidence of acute osteomyelitis. he developed a large blister on the medial part of his right foot and this opened out and drain fluid. 06/01/2016 -- he has his MRI to be done this afternoon. 06/15/2016 -- MRI of the left forefoot without contrast shows 2 separate regions of cutaneous and subcutaneous edema and possible ulceration and blistering along the ball of the foot. No obvious osteomyelitis identified. MRI of the right foot showed cutaneous and subcutaneous thickening plantar to the first digit sesamoid  with an ulcer crater but no underlying osteomyelitis is identified. 06/22/2016 -- the right foot plantar ulcer has been draining a lot of seropurulent material for the last few days. 06/30/2016 -- spoke to the dialysis center and I believe I spoke to [place] a PA at the center who discussed with me and agreed to putting [name] on vancomycin until his cultures arrive. On review of his culture report no WBCs were seen or no organisms were seen and the culture was reincubated for better growth. The final report is back and there were no predominant growth including Streptococcus or Staphylococcus. Clinically though he has a lot of drainage from both wounds a lot of undermining and there is further blebs on the left foot towards the interspace between his first and second toe. 08/10/2016 -- the culture from the right foot showed normal skin flora and there was no Staphylococcus aureus was group A streptococcus isolated. 09/21/2016 -- -- MRI of the right foot was done on 09/13/2016 - IMPRESSION: Findings most consistent with acute osteomyelitis throughout the great toe, sesamoid bones and plantar aspect of the head of the first metatarsal. Fluid in the sheath of the flexor tendon of the great toe could be sympathetic but is worrisome for septic tenosynovitis. First MTP joint effusion worrisome for septic joint. He was admitted to the hospital  on 09/11/2016 and treated for a fever with vancomycin and cefepime. He was seen by Dr. Bobby Rumpf of infectious disease who recommended 6 weeks treatment with vancomycin and ceftazidime with his hemodialysis and to continue to see as in the wound clinic. Vascular consult was pending. The patient was discharged home on 09/14/2016 and he would continue with IV antibiotics for 6 weeks. 09/28/16 wound appears reasonably healthy. Continuing with total contact cast 10/05/16 wound is smaller and looking healthy. Continue with total contact cast. He continues on IV  antibiotics 10/12/2016 -- he has developed a new wound on the dorsal aspect of his left big toe and this is a superficial injury with no surrounding cellulitis. He has completed 30 days of IV antibiotics and is now ready to start his hyperbaric oxygen therapy as per his insurance company's recommendation. 10/19/2016 -- after the cast was removed on the right side he has got good resolution of his ulceration on the right plantar foot. he has a new wound on the left plantar foot in the region of his fourth metatarsal and this will need sharp debridement. 10/26/2016 -- Xray of the right foot complete: IMPRESSION: Changes consistent with osteomyelitis involving the head of the first metatarsal and base of the first proximal phalanx. The sesamoid bones are also likely involved given their positioning. 11/02/2016 -- there still awaiting insurance clearance for his hyperbaric oxygen therapy and hopefully he will begin treatment soon. 11/09/2016 -- started with hyperbaric oxygen therapy and had some barotrauma to the right ear and this was seen by ENT who was prescribed Afrin drops and would probably continue with HBO and he is scheduled for myringotomy tubes on Friday 11/16/2016 -- he had his myringotomy tubes placed on Friday and has been doing much better after that with some fluid draining out after hyperbaric treatment today. The pain was much better. 11/30/2016 -- over the last 2 days he noticed a swelling and change of color of his right second toe and this had been draining minimal fluid. 12/07/2016 -- x-ray of the right foot -- IMPRESSION:1. Progressive ulceration at the distal aspect of the second digit with significant soft tissue swelling and osseous changes in the distal phalanx compatible with osteomyelitis. 2. Chronic osteomyelitis at the first MTP joint. 3. Ulcerations at the second and third toes as well without definite osseous changes. Osteomyelitis is not excluded. 4. Fifth digit  amputation. On 12/01/2016 I spoke to Dr. Bobby Rumpf, the infectious disease specialist, who kindly agreed to treat this with IV antibiotics and he would call in the order to the dialysis center and this has been discussed in detail with the patient who will make the appropriate arrangements. The patient will also book an appointment as soon as possible to see Dr. [name] in the office. Of note the patient has not been on antibiotics this entire week as the dialysis center did not receive any orders from Dr. [name]. I got in touch with Dr. [name] who tells me the patient has an appointment to see him this coming Wednesday. 12/14/2016 -- the patient is on ceftazidime and vancomycin during his dialysis, and I understand this was put on by his nephrologist Dr. Raliegh Ip possibly after speaking with infectious disease Dr. [name] 12/23/16; patient was on my schedule today for a wound evaluation as he had difficulties with his schedule earlier this week. I note that he is on vancomycin and ceftazidine at dialysis. In spite of this he arrives today with a new wound on the base  of the right second toe this easily probes to bone. The known wound at the tip of the second toe With this area. He also has a superficial area on the medial aspect of the third toe on the side of the DIP. This does not appear to have much depth. The area on the plantar left foot is a deep area but did not probe the bone 12/28/16 -- he has brought in some lab work and the most recent labs done showed a hemoglobin of 12.1 hematocrit of 36.3, neutrophils of 51% WBC count of 5.6, BN of 53, albumin of 4.1 globulin of 3.7, vancomycin 13 g per mL. 01/04/2017 -- he saw Dr. [name] who has recommended a amputation of the right second toe and he is awaiting this date. He sees Dr. Bobby Rumpf of infectious disease tomorrow. The bone culture taken on 12/28/2016 had no growth in 2 days. 01/11/2017 -- was seen by Dr. Bobby Rumpf regarding the  management and has recommended a eval by vascular surgeons. He recommended to continue the antibiotics during his hemodialysis. Xray of the left foot -- IMPRESSION: No acute fracture, dislocation, or osseous erosion identified. 01/18/2017 -- he has his vascular workup later today and he is going to have his right foot second toe amputation this coming Friday by Dr. [name]. We have also put in for a another 30 treatments with hyperbaric oxygen therapy 01/25/2017 -- I have reviewed Dr. Donnetta Hutching is vascular report from last week where he reviewed him and thought his vascular function was good enough to heal his amputation site and no further tests were recommended. He also had his orthopedic related to surgery which is still pending the notes and we will review these next week. 02/01/2017 -- the operative remote of Dr. 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 02/08/2017 --he was seen by Dr. [name] on 02/03/2017 -- patient is on [place]. after a thorough review he had recommended to continue with antibiotics during hemodialysis. The patient was seen by Dr. [name], but I do not find any follow-up note on the electronic medical record. he has removed the dressing over the right foot to remove the sutures and asked him to see him back in a month's time 02/22/2017 -- patient has been febrile and has been having symptoms of the upper respiratory tract infection but has not been checked for the flu and it's been over 4 days now. He says he is feeling better today. Some drainage between his left first and second toe and this needed to be looked  at 03/08/2017 -- was seen by Dr. Bobby Rumpf on 03/07/2017 after review he will stop his antibiotics and see him back in a month to see if he is feeling well. 03/15/2017 -- he has completed his course of hyperbaric oxygen therapy and is doing fine with his health otherwise. 03/22/2017 -- his nutritionist at the dialysis center has recommended a protein supplement to help build his collagen and we will prescribe this for him when he has the details 05/03/2017 -- he recently noticed the area on the plantar aspect of his fifth metatarsal head which opened out and had minimal drainage. 05/10/2017 -- --  x-ray of the left foot -- IMPRESSION:1. No convincing conventional radiographic evidence of active osteomyelitis.2. Active soft tissue ulceration at the tip of the fifth digit, and at the lateral aspect of the foot adjacent the base of the fifth metatarsal. 3. Surgical changes of prior fourth toe amputation. 4. 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 5. Small vessel atherosclerotic vascular calcifications. 6. 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 ======= Readmission after 5 weeks: 06/15/2017 -- the patient returns after 5 weeks having had an MRI done on 05/17/2017 MRI of the left foot without contrast showed soft tissue ulcer overlying the base of the fifth metatarsal. 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 No drainable fluid collection to suggest an abscess. He was in hospital between 05/16/2017 and 05/25/2017 -- and on discharge was asked to follow-up with Dr. [name] and Dr. [name]. He was treated 2 weeks post discharge with vancomycin plus ceftriaxone with hemodialysis and oral metronidazole 500 mg 3 times a day. 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 last hemoglobin A1c was  6.7% He had a postoperative MR of the foot on 05/23/2017 -- which showed no new areas of cortical bone loss, edema or soft tissue ulceration to suggest osteomyelitis. the patient has completed his course of IV antibiotics during dialysis and is to see the infectious disease doctor tomorrow. Dr. [name] had seen him after suture removal and asked him to keep the wound with a dry dressing 06/21/2017 -- he was seen by Dr. Bobby Rumpf of infectious disease on 06/16/2017 -- he stopped his Flagyl and will see him back in 6 weeks. He was asked to follow-up with Dr. [name] and with the wound clinic clinic. 07/05/17; bone biopsy from last time showed acute osteomyelitis. This is from the reminiscent in the left fifth metatarsal. Culture result is apparently still pending [holding for anaerobe}. From my understanding in this case this is been a progressive necrotic wound which is deteriorated markedly over the last 3 weeks since he returned here. He now has a large area of exposed bone which was biopsied and cultured last week. Dr. Graylon Good has put him on vancomycin and Fortaz during his hemodialysis and Flagyl orally. He is to see Dr. [name] next week 07/19/2017 -- the patient was reviewed by Dr. [name] of orthopedics who reviewed the case in detail and agreed with the plan to continue with IV antibiotics, aggressive wound care and hyperbaric oxygen therapy. He would see him back in 3 weeks' time 08/09/2017 -- saw Dr. Bobby Rumpf on 08/03/2017 -he was restarted on his antibiotics for 6 weeks which included vanco, ceftaz and Flagyl. he recommended continuing his antibiotics for 6 weeks and reevaluate his completion date. He would continue with wound care and hyperbaric oxygen therapy. 08/16/2017 -- I understand he will be completing his 6 weeks of antibiotics sometime later this week. 09/19/2017 -- he has been without his wound VAC for the last week due to lack of supplies. He has been packing his wound  with silver alginate. 10/04/2017 -- he has an appointment with Dr. [name] tomorrow and hence we will not apply the wound VAC after his dressing changes today. 10/11/2017 -- he was seen by Dr. Bobby Rumpf on 10/05/2017, noted that the patient was currently off antibiotics, and after thorough review he recommended he follow-up with the wound  care as per plan and no further antibiotics at this point. 11/29/17; patient is arrived for the wound on his left lateral foot.Marland Kitchen He is completed IV antibiotics and 2 rounds of hyperbaric oxygen for treatment of underlying osteomyelitis. He arrives today with a surface on most of the wound area however our intake nurse noted drainage from the superior aspect. This was brought to my attention. 12/13/2017 -- the right plantar foot had a large bleb and once the bleb was opened out a large callus and subcutaneous debris was removed and he has a plantar ulcer near the fourth metatarsal head 12/28/17 on evaluation today patient appears to be doing acutely worse in regard to his left foot. The wound which has been appearing to do better as now open up more deeply there is bone palpable at the base of the wound unfortunately. He tells me that this feels "like it did when he had osteomyelitis previously" he also noted that his second toe on the left foot appears to be doing worse and is swollen there does appear to be some fluid collected underneath. His right foot plantar ulcer appears to be doing somewhat better at this point and there really is no complication at the site currently. No fevers, chills, nausea, or vomiting noted at this time. Patient states that he normally has no pain at this site however. T oday he is having significant pain. 01/03/18; culture done last week showed methicillin sensitive staph aureus and group B strep. He was on [place] however he arrives today with a fever of 101. He has a functional dialysis shunt in his left arm but has had no pain here.  No cough. He still makes some urine no dysuria he did have abdominal pain nausea and diarrhea over the weekend but is not had any diarrhea since yesterday. Otherwise he has no specific complaints 01/05/18; the patient returns today in follow-up for his presentation from 1/8. At that point he was febrile. I gave him some Levaquin adjusted for his dialysis status. He tells me the fever broke that night and it is not likely that this was actually a wound infection. He is gone on to have an MRI of the left foot; this showed interval development of an abnormal signal from the base of the third and fourth metatarsal and in the cuboid reminiscent consistent with osteomyelitis. There is no mention of the left second toe I think which was a concern when it was ordered. The patient is taking his Levaquin 01/10/18; the patient has completed his antibiotics today. The area on the left lateral foot is smaller but it still probes easily to bone. He has underlying osteomyelitis here and sees Dr. Megan Salon of infectious disease this week The area on the right third plantar metatarsal head still requires debridement not much change in dimensions He has a new wound on the medial tip of his right third toe again this probes to bone. He only noticed this 2 days ago 01/17/18; the patient saw Dr. [name] last week and he is back on Vanco and Fortaz at dialysis. 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 We are previously seeing him for a wound also on the base of the fourth med head on the right. X-ray of this area did not show osteomyelitis in relation to a new wound on the tip of his right third toe. Again  this probes to bone. Finally his second toe on the left which didn't really show anything on the MRI at least no report appears to have a separated cutaneous area which I think is going to come right off the tip  of his toe and leave exposed bone. Whether this is infectious or ischemic I am not clear Culture I did from the third toe last week which was his new wound was negative. Plain x-ray on the right foot did not show osteomyelitis in the toes 01/24/18; the patient is going went to see Dr. [name]. He is going to have a amputation of the left second and right third toe although paradoxically the left second toe looks better than last week. We have treating a deep probing wound on the left lateral foot and the area over the third metatarsal head on the right foot. 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 Also a debridement including bone of the left lateral foot and a closure which is still sutured. This is a bit surprising. Also apparent debridement of the base of the right third toe wound. 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 I'm not sure exactly where we are in the frame of things here. The wound on the left lateral foot is still sutured also the amputation sites of the left second and right third toe. He is been using Xeroform to the sutured areas silver alginate to the right foot 02/14/18; he continues on Rocephin at dialysis for osteomyelitis. I still don't have a good sense of where we are in the treatment duration. The wounds on the left lateral foot had the sutures removed and this clearly still probes deeply. We debrided the area with a #5 curet. We'll use silver alginate to the wound the area over the right third met head also required debridement of callus skin and subcutaneous tissue and necrotic debris over the wound surface. This tunnels superiorly but I did not unroofed this today. The areas on the tip of his toes have a crusted surface eschar I did not  debridement this either. 02/21/18; he continues on Rocephin at dialysis for osteomyelitis. I don't have a good sense of where we are and the treatment duration. The wounds on the left lateral foot is callused over on presentation but requires debridement. The area on the right third med head plantar aspect actually is measuring smaller 02/28/18;patient is on Vanco and Fortaz at dialysis, I'm not sure why I thought this was Rocephin above unless it is been changed. I don't have a good sense of time frame here. Using silver collagen to the area on the left lateral foot and right third med head plantar foot 03/07/18; patient is completing bank and Fortaz at dialysis soon. He is using Silver collagen to the area on the left lateral foot and the right third metatarsal head. He has a smaller superficial wound distal to the left lateral foot wound. 03/14/18-he is here in follow-up evaluation for multiple ulcerations to his bilateral feet. He presents with a new ulceration to the plantar aspect of the left foot underneath the blister, this was deroofed to reveal a partial thickness ulcer. He is voicing no complaints or concerns, tolerated dialysis yesterday. We will continue with same treatment plan and  he will follow-up next week 03/21/18; the patient has a area on the lateral left foot which still has a small probing area. The overall surface area of the wound is better. He presented with a new ulceration on the left foot plantar fifth metatarsal head last week. The area on the right third metatarsal head appears smaller.using silver alginate to all wound areas. The patient is changing the dressing himself. He is using a Darco forefoot offloading are on the right and a healing sandal on the left 03/28/18; original wound left lateral foot. Still nowhere close to looking like a heeling surface secondary wound on the left lateral foot at the base of his question fifth metatarsal which was new last week Right third  metatarsal head. We have been using silver alginate all wounds 04/04/18; the original wound on the left lateral foot again heavy callus surrounding thick nonviable subcutaneous tissue all requiring debridement. The new wound from 2 weeks ago just near this at the base of the fifth metatarsal head still looks about the same Unfortunately there is been deterioration in the third medical head wound on the right which now probes to bone. I must say this was a superficial wound at one point in time and I really don't have a good frame of reference year. I'll have to go back to look through records about what we know about the right foot. 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 He is also been to see Dr. [name]. Somewhere in here as somebody is ordered a VASCULAR evaluation which was done on 03/22/18. On the right is anterior tibial artery was monophasic triphasic at the posterior tibial artery. On the left anterior tibial artery monophasic posterior tibial artery monophasic. ABI in the right was 1.43 on the left 1.21. TBIs on the right at 0.41 and on the left at 0.32. A vascular consult was recommended and I think has been arranged. 04/11/18; [name] has not had an MRI of the right foot since 2017. Recent x-ray of the right foot done in January and February was negative. However he has had a major deterioration in the wound over the third met head. He is completed antibiotics last week at dialysis [place]. I think he is going to see vascular in follow-up. The areas on the left lateral foot and left plantar fifth metatarsal head both look satisfactory. I debrided both of these areas although the tissue here looks good. The area on the left lateral foot once probe to bone it certainly does not do that now 04/18/18; MRI of the right foot is on Thursday. He has what looks to be serosanguineous purulent  drainage coming out of the wound over the right third metatarsal head today. He completed antibiotics bank and Fortaz 2 weeks ago at dialysis. He has a vascular follow-up with regards to his arterial insufficiency although I don't exactly see when that is booked. The areas on the left foot including the lateral left foot and the plantar left fifth metatarsal head look about the same. 04/25/18-He is here in follow-up evaluation for bilateral foot ulcers. MRI obtained was negative for osteomyelitis. Wound culture was negative. We will continue with same treatment plan he'll follow-up next week 05/02/18; the right plantar foot wound over the third metatarsal head actually looks better than when I last saw this. His MRI was negative for osteomyelitis wound culture was negative. On the  left plantar foot both wounds on the plantar fifth metatarsal head and on the lateral foot both are covered in a very hard circumferential callus. 05/09/18; right plantar foot wound over the third metatarsal head stable from last week. Left plantar foot wound over the fifth metatarsal head also stable but with callus around both wound areas The area on the left lateral foot had thick callus over a surface and I had some thoughts about leaving this intact however it felt boggy. We've been using silver alginate all wounds 05/16/18; since the patient was last here he was hospitalized from 5/15 through 5/19. 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 This grew MRSA. He was placed on vancomycin.MRI of the left foot suggested "progressive" osteomyelitis and bone destruction of the cuboid and the base of the fifth metatarsal. Stable erosive changes of the base of the second metatarsal. I'm wondering if they're aware that he had surgery and debridement in the area of the underlying bone previously by Dr. [name]. In any case, He was  also revascularized with an angioplasty and stenting of the left tibial peroneal trunk and angioplasty of the left posterior tibial artery. He is on vancomycin at dialysis. He has a 2 week follow-up with infectious disease. He has vascular surgery follow-up. He was started on Plavix. 05/23/18; the patient's wound on the lateral left foot at the level of the fifth metatarsal head and the plantar wound on the plantar fifth metatarsal head both look better. The area on the right third metatarsal head still has depth and undermining. We've been using silver alginate. He is on vancomycin. He has been revascularized on the left 05/30/18; he continues on vancomycin at dialysis. Revascularized on the left. The area on the left lateral foot is just about closed. Unfortunately the area over the plantar fifth metatarsal head undermining superiorly as does the area over the right third metatarsal head. Both of these significantly deteriorated from last week. He has appointments next week with infectious disease and orthopedics I delayed putting on a cast on the left until those appointments which therefore we made we'll bring him in on Friday the 14th with the idea of a cast on the left foot 06/06/18; he continues on vancomycin at dialysis. Revascularized on the left. He arrives today with a ballotable swelling just above the area on the left lateral foot where his previous wound was. He has no pain but he is reasonably insensate. The difficult areas on the plantar left fifth metatarsal head looks stable whereas the area on the right third plantar metatarsal head about the same as last week there is undermining here although I did not unroofed this today. He required a considerable debridement of the swelling on the left lateral foot area and I unroofed an abscess with a copious amount of brown purulent material which I obtained for culture. I don't believe during his recent hospitalization he had any further imaging  although I need to review this. Previous cultures from this area done in this clinic showed MRSA, I would be surprised if this is not what this is currently even though he is on vancomycin. 06/13/18; he continues on vancomycin at dialysis however he finishes this on Sunday and then graduates the doxycycline previously prescribed by Dr. [name]. The abscess site on the lateral foot that I unroofed last time grew moderate amounts of methicillin-resistant staph aureus that  is both vancomycin and tetracycline sensitive so we should be okay from that regard. He arrives today in clinic with a connection between the abscess site and the area on the lateral foot. We've been using silver alginate to all his wound areas. The right third metatarsal head wound is measuring smaller. Using silver alginate on both wound areas 06/20/18; transitioning to doxycycline prescribed by Dr. [name]. The abscess site on the lateral foot that I unroofed 2 weeks ago grew MRSA. That area has largely closed down although the lateral part of his wound on the fifth metatarsal head still probes to bone. I suspect all of this was connected. On the right third metatarsal head smaller looking wound but surrounded by nonviable tissue that once again requires debridement using silver alginate on both areas 06/27/18; on doxycycline 100 twice a day. The abscess on the lateral foot has closed down. Although he still has the wound on the left fifth metatarsal head that extends towards the lateral part of the foot. The most lateral part of this wound probes to bone Right third metatarsal head still a deep probing wound with undermining. Nevertheless I elected not to debridement this this week If everything is copious static next week on the left I'm going to attempt a total contact cast 07/04/18; he is tolerating his doxycycline. The abscess on the left lateral foot is closed down although there is still a deep wound here that probes to bone. We  will use silver collagen under the total contact cast Silver collagen to the deep wound over the right third metatarsal head is well 07/11/18; he is tolerating doxycycline. The left lateral fifth plantar metatarsal head still probes deeply but I could not probe any bone. We've been using silver collagen and this will be the second week under the total contact cast Silver collagen to deep wound over the right third metatarsal head as well 07/18/18; he is still taking doxycycline. The left lateral fifth plantar metatarsal this week probes deeply with a large amount of exposed bone. Quite a deterioration. He required extensive debridement. Specimens of bone for pathology and CNS obtained Also considerable debridement on the right third metatarsal head 07/25/18; bone for pathology last week from the lateral left foot showed osteomyelitis. CandS of the bone Enterobacter. And Klebsiella. He is now on ciprofloxacin and the doxycycline is stopped [Dr. [name] of infectious disease] area He has an appointment with Dr. [name] tomorrow I'm going to leave the total contact cast off for this week. May wish to try to reapply that next week or the week after depending on the wound bed looks. I'm not sure if there is an operative option here, previously is followed with Dr. [name] 08/01/18 on evaluation today patient presents for reevaluation. He has been seen by infectious disease, Dr. [name], and he has placed the patient back on Ceftaz currently to be given to him at dialysis three times a week. 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 Fortunately there does not appear to be the evidence of infection at this point in time. Overall the patient has been tolerating the dressing changes without complication. We are no longer due lives in the cast of left foot secondary to the infection obviously. 08/10/18 on evaluation today patient appears to be  doing rather well all things considered in regard to his left plantar foot ulcer. He does have a significant callous on the lateral portion of  his left foot he wonders if I can help clean that away to some degree today. Fortunately he is having no evidence of infection at this time which is good news. No fevers chills noted. With that being said his right plantar foot actually does have a significant callous buildup around the wound opening this seems to not be doing as well as I would like at this point. I do believe that he would benefit from sharp debridement at this site. Subsequently I think a total contact cast would be helpful for the right foot as well. 08/17/18; the patient arrived today with a total contact cast on the right foot. This actually looks quite good. He had gone to see Dr. Megan Salon of infectious disease about the osteomyelitis on the left foot. I think he is on IV Fortaz at dialysis although I am not exactly sure of the rationale for the Fortaz at the time of this dictation. He also arrived in today with a swelling on the lateral left foot this is the site of his original wounds in fact when I first saw this man when he was under Dr. Ardeen [name] care I think this was the site of where the wound was located. It was not particularly tender however using a small scalpel I opened it to [place] some moderate amount of purulent drainage. 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 Last MRI was in May that showed progressive osteomyelitis and bone destruction of the cuboid and base of the fifth metatarsal. Last week he had an abscess over this same area this was removed. Culture was negative. We are continuing with a total contact cast to the area on the third metatarsal head on the right and making some good progress here. The patient asked me again about renal  transplant. He is not on the list because of the open wounds. 08/31/18; apparently the patient had an interruption in the [place] but he is now back on this at dialysis. Apparently there was a misunderstanding and Dr. Crissie Figures orders. Due to have the MRI of the left foot tonight. The area on the right foot continues to have callus thick skin and subcutaneous tissue around the wound edge that requires constant debridement however the wound is smaller we are using a total contact cast in this area. Alginate all wounds 09/07/18; without much surprise the MRI of his left foot showed osteomyelitis in the reminiscent of his fourth metatarsal but also the third metatarsal. She arrives in clinic today with the right foot and a total contact cast. There was purulent drainage coming out of the wound which I have cultured. Marked deterioration here with undermining widely around the wound orifice. Using pickups and a scalpel I remove callus and subcutaneous tissue from a substantial new opening. In a similar fashion the area on the left lateral foot that was blistered last week I have opened this and remove skin and subcutaneous tissue from this area to expose a obvious new wound. I think there is extension and communication between all of this on the left foot. The patient is on Fortaz at dialysis. Culture done of the right foot. We are clearly not making progress here. We have made him an appointment with Dr. [name] of orthopedics. I think an amputation of the left leg may be discussed. I don't think there is anything that can be done with  foot salvage. 09/14/18; culture from the right foot last time showed a very resistant MRSA. This is resistant to doxycycline. I'm going to try to get linezolid at least for a week. He does not have a appointment with Dr. [name] yet. 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 I'll send a message to Dr. [name] of  infectious disease. I want the patient to see Dr. [name] to go over the pros and cons of an amputation which I think will be a BKA. The patient had a question about whether this is curative or not. I told him that I thought it would be although spread of staph aureus infection is not unheard of. MRI of the right foot was done at the end of April 2019 did not show osteomyelitis. The patient last saw Dr. [name] on 9/3. I'll send Dr. [name] a message. I would really like him to weigh the pros and cons of a BKA on the left otherwise he'll probably need IV antibiotics and perhaps hyperbaric oxygen again. He has not had a good response to this in the past. His MRI earlier this month showed progressive damage in the remnants of the fourth and third metatarsal 09/21/2018; sees Dr. [name] of orthopedics next week to discuss a left BKA in response to the osteomyelitis in the left foot. Using silver alginate to all wound areas. He is completing the Zyvox I did put in for him after last culture showed MRSA 09/29/2018; patient saw Dr. [name] of orthopedics discussed the osteomyelitis in the left foot third and fourth metatarsals. He did not recommend urgent surgery but certainly stated the only surgical option would be a BKA. He is communicating with Dr. [name]. The problem here is the instability of the areas on his foot which constantly generate draining abscesses. I will communicate with Dr. [name] about this. He has an appointment on 10/23 10/05/2018; patient sees Dr. [name] on 10/23. I have sent him a secure message not ordered any additional antibiotics for now. Patient continues to have 2 open areas on the left plantar foot at the fifth metatarsal head and the lateral aspect of the foot. These have not changed all that much. The area on the right third met head still has thick callus and surrounding subcutaneous tissue we have been using silver alginate 10/12/2018; patient sees Dr. [name] or colleague on  10/23. 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 The area on the right plantar foot is just about closed. The superior wound on the left fifth metatarsal head is callused over but I am not sure if this is closed. The area below it is about the same. Considerable amount of callus on the lateral foot. We have been using silver alginate to the wounds 10/19/2018; the patient saw Dr. [name] on 10/22. He wishes to try and continue to save the left foot. He has been given 6 weeks of oral ciprofloxacin. We continue to put a total contact cast on the right foot. The area over the fifth metatarsal head on the left is callused over/perhaps healed but I did not remove the callus to find out. He still has the wound on the left lateral midfoot requiring debridement. We have been using silver alginate to all wound areas 10/26/2018 On the left foot our intake nurse noted some purulent drainage from the inferior wound [currently the only one that is  not callused over] On the right foot even though he is in total contact cast a considerable amount of thick black callus and surface eschar. On this side we have been using silver alginate under a total contact cast. he remains on ciprofloxacin as prescribed by Dr. [name] 11/02/2018; culture from last week which is done of the probing area on the left midfoot wound showed MRSA "few". I am going to need to contact Dr. [name] which I will do today I think he is going to need IV antibiotics again. The area on the left foot which was callused on the side is clearly separating today all of this was removed a copious amounts of callus and necrotic subcutaneous tissue. The area on the right plantar foot actually remained healthy looking with a healthy granulated base. We are using silver alginate to all wound areas 11/09/2018; unfortunately neither 1 of the patient's wounds areas looks at all satisfactory. On  the left he has considerable necrotic debris from the plantar wound laterally over the foot. I removed copious amounts of material including callus skin and subcutaneous tissue. On the right foot he arrives with undermining and frankly purulent drainage. Specimen obtained for culture and debridement of the callused skin and subcutaneous tissue from around the circumference. Because of this I cannot put him back in a total contact cast but to be truthful we are really unfortunately not making a lot of progress I did put in a secure text message to Dr. [name] wondering about the MRSA on the left foot. He suggested vancomycin although this is not started. I was left wondering if he expected me to send this into dialysis. Has we have more purulence on the right side wait for that result before calling dialysis 11/16/2018; I called dialysis earlier this week to get the vancomycin started. I think he got the first dose on Tuesday. The patient tells me that he fell earlier this week twisting his left foot and ankle and he is swelling. He continues to not look well. He had blood cultures done at dialysis on Tuesday he is not been informed of the results. 12/07/2018; the patient was admitted to hospital from 11/22/2018 through 12/02/2018. He underwent a left BKA. Apparently had staph sepsis. In the meantime being off the right foot this is closed over. He follows up with Dr. 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 Subsequent to this he actually had multiple falls in fact he tells me to following which calls the wound to be his apparently. He has been seeing Dr. [name] and his physician assistant in the interim. They actually did want him to come back for reevaluation to see if there's anything we can do for me wound care perspective the help this area to heal more appropriately. The patient  states that he does not have a tremendous amount of pain which is good news. No fevers, chills, nausea, or vomiting noted at this time. We have gotten approval from Dr. [name] office had [name] for [place] to treat the patient for his stop wound. This is due to the fact that the patient was in the 90 day postop global. 1/21; the patient does not have an open wound on the right foot although he does have some pressure areas that will need to be padded when he is transferring. The dehisced surgical wound looks clean although there is some undermining of  1.5 cm superiorly. 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 This appears healthy but there is undermining. TheraSkin #1 2/11; TheraSkin #2 2/25; TheraSkin #3. Wound is measuring smaller 3/10; TheraSkin #4 wound is measuring smaller 3/24; TheraSkin #5 wound is measuring smaller and looks healthy. 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 He has no open area on the right foot although he does have some callused area over the original third metatarsal head wound. The third metatarsal is also subluxed on the right foot. I have warned him today that he cannot consider wearing a prosthesis for at least a month but he can go for measurements. He is going to need to keep the right foot padded is much as possible in his diabetic shoe indefinitely READMISSION 06/12/2019 Mr. Velasquez is a type II diabetic on dialysis. He has been in this clinic multiple times with wounds on his bilateral lower extremities. 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 He also has a history of wounds on his right foot but tells me that everything is going well here. He actually came in here with his prosthesis walking with a cane. We were all really quite gratified to see  this He states over the last several weeks he has had a painful area at the tip of the left third finger. His dialysis shunt in his is in the left upper arm and this particularly hurts during dialysis when his fingers get numb and there is a lot of pain in the tip of his left third finger. I believe he is seen vascular surgery. He had a Doppler done to evaluate for a dialysis steal syndrome. He is going for a banding procedure 1 week from today towards the left AV fistula. I think if that is unsuccessful they will be looking at creating a new shunt. He had a course of doxycycline when he which he finished about a week ago. He has been using Bactroban to the finger 6/25; the patient had his banding procedure and things seem to be going better. He is not having pain in his hand at dialysis. The area on the tip of his finger seems about closed. He did however have a paronychia I the last time I saw him. I have been advising him to use topical Bactroban washing the finger. Culture I did last time showed a few staph epidermidis which I was willing to dismiss as superficial skin contaminant. He arrives today with the finger wound looking better however the paronychia a seems worse 7/9; 2-week follow-up. He does not have an open wound on the tip of his third left finger. I thought he had an initial ischemic wound on the tip of the finger as well as a paronychia a medially. All of this appears to be closed. 8/6-Patient returns after 3 weeks for follow-up and has a right second met head plantar callus with a significant area of maceration hiding an ulcer ABI repeated today on right - 1.26 8/13-Patient returns at 1 week, the right second met head plantar wound appears to be slightly better, it is surrounded by the callus area we are using silver alginate 8/20; the patient came back to clinic 2 weeks ago with a wound on the right second metatarsal head. He apparently  told me he developed callus in this area  but also went away from his custom shoes to a pair of running shoes. Using silver alginate. 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 There might be much we can do to offload this although the patient tells me he has a wheelchair that he is using to try and stay off the wound is much as possible 8/27; right second metatarsal head. Still small punched out area with thick callus and subcutaneous tissue around the wound. We have been using silver collagen. This is always been an issue with this man's wound especially on this foot 9/3; right second met head. Still the same punched-out area with thick callus and subcutaneous tissue around the wound removing the circumference demonstrates repetitively undermining area. I have removed all the subcutaneous tissue associated with this. We have been using silver collagen 9/15; right second metatarsal head. Same punched-out thick callus and subcutaneous tissue around the wound area. Still requiring debridement we have been using silver collagen I changed back to silver alginate X-ray last time showed no evidence of osteomyelitis. Culture showed Klebsiella and he was started and Keflex apparently just 4 days ago 9/24; right second metatarsal head. He is completed the antibiotics I gave him for 10 days. Same punched-out thick callus and subcutaneous tissue around the area. Still requiring debridement. We have been using silver alginate. The area itself looks swollen and this could be just [place] that comes on this area with walking on a prosthesis on the left however I think he needs an MRI and I am going to order that today. There is not an option to offload this further 10/1; right second plantar metatarsal head. MRI booked for October 6. Generally looking better today using silver alginate 10/8; right second plantar metatarsal head. MRI that was done on 10/6 was negative for osteomyelitis noted prior amputations of the second and fourth  toes at the [facility]. Prior amputation of the third toe to the base of the middle phalanx. Prior amputation of the fifth toe to the head of the metatarsal. 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 He does not recall about hearing about this. He did not have any pain but then again he has reduced sensation from diabetic neuropathy 10/15; right second plantar metatarsal head. Still in the same condition callus nonviable subcutaneous tissue which cleans up nicely with debridement but reforms by the next week. The patient tells me that he is offloading this is much as he can including taking his wheelchair to dialysis. We have been using silver alginate, changed to silver collagen today 10/22; right second plantar metatarsal head. Wound measures smaller but still thick callus around this wound. Still requiring debridement 11/5 right second plantar metatarsal head. Less callus around the wound circumference but still requiring debridement. There is still some undermining which I cleaned out the wound looks healthy but still considerable punched out depth with a relatively small circumference to the wound there is no palpable bone no purulent drainage 11/12; right second plantar metatarsal head. Small wound with thick callus tissue around the wound and significant undermining. We have been using silver collagen after my continuous debridements of this area 11/19; patient arrives in today with purulent drainage coming out of the wound in the second third met head area on the right foot. Serosanguineous thick drainage. Specimen obtained for culture. 11/21/2019 on evaluation today patient appears to be doing  well with regard to his foot ulcer. The good news is is culture came back negative for any bacteria there was no growth. The other good news is his x-ray also appears to be doing well. The foot is also measuring much better than what it was. Overall I am very  pleased with how things seem to be progressing. 12/22; patient was in [facility] for several weeks due to the death of a family member. He said he stayed off the wound using silver alginate. 01/03/2020. Patient has a small open area with roughly 0.3 cm of circumferential undermining. The orifice looks smaller. We have been using silver collagen. There is not a wound more aggressive way to offload this area as he has a prosthesis on the other leg 1/21; again the same small area is 2 weeks ago. We have been using silver collagen I change that to endoform today I really believe that this is an offloading issue 2/4; the area on the right third met head. We have been using endoform. 2/11 the area on the right third met head we have been using silver alginate. 2/25; the area on the right third met head. There is much less callus on this today. The patient states he is offloading this even more aggressively. As an example he is taking his wheelchair into dialysis on most days 3/4; right third metatarsal head. Again he has eschar over the surface of the wound which I removed with a #3 curette. Some subcutaneous debris. This has 0.8 cm of direct probing depth. I cannot feel any bone here. I did do a culture the area 3/11; right third metatarsal head. Again an eschared area over the wound which almost makes this look superficial. Again debridement reveals a wound with probing depth probably not much different from last week there is no palpable bone no palpable purulence. The patient tells me that he is making every effort to offload this area properly. He is taking wheelchair into dialysis etc. There is no option for forefoot offloading or a total contact cast. We used Oasis #1 today 3/18; again callus and eschar over the wound surface. I remove this but still a probing wound although only 3 mm in depth this time. This is an improvement 3/25; again callus and eschar over the wound surface which I  removed the wound is much deeper today at 0.7 cm. There is no palpable bone. Because of the appearance of this a culture was done. I am not going to put the Oasis back in this. Concerned about underlying infection. Notable for the fact that he is just finished doxycycline for the last go round of this 3/30; still 0.7 cm in depth which is disappointing. Culture I gave last time showed a few Staph aureus which is methicillin susceptible. This should have been taken care of by the recent course of doxycycline I gave him. We are using silver alginate 4/6; almost 1 cm in depth. We have been using silver alginate with underlying Bactroban. 4/15; about 1 cm in depth still. There is no palpable bone but there is undermining thick surface again as usual. Our intake nurse noted what looked to be purulent drainage I suppose that could have been Bactroban but I did a culture for CandS 4/20; depth down 0.7 cm. Culture from last time was negative we have been using Bactroban and silver alginate 4/29; depth down 0.5 cm. Using silver alginate. He reports some drainage 5/6; comes in with the wound worse. Wound is  deeper and tunnels. Measures 0.9 depth and an undermining area of 1.8 cm. 5/18; there is a change the dressing last week. Still using Sorbact hydrogel. What I can see of the base of the wound looks better. 6/1. Not much change. I changed him to endoform today. He has 1.1 cm in depth no palpable bone 6/8; again a small punched out wound with significant undermining. Culture I did last week was negative. T oday he had some purulent drainage perhaps mixed with some retained endoform. 6/22; MRI report below IMPRESSION: Skin ulceration on the plantar surface of the foot at the level of the head of the second metatarsal. Mild marrow edema in the head of the second metatarsal could be reactive or due to osteomyelitis. Thin fluid collection surrounding the head and neck of the second metatarsal appears to  communicate with the patient's skin wound and is worrisome for a septic collection. Advanced first MTP osteoarthritis. Prior amputations as noted above. Electronically Signed By: Inge Rise M.D. On: 06/16/2020 11:51 PCR culture I did last time I believe showed peptostreptococcus. There was no comment on any other gram-negative or gram-positive organisms. I gave him a course of Flagyl which I think he is finishing up. We are not making any progress with the wound over the right second/third metatarsal head. We are using silver alginate putting him in kerlix Coban 6/29; has his appointment with Dr. [name] of orthopedics next Tuesday I think we see him on the same day. He was in for a nurse visit today. Nurse noted undermining of skin and callus so I was asked to look at this. We have been using silver alginate and kerlix Coban 7/6; patient saw Dr. [name] PA today will see Dr. [name] on Tuesday. The major question is does he needs surgery to clean out the area in question culture question resect any involved bone etc. Otherwise he will need to see infectious disease for broad-spectrum antibiotics given at dialysis 7/13; I talked to Dr. [name] late last week. He was not impressed with the MRI results thinking that the bone changes may have been reactive rather than osteomyelitis. He did not think he needed drainage of an abscess thinking of the fluid we are seeing was superficial and contiguous with the actual wound. He did not think he needed surgery or resection. Recommended consideration of infectious disease and return him to our care. We have been using silver alginate 7/20; PCR culture I did last week showed staph aureus methicillin sensitive. I have him on twice daily Keflex. Silver alginate to the wound 7/27; I have continued his 500 every 12 of cephalexin with a second dose after dialysis. My intention is to give him 4 to 6 weeks of antibiotics directed at [facility] 07/31/2020 on evaluation  today patient appears to be doing a little better in regard to his overall wound size. This is good news. He does have a fairly significant callus buildup still and I am can work on that today. Fortunately there is no signs of active infection at this time. He has been on Keflex and has an extension of that that he is going to pick up and continue taking. 08/07/20-Patient back at 1 week with right met head wound looking about the same, overall the callus removal has helped 8/19; right third met head. Again extensive callus removal with a 10 scalpel. There is a small remaining wound we have been using silver alginate Patient also comes in having a fall and an abrasion on his  left elbow just distally. This is a clean wound and should heal with simple measures 8/26; right third met head. This has a surface on this with some callus however this is as good as I have seen this looking quite some time. He had a fall last week with abrasion in his right forearm this is healed 9/9; right third metatarsal head. At 1 point this was at the probing area over the third metatarsal head to bone. PCR or culture showed methicillin sensitive staph aureus I treated him with prolonged Keflex. Fortunately this has maintained the integrity since he is last here and he is closed Electronic Signature(s) Signed: 09/05/2020 1:37:47 PM By: Linton Ham MD Entered By: Linton Ham on 09/04/2020 13:23:27 -------------------------------------------------------------------------------- Physical Exam Details Patient Name: Date of Service: [name], [name] 09/04/2020 12:30 PM Medical Record Number: [id number] Patient Account Number: [id number] Date of Birth/Sex: Treating RN: November 30, 1951 (69 y.o. [name] Primary Care Provider: [name] Other Clinician: Referring Provider: Treating Provider/Extender: Tanna Savoy in Treatment: 64 Constitutional Sitting or standing Blood Pressure is  within target range for patient.. Pulse regular and within target range for patient.Marland Kitchen Respirations regular, non-labored and within target range.. Temperature is normal and within the target range for the patient.Marland Kitchen Appears in no distress. Cardiovascular Pedal pulses are palpable. Notes Wound exam; right plantar third metatarsal head. This is closed with really healthy looking epithelialization. No evidence of surrounding infection. Electronic Signature(s) Signed: 09/05/2020 1:37:47 PM By: Linton Ham MD Entered By: Linton Ham on 09/04/2020 13:24:32 -------------------------------------------------------------------------------- Physician Orders Details Patient Name: Date of Service: [name], [name] 09/04/2020 12:30 PM Medical Record Number: [id number] Patient Account Number: [id number] Date of Birth/Sex: Treating RN: 1951-07-09 (69 y.o. 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 Skin Barriers/Peri-Wound Care Moisturizing lotion - leg and foot daily Primary Wound Dressing Foam - keep bottom of right foot padded with foam for protection Edema Control Avoid standing for long periods of time Elevate legs to the level of the heart or above for 30 minutes daily and/or when sitting, a frequency of: - throughout the day Electronic Signature(s) Signed: 09/04/2020 5:40:39 PM By:  Deon Pilling Signed: 09/05/2020 1:37:47 PM By: Linton Ham MD Entered By: Deon Pilling on 09/04/2020 13:04:25 -------------------------------------------------------------------------------- Problem List Details Patient Name: Date of Service: [name] Shines B. 09/04/2020 12:30 PM Medical Record Number: [id number] Patient Account Number: [id number] Date of Birth/Sex: Treating RN: December 09, 1951 (70 y.o. 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, other extremity 06/12/2019 No Yes L03.115 Cellulitis of right lower limb 11/15/2019 No Yes S50.312D Abrasion of left elbow, subsequent encounter 08/14/2020 No Yes Inactive  Problems ICD-10 Code Description Active Date Inactive Date L03.114 Cellulitis of left upper limb 06/12/2019 06/12/2019 L98.498 Non-pressure chronic ulcer of skin of other sites with other specified severity 06/12/2019 06/12/2019 S61.209D Unspecified open wound of unspecified finger without damage to nail, subsequent 06/12/2019 06/12/2019 encounter Resolved Problems Electronic Signature(s) Signed: 09/05/2020 1:37:47 PM By: Linton Ham MD Entered By: Linton Ham on 09/04/2020 13:22:33 -------------------------------------------------------------------------------- Progress Note Details Patient Name: Date of Service: [name] Shines B. 09/04/2020 12:30 PM Medical Record Number: [id number] Patient Account Number: [id number] Date of Birth/Sex: Treating RN: [birth year] (69 y.o. [name] Primary Care Provider: [name] Other  Clinician: Referring Provider: Treating Provider/Extender: Tanna Savoy in Treatment: 64 Subjective History of Present Illness (HPI) The following HPI elements were documented for the patient's wound: Location: Patient presents with a wound to bilateral feet. Quality: Patient reports experiencing essentially no pain. Severity: Mildly severe wound with no evidence of infection Duration: Patient has had the wound for greater than 2 weeks prior to presenting for treatment Stomatitis cyst the patient is a pleasant 69 yrs old bm here for evaluation of ulcers on the plantar aspect of both feet. 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 He has a left arm graft for access. He has been trying to stay off his feet for weeks but does not seem to have any improvement in the wound. He has been seeing someone at the foot center and was referred to the Wound Care center for further evaluation. 12/30/15 the patient has 3 wounds one over the right first metatarsal head and 2 on the left foot at the left fifth and lleft first metatarsal head. All of these look relatively similar. The one over the left fifth probe to bone I could not prove that any of the others did. I note his MRI in November that did not show osteomyelitis. His peripheral pulses seem robust. All of these underwent surgical debridement to remove callus nonviable skin and subcutaneous tissue 01/06/16; the patient had his sutures removed from the right fifth ray amputation. There may be a small open part of this superiorly but otherwise the incision looks good. Areas over his right first, left first and left fifth metatarsal head all underwent surgical debridement as a varying degree of callus, skin and nonviable subcutaneous tissue. The area that is most worrisome is the right fifth metatarsal head which has a wound probes precariously close to bone. There is no purulent  drainage or erythema 01/13/16; I'm not exactly sure of the status of the right fifth ray amputation site however he follows with Dr. [name] later this week. The area over his right first plantar metatarsal head, left first and fifth plantar metatarsal head are all in the same status. Thick circumferential callus, nonviable subcutaneous tissue. Culture of the left fifth did not culture last week 01/20/16; all of the patient's wounds appear and roughly the same state although his amputation site on the right lateral foot looks better. His wounds over the right first, left first and left fifth metatarsal heads all underwent difficult surgical debridement removing circumferential callus nonviable skin and subcutaneous tissue. There is no overt evidence of infection in these areas. MRI at the end of December of the left foot did not show osteomyelitis, right foot showed osteomyelitis of the right fifth digit he is now status post amputation. 01/27/16 the patient's wounds over his plantar first and fifth metatarsal heads on the left all  appear better having been started on a total contact cast last week. They were debridement of circumferential callus and nonviable subcutaneous tissue as was the wound over the first metatarsal head. His surgical incision on the right also had some light surface debridement done. 03/23/2016 -- the patient was doing really well and most of his wounds had almost completely healed but now he came back today with a history of having a discharge from the area of his right foot on the plantar aspect and also between his first and second toe. He also has had some discharge from the left foot. Addendum: I spoke to the PA [name] at the dialysis center whose fax number is [phone]. We discussed the infection the patient has and she will put the patient on vancomycin and Fortaz until the final culture report is back. We will fax this as soon as available. 03/30/2016 --  left foot x-ray IMPRESSION:No definitive osteomyelitis noted. X-ray of the right foot -- IMPRESSION: 1. Soft tissue swelling. Prior amputation right fifth digit. No acute or focal bony abnormality identified. If osteomyelitis remains a clinical concern, MRI can be obtained. 2. Peripheral vascular disease. His culture reports have grown an MSSA -- and we will fax this report to his hemodialysis center. He was called in a prescription of oral doxycycline but I have told her not to fill this in as he is already on IV antibiotics. 04/06/2016 -- a few days ago, I spoke to the hemodialysis center nurse who had stopped the IV antibiotics and he was given a prescription for doxycycline 100 mg by mouth twice a day for a week and he is on this at the present time. 05/11/2016 -- he has recently seen his PCP this week and his hemoglobin A1c was 7. He is working on his paperwork to get his orthotic shoes. 05/25/2016 -- -- x-ray of the left foot IMPRESSION:No acute bony abnormality. No radiographic changes of acute osteomyelitis. No change since prior study. X-ray of the right foot -- IMPRESSION: Postsurgical changes are seen involving the fifth toe. No evidence of acute osteomyelitis. he developed a large blister on the medial part of his right foot and this opened out and drain fluid. 06/01/2016 -- he has his MRI to be done this afternoon. 06/15/2016 -- MRI of the left forefoot without contrast shows 2 separate regions of cutaneous and subcutaneous edema and possible ulceration and blistering along the ball of the foot. No obvious osteomyelitis identified. MRI of the right foot showed cutaneous and subcutaneous thickening plantar to the first digit sesamoid with an ulcer crater but no underlying osteomyelitis is identified. 06/22/2016 -- the right foot plantar ulcer has been draining a lot of seropurulent material for the last few days. 06/30/2016 -- spoke to the dialysis center and I believe I spoke to [name]  a PA at the center who discussed with me and agreed to putting [name] on vancomycin until his cultures arrive. On review of his culture report no WBCs were seen or no organisms were seen and the culture was reincubated for better growth. The final report is back and there were no predominant growth including Streptococcus or Staphylococcus. Clinically though he has a lot of drainage from both wounds a lot of undermining and there is further blebs on the left foot towards the interspace between his first and second toe. 08/10/2016 -- the culture from the right foot showed normal skin flora and there was no Staphylococcus aureus was group A streptococcus isolated. 09/21/2016 -- --  MRI of the right foot was done on 09/13/2016 - IMPRESSION: Findings most consistent with acute osteomyelitis throughout the great toe, sesamoid bones and plantar aspect of the head of the first metatarsal. Fluid in the sheath of the flexor tendon of the great toe could be sympathetic but is worrisome for septic tenosynovitis. First MTP joint effusion worrisome for septic joint. He was admitted to the hospital on 09/11/2016 and treated for a fever with vancomycin and cefepime. He was seen by Dr. Bobby Rumpf of infectious disease who recommended 6 weeks treatment with vancomycin and ceftazidime with his hemodialysis and to continue to see as in the wound clinic. Vascular consult was pending. The patient was discharged home on 09/14/2016 and he would continue with IV antibiotics for 6 weeks. 09/28/16 wound appears reasonably healthy. Continuing with total contact cast 10/05/16 wound is smaller and looking healthy. Continue with total contact cast. He continues on IV antibiotics 10/12/2016 -- he has developed a new wound on the dorsal aspect of his left big toe and this is a superficial injury with no surrounding cellulitis. He has completed 30 days of IV antibiotics and is now ready to start his hyperbaric oxygen therapy  as per his insurance company's recommendation. 10/19/2016 -- after the cast was removed on the right side he has got good resolution of his ulceration on the right plantar foot. he has a new wound on the left plantar foot in the region of his fourth metatarsal and this will need sharp debridement. 10/26/2016 -- Xray of the right foot complete: IMPRESSION: Changes consistent with osteomyelitis involving the head of the first metatarsal and base of the first proximal phalanx. The sesamoid bones are also likely involved given their positioning. 11/02/2016 -- there still awaiting insurance clearance for his hyperbaric oxygen therapy and hopefully he will begin treatment soon. 11/09/2016 -- started with hyperbaric oxygen therapy and had some barotrauma to the right ear and this was seen by ENT who was prescribed Afrin drops and would probably continue with HBO and he is scheduled for myringotomy tubes on Friday 11/16/2016 -- he had his myringotomy tubes placed on Friday and has been doing much better after that with some fluid draining out after hyperbaric treatment today. The pain was much better. 11/30/2016 -- over the last 2 days he noticed a swelling and change of color of his right second toe and this had been draining minimal fluid. 12/07/2016 -- x-ray of the right foot -- IMPRESSION:1. Progressive ulceration at the distal aspect of the second digit with significant soft tissue swelling and osseous changes in the distal phalanx compatible with osteomyelitis. 2. Chronic osteomyelitis at the first MTP joint. 3. Ulcerations at the second and third toes as well without definite osseous changes. Osteomyelitis is not excluded. 4. Fifth digit amputation. On 12/01/2016 oo I spoke to Dr. Bobby Rumpf, the infectious disease specialist, who kindly agreed to treat this with IV antibiotics and he would call in the order to the dialysis center and this has been discussed in detail with the patient who will  make the appropriate arrangements. The patient will also book an appointment as soon as possible to see Dr. [name] in the office. Of note the patient has not been on antibiotics this entire week as the dialysis center did not receive any orders from Dr. [name]. I got in touch with Dr. [name] who tells me the patient has an appointment to see him this coming Wednesday. 12/14/2016 -- the patient is on ceftazidime and vancomycin  during his dialysis, and I understand this was put on by his nephrologist Dr. Raliegh Ip possibly after speaking with infectious disease Dr. [name] 12/23/16; patient was on my schedule today for a wound evaluation as he had difficulties with his schedule earlier this week. I note that he is on vancomycin and ceftazidine at dialysis. In spite of this he arrives today with a new wound on the base of the right second toe this easily probes to bone. The known wound at the tip of the second toe With this area. He also has a superficial area on the medial aspect of the third toe on the side of the DIP. This does not appear to have much depth. The area on the plantar left foot is a deep area but did not probe the bone 12/28/16 -- he has brought in some lab work and the most recent labs done showed a hemoglobin of 12.1 hematocrit of 36.3, neutrophils of 51% WBC count of 5.6, BN of 53, albumin of 4.1 globulin of 3.7, vancomycin 13 g per mL. 01/04/2017 -- he saw Dr. [name] who has recommended a amputation of the right second toe and he is awaiting this date. He sees Dr. Bobby Rumpf of infectious disease tomorrow. The bone culture taken on 12/28/2016 had no growth in 2 days. 01/11/2017 -- was seen by Dr. Bobby Rumpf regarding the management and has recommended a eval by vascular surgeons. He recommended to continue the antibiotics during his hemodialysis. Xray of the left foot -- IMPRESSION: No acute fracture, dislocation, or osseous erosion identified. 01/18/2017 -- he has his  vascular workup later today and he is going to have his right foot second toe amputation this coming Friday by Dr. [name]. We have also put in for a another 30 treatments with hyperbaric oxygen therapy 01/25/2017 -- I have reviewed Dr. Donnetta Hutching is vascular report from last week where he reviewed him and thought his vascular function was good enough to heal his amputation site and no further tests were recommended. He also had his orthopedic related to surgery which is still pending the notes and we will review these next week. 02/01/2017 -- the operative remote of Dr. 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 02/08/2017 --he was seen by Dr. [name] on 02/03/2017 -- patient is on [place]. after a thorough review he had recommended to continue with antibiotics during hemodialysis. The patient was seen by Dr. [name], but I do not find any follow-up note on the electronic medical record. he has removed the dressing over the right foot to remove the sutures and asked him to see him back in a month's time 02/22/2017 -- patient has been febrile and has been having symptoms of the upper respiratory tract infection but has not been checked for the flu and it's been over 4 days now. He says he is feeling better today. Some drainage between his left first and second toe and this needed to be looked at 03/08/2017 -- was seen by Dr. Bobby Rumpf on 03/07/2017 after review he will stop his antibiotics and see him back in a month to see if he is feeling well. 03/15/2017 -- he has completed his course  of hyperbaric oxygen therapy and is doing fine with  his health otherwise. 03/22/2017 -- his nutritionist at the dialysis center has recommended a protein supplement to help build his collagen and we will prescribe this for him when he has the details 05/03/2017 -- he recently noticed the area on the plantar aspect of his fifth metatarsal head which opened out and had minimal drainage. 05/10/2017 -- -- x-ray of the left foot -- IMPRESSION:1. No convincing conventional radiographic evidence of active osteomyelitis.2. Active soft tissue ulceration at the tip of the fifth digit, and at the lateral aspect of the foot adjacent the base of the fifth metatarsal. 3. Surgical changes of prior fourth toe amputation. 4. 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 5. Small vessel atherosclerotic vascular calcifications. 6. 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 ======= Readmission after 5 weeks: 06/15/2017 -- the patient returns after 5 weeks having had an MRI done on 05/17/2017 MRI of the left foot without contrast showed soft tissue ulcer overlying the base of the fifth metatarsal. 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 No drainable fluid collection to suggest an abscess. He was in hospital between 05/16/2017 and 05/25/2017 -- and on discharge was asked to follow-up with Dr. [name] and Dr. [name]. He was treated 2 weeks post discharge with vancomycin plus ceftriaxone with hemodialysis and oral metronidazole 500 mg 3 times a day. 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 last hemoglobin A1c was 6.7% He had a postoperative MR of the foot on 05/23/2017 -- which showed no new areas of cortical bone loss, edema or soft tissue ulceration to suggest osteomyelitis. the patient has completed his course of IV antibiotics during dialysis and is to see the  infectious disease doctor tomorrow. Dr. [name] had seen him after suture removal and asked him to keep the wound with a dry dressing 06/21/2017 -- he was seen by Dr. Bobby Rumpf of infectious disease on 06/16/2017 -- he stopped his Flagyl and will see him back in 6 weeks. He was asked to follow-up with Dr. [name] and with the wound clinic clinic. 07/05/17; bone biopsy from last time showed acute osteomyelitis. This is from the reminiscent in the left fifth metatarsal. Culture result is apparently still pending [holding for anaerobe}. From my understanding in this case this is been a progressive necrotic wound which is deteriorated markedly over the last 3 weeks since he returned here. He now has a large area of exposed bone which was biopsied and cultured last week. Dr. Graylon Good has put him on vancomycin and Fortaz during his hemodialysis and Flagyl orally. He is to see Dr. [name] next week 07/19/2017 -- the patient was reviewed by Dr. [name] of orthopedics who reviewed the case in detail and agreed with the plan to continue with IV antibiotics, aggressive wound care and hyperbaric oxygen therapy. He would see him back in 3 weeks' time 08/09/2017 -- saw Dr. Bobby Rumpf on 08/03/2017 -he was restarted on his antibiotics for 6 weeks which included vanco, ceftaz and Flagyl. he recommended continuing his antibiotics for 6 weeks and reevaluate his completion date. He would continue with wound care and hyperbaric oxygen therapy. 08/16/2017 -- I understand he will be completing his 6 weeks of antibiotics sometime later this week. 09/19/2017 -- he has been without his wound VAC for the last week  due to lack of supplies. He has been packing his wound with silver alginate. 10/04/2017 -- he has an appointment with Dr. [name] tomorrow and hence we will not apply the wound VAC after his dressing changes today. 10/11/2017 -- he was seen by Dr. Bobby Rumpf on 10/05/2017, noted that the patient was  currently off antibiotics, and after thorough review he recommended he follow-up with the wound care as per plan and no further antibiotics at this point. 11/29/17; patient is arrived for the wound on his left lateral foot.Marland Kitchen He is completed IV antibiotics and 2 rounds of hyperbaric oxygen for treatment of underlying osteomyelitis. He arrives today with a surface on most of the wound area however our intake nurse noted drainage from the superior aspect. This was brought to my attention. 12/13/2017 -- the right plantar foot had a large bleb and once the bleb was opened out a large callus and subcutaneous debris was removed and he has a plantar ulcer near the fourth metatarsal head 12/28/17 on evaluation today patient appears to be doing acutely worse in regard to his left foot. The wound which has been appearing to do better as now open up more deeply there is bone palpable at the base of the wound unfortunately. He tells me that this feels "like it did when he had osteomyelitis previously" he also noted that his second toe on the left foot appears to be doing worse and is swollen there does appear to be some fluid collected underneath. His right foot plantar ulcer appears to be doing somewhat better at this point and there really is no complication at the site currently. No fevers, chills, nausea, or vomiting noted at this time. Patient states that he normally has no pain at this site however. T oday he is having significant pain. 01/03/18; culture done last week showed methicillin sensitive staph aureus and group B strep. He was on [place] however he arrives today with a fever of 101. He has a functional dialysis shunt in his left arm but has had no pain here. No cough. He still makes some urine no dysuria he did have abdominal pain nausea and diarrhea over the weekend but is not had any diarrhea since yesterday. Otherwise he has no specific complaints 01/05/18; the patient returns today in follow-up for his  presentation from 1/8. At that point he was febrile. I gave him some Levaquin adjusted for his dialysis status. He tells me the fever broke that night and it is not likely that this was actually a wound infection. He is gone on to have an MRI of the left foot; this showed interval development of an abnormal signal from the base of the third and fourth metatarsal and in the cuboid reminiscent consistent with osteomyelitis. There is no mention of the left second toe I think which was a concern when it was ordered. The patient is taking his Levaquin 01/10/18; the patient has completed his antibiotics today. The area on the left lateral foot is smaller but it still probes easily to bone. He has underlying osteomyelitis here and sees Dr. Megan Salon of infectious disease this week ooThe area on the right third plantar metatarsal head still requires debridement not much change in dimensions [facility] has a new wound on the medial tip of his right third toe again this probes to bone. He only noticed this 2 days ago 01/17/18; the patient saw Dr. [name] last week and he is back on Vanco and Fortaz at dialysis. This is related  to the osteomyelitis in the base of his left lateral foot which I think is the bases of his third and fourth metatarsals and cuboid remnant. We are previously seeing him for a wound also on the base of the fourth med head on the right. X-ray of this area did not show osteomyelitis in relation to a new wound on the tip of his right third toe. Again this probes to bone. Finally his second toe on the left which didn't really show anything on the MRI at least no report appears to have a separated cutaneous area which I think is going to come right off the tip of his toe and leave exposed bone. Whether this is infectious or ischemic I am not clear Culture I did from the third toe last week which was his new wound was negative. Plain x-ray on the right foot did not show osteomyelitis in the  toes 01/24/18; the patient is going went to see Dr. [name]. He is going to have a amputation of the left second and right third toe although paradoxically the left second toe looks better than last week. We have treating a deep probing wound on the left lateral foot and the area over the third metatarsal head on the right foot. 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 Also a debridement including bone of the left lateral foot and a closure which is still sutured. This is a bit surprising. Also apparent debridement of the base of the right third toe wound. 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 I'm not sure exactly where we are in the frame of things here. The wound on the left lateral foot is still sutured also the amputation sites of the left second and right third toe. He is been using Xeroform to the sutured areas silver alginate to the right foot 02/14/18; he continues on Rocephin at dialysis for osteomyelitis. I still don't have a good sense of where we are in the treatment duration. The wounds on the left lateral foot had the sutures removed and this clearly still probes deeply. We debrided the area with a #5 curet. We'll use silver alginate to the wound oothe area over the right third met head also required debridement of callus skin and subcutaneous tissue and necrotic debris over the wound surface. This tunnels superiorly but I did not unroofed this today. ooThe areas on the tip of his toes have a crusted surface eschar I did not debridement this either. 02/21/18; he continues on Rocephin at dialysis for osteomyelitis. I don't have a good sense of where we are and the treatment duration. The wounds on the left lateral foot is callused over on presentation but  requires debridement. The area on the right third med head plantar aspect actually is measuring smaller 02/28/18;patient is on Vanco and Fortaz at dialysis, I'm not sure why I thought this was Rocephin above unless it is been changed. I don't have a good sense of time frame here. Using silver collagen to the area on the left lateral foot and right third med head plantar foot 03/07/18; patient is completing bank and Fortaz at dialysis soon. He is using Silver collagen to the area on the left lateral foot and the right third metatarsal head. He  has a smaller superficial wound distal to the left lateral foot wound. 03/14/18-he is here in follow-up evaluation for multiple ulcerations to his bilateral feet. He presents with a new ulceration to the plantar aspect of the left foot underneath the blister, this was deroofed to reveal a partial thickness ulcer. He is voicing no complaints or concerns, tolerated dialysis yesterday. We will continue with same treatment plan and he will follow-up next week 03/21/18; the patient has a area on the lateral left foot which still has a small probing area. The overall surface area of the wound is better. He presented with a new ulceration on the left foot plantar fifth metatarsal head last week. The area on the right third metatarsal head appears smaller.using silver alginate to all wound areas. The patient is changing the dressing himself. He is using a [place] forefoot offloading are on the right and a healing sandal on the left 03/28/18; original wound left lateral foot. Still nowhere close to looking like a heeling surface oosecondary wound on the left lateral foot at the base of his question fifth metatarsal which was new last week ooRight third metatarsal head. ooWe have been using silver alginate all wounds 04/04/18; the original wound on the left lateral foot again heavy callus surrounding thick nonviable subcutaneous tissue all requiring debridement. The new  wound from 2 weeks ago just near this at the base of the fifth metatarsal head still looks about the same ooUnfortunately there is been deterioration in the third medical head wound on the right which now probes to bone. I must say this was a superficial wound at one point in time and I really don't have a good frame of reference year. I'll have to go back to look through records about what we know about the right foot. 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 He is also been to see Dr. [name]. Somewhere in here as somebody is ordered a VASCULAR evaluation which was done on 03/22/18. On the right is anterior tibial artery was monophasic triphasic at the posterior tibial artery. On the left anterior tibial artery monophasic posterior tibial artery monophasic. ABI in the right was 1.43 on the left 1.21. TBIs on the right at 0.41 and on the left at 0.32. A vascular consult was recommended and I think has been arranged. 04/11/18; Mr. Hiltner has not had an MRI of the right foot since 2017. Recent x-ray of the right foot done in January and February was negative. However he has had a major deterioration in the wound over the third met head. He is completed antibiotics last week at dialysis [place]. I think he is going to see vascular in follow-up. The areas on the left lateral foot and left plantar fifth metatarsal head both look satisfactory. I debrided both of these areas although the tissue here looks good. The area on the left lateral foot once probe to bone it certainly does not do that now 04/18/18; MRI of the right foot is on Thursday. He has what looks to be serosanguineous purulent drainage coming out of the wound over the right third metatarsal head today. He completed antibiotics bank and Fortaz 2 weeks ago at dialysis. He has a vascular follow-up with regards to his arterial  insufficiency although I don't exactly see when that is booked. The areas on the left foot including the lateral left foot and the plantar left fifth  metatarsal head look about the same. 04/25/18-He is here in follow-up evaluation for bilateral foot ulcers. MRI obtained was negative for osteomyelitis. Wound culture was negative. We will continue with same treatment plan he'll follow-up next week 05/02/18; the right plantar foot wound over the third metatarsal head actually looks better than when I last saw this. His MRI was negative for osteomyelitis wound culture was negative. ooOn the left plantar foot both wounds on the plantar fifth metatarsal head and on the lateral foot both are covered in a very hard circumferential callus. 05/09/18; right plantar foot wound over the third metatarsal head stable from last week. ooLeft plantar foot wound over the fifth metatarsal head also stable but with callus around both wound areas ooThe area on the left lateral foot had thick callus over a surface and I had some thoughts about leaving this intact however it felt boggy. ooWe've been using silver alginate all wounds 05/16/18; since the patient was last here he was hospitalized from 5/15 through 5/19. 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 This grew MRSA. He was placed on vancomycin.MRI of the left foot suggested "progressive" osteomyelitis and bone destruction of the cuboid and the base of the fifth metatarsal. Stable erosive changes of the base of the second metatarsal. I'm wondering if they're aware that he had surgery and debridement in the area of the underlying bone previously by Dr. [name]. In any case, He was also revascularized with an angioplasty and stenting of the left tibial peroneal trunk and angioplasty of the left posterior tibial artery. He is on vancomycin at dialysis. He has a 2 week  follow-up with infectious disease. He has vascular surgery follow-up. He was started on Plavix. 05/23/18; the patient's wound on the lateral left foot at the level of the fifth metatarsal head and the plantar wound on the plantar fifth metatarsal head both look better. The area on the right third metatarsal head still has depth and undermining. We've been using silver alginate. He is on vancomycin. He has been revascularized on the left 05/30/18; he continues on vancomycin at dialysis. Revascularized on the left. The area on the left lateral foot is just about closed. Unfortunately the area over the plantar fifth metatarsal head undermining superiorly as does the area over the right third metatarsal head. Both of these significantly deteriorated from last week. He has appointments next week with infectious disease and orthopedics I delayed putting on a cast on the left until those appointments which therefore we made we'll bring him in on Friday the 14th with the idea of a cast on the left foot 06/06/18; he continues on vancomycin at dialysis. Revascularized on the left. He arrives today with a ballotable swelling just above the area on the left lateral foot where his previous wound was. He has no pain but he is reasonably insensate. The difficult areas on the plantar left fifth metatarsal head looks stable whereas the area on the right third plantar metatarsal head about the same as last week there is undermining here although I did not unroofed this today. He required a considerable debridement of the swelling on the left lateral foot area and I unroofed an abscess with a copious amount of brown purulent material which I obtained for culture. I don't believe during his recent hospitalization he had any further imaging although I need to review this. Previous cultures from this  area done in this clinic showed MRSA, I would be surprised if this is not what this is currently even though he is on  vancomycin. 06/13/18; he continues on vancomycin at dialysis however he finishes this on Sunday and then graduates the doxycycline previously prescribed by Dr. [name]. The abscess site on the lateral foot that I unroofed last time grew moderate amounts of methicillin-resistant staph aureus that is both vancomycin and tetracycline sensitive so we should be okay from that regard. He arrives today in clinic with a connection between the abscess site and the area on the lateral foot. We've been using silver alginate to all his wound areas. The right third metatarsal head wound is measuring smaller. Using silver alginate on both wound areas 06/20/18; transitioning to doxycycline prescribed by Dr. [name]. The abscess site on the lateral foot that I unroofed 2 weeks ago grew MRSA. That area has largely closed down although the lateral part of his wound on the fifth metatarsal head still probes to bone. I suspect all of this was connected. ooOn the right third metatarsal head smaller looking wound but surrounded by nonviable tissue that once again requires debridement using silver alginate on both areas 06/27/18; on doxycycline 100 twice a day. The abscess on the lateral foot has closed down. Although he still has the wound on the left fifth metatarsal head that extends towards the lateral part of the foot. The most lateral part of this wound probes to bone ooRight third metatarsal head still a deep probing wound with undermining. Nevertheless I elected not to debridement this this week ooIf everything is copious static next week on the left I'm going to attempt a total contact cast 07/04/18; he is tolerating his doxycycline. The abscess on the left lateral foot is closed down although there is still a deep wound here that probes to bone. We will use silver collagen under the total contact cast Silver collagen to the deep wound over the right third metatarsal head is well 07/11/18; he is tolerating  doxycycline. The left lateral fifth plantar metatarsal head still probes deeply but I could not probe any bone. We've been using silver collagen and this will be the second week under the total contact cast ooSilver collagen to deep wound over the right third metatarsal head as well 07/18/18; he is still taking doxycycline. The left lateral fifth plantar metatarsal this week probes deeply with a large amount of exposed bone. Quite a deterioration. He required extensive debridement. Specimens of bone for pathology and CNS obtained ooAlso considerable debridement on the right third metatarsal head 07/25/18; bone for pathology last week from the lateral left foot showed osteomyelitis. CandS of the bone Enterobacter. And Klebsiella. He is now on ciprofloxacin and the doxycycline is stopped [Dr. [name] of infectious disease] area He has an appointment with Dr. [name] tomorrow I'm going to leave the total contact cast off for this week. May wish to try to reapply that next week or the week after depending on the wound bed looks. I'm not sure if there is an operative option here, previously is followed with Dr. [name] 08/01/18 on evaluation today patient presents for reevaluation. He has been seen by infectious disease, Dr. [name], and he has placed the patient back on Ceftaz currently to be given to him at dialysis three times a week. 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 Fortunately there does not appear to be the evidence  of infection at this point in time. Overall the patient has been tolerating the dressing changes without complication. We are no longer due lives in the cast of left foot secondary to the infection obviously. 08/10/18 on evaluation today patient appears to be doing rather well all things considered in regard to his left plantar foot ulcer. He does have a significant callous on the lateral portion of his left foot  he wonders if I can help clean that away to some degree today. Fortunately he is having no evidence of infection at this time which is good news. No fevers chills noted. With that being said his right plantar foot actually does have a significant callous buildup around the wound opening this seems to not be doing as well as I would like at this point. I do believe that he would benefit from sharp debridement at this site. Subsequently I think a total contact cast would be helpful for the right foot as well. 08/17/18; the patient arrived today with a total contact cast on the right foot. This actually looks quite good. He had gone to see Dr. Megan Salon of infectious disease about the osteomyelitis on the left foot. I think he is on IV Fortaz at dialysis although I am not exactly sure of the rationale for the Fortaz at the time of this dictation. He also arrived in today with a swelling on the lateral left foot this is the site of his original wounds in fact when I first saw this man when he was under Dr. Ardeen [name] care I think this was the site of where the wound was located. It was not particularly tender however using a small scalpel I opened it to [name] some moderate amount of purulent drainage. 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 Last MRI was in May that showed progressive osteomyelitis and bone destruction of the cuboid and base of the fifth metatarsal. Last week he had an abscess over this same area this was removed. Culture was negative. ooWe are continuing with a total contact cast to the area on the third metatarsal head on the right and making some good progress here. The patient asked me again about renal transplant. He is not on the list because of the open wounds. 08/31/18; apparently the patient had an interruption in the [place] but he is now back on this at  dialysis. Apparently there was a misunderstanding and Dr. Crissie Figures orders. Due to have the MRI of the left foot tonight. ooThe area on the right foot continues to have callus thick skin and subcutaneous tissue around the wound edge that requires constant debridement however the wound is smaller we are using a total contact cast in this area. Alginate all wounds 09/07/18; without much surprise the MRI of his left foot showed osteomyelitis in the reminiscent of his fourth metatarsal but also the third metatarsal. She arrives in clinic today with the right foot and a total contact cast. There was purulent drainage coming out of the wound which I have cultured. Marked deterioration here with undermining widely around the wound orifice. Using pickups and a scalpel I remove callus and subcutaneous tissue from a substantial new opening. ooIn a similar fashion the area on the left lateral foot that was blistered last week I have opened this and remove skin and subcutaneous tissue from this area to expose a obvious new  wound. I think there is extension and communication between all of this on the left foot. ooThe patient is on Fortaz at dialysis. Culture done of the right foot. We are clearly not making progress here. ooWe have made him an appointment with Dr. [name] of orthopedics. I think an amputation of the left leg may be discussed. I don't think there is anything that can be done with foot salvage. 09/14/18; culture from the right foot last time showed a very resistant MRSA. This is resistant to doxycycline. I'm going to try to get linezolid at least for a week. He does not have a appointment with Dr. [name] yet. 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 I'll send a message to Dr. [name] of infectious disease. I want the patient to see Dr. [name] to go over the pros and cons of an amputation which I think will be a BKA. The patient had a  question about whether this is curative or not. I told him that I thought it would be although spread of staph aureus infection is not unheard of. MRI of the right foot was done at the end of April 2019 did not show osteomyelitis. The patient last saw Dr. [name] on 9/3. I'll send Dr. [name] a message. I would really like him to weigh the pros and cons of a BKA on the left otherwise he'll probably need IV antibiotics and perhaps hyperbaric oxygen again. He has not had a good response to this in the past. His MRI earlier this month showed progressive damage in the remnants of the fourth and third metatarsal 09/21/2018; sees Dr. [name] of orthopedics next week to discuss a left BKA in response to the osteomyelitis in the left foot. Using silver alginate to all wound areas. He is completing the Zyvox I did put in for him after last culture showed MRSA 09/29/2018; patient saw Dr. [name] of orthopedics discussed the osteomyelitis in the left foot third and fourth metatarsals. He did not recommend urgent surgery but certainly stated the only surgical option would be a BKA. He is communicating with Dr. [name]. The problem here is the instability of the areas on his foot which constantly generate draining abscesses. I will communicate with Dr. [name] about this. He has an appointment on 10/23 10/05/2018; patient sees Dr. [name] on 10/23. I have sent him a secure message not ordered any additional antibiotics for now. Patient continues to have 2 open areas on the left plantar foot at the fifth metatarsal head and the lateral aspect of the foot. These have not changed all that much. The area on the right third met head still has thick callus and surrounding subcutaneous tissue we have been using silver alginate 10/12/2018; patient sees Dr. [name] or colleague on 10/23. 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 The area on the right plantar foot  is just about closed. The superior wound on the left fifth metatarsal head is callused over but I am not sure if this is closed. The area below it is about the same. Considerable amount of callus on the lateral foot. We have been using silver alginate to the wounds 10/19/2018; the patient saw Dr. [name] on 10/22. He wishes to try and continue to save the left foot. He has been given 6 weeks of oral ciprofloxacin. We continue to put a total contact cast on the right  foot. The area over the fifth metatarsal head on the left is callused over/perhaps healed but I did not remove the callus to find out. He still has the wound on the left lateral midfoot requiring debridement. We have been using silver alginate to all wound areas 10/26/2018 ooOn the left foot our intake nurse noted some purulent drainage from the inferior wound [currently the only one that is not callused over] ooOn the right foot even though he is in total contact cast a considerable amount of thick black callus and surface eschar. On this side we have been using silver alginate under a total contact cast. oohe remains on ciprofloxacin as prescribed by Dr. [name] 11/02/2018; culture from last week which is done of the probing area on the left midfoot wound showed MRSA "few". I am going to need to contact Dr. [name] which I will do today I think he is going to need IV antibiotics again. The area on the left foot which was callused on the side is clearly separating today all of this was removed a copious amounts of callus and necrotic subcutaneous tissue. ooThe area on the right plantar foot actually remained healthy looking with a healthy granulated base. ooWe are using silver alginate to all wound areas 11/09/2018; unfortunately neither 1 of the patient's wounds areas looks at all satisfactory. On the left he has considerable necrotic debris from the plantar wound laterally over the foot. I removed copious amounts of material  including callus skin and subcutaneous tissue. On the right foot he arrives with undermining and frankly purulent drainage. Specimen obtained for culture and debridement of the callused skin and subcutaneous tissue from around the circumference. Because of this I cannot put him back in a total contact cast but to be truthful we are really unfortunately not making a lot of progress I did put in a secure text message to Dr. [name] wondering about the MRSA on the left foot. He suggested vancomycin although this is not started. I was left wondering if he expected me to send this into dialysis. Has we have more purulence on the right side wait for that result before calling dialysis 11/16/2018; I called dialysis earlier this week to get the vancomycin started. I think he got the first dose on Tuesday. The patient tells me that he fell earlier this week twisting his left foot and ankle and he is swelling. He continues to not look well. He had blood cultures done at dialysis on Tuesday he is not been informed of the results. 12/07/2018; the patient was admitted to hospital from 11/22/2018 through 12/02/2018. He underwent a left BKA. Apparently had staph sepsis. In the meantime being off the right foot this is closed over. He follows up with Dr. 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 Subsequent to this he actually had multiple falls in fact he tells me to following which calls the wound to be his apparently. He has been seeing Dr. [name] and his physician assistant in the interim. They actually did want him to come back for reevaluation to see if there's anything we can do for me wound care perspective the help this area to heal more appropriately. The patient states that he does not have a tremendous amount of pain which is good news. No fevers, chills, nausea, or vomiting noted at this time.  We have gotten approval from Dr. [name] office had [facility]  orthopedics for [place] to treat the patient for his stop wound. This is due to the fact that the patient was in the 90 day postop global. 1/21; the patient does not have an open wound on the right foot although he does have some pressure areas that will need to be padded when he is transferring. The dehisced surgical wound looks clean although there is some undermining of 1.5 cm superiorly. 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 This appears healthy but there is undermining. TheraSkin #1 2/11; TheraSkin #2 2/25; TheraSkin #3. Wound is measuring smaller 3/10; TheraSkin #4 wound is measuring smaller 3/24; TheraSkin #5 wound is measuring smaller and looks healthy. 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 He has no open area on the right foot although he does have some callused area over the original third metatarsal head wound. The third metatarsal is also subluxed on the right foot. I have warned him today that he cannot consider wearing a prosthesis for at least a month but he can go for measurements. He is going to need to keep the right foot padded is much as possible in his diabetic shoe indefinitely READMISSION 06/12/2019 [name] is a type II diabetic on dialysis. He has been in this clinic multiple times with wounds on his bilateral lower extremities. 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 He also has a history of wounds on his right foot but tells me that everything is going well here. He actually came in here with his prosthesis walking with a cane. We were all really quite gratified to see this He states over the last several weeks he has had a painful area at the tip of the left third finger. His dialysis shunt in his is in  the left upper arm and this particularly hurts during dialysis when his fingers get numb and there is a lot of pain in the tip of his left third finger. I believe he is seen vascular surgery. He had a Doppler done to evaluate for a dialysis steal syndrome. He is going for a banding procedure 1 week from today towards the left AV fistula. I think if that is unsuccessful they will be looking at creating a new shunt. He had a course of doxycycline when he which he finished about a week ago. He has been using Bactroban to the finger 6/25; the patient had his banding procedure and things seem to be going better. He is not having pain in his hand at dialysis. The area on the tip of his finger seems about closed. He did however have a paronychia I the last time I saw him. I have been advising him to use topical Bactroban washing the finger. Culture I did last time showed a few staph epidermidis which I was willing to dismiss as superficial skin contaminant. He arrives today with the finger wound looking better however the paronychia a seems worse 7/9; 2-week follow-up. He does not have an open wound on the tip of his third left finger. I thought he had an initial ischemic wound on the tip of the finger as well as a paronychia a medially. All of this appears to be closed. 8/6-Patient returns after 3 weeks for follow-up and has a right second met  head plantar callus with a significant area of maceration hiding an ulcer ABI repeated today on right - 1.26 8/13-Patient returns at 1 week, the right second met head plantar wound appears to be slightly better, it is surrounded by the callus area we are using silver alginate 8/20; the patient came back to clinic 2 weeks ago with a wound on the right second metatarsal head. He apparently told me he developed callus in this area but also went away from his custom shoes to a pair of running shoes. Using silver alginate. He of course has the left BKA and prosthesis on  the left side. There might be much we can do to offload this although the patient tells me he has a wheelchair that he is using to try and stay off the wound is much as possible 8/27; right second metatarsal head. Still small punched out area with thick callus and subcutaneous tissue around the wound. We have been using silver collagen. This is always been an issue with this man's wound especially on this foot 9/3; right second met head. Still the same punched-out area with thick callus and subcutaneous tissue around the wound removing the circumference demonstrates repetitively undermining area. I have removed all the subcutaneous tissue associated with this. We have been using silver collagen 9/15; right second metatarsal head. Same punched-out thick callus and subcutaneous tissue around the wound area. Still requiring debridement we have been using silver collagen I changed back to silver alginate X-ray last time showed no evidence of osteomyelitis. Culture showed Klebsiella and he was started and Keflex apparently just 4 days ago 9/24; right second metatarsal head. He is completed the antibiotics I gave him for 10 days. Same punched-out thick callus and subcutaneous tissue around the area. Still requiring debridement. We have been using silver alginate. The area itself looks swollen and this could be just [place] that comes on this area with walking on a prosthesis on the left however I think he needs an MRI and I am going to order that today. There is not an option to offload this further 10/1; right second plantar metatarsal head. MRI booked for October 6. Generally looking better today using silver alginate 10/8; right second plantar metatarsal head. MRI that was done on 10/6 was negative for osteomyelitis noted prior amputations of the second and fourth toes at the [facility]. Prior amputation of the third toe to the base of the middle phalanx. Prior amputation of the fifth toe to the head of the  metatarsal. 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 He does not recall about hearing about this. He did not have any pain but then again he has reduced sensation from diabetic neuropathy 10/15; right second plantar metatarsal head. Still in the same condition callus nonviable subcutaneous tissue which cleans up nicely with debridement but reforms by the next week. The patient tells me that he is offloading this is much as he can including taking his wheelchair to dialysis. We have been using silver alginate, changed to silver collagen today 10/22; right second plantar metatarsal head. Wound measures smaller but still thick callus around this wound. Still requiring debridement 11/5 right second plantar metatarsal head. Less callus around the wound circumference but still requiring debridement. There is still some undermining which I cleaned out the wound looks healthy but still considerable punched out depth with a relatively small circumference to the wound there is no palpable bone no purulent drainage 11/12; right  second plantar metatarsal head. Small wound with thick callus tissue around the wound and significant undermining. We have been using silver collagen after my continuous debridements of this area 11/19; patient arrives in today with purulent drainage coming out of the wound in the second third met head area on the right foot. Serosanguineous thick drainage. Specimen obtained for culture. 11/21/2019 on evaluation today patient appears to be doing well with regard to his foot ulcer. The good news is is culture came back negative for any bacteria there was no growth. The other good news is his x-ray also appears to be doing well. The foot is also measuring much better than what it was. Overall I am very pleased with how things seem to be progressing. 12/22; patient was in [facility] for several weeks due to the death of a family  member. He said he stayed off the wound using silver alginate. 01/03/2020. Patient has a small open area with roughly 0.3 cm of circumferential undermining. The orifice looks smaller. We have been using silver collagen. There is not a wound more aggressive way to offload this area as he has a prosthesis on the other leg 1/21; again the same small area is 2 weeks ago. We have been using silver collagen I change that to endoform today I really believe that this is an offloading issue 2/4; the area on the right third met head. We have been using endoform. 2/11 the area on the right third met head we have been using silver alginate. 2/25; the area on the right third met head. There is much less callus on this today. The patient states he is offloading this even more aggressively. As an example he is taking his wheelchair into dialysis on most days 3/4; right third metatarsal head. Again he has eschar over the surface of the wound which I removed with a #3 curette. Some subcutaneous debris. This has 0.8 cm of direct probing depth. I cannot feel any bone here. I did do a culture the area 3/11; right third metatarsal head. Again an eschared area over the wound which almost makes this look superficial. Again debridement reveals a wound with probing depth probably not much different from last week there is no palpable bone no palpable purulence. The patient tells me that he is making every effort to offload this area properly. He is taking wheelchair into dialysis etc. There is no option for forefoot offloading or a total contact cast. We used Oasis #1 today 3/18; again callus and eschar over the wound surface. I remove this but still a probing wound although only 3 mm in depth this time. This is an improvement 3/25; again callus and eschar over the wound surface which I removed the wound is much deeper today at 0.7 cm. There is no palpable bone. Because of the appearance of this a culture was done. I am not  going to put the Oasis back in this. Concerned about underlying infection. Notable for the fact that he is just finished doxycycline for the last go round of this 3/30; still 0.7 cm in depth which is disappointing. Culture I gave last time showed a few Staph aureus which is methicillin susceptible. This should have been taken care of by the recent course of doxycycline I gave him. We are using silver alginate 4/6; almost 1 cm in depth. We have been using silver alginate with underlying Bactroban. 4/15; about 1 cm in depth still. There is no palpable bone but there is undermining  thick surface again as usual. Our intake nurse noted what looked to be purulent drainage I suppose that could have been Bactroban but I did a culture for CandS 4/20; depth down 0.7 cm. Culture from last time was negative we have been using Bactroban and silver alginate 4/29; depth down 0.5 cm. Using silver alginate. He reports some drainage 5/6; comes in with the wound worse. Wound is deeper and tunnels. Measures 0.9 depth and an undermining area of 1.8 cm. 5/18; there is a change the dressing last week. Still using Sorbact hydrogel. What I can see of the base of the wound looks better. 6/1. Not much change. I changed him to endoform today. He has 1.1 cm in depth no palpable bone 6/8; again a small punched out wound with significant undermining. Culture I did last week was negative. Today he had some purulent drainage perhaps mixed with some retained endoform. 6/22; MRI report below ooIMPRESSION: Skin ulceration on the plantar surface of the foot at the level of the head of the second metatarsal. Mild marrow edema in the head of the second metatarsal could be reactive or due to osteomyelitis. Thin fluid collection surrounding the head and neck of the second metatarsal appears to communicate with the patient's skin wound and is worrisome for a septic collection. Advanced first MTP osteoarthritis. Prior amputations as  noted above. Electronically Signed By: Inge Rise M.D. On: 06/16/2020 11:51 PCR culture I did last time I believe showed peptostreptococcus. There was no comment on any other gram-negative or gram-positive organisms. I gave him a course of Flagyl which I think he is finishing up. We are not making any progress with the wound over the right second/third metatarsal head. We are using silver alginate putting him in kerlix Coban 6/29; has his appointment with Dr. [name] of orthopedics next Tuesday I think we see him on the same day. He was in for a nurse visit today. Nurse noted undermining of skin and callus so I was asked to look at this. We have been using silver alginate and kerlix Coban 7/6; patient saw Dr. [name] PA today will see Dr. [name] on Tuesday. The major question is does he needs surgery to clean out the area in question culture question resect any involved bone etc. Otherwise he will need to see infectious disease for broad-spectrum antibiotics given at dialysis 7/13; I talked to Dr. [name] late last week. He was not impressed with the MRI results thinking that the bone changes may have been reactive rather than osteomyelitis. He did not think he needed drainage of an abscess thinking of the fluid we are seeing was superficial and contiguous with the actual wound. He did not think he needed surgery or resection. Recommended consideration of infectious disease and return him to our care. We have been using silver alginate 7/20; PCR culture I did last week showed staph aureus methicillin sensitive. I have him on twice daily Keflex. Silver alginate to the wound 7/27; I have continued his 500 every 12 of cephalexin with a second dose after dialysis. My intention is to give him 4 to 6 weeks of antibiotics directed at [facility] 07/31/2020 on evaluation today patient appears to be doing a little better in regard to his overall wound size. This is good news. He does have a fairly  significant callus buildup still and I am can work on that today. Fortunately there is no signs of active infection at this time. He has been on Keflex and has an extension of  that that he is going to pick up and continue taking. 08/07/20-Patient back at 1 week with right met head wound looking about the same, overall the callus removal has helped 8/19; right third met head. Again extensive callus removal with a 10 scalpel. There is a small remaining wound we have been using silver alginate Patient also comes in having a fall and an abrasion on his left elbow just distally. This is a clean wound and should heal with simple measures 8/26; right third met head. This has a surface on this with some callus however this is as good as I have seen this looking quite some time. ooHe had a fall last week with abrasion in his right forearm this is healed 9/9; right third metatarsal head. At 1 point this was at the probing area over the third metatarsal head to bone. PCR or culture showed methicillin sensitive staph aureus I treated him with prolonged Keflex. Fortunately this has maintained the integrity since he is last here and he is closed Objective Constitutional Sitting or standing Blood Pressure is within target range for patient.. Pulse regular and within target range for patient.Marland Kitchen Respirations regular, non-labored and within target range.. Temperature is normal and within the target range for the patient.Marland Kitchen Appears in no distress. Vitals Time Taken: 12:43 PM, Height: 73 in, Weight: 202 lbs, BMI: 26.6, Temperature: 98.9 F, Pulse: 87 bpm, Respiratory Rate: 18 breaths/min, Blood Pressure: 110/68 mmHg. Cardiovascular Pedal pulses are palpable. General Notes: Wound exam; right plantar third metatarsal head. This is closed with really healthy looking epithelialization. No evidence of surrounding infection. 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 Skin Barriers/Peri-Wound Care: Moisturizing lotion - leg and foot daily Primary Wound Dressing: Foam - keep bottom of right foot padded with foam for protection Edema Control: Avoid standing for long periods of time Elevate legs to the level of the heart or above for 30 minutes daily and/or when sitting, a frequency of: - throughout the day 1. Patient is discharged from the clinic 2. He can keep this area padded as he has been doing in his foot wear 3. This initially had underlying osteomyelitis. I gave him 6 weeks of oral antibiotics adjusted to the fact he is on dialysis. This is going to have to be watched over time. There is no way to know whether the underlying infection has been totally eradicated. As such she is at risk for further breakdown. Hopefully not Electronic Signature(s) Signed: 09/05/2020 1:37:47 PM By: Linton Ham MD Entered By: Linton Ham on 09/04/2020 13:25:39 -------------------------------------------------------------------------------- SuperBill Details Patient Name: Date of Service: Kateri Plummer 09/04/2020 Medical Record Number: [id number] Patient Account Number: [id number] Date of Birth/Sex: Treating RN: March 31, 1951 (69 y.o. [name], [name] Primary Care Provider: [name] Other Clinician: Referring Provider: Treating Provider/Extender: Tanna Savoy in Treatment: 64 Diagnosis Coding ICD-10 Codes Code Description E11.621 Type 2 diabetes mellitus with foot ulcer L97.512 Non-pressure chronic ulcer of other part of right foot with fat layer exposed I70.298 Other atherosclerosis of native arteries of extremities,  other extremity L03.115 Cellulitis of right lower limb S50.312D Abrasion of left elbow, subsequent encounter Facility Procedures The patient participates with  Medicare or their insurance follows the Medicare Facility Guidelines: CPT4 Code Description Modifier Quantity [id number] [place] VISIT-LEV 2 EST PT 1 Physician Procedures : CPT4 Code Description Modifier [id number] - WC PHYS LEVEL 3 - EST PT ICD-10 Diagnosis Description E11.621 Type 2 diabetes mellitus with foot ulcer L97.512 Non-pressure chronic ulcer of other part of right foot with fat layer exposed Quantity: 1 Electronic Signature(s) Signed: 09/05/2020 1:37:47 PM By: Linton Ham MD Entered By: Linton Ham on 09/04/2020 13:26:01

## 2021-02-03 ENCOUNTER — Other Ambulatory Visit: Payer: Self-pay

## 2021-02-03 ENCOUNTER — Encounter: Payer: Self-pay | Admitting: Neurology

## 2021-02-03 ENCOUNTER — Ambulatory Visit (INDEPENDENT_AMBULATORY_CARE_PROVIDER_SITE_OTHER): Payer: Medicare Other | Admitting: Neurology

## 2021-02-03 VITALS — BP 105/64 | HR 82 | Ht 73.0 in | Wt 222.5 lb

## 2021-02-03 DIAGNOSIS — N186 End stage renal disease: Secondary | ICD-10-CM

## 2021-02-03 DIAGNOSIS — G250 Essential tremor: Secondary | ICD-10-CM | POA: Diagnosis not present

## 2021-02-03 NOTE — Progress Notes (Signed)
Chief Complaint  Patient presents with  . New Patient (Initial Visit)    Referred to have his bilateral hand tremors evaluated. He is currently taking primidone 73m, two tablets at bedtime. He does not feel it controls his symptoms. He is having a hard time holding utensils when eating.     HISTORICAL  MLenzie Sandler is a 70year old male, seen in request by his primary care physician Dr. JKristie Cowmanfor evaluation of tremor, initial evaluation was on 02/03/2021,  I reviewed and summarized the referring note.  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%, Diabetes, insulin-dependent Coronary artery disease Peripheral vascular disease, requiring right first, second toe amputation, left toe amputation Left BKA End-stage renal disease on dialysis, M, W, F Iron deficiency anemia  His mother had tremor, so is one of his siblings, he began to notice gradual onset bilateral hand shaking since 2010, gradually getting worse, especially since his left hand infection following a spider bite, he noticed increased left hand shaking, he is right-hand dominant, he also noticed difficulty drawing his insulin from the small bottle, give himself injection, using utensils, writing, to the point of needing some help, and causing social embarrassment  He was treated with tramadol 50 mg 2 tablets every night since 2020, there was no significant improvement, recently methocarbamol 500 mg 4 times a day was added on, initially he has mild improvement, no longer noticed any benefit, but he continue complains of sleepiness fatigue after each dose  Laboratory evaluations in 2021, ESR was elevated to 77, hemoglobin of 11.9, iron was 61, negative HCV antibody, hepatitis B core antibody, surface antigen, positive hepatitis B surface antibody, potassium 6.2, C-reactive protein was 55, A1c was 6.3, TSH was normal 1.2,    REVIEW OF SYSTEMS: Full  14 system review of systems performed and notable only for above All other review of systems were negative.  ALLERGIES: Allergies  Allergen Reactions  . Penicillins Hives, Swelling and Other (See Comments)    Ankle swelling Has patient had a PCN reaction causing immediate rash, facial/tongue/throat swelling, SOB or lightheadedness with hypotension: Yes Has patient had a PCN reaction causing severe rash involving mucus membranes or skin necrosis: No Has patient had a PCN reaction that required hospitalization: No Has patient had a PCN reaction occurring within the last 10 years: No If all of the above answers are "NO", then may proceed with Cephalosporin use.    HOME MEDICATIONS: Current Outpatient Medications  Medication Sig Dispense Refill  . acetaminophen (TYLENOL) 325 MG tablet Take 650 mg by mouth every 6 (six) hours as needed (pain).     . ferric citrate (AURYXIA) 1 GM 210 MG(Fe) tablet Take 420 mg by mouth 3 (three) times daily with meals.     . insulin NPH-regular Human (NOVOLIN 70/30) (70-30) 100 UNIT/ML injection Inject 5-11 Units into the skin 3 (three) times daily before meals. Sliding Scale - based on CBG    . methocarbamol (ROBAXIN) 500 MG tablet Take 1,000 mg by mouth 4 (four) times daily as needed.    . multivitamin (RENA-VIT) TABS tablet Take 1 tablet by mouth daily.    . mupirocin ointment (BACTROBAN) 2 % Apply 1 application topically daily as needed (rassh).   0  . oxyCODONE (ROXICODONE) 5 MG immediate release tablet Take 1 tablet (5 mg total) by mouth every 4 (four) hours as needed. 10 tablet 0  . primidone (MYSOLINE) 50 MG tablet Take 100 mg by  mouth at bedtime.    . timolol (TIMOPTIC) 0.5 % ophthalmic solution Place 1 drop into the left eye every morning.      No current facility-administered medications for this visit.    PAST MEDICAL HISTORY: Past Medical History:  Diagnosis Date  . Cardiomyopathy ([place])   . Cataract    right eye  . Diabetes mellitus     type 2  . Diabetic foot ulcer ([place])   . Diabetic infection of right foot ([place])   . Diabetic peripheral neuropathy ([place])   . ESRD (end stage renal disease) ([place])    m-w-f [address]  . Glaucoma    left eye  . Headache   . HTN (hypertension)    reports not needed medication for 2 years  . Hypotension   . Osteomyelitis ([place])   . Peripheral vascular disease ([place])    critical limb ischemia  . Tremor     PAST SURGICAL HISTORY: Past Surgical History:  Procedure Laterality Date  . ABDOMINAL AORTOGRAM N/A 05/04/2018   Procedure: ABDOMINAL AORTOGRAM;  Surgeon: Conrad Allegheny, MD;  Location: [facility];  Service: Cardiovascular;  Laterality: N/A;  . AMPUTATION Left 11/20/2018   Procedure: AMPUTATION BELOW KNEE;  Surgeon: Dorna Leitz, MD;  Location: [place];  Service: Orthopedics;  Laterality: Left;  . AMPUTATION TOE Right 01/21/2017   Procedure: AMPUTATION 2ND TOE RIGHT FOOT ;  Surgeon: Dorna Leitz, MD;  Location: [place];  Service: Orthopedics;  Laterality: Right;  . AMPUTATION TOE Left 05/18/2017   Procedure: 5TH RAY AMPUTATION  and EXCISION CUBOID;  Surgeon: Dorna Leitz, MD;  Location: [facility];  Service: Orthopedics;  Laterality: Left;  . AMPUTATION TOE Bilateral 01/27/2018   Procedure: RIGHT FOOT 3RD TOE AMPUTATION WITH INCISIONAL WOUND DEBRIDEMENT. LEFT FOOT 2ND TOE AMPUTATION AND INCISIONAL WOUND DEBRIDEMENT.;  Surgeon: Dorna Leitz, MD;  Location: [facility];  Service: Orthopedics;  Laterality: Bilateral;  . AV FISTULA PLACEMENT Left 11/27/2014   Procedure: ARTERIOVENOUS (AV) FISTULA CREATION;  Surgeon: Conrad [place], MD;  Location: [facility];  Service: Vascular;  Laterality: Left;  . CHOLECYSTECTOMY    . COLONOSCOPY N/A 11/28/2014   Procedure: COLONOSCOPY;  Surgeon: Ladene Artist, MD;  Location: [facility];  Service: Endoscopy;  Laterality: N/A;  . ESOPHAGOGASTRODUODENOSCOPY N/A 11/28/2014   Procedure: ESOPHAGOGASTRODUODENOSCOPY (EGD);  Surgeon: Ladene Artist, MD;   Location: [facility] ENDOSCOPY;  Service: Endoscopy;  Laterality: N/A;  . EYE SURGERY     retina and cataract left eye surgery  . I & D EXTREMITY  01/28/2012   Procedure: IRRIGATION AND DEBRIDEMENT EXTREMITY;  Surgeon: Paulene Floor, MD;  Location: [facility];  Service: Orthopedics;  Laterality: Left;  irrigation and drainage left hand  . I & D EXTREMITY  01/30/2012   Procedure: IRRIGATION AND DEBRIDEMENT EXTREMITY;  Surgeon: Paulene Floor, MD;  Location: [facility];  Service: Orthopedics;  Laterality: Left;  flexor teno synovectomy and extensor tenosynovectomy arthrosynovectomy wristjoint  . I & D EXTREMITY  02/01/2012   Procedure: IRRIGATION AND DEBRIDEMENT EXTREMITY;  Surgeon: Paulene Floor, MD;  [facility]: [facility];  Service: Orthopedics;  Laterality: Left;  Left Wrist, Hand and Forearm  . IRRIGATION AND DEBRIDEMENT FOOT Left 01/21/2017   Procedure: DEBRIDEMENT LEFT PLANTAR WOUND;  Surgeon: Dorna Leitz, MD;  Location: [place];  Service: Orthopedics;  Laterality: Left;  . Left 4th toe amputation    . LEFT AND RIGHT HEART CATHETERIZATION WITH CORONARY ANGIOGRAM N/A 11/26/2014  Procedure: LEFT AND RIGHT HEART CATHETERIZATION WITH CORONARY ANGIOGRAM;  Surgeon: Leonie Man, MD;  Location: [facility] C[facility] LAB;  Service: Cardiovascular;  Laterality: N/A;  . LOWER EXTREMITY ANGIOGRAPHY Bilateral 05/04/2018   Procedure: Lower Extremity Angiography;  Surgeon: Conrad Fillmore, MD;  Location: [facility];  Service: Cardiovascular;  Laterality: Bilateral;  . LOWER EXTREMITY ANGIOGRAPHY Left 05/11/2018   Procedure: LOWER EXTREMITY ANGIOGRAPHY;  Surgeon: Conrad Potlatch, MD;  Location: [facility];  Service: Cardiovascular;  Laterality: Left;  . PERIPHERAL VASCULAR BALLOON ANGIOPLASTY Left 05/11/2018   Procedure: PERIPHERAL VASCULAR BALLOON ANGIOPLASTY;  Surgeon: Conrad Sumner, MD;  Location: [facility];  Service: Cardiovascular;  Laterality: Left;  Posterior tibial  . PERIPHERAL VASCULAR INTERVENTION  Left 05/11/2018   Procedure: PERIPHERAL VASCULAR INTERVENTION;  Surgeon: Conrad Gerlach, MD;  Location: [facility];  Service: Cardiovascular;  Laterality: Left;  tibioperoneal trunk  . REVISION OF ARTERIOVENOUS GORETEX GRAFT Left 06/19/2019   Procedure: BANDING OF ARTERIOVENOUS FISTULA LEFT ARM;  Surgeon: Angelia Mould, MD;  Location: [place];  Service: Vascular;  Laterality: Left;  . TEE WITHOUT CARDIOVERSION N/A 11/20/2018   Procedure: TRANSESOPHAGEAL ECHOCARDIOGRAM (TEE);  Surgeon: Jerline Pain, MD;  Location: [facility];  Service: Cardiovascular;  Laterality: N/A;  . TONSILLECTOMY      FAMILY HISTORY: Family History  Problem Relation Age of Onset  . Heart disease Mother   . Diabetes Mother   . Diabetes Father   . Diabetes Brother   . Diabetes Sister   . Diabetes Brother   . Diabetes Brother     SOCIAL HISTORY: Social History   Socioeconomic History  . Marital status: Married    Spouse name: [name]  . Number of children: 3  . Years of education: 49 years - college  . Highest education level: Not on file  Occupational History  . Occupation: Retired    Fish farm manager: [employer]  Tobacco Use  . Smoking status: Former Smoker    Packs/day: 0.50    Years: 4.00    Pack years: 2.00    Types: Cigarettes  . Smokeless tobacco: Never Used  . Tobacco comment: about 40 yrs ago  Vaping Use  . Vaping Use: Never used  Substance and Sexual Activity  . Alcohol use: Not Currently    Alcohol/week: 0.0 standard drinks  . Drug use: Not Currently  . Sexual activity: Yes  Other Topics Concern  . Not on file  Social History Narrative   Lives at home with his wife, [name].   Right-handed.   2 cups caffeine per day.   Social Determinants of Health   Financial Resource Strain: Not on file  Food Insecurity: Not on file  Transportation Needs: Not on file  Physical Activity: Not on file  Stress: Not on file  Social Connections: Not on file  Intimate Partner Violence: Not on  file     PHYSICAL EXAM   Vitals:   02/03/21 0919  BP: 105/64  Pulse: 82  Weight: 222 lb 8 oz (100.9 kg)  Height: '6\' 1"'  (1.854 m)   Not recorded     Body mass index is 29.36 kg/m.  PHYSICAL EXAMNIATION:  Gen: NAD, conversant, well nourised, well groomed                     Cardiovascular: Regular rate rhythm, no peripheral edema, warm, nontender. Eyes: Conjunctivae clear without exudates or hemorrhage Neck: Supple, no carotid bruits.  Pulmonary: Clear to auscultation bilaterally   NEUROLOGICAL EXAM:  MENTAL STATUS: Speech:    Speech is normal; fluent and spontaneous with normal comprehension.  Cognition:     Orientation to time, place and person     Normal recent and remote memory     Normal Attention span and concentration     Normal Language, naming, repeating,spontaneous speech     Fund of knowledge   CRANIAL NERVES: CN II: Visual fields are full to confrontation. Pupils are round equal and briskly reactive to light. CN III, IV, VI: extraocular movement are normal. No ptosis. CN V: Facial sensation is intact to light touch CN VII: Face is symmetric with normal eye closure  CN VIII: Hearing is normal to causal conversation. CN IX, X: Phonation is normal. CN XI: Head turning and shoulder shrug are intact  MOTOR: Status post left AKA with prosthetic, evidence of left hand surgery, left hand crawl, mainly involving left fourth and fifth fingers, with fixed contraction of left interphalangeal joints, mild atrophy of left hand intrinsic muscles  REFLEXES: Reflexes are 1 and symmetric at the biceps, triceps, absent at the left knee knees and ankles. Plantar responses are flexor.  SENSORY: Length dependent sensory changes, decreased light touch pinprick at left fourth fifth fingers extending to left arm  COORDINATION: There is no trunk or limb dysmetria noted.  GAIT/STANCE: He needs to push on chair arm to get up from seated position, mildly  unsteady   DIAGNOSTIC DATA (LABS, IMAGING, TESTING) - I reviewed patient records, labs, notes, testing and imaging myself where available.   ASSESSMENT AND PLAN  [name]. is a 70 y.o. male   Essential tremor  Strong family history, his mother, sibling has similar tremor, he had longstanding history of tremor for more than 10 years, gradually getting worse  Previously attempted treatment with primidone 50 mg 2 tablets every night methocarbamol did not help his symptoms but caused significant side effect in the setting of polypharmacy, end-stage renal disease, hemodialysis dependent.  I was able to watch him drawl normal saline with 1 cc syringe, suggested him to steady his elbow, even asking his family's help for insulin injection, may also use modified utensil.  I have suggest him to stop primidone/methocarbamol   Marcial Pacas, M.D. Ph.D.  [facility] [address] [place], [place] [address] Ph: [phone] Fax: [phone]  CC:  Madelon Lips, [place],  [place] [id number]

## 2021-02-17 NOTE — Progress Notes (Signed)
Assessment/Plan:   1.  Essential Tremor.  -This is evidenced by the symmetrical nature and longstanding hx of gradually getting worse.  We discussed nature and pathophysiology.  We discussed that this can continue to gradually get worse with time.  We discussed that some medications can worsen this, as can caffeine use.  We discussed medication therapy as well as surgical therapy.  The patient's medication options are very limited because of end-stage renal disease.  We discussed going back on the primidone, this time in the daytime and at higher dosages (previously on 100 mg at bedtime).  We also discussed low-dose topiramate (would have to be given low dose after dialysis).  However, I did discuss with him that this would likely make very little impact on the severe tremor on the left, but it may make some impact on his dominant hand on the right.  He asked me specifically about focused ultrasound and we ended up discussing both focused ultrasound and deep brain stimulation and the risks and benefits of both.  He was shown HIPAA compliant videos.  I am not sure he would be a DBS candidate given his multiple other medical problems (including the fact that he has had infections in the past related to his stump, requiring IV antibiotics post surgery).  He is not only a diabetic, but has end-stage renal disease on dialysis and history of coronary artery disease.  He may be a better focused ultrasound candidate, but there is a risk of loss of balance with that surgery and we would only be able to do it unilateral.  He and I discussed that.  He would also need to go either to [facility] or to [place] for that surgery.  After quite some discussion, he just wanted to think about his options.  He will get back to me if he wants to do anything.   Subjective:   [name] WITKOWSKI Sr. was seen in consultation in the movement disorder clinic at the request of Madelon Lips, MD.  The evaluation is for tremor.  This is a  second opinion.  Patient was just seen at [facility] neurology on February 8 for the same.  He was a new patient at their office on that day that as well.  Those records are reviewed.  He saw Dr. Krista Blue.  She suggested that he stop primidone, 100 mg as well as methocarbamol and use weighted utensils.  Pt stated today that he felt somewhat restless with the primidone before falling to sleep and he wasn't sure if they were dreams or hallucinations when first going to sleep.  He was on it for 2 years.  He does think that it helped initially.  Patient is a 70 year old diabetic male with hypertension, congestive heart failure, coronary artery disease, end-stage renal disease on dialysis who presents today for evaluation of his tremor.  Tremor started approximately 12 years ago and involves the bilateral upper extremities.  It started in the L hand 10-12 years ago and in the R hand about 2 years ago.   Patient is right-hand dominant.  Tremor is most noticeable when drawing up his insulin, writing, eating.   There is a family hx of tremor in his mother.    Affected by caffeine:  No. Affected by alcohol:  Yes, but doesn't drink a lot Affected by stress:  Yes.   Affected by fatigue:  Yes.  , just physical fatigue Spills soup if on spoon:  Yes.   , has to hold the  spoon with both hands Spills glass of liquid if full:  Yes.  , some days Affects ADL's (tying shoes, brushing teeth, etc):  No.  Current/Previously tried tremor medications: Tramadol, methocarbamol (sleepy), primidone, 50 mg, 2 tablets at bedtime   Outside reports reviewed: historical medical records, office notes and referral letter/letters.  Allergies  Allergen Reactions  . Penicillins Hives, Swelling and Other (See Comments)    Ankle swelling Has patient had a PCN reaction causing immediate rash, facial/tongue/throat swelling, SOB or lightheadedness with hypotension: Yes Has patient had a PCN reaction causing severe rash involving mucus membranes  or skin necrosis: No Has patient had a PCN reaction that required hospitalization: No Has patient had a PCN reaction occurring within the last 10 years: No If all of the above answers are "NO", then may proceed with Cephalosporin use.    Current Outpatient Medications  Medication Instructions  . acetaminophen (TYLENOL) 650 mg, Oral, Every 6 hours PRN  . Auryxia 420 mg, Oral, 3 times daily with meals  . insulin NPH-regular Human (NOVOLIN 70/30) (70-30) 100 UNIT/ML injection 5-11 Units, Subcutaneous, 3 times daily before meals, Sliding Scale - based on CBG  . multivitamin (RENA-VIT) TABS tablet 1 tablet, Oral, Daily  . mupirocin ointment (BACTROBAN) 2 % 1 application, Topical, Daily PRN  . oxyCODONE (ROXICODONE) 5 mg, Oral, Every 4 hours PRN  . timolol (TIMOPTIC) 0.5 % ophthalmic solution 1 drop, Left Eye, BH-each morning     Objective:   VITALS:   Vitals:   02/19/21 0937  BP: 122/78  Pulse: 99  SpO2: 97%  Weight: 222 lb (100.7 kg)  Height: '6\' 1"'$  (1.854 m)   Gen:  Appears stated age and in NAD. HEENT:  Normocephalic, atraumatic. The mucous membranes are moist. The superficial temporal arteries are without ropiness or tenderness. Cardiovascular: Regular rate and rhythm. Lungs: Clear to auscultation bilaterally. Neck: There are no carotid bruits noted bilaterally.  NEUROLOGICAL:  Orientation:  The patient is alert and oriented x 3.   Cranial nerves: There is good facial symmetry. Extraocular muscles are intact and visual fields are full to confrontational testing. Speech is fluent and clear. Soft palate rises symmetrically and there is no tongue deviation. Hearing is intact to conversational tone. Tone: Tone is good throughout. Coordination:  The patient has no dysdiadichokinesia or dysmetria. Motor: Strength is 5/5 in the bilateral upper and right lower extremities.  He has a left BKA.  Shoulder shrug is equal bilaterally.  There is no pronator drift.  There are no  fasciculations noted. Gait and Station: The patient ambulates with his cane in an antalgic fashion (he has a prosthesis).  MOVEMENT EXAM: Tremor:  There is  tremor in the UE, noted most significantly with action.  Tremor on the left is much more significant than that of the right.  Left postural tremor is moderate to severe in nature and is mild to moderate on the right.  There is a rest component on the L.  Patient has significant difficulty with Archimedes spirals on the left.  He does better on the right.    I have reviewed and interpreted the following labs independently   Chemistry      Component Value Date/Time   NA 140 06/19/2019 0815   K 4.3 06/19/2019 0815   CL 90 (L) 11/29/2018 1245   CO2 26 11/29/2018 1245   BUN 56 (H) 11/29/2018 1245   CREATININE 9.36 (H) 11/29/2018 1245      Component Value Date/Time   CALCIUM  8.5 (L) 11/29/2018 1245   ALKPHOS 54 11/17/2018 1346   AST 49 (H) 11/17/2018 1346   ALT 37 11/17/2018 1346   BILITOT 0.9 11/17/2018 1346      Lab Results  Component Value Date   WBC 10.3 11/29/2018   HGB 15.6 06/19/2019   HCT 46.0 06/19/2019   MCV 91.8 11/29/2018   PLT 437 (H) 11/29/2018   Lab Results  Component Value Date   TSH 1.184 08/10/2011      Total time spent on today's visit was 68 minutes, including both face-to-face time and nonface-to-face time.  Time included that spent on review of records (prior notes available to me/labs/imaging if pertinent), discussing treatment and goals, answering patient's questions and coordinating care.  CC:  [name], MD

## 2021-02-19 ENCOUNTER — Other Ambulatory Visit: Payer: Self-pay

## 2021-02-19 ENCOUNTER — Ambulatory Visit (INDEPENDENT_AMBULATORY_CARE_PROVIDER_SITE_OTHER): Payer: Medicare Other | Admitting: Neurology

## 2021-02-19 ENCOUNTER — Encounter: Payer: Self-pay | Admitting: Neurology

## 2021-02-19 VITALS — BP 122/78 | HR 99 | Ht 73.0 in | Wt 222.0 lb

## 2021-02-19 DIAGNOSIS — G250 Essential tremor: Secondary | ICD-10-CM | POA: Diagnosis not present

## 2021-02-19 DIAGNOSIS — N186 End stage renal disease: Secondary | ICD-10-CM

## 2021-02-19 NOTE — Patient Instructions (Signed)
Your options today:  1.  [facility] in [place] is all self referral.  You may call the following number to see if you are a candidate at their institution:  [phone].  Their email is: [email or web address].  Please let us know the outcome of this phone call since they don't allow [place] to refer and therefore we don't get feedback ourselves.    2.  We can try primidone at higher dosages  3.  We can see if you are a candidate for deep brain stimulation but you may not be because of your other medical problems.     Deep Brain Stimulation  Is it the right choice for me?   What is Deep Brain Stimulation (DBS) Surgery?  DBS is a surgical procedure used to treat symptoms of Essential Tremor (ET). It involves the implantation of an electrode into the brain either on one side or both sides. The area of the brain that is typically targeted in ET is the ventral intermediate (VIM) nucleus of the thalamus.   How does DBS work?  The tremor in ET is due to an overactive and oscillating circuit in the brain. With DBS, electricity is sent down the electrodes into this area of the brain to disrupt this abnormal circuit, thereby blocking the tremor. It is important to remember, however, that DBS does not "cure" the disease, but rather is a method of treating the symptoms.   What is involved in the procedure?  The surgical procedure involves the implantation of either one (to treat tremor on one side of the body) or two (to treat tremor on both sides of the body) electrodes. The electrodes are connected to a wire, which is then connected to a generator (either one or two) in the chest. The generator (and wires) are placed under the skin similar to a cardiac pacemaker, thus the device itself is not visible. There will,  however, be a visible lump under the skin where the pacemaker is placed. There will also be small bumps on the scalp where the electrode is secured to the skull.   What symptoms  can I expect DBS to treat?  In ET the main symptom is the tremor which the DBS system will treat.   What is the downside to having DBS surgery?  There are 2 major factors that need to be considered prior to having surgery, 1) Risks involved, and 2) the "inconvenience factor".   What are the risks of surgery?  Because the surgery involves introducing a foreign object into the brain, there are inherent risks that are present. First, there is a very small risk, approximately 1-2% chance, of having bleeding into the brain causing symptoms similar to that of a stroke. Secondly, there is a 5-10% chance of having an infection related to the procedure. If the device gets infected, then the treatment usually requires that the infected hardware be removed temporarily while antibiotics are given. After the infection is resolved, then the hardware needs to be re-implanted. This would not leave the patient with permanent problems, but it is easy to understand how disappointed someone might be if they have to go through the surgery again. Symptoms of infection include redness, swelling, or pain around the device. Another risk of brain surgery includes possible seizure. Seizures are abnormal electrical discharges of brain cells. If a seizure occurs, it is almost always at the time of operation. It may require temporarily being treated with seizure medications, but this is typically only short term.  Very rarely (much less than 1%) does the infection become more serious and involve the brain.  In some patients with ET we will place electrodes on both sides of the brain to treat tremor on both sides of the body. In these patients there is a 30% chance of causing speech or balance problems when both sides are fully activated. To get rid of this side effect, the intensity of stimulation usually needs to be lowered in one of the electrodes. If this is done, the side effect may well resolve, but some of the tremor may return.   If the electrodes are perfectly placed and programmed, we can expect an impressive improvement in your tremor. If they are close, but not quite perfectly placed, then there may be some residual tremor  which does not resolve with treatment. This is the main reason we go to such lengths the day of the electrode implantation to be sure we have the electrode optimally placed.   What is the "inconvenience factor"?  Unfortunately, undergoing DBS surgery is a process involving multiple steps. Even prior to surgery, you will have several visits (which are explained in detail on the subsequent page). The surgery itself takes place in three separate parts. About a week prior to insertion of the electrodes, you will be seen in an ambulatory surgery center to put in markers into the skull, called fiducials. This allows [place] to plan the surgery and to better localize the area in which we will operate. One week later, stage 1 of the procedure will be done in which the electrodes are implanted. Approximately one week later, stage 2 of the surgery will be done in which the generator (battery) is inserted. Weeks later, programming of the device will take place. Programming is fine-tuned over a series of clinic visits, initially 1-2x per week, and then eventually less frequent. Overall, you can expect at least 8-10 visits prior to seeing benefit from DBS surgery.   Is DBS the right choice for you?  As you can now see, there are many things that carefully need to be considered when making this decision. The ultimate decision is yours to make. ET is not a life threatening disease. It can be very disabling and significantly interfere with one's quality of life. It is our job to provide you with all the pros and cons that can help you make the right choice for you. The main question is whether the tremor is so bothersome to you that it is worth it to take a small but significant risk to treat it. We hope this information will  guide you in your decision.   Pre-Operative Visits:  1) During this visit your exam will be videotaped with your consent. We will discuss the details of the surgery and answer any more questions you might have.  2) Neuropsychological Testing. This is standard testing in all potential candidates to help determine those patients that may be at high risk for developing worsening cognition from the procedure. This is a long clinic visit (multiple hours) and can be quite exhausting.  3) Pre-Operative MRI. If you are deemed to be a good surgical candidate based on the above 2 visits, you will need to have MRI imaging done to be used in the surgical planning process. You will need someone to accompany you to this visit that can drive, as sometimes it is necessary to sedate you for the MRI in order to get adequate pictures.  4) You will also have an  appointment with the neurosurgeon who works with Dr. Carles Collet. His name is Dr. Vertell Limber.  What to expect regarding the surgery itself:  1. The first step involves placement of the fiducial pins. You are given 4 local injections of anesthetic (numbing medication). Next, 4 pins are screwed into the skull. Following the placement of the pins, you will be transferred down to have a head CT scan. This is used in planning for the surgery. This step is generally done one week prior to scheduled surgery.  2. One week later, you will arrive to the pre-op area OFF of any tremor medications that may have been prescribed. You will meet nursing and anesthesia staff.  3. You will have the sense of "hurry up and wait" multiple times throughout the day, but it is extremely important to remain as patient as possible. It is during these times that we are busy "behind the scenes" doing the surgical planning  4. You are then brought into the OR suite and placed in a "beach chair"-like position. You will not be under general anesthesia. We need you to be awake during certain parts to allow [place] to  do important testing. Because you are awake and having brain surgery, it is not surprising that this is very anxiety provoking and scary! We understand this and will help you through it to the best of our abilities. The actual surgical procedure is not usually painful. It is done with local anesthetic agents. However, the procedure can take many hours, and it is expected that you'll become uncomfortable. We try to minimize any sedating medications, but will give you pain medicines if needed.  5. You will have a bad haircut, but it will grow back!  6. Following the surgery, you will stay overnight in the hospital for observation.  7. The following day, you will have either an MRI brain or a CT brain to allow [place] to evaluate the placement of the electrodes and also to make sure there were no bleeding complications. Provided there are no complications, you will be discharged home the day after surgery.    It is extremely important to remember that after having DBS surgery, you will need to notify your physicians before you have an MRI.  Most DBS electrodes ARE now MRI compatible but the DBS must be placed in a special mode before the MRI is completed for your safety.  8. About 1 weeks later you will return for an outpatient surgery that lasts 1-2 hours during which the generator(s) will be placed. You will go home on the same day as the surgery. You will find that you are more uncomfortable after this surgery then your first surgery. You will be given medications to help with this. The pain from this surgery usually resolves in 2-3 days.

## 2021-10-20 ENCOUNTER — Telehealth: Payer: Self-pay | Admitting: Cardiology

## 2021-10-20 NOTE — Telephone Encounter (Signed)
New Message:    Patient says he is being considered for a Kidney Transplant. He said they require that he have these two test, an Echo and  Nuclear Stress Test. Lelon Frohlich from the Dialysis will give you a call and arrange the test for him.

## 2021-10-21 NOTE — Telephone Encounter (Signed)
Patient will need to be seen as it has been over 1 year.

## 2021-10-27 NOTE — Telephone Encounter (Signed)
Left message for pt to call to schedule follow up appointment.

## 2021-10-30 ENCOUNTER — Other Ambulatory Visit: Payer: Self-pay | Admitting: Nephrology

## 2021-10-30 DIAGNOSIS — N186 End stage renal disease: Secondary | ICD-10-CM

## 2021-11-24 ENCOUNTER — Ambulatory Visit
Admission: RE | Admit: 2021-11-24 | Discharge: 2021-11-24 | Disposition: A | Discharge status: Home / Self Care | Payer: Medicare Other | Source: Ambulatory Visit | Attending: Nephrology | Admitting: Nephrology

## 2021-11-24 ENCOUNTER — Other Ambulatory Visit: Payer: Self-pay

## 2021-11-24 DIAGNOSIS — N186 End stage renal disease: Secondary | ICD-10-CM

## 2021-12-09 NOTE — Progress Notes (Addendum)
Cardiology Clinic Note   Patient Name: [name] CRYDER Sr. Date of Encounter: 12/10/2021  Primary Care Provider:  [name], MD Primary Cardiologist:  Kirk Ruths, MD  Patient Profile    [name], Sr. 70 year old male presents today for follow-up evaluation of his hypertension, cardiomegaly, dilated cardiomyopathy, and paroxysmal SVT. Past Medical History    Past Medical History:  Diagnosis Date   Cardiomyopathy ([place])    Cataract    right eye   Diabetes mellitus    type 2   Diabetic foot ulcer ([place])    Diabetic infection of right foot ([place])    Diabetic peripheral neuropathy ([place])    ESRD (end stage renal disease) ([place])    m-w-f [facility] [address]   Glaucoma    left eye   Headache    HTN (hypertension)    reports not needed medication for 2 years   Hypotension    Osteomyelitis ([place])    Peripheral vascular disease ([place])    critical limb ischemia   Tremor    Past Surgical History:  Procedure Laterality Date   ABDOMINAL AORTOGRAM N/A 05/04/2018   Procedure: ABDOMINAL AORTOGRAM;  Surgeon: Conrad Belton, MD;  Location: [place] [facility];  Service: Cardiovascular;  Laterality: N/A;   AMPUTATION Left 11/20/2018   Procedure: AMPUTATION BELOW KNEE;  Surgeon: Dorna Leitz, MD;  Location: [place];  Service: Orthopedics;  Laterality: Left;   AMPUTATION TOE Right 01/21/2017   Procedure: AMPUTATION 2ND TOE RIGHT FOOT ;  Surgeon: Dorna Leitz, MD;  Location: [place];  Service: Orthopedics;  Laterality: Right;   AMPUTATION TOE Left 05/18/2017   Procedure: 5TH RAY AMPUTATION  and EXCISION CUBOID;  Surgeon: Dorna Leitz, MD;  Location: [place];  Service: Orthopedics;  Laterality: Left;   AMPUTATION TOE Bilateral 01/27/2018   Procedure: RIGHT FOOT 3RD TOE AMPUTATION WITH INCISIONAL WOUND DEBRIDEMENT. LEFT FOOT 2ND TOE AMPUTATION AND INCISIONAL WOUND DEBRIDEMENT.;  Surgeon: Dorna Leitz, MD;  Location: [facility];  Service: Orthopedics;  Laterality: Bilateral;   AV  FISTULA PLACEMENT Left 11/27/2014   Procedure: ARTERIOVENOUS (AV) FISTULA CREATION;  Surgeon: Conrad White Signal, MD;  Location: [place];  Service: Vascular;  Laterality: Left;   CHOLECYSTECTOMY     COLONOSCOPY N/A 11/28/2014   Procedure: COLONOSCOPY;  Surgeon: Ladene Artist, MD;  Location: [facility]OS[facility];  Service: Endoscopy;  Laterality: N/A;   ESOPHAGOGASTRODUODENOSCOPY N/A 11/28/2014   Procedure: ESOPHAGOGASTRODUODENOSCOPY (EGD);  Surgeon: Ladene Artist, MD;  Location: [facility] ENDOSCOPY;  Service: Endoscopy;  Laterality: N/A;   EYE SURGERY     retina and cataract left eye surgery   I & D EXTREMITY  01/28/2012   Procedure: IRRIGATION AND DEBRIDEMENT EXTREMITY;  Surgeon: Paulene Floor, MD;  [facility];  Service: Orthopedics;  Laterality: Left;  irrigation and drainage left hand   I & D EXTREMITY  01/30/2012   Procedure: IRRIGATION AND DEBRIDEMENT EXTREMITY;  Surgeon: Paulene Floor, MD;  [facility];  Service: Orthopedics;  Laterality: Left;  flexor teno synovectomy and extensor tenosynovectomy arthrosynovectomy wristjoint   I & D EXTREMITY  02/01/2012   Procedure: IRRIGATION AND DEBRIDEMENT EXTREMITY;  Surgeon: Paulene Floor, MD;  [facility];  Service: Orthopedics;  Laterality: Left;  Left Wrist, Hand and Forearm   IRRIGATION AND DEBRIDEMENT FOOT Left 01/21/2017   Procedure: DEBRIDEMENT LEFT PLANTAR WOUND;  Surgeon: Dorna Leitz, MD;  Location: [place];  Service: Orthopedics;  Laterality: Left;   Left 4th toe  amputation     LEFT AND RIGHT HEART CATHETERIZATION WITH CORONARY ANGIOGRAM N/A 11/26/2014   Procedure: LEFT AND RIGHT HEART CATHETERIZATION WITH CORONARY ANGIOGRAM;  Surgeon: Leonie Man, MD;  Location: [facility] LAB;  Service: Cardiovascular;  Laterality: N/A;   LOWER EXTREMITY ANGIOGRAPHY Bilateral 05/04/2018   Procedure: Lower Extremity Angiography;  Surgeon: Conrad Palmyra, MD;  Location: [place] [facility];  Service: Cardiovascular;  Laterality: Bilateral;   LOWER  EXTREMITY ANGIOGRAPHY Left 05/11/2018   Procedure: LOWER EXTREMITY ANGIOGRAPHY;  Surgeon: Conrad Plainfield, MD;  Location: [place] [facility];  Service: Cardiovascular;  Laterality: Left;   PERIPHERAL VASCULAR BALLOON ANGIOPLASTY Left 05/11/2018   Procedure: PERIPHERAL VASCULAR BALLOON ANGIOPLASTY;  Surgeon: Conrad Redstone, MD;  Location: [place] [facility];  Service: Cardiovascular;  Laterality: Left;  Posterior tibial   PERIPHERAL VASCULAR INTERVENTION Left 05/11/2018   Procedure: PERIPHERAL VASCULAR INTERVENTION;  Surgeon: Conrad , MD;  Location: [place] [facility];  Service: Cardiovascular;  Laterality: Left;  tibioperoneal trunk   REVISION OF ARTERIOVENOUS GORETEX GRAFT Left 06/19/2019   Procedure: BANDING OF ARTERIOVENOUS FISTULA LEFT ARM;  Surgeon: Angelia Mould, MD;  Location: [place];  Service: Vascular;  Laterality: Left;   TEE WITHOUT CARDIOVERSION N/A 11/20/2018   Procedure: TRANSESOPHAGEAL ECHOCARDIOGRAM (TEE);  Surgeon: Jerline Pain, MD;  Location: [facility] OR;  Service: Cardiovascular;  Laterality: N/A;   TONSILLECTOMY      Allergies  Allergies  Allergen Reactions   Penicillins Hives, Swelling and Other (See Comments)    Ankle swelling Has patient had a PCN reaction causing immediate rash, facial/tongue/throat swelling, SOB or lightheadedness with hypotension: Yes Has patient had a PCN reaction causing severe rash involving mucus membranes or skin necrosis: No Has patient had a PCN reaction that required hospitalization: No Has patient had a PCN reaction occurring within the last 10 years: No If all of the above answers are "NO", then may proceed with Cephalosporin use.    History of Present Illness    Mr. Colson has a past medical history of end-stage renal disease on dialysis, insulin-dependent diabetes, hypertension, treated dyslipidemia, nonischemic cardiomyopathy with normal coronaries and November 2016.  Echocardiogram 3/19 showed an EF of 50-55% with basilar  hypertrophy and moderate LVH.  He had significant PVD and had nonhealing ulcers on bilateral feet.  He was followed by Dr. [name] at the wound center.  5/19 he underwent left tibioperoneal trunk angioplasty by Dr. Donzetta Matters.  He is also followed by Dr. Doren Custard for his foot wounds.  He was seen by Kerin Ransom, PA-C on 10/31/2018 on referral from Dr. Marval Regal for an episode of orthostatic syncope and near syncope.  These episodes were postdialysis however occurred on nondialysis days as well.  He stated he could tell when he was going to pass out and had only collapsed 1 time at that point.  He denied tachycardia.  His Midrin was increased to 10 mg 3 times daily.  He presented to the clinic 04/22/2020 for follow-up evaluation and stated he had stopped his midodrine due to side effects.  He stated he had continued to have issues regulating his blood pressure.  Nephrology had been working on adjusting his dry weight and his fluid  during HD.  He had no cardiac complaints  and stated that he closely watched his diet and fluid intake.  He did state that he wished he was able to drink more.  He had a right foot wound that was being treated by  the wound center.  He continued to be hopeful that he may receive a kidney.  He  had no further episodes of syncope.  Follow-up was planned for 12 months.  He presents to the clinic today for follow-up evaluation states he feels well.  He does report that he had elevated blood pressure during some dialysis treatments and nephrology placed him on carvedilol twice daily.  Since that time he has not noted any headaches upon waking up in the morning.  He continues to follow a strict heart healthy/renal diet and monitor his fluid intake.  He is limited in his physical activity due to his left lower leg prosthesis.  He requests that we order an echocardiogram and nuclear stress test for eligibility for renal transplant.  I will continue his current medication regimen, and diet.  We will  plan follow-up in 9 to 12 months.  Today he denies chest pain, shortness of breath, lower extremity edema, fatigue, palpitations, melena, hematuria, hemoptysis, diaphoresis, weakness, presyncope, syncope, orthopnea, and PND.   Home Medications    Prior to Admission medications   Medication Sig Start Date End Date Taking? Authorizing Provider  acetaminophen (TYLENOL) 325 MG tablet Take 650 mg by mouth every 6 (six) hours as needed (pain).     [provider]  atorvastatin (LIPITOR) 40 MG tablet Take 40 mg by mouth at bedtime.     [provider]  Continuous Blood Gluc Sensor (FREESTYLE LIBRE 14 DAY SENSOR) MISC 1 Cartridge by Does not apply route every 14 (fourteen) days. 12/01/18   Love, Ivan Anchors, PA-C  doxycycline (VIBRA-TABS) 100 MG tablet Take 100 mg by mouth 2 (two) times daily.    [provider]  ferric citrate (AURYXIA) 1 GM 210 MG(Fe) tablet Take 420 mg by mouth 3 (three) times daily with meals.     [provider]  glucose monitoring kit (FREESTYLE) monitoring kit 1 each by Does not apply route 4 (four) times daily - after meals and at bedtime. 1 month Diabetic Testing Supplies for QAC-QHS accuchecks.Any brand OK 11/29/14   Thurnell Lose, MD  hydrocerin (EUCERIN) CREA Apply 1 application topically 2 (two) times daily. To right leg and Patient taking differently: Apply 1 application topically daily. To right leg and 12/01/18   Love, Ivan Anchors, PA-C  insulin NPH-regular Human (NOVOLIN 70/30) (70-30) 100 UNIT/ML injection Inject 5-11 Units into the skin 3 (three) times daily before meals. Sliding Scale - based on CBG    [provider]  Insulin Syringe-Needle U-100 25G X 1" 1 ML MISC Any brand, for 4 times a day insulin SQ, 1 month supply. 11/29/14   Thurnell Lose, MD  multivitamin (RENA-VIT) TABS tablet Take 1 tablet by mouth daily. 01/09/19   [provider]  mupirocin ointment (BACTROBAN) 2 % Apply 1 application topically daily as  needed (rassh).  08/05/16   [provider]  oxyCODONE (ROXICODONE) 5 MG immediate release tablet Take 1 tablet (5 mg total) by mouth every 4 (four) hours as needed. Patient not taking: Reported on 07/27/2019 06/19/19   Angelia Mould, MD  primidone (MYSOLINE) 50 MG tablet Take 1 tablet (50 mg total) by mouth at bedtime. 12/01/18   Love, Ivan Anchors, PA-C  timolol (TIMOPTIC) 0.5 % ophthalmic solution Place 1 drop into the left eye every morning.     [provider]    Family History    Family History  Problem Relation Age of Onset   Heart disease Mother  Diabetes Mother    Diabetes Father    Diabetes Brother    Diabetes Sister    Diabetes Brother    Diabetes Brother    He indicated that his mother is deceased. He indicated that his father is deceased. He indicated that the status of his sister is unknown. He indicated that one of his four brothers is deceased. He indicated that his maternal grandmother is deceased. He indicated that his maternal grandfather is deceased. He indicated that his paternal grandmother is deceased. He indicated that his paternal grandfather is deceased. He indicated that both of his daughters are alive. He indicated that his son is alive.  Social History    Social History   Socioeconomic History   Marital status: Married    Spouse name: [name]   Number of children: 3   Years of education: 41 years - college   Highest education level: Not on file  Occupational History   Occupation: Retired    Fish farm manager: [employer]  Tobacco Use   Smoking status: Former    Packs/day: 0.50    Years: 4.00    Pack years: 2.00    Types: Cigarettes   Smokeless tobacco: Never   Tobacco comments:    about 40 yrs ago  Vaping Use   Vaping Use: Never used  Substance and Sexual Activity   Alcohol use: Not Currently    Alcohol/week: 0.0 standard drinks   Drug use: Not Currently   Sexual activity: Yes  Other Topics Concern   Not on file  Social  History Narrative   Lives at home with his wife, [name].   Right-handed.   2 cups caffeine per day.   Social Determinants of Health   Financial Resource Strain: Not on file  Food Insecurity: Not on file  Transportation Needs: Not on file  Physical Activity: Not on file  Stress: Not on file  Social Connections: Not on file  Intimate Partner Violence: Not on file     Review of Systems    General:  No chills, fever, night sweats or weight changes.  Cardiovascular:  No chest pain, dyspnea on exertion, edema, orthopnea, palpitations, paroxysmal nocturnal dyspnea. Dermatological: No rash, lesions/masses Respiratory: No cough, dyspnea Urologic: No hematuria, dysuria Abdominal:   No nausea, vomiting, diarrhea, bright red blood per rectum, melena, or hematemesis Neurologic:  No visual changes, wkns, changes in mental status. All other systems reviewed and are otherwise negative except as noted above.  Physical Exam    VS:  BP 132/78 (BP Location: Right Arm)    Pulse 81    Ht _0  (1.854 m)    Wt 217 lb 6.4 oz (98.6 kg)    SpO2 99%    BMI 28.68 kg/m  , BMI Body mass index is 28.68 kg/m. GEN: Well nourished, well developed, in no acute distress. HEENT: normal. Neck: Supple, no JVD, carotid bruits, or masses. Cardiac: RRR, no murmurs, rubs, or gallops. No clubbing, cyanosis, edema.  Radials/DP/PT 2+ and equal bilaterally.  Respiratory:  Respirations regular and unlabored, clear to auscultation bilaterally. GI: Soft, nontender, nondistended, BS + x 4. MS: no deformity or atrophy. Skin: warm and dry, no rash. Neuro:  Strength and sensation are intact. Psych: Normal affect.  Accessory Clinical Findings    ECG personally reviewed by me today-normal sinus rhythm left ventricular hypertrophy with QRS widening T wave abnormality consider lateral ischemia 81 bpm  EKG 04/22/2020  normal sinus rhythm nonspecific intraventricular conduction delay 89 bpm- No acute  changes  EKG  11/20/2018 Sinus rhythm left bundle branch block 98 bpm  Echocardiogram 11/20/2018 Study Conclusions   - Left ventricle: Systolic function was normal. The estimated    ejection fraction was in the range of 60% to 65%.  - Aortic valve: Mildly calcified leaflets (non coronary leaflet    tip). No evidence of vegetation.  - Mitral valve: No evidence of vegetation.  - Left atrium: No evidence of thrombus in the appendage.  - Right atrium: No evidence of thrombus in the atrial cavity or    appendage.  - Atrial septum: No defect or patent foramen ovale was identified.  - Tricuspid valve: No evidence of vegetation.  - Pulmonic valve: No evidence of vegetation.   Assessment & Plan   1.  Congestive dilated cardiomyopathy-euvolemic today.  No increased DOE or activity intolerance.  Echocardiogram 11/19 showed an EF of 60-65%, mildly calcified aortic valve and no other significant abnormalities. Continue carvedilol Heart healthy low-sodium diet-salty 6 diet reviewed Increase physical activity as tolerated  Peripheral arterial disease-denies claudication.  Tibial-peroneal trunk angioplasty 5/19 by Dr. Donzetta Matters.  Foot care managed by wound center.  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 Inspect feet daily-reviewed  End-stage renal disease-on HD.  Tolerating HD well.  Needs evaluation for renal transplant including echo and nuclear stress test Continue renal diet Continue to monitor fluids Followed by Dr. Marlaine Hind nephrology Order Echo and nuclear stress test  Shared Decision Making/Informed Consent The risks [chest pain, shortness of breath, cardiac arrhythmias, dizziness, blood pressure fluctuations, myocardial infarction, stroke/transient ischemic attack, nausea, vomiting, allergic reaction, radiation exposure, metallic taste sensation and life-threatening complications (estimated to be 1 in 10,000)], benefits (risk stratification, diagnosing  coronary artery disease, treatment guidance) and alternatives of a nuclear stress test were discussed in detail with Mr. Arroyave and he agrees to proceed.   Essential hypertension-BP today 132/78.  Did note elevated blood pressure over the last couple months.  Nephrology started patient on carvedilol Continue carvedilol Heart healthy low-sodium diet Increase physical activity as tolerated Maintain blood pressure log  Syncope-denies recent episodes of presyncope, syncope, lightheadedness, and dizziness.    Previously had orthostatic symptoms and collapse.  Was noted after dialysis and on nondialysis days as well.  Previously took midodrine but discontinued due to side effects. Lower extremity support stockings Move from laying to sitting slowly and sitting to standing slowly.  Allow 1-2 minutes before ambulation. Continue to monitor   Disposition: Follow-up with Dr. Stanford Breed in 9-12 months.  Jossie Ng. Bonnee Zertuche NP-C    12/10/2021, 9:27 AM [place] [address] Office [phone] Fax [phone]

## 2021-12-10 ENCOUNTER — Other Ambulatory Visit: Payer: Self-pay

## 2021-12-10 ENCOUNTER — Ambulatory Visit (INDEPENDENT_AMBULATORY_CARE_PROVIDER_SITE_OTHER): Payer: Medicare Other | Admitting: General Practice

## 2021-12-10 ENCOUNTER — Encounter: Payer: Self-pay | Admitting: General Practice

## 2021-12-10 VITALS — BP 132/78 | HR 81 | Ht 73.0 in | Wt 217.4 lb

## 2021-12-10 DIAGNOSIS — Z992 Dependence on renal dialysis: Secondary | ICD-10-CM

## 2021-12-10 DIAGNOSIS — R55 Syncope and collapse: Secondary | ICD-10-CM

## 2021-12-10 DIAGNOSIS — I420 Dilated cardiomyopathy: Secondary | ICD-10-CM | POA: Diagnosis not present

## 2021-12-10 DIAGNOSIS — I10 Essential (primary) hypertension: Secondary | ICD-10-CM

## 2021-12-10 DIAGNOSIS — N186 End stage renal disease: Secondary | ICD-10-CM

## 2021-12-10 DIAGNOSIS — I739 Peripheral vascular disease, unspecified: Secondary | ICD-10-CM

## 2021-12-10 NOTE — Patient Instructions (Signed)
Medication Instructions:  The current medical regimen is effective;  continue present plan and medications as directed. Please refer to the Current Medication list given to you today.  *If you need a refill on your cardiac medications before your next appointment, please call your pharmacy*  Lab Work: NONE If you have labs (blood work) drawn today and your tests are completely normal, you will receive your results only by:  [place] (if you have MyChart) OR A paper copy in the mail.  If you have any lab test that is abnormal or we need to change your treatment, we will call you to review the results. You may go to any [facility] that is convenient for you however, we do have a lab in our office that is able to assist you. You DO NOT need an appointment for our lab. The lab is open 8:00am and closes at 4:00pm. Lunch 12:45 - 1:45pm.  Testing/Procedures: Echocardiogram - Your physician has requested that you have an echocardiogram. Echocardiography is a painless test that uses sound waves to create images of your heart. It provides your doctor with information about the size and shape of your heart and how well your hearts chambers and valves are working. This procedure takes approximately one hour. There are no restrictions for this procedure. This will be performed at either our [facility] location - [address], [place] location [facility] 2nd floor.  Your physician has requested that you have a lexiscan myoview. For further information please visit [email or web address]. Please follow instruction sheet, as given.  Special Instructions PLEASE INCREASE PHYSICAL ACTIVITY AS TOLERATED  Follow-Up: Your next appointment:  1 year(s) In Person with Kirk Ruths, MD   Please call our office 2 months in advance to schedule this appointment  At [facility], you and your health needs are our priority.  As part of our continuing mission to provide you with  exceptional heart care, we have created designated Provider Care Teams.  These Care Teams include your primary Cardiologist (physician) and Advanced Practice Providers (APPs -  Physician Assistants and Nurse Practitioners) who all work together to provide you with the care you need, when you need it.  We recommend signing up for the patient portal called "MyChart".  Sign up information is provided on this After Visit Summary.  MyChart is used to connect with patients for Virtual Visits (Telemedicine).  Patients are able to view lab/test results, encounter notes, upcoming appointments, etc.  Non-urgent messages can be sent to your provider as well.   To learn more about what you can do with MyChart, go to [email or web address].

## 2021-12-15 ENCOUNTER — Inpatient Hospital Stay (HOSPITAL_COMMUNITY): Admission: RE | Admit: 2021-12-15 | Payer: Medicare Other | Source: Ambulatory Visit

## 2021-12-15 ENCOUNTER — Telehealth (HOSPITAL_COMMUNITY): Payer: Self-pay | Admitting: *Deleted

## 2021-12-15 NOTE — Telephone Encounter (Signed)
Close encounter 

## 2021-12-16 NOTE — Addendum Note (Signed)
Addended by: Deberah Pelton on: 12/16/2021 11:28 AM   Modules accepted: Orders

## 2021-12-17 ENCOUNTER — Other Ambulatory Visit: Payer: Self-pay

## 2021-12-17 ENCOUNTER — Ambulatory Visit (HOSPITAL_COMMUNITY)
Admission: RE | Admit: 2021-12-17 | Discharge: 2021-12-17 | Disposition: A | Discharge status: Home / Self Care | Payer: Medicare Other | Source: Ambulatory Visit | Attending: Cardiovascular Disease | Admitting: Cardiovascular Disease

## 2021-12-17 DIAGNOSIS — I420 Dilated cardiomyopathy: Secondary | ICD-10-CM | POA: Insufficient documentation

## 2021-12-17 MED ORDER — REGADENOSON 0.4 MG/5ML IV SOLN
0.4000 mg | Freq: Once | INTRAVENOUS | Status: AC
Start: 2021-12-17 — End: 2021-12-17
  Administered 2021-12-17: 0.4 mg via INTRAVENOUS

## 2021-12-17 MED ORDER — TECHNETIUM TC 99M TETROFOSMIN IV KIT
30.6000 | PACK | Freq: Once | INTRAVENOUS | Status: AC | PRN
  Administered 2021-12-17: 30.6 via INTRAVENOUS
  Filled 2021-12-17: qty 31

## 2021-12-17 MED ORDER — TECHNETIUM TC 99M TETROFOSMIN IV KIT
10.8000 | PACK | Freq: Once | INTRAVENOUS | Status: AC | PRN
  Administered 2021-12-17: 10.8 via INTRAVENOUS
  Filled 2021-12-17: qty 11

## 2021-12-18 LAB — MYOCARDIAL PERFUSION IMAGING
LV dias vol: 203 mL (ref 62–150)
LV sys vol: 120 mL
Nuc Stress EF: 41 %
Peak HR: 77 {beats}/min
Rest HR: 66 {beats}/min
Rest Nuclear Isotope Dose: 10.8 mCi
SDS: 1
SRS: 1
SSS: 2
ST Depression (mm): 0 mm
Stress Nuclear Isotope Dose: 30.6 mCi
TID: 1.03

## 2022-01-07 ENCOUNTER — Ambulatory Visit (HOSPITAL_COMMUNITY): Payer: Medicare Other | Attending: Cardiovascular Disease

## 2022-01-07 ENCOUNTER — Other Ambulatory Visit: Payer: Self-pay

## 2022-01-07 DIAGNOSIS — I420 Dilated cardiomyopathy: Secondary | ICD-10-CM | POA: Diagnosis present

## 2022-01-07 LAB — ECHOCARDIOGRAM COMPLETE
Area-P 1/2: 4.49 cm2
S' Lateral: 4 cm

## 2022-02-01 ENCOUNTER — Other Ambulatory Visit: Payer: Self-pay | Admitting: Nephrology

## 2022-02-01 DIAGNOSIS — R29810 Facial weakness: Secondary | ICD-10-CM

## 2022-02-23 ENCOUNTER — Ambulatory Visit
Admission: RE | Admit: 2022-02-23 | Discharge: 2022-02-23 | Disposition: A | Discharge status: Home / Self Care | Payer: Medicare Other | Source: Ambulatory Visit | Attending: Nephrology | Admitting: Nephrology

## 2022-02-23 ENCOUNTER — Other Ambulatory Visit: Payer: Self-pay

## 2022-02-23 DIAGNOSIS — R29810 Facial weakness: Secondary | ICD-10-CM

## 2022-03-18 ENCOUNTER — Other Ambulatory Visit: Payer: Self-pay | Admitting: Nephrology

## 2022-03-18 DIAGNOSIS — R109 Unspecified abdominal pain: Secondary | ICD-10-CM

## 2022-04-15 ENCOUNTER — Other Ambulatory Visit: Payer: Medicare Other

## 2024-02-09 ENCOUNTER — Telehealth: Payer: Self-pay | Admitting: *Deleted

## 2024-02-09 NOTE — Telephone Encounter (Signed)
Lt pt voice mail. Pt needs to sigh a release form.

## 2024-02-23 ENCOUNTER — Ambulatory Visit (INDEPENDENT_AMBULATORY_CARE_PROVIDER_SITE_OTHER): Payer: Medicare Other | Admitting: Neurology

## 2024-02-23 VITALS — BP 142/88 | HR 78 | Ht 73.0 in | Wt 211.4 lb

## 2024-02-23 DIAGNOSIS — G250 Essential tremor: Secondary | ICD-10-CM | POA: Diagnosis not present

## 2024-02-23 NOTE — Progress Notes (Signed)
 Assessment/Plan:   1.  Essential Tremor, severe and debilitating.  - I do not think he would be a DBS candidate given end-stage renal disease, coronary artery disease and history of multiple stump related infections requiring IV antibiotics post surgeries.  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  However, his tremor is very severe and he has trouble pulling up his insulin, and I do think that we need to look at his options. In addition, last visit, his L hand was the primary issue but now its the R and he is R handed.   His tremor is markedly worse than when I saw him 3 years ago.  I am going to send him to Dr. Carlyn Reichert to see if he is a focused ultrasound candidate.  I will reach out to Dr. Carlyn Reichert personally as well.  I gave patient literature on the procedure.    Subjective:   [name] [name] Sr. was seen in consultation in the movement disorder clinic at the request of Knox Royalty, MD.  The evaluation is for tremor.  I saw the patient in 2022 for the same.  He was seen in February, 2022 by Dr. Terrace Arabia and a few weeks later was seen by myself for the same issue.  He was already on primidone, 100 mg, when he saw Dr. Terrace Arabia and had been on that for 2 years.  She recommended that he discontinue it because he felt somewhat restless with it before falling asleep and he was not sure if he was having dreams or hallucinations when going to sleep.  He was not sure it was helpful, so the medication was discontinued.  I discussed with him that we could go back up on it and actually take it during the daytime (previously was only taking it at night).  We also discussed low-dose topiramate (given after dialysis), but discussed that it likely would not have much impact on tremor at low dosages.  We discussed surgical interventions, but felt he was likely not a DBS candidate given his multiple medical problems, including the fact that he had had many  infections related to his stump requiring IV antibiotics.  He is end-stage renal disease on dialysis and has coronary artery disease.  We have talked about focused ultrasound, but at the time it was not easily available in our area, but also talked about the risk of loss of balance and he already had a left BKA.  He is sent back by [facility] for another evaluation.  Patient reports that tremor is becoming worse and more disabling.  He has more trouble drawing up his insulin.  He has trouble with eating and writing (can't write with the right hand and has trouble with the L).  His mother had tremor as well.  He reports he is back on primidone 50 mg, 2 at bed.  He has tried to take 3 at night and a "couple in the day" and its hard because "I'm a dialysis patient and then its all gone."   Trouble buttoning shirts and tying shoes.   He has been back on this since not long after I saw him.  He is again having trouble with figuring out if he is having a dream or hallucinations when going to sleep.    Affected by caffeine:  No. Affected by alcohol:  Yes, but doesn't drink a lot Affected by stress:  Yes.   Affected by  fatigue:  Yes.  , just physical fatigue Spills soup if on spoon:  Yes.   , has to hold the spoon with both hands Spills glass of liquid if full:  Yes.  , some days Affects ADL's (tying shoes, brushing teeth, etc):  No.  Current/Previously tried tremor medications: Tramadol, methocarbamol (sleepy), primidone, 50 mg, 2 tablets at bedtime   Outside reports reviewed: historical medical records, office notes and referral letter/letters.  Allergies  Allergen Reactions   Penicillins Hives, Swelling and Other (See Comments)    Ankle swelling Has patient had a PCN reaction causing immediate rash, facial/tongue/throat swelling, SOB or lightheadedness with hypotension: Yes Has patient had a PCN reaction causing severe rash involving mucus membranes or skin necrosis: No Has patient had a PCN reaction  that required hospitalization: No Has patient had a PCN reaction occurring within the last 10 years: No If all of the above answers are "NO", then may proceed with Cephalosporin use.    Current Outpatient Medications  Medication Instructions   acetaminophen (TYLENOL) 650 mg, Every 6 hours PRN   Auryxia 420 mg, 3 times daily with meals   insulin NPH-regular Human (NOVOLIN 70/30) (70-30) 100 UNIT/ML injection 5-11 Units, 3 times daily before meals   multivitamin (RENA-VIT) TABS tablet 1 tablet, Daily   mupirocin ointment (BACTROBAN) 2 % 1 application , Daily PRN   oxyCODONE (ROXICODONE) 5 mg, Oral, Every 4 hours PRN   primidone (MYSOLINE) 100 mg, Oral, Daily at bedtime   timolol (TIMOPTIC) 0.5 % ophthalmic solution 1 drop, BH-each morning     Objective:   VITALS:   Vitals:   02/23/24 0942  BP: (!) 142/88  Pulse: 78  SpO2: 98%  Weight: 211 lb 6.4 oz (95.9 kg)  Height: 6\' 1"  (1.854 m)    Gen:  Appears stated age and in NAD. HEENT:  Normocephalic, atraumatic. The mucous membranes are moist. The superficial temporal arteries are without ropiness or tenderness. Cardiovascular: Regular rate and rhythm. Lungs: Clear to auscultation bilaterally. Neck: There are no carotid bruits noted bilaterally.  NEUROLOGICAL:  Orientation:  The patient is alert and oriented x 3.   Cranial nerves: There is good facial symmetry. Extraocular muscles are intact and visual fields are full to confrontational testing. Speech is fluent and clear. Soft palate rises symmetrically and there is no tongue deviation. Hearing is intact to conversational tone. Tone: Tone is good throughout. Coordination:  The patient has no dysdiadichokinesia or dysmetria. Motor: Strength is 5/5 in the bilateral upper and right lower extremities.  He has a left BKA.  Shoulder shrug is equal bilaterally.  There is no pronator drift.  There are no fasciculations noted. Gait and Station: The patient ambulates fairly well in the  room.  He has the cane but doesn't use it.  He is a bit antalgic b/c of the prosthesis.    MOVEMENT EXAM: Tremor:  There is  tremor in the UE, noted most significantly with action.  Tremor on the right is more severe than that of the left, which is different than last time, but both sides are significant.  Right postural tremor is severe and mod to severe on the L Today:  Today  01/2021:  01/2021:   Total time spent on today's visit was 60 minutes, including both face-to-face time and nonface-to-face time.  Time included that spent on review of records (prior notes available to me/labs/imaging if pertinent), discussing treatment and goals, answering patient's questions and coordinating care.  CC:  Knox Royalty,  MD

## 2024-02-24 ENCOUNTER — Other Ambulatory Visit: Payer: Self-pay

## 2024-02-24 DIAGNOSIS — G250 Essential tremor: Secondary | ICD-10-CM

## 2024-07-31 ENCOUNTER — Ambulatory Visit (INDEPENDENT_AMBULATORY_CARE_PROVIDER_SITE_OTHER): Admitting: Podiatry

## 2024-07-31 DIAGNOSIS — L97512 Non-pressure chronic ulcer of other part of right foot with fat layer exposed: Secondary | ICD-10-CM | POA: Diagnosis not present

## 2024-07-31 MED ORDER — MUPIROCIN 2 % EX OINT: 1.0000 | TOPICAL_OINTMENT | Freq: Two times a day (BID) | CUTANEOUS | 2 refills | Status: AC

## 2024-07-31 NOTE — Progress Notes (Signed)
 Patient was with follow-up area that he thinks might be developing ulcer on the plantar right foot.  No F/C or N/V.  Has noticed a little bit of clear drainage but no purulence.  Has not noticed any red areas on the foot or significant swelling.  Physical Exam:  Patient alert and oriented x 3.  No complaints of nausea, vomiting, fever, or chills  Vascular: DP pulses 2/4 bilateral. PT pulses 1/4 lateral.  Moderate right leg.   Dermatologic: Full-thickness ulceration penetrating the subcutaneous tissue plantar right foot.  Ulcer penetrates into the subcutaneous tissue.  Some granulation tissue present.  Some clear drainage but no signs of infection.. Measures 4 mm wide x 3 mm long x 4mm deep.   Neurologic: Decree sensation feet bilaterally  Musculoskeletal: Status post BKA amp left and digits 3 through 5 right and partial amputation second toe right    Diagnoses: 1.  Full-thickness ulceration Wagner grade 2 right forefoot.  Plan: -Discussed with him the ulceration that he has developed.  Discussed etiology and treatment.  Watch very closely for any signs of infection.  If he notices any redness or increased swelling in the foot or purulent drainage he should call immediately -Sharp debridement full-thickness ulceration plantar right forefoot.  Sharp debrided the devitalized tissue in the subcutaneous tissue.  Applied antibiotic ointment and a light dressing.  - Wound care: Soak foot warm salt water 15 minutes twice daily, apply Bactroban  ointment, and a light dressing. -Rx Bactroban  ointment, apply to wounds twice daily cover with a light dressing.  -Rx diabetic shoes and custom insoles bilaterally, dispense 1 pair each   Return 1 f/u ulcer and 3 months [facility]

## 2024-07-31 NOTE — Patient Instructions (Signed)

## 2024-08-09 ENCOUNTER — Ambulatory Visit (INDEPENDENT_AMBULATORY_CARE_PROVIDER_SITE_OTHER): Admitting: Podiatry

## 2024-08-09 DIAGNOSIS — L97512 Non-pressure chronic ulcer of other part of right foot with fat layer exposed: Secondary | ICD-10-CM | POA: Diagnosis not present

## 2024-08-09 NOTE — Progress Notes (Addendum)
 Patient presents follow-up ulcer right foot.  Doing a surgical shoe and doing wound care as instructed.  No fever chills or nausea or vomiting.  Physical Exam:  Patient alert and oriented x 3.  No complaints of nausea, vomiting, fever, or chills  Vascular: DP pulses 2/4 bilateral. PT pulses 1 daycare/4 lateral.  Mild edema.   Dermatologic: Full-thickness ulceration plantar forefoot right penetrating the subcutaneous tissue.  Minimal undermining.  Mild clear drainage.  No purulence or signs of infection.. Measures 2 mm wide x 2 mm long x 3mm deep.    Neurologic:   Musculoskeletal: Status post amputation toes 3 through 5 right and partial amputation second toe right    Diagnoses: 1.  Full-thickness Wagner grade 2 ulcer plantar right foot-improved..  Plan: -Continue wound care soaking daily and warm salt water for 15 minutes, apply Bactroban  ointment and a light dressing.  Wear surgical shoe for all weightbearing. -Sharp debridement full-thickness ulceration metatarsal right foot debrided any devitalized tissue in the subcutaneous tissue.  Applied antibiotic ointment and a light   Return 2 weeks f/u ulcer right

## 2024-08-23 ENCOUNTER — Encounter: Payer: Self-pay | Admitting: Podiatry

## 2024-08-23 ENCOUNTER — Ambulatory Visit (INDEPENDENT_AMBULATORY_CARE_PROVIDER_SITE_OTHER): Admitting: Podiatry

## 2024-08-23 DIAGNOSIS — M722 Plantar fascial fibromatosis: Secondary | ICD-10-CM

## 2024-08-23 NOTE — Progress Notes (Signed)
 CMA Toya called RN to room for patient complaints of chills and feeling feverish. Upon arrival to room patient reports feeling feverish, having some chills, cough, headache, fatigue and diarrhea since dialysis on Monday. Vitals at 442 pm were: BP 142/70, HR 78, RR 16, O2 100, Oral Temp 99.7, FSBS 145. Patient is IDDM and has eaten and had fluids today. Denies nausea/vomiting/shortness of breath/chest pain. Provider evaluated and recommended to send to UC after checking out from appointment. RN reiterated importance of being seen for current symptoms. Patient verbalized understanding.

## 2024-08-23 NOTE — Progress Notes (Signed)
 Patient presents for follow-up ulceration right foot.  He says it is doing well has not had any drainage seems like it.  He does got however complain of fever and chills and nausea.  Does have a wound of the lower leg which dermatologist is treating.  Does complain of pain along the arch of the right foot.   Physical exam:  General appearance: Pleasant, and in no acute distress. AOx3.  Vascular: Pedal pulses: DP 2/4 bilaterally, PT 1/4 bilaterally.  Mild to moderate edema lower leg leg right.   Neurological:   Dermatologic:   Ulcer plantar right foot is completely healed.  No signs of infection.  Skin normal temperature bilaterally.  Skin thin and atrophic with no hair growth bilateral lower extremity right.  Areas of hyperpigmentation.  Wound is being treated by his dermatologist on the medial aspect of the lower lower leg right with a dressing on it.  Under the dressing the ulcer looks healthy with no signs of  Musculoskeletal: Some tenderness along the mid band of the plantar fascial right.  No soft tissue swelling or tightness or redness located in the area..  Status post amputation toes 3 through 5 right and partial amputation second toe right.  Status post BKA left     Diagnosis: 1.  Plantar fasciitis right  Plan: -Established office visit for evaluation and management.  Level 3. - Ulcer is healed so he can discontinue any wound care at this point.  Will write him a prescription for diabetic shoes and custom insoles for the right foot. -Discussed some the fever chills and nausea he has been experiencing.  Recommend he go to an urgent care to get this evaluated.  There is no signs of any of the lower leg right being infected and no evidence evidence in right foot with ulcers being healed -Rx given for diabetic shoes and custom insoles 1 pair.  Patient is a type II diabetic with history of BKA on the left, ulcers on the right, and amputation toes 2 3 and 4 right.  Return as  needed

## 2024-08-24 ENCOUNTER — Other Ambulatory Visit: Payer: Self-pay | Admitting: Podiatry

## 2024-08-24 NOTE — Addendum Note (Signed)
 Addended by: GERRIT ANDREZ CROME on: 08/24/2024 09:48 AM   Modules accepted: Orders

## 2024-10-23 ENCOUNTER — Encounter (HOSPITAL_COMMUNITY): Payer: Self-pay

## 2024-10-25 ENCOUNTER — Ambulatory Visit (HOSPITAL_COMMUNITY)
Admission: RE | Admit: 2024-10-25 | Discharge: 2024-10-25 | Disposition: A | Discharge status: Home / Self Care | Attending: Surgery | Admitting: Surgery

## 2024-10-25 ENCOUNTER — Encounter (HOSPITAL_COMMUNITY): Payer: Self-pay | Admitting: Surgery

## 2024-10-25 ENCOUNTER — Other Ambulatory Visit: Payer: Self-pay

## 2024-10-25 ENCOUNTER — Encounter (HOSPITAL_COMMUNITY)
Admission: RE | Disposition: A | Discharge status: Home / Self Care | Payer: Self-pay | Source: Home / Self Care | Attending: Surgery

## 2024-10-25 DIAGNOSIS — N186 End stage renal disease: Secondary | ICD-10-CM | POA: Insufficient documentation

## 2024-10-25 DIAGNOSIS — Y832 Surgical operation with anastomosis, bypass or graft as the cause of abnormal reaction of the patient, or of later complication, without mention of misadventure at the time of the procedure: Secondary | ICD-10-CM | POA: Insufficient documentation

## 2024-10-25 DIAGNOSIS — Z992 Dependence on renal dialysis: Secondary | ICD-10-CM | POA: Diagnosis not present

## 2024-10-25 DIAGNOSIS — I120 Hypertensive chronic kidney disease with stage 5 chronic kidney disease or end stage renal disease: Secondary | ICD-10-CM | POA: Diagnosis not present

## 2024-10-25 DIAGNOSIS — E1122 Type 2 diabetes mellitus with diabetic chronic kidney disease: Secondary | ICD-10-CM | POA: Diagnosis not present

## 2024-10-25 DIAGNOSIS — T82898A Other specified complication of vascular prosthetic devices, implants and grafts, initial encounter: Secondary | ICD-10-CM

## 2024-10-25 DIAGNOSIS — Z87891 Personal history of nicotine dependence: Secondary | ICD-10-CM | POA: Diagnosis not present

## 2024-10-25 DIAGNOSIS — T82858A Stenosis of vascular prosthetic devices, implants and grafts, initial encounter: Secondary | ICD-10-CM | POA: Insufficient documentation

## 2024-10-25 HISTORY — PX: A/V FISTULAGRAM: CATH118298

## 2024-10-25 HISTORY — PX: VENOUS ANGIOPLASTY: CATH118376

## 2024-10-25 MED ORDER — HEPARIN (PORCINE) IN NACL 1000-0.9 UT/500ML-% IV SOLN
INTRAVENOUS | Status: DC | PRN
  Administered 2024-10-25: 500 mL

## 2024-10-25 MED ORDER — IODIXANOL 320 MG/ML IV SOLN
INTRAVENOUS | Status: DC | PRN
Start: 2024-10-25 — End: 2024-10-25
  Administered 2024-10-25: 45 mL via INTRAVENOUS

## 2024-10-25 MED ORDER — LIDOCAINE HCL (PF) 1 % IJ SOLN
INTRAMUSCULAR | Status: AC
Start: 2024-10-25 — End: 2024-10-25
  Filled 2024-10-25: qty 30

## 2024-10-25 MED ORDER — LIDOCAINE HCL (PF) 1 % IJ SOLN
INTRAMUSCULAR | Status: DC | PRN
Start: 2024-10-25 — End: 2024-10-25
  Administered 2024-10-25: 5 mL

## 2024-10-25 NOTE — Op Note (Signed)
    Patient name: [name] FAILLA Sr. MRN: [id number] DOB: [birth year] Sex: male  10/25/2024 Pre-operative Diagnosis: ESRD Post-operative diagnosis:  Same Surgeon:  Malvina New Procedure Performed:  1.  Ultrasound-guided access left cephalic vein fistula x 2  2.  Fistulogram  3.  Peripheral balloon venoplasty   Indications: This is a 73 year old gentleman with end-stage renal disease who has trouble with flow rates and bleeding.  He comes in today for fistulogram.  Procedure:  The patient was identified in the holding area and taken to room 8.  The patient was then placed supine on the table and prepped and draped in the usual sterile fashion.  A time out was called.  ltrasound was used to evaluate the fistula.  The vein was patent and compressible.  A digital ultrasound image was acquired.  The fistula was then accessed under ultrasound guidance using a micropuncture needle.  An 018 wire was then asvanced without resistance and a micropuncture sheath was placed.  Contrast injections were then performed through the sheath.  Findings: No evidence of central venous stenosis.  The fistula has aneurysmal changes in the midportion however no significant stenosis.  There did appear to be approximately a 70-80% stenosis at the level of the arteriovenous anastomosis   Intervention: Based off the location of the lesion, I had to get a second access.  The micropuncture was removed and a Monocryl used to close the site.  I then cannulated the fistula up near the upper arm under ultrasound guidance with a micropuncture needle.  A Glidewire advantage was placed and a 6 French sheath inserted.  The Glidewire was advanced out into the proximal brachial artery.  I first treated the area of stenosis with a 5 x 40 balloon.  Follow-up imaging showed suboptimal results and so I upsized to a 7 x 40 balloon and repeated balloon venoplasty.  I had full effacement of the balloon.  Residual stenosis was less than 15%.   Catheters and wires were removed.  The sheath access site was closed with a Monocryl.  Impression:  #1  Successful balloon venoplasty of a stenosis at the level of the arteriovenous anastomosis using a 7 x 40 balloon.   #2  Access remains amenable to future percutaneous interventions   V. Malvina New, M.D., [facility] [place] Office: [phone] Pager:  [phone]

## 2024-10-25 NOTE — H&P (Signed)
 [facility] [place]  Patient name: [name] CORNIA Sr. MRN: [id number] DOB: [birth year] Sex: male   REASON FOR VISIT:    ESRD  HISOTRY OF PRESENT ILLNESS:    [name]. is a 73 y.o. male with a history of left brachiocephalic fistula by Dr. Eliza in 2020.  He reportedly is having issues with his flow rates.  He also states that he has bleeding from the top access site.  He denies any arm swelling or hand pain.   PAST MEDICAL HISTORY:   Past Medical History:  Diagnosis Date   Cardiomyopathy (HCC)    Cataract    right eye   Diabetes mellitus    type 2   Diabetic foot ulcer (HCC)    Diabetic infection of right foot (HCC)    Diabetic peripheral neuropathy (HCC)    ESRD (end stage renal disease) (HCC)    m-w-f [facility] [address]   Glaucoma    left eye   Headache    HTN (hypertension)    reports not needed medication for 2 years   Hypotension    Osteomyelitis (HCC)    Peripheral vascular disease    critical limb ischemia   Tremor      FAMILY HISTORY:   Family History  Problem Relation Age of Onset   Heart disease Mother    Diabetes Mother    Diabetes Father    Diabetes Brother    Diabetes Sister    Diabetes Brother    Diabetes Brother     SOCIAL HISTORY:   Social History   Tobacco Use   Smoking status: Former    Current packs/day: 0.50    Average packs/day: 0.5 packs/day for 4.0 years (2.0 ttl pk-yrs)    Types: Cigarettes   Smokeless tobacco: Never   Tobacco comments:    about 40 yrs ago  Substance Use Topics   Alcohol use: Not Currently    Alcohol/week: 0.0 standard drinks of alcohol     ALLERGIES:   Allergies  Allergen Reactions   Penicillins Hives, Swelling and Other (See Comments)    Ankle swelling Has patient had a PCN reaction causing immediate rash, facial/tongue/throat swelling, SOB or lightheadedness with hypotension: Yes Has patient had a PCN  reaction causing severe rash involving mucus membranes or skin necrosis: No Has patient had a PCN reaction that required hospitalization: No Has patient had a PCN reaction occurring within the last 10 years: No If all of the above answers are NO, then may proceed with Cephalosporin use.     CURRENT MEDICATIONS:   No current facility-administered medications for this encounter.    REVIEW OF SYSTEMS:   [X]  denotes positive finding, [ ]  denotes negative finding Cardiac  Comments:  Chest pain or chest pressure:    Shortness of breath upon exertion:    Short of breath when lying flat:    Irregular heart rhythm:        Vascular    Pain in calf, thigh, or hip brought on by ambulation:    Pain in feet at night that wakes you up from your sleep:     Blood clot in your veins:    Leg swelling:         Pulmonary    Oxygen  at home:    Productive cough:     Wheezing:         Neurologic    Sudden weakness in arms or legs:  Sudden numbness in arms or legs:     Sudden onset of difficulty speaking or slurred speech:    Temporary loss of vision in one eye:     Problems with dizziness:         Gastrointestinal    Blood in stool:     Vomited blood:         Genitourinary    Burning when urinating:     Blood in urine:        Psychiatric    Major depression:         Hematologic    Bleeding problems:    Problems with blood clotting too easily:        Skin    Rashes or ulcers:        Constitutional    Fever or chills:      PHYSICAL EXAM:   Vitals:   10/25/24 0754  BP: (!) 150/79  Pulse: 79  Resp: 12  Temp: 98.3 F (36.8 C)  TempSrc: Oral  SpO2: 98%    GENERAL: The patient is a well-nourished male, in no acute distress. The vital signs are documented above. CARDIAC: There is a regular rate and rhythm.  VASCULAR: Aneurysmal left upper arm fistula mildly pulsatile.  No skin breakdown PULMONARY: Non-labored respirations MUSCULOSKELETAL: There are no major  deformities or cyanosis. NEUROLOGIC: No focal weakness or paresthesias are detected. PSYCHIATRIC: The patient has a normal affect.  STUDIES:     MEDICAL ISSUES:   ESRD: The patient is having trouble with flow rates at dialysis as well as bleeding once the needles are removed.  I discussed that the next step is to proceed with fistulogram to evaluate his fistula for possible stenosis.  If any issues are identified we would address them at that time.  Details of the procedure were discussed with the patient and his wife and he wishes to proceed.  He is also complaining of aneurysmal changes to his fistula.  I discussed that I do not feel that this is large enough to address at this time.    Malvina Serene CLORE, MD, FACS [facility] [phone] Pager [phone]
